# Patient Record
Sex: Female | Born: 1950 | Race: White | Hispanic: No | Marital: Married | Smoking: Never smoker
Health system: Southern US, Community
[De-identification: ages and names within clinical notes are randomized; demographics above are authoritative.]

## PROBLEM LIST (undated history)

## (undated) DIAGNOSIS — M1990 Unspecified osteoarthritis, unspecified site: Secondary | ICD-10-CM

## (undated) DIAGNOSIS — Z8719 Personal history of other diseases of the digestive system: Secondary | ICD-10-CM

## (undated) DIAGNOSIS — Z7901 Long term (current) use of anticoagulants: Secondary | ICD-10-CM

## (undated) DIAGNOSIS — Z9289 Personal history of other medical treatment: Secondary | ICD-10-CM

## (undated) DIAGNOSIS — R42 Dizziness and giddiness: Secondary | ICD-10-CM

## (undated) DIAGNOSIS — M51369 Other intervertebral disc degeneration, lumbar region without mention of lumbar back pain or lower extremity pain: Secondary | ICD-10-CM

## (undated) DIAGNOSIS — M810 Age-related osteoporosis without current pathological fracture: Secondary | ICD-10-CM

## (undated) DIAGNOSIS — I10 Essential (primary) hypertension: Secondary | ICD-10-CM

## (undated) DIAGNOSIS — J189 Pneumonia, unspecified organism: Secondary | ICD-10-CM

## (undated) DIAGNOSIS — Z933 Colostomy status: Secondary | ICD-10-CM

## (undated) DIAGNOSIS — I2699 Other pulmonary embolism without acute cor pulmonale: Secondary | ICD-10-CM

## (undated) DIAGNOSIS — I209 Angina pectoris, unspecified: Secondary | ICD-10-CM

## (undated) DIAGNOSIS — Z22322 Carrier or suspected carrier of Methicillin resistant Staphylococcus aureus: Secondary | ICD-10-CM

## (undated) DIAGNOSIS — K5790 Diverticulosis of intestine, part unspecified, without perforation or abscess without bleeding: Secondary | ICD-10-CM

## (undated) DIAGNOSIS — E119 Type 2 diabetes mellitus without complications: Secondary | ICD-10-CM

## (undated) DIAGNOSIS — E1169 Type 2 diabetes mellitus with other specified complication: Secondary | ICD-10-CM

## (undated) DIAGNOSIS — E669 Obesity, unspecified: Secondary | ICD-10-CM

## (undated) DIAGNOSIS — K5792 Diverticulitis of intestine, part unspecified, without perforation or abscess without bleeding: Secondary | ICD-10-CM

## (undated) DIAGNOSIS — I509 Heart failure, unspecified: Secondary | ICD-10-CM

## (undated) HISTORY — DX: Essential (primary) hypertension: I10

## (undated) HISTORY — PX: CARPAL TUNNEL RELEASE: SHX101

## (undated) HISTORY — PX: KNEE SURGERY: SHX244

---

## 2007-06-15 ENCOUNTER — Emergency Department: Payer: Self-pay | Admitting: Emergency Medicine

## 2007-11-14 ENCOUNTER — Emergency Department: Payer: Self-pay | Admitting: Internal Medicine

## 2008-06-11 ENCOUNTER — Emergency Department: Payer: Self-pay | Admitting: Emergency Medicine

## 2008-06-28 ENCOUNTER — Emergency Department: Payer: Self-pay | Admitting: Emergency Medicine

## 2009-12-29 ENCOUNTER — Ambulatory Visit: Payer: Self-pay | Admitting: Family Medicine

## 2010-04-09 ENCOUNTER — Emergency Department: Payer: Self-pay | Admitting: Emergency Medicine

## 2011-04-19 ENCOUNTER — Emergency Department: Payer: Self-pay | Admitting: *Deleted

## 2011-10-02 ENCOUNTER — Emergency Department: Payer: Self-pay | Admitting: *Deleted

## 2011-10-02 LAB — COMPREHENSIVE METABOLIC PANEL
BUN: 11 mg/dL (ref 7–18)
Chloride: 107 mmol/L (ref 98–107)
Co2: 27 mmol/L (ref 21–32)
Creatinine: 0.92 mg/dL (ref 0.60–1.30)
EGFR (African American): >60
Potassium: 3.7 mmol/L (ref 3.5–5.1)
SGPT (ALT): 88 U/L — ABNORMAL HIGH
Sodium: 142 mmol/L (ref 136–145)
Total Protein: 6.8 g/dL (ref 6.4–8.2)

## 2011-10-02 LAB — URINALYSIS, COMPLETE
Bilirubin,UR: NEGATIVE
Blood: NEGATIVE
Glucose,UR: 50 mg/dL (ref 0–75)
Ketone: NEGATIVE
Nitrite: POSITIVE
Ph: 5 (ref 4.5–8.0)
RBC,UR: 1 /HPF (ref 0–5)
Specific Gravity: 1.014 (ref 1.003–1.030)
Squamous Epithelial: 2

## 2011-10-02 LAB — CBC
HCT: 45 % (ref 35.0–47.0)
MCH: 31.3 pg (ref 26.0–34.0)
Platelet: 138 10*3/uL — ABNORMAL LOW (ref 150–440)
RBC: 4.88 10*6/uL (ref 3.80–5.20)

## 2011-10-02 LAB — TROPONIN I: Troponin-I: <0.02 ng/mL

## 2011-10-02 LAB — CK TOTAL AND CKMB (NOT AT ARMC): CK-MB: <0.5 ng/mL — ABNORMAL LOW (ref 0.5–3.6)

## 2012-01-04 ENCOUNTER — Emergency Department: Payer: Self-pay | Admitting: Emergency Medicine

## 2012-08-19 ENCOUNTER — Emergency Department: Payer: Self-pay | Admitting: Emergency Medicine

## 2012-12-09 ENCOUNTER — Other Ambulatory Visit: Payer: Self-pay | Admitting: Emergency Medicine

## 2012-12-09 LAB — LIPID PANEL
Cholesterol: 136 mg/dL (ref 0–200)
HDL Cholesterol: 35 mg/dL — ABNORMAL LOW (ref 40–60)
Ldl Cholesterol, Calc: 91 mg/dL (ref 0–100)

## 2012-12-09 LAB — COMPREHENSIVE METABOLIC PANEL
BUN: 15 mg/dL (ref 7–18)
Chloride: 109 mmol/L — ABNORMAL HIGH (ref 98–107)
Creatinine: 0.83 mg/dL (ref 0.60–1.30)
EGFR (African American): >60
EGFR (Non-African Amer.): >60
Glucose: 159 mg/dL — ABNORMAL HIGH (ref 65–99)
Osmolality: 284 (ref 275–301)
SGPT (ALT): 79 U/L — ABNORMAL HIGH (ref 12–78)
Sodium: 140 mmol/L (ref 136–145)

## 2012-12-09 LAB — HEMOGLOBIN A1C: Hemoglobin A1C: 7.5 % — ABNORMAL HIGH (ref 4.2–6.3)

## 2012-12-29 ENCOUNTER — Ambulatory Visit: Payer: Self-pay | Admitting: Family Medicine

## 2013-01-21 ENCOUNTER — Emergency Department: Payer: Self-pay | Admitting: Emergency Medicine

## 2013-04-28 ENCOUNTER — Emergency Department: Payer: Self-pay | Admitting: Emergency Medicine

## 2013-04-28 LAB — CBC
HCT: 45.9 % (ref 35.0–47.0)
HGB: 15.2 g/dL (ref 12.0–16.0)
MCH: 30.4 pg (ref 26.0–34.0)
MCV: 92 fL (ref 80–100)
Platelet: 211 10*3/uL (ref 150–440)
RDW: 12.8 % (ref 11.5–14.5)
WBC: 8 10*3/uL (ref 3.6–11.0)

## 2013-04-28 LAB — URINALYSIS, COMPLETE
Bilirubin,UR: NEGATIVE
Blood: NEGATIVE
Glucose,UR: NEGATIVE mg/dL (ref 0–75)
Ph: 7 (ref 4.5–8.0)
Protein: NEGATIVE

## 2013-04-28 LAB — COMPREHENSIVE METABOLIC PANEL
BUN: 16 mg/dL (ref 7–18)
Bilirubin,Total: 0.5 mg/dL (ref 0.2–1.0)
Calcium, Total: 9.2 mg/dL (ref 8.5–10.1)
Chloride: 106 mmol/L (ref 98–107)
Co2: 28 mmol/L (ref 21–32)
Creatinine: 0.86 mg/dL (ref 0.60–1.30)
SGOT(AST): 37 U/L (ref 15–37)
SGPT (ALT): 57 U/L (ref 12–78)
Sodium: 144 mmol/L (ref 136–145)

## 2013-04-28 LAB — TROPONIN I: Troponin-I: <0.02 ng/mL

## 2013-05-29 ENCOUNTER — Observation Stay: Payer: Self-pay | Admitting: Family Medicine

## 2013-05-29 LAB — COMPREHENSIVE METABOLIC PANEL
ANION GAP: 6 — AB (ref 7–16)
Albumin: 3.8 g/dL (ref 3.4–5.0)
Alkaline Phosphatase: 64 U/L
BUN: 17 mg/dL (ref 7–18)
Bilirubin,Total: 0.5 mg/dL (ref 0.2–1.0)
Calcium, Total: 9.4 mg/dL (ref 8.5–10.1)
Chloride: 106 mmol/L (ref 98–107)
Co2: 25 mmol/L (ref 21–32)
Creatinine: 0.96 mg/dL (ref 0.60–1.30)
EGFR (African American): >60
Glucose: 194 mg/dL — ABNORMAL HIGH (ref 65–99)
OSMOLALITY: 281 (ref 275–301)
Potassium: 4.2 mmol/L (ref 3.5–5.1)
SGOT(AST): 33 U/L (ref 15–37)
SGPT (ALT): 54 U/L (ref 12–78)
Sodium: 137 mmol/L (ref 136–145)
Total Protein: 8.2 g/dL (ref 6.4–8.2)

## 2013-05-29 LAB — URINALYSIS, COMPLETE
BLOOD: NEGATIVE
Bilirubin,UR: NEGATIVE
Glucose,UR: NEGATIVE mg/dL (ref 0–75)
KETONE: NEGATIVE
LEUKOCYTE ESTERASE: NEGATIVE
Nitrite: POSITIVE
PH: 6 (ref 4.5–8.0)
Protein: 25
Specific Gravity: 1.015 (ref 1.003–1.030)
Squamous Epithelial: 1
WBC UR: 8 /HPF (ref 0–5)

## 2013-05-29 LAB — CBC WITH DIFFERENTIAL/PLATELET
BASOS ABS: 0 10*3/uL (ref 0.0–0.1)
BASOS PCT: 0.1 %
Eosinophil #: 0.1 10*3/uL (ref 0.0–0.7)
Eosinophil %: 0.8 %
HCT: 49 % — ABNORMAL HIGH (ref 35.0–47.0)
HGB: 16.5 g/dL — ABNORMAL HIGH (ref 12.0–16.0)
Lymphocyte #: 0.4 10*3/uL — ABNORMAL LOW (ref 1.0–3.6)
Lymphocyte %: 3.1 %
MCH: 30.6 pg (ref 26.0–34.0)
MCHC: 33.6 g/dL (ref 32.0–36.0)
MCV: 91 fL (ref 80–100)
MONO ABS: 0.7 x10 3/mm (ref 0.2–0.9)
MONOS PCT: 5.2 %
NEUTROS PCT: 90.8 %
Neutrophil #: 11.7 10*3/uL — ABNORMAL HIGH (ref 1.4–6.5)
PLATELETS: 252 10*3/uL (ref 150–440)
RBC: 5.38 10*6/uL — ABNORMAL HIGH (ref 3.80–5.20)
RDW: 12.7 % (ref 11.5–14.5)
WBC: 12.9 10*3/uL — AB (ref 3.6–11.0)

## 2013-05-29 LAB — LIPASE, BLOOD: Lipase: 152 U/L (ref 73–393)

## 2013-05-30 LAB — CBC WITH DIFFERENTIAL/PLATELET
Basophil #: 0 10*3/uL (ref 0.0–0.1)
Basophil %: 0.2 %
EOS ABS: 0 10*3/uL (ref 0.0–0.7)
EOS PCT: 0.7 %
HCT: 40.8 % (ref 35.0–47.0)
HGB: 13.9 g/dL (ref 12.0–16.0)
LYMPHS PCT: 17.1 %
Lymphocyte #: 0.9 10*3/uL — ABNORMAL LOW (ref 1.0–3.6)
MCH: 31.5 pg (ref 26.0–34.0)
MCHC: 34 g/dL (ref 32.0–36.0)
MCV: 92 fL (ref 80–100)
MONOS PCT: 7.9 %
Monocyte #: 0.4 x10 3/mm (ref 0.2–0.9)
NEUTROS PCT: 74.1 %
Neutrophil #: 4.1 10*3/uL (ref 1.4–6.5)
Platelet: 181 10*3/uL (ref 150–440)
RBC: 4.42 10*6/uL (ref 3.80–5.20)
RDW: 12.7 % (ref 11.5–14.5)
WBC: 5.5 10*3/uL (ref 3.6–11.0)

## 2013-05-30 LAB — BASIC METABOLIC PANEL
ANION GAP: 4 — AB (ref 7–16)
BUN: 11 mg/dL (ref 7–18)
CALCIUM: 7.9 mg/dL — AB (ref 8.5–10.1)
CO2: 27 mmol/L (ref 21–32)
Chloride: 108 mmol/L — ABNORMAL HIGH (ref 98–107)
Creatinine: 0.9 mg/dL (ref 0.60–1.30)
EGFR (African American): >60
Glucose: 98 mg/dL (ref 65–99)
Osmolality: 277 (ref 275–301)
POTASSIUM: 3.6 mmol/L (ref 3.5–5.1)
SODIUM: 139 mmol/L (ref 136–145)

## 2013-05-31 LAB — URINE CULTURE

## 2014-02-14 ENCOUNTER — Ambulatory Visit: Payer: Self-pay | Admitting: Family Medicine

## 2014-02-16 ENCOUNTER — Other Ambulatory Visit: Payer: Self-pay | Admitting: Emergency Medicine

## 2014-02-16 LAB — LIPID PANEL
Cholesterol: 140 mg/dL (ref 0–200)
HDL: 38 mg/dL — AB (ref 40–60)
LDL CHOLESTEROL, CALC: 92 mg/dL (ref 0–100)
Triglycerides: 52 mg/dL (ref 0–200)
VLDL CHOLESTEROL, CALC: 10 mg/dL (ref 5–40)

## 2014-02-16 LAB — HEMOGLOBIN A1C: HEMOGLOBIN A1C: 6.4 % — AB (ref 4.2–6.3)

## 2014-04-17 ENCOUNTER — Ambulatory Visit: Payer: Self-pay | Admitting: Neurosurgery

## 2014-04-20 ENCOUNTER — Ambulatory Visit: Payer: Self-pay | Admitting: Neurosurgery

## 2014-09-14 NOTE — H&P (Signed)
PATIENT NAME:  Kara [name], Kara [name] MR#:  [id number] DATE OF BIRTH:  09-04-1950  DATE OF ADMISSION:  05/29/2013  PRIMARY CARE PHYSICIAN: Dr. Wynetta Emery  REFERRING ER PHYSICIAN: Harvest Dark, MD  CHIEF COMPLAINT: Weakness, diarrhea and vomiting.   HISTORY OF PRESENT ILLNESS: The patient is a 64 year old female with past medical history of diabetes and hypertension who was feeling totally fine until yesterday and today morning around 3:00 in the morning she woke up with severe vomiting and diarrhea. She denies any fever or chills, denies any blood in stool or vomit. She has had a total of 6 to 7 episodes so far and so came to the Emergency Room and found having some UTI also and so came to the Emergency Room. In the ER, she was given symptomatic treatment for diarrhea and vomiting, but did not feel better, not able to tolerate any oral fluid and so hospitalist service contacted. On further questioning, she denies any sick contact, any recent antibiotics or any fever. Says that she is feeling generalized weak now because of repeated diarrhea and vomiting, not able to eat anything since morning.   REVIEW OF SYSTEMS: CONSTITUTIONAL: Negative for fever. Positive for fever and generalized weakness.  EYES: No blurring, double vision, discharge or redness.  EARS, NOSE, THROAT: No tinnitus, ear pain or hearing loss.  RESPIRATORY: No cough, wheezing, hemoptysis or shortness of breath.  CARDIOVASCULAR: No chest pain, orthopnea, edema or arrhythmia.  GASTROINTESTINAL: The patient has nausea, vomiting and diarrhea, but no abdominal pain or blood in the vomit or stool.  GENITOURINARY: No dysuria. No increased frequency of the urine.  ENDOCRINE: No increased sweating. No heat or cold intolerance.  SKIN: No acne, rashes or lesions.  MUSCULOSKELETAL: No pain or swelling in the joints.  NEUROLOGICAL: No numbness, weakness, tremor or vertigo but has generalized weakness.  PSYCHIATRIC: No anxiety, insomnia or bipolar  disorder.   PAST MEDICAL HISTORY: Hypertension, diabetes.   PAST SURGICAL HISTORY: Right knee surgery because of torn ligament 12 years ago.   SOCIAL HISTORY: She is not a smoker, no alcohol drinking and not doing any illegal drug use. Working as a Clinical research associate care person.   FAMILY HISTORY: Mother has diabetes and coronary artery disease.   HOME MEDICATIONS: 1.  Lisinopril 20 mg once a day.  2.  Glipizide 5 mg take 1/2 tablet once a day.  3. Aspirin enteric-coated 81 mg once a day.  PHYSICAL EXAMINATION: VITAL SIGNS: In the ER, temperature 98.6, pulse 102 and went up to 112, respirations 18, blood pressure 149/62 and pulse ox 92 to 93 on room air.  GENERAL: The patient is obese, fully alert and oriented to time, place, and person. Appears slightly sleepy, but opens eyes and talked very nicely. Cooperative with history taking and physical examination.  HEENT: Head and neck atraumatic. Conjunctiva pink. Oral mucosa moist.  NECK: Supple. No JVD.  RESPIRATORY: Bilateral clear and equal air entry.  CARDIOVASCULAR: S1 and S2 present, regular. No murmur.  ABDOMEN: Soft and nontender. Bowel sounds present. No organomegaly.  SKIN: No rashes.  LEGS: No edema.  NEUROLOGIC: Power 5/5. Follows commands. Moves all 4 limbs. No gross abnormality. No costovertebral angle tenderness on the back.  PSYCHIATRIC: Does not appear in any acute psychiatric illness at this time.   IMPORTANT LABORATORY AND DIAGNOSTIC RESULTS: Glucose 194, BUN 17, creatinine 0.96, sodium 137, potassium 4.2, chloride 106, CO2 25. Lipase 152. Calcium 9.4. Total protein 8.2, albumin 3.8, bilirubin 0.5, alkaline phosphate 64, SGOT 33, SGPT  54. WBC 12.9, hemoglobin 16.5, platelet count 252 and MCV 91. Urinalysis is positive with 8 WBC and positive nitrite in the study.   Ultrasound of abdomen is done which showed increased echogenicity consistent with fatty infiltrate in the liver, cholelithiasis without complication factors.    ASSESSMENT AND PLAN: A 64 year old female with past medical history of hypertension and diabetes started having nausea and vomiting and diarrhea since this morning. No abdominal pain or fever. Found having urinary tract infection.  1.  For urinary tract infection, we will give IV Rocephin and will get urine culture.  2.  Gastroenteritis. We will give IV fluids. We will give Zofran and symptomatic support.  3.  Hypertension. We will continue lisinopril as she is taking.  4.  Diabetes. We will continue glipizide and insulin sliding scale coverage.   CODE STATUS: FULL.   TOTAL TIME SPENT ON THIS ADMISSION: 50 minutes.  ___________________________ Ceasar Lund Anselm Jungling, MD vgv:sb D: 05/29/2013 14:54:29 ET T: 05/29/2013 15:07:29 ET JOB#: [id number]  cc: Ceasar Lund. Anselm Jungling, MD, <Dictator> Vaughan Basta MD ELECTRONICALLY SIGNED 05/30/2013 22:22

## 2014-09-14 NOTE — Discharge Summary (Signed)
PATIENT NAME:  Kara [name], Kara [name] MR#:  [id number] DATE OF BIRTH:  [birth year]  DATE OF ADMISSION:  05/29/2013 DATE OF DISCHARGE:  05/30/2013  REASON FOR ADMISSION:  Unable to hold down food or drink secondary to weakness, diarrhea and vomiting.   OTHER MEDICAL PROBLEMS: Include:  1.  Hypertension.  2.  Diabetes.  3.  Obesity.  4.  Chronic cough.   DISPOSITION: Home. Follow up with primary care physician, Dr. Rockne Coons in the next 1 to 2 weeks just as a general followup, or p.r.n. if diarrhea persists.   MEDICATIONS AT DISCHARGE: Aspirin 81 mg daily, glipizide 2.5 mg daily, lisinopril 20 mg daily, promethazine 25 mg every 6 hours as needed for nausea and vomiting, p.r.n. over-the-counter antidiarrheal if needed.   HOSPITAL COURSE: This is a very nice 65 year old female with history of diabetes, hypertension, obesity, who comes after having severe nausea, vomiting and diarrhea starting at 3:00 a.m. the day of admission.   The patient denied any fever or chills, blood in the stool or in the vomit.   The patient had 6 to 7 episodes of vomiting and diarrhea. The diarrhea was just liquid and loose.    The patient was too weak to move around, felt very debilitated, felt very dehydrated, for which the patient was admitted. On admission, it was noticed that she had slight elevation of white blood cells in urine, for which she was started on Rocephin. Urine culture is actually negative, for which she ruled out urinary tract infection; likely this was just contamination from her constant diarrhea.   As far as her gastroenteritis, the patient received IV fluids, Zofran, Phenergan, and now she is better. She had dinner last night, breakfast and lunch today without any further episodes of vomiting. She only had a small episode of loose stool whenever she coughed, but it was not a big bowel movement; it was just small and she feels better right now. The patient has been ambulated without any problem. No  dizziness or lightheadedness. She is ready to be discharged. Her diabetes has been controlled, and again, she is eating without problem.   PHYSICAL EXAMINATION: GENERAL: The patient is and oriented x 3, in no acute distress. No respiratory distress.  VITAL SIGNS:  Blood pressure 108/67, pulse 82, respirations 18, temperature 97.7.  CARDIOVASCULAR: Regular rate and rhythm. No murmurs, rubs or gallops.  LUNGS: Clear without any wheezing or crepitus.  ABDOMEN: Soft, nontender, distended. No hepatosplenomegaly.  EXTREMITIES: No edema, cyanosis or clubbing.   The patient complains of chronic cough that has been going on for several years. She asked for cough syrup.   I recommended to follow up with Dr. Manuella Ghazi, as the patient had been diagnosed with bronchitis a couple of weeks ago. She received treatment with azithromycin, and it seems like the problem persists. Her husband is a heavy smoker, and she seems to be irritated every time that he smokes and she gets more shortness of breath and cough. She could be having COPD due to secondary smoking, or this could be the result of the use of lisinopril as a side effect of cough.   Please follow up with your primary care physician and see if he can change your medication.   I spent about 35 minutes with this discharge.    ____________________________ Angie Sink, MD rsg:dmm D: 05/30/2013 14:31:20 ET T: 05/30/2013 19:24:08 ET JOB#: [id number]  cc: Deloit Sink, MD, <Dictator> [place] MD ELECTRONICALLY SIGNED 06/17/2013 8:08

## 2014-11-06 ENCOUNTER — Ambulatory Visit: Payer: Self-pay | Admitting: Family Medicine

## 2014-11-07 ENCOUNTER — Ambulatory Visit: Payer: Self-pay | Admitting: Family Medicine

## 2014-11-12 ENCOUNTER — Ambulatory Visit (INDEPENDENT_AMBULATORY_CARE_PROVIDER_SITE_OTHER): Payer: 59 | Admitting: Family Medicine

## 2014-11-12 ENCOUNTER — Encounter: Payer: Self-pay | Admitting: Family Medicine

## 2014-11-12 VITALS — BP 130/78 | HR 102 | Temp 98.0°F | Ht 63.0 in | Wt 256.6 lb

## 2014-11-12 DIAGNOSIS — E119 Type 2 diabetes mellitus without complications: Secondary | ICD-10-CM

## 2014-11-12 DIAGNOSIS — E1165 Type 2 diabetes mellitus with hyperglycemia: Secondary | ICD-10-CM | POA: Insufficient documentation

## 2014-11-12 DIAGNOSIS — I10 Essential (primary) hypertension: Secondary | ICD-10-CM | POA: Diagnosis not present

## 2014-11-12 DIAGNOSIS — E669 Obesity, unspecified: Secondary | ICD-10-CM

## 2014-11-12 DIAGNOSIS — E1169 Type 2 diabetes mellitus with other specified complication: Secondary | ICD-10-CM | POA: Insufficient documentation

## 2014-11-12 MED ORDER — LISINOPRIL 20 MG PO TABS: 20.0000 mg | ORAL_TABLET | Freq: Every day | ORAL | Status: DC

## 2014-11-12 MED ORDER — GLIPIZIDE 5 MG PO TABS: 2.5000 mg | ORAL_TABLET | Freq: Every day | ORAL | Status: DC

## 2014-11-12 NOTE — Progress Notes (Signed)
Name: Kara [name]   MRN: [id number]    DOB: 1950-07-17   Date:11/12/2014       Progress Note  Subjective  Chief Complaint  Chief Complaint  Patient presents with  . Follow-up    medication refills    Diabetes She presents for her follow-up diabetic visit. She has type 2 diabetes mellitus. Her disease course has been stable. Associated symptoms include polydipsia and polyuria. Pertinent negatives for diabetes include no chest pain, no fatigue and no foot paresthesias. Symptoms are stable. Pertinent negatives for diabetic complications include no autonomic neuropathy. Risk factors for coronary artery disease include diabetes mellitus and obesity. Current diabetic treatment includes oral agent (monotherapy). She is following a diabetic and generally healthy diet. She rarely participates in exercise. Her breakfast blood glucose is taken between 6-7 am. Her breakfast blood glucose range is generally 110-130 mg/dl. An ACE inhibitor/angiotensin II receptor blocker is being taken.  Hypertension This is a chronic problem. The problem is controlled. Pertinent negatives include no chest pain, palpitations or shortness of breath. Past treatments include ACE inhibitors. The current treatment provides significant improvement. Compliance problems include diet.  There is no history of angina or kidney disease.      Past Medical History  Diagnosis Date  . Diabetes mellitus without complication   . Hypertension     History reviewed. No pertinent past surgical history.  Family History  Problem Relation Age of Onset  . Diabetes Mother   . Heart disease Mother   . Cancer Mother   . Heart disease Father   . Diabetes Sister   . Heart disease Sister     History   Social History  . Marital Status: Married    Spouse Name: N/A  . Number of Children: N/A  . Years of Education: N/A   Occupational History  . Not on file.   Social History Main Topics  . Smoking status: Never Smoker   .  Smokeless tobacco: Not on file  . Alcohol Use: No  . Drug Use: No  . Sexual Activity: Not on file   Other Topics Concern  . Not on file   Social History Narrative  . No narrative on file     Current outpatient prescriptions:  .  aspirin EC 81 MG tablet, Take 81 mg by mouth daily., Disp: , Rfl:  .  Cinnamon 500 MG capsule, Take 500 mg by mouth daily., Disp: , Rfl:  .  glipiZIDE (GLUCOTROL) 5 MG tablet, Take 0.5 tablets (2.5 mg total) by mouth daily before breakfast., Disp: 90 tablet, Rfl: 0 .  lisinopril (PRINIVIL,ZESTRIL) 20 MG tablet, Take 1 tablet (20 mg total) by mouth daily., Disp: 90 tablet, Rfl: 0 .  MULTIPLE VITAMIN IV, Take by mouth., Disp: , Rfl:   Not on File   Review of Systems  Constitutional: Negative for fatigue.  Respiratory: Negative for shortness of breath.   Cardiovascular: Negative for chest pain and palpitations.  Endo/Heme/Allergies: Positive for polydipsia.      Objective  Filed Vitals:   11/12/14 1613  BP: 130/78  Pulse: 102  Temp: 98 F (36.7 C)  TempSrc: Oral  Height: '5\' 3"'$  (1.6 m)  Weight: 256 lb 9.6 oz (116.393 kg)  SpO2: 97%    Physical Exam  Constitutional: She is oriented to person, place, and time and well-developed, well-nourished, and in no distress.  HENT:  Head: Normocephalic and atraumatic.  Cardiovascular: Normal rate and regular rhythm.   Pulmonary/Chest: Effort normal and breath sounds  normal.  Musculoskeletal:       Right ankle: She exhibits swelling and ecchymosis.       Left ankle: She exhibits no swelling.  Neurological: She is alert and oriented to person, place, and time.  Psychiatric: Affect normal.  Nursing note and vitals reviewed.      No results found for this or any previous visit (from the past 2160 hour(s)).   Assessment & Plan 1. Diabetes mellitus type 2 in obese  - HgB A1c - Lipid panel - Comprehensive metabolic panel - glipiZIDE (GLUCOTROL) 5 MG tablet; Take 0.5 tablets (2.5 mg total) by  mouth daily before breakfast.  Dispense: 90 tablet; Refill: 0  2. Essential hypertension  - lisinopril (PRINIVIL,ZESTRIL) 20 MG tablet; Take 1 tablet (20 mg total) by mouth daily.  Dispense: 90 tablet; Refill: 0   Amareon Phung [facility] 11/12/2014 4:42 PM

## 2014-11-18 ENCOUNTER — Other Ambulatory Visit
Admission: RE | Admit: 2014-11-18 | Discharge: 2014-11-18 | Disposition: A | Discharge status: Home / Self Care | Payer: 59 | Source: Ambulatory Visit | Attending: Family Medicine | Admitting: Family Medicine

## 2014-11-18 DIAGNOSIS — E119 Type 2 diabetes mellitus without complications: Secondary | ICD-10-CM | POA: Insufficient documentation

## 2014-11-18 LAB — COMPREHENSIVE METABOLIC PANEL
ALK PHOS: 51 U/L (ref 38–126)
ALT: 28 U/L (ref 14–54)
AST: 21 U/L (ref 15–41)
Albumin: 3.7 g/dL (ref 3.5–5.0)
Anion gap: 7 (ref 5–15)
BUN: 17 mg/dL (ref 6–20)
CO2: 26 mmol/L (ref 22–32)
CREATININE: 0.65 mg/dL (ref 0.44–1.00)
Calcium: 8.8 mg/dL — ABNORMAL LOW (ref 8.9–10.3)
Chloride: 107 mmol/L (ref 101–111)
GFR calc non Af Amer: >60 mL/min (ref >60)
GLUCOSE: 152 mg/dL — AB (ref 65–99)
POTASSIUM: 4.1 mmol/L (ref 3.5–5.1)
Sodium: 140 mmol/L (ref 135–145)
Total Bilirubin: 0.7 mg/dL (ref 0.3–1.2)
Total Protein: 7 g/dL (ref 6.5–8.1)

## 2014-11-18 LAB — LIPID PANEL
CHOLESTEROL: 132 mg/dL (ref 0–200)
HDL: 44 mg/dL (ref >40)
LDL CALC: 82 mg/dL (ref 0–99)
Total CHOL/HDL Ratio: 3 RATIO
Triglycerides: 29 mg/dL (ref <150)
VLDL: 6 mg/dL (ref 0–40)

## 2014-11-19 ENCOUNTER — Telehealth: Payer: Self-pay | Admitting: Family Medicine

## 2014-11-19 LAB — HEMOGLOBIN A1C: Hgb A1c MFr Bld: 6.6 % — ABNORMAL HIGH (ref 4.0–6.0)

## 2014-11-19 NOTE — Telephone Encounter (Signed)
PT IS ASKING FOR HER LAB RESULTS SHE HAD DONE A DAY OR 2 AGO. PLEASE CALL.

## 2014-11-19 NOTE — Progress Notes (Signed)
Called Pt, LM of lab results.

## 2014-11-22 NOTE — Telephone Encounter (Signed)
Called and LM with the normal labs for the second time.

## 2014-12-03 ENCOUNTER — Ambulatory Visit (INDEPENDENT_AMBULATORY_CARE_PROVIDER_SITE_OTHER): Payer: 59 | Admitting: Family Medicine

## 2014-12-03 ENCOUNTER — Encounter: Payer: Self-pay | Admitting: Family Medicine

## 2014-12-03 VITALS — BP 126/84 | HR 110 | Temp 99.0°F | Resp 18 | Ht 63.0 in | Wt 253.0 lb

## 2014-12-03 DIAGNOSIS — J209 Acute bronchitis, unspecified: Secondary | ICD-10-CM | POA: Insufficient documentation

## 2014-12-03 MED ORDER — GUAIFENESIN-CODEINE 100-10 MG/5ML PO SYRP: 10.0000 mL | ORAL_SOLUTION | Freq: Four times a day (QID) | ORAL | Status: DC | PRN

## 2014-12-03 MED ORDER — AZITHROMYCIN 250 MG PO TABS: ORAL_TABLET | ORAL | Status: DC

## 2014-12-03 NOTE — Progress Notes (Signed)
Name: Kara [name]   MRN: [id number]    DOB: [birth year]   Date:12/03/2014       Progress Note  Subjective  Chief Complaint  Chief Complaint  Patient presents with  . Cough    intense cough since yesterday, low grade fever,headache, and SOB    Cough This is a new problem. The current episode started yesterday. The problem has been gradually worsening. The cough is non-productive. Associated symptoms include chills, headaches and wheezing. Pertinent negatives include no chest pain, ear pain, fever, hemoptysis, nasal congestion, sore throat or shortness of breath. She has tried nothing for the symptoms. There is no history of COPD, emphysema, environmental allergies or pneumonia.      Past Medical History  Diagnosis Date  . Diabetes mellitus without complication   . Hypertension     Past Surgical History  Procedure Laterality Date  . Knee surgery      torn menicus  . Carpal tunnel release Bilateral     Family History  Problem Relation Age of Onset  . Diabetes Mother   . Heart disease Mother   . Cancer Mother   . Heart disease Father   . Diabetes Sister   . Heart disease Sister     History   Social History  . Marital Status: Married    Spouse Name: N/A  . Number of Children: N/A  . Years of Education: N/A   Occupational History  . Not on file.   Social History Main Topics  . Smoking status: Never Smoker   . Smokeless tobacco: Never Used  . Alcohol Use: No  . Drug Use: No  . Sexual Activity: No   Other Topics Concern  . Not on file   Social History Narrative     Current outpatient prescriptions:  .  aspirin EC 81 MG tablet, Take 81 mg by mouth daily., Disp: , Rfl:  .  Cinnamon 500 MG capsule, Take 500 mg by mouth daily., Disp: , Rfl:  .  glipiZIDE (GLUCOTROL) 5 MG tablet, Take 0.5 tablets (2.5 mg total) by mouth daily before breakfast., Disp: 90 tablet, Rfl: 0 .  lisinopril (PRINIVIL,ZESTRIL) 20 MG tablet, Take 1 tablet (20 mg total) by mouth  daily., Disp: 90 tablet, Rfl: 0 .  MULTIPLE VITAMIN IV, Take by mouth., Disp: , Rfl:   No Known Allergies   Review of Systems  Constitutional: Positive for chills. Negative for fever.  HENT: Negative for congestion, ear pain and sore throat.   Respiratory: Positive for cough and wheezing. Negative for hemoptysis and shortness of breath.   Cardiovascular: Negative for chest pain.  Neurological: Positive for headaches.  Endo/Heme/Allergies: Negative for environmental allergies.      Objective  Filed Vitals:   12/03/14 1023  BP: 126/84  Pulse: 110  Temp: 99 F (37.2 C)  TempSrc: Oral  Resp: 18  Height: '5\' 3"'$  (1.6 m)  Weight: 253 lb (114.76 kg)  SpO2: 96%    Physical Exam  Constitutional: She is well-developed, well-nourished, and in no distress.  HENT:  Head: Normocephalic and atraumatic.  Right Ear: External ear and ear canal normal.  Left Ear: Tympanic membrane, external ear and ear canal normal.  Nose: Mucosal edema and rhinorrhea present.  Mouth/Throat: Posterior oropharyngeal erythema present. No oropharyngeal exudate or posterior oropharyngeal edema.  Cardiovascular: Normal rate and regular rhythm.   Pulmonary/Chest: Effort normal and breath sounds normal.  Nursing note and vitals reviewed.   Assessment & Plan 1. Acute bronchitis, unspecified organism  Daily bronchitis, early onset. Recommended conservative treatment but provided a prescription for Z-Pak if her symptoms are not improving within 2-3 days, she is to start on the antibiotics. Patient verbalized understanding. - guaiFENesin-codeine (ROBITUSSIN AC) 100-10 MG/5ML syrup; Take 10 mLs by mouth 4 (four) times daily as needed for cough.  Dispense: 200 mL; Refill: 0 - azithromycin (ZITHROMAX Z-PAK) 250 MG tablet; 2 tabs po x day 1, then 1 tab po qday x 4 days.  Dispense: 6 each; Refill: 0   Kaelen Brennan [facility] 12/03/2014 10:53 AM

## 2014-12-09 ENCOUNTER — Telehealth: Payer: Self-pay | Admitting: Family Medicine

## 2014-12-09 DIAGNOSIS — J209 Acute bronchitis, unspecified: Secondary | ICD-10-CM

## 2014-12-09 MED ORDER — HYDROCOD POLST-CPM POLST ER 10-8 MG/5ML PO SUER: 5.0000 mL | Freq: Every evening | ORAL | Status: DC | PRN

## 2014-12-09 MED ORDER — PREDNISONE 10 MG PO TABS: 10.0000 mg | ORAL_TABLET | Freq: Every day | ORAL | Status: DC

## 2014-12-09 NOTE — Telephone Encounter (Signed)
Patient was seen on 12/03/2014 she was prescribed a Zpack patient states it gave her diarrhea so she stopped taking it and is now requesting Tussinex and Predinsone be sent to [facility]. Routed to Dr. Manuella Ghazi for review and approval.

## 2014-12-09 NOTE — Telephone Encounter (Signed)
Patient started having diarrhea after 2 days of Z-Pak so she discontinued taking it. She still has persistent coughing despite taking Tylenol with codeine. We will start patient on Tussionex and prednisone 50 mg tapering dose over 5 days.

## 2014-12-09 NOTE — Telephone Encounter (Signed)
Was seen on last Tuesday but the zpack gave diarrhea so she stopped taking it. Now patient is requesting Tussinex and predinsone. Patient is still coughing at night. Please send to [facility]. Patient requesting that you call her once this is completed.

## 2014-12-10 ENCOUNTER — Telehealth: Payer: Self-pay | Admitting: Family Medicine

## 2014-12-10 NOTE — Telephone Encounter (Signed)
Spoke with patient to let her know prescription is here and ready for pickup, patient stated that her husband has an appointment today and he will pick up for her, his name is Juan Tavis.

## 2014-12-29 ENCOUNTER — Emergency Department
Admission: EM | Admit: 2014-12-29 | Discharge: 2014-12-29 | Disposition: A | Discharge status: Home / Self Care | Payer: 59 | Attending: Emergency Medicine | Admitting: Emergency Medicine

## 2014-12-29 ENCOUNTER — Encounter: Payer: Self-pay | Admitting: Emergency Medicine

## 2014-12-29 DIAGNOSIS — Y9389 Activity, other specified: Secondary | ICD-10-CM | POA: Insufficient documentation

## 2014-12-29 DIAGNOSIS — Y9289 Other specified places as the place of occurrence of the external cause: Secondary | ICD-10-CM | POA: Insufficient documentation

## 2014-12-29 DIAGNOSIS — Y998 Other external cause status: Secondary | ICD-10-CM | POA: Insufficient documentation

## 2014-12-29 DIAGNOSIS — Z791 Long term (current) use of non-steroidal anti-inflammatories (NSAID): Secondary | ICD-10-CM | POA: Diagnosis not present

## 2014-12-29 DIAGNOSIS — E119 Type 2 diabetes mellitus without complications: Secondary | ICD-10-CM | POA: Diagnosis not present

## 2014-12-29 DIAGNOSIS — Z7982 Long term (current) use of aspirin: Secondary | ICD-10-CM | POA: Diagnosis not present

## 2014-12-29 DIAGNOSIS — I10 Essential (primary) hypertension: Secondary | ICD-10-CM | POA: Insufficient documentation

## 2014-12-29 DIAGNOSIS — Z79899 Other long term (current) drug therapy: Secondary | ICD-10-CM | POA: Diagnosis not present

## 2014-12-29 DIAGNOSIS — S80921A Unspecified superficial injury of right lower leg, initial encounter: Secondary | ICD-10-CM | POA: Diagnosis present

## 2014-12-29 DIAGNOSIS — T148XXA Other injury of unspecified body region, initial encounter: Secondary | ICD-10-CM

## 2014-12-29 DIAGNOSIS — S8491XA Injury of unspecified nerve at lower leg level, right leg, initial encounter: Secondary | ICD-10-CM | POA: Diagnosis not present
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
## 2014-12-29 NOTE — Discharge Instructions (Signed)
Contusion °A contusion is a deep bruise. Contusions are the result of an injury that caused bleeding under the skin. The contusion may turn blue, purple, or yellow. Minor injuries will give you a painless contusion, but more severe contusions may stay painful and swollen for a few weeks.  °CAUSES  °A contusion is usually caused by a blow, trauma, or direct force to an area of the body. °SYMPTOMS  °· Swelling and redness of the injured area. °· Bruising of the injured area. °· Tenderness and soreness of the injured area. °· Pain. °DIAGNOSIS  °The diagnosis can be made by taking a history and physical exam. An X-ray, CT scan, or MRI may be needed to determine if there were any associated injuries, such as fractures. °TREATMENT  °Specific treatment will depend on what area of the body was injured. In general, the best treatment for a contusion is resting, icing, elevating, and applying cold compresses to the injured area. Over-the-counter medicines may also be recommended for pain control. Ask your caregiver what the best treatment is for your contusion. °HOME CARE INSTRUCTIONS  °· Put ice on the injured area. °¨ Put ice in a plastic bag. °¨ Place a towel between your skin and the bag. °¨ Leave the ice on for 15-20 minutes, 3-4 times a day, or as directed by your health care provider. °· Only take over-the-counter or prescription medicines for pain, discomfort, or fever as directed by your caregiver. Your caregiver may recommend avoiding anti-inflammatory medicines (aspirin, ibuprofen, and naproxen) for 48 hours because these medicines may increase bruising. °· Rest the injured area. °· If possible, elevate the injured area to reduce swelling. °SEEK IMMEDIATE MEDICAL CARE IF:  °· You have increased bruising or swelling. °· You have pain that is getting worse. °· Your swelling or pain is not relieved with medicines. °MAKE SURE YOU:  °· Understand these instructions. °· Will watch your condition. °· Will get help right  away if you are not doing well or get worse. °Document Released: 02/17/2005 Document Revised: 05/15/2013 Document Reviewed: 03/15/2011 °[employer]® Patient Information ©2015 [employer]. This information is not intended to replace advice given to you by your health care provider. Make sure you discuss any questions you have with your health care provider. ° °
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
## 2014-12-29 NOTE — ED Notes (Signed)
MD at bedside with RN.

## 2015-02-04 ENCOUNTER — Telehealth: Payer: Self-pay | Admitting: Family Medicine

## 2015-02-04 DIAGNOSIS — I10 Essential (primary) hypertension: Secondary | ICD-10-CM

## 2015-02-04 MED ORDER — LISINOPRIL 20 MG PO TABS: 20.0000 mg | ORAL_TABLET | Freq: Every day | ORAL | Status: DC

## 2015-02-04 NOTE — Telephone Encounter (Signed)
Medication has been refilled and sent to [place]

## 2015-02-12 ENCOUNTER — Ambulatory Visit: Payer: 59 | Admitting: Family Medicine

## 2015-02-20 ENCOUNTER — Ambulatory Visit (INDEPENDENT_AMBULATORY_CARE_PROVIDER_SITE_OTHER): Payer: 59 | Admitting: Family Medicine

## 2015-02-20 ENCOUNTER — Encounter: Payer: Self-pay | Admitting: Family Medicine

## 2015-02-20 VITALS — BP 140/82 | HR 120 | Temp 97.7°F | Resp 20 | Ht 63.0 in | Wt 258.4 lb

## 2015-02-20 DIAGNOSIS — G4762 Sleep related leg cramps: Secondary | ICD-10-CM

## 2015-02-20 DIAGNOSIS — E119 Type 2 diabetes mellitus without complications: Secondary | ICD-10-CM

## 2015-02-20 DIAGNOSIS — I10 Essential (primary) hypertension: Secondary | ICD-10-CM

## 2015-02-20 LAB — POCT GLYCOSYLATED HEMOGLOBIN (HGB A1C): Hemoglobin A1C: 6.6

## 2015-02-20 MED ORDER — LISINOPRIL 20 MG PO TABS: 20.0000 mg | ORAL_TABLET | Freq: Every day | ORAL | Status: DC

## 2015-02-20 NOTE — Progress Notes (Signed)
Name: Kara [name]   MRN: [id number]    DOB: 1951/02/28   Date:02/20/2015       Progress Note  Subjective  Chief Complaint  Chief Complaint  Patient presents with  . Follow-up    3 mo  . Diabetes    Diabetes She presents for her follow-up diabetic visit. She has type 2 diabetes mellitus. Her disease course has been stable. Hypoglycemia symptoms include dizziness, nervousness/anxiousness and sweats. Pertinent negatives for hypoglycemia include no headaches. Pertinent negatives for diabetes include no blurred vision, no fatigue, no foot paresthesias, no polydipsia and no polyuria. There are no hypoglycemic complications. Diabetic complications include autonomic neuropathy. Pertinent negatives for diabetic complications include no CVA. Her weight is stable. Her breakfast blood glucose range is generally 110-130 mg/dl.  Hypertension This is a chronic problem. The problem is unchanged. The problem is controlled. Associated symptoms include sweats. Pertinent negatives include no blurred vision, headaches or palpitations. Past treatments include ACE inhibitors. There is no history of angina, CAD/MI or CVA.    Past Medical History  Diagnosis Date  . Diabetes mellitus without complication   . Hypertension     Past Surgical History  Procedure Laterality Date  . Knee surgery      torn menicus  . Carpal tunnel release Bilateral     Family History  Problem Relation Age of Onset  . Diabetes Mother   . Heart disease Mother   . Cancer Mother   . Heart disease Father   . Diabetes Sister   . Heart disease Sister     Social History   Social History  . Marital Status: Married    Spouse Name: N/A  . Number of Children: N/A  . Years of Education: N/A   Occupational History  . Not on file.   Social History Main Topics  . Smoking status: Never Smoker   . Smokeless tobacco: Never Used  . Alcohol Use: No  . Drug Use: No  . Sexual Activity: No   Other Topics Concern  . Not  on file   Social History Narrative    Current outpatient prescriptions:  .  aspirin EC 81 MG tablet, Take 81 mg by mouth daily., Disp: , Rfl:  .  azithromycin (ZITHROMAX Z-PAK) 250 MG tablet, 2 tabs po x day 1, then 1 tab po qday x 4 days., Disp: 6 each, Rfl: 0 .  chlorpheniramine-HYDROcodone (TUSSIONEX PENNKINETIC ER) 10-8 MG/5ML SUER, Take 5 mLs by mouth at bedtime as needed for cough., Disp: 25 mL, Rfl: 0 .  Cinnamon 500 MG capsule, Take 500 mg by mouth daily., Disp: , Rfl:  .  glipiZIDE (GLUCOTROL) 5 MG tablet, Take 0.5 tablets (2.5 mg total) by mouth daily before breakfast., Disp: 90 tablet, Rfl: 0 .  guaiFENesin-codeine (ROBITUSSIN AC) 100-10 MG/5ML syrup, Take 10 mLs by mouth 4 (four) times daily as needed for cough., Disp: 200 mL, Rfl: 0 .  lisinopril (PRINIVIL,ZESTRIL) 20 MG tablet, Take 1 tablet (20 mg total) by mouth daily., Disp: 30 tablet, Rfl: 0 .  MULTIPLE VITAMIN IV, Take by mouth., Disp: , Rfl:  .  predniSONE (DELTASONE) 10 MG tablet, Take 1 tablet (10 mg total) by mouth daily with breakfast. 50 40 '30 20 10 '$ then STOP, Disp: 15 tablet, Rfl: 0  No Known Allergies   Review of Systems  Constitutional: Negative for fatigue.  Eyes: Negative for blurred vision.  Cardiovascular: Negative for palpitations.  Musculoskeletal: Positive for myalgias.  Neurological: Positive for dizziness. Negative for  headaches.  Endo/Heme/Allergies: Negative for polydipsia.  Psychiatric/Behavioral: The patient is nervous/anxious.     Objective  Filed Vitals:   02/20/15 1622  BP: 140/82  Pulse: 120  Temp: 97.7 F (36.5 C)  TempSrc: Oral  Resp: 20  Height: '5\' 3"'$  (1.6 m)  Weight: 258 lb 6.4 oz (117.209 kg)  SpO2: 96%    Physical Exam  Constitutional: She is oriented to person, place, and time and well-developed, well-nourished, and in no distress.  Cardiovascular: Normal rate and regular rhythm.   Pulmonary/Chest: Effort normal and breath sounds normal.  Musculoskeletal:       Right  lower leg: She exhibits tenderness. She exhibits no swelling and no edema.  Neurological: She is alert and oriented to person, place, and time.  Nursing note and vitals reviewed.   Recent Results (from the past 2160 hour(s))  POCT HgB A1C     Status: Normal   Collection Time: 02/20/15  4:29 PM  Result Value Ref Range   Hemoglobin A1C 6.6    Assessment & Plan  1. Type 2 diabetes mellitus without complication  - POCT HgB A1C  2. Essential hypertension  - lisinopril (PRINIVIL,ZESTRIL) 20 MG tablet; Take 1 tablet (20 mg total) by mouth daily.  Dispense: 90 tablet; Refill: 0  3. Nocturnal leg cramps  - Comprehensive Metabolic Panel (CMET) - Magnesium    Syed Asad A[facility] 02/20/2015 5:25 PM

## 2015-02-25 ENCOUNTER — Telehealth: Payer: Self-pay | Admitting: Family Medicine

## 2015-02-25 NOTE — Telephone Encounter (Signed)
Returned patient's call and results have been reported

## 2015-02-25 NOTE — Telephone Encounter (Signed)
Patient received call yesterday and missed the call. Not sure what the call was about

## 2015-03-17 ENCOUNTER — Other Ambulatory Visit
Admission: RE | Admit: 2015-03-17 | Discharge: 2015-03-17 | Disposition: A | Discharge status: Home / Self Care | Payer: 59 | Source: Ambulatory Visit | Attending: Family Medicine | Admitting: Family Medicine

## 2015-03-17 ENCOUNTER — Telehealth: Payer: Self-pay | Admitting: Family Medicine

## 2015-03-17 DIAGNOSIS — M5416 Radiculopathy, lumbar region: Secondary | ICD-10-CM

## 2015-03-17 DIAGNOSIS — G4762 Sleep related leg cramps: Secondary | ICD-10-CM | POA: Insufficient documentation

## 2015-03-17 LAB — COMPREHENSIVE METABOLIC PANEL
ALT: 37 U/L (ref 14–54)
ANION GAP: 7 (ref 5–15)
AST: 24 U/L (ref 15–41)
Albumin: 4.1 g/dL (ref 3.5–5.0)
Alkaline Phosphatase: 47 U/L (ref 38–126)
BILIRUBIN TOTAL: 0.8 mg/dL (ref 0.3–1.2)
BUN: 17 mg/dL (ref 6–20)
CALCIUM: 8.9 mg/dL (ref 8.9–10.3)
CO2: 24 mmol/L (ref 22–32)
CREATININE: 0.76 mg/dL (ref 0.44–1.00)
Chloride: 111 mmol/L (ref 101–111)
GFR calc non Af Amer: >60 mL/min (ref >60)
Glucose, Bld: 123 mg/dL — ABNORMAL HIGH (ref 65–99)
Potassium: 3.8 mmol/L (ref 3.5–5.1)
SODIUM: 142 mmol/L (ref 135–145)
TOTAL PROTEIN: 7 g/dL (ref 6.5–8.1)

## 2015-03-17 LAB — MAGNESIUM: MAGNESIUM: 2.1 mg/dL (ref 1.7–2.4)

## 2015-03-17 NOTE — Telephone Encounter (Signed)
Pt would like a call back please.

## 2015-03-18 MED ORDER — PREDNISONE 10 MG PO TABS: 10.0000 mg | ORAL_TABLET | Freq: Every day | ORAL | Status: DC

## 2015-03-18 NOTE — Telephone Encounter (Signed)
Patient has pain in her back that is going down her leg. Would like something to help relieve the pain that is going down her leg because Dr Jerene Bears is out of town and will not be back until the 5th of November. She is just needing a little help.

## 2015-03-18 NOTE — Telephone Encounter (Signed)
Patient has back pain radiating down to her right leg. In the past she has received cortisone injections which have helped significantly. Her orthopedist is currently out of town. She is requesting something to relieve the pain until she is able to see her orthopedist. We will prescribe prednisone 50 mg and patient will taper over 5 days. Advised to check her sugars while on prednisone.

## 2015-03-20 NOTE — Telephone Encounter (Signed)
Medication has been sent to pharmacy on 03/18/2015 per Dr. Manuella Ghazi

## 2015-03-26 ENCOUNTER — Other Ambulatory Visit: Payer: Self-pay | Admitting: Family Medicine

## 2015-03-26 DIAGNOSIS — M5416 Radiculopathy, lumbar region: Secondary | ICD-10-CM

## 2015-03-26 MED ORDER — PREDNISONE 10 MG PO TABS: 10.0000 mg | ORAL_TABLET | Freq: Every day | ORAL | Status: DC

## 2015-03-26 NOTE — Telephone Encounter (Signed)
Patient reports her injection is not until November 17 and hip has started bothering her again. She is requesting prednisone until her appointment for injection. We will approve one more round of tapering dose of prednisone.

## 2015-03-26 NOTE — Telephone Encounter (Signed)
Routed to Dr. Shah for approval 

## 2015-03-26 NOTE — Telephone Encounter (Signed)
Pt states she would like a refill on her prednisone. She is scheduled to see Dr Sherwood Gambler on 04/10/15 for her injection. [place]

## 2015-05-26 ENCOUNTER — Encounter: Payer: Self-pay | Admitting: Emergency Medicine

## 2015-05-26 ENCOUNTER — Emergency Department
Admission: EM | Admit: 2015-05-26 | Discharge: 2015-05-26 | Disposition: A | Discharge status: Home / Self Care | Payer: 59 | Attending: Emergency Medicine | Admitting: Emergency Medicine

## 2015-05-26 DIAGNOSIS — I10 Essential (primary) hypertension: Secondary | ICD-10-CM | POA: Insufficient documentation

## 2015-05-26 DIAGNOSIS — E119 Type 2 diabetes mellitus without complications: Secondary | ICD-10-CM | POA: Diagnosis not present

## 2015-05-26 DIAGNOSIS — J209 Acute bronchitis, unspecified: Secondary | ICD-10-CM

## 2015-05-26 DIAGNOSIS — Z79899 Other long term (current) drug therapy: Secondary | ICD-10-CM | POA: Diagnosis not present

## 2015-05-26 DIAGNOSIS — Z7982 Long term (current) use of aspirin: Secondary | ICD-10-CM | POA: Diagnosis not present

## 2015-05-26 DIAGNOSIS — R05 Cough: Secondary | ICD-10-CM | POA: Diagnosis present

## 2015-05-26 MED ORDER — FLUTICASONE PROPIONATE 50 MCG/ACT NA SUSP: 1.0000 | Freq: Two times a day (BID) | NASAL | Status: DC

## 2015-05-26 MED ORDER — PREDNISONE 10 MG PO TABS: 10.0000 mg | ORAL_TABLET | ORAL | Status: DC

## 2015-05-26 MED ORDER — HYDROCOD POLST-CPM POLST ER 10-8 MG/5ML PO SUER: 5.0000 mL | Freq: Two times a day (BID) | ORAL | Status: DC

## 2015-05-26 MED ORDER — ALBUTEROL SULFATE HFA 108 (90 BASE) MCG/ACT IN AERS: 2.0000 | INHALATION_SPRAY | RESPIRATORY_TRACT | Status: DC | PRN

## 2015-05-26 NOTE — ED Notes (Signed)
Pt presents to ED with cough, congestion that started yesterday. Pt states she has some chills but has been taking tylenol off and on. Occasionally has body aches , denies n/v. Chest hurts due to coughing so much.

## 2015-05-26 NOTE — ED Provider Notes (Signed)
[facility] Emergency Department Provider Note ?  ? ____________________________________________ ? Time seen: 4:14 PM ? I have reviewed the triage vital signs and the nursing notes.  ________ HISTORY ? Chief Complaint Cough and Nasal Congestion     HPI  Kara [name] is a 65 y.o. female   who presents emergency department complaining of nasal congestion and cough that began yesterday. Patient states that she has a dry cough that is "really bothering me." Patient states that she is call somewhat she has some rib pain. But she denies any chest pain. She does endorse some mild shortness of breath after coughing spells but generally didn't climb to any shortness of breath. She denies any fevers or chills. She denies any headache, neck pain, nausea or vomiting. ? ? ? Past Medical History  Diagnosis Date  . Diabetes mellitus without complication ([place])   . Hypertension     Patient Active Problem List   Diagnosis Date Noted  . Nocturnal leg cramps 02/20/2015  . Diabetes mellitus type 2 in obese ([place]) 11/12/2014  . BP (high blood pressure) 11/12/2014   ? Past Surgical History  Procedure Laterality Date  . Knee surgery      torn menicus  . Carpal tunnel release Bilateral    ? Current Outpatient Rx  Name  Route  Sig  Dispense  Refill  . albuterol (PROVENTIL HFA;VENTOLIN HFA) 108 (90 Base) MCG/ACT inhaler   Inhalation   Inhale 2 puffs into the lungs every 4 (four) hours as needed for wheezing or shortness of breath.   1 Inhaler   0   . aspirin EC 81 MG tablet   Oral   Take 81 mg by mouth daily.         . chlorpheniramine-HYDROcodone (TUSSIONEX PENNKINETIC ER) 10-8 MG/5ML SUER   Oral   Take 5 mLs by mouth 2 (two) times daily.   140 mL   0   . Cinnamon 500 MG capsule   Oral   Take 500 mg by mouth daily.         . fluticasone (FLONASE) 50 MCG/ACT nasal spray   Each Nare   Place 1 spray into both nostrils 2 (two) times daily.    16 g   0   . glipiZIDE (GLUCOTROL) 5 MG tablet   Oral   Take 0.5 tablets (2.5 mg total) by mouth daily before breakfast.   90 tablet   0   . lisinopril (PRINIVIL,ZESTRIL) 20 MG tablet   Oral   Take 1 tablet (20 mg total) by mouth daily.   90 tablet   0   . MULTIPLE VITAMIN IV   Oral   Take by mouth.         . predniSONE (DELTASONE) 10 MG tablet   Oral   Take 1 tablet (10 mg total) by mouth as directed.   21 tablet   0     Take on a daily basis of 6, 5, 4, 3, 2, 1    ? Allergies Review of patient's allergies indicates no known allergies. ? Family History  Problem Relation Age of Onset  . Diabetes Mother   . Heart disease Mother   . Cancer Mother   . Heart disease Father   . Diabetes Sister   . Heart disease Sister    ? Social History Social History  Substance Use Topics  . Smoking status: Never Smoker   . Smokeless tobacco: Never Used  . Alcohol Use: No   ?  Review of Systems Constitutional: no fever. Eyes: no discharge ENT: no sore throat. Has  nasal congestion Cardiovascular: no chest pain. Respiratory: Positive cough. No sob Gastrointestinal: denies abdominal pain, vomiting, diarrhea, and constipation Genitourinary: no dysuria. Negative for hematuria Musculoskeletal: Negative for back pain. Skin: Negative for rash. Neurological: Negative for headaches  10-point ROS otherwise negative.  _______________ PHYSICAL EXAM: ? VITAL SIGNS:   ED Triage Vitals  Enc Vitals Group     BP 05/26/15 1529 145/63 mmHg     Pulse Rate 05/26/15 1529 86     Resp 05/26/15 1529 18     Temp 05/26/15 1529 98.1 F (36.7 C)     Temp Source 05/26/15 1529 Oral     SpO2 05/26/15 1529 95 %     Weight 05/26/15 1529 253 lb (114.76 kg)     Height 05/26/15 1529 '5\' 3"'$  (1.6 m)     Head Cir --      Peak Flow --      Pain Score --      Pain Loc --      Pain Edu? --      Excl. in [place]? --    ?  Constitutional: Alert and oriented. Well appearing and in no  distress. Eyes: Conjunctivae are normal.  ENT      Head: Normocephalic and atraumatic.      Ears: EACs and TMs are unremarkable bilaterally.      Nose: Moderate clear congestion/rhinnorhea. No tenderness to percussion over sinuses.      Mouth/Throat: Mucous membranes are moist.   Hematological/Lymphatic/Immunilogical: No cervical lymphadenopathy. Cardiovascular: Normal rate, regular rhythm. Normal S1 and S2. Respiratory: Normal respiratory effort without tachypnea nor retractions. Lungs with scattered expiratory wheezing. No rales or rhonchi. No absent or decreased breath sounds. Gastrointestinal: Soft and nontender. No distention. There is no CVA tenderness. Genitourinary:  Musculoskeletal: Nontender with normal range of motion in all extremities.  Neurologic:  Normal speech and language. No gross focal neurologic deficits are appreciated. Skin:  Skin is warm, dry and intact. No rash noted. Psychiatric: Mood and affect are normal. Speech and behavior are normal. Patient exhibits appropriate insight and judgment.    LABS (all labs ordered are listed, but only abnormal results are displayed)  Labs Reviewed - No data to display  ___________ RADIOLOGY    _____________ PROCEDURES ? Procedure(s) performed:    Medications - No data to display  ______________________________________________________ INITIAL IMPRESSION / ASSESSMENT AND PLAN / ED COURSE ? Pertinent labs & imaging results that were available during my care of the patient were reviewed by me and considered in my medical decision making (see chart for details).   Diagnosis diagnosis is consistent with bronchitis. Patient will be placed on albuterol, steroids, cough medication, and Flonase. Patient understands diagnosis and verbalizes compliance with treatment plan. She will follow up with primary care for symptoms persisting past this treatment course.     New Prescriptions   ALBUTEROL (PROVENTIL HFA;VENTOLIN HFA)  108 (90 BASE) MCG/ACT INHALER    Inhale 2 puffs into the lungs every 4 (four) hours as needed for wheezing or shortness of breath.   CHLORPHENIRAMINE-HYDROCODONE (TUSSIONEX PENNKINETIC ER) 10-8 MG/5ML SUER    Take 5 mLs by mouth 2 (two) times daily.   FLUTICASONE (FLONASE) 50 MCG/ACT NASAL SPRAY    Place 1 spray into both nostrils 2 (two) times daily.   PREDNISONE (DELTASONE) 10 MG TABLET    Take 1 tablet (10 mg total) by mouth as directed.   ____________________________________________  FINAL CLINICAL IMPRESSION(S) / ED DIAGNOSES?  Final diagnoses:  Acute bronchitis, unspecified organism            Darletta Moll, PA-C 05/26/15 1615  Daymon Larsen, MD 05/26/15 [phone]

## 2015-05-26 NOTE — Discharge Instructions (Signed)

## 2015-06-10 ENCOUNTER — Telehealth: Payer: Self-pay | Admitting: Family Medicine

## 2015-06-10 ENCOUNTER — Other Ambulatory Visit: Payer: Self-pay

## 2015-06-10 DIAGNOSIS — I10 Essential (primary) hypertension: Secondary | ICD-10-CM

## 2015-06-10 DIAGNOSIS — E1169 Type 2 diabetes mellitus with other specified complication: Secondary | ICD-10-CM

## 2015-06-10 DIAGNOSIS — E669 Obesity, unspecified: Principal | ICD-10-CM

## 2015-06-10 NOTE — Telephone Encounter (Signed)
Requesting refill on Glipizide and Lisinopril. Please send to [facility]

## 2015-06-10 NOTE — Telephone Encounter (Signed)
I am forwarding this encounter to the designated PCP and/or their nursing staff for further management of the tasks requested. Thank you.  

## 2015-06-11 MED ORDER — GLIPIZIDE 5 MG PO TABS: 2.5000 mg | ORAL_TABLET | Freq: Every day | ORAL | Status: DC

## 2015-06-11 MED ORDER — LISINOPRIL 20 MG PO TABS: 20.0000 mg | ORAL_TABLET | Freq: Every day | ORAL | Status: DC

## 2015-06-11 NOTE — Telephone Encounter (Signed)
Notified patient that she needs a medication refill appointment and she has a scheduled appointment for 06/23/2015

## 2015-06-23 ENCOUNTER — Ambulatory Visit (INDEPENDENT_AMBULATORY_CARE_PROVIDER_SITE_OTHER): Payer: 59 | Admitting: Family Medicine

## 2015-06-23 ENCOUNTER — Encounter: Payer: Self-pay | Admitting: Family Medicine

## 2015-06-23 VITALS — BP 143/68 | HR 103 | Temp 98.0°F | Resp 20 | Ht 63.0 in | Wt 257.2 lb

## 2015-06-23 DIAGNOSIS — E1169 Type 2 diabetes mellitus with other specified complication: Secondary | ICD-10-CM

## 2015-06-23 DIAGNOSIS — E669 Obesity, unspecified: Secondary | ICD-10-CM

## 2015-06-23 DIAGNOSIS — I10 Essential (primary) hypertension: Secondary | ICD-10-CM | POA: Diagnosis not present

## 2015-06-23 DIAGNOSIS — E119 Type 2 diabetes mellitus without complications: Secondary | ICD-10-CM

## 2015-06-23 LAB — POCT GLYCOSYLATED HEMOGLOBIN (HGB A1C): HEMOGLOBIN A1C: 6.8

## 2015-06-23 MED ORDER — GLIPIZIDE 5 MG PO TABS: 2.5000 mg | ORAL_TABLET | Freq: Every day | ORAL | Status: DC

## 2015-06-23 MED ORDER — LISINOPRIL 20 MG PO TABS: 20.0000 mg | ORAL_TABLET | Freq: Every day | ORAL | Status: DC

## 2015-06-23 NOTE — Progress Notes (Signed)
Name: Kara [name]   MRN: [id number]    DOB: 1950-11-30   Date:06/23/2015       Progress Note  Subjective  Chief Complaint  Chief Complaint  Patient presents with  . Medication Refill    lisinopril 20 mg / glipizide 5 mg    Hypertension This is a chronic problem. The problem is controlled. Pertinent negatives include no blurred vision, chest pain, headaches, palpitations or shortness of breath. Risk factors for coronary artery disease include dyslipidemia and obesity. Past treatments include ACE inhibitors. There is no history of kidney disease, CAD/MI or CVA.  Hyperlipidemia This is a chronic problem. The problem is controlled. Recent lipid tests were reviewed and are normal. Exacerbating diseases include diabetes and obesity. Pertinent negatives include no chest pain or shortness of breath. She is currently on no antihyperlipidemic treatment.  Diabetes She presents for her follow-up diabetic visit. Her disease course has been stable. Pertinent negatives for hypoglycemia include no headaches. (Shakes, sweats) Pertinent negatives for diabetes include no blurred vision and no chest pain. There are no hypoglycemic complications. Symptoms are stable. Pertinent negatives for diabetic complications include no CVA. Current diabetic treatment includes oral agent (monotherapy). Her weight is stable. She is following a diabetic diet. She monitors blood glucose at home 1-2 x per day. Her breakfast blood glucose range is generally 110-130 mg/dl. An ACE inhibitor/angiotensin II receptor blocker is being taken.     Past Medical History  Diagnosis Date  . Diabetes mellitus without complication ([place])   . Hypertension     Past Surgical History  Procedure Laterality Date  . Knee surgery      torn menicus  . Carpal tunnel release Bilateral     Family History  Problem Relation Age of Onset  . Diabetes Mother   . Heart disease Mother   . Cancer Mother   . Heart disease Father   .  Diabetes Sister   . Heart disease Sister     Social History   Social History  . Marital Status: Married    Spouse Name: N/A  . Number of Children: N/A  . Years of Education: N/A   Occupational History  . Not on file.   Social History Main Topics  . Smoking status: Never Smoker   . Smokeless tobacco: Never Used  . Alcohol Use: No  . Drug Use: No  . Sexual Activity: No   Other Topics Concern  . Not on file   Social History Narrative     Current outpatient prescriptions:  .  aspirin EC 81 MG tablet, Take 81 mg by mouth daily., Disp: , Rfl:  .  Cinnamon 500 MG capsule, Take 500 mg by mouth daily., Disp: , Rfl:  .  glipiZIDE (GLUCOTROL) 5 MG tablet, Take 0.5 tablets (2.5 mg total) by mouth daily before breakfast., Disp: 30 tablet, Rfl: 0 .  lisinopril (PRINIVIL,ZESTRIL) 20 MG tablet, Take 1 tablet (20 mg total) by mouth daily., Disp: 30 tablet, Rfl: 0 .  MULTIPLE VITAMIN IV, Take by mouth., Disp: , Rfl:   No Known Allergies   Review of Systems  Eyes: Negative for blurred vision.  Respiratory: Negative for shortness of breath.   Cardiovascular: Negative for chest pain and palpitations.  Neurological: Negative for headaches.    Objective  Filed Vitals:   06/23/15 1625  BP: 143/68  Pulse: 103  Temp: 98 F (36.7 C)  TempSrc: Oral  Resp: 20  Height: '5\' 3"'$  (1.6 m)  Weight: 257 lb 3.2  oz (116.665 kg)  SpO2: 93%    Physical Exam  Constitutional: She is oriented to person, place, and time and well-developed, well-nourished, and in no distress.  HENT:  Head: Normocephalic and atraumatic.  Cardiovascular: Normal rate, regular rhythm and normal heart sounds.   Pulmonary/Chest: Effort normal and breath sounds normal. She has no wheezes.  Abdominal: Soft. Bowel sounds are normal.  Musculoskeletal: She exhibits edema.       Right ankle: She exhibits swelling.       Left ankle: She exhibits swelling.  2+ pitting edema  Neurological: She is alert and oriented to  person, place, and time.  Psychiatric: Mood, memory, affect and judgment normal.  Nursing note and vitals reviewed.    Assessment & Plan  1. Essential hypertension Blood pressure elevated, likely exacerbated from fatigue and stress. No change in therapy at this time. Reassess in 3 months. - lisinopril (PRINIVIL,ZESTRIL) 20 MG tablet; Take 1 tablet (20 mg total) by mouth daily.  Dispense: 90 tablet; Refill: 0 - Comprehensive Metabolic Panel (CMET)  2. Diabetes mellitus type 2 in obese ([facility])  - glipiZIDE (GLUCOTROL) 5 MG tablet; Take 0.5 tablets (2.5 mg total) by mouth daily before breakfast.  Dispense: 90 tablet; Refill: 0 - POCT HgB A1C - Lipid Profile - Comprehensive Metabolic Panel (CMET) - POCT Glucose (CBG)   Zale Marcotte Asad A. [facility] 06/23/2015 4:52 PM

## 2015-06-28 ENCOUNTER — Other Ambulatory Visit
Admission: RE | Admit: 2015-06-28 | Discharge: 2015-06-28 | Disposition: A | Discharge status: Home / Self Care | Payer: 59 | Source: Ambulatory Visit | Attending: Family Medicine | Admitting: Family Medicine

## 2015-06-28 DIAGNOSIS — E119 Type 2 diabetes mellitus without complications: Secondary | ICD-10-CM | POA: Diagnosis not present

## 2015-06-28 DIAGNOSIS — E669 Obesity, unspecified: Secondary | ICD-10-CM | POA: Insufficient documentation

## 2015-06-28 DIAGNOSIS — I10 Essential (primary) hypertension: Secondary | ICD-10-CM | POA: Diagnosis present

## 2015-06-28 LAB — LIPID PANEL
CHOL/HDL RATIO: 4 ratio
CHOLESTEROL: 145 mg/dL (ref 0–200)
HDL: 36 mg/dL — AB (ref >40)
LDL Cholesterol: 98 mg/dL (ref 0–99)
Triglycerides: 55 mg/dL (ref <150)
VLDL: 11 mg/dL (ref 0–40)

## 2015-06-28 LAB — COMPREHENSIVE METABOLIC PANEL
ALBUMIN: 3.9 g/dL (ref 3.5–5.0)
ALT: 81 U/L — ABNORMAL HIGH (ref 14–54)
ANION GAP: 7 (ref 5–15)
AST: 45 U/L — ABNORMAL HIGH (ref 15–41)
Alkaline Phosphatase: 58 U/L (ref 38–126)
BILIRUBIN TOTAL: 0.6 mg/dL (ref 0.3–1.2)
BUN: 11 mg/dL (ref 6–20)
CHLORIDE: 111 mmol/L (ref 101–111)
CO2: 26 mmol/L (ref 22–32)
CREATININE: 0.73 mg/dL (ref 0.44–1.00)
Calcium: 8.8 mg/dL — ABNORMAL LOW (ref 8.9–10.3)
GFR calc non Af Amer: >60 mL/min (ref >60)
GLUCOSE: 161 mg/dL — AB (ref 65–99)
Potassium: 4.1 mmol/L (ref 3.5–5.1)
SODIUM: 144 mmol/L (ref 135–145)
Total Protein: 6.7 g/dL (ref 6.5–8.1)

## 2015-07-03 ENCOUNTER — Emergency Department
Admission: EM | Admit: 2015-07-03 | Discharge: 2015-07-04 | Disposition: A | Discharge status: Home / Self Care | Payer: 59 | Attending: Emergency Medicine | Admitting: Emergency Medicine

## 2015-07-03 DIAGNOSIS — R197 Diarrhea, unspecified: Secondary | ICD-10-CM | POA: Diagnosis present

## 2015-07-03 DIAGNOSIS — Z79899 Other long term (current) drug therapy: Secondary | ICD-10-CM | POA: Diagnosis not present

## 2015-07-03 DIAGNOSIS — Z7984 Long term (current) use of oral hypoglycemic drugs: Secondary | ICD-10-CM | POA: Insufficient documentation

## 2015-07-03 DIAGNOSIS — F419 Anxiety disorder, unspecified: Secondary | ICD-10-CM | POA: Insufficient documentation

## 2015-07-03 DIAGNOSIS — E119 Type 2 diabetes mellitus without complications: Secondary | ICD-10-CM | POA: Diagnosis not present

## 2015-07-03 DIAGNOSIS — I10 Essential (primary) hypertension: Secondary | ICD-10-CM | POA: Insufficient documentation

## 2015-07-03 DIAGNOSIS — Z7982 Long term (current) use of aspirin: Secondary | ICD-10-CM | POA: Diagnosis not present

## 2015-07-03 DIAGNOSIS — R109 Unspecified abdominal pain: Secondary | ICD-10-CM | POA: Diagnosis not present

## 2015-07-03 LAB — CBC
HCT: 45 % (ref 35.0–47.0)
HEMOGLOBIN: 15.2 g/dL (ref 12.0–16.0)
MCH: 31.4 pg (ref 26.0–34.0)
MCHC: 33.7 g/dL (ref 32.0–36.0)
MCV: 93.2 fL (ref 80.0–100.0)
Platelets: 189 10*3/uL (ref 150–440)
RBC: 4.83 MIL/uL (ref 3.80–5.20)
RDW: 13 % (ref 11.5–14.5)
WBC: 8.7 10*3/uL (ref 3.6–11.0)

## 2015-07-03 LAB — COMPREHENSIVE METABOLIC PANEL
ALT: 94 U/L — ABNORMAL HIGH (ref 14–54)
ANION GAP: 5 (ref 5–15)
AST: 57 U/L — ABNORMAL HIGH (ref 15–41)
Albumin: 4 g/dL (ref 3.5–5.0)
Alkaline Phosphatase: 62 U/L (ref 38–126)
BUN: 13 mg/dL (ref 6–20)
CHLORIDE: 110 mmol/L (ref 101–111)
CO2: 26 mmol/L (ref 22–32)
Calcium: 8.5 mg/dL — ABNORMAL LOW (ref 8.9–10.3)
Creatinine, Ser: 0.63 mg/dL (ref 0.44–1.00)
GFR calc non Af Amer: >60 mL/min (ref >60)
Glucose, Bld: 167 mg/dL — ABNORMAL HIGH (ref 65–99)
Potassium: 3.5 mmol/L (ref 3.5–5.1)
SODIUM: 141 mmol/L (ref 135–145)
Total Bilirubin: 0.5 mg/dL (ref 0.3–1.2)
Total Protein: 6.9 g/dL (ref 6.5–8.1)

## 2015-07-03 LAB — LIPASE, BLOOD: LIPASE: 23 U/L (ref 11–51)

## 2015-07-03 MED ORDER — ONDANSETRON HCL 4 MG/2ML IJ SOLN
4.0000 mg | Freq: Once | INTRAMUSCULAR | Status: AC
  Administered 2015-07-03: 4 mg via INTRAVENOUS
  Filled 2015-07-03: qty 2

## 2015-07-03 MED ORDER — MORPHINE SULFATE (PF) 4 MG/ML IV SOLN
4.0000 mg | Freq: Once | INTRAVENOUS | Status: AC
  Administered 2015-07-03: 4 mg via INTRAVENOUS
  Filled 2015-07-03: qty 1

## 2015-07-03 MED ORDER — SODIUM CHLORIDE 0.9 % IV BOLUS (SEPSIS)
1000.0000 mL | Freq: Once | INTRAVENOUS | Status: AC
  Administered 2015-07-03: 1000 mL via INTRAVENOUS

## 2015-07-03 NOTE — ED Notes (Signed)
Pt laying on side when BP taken.  Pt educated that BP lower when on side.  Pt refusing to turn onto back at this time.

## 2015-07-03 NOTE — ED Notes (Signed)
Pt states that she has had diarrhea with some bright red blood since 1000 today.  Pt states her stomach is cramping.  Denies nausea/vomiting at this time.

## 2015-07-03 NOTE — ED Notes (Signed)
Pt put on enteric precautions. Unable to use stool sample, pt educated to call when she has urge to use bathroom again.  Voiced understanding.

## 2015-07-03 NOTE — ED Provider Notes (Addendum)
[facility] Emergency Department Provider Note  ____________________________________________   I have reviewed the triage vital signs and the nursing notes.   HISTORY  Chief Complaint Diarrhea    HPI Kara [name] is a 65 y.o. female presents today with diarrhea. She's had had diarrhea since this morning. She states it was read once but since that time as been brown. She has had no fever. She has abdominal cramping. She states her husband was in the hospital for the exact same complaint a few days ago. She has had no recent antibiotics she states. She's never had C. difficile but her husband has she is very anxious and upset that perhaps she will develop C. difficile. She has had she states diarrhea every 30 minutes all day long. There is a very large community burden of GI gastroenteritis with a viral nature which is actually filled the department for the last 2 weeks. Patient states that she has no focal double pain just "cramping". She is very anxious and upset  Past Medical History  Diagnosis Date  . Diabetes mellitus without complication ([place])   . Hypertension     Patient Active Problem List   Diagnosis Date Noted  . Nocturnal leg cramps 02/20/2015  . Diabetes mellitus type 2 in obese ([place]) 11/12/2014  . BP (high blood pressure) 11/12/2014    Past Surgical History  Procedure Laterality Date  . Knee surgery      torn menicus  . Carpal tunnel release Bilateral     Current Outpatient Rx  Name  Route  Sig  Dispense  Refill  . aspirin EC 81 MG tablet   Oral   Take 81 mg by mouth daily.         . Cinnamon 500 MG capsule   Oral   Take 500 mg by mouth daily.         Marland Kitchen glipiZIDE (GLUCOTROL) 5 MG tablet   Oral   Take 0.5 tablets (2.5 mg total) by mouth daily before breakfast.   90 tablet   0   . lisinopril (PRINIVIL,ZESTRIL) 20 MG tablet   Oral   Take 1 tablet (20 mg total) by mouth daily.   90 tablet   0   . MULTIPLE VITAMIN  IV   Oral   Take by mouth.           Allergies Review of patient's allergies indicates no known allergies.  Family History  Problem Relation Age of Onset  . Diabetes Mother   . Heart disease Mother   . Cancer Mother   . Heart disease Father   . Diabetes Sister   . Heart disease Sister     Social History Social History  Substance Use Topics  . Smoking status: Never Smoker   . Smokeless tobacco: Never Used  . Alcohol Use: No    Review of Systems Constitutional: No fever, felt chills at home she states Eyes: No visual changes. ENT: No sore throat. No stiff neck no neck pain Cardiovascular: Denies chest pain. Respiratory: Denies shortness of breath. Gastrointestinal:   no vomiting.  Positive diarrhea.  No constipation. Genitourinary: Negative for dysuria. Musculoskeletal: Negative lower extremity swelling Skin: Negative for rash. Neurological: Negative for headaches, focal weakness or numbness. 10-point ROS otherwise negative.  ____________________________________________   PHYSICAL EXAM:  VITAL SIGNS: ED Triage Vitals  Enc Vitals Group     BP 07/03/15 2130 144/61 mmHg     Pulse Rate 07/03/15 2130 93  Resp 07/03/15 2130 27     Temp --      Temp src --      SpO2 07/03/15 2130 96 %     Weight --      Height --      Head Cir --      Peak Flow --      Pain Score 07/03/15 2106 5     Pain Loc --      Pain Edu? --      Excl. in [place]? --     Constitutional: Alert and oriented. Well appearing and in no acute distress however she is anxious and upset Eyes: Conjunctivae are normal. PERRL. EOMI. Head: Atraumatic. Nose: No congestion/rhinnorhea. Mouth/Throat: Mucous membranes are moist.  Oropharynx non-erythematous. Neck: No stridor.   Nontender with no meningismus Cardiovascular: Normal rate, regular rhythm. Grossly normal heart sounds.  Good peripheral circulation. Respiratory: Normal respiratory effort.  No retractions. Lungs CTAB. Abdominal: Soft and  minimal diffuse tenderness. No distention. No guarding no rebound Back:  There is no focal tenderness or step off there is no midline tenderness there are no lesions noted. there is no CVA tenderness Musculoskeletal: No lower extremity tenderness. No joint effusions, no DVT signs strong distal pulses no edema Neurologic:  Normal speech and language. No gross focal neurologic deficits are appreciated.  Skin:  Skin is warm, dry and intact. No rash noted. Psychiatric: Mood and affect are anxious upset. Speech and behavior are normal.  ____________________________________________   LABS (all labs ordered are listed, but only abnormal results are displayed)  Labs Reviewed  COMPREHENSIVE METABOLIC PANEL - Abnormal; Notable for the following:    Glucose, Bld 167 (*)    Calcium 8.5 (*)    AST 57 (*)    ALT 94 (*)    All other components within normal limits  GASTROINTESTINAL PANEL BY PCR, STOOL (REPLACES STOOL CULTURE)  C DIFFICILE QUICK SCREEN W PCR REFLEX  LIPASE, BLOOD  CBC  URINALYSIS COMPLETEWITH MICROSCOPIC ([name] ONLY)   ____________________________________________  EKG  I personally interpreted any EKGs ordered by me or triage Normal sinus rhythm rate 96 beats per an acute ST elevation or acute ST depression normal axis, possible old anterior infarct ____________________________________________  RADIOLOGY  I reviewed any imaging ordered by me or triage that were performed during my shift ____________________________________________   PROCEDURES  Procedure(s) performed: None  Critical Care performed: None  ____________________________________________   INITIAL IMPRESSION / ASSESSMENT AND PLAN / ED COURSE  Pertinent labs & imaging results that were available during my care of the patient were reviewed by me and considered in my medical decision making (see chart for details).  Patient with diarrhea, no recent antibiotics, she states, only risk factors for C.  difficile is her husband added years ago. She states there was perhaps a small amount of blood but she is very vague about this. We will check a guaiac test to see if she has a tear of invasive diarrhea. No recent travel. She is not indicated for antibiotics at this time. Blood work is reassuring. Abdominal exam is reassuring. Patient's blood pressure was reading low but she was lying on the cuff and declined to roll back over. We will recheck that. We're giving her IV fluid. I will do a rectal exam to make sure that there is no evidence of significant GI bleed. We'll recheck her abdominal exam did show that there is no evidence of a colitis requiring CT scan although at this time is  no evidence of that.  ----------------------------------------- 11:39 PM on 07/03/2015 ----------------------------------------- Patient feels much better no evidence of bleeding here. She is eager to go home. She states her diarrhea has really slowed down. We will ensure she take by mouth.  With a female nurse chaperone present, she is guaiac negative rectally. The pressure maintaining stable after IV fluids. Patient is requesting discharge at this time. She was not able to give [place] a stool sample for C. difficile but again low suspicion. Extensive return precautions and follow-up of the given for bleeding, increased pain, or fever or worsening diarrhea. Patient will follow-up with her doctor tomorrow. Serial abdominal exams which are no evidence of discomfort at this time and she is awake and alert and eager to leave a she able to ambulate with no lightheadedness or unsteady gait ____________________________________________   FINAL CLINICAL IMPRESSION(S) / ED DIAGNOSES  Final diagnoses:  None      This chart was dictated using voice recognition software.  Despite best efforts to proofread,  errors can occur which can change meaning.     Schuyler Amor, MD 07/03/15 [place], MD 07/03/15  [id number]  Schuyler Amor, MD 07/04/15 [id number]  Schuyler Amor, MD 07/04/15 0277  Schuyler Amor, MD 07/04/15 [id number]

## 2015-07-04 MED ORDER — SODIUM CHLORIDE 0.9 % IV BOLUS (SEPSIS)
500.0000 mL | Freq: Once | INTRAVENOUS | Status: AC
  Administered 2015-07-04: 500 mL via INTRAVENOUS

## 2015-07-04 NOTE — Discharge Instructions (Signed)
Drink plenty of fluid. If you have any bleeding from your bottom, feel lightheaded, any fever, chills, or you feel worse in any way return to the emergency department. You would prefer not to be admitted to the hospital which is certainly your choice but it does mean that we ask you to be exceptionally vigilant about your health and if you feel worse in any way please return. Do not fail to follow-up with your doctor tomorrow.

## 2015-07-04 NOTE — ED Notes (Signed)
Pt discharged to home.  Pt leaving by taxi cab.   Discharge instructions reviewed.  Verbalized understanding.  No questions or concerns at this time.  Teach back verified.  Pt in NAD.  No items left in ED.

## 2015-07-04 NOTE — ED Notes (Signed)
Pt up to restroom, no dizziness/lightheadedness.  Pt steady on feet.  Pt states that she feels better.

## 2015-07-04 NOTE — ED Notes (Signed)
Pt gets up to bathroom and back.  No dizziness/lightheadedness.  Gait steady.

## 2015-07-30 ENCOUNTER — Telehealth: Payer: Self-pay | Admitting: Family Medicine

## 2015-07-30 DIAGNOSIS — R059 Cough, unspecified: Secondary | ICD-10-CM | POA: Insufficient documentation

## 2015-07-30 DIAGNOSIS — R05 Cough: Secondary | ICD-10-CM | POA: Insufficient documentation

## 2015-07-30 MED ORDER — HYDROCOD POLST-CPM POLST ER 10-8 MG/5ML PO SUER: 5.0000 mL | Freq: Two times a day (BID) | ORAL | Status: DC | PRN

## 2015-07-30 NOTE — Telephone Encounter (Signed)
Patient has 2-3 day history of body aches, low-grade fever (Temp 99.5F), coughing, andchest pain with coughing. Requesting Z-Pak and Tussionex for relief. I will prescribe Tussionex for relief of cough and congestion but recommended conservative treatment as this appears to be a viral infection. If she is not better within the next 48 hours, she is to call for an antibiotic. Patient verbalized agreement.

## 2015-07-30 NOTE — Telephone Encounter (Signed)
Pt called stating she has a deep cough and would like to know if a Z-Pac and Tussinex can be called into [facility].

## 2015-07-30 NOTE — Telephone Encounter (Signed)
Routed to Dr. Manuella Ghazi for new prescription

## 2015-08-01 ENCOUNTER — Other Ambulatory Visit: Payer: Self-pay

## 2015-08-01 MED ORDER — AZITHROMYCIN 250 MG PO TABS: ORAL_TABLET | ORAL | Status: DC

## 2015-10-14 ENCOUNTER — Other Ambulatory Visit: Payer: Self-pay | Admitting: Family Medicine

## 2015-10-14 ENCOUNTER — Telehealth: Payer: Self-pay | Admitting: Family Medicine

## 2015-10-14 DIAGNOSIS — I10 Essential (primary) hypertension: Secondary | ICD-10-CM

## 2015-10-14 MED ORDER — LISINOPRIL 20 MG PO TABS: 20.0000 mg | ORAL_TABLET | Freq: Every day | ORAL | Status: DC

## 2015-10-14 NOTE — Telephone Encounter (Signed)
Patient only have one pill left of lisinopril. Is it possible to send a refill to [facility]. If patient needs to be seen will you send at least a 30day supply and she will make the appointment.

## 2015-10-14 NOTE — Telephone Encounter (Signed)
Please call pharmacy. For what ever reason the prescription is getting rejected.

## 2015-11-03 ENCOUNTER — Telehealth: Payer: Self-pay | Admitting: Family Medicine

## 2015-11-03 NOTE — Telephone Encounter (Signed)
Patient has been requesting to have prednisone called in to the [facility] on [address]. Due to back pain.  Please let patient if this can be done.  She called last week but has not had a response as of yet.

## 2015-11-03 NOTE — Telephone Encounter (Signed)
Patient should schedule an appointment to evaluate her symptoms of back pain and prescribed appropriate therapy.

## 2015-11-14 ENCOUNTER — Telehealth: Payer: Self-pay | Admitting: Family Medicine

## 2015-11-14 ENCOUNTER — Ambulatory Visit (INDEPENDENT_AMBULATORY_CARE_PROVIDER_SITE_OTHER): Payer: 59 | Admitting: Family Medicine

## 2015-11-14 ENCOUNTER — Encounter: Payer: Self-pay | Admitting: Family Medicine

## 2015-11-14 VITALS — BP 126/82 | HR 95 | Temp 98.3°F | Resp 18 | Ht 63.0 in | Wt 249.2 lb

## 2015-11-14 DIAGNOSIS — G8929 Other chronic pain: Secondary | ICD-10-CM

## 2015-11-14 DIAGNOSIS — I10 Essential (primary) hypertension: Secondary | ICD-10-CM

## 2015-11-14 DIAGNOSIS — M545 Low back pain: Secondary | ICD-10-CM | POA: Diagnosis not present

## 2015-11-14 MED ORDER — LISINOPRIL 20 MG PO TABS: 20.0000 mg | ORAL_TABLET | Freq: Every day | ORAL | Status: DC

## 2015-11-14 MED ORDER — PREDNISONE 10 MG (21) PO TBPK: 10.0000 mg | ORAL_TABLET | Freq: Every day | ORAL | Status: DC

## 2015-11-14 NOTE — Progress Notes (Signed)
Name: Kara [name]   MRN: [id number]    DOB: July 18, 1950   Date:11/14/2015       Progress Note  Subjective  Chief Complaint  Chief Complaint  Patient presents with  . Back Pain  . Hip Pain    right hip    Back Pain This is a recurrent problem. The current episode started more than 1 year ago. The problem has been gradually worsening since onset. The pain is present in the lumbar spine. The pain radiates to the right thigh. The pain is at a severity of 8/10. The pain is severe. Exacerbated by: Walking, bending. Pertinent negatives include no bladder incontinence, bowel incontinence, chest pain, headaches, leg pain or numbness. Treatments tried: Receives Steroid injctions in her back, which helps relieve her pain for 2 months on average.  Hypertension This is a chronic problem. The problem is unchanged. The problem is controlled. Pertinent negatives include no blurred vision, chest pain, headaches or palpitations. Past treatments include ACE inhibitors. There is no history of kidney disease, CAD/MI or CVA.    Past Medical History  Diagnosis Date  . Diabetes mellitus without complication ([place])   . Hypertension     Past Surgical History  Procedure Laterality Date  . Knee surgery      torn menicus  . Carpal tunnel release Bilateral     Family History  Problem Relation Age of Onset  . Diabetes Mother   . Heart disease Mother   . Cancer Mother   . Heart disease Father   . Diabetes Sister   . Heart disease Sister     Social History   Social History  . Marital Status: Married    Spouse Name: N/A  . Number of Children: N/A  . Years of Education: N/A   Occupational History  . Not on file.   Social History Main Topics  . Smoking status: Never Smoker   . Smokeless tobacco: Never Used  . Alcohol Use: No  . Drug Use: No  . Sexual Activity: No   Other Topics Concern  . Not on file   Social History Narrative     Current outpatient prescriptions:  .  aspirin  EC 81 MG tablet, Take 81 mg by mouth daily., Disp: , Rfl:  .  glipiZIDE (GLUCOTROL) 5 MG tablet, Take 0.5 tablets (2.5 mg total) by mouth daily before breakfast., Disp: 90 tablet, Rfl: 0 .  lisinopril (PRINIVIL,ZESTRIL) 20 MG tablet, Take 1 tablet (20 mg total) by mouth daily., Disp: 90 tablet, Rfl: 0 .  MULTIPLE VITAMIN IV, Take by mouth., Disp: , Rfl:   No Known Allergies   Review of Systems  Eyes: Negative for blurred vision.  Cardiovascular: Negative for chest pain and palpitations.  Gastrointestinal: Negative for bowel incontinence.  Genitourinary: Negative for bladder incontinence.  Musculoskeletal: Positive for back pain.  Neurological: Negative for numbness and headaches.    Objective  Filed Vitals:   11/14/15 1105  BP: 126/82  Pulse: 95  Temp: 98.3 F (36.8 C)  TempSrc: Oral  Resp: 18  Height: '5\' 3"'$  (1.6 m)  Weight: 249 lb 3.2 oz (113.036 kg)  SpO2: 96%    Physical Exam  Constitutional: She is well-developed, well-nourished, and in no distress.  Cardiovascular: Normal rate, regular rhythm and normal heart sounds.   Pulmonary/Chest: Effort normal and breath sounds normal. She has no wheezes. She has no rales.  Musculoskeletal:       Right ankle: She exhibits swelling.  Left ankle: She exhibits swelling (2+ pitting edema bilaterally.).       Lumbar back: She exhibits tenderness, pain and spasm.       Back:       Legs: SLR test positive on the right side.  Nursing note and vitals reviewed.      Assessment & Plan  1. Chronic right-sided low back pain without sciatica She'll receives injections in her lower back but her spine specialist is not in office. She has had good response with prednisone in the past. We'll start on 6 day tapering course of prednisone, patient is aware that prednisone may elevate her blood glucose. Follow-up with neurosurgery - predniSONE (STERAPRED UNI-PAK 21 TAB) 10 MG (21) TBPK tablet; Take 1 tablet (10 mg total) by mouth  daily. 60 50 40 '30 20 10 '$ then STOP  Dispense: 21 tablet; Refill: 0  2. Essential hypertension BP stable and controlled on present therapy - lisinopril (PRINIVIL,ZESTRIL) 20 MG tablet; Take 1 tablet (20 mg total) by mouth daily.  Dispense: 90 tablet; Refill: 0   Rakin Lemelle [facility] 11/14/2015 11:17 AM

## 2015-11-14 NOTE — Telephone Encounter (Signed)
She needs an office visit appointment in 2 months

## 2015-11-14 NOTE — Telephone Encounter (Signed)
Needing clarification: do you want her to schedule appointment in 2 months to see you or does she need to come in 2 months just do to labs?

## 2015-11-17 NOTE — Telephone Encounter (Signed)
lvm on cell informing patient that she need to schedule appointment to see him in 2 months.

## 2015-11-19 ENCOUNTER — Ambulatory Visit: Payer: 59 | Admitting: Family Medicine

## 2015-11-28 ENCOUNTER — Telehealth: Payer: Self-pay | Admitting: Family Medicine

## 2015-11-28 DIAGNOSIS — G8929 Other chronic pain: Secondary | ICD-10-CM

## 2015-11-28 DIAGNOSIS — M545 Low back pain: Principal | ICD-10-CM

## 2015-11-28 NOTE — Telephone Encounter (Signed)
Patient has diabetes and prednisone therapy can cause severe hyperglycemia. Please schedule for an appointment to obtain A1c and glucose levels

## 2015-11-28 NOTE — Telephone Encounter (Signed)
Pt needs a refill of prednisone for her pain until she goes to see Dr Sherwood Gambler at neurosurgeon  Office to be sent to [address]

## 2015-12-01 NOTE — Telephone Encounter (Signed)
Pt informed and scheduled a A1 C for 01/19/2016

## 2016-01-19 ENCOUNTER — Ambulatory Visit (INDEPENDENT_AMBULATORY_CARE_PROVIDER_SITE_OTHER): Payer: 59

## 2016-01-19 VITALS — BP 118/78 | HR 68 | Temp 98.6°F | Resp 16

## 2016-01-19 DIAGNOSIS — E119 Type 2 diabetes mellitus without complications: Secondary | ICD-10-CM | POA: Diagnosis not present

## 2016-01-19 LAB — POCT GLYCOSYLATED HEMOGLOBIN (HGB A1C): Hemoglobin A1C: 7

## 2016-02-04 ENCOUNTER — Ambulatory Visit (INDEPENDENT_AMBULATORY_CARE_PROVIDER_SITE_OTHER): Payer: 59 | Admitting: Family Medicine

## 2016-02-04 ENCOUNTER — Encounter: Payer: Self-pay | Admitting: Family Medicine

## 2016-02-04 VITALS — BP 121/72 | HR 82 | Temp 97.8°F | Resp 16 | Ht 63.0 in | Wt 242.8 lb

## 2016-02-04 DIAGNOSIS — E1169 Type 2 diabetes mellitus with other specified complication: Secondary | ICD-10-CM

## 2016-02-04 DIAGNOSIS — I10 Essential (primary) hypertension: Secondary | ICD-10-CM | POA: Diagnosis not present

## 2016-02-04 DIAGNOSIS — E119 Type 2 diabetes mellitus without complications: Secondary | ICD-10-CM | POA: Diagnosis not present

## 2016-02-04 DIAGNOSIS — E669 Obesity, unspecified: Secondary | ICD-10-CM

## 2016-02-04 LAB — LIPID PANEL
CHOL/HDL RATIO: 3.1 ratio (ref <=5.0)
CHOLESTEROL: 147 mg/dL (ref 125–200)
HDL: 47 mg/dL (ref >=46)
LDL Cholesterol: 90 mg/dL (ref <130)
Triglycerides: 51 mg/dL (ref <150)
VLDL: 10 mg/dL (ref <30)

## 2016-02-04 MED ORDER — GLIPIZIDE 5 MG PO TABS: 2.5000 mg | ORAL_TABLET | Freq: Every day | ORAL | 0 refills | Status: DC

## 2016-02-04 MED ORDER — LISINOPRIL 20 MG PO TABS: 20.0000 mg | ORAL_TABLET | Freq: Every day | ORAL | 0 refills | Status: DC

## 2016-02-04 NOTE — Progress Notes (Signed)
foll

## 2016-02-04 NOTE — Progress Notes (Signed)
Name: Kara [name]   MRN: [id number]    DOB: March 10, 1951   Date:02/04/2016       Progress Note  Subjective  Chief Complaint  Chief Complaint  Patient presents with  . Follow-up    2 wk A1C    Diabetes  She presents for her follow-up diabetic visit. She has type 2 diabetes mellitus. Her disease course has been worsening. Associated symptoms include polydipsia and polyuria. Pertinent negatives for diabetes include no fatigue. Pertinent negatives for diabetic complications include no CVA or heart disease. Current diabetic treatment includes oral agent (monotherapy). Her breakfast blood glucose range is generally 130-140 mg/dl. An ACE inhibitor/angiotensin II receptor blocker is being taken.     Past Medical History:  Diagnosis Date  . Diabetes mellitus without complication ([place])   . Hypertension     Past Surgical History:  Procedure Laterality Date  . CARPAL TUNNEL RELEASE Bilateral   . KNEE SURGERY     torn menicus    Family History  Problem Relation Age of Onset  . Diabetes Mother   . Heart disease Mother   . Cancer Mother   . Heart disease Father   . Diabetes Sister   . Heart disease Sister     Social History   Social History  . Marital status: Married    Spouse name: N/A  . Number of children: N/A  . Years of education: N/A   Occupational History  . Not on file.   Social History Main Topics  . Smoking status: Never Smoker  . Smokeless tobacco: Never Used  . Alcohol use No  . Drug use: No  . Sexual activity: No   Other Topics Concern  . Not on file   Social History Narrative  . No narrative on file     Current Outpatient Prescriptions:  .  aspirin EC 81 MG tablet, Take 81 mg by mouth daily., Disp: , Rfl:  .  glipiZIDE (GLUCOTROL) 5 MG tablet, Take 0.5 tablets (2.5 mg total) by mouth daily before breakfast., Disp: 90 tablet, Rfl: 0 .  lisinopril (PRINIVIL,ZESTRIL) 20 MG tablet, Take 1 tablet (20 mg total) by mouth daily., Disp: 90 tablet,  Rfl: 0 .  MULTIPLE VITAMIN IV, Take by mouth., Disp: , Rfl:  .  predniSONE (STERAPRED UNI-PAK 21 TAB) 10 MG (21) TBPK tablet, Take 1 tablet (10 mg total) by mouth daily. 60 50 40 '30 20 10 '$ then STOP (Patient not taking: Reported on 02/04/2016), Disp: 21 tablet, Rfl: 0  No Known Allergies   Review of Systems  Constitutional: Negative for fatigue.  Endo/Heme/Allergies: Positive for polydipsia.    Objective  Vitals:   02/04/16 0756  BP: 121/72  Pulse: 82  Resp: 16  Temp: 97.8 F (36.6 C)  TempSrc: Oral  SpO2: 96%  Weight: 242 lb 12.8 oz (110.1 kg)  Height: '5\' 3"'$  (1.6 m)    Physical Exam  Constitutional: She is oriented to person, place, and time and well-developed, well-nourished, and in no distress.  HENT:  Head: Normocephalic and atraumatic.  Cardiovascular: Normal rate, regular rhythm, S1 normal, S2 normal and normal heart sounds.   No murmur heard. Pulmonary/Chest: Breath sounds normal. She has no wheezes.  Abdominal: Soft. Bowel sounds are normal. There is no tenderness.  Musculoskeletal:       Right ankle: She exhibits no swelling.       Left ankle: She exhibits no swelling.  Neurological: She is alert and oriented to person, place, and time.  Psychiatric:  Mood, memory, affect and judgment normal.  Nursing note and vitals reviewed.     Recent Results (from the past 2160 hour(s))  POCT HgB A1C     Status: Abnormal   Collection Time: 01/19/16  8:33 AM  Result Value Ref Range   Hemoglobin A1C 7.0      Assessment & Plan  1. Diabetes mellitus type 2 in obese ([facility]) A1c at 7.1%, considered appropriate and at goal. Continue on glipizide, encouraged dietary compliance. - Lipid Profile - POCT UA - Microalbumin - glipiZIDE (GLUCOTROL) 5 MG tablet; Take 0.5 tablets (2.5 mg total) by mouth daily before breakfast.  Dispense: 90 tablet; Refill: 0  2. Essential hypertension  - lisinopril (PRINIVIL,ZESTRIL) 20 MG tablet; Take 1 tablet (20 mg total) by mouth daily.   Dispense: 90 tablet; Refill: 0   Ragnar Waas [facility] 02/04/2016 8:13 AM

## 2016-02-09 ENCOUNTER — Telehealth: Payer: Self-pay | Admitting: Family Medicine

## 2016-02-11 NOTE — Telephone Encounter (Signed)
Spoke to patient and she has been notified of lab results and she states she does not want to be on any statins due to cost of medication she said she can control it.

## 2016-02-23 ENCOUNTER — Telehealth: Payer: Self-pay | Admitting: Family Medicine

## 2016-02-23 DIAGNOSIS — M545 Low back pain, unspecified: Secondary | ICD-10-CM

## 2016-02-23 DIAGNOSIS — G8929 Other chronic pain: Secondary | ICD-10-CM | POA: Insufficient documentation

## 2016-02-23 DIAGNOSIS — M25552 Pain in left hip: Secondary | ICD-10-CM

## 2016-02-23 NOTE — Telephone Encounter (Signed)
Pt would like a referral to Dr Andree Elk at the pain clinic. [facility]. Please advise.

## 2016-02-23 NOTE — Telephone Encounter (Signed)
Acute left hip pain started after she received an injection in her lower back by the pain clinic. Going on for 3 weeks now and she is contacting Dr. Andree Elk who will see her if she has a referral from her PCP. Referral to pain clinic is authorized to be processed immediately.

## 2016-03-15 ENCOUNTER — Other Ambulatory Visit (HOSPITAL_COMMUNITY): Payer: Self-pay | Admitting: Neurosurgery

## 2016-03-15 DIAGNOSIS — M48062 Spinal stenosis, lumbar region with neurogenic claudication: Secondary | ICD-10-CM

## 2016-03-27 ENCOUNTER — Ambulatory Visit
Admission: RE | Admit: 2016-03-27 | Discharge: 2016-03-27 | Disposition: A | Discharge status: Home / Self Care | Payer: 59 | Source: Ambulatory Visit | Attending: Neurosurgery | Admitting: Neurosurgery

## 2016-03-27 DIAGNOSIS — M5126 Other intervertebral disc displacement, lumbar region: Secondary | ICD-10-CM | POA: Diagnosis not present

## 2016-03-27 DIAGNOSIS — M48062 Spinal stenosis, lumbar region with neurogenic claudication: Secondary | ICD-10-CM | POA: Diagnosis present

## 2016-05-05 ENCOUNTER — Ambulatory Visit: Payer: 59 | Admitting: Family Medicine

## 2016-05-21 ENCOUNTER — Telehealth: Payer: Self-pay | Admitting: Emergency Medicine

## 2016-05-21 DIAGNOSIS — J0100 Acute maxillary sinusitis, unspecified: Secondary | ICD-10-CM | POA: Insufficient documentation

## 2016-05-21 MED ORDER — AZITHROMYCIN 250 MG PO TABS: ORAL_TABLET | ORAL | 0 refills | Status: DC

## 2016-05-21 NOTE — Telephone Encounter (Signed)
Patient called in regards to her medication being sent to the pharmacy.  Patient stated that her azithromycin hasn't been called into the pharmacy and she was over there waiting.  Please call patient and advise.

## 2016-05-21 NOTE — Telephone Encounter (Signed)
Patient called and stated she has cough, congestion and facial pressure for 2 days. Would like something called in to [facility].

## 2016-05-21 NOTE — Telephone Encounter (Signed)
Prescription for azithromycin has been sent to patient's pharmacy

## 2016-05-24 DIAGNOSIS — I82403 Acute embolism and thrombosis of unspecified deep veins of lower extremity, bilateral: Secondary | ICD-10-CM

## 2016-05-24 DIAGNOSIS — I2699 Other pulmonary embolism without acute cor pulmonale: Secondary | ICD-10-CM

## 2016-05-24 DIAGNOSIS — K5792 Diverticulitis of intestine, part unspecified, without perforation or abscess without bleeding: Secondary | ICD-10-CM

## 2016-05-24 HISTORY — DX: Other pulmonary embolism without acute cor pulmonale: I26.99

## 2016-05-24 HISTORY — DX: Acute embolism and thrombosis of unspecified deep veins of lower extremity, bilateral: I82.403

## 2016-05-24 HISTORY — DX: Diverticulitis of intestine, part unspecified, without perforation or abscess without bleeding: K57.92

## 2016-05-25 ENCOUNTER — Telehealth: Payer: Self-pay | Admitting: Family Medicine

## 2016-05-25 DIAGNOSIS — J0100 Acute maxillary sinusitis, unspecified: Secondary | ICD-10-CM

## 2016-05-25 NOTE — Telephone Encounter (Signed)
Pt would like to know if some Tussinex can be called into [facility]

## 2016-05-28 MED ORDER — HYDROCOD POLST-CPM POLST ER 10-8 MG/5ML PO SUER: 5.0000 mL | Freq: Two times a day (BID) | ORAL | 0 refills | Status: DC | PRN

## 2016-05-28 NOTE — Telephone Encounter (Signed)
Prescription for Tussionex is printed and ready for pickup

## 2016-05-28 NOTE — Telephone Encounter (Signed)
Tussionex can not be called in. Please print

## 2016-05-31 NOTE — Telephone Encounter (Signed)
LMOM to inform pt °

## 2016-06-01 NOTE — Telephone Encounter (Signed)
Patient picked up script

## 2016-06-10 ENCOUNTER — Emergency Department
Admission: EM | Admit: 2016-06-10 | Discharge: 2016-06-10 | Disposition: A | Discharge status: Home / Self Care | Payer: Managed Care, Other (non HMO) | Attending: Emergency Medicine | Admitting: Emergency Medicine

## 2016-06-10 ENCOUNTER — Emergency Department: Payer: Managed Care, Other (non HMO)

## 2016-06-10 ENCOUNTER — Encounter: Payer: Self-pay | Admitting: Emergency Medicine

## 2016-06-10 DIAGNOSIS — E119 Type 2 diabetes mellitus without complications: Secondary | ICD-10-CM | POA: Diagnosis not present

## 2016-06-10 DIAGNOSIS — I10 Essential (primary) hypertension: Secondary | ICD-10-CM | POA: Insufficient documentation

## 2016-06-10 DIAGNOSIS — Z7984 Long term (current) use of oral hypoglycemic drugs: Secondary | ICD-10-CM | POA: Diagnosis not present

## 2016-06-10 DIAGNOSIS — Z7982 Long term (current) use of aspirin: Secondary | ICD-10-CM | POA: Insufficient documentation

## 2016-06-10 DIAGNOSIS — Z79899 Other long term (current) drug therapy: Secondary | ICD-10-CM | POA: Diagnosis not present

## 2016-06-10 DIAGNOSIS — J40 Bronchitis, not specified as acute or chronic: Secondary | ICD-10-CM

## 2016-06-10 DIAGNOSIS — R05 Cough: Secondary | ICD-10-CM | POA: Diagnosis present

## 2016-06-10 LAB — INFLUENZA PANEL BY PCR (TYPE A & B)
INFLAPCR: NEGATIVE
INFLBPCR: NEGATIVE

## 2016-06-10 MED ORDER — PREDNISONE 10 MG PO TABS: 40.0000 mg | ORAL_TABLET | Freq: Every day | ORAL | 0 refills | Status: AC

## 2016-06-10 MED ORDER — IPRATROPIUM-ALBUTEROL 0.5-2.5 (3) MG/3ML IN SOLN
3.0000 mL | Freq: Once | RESPIRATORY_TRACT | Status: AC
Start: 2016-06-10 — End: 2016-06-10
  Administered 2016-06-10: 3 mL via RESPIRATORY_TRACT
  Filled 2016-06-10: qty 3

## 2016-06-10 MED ORDER — ALBUTEROL SULFATE HFA 108 (90 BASE) MCG/ACT IN AERS: 2.0000 | INHALATION_SPRAY | Freq: Four times a day (QID) | RESPIRATORY_TRACT | 0 refills | Status: DC | PRN

## 2016-06-10 NOTE — ED Notes (Signed)
Pt states dry hacking cough for several days, denies any productive sputum, awake and alert in no acute distress

## 2016-06-10 NOTE — ED Provider Notes (Signed)
[facility] Emergency Department Provider Note  ____________________________________________  Time seen: Approximately 3:34 PM  I have reviewed the triage vital signs and the nursing notes.   HISTORY  Chief Complaint No chief complaint on file.    HPI Kara [name] is a 66 y.o. female that presents to the emergency department with a non productive cough for 3 days.  Patient was sick a couple weeks ago with sinus infection and cough and was given Z-Pak and Tussinex from doctor. Sinus symptoms improved. Patient has been taking left over Tussinex for symptoms, which is helping a little bit. Patient denies fever, headache, sinus congestion, ear pain, SOB, nausea, vomiting, abdominal pain. No sick contacts.Patient is diabetic.  Past Medical History:  Diagnosis Date  . Diabetes mellitus without complication ([place])   . Hypertension     Patient Active Problem List   Diagnosis Date Noted  . Acute non-recurrent maxillary sinusitis 05/21/2016  . Acute hip pain, left 02/23/2016  . Chronic low back pain without sciatica 11/14/2015  . Cough 07/30/2015  . Nocturnal leg cramps 02/20/2015  . Diabetes mellitus type 2 in obese ([place]) 11/12/2014  . BP (high blood pressure) 11/12/2014    Past Surgical History:  Procedure Laterality Date  . CARPAL TUNNEL RELEASE Bilateral   . KNEE SURGERY     torn menicus    Prior to Admission medications   Medication Sig Start Date End Date Taking? Authorizing Provider  albuterol (PROVENTIL HFA;VENTOLIN HFA) 108 (90 Base) MCG/ACT inhaler Inhale 2 puffs into the lungs every 6 (six) hours as needed for wheezing or shortness of breath. 06/10/16   Laban Emperor, PA-C  aspirin EC 81 MG tablet Take 81 mg by mouth daily.    Historical Provider, MD  azithromycin (ZITHROMAX) 250 MG tablet 2 tabs po day 1 then 1 tab po q day x 4 days 05/21/16   Roselee Nova, MD  chlorpheniramine-HYDROcodone [facility] PENNKINETIC ER) 10-8 MG/5ML  SUER Take 5 mLs by mouth every 12 (twelve) hours as needed for cough. 05/28/16   Roselee Nova, MD  glipiZIDE (GLUCOTROL) 5 MG tablet Take 0.5 tablets (2.5 mg total) by mouth daily before breakfast. 02/04/16   Roselee Nova, MD  lisinopril (PRINIVIL,ZESTRIL) 20 MG tablet Take 1 tablet (20 mg total) by mouth daily. 02/04/16   Roselee Nova, MD  MULTIPLE VITAMIN IV Take by mouth.    Historical Provider, MD  predniSONE (DELTASONE) 10 MG tablet Take 4 tablets (40 mg total) by mouth daily. 06/10/16 06/15/16  Laban Emperor, PA-C    Allergies Patient has no known allergies.  Family History  Problem Relation Age of Onset  . Diabetes Mother   . Heart disease Mother   . Cancer Mother   . Heart disease Father   . Diabetes Sister   . Heart disease Sister     Social History Social History  Substance Use Topics  . Smoking status: Never Smoker  . Smokeless tobacco: Never Used  . Alcohol use No     Review of Systems  Constitutional: No fever/chills Eyes: No visual changes. No discharge. ENT: Negative for congestion and rhinorrhea. Cardiovascular: No chest pain. Respiratory: Positive for cough. No SOB. Gastrointestinal: No abdominal pain.  No nausea, no vomiting.  No diarrhea.  No constipation. Musculoskeletal: Negative for musculoskeletal pain. Skin: Negative for rash, abrasions, lacerations, ecchymosis. Neurological: Negative for headaches.   ____________________________________________   PHYSICAL EXAM:  VITAL SIGNS: ED Triage Vitals  Enc Vitals  Group     BP 06/10/16 1446 (!) 143/50     Pulse Rate 06/10/16 1446 97     Resp 06/10/16 1446 20     Temp 06/10/16 1446 98.1 F (36.7 C)     Temp Source 06/10/16 1446 Oral     SpO2 06/10/16 1446 95 %     Weight 06/10/16 1447 232 lb (105.2 kg)     Height 06/10/16 1447 '5\' 3"'$  (1.6 m)     Head Circumference --      Peak Flow --      Pain Score 06/10/16 1447 8     Pain Loc --      Pain Edu? --      Excl. in [place]? --       Constitutional: Alert and oriented. Well appearing and in no acute distress. Eyes: Conjunctivae are normal. PERRL. EOMI. No discharge. Head: Atraumatic. ENT: No frontal and maxillary sinus tenderness.      Ears: Tympanic membranes pearly gray with good landmarks. No discharge.      Nose: No congestion/rhinnorhea.      Mouth/Throat: Mucous membranes are moist. Oropharynx non-erythematous. Tonsils not enlarged. No exudates. Uvula midline. Neck: No stridor.   Hematological/Lymphatic/Immunilogical: No cervical lymphadenopathy. Cardiovascular: Normal rate, regular rhythm. Normal S1 and S2.  Good peripheral circulation. Respiratory: Normal respiratory effort without tachypnea or retractions. Lungs CTAB. Good air entry to the bases with no decreased or absent breath sounds. Gastrointestinal: Bowel sounds 4 quadrants. Soft and nontender to palpation. No guarding or rigidity. No palpable masses. No distention. Musculoskeletal: Full range of motion to all extremities. No gross deformities appreciated. Neurologic:  Normal speech and language. No gross focal neurologic deficits are appreciated.  Skin:  Skin is warm, dry and intact. No rash noted. Psychiatric: Mood and affect are normal. Speech and behavior are normal. Patient exhibits appropriate insight and judgement.   ____________________________________________   LABS (all labs ordered are listed, but only abnormal results are displayed)  Labs Reviewed  INFLUENZA PANEL BY PCR (TYPE A & B)   ____________________________________________  EKG   ____________________________________________  RADIOLOGY Robinette Haines, personally viewed and evaluated these images (plain radiographs) as part of my medical decision making, as well as reviewing the written report by the radiologist.  Dg Chest 2 View  Result Date: 06/10/2016 CLINICAL DATA:  Dry hacking nonproductive cough for 3 days EXAM: CHEST  2 VIEW COMPARISON:  01/21/2013 FINDINGS:  Upper normal heart size. Mediastinal contours and pulmonary vascularity normal. Mild chronic peribronchial thickening. Lungs clear. No pleural effusion or pneumothorax. Osseous structures unremarkable. IMPRESSION: Mild chronic bronchitic changes without infiltrate. Electronically Signed   By: Lavonia Dana M.D.   On: 06/10/2016 16:15    ____________________________________________    PROCEDURES  Procedure(s) performed:    Procedures    Medications  ipratropium-albuterol (DUONEB) 0.5-2.5 (3) MG/3ML nebulizer solution 3 mL (3 mLs Nebulization Given 06/10/16 1539)     ____________________________________________   INITIAL IMPRESSION / ASSESSMENT AND PLAN / ED COURSE  Pertinent labs & imaging results that were available during my care of the patient were reviewed by me and considered in my medical decision making (see chart for details).  Review of the [place] CSRS was performed in accordance of the [place] prior to dispensing any controlled drugs.     Patient's diagnosis is consistent with Bronchitis. Exam and vital signs are reassuring. Chest x-ray negative for any acute cardiopulmonary processes. Influenza negative. Patient will be discharged home with prescriptions for prednisone and albuterol inhaler.  Patient is diabetic but states that she has taken steroids before and watch her sugars closely.   She will continue taking Tussionex. Patient is to follow up with PCP as needed or otherwise directed. Patient is given ED precautions to return to the ED for any worsening or new symptoms.     ____________________________________________  FINAL CLINICAL IMPRESSION(S) / ED DIAGNOSES  Final diagnoses:  Bronchitis      NEW MEDICATIONS STARTED DURING THIS VISIT:  Discharge Medication List as of 06/10/2016  5:59 PM    START taking these medications   Details  albuterol (PROVENTIL HFA;VENTOLIN HFA) 108 (90 Base) MCG/ACT inhaler Inhale 2 puffs into the lungs every 6 (six) hours as needed  for wheezing or shortness of breath., Starting Thu 06/10/2016, Print    predniSONE (DELTASONE) 10 MG tablet Take 4 tablets (40 mg total) by mouth daily., Starting Thu 06/10/2016, Until Tue 06/15/2016, Print            This chart was dictated using voice recognition software/Dragon. Despite best efforts to proofread, errors can occur which can change the meaning. Any change was purely unintentional.    Laban Emperor, PA-C 06/10/16 [place], MD 06/10/16 [phone]

## 2016-06-10 NOTE — ED Triage Notes (Signed)
C/o cough X 3 days. Denies sputum. Denies fevers. Doctor gave tussinex but still has cough per pt. Thinks may have bronchitis and would like to get checked.  No respiratory distress noted. Ambulated to triage.

## 2016-07-14 DIAGNOSIS — M5136 Other intervertebral disc degeneration, lumbar region: Secondary | ICD-10-CM | POA: Insufficient documentation

## 2016-08-24 ENCOUNTER — Emergency Department: Payer: PPO

## 2016-08-24 ENCOUNTER — Emergency Department
Admission: EM | Admit: 2016-08-24 | Discharge: 2016-08-24 | Disposition: A | Discharge status: Home / Self Care | Payer: PPO | Attending: Student in an Organized Health Care Education/Training Program | Admitting: Student in an Organized Health Care Education/Training Program

## 2016-08-24 DIAGNOSIS — E119 Type 2 diabetes mellitus without complications: Secondary | ICD-10-CM | POA: Diagnosis not present

## 2016-08-24 DIAGNOSIS — Z79899 Other long term (current) drug therapy: Secondary | ICD-10-CM | POA: Diagnosis not present

## 2016-08-24 DIAGNOSIS — R072 Precordial pain: Secondary | ICD-10-CM | POA: Diagnosis not present

## 2016-08-24 DIAGNOSIS — Z7982 Long term (current) use of aspirin: Secondary | ICD-10-CM | POA: Diagnosis not present

## 2016-08-24 DIAGNOSIS — R0602 Shortness of breath: Secondary | ICD-10-CM | POA: Insufficient documentation

## 2016-08-24 DIAGNOSIS — R0600 Dyspnea, unspecified: Secondary | ICD-10-CM | POA: Diagnosis not present

## 2016-08-24 DIAGNOSIS — Z7984 Long term (current) use of oral hypoglycemic drugs: Secondary | ICD-10-CM | POA: Diagnosis not present

## 2016-08-24 DIAGNOSIS — I10 Essential (primary) hypertension: Secondary | ICD-10-CM | POA: Diagnosis not present

## 2016-08-24 DIAGNOSIS — R079 Chest pain, unspecified: Secondary | ICD-10-CM | POA: Diagnosis not present

## 2016-08-24 LAB — BASIC METABOLIC PANEL
Anion gap: 5 (ref 5–15)
BUN: 14 mg/dL (ref 6–20)
CALCIUM: 8.8 mg/dL — AB (ref 8.9–10.3)
CO2: 28 mmol/L (ref 22–32)
CREATININE: 0.86 mg/dL (ref 0.44–1.00)
Chloride: 107 mmol/L (ref 101–111)
GFR calc Af Amer: >60 mL/min (ref >60)
GFR calc non Af Amer: >60 mL/min (ref >60)
Glucose, Bld: 129 mg/dL — ABNORMAL HIGH (ref 65–99)
Potassium: 3.7 mmol/L (ref 3.5–5.1)
Sodium: 140 mmol/L (ref 135–145)

## 2016-08-24 LAB — BRAIN NATRIURETIC PEPTIDE: B Natriuretic Peptide: 47 pg/mL (ref 0.0–100.0)

## 2016-08-24 LAB — CBC
HCT: 42.9 % (ref 35.0–47.0)
HEMOGLOBIN: 14.6 g/dL (ref 12.0–16.0)
MCH: 31.1 pg (ref 26.0–34.0)
MCHC: 34 g/dL (ref 32.0–36.0)
MCV: 91.6 fL (ref 80.0–100.0)
Platelets: 227 10*3/uL (ref 150–440)
RBC: 4.69 MIL/uL (ref 3.80–5.20)
RDW: 13.5 % (ref 11.5–14.5)
WBC: 5.7 10*3/uL (ref 3.6–11.0)

## 2016-08-24 LAB — TROPONIN I: Troponin I: <0.03 ng/mL (ref <0.03)

## 2016-08-24 LAB — FIBRIN DERIVATIVES D-DIMER (ARMC ONLY): Fibrin derivatives D-dimer (ARMC): 663.65 — ABNORMAL HIGH (ref 0.00–499.00)

## 2016-08-24 MED ORDER — SODIUM CHLORIDE 0.9 % IV BOLUS (SEPSIS)
1000.0000 mL | Freq: Once | INTRAVENOUS | Status: AC
  Administered 2016-08-24: 1000 mL via INTRAVENOUS

## 2016-08-24 MED ORDER — IOPAMIDOL (ISOVUE-370) INJECTION 76%
75.0000 mL | Freq: Once | INTRAVENOUS | Status: AC | PRN
  Administered 2016-08-24: 75 mL via INTRAVENOUS

## 2016-08-24 NOTE — ED Provider Notes (Signed)
[facility] Emergency Department Provider Note    First MD Initiated Contact with Patient 08/24/16 1126     (approximate)  I have reviewed the triage vital signs and the nursing notes.   HISTORY  Chief Complaint Chest Pain    HPI Kara [name] is a 66 y.o. female history diabetes and hypertension presents with midsternal chest pain that started around 8:00 this morning. Patient works as a care provider for an elderly gentleman. States that she felt like she was having indigestion. States the pain lasted roughly 30-45 minutes. There is no change in the symptoms with exertion. There is no diaphoresis. No nausea or vomiting. Did have pain when taking a deep breath. States that she's never had this kind of pain before. No history of heart disease. States her diabetes is well-controlled. Has never seen a cardiologist. Denies any pain radiating to her jaw. No epigastric discomfort. She is currently chest pain-free with exception of palpation of the mid chest wall does reproduce the pain.   Past Medical History:  Diagnosis Date  . Diabetes mellitus without complication ([place])   . Hypertension    Family History  Problem Relation Age of Onset  . Diabetes Mother   . Heart disease Mother   . Cancer Mother   . Heart disease Father   . Diabetes Sister   . Heart disease Sister    Past Surgical History:  Procedure Laterality Date  . CARPAL TUNNEL RELEASE Bilateral   . KNEE SURGERY     torn menicus   Patient Active Problem List   Diagnosis Date Noted  . Acute non-recurrent maxillary sinusitis 05/21/2016  . Acute hip pain, left 02/23/2016  . Chronic low back pain without sciatica 11/14/2015  . Cough 07/30/2015  . Nocturnal leg cramps 02/20/2015  . Diabetes mellitus type 2 in obese ([place]) 11/12/2014  . BP (high blood pressure) 11/12/2014      Prior to Admission medications   Medication Sig Start Date End Date Taking? Authorizing Provider    aspirin EC 81 MG tablet Take 81 mg by mouth daily.   Yes Historical Provider, MD  cyclobenzaprine (FLEXERIL) 10 MG tablet Take 10 mg by mouth daily as needed. 07/05/16  Yes Historical Provider, MD  diclofenac (VOLTAREN) 75 MG EC tablet Take 75 mg by mouth daily. 07/05/16  Yes Historical Provider, MD  glipiZIDE (GLUCOTROL) 5 MG tablet Take 0.5 tablets (2.5 mg total) by mouth daily before breakfast. 02/04/16  Yes Roselee Nova, MD  losartan (COZAAR) 50 MG tablet Take 50 mg by mouth daily. 07/21/16  Yes Historical Provider, MD  Multiple Vitamin (MULTI-VITAMINS) TABS Take 1 tablet by mouth daily.   Yes Historical Provider, MD  albuterol (PROVENTIL HFA;VENTOLIN HFA) 108 (90 Base) MCG/ACT inhaler Inhale 2 puffs into the lungs every 6 (six) hours as needed for wheezing or shortness of breath. Patient not taking: Reported on 08/24/2016 06/10/16   Laban Emperor, PA-C  azithromycin (ZITHROMAX) 250 MG tablet 2 tabs po day 1 then 1 tab po q day x 4 days Patient not taking: Reported on 08/24/2016 05/21/16   Roselee Nova, MD  chlorpheniramine-HYDROcodone [facility] PENNKINETIC ER) 10-8 MG/5ML SUER Take 5 mLs by mouth every 12 (twelve) hours as needed for cough. Patient not taking: Reported on 08/24/2016 05/28/16   Roselee Nova, MD  lisinopril (PRINIVIL,ZESTRIL) 20 MG tablet Take 1 tablet (20 mg total) by mouth daily. Patient not taking: Reported on 08/24/2016 02/04/16   Dossie Der  [place] Campbell, MD    Allergies Patient has no known allergies.    Social History Social History  Substance Use Topics  . Smoking status: Never Smoker  . Smokeless tobacco: Never Used  . Alcohol use No    Review of Systems Patient denies headaches, rhinorrhea, blurry vision, numbness, shortness of breath, chest pain, edema, cough, abdominal pain, nausea, vomiting, diarrhea, dysuria, fevers, rashes or hallucinations unless otherwise stated above in HPI. ____________________________________________   PHYSICAL EXAM:  VITAL  SIGNS: Vitals:   08/24/16 1300 08/24/16 1447  BP: (!) 152/68 (!) 151/88  Pulse: 69 83  Resp: (!) 24 17  Temp:      Constitutional: Alert and oriented. Well appearing and in no acute distress. Eyes: Conjunctivae are normal. PERRL. EOMI. Head: Atraumatic. Nose: No congestion/rhinnorhea. Mouth/Throat: Mucous membranes are moist.  Oropharynx non-erythematous. Neck: No stridor. Painless ROM. No cervical spine tenderness to palpation Hematological/Lymphatic/Immunilogical: No cervical lymphadenopathy. Cardiovascular: Normal rate, regular rhythm. Grossly normal heart sounds.  Good peripheral circulation. Respiratory: Normal respiratory effort.  No retractions. Lungs CTAB. Gastrointestinal: Soft and nontender. No distention. No abdominal bruits. No CVA tenderness. Genitourinary:  Musculoskeletal: pain reproducible with palpation of sternum, no lypmhnodes, erythema or skin changesNo lower extremity tenderness nor edema.  No joint effusions. Neurologic:  Normal speech and language. No gross focal neurologic deficits are appreciated. No gait instability. Skin:  Skin is warm, dry and intact. No rash noted. Psychiatric: Mood and affect are normal. Speech and behavior are normal.  ____________________________________________   LABS (all labs ordered are listed, but only abnormal results are displayed)  Results for orders placed or performed during the hospital encounter of 08/24/16 (from the past 24 hour(s))  Basic metabolic panel     Status: Abnormal   Collection Time: 08/24/16  9:57 AM  Result Value Ref Range   Sodium 140 135 - 145 mmol/L   Potassium 3.7 3.5 - 5.1 mmol/L   Chloride 107 101 - 111 mmol/L   CO2 28 22 - 32 mmol/L   Glucose, Bld 129 (H) 65 - 99 mg/dL   BUN 14 6 - 20 mg/dL   Creatinine, Ser 0.86 0.44 - 1.00 mg/dL   Calcium 8.8 (L) 8.9 - 10.3 mg/dL   GFR calc non Af Amer >60 >60 mL/min   GFR calc Af Amer >60 >60 mL/min   Anion gap 5 5 - 15  CBC     Status: None   Collection  Time: 08/24/16  9:57 AM  Result Value Ref Range   WBC 5.7 3.6 - 11.0 K/uL   RBC 4.69 3.80 - 5.20 MIL/uL   Hemoglobin 14.6 12.0 - 16.0 g/dL   HCT 42.9 35.0 - 47.0 %   MCV 91.6 80.0 - 100.0 fL   MCH 31.1 26.0 - 34.0 pg   MCHC 34.0 32.0 - 36.0 g/dL   RDW 13.5 11.5 - 14.5 %   Platelets 227 150 - 440 K/uL  Troponin I     Status: None   Collection Time: 08/24/16  9:57 AM  Result Value Ref Range   Troponin I <0.03 <0.03 ng/mL  Brain natriuretic peptide     Status: None   Collection Time: 08/24/16  9:57 AM  Result Value Ref Range   B Natriuretic Peptide 47.0 0.0 - 100.0 pg/mL  Troponin I     Status: None   Collection Time: 08/24/16  1:22 PM  Result Value Ref Range   Troponin I <0.03 <0.03 ng/mL   ____________________________________________  EKG My review  and personal interpretation at Time: 9:55   Indication: sob  Rate: 75  Rhythm: sinus Axis: normal Other: no STEMI or st depressions, normal intervals ____________________________________________  RADIOLOGY  I personally reviewed all radiographic images ordered to evaluate for the above acute complaints and reviewed radiology reports and findings.  These findings were personally discussed with the patient.  Please see medical record for radiology report.  ____________________________________________   PROCEDURES  Procedure(s) performed:  Procedures    Critical Care performed: no ____________________________________________   INITIAL IMPRESSION / ASSESSMENT AND PLAN / ED COURSE  Pertinent labs & imaging results that were available during my care of the patient were reviewed by me and considered in my medical decision making (see chart for details).  DDX: ACS, pericarditis, esophagitis, boerhaaves, pe, dissection, pna, bronchitis, costochondritis   Taralee Marcus Yessenia Maillet is a 66 y.o. who presents to the ED with Chest pain as described above. Arrives in no acute distress. Initial EKG shows no evidence of acute ischemia  and initial troponin is negative. Patient has a heart score of 3 based on her age and risk factors. Patient is low risk by well's criteria. We'll send d-dimer to further risk stratify for pulmonary embolus, she does describe a brief episode of pleuritic chest pain. Chest x-ray otherwise shows no evidence of pneumonia. Pain is reproducible with palpation on the anterior chest wall. No evidence of shingles or cellulitic changes.  The patient will be placed on continuous pulse oximetry and telemetry for monitoring.  Laboratory evaluation will be sent to evaluate for the above complaints.     Clinical Course as of Aug 24 1545  Tue Aug 24, 2016  1443 D-dimer elevated.  Will order CTA chest to evaluate for PE.  Repeat trop negative.  No chest pain since being in ED.    [PR]  4854 CT without evidence of PE.  No LE edema to suggest evidence of DVT.  Based on presentation, do not feel patient is demonstrating any evidence of pneumonitis or infection.  Patietn stable for follow up with PCP and cardiology.  Patient remains stable and in NAD.  She rates that she's not had any chest pain since being here. At this point do not feel the patient requires admission for emergent testing at this point. I do feel that she is stable for follow-up as described above.  Patient was able to tolerate PO and was able to ambulate with a steady gait.  Have discussed with the patient and available family all diagnostics and treatments performed thus far and all questions were answered to the best of my ability. The patient demonstrates understanding and agreement with plan.   [PR]    Clinical Course User Index [PR] Merlyn Lot, MD     ____________________________________________   FINAL CLINICAL IMPRESSION(S) / ED DIAGNOSES  Final diagnoses:  Chest pain, unspecified type      NEW MEDICATIONS STARTED DURING THIS VISIT:  Current Discharge Medication List       Note:  This document was prepared using Dragon  voice recognition software and may include unintentional dictation errors.    Merlyn Lot, MD 08/24/16 [phone]

## 2016-08-24 NOTE — Discharge Instructions (Signed)
Return to ER for any increase in pain, if the pain changes or becomes worse with physical activity, you have shortness of breath, nausea or vomiting associated with the chest pain. ° °

## 2016-08-24 NOTE — ED Triage Notes (Signed)
Pt c/o chest pain with SOB for the past 1.5hrs PTA.. Pt skin is warm and dry, respirations WNL.Marland Kitchen Denies N/V.Marland Kitchen

## 2016-08-24 NOTE — ED Notes (Signed)
Electronic signature pad not working at this time. Patient signed paper copy. Placed in chart.

## 2016-08-24 NOTE — ED Notes (Signed)
Patient transported to CT 

## 2016-08-26 DIAGNOSIS — I10 Essential (primary) hypertension: Secondary | ICD-10-CM | POA: Insufficient documentation

## 2016-08-26 DIAGNOSIS — E119 Type 2 diabetes mellitus without complications: Secondary | ICD-10-CM | POA: Diagnosis not present

## 2016-08-26 DIAGNOSIS — M5136 Other intervertebral disc degeneration, lumbar region: Secondary | ICD-10-CM | POA: Diagnosis not present

## 2016-08-26 DIAGNOSIS — R0789 Other chest pain: Secondary | ICD-10-CM | POA: Diagnosis not present

## 2016-09-07 DIAGNOSIS — M5126 Other intervertebral disc displacement, lumbar region: Secondary | ICD-10-CM | POA: Diagnosis not present

## 2016-09-07 DIAGNOSIS — M5416 Radiculopathy, lumbar region: Secondary | ICD-10-CM | POA: Diagnosis not present

## 2016-09-07 DIAGNOSIS — M48062 Spinal stenosis, lumbar region with neurogenic claudication: Secondary | ICD-10-CM | POA: Diagnosis not present

## 2016-09-07 DIAGNOSIS — M1611 Unilateral primary osteoarthritis, right hip: Secondary | ICD-10-CM | POA: Diagnosis not present

## 2016-09-10 DIAGNOSIS — M48062 Spinal stenosis, lumbar region with neurogenic claudication: Secondary | ICD-10-CM | POA: Diagnosis not present

## 2016-09-10 DIAGNOSIS — M4726 Other spondylosis with radiculopathy, lumbar region: Secondary | ICD-10-CM | POA: Diagnosis not present

## 2016-09-10 DIAGNOSIS — M4316 Spondylolisthesis, lumbar region: Secondary | ICD-10-CM | POA: Diagnosis not present

## 2016-09-10 DIAGNOSIS — M4306 Spondylolysis, lumbar region: Secondary | ICD-10-CM | POA: Diagnosis not present

## 2016-09-17 DIAGNOSIS — M1611 Unilateral primary osteoarthritis, right hip: Secondary | ICD-10-CM | POA: Diagnosis not present

## 2016-09-24 DIAGNOSIS — E119 Type 2 diabetes mellitus without complications: Secondary | ICD-10-CM | POA: Diagnosis not present

## 2016-09-24 DIAGNOSIS — M5136 Other intervertebral disc degeneration, lumbar region: Secondary | ICD-10-CM | POA: Diagnosis not present

## 2016-09-24 DIAGNOSIS — G8929 Other chronic pain: Secondary | ICD-10-CM | POA: Diagnosis not present

## 2016-09-24 DIAGNOSIS — M25551 Pain in right hip: Secondary | ICD-10-CM | POA: Diagnosis not present

## 2016-09-24 DIAGNOSIS — I10 Essential (primary) hypertension: Secondary | ICD-10-CM | POA: Diagnosis not present

## 2016-09-29 ENCOUNTER — Emergency Department: Payer: Managed Care, Other (non HMO)

## 2016-09-29 ENCOUNTER — Encounter: Payer: Self-pay | Admitting: *Deleted

## 2016-09-29 ENCOUNTER — Inpatient Hospital Stay
Admission: EM | Admit: 2016-09-29 | Discharge: 2016-10-01 | DRG: 392 | Disposition: A | Discharge status: Home / Self Care | Payer: Managed Care, Other (non HMO) | Attending: Surgery | Admitting: Surgery

## 2016-09-29 DIAGNOSIS — I10 Essential (primary) hypertension: Secondary | ICD-10-CM | POA: Diagnosis present

## 2016-09-29 DIAGNOSIS — M5136 Other intervertebral disc degeneration, lumbar region: Secondary | ICD-10-CM | POA: Diagnosis not present

## 2016-09-29 DIAGNOSIS — Z7982 Long term (current) use of aspirin: Secondary | ICD-10-CM

## 2016-09-29 DIAGNOSIS — Z6841 Body Mass Index (BMI) 40.0 and over, adult: Secondary | ICD-10-CM | POA: Diagnosis not present

## 2016-09-29 DIAGNOSIS — Z7984 Long term (current) use of oral hypoglycemic drugs: Secondary | ICD-10-CM | POA: Diagnosis not present

## 2016-09-29 DIAGNOSIS — K5720 Diverticulitis of large intestine with perforation and abscess without bleeding: Principal | ICD-10-CM | POA: Diagnosis present

## 2016-09-29 DIAGNOSIS — K5732 Diverticulitis of large intestine without perforation or abscess without bleeding: Secondary | ICD-10-CM | POA: Diagnosis not present

## 2016-09-29 DIAGNOSIS — M48061 Spinal stenosis, lumbar region without neurogenic claudication: Secondary | ICD-10-CM | POA: Diagnosis not present

## 2016-09-29 DIAGNOSIS — E669 Obesity, unspecified: Secondary | ICD-10-CM | POA: Diagnosis not present

## 2016-09-29 DIAGNOSIS — E119 Type 2 diabetes mellitus without complications: Secondary | ICD-10-CM | POA: Diagnosis not present

## 2016-09-29 DIAGNOSIS — Z79899 Other long term (current) drug therapy: Secondary | ICD-10-CM | POA: Diagnosis not present

## 2016-09-29 DIAGNOSIS — K760 Fatty (change of) liver, not elsewhere classified: Secondary | ICD-10-CM | POA: Diagnosis not present

## 2016-09-29 DIAGNOSIS — M4726 Other spondylosis with radiculopathy, lumbar region: Secondary | ICD-10-CM | POA: Diagnosis not present

## 2016-09-29 LAB — CBC
HCT: 44.4 % (ref 35.0–47.0)
HEMOGLOBIN: 15.3 g/dL (ref 12.0–16.0)
MCH: 31.5 pg (ref 26.0–34.0)
MCHC: 34.4 g/dL (ref 32.0–36.0)
MCV: 91.5 fL (ref 80.0–100.0)
PLATELETS: 265 10*3/uL (ref 150–440)
RBC: 4.85 MIL/uL (ref 3.80–5.20)
RDW: 13 % (ref 11.5–14.5)
WBC: 10.4 10*3/uL (ref 3.6–11.0)

## 2016-09-29 LAB — COMPREHENSIVE METABOLIC PANEL
ALK PHOS: 79 U/L (ref 38–126)
ALT: 41 U/L (ref 14–54)
AST: 29 U/L (ref 15–41)
Albumin: 3.9 g/dL (ref 3.5–5.0)
Anion gap: 10 (ref 5–15)
BILIRUBIN TOTAL: 0.9 mg/dL (ref 0.3–1.2)
BUN: 14 mg/dL (ref 6–20)
CALCIUM: 9.1 mg/dL (ref 8.9–10.3)
CO2: 26 mmol/L (ref 22–32)
CREATININE: 0.73 mg/dL (ref 0.44–1.00)
Chloride: 104 mmol/L (ref 101–111)
Glucose, Bld: 182 mg/dL — ABNORMAL HIGH (ref 65–99)
Potassium: 4 mmol/L (ref 3.5–5.1)
SODIUM: 140 mmol/L (ref 135–145)
TOTAL PROTEIN: 7.6 g/dL (ref 6.5–8.1)

## 2016-09-29 LAB — URINALYSIS, COMPLETE (UACMP) WITH MICROSCOPIC
BILIRUBIN URINE: NEGATIVE
Bacteria, UA: NONE SEEN
GLUCOSE, UA: 50 mg/dL — AB
Hgb urine dipstick: NEGATIVE
Ketones, ur: 20 mg/dL — AB
NITRITE: NEGATIVE
PH: 5 (ref 5.0–8.0)
Protein, ur: NEGATIVE mg/dL
SPECIFIC GRAVITY, URINE: 1.031 — AB (ref 1.005–1.030)

## 2016-09-29 LAB — GLUCOSE, CAPILLARY: GLUCOSE-CAPILLARY: 271 mg/dL — AB (ref 65–99)

## 2016-09-29 LAB — LIPASE, BLOOD: Lipase: 18 U/L (ref 11–51)

## 2016-09-29 MED ORDER — MORPHINE SULFATE (PF) 2 MG/ML IV SOLN: 2.0000 mg | INTRAVENOUS | Status: DC | PRN

## 2016-09-29 MED ORDER — KETOROLAC TROMETHAMINE 30 MG/ML IJ SOLN
30.0000 mg | Freq: Four times a day (QID) | INTRAMUSCULAR | Status: DC | PRN
  Administered 2016-09-30: 30 mg via INTRAVENOUS
  Filled 2016-09-29: qty 1

## 2016-09-29 MED ORDER — SODIUM CHLORIDE 0.9 % IV BOLUS (SEPSIS)
1000.0000 mL | Freq: Once | INTRAVENOUS | Status: AC
  Administered 2016-09-29: 1000 mL via INTRAVENOUS

## 2016-09-29 MED ORDER — IOPAMIDOL (ISOVUE-300) INJECTION 61%
30.0000 mL | Freq: Once | INTRAVENOUS | Status: AC | PRN
  Administered 2016-09-29: 30 mL via ORAL

## 2016-09-29 MED ORDER — LACTATED RINGERS IV SOLN
INTRAVENOUS | Status: DC
  Administered 2016-09-29 – 2016-10-01 (×6): via INTRAVENOUS

## 2016-09-29 MED ORDER — PANTOPRAZOLE SODIUM 40 MG IV SOLR
40.0000 mg | Freq: Every day | INTRAVENOUS | Status: DC
  Administered 2016-09-29 – 2016-09-30 (×2): 40 mg via INTRAVENOUS
  Filled 2016-09-29 (×2): qty 40

## 2016-09-29 MED ORDER — PNEUMOCOCCAL VAC POLYVALENT 25 MCG/0.5ML IJ INJ
0.5000 mL | INJECTION | INTRAMUSCULAR | Status: AC
  Administered 2016-09-30: 0.5 mL via INTRAMUSCULAR
  Filled 2016-09-29: qty 0.5

## 2016-09-29 MED ORDER — IOPAMIDOL (ISOVUE-300) INJECTION 61%
100.0000 mL | Freq: Once | INTRAVENOUS | Status: AC | PRN
  Administered 2016-09-29: 100 mL via INTRAVENOUS

## 2016-09-29 MED ORDER — HYDRALAZINE HCL 20 MG/ML IJ SOLN: 10.0000 mg | INTRAMUSCULAR | Status: DC | PRN

## 2016-09-29 MED ORDER — CIPROFLOXACIN IN D5W 400 MG/200ML IV SOLN
400.0000 mg | Freq: Once | INTRAVENOUS | Status: AC
  Administered 2016-09-29: 400 mg via INTRAVENOUS
  Filled 2016-09-29 (×2): qty 200

## 2016-09-29 MED ORDER — INSULIN ASPART 100 UNIT/ML ~~LOC~~ SOLN
3.0000 [IU] | Freq: Three times a day (TID) | SUBCUTANEOUS | Status: DC
  Administered 2016-09-30 – 2016-10-01 (×5): 3 [IU] via SUBCUTANEOUS
  Filled 2016-09-29 (×5): qty 3

## 2016-09-29 MED ORDER — INSULIN ASPART 100 UNIT/ML ~~LOC~~ SOLN
0.0000 [IU] | Freq: Three times a day (TID) | SUBCUTANEOUS | Status: DC
  Administered 2016-09-29: 11 [IU] via SUBCUTANEOUS
  Administered 2016-09-30: 4 [IU] via SUBCUTANEOUS
  Administered 2016-09-30: 7 [IU] via SUBCUTANEOUS
  Administered 2016-09-30 – 2016-10-01 (×3): 4 [IU] via SUBCUTANEOUS
  Administered 2016-10-01: 7 [IU] via SUBCUTANEOUS
  Filled 2016-09-29 (×2): qty 4
  Filled 2016-09-29: qty 11
  Filled 2016-09-29: qty 7
  Filled 2016-09-29: qty 4
  Filled 2016-09-29: qty 7
  Filled 2016-09-29: qty 4

## 2016-09-29 MED ORDER — KETOROLAC TROMETHAMINE 30 MG/ML IJ SOLN
15.0000 mg | Freq: Once | INTRAMUSCULAR | Status: AC
  Administered 2016-09-29: 15 mg via INTRAVENOUS
  Filled 2016-09-29: qty 1

## 2016-09-29 MED ORDER — ONDANSETRON 4 MG PO TBDP: 4.0000 mg | ORAL_TABLET | Freq: Four times a day (QID) | ORAL | Status: DC | PRN

## 2016-09-29 MED ORDER — ONDANSETRON HCL 4 MG/2ML IJ SOLN
4.0000 mg | Freq: Four times a day (QID) | INTRAMUSCULAR | Status: DC | PRN
Start: 2016-09-29 — End: 2016-10-01

## 2016-09-29 MED ORDER — INSULIN ASPART 100 UNIT/ML ~~LOC~~ SOLN: 0.0000 [IU] | Freq: Three times a day (TID) | SUBCUTANEOUS | Status: DC

## 2016-09-29 MED ORDER — METRONIDAZOLE IN NACL 5-0.79 MG/ML-% IV SOLN
500.0000 mg | Freq: Once | INTRAVENOUS | Status: AC
  Administered 2016-09-29: 500 mg via INTRAVENOUS
  Filled 2016-09-29: qty 100

## 2016-09-29 MED ORDER — PIPERACILLIN-TAZOBACTAM 3.375 G IVPB
3.3750 g | Freq: Three times a day (TID) | INTRAVENOUS | Status: DC
  Administered 2016-09-29 – 2016-10-01 (×5): 3.375 g via INTRAVENOUS
  Filled 2016-09-29 (×7): qty 50

## 2016-09-29 MED ORDER — ENOXAPARIN SODIUM 40 MG/0.4ML ~~LOC~~ SOLN
40.0000 mg | Freq: Two times a day (BID) | SUBCUTANEOUS | Status: DC
  Administered 2016-09-29 – 2016-10-01 (×4): 40 mg via SUBCUTANEOUS
  Filled 2016-09-29 (×4): qty 0.4

## 2016-09-29 NOTE — Progress Notes (Signed)
Anticoagulation monitoring(Lovenox):  66 yo female ordered Lovenox 40 mg Q24h  Filed Weights   09/29/16 1611  Weight: 238 lb (108 kg)   BMI 42.2    Lab Results  Component Value Date   CREATININE 0.73 09/29/2016   CREATININE 0.86 08/24/2016   CREATININE 0.63 07/03/2015   Estimated Creatinine Clearance: 82.6 mL/min (by C-G formula based on SCr of 0.73 mg/dL). Hemoglobin & Hematocrit     Component Value Date/Time   HGB 15.3 09/29/2016 1610   HGB 13.9 05/30/2013 0520   HCT 44.4 09/29/2016 1610   HCT 40.8 05/30/2013 0520     Per Protocol for Patient with estCrcl > 30 ml/min and BMI > 40, will transition to Lovenox 40 mg Q12h.

## 2016-09-29 NOTE — ED Notes (Signed)
Pt unable to void at this time. 

## 2016-09-29 NOTE — ED Notes (Signed)
ED Provider at bedside. 

## 2016-09-29 NOTE — ED Triage Notes (Signed)
Pt has left lower abd pain.  Pt states it hurts worse after eating.  No n/v/d.  Pt alert.   Speech clear.

## 2016-09-29 NOTE — ED Provider Notes (Signed)
[facility] Emergency Department Provider Note  Time seen: 4:56 PM  I have reviewed the triage vital signs and the nursing notes.   HISTORY  Chief Complaint Abdominal Pain    HPI Kara [name] is a 66 y.o. female With a past medical history of diabetes, hypertension, presents to the emergency department for 4 days of left lower quadrant abdominal pain. According to the patient for the past 4 days she's had intermittent left lower quadrant abdominal pain, somewhat worse after eating. States she has been somewhat constipated over the past few days as well but is having small daily bowel movements. Denies any nausea or vomiting. Denies any fever, dysuria.largely negative review of systems.  Past Medical History:  Diagnosis Date  . Diabetes mellitus without complication ([place])   . Hypertension     Patient Active Problem List   Diagnosis Date Noted  . Acute non-recurrent maxillary sinusitis 05/21/2016  . Acute hip pain, left 02/23/2016  . Chronic low back pain without sciatica 11/14/2015  . Cough 07/30/2015  . Nocturnal leg cramps 02/20/2015  . Diabetes mellitus type 2 in obese ([place]) 11/12/2014  . BP (high blood pressure) 11/12/2014    Past Surgical History:  Procedure Laterality Date  . CARPAL TUNNEL RELEASE Bilateral   . KNEE SURGERY     torn menicus    Prior to Admission medications   Medication Sig Start Date End Date Taking? Authorizing Provider  albuterol (PROVENTIL HFA;VENTOLIN HFA) 108 (90 Base) MCG/ACT inhaler Inhale 2 puffs into the lungs every 6 (six) hours as needed for wheezing or shortness of breath. Patient not taking: Reported on 08/24/2016 06/10/16   Laban Emperor, PA-C  aspirin EC 81 MG tablet Take 81 mg by mouth daily.    [provider]  azithromycin (ZITHROMAX) 250 MG tablet 2 tabs po day 1 then 1 tab po q day x 4 days Patient not taking: Reported on 08/24/2016 05/21/16   Roselee Nova, MD   chlorpheniramine-HYDROcodone [facility] PENNKINETIC ER) 10-8 MG/5ML SUER Take 5 mLs by mouth every 12 (twelve) hours as needed for cough. Patient not taking: Reported on 08/24/2016 05/28/16   Roselee Nova, MD  cyclobenzaprine (FLEXERIL) 10 MG tablet Take 10 mg by mouth daily as needed. 07/05/16   [provider]  diclofenac (VOLTAREN) 75 MG EC tablet Take 75 mg by mouth daily. 07/05/16   [provider]  glipiZIDE (GLUCOTROL) 5 MG tablet Take 0.5 tablets (2.5 mg total) by mouth daily before breakfast. 02/04/16   Roselee Nova, MD  lisinopril (PRINIVIL,ZESTRIL) 20 MG tablet Take 1 tablet (20 mg total) by mouth daily. Patient not taking: Reported on 08/24/2016 02/04/16   Roselee Nova, MD  losartan (COZAAR) 50 MG tablet Take 50 mg by mouth daily. 07/21/16   [provider]  Multiple Vitamin (MULTI-VITAMINS) TABS Take 1 tablet by mouth daily.    [provider]    No Known Allergies  Family History  Problem Relation Age of Onset  . Diabetes Mother   . Heart disease Mother   . Cancer Mother   . Heart disease Father   . Diabetes Sister   . Heart disease Sister     Social History Social History  Substance Use Topics  . Smoking status: Never Smoker  . Smokeless tobacco: Never Used  . Alcohol use No    Review of Systems Constitutional: Negative for fever. Eyes: Negative for visual changes. ENT: Negative for congestion Cardiovascular:  Negative for chest pain. Respiratory: Negative for shortness of breath. Gastrointestinal: Moderate left lower quadrant pain. Negative for N/V/D Genitourinary: Negative for dysuria. Musculoskeletal: Negative for back pain. Skin: Negative for rash. Neurological: Negative for headache All other ROS negative  ____________________________________________   PHYSICAL EXAM:  VITAL SIGNS: ED Triage Vitals  Enc Vitals Group     BP 09/29/16 1611 (!) 150/73     Pulse Rate 09/29/16 1611 96     Resp 09/29/16 1611 20      Temp 09/29/16 1611 98 F (36.7 C)     Temp Source 09/29/16 1611 Oral     SpO2 09/29/16 1611 99 %     Weight 09/29/16 1611 238 lb (108 kg)     Height 09/29/16 1611 '5\' 3"'$  (1.6 m)     Head Circumference --      Peak Flow --      Pain Score 09/29/16 1610 8     Pain Loc --      Pain Edu? --      Excl. in [place]? --     Constitutional: Alert and oriented. Well appearing and in no distress. Eyes: Normal exam ENT   Head: Normocephalic and atraumatic.   Mouth/Throat: Mucous membranes are moist. Cardiovascular: Normal rate, regular rhythm. No murmur Respiratory: Normal respiratory effort without tachypnea nor retractions. Breath sounds are clear Gastrointestinal: soft, moderate left lower quadrant abdominal tenderness palpation without rebound or guarding. No distention Musculoskeletal: Nontender with normal range of motion in all extremities.  Neurologic:  Normal speech and language. No gross focal neurologic deficits are appreciated. Skin:  Skin is warm, dry and intact.  Psychiatric: Mood and affect are normal.   ____________________________________________     RADIOLOGY  IMPRESSION: Mild sigmoid diverticulitis with microperforation. No evidence of abscess.  Noncalcified gallstones versus gallbladder neoplasm. Recommend right upper quadrant abdomen ultrasound for further evaluation.  Mild to moderate hepatic steatosis.  ____________________________________________   INITIAL IMPRESSION / ASSESSMENT AND PLAN / ED COURSE  Pertinent labs & imaging results that were available during my care of the patient were reviewed by me and considered in my medical decision making (see chart for details).  patient presents to the emergency department for left lower quadrant tenderness over the past 4 days. Moderate tenderness on exam. Denies any nausea vomiting or diarrhea.  labs are largely within normal limits, urinalysis pending. We'll obtain a CT scan of the abdomen/pelvis to  further evaluate.  CT consistent with diverticulitis with microperforation. I discussed with general surgery who will be down to see the patient. We will start IV Cipro and Flagyl.  ____________________________________________   FINAL CLINICAL IMPRESSION(S) / ED DIAGNOSES  left lower quadrant abdominal pain Diverticulitis with microperforation   Harvest Dark, MD 09/29/16 2032

## 2016-09-29 NOTE — H&P (Addendum)
Patient ID: Kara [name], female   DOB: Apr 14, 1951, 66 y.o.   MRN: [id number]  HPI Kara [name] is a 66 y.o. female presented to the emergency room with 4 days of abdominal pain located in the left lower quadrant. She reports that the pain radiated to the suprapubic area, is intermittent and moderate in intensity. Pain is aggravated by meals and with certain movements. Pain is alleviated by applying a heating pad. No fevers no chills. She actually had something to eat from a dose this afternoon after having an epidural steroid injection for some chronic pain secondary to back issues. CT scan I have personally reviewed there is evidence of diverticulitis with a few bubbles of extraluminal air. There is no frank free air or abscess. CBC is 10, creatinine is normal and blood sugar is in the 180s. She has never had any episodes like this before  HPI  Past Medical History:  Diagnosis Date  . Diabetes mellitus without complication ([place])   . Hypertension     Past Surgical History:  Procedure Laterality Date  . CARPAL TUNNEL RELEASE Bilateral   . KNEE SURGERY     torn menicus    Family History  Problem Relation Age of Onset  . Diabetes Mother   . Heart disease Mother   . Cancer Mother   . Heart disease Father   . Diabetes Sister   . Heart disease Sister     Social History Social History  Substance Use Topics  . Smoking status: Never Smoker  . Smokeless tobacco: Never Used  . Alcohol use No    No Known Allergies  Current Facility-Administered Medications  Medication Dose Route Frequency Provider Last Rate Last Dose  . ciprofloxacin (CIPRO) IVPB 400 mg  400 mg Intravenous Once Harvest Dark, MD      . enoxaparin (LOVENOX) injection 40 mg  40 mg Subcutaneous Q24H Kyleena Scheirer F, MD      . hydrALAZINE (APRESOLINE) injection 10 mg  10 mg Intravenous Q2H PRN Lorilee Cafarella F, MD      . Derrill Memo ON 09/30/2016] insulin aspart (novoLOG) injection 0-20 Units  0-20  Units Subcutaneous TID WC Bibi Economos F, MD      . Derrill Memo ON 09/30/2016] insulin aspart (novoLOG) injection 3 Units  3 Units Subcutaneous TID WC Marytza Grandpre F, MD      . ketorolac (TORADOL) 30 MG/ML injection 30 mg  30 mg Intravenous Q6H PRN Josee Speece F, MD      . lactated ringers infusion   Intravenous Continuous Amisadai Woodford F, MD      . metroNIDAZOLE (FLAGYL) IVPB 500 mg  500 mg Intravenous Once Harvest Dark, MD 100 mL/hr at 09/29/16 1942 500 mg at 09/29/16 1942  . morphine 2 MG/ML injection 2 mg  2 mg Intravenous Q2H PRN Mekiah Wahler F, MD      . ondansetron (ZOFRAN-ODT) disintegrating tablet 4 mg  4 mg Oral Q6H PRN Harriett Azar F, MD       Or  . ondansetron (ZOFRAN) injection 4 mg  4 mg Intravenous Q6H PRN Claborn Janusz F, MD      . pantoprazole (PROTONIX) injection 40 mg  40 mg Intravenous QHS Kanitra Purifoy F, MD      . piperacillin-tazobactam (ZOSYN) IVPB 3.375 g  3.375 g Intravenous Q8H Remi Lopata, Marjory Lies, MD       Current Outpatient Prescriptions  Medication Sig Dispense Refill  . aspirin EC 81 MG tablet Take 81 mg by  mouth daily.    . cyclobenzaprine (FLEXERIL) 10 MG tablet Take 10 mg by mouth daily as needed.    . diclofenac (VOLTAREN) 75 MG EC tablet Take 75 mg by mouth daily.    Marland Kitchen glipiZIDE (GLUCOTROL) 5 MG tablet Take 0.5 tablets (2.5 mg total) by mouth daily before breakfast. 90 tablet 0  . losartan (COZAAR) 50 MG tablet Take 50 mg by mouth daily.  0  . Multiple Vitamin (MULTI-VITAMINS) TABS Take 1 tablet by mouth daily.    Marland Kitchen albuterol (PROVENTIL HFA;VENTOLIN HFA) 108 (90 Base) MCG/ACT inhaler Inhale 2 puffs into the lungs every 6 (six) hours as needed for wheezing or shortness of breath. (Patient not taking: Reported on 08/24/2016) 1 Inhaler 0  . azithromycin (ZITHROMAX) 250 MG tablet 2 tabs po day 1 then 1 tab po q day x 4 days (Patient not taking: Reported on 08/24/2016) 6 tablet 0  . chlorpheniramine-HYDROcodone (TUSSIONEX PENNKINETIC ER) 10-8 MG/5ML SUER Take 5 mLs by mouth  every 12 (twelve) hours as needed for cough. (Patient not taking: Reported on 08/24/2016) 140 mL 0  . lisinopril (PRINIVIL,ZESTRIL) 20 MG tablet Take 1 tablet (20 mg total) by mouth daily. (Patient not taking: Reported on 08/24/2016) 90 tablet 0     Review of Systems Full ROS  was asked and was negative except for the information on the HPI  Physical Exam Blood pressure (!) 152/79, pulse 94, temperature 98 F (36.7 C), temperature source Oral, resp. rate 18, height '5\' 3"'$  (1.6 m), weight 108 kg (238 lb), SpO2 94 %. CONSTITUTIONAL: Obese female in NAD, non toxic. EYES: Pupils are equal, round, and reactive to light, Sclera are non-icteric. EARS, NOSE, MOUTH AND THROAT: The oropharynx is clear. The oral mucosa is pink and moist. Hearing is intact to voice. LYMPH NODES:  Lymph nodes in the neck are normal. RESPIRATORY:  Lungs are clear. There is normal respiratory effort, with equal breath sounds bilaterally, and without pathologic use of accessory muscles. CARDIOVASCULAR: Heart is regular without murmurs, gallops, or rubs. GI: The abdomen is  soft, mild to moderate tenderness to palpation LLQ, no peritonitis or rebound. GU: Rectal deferred.   MUSCULOSKELETAL: Normal muscle strength and tone. No cyanosis or edema.   SKIN: Turgor is good and there are no pathologic skin lesions or ulcers. NEUROLOGIC: Motor and sensation is grossly normal. Cranial nerves are grossly intact. PSYCH:  Oriented to person, place and time. Affect is normal.  Data Reviewed  I have personally reviewed the patient's imaging, laboratory findings and medical records.    Assessment/Plan 66 yo obese and diabetic female Diverticulitis with microperforation. On exam there is no evidence of peritonitis and she is not toxic or septic. My plan is to admit her to the hospital for IV hydration, broad-spectrum antibiotics IV and we will keep her nothing by mouth for tonight. We'll perform serial abdominal exams and I will order a CBC  for tomorrow. We'll place her on insulin given the fact that she is nothing by mouth. No need for emergent surgical revision at this time. After lengthy discussion with the patient about her disease process and about possibilities of requiring an emergent operation is her condition does not improve. I did reemphasize that this was unlikely but was have to see how she does clinically on a daily basis. Also discussed with her about the possibility of elective colectomy and colonoscopy in the outpatient setting after this episode resolves. D/w DR. Paduchowski.  Caroleen Hamman, MD FACS General Surgeon 09/29/2016, 7:49  PM   

## 2016-09-30 LAB — BASIC METABOLIC PANEL
Anion gap: 6 (ref 5–15)
BUN: 11 mg/dL (ref 6–20)
CALCIUM: 8.7 mg/dL — AB (ref 8.9–10.3)
CO2: 23 mmol/L (ref 22–32)
CREATININE: 0.68 mg/dL (ref 0.44–1.00)
Chloride: 111 mmol/L (ref 101–111)
GFR calc non Af Amer: >60 mL/min (ref >60)
Glucose, Bld: 234 mg/dL — ABNORMAL HIGH (ref 65–99)
Potassium: 3.7 mmol/L (ref 3.5–5.1)
SODIUM: 140 mmol/L (ref 135–145)

## 2016-09-30 LAB — GLUCOSE, CAPILLARY
GLUCOSE-CAPILLARY: 201 mg/dL — AB (ref 65–99)
Glucose-Capillary: 185 mg/dL — ABNORMAL HIGH (ref 65–99)
Glucose-Capillary: 166 mg/dL — ABNORMAL HIGH (ref 65–99)
Glucose-Capillary: 163 mg/dL — ABNORMAL HIGH (ref 65–99)

## 2016-09-30 LAB — CBC
HCT: 41.9 % (ref 35.0–47.0)
Hemoglobin: 14.4 g/dL (ref 12.0–16.0)
MCH: 31.4 pg (ref 26.0–34.0)
MCHC: 34.3 g/dL (ref 32.0–36.0)
MCV: 91.4 fL (ref 80.0–100.0)
PLATELETS: 246 10*3/uL (ref 150–440)
RBC: 4.58 MIL/uL (ref 3.80–5.20)
RDW: 12.6 % (ref 11.5–14.5)
WBC: 8.4 10*3/uL (ref 3.6–11.0)

## 2016-09-30 NOTE — Plan of Care (Signed)
Problem: Fluid Volume: Goal: Ability to maintain a balanced intake and output will improve Outcome: Progressing Patient will like to eat, reinforced teaching on the need for her to be NPO at this time, she indicated understanding

## 2016-09-30 NOTE — Progress Notes (Signed)
Patient revisited this afternoon. She had an uneventful day. No abdominal pain. States she is hungry.  Plan to start her on clear liquids, advance to regular diet in the morning if tolerated. Plan to start oral antibiotics tomorrow once tolerating a diet.  [name], MD [facility]  Day [facility] [phone] Night [facility] [phone]

## 2016-09-30 NOTE — Progress Notes (Signed)
Inpatient Diabetes Program Recommendations  AACE/ADA: New Consensus Statement on Inpatient Glycemic Control (2015)  Target Ranges:  Prepandial:   less than 140 mg/dL      Peak postprandial:   less than 180 mg/dL (1-2 hours)      Critically ill patients:  140 - 180 mg/dL   Lab Results  Component Value Date   GLUCAP 201 (H) 09/30/2016   HGBA1C 7.0 01/19/2016    Review of Glycemic Control   Results for Kara, [name] (MRN [id number]) as of 09/30/2016 11:38  Ref. Range 09/29/2016 21:03 09/30/2016 07:27  Glucose-Capillary Latest Ref Range: 65 - 99 mg/dL 271 (H) 201 (H)   Diabetes history: Type 2, A1C pending  Outpatient Diabetes medications: Glipizide 2.'5mg'$  qam  Current orders for Inpatient glycemic control: Novolog 3 units tid with meals, Novolog 0-20 units tid/hs  Inpatient Diabetes Program Recommendations:  Consider adding low dose basal insulin (fasting blood sugar '201mg'$ /dl) , consider Lantus 20 units qhs (0.18units/kg)  [name], RN, BA, [place], CDE Diabetes Coordinator Inpatient Diabetes Program  [phone] (Team Pager) [phone] ([place]) 09/30/2016 11:47 AM

## 2016-09-30 NOTE — Progress Notes (Signed)
CC: Diverticulitis Subjective: Patient admitted overnight for diverticulitis with microperforation. She reports that her pain has completely resolved this morning. She is feeling much better. She is hungry and has been passing flatus with normal BMs.  Objective: Vital signs in last 24 hours: Temp:  [97.8 F (36.6 C)-98 F (36.7 C)] 98 F (36.7 C) (05/10 0603) Pulse Rate:  [80-96] 80 (05/10 0603) Resp:  [18-20] 20 (05/10 0603) BP: (137-158)/(67-87) 137/74 (05/10 0603) SpO2:  [94 %-99 %] 95 % (05/10 0603) Weight:  [108 kg (238 lb)-110.5 kg (243 lb 11.2 oz)] 110.5 kg (243 lb 11.2 oz) (05/09 2103) Last BM Date: 09/30/16  Intake/Output from previous day: 05/09 0701 - 05/10 0700 In: 1158.1 [I.V.:1122.4; IV Piggyback:35.8] Out: -  Intake/Output this shift: Total I/O In: 776 [I.V.:726; IV Piggyback:50] Out: 400 [Urine:400]  Physical exam:  Gen.: No acute distress Chest: Clear to auscultation Heart: Regular rhythm Abdomen: Soft, nontender, nondistended.  Lab Results: CBC   Recent Labs  09/29/16 1610 09/30/16 0507  WBC 10.4 8.4  HGB 15.3 14.4  HCT 44.4 41.9  PLT 265 246   BMET  Recent Labs  09/29/16 1610 09/30/16 0507  NA 140 140  K 4.0 3.7  CL 104 111  CO2 26 23  GLUCOSE 182* 234*  BUN 14 11  CREATININE 0.73 0.68  CALCIUM 9.1 8.7*   PT/INR No results for input(s): LABPROT, INR in the last 72 hours. ABG No results for input(s): PHART, HCO3 in the last 72 hours.  Invalid input(s): PCO2, PO2  Studies/Results: Ct Abdomen Pelvis W Contrast  Result Date: 09/29/2016 CLINICAL DATA:  Left lower quadrant pain for 4 days. EXAM: CT ABDOMEN AND PELVIS WITH CONTRAST TECHNIQUE: Multidetector CT imaging of the abdomen and pelvis was performed using the standard protocol following bolus administration of intravenous contrast. CONTRAST:  111m ISOVUE-300 IOPAMIDOL (ISOVUE-300) INJECTION 61% COMPARISON:  None. FINDINGS: Lower Chest: No acute findings. Hepatobiliary: No masses  identified. Mild to moderate hepatic steatosis noted. Rounded density is seen within the gallbladder neck measuring approximately 2.4 cm, which could represent noncalcified gallstones or gallbladder mass. No evidence of acute cholecystitis or biliary dilatation. Pancreas:  No mass or inflammatory changes. Spleen: Within normal limits in size and appearance. Adrenals/Urinary Tract: No masses identified. No evidence of hydronephrosis. Stomach/Bowel: Mild diverticulitis is seen involving the sigmoid colon, with several small extra colonic gas bubbles, consistent with micro perforation. No drainable fluid collections identified. No evidence of bowel obstruction. Vascular/Lymphatic: No pathologically enlarged lymph nodes. No abdominal aortic aneurysm. Reproductive:  No mass or other significant abnormality. Other:  None. Musculoskeletal: No suspicious bone lesions identified. Severe lower lumbar spine degenerative changes are seen with anterolisthesis at L4-5 measuring 8 mm. IMPRESSION: Mild sigmoid diverticulitis with microperforation. No evidence of abscess. Noncalcified gallstones versus gallbladder neoplasm. Recommend right upper quadrant abdomen ultrasound for further evaluation. Mild to moderate hepatic steatosis. Electronically Signed   By: JEarle GellM.D.   On: 09/29/2016 18:29    Anti-infectives: Anti-infectives    Start     Dose/Rate Route Frequency Ordered Stop   09/29/16 2200  piperacillin-tazobactam (ZOSYN) IVPB 3.375 g     3.375 g 12.5 mL/hr over 240 Minutes Intravenous Every 8 hours 09/29/16 1947     09/29/16 1930  ciprofloxacin (CIPRO) IVPB 400 mg     400 mg 200 mL/hr over 60 Minutes Intravenous  Once 09/29/16 1918 09/29/16 2331   09/29/16 1930  metroNIDAZOLE (FLAGYL) IVPB 500 mg     500 mg 100 mL/hr over  60 Minutes Intravenous  Once 09/29/16 1918 09/29/16 2042      Assessment/Plan:  66 year old female admitted with microperforated sigmoid diverticulitis. Has rapidly improved on  antibiotics. Discussed should she remain pain-free throughout the day that we would start a clear liquid diet. If tolerates a diet tonight would transition to oral Avelox tomorrow and advance her diet. Encourage ambulation.  Lisandra Mathisen T. Adonis Huguenin, MD, [facility] General Surgeon [facility]  Day [facility] [phone] Night [facility] [phone] 09/30/2016

## 2016-10-01 LAB — CBC
HEMATOCRIT: 38.2 % (ref 35.0–47.0)
HEMOGLOBIN: 13.3 g/dL (ref 12.0–16.0)
MCH: 31.6 pg (ref 26.0–34.0)
MCHC: 34.8 g/dL (ref 32.0–36.0)
MCV: 90.8 fL (ref 80.0–100.0)
Platelets: 245 10*3/uL (ref 150–440)
RBC: 4.21 MIL/uL (ref 3.80–5.20)
RDW: 12.4 % (ref 11.5–14.5)
WBC: 9.2 10*3/uL (ref 3.6–11.0)

## 2016-10-01 LAB — GLUCOSE, CAPILLARY
GLUCOSE-CAPILLARY: 186 mg/dL — AB (ref 65–99)
Glucose-Capillary: 190 mg/dL — ABNORMAL HIGH (ref 65–99)
Glucose-Capillary: 219 mg/dL — ABNORMAL HIGH (ref 65–99)

## 2016-10-01 LAB — BASIC METABOLIC PANEL
Anion gap: 6 (ref 5–15)
BUN: 14 mg/dL (ref 6–20)
CALCIUM: 8.6 mg/dL — AB (ref 8.9–10.3)
CO2: 23 mmol/L (ref 22–32)
CREATININE: 0.72 mg/dL (ref 0.44–1.00)
Chloride: 112 mmol/L — ABNORMAL HIGH (ref 101–111)
GFR calc Af Amer: >60 mL/min (ref >60)
GFR calc non Af Amer: >60 mL/min (ref >60)
GLUCOSE: 194 mg/dL — AB (ref 65–99)
Potassium: 3.8 mmol/L (ref 3.5–5.1)
Sodium: 141 mmol/L (ref 135–145)

## 2016-10-01 LAB — HEMOGLOBIN A1C
Hgb A1c MFr Bld: 7.2 % — ABNORMAL HIGH (ref 4.8–5.6)
Mean Plasma Glucose: 160 mg/dL

## 2016-10-01 MED ORDER — AMOXICILLIN-POT CLAVULANATE 875-125 MG PO TABS
1.0000 | ORAL_TABLET | Freq: Two times a day (BID) | ORAL | Status: DC
  Administered 2016-10-01: 1 via ORAL
  Filled 2016-10-01: qty 1

## 2016-10-01 MED ORDER — AMOXICILLIN-POT CLAVULANATE 875-125 MG PO TABS: 1.0000 | ORAL_TABLET | Freq: Two times a day (BID) | ORAL | 0 refills | Status: DC

## 2016-10-01 NOTE — Discharge Summary (Signed)
Patient ID: Kara [name] MRN: [id number] DOB/AGE: [birth year] 65 y.o.  Admit date: 09/29/2016 Discharge date: 10/01/2016  Discharge Diagnoses:  Diverticulitis  Procedures Performed: None  Discharged Condition: good  Hospital Course: Admitted with diverticulitis. Symptoms resolved with IV antibiotics. On day of discharge her abdomen was benign and she was tolerating her diet and her antibiotics.  Discharge Orders: Discharge Instructions    Call MD for:  persistant nausea and vomiting    Complete by:  As directed    Call MD for:  severe uncontrolled pain    Complete by:  As directed    Call MD for:  temperature >100.4    Complete by:  As directed    Diet - low sodium heart healthy    Complete by:  As directed    Increase activity slowly    Complete by:  As directed       Disposition: 01-Home or Self Care  Discharge Medications: Allergies as of 10/01/2016   No Known Allergies     Medication List    STOP taking these medications   albuterol 108 (90 Base) MCG/ACT inhaler Commonly known as:  PROVENTIL HFA;VENTOLIN HFA   azithromycin 250 MG tablet Commonly known as:  ZITHROMAX   chlorpheniramine-HYDROcodone 10-8 MG/5ML Suer Commonly known as:  TUSSIONEX PENNKINETIC ER   lisinopril 20 MG tablet Commonly known as:  PRINIVIL,ZESTRIL     TAKE these medications   amoxicillin-clavulanate 875-125 MG tablet Commonly known as:  AUGMENTIN Take 1 tablet by mouth every 12 (twelve) hours.   aspirin EC 81 MG tablet Take 81 mg by mouth daily.   cyclobenzaprine 10 MG tablet Commonly known as:  FLEXERIL Take 10 mg by mouth daily as needed.   diclofenac 75 MG EC tablet Commonly known as:  VOLTAREN Take 75 mg by mouth daily.   glipiZIDE 5 MG tablet Commonly known as:  GLUCOTROL Take 0.5 tablets (2.5 mg total) by mouth daily before breakfast.   losartan 50 MG tablet Commonly known as:  COZAAR Take 50 mg by mouth daily.   MULTI-VITAMINS Tabs Take 1 tablet by  mouth daily.        Follwup: Follow-up [place]. Schedule an appointment as soon as possible for a visit in 2 week(s).   Specialty:  General Surgery Why:  hospital follow up, diverticulitis Contact information: [address] [place] [phone]          Signed: [name] 10/01/2016, 5:45 PM

## 2016-10-01 NOTE — Progress Notes (Signed)
Discharge instructions reviewed with the patient.  Patient sent out via wheelchair to her waiting car 

## 2016-10-01 NOTE — Progress Notes (Signed)
Inpatient Diabetes Program Recommendations  AACE/ADA: New Consensus Statement on Inpatient Glycemic Control (2015)  Target Ranges:  Prepandial:   less than 140 mg/dL      Peak postprandial:   less than 180 mg/dL (1-2 hours)      Critically ill patients:  140 - 180 mg/dL   Lab Results  Component Value Date   GLUCAP 190 (H) 10/01/2016   HGBA1C 7.2 (H) 09/30/2016    Review of Glycemic Control Results for Kara [name], Kara [name] (MRN [id number]) as of 10/01/2016 09:37  Ref. Range 09/30/2016 07:27 09/30/2016 11:38 09/30/2016 16:57 09/30/2016 21:14 10/01/2016 07:36  Glucose-Capillary Latest Ref Range: 65 - 99 mg/dL 201 (H) 185 (H) 166 (H) 163 (H) 190 (H)    Diabetes history: Type 2, A1C 7.2%  Outpatient Diabetes medications: Glipizide 2.'5mg'$  qam  Current orders for Inpatient glycemic control: Novolog 3 units tid with meals, Novolog 0-20 units tid/hs  Inpatient Diabetes Program Recommendations:  Consider adding low dose basal insulin (fasting blood sugar '190mg'$ /dl) , consider Lantus 11 units qday (0.10units/kg)  [name], RN, BA, MHA, CDE Diabetes Coordinator Inpatient Diabetes Program  [phone] (Team Pager) [phone] ([place]) 10/01/2016 9:39 AM

## 2016-10-01 NOTE — Discharge Instructions (Signed)
Diverticulitis °Diverticulitis is inflammation or infection of small pouches in your colon that form when you have a condition called diverticulosis. The pouches in your colon are called diverticula. Your colon, or large intestine, is where water is absorbed and stool is formed. °Complications of diverticulitis can include: °· Bleeding. °· Severe infection. °· Severe pain. °· Perforation of your colon. °· Obstruction of your colon. ° °What are the causes? °Diverticulitis is caused by bacteria. °Diverticulitis happens when stool becomes trapped in diverticula. This allows bacteria to grow in the diverticula, which can lead to inflammation and infection. °What increases the risk? °People with diverticulosis are at risk for diverticulitis. Eating a diet that does not include enough fiber from fruits and vegetables may make diverticulitis more likely to develop. °What are the signs or symptoms? °Symptoms of diverticulitis may include: °· Abdominal pain and tenderness. The pain is normally located on the left side of the abdomen, but may occur in other areas. °· Fever and chills. °· Bloating. °· Cramping. °· Nausea. °· Vomiting. °· Constipation. °· Diarrhea. °· Blood in your stool. ° °How is this diagnosed? °Your health care provider will ask you about your medical history and do a physical exam. You may need to have tests done because many medical conditions can cause the same symptoms as diverticulitis. Tests may include: °· Blood tests. °· Urine tests. °· Imaging tests of the abdomen, including X-rays and CT scans. ° °When your condition is under control, your health care provider may recommend that you have a colonoscopy. A colonoscopy can show how severe your diverticula are and whether something else is causing your symptoms. °How is this treated? °Most cases of diverticulitis are mild and can be treated at home. Treatment may include: °· Taking over-the-counter pain medicines. °· Following a clear liquid  diet. °· Taking antibiotic medicines by mouth for 7-10 days. ° °More severe cases may be treated at a hospital. Treatment may include: °· Not eating or drinking. °· Taking prescription pain medicine. °· Receiving antibiotic medicines through an IV tube. °· Receiving fluids and nutrition through an IV tube. °· Surgery. ° °Follow these instructions at home: °· Follow your health care provider’s instructions carefully. °· Follow a full liquid diet or other diet as directed by your health care provider. After your symptoms improve, your health care provider may tell you to change your diet. He or she may recommend you eat a high-fiber diet. Fruits and vegetables are good sources of fiber. Fiber makes it easier to pass stool. °· Take fiber supplements or probiotics as directed by your health care provider. °· Only take medicines as directed by your health care provider. °· Keep all your follow-up appointments. °Contact a health care provider if: °· Your pain does not improve. °· You have a hard time eating food. °· Your bowel movements do not return to normal. °Get help right away if: °· Your pain becomes worse. °· Your symptoms do not get better. °· Your symptoms suddenly get worse. °· You have a fever. °· You have repeated vomiting. °· You have bloody or black, tarry stools. °This information is not intended to replace advice given to you by your health care provider. Make sure you discuss any questions you have with your health care provider. °Document Released: 02/17/2005 Document Revised: 10/16/2015 Document Reviewed: 04/04/2013 °Elsevier Interactive Patient Education © 2017 [employer] ° °

## 2016-10-01 NOTE — Progress Notes (Signed)
CC: Diverticulitis Subjective: Patient tolerated her clear liquid diet last night without any return of her abdominal pain. Desires to eat something more solid. No active complaints.  Objective: Vital signs in last 24 hours: Temp:  [98.1 F (36.7 C)-98.2 F (36.8 C)] 98.1 F (36.7 C) (05/11 0524) Pulse Rate:  [78-87] 87 (05/11 0524) Resp:  [18-20] 18 (05/11 0524) BP: (142-157)/(65-70) 142/65 (05/11 0524) SpO2:  [94 %-98 %] 94 % (05/11 0524) Last BM Date: 10/01/16  Intake/Output from previous day: 05/10 0701 - 05/11 0700 In: 4166.7 [P.O.:540; I.V.:3476.2; IV Piggyback:150.5] Out: 2425 [Urine:2425] Intake/Output this shift: Total I/O In: -  Out: 200 [Urine:200]  Physical exam:  Gen.: No acute distress Chest: Clear to auscultation Heart: Regular rhythm Abdomen: Soft, nontender, nondistended.  Lab Results: CBC   Recent Labs  09/30/16 0507 10/01/16 0433  WBC 8.4 9.2  HGB 14.4 13.3  HCT 41.9 38.2  PLT 246 245   BMET  Recent Labs  09/30/16 0507 10/01/16 0433  NA 140 141  K 3.7 3.8  CL 111 112*  CO2 23 23  GLUCOSE 234* 194*  BUN 11 14  CREATININE 0.68 0.72  CALCIUM 8.7* 8.6*   PT/INR No results for input(s): LABPROT, INR in the last 72 hours. ABG No results for input(s): PHART, HCO3 in the last 72 hours.  Invalid input(s): PCO2, PO2  Studies/Results: Ct Abdomen Pelvis W Contrast  Result Date: 09/29/2016 CLINICAL DATA:  Left lower quadrant pain for 4 days. EXAM: CT ABDOMEN AND PELVIS WITH CONTRAST TECHNIQUE: Multidetector CT imaging of the abdomen and pelvis was performed using the standard protocol following bolus administration of intravenous contrast. CONTRAST:  152m ISOVUE-300 IOPAMIDOL (ISOVUE-300) INJECTION 61% COMPARISON:  None. FINDINGS: Lower Chest: No acute findings. Hepatobiliary: No masses identified. Mild to moderate hepatic steatosis noted. Rounded density is seen within the gallbladder neck measuring approximately 2.4 cm, which could  represent noncalcified gallstones or gallbladder mass. No evidence of acute cholecystitis or biliary dilatation. Pancreas:  No mass or inflammatory changes. Spleen: Within normal limits in size and appearance. Adrenals/Urinary Tract: No masses identified. No evidence of hydronephrosis. Stomach/Bowel: Mild diverticulitis is seen involving the sigmoid colon, with several small extra colonic gas bubbles, consistent with micro perforation. No drainable fluid collections identified. No evidence of bowel obstruction. Vascular/Lymphatic: No pathologically enlarged lymph nodes. No abdominal aortic aneurysm. Reproductive:  No mass or other significant abnormality. Other:  None. Musculoskeletal: No suspicious bone lesions identified. Severe lower lumbar spine degenerative changes are seen with anterolisthesis at L4-5 measuring 8 mm. IMPRESSION: Mild sigmoid diverticulitis with microperforation. No evidence of abscess. Noncalcified gallstones versus gallbladder neoplasm. Recommend right upper quadrant abdomen ultrasound for further evaluation. Mild to moderate hepatic steatosis. Electronically Signed   By: JEarle GellM.D.   On: 09/29/2016 18:29    Anti-infectives: Anti-infectives    Start     Dose/Rate Route Frequency Ordered Stop   10/01/16 1030  amoxicillin-clavulanate (AUGMENTIN) 875-125 MG per tablet 1 tablet     1 tablet Oral Every 12 hours 10/01/16 1020     09/29/16 2200  piperacillin-tazobactam (ZOSYN) IVPB 3.375 g  Status:  Discontinued     3.375 g 12.5 mL/hr over 240 Minutes Intravenous Every 8 hours 09/29/16 1947 10/01/16 1020   09/29/16 1930  ciprofloxacin (CIPRO) IVPB 400 mg     400 mg 200 mL/hr over 60 Minutes Intravenous  Once 09/29/16 1918 09/29/16 2331   09/29/16 1930  metroNIDAZOLE (FLAGYL) IVPB 500 mg     500 mg 100  mL/hr over 60 Minutes Intravenous  Once 09/29/16 1918 09/29/16 2042      Assessment/Plan:  66 year old female admitted for diverticulitis with microperforation. Has  clinically completely resolved on antibiotics. Plan to advance diet today. Transition to oral antibiotics. If tolerates oral antibiotic will be safe for discharge later today. Encourage ambulation and incentive spirometer usage.  Koya Hunger T. Adonis Huguenin, MD, [facility] General Surgeon [facility]  Day [facility] [phone] Night [facility] [phone] 10/01/2016

## 2016-10-03 ENCOUNTER — Inpatient Hospital Stay
Admission: EM | Admit: 2016-10-03 | Discharge: 2016-10-06 | DRG: 392 | Disposition: A | Discharge status: Home / Self Care | Payer: Managed Care, Other (non HMO) | Attending: General Surgery | Admitting: General Surgery

## 2016-10-03 ENCOUNTER — Emergency Department: Payer: Managed Care, Other (non HMO)

## 2016-10-03 DIAGNOSIS — E119 Type 2 diabetes mellitus without complications: Secondary | ICD-10-CM | POA: Diagnosis not present

## 2016-10-03 DIAGNOSIS — K5792 Diverticulitis of intestine, part unspecified, without perforation or abscess without bleeding: Secondary | ICD-10-CM | POA: Diagnosis not present

## 2016-10-03 DIAGNOSIS — Z7984 Long term (current) use of oral hypoglycemic drugs: Secondary | ICD-10-CM | POA: Diagnosis not present

## 2016-10-03 DIAGNOSIS — K5720 Diverticulitis of large intestine with perforation and abscess without bleeding: Secondary | ICD-10-CM | POA: Diagnosis not present

## 2016-10-03 DIAGNOSIS — Z833 Family history of diabetes mellitus: Secondary | ICD-10-CM

## 2016-10-03 DIAGNOSIS — K5732 Diverticulitis of large intestine without perforation or abscess without bleeding: Principal | ICD-10-CM | POA: Diagnosis present

## 2016-10-03 DIAGNOSIS — Z7982 Long term (current) use of aspirin: Secondary | ICD-10-CM

## 2016-10-03 DIAGNOSIS — Z8249 Family history of ischemic heart disease and other diseases of the circulatory system: Secondary | ICD-10-CM

## 2016-10-03 DIAGNOSIS — Z6841 Body Mass Index (BMI) 40.0 and over, adult: Secondary | ICD-10-CM | POA: Diagnosis not present

## 2016-10-03 DIAGNOSIS — I10 Essential (primary) hypertension: Secondary | ICD-10-CM | POA: Diagnosis not present

## 2016-10-03 DIAGNOSIS — R1032 Left lower quadrant pain: Secondary | ICD-10-CM | POA: Diagnosis not present

## 2016-10-03 LAB — COMPREHENSIVE METABOLIC PANEL
ALK PHOS: 68 U/L (ref 38–126)
ALT: 51 U/L (ref 14–54)
ANION GAP: 8 (ref 5–15)
AST: 29 U/L (ref 15–41)
Albumin: 3.5 g/dL (ref 3.5–5.0)
BUN: 16 mg/dL (ref 6–20)
CALCIUM: 8.6 mg/dL — AB (ref 8.9–10.3)
CO2: 22 mmol/L (ref 22–32)
Chloride: 109 mmol/L (ref 101–111)
Creatinine, Ser: 0.7 mg/dL (ref 0.44–1.00)
GFR calc Af Amer: >60 mL/min (ref >60)
GFR calc non Af Amer: >60 mL/min (ref >60)
GLUCOSE: 223 mg/dL — AB (ref 65–99)
Potassium: 3.3 mmol/L — ABNORMAL LOW (ref 3.5–5.1)
SODIUM: 139 mmol/L (ref 135–145)
Total Bilirubin: 0.8 mg/dL (ref 0.3–1.2)
Total Protein: 6.9 g/dL (ref 6.5–8.1)

## 2016-10-03 LAB — CBC WITH DIFFERENTIAL/PLATELET
BASOS PCT: 0 %
Basophils Absolute: 0.1 10*3/uL (ref 0–0.1)
EOS ABS: 0 10*3/uL (ref 0–0.7)
EOS PCT: 0 %
HCT: 41.5 % (ref 35.0–47.0)
Hemoglobin: 14.4 g/dL (ref 12.0–16.0)
LYMPHS ABS: 0.5 10*3/uL — AB (ref 1.0–3.6)
Lymphocytes Relative: 4 %
MCH: 31 pg (ref 26.0–34.0)
MCHC: 34.7 g/dL (ref 32.0–36.0)
MCV: 89.5 fL (ref 80.0–100.0)
MONOS PCT: 6 %
Monocytes Absolute: 0.8 10*3/uL (ref 0.2–0.9)
Neutro Abs: 11.7 10*3/uL — ABNORMAL HIGH (ref 1.4–6.5)
Neutrophils Relative %: 90 %
PLATELETS: 244 10*3/uL (ref 150–440)
RBC: 4.64 MIL/uL (ref 3.80–5.20)
RDW: 12.7 % (ref 11.5–14.5)
WBC: 13 10*3/uL — ABNORMAL HIGH (ref 3.6–11.0)

## 2016-10-03 LAB — URINALYSIS, COMPLETE (UACMP) WITH MICROSCOPIC
Bilirubin Urine: NEGATIVE
Glucose, UA: 50 mg/dL — AB
Hgb urine dipstick: NEGATIVE
Ketones, ur: NEGATIVE mg/dL
Leukocytes, UA: NEGATIVE
Nitrite: NEGATIVE
Protein, ur: NEGATIVE mg/dL
SPECIFIC GRAVITY, URINE: 1.014 (ref 1.005–1.030)
pH: 7 (ref 5.0–8.0)

## 2016-10-03 LAB — GLUCOSE, CAPILLARY: Glucose-Capillary: 192 mg/dL — ABNORMAL HIGH (ref 65–99)

## 2016-10-03 MED ORDER — SODIUM CHLORIDE 0.9 % IV BOLUS (SEPSIS)
1000.0000 mL | Freq: Once | INTRAVENOUS | Status: AC
  Administered 2016-10-03: 1000 mL via INTRAVENOUS

## 2016-10-03 MED ORDER — ONDANSETRON HCL 4 MG/2ML IJ SOLN: 4.0000 mg | Freq: Four times a day (QID) | INTRAMUSCULAR | Status: DC | PRN

## 2016-10-03 MED ORDER — HYDRALAZINE HCL 20 MG/ML IJ SOLN: 10.0000 mg | INTRAMUSCULAR | Status: DC | PRN

## 2016-10-03 MED ORDER — ONDANSETRON 4 MG PO TBDP: 4.0000 mg | ORAL_TABLET | Freq: Four times a day (QID) | ORAL | Status: DC | PRN

## 2016-10-03 MED ORDER — PIPERACILLIN-TAZOBACTAM 3.375 G IVPB
3.3750 g | Freq: Three times a day (TID) | INTRAVENOUS | Status: DC
  Administered 2016-10-03 – 2016-10-06 (×8): 3.375 g via INTRAVENOUS
  Filled 2016-10-03 (×14): qty 50

## 2016-10-03 MED ORDER — DIPHENHYDRAMINE HCL 50 MG/ML IJ SOLN: 12.5000 mg | Freq: Four times a day (QID) | INTRAMUSCULAR | Status: DC | PRN

## 2016-10-03 MED ORDER — INSULIN ASPART 100 UNIT/ML ~~LOC~~ SOLN
0.0000 [IU] | Freq: Three times a day (TID) | SUBCUTANEOUS | Status: DC
  Administered 2016-10-04: 4 [IU] via SUBCUTANEOUS
  Administered 2016-10-04: 3 [IU] via SUBCUTANEOUS
  Administered 2016-10-04: 4 [IU] via SUBCUTANEOUS
  Administered 2016-10-05: 3 [IU] via SUBCUTANEOUS
  Administered 2016-10-05: 4 [IU] via SUBCUTANEOUS
  Administered 2016-10-06: 3 [IU] via SUBCUTANEOUS
  Filled 2016-10-03: qty 4
  Filled 2016-10-03: qty 3
  Filled 2016-10-03: qty 4
  Filled 2016-10-03: qty 3
  Filled 2016-10-03: qty 4
  Filled 2016-10-03: qty 3

## 2016-10-03 MED ORDER — ENOXAPARIN SODIUM 40 MG/0.4ML ~~LOC~~ SOLN
40.0000 mg | SUBCUTANEOUS | Status: DC
  Administered 2016-10-03: 40 mg via SUBCUTANEOUS
  Filled 2016-10-03: qty 0.4

## 2016-10-03 MED ORDER — MORPHINE SULFATE (PF) 4 MG/ML IV SOLN
4.0000 mg | Freq: Once | INTRAVENOUS | Status: AC
  Administered 2016-10-03: 4 mg via INTRAVENOUS
  Filled 2016-10-03: qty 1

## 2016-10-03 MED ORDER — MORPHINE SULFATE (PF) 4 MG/ML IV SOLN
4.0000 mg | INTRAVENOUS | Status: DC | PRN
  Administered 2016-10-04: 4 mg via INTRAVENOUS
  Filled 2016-10-03: qty 1

## 2016-10-03 MED ORDER — MORPHINE SULFATE (PF) 4 MG/ML IV SOLN
4.0000 mg | Freq: Once | INTRAVENOUS | Status: AC
Start: 2016-10-03 — End: 2016-10-03
  Administered 2016-10-03: 4 mg via INTRAVENOUS
  Filled 2016-10-03: qty 1

## 2016-10-03 MED ORDER — DIPHENHYDRAMINE HCL 12.5 MG/5ML PO ELIX: 12.5000 mg | ORAL_SOLUTION | Freq: Four times a day (QID) | ORAL | Status: DC | PRN

## 2016-10-03 MED ORDER — IOPAMIDOL (ISOVUE-300) INJECTION 61%
30.0000 mL | Freq: Once | INTRAVENOUS | Status: AC | PRN
  Administered 2016-10-03: 30 mL via ORAL

## 2016-10-03 MED ORDER — IOPAMIDOL (ISOVUE-300) INJECTION 61%
100.0000 mL | Freq: Once | INTRAVENOUS | Status: AC | PRN
  Administered 2016-10-03: 100 mL via INTRAVENOUS

## 2016-10-03 MED ORDER — LOSARTAN POTASSIUM 50 MG PO TABS
50.0000 mg | ORAL_TABLET | Freq: Every day | ORAL | Status: DC
  Administered 2016-10-04 – 2016-10-06 (×3): 50 mg via ORAL
  Filled 2016-10-03 (×4): qty 1

## 2016-10-03 MED ORDER — ENOXAPARIN SODIUM 40 MG/0.4ML ~~LOC~~ SOLN
40.0000 mg | Freq: Two times a day (BID) | SUBCUTANEOUS | Status: DC
  Administered 2016-10-04 – 2016-10-06 (×4): 40 mg via SUBCUTANEOUS
  Filled 2016-10-03 (×4): qty 0.4

## 2016-10-03 MED ORDER — ONDANSETRON HCL 4 MG/2ML IJ SOLN
4.0000 mg | Freq: Once | INTRAMUSCULAR | Status: AC
  Administered 2016-10-03: 4 mg via INTRAVENOUS
  Filled 2016-10-03: qty 2

## 2016-10-03 MED ORDER — PANTOPRAZOLE SODIUM 40 MG IV SOLR
40.0000 mg | Freq: Every day | INTRAVENOUS | Status: DC
  Administered 2016-10-03 – 2016-10-06 (×3): 40 mg via INTRAVENOUS
  Filled 2016-10-03 (×3): qty 40

## 2016-10-03 MED ORDER — DEXTROSE IN LACTATED RINGERS 5 % IV SOLN
INTRAVENOUS | Status: DC
  Administered 2016-10-03 (×2): via INTRAVENOUS

## 2016-10-03 NOTE — ED Notes (Signed)
Helped pt to bathroom to void.

## 2016-10-03 NOTE — ED Notes (Signed)
Report given to [facility] on [facility]

## 2016-10-03 NOTE — H&P (Signed)
Patient ID: Kara [name], female   DOB: 08/25/1950, 66 y.o.   MRN: [id number]  CC: Abdominal pain  HPI Kara [name] is a 66 y.o. female presents emergency department with a one-day history of abdominal pain. She was discharged home from the surgery services 2 days ago on oral antibiotics for diverticulitis. She reports the pain was completely gone prior to being discharged but returned last night. The pain is identical. Patient reports that he tried to put out less likely, but as the pain progressed she decided to come to the ER today. She denies any fevers, chills, nausea, vomiting, chest pain, shortness of breath, diarrhea, constipation. She was taken antibodies as prescribed. The pain is always in the left lower quadrant and does not radiate.  HPI  Past Medical History:  Diagnosis Date  . Diabetes mellitus without complication ([place])   . Hypertension     Past Surgical History:  Procedure Laterality Date  . CARPAL TUNNEL RELEASE Bilateral   . KNEE SURGERY     torn menicus    Family History  Problem Relation Age of Onset  . Diabetes Mother   . Heart disease Mother   . Cancer Mother   . Heart disease Father   . Diabetes Sister   . Heart disease Sister     Social History Social History  Substance Use Topics  . Smoking status: Never Smoker  . Smokeless tobacco: Never Used  . Alcohol use No    No Known Allergies  No current facility-administered medications for this encounter.    Current Outpatient Prescriptions  Medication Sig Dispense Refill  . amoxicillin-clavulanate (AUGMENTIN) 875-125 MG tablet Take 1 tablet by mouth every 12 (twelve) hours. 20 tablet 0  . cyclobenzaprine (FLEXERIL) 10 MG tablet Take 10 mg by mouth daily as needed.    . diclofenac (VOLTAREN) 75 MG EC tablet Take 75 mg by mouth daily.    Marland Kitchen glipiZIDE (GLUCOTROL) 5 MG tablet Take 0.5 tablets (2.5 mg total) by mouth daily before breakfast. 90 tablet 0  . losartan (COZAAR) 50 MG  tablet Take 50 mg by mouth daily.  0  . Multiple Vitamin (MULTI-VITAMINS) TABS Take 1 tablet by mouth daily.    Marland Kitchen aspirin EC 81 MG tablet Take 81 mg by mouth daily.       Review of Systems A Multi-point review of systems was asked and was negative except for the findings documented in the history of present illness  Physical Exam Blood pressure (!) 171/90, pulse 85, temperature 97.5 F (36.4 C), temperature source Oral, resp. rate (!) 26, height '5\' 3"'$  (1.6 m), weight 110.2 kg (243 lb), SpO2 97 %. CONSTITUTIONAL: No acute distress. EYES: Pupils are equal, round, and reactive to light, Sclera are non-icteric. EARS, NOSE, MOUTH AND THROAT: The oropharynx is clear. The oral mucosa is pink and moist. Hearing is intact to voice. LYMPH NODES:  Lymph nodes in the neck are normal. RESPIRATORY:  Lungs are clear. There is normal respiratory effort, with equal breath sounds bilaterally, and without pathologic use of accessory muscles. CARDIOVASCULAR: Heart is regular without murmurs, gallops, or rubs. GI: The abdomen is large, soft, tender to palpation in the left lower quadrant, and nondistended. There are no palpable masses. There is no hepatosplenomegaly. There are normal bowel sounds in all quadrants. GU: Rectal deferred.   MUSCULOSKELETAL: Normal muscle strength and tone. No cyanosis or edema.   SKIN: Turgor is good and there are no pathologic skin lesions or ulcers. NEUROLOGIC:  Motor and sensation is grossly normal. Cranial nerves are grossly intact. PSYCH:  Oriented to person, place and time. Affect is normal.  Data Reviewed Images and labs reviewed. Labs show a leukocytosis of 13, mild hyperkalemia 3.3. CT scan of the abdomen shows sigmoid diverticulitis with phlegmon but no abscess, free air, free fluid. I have personally reviewed the patient's imaging, laboratory findings and medical records.    Assessment    Sigmoid diverticulitis    Plan    66 year old female with sigmoid  diverticulitis that has failed oral antibiotic therapy at home. Discussed with the patient the indications for admission, IV antibiotics, bowel rest. Discussed again the likelihood of improvement with antibiotics however should it fail to improve that she may require urgent surgical intervention. She voices understanding and continues to state she would like to avoid surgery if possible. Plan for admission, bowel rest, IV antibiotics and fluids.     Time spent with the patient was 50 minutes, with more than 50% of the time spent in face-to-face education, counseling and care coordination.     [name], MD FACS General Surgeon 10/03/2016, 2:40 PM

## 2016-10-03 NOTE — ED Provider Notes (Signed)
[facility] Emergency Department Provider Note ____________________________________________   I have reviewed the triage vital signs and the triage nursing note.  HISTORY  Chief Complaint Abdominal Pain   Historian Patient  HPI Kara [name] is a 66 y.o. female recently admitted to the hospital on 5/11 for diverticulitis with microperforation, on IV antibiotics and discharged yesterday with pain control. She was sent home on Augmentin and did not need pain medications at that point in time. Overnight especially this morning she had significant worse and return of pain that was similar to what brought her in for diagnosed in the first place. No fevers. Pain is moderate to severe at present. Located in the left lower quadrant the same site that it was earlier. Mild nausea without vomiting. No changes in her stools.  Movement and pressing on the area makes it feel worse.    Past Medical History:  Diagnosis Date  . Diabetes mellitus without complication ([place])   . Hypertension     Patient Active Problem List   Diagnosis Date Noted  . Diverticulitis large intestine 09/29/2016  . Acute non-recurrent maxillary sinusitis 05/21/2016  . Acute hip pain, left 02/23/2016  . Chronic low back pain without sciatica 11/14/2015  . Cough 07/30/2015  . Nocturnal leg cramps 02/20/2015  . Diabetes mellitus type 2 in obese ([place]) 11/12/2014  . BP (high blood pressure) 11/12/2014    Past Surgical History:  Procedure Laterality Date  . CARPAL TUNNEL RELEASE Bilateral   . KNEE SURGERY     torn menicus    Prior to Admission medications   Medication Sig Start Date End Date Taking? Authorizing Provider  amoxicillin-clavulanate (AUGMENTIN) 875-125 MG tablet Take 1 tablet by mouth every 12 (twelve) hours. 10/01/16   [name], MD  aspirin EC 81 MG tablet Take 81 mg by mouth daily.    [provider]  cyclobenzaprine (FLEXERIL) 10 MG tablet Take 10 mg by  mouth daily as needed. 07/05/16   [provider]  diclofenac (VOLTAREN) 75 MG EC tablet Take 75 mg by mouth daily. 07/05/16   [provider]  glipiZIDE (GLUCOTROL) 5 MG tablet Take 0.5 tablets (2.5 mg total) by mouth daily before breakfast. 02/04/16   Roselee Nova, MD  losartan (COZAAR) 50 MG tablet Take 50 mg by mouth daily. 07/21/16   [provider]  Multiple Vitamin (MULTI-VITAMINS) TABS Take 1 tablet by mouth daily.    [provider]    No Known Allergies  Family History  Problem Relation Age of Onset  . Diabetes Mother   . Heart disease Mother   . Cancer Mother   . Heart disease Father   . Diabetes Sister   . Heart disease Sister     Social History Social History  Substance Use Topics  . Smoking status: Never Smoker  . Smokeless tobacco: Never Used  . Alcohol use No    Review of Systems  Constitutional: Negative for fever. Eyes: Negative for visual changes. ENT: Negative for sore throat. Cardiovascular: Negative for chest pain. Respiratory: Negative for shortness of breath. Gastrointestinal: Positive for abdominal pain as per history of present illness.. Genitourinary: Negative for dysuria. Musculoskeletal: Negative for back pain. Skin: Negative for rash. Neurological: Negative for headache. 10 point Review of Systems otherwise negative ____________________________________________   PHYSICAL EXAM:  VITAL SIGNS: ED Triage Vitals [10/03/16 0924]  Enc Vitals Group     BP (!) 163/101     Pulse Rate 99  Resp 16     Temp 98.3 F (36.8 C)     Temp Source Oral     SpO2 95 %     Weight 243 lb (110.2 kg)     Height '5\' 3"'$  (1.6 m)     Head Circumference      Peak Flow      Pain Score      Pain Loc      Pain Edu?      Excl. in [place]?      Constitutional: Alert and oriented. Holding her abdomen and clearly in pain. HEENT   Head: Normocephalic and atraumatic.      Eyes: Conjunctivae are normal. PERRL. Normal  extraocular movements.      Ears:         Nose: No congestion/rhinnorhea.   Mouth/Throat: Mucous membranes are moist.   Neck: No stridor. Cardiovascular/Chest: Normal rate, regular rhythm.  No murmurs, rubs, or gallops. Respiratory: Normal respiratory effort without tachypnea nor retractions. Breath sounds are clear and equal bilaterally. No wheezes/rales/rhonchi. Gastrointestinal: Soft. No distention, no guarding, no rebound. Obese. Moderate tenderness in the suprapubic and left lower quadrant.  Genitourinary/rectal:Deferred Musculoskeletal: Nontender with normal range of motion in all extremities. No joint effusions.  No lower extremity tenderness.  No edema. Neurologic:  Normal speech and language. No gross or focal neurologic deficits are appreciated. Skin:  Skin is warm, dry and intact. No rash noted. Psychiatric: Mood and affect are normal. Speech and behavior are normal. Patient exhibits appropriate insight and judgment.   ____________________________________________  LABS (pertinent positives/negatives)  Labs Reviewed  URINALYSIS, COMPLETE (UACMP) WITH MICROSCOPIC - Abnormal; Notable for the following:       Result Value   Color, Urine YELLOW (*)    APPearance CLEAR (*)    Glucose, UA 50 (*)    Bacteria, UA RARE (*)    Squamous Epithelial / LPF 0-5 (*)    All other components within normal limits  COMPREHENSIVE METABOLIC PANEL - Abnormal; Notable for the following:    Potassium 3.3 (*)    Glucose, Bld 223 (*)    Calcium 8.6 (*)    All other components within normal limits  CBC WITH DIFFERENTIAL/PLATELET - Abnormal; Notable for the following:    WBC 13.0 (*)    Neutro Abs 11.7 (*)    Lymphs Abs 0.5 (*)    All other components within normal limits    ____________________________________________    EKG I, Lisa Roca, MD, the attending physician have personally viewed and interpreted all  ECGs.  None ____________________________________________  RADIOLOGY All Xrays were viewed by me. Imaging interpreted by Radiologist.  CT abd pel:  IMPRESSION: Sigmoid diverticulitis with adjacent fluid/gas along the left pelvic sidewall, reflecting localized perforation.  This has mildly progressed from the recent prior but does not reflect a drainable abscess at the current time.  No ascites or free air.  Additional ancillary findings as above. __________________________________________  PROCEDURES  Procedure(s) performed: None  Critical Care performed: None  ____________________________________________   ED COURSE / ASSESSMENT AND PLAN  Pertinent labs & imaging results that were available during my care of the patient were reviewed by me and considered in my medical decision making (see chart for details).    Mrs. Glennon Mac is here with worsening pain despite recent discharge home on Augmentin for sigmoid diverticulitis.  Elevated white blood cell count today and worsening pain, repeated dose of narcotic medicine, I discussed with her obtaining CT for reevaluation.  CT shows no  new abscess, but there is progression of the diverticulitis.  I spoke with Dr. Adonis Huguenin who comes to the patient for admission.    CONSULTATIONS:  Dr. Adonis Huguenin, gen surgery for admission.   Patient / Family / Caregiver informed of clinical course, medical decision-making process, and agree with plan.   ___________________________________________   FINAL CLINICAL IMPRESSION(S) / ED DIAGNOSES   Final diagnoses:  Diverticulitis of large intestine with perforation without abscess, unspecified bleeding status              Note: This dictation was prepared with Dragon dictation. Any transcriptional errors that result from this process are unintentional    Lisa Roca, MD 10/03/16 1339

## 2016-10-03 NOTE — ED Notes (Signed)
Pt assisted to toilet; urine sample not obtained. Hat placed for next urine episode

## 2016-10-03 NOTE — ED Notes (Addendum)
Assisted pt to toilet 

## 2016-10-03 NOTE — Progress Notes (Signed)
Anticoagulation monitoring(Lovenox):  66yo  F ordered Lovenox 40 mg Q24h  Filed Weights   10/03/16 0924 10/03/16 1453  Weight: 243 lb (110.2 kg) 237 lb 14.4 oz (107.9 kg)   BMI 42.2   Lab Results  Component Value Date   CREATININE 0.70 10/03/2016   CREATININE 0.72 10/01/2016   CREATININE 0.68 09/30/2016   Estimated Creatinine Clearance: 82.6 mL/min (by C-G formula based on SCr of 0.7 mg/dL). Hemoglobin & Hematocrit     Component Value Date/Time   HGB 14.4 10/03/2016 0928   HGB 13.9 05/30/2013 0520   HCT 41.5 10/03/2016 0928   HCT 40.8 05/30/2013 0520     Per Protocol for Patient with estCrcl > 30 ml/min and BMI > 40, will transition to Lovenox 40 mg Q12h.     Chinita Greenland PharmD Clinical Pharmacist 10/03/2016

## 2016-10-03 NOTE — ED Triage Notes (Signed)
Pt came to ED via EMS from home. Pt d/c Friday from hospital, admitted for diverticulitis. Reports felt fine when discharged but reports last night started again with left lower abdominal pain, several episodes of diarrhea.

## 2016-10-04 DIAGNOSIS — K5792 Diverticulitis of intestine, part unspecified, without perforation or abscess without bleeding: Secondary | ICD-10-CM

## 2016-10-04 LAB — BASIC METABOLIC PANEL
Anion gap: 7 (ref 5–15)
BUN: 7 mg/dL (ref 6–20)
CALCIUM: 8.1 mg/dL — AB (ref 8.9–10.3)
CO2: 26 mmol/L (ref 22–32)
CREATININE: 0.5 mg/dL (ref 0.44–1.00)
Chloride: 106 mmol/L (ref 101–111)
GFR calc Af Amer: >60 mL/min (ref >60)
GFR calc non Af Amer: >60 mL/min (ref >60)
GLUCOSE: 209 mg/dL — AB (ref 65–99)
Potassium: 3.5 mmol/L (ref 3.5–5.1)
Sodium: 139 mmol/L (ref 135–145)

## 2016-10-04 LAB — GLUCOSE, CAPILLARY
Glucose-Capillary: 191 mg/dL — ABNORMAL HIGH (ref 65–99)
Glucose-Capillary: 185 mg/dL — ABNORMAL HIGH (ref 65–99)
Glucose-Capillary: 123 mg/dL — ABNORMAL HIGH (ref 65–99)
Glucose-Capillary: 140 mg/dL — ABNORMAL HIGH (ref 65–99)

## 2016-10-04 LAB — CBC
HEMATOCRIT: 38.1 % (ref 35.0–47.0)
Hemoglobin: 13.3 g/dL (ref 12.0–16.0)
MCH: 31.6 pg (ref 26.0–34.0)
MCHC: 34.9 g/dL (ref 32.0–36.0)
MCV: 90.6 fL (ref 80.0–100.0)
Platelets: 203 10*3/uL (ref 150–440)
RBC: 4.21 MIL/uL (ref 3.80–5.20)
RDW: 12.6 % (ref 11.5–14.5)
WBC: 8.5 10*3/uL (ref 3.6–11.0)

## 2016-10-04 MED ORDER — POTASSIUM CHLORIDE IN NACL 20-0.9 MEQ/L-% IV SOLN
INTRAVENOUS | Status: DC
  Administered 2016-10-04 – 2016-10-06 (×6): via INTRAVENOUS
  Filled 2016-10-04 (×11): qty 1000

## 2016-10-04 MED ORDER — KCL IN DEXTROSE-NACL 20-5-0.45 MEQ/L-%-% IV SOLN: INTRAVENOUS | Status: DC

## 2016-10-04 NOTE — Progress Notes (Signed)
Inpatient Diabetes Program Recommendations  AACE/ADA: New Consensus Statement on Inpatient Glycemic Control (2015)  Target Ranges:  Prepandial:   less than 140 mg/dL      Peak postprandial:   less than 180 mg/dL (1-2 hours)      Critically ill patients:  140 - 180 mg/dL   Lab Results  Component Value Date   GLUCAP 185 (H) 10/04/2016   HGBA1C 7.2 (H) 09/30/2016    Review of Glycemic Control:  Results for NOLIE, BIGNELL (MRN [id number]) as of 10/04/2016 12:27  Ref. Range 10/01/2016 11:34 10/01/2016 16:35 10/03/2016 21:14 10/04/2016 07:39 10/04/2016 11:24  Glucose-Capillary Latest Ref Range: 65 - 99 mg/dL 219 (H) 186 (H) 192 (H) 191 (H) 185 (H)   Diabetes history: Type 2 diabetes Outpatient Diabetes medications: Glucotrol 2.5 mg daily with breakfast Current orders for Inpatient glycemic control:  Novolog resistant tid with meals  Inpatient Diabetes Program Recommendations:    Note blood sugars slightly greater than goal.  If CBG's remain greater than 180 mg/dL, consider adding Lantus 15 units daily.   Thanks, Adah Perl, RN, BC-ADM Inpatient Diabetes Coordinator Pager [phone] (8a-5p)

## 2016-10-04 NOTE — Progress Notes (Signed)
CC: Lower abdominal pain Subjective: This patient readmitted with diverticulitis of an acute nature. She's had no sign of perforation. She feels slightly better today but still with lower abdominal pain no nausea vomiting no fevers or chills wants to eat a heart healthy diet.  Objective: Vital signs in last 24 hours: Temp:  [97.5 F (36.4 C)-98.4 F (36.9 C)] 98.4 F (36.9 C) (05/14 0432) Pulse Rate:  [75-99] 78 (05/14 0432) Resp:  [16-30] 20 (05/14 0432) BP: (143-180)/(60-112) 156/67 (05/14 0432) SpO2:  [94 %-98 %] 94 % (05/14 0432) Weight:  [237 lb 14.4 oz (107.9 kg)-243 lb (110.2 kg)] 237 lb 14.4 oz (107.9 kg) (05/13 1453)    Intake/Output from previous day: 05/13 0701 - 05/14 0700 In: 2737 [P.O.:237; I.V.:1500; IV Piggyback:1000] Out: 2700 [Urine:2700] Intake/Output this shift: Total I/O In: -  Out: 900 [Urine:900]  Physical exam:  Afebrile no acute distress morbidly obese Soft abdomen tenderness in both lower quadrants with minimal guarding no rebound or percussion tenderness Calves are nontender No icterus no jaundice  Lab Results: CBC   Recent Labs  10/03/16 0928 10/04/16 0331  WBC 13.0* 8.5  HGB 14.4 13.3  HCT 41.5 38.1  PLT 244 203   BMET  Recent Labs  10/03/16 0928 10/04/16 0331  NA 139 139  K 3.3* 3.5  CL 109 106  CO2 22 26  GLUCOSE 223* 209*  BUN 16 7  CREATININE 0.70 0.50  CALCIUM 8.6* 8.1*   PT/INR No results for input(s): LABPROT, INR in the last 72 hours. ABG No results for input(s): PHART, HCO3 in the last 72 hours.  Invalid input(s): PCO2, PO2  Studies/Results: Ct Abdomen Pelvis W Contrast  Result Date: 10/03/2016 CLINICAL DATA:  Left lower quadrant pain, diarrhea, recently diagnosed with diverticulitis EXAM: CT ABDOMEN AND PELVIS WITH CONTRAST TECHNIQUE: Multidetector CT imaging of the abdomen and pelvis was performed using the standard protocol following bolus administration of intravenous contrast. CONTRAST:  162m ISOVUE-300  IOPAMIDOL (ISOVUE-300) INJECTION 61% COMPARISON:  CT abdomen pelvis dated 09/29/2016 FINDINGS: Lower chest: Lung bases are clear. Hepatobiliary: Liver is within normal limits. Layering gallstones (series 2/ image 26), without associated inflammatory changes. Pancreas: Within normal limits. Spleen: Within normal limits. Adrenals/Urinary Tract: Adrenal glands within normal limits. Kidneys are within normal limits.  No hydronephrosis. Bladder is within normal limits. Stomach/Bowel: Stomach is within normal limits. No evidence of bowel obstruction. Normal appendix (series 2/ image 60). Sigmoid diverticulitis. Adjacent fluid/gas collection measuring 5.3 x 2.7 cm along the left pelvic side wall adjacent to the left ovary (series 2/ image 64), previously approximately 2.8 x 1.4 cm. This is compatible with localized perforation. The collection is predominantly gas and does not reflect a drainable abscess at the current time. Vascular/Lymphatic: No evidence of abdominal aortic aneurysm. Atherosclerotic calcifications of the abdominal aorta and branch vessels. No suspicious abdominopelvic lymphadenopathy. Reproductive: Uterus is unremarkable. Right ovary is unremarkable. Left ovary is adjacent to the fluid/gas collection. Other: No abdominopelvic ascites. No free air. Musculoskeletal: Degenerative changes of the visualized thoracolumbar spine. Grade 1 spondylolisthesis at L4-5. IMPRESSION: Sigmoid diverticulitis with adjacent fluid/gas along the left pelvic sidewall, reflecting localized perforation. This has mildly progressed from the recent prior but does not reflect a drainable abscess at the current time. No ascites or free air. Additional ancillary findings as above. Electronically Signed   By: SJulian HyM.D.   On: 10/03/2016 13:24    Anti-infectives: Anti-infectives    Start     Dose/Rate Route Frequency Ordered Stop  10/03/16 1500  piperacillin-tazobactam (ZOSYN) IVPB 3.375 g     3.375 g 12.5 mL/hr  over 240 Minutes Intravenous Every 8 hours 10/03/16 1452        Assessment/Plan:  White blood cell count has normalized. Continue IV antibiotics as this patient has rebounded to the emergency room once after being sent home on oral antibiotics. We'll continue IVs at this point and will continue a low residue diet and not advance her to the requested "heart healthy diet".  Florene Glen, MD, FACS  10/04/2016

## 2016-10-05 LAB — GLUCOSE, CAPILLARY
GLUCOSE-CAPILLARY: 150 mg/dL — AB (ref 65–99)
GLUCOSE-CAPILLARY: 109 mg/dL — AB (ref 65–99)
GLUCOSE-CAPILLARY: 143 mg/dL — AB (ref 65–99)
Glucose-Capillary: 154 mg/dL — ABNORMAL HIGH (ref 65–99)
Glucose-Capillary: 34 mg/dL — CL (ref 65–99)

## 2016-10-05 NOTE — Plan of Care (Signed)
Problem: Nutrition: Goal: Adequate nutrition will be maintained Outcome: Progressing Patient's diet changed from clear liquid to full liquid and difference explained to patient

## 2016-10-05 NOTE — Progress Notes (Signed)
CC: Acute diverticulitis Subjective: Patient feels much better today and is tolerating clear liquid diet. She feels as if she is going to have a bowel movement no nausea vomiting no fevers or chills  Objective: Vital signs in last 24 hours: Temp:  [97.7 F (36.5 C)-98.2 F (36.8 C)] 97.7 F (36.5 C) (05/15 0527) Pulse Rate:  [73-79] 75 (05/15 0527) Resp:  [16-20] 16 (05/15 0527) BP: (150-185)/(59-79) 150/59 (05/15 0527) SpO2:  [94 %-99 %] 94 % (05/15 0527) Last BM Date: 10/02/16  Intake/Output from previous day: 05/14 0701 - 05/15 0700 In: 4729.6 [P.O.:1990; I.V.:2639.6; IV Piggyback:100] Out: 3500 [Urine:3500] Intake/Output this shift: No intake/output data recorded.  Physical exam:  Vital signs reviewed and stable afebrile abdomen is soft and much less tender nondistended morbidly obese nontender calves no icterus no jaundice  Lab Results: CBC   Recent Labs  10/03/16 0928 10/04/16 0331  WBC 13.0* 8.5  HGB 14.4 13.3  HCT 41.5 38.1  PLT 244 203   BMET  Recent Labs  10/03/16 0928 10/04/16 0331  NA 139 139  K 3.3* 3.5  CL 109 106  CO2 22 26  GLUCOSE 223* 209*  BUN 16 7  CREATININE 0.70 0.50  CALCIUM 8.6* 8.1*   PT/INR No results for input(s): LABPROT, INR in the last 72 hours. ABG No results for input(s): PHART, HCO3 in the last 72 hours.  Invalid input(s): PCO2, PO2  Studies/Results: Ct Abdomen Pelvis W Contrast  Result Date: 10/03/2016 CLINICAL DATA:  Left lower quadrant pain, diarrhea, recently diagnosed with diverticulitis EXAM: CT ABDOMEN AND PELVIS WITH CONTRAST TECHNIQUE: Multidetector CT imaging of the abdomen and pelvis was performed using the standard protocol following bolus administration of intravenous contrast. CONTRAST:  148m ISOVUE-300 IOPAMIDOL (ISOVUE-300) INJECTION 61% COMPARISON:  CT abdomen pelvis dated 09/29/2016 FINDINGS: Lower chest: Lung bases are clear. Hepatobiliary: Liver is within normal limits. Layering gallstones (series 2/  image 26), without associated inflammatory changes. Pancreas: Within normal limits. Spleen: Within normal limits. Adrenals/Urinary Tract: Adrenal glands within normal limits. Kidneys are within normal limits.  No hydronephrosis. Bladder is within normal limits. Stomach/Bowel: Stomach is within normal limits. No evidence of bowel obstruction. Normal appendix (series 2/ image 60). Sigmoid diverticulitis. Adjacent fluid/gas collection measuring 5.3 x 2.7 cm along the left pelvic side wall adjacent to the left ovary (series 2/ image 64), previously approximately 2.8 x 1.4 cm. This is compatible with localized perforation. The collection is predominantly gas and does not reflect a drainable abscess at the current time. Vascular/Lymphatic: No evidence of abdominal aortic aneurysm. Atherosclerotic calcifications of the abdominal aorta and branch vessels. No suspicious abdominopelvic lymphadenopathy. Reproductive: Uterus is unremarkable. Right ovary is unremarkable. Left ovary is adjacent to the fluid/gas collection. Other: No abdominopelvic ascites. No free air. Musculoskeletal: Degenerative changes of the visualized thoracolumbar spine. Grade 1 spondylolisthesis at L4-5. IMPRESSION: Sigmoid diverticulitis with adjacent fluid/gas along the left pelvic sidewall, reflecting localized perforation. This has mildly progressed from the recent prior but does not reflect a drainable abscess at the current time. No ascites or free air. Additional ancillary findings as above. Electronically Signed   By: SJulian HyM.D.   On: 10/03/2016 13:24    Anti-infectives: Anti-infectives    Start     Dose/Rate Route Frequency Ordered Stop   10/03/16 1500  piperacillin-tazobactam (ZOSYN) IVPB 3.375 g     3.375 g 12.5 mL/hr over 240 Minutes Intravenous Every 8 hours 10/03/16 1452        Assessment/Plan:  Patient is  doing much better and improving overall Will recheck white blood cell count tomorrow and likely discharge  tomorrow on oral antibiotics but continue IVs for now as she has improved but because she bounced back to the emergency room we need to be more conservative with her discharge plans.  Florene Glen, MD, FACS  10/05/2016

## 2016-10-06 LAB — CBC WITH DIFFERENTIAL/PLATELET
BASOS ABS: 0 10*3/uL (ref 0–0.1)
BASOS PCT: 0 %
Eosinophils Absolute: 0.1 10*3/uL (ref 0–0.7)
Eosinophils Relative: 1 %
HCT: 39.8 % (ref 35.0–47.0)
HEMOGLOBIN: 13.9 g/dL (ref 12.0–16.0)
Lymphocytes Relative: 14 %
Lymphs Abs: 1 10*3/uL (ref 1.0–3.6)
MCH: 31.4 pg (ref 26.0–34.0)
MCHC: 35 g/dL (ref 32.0–36.0)
MCV: 89.6 fL (ref 80.0–100.0)
Monocytes Absolute: 0.8 10*3/uL (ref 0.2–0.9)
Monocytes Relative: 12 %
NEUTROS ABS: 5 10*3/uL (ref 1.4–6.5)
NEUTROS PCT: 73 %
Platelets: 263 10*3/uL (ref 150–440)
RBC: 4.44 MIL/uL (ref 3.80–5.20)
RDW: 12.6 % (ref 11.5–14.5)
WBC: 6.8 10*3/uL (ref 3.6–11.0)

## 2016-10-06 LAB — GLUCOSE, CAPILLARY: GLUCOSE-CAPILLARY: 139 mg/dL — AB (ref 65–99)

## 2016-10-06 MED ORDER — HYDROCODONE-ACETAMINOPHEN 5-300 MG PO TABS: 1.0000 | ORAL_TABLET | ORAL | 0 refills | Status: DC | PRN

## 2016-10-06 MED ORDER — AMOXICILLIN-POT CLAVULANATE 875-125 MG PO TABS: 1.0000 | ORAL_TABLET | Freq: Two times a day (BID) | ORAL | 1 refills | Status: DC

## 2016-10-06 NOTE — Progress Notes (Signed)
CC: *Acute diverticulitis Subjective: *This a patient with acute diverticulitis his pain is completely resolved at this point she is tolerating a full liquid diet and is ready for discharge she has no nausea vomiting fevers or chills. Objective: Vital signs in last 24 hours: Temp:  [97.3 F (36.3 C)-98 F (36.7 C)] 98 F (36.7 C) (05/16 0514) Pulse Rate:  [69-87] 69 (05/16 0514) Resp:  [18] 18 (05/16 0514) BP: (147-154)/(69-74) 154/74 (05/16 0514) SpO2:  [95 %-100 %] 95 % (05/16 0514) Last BM Date: 10/05/16  Intake/Output from previous day: 05/15 0701 - 05/16 0700 In: 3769 [P.O.:1960; I.V.:1709; IV Piggyback:100] Out: -  Intake/Output this shift: No intake/output data recorded.  Physical exam:  Morbidly obese female patient in no acute distress vital signs are stable afebrile abdomen is soft nontender today no peritoneal signs nontender calves O icterus no jaundice  Lab Results: CBC   Recent Labs  10/04/16 0331 10/06/16 0528  WBC 8.5 6.8  HGB 13.3 13.9  HCT 38.1 39.8  PLT 203 263   BMET  Recent Labs  10/03/16 0928 10/04/16 0331  NA 139 139  K 3.3* 3.5  CL 109 106  CO2 22 26  GLUCOSE 223* 209*  BUN 16 7  CREATININE 0.70 0.50  CALCIUM 8.6* 8.1*   PT/INR No results for input(s): LABPROT, INR in the last 72 hours. ABG No results for input(s): PHART, HCO3 in the last 72 hours.  Invalid input(s): PCO2, PO2  Studies/Results: No results found.  Anti-infectives: Anti-infectives    Start     Dose/Rate Route Frequency Ordered Stop   10/06/16 0000  amoxicillin-clavulanate (AUGMENTIN) 875-125 MG tablet     1 tablet Oral 2 times daily 10/06/16 0800     10/03/16 1500  piperacillin-tazobactam (ZOSYN) IVPB 3.375 g     3.375 g 12.5 mL/hr over 240 Minutes Intravenous Every 8 hours 10/03/16 1452        Assessment/Plan:  This a patient with acute diverticulitis which is resolving she is much improved over admission. The plan is for discharge today on a soft diet  with oral antibiotics and oral analgesics as needed she has not had any pain recently. She will follow-up in our office in 10 days. She does mention that she is traveling to [place] and may need to delay that until afterwards I gave her refills on her Augmentin and asked her to stay on until seen in the office.  Florene Glen, MD, FACS  10/06/2016

## 2016-10-06 NOTE — Discharge Summary (Signed)
Physician Discharge Summary  Patient ID: Netty Sullivant MRN: [id number] DOB/AGE: 1951/02/16 66 y.o.  Admit date: 10/03/2016 Discharge date: 10/06/2016   Discharge Diagnoses:  Active Problems:   Diverticulitis   Procedures:none  Hospital Course: This a patient with acute diverticulitis who was readmitted to the hospital after failing outpatient management. Today her pain is completely gone and she shows no sign of sepsis at this point. She is discharged to restart her Augmentin at home and stay on that Augmentin until being seen in the office. I asked her to come back to the office in 10 days but she states she is going on a trip to [place] and can be seen after that I reminded her to stay on the Augmentin and gave her refills of that. She understands return to our emergency room or any emergency room should she worsen at any time.  Consults: none  Disposition: 01-Home or Self Care   Allergies as of 10/06/2016      Reactions   Latex       Medication List    TAKE these medications   amoxicillin-clavulanate 875-125 MG tablet Commonly known as:  AUGMENTIN Take 1 tablet by mouth every 12 (twelve) hours. What changed:  Another medication with the same name was added. Make sure you understand how and when to take each.   amoxicillin-clavulanate 875-125 MG tablet Commonly known as:  AUGMENTIN Take 1 tablet by mouth 2 (two) times daily. What changed:  You were already taking a medication with the same name, and this prescription was added. Make sure you understand how and when to take each.   aspirin EC 81 MG tablet Take 81 mg by mouth daily.   cyclobenzaprine 10 MG tablet Commonly known as:  FLEXERIL Take 10 mg by mouth daily as needed.   diclofenac 75 MG EC tablet Commonly known as:  VOLTAREN Take 75 mg by mouth daily.   glipiZIDE 5 MG tablet Commonly known as:  GLUCOTROL Take 0.5 tablets (2.5 mg total) by mouth daily before breakfast.    Hydrocodone-Acetaminophen 5-300 MG Tabs Commonly known as:  VICODIN Take 1 tablet by mouth every 4 (four) hours as needed.   losartan 50 MG tablet Commonly known as:  COZAAR Take 50 mg by mouth daily.   MULTI-VITAMINS Tabs Take 1 tablet by mouth daily.        Florene Glen, MD, FACS

## 2016-10-06 NOTE — Progress Notes (Signed)
IV was removed. Discharge instructions, follow-up appointments, and prescriptions were provided to the pt. The pt was taken downstairs via wheelchair by volunteer services.

## 2016-10-06 NOTE — Discharge Instructions (Signed)
Maintain a soft diet Resume all home medications Continue on Augmentin until until being seen in the office. Follow-up with [facility] in 10 days or when convenient surrounding trip to [place].   Diverticulitis Diverticulitis is when small pockets in your large intestine (colon) get infected or swollen. This causes stomach pain and watery poop (diarrhea). These pouches are called diverticula. They form in people who have a condition called diverticulosis. Follow these instructions at home: Medicines   Take over-the-counter and prescription medicines only as told by your doctor. These include:  Antibiotics.  Pain medicines.  Fiber pills.  Probiotics.  Stool softeners.  Do not drive or use heavy machinery while taking prescription pain medicine.  If you were prescribed an antibiotic, take it as told. Do not stop taking it even if you feel better. General instructions   Follow a diet as told by your doctor.  When you feel better, your doctor may tell you to change your diet. You may need to eat a lot of fiber. Fiber makes it easier to poop (have bowel movements). Healthy foods with fiber include:  Berries.  Beans.  Lentils.  Green vegetables.  Exercise 3 or more times a week. Aim for 30 minutes each time. Exercise enough to sweat and make your heart beat faster.  Keep all follow-up visits as told. This is important. You may need to have an exam of the large intestine. This is called a colonoscopy. Contact a doctor if:  Your pain does not get better.  You have a hard time eating or drinking.  You are not pooping like normal. Get help right away if:  Your pain gets worse.  Your problems do not get better.  Your problems get worse very fast.  You have a fever.  You throw up (vomit) more than one time.  You have poop that is:  Bloody.  Black.  Tarry. Summary  Diverticulitis is when small pockets in your large intestine (colon) get  infected or swollen.  Take medicines only as told by your doctor.  Follow a diet as told by your doctor. This information is not intended to replace advice given to you by your health care provider. Make sure you discuss any questions you have with your health care provider. Document Released: 10/27/2007 Document Revised: 05/27/2016 Document Reviewed: 05/27/2016 Elsevier Interactive Patient Education  2017 [employer].

## 2016-10-13 ENCOUNTER — Ambulatory Visit: Payer: 59 | Admitting: Surgery

## 2016-10-14 DIAGNOSIS — K5792 Diverticulitis of intestine, part unspecified, without perforation or abscess without bleeding: Secondary | ICD-10-CM | POA: Diagnosis not present

## 2016-10-14 DIAGNOSIS — M5136 Other intervertebral disc degeneration, lumbar region: Secondary | ICD-10-CM | POA: Diagnosis not present

## 2016-10-14 DIAGNOSIS — I10 Essential (primary) hypertension: Secondary | ICD-10-CM | POA: Diagnosis not present

## 2016-10-14 DIAGNOSIS — M79661 Pain in right lower leg: Secondary | ICD-10-CM | POA: Diagnosis not present

## 2016-10-14 DIAGNOSIS — E119 Type 2 diabetes mellitus without complications: Secondary | ICD-10-CM | POA: Diagnosis not present

## 2016-10-22 HISTORY — PX: IVC FILTER INSERTION: CATH118245

## 2016-10-26 ENCOUNTER — Other Ambulatory Visit: Payer: Self-pay

## 2016-10-27 ENCOUNTER — Encounter: Payer: Self-pay | Admitting: Surgery

## 2016-10-27 ENCOUNTER — Ambulatory Visit (INDEPENDENT_AMBULATORY_CARE_PROVIDER_SITE_OTHER): Payer: Managed Care, Other (non HMO) | Admitting: Surgery

## 2016-10-27 VITALS — BP 132/78 | HR 80 | Temp 97.8°F | Ht 63.0 in | Wt 236.0 lb

## 2016-10-27 DIAGNOSIS — K5792 Diverticulitis of intestine, part unspecified, without perforation or abscess without bleeding: Secondary | ICD-10-CM

## 2016-10-27 NOTE — Progress Notes (Signed)
Outpatient Surgical Follow Up  10/27/2016  Kara [name] is an 66 y.o. female.   CC: Acute diverticulitis  HPI: This patient recently hospitalized with acute diverticulitis. She was followed by Dr. Adonis Huguenin and by Dr. Dahlia Byes.  She has no abdominal pain no nausea vomiting fevers or chills no diarrhea. She has finished her antibiotics. She states that her symptoms are completely resolved. She has never had a colonoscopy. Past Medical History:  Diagnosis Date  . Diabetes mellitus without complication ([place])   . Hypertension     Past Surgical History:  Procedure Laterality Date  . CARPAL TUNNEL RELEASE Bilateral   . KNEE SURGERY     torn menicus    Family History  Problem Relation Age of Onset  . Diabetes Mother   . Heart disease Mother   . Cancer Mother   . Heart disease Father   . Diabetes Sister   . Heart disease Sister     Social History:  reports that she has never smoked. She has never used smokeless tobacco. She reports that she does not drink alcohol or use drugs.  Allergies:  Allergies  Allergen Reactions  . Latex     Medications reviewed.   Review of Systems:   Review of Systems  Constitutional: Negative.   HENT: Negative.   Eyes: Negative.   Respiratory: Negative.   Cardiovascular: Negative.   Gastrointestinal: Negative.   Genitourinary: Negative.   Musculoskeletal: Negative.   Skin: Negative.   Neurological: Negative.   Endo/Heme/Allergies: Negative.   Psychiatric/Behavioral: Negative.      Physical Exam:  There were no vitals taken for this visit.  Physical Exam  Constitutional: She is oriented to person, place, and time. No distress.  Morbidly obese in no acute distress  HENT:  Head: Normocephalic and atraumatic.  Eyes: Pupils are equal, round, and reactive to light. Right eye exhibits no discharge. Left eye exhibits no discharge. No scleral icterus.  Pulmonary/Chest: Effort normal. No respiratory distress.  Abdominal: Soft. She  exhibits no distension. There is no tenderness. There is no rebound and no guarding.  Musculoskeletal: She exhibits no edema or tenderness.  Neurological: She is alert and oriented to person, place, and time.  Skin: Skin is warm and dry. No rash noted. She is not diaphoretic. No erythema.  Vitals reviewed.     No results found for this or any previous visit (from the past 48 hour(s)). No results found.  Assessment/Plan:  Acute diverticulitis which symptoms of completely resolved. She has been off her antibiotics for several days. She needs to have a colonoscopy at some point. I will schedule an outpatient consultation with GI. I will see her back in 2-3 weeks. I discussed with her the need to go back on oral antibiotics and she has a prescription for that should she need it and that she should call us if she needs to go back on the antibiotics at any point.  Florene Glen, MD, FACS

## 2016-10-27 NOTE — Patient Instructions (Addendum)
We will send a referral to [facility] so they could schedule a Colonoscopy for you in about 6 weeks.  We will see you back in 2 weeks to make sure that you are doing well.

## 2016-10-31 DIAGNOSIS — Z91040 Latex allergy status: Secondary | ICD-10-CM

## 2016-10-31 DIAGNOSIS — I10 Essential (primary) hypertension: Secondary | ICD-10-CM | POA: Diagnosis present

## 2016-10-31 DIAGNOSIS — Z7982 Long term (current) use of aspirin: Secondary | ICD-10-CM

## 2016-10-31 DIAGNOSIS — G8929 Other chronic pain: Secondary | ICD-10-CM | POA: Diagnosis not present

## 2016-10-31 DIAGNOSIS — E119 Type 2 diabetes mellitus without complications: Secondary | ICD-10-CM | POA: Diagnosis not present

## 2016-10-31 DIAGNOSIS — Z79899 Other long term (current) drug therapy: Secondary | ICD-10-CM | POA: Diagnosis not present

## 2016-10-31 DIAGNOSIS — Z6841 Body Mass Index (BMI) 40.0 and over, adult: Secondary | ICD-10-CM | POA: Diagnosis not present

## 2016-10-31 DIAGNOSIS — K5720 Diverticulitis of large intestine with perforation and abscess without bleeding: Secondary | ICD-10-CM | POA: Diagnosis not present

## 2016-10-31 DIAGNOSIS — R1032 Left lower quadrant pain: Secondary | ICD-10-CM | POA: Diagnosis not present

## 2016-10-31 DIAGNOSIS — M549 Dorsalgia, unspecified: Secondary | ICD-10-CM | POA: Diagnosis present

## 2016-10-31 DIAGNOSIS — K567 Ileus, unspecified: Secondary | ICD-10-CM | POA: Diagnosis not present

## 2016-10-31 DIAGNOSIS — N390 Urinary tract infection, site not specified: Secondary | ICD-10-CM | POA: Diagnosis not present

## 2016-10-31 DIAGNOSIS — M1990 Unspecified osteoarthritis, unspecified site: Secondary | ICD-10-CM | POA: Diagnosis present

## 2016-10-31 DIAGNOSIS — M5136 Other intervertebral disc degeneration, lumbar region: Secondary | ICD-10-CM | POA: Diagnosis not present

## 2016-10-31 DIAGNOSIS — Z7984 Long term (current) use of oral hypoglycemic drugs: Secondary | ICD-10-CM

## 2016-10-31 LAB — URINALYSIS, COMPLETE (UACMP) WITH MICROSCOPIC
BACTERIA UA: NONE SEEN
BILIRUBIN URINE: NEGATIVE
Glucose, UA: NEGATIVE mg/dL
Hgb urine dipstick: NEGATIVE
KETONES UR: NEGATIVE mg/dL
Nitrite: POSITIVE — AB
PH: 5 (ref 5.0–8.0)
Protein, ur: NEGATIVE mg/dL
Specific Gravity, Urine: 1.02 (ref 1.005–1.030)

## 2016-10-31 LAB — COMPREHENSIVE METABOLIC PANEL
ALT: 45 U/L (ref 14–54)
AST: 31 U/L (ref 15–41)
Albumin: 3.7 g/dL (ref 3.5–5.0)
Alkaline Phosphatase: 57 U/L (ref 38–126)
Anion gap: 7 (ref 5–15)
BUN: 12 mg/dL (ref 6–20)
CHLORIDE: 107 mmol/L (ref 101–111)
CO2: 27 mmol/L (ref 22–32)
CREATININE: 0.7 mg/dL (ref 0.44–1.00)
Calcium: 9.2 mg/dL (ref 8.9–10.3)
GFR calc Af Amer: >60 mL/min (ref >60)
GFR calc non Af Amer: >60 mL/min (ref >60)
Glucose, Bld: 144 mg/dL — ABNORMAL HIGH (ref 65–99)
POTASSIUM: 3.7 mmol/L (ref 3.5–5.1)
SODIUM: 141 mmol/L (ref 135–145)
Total Bilirubin: 0.5 mg/dL (ref 0.3–1.2)
Total Protein: 7.3 g/dL (ref 6.5–8.1)

## 2016-10-31 LAB — CBC
HEMATOCRIT: 43.2 % (ref 35.0–47.0)
Hemoglobin: 14.9 g/dL (ref 12.0–16.0)
MCH: 31.4 pg (ref 26.0–34.0)
MCHC: 34.5 g/dL (ref 32.0–36.0)
MCV: 91.1 fL (ref 80.0–100.0)
PLATELETS: 290 10*3/uL (ref 150–440)
RBC: 4.74 MIL/uL (ref 3.80–5.20)
RDW: 13.2 % (ref 11.5–14.5)
WBC: 9.4 10*3/uL (ref 3.6–11.0)

## 2016-10-31 LAB — LIPASE, BLOOD: LIPASE: 22 U/L (ref 11–51)

## 2016-10-31 MED ORDER — FENTANYL CITRATE (PF) 100 MCG/2ML IJ SOLN
INTRAMUSCULAR | Status: AC
  Filled 2016-10-31: qty 2

## 2016-10-31 MED ORDER — FENTANYL CITRATE (PF) 100 MCG/2ML IJ SOLN
50.0000 ug | INTRAMUSCULAR | Status: DC | PRN
Start: 2016-10-31 — End: 2016-11-01
  Administered 2016-10-31: 50 ug via INTRAVENOUS

## 2016-10-31 NOTE — ED Notes (Signed)
Spoke with Dr Archie Balboa and no further orders received.

## 2016-10-31 NOTE — ED Notes (Signed)
IV placed by EMS removed due to patient having to wait in lobby for treatment room.

## 2016-10-31 NOTE — ED Triage Notes (Signed)
Patient reports having mid abdominal pain - across navel. Reports history of diverticulitis.

## 2016-11-01 ENCOUNTER — Inpatient Hospital Stay
Admission: EM | Admit: 2016-11-01 | Discharge: 2016-11-14 | DRG: 330 | Disposition: A | Discharge status: Home / Self Care | Payer: Managed Care, Other (non HMO) | Attending: Surgery | Admitting: Surgery

## 2016-11-01 ENCOUNTER — Emergency Department: Payer: Managed Care, Other (non HMO)

## 2016-11-01 DIAGNOSIS — Z7984 Long term (current) use of oral hypoglycemic drugs: Secondary | ICD-10-CM | POA: Diagnosis not present

## 2016-11-01 DIAGNOSIS — Z6841 Body Mass Index (BMI) 40.0 and over, adult: Secondary | ICD-10-CM | POA: Diagnosis not present

## 2016-11-01 DIAGNOSIS — M5136 Other intervertebral disc degeneration, lumbar region: Secondary | ICD-10-CM | POA: Diagnosis present

## 2016-11-01 DIAGNOSIS — K5792 Diverticulitis of intestine, part unspecified, without perforation or abscess without bleeding: Secondary | ICD-10-CM | POA: Diagnosis present

## 2016-11-01 DIAGNOSIS — N390 Urinary tract infection, site not specified: Secondary | ICD-10-CM | POA: Diagnosis present

## 2016-11-01 DIAGNOSIS — G8929 Other chronic pain: Secondary | ICD-10-CM | POA: Diagnosis present

## 2016-11-01 DIAGNOSIS — M549 Dorsalgia, unspecified: Secondary | ICD-10-CM | POA: Diagnosis present

## 2016-11-01 DIAGNOSIS — K5732 Diverticulitis of large intestine without perforation or abscess without bleeding: Secondary | ICD-10-CM | POA: Diagnosis not present

## 2016-11-01 DIAGNOSIS — E669 Obesity, unspecified: Secondary | ICD-10-CM

## 2016-11-01 DIAGNOSIS — M1990 Unspecified osteoarthritis, unspecified site: Secondary | ICD-10-CM | POA: Diagnosis present

## 2016-11-01 DIAGNOSIS — K5720 Diverticulitis of large intestine with perforation and abscess without bleeding: Secondary | ICD-10-CM | POA: Diagnosis present

## 2016-11-01 DIAGNOSIS — K567 Ileus, unspecified: Secondary | ICD-10-CM | POA: Diagnosis not present

## 2016-11-01 DIAGNOSIS — R6889 Other general symptoms and signs: Secondary | ICD-10-CM | POA: Diagnosis not present

## 2016-11-01 DIAGNOSIS — E119 Type 2 diabetes mellitus without complications: Secondary | ICD-10-CM | POA: Diagnosis not present

## 2016-11-01 DIAGNOSIS — E1169 Type 2 diabetes mellitus with other specified complication: Secondary | ICD-10-CM

## 2016-11-01 DIAGNOSIS — Z91040 Latex allergy status: Secondary | ICD-10-CM | POA: Diagnosis not present

## 2016-11-01 DIAGNOSIS — Z7982 Long term (current) use of aspirin: Secondary | ICD-10-CM | POA: Diagnosis not present

## 2016-11-01 DIAGNOSIS — K9409 Other complications of colostomy: Secondary | ICD-10-CM | POA: Diagnosis not present

## 2016-11-01 DIAGNOSIS — Z7401 Bed confinement status: Secondary | ICD-10-CM | POA: Diagnosis not present

## 2016-11-01 DIAGNOSIS — Z79899 Other long term (current) drug therapy: Secondary | ICD-10-CM | POA: Diagnosis not present

## 2016-11-01 DIAGNOSIS — I10 Essential (primary) hypertension: Secondary | ICD-10-CM | POA: Diagnosis not present

## 2016-11-01 DIAGNOSIS — Z4659 Encounter for fitting and adjustment of other gastrointestinal appliance and device: Secondary | ICD-10-CM

## 2016-11-01 LAB — COMPREHENSIVE METABOLIC PANEL
ALBUMIN: 3.2 g/dL — AB (ref 3.5–5.0)
ALT: 38 U/L (ref 14–54)
AST: 32 U/L (ref 15–41)
Alkaline Phosphatase: 47 U/L (ref 38–126)
Anion gap: 5 (ref 5–15)
BUN: 10 mg/dL (ref 6–20)
CHLORIDE: 106 mmol/L (ref 101–111)
CO2: 25 mmol/L (ref 22–32)
CREATININE: 0.77 mg/dL (ref 0.44–1.00)
Calcium: 8 mg/dL — ABNORMAL LOW (ref 8.9–10.3)
GFR calc Af Amer: >60 mL/min (ref >60)
GFR calc non Af Amer: >60 mL/min (ref >60)
GLUCOSE: 216 mg/dL — AB (ref 65–99)
Potassium: 3.8 mmol/L (ref 3.5–5.1)
SODIUM: 136 mmol/L (ref 135–145)
Total Bilirubin: 0.7 mg/dL (ref 0.3–1.2)
Total Protein: 6.5 g/dL (ref 6.5–8.1)

## 2016-11-01 LAB — GLUCOSE, CAPILLARY
GLUCOSE-CAPILLARY: 136 mg/dL — AB (ref 65–99)
GLUCOSE-CAPILLARY: 92 mg/dL (ref 65–99)
Glucose-Capillary: 151 mg/dL — ABNORMAL HIGH (ref 65–99)
Glucose-Capillary: 121 mg/dL — ABNORMAL HIGH (ref 65–99)

## 2016-11-01 LAB — CBC
HCT: 38.5 % (ref 35.0–47.0)
Hemoglobin: 13.2 g/dL (ref 12.0–16.0)
MCH: 31.3 pg (ref 26.0–34.0)
MCHC: 34.4 g/dL (ref 32.0–36.0)
MCV: 91 fL (ref 80.0–100.0)
PLATELETS: 227 10*3/uL (ref 150–440)
RBC: 4.23 MIL/uL (ref 3.80–5.20)
RDW: 13.1 % (ref 11.5–14.5)
WBC: 11.3 10*3/uL — ABNORMAL HIGH (ref 3.6–11.0)

## 2016-11-01 MED ORDER — CIPROFLOXACIN IN D5W 400 MG/200ML IV SOLN
400.0000 mg | Freq: Once | INTRAVENOUS | Status: AC
  Administered 2016-11-01: 400 mg via INTRAVENOUS
  Filled 2016-11-01: qty 200

## 2016-11-01 MED ORDER — MORPHINE SULFATE (PF) 4 MG/ML IV SOLN
4.0000 mg | Freq: Once | INTRAVENOUS | Status: AC
  Administered 2016-11-01: 4 mg via INTRAVENOUS
  Filled 2016-11-01: qty 1

## 2016-11-01 MED ORDER — ONDANSETRON HCL 4 MG/2ML IJ SOLN
4.0000 mg | Freq: Once | INTRAMUSCULAR | Status: AC
  Administered 2016-11-01: 4 mg via INTRAVENOUS
  Filled 2016-11-01: qty 2

## 2016-11-01 MED ORDER — ASPIRIN EC 81 MG PO TBEC
81.0000 mg | DELAYED_RELEASE_TABLET | Freq: Every day | ORAL | Status: DC
  Administered 2016-11-01 – 2016-11-14 (×10): 81 mg via ORAL
  Filled 2016-11-01 (×12): qty 1

## 2016-11-01 MED ORDER — IOPAMIDOL (ISOVUE-300) INJECTION 61%
30.0000 mL | Freq: Once | INTRAVENOUS | Status: AC | PRN
  Administered 2016-11-01: 30 mL via ORAL

## 2016-11-01 MED ORDER — ONDANSETRON HCL 4 MG/2ML IJ SOLN
4.0000 mg | Freq: Four times a day (QID) | INTRAMUSCULAR | Status: DC | PRN
  Administered 2016-11-02 – 2016-11-13 (×15): 4 mg via INTRAVENOUS
  Filled 2016-11-01 (×15): qty 2

## 2016-11-01 MED ORDER — SODIUM CHLORIDE 0.9 % IV BOLUS (SEPSIS)
1000.0000 mL | Freq: Once | INTRAVENOUS | Status: AC
  Administered 2016-11-01: 1000 mL via INTRAVENOUS

## 2016-11-01 MED ORDER — MORPHINE SULFATE (PF) 2 MG/ML IV SOLN
2.0000 mg | INTRAVENOUS | Status: DC | PRN
  Administered 2016-11-01 – 2016-11-09 (×20): 2 mg via INTRAVENOUS
  Filled 2016-11-01 (×20): qty 1

## 2016-11-01 MED ORDER — LACTATED RINGERS IV SOLN
INTRAVENOUS | Status: DC
  Administered 2016-11-01 – 2016-11-02 (×4): via INTRAVENOUS
  Administered 2016-11-02: 1000 mL via INTRAVENOUS
  Administered 2016-11-02 – 2016-11-05 (×9): via INTRAVENOUS
  Administered 2016-11-06: 1000 mL via INTRAVENOUS
  Administered 2016-11-06 – 2016-11-08 (×8): via INTRAVENOUS
  Administered 2016-11-09 (×2): 1000 mL via INTRAVENOUS
  Administered 2016-11-10 – 2016-11-13 (×8): via INTRAVENOUS

## 2016-11-01 MED ORDER — INSULIN ASPART 100 UNIT/ML ~~LOC~~ SOLN
0.0000 [IU] | Freq: Three times a day (TID) | SUBCUTANEOUS | Status: DC
  Administered 2016-11-01 (×2): 1 [IU] via SUBCUTANEOUS
  Administered 2016-11-02: 2 [IU] via SUBCUTANEOUS
  Administered 2016-11-02: 1 [IU] via SUBCUTANEOUS
  Administered 2016-11-03 (×2): 2 [IU] via SUBCUTANEOUS
  Administered 2016-11-03: 3 [IU] via SUBCUTANEOUS
  Administered 2016-11-04 – 2016-11-05 (×4): 2 [IU] via SUBCUTANEOUS
  Administered 2016-11-05 – 2016-11-07 (×7): 1 [IU] via SUBCUTANEOUS
  Administered 2016-11-09: 2 [IU] via SUBCUTANEOUS
  Administered 2016-11-09 – 2016-11-12 (×8): 1 [IU] via SUBCUTANEOUS
  Administered 2016-11-12: 2 [IU] via SUBCUTANEOUS
  Administered 2016-11-12 – 2016-11-13 (×4): 1 [IU] via SUBCUTANEOUS
  Administered 2016-11-14 (×2): 2 [IU] via SUBCUTANEOUS
  Filled 2016-11-01 (×4): qty 1
  Filled 2016-11-01 (×2): qty 2
  Filled 2016-11-01 (×14): qty 1
  Filled 2016-11-01: qty 2
  Filled 2016-11-01 (×12): qty 1

## 2016-11-01 MED ORDER — ONDANSETRON HCL 4 MG PO TABS
4.0000 mg | ORAL_TABLET | Freq: Four times a day (QID) | ORAL | Status: DC | PRN
Start: 2016-11-01 — End: 2016-11-14
  Administered 2016-11-09: 4 mg via ORAL
  Filled 2016-11-01: qty 1

## 2016-11-01 MED ORDER — METRONIDAZOLE IN NACL 5-0.79 MG/ML-% IV SOLN: 500.0000 mg | Freq: Once | INTRAVENOUS | Status: DC

## 2016-11-01 MED ORDER — HYDROCODONE-ACETAMINOPHEN 5-325 MG PO TABS
1.0000 | ORAL_TABLET | ORAL | Status: DC | PRN
  Administered 2016-11-01 – 2016-11-13 (×9): 1 via ORAL
  Filled 2016-11-01 (×9): qty 1

## 2016-11-01 MED ORDER — HEPARIN SODIUM (PORCINE) 5000 UNIT/ML IJ SOLN
5000.0000 [IU] | Freq: Three times a day (TID) | INTRAMUSCULAR | Status: DC
  Administered 2016-11-01 – 2016-11-14 (×37): 5000 [IU] via SUBCUTANEOUS
  Filled 2016-11-01 (×36): qty 1

## 2016-11-01 MED ORDER — IOPAMIDOL (ISOVUE-300) INJECTION 61%
100.0000 mL | Freq: Once | INTRAVENOUS | Status: AC | PRN
  Administered 2016-11-01: 100 mL via INTRAVENOUS

## 2016-11-01 MED ORDER — LOSARTAN POTASSIUM 50 MG PO TABS
50.0000 mg | ORAL_TABLET | Freq: Every day | ORAL | Status: DC
  Administered 2016-11-01 – 2016-11-14 (×10): 50 mg via ORAL
  Filled 2016-11-01 (×12): qty 1

## 2016-11-01 MED ORDER — BISACODYL 10 MG RE SUPP
10.0000 mg | Freq: Once | RECTAL | Status: AC
  Administered 2016-11-01: 10 mg via RECTAL
  Filled 2016-11-01: qty 1

## 2016-11-01 MED ORDER — METRONIDAZOLE IN NACL 5-0.79 MG/ML-% IV SOLN
500.0000 mg | Freq: Three times a day (TID) | INTRAVENOUS | Status: DC
  Administered 2016-11-01 – 2016-11-12 (×32): 500 mg via INTRAVENOUS
  Filled 2016-11-01 (×35): qty 100

## 2016-11-01 MED ORDER — CIPROFLOXACIN IN D5W 400 MG/200ML IV SOLN
400.0000 mg | Freq: Two times a day (BID) | INTRAVENOUS | Status: DC
  Administered 2016-11-01 – 2016-11-12 (×21): 400 mg via INTRAVENOUS
  Filled 2016-11-01 (×24): qty 200

## 2016-11-01 MED ORDER — GLIPIZIDE 2.5 MG HALF TABLET
2.5000 mg | ORAL_TABLET | Freq: Every day | ORAL | Status: DC
  Administered 2016-11-01: 2.5 mg via ORAL
  Filled 2016-11-01: qty 1

## 2016-11-01 NOTE — H&P (Signed)
Kara [name] is an 66 y.o. female.    Chief Complaint: Lower abdominal pain  HPI: This patient with a history of acute diverticulitis with microperforation who was doing well until yesterday morning. She developed lower abdominal pain which has gradually worsened she's had no fevers or chills but has vomited one time. This is very similar in character to her prior diverticulitis. She is required to prior admissions for IV antibiotics recently. I had seen the patient in the office last week at which time her symptoms were completely resolved. This represents a new finding.  Past Medical History:  Diagnosis Date  . Diabetes mellitus without complication ([place])   . Hypertension     Past Surgical History:  Procedure Laterality Date  . CARPAL TUNNEL RELEASE Bilateral   . KNEE SURGERY     torn menicus    Family History  Problem Relation Age of Onset  . Diabetes Mother   . Heart disease Mother   . Cancer Mother   . Heart disease Father   . Diabetes Sister   . Heart disease Sister    Social History:  reports that she has never smoked. She has never used smokeless tobacco. She reports that she does not drink alcohol or use drugs.  Allergies:  Allergies  Allergen Reactions  . Latex      (Not in a hospital admission)   Review of Systems  Constitutional: Negative for chills and fever.  HENT: Negative.   Eyes: Negative.   Respiratory: Negative.   Cardiovascular: Negative.   Gastrointestinal: Positive for abdominal pain, diarrhea, nausea and vomiting. Negative for blood in stool, constipation and heartburn.  Genitourinary: Negative.   Musculoskeletal: Negative.   Skin: Negative.   Neurological: Negative.   Endo/Heme/Allergies: Negative.   Psychiatric/Behavioral: Negative.      Physical Exam:  BP 139/85 (BP Location: Left Arm)   Pulse (!) 113   Temp 98.5 F (36.9 C) (Oral)   Resp (!) 22   Wt 233 lb (105.7 kg)   SpO2 94%   BMI 41.27 kg/m   Physical Exam   Constitutional: She is oriented to person, place, and time. No distress.  Fairly comfortable appearing obese female patient laying on her left side.  HENT:  Head: Normocephalic and atraumatic.  Eyes: Pupils are equal, round, and reactive to light. Right eye exhibits no discharge. Left eye exhibits no discharge. No scleral icterus.  Neck: Normal range of motion.  Cardiovascular: Normal rate, regular rhythm and normal heart sounds.   Pulmonary/Chest: Effort normal and breath sounds normal. No respiratory distress. She has no wheezes. She has no rales.  Abdominal: Soft. She exhibits no distension. There is tenderness. There is guarding. There is no rebound.  Tenderness in the suprapubic and left lower quadrant area with minimal guarding no rebound or percussion tenderness  Musculoskeletal: Normal range of motion. She exhibits no edema or tenderness.  Lymphadenopathy:    She has no cervical adenopathy.  Neurological: She is alert and oriented to person, place, and time.  Skin: Skin is warm and dry. No rash noted. She is not diaphoretic. No erythema.  Psychiatric: Mood and affect normal.  Vitals reviewed.       Results for orders placed or performed during the hospital encounter of 11/01/16 (from the past 48 hour(s))  Lipase, blood     Status: None   Collection Time: 10/31/16  9:25 PM  Result Value Ref Range   Lipase 22 11 - 51 U/L  Comprehensive metabolic panel  Status: Abnormal   Collection Time: 10/31/16  9:25 PM  Result Value Ref Range   Sodium 141 135 - 145 mmol/L   Potassium 3.7 3.5 - 5.1 mmol/L   Chloride 107 101 - 111 mmol/L   CO2 27 22 - 32 mmol/L   Glucose, Bld 144 (H) 65 - 99 mg/dL   BUN 12 6 - 20 mg/dL   Creatinine, Ser 0.70 0.44 - 1.00 mg/dL   Calcium 9.2 8.9 - 10.3 mg/dL   Total Protein 7.3 6.5 - 8.1 g/dL   Albumin 3.7 3.5 - 5.0 g/dL   AST 31 15 - 41 U/L   ALT 45 14 - 54 U/L   Alkaline Phosphatase 57 38 - 126 U/L   Total Bilirubin 0.5 0.3 - 1.2 mg/dL   GFR  calc non Af Amer >60 >60 mL/min   GFR calc Af Amer >60 >60 mL/min    Comment: (NOTE) The eGFR has been calculated using the CKD EPI equation. This calculation has not been validated in all clinical situations. eGFR's persistently <60 mL/min signify possible Chronic Kidney Disease.    Anion gap 7 5 - 15  CBC     Status: None   Collection Time: 10/31/16  9:25 PM  Result Value Ref Range   WBC 9.4 3.6 - 11.0 K/uL   RBC 4.74 3.80 - 5.20 MIL/uL   Hemoglobin 14.9 12.0 - 16.0 g/dL   HCT 43.2 35.0 - 47.0 %   MCV 91.1 80.0 - 100.0 fL   MCH 31.4 26.0 - 34.0 pg   MCHC 34.5 32.0 - 36.0 g/dL   RDW 13.2 11.5 - 14.5 %   Platelets 290 150 - 440 K/uL  Urinalysis, Complete w Microscopic     Status: Abnormal   Collection Time: 10/31/16  9:25 PM  Result Value Ref Range   Color, Urine YELLOW (A) YELLOW   APPearance CLEAR (A) CLEAR   Specific Gravity, Urine 1.020 1.005 - 1.030   pH 5.0 5.0 - 8.0   Glucose, UA NEGATIVE NEGATIVE mg/dL   Hgb urine dipstick NEGATIVE NEGATIVE   Bilirubin Urine NEGATIVE NEGATIVE   Ketones, ur NEGATIVE NEGATIVE mg/dL   Protein, ur NEGATIVE NEGATIVE mg/dL   Nitrite POSITIVE (A) NEGATIVE   Leukocytes, UA SMALL (A) NEGATIVE   RBC / HPF 0-5 0 - 5 RBC/hpf   WBC, UA 6-30 0 - 5 WBC/hpf   Bacteria, UA NONE SEEN NONE SEEN   Squamous Epithelial / LPF 0-5 (A) NONE SEEN   Mucous PRESENT    Ct Abdomen Pelvis W Contrast  Result Date: 11/01/2016 CLINICAL DATA:  Mid abdominal pain across the navel EXAM: CT ABDOMEN AND PELVIS WITH CONTRAST TECHNIQUE: Multidetector CT imaging of the abdomen and pelvis was performed using the standard protocol following bolus administration of intravenous contrast. CONTRAST:  149m ISOVUE-300 IOPAMIDOL (ISOVUE-300) INJECTION 61% COMPARISON:  10/03/2016 FINDINGS: Lower chest: Lung bases demonstrate no acute consolidation or pleural effusion. The heart is nonenlarged. Hepatobiliary: Hepatic steatosis. Small stones in the gallbladder. No biliary dilatation  Pancreas: Unremarkable. No pancreatic ductal dilatation or surrounding inflammatory changes. Spleen: Normal in size without focal abnormality. Adrenals/Urinary Tract: Adrenal glands are unremarkable. Kidneys are normal, without renal calculi, focal lesion, or hydronephrosis. Bladder is unremarkable. Stomach/Bowel: The stomach is nonenlarged. There is no evidence for bowel obstruction. Extensive sigmoid colon diverticular disease. Hazy fat stranding around the sigmoid colon with fluid and gas collections adjacent to the sigmoid colon, measuring 4.1 x 2.4 cm. Vascular/Lymphatic: Aortic atherosclerosis. Small lymph node anterior  to the IVC measuring 6 mm. No significantly enlarged pelvic lymph nodes Reproductive: Uterus unremarkable. Left adnexal thickening adjacent to the sigmoid colon inflammatory changes. No adnexal mass Other: No free air. Musculoskeletal: Grade 1 anterolisthesis of L4 on L5. Degenerative changes at multiple levels. IMPRESSION: 1. Sigmoid colon diverticular disease with moderate inflammation around the sigmoid colon consistent with diverticulitis. Fluid and gas collection adjacent to the sigmoid colon consistent with localized perforation, uncertain if this represents residual of the previously noted perforation on the CT 1 month ago or if this represents repeat contained perforation. No drainable abscess visualized at this time. 2. Gallstones 3. Hepatic steatosis Electronically Signed   By: Donavan Foil M.D.   On: 11/01/2016 02:40     Assessment/Plan  Labs and CT scan are personally reviewed showing signs of fluid collection around a very inflamed area of the sigmoid colon with acute diverticulitis. This is a patient well known to the surgical service with 2 recent admissions now with a new onset of abdominal pain after her symptoms of completely resolved earlier this week. She requires admission and again to the hospital with IV antibiotics. I discussed with her the fact that this is the  third episode in several weeks and that this may warrant a colostomy as there is nothing to drain at this point that would be amenable to CT-guided drainage. She understood but prefers to attempt IV antibiotics first.  Florene Glen, MD, FACS

## 2016-11-01 NOTE — Care Management (Signed)
Third admission for similar sx in past six months.  There are no issues with patient's management of her treatment regimen that are contributing to readmission.

## 2016-11-01 NOTE — ED Provider Notes (Signed)
[facility] Emergency Department Provider Note  Time seen: 12:59 AM  I have reviewed the triage vital signs and the nursing notes.   HISTORY  Chief Complaint Abdominal Pain    HPI Kara [name] is a 66 y.o. female with a past medical history of hypertension, diabetes, recently diagnosed with diverticulitis who presents the emergency department with significant left-sided abdominal pain. According to the patient approximately 1 month ago she was diagnosed with diverticulitis, requiring hospital admission. She states her initial antibiotics fail to resolve the diverticulitis but her antibiotics were changed and she had been feeling better. States the pain had completely resolved until this morning when she once again developed diarrhea nausea vomiting and severe left-sided abdominal pain. Denies fever. Denies black or bloody stool. Denies dysuria or hematuria. Describes her pain as moderate to severe aching pain in the left abdomen.  Past Medical History:  Diagnosis Date  . Diabetes mellitus without complication ([place])   . Hypertension     Patient Active Problem List   Diagnosis Date Noted  . Diverticulitis 10/03/2016  . Diverticulitis large intestine 09/29/2016  . Benign essential hypertension 08/26/2016  . DDD (degenerative disc disease), lumbar 07/14/2016  . Acute non-recurrent maxillary sinusitis 05/21/2016  . Acute hip pain, left 02/23/2016  . Chronic low back pain without sciatica 11/14/2015  . Cough 07/30/2015  . Nocturnal leg cramps 02/20/2015  . Diabetes mellitus type 2 in obese ([place]) 11/12/2014  . BP (high blood pressure) 11/12/2014    Past Surgical History:  Procedure Laterality Date  . CARPAL TUNNEL RELEASE Bilateral   . KNEE SURGERY     torn menicus    Prior to Admission medications   Medication Sig Start Date End Date Taking? Authorizing Provider  aspirin EC 81 MG tablet Take 81 mg by mouth daily.    [provider]   cyclobenzaprine (FLEXERIL) 10 MG tablet Take 10 mg by mouth daily as needed. 07/05/16   [provider]  diclofenac (VOLTAREN) 75 MG EC tablet Take 75 mg by mouth daily. 07/05/16   [provider]  glipiZIDE (GLUCOTROL) 5 MG tablet Take 0.5 tablets (2.5 mg total) by mouth daily before breakfast. 02/04/16   Roselee Nova, MD  Hydrocodone-Acetaminophen (VICODIN) 5-300 MG TABS Take 1 tablet by mouth every 4 (four) hours as needed. 10/06/16   Florene Glen, MD  losartan (COZAAR) 50 MG tablet Take 50 mg by mouth daily. 07/21/16   [provider]  Multiple Vitamin (MULTI-VITAMINS) TABS Take 1 tablet by mouth daily.    [provider]    Allergies  Allergen Reactions  . Latex     Family History  Problem Relation Age of Onset  . Diabetes Mother   . Heart disease Mother   . Cancer Mother   . Heart disease Father   . Diabetes Sister   . Heart disease Sister     Social History Social History  Substance Use Topics  . Smoking status: Never Smoker  . Smokeless tobacco: Never Used  . Alcohol use No    Review of Systems Constitutional: Negative for fever. Cardiovascular: Negative for chest pain. Respiratory: Negative for shortness of breath. Gastrointestinal: Left-sided abdominal pain. Positive nausea vomiting and diarrhea Genitourinary: Negative for dysuria. Musculoskeletal: Negative for back pain. Skin: Negative for rash. Neurological: Negative for headache All other ROS negative  ____________________________________________   PHYSICAL EXAM:  VITAL SIGNS: ED Triage Vitals  Enc Vitals Group     BP 10/31/16 2114 Marland Kitchen)  153/78     Pulse Rate 10/31/16 2114 (!) 105     Resp 10/31/16 2114 (!) 22     Temp 10/31/16 2114 98.5 F (36.9 C)     Temp Source 10/31/16 2114 Oral     SpO2 10/31/16 2114 98 %     Weight 10/31/16 2120 233 lb (105.7 kg)     Height --      Head Circumference --      Peak Flow --      Pain Score 10/31/16 2121 8     Pain  Loc --      Pain Edu? --      Excl. in [place]? --     Constitutional: Alert and oriented. Well appearing and in no distress. Eyes: Normal exam ENT   Head: Normocephalic and atraumatic   Mouth/Throat: Mucous membranes are moist. Cardiovascular: Normal rate, regular rhythm. No murmur Respiratory: Normal respiratory effort without tachypnea nor retractions. Breath sounds are clear  Gastrointestinal: Soft, moderate left sided abdominal tenderness palpation. Slight guarding of the left side. No rebound tenderness. No distention. Musculoskeletal: Nontender with normal range of motion in all extremities.  Neurologic:  Normal speech and language. No gross focal neurologic deficits Skin:  Skin is warm, dry and intact.  Psychiatric: Mood and affect are normal.   ____________________________________________    EKG  EKG reviewed and interpreted by myself shows normal sinus rhythm at 100 bpm, narrow QRS, left axis deviation, normal intervals, no concerning ST changes.  ____________________________________________    RADIOLOGY  IMPRESSION: 1. Sigmoid colon diverticular disease with moderate inflammation around the sigmoid colon consistent with diverticulitis. Fluid and gas collection adjacent to the sigmoid colon consistent with localized perforation, uncertain if this represents residual of the previously noted perforation on the CT 1 month ago or if this represents repeat contained perforation. No drainable abscess visualized at this time. 2. Gallstones 3. Hepatic steatosis  ____________________________________________   INITIAL IMPRESSION / ASSESSMENT AND PLAN / ED COURSE  Pertinent labs & imaging results that were available during my care of the patient were reviewed by me and considered in my medical decision making (see chart for details).  Patient presents the emergency department with left-sided abdominal pain along with nausea vomiting and diarrhea. Patient recently  diagnosed with diverticulitis 1 month ago, states the pain had completely resolved until this morning. We will check labs and obtain a CT scan abdomen/pelvis to further evaluate.  Labs are largely within normal limits we'll obtain a CT scan given the patient's moderate tenderness to palpation. We'll treat pain and nausea, IV hydrate while awaiting results the patient agreeable to plan.  Patient's urinalysis is nitrite positive however given the patient's moderate tenderness in the left side and left lower quadrant I doubt urinary pathology alone.  CT consistent with diverticulitis with localized perforation is unclear if this perforation is new or old. Given the patient's complete resolution of discomfort now with acute pain at this patient to general surgery. He'll be admitting to the hospital for further treatment. We will dose IV ciprofloxacin and Flagyl.  ____________________________________________   FINAL CLINICAL IMPRESSION(S) / ED DIAGNOSES  Urinary tract infection Left-sided abdominal pain Diverticulitis with perforation   Harvest Dark, MD 11/01/16 [phone]

## 2016-11-01 NOTE — Progress Notes (Signed)
CC: Diverticulitis Subjective: Patient reports she might be mildly improved from admission but continues to have abdominal pain. She's had a pressure sensation being a bowel movement and was provided a suppository earlier today with some success. She denies any nausea or vomiting. Otherwise unchanged since admission.  Objective: Vital signs in last 24 hours: Temp:  [98.5 F (36.9 C)-99.3 F (37.4 C)] 99.2 F (37.3 C) (06/11 1234) Pulse Rate:  [93-113] 93 (06/11 1234) Resp:  [18-22] 18 (06/11 1234) BP: (109-153)/(49-85) 109/49 (06/11 1234) SpO2:  [86 %-98 %] 94 % (06/11 1234) Weight:  [105.7 kg (233 lb)] 105.7 kg (233 lb) (06/10 2120) Last BM Date: 10/31/16  Intake/Output from previous day: 06/10 0701 - 06/11 0700 In: 300 [P.O.:100; IV Piggyback:200] Out: -  Intake/Output this shift: Total I/O In: -  Out: 800 [Urine:800]  Physical exam:  Gen.: No acute distress  chest: Clear to auscultation Heart: Regular rhythm Abdomen: Soft, tender to palpation left lower quadrant, nondistended  Lab Results: CBC   Recent Labs  10/31/16 2125 11/01/16 0501  WBC 9.4 11.3*  HGB 14.9 13.2  HCT 43.2 38.5  PLT 290 227   BMET  Recent Labs  10/31/16 2125 11/01/16 0501  NA 141 136  K 3.7 3.8  CL 107 106  CO2 27 25  GLUCOSE 144* 216*  BUN 12 10  CREATININE 0.70 0.77  CALCIUM 9.2 8.0*   PT/INR No results for input(s): LABPROT, INR in the last 72 hours. ABG No results for input(s): PHART, HCO3 in the last 72 hours.  Invalid input(s): PCO2, PO2  Studies/Results: Ct Abdomen Pelvis W Contrast  Result Date: 11/01/2016 CLINICAL DATA:  Mid abdominal pain across the navel EXAM: CT ABDOMEN AND PELVIS WITH CONTRAST TECHNIQUE: Multidetector CT imaging of the abdomen and pelvis was performed using the standard protocol following bolus administration of intravenous contrast. CONTRAST:  142mL ISOVUE-300 IOPAMIDOL (ISOVUE-300) INJECTION 61% COMPARISON:  10/03/2016 FINDINGS: Lower chest:  Lung bases demonstrate no acute consolidation or pleural effusion. The heart is nonenlarged. Hepatobiliary: Hepatic steatosis. Small stones in the gallbladder. No biliary dilatation Pancreas: Unremarkable. No pancreatic ductal dilatation or surrounding inflammatory changes. Spleen: Normal in size without focal abnormality. Adrenals/Urinary Tract: Adrenal glands are unremarkable. Kidneys are normal, without renal calculi, focal lesion, or hydronephrosis. Bladder is unremarkable. Stomach/Bowel: The stomach is nonenlarged. There is no evidence for bowel obstruction. Extensive sigmoid colon diverticular disease. Hazy fat stranding around the sigmoid colon with fluid and gas collections adjacent to the sigmoid colon, measuring 4.1 x 2.4 cm. Vascular/Lymphatic: Aortic atherosclerosis. Small lymph node anterior to the IVC measuring 6 mm. No significantly enlarged pelvic lymph nodes Reproductive: Uterus unremarkable. Left adnexal thickening adjacent to the sigmoid colon inflammatory changes. No adnexal mass Other: No free air. Musculoskeletal: Grade 1 anterolisthesis of L4 on L5. Degenerative changes at multiple levels. IMPRESSION: 1. Sigmoid colon diverticular disease with moderate inflammation around the sigmoid colon consistent with diverticulitis. Fluid and gas collection adjacent to the sigmoid colon consistent with localized perforation, uncertain if this represents residual of the previously noted perforation on the CT 1 month ago or if this represents repeat contained perforation. No drainable abscess visualized at this time. 2. Gallstones 3. Hepatic steatosis Electronically Signed   By: Donavan Foil M.D.   On: 11/01/2016 02:40    Anti-infectives: Anti-infectives    Start     Dose/Rate Route Frequency Ordered Stop   11/01/16 1600  ciprofloxacin (CIPRO) IVPB 400 mg     400 mg 200 mL/hr over 60  Minutes Intravenous Every 12 hours 11/01/16 1208     11/01/16 1300  metroNIDAZOLE (FLAGYL) IVPB 500 mg     500  mg 100 mL/hr over 60 Minutes Intravenous Every 8 hours 11/01/16 1208     11/01/16 0315  ciprofloxacin (CIPRO) IVPB 400 mg     400 mg 200 mL/hr over 60 Minutes Intravenous  Once 11/01/16 0311 11/01/16 0450   11/01/16 0315  metroNIDAZOLE (FLAGYL) IVPB 500 mg  Status:  Discontinued     500 mg 100 mL/hr over 60 Minutes Intravenous  Once 11/01/16 9509 11/01/16 1208      Assessment/Plan:  66 year old female admitted with recurrent sigmoid diverticulitis. This is her third admission for diverticulitis within the last several months. Had a long conversation with the patient about whether not she would require an urgent surgical intervention. Discussed we will give her an additional 24 hours of IV antibiotic therapy for improvement. Should she continue to fail to improve, we will plan for an operative intervention as early as tomorrow. Patient voiced understanding. Plan for nothing by mouth after midnight. Reevaluation in the morning for possible surgical intervention.  Alanah Sakuma T. Adonis Huguenin, MD, [facility] General Surgeon [facility]  Day [facility] [phone] Night [facility] [phone] 11/01/2016

## 2016-11-01 NOTE — Progress Notes (Signed)
Patient requesting medication to help he have BM. States she has not had BM in several days. MD notified. Dulcolax suppository ordered pe MD orders.

## 2016-11-02 ENCOUNTER — Encounter: Payer: Self-pay | Admitting: Anesthesiology

## 2016-11-02 ENCOUNTER — Encounter
Admission: EM | Disposition: A | Discharge status: Home / Self Care | Payer: Self-pay | Source: Home / Self Care | Attending: Surgery

## 2016-11-02 ENCOUNTER — Inpatient Hospital Stay: Payer: Managed Care, Other (non HMO) | Admitting: Anesthesiology

## 2016-11-02 DIAGNOSIS — K5720 Diverticulitis of large intestine with perforation and abscess without bleeding: Principal | ICD-10-CM

## 2016-11-02 HISTORY — PX: COLON SURGERY: SHX602

## 2016-11-02 HISTORY — PX: COLOSTOMY: SHX63

## 2016-11-02 HISTORY — PX: COLON RESECTION SIGMOID: SHX6737

## 2016-11-02 LAB — BASIC METABOLIC PANEL
Anion gap: 5 (ref 5–15)
BUN: 6 mg/dL (ref 6–20)
CALCIUM: 8.3 mg/dL — AB (ref 8.9–10.3)
CO2: 28 mmol/L (ref 22–32)
CREATININE: 0.49 mg/dL (ref 0.44–1.00)
Chloride: 104 mmol/L (ref 101–111)
Glucose, Bld: 190 mg/dL — ABNORMAL HIGH (ref 65–99)
Potassium: 3.8 mmol/L (ref 3.5–5.1)
SODIUM: 137 mmol/L (ref 135–145)

## 2016-11-02 LAB — CBC
HCT: 38.4 % (ref 35.0–47.0)
Hemoglobin: 13.3 g/dL (ref 12.0–16.0)
MCH: 31.7 pg (ref 26.0–34.0)
MCHC: 34.6 g/dL (ref 32.0–36.0)
MCV: 91.7 fL (ref 80.0–100.0)
PLATELETS: 233 10*3/uL (ref 150–440)
RBC: 4.19 MIL/uL (ref 3.80–5.20)
RDW: 13 % (ref 11.5–14.5)
WBC: 7.2 10*3/uL (ref 3.6–11.0)

## 2016-11-02 LAB — GLUCOSE, CAPILLARY
GLUCOSE-CAPILLARY: 148 mg/dL — AB (ref 65–99)
GLUCOSE-CAPILLARY: 113 mg/dL — AB (ref 65–99)
GLUCOSE-CAPILLARY: 147 mg/dL — AB (ref 65–99)
GLUCOSE-CAPILLARY: 181 mg/dL — AB (ref 65–99)
Glucose-Capillary: 158 mg/dL — ABNORMAL HIGH (ref 65–99)

## 2016-11-02 LAB — SURGICAL PCR SCREEN
MRSA, PCR: POSITIVE — AB
Staphylococcus aureus: POSITIVE — AB

## 2016-11-02 SURGERY — COLECTOMY, SIGMOID, OPEN
Anesthesia: General | Wound class: Contaminated

## 2016-11-02 MED ORDER — MORPHINE SULFATE 2 MG/ML IV SOLN
INTRAVENOUS | Status: DC
  Administered 2016-11-02: 17:00:00 via INTRAVENOUS
  Administered 2016-11-02: 12 mg via INTRAVENOUS
  Administered 2016-11-02: 6 mg via INTRAVENOUS
  Administered 2016-11-03: 16.5 mg via INTRAVENOUS
  Administered 2016-11-03: 7.5 mg via INTRAVENOUS
  Administered 2016-11-03: 11:00:00 via INTRAVENOUS
  Administered 2016-11-03: 4.5 mg via INTRAVENOUS
  Administered 2016-11-03 (×2): 9 mg via INTRAVENOUS
  Administered 2016-11-04: 1.5 mg via INTRAVENOUS
  Administered 2016-11-04: 9 mg via INTRAVENOUS
  Administered 2016-11-04: 7.5 mg via INTRAVENOUS
  Administered 2016-11-04: 3 mg via INTRAVENOUS
  Administered 2016-11-04: 0 mg via INTRAVENOUS
  Administered 2016-11-04: 4.5 mg via INTRAVENOUS
  Administered 2016-11-04: 01:00:00 via INTRAVENOUS
  Administered 2016-11-05 (×2): 4.5 mg via INTRAVENOUS
  Administered 2016-11-05: 6 mg via INTRAVENOUS
  Administered 2016-11-05: 4.5 mg via INTRAVENOUS
  Administered 2016-11-05: 0 mg via INTRAVENOUS
  Administered 2016-11-06: 3 mg via INTRAVENOUS
  Administered 2016-11-06: 9.5 mg via INTRAVENOUS
  Administered 2016-11-06: 14.32 mg via INTRAVENOUS
  Administered 2016-11-06 (×2): 4.5 mg via INTRAVENOUS
  Administered 2016-11-06: 3 mg via INTRAVENOUS
  Administered 2016-11-06: 9 mg via INTRAVENOUS
  Administered 2016-11-07: 0 mg via INTRAVENOUS
  Administered 2016-11-07: 4.5 mg via INTRAVENOUS
  Filled 2016-11-02 (×5): qty 25

## 2016-11-02 MED ORDER — HYDROMORPHONE HCL 1 MG/ML IJ SOLN
0.5000 mg | INTRAMUSCULAR | Status: DC | PRN
  Administered 2016-11-02 (×2): 0.5 mg via INTRAVENOUS

## 2016-11-02 MED ORDER — ONDANSETRON HCL 4 MG/2ML IJ SOLN
INTRAMUSCULAR | Status: AC
  Filled 2016-11-02: qty 2

## 2016-11-02 MED ORDER — ONDANSETRON HCL 4 MG/2ML IJ SOLN: 4.0000 mg | Freq: Once | INTRAMUSCULAR | Status: DC | PRN

## 2016-11-02 MED ORDER — FENTANYL CITRATE (PF) 100 MCG/2ML IJ SOLN
INTRAMUSCULAR | Status: AC
  Administered 2016-11-02: 25 ug via INTRAVENOUS
  Filled 2016-11-02: qty 2

## 2016-11-02 MED ORDER — PHENYLEPHRINE HCL 10 MG/ML IJ SOLN
INTRAMUSCULAR | Status: DC | PRN
Start: 2016-11-02 — End: 2016-11-02
  Administered 2016-11-02: 100 ug via INTRAVENOUS

## 2016-11-02 MED ORDER — ONDANSETRON HCL 4 MG/2ML IJ SOLN
INTRAMUSCULAR | Status: DC | PRN
  Administered 2016-11-02: 4 mg via INTRAVENOUS

## 2016-11-02 MED ORDER — SEVOFLURANE IN SOLN
RESPIRATORY_TRACT | Status: AC
  Filled 2016-11-02: qty 250

## 2016-11-02 MED ORDER — FENTANYL CITRATE (PF) 100 MCG/2ML IJ SOLN
INTRAMUSCULAR | Status: AC
  Filled 2016-11-02: qty 2

## 2016-11-02 MED ORDER — BUPIVACAINE HCL (PF) 0.5 % IJ SOLN
INTRAMUSCULAR | Status: AC
  Filled 2016-11-02: qty 30

## 2016-11-02 MED ORDER — SUGAMMADEX SODIUM 200 MG/2ML IV SOLN
INTRAVENOUS | Status: DC | PRN
  Administered 2016-11-02: 200 mg via INTRAVENOUS

## 2016-11-02 MED ORDER — KETOROLAC TROMETHAMINE 30 MG/ML IJ SOLN
INTRAMUSCULAR | Status: DC | PRN
  Administered 2016-11-02: 15 mg via INTRAVENOUS

## 2016-11-02 MED ORDER — LIDOCAINE HCL (PF) 1 % IJ SOLN
INTRAMUSCULAR | Status: AC
  Filled 2016-11-02: qty 30

## 2016-11-02 MED ORDER — ONDANSETRON HCL 4 MG/2ML IJ SOLN: 4.0000 mg | Freq: Four times a day (QID) | INTRAMUSCULAR | Status: DC | PRN

## 2016-11-02 MED ORDER — CHLORHEXIDINE GLUCONATE CLOTH 2 % EX PADS: 6.0000 | MEDICATED_PAD | Freq: Once | CUTANEOUS | Status: DC

## 2016-11-02 MED ORDER — ROCURONIUM BROMIDE 50 MG/5ML IV SOLN
INTRAVENOUS | Status: AC
  Filled 2016-11-02: qty 1

## 2016-11-02 MED ORDER — SODIUM CHLORIDE 0.9% FLUSH: 9.0000 mL | INTRAVENOUS | Status: DC | PRN

## 2016-11-02 MED ORDER — FENTANYL CITRATE (PF) 100 MCG/2ML IJ SOLN
INTRAMUSCULAR | Status: DC | PRN
  Administered 2016-11-02 (×3): 50 ug via INTRAVENOUS
  Administered 2016-11-02: 100 ug via INTRAVENOUS
  Administered 2016-11-02 (×4): 50 ug via INTRAVENOUS

## 2016-11-02 MED ORDER — PROPOFOL 10 MG/ML IV BOLUS
INTRAVENOUS | Status: AC
Start: 2016-11-02 — End: 2016-11-02
  Filled 2016-11-02: qty 20

## 2016-11-02 MED ORDER — PANTOPRAZOLE SODIUM 40 MG IV SOLR
40.0000 mg | Freq: Two times a day (BID) | INTRAVENOUS | Status: DC
  Administered 2016-11-02 – 2016-11-14 (×25): 40 mg via INTRAVENOUS
  Filled 2016-11-02 (×26): qty 40

## 2016-11-02 MED ORDER — SUCCINYLCHOLINE CHLORIDE 20 MG/ML IJ SOLN
INTRAMUSCULAR | Status: DC | PRN
  Administered 2016-11-02: 120 mg via INTRAVENOUS

## 2016-11-02 MED ORDER — ROCURONIUM BROMIDE 100 MG/10ML IV SOLN
INTRAVENOUS | Status: DC | PRN
  Administered 2016-11-02: 45 mg via INTRAVENOUS
  Administered 2016-11-02: 10 mg via INTRAVENOUS
  Administered 2016-11-02: 20 mg via INTRAVENOUS
  Administered 2016-11-02: 5 mg via INTRAVENOUS

## 2016-11-02 MED ORDER — FENTANYL CITRATE (PF) 100 MCG/2ML IJ SOLN
25.0000 ug | INTRAMUSCULAR | Status: DC | PRN
  Administered 2016-11-02 (×4): 25 ug via INTRAVENOUS

## 2016-11-02 MED ORDER — FENTANYL CITRATE (PF) 250 MCG/5ML IJ SOLN
INTRAMUSCULAR | Status: AC
  Filled 2016-11-02: qty 5

## 2016-11-02 MED ORDER — LIDOCAINE HCL (PF) 2 % IJ SOLN
INTRAMUSCULAR | Status: AC
  Filled 2016-11-02: qty 2

## 2016-11-02 MED ORDER — DIPHENHYDRAMINE HCL 12.5 MG/5ML PO ELIX: 12.5000 mg | ORAL_SOLUTION | Freq: Four times a day (QID) | ORAL | Status: DC | PRN

## 2016-11-02 MED ORDER — MORPHINE SULFATE 2 MG/ML IV SOLN: INTRAVENOUS | Status: DC

## 2016-11-02 MED ORDER — ACETAMINOPHEN 10 MG/ML IV SOLN
INTRAVENOUS | Status: AC
  Filled 2016-11-02: qty 100

## 2016-11-02 MED ORDER — NALOXONE HCL 0.4 MG/ML IJ SOLN: 0.4000 mg | INTRAMUSCULAR | Status: DC | PRN

## 2016-11-02 MED ORDER — HYDROMORPHONE HCL 1 MG/ML IJ SOLN
INTRAMUSCULAR | Status: AC
  Administered 2016-11-02: 0.5 mg via INTRAVENOUS
  Filled 2016-11-02: qty 1

## 2016-11-02 MED ORDER — ACETAMINOPHEN 10 MG/ML IV SOLN
INTRAVENOUS | Status: DC | PRN
  Administered 2016-11-02: 1000 mg via INTRAVENOUS

## 2016-11-02 MED ORDER — SUCCINYLCHOLINE CHLORIDE 20 MG/ML IJ SOLN
INTRAMUSCULAR | Status: AC
  Filled 2016-11-02: qty 1

## 2016-11-02 MED ORDER — PROPOFOL 10 MG/ML IV BOLUS
INTRAVENOUS | Status: DC | PRN
  Administered 2016-11-02: 130 mg via INTRAVENOUS

## 2016-11-02 MED ORDER — DIPHENHYDRAMINE HCL 50 MG/ML IJ SOLN
12.5000 mg | Freq: Four times a day (QID) | INTRAMUSCULAR | Status: DC | PRN
  Administered 2016-11-06 – 2016-11-07 (×2): 12.5 mg via INTRAVENOUS
  Filled 2016-11-02 (×2): qty 1

## 2016-11-02 MED ORDER — LIDOCAINE HCL (CARDIAC) 20 MG/ML IV SOLN
INTRAVENOUS | Status: DC | PRN
  Administered 2016-11-02: 100 mg via INTRAVENOUS

## 2016-11-02 SURGICAL SUPPLY — 51 items
CANISTER SUCT 1200ML W/VALVE (MISCELLANEOUS) ×3 IMPLANT
CATH TRAY 16F METER LATEX (MISCELLANEOUS) ×3 IMPLANT
CELL SAVER LIPIGURD (MISCELLANEOUS) IMPLANT
CHLORAPREP W/TINT 26ML (MISCELLANEOUS) ×3 IMPLANT
CLIP TI LARGE 6 (CLIP) IMPLANT
CLIP TI MEDIUM 6 (CLIP) IMPLANT
DRAIN CHANNEL JP 19F (MISCELLANEOUS) ×3 IMPLANT
DRAPE LEGGINS SURG 28X43 STRL (DRAPES) ×3 IMPLANT
DRAPE UNDER BUTTOCK W/FLU (DRAPES) ×3 IMPLANT
ELECT BLADE 6.5 EXT (BLADE) ×3 IMPLANT
ELECT REM PT RETURN 9FT ADLT (ELECTROSURGICAL) ×3
ELECTRODE REM PT RTRN 9FT ADLT (ELECTROSURGICAL) ×1 IMPLANT
EXTRT SYSTEM ALEXIS 14CM (MISCELLANEOUS)
EXTRT SYSTEM ALEXIS 17CM (MISCELLANEOUS)
GAUZE SPONGE 4X4 12PLY STRL (GAUZE/BANDAGES/DRESSINGS) ×3 IMPLANT
GLOVE BIO SURGEON STRL SZ7.5 (GLOVE) ×15 IMPLANT
GLOVE INDICATOR 8.0 STRL GRN (GLOVE) ×15 IMPLANT
GOWN STRL REUS W/ TWL LRG LVL3 (GOWN DISPOSABLE) ×4 IMPLANT
GOWN STRL REUS W/TWL LRG LVL3 (GOWN DISPOSABLE) ×8
KIT OSTOMY DRAINABLE 2.75 STR (WOUND CARE) ×3 IMPLANT
KIT RM TURNOVER STRD PROC AR (KITS) ×3 IMPLANT
LABEL OR SOLS (LABEL) ×3 IMPLANT
LIGASURE IMPACT 36 18CM CVD LR (INSTRUMENTS) ×3 IMPLANT
NEEDLE HYPO 25X1 1.5 SAFETY (NEEDLE) ×3 IMPLANT
NS IRRIG 1000ML POUR BTL (IV SOLUTION) ×3 IMPLANT
PACK BASIN MAJOR ARMC (MISCELLANEOUS) ×3 IMPLANT
PACK COLON CLEAN CLOSURE (MISCELLANEOUS) IMPLANT
RELOAD LINEAR CUT PROX 55 BLUE (ENDOMECHANICALS) IMPLANT
RELOAD PROXIMATE 75MM BLUE (ENDOMECHANICALS) IMPLANT
SEPRAFILM MEMBRANE 5X6 (MISCELLANEOUS) ×3 IMPLANT
SOL PREP PVP 2OZ (MISCELLANEOUS) ×3
SOLUTION PREP PVP 2OZ (MISCELLANEOUS) ×1 IMPLANT
STAPLER CIRCULAR 29MM (STAPLE) IMPLANT
STAPLER CUT CVD 40MM BLUE (STAPLE) ×3 IMPLANT
STAPLER ENDO ILS CVD 18 33 (STAPLE) IMPLANT
STAPLER PROX 25M (MISCELLANEOUS) IMPLANT
STAPLER PROXIMATE 55 BLUE (STAPLE) ×3 IMPLANT
STAPLER PROXIMATE 75MM BLUE (STAPLE) IMPLANT
STAPLER SKIN PROX 35W (STAPLE) ×3 IMPLANT
SUCT RESERVOIR 100CC (MISCELLANEOUS) ×3 IMPLANT
SURGILUBE 2OZ TUBE FLIPTOP (MISCELLANEOUS) ×3 IMPLANT
SUT ETHILON 3-0 (SUTURE) ×3 IMPLANT
SUT PDS AB 1 TP1 96 (SUTURE) ×6 IMPLANT
SUT PROLENE 2 0 SH DA (SUTURE) ×3 IMPLANT
SUT SILK 2 0SH CR/8 30 (SUTURE) ×3 IMPLANT
SUT SILK 3-0 (SUTURE) ×2
SUT SILK 3-0 SH-1 18XCR BRD (SUTURE) ×1
SUT VIC AB 2-0 CT1 27 (SUTURE) ×4
SUT VIC AB 2-0 CT1 TAPERPNT 27 (SUTURE) ×2 IMPLANT
SUTURE SILK 3-0 SH-1 18XCR BRD (SUTURE) ×1 IMPLANT
SYSTEM CONTND EXTRCTN KII BLLN (MISCELLANEOUS) IMPLANT

## 2016-11-02 NOTE — Brief Op Note (Signed)
11/01/2016 - 11/02/2016  2:21 PM  PATIENT:  Kara [name]  66 y.o. female  PRE-OPERATIVE DIAGNOSIS:  Recurrent diverticulitis  POST-OPERATIVE DIAGNOSIS:  Recurrent diverticulitis with abscess  PROCEDURE:  Procedure(s): COLON RESECTION SIGMOID (N/A) COLOSTOMY (N/A)  SURGEON:  Surgeon(s) and Role:    * [name], MD - Primary    * Nestor Lewandowsky, MD - Assisting  PHYSICIAN ASSISTANT:   ASSISTANTS: PA student   ANESTHESIA:   general  EBL:  Total I/O In: 600 [I.V.:600] Out: 400 [Urine:200; Blood:200]  BLOOD ADMINISTERED:none  DRAINS: (19 [place]) Blake drain(s) in the Pelvis   LOCAL MEDICATIONS USED:  NONE  SPECIMEN:  Source of Specimen:  Sigmoid colon  DISPOSITION OF SPECIMEN:  PATHOLOGY  COUNTS:  YES  TOURNIQUET:  * No tourniquets in log *  DICTATION: .Dragon Dictation  PLAN OF CARE: Returned inpatient  PATIENT DISPOSITION:  PACU - hemodynamically stable.   Delay start of Pharmacological VTE agent (>24hrs) due to surgical blood loss or risk of bleeding: no

## 2016-11-02 NOTE — Transfer of Care (Signed)
Immediate Anesthesia Transfer of Care Note  Patient: Kara [name]  Procedure(s) Performed: Procedure(s): COLON RESECTION SIGMOID (N/A) COLOSTOMY (N/A)  Patient Location: PACU  Anesthesia Type:General  Level of Consciousness: sedated and responds to stimulation  Airway & Oxygen Therapy: Patient Spontanous Breathing and Patient connected to face mask oxygen  Post-op Assessment: Report given to RN and Post -op Vital signs reviewed and stable  Post vital signs: Reviewed and stable  Last Vitals:  Vitals:   11/02/16 1435 11/02/16 1436  BP:  (!) 114/52  Pulse:  91  Resp:  (!) 33  Temp: (P) 36.8 C     Last Pain:  Vitals:   11/02/16 1025  TempSrc: Tympanic  PainSc: 8       Patients Stated Pain Goal: 2 (18/29/93 7169)  Complications: No apparent anesthesia complications

## 2016-11-02 NOTE — Progress Notes (Signed)
Patient IV leaking upon transfer and had to be discontinued Patient a poor IV stick and IV team requested to place another IV. Patient has a colostomy, JP drain. Patient still sleeping PCA to be started once IV is started.

## 2016-11-02 NOTE — Anesthesia Procedure Notes (Signed)
Procedure Name: Intubation Performed by: Lance Muss Pre-anesthesia Checklist: Patient identified, Patient being monitored, Timeout performed, Emergency Drugs available and Suction available Patient Re-evaluated:Patient Re-evaluated prior to inductionOxygen Delivery Method: Circle system utilized Preoxygenation: Pre-oxygenation with 100% oxygen Intubation Type: IV induction Ventilation: Mask ventilation without difficulty Laryngoscope Size: Mac and 3 Grade View: Grade I Tube type: Oral Tube size: 7.0 mm Number of attempts: 1 Airway Equipment and Method: Stylet Placement Confirmation: ETT inserted through vocal cords under direct vision,  positive ETCO2 and breath sounds checked- equal and bilateral Secured at: 20 cm Tube secured with: Tape Dental Injury: Teeth and Oropharynx as per pre-operative assessment

## 2016-11-02 NOTE — Anesthesia Post-op Follow-up Note (Cosign Needed)
Anesthesia QCDR form completed.        

## 2016-11-02 NOTE — Op Note (Signed)
Pre-operative Diagnosis: Recurrent sigmoid diverticulitis  Post-operative Diagnosis: Same  Procedure performed: Sigmoid colectomy with Hartman's end colostomy, mobilization of splenic flexure  Surgeon: [name]   Assistants: Dr. Graciela Husbands  Anesthesia: General endotracheal anesthesia  ASA Class: 3  Surgeon: [name], MD FACS  Anesthesia: Gen. with endotracheal tube  Assistant: PA student  Procedure Details  The patient was seen again in the Holding Room. The benefits, complications, treatment options, and expected outcomes were discussed with the patient. The risks of bleeding, infection, recurrence of symptoms, failure to resolve symptoms,  bowel injury, any of which could require further surgery were reviewed with the patient.   The patient was taken to Operating Room, identified as Kara [name] and the procedure verified.  A Time Out was held and the above information confirmed.  Prior to the induction of general anesthesia, antibiotic prophylaxis was administered. VTE prophylaxis was in place. General endotracheal anesthesia was then administered and tolerated well. After the induction, the abdomen was prepped with Chloraprep and the perineum was prepped with Betadine and draped in the sterile fashion. The patient was positioned in the low lithotomy position.  The procedure began with a midline incision made with a 10 blade scalpel. Using accommodation above elect cautery and blunt dissection is taken down to the level of fascia. The fascia was sharply entered into and then using a cautery opened along its entire length. This allowed visualization of the entire abdominal contents. The small bowel and omentum was noted only normal in caliber and appearance. The colon was normal until the sigmoid colon. The sigmoid colon so it had multiple diverticulum and a hard inflammatory mass going to the lateral sidewall. It was attached with dense inflammatory  attachments.  The patient was placed head down and the abdominal contents were draped away from the area of concern. A Balfour retractor was placed which allowed for appropriate visualization. Using accommodation of left cautery and blunt dissection the inflammatory attachments were taken away from the lateral sidewall. A thick rind around and abscess cavity was encountered. The abscess was entered into and drained out. The mesentery was then opened with electrocautery and using LigaSure device was taken down its length freeing up the sigmoid colon. There was an area of bleeding from a large vessel that was controlled with suture ligation on the mid aspect of the sigmoid colon. Once the entirety of the colon was mobile it was excised proximally with a 55 mm blue load GIA stapler. It was excised distally with a blue load contour stapler. The sigmoid colon was then removed and passed off the field as a specimen.  Given the contaminated state of the pelvis the decision was made to perform an end colostomy with a Hartman's pouch. The rectal stump was then tagged with 2 loose 2-0 Prolene sutures. The pelvis was then copiously irrigated with warm normal saline. A 19 [place] Blake drain was placed into the pelvis entering the abdomen through the right lower quadrant.  The remaining colon required mobilization up to and including the splenic flexure to allow for adequate length of the colostomy to reach the abdominal wall. This mobilization was performed with accommodation of electrocautery and blunt dissection. The mesentery also had to be released. An area of bleeding was identified which required 2 figure-of-eight 2-0 silk sutures for hemostasis. An area for the ostomy was chosen in the right left upper quadrant. An elliptical incision was made with cautery. Using cautery was taken to the level of  fascia and the perineal cavity was entered into. The draining colon was able to be easily delivered through the skin  opening after it was mobilized.  At this point the decision was made to close the fascia. The fascia was closed with 2 running loop #1 PDS sutures. The skin of the midline was then closed with surgical staplers after closely irrigating the superficial tissues. We then turned our attention to the ostomy. The ostomy was secured in 4 quadrants to the deeper fascia with interrupted 2-0 silk suture. The staple line was then sharply excised off. Hemostasis was obtained with electrocautery. The ostomy was developed in a Brook fashion with interrupted 2-0 silk sutures. The ostomy was noted to be viable with some evidence of bleeding to the mucosal edges. Hard stool was delivered from within the colon. An ostomy appliance was then brought up to the field and cut to fit the ostomy opening and secured to the abdominal wall. Entire abdomen was cleaned with a wet to dry lap sponge. A sterile dressing of a honeycomb dressing was placed over the midline. Ostomy bag was placed to the ostomy appliance. A drain sponge was placed around the right lower quadrant pain.  The patient tolerated the procedure well. She was awoken from general endotracheal anesthesia and transferred to the PACU in good condition.  Findings: Recurrent sigmoid diverticulitis with pelvic abscess.   Estimated Blood Loss: 200 mL         Drains: 19 French round Blake drain into the pelvis         Specimens: Sigmoid colon          Complications: None                  Condition: Good   [name], MD, FACS

## 2016-11-02 NOTE — OR Nursing (Signed)
Dr Billie Lade notified that patient has been continuing to take asprin and Heparin preop with at does of both this am.  No new orders will proceed with surgery.

## 2016-11-02 NOTE — Anesthesia Postprocedure Evaluation (Signed)
Anesthesia Post Note  Patient: Kara [name]  Procedure(s) Performed: Procedure(s) (LRB): COLON RESECTION SIGMOID (N/A) COLOSTOMY (N/A)  Patient location during evaluation: PACU Anesthesia Type: General Level of consciousness: awake and alert Pain management: pain level controlled Vital Signs Assessment: post-procedure vital signs reviewed and stable Respiratory status: spontaneous breathing, nonlabored ventilation, respiratory function stable and patient connected to nasal cannula oxygen Cardiovascular status: blood pressure returned to baseline and stable Postop Assessment: no signs of nausea or vomiting Anesthetic complications: no     Last Vitals:  Vitals:   11/02/16 1520 11/02/16 1535  BP: (!) 119/53 (!) 118/53  Pulse: 93 91  Resp: (!) 27 (!) 27  Temp:  36.3 C    Last Pain:  Vitals:   11/02/16 1535  TempSrc:   PainSc: 9                  Martha Clan

## 2016-11-02 NOTE — Progress Notes (Signed)
CC: Diverticulitis  Subjective: Patient reports continued and mildly worsening pain overnight.   Objective: Vital signs in last 24 hours: Temp:  [98 F (36.7 C)-99.2 F (37.3 C)] 98.6 F (37 C) (06/12 0452) Pulse Rate:  [84-96] 96 (06/12 0452) Resp:  [18-20] 20 (06/12 0452) BP: (109-123)/(49-59) 123/59 (06/12 0452) SpO2:  [93 %-97 %] 93 % (06/12 0452) Last BM Date: 10/31/16  Intake/Output from previous day: 06/11 0701 - 06/12 0700 In: 3358.4 [I.V.:3158.4; IV Piggyback:200] Out: 1600 [Urine:1600] Intake/Output this shift: No intake/output data recorded.  Physical exam:  Gen: NAD Resp: CTA CV: RRR ABD: Soft, TTP in LLQ, mildly distended  Lab Results: CBC   Recent Labs  11/01/16 0501 11/02/16 0449  WBC 11.3* 7.2  HGB 13.2 13.3  HCT 38.5 38.4  PLT 227 233   BMET  Recent Labs  11/01/16 0501 11/02/16 0449  NA 136 137  K 3.8 3.8  CL 106 104  CO2 25 28  GLUCOSE 216* 190*  BUN 10 6  CREATININE 0.77 0.49  CALCIUM 8.0* 8.3*   PT/INR No results for input(s): LABPROT, INR in the last 72 hours. ABG No results for input(s): PHART, HCO3 in the last 72 hours.  Invalid input(s): PCO2, PO2  Studies/Results: Ct Abdomen Pelvis W Contrast  Result Date: 11/01/2016 CLINICAL DATA:  Mid abdominal pain across the navel EXAM: CT ABDOMEN AND PELVIS WITH CONTRAST TECHNIQUE: Multidetector CT imaging of the abdomen and pelvis was performed using the standard protocol following bolus administration of intravenous contrast. CONTRAST:  169mL ISOVUE-300 IOPAMIDOL (ISOVUE-300) INJECTION 61% COMPARISON:  10/03/2016 FINDINGS: Lower chest: Lung bases demonstrate no acute consolidation or pleural effusion. The heart is nonenlarged. Hepatobiliary: Hepatic steatosis. Small stones in the gallbladder. No biliary dilatation Pancreas: Unremarkable. No pancreatic ductal dilatation or surrounding inflammatory changes. Spleen: Normal in size without focal abnormality. Adrenals/Urinary Tract: Adrenal  glands are unremarkable. Kidneys are normal, without renal calculi, focal lesion, or hydronephrosis. Bladder is unremarkable. Stomach/Bowel: The stomach is nonenlarged. There is no evidence for bowel obstruction. Extensive sigmoid colon diverticular disease. Hazy fat stranding around the sigmoid colon with fluid and gas collections adjacent to the sigmoid colon, measuring 4.1 x 2.4 cm. Vascular/Lymphatic: Aortic atherosclerosis. Small lymph node anterior to the IVC measuring 6 mm. No significantly enlarged pelvic lymph nodes Reproductive: Uterus unremarkable. Left adnexal thickening adjacent to the sigmoid colon inflammatory changes. No adnexal mass Other: No free air. Musculoskeletal: Grade 1 anterolisthesis of L4 on L5. Degenerative changes at multiple levels. IMPRESSION: 1. Sigmoid colon diverticular disease with moderate inflammation around the sigmoid colon consistent with diverticulitis. Fluid and gas collection adjacent to the sigmoid colon consistent with localized perforation, uncertain if this represents residual of the previously noted perforation on the CT 1 month ago or if this represents repeat contained perforation. No drainable abscess visualized at this time. 2. Gallstones 3. Hepatic steatosis Electronically Signed   By: Donavan Foil M.D.   On: 11/01/2016 02:40    Anti-infectives: Anti-infectives    Start     Dose/Rate Route Frequency Ordered Stop   11/01/16 1600  ciprofloxacin (CIPRO) IVPB 400 mg     400 mg 200 mL/hr over 60 Minutes Intravenous Every 12 hours 11/01/16 1208     11/01/16 1300  metroNIDAZOLE (FLAGYL) IVPB 500 mg     500 mg 100 mL/hr over 60 Minutes Intravenous Every 8 hours 11/01/16 1208     11/01/16 0315  ciprofloxacin (CIPRO) IVPB 400 mg     400 mg 200 mL/hr over 60  Minutes Intravenous  Once 11/01/16 0311 11/01/16 0450   11/01/16 0315  metroNIDAZOLE (FLAGYL) IVPB 500 mg  Status:  Discontinued     500 mg 100 mL/hr over 60 Minutes Intravenous  Once 11/01/16 9629  11/01/16 1208      Assessment/Plan:  66 year old female with recurrent diverticulitis. Is failing antibiotic therapy. Discussed surgery again and the patient desires to proceed. The procedure of a sigmoid colectomy with possible ostomy was discussed in detail. The patient voiced understanding and desires to proceed. Will go to OR later this morning.  Anedra Penafiel T. Adonis Huguenin, MD, [facility] General Surgeon [facility]  Day [facility] [phone] Night [facility] [phone] 11/02/2016

## 2016-11-02 NOTE — Anesthesia Preprocedure Evaluation (Addendum)
Anesthesia Evaluation  Patient identified by MRN, date of birth, ID band Patient awake    Reviewed: Allergy & Precautions, NPO status , Patient's Chart, lab work & pertinent test results  Airway Mallampati: II  TM Distance: <3 FB     Dental  (+) Chipped   Pulmonary neg pulmonary ROS,    Pulmonary exam normal        Cardiovascular hypertension, Pt. on medications Normal cardiovascular exam     Neuro/Psych negative neurological ROS  negative psych ROS   GI/Hepatic Neg liver ROS, diverticulitis   Endo/Other  diabetes, Well Controlled, Type 2, Oral Hypoglycemic Agents  Renal/GU negative Renal ROS  negative genitourinary   Musculoskeletal  (+) Arthritis , Osteoarthritis,    Abdominal (+) + obese,   Peds negative pediatric ROS (+)  Hematology   Anesthesia Other Findings Past Medical History: No date: Diabetes mellitus without complication (HCC) No date: Hypertension  Small mouth  Reproductive/Obstetrics                            Anesthesia Physical Anesthesia Plan  ASA: III  Anesthesia Plan: General   Post-op Pain Management:    Induction: Intravenous, Rapid sequence and Cricoid pressure planned  PONV Risk Score and Plan: 4 or greater and Ondansetron, Dexamethasone, Propofol, Midazolam, Scopolamine patch - Pre-op and Treatment may vary due to age or medical condition  Airway Management Planned: Oral ETT  Additional Equipment:   Intra-op Plan:   Post-operative Plan: Extubation in OR  Informed Consent:   Plan Discussed with: CRNA and Surgeon  Anesthesia Plan Comments:        Anesthesia Quick Evaluation

## 2016-11-03 ENCOUNTER — Encounter: Payer: Self-pay | Admitting: General Surgery

## 2016-11-03 LAB — CBC
HEMATOCRIT: 38.9 % (ref 35.0–47.0)
Hemoglobin: 13.3 g/dL (ref 12.0–16.0)
MCH: 31.2 pg (ref 26.0–34.0)
MCHC: 34 g/dL (ref 32.0–36.0)
MCV: 91.6 fL (ref 80.0–100.0)
PLATELETS: 256 10*3/uL (ref 150–440)
RBC: 4.25 MIL/uL (ref 3.80–5.20)
RDW: 13.1 % (ref 11.5–14.5)
WBC: 10.9 10*3/uL (ref 3.6–11.0)

## 2016-11-03 LAB — BASIC METABOLIC PANEL
ANION GAP: 6 (ref 5–15)
BUN: 8 mg/dL (ref 6–20)
CALCIUM: 8.1 mg/dL — AB (ref 8.9–10.3)
CO2: 27 mmol/L (ref 22–32)
Chloride: 106 mmol/L (ref 101–111)
Creatinine, Ser: 0.58 mg/dL (ref 0.44–1.00)
Glucose, Bld: 214 mg/dL — ABNORMAL HIGH (ref 65–99)
POTASSIUM: 3.7 mmol/L (ref 3.5–5.1)
Sodium: 139 mmol/L (ref 135–145)

## 2016-11-03 LAB — GLUCOSE, CAPILLARY
GLUCOSE-CAPILLARY: 175 mg/dL — AB (ref 65–99)
Glucose-Capillary: 192 mg/dL — ABNORMAL HIGH (ref 65–99)
Glucose-Capillary: 219 mg/dL — ABNORMAL HIGH (ref 65–99)
Glucose-Capillary: 158 mg/dL — ABNORMAL HIGH (ref 65–99)

## 2016-11-03 MED ORDER — PROMETHAZINE HCL 25 MG/ML IJ SOLN
12.5000 mg | Freq: Four times a day (QID) | INTRAMUSCULAR | Status: DC | PRN
  Administered 2016-11-03 – 2016-11-12 (×12): 12.5 mg via INTRAVENOUS
  Filled 2016-11-03 (×13): qty 1

## 2016-11-03 MED ORDER — ALUM & MAG HYDROXIDE-SIMETH 200-200-20 MG/5ML PO SUSP
15.0000 mL | Freq: Four times a day (QID) | ORAL | Status: DC | PRN
  Administered 2016-11-03 – 2016-11-12 (×2): 15 mL via ORAL
  Filled 2016-11-03 (×2): qty 30

## 2016-11-03 MED ORDER — KETOROLAC TROMETHAMINE 15 MG/ML IJ SOLN
15.0000 mg | Freq: Four times a day (QID) | INTRAMUSCULAR | Status: AC
  Administered 2016-11-03 – 2016-11-08 (×20): 15 mg via INTRAVENOUS
  Filled 2016-11-03 (×20): qty 1

## 2016-11-03 MED ORDER — CYCLOBENZAPRINE HCL 10 MG PO TABS
5.0000 mg | ORAL_TABLET | Freq: Three times a day (TID) | ORAL | Status: DC | PRN
  Administered 2016-11-03: 5 mg via ORAL
  Filled 2016-11-03 (×2): qty 1

## 2016-11-03 NOTE — Progress Notes (Signed)
1 Day Post-Op   Subjective:  A now the first postoperative day from an open sigmoid colectomy. Reports she has significant pain overnight. She gets some relief from her PCA but continues to have breakthrough pain. Denies any nausea or vomiting.  Vital signs in last 24 hours: Temp:  [97.4 F (36.3 C)-98.7 F (37.1 C)] 98.1 F (36.7 C) (06/13 0821) Pulse Rate:  [88-108] 103 (06/13 0821) Resp:  [18-34] 28 (06/13 0821) BP: (100-138)/(49-70) 117/50 (06/13 0821) SpO2:  [91 %-97 %] 92 % (06/13 0821) Last BM Date: 11/01/16  Intake/Output from previous day: 06/12 0701 - 06/13 0700 In: 2651 [I.V.:2251; IV Piggyback:400] Out: 1930 [Urine:1600; Drains:100; Stool:30; Blood:200]  GI: Abdomen soft, nondistended, appropriately tender to palpation midline. Midline staples in place without any significant drainage. Honeycomb dressing still in place. JP drain in the right lower quadrant with serous and was fluid. Colostomy present in the left upper quadrant that is productive of fluid with some stool output reported from yesterday. Very dark and dusky tissue present at the skin level.  Lab Results:  CBC  Recent Labs  11/02/16 0449 11/03/16 0736  WBC 7.2 10.9  HGB 13.3 13.3  HCT 38.4 38.9  PLT 233 256   CMP     Component Value Date/Time   NA 139 11/03/2016 0736   NA 139 05/30/2013 0520   K 3.7 11/03/2016 0736   K 3.6 05/30/2013 0520   CL 106 11/03/2016 0736   CL 108 (H) 05/30/2013 0520   CO2 27 11/03/2016 0736   CO2 27 05/30/2013 0520   GLUCOSE 214 (H) 11/03/2016 0736   GLUCOSE 98 05/30/2013 0520   BUN 8 11/03/2016 0736   BUN 11 05/30/2013 0520   CREATININE 0.58 11/03/2016 0736   CREATININE 0.90 05/30/2013 0520   CALCIUM 8.1 (L) 11/03/2016 0736   CALCIUM 7.9 (L) 05/30/2013 0520   PROT 6.5 11/01/2016 0501   PROT 8.2 05/29/2013 0631   ALBUMIN 3.2 (L) 11/01/2016 0501   ALBUMIN 3.8 05/29/2013 0631   AST 32 11/01/2016 0501   AST 33 05/29/2013 0631   ALT 38 11/01/2016 0501   ALT 54  05/29/2013 0631   ALKPHOS 47 11/01/2016 0501   ALKPHOS 64 05/29/2013 0631   BILITOT 0.7 11/01/2016 0501   BILITOT 0.5 05/29/2013 0631   GFRNONAA >60 11/03/2016 0736   GFRNONAA >60 05/30/2013 0520   GFRAA >60 11/03/2016 0736   GFRAA >60 05/30/2013 0520   PT/INR No results for input(s): LABPROT, INR in the last 72 hours.  Studies/Results: No results found.  Assessment/Plan: 66 year old female status post sigmoid colectomy. Continues to have pain issues. Plan to start scheduled Toradol. Colostomy is very dark and dusky. Although it appears be functioning I will watch her very closely. Encourage ambulation, incentive spirometer usage. Keep Foley catheter today until patient is able to move better to allow for voiding.   [name], MD [facility]  Day [facility] [phone] Night [facility] [phone]  11/03/2016

## 2016-11-04 LAB — CBC
HCT: 37.1 % (ref 35.0–47.0)
Hemoglobin: 12.5 g/dL (ref 12.0–16.0)
MCH: 30.6 pg (ref 26.0–34.0)
MCHC: 33.6 g/dL (ref 32.0–36.0)
MCV: 90.9 fL (ref 80.0–100.0)
PLATELETS: 299 10*3/uL (ref 150–440)
RBC: 4.08 MIL/uL (ref 3.80–5.20)
RDW: 13.1 % (ref 11.5–14.5)
WBC: 8.5 10*3/uL (ref 3.6–11.0)

## 2016-11-04 LAB — PHOSPHORUS: PHOSPHORUS: 3.5 mg/dL (ref 2.5–4.6)

## 2016-11-04 LAB — GLUCOSE, CAPILLARY
GLUCOSE-CAPILLARY: 162 mg/dL — AB (ref 65–99)
Glucose-Capillary: 172 mg/dL — ABNORMAL HIGH (ref 65–99)
Glucose-Capillary: 176 mg/dL — ABNORMAL HIGH (ref 65–99)
Glucose-Capillary: 123 mg/dL — ABNORMAL HIGH (ref 65–99)

## 2016-11-04 LAB — BASIC METABOLIC PANEL
Anion gap: 5 (ref 5–15)
BUN: 14 mg/dL (ref 6–20)
CO2: 28 mmol/L (ref 22–32)
CREATININE: 0.71 mg/dL (ref 0.44–1.00)
Calcium: 8 mg/dL — ABNORMAL LOW (ref 8.9–10.3)
Chloride: 102 mmol/L (ref 101–111)
Glucose, Bld: 193 mg/dL — ABNORMAL HIGH (ref 65–99)
POTASSIUM: 3.7 mmol/L (ref 3.5–5.1)
SODIUM: 135 mmol/L (ref 135–145)

## 2016-11-04 LAB — MAGNESIUM: MAGNESIUM: 1.9 mg/dL (ref 1.7–2.4)

## 2016-11-04 LAB — SURGICAL PATHOLOGY

## 2016-11-04 MED ORDER — MUPIROCIN 2 % EX OINT
1.0000 "application " | TOPICAL_OINTMENT | Freq: Two times a day (BID) | CUTANEOUS | Status: AC
  Administered 2016-11-04 – 2016-11-08 (×10): 1 via NASAL
  Filled 2016-11-04: qty 22

## 2016-11-04 MED ORDER — CHLORHEXIDINE GLUCONATE CLOTH 2 % EX PADS
6.0000 | MEDICATED_PAD | Freq: Every day | CUTANEOUS | Status: DC
Start: 2016-11-04 — End: 2016-11-07
  Administered 2016-11-04 – 2016-11-07 (×4): 6 via TOPICAL

## 2016-11-04 NOTE — Care Management (Addendum)
Post op day 2- Met with patient at bedside.  Briefly discussed CM role regarding discharge disposition and ostomy management education. Current oxygen requirement is acute.   Made NPO this afternoon due to nausea and vomiting.   She says "right now I am too sick to even think about all this."  Acknowledges she has family support in the home

## 2016-11-04 NOTE — Progress Notes (Signed)
2 Days Post-Op   Subjective:  Patient reports doing somewhat better this morning. Having some nausea that is relieved with when necessary medications. Pain much better controlled. She reports she is still using her PCA. She is tolerating her liquids but does not want to advance yet. She is still having difficulty transferring out of bed but she has been time in the chair.  Vital signs in last 24 hours: Temp:  [98 F (36.7 C)-98.9 F (37.2 C)] 98.4 F (36.9 C) (06/14 0544) Pulse Rate:  [93-108] 99 (06/14 0544) Resp:  [19-32] 22 (06/14 0800) BP: (114-136)/(51-79) 126/51 (06/14 0544) SpO2:  [90 %-94 %] 93 % (06/14 0800) Last BM Date: 11/03/16  Intake/Output from previous day: 06/13 0701 - 06/14 0700 In: 3107.4 [I.V.:2907.4; IV Piggyback:200] Out: 2951 [Urine:1475; Drains:80]  GI: Abdomen is soft, appropriately tender to palpation at her surgical sites, nondistended. Right lower quadrant JP drain with serosanguineous output. Left upper quadrant colostomy in place that is dusky and dark at the level but has visible pink mucosa upon digital exam. Productive of liquid at this point.  Lab Results:  CBC  Recent Labs  11/03/16 0736 11/04/16 0524  WBC 10.9 8.5  HGB 13.3 12.5  HCT 38.9 37.1  PLT 256 299   CMP     Component Value Date/Time   NA 135 11/04/2016 0524   NA 139 05/30/2013 0520   K 3.7 11/04/2016 0524   K 3.6 05/30/2013 0520   CL 102 11/04/2016 0524   CL 108 (H) 05/30/2013 0520   CO2 28 11/04/2016 0524   CO2 27 05/30/2013 0520   GLUCOSE 193 (H) 11/04/2016 0524   GLUCOSE 98 05/30/2013 0520   BUN 14 11/04/2016 0524   BUN 11 05/30/2013 0520   CREATININE 0.71 11/04/2016 0524   CREATININE 0.90 05/30/2013 0520   CALCIUM 8.0 (L) 11/04/2016 0524   CALCIUM 7.9 (L) 05/30/2013 0520   PROT 6.5 11/01/2016 0501   PROT 8.2 05/29/2013 0631   ALBUMIN 3.2 (L) 11/01/2016 0501   ALBUMIN 3.8 05/29/2013 0631   AST 32 11/01/2016 0501   AST 33 05/29/2013 0631   ALT 38 11/01/2016  0501   ALT 54 05/29/2013 0631   ALKPHOS 47 11/01/2016 0501   ALKPHOS 64 05/29/2013 0631   BILITOT 0.7 11/01/2016 0501   BILITOT 0.5 05/29/2013 0631   GFRNONAA >60 11/04/2016 0524   GFRNONAA >60 05/30/2013 0520   GFRAA >60 11/04/2016 0524   GFRAA >60 05/30/2013 0520   PT/INR No results for input(s): LABPROT, INR in the last 72 hours.  Studies/Results: No results found.  Assessment/Plan: 66 year old female postop day #2 from a sigmoid colectomy with end colostomy. Awaiting return of bowel function. Encouraging ambulation and incentive spirometer usage. Discussed with the patient the importance of removing the Foley catheter and after long discussion we agreed for it. Removed tomorrow morning. We will continue her PCA until tomorrow as well. The colostomy site is looking much better today than yesterday although is still dusky at the skin level. We will follow this closely. Continue antibiotics for a total of a 7 day course. She is on day 4 of 7 today.   [name], MD [facility]  Day [facility] [phone] Night [facility] [phone]  11/04/2016

## 2016-11-04 NOTE — Progress Notes (Signed)
Okay per Dr. Adonis Huguenin to go ahead and give zofran early as pt is still nauseous.

## 2016-11-04 NOTE — Progress Notes (Signed)
Notified Dr. Adonis Huguenin that pt had emesis of 481ml and has been nauseous all day. Per MD make pt NPO except ice chips and pt may need NG tube if vomits again.

## 2016-11-05 ENCOUNTER — Inpatient Hospital Stay: Payer: Managed Care, Other (non HMO)

## 2016-11-05 LAB — BASIC METABOLIC PANEL
ANION GAP: 7 (ref 5–15)
BUN: 20 mg/dL (ref 6–20)
CO2: 27 mmol/L (ref 22–32)
Calcium: 8.1 mg/dL — ABNORMAL LOW (ref 8.9–10.3)
Chloride: 103 mmol/L (ref 101–111)
Creatinine, Ser: 0.73 mg/dL (ref 0.44–1.00)
GLUCOSE: 183 mg/dL — AB (ref 65–99)
POTASSIUM: 3.7 mmol/L (ref 3.5–5.1)
Sodium: 137 mmol/L (ref 135–145)

## 2016-11-05 LAB — MAGNESIUM: MAGNESIUM: 2.1 mg/dL (ref 1.7–2.4)

## 2016-11-05 LAB — CBC
HCT: 35.6 % (ref 35.0–47.0)
Hemoglobin: 12.4 g/dL (ref 12.0–16.0)
MCH: 31.4 pg (ref 26.0–34.0)
MCHC: 34.9 g/dL (ref 32.0–36.0)
MCV: 90.2 fL (ref 80.0–100.0)
PLATELETS: 297 10*3/uL (ref 150–440)
RBC: 3.95 MIL/uL (ref 3.80–5.20)
RDW: 13 % (ref 11.5–14.5)
WBC: 5 10*3/uL (ref 3.6–11.0)

## 2016-11-05 LAB — GLUCOSE, CAPILLARY
GLUCOSE-CAPILLARY: 175 mg/dL — AB (ref 65–99)
GLUCOSE-CAPILLARY: 132 mg/dL — AB (ref 65–99)
Glucose-Capillary: 136 mg/dL — ABNORMAL HIGH (ref 65–99)

## 2016-11-05 LAB — PHOSPHORUS: PHOSPHORUS: 3.3 mg/dL (ref 2.5–4.6)

## 2016-11-05 NOTE — Care Management (Signed)
Barriers: post op ileus, NG, PCA

## 2016-11-05 NOTE — Progress Notes (Signed)
3 Days Post-Op   Subjective:  Patient has had continued nausea since yesterday afternoon. Through one time yesterday. Otherwise states that her pain is doing okay. Continues on PCA.  Vital signs in last 24 hours: Temp:  [97.9 F (36.6 C)-98.6 F (37 C)] 98.6 F (37 C) (06/15 0428) Pulse Rate:  [89-100] 100 (06/15 0428) Resp:  [18-32] 20 (06/15 0800) BP: (123-127)/(53-65) 123/62 (06/15 0428) SpO2:  [91 %-94 %] 92 % (06/15 0800) Last BM Date: 11/03/16  Intake/Output from previous day: 06/14 0701 - 06/15 0700 In: 2801 [I.V.:2601; IV Piggyback:200] Out: 1027 [Urine:1325; Emesis/NG output:400; Drains:120]  GI: Abdomen is large, tender to palpation in the midline especially the upper midline, mildly distended in the proximal upper abdomen. Ostomy present left upper quadrant is dark but intact. Having some liquid output.  Lab Results:  CBC  Recent Labs  11/04/16 0524 11/05/16 0808  WBC 8.5 5.0  HGB 12.5 12.4  HCT 37.1 35.6  PLT 299 297   CMP     Component Value Date/Time   NA 137 11/05/2016 0808   NA 139 05/30/2013 0520   K 3.7 11/05/2016 0808   K 3.6 05/30/2013 0520   CL 103 11/05/2016 0808   CL 108 (H) 05/30/2013 0520   CO2 27 11/05/2016 0808   CO2 27 05/30/2013 0520   GLUCOSE 183 (H) 11/05/2016 0808   GLUCOSE 98 05/30/2013 0520   BUN 20 11/05/2016 0808   BUN 11 05/30/2013 0520   CREATININE 0.73 11/05/2016 0808   CREATININE 0.90 05/30/2013 0520   CALCIUM 8.1 (L) 11/05/2016 0808   CALCIUM 7.9 (L) 05/30/2013 0520   PROT 6.5 11/01/2016 0501   PROT 8.2 05/29/2013 0631   ALBUMIN 3.2 (L) 11/01/2016 0501   ALBUMIN 3.8 05/29/2013 0631   AST 32 11/01/2016 0501   AST 33 05/29/2013 0631   ALT 38 11/01/2016 0501   ALT 54 05/29/2013 0631   ALKPHOS 47 11/01/2016 0501   ALKPHOS 64 05/29/2013 0631   BILITOT 0.7 11/01/2016 0501   BILITOT 0.5 05/29/2013 0631   GFRNONAA >60 11/05/2016 0808   GFRNONAA >60 05/30/2013 0520   GFRAA >60 11/05/2016 0808   GFRAA >60 05/30/2013  0520   PT/INR No results for input(s): LABPROT, INR in the last 72 hours.  Studies/Results: Dg Abd 1 View  Result Date: 11/05/2016 CLINICAL DATA:  Nasogastric tube placement. EXAM: ABDOMEN - 1 VIEW COMPARISON:  11/01/2016 CT FINDINGS: Nasogastric tube is in place, tip overlying the level of the distal stomach. Surgical clips overlie the lower central abdomen. A tube is coiled over the lower pelvis, incompletely evaluated. Visualized bowel gas pattern is nonobstructive. No evidence for free intraperitoneal air. IMPRESSION: Nasogastric tube tip overlying the level the distal stomach. Electronically Signed   By: Nolon Nations M.D.   On: 11/05/2016 10:35    Assessment/Plan: 66 year old female status post sigmoid colectomy and drainage of pelvic abscess for diverticulitis. Appears to be developing a postoperative ileus. NG tube to be placed today for symptomatic relief and to keep her from having emesis. Continue to encourage patient to get out of bed. Continue Foley catheter for 1 additional day due to the development of an ileus.   [name], MD [facility]  Day [facility] [phone] Night [facility] [phone]  11/05/2016

## 2016-11-05 NOTE — Progress Notes (Signed)
Per Dr. Adonis Huguenin okay to place order for NG tube. Also okay to place order for verification of NG tube with abdominal x-ray. Per MD hold morning meds.

## 2016-11-05 NOTE — Plan of Care (Signed)
Problem: Activity: Goal: Ability to tolerate increased activity will improve Outcome: Not Progressing Pt refuses OOB for this shift.

## 2016-11-06 LAB — GLUCOSE, CAPILLARY
GLUCOSE-CAPILLARY: 142 mg/dL — AB (ref 65–99)
GLUCOSE-CAPILLARY: 136 mg/dL — AB (ref 65–99)
Glucose-Capillary: 129 mg/dL — ABNORMAL HIGH (ref 65–99)
Glucose-Capillary: 124 mg/dL — ABNORMAL HIGH (ref 65–99)

## 2016-11-06 MED ORDER — DIPHENHYDRAMINE HCL 50 MG/ML IJ SOLN: 12.5000 mg | Freq: Four times a day (QID) | INTRAMUSCULAR | Status: DC | PRN

## 2016-11-06 NOTE — Progress Notes (Signed)
NGT clamp output 400cc's of dark greenish secretions, No c'o of nausea or vomiting at this time.

## 2016-11-06 NOTE — Progress Notes (Signed)
4 Days Post-Op   Subjective:  66 year old female now 4 days status post sigmoid colectomy for ruptured diverticulitis. States she is feeling better today. Pain is currently well controlled. She denies any nausea. States she wants to get up and move more. Her primary complaint is of the NG tube.  Vital signs in last 24 hours: Temp:  [97.8 F (36.6 C)-98.9 F (37.2 C)] 97.8 F (36.6 C) (06/16 0441) Pulse Rate:  [93-96] 96 (06/16 0441) Resp:  [17-31] 18 (06/16 0853) BP: (118-124)/(55-63) 124/55 (06/16 0441) SpO2:  [91 %-97 %] 95 % (06/16 0853) Last BM Date: 11/03/16  Intake/Output from previous day: 06/15 0701 - 06/16 0700 In: 2479 [I.V.:2119; NG/GT:100] Out: 1960 [Urine:850; Emesis/NG output:950; Drains:110; Stool:50]  GI: Abdomen is very large, soft, nontender, nondistended. Midline staples are well approximated without evidence of erythema or drainage. JP drain in the right lower quadrant with serosanguineous output. Colostomy in left upper quadrant with a black, necrotic-appearing superficial layer. It is digitally examined and the deeper tissues palpated and appeared to be intact. It is productive of liquid currently.  Lab Results:  CBC  Recent Labs  11/04/16 0524 11/05/16 0808  WBC 8.5 5.0  HGB 12.5 12.4  HCT 37.1 35.6  PLT 299 297   CMP     Component Value Date/Time   NA 137 11/05/2016 0808   NA 139 05/30/2013 0520   K 3.7 11/05/2016 0808   K 3.6 05/30/2013 0520   CL 103 11/05/2016 0808   CL 108 (H) 05/30/2013 0520   CO2 27 11/05/2016 0808   CO2 27 05/30/2013 0520   GLUCOSE 183 (H) 11/05/2016 0808   GLUCOSE 98 05/30/2013 0520   BUN 20 11/05/2016 0808   BUN 11 05/30/2013 0520   CREATININE 0.73 11/05/2016 0808   CREATININE 0.90 05/30/2013 0520   CALCIUM 8.1 (L) 11/05/2016 0808   CALCIUM 7.9 (L) 05/30/2013 0520   PROT 6.5 11/01/2016 0501   PROT 8.2 05/29/2013 0631   ALBUMIN 3.2 (L) 11/01/2016 0501   ALBUMIN 3.8 05/29/2013 0631   AST 32 11/01/2016 0501   AST  33 05/29/2013 0631   ALT 38 11/01/2016 0501   ALT 54 05/29/2013 0631   ALKPHOS 47 11/01/2016 0501   ALKPHOS 64 05/29/2013 0631   BILITOT 0.7 11/01/2016 0501   BILITOT 0.5 05/29/2013 0631   GFRNONAA >60 11/05/2016 0808   GFRNONAA >60 05/30/2013 0520   GFRAA >60 11/05/2016 0808   GFRAA >60 05/30/2013 0520   PT/INR No results for input(s): LABPROT, INR in the last 72 hours.  Studies/Results: Dg Abd 1 View  Result Date: 11/05/2016 CLINICAL DATA:  Nasogastric tube placement. EXAM: ABDOMEN - 1 VIEW COMPARISON:  11/01/2016 CT FINDINGS: Nasogastric tube is in place, tip overlying the level of the distal stomach. Surgical clips overlie the lower central abdomen. A tube is coiled over the lower pelvis, incompletely evaluated. Visualized bowel gas pattern is nonobstructive. No evidence for free intraperitoneal air. IMPRESSION: Nasogastric tube tip overlying the level the distal stomach. Electronically Signed   By: Nolon Nations M.D.   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 Clinically much better today. Plan to start an NG tube clamp trial. If she passes this trial the NG tube can be removed. Discussed that should she be able to get out of bed and move better the Foley catheter would be removed today. Midline wound appears to be healing well. JP drain with continued  output that is serosanguineous. In colostomy with unfortunate necrosis of the superficial aspect of the ostomy that appears to be intact deeper. Awaiting solid stool output. Encourage ambulation, incentive spirometer usage. If tolerates removal of the NG tube later today would restart clear liquid diet tomorrow.   [name], MD [facility]  Day [facility] [phone] Night [facility] [phone]  11/06/2016

## 2016-11-07 LAB — BASIC METABOLIC PANEL
Anion gap: 6 (ref 5–15)
BUN: 14 mg/dL (ref 6–20)
CO2: 28 mmol/L (ref 22–32)
Calcium: 7.5 mg/dL — ABNORMAL LOW (ref 8.9–10.3)
Chloride: 101 mmol/L (ref 101–111)
Creatinine, Ser: 0.7 mg/dL (ref 0.44–1.00)
GFR calc Af Amer: >60 mL/min (ref >60)
GLUCOSE: 148 mg/dL — AB (ref 65–99)
POTASSIUM: 3.5 mmol/L (ref 3.5–5.1)
Sodium: 135 mmol/L (ref 135–145)

## 2016-11-07 LAB — GLUCOSE, CAPILLARY
GLUCOSE-CAPILLARY: 139 mg/dL — AB (ref 65–99)
GLUCOSE-CAPILLARY: 114 mg/dL — AB (ref 65–99)
GLUCOSE-CAPILLARY: 106 mg/dL — AB (ref 65–99)
Glucose-Capillary: 132 mg/dL — ABNORMAL HIGH (ref 65–99)

## 2016-11-07 LAB — CBC
HEMATOCRIT: 34.7 % — AB (ref 35.0–47.0)
Hemoglobin: 12 g/dL (ref 12.0–16.0)
MCH: 31.2 pg (ref 26.0–34.0)
MCHC: 34.7 g/dL (ref 32.0–36.0)
MCV: 90 fL (ref 80.0–100.0)
Platelets: 236 10*3/uL (ref 150–440)
RBC: 3.86 MIL/uL (ref 3.80–5.20)
RDW: 12.8 % (ref 11.5–14.5)
WBC: 6.2 10*3/uL (ref 3.6–11.0)

## 2016-11-07 LAB — MAGNESIUM: Magnesium: 1.8 mg/dL (ref 1.7–2.4)

## 2016-11-07 LAB — PHOSPHORUS: Phosphorus: 3.3 mg/dL (ref 2.5–4.6)

## 2016-11-07 MED ORDER — HYDROXYZINE HCL 10 MG/5ML PO SYRP
25.0000 mg | ORAL_SOLUTION | Freq: Three times a day (TID) | ORAL | Status: DC | PRN
  Administered 2016-11-07: 25 mg via ORAL
  Filled 2016-11-07 (×2): qty 12.5

## 2016-11-07 NOTE — Progress Notes (Signed)
NGT, Foley, and PCA discontinued per MD order.

## 2016-11-07 NOTE — Progress Notes (Signed)
Pt developed wheals and itching after CHG bath given. Skin rinsed, benedryl given, pt reports relief.

## 2016-11-07 NOTE — Progress Notes (Signed)
5 Days Post-Op   Subjective:  Patient reports she continues to feel better and better. For some reason her NG tube was taken off of suction overnight however patient has remained without symptoms. Minimal output the NG was returned to suction. She thinks she is passing more flatus into her bag. She is no longer requiring much use of her PCA. States she started to get hungry.  Vital signs in last 24 hours: Temp:  [97.7 F (36.5 C)-99.3 F (37.4 C)] 98.6 F (37 C) (06/17 0844) Pulse Rate:  [92-100] 92 (06/17 0844) Resp:  [18-30] 25 (06/17 0800) BP: (117-137)/(46-70) 117/54 (06/17 0844) SpO2:  [92 %-98 %] 95 % (06/17 0844) Last BM Date: 11/03/16  Intake/Output from previous day: 06/16 0701 - 06/17 0700 In: 3370.8 [I.V.:2770.8; IV Piggyback:600] Out: 1400 [Urine:600; Emesis/NG output:650; Drains:75; Stool:75]  GI: Abdomen soft, appropriately tender to palpation midline, nondistended. Midline staples in place with out any evidence of erythema or purulence. Minimal serous drainage from the most dependent aspect. JP drain in the right lower quadrant with serous and was output. Colostomy in the left upper quadrant with evidence of superficial necrosis. On digital exam of the deeper colostomy there is palpable stool, gas, normal-appearing mucosa. The normal mucosa is approximate 1 cm below the skin level.  Lab Results:  CBC  Recent Labs  11/05/16 0808 11/07/16 0440  WBC 5.0 6.2  HGB 12.4 12.0  HCT 35.6 34.7*  PLT 297 236   CMP     Component Value Date/Time   NA 135 11/07/2016 0440   NA 139 05/30/2013 0520   K 3.5 11/07/2016 0440   K 3.6 05/30/2013 0520   CL 101 11/07/2016 0440   CL 108 (H) 05/30/2013 0520   CO2 28 11/07/2016 0440   CO2 27 05/30/2013 0520   GLUCOSE 148 (H) 11/07/2016 0440   GLUCOSE 98 05/30/2013 0520   BUN 14 11/07/2016 0440   BUN 11 05/30/2013 0520   CREATININE 0.70 11/07/2016 0440   CREATININE 0.90 05/30/2013 0520   CALCIUM 7.5 (L) 11/07/2016 0440   CALCIUM 7.9 (L) 05/30/2013 0520   PROT 6.5 11/01/2016 0501   PROT 8.2 05/29/2013 0631   ALBUMIN 3.2 (L) 11/01/2016 0501   ALBUMIN 3.8 05/29/2013 0631   AST 32 11/01/2016 0501   AST 33 05/29/2013 0631   ALT 38 11/01/2016 0501   ALT 54 05/29/2013 0631   ALKPHOS 47 11/01/2016 0501   ALKPHOS 64 05/29/2013 0631   BILITOT 0.7 11/01/2016 0501   BILITOT 0.5 05/29/2013 0631   GFRNONAA >60 11/07/2016 0440   GFRNONAA >60 05/30/2013 0520   GFRAA >60 11/07/2016 0440   GFRAA >60 05/30/2013 0520   PT/INR No results for input(s): LABPROT, INR in the last 72 hours.  Studies/Results: Dg Abd 1 View  Result Date: 11/05/2016 CLINICAL DATA:  Nasogastric tube placement. EXAM: ABDOMEN - 1 VIEW COMPARISON:  11/01/2016 CT FINDINGS: Nasogastric tube is in place, tip overlying the level of the distal stomach. Surgical clips overlie the lower central abdomen. A tube is coiled over the lower pelvis, incompletely evaluated. Visualized bowel gas pattern is nonobstructive. No evidence for free intraperitoneal air. IMPRESSION: Nasogastric tube tip overlying the level the distal stomach. Electronically Signed   By: Nolon Nations M.D.   On: 11/05/2016 10:35    Assessment/Plan: 66 year old female status post sigmoid colectomy for perforated diverticulitis. Postoperative ileus appears to have resolved to be resolving. Starting to show evidence of bowel function but no solid stool out of the ostomy  yet. It is palpable on exam. Superficial ostomy showing evidence of necrosis secondary to vascular compromise due to the patient's abdominal girth. Deeper tissue appeared normal. Plan to remove the NG tube today. She has been out of bed numerous times in last 24 hours of her Foley catheter was removed today as well. Wean her off the PCA to as needed morphine pushes. Likely keep her nothing by mouth except for ice chips today with reinstitution of clear liquid diet tomorrow should she do well without her NG tube. Encourage  ambulation and incentive spirometer usage.   [name], MD [facility]  Day [facility] [phone] Night [facility] [phone]  11/07/2016

## 2016-11-08 LAB — GLUCOSE, CAPILLARY
GLUCOSE-CAPILLARY: 108 mg/dL — AB (ref 65–99)
Glucose-Capillary: 129 mg/dL — ABNORMAL HIGH (ref 65–99)
Glucose-Capillary: 115 mg/dL — ABNORMAL HIGH (ref 65–99)
Glucose-Capillary: 114 mg/dL — ABNORMAL HIGH (ref 65–99)
Glucose-Capillary: 113 mg/dL — ABNORMAL HIGH (ref 65–99)

## 2016-11-08 NOTE — Consult Note (Signed)
[place] Nurse ostomy consult note Stoma type/location: LLQ Colostomy, 5 days post op.   Stomal assessment/size: 1 3/4" edematous, necrosis present to stoma superficially.  Digital exam reveals pink stomal tissue.  Stoma is producing dark green stool.  Peristomal assessment: intact. Stoma is only slightly budded. Large round abdomen makes stomal viewing difficult for patient.  Treatment options for stomal/peristomal skin: Barrier ring for added convexity.  Output dark effluent Ostomy pouching: 2pc.  2 3/4" pouch with barrier ring Education provided: Patient's siter in law had stoma so she is somewhat familiar with the purpose and function of stoma.  POuch change performed today.  Demonstrated measuring and cutting pouch. Understands to continue measuring for about 6 weeks as stoma size and appearance may change.  Explained that dark stomal tissue is present and that we will be observing to ensure that this sloughs away and that her stoma is functioning properly.  Explained rationale for barrier ring.  Ostomy care will be difficult for patient due to size and abdominal girth.  Bath services could assist with ongoing teaching in the home after discharge.  Enrolled patient in [place] program: No WOC team will follow.  Domenic Moras RN BSN [place] Pager [phone]

## 2016-11-08 NOTE — Progress Notes (Signed)
Initial Nutrition Assessment  DOCUMENTATION CODES:   Morbid obesity  INTERVENTION:  Once patient advanced past clear liquids and able to tolerate diet, recommend Ensure Enlive po TID, each supplement provides 350 kcal and 20 grams of protein. Patient reports she would prefer the strawberry flavor.   NUTRITION DIAGNOSIS:   Inadequate oral intake related to inability to eat, poor appetite, nausea as evidenced by per patient/family report, other (see comment) (NPO/CLD since admission).  GOAL:   Patient will meet greater than or equal to 90% of their needs  MONITOR:   Diet advancement, Labs, Weight trends, I & O's  REASON FOR ASSESSMENT:   NPO/Clear Liquid Diet    ASSESSMENT:   66 year old female with PMHx of DM, HTN, acute diverticulitis with microperforation presented with lower abdominal pain admitted with recurrent sigmoid diverticulitis. 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   -Diet was advanced to CLD on 6/13, but patient made NPO again on 6/14. -Patient had NGT to low intermittent suction placed 6/15. Was removed on 6/17.  -Diet now advanced to CLD again today.  Spoke with patient at bedside. She reports that so far today she has finished her serving of gelatin and has been sipping on Ginger-ale. She reports she wants to take is slow because she is having nausea and does not want to throw up. Reports her nausea is why her diet could not be advanced a few days ago and why she had to get an NGT, which irritated her throat. Patient reports that PTA she was eating very well. She reports eating 3 good meals per day, but reports she did watch her carbohydrate intake due to her hx of diabetes.   Patient reports her UBW is 275 lbs. Patient reports that over the past year she has lost approximately 40 lbs (14.5% body weight) intentionally to help manage her diabetes. This is intentional weight loss and is not significant for time frame. Per weights  in chart weight loss has actually likely occurred over longer than a year as patient's weight was only as high as 255-260 lbs in 2016.  Medications reviewed and include: Novolog 0-9 units TID with meals, pantoprazole, Cipro, Flagyl, LR @ 125 ml/hr, Zofran PRN, Phenergan PRN.  Labs reviewed: CBG 106-132 past 24 hrs.   Nutrition-Focused physical exam completed. Findings are no fat depletion, no muscle depletion, and mild edema.   Patient does not meet criteria for malnutrition at this time. She is at risk for acute malnutrition in setting of meeting </=50% estimated energy needs for >/=5 days and would benefit from an oral nutrition supplement with diet advancement/diet tolerance to prevent development of acute malnutrition. Recommended Premier Protein, but patient reports she wants to drink a strawberry flavor.   Discussed with RN.  Diet Order:  Diet clear liquid Room service appropriate? Yes; Fluid consistency: Thin  Skin:  Wound (see comment) (closed surgical incision abdomen)  Last BM:  11/06/2016 - 75 ml output in colostomy per chart  Height:   Ht Readings from Last 1 Encounters:  10/31/16 5\' 3"  (1.6 m)    Weight:   Wt Readings from Last 1 Encounters:  10/31/16 233 lb (105.7 kg)    Ideal Body Weight:  52.3 kg  BMI:  Body mass index is 41.27 kg/m.  Estimated Nutritional Needs:   Kcal:  1900-2200 (MSJ x 1.2-1.4)  Protein:  105-130 grams (1-1.2 grams/kg)  Fluid:  1.9-2.2 L/day (1 ml/kcal)  EDUCATION NEEDS:   No education needs  identified at this time  Willey Blade, MS, RD, LDN Pager: [phone] After Hours Pager: [phone]

## 2016-11-08 NOTE — Progress Notes (Signed)
Physical Therapy Evaluation Patient Details Name: Kara [name] MRN: [id number] DOB: 10/02/1950 Today's Date: 11/08/2016   History of Present Illness  This patient with a history of acute diverticulitis with microperforation who was doing well until yesterday morning. She developed lower abdominal pain which has gradually worsened she's had no fevers or chills but has vomited one time. This is very similar in character to her prior diverticulitis. She is required to prior admissions for IV antibiotics recently. I had seen the patient in the office last week at which time her symptoms were completely resolved. This represents a new finding.  Clinical Impression  Patient needs mod assist with supine to sit bed mobility and sit to supine bed mobility. She has guarded and slow mobility due to pain 3/10 to abdomen. She transfers sit to stand with RW and can stand for 1 minute before needing a rest period. She is not able to take any steps due to feeling that her legs will give away.Patient has BLE strength 3/5 limited by abdominal pain. She is able to stand 2 times at the bedside for 60 seconds and is able to shift her weight left and right but not able to take a step.     Follow Up Recommendations SNF    Equipment Recommendations  Rolling walker with 5" wheels    Recommendations for Other Services       Precautions / Restrictions Precautions Precautions: None Restrictions Weight Bearing Restrictions: No      Mobility  Bed Mobility Overal bed mobility: Needs Assistance Bed Mobility: Supine to Sit     Supine to sit: Mod assist     General bed mobility comments: Patient needs mod asssit and also uses the hoppital bed for asssitance  Transfers Overall transfer level: Needs assistance Equipment used: Rolling walker (2 wheeled)             General transfer comment: Patient needs mn assist for sit to stand transfer  Ambulation/Gait                Stairs             Wheelchair Mobility    Modified Rankin (Stroke Patients Only)       Balance Overall balance assessment: Needs assistance Sitting-balance support: Single extremity supported;Feet supported       Standing balance support: Bilateral upper extremity supported                                 Pertinent Vitals/Pain Pain Assessment: 0-10 Pain Score: 3  Pain Location: abdomen Pain Descriptors / Indicators: Aching Pain Intervention(s): Limited activity within patient's tolerance    Home Living Family/patient expects to be discharged to:: Private residence Living Arrangements: Spouse/significant other;Children   Type of Home: House         Home Equipment: None      Prior Function                 Hand Dominance        Extremity/Trunk Assessment   Upper Extremity Assessment Upper Extremity Assessment: Overall WFL for tasks assessed    Lower Extremity Assessment Lower Extremity Assessment: Generalized weakness    Cervical / Trunk Assessment Cervical / Trunk Assessment: Normal  Communication   Communication: No difficulties  Cognition Arousal/Alertness: Awake/alert Behavior During Therapy: WFL for tasks assessed/performed Overall Cognitive Status: Within Functional Limits for tasks assessed  General Comments      Exercises General Exercises - Lower Extremity Ankle Circles/Pumps: Strengthening;Right;Left;10 reps;Supine Quad Sets: Both;10 reps Gluteal Sets: Both;10 reps   Assessment/Plan    PT Assessment Patient needs continued PT services  PT Problem List Decreased strength;Decreased activity tolerance;Decreased balance;Decreased mobility;Pain       PT Treatment Interventions Gait training;Functional mobility training;Therapeutic exercise;Balance training;Therapeutic activities    PT Goals (Current goals can be found in the Care Plan section)  Acute Rehab PT  Goals Patient Stated Goal: to be able to walk PT Goal Formulation: With patient Time For Goal Achievement: 11/26/16    Frequency Min 2X/week   Barriers to discharge        Co-evaluation               AM-PAC PT "6 Clicks" Daily Activity  Outcome Measure Difficulty turning over in bed (including adjusting bedclothes, sheets and blankets)?: A Lot Difficulty moving from lying on back to sitting on the side of the bed? : A Lot Difficulty sitting down on and standing up from a chair with arms (e.g., wheelchair, bedside commode, etc,.)?: A Lot Help needed moving to and from a bed to chair (including a wheelchair)?: A Lot Help needed walking in hospital room?: Total Help needed climbing 3-5 steps with a railing? : Total 6 Click Score: 10    End of Session   Activity Tolerance: Patient limited by fatigue;Patient limited by pain Patient left: in bed;with bed alarm set Nurse Communication: Mobility status PT Visit Diagnosis: Pain;Muscle weakness (generalized) (M62.81);Unsteadiness on feet (R26.81)    Time: 4818-5631 PT Time Calculation (min) (ACUTE ONLY): 45 min   Charges:   PT Evaluation $PT Eval Low Complexity: 1 Procedure PT Treatments $Therapeutic Activity: 8-22 mins   PT G Codes:   PT G-Codes **NOT FOR INPATIENT CLASS** Functional Assessment Tool Used: AM-PAC 6 Clicks Basic Mobility Functional Limitation: Mobility: Walking and moving around Mobility: Walking and Moving Around Current Status (S9702): At least 80 percent but less than 100 percent impaired, limited or restricted Mobility: Walking and Moving Around Goal Status [phone]): At least 80 percent but less than 100 percent impaired, limited or restricted   Alanson Puls, PT, DPT  Arelia Sneddon S 11/08/2016, 5:12 PM

## 2016-11-08 NOTE — Progress Notes (Signed)
6 Days Post-Op  Subjective: Patient status post Hartman's procedure. She feels well wants to drink fluids today. She has no nausea or vomiting. Her ostomy bag is dry today but she states it was just changed and had material within it. Adonis Huguenin has communicated to me that there is concerns about colostomy ischemia which is superficial.  Objective: Vital signs in last 24 hours: Temp:  [98.3 F (36.8 C)-99 F (37.2 C)] 98.3 F (36.8 C) (06/18 0507) Pulse Rate:  [92-96] 92 (06/18 0507) Resp:  [18-24] 18 (06/18 0507) BP: (131-144)/(54-62) 131/62 (06/18 0507) SpO2:  [90 %-93 %] 90 % (06/18 0507) Last BM Date: 11/03/16  Intake/Output from previous day: 06/17 0701 - 06/18 0700 In: 3436.9 [I.V.:2936.9; IV Piggyback:500] Out: 1540 [Urine:500; Emesis/NG output:600; Drains:95] Intake/Output this shift: No intake/output data recorded.  Physical exam:  No acute distress vital signs are reviewed obese patient Abdomen is soft nondistended nontympanitic and nontender serous drain fluid in the right lower quadrant drain. Wound is clean without erythema or drainage. Superficial ostomy necrosis but visual inspection with palpation demonstrates pinker tissue deep to the superficial area. No ostomy output and bag this morning the patient reports that it was putting out material earlier today. Calves are nontender  Lab Results: CBC   Recent Labs  11/07/16 0440  WBC 6.2  HGB 12.0  HCT 34.7*  PLT 236   BMET  Recent Labs  11/07/16 0440  NA 135  K 3.5  CL 101  CO2 28  GLUCOSE 148*  BUN 14  CREATININE 0.70  CALCIUM 7.5*   PT/INR No results for input(s): LABPROT, INR in the last 72 hours. ABG No results for input(s): PHART, HCO3 in the last 72 hours.  Invalid input(s): PCO2, PO2  Studies/Results: No results found.  Anti-infectives: Anti-infectives    Start     Dose/Rate Route Frequency Ordered Stop   11/01/16 1600  ciprofloxacin (CIPRO) IVPB 400 mg     400 mg 200 mL/hr over 60  Minutes Intravenous Every 12 hours 11/01/16 1208     11/01/16 1300  metroNIDAZOLE (FLAGYL) IVPB 500 mg     500 mg 100 mL/hr over 60 Minutes Intravenous Every 8 hours 11/01/16 1208     11/01/16 0315  ciprofloxacin (CIPRO) IVPB 400 mg     400 mg 200 mL/hr over 60 Minutes Intravenous  Once 11/01/16 0311 11/01/16 0450   11/01/16 0315  metroNIDAZOLE (FLAGYL) IVPB 500 mg  Status:  Discontinued     500 mg 100 mL/hr over 60 Minutes Intravenous  Once 11/01/16 0311 11/01/16 1208      Assessment/Plan: s/p Procedure(s): COLON RESECTION SIGMOID COLOSTOMY   White blood cell count is normal and no acidosis Colostomy site ischemia which appears superficial. Continue to observe at this point. We will advance to clear liquid diet and reexamine.  Florene Glen, MD, FACS  11/08/2016

## 2016-11-08 NOTE — Progress Notes (Signed)
Patient states that she was told that she will be seen by PT and refuses to walk until seen by PT. PT consult placed due to patient C/O being to weak to walk.

## 2016-11-09 LAB — CBC WITH DIFFERENTIAL/PLATELET
BASOS PCT: 0 %
Basophils Absolute: 0 10*3/uL (ref 0–0.1)
EOS ABS: 0.1 10*3/uL (ref 0–0.7)
Eosinophils Relative: 1 %
HEMATOCRIT: 37.2 % (ref 35.0–47.0)
HEMOGLOBIN: 12.5 g/dL (ref 12.0–16.0)
Lymphocytes Relative: 12 %
Lymphs Abs: 0.7 10*3/uL — ABNORMAL LOW (ref 1.0–3.6)
MCH: 30.5 pg (ref 26.0–34.0)
MCHC: 33.7 g/dL (ref 32.0–36.0)
MCV: 90.3 fL (ref 80.0–100.0)
Monocytes Absolute: 0.7 10*3/uL (ref 0.2–0.9)
Monocytes Relative: 12 %
NEUTROS ABS: 4.5 10*3/uL (ref 1.4–6.5)
NEUTROS PCT: 75 %
Platelets: 297 10*3/uL (ref 150–440)
RBC: 4.12 MIL/uL (ref 3.80–5.20)
RDW: 13.4 % (ref 11.5–14.5)
WBC: 6.1 10*3/uL (ref 3.6–11.0)

## 2016-11-09 LAB — GLUCOSE, CAPILLARY
GLUCOSE-CAPILLARY: 122 mg/dL — AB (ref 65–99)
GLUCOSE-CAPILLARY: 130 mg/dL — AB (ref 65–99)
GLUCOSE-CAPILLARY: 103 mg/dL — AB (ref 65–99)
Glucose-Capillary: 152 mg/dL — ABNORMAL HIGH (ref 65–99)

## 2016-11-09 LAB — BASIC METABOLIC PANEL
ANION GAP: 9 (ref 5–15)
BUN: 8 mg/dL (ref 6–20)
CHLORIDE: 103 mmol/L (ref 101–111)
CO2: 25 mmol/L (ref 22–32)
Calcium: 7.6 mg/dL — ABNORMAL LOW (ref 8.9–10.3)
Creatinine, Ser: 0.57 mg/dL (ref 0.44–1.00)
GFR calc non Af Amer: >60 mL/min (ref >60)
Glucose, Bld: 134 mg/dL — ABNORMAL HIGH (ref 65–99)
POTASSIUM: 3.3 mmol/L — AB (ref 3.5–5.1)
Sodium: 137 mmol/L (ref 135–145)

## 2016-11-09 MED ORDER — HYDROMORPHONE HCL 1 MG/ML IJ SOLN
1.0000 mg | INTRAMUSCULAR | Status: DC | PRN
  Administered 2016-11-09 – 2016-11-14 (×14): 1 mg via INTRAVENOUS
  Filled 2016-11-09 (×14): qty 1

## 2016-11-09 NOTE — Progress Notes (Signed)
7 Days Post-Op  Subjective: Patient is having nausea and she thinks that it is in relation temporarily to IV morphine. She wishes to change her pain medications to something different. She's had minimal output of her ostomy bag.  Objective: Vital signs in last 24 hours: Temp:  [98 F (36.7 C)-98.6 F (37 C)] 98 F (36.7 C) (06/19 1211) Pulse Rate:  [92-97] 92 (06/19 1211) Resp:  [20-22] 22 (06/19 1211) BP: (135-146)/(62-70) 143/70 (06/19 1211) SpO2:  [91 %-96 %] 94 % (06/19 1211) Last BM Date: 11/07/16  Intake/Output from previous day: 06/18 0701 - 06/19 0700 In: 3253.2 [I.V.:2853.2; IV Piggyback:400] Out: 245 [Urine:200; Drains:45] Intake/Output this shift: No intake/output data recorded.  Physical exam:  Morbidly obese female patient ostomy is dark and no stool in the bag.  Glass 2 endoscopy of the ostomy was performed and while it is quite dark on the surface beneath the skin it becomes a lighter color but not completely pink. Calves are nontender Wound is clean without erythe  Lab Results: CBC   Recent Labs  11/07/16 0440 11/09/16 0610  WBC 6.2 6.1  HGB 12.0 12.5  HCT 34.7* 37.2  PLT 236 297   BMET  Recent Labs  11/07/16 0440 11/09/16 0610  NA 135 137  K 3.5 3.3*  CL 101 103  CO2 28 25  GLUCOSE 148* 134*  BUN 14 8  CREATININE 0.70 0.57  CALCIUM 7.5* 7.6*   PT/INR No results for input(s): LABPROT, INR in the last 72 hours. ABG No results for input(s): PHART, HCO3 in the last 72 hours.  Invalid input(s): PCO2, PO2  Studies/Results: No results found.  Anti-infectives: Anti-infectives    Start     Dose/Rate Route Frequency Ordered Stop   11/01/16 1600  ciprofloxacin (CIPRO) IVPB 400 mg     400 mg 200 mL/hr over 60 Minutes Intravenous Every 12 hours 11/01/16 1208     11/01/16 1300  metroNIDAZOLE (FLAGYL) IVPB 500 mg     500 mg 100 mL/hr over 60 Minutes Intravenous Every 8 hours 11/01/16 1208     11/01/16 0315  ciprofloxacin (CIPRO) IVPB 400  mg     400 mg 200 mL/hr over 60 Minutes Intravenous  Once 11/01/16 0311 11/01/16 0450   11/01/16 0315  metroNIDAZOLE (FLAGYL) IVPB 500 mg  Status:  Discontinued     500 mg 100 mL/hr over 60 Minutes Intravenous  Once 11/01/16 0311 11/01/16 1208      Assessment/Plan: s/p Procedure(s): COLON RESECTION SIGMOID COLOSTOMY   Dusky ostomy following a Hartman's procedure. We'll continue to observe at this point she may require an operation should this progress and I am not terribly optimistic that this ostomy will remain functional. We will change the IV morphine to Dilaudid and see if that helps with her nausea.  Kara Glen, MD, FACS  11/09/2016

## 2016-11-10 LAB — BASIC METABOLIC PANEL
ANION GAP: 7 (ref 5–15)
BUN: 6 mg/dL (ref 6–20)
CHLORIDE: 104 mmol/L (ref 101–111)
CO2: 28 mmol/L (ref 22–32)
Calcium: 7.6 mg/dL — ABNORMAL LOW (ref 8.9–10.3)
Creatinine, Ser: 0.55 mg/dL (ref 0.44–1.00)
GFR calc non Af Amer: >60 mL/min (ref >60)
GLUCOSE: 112 mg/dL — AB (ref 65–99)
Potassium: 3 mmol/L — ABNORMAL LOW (ref 3.5–5.1)
Sodium: 139 mmol/L (ref 135–145)

## 2016-11-10 LAB — GLUCOSE, CAPILLARY
GLUCOSE-CAPILLARY: 125 mg/dL — AB (ref 65–99)
Glucose-Capillary: 122 mg/dL — ABNORMAL HIGH (ref 65–99)
Glucose-Capillary: 145 mg/dL — ABNORMAL HIGH (ref 65–99)
Glucose-Capillary: 103 mg/dL — ABNORMAL HIGH (ref 65–99)

## 2016-11-10 LAB — CBC WITH DIFFERENTIAL/PLATELET
BASOS ABS: 0 10*3/uL (ref 0–0.1)
BASOS PCT: 0 %
Eosinophils Absolute: 0.1 10*3/uL (ref 0–0.7)
Eosinophils Relative: 1 %
HEMATOCRIT: 36 % (ref 35.0–47.0)
HEMOGLOBIN: 12.5 g/dL (ref 12.0–16.0)
LYMPHS PCT: 14 %
Lymphs Abs: 0.9 10*3/uL — ABNORMAL LOW (ref 1.0–3.6)
MCH: 31.5 pg (ref 26.0–34.0)
MCHC: 34.7 g/dL (ref 32.0–36.0)
MCV: 90.7 fL (ref 80.0–100.0)
MONOS PCT: 12 %
Monocytes Absolute: 0.8 10*3/uL (ref 0.2–0.9)
NEUTROS PCT: 73 %
Neutro Abs: 5 10*3/uL (ref 1.4–6.5)
Platelets: 297 10*3/uL (ref 150–440)
RBC: 3.97 MIL/uL (ref 3.80–5.20)
RDW: 13.3 % (ref 11.5–14.5)
WBC: 6.8 10*3/uL (ref 3.6–11.0)

## 2016-11-10 NOTE — Progress Notes (Signed)
Physical Therapy Treatment Patient Details Name: Dylan Ruotolo MRN: [id number] DOB: 1951/03/15 Today's Date: 11/10/2016    History of Present Illness This patient with a history of acute diverticulitis with microperforation who was doing well until yesterday morning. She developed lower abdominal pain which has gradually worsened she's had no fevers or chills but has vomited one time. This is very similar in character to her prior diverticulitis. She is required to prior admissions for IV antibiotics recently. I had seen the patient in the office last week at which time her symptoms were completely resolved. This represents a new finding.   PT Comments    Prior to arrival to room, pt had just transfer from recliner to bed with RN. Pt demonstrated increased strength this session, shown by increased there-ex exercises and repetitions. Pt attempted to move supine to EOB with min assist (pt tended to thrust herself up to this position), but she became very nauseous once EOB and had to lay back down quickly, which required mod assist. Pt then able to adjust herself toward [facility] with supervision. Plan to progress.   Follow Up Recommendations  SNF     Equipment Recommendations  Rolling walker with 5" wheels    Recommendations for Other Services       Precautions / Restrictions Precautions Precautions: Fall Restrictions Weight Bearing Restrictions: No    Mobility  Bed Mobility Overal bed mobility: Needs Assistance Bed Mobility: Supine to Sit;Sit to Supine     Supine to sit: Min assist Sit to supine: Mod assist   General bed mobility comments: Pt moved very quickly and impulsively from rolled position to EOB, which caused an intense episode of nausea. Pt immediately requested to lay back down. Once supine, her nausea decreased within 45min. Pt able to reposition herself at [facility] with supervision.  Transfers                 General transfer comment: Not attempted due to  pt's nausea and fatigue from transferring from recliner to bed just prior to this session with nurses.  Ambulation/Gait             General Gait Details: Not attempted due to pt's nausea and fatigue from transferring from recliner to bed just prior to this session with nurses.   Stairs            Wheelchair Mobility    Modified Rankin (Stroke Patients Only)       Balance Overall balance assessment: Needs assistance Sitting-balance support: Bilateral upper extremity supported;Feet supported   Sitting balance - Comments: Pt sat EOB for 10s prior to requesting to lay back down due to nausea.                                     Cognition Arousal/Alertness: Awake/alert Behavior During Therapy: WFL for tasks assessed/performed Overall Cognitive Status: Within Functional Limits for tasks assessed                                        Exercises Other Exercises Other Exercises: Supine ther-ex x12 B included: ankle pumps, SLR's, quad set, hip abd, and heel slides. Performed with supervision and verbal cueing for sequencing and breathing techniques. Pt SOB and required 3 rest breaks during ther-ex due to fatigue.    General Comments  Pertinent Vitals/Pain Pain Assessment: 0-10 Pain Score: 3  Pain Location: abdomen Pain Descriptors / Indicators: Operative site guarding;Aching;Grimacing Pain Intervention(s): Limited activity within patient's tolerance;Monitored during session;Repositioned    Home Living                      Prior Function            PT Goals (current goals can now be found in the care plan section) Acute Rehab PT Goals Patient Stated Goal: to be able to walk PT Goal Formulation: With patient Time For Goal Achievement: 11/26/16 Progress towards PT goals: Progressing toward goals    Frequency    Min 2X/week      PT Plan Current plan remains appropriate    Co-evaluation               AM-PAC PT "6 Clicks" Daily Activity  Outcome Measure  Difficulty turning over in bed (including adjusting bedclothes, sheets and blankets)?: A Lot Difficulty moving from lying on back to sitting on the side of the bed? : Total Difficulty sitting down on and standing up from a chair with arms (e.g., wheelchair, bedside commode, etc,.)?: Total Help needed moving to and from a bed to chair (including a wheelchair)?: A Lot Help needed walking in hospital room?: Total Help needed climbing 3-5 steps with a railing? : Total 6 Click Score: 8    End of Session   Activity Tolerance: Patient limited by fatigue;Other (comment) (Limited by nausea.) Patient left: in bed;with call bell/phone within reach;with bed alarm set;with nursing/sitter in room Nurse Communication: Mobility status PT Visit Diagnosis: Pain;Muscle weakness (generalized) (M62.81);Unsteadiness on feet (R26.81) Pain - part of body:  (abdomen)     Time: 9735-3299 PT Time Calculation (min) (ACUTE ONLY): 15 min  Charges:                       G Codes:       Donaciano Eva, PT, SPT  Marni Griffon 11/10/2016, 5:15 PM

## 2016-11-10 NOTE — Progress Notes (Signed)
8 Days Post-Op  Subjective: Patient states she feels better today no nausea she is tolerating clear liquids. She still has no ostomy output however no fevers or chills  Objective: Vital signs in last 24 hours: Temp:  [97.7 F (36.5 C)-98.4 F (36.9 C)] 98.4 F (36.9 C) (06/20 0448) Pulse Rate:  [92-98] 93 (06/20 0931) Resp:  [18-22] 18 (06/20 0448) BP: (130-143)/(64-70) 130/70 (06/20 0931) SpO2:  [92 %-94 %] 94 % (06/20 0448) Last BM Date: 11/07/16  Intake/Output from previous day: 06/19 0701 - 06/20 0700 In: 0  Out: 50 [Drains:50] Intake/Output this shift: Total I/O In: 120 [P.O.:120] Out: -   Physical exam:  Vital signs are stable and afebrile Obese but comfortable patient Abdominal wound is clean ostomy remains dark and with minimal if any output into the bag. Calves are nontender  Lab Results: CBC   Recent Labs  11/09/16 0610 11/10/16 0422  WBC 6.1 6.8  HGB 12.5 12.5  HCT 37.2 36.0  PLT 297 297   BMET  Recent Labs  11/09/16 0610 11/10/16 0422  NA 137 139  K 3.3* 3.0*  CL 103 104  CO2 25 28  GLUCOSE 134* 112*  BUN 8 6  CREATININE 0.57 0.55  CALCIUM 7.6* 7.6*   PT/INR No results for input(s): LABPROT, INR in the last 72 hours. ABG No results for input(s): PHART, HCO3 in the last 72 hours.  Invalid input(s): PCO2, PO2  Studies/Results: No results found.  Anti-infectives: Anti-infectives    Start     Dose/Rate Route Frequency Ordered Stop   11/01/16 1600  ciprofloxacin (CIPRO) IVPB 400 mg     400 mg 200 mL/hr over 60 Minutes Intravenous Every 12 hours 11/01/16 1208     11/01/16 1300  metroNIDAZOLE (FLAGYL) IVPB 500 mg     500 mg 100 mL/hr over 60 Minutes Intravenous Every 8 hours 11/01/16 1208     11/01/16 0315  ciprofloxacin (CIPRO) IVPB 400 mg     400 mg 200 mL/hr over 60 Minutes Intravenous  Once 11/01/16 0311 11/01/16 0450   11/01/16 0315  metroNIDAZOLE (FLAGYL) IVPB 500 mg  Status:  Discontinued     500 mg 100 mL/hr over 60  Minutes Intravenous  Once 11/01/16 0311 11/01/16 1208      Assessment/Plan: s/p Procedure(s): COLON RESECTION SIGMOID COLOSTOMY   Status post Hartman's procedure for perforated diverticulitis. Ostomy remains dusky and not yet functional. Would recommend observation at this time but may require reexploration and revision of the colostomy should this not improve soon.  Florene Glen, MD, FACS  11/10/2016

## 2016-11-11 LAB — GLUCOSE, CAPILLARY
GLUCOSE-CAPILLARY: 135 mg/dL — AB (ref 65–99)
GLUCOSE-CAPILLARY: 133 mg/dL — AB (ref 65–99)
Glucose-Capillary: 132 mg/dL — ABNORMAL HIGH (ref 65–99)
Glucose-Capillary: 147 mg/dL — ABNORMAL HIGH (ref 65–99)

## 2016-11-11 MED ORDER — DIPHENHYDRAMINE HCL 25 MG PO CAPS
25.0000 mg | ORAL_CAPSULE | Freq: Four times a day (QID) | ORAL | Status: DC | PRN
  Administered 2016-11-11: 25 mg via ORAL
  Filled 2016-11-11: qty 1

## 2016-11-11 MED ORDER — ENSURE ENLIVE PO LIQD
237.0000 mL | Freq: Three times a day (TID) | ORAL | Status: DC
  Administered 2016-11-11 – 2016-11-14 (×6): 237 mL via ORAL

## 2016-11-11 NOTE — Progress Notes (Signed)
Nutrition Follow-up  DOCUMENTATION CODES:   Morbid obesity  INTERVENTION:  Provide Ensure Enlive po TID, each supplement provides 350 kcal and 20 grams of protein. Patient prefers strawberry.  Provided "Colostomy Nutrition Therapy" handout from the Academy of [employer] Dietetics. Encouraged patient to keep fiber intake below 8 grams during transition from liquids to solids, and below 13 grams per day as symptoms subside. Encouraged patient to consume refined and white grain foods with less than 2g of fiber per serving as well as soft, well-cooked proteins, and well cooked vegetables without seeds or skin. Discussed why it is important to adhere to list of recommended foods, and foods to avoid, to maintain ostomy integrity and decrease risk of gas, diarrhea, and blockage related complications. Encouraged patient to consume 8 to 10 cups of liquid per day. After 6 weeks, as patient's diet returns to normal, encouraged adding 1 new food in per day, for a few days to monitor tolerance.  NUTRITION DIAGNOSIS:   Inadequate oral intake related to inability to eat, poor appetite, nausea as evidenced by per patient/family report, other (see comment) (NPO/CLD since admission).  Ongoing inadequate intake, even following diet progression. Addressing with education and oral nutrition supplement.  GOAL:   Patient will meet greater than or equal to 90% of their needs  Not met.   MONITOR:   Diet advancement, Labs, Weight trends, I & O's  REASON FOR ASSESSMENT:   NPO/Clear Liquid Diet    ASSESSMENT:   66 year old female with PMHx of DM, HTN, acute diverticulitis with microperforation presented with lower abdominal pain admitted with recurrent sigmoid diverticulitis. 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  -Per Surgery note patient may require revision of colostomy unless ostomy output progresses. Will continue to monitor.  Spoke with patient at bedside.  She reports her appetite is slowly returning. Her nausea is much better now that her pain medications have been changed. She just wants to take her diet progression slow so she does not develop any problems. Patient reports she is having some small output in her ostomy.   Meal Completion: 100% of dinner last night and 75% of breakfast this morning (pt cannot remember how much of her lunch she had yesterday and it is not recorded in chart) In the past 24 hours patient has had approximately 945 kcal (50% minimum estimated kcal needs) and 35 grams of protein (33% minimum estimated protein needs).   Medications reviewed and include: Novolog 0-9 units TID, pantoprazole, Cipro, Flagyl, LR @ 125 ml/hr.  Labs reviewed: CBG 103-147 past 24 hrs. On 6/20 Potassium 3.   No subsequent weights from admission to trend.   I/O: 15 ml output from colostomy past 24 hrs, 40 ml output from JP drain, 4 occurrences unmeasured UOP  Diet Order:  DIET SOFT Room service appropriate? Yes; Fluid consistency: Thin  Skin:  Wound (see comment) (closed surgical incision abdomen)  Last BM:  11/10/2016 - 15 ml output in colostomy  Height:   Ht Readings from Last 1 Encounters:  10/31/16 '5\' 3"'$  (1.6 m)    Weight:   Wt Readings from Last 1 Encounters:  10/31/16 233 lb (105.7 kg)    Ideal Body Weight:  52.3 kg  BMI:  Body mass index is 41.27 kg/m.  Estimated Nutritional Needs:   Kcal:  1900-2200 (MSJ x 1.2-1.4)  Protein:  105-130 grams (1-1.2 grams/kg)  Fluid:  1.9-2.2 L/day (1 ml/kcal)  EDUCATION NEEDS:   Education needs addressed  Kara [name]  Minette Brine, MS, RD, LDN Pager: [phone] After Hours Pager: [phone]

## 2016-11-11 NOTE — Progress Notes (Addendum)
Pt with c/o itching after her bath. Suspected reaction to soap as pt noted she is very sensitive to our soaps and cleansers . Multiple, red, elevated spots noted on chest, abdomen and back. Per MD order will order benadryl and monitor situation closely.

## 2016-11-11 NOTE — Consult Note (Signed)
[place] Nurse ostomy follow up  Stoma type/location: LLQ Colostomy, 8 days post op.   Stomal assessment/size: 1 1/2" less edeamtous today, necrosis present to stoma superficially.  Digital exam reveals pink stomal tissue.  Stoma is producing brown stool noted in pouch.  Stomal separation noted from 6 to 12 o'clock.  This area is protected with a pectin barrier ring.  Peristomal assessment: intact. Stoma is only slightly budded. Large round abdomen makes stomal viewing difficult for patient.  Treatment options for stomal/peristomal skin: Barrier ring for added convexity.  Output dark brown effluent Ostomy pouching: 2pc.  2 1/2" pouch with barrier ring.  Pouch size decreased today.  Education provided: Patient's siter in law had stoma so she is somewhat familiar with the purpose and function of stoma.  POuch change performed today.  Demonstrated measuring and cutting pouch. Understands to continue measuring for about 6 weeks as stoma size and appearance may change.  Explained that dark stomal tissue is present and that we will be observing to ensure that this sloughs away and that her stoma is functioning properly.  Explained rationale for barrier ring, including improved seal and wear tim eand the stomal separation.   Ostomy care will be difficult for patient due to size and abdominal girth.  [place] services could assist with ongoing teaching in the home after discharge.  Enrolled patient in [place] program: No WOC team will follow.  Domenic Moras RN BSN [place] Pager [phone]

## 2016-11-11 NOTE — Progress Notes (Signed)
9 Days Post-Op  Subjective: Status post Hartman's procedure for perforated diverticulitis. Patient feels better again today no nausea no vomiting her nausea is been completely resolved with the change in pain medication and she is requiring minimal pain medication. She now feels as if she is having ostomy output  Objective: Vital signs in last 24 hours: Temp:  [98 F (36.7 C)-98.4 F (36.9 C)] 98 F (36.7 C) (06/21 0700) Pulse Rate:  [91-94] 94 (06/21 0700) Resp:  [20-22] 20 (06/21 0700) BP: (114-139)/(64-70) 114/64 (06/21 0700) SpO2:  [89 %-94 %] 89 % (06/21 0700) Last BM Date: 11/07/16  Intake/Output from previous day: 06/20 0701 - 06/21 0700 In: 6995.4 [P.O.:360; I.V.:6235.4; IV Piggyback:400] Out: 55 [Drains:40; Stool:15] Intake/Output this shift: No intake/output data recorded.  Physical exam:  Vital signs are stable she is afebrile abdomen is soft nondistended nontympanitic but obese nontender wound is clean no erythema serous fluid in drain ostomy remains dark but now has stool output.  Lab Results: CBC   Recent Labs  11/09/16 0610 11/10/16 0422  WBC 6.1 6.8  HGB 12.5 12.5  HCT 37.2 36.0  PLT 297 297   BMET  Recent Labs  11/09/16 0610 11/10/16 0422  NA 137 139  K 3.3* 3.0*  CL 103 104  CO2 25 28  GLUCOSE 134* 112*  BUN 8 6  CREATININE 0.57 0.55  CALCIUM 7.6* 7.6*   PT/INR No results for input(s): LABPROT, INR in the last 72 hours. ABG No results for input(s): PHART, HCO3 in the last 72 hours.  Invalid input(s): PCO2, PO2  Studies/Results: No results found.  Anti-infectives: Anti-infectives    Start     Dose/Rate Route Frequency Ordered Stop   11/01/16 1600  ciprofloxacin (CIPRO) IVPB 400 mg     400 mg 200 mL/hr over 60 Minutes Intravenous Every 12 hours 11/01/16 1208     11/01/16 1300  metroNIDAZOLE (FLAGYL) IVPB 500 mg     500 mg 100 mL/hr over 60 Minutes Intravenous Every 8 hours 11/01/16 1208     11/01/16 0315  ciprofloxacin (CIPRO)  IVPB 400 mg     400 mg 200 mL/hr over 60 Minutes Intravenous  Once 11/01/16 0311 11/01/16 0450   11/01/16 0315  metroNIDAZOLE (FLAGYL) IVPB 500 mg  Status:  Discontinued     500 mg 100 mL/hr over 60 Minutes Intravenous  Once 11/01/16 0311 11/01/16 1208      Assessment/Plan: s/p Procedure(s): [place] remained normal and ostomy is now putting out stool. I discussed the patient's care with Dr. Adonis Huguenin yesterday and we decided that continued observation was warranted especially if stool output occurs. Patient still may require revision and less good ostomy output progresses and we'll continue to to observe at this time.  Florene Glen, MD, FACS  11/11/2016

## 2016-11-11 NOTE — NC FL2 (Signed)
[place] LEVEL OF CARE SCREENING TOOL     IDENTIFICATION  Patient Name: Kara [name] Birthdate: 1951-04-26 Sex: female Admission Date (Current Location): 11/01/2016  [facility] [address] and [place] Number:  Engineering geologist and Address:  [facility], [address] [place], [place] [address]      Provider Number: [id number]  Attending Physician Name and Address:  Florene Glen, MD  Relative Name and Phone Number:       Current Level of Care: Hospital Recommended Level of Care: [place] Prior Approval Number:    Date Approved/Denied:   PASRR Number:    Discharge Plan: SNF    Current Diagnoses: Patient Active Problem List   Diagnosis Date Noted  . Diverticulitis 10/03/2016  . Diverticulitis large intestine 09/29/2016  . Benign essential hypertension 08/26/2016  . DDD (degenerative disc disease), lumbar 07/14/2016  . Acute non-recurrent maxillary sinusitis 05/21/2016  . Acute hip pain, left 02/23/2016  . Chronic low back pain without sciatica 11/14/2015  . Cough 07/30/2015  . Nocturnal leg cramps 02/20/2015  . Diabetes mellitus type 2 in obese ([place]) 11/12/2014  . BP (high blood pressure) 11/12/2014    Orientation RESPIRATION BLADDER Height & Weight     Self, Time, Situation, Place  Normal Continent Weight: 233 lb (105.7 kg) Height:  5\' 3"  (160 cm)  BEHAVIORAL SYMPTOMS/MOOD NEUROLOGICAL BOWEL NUTRITION STATUS   (none)  (none) Colostomy Diet (soft)  AMBULATORY STATUS COMMUNICATION OF NEEDS Skin   Extensive Assist Verbally Normal                       Personal Care Assistance Level of Assistance  Bathing, Dressing Bathing Assistance: Limited assistance   Dressing Assistance: Limited assistance     Functional Limitations Info   (no issues)          SPECIAL CARE FACTORS FREQUENCY  PT (By licensed PT)   Isolation: MRSA                    Contractures Contractures Info: Not  present    Additional Factors Info  Code Status Code Status Info: full             Current Medications (11/11/2016):  This is the current hospital active medication list Current Facility-Administered Medications  Medication Dose Route Frequency Provider Last Rate Last Dose  . alum & mag hydroxide-simeth (MAALOX/MYLANTA) 200-200-20 MG/5ML suspension 15 mL  15 mL Oral Q6H PRN [name], MD   15 mL at 11/03/16 1451  . aspirin EC tablet 81 mg  81 mg Oral Daily Florene Glen, MD   81 mg at 11/11/16 0086  . ciprofloxacin (CIPRO) IVPB 400 mg  400 mg Intravenous Q12H [name], MD 200 mL/hr at 11/11/16 0412 400 mg at 11/11/16 0412  . cyclobenzaprine (FLEXERIL) tablet 5 mg  5 mg Oral Q8H PRN Caroleen Hamman F, MD   5 mg at 11/03/16 2255  . diphenhydrAMINE (BENADRYL) capsule 25 mg  25 mg Oral Q6H PRN Florene Glen, MD   25 mg at 11/11/16 1012  . heparin injection 5,000 Units  5,000 Units Subcutaneous Q8H Florene Glen, MD   5,000 Units at 11/11/16 0549  . HYDROcodone-acetaminophen (NORCO/VICODIN) 5-325 MG per tablet 1 tablet  1 tablet Oral Q4H PRN Florene Glen, MD   1 tablet at 11/09/16 0809  . HYDROmorphone (DILAUDID) injection 1 mg  1 mg Intravenous Q2H PRN Florene Glen,  MD   1 mg at 11/11/16 0442  . hydrOXYzine (ATARAX) 10 MG/5ML syrup 25 mg  25 mg Oral TID PRN [name], MD   25 mg at 11/07/16 1424  . insulin aspart (novoLOG) injection 0-9 Units  0-9 Units Subcutaneous TID WC [name], MD   1 Units at 11/11/16 [phone]  . lactated ringers infusion   Intravenous Continuous Florene Glen, MD 125 mL/hr at 11/11/16 0209    . losartan (COZAAR) tablet 50 mg  50 mg Oral Daily Florene Glen, MD   50 mg at 11/11/16 0827  . metroNIDAZOLE (FLAGYL) IVPB 500 mg  500 mg Intravenous Ferne Reus, MD 100 mL/hr at 11/11/16 0549 500 mg at 11/11/16 0549  . ondansetron (ZOFRAN) tablet 4 mg  4 mg Oral Q6H PRN Florene Glen, MD   4 mg at 11/09/16 0810    Or  . ondansetron (ZOFRAN) injection 4 mg  4 mg Intravenous Q6H PRN Florene Glen, MD   4 mg at 11/08/16 2348  . pantoprazole (PROTONIX) injection 40 mg  40 mg Intravenous Q12H Pabon, Bea Graff F, MD   40 mg at 11/11/16 0826  . promethazine (PHENERGAN) injection 12.5 mg  12.5 mg Intravenous Q6H PRN [name], MD   12.5 mg at 11/10/16 0423     Discharge Medications: Please see discharge summary for a list of discharge medications.  Relevant Imaging Results:  Relevant Lab Results:   Additional Information ss: [id number]  Shela Leff, LCSW

## 2016-11-11 NOTE — Clinical Social Work Note (Signed)
Clinical Social Work Assessment  Patient Details  Name: Kara [name] MRN: [id number] Date of Birth: 21-Feb-1951  Date of referral:  11/11/16               Reason for consult:  Facility Placement                Permission sought to share information with:  Chartered certified accountant granted to share information::  Yes, Verbal Permission Granted  Name::        Agency::     Relationship::     Contact Information:     Housing/Transportation Living arrangements for the past 2 months:  Single Family Home Source of Information:  Patient Patient Interpreter Needed:  None Criminal Activity/Legal Involvement Pertinent to Current Situation/Hospitalization:  No - Comment as needed Significant Relationships:  Spouse Lives with:  Spouse Do you feel safe going back to the place where you live?  Yes Need for family participation in patient care:  Yes (Comment)  Care giving concerns:  Patient resides with her spouse.   Social Worker assessment / plan:  PT has assessed patient and are recommending STR. Patient also has a new colostomy. Patient is requesting STR as well. CSW met with patient and explained role and purpose of visit. Patient stated she has never been to rehab but is wanting to go this time. CSW explained to her the bed search process and how her insurance will play a role and will have to approve. Patient asked appropriate questions.   Employment status:  Retired Forensic scientist:  Managed Care PT Recommendations:  [place] / Referral to community resources:     Patient/Family's Response to care:  Patient expressed appreciation for CSW assistance.  Patient/Family's Understanding of and Emotional Response to Diagnosis, Current Treatment, and Prognosis:  Patient is aware of her limitations and believes that she will benefit from rehab.  Emotional Assessment Appearance:  Appears stated age Attitude/Demeanor/Rapport:    (pleasant and cooperative) Affect (typically observed):  Accepting, Adaptable, Calm, Appropriate Orientation:  Oriented to Self, Oriented to Place, Oriented to  Time, Oriented to Situation Alcohol / Substance use:  Not Applicable Psych involvement (Current and /or in the community):  No (Comment)  Discharge Needs  Concerns to be addressed:  Care Coordination Readmission within the last 30 days:  No Current discharge risk:  None Barriers to Discharge:  No Barriers Identified   Shela Leff, LCSW 11/11/2016, 10:39 AM

## 2016-11-12 LAB — GLUCOSE, CAPILLARY
GLUCOSE-CAPILLARY: 148 mg/dL — AB (ref 65–99)
GLUCOSE-CAPILLARY: 153 mg/dL — AB (ref 65–99)
GLUCOSE-CAPILLARY: 135 mg/dL — AB (ref 65–99)
Glucose-Capillary: 136 mg/dL — ABNORMAL HIGH (ref 65–99)

## 2016-11-12 MED ORDER — METRONIDAZOLE 500 MG PO TABS
500.0000 mg | ORAL_TABLET | Freq: Three times a day (TID) | ORAL | Status: DC
  Administered 2016-11-12 – 2016-11-14 (×6): 500 mg via ORAL
  Filled 2016-11-12 (×8): qty 1

## 2016-11-12 MED ORDER — CIPROFLOXACIN HCL 500 MG PO TABS
500.0000 mg | ORAL_TABLET | Freq: Two times a day (BID) | ORAL | Status: DC
  Administered 2016-11-12 – 2016-11-14 (×5): 500 mg via ORAL
  Filled 2016-11-12 (×5): qty 1

## 2016-11-12 NOTE — Clinical Social Work Note (Addendum)
Patient's husband arrived to hospital to visit his wife and began cursing and yelling when his wife told him that she had chosen [employer] [facility]. CSW met with patient and her husband this afternoon and patient's husband immediately began yelling at [place] and stating "who the hell chose [facility], I'm not sending her there." Patient's husband continued to speak with a raised voice and stated that [employer]uermann was not primary and that [employer] was primary. Patient's husband had a letter that he pulled out and stated "here is a letter showing [employer] is primary." CSW reviewed the letter and it actually stated that [employer] advantage was not primary and stated that patient had another plan that would use it's benefits first before [employer]. CSW read it out loud to patient's husband. Patient's husband stated, "well then I am cancelling [employer] on Monday." CSW explained that patient had two bed offers and informed him of which two. Patient's husband chose [facility]. CSW has notified [place] to cancel their prior auth and CSW has notified [facility] to begin prior [place] with [employer]. CSW has notified the RN Case Manager at [facility] [facility]: Christia Reading: [phone] ext: [phone] and Butch Penny stated she had just given prior auth to [facility] so she will cancel that request and begin working on auth for [facility]. CSW has arranged for patient to discharge on Saturday with a 5 day letter of guarantee.  Shela Leff MSW,LCSW [phone]

## 2016-11-12 NOTE — Consult Note (Signed)
[place] Nurse ostomy follow up Patient to be discharged to SNF.  3 pouches and barrier rings left with patient to take with her tomorrow.   Patient states she has no further questions regarding ostomy care at this time.  Brown stool noted in pouch today.  [place] team will follow through discharge.  Domenic Moras RN BSN [place] Pager [phone]

## 2016-11-12 NOTE — Clinical Social Work Note (Signed)
Bed offers have been extended to patient and she has chosen [facility]. [facility] is aware and is beginning [place] with [place].  Shela Leff MSW,LCSW [phone]

## 2016-11-12 NOTE — Progress Notes (Signed)
Physical Therapy Treatment Patient Details Name: Kara [name] MRN: [id number] DOB: 1951/02/25 Today's Date: 11/12/2016    History of Present Illness This patient with a history of acute diverticulitis with microperforation who was doing well until yesterday morning. She developed lower abdominal pain which has gradually worsened she's had no fevers or chills but has vomited one time. This is very similar in character to her prior diverticulitis. She is required to prior admissions for IV antibiotics recently. I had seen the patient in the office last week at which time her symptoms were completely resolved. This represents a new finding.    PT Comments    Pt progressing slowly toward her goals. Upon arrival to room, pt complained of nausea and stated she had just moved back into the bed from the chair with nursing staff after receiving nausea medication. Pt required several rest breaks during supine there-ex due to fatigue and SOB. Pt educated on pursed lip breathing throughout today's session. Supine to/from sit required min assist, and pt able to tolerate 20min at EOB before requesting to lay back down due to abdominal pain. Pt refused to attempt transfer to chair this PM. Pt educated on importance of getting out of bed to prevent further progression of weakness and decreased tolerance of upright position. Will continue to progress.    Follow Up Recommendations  SNF     Equipment Recommendations  Rolling walker with 5" wheels    Recommendations for Other Services       Precautions / Restrictions Precautions Precautions: Fall Restrictions Weight Bearing Restrictions: No    Mobility  Bed Mobility Overal bed mobility: Needs Assistance Bed Mobility: Supine to Sit;Sit to Supine     Supine to sit: Min assist Sit to supine: Min assist   General bed mobility comments: Pt demonstrated improved technique and no impulsiveness transferring from supine to/from sit this session.  She required min assist for both transfers. Pt able to scoot to EOB to allow her feet to touch the floor with verbal cueing only. Pt continued to be nauseous once EOB.   Transfers                 General transfer comment: Not attempted due to pt's nausea and abdomen pain.  Ambulation/Gait             General Gait Details: Not attempted due to pt's nausea and abdomen pain.   Stairs            Wheelchair Mobility    Modified Rankin (Stroke Patients Only)       Balance Overall balance assessment: Needs assistance Sitting-balance support: Single extremity supported;Feet supported (Pt leaned to R side due to L abdomen pain.)   Sitting balance - Comments: Pt sat EOB for 76min with feet supported and R UE support prior to requesting to lay down due to L abdomen pain. Pt's nausea persisted through today's session and intensified once EOB.                                    Cognition Arousal/Alertness: Awake/alert Behavior During Therapy: WFL for tasks assessed/performed Overall Cognitive Status: Within Functional Limits for tasks assessed                                        Exercises Other Exercises  Other Exercises: Supine ther-ex x10 B included: ankle pumps (x15), SLR's, hip abd, heel slides, and resisted elbow flex/ext. SLR's required mod assist due to fatigue, all other ther-ex performed with supervision and verbal cueing for sequencing and breathing techniques. Pt SOB and required several rest breaks during ther-ex due to fatigue.    General Comments        Pertinent Vitals/Pain Pain Assessment: Faces Faces Pain Scale: Hurts little more Pain Location: abdomen Pain Descriptors / Indicators: Operative site guarding;Aching;Grimacing Pain Intervention(s): Limited activity within patient's tolerance;Monitored during session;Repositioned    Home Living                      Prior Function            PT Goals  (current goals can now be found in the care plan section) Acute Rehab PT Goals Patient Stated Goal: to be able to walk PT Goal Formulation: With patient Time For Goal Achievement: 11/26/16 Progress towards PT goals: Progressing toward goals    Frequency    Min 2X/week      PT Plan Current plan remains appropriate    Co-evaluation              AM-PAC PT "6 Clicks" Daily Activity  Outcome Measure  Difficulty turning over in bed (including adjusting bedclothes, sheets and blankets)?: A Little Difficulty moving from lying on back to sitting on the side of the bed? : Total Difficulty sitting down on and standing up from a chair with arms (e.g., wheelchair, bedside commode, etc,.)?: Total Help needed moving to and from a bed to chair (including a wheelchair)?: A Lot Help needed walking in hospital room?: Total Help needed climbing 3-5 steps with a railing? : Total 6 Click Score: 9    End of Session   Activity Tolerance: Patient limited by fatigue;Patient limited by pain Patient left: in bed;with bed alarm set;with call bell/phone within reach Nurse Communication: Mobility status PT Visit Diagnosis: Pain;Muscle weakness (generalized) (M62.81);Unsteadiness on feet (R26.81) Pain - Right/Left: Left Pain - part of body:  (abdomen)     Time: 4536-4680 PT Time Calculation (min) (ACUTE ONLY): 18 min  Charges:                       G Codes:       Donaciano Eva, PT, SPT  [place] 11/12/2016, 2:02 PM

## 2016-11-12 NOTE — Progress Notes (Signed)
10 Days Post-Op  Subjective: Patient feels well today she is status post Hartman's procedure for perforated diverticulitis. Her colostomy is functioning well and putting out a lot of stool now. She has no abdominal pain no nausea vomiting no fevers or chills and is tolerating a regular diet  Objective: Vital signs in last 24 hours: Temp:  [98 F (36.7 C)-98.5 F (36.9 C)] 98.1 F (36.7 C) (06/22 0642) Pulse Rate:  [93-102] 101 (06/22 0642) Resp:  [17-19] 17 (06/21 2116) BP: (120-128)/(60-64) 128/60 (06/22 0642) SpO2:  [88 %-94 %] 94 % (06/22 0642) Last BM Date: 11/07/16  Intake/Output from previous day: 06/21 0701 - 06/22 0700 In: 3134.3 [P.O.:200; I.V.:2734.3; IV Piggyback:200] Out: 260 [Drains:85; Stool:175] Intake/Output this shift: No intake/output data recorded.  Physical exam:  Slight tachycardia but afebrile awake alert and oriented and comfortable.  Abdomen is obese soft nontender wound is clean no erythema no drainage serous drainage only in JP. That drain is removed. Ostomy is putting out large amounts of brown stool. Calves are nontender  Lab Results: CBC   Recent Labs  11/10/16 0422  WBC 6.8  HGB 12.5  HCT 36.0  PLT 297   BMET  Recent Labs  11/10/16 0422  NA 139  K 3.0*  CL 104  CO2 28  GLUCOSE 112*  BUN 6  CREATININE 0.55  CALCIUM 7.6*   PT/INR No results for input(s): LABPROT, INR in the last 72 hours. ABG No results for input(s): PHART, HCO3 in the last 72 hours.  Invalid input(s): PCO2, PO2  Studies/Results: No results found.  Anti-infectives: Anti-infectives    Start     Dose/Rate Route Frequency Ordered Stop   11/01/16 1600  ciprofloxacin (CIPRO) IVPB 400 mg     400 mg 200 mL/hr over 60 Minutes Intravenous Every 12 hours 11/01/16 1208     11/01/16 1300  metroNIDAZOLE (FLAGYL) IVPB 500 mg     500 mg 100 mL/hr over 60 Minutes Intravenous Every 8 hours 11/01/16 1208     11/01/16 0315  ciprofloxacin (CIPRO) IVPB 400 mg     400  mg 200 mL/hr over 60 Minutes Intravenous  Once 11/01/16 0311 11/01/16 0450   11/01/16 0315  metroNIDAZOLE (FLAGYL) IVPB 500 mg  Status:  Discontinued     500 mg 100 mL/hr over 60 Minutes Intravenous  Once 11/01/16 0311 11/01/16 1208      Assessment/Plan: s/p Procedure(s): COLON RESECTION SIGMOID COLOSTOMY   Patient is doing quite well at this point I suspect she will be able to go to a skilled nursing facility tomorrow. We will transition her to oral antibiotics today as well.  Florene Glen, MD, FACS  11/12/2016

## 2016-11-13 ENCOUNTER — Encounter
Admission: EM | Disposition: A | Discharge status: Home / Self Care | Payer: Self-pay | Source: Home / Self Care | Attending: Surgery

## 2016-11-13 ENCOUNTER — Inpatient Hospital Stay: Payer: Managed Care, Other (non HMO) | Admitting: Anesthesiology

## 2016-11-13 ENCOUNTER — Encounter: Payer: Self-pay | Admitting: Anesthesiology

## 2016-11-13 HISTORY — PX: SIGMOIDOSCOPY: SHX6686

## 2016-11-13 LAB — BASIC METABOLIC PANEL
ANION GAP: 5 (ref 5–15)
BUN: 5 mg/dL — ABNORMAL LOW (ref 6–20)
CALCIUM: 7.6 mg/dL — AB (ref 8.9–10.3)
CO2: 31 mmol/L (ref 22–32)
Chloride: 102 mmol/L (ref 101–111)
Creatinine, Ser: 0.62 mg/dL (ref 0.44–1.00)
GLUCOSE: 148 mg/dL — AB (ref 65–99)
Potassium: 2.9 mmol/L — ABNORMAL LOW (ref 3.5–5.1)
SODIUM: 138 mmol/L (ref 135–145)

## 2016-11-13 LAB — CBC WITH DIFFERENTIAL/PLATELET
BASOS PCT: 0 %
Basophils Absolute: 0 10*3/uL (ref 0–0.1)
EOS ABS: 0.1 10*3/uL (ref 0–0.7)
EOS PCT: 1 %
HCT: 35.7 % (ref 35.0–47.0)
Hemoglobin: 12.2 g/dL (ref 12.0–16.0)
Lymphocytes Relative: 13 %
Lymphs Abs: 1.3 10*3/uL (ref 1.0–3.6)
MCH: 30.8 pg (ref 26.0–34.0)
MCHC: 34.1 g/dL (ref 32.0–36.0)
MCV: 90.2 fL (ref 80.0–100.0)
MONO ABS: 1.1 10*3/uL — AB (ref 0.2–0.9)
Monocytes Relative: 11 %
Neutro Abs: 7.5 10*3/uL — ABNORMAL HIGH (ref 1.4–6.5)
Neutrophils Relative %: 75 %
PLATELETS: 314 10*3/uL (ref 150–440)
RBC: 3.95 MIL/uL (ref 3.80–5.20)
RDW: 13.9 % (ref 11.5–14.5)
WBC: 10 10*3/uL (ref 3.6–11.0)

## 2016-11-13 LAB — GLUCOSE, CAPILLARY
GLUCOSE-CAPILLARY: 124 mg/dL — AB (ref 65–99)
GLUCOSE-CAPILLARY: 130 mg/dL — AB (ref 65–99)
GLUCOSE-CAPILLARY: 147 mg/dL — AB (ref 65–99)
Glucose-Capillary: 167 mg/dL — ABNORMAL HIGH (ref 65–99)

## 2016-11-13 SURGERY — SIGMOIDOSCOPY
Anesthesia: Monitor Anesthesia Care | Wound class: Contaminated

## 2016-11-13 MED ORDER — PROPOFOL 10 MG/ML IV BOLUS
INTRAVENOUS | Status: DC | PRN
  Administered 2016-11-13 (×3): 50 mg via INTRAVENOUS

## 2016-11-13 MED ORDER — POTASSIUM CHLORIDE CRYS ER 20 MEQ PO TBCR
20.0000 meq | EXTENDED_RELEASE_TABLET | Freq: Two times a day (BID) | ORAL | Status: DC
  Administered 2016-11-13 – 2016-11-14 (×3): 20 meq via ORAL
  Filled 2016-11-13 (×3): qty 1

## 2016-11-13 MED ORDER — GLYCOPYRROLATE 0.2 MG/ML IV SOSY
PREFILLED_SYRINGE | INTRAVENOUS | Status: DC | PRN
  Administered 2016-11-13: .2 mg via INTRAVENOUS

## 2016-11-13 MED ORDER — POTASSIUM CHLORIDE 10 MEQ/100ML IV SOLN
10.0000 meq | Freq: Once | INTRAVENOUS | Status: AC
  Administered 2016-11-13: 10 meq via INTRAVENOUS
  Filled 2016-11-13: qty 100

## 2016-11-13 SURGICAL SUPPLY — 28 items
BLADE SURG 11 STRL SS SAFETY (MISCELLANEOUS) IMPLANT
BLADE SURG 15 STRL SS SAFETY (BLADE) IMPLANT
BRIEF STRETCH MATERNITY 2XLG (MISCELLANEOUS) IMPLANT
CANISTER SUCT 1200ML W/VALVE (MISCELLANEOUS) IMPLANT
DRAPE LAPAROTOMY 77X122 PED (DRAPES) IMPLANT
DRAPE LEGGINS SURG 28X43 STRL (DRAPES) IMPLANT
DRAPE UNDER BUTTOCK W/FLU (DRAPES) IMPLANT
ELECT REM PT RETURN 9FT ADLT (ELECTROSURGICAL)
ELECTRODE REM PT RTRN 9FT ADLT (ELECTROSURGICAL) IMPLANT
GLOVE BIO SURGEON STRL SZ 6.5 (GLOVE) ×4 IMPLANT
GLOVE BIO SURGEON STRL SZ7 (GLOVE) IMPLANT
GLOVE BIO SURGEONS STRL SZ 6.5 (GLOVE) ×2
GLOVE SKINSENSE N 8.5 STRL (GLOVE) ×6 IMPLANT
GOWN STRL REUS W/ TWL LRG LVL3 (GOWN DISPOSABLE) ×2 IMPLANT
GOWN STRL REUS W/TWL LRG LVL3 (GOWN DISPOSABLE) ×4
KIT RM TURNOVER CYSTO AR (KITS) ×3 IMPLANT
LABEL OR SOLS (LABEL) IMPLANT
NEEDLE HYPO 25X1 1.5 SAFETY (NEEDLE) IMPLANT
PACK BASIN MINOR ARMC (MISCELLANEOUS) ×3 IMPLANT
PAD OB MATERNITY 4.3X12.25 (PERSONAL CARE ITEMS) IMPLANT
PAD PREP 24X41 OB/GYN DISP (PERSONAL CARE ITEMS) IMPLANT
POUCH DRAIN  2 1/4 MED RED 181 (OSTOMY) ×3 IMPLANT
SOL PREP PVP 2OZ (MISCELLANEOUS)
SOLUTION PREP PVP 2OZ (MISCELLANEOUS) IMPLANT
SURGILUBE 2OZ TUBE FLIPTOP (MISCELLANEOUS) ×3 IMPLANT
SUT VIC AB 3-0 SH 27 (SUTURE)
SUT VIC AB 3-0 SH 27X BRD (SUTURE) IMPLANT
SYR CONTROL 10ML (SYRINGE) IMPLANT

## 2016-11-13 NOTE — Anesthesia Post-op Follow-up Note (Cosign Needed)
Anesthesia QCDR form completed.        

## 2016-11-13 NOTE — Anesthesia Preprocedure Evaluation (Signed)
Anesthesia Evaluation  Patient identified by MRN, date of birth, ID band Patient awake    Reviewed: Allergy & Precautions, H&P , NPO status , Patient's Chart, lab work & pertinent test results  History of Anesthesia Complications Negative for: history of anesthetic complications  Airway Mallampati: III  TM Distance: <3 FB Neck ROM: limited    Dental  (+) Poor Dentition, Chipped, Missing   Pulmonary neg pulmonary ROS, neg shortness of breath,    Pulmonary exam normal breath sounds clear to auscultation       Cardiovascular Exercise Tolerance: Good hypertension, (-) angina(-) Past MI and (-) DOE Normal cardiovascular exam Rhythm:regular Rate:Normal     Neuro/Psych negative neurological ROS  negative psych ROS   GI/Hepatic negative GI ROS, Neg liver ROS, neg GERD  ,  Endo/Other  diabetes, Type 2  Renal/GU negative Renal ROS  negative genitourinary   Musculoskeletal  (+) Arthritis ,   Abdominal   Peds  Hematology negative hematology ROS (+)   Anesthesia Other Findings Signs and symptoms suggestive of sleep apnea   Past Medical History: No date: Diabetes mellitus without complication (HCC) No date: Hypertension  Past Surgical History: No date: CARPAL TUNNEL RELEASE Bilateral 11/02/2016: COLON RESECTION SIGMOID N/A     Comment: Procedure: COLON RESECTION SIGMOID;  Surgeon:               [name], MD;  Location: [name] ORS;                Service: General;  Laterality: N/A; 11/02/2016: COLOSTOMY N/A     Comment: Procedure: COLOSTOMY;  Surgeon: [name], MD;  Location: [name] ORS;  Service:               General;  Laterality: N/A; No date: KNEE SURGERY     Comment: torn menicus  BMI    Body Mass Index:  41.27 kg/m      Reproductive/Obstetrics negative OB ROS                             Anesthesia Physical Anesthesia Plan  ASA: III  Anesthesia Plan:  General   Post-op Pain Management:    Induction: Intravenous  PONV Risk Score and Plan: 2 and Ondansetron and Dexamethasone  Airway Management Planned: Natural Airway and Nasal Cannula  Additional Equipment:   Intra-op Plan:   Post-operative Plan:   Informed Consent: I have reviewed the patients History and Physical, chart, labs and discussed the procedure including the risks, benefits and alternatives for the proposed anesthesia with the patient or authorized representative who has indicated his/her understanding and acceptance.   Dental Advisory Given  Plan Discussed with: Anesthesiologist, CRNA and Surgeon  Anesthesia Plan Comments: (Patient consented for risks of anesthesia including but not limited to:  - adverse reactions to medications - damage to teeth, lips or other oral mucosa - sore throat or hoarseness - Damage to heart, brain, lungs or loss of life  Patient voiced understanding.)        Anesthesia Quick Evaluation

## 2016-11-13 NOTE — Op Note (Signed)
11/01/2016 - 11/13/2016  1:41 PM  PATIENT:  Kara [name]  66 y.o. female  PRE-OPERATIVE DIAGNOSIS: Ischemic colostomy  POST-OPERATIVE DIAGNOSIS:  Same  PROCEDURE: Flexible endoscopy of ischemic colostomy  SURGEON:  Florene Glen MD, FACS   ANESTHESIA:   Monitored anesthesia care   Details of Procedure: This patient status post Hartman's procedure for perforated diverticulitis. She has experienced an ischemic colostomy that is functional. Concern for depth of ischemia needs to be addressed. We discussed the rationale for offering endoscopy of the colostomy to assess this. She understood and agreed to proceed  Findings: Ischemic and necrotic ostomy at the skin level but pinked approximately 2 cm from the skin edge. Definitely viable above the fascia.  Description of procedure patient was induced to monitored anesthesia care and a surgical timeout was held. A digital exam of the colostomy was performed to determine direction to the fascia. And then the flexible sigmoidoscope was advanced through the necrotic opening of the ostomy to the fascia. There was a demarcation at approximately 2 cm from the skin edge well above the fascia which was at approximately 8-10 cm. There was no sign of full-thickness necrosis below that 1-2 cm mark.  Patient tolerated the procedure well and was taken to the recovery room in stable condition to be admitted for continued care.   Florene Glen, MD FACS

## 2016-11-13 NOTE — Anesthesia Postprocedure Evaluation (Signed)
Anesthesia Post Note  Patient: Kara [name]  Procedure(s) Performed: Procedure(s) (LRB): endoscopic  flexible SIGMOIDOSCOPY (N/A)  Patient location during evaluation: PACU Anesthesia Type: MAC Level of consciousness: awake and alert Pain management: pain level controlled Vital Signs Assessment: post-procedure vital signs reviewed and stable Respiratory status: spontaneous breathing, nonlabored ventilation, respiratory function stable and patient connected to nasal cannula oxygen Cardiovascular status: blood pressure returned to baseline and stable Postop Assessment: no signs of nausea or vomiting Anesthetic complications: no     Last Vitals:  Vitals:   11/13/16 1404 11/13/16 1422  BP: (!) 141/81 125/66  Pulse: 91 94  Resp: (!) 23 14  Temp: 36.8 C 36.8 C    Last Pain:  Vitals:   11/13/16 1422  TempSrc: Oral  PainSc:                  Precious Haws Reshanda Lewey

## 2016-11-13 NOTE — Transfer of Care (Signed)
Immediate Anesthesia Transfer of Care Note  Patient: Kara [name]  Procedure(s) Performed: Procedure(s): endoscopic  flexible SIGMOIDOSCOPY (N/A)  Patient Location: PACU  Anesthesia Type:MAC  Level of Consciousness: awake and alert   Airway & Oxygen Therapy: Patient Spontanous Breathing and Patient connected to face mask oxygen  Post-op Assessment: Report given to RN  Post vital signs: Reviewed and stable  Last Vitals:  Vitals:   11/12/16 2059 11/13/16 0530  BP: 125/66 122/61  Pulse: 95 90  Resp: 18 16  Temp: 36.8 C 37.1 C    Last Pain:  Vitals:   11/13/16 0842  TempSrc:   PainSc: 0-No pain      Patients Stated Pain Goal: 2 (33/29/51 8841)  Complications: No apparent anesthesia complications

## 2016-11-13 NOTE — Progress Notes (Signed)
11 Days Post-Op  Subjective: Patient feels well has no nausea vomiting no abdominal pain and has good ostomy output  Objective: Vital signs in last 24 hours: Temp:  [98.2 F (36.8 C)-98.8 F (37.1 C)] 98.8 F (37.1 C) (06/23 0530) Pulse Rate:  [90-95] 90 (06/23 0530) Resp:  [16-18] 16 (06/23 0530) BP: (122-125)/(61-66) 122/61 (06/23 0530) SpO2:  [92 %-94 %] 92 % (06/23 0530) Last BM Date: 11/12/16  Intake/Output from previous day: 06/22 0701 - 06/23 0700 In: 2634 [P.O.:120; I.V.:2514] Out: 1200 [Stool:1200] Intake/Output this shift: No intake/output data recorded.  Physical exam:  Awake alert and oriented vital signs are stable and afebrile abdomen is soft nondistended nontympanitic and nontender wound is clean no erythema no drainage ostomy is dusky and frankly necrotic but there is good ostomy output. Calves are nontender.  Lab Results: CBC   Recent Labs  11/13/16 0306  WBC 10.0  HGB 12.2  HCT 35.7  PLT 314   BMET  Recent Labs  11/13/16 0306  NA 138  K 2.9*  CL 102  CO2 31  GLUCOSE 148*  BUN 5*  CREATININE 0.62  CALCIUM 7.6*   PT/INR No results for input(s): LABPROT, INR in the last 72 hours. ABG No results for input(s): PHART, HCO3 in the last 72 hours.  Invalid input(s): PCO2, PO2  Studies/Results: No results found.  Anti-infectives: Anti-infectives    Start     Dose/Rate Route Frequency Ordered Stop   11/12/16 1400  metroNIDAZOLE (FLAGYL) tablet 500 mg     500 mg Oral Every 8 hours 11/12/16 0751     11/12/16 1200  ciprofloxacin (CIPRO) tablet 500 mg     500 mg Oral 2 times daily 11/12/16 0751     11/01/16 1600  ciprofloxacin (CIPRO) IVPB 400 mg  Status:  Discontinued     400 mg 200 mL/hr over 60 Minutes Intravenous Every 12 hours 11/01/16 1208 11/12/16 0751   11/01/16 1300  metroNIDAZOLE (FLAGYL) IVPB 500 mg  Status:  Discontinued     500 mg 100 mL/hr over 60 Minutes Intravenous Every 8 hours 11/01/16 1208 11/12/16 0751   11/01/16 0315   ciprofloxacin (CIPRO) IVPB 400 mg     400 mg 200 mL/hr over 60 Minutes Intravenous  Once 11/01/16 0311 11/01/16 0450   11/01/16 0315  metroNIDAZOLE (FLAGYL) IVPB 500 mg  Status:  Discontinued     500 mg 100 mL/hr over 60 Minutes Intravenous  Once 11/01/16 0311 11/01/16 1208      Assessment/Plan: s/p Procedure(s): [place] are reviewed normal with the exception of the potassium which is being replaced. My biggest concern in this patient is that of the necrotic colostomy. I have performed a limited endoscopy with a glass tube and determined that the ischemia earlier in the week was definitely in the subcutaneous tissue. I could not assess the area of the fascia. With that in mind my concern is that this could be subfascial although she is not ill at this time transferred to a skilled nursing facility without this being answered could be catastrophic. I discussed with the patient my suggestion of performing an examination under anesthesia with a more formal endoscopy of the colostomy and the subcutaneous tissue to the level of the fascia. I suggested that this could be done under IV sedation only without a full general anesthetic. Depending on the findings of this she may require revision of the colostomy but transfer of the patient to a skilled nerve  pacing facility at this time without daily evaluation of his ostomy would be very dangerous. I discussed with the patient the need for ostomy evaluation under IV sedation with a formal rigid ostomy scope and she was understanding of this plan.  Florene Glen, MD, FACS  11/13/2016

## 2016-11-14 ENCOUNTER — Encounter: Payer: Self-pay | Admitting: Surgery

## 2016-11-14 LAB — BASIC METABOLIC PANEL
Anion gap: 6 (ref 5–15)
CALCIUM: 7.5 mg/dL — AB (ref 8.9–10.3)
CO2: 30 mmol/L (ref 22–32)
Chloride: 104 mmol/L (ref 101–111)
Creatinine, Ser: 0.64 mg/dL (ref 0.44–1.00)
GFR calc Af Amer: >60 mL/min (ref >60)
GLUCOSE: 187 mg/dL — AB (ref 65–99)
Potassium: 3.1 mmol/L — ABNORMAL LOW (ref 3.5–5.1)
Sodium: 140 mmol/L (ref 135–145)

## 2016-11-14 LAB — CBC WITH DIFFERENTIAL/PLATELET
BASOS ABS: 0 10*3/uL (ref 0–0.1)
Basophils Relative: 0 %
EOS ABS: 0.1 10*3/uL (ref 0–0.7)
Eosinophils Relative: 1 %
HCT: 35.3 % (ref 35.0–47.0)
Hemoglobin: 12 g/dL (ref 12.0–16.0)
LYMPHS ABS: 1 10*3/uL (ref 1.0–3.6)
LYMPHS PCT: 9 %
MCH: 30.6 pg (ref 26.0–34.0)
MCHC: 33.8 g/dL (ref 32.0–36.0)
MCV: 90.5 fL (ref 80.0–100.0)
MONO ABS: 1.1 10*3/uL — AB (ref 0.2–0.9)
Monocytes Relative: 11 %
Neutro Abs: 8.2 10*3/uL — ABNORMAL HIGH (ref 1.4–6.5)
Neutrophils Relative %: 79 %
Platelets: 367 10*3/uL (ref 150–440)
RBC: 3.9 MIL/uL (ref 3.80–5.20)
RDW: 14 % (ref 11.5–14.5)
WBC: 10.4 10*3/uL (ref 3.6–11.0)

## 2016-11-14 LAB — GLUCOSE, CAPILLARY
Glucose-Capillary: 153 mg/dL — ABNORMAL HIGH (ref 65–99)
Glucose-Capillary: 179 mg/dL — ABNORMAL HIGH (ref 65–99)

## 2016-11-14 MED ORDER — ENSURE ENLIVE PO LIQD: 237.0000 mL | Freq: Three times a day (TID) | ORAL | 12 refills | Status: DC

## 2016-11-14 MED ORDER — HYDROCODONE-ACETAMINOPHEN 5-300 MG PO TABS: 1.0000 | ORAL_TABLET | ORAL | 0 refills | Status: DC | PRN

## 2016-11-14 MED ORDER — CIPROFLOXACIN HCL 500 MG PO TABS: 500.0000 mg | ORAL_TABLET | Freq: Two times a day (BID) | ORAL | 1 refills | Status: DC

## 2016-11-14 MED ORDER — METRONIDAZOLE 500 MG PO TABS: 500.0000 mg | ORAL_TABLET | Freq: Three times a day (TID) | ORAL | 1 refills | Status: DC

## 2016-11-14 NOTE — Clinical Social Work Note (Signed)
Patient will discharge today to [facility] via non-emergent EMS. The patient and the facility are aware and in agreement. The documentation has been sent to the facility, and the packet has been delivered to the chart. The attending RN is aware. CSW will continue to follow pending additional discharge needs.  Santiago Bumpers, MSW, Latanya Presser [phone]

## 2016-11-14 NOTE — Progress Notes (Signed)
1 Day Post-Op  Subjective: Patient experienced 9 out of 10 pain last night which was unusual as she had been pain-free for several days. This following a endoscopy under monitored anesthesia care in the operating room yesterday that ostomy endoscopy failed to identify deep ischemia of the colostomy.  Today she states that her pain is completely gone she is back to his 0 out of 10 compared to the 9 out of 10 last night. She attributes this to cramping pain last night only. She's had no nausea vomiting. In fact as I entered the room she was on the telephone with dietary ordering a full regular diet and states that she is hungry.  Objective: Vital signs in last 24 hours: Temp:  [97.5 F (36.4 C)-98.5 F (36.9 C)] 97.9 F (36.6 C) (06/24 0604) Pulse Rate:  [89-101] 101 (06/24 0604) Resp:  [14-31] 18 (06/24 0604) BP: (116-141)/(47-81) 116/47 (06/24 0604) SpO2:  [90 %-96 %] 90 % (06/24 0604) Last BM Date: 11/12/16  Intake/Output from previous day: 06/23 0701 - 06/24 0700 In: 978 [P.O.:180; I.V.:798] Out: -  Intake/Output this shift: No intake/output data recorded.  Physical exam:  Patient is afebrile and appears quite comfortable but her most recent heart rate was 101. Blood pressure is normal. Abdomen is soft nondistended nontympanitic and nontender there is ostomy output. No erythema no drainage from wound  Lab Results: CBC   Recent Labs  11/13/16 0306 11/14/16 0346  WBC 10.0 10.4  HGB 12.2 12.0  HCT 35.7 35.3  PLT 314 367   BMET  Recent Labs  11/13/16 0306 11/14/16 0346  NA 138 140  K 2.9* 3.1*  CL 102 104  CO2 31 30  GLUCOSE 148* 187*  BUN 5* <5*  CREATININE 0.62 0.64  CALCIUM 7.6* 7.5*   PT/INR No results for input(s): LABPROT, INR in the last 72 hours. ABG No results for input(s): PHART, HCO3 in the last 72 hours.  Invalid input(s): PCO2, PO2  Studies/Results: No results found.  Anti-infectives: Anti-infectives    Start     Dose/Rate Route  Frequency Ordered Stop   11/14/16 0000  ciprofloxacin (CIPRO) 500 MG tablet     500 mg Oral 2 times daily 11/14/16 0839     11/14/16 0000  metroNIDAZOLE (FLAGYL) 500 MG tablet     500 mg Oral Every 8 hours 11/14/16 0839     11/12/16 1400  metroNIDAZOLE (FLAGYL) tablet 500 mg     500 mg Oral Every 8 hours 11/12/16 0751     11/12/16 1200  ciprofloxacin (CIPRO) tablet 500 mg     500 mg Oral 2 times daily 11/12/16 0751     11/01/16 1600  ciprofloxacin (CIPRO) IVPB 400 mg  Status:  Discontinued     400 mg 200 mL/hr over 60 Minutes Intravenous Every 12 hours 11/01/16 1208 11/12/16 0751   11/01/16 1300  metroNIDAZOLE (FLAGYL) IVPB 500 mg  Status:  Discontinued     500 mg 100 mL/hr over 60 Minutes Intravenous Every 8 hours 11/01/16 1208 11/12/16 0751   11/01/16 0315  ciprofloxacin (CIPRO) IVPB 400 mg     400 mg 200 mL/hr over 60 Minutes Intravenous  Once 11/01/16 0311 11/01/16 0450   11/01/16 0315  metroNIDAZOLE (FLAGYL) IVPB 500 mg  Status:  Discontinued     500 mg 100 mL/hr over 60 Minutes Intravenous  Once 11/01/16 0311 11/01/16 1208      Assessment/Plan: s/p Procedure(s): endoscopic  flexible SIGMOIDOSCOPY   This a patient who experienced  cramping abdominal pain last night which was new for her as she had been pain-free for several days. That pain is completely gone at this point and she is hungry and is tolerating a regular diet. She wants to go to a care center today. I would like to see her next set of vital signs to ensure that she is not remaining tachycardic now that her pain is completely gone.  Endoscopy of her ostomy yesterday showed that there was no deep ischemia and that there was viable tissue to the fascia. I believe the patient could go to the care Center provided she tolerates her breakfast has no return of that pain and her heart rate comes back to normal. Transfer to a skilled nursing facility either today or tomorrow based on her performance this morning. This was  discussed with nursing and with the patient.  Florene Glen, MD, FACS  11/14/2016

## 2016-11-14 NOTE — Progress Notes (Signed)
Report called to RN at [facility]. Questions were encouraged. RN inquired about stop date for antibiotics. Dr. Burt Knack will decide on stop date once patient is seen in the office for follow up. [facility] is present to transport patient to [facility] at this time.

## 2016-11-14 NOTE — Discharge Summary (Signed)
Physician Discharge Summary  Patient ID: Kara [name] MRN: [id number] DOB/AGE: [birth year] 65 y.o.  Admit date: 11/01/2016 Discharge date: 11/14/2016   Discharge Diagnoses:  Active Problems:   Diverticulitis   Procedures:Hartman's procedure with drainage of abscess, endoscopy of colostomy  Hospital Course: This a patient with known diverticular disease who had been in the hospital multiple times with diverticulitis. She presented back to the hospital with worsening pain and fevers and a CT scan suggested worsening perforation. She was taken the operating room where an abscess in the pelvis was drained and Hartman's procedure was performed. The end colostomy was matured.  Postoperatively he was noted that her colostomy was ischemic and ultimately necrotic. Class tube endoscopy was performed multiple times showing what appeared to be ischemic necrosis that was very superficial but deep to the skin and the colostomy was viable. The colostomy became functional with good stool output. Prior to transfer to the skilled nursing facility on Saturday, June 23 she was taken to the operating room where flexible endoscopy was performed to get a better view of the mucosa of the and colostomy. It was noted at this time that the colostomy was frankly necrotic at the skin level but beneath the skin proximally 2 cm down a became viable and was certainly viable above the fascia. Good stool output remained.  The patient is tolerating a regular diet. Her ostomy is functional and she will be transferred today to a skilled nursing facility. Her JP drain has been removed. She will undergo ongoing rehabilitation at the skilled nursing facility. Half of her staples have been removed. She will follow-up with me midweek or towards the end of the week to remove the remainder of her staples. Should she experience any sort of fevers or abdominal pain that is persistent she would need to come back to the emergency room  for reevaluation of her stoma.  Patient may shower and undergo routine colostomy care.  Consults: Social services  Disposition: 01-Home or Self Care   Allergies as of 11/14/2016      Reactions   Latex       Medication List    TAKE these medications   aspirin EC 81 MG tablet Take 81 mg by mouth daily.   ciprofloxacin 500 MG tablet Commonly known as:  CIPRO Take 1 tablet (500 mg total) by mouth 2 (two) times daily.   cyclobenzaprine 10 MG tablet Commonly known as:  FLEXERIL Take 10 mg by mouth daily as needed.   diclofenac 75 MG EC tablet Commonly known as:  VOLTAREN Take 75 mg by mouth daily.   feeding supplement (ENSURE ENLIVE) Liqd Take 237 mLs by mouth 3 (three) times daily between meals.   glipiZIDE 5 MG tablet Commonly known as:  GLUCOTROL Take 0.5 tablets (2.5 mg total) by mouth daily before breakfast.   Hydrocodone-Acetaminophen 5-300 MG Tabs Commonly known as:  VICODIN Take 1 tablet by mouth every 4 (four) hours as needed.   losartan 50 MG tablet Commonly known as:  COZAAR Take 50 mg by mouth daily.   metroNIDAZOLE 500 MG tablet Commonly known as:  FLAGYL Take 1 tablet (500 mg total) by mouth every 8 (eight) hours.   MULTI-VITAMINS Tabs Take 1 tablet by mouth daily.       Contact information for follow-up providers    Florene Glen, MD Follow up in 5 day(s).   Specialty:  Surgery Contact information: [address] [place] [address] [phone]  Contact information for after-discharge care    [facility] Follow up.   Specialty:  [place] information: [place]                  Florene Glen, MD, FACS

## 2016-11-14 NOTE — Discharge Instructions (Signed)
May shower Routine ostomy care Follow-up Dr. Burt Knack in 5 days Staples superior removed in 5 days Resume home medications Take oral antibiotics and analgesics as prescribed

## 2016-11-14 NOTE — Progress Notes (Signed)
Pt noted that she had no pain this AM. Gas can be heard coming from the colostomy. Pt noted she was feeling much better than last PM.

## 2016-11-14 NOTE — Progress Notes (Addendum)
Py complained of  new onset of severe, sharp, stabbing pain in the lower abd this shift. Pt's pain had been controlled by Norco 5-325 on previous shift. Pt noted that pain seem to start since her procedure on previous shift, (Flexible endoscopy of ischemic colostomy). Dilaudid 1 mg admin x 3 on this shift. MD notified of change in condition, advised to continue to monitor. No output from colostomy this shift.

## 2016-11-15 DIAGNOSIS — R600 Localized edema: Secondary | ICD-10-CM | POA: Insufficient documentation

## 2016-11-15 DIAGNOSIS — K5792 Diverticulitis of intestine, part unspecified, without perforation or abscess without bleeding: Secondary | ICD-10-CM | POA: Diagnosis not present

## 2016-11-15 DIAGNOSIS — Z933 Colostomy status: Secondary | ICD-10-CM | POA: Diagnosis not present

## 2016-11-15 DIAGNOSIS — E119 Type 2 diabetes mellitus without complications: Secondary | ICD-10-CM | POA: Diagnosis not present

## 2016-11-16 ENCOUNTER — Other Ambulatory Visit: Payer: Self-pay

## 2016-11-17 ENCOUNTER — Encounter: Payer: Self-pay | Admitting: *Deleted

## 2016-11-17 ENCOUNTER — Ambulatory Visit: Payer: Self-pay | Admitting: Surgery

## 2016-11-17 ENCOUNTER — Emergency Department: Payer: Managed Care, Other (non HMO)

## 2016-11-17 ENCOUNTER — Inpatient Hospital Stay
Admission: EM | Admit: 2016-11-17 | Discharge: 2016-11-22 | DRG: 163 | Disposition: A | Discharge status: Home / Self Care | Payer: Managed Care, Other (non HMO) | Attending: Specialist | Admitting: Specialist

## 2016-11-17 DIAGNOSIS — I82403 Acute embolism and thrombosis of unspecified deep veins of lower extremity, bilateral: Secondary | ICD-10-CM | POA: Diagnosis present

## 2016-11-17 DIAGNOSIS — I110 Hypertensive heart disease with heart failure: Secondary | ICD-10-CM | POA: Diagnosis present

## 2016-11-17 DIAGNOSIS — Z9981 Dependence on supplemental oxygen: Secondary | ICD-10-CM

## 2016-11-17 DIAGNOSIS — N390 Urinary tract infection, site not specified: Secondary | ICD-10-CM | POA: Diagnosis present

## 2016-11-17 DIAGNOSIS — R0602 Shortness of breath: Secondary | ICD-10-CM | POA: Diagnosis not present

## 2016-11-17 DIAGNOSIS — R609 Edema, unspecified: Secondary | ICD-10-CM

## 2016-11-17 DIAGNOSIS — Z933 Colostomy status: Secondary | ICD-10-CM

## 2016-11-17 DIAGNOSIS — Z7982 Long term (current) use of aspirin: Secondary | ICD-10-CM

## 2016-11-17 DIAGNOSIS — J9621 Acute and chronic respiratory failure with hypoxia: Secondary | ICD-10-CM

## 2016-11-17 DIAGNOSIS — D649 Anemia, unspecified: Secondary | ICD-10-CM | POA: Diagnosis present

## 2016-11-17 DIAGNOSIS — Z91040 Latex allergy status: Secondary | ICD-10-CM

## 2016-11-17 DIAGNOSIS — Z79899 Other long term (current) drug therapy: Secondary | ICD-10-CM

## 2016-11-17 DIAGNOSIS — J810 Acute pulmonary edema: Secondary | ICD-10-CM

## 2016-11-17 DIAGNOSIS — J189 Pneumonia, unspecified organism: Secondary | ICD-10-CM | POA: Diagnosis present

## 2016-11-17 DIAGNOSIS — I2699 Other pulmonary embolism without acute cor pulmonale: Secondary | ICD-10-CM | POA: Diagnosis not present

## 2016-11-17 DIAGNOSIS — I5023 Acute on chronic systolic (congestive) heart failure: Secondary | ICD-10-CM | POA: Diagnosis present

## 2016-11-17 DIAGNOSIS — Z4802 Encounter for removal of sutures: Secondary | ICD-10-CM

## 2016-11-17 DIAGNOSIS — E119 Type 2 diabetes mellitus without complications: Secondary | ICD-10-CM | POA: Diagnosis present

## 2016-11-17 DIAGNOSIS — R0600 Dyspnea, unspecified: Secondary | ICD-10-CM | POA: Diagnosis not present

## 2016-11-17 DIAGNOSIS — K5792 Diverticulitis of intestine, part unspecified, without perforation or abscess without bleeding: Secondary | ICD-10-CM | POA: Diagnosis present

## 2016-11-17 DIAGNOSIS — E876 Hypokalemia: Secondary | ICD-10-CM | POA: Diagnosis present

## 2016-11-17 DIAGNOSIS — J9601 Acute respiratory failure with hypoxia: Secondary | ICD-10-CM | POA: Diagnosis present

## 2016-11-17 HISTORY — DX: Type 2 diabetes mellitus without complications: E11.9

## 2016-11-17 HISTORY — DX: Diverticulitis of intestine, part unspecified, without perforation or abscess without bleeding: K57.92

## 2016-11-17 HISTORY — DX: Colostomy status: Z93.3

## 2016-11-17 LAB — BASIC METABOLIC PANEL
ANION GAP: 6 (ref 5–15)
BUN: 5 mg/dL — ABNORMAL LOW (ref 6–20)
CHLORIDE: 107 mmol/L (ref 101–111)
CO2: 30 mmol/L (ref 22–32)
Calcium: 8.2 mg/dL — ABNORMAL LOW (ref 8.9–10.3)
Creatinine, Ser: 0.66 mg/dL (ref 0.44–1.00)
GFR calc non Af Amer: >60 mL/min (ref >60)
Glucose, Bld: 131 mg/dL — ABNORMAL HIGH (ref 65–99)
Potassium: 2.9 mmol/L — ABNORMAL LOW (ref 3.5–5.1)
Sodium: 143 mmol/L (ref 135–145)

## 2016-11-17 LAB — CBC
HCT: 36.3 % (ref 35.0–47.0)
HEMOGLOBIN: 12.4 g/dL (ref 12.0–16.0)
MCH: 30.8 pg (ref 26.0–34.0)
MCHC: 34.1 g/dL (ref 32.0–36.0)
MCV: 90.4 fL (ref 80.0–100.0)
Platelets: 405 10*3/uL (ref 150–440)
RBC: 4.01 MIL/uL (ref 3.80–5.20)
RDW: 14.1 % (ref 11.5–14.5)
WBC: 6.9 10*3/uL (ref 3.6–11.0)

## 2016-11-17 LAB — TROPONIN I: Troponin I: <0.03 ng/mL (ref <0.03)

## 2016-11-17 LAB — BRAIN NATRIURETIC PEPTIDE: B Natriuretic Peptide: 60 pg/mL (ref 0.0–100.0)

## 2016-11-17 MED ORDER — POTASSIUM CHLORIDE CRYS ER 20 MEQ PO TBCR
40.0000 meq | EXTENDED_RELEASE_TABLET | Freq: Once | ORAL | Status: AC
  Administered 2016-11-18: 40 meq via ORAL
  Filled 2016-11-17: qty 2

## 2016-11-17 MED ORDER — FUROSEMIDE 10 MG/ML IJ SOLN
20.0000 mg | Freq: Once | INTRAMUSCULAR | Status: AC
  Administered 2016-11-18: 20 mg via INTRAVENOUS
  Filled 2016-11-17: qty 4

## 2016-11-17 NOTE — ED Triage Notes (Signed)
Pt discharged on Sunday from having a colectomy, pt here via EMS from [facility] with shortness of breath, pt reports symptoms started today with fluid retention, pt was started on Lasix Monday, pt alert and oriented talking in full sentences

## 2016-11-17 NOTE — ED Provider Notes (Signed)
[facility] Emergency Department Provider Note   ____________________________________________   First MD Initiated Contact with Patient 11/17/16 2333     (approximate)  I have reviewed the triage vital signs and the nursing notes.   HISTORY  Chief Complaint Shortness of Breath    HPI Kara [name] is a 66 y.o. female brought to the ED from [facility] via EMS with a chief complaint of shortness of breath. Patient was discharged recently status post colectomy for diverticulitis. Developed shortness of breath and coughing starting 6/25 along with bilateral lower leg edema; started on 20 mg oral Lasix. Does not usually wear oxygen and had to be placed on 4 L nasal cannula auction today by nursing staff. Complains of shortness of breath especially on exertion and bilateral lower leg swelling. Denies associated fever, chills, chest pain, abdominal pain, nausea, vomiting. Denies recent travel or trauma.   Past Medical History:  Diagnosis Date  . Diabetes mellitus without complication ([place])   . Hypertension     Patient Active Problem List   Diagnosis Date Noted  . Acute on chronic systolic (congestive) heart failure ([place]) 11/18/2016  . Colostomy in place [facility]) 11/15/2016  . Edema of both legs 11/15/2016  . Morbidly obese ([place]) 11/15/2016  . Diverticulitis 10/03/2016  . Diverticulitis large intestine 09/29/2016  . Benign essential hypertension 08/26/2016  . DDD (degenerative disc disease), lumbar 07/14/2016  . Acute non-recurrent maxillary sinusitis 05/21/2016  . Acute hip pain, left 02/23/2016  . Chronic low back pain without sciatica 11/14/2015  . Cough 07/30/2015  . Nocturnal leg cramps 02/20/2015  . Diabetes mellitus type 2 in obese ([place]) 11/12/2014  . Type 2 diabetes mellitus without complication, without long-term current use of insulin ([place]) 11/12/2014  . BP (high blood pressure) 11/12/2014    Past Surgical History:  Procedure  Laterality Date  . CARPAL TUNNEL RELEASE Bilateral   . COLON RESECTION SIGMOID N/A 11/02/2016   Procedure: COLON RESECTION SIGMOID;  Surgeon: [name], MD;  Location: [name] ORS;  Service: General;  Laterality: N/A;  . COLOSTOMY N/A 11/02/2016   Procedure: COLOSTOMY;  Surgeon: [name], MD;  Location: [name] ORS;  Service: General;  Laterality: N/A;  . KNEE SURGERY     torn menicus  . SIGMOIDOSCOPY N/A 11/13/2016   Procedure: endoscopic  flexible SIGMOIDOSCOPY;  Surgeon: Florene Glen, MD;  Location: [name] ORS;  Service: General;  Laterality: N/A;    Prior to Admission medications   Medication Sig Start Date End Date Taking? Authorizing Provider  aspirin EC 81 MG tablet Take 81 mg by mouth daily.   Yes [provider]  ciprofloxacin (CIPRO) 500 MG tablet Take 1 tablet (500 mg total) by mouth 2 (two) times daily. 11/14/16  Yes Florene Glen, MD  cyclobenzaprine (FLEXERIL) 10 MG tablet Take 10 mg by mouth daily as needed. 07/05/16  Yes [provider]  diclofenac (VOLTAREN) 75 MG EC tablet Take 75 mg by mouth daily. 07/05/16  Yes [provider]  feeding supplement, ENSURE ENLIVE, (ENSURE ENLIVE) LIQD Take 237 mLs by mouth 3 (three) times daily between meals. 11/14/16  Yes Florene Glen, MD  glipiZIDE (GLUCOTROL) 5 MG tablet Take 0.5 tablets (2.5 mg total) by mouth daily before breakfast. 02/04/16  Yes Roselee Nova, MD  Hydrocodone-Acetaminophen (VICODIN) 5-300 MG TABS Take 1 tablet by mouth every 4 (four) hours as needed. 11/14/16  Yes Florene Glen, MD  losartan (COZAAR) 50 MG tablet Take 1 tablet  by mouth daily. 11/02/16 11/02/17 Yes [provider]  metroNIDAZOLE (FLAGYL) 500 MG tablet Take 1 tablet (500 mg total) by mouth every 8 (eight) hours. 11/14/16  Yes Florene Glen, MD  Multiple Vitamin (MULTI-VITAMINS) TABS Take 1 tablet by mouth daily.   Yes [provider]    Allergies Latex  Family History  Problem  Relation Age of Onset  . Diabetes Mother   . Heart disease Mother   . Cancer Mother   . Heart disease Father   . Diabetes Sister   . Heart disease Sister     Social History Social History  Substance Use Topics  . Smoking status: Never Smoker  . Smokeless tobacco: Never Used  . Alcohol use No    Review of Systems  Constitutional: No fever/chills. Eyes: No visual changes. ENT: No sore throat. Cardiovascular: Denies chest pain. Respiratory: Positive for shortness of breath. Gastrointestinal: No abdominal pain.  No nausea, no vomiting.  No diarrhea.  No constipation. Genitourinary: Negative for dysuria. Musculoskeletal: Positive for BLE swelling. Negative for back pain. Skin: Negative for rash. Neurological: Negative for headaches, focal weakness or numbness.   ____________________________________________   PHYSICAL EXAM:  VITAL SIGNS: ED Triage Vitals  Enc Vitals Group     BP 11/17/16 2310 (!) 141/76     Pulse Rate 11/17/16 2310 91     Resp 11/17/16 2310 20     Temp 11/17/16 2310 98.2 F (36.8 C)     Temp Source 11/17/16 2310 Oral     SpO2 11/17/16 2310 96 %     Weight 11/17/16 2317 233 lb (105.7 kg)     Height 11/17/16 2317 5\' 3"  (1.6 m)     Head Circumference --      Peak Flow --      Pain Score --      Pain Loc --      Pain Edu? --      Excl. in [place]? --     Constitutional: Alert and oriented. Well appearing and in mild acute distress. Eyes: Conjunctivae are normal. PERRL. EOMI. Head: Atraumatic. Nose: No congestion/rhinnorhea. Mouth/Throat: Mucous membranes are moist.  Oropharynx non-erythematous. Neck: No stridor.   Cardiovascular: Normal rate, regular rhythm. Grossly normal heart sounds.  Good peripheral circulation. Respiratory: Normal respiratory effort.  No retractions. Lungs with bilateral rales. Gastrointestinal: Obese. Soft and nontender. Colostomy with firm stool. Incision site C/D/I. No distention. No abdominal bruits. No CVA  tenderness. Musculoskeletal: No lower extremity tenderness. 1+ pitting BLE edema.  No joint effusions. Neurologic:  Normal speech and language. No gross focal neurologic deficits are appreciated.  Skin:  Skin is warm, dry and intact. No rash noted. Psychiatric: Mood and affect are normal. Speech and behavior are normal.  ____________________________________________   LABS (all labs ordered are listed, but only abnormal results are displayed)  Labs Reviewed  BASIC METABOLIC PANEL - Abnormal; Notable for the following:       Result Value   Potassium 2.9 (*)    Glucose, Bld 131 (*)    BUN 5 (*)    Calcium 8.2 (*)    All other components within normal limits  CULTURE, BLOOD (ROUTINE X 2)  CULTURE, BLOOD (ROUTINE X 2)  CBC  TROPONIN I  BRAIN NATRIURETIC PEPTIDE   ____________________________________________  EKG  ED ECG REPORT I, Ivah Girardot J, the attending physician, personally viewed and interpreted this ECG.   Date: 11/18/2016  EKG Time: 2315  Rate: 89  Rhythm: normal EKG, normal sinus rhythm  Axis: Normal  Intervals:none  ST&T Change: Nonspecific  ____________________________________________  RADIOLOGY  Dg Chest 2 View  Result Date: 11/17/2016 CLINICAL DATA:  Shortness of breath EXAM: CHEST  2 VIEW COMPARISON:  08/24/2016 FINDINGS: Low lung volumes with slightly elevated right diaphragm. Probable small pleural effusions on the lateral view. Atelectasis or infiltrate at the left lung base. Stable cardiomediastinal silhouette. No pneumothorax. IMPRESSION: Low lung volumes. Mild focal opacity at the left lung base may reflect atelectasis or a pneumonia. Probable small pleural effusions. Electronically Signed   By: Donavan Foil M.D.   On: 11/17/2016 23:59    ____________________________________________   PROCEDURES  Procedure(s) performed: None  Procedures  Critical Care performed: No  ____________________________________________   INITIAL IMPRESSION /  ASSESSMENT AND PLAN / ED COURSE  Pertinent labs & imaging results that were available during my care of the patient were reviewed by me and considered in my medical decision making (see chart for details).  66 year old female recently status post colectomy with cough, shortness of breath, now requiring oxygen. Rales heard on exam. Chest x-ray demonstrates pleural effusions and likely pneumonia. Will obtain blood cultures, start IV antibiotics for HCAP, diuresis. Will discuss with hospitalist to evaluate patient in the emergency department for admission.      ____________________________________________   FINAL CLINICAL IMPRESSION(S) / ED DIAGNOSES  Final diagnoses:  SOB (shortness of breath)  Shortness of breath  Acute pulmonary edema (HCC)  Hypokalemia  HCAP (healthcare-associated pneumonia)      NEW MEDICATIONS STARTED DURING THIS VISIT:  New Prescriptions   No medications on file     Note:  This document was prepared using Dragon voice recognition software and may include unintentional dictation errors.    Paulette Blanch, MD 11/18/16 [phone]

## 2016-11-18 ENCOUNTER — Inpatient Hospital Stay
Admit: 2016-11-18 | Discharge: 2016-11-18 | Disposition: A | Discharge status: Home / Self Care | Payer: Managed Care, Other (non HMO) | Attending: Internal Medicine | Admitting: Internal Medicine

## 2016-11-18 ENCOUNTER — Inpatient Hospital Stay: Payer: Managed Care, Other (non HMO)

## 2016-11-18 ENCOUNTER — Ambulatory Visit: Payer: Self-pay | Admitting: Surgery

## 2016-11-18 DIAGNOSIS — M62838 Other muscle spasm: Secondary | ICD-10-CM | POA: Diagnosis not present

## 2016-11-18 DIAGNOSIS — Z433 Encounter for attention to colostomy: Secondary | ICD-10-CM | POA: Diagnosis not present

## 2016-11-18 DIAGNOSIS — I2699 Other pulmonary embolism without acute cor pulmonale: Secondary | ICD-10-CM | POA: Diagnosis not present

## 2016-11-18 DIAGNOSIS — Z7401 Bed confinement status: Secondary | ICD-10-CM | POA: Diagnosis not present

## 2016-11-18 DIAGNOSIS — R0602 Shortness of breath: Secondary | ICD-10-CM | POA: Diagnosis not present

## 2016-11-18 DIAGNOSIS — I5031 Acute diastolic (congestive) heart failure: Secondary | ICD-10-CM | POA: Diagnosis not present

## 2016-11-18 DIAGNOSIS — K5790 Diverticulosis of intestine, part unspecified, without perforation or abscess without bleeding: Secondary | ICD-10-CM | POA: Diagnosis not present

## 2016-11-18 DIAGNOSIS — Z9981 Dependence on supplemental oxygen: Secondary | ICD-10-CM | POA: Diagnosis not present

## 2016-11-18 DIAGNOSIS — J9601 Acute respiratory failure with hypoxia: Secondary | ICD-10-CM | POA: Diagnosis present

## 2016-11-18 DIAGNOSIS — R6889 Other general symptoms and signs: Secondary | ICD-10-CM | POA: Diagnosis not present

## 2016-11-18 DIAGNOSIS — I10 Essential (primary) hypertension: Secondary | ICD-10-CM | POA: Diagnosis not present

## 2016-11-18 DIAGNOSIS — K5792 Diverticulitis of intestine, part unspecified, without perforation or abscess without bleeding: Secondary | ICD-10-CM | POA: Diagnosis present

## 2016-11-18 DIAGNOSIS — I5023 Acute on chronic systolic (congestive) heart failure: Secondary | ICD-10-CM | POA: Diagnosis present

## 2016-11-18 DIAGNOSIS — J189 Pneumonia, unspecified organism: Secondary | ICD-10-CM | POA: Diagnosis present

## 2016-11-18 DIAGNOSIS — Z933 Colostomy status: Secondary | ICD-10-CM | POA: Diagnosis not present

## 2016-11-18 DIAGNOSIS — I82409 Acute embolism and thrombosis of unspecified deep veins of unspecified lower extremity: Secondary | ICD-10-CM | POA: Diagnosis not present

## 2016-11-18 DIAGNOSIS — I5032 Chronic diastolic (congestive) heart failure: Secondary | ICD-10-CM | POA: Diagnosis not present

## 2016-11-18 DIAGNOSIS — Z7984 Long term (current) use of oral hypoglycemic drugs: Secondary | ICD-10-CM | POA: Diagnosis not present

## 2016-11-18 DIAGNOSIS — E876 Hypokalemia: Secondary | ICD-10-CM | POA: Diagnosis present

## 2016-11-18 DIAGNOSIS — Z7901 Long term (current) use of anticoagulants: Secondary | ICD-10-CM | POA: Diagnosis not present

## 2016-11-18 DIAGNOSIS — Z4802 Encounter for removal of sutures: Secondary | ICD-10-CM

## 2016-11-18 DIAGNOSIS — Z7982 Long term (current) use of aspirin: Secondary | ICD-10-CM | POA: Diagnosis not present

## 2016-11-18 DIAGNOSIS — Z48815 Encounter for surgical aftercare following surgery on the digestive system: Secondary | ICD-10-CM | POA: Diagnosis not present

## 2016-11-18 DIAGNOSIS — I82403 Acute embolism and thrombosis of unspecified deep veins of lower extremity, bilateral: Secondary | ICD-10-CM | POA: Diagnosis present

## 2016-11-18 DIAGNOSIS — E119 Type 2 diabetes mellitus without complications: Secondary | ICD-10-CM | POA: Diagnosis not present

## 2016-11-18 DIAGNOSIS — I110 Hypertensive heart disease with heart failure: Secondary | ICD-10-CM | POA: Diagnosis not present

## 2016-11-18 DIAGNOSIS — Z79899 Other long term (current) drug therapy: Secondary | ICD-10-CM | POA: Diagnosis not present

## 2016-11-18 DIAGNOSIS — N390 Urinary tract infection, site not specified: Secondary | ICD-10-CM | POA: Diagnosis present

## 2016-11-18 DIAGNOSIS — Z91040 Latex allergy status: Secondary | ICD-10-CM | POA: Diagnosis not present

## 2016-11-18 DIAGNOSIS — D649 Anemia, unspecified: Secondary | ICD-10-CM | POA: Diagnosis present

## 2016-11-18 LAB — TSH: TSH: 1.212 u[IU]/mL (ref 0.350–4.500)

## 2016-11-18 LAB — MAGNESIUM: Magnesium: 2 mg/dL (ref 1.7–2.4)

## 2016-11-18 LAB — POTASSIUM: POTASSIUM: 3.8 mmol/L (ref 3.5–5.1)

## 2016-11-18 MED ORDER — ASPIRIN EC 81 MG PO TBEC
81.0000 mg | DELAYED_RELEASE_TABLET | Freq: Every day | ORAL | Status: DC
  Administered 2016-11-18 – 2016-11-22 (×5): 81 mg via ORAL
  Filled 2016-11-18 (×5): qty 1

## 2016-11-18 MED ORDER — ONDANSETRON HCL 4 MG PO TABS: 4.0000 mg | ORAL_TABLET | Freq: Four times a day (QID) | ORAL | Status: DC | PRN

## 2016-11-18 MED ORDER — ACETAMINOPHEN 650 MG RE SUPP: 650.0000 mg | Freq: Four times a day (QID) | RECTAL | Status: DC | PRN

## 2016-11-18 MED ORDER — VANCOMYCIN HCL IN DEXTROSE 1-5 GM/200ML-% IV SOLN
1000.0000 mg | Freq: Two times a day (BID) | INTRAVENOUS | Status: DC
  Administered 2016-11-18: 1000 mg via INTRAVENOUS
  Filled 2016-11-18 (×3): qty 200

## 2016-11-18 MED ORDER — ONDANSETRON HCL 4 MG/2ML IJ SOLN
4.0000 mg | Freq: Four times a day (QID) | INTRAMUSCULAR | Status: DC | PRN
  Administered 2016-11-22: 4 mg via INTRAVENOUS
  Filled 2016-11-18: qty 2

## 2016-11-18 MED ORDER — VANCOMYCIN HCL IN DEXTROSE 1-5 GM/200ML-% IV SOLN
1000.0000 mg | Freq: Once | INTRAVENOUS | Status: AC
  Administered 2016-11-18: 1000 mg via INTRAVENOUS
  Filled 2016-11-18: qty 200

## 2016-11-18 MED ORDER — HYDROCODONE-ACETAMINOPHEN 5-325 MG PO TABS
1.0000 | ORAL_TABLET | ORAL | Status: DC | PRN
  Administered 2016-11-20 – 2016-11-22 (×9): 1 via ORAL
  Filled 2016-11-18 (×9): qty 1

## 2016-11-18 MED ORDER — DEXTROSE 5 % IV SOLN
1.0000 g | Freq: Three times a day (TID) | INTRAVENOUS | Status: DC
  Administered 2016-11-18: 1 g via INTRAVENOUS
  Filled 2016-11-18 (×4): qty 1

## 2016-11-18 MED ORDER — LOSARTAN POTASSIUM 50 MG PO TABS
50.0000 mg | ORAL_TABLET | Freq: Every day | ORAL | Status: DC
  Administered 2016-11-18 – 2016-11-22 (×5): 50 mg via ORAL
  Filled 2016-11-18 (×5): qty 1

## 2016-11-18 MED ORDER — ENOXAPARIN SODIUM 40 MG/0.4ML ~~LOC~~ SOLN
40.0000 mg | Freq: Two times a day (BID) | SUBCUTANEOUS | Status: DC
  Administered 2016-11-18: 40 mg via SUBCUTANEOUS
  Filled 2016-11-18: qty 0.4

## 2016-11-18 MED ORDER — ADULT MULTIVITAMIN W/MINERALS CH
1.0000 | ORAL_TABLET | Freq: Every day | ORAL | Status: DC
  Administered 2016-11-18 – 2016-11-22 (×5): 1 via ORAL
  Filled 2016-11-18 (×5): qty 1

## 2016-11-18 MED ORDER — PIPERACILLIN-TAZOBACTAM 3.375 G IVPB 30 MIN
3.3750 g | Freq: Once | INTRAVENOUS | Status: AC
  Administered 2016-11-18: 3.375 g via INTRAVENOUS
  Filled 2016-11-18: qty 50

## 2016-11-18 MED ORDER — METRONIDAZOLE 500 MG PO TABS
500.0000 mg | ORAL_TABLET | Freq: Three times a day (TID) | ORAL | Status: DC
  Administered 2016-11-18: 500 mg via ORAL
  Filled 2016-11-18 (×3): qty 1

## 2016-11-18 MED ORDER — POTASSIUM CHLORIDE CRYS ER 20 MEQ PO TBCR
40.0000 meq | EXTENDED_RELEASE_TABLET | ORAL | Status: DC
Start: 2016-11-18 — End: 2016-11-18
  Administered 2016-11-18 (×2): 40 meq via ORAL
  Filled 2016-11-18 (×2): qty 2

## 2016-11-18 MED ORDER — POTASSIUM CHLORIDE CRYS ER 20 MEQ PO TBCR
20.0000 meq | EXTENDED_RELEASE_TABLET | Freq: Two times a day (BID) | ORAL | Status: DC
  Administered 2016-11-18: 20 meq via ORAL
  Filled 2016-11-18: qty 1

## 2016-11-18 MED ORDER — ENOXAPARIN SODIUM 60 MG/0.6ML ~~LOC~~ SOLN
60.0000 mg | Freq: Once | SUBCUTANEOUS | Status: AC
  Administered 2016-11-18: 60 mg via SUBCUTANEOUS
  Filled 2016-11-18: qty 0.6

## 2016-11-18 MED ORDER — FUROSEMIDE 10 MG/ML IJ SOLN
20.0000 mg | Freq: Four times a day (QID) | INTRAMUSCULAR | Status: DC
  Administered 2016-11-18: 20 mg via INTRAVENOUS
  Filled 2016-11-18: qty 4

## 2016-11-18 MED ORDER — IOPAMIDOL (ISOVUE-370) INJECTION 76%
75.0000 mL | Freq: Once | INTRAVENOUS | Status: AC | PRN
Start: 2016-11-18 — End: 2016-11-18
  Administered 2016-11-18: 75 mL via INTRAVENOUS

## 2016-11-18 MED ORDER — ENSURE ENLIVE PO LIQD
237.0000 mL | Freq: Three times a day (TID) | ORAL | Status: DC
  Administered 2016-11-18 – 2016-11-22 (×4): 237 mL via ORAL

## 2016-11-18 MED ORDER — ENOXAPARIN SODIUM 120 MG/0.8ML ~~LOC~~ SOLN
1.0000 mg/kg | Freq: Two times a day (BID) | SUBCUTANEOUS | Status: DC
  Administered 2016-11-18 – 2016-11-21 (×6): 105 mg via SUBCUTANEOUS
  Filled 2016-11-18 (×7): qty 0.8

## 2016-11-18 MED ORDER — DOCUSATE SODIUM 100 MG PO CAPS
100.0000 mg | ORAL_CAPSULE | Freq: Two times a day (BID) | ORAL | Status: DC
  Administered 2016-11-18 – 2016-11-22 (×8): 100 mg via ORAL
  Filled 2016-11-18 (×9): qty 1

## 2016-11-18 MED ORDER — CYCLOBENZAPRINE HCL 10 MG PO TABS
10.0000 mg | ORAL_TABLET | Freq: Every day | ORAL | Status: DC | PRN
  Administered 2016-11-20 – 2016-11-22 (×3): 10 mg via ORAL
  Filled 2016-11-18 (×6): qty 1

## 2016-11-18 MED ORDER — FUROSEMIDE 10 MG/ML IJ SOLN: 20.0000 mg | Freq: Two times a day (BID) | INTRAMUSCULAR | Status: DC

## 2016-11-18 MED ORDER — ACETAMINOPHEN 325 MG PO TABS: 650.0000 mg | ORAL_TABLET | Freq: Four times a day (QID) | ORAL | Status: DC | PRN

## 2016-11-18 NOTE — Progress Notes (Signed)
Initial Nutrition Assessment  DOCUMENTATION CODES:   Morbid obesity  INTERVENTION:   Ensure Enlive po BID, each supplement provides 350 kcal and 20 grams of protein  MVI  NUTRITION DIAGNOSIS:   Increased nutrient needs related to acute illness (PNA, recenet colstomy r/t diverticulitis with perf) as evidenced by increased estimated needs from protein.  GOAL:   Patient will meet greater than or equal to 90% of their needs  MONITOR:   PO intake, Supplement acceptance, Labs, Weight trends  REASON FOR ASSESSMENT:   Malnutrition Screening Tool    ASSESSMENT:   66 year old female with PMHx of DM, HTN, acute diverticulitis with microperforation now s/p sigmoid colectomy with Hartman's end colostomy on 11/02/2016. Admitted today with PNA and UTI   Met with pt in room today. Pt reports good appetite since previous discharge. Pt currently eating 100% of meals and drinking Ensure. Per chart, pt has lost 22lbs(9%) in 2.5 weeks; suspect this is related to fluid changes. Pt reports good ostomy output from bag today with minimal gas. Pt had education during last admit regarding ostomy nutrition. Pt with hypokalemia; monitor and supplement as needed per MD discretion.   Medications reviewed and include: aspirin, colace, lovenox, metronidazole, MVI, KCl, cefepime, vancomycin  Labs reviewed: K 2.9(L), BUN 5(L), Ca 8.2(L), Mg 2.0 wnl  Nutrition-Focused physical exam completed. Findings are no fat depletion, no muscle depletion, and moderate edema.   Diet Order:  Diet heart healthy/carb modified Room service appropriate? Yes; Fluid consistency: Thin; Fluid restriction: 1500 mL Fluid  Skin:  Wound (see comment) (incision abdomen )  Last BM:  colostomy   Height:   Ht Readings from Last 1 Encounters:  11/17/16 _0  (1.6 m)    Weight:   Wt Readings from Last 1 Encounters:  11/17/16 233 lb (105.7 kg)    Ideal Body Weight:  52.3 kg  BMI:  Body mass index is 41.27 kg/m.  Estimated  Nutritional Needs:   Kcal:  1700-2000kcal/day   Protein:  106-127g/day   Fluid:  >1.7L/day   EDUCATION NEEDS:   Education needs addressed  Koleen Distance MS, RD, LDN Pager #[phone] After Hours Pager: [phone]

## 2016-11-18 NOTE — NC FL2 (Signed)
[place] LEVEL OF CARE SCREENING TOOL     IDENTIFICATION  Patient Name: Kara [name] Birthdate: [birth year] Sex: female Admission Date (Current Location): 11/17/2016  [place] and [place] Number:  Engineering geologist and Address:  [facility], [address] [place], [place] [address]      Provider Number: [id number]  Attending Physician Name and Address:  Theodoro Grist, MD  Relative Name and Phone Number:       Current Level of Care: Hospital Recommended Level of Care: [place] Prior Approval Number:    Date Approved/Denied:   PASRR Number:    Discharge Plan: SNF    Current Diagnoses: Patient Active Problem List   Diagnosis Date Noted  . Acute on chronic systolic (congestive) heart failure ([place]) 11/18/2016  . HCAP (healthcare-associated pneumonia) 11/18/2016  . Encounter for removal of staples   . Colostomy in place [facility]) 11/15/2016  . Edema of both legs 11/15/2016  . Morbidly obese ([place]) 11/15/2016  . Diverticulitis 10/03/2016  . Diverticulitis large intestine 09/29/2016  . Benign essential hypertension 08/26/2016  . DDD (degenerative disc disease), lumbar 07/14/2016  . Acute non-recurrent maxillary sinusitis 05/21/2016  . Acute hip pain, left 02/23/2016  . Chronic low back pain without sciatica 11/14/2015  . Cough 07/30/2015  . Nocturnal leg cramps 02/20/2015  . Diabetes mellitus type 2 in obese ([place]) 11/12/2014  . Type 2 diabetes mellitus without complication, without long-term current use of insulin ([place]) 11/12/2014  . BP (high blood pressure) 11/12/2014    Orientation RESPIRATION BLADDER Height & Weight     Self, Time, Situation, Place  Normal, O2 (4 liters O2) Continent Weight: 211 lb 6.4 oz (95.9 kg) (bed weight) Height:  5\' 3"  (160 cm)  BEHAVIORAL SYMPTOMS/MOOD NEUROLOGICAL BOWEL NUTRITION STATUS   (none)  (none) Colostomy Diet (heart healthy carb modified)  AMBULATORY STATUS  COMMUNICATION OF NEEDS Skin   Limited Assist Verbally Normal                       Personal Care Assistance Level of Assistance  Bathing, Dressing Bathing Assistance: Limited assistance   Dressing Assistance: Limited assistance     Functional Limitations Info             SPECIAL CARE FACTORS FREQUENCY  PT (By licensed PT)                    Contractures Contractures Info: Not present    Additional Factors Info  Code Status, Allergies Code Status Info: full Allergies Info: latex           Current Medications (11/18/2016):  This is the current hospital active medication list Current Facility-Administered Medications  Medication Dose Route Frequency Provider Last Rate Last Dose  . acetaminophen (TYLENOL) tablet 650 mg  650 mg Oral Q6H PRN Harrie Foreman, MD       Or  . acetaminophen (TYLENOL) suppository 650 mg  650 mg Rectal Q6H PRN Harrie Foreman, MD      . aspirin EC tablet 81 mg  81 mg Oral Daily Harrie Foreman, MD   81 mg at 11/18/16 1039  . cyclobenzaprine (FLEXERIL) tablet 10 mg  10 mg Oral Daily PRN Harrie Foreman, MD      . docusate sodium (COLACE) capsule 100 mg  100 mg Oral BID Harrie Foreman, MD   100 mg at 11/18/16 1039  . enoxaparin (LOVENOX) injection 105 mg  1 mg/kg Subcutaneous Q12H Jani Ploeger, MD      . enoxaparin (LOVENOX) injection 60 mg  60 mg Subcutaneous Once Theodoro Grist, MD      . feeding supplement (ENSURE ENLIVE) (ENSURE ENLIVE) liquid 237 mL  237 mL Oral TID BM Harrie Foreman, MD   237 mL at 11/18/16 1509  . furosemide (LASIX) injection 20 mg  20 mg Intravenous BID Theodoro Grist, MD      . HYDROcodone-acetaminophen (NORCO/VICODIN) 5-325 MG per tablet 1 tablet  1 tablet Oral Q4H PRN Harrie Foreman, MD      . losartan (COZAAR) tablet 50 mg  50 mg Oral Daily Harrie Foreman, MD   50 mg at 11/18/16 1039  . multivitamin with minerals tablet 1 tablet  1 tablet Oral Daily Harrie Foreman, MD   1  tablet at 11/18/16 1038  . ondansetron (ZOFRAN) tablet 4 mg  4 mg Oral Q6H PRN Harrie Foreman, MD       Or  . ondansetron [facility]) injection 4 mg  4 mg Intravenous Q6H PRN Harrie Foreman, MD      . potassium chloride SA (K-DUR,KLOR-CON) CR tablet 20 mEq  20 mEq Oral BID Theodoro Grist, MD      . potassium chloride SA (K-DUR,KLOR-CON) CR tablet 40 mEq  40 mEq Oral Q4H Theodoro Grist, MD   40 mEq at 11/18/16 1155     Discharge Medications: Please see discharge summary for a list of discharge medications.  Relevant Imaging Results:  Relevant Lab Results:   Additional Information    Shela Leff, LCSW

## 2016-11-18 NOTE — H&P (Addendum)
Kara [name] is an 66 y.o. female.   Chief Complaint: Shortness of breath HPI: The patient with past medical history of hypertension, diabetes and recent diverticulitis with perforation status post partial colectomy presents to the emergency department with shortness of breath. The patient states that her dyspnea has worsened over the last 2 days. She began having swelling in her lower extremities 3 days ago. She was started on oral Lasix but this is not helped. Emergency department oxygen saturations were less than 88% on room air which required 4 L of oxygen via nasal cannula to maintain oxygen saturations greater than 94%. She denies chest pain, nausea or vomiting. The patient received Lasix 20 mg IV in the emergency department prior to the hospitalist service been called for admission.  Past Medical History:  Diagnosis Date  . Diabetes mellitus without complication ([place])   . Hypertension     Past Surgical History:  Procedure Laterality Date  . CARPAL TUNNEL RELEASE Bilateral   . COLON RESECTION SIGMOID N/A 11/02/2016   Procedure: COLON RESECTION SIGMOID;  Surgeon: [name], MD;  Location: [name] ORS;  Service: General;  Laterality: N/A;  . COLOSTOMY N/A 11/02/2016   Procedure: COLOSTOMY;  Surgeon: [name], MD;  Location: [name] ORS;  Service: General;  Laterality: N/A;  . KNEE SURGERY     torn menicus  . SIGMOIDOSCOPY N/A 11/13/2016   Procedure: endoscopic  flexible SIGMOIDOSCOPY;  Surgeon: Florene Glen, MD;  Location: [name] ORS;  Service: General;  Laterality: N/A;    Family History  Problem Relation Age of Onset  . Diabetes Mother   . Heart disease Mother   . Cancer Mother   . Heart disease Father   . Diabetes Sister   . Heart disease Sister    Social History:  reports that she has never smoked. She has never used smokeless tobacco. She reports that she does not drink alcohol or use drugs.  Allergies:  Allergies  Allergen Reactions  . Latex      Medications Prior to Admission  Medication Sig Dispense Refill  . aspirin EC 81 MG tablet Take 81 mg by mouth daily.    . ciprofloxacin (CIPRO) 500 MG tablet Take 1 tablet (500 mg total) by mouth 2 (two) times daily. 20 tablet 1  . cyclobenzaprine (FLEXERIL) 10 MG tablet Take 10 mg by mouth daily as needed.    . diclofenac (VOLTAREN) 75 MG EC tablet Take 75 mg by mouth daily.    . feeding supplement, ENSURE ENLIVE, (ENSURE ENLIVE) LIQD Take 237 mLs by mouth 3 (three) times daily between meals. 237 mL 12  . glipiZIDE (GLUCOTROL) 5 MG tablet Take 0.5 tablets (2.5 mg total) by mouth daily before breakfast. 90 tablet 0  . Hydrocodone-Acetaminophen (VICODIN) 5-300 MG TABS Take 1 tablet by mouth every 4 (four) hours as needed. 30 each 0  . losartan (COZAAR) 50 MG tablet Take 1 tablet by mouth daily.    . metroNIDAZOLE (FLAGYL) 500 MG tablet Take 1 tablet (500 mg total) by mouth every 8 (eight) hours. 30 tablet 1  . Multiple Vitamin (MULTI-VITAMINS) TABS Take 1 tablet by mouth daily.      Results for orders placed or performed during the hospital encounter of 11/17/16 (from the past 48 hour(s))  Basic metabolic panel     Status: Abnormal   Collection Time: 11/17/16 11:17 PM  Result Value Ref Range   Sodium 143 135 - 145 mmol/L   Potassium 2.9 (L) 3.5 - 5.1 mmol/L  Chloride 107 101 - 111 mmol/L   CO2 30 22 - 32 mmol/L   Glucose, Bld 131 (H) 65 - 99 mg/dL   BUN 5 (L) 6 - 20 mg/dL   Creatinine, Ser 0.66 0.44 - 1.00 mg/dL   Calcium 8.2 (L) 8.9 - 10.3 mg/dL   GFR calc non Af Amer >60 >60 mL/min   GFR calc Af Amer >60 >60 mL/min    Comment: (NOTE) The eGFR has been calculated using the CKD EPI equation. This calculation has not been validated in all clinical situations. eGFR's persistently <60 mL/min signify possible Chronic Kidney Disease.    Anion gap 6 5 - 15  CBC     Status: None   Collection Time: 11/17/16 11:17 PM  Result Value Ref Range   WBC 6.9 3.6 - 11.0 K/uL   RBC 4.01  3.80 - 5.20 MIL/uL   Hemoglobin 12.4 12.0 - 16.0 g/dL   HCT 36.3 35.0 - 47.0 %   MCV 90.4 80.0 - 100.0 fL   MCH 30.8 26.0 - 34.0 pg   MCHC 34.1 32.0 - 36.0 g/dL   RDW 14.1 11.5 - 14.5 %   Platelets 405 150 - 440 K/uL  Troponin I     Status: None   Collection Time: 11/17/16 11:17 PM  Result Value Ref Range   Troponin I <0.03 <0.03 ng/mL  Brain natriuretic peptide     Status: None   Collection Time: 11/17/16 11:17 PM  Result Value Ref Range   B Natriuretic Peptide 60.0 0.0 - 100.0 pg/mL  TSH     Status: None   Collection Time: 11/17/16 11:17 PM  Result Value Ref Range   TSH 1.212 0.350 - 4.500 uIU/mL    Comment: Performed by a 3rd Generation assay with a functional sensitivity of <=0.01 uIU/mL.  Culture, blood (routine x 2)     Status: None (Preliminary result)   Collection Time: 11/18/16 12:18 AM  Result Value Ref Range   Specimen Description BLOOD R AC    Special Requests      BOTTLES DRAWN AEROBIC AND ANAEROBIC Blood Culture adequate volume   Culture NO GROWTH < 12 HOURS    Report Status PENDING   Culture, blood (routine x 2)     Status: None (Preliminary result)   Collection Time: 11/18/16 12:18 AM  Result Value Ref Range   Specimen Description BLOOD LT HAND    Special Requests      BOTTLES DRAWN AEROBIC AND ANAEROBIC Blood Culture adequate volume   Culture NO GROWTH < 12 HOURS    Report Status PENDING    Dg Chest 2 View  Result Date: 11/17/2016 CLINICAL DATA:  Shortness of breath EXAM: CHEST  2 VIEW COMPARISON:  08/24/2016 FINDINGS: Low lung volumes with slightly elevated right diaphragm. Probable small pleural effusions on the lateral view. Atelectasis or infiltrate at the left lung base. Stable cardiomediastinal silhouette. No pneumothorax. IMPRESSION: Low lung volumes. Mild focal opacity at the left lung base may reflect atelectasis or a pneumonia. Probable small pleural effusions. Electronically Signed   By: Donavan Foil M.D.   On: 11/17/2016 23:59    Review of  Systems  Constitutional: Negative for chills and fever.  HENT: Negative for sore throat and tinnitus.   Eyes: Negative for blurred vision and redness.  Respiratory: Positive for shortness of breath. Negative for cough.   Cardiovascular: Negative for chest pain, palpitations, orthopnea and PND.  Gastrointestinal: Negative for abdominal pain, diarrhea, nausea and vomiting.  Genitourinary: Negative  for dysuria, frequency and urgency.  Musculoskeletal: Negative for joint pain and myalgias.  Skin: Negative for rash.       No lesions  Neurological: Negative for speech change, focal weakness and weakness.  Endo/Heme/Allergies: Does not bruise/bleed easily.       No temperature intolerance  Psychiatric/Behavioral: Negative for depression and suicidal ideas.    Blood pressure (!) 129/58, pulse 87, temperature 97.9 F (36.6 C), temperature source Oral, resp. rate 20, height '5\' 3"'  (1.6 m), weight 105.7 kg (233 lb), SpO2 98 %. Physical Exam  Vitals reviewed. Constitutional: She is oriented to person, place, and time. She appears well-developed and well-nourished. No distress.  HENT:  Head: Normocephalic and atraumatic.  Mouth/Throat: Oropharynx is clear and moist.  Eyes: Conjunctivae and EOM are normal. Pupils are equal, round, and reactive to light. No scleral icterus.  Neck: Normal range of motion. Neck supple. No JVD present. No tracheal deviation present. No thyromegaly present.  Cardiovascular: Normal rate, regular rhythm and normal heart sounds.  Exam reveals no gallop and no friction rub.   No murmur heard. Respiratory: Effort normal and breath sounds normal.  GI: Soft. Bowel sounds are normal. She exhibits no distension. There is no tenderness.  Colostomy in place  Genitourinary:  Genitourinary Comments: Deferred  Musculoskeletal: Normal range of motion. She exhibits edema.  Lymphadenopathy:    She has no cervical adenopathy.  Neurological: She is alert and oriented to person, place,  and time. No cranial nerve deficit. She exhibits normal muscle tone.  Skin: Skin is warm and dry. No rash noted. No erythema.  Psychiatric: She has a normal mood and affect. Her behavior is normal. Judgment and thought content normal.     Assessment/Plan This is a 66 year old female admitted for pneumonia. 1. Pneumonia: Healthcare associated; chest x-ray shows some consolidation in the left lower lobe. I have started the patient on cefepime and vancomycin. She does not meet criteria for sepsis. 2. UTI: Present on admission. Coverage for antibiotics as above 3. Diabetes mellitus type 2: Hold oral hypoglycemic agents. Sliding scale insulin while hospitalized. 4. Hypertension: Acceptable for age; continue losartan. 5. Diverticulitis: Status post colectomy. Continue Flagyl 6. DVT prophylaxis: Lovenox 7. GI prophylaxis: None The patient is a full code. Time spent on admission orders and patient care approximately 45 minutes  Harrie Foreman, MD 11/18/2016, 8:24 AM

## 2016-11-18 NOTE — ED Notes (Signed)
Pt sleeping, 500cc of urine in canister from external female catheter

## 2016-11-18 NOTE — Progress Notes (Signed)
*  PRELIMINARY RESULTS* Echocardiogram 2D Echocardiogram has been performed.  Kara [name] 11/18/2016, 4:16 PM

## 2016-11-18 NOTE — Care Management (Signed)
RNCM consult placed for patient from SN[facility].  Patient from [facility].  CSW notified.  Please re-consult RNCM if indicated.

## 2016-11-18 NOTE — Progress Notes (Signed)
[name]    MR#:  [id number]  DATE OF BIRTH:  [birth year]  SUBJECTIVE:  CHIEF COMPLAINT:   Chief Complaint  Patient presents with  . Shortness of Breath  The patient is a 66 year old Caucasian female with a history significant for history of essential hypertension, diabetes, recent diverticulitis with perforation, status post partial colectomy, who presented to the hospital with complaints of shortness of breath, worsened over the past 2 days, lower extremity swelling, which happened 3 days ago. Apparently patient was started on Lasix, which did not help. On arrival to emergency room, patient's O2 sats were 88% on room air, she required 4 L of oxygen to maintain O2 sats more than 94%. She had no pain. She was admitted to the hospital for further evaluation and treatment. Initial chest x-ray was suggestive of left lung opacity, questionable pneumonia. CT angiogram of the chest revealed pulmonary embolism bilaterally at lower lungs. No obvious pneumonia noted. Doppler ultrasound of lower extremities revealed DVT. Patient is initiated on Lovenox.  Review of Systems  Constitutional: Negative for chills, fever and weight loss.  HENT: Negative for congestion.   Eyes: Negative for blurred vision and double vision.  Respiratory: Positive for shortness of breath. Negative for cough, sputum production and wheezing.   Cardiovascular: Positive for leg swelling. Negative for chest pain, palpitations, orthopnea and PND.  Gastrointestinal: Negative for abdominal pain, blood in stool, constipation, diarrhea, nausea and vomiting.  Genitourinary: Negative for dysuria, frequency, hematuria and urgency.  Musculoskeletal: Negative for falls.  Neurological: Negative for dizziness, tremors, focal weakness and headaches.  Endo/Heme/Allergies: Does not bruise/bleed easily.  Psychiatric/Behavioral: Negative for depression.  The patient does not have insomnia.     VITAL SIGNS: Blood pressure 139/67, pulse 90, temperature 98.2 F (36.8 C), temperature source Oral, resp. rate 18, height 5\' 3"  (1.6 m), weight 95.9 kg (211 lb 6.4 oz), SpO2 97 %.  PHYSICAL EXAMINATION:   GENERAL:  66 y.o.-year-old patient lying in the bed with no acute distress.  EYES: Pupils equal, round, reactive to light and accommodation. No scleral icterus. Extraocular muscles intact.  HEENT: Head atraumatic, normocephalic. Oropharynx and nasopharynx clear.  NECK:  Supple, no jugular venous distention. No thyroid enlargement, no tenderness.  LUNGS: Normal breath sounds bilaterally, no wheezing, rales,rhonchi or crepitation. No use of accessory muscles of respiration.  CARDIOVASCULAR: S1, S2 normal. No murmurs, rubs, or gallops.  ABDOMEN: Soft, nontender, nondistended. Bowel sounds present. No organomegaly or mass.  EXTREMITIES: 2+ lower extremity and pedal edema, no cyanosis, or clubbing. Bilateral calf tenderness on palpation NEUROLOGIC: Cranial nerves II through XII are intact. Muscle strength 5/5 in all extremities. Sensation intact. Gait not checked.  PSYCHIATRIC: The patient is alert and oriented x 3.  SKIN: No obvious rash, lesion, or ulcer.   ORDERS/RESULTS REVIEWED:   CBC  Recent Labs Lab 11/13/16 0306 11/14/16 0346 11/17/16 2317  WBC 10.0 10.4 6.9  HGB 12.2 12.0 12.4  HCT 35.7 35.3 36.3  PLT 314 367 405  MCV 90.2 90.5 90.4  MCH 30.8 30.6 30.8  MCHC 34.1 33.8 34.1  RDW 13.9 14.0 14.1  LYMPHSABS 1.3 1.0  --   MONOABS 1.1* 1.1*  --   EOSABS 0.1 0.1  --   BASOSABS 0.0 0.0  --    ------------------------------------------------------------------------------------------------------------------  Chemistries   Recent Labs Lab 11/13/16 0306 11/14/16 0346 11/17/16 2317 11/18/16 0810 11/18/16 1343  NA 138 140 143  --   --  K 2.9* 3.1* 2.9*  --  3.8  CL 102 104 107  --   --   CO2 31 30 30   --   --   GLUCOSE 148*  187* 131*  --   --   BUN 5* <5* 5*  --   --   CREATININE 0.62 0.64 0.66  --   --   CALCIUM 7.6* 7.5* 8.2*  --   --   MG  --   --   --  2.0  --    ------------------------------------------------------------------------------------------------------------------ estimated creatinine clearance is 77.3 mL/min (by C-G formula based on SCr of 0.66 mg/dL). ------------------------------------------------------------------------------------------------------------------  Recent Labs  11/17/16 2317  TSH 1.212    Cardiac Enzymes  Recent Labs Lab 11/17/16 2317  TROPONINI <0.03   ------------------------------------------------------------------------------------------------------------------ Invalid input(s): POCBNP ---------------------------------------------------------------------------------------------------------------  RADIOLOGY: Dg Chest 2 View  Result Date: 11/17/2016 CLINICAL DATA:  Shortness of breath EXAM: CHEST  2 VIEW COMPARISON:  08/24/2016 FINDINGS: Low lung volumes with slightly elevated right diaphragm. Probable small pleural effusions on the lateral view. Atelectasis or infiltrate at the left lung base. Stable cardiomediastinal silhouette. No pneumothorax. IMPRESSION: Low lung volumes. Mild focal opacity at the left lung base may reflect atelectasis or a pneumonia. Probable small pleural effusions. Electronically Signed   By: Donavan Foil M.D.   On: 11/17/2016 23:59   Ct Angio Chest Pe W Or Wo Contrast  Result Date: 11/18/2016 CLINICAL DATA:  Shortness of breath and leg swelling EXAM: CT ANGIOGRAPHY CHEST WITH CONTRAST TECHNIQUE: Multidetector CT imaging of the chest was performed using the standard protocol during bolus administration of intravenous contrast. Multiplanar CT image reconstructions and MIPs were obtained to evaluate the vascular anatomy. CONTRAST:  75 mL Isovue 370. COMPARISON:  None. FINDINGS: Cardiovascular: Atherosclerotic calcifications of the thoracic  aorta are noted. No aneurysmal dilatation or dissection is seen. The pulmonary artery is well visualized and demonstrates evidence of a large pulmonary embolus involving the left lower lobe pulmonary arterial branches as well as similar findings in the right lower lobe. No upper lobe emboli are seen. No right heart strain is noted. Mediastinum/Nodes: No significant hilar or mediastinal adenopathy is seen. The thoracic inlet is within normal limits. The esophagus is within normal limits. Lungs/Pleura: Patchy infiltrates are noted within the lower lobes bilaterally slightly worse on the left than the right. Small left pleural effusion is seen. Upper Abdomen: The upper abdomen demonstrates no acute abnormality. Some mild changes of anasarca are noted in the lateral abdominal wall bilaterally. Musculoskeletal: Degenerative changes of the thoracic spine are noted. Review of the MIP images confirms the above findings. IMPRESSION: 1. Bilateral lower lobe pulmonary emboli slightly greater on the left than the right. Associated atelectatic changes and small left pleural effusion are noted. No right heart strain is noted Aortic Atherosclerosis (ICD10-I70.0). Critical Value/emergent results were called by telephone at the time of interpretation on 11/18/2016 at 3:31 pm to Dr. Theodoro Grist , who verbally acknowledged these results. Electronically Signed   By: Inez Catalina M.D.   On: 11/18/2016 15:31   US Venous Img Lower Bilateral  Result Date: 11/18/2016 CLINICAL DATA:  Bilateral lower extremity swelling. EXAM: BILATERAL LOWER EXTREMITY VENOUS DOPPLER ULTRASOUND TECHNIQUE: Gray-scale sonography with graded compression, as well as color Doppler and duplex ultrasound were performed to evaluate the lower extremity deep venous systems from the level of the common femoral vein and including the common femoral, femoral, profunda femoral, popliteal and calf veins including the posterior tibial, peroneal and gastrocnemius veins  when visible. The superficial great saphenous vein was also interrogated. Spectral Doppler was utilized to evaluate flow at rest and with distal augmentation maneuvers in the common femoral, femoral and popliteal veins. COMPARISON:  No recent prior. FINDINGS: RIGHT LOWER EXTREMITY Common Femoral Vein: No evidence of thrombus. Normal compressibility, respiratory phasicity and response to augmentation. Saphenofemoral Junction: No evidence of thrombus. Normal compressibility and flow on color Doppler imaging. Profunda Femoral Vein: No evidence of thrombus. Normal compressibility and flow on color Doppler imaging. Femoral Vein: No evidence of thrombus. Normal compressibility, respiratory phasicity and response to augmentation. Popliteal Vein: No evidence of thrombus. Normal compressibility, respiratory phasicity and response to augmentation. Calf Veins: Thrombus noted within the right posterior tibial peroneal veins. Superficial Great Saphenous Vein: No evidence of thrombus. Normal compressibility and flow on color Doppler imaging. Other Findings:  None. LEFT LOWER EXTREMITY Common Femoral Vein: No evidence of thrombus. Normal compressibility, respiratory phasicity and response to augmentation. Saphenofemoral Junction: No evidence of thrombus. Normal compressibility and flow on color Doppler imaging. Profunda Femoral Vein: No evidence of thrombus. Normal compressibility and flow on color Doppler imaging. Femoral Vein: No evidence of thrombus. Normal compressibility, respiratory phasicity and response to augmentation. Popliteal Vein: Thrombus noted in the popliteal vein . Calf Veins: Thrombus noted in the left posterior tibial and peroneal veins. Superficial Great Saphenous Vein: No evidence of thrombus. Normal compressibility and flow on color Doppler imaging. Other Findings:  None. IMPRESSION: Positive study for bilateral lower extremity DVT. Thrombus in the right posterior tibial and peroneal veins. Thrombus noted in  the left popliteal and left posterior tibial and peroneal veins. Electronically Signed   By: Marcello Moores  Register   On: 11/18/2016 13:32    EKG:  Orders placed or performed during the hospital encounter of 11/17/16  . ED EKG  . ED EKG    ASSESSMENT AND PLAN:  Active Problems:   Acute on chronic systolic (congestive) heart failure (HCC)   HCAP (healthcare-associated pneumonia)   Encounter for removal of staples  #1. Acute respiratory failure with hypoxia due to acute pulmonary embolism, initiate patient on Lovenox subcutaneously, continue oxygen therapy, if vascular surgery consultation #2. Acute pulmonary embolism, initiate patient on Lovenox, change to  Eliquis upon discharge #3 Bilateral lower extremity DVT, acute, continue Lovenox, if vascular in both recommendations, questionable IVC filter placement #4. Hypokalemia, resolved with supplementation  Management plans discussed with the patient, family and they are in agreement.   DRUG ALLERGIES:  Allergies  Allergen Reactions  . Latex     CODE STATUS:     Code Status Orders        Start     Ordered   11/18/16 0456  Full code  Continuous     11/18/16 0456    Code Status History    Date Active Date Inactive Code Status Order ID Comments User Context   11/01/2016  3:22 AM 11/14/2016  8:06 PM Full Code [id number]  Florene Glen, MD ED   10/03/2016  2:52 PM 10/06/2016  6:45 PM Full Code [id number]  [name], MD Inpatient   09/29/2016  7:47 PM 10/01/2016  9:11 PM Full Code [id number]  Jules Husbands, MD ED    Advance Directive Documentation     Most Recent Value  Type of Advance Directive  Healthcare Power of Attorney, Living will  Pre-existing out of facility DNR order (yellow form or pink MOST form)  -  "MOST" Form in Place?  -      TOTAL TIME  TAKING CARE OF THIS PATIENT: 40 minutes.    Theodoro Grist M.D on 11/18/2016 at 4:16 PM  Between 7am to 6pm - Pager - [phone]  After 6pm go to [email or web address] -  password [email or web address] [place] [facility]  Office  [phone]  CC: Primary care physician; Tracie Harrier, MD

## 2016-11-18 NOTE — Consult Note (Addendum)
Pharmacy Antibiotic Note  Kara [name] is a 66 y.o. female admitted on 11/17/2016 with pneumonia.  Pharmacy has been consulted for vancomycin dosing.  Plan: Pt received 1g of vancomycin in the ED. Will give next dose in 6 hours for stacked dosing Vancomycin 1000mg   IV every 12 hours.  Goal trough 15-20 mcg/mL. Trough prior to the 5th total dose  Height: 5\' 3"  (160 cm) Weight: 233 lb (105.7 kg) IBW/kg (Calculated) : 52.4  Temp (24hrs), Avg:98.1 F (36.7 C), Min:97.9 F (36.6 C), Max:98.2 F (36.8 C)   Recent Labs Lab 11/13/16 0306 11/14/16 0346 11/17/16 2317  WBC 10.0 10.4 6.9  CREATININE 0.62 0.64 0.66    Estimated Creatinine Clearance: 81.6 mL/min (by C-G formula based on SCr of 0.66 mg/dL).    Allergies  Allergen Reactions  . Latex     Antimicrobials this admission: cefepime 6/28 >>  vancomycin 6/28 >>  Zosyn one dose Metronidazole OP med  Dose adjustments this admission:   Microbiology results: 6/27 BCx:   6/12 MRSA PCR: positive  Thank you for allowing pharmacy to be a part of this patient's care.  Ramond Dial, Pharm.D, BCPS Clinical Pharmacist  11/18/2016 9:20 AM

## 2016-11-18 NOTE — Progress Notes (Signed)
11/18/2016  Subjective: Patient admitted early this morning with worsening shortness of breath.  She underwent a Hartmann's procedure for recurrent diverticulitis on 6/12 with Dr. Adonis Huguenin.  Was discharged on 6/24.  Now has pleural effusion with left lower lobe pneumonia.    The patient had an appointment with Dr. Burt Knack today in office for staple removal from her midline incision.  She asked her RN to contact surgical team for staple removal while she was in hospital.  She denies any issues with the wound.  She has not had any drainage or worsening pain.  Denies any erythema or induration.  Her ostomy is working well.  Vital signs: Temp:  [97.9 F (36.6 C)-98.2 F (36.8 C)] 97.9 F (36.6 C) (06/28 0451) Pulse Rate:  [87-91] 87 (06/28 0451) Resp:  [20] 20 (06/28 0451) BP: (129-164)/(58-88) 129/58 (06/28 0451) SpO2:  [95 %-98 %] 98 % (06/28 0451) Weight:  [105.7 kg (233 lb)] 105.7 kg (233 lb) (06/27 2317)   Intake/Output: No intake/output data recorded.    Physical Exam: Constitutional: No acute distress Abdomen:  Soft, nondistended, non-tender to palpation.  Incision is clean, dry, intact, with staples in place.  No evidence of infection.  Her ostomy has stool in bag, with still residual superficial necrosis as it had been prior to discharge.  Labs:   Recent Labs  11/17/16 2317  WBC 6.9  HGB 12.4  HCT 36.3  PLT 405    Recent Labs  11/17/16 2317  NA 143  K 2.9*  CL 107  CO2 30  GLUCOSE 131*  BUN 5*  CREATININE 0.66  CALCIUM 8.2*   No results for input(s): LABPROT, INR in the last 72 hours.  Imaging: Dg Chest 2 View  Result Date: 11/17/2016 CLINICAL DATA:  Shortness of breath EXAM: CHEST  2 VIEW COMPARISON:  08/24/2016 FINDINGS: Low lung volumes with slightly elevated right diaphragm. Probable small pleural effusions on the lateral view. Atelectasis or infiltrate at the left lung base. Stable cardiomediastinal silhouette. No pneumothorax. IMPRESSION: Low lung  volumes. Mild focal opacity at the left lung base may reflect atelectasis or a pneumonia. Probable small pleural effusions. Electronically Signed   By: Donavan Foil M.D.   On: 11/17/2016 23:59    Assessment/Plan: 66 yo female s/p Hartmann's for diverticulitis, now with pleural effusion and pneumonia.  --Staples removed at bedside and changed to Steri Strips without complications. --May continue Cardiac/Diabetic diet as currently doing and treat her effusion and pneumonia.   --Patient may follow up with Dr. Adonis Huguenin after discharge.  While in hospital, surgical team will be available if any issues or concerns.  Please feel free to call.   Melvyn Neth, [place] 7a-7p: [place] 7p-7a: [phone]

## 2016-11-18 NOTE — Progress Notes (Signed)
Lovenox changed to 40 mg BID for CrCl >30 and BMI >40. 

## 2016-11-19 ENCOUNTER — Encounter
Admission: EM | Disposition: A | Discharge status: Home / Self Care | Payer: Self-pay | Source: Home / Self Care | Attending: Specialist

## 2016-11-19 DIAGNOSIS — R0602 Shortness of breath: Secondary | ICD-10-CM

## 2016-11-19 DIAGNOSIS — E119 Type 2 diabetes mellitus without complications: Secondary | ICD-10-CM

## 2016-11-19 DIAGNOSIS — I82409 Acute embolism and thrombosis of unspecified deep veins of unspecified lower extremity: Secondary | ICD-10-CM

## 2016-11-19 DIAGNOSIS — I10 Essential (primary) hypertension: Secondary | ICD-10-CM

## 2016-11-19 DIAGNOSIS — I2699 Other pulmonary embolism without acute cor pulmonale: Secondary | ICD-10-CM

## 2016-11-19 HISTORY — PX: PULMONARY VENOGRAPHY: CATH118294

## 2016-11-19 LAB — CBC
HCT: 34.4 % — ABNORMAL LOW (ref 35.0–47.0)
HEMOGLOBIN: 11.8 g/dL — AB (ref 12.0–16.0)
MCH: 31.5 pg (ref 26.0–34.0)
MCHC: 34.2 g/dL (ref 32.0–36.0)
MCV: 92 fL (ref 80.0–100.0)
Platelets: 330 10*3/uL (ref 150–440)
RBC: 3.74 MIL/uL — AB (ref 3.80–5.20)
RDW: 14.4 % (ref 11.5–14.5)
WBC: 6.9 10*3/uL (ref 3.6–11.0)

## 2016-11-19 LAB — ECHOCARDIOGRAM COMPLETE
Height: 63 in
WEIGHTICAEL: 3382.4 [oz_av]

## 2016-11-19 LAB — HIV ANTIBODY (ROUTINE TESTING W REFLEX): HIV Screen 4th Generation wRfx: NONREACTIVE

## 2016-11-19 LAB — GLUCOSE, CAPILLARY
Glucose-Capillary: 125 mg/dL — ABNORMAL HIGH (ref 65–99)
Glucose-Capillary: 108 mg/dL — ABNORMAL HIGH (ref 65–99)

## 2016-11-19 SURGERY — PULMONARY VENOGRAPHY
Anesthesia: Moderate Sedation

## 2016-11-19 MED ORDER — ACETAMINOPHEN 500 MG PO TABS
ORAL_TABLET | ORAL | Status: AC
  Administered 2016-11-19: 18:00:00
  Filled 2016-11-19: qty 2

## 2016-11-19 MED ORDER — IOPAMIDOL (ISOVUE-300) INJECTION 61%
INTRAVENOUS | Status: DC | PRN
  Administered 2016-11-19: 35 mL via INTRAVENOUS

## 2016-11-19 MED ORDER — HEPARIN SODIUM (PORCINE) 1000 UNIT/ML IJ SOLN
INTRAMUSCULAR | Status: DC | PRN
  Administered 2016-11-19: 4000 [IU] via INTRAVENOUS

## 2016-11-19 MED ORDER — LIDOCAINE HCL (PF) 1 % IJ SOLN
INTRAMUSCULAR | Status: AC
  Filled 2016-11-19: qty 30

## 2016-11-19 MED ORDER — HEPARIN (PORCINE) IN NACL 2-0.9 UNIT/ML-% IJ SOLN
INTRAMUSCULAR | Status: AC
  Filled 2016-11-19: qty 1000

## 2016-11-19 MED ORDER — FENTANYL CITRATE (PF) 100 MCG/2ML IJ SOLN
INTRAMUSCULAR | Status: DC | PRN
  Administered 2016-11-19: 50 ug via INTRAVENOUS
  Administered 2016-11-19: 25 ug via INTRAVENOUS
  Administered 2016-11-19: 50 ug via INTRAVENOUS

## 2016-11-19 MED ORDER — MIDAZOLAM HCL 5 MG/5ML IJ SOLN
INTRAMUSCULAR | Status: AC
  Filled 2016-11-19: qty 5

## 2016-11-19 MED ORDER — ACETAMINOPHEN 500 MG PO TABS
1000.0000 mg | ORAL_TABLET | Freq: Once | ORAL | Status: AC
  Administered 2016-11-19: 1000 mg via ORAL

## 2016-11-19 MED ORDER — MIDAZOLAM HCL 2 MG/2ML IJ SOLN
INTRAMUSCULAR | Status: DC | PRN
  Administered 2016-11-19: 0.5 mg via INTRAVENOUS
  Administered 2016-11-19 (×2): 1 mg via INTRAVENOUS

## 2016-11-19 MED ORDER — FENTANYL CITRATE (PF) 100 MCG/2ML IJ SOLN
INTRAMUSCULAR | Status: AC
  Filled 2016-11-19: qty 2

## 2016-11-19 MED ORDER — HEPARIN SODIUM (PORCINE) 1000 UNIT/ML IJ SOLN
INTRAMUSCULAR | Status: AC
  Filled 2016-11-19: qty 1

## 2016-11-19 SURGICAL SUPPLY — 13 items
CANISTER PENUMBRA MAX (MISCELLANEOUS) ×3 IMPLANT
CATH INDIGO 8 TORQ TIP 85CM (CATHETERS) ×3 IMPLANT
CATH PIG 70CM (CATHETERS) ×3 IMPLANT
GLIDEWIRE ANGLED SS 035X260CM (WIRE) ×3 IMPLANT
KIT FEMORAL DEL DENALI (Miscellaneous) ×3 IMPLANT
NEEDLE ENTRY 21GA 7CM ECHOTIP (NEEDLE) ×3 IMPLANT
PACK ANGIOGRAPHY (CUSTOM PROCEDURE TRAY) ×3 IMPLANT
SET INTRO CAPELLA COAXIAL (SET/KITS/TRAYS/PACK) ×3 IMPLANT
SHEATH BRITE TIP 8FRX11 (SHEATH) ×6 IMPLANT
SYR MEDRAD MARK V 150ML (SYRINGE) ×3 IMPLANT
TUBING ASPIRATION INDIGO (MISCELLANEOUS) ×3 IMPLANT
TUBING CONTRAST HIGH PRESS 72 (TUBING) ×3 IMPLANT
WIRE J 3MM .035X145CM (WIRE) ×3 IMPLANT

## 2016-11-19 NOTE — Consult Note (Signed)
[facility] Vascular Consult Note  MRN : [id number]  Kara [name] is a 66 y.o. (Mar 01, 1951) female who presents with chief complaint of  Chief Complaint  Patient presents with  . Shortness of Breath  .  History of Present Illness: I am asked to see the patient by Dr. Ether Griffins for evaluation of her DVT and bilateral pulmonary embolism.  The patient is a 66 year old woman who underwent a sigmoid colon resection approximately 2 weeks ago secondary to perforation and acute diverticulitis. She notes she was in the hospital for approximate 15 days and quite ill. Yesterday she presented to [facility] with increasing shortness of breath. In the emergency room her O2 saturations were 88% on room air. After administration of 4 L of oxygen she was still only maintaining saturations of 94%. She also relates that for several days her lower extremities have been swelling significantly but she denies calf or leg pain.  Evaluation has included a CT angiogram which demonstrates large pulmonary emboli obstructing blood flow to the right middle and lower lobe as well as the left lower lobe and lingula. Duplex ultrasound lower extremity demonstrates bilateral DVT.  On meeting the patient today she remains in bed on 4 L of O2 with saturations in the mid 90s. On room air her saturations dropped to 91%.  She denies cough but she denies shortness of breath with exertion but she has not been up ambulating at this point. She denies pleuritic chest pain.  Current Facility-Administered Medications  Medication Dose Route Frequency Provider Last Rate Last Dose  . [MAR Hold] acetaminophen (TYLENOL) tablet 650 mg  650 mg Oral Q6H PRN Harrie Foreman, MD       Or  . Doug Sou Hold] acetaminophen (TYLENOL) suppository 650 mg  650 mg Rectal Q6H PRN Harrie Foreman, MD      . Doug Sou Hold] aspirin EC tablet 81 mg  81 mg Oral Daily Harrie Foreman, MD   81 mg at 11/19/16  1002  . [MAR Hold] cyclobenzaprine (FLEXERIL) tablet 10 mg  10 mg Oral Daily PRN Harrie Foreman, MD      . Doug Sou Hold] docusate sodium (COLACE) capsule 100 mg  100 mg Oral BID Harrie Foreman, MD   100 mg at 11/18/16 2101  . [MAR Hold] enoxaparin (LOVENOX) injection 105 mg  1 mg/kg Subcutaneous Q12H Theodoro Grist, MD   105 mg at 11/19/16 1004  . [MAR Hold] feeding supplement (ENSURE ENLIVE) (ENSURE ENLIVE) liquid 237 mL  237 mL Oral TID BM Harrie Foreman, MD   237 mL at 11/18/16 1509  . fentaNYL (SUBLIMAZE) injection    PRN Delana Meyer Dolores Lory, MD   50 mcg at 11/19/16 1522  . [MAR Hold] HYDROcodone-acetaminophen (NORCO/VICODIN) 5-325 MG per tablet 1 tablet  1 tablet Oral Q4H PRN Harrie Foreman, MD      . Doug Sou Hold] losartan (COZAAR) tablet 50 mg  50 mg Oral Daily Harrie Foreman, MD   50 mg at 11/19/16 1003  . midazolam (VERSED) injection    PRN Schnier, Dolores Lory, MD   1 mg at 11/19/16 1523  . [MAR Hold] multivitamin with minerals tablet 1 tablet  1 tablet Oral Daily Harrie Foreman, MD   1 tablet at 11/19/16 1002  . [MAR Hold] ondansetron (ZOFRAN) tablet 4 mg  4 mg Oral Q6H PRN Harrie Foreman, MD       Or  . Doug Sou Hold] ondansetron [facility])  injection 4 mg  4 mg Intravenous Q6H PRN Harrie Foreman, MD        Past Medical History:  Diagnosis Date  . Diabetes mellitus without complication ([place])   . Hypertension     Past Surgical History:  Procedure Laterality Date  . CARPAL TUNNEL RELEASE Bilateral   . COLON RESECTION SIGMOID N/A 11/02/2016   Procedure: COLON RESECTION SIGMOID;  Surgeon: [name], MD;  Location: [name] ORS;  Service: General;  Laterality: N/A;  . COLOSTOMY N/A 11/02/2016   Procedure: COLOSTOMY;  Surgeon: [name], MD;  Location: [name] ORS;  Service: General;  Laterality: N/A;  . KNEE SURGERY     torn menicus  . SIGMOIDOSCOPY N/A 11/13/2016   Procedure: endoscopic  flexible SIGMOIDOSCOPY;  Surgeon: Florene Glen, MD;  Location:  [name] ORS;  Service: General;  Laterality: N/A;    Social History Social History  Substance Use Topics  . Smoking status: Never Smoker  . Smokeless tobacco: Never Used  . Alcohol use No    Family History Family History  Problem Relation Age of Onset  . Diabetes Mother   . Heart disease Mother   . Cancer Mother   . Heart disease Father   . Diabetes Sister   . Heart disease Sister   No family history of bleeding/clotting disorders, porphyria or autoimmune disease   Allergies  Allergen Reactions  . Latex      REVIEW OF SYSTEMS (Negative unless checked)  Constitutional: [] Weight loss  [] Fever  [] Chills Cardiac: [] Chest pain   [] Chest pressure   [] Palpitations   [] Shortness of breath when laying flat   [x] Shortness of breath at rest   [] Shortness of breath with exertion. Vascular:  [] Pain in legs with walking   [] Pain in legs at rest   [] Pain in legs when laying flat   [] Claudication   [] Pain in feet when walking  [] Pain in feet at rest  [] Pain in feet when laying flat   [x] History of DVT   [] Phlebitis   [x] Swelling in legs   [] Varicose veins   [] Non-healing ulcers Pulmonary:   [] Uses home oxygen   [] Productive cough   [] Hemoptysis   [] Wheeze  [] COPD   [] Asthma Neurologic:  [] Dizziness  [] Blackouts   [] Seizures   [] History of stroke   [] History of TIA  [] Aphasia   [] Temporary blindness   [] Dysphagia   [] Weakness or numbness in arms   [] Weakness or numbness in legs Musculoskeletal:  [] Arthritis   [] Joint swelling   [] Joint pain   [] Low back pain Hematologic:  [] Easy bruising  [] Easy bleeding   [] Hypercoagulable state   [] Anemic  [] Hepatitis Gastrointestinal:  [] Blood in stool   [] Vomiting blood  [] Gastroesophageal reflux/heartburn   [] Difficulty swallowing. Genitourinary:  [] Chronic kidney disease   [] Difficult urination  [] Frequent urination  [] Burning with urination   [] Blood in urine Skin:  [] Rashes   [] Ulcers   [] Wounds Psychological:  [] History of anxiety   []  History of major  depression.  Physical Examination  Vitals:   11/19/16 0938 11/19/16 1001 11/19/16 1224 11/19/16 1404  BP:   134/61 (!) 152/65  Pulse: 91  94 92  Resp:    20  Temp:   98.1 F (36.7 C) 98.3 F (36.8 C)  TempSrc:   Oral   SpO2: 91% 92% 91% 91%  Weight:      Height:       Body mass index is 38.53 kg/m. Gen:  WD/WN, NAD Head: Felt/AT, No temporalis wasting. Prominent temp pulse  not noted. Ear/Nose/Throat: Hearing grossly intact, nares w/o erythema or drainage, oropharynx w/o Erythema/Exudate Eyes: Sclera non-icteric, conjunctiva clear Neck: Trachea midline.  No JVD.  Pulmonary:  Good air movement, respirations not labored, equal bilaterally.  Cardiac: RRR, normal S1, S2. Vascular: 2-3+ edema bilateral lower extremities soft and pitting no venous changes noted Vessel Right Left  Radial Palpable Palpable  Brachial Palpable Palpable  Gastrointestinal: soft, non-tender/non-distended. No guarding/reflex. Colostomy left lower quadrant appears viable Musculoskeletal: M/S 5/5 throughout.  Extremities without ischemic changes.  No deformity or atrophy. No edema. Neurologic: Sensation grossly intact in extremities.  Symmetrical.  Speech is fluent. Motor exam as listed above. Psychiatric: Judgment intact, Mood & affect appropriate for pt's clinical situation. Dermatologic: No rashes or ulcers noted.  No cellulitis or open wounds. Lymph : No Cervical, Axillary, or Inguinal lymphadenopathy.      CBC Lab Results  Component Value Date   WBC 6.9 11/19/2016   HGB 11.8 (L) 11/19/2016   HCT 34.4 (L) 11/19/2016   MCV 92.0 11/19/2016   PLT 330 11/19/2016    BMET    Component Value Date/Time   NA 143 11/17/2016 2317   NA 139 05/30/2013 0520   K 3.8 11/18/2016 1343   K 3.6 05/30/2013 0520   CL 107 11/17/2016 2317   CL 108 (H) 05/30/2013 0520   CO2 30 11/17/2016 2317   CO2 27 05/30/2013 0520   GLUCOSE 131 (H) 11/17/2016 2317   GLUCOSE 98 05/30/2013 0520   BUN 5 (L) 11/17/2016 2317    BUN 11 05/30/2013 0520   CREATININE 0.66 11/17/2016 2317   CREATININE 0.90 05/30/2013 0520   CALCIUM 8.2 (L) 11/17/2016 2317   CALCIUM 7.9 (L) 05/30/2013 0520   GFRNONAA >60 11/17/2016 2317   GFRNONAA >60 05/30/2013 0520   GFRAA >60 11/17/2016 2317   GFRAA >60 05/30/2013 0520   Estimated Creatinine Clearance: 78.5 mL/min (by C-G formula based on SCr of 0.66 mg/dL).  COAG No results found for: INR, PROTIME  Radiology Dg Chest 2 View  Result Date: 11/17/2016 CLINICAL DATA:  Shortness of breath EXAM: CHEST  2 VIEW COMPARISON:  08/24/2016 FINDINGS: Low lung volumes with slightly elevated right diaphragm. Probable small pleural effusions on the lateral view. Atelectasis or infiltrate at the left lung base. Stable cardiomediastinal silhouette. No pneumothorax. IMPRESSION: Low lung volumes. Mild focal opacity at the left lung base may reflect atelectasis or a pneumonia. Probable small pleural effusions. Electronically Signed   By: Donavan Foil M.D.   On: 11/17/2016 23:59   Dg Abd 1 View  Result Date: 11/05/2016 CLINICAL DATA:  Nasogastric tube placement. EXAM: ABDOMEN - 1 VIEW COMPARISON:  11/01/2016 CT FINDINGS: Nasogastric tube is in place, tip overlying the level of the distal stomach. Surgical clips overlie the lower central abdomen. A tube is coiled over the lower pelvis, incompletely evaluated. Visualized bowel gas pattern is nonobstructive. No evidence for free intraperitoneal air. IMPRESSION: Nasogastric tube tip overlying the level the distal stomach. Electronically Signed   By: Nolon Nations M.D.   On: 11/05/2016 10:35   Ct Angio Chest Pe W Or Wo Contrast  Result Date: 11/18/2016 CLINICAL DATA:  Shortness of breath and leg swelling EXAM: CT ANGIOGRAPHY CHEST WITH CONTRAST TECHNIQUE: Multidetector CT imaging of the chest was performed using the standard protocol during bolus administration of intravenous contrast. Multiplanar CT image reconstructions and MIPs were obtained to evaluate  the vascular anatomy. CONTRAST:  75 mL Isovue 370. COMPARISON:  None. FINDINGS: Cardiovascular: Atherosclerotic calcifications of the  thoracic aorta are noted. No aneurysmal dilatation or dissection is seen. The pulmonary artery is well visualized and demonstrates evidence of a large pulmonary embolus involving the left lower lobe pulmonary arterial branches as well as similar findings in the right lower lobe. No upper lobe emboli are seen. No right heart strain is noted. Mediastinum/Nodes: No significant hilar or mediastinal adenopathy is seen. The thoracic inlet is within normal limits. The esophagus is within normal limits. Lungs/Pleura: Patchy infiltrates are noted within the lower lobes bilaterally slightly worse on the left than the right. Small left pleural effusion is seen. Upper Abdomen: The upper abdomen demonstrates no acute abnormality. Some mild changes of anasarca are noted in the lateral abdominal wall bilaterally. Musculoskeletal: Degenerative changes of the thoracic spine are noted. Review of the MIP images confirms the above findings. IMPRESSION: 1. Bilateral lower lobe pulmonary emboli slightly greater on the left than the right. Associated atelectatic changes and small left pleural effusion are noted. No right heart strain is noted Aortic Atherosclerosis (ICD10-I70.0). Critical Value/emergent results were called by telephone at the time of interpretation on 11/18/2016 at 3:31 pm to Dr. Theodoro Grist , who verbally acknowledged these results. Electronically Signed   By: Inez Catalina M.D.   On: 11/18/2016 15:31   Ct Abdomen Pelvis W Contrast  Result Date: 11/01/2016 CLINICAL DATA:  Mid abdominal pain across the navel EXAM: CT ABDOMEN AND PELVIS WITH CONTRAST TECHNIQUE: Multidetector CT imaging of the abdomen and pelvis was performed using the standard protocol following bolus administration of intravenous contrast. CONTRAST:  158mL ISOVUE-300 IOPAMIDOL (ISOVUE-300) INJECTION 61% COMPARISON:   10/03/2016 FINDINGS: Lower chest: Lung bases demonstrate no acute consolidation or pleural effusion. The heart is nonenlarged. Hepatobiliary: Hepatic steatosis. Small stones in the gallbladder. No biliary dilatation Pancreas: Unremarkable. No pancreatic ductal dilatation or surrounding inflammatory changes. Spleen: Normal in size without focal abnormality. Adrenals/Urinary Tract: Adrenal glands are unremarkable. Kidneys are normal, without renal calculi, focal lesion, or hydronephrosis. Bladder is unremarkable. Stomach/Bowel: The stomach is nonenlarged. There is no evidence for bowel obstruction. Extensive sigmoid colon diverticular disease. Hazy fat stranding around the sigmoid colon with fluid and gas collections adjacent to the sigmoid colon, measuring 4.1 x 2.4 cm. Vascular/Lymphatic: Aortic atherosclerosis. Small lymph node anterior to the IVC measuring 6 mm. No significantly enlarged pelvic lymph nodes Reproductive: Uterus unremarkable. Left adnexal thickening adjacent to the sigmoid colon inflammatory changes. No adnexal mass Other: No free air. Musculoskeletal: Grade 1 anterolisthesis of L4 on L5. Degenerative changes at multiple levels. IMPRESSION: 1. Sigmoid colon diverticular disease with moderate inflammation around the sigmoid colon consistent with diverticulitis. Fluid and gas collection adjacent to the sigmoid colon consistent with localized perforation, uncertain if this represents residual of the previously noted perforation on the CT 1 month ago or if this represents repeat contained perforation. No drainable abscess visualized at this time. 2. Gallstones 3. Hepatic steatosis Electronically Signed   By: Donavan Foil M.D.   On: 11/01/2016 02:40   US Venous Img Lower Bilateral  Result Date: 11/18/2016 CLINICAL DATA:  Bilateral lower extremity swelling. EXAM: BILATERAL LOWER EXTREMITY VENOUS DOPPLER ULTRASOUND TECHNIQUE: Gray-scale sonography with graded compression, as well as color Doppler and  duplex ultrasound were performed to evaluate the lower extremity deep venous systems from the level of the common femoral vein and including the common femoral, femoral, profunda femoral, popliteal and calf veins including the posterior tibial, peroneal and gastrocnemius veins when visible. The superficial great saphenous vein was also interrogated. Spectral Doppler was utilized  to evaluate flow at rest and with distal augmentation maneuvers in the common femoral, femoral and popliteal veins. COMPARISON:  No recent prior. FINDINGS: RIGHT LOWER EXTREMITY Common Femoral Vein: No evidence of thrombus. Normal compressibility, respiratory phasicity and response to augmentation. Saphenofemoral Junction: No evidence of thrombus. Normal compressibility and flow on color Doppler imaging. Profunda Femoral Vein: No evidence of thrombus. Normal compressibility and flow on color Doppler imaging. Femoral Vein: No evidence of thrombus. Normal compressibility, respiratory phasicity and response to augmentation. Popliteal Vein: No evidence of thrombus. Normal compressibility, respiratory phasicity and response to augmentation. Calf Veins: Thrombus noted within the right posterior tibial peroneal veins. Superficial Great Saphenous Vein: No evidence of thrombus. Normal compressibility and flow on color Doppler imaging. Other Findings:  None. LEFT LOWER EXTREMITY Common Femoral Vein: No evidence of thrombus. Normal compressibility, respiratory phasicity and response to augmentation. Saphenofemoral Junction: No evidence of thrombus. Normal compressibility and flow on color Doppler imaging. Profunda Femoral Vein: No evidence of thrombus. Normal compressibility and flow on color Doppler imaging. Femoral Vein: No evidence of thrombus. Normal compressibility, respiratory phasicity and response to augmentation. Popliteal Vein: Thrombus noted in the popliteal vein . Calf Veins: Thrombus noted in the left posterior tibial and peroneal veins.  Superficial Great Saphenous Vein: No evidence of thrombus. Normal compressibility and flow on color Doppler imaging. Other Findings:  None. IMPRESSION: Positive study for bilateral lower extremity DVT. Thrombus in the right posterior tibial and peroneal veins. Thrombus noted in the left popliteal and left posterior tibial and peroneal veins. Electronically Signed   By: Marcello Moores  Register   On: 11/18/2016 13:32      Assessment/Plan 1. DVT associated with bilateral PEs and hypoxia:  I would agree given the patient's profound pulmonary compromise that IVC filter insertion is appropriate under these circumstances. Further insult to the pulmonary system would be catastrophic. Given the magnitude of the thrombus identified on the CT angiogram I would also encourage the patient to undergo pulmonary thrombectomy using the number device. Given her recent surgery TPA will not be utilized.  The patient has expressed a desire to do anything possible within reason to ensure that she does not require home oxygen therapy.  The risks and benefits for filter placement as well as pulmonary thrombectomy were reviewed all questions were answered patient agrees to proceed.  2. Type 2 diabetes:  Continue hypoglycemic medications as already ordered, these medications have been reviewed and there are no changes at this time.  Hgb A1C to be monitored as already arranged by primary service  3. Hypertension:  Continue antihypertensive medications as already ordered, these medications have been reviewed and there are no changes at this time.    Hortencia Pilar, MD  11/19/2016 3:24 PM    This note was created with Dragon medical transcription system.  Any error is purely unintentional

## 2016-11-19 NOTE — Progress Notes (Signed)
[facility] NAME: Alfred Eckley    MR#:  [id number]  DATE OF BIRTH:  December 31, 1950  SUBJECTIVE:  CHIEF COMPLAINT:   Chief Complaint  Patient presents with  . Shortness of Breath  The patient is a 66 year old Caucasian female with a history significant for history of essential hypertension, diabetes, recent diverticulitis with perforation, status post partial colectomy, who presented to the hospital with complaints of shortness of breath, worsened over the past 2 days, lower extremity swelling, which happened 3 days ago. Apparently patient was started on Lasix, which did not help. On arrival to emergency room, patient's O2 sats were 88% on room air, she required 4 L of oxygen to maintain O2 sats more than 94%. She had no pain. She was admitted to the hospital for further evaluation and treatment. Initial chest x-ray was suggestive of left lung opacity, questionable pneumonia. CT angiogram of the chest revealed pulmonary embolism bilaterally at lower lungs. No obvious pneumonia noted. Doppler ultrasound of lower extremities revealed DVT. Patient is initiated on Lovenox. The patient feels good today, she is weaned off oxygen completely. She was seen by vascular surgeon as recommended. IVC filter placement and thrombectomy. Review of Systems  Constitutional: Negative for chills, fever and weight loss.  HENT: Negative for congestion.   Eyes: Negative for blurred vision and double vision.  Respiratory: Positive for shortness of breath. Negative for cough, sputum production and wheezing.   Cardiovascular: Positive for leg swelling. Negative for chest pain, palpitations, orthopnea and PND.  Gastrointestinal: Negative for abdominal pain, blood in stool, constipation, diarrhea, nausea and vomiting.  Genitourinary: Negative for dysuria, frequency, hematuria and urgency.  Musculoskeletal: Negative for falls.  Neurological: Negative for  dizziness, tremors, focal weakness and headaches.  Endo/Heme/Allergies: Does not bruise/bleed easily.  Psychiatric/Behavioral: Negative for depression. The patient does not have insomnia.     VITAL SIGNS: Blood pressure (!) 152/65, pulse 92, temperature 98.3 F (36.8 C), resp. rate 20, height 5\' 3"  (1.6 m), weight 98.7 kg (217 lb 8 oz), SpO2 91 %.  PHYSICAL EXAMINATION:   GENERAL:  66 y.o.-year-old patient lying in the bed with no acute distress.  EYES: Pupils equal, round, reactive to light and accommodation. No scleral icterus. Extraocular muscles intact.  HEENT: Head atraumatic, normocephalic. Oropharynx and nasopharynx clear.  NECK:  Supple, no jugular venous distention. No thyroid enlargement, no tenderness.  LUNGS: Normal breath sounds bilaterally, no wheezing, rales,rhonchi or crepitation. No use of accessory muscles of respiration.  CARDIOVASCULAR: S1, S2 normal. No murmurs, rubs, or gallops.  ABDOMEN: Soft, nontender, nondistended. Bowel sounds present. No organomegaly or mass.  EXTREMITIES: 2+ lower extremity and pedal edema, no cyanosis, or clubbing. Bilateral calf tenderness on palpation NEUROLOGIC: Cranial nerves II through XII are intact. Muscle strength 5/5 in all extremities. Sensation intact. Gait not checked.  PSYCHIATRIC: The patient is alert and oriented x 3.  SKIN: No obvious rash, lesion, or ulcer.   ORDERS/RESULTS REVIEWED:   CBC  Recent Labs Lab 11/13/16 0306 11/14/16 0346 11/17/16 2317 11/19/16 0422  WBC 10.0 10.4 6.9 6.9  HGB 12.2 12.0 12.4 11.8*  HCT 35.7 35.3 36.3 34.4*  PLT 314 367 405 330  MCV 90.2 90.5 90.4 92.0  MCH 30.8 30.6 30.8 31.5  MCHC 34.1 33.8 34.1 34.2  RDW 13.9 14.0 14.1 14.4  LYMPHSABS 1.3 1.0  --   --   MONOABS 1.1* 1.1*  --   --   EOSABS 0.1 0.1  --   --  BASOSABS 0.0 0.0  --   --    ------------------------------------------------------------------------------------------------------------------  Chemistries   Recent  Labs Lab 11/13/16 0306 11/14/16 0346 11/17/16 2317 11/18/16 0810 11/18/16 1343  NA 138 140 143  --   --   K 2.9* 3.1* 2.9*  --  3.8  CL 102 104 107  --   --   CO2 31 30 30   --   --   GLUCOSE 148* 187* 131*  --   --   BUN 5* <5* 5*  --   --   CREATININE 0.62 0.64 0.66  --   --   CALCIUM 7.6* 7.5* 8.2*  --   --   MG  --   --   --  2.0  --    ------------------------------------------------------------------------------------------------------------------ estimated creatinine clearance is 78.5 mL/min (by C-G formula based on SCr of 0.66 mg/dL). ------------------------------------------------------------------------------------------------------------------  Recent Labs  11/17/16 2317  TSH 1.212    Cardiac Enzymes  Recent Labs Lab 11/17/16 2317  TROPONINI <0.03   ------------------------------------------------------------------------------------------------------------------ Invalid input(s): POCBNP ---------------------------------------------------------------------------------------------------------------  RADIOLOGY: Dg Chest 2 View  Result Date: 11/17/2016 CLINICAL DATA:  Shortness of breath EXAM: CHEST  2 VIEW COMPARISON:  08/24/2016 FINDINGS: Low lung volumes with slightly elevated right diaphragm. Probable small pleural effusions on the lateral view. Atelectasis or infiltrate at the left lung base. Stable cardiomediastinal silhouette. No pneumothorax. IMPRESSION: Low lung volumes. Mild focal opacity at the left lung base may reflect atelectasis or a pneumonia. Probable small pleural effusions. Electronically Signed   By: Donavan Foil M.D.   On: 11/17/2016 23:59   Ct Angio Chest Pe W Or Wo Contrast  Result Date: 11/18/2016 CLINICAL DATA:  Shortness of breath and leg swelling EXAM: CT ANGIOGRAPHY CHEST WITH CONTRAST TECHNIQUE: Multidetector CT imaging of the chest was performed using the standard protocol during bolus administration of intravenous contrast.  Multiplanar CT image reconstructions and MIPs were obtained to evaluate the vascular anatomy. CONTRAST:  75 mL Isovue 370. COMPARISON:  None. FINDINGS: Cardiovascular: Atherosclerotic calcifications of the thoracic aorta are noted. No aneurysmal dilatation or dissection is seen. The pulmonary artery is well visualized and demonstrates evidence of a large pulmonary embolus involving the left lower lobe pulmonary arterial branches as well as similar findings in the right lower lobe. No upper lobe emboli are seen. No right heart strain is noted. Mediastinum/Nodes: No significant hilar or mediastinal adenopathy is seen. The thoracic inlet is within normal limits. The esophagus is within normal limits. Lungs/Pleura: Patchy infiltrates are noted within the lower lobes bilaterally slightly worse on the left than the right. Small left pleural effusion is seen. Upper Abdomen: The upper abdomen demonstrates no acute abnormality. Some mild changes of anasarca are noted in the lateral abdominal wall bilaterally. Musculoskeletal: Degenerative changes of the thoracic spine are noted. Review of the MIP images confirms the above findings. IMPRESSION: 1. Bilateral lower lobe pulmonary emboli slightly greater on the left than the right. Associated atelectatic changes and small left pleural effusion are noted. No right heart strain is noted Aortic Atherosclerosis (ICD10-I70.0). Critical Value/emergent results were called by telephone at the time of interpretation on 11/18/2016 at 3:31 pm to Dr. Theodoro Grist , who verbally acknowledged these results. Electronically Signed   By: Inez Catalina M.D.   On: 11/18/2016 15:31   US Venous Img Lower Bilateral  Result Date: 11/18/2016 CLINICAL DATA:  Bilateral lower extremity swelling. EXAM: BILATERAL LOWER EXTREMITY VENOUS DOPPLER ULTRASOUND TECHNIQUE: Gray-scale sonography with graded compression, as well as color Doppler and  duplex ultrasound were performed to evaluate the lower extremity  deep venous systems from the level of the common femoral vein and including the common femoral, femoral, profunda femoral, popliteal and calf veins including the posterior tibial, peroneal and gastrocnemius veins when visible. The superficial great saphenous vein was also interrogated. Spectral Doppler was utilized to evaluate flow at rest and with distal augmentation maneuvers in the common femoral, femoral and popliteal veins. COMPARISON:  No recent prior. FINDINGS: RIGHT LOWER EXTREMITY Common Femoral Vein: No evidence of thrombus. Normal compressibility, respiratory phasicity and response to augmentation. Saphenofemoral Junction: No evidence of thrombus. Normal compressibility and flow on color Doppler imaging. Profunda Femoral Vein: No evidence of thrombus. Normal compressibility and flow on color Doppler imaging. Femoral Vein: No evidence of thrombus. Normal compressibility, respiratory phasicity and response to augmentation. Popliteal Vein: No evidence of thrombus. Normal compressibility, respiratory phasicity and response to augmentation. Calf Veins: Thrombus noted within the right posterior tibial peroneal veins. Superficial Great Saphenous Vein: No evidence of thrombus. Normal compressibility and flow on color Doppler imaging. Other Findings:  None. LEFT LOWER EXTREMITY Common Femoral Vein: No evidence of thrombus. Normal compressibility, respiratory phasicity and response to augmentation. Saphenofemoral Junction: No evidence of thrombus. Normal compressibility and flow on color Doppler imaging. Profunda Femoral Vein: No evidence of thrombus. Normal compressibility and flow on color Doppler imaging. Femoral Vein: No evidence of thrombus. Normal compressibility, respiratory phasicity and response to augmentation. Popliteal Vein: Thrombus noted in the popliteal vein . Calf Veins: Thrombus noted in the left posterior tibial and peroneal veins. Superficial Great Saphenous Vein: No evidence of thrombus. Normal  compressibility and flow on color Doppler imaging. Other Findings:  None. IMPRESSION: Positive study for bilateral lower extremity DVT. Thrombus in the right posterior tibial and peroneal veins. Thrombus noted in the left popliteal and left posterior tibial and peroneal veins. Electronically Signed   By: Marcello Moores  Register   On: 11/18/2016 13:32    EKG:  Orders placed or performed during the hospital encounter of 11/17/16  . ED EKG  . ED EKG    ASSESSMENT AND PLAN:  Active Problems:   Acute on chronic systolic (congestive) heart failure (HCC)   HCAP (healthcare-associated pneumonia)   Encounter for removal of staples  #1. Acute respiratory failure with hypoxia due to acute pulmonary embolism, continue Lovenox subcutaneously, now off oxygen therapy, vascular surgery is planning thrombectomy and IVC filter placement today, changed to Eliquis upon discharge to skilled nursing facility tomorrow.  #2. Acute pulmonary embolism, continue Lovenox, change to  Eliquis upon discharge #3 Bilateral lower extremity DVT, acute, continue Lovenox, patient was seen by vascular surgeon as recommended thrombectomy and IVC filter placement #4. Hypokalemia, resolved with supplementation #5. Anemia, follow with therapy, get Hemoccult  Management plans discussed with the patient, family and they are in agreement.   DRUG ALLERGIES:  Allergies  Allergen Reactions  . Latex     CODE STATUS:     Code Status Orders        Start     Ordered   11/18/16 0456  Full code  Continuous     11/18/16 0456    Code Status History    Date Active Date Inactive Code Status Order ID Comments User Context   11/01/2016  3:22 AM 11/14/2016  8:06 PM Full Code [id number]  Florene Glen, MD ED   10/03/2016  2:52 PM 10/06/2016  6:45 PM Full Code [id number]  [name], MD Inpatient   09/29/2016  7:47 PM 10/01/2016  9:11 PM Full Code [id number]  Jules Husbands, MD ED    Advance Directive Documentation     Most Recent Value   Type of Advance Directive  Healthcare Power of Attorney, Living will  Pre-existing out of facility DNR order (yellow form or pink MOST form)  -  "MOST" Form in Place?  -      TOTAL TIME TAKING CARE OF THIS PATIENT: 35 minutes.   Discussed this patient's husband, all questions were answered Susi Goslin M.D on 11/19/2016 at 4:38 PM  Between 7am to 6pm - Pager - [phone]  After 6pm go to [email or web address] - password [email or web address] [facility]  Office  [phone]  CC: Primary care physician; Tracie Harrier, MD

## 2016-11-19 NOTE — Progress Notes (Addendum)
PT Hold Note  Patient Details Name: Kara [name] MRN: [id number] DOB: 1951-03-26   Hold Treatment:    Reason Eval/Treat Not Completed: Patient not medically ready. Chart reviewed. Pt with DVT and bilateral PE. On therapeutic dose of Lovenox since 11/18/16 at 2101. Normal policy for PT evaluation is 48 hours after therapeutic anticoagulation initiated however pt is now out of room for IVC filter placement. Per protocol pt is appropriate for PT evaluation after physician ordered bed rest time following IVC filter placement. PT evaluation will be performed on 11/20/16 assuming IVC filter placement is successful.   Lyndel Safe Huprich PT, DPT   Huprich,Jason 11/19/2016, 3:25 PM

## 2016-11-19 NOTE — Op Note (Signed)
[facility]  Percutaneous Study/Intervention Procedural Note   Date of Surgery: 11/19/2016,4:38 PM  Surgeon:Tashari Schoenfelder, Dolores Lory   Pre-operative Diagnosis: Symptomatic bilateral pulmonary emboli associated with bilateral lower extremity DVT  Post-operative diagnosis:  Same  Procedure(s) Performed:  1.  Contrast injection right heart and pulmonary arteries  2.  Selective catheter placement right pulmonary artery third order catheter placement  3.  Selective catheter placement left pulmonary artery second order catheter placement  4.  Mechanical thrombectomy right middle and lower lobe pulmonary arteries  5.  Placement of an inferior vena caval filter, [place] type      Anesthesia: Conscious sedation was administered under my direct supervision by the interventional radiology RN. IV Versed plus fentanyl were utilized. Continuous ECG, pulse oximetry and blood pressure was monitored throughout the entire procedure.  Versed and fentanyl were administered intravenously.  Conscious sedation was administered for a total of 43 minutes.  Sheath: 8 French sheath right common femoral vein  Contrast: 35 cc   Fluoroscopy Time: 5.7 minutes  Indications:  Patient presents with pulmonary emboli. The patient is symptomatic with hypoxemia and dyspnea on exertion.  There is evidence of right heart strain on the CT angiogram and the patient is hypoxic on room air. The patient is otherwise a good candidate for intervention and even the long-term benefits pulmonary angiography with thrombolysis is offered. The risks and benefits are reviewed long-term benefits are discussed. All questions are answered patient agrees to proceed.  Procedure:  Kara [name] a 66 y.o. female who was identified and appropriate procedural time out was performed.  The patient was then placed supine on the table and prepped and draped in the usual sterile fashion.  Ultrasound was used to evaluate the  right common femoral vein.  It was patent, as it was echolucent and compressible.  A digital ultrasound image was acquired for the permanent record.  A micropuncture needle was used to access the right common femoral vein under direct ultrasound guidance.  A microwire was then advanced under fluoroscopic guidance followed by micro-sheath.  A 0.035 J wire was advanced without resistance and a 5Fr sheath was placed and then upsized to an 8 [place] sheath.    The wire and pigtail catheter were then negotiated into the right atrium and bolus injection of contrast was utilized to demonstrate the right ventricle and the pulmonary artery outflow. The wire and catheter were then negotiated into the main pulmonary artery where hand injection of contrast was utilized to demonstrate the pulmonary arteries and confirm the locations of the pulmonary emboli.  4000 Units of heparin was then given and allowed to circulate.  The Penumbra Cat 8 catheter was then advanced up into the pulmonary vasculature. The right lung was addressed first. Catheter was negotiated into the right middle lobe pulmonary artery and right lower lobe pulmonary artery and mechanical thrombectomy was performed. Follow-up imaging demonstrated a good result and therefore the catheter was renegotiated into the left pulmonary artery and images were obtained with the catheter in the left lower lobe pulmonary artery. No significant thrombus burden was seen on this side.  Follow-up imaging was then obtained demonstrating marked improvement with essentially no visible thrombus remaining in the right lung.  The wire sheath were then pulled back into the inferior vena cava. The 8 French sheath was exchanged for the delivery sheath for the [facility] filter. With the sheath and the markers just at the iliac confluence Allis injection contrast was used to demonstrate  the inferior vena cava. The renal blushes were noted at the L1 level. The vena cava was measured  and noted to be 19 mm in diameter. The wire was reintroduced the sheath repositioned to the L2 level and the [facility] filter deployed in excellent orientation without difficulty.  Sheath were then pulled and pressure was held for hemostasis. There were no immediate complications.   Findings:   Right heart imaging:  Right atrium and right ventricle and the pulmonary outflow tract appears normal  Right lung:  There is thrombus present within the ulnar artery to the middle lobe as well as the pulmonary artery to the lower lobe. Following multiple passes with the penumbra catheter there is now essentially total resolution of this thrombus with filling of the entire lung.  Left lung:  Imaging with the catheter tip positioned in the mid left lower lobe pulmonary artery demonstrated patency of the pulmonary vasculature without evidence of large pulmonary emboli.             Inferior vena cava: Inferior vena cava is opacified with a bolus of contrast. There are no filling defects noted there is no evidence of thrombus formation. The cava was measured and noted to be 19 mm in diameter. The [place] filter is deployed at the L2 level in excellent orientation.   Disosition: Patient was taken to the recovery room in stable condition having tolerated the procedure well.  [name] 11/19/2016,4:38 PM

## 2016-11-20 LAB — CBC
HCT: 34.7 % — ABNORMAL LOW (ref 35.0–47.0)
Hemoglobin: 11.8 g/dL — ABNORMAL LOW (ref 12.0–16.0)
MCH: 30.8 pg (ref 26.0–34.0)
MCHC: 34 g/dL (ref 32.0–36.0)
MCV: 90.6 fL (ref 80.0–100.0)
PLATELETS: 323 10*3/uL (ref 150–440)
RBC: 3.83 MIL/uL (ref 3.80–5.20)
RDW: 14.6 % — AB (ref 11.5–14.5)
WBC: 6.5 10*3/uL (ref 3.6–11.0)

## 2016-11-20 LAB — CREATININE, SERUM: CREATININE: 0.66 mg/dL (ref 0.44–1.00)

## 2016-11-20 NOTE — Clinical Social Work Note (Signed)
Clinical Social Work Assessment  Patient Details  Name: Kara [name] MRN: [id number] Date of Birth: 1950-08-04  Date of referral:  11/20/16               Reason for consult:  Facility Placement                Permission sought to share information with:  Facility Art therapist granted to share information::     Name::        Agency::     Relationship::     Contact Information:     Housing/Transportation Living arrangements for the past 2 months:  [place], [place] of Information:    Patient Interpreter Needed:    Criminal Activity/Legal Involvement Pertinent to Current Situation/Hospitalization:    Significant Relationships:  Spouse Lives with:  Spouse, Facility Resident Do you feel safe going back to the place where you live?  Yes Need for family participation in patient care:  No (Coment)  Care giving concerns:  Patient recently placed at [facility] last weekend.   Social Worker assessment / plan:  Patient was readmitted with an unrelated diagnosis than last time. Patient is known to this CSW from placing her last admission to [facility]. Patient to return to [facility] at discharge once [place] is obtained.   Employment status:    Insurance information:    PT Recommendations:    Information / Referral to community resources:     Patient/Family's Response to care:  Patient is in agreement to return to [facility].  Patient/Family's Understanding of and Emotional Response to Diagnosis, Current Treatment, and Prognosis:  Patient is coping well with her readmission and is improving medically.  Emotional Assessment Appearance:  Appears stated age Attitude/Demeanor/Rapport:    Affect (typically observed):    Orientation:  Oriented to Self, Oriented to Place, Oriented to  Time, Oriented to Situation Alcohol / Substance use:  Not Applicable Psych involvement (Current and /or in the  community):  No (Comment)  Discharge Needs  Concerns to be addressed:  Care Coordination Readmission within the last 30 days:  No Current discharge risk:  None Barriers to Discharge:  No Barriers Identified   Shela Leff, LCSW 11/20/2016, 5:45 AM

## 2016-11-20 NOTE — Evaluation (Signed)
Physical Therapy Evaluation Patient Details Name: Kara [name] MRN: [id number] DOB: Oct 28, 1950 Today's Date: 11/20/2016   History of Present Illness  Patient is a 66 y/o female that presents with LE swelling and shortness of breath, found to have bilateral PEs and DVTs. She had filters placed in bilateral pulmonary arteries and inferior vena cava on 6/29 was on 4 hours of bedrest and permitted to mobilize. She was recently hospitalized for infection in her colon and has a colostomy bag, was ambulating short distances in a SNF prior to this admission.    Clinical Impression  Patient has had a rash of hospitalizations in the past month, up to 1 month ago she was independently ambulating in the community and singing karaoke on [address] in [place]. Over that time she has had a colostomy bag after infected colon and now bilateral PEs and DVTs. She is able to stand with very minimal assistance and RW, however she fatigues quickly with ambulation, though O2 sats remained >95% on 2L of O2 with no buckling of her LEs. She was previously just beginning to negotiate stairs at [facility] prior to this admission. She would benefit from additional time spent at short term rehabilitation to improve her ability to ambulate household distances safely.     Follow Up Recommendations SNF    Equipment Recommendations       Recommendations for Other Services       Precautions / Restrictions Precautions Precautions: Fall Restrictions Weight Bearing Restrictions: No      Mobility  Bed Mobility               General bed mobility comments: Patient received in recliner.   Transfers Overall transfer level: Needs assistance Equipment used: Rolling walker (2 wheeled) Transfers: Sit to/from Stand Sit to Stand: Min assist;Min guard         General transfer comment: Patient requires prolonged time to complete, but is able to perform transfer with very minimal assistance.    Ambulation/Gait Ambulation/Gait assistance: Min guard Ambulation Distance (Feet): 25 Feet Assistive device: Rolling walker (2 wheeled) Gait Pattern/deviations: WFL(Within Functional Limits)   Gait velocity interpretation: <1.8 ft/sec, indicative of risk for recurrent falls General Gait Details: Patient is able to ambulate slowly with RW and no buckling, her O2 sats increased from 94 up to 99% on 2L of O2, HR was elevated between 112-125 bpm though no symptoms noted by patient.   Stairs            Wheelchair Mobility    Modified Rankin (Stroke Patients Only)       Balance Overall balance assessment: Needs assistance Sitting-balance support: No upper extremity supported Sitting balance-Leahy Scale: Good     Standing balance support: Bilateral upper extremity supported Standing balance-Leahy Scale: Fair                               Pertinent Vitals/Pain Pain Assessment: Faces Faces Pain Scale: Hurts little more Pain Location: abdomen Pain Descriptors / Indicators: Operative site guarding;Aching;Grimacing Pain Intervention(s): Limited activity within patient's tolerance;Monitored during session    Home Living Family/patient expects to be discharged to:: Skilled nursing facility                      Prior Function Level of Independence: Needs assistance         Comments: As of a few weeks ago patient was singing [employer] [place] in [place]. She has  since been hospitalized several times and was just able to walk short distances in rehab with a walker.      Hand Dominance        Extremity/Trunk Assessment   Upper Extremity Assessment Upper Extremity Assessment: Generalized weakness    Lower Extremity Assessment Lower Extremity Assessment: Generalized weakness       Communication   Communication: No difficulties  Cognition Arousal/Alertness: Awake/alert Behavior During Therapy: WFL for tasks assessed/performed Overall Cognitive  Status: Within Functional Limits for tasks assessed                                        General Comments      Exercises     Assessment/Plan    PT Assessment Patient needs continued PT services  PT Problem List Decreased strength;Decreased activity tolerance;Decreased balance;Decreased mobility;Cardiopulmonary status limiting activity       PT Treatment Interventions Gait training;Functional mobility training;Therapeutic exercise;Balance training;Therapeutic activities    PT Goals (Current goals can be found in the Care Plan section)  Acute Rehab PT Goals Patient Stated Goal: To get stronger so she can return to singing  PT Goal Formulation: With patient Time For Goal Achievement: 12/04/16 Potential to Achieve Goals: Good    Frequency Min 2X/week   Barriers to discharge        Co-evaluation               AM-PAC PT "6 Clicks" Daily Activity  Outcome Measure Difficulty turning over in bed (including adjusting bedclothes, sheets and blankets)?: A Little Difficulty moving from lying on back to sitting on the side of the bed? : A Lot Difficulty sitting down on and standing up from a chair with arms (e.g., wheelchair, bedside commode, etc,.)?: A Little Help needed moving to and from a bed to chair (including a wheelchair)?: A Little Help needed walking in hospital room?: A Little Help needed climbing 3-5 steps with a railing? : A Lot 6 Click Score: 16    End of Session Equipment Utilized During Treatment: Gait belt;Oxygen Activity Tolerance: Patient limited by fatigue Patient left: in chair;with call bell/phone within reach (No chair alarm on when PT entered the room) Nurse Communication: Mobility status PT Visit Diagnosis: Muscle weakness (generalized) (M62.81);Difficulty in walking, not elsewhere classified (R26.2)    Time: 1050-1105 PT Time Calculation (min) (ACUTE ONLY): 15 min   Charges:   PT Evaluation $PT Eval High Complexity: 1  Procedure     PT G Codes:   PT G-Codes **NOT FOR INPATIENT CLASS** Functional Assessment Tool Used: AM-PAC 6 Clicks Basic Mobility Functional Limitation: Mobility: Walking and moving around Mobility: Walking and Moving Around Current Status (U1103): At least 60 percent but less than 80 percent impaired, limited or restricted Mobility: Walking and Moving Around Goal Status [phone]): At least 40 percent but less than 60 percent impaired, limited or restricted    Royce Macadamia PT, DPT, CSCS    11/20/2016, 1:54 PM

## 2016-11-20 NOTE — Progress Notes (Signed)
External female catheter changed as per policy states , pt tol well.

## 2016-11-21 LAB — GLUCOSE, CAPILLARY
Glucose-Capillary: 156 mg/dL — ABNORMAL HIGH (ref 65–99)
Glucose-Capillary: 147 mg/dL — ABNORMAL HIGH (ref 65–99)
Glucose-Capillary: 156 mg/dL — ABNORMAL HIGH (ref 65–99)

## 2016-11-21 MED ORDER — INSULIN ASPART 100 UNIT/ML ~~LOC~~ SOLN: 0.0000 [IU] | Freq: Every day | SUBCUTANEOUS | Status: DC

## 2016-11-21 MED ORDER — INSULIN ASPART 100 UNIT/ML ~~LOC~~ SOLN
0.0000 [IU] | Freq: Three times a day (TID) | SUBCUTANEOUS | Status: DC
  Administered 2016-11-21: 2 [IU] via SUBCUTANEOUS
  Administered 2016-11-21 – 2016-11-22 (×2): 1 [IU] via SUBCUTANEOUS
  Administered 2016-11-22: 2 [IU] via SUBCUTANEOUS
  Filled 2016-11-21 (×4): qty 1

## 2016-11-21 MED ORDER — APIXABAN 5 MG PO TABS: 5.0000 mg | ORAL_TABLET | Freq: Two times a day (BID) | ORAL | Status: DC

## 2016-11-21 MED ORDER — APIXABAN 5 MG PO TABS
10.0000 mg | ORAL_TABLET | Freq: Two times a day (BID) | ORAL | Status: DC
  Administered 2016-11-21 – 2016-11-22 (×2): 10 mg via ORAL
  Filled 2016-11-21 (×2): qty 2

## 2016-11-21 NOTE — Progress Notes (Signed)
[facility] at [place] NAME: Kara [name]    MR#:  [id number]  DATE OF BIRTH:  06/14/1950  SUBJECTIVE:   Patient here due to shortness of breath and noted to have DVT and pulmonary embolism. Status post IVC filter placement and thrombectomy POD # 2.  Shortness of breath improved.  No acute events overnight.   REVIEW OF SYSTEMS:    Review of Systems  Constitutional: Negative for chills and fever.  HENT: Negative for congestion and tinnitus.   Eyes: Negative for blurred vision and double vision.  Respiratory: Negative for cough, shortness of breath and wheezing.   Cardiovascular: Negative for chest pain, orthopnea and PND.  Gastrointestinal: Negative for abdominal pain, diarrhea, nausea and vomiting.  Genitourinary: Negative for dysuria and hematuria.  Neurological: Negative for dizziness, sensory change and focal weakness.  All other systems reviewed and are negative.   Nutrition: Carb Control Tolerating Diet: yes Tolerating PT: Eval noted   DRUG ALLERGIES:   Allergies  Allergen Reactions  . Latex     VITALS:  Blood pressure 129/69, pulse 88, temperature 98 F (36.7 C), temperature source Oral, resp. rate 20, height 5\' 3"  (1.6 m), weight 100.5 kg (221 lb 8 oz), SpO2 94 %.  PHYSICAL EXAMINATION:   Physical Exam  GENERAL:  66 y.o.-year-old obese patient lying in bed in no acute distress.  EYES: Pupils equal, round, reactive to light and accommodation. No scleral icterus. Extraocular muscles intact.  HEENT: Head atraumatic, normocephalic. Oropharynx and nasopharynx clear.  NECK:  Supple, no jugular venous distention. No thyroid enlargement, no tenderness.  LUNGS: Normal breath sounds bilaterally, no wheezing, rales, rhonchi. No use of accessory muscles of respiration.  CARDIOVASCULAR: S1, S2 normal. No murmurs, rubs, or gallops.  ABDOMEN: Soft, nontender, nondistended. Bowel sounds present. No organomegaly or mass. +  mid-line dressing from recent surgery.  + colostomy with stool in it.  EXTREMITIES: No cyanosis, clubbing or edema b/l.    NEUROLOGIC: Cranial nerves II through XII are intact. No focal Motor or sensory deficits b/l.  Globally weak.  PSYCHIATRIC: The patient is alert and oriented x 3.  SKIN: No obvious rash, lesion, or ulcer.    LABORATORY PANEL:   CBC  Recent Labs Lab 11/20/16 0355  WBC 6.5  HGB 11.8*  HCT 34.7*  PLT 323   ------------------------------------------------------------------------------------------------------------------  Chemistries   Recent Labs Lab 11/17/16 2317 11/18/16 0810 11/18/16 1343 11/20/16 0355  NA 143  --   --   --   K 2.9*  --  3.8  --   CL 107  --   --   --   CO2 30  --   --   --   GLUCOSE 131*  --   --   --   BUN 5*  --   --   --   CREATININE 0.66  --   --  0.66  CALCIUM 8.2*  --   --   --   MG  --  2.0  --   --    ------------------------------------------------------------------------------------------------------------------  Cardiac Enzymes  Recent Labs Lab 11/17/16 2317  TROPONINI <0.03   ------------------------------------------------------------------------------------------------------------------  RADIOLOGY:  No results found.   ASSESSMENT AND PLAN:   66 year old female with past medical history of diabetes, hypertension, recent admission for diverticulitis status post exploratory laparotomy with colon resection and colostomy who presented to the hospital due to shortness of breath.  1. Acute respiratory failure with hypoxia-secondary to pulmonary embolism. -Continue O2  supplementation. - cont. Eliquis and wean O2 as tolerated.   2. Pulmonary Embolus/DVT - cause of pt's shortness of breath/hypoxia.  - Seen by vascular surgery and due to her clot burden patient is status post IVC filter placement, thrombectomy POD # 2. - cont. Eliquis  3.  Essential hypertension-continue losartan.  4. Recent history of  diverticulosis/diverticulitis with abscess status post Hartman's procedure and colostomy placement. -Tolerating by mouth well, no nausea vomiting abdominal pain. Continue follow-up with surgery as outpatient.  5. DM - hold Glipizide.  - cont. SSI.    Seen by PT and they recommend SNF and awaiting reauthorization from insurance.   All the records are reviewed and case discussed with Care Management/Social Worker. Management plans discussed with the patient, family and they are in agreement.  CODE STATUS: Full code  DVT Prophylaxis: Eliquis  TOTAL TIME TAKING CARE OF THIS PATIENT: 25 minutes.   POSSIBLE D/C IN 1-2 DAYS, DEPENDING ON CLINICAL CONDITION.   Henreitta Leber M.D on 11/21/2016 at 11:26 AM  Between 7am to 6pm - Pager - [phone]  After 6pm go to [email or web address] - Proofreader  [facility]  Office  [phone]  CC: Primary care physician; Tracie Harrier, MD

## 2016-11-21 NOTE — Progress Notes (Signed)
[facility] at [place] NAME: Kara [name]    MR#:  [id number]  DATE OF BIRTH:  04-Jul-1950  SUBJECTIVE:   Patient here due to shortness of breath and noted to have DVT and pulmonary embolism. Status post IVC filter placement and thrombectomy yesterday.  Shortness of breath improved.  No Hemoptysis or any other events overnight.   REVIEW OF SYSTEMS:    Review of Systems  Constitutional: Negative for chills and fever.  HENT: Negative for congestion and tinnitus.   Eyes: Negative for blurred vision and double vision.  Respiratory: Negative for cough, shortness of breath and wheezing.   Cardiovascular: Negative for chest pain, orthopnea and PND.  Gastrointestinal: Negative for abdominal pain, diarrhea, nausea and vomiting.  Genitourinary: Negative for dysuria and hematuria.  Neurological: Negative for dizziness, sensory change and focal weakness.  All other systems reviewed and are negative.   Nutrition: Carb Control Tolerating Diet: yes Tolerating PT: Eval noted   DRUG ALLERGIES:   Allergies  Allergen Reactions  . Latex     VITALS:  Blood pressure 129/69, pulse 88, temperature 98 F (36.7 C), temperature source Oral, resp. rate 20, height 5\' 3"  (1.6 m), weight 100.5 kg (221 lb 8 oz), SpO2 94 %.  PHYSICAL EXAMINATION:   Physical Exam  GENERAL:  66 y.o.-year-old obese patient lying in bed in no acute distress.  EYES: Pupils equal, round, reactive to light and accommodation. No scleral icterus. Extraocular muscles intact.  HEENT: Head atraumatic, normocephalic. Oropharynx and nasopharynx clear.  NECK:  Supple, no jugular venous distention. No thyroid enlargement, no tenderness.  LUNGS: Normal breath sounds bilaterally, no wheezing, rales, rhonchi. No use of accessory muscles of respiration.  CARDIOVASCULAR: S1, S2 normal. No murmurs, rubs, or gallops.  ABDOMEN: Soft, nontender, nondistended. Bowel sounds present. No  organomegaly or mass. + mid-line dressing from recent surgery.  + colostomy with stool in it.  EXTREMITIES: No cyanosis, clubbing or edema b/l.    NEUROLOGIC: Cranial nerves II through XII are intact. No focal Motor or sensory deficits b/l.  Globally weak.  PSYCHIATRIC: The patient is alert and oriented x 3.  SKIN: No obvious rash, lesion, or ulcer.    LABORATORY PANEL:   CBC  Recent Labs Lab 11/20/16 0355  WBC 6.5  HGB 11.8*  HCT 34.7*  PLT 323   ------------------------------------------------------------------------------------------------------------------  Chemistries   Recent Labs Lab 11/17/16 2317 11/18/16 0810 11/18/16 1343 11/20/16 0355  NA 143  --   --   --   K 2.9*  --  3.8  --   CL 107  --   --   --   CO2 30  --   --   --   GLUCOSE 131*  --   --   --   BUN 5*  --   --   --   CREATININE 0.66  --   --  0.66  CALCIUM 8.2*  --   --   --   MG  --  2.0  --   --    ------------------------------------------------------------------------------------------------------------------  Cardiac Enzymes  Recent Labs Lab 11/17/16 2317  TROPONINI <0.03   ------------------------------------------------------------------------------------------------------------------  RADIOLOGY:  No results found.   ASSESSMENT AND PLAN:   66 year old female with past medical history of diabetes, hypertension, recent admission for diverticulitis status post exploratory laparotomy with colon resection and colostomy who presented to the hospital due to shortness of breath.  1. Acute respiratory failure with hypoxia-secondary to pulmonary embolism. -Continue  O2 supplementation. - on Lovenox and will switch to Eliquis.   2. Pulmonary Embolus/DVT - cause of pt's shortness of breath/hypoxia.  - Seen by vascular surgery and due to her clot burden patient is status post IVC filter placement, thrombectomy. -Currently on Lovenox and will switch to Eliquis  3.  Essential  hypertension-continue losartan.  4. Recent history of diverticulosis/diverticulitis with abscess status post Hartman's procedure and colostomy placement. -Tolerating by mouth well, no nausea vomiting abdominal pain. Continue follow-up with surgery as outpatient.  5. DM - hold Glipizide.  - cont. SSI.    Seen by PT and they recommend SNF and waiting reauthorization from insurance.   All the records are reviewed and case discussed with Care Management/Social Worker. Management plans discussed with the patient, family and they are in agreement.  CODE STATUS: Full code  DVT Prophylaxis: Eliquis  TOTAL TIME TAKING CARE OF THIS PATIENT: 30 minutes.   POSSIBLE D/C IN 2-3 DAYS, DEPENDING ON CLINICAL CONDITION.   Henreitta Leber M.D on 11/21/2016 at 11:11 AM  Between 7am to 6pm - Pager - [phone]  After 6pm go to [email or web address] - Proofreader  [facility]  Office  [phone]  CC: Primary care physician; Tracie Harrier, MD

## 2016-11-21 NOTE — Progress Notes (Signed)
ANTICOAGULATION CONSULT NOTE - Initial Consult  Pharmacy Consult for apixaban Indication: DVT and bilateral PE  Allergies  Allergen Reactions  . Latex    Patient Measurements: Height: 5\' 3"  (160 cm) Weight: 221 lb 8 oz (100.5 kg) IBW/kg (Calculated) : 52.4  Vital Signs: Temp: 98 F (36.7 C) (07/01 0428) Temp Source: Oral (07/01 0428) BP: 129/69 (07/01 0428) Pulse Rate: 88 (07/01 0428)  Labs:  Recent Labs  11/19/16 0422 11/20/16 0355  HGB 11.8* 11.8*  HCT 34.4* 34.7*  PLT 330 323  CREATININE  --  0.66    Estimated Creatinine Clearance: 79.2 mL/min (by C-G formula based on SCr of 0.66 mg/dL).   Medical History: Past Medical History:  Diagnosis Date  . Diabetes mellitus without complication ([place])   . Hypertension     Assessment: Pharmacy consulted to dose and monitor apixaban for this 66 year old female diagnosed with a DVT and bilateral PE. Patient is being converted from therapeutic enoxaparin to apixaban.  Last dose of enoxaparin was ~0900 this morning.   Plan:  Apixaban 10 mg PO BID x 14 doses (7 days) followed by apixaban 5 mg PO BID. CBC and SCr should be checked at least every 72 hours per protocol.  Lenis Noon, PharmD Clinical Pharmacist 11/21/2016,12:02 PM

## 2016-11-22 ENCOUNTER — Encounter: Payer: Self-pay | Admitting: Vascular Surgery

## 2016-11-22 DIAGNOSIS — I5032 Chronic diastolic (congestive) heart failure: Secondary | ICD-10-CM | POA: Diagnosis not present

## 2016-11-22 DIAGNOSIS — Z48815 Encounter for surgical aftercare following surgery on the digestive system: Secondary | ICD-10-CM | POA: Diagnosis not present

## 2016-11-22 DIAGNOSIS — E119 Type 2 diabetes mellitus without complications: Secondary | ICD-10-CM | POA: Diagnosis not present

## 2016-11-22 DIAGNOSIS — Z9981 Dependence on supplemental oxygen: Secondary | ICD-10-CM | POA: Insufficient documentation

## 2016-11-22 DIAGNOSIS — I110 Hypertensive heart disease with heart failure: Secondary | ICD-10-CM | POA: Diagnosis not present

## 2016-11-22 DIAGNOSIS — Z7901 Long term (current) use of anticoagulants: Secondary | ICD-10-CM | POA: Diagnosis not present

## 2016-11-22 DIAGNOSIS — R6889 Other general symptoms and signs: Secondary | ICD-10-CM | POA: Diagnosis not present

## 2016-11-22 DIAGNOSIS — Z433 Encounter for attention to colostomy: Secondary | ICD-10-CM | POA: Diagnosis not present

## 2016-11-22 DIAGNOSIS — I2699 Other pulmonary embolism without acute cor pulmonale: Secondary | ICD-10-CM | POA: Diagnosis not present

## 2016-11-22 DIAGNOSIS — M62838 Other muscle spasm: Secondary | ICD-10-CM | POA: Diagnosis not present

## 2016-11-22 DIAGNOSIS — Z7984 Long term (current) use of oral hypoglycemic drugs: Secondary | ICD-10-CM | POA: Diagnosis not present

## 2016-11-22 DIAGNOSIS — K5732 Diverticulitis of large intestine without perforation or abscess without bleeding: Secondary | ICD-10-CM | POA: Diagnosis not present

## 2016-11-22 DIAGNOSIS — Z7982 Long term (current) use of aspirin: Secondary | ICD-10-CM | POA: Diagnosis not present

## 2016-11-22 DIAGNOSIS — K5792 Diverticulitis of intestine, part unspecified, without perforation or abscess without bleeding: Secondary | ICD-10-CM | POA: Diagnosis not present

## 2016-11-22 DIAGNOSIS — K5790 Diverticulosis of intestine, part unspecified, without perforation or abscess without bleeding: Secondary | ICD-10-CM | POA: Diagnosis not present

## 2016-11-22 DIAGNOSIS — Z7401 Bed confinement status: Secondary | ICD-10-CM | POA: Diagnosis not present

## 2016-11-22 DIAGNOSIS — R600 Localized edema: Secondary | ICD-10-CM | POA: Diagnosis not present

## 2016-11-22 LAB — GLUCOSE, CAPILLARY
Glucose-Capillary: 145 mg/dL — ABNORMAL HIGH (ref 65–99)
Glucose-Capillary: 171 mg/dL — ABNORMAL HIGH (ref 65–99)

## 2016-11-22 MED ORDER — APIXABAN 5 MG PO TABS: ORAL_TABLET | ORAL | 1 refills | Status: DC

## 2016-11-22 MED ORDER — HYDROCODONE-ACETAMINOPHEN 5-300 MG PO TABS: 1.0000 | ORAL_TABLET | ORAL | 0 refills | Status: DC | PRN

## 2016-11-22 NOTE — Progress Notes (Signed)
Physical Therapy Treatment Patient Details Name: Kara [name] MRN: [id number] DOB: 26-Nov-1950 Today's Date: 11/22/2016    History of Present Illness Patient is a 66 y/o female that presents with LE swelling and shortness of breath, found to have bilateral PEs and DVTs. She had filters placed in bilateral pulmonary arteries and inferior vena cava on 6/29 was on 4 hours of bedrest and permitted to mobilize. She was recently hospitalized for infection in her colon and has a colostomy bag, was ambulating short distances in a SNF prior to this admission.      PT Comments    Pt is a pleasant 66 y.o. F, admitted to acute care for with LE swelling and SOB; found to have B PE's and DVT's. Pt performs bed mobility with minA, due to impaired strength. Tranfers and ambulation performed with min guard, secondary to impaired strength and intermittent dizziness. Pt amb total of 5 ft. Pt on room air throughout duration of treatment; O2 dropped to 87% upon exertion, but pt recovered quickly to >90% with education on pursed lipped breathing. Pt continues to present with the following deficits: strength and endurance. Pt is primarily limited by fatigue and impaired CV endurance. Overall, pt responded well to today's treatment with no adverse affects; pt is progressing well towards functional goals. Pt would benefit from continued skilled PT to address the previously mentioned impairments and promote return to PLOF. Currently recommending SNF, pending d/c    Follow Up Recommendations  SNF     Equipment Recommendations  Rolling walker with 5" wheels    Recommendations for Other Services       Precautions / Restrictions Precautions Precautions: Fall Restrictions Weight Bearing Restrictions: No    Mobility  Bed Mobility Overal bed mobility: Needs Assistance Bed Mobility: Supine to Sit;Sit to Supine     Supine to sit: Min assist     General bed mobility comments: MinA with supine to sit,  requiring elevated HOB and bed rail; physical assist at shoulders to acheive an erect position in bed.   Transfers Overall transfer level: Needs assistance Equipment used: Rolling walker (2 wheeled) Transfers: Sit to/from Stand Sit to Stand: Min guard         General transfer comment: Min guard STS; required increased time to acheive task. Did well with recalling correct safety awaeness/body mechanics with STS transfers. Required min cues to reach for chair rails when descending from stand to sit.   Ambulation/Gait Ambulation/Gait assistance: Min guard Ambulation Distance (Feet): 5 Feet Assistive device: Rolling walker (2 wheeled) Gait Pattern/deviations: Step-through pattern     General Gait Details: Pt amb 5 ft to with CGA, due to intermittent reports of dizziness with erect posture; pt demo good reciprical gait pattern. Pt O2 saturation dropped to 87% on RA with amb, but pt recoverd quickly to >90% with sitting and education on pursed lipped breathing.   Stairs            Wheelchair Mobility    Modified Rankin (Stroke Patients Only)       Balance                                            Cognition Arousal/Alertness: Awake/alert Behavior During Therapy: WFL for tasks assessed/performed Overall Cognitive Status: Within Functional Limits for tasks assessed  Exercises Other Exercises Other Exercises: Seated therex to B LE's with supervision x 10 reps: ankle pumps, quad sets, glute sets, heel slides (unable to perform without slight hip ER on L LE, due to c/o pain at surgical site). Pt on RA throughout duration of therex, and required verbal cue to use pursed lipped breathing.  Other Exercises: Seated therex perofrmed with supervision to B LE's x10 reps: LAQ's and marches. On RA, and required min verbal cues to practice pursed lipped breathing for maintainng O2 saturation >90%.  Other Exercises:  Toileting: pt incontinent and required bed level changing of adult diaper. CNA present; pt able to perform bed rollign to R and L with ModI, using bed railing to assist. Required increased time to perform rolling, due to impaired generealized strength. Min cues provided on correct body mechanics.     General Comments        Pertinent Vitals/Pain Pain Assessment: No/denies pain    Home Living                      Prior Function            PT Goals (current goals can now be found in the care plan section) Acute Rehab PT Goals Patient Stated Goal: To get stronger so she can return to singing  PT Goal Formulation: With patient Time For Goal Achievement: 12/04/16 Potential to Achieve Goals: Good Progress towards PT goals: Progressing toward goals    Frequency    Min 2X/week      PT Plan Current plan remains appropriate    Co-evaluation              AM-PAC PT "6 Clicks" Daily Activity  Outcome Measure  Difficulty turning over in bed (including adjusting bedclothes, sheets and blankets)?: A Little Difficulty moving from lying on back to sitting on the side of the bed? : Total Difficulty sitting down on and standing up from a chair with arms (e.g., wheelchair, bedside commode, etc,.)?: Total Help needed moving to and from a bed to chair (including a wheelchair)?: A Little Help needed walking in hospital room?: A Little Help needed climbing 3-5 steps with a railing? : A Lot 6 Click Score: 13    End of Session Equipment Utilized During Treatment: Gait belt Activity Tolerance: Patient limited by fatigue Patient left: in chair;with call bell/phone within reach;with chair alarm set Nurse Communication: Mobility status PT Visit Diagnosis: Muscle weakness (generalized) (M62.81);Difficulty in walking, not elsewhere classified (R26.2)     Time: 2595-6387 PT Time Calculation (min) (ACUTE ONLY): 23 min  Charges:                       G Codes:       Oran Rein PT, SPT   Bevelyn Ngo 11/22/2016, 12:47 PM

## 2016-11-22 NOTE — Discharge Summary (Signed)
[place] [facility] [place] NAME: Kara [name]    MR#:  [id number]  DATE OF BIRTH:  [birth year]  DATE OF ADMISSION:  11/17/2016 ADMITTING PHYSICIAN: Harrie Foreman, MD  DATE OF DISCHARGE: 11/22/2016  PRIMARY CARE PHYSICIAN: Tracie Harrier, MD    ADMISSION DIAGNOSIS:  Shortness of breath [R06.02] Hypokalemia [E87.6] Acute pulmonary edema (HCC) [J81.0] SOB (shortness of breath) [R06.02] HCAP (healthcare-associated pneumonia) [J18.9]  DISCHARGE DIAGNOSIS:  Active Problems:   Acute on chronic systolic (congestive) heart failure (HCC)   HCAP (healthcare-associated pneumonia)   Encounter for removal of staples   SECONDARY DIAGNOSIS:   Past Medical History:  Diagnosis Date  . Diabetes ([place])   . Diverticulitis   . Hypertension   . 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[facility])     HOSPITAL COURSE:   66 year old female with past medical history of diabetes, hypertension, recent admission for diverticulitis status post exploratory laparotomy with colon resection and colostomy who presented to the hospital due to shortness of breath.  1. Acute respiratory failure with hypoxia- this was secondary to pulmonary embolism. - pt. Will cont. O2 supplementation.  Currently on Oral Eliquis for PE and will cont. That.   2. Pulmonary Embolus/DVT - this was the cause of pt's shortness of breath/hypoxia.  - Seen by vascular surgery and due to her clot burden patient is status post IVC filter placement, thrombectomy POD # 3. - initially was on Lovenox but now being discharged on Eliquis.   3.  Essential hypertension- she will continue losartan.  4. Recent history of diverticulosis/diverticulitis with abscess status post Hartman's procedure and colostomy placement. -Tolerating by mouth well, no nausea vomiting abdominal pain. Continue follow-up with surgery as outpatient.  5. DM - while in the hospital pt. Was on SSI but will resume her Glipizide  upon discharge.   6. Hypokalemia - resolved with supplementation.   DISCHARGE CONDITIONS:   Stable.   CONSULTS OBTAINED:  Treatment Team:  Yolonda Kida, MD Schnier, Dolores Lory, MD  DRUG ALLERGIES:   Allergies  Allergen Reactions  . Latex     DISCHARGE MEDICATIONS:   Allergies as of 11/22/2016      Reactions   Latex       Medication List    STOP taking these medications   ciprofloxacin 500 MG tablet Commonly known as:  CIPRO   metroNIDAZOLE 500 MG tablet Commonly known as:  FLAGYL     TAKE these medications   apixaban 5 MG Tabs tablet Commonly known as:  ELIQUIS 10 mg PO BID X 13 days then start 5 mg PO BID.   aspirin EC 81 MG tablet Take 81 mg by mouth daily.   cyclobenzaprine 10 MG tablet Commonly known as:  FLEXERIL Take 10 mg by mouth daily as needed.   diclofenac 75 MG EC tablet Commonly known as:  VOLTAREN Take 75 mg by mouth daily.   feeding supplement (ENSURE ENLIVE) Liqd Take 237 mLs by mouth 3 (three) times daily between meals.   glipiZIDE 5 MG tablet Commonly known as:  GLUCOTROL Take 0.5 tablets (2.5 mg total) by mouth daily before breakfast.   Hydrocodone-Acetaminophen 5-300 MG Tabs Commonly known as:  VICODIN Take 1 tablet by mouth every 4 (four) hours as needed.   losartan 50 MG tablet Commonly known as:  COZAAR Take 1 tablet by mouth daily.   MULTI-VITAMINS Tabs Take 1 tablet by mouth daily.         DISCHARGE INSTRUCTIONS:  DIET:  Cardiac diet and Diabetic diet  DISCHARGE CONDITION:  Stable  ACTIVITY:  Activity as tolerated  OXYGEN:  Home Oxygen: Yes.     Oxygen Delivery: 2 liters/min via Patient connected to nasal cannula oxygen  DISCHARGE LOCATION:  nursing home   If you experience worsening of your admission symptoms, develop shortness of breath, life threatening emergency, suicidal or homicidal thoughts you must seek medical attention immediately by calling 911 or calling your MD immediately  if  symptoms less severe.  You Must read complete instructions/literature along with all the possible adverse reactions/side effects for all the Medicines you take and that have been prescribed to you. Take any new Medicines after you have completely understood and accpet all the possible adverse reactions/side effects.   Please note  You were cared for by a hospitalist during your hospital stay. If you have any questions about your discharge medications or the care you received while you were in the hospital after you are discharged, you can call the unit and asked to speak with the hospitalist on call if the hospitalist that took care of you is not available. Once you are discharged, your primary care physician will handle any further medical issues. Please note that NO REFILLS for any discharge medications will be authorized once you are discharged, as it is imperative that you return to your primary care physician (or establish a relationship with a primary care physician if you do not have one) for your aftercare needs so that they can reassess your need for medications and monitor your lab values.     Today   Still having some shortness of breath but improved.  No Hemoptysis. No other acute events overnight.   VITAL SIGNS:  Blood pressure (!) 112/48, pulse 93, temperature 98.2 F (36.8 C), temperature source Oral, resp. rate 18, height 5\' 3"  (1.6 m), weight 100.5 kg (221 lb 8 oz), SpO2 93 %.  I/O:    Intake/Output Summary (Last 24 hours) at 11/22/16 1211 Last data filed at 11/22/16 0505  Gross per 24 hour  Intake                0 ml  Output             2300 ml  Net            -2300 ml    PHYSICAL EXAMINATION:   GENERAL:  66 y.o.-year-old obese patient lying in bed in no acute distress.  EYES: Pupils equal, round, reactive to light and accommodation. No scleral icterus. Extraocular muscles intact.  HEENT: Head atraumatic, normocephalic. Oropharynx and nasopharynx clear.  NECK:   Supple, no jugular venous distention. No thyroid enlargement, no tenderness.  LUNGS: Normal breath sounds bilaterally, no wheezing, rales, rhonchi. No use of accessory muscles of respiration.  CARDIOVASCULAR: S1, S2 normal. No murmurs, rubs, or gallops.  ABDOMEN: Soft, nontender, nondistended. Bowel sounds present. No organomegaly or mass. + mid-line dressing from recent surgery.  + colostomy with stool in it.  EXTREMITIES: No cyanosis, clubbing or edema b/l.    NEUROLOGIC: Cranial nerves II through XII are intact. No focal Motor or sensory deficits b/l.  Globally weak.  PSYCHIATRIC: The patient is alert and oriented x 3.  SKIN: No obvious rash, lesion, or ulcer.   DATA REVIEW:   CBC  Recent Labs Lab 11/20/16 0355  WBC 6.5  HGB 11.8*  HCT 34.7*  PLT 323    Chemistries   Recent Labs Lab 11/17/16 2317 11/18/16 0810  11/18/16 1343 11/20/16 0355  NA 143  --   --   --   K 2.9*  --  3.8  --   CL 107  --   --   --   CO2 30  --   --   --   GLUCOSE 131*  --   --   --   BUN 5*  --   --   --   CREATININE 0.66  --   --  0.66  CALCIUM 8.2*  --   --   --   MG  --  2.0  --   --     Cardiac Enzymes  Recent Labs Lab 11/17/16 2317  TROPONINI <0.03    Microbiology Results  Results for orders placed or performed during the hospital encounter of 11/17/16  Culture, blood (routine x 2)     Status: None (Preliminary result)   Collection Time: 11/18/16 12:18 AM  Result Value Ref Range Status   Specimen Description BLOOD R AC  Final   Special Requests   Final    BOTTLES DRAWN AEROBIC AND ANAEROBIC Blood Culture adequate volume   Culture NO GROWTH 4 DAYS  Final   Report Status PENDING  Incomplete  Culture, blood (routine x 2)     Status: None (Preliminary result)   Collection Time: 11/18/16 12:18 AM  Result Value Ref Range Status   Specimen Description BLOOD LT HAND  Final   Special Requests   Final    BOTTLES DRAWN AEROBIC AND ANAEROBIC Blood Culture adequate volume   Culture NO  GROWTH 4 DAYS  Final   Report Status PENDING  Incomplete    RADIOLOGY:  No results found.    Management plans discussed with the patient, family and they are in agreement.  CODE STATUS:     Code Status Orders        Start     Ordered   11/18/16 0456  Full code  Continuous     11/18/16 0456     TOTAL TIME TAKING CARE OF THIS PATIENT: 40 minutes.    Henreitta Leber M.D on 11/22/2016 at 12:11 PM  Between 7am to 6pm - Pager - [phone]  After 6pm go to [email or web address] - Proofreader  [facility]  Office  [phone]  CC: Primary care physician; Tracie Harrier, MD

## 2016-11-22 NOTE — Care Management Important Message (Signed)
Important Message  Patient Details  Name: Kara [name] MRN: [id number] Date of Birth: 05/03/1951   Medicare Important Message Given:  Yes    Beverly Sessions, RN 11/22/2016, 1:49 PM

## 2016-11-22 NOTE — Clinical Social Work Note (Signed)
Patient discharging to return to [facility]. Kara [name] at [facility] had information to begin reauth since early this morning. Christella Scheuermann has not yet provided them with re-auth. CSW gained permission from Surveyor, quantity to do a 5 day Letter of Guarantee while waiting on re-auth. Discharge information sent and Kara [name] at [facility] is aware. Shela Leff MSW,LCSW [phone]

## 2016-11-22 NOTE — Progress Notes (Signed)
Pt prepared for d/c to SNF. IV d/c'd. Skin intact except as charted in most recent assessments. Vitals are stable. Report called to receiving facility, IV removed, external catheter removed. Pt to be transported by ambulance service.  Katherine Syme [employer]

## 2016-11-22 NOTE — Care Management (Addendum)
Patient to discharge today back to [facility].  CSW facilitating.  30 day trail Eliquis coupon provided.

## 2016-11-23 ENCOUNTER — Encounter: Payer: Self-pay | Admitting: Vascular Surgery

## 2016-11-23 DIAGNOSIS — I2699 Other pulmonary embolism without acute cor pulmonale: Secondary | ICD-10-CM | POA: Diagnosis not present

## 2016-11-23 DIAGNOSIS — R600 Localized edema: Secondary | ICD-10-CM | POA: Diagnosis not present

## 2016-11-23 DIAGNOSIS — K5792 Diverticulitis of intestine, part unspecified, without perforation or abscess without bleeding: Secondary | ICD-10-CM | POA: Diagnosis not present

## 2016-11-23 LAB — CULTURE, BLOOD (ROUTINE X 2)
Culture: NO GROWTH
Culture: NO GROWTH
SPECIAL REQUESTS: ADEQUATE
SPECIAL REQUESTS: ADEQUATE

## 2016-12-02 ENCOUNTER — Encounter: Payer: Self-pay | Admitting: Gastroenterology

## 2016-12-02 ENCOUNTER — Telehealth: Payer: Self-pay | Admitting: General Practice

## 2016-12-02 ENCOUNTER — Ambulatory Visit (INDEPENDENT_AMBULATORY_CARE_PROVIDER_SITE_OTHER): Payer: PPO | Admitting: Gastroenterology

## 2016-12-02 VITALS — BP 112/65 | HR 95 | Temp 97.9°F | Ht 63.0 in | Wt 221.8 lb

## 2016-12-02 DIAGNOSIS — K5732 Diverticulitis of large intestine without perforation or abscess without bleeding: Secondary | ICD-10-CM

## 2016-12-02 NOTE — Telephone Encounter (Signed)
Patient is calling, she is asking where does she need to pick up bags for her stomach and she is being released to come home tomorrow with home health coming to her home. Please call patient and advice.

## 2016-12-02 NOTE — Progress Notes (Signed)
Jonathon Bellows MD, MRCP(U.K) [address]  [place]  [place], [place] [address]  Main: [phone]  Fax: [phone]   Gastroenterology Consultation  Referring Provider:     Florene Glen, MD Primary Care Physician:  Tracie Harrier, MD Primary Gastroenterologist:  Dr. Jonathon Bellows  Reason for Consultation:     Diverticulitis        HPI:   Kara [name] is a 66 y.o. y/o female referred for consultation & management  by Dr. Tracie Harrier, MD.    She has been referred for diverticulitis.   Summary of history :  She presented to the ER on 11/18/16 with shortness of breath , she has had multiple episodes of diverticulitis in the past . She presented to the hospital on 11/12/16 with abdominal pain , fever ,CT abdomen suggested a perforation and she was taken to the OR and underwent a hartmans procedure and abscess in pelvis was drained on 11/02/16  . Post operatively it was noted her colostomy was ischemic , necrotic which appeared to have resolved On 11/18/16 was noted to have a pleural effusion,pneumonia , found to have a DVT,pulmonary embolism , underwent thrombectomy of the pulmonary arteries , IV filter placed.    Interval history   6//2018-  12/02/2016  She is presently undergoing physical therapy- today she walked 200 feet. She does get short of breath when she walks. Never had a colonoscopy . She is on eloquis for her DVT/PE. No issues with her colostomy - passing brown stool.     Past Medical History:  Diagnosis Date  . Diabetes ([place])   . Diverticulitis   . Hypertension   . 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[facility])     Past Surgical History:  Procedure Laterality Date  . CARPAL TUNNEL RELEASE Bilateral   . COLON RESECTION SIGMOID N/A 11/02/2016   Procedure: COLON RESECTION SIGMOID;  Surgeon: [name], MD;  Location: [name] ORS;  Service: General;  Laterality: N/A;  . COLOSTOMY N/A 11/02/2016   Procedure: COLOSTOMY;  Surgeon: [name], MD;   Location: [name] ORS;  Service: General;  Laterality: N/A;  . KNEE SURGERY     torn menicus  . PULMONARY VENOGRAPHY N/A 11/19/2016   Procedure: Pulmonary Venography; IVC filter placement; possible pulmonary thrombectomy;  Surgeon: Katha Cabal, MD;  Location: [facility];  Service: Cardiovascular;  Laterality: N/A;  . SIGMOIDOSCOPY N/A 11/13/2016   Procedure: endoscopic  flexible SIGMOIDOSCOPY;  Surgeon: Florene Glen, MD;  Location: [name] ORS;  Service: General;  Laterality: N/A;    Prior to Admission medications   Medication Sig Start Date End Date Taking? Authorizing Provider  apixaban (ELIQUIS) 5 MG TABS tablet 10 mg PO BID X 13 days then start 5 mg PO BID. 11/22/16  Yes Sainani, Belia Heman, MD  aspirin EC 81 MG tablet Take 81 mg by mouth daily.   Yes [provider]  cyclobenzaprine (FLEXERIL) 10 MG tablet Take 10 mg by mouth daily as needed. 07/05/16  Yes [provider]  diclofenac (VOLTAREN) 75 MG EC tablet Take 75 mg by mouth daily. 07/05/16  Yes [provider]  feeding supplement, ENSURE ENLIVE, (ENSURE ENLIVE) LIQD Take 237 mLs by mouth 3 (three) times daily between meals. 11/14/16  Yes Florene Glen, MD  glipiZIDE (GLUCOTROL XL) 2.5 MG 24 hr tablet  10/27/16  Yes [provider]  Hydrocodone-Acetaminophen (VICODIN) 5-300 MG TABS Take 1 tablet by mouth every 4 (four) hours as needed. 11/22/16  Yes Henreitta Leber, MD  losartan (COZAAR) 50 MG tablet Take 1 tablet by mouth daily. 11/02/16 11/02/17 Yes [provider]  Multiple Vitamin (MULTI-VITAMINS) TABS Take 1 tablet by mouth daily.   Yes [provider]    Family History  Problem Relation Age of Onset  . Diabetes Mother   . Heart disease Mother   . Cancer Mother   . Heart disease Father   . Diabetes Sister   . Heart disease Sister      Social History  Substance Use Topics  . Smoking status: Never Smoker  . Smokeless tobacco: Never Used  . Alcohol use No     Allergies as of 12/02/2016 - Review Complete 12/02/2016  Allergen Reaction Noted  . Latex  10/03/2016    Review of Systems:    All systems reviewed and negative except where noted in HPI.   Physical Exam:  BP 112/65 (Cuff Size: Normal)   Pulse 95   Temp 97.9 F (36.6 C) (Oral)   Ht 5\' 3"  (1.6 m)   Wt 221 lb 12.8 oz (100.6 kg)   BMI 39.29 kg/m  No LMP recorded. Patient is postmenopausal. Psych:  Alert and cooperative. Normal mood and affect.in a wheel chair  General:   Alert,  Well-developed, well-nourished, pleasant and cooperative in NAD Head:  Normocephalic and atraumatic. Eyes:  Sclera clear, no icterus.   Conjunctiva pink. Ears:  Normal auditory acuity. Nose:  No deformity, discharge, or lesions. Mouth:  No deformity or lesions,oropharynx pink & moist. Neck:  Supple; no masses or thyromegaly. Lungs:  Respirations even and unlabored.  Clear throughout to auscultation.   No wheezes, crackles, or rhonchi. No acute distress. Heart:  Regular rate and rhythm; no murmurs, clicks, rubs, or gallops. Abdomen:  Large central abdominal scar, LLQ colostomy bag - appears healthy, no abdominal tenderness , BS+ Msk:  Symmetrical without gross deformities. Good, equal movement & strength bilaterally. Pulses:  Normal pulses noted. Extremities: 1+b/l pedal edema . Neurologic:  Alert and oriented x3;  grossly normal neurologically. Psych:  Alert and cooperative. Normal mood and affect.  Imaging Studies: Dg Chest 2 View  Result Date: 11/17/2016 CLINICAL DATA:  Shortness of breath EXAM: CHEST  2 VIEW COMPARISON:  08/24/2016 FINDINGS: Low lung volumes with slightly elevated right diaphragm. Probable small pleural effusions on the lateral view. Atelectasis or infiltrate at the left lung base. Stable cardiomediastinal silhouette. No pneumothorax. IMPRESSION: Low lung volumes. Mild focal opacity at the left lung base may reflect atelectasis or a pneumonia. Probable small pleural effusions.  Electronically Signed   By: Donavan Foil M.D.   On: 11/17/2016 23:59   Dg Abd 1 View  Result Date: 11/05/2016 CLINICAL DATA:  Nasogastric tube placement. EXAM: ABDOMEN - 1 VIEW COMPARISON:  11/01/2016 CT FINDINGS: Nasogastric tube is in place, tip overlying the level of the distal stomach. Surgical clips overlie the lower central abdomen. A tube is coiled over the lower pelvis, incompletely evaluated. Visualized bowel gas pattern is nonobstructive. No evidence for free intraperitoneal air. IMPRESSION: Nasogastric tube tip overlying the level the distal stomach. Electronically Signed   By: Nolon Nations M.D.   On: 11/05/2016 10:35   Ct Angio Chest Pe W Or Wo Contrast  Result Date: 11/18/2016 CLINICAL DATA:  Shortness of breath and leg swelling EXAM: CT ANGIOGRAPHY CHEST WITH CONTRAST TECHNIQUE: Multidetector CT imaging of the chest was performed using the standard protocol during bolus administration of intravenous contrast. Multiplanar CT image reconstructions and MIPs were obtained  to evaluate the vascular anatomy. CONTRAST:  75 mL Isovue 370. COMPARISON:  None. FINDINGS: Cardiovascular: Atherosclerotic calcifications of the thoracic aorta are noted. No aneurysmal dilatation or dissection is seen. The pulmonary artery is well visualized and demonstrates evidence of a large pulmonary embolus involving the left lower lobe pulmonary arterial branches as well as similar findings in the right lower lobe. No upper lobe emboli are seen. No right heart strain is noted. Mediastinum/Nodes: No significant hilar or mediastinal adenopathy is seen. The thoracic inlet is within normal limits. The esophagus is within normal limits. Lungs/Pleura: Patchy infiltrates are noted within the lower lobes bilaterally slightly worse on the left than the right. Small left pleural effusion is seen. Upper Abdomen: The upper abdomen demonstrates no acute abnormality. Some mild changes of anasarca are noted in the lateral abdominal  wall bilaterally. Musculoskeletal: Degenerative changes of the thoracic spine are noted. Review of the MIP images confirms the above findings. IMPRESSION: 1. Bilateral lower lobe pulmonary emboli slightly greater on the left than the right. Associated atelectatic changes and small left pleural effusion are noted. No right heart strain is noted Aortic Atherosclerosis (ICD10-I70.0). Critical Value/emergent results were called by telephone at the time of interpretation on 11/18/2016 at 3:31 pm to Dr. Theodoro Grist , who verbally acknowledged these results. Electronically Signed   By: Inez Catalina M.D.   On: 11/18/2016 15:31   US Venous Img Lower Bilateral  Result Date: 11/18/2016 CLINICAL DATA:  Bilateral lower extremity swelling. EXAM: BILATERAL LOWER EXTREMITY VENOUS DOPPLER ULTRASOUND TECHNIQUE: Gray-scale sonography with graded compression, as well as color Doppler and duplex ultrasound were performed to evaluate the lower extremity deep venous systems from the level of the common femoral vein and including the common femoral, femoral, profunda femoral, popliteal and calf veins including the posterior tibial, peroneal and gastrocnemius veins when visible. The superficial great saphenous vein was also interrogated. Spectral Doppler was utilized to evaluate flow at rest and with distal augmentation maneuvers in the common femoral, femoral and popliteal veins. COMPARISON:  No recent prior. FINDINGS: RIGHT LOWER EXTREMITY Common Femoral Vein: No evidence of thrombus. Normal compressibility, respiratory phasicity and response to augmentation. Saphenofemoral Junction: No evidence of thrombus. Normal compressibility and flow on color Doppler imaging. Profunda Femoral Vein: No evidence of thrombus. Normal compressibility and flow on color Doppler imaging. Femoral Vein: No evidence of thrombus. Normal compressibility, respiratory phasicity and response to augmentation. Popliteal Vein: No evidence of thrombus. Normal  compressibility, respiratory phasicity and response to augmentation. Calf Veins: Thrombus noted within the right posterior tibial peroneal veins. Superficial Great Saphenous Vein: No evidence of thrombus. Normal compressibility and flow on color Doppler imaging. Other Findings:  None. LEFT LOWER EXTREMITY Common Femoral Vein: No evidence of thrombus. Normal compressibility, respiratory phasicity and response to augmentation. Saphenofemoral Junction: No evidence of thrombus. Normal compressibility and flow on color Doppler imaging. Profunda Femoral Vein: No evidence of thrombus. Normal compressibility and flow on color Doppler imaging. Femoral Vein: No evidence of thrombus. Normal compressibility, respiratory phasicity and response to augmentation. Popliteal Vein: Thrombus noted in the popliteal vein . Calf Veins: Thrombus noted in the left posterior tibial and peroneal veins. Superficial Great Saphenous Vein: No evidence of thrombus. Normal compressibility and flow on color Doppler imaging. Other Findings:  None. IMPRESSION: Positive study for bilateral lower extremity DVT. Thrombus in the right posterior tibial and peroneal veins. Thrombus noted in the left popliteal and left posterior tibial and peroneal veins. Electronically Signed   By: Marcello Moores  Register  On: 11/18/2016 13:32    Assessment and Plan:   Abia Monaco is a 66 y.o. y/o female has been referred for a colonoscopy after an episode of perforated diverticulitis, S/p Hartmans procedure , complicated with a DVT,PE requiring thrombectomy .   She has not followed with Dr Adonis Huguenin or Dr Ginette Pitman  Since discharge. On Eloquis for anticoagulation .  At this time I do not think we can proceed with her colonoscopy due to the acute pulmonary embolism. I will have my office get in touch with Dr Kerby Moors office to let us know when she can come off the blood thinners for a few days for the colonoscopy, presently still short of breath on exertion , may  need an echo to r/o heart strain  , In addition she will need Anesthesia pre op clearance as she would be high risk for the procedure. I also suggest she follows up with Dr Raiford Noble office as she had questions regarding colostomy supplies.    I have discussed alternative options, risks & benefits,  which include, but are not limited to, bleeding, infection, perforation,respiratory complication & drug reaction.  The patient agrees with this plan & written consent will be obtained.      Follow up in 3 months   Dr Jonathon Bellows MD,MRCP(U.K)

## 2016-12-02 NOTE — Telephone Encounter (Signed)
Returned phone call to patient at this time. Patient states that she will have [facility] as her provider at home. I will touch base with their office tomorrow and ask for them to assist patient in supplies.  She also would like to know if there is a disc that goes inside of the colostomy bag to cut down on the smell as she is losing her appetite while eating due to the smell. I have emailed Domenic Moras, our ostomy nurse at this time and will get back to the patient as soon as I receive and answer from both resources.

## 2016-12-03 ENCOUNTER — Other Ambulatory Visit: Payer: Self-pay | Admitting: *Deleted

## 2016-12-03 DIAGNOSIS — I5031 Acute diastolic (congestive) heart failure: Secondary | ICD-10-CM

## 2016-12-03 NOTE — Telephone Encounter (Signed)
Spoke with Summer from [employer]. She has touched base with patient's [facility] nurse at [facility] and they will ensure that patient has everything that she needs prior to discharge from facility.  Received notification from [name] in regards to the item used to reduce smell of Colostomy. She states that there are Colostomy bags made with Charcoal Filters that will not allow smell to escape unless there is a leak present. Also there are also liquid deodorant that is made specifically for this purpose. However, in her experience the charcoal filtered bags are much more effective at reducing smell. I have asked her about how to obtain these products and I am awaiting response currently.

## 2016-12-03 NOTE — Patient Outreach (Signed)
[facility]) Care Management  12/03/2016  Kara [name] 1950/08/29 [id number]   Call from Drue Novel, SW at facility. She states this patient is discharging from facility today. Patient insurance was listed as Christella Scheuermann as primary but she has [employer] as primary.   She reports patient is discharging home today. She will go home with spouse and an adult son.  She states that patient has new temporary colostomy. She was given education at facility around ostomy care.   She would like to refer patient to [facility] care management services to follow up around home care and ostomy supply coverage.  [facility].   Plan to refer to [facility] short term care management for transition of care.  Royetta Crochet. Laymond Purser, RN, BSN, [place] [phone]) Business Cell  [phone]) Toll Free Office

## 2016-12-04 DIAGNOSIS — Z7984 Long term (current) use of oral hypoglycemic drugs: Secondary | ICD-10-CM | POA: Diagnosis not present

## 2016-12-04 DIAGNOSIS — E119 Type 2 diabetes mellitus without complications: Secondary | ICD-10-CM | POA: Diagnosis not present

## 2016-12-04 DIAGNOSIS — R2689 Other abnormalities of gait and mobility: Secondary | ICD-10-CM | POA: Diagnosis not present

## 2016-12-04 DIAGNOSIS — I5032 Chronic diastolic (congestive) heart failure: Secondary | ICD-10-CM | POA: Diagnosis not present

## 2016-12-04 DIAGNOSIS — Z7901 Long term (current) use of anticoagulants: Secondary | ICD-10-CM | POA: Diagnosis not present

## 2016-12-04 DIAGNOSIS — Z433 Encounter for attention to colostomy: Secondary | ICD-10-CM | POA: Diagnosis not present

## 2016-12-04 DIAGNOSIS — I110 Hypertensive heart disease with heart failure: Secondary | ICD-10-CM | POA: Diagnosis not present

## 2016-12-04 DIAGNOSIS — I2699 Other pulmonary embolism without acute cor pulmonale: Secondary | ICD-10-CM | POA: Diagnosis not present

## 2016-12-04 DIAGNOSIS — M6281 Muscle weakness (generalized): Secondary | ICD-10-CM | POA: Diagnosis not present

## 2016-12-06 ENCOUNTER — Other Ambulatory Visit: Payer: Self-pay

## 2016-12-06 NOTE — Patient Outreach (Signed)
[facility]) Care Management  12/06/2016  Kara [name] Kara [name] 07/26/1950 [id number]  Short term Transition of care  Week # 1 Referral date: 12/03/16 Referral source: Status post discharge from [facility] rehab center. Program:  Transition of care Insurance: Health team advantage Providers: Dr. Roetta Sessions,   Dr. Joan Flores Social support: Patient lives with spouse and son  SUBJECTIVE: Telephone call to patient regarding transition of care follow up. HIPAA verified with patient. Discussed transition of care follow up program. Patient verbally agreed.   Patient states within the last month she went in to the hospital due to problems with her intestines. Patient states she had a temporary colostomy place and was transferred to [facility] center for rehab services. Patient states while at the rehab facility she developed shortness of breath symptoms and was taken to the hospital. Patient states she was found to have pulmonary embolism, heart failure, and pneumonia.  Patient states she had a filter placed due to the blood clots.  Patient states she was transferred back to [facility] so that she could complete her therapy. Patient states she was discharged from [facility] on 12/07/16.   Patient states she has home health services with [facility]. Patient states she has ordered supplies through her health team advantage insurance and was able to have someone pick up additional colostomy supplies today.  Patient states she has all of her medications and is taking her medications as prescribed.  Patient reports having a follow up appointment scheduled with the surgeon. Patient states she does not have a follow up with her primary MD at this time but will call and set up.  Patient states she has a strong support system with her husband and son. States they provide her transportation to her appointments.  RNCM discussed signs/ symptoms of infection with   patient. Advised to notify doctor of symptoms. RNCM advised patient to take all medications as prescribed.  Patient states she has new diagnosis of congestive heart failure.  RNCM discussed heart failure symptoms and heart failure action plan.  Advised to call doctor for heart failure symptoms and call 911 for severe symptoms.   RNCM advised patient to notify MD of any changes in condition prior to scheduled appointment. RNCM provided contact name and number: [phone] or main office number [phone] and 24 hour nurse advise line [phone].  RNCM verified patient aware of 911 services for urgent/ emergent needs.   PLAN:  RNCM will follow up with patient within 1 week. RNCM will send patient [place] management welcome packet, consent  RNCM will send patients primary MD involvement letter.   [name] RN,BSN,CCM [facility] Telephonic  [phone]

## 2016-12-07 ENCOUNTER — Other Ambulatory Visit: Payer: Self-pay | Admitting: *Deleted

## 2016-12-07 NOTE — Patient Outreach (Signed)
[facility]) Care Management  12/07/2016  Kara [name] 12/08/1950 [id number]   Patient referred to this social worker to assess patient's support in the home following her discharge from [facility]. This Education officer, museum spoke to telephonic nurse who was able to confirm with  patient a very strong support system with her husband and son who provide the transportation to all of her follow up medical appointments.     Plan: Strong support system described, no further social work intervention needed.   Sheralyn Boatman [facility] Care Management [phone]

## 2016-12-08 DIAGNOSIS — Z433 Encounter for attention to colostomy: Secondary | ICD-10-CM | POA: Diagnosis not present

## 2016-12-08 NOTE — Pre-Procedure Instructions (Signed)
DR A KARENZ WENT OVER HISTORY. STATES PATIENT NEEDS MEDICAL CLEARANCE TO PROCEED WITH COLONOSCOPY.FAXED NOTE TO DR ANNA.

## 2016-12-14 ENCOUNTER — Other Ambulatory Visit: Payer: Self-pay

## 2016-12-14 NOTE — Patient Outreach (Signed)
[place] [facility]) Care Management  12/14/2016  Kara [name] 10-22-1950 [id number]  Short term Transition of care  Week # 2 Referral date: 12/03/16 Referral source: Status post discharge from [facility] rehab center. Program:  Transition of care Insurance: Health team advantage Providers: Dr. Roetta Sessions,   Dr. Joan Flores Social support: Patient lives with spouse and son Attempt #1  SUBJECTIVE; Telephone call to patient regarding transition of care follow up.  Patient reports she is not feeling good this morning. Stated she woke up this morning feeling nauseated and has just returned to bed.  Patient states she is not sure why she feels this way.  Patient request call back at another time.  RNCM advised patient to eat cracker and/or ginger ale. Patient states she has 7 up she will try.   PLAN:  RNCM will attempt 2nd telephone outreach to patient within 1 week.   [name] RN,BSN,CCM [facility] Telephonic  [phone]

## 2016-12-15 ENCOUNTER — Other Ambulatory Visit: Payer: Self-pay

## 2016-12-15 ENCOUNTER — Telehealth: Payer: Self-pay

## 2016-12-15 NOTE — Telephone Encounter (Signed)
Advised patient that clearance was received from Dr. Ginette Pitman. Xarelto stop 5 days prior start 1 day after.  Patient concerned that HGB may be low and will request that [facility] nurse take labs.  Patient also going to speak to Dr. Adonis Huguenin about dizziness felt upon standing.  Patient to callback and schedule procedure after treating dizziness.

## 2016-12-15 NOTE — Patient Outreach (Signed)
[facility]) Care Management  12/15/2016  Kara [name] November 22, 1950 [id number]  Short term Transition of care  Week # 2 Referral date: 12/03/16 Referral source: Status post discharge from [facility] rehab center. Program: Transition of care Insurance: Health team advantage Providers: Dr. Roetta Sessions, Dr. Joan Flores Social support: Patient lives with spouse and son Attempt #2  Second telephone call to patient for transition of care follow up. Patient states she is unable to talk right now due to home physical therapist being there.    PLAN:  RNCM will attempt 3rd  telephone call to patient within 1 week.    [name] RN,BSN,CCM [facility] Telephonic  [phone]

## 2016-12-16 DIAGNOSIS — R8290 Unspecified abnormal findings in urine: Secondary | ICD-10-CM | POA: Diagnosis not present

## 2016-12-16 DIAGNOSIS — I2699 Other pulmonary embolism without acute cor pulmonale: Secondary | ICD-10-CM | POA: Diagnosis not present

## 2016-12-16 DIAGNOSIS — E119 Type 2 diabetes mellitus without complications: Secondary | ICD-10-CM | POA: Diagnosis not present

## 2016-12-16 DIAGNOSIS — R531 Weakness: Secondary | ICD-10-CM | POA: Diagnosis not present

## 2016-12-16 DIAGNOSIS — I10 Essential (primary) hypertension: Secondary | ICD-10-CM | POA: Diagnosis not present

## 2016-12-16 DIAGNOSIS — K5792 Diverticulitis of intestine, part unspecified, without perforation or abscess without bleeding: Secondary | ICD-10-CM | POA: Diagnosis not present

## 2016-12-16 DIAGNOSIS — Z933 Colostomy status: Secondary | ICD-10-CM | POA: Diagnosis not present

## 2016-12-20 ENCOUNTER — Telehealth: Payer: Self-pay

## 2016-12-20 NOTE — Telephone Encounter (Signed)
LVM for patient callback to schedule colonoscopy (since medical clearances have been obtained)  Patient has receive procedure clearance from Dr. Ginette Pitman.  Medical clearance for Eliquis received. Stop 2 days prior and restart 1 day following.

## 2016-12-23 DIAGNOSIS — R2689 Other abnormalities of gait and mobility: Secondary | ICD-10-CM | POA: Diagnosis not present

## 2016-12-23 DIAGNOSIS — Z7901 Long term (current) use of anticoagulants: Secondary | ICD-10-CM | POA: Diagnosis not present

## 2016-12-23 DIAGNOSIS — Z7984 Long term (current) use of oral hypoglycemic drugs: Secondary | ICD-10-CM | POA: Diagnosis not present

## 2016-12-23 DIAGNOSIS — Z433 Encounter for attention to colostomy: Secondary | ICD-10-CM | POA: Diagnosis not present

## 2016-12-23 DIAGNOSIS — M6281 Muscle weakness (generalized): Secondary | ICD-10-CM | POA: Diagnosis not present

## 2016-12-23 DIAGNOSIS — E119 Type 2 diabetes mellitus without complications: Secondary | ICD-10-CM | POA: Diagnosis not present

## 2016-12-23 DIAGNOSIS — I2699 Other pulmonary embolism without acute cor pulmonale: Secondary | ICD-10-CM | POA: Diagnosis not present

## 2016-12-23 DIAGNOSIS — I110 Hypertensive heart disease with heart failure: Secondary | ICD-10-CM | POA: Diagnosis not present

## 2016-12-23 DIAGNOSIS — I5032 Chronic diastolic (congestive) heart failure: Secondary | ICD-10-CM | POA: Diagnosis not present

## 2016-12-24 ENCOUNTER — Ambulatory Visit: Payer: Self-pay

## 2016-12-27 ENCOUNTER — Ambulatory Visit (INDEPENDENT_AMBULATORY_CARE_PROVIDER_SITE_OTHER): Payer: PPO | Admitting: General Surgery

## 2016-12-27 VITALS — BP 138/81 | HR 101 | Temp 98.3°F | Wt 220.0 lb

## 2016-12-27 DIAGNOSIS — Z4889 Encounter for other specified surgical aftercare: Secondary | ICD-10-CM

## 2016-12-27 NOTE — Patient Instructions (Addendum)
We will contact Dr. Georgeann Oppenheim office so they could call you to schedule your Colonoscopy.. After this is done, you will need to come back to see Dr. Adonis Huguenin in order to discuss a possible colostomy reversal.

## 2016-12-27 NOTE — Progress Notes (Signed)
Outpatient Surgical Follow Up  12/27/2016  Kara [name] is an 66 y.o. female.   Chief Complaint  Patient presents with  . 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 She reports that she continues to improve. Since her last visit she had a readdmission secondary to DVT and PE in which she received an IVC filter. She reports that her oral intake has been steadily improving and her ostomy has been functioning well. She denies any current fevers, chills, nausea, vomiting, chest pain or shortness of breath. She continues to work with PT to regain her strength though. She questions today about whether or not she needs a follow up colonoscopy.  Past Medical History:  Diagnosis Date  . Diabetes ([place])   . Diverticulitis   . Hypertension   . 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[facility])     Past Surgical History:  Procedure Laterality Date  . CARPAL TUNNEL RELEASE Bilateral   . COLON RESECTION SIGMOID N/A 11/02/2016   Procedure: COLON RESECTION SIGMOID;  Surgeon: [name], MD;  Location: [name] ORS;  Service: General;  Laterality: N/A;  . COLOSTOMY N/A 11/02/2016   Procedure: COLOSTOMY;  Surgeon: [name], MD;  Location: [name] ORS;  Service: General;  Laterality: N/A;  . KNEE SURGERY     torn menicus  . PULMONARY VENOGRAPHY N/A 11/19/2016   Procedure: Pulmonary Venography; IVC filter placement; possible pulmonary thrombectomy;  Surgeon: Katha Cabal, MD;  Location: [place] CV LAB;  Service: Cardiovascular;  Laterality: N/A;  . SIGMOIDOSCOPY N/A 11/13/2016   Procedure: endoscopic  flexible SIGMOIDOSCOPY;  Surgeon: Florene Glen, MD;  Location: [name] ORS;  Service: General;  Laterality: N/A;    Family History  Problem Relation Age of Onset  . Diabetes Mother   . Heart disease Mother   . Cancer Mother   . Heart disease Father   . Diabetes  Sister   . Heart disease Sister     Social History:  reports that she has never smoked. She has never used smokeless tobacco. She reports that she does not drink alcohol or use drugs.  Allergies:  Allergies  Allergen Reactions  . Latex   . Morphine And Related Nausea And Vomiting    Medications reviewed.    ROS Mutlipoint ROS was completed, all pertinent positives and negatives are in the HPI, the remainder are negative.   BP 138/81   Pulse (!) 101   Temp 98.3 F (36.8 C) (Oral)   Wt 99.8 kg (220 lb)   BMI 38.97 kg/m   Physical Exam Gen NAD Resp: CTA CV: RRR Abd: Soft, NT, ND. Well healed midline incision and healthy functioning left upper quadrant colostomy.    No results found for this or any previous visit (from the past 48 hour(s)). No results found.  Assessment/Plan:  1. Aftercare following surgery 66 year old female with end colostomy whose post operative course has been complicated by DVT, PE and need for physical therapy. She is gradually improving. Discussed importance of colonoscopy and that 3 months post op is the optimum timing for her case. Afterwhich we will discuss with her the timing of her colostomy reversal. Patient voiced understanding and will return to clinic after her colonoscopy to discuss her ostomy takedown assuming continued improvement in function.     [name], MD FACS General Surgeon  12/27/2016,7:46 PM

## 2016-12-31 ENCOUNTER — Other Ambulatory Visit: Payer: Self-pay

## 2016-12-31 NOTE — Patient Outreach (Signed)
[facility]) Care Management  12/31/2016  Kara [name] 05-20-1951 [id number]  Short term Transition of care  Week # 2 Referral date: 12/03/16 Referral source: Status post discharge from [facility] rehab center. Program: Transition of care Insurance: Health team advantage Providers: Dr. Roetta Sessions, Dr. Joan Flores Social support: Patient lives with spouse and son Attempt #3  Telephone call to patient regarding transition of care follow up.  Unable to reach patient. HIPAA compliant voice message left with call back phone number.   PLAN; RNCM will send patient outreach letter to attempt contact.   [name] RN,BSN,CCM [facility] Telephonic  [phone]

## 2017-01-04 ENCOUNTER — Telehealth: Payer: Self-pay

## 2017-01-04 NOTE — Telephone Encounter (Signed)
Patient called stating that today was her home health nurse last day. However her home health nurse said to her that her "hole is getting smaller". Patient is concerned to where her bowel will go if it does close. I offered the patient an appointment to be seen by Dr. Burt Knack this week. Patient declined appointment at this time said that she will "keep a watch on it" due to waiting to be scheduled for a colonoscopy by the end of this month.Patient verbalized understanding at this time and told me that if she felt the need to make an appointment to be seen that she will give our office a call.

## 2017-01-09 NOTE — Telephone Encounter (Signed)
This encounter was created in error - please disregard.

## 2017-01-11 ENCOUNTER — Telehealth: Payer: Self-pay | Admitting: Gastroenterology

## 2017-01-11 NOTE — Telephone Encounter (Signed)
Patient called and is ready to schedule her colonoscopy. 

## 2017-01-12 ENCOUNTER — Other Ambulatory Visit: Payer: Self-pay

## 2017-01-12 DIAGNOSIS — Z1211 Encounter for screening for malignant neoplasm of colon: Secondary | ICD-10-CM

## 2017-01-12 NOTE — Progress Notes (Signed)
Gastroenterology Pre-Procedure Review  Request Date: 02/03/17 Requesting Physician: Dr. Marius Ditch  PATIENT REVIEW QUESTIONS: The patient responded to the following health history questions as indicated:    1. Are you having any GI issues? no 2. Do you have a personal history of Polyps? no 3. Do you have a family history of Colon Cancer or Polyps? no 4. Diabetes Mellitus? yes (Type 2) 5. Joint replacements in the past 12 months?no 6. Major health problems in the past 3 months?yes (Colon Resection 11/02/16) 7. Any artificial heart valves, MVP, or defibrillator?no    MEDICATIONS & ALLERGIES:    Patient reports the following regarding taking any anticoagulation/antiplatelet therapy:   Plavix, Coumadin, Eliquis, Xarelto, Lovenox, Pradaxa, Brilinta, or Effient? yes (Xarelto Prescribed by Dr. Ginette Pitman Blood thinner request sent to [facility]) Aspirin? yes  Patient confirms/reports the following medications:  Current Outpatient Prescriptions  Medication Sig Dispense Refill  . aspirin EC 81 MG tablet Take 81 mg by mouth daily.    . cyclobenzaprine (FLEXERIL) 10 MG tablet Take 10 mg by mouth daily as needed.    . diclofenac (VOLTAREN) 75 MG EC tablet Take 75 mg by mouth daily.    Marland Kitchen glipiZIDE (GLUCOTROL XL) 2.5 MG 24 hr tablet     . Hydrocodone-Acetaminophen (VICODIN) 5-300 MG TABS Take 1 tablet by mouth every 4 (four) hours as needed. 15 each 0  . losartan (COZAAR) 50 MG tablet Take 1 tablet by mouth daily.    . Multiple Vitamin (MULTI-VITAMINS) TABS Take 1 tablet by mouth daily.    . rivaroxaban (XARELTO) 20 MG TABS tablet Take by mouth.     No current facility-administered medications for this visit.     Patient confirms/reports the following allergies:  Allergies  Allergen Reactions  . Latex   . Morphine And Related Nausea And Vomiting    No orders of the defined types were placed in this encounter.   AUTHORIZATION INFORMATION Primary Insurance: 1D#: Group #:  Secondary  Insurance: 1D#: Group #:  SCHEDULE INFORMATION: Date: 02/03/17 Time: Location:[name]

## 2017-01-13 DIAGNOSIS — M4726 Other spondylosis with radiculopathy, lumbar region: Secondary | ICD-10-CM | POA: Diagnosis not present

## 2017-01-13 DIAGNOSIS — M5136 Other intervertebral disc degeneration, lumbar region: Secondary | ICD-10-CM | POA: Diagnosis not present

## 2017-01-13 DIAGNOSIS — M48061 Spinal stenosis, lumbar region without neurogenic claudication: Secondary | ICD-10-CM | POA: Diagnosis not present

## 2017-01-17 DIAGNOSIS — I110 Hypertensive heart disease with heart failure: Secondary | ICD-10-CM | POA: Diagnosis not present

## 2017-01-17 DIAGNOSIS — E119 Type 2 diabetes mellitus without complications: Secondary | ICD-10-CM | POA: Diagnosis not present

## 2017-01-17 DIAGNOSIS — I5032 Chronic diastolic (congestive) heart failure: Secondary | ICD-10-CM | POA: Diagnosis not present

## 2017-01-17 DIAGNOSIS — Z433 Encounter for attention to colostomy: Secondary | ICD-10-CM | POA: Diagnosis not present

## 2017-01-17 DIAGNOSIS — M6281 Muscle weakness (generalized): Secondary | ICD-10-CM | POA: Diagnosis not present

## 2017-01-17 DIAGNOSIS — I2699 Other pulmonary embolism without acute cor pulmonale: Secondary | ICD-10-CM | POA: Diagnosis not present

## 2017-01-27 ENCOUNTER — Other Ambulatory Visit: Payer: Self-pay

## 2017-01-27 NOTE — Patient Outreach (Addendum)
[place] [facility]) Care Management  01/27/2017  Kara [name] Dax 07-05-1950 [id number]  Case closure Referral date: 12/03/16 Referral source: Status post discharge from [facility] rehab center. Program: Transition of care Insurance: Health team advantage Providers: Dr. Roetta Sessions, Dr. Joan Flores Social support: Patient lives with spouse and son  No response from patient after 3 telephone calls and letter outreach.  PLAN; RNCM will refer patient to care management assistant to close due to being unable to contact. RNCM will notify patients primary MD of closure. RNCM will send case closure letter to patient  [name] RN,BSN,CCM [facility] Telephonic  [phone]

## 2017-01-31 ENCOUNTER — Telehealth: Payer: Self-pay

## 2017-01-31 DIAGNOSIS — Z433 Encounter for attention to colostomy: Secondary | ICD-10-CM | POA: Diagnosis not present

## 2017-01-31 NOTE — Telephone Encounter (Signed)
Patient received clearance for colonoscopy. She was advised on Friday to stop Xarelto 5 days before colonoscopy and to resume 1 ay after. Thanks [employer]

## 2017-02-03 ENCOUNTER — Encounter
Admission: RE | Disposition: A | Discharge status: Home / Self Care | Payer: Self-pay | Source: Ambulatory Visit | Attending: Gastroenterology

## 2017-02-03 ENCOUNTER — Ambulatory Visit
Admission: RE | Admit: 2017-02-03 | Discharge: 2017-02-03 | Disposition: A | Discharge status: Home / Self Care | Payer: PPO | Source: Ambulatory Visit | Attending: Gastroenterology | Admitting: Gastroenterology

## 2017-02-03 ENCOUNTER — Ambulatory Visit: Admit: 2017-02-03 | Payer: Managed Care, Other (non HMO)

## 2017-02-03 ENCOUNTER — Ambulatory Visit: Payer: PPO | Admitting: Anesthesiology

## 2017-02-03 ENCOUNTER — Telehealth: Payer: Self-pay

## 2017-02-03 DIAGNOSIS — Z7984 Long term (current) use of oral hypoglycemic drugs: Secondary | ICD-10-CM | POA: Diagnosis not present

## 2017-02-03 DIAGNOSIS — I110 Hypertensive heart disease with heart failure: Secondary | ICD-10-CM | POA: Diagnosis not present

## 2017-02-03 DIAGNOSIS — Z9049 Acquired absence of other specified parts of digestive tract: Secondary | ICD-10-CM | POA: Diagnosis not present

## 2017-02-03 DIAGNOSIS — Z7901 Long term (current) use of anticoagulants: Secondary | ICD-10-CM | POA: Diagnosis not present

## 2017-02-03 DIAGNOSIS — Z791 Long term (current) use of non-steroidal anti-inflammatories (NSAID): Secondary | ICD-10-CM | POA: Insufficient documentation

## 2017-02-03 DIAGNOSIS — I509 Heart failure, unspecified: Secondary | ICD-10-CM | POA: Diagnosis not present

## 2017-02-03 DIAGNOSIS — E119 Type 2 diabetes mellitus without complications: Secondary | ICD-10-CM | POA: Insufficient documentation

## 2017-02-03 DIAGNOSIS — K9403 Colostomy malfunction: Secondary | ICD-10-CM | POA: Insufficient documentation

## 2017-02-03 DIAGNOSIS — Y9289 Other specified places as the place of occurrence of the external cause: Secondary | ICD-10-CM | POA: Diagnosis not present

## 2017-02-03 DIAGNOSIS — Z1211 Encounter for screening for malignant neoplasm of colon: Secondary | ICD-10-CM | POA: Insufficient documentation

## 2017-02-03 DIAGNOSIS — Z7982 Long term (current) use of aspirin: Secondary | ICD-10-CM | POA: Insufficient documentation

## 2017-02-03 DIAGNOSIS — Z79899 Other long term (current) drug therapy: Secondary | ICD-10-CM | POA: Insufficient documentation

## 2017-02-03 DIAGNOSIS — Z91040 Latex allergy status: Secondary | ICD-10-CM | POA: Diagnosis not present

## 2017-02-03 DIAGNOSIS — Z888 Allergy status to other drugs, medicaments and biological substances status: Secondary | ICD-10-CM | POA: Diagnosis not present

## 2017-02-03 DIAGNOSIS — Y838 Other surgical procedures as the cause of abnormal reaction of the patient, or of later complication, without mention of misadventure at the time of the procedure: Secondary | ICD-10-CM | POA: Diagnosis not present

## 2017-02-03 DIAGNOSIS — Z5309 Procedure and treatment not carried out because of other contraindication: Secondary | ICD-10-CM | POA: Insufficient documentation

## 2017-02-03 DIAGNOSIS — M1990 Unspecified osteoarthritis, unspecified site: Secondary | ICD-10-CM | POA: Diagnosis not present

## 2017-02-03 HISTORY — PX: COLONOSCOPY WITH PROPOFOL: SHX5780

## 2017-02-03 LAB — GLUCOSE, CAPILLARY: GLUCOSE-CAPILLARY: 125 mg/dL — AB (ref 65–99)

## 2017-02-03 SURGERY — COLONOSCOPY WITH PROPOFOL
Anesthesia: General

## 2017-02-03 MED ORDER — SODIUM CHLORIDE 0.9 % IV SOLN
INTRAVENOUS | Status: DC
  Administered 2017-02-03: 1000 mL via INTRAVENOUS

## 2017-02-03 MED ORDER — FENTANYL CITRATE (PF) 100 MCG/2ML IJ SOLN
INTRAMUSCULAR | Status: DC | PRN
  Administered 2017-02-03: 50 ug via INTRAVENOUS

## 2017-02-03 MED ORDER — FENTANYL CITRATE (PF) 100 MCG/2ML IJ SOLN
INTRAMUSCULAR | Status: AC
  Filled 2017-02-03: qty 2

## 2017-02-03 MED ORDER — PROPOFOL 500 MG/50ML IV EMUL
INTRAVENOUS | Status: AC
  Filled 2017-02-03: qty 50

## 2017-02-03 MED ORDER — MIDAZOLAM HCL 2 MG/2ML IJ SOLN
INTRAMUSCULAR | Status: AC
  Filled 2017-02-03: qty 2

## 2017-02-03 MED ORDER — MIDAZOLAM HCL 2 MG/2ML IJ SOLN
INTRAMUSCULAR | Status: DC | PRN
  Administered 2017-02-03: 2 mg via INTRAVENOUS

## 2017-02-03 MED ORDER — PROPOFOL 500 MG/50ML IV EMUL
INTRAVENOUS | Status: DC | PRN
  Administered 2017-02-03: 120 ug/kg/min via INTRAVENOUS

## 2017-02-03 NOTE — Anesthesia Procedure Notes (Signed)
Performed by: Vaughan Sine Pre-anesthesia Checklist: Patient identified, Emergency Drugs available, Patient being monitored, Suction available and Timeout performed Patient Re-evaluated:Patient Re-evaluated prior to induction Oxygen Delivery Method: Simple face mask Preoxygenation: Pre-oxygenation with 100% oxygen Ventilation: Oral airway inserted - appropriate to patient size Placement Confirmation: positive ETCO2 and CO2 detector

## 2017-02-03 NOTE — Anesthesia Preprocedure Evaluation (Signed)
Anesthesia Evaluation  Patient identified by MRN, date of birth, ID band Patient awake    Reviewed: Allergy & Precautions, NPO status , Patient's Chart, lab work & pertinent test results  Airway Mallampati: II  TM Distance: <3 FB     Dental  (+) Chipped   Pulmonary neg pulmonary ROS, pneumonia, resolved,    Pulmonary exam normal        Cardiovascular hypertension, Pt. on medications +CHF  Normal cardiovascular exam     Neuro/Psych negative neurological ROS  negative psych ROS   GI/Hepatic Neg liver ROS, diverticulitis   Endo/Other  diabetes, Well Controlled, Type 2, Oral Hypoglycemic Agents  Renal/GU negative Renal ROS  negative genitourinary   Musculoskeletal  (+) Arthritis , Osteoarthritis,    Abdominal (+) + obese,   Peds negative pediatric ROS (+)  Hematology   Anesthesia Other Findings Past Medical History: No date: Diabetes mellitus without complication (HCC) No date: Hypertension  Small mouth  Reproductive/Obstetrics                             Anesthesia Physical  Anesthesia Plan  ASA: III  Anesthesia Plan: General   Post-op Pain Management:    Induction: Intravenous  PONV Risk Score and Plan: 4 or greater and Ondansetron, Dexamethasone, Propofol, Midazolam, Scopolamine patch - Pre-op and Treatment may vary due to age or medical condition  Airway Management Planned: Nasal Cannula  Additional Equipment:   Intra-op Plan:   Post-operative Plan:   Informed Consent: I have reviewed the patients History and Physical, chart, labs and discussed the procedure including the risks, benefits and alternatives for the proposed anesthesia with the patient or authorized representative who has indicated his/her understanding and acceptance.   Dental advisory given  Plan Discussed with: CRNA and Surgeon  Anesthesia Plan Comments:         Anesthesia Quick Evaluation

## 2017-02-03 NOTE — Anesthesia Post-op Follow-up Note (Signed)
Anesthesia QCDR form completed.        

## 2017-02-03 NOTE — Progress Notes (Signed)
Colonoscopy is cancelled and pt has severe stomal stenosis of the colostomy. Unable to introduce even my pinky finger. Cannot traverse the upper endoscope to perform stomal dilation either. Recommend stomal revision and refer back to GI for colonoscopy prior to takedown.   [name], MD [address]  [place]  [place], [place] [address]  Main: [phone]  Fax: [phone] Pager: [phone]

## 2017-02-03 NOTE — Anesthesia Postprocedure Evaluation (Signed)
Anesthesia Post Note  Patient: Kara [name]  Procedure(s) Performed: Procedure(s) (LRB): COLONOSCOPY WITH PROPOFOL (N/A)  Patient location during evaluation: PACU Anesthesia Type: General Level of consciousness: awake and alert and oriented Pain management: pain level controlled Vital Signs Assessment: post-procedure vital signs reviewed and stable Respiratory status: spontaneous breathing Cardiovascular status: blood pressure returned to baseline Anesthetic complications: no     Last Vitals:  Vitals:   02/03/17 1100 02/03/17 1110  BP: 128/74 (!) 143/71  Pulse: 81 84  Resp: 18 (!) 21  Temp:    SpO2: 100% 98%    Last Pain:  Vitals:   02/03/17 1050  TempSrc: Tympanic                 Maryann Mccall

## 2017-02-03 NOTE — H&P (Signed)
Kara Darby, MD [address]  [place]  [place], [place] [address]  Main: [phone]  Fax: [phone] Pager: [phone]  Primary Care Physician:  Tracie Harrier, MD Primary Gastroenterologist:  Dr. Jonathon Bellows  Pre-Procedure History & Physical: HPI:  Kara [name] is a 66 y.o. female is here for an colonoscopy.   Past Medical History:  Diagnosis Date  . Diabetes ([place])   . Diverticulitis   . Hypertension   . 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[facility])     Past Surgical History:  Procedure Laterality Date  . CARPAL TUNNEL RELEASE Bilateral   . COLON RESECTION SIGMOID N/A 11/02/2016   Procedure: COLON RESECTION SIGMOID;  Surgeon: [name], MD;  Location: [name] ORS;  Service: General;  Laterality: N/A;  . COLOSTOMY N/A 11/02/2016   Procedure: COLOSTOMY;  Surgeon: [name], MD;  Location: [name] ORS;  Service: General;  Laterality: N/A;  . KNEE SURGERY     torn menicus  . PULMONARY VENOGRAPHY N/A 11/19/2016   Procedure: Pulmonary Venography; IVC filter placement; possible pulmonary thrombectomy;  Surgeon: Katha Cabal, MD;  Location: [place] CV LAB;  Service: Cardiovascular;  Laterality: N/A;  . SIGMOIDOSCOPY N/A 11/13/2016   Procedure: endoscopic  flexible SIGMOIDOSCOPY;  Surgeon: Florene Glen, MD;  Location: [name] ORS;  Service: General;  Laterality: N/A;    Prior to Admission medications   Medication Sig Start Date End Date Taking? Authorizing Provider  apixaban (ELIQUIS) 5 MG TABS tablet Take 5 mg by mouth once.   Yes [provider]  glipiZIDE (GLUCOTROL XL) 2.5 MG 24 hr tablet 5 mg.  10/27/16  Yes [provider]  losartan (COZAAR) 50 MG tablet Take 1 tablet by mouth daily. 11/02/16 11/02/17 Yes [provider]  aspirin EC 81 MG tablet Take 81 mg by mouth daily.    [provider]  cyclobenzaprine (FLEXERIL) 10 MG tablet Take 10 mg by mouth daily as needed. 07/05/16   [provider]    diclofenac (VOLTAREN) 75 MG EC tablet Take 75 mg by mouth daily. 07/05/16   [provider]  Hydrocodone-Acetaminophen (VICODIN) 5-300 MG TABS Take 1 tablet by mouth every 4 (four) hours as needed. Patient not taking: Reported on 02/03/2017 11/22/16   Henreitta Leber, MD  Multiple Vitamin (MULTI-VITAMINS) TABS Take 1 tablet by mouth daily.    [provider]  rivaroxaban (XARELTO) 20 MG TABS tablet Take by mouth. 12/09/16   [provider]    Allergies as of 01/12/2017 - Review Complete 12/27/2016  Allergen Reaction Noted  . Latex  10/03/2016  . Morphine and related Nausea And Vomiting 12/27/2016    Family History  Problem Relation Age of Onset  . Diabetes Mother   . Heart disease Mother   . Cancer Mother   . Heart disease Father   . Diabetes Sister   . Heart disease Sister     Social History   Social History  . Marital status: Married    Spouse name: N/A  . Number of children: N/A  . Years of education: N/A   Occupational History  . Not on file.   Social History Main Topics  . Smoking status: Never Smoker  . Smokeless tobacco: Never Used  . Alcohol use No  . Drug use: No  . Sexual activity: No   Other Topics Concern  . Not on file   Social History Narrative  . No narrative on file    Review of Systems: See HPI,  otherwise negative ROS  Physical Exam: BP 96/70   Pulse 80   Temp (!) 96.4 F (35.8 C) (Tympanic)   Resp 18   Ht 5\' 3"  (1.6 m)   Wt 220 lb (99.8 kg)   SpO2 99%   BMI 38.97 kg/m  General:   Alert,  pleasant and cooperative in NAD Head:  Normocephalic and atraumatic. Neck:  Supple; no masses or thyromegaly. Lungs:  Clear throughout to auscultation.    Heart:  Regular rate and rhythm. Abdomen:  Soft, nontender and nondistended. Normal bowel sounds, without guarding, and without rebound.   Neurologic:  Alert and  oriented x4;  grossly normal neurologically.  Impression/Plan: Kara [name] is here for an  colonoscopy to be performed for colon cancer screening  Risks, benefits, limitations, and alternatives regarding  colonoscopy have been reviewed with the patient.  Questions have been answered.  All parties agreeable.   Sherri Sear, MD  02/03/2017, 10:30 AM

## 2017-02-03 NOTE — Transfer of Care (Signed)
Immediate Anesthesia Transfer of Care Note  Patient: Kara [name]  Procedure(s) Performed: Procedure(s): COLONOSCOPY WITH PROPOFOL (N/A)  Patient Location: PACU  Anesthesia Type:General  Level of Consciousness: awake and sedated  Airway & Oxygen Therapy: Patient Spontanous Breathing and Patient connected to nasal cannula oxygen  Post-op Assessment: Report given to RN and Post -op Vital signs reviewed and stable  Post vital signs: Reviewed and stable  Last Vitals:  Vitals:   02/03/17 0855  BP: 96/70  Pulse: 80  Resp: 18  Temp: (!) 35.8 C  SpO2: 99%    Last Pain:  Vitals:   02/03/17 0855  TempSrc: Tympanic         Complications: No apparent anesthesia complications

## 2017-02-04 ENCOUNTER — Encounter: Payer: Self-pay | Admitting: Gastroenterology

## 2017-02-04 NOTE — Telephone Encounter (Signed)
Error

## 2017-02-04 NOTE — Telephone Encounter (Signed)
Kara [name].Patient called stating that she was unable to have her colonoscopy done today due to Dr. Marius Ditch not being able to inserts the scope. She wanted to know when she would be able to see Dr. Adonis Huguenin to get this resolved. I told her that I would speak with him and find out how he wanted processed. Patient verbalized understanding.    Call made to patient at this time. I told her that Dr. Adonis Huguenin would like for her to come in on 9/19 to be seen at [facility]. I asked if she would be able to come in at that time. Patient verbalized understanding and the appointment was scheduled.

## 2017-02-09 ENCOUNTER — Encounter: Payer: Self-pay | Admitting: General Surgery

## 2017-02-09 ENCOUNTER — Telehealth: Payer: Self-pay

## 2017-02-09 ENCOUNTER — Ambulatory Visit (INDEPENDENT_AMBULATORY_CARE_PROVIDER_SITE_OTHER): Payer: PPO | Admitting: General Surgery

## 2017-02-09 VITALS — BP 139/82 | HR 80 | Temp 97.8°F | Ht 63.0 in | Wt 216.2 lb

## 2017-02-09 DIAGNOSIS — K5732 Diverticulitis of large intestine without perforation or abscess without bleeding: Secondary | ICD-10-CM

## 2017-02-09 NOTE — Progress Notes (Signed)
Outpatient Surgical Follow Up  02/09/2017  Kara [name] is an 66 y.o. female.   Chief Complaint  Patient presents with  . Follow-up    Discuss surgery    HPI: 66 year old female returns to clinic for follow-up for her end colostomy. She recently went for a colonoscopy and they were unable to get the scope past the ostomy site due to stenosis. Patient reports that she is otherwise doing very well. She is eating well and having normal ostomy output. She denies any fevers, chills, nausea, vomiting, chest pain, shortness of breath. She strongly desires to have her ostomy reversed as soon as possible.  Past Medical History:  Diagnosis Date  . Diabetes ([place])   . Diverticulitis   . Hypertension   . 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 CARPAL TUNNEL RELEASE Bilateral   . COLON RESECTION SIGMOID N/A 11/02/2016   Procedure: COLON RESECTION SIGMOID;  Surgeon: [name], MD;  Location: [name] ORS;  Service: General;  Laterality: N/A;  . COLONOSCOPY WITH PROPOFOL N/A 02/03/2017   Procedure: COLONOSCOPY WITH PROPOFOL;  Surgeon: Lin Landsman, MD;  Location: [name] ENDOSCOPY;  Service: Gastroenterology;  Laterality: N/A;  . COLOSTOMY N/A 11/02/2016   Procedure: COLOSTOMY;  Surgeon: [name], MD;  Location: [name] ORS;  Service: General;  Laterality: N/A;  . KNEE SURGERY     torn menicus  . PULMONARY VENOGRAPHY N/A 11/19/2016   Procedure: Pulmonary Venography; IVC filter placement; possible pulmonary thrombectomy;  Surgeon: Katha Cabal, MD;  Location: [place] CV LAB;  Service: Cardiovascular;  Laterality: N/A;  . SIGMOIDOSCOPY N/A 11/13/2016   Procedure: endoscopic  flexible SIGMOIDOSCOPY;  Surgeon: Florene Glen, MD;  Location: [name] ORS;  Service: General;  Laterality: N/A;    Family History  Problem Relation Age of Onset  . Diabetes Mother   . Heart disease Mother   . Cancer Mother   . Heart  disease Father   . Diabetes Sister   . Heart disease Sister     Social History:  reports that she has never smoked. She has never used smokeless tobacco. She reports that she does not drink alcohol or use drugs.  Allergies:  Allergies  Allergen Reactions  . Latex   . Morphine And Related Nausea And Vomiting    Medications reviewed.    ROS A multipoint review of systems was completed, all pertinent positives and negatives are documented in the history of present illness and remainder are negative   BP 139/82   Pulse 80   Temp 97.8 F (36.6 C) (Oral)   Ht 5\' 3"  (1.6 m)   Wt 98.1 kg (216 lb 3.2 oz)   BMI 38.30 kg/m   Physical Exam Gen: NAD Resp: CTA CV: RRR ABD: Soft, NT, ND, End colostomy that is functional    No results found for this or any previous visit (from the past 48 hour(s)). No results found.  Assessment/Plan:  1. Diverticulitis of colon 66 year old female with end colostomy. Unable to obtain colonoscopy. Discussed that we will now change to barium enema to evaluate the remainder of her colon. She will return after images to discuss colostomy reversal. - DG Colon W/Cm - Wo/W Kub; Future     [name], MD [facility] General Surgeon  02/09/2017,9:13 AM

## 2017-02-09 NOTE — Telephone Encounter (Signed)
Medical Clearance faxed to Dr.Hande at this time.

## 2017-02-09 NOTE — Patient Instructions (Signed)
We have scheduled you for a Barium Enema today. Your appointment has been scheduled for 02/11/17 at 10:15 am. You will need to arrive at 10:15 am  to register for this test.  Bring a list of medications with you to your appointment.  You will need to be on clear liquid diet for 24 hours prior to your appointment and may not have anything to eat or drink after midnight the day of your testing.  If you need to reschedule your Scan, you may do so by calling [phone].  We will call you with the results and let you know when we will add you to the surgery schedule.

## 2017-02-11 ENCOUNTER — Telehealth: Payer: Self-pay | Admitting: General Practice

## 2017-02-11 ENCOUNTER — Telehealth: Payer: Self-pay

## 2017-02-11 ENCOUNTER — Ambulatory Visit
Admission: RE | Admit: 2017-02-11 | Discharge: 2017-02-11 | Disposition: A | Discharge status: Home / Self Care | Payer: PPO | Source: Ambulatory Visit | Attending: General Surgery | Admitting: General Surgery

## 2017-02-11 DIAGNOSIS — K5730 Diverticulosis of large intestine without perforation or abscess without bleeding: Secondary | ICD-10-CM | POA: Diagnosis not present

## 2017-02-11 DIAGNOSIS — K5732 Diverticulitis of large intestine without perforation or abscess without bleeding: Secondary | ICD-10-CM | POA: Insufficient documentation

## 2017-02-11 NOTE — Telephone Encounter (Signed)
Medical Clearance obtain and approved. Copy can be found in patient's chart under Medica Tab.

## 2017-02-11 NOTE — Telephone Encounter (Signed)
Patient called and left a message @ 2:01 pm calling for her results on barium enema. Please call the patient with these results.

## 2017-02-14 ENCOUNTER — Telehealth: Payer: Self-pay

## 2017-02-14 ENCOUNTER — Ambulatory Visit (INDEPENDENT_AMBULATORY_CARE_PROVIDER_SITE_OTHER): Payer: PPO | Admitting: General Surgery

## 2017-02-14 ENCOUNTER — Telehealth: Payer: Self-pay | Admitting: General Practice

## 2017-02-14 ENCOUNTER — Encounter: Payer: Self-pay | Admitting: General Surgery

## 2017-02-14 VITALS — BP 132/75 | HR 94 | Temp 97.7°F | Ht 63.0 in | Wt 216.0 lb

## 2017-02-14 DIAGNOSIS — M5136 Other intervertebral disc degeneration, lumbar region: Secondary | ICD-10-CM | POA: Diagnosis not present

## 2017-02-14 DIAGNOSIS — Z933 Colostomy status: Secondary | ICD-10-CM

## 2017-02-14 DIAGNOSIS — Z86711 Personal history of pulmonary embolism: Secondary | ICD-10-CM | POA: Diagnosis not present

## 2017-02-14 DIAGNOSIS — E119 Type 2 diabetes mellitus without complications: Secondary | ICD-10-CM | POA: Diagnosis not present

## 2017-02-14 DIAGNOSIS — I10 Essential (primary) hypertension: Secondary | ICD-10-CM | POA: Diagnosis not present

## 2017-02-14 DIAGNOSIS — Z0000 Encounter for general adult medical examination without abnormal findings: Secondary | ICD-10-CM | POA: Diagnosis not present

## 2017-02-14 MED ORDER — ERYTHROMYCIN BASE 500 MG PO TABS: 1000.0000 mg | ORAL_TABLET | Freq: Three times a day (TID) | ORAL | 0 refills | Status: DC

## 2017-02-14 MED ORDER — BISACODYL 5 MG PO TBEC: 5.0000 mg | DELAYED_RELEASE_TABLET | Freq: Once | ORAL | 0 refills | Status: AC

## 2017-02-14 MED ORDER — POLYETHYLENE GLYCOL 3350 17 GM/SCOOP PO POWD: 1.0000 | Freq: Once | ORAL | 0 refills | Status: AC

## 2017-02-14 MED ORDER — NEOMYCIN SULFATE 500 MG PO TABS: 1000.0000 mg | ORAL_TABLET | Freq: Three times a day (TID) | ORAL | 0 refills | Status: DC

## 2017-02-14 NOTE — Telephone Encounter (Signed)
Patient called said she is taken zarelto instead of the eliquis. Please call patient and advice if you have any questions.

## 2017-02-14 NOTE — Telephone Encounter (Signed)
Patient notified of appointment with Dr.Schnier 02/21/17 @ 8:30 am.   Vascular Clearance faxed  To Dr.Schnier at this time.  Cardiology Clearance faxed to Dr.Fath at this time.    Medications for bowel prep sent to pharmacy at this time.

## 2017-02-14 NOTE — Patient Instructions (Signed)
We have spoken today about reversing your Ostomy. You are requesting to have this done.  We will arrange this to be done on 01/18/17 by Dr. [name] at [facility].   Plan on being in the hospital between 5-7 days after surgery. You will be started on a liquid diet and then advanced as tolerated prior to going home.  If you have any disability or FMLA paperwork that needs to be filled out for your employer, please bring this in prior to surgery and it will be filled out upon your discharge from the hospital. We can give you a note anticipating your surgery date. If your employer is in need of this, please let us know.  To prep for your surgery, You will need to complete a bowel prep, 2 antibiotics, and a fleets enema prior to surgery. Your antibiotics are Neomycin and Erythromycin and these will be taken at 8am, 2pm, 8pm on the day of your prep. (Please see the Bowel sheet provided)  Please see your [facility]) Pre-Care Sheet for more information. If you have any questions, please call our office and ask for a nurse.  End Colostomy Reversal An end colostomy reversal is surgery that reverses an end colostomy. The large intestine is disconnected from the opening in the abdomen (stoma). Then it is reconnected to the large intestine inside the body. A stoma and pouch are no longer needed. Bowel movements can resume through the rectum. LET [facility] CARE PROVIDER KNOW ABOUT:  Allergies to food or medicine.  Medicines taken, including vitamins, health supplements, herbs, eye drops, over-the-counter medicines, and creams.  Use of steroids (by mouth or creams).  Previous problems with anesthetics or numbing medicines.  History of bleeding problems or blood clots.  Previous surgery.  Other health problems, including diabetes and kidney problems.  Possibility of pregnancy, if this applies. RISKS AND COMPLICATIONS General surgical complications may include the following:  Reaction to  anesthetics.  Damage to surrounding nerves, tissues, or structures.  Blood clot.  Bleeding.  Scarring. Specific risks for colostomy reversal, while rare, may include:  Intestinal paralysis (ileus). This is a normal part of recovery. It usually goes away in 3-7 days. However, it can last longer in some people.  Leaking at the joined part of the intestine (anastomotic leak).  Infection of the surgical cut (incision) or the place where the stoma was located.  A collection of pus (abscess) in the abdomen or pelvis.  Intestinal blockage.  Narrowing at the joined part of the intestine (stricture).  Urinary and sexual dysfunction. BEFORE THE PROCEDURE It is important to follow your health care provider's instructions prior to your procedure. This will help you to avoid complications. Steps before your procedure may include:  A physical exam, rectal exam, X-rays, colonoscopy, and other procedures.  Chemotherapy or radiation therapy, if the stoma was created due to cancer.  A review of the procedure, the anesthetic being used, and what to expect after the procedure. You may be asked to:  Stop taking certain medicines for several days prior to your procedure. These may include blood thinners (such as aspirin).  Take certain medicines, such as antibiotics or stool softeners.  Avoid eating and drinking after midnight the night before the procedure. This will help you to avoid complications from the anesthetic.  Quit smoking. Smoking increases the chances of a healing problem after your procedure. PROCEDURE You will be given medicine that makes you sleep (general anesthetic). The procedure may be done as open surgery, with  a large incision. It may also be done as laparoscopic surgery, with several smaller incisions. The surgeon will stitch or staple the intestine ends back together. This surgery takes several hours. AFTER THE PROCEDURE  You will be given pain medicine.  Slowly  increase your diet and movement as directed by your health care provider.  You should arrange for someone to help you with activities at home while you recover.   This information is not intended to replace advice given to you by your health care provider. Make sure you discuss any questions you have with your health care provider.   Document Released: 08/02/2011 Document Revised: 09/24/2014 Document Reviewed: 08/02/2011 Elsevier Interactive Patient Education [employer].

## 2017-02-14 NOTE — Progress Notes (Signed)
Outpatient Surgical Follow Up  02/14/2017  Acquanetta Belling Doaa Kendzierski is an 66 y.o. female.   Chief Complaint  Patient presents with  . Follow-up    Discuss Colostomy reversal-    HPI: 66 year old female returns to clinic for follow-up from recent barium study in workup for reversal of her colostomy. Patient reports she tolerated the procedure well. She continues to have a good appetite and appropriate ostomy output. She denies any fevers, chills, nausea, vomiting, chest pain, shortness of breath. She states that she self discontinued her aspirin and her anticoagulant in anticipation of her ostomy being reversed. She has medical clearance for no anticoagulation clearance currently.  Past Medical History:  Diagnosis Date  . Diabetes ([place])   . Diverticulitis   . Hypertension   . 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[facility])     Past Surgical History:  Procedure Laterality Date  . CARPAL TUNNEL RELEASE Bilateral   . COLON RESECTION SIGMOID N/A 11/02/2016   Procedure: COLON RESECTION SIGMOID;  Surgeon: [name], MD;  Location: [name] ORS;  Service: General;  Laterality: N/A;  . COLONOSCOPY WITH PROPOFOL N/A 02/03/2017   Procedure: COLONOSCOPY WITH PROPOFOL;  Surgeon: Lin Landsman, MD;  Location: [name] ENDOSCOPY;  Service: Gastroenterology;  Laterality: N/A;  . COLOSTOMY N/A 11/02/2016   Procedure: COLOSTOMY;  Surgeon: [name], MD;  Location: [name] ORS;  Service: General;  Laterality: N/A;  . KNEE SURGERY     torn menicus  . PULMONARY VENOGRAPHY N/A 11/19/2016   Procedure: Pulmonary Venography; IVC filter placement; possible pulmonary thrombectomy;  Surgeon: Katha Cabal, MD;  Location: [place] CV LAB;  Service: Cardiovascular;  Laterality: N/A;  . SIGMOIDOSCOPY N/A 11/13/2016   Procedure: endoscopic  flexible SIGMOIDOSCOPY;  Surgeon: Florene Glen, MD;  Location: [name] ORS;  Service: General;  Laterality: N/A;    Family History  Problem Relation Age of Onset  .  Diabetes Mother   . Heart disease Mother   . Cancer Mother   . Heart disease Father   . Diabetes Sister   . Heart disease Sister     Social History:  reports that she has never smoked. She has never used smokeless tobacco. She reports that she does not drink alcohol or use drugs.  Allergies:  Allergies  Allergen Reactions  . Latex   . Morphine And Related Nausea And Vomiting    Medications reviewed.    ROS A multipoint review of systems was completed, all pertinent positives and negatives are documented within the history of present illness and the remainder are negative   BP 132/75   Pulse 94   Temp 97.7 F (36.5 C) (Oral)   Ht 5\' 3"  (1.6 m)   Wt 98 kg (216 lb)   BMI 38.26 kg/m   Physical Exam Gen.: No acute distress  neck: Supple and nontender Chest: Clear to auscultation  heart: Regular rate and rhythm Abdomen: Large, soft, nontender. End colostomy in the left lower quadrant is patent and productive of gas and stool. Extremities: No evidence of edema, moves all cavities well.   No results found for this or any previous visit (from the past 48 hour(s)). No results found.  Assessment/Plan:  1. Colostomy in place [facility]) 66 year old female with an end colostomy secondary to sigmoid diverticulitis. Discussed the process of the colostomy reversal in detail. Described the procedure for laparoscopic versus open colostomy takedown and reversal. This included the risks, benefits, alternatives. The primary risks being pain, bleeding, infection, need to convert to an open  procedure, anastomotic leak, need for ileostomy. Primary benefit being removal of ostomy. Patient voiced understanding and desires to proceed. We will seek clearance for anticoagulation and from vascular surgery due to her IVC filter secondary to pulmonary embolisms over the summer. Tentatively plan for surgery on Thursday, October 4 to be assisted by my partner Dr. Burt Knack.  A total of 25 minutes was used on  this encounter with greater than 50% of the time used for counseling and coordination of care   [name], MD [facility] General Surgeon  02/14/2017,3:02 PM

## 2017-02-14 NOTE — Telephone Encounter (Signed)
Called patient she's coming in at 45

## 2017-02-14 NOTE — Telephone Encounter (Signed)
Left message asking patient to return call to office.  Per Dr.Woodham - add patient to schedule today to discuss surgery. We have results of the Barium enema.

## 2017-02-14 NOTE — Consult Note (Signed)
Reason for Consult:shortness of breath congestive heart failure Referring Physician: Dr. Arbutus Leas Kara [name] is an 66 y.o. female.  HPI: Patient's a 66 year old female apparently underwent colectomy recently presents with dyspnea shortness of breath post procedure. Patient also was found to have diverticulitis and perforation was the reason for colectomy. Patient complains of peripheral leg swelling over the past few days was dyspneic came to emergency room found to have hypoxemia treated with IV LASIX WHICH SEEMED TO IMPROVE HIS SYMPTOMS> CARDIOLOGY CONSULTATION WAS THEN RECOMMENDED FOR FURTHER ASSESSMENT.  Past Medical History:  Diagnosis Date  . Diabetes ([place])   . Diverticulitis   . Hypertension   . 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 CARPAL TUNNEL RELEASE Bilateral   . COLON RESECTION SIGMOID N/A 11/02/2016   Procedure: COLON RESECTION SIGMOID;  Surgeon: [name], MD;  Location: [name] ORS;  Service: General;  Laterality: N/A;  . COLONOSCOPY WITH PROPOFOL N/A 02/03/2017   Procedure: COLONOSCOPY WITH PROPOFOL;  Surgeon: Lin Landsman, MD;  Location: [name] ENDOSCOPY;  Service: Gastroenterology;  Laterality: N/A;  . COLOSTOMY N/A 11/02/2016   Procedure: COLOSTOMY;  Surgeon: [name], MD;  Location: [name] ORS;  Service: General;  Laterality: N/A;  . KNEE SURGERY     torn menicus  . PULMONARY VENOGRAPHY N/A 11/19/2016   Procedure: Pulmonary Venography; IVC filter placement; possible pulmonary thrombectomy;  Surgeon: Katha Cabal, MD;  Location: [place] CV LAB;  Service: Cardiovascular;  Laterality: N/A;  . SIGMOIDOSCOPY N/A 11/13/2016   Procedure: endoscopic  flexible SIGMOIDOSCOPY;  Surgeon: Florene Glen, MD;  Location: [name] ORS;  Service: General;  Laterality: N/A;    Family History  Problem Relation Age of Onset  . Diabetes Mother   . Heart disease Mother   . Cancer Mother    . Heart disease Father   . Diabetes Sister   . Heart disease Sister     Social History:  reports that she has never smoked. She has never used smokeless tobacco. She reports that she does not drink alcohol or use drugs.  Allergies:  Allergies  Allergen Reactions  . Latex   . Morphine And Related Nausea And Vomiting    Medications: I have reviewed the patient's current medications.  No results found for this or any previous visit (from the past 48 hour(s)).  No results found.  Review of Systems  Constitutional: Positive for malaise/fatigue.  HENT: Positive for sinus pain.   Eyes: Negative.   Respiratory: Positive for shortness of breath.   Cardiovascular: Positive for chest pain, orthopnea and leg swelling.  Gastrointestinal: Negative.   Genitourinary: Negative.   Musculoskeletal: Negative.   Skin: Negative.   Neurological: Positive for weakness.  Endo/Heme/Allergies: Negative.   Psychiatric/Behavioral: Negative.    Blood pressure (!) 120/54, pulse 98, temperature 98.2 F (36.8 C), temperature source Oral, resp. rate 16, height 5\' 3"  (1.6 m), weight 221 lb 8 oz (100.5 kg), SpO2 91 %. Physical Exam  Nursing note and vitals reviewed. Constitutional: She is oriented to person, place, and time. She appears well-developed and well-nourished.  HENT:  Head: Normocephalic and atraumatic.  Eyes: Pupils are equal, round, and reactive to light. Conjunctivae and EOM are normal.  Neck: Normal range of motion. Neck supple.  Cardiovascular: Normal rate, regular rhythm and normal heart sounds.   Respiratory: Effort normal and breath sounds normal.  GI: Soft. Bowel sounds are normal.  Musculoskeletal: Normal range of  motion.  Neurological: She is alert and oriented to person, place, and time. She has normal reflexes.  Skin: Skin is warm and dry.  Psychiatric: She has a normal mood and affect.    Assessment/Plan: Shortness of breath Congestive heart  failure Edema Pneumonia UTI Diabetes Hypertension Diverticulitis Postop colectomy . Plan Agree with treatment post colectomy Congestive heart failure shortness of breath also related to pneumonia continue diuretic therapy for mild heart failure Supplemental oxygen as necessary Agree with rule out for myocardial infarction with troponins Have patient follow-up with cardiology  Margi Edmundson D Kolbie Lepkowski 02/14/2017, 1:21 PM

## 2017-02-15 ENCOUNTER — Telehealth: Payer: Self-pay

## 2017-02-15 ENCOUNTER — Telehealth: Payer: Self-pay | Admitting: General Surgery

## 2017-02-15 NOTE — Telephone Encounter (Signed)
Pt advised of pre op date/time and sx date. Sx: 02/24/17 with Dr Adonis Huguenin, Dr Burt Knack assisting--Colostomy reversal.  Pre op: 02/17/17 @ 10:45am--Office.   Patient made aware to call [phone], between 1-3:00pm the day before surgery, to find out what time to arrive.

## 2017-02-15 NOTE — Telephone Encounter (Signed)
Vascular surgery obtained to proceed with surgery at this time and will be scanned under media.

## 2017-02-16 ENCOUNTER — Telehealth: Payer: Self-pay

## 2017-02-16 NOTE — Telephone Encounter (Signed)
Cardiac Clearance obtained from Dr.Fath at this time and scanned under Media.

## 2017-02-17 ENCOUNTER — Telehealth: Payer: Self-pay

## 2017-02-17 ENCOUNTER — Encounter
Admission: RE | Admit: 2017-02-17 | Discharge: 2017-02-17 | Disposition: A | Discharge status: Home / Self Care | Payer: PPO | Source: Ambulatory Visit | Attending: General Surgery | Admitting: General Surgery

## 2017-02-17 DIAGNOSIS — Z01812 Encounter for preprocedural laboratory examination: Secondary | ICD-10-CM | POA: Insufficient documentation

## 2017-02-17 DIAGNOSIS — R9431 Abnormal electrocardiogram [ECG] [EKG]: Secondary | ICD-10-CM | POA: Insufficient documentation

## 2017-02-17 DIAGNOSIS — Z01810 Encounter for preprocedural cardiovascular examination: Secondary | ICD-10-CM | POA: Diagnosis not present

## 2017-02-17 DIAGNOSIS — E119 Type 2 diabetes mellitus without complications: Secondary | ICD-10-CM | POA: Diagnosis not present

## 2017-02-17 DIAGNOSIS — I10 Essential (primary) hypertension: Secondary | ICD-10-CM | POA: Insufficient documentation

## 2017-02-17 HISTORY — DX: Unspecified osteoarthritis, unspecified site: M19.90

## 2017-02-17 LAB — SURGICAL PCR SCREEN
MRSA, PCR: POSITIVE — AB
Staphylococcus aureus: POSITIVE — AB

## 2017-02-17 LAB — BASIC METABOLIC PANEL
Anion gap: 7 (ref 5–15)
BUN: 18 mg/dL (ref 6–20)
CHLORIDE: 107 mmol/L (ref 101–111)
CO2: 27 mmol/L (ref 22–32)
Calcium: 9.2 mg/dL (ref 8.9–10.3)
Creatinine, Ser: 0.7 mg/dL (ref 0.44–1.00)
GFR calc Af Amer: >60 mL/min (ref >60)
GFR calc non Af Amer: >60 mL/min (ref >60)
Glucose, Bld: 163 mg/dL — ABNORMAL HIGH (ref 65–99)
POTASSIUM: 3.7 mmol/L (ref 3.5–5.1)
SODIUM: 141 mmol/L (ref 135–145)

## 2017-02-17 LAB — HEMOGLOBIN A1C
Hgb A1c MFr Bld: 7.2 % — ABNORMAL HIGH (ref 4.8–5.6)
Mean Plasma Glucose: 159.94 mg/dL

## 2017-02-17 NOTE — Telephone Encounter (Signed)
Vascular clearance obtain and approved. Clearance scanned into patients chart under Media Tab.

## 2017-02-17 NOTE — Pre-Procedure Instructions (Signed)
Vascular and cardiac clearances are in the front of pt chart.

## 2017-02-17 NOTE — Patient Instructions (Signed)
  Your procedure is scheduled FB:[id number] Oct. 4 , 2018. Report to Same Day Surgery. To find out your arrival time please call [phone] between 1PM - 3PM on Wed. Oct. 3, 2018.  Remember: Instructions that are not followed completely may result in serious medical risk, up to and including death, or upon the discretion of your surgeon and anesthesiologist your surgery may need to be rescheduled.    _x___ 1. Do not eat food after midnight night prior to surgery. No gum chewing or hard candies.    May drink the following: water up until 2 hours prior to arrival time.     ____ 2. No Alcohol for 24 hours before or after surgery.   ____ 3. Bring all medications with you on the day of surgery if instructed.    __x__ 4. Notify your doctor if there is any change in your medical condition     (cold, fever, infections).    _____ 5. No smoking 24 hours prior to surgery.     Do not wear jewelry, make-up, hairpins, clips or nail polish.  Do not wear lotions, powders, or perfumes.   Do not shave 48 hours prior to surgery. Men may shave face and neck.  Do not bring valuables to the hospital.    [facility] is not responsible for any belongings or valuables.               Contacts, dentures or bridgework may not be worn into surgery.  Leave your suitcase in the car. After surgery it may be brought to your room.  For patients admitted to the hospital, discharge time is determined by your treatment team.   Patients discharged the day of surgery will not be allowed to drive home.    Please read over the following fact sheets that you were given:   [facility] Preparing for Surgery  ____ Take these medicines the morning of surgery with A SIP OF WATER: None     ____ Fleet Enema (as directed)   __x__ Use CHG Soap as directed on instruction sheet  ____ Use inhalers on the day of surgery and bring to hospital day of surgery  ____ Stop metformin 2 days prior to surgery    ____ Take 1/2 of  usual insulin dose the night before surgery and none on the morning of surgery.   _x_ Stop Xarelto and aspirin today per surgeon's instructions  __x__ Stop Anti-inflammatories such as Advil, Aleve,diclofenac (VOLTAREN)  Ibuprofen, Motrin, Naproxen, Naprosyn,  Goodies powders or aspirin products. OK to take Tylenol.   ____ Stop supplements until after surgery.    ____ Bring C-Pap to the hospital.

## 2017-02-17 NOTE — Telephone Encounter (Signed)
Patient called wanting to know how and when she should start taking her antibiotics. I explained it to the patient and she understood and had no further questions.

## 2017-02-17 NOTE — Telephone Encounter (Signed)
Cardiac clearance obtain and approved. Clearance scanned into patients chart under Med[facility] Tab.

## 2017-02-18 ENCOUNTER — Telehealth: Payer: Self-pay

## 2017-02-18 ENCOUNTER — Telehealth (INDEPENDENT_AMBULATORY_CARE_PROVIDER_SITE_OTHER): Payer: Self-pay

## 2017-02-18 MED ORDER — MUPIROCIN CALCIUM 2 % EX CREA: TOPICAL_CREAM | CUTANEOUS | 0 refills | Status: DC

## 2017-02-18 NOTE — Telephone Encounter (Signed)
Patient called wanting to know how she was going to be seen for vascular clearance the same day of her surgery. I told the patient that we have already received vascular clearance from Dr. Delana Meyer. I told her that I would speak with Freda Munro our nurse here who sent over the clearance and find out if she needs to go reschedule the appointment for sooner, not attend the appointment due to already having vascular clearance. I advised patient that I will find out what she needed to do and give her a call back.   Call made to Dr. Nino Parsley office to find out if the vascular clearance that we received was accurate. I spoke with April a nurse at his office. I told her the situation of receiving the clearance although the patient has not been seen yet in their office. She stated that she put the appointment in as stat since the patient is a new patient to their office. I was placed on hold for her to find out if the clearance was accurate or if the patient needed to come in. April stated that she would have to speak with Dr. Delana Meyer and that he will not be back in until Monday. I told her that I will advise the patient of the phone call I'm awaiting. I also told April that she can speak with any nurse here in the office once she gets a final answer. She verbalized understanding.   Call made to patient at this time and I informed her that I will be awaiting a phone call from Dr. Loistine Chance office on Monday seeing that is when he will be back in. I told her that I would try to see if I could get her an earlier appointment just in case she does however need to be seen. She verbalized understanding and stated that she would be awaiting my call.   Call made to Dr. Nino Parsley office and I spoke with front desk scheduler and she informed me that the appointment that Mrs. Jenesa Foresta currently has is the earliest that she would be able to be seen due to her being a new patient and Dr. Delana Meyer already being booked on  Monday. She did offer to put patient on a cancellation list and I told her that I would wait until I heard back from April, Dr. Delana Meyer nurse.  Call made to patient at this time and informed her that her appointment is scheduled for 10/4 at 8:30 and that I could not get her in any sooner due to Dr. Delana Meyer not being available. I told her to wait for a call from me on Monday to ensure that the vascular clearance that was sent was accurate. Patient verbalized understanding.

## 2017-02-18 NOTE — Telephone Encounter (Signed)
Patient notified of positive MRSA results and instructed to pick up Bactroban at Pharmacy and begin using 02/20/17. Patient verbalized understanding.

## 2017-02-18 NOTE — Telephone Encounter (Signed)
I spoke with one of the nurses from [facility] and they ask if the patient should keep her appointment on 02/24/17 with our office but the patient also have an procedure schedule the same day with there office.The nurse inform they already got the clearance from our office for her to have the procedure.I inform her that I will call on Monday and speak to GS to see if the pt should keep the appt for [facility]

## 2017-02-21 ENCOUNTER — Telehealth: Payer: Self-pay

## 2017-02-21 ENCOUNTER — Telehealth: Payer: Self-pay | Admitting: General Practice

## 2017-02-21 MED ORDER — MUPIROCIN CALCIUM 2 % NA OINT: 1.0000 "application " | TOPICAL_OINTMENT | Freq: Two times a day (BID) | NASAL | 0 refills | Status: DC

## 2017-02-21 NOTE — Telephone Encounter (Signed)
Call made to [facility] at this time. I asked to get patients Bactroban cream changed to Bactroban ointment. Pharmacist advised and was able to make that change for me.  Call made to patient at this time to let her know that the new medication has been called into the [facility] on [address]. Patient verbalized understanding at this time and advised that she would pick it up today.

## 2017-02-21 NOTE — Telephone Encounter (Signed)
Spoke with [name] at this time and informed her of not having to go to the appointment with Dr. Delana Meyer. She verbalized understanding and wanted to know about an ointment that she was told about from a nurse at the hospital. I told her that we will find out what the ointment is and have it called in for her and give her a call when it is ready.

## 2017-02-21 NOTE — Telephone Encounter (Signed)
Called patient but had to leave her a voicemail to let her know that her prescription (ointment) was sent to her pharmacy.

## 2017-02-21 NOTE — Telephone Encounter (Signed)
Patient is calling we sent in an rx for cream for her nose, patient said it was $85.00 dollars and she said the pharmacy told the patient to ask for the ointment. Please call patient and advice.

## 2017-02-23 ENCOUNTER — Ambulatory Visit: Payer: PPO | Admitting: General Surgery

## 2017-02-23 MED ORDER — CEFOTETAN DISODIUM 2 G IJ SOLR
2.0000 g | INTRAMUSCULAR | Status: AC
  Administered 2017-02-24: 2 g via INTRAVENOUS
  Filled 2017-02-23 (×2): qty 2

## 2017-02-24 ENCOUNTER — Inpatient Hospital Stay: Payer: PPO | Admitting: Anesthesiology

## 2017-02-24 ENCOUNTER — Encounter
Admission: RE | Disposition: A | Discharge status: Home / Self Care | Payer: Self-pay | Source: Ambulatory Visit | Attending: General Surgery

## 2017-02-24 ENCOUNTER — Encounter (INDEPENDENT_AMBULATORY_CARE_PROVIDER_SITE_OTHER): Payer: Self-pay | Admitting: Vascular Surgery

## 2017-02-24 ENCOUNTER — Inpatient Hospital Stay
Admission: RE | Admit: 2017-02-24 | Discharge: 2017-03-02 | DRG: 331 | Disposition: A | Discharge status: Home / Self Care | Payer: PPO | Source: Ambulatory Visit | Attending: General Surgery | Admitting: General Surgery

## 2017-02-24 DIAGNOSIS — I10 Essential (primary) hypertension: Secondary | ICD-10-CM | POA: Diagnosis not present

## 2017-02-24 DIAGNOSIS — Z9889 Other specified postprocedural states: Secondary | ICD-10-CM | POA: Diagnosis present

## 2017-02-24 DIAGNOSIS — R079 Chest pain, unspecified: Secondary | ICD-10-CM | POA: Diagnosis not present

## 2017-02-24 DIAGNOSIS — K5792 Diverticulitis of intestine, part unspecified, without perforation or abscess without bleeding: Secondary | ICD-10-CM | POA: Diagnosis not present

## 2017-02-24 DIAGNOSIS — K5732 Diverticulitis of large intestine without perforation or abscess without bleeding: Secondary | ICD-10-CM | POA: Diagnosis not present

## 2017-02-24 DIAGNOSIS — Z433 Encounter for attention to colostomy: Secondary | ICD-10-CM | POA: Diagnosis not present

## 2017-02-24 DIAGNOSIS — Z8249 Family history of ischemic heart disease and other diseases of the circulatory system: Secondary | ICD-10-CM

## 2017-02-24 DIAGNOSIS — I509 Heart failure, unspecified: Secondary | ICD-10-CM | POA: Diagnosis not present

## 2017-02-24 DIAGNOSIS — K660 Peritoneal adhesions (postprocedural) (postinfection): Secondary | ICD-10-CM | POA: Diagnosis not present

## 2017-02-24 DIAGNOSIS — J90 Pleural effusion, not elsewhere classified: Secondary | ICD-10-CM | POA: Diagnosis not present

## 2017-02-24 DIAGNOSIS — Z91040 Latex allergy status: Secondary | ICD-10-CM | POA: Diagnosis not present

## 2017-02-24 DIAGNOSIS — Z809 Family history of malignant neoplasm, unspecified: Secondary | ICD-10-CM

## 2017-02-24 DIAGNOSIS — Z86718 Personal history of other venous thrombosis and embolism: Secondary | ICD-10-CM

## 2017-02-24 DIAGNOSIS — E119 Type 2 diabetes mellitus without complications: Secondary | ICD-10-CM | POA: Diagnosis present

## 2017-02-24 DIAGNOSIS — Z833 Family history of diabetes mellitus: Secondary | ICD-10-CM

## 2017-02-24 DIAGNOSIS — Z885 Allergy status to narcotic agent status: Secondary | ICD-10-CM | POA: Diagnosis not present

## 2017-02-24 DIAGNOSIS — I110 Hypertensive heart disease with heart failure: Secondary | ICD-10-CM | POA: Diagnosis not present

## 2017-02-24 DIAGNOSIS — Z23 Encounter for immunization: Secondary | ICD-10-CM

## 2017-02-24 DIAGNOSIS — R0789 Other chest pain: Secondary | ICD-10-CM

## 2017-02-24 HISTORY — PX: ILEO LOOP DIVERSION: SHX1780

## 2017-02-24 HISTORY — PX: APPENDECTOMY: SHX54

## 2017-02-24 HISTORY — PX: COLOSTOMY REVERSAL: SHX5782

## 2017-02-24 HISTORY — PX: LYSIS OF ADHESION: SHX5961

## 2017-02-24 HISTORY — PX: LAPAROSCOPY: SHX197

## 2017-02-24 LAB — CBC
HEMATOCRIT: 42.7 % (ref 35.0–47.0)
HEMOGLOBIN: 14.2 g/dL (ref 12.0–16.0)
MCH: 29.6 pg (ref 26.0–34.0)
MCHC: 33.3 g/dL (ref 32.0–36.0)
MCV: 89 fL (ref 80.0–100.0)
Platelets: 210 10*3/uL (ref 150–440)
RBC: 4.8 MIL/uL (ref 3.80–5.20)
RDW: 13.3 % (ref 11.5–14.5)
WBC: 15.6 10*3/uL — ABNORMAL HIGH (ref 3.6–11.0)

## 2017-02-24 LAB — BASIC METABOLIC PANEL
Anion gap: 10 (ref 5–15)
BUN: 15 mg/dL (ref 6–20)
CHLORIDE: 110 mmol/L (ref 101–111)
CO2: 21 mmol/L — AB (ref 22–32)
Calcium: 8.2 mg/dL — ABNORMAL LOW (ref 8.9–10.3)
Creatinine, Ser: 1.17 mg/dL — ABNORMAL HIGH (ref 0.44–1.00)
GFR calc Af Amer: 55 mL/min — ABNORMAL LOW (ref >60)
GFR, EST NON AFRICAN AMERICAN: 48 mL/min — AB (ref >60)
GLUCOSE: 286 mg/dL — AB (ref 65–99)
POTASSIUM: 4.1 mmol/L (ref 3.5–5.1)
Sodium: 141 mmol/L (ref 135–145)

## 2017-02-24 LAB — GLUCOSE, CAPILLARY
GLUCOSE-CAPILLARY: 150 mg/dL — AB (ref 65–99)
GLUCOSE-CAPILLARY: 247 mg/dL — AB (ref 65–99)
Glucose-Capillary: 256 mg/dL — ABNORMAL HIGH (ref 65–99)

## 2017-02-24 SURGERY — COLOSTOMY REVERSAL
Anesthesia: General | Site: Abdomen | Wound class: Clean Contaminated

## 2017-02-24 MED ORDER — DIPHENHYDRAMINE HCL 50 MG/ML IJ SOLN
12.5000 mg | Freq: Four times a day (QID) | INTRAMUSCULAR | Status: DC | PRN
  Administered 2017-02-26: 12.5 mg via INTRAVENOUS
  Filled 2017-02-24: qty 1

## 2017-02-24 MED ORDER — DIPHENHYDRAMINE HCL 12.5 MG/5ML PO ELIX: 12.5000 mg | ORAL_SOLUTION | Freq: Four times a day (QID) | ORAL | Status: DC | PRN

## 2017-02-24 MED ORDER — LIDOCAINE HCL (PF) 1 % IJ SOLN
INTRAMUSCULAR | Status: DC | PRN
  Administered 2017-02-24: 8 mL via SUBCUTANEOUS

## 2017-02-24 MED ORDER — DIPHENHYDRAMINE HCL 50 MG/ML IJ SOLN: 12.5000 mg | Freq: Four times a day (QID) | INTRAMUSCULAR | Status: DC | PRN

## 2017-02-24 MED ORDER — SUCCINYLCHOLINE CHLORIDE 20 MG/ML IJ SOLN
INTRAMUSCULAR | Status: DC | PRN
  Administered 2017-02-24: 100 mg via INTRAVENOUS

## 2017-02-24 MED ORDER — ONDANSETRON HCL 4 MG/2ML IJ SOLN: 4.0000 mg | Freq: Once | INTRAMUSCULAR | Status: DC | PRN

## 2017-02-24 MED ORDER — ROCURONIUM BROMIDE 100 MG/10ML IV SOLN
INTRAVENOUS | Status: DC | PRN
  Administered 2017-02-24: 40 mg via INTRAVENOUS
  Administered 2017-02-24: 10 mg via INTRAVENOUS
  Administered 2017-02-24 (×3): 20 mg via INTRAVENOUS
  Administered 2017-02-24: 10 mg via INTRAVENOUS

## 2017-02-24 MED ORDER — INSULIN ASPART 100 UNIT/ML ~~LOC~~ SOLN
SUBCUTANEOUS | Status: AC
Start: 2017-02-24 — End: 2017-02-25
  Filled 2017-02-24: qty 1

## 2017-02-24 MED ORDER — FAMOTIDINE 20 MG PO TABS
20.0000 mg | ORAL_TABLET | Freq: Once | ORAL | Status: AC
  Administered 2017-02-24: 20 mg via ORAL

## 2017-02-24 MED ORDER — ACETAMINOPHEN 10 MG/ML IV SOLN
INTRAVENOUS | Status: AC
  Filled 2017-02-24: qty 100

## 2017-02-24 MED ORDER — FENTANYL CITRATE (PF) 100 MCG/2ML IJ SOLN
INTRAMUSCULAR | Status: DC | PRN
  Administered 2017-02-24 (×4): 50 ug via INTRAVENOUS
  Administered 2017-02-24: 100 ug via INTRAVENOUS

## 2017-02-24 MED ORDER — ACETAMINOPHEN 10 MG/ML IV SOLN
INTRAVENOUS | Status: DC | PRN
  Administered 2017-02-24: 1000 mg via INTRAVENOUS

## 2017-02-24 MED ORDER — KETOROLAC TROMETHAMINE 30 MG/ML IJ SOLN
15.0000 mg | Freq: Four times a day (QID) | INTRAMUSCULAR | Status: AC
  Administered 2017-02-24: 15 mg via INTRAVENOUS
  Filled 2017-02-24: qty 1

## 2017-02-24 MED ORDER — DEXAMETHASONE SODIUM PHOSPHATE 10 MG/ML IJ SOLN
INTRAMUSCULAR | Status: AC
Start: 2017-02-24 — End: 2017-02-24
  Filled 2017-02-24: qty 1

## 2017-02-24 MED ORDER — KETOROLAC TROMETHAMINE 30 MG/ML IJ SOLN
INTRAMUSCULAR | Status: AC
  Filled 2017-02-24: qty 1

## 2017-02-24 MED ORDER — DEXAMETHASONE SODIUM PHOSPHATE 10 MG/ML IJ SOLN
INTRAMUSCULAR | Status: DC | PRN
  Administered 2017-02-24: 10 mg via INTRAVENOUS

## 2017-02-24 MED ORDER — CHLORHEXIDINE GLUCONATE CLOTH 2 % EX PADS: 6.0000 | MEDICATED_PAD | Freq: Once | CUTANEOUS | Status: DC

## 2017-02-24 MED ORDER — INSULIN ASPART 100 UNIT/ML ~~LOC~~ SOLN
5.0000 [IU] | Freq: Once | SUBCUTANEOUS | Status: AC
  Administered 2017-02-24: 5 [IU] via SUBCUTANEOUS

## 2017-02-24 MED ORDER — DIPHENHYDRAMINE HCL 12.5 MG/5ML PO ELIX
12.5000 mg | ORAL_SOLUTION | Freq: Four times a day (QID) | ORAL | Status: DC | PRN
  Filled 2017-02-24: qty 5

## 2017-02-24 MED ORDER — LIDOCAINE HCL (PF) 1 % IJ SOLN
INTRAMUSCULAR | Status: AC
  Filled 2017-02-24: qty 30

## 2017-02-24 MED ORDER — FENTANYL CITRATE (PF) 100 MCG/2ML IJ SOLN
INTRAMUSCULAR | Status: AC
  Filled 2017-02-24: qty 2

## 2017-02-24 MED ORDER — FENTANYL CITRATE (PF) 100 MCG/2ML IJ SOLN
INTRAMUSCULAR | Status: AC
  Administered 2017-02-24: 25 ug via INTRAVENOUS
  Filled 2017-02-24: qty 2

## 2017-02-24 MED ORDER — FENTANYL 40 MCG/ML IV SOLN
INTRAVENOUS | Status: DC
  Administered 2017-02-24: 21:00:00 via INTRAVENOUS
  Administered 2017-02-25: 180 ug via INTRAVENOUS
  Administered 2017-02-25: 90 ug via INTRAVENOUS
  Administered 2017-02-25: 111 ug via INTRAVENOUS
  Administered 2017-02-25: 50 ug via INTRAVENOUS
  Administered 2017-02-26: 10 ug via INTRAVENOUS
  Administered 2017-02-26: 0 ug via INTRAVENOUS
  Administered 2017-02-26: 10 ug via INTRAVENOUS
  Administered 2017-02-26: 40 ug via INTRAVENOUS
  Administered 2017-02-26: 60 ug via INTRAVENOUS
  Administered 2017-02-26: 0 ug via INTRAVENOUS
  Administered 2017-02-27: 70 ug via INTRAVENOUS
  Administered 2017-02-27: 60 ug via INTRAVENOUS
  Administered 2017-02-27: 50 ug via INTRAVENOUS
  Administered 2017-02-28 (×2): 20 ug via INTRAVENOUS
  Filled 2017-02-24 (×2): qty 25

## 2017-02-24 MED ORDER — SODIUM CHLORIDE 0.9 % IV SOLN
INTRAVENOUS | Status: DC
  Administered 2017-02-24 (×2): via INTRAVENOUS

## 2017-02-24 MED ORDER — ESMOLOL HCL 100 MG/10ML IV SOLN
INTRAVENOUS | Status: DC | PRN
  Administered 2017-02-24: 20 mg via INTRAVENOUS
  Administered 2017-02-24: 30 mg via INTRAVENOUS

## 2017-02-24 MED ORDER — INSULIN ASPART 100 UNIT/ML ~~LOC~~ SOLN
0.0000 [IU] | SUBCUTANEOUS | Status: DC
  Administered 2017-02-24: 7 [IU] via SUBCUTANEOUS
  Administered 2017-02-25: 4 [IU] via SUBCUTANEOUS
  Administered 2017-02-25 (×2): 7 [IU] via SUBCUTANEOUS
  Filled 2017-02-24 (×3): qty 1

## 2017-02-24 MED ORDER — ESMOLOL HCL 100 MG/10ML IV SOLN
INTRAVENOUS | Status: AC
  Filled 2017-02-24: qty 20

## 2017-02-24 MED ORDER — PIPERACILLIN-TAZOBACTAM 3.375 G IVPB
3.3750 g | Freq: Three times a day (TID) | INTRAVENOUS | Status: DC
  Administered 2017-02-24 – 2017-03-01 (×14): 3.375 g via INTRAVENOUS
  Filled 2017-02-24 (×14): qty 50

## 2017-02-24 MED ORDER — ONDANSETRON HCL 4 MG/2ML IJ SOLN
INTRAMUSCULAR | Status: AC
Start: 2017-02-24 — End: 2017-02-24
  Filled 2017-02-24: qty 2

## 2017-02-24 MED ORDER — LACTATED RINGERS IV SOLN
INTRAVENOUS | Status: DC
  Administered 2017-02-24 – 2017-02-28 (×9): via INTRAVENOUS

## 2017-02-24 MED ORDER — LACTATED RINGERS IV SOLN
INTRAVENOUS | Status: DC | PRN
  Administered 2017-02-24: 17:00:00 via INTRAVENOUS

## 2017-02-24 MED ORDER — NALOXONE HCL 0.4 MG/ML IJ SOLN: 0.4000 mg | INTRAMUSCULAR | Status: DC | PRN

## 2017-02-24 MED ORDER — FENTANYL CITRATE (PF) 100 MCG/2ML IJ SOLN: 25.0000 ug | INTRAMUSCULAR | Status: DC | PRN

## 2017-02-24 MED ORDER — FAMOTIDINE 20 MG PO TABS
ORAL_TABLET | ORAL | Status: AC
  Filled 2017-02-24: qty 1

## 2017-02-24 MED ORDER — PROPOFOL 10 MG/ML IV BOLUS
INTRAVENOUS | Status: DC | PRN
  Administered 2017-02-24: 150 mg via INTRAVENOUS

## 2017-02-24 MED ORDER — 0.9 % SODIUM CHLORIDE (POUR BTL) OPTIME
TOPICAL | Status: DC | PRN
  Administered 2017-02-24: 8000 mL

## 2017-02-24 MED ORDER — ONDANSETRON HCL 4 MG PO TABS: 4.0000 mg | ORAL_TABLET | Freq: Four times a day (QID) | ORAL | Status: DC | PRN

## 2017-02-24 MED ORDER — MIDAZOLAM HCL 2 MG/2ML IJ SOLN
INTRAMUSCULAR | Status: AC
  Filled 2017-02-24: qty 2

## 2017-02-24 MED ORDER — LIDOCAINE HCL (CARDIAC) 20 MG/ML IV SOLN
INTRAVENOUS | Status: DC | PRN
  Administered 2017-02-24: 100 mg via INTRAVENOUS

## 2017-02-24 MED ORDER — FENTANYL CITRATE (PF) 100 MCG/2ML IJ SOLN
25.0000 ug | INTRAMUSCULAR | Status: DC | PRN
  Administered 2017-02-24 (×4): 25 ug via INTRAVENOUS

## 2017-02-24 MED ORDER — SUGAMMADEX SODIUM 200 MG/2ML IV SOLN
INTRAVENOUS | Status: DC | PRN
  Administered 2017-02-24: 196 mg via INTRAVENOUS

## 2017-02-24 MED ORDER — ONDANSETRON HCL 4 MG/2ML IJ SOLN
INTRAMUSCULAR | Status: DC | PRN
  Administered 2017-02-24: 4 mg via INTRAVENOUS

## 2017-02-24 MED ORDER — ONDANSETRON HCL 4 MG/2ML IJ SOLN
4.0000 mg | Freq: Four times a day (QID) | INTRAMUSCULAR | Status: DC | PRN
  Administered 2017-02-25 – 2017-02-27 (×5): 4 mg via INTRAVENOUS
  Filled 2017-02-24 (×5): qty 2

## 2017-02-24 MED ORDER — LABETALOL HCL 5 MG/ML IV SOLN
INTRAVENOUS | Status: DC | PRN
  Administered 2017-02-24: 5 mg via INTRAVENOUS
  Administered 2017-02-24: 10 mg via INTRAVENOUS

## 2017-02-24 MED ORDER — SODIUM CHLORIDE 0.9% FLUSH: 9.0000 mL | INTRAVENOUS | Status: DC | PRN

## 2017-02-24 MED ORDER — MIDAZOLAM HCL 2 MG/2ML IJ SOLN
INTRAMUSCULAR | Status: DC | PRN
  Administered 2017-02-24: 2 mg via INTRAVENOUS

## 2017-02-24 MED ORDER — HYDRALAZINE HCL 20 MG/ML IJ SOLN: 10.0000 mg | INTRAMUSCULAR | Status: DC | PRN

## 2017-02-24 MED ORDER — ROCURONIUM BROMIDE 50 MG/5ML IV SOLN
INTRAVENOUS | Status: AC
  Filled 2017-02-24: qty 1

## 2017-02-24 MED ORDER — ONDANSETRON HCL 4 MG/2ML IJ SOLN: 4.0000 mg | Freq: Four times a day (QID) | INTRAMUSCULAR | Status: DC | PRN

## 2017-02-24 MED ORDER — ROCURONIUM BROMIDE 50 MG/5ML IV SOLN
INTRAVENOUS | Status: AC
  Filled 2017-02-24: qty 4

## 2017-02-24 MED ORDER — KETOROLAC TROMETHAMINE 30 MG/ML IJ SOLN
15.0000 mg | Freq: Four times a day (QID) | INTRAMUSCULAR | Status: DC | PRN
  Administered 2017-02-27 – 2017-02-28 (×5): 15 mg via INTRAVENOUS
  Filled 2017-02-24 (×6): qty 1

## 2017-02-24 MED ORDER — LACTATED RINGERS IV SOLN: INTRAVENOUS | Status: DC | PRN

## 2017-02-24 SURGICAL SUPPLY — 86 items
CANISTER SUCT 1200ML W/VALVE (MISCELLANEOUS) ×6 IMPLANT
CANISTER SUCT 3000ML PPV (MISCELLANEOUS) ×6 IMPLANT
CATH FOL LEG HOLDER (MISCELLANEOUS) ×3 IMPLANT
CATH PEDI SILICON 3C 10FR (CATHETERS) ×3 IMPLANT
CATH PEDI SILICON 3CC 8FR (CATHETERS) ×3 IMPLANT
DRAPE LAPAROTOMY 100X77 ABD (DRAPES) IMPLANT
DRAPE LEGGINS SURG 28X43 STRL (DRAPES) ×3 IMPLANT
DRAPE TABLE BACK 80X90 (DRAPES) ×3 IMPLANT
DRAPE UNDER BUTTOCK W/FLU (DRAPES) ×3 IMPLANT
DRSG OPSITE POSTOP 4X14 (GAUZE/BANDAGES/DRESSINGS) IMPLANT
DRSG OPSITE POSTOP 4X6 (GAUZE/BANDAGES/DRESSINGS) IMPLANT
ELECT BLADE 6.5 EXT (BLADE) ×3 IMPLANT
ELECT CAUTERY BLADE 6.4 (BLADE) ×3 IMPLANT
ELECT REM PT RETURN 9FT ADLT (ELECTROSURGICAL) ×3
ELECTRODE REM PT RTRN 9FT ADLT (ELECTROSURGICAL) ×1 IMPLANT
GAUZE SPONGE 4X4 12PLY STRL (GAUZE/BANDAGES/DRESSINGS) ×6 IMPLANT
GLOVE BIO SURGEON STRL SZ7.5 (GLOVE) IMPLANT
GLOVE BIOGEL PI IND STRL 7.0 (GLOVE) ×1 IMPLANT
GLOVE BIOGEL PI IND STRL 8 (GLOVE) ×7 IMPLANT
GLOVE BIOGEL PI INDICATOR 7.0 (GLOVE) ×2
GLOVE BIOGEL PI INDICATOR 8 (GLOVE) ×14
GLOVE INDICATOR 8.0 STRL GRN (GLOVE) ×6 IMPLANT
GLOVE PROTEXIS LATEX SZ 7.5 (GLOVE) ×3 IMPLANT
GLOVE SKINSENSE NS SZ7.5 (GLOVE) ×2
GLOVE SKINSENSE NS SZ8.0 LF (GLOVE) ×8
GLOVE SKINSENSE STRL SZ7.5 (GLOVE) ×1 IMPLANT
GLOVE SKINSENSE STRL SZ8.0 LF (GLOVE) ×4 IMPLANT
GLOVE SURG SYN 7.0 (GLOVE) ×9 IMPLANT
GOWN SRG LRG LVL 4 IMPRV REINF (GOWNS) ×3 IMPLANT
GOWN STRL REIN LRG LVL4 (GOWNS) ×6
GOWN STRL REUS W/ TWL LRG LVL3 (GOWN DISPOSABLE) ×8 IMPLANT
GOWN STRL REUS W/ TWL XL LVL3 (GOWN DISPOSABLE) IMPLANT
GOWN STRL REUS W/TWL LRG LVL3 (GOWN DISPOSABLE) ×16
GOWN STRL REUS W/TWL XL LVL3 (GOWN DISPOSABLE)
HEMOSTAT SURGICEL 2X3 (HEMOSTASIS) ×3 IMPLANT
IRRIGATION STRYKERFLOW (MISCELLANEOUS) ×1 IMPLANT
IRRIGATOR STRYKERFLOW (MISCELLANEOUS) ×3
KIT OSTOMY 2 PC DRNBL 2.25 STR (WOUND CARE) ×1 IMPLANT
KIT OSTOMY DRAINABLE 2.25 STR (WOUND CARE) ×2
KIT OSTOMY DRAINABLE 3.25 STR (WOUND CARE) ×3 IMPLANT
LABEL OR SOLS (LABEL) ×3 IMPLANT
LIGASURE LAP MARYLAND 5MM 37CM (ELECTROSURGICAL) IMPLANT
LIGASURE VESSEL 5MM BLUNT TIP (ELECTROSURGICAL) ×3 IMPLANT
LOOP OSTOMY BRIDGE (OSTOMY) IMPLANT
NEEDLE HYPO 22GX1.5 SAFETY (NEEDLE) ×3 IMPLANT
NEEDLE VERESS 14GA 120MM (NEEDLE) ×3 IMPLANT
NS IRRIG 1000ML POUR BTL (IV SOLUTION) ×21 IMPLANT
PACK BASIN MAJOR ARMC (MISCELLANEOUS) IMPLANT
PACK COLON CLEAN CLOSURE (MISCELLANEOUS) ×3 IMPLANT
PACK LAP CHOLECYSTECTOMY (MISCELLANEOUS) ×3 IMPLANT
PENCIL ELECTRO HAND CTR (MISCELLANEOUS) ×3 IMPLANT
RETAINER VISCERA MED (MISCELLANEOUS) ×3 IMPLANT
SLEEVE ENDOPATH XCEL 5M (ENDOMECHANICALS) ×6 IMPLANT
SOL PREP PVP 2OZ (MISCELLANEOUS) ×3
SOLUTION PREP PVP 2OZ (MISCELLANEOUS) ×1 IMPLANT
SPONGE LAP 18X18 5 PK (GAUZE/BANDAGES/DRESSINGS) ×15 IMPLANT
STAPLER CUT CVD 40MM BLUE (STAPLE) ×3 IMPLANT
STAPLER CUT RELOAD BLUE (STAPLE) ×3 IMPLANT
STAPLER PROX 25M (MISCELLANEOUS) ×3 IMPLANT
STAPLER SKIN PROX 35W (STAPLE) ×3 IMPLANT
SUT ETHILON 3-0 KS 30 BLK (SUTURE) IMPLANT
SUT MNCRL 4-0 (SUTURE) ×6
SUT MNCRL 4-0 27XMFL (SUTURE) ×3
SUT NYLON 2-0 (SUTURE) IMPLANT
SUT PDS AB 1 TP1 54 (SUTURE) ×3 IMPLANT
SUT PDS AB 1 TP1 96 (SUTURE) ×6 IMPLANT
SUT PROLENE 2 0 SH DA (SUTURE) ×3 IMPLANT
SUT SILK 2 0 SH (SUTURE) IMPLANT
SUT SILK 2 0SH CR/8 30 (SUTURE) ×3 IMPLANT
SUT SILK 3 0 SH 30 (SUTURE) ×15 IMPLANT
SUT SILK 3-0 (SUTURE) ×2 IMPLANT
SUT SILK 3-0 SH-1 18XCR BRD (SUTURE) ×1
SUT VIC AB 1 CTX 27 (SUTURE) IMPLANT
SUT VIC AB 2-0 CT1 27 (SUTURE) ×4
SUT VIC AB 2-0 CT1 TAPERPNT 27 (SUTURE) ×2 IMPLANT
SUT VIC AB 3-0 SH 27 (SUTURE)
SUT VIC AB 3-0 SH 27X BRD (SUTURE) IMPLANT
SUT VICRYL PLUS ABS 0 54 (SUTURE) IMPLANT
SUTURE MNCRL 4-0 27XMF (SUTURE) ×3 IMPLANT
SUTURE SILK 3-0 SH-1 18XCR BRD (SUTURE) ×1 IMPLANT
SYR BULB IRRIG 60ML STRL (SYRINGE) ×3 IMPLANT
TOWEL OR 17X26 4PK STRL BLUE (TOWEL DISPOSABLE) ×3 IMPLANT
TRAY FOLEY W/METER SILVER 16FR (SET/KITS/TRAYS/PACK) ×3 IMPLANT
TROCAR XCEL NON-BLD 11X100MML (ENDOMECHANICALS) ×3 IMPLANT
TROCAR XCEL NON-BLD 5MMX100MML (ENDOMECHANICALS) ×3 IMPLANT
TUBING INSUFFLATOR HEATED (MISCELLANEOUS) ×3 IMPLANT

## 2017-02-24 NOTE — Op Note (Signed)
Pre-operative Diagnosis: History of colostomy  Post-operative Diagnosis: Same  Procedure performed: Diagnostic laparoscopy, lysis of adhesions, appendectomy, mobilization of splenic flexure, resection of distal sigmoid/rectal stump, stapled anastomosis followed by suture repair of colorectal anastomosis, loop ileostomy creation  Surgeon: [name]   Assistants: Dr. Phoebe Perch  Anesthesia: General endotracheal anesthesia  ASA Class: 2  Surgeon: [name], MD FACS  Anesthesia: Gen. with endotracheal tube  Assistant: As above  Procedure Details  The patient was seen again in the Holding Room. The benefits, complications, treatment options, and expected outcomes were discussed with the patient. The risks of bleeding, infection, recurrence of symptoms, failure to resolve symptoms,  bowel injury, any of which could require further surgery were reviewed with the patient.   The patient was taken to Operating Room, identified as [place] and the procedure verified.  A Time Out was held and the above information confirmed.  Prior to the induction of general anesthesia, antibiotic prophylaxis was administered. VTE prophylaxis was in place. General endotracheal anesthesia was then administered and tolerated well. After the induction, the abdomen was prepped with Chloraprep and the perineum was prepped with Betadine and draped in the sterile fashion. The patient was positioned in the low lithotomy position.  The procedure began with laparoscopy with right upper quadrant Veress needle approach. This was performed 2 finger breath below the costal margin in the midclavicular line. After pneumoperitoneum of 15 mmHg was obtained a 5 mm Optiview trocar was placed in the abdomen. Extensive adhesions to the midline abdomen were identified and an additional right sided 5 mm trocar was able to placed under direct visualization. All the laparoscopic sites were localized with a  50-50 mixture of 1% lidocaine 0.5% Marcaine plain. From this the anterior abdominal wall adhesions were taken down using accommodation of LigaSure device and blunt dissection. Once an adequate dissection was made distally and additional right lower quadrant trocar was placed. The entire the anterior abdominal wall was able to be freed from adhesions. However, dense adhesions were noted throughout the abdomen and obtaining visualization of the pelvis was noted to be unable to be obtained safely. After 30 minutes of lysis of adhesions laparoscopically the decision was made to convert to an open procedure.  The previous midline incision was opened with a 10 blade scalpel and using both a cautery was taken to the level of peritoneum. The peritoneum was entered and with electrocautery as it was protected from the previously created pneumoperitoneum. The previously placed laparoscopic trochars were then removed. Dense adhesions were again encountered and for approximately 3 hours of lysis of adhesions was undertaken using accommodation of sharp dissection, blunt dissection, electrocautery. We were finally able to identify the rectal stump which was noted to be adhered to the cecum and terminal ileum. This was sharply dissected out. Due to the intimate relation of the appendix and incidental appendectomy was performed using a LigaSure device and a Z-plasty of the appendiceal stump with a 2-0 silk suture.  At this point with the rectal stump freed up we turned our attention to the colostomy. The colostomy site was sharply excised with a 10 blade scalpel and using both a cautery was taken to the level of fascia where was able be delivered into the abdomen. It was then freed from the remainder of the attachments with electrocautery. Again the dense adhesions required sharp dissection. The splenic flexure had to be taken down in order for adequate length to reach the pelvis. This was performed  using accommodation of  electrocautery and LigaSure device. Once the splenic flexure was freed which took tedious dissection, we had adequate length. The strictured colostomy site was sharply excised with Metzenbaum scissors. The first excision revealed no viable mucosa and additional excision approximately 5 cm from the skin site was created where healthy mucosa was identified. The remaining colon was narrow and would barely accept a 25 [place] EEA sizer.  A 25 French EEA stapler was opened and the anvil was placed into the colon and secured with a 2-0 Prolene pursestring suture. We then turned our attention to the rectum and using a sizer it was noted that the patient had an excessive amount of remaining distal sigmoid and it was unsafe to lace the stapler through the remaining rectal area. We then returned our attention to the sigmoid stump which was dissected out using LigaSure device and an additional 10 cm of distal sigmoid was excised using a contour stapler. We then again returned to the rectum where the EEA stapler was able to easily passed up to our newly created staple line and the spike was extended and married with the anvil with ease. This was then closed under direct physical sedation ensuring that no additional tissues were in the anastomosis and fired. The first of the 2 doughnuts was noted to be friable and came apart as it was removed from the stapler. A leak test performed under saline showed an obvious leaks to our anastomosis.  We then fully investigated our anastomosis and found visible mucosa on the proximal end of the anastomosis. This was repaired with interrupted figure-of-eight 3-0 silk suture. After numerous silk sutures were placed the visible mucosa was covered and additional leak test was performed which failed to show any leak of air under the saline. Given the length of time required to mobilize the bowel and the obvious leak the decision was then made to create a loop ileostomy. Starting at the  ileocecal valve and the distal ileum was sharply freed from any adhesions until there was adequate length to create our ileostomy. A site in the right lower abdomen was chosen and an elliptical incision was made with electrocautery. He was taken to the level of the fascia under direct visualization without any difficulty. It was manually widened and the loop of ileum was able to be easily delivered to the skin level.  We then returned our attention to the remainder of the abdomen which was copiously irrigated in all quadrants. The left upper quadrant near the splenic flexure was again visualized and noted to continue to bleed. Surgicel was placed over the area of bleeding and packed with a lap sponge. The remainder of the bowel was run and there is no evidence of enterotomy or damage any other parts of the bowel from the operation. At this point the lap pad and Surgicel were removed and there is no active bleeding requiring further intervention. The pelvis was copiously irrigated again.  At this point the previous colostomy site was closed internally with a running #1 PDS suture. The midline fascia was then closed with 2 looped #1 PDS sutures in a running fashion protecting the deeper bowel with a fish. The fish was removed prior to completing the closure. When the sutures were tied the midline incision was irrigated. There were evidence of suture abscesses during the closure with the PDS suture. Due to the patient's latex allergies nonlatex Foley catheters were used as bridges underneath our staple closure of both the ostomy site  and our midline incision which closed easily with staples. The previous laparoscopic incisions were also closed with staples. The loop of small bowel was then directed with the Hollister bridge. The bowel was opened with electrocautery and the loop ileostomy was matured with interrupted 2-0 silk sutures. An ostomy appliance was placed over this and secured. Entire abdomen was cleaned  and dried with a wet and dry lap sponge and sterile dressings of plain gauze and tape were placed over all of the incision sites.  The patient tolerated the procedure well. She was awoken from general endotracheal anesthesia and transferred to the PACU in good condition. All counts were correct at the end of the procedure there were no immediate, locations.  Findings: Previous colostomy   Estimated Blood Loss: 400 mL         Drains: None         Specimens: Residual colon          Complications: Anastomotic leak requiring repair                  Condition: Good   [name], MD, FACS

## 2017-02-24 NOTE — Brief Op Note (Signed)
02/24/2017  6:11 PM  PATIENT:  Kara [name]  66 y.o. female  PRE-OPERATIVE DIAGNOSIS:  DIVERTICULITIS OF COLON  POST-OPERATIVE DIAGNOSIS:  DIVERTICULITIS OF COLON  PROCEDURE:  Procedure(s): COLOSTOMY REVERSAL, ostomy takedown, spleenic flexure mobilization, excision rectal stump/distal sigmoid, anastomosis with suture reinforcement (N/A) Incidental  APPENDECTOMY (N/A) LYSIS OF ADHESION (N/A) LAPAROSCOPY DIAGNOSTIC (N/A) ILEO LOOP COLOSTOMY (N/A)  SURGEON:  Surgeon(s) and Role:    * [name], MD - Primary    * Florene Glen, MD - Assisting  PHYSICIAN ASSISTANT:   ASSISTANTS: none   ANESTHESIA:   general  EBL:  Total I/O In: 2600 [I.V.:2600] Out: 550 [Urine:150; Blood:400]  BLOOD ADMINISTERED:none  DRAINS: none   LOCAL MEDICATIONS USED:  MARCAINE    and XYLOCAINE   SPECIMEN:  Source of Specimen:  colon  DISPOSITION OF SPECIMEN:  PATHOLOGY  COUNTS:  YES  TOURNIQUET:  * No tourniquets in log *  DICTATION: .Dragon Dictation  PLAN OF CARE: Admit to inpatient   PATIENT DISPOSITION:  PACU - hemodynamically stable.   Delay start of Pharmacological VTE agent (>24hrs) due to surgical blood loss or risk of bleeding: yes

## 2017-02-24 NOTE — H&P (View-Only) (Signed)
Outpatient Surgical Follow Up  02/14/2017  Kara [name] Kara [name] is an 66 y.o. female.   Chief Complaint  Patient presents with  . Follow-up    Discuss Colostomy reversal-    HPI: 66 year old female returns to clinic for follow-up from recent barium study in workup for reversal of her colostomy. Patient reports she tolerated the procedure well. She continues to have a good appetite and appropriate ostomy output. She denies any fevers, chills, nausea, vomiting, chest pain, shortness of breath. She states that she self discontinued her aspirin and her anticoagulant in anticipation of her ostomy being reversed. She has medical clearance for no anticoagulation clearance currently.  Past Medical History:  Diagnosis Date  . Diabetes ([place])   . Diverticulitis   . Hypertension   . 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 CARPAL TUNNEL RELEASE Bilateral   . COLON RESECTION SIGMOID N/A 11/02/2016   Procedure: COLON RESECTION SIGMOID;  Surgeon: [name], MD;  Location: [name] ORS;  Service: General;  Laterality: N/A;  . COLONOSCOPY WITH PROPOFOL N/A 02/03/2017   Procedure: COLONOSCOPY WITH PROPOFOL;  Surgeon: Lin Landsman, MD;  Location: [name] ENDOSCOPY;  Service: Gastroenterology;  Laterality: N/A;  . COLOSTOMY N/A 11/02/2016   Procedure: COLOSTOMY;  Surgeon: [name], MD;  Location: [name] ORS;  Service: General;  Laterality: N/A;  . KNEE SURGERY     torn menicus  . PULMONARY VENOGRAPHY N/A 11/19/2016   Procedure: Pulmonary Venography; IVC filter placement; possible pulmonary thrombectomy;  Surgeon: Katha Cabal, MD;  Location: [place] CV LAB;  Service: Cardiovascular;  Laterality: N/A;  . SIGMOIDOSCOPY N/A 11/13/2016   Procedure: endoscopic  flexible SIGMOIDOSCOPY;  Surgeon: Florene Glen, MD;  Location: [name] ORS;  Service: General;  Laterality: N/A;    Family History  Problem Relation Age of Onset  .  Diabetes Mother   . Heart disease Mother   . Cancer Mother   . Heart disease Father   . Diabetes Sister   . Heart disease Sister     Social History:  reports that she has never smoked. She has never used smokeless tobacco. She reports that she does not drink alcohol or use drugs.  Allergies:  Allergies  Allergen Reactions  . Latex   . Morphine And Related Nausea And Vomiting    Medications reviewed.    ROS A multipoint review of systems was completed, all pertinent positives and negatives are documented within the history of present illness and the remainder are negative   BP 132/75   Pulse 94   Temp 97.7 F (36.5 C) (Oral)   Ht 5\' 3"  (1.6 m)   Wt 98 kg (216 lb)   BMI 38.26 kg/m   Physical Exam Gen.: No acute distress  neck: Supple and nontender Chest: Clear to auscultation  heart: Regular rate and rhythm Abdomen: Large, soft, nontender. End colostomy in the left lower quadrant is patent and productive of gas and stool. Extremities: No evidence of edema, moves all cavities well.   No results found for this or any previous visit (from the past 48 hour(s)). No results found.  Assessment/Plan:  1. Colostomy in place [facility]) 66 year old female with an end colostomy secondary to sigmoid diverticulitis. Discussed the process of the colostomy reversal in detail. Described the procedure for laparoscopic versus open colostomy takedown and reversal. This included the risks, benefits, alternatives. The primary risks being pain, bleeding, infection, need to convert to an open  procedure, anastomotic leak, need for ileostomy. Primary benefit being removal of ostomy. Patient voiced understanding and desires to proceed. We will seek clearance for anticoagulation and from vascular surgery due to her IVC filter secondary to pulmonary embolisms over the summer. Tentatively plan for surgery on Thursday, October 4 to be assisted by my partner Dr. Burt Knack.  A total of 25 minutes was used on  this encounter with greater than 50% of the time used for counseling and coordination of care   [name], MD [facility] General Surgeon  02/14/2017,3:02 PM

## 2017-02-24 NOTE — Anesthesia Preprocedure Evaluation (Signed)
Anesthesia Evaluation  Patient identified by MRN, date of birth, ID band Patient awake    Reviewed: Allergy & Precautions, NPO status , Patient's Chart, lab work & pertinent test results  History of Anesthesia Complications Negative for: history of anesthetic complications  Airway Mallampati: II       Dental   Pulmonary neg sleep apnea, neg COPD,           Cardiovascular hypertension, Pt. on medications +CHF (one time s/p colon resection)       Neuro/Psych neg Seizures    GI/Hepatic Neg liver ROS, neg GERD  ,  Endo/Other  diabetes, Type 2, Oral Hypoglycemic Agents  Renal/GU negative Renal ROS     Musculoskeletal   Abdominal   Peds  Hematology   Anesthesia Other Findings   Reproductive/Obstetrics                            Anesthesia Physical Anesthesia Plan  ASA: III  Anesthesia Plan: General   Post-op Pain Management:    Induction:   PONV Risk Score and Plan: 3 and Ondansetron, Dexamethasone, Midazolam and Treatment may vary due to age or medical condition  Airway Management Planned: Oral ETT  Additional Equipment:   Intra-op Plan:   Post-operative Plan:   Informed Consent: I have reviewed the patients History and Physical, chart, labs and discussed the procedure including the risks, benefits and alternatives for the proposed anesthesia with the patient or authorized representative who has indicated his/her understanding and acceptance.     Plan Discussed with:   Anesthesia Plan Comments:         Anesthesia Quick Evaluation

## 2017-02-24 NOTE — Progress Notes (Signed)
Pharmacy Antibiotic Note  Kara [name] is a 66 y.o. female admitted on 02/24/2017 with Intra-Abdominal infection.  Pharmacy has been consulted for Zosyn dosing.  S/p Colostomy reversal, appendectomy, etc  Plan: Zosyn 3.375g IV q8h (4 hour infusion).     Temp (24hrs), Avg:98 F (36.7 C), Min:97.1 F (36.2 C), Max:98.7 F (37.1 C)  No results for input(s): WBC, CREATININE, LATICACIDVEN, VANCOTROUGH, VANCOPEAK, VANCORANDOM, GENTTROUGH, GENTPEAK, GENTRANDOM, TOBRATROUGH, TOBRAPEAK, TOBRARND, AMIKACINPEAK, AMIKACINTROU, AMIKACIN in the last 168 hours.  Estimated Creatinine Clearance: 78.1 mL/min (by C-G formula based on SCr of 0.7 mg/dL).    Allergies  Allergen Reactions  . Adhesive [Tape] Other (See Comments)    Pt reports, when removing tape from skin most tapes pull her skin off. Ok to use paper tape  . Latex   . Morphine And Related Nausea And Vomiting    Antimicrobials this admission: Zosyn 10/4 >>       >>    Dose adjustments this admission:    Microbiology results:   BCx:     UCx:      Sputum:      MRSA PCR:    Thank you for allowing pharmacy to be a part of this patient's care.  Ricquel Foulk A 02/24/2017 8:51 PM

## 2017-02-24 NOTE — Anesthesia Postprocedure Evaluation (Signed)
Anesthesia Post Note  Patient: Kara [name]  Procedure(s) Performed: COLOSTOMY REVERSAL, ostomy takedown, spleenic flexure mobilization, excision rectal stump/distal sigmoid, anastomosis with suture reinforcement (N/A Abdomen) Incidental  APPENDECTOMY (N/A Abdomen) LYSIS OF ADHESION (N/A Abdomen) LAPAROSCOPY DIAGNOSTIC (N/A Abdomen) ILEO LOOP COLOSTOMY (N/A Abdomen)  Patient location during evaluation: PACU Anesthesia Type: General Level of consciousness: awake and alert Pain management: pain level controlled Vital Signs Assessment: post-procedure vital signs reviewed and stable Respiratory status: spontaneous breathing, nonlabored ventilation, respiratory function stable and patient connected to nasal cannula oxygen Cardiovascular status: blood pressure returned to baseline and stable Postop Assessment: no apparent nausea or vomiting Anesthetic complications: no     Last Vitals:  Vitals:   02/24/17 1905 02/24/17 1910  BP:  135/71  Pulse: 85 86  Resp: (!) 24 (!) 31  Temp:    SpO2: 96% 95%    Last Pain:  Vitals:   02/24/17 1910  TempSrc:   PainSc: 6                  Precious Haws [employer]

## 2017-02-24 NOTE — Anesthesia Procedure Notes (Signed)
Procedure Name: Intubation Date/Time: 02/24/2017 1:15 PM Performed by: Timoteo Expose Pre-anesthesia Checklist: Patient identified, Emergency Drugs available, Suction available, Patient being monitored and Timeout performed Patient Re-evaluated:Patient Re-evaluated prior to induction Oxygen Delivery Method: Circle system utilized Preoxygenation: Pre-oxygenation with 100% oxygen Induction Type: IV induction Ventilation: Mask ventilation without difficulty Laryngoscope Size: Mac and 3 Grade View: Grade I Tube type: Oral Tube size: 7.0 mm Number of attempts: 1 Airway Equipment and Method: Stylet Placement Confirmation: ETT inserted through vocal cords under direct vision,  positive ETCO2,  CO2 detector and breath sounds checked- equal and bilateral Secured at: 22 cm Tube secured with: Tape Dental Injury: Teeth and Oropharynx as per pre-operative assessment

## 2017-02-24 NOTE — Transfer of Care (Signed)
Immediate Anesthesia Transfer of Care Note  Patient: Kara [name]  Procedure(s) Performed: COLOSTOMY REVERSAL, ostomy takedown, spleenic flexure mobilization, excision rectal stump/distal sigmoid, anastomosis with suture reinforcement (N/A Abdomen) Incidental  APPENDECTOMY (N/A Abdomen) LYSIS OF ADHESION (N/A Abdomen) LAPAROSCOPY DIAGNOSTIC (N/A Abdomen) ILEO LOOP COLOSTOMY (N/A Abdomen)  Patient Location: PACU  Anesthesia Type:General  Level of Consciousness: awake, alert  and oriented  Airway & Oxygen Therapy: Patient Spontanous Breathing and Patient connected to face mask oxygen  Post-op Assessment: Report given to RN and Post -op Vital signs reviewed and stable  Post vital signs: Reviewed and stable  Last Vitals:  Vitals:   02/24/17 1104  BP: 130/77  Pulse: 88  Resp: 18  Temp: 36.5 C  SpO2: 98%    Last Pain:  Vitals:   02/24/17 1104  TempSrc: Tympanic         Complications: No apparent anesthesia complications

## 2017-02-24 NOTE — Anesthesia Post-op Follow-up Note (Signed)
Anesthesia QCDR form completed.        

## 2017-02-24 NOTE — Interval H&P Note (Signed)
History and Physical Interval Note:  02/24/2017 12:27 PM  Kara [name]  has presented today for surgery, with the diagnosis of DIVERTICULITIS OF COLON  The various methods of treatment have been discussed with the patient and family. After consideration of risks, benefits and other options for treatment, the patient has consented to  Procedure(s): COLOSTOMY REVERSAL (N/A) as a surgical intervention .  The patient's history has been reviewed, patient examined, no change in status, stable for surgery.  I have reviewed the patient's chart and labs.  Questions were answered to the patient's satisfaction.     [name]

## 2017-02-25 ENCOUNTER — Encounter: Payer: Self-pay | Admitting: General Surgery

## 2017-02-25 LAB — CBC
HEMATOCRIT: 39.2 % (ref 35.0–47.0)
HEMOGLOBIN: 13.6 g/dL (ref 12.0–16.0)
MCH: 30.7 pg (ref 26.0–34.0)
MCHC: 34.8 g/dL (ref 32.0–36.0)
MCV: 88.3 fL (ref 80.0–100.0)
Platelets: 211 10*3/uL (ref 150–440)
RBC: 4.44 MIL/uL (ref 3.80–5.20)
RDW: 13.6 % (ref 11.5–14.5)
WBC: 14.4 10*3/uL — ABNORMAL HIGH (ref 3.6–11.0)

## 2017-02-25 LAB — GLUCOSE, CAPILLARY
GLUCOSE-CAPILLARY: 127 mg/dL — AB (ref 65–99)
GLUCOSE-CAPILLARY: 169 mg/dL — AB (ref 65–99)
GLUCOSE-CAPILLARY: 199 mg/dL — AB (ref 65–99)
GLUCOSE-CAPILLARY: 249 mg/dL — AB (ref 65–99)
Glucose-Capillary: 212 mg/dL — ABNORMAL HIGH (ref 65–99)
Glucose-Capillary: 180 mg/dL — ABNORMAL HIGH (ref 65–99)
Glucose-Capillary: 170 mg/dL — ABNORMAL HIGH (ref 65–99)
Glucose-Capillary: 116 mg/dL — ABNORMAL HIGH (ref 65–99)

## 2017-02-25 LAB — BASIC METABOLIC PANEL
ANION GAP: 7 (ref 5–15)
BUN: 16 mg/dL (ref 6–20)
CALCIUM: 8.6 mg/dL — AB (ref 8.9–10.3)
CHLORIDE: 111 mmol/L (ref 101–111)
CO2: 22 mmol/L (ref 22–32)
CREATININE: 0.84 mg/dL (ref 0.44–1.00)
GFR calc non Af Amer: >60 mL/min (ref >60)
GLUCOSE: 209 mg/dL — AB (ref 65–99)
Potassium: 4 mmol/L (ref 3.5–5.1)
Sodium: 140 mmol/L (ref 135–145)

## 2017-02-25 MED ORDER — MUPIROCIN 2 % EX OINT
1.0000 "application " | TOPICAL_OINTMENT | Freq: Two times a day (BID) | CUTANEOUS | Status: AC
  Administered 2017-02-25 – 2017-03-01 (×9): 1 via NASAL
  Filled 2017-02-25: qty 22

## 2017-02-25 MED ORDER — INSULIN ASPART 100 UNIT/ML ~~LOC~~ SOLN
0.0000 [IU] | Freq: Three times a day (TID) | SUBCUTANEOUS | Status: DC
  Administered 2017-02-25: 4 [IU] via SUBCUTANEOUS
  Administered 2017-02-25: 3 [IU] via SUBCUTANEOUS
  Administered 2017-02-26 (×3): 4 [IU] via SUBCUTANEOUS
  Administered 2017-02-27 – 2017-03-02 (×3): 3 [IU] via SUBCUTANEOUS
  Filled 2017-02-25 (×8): qty 1

## 2017-02-25 MED ORDER — INFLUENZA VAC SPLIT HIGH-DOSE 0.5 ML IM SUSY
0.5000 mL | PREFILLED_SYRINGE | INTRAMUSCULAR | Status: AC
  Administered 2017-03-01: 0.5 mL via INTRAMUSCULAR
  Filled 2017-02-25: qty 0.5

## 2017-02-25 MED ORDER — PROCHLORPERAZINE EDISYLATE 5 MG/ML IJ SOLN
10.0000 mg | Freq: Four times a day (QID) | INTRAMUSCULAR | Status: DC | PRN
  Administered 2017-02-25 – 2017-02-26 (×4): 10 mg via INTRAVENOUS
  Filled 2017-02-25 (×6): qty 2

## 2017-02-25 MED ORDER — CHLORHEXIDINE GLUCONATE CLOTH 2 % EX PADS: 6.0000 | MEDICATED_PAD | Freq: Every day | CUTANEOUS | Status: DC

## 2017-02-25 NOTE — Progress Notes (Signed)
Spoke with Dr. Dahlia Byes about additional nausea medication per patient request.  Doctor placed appropriate orders.  Christene Slates  02/25/2017 11:36 PM

## 2017-02-25 NOTE — Progress Notes (Signed)
Pharmacy Antibiotic Note  Kara [name] is a 66 y.o. female admitted on 02/24/2017 with Intra-Abdominal infection.  Pharmacy has been consulted for piperacillin/tazobactam dosing.  S/p Colostomy reversal, appendectomy, etc  Plan: Continue piperacillin/tazobactam 3.375 g IV q8h EI  Height: 5\' 3"  (160 cm) Weight: 216 lb (98 kg) IBW/kg (Calculated) : 52.4  Temp (24hrs), Avg:98.2 F (36.8 C), Min:97.1 F (36.2 C), Max:98.7 F (37.1 C)   Recent Labs Lab 02/24/17 2034 02/25/17 0517  WBC 15.6* 14.4*  CREATININE 1.17* 0.84    Estimated Creatinine Clearance: 74.4 mL/min (by C-G formula based on SCr of 0.84 mg/dL).    Allergies  Allergen Reactions  . Adhesive [Tape] Other (See Comments)    Pt reports, when removing tape from skin most tapes pull her skin off. Ok to use paper tape  . Latex   . Other Hives    Chlorhexadine wipes/CHG wipes  . Morphine And Related Nausea And Vomiting    Antimicrobials this admission: Zosyn 10/4 >>      Microbiology results: None sent  Thank you for allowing pharmacy to be a part of this patient's care.  Lenis Noon, PharmD, BCPS Clinical Pharmacist 02/25/2017 1:57 PM

## 2017-02-25 NOTE — Progress Notes (Signed)
Patient seen and examined. See Dr. Antionette Char note for daily progress. Discussed the operative findings and how her procedure went in detail. Agree with Dr. Burt Knack, continue clear liquids today. Encourage out of bed to chair. Encourage incentive spirometer usage. Plan to reevaluate later today whether not she continues need her PCA.  [name], MD [facility]  Day [facility] [phone] Night [facility] [phone]

## 2017-02-25 NOTE — Progress Notes (Signed)
Initial Nutrition Assessment  DOCUMENTATION CODES:   Obesity unspecified  INTERVENTION:   Pt declines all nutrition interventions   NUTRITION DIAGNOSIS:   Unintentional weight loss related to acute illness (Diverticulitis with perforation ) as evidenced by 8.5 percent weight loss in 4 months.  GOAL:   Patient will meet greater than or equal to 90% of their needs  MONITOR:   PO intake, Labs, Weight trends  REASON FOR ASSESSMENT:   Malnutrition Screening Tool    ASSESSMENT:   66 y/o female with h/o diverticulitis with perforation in June 2018; pt underwent Hartman's procedure at that time. Pt's admit complicated with a DVT, PE requiring thrombectomy. 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 Pt s/p Hartman's procedure in June of this year secondary to diverticulitis with perforation. Pt reports that her appetite was poor at this time and that she wasn't able to eat much and started to loose weight. Pt reports that her appetite did increase after discharge but has never really returned to normal. Pt reports that she last ate a good meal on Monday night, but then Tuesday she began colonoscopy prep and wasn't allowed any food. Pt is currently on a clear liquid diet; pt did eat some broth today and a little jello. Per chart, pt has lost 20lbs(8%) in 4 months; this is significant given history. No BM yet; pt reports that she was producing normal stools in her ostomy bag prior to colon prep. Pt does not like supplements and declines any nutrition interventions at this time.   Medications reviewed and include: fentanyl, insulin, zosyn, LRS '@125ml' /hr  Labs reviewed: Ca 8.6(L) WBC-  14.4(H) cbgs- 286, 209 x 24 hrs AIC 7.2(H)- 9/27  Nutrition-Focused physical exam completed. Findings are no fat depletion, no muscle depletion, and no edema.   Diet Order:  Diet clear liquid Room service appropriate? Yes; Fluid consistency: Thin  Skin:  Wound (see comment) (incision abdomen and perineum )  Last BM:  PTA  Height:   Ht Readings from Last 1 Encounters:  02/17/17 '5\' 3"'  (1.6 m)    Weight:   Wt Readings from Last 1 Encounters:  02/17/17 216 lb (98 kg)    Ideal Body Weight:  52.3 kg  BMI:  There is no height or weight on file to calculate BMI.  Estimated Nutritional Needs:   Kcal:  1650-1950kcal/day   Protein:  98-108g/day   Fluid:  >1.6L/day   EDUCATION NEEDS:   Education needs addressed  Koleen Distance MS, RD, LDN Pager #[phone] After Hours Pager: [phone]

## 2017-02-25 NOTE — Care Management (Signed)
Patient from home.  PCP [facility].  Pharmacy [place]  Postop day 1 from colostomy closure with diverting ileostomy.  Patient was previously discharged to [facility] when she had the creation of the initial ostomy.  WOC from nurse.  Patient pay benefit home health nurse at discharge for continued support of new ostomy. RNCM following

## 2017-02-25 NOTE — Progress Notes (Signed)
1 Day Post-Op  Subjective: Postop day 1 from colostomy closure with diverting ileostomy. Patient has considerable pain and questions concerning the operation and the ileostomy.  Objective: Vital signs in last 24 hours: Temp:  [97.1 F (36.2 C)-98.7 F (37.1 C)] 98.4 F (36.9 C) (10/05 0534) Pulse Rate:  [84-102] 102 (10/05 0534) Resp:  [18-36] 24 (10/05 0534) BP: (113-155)/(56-86) 155/86 (10/05 0534) SpO2:  [92 %-99 %] 93 % (10/05 0534)    Intake/Output from previous day: 10/04 0701 - 10/05 0700 In: 3876 [P.O.:1; I.V.:3835; IV Piggyback:40] Out: 1000 [Urine:600; Blood:400] Intake/Output this shift: No intake/output data recorded.  Physical exam:  Vital signs reviewed. Abdomen is obese soft viable ileostomy minimally tender wounds are clean. Calves are nontender.  Lab Results: CBC   Recent Labs  02/24/17 2034 02/25/17 0517  WBC 15.6* 14.4*  HGB 14.2 13.6  HCT 42.7 39.2  PLT 210 211   BMET  Recent Labs  02/24/17 2034 02/25/17 0517  NA 141 140  K 4.1 4.0  CL 110 111  CO2 21* 22  GLUCOSE 286* 209*  BUN 15 16  CREATININE 1.17* 0.84  CALCIUM 8.2* 8.6*   PT/INR No results for input(s): LABPROT, INR in the last 72 hours. ABG No results for input(s): PHART, HCO3 in the last 72 hours.  Invalid input(s): PCO2, PO2  Studies/Results: No results found.  Anti-infectives: Anti-infectives    Start     Dose/Rate Route Frequency Ordered Stop   02/24/17 2200  piperacillin-tazobactam (ZOSYN) IVPB 3.375 g     3.375 g 12.5 mL/hr over 240 Minutes Intravenous Every 8 hours 02/24/17 2050     02/24/17 0600  cefoTEtan (CEFOTAN) 2 g in dextrose 5 % 50 mL IVPB     2 g 100 mL/hr over 30 Minutes Intravenous On call to O.R. 02/23/17 2242 02/24/17 1700      Assessment/Plan: s/p Procedure(s): COLOSTOMY REVERSAL, ostomy takedown, spleenic flexure mobilization, excision rectal stump/distal sigmoid, anastomosis with suture reinforcement Incidental  APPENDECTOMY LYSIS OF  ADHESION LAPAROSCOPY DIAGNOSTIC ILEO LOOP COLOSTOMY   Patient is doing overall quite well at this point. I discussed with her the findings at operation and the need for the ileostomy and reinforced for her that her colostomy is closed and her colon is continuous at this point. Reviewed for her ileostomy closure at a later date. She is on clear liquids at this point we will continue current care.  Florene Glen, MD, FACS  02/25/2017

## 2017-02-25 NOTE — Progress Notes (Signed)
I asked Dr Burt Knack if I could get an order for oral pain med.  He did not want to order it at this time. (too soon from surgery)

## 2017-02-25 NOTE — Consult Note (Signed)
[place] Nurse ostomy consult note Stoma type/location: RLQ Ileostomy after colostomy reversal.  Stomal assessment/size: 1 1/4", slightly oval and edemaotus. Plastic retention rod in place.  Peristomal assessment: not assessed.  Patient does not want pouch change.  It is intact and she would like to wait.  She has just gone back to bed and is uncomfortable.  Treatment options for stomal/peristomal skin: Will add barrier ring and convex pouch system Output Blood tinged liquid Ostomy pouching: 2pc. 2 1/4" POuch will be implemented at next pouch change.   Education provided: Discussed differences between colostomy and ileostomy.  Written materials provided.  Enrolled patient in [place] program: No Will not follow at this time.  Please re-consult if needed.  Domenic Moras RN BSN [place] Pager [phone]

## 2017-02-25 NOTE — Progress Notes (Signed)
Patient placed on isolation due to positive PCR.  Dr Adonis Huguenin gave orders for clear liquid diet.  CBG changed to ac and hs

## 2017-02-26 LAB — BASIC METABOLIC PANEL
Anion gap: 9 (ref 5–15)
BUN: 12 mg/dL (ref 6–20)
CHLORIDE: 105 mmol/L (ref 101–111)
CO2: 25 mmol/L (ref 22–32)
CREATININE: 0.66 mg/dL (ref 0.44–1.00)
Calcium: 8.4 mg/dL — ABNORMAL LOW (ref 8.9–10.3)
GFR calc non Af Amer: >60 mL/min (ref >60)
Glucose, Bld: 188 mg/dL — ABNORMAL HIGH (ref 65–99)
POTASSIUM: 4.2 mmol/L (ref 3.5–5.1)
Sodium: 139 mmol/L (ref 135–145)

## 2017-02-26 LAB — GLUCOSE, CAPILLARY
GLUCOSE-CAPILLARY: 166 mg/dL — AB (ref 65–99)
GLUCOSE-CAPILLARY: 160 mg/dL — AB (ref 65–99)
GLUCOSE-CAPILLARY: 120 mg/dL — AB (ref 65–99)
Glucose-Capillary: 179 mg/dL — ABNORMAL HIGH (ref 65–99)
Glucose-Capillary: 156 mg/dL — ABNORMAL HIGH (ref 65–99)

## 2017-02-26 LAB — CBC
HEMATOCRIT: 41.7 % (ref 35.0–47.0)
HEMOGLOBIN: 14.3 g/dL (ref 12.0–16.0)
MCH: 30.6 pg (ref 26.0–34.0)
MCHC: 34.2 g/dL (ref 32.0–36.0)
MCV: 89.4 fL (ref 80.0–100.0)
Platelets: 234 10*3/uL (ref 150–440)
RBC: 4.66 MIL/uL (ref 3.80–5.20)
RDW: 13.7 % (ref 11.5–14.5)
WBC: 16.6 10*3/uL — AB (ref 3.6–11.0)

## 2017-02-26 MED ORDER — ENOXAPARIN SODIUM 40 MG/0.4ML ~~LOC~~ SOLN
40.0000 mg | SUBCUTANEOUS | Status: DC
  Administered 2017-02-26 – 2017-02-27 (×2): 40 mg via SUBCUTANEOUS
  Filled 2017-02-26 (×2): qty 0.4

## 2017-02-26 MED ORDER — PANTOPRAZOLE SODIUM 40 MG IV SOLR
40.0000 mg | Freq: Two times a day (BID) | INTRAVENOUS | Status: DC
  Administered 2017-02-26 – 2017-02-28 (×7): 40 mg via INTRAVENOUS
  Filled 2017-02-26 (×7): qty 40

## 2017-02-26 NOTE — Progress Notes (Signed)
2 Days Post-Op  Subjective: Patient complains of nausea. Minimal pain. No ostomy output yet  Objective: Vital signs in last 24 hours: Temp:  [97.8 F (36.6 C)-98.5 F (36.9 C)] 97.8 F (36.6 C) (10/06 0412) Pulse Rate:  [95-118] 118 (10/06 0412) Resp:  [16-47] 36 (10/06 0646) BP: (145-156)/(74-92) 156/92 (10/06 0412) SpO2:  [93 %-97 %] 96 % (10/06 0901) Weight:  [216 lb (98 kg)] 216 lb (98 kg) (10/05 1225)    Intake/Output from previous day: 10/05 0701 - 10/06 0700 In: 1677.1 [I.V.:1577.1; IV Piggyback:100] Out: 1700 [Urine:1700] Intake/Output this shift: No intake/output data recorded.  Physical exam:  Wounds are clean no erythema no drainage soft nontender abdomen except around the incision. After nontender  Lab Results: CBC   Recent Labs  02/25/17 0517 02/26/17 0409  WBC 14.4* 16.6*  HGB 13.6 14.3  HCT 39.2 41.7  PLT 211 234   BMET  Recent Labs  02/25/17 0517 02/26/17 0409  NA 140 139  K 4.0 4.2  CL 111 105  CO2 22 25  GLUCOSE 209* 188*  BUN 16 12  CREATININE 0.84 0.66  CALCIUM 8.6* 8.4*   PT/INR No results for input(s): LABPROT, INR in the last 72 hours. ABG No results for input(s): PHART, HCO3 in the last 72 hours.  Invalid input(s): PCO2, PO2  Studies/Results: No results found.  Anti-infectives: Anti-infectives    Start     Dose/Rate Route Frequency Ordered Stop   02/24/17 2200  piperacillin-tazobactam (ZOSYN) IVPB 3.375 g     3.375 g 12.5 mL/hr over 240 Minutes Intravenous Every 8 hours 02/24/17 2050     02/24/17 0600  cefoTEtan (CEFOTAN) 2 g in dextrose 5 % 50 mL IVPB     2 g 100 mL/hr over 30 Minutes Intravenous On call to O.R. 02/23/17 2242 02/24/17 1700      Assessment/Plan: s/p Procedure(s): COLOSTOMY REVERSAL, ostomy takedown, spleenic flexure mobilization, excision rectal stump/distal sigmoid, anastomosis with suture reinforcement Incidental  APPENDECTOMY LYSIS OF ADHESION LAPAROSCOPY DIAGNOSTIC ILEO LOOP  COLOSTOMY   White blood cell count of 16,000 Nausea is being controlled with medications. Currently awaiting bowel function. We'll get her out of bed to a chair today.  Florene Glen, MD, FACS  02/26/2017

## 2017-02-26 NOTE — Progress Notes (Signed)
Called Dr. Dahlia Byes regarding medication for heartburn per patient request.  Appropriate orders were placed.  Phoebe Sharps N  02/26/2017 1:14 AM

## 2017-02-26 NOTE — Consult Note (Signed)
[place] Nurse ostomy follow up Stoma type/location: RLQ ileostomy Stomal assessment/size: oval shaped, 3 cm wide and 1 cm length, pink and moist.  Plastic retention rod in place. Easily manipulated for pouching.   Blood tinged liquid only in pouch.  Patient is nauseous, flushed and "not feeling well".  WBC count elevated today, post op Day 2. Discussed with bedside RN.      Peristomal assessment: Intact.  Midline incision is closed with staples and has drain at proximal and distal end.  Treatment options for stomal/peristomal skin: Barrier ring and 2 piece system.  Output 100 cc blood tinged liquid Ostomy pouching: 2pc. Flat with barrier ring  Education provided: Discussed emptying when 1/3 full, differences between colostomy and ileostomy.  Changing pouch two to three times weekly.   Enrolled patient in [place] Start Discharge program: No WOC team will follow and remain available to patient, medical and nursing teams.   Domenic Moras RN BSN [facility] Pager [phone]

## 2017-02-27 ENCOUNTER — Inpatient Hospital Stay: Payer: PPO

## 2017-02-27 LAB — BASIC METABOLIC PANEL
Anion gap: 7 (ref 5–15)
BUN: 10 mg/dL (ref 6–20)
CHLORIDE: 104 mmol/L (ref 101–111)
CO2: 27 mmol/L (ref 22–32)
CREATININE: 0.75 mg/dL (ref 0.44–1.00)
Calcium: 8.1 mg/dL — ABNORMAL LOW (ref 8.9–10.3)
GFR calc Af Amer: >60 mL/min (ref >60)
GFR calc non Af Amer: >60 mL/min (ref >60)
GLUCOSE: 144 mg/dL — AB (ref 65–99)
POTASSIUM: 3.8 mmol/L (ref 3.5–5.1)
Sodium: 138 mmol/L (ref 135–145)

## 2017-02-27 LAB — GLUCOSE, CAPILLARY
GLUCOSE-CAPILLARY: 135 mg/dL — AB (ref 65–99)
Glucose-Capillary: 127 mg/dL — ABNORMAL HIGH (ref 65–99)
Glucose-Capillary: 129 mg/dL — ABNORMAL HIGH (ref 65–99)
Glucose-Capillary: 101 mg/dL — ABNORMAL HIGH (ref 65–99)
Glucose-Capillary: 121 mg/dL — ABNORMAL HIGH (ref 65–99)
Glucose-Capillary: 92 mg/dL (ref 65–99)

## 2017-02-27 LAB — CBC
HCT: 36.2 % (ref 35.0–47.0)
HEMOGLOBIN: 12.6 g/dL (ref 12.0–16.0)
MCH: 30.8 pg (ref 26.0–34.0)
MCHC: 34.8 g/dL (ref 32.0–36.0)
MCV: 88.4 fL (ref 80.0–100.0)
Platelets: 220 10*3/uL (ref 150–440)
RBC: 4.09 MIL/uL (ref 3.80–5.20)
RDW: 13.6 % (ref 11.5–14.5)
WBC: 11.7 10*3/uL — ABNORMAL HIGH (ref 3.6–11.0)

## 2017-02-27 NOTE — Progress Notes (Signed)
3 Days Post-Op  Subjective: Status post colostomy closure and diverting ileostomy. Patient feels well today and wants to advance her diet. No fevers or chills.  Objective: Vital signs in last 24 hours: Temp:  [97.5 F (36.4 C)-98.6 F (37 C)] 97.5 F (36.4 C) (10/07 0419) Pulse Rate:  [100-113] 100 (10/07 0419) Resp:  [18-30] 27 (10/07 0800) BP: (143-160)/(74-83) 143/74 (10/07 0419) SpO2:  [89 %-96 %] 96 % (10/07 0800)    Intake/Output from previous day: 10/06 0701 - 10/07 0700 In: -  Out: 1450 [Urine:1450] Intake/Output this shift: No intake/output data recorded.  Physical exam:  Wounds are clean no erythema ostomy output is starting. Abdomen is otherwise soft and nontender. Nontender calves  Lab Results: CBC   Recent Labs  02/26/17 0409 02/27/17 0341  WBC 16.6* 11.7*  HGB 14.3 12.6  HCT 41.7 36.2  PLT 234 220   BMET  Recent Labs  02/26/17 0409 02/27/17 0341  NA 139 138  K 4.2 3.8  CL 105 104  CO2 25 27  GLUCOSE 188* 144*  BUN 12 10  CREATININE 0.66 0.75  CALCIUM 8.4* 8.1*   PT/INR No results for input(s): LABPROT, INR in the last 72 hours. ABG No results for input(s): PHART, HCO3 in the last 72 hours.  Invalid input(s): PCO2, PO2  Studies/Results: No results found.  Anti-infectives: Anti-infectives    Start     Dose/Rate Route Frequency Ordered Stop   02/24/17 2200  piperacillin-tazobactam (ZOSYN) IVPB 3.375 g     3.375 g 12.5 mL/hr over 240 Minutes Intravenous Every 8 hours 02/24/17 2050     02/24/17 0600  cefoTEtan (CEFOTAN) 2 g in dextrose 5 % 50 mL IVPB     2 g 100 mL/hr over 30 Minutes Intravenous On call to O.R. 02/23/17 2242 02/24/17 1700      Assessment/Plan: s/p Procedure(s): COLOSTOMY REVERSAL, ostomy takedown, spleenic flexure mobilization, excision rectal stump/distal sigmoid, anastomosis with suture reinforcement Incidental  APPENDECTOMY LYSIS OF ADHESION LAPAROSCOPY DIAGNOSTIC ILEO LOOP COLOSTOMY   White blood cell  count remains slightly elevated. We will advance diet to full liquids for right now. Patient is doing quite well DC Foley catheter.  Florene Glen, MD, FACS  02/27/2017

## 2017-02-28 LAB — GLUCOSE, CAPILLARY
GLUCOSE-CAPILLARY: 99 mg/dL (ref 65–99)
GLUCOSE-CAPILLARY: 106 mg/dL — AB (ref 65–99)
GLUCOSE-CAPILLARY: 88 mg/dL (ref 65–99)
Glucose-Capillary: 107 mg/dL — ABNORMAL HIGH (ref 65–99)
Glucose-Capillary: 115 mg/dL — ABNORMAL HIGH (ref 65–99)
Glucose-Capillary: 95 mg/dL (ref 65–99)

## 2017-02-28 LAB — SURGICAL PATHOLOGY

## 2017-02-28 MED ORDER — CYCLOBENZAPRINE HCL 10 MG PO TABS: 10.0000 mg | ORAL_TABLET | Freq: Every day | ORAL | Status: DC | PRN

## 2017-02-28 MED ORDER — DICLOFENAC SODIUM 75 MG PO TBEC
75.0000 mg | DELAYED_RELEASE_TABLET | Freq: Every day | ORAL | Status: DC
  Administered 2017-02-28 – 2017-03-01 (×2): 75 mg via ORAL
  Filled 2017-02-28 (×3): qty 1

## 2017-02-28 MED ORDER — RIVAROXABAN 20 MG PO TABS
20.0000 mg | ORAL_TABLET | Freq: Every day | ORAL | Status: DC
  Administered 2017-02-28 – 2017-03-02 (×3): 20 mg via ORAL
  Filled 2017-02-28 (×3): qty 1

## 2017-02-28 MED ORDER — LOSARTAN POTASSIUM 50 MG PO TABS
50.0000 mg | ORAL_TABLET | Freq: Every day | ORAL | Status: DC
  Administered 2017-02-28 – 2017-03-02 (×3): 50 mg via ORAL
  Filled 2017-02-28 (×3): qty 1

## 2017-02-28 MED ORDER — HYDROCODONE-ACETAMINOPHEN 5-325 MG PO TABS
1.0000 | ORAL_TABLET | Freq: Four times a day (QID) | ORAL | Status: DC | PRN
  Administered 2017-02-28: 2 via ORAL
  Administered 2017-02-28: 1 via ORAL
  Filled 2017-02-28 (×2): qty 1
  Filled 2017-02-28: qty 2

## 2017-02-28 MED ORDER — OXYCODONE-ACETAMINOPHEN 7.5-325 MG PO TABS
2.0000 | ORAL_TABLET | Freq: Four times a day (QID) | ORAL | Status: DC | PRN
  Administered 2017-02-28 – 2017-03-02 (×4): 2 via ORAL
  Filled 2017-02-28 (×4): qty 2

## 2017-02-28 MED ORDER — GLIPIZIDE ER 5 MG PO TB24
5.0000 mg | ORAL_TABLET | Freq: Every day | ORAL | Status: DC
  Administered 2017-02-28 – 2017-03-02 (×2): 5 mg via ORAL
  Filled 2017-02-28 (×3): qty 1

## 2017-02-28 NOTE — Care Management (Signed)
[facility] Lemmings can accept referral for home health nurse and possibly physical therapy.  Patient will need to discharge with at least one week's worth of supplies.  Have paged karen regarding enrollment in the [facility] discharge program.

## 2017-02-28 NOTE — Care Management (Signed)
Spoke with Kara [name] with wound care regarding discharge plans. She is advocating for home health at discharge.  Patient has performed self ostomy care in the past but this ostomy is more complex with a retention rod. Patient is agreeable and agency preference is [facility]. has used this agency in the past and had current insurance- [employer] ([employer]).  Spoke with [facility] and informed has served this patient in the past but agency no longer accepts the insurance. Patient is aware of copay.  Patient has had a recent skilled nursing stay.  She is on contact precautions due to positive PCR so is not able to ambulate outside of her hospital room.  Spoke with primary nurse and discussed if patient was having any difficulty with ambulation, would need physical therapy consult.

## 2017-02-28 NOTE — Progress Notes (Signed)
Pharmacy Antibiotic Note  Kara [name] is a 66 y.o. female admitted on 02/24/2017 with Intra-Abdominal infection.  Pharmacy has been consulted for piperacillin/tazobactam dosing.  S/p Colostomy reversal, appendectomy, etc  Plan: Continue piperacillin/tazobactam 3.375 g IV q8h EI  Height: 5\' 3"  (160 cm) Weight: 216 lb (98 kg) IBW/kg (Calculated) : 52.4  Temp (24hrs), Avg:98 F (36.7 C), Min:97.6 F (36.4 C), Max:98.4 F (36.9 C)   Recent Labs Lab 02/24/17 2034 02/25/17 0517 02/26/17 0409 02/27/17 0341  WBC 15.6* 14.4* 16.6* 11.7*  CREATININE 1.17* 0.84 0.66 0.75    Estimated Creatinine Clearance: 78.1 mL/min (by C-G formula based on SCr of 0.75 mg/dL).    Allergies  Allergen Reactions  . Adhesive [Tape] Other (See Comments)    Pt reports, when removing tape from skin most tapes pull her skin off. Ok to use paper tape  . Latex   . Other Hives    Chlorhexadine wipes/CHG wipes  . Morphine And Related Nausea And Vomiting    Antimicrobials this admission: Zosyn 10/4 >>      Microbiology results: None sent  Thank you for allowing pharmacy to be a part of this patient's care.  Larene Beach, PharmD, BCPS Clinical Pharmacist 02/28/2017 9:43 AM

## 2017-02-28 NOTE — Progress Notes (Signed)
4 Days Post-Op   Subjective:  66 year old female who is 4 days postop from an extensive laparotomy with colostomy reversal loop ileostomy creation. She has been a bleeding states her pain is well-controlled. She desires to [place] for PCA because BP has been keeping her up at night. States her but is uncomfortable and prefers to be upright. Has been tolerating some of her diet but states that some of the broth have made her nauseous. She desires something more substantial. Ostomy output has become a bilious fluid.  Vital signs in last 24 hours: Temp:  [97.6 F (36.4 C)-98.4 F (36.9 C)] 97.6 F (36.4 C) (10/08 0434) Pulse Rate:  [97-102] 100 (10/08 0434) Resp:  [19-41] 29 (10/08 0454) BP: (148-166)/(76-121) 153/90 (10/08 0434) SpO2:  [94 %-98 %] 94 % (10/08 0454)    Intake/Output from previous day: 10/07 0701 - 10/08 0700 In: -  Out: 525 [Urine:400; Stool:125]  GI: Abdomen is large, soft, appropriately tender to palpation at the incision sites. Midline incision well approximated over a nonlatex catheter. No evidence of spreading erythema or purulence. Left upper quadrant prior ostomy site also closed with staples over a nonlatex catheter with appropriate drainage but no evidence of spreading erythema. Loop ileostomy present in the right lower quadrant that is edematous but pink and patent with a bilious fluid in the bag. Bilious fluid in the bag  Lab Results:  CBC  Recent Labs  02/26/17 0409 02/27/17 0341  WBC 16.6* 11.7*  HGB 14.3 12.6  HCT 41.7 36.2  PLT 234 220   CMP     Component Value Date/Time   NA 138 02/27/2017 0341   NA 139 05/30/2013 0520   K 3.8 02/27/2017 0341   K 3.6 05/30/2013 0520   CL 104 02/27/2017 0341   CL 108 (H) 05/30/2013 0520   CO2 27 02/27/2017 0341   CO2 27 05/30/2013 0520   GLUCOSE 144 (H) 02/27/2017 0341   GLUCOSE 98 05/30/2013 0520   BUN 10 02/27/2017 0341   BUN 11 05/30/2013 0520   CREATININE 0.75 02/27/2017 0341   CREATININE 0.90  05/30/2013 0520   CALCIUM 8.1 (L) 02/27/2017 0341   CALCIUM 7.9 (L) 05/30/2013 0520   PROT 6.5 11/01/2016 0501   PROT 8.2 05/29/2013 0631   ALBUMIN 3.2 (L) 11/01/2016 0501   ALBUMIN 3.8 05/29/2013 0631   AST 32 11/01/2016 0501   AST 33 05/29/2013 0631   ALT 38 11/01/2016 0501   ALT 54 05/29/2013 0631   ALKPHOS 47 11/01/2016 0501   ALKPHOS 64 05/29/2013 0631   BILITOT 0.7 11/01/2016 0501   BILITOT 0.5 05/29/2013 0631   GFRNONAA >60 02/27/2017 0341   GFRNONAA >60 05/30/2013 0520   GFRAA >60 02/27/2017 0341   GFRAA >60 05/30/2013 0520   PT/INR No results for input(s): LABPROT, INR in the last 72 hours.  Studies/Results: Dg Chest Port 1 View  Result Date: 02/27/2017 CLINICAL DATA:  66 year old female with history of chest tightness and breathing difficulty. EXAM: PORTABLE CHEST 1 VIEW COMPARISON:  Chest for 11/17/2013. FINDINGS: Lung volumes are low. Patchy multifocal irregular opacities in lung bases bilaterally (left) which may reflect areas of atelectasis and/or airspace consolidation. Small left pleural effusion. No right pleural effusion. No evidence of pulmonary edema heart size is normal. The patient is rotated to the left on today's exam, resulting in distortion of the mediastinal contours and reduced diagnostic sensitivity and specificity for mediastinal pathology. Aortic atherosclerosis. IMPRESSION: 1. Volumes with bibasilar areas of atelectasis and/or airspace consolidation (  left greater than right). 2. Small left pleural effusion. 3. Aortic atherosclerosis. Electronically Signed   By: Vinnie Langton M.D.   On: 02/27/2017 10:44    Assessment/Plan: 66 year old female status post extensive laparotomy with colostomy reversal of loop ileostomy creation. Appears to be doing much better today. Encourage ambulation and incentive spirometer use. Advanced diet to soft diet today. Plan to discontinue her PCA and transitioned to oral medications. Also plan to restart her oral medications  today. If she continues to improve with current rate anticipate discharge in the next 48 hours.   [name], MD [facility]  Day [facility] [phone] Night [facility] [phone]  02/28/2017

## 2017-02-28 NOTE — Consult Note (Signed)
[place] Nurse ostomy follow up Stoma type/location: RLQ ileostomy Stomal assessment/size: oval shaped 3 cm wide and 1 cm in length, pink and moist.  Liquid green effluent in pouch.   Did not want pouch changed today.  She had a colostomy before and would like to see if the seal will hold another day to determine wear time.  Is comfortable with emptying.  Needs reinforcement on cutting oval stoma.  Uses large barrier rings at home and would like to continue those.  Supplies left in room for discharge.  Re-order numbers written in educational booklet (Back cover).  She will discharge home soon.   Domenic Moras RN BSN [place] Pager [phone]

## 2017-03-01 LAB — GLUCOSE, CAPILLARY
GLUCOSE-CAPILLARY: 108 mg/dL — AB (ref 65–99)
Glucose-Capillary: 71 mg/dL (ref 65–99)
Glucose-Capillary: 74 mg/dL (ref 65–99)
Glucose-Capillary: 105 mg/dL — ABNORMAL HIGH (ref 65–99)
Glucose-Capillary: 99 mg/dL (ref 65–99)

## 2017-03-01 LAB — CBC
HCT: 33.7 % — ABNORMAL LOW (ref 35.0–47.0)
Hemoglobin: 11.8 g/dL — ABNORMAL LOW (ref 12.0–16.0)
MCH: 30.7 pg (ref 26.0–34.0)
MCHC: 35 g/dL (ref 32.0–36.0)
MCV: 87.8 fL (ref 80.0–100.0)
PLATELETS: 288 10*3/uL (ref 150–440)
RBC: 3.84 MIL/uL (ref 3.80–5.20)
RDW: 13.6 % (ref 11.5–14.5)
WBC: 7.3 10*3/uL (ref 3.6–11.0)

## 2017-03-01 LAB — BASIC METABOLIC PANEL
Anion gap: 8 (ref 5–15)
BUN: 11 mg/dL (ref 6–20)
CO2: 28 mmol/L (ref 22–32)
CREATININE: 0.59 mg/dL (ref 0.44–1.00)
Calcium: 8.1 mg/dL — ABNORMAL LOW (ref 8.9–10.3)
Chloride: 104 mmol/L (ref 101–111)
GFR calc Af Amer: >60 mL/min (ref >60)
GLUCOSE: 79 mg/dL (ref 65–99)
POTASSIUM: 3.2 mmol/L — AB (ref 3.5–5.1)
Sodium: 140 mmol/L (ref 135–145)

## 2017-03-01 MED ORDER — AMOXICILLIN-POT CLAVULANATE 875-125 MG PO TABS
1.0000 | ORAL_TABLET | Freq: Two times a day (BID) | ORAL | Status: DC
  Administered 2017-03-01 – 2017-03-02 (×3): 1 via ORAL
  Filled 2017-03-01 (×3): qty 1

## 2017-03-01 MED ORDER — KETOROLAC TROMETHAMINE 30 MG/ML IJ SOLN: 15.0000 mg | Freq: Four times a day (QID) | INTRAMUSCULAR | Status: DC | PRN

## 2017-03-01 NOTE — Care Management Important Message (Signed)
Important Message  Patient Details  Name: Kara [name] MRN: [id number] Date of Birth: 10-Mar-1951   Medicare Important Message Given:  Yes    Beverly Sessions, RN 03/01/2017, 2:56 PM

## 2017-03-01 NOTE — Evaluation (Signed)
Physical Therapy Evaluation Patient Details Name: Kara [name] MRN: [id number] DOB: 04-18-1951 Today's Date: 03/01/2017   History of Present Illness  Pt is a 66 y.o. female s/p colostomy reversal, appendectomy, lysis adhesion, diagnostic laparoscopy and ILEO loop colostomy secondary to intra-abdominal infection on 02/24/17. Pt has a PHM of diabetes, diverticulitis, HTN; surgical history includes sigmoid colostomy in June 2018.  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 Pt noted desire to return to work when she is able. Pt lives at home with her husband, who works during the day, but she is able to call friends/family nearby for help as needed.  Currently pt has generalized weakness; min assist with rolling, mod I with supine to sit bed mobility, min guard for transfers and walking. Pt able to ambulate 49ft with RW and min guard.  Pt would benefit from skilled PT to address noted impairments and functional limitations (see below for any additional details).  Upon hospital discharge, recommend pt discharge to home health PT.     Follow Up Recommendations Home health PT    Equipment Recommendations  Rolling walker with 5" wheels (Pt has PT recommended DME)    Recommendations for Other Services       Precautions / Restrictions Precautions Precautions: Fall Restrictions Weight Bearing Restrictions: No      Mobility  Bed Mobility Overal bed mobility: Needs Assistance Bed Mobility: Supine to Sit;Rolling Rolling: Min assist   Supine to sit: Modified independent (Device/Increase time);HOB elevated     General bed mobility comments: Increased time and effort to perform both rolling and supine to sit. Required bed rail and min assist for rolling to each side R>L for trunk rotation. Mod  I supine to sit with HOB elevated; pt noted dizziness upon sitting on EOB, resolved within 1 minute  Transfers Overall transfer level: Needs assistance Equipment used: Rolling walker (2 wheeled) Transfers: Sit to/from Stand Sit to Stand: Min guard         General transfer comment: Min guard for safety; no c/o dizziness upon standing  Ambulation/Gait Ambulation/Gait assistance: Min guard Ambulation Distance (Feet): 60 Feet Assistive device: Rolling walker (2 wheeled) Gait Pattern/deviations: Step-through pattern;Decreased stride length     General Gait Details: decreased gait speed; min guard for safety  Stairs            Wheelchair Mobility    Modified Rankin (Stroke Patients Only)       Balance Overall balance assessment: Modified Independent Sitting-balance support: Feet supported Sitting balance-Leahy Scale: Fair Sitting balance - Comments: Slight dizziness upon moving from supine to sitting on EOB; able to sit on EOB with BUE support, no feet support during LE MMT/ROM testing without LOB; able to reach outside of BOS without LOB on R; unable to reach outside of BOS on L secondary to abdominal soreness   Standing balance support: Bilateral upper extremity supported Standing balance-Leahy Scale: Fair Standing balance comment: BUE support on RW during standing                             Pertinent Vitals/Pain Pain Assessment: Faces Faces Pain Scale: Hurts a little bit Pain Location: Abdomen Pain Descriptors / Indicators: Discomfort Pain Intervention(s): Limited activity within patient's tolerance;Monitored during session  Home Living Family/patient expects to be discharged to:: Private residence Living Arrangements: Spouse/significant other Available Help at Discharge: Family;Friend(s) (alone while husband works, friends available by phone prn) Type of Home: House Home Access: Stairs to enter Entrance Stairs-Rails: Can reach both Entrance  Stairs-Number of Steps: [place]: One [place]: [place] - 2 wheels;Walker - 4 wheels;Cane - single point;Shower seat;Toilet riser;Grab bars - toilet      Prior Function Level of Independence: Independent         Comments: Prior to surgery in June, 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 Prior to admission, pt has been independent with ADLs, driving and walking without an AD.     Hand Dominance        Extremity/Trunk Assessment   Upper Extremity Assessment Upper Extremity Assessment: Overall WFL for tasks assessed    Lower Extremity Assessment Lower Extremity Assessment: Generalized weakness (hip flexion R 4/5, L 4-/5; B knee flexion, extension 4/5; B DF, PF 5/5)    Cervical / Trunk Assessment Cervical / Trunk Assessment: Normal  Communication   Communication: No difficulties  Cognition Arousal/Alertness: Awake/alert Behavior During Therapy: WFL for tasks assessed/performed Overall Cognitive Status: Within Functional Limits for tasks assessed                                        General Comments General comments (skin integrity, edema, etc.): Colostomy bag intact throughout session without complication; end of session, bag appeared bloated with gas in it    Exercises     Assessment/Plan    PT Assessment Patient needs continued PT services  PT Problem List Decreased strength;Decreased activity tolerance;Decreased balance;Decreased mobility;Pain       PT Treatment Interventions DME instruction;Stair training;Functional mobility training;Therapeutic activities;Therapeutic exercise;Gait training;Balance training;Patient/family education    PT Goals (Current goals can be found in the Care Plan section)  Acute Rehab PT Goals Patient Stated Goal: to be able to go home PT Goal Formulation: With patient Time For Goal Achievement: 03/15/17 Potential to  Achieve Goals: Good    Frequency Min 2X/week   Barriers to discharge        Co-evaluation               AM-PAC PT "6 Clicks" Daily Activity  Outcome Measure Difficulty turning over in bed (including adjusting bedclothes, sheets and blankets)?: None Difficulty moving from lying on back to sitting on the side of the bed? : A Little Difficulty sitting down on and standing up from a chair with arms (e.g., wheelchair, bedside commode, etc,.)?: A Little Help needed moving to and from a bed to chair (including a wheelchair)?: A Little Help needed walking in hospital room?: A Little Help needed climbing 3-5 steps with a railing? : A Lot 6 Click Score: 18    End of Session Equipment Utilized During Treatment: Gait belt Activity Tolerance: Patient tolerated treatment well Patient left: in chair;with call bell/phone within reach;with chair alarm set;with SCD's reapplied (heels elevated) Nurse Communication: Mobility status PT Visit Diagnosis: Unsteadiness on feet (R26.81);Muscle weakness (generalized) (M62.81)    Time: 3976-7341 PT Time Calculation (min) (ACUTE ONLY): 33 min   Charges:         PT G CodesWetzel Bjornstad, SPT 03/01/2017, 10:52 AM

## 2017-03-01 NOTE — Progress Notes (Signed)
Pt BG was 71 this morning. Spoke to Dr. Rosana Hoes and he suggest to recheck blood sugar again after the pt eating breakfast and if the BG was still low to hold Glipizide. BG was 74 after breakfast. Hold Glipizide Per Dr. Rosana Hoes order.

## 2017-03-01 NOTE — Progress Notes (Signed)
5 Days Post-Op   Subjective:  Patient reports having a much better night last night with the addition of oxycodone. She slept the majority of the night. Tolerating a diet and having improved ostomy output. His only been out of bed to the chair.  Vital signs in last 24 hours: Temp:  [97.8 F (36.6 C)-98.7 F (37.1 C)] 98.1 F (36.7 C) (10/09 0510) Pulse Rate:  [94-99] 95 (10/09 0510) Resp:  [19-30] 22 (10/09 0510) BP: (140-160)/(60-93) 140/60 (10/09 0510) SpO2:  [88 %-96 %] 88 % (10/09 0510) Last BM Date: 02/28/17  Intake/Output from previous day: 10/08 0701 - 10/09 0700 In: 835 [I.V.:806; IV Piggyback:29] Out: 200 [Urine:200]  GI: Abdomen is soft, appropriately tender along her incision sites, nondistended. Midline staple line and left upper quadrant staple line intact without any evidence of erythema or drainage. Loop ileostomy in the right lower quadrant that is pink, patent, productive of bilious fluid.  Lab Results:  CBC  Recent Labs  02/27/17 0341 03/01/17 0508  WBC 11.7* 7.3  HGB 12.6 11.8*  HCT 36.2 33.7*  PLT 220 288   CMP     Component Value Date/Time   NA 140 03/01/2017 0508   NA 139 05/30/2013 0520   K 3.2 (L) 03/01/2017 0508   K 3.6 05/30/2013 0520   CL 104 03/01/2017 0508   CL 108 (H) 05/30/2013 0520   CO2 28 03/01/2017 0508   CO2 27 05/30/2013 0520   GLUCOSE 79 03/01/2017 0508   GLUCOSE 98 05/30/2013 0520   BUN 11 03/01/2017 0508   BUN 11 05/30/2013 0520   CREATININE 0.59 03/01/2017 0508   CREATININE 0.90 05/30/2013 0520   CALCIUM 8.1 (L) 03/01/2017 0508   CALCIUM 7.9 (L) 05/30/2013 0520   PROT 6.5 11/01/2016 0501   PROT 8.2 05/29/2013 0631   ALBUMIN 3.2 (L) 11/01/2016 0501   ALBUMIN 3.8 05/29/2013 0631   AST 32 11/01/2016 0501   AST 33 05/29/2013 0631   ALT 38 11/01/2016 0501   ALT 54 05/29/2013 0631   ALKPHOS 47 11/01/2016 0501   ALKPHOS 64 05/29/2013 0631   BILITOT 0.7 11/01/2016 0501   BILITOT 0.5 05/29/2013 0631   GFRNONAA >60  03/01/2017 0508   GFRNONAA >60 05/30/2013 0520   GFRAA >60 03/01/2017 0508   GFRAA >60 05/30/2013 0520   PT/INR No results for input(s): LABPROT, INR in the last 72 hours.  Studies/Results: Dg Chest Port 1 View  Result Date: 02/27/2017 CLINICAL DATA:  66 year old female with history of chest tightness and breathing difficulty. EXAM: PORTABLE CHEST 1 VIEW COMPARISON:  Chest for 11/17/2013. FINDINGS: Lung volumes are low. Patchy multifocal irregular opacities in lung bases bilaterally (left) which may reflect areas of atelectasis and/or airspace consolidation. Small left pleural effusion. No right pleural effusion. No evidence of pulmonary edema heart size is normal. The patient is rotated to the left on today's exam, resulting in distortion of the mediastinal contours and reduced diagnostic sensitivity and specificity for mediastinal pathology. Aortic atherosclerosis. IMPRESSION: 1. Volumes with bibasilar areas of atelectasis and/or airspace consolidation (left greater than right). 2. Small left pleural effusion. 3. Aortic atherosclerosis. Electronically Signed   By: Vinnie Langton M.D.   On: 02/27/2017 10:44    Assessment/Plan: 66 year old female status post colostomy reversal that was protected by a loop ileostomy. Much better pain control with by mouth oxycodone. Discussed the plan for today of saline locking her IV transitioning her to all oral medications. Physical therapy consult pending for later this morning. Discussed  with the patient that today would be a trial run for discharge home when stable morning. We will endeavor to have appropriate home health and/or physical therapy arranged the time of discharge on Wednesday.   [name], MD [facility]  [facility] [phone] Night [facility] [phone]  03/01/2017

## 2017-03-01 NOTE — Progress Notes (Signed)
Pt has Flu vaccine schedule and she stated she is allergic to latex. Pt has no previous history of vaccine reactions. Spoke to pharmacist and they confirm the flu vaccine is latex free. Also verify with Dr. Rosana Hoes and he said it was okay to give.

## 2017-03-02 LAB — GLUCOSE, CAPILLARY
Glucose-Capillary: 117 mg/dL — ABNORMAL HIGH (ref 65–99)
Glucose-Capillary: 139 mg/dL — ABNORMAL HIGH (ref 65–99)

## 2017-03-02 MED ORDER — OXYCODONE-ACETAMINOPHEN 7.5-325 MG PO TABS: 2.0000 | ORAL_TABLET | Freq: Four times a day (QID) | ORAL | 0 refills | Status: DC | PRN

## 2017-03-02 MED ORDER — AMOXICILLIN-POT CLAVULANATE 875-125 MG PO TABS: 1.0000 | ORAL_TABLET | Freq: Two times a day (BID) | ORAL | 0 refills | Status: DC

## 2017-03-02 NOTE — Progress Notes (Signed)
Kara [name]  A and O x 4. VSS. Pt tolerating diet well. Minimal complaints of pain with meds given to control. No complaints of nausea. IV removed intact, prescriptions given. Pt voiced understanding of discharge instructions with no further questions. Pt given instructions in regard to ileostomy and wound care. Pt discharged via wheelchair with nurse.  Lynann Bologna MSN, RN-BC  Allergies as of 03/02/2017      Reactions   Adhesive [tape] Other (See Comments)   Pt reports, when removing tape from skin most tapes pull her skin off. Ok to use paper tape   Latex    Other Hives   Chlorhexadine wipes/CHG wipes   Morphine And Related Nausea And Vomiting      Medication List    TAKE these medications   amoxicillin-clavulanate 875-125 MG tablet Commonly known as:  AUGMENTIN Take 1 tablet by mouth every 12 (twelve) hours.   aspirin EC 81 MG tablet Take 81 mg by mouth daily.   cyclobenzaprine 10 MG tablet Commonly known as:  FLEXERIL Take 10 mg by mouth daily as needed.   diclofenac 75 MG EC tablet Commonly known as:  VOLTAREN Take 75 mg by mouth daily.   glipiZIDE 5 MG 24 hr tablet Commonly known as:  GLUCOTROL XL 5 mg.   losartan 50 MG tablet Commonly known as:  COZAAR Take 1 tablet by mouth daily.   MULTI-VITAMINS Tabs Take 1 tablet by mouth daily.   mupirocin cream 2 % Commonly known as:  BACTROBAN Apply to nostrils twice daily for five days.   mupirocin nasal ointment 2 % Commonly known as:  BACTROBAN NASAL Place 1 application into the nose 2 (two) times daily. Use one-half of tube in each nostril twice daily for five (5) days. After application, press sides of nose together and gently massage.   oxyCODONE-acetaminophen 7.5-325 MG tablet Commonly known as:  PERCOCET Take 2 tablets by mouth every 6 (six) hours as needed for moderate pain or severe pain.   rivaroxaban 20 MG Tabs tablet Commonly known as:  XARELTO Take by mouth.       Vitals:   03/02/17 1051 03/02/17 1313  BP: 139/71 (!) 148/69  Pulse: 98 (!) 101  Resp: 18   Temp: 98.6 F (37 C) 98.7 F (37.1 C)  SpO2: 92% 92%

## 2017-03-02 NOTE — Discharge Summary (Signed)
Patient ID: Kara [name] MRN: [id number] DOB/AGE: [birth year] 65 y.o.  Admit date: 02/24/2017 Discharge date: 03/02/2017  Discharge Diagnoses:  Ostomy reversal with creation of loop ileostomy  Procedures Performed: Exploratory laparotomy with extensive lysis of adhesions, colostomy reversal with creation of protecting loop ileostomy  Discharged Condition: good  Hospital Course: Patient brought to the hospital for a planned ostomy reversal. Had a prolonged complex surgery. Recovered from well. On the day of discharge her pain was controlled with oral medications and she was tolerating a regular diet with well-controlled abdominal pain.  Discharge Orders:  discharge home.  Disposition: 01-Home or Self Care  Discharge Medications: Allergies as of 03/02/2017      Reactions   Adhesive [tape] Other (See Comments)   Pt reports, when removing tape from skin most tapes pull her skin off. Ok to use paper tape   Latex    Other Hives   Chlorhexadine wipes/CHG wipes   Morphine And Related Nausea And Vomiting      Medication List    TAKE these medications   amoxicillin-clavulanate 875-125 MG tablet Commonly known as:  AUGMENTIN Take 1 tablet by mouth every 12 (twelve) hours.   aspirin EC 81 MG tablet Take 81 mg by mouth daily.   cyclobenzaprine 10 MG tablet Commonly known as:  FLEXERIL Take 10 mg by mouth daily as needed.   diclofenac 75 MG EC tablet Commonly known as:  VOLTAREN Take 75 mg by mouth daily.   glipiZIDE 5 MG 24 hr tablet Commonly known as:  GLUCOTROL XL 5 mg.   losartan 50 MG tablet Commonly known as:  COZAAR Take 1 tablet by mouth daily.   MULTI-VITAMINS Tabs Take 1 tablet by mouth daily.   mupirocin cream 2 % Commonly known as:  BACTROBAN Apply to nostrils twice daily for five days.   mupirocin nasal ointment 2 % Commonly known as:  BACTROBAN NASAL Place 1 application into the nose 2 (two) times daily. Use one-half of tube in each  nostril twice daily for five (5) days. After application, press sides of nose together and gently massage.   oxyCODONE-acetaminophen 7.5-325 MG tablet Commonly known as:  PERCOCET Take 2 tablets by mouth every 6 (six) hours as needed for moderate pain or severe pain.   rivaroxaban 20 MG Tabs tablet Commonly known as:  XARELTO Take by mouth.        Follwup: Follow-up Information    [name], MD. Go in 1 week(s).   Specialty:  General Surgery Why:  Report clinic at10:45 for an 11:00am appointment 10/17 Contact information: [place] [address] [phone]           Signed: [name] 03/02/2017, 6:59 AM

## 2017-03-02 NOTE — Care Management (Signed)
Patient to discharge home with home health services RN and PT.  Notified Christie with [facility] of discharge.  Confrimed with [facility] RN that patient does not need to be enrolled in the [employer] program in the past, and was provided with the number to continue placing her orders.  Patient was provided with Supplies, wafers, pouches, and barrier rings, 5 of each item in bag with measuring device and template for home use by the [facility] RN. RNCM signing off.

## 2017-03-02 NOTE — Consult Note (Signed)
[facility] Nurse ostomy follow up Stoma type/location: RLQ ileostomy Stomal assessment/size: oval shaped, see Mondays measurements, pink and moist. Plastic retention rod in place.   Peristomal assessment: Intact.   Treatment options for stomal/peristomal skin: Barrier ring and 2 piece system.  Output 250cc dark green liquid  Ostomy pouching: 2 piece, flat with barrier ring  Education provided:Patient states she has had an ostomy before and feel comfortable working with this one. She was able to verbalize how to work around the stabilizer rods.  Enrolled patient in [facility] Discharge program: No Supplies, wafers, pouches, and barrier rings, 5 of each item in bag with measuring device and template for home use. Pt is to be discharged today after CM gets [place] arranged. Will check later in week and make sure pt was indeed discharged.  Fara Olden, RN-C, WTA-C, [facility]

## 2017-03-04 ENCOUNTER — Other Ambulatory Visit: Payer: Self-pay

## 2017-03-04 DIAGNOSIS — I10 Essential (primary) hypertension: Secondary | ICD-10-CM | POA: Diagnosis not present

## 2017-03-04 DIAGNOSIS — Z933 Colostomy status: Secondary | ICD-10-CM | POA: Diagnosis not present

## 2017-03-04 DIAGNOSIS — R262 Difficulty in walking, not elsewhere classified: Secondary | ICD-10-CM | POA: Diagnosis not present

## 2017-03-04 DIAGNOSIS — T82539A Leakage of unspecified cardiac and vascular devices and implants, initial encounter: Secondary | ICD-10-CM | POA: Diagnosis not present

## 2017-03-05 ENCOUNTER — Emergency Department: Payer: PPO

## 2017-03-05 ENCOUNTER — Emergency Department
Admission: EM | Admit: 2017-03-05 | Discharge: 2017-03-05 | Disposition: A | Discharge status: Home / Self Care | Payer: PPO | Attending: Emergency Medicine | Admitting: Emergency Medicine

## 2017-03-05 DIAGNOSIS — I10 Essential (primary) hypertension: Secondary | ICD-10-CM | POA: Insufficient documentation

## 2017-03-05 DIAGNOSIS — E119 Type 2 diabetes mellitus without complications: Secondary | ICD-10-CM | POA: Insufficient documentation

## 2017-03-05 DIAGNOSIS — Z91040 Latex allergy status: Secondary | ICD-10-CM | POA: Insufficient documentation

## 2017-03-05 DIAGNOSIS — R109 Unspecified abdominal pain: Secondary | ICD-10-CM | POA: Diagnosis not present

## 2017-03-05 DIAGNOSIS — Z79899 Other long term (current) drug therapy: Secondary | ICD-10-CM | POA: Diagnosis not present

## 2017-03-05 DIAGNOSIS — Z794 Long term (current) use of insulin: Secondary | ICD-10-CM | POA: Insufficient documentation

## 2017-03-05 DIAGNOSIS — K9413 Enterostomy malfunction: Secondary | ICD-10-CM | POA: Diagnosis not present

## 2017-03-05 DIAGNOSIS — Z7189 Other specified counseling: Secondary | ICD-10-CM

## 2017-03-05 DIAGNOSIS — R1011 Right upper quadrant pain: Secondary | ICD-10-CM | POA: Diagnosis not present

## 2017-03-05 DIAGNOSIS — K9400 Colostomy complication, unspecified: Secondary | ICD-10-CM | POA: Diagnosis not present

## 2017-03-05 LAB — COMPREHENSIVE METABOLIC PANEL
ALT: 34 U/L (ref 14–54)
ANION GAP: 11 (ref 5–15)
AST: 28 U/L (ref 15–41)
Albumin: 3.2 g/dL — ABNORMAL LOW (ref 3.5–5.0)
Alkaline Phosphatase: 53 U/L (ref 38–126)
BILIRUBIN TOTAL: 0.5 mg/dL (ref 0.3–1.2)
BUN: 7 mg/dL (ref 6–20)
CHLORIDE: 102 mmol/L (ref 101–111)
CO2: 28 mmol/L (ref 22–32)
Calcium: 9 mg/dL (ref 8.9–10.3)
Creatinine, Ser: 0.62 mg/dL (ref 0.44–1.00)
GFR calc Af Amer: >60 mL/min (ref >60)
Glucose, Bld: 157 mg/dL — ABNORMAL HIGH (ref 65–99)
POTASSIUM: 3.1 mmol/L — AB (ref 3.5–5.1)
Sodium: 141 mmol/L (ref 135–145)
TOTAL PROTEIN: 7 g/dL (ref 6.5–8.1)

## 2017-03-05 LAB — URINALYSIS, COMPLETE (UACMP) WITH MICROSCOPIC
Bilirubin Urine: NEGATIVE
Glucose, UA: NEGATIVE mg/dL
Hgb urine dipstick: NEGATIVE
Ketones, ur: 20 mg/dL — AB
Leukocytes, UA: NEGATIVE
Nitrite: NEGATIVE
Protein, ur: NEGATIVE mg/dL
SPECIFIC GRAVITY, URINE: 1.017 (ref 1.005–1.030)
pH: 7 (ref 5.0–8.0)

## 2017-03-05 LAB — CBC WITH DIFFERENTIAL/PLATELET
Basophils Absolute: 0.1 10*3/uL (ref 0–0.1)
Basophils Relative: 1 %
EOS ABS: 0.2 10*3/uL (ref 0–0.7)
Eosinophils Relative: 2 %
HCT: 36.8 % (ref 35.0–47.0)
HEMOGLOBIN: 12.7 g/dL (ref 12.0–16.0)
LYMPHS ABS: 1.2 10*3/uL (ref 1.0–3.6)
Lymphocytes Relative: 14 %
MCH: 30.5 pg (ref 26.0–34.0)
MCHC: 34.5 g/dL (ref 32.0–36.0)
MCV: 88.3 fL (ref 80.0–100.0)
Monocytes Absolute: 0.7 10*3/uL (ref 0.2–0.9)
Monocytes Relative: 9 %
NEUTROS ABS: 6 10*3/uL (ref 1.4–6.5)
NEUTROS PCT: 74 %
PLATELETS: 359 10*3/uL (ref 150–440)
RBC: 4.16 MIL/uL (ref 3.80–5.20)
RDW: 14 % (ref 11.5–14.5)
WBC: 8.1 10*3/uL (ref 3.6–11.0)

## 2017-03-05 MED ORDER — IOPAMIDOL (ISOVUE-300) INJECTION 61%
30.0000 mL | Freq: Once | INTRAVENOUS | Status: AC | PRN
  Administered 2017-03-05: 30 mL via ORAL

## 2017-03-05 MED ORDER — IOPAMIDOL (ISOVUE-300) INJECTION 61%
100.0000 mL | Freq: Once | INTRAVENOUS | Status: AC | PRN
  Administered 2017-03-05: 100 mL via INTRAVENOUS

## 2017-03-05 NOTE — Progress Notes (Signed)
She came to the emergency room by ambulance because her ileostomy wafer was leaking. She had recent laparotomy with bowel resection and closure of colostomy. She also had a diverting loop ileostomy on October 4. She reports she has been eating satisfactorily and voiding satisfactorily and having minimal abdominal discomfort.  She initially was evaluated by the emergency room staff and did have a CT scan of the abdomen and pelvis. This did demonstrate 5.7 cm collection of fluid within the pelvis. Cholelithiasis was noted.  Blood pressure 142/63, pulse rate 100, respiratory rate 20, oxygen saturation 95% on room air  On examination she was seen on the emergency room stretcher and was in some distress due to bile running out from under the wafer and down onto the sheets of the bed. The midline abdominal incision containing clips and a drain at the lower end appears to be healing satisfactorily. The colostomy closure site with clips and drain appears to be healing satisfactorily. The abdomen is  with morbid obesity and is soft with no significant tenderness.  The bag was removed from the ileostomy. There was some moderate degree of dermatitis at the site. The loop ileostomy was examined and appeared to have a healthy mucosa. Bile came out several times during the course of the examination.  Impression: dermatitis surrounding the loop ileostomy with failure of the wafer to stick adequately.  I applied tincture of benzoin to the skin to help make it sticky and also applied a new wafer and a new bag. At present this appears to be sticking satisfactorily.  I gave her instructions to empty the bag frequently and also appliy tincture of benzoin to the skin each time the wafer is changed. She does have home health care nursing. She does have an appointment with Dr. Adonis Huguenin for next week. This was discussed with Dr.Schaevitz the emergency room physician.

## 2017-03-05 NOTE — ED Provider Notes (Signed)
[facility] Emergency Department Provider Note  ____________________________________________   First MD Initiated Contact with Patient 03/05/17 [phone]     (approximate)  I have reviewed the triage vital signs and the nursing notes.   HISTORY  Chief Complaint Abdominal Pain   HPI Tran Randle is a 66 y.o. female who is status postcolostomy reversal on October 4with subsequent protecting loop ileostomy who is presenting to the emergency department today with leakage at the ileostomy site. She says that she has had decreasing pain since the surgery. Denies fever, nausea or vomiting. However, she does report that she is unable to fix her ostomy bag in therefore there has been leakage of stool on her abdomen. She denies any pain at the incision sites including left upper quadrant as well as the midline incision. She says that she also had a "bridge" is expelled from her ostomy that is well. No blood in ostomy bag. Brown, liquid-like stool expelled.   Past Medical History:  Diagnosis Date  . 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 Diabetes ([place])   . Diverticulitis   . Hypertension   . 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 History of colostomy reversal 02/24/2017  . Other pulmonary embolism without acute cor pulmonale ([place]) 11/23/2016  . Acute on chronic systolic (congestive) heart failure ([place]) 11/18/2016  . HCAP (healthcare-associated pneumonia) 11/18/2016  . Encounter for removal of staples   . Colostomy in place [facility]) 11/15/2016  . Edema of both legs 11/15/2016  . Morbidly obese ([place]) 11/15/2016  . Diverticulitis 10/03/2016  . Diverticulitis large intestine 09/29/2016  . Benign essential hypertension 08/26/2016  . DDD (degenerative disc disease), lumbar 07/14/2016  . Acute non-recurrent maxillary sinusitis 05/21/2016  . Acute hip pain, left  02/23/2016  . Chronic low back pain without sciatica 11/14/2015  . Cough 07/30/2015  . Nocturnal leg cramps 02/20/2015  . Diabetes mellitus type 2 in obese ([place]) 11/12/2014  . Type 2 diabetes mellitus without complication, without long-term current use of insulin ([place]) 11/12/2014  . BP (high blood pressure) 11/12/2014    Past Surgical History:  Procedure Laterality Date  . APPENDECTOMY N/A 02/24/2017   Procedure: Incidental  APPENDECTOMY;  Surgeon: [name], MD;  Location: [name] ORS;  Service: General;  Laterality: N/A;  . CARPAL TUNNEL RELEASE Bilateral   . COLON RESECTION SIGMOID N/A 11/02/2016   Procedure: COLON RESECTION SIGMOID;  Surgeon: [name], MD;  Location: [name] ORS;  Service: General;  Laterality: N/A;  . COLON SURGERY  11/02/2016  . COLONOSCOPY WITH PROPOFOL N/A 02/03/2017   Procedure: COLONOSCOPY WITH PROPOFOL;  Surgeon: Lin Landsman, MD;  Location: [facility] ENDOSCOPY;  Service: Gastroenterology;  Laterality: N/A;  . COLOSTOMY N/A 11/02/2016   Procedure: COLOSTOMY;  Surgeon: [name], MD;  Location: [name] ORS;  Service: General;  Laterality: N/A;  . COLOSTOMY REVERSAL N/A 02/24/2017   Procedure: COLOSTOMY REVERSAL, ostomy takedown, spleenic flexure mobilization, excision rectal stump/distal sigmoid, anastomosis with suture reinforcement;  Surgeon: [name], MD;  Location: [name] ORS;  Service: General;  Laterality: N/A;  . ILEO LOOP DIVERSION N/A 02/24/2017   Procedure: ILEO LOOP COLOSTOMY;  Surgeon: [name], MD;  Location: [name] ORS;  Service: General;  Laterality: N/A;  . IVC FILTER INSERTION Right 10/2016  . KNEE SURGERY     torn menicus  . LAPAROSCOPY N/A 02/24/2017   Procedure:  LAPAROSCOPY DIAGNOSTIC;  Surgeon: [name], MD;  Location: [name] ORS;  Service: General;  Laterality: N/A;  . LYSIS OF ADHESION N/A 02/24/2017   Procedure: LYSIS OF ADHESION;  Surgeon: [name], MD;  Location: [name] ORS;  Service: General;   Laterality: N/A;  . PULMONARY VENOGRAPHY N/A 11/19/2016   Procedure: Pulmonary Venography; IVC filter placement; possible pulmonary thrombectomy;  Surgeon: Katha Cabal, MD;  Location: [facility] CV LAB;  Service: Cardiovascular;  Laterality: N/A;  . SIGMOIDOSCOPY N/A 11/13/2016   Procedure: endoscopic  flexible SIGMOIDOSCOPY;  Surgeon: Florene Glen, MD;  Location: [name] ORS;  Service: General;  Laterality: N/A;    Prior to Admission medications   Medication Sig Start Date End Date Taking? Authorizing Provider  amoxicillin-clavulanate (AUGMENTIN) 875-125 MG tablet Take 1 tablet by mouth every 12 (twelve) hours. 03/02/17   [name], MD  aspirin EC 81 MG tablet Take 81 mg by mouth daily.    [provider]  cyclobenzaprine (FLEXERIL) 10 MG tablet Take 10 mg by mouth daily as needed. 07/05/16   [provider]  diclofenac (VOLTAREN) 75 MG EC tablet Take 75 mg by mouth daily. 07/05/16   [provider]  glipiZIDE (GLUCOTROL XL) 2.5 MG 24 hr tablet  12/25/16   [provider]  glipiZIDE (GLUCOTROL XL) 5 MG 24 hr tablet 5 mg.  10/27/16   [provider]  losartan (COZAAR) 50 MG tablet Take 1 tablet by mouth daily. 11/02/16 11/02/17  [provider]  Multiple Vitamin (MULTI-VITAMINS) TABS Take 1 tablet by mouth daily.    [provider]  mupirocin cream (BACTROBAN) 2 % Apply to nostrils twice daily for five days. Patient not taking: Reported on 02/25/2017 02/18/17 02/18/18  [name], MD  mupirocin nasal ointment (BACTROBAN NASAL) 2 % Place 1 application into the nose 2 (two) times daily. Use one-half of tube in each nostril twice daily for five (5) days. After application, press sides of nose together and gently massage. Patient not taking: Reported on 02/25/2017 02/21/17 02/26/17  [name], MD  oxyCODONE-acetaminophen (PERCOCET) 7.5-325 MG tablet Take 2 tablets by mouth every 6 (six) hours as needed for moderate pain  or severe pain. 03/02/17   [name], MD  rivaroxaban Alveda Reasons) 20 MG TABS tablet Take by mouth. 12/09/16   [provider]    Allergies Adhesive [tape]; Latex; Other; and Morphine and related  Family History  Problem Relation Age of Onset  . Diabetes Mother   . Heart disease Mother   . Cancer Mother   . Heart disease Father   . Diabetes Sister   . Heart disease Sister     Social History Social History  Substance Use Topics  . Smoking status: Never Smoker  . Smokeless tobacco: Never Used  . Alcohol use No    Review of Systems  Constitutional: No fever/chills Eyes: No visual changes. ENT: No sore throat. Cardiovascular: Denies chest pain. Respiratory: Denies shortness of breath. Gastrointestinal:  No nausea, no vomiting.  No diarrhea.  No constipation. Genitourinary: Negative for dysuria. Musculoskeletal: Negative for back pain. Skin: Negative for rash. Neurological: Negative for headaches, focal weakness or numbness.   ____________________________________________   PHYSICAL EXAM:  VITAL SIGNS: ED Triage Vitals [03/05/17 0117]  Enc Vitals Group     BP 131/77     Pulse Rate 100     Resp 20     Temp 97.9 F (36.6 C)     Temp src      SpO2  95 %     Weight 215 lb (97.5 kg)     Height 5\' 3"  (1.6 m)     Head Circumference      Peak Flow      Pain Score 10     Pain Loc      Pain Edu?      Excl. in [place]?     Constitutional: Alert and oriented. Well appearing and in no acute distress. Eyes: Conjunctivae are normal.  Head: Atraumatic. Nose: No congestion/rhinnorhea. Mouth/Throat: Mucous membranes are moist.  Neck: No stridor.   Cardiovascular: Normal rate, regular rhythm. Grossly normal heart sounds.  Good peripheral circulation. Respiratory: Normal respiratory effort.  No retractions. Lungs CTAB. Gastrointestinal: Soft with minimal retinal quadrant tenderness palpation without rebound or guarding. Patient says that she has had this pain with  palpation since having her surgery and has not increased. No distention.  right lower quadrant ostomy site with poor seal of the ostomy bag with leakage of ostomy contents. No surrounding erythema or induration. There is no tenderness palpation over the site.  Musculoskeletal: No lower extremity tenderness nor edema.  No joint effusions. Neurologic:  Normal speech and language. No gross focal neurologic deficits are appreciated. Skin:  Skin is warm, dry and intact. No rash noted. Psychiatric: Mood and affect are normal. Speech and behavior are normal.  ____________________________________________   LABS (all labs ordered are listed, but only abnormal results are displayed)  Labs Reviewed  COMPREHENSIVE METABOLIC PANEL - Abnormal; Notable for the following:       Result Value   Potassium 3.1 (*)    Glucose, Bld 157 (*)    Albumin 3.2 (*)    All other components within normal limits  URINALYSIS, COMPLETE (UACMP) WITH MICROSCOPIC - Abnormal; Notable for the following:    Color, Urine YELLOW (*)    APPearance HAZY (*)    Ketones, ur 20 (*)    Bacteria, UA RARE (*)    Squamous Epithelial / LPF 0-5 (*)    All other components within normal limits  CBC WITH DIFFERENTIAL/PLATELET   ____________________________________________  EKG   ____________________________________________  RADIOLOGY   IMPRESSION: 1. Mild soft tissue inflammation about the ileum raises concern for ileitis, at the left mid abdomen. 2. Mild mesenteric inflammation at the patient's ileostomy; no evidence of obstruction. Mild omental inflammation noted. This may reflect underlying infection. 3. Mild peripheral enhancement at the 5.7 cm collection of fluid within the pelvis, raising question for infection. No well defined abscess is yet seen. 4. Trace fluid noted underlying the surgical sites at the midline and left mid abdomen, with associated soft tissue inflammation. These likely reflect trace  postoperative seromas. 5. Trace left-sided pleural fluid. Mild left basilar airspace opacity may reflect atelectasis or possibly mild pneumonia. 6. Cholelithiasis. Gallbladder otherwise unremarkable. 7. Trace ascites noted tracking about the liver. 8. 6 mm nonspecific hypodensity at the right hepatic lobe. 9. Scattered aortic atherosclerosis. 10. 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 Grade 1 anterolisthesis of L4 on L5.   Electronically Signed By: Garald Balding M.D. On: 03/05/2017 06:00            ____________________________________________   PROCEDURES  Procedure(s) performed:   Procedures  Critical Care performed:   ____________________________________________   INITIAL IMPRESSION / ASSESSMENT AND PLAN / ED COURSE  Pertinent labs & imaging results that were available during my care of the patient were reviewed by me and considered in my medical decision making (see chart for details).  Differential diagnosis includes, but is not limited to, ovarian cyst, ovarian torsion, acute appendicitis, diverticulitis, urinary tract infection/pyelonephritis, endometriosis, bowel obstruction, colitis, renal colic, gastroenteritis, hernia, pregnancy related pain including ectopic pregnancy, ostomy bag malfunction  As part of my medical decision making, I reviewed the following data within the [place] chart reviewed  Patient with possible equipment malfunction of her ostomy bag. However, the CAT scan is concerning for inflammatory/infection. However, the patient's exam is very benign and she is normally white blood cell count. Denies fever and does not have fever here in the emergency department. I discussed case with Dr. Tamala Julian of surgery who will be evaluating the patient.  ----------------------------------------- 10:25 AM on 03/05/2017 -----------------------------------------  Patient resting comfortable at this time. No colitis of pain.  The ostomy bag was secured with benzoin by Dr. Tamala Julian who approves the patient for discharge to home. there is no leakage at this time.The patient has appointment with her surgeon this coming Wednesday. She'll be discharged with benzoin, if available. However, Dr. Tamala Julian says that the ostomy should hold now for several days. The patient has home health that is coming Tuesday to help her complete ostomy care. She is understanding the plan for discharge and wanted to comply.        ____________________________________________   FINAL CLINICAL IMPRESSION(S) / ED DIAGNOSES  ostomy malfunction.    NEW MEDICATIONS STARTED DURING THIS VISIT:  New Prescriptions   No medications on file     Note:  This document was prepared using Dragon voice recognition software and may include unintentional dictation errors.     Orbie Pyo, MD 03/05/17 1025

## 2017-03-05 NOTE — ED Notes (Signed)
CT tech notified that patient has finished contrast.

## 2017-03-05 NOTE — Discharge Instructions (Addendum)
Empty the ileostomy back frequently.  Each time the wafer is changed apply tincture of benzoin to the skin and allow to dry to help the wafer stick.  Tincture of benzoin can be purchased over-the-counter at a pharmacy.

## 2017-03-05 NOTE — ED Triage Notes (Signed)
Pt states surgical wounds to abd are leaking. Pt has brown drainage noted to dressings. Pt states has generlazed abd pain, no fever, no vomiting. Noted wound to left lower quadrant, right lower quadrant with drainage.

## 2017-03-07 ENCOUNTER — Encounter: Payer: Self-pay | Admitting: General Surgery

## 2017-03-07 ENCOUNTER — Ambulatory Visit (INDEPENDENT_AMBULATORY_CARE_PROVIDER_SITE_OTHER): Payer: PPO | Admitting: General Surgery

## 2017-03-07 VITALS — BP 119/83 | HR 108 | Temp 97.8°F | Ht 63.0 in | Wt 216.0 lb

## 2017-03-07 DIAGNOSIS — Z4889 Encounter for other specified surgical aftercare: Secondary | ICD-10-CM

## 2017-03-07 NOTE — Patient Instructions (Signed)
Kara [name] has sent an email to Kara [name] the Ostomy nurse. Once we hear from [name] we will schedule an appointment for you to see her regarding the issues with your bag.  Please call our office with any questions or concerns.

## 2017-03-07 NOTE — Progress Notes (Signed)
Outpatient Surgical Follow Up  03/07/2017  Kara [name] is an 66 y.o. female.   Chief Complaint  Patient presents with  . Routine Post Op    Diagnostic laparoscopy, lysis of adhesions, appendectomy, mobilization of splenic flexure, resection of distal sigmoid/rectal stump, stapled anastomosis followed by suture repair of colorectal anastomosis, loop ileostomy creation 02/24/17 Dr. Adonis Huguenin    HPI: 66 year old female returns to clinic for follow up 11 days s/p colostomy reversal with loop ileostomy creation. Her surgery was complicated by an anastomotic leak which prompted the loop ileostomy. Patient was seen in the ED over the weekend secondary to her pouch leaking and her not being able to obtain an adequate seal of her pouch. She reports that the bag placed in the ED kept a seal for about 10 minutes. She otherwise states she is eating well and that her pain is minimal and well controlled. She denies any fevers, chills, nausea, vomiting but is very frustrated with her ostomy appliances. The bridge that was under her loop came out over the weekend and she found it in her bag. She reports irritation from the bile and benzoin that was used in the ED.  Past Medical History:  Diagnosis Date  . 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 Diabetes ([place])   . Diverticulitis   . Hypertension   . 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 APPENDECTOMY N/A 02/24/2017   Procedure: Incidental  APPENDECTOMY;  Surgeon: [name], MD;  Location: [name] ORS;  Service: General;  Laterality: N/A;  . CARPAL TUNNEL RELEASE Bilateral   . COLON RESECTION SIGMOID N/A 11/02/2016   Procedure: COLON RESECTION SIGMOID;  Surgeon: [name], MD;  Location: [name] ORS;  Service: General;  Laterality: N/A;  . COLON SURGERY  11/02/2016  . COLONOSCOPY WITH PROPOFOL N/A 02/03/2017   Procedure: COLONOSCOPY WITH PROPOFOL;   Surgeon: Lin Landsman, MD;  Location: [facility] ENDOSCOPY;  Service: Gastroenterology;  Laterality: N/A;  . COLOSTOMY N/A 11/02/2016   Procedure: COLOSTOMY;  Surgeon: [name], MD;  Location: [name] ORS;  Service: General;  Laterality: N/A;  . COLOSTOMY REVERSAL N/A 02/24/2017   Procedure: COLOSTOMY REVERSAL, ostomy takedown, spleenic flexure mobilization, excision rectal stump/distal sigmoid, anastomosis with suture reinforcement;  Surgeon: [name], MD;  Location: [name] ORS;  Service: General;  Laterality: N/A;  . ILEO LOOP DIVERSION N/A 02/24/2017   Procedure: ILEO LOOP COLOSTOMY;  Surgeon: [name], MD;  Location: [name] ORS;  Service: General;  Laterality: N/A;  . IVC FILTER INSERTION Right 10/2016  . KNEE SURGERY     torn menicus  . LAPAROSCOPY N/A 02/24/2017   Procedure: LAPAROSCOPY DIAGNOSTIC;  Surgeon: [name], MD;  Location: [name] ORS;  Service: General;  Laterality: N/A;  . LYSIS OF ADHESION N/A 02/24/2017   Procedure: LYSIS OF ADHESION;  Surgeon: [name], MD;  Location: [name] ORS;  Service: General;  Laterality: N/A;  . PULMONARY VENOGRAPHY N/A 11/19/2016   Procedure: Pulmonary Venography; IVC filter placement; possible pulmonary thrombectomy;  Surgeon: Katha Cabal, MD;  Location: [place] CV LAB;  Service: Cardiovascular;  Laterality: N/A;  . SIGMOIDOSCOPY N/A 11/13/2016   Procedure: endoscopic  flexible SIGMOIDOSCOPY;  Surgeon: Florene Glen, MD;  Location: [name] ORS;  Service: General;  Laterality: N/A;    Family History  Problem Relation Age of Onset  . Diabetes Mother   . Heart disease  Mother   . Cancer Mother   . Heart disease Father   . Diabetes Sister   . Heart disease Sister     Social History:  reports that she has never smoked. She has never used smokeless tobacco. She reports that she does not drink alcohol or use drugs.  Allergies:  Allergies  Allergen Reactions  . Adhesive [Tape] Other (See Comments)    Pt  reports, when removing tape from skin most tapes pull her skin off. Ok to use paper tape  . Morphine Nausea Only  . Other Hives    Chlorhexadine wipes/CHG wipes  . Latex Rash  . Morphine And Related Nausea And Vomiting    Medications reviewed.    ROS A multipoint ROS was completed and all pertinent positives and negatives are documented in the HPI and the remainder are negative   BP 119/83   Pulse (!) 108   Temp 97.8 F (36.6 C) (Oral)   Ht 5\' 3"  (1.6 m)   Wt 98 kg (216 lb)   BMI 38.26 kg/m   Physical Exam Gen: NAD Resp: CTA CV: Tachycardic Abd: Soft, appropriately ttp at her incision sites and nondistended. Midline and left upper quadrant staple lines are intact without evidence of erythema. Drains remain in place under the staple lines. Loop ileostomy is in the right lower quadrant and appears healthy with bilious output. Currently without obvious drainage around appliance.     No results found for this or any previous visit (from the past 48 hour(s)). No results found.  Assessment/Plan:  1. Aftercare following surgery 66 year old female s/p ostomy reversal with loop ileostomy creation. Incisional drains removed from under both staple lines today. Discussed techniques for keeping a good seal and discussed the importance of maintaining hydration. Patient will follow up in 2 days for an additional wound check now that the drains have been removed.     [name], MD FACS General Surgeon  03/07/2017,7:18 PM

## 2017-03-07 NOTE — Consult Note (Signed)
[place] Nurse ostomy follow up Patient has been discharged with RLQ Ileostomy and having pouching issues/leakage. She does have home health services.   I have called and left a message on the home phone.  I am available to see her Friday WOC team continues to follow as needed.  Domenic Moras RN BSN [place] Pager [phone]

## 2017-03-08 ENCOUNTER — Telehealth: Payer: Self-pay | Admitting: General Surgery

## 2017-03-08 NOTE — Telephone Encounter (Signed)
Contacted [place] via e-mail. Awaiting for her response so I could call the patient with answers.

## 2017-03-08 NOTE — Telephone Encounter (Signed)
Patient states that Herb Grays has called and advised her of her appointment with Dr Adonis Huguenin tomorrow (03/09/17). Patient did receive the message. She still has questions about ostomy supplies and would like to know if Domenic Moras has returned the message that was sent by email.

## 2017-03-09 ENCOUNTER — Telehealth: Payer: Self-pay

## 2017-03-09 ENCOUNTER — Encounter: Payer: PPO | Admitting: General Surgery

## 2017-03-09 ENCOUNTER — Ambulatory Visit (INDEPENDENT_AMBULATORY_CARE_PROVIDER_SITE_OTHER): Payer: PPO | Admitting: General Surgery

## 2017-03-09 ENCOUNTER — Encounter: Payer: Self-pay | Admitting: General Surgery

## 2017-03-09 ENCOUNTER — Telehealth: Payer: Self-pay | Admitting: General Surgery

## 2017-03-09 VITALS — BP 139/65 | HR 105 | Temp 97.7°F | Ht 63.0 in | Wt 216.0 lb

## 2017-03-09 DIAGNOSIS — Z4889 Encounter for other specified surgical aftercare: Secondary | ICD-10-CM

## 2017-03-09 MED ORDER — OXYCODONE-ACETAMINOPHEN 7.5-325 MG PO TABS: 2.0000 | ORAL_TABLET | Freq: Four times a day (QID) | ORAL | 0 refills | Status: DC | PRN

## 2017-03-09 NOTE — Telephone Encounter (Signed)
Patient called at this time. She just wanted to ler Dr. Adonis Huguenin know that her Colostomy bag is still leaking.  We discussed [name] coming on Friday and that hopefully she will have a solution to the leaking issues.

## 2017-03-09 NOTE — Patient Instructions (Signed)
We have changed your ostomy bag today. However if you have any leaks or drainage from this please give our office a call. Today we are also sending you home with a new prescription and advised you to finish your antibiotics.  We have scheduled you a nurse visit appointment on Friday 10/19 with Domenic Moras. Once we hear from her we will let you know the exact time.   We also have scheduled your follow up appointments as well. If these need to be changed give our office a call.

## 2017-03-09 NOTE — Telephone Encounter (Signed)
Patient called and left a message at 12:15pm, said she will be meeting Kara [name] here in our office at 11:00am on Friday to pick up her supplies.

## 2017-03-09 NOTE — Progress Notes (Signed)
Outpatient Surgical Follow Up  03/09/2017  Kara [name] is an 66 y.o. female.   Chief Complaint  Patient presents with  . Routine Post Op    Diagnostic laparoscopy, lysis of adhesions, appendectomy, mobilization of splenic flexure, resection of distal sigmoid/rectal stump, stapled anastomosis followed by suture repair of colorectal anastomosis, loop ileostomy creation 02/24/17 Dr. Adonis Huguenin    HPI: 66 year old female returns to clinic for follow-up status post ostomy reversal with creation of loop ileostomy. Continues to have areas of leaking from around the ostomy appliance. Also with significant soreness and pain to the ostomy area. She states she is tolerating a diet and denies any fevers, chills, nausea or vomiting, chest pain, shortness of breath. Her only complaint is of the leaking from the ostomy appliance.  Past Medical History:  Diagnosis Date  . 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 Diabetes ([place])   . Diverticulitis   . Hypertension   . 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 APPENDECTOMY N/A 02/24/2017   Procedure: Incidental  APPENDECTOMY;  Surgeon: [name], MD;  Location: [name] ORS;  Service: General;  Laterality: N/A;  . CARPAL TUNNEL RELEASE Bilateral   . COLON RESECTION SIGMOID N/A 11/02/2016   Procedure: COLON RESECTION SIGMOID;  Surgeon: [name], MD;  Location: [name] ORS;  Service: General;  Laterality: N/A;  . COLON SURGERY  11/02/2016  . COLONOSCOPY WITH PROPOFOL N/A 02/03/2017   Procedure: COLONOSCOPY WITH PROPOFOL;  Surgeon: Lin Landsman, MD;  Location: [facility] ENDOSCOPY;  Service: Gastroenterology;  Laterality: N/A;  . COLOSTOMY N/A 11/02/2016   Procedure: COLOSTOMY;  Surgeon: [name], MD;  Location: [name] ORS;  Service: General;  Laterality: N/A;  . COLOSTOMY REVERSAL N/A 02/24/2017   Procedure: COLOSTOMY REVERSAL, ostomy takedown,  spleenic flexure mobilization, excision rectal stump/distal sigmoid, anastomosis with suture reinforcement;  Surgeon: [name], MD;  Location: [name] ORS;  Service: General;  Laterality: N/A;  . ILEO LOOP DIVERSION N/A 02/24/2017   Procedure: ILEO LOOP COLOSTOMY;  Surgeon: [name], MD;  Location: [name] ORS;  Service: General;  Laterality: N/A;  . IVC FILTER INSERTION Right 10/2016  . KNEE SURGERY     torn menicus  . LAPAROSCOPY N/A 02/24/2017   Procedure: LAPAROSCOPY DIAGNOSTIC;  Surgeon: [name], MD;  Location: [name] ORS;  Service: General;  Laterality: N/A;  . LYSIS OF ADHESION N/A 02/24/2017   Procedure: LYSIS OF ADHESION;  Surgeon: [name], MD;  Location: [name] ORS;  Service: General;  Laterality: N/A;  . PULMONARY VENOGRAPHY N/A 11/19/2016   Procedure: Pulmonary Venography; IVC filter placement; possible pulmonary thrombectomy;  Surgeon: Katha Cabal, MD;  Location: [place] CV LAB;  Service: Cardiovascular;  Laterality: N/A;  . SIGMOIDOSCOPY N/A 11/13/2016   Procedure: endoscopic  flexible SIGMOIDOSCOPY;  Surgeon: Florene Glen, MD;  Location: [name] ORS;  Service: General;  Laterality: N/A;    Family History  Problem Relation Age of Onset  . Diabetes Mother   . Heart disease Mother   . Cancer Mother   . Heart disease Father   . Diabetes Sister   . Heart disease Sister     Social History:  reports that she has never smoked. She has never used smokeless tobacco. She reports that she does not drink alcohol or use drugs.  Allergies:  Allergies  Allergen Reactions  . Adhesive [Tape] Other (See Comments)    Pt  reports, when removing tape from skin most tapes pull her skin off. Ok to use paper tape  . Morphine Nausea Only  . Other Hives    Chlorhexadine wipes/CHG wipes  . Latex Rash  . Morphine And Related Nausea And Vomiting    Medications reviewed.    ROS  multipoint review of systems was completed, all pertinent positives and  negatives are documented within the history of present illness and remainder are negative.   BP 139/65   Pulse (!) 105   Temp 97.7 F (36.5 C) (Oral)   Ht 5\' 3"  (1.6 m)   Wt 98 kg (216 lb)   BMI 38.26 kg/m   Physical Exam Gen.: No acute distress  chest: Clear to auscultation  Heart:tachycardic  abdomen: Soft, nondistended, appropriately tender to palpation at her incision sites. Staples in place to her midline, multiple laparoscopic incisions along the right abdomen, prior ostomy site to the left upper quadrant. Loop ileostomy present in the right lower quadrant with significant irritation to the skin around the entirety of the ostomy. The ostomy is pink, patent, productive of gas and stool.    No results found for this or any previous visit (from the past 48 hour(s)). No results found.  Assessment/Plan:  1. Aftercare following surgery 66 year old female with loop ileostomy after take down of end colostomy. Significant skin irritation from the area around her loop ileostomy. Patient believes it is related to the benzoin used by Dr. Tamala Julian in the ER over the weekend. The staples from the laparoscopic sites and colostomy sites were removed today without any difficulty. Half of the midline staples were removed and replaced with Steri-Strips. A new ostomy appliance was cut to the appropriate size and placed on the abdomen in clinic today. She was observed in the clinic for approximately 30 minutes after the appliance was changed to ensure it didn't leak and there was no evidence of leaking at the time of discharge from clinic. Patient is going to follow up on Friday with a nurse visit with the ostomy nurse and next week with Dr. Burt Knack for a wound check and hopefully to remove the remainder of her staples were midline. She'll follow up with me the week after she sees Dr. Burt Knack.     [name], MD FACS General Surgeon  03/09/2017,12:16 PM

## 2017-03-09 NOTE — Telephone Encounter (Signed)
Patient came in to the office today and had her questions answered by Dr. Adonis Huguenin.

## 2017-03-11 ENCOUNTER — Ambulatory Visit: Payer: PPO

## 2017-03-15 DIAGNOSIS — E119 Type 2 diabetes mellitus without complications: Secondary | ICD-10-CM | POA: Diagnosis not present

## 2017-03-15 DIAGNOSIS — Z48815 Encounter for surgical aftercare following surgery on the digestive system: Secondary | ICD-10-CM | POA: Diagnosis not present

## 2017-03-15 DIAGNOSIS — Z7982 Long term (current) use of aspirin: Secondary | ICD-10-CM | POA: Diagnosis not present

## 2017-03-15 DIAGNOSIS — K5730 Diverticulosis of large intestine without perforation or abscess without bleeding: Secondary | ICD-10-CM | POA: Diagnosis not present

## 2017-03-15 DIAGNOSIS — I10 Essential (primary) hypertension: Secondary | ICD-10-CM | POA: Diagnosis not present

## 2017-03-17 ENCOUNTER — Ambulatory Visit (INDEPENDENT_AMBULATORY_CARE_PROVIDER_SITE_OTHER): Payer: PPO | Admitting: Surgery

## 2017-03-17 ENCOUNTER — Encounter: Payer: Self-pay | Admitting: Surgery

## 2017-03-17 VITALS — BP 127/83 | HR 108 | Temp 97.8°F | Ht 63.0 in

## 2017-03-17 DIAGNOSIS — K5732 Diverticulitis of large intestine without perforation or abscess without bleeding: Secondary | ICD-10-CM

## 2017-03-17 DIAGNOSIS — Z933 Colostomy status: Secondary | ICD-10-CM

## 2017-03-17 NOTE — Progress Notes (Signed)
Outpatient postop visit  03/17/2017  Kara [name] is an 66 y.o. female.    Procedure: Colostomy closure with protecting ileostomy  CC: Leakage from ileostomy bag  HPI: Patient status post colostomy closure with protecting ileostomy by Dr. Rob Bunting.  Patient is seen multiple ostomy nurses to attempt obtaining good seal of the bag.  She is morbidly obese and the top of the bag is in a crease.  The ileostomy has retracted significantly due to her obesity.  She is very frustrated and wishes to have this closed as soon as possible. Medications reviewed.    Physical Exam:  There were no vitals taken for this visit.    PE: Morbidly obese female patient in no acute distress.  Ileostomy has retracted but is viable and functional.  There is a large amount of liquid stool in the bag.  No active leakage at this time.  The top of the bag cephalad is within a crease in her morbidly obese abdomen.  The midline wound is healing well no erythema staples are removed.  Nontender calves.    Assessment/Plan:  Status post colostomy closure with protecting ileostomy in place.  Patient experiencing considerable leakage from her ileostomy bag.  She has seen ostomy nurses and multiple attempts at changing ostomy appliances has failed to result in anything longer than 2 days of ileostomy function without leakage.  He is very frustrated and wants to have this repaired as soon as possible.  She is about 3 weeks out at this point.  She did have a bowel movement and has had no pelvic or rectal pain.  I will discussed with Dr. Adine Madura who is out of town at this time about timing of closure.  I know he wanted to study her pelvic anastomosis prior to closure but I believe at the 3-week timeframe that would not be safe.  We will discuss options and proceed next week when he returns.  Florene Glen, MD, FACS

## 2017-03-17 NOTE — Patient Instructions (Signed)
We will have Dr.Woodham call you the first of the week. Please keep your appointment on 03/30/17.

## 2017-03-18 DIAGNOSIS — Z433 Encounter for attention to colostomy: Secondary | ICD-10-CM | POA: Diagnosis not present

## 2017-03-21 ENCOUNTER — Telehealth: Payer: Self-pay | Admitting: Surgery

## 2017-03-21 NOTE — Telephone Encounter (Signed)
Patient has called and would like to know the status of

## 2017-03-21 NOTE — Telephone Encounter (Signed)
I will need to talk to Dr. Adonis Huguenin before I could call the patient back.

## 2017-03-21 NOTE — Telephone Encounter (Signed)
Called patient to let her know that Dr. Adonis Huguenin would see her tomorrow at 2:45 PM. Patient agreed.

## 2017-03-21 NOTE — Telephone Encounter (Signed)
Patient has called and would like to follow up on the status of conversation between Dr Burt Knack and Dr Adonis Huguenin about the timing for closure. Patient status post colostomy closure with protecting ileostomy. Patient is still experiencing considerable leakage from her ileostomy bag all through the weekend. Please advise patient.

## 2017-03-22 ENCOUNTER — Ambulatory Visit (INDEPENDENT_AMBULATORY_CARE_PROVIDER_SITE_OTHER): Payer: PPO | Admitting: General Surgery

## 2017-03-22 ENCOUNTER — Encounter: Payer: Self-pay | Admitting: General Surgery

## 2017-03-22 VITALS — BP 107/72 | HR 90 | Temp 97.7°F | Ht 63.0 in

## 2017-03-22 DIAGNOSIS — Z9889 Other specified postprocedural states: Secondary | ICD-10-CM

## 2017-03-22 NOTE — Progress Notes (Signed)
Outpatient Surgical Follow Up  03/22/2017  Kara [name] is an 66 y.o. female.   Chief Complaint  Patient presents with  . Routine Post Op    Discuss surgery date    HPI: 66 year old female returns to clinic for follow-up now almost 4 weeks status post colostomy takedown with loop ileostomy creation secondary to anastomotic leak.  Patient reports he continues to have issues with Seals to her ostomy appliance secondary to her abdominal wall folds.  She has been disinclined to perform physical activity secondary to the persistent and recurrent leaks.  She denies any fevers, chills, nausea vomiting, chest pain, shortness of breath.  She has been maintaining hydration.  She is mostly frustrated with the leakage and skin irritation secondary to bilious effluent.  Past Medical History:  Diagnosis Date  . 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 Diabetes ([place])   . Diverticulitis   . Hypertension   . 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 APPENDECTOMY N/A 02/24/2017   Procedure: Incidental  APPENDECTOMY;  Surgeon: [name], MD;  Location: [name] ORS;  Service: General;  Laterality: N/A;  . CARPAL TUNNEL RELEASE Bilateral   . COLON RESECTION SIGMOID N/A 11/02/2016   Procedure: COLON RESECTION SIGMOID;  Surgeon: [name], MD;  Location: [name] ORS;  Service: General;  Laterality: N/A;  . COLON SURGERY  11/02/2016  . COLONOSCOPY WITH PROPOFOL N/A 02/03/2017   Procedure: COLONOSCOPY WITH PROPOFOL;  Surgeon: Lin Landsman, MD;  Location: [facility] ENDOSCOPY;  Service: Gastroenterology;  Laterality: N/A;  . COLOSTOMY N/A 11/02/2016   Procedure: COLOSTOMY;  Surgeon: [name], MD;  Location: [name] ORS;  Service: General;  Laterality: N/A;  . COLOSTOMY REVERSAL N/A 02/24/2017   Procedure: COLOSTOMY REVERSAL, ostomy takedown, spleenic flexure mobilization, excision rectal stump/distal  sigmoid, anastomosis with suture reinforcement;  Surgeon: [name], MD;  Location: [name] ORS;  Service: General;  Laterality: N/A;  . ILEO LOOP DIVERSION N/A 02/24/2017   Procedure: ILEO LOOP COLOSTOMY;  Surgeon: [name], MD;  Location: [name] ORS;  Service: General;  Laterality: N/A;  . IVC FILTER INSERTION Right 10/2016  . KNEE SURGERY     torn menicus  . LAPAROSCOPY N/A 02/24/2017   Procedure: LAPAROSCOPY DIAGNOSTIC;  Surgeon: [name], MD;  Location: [name] ORS;  Service: General;  Laterality: N/A;  . LYSIS OF ADHESION N/A 02/24/2017   Procedure: LYSIS OF ADHESION;  Surgeon: [name], MD;  Location: [name] ORS;  Service: General;  Laterality: N/A;  . PULMONARY VENOGRAPHY N/A 11/19/2016   Procedure: Pulmonary Venography; IVC filter placement; possible pulmonary thrombectomy;  Surgeon: Katha Cabal, MD;  Location: [place] CV LAB;  Service: Cardiovascular;  Laterality: N/A;  . SIGMOIDOSCOPY N/A 11/13/2016   Procedure: endoscopic  flexible SIGMOIDOSCOPY;  Surgeon: Florene Glen, MD;  Location: [name] ORS;  Service: General;  Laterality: N/A;    Family History  Problem Relation Age of Onset  . Diabetes Mother   . Heart disease Mother   . Cancer Mother   . Heart disease Father   . Diabetes Sister   . Heart disease Sister     Social History:  reports that she has never smoked. She has never used smokeless tobacco. She reports that she does not drink alcohol or use drugs.  Allergies:  Allergies  Allergen Reactions  . Adhesive [Tape] Other (See Comments)    Pt reports, when  removing tape from skin most tapes pull her skin off. Ok to use paper tape  . Morphine Nausea Only  . Other Hives    Chlorhexadine wipes/CHG wipes  . Latex Rash  . Morphine And Related Nausea And Vomiting    Medications reviewed.    ROS A multipoint review of systems was completed, all pertinent positives and negatives are documented within the HPI and the remainder are  negative   BP 107/72   Pulse 90   Temp 97.7 F (36.5 C) (Oral)   Ht 5\' 3"  (1.6 m)   Physical Exam General: No acute distress Chest: Clear to auscultation Heart: Regular rhythm Abdomen: Soft, nontender, nondistended.  Well-healed midline and prior left upper quadrant colostomy site.  A loop ileostomy present in the right lower quadrant is pink, patent and productive of gas and stool.  Current ostomy appliance with a good seal but evidence of skin irritation around the ostomy appliance.    No results found for this or any previous visit (from the past 48 hour(s)). No results found.  Assessment/Plan:  1. S/P colostomy takedown 66 year old female with a loop ileostomy in the right lower quadrant with difficult appliance seals due to her body habitus.  Discussed the need to wait a minimum of 6 weeks before starting her ostomy reversal.  Given the continued difficulty with her feels we will order her barium enema for exactly 6 weeks postop.  She will follow-up with me the following morning to discuss the results and whether or not she is ready to be scheduled for surgery. - DG Colon W/Cm - Wo/W Kub; Future     [name], MD [facility] General Surgeon  03/22/2017,4:48 PM

## 2017-03-22 NOTE — Patient Instructions (Addendum)
The Barium Enema is scheduled for 04/07/17 at 8:45 am.   Begin a clear liquid diet the day before the procedure. Nothing to eat/drink after midnight the day before your procedure.   Please see your follow up appointment listed below to discuss results with Dr.Woodham. 04/08/17 @ 7:45 am.

## 2017-03-30 ENCOUNTER — Encounter: Payer: Self-pay | Admitting: General Surgery

## 2017-03-30 DIAGNOSIS — K5730 Diverticulosis of large intestine without perforation or abscess without bleeding: Secondary | ICD-10-CM | POA: Diagnosis not present

## 2017-03-30 DIAGNOSIS — Z7982 Long term (current) use of aspirin: Secondary | ICD-10-CM | POA: Diagnosis not present

## 2017-03-30 DIAGNOSIS — E119 Type 2 diabetes mellitus without complications: Secondary | ICD-10-CM | POA: Diagnosis not present

## 2017-03-30 DIAGNOSIS — Z48815 Encounter for surgical aftercare following surgery on the digestive system: Secondary | ICD-10-CM | POA: Diagnosis not present

## 2017-03-30 DIAGNOSIS — I10 Essential (primary) hypertension: Secondary | ICD-10-CM | POA: Diagnosis not present

## 2017-03-31 DIAGNOSIS — E119 Type 2 diabetes mellitus without complications: Secondary | ICD-10-CM | POA: Diagnosis not present

## 2017-03-31 DIAGNOSIS — I10 Essential (primary) hypertension: Secondary | ICD-10-CM | POA: Diagnosis not present

## 2017-03-31 DIAGNOSIS — Z48815 Encounter for surgical aftercare following surgery on the digestive system: Secondary | ICD-10-CM | POA: Diagnosis not present

## 2017-03-31 DIAGNOSIS — K5730 Diverticulosis of large intestine without perforation or abscess without bleeding: Secondary | ICD-10-CM | POA: Diagnosis not present

## 2017-04-01 DIAGNOSIS — Z433 Encounter for attention to colostomy: Secondary | ICD-10-CM | POA: Diagnosis not present

## 2017-04-07 ENCOUNTER — Ambulatory Visit
Admission: RE | Admit: 2017-04-07 | Discharge: 2017-04-07 | Disposition: A | Discharge status: Home / Self Care | Payer: PPO | Source: Ambulatory Visit | Attending: General Surgery | Admitting: General Surgery

## 2017-04-07 DIAGNOSIS — R933 Abnormal findings on diagnostic imaging of other parts of digestive tract: Secondary | ICD-10-CM | POA: Insufficient documentation

## 2017-04-07 DIAGNOSIS — Z48815 Encounter for surgical aftercare following surgery on the digestive system: Secondary | ICD-10-CM | POA: Diagnosis not present

## 2017-04-07 DIAGNOSIS — Z933 Colostomy status: Secondary | ICD-10-CM | POA: Diagnosis not present

## 2017-04-07 DIAGNOSIS — Z9889 Other specified postprocedural states: Secondary | ICD-10-CM

## 2017-04-08 ENCOUNTER — Encounter: Payer: Self-pay | Admitting: General Surgery

## 2017-04-08 ENCOUNTER — Ambulatory Visit (INDEPENDENT_AMBULATORY_CARE_PROVIDER_SITE_OTHER): Payer: PPO | Admitting: General Surgery

## 2017-04-08 VITALS — BP 121/80 | HR 103 | Temp 98.0°F | Ht 63.0 in | Wt 209.0 lb

## 2017-04-08 DIAGNOSIS — Z932 Ileostomy status: Secondary | ICD-10-CM | POA: Insufficient documentation

## 2017-04-08 MED ORDER — OXYCODONE-ACETAMINOPHEN 7.5-325 MG PO TABS: 1.0000 | ORAL_TABLET | Freq: Four times a day (QID) | ORAL | 0 refills | Status: DC | PRN

## 2017-04-08 NOTE — Patient Instructions (Addendum)
We have scheduled you for a CT Scan of your Abdomen and Pelvis. This has been scheduled at [place] at our [facility] location. Please Check-in at 7:45AM 15 minutes prior to your scheduled appointment. If you need to reschedule your Scan, you may do so by calling [phone]. Starting at [facility]  Thursday morning on water.  You will need to pick up a prep kit at least 24 hours in advance of your Scan: You may pick this up at the [place] department at [place] Location, or [employer].  Bring a list of medications with you to your appointment and you may have nothing to eat or drink 4 hours prior to your CT Scan.     We have also sent you home with a new refill of pain medication.  We will see you back in office to review these results as listed below:

## 2017-04-08 NOTE — Progress Notes (Signed)
Outpatient Surgical Follow Up  04/08/2017  Kara [name] Kara [name] is an 66 y.o. female.   No chief complaint on file.   HPI: 65-year-old female returns to clinic for follow-up now approximately 6 weeks status post colostomy reversal with loop ileostomy creation.  She had a barium enema yesterday.  Patient and radiologist reports that she could not tolerate the full pressure from the barium enema secondary to pain.  Patient states she continues to have intermittent lower abdominal pains, especially with activity.  Otherwise she states she is eating well denies any fevers, chills, nausea, vomiting, chest pain, shortness of breath.  She does report that on occasion she notices pill casings coming out of her ostomy.  She continues to have recurrent leaking from her ostomy appliance secondary to its location and her body habitus.  Past Medical History:  Diagnosis Date  . 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 Diabetes ([place])   . Diverticulitis   . Hypertension   . 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)     Past Surgical History:  Procedure Laterality Date  . APPENDECTOMY N/A 02/24/2017   Procedure: Incidental  APPENDECTOMY;  Surgeon: [name], MD;  Location: [name] ORS;  Service: General;  Laterality: N/A;  . CARPAL TUNNEL RELEASE Bilateral   . COLON RESECTION SIGMOID N/A 11/02/2016   Procedure: COLON RESECTION SIGMOID;  Surgeon: [name], MD;  Location: [name] ORS;  Service: General;  Laterality: N/A;  . COLON SURGERY  11/02/2016  . COLONOSCOPY WITH PROPOFOL N/A 02/03/2017   Procedure: COLONOSCOPY WITH PROPOFOL;  Surgeon: Lin Landsman, MD;  Location: [facility] ENDOSCOPY;  Service: Gastroenterology;  Laterality: N/A;  . COLOSTOMY N/A 11/02/2016   Procedure: COLOSTOMY;  Surgeon: [name], MD;  Location: [name] ORS;  Service: General;  Laterality: N/A;  . COLOSTOMY REVERSAL N/A 02/24/2017   Procedure: COLOSTOMY REVERSAL, ostomy takedown,  spleenic flexure mobilization, excision rectal stump/distal sigmoid, anastomosis with suture reinforcement;  Surgeon: [name], MD;  Location: [name] ORS;  Service: General;  Laterality: N/A;  . ILEO LOOP DIVERSION N/A 02/24/2017   Procedure: ILEO LOOP COLOSTOMY;  Surgeon: [name], MD;  Location: [name] ORS;  Service: General;  Laterality: N/A;  . IVC FILTER INSERTION Right 10/2016  . KNEE SURGERY     torn menicus  . LAPAROSCOPY N/A 02/24/2017   Procedure: LAPAROSCOPY DIAGNOSTIC;  Surgeon: [name], MD;  Location: [name] ORS;  Service: General;  Laterality: N/A;  . LYSIS OF ADHESION N/A 02/24/2017   Procedure: LYSIS OF ADHESION;  Surgeon: [name], MD;  Location: [name] ORS;  Service: General;  Laterality: N/A;  . PULMONARY VENOGRAPHY N/A 11/19/2016   Procedure: Pulmonary Venography; IVC filter placement; possible pulmonary thrombectomy;  Surgeon: Katha Cabal, MD;  Location: [place] CV LAB;  Service: Cardiovascular;  Laterality: N/A;  . SIGMOIDOSCOPY N/A 11/13/2016   Procedure: endoscopic  flexible SIGMOIDOSCOPY;  Surgeon: Florene Glen, MD;  Location: [name] ORS;  Service: General;  Laterality: N/A;    Family History  Problem Relation Age of Onset  . Diabetes Mother   . Heart disease Mother   . Cancer Mother   . Heart disease Father   . Diabetes Sister   . Heart disease Sister     Social History:  reports that  has never smoked. she has never used smokeless tobacco. She reports that she does not drink alcohol or use drugs.  Allergies:  Allergies  Allergen Reactions  . Adhesive [Tape] Other (  See Comments)    Pt reports, when removing tape from skin most tapes pull her skin off. Ok to use paper tape  . Morphine Nausea Only  . Other Hives    Chlorhexadine wipes/CHG wipes  . Latex Rash  . Morphine And Related Nausea And Vomiting    Medications reviewed.    ROS A multipoint review of systems was completed, all pertinent positives and  negatives are documented within the HPI   There were no vitals taken for this visit.  Physical Exam General: No acute distress Chest: Clear to auscultation Heart: Rate and rhythm Abdomen: Soft, nondistended, minimally tender in the low midline in the right lower quadrant around her ostomy.  Loop ileostomy present in the right lower quadrant that is pink, patent, productive of stool.    No results found for this or any previous visit (from the past 48 hour(s)). Dg Colon W/cm - Wo/w Kub  Result Date: 04/07/2017 CLINICAL DATA:  History of diverticulitis with recent partial colectomy and colostomy creation. EXAM: BE WITH CONTRAST -WITH KUB CONTRAST:  Thin barium FLUOROSCOPY TIME:  Fluoroscopy Time:  1 minutes, 12 seconds Radiation Exposure Index (if provided by the fluoroscopic device): 2491 micro Gy per meters square Number of Acquired Spot Images: 12 COMPARISON:  Abdominal and pelvic CT scan of March 05, 2017 FINDINGS: Numerous radiodensities within the true bony pelvis that may reflect residual barium. There is an inferior vena caval filter present. The barium catheter was placed with some difficulty due to patient tenderness. The patient would tolerate no inflation of the balloon. Very slow instillation of barium was performed to avoid over distension. In the left lateral decubitus position barium was seen to enter the rectal vault and then traverse an area of irregular proximal rectum-distal sigmoid and then moves retrograde into more normal appearing sigmoid and then into the descending colon. However, barium appear to fill a blind loop of sigmoid or contained perforation as well. This collection of barium did not appear to move more proximally. IMPRESSION: Limited study due to the patient's inability to tolerate more than minimal distention. Probable contained perforation versus postsurgical blind loop of sigmoid to the right of the more normal appearing distal sigmoid. No free spillage of  contrast. Reportedly the patient has had remote history of Hartmann's pouch creation. This may be complicating the appearance of the rectosigmoid. Electronically Signed   By: David  Martinique M.D.   On: 04/07/2017 10:47    Assessment/Plan:  1. Ileostomy in place [facility]) 66 year old female now 6 weeks status post loop ileostomy creation secondary to a failed leak test from her colostomy reversal.  Barium enema appears to show a contained perforation.  Discussed with the patient that based on these results she is not ready to be scheduled for her loop ileostomy takedown.  We will obtain cross-sectional images in about 2 weeks to further evaluate the described contained perforation versus blind loop.  Although patient was frustrated by this news she voiced understanding.  She will follow-up in clinic Monday after her CT scan for [place] to discuss the results and the next steps.  Should the CT scan provide a positive result she would be scheduled for her loop ileostomy takedown shortly thereafter.     [name], MD FACS General Surgeon  04/08/2017,8:11 AM

## 2017-04-20 ENCOUNTER — Telehealth: Payer: Self-pay

## 2017-04-20 NOTE — Telephone Encounter (Signed)
Call patient at this time to remind her to bring her ostomy supplies to her CT Scan appointment tomorrow. She verbalized understanding at this time and advised that she would prefer the lady that's bringing her to the appointment tomorrow to apply the new ostomy bag. I verbalized

## 2017-04-21 ENCOUNTER — Ambulatory Visit
Admission: RE | Admit: 2017-04-21 | Discharge: 2017-04-21 | Disposition: A | Discharge status: Home / Self Care | Payer: PPO | Source: Ambulatory Visit | Attending: General Surgery | Admitting: General Surgery

## 2017-04-21 ENCOUNTER — Other Ambulatory Visit
Admission: RE | Admit: 2017-04-21 | Discharge: 2017-04-21 | Disposition: A | Discharge status: Home / Self Care | Payer: PPO | Source: Ambulatory Visit | Attending: General Surgery | Admitting: General Surgery

## 2017-04-21 DIAGNOSIS — M48061 Spinal stenosis, lumbar region without neurogenic claudication: Secondary | ICD-10-CM | POA: Diagnosis not present

## 2017-04-21 DIAGNOSIS — Z932 Ileostomy status: Secondary | ICD-10-CM

## 2017-04-21 DIAGNOSIS — M4726 Other spondylosis with radiculopathy, lumbar region: Secondary | ICD-10-CM | POA: Diagnosis not present

## 2017-04-21 DIAGNOSIS — R188 Other ascites: Secondary | ICD-10-CM | POA: Insufficient documentation

## 2017-04-21 DIAGNOSIS — M5136 Other intervertebral disc degeneration, lumbar region: Secondary | ICD-10-CM | POA: Diagnosis not present

## 2017-04-21 DIAGNOSIS — K8020 Calculus of gallbladder without cholecystitis without obstruction: Secondary | ICD-10-CM | POA: Diagnosis not present

## 2017-04-21 LAB — COMPREHENSIVE METABOLIC PANEL
ALT: 53 U/L (ref 14–54)
AST: 40 U/L (ref 15–41)
Albumin: 3.8 g/dL (ref 3.5–5.0)
Alkaline Phosphatase: 88 U/L (ref 38–126)
Anion gap: 10 (ref 5–15)
BUN: 11 mg/dL (ref 6–20)
CHLORIDE: 104 mmol/L (ref 101–111)
CO2: 24 mmol/L (ref 22–32)
Calcium: 9.4 mg/dL (ref 8.9–10.3)
Creatinine, Ser: 0.71 mg/dL (ref 0.44–1.00)
Glucose, Bld: 149 mg/dL — ABNORMAL HIGH (ref 65–99)
POTASSIUM: 4 mmol/L (ref 3.5–5.1)
SODIUM: 138 mmol/L (ref 135–145)
Total Bilirubin: 0.8 mg/dL (ref 0.3–1.2)
Total Protein: 7.4 g/dL (ref 6.5–8.1)

## 2017-04-21 LAB — POCT I-STAT CREATININE: CREATININE: 0.7 mg/dL (ref 0.44–1.00)

## 2017-04-21 MED ORDER — IOPAMIDOL (ISOVUE-300) INJECTION 61%
100.0000 mL | Freq: Once | INTRAVENOUS | Status: AC | PRN
  Administered 2017-04-21: 100 mL via INTRAVENOUS

## 2017-04-25 ENCOUNTER — Encounter: Payer: Self-pay | Admitting: General Surgery

## 2017-04-25 ENCOUNTER — Ambulatory Visit (INDEPENDENT_AMBULATORY_CARE_PROVIDER_SITE_OTHER): Payer: PPO | Admitting: General Surgery

## 2017-04-25 VITALS — BP 142/92 | HR 81 | Temp 97.8°F | Ht 63.0 in | Wt 207.4 lb

## 2017-04-25 DIAGNOSIS — Z932 Ileostomy status: Secondary | ICD-10-CM

## 2017-04-25 NOTE — Patient Instructions (Signed)
We have scheduled for you to have the listed procedure below on 12/27 with Dr. Adonis Huguenin. Please see your blue sheet for further instructions. If you have any questions or concerns please give our office a call.  Flexible Sigmoidoscopy Flexible sigmoidoscopy is a procedure to check the lower colon (sigmoid colon). The procedure is done using a short, flexible tube that has a small camera attached (sigmoidoscope). The sigmoidoscope is inserted into the anus and passed through the rectum into the sigmoid colon. The camera on the scope sends images to a TV monitor in the exam room. You may need this exam if you have had changes in your bowel habits, bleeding from your rectum, or pain in your abdomen. You may also need this test if your health care provider wants to check for abnormal growths in your rectum or lower colon. Tell a health care provider about:  Any allergies you have.  All medicines you are taking, including vitamins, herbs, eye drops, creams, and over-the-counter medicines.  Any problems you or family members have had with anesthetic medicines.  Any blood disorders you have.  Any surgeries you have had.  Any medical conditions you have.  Whether you are pregnant or may be pregnant. What are the risks? Generally, this is a safe procedure. However, problems may occur, including:  Abdominal pain.  Bleeding.  Infection or inflammation in your colon.  A tear through your rectum or colon (perforation).  Allergic reactions to medicines.  Damage to other structures or organs.  What happens before the procedure?  Follow instructions from your health care provider about eating and drinking restrictions.  Ask your health care provider about: ? Changing or stopping your regular medicines. This is especially important if you are taking diabetes medicines or blood thinners. ? Taking medicines such as aspirin and ibuprofen. These medicines can thin your blood. Do not take these  medicines before your procedure if your health care provider instructs you not to.  Plan to have someone take you home from the hospital or clinic.  If you will be going home right after the procedure, plan to have someone with you for 24 hours.  You will need to follow a specific diet and use a laxative or an enema before the procedure (bowel prep). This is to clean out your colon. The bowel prep is often done the night before the procedure or the morning of the procedure. Follow instructions exactly as given by your health care provider.  You may need to have a rectal suppository or enema in the morning before your procedure. What happens during the procedure?  An IV tube may be inserted into one of your veins.  You may be given a medicine to help you relax (sedative).  You will lie on your side on the exam table. You may be asked to change positions during the procedure.  The sigmoidoscope will be lubricated and gently inserted into your anus.  Air will be injected into your colon, and the sigmoidoscope will be moved into your sigmoid colon. You may feel some pressure or cramping.  Your health care provider will check the monitor for any abnormal findings.  Your health care provider may take a small piece of tissue to check under a microscope (biopsy).  The scope will be slowly removed. The procedure may vary among health care providers and hospitals. What happens after the procedure?  Your blood pressure, heart rate, breathing rate, and blood oxygen level will be monitored until the medicines you  were given have worn off.  Do not drive for 24 hours if you received a sedative. This information is not intended to replace advice given to you by your health care provider. Make sure you discuss any questions you have with your health care provider. Document Released: 05/07/2000 Document Revised: 11/28/2015 Document Reviewed: 08/09/2015 Elsevier Interactive Patient Education  [employer].

## 2017-04-25 NOTE — H&P (View-Only) (Signed)
Outpatient Surgical Follow Up  04/25/2017  Kara [name] Kara [name] is an 66 y.o. female.   Chief Complaint  Patient presents with  . Other    Review CT Scan of Illestomy.    HPI: 66 year old female returns to clinic for follow-up secondary to recent CT scan last week.  She is now 2 months status post colostomy takedown of loop ileostomy creation.  She reports she continues to have some leakage of her ostomy appliance but it is much improved.  She is otherwise eating well and has no other complaints.  She reports she stopped her Xarelto herself due to its cost.  She currently denies any fevers, chills, nausea, vomiting, chest pain, shortness of breath.  She is moving around much better and having return of normal energy.  Past Medical History:  Diagnosis Date  . 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 Diabetes ([place])   . Diverticulitis   . Hypertension   . 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 APPENDECTOMY N/A 02/24/2017   Procedure: Incidental  APPENDECTOMY;  Surgeon: [name], MD;  Location: [name] ORS;  Service: General;  Laterality: N/A;  . CARPAL TUNNEL RELEASE Bilateral   . COLON RESECTION SIGMOID N/A 11/02/2016   Procedure: COLON RESECTION SIGMOID;  Surgeon: [name], MD;  Location: [name] ORS;  Service: General;  Laterality: N/A;  . COLON SURGERY  11/02/2016  . COLONOSCOPY WITH PROPOFOL N/A 02/03/2017   Procedure: COLONOSCOPY WITH PROPOFOL;  Surgeon: Lin Landsman, MD;  Location: [facility] ENDOSCOPY;  Service: Gastroenterology;  Laterality: N/A;  . COLOSTOMY N/A 11/02/2016   Procedure: COLOSTOMY;  Surgeon: [name], MD;  Location: [name] ORS;  Service: General;  Laterality: N/A;  . COLOSTOMY REVERSAL N/A 02/24/2017   Procedure: COLOSTOMY REVERSAL, ostomy takedown, spleenic flexure mobilization, excision rectal stump/distal sigmoid, anastomosis with suture reinforcement;   Surgeon: [name], MD;  Location: [name] ORS;  Service: General;  Laterality: N/A;  . ILEO LOOP DIVERSION N/A 02/24/2017   Procedure: ILEO LOOP COLOSTOMY;  Surgeon: [name], MD;  Location: [name] ORS;  Service: General;  Laterality: N/A;  . IVC FILTER INSERTION Right 10/2016  . KNEE SURGERY     torn menicus  . LAPAROSCOPY N/A 02/24/2017   Procedure: LAPAROSCOPY DIAGNOSTIC;  Surgeon: [name], MD;  Location: [name] ORS;  Service: General;  Laterality: N/A;  . LYSIS OF ADHESION N/A 02/24/2017   Procedure: LYSIS OF ADHESION;  Surgeon: [name], MD;  Location: [name] ORS;  Service: General;  Laterality: N/A;  . PULMONARY VENOGRAPHY N/A 11/19/2016   Procedure: Pulmonary Venography; IVC filter placement; possible pulmonary thrombectomy;  Surgeon: Katha Cabal, MD;  Location: [facility];  Service: Cardiovascular;  Laterality: N/A;  . SIGMOIDOSCOPY N/A 11/13/2016   Procedure: endoscopic  flexible SIGMOIDOSCOPY;  Surgeon: Florene Glen, MD;  Location: [name] ORS;  Service: General;  Laterality: N/A;    Family History  Problem Relation Age of Onset  . Diabetes Mother   . Heart disease Mother   . Cancer Mother   . Heart disease Father   . Diabetes Sister   . Heart disease Sister     Social History:  reports that  has never smoked. she has never used smokeless tobacco. She reports that she does not drink alcohol or use drugs.  Allergies:  Allergies  Allergen Reactions  . Adhesive [Tape] Other (See Comments)  Pt reports, when removing tape from skin most tapes pull her skin off. Ok to use paper tape  . Morphine Nausea Only  . Other Hives    Chlorhexadine wipes/CHG wipes  . Latex Rash  . Morphine And Related Nausea And Vomiting    Medications reviewed.    ROS A multipoint review of systems was completed, all pertinent positives and negatives are documented within the HPI and the remainder   BP (!) 142/92   Pulse 81   Temp 97.8 F (36.6 C)  (Oral)   Ht 5\' 3"  (1.6 m)   Wt 94.1 kg (207 lb 6.4 oz)   BMI 36.74 kg/m   Physical Exam General: No acute distress Chest: Clear to auscultation  heart: Rate and rhythm Abdomen: Soft, nontender, nondistended.  Loop ileostomy present in the right lower quadrant that is pink, patent, productive of stool.    No results found for this or any previous visit (from the past 48 hour(s)). No results found.  Assessment/Plan:  1. Ileostomy in place [facility]) 66 year old female with a loop ileostomy in the right lower quadrant.  Recent barium enema and then CT scan both show what appears to be a contained perforation.  Discussed at length with the patient that we need to wait at another few weeks before we attempt to visualize the area again.  Discussed the options of repeat barium enema versus performing a flexible sigmoidoscopy.  Both of them were again described in detail and patient states she would prefer to have a sigmoidoscopy over the barium enema due to her inability to tolerate the pain from the enema catheter.  We will tentatively plan to do this in the operating room on December 27.  Will use the operating room due to the potential risk of perforation and need for urgent surgical intervention.  All questions answered the patient's satisfaction.     [name], MD FACS General Surgeon  04/25/2017,10:25 AM

## 2017-04-25 NOTE — Progress Notes (Signed)
Outpatient Surgical Follow Up  04/25/2017  Acquanetta Belling Mercedez Boule is an 66 y.o. female.   Chief Complaint  Patient presents with  . Other    Review CT Scan of Illestomy.    HPI: 66 year old female returns to clinic for follow-up secondary to recent CT scan last week.  She is now 2 months status post colostomy takedown of loop ileostomy creation.  She reports she continues to have some leakage of her ostomy appliance but it is much improved.  She is otherwise eating well and has no other complaints.  She reports she stopped her Xarelto herself due to its cost.  She currently denies any fevers, chills, nausea, vomiting, chest pain, shortness of breath.  She is moving around much better and having return of normal energy.  Past Medical History:  Diagnosis Date  . 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 Diabetes ([place])   . Diverticulitis   . Hypertension   . 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 APPENDECTOMY N/A 02/24/2017   Procedure: Incidental  APPENDECTOMY;  Surgeon: [name], MD;  Location: [name] ORS;  Service: General;  Laterality: N/A;  . CARPAL TUNNEL RELEASE Bilateral   . COLON RESECTION SIGMOID N/A 11/02/2016   Procedure: COLON RESECTION SIGMOID;  Surgeon: [name], MD;  Location: [name] OR[facility];  Service: General;  Laterality: N/A;  . COLON SURGERY  11/02/2016  . COLONOSCOPY WITH PROPOFOL N/A 02/03/2017   Procedure: COLONOSCOPY WITH PROPOFOL;  Surgeon: Lin Landsman, MD;  Location: [facility] ENDOSCOPY;  Service: Gastroenterology;  Laterality: N/A;  . COLOSTOMY N/A 11/02/2016   Procedure: COLOSTOMY;  Surgeon: [name], MD;  Location: [name] ORS;  Service: General;  Laterality: N/A;  . COLOSTOMY REVERSAL N/A 02/24/2017   Procedure: COLOSTOMY REVERSAL, ostomy takedown, spleenic flexure mobilization, excision rectal stump/distal sigmoid, anastomosis with suture reinforcement;   Surgeon: [name], MD;  Location: [name] ORS;  Service: General;  Laterality: N/A;  . ILEO LOOP DIVERSION N/A 02/24/2017   Procedure: ILEO LOOP COLOSTOMY;  Surgeon: [name], MD;  Location: [name] ORS;  Service: General;  Laterality: N/A;  . IVC FILTER INSERTION Right 10/2016  . KNEE SURGERY     torn menicus  . LAPAROSCOPY N/A 02/24/2017   Procedure: LAPAROSCOPY DIAGNOSTIC;  Surgeon: [name], MD;  Location: [name];  Service: General;  Laterality: N/A;  . LYSIS OF ADHESION N/A 02/24/2017   Procedure: LYSIS OF ADHESION;  Surgeon: [name], MD;  Location: [name] OR[facility];  Service: General;  Laterality: N/A;  . PULMONARY VENOGRAPHY N/A 11/19/2016   Procedure: Pulmonary Venography; IVC filter placement; possible pulmonary thrombectomy;  Surgeon: Katha Cabal, MD;  Location: [facility];  Service: Cardiovascular;  Laterality: N/A;  . SIGMOIDOSCOPY N/A 11/13/2016   Procedure: endoscopic  flexible SIGMOIDOSCOPY;  Surgeon: Florene Glen, MD;  Location: [name] ORS;  Service: General;  Laterality: N/A;    Family History  Problem Relation Age of Onset  . Diabetes Mother   . Heart disease Mother   . Cancer Mother   . Heart disease Father   . Diabetes Sister   . Heart disease Sister     Social History:  reports that  has never smoked. she has never used smokeless tobacco. She reports that she does not drink alcohol or use drugs.  Allergies:  Allergies  Allergen Reactions  . Adhesive [Tape] Other (See Comments)  Pt reports, when removing tape from skin most tapes pull her skin off. Ok to use paper tape  . Morphine Nausea Only  . Other Hives    Chlorhexadine wipes/CHG wipes  . Latex Rash  . Morphine And Related Nausea And Vomiting    Medications reviewed.    ROS A multipoint review of systems was completed, all pertinent positives and negatives are documented within the HPI and the remainder   BP (!) 142/92   Pulse 81   Temp 97.8 F (36.6 C)  (Oral)   Ht 5\' 3"  (1.6 m)   Wt 94.1 kg (207 lb 6.4 oz)   BMI 36.74 kg/m   Physical Exam General: No acute distress Chest: Clear to auscultation  heart: Rate and rhythm Abdomen: Soft, nontender, nondistended.  Loop ileostomy present in the right lower quadrant that is pink, patent, productive of stool.    No results found for this or any previous visit (from the past 48 hour(s)). No results found.  Assessment/Plan:  1. Ileostomy in place [facility]) 66 year old female with a loop ileostomy in the right lower quadrant.  Recent barium enema and then CT scan both show what appears to be a contained perforation.  Discussed at length with the patient that we need to wait at another few weeks before we attempt to visualize the area again.  Discussed the options of repeat barium enema versus performing a flexible sigmoidoscopy.  Both of them were again described in detail and patient states she would prefer to have a sigmoidoscopy over the barium enema due to her inability to tolerate the pain from the enema catheter.  We will tentatively plan to do this in the operating room on December 27.  Will use the operating room due to the potential risk of perforation and need for urgent surgical intervention.  All questions answered the patient's satisfaction.     [name], MD FACS General Surgeon  04/25/2017,10:25 AM

## 2017-04-26 ENCOUNTER — Other Ambulatory Visit: Payer: Self-pay

## 2017-04-26 ENCOUNTER — Telehealth: Payer: Self-pay

## 2017-04-26 DIAGNOSIS — L600 Ingrowing nail: Secondary | ICD-10-CM | POA: Diagnosis not present

## 2017-04-26 DIAGNOSIS — L03032 Cellulitis of left toe: Secondary | ICD-10-CM | POA: Diagnosis not present

## 2017-04-26 DIAGNOSIS — M79675 Pain in left toe(s): Secondary | ICD-10-CM | POA: Diagnosis not present

## 2017-04-26 MED ORDER — OXYCODONE-ACETAMINOPHEN 7.5-325 MG PO TABS: 1.0000 | ORAL_TABLET | Freq: Four times a day (QID) | ORAL | 0 refills | Status: DC | PRN

## 2017-04-26 NOTE — Telephone Encounter (Signed)
Patient called wanting a refill on pain medication. I verbalized to her that I can ask Dr. Adonis Huguenin while I have him here in office and she stated okay. I placed patient on hold and asked Dr. Adonis Huguenin on behalf on the patient. He verbalized to refill the medication for 20 tablets. I did what he advised and verbalized to that patient that I would have it ready for her to pick up today. She verbalized that she will be here to pick it up on Thursday since she would be on this side of town. I verbalized understanding

## 2017-04-27 ENCOUNTER — Telehealth: Payer: Self-pay | Admitting: General Surgery

## 2017-04-27 NOTE — Telephone Encounter (Signed)
Pt advised of pre op date/time and sx date. Sx: 05/19/17 with Dr Brigid Re sigmoidoscopy.  Pre op: 05/11/17 @ 10:15am--office pre op.   Patient made aware to call [phone], between 1-3:00pm the day before surgery, to find out what time to arrive.

## 2017-04-27 NOTE — Telephone Encounter (Signed)
Refill of Medication printed per.Dr.Woodham and Chanel.

## 2017-05-11 ENCOUNTER — Encounter
Admission: RE | Admit: 2017-05-11 | Discharge: 2017-05-11 | Disposition: A | Discharge status: Home / Self Care | Payer: PPO | Source: Ambulatory Visit | Attending: General Surgery | Admitting: General Surgery

## 2017-05-11 ENCOUNTER — Other Ambulatory Visit: Payer: Self-pay

## 2017-05-11 ENCOUNTER — Telehealth: Payer: Self-pay

## 2017-05-11 DIAGNOSIS — Z01812 Encounter for preprocedural laboratory examination: Secondary | ICD-10-CM | POA: Insufficient documentation

## 2017-05-11 DIAGNOSIS — Z22322 Carrier or suspected carrier of Methicillin resistant Staphylococcus aureus: Secondary | ICD-10-CM | POA: Insufficient documentation

## 2017-05-11 HISTORY — DX: Other pulmonary embolism without acute cor pulmonale: I26.99

## 2017-05-11 LAB — SURGICAL PCR SCREEN
MRSA, PCR: POSITIVE — AB
STAPHYLOCOCCUS AUREUS: POSITIVE — AB

## 2017-05-11 LAB — BASIC METABOLIC PANEL
ANION GAP: 6 (ref 5–15)
BUN: 15 mg/dL (ref 6–20)
CALCIUM: 9.3 mg/dL (ref 8.9–10.3)
CHLORIDE: 109 mmol/L (ref 101–111)
CO2: 25 mmol/L (ref 22–32)
CREATININE: 0.67 mg/dL (ref 0.44–1.00)
GFR calc non Af Amer: >60 mL/min (ref >60)
GLUCOSE: 109 mg/dL — AB (ref 65–99)
Potassium: 3.7 mmol/L (ref 3.5–5.1)
Sodium: 140 mmol/L (ref 135–145)

## 2017-05-11 MED ORDER — MUPIROCIN 2 % EX OINT
1.0000 | TOPICAL_OINTMENT | Freq: Two times a day (BID) | CUTANEOUS | 0 refills | Status: DC
Start: 2017-05-11 — End: 2017-10-18

## 2017-05-11 NOTE — Telephone Encounter (Signed)
Called patient at this time and advised her that she was MRSA positive and that I sent in the ointment for her to her pharmacy. She verbalized that she has some that she can use. I told her to begin use of the ointment today up until the of surgery. She verbalized understanding.

## 2017-05-11 NOTE — Patient Instructions (Signed)
Your procedure is scheduled [id number] 05/19/17 Report to [place]. 2ND FLOOR MEDICAL MALL ENTRANCE. To find out your arrival time please call [phone] between 1PM - 3PM on Wednesday 05/18/17.  Remember: Instructions that are not followed completely may result in serious medical risk, up to and including death, or upon the discretion of your surgeon and anesthesiologist your surgery may need to be rescheduled.    __X__ 1. Do not eat anything after midnight the night before your    procedure.  No gum chewing or hard candies.  You may drink clear   liquids up to 2 hours before you are scheduled to arrive at the   hospital for your procedure. Do not drink clear liquids within 2   hours of scheduled arrival to the hospital as this may lead to your   procedure being delayed or rescheduled.       Clear liquids include:   Water or Apple juice without pulp   Clear carbohydrate beverage such as Clearfast or Gatorade   Black coffee or Clear Tea (no milk, no creamer, do not add anything   to the coffee or tea)    Diabetics should only drink water   __X__ 2. No Alcohol for 24 hours before or after surgery.   ____ 3. Bring all medications with you on the day of surgery if instructed.    __X__ 4. Notify your doctor if there is any change in your medical condition     (cold, fever, infections).             __X___5. No smoking within 24 hours of your surgery.     Do not wear jewelry, make-up, hairpins, clips or nail polish.  Do not wear lotions, powders, or perfumes.   Do not shave 48 hours prior to surgery. Men may shave face and neck.  Do not bring valuables to the hospital.    [facility] is not responsible for any belongings or valuables.               Contacts, dentures or bridgework may not be worn into surgery.  Leave your suitcase in the car. After surgery it may be brought to your room.  For patients admitted to the hospital, discharge time is determined by your                 treatment team.   Patients discharged the day of surgery will not be allowed to drive home.   Please read over the following fact sheets that you were given:   MRSA Information   __X__ Take these medicines the morning of surgery with A SIP OF WATER:    1. MAY TAKE OXYCODONE IF NEEDED  2.   3.   4.  5.  6.  ____ Fleet Enema (as directed)   ____ Use CHG Soap/SAGE wipes as directed  ____ Use inhalers on the day of surgery  ____ Stop metformin 2 days prior to surgery    ____ Take 1/2 of usual insulin dose the night before surgery and none on the morning of surgery.   __X__ Stop Coumadin/Plavix/aspirin on tODAY  __X__ Stop Anti-inflammatories such as Advil, Aleve, Ibuprofen, Motrin, Naproxen, Naprosyn, Goodies,powder, or aspirin products.  OK to take Tylenol.   __X__ Stop supplements, Vitamin E, Fish Oil until after surgery.    ____ Bring C-Pap to the hospital.

## 2017-05-19 ENCOUNTER — Other Ambulatory Visit: Payer: Self-pay

## 2017-05-19 ENCOUNTER — Ambulatory Visit: Payer: PPO | Admitting: Certified Registered"

## 2017-05-19 ENCOUNTER — Encounter: Payer: Self-pay | Admitting: *Deleted

## 2017-05-19 ENCOUNTER — Ambulatory Visit
Admission: RE | Admit: 2017-05-19 | Discharge: 2017-05-19 | Disposition: A | Discharge status: Home / Self Care | Payer: PPO | Source: Ambulatory Visit | Attending: General Surgery | Admitting: General Surgery

## 2017-05-19 ENCOUNTER — Encounter
Admission: RE | Disposition: A | Discharge status: Home / Self Care | Payer: Self-pay | Source: Ambulatory Visit | Attending: General Surgery

## 2017-05-19 ENCOUNTER — Other Ambulatory Visit: Payer: Self-pay | Admitting: General Practice

## 2017-05-19 DIAGNOSIS — Z7984 Long term (current) use of oral hypoglycemic drugs: Secondary | ICD-10-CM | POA: Diagnosis not present

## 2017-05-19 DIAGNOSIS — I110 Hypertensive heart disease with heart failure: Secondary | ICD-10-CM | POA: Insufficient documentation

## 2017-05-19 DIAGNOSIS — Z86711 Personal history of pulmonary embolism: Secondary | ICD-10-CM | POA: Diagnosis not present

## 2017-05-19 DIAGNOSIS — I509 Heart failure, unspecified: Secondary | ICD-10-CM | POA: Diagnosis not present

## 2017-05-19 DIAGNOSIS — Z980 Intestinal bypass and anastomosis status: Secondary | ICD-10-CM | POA: Insufficient documentation

## 2017-05-19 DIAGNOSIS — Z8249 Family history of ischemic heart disease and other diseases of the circulatory system: Secondary | ICD-10-CM | POA: Insufficient documentation

## 2017-05-19 DIAGNOSIS — Z885 Allergy status to narcotic agent status: Secondary | ICD-10-CM | POA: Diagnosis not present

## 2017-05-19 DIAGNOSIS — E119 Type 2 diabetes mellitus without complications: Secondary | ICD-10-CM | POA: Insufficient documentation

## 2017-05-19 DIAGNOSIS — M479 Spondylosis, unspecified: Secondary | ICD-10-CM | POA: Insufficient documentation

## 2017-05-19 DIAGNOSIS — I5023 Acute on chronic systolic (congestive) heart failure: Secondary | ICD-10-CM | POA: Diagnosis not present

## 2017-05-19 DIAGNOSIS — Z432 Encounter for attention to ileostomy: Secondary | ICD-10-CM | POA: Diagnosis not present

## 2017-05-19 DIAGNOSIS — Z8719 Personal history of other diseases of the digestive system: Secondary | ICD-10-CM | POA: Insufficient documentation

## 2017-05-19 DIAGNOSIS — Z91040 Latex allergy status: Secondary | ICD-10-CM | POA: Insufficient documentation

## 2017-05-19 DIAGNOSIS — Z932 Ileostomy status: Secondary | ICD-10-CM

## 2017-05-19 DIAGNOSIS — Z888 Allergy status to other drugs, medicaments and biological substances status: Secondary | ICD-10-CM | POA: Insufficient documentation

## 2017-05-19 DIAGNOSIS — K5732 Diverticulitis of large intestine without perforation or abscess without bleeding: Secondary | ICD-10-CM | POA: Diagnosis not present

## 2017-05-19 HISTORY — PX: SIGMOIDOSCOPY: SHX6686

## 2017-05-19 LAB — GLUCOSE, CAPILLARY
Glucose-Capillary: 108 mg/dL — ABNORMAL HIGH (ref 65–99)
Glucose-Capillary: 111 mg/dL — ABNORMAL HIGH (ref 65–99)

## 2017-05-19 SURGERY — SIGMOIDOSCOPY
Anesthesia: General | Wound class: Clean Contaminated

## 2017-05-19 MED ORDER — LIDOCAINE HCL (CARDIAC) 20 MG/ML IV SOLN
INTRAVENOUS | Status: DC | PRN
  Administered 2017-05-19: 50 mg via INTRATRACHEAL

## 2017-05-19 MED ORDER — FAMOTIDINE 20 MG PO TABS
ORAL_TABLET | ORAL | Status: AC
  Administered 2017-05-19: 20 mg via ORAL
  Filled 2017-05-19: qty 1

## 2017-05-19 MED ORDER — SODIUM CHLORIDE 0.9 % IV SOLN
INTRAVENOUS | Status: DC
  Administered 2017-05-19: 09:00:00 via INTRAVENOUS

## 2017-05-19 MED ORDER — FAMOTIDINE 20 MG PO TABS
20.0000 mg | ORAL_TABLET | Freq: Once | ORAL | Status: AC
  Administered 2017-05-19: 20 mg via ORAL

## 2017-05-19 MED ORDER — PROPOFOL 10 MG/ML IV BOLUS
INTRAVENOUS | Status: AC
  Filled 2017-05-19: qty 20

## 2017-05-19 MED ORDER — MIDAZOLAM HCL 2 MG/2ML IJ SOLN
INTRAMUSCULAR | Status: AC
  Filled 2017-05-19: qty 2

## 2017-05-19 MED ORDER — CHLORHEXIDINE GLUCONATE CLOTH 2 % EX PADS: 6.0000 | MEDICATED_PAD | Freq: Once | CUTANEOUS | Status: DC

## 2017-05-19 MED ORDER — PROPOFOL 500 MG/50ML IV EMUL
INTRAVENOUS | Status: DC | PRN
  Administered 2017-05-19: 150 ug/kg/min via INTRAVENOUS

## 2017-05-19 MED ORDER — MIDAZOLAM HCL 2 MG/2ML IJ SOLN
INTRAMUSCULAR | Status: DC | PRN
  Administered 2017-05-19: 2 mg via INTRAVENOUS

## 2017-05-19 MED ORDER — FLEET ENEMA 7-19 GM/118ML RE ENEM
1.0000 | ENEMA | Freq: Once | RECTAL | Status: AC
  Administered 2017-05-19: 1 via RECTAL

## 2017-05-19 MED ORDER — FLEET ENEMA 7-19 GM/118ML RE ENEM: 1.0000 | ENEMA | Freq: Once | RECTAL | Status: DC

## 2017-05-19 MED ORDER — PROPOFOL 10 MG/ML IV BOLUS
INTRAVENOUS | Status: DC | PRN
  Administered 2017-05-19: 50 mg via INTRAVENOUS

## 2017-05-19 MED ORDER — PHENYLEPHRINE HCL 10 MG/ML IJ SOLN
INTRAMUSCULAR | Status: AC
  Filled 2017-05-19: qty 1

## 2017-05-19 MED ORDER — LIDOCAINE HCL (PF) 2 % IJ SOLN
INTRAMUSCULAR | Status: AC
  Filled 2017-05-19: qty 10

## 2017-05-19 SURGICAL SUPPLY — 19 items
DRAPE LAPAROTOMY 100X77 ABD (DRAPES) ×3 IMPLANT
DRAPE LEGGINS SURG 28X43 STRL (DRAPES) ×3 IMPLANT
DRAPE UNDER BUTTOCK W/FLU (DRAPES) ×3 IMPLANT
ELECT CAUTERY BLADE 6.4 (BLADE) ×3 IMPLANT
ELECT REM PT RETURN 9FT ADLT (ELECTROSURGICAL) ×3
ELECTRODE REM PT RTRN 9FT ADLT (ELECTROSURGICAL) ×1 IMPLANT
GLOVE BIO SURGEON STRL SZ7.5 (GLOVE) ×3 IMPLANT
GLOVE INDICATOR 8.0 STRL GRN (GLOVE) ×3 IMPLANT
GOWN STRL REUS W/ TWL LRG LVL3 (GOWN DISPOSABLE) ×2 IMPLANT
GOWN STRL REUS W/TWL LRG LVL3 (GOWN DISPOSABLE) ×4
KIT RM TURNOVER CYSTO AR (KITS) ×3 IMPLANT
LABEL OR SOLS (LABEL) ×3 IMPLANT
PACK BASIN MINOR ARMC (MISCELLANEOUS) ×3 IMPLANT
PAD OB MATERNITY 4.3X12.25 (PERSONAL CARE ITEMS) ×3 IMPLANT
PAD PREP 24X41 OB/GYN DISP (PERSONAL CARE ITEMS) ×3 IMPLANT
SOL PREP PVP 2OZ (MISCELLANEOUS) ×3
SOLUTION PREP PVP 2OZ (MISCELLANEOUS) ×1 IMPLANT
SPONGE XRAY 4X4 16PLY STRL (MISCELLANEOUS) ×3 IMPLANT
SURGILUBE 2OZ TUBE FLIPTOP (MISCELLANEOUS) ×3 IMPLANT

## 2017-05-19 NOTE — Brief Op Note (Signed)
05/19/2017  10:45 AM  PATIENT:  Kara [name]  66 y.o. female  PRE-OPERATIVE DIAGNOSIS:  ILEOSTOMY IN PLACE  POST-OPERATIVE DIAGNOSIS:  ILEOSTOMY IN PLACE  PROCEDURE:  Procedure(s): FLEXIBLE SIGMOIDOSCOPY (N/A)  SURGEON:  Surgeon(s) and Role:    * [name], MD - Primary  PHYSICIAN ASSISTANT:   ASSISTANTS: none   ANESTHESIA:   IV sedation  EBL:  None    BLOOD ADMINISTERED:none  DRAINS: none   LOCAL MEDICATIONS USED:  NONE  SPECIMEN:  No Specimen  DISPOSITION OF SPECIMEN:  N/A  COUNTS:  YES  TOURNIQUET:  * No tourniquets in log *  DICTATION: .Dragon Dictation  PLAN OF CARE: Discharge to home after PACU  PATIENT DISPOSITION:  PACU - hemodynamically stable.   Delay start of Pharmacological VTE agent (>24hrs) due to surgical blood loss or risk of bleeding: no

## 2017-05-19 NOTE — Anesthesia Preprocedure Evaluation (Signed)
Anesthesia Evaluation  Patient identified by MRN, date of birth, ID band Patient awake    Reviewed: Allergy & Precautions, NPO status , Patient's Chart, lab work & pertinent test results  History of Anesthesia Complications Negative for: history of anesthetic complications  Airway Mallampati: I       Dental   Pulmonary neg sleep apnea, neg COPD,           Cardiovascular hypertension, Pt. 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 No date: Diabetes ([place]) No date: Diverticulitis No date: Hypertension No date: PE (pulmonary thromboembolism) ([place]) No date: 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[place])   Reproductive/Obstetrics                             Anesthesia Physical  Anesthesia Plan  ASA: III  Anesthesia Plan: General   Post-op Pain Management:    Induction: Intravenous  PONV Risk Score and Plan: 3 and Treatment may vary due to age or medical condition and Propofol infusion  Airway Management Planned: Simple Face Mask  Additional Equipment:   Intra-op Plan:   Post-operative Plan:   Informed Consent: I have reviewed the patients History and Physical, chart, labs and discussed the procedure including the risks, benefits and alternatives for the proposed anesthesia with the patient or authorized representative who has indicated his/her understanding and acceptance.     Plan Discussed with:   Anesthesia Plan Comments:         Anesthesia Quick Evaluation

## 2017-05-19 NOTE — Op Note (Signed)
   Pre-operative Diagnosis: Ileostomy in place  Post-operative Diagnosis: Pelvic anastomosis  Procedure performed: Flexible sigmoidoscopy  Surgeon: [name]   Assistants: None  Anesthesia: IV sedation  ASA Class: 2  Surgeon: [name], MD FACS  Anesthesia: Gen. with endotracheal tube  Assistant:none   Procedure Details  The patient was seen again in the Holding Room. The benefits, complications, treatment options, and expected outcomes were discussed with the patient. The risks of bleeding, infection, recurrence of symptoms, failure to resolve symptoms,  bowel injury, any of which could require further surgery were reviewed with the patient.   The patient was taken to Operating Room, identified as Kara [name] and the procedure verified.  A Time Out was held and the above information confirmed.  Prior to the induction of sedation, a digital rectal exam was performed which showed no perianal disease.  A flexible sigmoidoscope was able to then be inserted into the rectum which was able to easily visualize the previously placed staples and sutures from the patient's anastomosis.  A blind pouch was seen going beyond it with a staple line that was intact.  Numerous attempts were made to proceed beyond the anastomosis without success however the entirety of the colon that was proximal to the anastomosis was able to be visualized.  The sigmoidoscope was then retracted from the patient's rectum without any difficulty or obvious complication.  There were no specimens or pictures taken.  Findings: Pelvic anastomosis that is intact without any evidence of perforation   Estimated Blood Loss: None         Drains: None         Specimens: None          Complications: None                  Condition: Good   [name], MD, FACS

## 2017-05-19 NOTE — Anesthesia Post-op Follow-up Note (Signed)
Anesthesia QCDR form completed.        

## 2017-05-19 NOTE — Telephone Encounter (Signed)
Patients calling asking for a refill on her pain medication she said she needed the 20 pill one. Please call patient and advise.

## 2017-05-19 NOTE — Transfer of Care (Signed)
Immediate Anesthesia Transfer of Care Note  Patient: WM FRUCHTER  Procedure(s) Performed: FLEXIBLE SIGMOIDOSCOPY (N/A )  Patient Location: PACU  Anesthesia Type:General  Level of Consciousness: sedated  Airway & Oxygen Therapy: Patient Spontanous Breathing and Patient connected to face mask oxygen  Post-op Assessment: Report given to RN and Post -op Vital signs reviewed and stable  Post vital signs: Reviewed and stable  Last Vitals:  Vitals:   05/19/17 0759 05/19/17 1047  BP: (!) 125/59 111/60  Pulse: 80 84  Resp: 18 20  Temp: 36.6 C   SpO2: 98% 99%    Last Pain:  Vitals:   05/19/17 0759  TempSrc: Oral  PainSc: 8          Complications: No apparent anesthesia complications

## 2017-05-19 NOTE — Interval H&P Note (Signed)
History and Physical Interval Note:  05/19/2017 8:53 AM  Kara [name]  has presented today for surgery, with the diagnosis of ILEOSTOMY IN PLACE  The various methods of treatment have been discussed with the patient and family. After consideration of risks, benefits and other options for treatment, the patient has consented to  Procedure(s): FLEXIBLE SIGMOIDOSCOPY (N/A) as a surgical intervention .  The patient's history has been reviewed, patient examined, no change in status, stable for surgery.  I have reviewed the patient's chart and labs.  Questions were answered to the patient's satisfaction.     [name]

## 2017-05-19 NOTE — Anesthesia Postprocedure Evaluation (Signed)
Anesthesia Post Note  Patient: Kara [name]  Procedure(s) Performed: FLEXIBLE SIGMOIDOSCOPY (N/A )  Patient location during evaluation: PACU Anesthesia Type: General Level of consciousness: awake and alert Pain management: pain level controlled Vital Signs Assessment: post-procedure vital signs reviewed and stable Respiratory status: spontaneous breathing, nonlabored ventilation, respiratory function stable and patient connected to nasal cannula oxygen Cardiovascular status: blood pressure returned to baseline and stable Postop Assessment: no apparent nausea or vomiting Anesthetic complications: no     Last Vitals:  Vitals:   05/19/17 1130 05/19/17 1145  BP: 138/65 132/65  Pulse: 72 69  Resp:    Temp:    SpO2: 100% 100%    Last Pain:  Vitals:   05/19/17 1047  TempSrc:   PainSc: Asleep                 Martha Clan

## 2017-05-19 NOTE — Discharge Instructions (Signed)
Flexible Sigmoidoscopy, Care After  This sheet gives you information about how to care for yourself after your procedure. Your health care provider may also give you more specific instructions. If you have problems or questions, contact your health care provider.  What can I expect after the procedure?  After the procedure, it is common to have:  · Abdominal cramping or pain.  · Bloating.  · A small amount of rectal bleeding if you had a biopsy.    Follow these instructions at home:  · Take over-the-counter and prescription medicines only as told by your health care provider.  · Do not drive for 24 hours if you received a medicine to help you relax (sedative).  · Keep all follow-up visits as told by your health care provider. This is important.  Contact a health care provider if:  · You have abdominal pain or cramping that gets worse or is not helped with medicine.  · You continue to have small amounts of rectal bleeding after 24 hours.  · You have nausea or vomiting.  · You feel weak or dizzy.  · You have a fever.  Get help right away if:  · You pass large blood clots or see a large amount of blood in the toilet after having a bowel movement.  · You have nausea or vomiting for more than 24 hours after the procedure.  This information is not intended to replace advice given to you by your health care provider. Make sure you discuss any questions you have with your health care provider.  Document Released: 05/15/2013 Document Revised: 11/28/2015 Document Reviewed: 08/09/2015  Elsevier Interactive Patient Education © 2018 Elsevier Inc.

## 2017-05-19 NOTE — Anesthesia Procedure Notes (Signed)
Performed by: Lance Muss, CRNA Pre-anesthesia Checklist: Patient identified, Emergency Drugs available, Suction available, Patient being monitored and Timeout performed Patient Re-evaluated:Patient Re-evaluated prior to induction Oxygen Delivery Method: Simple face mask Induction Type: IV induction

## 2017-05-20 ENCOUNTER — Encounter: Payer: Self-pay | Admitting: General Surgery

## 2017-05-20 NOTE — Telephone Encounter (Signed)
Patient arrived at office around 11:45am. I advised her that I spoke with the in clinic provider, Dr. Dahlia Byes, at this time and he suggested that the patient try over the counter tylenol and ibuprofen until seen by Dr. Adonis Huguenin on 05/25/17. Patient verbalized understanding.

## 2017-05-20 NOTE — Telephone Encounter (Signed)
Patient's is calling again asking about her prescription. please call patient and advise.

## 2017-05-25 ENCOUNTER — Ambulatory Visit (INDEPENDENT_AMBULATORY_CARE_PROVIDER_SITE_OTHER): Payer: PPO | Admitting: General Surgery

## 2017-05-25 ENCOUNTER — Encounter: Payer: Self-pay | Admitting: General Surgery

## 2017-05-25 VITALS — BP 137/83 | HR 80 | Temp 97.6°F | Ht 63.0 in | Wt 213.8 lb

## 2017-05-25 DIAGNOSIS — Z932 Ileostomy status: Secondary | ICD-10-CM

## 2017-05-25 MED ORDER — ERYTHROMYCIN BASE 500 MG PO TABS: 1000.0000 mg | ORAL_TABLET | Freq: Three times a day (TID) | ORAL | 0 refills | Status: DC

## 2017-05-25 MED ORDER — OXYCODONE-ACETAMINOPHEN 7.5-325 MG PO TABS: 1.0000 | ORAL_TABLET | Freq: Four times a day (QID) | ORAL | 0 refills | Status: DC | PRN

## 2017-05-25 MED ORDER — BISACODYL 5 MG PO TBEC: 5.0000 mg | DELAYED_RELEASE_TABLET | Freq: Once | ORAL | 0 refills | Status: AC

## 2017-05-25 MED ORDER — POLYETHYLENE GLYCOL 3350 17 GM/SCOOP PO POWD: 1.0000 | Freq: Once | ORAL | 0 refills | Status: AC

## 2017-05-25 NOTE — Patient Instructions (Addendum)
We have your surgery scheduled for 06/01/17 at [facility] with Dr.Woodham.  Please see your blue pre-care sheet for surgery information.

## 2017-05-25 NOTE — Progress Notes (Signed)
Outpatient Surgical Follow Up  05/25/2017  Kara [name] is an 67 y.o. female.   Chief Complaint  Patient presents with  . Routine Post Op    Flexible Sigmoidoscopy Dr.Sherle Mello 05/19/17    HPI: 67 year old female returns to clinic for follow-up from recent sigmoidoscopy that was performed to visualize her anastomosis.  Patient tolerated the procedure well.  Continues have occasional twinges of pain around her loop ileostomy mostly from skin irritation secondary to occasional leaking.  She otherwise states she has had no changes in her health.  She is eating well and having good ostomy output.  She denies any fevers, chills, nausea, vomiting, chest pain, shortness of breath, diarrhea, constipation.  She is here today to discuss getting her loop ileostomy taken down.  Past Medical History:  Diagnosis Date  . 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 Diabetes ([place])   . Diverticulitis   . Hypertension   . PE (pulmonary thromboembolism) ([place])   . 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[facility])     Past Surgical History:  Procedure Laterality Date  . APPENDECTOMY N/A 02/24/2017   Procedure: Incidental  APPENDECTOMY;  Surgeon: [name], MD;  Location: [name] ORS;  Service: General;  Laterality: N/A;  . CARPAL TUNNEL RELEASE Bilateral   . COLON RESECTION SIGMOID N/A 11/02/2016   Procedure: COLON RESECTION SIGMOID;  Surgeon: [name], MD;  Location: [name] ORS;  Service: General;  Laterality: N/A;  . COLON SURGERY  11/02/2016  . COLONOSCOPY WITH PROPOFOL N/A 02/03/2017   Procedure: COLONOSCOPY WITH PROPOFOL;  Surgeon: Lin Landsman, MD;  Location: [facility] ENDOSCOPY;  Service: Gastroenterology;  Laterality: N/A;  . COLOSTOMY N/A 11/02/2016   Procedure: COLOSTOMY;  Surgeon: [name], MD;  Location: [name] ORS;  Service: General;  Laterality: N/A;  . COLOSTOMY REVERSAL N/A 02/24/2017   Procedure: COLOSTOMY REVERSAL, ostomy takedown, spleenic  flexure mobilization, excision rectal stump/distal sigmoid, anastomosis with suture reinforcement;  Surgeon: [name], MD;  Location: [name] ORS;  Service: General;  Laterality: N/A;  . ILEO LOOP DIVERSION N/A 02/24/2017   Procedure: ILEO LOOP COLOSTOMY;  Surgeon: [name], MD;  Location: [name] ORS;  Service: General;  Laterality: N/A;  . IVC FILTER INSERTION Right 10/2016  . KNEE SURGERY     torn menicus  . LAPAROSCOPY N/A 02/24/2017   Procedure: LAPAROSCOPY DIAGNOSTIC;  Surgeon: [name], MD;  Location: [name] ORS;  Service: General;  Laterality: N/A;  . LYSIS OF ADHESION N/A 02/24/2017   Procedure: LYSIS OF ADHESION;  Surgeon: [name], MD;  Location: [name] ORS;  Service: General;  Laterality: N/A;  . PULMONARY VENOGRAPHY N/A 11/19/2016   Procedure: Pulmonary Venography; IVC filter placement; possible pulmonary thrombectomy;  Surgeon: Katha Cabal, MD;  Location: [place] CV LAB;  Service: Cardiovascular;  Laterality: N/A;  . SIGMOIDOSCOPY N/A 11/13/2016   Procedure: endoscopic  flexible SIGMOIDOSCOPY;  Surgeon: Florene Glen, MD;  Location: [name] ORS;  Service: General;  Laterality: N/A;  . SIGMOIDOSCOPY N/A 05/19/2017   Procedure: FLEXIBLE SIGMOIDOSCOPY;  Surgeon: [name], MD;  Location: [name] ORS;  Service: General;  Laterality: N/A;    Family History  Problem Relation Age of Onset  . Diabetes Mother   . Heart disease Mother   . Cancer Mother   . Heart disease Father   . Diabetes Sister   . Heart disease Sister     Social History:  reports that  has never smoked. she has never used smokeless tobacco.  She reports that she does not drink alcohol or use drugs.  Allergies:  Allergies  Allergen Reactions  . Adhesive [Tape] Other (See Comments)    Pt reports, when removing tape from skin most tapes pull her skin off. Ok to use paper tape  . Morphine Nausea Only  . Other Hives and Other (See Comments)    Chlorhexadine wipes/CHG wipes Pt  reports, when removing tape from skin most tapes pull her skin off. Ok to use paper tape  . Latex Rash  . Morphine And Related Nausea And Vomiting    Medications reviewed.    ROS A multipoint review of systems was completed, all pertinent positives and negatives are documented within the HPI and the remainder are negative.   BP 137/83   Pulse 80   Temp 97.6 F (36.4 C) (Oral)   Ht 5\' 3"  (1.6 m)   Wt 97 kg (213 lb 12.8 oz)   BMI 37.87 kg/m   Physical Exam General: No acute distress Neck: Supple nontender Chest: Clear to auscultation Heart: Regular rate and rhythm Abdomen: Large, soft, nontender, nondistended.  Loop ileostomy in the right lower quadrant that is pink, patent, productive of liquid stool.  There is some skin irritation around her ostomy appliance. Extremities: Moves all extremities well.    No results found for this or any previous visit (from the past 48 hour(s)). No results found.  Assessment/Plan:  1. Ileostomy in place [facility]) 67 year old female with a loop ileostomy in place.  Discussed the operative findings from a flexible sigmoidoscopy in detail.  Specifically discussed that I was unable to traverse the anastomosis but I was able to visualize the entirety of the descending colon.  Discussed that this increases the risk of potential blockage at the site of the anastomosis and increases the risk for needing further operative intervention to prevent a obstruction.  Discussed the approximate odds of it happening or not happening.  Provided her with the option of a loop ileostomy takedown excepting the potential risk of symptom medic stricture versus attempted endoscopic dilatation prior to taking it down.  After this discussion patient states she desires to have the ileostomy taken down and that she will accept the increased risk.  The procedure of the loop ileostomy takedown was described in detail to include the risk, benefits, alternatives.  Plan for operative  intervention next Wednesday, January 9.  A total of 25 minutes was used on this encounter with greater than 50% of it used for counseling or coordination of care.   [name], MD FACS General Surgeon  05/25/2017,9:09 AM

## 2017-05-25 NOTE — H&P (View-Only) (Signed)
Outpatient Surgical Follow Up  05/25/2017  Kara [name] is an 67 y.o. female.   Chief Complaint  Patient presents with  . Routine Post Op    Flexible Sigmoidoscopy Dr.Wael Maestas 05/19/17    HPI: 67 year old female returns to clinic for follow-up from recent sigmoidoscopy that was performed to visualize her anastomosis.  Patient tolerated the procedure well.  Continues have occasional twinges of pain around her loop ileostomy mostly from skin irritation secondary to occasional leaking.  She otherwise states she has had no changes in her health.  She is eating well and having good ostomy output.  She denies any fevers, chills, nausea, vomiting, chest pain, shortness of breath, diarrhea, constipation.  She is here today to discuss getting her loop ileostomy taken down.  Past Medical History:  Diagnosis Date  . 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 Diabetes ([place])   . Diverticulitis   . Hypertension   . PE (pulmonary thromboembolism) ([place])   . 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[name])     Past Surgical History:  Procedure Laterality Date  . APPENDECTOMY N/A 02/24/2017   Procedure: Incidental  APPENDECTOMY;  Surgeon: [name], MD;  Location: [name] ORS;  Service: General;  Laterality: N/A;  . CARPAL TUNNEL RELEASE Bilateral   . COLON RESECTION SIGMOID N/A 11/02/2016   Procedure: COLON RESECTION SIGMOID;  Surgeon: [name], MD;  Location: [name] ORS;  Service: General;  Laterality: N/A;  . COLON SURGERY  11/02/2016  . COLONOSCOPY WITH PROPOFOL N/A 02/03/2017   Procedure: COLONOSCOPY WITH PROPOFOL;  Surgeon: Lin Landsman, MD;  Location: [name] END[facility]COPY;  Service: Gastroenterology;  Laterality: N/A;  . COLOSTOMY N/A 11/02/2016   Procedure: COLOSTOMY;  Surgeon: [name], MD;  Location: [name] ORS;  Service: General;  Laterality: N/A;  . COLOSTOMY REVERSAL N/A 02/24/2017   Procedure: COLOSTOMY REVERSAL, ostomy takedown, spleenic  flexure mobilization, excision rectal stump/distal sigmoid, anastomosis with suture reinforcement;  Surgeon: [name], MD;  Location: [name] ORS;  Service: General;  Laterality: N/A;  . ILEO LOOP DIVERSION N/A 02/24/2017   Procedure: ILEO LOOP COLOSTOMY;  Surgeon: [name], MD;  Location: [name] ORS;  Service: General;  Laterality: N/A;  . IVC FILTER INSERTION Right 10/2016  . KNEE SURGERY     torn menicus  . LAPAROSCOPY N/A 02/24/2017   Procedure: LAPAROSCOPY DIAGNOSTIC;  Surgeon: [name], MD;  Location: [name] ORS;  Service: General;  Laterality: N/A;  . LYSIS OF ADHESION N/A 02/24/2017   Procedure: LYSIS OF ADHESION;  Surgeon: [name], MD;  Location: [name] ORS;  Service: General;  Laterality: N/A;  . PULMONARY VENOGRAPHY N/A 11/19/2016   Procedure: Pulmonary Venography; IVC filter placement; possible pulmonary thrombectomy;  Surgeon: Katha Cabal, MD;  Location: [place] CV LAB;  Service: Cardiovascular;  Laterality: N/A;  . SIGMOIDOSCOPY N/A 11/13/2016   Procedure: endoscopic  flexible SIGMOIDOSCOPY;  Surgeon: Florene Glen, MD;  Location: [name] ORS;  Service: General;  Laterality: N/A;  . SIGMOIDOSCOPY N/A 05/19/2017   Procedure: FLEXIBLE SIGMOIDOSCOPY;  Surgeon: [name], MD;  Location: [name] ORS;  Service: General;  Laterality: N/A;    Family History  Problem Relation Age of Onset  . Diabetes Mother   . Heart disease Mother   . Cancer Mother   . Heart disease Father   . Diabetes Sister   . Heart disease Sister     Social History:  reports that  has never smoked. she has never used smokeless tobacco.  She reports that she does not drink alcohol or use drugs.  Allergies:  Allergies  Allergen Reactions  . Adhesive [Tape] Other (See Comments)    Pt reports, when removing tape from skin most tapes pull her skin off. Ok to use paper tape  . Morphine Nausea Only  . Other Hives and Other (See Comments)    Chlorhexadine wipes/CHG wipes Pt  reports, when removing tape from skin most tapes pull her skin off. Ok to use paper tape  . Latex Rash  . Morphine And Related Nausea And Vomiting    Medications reviewed.    ROS A multipoint review of systems was completed, all pertinent positives and negatives are documented within the HPI and the remainder are negative.   BP 137/83   Pulse 80   Temp 97.6 F (36.4 C) (Oral)   Ht 5\' 3"  (1.6 m)   Wt 97 kg (213 lb 12.8 oz)   BMI 37.87 kg/m   Physical Exam General: No acute distress Neck: Supple nontender Chest: Clear to auscultation Heart: Regular rate and rhythm Abdomen: Large, soft, nontender, nondistended.  Loop ileostomy in the right lower quadrant that is pink, patent, productive of liquid stool.  There is some skin irritation around her ostomy appliance. Extremities: Moves all extremities well.    No results found for this or any previous visit (from the past 48 hour(s)). No results found.  Assessment/Plan:  1. Ileostomy in place [name]) 67 year old female with a loop ileostomy in place.  Discussed the operative findings from a flexible sigmoidoscopy in detail.  Specifically discussed that I was unable to traverse the anastomosis but I was able to visualize the entirety of the descending colon.  Discussed that this increases the risk of potential blockage at the site of the anastomosis and increases the risk for needing further operative intervention to prevent a obstruction.  Discussed the approximate odds of it happening or not happening.  Provided her with the option of a loop ileostomy takedown excepting the potential risk of symptom medic stricture versus attempted endoscopic dilatation prior to taking it down.  After this discussion patient states she desires to have the ileostomy taken down and that she will accept the increased risk.  The procedure of the loop ileostomy takedown was described in detail to include the risk, benefits, alternatives.  Plan for operative  intervention next Wednesday, January 9.  A total of 25 minutes was used on this encounter with greater than 50% of it used for counseling or coordination of care.   [name], MD FACS General Surgeon  05/25/2017,9:09 AM

## 2017-05-26 ENCOUNTER — Other Ambulatory Visit: Payer: Self-pay

## 2017-05-26 ENCOUNTER — Telehealth: Payer: Self-pay | Admitting: General Surgery

## 2017-05-26 ENCOUNTER — Encounter
Admission: RE | Admit: 2017-05-26 | Discharge: 2017-05-26 | Disposition: A | Discharge status: Home / Self Care | Payer: PPO | Source: Ambulatory Visit | Attending: General Surgery | Admitting: General Surgery

## 2017-05-26 ENCOUNTER — Encounter: Payer: PPO | Admitting: Surgery

## 2017-05-26 DIAGNOSIS — Z932 Ileostomy status: Secondary | ICD-10-CM | POA: Diagnosis not present

## 2017-05-26 DIAGNOSIS — Z01818 Encounter for other preprocedural examination: Secondary | ICD-10-CM | POA: Diagnosis not present

## 2017-05-26 HISTORY — DX: Angina pectoris, unspecified: I20.9

## 2017-05-26 HISTORY — DX: Heart failure, unspecified: I50.9

## 2017-05-26 LAB — CBC
HEMATOCRIT: 42.3 % (ref 35.0–47.0)
HEMOGLOBIN: 14.1 g/dL (ref 12.0–16.0)
MCH: 30.1 pg (ref 26.0–34.0)
MCHC: 33.3 g/dL (ref 32.0–36.0)
MCV: 90.5 fL (ref 80.0–100.0)
Platelets: 225 10*3/uL (ref 150–440)
RBC: 4.67 MIL/uL (ref 3.80–5.20)
RDW: 14.2 % (ref 11.5–14.5)
WBC: 6.4 10*3/uL (ref 3.6–11.0)

## 2017-05-26 LAB — SURGICAL PCR SCREEN
MRSA, PCR: POSITIVE — AB
STAPHYLOCOCCUS AUREUS: POSITIVE — AB

## 2017-05-26 MED ORDER — CHLORHEXIDINE GLUCONATE CLOTH 2 % EX PADS
6.0000 | MEDICATED_PAD | Freq: Once | CUTANEOUS | Status: DC
  Filled 2017-05-26: qty 6

## 2017-05-26 NOTE — Telephone Encounter (Signed)
Pt advised of pre op date/time and sx date. Sx: 06/01/17 with Dr Jolene Provost ileostomy takedown.  Pre op: 05/26/17 @ 1:00pm--office interview.   Patient made aware to call [phone], between 1-3:00pm the day before surgery, to find out what time to arrive.

## 2017-05-26 NOTE — Pre-Procedure Instructions (Signed)
Called Dr Ronelle Nigh regarding request for anesthesia consult.  "With patient having an IVC filter and being cleared by Dr Ubaldo Glassing tomorrow. "Everything should be OK."

## 2017-05-26 NOTE — Patient Outreach (Signed)
[facility]) Care Management  05/26/2017  YUKA LALLIER 1951/05/15 [id number]  TELEPHONE SCREENING Referral date: 05/19/17 Referral source: Health team advantage referral Referral reason: Difficulty obtaining ostomy supplies Insurance: Health team advantage Attempt #1  Telephone call to patient regarding health team advantage referral. Unable to reach patient. HIPAA compliant voice message left with call back phone number.   PLAN:  RNCM will attempt 2nd telephone call to patient within 3 business days.   [name] RN,BSN,CCM [facility] Telephonic  [phone]

## 2017-05-26 NOTE — Patient Instructions (Signed)
Your procedure is scheduled on: 06/01/17 Wed Report to Same Day Surgery 2nd floor medical mall [facility] Entrance-take elevator on left to 2nd floor.  Check in with surgery information desk.) To find out your arrival time please call [phone] between 1PM - 3PM on 05/31/17 Tues  Remember: Instructions that are not followed completely may result in serious medical risk, up to and including death, or upon the discretion of your surgeon and anesthesiologist your surgery may need to be rescheduled.    _x___ 1. Do not eat food after midnight the night before your procedure. You may drink clear liquids up to 2 hours before you are scheduled to arrive at the hospital for your procedure.  Do not drink clear liquids within 2 hours of your scheduled arrival to the hospital.  Clear liquids include  --Water or Apple juice without pulp  --Clear carbohydrate beverage such as ClearFast or Gatorade  --Black Coffee or Clear Tea (No milk, no creamers, do not add anything to                  the coffee or Tea Type 1 and type 2 diabetics should only drink water.  No gum chewing or hard candies.     __x__ 2. No Alcohol for 24 hours before or after surgery.   __x__3. No Smoking for 24 prior to surgery.   ____  4. Bring all medications with you on the day of surgery if instructed.    __x__ 5. Notify your doctor if there is any change in your medical condition     (cold, fever, infections).     Do not wear jewelry, make-up, hairpins, clips or nail polish.  Do not wear lotions, powders, or perfumes. You may wear deodorant.  Do not shave 48 hours prior to surgery. Men may shave face and neck.  Do not bring valuables to the hospital.    [facility] is not responsible for any belongings or valuables.               Contacts, dentures or bridgework may not be worn into surgery.  Leave your suitcase in the car. After surgery it may be brought to your room.  For patients admitted to the hospital, discharge  time is determined by your                       treatment team.   Patients discharged the day of surgery will not be allowed to drive home.  You will need someone to drive you home and stay with you the night of your procedure.    Please read over the following fact sheets that you were given:   [facility] Preparing for Surgery and or MRSA Information   _x___ Take anti-hypertensive listed below, cardiac, seizure, asthma,     anti-reflux and psychiatric medicines. These include:  1. oxyCODONE-acetaminophen (PERCOCET) 7.5-325 MG tablet if needed  2.  3.  4.  5.  6.  ____Fleets enema or Magnesium Citrate as directed.   _x___ Use CHG Soap or sage wipes as directed on instruction sheet   ____ Use inhalers on the day of surgery and bring to hospital day of surgery  ____ Stop Metformin and Janumet 2 days prior to surgery.    ____ Take 1/2 of usual insulin dose the night before surgery and none on the morning     surgery.   _x___ Follow recommendations from Cardiologist, Pulmonologist or PCP regarding  stopping Aspirin, Coumadin, Plavix ,Eliquis, Effient, or Pradaxa, and Pletal.  Stopped Aspirin on 05/25/17 Wed  X____Stop Anti-inflammatories such as Advil, Aleve, Ibuprofen, Motrin, Naproxen, Naprosyn, Goodies powders or aspirin products. OK to take Tylenol and                          Celebrex.   _x___ Stop supplements until after surgery.  But may continue Vitamin D, Vitamin B,       and multivitamin.   ____ Bring C-Pap to the hospital.

## 2017-05-26 NOTE — Pre-Procedure Instructions (Signed)
Questioned the office regarding need for bowel prep.  "No bowel prep needed." per Dr Reginal Lutes office.

## 2017-05-27 DIAGNOSIS — I10 Essential (primary) hypertension: Secondary | ICD-10-CM | POA: Diagnosis not present

## 2017-05-27 DIAGNOSIS — I2699 Other pulmonary embolism without acute cor pulmonale: Secondary | ICD-10-CM | POA: Diagnosis not present

## 2017-05-27 DIAGNOSIS — E119 Type 2 diabetes mellitus without complications: Secondary | ICD-10-CM | POA: Diagnosis not present

## 2017-05-30 ENCOUNTER — Other Ambulatory Visit: Payer: Self-pay

## 2017-05-30 NOTE — Patient Outreach (Signed)
[facility]) Care Management  05/30/2017  KYARRA VANCAMP [birth year] [id number]   TELEPHONE SCREENING Referral date: 05/19/17 Referral source: Health team advantage referral Referral reason: needs assistance with ostomy supplies Insurance: Health team advantage.   Telephone call to patient regarding health team advantage referral. HIPAA verified with patient. Discussed and offered [facility] care management services to patient. Patient refused services. Patient states she will be able to get new supplies on 06/02/17.  Patient states she does not need help with this at this time.   RNCM offered [facility] care management case management services to patient. Patient refused. Patient verbally agreed to receive [facility] care management brochure for future reference.   PLAN: RNCM will refer patient to care management assistant to close due to patient refusing services.  RNCM will notify patients primary MD of closure.  RNCM will send patient [facility] care management brochure.  [name] RN,BSN,CCM [facility] Telephonic  [phone]

## 2017-05-31 MED ORDER — CLINDAMYCIN PHOSPHATE 900 MG/50ML IV SOLN
900.0000 mg | INTRAVENOUS | Status: AC
  Administered 2017-06-01: 900 mg via INTRAVENOUS

## 2017-06-01 ENCOUNTER — Inpatient Hospital Stay
Admission: RE | Admit: 2017-06-01 | Discharge: 2017-06-03 | DRG: 331 | Disposition: A | Discharge status: Home / Self Care | Payer: PPO | Attending: General Surgery | Admitting: General Surgery

## 2017-06-01 ENCOUNTER — Encounter
Admission: RE | Disposition: A | Discharge status: Home / Self Care | Payer: Self-pay | Source: Home / Self Care | Attending: General Surgery

## 2017-06-01 ENCOUNTER — Inpatient Hospital Stay: Payer: PPO | Admitting: Registered Nurse

## 2017-06-01 ENCOUNTER — Telehealth: Payer: Self-pay

## 2017-06-01 DIAGNOSIS — Z91040 Latex allergy status: Secondary | ICD-10-CM

## 2017-06-01 DIAGNOSIS — Z980 Intestinal bypass and anastomosis status: Secondary | ICD-10-CM

## 2017-06-01 DIAGNOSIS — Z9109 Other allergy status, other than to drugs and biological substances: Secondary | ICD-10-CM

## 2017-06-01 DIAGNOSIS — E119 Type 2 diabetes mellitus without complications: Secondary | ICD-10-CM | POA: Diagnosis not present

## 2017-06-01 DIAGNOSIS — Z9889 Other specified postprocedural states: Secondary | ICD-10-CM

## 2017-06-01 DIAGNOSIS — Z432 Encounter for attention to ileostomy: Principal | ICD-10-CM

## 2017-06-01 DIAGNOSIS — Z86711 Personal history of pulmonary embolism: Secondary | ICD-10-CM | POA: Diagnosis not present

## 2017-06-01 DIAGNOSIS — I5023 Acute on chronic systolic (congestive) heart failure: Secondary | ICD-10-CM | POA: Diagnosis not present

## 2017-06-01 DIAGNOSIS — I10 Essential (primary) hypertension: Secondary | ICD-10-CM | POA: Diagnosis present

## 2017-06-01 DIAGNOSIS — M4690 Unspecified inflammatory spondylopathy, site unspecified: Secondary | ICD-10-CM | POA: Diagnosis present

## 2017-06-01 DIAGNOSIS — Z8249 Family history of ischemic heart disease and other diseases of the circulatory system: Secondary | ICD-10-CM | POA: Diagnosis not present

## 2017-06-01 DIAGNOSIS — I110 Hypertensive heart disease with heart failure: Secondary | ICD-10-CM | POA: Diagnosis not present

## 2017-06-01 DIAGNOSIS — Z833 Family history of diabetes mellitus: Secondary | ICD-10-CM

## 2017-06-01 DIAGNOSIS — Z885 Allergy status to narcotic agent status: Secondary | ICD-10-CM | POA: Diagnosis not present

## 2017-06-01 HISTORY — PX: ILEOSTOMY CLOSURE: SHX1784

## 2017-06-01 LAB — GLUCOSE, CAPILLARY
GLUCOSE-CAPILLARY: 157 mg/dL — AB (ref 65–99)
GLUCOSE-CAPILLARY: 160 mg/dL — AB (ref 65–99)

## 2017-06-01 LAB — CREATININE, SERUM
Creatinine, Ser: 0.89 mg/dL (ref 0.44–1.00)
GFR calc Af Amer: >60 mL/min (ref >60)
GFR calc non Af Amer: >60 mL/min (ref >60)

## 2017-06-01 LAB — CBC
HCT: 41 % (ref 35.0–47.0)
Hemoglobin: 13.6 g/dL (ref 12.0–16.0)
MCH: 30.1 pg (ref 26.0–34.0)
MCHC: 33.1 g/dL (ref 32.0–36.0)
MCV: 90.9 fL (ref 80.0–100.0)
PLATELETS: 191 10*3/uL (ref 150–440)
RBC: 4.51 MIL/uL (ref 3.80–5.20)
RDW: 14 % (ref 11.5–14.5)
WBC: 12.6 10*3/uL — ABNORMAL HIGH (ref 3.6–11.0)

## 2017-06-01 SURGERY — CLOSURE, ILEOSTOMY
Anesthesia: General | Wound class: Clean Contaminated

## 2017-06-01 MED ORDER — HYDROMORPHONE HCL 1 MG/ML IJ SOLN
0.5000 mg | INTRAMUSCULAR | Status: DC | PRN
  Administered 2017-06-02: 0.5 mg via INTRAVENOUS
  Filled 2017-06-01: qty 0.5

## 2017-06-01 MED ORDER — FENTANYL CITRATE (PF) 100 MCG/2ML IJ SOLN: 25.0000 ug | INTRAMUSCULAR | Status: DC | PRN

## 2017-06-01 MED ORDER — MIDAZOLAM HCL 2 MG/2ML IJ SOLN
INTRAMUSCULAR | Status: AC
  Filled 2017-06-01: qty 2

## 2017-06-01 MED ORDER — ADULT MULTIVITAMIN W/MINERALS CH
1.0000 | ORAL_TABLET | Freq: Every day | ORAL | Status: DC
  Administered 2017-06-02 – 2017-06-03 (×2): 1 via ORAL
  Filled 2017-06-01 (×6): qty 1

## 2017-06-01 MED ORDER — SODIUM CHLORIDE 0.9 % IV SOLN
INTRAVENOUS | Status: DC
  Administered 2017-06-01: 09:00:00 via INTRAVENOUS

## 2017-06-01 MED ORDER — HYDRALAZINE HCL 20 MG/ML IJ SOLN: 10.0000 mg | INTRAMUSCULAR | Status: DC | PRN

## 2017-06-01 MED ORDER — LACTATED RINGERS IV SOLN
INTRAVENOUS | Status: AC
  Administered 2017-06-01: 13:00:00 via INTRAVENOUS

## 2017-06-01 MED ORDER — ONDANSETRON HCL 4 MG/2ML IJ SOLN: 4.0000 mg | Freq: Once | INTRAMUSCULAR | Status: DC | PRN

## 2017-06-01 MED ORDER — FENTANYL CITRATE (PF) 100 MCG/2ML IJ SOLN
25.0000 ug | INTRAMUSCULAR | Status: DC | PRN
  Administered 2017-06-01 (×4): 25 ug via INTRAVENOUS

## 2017-06-01 MED ORDER — LOSARTAN POTASSIUM 50 MG PO TABS
50.0000 mg | ORAL_TABLET | Freq: Every day | ORAL | Status: DC
  Administered 2017-06-02 – 2017-06-03 (×2): 50 mg via ORAL
  Filled 2017-06-01 (×2): qty 1

## 2017-06-01 MED ORDER — CLINDAMYCIN PHOSPHATE 900 MG/50ML IV SOLN
INTRAVENOUS | Status: AC
  Filled 2017-06-01: qty 50

## 2017-06-01 MED ORDER — KETOROLAC TROMETHAMINE 15 MG/ML IJ SOLN
15.0000 mg | Freq: Four times a day (QID) | INTRAMUSCULAR | Status: DC | PRN
  Administered 2017-06-02 (×3): 15 mg via INTRAVENOUS
  Filled 2017-06-01 (×4): qty 1

## 2017-06-01 MED ORDER — FENTANYL CITRATE (PF) 100 MCG/2ML IJ SOLN
INTRAMUSCULAR | Status: DC | PRN
  Administered 2017-06-01 (×4): 50 ug via INTRAVENOUS
  Administered 2017-06-01: 25 ug via INTRAVENOUS

## 2017-06-01 MED ORDER — ACETAMINOPHEN 10 MG/ML IV SOLN
INTRAVENOUS | Status: DC | PRN
  Administered 2017-06-01: 1000 mg via INTRAVENOUS

## 2017-06-01 MED ORDER — ACETAMINOPHEN 10 MG/ML IV SOLN
INTRAVENOUS | Status: AC
  Filled 2017-06-01: qty 100

## 2017-06-01 MED ORDER — PHENYLEPHRINE HCL 10 MG/ML IJ SOLN
INTRAMUSCULAR | Status: DC | PRN
  Administered 2017-06-01 (×3): 100 ug via INTRAVENOUS

## 2017-06-01 MED ORDER — ROCURONIUM BROMIDE 100 MG/10ML IV SOLN
INTRAVENOUS | Status: DC | PRN
  Administered 2017-06-01: 10 mg via INTRAVENOUS
  Administered 2017-06-01: 40 mg via INTRAVENOUS
  Administered 2017-06-01 (×2): 10 mg via INTRAVENOUS

## 2017-06-01 MED ORDER — KETOROLAC TROMETHAMINE 30 MG/ML IJ SOLN
INTRAMUSCULAR | Status: DC | PRN
  Administered 2017-06-01: 30 mg via INTRAVENOUS

## 2017-06-01 MED ORDER — ENOXAPARIN SODIUM 40 MG/0.4ML ~~LOC~~ SOLN
40.0000 mg | SUBCUTANEOUS | Status: DC
  Administered 2017-06-02 – 2017-06-03 (×2): 40 mg via SUBCUTANEOUS
  Filled 2017-06-01 (×2): qty 0.4

## 2017-06-01 MED ORDER — SUGAMMADEX SODIUM 200 MG/2ML IV SOLN
INTRAVENOUS | Status: AC
  Filled 2017-06-01: qty 2

## 2017-06-01 MED ORDER — ONDANSETRON HCL 4 MG/2ML IJ SOLN
INTRAMUSCULAR | Status: AC
  Filled 2017-06-01: qty 2

## 2017-06-01 MED ORDER — ONDANSETRON HCL 4 MG/2ML IJ SOLN: 4.0000 mg | Freq: Four times a day (QID) | INTRAMUSCULAR | Status: DC | PRN

## 2017-06-01 MED ORDER — FAMOTIDINE 20 MG PO TABS
ORAL_TABLET | ORAL | Status: AC
  Filled 2017-06-01: qty 1

## 2017-06-01 MED ORDER — ONDANSETRON HCL 4 MG/2ML IJ SOLN
INTRAMUSCULAR | Status: DC | PRN
  Administered 2017-06-01 (×2): 4 mg via INTRAVENOUS

## 2017-06-01 MED ORDER — FENTANYL CITRATE (PF) 100 MCG/2ML IJ SOLN
INTRAMUSCULAR | Status: AC
  Administered 2017-06-01: 25 ug via INTRAVENOUS
  Filled 2017-06-01: qty 2

## 2017-06-01 MED ORDER — FAMOTIDINE 20 MG PO TABS
20.0000 mg | ORAL_TABLET | Freq: Once | ORAL | Status: AC
  Administered 2017-06-01: 20 mg via ORAL

## 2017-06-01 MED ORDER — LIDOCAINE HCL (CARDIAC) 20 MG/ML IV SOLN
INTRAVENOUS | Status: DC | PRN
  Administered 2017-06-01: 60 mg via INTRAVENOUS

## 2017-06-01 MED ORDER — PROPOFOL 10 MG/ML IV BOLUS
INTRAVENOUS | Status: AC
  Filled 2017-06-01: qty 20

## 2017-06-01 MED ORDER — DIPHENHYDRAMINE HCL 12.5 MG/5ML PO ELIX
12.5000 mg | ORAL_SOLUTION | Freq: Four times a day (QID) | ORAL | Status: DC | PRN
  Filled 2017-06-01: qty 5

## 2017-06-01 MED ORDER — MUPIROCIN 2 % EX OINT
1.0000 "application " | TOPICAL_OINTMENT | Freq: Two times a day (BID) | CUTANEOUS | Status: DC
  Administered 2017-06-01 – 2017-06-03 (×5): 1 via NASAL
  Filled 2017-06-01: qty 22

## 2017-06-01 MED ORDER — PANTOPRAZOLE SODIUM 40 MG IV SOLR
40.0000 mg | Freq: Every day | INTRAVENOUS | Status: DC
  Administered 2017-06-01 – 2017-06-02 (×2): 40 mg via INTRAVENOUS
  Filled 2017-06-01 (×2): qty 40

## 2017-06-01 MED ORDER — CYCLOBENZAPRINE HCL 10 MG PO TABS: 10.0000 mg | ORAL_TABLET | Freq: Every evening | ORAL | Status: DC | PRN

## 2017-06-01 MED ORDER — MIDAZOLAM HCL 2 MG/2ML IJ SOLN
INTRAMUSCULAR | Status: DC | PRN
  Administered 2017-06-01: 2 mg via INTRAVENOUS

## 2017-06-01 MED ORDER — PROPOFOL 10 MG/ML IV BOLUS
INTRAVENOUS | Status: DC | PRN
  Administered 2017-06-01: 150 mg via INTRAVENOUS

## 2017-06-01 MED ORDER — SUGAMMADEX SODIUM 200 MG/2ML IV SOLN
INTRAVENOUS | Status: DC | PRN
  Administered 2017-06-01: 200 mg via INTRAVENOUS

## 2017-06-01 MED ORDER — GLIPIZIDE ER 5 MG PO TB24
5.0000 mg | ORAL_TABLET | Freq: Every day | ORAL | Status: DC
  Administered 2017-06-02 – 2017-06-03 (×2): 5 mg via ORAL
  Filled 2017-06-01 (×2): qty 1

## 2017-06-01 MED ORDER — ONDANSETRON 4 MG PO TBDP: 4.0000 mg | ORAL_TABLET | Freq: Four times a day (QID) | ORAL | Status: DC | PRN

## 2017-06-01 MED ORDER — DIPHENHYDRAMINE HCL 50 MG/ML IJ SOLN: 12.5000 mg | Freq: Four times a day (QID) | INTRAMUSCULAR | Status: DC | PRN

## 2017-06-01 MED ORDER — FENTANYL CITRATE (PF) 250 MCG/5ML IJ SOLN
INTRAMUSCULAR | Status: AC
  Filled 2017-06-01: qty 5

## 2017-06-01 MED ORDER — KETOROLAC TROMETHAMINE 30 MG/ML IJ SOLN
INTRAMUSCULAR | Status: AC
  Filled 2017-06-01: qty 1

## 2017-06-01 MED ORDER — OXYCODONE-ACETAMINOPHEN 7.5-325 MG PO TABS
1.0000 | ORAL_TABLET | Freq: Four times a day (QID) | ORAL | Status: DC | PRN
  Administered 2017-06-01 – 2017-06-03 (×6): 1 via ORAL
  Filled 2017-06-01 (×7): qty 1

## 2017-06-01 MED ORDER — DEXAMETHASONE SODIUM PHOSPHATE 10 MG/ML IJ SOLN
INTRAMUSCULAR | Status: DC | PRN
  Administered 2017-06-01: 8 mg via INTRAVENOUS

## 2017-06-01 SURGICAL SUPPLY — 28 items
CANISTER SUCT 1200ML W/VALVE (MISCELLANEOUS) ×3 IMPLANT
CHLORAPREP W/TINT 26ML (MISCELLANEOUS) ×3 IMPLANT
DRAIN PENROSE 1/4X12 LTX (DRAIN) IMPLANT
DRAPE LAPAROTOMY 100X77 ABD (DRAPES) ×3 IMPLANT
ELECT CAUTERY BLADE 6.4 (BLADE) ×3 IMPLANT
ELECT REM PT RETURN 9FT ADLT (ELECTROSURGICAL) ×3
ELECTRODE REM PT RTRN 9FT ADLT (ELECTROSURGICAL) ×1 IMPLANT
GAUZE SPONGE 4X4 12PLY STRL (GAUZE/BANDAGES/DRESSINGS) ×3 IMPLANT
GLOVE BIO SURGEON STRL SZ7.5 (GLOVE) ×6 IMPLANT
GLOVE INDICATOR 8.0 STRL GRN (GLOVE) ×3 IMPLANT
GOWN STRL REUS W/ TWL LRG LVL3 (GOWN DISPOSABLE) ×1 IMPLANT
GOWN STRL REUS W/ TWL XL LVL3 (GOWN DISPOSABLE) ×1 IMPLANT
GOWN STRL REUS W/TWL LRG LVL3 (GOWN DISPOSABLE) ×2
GOWN STRL REUS W/TWL XL LVL3 (GOWN DISPOSABLE) ×2
HOLDER FOLEY CATH W/STRAP (MISCELLANEOUS) IMPLANT
LABEL OR SOLS (LABEL) ×3 IMPLANT
NEEDLE HYPO 22GX1.5 SAFETY (NEEDLE) ×3 IMPLANT
NS IRRIG 1000ML POUR BTL (IV SOLUTION) ×3 IMPLANT
PACK BASIN MAJOR ARMC (MISCELLANEOUS) ×3 IMPLANT
PAD ABD DERMACEA PRESS 5X9 (GAUZE/BANDAGES/DRESSINGS) ×6 IMPLANT
RELOAD PROXIMATE 75MM BLUE (ENDOMECHANICALS) ×3 IMPLANT
STAPLER PROXIMATE 75MM BLUE (STAPLE) ×3 IMPLANT
STAPLER SKIN PROX 35W (STAPLE) ×3 IMPLANT
SUT PDS #1 CTX NDL (SUTURE) ×6 IMPLANT
SUT SILK 2 0 SH (SUTURE) ×3 IMPLANT
SUT SILK 3-0 (SUTURE) ×3 IMPLANT
SYR 10ML LL (SYRINGE) ×3 IMPLANT
TRAY FOLEY W/METER SILVER 16FR (SET/KITS/TRAYS/PACK) IMPLANT

## 2017-06-01 NOTE — Anesthesia Post-op Follow-up Note (Signed)
Anesthesia QCDR form completed.        

## 2017-06-01 NOTE — Anesthesia Procedure Notes (Signed)
Procedure Name: Intubation Date/Time: 06/01/2017 9:15 AM Performed by: Hedda Slade, CRNA Pre-anesthesia Checklist: Patient identified, Patient being monitored, Timeout performed, Emergency Drugs available and Suction available Patient Re-evaluated:Patient Re-evaluated prior to induction Oxygen Delivery Method: Circle system utilized Preoxygenation: Pre-oxygenation with 100% oxygen Induction Type: IV induction Ventilation: Mask ventilation without difficulty Laryngoscope Size: Mac and 3 Grade View: Grade I Tube type: Oral Tube size: 7.0 mm Number of attempts: 1 Airway Equipment and Method: Stylet Placement Confirmation: ETT inserted through vocal cords under direct vision,  positive ETCO2 and breath sounds checked- equal and bilateral Secured at: 21 cm Tube secured with: Tape Dental Injury: Teeth and Oropharynx as per pre-operative assessment

## 2017-06-01 NOTE — Anesthesia Postprocedure Evaluation (Signed)
Anesthesia Post Note  Patient: Kara [name]  Procedure(s) Performed: LOOP ILEOSTOMY TAKEDOWN (N/A )  Patient location during evaluation: PACU Anesthesia Type: General Level of consciousness: awake and alert Pain management: pain level controlled Vital Signs Assessment: post-procedure vital signs reviewed and stable Respiratory status: spontaneous breathing, nonlabored ventilation, respiratory function stable and patient connected to nasal cannula oxygen Cardiovascular status: blood pressure returned to baseline and stable Postop Assessment: no apparent nausea or vomiting Anesthetic complications: no     Last Vitals:  Vitals:   06/01/17 1210 06/01/17 1233  BP: 125/64 (!) 126/52  Pulse: 86 83  Resp: 19 16  Temp:  36.4 C  SpO2: 97% 96%    Last Pain:  Vitals:   06/01/17 1208  TempSrc:   PainSc: 3                  Molli Barrows

## 2017-06-01 NOTE — Transfer of Care (Signed)
Immediate Anesthesia Transfer of Care Note  Patient: Kara [name]  Procedure(s) Performed: LOOP ILEOSTOMY TAKEDOWN (N/A )  Patient Location: PACU  Anesthesia Type:General  Level of Consciousness: awake, alert  and oriented  Airway & Oxygen Therapy: Patient Spontanous Breathing and Patient connected to face mask oxygen  Post-op Assessment: Report given to RN and Post -op Vital signs reviewed and stable  Post vital signs: Reviewed and stable  Last Vitals:  Vitals:   06/01/17 0811 06/01/17 1125  BP: 116/63 134/68  Pulse: 82 96  Resp: 17 (!) 41  Temp: (!) 36 C 37.7 C  SpO2: 96% 95%    Last Pain:  Vitals:   06/01/17 1125  TempSrc:   PainSc: Asleep         Complications: No apparent anesthesia complications

## 2017-06-01 NOTE — Interval H&P Note (Signed)
History and Physical Interval Note:  06/01/2017 8:39 AM  Kara [name]  has presented today for surgery, with the diagnosis of ILEOSTOMY IN PLACE  The various methods of treatment have been discussed with the patient and family. After consideration of risks, benefits and other options for treatment, the patient has consented to  Procedure(s): LOOP ILEOSTOMY TAKEDOWN (N/A) as a surgical intervention .  The patient's history has been reviewed, patient examined, no change in status, stable for surgery.  I have reviewed the patient's chart and labs.  Questions were answered to the patient's satisfaction.     [name]

## 2017-06-01 NOTE — Brief Op Note (Signed)
06/01/2017  11:14 AM  PATIENT:  Kara [name]  67 y.o. female  PRE-OPERATIVE DIAGNOSIS:  ILEOSTOMY IN PLACE  POST-OPERATIVE DIAGNOSIS:  ILEOSTOMY IN PLACE  PROCEDURE:  Procedure(s): LOOP ILEOSTOMY TAKEDOWN (N/A)  SURGEON:  Surgeon(s) and Role:    * [name], MD - Primary  PHYSICIAN ASSISTANT:   ASSISTANTS: PA student   ANESTHESIA:   general  EBL:  40 mL   BLOOD ADMINISTERED:none  DRAINS: Penrose drain in the prior ostomy site   LOCAL MEDICATIONS USED:  NONE  SPECIMEN:  No Specimen  DISPOSITION OF SPECIMEN:  N/A  COUNTS:  YES  TOURNIQUET:  * No tourniquets in log *  DICTATION: .Dragon Dictation  PLAN OF CARE: Admit to inpatient   PATIENT DISPOSITION:  PACU - hemodynamically stable.   Delay start of Pharmacological VTE agent (>24hrs) due to surgical blood loss or risk of bleeding: no

## 2017-06-01 NOTE — Anesthesia Preprocedure Evaluation (Signed)
Anesthesia Evaluation  Patient identified by MRN, date of birth, ID band Patient awake    Reviewed: Allergy & Precautions, H&P , NPO status , Patient's Chart, lab work & pertinent test results, reviewed documented beta blocker date and time   Airway Mallampati: II  TM Distance: >3 FB Neck ROM: full    Dental  (+) Poor Dentition, Missing   Pulmonary neg pulmonary ROS, pneumonia, resolved,    Pulmonary exam normal        Cardiovascular hypertension, + angina with exertion +CHF  (-) Orthopnea and (-) PND negative cardio ROS Normal cardiovascular exam Rhythm:regular Rate:Normal     Neuro/Psych negative neurological ROS  negative psych ROS   GI/Hepatic negative GI ROS, Neg liver ROS,   Endo/Other  negative endocrine ROSdiabetes, Poorly Controlled, Type 2  Renal/GU negative Renal ROS  negative genitourinary   Musculoskeletal   Abdominal   Peds  Hematology negative hematology ROS (+)   Anesthesia Other Findings Past Medical History: No date: Anginal pain (HCC) No date: Arthritis     Comment:  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, 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 No date: CHF (congestive heart failure) (HCC) No date: Diabetes (HCC) No date: Diverticulitis No date: Hypertension No date: PE (pulmonary thromboembolism) ([place]) No date: 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[facility]) Past Surgical History: 02/24/2017: APPENDECTOMY; N/A     Comment:  Procedure: Incidental  APPENDECTOMY;  Surgeon: [name], MD;  Location: [name]S;  Service: General;                Laterality: N/A; No date: CARPAL TUNNEL RELEASE; Bilateral 11/02/2016: COLON RESECTION SIGMOID; N/A     Comment:  Procedure: COLON RESECTION SIGMOID;  Surgeon: [name], MD;  Location: [name];  Service: General;                Laterality: N/A; 11/02/2016: COLON SURGERY 02/03/2017: COLONOSCOPY WITH PROPOFOL; N/A     Comment:  Procedure:  COLONOSCOPY WITH PROPOFOL;  Surgeon: Lin Landsman, MD;  Location: [name];  Service:               Gastroenterology;  Laterality: N/A; 11/02/2016: COLOSTOMY; N/A     Comment:  Procedure: COLOSTOMY;  Surgeon: [name], MD;                Location: [name];  Service: General;  Laterality: N/A; 02/24/2017: COLOSTOMY REVERSAL; N/A     Comment:  Procedure: COLOSTOMY REVERSAL, ostomy takedown, spleenic              flexure mobilization, excision rectal stump/distal               sigmoid, anastomosis with suture reinforcement;  Surgeon:              [name], MD;  Location: [name];  Service:               General;  Laterality: N/A; 02/24/2017: ILEO LOOP DIVERSION; N/A     Comment:  Procedure: ILEO LOOP COLOSTOMY;  Surgeon: [name], MD;  Location: [name];  Service: General;                Laterality:  N/A; 10/2016: IVC FILTER INSERTION; Right No date: KNEE SURGERY     Comment:  torn menicus 02/24/2017: LAPAROSCOPY; N/A     Comment:  Procedure: LAPAROSCOPY DIAGNOSTIC;  Surgeon: [name], MD;  Location: [name];  Service: General;                Laterality: N/A; 02/24/2017: LYSIS OF ADHESION; N/A     Comment:  Procedure: LYSIS OF ADHESION;  Surgeon: [name], MD;  Location: [name];  Service: General;                Laterality: N/A; 11/19/2016: PULMONARY VENOGRAPHY; N/A     Comment:  Procedure: Pulmonary Venography; IVC filter placement;               possible pulmonary thrombectomy;  Surgeon: Katha Cabal, MD;  Location: [facility];  Service:              Cardiovascular;  Laterality: N/A; 11/13/2016: SIGMOIDOSCOPY; N/A     Comment:  Procedure: endoscopic  flexible SIGMOIDOSCOPY;  Surgeon:              Florene Glen, MD;  Location: [name];  Service:               General;  Laterality: N/A; 05/19/2017: Lonell Face; N/A     Comment:  Procedure:  FLEXIBLE SIGMOIDOSCOPY;  Surgeon: [name], MD;  Location: [name];  Service: General;                Laterality: N/A; BMI    Body Mass Index:  36.67 kg/m     Reproductive/Obstetrics negative OB ROS                             Anesthesia Physical Anesthesia Plan  ASA: III  Anesthesia Plan: General ETT   Post-op Pain Management:    Induction:   PONV Risk Score and Plan:   Airway Management Planned:   Additional Equipment:   Intra-op Plan:   Post-operative Plan:   Informed Consent: I have reviewed the patients History and Physical, chart, labs and discussed the procedure including the risks, benefits and alternatives for the proposed anesthesia with the patient or authorized representative who has indicated his/her understanding and acceptance.   Dental Advisory Given  Plan Discussed with: CRNA  Anesthesia Plan Comments:         Anesthesia Quick Evaluation

## 2017-06-01 NOTE — Telephone Encounter (Signed)
Cardiac Clearance obtained and approved at this time. Cardiac clearance can be found under Media tab in patients chart.

## 2017-06-01 NOTE — Op Note (Signed)
   Pre-operative Diagnosis: Loop ileostomy in place  Post-operative Diagnosis: Same  Procedure performed: Loop ileostomy takedown and closure  Surgeon: [name]   Assistants: PA student  Anesthesia: General endotracheal anesthesia  ASA Class: 2  Surgeon: [name], MD FACS  Anesthesia: Gen. with endotracheal tube  Assistant: As above  Procedure Details  The patient was seen again in the Holding Room. The benefits, complications, treatment options, and expected outcomes were discussed with the patient. The risks of bleeding, infection, recurrence of symptoms, failure to resolve symptoms,  bowel injury, any of which could require further surgery were reviewed with the patient.   The patient was taken to Operating Room, identified as Kara [name] and the procedure verified.  A Time Out was held and the above information confirmed.  Prior to the induction of general anesthesia, antibiotic prophylaxis was administered. VTE prophylaxis was in place. General endotracheal anesthesia was then administered and tolerated well. After the induction, the ileostomy was sewn shut with a running 2-0 silk suture, the abdomen was prepped with Chloraprep and ileostomy site was prepped with Betadine and draped in the sterile fashion. The patient was positioned in the supine position.  The ileostomy site was excised with an elliptical incision with a 10 blade scalpel.  Using Bovie electrocautery and blunt dissection was taken down to the level of the fascia.  The fascia was circumferentially freed up with electrocautery allowing the ileostomy to be freely pulled out of the abdomen.  The bowel that was closest to the skin was sharply excised.  There was brisk bleeding from the mesentery that was sharply cut that was controlled with a suture ligature.  Using a 75 mm blue load stapler the 2 ends were able to be stapled back together.  The staple line was visualized after it was placed and  noted to be hemostatic.  An additional fire the staple line closed the enterotomy.  The staple line was then reinforced with a section of mesenteric fat that was sutured across the staple line.  The loop of ileum was then able to be returned into the abdomen with ease.  The fascia was closed with an interrupted figure-of-eight #1 PDS.  The entire area was then copiously irrigated.  A Penrose drain was placed into the wound and secured with surgical staples.  The abdomen was cleaned with wet and dry lap sponge and a dressing of a honeycomb dressing was placed over the ileostomy site.  The patient tolerated the procedure well.  She was awoken from general endotracheal anesthesia and transferred to the PACU in good condition.  All counts were correct at the end of the procedure and there were no immediate complications.  Findings: Loop ileostomy   Estimated Blood Loss: 40 mL         Drains: Penrose drain in the prior ostomy site         Specimens: None          Complications: None                  Condition: Good   [name], MD, FACS

## 2017-06-02 ENCOUNTER — Encounter: Payer: Self-pay | Admitting: General Surgery

## 2017-06-02 ENCOUNTER — Other Ambulatory Visit: Payer: Self-pay

## 2017-06-02 LAB — BASIC METABOLIC PANEL
Anion gap: 8 (ref 5–15)
BUN: 11 mg/dL (ref 6–20)
CALCIUM: 8.5 mg/dL — AB (ref 8.9–10.3)
CO2: 22 mmol/L (ref 22–32)
Chloride: 109 mmol/L (ref 101–111)
Creatinine, Ser: 0.61 mg/dL (ref 0.44–1.00)
GFR calc Af Amer: >60 mL/min (ref >60)
GLUCOSE: 133 mg/dL — AB (ref 65–99)
Potassium: 3.8 mmol/L (ref 3.5–5.1)
SODIUM: 139 mmol/L (ref 135–145)

## 2017-06-02 LAB — CBC
HCT: 38.2 % (ref 35.0–47.0)
Hemoglobin: 12.9 g/dL (ref 12.0–16.0)
MCH: 30 pg (ref 26.0–34.0)
MCHC: 33.8 g/dL (ref 32.0–36.0)
MCV: 88.9 fL (ref 80.0–100.0)
PLATELETS: 199 10*3/uL (ref 150–440)
RBC: 4.3 MIL/uL (ref 3.80–5.20)
RDW: 13.8 % (ref 11.5–14.5)
WBC: 11.9 10*3/uL — ABNORMAL HIGH (ref 3.6–11.0)

## 2017-06-02 MED ORDER — ACETAMINOPHEN 325 MG PO TABS
650.0000 mg | ORAL_TABLET | Freq: Four times a day (QID) | ORAL | Status: DC | PRN
  Administered 2017-06-02: 650 mg via ORAL
  Filled 2017-06-02: qty 2

## 2017-06-02 NOTE — Plan of Care (Signed)
Pt is progressing. Pain has been manageable. Pt ambulating to restroom. Pt had BM today. Diet progressed to regular for dinner

## 2017-06-02 NOTE — Progress Notes (Signed)
1 Day Post-Op   Subjective: Patient reports that she had a good night.  She continues to have discomfort at her surgical site only.  Tolerating a clear liquid diet without nausea or vomiting.  She has been passing flatus.  Vital signs in last 24 hours: Temp:  [97.6 F (36.4 C)-99.8 F (37.7 C)] 98 F (36.7 C) (01/10 0509) Pulse Rate:  [83-96] 93 (01/10 0509) Resp:  [16-41] 20 (01/10 0509) BP: (104-134)/(52-68) 104/56 (01/10 0509) SpO2:  [95 %-98 %] 95 % (01/10 0509)    Intake/Output from previous day: 01/09 0701 - 01/10 0700 In: 1648 [I.V.:1648] Out: 40 [Blood:40]  GI: Abdomen is soft, appropriately tender to palpation at the incision sites, nondistended.  Staple line is in place with Penrose drain.  No evidence of erythema or purulent drainage currently.  Lab Results:  CBC Recent Labs    06/01/17 1525 06/02/17 0439  WBC 12.6* 11.9*  HGB 13.6 12.9  HCT 41.0 38.2  PLT 191 199   CMP     Component Value Date/Time   NA 139 06/02/2017 0439   NA 139 05/30/2013 0520   K 3.8 06/02/2017 0439   K 3.6 05/30/2013 0520   CL 109 06/02/2017 0439   CL 108 (H) 05/30/2013 0520   CO2 22 06/02/2017 0439   CO2 27 05/30/2013 0520   GLUCOSE 133 (H) 06/02/2017 0439   GLUCOSE 98 05/30/2013 0520   BUN 11 06/02/2017 0439   BUN 11 05/30/2013 0520   CREATININE 0.61 06/02/2017 0439   CREATININE 0.90 05/30/2013 0520   CALCIUM 8.5 (L) 06/02/2017 0439   CALCIUM 7.9 (L) 05/30/2013 0520   PROT 7.4 04/21/2017 0908   PROT 8.2 05/29/2013 0631   ALBUMIN 3.8 04/21/2017 0908   ALBUMIN 3.8 05/29/2013 0631   AST 40 04/21/2017 0908   AST 33 05/29/2013 0631   ALT 53 04/21/2017 0908   ALT 54 05/29/2013 0631   ALKPHOS 88 04/21/2017 0908   ALKPHOS 64 05/29/2013 0631   BILITOT 0.8 04/21/2017 0908   BILITOT 0.5 05/29/2013 0631   GFRNONAA >60 06/02/2017 0439   GFRNONAA >60 05/30/2013 0520   GFRAA >60 06/02/2017 0439   GFRAA >60 05/30/2013 0520   PT/INR No results for input(s): LABPROT, INR in the  last 72 hours.  Studies/Results: No results found.  Assessment/Plan: 67 year old female status post loop ileostomy takedown.  Doing very well.  Plan to advance diet as morning.  Encourage ambulation and obtain physical therapy consult to assist with her mobility.  Encourage incentive spirometer usage.  Awaiting bowel movement prior to being ready to discharge.   [name], MD [facility]  Day [facility] [phone] Night [facility] [phone]  06/02/2017

## 2017-06-02 NOTE — Evaluation (Signed)
Physical Therapy Evaluation Patient Details Name: Kara [name] MRN: [id number] DOB: 1951-05-15 Today's Date: 06/02/2017   History of Present Illness  Pt is a 67 year old female status post loop ileostomy takedown.  PMH includes: HTN, angina with exertion, CHF, DM II, PE, and Hartmann's procedure.    Clinical Impression  Pt performed very well during PT evaluation including Ind with bed mobility and transfers.  Pt able to amb 200' with Mod Ind with use of RW with very good stability and no adverse symptoms.  Pt steady up and down 6 steps with B rails.  Pt education provided for log roll technique during sup to/from sit for decreased abdominal strain with pt able to perform with only minimally increased time and effort and without c/o pain.  Will complete PT orders at this time but will reassess pt pending a decline in status upon receipt of new PT orders.     Follow Up Recommendations No PT follow up    Equipment Recommendations  None recommended by PT    Recommendations for Other Services       Precautions / Restrictions Precautions Precautions: Other (comment) Precaution Comments: abdominal incision and penrose drain to lower abdominal wall Restrictions Weight Bearing Restrictions: No      Mobility  Bed Mobility Overal bed mobility: Modified Independent             General bed mobility comments: Pt education and practice provided for log roll technique during sup to/from sit for decreased abdominal strain  Transfers Overall transfer level: Independent Equipment used: Rolling walker (2 wheeled)                Ambulation/Gait Ambulation/Gait assistance: Independent Ambulation Distance (Feet): 200 Feet Assistive device: Rolling walker (2 wheeled) Gait Pattern/deviations: WFL(Within Functional Limits)   Gait velocity interpretation: Below normal speed for age/gender General Gait Details: Pt steady during gait including sharp turns in tight  spaces  Stairs Stairs: Yes Stairs assistance: Independent Stair Management: Two rails Number of Stairs: 6 General stair comments: Good eccentric and concentric control during stair training  Wheelchair Mobility    Modified Rankin (Stroke Patients Only)       Balance Overall balance assessment: Independent                                           Pertinent Vitals/Pain Pain Assessment: No/denies pain    Home Living Family/patient expects to be discharged to:: Private residence Living Arrangements: Spouse/significant other Available Help at Discharge: Family;Friend(s);Available 24 hours/day Type of Home: House Home Access: Stairs to enter Entrance Stairs-Rails: Right;Left;Can reach both Entrance Stairs-Number of Steps: 4 Home Layout: One level Home Equipment: Walker - 2 wheels;Walker - 4 wheels;Cane - single point;Shower seat;Toilet riser;Grab bars - toilet      Prior Function Level of Independence: Independent;Independent with assistive device(s)         Comments: Pt Ind with Amb without AD with occasional use of SPC, Ind with ADLs, no fall history, 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     Journalist, newspaper        Extremity/Trunk Assessment   Upper Extremity Assessment Upper Extremity Assessment: Overall WFL for tasks assessed    Lower Extremity Assessment Lower Extremity Assessment: Overall WFL for tasks assessed       Communication   Communication: No difficulties  Cognition Arousal/Alertness: Awake/alert Behavior During Therapy: WFL for  tasks assessed/performed Overall Cognitive Status: Within Functional Limits for tasks assessed                                        General Comments      Exercises Other Exercises Other Exercises: Pt education provided for log roll technique during sup to/from sit for decreased abdominal strain Other Exercises: Pt education provided regarding principles of activity progression  and physiological benefits of activity    Assessment/Plan    PT Assessment Patent does not need any further PT services  PT Problem List         PT Treatment Interventions      PT Goals (Current goals can be found in the Care Plan section)  Acute Rehab PT Goals PT Goal Formulation: All assessment and education complete, DC therapy    Frequency     Barriers to discharge        Co-evaluation               AM-PAC PT "6 Clicks" Daily Activity  Outcome Measure Difficulty turning over in bed (including adjusting bedclothes, sheets and blankets)?: None Difficulty moving from lying on back to sitting on the side of the bed? : None Difficulty sitting down on and standing up from a chair with arms (e.g., wheelchair, bedside commode, etc,.)?: None Help needed moving to and from a bed to chair (including a wheelchair)?: None Help needed walking in hospital room?: None Help needed climbing 3-5 steps with a railing? : None 6 Click Score: 24    End of Session Equipment Utilized During Treatment: Gait belt;Other (comment)(Gait belt under arms to avoid surgical area) Activity Tolerance: Patient tolerated treatment well Patient left: in bed;with family/visitor present;with call bell/phone within reach Nurse Communication: Mobility status PT Visit Diagnosis: Difficulty in walking, not elsewhere classified (R26.2);Muscle weakness (generalized) (M62.81)    Time: 8887-5797 PT Time Calculation (min) (ACUTE ONLY): 30 min   Charges:   PT Evaluation $PT Eval Low Complexity: 1 Low PT Treatments $Therapeutic Activity: 8-22 mins   PT G Codes:        DRoyetta Asal PT, DPT 06/02/17, 6:03 PM

## 2017-06-03 LAB — BASIC METABOLIC PANEL
ANION GAP: 10 (ref 5–15)
BUN: 11 mg/dL (ref 6–20)
CHLORIDE: 108 mmol/L (ref 101–111)
CO2: 22 mmol/L (ref 22–32)
Calcium: 8.1 mg/dL — ABNORMAL LOW (ref 8.9–10.3)
Creatinine, Ser: 0.62 mg/dL (ref 0.44–1.00)
Glucose, Bld: 141 mg/dL — ABNORMAL HIGH (ref 65–99)
POTASSIUM: 3.7 mmol/L (ref 3.5–5.1)
SODIUM: 140 mmol/L (ref 135–145)

## 2017-06-03 LAB — CBC
HCT: 37.8 % (ref 35.0–47.0)
HEMOGLOBIN: 12.7 g/dL (ref 12.0–16.0)
MCH: 30.5 pg (ref 26.0–34.0)
MCHC: 33.7 g/dL (ref 32.0–36.0)
MCV: 90.4 fL (ref 80.0–100.0)
PLATELETS: 186 10*3/uL (ref 150–440)
RBC: 4.18 MIL/uL (ref 3.80–5.20)
RDW: 14 % (ref 11.5–14.5)
WBC: 9.3 10*3/uL (ref 3.6–11.0)

## 2017-06-03 MED ORDER — OXYCODONE-ACETAMINOPHEN 7.5-325 MG PO TABS: 1.0000 | ORAL_TABLET | Freq: Four times a day (QID) | ORAL | 0 refills | Status: DC | PRN

## 2017-06-03 NOTE — Discharge Summary (Signed)
Patient ID: Kara [name] MRN: [id number] DOB/AGE: 66/12/1950 66 y.o.  Admit date: 06/01/2017 Discharge date: 06/03/2017  Discharge Diagnoses:  Ileostomy takedown   Procedures Performed: Loop ileostomy takedown  Discharged Condition: good  Hospital Course: Patient taken to operating room for a scheduled loop ileostomy takedown. She tolerated the procedure well. On the day of discharge she was tolerating a regular diet, her pain was controlled with oral medications and her abdomen was benign.  Discharge Orders: Discharge Instructions    Call MD for:  difficulty breathing, headache or visual disturbances   Complete by:  As directed    Call MD for:  persistant nausea and vomiting   Complete by:  As directed    Call MD for:  redness, tenderness, or signs of infection (pain, swelling, redness, odor or green/yellow discharge around incision site)   Complete by:  As directed    Call MD for:  severe uncontrolled pain   Complete by:  As directed    Call MD for:  temperature >100.4   Complete by:  As directed    Diet - low sodium heart healthy   Complete by:  As directed    Increase activity slowly   Complete by:  As directed       Disposition: 01-Home or Self Care  Discharge Medications: Allergies as of 06/03/2017      Reactions   Adhesive [tape] Other (See Comments)   Pt reports, when removing tape from skin most tapes pull her skin off. Ok to use paper tape   Morphine Nausea Only   Other Hives, Other (See Comments)   Chlorhexadine wipes/CHG wipes Pt reports, when removing tape from skin most tapes pull her skin off. Ok to use paper tape   Latex Rash   Morphine And Related Nausea And Vomiting      Medication List    TAKE these medications   aspirin EC 81 MG tablet Take 81 mg by mouth daily.   cyclobenzaprine 10 MG tablet Commonly known as:  FLEXERIL Take 10 mg by mouth at bedtime as needed for muscle spasms.   erythromycin base 500 MG tablet Commonly  known as:  E-MYCIN Take 2 tablets (1,000 mg total) by mouth 3 (three) times daily.   glipiZIDE 5 MG 24 hr tablet Commonly known as:  GLUCOTROL XL Take 5 mg by mouth daily with breakfast.   losartan 50 MG tablet Commonly known as:  COZAAR Take 1 tablet by mouth daily.   MULTI-VITAMINS Tabs Take 1 tablet by mouth daily.   mupirocin ointment 2 % Commonly known as:  BACTROBAN Place 1 application into the nose 2 (two) times daily.   oxyCODONE-acetaminophen 7.5-325 MG tablet Commonly known as:  PERCOCET Take 1 tablet by mouth every 6 (six) hours as needed for severe pain. What changed:  Another medication with the same name was added. Make sure you understand how and when to take each.   oxyCODONE-acetaminophen 7.5-325 MG tablet Commonly known as:  PERCOCET Take 1 tablet by mouth every 6 (six) hours as needed for severe pain. What changed:  You were already taking a medication with the same name, and this prescription was added. Make sure you understand how and when to take each.        Follwup: Follow-up Information    [name], MD. Go in 1 week(s).   Specialty:  General Surgery Why:  postop Contact information: [address] [place] [address] [phone]  SignedClayburn Pert 06/03/2017, 12:01 PM

## 2017-06-03 NOTE — Progress Notes (Signed)
Pt discharged per MD order. IVs removed. Dressing change instructions reviewed with pt. Pt given supplies for dressing change. Prescription given to pt. Discharge instructions reviewed with pt. Pt verbalized understanding and all questions answered to pt satisfaction. Pt taken to car in wheelchair by volunteer.

## 2017-06-03 NOTE — Care Management Important Message (Signed)
Important Message  Patient Details  Name: Kara [name] MRN: [id number] Date of Birth: 02-25-1951   Medicare Important Message Given:  N/A - LOS <3 / Initial given by admissions    Beverly Sessions, RN 06/03/2017, 1:53 PM

## 2017-06-09 ENCOUNTER — Telehealth: Payer: Self-pay

## 2017-06-09 NOTE — Telephone Encounter (Addendum)
Patient is calling stating that when she took a shower today, her aid removed her bandaging and when she pulled it out, she also pulled most of the penrose drain. Patient wanted to know what she should do. I told her that I would speak with the physician and then I would call her back.  I then spoke with Dr. Adonis Huguenin and told him what the patient had stated. He told me to tell the patient that she was able to pull the rest of the drain and to apply a dry gauze and to change it as often as needed to.   I called patient back and told her what to do and she stated that she would do so. I reminded the patient of her appointment with Dr. Adonis Huguenin tomorrow and she stated that she had remembered. Patient had no further questions.

## 2017-06-10 ENCOUNTER — Ambulatory Visit (INDEPENDENT_AMBULATORY_CARE_PROVIDER_SITE_OTHER): Payer: PPO | Admitting: General Surgery

## 2017-06-10 ENCOUNTER — Encounter: Payer: Self-pay | Admitting: General Surgery

## 2017-06-10 VITALS — BP 132/84 | HR 89 | Temp 97.8°F | Ht 63.0 in | Wt 215.6 lb

## 2017-06-10 DIAGNOSIS — Z4889 Encounter for other specified surgical aftercare: Secondary | ICD-10-CM

## 2017-06-10 NOTE — Progress Notes (Signed)
Outpatient Surgical Follow Up  06/10/2017  Kara [name] is an 67 y.o. female.   Chief Complaint  Patient presents with  . Routine Post Op    post op--loop ileostomy takedown Kara [name] [birth year]    HPI: 67 year old female returns to clinic now 9 days status post loop ileostomy takedown.  Doing very well.  States her pain is well controlled.  She is tolerating regular diet and having bowel function.  She states her Penrose drain inadvertently came out during a dressing change yesterday.  She denies any fevers, chills, nausea, vomiting, chest pain, shortness of breath, diarrhea, constipation.  Past Medical History:  Diagnosis Date  . Anginal pain ([place])   . 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 CHF (congestive heart failure) ([place])   . Diabetes ([place])   . Diverticulitis   . Hypertension   . PE (pulmonary thromboembolism) ([place])   . 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[facility])     Past Surgical History:  Procedure Laterality Date  . APPENDECTOMY N/A 02/24/2017   Procedure: Incidental  APPENDECTOMY;  Surgeon: [name], MD;  Location: [name] ORS;  Service: General;  Laterality: N/A;  . CARPAL TUNNEL RELEASE Bilateral   . COLON RESECTION SIGMOID N/A 11/02/2016   Procedure: COLON RESECTION SIGMOID;  Surgeon: [name], MD;  Location: [name];  Service: General;  Laterality: N/A;  . COLON SURGERY  11/02/2016  . COLONOSCOPY WITH PROPOFOL N/A 02/03/2017   Procedure: COLONOSCOPY WITH PROPOFOL;  Surgeon: Lin Landsman, MD;  [facility] ENDOSCOPY;  Service: Gastroenterology;  Laterality: N/A;  . COLOSTOMY N/A 11/02/2016   Procedure: COLOSTOMY;  Surgeon: [name], MD;  Location: [name] ORS;  Service: General;  Laterality: N/A;  . COLOSTOMY REVERSAL N/A 02/24/2017   Procedure: COLOSTOMY REVERSAL, ostomy takedown, spleenic flexure mobilization, excision rectal stump/distal sigmoid, anastomosis with suture reinforcement;  Surgeon: [name], MD;  Location: [name] ORS;  Service: General;  Laterality: N/A;  . ILEO LOOP DIVERSION N/A 02/24/2017   Procedure: ILEO LOOP COLOSTOMY;  Surgeon: [name], MD;  Location: [name];  Service: General;  Laterality: N/A;  . ILEOSTOMY CLOSURE N/A 06/01/2017   Procedure: LOOP ILEOSTOMY TAKEDOWN;  Surgeon: [name], MD;  Location: [name]S;  Service: General;  Laterality: N/A;  . IVC FILTER INSERTION Right 10/2016  . KNEE SURGERY     torn menicus  . LAPAROSCOPY N/A 02/24/2017   Procedure: LAPAROSCOPY DIAGNOSTIC;  Surgeon: [name], MD;  Location: [name]S;  Service: General;  Laterality: N/A;  . LYSIS OF ADHESION N/A 02/24/2017   Procedure: LYSIS OF ADHESION;  Surgeon: [name], MD;  Location: [name] ORS;  Service: General;  Laterality: N/A;  . PULMONARY VENOGRAPHY N/A 11/19/2016   Procedure: Pulmonary Venography; IVC filter placement; possible pulmonary thrombectomy;  Surgeon: Katha Cabal, MD;  Location: [place] [facility];  Service: Cardiovascular;  Laterality: N/A;  . SIGMOIDOSCOPY N/A 11/13/2016   Procedure: endoscopic  flexible SIGMOIDOSCOPY;  Surgeon: Florene Glen, MD;  Location: [name] ORS;  Service: General;  Laterality: N/A;  . SIGMOIDOSCOPY N/A 05/19/2017   Procedure: FLEXIBLE SIGMOIDOSCOPY;  Surgeon: [name], MD;  Location: [name] ORS;  Service: General;  Laterality: N/A;    Family History  Problem Relation Age of Onset  . Diabetes Mother   . Heart disease Mother   . Cancer Mother   . Heart disease Father   . Diabetes Sister   . Heart disease Sister     Social  History:  reports that  has never smoked. she has never used smokeless tobacco. She reports that she does not drink alcohol or use drugs.  Allergies:  Allergies  Allergen Reactions  . Adhesive [Tape] Other (See Comments)    Pt reports, when removing tape from skin most tapes pull her skin off. Ok to use paper tape  . Morphine Nausea Only  . Other Hives and Other (See  Comments)    Chlorhexadine wipes/CHG wipes Pt reports, when removing tape from skin most tapes pull her skin off. Ok to use paper tape  . Latex Rash  . Morphine And Related Nausea And Vomiting    Medications reviewed.    ROS A multipoint review of systems was completed, all pertinent positives and negatives are documented within the HPI and the remainder are negative   BP 132/84   Pulse 89   Temp 97.8 F (36.6 C) (Oral)   Ht 5\' 3"  (1.6 m)   Wt 97.8 kg (215 lb 9.6 oz)   BMI 38.19 kg/m   Physical Exam  General: No acute distress Chest: Clear to auscultation Heart: Regular rate and rhythm Abdomen: Soft, nondistended, appropriately tender to palpation at her incision site in the right lower quadrant.  Staples are in place with some hyperemia around it.  The skin easily separates with gentle palpation.   No results found for this or any previous visit (from the past 48 hour(s)). No results found.  Assessment/Plan:  1. Aftercare following surgery 67 year old female status post loop ileostomy takedown.  Doing well.  Staples are not quite ready to come out yet.  Discussed that we would see her back next week for wound check by my partner Dr. Burt Knack.  Instructed her to start using an antibiotic ointment due to the hyperemia around the staples.  I will see her back in clinic the following week for an additional wound check.     [name], MD FACS General Surgeon  06/10/2017,9:24 AM

## 2017-06-10 NOTE — Patient Instructions (Signed)
Please apply an antibiotic ointment lightly over the area and replace a new dressing over it.  If you have any questions or concern please give our office a call.  We will see you back as listed below:

## 2017-06-15 ENCOUNTER — Encounter: Payer: PPO | Admitting: Surgery

## 2017-06-15 DIAGNOSIS — E119 Type 2 diabetes mellitus without complications: Secondary | ICD-10-CM | POA: Diagnosis not present

## 2017-06-15 DIAGNOSIS — R8290 Unspecified abnormal findings in urine: Secondary | ICD-10-CM | POA: Diagnosis not present

## 2017-06-15 DIAGNOSIS — Z933 Colostomy status: Secondary | ICD-10-CM | POA: Diagnosis not present

## 2017-06-15 DIAGNOSIS — I10 Essential (primary) hypertension: Secondary | ICD-10-CM | POA: Diagnosis not present

## 2017-06-15 DIAGNOSIS — M5136 Other intervertebral disc degeneration, lumbar region: Secondary | ICD-10-CM | POA: Diagnosis not present

## 2017-06-15 DIAGNOSIS — Z86711 Personal history of pulmonary embolism: Secondary | ICD-10-CM | POA: Diagnosis not present

## 2017-06-16 ENCOUNTER — Encounter: Payer: Self-pay | Admitting: Surgery

## 2017-06-16 ENCOUNTER — Ambulatory Visit (INDEPENDENT_AMBULATORY_CARE_PROVIDER_SITE_OTHER): Payer: PPO | Admitting: Surgery

## 2017-06-16 VITALS — BP 136/83 | HR 83 | Temp 97.8°F | Ht 63.0 in | Wt 215.0 lb

## 2017-06-16 DIAGNOSIS — Z4889 Encounter for other specified surgical aftercare: Secondary | ICD-10-CM

## 2017-06-16 NOTE — Progress Notes (Signed)
Outpatient postop visit  06/16/2017  Kara [name] is an 67 y.o. female.    Procedure: Ileostomy closure  [id number] drainage  HPI: Per Dr. Reginal Lutes note, this patient is here for staple removal and wound check.  Patient has minimal drainage of a slightly seropurulent nature.  There is no erythema around the wound.  Staples are in place.  To the staples on the lateral side where the patient is concerned are disrupted.  Medications reviewed.    Physical Exam:  Ht 5\' 3"  (1.6 m)   BMI 38.19 kg/m     PE: No erythema minimal seropurulent drainage laterally.  All staples are removed and Steri-Strips are placed (when placing benzoin the patient became very agitated screaming that she is allergic to "glue".  She did not know what type of glue.  She showed me a picture on the phone of the ileostomy with what looks like a maculopapular rash or an irritation from succus that was taken when she came to the emergency room one time.  Reviewing her allergy notes there are no notations of being allergic to glue except adhesive tape.  She had paper tape in place without problems.  This benzoin was immediately within 15 seconds removed with alcohol swabs.  There was no residual findings of benzoin in place.)  The patient remained quite agitated while I removed for the remainder of the staples.  The wound was intact.  Steri-Strips were placed without any additional adhesive.  I question the patient and her significant other the type of "glue" that she was allergic to but she did not know.  I cannot find a note suggesting an allergy to any sort of "glue".  Patient is instructed to return next week to see Dr. Adonis Huguenin she has an appointment with him.  Also instructed her to return to the office or call should she have or experience any problems with the wound in the meantime.    Assessment/Plan:  As above follow-up next week  Florene Glen, MD, FACS

## 2017-06-17 ENCOUNTER — Telehealth: Payer: Self-pay | Admitting: General Surgery

## 2017-06-17 ENCOUNTER — Other Ambulatory Visit: Payer: Self-pay

## 2017-06-17 DIAGNOSIS — Z4889 Encounter for other specified surgical aftercare: Secondary | ICD-10-CM

## 2017-06-17 MED ORDER — AMOXICILLIN-POT CLAVULANATE 875-125 MG PO TABS: 1.0000 | ORAL_TABLET | Freq: Two times a day (BID) | ORAL | 0 refills | Status: DC

## 2017-06-17 NOTE — Telephone Encounter (Signed)
After speaking with Dr. Hampton Abbot he advised that the patient should start on Augmentin. I verbalized understanding and paced the order to be sent to [facility] per patient request.  Call made to patient at this time I verbalized to patient that I sent in Augmentin for her to start and that we will keep her scheduled appointment. However if her incision becomes worse with more drainage, warm to touch, becomes bright red to give our office ac all to be seen. She verbalized understanding.

## 2017-06-17 NOTE — Telephone Encounter (Signed)
Patients calling said her infection has a milky look to it, said it looks infected please call and advice.

## 2017-06-20 DIAGNOSIS — Z0000 Encounter for general adult medical examination without abnormal findings: Secondary | ICD-10-CM | POA: Diagnosis not present

## 2017-06-20 DIAGNOSIS — S31109A Unspecified open wound of abdominal wall, unspecified quadrant without penetration into peritoneal cavity, initial encounter: Secondary | ICD-10-CM | POA: Diagnosis not present

## 2017-06-20 DIAGNOSIS — Z86711 Personal history of pulmonary embolism: Secondary | ICD-10-CM | POA: Insufficient documentation

## 2017-06-20 DIAGNOSIS — E119 Type 2 diabetes mellitus without complications: Secondary | ICD-10-CM | POA: Diagnosis not present

## 2017-06-20 DIAGNOSIS — M5136 Other intervertebral disc degeneration, lumbar region: Secondary | ICD-10-CM | POA: Diagnosis not present

## 2017-06-20 DIAGNOSIS — Z9889 Other specified postprocedural states: Secondary | ICD-10-CM | POA: Diagnosis not present

## 2017-06-20 DIAGNOSIS — I10 Essential (primary) hypertension: Secondary | ICD-10-CM | POA: Diagnosis not present

## 2017-06-20 DIAGNOSIS — K5730 Diverticulosis of large intestine without perforation or abscess without bleeding: Secondary | ICD-10-CM | POA: Diagnosis not present

## 2017-06-20 DIAGNOSIS — Z1231 Encounter for screening mammogram for malignant neoplasm of breast: Secondary | ICD-10-CM | POA: Diagnosis not present

## 2017-06-20 DIAGNOSIS — Z95828 Presence of other vascular implants and grafts: Secondary | ICD-10-CM | POA: Diagnosis not present

## 2017-06-23 ENCOUNTER — Ambulatory Visit (INDEPENDENT_AMBULATORY_CARE_PROVIDER_SITE_OTHER): Payer: PPO | Admitting: General Surgery

## 2017-06-23 ENCOUNTER — Encounter: Payer: Self-pay | Admitting: General Surgery

## 2017-06-23 VITALS — BP 159/90 | HR 84 | Temp 94.6°F | Ht 63.0 in | Wt 217.0 lb

## 2017-06-23 DIAGNOSIS — Z4889 Encounter for other specified surgical aftercare: Secondary | ICD-10-CM

## 2017-06-23 NOTE — Progress Notes (Signed)
Outpatient Surgical Follow Up  06/23/2017  NIVEA WOJDYLA is an 67 y.o. female.   Chief Complaint  Patient presents with  . Routine Post Op    Loop Ileostomy Takedown Dr.Granvil Djordjevic 06/01/17    HPI: 67 year old female returns to clinic now 3 weeks status post loop ileostomy takedown.  Patient reports doing very well.  She had some purulent appearing drainage from her wound after her last visit was started on oral antibiotics.  It has completely resolved.  She was also then diagnosed with a urinary tract infection by her primary care doctor and started on a secondary antibiotic.  She states that she has the itch occasional twinge of pain but not enough to require any pain medications.  She is eating well and having bowel function.  She denies any fevers, chills, nausea, vomiting, chest pain, shortness of breath, diarrhea, constipation.  Past Medical History:  Diagnosis Date  . Anginal pain ([place])   . 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 CHF (congestive heart failure) ([place])   . Diabetes ([place])   . Diverticulitis   . Hypertension   . PE (pulmonary thromboembolism) ([place])   . 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[facility])     Past Surgical History:  Procedure Laterality Date  . APPENDECTOMY N/A 02/24/2017   Procedure: Incidental  APPENDECTOMY;  Surgeon: [name], MD;  Location: [name] ORS;  Service: General;  Laterality: N/A;  . CARPAL TUNNEL RELEASE Bilateral   . COLON RESECTION SIGMOID N/A 11/02/2016   Procedure: COLON RESECTION SIGMOID;  Surgeon: [name], MD;  Location: [name];  Service: General;  Laterality: N/A;  . COLON SURGERY  11/02/2016  . COLONOSCOPY WITH PROPOFOL N/A 02/03/2017   Procedure: COLONOSCOPY WITH PROPOFOL;  Surgeon: Lin Landsman, MD;  Location: [facility] ENDOSCOPY;  Service: Gastroenterology;  Laterality: N/A;  . COLOSTOMY N/A 11/02/2016   Procedure: COLOSTOMY;  Surgeon: [name], MD;  Location: [name]S;   Service: General;  Laterality: N/A;  . COLOSTOMY REVERSAL N/A 02/24/2017   Procedure: COLOSTOMY REVERSAL, ostomy takedown, spleenic flexure mobilization, excision rectal stump/distal sigmoid, anastomosis with suture reinforcement;  Surgeon: [name], MD;  Location: [name];  Service: General;  Laterality: N/A;  . ILEO LOOP DIVERSION N/A 02/24/2017   Procedure: ILEO LOOP COLOSTOMY;  Surgeon: [name], MD;  Location: [name];  Service: General;  Laterality: N/A;  . ILEOSTOMY CLOSURE N/A 06/01/2017   Procedure: LOOP ILEOSTOMY TAKEDOWN;  Surgeon: [name], MD;  Location: [name];  Service: General;  Laterality: N/A;  . IVC FILTER INSERTION Right 10/2016  . KNEE SURGERY     torn menicus  . LAPAROSCOPY N/A 02/24/2017   Procedure: LAPAROSCOPY DIAGNOSTIC;  Surgeon: [name], MD;  Location: [name];  Service: General;  Laterality: N/A;  . LYSIS OF ADHESION N/A 02/24/2017   Procedure: LYSIS OF ADHESION;  Surgeon: [name], MD;  Location: [name];  Service: General;  Laterality: N/A;  . PULMONARY VENOGRAPHY N/A 11/19/2016   Procedure: Pulmonary Venography; IVC filter placement; possible pulmonary thrombectomy;  Surgeon: Katha Cabal, MD;  Location: [place] [facility];  Service: Cardiovascular;  Laterality: N/A;  . SIGMOIDOSCOPY N/A 11/13/2016   Procedure: endoscopic  flexible SIGMOIDOSCOPY;  Surgeon: Florene Glen, MD;  Location: [name];  Service: General;  Laterality: N/A;  . SIGMOIDOSCOPY N/A 05/19/2017   Procedure: Beryle Quant;  Surgeon: [name], MD;  Location: [name];  Service: General;  Laterality: N/A;  Family History  Problem Relation Age of Onset  . Diabetes Mother   . Heart disease Mother   . Cancer Mother   . Heart disease Father   . Diabetes Sister   . Heart disease Sister     Social History:  reports that  has never smoked. she has never used smokeless tobacco. She reports that she does not drink alcohol or  use drugs.  Allergies:  Allergies  Allergen Reactions  . Adhesive [Tape] Other (See Comments)    Pt reports, when removing tape from skin most tapes pull her skin off. Ok to use paper tape  . Morphine Nausea Only  . Other Hives and Other (See Comments)    Chlorhexadine wipes/CHG wipes Pt reports, when removing tape from skin most tapes pull her skin off. Ok to use paper tape  . Latex Rash  . Morphine And Related Nausea And Vomiting    Medications reviewed.    ROS A multipoint review of systems was completed, all pertinent positives and negatives are documented within the HPI and the remainder are negative   BP (!) 159/90   Pulse 84   Temp (!) 94.6 F (34.8 C) (Oral)   Ht 5\' 3"  (1.6 m)   Wt 98.4 kg (217 lb)   BMI 38.44 kg/m   Physical Exam  General: No acute distress Chest: Clear to auscultation Heart: Regular rhythm Abdomen: Soft, nontender, nondistended.  Right lower quadrant prior ileostomy site with healthy-appearing granulation tissue along the length of the wound.  No evidence of purulence or spreading erythema.  Approximately 3 mm in greatest diameter of opening along the wound.   No results found for this or any previous visit (from the past 48 hour(s)). No results found.  Assessment/Plan:  1. Aftercare following surgery 67 year old female status post loop ileostomy takedown.  Doing very well.  Discussed continuing local wound care until the skin fully heals.  Discussed that should take another 1-2 weeks.  Provided with standard postoperative precautions.  She will follow-up in clinic in 2 weeks for an additional wound check.     [name], MD FACS General Surgeon  06/23/2017,10:07 AM

## 2017-06-23 NOTE — Patient Instructions (Signed)
Please keep a dressing over the wound.  Please continue the Antibiotic.  Please see your follow up appointment listed below.

## 2017-07-01 DIAGNOSIS — M1611 Unilateral primary osteoarthritis, right hip: Secondary | ICD-10-CM | POA: Diagnosis not present

## 2017-07-01 DIAGNOSIS — M5416 Radiculopathy, lumbar region: Secondary | ICD-10-CM | POA: Diagnosis not present

## 2017-07-01 DIAGNOSIS — M5136 Other intervertebral disc degeneration, lumbar region: Secondary | ICD-10-CM | POA: Diagnosis not present

## 2017-07-05 ENCOUNTER — Other Ambulatory Visit: Payer: Self-pay | Admitting: Internal Medicine

## 2017-07-05 DIAGNOSIS — M5416 Radiculopathy, lumbar region: Secondary | ICD-10-CM | POA: Diagnosis not present

## 2017-07-05 DIAGNOSIS — M5126 Other intervertebral disc displacement, lumbar region: Secondary | ICD-10-CM | POA: Diagnosis not present

## 2017-07-07 ENCOUNTER — Ambulatory Visit (INDEPENDENT_AMBULATORY_CARE_PROVIDER_SITE_OTHER): Payer: PPO | Admitting: General Surgery

## 2017-07-07 ENCOUNTER — Encounter: Payer: Self-pay | Admitting: General Surgery

## 2017-07-07 DIAGNOSIS — Z4889 Encounter for other specified surgical aftercare: Secondary | ICD-10-CM

## 2017-07-07 NOTE — Progress Notes (Signed)
Outpatient Surgical Follow Up  07/07/2017  Kara [name] is an 67 y.o. female.   No chief complaint on file.   HPI: 67 year old female returns to clinic for follow-up now 5 weeks status post loop ileostomy takedown.  Patient reports she is having no pain.  She is eating well and having normal bowel function.  She has one [employer] area on the lateral aspect of the ileostomy site that continues to have a little bit of drainage and a scab.  She denies any fevers, chills, nausea, vomiting, chest pain, shortness of breath, diarrhea, constipation.  She has already returned to work.  Past Medical History:  Diagnosis Date  . Anginal pain ([place])   . 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 CHF (congestive heart failure) ([place])   . Diabetes ([place])   . Diverticulitis   . Hypertension   . PE (pulmonary thromboembolism) ([place])   . 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[facility])     Past Surgical History:  Procedure Laterality Date  . APPENDECTOMY N/A 02/24/2017   Procedure: Incidental  APPENDECTOMY;  Surgeon: [name], MD;  Location: [name];  Service: General;  Laterality: N/A;  . CARPAL TUNNEL RELEASE Bilateral   . COLON RESECTION SIGMOID N/A 11/02/2016   Procedure: COLON RESECTION SIGMOID;  Surgeon: [name], MD;  Location: [name];  Service: General;  Laterality: N/A;  . COLON SURGERY  11/02/2016  . COLONOSCOPY WITH PROPOFOL N/A 02/03/2017   Procedure: COLONOSCOPY WITH PROPOFOL;  Surgeon: Lin Landsman, MD;  Location: [facility] ENDOSCOPY;  Service: Gastroenterology;  Laterality: N/A;  . COLOSTOMY N/A 11/02/2016   Procedure: COLOSTOMY;  Surgeon: [name], MD;  Location: [name]S;  Service: General;  Laterality: N/A;  . COLOSTOMY REVERSAL N/A 02/24/2017   Procedure: COLOSTOMY REVERSAL, ostomy takedown, spleenic flexure mobilization, excision rectal stump/distal sigmoid, anastomosis with suture reinforcement;  Surgeon: [name], MD;   Location: [name];  Service: General;  Laterality: N/A;  . ILEO LOOP DIVERSION N/A 02/24/2017   Procedure: ILEO LOOP COLOSTOMY;  Surgeon: [name], MD;  Location: [name];  Service: General;  Laterality: N/A;  . ILEOSTOMY CLOSURE N/A 06/01/2017   Procedure: LOOP ILEOSTOMY TAKEDOWN;  Surgeon: [name], MD;  Location: [name];  Service: General;  Laterality: N/A;  . IVC FILTER INSERTION Right 10/2016  . KNEE SURGERY     torn menicus  . LAPAROSCOPY N/A 02/24/2017   Procedure: LAPAROSCOPY DIAGNOSTIC;  Surgeon: [name], MD;  Location: [name];  Service: General;  Laterality: N/A;  . LYSIS OF ADHESION N/A 02/24/2017   Procedure: LYSIS OF ADHESION;  Surgeon: [name], MD;  Location: [name];  Service: General;  Laterality: N/A;  . PULMONARY VENOGRAPHY N/A 11/19/2016   Procedure: Pulmonary Venography; IVC filter placement; possible pulmonary thrombectomy;  Surgeon: Katha Cabal, MD;  Location: [place] [facility];  Service: Cardiovascular;  Laterality: N/A;  . SIGMOIDOSCOPY N/A 11/13/2016   Procedure: endoscopic  flexible SIGMOIDOSCOPY;  Surgeon: Florene Glen, MD;  Location: [name];  Service: General;  Laterality: N/A;  . SIGMOIDOSCOPY N/A 05/19/2017   Procedure: FLEXIBLE SIGMOIDOSCOPY;  Surgeon: [name], MD;  Location: [name];  Service: General;  Laterality: N/A;    Family History  Problem Relation Age of Onset  . Diabetes Mother   . Heart disease Mother   . Cancer Mother   . Heart disease Father   . Diabetes Sister   . Heart disease Sister  Social History:  reports that  has never smoked. she has never used smokeless tobacco. She reports that she does not drink alcohol or use drugs.  Allergies:  Allergies  Allergen Reactions  . Adhesive [Tape] Other (See Comments)    Pt reports, when removing tape from skin most tapes pull her skin off. Ok to use paper tape  . Morphine Nausea Only  . Other Hives and Other (See Comments)     Chlorhexadine wipes/CHG wipes Pt reports, when removing tape from skin most tapes pull her skin off. Ok to use paper tape  . Latex Rash  . Morphine And Related Nausea And Vomiting    Medications reviewed.    ROS A multipoint review of systems was completed.  All pertinent positives and negatives are documented within the HPI and the remainder are negative.   There were no vitals taken for this visit.  Physical Exam General: No acute distress Chest: Clear to auscultation Heart: Regular rate and rhythm Abdomen: Soft, nontender, nondistended.  1 mm scab present on the lateral aspect of her ileostomy site.  No evidence of spreading erythema, expressible purulence, or wound complication.    No results found for this or any previous visit (from the past 48 hour(s)). No results found.  Assessment/Plan:  1. Aftercare following surgery 67 year old female status post loop ileostomy takedown.  Doing very well.  Provided with standard postoperative precautions.  She voiced understanding and will follow-up as needed basis.     [name], MD FACS General Surgeon  07/07/2017,4:43 PM

## 2017-07-08 ENCOUNTER — Encounter: Payer: PPO | Admitting: Surgery

## 2017-07-14 DIAGNOSIS — M4726 Other spondylosis with radiculopathy, lumbar region: Secondary | ICD-10-CM | POA: Diagnosis not present

## 2017-07-14 DIAGNOSIS — M5136 Other intervertebral disc degeneration, lumbar region: Secondary | ICD-10-CM | POA: Diagnosis not present

## 2017-07-14 DIAGNOSIS — M48061 Spinal stenosis, lumbar region without neurogenic claudication: Secondary | ICD-10-CM | POA: Diagnosis not present

## 2017-07-15 ENCOUNTER — Other Ambulatory Visit: Payer: Self-pay | Admitting: Internal Medicine

## 2017-07-15 DIAGNOSIS — Z1231 Encounter for screening mammogram for malignant neoplasm of breast: Secondary | ICD-10-CM

## 2017-08-04 ENCOUNTER — Ambulatory Visit (INDEPENDENT_AMBULATORY_CARE_PROVIDER_SITE_OTHER): Payer: PPO | Admitting: Vascular Surgery

## 2017-08-04 ENCOUNTER — Encounter (INDEPENDENT_AMBULATORY_CARE_PROVIDER_SITE_OTHER): Payer: Self-pay | Admitting: Vascular Surgery

## 2017-08-04 VITALS — BP 142/75 | HR 80 | Resp 18 | Wt 220.2 lb

## 2017-08-04 DIAGNOSIS — G4762 Sleep related leg cramps: Secondary | ICD-10-CM

## 2017-08-04 DIAGNOSIS — Z95828 Presence of other vascular implants and grafts: Secondary | ICD-10-CM

## 2017-08-04 DIAGNOSIS — E119 Type 2 diabetes mellitus without complications: Secondary | ICD-10-CM

## 2017-08-04 DIAGNOSIS — I10 Essential (primary) hypertension: Secondary | ICD-10-CM | POA: Diagnosis not present

## 2017-08-04 DIAGNOSIS — I2782 Chronic pulmonary embolism: Secondary | ICD-10-CM | POA: Diagnosis not present

## 2017-08-05 ENCOUNTER — Encounter (INDEPENDENT_AMBULATORY_CARE_PROVIDER_SITE_OTHER): Payer: Self-pay | Admitting: Vascular Surgery

## 2017-08-05 NOTE — Progress Notes (Signed)
MRN : [id number]  Kara [name] is a 67 y.o. (10-28-1950) female who presents with chief complaint of  Chief Complaint  Patient presents with  . Follow-up    ivc filter removal  .  History of Present Illness:  The patient returns the office for follow-up evaluation of her DVT and bilateral pulmonary embolism.  The patient is a 67 year old woman who underwent a sigmoid colon resection  several months ago ago secondary to perforation and acute diverticulitis.  Not too long after her bowel resection she presented to [facility] with increasing shortness of breath. In the emergency room her O2 saturations were 88% on room air. After administration of 4 L of oxygen she was still only maintaining saturations of 94%. She also related that for several days her lower extremities have been swelling significantly but she denies calf or leg pain.  Subsequently she underwent filter placement.  She is done well from this.  The majority of her lower extremity symptoms of pain as well as swelling have been eliminated.  She returns to the office requesting filter removal.    Current Meds  Medication Sig  . aspirin EC 81 MG tablet Take 81 mg by mouth daily.  . candesartan (ATACAND) 16 MG tablet Take by mouth.  . cyclobenzaprine (FLEXERIL) 10 MG tablet Take 10 mg by mouth at bedtime as needed for muscle spasms.   Marland Kitchen glipiZIDE (GLUCOTROL XL) 5 MG 24 hr tablet Take 5 mg by mouth daily with breakfast.   . Multiple Vitamin (MULTI-VITAMINS) TABS Take 1 tablet by mouth daily.    Past Medical History:  Diagnosis Date  . Anginal pain ([place])   . 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 CHF (congestive heart failure) ([place])   . Diabetes ([place])   . Diverticulitis   . Hypertension   . PE (pulmonary thromboembolism) ([place])   . 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[facility])     Past Surgical History:  Procedure Laterality Date  . APPENDECTOMY N/A 02/24/2017     Procedure: Incidental  APPENDECTOMY;  Surgeon: [name], MD;  Location: [name] ORS;  Service: General;  Laterality: N/A;  . CARPAL TUNNEL RELEASE Bilateral   . COLON RESECTION SIGMOID N/A 11/02/2016   Procedure: COLON RESECTION SIGMOID;  Surgeon: [name], MD;  Location: [name] ORS;  Service: General;  Laterality: N/A;  . COLON SURGERY  11/02/2016  . COLONOSCOPY WITH PROPOFOL N/A 02/03/2017   Procedure: COLONOSCOPY WITH PROPOFOL;  Surgeon: Lin Landsman, MD;  Location: [facility] ENDOSCOPY;  Service: Gastroenterology;  Laterality: N/A;  . COLOSTOMY N/A 11/02/2016   Procedure: COLOSTOMY;  Surgeon: [name], MD;  Location: [name] ORS;  Service: General;  Laterality: N/A;  . COLOSTOMY REVERSAL N/A 02/24/2017   Procedure: COLOSTOMY REVERSAL, ostomy takedown, spleenic flexure mobilization, excision rectal stump/distal sigmoid, anastomosis with suture reinforcement;  Surgeon: [name], MD;  Location: [name] ORS;  Service: General;  Laterality: N/A;  . ILEO LOOP DIVERSION N/A 02/24/2017   Procedure: ILEO LOOP COLOSTOMY;  Surgeon: [name], MD;  Location: [name] ORS;  Service: General;  Laterality: N/A;  . ILEOSTOMY CLOSURE N/A 06/01/2017   Procedure: LOOP ILEOSTOMY TAKEDOWN;  Surgeon: [name], MD;  Location: [name] ORS;  Service: General;  Laterality: N/A;  . IVC FILTER INSERTION Right 10/2016  . KNEE SURGERY     torn menicus  . LAPAROSCOPY N/A 02/24/2017   Procedure: LAPAROSCOPY DIAGNOSTIC;  Surgeon: [name], MD;  Location:  [name] ORS;  Service: General;  Laterality: N/A;  . LYSIS OF ADHESION N/A 02/24/2017   Procedure: LYSIS OF ADHESION;  Surgeon: [name], MD;  Location: [name] ORS;  Service: General;  Laterality: N/A;  . PULMONARY VENOGRAPHY N/A 11/19/2016   Procedure: Pulmonary Venography; IVC filter placement; possible pulmonary thrombectomy;  Surgeon: Katha Cabal, MD;  Location: [facility];  Service: Cardiovascular;  Laterality: N/A;   . SIGMOIDOSCOPY N/A 11/13/2016   Procedure: endoscopic  flexible SIGMOIDOSCOPY;  Surgeon: Florene Glen, MD;  Location: [name] ORS;  Service: General;  Laterality: N/A;  . SIGMOIDOSCOPY N/A 05/19/2017   Procedure: Beryle Quant;  Surgeon: [name], MD;  Location: [name] ORS;  Service: General;  Laterality: N/A;    Social History Social History   Tobacco Use  . Smoking status: Never Smoker  . Smokeless tobacco: Never Used  Substance Use Topics  . Alcohol use: No    Alcohol/week: 0.0 oz  . Drug use: No    Family History Family History  Problem Relation Age of Onset  . Diabetes Mother   . Heart disease Mother   . Cancer Mother   . Heart disease Father   . Diabetes Sister   . Heart disease Sister     Allergies  Allergen Reactions  . Adhesive [Tape] Other (See Comments)    Pt reports, when removing tape from skin most tapes pull her skin off. Ok to use paper tape  . Morphine Nausea Only  . Other Hives and Other (See Comments)    Chlorhexadine wipes/CHG wipes, Pt reports, when removing tape from skin most tapes pull her skin off. Ok to use paper tape  . Latex Rash  . Morphine And Related Nausea And Vomiting     REVIEW OF SYSTEMS (Negative unless checked)  Constitutional: [] Weight loss  [] Fever  [] Chills Cardiac: [] Chest pain   [] Chest pressure   [] Palpitations   [] Shortness of breath when laying flat   [] Shortness of breath with exertion. Vascular:  [] Pain in legs with walking   [] Pain in legs at rest  [x] History of DVT   [] Phlebitis   [x] Swelling in legs   [] Varicose veins   [] Non-healing ulcers Pulmonary:   [] Uses home oxygen   [] Productive cough   [] Hemoptysis   [] Wheeze  [] COPD   [] Asthma Neurologic:  [] Dizziness   [] Seizures   [] History of stroke   [] History of TIA  [] Aphasia   [] Vissual changes   [] Weakness or numbness in arm   [] Weakness or numbness in leg Musculoskeletal:   [] Joint swelling   [] Joint pain   [] Low back pain Hematologic:  [] Easy  bruising  [] Easy bleeding   [] Hypercoagulable state   [] Anemic Gastrointestinal:  [] Diarrhea   [] Vomiting  [] Gastroesophageal reflux/heartburn   [] Difficulty swallowing. Genitourinary:  [] Chronic kidney disease   [] Difficult urination  [] Frequent urination   [] Blood in urine Skin:  [] Rashes   [] Ulcers  Psychological:  [] History of anxiety   []  History of major depression.  Physical Examination  Vitals:   08/04/17 1532  BP: (!) 142/75  Pulse: 80  Resp: 18  Weight: 220 lb 3.2 oz (99.9 kg)   Body mass index is 39.01 kg/m. Gen: WD/WN, NAD Head: [place]/AT, No temporalis wasting.  Ear/Nose/Throat: Hearing grossly intact, nares w/o erythema or drainage Eyes: PER, EOMI, sclera nonicteric.  Neck: Supple, no large masses.   Pulmonary:  Good air movement, no audible wheezing bilaterally, no use of accessory muscles.  Cardiac: RRR, no JVD Vascular:  Vessel Right Left  Radial Palpable Palpable  PT Palpable Palpable  DP Palpable Palpable  Gastrointestinal: Non-distended. No guarding/no peritoneal signs.  Musculoskeletal: M/S 5/5 throughout.  Degenerative changes to multiple joints.  Neurologic: CN 2-12 intact. Symmetrical.  Speech is fluent. Motor exam as listed above. Psychiatric: Judgment intact, Mood & affect appropriate for pt's clinical situation. Dermatologic: No rashes or ulcers noted.  No changes consistent with cellulitis. Lymph : No lichenification or skin changes of chronic lymphedema.  CBC Lab Results  Component Value Date   WBC 9.3 06/03/2017   HGB 12.7 06/03/2017   HCT 37.8 06/03/2017   MCV 90.4 06/03/2017   PLT 186 06/03/2017    BMET    Component Value Date/Time   NA 140 06/03/2017 0415   NA 139 05/30/2013 0520   K 3.7 06/03/2017 0415   K 3.6 05/30/2013 0520   CL 108 06/03/2017 0415   CL 108 (H) 05/30/2013 0520   CO2 22 06/03/2017 0415   CO2 27 05/30/2013 0520   GLUCOSE 141 (H) 06/03/2017 0415   GLUCOSE 98 05/30/2013 0520   BUN 11 06/03/2017 0415   BUN 11  05/30/2013 0520   CREATININE 0.62 06/03/2017 0415   CREATININE 0.90 05/30/2013 0520   CALCIUM 8.1 (L) 06/03/2017 0415   CALCIUM 7.9 (L) 05/30/2013 0520   GFRNONAA >60 06/03/2017 0415   GFRNONAA >60 05/30/2013 0520   GFRAA >60 06/03/2017 0415   GFRAA >60 05/30/2013 0520   CrCl cannot be calculated (Patient's most recent lab result is older than the maximum 21 days allowed.).  COAG No results found for: INR, PROTIME  Radiology No results found.   Assessment/Plan 1. Other chronic pulmonary embolism without acute cor pulmonale (HCC)  IVC filter will be removed as an outpatient. Risk and benefits were reviewed the patient.  Indications for the procedure were reviewed.  All questions were answered, the patient agrees to proceed.   I have had a long discussion with the patient regarding DVT and post phlebitic changes such as swelling and why it  causes symptoms such as pain.  The patient will wear graduated compression stockings class 1 (20-30 mmHg) on a daily basis a prescription was given. The patient will  beginning wearing the stockings first thing in the morning and removing them in the evening. The patient is instructed specifically not to sleep in the stockings.  In addition, behavioral modification including elevation during the day and avoidance of prolonged dependency will be initiated.    The patient will follow-up with me post filter retrieval  2. S/P IVC filter See #1  3. Essential hypertension Continue antihypertensive medications as already ordered, these medications have been reviewed and there are no changes at this time.   4. Type 2 diabetes mellitus without complication, without long-term current use of insulin (HCC) Continue hypoglycemic medications as already ordered, these medications have been reviewed and there are no changes at this time.  Hgb A1C to be monitored as already arranged by primary service   5. Nocturnal leg cramps Recommend:  The patient is  describing Charley horse type leg cramps. No invasive studies, angiography or surgery at this time.    I have reviewed homeopathic remedies such as Cider vinegar or mustard; placing a bar of soap at the bottom of the bed. Quinine is also an option Magnesium supplementation at bedtime was also reviewed.  The patient should continue walking and begin a more formal exercise program.  The patient should continue antiplatelet therapy and aggressive treatment of the lipid abnormalities  The patient should continue wearing graduated compression socks 10-15 mmHg strength to control any mild edema.  The patient will follow up with me on a PRN basis.     Hortencia Pilar, MD  08/05/2017 12:56 PM

## 2017-08-08 ENCOUNTER — Other Ambulatory Visit (INDEPENDENT_AMBULATORY_CARE_PROVIDER_SITE_OTHER): Payer: Self-pay | Admitting: Vascular Surgery

## 2017-08-08 MED ORDER — CEFAZOLIN SODIUM-DEXTROSE 2-4 GM/100ML-% IV SOLN
2.0000 g | Freq: Once | INTRAVENOUS | Status: AC
  Administered 2017-08-09: 2 g via INTRAVENOUS

## 2017-08-09 ENCOUNTER — Ambulatory Visit
Admission: RE | Admit: 2017-08-09 | Discharge: 2017-08-09 | Disposition: A | Discharge status: Home / Self Care | Payer: PPO | Source: Ambulatory Visit | Attending: Vascular Surgery | Admitting: Vascular Surgery

## 2017-08-09 ENCOUNTER — Encounter
Admission: RE | Disposition: A | Discharge status: Home / Self Care | Payer: Self-pay | Source: Ambulatory Visit | Attending: Vascular Surgery

## 2017-08-09 ENCOUNTER — Encounter: Payer: Self-pay | Admitting: Vascular Surgery

## 2017-08-09 DIAGNOSIS — Z86711 Personal history of pulmonary embolism: Secondary | ICD-10-CM | POA: Insufficient documentation

## 2017-08-09 DIAGNOSIS — Z4689 Encounter for fitting and adjustment of other specified devices: Secondary | ICD-10-CM | POA: Insufficient documentation

## 2017-08-09 DIAGNOSIS — Z86718 Personal history of other venous thrombosis and embolism: Secondary | ICD-10-CM | POA: Diagnosis not present

## 2017-08-09 DIAGNOSIS — Z452 Encounter for adjustment and management of vascular access device: Secondary | ICD-10-CM | POA: Diagnosis not present

## 2017-08-09 DIAGNOSIS — J810 Acute pulmonary edema: Secondary | ICD-10-CM

## 2017-08-09 HISTORY — PX: IVC FILTER REMOVAL: CATH118246

## 2017-08-09 LAB — GLUCOSE, CAPILLARY
GLUCOSE-CAPILLARY: 75 mg/dL (ref 65–99)
Glucose-Capillary: 112 mg/dL — ABNORMAL HIGH (ref 65–99)
Glucose-Capillary: 104 mg/dL — ABNORMAL HIGH (ref 65–99)

## 2017-08-09 SURGERY — IVC FILTER REMOVAL
Anesthesia: Moderate Sedation

## 2017-08-09 MED ORDER — DIPHENHYDRAMINE HCL 50 MG/ML IJ SOLN
INTRAMUSCULAR | Status: AC
  Filled 2017-08-09: qty 1

## 2017-08-09 MED ORDER — FENTANYL CITRATE (PF) 100 MCG/2ML IJ SOLN
INTRAMUSCULAR | Status: DC | PRN
  Administered 2017-08-09 (×3): 50 ug via INTRAVENOUS

## 2017-08-09 MED ORDER — FENTANYL CITRATE (PF) 100 MCG/2ML IJ SOLN
INTRAMUSCULAR | Status: AC
  Filled 2017-08-09: qty 2

## 2017-08-09 MED ORDER — ONDANSETRON HCL 4 MG/2ML IJ SOLN: 4.0000 mg | Freq: Four times a day (QID) | INTRAMUSCULAR | Status: DC | PRN

## 2017-08-09 MED ORDER — DEXTROSE 50 % IV SOLN
INTRAVENOUS | Status: AC
  Administered 2017-08-09: 25 mL via INTRAVENOUS
  Filled 2017-08-09: qty 50

## 2017-08-09 MED ORDER — HYDROMORPHONE HCL 1 MG/ML IJ SOLN: 1.0000 mg | Freq: Once | INTRAMUSCULAR | Status: DC | PRN

## 2017-08-09 MED ORDER — DEXTROSE 50 % IV SOLN
25.0000 mL | Freq: Once | INTRAVENOUS | Status: AC
  Administered 2017-08-09: 25 mL via INTRAVENOUS

## 2017-08-09 MED ORDER — MIDAZOLAM HCL 5 MG/5ML IJ SOLN
INTRAMUSCULAR | Status: AC
  Filled 2017-08-09: qty 5

## 2017-08-09 MED ORDER — HEPARIN (PORCINE) IN NACL 2-0.9 UNIT/ML-% IJ SOLN
INTRAMUSCULAR | Status: AC
  Filled 2017-08-09: qty 500

## 2017-08-09 MED ORDER — LIDOCAINE-EPINEPHRINE (PF) 1 %-1:200000 IJ SOLN
INTRAMUSCULAR | Status: AC
  Filled 2017-08-09: qty 30

## 2017-08-09 MED ORDER — SODIUM CHLORIDE 0.9 % IV SOLN
INTRAVENOUS | Status: DC
  Administered 2017-08-09: 11:00:00 via INTRAVENOUS

## 2017-08-09 MED ORDER — MIDAZOLAM HCL 2 MG/2ML IJ SOLN
INTRAMUSCULAR | Status: DC | PRN
  Administered 2017-08-09: 2 mg via INTRAVENOUS
  Administered 2017-08-09 (×2): 1 mg via INTRAVENOUS

## 2017-08-09 MED ORDER — IOPAMIDOL (ISOVUE-300) INJECTION 61%
INTRAVENOUS | Status: DC | PRN
  Administered 2017-08-09: 15 mL via INTRAVENOUS

## 2017-08-09 SURGICAL SUPPLY — 7 items
CATH BEACON 5 .035 65 KMP TIP (CATHETERS) ×3 IMPLANT
GUIDEWIRE SUPER STIFF .035X180 (WIRE) ×3 IMPLANT
NEEDLE ENTRY 21GA 7CM ECHOTIP (NEEDLE) ×3 IMPLANT
PACK ANGIOGRAPHY (CUSTOM PROCEDURE TRAY) ×3 IMPLANT
SET INTRO CAPELLA COAXIAL (SET/KITS/TRAYS/PACK) ×3 IMPLANT
SET VENACAVA FILTER RETRIEVAL (MISCELLANEOUS) ×3 IMPLANT
WIRE J 3MM .035X145CM (WIRE) ×3 IMPLANT

## 2017-08-09 NOTE — Op Note (Signed)
  OPERATIVE NOTE   PRE-OPERATIVE DIAGNOSIS: DVT with PE  POST-OPERATIVE DIAGNOSIS: Same  PROCEDURE: 1. Retrieval of IVC Filter 2. Inferior Vena Cavagram  SURGEON: Katha Cabal, M.D.  ANESTHESIA:  Conscious sedation was administered under my direct supervision by the interventional radiology RN. IV Versed plus fentanyl were utilized. Continuous ECG, pulse oximetry and blood pressure was monitored throughout the entire procedure. Conscious sedation was for a total of 25 minutes.  ESTIMATED BLOOD LOSS: Minimal cc  FINDING(S):inferior vena cava is widely patent filter is in place in good position. Filter is removed without incident  SPECIMEN(S):  IVC filter intact  INDICATIONS:   Kara [name] is a 67 y.o. female who presents with DVT and PE. The patient has now tolerated anticoagulation for several months. Therefore, the IVC filter is recommended to be removed. The risks and benefits were reviewed with the patient all questions were answered and they agreed to proceed with IVC filter retrieval. Oral anticoagulation will be continued.  DESCRIPTION: After obtaining full informed written consent, the patient was brought back to the Special Procedure Suite and placed in the supine position.  The patient received IV antibiotics prior to induction.  After obtaining adequate sedation, the patient was prepped and draped in the standard fashion and appropriate time out is called.     Ultrasound was placed in a sterile sleeve.The right neck was then imaged with ultrasound.   Jugular vein was identified it is echolucent and homogeneous indicating patency. 1% lidocaine is then infiltrated under ultrasound visualization and subsequently a Seldinger needle is inserted under real-time ultrasound guidance.  J-wire is then advanced into the inferior vena cava under fluoroscopic guidance.  Kumpe catheter was used to negotiate the J-wire into the IVC.  Sheath would not track over the floppy  J-wire and therefore the Kumpe was reintroduced and an Amplatz Super Stiff was advanced and positioned with its tip at the confluence of the iliac veins.  The sheath then advanced over the Amplatz without any difficulty whatsoever.  With the tip of the sheath at the confluence of the iliac veins inferior vena caval imaging is performed.  After review of the image the sheath is repositioned to above the filter and the snares introduced. Snares opened and the hook is secured without difficulty. The filter is then collapsed within the sheath and removed without difficulty.  Sheath is removed by pressures held the patient tolerated the procedure well and there were no immediate complications.  Interpretation: inferior vena cava is widely patent filter is in place in good position. Filter is removed without incident.     COMPLICATIONS: None  CONDITION: [name], M.D. [facility] Office: [phone]   08/09/2017, 1:14 PM

## 2017-08-09 NOTE — Discharge Instructions (Signed)
Inferior Vena Cava Filter Removal, Care After This sheet gives you information about how to care for yourself after your procedure. Your health care provider may also give you more specific instructions. If you have problems or questions, contact your health care provider. What can I expect after the procedure? After your procedure, it is common to have:  Mild pain in the area where the filter was removed.  Mild bruising in the area where the filter was removed.  Follow these instructions at home: Removal  site care  Follow instructions from your health care provider about how to take care of the site where a catheter was removed at your neck.. Make sure you: ? Wash your hands with soap and water before you change your bandage (dressing). If soap and water are not available, use hand sanitizer. ? Change your dressing as told by your health care provider.  Check your removal site every day for signs of infection. Check for: ? More redness, swelling, or pain. ? More fluid or blood. ? Warmth. ? Pus or a bad smell.  Keep the removal site clean and dry.  Do not shower, bathe, use a hot tub, or let the dressing get wet until your health care provider approves. General instructions  Take over-the-counter and prescription medicines only as told by your health care provider.  Avoid heavy lifting or hard activities for 48 hours after the procedure or as told by your health care provider.  Do not drive for 24 hours if you were given a a medicine to help you relax (sedative).  Do not drive or use heavy machinery while taking prescription pain medicine.  Do not go back to school or work until your health care provider approves.  Keep all follow-up visits as told by your health care provider. This is important. Contact a health care provider if:  You have more redness, swelling, or pain around your removal site.  You have more fluid or blood coming from you removalr site.  Your removal  site feels warm to the touch.  You have pus or a bad smell coming from your  removal site.  You have a fever.  You are dizzy.  You have nausea and vomiting.  You develop a rash. Get help right away if:  You develop chest pain, a cough, or difficulty breathing.  You develop shortness of breath, feel faint, or pass out.  You cough up blood.  You have severe pain in your abdomen.  You develop swelling and discoloration or pain in your legs.  Your legs become pale and cold or blue.  You develop weakness, difficulty moving your arms or legs, or balance problems.  You develop problems with speech or vision. These symptoms may represent a serious problem that is an emergency. Do not wait to see if the symptoms will go away. Get medical help right away. Call your local emergency services (911 in the [place] Do not drive yourself to the hospital. Summary  After your removal procedure, it is common to have mild pain and bruising.  Do not shower, bathe, use a hot tub, or let the dressing get wet until your health care provider approves. Every day, check for signs of infection where a catheter was removalGroin Insertion Instructions-If you lose feeling or develop tingling or pain in your leg or foot after the procedure, please walk around first.  If the discomfort does not improve , contact your physician and proceed to the nearest emergency room.  Loss of  feeling in your leg might mean that a blockage has formed in the artery and this can be appropriately treated.  Limit your activity for the next two days after your procedure.  Avoid stooping, bending, heavy lifting or exertion as this may put pressure on the insertion site.  Resume normal activities in 48 hours.  You may shower after 24 hours but avoid excessive warm water and do not scrub the site.  Remove clear dressing in 48 hours.  If you have had a closure device inserted, do not soak in a tub bath or a hot tub for at least one  week.  No driving for 48 hours after discharge.  After the procedure, check the insertion site occasionally.  If any oozing occurs or there is apparent swelling, firm pressure over the site will prevent a bruise from forming.  You can not hurt anything by pressing directly on the site.  The pressure stops the bleeding by allowing a small clot to form.  If the bleeding continues after the pressure has been applied for more than 15 minutes, call 911 or go to the nearest emergency room.    The x-ray dye causes you to pass a considerate amount of urine.  For this reason, you will be asked to drink plenty of liquids after the procedure to prevent dehydration.  You may resume you regular diet.  Avoid caffeine products.    For pain at the site of your procedure, take non-aspirin medicines such as Tylenol.   Medications: A. Hold Metformin for 48 hours if applicable.  B. Continue taking all your present medications at home unless your doctor prescribes any changes. at your neck or groin (insertion site). This information is not intended to replace advice given to you by your health care provider. Make sure you discuss any questions you have with your health care provider. Document Released: 02/28/2013 Document Revised: 03/31/2016 Document Reviewed: 03/31/2016 Elsevier Interactive Patient Education  2017 [employer].

## 2017-08-09 NOTE — H&P (Signed)
[facility] History & Physical Update  The patient was interviewed and re-examined.  The patient's previous History and Physical has been reviewed and is unchanged.  There is no change in the plan of care. We plan to proceed with the scheduled procedure.  Hortencia Pilar, MD  08/09/2017, 12:20 PM

## 2017-08-10 ENCOUNTER — Ambulatory Visit
Admission: RE | Admit: 2017-08-10 | Discharge: 2017-08-10 | Disposition: A | Discharge status: Home / Self Care | Payer: PPO | Source: Ambulatory Visit | Attending: Internal Medicine | Admitting: Internal Medicine

## 2017-08-10 DIAGNOSIS — Z1231 Encounter for screening mammogram for malignant neoplasm of breast: Secondary | ICD-10-CM | POA: Diagnosis not present

## 2017-08-22 ENCOUNTER — Encounter (INDEPENDENT_AMBULATORY_CARE_PROVIDER_SITE_OTHER): Payer: Self-pay | Admitting: Vascular Surgery

## 2017-08-22 ENCOUNTER — Ambulatory Visit (INDEPENDENT_AMBULATORY_CARE_PROVIDER_SITE_OTHER): Payer: PPO | Admitting: Vascular Surgery

## 2017-08-22 VITALS — BP 160/80 | HR 91 | Resp 17 | Ht 61.0 in | Wt 228.0 lb

## 2017-08-22 DIAGNOSIS — Z95828 Presence of other vascular implants and grafts: Secondary | ICD-10-CM | POA: Diagnosis not present

## 2017-08-22 DIAGNOSIS — I10 Essential (primary) hypertension: Secondary | ICD-10-CM

## 2017-08-22 DIAGNOSIS — E119 Type 2 diabetes mellitus without complications: Secondary | ICD-10-CM | POA: Diagnosis not present

## 2017-08-22 DIAGNOSIS — M5136 Other intervertebral disc degeneration, lumbar region: Secondary | ICD-10-CM | POA: Diagnosis not present

## 2017-08-25 ENCOUNTER — Encounter (INDEPENDENT_AMBULATORY_CARE_PROVIDER_SITE_OTHER): Payer: Self-pay | Admitting: Vascular Surgery

## 2017-08-25 NOTE — Progress Notes (Signed)
MRN : [id number]  ISYSS ESPINAL is a 67 y.o. (June 13, 1950) female who presents with chief complaint of  Chief Complaint  Patient presents with  . Follow-up    2 week [name], IVC placement  .  History of Present Illness:    The patient presents to the office for evaluation of DVT.  DVT was identified remotely at [facility] by Duplex ultrasound.  The initial symptoms were pain and swelling in the lower extremity.  She is now s/p successful joint surgery and the filter has been removed:  PROCEDURE 08/09/2017: 1. Retrieval of IVC Filter 2. Inferior Vena Cavagram  The patient notes the leg continues to be very painful with dependency and swells quite a bite.  Symptoms are much better with elevation.  The patient notes minimal edema in the morning which steadily worsens throughout the day.    The patient has not been using compression therapy at this point.  No SOB or pleuritic chest pains.  No cough or hemoptysis.  No blood per rectum or blood in any sputum.  No excessive bruising per the patient.     No outpatient medications have been marked as taking for the 08/22/17 encounter (Office Visit) with Delana Meyer, Dolores Lory, MD.    Past Medical History:  Diagnosis Date  . Anginal pain ([place])   . 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 CHF (congestive heart failure) ([place])   . Diabetes ([place])   . Diverticulitis   . Hypertension   . PE (pulmonary thromboembolism) ([place])   . 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[facility])     Past Surgical History:  Procedure Laterality Date  . APPENDECTOMY N/A 02/24/2017   Procedure: Incidental  APPENDECTOMY;  Surgeon: [name], MD;  Location: [name] ORS;  Service: General;  Laterality: N/A;  . CARPAL TUNNEL RELEASE Bilateral   . COLON RESECTION SIGMOID N/A 11/02/2016   Procedure: COLON RESECTION SIGMOID;  Surgeon: [name], MD;  Location: [name] ORS;  Service: General;  Laterality: N/A;  . COLON SURGERY   11/02/2016  . COLONOSCOPY WITH PROPOFOL N/A 02/03/2017   Procedure: COLONOSCOPY WITH PROPOFOL;  Surgeon: Lin Landsman, MD;  Location: [facility] ENDOSCOPY;  Service: Gastroenterology;  Laterality: N/A;  . COLOSTOMY N/A 11/02/2016   Procedure: COLOSTOMY;  Surgeon: [name], MD;  Location: [name] ORS;  Service: General;  Laterality: N/A;  . COLOSTOMY REVERSAL N/A 02/24/2017   Procedure: COLOSTOMY REVERSAL, ostomy takedown, spleenic flexure mobilization, excision rectal stump/distal sigmoid, anastomosis with suture reinforcement;  Surgeon: [name], MD;  Location: [name] ORS;  Service: General;  Laterality: N/A;  . ILEO LOOP DIVERSION N/A 02/24/2017   Procedure: ILEO LOOP COLOSTOMY;  Surgeon: [name], MD;  Location: [name] ORS;  Service: General;  Laterality: N/A;  . ILEOSTOMY CLOSURE N/A 06/01/2017   Procedure: LOOP ILEOSTOMY TAKEDOWN;  Surgeon: [name], MD;  Location: [name] ORS;  Service: General;  Laterality: N/A;  . IVC FILTER INSERTION Right 10/2016  . IVC FILTER REMOVAL N/A 08/09/2017   Procedure: IVC FILTER REMOVAL;  Surgeon: Katha Cabal, MD;  Location: [place] CV LAB;  Service: Cardiovascular;  Laterality: N/A;  . KNEE SURGERY     torn menicus  . LAPAROSCOPY N/A 02/24/2017   Procedure: LAPAROSCOPY DIAGNOSTIC;  Surgeon: [name], MD;  Location: [name] ORS;  Service: General;  Laterality: N/A;  . LYSIS OF ADHESION N/A 02/24/2017   Procedure: LYSIS OF ADHESION;  Surgeon: [name], MD;  Location: [name] ORS;  Service: General;  Laterality: N/A;  . PULMONARY VENOGRAPHY N/A 11/19/2016   Procedure: Pulmonary Venography; IVC filter placement; possible pulmonary thrombectomy;  Surgeon: Katha Cabal, MD;  Location: [place] CV LAB;  Service: Cardiovascular;  Laterality: N/A;  . SIGMOIDOSCOPY N/A 11/13/2016   Procedure: endoscopic  flexible SIGMOIDOSCOPY;  Surgeon: Florene Glen, MD;  Location: [name] ORS;  Service: General;  Laterality: N/A;    . SIGMOIDOSCOPY N/A 05/19/2017   Procedure: Beryle Quant;  Surgeon: [name], MD;  Location: [name] ORS;  Service: General;  Laterality: N/A;    Social History Social History   Tobacco Use  . Smoking status: Never Smoker  . Smokeless tobacco: Never Used  Substance Use Topics  . Alcohol use: No    Alcohol/week: 0.0 oz  . Drug use: No    Family History Family History  Problem Relation Age of Onset  . Diabetes Mother   . Heart disease Mother   . Cancer Mother   . Breast cancer Mother   . Heart disease Father   . Diabetes Sister   . Heart disease Sister   . Breast cancer Cousin     Allergies  Allergen Reactions  . Adhesive [Tape] Other (See Comments)    Pt reports, when removing tape from skin most tapes pull her skin off. Ok to use paper tape  . Morphine Nausea Only  . Other Hives and Other (See Comments)    Chlorhexadine wipes/CHG wipes, Pt reports, when removing tape from skin most tapes pull her skin off. Ok to use paper tape  . Latex Rash  . Morphine And Related Nausea And Vomiting     REVIEW OF SYSTEMS (Negative unless checked)  Constitutional: [] Weight loss  [] Fever  [] Chills Cardiac: [] Chest pain   [] Chest pressure   [] Palpitations   [] Shortness of breath when laying flat   [] Shortness of breath with exertion. Vascular:  [] Pain in legs with walking   [] Pain in legs at rest  [] History of DVT   [] Phlebitis   [] Swelling in legs   [] Varicose veins   [] Non-healing ulcers Pulmonary:   [] Uses home oxygen   [] Productive cough   [] Hemoptysis   [] Wheeze  [] COPD   [] Asthma Neurologic:  [] Dizziness   [] Seizures   [] History of stroke   [] History of TIA  [] Aphasia   [] Vissual changes   [] Weakness or numbness in arm   [] Weakness or numbness in leg Musculoskeletal:   [] Joint swelling   [] Joint pain   [] Low back pain Hematologic:  [] Easy bruising  [] Easy bleeding   [] Hypercoagulable state   [] Anemic Gastrointestinal:  [] Diarrhea   [] Vomiting  [] Gastroesophageal  reflux/heartburn   [] Difficulty swallowing. Genitourinary:  [] Chronic kidney disease   [] Difficult urination  [] Frequent urination   [] Blood in urine Skin:  [] Rashes   [] Ulcers  Psychological:  [] History of anxiety   []  History of major depression.  Physical Examination  Vitals:   08/22/17 1631  BP: (!) 160/80  Pulse: 91  Resp: 17  Weight: 228 lb (103.4 kg)  Height: 5\' 1"  (1.549 m)   Body mass index is 43.08 kg/m. Gen: WD/WN, NAD Head: [place]/AT, No temporalis wasting.  Ear/Nose/Throat: Hearing grossly intact, nares w/o erythema or drainage Eyes: PER, EOMI, sclera nonicteric.  Neck: Supple, no large masses.   Pulmonary:  Good air movement, no audible wheezing bilaterally, no use of accessory muscles.  Cardiac: RRR, no JVD Vascular:  Vessel Right Left  Radial Palpable Palpable  Gastrointestinal: Non-distended. No guarding/no peritoneal signs.  Musculoskeletal: M/S  5/5 throughout.  Multiple arthritic deformity no atrophy.  Neurologic: CN 2-12 intact. Symmetrical.  Speech is fluent. Motor exam as listed above. Psychiatric: Judgment intact, Mood & affect appropriate for pt's clinical situation. Dermatologic: No rashes or ulcers noted.  No changes consistent with cellulitis. Lymph : No lichenification or skin changes of chronic lymphedema.  CBC Lab Results  Component Value Date   WBC 9.3 06/03/2017   HGB 12.7 06/03/2017   HCT 37.8 06/03/2017   MCV 90.4 06/03/2017   PLT 186 06/03/2017    BMET    Component Value Date/Time   NA 140 06/03/2017 0415   NA 139 05/30/2013 0520   K 3.7 06/03/2017 0415   K 3.6 05/30/2013 0520   CL 108 06/03/2017 0415   CL 108 (H) 05/30/2013 0520   CO2 22 06/03/2017 0415   CO2 27 05/30/2013 0520   GLUCOSE 141 (H) 06/03/2017 0415   GLUCOSE 98 05/30/2013 0520   BUN 11 06/03/2017 0415   BUN 11 05/30/2013 0520   CREATININE 0.62 06/03/2017 0415   CREATININE 0.90 05/30/2013 0520   CALCIUM 8.1 (L) 06/03/2017 0415   CALCIUM 7.9 (L) 05/30/2013 0520     GFRNONAA >60 06/03/2017 0415   GFRNONAA >60 05/30/2013 0520   GFRAA >60 06/03/2017 0415   GFRAA >60 05/30/2013 0520   CrCl cannot be calculated (Patient's most recent lab result is older than the maximum 21 days allowed.).  COAG No results found for: INR, PROTIME  Radiology No results found.   Assessment/Plan 1. S/P IVC filter Recommend:  The patient is s/p successful removal of her IVC filter.    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  Patient will begin wearing graduated compression stockings on a daily basis, beginning first thing in the morning and removing them in the evening. The patient is instructed specifically not to sleep in the stockings.    The patient  will also begin using over-the-counter analgesics such as Motrin 600 mg po TID to help control the symptoms as needed.    In addition, behavioral modification including elevation during the day will be initiated, utilizing a recliner was recommended.  The patient is also instructed to continue exercising such as walking 4-5 times per week.  The Patient will follow up PRN if the symptoms worsen.  2. Type 2 diabetes mellitus without complication, without long-term current use of insulin (HCC) Continue hypoglycemic medications as already ordered, these medications have been reviewed and there are no changes at this time.  Hgb A1C to be monitored as already arranged by primary service   3. Essential hypertension Continue antihypertensive medications as already ordered, these medications have been reviewed and there are no changes at this time.   4. DDD (degenerative disc disease), lumbar Continue NSAID medications as already ordered, these medications have been reviewed and there are no changes at this time.  Continued activity and therapy was stressed.     Hortencia Pilar, MD  08/25/2017 7:12 AM

## 2017-08-30 DIAGNOSIS — R42 Dizziness and giddiness: Secondary | ICD-10-CM | POA: Diagnosis not present

## 2017-08-30 DIAGNOSIS — I10 Essential (primary) hypertension: Secondary | ICD-10-CM | POA: Diagnosis not present

## 2017-08-30 DIAGNOSIS — E119 Type 2 diabetes mellitus without complications: Secondary | ICD-10-CM | POA: Diagnosis not present

## 2017-08-30 DIAGNOSIS — M5136 Other intervertebral disc degeneration, lumbar region: Secondary | ICD-10-CM | POA: Diagnosis not present

## 2017-08-30 DIAGNOSIS — K5730 Diverticulosis of large intestine without perforation or abscess without bleeding: Secondary | ICD-10-CM | POA: Diagnosis not present

## 2017-09-02 ENCOUNTER — Other Ambulatory Visit: Payer: Self-pay | Admitting: *Deleted

## 2017-09-02 ENCOUNTER — Inpatient Hospital Stay
Admission: RE | Admit: 2017-09-02 | Discharge: 2017-09-02 | Disposition: A | Discharge status: Home / Self Care | Payer: Self-pay | Source: Ambulatory Visit | Attending: *Deleted | Admitting: *Deleted

## 2017-09-02 DIAGNOSIS — Z9289 Personal history of other medical treatment: Secondary | ICD-10-CM

## 2017-09-05 DIAGNOSIS — H8110 Benign paroxysmal vertigo, unspecified ear: Secondary | ICD-10-CM | POA: Diagnosis not present

## 2017-09-05 DIAGNOSIS — I10 Essential (primary) hypertension: Secondary | ICD-10-CM | POA: Diagnosis not present

## 2017-09-22 DIAGNOSIS — M48061 Spinal stenosis, lumbar region without neurogenic claudication: Secondary | ICD-10-CM | POA: Diagnosis not present

## 2017-09-22 DIAGNOSIS — M4726 Other spondylosis with radiculopathy, lumbar region: Secondary | ICD-10-CM | POA: Diagnosis not present

## 2017-09-22 DIAGNOSIS — M5136 Other intervertebral disc degeneration, lumbar region: Secondary | ICD-10-CM | POA: Diagnosis not present

## 2017-09-26 ENCOUNTER — Other Ambulatory Visit: Payer: Self-pay

## 2017-09-26 ENCOUNTER — Emergency Department: Payer: PPO

## 2017-09-26 ENCOUNTER — Emergency Department
Admission: EM | Admit: 2017-09-26 | Discharge: 2017-09-26 | Disposition: A | Discharge status: Home / Self Care | Payer: PPO | Attending: Emergency Medicine | Admitting: Emergency Medicine

## 2017-09-26 ENCOUNTER — Encounter: Payer: Self-pay | Admitting: Emergency Medicine

## 2017-09-26 DIAGNOSIS — Z91040 Latex allergy status: Secondary | ICD-10-CM | POA: Insufficient documentation

## 2017-09-26 DIAGNOSIS — X500XXA Overexertion from strenuous movement or load, initial encounter: Secondary | ICD-10-CM | POA: Insufficient documentation

## 2017-09-26 DIAGNOSIS — Z932 Ileostomy status: Secondary | ICD-10-CM | POA: Insufficient documentation

## 2017-09-26 DIAGNOSIS — S79911A Unspecified injury of right hip, initial encounter: Secondary | ICD-10-CM | POA: Diagnosis not present

## 2017-09-26 DIAGNOSIS — Y93E9 Activity, other interior property and clothing maintenance: Secondary | ICD-10-CM | POA: Diagnosis not present

## 2017-09-26 DIAGNOSIS — S3992XA Unspecified injury of lower back, initial encounter: Secondary | ICD-10-CM | POA: Diagnosis present

## 2017-09-26 DIAGNOSIS — Z7982 Long term (current) use of aspirin: Secondary | ICD-10-CM | POA: Insufficient documentation

## 2017-09-26 DIAGNOSIS — Z7984 Long term (current) use of oral hypoglycemic drugs: Secondary | ICD-10-CM | POA: Diagnosis not present

## 2017-09-26 DIAGNOSIS — I509 Heart failure, unspecified: Secondary | ICD-10-CM | POA: Diagnosis not present

## 2017-09-26 DIAGNOSIS — S39012A Strain of muscle, fascia and tendon of lower back, initial encounter: Secondary | ICD-10-CM | POA: Diagnosis not present

## 2017-09-26 DIAGNOSIS — E119 Type 2 diabetes mellitus without complications: Secondary | ICD-10-CM | POA: Diagnosis not present

## 2017-09-26 DIAGNOSIS — Y998 Other external cause status: Secondary | ICD-10-CM | POA: Diagnosis not present

## 2017-09-26 DIAGNOSIS — Y929 Unspecified place or not applicable: Secondary | ICD-10-CM | POA: Diagnosis not present

## 2017-09-26 DIAGNOSIS — Z79899 Other long term (current) drug therapy: Secondary | ICD-10-CM | POA: Insufficient documentation

## 2017-09-26 DIAGNOSIS — I110 Hypertensive heart disease with heart failure: Secondary | ICD-10-CM | POA: Diagnosis not present

## 2017-09-26 MED ORDER — METHYLPREDNISOLONE SODIUM SUCC 125 MG IJ SOLR
125.0000 mg | Freq: Once | INTRAMUSCULAR | Status: AC
  Administered 2017-09-26: 125 mg via INTRAMUSCULAR
  Filled 2017-09-26: qty 2

## 2017-09-26 MED ORDER — PREDNISONE 50 MG PO TABS: ORAL_TABLET | ORAL | 0 refills | Status: DC

## 2017-09-26 MED ORDER — TRAMADOL HCL 50 MG PO TABS: 50.0000 mg | ORAL_TABLET | Freq: Four times a day (QID) | ORAL | 0 refills | Status: DC | PRN

## 2017-09-26 NOTE — ED Provider Notes (Signed)
[facility] Emergency Department Provider Note  ____________________________________________  Time seen: Approximately 6:46 PM  I have reviewed the triage vital signs and the nursing notes.   HISTORY  Chief Complaint Hip Pain    HPI Kara [name] is a 67 y.o. female presents to the emergency department with right-sided low back pain that radiates somewhat into the right gluteal region.  Patient reports that she was pushing a freezer when she felt a pop.  Patient reports that she has experienced 10 out of 10 pain since.  She denies weakness or bowel or bladder incontinence.  No pain in the groin.  Patient denies any numbness or tingling in the lower extremities.  Patient reports that pain feels like a "dull ache".  She has been taking ibuprofen, which has not relieved her symptoms.   Past Medical History:  Diagnosis Date  . Anginal pain ([place])   . 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 CHF (congestive heart failure) ([place])   . Diabetes ([place])   . Diverticulitis   . Hypertension   . PE (pulmonary thromboembolism) ([place])   . 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[facility])     Patient Active Problem List   Diagnosis Date Noted  . Diverticulosis of colon 06/20/2017  . History of pulmonary embolism 06/20/2017  . S/P IVC filter 06/20/2017  . H/O ileostomy 06/01/2017  . Positive result for methicillin resistant Staphylococcus aureus (MRSA) screening 05/11/2017  . Ileostomy in place [facility]) 04/08/2017  . History of colostomy reversal 02/24/2017  . Other pulmonary embolism without acute cor pulmonale ([place]) 11/23/2016  . Acute on chronic systolic (congestive) heart failure ([place]) 11/18/2016  . HCAP (healthcare-associated pneumonia) 11/18/2016  . Encounter for removal of staples   . Edema of both legs 11/15/2016  . Morbidly obese ([place]) 11/15/2016  . Diverticulitis 10/03/2016  . Diverticulitis large intestine 09/29/2016  . Benign  essential hypertension 08/26/2016  . DDD (degenerative disc disease), lumbar 07/14/2016  . Acute non-recurrent maxillary sinusitis 05/21/2016  . Acute hip pain, left 02/23/2016  . Chronic low back pain without sciatica 11/14/2015  . Cough 07/30/2015  . Nocturnal leg cramps 02/20/2015  . Diabetes mellitus type 2 in obese ([place]) 11/12/2014  . Type 2 diabetes mellitus without complication, without long-term current use of insulin ([place]) 11/12/2014  . BP (high blood pressure) 11/12/2014    Past Surgical History:  Procedure Laterality Date  . APPENDECTOMY N/A 02/24/2017   Procedure: Incidental  APPENDECTOMY;  Surgeon: [name], MD;  Location: [name] ORS;  Service: General;  Laterality: N/A;  . CARPAL TUNNEL RELEASE Bilateral   . COLON RESECTION SIGMOID N/A 11/02/2016   Procedure: COLON RESECTION SIGMOID;  Surgeon: [name], MD;  Location: [name] ORS;  Service: General;  Laterality: N/A;  . COLON SURGERY  11/02/2016  . COLONOSCOPY WITH PROPOFOL N/A 02/03/2017   Procedure: COLONOSCOPY WITH PROPOFOL;  Surgeon: Lin Landsman, MD;  Location: [facility] ENDOSCOPY;  Service: Gastroenterology;  Laterality: N/A;  . COLOSTOMY N/A 11/02/2016   Procedure: COLOSTOMY;  Surgeon: [name], MD;  Location: [name] ORS;  Service: General;  Laterality: N/A;  . COLOSTOMY REVERSAL N/A 02/24/2017   Procedure: COLOSTOMY REVERSAL, ostomy takedown, spleenic flexure mobilization, excision rectal stump/distal sigmoid, anastomosis with suture reinforcement;  Surgeon: [name], MD;  Location: [name] ORS;  Service: General;  Laterality: N/A;  . ILEO LOOP DIVERSION N/A 02/24/2017   Procedure: ILEO LOOP COLOSTOMY;  Surgeon: [name], MD;  Location: [name] ORS;  Service: General;  Laterality: N/A;  . ILEOSTOMY CLOSURE N/A 06/01/2017   Procedure: LOOP ILEOSTOMY TAKEDOWN;  Surgeon: [name], MD;  Location: [name] ORS;  Service: General;  Laterality: N/A;  . IVC FILTER INSERTION Right 10/2016  . IVC  FILTER REMOVAL N/A 08/09/2017   Procedure: IVC FILTER REMOVAL;  Surgeon: Katha Cabal, MD;  Location: [place] CV LAB;  Service: Cardiovascular;  Laterality: N/A;  . KNEE SURGERY     torn menicus  . LAPAROSCOPY N/A 02/24/2017   Procedure: LAPAROSCOPY DIAGNOSTIC;  Surgeon: [name], MD;  Location: [name] ORS;  Service: General;  Laterality: N/A;  . LYSIS OF ADHESION N/A 02/24/2017   Procedure: LYSIS OF ADHESION;  Surgeon: [name], MD;  Location: [name] ORS;  Service: General;  Laterality: N/A;  . PULMONARY VENOGRAPHY N/A 11/19/2016   Procedure: Pulmonary Venography; IVC filter placement; possible pulmonary thrombectomy;  Surgeon: Katha Cabal, MD;  Location: [place] CV LAB;  Service: Cardiovascular;  Laterality: N/A;  . SIGMOIDOSCOPY N/A 11/13/2016   Procedure: endoscopic  flexible SIGMOIDOSCOPY;  Surgeon: Florene Glen, MD;  Location: [name] ORS;  Service: General;  Laterality: N/A;  . SIGMOIDOSCOPY N/A 05/19/2017   Procedure: FLEXIBLE SIGMOIDOSCOPY;  Surgeon: [name], MD;  Location: [name] ORS;  Service: General;  Laterality: N/A;    Prior to Admission medications   Medication Sig Start Date End Date Taking? Authorizing Provider  amoxicillin-clavulanate (AUGMENTIN) 875-125 MG tablet Take 1 tablet by mouth 2 (two) times daily. Patient not taking: Reported on 08/04/2017 06/17/17   Olean Ree, MD  aspirin EC 81 MG tablet Take 81 mg by mouth daily.    [provider]  candesartan (ATACAND) 16 MG tablet Take by mouth. 08/03/17 08/03/18  [provider]  cyclobenzaprine (FLEXERIL) 10 MG tablet Take 10 mg by mouth at bedtime as needed for muscle spasms.  07/05/16   [provider]  erythromycin base (E-MYCIN) 500 MG tablet Take 2 tablets (1,000 mg total) by mouth 3 (three) times daily. Patient not taking: Reported on 08/04/2017 05/25/17   [name], MD  glipiZIDE (GLUCOTROL XL) 5 MG 24 hr tablet Take 5 mg by mouth daily with  breakfast.  10/27/16   [provider]  losartan (COZAAR) 50 MG tablet Take 1 tablet by mouth daily. 11/02/16 11/02/17  [provider]  Multiple Vitamin (MULTI-VITAMINS) TABS Take 1 tablet by mouth daily.    [provider]  mupirocin ointment (BACTROBAN) 2 % Place 1 application into the nose 2 (two) times daily. Patient not taking: Reported on 08/04/2017 05/11/17   [name], MD  oxyCODONE-acetaminophen (PERCOCET) 7.5-325 MG tablet Take 1 tablet by mouth every 6 (six) hours as needed for severe pain. Patient not taking: Reported on 08/04/2017 05/25/17   [name], MD  predniSONE (DELTASONE) 50 MG tablet Take one 50 mg tablet once daily for the next 5 days. 09/26/17   Lannie Fields, PA-C  traMADol (ULTRAM) 50 MG tablet Take 1 tablet (50 mg total) by mouth every 6 (six) hours as needed for up to 3 days. 09/26/17 09/29/17  Lannie Fields, PA-C    Allergies Adhesive [tape]; Morphine; Other; Latex; and Morphine and related  Family History  Problem Relation Age of Onset  . Diabetes Mother   . Heart disease Mother   . Cancer Mother   . Breast cancer Mother   . Heart disease Father   . Diabetes Sister   . Heart disease Sister   . Breast cancer Cousin  Social History Social History   Tobacco Use  . Smoking status: Never Smoker  . Smokeless tobacco: Never Used  Substance Use Topics  . Alcohol use: No    Alcohol/week: 0.0 oz  . Drug use: No     Review of Systems  Constitutional: No fever/chills Eyes: No visual changes. No discharge ENT: No upper respiratory complaints. Cardiovascular: no chest pain. Respiratory: no cough. No SOB. Gastrointestinal: No abdominal pain.  No nausea, no vomiting.  No diarrhea.  No constipation. Musculoskeletal: Patient has low back pain. Skin: Negative for rash, abrasions, lacerations, ecchymosis. Neurological: Negative for headaches, focal weakness or  numbness.   ____________________________________________   PHYSICAL EXAM:  VITAL SIGNS: ED Triage Vitals [09/26/17 1700]  Enc Vitals Group     BP 130/78     Pulse Rate 86     Resp 18     Temp 98.1 F (36.7 C)     Temp Source Oral     SpO2 97 %     Weight      Height      Head Circumference      Peak Flow      Pain Score      Pain Loc      Pain Edu?      Excl. in [place]?      Constitutional: Alert and oriented. Well appearing and in no acute distress. Eyes: Conjunctivae are normal. PERRL. EOMI. Head: Atraumatic. Cardiovascular: Normal rate, regular rhythm. Normal S1 and S2.  Good peripheral circulation. Respiratory: Normal respiratory effort without tachypnea or retractions. Lungs CTAB. Good air entry to the bases with no decreased or absent breath sounds. Musculoskeletal: Full range of motion to all extremities. No gross deformities appreciated.  No paraspinal muscle tenderness along the lumbar spine.  Negative straight leg raise bilaterally. Neurologic:  Normal speech and language. No gross focal neurologic deficits are appreciated.  Skin:  Skin is warm, dry and intact. No rash noted.  ____________________________________________   LABS (all labs ordered are listed, but only abnormal results are displayed)  Labs Reviewed - No data to display ____________________________________________  EKG   ____________________________________________  RADIOLOGY Unk Pinto, personally viewed and evaluated these images (plain radiographs) as part of my medical decision making, as well as reviewing the written report by the radiologist.  Dg Hip Unilat W Or Wo Pelvis 2-3 Views Right  Result Date: 09/26/2017 CLINICAL DATA:  Injury EXAM: DG HIP (WITH OR WITHOUT PELVIS) 2-3V RIGHT COMPARISON:  None. FINDINGS: No acute fracture. No dislocation. Osteopenia. Mild narrowing of the right hip joint space which juxta-articular sclerosis. IMPRESSION: No acute bony pathology.  Electronically Signed   By: Marybelle Killings M.D.   On: 09/26/2017 17:48    ____________________________________________    PROCEDURES  Procedure(s) performed:    Procedures    Medications  methylPREDNISolone sodium succinate (SOLU-MEDROL) 125 mg/2 mL injection 125 mg (125 mg Intramuscular Given 09/26/17 1841)     ____________________________________________   INITIAL IMPRESSION / ASSESSMENT AND PLAN / ED COURSE  Pertinent labs & imaging results that were available during my care of the patient were reviewed by me and considered in my medical decision making (see chart for details).  Review of the [place] CSRS was performed in accordance of the [place] prior to dispensing any controlled drugs.     Assessment and plan Lumbar strain Differential diagnosis originally included right hip osteoarthritis, lumbar strain and muscle spasm.  History and physical exam findings are consistent with lumbar strain at this  time.  Patient was given an injection of Solu-Medrol in the emergency department.  She was discharged with prednisone and a short course of tramadol.  She was advised to follow-up with primary care as needed.  Vital signs are reassuring prior to discharge.   ____________________________________________  FINAL CLINICAL IMPRESSION(S) / ED DIAGNOSES  Final diagnoses:  Strain of lumbar region, initial encounter      NEW MEDICATIONS STARTED DURING THIS VISIT:  ED Discharge Orders        Ordered    predniSONE (DELTASONE) 50 MG tablet     09/26/17 1831    traMADol (ULTRAM) 50 MG tablet  Every 6 hours PRN     09/26/17 1831          This chart was dictated using voice recognition software/Dragon. Despite best efforts to proofread, errors can occur which can change the meaning. Any change was purely unintentional.    Lannie Fields, PA-C 09/26/17 1850    Nance Pear, MD 09/26/17 1946

## 2017-09-26 NOTE — ED Triage Notes (Addendum)
States she was pushing against her heavy freezer and felt pop to hip  Denies any fall

## 2017-09-29 ENCOUNTER — Other Ambulatory Visit: Payer: Self-pay

## 2017-09-29 NOTE — Patient Outreach (Addendum)
[facility]) Care Management  09/29/2017  Kara [name] Sep 09, 1950 [id number]   Telephone Screen  Referral Date: 09/28/17 Referral Source:[facility] Dept-Urgent Referral Reason: " CM referral for [facility] aide, member has lumbar strain to hip, states she is having difficulty getting around, member is using a walker from previous injury, husband has to buy some briefs because she has been unable to make it to the bathroom in time" Insurance: [employer]   Outreach attempt # 1 to patient. Spoke with patient and discussed referral. Patient states that she has already contacted her PCP to advise them that she needs personal care assistance in the home temporaily. She states that her spouse is available to assist her at times but still works.Patient is adamant that she does not and will not pay out of pocket for these services. She only wants services that her insurance will pay. Spoke with patient regarding [facility] services. [facility] does not provide hands on personal care to patients. Patient voices that she does not feel like she needs services that [facility] does provide right now. She reports that she is using a heating pad and taking pain meds prn to help manage strain pain. She is aware to follow up with MD for any worsening and/or unresolved issues. She voices that she is managing her meds and her conditions and does not need assistance with those areas. She is hopeful that MD office will call her back soon to discuss [facility] services. She denies any further needs or concerns at this time.       Plan: RN CM will close case at this time.    Enzo Montgomery, RN,BSN,CCM [place] [facility] Telephonic Care Management Coordinator Direct Phone: [phone] Toll Free: [phone] Fax: [phone]

## 2017-10-02 ENCOUNTER — Other Ambulatory Visit: Payer: Self-pay

## 2017-10-02 ENCOUNTER — Emergency Department
Admission: EM | Admit: 2017-10-02 | Discharge: 2017-10-02 | Disposition: A | Discharge status: Home / Self Care | Payer: PPO | Attending: Student in an Organized Health Care Education/Training Program | Admitting: Student in an Organized Health Care Education/Training Program

## 2017-10-02 ENCOUNTER — Emergency Department: Payer: PPO

## 2017-10-02 ENCOUNTER — Encounter: Payer: Self-pay | Admitting: Emergency Medicine

## 2017-10-02 DIAGNOSIS — I5023 Acute on chronic systolic (congestive) heart failure: Secondary | ICD-10-CM | POA: Diagnosis not present

## 2017-10-02 DIAGNOSIS — M5441 Lumbago with sciatica, right side: Secondary | ICD-10-CM | POA: Diagnosis not present

## 2017-10-02 DIAGNOSIS — Z79899 Other long term (current) drug therapy: Secondary | ICD-10-CM | POA: Insufficient documentation

## 2017-10-02 DIAGNOSIS — E119 Type 2 diabetes mellitus without complications: Secondary | ICD-10-CM | POA: Insufficient documentation

## 2017-10-02 DIAGNOSIS — Z7982 Long term (current) use of aspirin: Secondary | ICD-10-CM | POA: Insufficient documentation

## 2017-10-02 DIAGNOSIS — M549 Dorsalgia, unspecified: Secondary | ICD-10-CM | POA: Diagnosis not present

## 2017-10-02 DIAGNOSIS — I110 Hypertensive heart disease with heart failure: Secondary | ICD-10-CM | POA: Insufficient documentation

## 2017-10-02 DIAGNOSIS — Z91040 Latex allergy status: Secondary | ICD-10-CM | POA: Insufficient documentation

## 2017-10-02 DIAGNOSIS — M545 Low back pain: Secondary | ICD-10-CM | POA: Diagnosis not present

## 2017-10-02 MED ORDER — NAPROXEN 500 MG PO TABS: 500.0000 mg | ORAL_TABLET | Freq: Two times a day (BID) | ORAL | 0 refills | Status: DC

## 2017-10-02 MED ORDER — LIDOCAINE 5 % EX PTCH
1.0000 | MEDICATED_PATCH | CUTANEOUS | Status: DC
  Administered 2017-10-02: 1 via TRANSDERMAL
  Filled 2017-10-02: qty 1

## 2017-10-02 MED ORDER — LIDOCAINE 5 % EX PTCH
MEDICATED_PATCH | CUTANEOUS | Status: AC
  Administered 2017-10-02: 1 via TRANSDERMAL
  Filled 2017-10-02: qty 1

## 2017-10-02 MED ORDER — BUPIVACAINE HCL (PF) 0.5 % IJ SOLN
INTRAMUSCULAR | Status: AC
  Administered 2017-10-02: 30 mL
  Filled 2017-10-02: qty 30

## 2017-10-02 MED ORDER — HYDROCODONE-ACETAMINOPHEN 5-325 MG PO TABS: 1.0000 | ORAL_TABLET | ORAL | 0 refills | Status: DC | PRN

## 2017-10-02 MED ORDER — BUPIVACAINE HCL (PF) 0.5 % IJ SOLN
30.0000 mL | Freq: Once | INTRAMUSCULAR | Status: AC
  Administered 2017-10-02: 30 mL

## 2017-10-02 MED ORDER — HYDROCODONE-ACETAMINOPHEN 5-325 MG PO TABS
1.0000 | ORAL_TABLET | Freq: Once | ORAL | Status: AC
  Administered 2017-10-02: 1 via ORAL
  Filled 2017-10-02: qty 1

## 2017-10-02 NOTE — Discharge Instructions (Addendum)
You have been seen and diagnosed with back pain today in the Emergency Department. ° °Medicines: Please take the medicine as prescribed. Call your primary healthcare provider if you think your medicine is not working as expected or if you feel you need more. I do not generally provide refills on pain medications in the ED because I don’t monitor you on a long-term basis. Your primary care doctor does. Do not drive or operate heavy machinery while taking narcotic pain medications. ° °Self-care: °Exercise: Gentle exercise may help decrease your pain. Start with some light exercises such as walking, biking, or swimming during the first 2 weeks. Ask for more information about the activities or exercises that are right for you. °Maintain a healthy weight. ° °Ice: Ice helps decrease swelling, pain, and muscle spasm. Put crushed ice in a plastic bag. Cover it with a towel. Place it on your lower back for 20 to 30 minutes every 2 hours until improvement. ° °Heat: Heat helps decrease pain and muscle spasms. Use a small towel dampened with warm water or a heat pad, or sit in a warm bath. Apply heat on the area for 20 to 30 minutes every 2 hours until improvement. Alternate heat and ice. ° °Physical therapy: You may need to see a physical therapist to teach you special exercises. These exercises help improve movement and decrease pain. Physical therapy can also help improve strength and decrease your risk for loss of function. Please follow-up with your PCP about physical therapy. ° °Contact your primary healthcare provider or orthopedist if: ° You have a fever. ° You have pain at night or when you rest. ° Your pain does not get better with treatment. ° You have pain that worsens when you cough, sneeze, or strain your back. ° You suddenly feel something pop or snap in your back. ° You have questions or concerns about your condition or care. ° °Please return to the emergency department if: ° You have severe pain. ° You have  sudden stiffness or heaviness to buttocks down to both legs. ° You have numbness or weakness in one leg, or pain in both legs. ° You have numbness in your genital area or across your lower back. ° You cannot control your urine or bowel movements. ° °

## 2017-10-02 NOTE — ED Notes (Signed)
Patient ambulatory with walker to restroom with steady gait.  Patient states has "rollator" (walker with seat) at home available.

## 2017-10-02 NOTE — ED Triage Notes (Signed)
Pt in via EMS with c/o back pain. EMS reports per pt she was moving a refrigerator on May 6th and hurt her back. Pt was seen and dx'd with a lumbar strain. EMS reports per pt the pain is not getting better. She was rx'd tramadol and prednisone. She completed her prednisone yesterday. EMS reports VS WNL.

## 2017-10-02 NOTE — ED Notes (Signed)
Patient transported to X-ray 

## 2017-10-02 NOTE — ED Provider Notes (Signed)
[facility] Emergency Department Provider Note    First MD Initiated Contact with Patient 10/02/17 1433     (approximate)  I have reviewed the triage vital signs and the nursing notes.   HISTORY  Chief Complaint Back Pain    HPI Kara [name] is a 67 y.o. female with a history of arthritis presents to the ER roughly 1 week status post straining her back and being seen in the ER for similar symptoms.  Finished a prednisone pack yesterday and has had persistent pain.  Denies any numbness or tingling but does feel the pain shooting down the back of her right leg.  She is able to ambulate but it is limited due to pain.  No fevers.  Denies any loss of bladder or bowel control.  No saddle anesthesia.  No interval trauma.  States the pain does feel more proximal than her hip.  Past Medical History:  Diagnosis Date  . Anginal pain ([place])   . 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 CHF (congestive heart failure) ([place])   . Diabetes ([place])   . Diverticulitis   . Hypertension   . PE (pulmonary thromboembolism) ([place])   . 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[facility])    Family History  Problem Relation Age of Onset  . Diabetes Mother   . Heart disease Mother   . Cancer Mother   . Breast cancer Mother   . Heart disease Father   . Diabetes Sister   . Heart disease Sister   . Breast cancer Cousin    Past Surgical History:  Procedure Laterality Date  . APPENDECTOMY N/A 02/24/2017   Procedure: Incidental  APPENDECTOMY;  Surgeon: [name], MD;  Location: [name] ORS;  Service: General;  Laterality: N/A;  . CARPAL TUNNEL RELEASE Bilateral   . COLON RESECTION SIGMOID N/A 11/02/2016   Procedure: COLON RESECTION SIGMOID;  Surgeon: [name], MD;  Location: [name] ORS;  Service: General;  Laterality: N/A;  . COLON SURGERY  11/02/2016  . COLONOSCOPY WITH PROPOFOL N/A 02/03/2017   Procedure: COLONOSCOPY WITH PROPOFOL;   Surgeon: Lin Landsman, MD;  Location: [facility] ENDOSCOPY;  Service: Gastroenterology;  Laterality: N/A;  . COLOSTOMY N/A 11/02/2016   Procedure: COLOSTOMY;  Surgeon: [name], MD;  Location: [name] ORS;  Service: General;  Laterality: N/A;  . COLOSTOMY REVERSAL N/A 02/24/2017   Procedure: COLOSTOMY REVERSAL, ostomy takedown, spleenic flexure mobilization, excision rectal stump/distal sigmoid, anastomosis with suture reinforcement;  Surgeon: [name], MD;  Location: [name] ORS;  Service: General;  Laterality: N/A;  . ILEO LOOP DIVERSION N/A 02/24/2017   Procedure: ILEO LOOP COLOSTOMY;  Surgeon: [name], MD;  Location: [name] ORS;  Service: General;  Laterality: N/A;  . ILEOSTOMY CLOSURE N/A 06/01/2017   Procedure: LOOP ILEOSTOMY TAKEDOWN;  Surgeon: [name], MD;  Location: [name] ORS;  Service: General;  Laterality: N/A;  . IVC FILTER INSERTION Right 10/2016  . IVC FILTER REMOVAL N/A 08/09/2017   Procedure: IVC FILTER REMOVAL;  Surgeon: Katha Cabal, MD;  Location: [place] CV LAB;  Service: Cardiovascular;  Laterality: N/A;  . KNEE SURGERY     torn menicus  . LAPAROSCOPY N/A 02/24/2017   Procedure: LAPAROSCOPY DIAGNOSTIC;  Surgeon: [name], MD;  Location: [name] ORS;  Service: General;  Laterality: N/A;  . LYSIS OF ADHESION N/A 02/24/2017   Procedure: LYSIS OF ADHESION;  Surgeon: [name], MD;  Location: [name] ORS;  Service: General;  Laterality: N/A;  . PULMONARY VENOGRAPHY N/A 11/19/2016   Procedure: Pulmonary Venography; IVC filter placement; possible pulmonary thrombectomy;  Surgeon: Katha Cabal, MD;  Location: [place] CV LAB;  Service: Cardiovascular;  Laterality: N/A;  . SIGMOIDOSCOPY N/A 11/13/2016   Procedure: endoscopic  flexible SIGMOIDOSCOPY;  Surgeon: Florene Glen, MD;  Location: [name] ORS;  Service: General;  Laterality: N/A;  . SIGMOIDOSCOPY N/A 05/19/2017   Procedure: Beryle Quant;  Surgeon: [name],  MD;  Location: [name] ORS;  Service: General;  Laterality: N/A;   Patient Active Problem List   Diagnosis Date Noted  . Diverticulosis of colon 06/20/2017  . History of pulmonary embolism 06/20/2017  . S/P IVC filter 06/20/2017  . H/O ileostomy 06/01/2017  . Positive result for methicillin resistant Staphylococcus aureus (MRSA) screening 05/11/2017  . Ileostomy in place [facility]) 04/08/2017  . History of colostomy reversal 02/24/2017  . Other pulmonary embolism without acute cor pulmonale ([place]) 11/23/2016  . Acute on chronic systolic (congestive) heart failure ([place]) 11/18/2016  . HCAP (healthcare-associated pneumonia) 11/18/2016  . Encounter for removal of staples   . Edema of both legs 11/15/2016  . Morbidly obese ([place]) 11/15/2016  . Diverticulitis 10/03/2016  . Diverticulitis large intestine 09/29/2016  . Benign essential hypertension 08/26/2016  . DDD (degenerative disc disease), lumbar 07/14/2016  . Acute non-recurrent maxillary sinusitis 05/21/2016  . Acute hip pain, left 02/23/2016  . Chronic low back pain without sciatica 11/14/2015  . Cough 07/30/2015  . Nocturnal leg cramps 02/20/2015  . Diabetes mellitus type 2 in obese ([place]) 11/12/2014  . Type 2 diabetes mellitus without complication, without long-term current use of insulin ([place]) 11/12/2014  . BP (high blood pressure) 11/12/2014      Prior to Admission medications   Medication Sig Start Date End Date Taking? Authorizing Provider  amoxicillin-clavulanate (AUGMENTIN) 875-125 MG tablet Take 1 tablet by mouth 2 (two) times daily. Patient not taking: Reported on 08/04/2017 06/17/17   Olean Ree, MD  aspirin EC 81 MG tablet Take 81 mg by mouth daily.    [provider]  candesartan (ATACAND) 16 MG tablet Take by mouth. 08/03/17 08/03/18  [provider]  cyclobenzaprine (FLEXERIL) 10 MG tablet Take 10 mg by mouth at bedtime as needed for muscle spasms.  07/05/16   [provider]  erythromycin base  (E-MYCIN) 500 MG tablet Take 2 tablets (1,000 mg total) by mouth 3 (three) times daily. Patient not taking: Reported on 08/04/2017 05/25/17   [name], MD  glipiZIDE (GLUCOTROL XL) 5 MG 24 hr tablet Take 5 mg by mouth daily with breakfast.  10/27/16   [provider]  losartan (COZAAR) 50 MG tablet Take 1 tablet by mouth daily. 11/02/16 11/02/17  [provider]  Multiple Vitamin (MULTI-VITAMINS) TABS Take 1 tablet by mouth daily.    [provider]  mupirocin ointment (BACTROBAN) 2 % Place 1 application into the nose 2 (two) times daily. Patient not taking: Reported on 08/04/2017 05/11/17   [name], MD  oxyCODONE-acetaminophen (PERCOCET) 7.5-325 MG tablet Take 1 tablet by mouth every 6 (six) hours as needed for severe pain. Patient not taking: Reported on 08/04/2017 05/25/17   [name], MD  predniSONE (DELTASONE) 50 MG tablet Take one 50 mg tablet once daily for the next 5 days. 09/26/17   Lannie Fields, PA-C    Allergies Adhesive [tape]; Morphine; Other; Latex; and Morphine and related    Social History Social History   Tobacco Use  . Smoking status:  Never Smoker  . Smokeless tobacco: Never Used  Substance Use Topics  . Alcohol use: No    Alcohol/week: 0.0 oz  . Drug use: No    Review of Systems Patient denies headaches, rhinorrhea, blurry vision, numbness, shortness of breath, chest pain, edema, cough, abdominal pain, nausea, vomiting, diarrhea, dysuria, fevers, rashes or hallucinations unless otherwise stated above in HPI. ____________________________________________   PHYSICAL EXAM:  VITAL SIGNS: Vitals:   10/02/17 1419 10/02/17 1420  BP:  107/66  Pulse:  94  Resp: 18 18  Temp: 98.2 F (36.8 C)   SpO2:  98%    Constitutional: Alert and oriented. Well appearing and in no acute distress. Eyes: Conjunctivae are normal.  Head: Atraumatic. Nose: No congestion/rhinnorhea. Mouth/Throat: Mucous membranes are moist.   Neck:  Painless ROM.  Cardiovascular:   Good peripheral circulation. Respiratory: Normal respiratory effort.  No retractions.  Gastrointestinal: Soft and nontender.  Musculoskeletal: pain with right straight leg raise,  DTRs equal and present BLE,  Down going babinski's bilaterally,  No lower extremity tenderness .  No joint effusions. Neurologic:  Normal speech and language. No gross focal neurologic deficits are appreciated.  Skin:  Skin is warm, dry and intact. No rash noted. Psychiatric: Mood and affect are normal. Speech and behavior are normal.  ____________________________________________   LABS (all labs ordered are listed, but only abnormal results are displayed)  No results found for this or any previous visit (from the past 24 hour(s)). ____________________________________________  EKG____________________________________________  RADIOLOGY  I personally reviewed all radiographic images ordered to evaluate for the above acute complaints and reviewed radiology reports and findings.  These findings were personally discussed with the patient.  Please see medical record for radiology report.  ____________________________________________   PROCEDURES  Procedure(s) performed:  Procedures Procedure: Trigger point injection. The patient agreed to the procedure without reservation, and acknowledging the risks of infection, nerve damage, damage to internal organs, lack of efficacy, bleeding. The right low back was prepped with chloraprep and allowed to dry. A 10 cc syringe was loaded with 0.5% Bupivacaine and a 27 ga needle advanced toward the areas of discomfort The plunger was withdrawn before injection. The patient tolerated the procedure well.    Critical Care performed: no ____________________________________________   INITIAL IMPRESSION / ASSESSMENT AND PLAN / ED COURSE  Pertinent labs & imaging results that were available during my care of the patient were reviewed by me and  considered in my medical decision making (see chart for details).  DDX: lumbago, sciatica, radiculopathy, fracture, contusion  Kara [name] is a 67 y.o. who presents to the ED with No history of injury or trauma. No recent back instrumentation/procedures. No fevers. Denies cord compression symptoms. No bowel/bladder incontinence or retention, no LE weakness. VSS in ED. Exam with no LE weakness bilat., no sensory deficits, normal DTRs, no clonus, no saddle anesthesia. Pain with palpation of back and with trunk movement. Likely MSK related pain, probable lumbar strain. History and physical exam less consistent with kidney stone or pyelonephritis. XR without evidence of interval compression fracture or worsening spondylisthesis.  Treatments will include observation, analgesia, and arrange appropriate follow up for recheck.  Clinical picture is not consistent with epidural abscess , fracture, or cauda equina syndrome. Plan supportive care, follow up for recheck       ____________________________________________   FINAL CLINICAL IMPRESSION(S) / ED DIAGNOSES  Final diagnoses:  Acute right-sided low back pain with right-sided sciatica      NEW MEDICATIONS STARTED DURING THIS  VISIT:  New Prescriptions   No medications on file     Note:  This document was prepared using Dragon voice recognition software and may include unintentional dictation errors.     Merlyn Lot, MD 10/02/17 [phone]

## 2017-10-03 ENCOUNTER — Emergency Department: Payer: PPO

## 2017-10-03 ENCOUNTER — Encounter: Payer: Self-pay | Admitting: Radiology

## 2017-10-03 ENCOUNTER — Emergency Department
Admission: EM | Admit: 2017-10-03 | Discharge: 2017-10-04 | Disposition: A | Discharge status: Home / Self Care | Payer: PPO | Attending: Emergency Medicine | Admitting: Emergency Medicine

## 2017-10-03 DIAGNOSIS — I509 Heart failure, unspecified: Secondary | ICD-10-CM | POA: Diagnosis not present

## 2017-10-03 DIAGNOSIS — R58 Hemorrhage, not elsewhere classified: Secondary | ICD-10-CM | POA: Diagnosis not present

## 2017-10-03 DIAGNOSIS — L7622 Postprocedural hemorrhage and hematoma of skin and subcutaneous tissue following other procedure: Secondary | ICD-10-CM | POA: Diagnosis present

## 2017-10-03 DIAGNOSIS — Z7984 Long term (current) use of oral hypoglycemic drugs: Secondary | ICD-10-CM | POA: Insufficient documentation

## 2017-10-03 DIAGNOSIS — Z7982 Long term (current) use of aspirin: Secondary | ICD-10-CM | POA: Diagnosis not present

## 2017-10-03 DIAGNOSIS — I110 Hypertensive heart disease with heart failure: Secondary | ICD-10-CM | POA: Diagnosis not present

## 2017-10-03 DIAGNOSIS — Y829 Unspecified medical devices associated with adverse incidents: Secondary | ICD-10-CM | POA: Diagnosis not present

## 2017-10-03 DIAGNOSIS — Z79899 Other long term (current) drug therapy: Secondary | ICD-10-CM | POA: Diagnosis not present

## 2017-10-03 DIAGNOSIS — Z91040 Latex allergy status: Secondary | ICD-10-CM | POA: Diagnosis not present

## 2017-10-03 DIAGNOSIS — E119 Type 2 diabetes mellitus without complications: Secondary | ICD-10-CM | POA: Insufficient documentation

## 2017-10-03 DIAGNOSIS — T8131XA Disruption of external operation (surgical) wound, not elsewhere classified, initial encounter: Secondary | ICD-10-CM | POA: Diagnosis not present

## 2017-10-03 DIAGNOSIS — T8130XA Disruption of wound, unspecified, initial encounter: Secondary | ICD-10-CM

## 2017-10-03 DIAGNOSIS — K439 Ventral hernia without obstruction or gangrene: Secondary | ICD-10-CM | POA: Diagnosis not present

## 2017-10-03 DIAGNOSIS — S31109A Unspecified open wound of abdominal wall, unspecified quadrant without penetration into peritoneal cavity, initial encounter: Secondary | ICD-10-CM | POA: Diagnosis not present

## 2017-10-03 DIAGNOSIS — M545 Low back pain: Secondary | ICD-10-CM | POA: Diagnosis not present

## 2017-10-03 DIAGNOSIS — M25551 Pain in right hip: Secondary | ICD-10-CM | POA: Diagnosis not present

## 2017-10-03 LAB — BASIC METABOLIC PANEL
ANION GAP: 8 (ref 5–15)
BUN: 19 mg/dL (ref 6–20)
CALCIUM: 8.3 mg/dL — AB (ref 8.9–10.3)
CO2: 22 mmol/L (ref 22–32)
CREATININE: 0.63 mg/dL (ref 0.44–1.00)
Chloride: 104 mmol/L (ref 101–111)
Glucose, Bld: 218 mg/dL — ABNORMAL HIGH (ref 65–99)
Potassium: 4 mmol/L (ref 3.5–5.1)
Sodium: 134 mmol/L — ABNORMAL LOW (ref 135–145)

## 2017-10-03 LAB — CBC
HEMATOCRIT: 49.7 % — AB (ref 35.0–47.0)
HEMOGLOBIN: 16.5 g/dL — AB (ref 12.0–16.0)
MCH: 30.1 pg (ref 26.0–34.0)
MCHC: 33.2 g/dL (ref 32.0–36.0)
MCV: 90.7 fL (ref 80.0–100.0)
PLATELETS: 200 10*3/uL (ref 150–440)
RBC: 5.48 MIL/uL — AB (ref 3.80–5.20)
RDW: 14.2 % (ref 11.5–14.5)
WBC: 9.9 10*3/uL (ref 3.6–11.0)

## 2017-10-03 MED ORDER — SODIUM CHLORIDE 0.9 % IV SOLN
2000.0000 mg | Freq: Once | INTRAVENOUS | Status: AC
  Administered 2017-10-03: 2000 mg via INTRAVENOUS
  Filled 2017-10-03: qty 2000

## 2017-10-03 MED ORDER — CIPROFLOXACIN HCL 500 MG PO TABS: 500.0000 mg | ORAL_TABLET | Freq: Two times a day (BID) | ORAL | 0 refills | Status: AC

## 2017-10-03 MED ORDER — METRONIDAZOLE 500 MG PO TABS: 500.0000 mg | ORAL_TABLET | Freq: Three times a day (TID) | ORAL | 0 refills | Status: AC

## 2017-10-03 MED ORDER — IOPAMIDOL (ISOVUE-300) INJECTION 61%
30.0000 mL | Freq: Once | INTRAVENOUS | Status: AC
  Administered 2017-10-03: 30 mL via ORAL

## 2017-10-03 MED ORDER — SODIUM CHLORIDE 0.9 % IV BOLUS
1000.0000 mL | Freq: Once | INTRAVENOUS | Status: AC
  Administered 2017-10-03: 1000 mL via INTRAVENOUS

## 2017-10-03 MED ORDER — PIPERACILLIN-TAZOBACTAM 3.375 G IVPB 30 MIN
3.3750 g | Freq: Once | INTRAVENOUS | Status: AC
  Administered 2017-10-03: 3.375 g via INTRAVENOUS
  Filled 2017-10-03: qty 50

## 2017-10-03 MED ORDER — CLINDAMYCIN HCL 300 MG PO CAPS: 300.0000 mg | ORAL_CAPSULE | Freq: Four times a day (QID) | ORAL | 0 refills | Status: DC

## 2017-10-03 MED ORDER — IOPAMIDOL (ISOVUE-370) INJECTION 76%
75.0000 mL | Freq: Once | INTRAVENOUS | Status: AC | PRN
  Administered 2017-10-03: 75 mL via INTRAVENOUS

## 2017-10-03 NOTE — Discharge Instructions (Addendum)
Please take the entire course of antibiotics, even if you are feeling better.  Return to the emergency department if you develop severe pain, fever, bleeding, vomiting, or any other symptoms concerning to you.

## 2017-10-03 NOTE — ED Triage Notes (Signed)
Pt to ED reporting her abd wound has reopened after a colostomy repair 4 months ago. Pt reports the wound has been taking a long time to heal and today while she was in the shower the wound opened and drained a clear cream like fluid before starting to bleed. Wound continues to bleed at this time. No fevers and no abd pain reported.

## 2017-10-03 NOTE — ED Notes (Signed)
Patient transported to CT 

## 2017-10-03 NOTE — ED Provider Notes (Signed)
[facility] Emergency Department Provider Note  ____________________________________________  Time seen: Approximately 7:36 PM  I have reviewed the triage vital signs and the nursing notes.   HISTORY  Chief Complaint Wound Dehiscence    HPI Kara [name] is a 67 y.o. female status post sigmoid colon resection with colostomy 6/18, loop ileostomy takedown 06/01/2017, presenting with wound dehiscence, bleeding and purulent discharge.  The patient reports that she noted a "bumpy" area in the lower part of her pannus over her surgical incision site.  Her husband ran his fingers over it and it "popped open" resulting in bleeding and purulent discharge.  The patient denies any recent illness including fever or chills, nausea or vomiting.  He was evaluated here yesterday for back and hip pain, which has improved with symptomatic treatment.  Past Medical History:  Diagnosis Date  . Anginal pain ([place])   . 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 CHF (congestive heart failure) ([place])   . Diabetes ([place])   . Diverticulitis   . Hypertension   . PE (pulmonary thromboembolism) ([place])   . 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[facility])     Patient Active Problem List   Diagnosis Date Noted  . Diverticulosis of colon 06/20/2017  . History of pulmonary embolism 06/20/2017  . S/P IVC filter 06/20/2017  . H/O ileostomy 06/01/2017  . Positive result for methicillin resistant Staphylococcus aureus (MRSA) screening 05/11/2017  . Ileostomy in place [facility]) 04/08/2017  . History of colostomy reversal 02/24/2017  . Other pulmonary embolism without acute cor pulmonale ([place]) 11/23/2016  . Acute on chronic systolic (congestive) heart failure ([place]) 11/18/2016  . HCAP (healthcare-associated pneumonia) 11/18/2016  . Encounter for removal of staples   . Edema of both legs 11/15/2016  . Morbidly obese ([place]) 11/15/2016  . Diverticulitis 10/03/2016  .  Diverticulitis large intestine 09/29/2016  . Benign essential hypertension 08/26/2016  . DDD (degenerative disc disease), lumbar 07/14/2016  . Acute non-recurrent maxillary sinusitis 05/21/2016  . Acute hip pain, left 02/23/2016  . Chronic low back pain without sciatica 11/14/2015  . Cough 07/30/2015  . Nocturnal leg cramps 02/20/2015  . Diabetes mellitus type 2 in obese ([place]) 11/12/2014  . Type 2 diabetes mellitus without complication, without long-term current use of insulin ([place]) 11/12/2014  . BP (high blood pressure) 11/12/2014    Past Surgical History:  Procedure Laterality Date  . APPENDECTOMY N/A 02/24/2017   Procedure: Incidental  APPENDECTOMY;  Surgeon: [name], MD;  Location: [name] ORS;  Service: General;  Laterality: N/A;  . CARPAL TUNNEL RELEASE Bilateral   . COLON RESECTION SIGMOID N/A 11/02/2016   Procedure: COLON RESECTION SIGMOID;  Surgeon: [name], MD;  Location: [name] ORS;  Service: General;  Laterality: N/A;  . COLON SURGERY  11/02/2016  . COLONOSCOPY WITH PROPOFOL N/A 02/03/2017   Procedure: COLONOSCOPY WITH PROPOFOL;  Surgeon: Lin Landsman, MD;  Location: [facility] ENDOSCOPY;  Service: Gastroenterology;  Laterality: N/A;  . COLOSTOMY N/A 11/02/2016   Procedure: COLOSTOMY;  Surgeon: [name], MD;  Location: [name] ORS;  Service: General;  Laterality: N/A;  . COLOSTOMY REVERSAL N/A 02/24/2017   Procedure: COLOSTOMY REVERSAL, ostomy takedown, spleenic flexure mobilization, excision rectal stump/distal sigmoid, anastomosis with suture reinforcement;  Surgeon: [name], MD;  Location: [name] ORS;  Service: General;  Laterality: N/A;  . ILEO LOOP DIVERSION N/A 02/24/2017   Procedure: ILEO LOOP COLOSTOMY;  Surgeon: [name], MD;  Location: [name] ORS;  Service: General;  Laterality: N/A;  . ILEOSTOMY CLOSURE N/A 06/01/2017   Procedure: LOOP ILEOSTOMY TAKEDOWN;  Surgeon: [name], MD;  Location: [name] ORS;  Service: General;  Laterality:  N/A;  . IVC FILTER INSERTION Right 10/2016  . IVC FILTER REMOVAL N/A 08/09/2017   Procedure: IVC FILTER REMOVAL;  Surgeon: Katha Cabal, MD;  Location: [place] CV LAB;  Service: Cardiovascular;  Laterality: N/A;  . KNEE SURGERY     torn menicus  . LAPAROSCOPY N/A 02/24/2017   Procedure: LAPAROSCOPY DIAGNOSTIC;  Surgeon: [name], MD;  Location: [name] ORS;  Service: General;  Laterality: N/A;  . LYSIS OF ADHESION N/A 02/24/2017   Procedure: LYSIS OF ADHESION;  Surgeon: [name], MD;  Location: [name] ORS;  Service: General;  Laterality: N/A;  . PULMONARY VENOGRAPHY N/A 11/19/2016   Procedure: Pulmonary Venography; IVC filter placement; possible pulmonary thrombectomy;  Surgeon: Katha Cabal, MD;  Location: [place] CV LAB;  Service: Cardiovascular;  Laterality: N/A;  . SIGMOIDOSCOPY N/A 11/13/2016   Procedure: endoscopic  flexible SIGMOIDOSCOPY;  Surgeon: Florene Glen, MD;  Location: [name] ORS;  Service: General;  Laterality: N/A;  . SIGMOIDOSCOPY N/A 05/19/2017   Procedure: Beryle Quant;  Surgeon: [name], MD;  Location: [name] ORS;  Service: General;  Laterality: N/A;    Current Outpatient Rx  . Order #: [id number] Class: Normal  . Order #: [id number] Class: Historical Med  . Order #: [id number] Class: Historical Med  . Order #: [id number] Class: Historical Med  . Order #: [id number] Class: Normal  . Order #: [id number] Class: Historical Med  . Order #: [id number] Class: Print  . Order #: [id number] Class: Historical Med  . Order #: [id number] Class: Historical Med  . Order #: [id number] Class: Normal  . Order #: [id number] Class: Print  . Order #: [id number] Class: Print  . Order #: [id number] Class: Print    Allergies Adhesive [tape]; Morphine; Other; Latex; and Morphine and related  Family History  Problem Relation Age of Onset  . Diabetes Mother   . Heart disease Mother   . Cancer Mother   . Breast cancer Mother   . Heart disease Father   .  Diabetes Sister   . Heart disease Sister   . Breast cancer Cousin     Social History Social History   Tobacco Use  . Smoking status: Never Smoker  . Smokeless tobacco: Never Used  Substance Use Topics  . Alcohol use: No    Alcohol/week: 0.0 oz  . Drug use: No    Review of Systems Constitutional: No fever/chills.  No lightheadedness or syncope. Eyes: No visual changes.  No eye discharge. ENT: No sore throat. No congestion or rhinorrhea. Cardiovascular: Denies chest pain. Denies palpitations. Respiratory: Denies shortness of breath.  No cough. Gastrointestinal: No abdominal pain.  No nausea, no vomiting.  No diarrhea.  No constipation.  Positive wound dehiscence in the lower abdomen.  The Genitourinary: Negative for dysuria. Musculoskeletal: Positive improving low back and right hip pain. Skin: Negative for rash. Neurological: Negative for headaches. No focal numbness, tingling or weakness.     ____________________________________________   PHYSICAL EXAM:  VITAL SIGNS: ED Triage Vitals [10/03/17 1934]  Enc Vitals Group     BP      Pulse      Resp      Temp      Temp src      SpO2      Weight      Height      Head Circumference      Peak Flow  Pain Score 0     Pain Loc      Pain Edu?      Excl. in [place]?     Constitutional: Alert and oriented. Well appearing and in no acute distress. Answers questions appropriately. Eyes: Conjunctivae are normal.  EOMI. No scleral icterus. Head: Atraumatic. Nose: No congestion/rhinnorhea. Mouth/Throat: Mucous membranes are moist.  Neck: No stridor.  Supple.  No meningismus. Cardiovascular: Normal rate, regular rhythm. No murmurs, rubs or gallops.  Respiratory: Normal respiratory effort.  No accessory muscle use or retractions. Lungs CTAB.  No wheezes, rales or ronchi. Gastrointestinal: Morbidly obese.  Soft, nontender and nondistended.  No guarding or rebound.  No peritoneal signs.  The patient has a 0.5 cm area of wound  dehiscence over her horizontal surgical scar with blood and pus discharge when pushed upon.  There is no surrounding erythema or swelling; no tenderness to palpation. Musculoskeletal: No LE edema.  Neurologic:  A&Ox3.  Speech is clear.  Face and smile are symmetric.  EOMI.  Moves all extremities well. Skin:  Skin is warm, dry and intact. No rash noted. Psychiatric: Mood and affect are normal. Speech and behavior are normal.  Normal judgement.  ____________________________________________   LABS (all labs ordered are listed, but only abnormal results are displayed)  Labs Reviewed  CULTURE, BLOOD (ROUTINE X 2)  CULTURE, BLOOD (ROUTINE X 2)  CBC  BASIC METABOLIC PANEL  URINALYSIS, COMPLETE (UACMP) WITH MICROSCOPIC   ____________________________________________  EKG  Not indicated ____________________________________________  RADIOLOGY  No results found.  ____________________________________________   PROCEDURES  Procedure(s) performed: None  Procedures  Critical Care performed: No ____________________________________________   INITIAL IMPRESSION / ASSESSMENT AND PLAN / ED COURSE  Pertinent labs & imaging results that were available during my care of the patient were reviewed by me and considered in my medical decision making (see chart for details).  67 y.o. email status post ostomy takedown 1/19, presenting with wound dehiscence resulting in blood and purulent discharge.  Overall, the patient is hemodynamically stable and has not been having any systemic illness.  She may have a localized area of infection, versus a more significant soft tissue or even intra-abdominal infection.  We will get a CT for further evaluation.  Antibiotics will be administered at this time.  ----------------------------------------- 7:57 PM on 10/03/2017 -----------------------------------------  I have spoken to Dr. Burt Knack, who is aware that the patient has come to the emergency  department for wound dehiscence.  The patient will be signed out to Dr. Archie Balboa, who will follow up the results of her CT scan, and discuss a final plan with Dr. Burt Knack.  If the CT scan does not show any deep infection, it is likely that she will be discharged home with oral antibiotics and outpatient clinic follow-up.  ____________________________________________  FINAL CLINICAL IMPRESSION(S) / ED DIAGNOSES  Final diagnoses:  Wound dehiscence         NEW MEDICATIONS STARTED DURING THIS VISIT:  New Prescriptions   No medications on file      Eula Listen, MD 10/03/17 2010

## 2017-10-05 ENCOUNTER — Ambulatory Visit (INDEPENDENT_AMBULATORY_CARE_PROVIDER_SITE_OTHER): Payer: PPO | Admitting: Surgery

## 2017-10-05 ENCOUNTER — Encounter: Payer: Self-pay | Admitting: Surgery

## 2017-10-05 VITALS — BP 142/91 | HR 85 | Temp 97.7°F | Ht 63.0 in

## 2017-10-05 DIAGNOSIS — S301XXA Contusion of abdominal wall, initial encounter: Secondary | ICD-10-CM | POA: Diagnosis not present

## 2017-10-05 NOTE — Patient Instructions (Signed)
Please remove old packing from wound and replace with new packing. Please place a dressing over the area.   You may shower as usual. Please see your follow up appointment listed below.

## 2017-10-05 NOTE — Progress Notes (Signed)
Outpatient Surgical Follow Up  10/05/2017  Kara [name] is an 67 y.o. female.   Chief Complaint  Patient presents with  . Follow-up    Wound dishiscence-Seen ED 10/03/17    HPI: 67 year old well-known to our practice with a prior history of Hartman's procedure followed by sigmoid colectomy and loop ileostomy.  Her last surgery was a loop ileostomy takedown in January of this year Dr. Adonis Huguenin. Recently presented to the emergency room for drainage of the abdominal wall with abdominal pain.  She reports the pain was mild and there was some serosanguineous fluid coming out of 1 of the openings to the abdominal wall.  No fevers or chills.  She is taking p.o. and having bowel movement.  CT scan personal review showing evidence of fluid collection consistent with a seroma.  No evidence of any intra-abdominal pathology and widely patent anastomosis without leak. Large ventral hernia.  Past Medical History:  Diagnosis Date  . Anginal pain ([place])   . 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 CHF (congestive heart failure) ([place])   . Diabetes ([place])   . Diverticulitis   . Hypertension   . PE (pulmonary thromboembolism) ([place])   . 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[facility])     Past Surgical History:  Procedure Laterality Date  . APPENDECTOMY N/A 02/24/2017   Procedure: Incidental  APPENDECTOMY;  Surgeon: [name], MD;  Location: [name] ORS;  Service: General;  Laterality: N/A;  . CARPAL TUNNEL RELEASE Bilateral   . COLON RESECTION SIGMOID N/A 11/02/2016   Procedure: COLON RESECTION SIGMOID;  Surgeon: [name], MD;  Location: [name] ORS;  Service: General;  Laterality: N/A;  . COLON SURGERY  11/02/2016  . COLONOSCOPY WITH PROPOFOL N/A 02/03/2017   Procedure: COLONOSCOPY WITH PROPOFOL;  Surgeon: Lin Landsman, MD;  Location[facility] ENDOSCOPY;  Service: Gastroenterology;  Laterality: N/A;  . COLOSTOMY N/A 11/02/2016   Procedure: COLOSTOMY;  Surgeon:  [name], MD;  Location: [name] ORS;  Service: General;  Laterality: N/A;  . COLOSTOMY REVERSAL N/A 02/24/2017   Procedure: COLOSTOMY REVERSAL, ostomy takedown, spleenic flexure mobilization, excision rectal stump/distal sigmoid, anastomosis with suture reinforcement;  Surgeon: [name], MD;  Location: [name] ORS;  Service: General;  Laterality: N/A;  . ILEO LOOP DIVERSION N/A 02/24/2017   Procedure: ILEO LOOP COLOSTOMY;  Surgeon: [name], MD;  Location: [name] ORS;  Service: General;  Laterality: N/A;  . ILEOSTOMY CLOSURE N/A 06/01/2017   Procedure: LOOP ILEOSTOMY TAKEDOWN;  Surgeon: [name], MD;  Location: [name] ORS;  Service: General;  Laterality: N/A;  . IVC FILTER INSERTION Right 10/2016  . IVC FILTER REMOVAL N/A 08/09/2017   Procedure: IVC FILTER REMOVAL;  Surgeon: Katha Cabal, MD;  Location: [place] CV LAB;  Service: Cardiovascular;  Laterality: N/A;  . KNEE SURGERY     torn menicus  . LAPAROSCOPY N/A 02/24/2017   Procedure: LAPAROSCOPY DIAGNOSTIC;  Surgeon: [name], MD;  Location: [name] ORS;  Service: General;  Laterality: N/A;  . LYSIS OF ADHESION N/A 02/24/2017   Procedure: LYSIS OF ADHESION;  Surgeon: [name], MD;  Location: [name] ORS;  Service: General;  Laterality: N/A;  . PULMONARY VENOGRAPHY N/A 11/19/2016   Procedure: Pulmonary Venography; IVC filter placement; possible pulmonary thrombectomy;  Surgeon: Katha Cabal, MD;  Location: [place] CV LAB;  Service: Cardiovascular;  Laterality: N/A;  . SIGMOIDOSCOPY N/A 11/13/2016   Procedure: endoscopic  flexible SIGMOIDOSCOPY;  Surgeon: Florene Glen, MD;  Location: [name] ORS;  Service: General;  Laterality: N/A;  . SIGMOIDOSCOPY N/A 05/19/2017   Procedure: FLEXIBLE SIGMOIDOSCOPY;  Surgeon: [name], MD;  Location: [name] ORS;  Service: General;  Laterality: N/A;    Family History  Problem Relation Age of Onset  . Diabetes Mother   . Heart disease Mother   .  Cancer Mother   . Breast cancer Mother   . Heart disease Father   . Diabetes Sister   . Heart disease Sister   . Breast cancer Cousin     Social History:  reports that she has never smoked. She has never used smokeless tobacco. She reports that she does not drink alcohol or use drugs.  Allergies:  Allergies  Allergen Reactions  . Adhesive [Tape] Other (See Comments)    Pt reports, when removing tape from skin most tapes pull her skin off. Ok to use paper tape  . Morphine Nausea Only  . Other Hives and Other (See Comments)    Chlorhexadine wipes/CHG wipes, Pt reports, when removing tape from skin most tapes pull her skin off. Ok to use paper tape  . Latex Rash  . Morphine And Related Nausea And Vomiting    Medications reviewed.    ROS Full ROS performed and is otherwise negative other than what is stated in HPI   BP (!) 142/91   Pulse 85   Temp 97.7 F (36.5 C) (Oral)   Ht '5\' 3"'  (1.6 m)   BMI 38.62 kg/m   Physical Exam  Constitutional: She is oriented to person, place, and time. She appears well-developed and well-nourished. No distress.  Pulmonary/Chest: Effort normal. No respiratory distress.  Abdominal: Soft. She exhibits no distension and no mass. There is no guarding.  Right 1 cm wound w serous drainage, no infection. 1/2 inch packing placed.  Neurological: She is alert and oriented to person, place, and time.  Skin: Capillary refill takes less than 2 seconds.  Psychiatric: She has a normal mood and affect. Her behavior is normal. Judgment and thought content normal.  Nursing note and vitals reviewed.      Results for orders placed or performed during the hospital encounter of 10/03/17 (from the past 48 hour(s))  CBC     Status: Abnormal   Collection Time: 10/03/17  8:51 PM  Result Value Ref Range   WBC 9.9 3.6 - 11.0 K/uL   RBC 5.48 (H) 3.80 - 5.20 MIL/uL   Hemoglobin 16.5 (H) 12.0 - 16.0 g/dL   HCT 49.7 (H) 35.0 - 47.0 %   MCV 90.7 80.0 - 100.0 fL    MCH 30.1 26.0 - 34.0 pg   MCHC 33.2 32.0 - 36.0 g/dL   RDW 14.2 11.5 - 14.5 %   Platelets 200 150 - 440 K/uL    Comment: Performed at [facility], [address] [place],  [address]  Basic metabolic panel     Status: Abnormal   Collection Time: 10/03/17  8:51 PM  Result Value Ref Range   Sodium 134 (L) 135 - 145 mmol/L   Potassium 4.0 3.5 - 5.1 mmol/L   Chloride 104 101 - 111 mmol/L   CO2 22 22 - 32 mmol/L   Glucose, Bld 218 (H) 65 - 99 mg/dL   BUN 19 6 - 20 mg/dL   Creatinine, Ser 0.63 0.44 - 1.00 mg/dL   Calcium 8.3 (L) 8.9 - 10.3 mg/dL   GFR calc non Af Amer >60 >60 mL/min   GFR calc Af Amer >  60 >60 mL/min    Comment: (NOTE) The eGFR has been calculated using the CKD EPI equation. This calculation has not been validated in all clinical situations. eGFR's persistently <60 mL/min signify possible Chronic Kidney Disease.    Anion gap 8 5 - 15    Comment: Performed at [facility], [address] [place], [place] [address]  Blood culture (routine x 2)     Status: None (Preliminary result)   Collection Time: 10/03/17  8:51 PM  Result Value Ref Range   Specimen Description BLOOD BLOOD RIGHT FOREARM    Special Requests      BOTTLES DRAWN AEROBIC AND ANAEROBIC Blood Culture adequate volume   Culture      NO GROWTH 2 DAYS Performed at [facility], [address], [place], [place] [address]    Report Status PENDING   Blood culture (routine x 2)     Status: None (Preliminary result)   Collection Time: 10/03/17  8:51 PM  Result Value Ref Range   Specimen Description BLOOD RIGHT ANTECUBITAL    Special Requests      BOTTLES DRAWN AEROBIC AND ANAEROBIC Blood Culture adequate volume   Culture      NO GROWTH 2 DAYS Performed at [facility], [address], [place], [place] [address]    Report Status PENDING    Ct Abdomen Pelvis W Contrast  Result Date: 10/03/2017 CLINICAL DATA:  Reopened abdominal wound after colostomy repair  EXAM: CT ABDOMEN AND PELVIS WITH CONTRAST TECHNIQUE: Multidetector CT imaging of the abdomen and pelvis was performed using the standard protocol following bolus administration of intravenous contrast. CONTRAST:  39m ISOVUE-370 IOPAMIDOL (ISOVUE-370) INJECTION 76% COMPARISON:  CT 04/21/2017, 03/05/2017 FINDINGS: Lower chest: Lung bases demonstrate small foci of left posterior pleural thickening. No acute consolidation or pleural effusion. Normal heart size. Hepatobiliary: Calcified stones in the gallbladder. Gallbladder slightly enlarged at 4 cm. No surrounding edema. No focal hepatic abnormality or biliary enlargement. Pancreas: Unremarkable. No pancreatic ductal dilatation or surrounding inflammatory changes. Spleen: Normal in size without focal abnormality. Adrenals/Urinary Tract: Subcentimeter hypodensity mid left kidney too small to further characterize. No hydronephrosis. Air within the bladder. Stomach/Bowel: The stomach is nonenlarged. No dilated small bowel. Interval takedown of previously noted right abdominal ileostomy. Sagittal views demonstrate a linear density extending toward the skin surface at the site of the prior ileostomy, likely reflecting a sinus tract. Within the deep subcutaneous soft tissues, superficial to the abdominal wall is and irregular soft tissue and fluid density measuring 2.4 cm AP by 4.3 cm transverse. No colon wall thickening. Vascular/Lymphatic: Mild aortic atherosclerosis. No aneurysmal dilatation. Removal of IVC filter. No increasing adenopathy Reproductive: Uterus and bilateral adnexa are unremarkable. Other: Negative for free air or free fluid. Supraumbilical ventral hernia containing a portion of transverse colon in addition to mesenteric fat. Musculoskeletal: Probable transitional anatomy of the lumbar spine. Grade 1 anterolisthesis of L4 on L5. Multiple level degenerative changes. IMPRESSION: 1. Interval takedown of the right abdominal ileostomy. Residual soft tissue and  fluid density measuring 4.3 x 2.4 cm within the deep subcutaneous soft tissues which may reflect slowly resolving operative fluid collection, with or without the presence of infection. This appears contiguous with linear density coursing toward the skin surface suggesting a sinus tract. 2. Multiple gallstones 3. Ventral hernia containing mesenteric fat and portion of transverse colon 4. Air within the urinary bladder, likely from recent instrumentation Electronically Signed   By: KDonavan FoilM.D.   On: 10/03/2017 23:32  Assessment/Plan: 61-year-old female with now draining seroma.  No evidence of anastomotic leak or fistula.  We will do daily packing of this wound and let it heal by secondary intention.  She has already been prescribed antibiotics so we will let her continue and finish those.  No need for surgical intervention.  As far as her ventral hernia she is not interested in any surgical intervention at this time.  We will follow in a couple weeks. Greater than 50% of the 25 minutes  visit was spent in counseling/coordination of care   Caroleen Hamman, MD [place] Surgeon

## 2017-10-08 LAB — CULTURE, BLOOD (ROUTINE X 2)
CULTURE: NO GROWTH
CULTURE: NO GROWTH
Special Requests: ADEQUATE
Special Requests: ADEQUATE

## 2017-10-10 DIAGNOSIS — M5416 Radiculopathy, lumbar region: Secondary | ICD-10-CM | POA: Diagnosis not present

## 2017-10-10 DIAGNOSIS — M47816 Spondylosis without myelopathy or radiculopathy, lumbar region: Secondary | ICD-10-CM | POA: Diagnosis not present

## 2017-10-10 DIAGNOSIS — M545 Low back pain: Secondary | ICD-10-CM | POA: Diagnosis not present

## 2017-10-11 ENCOUNTER — Other Ambulatory Visit: Payer: Self-pay | Admitting: Student

## 2017-10-11 DIAGNOSIS — M5416 Radiculopathy, lumbar region: Secondary | ICD-10-CM

## 2017-10-12 DIAGNOSIS — Z1231 Encounter for screening mammogram for malignant neoplasm of breast: Secondary | ICD-10-CM | POA: Diagnosis not present

## 2017-10-12 DIAGNOSIS — I10 Essential (primary) hypertension: Secondary | ICD-10-CM | POA: Diagnosis not present

## 2017-10-12 DIAGNOSIS — Z86711 Personal history of pulmonary embolism: Secondary | ICD-10-CM | POA: Diagnosis not present

## 2017-10-12 DIAGNOSIS — E119 Type 2 diabetes mellitus without complications: Secondary | ICD-10-CM | POA: Diagnosis not present

## 2017-10-12 DIAGNOSIS — Z95828 Presence of other vascular implants and grafts: Secondary | ICD-10-CM | POA: Diagnosis not present

## 2017-10-12 DIAGNOSIS — M5136 Other intervertebral disc degeneration, lumbar region: Secondary | ICD-10-CM | POA: Diagnosis not present

## 2017-10-12 DIAGNOSIS — S31109A Unspecified open wound of abdominal wall, unspecified quadrant without penetration into peritoneal cavity, initial encounter: Secondary | ICD-10-CM | POA: Diagnosis not present

## 2017-10-12 DIAGNOSIS — K5730 Diverticulosis of large intestine without perforation or abscess without bleeding: Secondary | ICD-10-CM | POA: Diagnosis not present

## 2017-10-12 DIAGNOSIS — Z9889 Other specified postprocedural states: Secondary | ICD-10-CM | POA: Diagnosis not present

## 2017-10-12 DIAGNOSIS — Z0000 Encounter for general adult medical examination without abnormal findings: Secondary | ICD-10-CM | POA: Diagnosis not present

## 2017-10-13 ENCOUNTER — Encounter: Payer: Self-pay | Admitting: Nurse Practitioner

## 2017-10-13 ENCOUNTER — Other Ambulatory Visit: Payer: Self-pay

## 2017-10-13 ENCOUNTER — Ambulatory Visit: Payer: PPO | Attending: Nurse Practitioner | Admitting: Nurse Practitioner

## 2017-10-13 VITALS — BP 143/66 | HR 85 | Temp 97.7°F | Resp 16 | Ht 63.0 in | Wt 218.0 lb

## 2017-10-13 DIAGNOSIS — M5442 Lumbago with sciatica, left side: Secondary | ICD-10-CM

## 2017-10-13 DIAGNOSIS — Z7984 Long term (current) use of oral hypoglycemic drugs: Secondary | ICD-10-CM | POA: Diagnosis not present

## 2017-10-13 DIAGNOSIS — M533 Sacrococcygeal disorders, not elsewhere classified: Secondary | ICD-10-CM | POA: Diagnosis not present

## 2017-10-13 DIAGNOSIS — I110 Hypertensive heart disease with heart failure: Secondary | ICD-10-CM | POA: Diagnosis not present

## 2017-10-13 DIAGNOSIS — M25552 Pain in left hip: Secondary | ICD-10-CM | POA: Diagnosis not present

## 2017-10-13 DIAGNOSIS — Z789 Other specified health status: Secondary | ICD-10-CM

## 2017-10-13 DIAGNOSIS — G8929 Other chronic pain: Secondary | ICD-10-CM | POA: Insufficient documentation

## 2017-10-13 DIAGNOSIS — I5022 Chronic systolic (congestive) heart failure: Secondary | ICD-10-CM | POA: Insufficient documentation

## 2017-10-13 DIAGNOSIS — M899 Disorder of bone, unspecified: Secondary | ICD-10-CM | POA: Diagnosis not present

## 2017-10-13 DIAGNOSIS — Z933 Colostomy status: Secondary | ICD-10-CM | POA: Insufficient documentation

## 2017-10-13 DIAGNOSIS — Z79899 Other long term (current) drug therapy: Secondary | ICD-10-CM | POA: Insufficient documentation

## 2017-10-13 DIAGNOSIS — M5441 Lumbago with sciatica, right side: Secondary | ICD-10-CM | POA: Diagnosis not present

## 2017-10-13 DIAGNOSIS — M5116 Intervertebral disc disorders with radiculopathy, lumbar region: Secondary | ICD-10-CM | POA: Insufficient documentation

## 2017-10-13 DIAGNOSIS — Z86711 Personal history of pulmonary embolism: Secondary | ICD-10-CM | POA: Diagnosis not present

## 2017-10-13 DIAGNOSIS — Z5181 Encounter for therapeutic drug level monitoring: Secondary | ICD-10-CM | POA: Diagnosis not present

## 2017-10-13 DIAGNOSIS — G894 Chronic pain syndrome: Secondary | ICD-10-CM | POA: Diagnosis not present

## 2017-10-13 DIAGNOSIS — E1142 Type 2 diabetes mellitus with diabetic polyneuropathy: Secondary | ICD-10-CM | POA: Diagnosis not present

## 2017-10-13 DIAGNOSIS — K5730 Diverticulosis of large intestine without perforation or abscess without bleeding: Secondary | ICD-10-CM | POA: Insufficient documentation

## 2017-10-13 DIAGNOSIS — Z7982 Long term (current) use of aspirin: Secondary | ICD-10-CM | POA: Diagnosis not present

## 2017-10-13 DIAGNOSIS — Z79891 Long term (current) use of opiate analgesic: Secondary | ICD-10-CM | POA: Insufficient documentation

## 2017-10-13 NOTE — Patient Instructions (Addendum)
____________________________________________________________________________________________  Appointment Policy Summary  It is our goal and responsibility to provide the medical community with assistance in the evaluation and management of patients with chronic pain. Unfortunately our resources are limited. Because we do not have an unlimited amount of time, or available appointments, we are required to closely monitor and manage their use. The following rules exist to maximize their use:  Patient's responsibilities: 1. Punctuality:  At what time should I arrive? You should be physically present in our office 30 minutes before your scheduled appointment. Your scheduled appointment is with your assigned healthcare provider. However, it takes 5-10 minutes to be "checked-in", and another 15 minutes for the nurses to do the admission. If you arrive to our office at the time you were given for your appointment, you will end up being at least 20-25 minutes late to your appointment with the provider. 2. Tardiness:  What happens if I arrive only a few minutes after my scheduled appointment time? You will need to reschedule your appointment. The cutoff is your appointment time. This is why it is so important that you arrive at least 30 minutes before that appointment. If you have an appointment scheduled for 10:00 AM and you arrive at 10:01, you will be required to reschedule your appointment.  3. Plan ahead:  Always assume that you will encounter traffic on your way in. Plan for it. If you are dependent on a driver, make sure they understand these rules and the need to arrive early. 4. Other appointments and responsibilities:  Avoid scheduling any other appointments before or after your pain clinic appointments.  5. Be prepared:  Write down everything that you need to discuss with your healthcare provider and give this information to the admitting nurse. Write down the medications that you will need  refilled. Bring your pills and bottles (even the empty ones), to all of your appointments, except for those where a procedure is scheduled. 6. No children or pets:  Find someone to take care of them. It is not appropriate to bring them in. 7. Scheduling changes:  We request "advanced notification" of any changes or cancellations. 8. Advanced notification:  Defined as a time period of more than 24 hours prior to the originally scheduled appointment. This allows for the appointment to be offered to other patients. 9. Rescheduling:  When a visit is rescheduled, it will require the cancellation of the original appointment. For this reason they both fall within the category of "Cancellations".  10. Cancellations:  They require advanced notification. Any cancellation less than 24 hours before the  appointment will be recorded as a "No Show". 11. No Show:  Defined as an unkept appointment where the patient failed to notify or declare to the practice their intention or inability to keep the appointment.  Corrective process for repeat offenders:  1. Tardiness: Three (3) episodes of rescheduling due to late arrivals will be recorded as one (1) "No Show". 2. Cancellation or reschedule: Three (3) cancellations or rescheduling will be recorded as one (1) "No Show". 3. "No Shows": Three (3) "No Shows" within a 12 month period will result in discharge from the practice. ____________________________________________________________________________________________  ____________________________________________________________________________________________  Pain Scale  Introduction: The pain score used by this practice is the Verbal Numerical Rating Scale (VNRS-11). This is an 11-point scale. It is for adults and children 10 years or older. There are significant differences in how the pain score is reported, used, and applied. Forget everything you learned in the past and learn  this scoring system.  General  Information: The scale should reflect your current level of pain. Unless you are specifically asked for the level of your worst pain, or your average pain. If you are asked for one of these two, then it should be understood that it is over the past 24 hours.  Basic Activities of Daily Living (ADL): Personal hygiene, dressing, eating, transferring, and using restroom.  Instructions: Most patients tend to report their level of pain as a combination of two factors, their physical pain and their psychosocial pain. This last one is also known as "suffering" and it is reflection of how physical pain affects you socially and psychologically. From now on, report them separately. From this point on, when asked to report your pain level, report only your physical pain. Use the following table for reference.  Pain Clinic Pain Levels (0-5/10)  Pain Level Score  Description  No Pain 0   Mild pain 1 Nagging, annoying, but does not interfere with basic activities of daily living (ADL). Patients are able to eat, bathe, get dressed, toileting (being able to get on and off the toilet and perform personal hygiene functions), transfer (move in and out of bed or a chair without assistance), and maintain continence (able to control bladder and bowel functions). Blood pressure and heart rate are unaffected. A normal heart rate for a healthy adult ranges from 60 to 100 bpm (beats per minute).   Mild to moderate pain 2 Noticeable and distracting. Impossible to hide from other people. More frequent flare-ups. Still possible to adapt and function close to normal. It can be very annoying and may have occasional stronger flare-ups. With discipline, patients may get used to it and adapt.   Moderate pain 3 Interferes significantly with activities of daily living (ADL). It becomes difficult to feed, bathe, get dressed, get on and off the toilet or to perform personal hygiene functions. Difficult to get in and out of bed or a chair  without assistance. Very distracting. With effort, it can be ignored when deeply involved in activities.   Moderately severe pain 4 Impossible to ignore for more than a few minutes. With effort, patients may still be able to manage work or participate in some social activities. Very difficult to concentrate. Signs of autonomic nervous system discharge are evident: dilated pupils (mydriasis); mild sweating (diaphoresis); sleep interference. Heart rate becomes elevated (>115 bpm). Diastolic blood pressure (lower number) rises above 100 mmHg. Patients find relief in laying down and not moving.   Severe pain 5 Intense and extremely unpleasant. Associated with frowning face and frequent crying. Pain overwhelms the senses.  Ability to do any activity or maintain social relationships becomes significantly limited. Conversation becomes difficult. Pacing back and forth is common, as getting into a comfortable position is nearly impossible. Pain wakes you up from deep sleep. Physical signs will be obvious: pupillary dilation; increased sweating; goosebumps; brisk reflexes; cold, clammy hands and feet; nausea, vomiting or dry heaves; loss of appetite; significant sleep disturbance with inability to fall asleep or to remain asleep. When persistent, significant weight loss is observed due to the complete loss of appetite and sleep deprivation.  Blood pressure and heart rate becomes significantly elevated. Caution: If elevated blood pressure triggers a pounding headache associated with blurred vision, then the patient should immediately seek attention at an urgent or emergency care unit, as these may be signs of an impending stroke.    Emergency Department Pain Levels (6-10/10)  Emergency Room Pain 6 Severely   limiting. Requires emergency care and should not be seen or managed at an outpatient pain management facility. Communication becomes difficult and requires great effort. Assistance to reach the emergency department  may be required. Facial flushing and profuse sweating along with potentially dangerous increases in heart rate and blood pressure will be evident.   Distressing pain 7 Self-care is very difficult. Assistance is required to transport, or use restroom. Assistance to reach the emergency department will be required. Tasks requiring coordination, such as bathing and getting dressed become very difficult.   Disabling pain 8 Self-care is no longer possible. At this level, pain is disabling. The individual is unable to do even the most "basic" activities such as walking, eating, bathing, dressing, transferring to a bed, or toileting. Fine motor skills are lost. It is difficult to think clearly.   Incapacitating pain 9 Pain becomes incapacitating. Thought processing is no longer possible. Difficult to remember your own name. Control of movement and coordination are lost.   The worst pain imaginable 10 At this level, most patients pass out from pain. When this level is reached, collapse of the autonomic nervous system occurs, leading to a sudden drop in blood pressure and heart rate. This in turn results in a temporary and dramatic drop in blood flow to the brain, leading to a loss of consciousness. Fainting is one of the body's self defense mechanisms. Passing out puts the brain in a calmed state and causes it to shut down for a while, in order to begin the healing process.    Summary: 1. Refer to this scale when providing [place] with your pain level. 2. Be accurate and careful when reporting your pain level. This will help with your care. 3. Over-reporting your pain level will lead to loss of credibility. 4. Even a level of 1/10 means that there is pain and will be treated at our facility. 5. High, inaccurate reporting will be documented as "Symptom Exaggeration", leading to loss of credibility and suspicions of possible secondary gains such as obtaining more narcotics, or wanting to appear disabled, for  fraudulent reasons. 6. Only pain levels of 5 or below will be seen at our facility. 7. Pain levels of 6 and above will be sent to the Emergency Department and the appointment cancelled. ____________________________________________________________________________________________   BMI Assessment: Estimated body mass index is 38.62 kg/m as calculated from the following:   Height as of this encounter: 5\' 3"  (1.6 m).   Weight as of this encounter: 218 lb (98.9 kg).  BMI interpretation table: BMI level Category Range association with higher incidence of chronic pain  <18 kg/m2 Underweight   18.5-24.9 kg/m2 Ideal body weight   25-29.9 kg/m2 Overweight Increased incidence by 20%  30-34.9 kg/m2 Obese (Class I) Increased incidence by 68%  35-39.9 kg/m2 Severe obesity (Class II) Increased incidence by 136%  >40 kg/m2 Extreme obesity (Class III) Increased incidence by 254%   BMI Readings from Last 4 Encounters:  10/13/17 38.62 kg/m  10/05/17 38.62 kg/m  10/02/17 38.62 kg/m  08/22/17 43.08 kg/m   Wt Readings from Last 4 Encounters:  10/13/17 218 lb (98.9 kg)  10/02/17 218 lb (98.9 kg)  08/22/17 228 lb (103.4 kg)  08/09/17 220 lb (99.8 kg)

## 2017-10-13 NOTE — Progress Notes (Signed)
Safety precautions to be maintained throughout the outpatient stay will include: orient to surroundings, keep bed in low position, maintain call bell within reach at all times, provide assistance with transfer out of bed and ambulation.  

## 2017-10-13 NOTE — Progress Notes (Signed)
Patient states she will return on 10/11/2017 to get labs drawn and for urine sample. This was okay with Corky Downs, MP.

## 2017-10-13 NOTE — Progress Notes (Signed)
Patient's Name: Kara [name]  MRN: [id number]  Referring Provider: Tracie Harrier, MD  DOB: Nov 20, 1950  PCP: Tracie Harrier, MD  DOS: 10/13/2017  Note by: Dionisio David NP  Service setting: Ambulatory outpatient  Specialty: Interventional Pain Management  Location: [name] (AMB) Pain Management Facility    Patient type: New Patient    Primary Reason(s) for Visit: Initial Patient Evaluation CC: No chief complaint on file.  HPI  Ms. Renville is a 67 y.o. year old, female patient, who comes today for an initial evaluation. She has Diabetes mellitus type 2 in obese ([place]); Type 2 diabetes mellitus without complication, without long-term current use of insulin ([place]); BP (high blood pressure); Nocturnal leg cramps; Cough; Chronic low back pain without sciatica; Acute hip pain, left; Acute non-recurrent maxillary sinusitis; Diverticulitis large intestine; Diverticulitis; DDD (degenerative disc disease), lumbar; Benign essential hypertension; Edema of both legs; Morbidly obese ([place]); Acute on chronic systolic (congestive) heart failure ([place]); HCAP (healthcare-associated pneumonia); Encounter for removal of staples; Other pulmonary embolism without acute cor pulmonale ([place]); History of colostomy reversal; Ileostomy in place [facility]); Positive result for methicillin resistant Staphylococcus aureus (MRSA) screening; H/O ileostomy; Diverticulosis of colon; History of pulmonary embolism; S/P IVC filter; Chronic bilateral low back pain with bilateral sciatica (Primary Area of Pain) (L>R); Chronic sacroiliac joint pain (Secondary Area of Pain) (R); Chronic pain syndrome; Long term current use of opiate analgesic; Pharmacologic therapy; Disorder of skeletal system; and Problems influencing health status on their problem list.. Her primarily concern today is the No chief complaint on file.  Pain Assessment: Location: Lower, Mid Back Radiating: Denies  Onset: 1 to 4 weeks ago Duration: Acute  pain Quality: Aching, Constant, Nagging Severity: 9 /10 (subjective, self-reported pain score)  Note: Reported level is compatible with observation. Clinically the patient looks like a 3/10 A 3/10 is viewed as "Moderate" and described as significantly interfering with activities of daily living (ADL). It becomes difficult to feed, bathe, get dressed, get on and off the toilet or to perform personal hygiene functions. Difficult to get in and out of bed or a chair without assistance. Very distracting. With effort, it can be ignored when deeply involved in activities. Information on the proper use of the pain scale provided to the patient today. When using our objective Pain Scale, levels between 6 and 10/10 are said to belong in an emergency room, as it progressively worsens from a 6/10, described as severely limiting, requiring emergency care not usually available at an outpatient pain management facility. At a 6/10 level, communication becomes difficult and requires great effort. Assistance to reach the emergency department may be required. Facial flushing and profuse sweating along with potentially dangerous increases in heart rate and blood pressure will be evident. Effect on ADL: Bending, kneeling, lifting, motion, standing, squatting, stooping, and walking Timing: Constant Modifying factors: Lying down, hot packs, cold packs, sleeping, and resting.  BP: (!) 143/66  HR: 85  Onset and Duration: Present less than 3 months Cause of pain: not noted Severity: No change since onset and NAS-11 on the average: 10/10 Timing: During activity or exercise Aggravating Factors: Bending, Kneeling, Lifiting, Motion, Prolonged standing, Squatting, Stooping  and Walking Alleviating Factors: Cold packs, Hot packs, Lying down, Resting and Sleeping Associated Problems: Numbness and Tingling Quality of Pain: Aching, Constant, Nagging and Stabbing Previous Examinations or Tests: CT scan Previous Treatments: Epidural  steroid injections, Steroid treatments by mouth and TENS  The patient comes into the clinics today for the first time for a  chronic pain management evaluation. According to the patient her primary area of pain is in her right buttock area. She admits that this is an acute pain for 3 weeks after pushing a refrigerator and hearing a pop. She admits that she has a history of low back pain and is followed by neurosurgeon in [place]. She admits that she does get epidural steroid injections. She admits that she got an injection since her injury however it was not effective. She admits that the injections in the past were effective for her chronic back pain. She admits that she has had recent images.  Her second area of pain is in her legs. He admits that she has numbness and tingling but does have peripheral neuropathy from diabetes.  Today I took the time to provide the patient with information regarding this pain practice. The patient was informed that the practice is divided into two sections: an interventional pain management section, as well as a completely separate and distinct medication management section. I explained that there are procedure days for interventional therapies, and evaluation days for follow-ups and medication management. Because of the amount of documentation required during both, they are kept separated. This means that there is the possibility that she may be scheduled for a procedure on one day, and medication management the next. I have also informed her that because of staffing and facility limitations, this practice will no longer take patients for medication management only. To illustrate the reasons for this, I gave the patient the example of surgeons, and how inappropriate it would be to refer a patient to his/her care, just to write for the post-surgical antibiotics on a surgery done by a different surgeon.   Because interventional pain management is part of the  board-certified specialty for the doctors, the patient was informed that joining this practice means that they are open to any and all interventional therapies. I made it clear that this does not mean that they will be forced to have any procedures done. What this means is that I believe interventional therapies to be essential part of the diagnosis and proper management of chronic pain conditions. Therefore, patients not interested in these interventional alternatives will be better served under the care of a different practitioner.  The patient was also made aware of my Comprehensive Pain Management Safety Guidelines where by joining this practice, they limit all of their nerve blocks and joint injections to those done by our practice, for as long as we are retained to manage their care. Historic Controlled Substance Pharmacotherapy Review  PMP and historical list of controlled substances: hydrocodone/acetaminophen 5/325, tramadol 50 mg, oxycodone/acetaminophen 5/325 mg, oxycodone/acetaminophen 7.5/325 mg, hydrocodone/acetaminophen 5/300 mg, Highest opioid analgesic regimen found:hydrocodone/acetaminophen 7.5/325 one tablet 6 times daily( fill date 03/09/2017)hydrocodone 45 milligrams per day Most recent opioid analgesic: hydrocodone/acetaminophen 5/325 one tablet 4 hours (fill date 10/02/2017) 30 mg per day Current opioid analgesics: None Highest recorded MME/day: 5 mg/day MME/day: 0 mg/day Medications: The patient did not bring the medication(s) to the appointment, as requested in our "New Patient Package" Pharmacodynamics: Desired effects: Analgesia: The patient reports >50% benefit. Reported improvement in function: The patient reports medication allows her to accomplish basic ADLs. Clinically meaningful improvement in function (CMIF): Sustained CMIF goals met Perceived effectiveness: Described as relatively effective, allowing for increase in activities of daily living (ADL) Undesirable  effects: Side-effects or Adverse reactions: None reported Historical Monitoring: The patient  reports that she does not use drugs. List of all UDS Test(s): No  results found for: MDMA, COCAINSCRNUR, PCPSCRNUR, PCPQUANT, CANNABQUANT, THCU, [place] List of all Serum Drug Screening Test(s):  No results found for: AMPHSCRSER, BARBSCRSER, BENZOSCRSER, COCAINSCRSER, PCPSCRSER, PCPQUANT, THCSCRSER, CANNABQUANT, OPIATESCRSER, OXYSCRSER, PROPOXSCRSER Historical Background Evaluation: [place] PDMP: Six (6) year initial data search conducted.             [place] Department of public safety, offender search: Editor, commissioning Information) Non-contributory Risk Assessment Profile: Aberrant behavior: None observed or detected today Risk factors for fatal opioid overdose: None identified today Fatal overdose hazard ratio (HR): Calculation deferred Non-fatal overdose hazard ratio (HR): Calculation deferred Risk of opioid abuse or dependence: 0.7-3.0% with doses ? 36 MME/day and 6.1-26% with doses ? 120 MME/day. Substance use disorder (SUD) risk level: Pending results of Medical Psychology Evaluation for SUD Opioid risk tool (ORT) (Total Score): 0  ORT Scoring interpretation table:  Score <3 = Low Risk for SUD  Score between 4-7 = Moderate Risk for SUD  Score >8 = High Risk for Opioid Abuse   PHQ-2 Depression Scale:  Total score: 0  PHQ-2 Scoring interpretation table: (Score and probability of major depressive disorder)  Score 0 = No depression  Score 1 = 15.4% Probability  Score 2 = 21.1% Probability  Score 3 = 38.4% Probability  Score 4 = 45.5% Probability  Score 5 = 56.4% Probability  Score 6 = 78.6% Probability   PHQ-9 Depression Scale:  Total score: 0  PHQ-9 Scoring interpretation table:  Score 0-4 = No depression  Score 5-9 = Mild depression  Score 10-14 = Moderate depression  Score 15-19 = Moderately severe depression  Score 20-27 = Severe depression (2.4 times higher risk of SUD and 2.89 times higher risk  of overuse)   Pharmacologic Plan: Pending ordered tests and/or consults  Meds  The patient has a current medication list which includes the following prescription(s): aspirin ec, candesartan, celecoxib, cyclobenzaprine, gabapentin, glipizide, multi-vitamins, and naproxen.  Current Outpatient Medications on File Prior to Visit  Medication Sig  . aspirin EC 81 MG tablet Take 81 mg by mouth daily.  . candesartan (ATACAND) 16 MG tablet Take by mouth.  . celecoxib (CELEBREX) 100 MG capsule   . cyclobenzaprine (FLEXERIL) 10 MG tablet Take 10 mg by mouth at bedtime as needed for muscle spasms.   Marland Kitchen gabapentin (NEURONTIN) 100 MG capsule Take 100 mg by mouth 3 (three) times daily.  Marland Kitchen glipiZIDE (GLUCOTROL XL) 5 MG 24 hr tablet Take 5 mg by mouth daily with breakfast.   . Multiple Vitamin (MULTI-VITAMINS) TABS Take 1 tablet by mouth daily.  . naproxen (NAPROSYN) 500 MG tablet Take 1 tablet (500 mg total) by mouth 2 (two) times daily with a meal.   No current facility-administered medications on file prior to visit.    Imaging Review  Lumbosacral Imaging: Lumbar MR wo contrast:  Results for orders placed during the hospital encounter of 03/27/16  MR LUMBAR SPINE WO CONTRAST   Narrative CLINICAL DATA:  Low back pain for 6 years. Pain sometimes radiates to right hip.  EXAM: MRI LUMBAR SPINE WITHOUT CONTRAST  TECHNIQUE: Multiplanar, multisequence MR imaging of the lumbar spine was performed. No intravenous contrast was administered.  COMPARISON:  Lumbar spine MRI 04/20/2014  FINDINGS: Segmentation: Normal. The lowest disc space is considered to be L5-S1.  Alignment:  Unchanged grade 1 L4-L5 anterolisthesis.  Vertebrae: Schmorl's nodes at T11-T12. Unchanged L1 hemangioma. No acute compression fracture or evidence of discitis osteomyelitis. 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  Conus medullaris: Extends to  the L2 level and appears normal.  Paraspinal and other soft  tissues: The visualized aorta, IVC and iliac vessels are normal. The visualized retroperitoneal organs and paraspinal soft tissues are normal.  Disc levels:  T12-L1: No significant disc herniation, spinal canal stenosis or neuroforaminal narrowing.  L1-L2: Normal disc space and facet joints. No spinal canal stenosis. No neuroforaminal stenosis.  L2-L3: Normal disc space and facet joints. No spinal canal stenosis. No neuroforaminal stenosis.  L3-L4: Severe bilateral facet hypertrophy. Small central extrusion with mild superior migration, unchanged. No spinal canal stenosis. Unchanged mild bilateral neuroforaminal stenosis.  L4-L5: Grade 1 anterolisthesis with pseudo disc bulge and moderate bilateral facet hypertrophy. No spinal canal stenosis. Unchanged moderate bilateral neuroforaminal stenosis.  L5-S1: Severe bilateral facet hypertrophy. Left subarticular disc extrusion with cranial migration approximately 75% height of the L5 vertebral body. This narrows the left lateral recess and displaces the descending left S1 nerve root. No central spinal canal stenosis. No neuroforaminal stenosis.  IMPRESSION: 1. New left subarticular disc extrusion at L5-S1 with cranial migration approximately 75% height of the L5 vertebral body. This causes stenosis of the left lateral recess. Correlate for symptoms of left S1 radiculopathy. 2. Unchanged moderate bilateral L4-L5 neural foraminal stenosis due to combination of disc bulge and facet arthrosis. 3. Unchanged L3-L4 central disc extrusion with mild superior migration and severe facet arthrosis with resultant mild bilateral foraminal narrowing.   Electronically Signed   By: Ulyses Jarred M.D.   On: 03/28/2016 00:54    Lumbar DG (Complete) 4+V:  Results for orders placed during the hospital encounter of 10/02/17  DG Lumbar Spine Complete   Narrative CLINICAL DATA:  Acute low back pain following injury 1 month ago. Initial  encounter.  EXAM: LUMBAR SPINE - COMPLETE 4+ VIEW  COMPARISON:  04/21/2017 abdominal/pelvic CT and prior studies  FINDINGS: Grade 1 anterolisthesis of L4 on L5 is unchanged from 2017.  No acute fracture or subluxation identified.  Mild to moderate multilevel degenerative disc disease/spondylosis and moderate multilevel facet arthropathy noted.  No focal bony lesions or spondylolysis identified.  IMPRESSION: 1. No evidence of acute abnormality 2. Multilevel degenerative changes and grade 1 spondylolisthesis at L4-5 again noted.   Electronically Signed   By: Margarette Canada M.D.   On: 10/02/2017 15:22   Hip Imaging:  Hip-R DG 2-3 views:  Results for orders placed during the hospital encounter of 09/26/17  DG Hip Unilat W or Wo Pelvis 2-3 Views Right   Narrative CLINICAL DATA:  Injury  EXAM: DG HIP (WITH OR WITHOUT PELVIS) 2-3V RIGHT  COMPARISON:  None.  FINDINGS: No acute fracture. No dislocation. Osteopenia. Mild narrowing of the right hip joint space which juxta-articular sclerosis.  IMPRESSION: No acute bony pathology.   Electronically Signed   By: Marybelle Killings M.D.   On: 09/26/2017 17:48   Note: Available results from prior imaging studies were reviewed.        ROS  Cardiovascular History: Daily Aspirin intake and High blood pressure Pulmonary or Respiratory History: No reported pulmonary signs or symptoms such as wheezing and difficulty taking a deep full breath (Asthma), difficulty blowing air out (Emphysema), coughing up mucus (Bronchitis), persistent dry cough, or temporary stoppage of breathing during sleep Neurological History: No reported neurological signs or symptoms such as seizures, abnormal skin sensations, urinary and/or fecal incontinence, being born with an abnormal open spine and/or a tethered spinal cord Review of Past Neurological Studies: No results found for this or any previous visit. Psychological-Psychiatric History: No  reported  psychological or psychiatric signs or symptoms such as difficulty sleeping, anxiety, depression, delusions or hallucinations (schizophrenial), mood swings (bipolar disorders) or suicidal ideations or attempts Gastrointestinal History: No reported gastrointestinal signs or symptoms such as vomiting or evacuating blood, reflux, heartburn, alternating episodes of diarrhea and constipation, inflamed or scarred liver, or pancreas or irrregular and/or infrequent bowel movements Genitourinary History: No reported renal or genitourinary signs or symptoms such as difficulty voiding or producing urine, peeing blood, non-functioning kidney, kidney stones, difficulty emptying the bladder, difficulty controlling the flow of urine, or chronic kidney disease Hematological History: Brusing easily Endocrine History: diabetes Rheumatologic History: Joint aches and or swelling due to excess weight (Osteoarthritis) Musculoskeletal History: Negative for myasthenia gravis, muscular dystrophy, multiple sclerosis or malignant hyperthermia Work History: Retired  Allergies  Ms. Baucum is allergic to adhesive [tape]; morphine; other; latex; and morphine and related.  Laboratory Chemistry  Inflammation Markers No results found for: CRP, ESRSEDRATE (CRP: Acute Phase) (ESR: Chronic Phase) Renal Function Markers Lab Results  Component Value Date   BUN 19 10/03/2017   CREATININE 0.63 10/03/2017   GFRAA >60 10/03/2017   GFRNONAA >60 10/03/2017   Hepatic Function Markers Lab Results  Component Value Date   AST 40 04/21/2017   ALT 53 04/21/2017   ALBUMIN 3.8 04/21/2017   ALKPHOS 88 04/21/2017   Electrolytes Lab Results  Component Value Date   NA 134 (L) 10/03/2017   K 4.0 10/03/2017   CL 104 10/03/2017   CALCIUM 8.3 (L) 10/03/2017   MG 2.0 11/18/2016   Neuropathy Markers No results found for: [id number] Bone Pathology Markers Lab Results  Component Value Date   ALKPHOS 88 04/21/2017   CALCIUM  8.3 (L) 10/03/2017   Coagulation Parameters Lab Results  Component Value Date   PLT 200 10/03/2017   Cardiovascular Markers Lab Results  Component Value Date   BNP 60.0 11/17/2016   HGB 16.5 (H) 10/03/2017   HCT 49.7 (H) 10/03/2017   Note: Lab results reviewed.  PFSH  Drug: Ms. Borrelli  reports that she does not use drugs. Alcohol:  reports that she does not drink alcohol. Tobacco:  reports that she has never smoked. She has never used smokeless tobacco. 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 Family: family history includes Breast cancer in her cousin and mother; Cancer in her mother; Diabetes in her mother and sister; Heart disease in her father, mother, and sister.  Past Surgical History:  Procedure Laterality Date  . APPENDECTOMY N/A 02/24/2017   Procedure: Incidental  APPENDECTOMY;  Surgeon: [name], MD;  Location: [name] ORS;  Service: General;  Laterality: N/A;  . CARPAL TUNNEL RELEASE Bilateral   . COLON RESECTION SIGMOID N/A 11/02/2016   Procedure: COLON RESECTION SIGMOID;  Surgeon: [name], MD;  Location: [name] ORS;  Service: General;  Laterality: N/A;  . COLON SURGERY  11/02/2016  . COLONOSCOPY WITH PROPOFOL N/A 02/03/2017   Procedure: COLONOSCOPY WITH PROPOFOL;  Surgeon: Lin Landsman, MD;  Location: [facility] ENDOSCOPY;  Service: Gastroenterology;  Laterality: N/A;  . COLOSTOMY N/A 11/02/2016   Procedure: COLOSTOMY;  Surgeon: [name], MD;  Location: [name] ORS;  Service: General;  Laterality: N/A;  . COLOSTOMY REVERSAL N/A 02/24/2017   Procedure: COLOSTOMY REVERSAL, ostomy takedown, spleenic flexure mobilization, excision rectal stump/distal sigmoid, anastomosis with suture reinforcement;  Surgeon: [name], MD;  Location: [name] ORS;  Service: General;  Laterality: N/A;  . ILEO LOOP DIVERSION N/A 02/24/2017   Procedure: ILEO LOOP COLOSTOMY;  Surgeon: [name], MD;  Location: [name] ORS;  Service: General;  Laterality: N/A;  . ILEOSTOMY CLOSURE N/A 06/01/2017   Procedure: LOOP ILEOSTOMY TAKEDOWN;  Surgeon: [name], MD;  Location: [name] ORS;  Service: General;  Laterality: N/A;  . IVC FILTER INSERTION Right 10/2016  . IVC FILTER REMOVAL N/A 08/09/2017   Procedure: IVC FILTER REMOVAL;  Surgeon: Katha Cabal, MD;  Location: [place] LAB;  Service: Cardiovascular;  Laterality: N/A;  . KNEE SURGERY     torn menicus  . LAPAROSCOPY N/A 02/24/2017   Procedure: LAPAROSCOPY DIAGNOSTIC;  Surgeon: [name], MD;  Location: [name] ORS;  Service: General;  Laterality: N/A;  . LYSIS OF ADHESION N/A 02/24/2017   Procedure: LYSIS OF ADHESION;  Surgeon: [name], MD;  Location: [name] ORS;  Service: General;  Laterality: N/A;  . PULMONARY VENOGRAPHY N/A 11/19/2016   Procedure: Pulmonary Venography; IVC filter placement; possible pulmonary thrombectomy;  Surgeon: Katha Cabal, MD;  Location: [facility] LAB;  Service: Cardiovascular;  Laterality: N/A;  . SIGMOIDOSCOPY N/A 11/13/2016   Procedure: endoscopic  flexible SIGMOIDOSCOPY;  Surgeon: Florene Glen, MD;  Location: [name] ORS;  Service: General;  Laterality: N/A;  . SIGMOIDOSCOPY N/A 05/19/2017   Procedure: Beryle Quant;  Surgeon: [name], MD;  Location: [name] ORS;  Service: General;  Laterality: N/A;   Active Ambulatory Problems    Diagnosis Date Noted  . Diabetes mellitus type 2 in obese ([place]) 11/12/2014  . Type 2 diabetes mellitus without complication, without long-term current use of insulin ([place]) 11/12/2014  . BP (high blood pressure) 11/12/2014  . Nocturnal leg cramps 02/20/2015  . Cough 07/30/2015  . Chronic low back pain without sciatica 11/14/2015  . Acute hip pain, left 02/23/2016  . Acute non-recurrent  maxillary sinusitis 05/21/2016  . Diverticulitis large intestine 09/29/2016  . Diverticulitis 10/03/2016  . DDD (degenerative disc disease), lumbar 07/14/2016  . Benign essential hypertension 08/26/2016  . Edema of both legs 11/15/2016  . Morbidly obese ([place]) 11/15/2016  . Acute on chronic systolic (congestive) heart failure ([place]) 11/18/2016  . HCAP (healthcare-associated pneumonia) 11/18/2016  . Encounter for removal of staples   . Other pulmonary embolism without acute cor pulmonale ([place]) 11/23/2016  . History of colostomy reversal 02/24/2017  . Ileostomy in place [facility]) 04/08/2017  . Positive result for methicillin resistant Staphylococcus aureus (MRSA) screening 05/11/2017  . H/O ileostomy 06/01/2017  . Diverticulosis of colon 06/20/2017  . History of pulmonary embolism 06/20/2017  . S/P IVC filter 06/20/2017  . Chronic bilateral low back pain with bilateral sciatica (Primary Area of Pain) (L>R) 10/13/2017  . 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 Chronic pain syndrome 10/13/2017  . Long term current use of opiate analgesic 10/13/2017  . Pharmacologic therapy 10/13/2017  . Disorder of skeletal system 10/13/2017  . Problems influencing health status 10/13/2017   Resolved Ambulatory Problems    Diagnosis Date Noted  . Acute bronchitis 12/03/2014  . Colostomy in place [facility]) 11/15/2016   Past Medical History:  Diagnosis Date  . Anginal pain ([place])   . Arthritis   . CHF (congestive heart failure) ([place])   . Diabetes ([place])   . Diverticulitis   . Hypertension   . PE (pulmonary thromboembolism) ([place])   . 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 In no apparent acute distress Vitals:   10/13/17 1327  BP: (!) 143/66  Pulse: 85  Resp: 16  Temp: 97.7 F (36.5 C)  TempSrc: Oral  SpO2: 99%  Weight: 218 lb (98.9 kg)  Height: '5\' 3"'  (1.6 m)  Psych/Mental  status: Alert, oriented x 3 (person, place, & time)       Eyes: PERLA Respiratory: No evidence of acute respiratory distress   Lumbar Spine Exam  Inspection: No masses, redness, or swelling Alignment: Symmetrical Functional ROM: Unrestricted ROM      Stability: No instability detected Muscle strength & Tone: Functionally intact Sensory: Unimpaired Palpation: Complains of area being tender to palpation       Provocative Tests: Lumbar Hyperextension and rotation test: Positive bilaterally for facet joint pain. Patrick's Maneuver: Positive for right-sided S-I arthralgia              Gait & Posture Assessment  Ambulation: Unassisted Gait: Relatively normal for age and body habitus Posture: WNL   Lower Extremity Exam    Side: Right lower extremity  Side: Left lower extremity  Inspection: No masses, redness, swelling, or asymmetry. No contractures  Inspection: No masses, redness, swelling, or asymmetry. No contractures  Functional ROM: Unrestricted ROM          Functional ROM: Unrestricted ROM          Muscle strength & Tone: Functionally intact  Muscle strength & Tone: Functionally intact  Sensory: Unimpaired  Sensory: Unimpaired  Palpation: No palpable anomalies  Palpation: No palpable anomalies   Assessment  Primary Diagnosis & Pertinent Problem List: The primary encounter diagnosis was Chronic bilateral low back pain with bilateral sciatica (Primary Area of Pain) (L>R). 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  Visit Diagnosis: 1. Chronic bilateral low back pain with bilateral sciatica (Primary Area of Pain) (L>R)   2. 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 Chronic pain syndrome   4. Long term current use of opiate analgesic   5.  Pharmacologic therapy   6. Disorder of skeletal system   7. Problems influencing health status    Plan of Care  Initial treatment plan:  Please be advised that as per protocol, today's visit has been an evaluation only. We have not taken over the patient's controlled substance management.  Problem-specific plan: No problem-specific Assessment & Plan notes found for this encounter.  Ordered Lab-work, Procedure(s), Referral(s), & Consult(s): Orders Placed This Encounter  Procedures  . DG Si Joints  . Compliance Drug Analysis, Ur  . Magnesium  . Vitamin B12  . Sedimentation rate  . 25-Hydroxyvitamin D Lcms D2+D3  . C-reactive protein   Pharmacotherapy: Medications ordered:  No orders of the defined types were placed in this encounter.  Medications administered during this visit: [name] had no medications administered during this visit.   Pharmacotherapy under consideration:  Opioid Analgesics: The patient was informed that there is no guarantee that she would be a candidate for opioid analgesics. The decision will be made following CDC guidelines. This decision will be based on the results of diagnostic studies, as well as Ms. Hinderliter risk profile.  Membrane stabilizer: To be determined at a later time Muscle relaxant: To be determined at a later time NSAID: To be determined at  a later time Other analgesic(s): To be determined at a later time   Interventional therapies under consideration: Ms. Armon was informed that there is no guarantee that she would be a candidate for interventional therapies. The decision will be based on the results of diagnostic studies, as well as Ms. Littleton risk profile.  Possible procedure(s): Diagnostic right-sided LESI Diagnostic bilateral lumbar facet nerve block Possible Bilateral lumbar facet RFA Diagnostic right-sided sacroiliac joint injection Possible right-sided sacroiliac joint RFA    Provider-requested follow-up: Return for 2nd Visit, w/ Dr. Dossie Arbour.  Future Appointments  Date Time Provider [place]  10/27/2017  1:00 PM [name]  11/02/2017  9:30 AM Milinda Pointer, MD [name] None  11/18/2017  9:30 AM Pabon, Marjory Lies, MD [facility] None    Primary Care Physician: Tracie Harrier, MD Location: [facility] Note by:  Date: 10/13/2017; Time: 3:02 PM  Pain Score Disclaimer: We use the NRS-11 scale. This is a self-reported, subjective measurement of pain severity with only modest accuracy. It is used primarily to identify changes within a particular patient. It must be understood that outpatient pain scales are significantly less accurate that those used for research, where they can be applied under ideal controlled circumstances with minimal exposure to variables. In reality, the score is likely to be a combination of pain intensity and pain affect, where pain affect describes the degree of emotional arousal or changes in action readiness caused by the sensory experience of pain. Factors such as social and work situation, setting, emotional state, anxiety levels, expectation, and prior pain experience may influence pain perception and show large inter-individual differences that may also be affected by time variables.  Patient instructions provided during this appointment: Patient Instructions   ____________________________________________________________________________________________  Appointment Policy Summary  It is our goal and responsibility to provide the medical community with assistance in the evaluation and management of patients with chronic pain. Unfortunately our resources are limited. Because we do not have an unlimited amount of time, or available appointments, we are required to closely monitor and manage their use. The following rules exist to maximize their use:  Patient's  responsibilities: 1. Punctuality:  At what time should I arrive? You should be physically present in our office 30 minutes before your scheduled appointment. Your scheduled appointment is with your assigned healthcare provider. However, it takes 5-10 minutes to be "checked-in", and another 15 minutes for the nurses to do the admission. If you arrive to our office at the time you were given for your appointment, you will end up being at least 20-25 minutes late to your appointment with the provider. 2. Tardiness:  What happens if I arrive only a few minutes after my scheduled appointment time? You will need to reschedule your appointment. The cutoff is your appointment time. This is why it is so important that you arrive at least 30 minutes before that appointment. If you have an appointment scheduled for 10:00 AM and you arrive at 10:01, you will be required to reschedule your appointment.  3. Plan ahead:  Always assume that you will encounter traffic on your way in. Plan for it. If you are dependent on a driver, make sure they understand these rules and the need to arrive early. 4. Other appointments and responsibilities:  Avoid scheduling any other appointments before or after your pain clinic appointments.  5. Be prepared:  Write down everything that you need to discuss with your healthcare provider and give this information to the  admitting nurse. Write down the medications that you will need refilled. Bring your pills and bottles (even the empty ones), to all of your appointments, except for those where a procedure is scheduled. 6. No children or pets:  Find someone to take care of them. It is not appropriate to bring them in. 7. Scheduling changes:  We request "advanced notification" of any changes or cancellations. 8. Advanced notification:  Defined as a time period of more than 24 hours prior to the originally scheduled appointment. This allows for the appointment to be offered to other  patients. 9. Rescheduling:  When a visit is rescheduled, it will require the cancellation of the original appointment. For this reason they both fall within the category of "Cancellations".  10. Cancellations:  They require advanced notification. Any cancellation less than 24 hours before the  appointment will be recorded as a "No Show". 11. No Show:  Defined as an unkept appointment where the patient failed to notify or declare to the practice their intention or inability to keep the appointment.  Corrective process for repeat offenders:  1. Tardiness: Three (3) episodes of rescheduling due to late arrivals will be recorded as one (1) "No Show". 2. Cancellation or reschedule: Three (3) cancellations or rescheduling will be recorded as one (1) "No Show". 3. "No Shows": Three (3) "No Shows" within a 12 month period will result in discharge from the practice. ____________________________________________________________________________________________  ____________________________________________________________________________________________  Pain Scale  Introduction: The pain score used by this practice is the Verbal Numerical Rating Scale (VNRS-11). This is an 11-point scale. It is for adults and children 10 years or older. There are significant differences in how the pain score is reported, used, and applied. Forget everything you learned in the past and learn this scoring system.  General Information: The scale should reflect your current level of pain. Unless you are specifically asked for the level of your worst pain, or your average pain. If you are asked for one of these two, then it should be understood that it is over the past 24 hours.  Basic Activities of Daily Living (ADL): Personal hygiene, dressing, eating, transferring, and using restroom.  Instructions: Most patients tend to report their level of pain as a combination of two factors, their physical pain and their psychosocial  pain. This last one is also known as "suffering" and it is reflection of how physical pain affects you socially and psychologically. From now on, report them separately. From this point on, when asked to report your pain level, report only your physical pain. Use the following table for reference.  Pain Clinic Pain Levels (0-5/10)  Pain Level Score  Description  No Pain 0   Mild pain 1 Nagging, annoying, but does not interfere with basic activities of daily living (ADL). Patients are able to eat, bathe, get dressed, toileting (being able to get on and off the toilet and perform personal hygiene functions), transfer (move in and out of bed or a chair without assistance), and maintain continence (able to control bladder and bowel functions). Blood pressure and heart rate are unaffected. A normal heart rate for a healthy adult ranges from 60 to 100 bpm (beats per minute).   Mild to moderate pain 2 Noticeable and distracting. Impossible to hide from other people. More frequent flare-ups. Still possible to adapt and function close to normal. It can be very annoying and may have occasional stronger flare-ups. With discipline, patients may get used to it and adapt.   Moderate pain  3 Interferes significantly with activities of daily living (ADL). It becomes difficult to feed, bathe, get dressed, get on and off the toilet or to perform personal hygiene functions. Difficult to get in and out of bed or a chair without assistance. Very distracting. With effort, it can be ignored when deeply involved in activities.   Moderately severe pain 4 Impossible to ignore for more than a few minutes. With effort, patients may still be able to manage work or participate in some social activities. Very difficult to concentrate. Signs of autonomic nervous system discharge are evident: dilated pupils (mydriasis); mild sweating (diaphoresis); sleep interference. Heart rate becomes elevated (>115 bpm). Diastolic blood pressure  (lower number) rises above 100 mmHg. Patients find relief in laying down and not moving.   Severe pain 5 Intense and extremely unpleasant. Associated with frowning face and frequent crying. Pain overwhelms the senses.  Ability to do any activity or maintain social relationships becomes significantly limited. Conversation becomes difficult. Pacing back and forth is common, as getting into a comfortable position is nearly impossible. Pain wakes you up from deep sleep. Physical signs will be obvious: pupillary dilation; increased sweating; goosebumps; brisk reflexes; cold, clammy hands and feet; nausea, vomiting or dry heaves; loss of appetite; significant sleep disturbance with inability to fall asleep or to remain asleep. When persistent, significant weight loss is observed due to the complete loss of appetite and sleep deprivation.  Blood pressure and heart rate becomes significantly elevated. Caution: If elevated blood pressure triggers a pounding headache associated with blurred vision, then the patient should immediately seek attention at an urgent or emergency care unit, as these may be signs of an impending stroke.    Emergency Department Pain Levels (6-10/10)  Emergency Room Pain 6 Severely limiting. Requires emergency care and should not be seen or managed at an outpatient pain management facility. Communication becomes difficult and requires great effort. Assistance to reach the emergency department may be required. Facial flushing and profuse sweating along with potentially dangerous increases in heart rate and blood pressure will be evident.   Distressing pain 7 Self-care is very difficult. Assistance is required to transport, or use restroom. Assistance to reach the emergency department will be required. Tasks requiring coordination, such as bathing and getting dressed become very difficult.   Disabling pain 8 Self-care is no longer possible. At this level, pain is disabling. The individual is  unable to do even the most "basic" activities such as walking, eating, bathing, dressing, transferring to a bed, or toileting. Fine motor skills are lost. It is difficult to think clearly.   Incapacitating pain 9 Pain becomes incapacitating. Thought processing is no longer possible. Difficult to remember your own name. Control of movement and coordination are lost.   The worst pain imaginable 10 At this level, most patients pass out from pain. When this level is reached, collapse of the autonomic nervous system occurs, leading to a sudden drop in blood pressure and heart rate. This in turn results in a temporary and dramatic drop in blood flow to the brain, leading to a loss of consciousness. Fainting is one of the body's self defense mechanisms. Passing out puts the brain in a calmed state and causes it to shut down for a while, in order to begin the healing process.    Summary: 1. Refer to this scale when providing [place] with your pain level. 2. Be accurate and careful when reporting your pain level. This will help with your care. 3. Over-reporting your  pain level will lead to loss of credibility. 4. Even a level of 1/10 means that there is pain and will be treated at our facility. 5. High, inaccurate reporting will be documented as "Symptom Exaggeration", leading to loss of credibility and suspicions of possible secondary gains such as obtaining more narcotics, or wanting to appear disabled, for fraudulent reasons. 6. Only pain levels of 5 or below will be seen at our facility. 7. Pain levels of 6 and above will be sent to the Emergency Department and the appointment cancelled. ____________________________________________________________________________________________   BMI Assessment: Estimated body mass index is 38.62 kg/m as calculated from the following:   Height as of this encounter: '5\' 3"'  (1.6 m).   Weight as of this encounter: 218 lb (98.9 kg).  BMI interpretation table: BMI level  Category Range association with higher incidence of chronic pain  <18 kg/m2 Underweight   18.5-24.9 kg/m2 Ideal body weight   25-29.9 kg/m2 Overweight Increased incidence by 20%  30-34.9 kg/m2 Obese (Class I) Increased incidence by 68%  35-39.9 kg/m2 Severe obesity (Class II) Increased incidence by 136%  >40 kg/m2 Extreme obesity (Class III) Increased incidence by 254%   BMI Readings from Last 4 Encounters:  10/13/17 38.62 kg/m  10/05/17 38.62 kg/m  10/02/17 38.62 kg/m  08/22/17 43.08 kg/m   Wt Readings from Last 4 Encounters:  10/13/17 218 lb (98.9 kg)  10/02/17 218 lb (98.9 kg)  08/22/17 228 lb (103.4 kg)  08/09/17 220 lb (99.8 kg)

## 2017-10-18 ENCOUNTER — Ambulatory Visit (INDEPENDENT_AMBULATORY_CARE_PROVIDER_SITE_OTHER): Payer: PPO | Admitting: Surgery

## 2017-10-18 ENCOUNTER — Encounter: Payer: Self-pay | Admitting: Surgery

## 2017-10-18 VITALS — BP 104/61 | HR 78 | Temp 98.7°F | Wt 218.0 lb

## 2017-10-18 DIAGNOSIS — S301XXD Contusion of abdominal wall, subsequent encounter: Secondary | ICD-10-CM

## 2017-10-18 DIAGNOSIS — Z4889 Encounter for other specified surgical aftercare: Secondary | ICD-10-CM

## 2017-10-18 NOTE — Progress Notes (Signed)
Surgical Clinic Progress/Follow-up Note   HPI:  67 y.o. Female presents to clinic for follow-up evaluation of abdominal wall seroma s/p spontaneous drainage 4 months s/p most recent abdominal surgery, closure of diverting loop ileostomy Adonis Huguenin, 1/9). Patient reports the amount of gauze ribbon she has been able to pack into the wound has noticeably decreased. She also says she has been taking her antibiotics as prescribed with only a few more days left of antibiotics (specifically, she says she finished her Cipro and still has a few days left of Flagyl). She otherwise describes only mild pain with packing, denies N/V, fever/chills, CP, or SOB.  Review of Systems:  Constitutional: denies any other weight loss, fever, chills, or sweats  Eyes: denies any other vision changes, history of eye injury  ENT: denies sore throat, hearing problems  Respiratory: denies shortness of breath, wheezing  Cardiovascular: denies chest pain, palpitations  Gastrointestinal: abdominal pain, N/V, and bowel function as per HPI Musculoskeletal: denies any other joint pains or cramps  Skin: Denies any other rashes or skin discolorations  Neurological: denies any other headache, dizziness, weakness  Psychiatric: denies any other depression, anxiety  All other review of systems: otherwise negative   Vital Signs:  BP 104/61   Pulse 78   Temp 98.7 F (37.1 C) (Oral)   Wt 218 lb (98.9 kg)   BMI 38.62 kg/m    Physical Exam:  Constitutional:  -- Obese body habitus  -- Awake, alert, and oriented x3  Eyes:  -- Pupils equally round and reactive to light  -- No scleral icterus  Ear, nose, throat:  -- No jugular venous distension  -- No nasal drainage, bleeding Pulmonary:  -- No crackles -- Equal breath sounds bilaterally -- Breathing non-labored at rest Cardiovascular:  -- S1, S2 present  -- No pericardial rubs  Gastrointestinal:  -- Soft and obese, but non-distended, no guarding/rebound -- Right  abdominal 1 cm transverse linear wound, minimally tender except during gauze packing, no surrounding erythema and no drainage, former post-surgical wounds well-approximated without erythema -- No abdominal masses appreciated, pulsatile or otherwise  Musculoskeletal / Integumentary:  -- Wounds or skin discoloration: None appreciated except as described above (GI)  -- Extremities: B/L UE and LE FROM, hands and feet warm, no edema  Neurologic:  -- Motor function: intact and symmetric  -- Sensation: intact and symmetric   Laboratory studies:  CBC Latest Ref Rng & Units 10/03/2017 06/03/2017 06/02/2017  WBC 3.6 - 11.0 K/uL 9.9 9.3 11.9(H)  Hemoglobin 12.0 - 16.0 g/dL 16.5(H) 12.7 12.9  Hematocrit 35.0 - 47.0 % 49.7(H) 37.8 38.2  Platelets 150 - 440 K/uL 200 186 199   CMP Latest Ref Rng & Units 10/03/2017 06/03/2017 06/02/2017  Glucose 65 - 99 mg/dL 218(H) 141(H) 133(H)  BUN 6 - 20 mg/dL 19 11 11   Creatinine 0.44 - 1.00 mg/dL 0.63 0.62 0.61  Sodium 135 - 145 mmol/L 134(L) 140 139  Potassium 3.5 - 5.1 mmol/L 4.0 3.7 3.8  Chloride 101 - 111 mmol/L 104 108 109  CO2 22 - 32 mmol/L 22 22 22   Calcium 8.9 - 10.3 mg/dL 8.3(L) 8.1(L) 8.5(L)  Total Protein 6.5 - 8.1 g/dL - - -  Total Bilirubin 0.3 - 1.2 mg/dL - - -  Alkaline Phos 38 - 126 U/L - - -  AST 15 - 41 U/L - - -  ALT 14 - 54 U/L - - -    Imaging: No new pertinent imaging studies available for review since CT Abdomen  and Pelvis 5/13, prior to last follow-up appointment with Dr. Dahlia Byes, though personally reviewed  Assessment:  67 y.o. yo Female with a problem list including...  Patient Active Problem List   Diagnosis Date Noted  . Chronic bilateral low back pain with bilateral sciatica (Primary Area of Pain) (L>R) 10/13/2017  . 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 Chronic pain syndrome 10/13/2017  . Long term current use of opiate analgesic 10/13/2017  . Pharmacologic therapy 10/13/2017  . Disorder of  skeletal system 10/13/2017  . Problems influencing health status 10/13/2017  . Diverticulosis of colon 06/20/2017  . History of pulmonary embolism 06/20/2017  . S/P IVC filter 06/20/2017  . H/O ileostomy 06/01/2017  . Positive result for methicillin resistant Staphylococcus aureus (MRSA) screening 05/11/2017  . Ileostomy in place [facility]) 04/08/2017  . History of colostomy reversal 02/24/2017  . Other pulmonary embolism without acute cor pulmonale ([place]) 11/23/2016  . Acute on chronic systolic (congestive) heart failure ([place]) 11/18/2016  . HCAP (healthcare-associated pneumonia) 11/18/2016  . Encounter for removal of staples   . Edema of both legs 11/15/2016  . Morbidly obese ([place]) 11/15/2016  . Diverticulitis 10/03/2016  . Diverticulitis large intestine 09/29/2016  . Benign essential hypertension 08/26/2016  . DDD (degenerative disc disease), lumbar 07/14/2016  . Acute non-recurrent maxillary sinusitis 05/21/2016  . Acute hip pain, left 02/23/2016  . Chronic low back pain without sciatica 11/14/2015  . Cough 07/30/2015  . Nocturnal leg cramps 02/20/2015  . Diabetes mellitus type 2 in obese ([place]) 11/12/2014  . Type 2 diabetes mellitus without complication, without long-term current use of insulin ([place]) 11/12/2014  . BP (high blood pressure) 11/12/2014    presents to clinic for follow-up evaluation of abdominal wall seroma s/p spontaneous drainage, healing well 4 months s/p most recent abdominal surgery, closure of diverting loop ileostomy Adonis Huguenin, 1/9).  Plan:   - continue current wound care with daily packing  - complete prescribed course of antibiotics even if feeling better/well  - wound repacked with corner of dry sterile gauze, forceps, and cotton swab  - return to clinic in 3 - 4 weeks, instructed to call office if questions or concerns  All of the above recommendations were discussed with the patient and patient's family/friend, and all of patient's and family's/friend's  questions were answered to their expressed satisfaction.  -- Marilynne Drivers Rosana Hoes, MD, [place] for exceptional care. Office: [phone]

## 2017-10-18 NOTE — Patient Instructions (Addendum)
Please continue to pack the incision site at least once a day or as needed.  Please give [place] a call in case you have any questions or concerns.

## 2017-10-21 DIAGNOSIS — E119 Type 2 diabetes mellitus without complications: Secondary | ICD-10-CM | POA: Diagnosis not present

## 2017-10-21 DIAGNOSIS — M5416 Radiculopathy, lumbar region: Secondary | ICD-10-CM | POA: Diagnosis not present

## 2017-10-21 DIAGNOSIS — M5136 Other intervertebral disc degeneration, lumbar region: Secondary | ICD-10-CM | POA: Diagnosis not present

## 2017-10-21 DIAGNOSIS — I10 Essential (primary) hypertension: Secondary | ICD-10-CM | POA: Diagnosis not present

## 2017-10-27 ENCOUNTER — Ambulatory Visit
Admission: RE | Admit: 2017-10-27 | Discharge: 2017-10-27 | Disposition: A | Discharge status: Home / Self Care | Payer: PPO | Source: Ambulatory Visit | Attending: Student | Admitting: Student

## 2017-10-27 DIAGNOSIS — M48061 Spinal stenosis, lumbar region without neurogenic claudication: Secondary | ICD-10-CM | POA: Insufficient documentation

## 2017-10-27 DIAGNOSIS — M5116 Intervertebral disc disorders with radiculopathy, lumbar region: Secondary | ICD-10-CM | POA: Insufficient documentation

## 2017-10-27 DIAGNOSIS — M5416 Radiculopathy, lumbar region: Secondary | ICD-10-CM

## 2017-11-01 NOTE — Progress Notes (Signed)
Patient's Name: DEETRA BOOTON  MRN: [id number]  Referring Provider: Tracie Harrier, MD  DOB: 1950-08-17  PCP: Tracie Harrier, MD  DOS: 11/02/2017  Note by: Gaspar Cola, MD  Service setting: Ambulatory outpatient  Specialty: Interventional Pain Management  Location: [name] (AMB) Pain Management Facility    Patient type: Established   Primary Reason(s) for Visit: Encounter for evaluation before starting new chronic pain management plan of care (Level of risk: moderate) CC: Back Pain (lower)  HPI  Ms. Laprade is a 67 y.o. year old, female patient, who comes today for a follow-up evaluation to review the test results and decide on a treatment plan. She has Diabetes mellitus type 2 in obese ([place]); Type 2 diabetes mellitus without complication, without long-term current use of insulin ([place]); BP (high blood pressure); Nocturnal leg cramps; Cough; Chronic low back pain without sciatica; Chronic hip pain (Left); Acute non-recurrent maxillary sinusitis; Diverticulitis large intestine; Diverticulitis; DDD (degenerative disc disease), lumbar; Benign essential hypertension; Edema of both legs; Morbidly obese ([place]); Acute on chronic systolic (congestive) heart failure ([place]); HCAP (healthcare-associated pneumonia); Encounter for removal of staples; Other pulmonary embolism without acute cor pulmonale ([place]); History of colostomy reversal; Ileostomy in place [facility]); Positive result for methicillin resistant Staphylococcus aureus (MRSA) screening; H/O ileostomy; Diverticulosis of colon; History of pulmonary embolism; S/P IVC filter; Chronic low back pain (Primary Area of Pain) (Bilateral) w/ sciatica (Bilateral); Chronic sacroiliac joint pain (Secondary Area of Pain) (Right); Chronic pain syndrome; Long term current use of opiate analgesic; Pharmacologic therapy; Disorder of skeletal system; Problems influencing health status; Hyponatremia; Hypocalcemia; Sacral insufficiency fracture; Chronic  grade 1 L4-5 and L5-S1 anterolisthesis.; Lumbar foraminal stenosis (L3-4 and L4-5) (Bilateral); Lumbosacral L5-S1 subarticular lateral recess stenosis (Bilateral); Chronic Lumbar facet arthropathy (L3-4, L4-5, and L5-S1) (Bilateral); Lumbar facet syndrome; 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 Her primarily concern today is the Back Pain (lower)  Pain Assessment: Location: Lower, Mid Back Radiating: Pain radiates down both leg to toes, toes get numb at times Duration: Chronic pain Quality: Sharp, Shooting, Constant Severity: 9 /10 (subjective, self-reported pain score)  Note: Reported level is inconsistent with clinical observations. Clinically the patient looks like a 3/10 A 3/10 is viewed as "Moderate" and described as significantly interfering with activities of daily living (ADL). It becomes difficult to feed, bathe, get dressed, get on and off the toilet or to perform personal hygiene functions. Difficult to get in and out of bed or a chair without assistance. Very distracting. With effort, it can be ignored when deeply involved in activities. Information on the proper use of the pain scale provided to the patient today. When using our objective Pain Scale, levels between 6 and 10/10 are said to belong in an emergency room, as it progressively worsens from a 6/10, described as severely limiting, requiring emergency care not usually available at an outpatient pain management facility. At a 6/10 level, communication becomes difficult and requires great effort. Assistance to reach the emergency department may be required. Facial flushing and profuse sweating along with potentially dangerous increases in heart rate and blood pressure will be  evident. Effect on ADL: unable to bend or lift, kneeling, squatting, no prolong walking or standing Modifying factors: hot packs, cold packs, unable to sleeping and rest well BP: 126/71  HR:  22  Ms. Atkin comes in today for a follow-up visit after her initial evaluation on 10/13/2017. Today we went over the results of her tests. These were explained in "Layman's terms". During today's appointment we went over my diagnostic impression, as well as the proposed treatment plan.  According to the patient her primary area of pain is in her right buttock area. She admits that this is an acute pain for 3 weeks after pushing a refrigerator and hearing a pop. She admits that she has a history of low back pain and is followed by neurosurgeon in [place]. She admits that she does get epidural steroid injections. She admits that she got an injection since her injury however it was not effective. She admits that the injections in the past were effective for her chronic back pain. She admits that she has had recent images.  Her second area of pain is in her legs. He admits that she has numbness and tingling but does have peripheral neuropathy from diabetes.  In considering the treatment plan options, Ms. Buffalo was reminded that I no longer take patients for medication management only. I asked her to let me know if she had no intention of taking advantage of the interventional therapies, so that we could make arrangements to provide this space to someone interested. I also made it clear that undergoing interventional therapies for the purpose of getting pain medications is very inappropriate on the part of a patient, and it will not be tolerated in this practice. This type of behavior would suggest true addiction and therefore it requires referral to an addiction specialist.   Further details on both, my assessment(s), as well as the proposed treatment plan, please see below.  Controlled  Substance Pharmacotherapy Assessment REMS (Risk Evaluation and Mitigation Strategy)  Analgesic: None Highest recorded MME/day: 5 mg/day MME/day: 0 mg/day Pill Count: None expected due to no prior prescriptions written by our practice. No notes on file Pharmacokinetics: Liberation and absorption (onset of action): WNL Distribution (time to peak effect): WNL Metabolism and excretion (duration of action): WNL         Pharmacodynamics: Desired effects: Analgesia: Ms. Graciano reports >50% benefit. Functional ability: Patient reports that medication allows her to accomplish basic ADLs Clinically meaningful improvement in function (CMIF): Sustained CMIF goals met Perceived effectiveness: Described as relatively effective, allowing for increase in activities of daily living (ADL) Undesirable effects: Side-effects or Adverse reactions: None reported Monitoring: [place] PMP: Online review of the past 1-monthperiod previously conducted. Not applicable at this point since we have not taken over the patient's medication management yet. List of other Serum/Urine Drug Screening Test(s):  No results found. List of all UDS test(s) done:  No results found. Last UDS on record: No results found. UDS interpretation: No unexpected findings.          Medication Assessment Form: Patient introduced to form today Treatment compliance: Treatment may start today if patient agrees with proposed plan. Evaluation of compliance is not applicable at this point Risk Assessment Profile: Aberrant behavior: See initial evaluations. None observed or detected today Comorbid factors increasing risk of overdose: See initial evaluation. No additional risks detected today Medical Psychology Evaluation: Please see scanned results in medical record. Opioid Risk Tool - 10/13/17 1345      Family History of Substance Abuse   Alcohol  Negative    Illegal Drugs  Negative    Rx Drugs  Negative      Personal History of  Substance Abuse  Alcohol  Negative    Illegal Drugs  Negative    Rx Drugs  Negative      Age   Age between 83-45 years   No      History of Preadolescent Sexual Abuse   History of Preadolescent Sexual Abuse  Negative or Female      Psychological Disease   Psychological Disease  Negative    Depression  Negative      Total Score   Opioid Risk Tool Scoring  0    Opioid Risk Interpretation  Low Risk      ORT Scoring interpretation table:  Score <3 = Low Risk for SUD  Score between 4-7 = Moderate Risk for SUD  Score >8 = High Risk for Opioid Abuse   Risk Mitigation Strategies:  Patient opioid safety counseling: Completed today. Counseling provided to patient as per "Patient Counseling Document". Document signed by patient, attesting to counseling and understanding Patient-Prescriber Agreement (PPA): Obtained today.  Controlled substance notification to other providers: Written and sent today.  Pharmacologic Plan: Today we may be taking over the patient's pharmacological regimen. See below.             Laboratory Chemistry  Inflammation Markers (CRP: Acute Phase) (ESR: Chronic Phase) No results found.  Rheumatology Markers No results found.  Renal Function Markers Lab Results  Component Value Date   BUN 19 10/03/2017   CREATININE 0.63 10/03/2017   GFRAA >60 10/03/2017   GFRNONAA >60 10/03/2017                             Hepatic Function Markers Lab Results  Component Value Date   AST 40 04/21/2017   ALT 53 04/21/2017   ALBUMIN 3.8 04/21/2017   ALKPHOS 88 04/21/2017   LIPASE 22 10/31/2016                        Electrolytes Lab Results  Component Value Date   NA 134 (L) 10/03/2017   K 4.0 10/03/2017   CL 104 10/03/2017   CALCIUM 8.3 (L) 10/03/2017   MG 2.0 11/18/2016   PHOS 3.3 11/07/2016                        Neuropathy Markers Lab Results  Component Value Date   HGBA1C 7.2 (H) 02/17/2017   HIV Non Reactive 11/18/2016                        Bone  Pathology Markers No results found.  Coagulation Parameters Lab Results  Component Value Date   PLT 200 10/03/2017                        Cardiovascular Markers Lab Results  Component Value Date   BNP 60.0 11/17/2016   CKTOTAL 71 10/02/2011   CKMB < 0.5 (L) 10/02/2011   TROPONINI <0.03 11/17/2016   HGB 16.5 (H) 10/03/2017   HCT 49.7 (H) 10/03/2017                         CA Markers No results found.  Note: Lab results reviewed.  Recent Diagnostic Imaging Review  Lumbosacral Imaging: Lumbar MR wo contrast:  Results for orders placed during the hospital encounter of 10/27/17  MR LUMBAR SPINE WO CONTRAST   Narrative CLINICAL DATA:  Lumbar  radiculopathy  EXAM: MRI LUMBAR SPINE WITHOUT CONTRAST  TECHNIQUE: Multiplanar, multisequence MR imaging of the lumbar spine was performed. No intravenous contrast was administered.  COMPARISON:  Abdominal CT 10/03/2017. 03/27/2016 MRI.  FINDINGS: Segmentation: Open S1-2 interspace when counting from the lowest ribs.  Alignment:  Grade 1 anterolisthesis at L4-5 and L5-S1.  Vertebrae:  Chronic bilateral pars defects at L3 and L4.  Horizontal fracture across the S2 body extending into the bilateral sacral ala. There is mild presacral soft tissue edema. No narrowing seen along the sacral canal and visible foramina.  Conus medullaris and cauda equina: Conus extends to the L1 level. Conus and cauda equina appear normal.  Paraspinal and other soft tissues: Presacral edema as noted above.  Disc levels:  T12- L1: Unremarkable.  L1-L2: Tiny pro3trusion suggested on sagittal slices.  No impingement  L2-L3: Posterior element hypertrophy. Negative disc space. No compressive stenosis.  L3-L4: Chronic bilateral L3 pars defects. The disc is narrowed and bulging with a small superiorly directed protrusion. Moderate bilateral foraminal narrowing. Patent spinal canal  L4-L5: Chronic bilateral L4 pars defects with anterolisthesis.  The disc is narrowed and bulging. Bilateral foraminal stenosis with impingement worse on the left where there is L4 flattening on sagittal images. Mild spinal canal narrowing.  L5-S1:Facet arthropathy with spurring and anterolisthesis. The disc is narrowed and bulging with a central upward pointing herniation that has regressed from prior. Moderate bilateral foraminal narrowing. Bilateral S1 impingement in the subarticular recesses.  IMPRESSION: 1. Acute to subacute sacral insufficiency fracture with transverse and horizontal components. 2. Disc and facet degeneration with chronic bilateral L3 and L4 pars defects. Chronic L4-5 and L5-S1 anterolisthesis. 3. Bilateral foraminal narrowing at L3-4 and below with impingement greatest on the left at L4-5. 4. L5-S1 bilateral subarticular recess narrowing that could affect either S1 nerve root.   Electronically Signed   By: Monte Fantasia M.D.   On: 10/27/2017 15:32    Lumbar DG (Complete) 4+V:  Results for orders placed during the hospital encounter of 10/02/17  DG Lumbar Spine Complete   Narrative CLINICAL DATA:  Acute low back pain following injury 1 month ago. Initial encounter.  EXAM: LUMBAR SPINE - COMPLETE 4+ VIEW  COMPARISON:  04/21/2017 abdominal/pelvic CT and prior studies  FINDINGS: Grade 1 anterolisthesis of L4 on L5 is unchanged from 2017.  No acute fracture or subluxation identified.  Mild to moderate multilevel degenerative disc disease/spondylosis and moderate multilevel facet arthropathy noted.  No focal bony lesions or spondylolysis identified.  IMPRESSION: 1. No evidence of acute abnormality 2. Multilevel degenerative changes and grade 1 spondylolisthesis at L4-5 again noted.   Electronically Signed   By: Margarette Canada M.D.   On: 10/02/2017 15:22    Hip Imaging: Hip-R DG 2-3 views:  Results for orders placed during the hospital encounter of 09/26/17  DG Hip Unilat W or Wo Pelvis 2-3 Views Right    Narrative CLINICAL DATA:  Injury  EXAM: DG HIP (WITH OR WITHOUT PELVIS) 2-3V RIGHT  COMPARISON:  None.  FINDINGS: No acute fracture. No dislocation. Osteopenia. Mild narrowing of the right hip joint space which juxta-articular sclerosis.  IMPRESSION: No acute bony pathology.   Electronically Signed   By: Marybelle Killings M.D.   On: 09/26/2017 17:48    Complexity Note: Imaging results reviewed. Results shared with Ms. Mcpeters, using Layman's terms.                         Meds  Current Outpatient Medications:  .  aspirin EC 81 MG tablet, Take 81 mg by mouth daily., Disp: , Rfl:  .  candesartan (ATACAND) 16 MG tablet, Take 8 mg by mouth daily. , Disp: , Rfl:  .  celecoxib (CELEBREX) 100 MG capsule, Take 100 mg by mouth 2 (two) times daily. , Disp: , Rfl:  .  cyclobenzaprine (FLEXERIL) 10 MG tablet, Take 10 mg by mouth at bedtime. , Disp: , Rfl:  .  gabapentin (NEURONTIN) 100 MG capsule, Take 200 mg by mouth 3 (three) times daily. , Disp: , Rfl:  .  glipiZIDE (GLUCOTROL XL) 5 MG 24 hr tablet, Take 5 mg by mouth daily with breakfast. , Disp: , Rfl:  .  Multiple Vitamin (MULTI-VITAMINS) TABS, Take 1 tablet by mouth daily., Disp: , Rfl:   ROS  Constitutional: Denies any fever or chills Gastrointestinal: No reported hemesis, hematochezia, vomiting, or acute GI distress Musculoskeletal: Denies any acute onset joint swelling, redness, loss of ROM, or weakness Neurological: No reported episodes of acute onset apraxia, aphasia, dysarthria, agnosia, amnesia, paralysis, loss of coordination, or loss of consciousness  Allergies  Ms. Primm is allergic to adhesive [tape]; morphine; other; latex; and morphine and related.  PFSH  Drug: Ms. Buresh  reports that she does not use drugs. Alcohol:  reports that she does not drink alcohol. Tobacco:  reports that she has never smoked. She has never used smokeless tobacco. 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 Surgical: Ms. Skluzacek  has a past surgical history that includes Knee surgery; Carpal tunnel release (Bilateral); Colon resection sigmoid (N/A, 11/02/2016); Colostomy (N/A, 11/02/2016); Sigmoidoscopy (N/A, 11/13/2016); PULMONARY VENOGRAPHY (N/A, 11/19/2016); Colonoscopy with propofol (N/A, 02/03/2017); Colon surgery (11/02/2016); IVC FILTER INSERTION (Right, 10/2016); Colostomy reversal (N/A, 02/24/2017); Appendectomy (N/A, 02/24/2017); Lysis of adhesion (N/A, 02/24/2017); laparoscopy (N/A, 02/24/2017); Ileo loop diversion (N/A, 02/24/2017); Sigmoidoscopy (N/A, 05/19/2017); Ileostomy closure (N/A, 06/01/2017); and IVC FILTER REMOVAL (N/A, 08/09/2017). Family: family history includes Breast cancer in her cousin and mother; Cancer in her mother; Diabetes in her mother and sister; Heart disease in her father, mother, and sister.  Constitutional Exam  General appearance: Well nourished, well developed, and well hydrated. In no apparent acute distress Vitals:   11/02/17 0931  BP: 126/71  Pulse: 84  Temp: 97.8 F (36.6 C)  SpO2: 95%  Weight: 228 lb (103.4 kg)  Height: '5\' 3"'  (1.6 m)   BMI Assessment: Estimated body mass index is 40.39 kg/m as calculated from the following:   Height as of this encounter: '5\' 3"'  (1.6 m).   Weight as of this encounter: 228 lb (103.4 kg).  BMI interpretation table: BMI level Category Range association with higher incidence of chronic pain  <18 kg/m2 Underweight   18.5-24.9 kg/m2 Ideal body weight   25-29.9 kg/m2 Overweight Increased incidence by 20%  30-34.9 kg/m2 Obese (Class I) Increased incidence by 68%  35-39.9 kg/m2 Severe obesity (Class II) Increased incidence by 136%  >40 kg/m2 Extreme obesity (Class III) Increased incidence by 254%   Patient's current BMI Ideal Body  weight  Body mass index is 40.39 kg/m. Ideal body weight: 52.4 kg (115 lb 8.3 oz) Adjusted ideal body weight: 72.8 kg (160 lb 8.2 oz)   BMI Readings from Last 4 Encounters:  11/02/17 40.39 kg/m  10/18/17 38.62 kg/m  10/13/17 38.62 kg/m  10/05/17 38.62 kg/m  Wt Readings from Last 4 Encounters:  11/02/17 228 lb (103.4 kg)  10/18/17 218 lb (98.9 kg)  10/13/17 218 lb (98.9 kg)  10/02/17 218 lb (98.9 kg)  Psych/Mental status: Alert, oriented x 3 (person, place, & time)       Eyes: PERLA Respiratory: No evidence of acute respiratory distress  Cervical Spine Area Exam  Skin & Axial Inspection: No masses, redness, edema, swelling, or associated skin lesions Alignment: Symmetrical Functional ROM: Unrestricted ROM      Stability: No instability detected Muscle Tone/Strength: Functionally intact. No obvious neuro-muscular anomalies detected. Sensory (Neurological): Unimpaired Palpation: No palpable anomalies              Upper Extremity (UE) Exam    Side: Right upper extremity  Side: Left upper extremity  Skin & Extremity Inspection: Skin color, temperature, and hair growth are WNL. No peripheral edema or cyanosis. No masses, redness, swelling, asymmetry, or associated skin lesions. No contractures.  Skin & Extremity Inspection: Skin color, temperature, and hair growth are WNL. No peripheral edema or cyanosis. No masses, redness, swelling, asymmetry, or associated skin lesions. No contractures.  Functional ROM: Unrestricted ROM          Functional ROM: Unrestricted ROM          Muscle Tone/Strength: Functionally intact. No obvious neuro-muscular anomalies detected.  Muscle Tone/Strength: Functionally intact. No obvious neuro-muscular anomalies detected.  Sensory (Neurological): Unimpaired          Sensory (Neurological): Unimpaired          Palpation: No palpable anomalies              Palpation: No palpable anomalies              Provocative Test(s):  Phalen's test: deferred Tinel's  test: deferred Apley's scratch test (touch opposite shoulder):  Action 1 (Across chest): deferred Action 2 (Overhead): deferred Action 3 (LB reach): deferred   Provocative Test(s):  Phalen's test: deferred Tinel's test: deferred Apley's scratch test (touch opposite shoulder):  Action 1 (Across chest): deferred Action 2 (Overhead): deferred Action 3 (LB reach): deferred    Thoracic Spine Area Exam  Skin & Axial Inspection: No masses, redness, or swelling Alignment: Symmetrical Functional ROM: Unrestricted ROM Stability: No instability detected Muscle Tone/Strength: Functionally intact. No obvious neuro-muscular anomalies detected. Sensory (Neurological): Unimpaired Muscle strength & Tone: No palpable anomalies  Lumbar Spine Area Exam  Skin & Axial Inspection: No masses, redness, or swelling Alignment: Symmetrical Functional ROM: Unrestricted ROM       Stability: No instability detected Muscle Tone/Strength: Functionally intact. No obvious neuro-muscular anomalies detected. Sensory (Neurological): Unimpaired Palpation: No palpable anomalies       Provocative Tests: Lumbar Hyperextension/rotation test: deferred today       Lumbar quadrant test (Kemp's test): deferred today       Lumbar Lateral bending test: deferred today       Patrick's Maneuver: deferred today                   FABER test: deferred today       Thigh-thrust test: deferred today       S-I compression test: deferred today       S-I distraction test: deferred today        Gait & Posture Assessment  Ambulation: Patient came in today in a wheel chair Gait: Relatively normal for age and body habitus Posture: WNL   Lower Extremity Exam    Side: Right lower extremity  Side: Left lower extremity  Stability: No instability observed          Stability: No instability observed          Skin & Extremity Inspection: Skin color, temperature, and hair growth are WNL. No peripheral edema or cyanosis. No masses, redness,  swelling, asymmetry, or associated skin lesions. No contractures.  Skin & Extremity Inspection: Skin color, temperature, and hair growth are WNL. No peripheral edema or cyanosis. No masses, redness, swelling, asymmetry, or associated skin lesions. No contractures.  Functional ROM: Unrestricted ROM                  Functional ROM: Unrestricted ROM                  Muscle Tone/Strength: Functionally intact. No obvious neuro-muscular anomalies detected.  Muscle Tone/Strength: Functionally intact. No obvious neuro-muscular anomalies detected.  Sensory (Neurological): Unimpaired  Sensory (Neurological): Unimpaired  Palpation: No palpable anomalies  Palpation: No palpable anomalies   Assessment & Plan  Primary Diagnosis & Pertinent Problem List: The primary encounter diagnosis was Chronic pain syndrome. 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  Visit Diagnosis: 1. Chronic pain syndrome   2. 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 Chronic sacroiliac joint pain  (Secondary Area of Pain) (Right)   4. Sacral insufficiency fracture, sequela   5. DDD (degenerative disc disease), lumbar   6. Chronic L4-5 and L5-S1 anterolisthesis.   7. Lumbar foraminal stenosis (L3-4 and L4-5) (Bilateral)   8. Lumbosacral L5-S1 subarticular lateral recess stenosis (Bilateral)   9. Chronic Lumbar facet arthropathy (L3-4, L4-5, and L5-S1) (Bilateral)   10. Lumbar facet syndrome   11. 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 Lumbar pars defect (L3 and L4) (Bilateral)   13. Chronic lower extremity pain (Secondary Area of Pain) (Bilateral)   14. Chronic radicular pain of lower extremity   15. Chronic hip pain (Left)   16. Osteoarthritis of hip (Right)   17. Diabetic peripheral neuropathy ([place])   18. Pharmacologic therapy   19. Disorder of skeletal system   20. Problems influencing health status   21. Hyponatremia   22. Hypocalcemia    Problems updated and reviewed during this visit: No problems updated.  Plan of Care  Pharmacotherapy (Medications Ordered): No orders of the defined types were placed in this encounter.  Procedure Orders     Caudal Epidural Injection  Lab Orders     Magnesium     Vitamin B12     Sedimentation rate     25-Hydroxyvitamin D Lcms D2+D3     C-reactive protein  Imaging Orders     DG Lumbar Spine Complete W/Bend Referral Orders  No referral(s) requested today   Pharmacological management options:  Opioid Analgesics: We'll take over management today. See above orders Membrane stabilizer: We have discussed the possibility of optimizing this mode of therapy, if tolerated Muscle relaxant: We have discussed the possibility of a trial NSAID: We have discussed the possibility of a trial Other analgesic(s): To be determined at a later time   Interventional management options: Planned, scheduled, and/or pending:    Diagnostic caudal ESI #1 under fluoro and IV sedation   Considering:   Diagnostic bilateral  lumbar facet block  Possible bilateral lumbar facet RFA  Diagnostic L5-S1 LESI  Diagnostic bilateral L3 transforaminal ESI  Diagnostic bilateral L4 transforaminal ESI  Diagnostic bilateral L5 transforaminal ESI  Diagnostic right-sided sacroiliac joint block  Possible right-sided sacroiliac joint RFA  Diagnostic bilateral lumbar facet block  Possible bilateral lumbar facet RFA  Diagnostic right-sided intra-articular hip joint injection  Diagnostic right-sided femoral nerve + Obturator nerve block  Possible right-sided femoral nerve + obturator RFA    PRN Procedures:   None at this time   Provider-requested follow-up: Return for Procedure (w/ sedation): (ML) Caudal ESI #1.  Future Appointments  Date Time Provider [place]  11/10/2017  8:15 AM Milinda Pointer, MD [name] None  11/17/2017  2:30 PM Pabon, Marjory Lies, MD [facility] None    Primary Care Physician: Tracie Harrier, MD Location: [facility] Note by: Gaspar Cola, MD Date: 11/02/2017; Time: 10:58 AM

## 2017-11-02 ENCOUNTER — Other Ambulatory Visit: Payer: Self-pay

## 2017-11-02 ENCOUNTER — Ambulatory Visit: Payer: PPO | Attending: Pain Medicine | Admitting: Pain Medicine

## 2017-11-02 ENCOUNTER — Encounter: Payer: Self-pay | Admitting: Pain Medicine

## 2017-11-02 VITALS — BP 126/71 | HR 84 | Temp 97.8°F | Ht 63.0 in | Wt 228.0 lb

## 2017-11-02 DIAGNOSIS — M4300 Spondylolysis, site unspecified: Secondary | ICD-10-CM

## 2017-11-02 DIAGNOSIS — Z6841 Body Mass Index (BMI) 40.0 and over, adult: Secondary | ICD-10-CM | POA: Insufficient documentation

## 2017-11-02 DIAGNOSIS — M79605 Pain in left leg: Secondary | ICD-10-CM

## 2017-11-02 DIAGNOSIS — M4310 Spondylolisthesis, site unspecified: Secondary | ICD-10-CM

## 2017-11-02 DIAGNOSIS — M79604 Pain in right leg: Secondary | ICD-10-CM | POA: Diagnosis not present

## 2017-11-02 DIAGNOSIS — M4306 Spondylolysis, lumbar region: Secondary | ICD-10-CM

## 2017-11-02 DIAGNOSIS — M25552 Pain in left hip: Secondary | ICD-10-CM | POA: Diagnosis not present

## 2017-11-02 DIAGNOSIS — M1711 Unilateral primary osteoarthritis, right knee: Secondary | ICD-10-CM | POA: Diagnosis not present

## 2017-11-02 DIAGNOSIS — M533 Sacrococcygeal disorders, not elsewhere classified: Secondary | ICD-10-CM

## 2017-11-02 DIAGNOSIS — M4807 Spinal stenosis, lumbosacral region: Secondary | ICD-10-CM | POA: Diagnosis not present

## 2017-11-02 DIAGNOSIS — Z79891 Long term (current) use of opiate analgesic: Secondary | ICD-10-CM | POA: Diagnosis not present

## 2017-11-02 DIAGNOSIS — E1142 Type 2 diabetes mellitus with diabetic polyneuropathy: Secondary | ICD-10-CM

## 2017-11-02 DIAGNOSIS — M5136 Other intervertebral disc degeneration, lumbar region: Secondary | ICD-10-CM

## 2017-11-02 DIAGNOSIS — M47816 Spondylosis without myelopathy or radiculopathy, lumbar region: Secondary | ICD-10-CM | POA: Insufficient documentation

## 2017-11-02 DIAGNOSIS — M5116 Intervertebral disc disorders with radiculopathy, lumbar region: Secondary | ICD-10-CM | POA: Diagnosis not present

## 2017-11-02 DIAGNOSIS — Z79899 Other long term (current) drug therapy: Secondary | ICD-10-CM | POA: Diagnosis not present

## 2017-11-02 DIAGNOSIS — G894 Chronic pain syndrome: Secondary | ICD-10-CM | POA: Diagnosis not present

## 2017-11-02 DIAGNOSIS — M4316 Spondylolisthesis, lumbar region: Secondary | ICD-10-CM | POA: Insufficient documentation

## 2017-11-02 DIAGNOSIS — Z7982 Long term (current) use of aspirin: Secondary | ICD-10-CM | POA: Diagnosis not present

## 2017-11-02 DIAGNOSIS — Z7984 Long term (current) use of oral hypoglycemic drugs: Secondary | ICD-10-CM | POA: Diagnosis not present

## 2017-11-02 DIAGNOSIS — E871 Hypo-osmolality and hyponatremia: Secondary | ICD-10-CM | POA: Diagnosis not present

## 2017-11-02 DIAGNOSIS — M9983 Other biomechanical lesions of lumbar region: Secondary | ICD-10-CM | POA: Diagnosis not present

## 2017-11-02 DIAGNOSIS — M5441 Lumbago with sciatica, right side: Secondary | ICD-10-CM

## 2017-11-02 DIAGNOSIS — E1140 Type 2 diabetes mellitus with diabetic neuropathy, unspecified: Secondary | ICD-10-CM | POA: Insufficient documentation

## 2017-11-02 DIAGNOSIS — Z789 Other specified health status: Secondary | ICD-10-CM

## 2017-11-02 DIAGNOSIS — M5442 Lumbago with sciatica, left side: Secondary | ICD-10-CM

## 2017-11-02 DIAGNOSIS — M1611 Unilateral primary osteoarthritis, right hip: Secondary | ICD-10-CM | POA: Insufficient documentation

## 2017-11-02 DIAGNOSIS — M8448XS Pathological fracture, other site, sequela: Secondary | ICD-10-CM | POA: Diagnosis not present

## 2017-11-02 DIAGNOSIS — M899 Disorder of bone, unspecified: Secondary | ICD-10-CM | POA: Diagnosis not present

## 2017-11-02 DIAGNOSIS — M5410 Radiculopathy, site unspecified: Secondary | ICD-10-CM

## 2017-11-02 DIAGNOSIS — G8929 Other chronic pain: Secondary | ICD-10-CM

## 2017-11-02 DIAGNOSIS — M51369 Other intervertebral disc degeneration, lumbar region without mention of lumbar back pain or lower extremity pain: Secondary | ICD-10-CM

## 2017-11-02 DIAGNOSIS — M48061 Spinal stenosis, lumbar region without neurogenic claudication: Secondary | ICD-10-CM | POA: Diagnosis not present

## 2017-11-02 NOTE — Patient Instructions (Signed)
____________________________________________________________________________________________  Pain Scale  Introduction: The pain score used by this practice is the Verbal Numerical Rating Scale (VNRS-11). This is an 11-point scale. It is for adults and children 10 years or older. There are significant differences in how the pain score is reported, used, and applied. Forget everything you learned in the past and learn this scoring system.  General Information: The scale should reflect your current level of pain. Unless you are specifically asked for the level of your worst pain, or your average pain. If you are asked for one of these two, then it should be understood that it is over the past 24 hours.  Basic Activities of Daily Living (ADL): Personal hygiene, dressing, eating, transferring, and using restroom.  Instructions: Most patients tend to report their level of pain as a combination of two factors, their physical pain and their psychosocial pain. This last one is also known as "suffering" and it is reflection of how physical pain affects you socially and psychologically. From now on, report them separately. From this point on, when asked to report your pain level, report only your physical pain. Use the following table for reference.  Pain Clinic Pain Levels (0-5/10)  Pain Level Score  Description  No Pain 0   Mild pain 1 Nagging, annoying, but does not interfere with basic activities of daily living (ADL). Patients are able to eat, bathe, get dressed, toileting (being able to get on and off the toilet and perform personal hygiene functions), transfer (move in and out of bed or a chair without assistance), and maintain continence (able to control bladder and bowel functions). Blood pressure and heart rate are unaffected. A normal heart rate for a healthy adult ranges from 60 to 100 bpm (beats per minute).   Mild to moderate pain 2 Noticeable and distracting. Impossible to hide from other  people. More frequent flare-ups. Still possible to adapt and function close to normal. It can be very annoying and may have occasional stronger flare-ups. With discipline, patients may get used to it and adapt.   Moderate pain 3 Interferes significantly with activities of daily living (ADL). It becomes difficult to feed, bathe, get dressed, get on and off the toilet or to perform personal hygiene functions. Difficult to get in and out of bed or a chair without assistance. Very distracting. With effort, it can be ignored when deeply involved in activities.   Moderately severe pain 4 Impossible to ignore for more than a few minutes. With effort, patients may still be able to manage work or participate in some social activities. Very difficult to concentrate. Signs of autonomic nervous system discharge are evident: dilated pupils (mydriasis); mild sweating (diaphoresis); sleep interference. Heart rate becomes elevated (>115 bpm). Diastolic blood pressure (lower number) rises above 100 mmHg. Patients find relief in laying down and not moving.   Severe pain 5 Intense and extremely unpleasant. Associated with frowning face and frequent crying. Pain overwhelms the senses.  Ability to do any activity or maintain social relationships becomes significantly limited. Conversation becomes difficult. Pacing back and forth is common, as getting into a comfortable position is nearly impossible. Pain wakes you up from deep sleep. Physical signs will be obvious: pupillary dilation; increased sweating; goosebumps; brisk reflexes; cold, clammy hands and feet; nausea, vomiting or dry heaves; loss of appetite; significant sleep disturbance with inability to fall asleep or to remain asleep. When persistent, significant weight loss is observed due to the complete loss of appetite and sleep deprivation.  Blood   pressure and heart rate becomes significantly elevated. Caution: If elevated blood pressure triggers a pounding headache  associated with blurred vision, then the patient should immediately seek attention at an urgent or emergency care unit, as these may be signs of an impending stroke.    Emergency Department Pain Levels (6-10/10)  Emergency Room Pain 6 Severely limiting. Requires emergency care and should not be seen or managed at an outpatient pain management facility. Communication becomes difficult and requires great effort. Assistance to reach the emergency department may be required. Facial flushing and profuse sweating along with potentially dangerous increases in heart rate and blood pressure will be evident.   Distressing pain 7 Self-care is very difficult. Assistance is required to transport, or use restroom. Assistance to reach the emergency department will be required. Tasks requiring coordination, such as bathing and getting dressed become very difficult.   Disabling pain 8 Self-care is no longer possible. At this level, pain is disabling. The individual is unable to do even the most "basic" activities such as walking, eating, bathing, dressing, transferring to a bed, or toileting. Fine motor skills are lost. It is difficult to think clearly.   Incapacitating pain 9 Pain becomes incapacitating. Thought processing is no longer possible. Difficult to remember your own name. Control of movement and coordination are lost.   The worst pain imaginable 10 At this level, most patients pass out from pain. When this level is reached, collapse of the autonomic nervous system occurs, leading to a sudden drop in blood pressure and heart rate. This in turn results in a temporary and dramatic drop in blood flow to the brain, leading to a loss of consciousness. Fainting is one of the body's self defense mechanisms. Passing out puts the brain in a calmed state and causes it to shut down for a while, in order to begin the healing process.    Summary: 1. Refer to this scale when providing [place] with your pain level. 2. Be  accurate and careful when reporting your pain level. This will help with your care. 3. Over-reporting your pain level will lead to loss of credibility. 4. Even a level of 1/10 means that there is pain and will be treated at our facility. 5. High, inaccurate reporting will be documented as "Symptom Exaggeration", leading to loss of credibility and suspicions of possible secondary gains such as obtaining more narcotics, or wanting to appear disabled, for fraudulent reasons. 6. Only pain levels of 5 or below will be seen at our facility. 7. Pain levels of 6 and above will be sent to the Emergency Department and the appointment cancelled. ____________________________________________________________________________________________   ____________________________________________________________________________________________  Preparing for Procedure with Sedation  Instructions: . Oral Intake: Do not eat or drink anything for at least 8 hours prior to your procedure. . Transportation: Public transportation is not allowed. Bring an adult driver. The driver must be physically present in our waiting room before any procedure can be started. Marland Kitchen Physical Assistance: Bring an adult physically capable of assisting you, in the event you need help. This adult should keep you company at home for at least 6 hours after the procedure. . Blood Pressure Medicine: Take your blood pressure medicine with a sip of water the morning of the procedure. . Blood thinners:  . Diabetics on insulin: Notify the staff so that you can be scheduled 1st case in the morning. If your diabetes requires high dose insulin, take only  of your normal insulin dose the morning of the procedure and  notify the staff that you have done so. . Preventing infections: Shower with an antibacterial soap the morning of your procedure. . Build-up your immune system: Take 1000 mg of Vitamin C with every meal (3 times a day) the day prior to your  procedure. Marland Kitchen Antibiotics: Inform the staff if you have a condition or reason that requires you to take antibiotics before dental procedures. . Pregnancy: If you are pregnant, call and cancel the procedure. . Sickness: If you have a cold, fever, or any active infections, call and cancel the procedure. . Arrival: You must be in the facility at least 30 minutes prior to your scheduled procedure. . Children: Do not bring children with you. . Dress appropriately: Bring dark clothing that you would not mind if they get stained. . Valuables: Do not bring any jewelry or valuables.  Procedure appointments are reserved for interventional treatments only. Marland Kitchen No Prescription Refills. . No medication changes will be discussed during procedure appointments. . No disability issues will be discussed.  Remember:  Regular Business hours are:  Monday to Thursday 8:00 AM to 4:00 PM  Provider's Schedule: Milinda Pointer, MD:  Procedure days: Tuesday and Thursday 7:30 AM to 4:00 PM  Gillis Santa, MD:  Procedure days: Monday and Wednesday 7:30 AM to 4:00 PM ____________________________________________________________________________________________

## 2017-11-03 ENCOUNTER — Ambulatory Visit: Payer: Self-pay | Admitting: Surgery

## 2017-11-04 DIAGNOSIS — S32121A Minimally displaced Zone II fracture of sacrum, initial encounter for closed fracture: Secondary | ICD-10-CM | POA: Diagnosis not present

## 2017-11-06 LAB — 25-HYDROXYVITAMIN D LCMS D2+D3: 25-HYDROXY, VITAMIN D-3: 25 ng/mL

## 2017-11-06 LAB — C-REACTIVE PROTEIN: CRP: 5.3 mg/L — AB (ref 0.0–4.9)

## 2017-11-06 LAB — SEDIMENTATION RATE: SED RATE: 12 mm/h (ref 0–40)

## 2017-11-06 LAB — 25-HYDROXY VITAMIN D LCMS D2+D3
25-Hydroxy, Vitamin D-2: <1 ng/mL
25-Hydroxy, Vitamin D: 25 ng/mL — ABNORMAL LOW

## 2017-11-06 LAB — MAGNESIUM: MAGNESIUM: 2 mg/dL (ref 1.6–2.3)

## 2017-11-06 LAB — VITAMIN B12: Vitamin B-12: 367 pg/mL (ref 232–1245)

## 2017-11-08 ENCOUNTER — Ambulatory Visit: Payer: PPO | Admitting: Anesthesiology

## 2017-11-08 ENCOUNTER — Encounter
Admission: RE | Disposition: A | Discharge status: Home / Self Care | Payer: Self-pay | Source: Ambulatory Visit | Attending: Orthopedic Surgery

## 2017-11-08 ENCOUNTER — Ambulatory Visit
Admission: RE | Admit: 2017-11-08 | Discharge: 2017-11-08 | Disposition: A | Discharge status: Home / Self Care | Payer: PPO | Source: Ambulatory Visit | Attending: Orthopedic Surgery | Admitting: Orthopedic Surgery

## 2017-11-08 ENCOUNTER — Ambulatory Visit: Payer: PPO

## 2017-11-08 DIAGNOSIS — Y990 Civilian activity done for income or pay: Secondary | ICD-10-CM | POA: Insufficient documentation

## 2017-11-08 DIAGNOSIS — I110 Hypertensive heart disease with heart failure: Secondary | ICD-10-CM | POA: Diagnosis not present

## 2017-11-08 DIAGNOSIS — Z885 Allergy status to narcotic agent status: Secondary | ICD-10-CM | POA: Insufficient documentation

## 2017-11-08 DIAGNOSIS — Z86711 Personal history of pulmonary embolism: Secondary | ICD-10-CM | POA: Insufficient documentation

## 2017-11-08 DIAGNOSIS — Z91040 Latex allergy status: Secondary | ICD-10-CM | POA: Diagnosis not present

## 2017-11-08 DIAGNOSIS — Z7982 Long term (current) use of aspirin: Secondary | ICD-10-CM | POA: Insufficient documentation

## 2017-11-08 DIAGNOSIS — Z419 Encounter for procedure for purposes other than remedying health state, unspecified: Secondary | ICD-10-CM

## 2017-11-08 DIAGNOSIS — E1140 Type 2 diabetes mellitus with diabetic neuropathy, unspecified: Secondary | ICD-10-CM | POA: Diagnosis not present

## 2017-11-08 DIAGNOSIS — S32121A Minimally displaced Zone II fracture of sacrum, initial encounter for closed fracture: Secondary | ICD-10-CM | POA: Diagnosis not present

## 2017-11-08 DIAGNOSIS — Z79899 Other long term (current) drug therapy: Secondary | ICD-10-CM | POA: Diagnosis not present

## 2017-11-08 DIAGNOSIS — Z7984 Long term (current) use of oral hypoglycemic drugs: Secondary | ICD-10-CM | POA: Diagnosis not present

## 2017-11-08 DIAGNOSIS — E119 Type 2 diabetes mellitus without complications: Secondary | ICD-10-CM | POA: Insufficient documentation

## 2017-11-08 DIAGNOSIS — I509 Heart failure, unspecified: Secondary | ICD-10-CM | POA: Insufficient documentation

## 2017-11-08 DIAGNOSIS — Y92009 Unspecified place in unspecified non-institutional (private) residence as the place of occurrence of the external cause: Secondary | ICD-10-CM | POA: Diagnosis not present

## 2017-11-08 DIAGNOSIS — Z888 Allergy status to other drugs, medicaments and biological substances status: Secondary | ICD-10-CM | POA: Diagnosis not present

## 2017-11-08 DIAGNOSIS — X500XXA Overexertion from strenuous movement or load, initial encounter: Secondary | ICD-10-CM | POA: Diagnosis not present

## 2017-11-08 DIAGNOSIS — Z6841 Body Mass Index (BMI) 40.0 and over, adult: Secondary | ICD-10-CM | POA: Diagnosis not present

## 2017-11-08 DIAGNOSIS — M1990 Unspecified osteoarthritis, unspecified site: Secondary | ICD-10-CM | POA: Insufficient documentation

## 2017-11-08 DIAGNOSIS — Y93F9 Activity, other caregiving: Secondary | ICD-10-CM | POA: Insufficient documentation

## 2017-11-08 DIAGNOSIS — I5023 Acute on chronic systolic (congestive) heart failure: Secondary | ICD-10-CM | POA: Diagnosis not present

## 2017-11-08 DIAGNOSIS — Z981 Arthrodesis status: Secondary | ICD-10-CM | POA: Diagnosis not present

## 2017-11-08 HISTORY — PX: SACROPLASTY: SHX6797

## 2017-11-08 LAB — GLUCOSE, CAPILLARY
GLUCOSE-CAPILLARY: 162 mg/dL — AB (ref 65–99)
Glucose-Capillary: 137 mg/dL — ABNORMAL HIGH (ref 65–99)

## 2017-11-08 SURGERY — SACROPLASTY
Anesthesia: General | Site: Back | Wound class: "Clean "

## 2017-11-08 MED ORDER — ONDANSETRON HCL 4 MG/2ML IJ SOLN: 4.0000 mg | Freq: Once | INTRAMUSCULAR | Status: DC | PRN

## 2017-11-08 MED ORDER — PROPOFOL 10 MG/ML IV BOLUS
INTRAVENOUS | Status: AC
  Filled 2017-11-08: qty 20

## 2017-11-08 MED ORDER — MIDAZOLAM HCL 2 MG/2ML IJ SOLN
INTRAMUSCULAR | Status: AC
  Filled 2017-11-08: qty 2

## 2017-11-08 MED ORDER — FENTANYL CITRATE (PF) 100 MCG/2ML IJ SOLN: 25.0000 ug | INTRAMUSCULAR | Status: DC | PRN

## 2017-11-08 MED ORDER — BUPIVACAINE-EPINEPHRINE (PF) 0.5% -1:200000 IJ SOLN
INTRAMUSCULAR | Status: DC | PRN
  Administered 2017-11-08: 20 mL

## 2017-11-08 MED ORDER — HYDROCODONE-ACETAMINOPHEN 5-325 MG PO TABS: 1.0000 | ORAL_TABLET | Freq: Four times a day (QID) | ORAL | 0 refills | Status: DC | PRN

## 2017-11-08 MED ORDER — LIDOCAINE HCL (PF) 2 % IJ SOLN
INTRAMUSCULAR | Status: AC
  Filled 2017-11-08: qty 10

## 2017-11-08 MED ORDER — BUPIVACAINE-EPINEPHRINE (PF) 0.5% -1:200000 IJ SOLN
INTRAMUSCULAR | Status: AC
  Filled 2017-11-08: qty 30

## 2017-11-08 MED ORDER — CEFAZOLIN SODIUM-DEXTROSE 2-4 GM/100ML-% IV SOLN
2.0000 g | Freq: Once | INTRAVENOUS | Status: AC
  Administered 2017-11-08: 2 g via INTRAVENOUS

## 2017-11-08 MED ORDER — CEFAZOLIN SODIUM-DEXTROSE 2-4 GM/100ML-% IV SOLN
INTRAVENOUS | Status: AC
  Filled 2017-11-08: qty 100

## 2017-11-08 MED ORDER — LIDOCAINE HCL (PF) 1 % IJ SOLN
INTRAMUSCULAR | Status: DC | PRN
  Administered 2017-11-08: 5 mL

## 2017-11-08 MED ORDER — PROPOFOL 10 MG/ML IV BOLUS
INTRAVENOUS | Status: AC
  Filled 2017-11-08: qty 40

## 2017-11-08 MED ORDER — ONDANSETRON HCL 4 MG/2ML IJ SOLN
INTRAMUSCULAR | Status: DC | PRN
  Administered 2017-11-08: 4 mg via INTRAVENOUS

## 2017-11-08 MED ORDER — MIDAZOLAM HCL 2 MG/2ML IJ SOLN
INTRAMUSCULAR | Status: DC | PRN
  Administered 2017-11-08: 2 mg via INTRAVENOUS

## 2017-11-08 MED ORDER — KETAMINE HCL 50 MG/ML IJ SOLN
INTRAMUSCULAR | Status: DC | PRN
  Administered 2017-11-08 (×3): 50 mg via INTRAVENOUS

## 2017-11-08 MED ORDER — PROPOFOL 500 MG/50ML IV EMUL
INTRAVENOUS | Status: DC | PRN
  Administered 2017-11-08: 25 ug/kg/min via INTRAVENOUS

## 2017-11-08 MED ORDER — SODIUM CHLORIDE 0.9 % IV SOLN
INTRAVENOUS | Status: DC
  Administered 2017-11-08: 14:00:00 via INTRAVENOUS

## 2017-11-08 MED ORDER — LIDOCAINE HCL (PF) 1 % IJ SOLN
INTRAMUSCULAR | Status: AC
  Filled 2017-11-08: qty 30

## 2017-11-08 MED ORDER — FENTANYL CITRATE (PF) 100 MCG/2ML IJ SOLN
INTRAMUSCULAR | Status: AC
  Filled 2017-11-08: qty 2

## 2017-11-08 MED ORDER — KETAMINE HCL 50 MG/ML IJ SOLN
INTRAMUSCULAR | Status: AC
  Filled 2017-11-08: qty 10

## 2017-11-08 SURGICAL SUPPLY — 19 items
BIT DRILL KYPHON EXPRESS (MISCELLANEOUS) ×2 IMPLANT
CEMENT KYPHON CX01A KIT/MIXER (Cement) ×3 IMPLANT
DERMABOND ADVANCED (GAUZE/BANDAGES/DRESSINGS) ×2
DERMABOND ADVANCED .7 DNX12 (GAUZE/BANDAGES/DRESSINGS) ×1 IMPLANT
DEVICE BIOPSY BONE KYPH (INSTRUMENTS) ×6 IMPLANT
DRAPE C-ARM XRAY 36X54 (DRAPES) ×3 IMPLANT
DRILL KYPHON EXPRESS (MISCELLANEOUS) ×6
DURAPREP 26ML APPLICATOR (WOUND CARE) ×3 IMPLANT
GLOVE SURG SYN 9.0  PF PI (GLOVE) ×2
GLOVE SURG SYN 9.0 PF PI (GLOVE) ×1 IMPLANT
GOWN SRG 2XL LVL 4 RGLN SLV (GOWNS) ×1 IMPLANT
GOWN STRL NON-REIN 2XL LVL4 (GOWNS) ×2
GOWN STRL REUS W/ TWL LRG LVL3 (GOWN DISPOSABLE) ×1 IMPLANT
GOWN STRL REUS W/TWL LRG LVL3 (GOWN DISPOSABLE) ×2
KIT TURNOVER KIT A (KITS) ×3 IMPLANT
PACK KYPHOPLASTY (MISCELLANEOUS) ×3 IMPLANT
SYSTEM GUN BONE FILLER SZ2 (Cement) ×2 IMPLANT
cement cartridges ×1 IMPLANT
cement delivery gun and bone filler device ×2 IMPLANT

## 2017-11-08 NOTE — Transfer of Care (Signed)
Immediate Anesthesia Transfer of Care Note  Patient: Kara [name]  Procedure(s) Performed: SACROPLASTY S2 (N/A Back)  Patient Location: PACU  Anesthesia Type:MAC  Level of Consciousness: awake, alert  and oriented  Airway & Oxygen Therapy: Patient connected to nasal cannula oxygen  Post-op Assessment: Post -op Vital signs reviewed and stable  Post vital signs: stable  Last Vitals:  Vitals Value Taken Time  BP 132/76 11/08/2017  4:16 PM  Temp    Pulse 94 11/08/2017  4:16 PM  Resp 14 11/08/2017  4:16 PM  SpO2 96 % 11/08/2017  4:16 PM    Last Pain:  Vitals:   11/08/17 1402  TempSrc: Tympanic  PainSc: 10-Worst pain ever         Complications: No apparent anesthesia complications

## 2017-11-08 NOTE — Anesthesia Post-op Follow-up Note (Signed)
Anesthesia QCDR form completed.        

## 2017-11-08 NOTE — Op Note (Signed)
11/08/2017  4:21 PM  PATIENT:  Kara [name]  67 y.o. female  PRE-OPERATIVE DIAGNOSIS:  CLOSED MINIMALLY DISPLACED ZONE 2 FX OF SACRUM  POST-OPERATIVE DIAGNOSIS:  CLOSED MINIMALLY DISPLACED ZONE 2 FX OF SACRUM  PROCEDURE:  Procedure(s): SACROPLASTY S2 (N/A)  SURGEON: Laurene Footman, MD  ASSISTANTS: None  ANESTHESIA:   local and MAC  EBL:  No intake/output data recorded.  BLOOD ADMINISTERED:none  DRAINS: none   LOCAL MEDICATIONS USED:  MARCAINE    and LIDOCAINE   SPECIMEN:  No Specimen  DISPOSITION OF SPECIMEN:  N/A  COUNTS:  YES  TOURNIQUET:  * No tourniquets in log *  IMPLANTS: Bone cement  DICTATION: .Dragon Dictation patient was brought to the operating room and after adequate anesthesia was obtained the patient was placed prone.  C arm was brought in in good visualization of the sacrum was obtained.  After patient identification and timeout procedures were completed local anesthetic was infiltrated the subcutaneous tissue near the planned incisions 5 cc on each side with 1% Xylocaine.  The back was then prepped and draped in usual sterile fashion and repeat timeout procedure carried out.  A spinal needle was then used to get local anesthetic down to the sacrum on both sides with a 50-50 mix of half percent Sensorcaine with epinephrine and 1% Xylocaine 20 cc on each side.  After allowing this to set a small incision was made and trocar advanced between the neuroforamen and the SI joint to the level of S2 which could be well visualized.  This is done on both sides and when both trochars were set cement was mixed and then injected in with the delivery system getting good fill and S2 with interdigitation at the level of the fracture on both sides without extravasation into the SI joint or neuroforamen.  After adequate cement Hayden Pedro had been obtained trochars were backed off to the cement was set and then they removed with no extravasation through the entry hole either.   The permanent views were then obtained in both AP and lateral projections and the wound was closed with Dermabond followed by Band-Aid  PLAN OF CARE: Discharge to home after PACU  PATIENT DISPOSITION:  PACU - hemodynamically stable.

## 2017-11-08 NOTE — Anesthesia Postprocedure Evaluation (Signed)
Anesthesia Post Note  Patient: Kara [name]  Procedure(s) Performed: SACROPLASTY S2 (N/A Back)  Patient location during evaluation: PACU Anesthesia Type: General Level of consciousness: awake and alert Pain management: pain level controlled Vital Signs Assessment: post-procedure vital signs reviewed and stable Respiratory status: spontaneous breathing, nonlabored ventilation, respiratory function stable and patient connected to nasal cannula oxygen Cardiovascular status: blood pressure returned to baseline and stable Postop Assessment: no apparent nausea or vomiting Anesthetic complications: no     Last Vitals:  Vitals:   11/08/17 1701 11/08/17 1713  BP: (!) 153/92 (!) 151/80  Pulse: 86 86  Resp: 20 18  Temp: (!) 36.3 C   SpO2: 97% 95%    Last Pain:  Vitals:   11/08/17 1713  TempSrc:   PainSc: 7                  [name] S

## 2017-11-08 NOTE — Discharge Instructions (Addendum)
Take it easy today and tomorrow.  Remove Band-Aids Thursday okay to shower then.  Resume more normal activities on Thursday.  Pain medicine as directed  AMBULATORY SURGERY  DISCHARGE INSTRUCTIONS   1) The drugs that you were given will stay in your system until tomorrow so for the next 24 hours you should not:  A) Drive an automobile B) Make any legal decisions C) Drink any alcoholic beverage   2) You may resume regular meals tomorrow.  Today it is better to start with liquids and gradually work up to solid foods.  You may eat anything you prefer, but it is better to start with liquids, then soup and crackers, and gradually work up to solid foods.   3) Please notify your doctor immediately if you have any unusual bleeding, trouble breathing, redness and pain at the surgery site, drainage, fever, or pain not relieved by medication.    4) Additional Instructions: TAKE A STOOL SOFTENER TWICE A DAY WHILE TAKING NARCOTIC PAIN MEDICINE TO PREVENT CONSTIPATION   Please contact your physician with any problems or Same Day Surgery at [phone], Monday through Friday 6 am to 4 pm, or [place] at [facility] number at [phone].

## 2017-11-08 NOTE — Anesthesia Preprocedure Evaluation (Addendum)
Anesthesia Evaluation  Patient identified by MRN, date of birth, ID band Patient awake    Reviewed: Allergy & Precautions, NPO status , Patient's Chart, lab work & pertinent test results, reviewed documented beta blocker date and time   Airway Mallampati: III  TM Distance: >3 FB     Dental  (+) Chipped, Partial Upper   Pulmonary pneumonia, resolved,           Cardiovascular hypertension, Pt. on medications + angina +CHF       Neuro/Psych  Neuromuscular disease    GI/Hepatic   Endo/Other  diabetes, Type 2Morbid obesity  Renal/GU      Musculoskeletal  (+) Arthritis ,   Abdominal   Peds  Hematology   Anesthesia Other Findings Hx of PE. IVC removed.  Reproductive/Obstetrics                            Anesthesia Physical Anesthesia Plan  ASA: III  Anesthesia Plan: MAC   Post-op Pain Management:    Induction:   PONV Risk Score and Plan:   Airway Management Planned:   Additional Equipment:   Intra-op Plan:   Post-operative Plan:   Informed Consent: I have reviewed the patients History and Physical, chart, labs and discussed the procedure including the risks, benefits and alternatives for the proposed anesthesia with the patient or authorized representative who has indicated his/her understanding and acceptance.     Plan Discussed with: CRNA  Anesthesia Plan Comments:        Anesthesia Quick Evaluation

## 2017-11-08 NOTE — H&P (Signed)
Reviewed paper H+P, will be scanned into chart. No changes noted.  

## 2017-11-09 ENCOUNTER — Encounter: Payer: Self-pay | Admitting: Orthopedic Surgery

## 2017-11-09 DIAGNOSIS — Z91040 Latex allergy status: Secondary | ICD-10-CM | POA: Insufficient documentation

## 2017-11-09 NOTE — Progress Notes (Signed)
Patient's Name: Kara [name]  MRN: [id number]  Referring Provider: Milinda Pointer, MD  DOB: 1950-07-05  PCP: Tracie Harrier, MD  DOS: 11/10/2017  Note by: Gaspar Cola, MD  Service setting: Ambulatory outpatient  Specialty: Interventional Pain Management  Patient type: Established  Location: [name] (AMB) Pain Management Facility  Visit type: Interventional Procedure   Primary Reason for Visit: Interventional Pain Management Treatment. CC: Back Pain (lower lumbar)  Procedure:          Anesthesia, Analgesia, Anxiolysis:  Type: Therapeutic Inter-Laminar Epidural Steroid Injection #1  Region: Lumbar Level: L4-5 Level. Laterality: Left-Sided Paramedial  Type: Moderate (Conscious) Sedation combined with Local Anesthesia Indication(s): Analgesia and Anxiety Route: Intravenous (IV) IV Access: Secured Sedation: Meaningful verbal contact was maintained at all times during the procedure  Local Anesthetic: Lidocaine 1-2%   Indications: 1. DDD (degenerative disc disease), lumbar   2. Chronic radicular pain of lower extremity   3. Chronic lower extremity pain (Secondary Area of Pain) (Bilateral)   4. 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 History of allergy to latex    Pain Score: Pre-procedure: 8 /10 Post-procedure: 0-No pain/10  Pre-op Assessment:  Kara [name] is a 67 y.o. (year old), female patient, seen today for interventional treatment. She  has a past surgical history that includes Knee surgery; Carpal tunnel release (Bilateral); Colon resection sigmoid (N/A, 11/02/2016); Colostomy (N/A, 11/02/2016); Sigmoidoscopy (N/A, 11/13/2016); PULMONARY VENOGRAPHY (N/A, 11/19/2016); Colonoscopy with propofol (N/A, 02/03/2017); Colon surgery (11/02/2016); IVC FILTER INSERTION (Right, 10/2016); Colostomy reversal (N/A, 02/24/2017); Appendectomy (N/A, 02/24/2017); Lysis of adhesion (N/A, 02/24/2017); laparoscopy (N/A, 02/24/2017); Ileo  loop diversion (N/A, 02/24/2017); Sigmoidoscopy (N/A, 05/19/2017); Ileostomy closure (N/A, 06/01/2017); IVC FILTER REMOVAL (N/A, 08/09/2017); and Sacroplasty (N/A, 11/08/2017). Kara [name] has a current medication list which includes the following prescription(s): aspirin ec, candesartan, celecoxib, cyclobenzaprine, gabapentin, glipizide, hydrocodone-acetaminophen, and multi-vitamins, and the following Facility-Administered Medications: midazolam. Her primarily concern today is the Back Pain (lower lumbar)  Initial Vital Signs:  Pulse/HCG Rate: 88ECG Heart Rate: 90 Temp: 97.8 F (36.6 C) Resp: 16 BP: 139/72 SpO2: 94 %  BMI: Estimated body mass index is 38.62 kg/m as calculated from the following:   Height as of this encounter: 5\' 3"  (1.6 m).   Weight as of this encounter: 218 lb (98.9 kg).  Risk Assessment: Allergies: Reviewed. She is allergic to adhesive [tape]; morphine; other; latex; and morphine and related.  Allergy Precautions: Latex-free protocol activated. Fentanyl avoided. Coagulopathies: Reviewed. None identified.  Blood-thinner therapy: None at this time Active Infection(s): Reviewed. None identified. Kara [name] is afebrile  Site Confirmation: Kara [name] was asked to confirm the procedure and laterality before marking the site Procedure checklist: Completed Consent: Before the procedure and under the influence of no sedative(s), amnesic(s), or anxiolytics, the patient was informed of the treatment options, risks and possible complications. To fulfill our ethical and legal obligations, as recommended by the American Medical Association's Code of Ethics, I have informed the patient of my clinical impression; the nature and purpose of the treatment or procedure; the risks, benefits, and possible complications of the intervention; the alternatives, including doing nothing; the risk(s) and benefit(s) of the alternative treatment(s) or procedure(s); and the  risk(s) and benefit(s) of doing nothing. The patient was provided information about the general risks and possible complications associated with the procedure. These may include, but are not limited to: failure to achieve desired goals, infection, bleeding, organ or nerve damage, allergic reactions, paralysis, and death.  In addition, the patient was informed of those risks and complications associated to Spine-related procedures, such as failure to decrease pain; infection (i.e.: Meningitis, epidural or intraspinal abscess); bleeding (i.e.: epidural hematoma, subarachnoid hemorrhage, or any other type of intraspinal or peri-dural bleeding); organ or nerve damage (i.e.: Any type of peripheral nerve, nerve root, or spinal cord injury) with subsequent damage to sensory, motor, and/or autonomic systems, resulting in permanent pain, numbness, and/or weakness of one or several areas of the body; allergic reactions; (i.e.: anaphylactic reaction); and/or death. Furthermore, the patient was informed of those risks and complications associated with the medications. These include, but are not limited to: allergic reactions (i.e.: anaphylactic or anaphylactoid reaction(s)); adrenal axis suppression; blood sugar elevation that in diabetics may result in ketoacidosis or comma; water retention that in patients with history of congestive heart failure may result in shortness of breath, pulmonary edema, and decompensation with resultant heart failure; weight gain; swelling or edema; medication-induced neural toxicity; particulate matter embolism and blood vessel occlusion with resultant organ, and/or nervous system infarction; and/or aseptic necrosis of one or more joints. Finally, the patient was informed that Medicine is not an exact science; therefore, there is also the possibility of unforeseen or unpredictable risks and/or possible complications that may result in a catastrophic outcome. The patient indicated having  understood very clearly. We have given the patient no guarantees and we have made no promises. Enough time was given to the patient to ask questions, all of which were answered to the patient's satisfaction. Kara [name] has indicated that she wanted to continue with the procedure. Attestation: I, the ordering provider, attest that I have discussed with the patient the benefits, risks, side-effects, alternatives, likelihood of achieving goals, and potential problems during recovery for the procedure that I have provided informed consent. Date  Time: 11/10/2017  8:24 AM  Pre-Procedure Preparation:  Monitoring: As per clinic protocol. Respiration, ETCO2, SpO2, BP, heart rate and rhythm monitor placed and checked for adequate function Safety Precautions: Patient was assessed for positional comfort and pressure points before starting the procedure. Time-out: I initiated and conducted the "Time-out" before starting the procedure, as per protocol. The patient was asked to participate by confirming the accuracy of the "Time Out" information. Verification of the correct person, site, and procedure were performed and confirmed by me, the nursing staff, and the patient. "Time-out" conducted as per Joint Commission's Universal Protocol (UP.01.01.01). Time: 0940  Description of Procedure:          Position: Prone with head of the table was raised to facilitate breathing. Target Area: The interlaminar space, initially targeting the lower laminar border of the superior vertebral body. Approach: Paramedial approach. Area Prepped: Entire Posterior Lumbar Region Prepping solution: ChloraPrep (2% chlorhexidine gluconate and 70% isopropyl alcohol) Safety Precautions: Aspiration looking for blood return was conducted prior to all injections. At no point did we inject any substances, as a needle was being advanced. No attempts were made at seeking any paresthesias. Safe injection practices and needle disposal  techniques used. Medications properly checked for expiration dates. SDV (single dose vial) medications used. Description of the Procedure: Protocol guidelines were followed. The procedure needle was introduced through the skin, ipsilateral to the reported pain, and advanced to the target area. Bone was contacted and the needle walked caudad, until the lamina was cleared. The epidural space was identified using "loss-of-resistance technique" with 2-3 ml of PF-NaCl (0.9% NSS), in a 5cc LOR glass syringe. Vitals:   11/10/17 0949 11/10/17 0959 11/10/17  1009 11/10/17 1019  BP: 139/80 (!) 153/66 (!) 159/73 (!) 158/65  Pulse:  82 80 87  Resp: 20 (!) 23 (!) 26 (!) 27  Temp:  98.1 F (36.7 C)  97.6 F (36.4 C)  TempSrc:  Temporal  Temporal  SpO2: 98% 94% 94% 95%  Weight:      Height:        Start Time: 0940 hrs. End Time: 0948 hrs. Materials:  Needle(s) Type: Epidural needle Gauge: 17G Length: 3.5-in Medication(s): Please see orders for medications and dosing details.  Imaging Guidance (Spinal):          Type of Imaging Technique: Fluoroscopy Guidance (Spinal) Indication(s): Assistance in needle guidance and placement for procedures requiring needle placement in or near specific anatomical locations not easily accessible without such assistance. Exposure Time: Please see nurses notes. Contrast: Before injecting any contrast, we confirmed that the patient did not have an allergy to iodine, shellfish, or radiological contrast. Once satisfactory needle placement was completed at the desired level, radiological contrast was injected. Contrast injected under live fluoroscopy. No contrast complications. See chart for type and volume of contrast used. Fluoroscopic Guidance: I was personally present during the use of fluoroscopy. "Tunnel Vision Technique" used to obtain the best possible view of the target area. Parallax error corrected before commencing the procedure. "Direction-depth-direction"  technique used to introduce the needle under continuous pulsed fluoroscopy. Once target was reached, antero-posterior, oblique, and lateral fluoroscopic projection used confirm needle placement in all planes. Images permanently stored in EMR. Interpretation: I personally interpreted the imaging intraoperatively. Adequate needle placement confirmed in multiple planes. Appropriate spread of contrast into desired area was observed. No evidence of afferent or efferent intravascular uptake. No intrathecal or subarachnoid spread observed. Permanent images saved into the patient's record.  Antibiotic Prophylaxis:   Anti-infectives (From admission, onward)   Start     Dose/Rate Route Frequency Ordered Stop   11/10/17 0915  ceFAZolin (ANCEF) IVPB 1 g/50 mL premix     1 g 100 mL/hr over 30 Minutes Intravenous  Once 11/10/17 0902 11/10/17 0940     Indication(s): None identified  Post-operative Assessment:  Post-procedure Vital Signs:  Pulse/HCG Rate: 8784 Temp: 97.6 F (36.4 C) Resp: (!) 27 BP: (!) 158/65 SpO2: 95 %  EBL: None  Complications: No immediate post-treatment complications observed by team, or reported by patient.  Note: The patient tolerated the entire procedure well. A repeat set of vitals were taken after the procedure and the patient was kept under observation following institutional policy, for this type of procedure. Post-procedural neurological assessment was performed, showing return to baseline, prior to discharge. The patient was provided with post-procedure discharge instructions, including a section on how to identify potential problems. Should any problems arise concerning this procedure, the patient was given instructions to immediately contact us, at any time, without hesitation. In any case, we plan to contact the patient by telephone for a follow-up status report regarding this interventional procedure.  Comments:  No additional relevant information.  Plan of Care    Imaging Orders     DG C-Arm 1-60 Min-No Report  Procedure Orders     Lumbar Epidural Injection  Medications ordered for procedure: Meds ordered this encounter  Medications  . iopamidol (ISOVUE-M) 41 % intrathecal injection 10 mL    Must be Myelogram-compatible. If not available, you may substitute with a water-soluble, non-ionic, hypoallergenic, myelogram-compatible radiological contrast medium.  Marland Kitchen lidocaine (XYLOCAINE) 2 % (with pres) injection 400 mg  . midazolam (VERSED)  5 MG/5ML injection 1-2 mg    Make sure Flumazenil is available in the pyxis when using this medication. If oversedation occurs, administer 0.2 mg IV over 15 sec. If after 45 sec no response, administer 0.2 mg again over 1 min; may repeat at 1 min intervals; not to exceed 4 doses (1 mg)  . DISCONTD: fentaNYL (SUBLIMAZE) injection 25-50 mcg    Make sure Narcan is available in the pyxis when using this medication. In the event of respiratory depression (RR< 8/min): Titrate NARCAN (naloxone) in increments of 0.1 to 0.2 mg IV at 2-3 minute intervals, until desired degree of reversal.  . lactated ringers infusion 1,000 mL  . sodium chloride flush (NS) 0.9 % injection 2 mL  . ropivacaine (PF) 2 mg/mL (0.2%) (NAROPIN) injection 2 mL  . triamcinolone acetonide (KENALOG-40) injection 40 mg  . ceFAZolin (ANCEF) IVPB 1 g/50 mL premix    Order Specific Question:   Antibiotic Indication:    Answer:   Surgical Prophylaxis    Order Specific Question:   Other Indication:    Answer:   Surgical Prophylaxis   Medications administered: We administered iopamidol, lidocaine, midazolam, lactated ringers, sodium chloride flush, ropivacaine (PF) 2 mg/mL (0.2%), triamcinolone acetonide, and ceFAZolin.  See the medical record for exact dosing, route, and time of administration.  New Prescriptions   No medications on file   Disposition: Discharge home  Discharge Date & Time: 11/10/2017; 1025 hrs.   Physician-requested Follow-up: Return  for post-procedure eval (2 wks), w/ Dr. Dossie Arbour.  Future Appointments  Date Time Provider [place]  11/17/2017  2:30 PM [place], [place], MD [facility] None  12/05/2017  1:15 PM Milinda Pointer, MD [facility] None   Primary Care Physician: Tracie Harrier, MD Location: [facility] Outpatient Pain Management Facility Note by: Gaspar Cola, MD Date: 11/10/2017; Time: 12:17 PM  Disclaimer:  Medicine is not an exact science. The only guarantee in medicine is that nothing is guaranteed. It is important to note that the decision to proceed with this intervention was based on the information collected from the patient. The Data and conclusions were drawn from the patient's questionnaire, the interview, and the physical examination. Because the information was provided in large part by the patient, it cannot be guaranteed that it has not been purposely or unconsciously manipulated. Every effort has been made to obtain as much relevant data as possible for this evaluation. It is important to note that the conclusions that lead to this procedure are derived in large part from the available data. Always take into account that the treatment will also be dependent on availability of resources and existing treatment guidelines, considered by other Pain Management Practitioners as being common knowledge and practice, at the time of the intervention. For Medico-Legal purposes, it is also important to point out that variation in procedural techniques and pharmacological choices are the acceptable norm. The indications, contraindications, technique, and results of the above procedure should only be interpreted and judged by a Board-Certified Interventional Pain Specialist with extensive familiarity and expertise in the same exact procedure and technique.

## 2017-11-10 ENCOUNTER — Encounter: Payer: Self-pay | Admitting: Pain Medicine

## 2017-11-10 ENCOUNTER — Ambulatory Visit
Admission: RE | Admit: 2017-11-10 | Discharge: 2017-11-10 | Disposition: A | Discharge status: Home / Self Care | Payer: PPO | Source: Ambulatory Visit | Attending: Pain Medicine | Admitting: Pain Medicine

## 2017-11-10 ENCOUNTER — Ambulatory Visit (HOSPITAL_BASED_OUTPATIENT_CLINIC_OR_DEPARTMENT_OTHER): Payer: PPO | Admitting: Pain Medicine

## 2017-11-10 VITALS — BP 158/65 | HR 87 | Temp 97.6°F | Resp 27 | Ht 63.0 in | Wt 218.0 lb

## 2017-11-10 DIAGNOSIS — M5136 Other intervertebral disc degeneration, lumbar region: Secondary | ICD-10-CM | POA: Diagnosis not present

## 2017-11-10 DIAGNOSIS — M5410 Radiculopathy, site unspecified: Secondary | ICD-10-CM | POA: Diagnosis present

## 2017-11-10 DIAGNOSIS — M79604 Pain in right leg: Secondary | ICD-10-CM | POA: Diagnosis not present

## 2017-11-10 DIAGNOSIS — Z7982 Long term (current) use of aspirin: Secondary | ICD-10-CM | POA: Diagnosis not present

## 2017-11-10 DIAGNOSIS — M5442 Lumbago with sciatica, left side: Secondary | ICD-10-CM | POA: Insufficient documentation

## 2017-11-10 DIAGNOSIS — Z79899 Other long term (current) drug therapy: Secondary | ICD-10-CM | POA: Diagnosis not present

## 2017-11-10 DIAGNOSIS — G8929 Other chronic pain: Secondary | ICD-10-CM | POA: Diagnosis not present

## 2017-11-10 DIAGNOSIS — M5441 Lumbago with sciatica, right side: Secondary | ICD-10-CM

## 2017-11-10 DIAGNOSIS — M5416 Radiculopathy, lumbar region: Secondary | ICD-10-CM | POA: Diagnosis not present

## 2017-11-10 DIAGNOSIS — Z91040 Latex allergy status: Secondary | ICD-10-CM

## 2017-11-10 DIAGNOSIS — M79605 Pain in left leg: Secondary | ICD-10-CM | POA: Insufficient documentation

## 2017-11-10 DIAGNOSIS — Z888 Allergy status to other drugs, medicaments and biological substances status: Secondary | ICD-10-CM | POA: Diagnosis not present

## 2017-11-10 MED ORDER — ROPIVACAINE HCL 2 MG/ML IJ SOLN
2.0000 mL | Freq: Once | INTRAMUSCULAR | Status: AC
  Administered 2017-11-10: 2 mL via EPIDURAL
  Filled 2017-11-10: qty 10

## 2017-11-10 MED ORDER — LACTATED RINGERS IV SOLN
1000.0000 mL | Freq: Once | INTRAVENOUS | Status: AC
  Administered 2017-11-10: 1000 mL via INTRAVENOUS

## 2017-11-10 MED ORDER — CEFAZOLIN SODIUM 1 G IJ SOLR
INTRAMUSCULAR | Status: AC
  Filled 2017-11-10: qty 10

## 2017-11-10 MED ORDER — SODIUM CHLORIDE 0.9% FLUSH
2.0000 mL | Freq: Once | INTRAVENOUS | Status: AC
  Administered 2017-11-10: 10 mL

## 2017-11-10 MED ORDER — TRIAMCINOLONE ACETONIDE 40 MG/ML IJ SUSP
40.0000 mg | Freq: Once | INTRAMUSCULAR | Status: AC
  Administered 2017-11-10: 40 mg
  Filled 2017-11-10: qty 1

## 2017-11-10 MED ORDER — LIDOCAINE HCL 2 % IJ SOLN
20.0000 mL | Freq: Once | INTRAMUSCULAR | Status: AC
  Administered 2017-11-10: 400 mg
  Filled 2017-11-10: qty 40

## 2017-11-10 MED ORDER — MIDAZOLAM HCL 5 MG/5ML IJ SOLN
1.0000 mg | INTRAMUSCULAR | Status: DC | PRN
  Administered 2017-11-10: 2 mg via INTRAVENOUS
  Filled 2017-11-10: qty 5

## 2017-11-10 MED ORDER — FENTANYL CITRATE (PF) 100 MCG/2ML IJ SOLN: 25.0000 ug | INTRAMUSCULAR | Status: DC | PRN

## 2017-11-10 MED ORDER — IOPAMIDOL (ISOVUE-M 200) INJECTION 41%
10.0000 mL | Freq: Once | INTRAMUSCULAR | Status: AC
  Administered 2017-11-10: 10 mL via EPIDURAL
  Filled 2017-11-10: qty 10

## 2017-11-10 MED ORDER — CEFAZOLIN SODIUM-DEXTROSE 1-4 GM/50ML-% IV SOLN
1.0000 g | Freq: Once | INTRAVENOUS | Status: AC
  Administered 2017-11-10: 1 g via INTRAVENOUS

## 2017-11-10 NOTE — Patient Instructions (Signed)

## 2017-11-11 ENCOUNTER — Telehealth: Payer: Self-pay | Admitting: Pain Medicine

## 2017-11-11 ENCOUNTER — Telehealth: Payer: Self-pay

## 2017-11-11 NOTE — Telephone Encounter (Signed)
Patient wants to discuss problem she is having since procedure

## 2017-11-11 NOTE — Telephone Encounter (Signed)
Left message on AM instructed to call if needed

## 2017-11-11 NOTE — Telephone Encounter (Signed)
Attempted to call, message left.

## 2017-11-11 NOTE — Telephone Encounter (Signed)
Attempted to return call, message left.

## 2017-11-14 NOTE — Telephone Encounter (Signed)
Reports having pain, also numbness in toes since procedure on 11-10-17, Symptoms have improved since the initial phone call. Advised to wait 7-10 days to achieve any relief from procedure.

## 2017-11-17 ENCOUNTER — Ambulatory Visit (INDEPENDENT_AMBULATORY_CARE_PROVIDER_SITE_OTHER): Payer: PPO | Admitting: Surgery

## 2017-11-17 ENCOUNTER — Encounter: Payer: Self-pay | Admitting: Surgery

## 2017-11-17 VITALS — BP 131/82 | HR 103 | Temp 97.6°F | Ht 63.0 in | Wt 226.8 lb

## 2017-11-17 DIAGNOSIS — T8130XA Disruption of wound, unspecified, initial encounter: Secondary | ICD-10-CM | POA: Diagnosis not present

## 2017-11-17 NOTE — Patient Instructions (Signed)
Please discontinue the packing and place a dry dressing over the area.   Please call our office if you have questions or concerns.

## 2017-11-17 NOTE — Progress Notes (Signed)
CC: wound check Subjective:  S/P loop ileostomy takedown in January of this year Dr. Adonis Huguenin, developed an incisional hernia and small wound dehiscence. No fevers, no abd pain. Main issues is back pain ( unrelated to abd problems)   Objective: Vital signs in last 24 hours:    Physical exam: NAD, obese female Abd: soft, nt , large ventral hernia. Small open wound measures 5 mm diameter. Good granulation. No infection EXt: warm and well perfused, she does have soft tissue contusion right leg after a can fell into her leg, no abscess or infection   ROSH full ROS performed was asked and is otherwise negative other that what is stated on HPI  Lab Results: CBC  No results for input(s): WBC, HGB, HCT, PLT in the last 72 hours. BMET No results for input(s): NA, K, CL, CO2, GLUCOSE, BUN, CREATININE, CALCIUM in the last 72 hours. PT/INR No results for input(s): LABPROT, INR in the last 72 hours. ABG No results for input(s): PHART, HCO3 in the last 72 hours.  Invalid input(s): PCO2, PO2  Studies/Results:   Anti-infectives: Anti-infectives (From admission, onward)   None      Assessment/Plan: Open wound healing well, anticipate closure w/o packin. RTC prn D/W her about incisional hernia, She is not interested in surgical intervention or Risk factor modification at this time ( weight loss) F/U prn    Caroleen Hamman, MD, [facility]  11/17/2017

## 2017-11-18 ENCOUNTER — Ambulatory Visit: Payer: Self-pay | Admitting: Surgery

## 2017-11-23 DIAGNOSIS — S32121A Minimally displaced Zone II fracture of sacrum, initial encounter for closed fracture: Secondary | ICD-10-CM | POA: Diagnosis not present

## 2017-12-05 ENCOUNTER — Ambulatory Visit: Payer: PPO | Admitting: Pain Medicine

## 2017-12-13 NOTE — Progress Notes (Signed)
Patient's Name: Kara [name]  MRN: [id number]  Referring Provider: Tracie Harrier, MD  DOB: 06-27-1950  PCP: Tracie Harrier, MD  DOS: 12/14/2017  Note by: Gaspar Cola, MD  Service setting: Ambulatory outpatient  Specialty: Interventional Pain Management  Location: [name] (AMB) Pain Management Facility    Patient type: Established   Primary Reason(s) for Visit: Encounter for post-procedure evaluation of chronic illness with mild to moderate exacerbation CC: Back Pain ( toes on left foot are still numb)  HPI  Kara [name] is a 67 y.o. year old, female patient, who comes today for a post-procedure evaluation. She has Diabetes mellitus type 2 in obese ([place]); Type 2 diabetes mellitus without complication, without long-term current use of insulin ([place]); BP (high blood pressure); Nocturnal leg cramps; Cough; Chronic hip pain (Left); Acute non-recurrent maxillary sinusitis; Diverticulitis large intestine; Diverticulitis; DDD (degenerative disc disease), lumbar; Benign essential hypertension; Edema of both legs; Morbidly obese ([place]); Acute on chronic systolic (congestive) heart failure ([place]); HCAP (healthcare-associated pneumonia); Encounter for removal of staples; Other pulmonary embolism without acute cor pulmonale ([place]); History of colostomy reversal; Ileostomy in place [facility]); Positive result for methicillin resistant Staphylococcus aureus (MRSA) screening; H/O ileostomy; Diverticulosis of colon; History of pulmonary embolism; S/P IVC filter; Chronic low back pain (Primary Area of Pain) (Bilateral) w/ sciatica (Bilateral); Chronic sacroiliac joint pain (Secondary Area of Pain) (Right); Chronic pain syndrome; Long term current use of opiate analgesic; Pharmacologic therapy; Disorder of skeletal system; Problems influencing health status; Hyponatremia; Hypocalcemia; Sacral insufficiency fracture; 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-S1 anterolisthesis.; Lumbar foraminal stenosis (L3-4 and L4-5)  (Bilateral); Lumbosacral L5-S1 subarticular lateral recess stenosis (Bilateral); Chronic Lumbar facet arthropathy (L3-4, L4-5, and L5-S1) (Bilateral); Lumbar facet syndrome; 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 Her primarily concern today is the Back Pain ( toes on left foot are still numb)  Pain Assessment: Location: Lower, Right, Left Back Radiating: Pain radiates down both leg, left leg is worse causing numbness in three toes Onset: More than a month ago Duration: Chronic pain Quality: Throbbing, Constant, Discomfort Severity: 8 /10 (subjective, self-reported pain score)  Note: Reported level is inconsistent with clinical observations. Clinically the patient looks like a 3/10 A 3/10 is viewed as "Moderate" and described as significantly interfering with activities of daily living (ADL). It becomes difficult to feed, bathe, get dressed, get on and off the toilet or to perform personal hygiene functions. Difficult to get in and out of bed or a chair without assistance. Very distracting. With effort, it can be ignored when deeply involved in activities. Information on the proper use of the pain scale provided to the patient today. When using our objective Pain Scale, levels between 6 and 10/10 are said to belong in an emergency room, as it progressively worsens from a 6/10, described as severely limiting, requiring emergency care not usually available at an outpatient pain management facility. At a 6/10 level, communication  becomes difficult and requires great effort. Assistance to reach the emergency department may be required. Facial flushing and profuse sweating along with potentially dangerous increases in heart rate and blood pressure will be evident. Effect  on ADL: unable to bend or lift things, no prolonged walking or standing Timing: Constant BP: 123/72  HR: 84  Kara [name] comes in today for post-procedure evaluation after the treatment done on 11/11/2017.  Further details on both, my assessment(s), as well as the proposed treatment plan, please see below.  Post-Procedure Assessment  11/11/2017 Procedure: Diagnostic left sided L4-5 interlaminar lumbar epidural steroid injection #1 under fluoroscopic guidance and IV sedation Pre-procedure pain score:  8/10 Post-procedure pain score: 0/10 (100% relief) Influential Factors: BMI: 40.03 kg/m Intra-procedural challenges: None observed.         Assessment challenges: None detected.              Reported side-effects: None.        Post-procedural adverse reactions or complications: None reported         Sedation: Sedation provided. When no sedatives are used, the analgesic levels obtained are directly associated to the effectiveness of the local anesthetics. However, when sedation is provided, the level of analgesia obtained during the initial 1 hour following the intervention, is believed to be the result of a combination of factors. These factors may include, but are not limited to: 1. The effectiveness of the local anesthetics used. 2. The effects of the analgesic(s) and/or anxiolytic(s) used. 3. The degree of discomfort experienced by the patient at the time of the procedure. 4. The patients ability and reliability in recalling and recording the events. 5. The presence and influence of possible secondary gains and/or psychosocial factors. Reported result: Relief experienced during the 1st hour after the procedure: 100 % (Ultra-Short Term Relief)  Kara [name] has indicated area to have been numb during this time. Interpretative annotation: Clinically appropriate result. Analgesia during this period is likely to be Local Anesthetic and/or IV Sedative (Analgesic/Anxiolytic) related.          Effects of local anesthetic: The analgesic effects attained during this period are directly associated to the localized infiltration of local anesthetics and therefore cary significant diagnostic value as to the etiological location, or anatomical origin, of the pain. Expected duration of relief is directly dependent on the pharmacodynamics of the local anesthetic used. Long-acting (4-6 hours) anesthetics used.  Reported result: Relief during the next 4 to 6 hour after the procedure: 80 %(pain was better for about 8 days) (Short-Term Relief)            Interpretative annotation: Clinically appropriate result. Analgesia during this period is likely to be Local Anesthetic-related.          Long-term benefit: Defined as the period of time past the expected duration of local anesthetics (1 hour for short-acting and 4-6 hours for long-acting). With the possible exception of prolonged sympathetic blockade from the local anesthetics, benefits during this period are typically attributed to, or associated with, other factors such as analgesic sensory neuropraxia, antiinflammatory effects, or beneficial biochemical changes provided by agents other than the local anesthetics.  Reported result: Extended relief following procedure: 0 % (Long-Term Relief)            Interpretative annotation: Clinically possible results. No benefit. Therapeutic failure. No significant inflammatory component detected. Etiology is likely mechanical rather than inflammatory.  Current benefits: Defined as reported results that persistent at this point in time.   Analgesia: 0-25 % Ms. Yow reports improvement of extremity symptoms. Function: Back to baseline ROM: Back to  baseline Interpretative annotation: Recurrence of symptoms. Therapeutic failure. Results would argue against repeating therapy.  Interpretation: Results would suggest a successful diagnostic intervention. Results would suggest reassessment to be warranted.          Plan:  Re-assessment of algesic etiology.                Laboratory Chemistry  Inflammation Markers (CRP: Acute Phase) (ESR: Chronic Phase) Lab Results  Component Value Date   CRP 5.3 (H) 11/02/2017   ESRSEDRATE 12 11/02/2017                         Renal Markers Lab Results  Component Value Date   BUN 19 10/03/2017   CREATININE 0.63 10/03/2017   GFRAA >60 10/03/2017   GFRNONAA >60 10/03/2017                             Hepatic Markers Lab Results  Component Value Date   AST 40 04/21/2017   ALT 53 04/21/2017   ALBUMIN 3.8 04/21/2017                        Neuropathy Markers Lab Results  Component Value Date   HGBA1C 7.2 (H) 02/17/2017   HIV Non Reactive 11/18/2016                        Hematology Parameters Lab Results  Component Value Date   PLT 200 10/03/2017   HGB 16.5 (H) 10/03/2017   HCT 49.7 (H) 10/03/2017                        CV Markers Lab Results  Component Value Date   BNP 60.0 11/17/2016   CKTOTAL 71 10/02/2011   CKMB < 0.5 (L) 10/02/2011   TROPONINI <0.03 11/17/2016                         Note: Lab results reviewed.  Recent Diagnostic Imaging Results  DG C-Arm 1-60 Min-No Report Fluoroscopy was utilized by the requesting physician.  No radiographic  interpretation.   Complexity Note: I personally reviewed the fluoroscopic imaging of the procedure.                        Meds   Current Outpatient Medications:  .  aspirin EC 81 MG tablet, Take 81 mg by mouth daily., Disp: , Rfl:  .  candesartan (ATACAND) 16 MG tablet, Take 8 mg by mouth daily. , Disp: , Rfl:  .  cyclobenzaprine (FLEXERIL) 10 MG tablet, Take 10 mg by mouth at bedtime. , Disp: , Rfl:  .  gabapentin  (NEURONTIN) 100 MG capsule, Take 200 mg by mouth 3 (three) times daily. , Disp: , Rfl:  .  glipiZIDE (GLUCOTROL XL) 5 MG 24 hr tablet, Take 5 mg by mouth daily with breakfast. , Disp: , Rfl:  .  Multiple Vitamin (MULTI-VITAMINS) TABS, Take 1 tablet by mouth daily., Disp: , Rfl:   ROS  Constitutional: Denies any fever or chills Gastrointestinal: No reported hemesis, hematochezia, vomiting, or acute GI distress Musculoskeletal: Denies any acute onset joint swelling, redness, loss of ROM, or weakness Neurological: No reported episodes of acute onset apraxia, aphasia, dysarthria, agnosia, amnesia, paralysis, loss of coordination, or loss of consciousness  Allergies  Ms. Joswick is allergic to adhesive [tape]; morphine; other; latex; and morphine and related.  [place]  Drug: Ms. Quant  reports that she does not use drugs. Alcohol:  reports that she does not drink alcohol. Tobacco:  reports that she has never smoked. She has never used smokeless tobacco. 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 Surgical: Ms. Mcnear  has a past surgical history that includes Knee surgery; Carpal tunnel release (Bilateral); Colon resection sigmoid (N/A, 11/02/2016); Colostomy (N/A, 11/02/2016); Sigmoidoscopy (N/A, 11/13/2016); PULMONARY VENOGRAPHY (N/A, 11/19/2016); Colonoscopy with propofol (N/A, 02/03/2017); Colon surgery (11/02/2016); IVC FILTER INSERTION (Right, 10/2016); Colostomy reversal (N/A, 02/24/2017); Appendectomy (N/A, 02/24/2017); Lysis of adhesion (N/A, 02/24/2017); laparoscopy (N/A, 02/24/2017); Ileo loop diversion (N/A, 02/24/2017); Sigmoidoscopy (N/A, 05/19/2017); Ileostomy closure (N/A, 06/01/2017); IVC FILTER REMOVAL (N/A, 08/09/2017); and Sacroplasty (N/A, 11/08/2017). Family: family history  includes Breast cancer in her cousin and mother; Cancer in her mother; Diabetes in her mother and sister; Heart disease in her father, mother, and sister.  Constitutional Exam  General appearance: Well nourished, well developed, and well hydrated. In no apparent acute distress Vitals:   12/14/17 0954  BP: 123/72  Pulse: 84  Temp: 98 F (36.7 C)  SpO2: 98%  Weight: 226 lb (102.5 kg)  Height: _0  (1.6 m)   BMI Assessment: Estimated body mass index is 40.03 kg/m as calculated from the following:   Height as of this encounter: _1  (1.6 m).   Weight as of this encounter: 226 lb (102.5 kg).  BMI interpretation table: BMI level Category Range association with higher incidence of chronic pain  <18 kg/m2 Underweight   18.5-24.9 kg/m2 Ideal body weight   25-29.9 kg/m2 Overweight Increased incidence by 20%  30-34.9 kg/m2 Obese (Class I) Increased incidence by 68%  35-39.9 kg/m2 Severe obesity (Class II) Increased incidence by 136%  >40 kg/m2 Extreme obesity (Class III) Increased incidence by 254%   Patient's current BMI Ideal Body weight  Body mass index is 40.03 kg/m. Ideal body weight: 52.4 kg (115 lb 8.3 oz) Adjusted ideal body weight: 72.4 kg (159 lb 11.4 oz)   BMI Readings from Last 4 Encounters:  12/14/17 40.03 kg/m  11/17/17 40.18 kg/m  11/10/17 38.62 kg/m  11/08/17 40.39 kg/m   Wt Readings from Last 4 Encounters:  12/14/17 226 lb (102.5 kg)  11/17/17 226 lb 12.8 oz (102.9 kg)  11/10/17 218 lb (98.9 kg)  11/08/17 228 lb (103.4 kg)  Psych/Mental status: Alert, oriented x 3 (person, place, & time)       Eyes: PERLA Respiratory: No evidence of acute respiratory distress  Cervical Spine Area Exam  Skin & Axial Inspection: No masses, redness, edema, swelling, or associated skin lesions Alignment: Symmetrical Functional ROM: Unrestricted ROM      Stability: No instability detected Muscle Tone/Strength: Functionally intact. No obvious neuro-muscular anomalies  detected. Sensory (Neurological): Unimpaired Palpation: No palpable anomalies              Upper Extremity (UE) Exam    Side: Right upper extremity  Side: Left upper extremity  Skin & Extremity Inspection: Skin color, temperature, and hair growth are WNL. No peripheral edema or cyanosis. No masses, redness, swelling, asymmetry, or associated skin lesions. No contractures.  Skin & Extremity Inspection: Skin color, temperature, and hair growth are WNL. No peripheral edema or cyanosis. No masses, redness, swelling, asymmetry, or associated skin lesions. No contractures.  Functional ROM: Unrestricted ROM  Functional ROM: Unrestricted ROM          Muscle Tone/Strength: Functionally intact. No obvious neuro-muscular anomalies detected.  Muscle Tone/Strength: Functionally intact. No obvious neuro-muscular anomalies detected.  Sensory (Neurological): Unimpaired          Sensory (Neurological): Unimpaired          Palpation: No palpable anomalies              Palpation: No palpable anomalies              Provocative Test(s):  Phalen's test: deferred Tinel's test: deferred Apley's scratch test (touch opposite shoulder):  Action 1 (Across chest): deferred Action 2 (Overhead): deferred Action 3 (LB reach): deferred   Provocative Test(s):  Phalen's test: deferred Tinel's test: deferred Apley's scratch test (touch opposite shoulder):  Action 1 (Across chest): deferred Action 2 (Overhead): deferred Action 3 (LB reach): deferred    Thoracic Spine Area Exam  Skin & Axial Inspection: No masses, redness, or swelling Alignment: Symmetrical Functional ROM: Unrestricted ROM Stability: No instability detected Muscle Tone/Strength: Functionally intact. No obvious neuro-muscular anomalies detected. Sensory (Neurological): Unimpaired Muscle strength & Tone: No palpable anomalies  Lumbar Spine Area Exam  Skin & Axial Inspection: No masses, redness, or swelling Alignment: Symmetrical Functional  ROM: Decreased ROM affecting both sides Stability: No instability detected Muscle Tone/Strength: Functionally intact. No obvious neuro-muscular anomalies detected. Sensory (Neurological): Movement-associated pain Palpation: Complains of area being tender to palpation       Provocative Tests: Lumbar Hyperextension/rotation test: (+) bilaterally for facet joint pain. Lumbar quadrant test (Kemp's test): (+) bilaterally for facet joint pain. Lumbar Lateral bending test: deferred today       Patrick's Maneuver: deferred today                   FABER test: deferred today                   Thigh-thrust test: deferred today       S-I compression test: deferred today       S-I distraction test: deferred today        Gait & Posture Assessment  Ambulation: Patient ambulates using a cane Gait: Limited. Using assistive device to ambulate Posture: Difficulty standing up straight, due to pain   Lower Extremity Exam    Side: Right lower extremity  Side: Left lower extremity  Stability: No instability observed          Stability: No instability observed          Skin & Extremity Inspection: Skin color, temperature, and hair growth are WNL. No peripheral edema or cyanosis. No masses, redness, swelling, asymmetry, or associated skin lesions. No contractures.  Skin & Extremity Inspection: Skin color, temperature, and hair growth are WNL. No peripheral edema or cyanosis. No masses, redness, swelling, asymmetry, or associated skin lesions. No contractures.  Functional ROM: Unrestricted ROM                  Functional ROM: Unrestricted ROM                  Muscle Tone/Strength: Functionally intact. No obvious neuro-muscular anomalies detected.  Muscle Tone/Strength: Functionally intact. No obvious neuro-muscular anomalies detected.  Sensory (Neurological): Unimpaired  Sensory (Neurological): Unimpaired  Palpation: No palpable anomalies  Palpation: No palpable anomalies   Assessment  Primary Diagnosis &  Pertinent Problem List: The primary encounter diagnosis was Chronic low back pain (Primary Area of Pain) (Bilateral) w/ sciatica (  Bilateral). Diagnoses of Chronic lower extremity pain (Secondary Area of Pain) (Bilateral), Chronic sacroiliac joint pain (Secondary Area of Pain) (Right), Chronic low back pain (Primary Area of Pain) (Bilateral), Lumbar facet syndrome, Spondylosis without myelopathy or radiculopathy, lumbosacral region, and Other specified dorsopathies, sacral and sacrococcygeal region were also pertinent to this visit.  Status Diagnosis  Persistent Persistent Unimproved 1. Chronic low back pain (Primary Area of Pain) (Bilateral) w/ sciatica (Bilateral)   2. Chronic lower extremity pain (Secondary Area of Pain) (Bilateral)   3. Chronic sacroiliac joint pain (Secondary Area of Pain) (Right)   4. Chronic low back pain (Primary Area of Pain) (Bilateral)   5. Lumbar facet syndrome   6. Spondylosis without myelopathy or radiculopathy, lumbosacral region   7. Other specified dorsopathies, sacral and sacrococcygeal region     Problems updated and reviewed during this visit: No problems updated. Plan of Care  Pharmacotherapy (Medications Ordered): No orders of the defined types were placed in this encounter.  Medications administered today: [name] had no medications administered during this visit.   Procedure Orders     LUMBAR FACET(MEDIAL BRANCH NERVE BLOCK) [facility] Lab Orders  No laboratory test(s) ordered today   Imaging Orders  No imaging studies ordered today   Referral Orders  No referral(s) requested today    Interventional management options: Planned, scheduled, and/or pending:   Diagnostic bilateral lumbar facet block #1  Diagnostic caudal ESI #1  vs  left sided L4-5 interlaminar lumbar epidural steroid injection #2    Considering:   Diagnostic bilateral lumbar facet block  Possible bilateral lumbar facet RFA  Diagnostic L5-S1 LESI  Diagnostic  bilateral L3 transforaminal ESI  Diagnostic bilateral L4 transforaminal ESI  Diagnostic bilateral L5 transforaminal ESI  Diagnostic right-sided sacroiliac joint block  Possible right-sided sacroiliac joint RFA  Diagnostic bilateral lumbar facet block  Possible bilateral lumbar facet RFA  Diagnostic right-sided intra-articular hip joint injection  Diagnostic right-sided femoral nerve + Obturator nerve block  Possible right-sided femoral nerve + obturator RFA    Palliative PRN treatment(s):   None at this time   Provider-requested follow-up: Return for Procedure (w/ sedation): (B) L-FCT BLK #1.  Future Appointments  Date Time Provider [place]  12/27/2017  9:15 AM Milinda Pointer, MD [facility] None   Primary Care Physician: Tracie Harrier, MD Location: [facility] Facility Note by: Gaspar Cola, MD Date: 12/14/2017; Time: 11:09 AM

## 2017-12-14 ENCOUNTER — Other Ambulatory Visit: Payer: Self-pay

## 2017-12-14 ENCOUNTER — Encounter: Payer: Self-pay | Admitting: Pain Medicine

## 2017-12-14 ENCOUNTER — Ambulatory Visit: Payer: PPO | Attending: Pain Medicine | Admitting: Pain Medicine

## 2017-12-14 VITALS — BP 123/72 | HR 84 | Temp 98.0°F | Ht 63.0 in | Wt 226.0 lb

## 2017-12-14 DIAGNOSIS — Z86711 Personal history of pulmonary embolism: Secondary | ICD-10-CM | POA: Diagnosis not present

## 2017-12-14 DIAGNOSIS — M4316 Spondylolisthesis, lumbar region: Secondary | ICD-10-CM | POA: Diagnosis not present

## 2017-12-14 DIAGNOSIS — M79605 Pain in left leg: Secondary | ICD-10-CM | POA: Diagnosis not present

## 2017-12-14 DIAGNOSIS — M533 Sacrococcygeal disorders, not elsewhere classified: Secondary | ICD-10-CM | POA: Diagnosis not present

## 2017-12-14 DIAGNOSIS — M5136 Other intervertebral disc degeneration, lumbar region: Secondary | ICD-10-CM | POA: Diagnosis not present

## 2017-12-14 DIAGNOSIS — Z888 Allergy status to other drugs, medicaments and biological substances status: Secondary | ICD-10-CM | POA: Insufficient documentation

## 2017-12-14 DIAGNOSIS — M47816 Spondylosis without myelopathy or radiculopathy, lumbar region: Secondary | ICD-10-CM

## 2017-12-14 DIAGNOSIS — Z79891 Long term (current) use of opiate analgesic: Secondary | ICD-10-CM | POA: Diagnosis not present

## 2017-12-14 DIAGNOSIS — M25552 Pain in left hip: Secondary | ICD-10-CM | POA: Diagnosis not present

## 2017-12-14 DIAGNOSIS — M47817 Spondylosis without myelopathy or radiculopathy, lumbosacral region: Secondary | ICD-10-CM | POA: Diagnosis not present

## 2017-12-14 DIAGNOSIS — M1611 Unilateral primary osteoarthritis, right hip: Secondary | ICD-10-CM | POA: Diagnosis not present

## 2017-12-14 DIAGNOSIS — M5442 Lumbago with sciatica, left side: Secondary | ICD-10-CM | POA: Diagnosis not present

## 2017-12-14 DIAGNOSIS — I5023 Acute on chronic systolic (congestive) heart failure: Secondary | ICD-10-CM | POA: Diagnosis not present

## 2017-12-14 DIAGNOSIS — M5388 Other specified dorsopathies, sacral and sacrococcygeal region: Secondary | ICD-10-CM

## 2017-12-14 DIAGNOSIS — I110 Hypertensive heart disease with heart failure: Secondary | ICD-10-CM | POA: Insufficient documentation

## 2017-12-14 DIAGNOSIS — M4807 Spinal stenosis, lumbosacral region: Secondary | ICD-10-CM | POA: Diagnosis not present

## 2017-12-14 DIAGNOSIS — Z5181 Encounter for therapeutic drug level monitoring: Secondary | ICD-10-CM | POA: Insufficient documentation

## 2017-12-14 DIAGNOSIS — G894 Chronic pain syndrome: Secondary | ICD-10-CM | POA: Insufficient documentation

## 2017-12-14 DIAGNOSIS — Z79899 Other long term (current) drug therapy: Secondary | ICD-10-CM | POA: Insufficient documentation

## 2017-12-14 DIAGNOSIS — Z885 Allergy status to narcotic agent status: Secondary | ICD-10-CM | POA: Insufficient documentation

## 2017-12-14 DIAGNOSIS — Z6841 Body Mass Index (BMI) 40.0 and over, adult: Secondary | ICD-10-CM | POA: Insufficient documentation

## 2017-12-14 DIAGNOSIS — M545 Low back pain, unspecified: Secondary | ICD-10-CM | POA: Insufficient documentation

## 2017-12-14 DIAGNOSIS — M79604 Pain in right leg: Secondary | ICD-10-CM

## 2017-12-14 DIAGNOSIS — Z95828 Presence of other vascular implants and grafts: Secondary | ICD-10-CM | POA: Diagnosis not present

## 2017-12-14 DIAGNOSIS — R200 Anesthesia of skin: Secondary | ICD-10-CM | POA: Diagnosis not present

## 2017-12-14 DIAGNOSIS — G8929 Other chronic pain: Secondary | ICD-10-CM

## 2017-12-14 DIAGNOSIS — M48061 Spinal stenosis, lumbar region without neurogenic claudication: Secondary | ICD-10-CM | POA: Diagnosis not present

## 2017-12-14 DIAGNOSIS — Z932 Ileostomy status: Secondary | ICD-10-CM | POA: Diagnosis not present

## 2017-12-14 DIAGNOSIS — Z7982 Long term (current) use of aspirin: Secondary | ICD-10-CM | POA: Insufficient documentation

## 2017-12-14 DIAGNOSIS — M5441 Lumbago with sciatica, right side: Secondary | ICD-10-CM | POA: Diagnosis not present

## 2017-12-14 DIAGNOSIS — Z7984 Long term (current) use of oral hypoglycemic drugs: Secondary | ICD-10-CM | POA: Insufficient documentation

## 2017-12-14 DIAGNOSIS — E1142 Type 2 diabetes mellitus with diabetic polyneuropathy: Secondary | ICD-10-CM | POA: Insufficient documentation

## 2017-12-14 NOTE — Patient Instructions (Addendum)
____________________________________________________________________________________________  Preparing for Procedure with Sedation  Instructions: . Oral Intake: Do not eat or drink anything for at least 8 hours prior to your procedure. . Transportation: Public transportation is not allowed. Bring an adult driver. The driver must be physically present in our waiting room before any procedure can be started. Marland Kitchen Physical Assistance: Bring an adult physically capable of assisting you, in the event you need help. This adult should keep you company at home for at least 6 hours after the procedure. . Blood Pressure Medicine: Take your blood pressure medicine with a sip of water the morning of the procedure. . Blood thinners: Notify our staff if you are taking any blood thinners. Depending on which one you take, there will be specific instructions on how and when to stop it. . Diabetics on insulin: Notify the staff so that you can be scheduled 1st case in the morning. If your diabetes requires high dose insulin, take only  of your normal insulin dose the morning of the procedure and notify the staff that you have done so. . Preventing infections: Shower with an antibacterial soap the morning of your procedure. . Build-up your immune system: Take 1000 mg of Vitamin C with every meal (3 times a day) the day prior to your procedure. Marland Kitchen Antibiotics: Inform the staff if you have a condition or reason that requires you to take antibiotics before dental procedures. . Pregnancy: If you are pregnant, call and cancel the procedure. . Sickness: If you have a cold, fever, or any active infections, call and cancel the procedure. . Arrival: You must be in the facility at least 30 minutes prior to your scheduled procedure. . Children: Do not bring children with you. . Dress appropriately: Bring dark clothing that you would not mind if they get stained. . Valuables: Do not bring any jewelry or valuables.  Procedure  appointments are reserved for interventional treatments only. Marland Kitchen No Prescription Refills. . No medication changes will be discussed during procedure appointments. . No disability issues will be discussed.  Reasons to call and reschedule or cancel your procedure: (Following these recommendations will minimize the risk of a serious complication.) . Surgeries: Avoid having procedures within 2 weeks of any surgery. (Avoid for 2 weeks before or after any surgery). . Flu Shots: Avoid having procedures within 2 weeks of a flu shots or . (Avoid for 2 weeks before or after immunizations). . Barium: Avoid having a procedure within 7-10 days after having had a radiological study involving the use of radiological contrast. (Myelograms, Barium swallow or enema study). . Heart attacks: Avoid any elective procedures or surgeries for the initial 6 months after a "Myocardial Infarction" (Heart Attack). . Blood thinners: It is imperative that you stop these medications before procedures. Let us know if you if you take any blood thinner.  . Infection: Avoid procedures during or within two weeks of an infection (including chest colds or gastrointestinal problems). Symptoms associated with infections include: Localized redness, fever, chills, night sweats or profuse sweating, burning sensation when voiding, cough, congestion, stuffiness, runny nose, sore throat, diarrhea, nausea, vomiting, cold or Flu symptoms, recent or current infections. It is specially important if the infection is over the area that we intend to treat. Marland Kitchen Heart and lung problems: Symptoms that may suggest an active cardiopulmonary problem include: cough, chest pain, breathing difficulties or shortness of breath, dizziness, ankle swelling, uncontrolled high or unusually low blood pressure, and/or palpitations. If you are experiencing any of these symptoms, cancel  your procedure and contact your primary care physician for an evaluation.  Remember:   Regular Business hours are:  Monday to Thursday 8:00 AM to 4:00 PM  Provider's Schedule: Milinda Pointer, MD:  Procedure days: Tuesday and Thursday 7:30 AM to 4:00 PM  Gillis Santa, MD:  Procedure days: Monday and Wednesday 7:30 AM to 4:00 PM ____________________________________________________________________________________________   ____________________________________________________________________________________________  Pain Scale  Introduction: The pain score used by this practice is the Verbal Numerical Rating Scale (VNRS-11). This is an 11-point scale. It is for adults and children 10 years or older. There are significant differences in how the pain score is reported, used, and applied. Forget everything you learned in the past and learn this scoring system.  General Information: The scale should reflect your current level of pain. Unless you are specifically asked for the level of your worst pain, or your average pain. If you are asked for one of these two, then it should be understood that it is over the past 24 hours.  Basic Activities of Daily Living (ADL): Personal hygiene, dressing, eating, transferring, and using restroom.  Instructions: Most patients tend to report their level of pain as a combination of two factors, their physical pain and their psychosocial pain. This last one is also known as "suffering" and it is reflection of how physical pain affects you socially and psychologically. From now on, report them separately. From this point on, when asked to report your pain level, report only your physical pain. Use the following table for reference.  Pain Clinic Pain Levels (0-5/10)  Pain Level Score  Description  No Pain 0   Mild pain 1 Nagging, annoying, but does not interfere with basic activities of daily living (ADL). Patients are able to eat, bathe, get dressed, toileting (being able to get on and off the toilet and perform personal hygiene functions),  transfer (move in and out of bed or a chair without assistance), and maintain continence (able to control bladder and bowel functions). Blood pressure and heart rate are unaffected. A normal heart rate for a healthy adult ranges from 60 to 100 bpm (beats per minute).   Mild to moderate pain 2 Noticeable and distracting. Impossible to hide from other people. More frequent flare-ups. Still possible to adapt and function close to normal. It can be very annoying and may have occasional stronger flare-ups. With discipline, patients may get used to it and adapt.   Moderate pain 3 Interferes significantly with activities of daily living (ADL). It becomes difficult to feed, bathe, get dressed, get on and off the toilet or to perform personal hygiene functions. Difficult to get in and out of bed or a chair without assistance. Very distracting. With effort, it can be ignored when deeply involved in activities.   Moderately severe pain 4 Impossible to ignore for more than a few minutes. With effort, patients may still be able to manage work or participate in some social activities. Very difficult to concentrate. Signs of autonomic nervous system discharge are evident: dilated pupils (mydriasis); mild sweating (diaphoresis); sleep interference. Heart rate becomes elevated (>115 bpm). Diastolic blood pressure (lower number) rises above 100 mmHg. Patients find relief in laying down and not moving.   Severe pain 5 Intense and extremely unpleasant. Associated with frowning face and frequent crying. Pain overwhelms the senses.  Ability to do any activity or maintain social relationships becomes significantly limited. Conversation becomes difficult. Pacing back and forth is common, as getting into a comfortable position is nearly impossible.  Pain wakes you up from deep sleep. Physical signs will be obvious: pupillary dilation; increased sweating; goosebumps; brisk reflexes; cold, clammy hands and feet; nausea, vomiting or  dry heaves; loss of appetite; significant sleep disturbance with inability to fall asleep or to remain asleep. When persistent, significant weight loss is observed due to the complete loss of appetite and sleep deprivation.  Blood pressure and heart rate becomes significantly elevated. Caution: If elevated blood pressure triggers a pounding headache associated with blurred vision, then the patient should immediately seek attention at an urgent or emergency care unit, as these may be signs of an impending stroke.    Emergency Department Pain Levels (6-10/10)  Emergency Room Pain 6 Severely limiting. Requires emergency care and should not be seen or managed at an outpatient pain management facility. Communication becomes difficult and requires great effort. Assistance to reach the emergency department may be required. Facial flushing and profuse sweating along with potentially dangerous increases in heart rate and blood pressure will be evident.   Distressing pain 7 Self-care is very difficult. Assistance is required to transport, or use restroom. Assistance to reach the emergency department will be required. Tasks requiring coordination, such as bathing and getting dressed become very difficult.   Disabling pain 8 Self-care is no longer possible. At this level, pain is disabling. The individual is unable to do even the most "basic" activities such as walking, eating, bathing, dressing, transferring to a bed, or toileting. Fine motor skills are lost. It is difficult to think clearly.   Incapacitating pain 9 Pain becomes incapacitating. Thought processing is no longer possible. Difficult to remember your own name. Control of movement and coordination are lost.   The worst pain imaginable 10 At this level, most patients pass out from pain. When this level is reached, collapse of the autonomic nervous system occurs, leading to a sudden drop in blood pressure and heart rate. This in turn results in a  temporary and dramatic drop in blood flow to the brain, leading to a loss of consciousness. Fainting is one of the body's self defense mechanisms. Passing out puts the brain in a calmed state and causes it to shut down for a while, in order to begin the healing process.    Summary: 1. Refer to this scale when providing [place] with your pain level. 2. Be accurate and careful when reporting your pain level. This will help with your care. 3. Over-reporting your pain level will lead to loss of credibility. 4. Even a level of 1/10 means that there is pain and will be treated at our facility. 5. High, inaccurate reporting will be documented as "Symptom Exaggeration", leading to loss of credibility and suspicions of possible secondary gains such as obtaining more narcotics, or wanting to appear disabled, for fraudulent reasons. 6. Only pain levels of 5 or below will be seen at our facility. 7. Pain levels of 6 and above will be sent to the Emergency Department and the appointment cancelled. ____________________________________________________________________________________________   Facet Blocks Patient Information  Description: The facets are joints in the spine between the vertebrae.  Like any joints in the body, facets can become irritated and painful.  Arthritis can also effect the facets.  By injecting steroids and local anesthetic in and around these joints, we can temporarily block the nerve supply to them.  Steroids act directly on irritated nerves and tissues to reduce selling and inflammation which often leads to decreased pain.  Facet blocks may be done anywhere along the  spine from the neck to the low back depending upon the location of your pain.   After numbing the skin with local anesthetic (like Novocaine), a small needle is passed onto the facet joints under x-ray guidance.  You may experience a sensation of pressure while this is being done.  The entire block usually lasts about 15-25  minutes.   Conditions which may be treated by facet blocks:   Low back/buttock pain  Neck/shoulder pain  Certain types of headaches  Preparation for the injection:  1. Do not eat any solid food or dairy products within 8 hours of your appointment. 2. You may drink clear liquid up to 3 hours before appointment.  Clear liquids include water, black coffee, juice or soda.  No milk or cream please. 3. You may take your regular medication, including pain medications, with a sip of water before your appointment.  Diabetics should hold regular insulin (if taken separately) and take 1/2 normal NPH dose the morning of the procedure.  Carry some sugar containing items with you to your appointment. 4. A driver must accompany you and be prepared to drive you home after your procedure. 5. Bring all your current medications with you. 6. An IV may be inserted and sedation may be given at the discretion of the physician. 7. A blood pressure cuff, EKG and other monitors will often be applied during the procedure.  Some patients may need to have extra oxygen administered for a short period. 8. You will be asked to provide medical information, including your allergies and medications, prior to the procedure.  We must know immediately if you are taking blood thinners (like Coumadin/Warfarin) or if you are allergic to IV iodine contrast (dye).  We must know if you could possible be pregnant.  Possible side-effects:   Bleeding from needle site  Infection (rare, may require surgery)  Nerve injury (rare)  Numbness & tingling (temporary)  Difficulty urinating (rare, temporary)  Spinal headache (a headache worse with upright posture)  Light-headedness (temporary)  Pain at injection site (serveral days)  Decreased blood pressure (rare, temporary)  Weakness in arm/leg (temporary)  Pressure sensation in back/neck (temporary)   Call if you experience:   Fever/chills associated with headache or  increased back/neck pain  Headache worsened by an upright position  New onset, weakness or numbness of an extremity below the injection site  Hives or difficulty breathing (go to the emergency room)  Inflammation or drainage at the injection site(s)  Severe back/neck pain greater than usual  New symptoms which are concerning to you  Please note:  Although the local anesthetic injected can often make your back or neck feel good for several hours after the injection, the pain will likely return. It takes 3-7 days for steroids to work.  You may not notice any pain relief for at least one week.  If effective, we will often do a series of 2-3 injections spaced 3-6 weeks apart to maximally decrease your pain.  After the initial series, you may be a candidate for a more permanent nerve block of the facets.  If you have any questions, please call #[phone]) [facility]

## 2017-12-27 ENCOUNTER — Ambulatory Visit
Admission: RE | Admit: 2017-12-27 | Discharge: 2017-12-27 | Disposition: A | Discharge status: Home / Self Care | Payer: PPO | Source: Ambulatory Visit | Attending: Pain Medicine | Admitting: Pain Medicine

## 2017-12-27 ENCOUNTER — Encounter: Payer: Self-pay | Admitting: Pain Medicine

## 2017-12-27 ENCOUNTER — Ambulatory Visit (HOSPITAL_BASED_OUTPATIENT_CLINIC_OR_DEPARTMENT_OTHER): Payer: PPO | Admitting: Pain Medicine

## 2017-12-27 ENCOUNTER — Other Ambulatory Visit: Payer: Self-pay

## 2017-12-27 VITALS — BP 115/69 | HR 73 | Temp 97.6°F | Resp 24 | Ht 63.0 in | Wt 217.0 lb

## 2017-12-27 DIAGNOSIS — Z888 Allergy status to other drugs, medicaments and biological substances status: Secondary | ICD-10-CM | POA: Insufficient documentation

## 2017-12-27 DIAGNOSIS — Z79899 Other long term (current) drug therapy: Secondary | ICD-10-CM | POA: Diagnosis not present

## 2017-12-27 DIAGNOSIS — Z885 Allergy status to narcotic agent status: Secondary | ICD-10-CM | POA: Insufficient documentation

## 2017-12-27 DIAGNOSIS — Z7982 Long term (current) use of aspirin: Secondary | ICD-10-CM | POA: Diagnosis not present

## 2017-12-27 DIAGNOSIS — M47816 Spondylosis without myelopathy or radiculopathy, lumbar region: Secondary | ICD-10-CM

## 2017-12-27 DIAGNOSIS — F419 Anxiety disorder, unspecified: Secondary | ICD-10-CM | POA: Diagnosis not present

## 2017-12-27 DIAGNOSIS — M545 Low back pain, unspecified: Secondary | ICD-10-CM

## 2017-12-27 DIAGNOSIS — M1288 Other specific arthropathies, not elsewhere classified, other specified site: Secondary | ICD-10-CM | POA: Insufficient documentation

## 2017-12-27 DIAGNOSIS — G8929 Other chronic pain: Secondary | ICD-10-CM

## 2017-12-27 DIAGNOSIS — M47817 Spondylosis without myelopathy or radiculopathy, lumbosacral region: Secondary | ICD-10-CM | POA: Diagnosis not present

## 2017-12-27 DIAGNOSIS — Z91040 Latex allergy status: Secondary | ICD-10-CM

## 2017-12-27 DIAGNOSIS — Z7951 Long term (current) use of inhaled steroids: Secondary | ICD-10-CM | POA: Insufficient documentation

## 2017-12-27 DIAGNOSIS — Z7984 Long term (current) use of oral hypoglycemic drugs: Secondary | ICD-10-CM | POA: Diagnosis not present

## 2017-12-27 MED ORDER — LACTATED RINGERS IV SOLN
1000.0000 mL | Freq: Once | INTRAVENOUS | Status: AC
  Administered 2017-12-27: 1000 mL via INTRAVENOUS

## 2017-12-27 MED ORDER — MIDAZOLAM HCL 5 MG/5ML IJ SOLN
1.0000 mg | INTRAMUSCULAR | Status: DC | PRN
  Administered 2017-12-27: 2 mg via INTRAVENOUS
  Filled 2017-12-27: qty 5

## 2017-12-27 MED ORDER — FENTANYL CITRATE (PF) 100 MCG/2ML IJ SOLN: 25.0000 ug | INTRAMUSCULAR | Status: DC | PRN

## 2017-12-27 MED ORDER — FENTANYL CITRATE (PF) 100 MCG/2ML IJ SOLN
INTRAMUSCULAR | Status: AC
  Filled 2017-12-27: qty 2

## 2017-12-27 MED ORDER — TRIAMCINOLONE ACETONIDE 40 MG/ML IJ SUSP
80.0000 mg | Freq: Once | INTRAMUSCULAR | Status: AC
  Administered 2017-12-27: 40 mg
  Filled 2017-12-27: qty 2

## 2017-12-27 MED ORDER — ROPIVACAINE HCL 2 MG/ML IJ SOLN
18.0000 mL | Freq: Once | INTRAMUSCULAR | Status: AC
  Administered 2017-12-27: 10 mL via PERINEURAL
  Filled 2017-12-27: qty 20

## 2017-12-27 MED ORDER — LIDOCAINE HCL 2 % IJ SOLN
20.0000 mL | Freq: Once | INTRAMUSCULAR | Status: AC
  Administered 2017-12-27: 400 mg
  Filled 2017-12-27: qty 20

## 2017-12-27 NOTE — Progress Notes (Signed)
Safety precautions to be maintained throughout the outpatient stay will include: orient to surroundings, keep bed in low position, maintain call bell within reach at all times, provide assistance with transfer out of bed and ambulation.  

## 2017-12-27 NOTE — Progress Notes (Signed)
Patient's Name: Kara [name]  MRN: [id number]  Referring Provider: Tracie Harrier, MD  DOB: 27-Jan-1951  PCP: Tracie Harrier, MD  DOS: 12/27/2017  Note by: Gaspar Cola, MD  Service setting: Ambulatory outpatient  Specialty: Interventional Pain Management  Patient type: Established  Location: [name] (AMB) Pain Management Facility  Visit type: Interventional Procedure   Primary Reason for Visit: Interventional Pain Management Treatment. CC: Back Pain (lower, both sides, worse on left side)  Procedure:          Anesthesia, Analgesia, Anxiolysis:  Type: Lumbar Facet, Medial Branch Block(s) #1  Primary Purpose: Diagnostic Region: Posterolateral Lumbosacral Spine Level: L2, L3, L4, L5, & S1 Medial Branch Level(s). Injecting these levels blocks the L3-4, L4-5, and L5-S1 lumbar facet joints. Laterality: Bilateral  Type: Moderate (Conscious) Sedation combined with Local Anesthesia Indication(s): Analgesia and Anxiety Route: Intravenous (IV) IV Access: Secured Sedation: Meaningful verbal contact was maintained at all times during the procedure  Local Anesthetic: Lidocaine 1-2%   Indications: 1. Spondylosis without myelopathy or radiculopathy, lumbosacral region   2. Lumbar facet syndrome   3. Chronic Lumbar facet arthropathy (L3-4, L4-5, and L5-S1) (Bilateral)   4. Chronic low back pain (Primary Area of Pain) (Bilateral)    Pain Score: Pre-procedure: 9 /10 Post-procedure: 1 /10  Pre-op Assessment:  Kara [name] is a 67 y.o. (year old), female patient, seen today for interventional treatment. She  has a past surgical history that includes Knee surgery; Carpal tunnel release (Bilateral); Colon resection sigmoid (N/A, 11/02/2016); Colostomy (N/A, 11/02/2016); Sigmoidoscopy (N/A, 11/13/2016); PULMONARY VENOGRAPHY (N/A, 11/19/2016); Colonoscopy with propofol (N/A, 02/03/2017); Colon surgery (11/02/2016); IVC FILTER INSERTION (Right, 10/2016); Colostomy reversal (N/A,  02/24/2017); Appendectomy (N/A, 02/24/2017); Lysis of adhesion (N/A, 02/24/2017); laparoscopy (N/A, 02/24/2017); Ileo loop diversion (N/A, 02/24/2017); Sigmoidoscopy (N/A, 05/19/2017); Ileostomy closure (N/A, 06/01/2017); IVC FILTER REMOVAL (N/A, 08/09/2017); and Sacroplasty (N/A, 11/08/2017). Kara [name] has a current medication list which includes the following prescription(s): aspirin ec, candesartan, cyclobenzaprine, gabapentin, glipizide, multi-vitamins, and oxycodone-acetaminophen, and the following Facility-Administered Medications: midazolam. Her primarily concern today is the Back Pain (lower, both sides, worse on left side)  Initial Vital Signs:  Pulse/HCG Rate: 73ECG Heart Rate: 98 Temp: (!) 97.5 F (36.4 C) Resp: 18 BP: (!) 157/76 SpO2: 96 %  BMI: Estimated body mass index is 38.44 kg/m as calculated from the following:   Height as of this encounter: 5\' 3"  (1.6 m).   Weight as of this encounter: 217 lb (98.4 kg).  Risk Assessment: Allergies: Reviewed. She is allergic to adhesive [tape]; morphine; other; latex; and morphine and related.  Allergy Precautions: Latex-free protocol activated Coagulopathies: Reviewed. None identified.  Blood-thinner therapy: None at this time Active Infection(s): Reviewed. None identified. Kara [name] is afebrile  Site Confirmation: Kara [name] was asked to confirm the procedure and laterality before marking the site Procedure checklist: Completed Consent: Before the procedure and under the influence of no sedative(s), amnesic(s), or anxiolytics, the patient was informed of the treatment options, risks and possible complications. To fulfill our ethical and legal obligations, as recommended by the American Medical Association's Code of Ethics, I have informed the patient of my clinical impression; the nature and purpose of the treatment or procedure; the risks, benefits, and possible complications of the intervention; the alternatives,  including doing nothing; the risk(s) and benefit(s) of the alternative treatment(s) or procedure(s); and the risk(s) and benefit(s) of doing nothing. The patient was provided information about the general risks and possible complications associated with the procedure. These may include, but  are not limited to: failure to achieve desired goals, infection, bleeding, organ or nerve damage, allergic reactions, paralysis, and death. In addition, the patient was informed of those risks and complications associated to Spine-related procedures, such as failure to decrease pain; infection (i.e.: Meningitis, epidural or intraspinal abscess); bleeding (i.e.: epidural hematoma, subarachnoid hemorrhage, or any other type of intraspinal or peri-dural bleeding); organ or nerve damage (i.e.: Any type of peripheral nerve, nerve root, or spinal cord injury) with subsequent damage to sensory, motor, and/or autonomic systems, resulting in permanent pain, numbness, and/or weakness of one or several areas of the body; allergic reactions; (i.e.: anaphylactic reaction); and/or death. Furthermore, the patient was informed of those risks and complications associated with the medications. These include, but are not limited to: allergic reactions (i.e.: anaphylactic or anaphylactoid reaction(s)); adrenal axis suppression; blood sugar elevation that in diabetics may result in ketoacidosis or comma; water retention that in patients with history of congestive heart failure may result in shortness of breath, pulmonary edema, and decompensation with resultant heart failure; weight gain; swelling or edema; medication-induced neural toxicity; particulate matter embolism and blood vessel occlusion with resultant organ, and/or nervous system infarction; and/or aseptic necrosis of one or more joints. Finally, the patient was informed that Medicine is not an exact science; therefore, there is also the possibility of unforeseen or unpredictable risks  and/or possible complications that may result in a catastrophic outcome. The patient indicated having understood very clearly. We have given the patient no guarantees and we have made no promises. Enough time was given to the patient to ask questions, all of which were answered to the patient's satisfaction. Kara [name] has indicated that she wanted to continue with the procedure. Attestation: I, the ordering provider, attest that I have discussed with the patient the benefits, risks, side-effects, alternatives, likelihood of achieving goals, and potential problems during recovery for the procedure that I have provided informed consent. Date  Time: 12/27/2017  9:14 AM  Pre-Procedure Preparation:  Monitoring: As per clinic protocol. Respiration, ETCO2, SpO2, BP, heart rate and rhythm monitor placed and checked for adequate function Safety Precautions: Patient was assessed for positional comfort and pressure points before starting the procedure. Time-out: I initiated and conducted the "Time-out" before starting the procedure, as per protocol. The patient was asked to participate by confirming the accuracy of the "Time Out" information. Verification of the correct person, site, and procedure were performed and confirmed by me, the nursing staff, and the patient. "Time-out" conducted as per Joint Commission's Universal Protocol (UP.01.01.01). Time: 0947  Description of Procedure:          Position: Prone Laterality: Bilateral. The procedure was performed in identical fashion on both sides. Levels:  L2, L3, L4, L5, & S1 Medial Branch Level(s) Area Prepped: Posterior Lumbosacral Region Prepping solution: ChloraPrep (2% chlorhexidine gluconate and 70% isopropyl alcohol) Safety Precautions: Aspiration looking for blood return was conducted prior to all injections. At no point did we inject any substances, as a needle was being advanced. Before injecting, the patient was told to immediately notify me  if she was experiencing any new onset of "ringing in the ears, or metallic taste in the mouth". No attempts were made at seeking any paresthesias. Safe injection practices and needle disposal techniques used. Medications properly checked for expiration dates. SDV (single dose vial) medications used. After the completion of the procedure, all disposable equipment used was discarded in the proper designated medical waste containers. Local Anesthesia: Protocol guidelines were followed. The patient was  positioned over the fluoroscopy table. The area was prepped in the usual manner. The time-out was completed. The target area was identified using fluoroscopy. A 12-in long, straight, sterile hemostat was used with fluoroscopic guidance to locate the targets for each level blocked. Once located, the skin was marked with an approved surgical skin marker. Once all sites were marked, the skin (epidermis, dermis, and hypodermis), as well as deeper tissues (fat, connective tissue and muscle) were infiltrated with a small amount of a short-acting local anesthetic, loaded on a 10cc syringe with a 25G, 1.5-in  Needle. An appropriate amount of time was allowed for local anesthetics to take effect before proceeding to the next step. Local Anesthetic: Lidocaine 2.0% The unused portion of the local anesthetic was discarded in the proper designated containers. Technical explanation of process:  L2 Medial Branch Nerve Block (MBB): The target area for the L2 medial branch is at the junction of the postero-lateral aspect of the superior articular process and the superior, posterior, and medial edge of the transverse process of L3. Under fluoroscopic guidance, a Quincke needle was inserted until contact was made with os over the superior postero-lateral aspect of the pedicular shadow (target area). After negative aspiration for blood, 0.5 mL of the nerve block solution was injected without difficulty or complication. The needle was  removed intact. L3 Medial Branch Nerve Block (MBB): The target area for the L3 medial branch is at the junction of the postero-lateral aspect of the superior articular process and the superior, posterior, and medial edge of the transverse process of L4. Under fluoroscopic guidance, a Quincke needle was inserted until contact was made with os over the superior postero-lateral aspect of the pedicular shadow (target area). After negative aspiration for blood, 0.5 mL of the nerve block solution was injected without difficulty or complication. The needle was removed intact. L4 Medial Branch Nerve Block (MBB): The target area for the L4 medial branch is at the junction of the postero-lateral aspect of the superior articular process and the superior, posterior, and medial edge of the transverse process of L5. Under fluoroscopic guidance, a Quincke needle was inserted until contact was made with os over the superior postero-lateral aspect of the pedicular shadow (target area). After negative aspiration for blood, 0.5 mL of the nerve block solution was injected without difficulty or complication. The needle was removed intact. L5 Medial Branch Nerve Block (MBB): The target area for the L5 medial branch is at the junction of the postero-lateral aspect of the superior articular process and the superior, posterior, and medial edge of the sacral ala. Under fluoroscopic guidance, a Quincke needle was inserted until contact was made with os over the superior postero-lateral aspect of the pedicular shadow (target area). After negative aspiration for blood, 0.5 mL of the nerve block solution was injected without difficulty or complication. The needle was removed intact. S1 Medial Branch Nerve Block (MBB): The target area for the S1 medial branch is at the posterior and inferior 6 o'clock position of the L5-S1 facet joint. Under fluoroscopic guidance, the Quincke needle inserted for the L5 MBB was redirected until contact was  made with os over the inferior and postero aspect of the sacrum, at the 6 o' clock position under the L5-S1 facet joint (Target area). After negative aspiration for blood, 0.5 mL of the nerve block solution was injected without difficulty or complication. The needle was removed intact. Procedural Needles: 22-gauge, 7-inch, Quincke needles used for all levels. Nerve block solution: 0.2% PF-Ropivacaine +  Triamcinolone (40 mg/mL) diluted to a final concentration of 4 mg of Triamcinolone/mL of Ropivacaine The unused portion of the solution was discarded in the proper designated containers.  Once the entire procedure was completed, the treated area was cleaned, making sure to leave some of the prepping solution back to take advantage of its long term bactericidal properties.   Illustration of the posterior view of the lumbar spine and the posterior neural structures. Laminae of L2 through S1 are labeled. DPRL5, dorsal primary ramus of L5; DPRS1, dorsal primary ramus of S1; DPR3, dorsal primary ramus of L3; FJ, facet (zygapophyseal) joint L3-L4; I, inferior articular process of L4; LB1, lateral branch of dorsal primary ramus of L1; IAB, inferior articular branches from L3 medial branch (supplies L4-L5 facet joint); IBP, intermediate branch plexus; MB3, medial branch of dorsal primary ramus of L3; NR3, third lumbar nerve root; S, superior articular process of L5; SAB, superior articular branches from L4 (supplies L4-5 facet joint also); TP3, transverse process of L3.  Vitals:   12/27/17 1014 12/27/17 1019 12/27/17 1029 12/27/17 1039  BP: 131/68 133/73 136/72 115/69  Pulse:      Resp: (!) 8 20 11  (!) 24  Temp: 97.6 F (36.4 C)   97.6 F (36.4 C)  TempSrc: Temporal     SpO2: 100% 95% 99% 100%  Weight:      Height:        Start Time: 0947 hrs. End Time: 1001 hrs.  Imaging Guidance (Spinal):          Type of Imaging Technique: Fluoroscopy Guidance (Spinal) Indication(s): Assistance in needle guidance  and placement for procedures requiring needle placement in or near specific anatomical locations not easily accessible without such assistance. Exposure Time: Please see nurses notes. Contrast: None used. Fluoroscopic Guidance: I was personally present during the use of fluoroscopy. "Tunnel Vision Technique" used to obtain the best possible view of the target area. Parallax error corrected before commencing the procedure. "Direction-depth-direction" technique used to introduce the needle under continuous pulsed fluoroscopy. Once target was reached, antero-posterior, oblique, and lateral fluoroscopic projection used confirm needle placement in all planes. Images permanently stored in EMR. Interpretation: No contrast injected. I personally interpreted the imaging intraoperatively. Adequate needle placement confirmed in multiple planes. Permanent images saved into the patient's record.  Antibiotic Prophylaxis:   Anti-infectives (From admission, onward)   None     Indication(s): None identified  Post-operative Assessment:  Post-procedure Vital Signs:  Pulse/HCG Rate: 7376 Temp: 97.6 F (36.4 C) Resp: (!) 24 BP: 115/69 SpO2: 100 %  EBL: None  Complications: No immediate post-treatment complications observed by team, or reported by patient.  Note: The patient tolerated the entire procedure well. A repeat set of vitals were taken after the procedure and the patient was kept under observation following institutional policy, for this type of procedure. Post-procedural neurological assessment was performed, showing return to baseline, prior to discharge. The patient was provided with post-procedure discharge instructions, including a section on how to identify potential problems. Should any problems arise concerning this procedure, the patient was given instructions to immediately contact us, at any time, without hesitation. In any case, we plan to contact the patient by telephone for a follow-up  status report regarding this interventional procedure.  Comments:  No additional relevant information.  Plan of Care    Imaging Orders     DG C-Arm 1-60 Min-No Report  Procedure Orders     LUMBAR FACET(MEDIAL BRANCH NERVE BLOCK) MBNB  Medications ordered for  procedure: Meds ordered this encounter  Medications  . lidocaine (XYLOCAINE) 2 % (with pres) injection 400 mg  . midazolam (VERSED) 5 MG/5ML injection 1-2 mg    Make sure Flumazenil is available in the pyxis when using this medication. If oversedation occurs, administer 0.2 mg IV over 15 sec. If after 45 sec no response, administer 0.2 mg again over 1 min; may repeat at 1 min intervals; not to exceed 4 doses (1 mg)  . DISCONTD: fentaNYL (SUBLIMAZE) injection 25-50 mcg    Make sure Narcan is available in the pyxis when using this medication. In the event of respiratory depression (RR< 8/min): Titrate NARCAN (naloxone) in increments of 0.1 to 0.2 mg IV at 2-3 minute intervals, until desired degree of reversal.  . lactated ringers infusion 1,000 mL  . ropivacaine (PF) 2 mg/mL (0.2%) (NAROPIN) injection 18 mL  . triamcinolone acetonide (KENALOG-40) injection 80 mg   Medications administered: We administered lidocaine, midazolam, lactated ringers, ropivacaine (PF) 2 mg/mL (0.2%), and triamcinolone acetonide.  See the medical record for exact dosing, route, and time of administration.  New Prescriptions   No medications on file   Disposition: Discharge home  Discharge Date & Time: 12/27/2017; 1042 hrs.   Physician-requested Follow-up: Return for post-procedure eval (2 wks), w/ Dr. Dossie Arbour.  Future Appointments  Date Time Provider [place]  01/18/2018  1:30 PM Milinda Pointer, MD [facility] None   Primary Care Physician: Tracie Harrier, MD Location: [facility] Note by: Gaspar Cola, MD Date: 12/27/2017; Time: 12:26 PM  Disclaimer:  Medicine is not an exact science. The only  guarantee in medicine is that nothing is guaranteed. It is important to note that the decision to proceed with this intervention was based on the information collected from the patient. The Data and conclusions were drawn from the patient's questionnaire, the interview, and the physical examination. Because the information was provided in large part by the patient, it cannot be guaranteed that it has not been purposely or unconsciously manipulated. Every effort has been made to obtain as much relevant data as possible for this evaluation. It is important to note that the conclusions that lead to this procedure are derived in large part from the available data. Always take into account that the treatment will also be dependent on availability of resources and existing treatment guidelines, considered by other Pain Management Practitioners as being common knowledge and practice, at the time of the intervention. For Medico-Legal purposes, it is also important to point out that variation in procedural techniques and pharmacological choices are the acceptable norm. The indications, contraindications, technique, and results of the above procedure should only be interpreted and judged by a Board-Certified Interventional Pain Specialist with extensive familiarity and expertise in the same exact procedure and technique.

## 2017-12-27 NOTE — Patient Instructions (Signed)

## 2017-12-28 ENCOUNTER — Telehealth: Payer: Self-pay

## 2017-12-28 NOTE — Telephone Encounter (Signed)
Post procedure phone call.  Patient states she is doing OK

## 2018-01-17 NOTE — Progress Notes (Signed)
Patient's Name: Kara [name]  MRN: [id number]  Referring Provider: Tracie Harrier, MD  DOB: Feb 28, 1951  PCP: Tracie Harrier, MD  DOS: 01/18/2018  Note by: Gaspar Cola, MD  Service setting: Ambulatory outpatient  Specialty: Interventional Pain Management  Location: [name] (AMB) Pain Management Facility    Patient type: Established   Primary Reason(s) for Visit: Encounter for post-procedure evaluation of chronic illness with mild to moderate exacerbation CC: Back Pain (low)  HPI  Kara [name] is a 67 y.o. year old, female patient, who comes today for a post-procedure evaluation. She has Diabetes mellitus type 2 in obese ([place]); Type 2 diabetes mellitus without complication, without long-term current use of insulin ([place]); BP (high blood pressure); Nocturnal leg cramps; Cough; Chronic hip pain (Left); Acute non-recurrent maxillary sinusitis; Diverticulitis large intestine; Diverticulitis; DDD (degenerative disc disease), lumbar; Benign essential hypertension; Edema of both legs; Morbidly obese ([place]); Acute on chronic systolic (congestive) heart failure ([place]); HCAP (healthcare-associated pneumonia); Encounter for removal of staples; Other pulmonary embolism without acute cor pulmonale ([place]); History of colostomy reversal; Ileostomy in place [facility]); Positive result for methicillin resistant Staphylococcus aureus (MRSA) screening; H/O ileostomy; Diverticulosis of colon; History of pulmonary embolism; S/P IVC filter; Chronic low back pain (Primary Area of Pain) (Bilateral) w/ sciatica (Bilateral); Chronic sacroiliac joint pain (Secondary Area of Pain) (Right); Chronic pain syndrome; Long term current use of opiate analgesic; Pharmacologic therapy; Disorder of skeletal system; Problems influencing health status; Hyponatremia; Hypocalcemia; Sacral insufficiency fracture; 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-S1 anterolisthesis.; Lumbar foraminal stenosis (L3-4 and L4-5) (Bilateral); Lumbosacral  L5-S1 subarticular lateral recess stenosis (Bilateral); Lumbar facet arthropathy (L3-4, L4-5, and L5-S1) (Bilateral); Lumbar facet syndrome (Bilateral); 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 Her primarily concern today is the Back Pain (low)  Pain Assessment: Location: Lower Back Radiating: denies Onset: More than a month ago Duration: Chronic pain Quality: Dull, Constant Severity: 9 /10 (subjective, self-reported pain score)  Note: Reported level is inconsistent with clinical observations. Clinically the patient looks like a 3/10 A 3/10 is viewed as "Moderate" and described as significantly interfering with activities of daily living (ADL). It becomes difficult to feed, bathe, get dressed, get on and off the toilet or to perform personal hygiene functions. Difficult to get in and out of bed or a chair without assistance. Very distracting. With effort, it can be ignored when deeply involved in activities. Information on the proper use of the pain scale provided to the patient today. When using our objective Pain Scale, levels between 6 and 10/10 are said to belong in an emergency room, as it progressively worsens from a 6/10, described as severely limiting, requiring emergency care not usually available at an outpatient pain management facility. At a 6/10 level, communication becomes difficult and requires great effort. Assistance to reach the emergency department may be required. Facial flushing and profuse sweating along  with potentially dangerous increases in heart rate and blood pressure will be evident. Effect on ADL: "I cant stand very long"  Timing: Constant Modifying factors: lying down, sitting in recliner with ice on it BP: 118/69  HR: 85  Kara [name] comes  in today for post-procedure evaluation after the treatment done on 12/28/2017.  Further details on both, my assessment(s), as well as the proposed treatment plan, please see below.  Post-Procedure Assessment  12/27/2017 Procedure: Diagnostic bilateral lumbar facet block #1 under fluoroscopic guidance and IV sedation Pre-procedure pain score:        /10 Post-procedure pain score: 0/10         Influential Factors: BMI: 38.62 kg/m Intra-procedural challenges: None observed.         Assessment challenges: None detected.              Reported side-effects: None.        Post-procedural adverse reactions or complications: None reported         Sedation: Please see nurses note. When no sedatives are used, the analgesic levels obtained are directly associated to the effectiveness of the local anesthetics. However, when sedation is provided, the level of analgesia obtained during the initial 1 hour following the intervention, is believed to be the result of a combination of factors. These factors may include, but are not limited to: 1. The effectiveness of the local anesthetics used. 2. The effects of the analgesic(s) and/or anxiolytic(s) used. 3. The degree of discomfort experienced by the patient at the time of the procedure. 4. The patients ability and reliability in recalling and recording the events. 5. The presence and influence of possible secondary gains and/or psychosocial factors. Reported result: Relief experienced during the 1st hour after the procedure: 100 % (Ultra-Short Term Relief) Kara [name] has indicated area to have been numb during this time. Interpretative annotation: Clinically appropriate result. Analgesia during  this period is likely to be Local Anesthetic and/or IV Sedative (Analgesic/Anxiolytic) related.          Effects of local anesthetic: The analgesic effects attained during this period are directly associated to the localized infiltration of local anesthetics and therefore cary significant diagnostic value as to the etiological location, or anatomical origin, of the pain. Expected duration of relief is directly dependent on the pharmacodynamics of the local anesthetic used. Long-acting (4-6 hours) anesthetics used.  Reported result: Relief during the next 4 to 6 hour after the procedure: 100 % (Short-Term Relief) Ms. Bayles has indicated area to have been numb during this time. Interpretative annotation: Clinically appropriate result. Analgesia during this period is likely to be Local Anesthetic-related.          Long-term benefit: Defined as the period of time past the expected duration of local anesthetics (1 hour for short-acting and 4-6 hours for long-acting). With the possible exception of prolonged sympathetic blockade from the local anesthetics, benefits during this period are typically attributed to, or associated with, other factors such as analgesic sensory neuropraxia, antiinflammatory effects, or beneficial biochemical changes provided by agents other than the local anesthetics.  Reported result: Extended relief following procedure: 85 %(lasting 20 days until started moving and bending over. ) (Long-Term Relief)            Interpretative annotation: Clinically possible results. Good relief. No permanent benefit expected. Inflammation plays a part in the etiology to the pain.          Current benefits: Defined as reported results that persistent at this point in time.   Analgesia: <50 %            Function: Somewhat improved ROM: Somewhat improved Interpretative annotation: Recurrence of symptoms. Therapeutic benefit observed. Effective therapeutic approach.  Interpretation: Results would suggest a successful diagnostic intervention.                  Plan:  Proceed with treatment No.: 2          Laboratory Chemistry  Inflammation Markers (CRP: Acute Phase) (ESR: Chronic Phase) Lab Results  Component Value Date   CRP 5.3 (H) 11/02/2017   ESRSEDRATE 12 11/02/2017                         Renal Markers Lab Results  Component Value Date   BUN 19 10/03/2017   CREATININE 0.63 10/03/2017   GFRAA >60 10/03/2017   GFRNONAA >60 10/03/2017                             Hepatic Markers Lab Results  Component Value Date   AST 40 04/21/2017   ALT 53 04/21/2017   ALBUMIN 3.8 04/21/2017                        Neuropathy Markers Lab Results  Component Value Date   VITAMINB12 367 11/02/2017   HGBA1C 7.2 (H) 02/17/2017   HIV Non Reactive 11/18/2016                        Hematology Parameters Lab Results  Component Value Date   PLT 200 10/03/2017   HGB 16.5 (H) 10/03/2017   HCT 49.7 (H) 10/03/2017                        CV Markers Lab Results  Component Value Date   BNP 60.0 11/17/2016   CKTOTAL 71 10/02/2011   CKMB < 0.5 (L) 10/02/2011   TROPONINI <0.03 11/17/2016                         Note: Lab results reviewed.  Recent Imaging Results   Results for orders placed in visit on 12/27/17  DG C-Arm 1-60 Min-No Report   Narrative Fluoroscopy was utilized by the requesting physician.  No radiographic  interpretation.    Interpretation Report: Fluoroscopy was used during the procedure to assist with needle guidance. The images were interpreted intraoperatively by the requesting physician.  Meds   Current Outpatient Medications:  .  aspirin EC 81 MG tablet, Take 81 mg by mouth daily., Disp: , Rfl:  .  candesartan (ATACAND) 16 MG tablet, Take 8 mg by mouth daily. , Disp: , Rfl:  .  cyclobenzaprine (FLEXERIL) 10 MG tablet, Take 10 mg by mouth at bedtime. , Disp: , Rfl:  .  gabapentin (NEURONTIN) 100 MG capsule, Take 200 mg  by mouth 3 (three) times daily. , Disp: , Rfl:  .  glipiZIDE (GLUCOTROL XL) 5 MG 24 hr tablet, Take 5 mg by mouth daily with breakfast. , Disp: , Rfl:  .  Multiple Vitamin (MULTI-VITAMINS) TABS, Take 1 tablet by mouth daily., Disp: , Rfl:  .  meclizine (ANTIVERT) 25 MG tablet, , Disp: , Rfl:   ROS  Constitutional: Denies any fever or chills Gastrointestinal: No reported hemesis, hematochezia, vomiting, or acute GI distress Musculoskeletal: Denies any acute onset joint swelling, redness, loss of ROM, or weakness Neurological: No reported episodes of acute onset apraxia, aphasia, dysarthria, agnosia, amnesia, paralysis, loss of coordination, or loss of consciousness  Allergies  Ms.  Jackson-Markie is allergic to adhesive [tape]; morphine; other; latex; and morphine and related.  PFSH  Drug: Ms. Woolridge  reports that she does not use drugs. Alcohol:  reports that she does not drink alcohol. Tobacco:  reports that she has never smoked. She has never used smokeless tobacco. 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 Surgical: Ms. Loss  has a past surgical history that includes Knee surgery; Carpal tunnel release (Bilateral); Colon resection sigmoid (N/A, 11/02/2016); Colostomy (N/A, 11/02/2016); Sigmoidoscopy (N/A, 11/13/2016); PULMONARY VENOGRAPHY (N/A, 11/19/2016); Colonoscopy with propofol (N/A, 02/03/2017); Colon surgery (11/02/2016); IVC FILTER INSERTION (Right, 10/2016); Colostomy reversal (N/A, 02/24/2017); Appendectomy (N/A, 02/24/2017); Lysis of adhesion (N/A, 02/24/2017); laparoscopy (N/A, 02/24/2017); Ileo loop diversion (N/A, 02/24/2017); Sigmoidoscopy (N/A, 05/19/2017); Ileostomy closure (N/A, 06/01/2017); IVC FILTER REMOVAL (N/A, 08/09/2017); and Sacroplasty (N/A, 11/08/2017). Family:  family history includes Breast cancer in her cousin and mother; Cancer in her mother; Diabetes in her mother and sister; Heart disease in her father, mother, and sister.  Constitutional Exam  General appearance: Well nourished, well developed, and well hydrated. In no apparent acute distress Vitals:   01/18/18 1257  BP: 118/69  Pulse: 85  Resp: 18  Temp: 98.7 F (37.1 C)  SpO2: 96%  Weight: 218 lb (98.9 kg)  Height: '5\' 3"'  (1.6 m)   BMI Assessment: Estimated body mass index is 38.62 kg/m as calculated from the following:   Height as of this encounter: '5\' 3"'  (1.6 m).   Weight as of this encounter: 218 lb (98.9 kg).  BMI interpretation table: BMI level Category Range association with higher incidence of chronic pain  <18 kg/m2 Underweight   18.5-24.9 kg/m2 Ideal body weight   25-29.9 kg/m2 Overweight Increased incidence by 20%  30-34.9 kg/m2 Obese (Class I) Increased incidence by 68%  35-39.9 kg/m2 Severe obesity (Class II) Increased incidence by 136%  >40 kg/m2 Extreme obesity (Class III) Increased incidence by 254%   Patient's current BMI Ideal Body weight  Body mass index is 38.62 kg/m. Ideal body weight: 52.4 kg (115 lb 8.3 oz) Adjusted ideal body weight: 71 kg (156 lb 8.2 oz)   BMI Readings from Last 4 Encounters:  01/18/18 38.62 kg/m  12/27/17 38.44 kg/m  12/14/17 40.03 kg/m  11/17/17 40.18 kg/m   Wt Readings from Last 4 Encounters:  01/18/18 218 lb (98.9 kg)  12/27/17 217 lb (98.4 kg)  12/14/17 226 lb (102.5 kg)  11/17/17 226 lb 12.8 oz (102.9 kg)  Psych/Mental status: Alert, oriented x 3 (person, place, & time)       Eyes: PERLA Respiratory: No evidence of acute respiratory distress  Cervical Spine Area Exam  Skin & Axial Inspection: No masses, redness, edema, swelling, or associated skin lesions Alignment: Symmetrical Functional ROM: Unrestricted ROM      Stability: No instability detected Muscle Tone/Strength: Functionally intact. No obvious  neuro-muscular anomalies detected. Sensory (Neurological): Unimpaired Palpation: No palpable anomalies              Upper Extremity (UE) Exam    Side: Right upper extremity  Side: Left upper extremity  Skin & Extremity Inspection: Skin color, temperature, and hair growth are WNL. No peripheral edema or cyanosis. No masses, redness, swelling, asymmetry, or associated skin lesions. No contractures.  Skin & Extremity Inspection: Skin color, temperature, and hair growth are WNL. No peripheral edema or cyanosis. No masses, redness, swelling, asymmetry,  or associated skin lesions. No contractures.  Functional ROM: Unrestricted ROM          Functional ROM: Unrestricted ROM          Muscle Tone/Strength: Functionally intact. No obvious neuro-muscular anomalies detected.  Muscle Tone/Strength: Functionally intact. No obvious neuro-muscular anomalies detected.  Sensory (Neurological): Unimpaired          Sensory (Neurological): Unimpaired          Palpation: No palpable anomalies              Palpation: No palpable anomalies              Provocative Test(s):  Phalen's test: deferred Tinel's test: deferred Apley's scratch test (touch opposite shoulder):  Action 1 (Across chest): deferred Action 2 (Overhead): deferred Action 3 (LB reach): deferred   Provocative Test(s):  Phalen's test: deferred Tinel's test: deferred Apley's scratch test (touch opposite shoulder):  Action 1 (Across chest): deferred Action 2 (Overhead): deferred Action 3 (LB reach): deferred    Thoracic Spine Area Exam  Skin & Axial Inspection: No masses, redness, or swelling Alignment: Symmetrical Functional ROM: Unrestricted ROM Stability: No instability detected Muscle Tone/Strength: Functionally intact. No obvious neuro-muscular anomalies detected. Sensory (Neurological): Unimpaired Muscle strength & Tone: No palpable anomalies  Lumbar Spine Area Exam  Skin & Axial Inspection: No masses, redness, or swelling Alignment:  Symmetrical Functional ROM: Decreased ROM       Stability: No instability detected Muscle Tone/Strength: Functionally intact. No obvious neuro-muscular anomalies detected. Sensory (Neurological): Unimpaired Palpation: No palpable anomalies       Provocative Tests: Hyperextension/rotation test: deferred today       Lumbar quadrant test (Kemp's test): deferred today       Lateral bending test: deferred today       Patrick's Maneuver: deferred today                   FABER test: deferred today                   S-I anterior distraction/compression test: deferred today         S-I lateral compression test: deferred today         S-I Thigh-thrust test: deferred today         S-I Gaenslen's test: deferred today          Gait & Posture Assessment  Ambulation: Patient ambulates using a wheel chair Gait: Limited. Using assistive device to ambulate Posture: Antalgic   Lower Extremity Exam    Side: Right lower extremity  Side: Left lower extremity  Stability: No instability observed          Stability: No instability observed          Skin & Extremity Inspection: Skin color, temperature, and hair growth are WNL. No peripheral edema or cyanosis. No masses, redness, swelling, asymmetry, or associated skin lesions. No contractures.  Skin & Extremity Inspection: Skin color, temperature, and hair growth are WNL. No peripheral edema or cyanosis. No masses, redness, swelling, asymmetry, or associated skin lesions. No contractures.  Functional ROM: Unrestricted ROM                  Functional ROM: Unrestricted ROM                  Muscle Tone/Strength: Functionally intact. No obvious neuro-muscular anomalies detected.  Muscle Tone/Strength: Functionally intact. No obvious neuro-muscular anomalies detected.  Sensory (Neurological): Unimpaired  Sensory (Neurological): Unimpaired  Palpation: No palpable anomalies  Palpation: No palpable anomalies   Assessment  Primary Diagnosis & Pertinent Problem  List: The primary encounter diagnosis was Spondylosis without myelopathy or radiculopathy, lumbosacral region. Diagnoses of Lumbar facet arthropathy (L3-4, L4-5, and L5-S1) (Bilateral), Lumbar facet syndrome (Bilateral), and Chronic low back pain (Primary Area of Pain) (Bilateral) were also pertinent to this visit.  Status Diagnosis  Stable Stable Improving 1. Spondylosis without myelopathy or radiculopathy, lumbosacral region   2. Lumbar facet arthropathy (L3-4, L4-5, and L5-S1) (Bilateral)   3. Lumbar facet syndrome (Bilateral)   4. Chronic low back pain (Primary Area of Pain) (Bilateral)     Problems updated and reviewed during this visit: No problems updated. Plan of Care  Pharmacotherapy (Medications Ordered): No orders of the defined types were placed in this encounter.  Medications administered today: [name] had no medications administered during this visit.   Procedure Orders     LUMBAR FACET(MEDIAL BRANCH NERVE BLOCK) [facility] Lab Orders  No laboratory test(s) ordered today   Imaging Orders  No imaging studies ordered today   Referral Orders  No referral(s) requested today    Interventional management options: Planned, scheduled, and/or pending:   Diagnostic bilateral lumbar facet block #2 under fluoroscopic guidance and IV sedation   Considering:   Diagnostic bilateral lumbar facet block Possible bilateral lumbar facet RFA Diagnostic L5-S1 LESI Diagnostic bilateral L3transforaminal ESI Diagnostic bilateral L4transforaminal ESI Diagnostic bilateral L5 transforaminal ESI Diagnostic right-sided sacroiliac joint block Possible right-sided sacroiliac joint RFA Diagnostic bilateral lumbar facet block Possible bilateral lumbar facet RFA Diagnostic right-sided intra-articular hip joint injection Diagnostic right-sided femoral nerve +Obturatornerve block Possible right-sided femoral nerve + obturator RFA   Palliative PRN  treatment(s):   None at this time   Provider-requested follow-up: Return for Procedure (w/ sedation): (B) L-FCT BLK #2.  Future Appointments  Date Time Provider [place]  01/26/2018  8:30 AM Milinda Pointer, MD [facility] None   Primary Care Physician: Tracie Harrier, MD Location: [facility] Outpatient Pain Management Facility Note by: Gaspar Cola, MD Date: 01/18/2018; Time: 7:28 PM

## 2018-01-18 ENCOUNTER — Ambulatory Visit: Payer: PPO | Attending: Pain Medicine | Admitting: Pain Medicine

## 2018-01-18 ENCOUNTER — Encounter: Payer: Self-pay | Admitting: Pain Medicine

## 2018-01-18 ENCOUNTER — Other Ambulatory Visit: Payer: Self-pay

## 2018-01-18 VITALS — BP 118/69 | HR 85 | Temp 98.7°F | Resp 18 | Ht 63.0 in | Wt 218.0 lb

## 2018-01-18 DIAGNOSIS — Z79891 Long term (current) use of opiate analgesic: Secondary | ICD-10-CM | POA: Diagnosis not present

## 2018-01-18 DIAGNOSIS — G8929 Other chronic pain: Secondary | ICD-10-CM | POA: Diagnosis not present

## 2018-01-18 DIAGNOSIS — M5136 Other intervertebral disc degeneration, lumbar region: Secondary | ICD-10-CM | POA: Insufficient documentation

## 2018-01-18 DIAGNOSIS — M48061 Spinal stenosis, lumbar region without neurogenic claudication: Secondary | ICD-10-CM | POA: Diagnosis not present

## 2018-01-18 DIAGNOSIS — M545 Low back pain: Secondary | ICD-10-CM

## 2018-01-18 DIAGNOSIS — M1611 Unilateral primary osteoarthritis, right hip: Secondary | ICD-10-CM | POA: Diagnosis not present

## 2018-01-18 DIAGNOSIS — I5023 Acute on chronic systolic (congestive) heart failure: Secondary | ICD-10-CM | POA: Insufficient documentation

## 2018-01-18 DIAGNOSIS — Z79899 Other long term (current) drug therapy: Secondary | ICD-10-CM | POA: Diagnosis not present

## 2018-01-18 DIAGNOSIS — Z885 Allergy status to narcotic agent status: Secondary | ICD-10-CM | POA: Diagnosis not present

## 2018-01-18 DIAGNOSIS — M47817 Spondylosis without myelopathy or radiculopathy, lumbosacral region: Secondary | ICD-10-CM | POA: Diagnosis not present

## 2018-01-18 DIAGNOSIS — G894 Chronic pain syndrome: Secondary | ICD-10-CM | POA: Diagnosis not present

## 2018-01-18 DIAGNOSIS — E871 Hypo-osmolality and hyponatremia: Secondary | ICD-10-CM | POA: Insufficient documentation

## 2018-01-18 DIAGNOSIS — M533 Sacrococcygeal disorders, not elsewhere classified: Secondary | ICD-10-CM | POA: Insufficient documentation

## 2018-01-18 DIAGNOSIS — E119 Type 2 diabetes mellitus without complications: Secondary | ICD-10-CM | POA: Insufficient documentation

## 2018-01-18 DIAGNOSIS — K5730 Diverticulosis of large intestine without perforation or abscess without bleeding: Secondary | ICD-10-CM | POA: Insufficient documentation

## 2018-01-18 DIAGNOSIS — Z9889 Other specified postprocedural states: Secondary | ICD-10-CM | POA: Diagnosis not present

## 2018-01-18 DIAGNOSIS — M47816 Spondylosis without myelopathy or radiculopathy, lumbar region: Secondary | ICD-10-CM | POA: Diagnosis not present

## 2018-01-18 DIAGNOSIS — E1142 Type 2 diabetes mellitus with diabetic polyneuropathy: Secondary | ICD-10-CM | POA: Insufficient documentation

## 2018-01-18 DIAGNOSIS — Z7982 Long term (current) use of aspirin: Secondary | ICD-10-CM | POA: Diagnosis not present

## 2018-01-18 DIAGNOSIS — I110 Hypertensive heart disease with heart failure: Secondary | ICD-10-CM | POA: Diagnosis not present

## 2018-01-18 DIAGNOSIS — Z888 Allergy status to other drugs, medicaments and biological substances status: Secondary | ICD-10-CM | POA: Diagnosis not present

## 2018-01-18 DIAGNOSIS — Z86711 Personal history of pulmonary embolism: Secondary | ICD-10-CM | POA: Insufficient documentation

## 2018-01-18 NOTE — Progress Notes (Signed)
Safety precautions to be maintained throughout the outpatient stay will include: orient to surroundings, keep bed in low position, maintain call bell within reach at all times, provide assistance with transfer out of bed and ambulation.  

## 2018-01-18 NOTE — Patient Instructions (Addendum)
____________________________________________________________________________________________  Pain Prevention Technique  Definition:   A technique used to minimize the effects of an activity known to cause inflammation or swelling, which in turn leads to an increase in pain.  Purpose: To prevent swelling from occurring. It is based on the fact that it is easier to prevent swelling from happening than it is to get rid of it, once it occurs.  Contraindications: 1. Anyone with allergy or hypersensitivity to the recommended medications. 2. Anyone taking anticoagulants (Blood Thinners) (e.g., Coumadin, Warfarin, Plavix, etc.). 3. Patients in Renal Failure.  Technique: Before you undertake an activity known to cause pain, or a flare-up of your chronic pain, and before you experience any pain, do the following:  1. On a full stomach, take 4 (four) over the counter Ibuprofens 200mg  tablets (Motrin), for a total of 800 mg. 2. In addition, take over the counter Magnesium 400 to 500 mg, before doing the activity.  3. Six (6) hours later, again on a full stomach, repeat the Ibuprofen. 4. That night, take a warm shower and stretch under the running warm water.  This technique may be sufficient to abort the pain and discomfort before it happens. Keep in mind that it takes a lot less medication to prevent swelling than it takes to eliminate it once it occurs.  ____________________________________________________________________________________________  ____________________________________________________________________________________________  Preparing for Procedure with Sedation  Instructions: . Oral Intake: Do not eat or drink anything for at least 8 hours prior to your procedure. . Transportation: Public transportation is not allowed. Bring an adult driver. The driver must be physically present in our waiting room before any procedure can be started. Marland Kitchen Physical Assistance: Bring an adult  physically capable of assisting you, in the event you need help. This adult should keep you company at home for at least 6 hours after the procedure. . Blood Pressure Medicine: Take your blood pressure medicine with a sip of water the morning of the procedure. . Blood thinners: Notify our staff if you are taking any blood thinners. Depending on which one you take, there will be specific instructions on how and when to stop it. . Diabetics on insulin: Notify the staff so that you can be scheduled 1st case in the morning. If your diabetes requires high dose insulin, take only  of your normal insulin dose the morning of the procedure and notify the staff that you have done so. . Preventing infections: Shower with an antibacterial soap the morning of your procedure. . Build-up your immune system: Take 1000 mg of Vitamin C with every meal (3 times a day) the day prior to your procedure. Marland Kitchen Antibiotics: Inform the staff if you have a condition or reason that requires you to take antibiotics before dental procedures. . Pregnancy: If you are pregnant, call and cancel the procedure. . Sickness: If you have a cold, fever, or any active infections, call and cancel the procedure. . Arrival: You must be in the facility at least 30 minutes prior to your scheduled procedure. . Children: Do not bring children with you. . Dress appropriately: Bring dark clothing that you would not mind if they get stained. . Valuables: Do not bring any jewelry or valuables.  Procedure appointments are reserved for interventional treatments only. Marland Kitchen No Prescription Refills. . No medication changes will be discussed during procedure appointments. . No disability issues will be discussed.  Reasons to call and reschedule or cancel your procedure: (Following these recommendations will minimize the risk of a serious complication.) .  Surgeries: Avoid having procedures within 2 weeks of any surgery. (Avoid for 2 weeks before or after any  surgery). . Flu Shots: Avoid having procedures within 2 weeks of a flu shots or . (Avoid for 2 weeks before or after immunizations). . Barium: Avoid having a procedure within 7-10 days after having had a radiological study involving the use of radiological contrast. (Myelograms, Barium swallow or enema study). . Heart attacks: Avoid any elective procedures or surgeries for the initial 6 months after a "Myocardial Infarction" (Heart Attack). . Blood thinners: It is imperative that you stop these medications before procedures. Let us know if you if you take any blood thinner.  . Infection: Avoid procedures during or within two weeks of an infection (including chest colds or gastrointestinal problems). Symptoms associated with infections include: Localized redness, fever, chills, night sweats or profuse sweating, burning sensation when voiding, cough, congestion, stuffiness, runny nose, sore throat, diarrhea, nausea, vomiting, cold or Flu symptoms, recent or current infections. It is specially important if the infection is over the area that we intend to treat. Marland Kitchen Heart and lung problems: Symptoms that may suggest an active cardiopulmonary problem include: cough, chest pain, breathing difficulties or shortness of breath, dizziness, ankle swelling, uncontrolled high or unusually low blood pressure, and/or palpitations. If you are experiencing any of these symptoms, cancel your procedure and contact your primary care physician for an evaluation.  Remember:  Regular Business hours are:  Monday to Thursday 8:00 AM to 4:00 PM  Provider's Schedule: Milinda Pointer, MD:  Procedure days: Tuesday and Thursday 7:30 AM to 4:00 PM  Gillis Santa, MD:  Procedure days: Monday and Wednesday 7:30 AM to 4:00 PM ____________________________________________________________________________________________   ____________________________________________________________________________________________  Pain  Scale  Introduction: The pain score used by this practice is the Verbal Numerical Rating Scale (VNRS-11). This is an 11-point scale. It is for adults and children 10 years or older. There are significant differences in how the pain score is reported, used, and applied. Forget everything you learned in the past and learn this scoring system.  General Information: The scale should reflect your current level of pain. Unless you are specifically asked for the level of your worst pain, or your average pain. If you are asked for one of these two, then it should be understood that it is over the past 24 hours.  Basic Activities of Daily Living (ADL): Personal hygiene, dressing, eating, transferring, and using restroom.  Instructions: Most patients tend to report their level of pain as a combination of two factors, their physical pain and their psychosocial pain. This last one is also known as "suffering" and it is reflection of how physical pain affects you socially and psychologically. From now on, report them separately. From this point on, when asked to report your pain level, report only your physical pain. Use the following table for reference.  Pain Clinic Pain Levels (0-5/10)  Pain Level Score  Description  No Pain 0   Mild pain 1 Nagging, annoying, but does not interfere with basic activities of daily living (ADL). Patients are able to eat, bathe, get dressed, toileting (being able to get on and off the toilet and perform personal hygiene functions), transfer (move in and out of bed or a chair without assistance), and maintain continence (able to control bladder and bowel functions). Blood pressure and heart rate are unaffected. A normal heart rate for a healthy adult ranges from 60 to 100 bpm (beats per minute).   Mild to moderate pain  2 Noticeable and distracting. Impossible to hide from other people. More frequent flare-ups. Still possible to adapt and function close to normal. It can be very  annoying and may have occasional stronger flare-ups. With discipline, patients may get used to it and adapt.   Moderate pain 3 Interferes significantly with activities of daily living (ADL). It becomes difficult to feed, bathe, get dressed, get on and off the toilet or to perform personal hygiene functions. Difficult to get in and out of bed or a chair without assistance. Very distracting. With effort, it can be ignored when deeply involved in activities.   Moderately severe pain 4 Impossible to ignore for more than a few minutes. With effort, patients may still be able to manage work or participate in some social activities. Very difficult to concentrate. Signs of autonomic nervous system discharge are evident: dilated pupils (mydriasis); mild sweating (diaphoresis); sleep interference. Heart rate becomes elevated (>115 bpm). Diastolic blood pressure (lower number) rises above 100 mmHg. Patients find relief in laying down and not moving.   Severe pain 5 Intense and extremely unpleasant. Associated with frowning face and frequent crying. Pain overwhelms the senses.  Ability to do any activity or maintain social relationships becomes significantly limited. Conversation becomes difficult. Pacing back and forth is common, as getting into a comfortable position is nearly impossible. Pain wakes you up from deep sleep. Physical signs will be obvious: pupillary dilation; increased sweating; goosebumps; brisk reflexes; cold, clammy hands and feet; nausea, vomiting or dry heaves; loss of appetite; significant sleep disturbance with inability to fall asleep or to remain asleep. When persistent, significant weight loss is observed due to the complete loss of appetite and sleep deprivation.  Blood pressure and heart rate becomes significantly elevated. Caution: If elevated blood pressure triggers a pounding headache associated with blurred vision, then the patient should immediately seek attention at an urgent or  emergency care unit, as these may be signs of an impending stroke.    Emergency Department Pain Levels (6-10/10)  Emergency Room Pain 6 Severely limiting. Requires emergency care and should not be seen or managed at an outpatient pain management facility. Communication becomes difficult and requires great effort. Assistance to reach the emergency department may be required. Facial flushing and profuse sweating along with potentially dangerous increases in heart rate and blood pressure will be evident.   Distressing pain 7 Self-care is very difficult. Assistance is required to transport, or use restroom. Assistance to reach the emergency department will be required. Tasks requiring coordination, such as bathing and getting dressed become very difficult.   Disabling pain 8 Self-care is no longer possible. At this level, pain is disabling. The individual is unable to do even the most "basic" activities such as walking, eating, bathing, dressing, transferring to a bed, or toileting. Fine motor skills are lost. It is difficult to think clearly.   Incapacitating pain 9 Pain becomes incapacitating. Thought processing is no longer possible. Difficult to remember your own name. Control of movement and coordination are lost.   The worst pain imaginable 10 At this level, most patients pass out from pain. When this level is reached, collapse of the autonomic nervous system occurs, leading to a sudden drop in blood pressure and heart rate. This in turn results in a temporary and dramatic drop in blood flow to the brain, leading to a loss of consciousness. Fainting is one of the body's self defense mechanisms. Passing out puts the brain in a calmed state and causes it to  shut down for a while, in order to begin the healing process.    Summary: 1. Refer to this scale when providing [place] with your pain level. 2. Be accurate and careful when reporting your pain level. This will help with your care. 3. Over-reporting  your pain level will lead to loss of credibility. 4. Even a level of 1/10 means that there is pain and will be treated at our facility. 5. High, inaccurate reporting will be documented as "Symptom Exaggeration", leading to loss of credibility and suspicions of possible secondary gains such as obtaining more narcotics, or wanting to appear disabled, for fraudulent reasons. 6. Only pain levels of 5 or below will be seen at our facility. 7. Pain levels of 6 and above will be sent to the Emergency Department and the appointment cancelled. ____________________________________________________________________________________________

## 2018-01-26 ENCOUNTER — Other Ambulatory Visit: Payer: Self-pay

## 2018-01-26 ENCOUNTER — Ambulatory Visit
Admission: RE | Admit: 2018-01-26 | Discharge: 2018-01-26 | Disposition: A | Discharge status: Home / Self Care | Payer: PPO | Source: Ambulatory Visit | Attending: Pain Medicine | Admitting: Pain Medicine

## 2018-01-26 ENCOUNTER — Encounter: Payer: Self-pay | Admitting: Pain Medicine

## 2018-01-26 ENCOUNTER — Ambulatory Visit (HOSPITAL_BASED_OUTPATIENT_CLINIC_OR_DEPARTMENT_OTHER): Payer: PPO | Admitting: Pain Medicine

## 2018-01-26 VITALS — BP 130/70 | HR 78 | Temp 97.3°F | Resp 18 | Ht 63.0 in | Wt 218.0 lb

## 2018-01-26 DIAGNOSIS — Z888 Allergy status to other drugs, medicaments and biological substances status: Secondary | ICD-10-CM | POA: Diagnosis not present

## 2018-01-26 DIAGNOSIS — M1288 Other specific arthropathies, not elsewhere classified, other specified site: Secondary | ICD-10-CM | POA: Diagnosis not present

## 2018-01-26 DIAGNOSIS — M4316 Spondylolisthesis, lumbar region: Secondary | ICD-10-CM | POA: Diagnosis not present

## 2018-01-26 DIAGNOSIS — Z91040 Latex allergy status: Secondary | ICD-10-CM

## 2018-01-26 DIAGNOSIS — M4300 Spondylolysis, site unspecified: Secondary | ICD-10-CM

## 2018-01-26 DIAGNOSIS — M4310 Spondylolisthesis, site unspecified: Secondary | ICD-10-CM

## 2018-01-26 DIAGNOSIS — M545 Low back pain: Secondary | ICD-10-CM | POA: Diagnosis not present

## 2018-01-26 DIAGNOSIS — Z9889 Other specified postprocedural states: Secondary | ICD-10-CM | POA: Insufficient documentation

## 2018-01-26 DIAGNOSIS — Z885 Allergy status to narcotic agent status: Secondary | ICD-10-CM | POA: Diagnosis not present

## 2018-01-26 DIAGNOSIS — M47816 Spondylosis without myelopathy or radiculopathy, lumbar region: Secondary | ICD-10-CM

## 2018-01-26 DIAGNOSIS — Z79899 Other long term (current) drug therapy: Secondary | ICD-10-CM | POA: Diagnosis not present

## 2018-01-26 DIAGNOSIS — M4306 Spondylolysis, lumbar region: Secondary | ICD-10-CM

## 2018-01-26 DIAGNOSIS — M47817 Spondylosis without myelopathy or radiculopathy, lumbosacral region: Secondary | ICD-10-CM

## 2018-01-26 DIAGNOSIS — G8929 Other chronic pain: Secondary | ICD-10-CM

## 2018-01-26 MED ORDER — LACTATED RINGERS IV SOLN
1000.0000 mL | Freq: Once | INTRAVENOUS | Status: AC
  Administered 2018-01-26: 1000 mL via INTRAVENOUS

## 2018-01-26 MED ORDER — ROPIVACAINE HCL 2 MG/ML IJ SOLN
18.0000 mL | Freq: Once | INTRAMUSCULAR | Status: AC
  Administered 2018-01-26: 18 mL via PERINEURAL
  Filled 2018-01-26: qty 20

## 2018-01-26 MED ORDER — LIDOCAINE HCL 2 % IJ SOLN
20.0000 mL | Freq: Once | INTRAMUSCULAR | Status: AC
  Administered 2018-01-26: 400 mg
  Filled 2018-01-26: qty 40

## 2018-01-26 MED ORDER — MIDAZOLAM HCL 5 MG/5ML IJ SOLN
1.0000 mg | INTRAMUSCULAR | Status: DC | PRN
  Administered 2018-01-26: 1 mg via INTRAVENOUS
  Filled 2018-01-26: qty 5

## 2018-01-26 MED ORDER — TRIAMCINOLONE ACETONIDE 40 MG/ML IJ SUSP
80.0000 mg | Freq: Once | INTRAMUSCULAR | Status: AC
  Administered 2018-01-26: 40 mg
  Filled 2018-01-26: qty 2

## 2018-01-26 MED ORDER — FENTANYL CITRATE (PF) 100 MCG/2ML IJ SOLN
25.0000 ug | INTRAMUSCULAR | Status: DC | PRN
  Administered 2018-01-26: 50 ug via INTRAVENOUS
  Filled 2018-01-26: qty 2

## 2018-01-26 NOTE — Progress Notes (Signed)
Safety precautions to be maintained throughout the outpatient stay will include: orient to surroundings, keep bed in low position, maintain call bell within reach at all times, provide assistance with transfer out of bed and ambulation.  

## 2018-01-26 NOTE — Patient Instructions (Addendum)
____________________________________________________________________________________________  Post-Procedure Discharge Instructions  Instructions:  Apply ice: Fill a plastic sandwich bag with crushed ice. Cover it with a small towel and apply to injection site. Apply for 15 minutes then remove x 15 minutes. Repeat sequence on day of procedure, until you go to bed. The purpose is to minimize swelling and discomfort after procedure.  Apply heat: Apply heat to procedure site starting the day following the procedure. The purpose is to treat any soreness and discomfort from the procedure.  Food intake: Start with clear liquids (like water) and advance to regular food, as tolerated.   Physical activities: Keep activities to a minimum for the first 8 hours after the procedure.   Driving: If you have received any sedation, you are not allowed to drive for 24 hours after your procedure.  Blood thinner: Restart your blood thinner 6 hours after your procedure. (Only for those taking blood thinners)  Insulin: As soon as you can eat, you may resume your normal dosing schedule. (Only for those taking insulin)  Infection prevention: Keep procedure site clean and dry.  Post-procedure Pain Diary: Extremely important that this be done correctly and accurately. Recorded information will be used to determine the next step in treatment.  Pain evaluated is that of treated area only. Do not include pain from an untreated area.  Complete every hour, on the hour, for the initial 8 hours. Set an alarm to help you do this part accurately.  Do not go to sleep and have it completed later. It will not be accurate.  Follow-up appointment: Keep your follow-up appointment after the procedure. Usually 2 weeks for most procedures. (6 weeks in the case of radiofrequency.) Bring you pain diary.   Expect:  From numbing medicine (AKA: Local Anesthetics): Numbness or decrease in pain.  Onset: Full effect within 15  minutes of injected.  Duration: It will depend on the type of local anesthetic used. On the average, 1 to 8 hours.   From steroids: Decrease in swelling or inflammation. Once inflammation is improved, relief of the pain will follow.  Onset of benefits: Depends on the amount of swelling present. The more swelling, the longer it will take for the benefits to be seen. In some cases, up to 10 days.  Duration: Steroids will stay in the system x 2 weeks. Duration of benefits will depend on multiple posibilities including persistent irritating factors.  Occasional side-effects: Facial flushing, cramps (if present, drink Gatorade and take over-the-counter Magnesium 450-500 mg once to twice a day).  From procedure: Some discomfort is to be expected once the numbing medicine wears off. This should be minimal if ice and heat are applied as instructed.  Call if:  You experience numbness and weakness that gets worse with time, as opposed to wearing off.  New onset bowel or bladder incontinence. (This applies to Spinal procedures only)  Emergency Numbers:  Durning business hours (Monday - Thursday, 8:00 AM - 4:00 PM) (Friday, 9:00 AM - 12:00 Noon): [phone]  After hours: [phone] ____________________________________________________________________________________________   ____________________________________________________________________________________________  Pain Scale  Introduction: The pain score used by this practice is the Verbal Numerical Rating Scale (VNRS-11). This is an 11-point scale. It is for adults and children 10 years or older. There are significant differences in how the pain score is reported, used, and applied. Forget everything you learned in the past and learn this scoring system.  General Information: The scale should reflect your current level of pain. Unless you are specifically asked  for the level of your worst pain, or your average pain. If you are asked  for one of these two, then it should be understood that it is over the past 24 hours.  Basic Activities of Daily Living (ADL): Personal hygiene, dressing, eating, transferring, and using restroom.  Instructions: Most patients tend to report their level of pain as a combination of two factors, their physical pain and their psychosocial pain. This last one is also known as "suffering" and it is reflection of how physical pain affects you socially and psychologically. From now on, report them separately. From this point on, when asked to report your pain level, report only your physical pain. Use the following table for reference.  Pain Clinic Pain Levels (0-5/10)  Pain Level Score  Description  No Pain 0   Mild pain 1 Nagging, annoying, but does not interfere with basic activities of daily living (ADL). Patients are able to eat, bathe, get dressed, toileting (being able to get on and off the toilet and perform personal hygiene functions), transfer (move in and out of bed or a chair without assistance), and maintain continence (able to control bladder and bowel functions). Blood pressure and heart rate are unaffected. A normal heart rate for a healthy adult ranges from 60 to 100 bpm (beats per minute).   Mild to moderate pain 2 Noticeable and distracting. Impossible to hide from other people. More frequent flare-ups. Still possible to adapt and function close to normal. It can be very annoying and may have occasional stronger flare-ups. With discipline, patients may get used to it and adapt.   Moderate pain 3 Interferes significantly with activities of daily living (ADL). It becomes difficult to feed, bathe, get dressed, get on and off the toilet or to perform personal hygiene functions. Difficult to get in and out of bed or a chair without assistance. Very distracting. With effort, it can be ignored when deeply involved in activities.   Moderately severe pain 4 Impossible to ignore for more than a few  minutes. With effort, patients may still be able to manage work or participate in some social activities. Very difficult to concentrate. Signs of autonomic nervous system discharge are evident: dilated pupils (mydriasis); mild sweating (diaphoresis); sleep interference. Heart rate becomes elevated (>115 bpm). Diastolic blood pressure (lower number) rises above 100 mmHg. Patients find relief in laying down and not moving.   Severe pain 5 Intense and extremely unpleasant. Associated with frowning face and frequent crying. Pain overwhelms the senses.  Ability to do any activity or maintain social relationships becomes significantly limited. Conversation becomes difficult. Pacing back and forth is common, as getting into a comfortable position is nearly impossible. Pain wakes you up from deep sleep. Physical signs will be obvious: pupillary dilation; increased sweating; goosebumps; brisk reflexes; cold, clammy hands and feet; nausea, vomiting or dry heaves; loss of appetite; significant sleep disturbance with inability to fall asleep or to remain asleep. When persistent, significant weight loss is observed due to the complete loss of appetite and sleep deprivation.  Blood pressure and heart rate becomes significantly elevated. Caution: If elevated blood pressure triggers a pounding headache associated with blurred vision, then the patient should immediately seek attention at an urgent or emergency care unit, as these may be signs of an impending stroke.    Emergency Department Pain Levels (6-10/10)  Emergency Room Pain 6 Severely limiting. Requires emergency care and should not be seen or managed at an outpatient pain management facility. Communication becomes difficult  and requires great effort. Assistance to reach the emergency department may be required. Facial flushing and profuse sweating along with potentially dangerous increases in heart rate and blood pressure will be evident.   Distressing pain 7  Self-care is very difficult. Assistance is required to transport, or use restroom. Assistance to reach the emergency department will be required. Tasks requiring coordination, such as bathing and getting dressed become very difficult.   Disabling pain 8 Self-care is no longer possible. At this level, pain is disabling. The individual is unable to do even the most "basic" activities such as walking, eating, bathing, dressing, transferring to a bed, or toileting. Fine motor skills are lost. It is difficult to think clearly.   Incapacitating pain 9 Pain becomes incapacitating. Thought processing is no longer possible. Difficult to remember your own name. Control of movement and coordination are lost.   The worst pain imaginable 10 At this level, most patients pass out from pain. When this level is reached, collapse of the autonomic nervous system occurs, leading to a sudden drop in blood pressure and heart rate. This in turn results in a temporary and dramatic drop in blood flow to the brain, leading to a loss of consciousness. Fainting is one of the body's self defense mechanisms. Passing out puts the brain in a calmed state and causes it to shut down for a while, in order to begin the healing process.    Summary: 1. Refer to this scale when providing [place] with your pain level. 2. Be accurate and careful when reporting your pain level. This will help with your care. 3. Over-reporting your pain level will lead to loss of credibility. 4. Even a level of 1/10 means that there is pain and will be treated at our facility. 5. High, inaccurate reporting will be documented as "Symptom Exaggeration", leading to loss of credibility and suspicions of possible secondary gains such as obtaining more narcotics, or wanting to appear disabled, for fraudulent reasons. 6. Only pain levels of 5 or below will be seen at our facility. 7. Pain levels of 6 and above will be sent to the Emergency Department and the appointment  cancelled. ____________________________________________________________________________________________

## 2018-01-26 NOTE — Progress Notes (Signed)
Patient's Name: Kara [name]  MRN: [id number]  Referring Provider: Milinda Pointer, MD  DOB: 1950/12/30  PCP: Tracie Harrier, MD  DOS: 01/26/2018  Note by: Gaspar Cola, MD  Service setting: Ambulatory outpatient  Specialty: Interventional Pain Management  Patient type: Established  Location: [name] (AMB) Pain Management Facility  Visit type: Interventional Procedure   Primary Reason for Visit: Interventional Pain Management Treatment. CC: Back Pain (lower)  Procedure:          Anesthesia, Analgesia, Anxiolysis:  Type: Lumbar Facet, Medial Branch Block(s) #2  Primary Purpose: Diagnostic Region: Posterolateral Lumbosacral Spine Level: L2, L3, L4, L5, & S1 Medial Branch Level(s). Injecting these levels blocks the L3-4, L4-5, and L5-S1 lumbar facet joints. Laterality: Bilateral  Type: Moderate (Conscious) Sedation combined with Local Anesthesia Indication(s): Analgesia and Anxiety Route: Intravenous (IV) IV Access: Secured Sedation: Meaningful verbal contact was maintained at all times during the procedure  Local Anesthetic: Lidocaine 1-2%   Indications: 1. Spondylosis without myelopathy or radiculopathy, lumbosacral region   2. Lumbar facet syndrome (Bilateral)   3. Lumbar facet arthropathy (L3-4, L4-5, and L5-S1) (Bilateral)   4. 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    Pain Score: Pre-procedure: 8 /10 Post-procedure: 0-No pain/10  Pre-op Assessment:  Kara [name] is a 67 y.o. (year old), female patient, seen today for interventional treatment. She  has a past surgical history that includes Knee surgery; Carpal tunnel release (Bilateral); Colon resection sigmoid (N/A, 11/02/2016); Colostomy (N/A, 11/02/2016); Sigmoidoscopy (N/A, 11/13/2016); PULMONARY VENOGRAPHY (N/A, 11/19/2016); Colonoscopy with propofol (N/A, 02/03/2017); Colon surgery  (11/02/2016); IVC FILTER INSERTION (Right, 10/2016); Colostomy reversal (N/A, 02/24/2017); Appendectomy (N/A, 02/24/2017); Lysis of adhesion (N/A, 02/24/2017); laparoscopy (N/A, 02/24/2017); Ileo loop diversion (N/A, 02/24/2017); Sigmoidoscopy (N/A, 05/19/2017); Ileostomy closure (N/A, 06/01/2017); IVC FILTER REMOVAL (N/A, 08/09/2017); and Sacroplasty (N/A, 11/08/2017). Kara [name] has a current medication list which includes the following prescription(s): aspirin ec, candesartan, cyclobenzaprine, gabapentin, glipizide, meclizine, and multi-vitamins, and the following Facility-Administered Medications: fentanyl and midazolam. Her primarily concern today is the Back Pain (lower)  Initial Vital Signs:  Pulse/HCG Rate: 76ECG Heart Rate: 94 Temp: 97.8 F (36.6 C) Resp: 18 BP: (!) 144/80 SpO2: 97 %  BMI: Estimated body mass index is 38.62 kg/m as calculated from the following:   Height as of this encounter: 5\' 3"  (1.6 m).   Weight as of this encounter: 218 lb (98.9 kg).  Risk Assessment: Allergies: Reviewed. She is allergic to adhesive [tape]; morphine; other; latex; and morphine and related.  Allergy Precautions: None required Coagulopathies: Reviewed. None identified.  Blood-thinner therapy: None at this time Active Infection(s): Reviewed. None identified. Kara [name] is afebrile  Site Confirmation: Kara [name] was asked to confirm the procedure and laterality before marking the site Procedure checklist: Completed Consent: Before the procedure and under the influence of no sedative(s), amnesic(s), or anxiolytics, the patient was informed of the treatment options, risks and possible complications. To fulfill our ethical and legal obligations, as recommended by the American Medical Association's Code of Ethics, I have informed the patient of my clinical impression; the nature and purpose of the treatment or procedure; the risks, benefits, and possible complications of the  intervention; the alternatives, including doing nothing; the risk(s) and benefit(s) of the alternative treatment(s) or procedure(s); and the risk(s) and benefit(s) of doing nothing. The patient was provided information about the general risks and possible complications associated  with the procedure. These may include, but are not limited to: failure to achieve desired goals, infection, bleeding, organ or nerve damage, allergic reactions, paralysis, and death. In addition, the patient was informed of those risks and complications associated to Spine-related procedures, such as failure to decrease pain; infection (i.e.: Meningitis, epidural or intraspinal abscess); bleeding (i.e.: epidural hematoma, subarachnoid hemorrhage, or any other type of intraspinal or peri-dural bleeding); organ or nerve damage (i.e.: Any type of peripheral nerve, nerve root, or spinal cord injury) with subsequent damage to sensory, motor, and/or autonomic systems, resulting in permanent pain, numbness, and/or weakness of one or several areas of the body; allergic reactions; (i.e.: anaphylactic reaction); and/or death. Furthermore, the patient was informed of those risks and complications associated with the medications. These include, but are not limited to: allergic reactions (i.e.: anaphylactic or anaphylactoid reaction(s)); adrenal axis suppression; blood sugar elevation that in diabetics may result in ketoacidosis or comma; water retention that in patients with history of congestive heart failure may result in shortness of breath, pulmonary edema, and decompensation with resultant heart failure; weight gain; swelling or edema; medication-induced neural toxicity; particulate matter embolism and blood vessel occlusion with resultant organ, and/or nervous system infarction; and/or aseptic necrosis of one or more joints. Finally, the patient was informed that Medicine is not an exact science; therefore, there is also the possibility of  unforeseen or unpredictable risks and/or possible complications that may result in a catastrophic outcome. The patient indicated having understood very clearly. We have given the patient no guarantees and we have made no promises. Enough time was given to the patient to ask questions, all of which were answered to the patient's satisfaction. Ms. Muchmore has indicated that she wanted to continue with the procedure. Attestation: I, the ordering provider, attest that I have discussed with the patient the benefits, risks, side-effects, alternatives, likelihood of achieving goals, and potential problems during recovery for the procedure that I have provided informed consent. Date  Time: 01/26/2018  8:31 AM  Pre-Procedure Preparation:  Monitoring: As per clinic protocol. Respiration, ETCO2, SpO2, BP, heart rate and rhythm monitor placed and checked for adequate function Safety Precautions: Patient was assessed for positional comfort and pressure points before starting the procedure. Time-out: I initiated and conducted the "Time-out" before starting the procedure, as per protocol. The patient was asked to participate by confirming the accuracy of the "Time Out" information. Verification of the correct person, site, and procedure were performed and confirmed by me, the nursing staff, and the patient. "Time-out" conducted as per Joint Commission's Universal Protocol (UP.01.01.01). Time: 0900  Description of Procedure:          Position: Prone Laterality: Bilateral. The procedure was performed in identical fashion on both sides. Levels:  L2, L3, L4, L5, & S1 Medial Branch Level(s) Area Prepped: Posterior Lumbosacral Region Prepping solution: ChloraPrep (2% chlorhexidine gluconate and 70% isopropyl alcohol) Safety Precautions: Aspiration looking for blood return was conducted prior to all injections. At no point did we inject any substances, as a needle was being advanced. Before injecting, the patient  was told to immediately notify me if she was experiencing any new onset of "ringing in the ears, or metallic taste in the mouth". No attempts were made at seeking any paresthesias. Safe injection practices and needle disposal techniques used. Medications properly checked for expiration dates. SDV (single dose vial) medications used. After the completion of the procedure, all disposable equipment used was discarded in the proper designated medical waste containers. Local Anesthesia:  Protocol guidelines were followed. The patient was positioned over the fluoroscopy table. The area was prepped in the usual manner. The time-out was completed. The target area was identified using fluoroscopy. A 12-in long, straight, sterile hemostat was used with fluoroscopic guidance to locate the targets for each level blocked. Once located, the skin was marked with an approved surgical skin marker. Once all sites were marked, the skin (epidermis, dermis, and hypodermis), as well as deeper tissues (fat, connective tissue and muscle) were infiltrated with a small amount of a short-acting local anesthetic, loaded on a 10cc syringe with a 25G, 1.5-in  Needle. An appropriate amount of time was allowed for local anesthetics to take effect before proceeding to the next step. Local Anesthetic: Lidocaine 2.0% The unused portion of the local anesthetic was discarded in the proper designated containers. Technical explanation of process:  L2 Medial Branch Nerve Block (MBB): The target area for the L2 medial branch is at the junction of the postero-lateral aspect of the superior articular process and the superior, posterior, and medial edge of the transverse process of L3. Under fluoroscopic guidance, a Quincke needle was inserted until contact was made with os over the superior postero-lateral aspect of the pedicular shadow (target area). After negative aspiration for blood, 0.5 mL of the nerve block solution was injected without difficulty  or complication. The needle was removed intact. L3 Medial Branch Nerve Block (MBB): The target area for the L3 medial branch is at the junction of the postero-lateral aspect of the superior articular process and the superior, posterior, and medial edge of the transverse process of L4. Under fluoroscopic guidance, a Quincke needle was inserted until contact was made with os over the superior postero-lateral aspect of the pedicular shadow (target area). After negative aspiration for blood, 0.5 mL of the nerve block solution was injected without difficulty or complication. The needle was removed intact. L4 Medial Branch Nerve Block (MBB): The target area for the L4 medial branch is at the junction of the postero-lateral aspect of the superior articular process and the superior, posterior, and medial edge of the transverse process of L5. Under fluoroscopic guidance, a Quincke needle was inserted until contact was made with os over the superior postero-lateral aspect of the pedicular shadow (target area). After negative aspiration for blood, 0.5 mL of the nerve block solution was injected without difficulty or complication. The needle was removed intact. L5 Medial Branch Nerve Block (MBB): The target area for the L5 medial branch is at the junction of the postero-lateral aspect of the superior articular process and the superior, posterior, and medial edge of the sacral ala. Under fluoroscopic guidance, a Quincke needle was inserted until contact was made with os over the superior postero-lateral aspect of the pedicular shadow (target area). After negative aspiration for blood, 0.5 mL of the nerve block solution was injected without difficulty or complication. The needle was removed intact. S1 Medial Branch Nerve Block (MBB): The target area for the S1 medial branch is at the posterior and inferior 6 o'clock position of the L5-S1 facet joint. Under fluoroscopic guidance, the Quincke needle inserted for the L5 MBB was  redirected until contact was made with os over the inferior and postero aspect of the sacrum, at the 6 o' clock position under the L5-S1 facet joint (Target area). After negative aspiration for blood, 0.5 mL of the nerve block solution was injected without difficulty or complication. The needle was removed intact. Procedural Needles: 22-gauge, 3.5-inch, Quincke needles used for all  levels. Nerve block solution: 0.2% PF-Ropivacaine + Triamcinolone (40 mg/mL) diluted to a final concentration of 4 mg of Triamcinolone/mL of Ropivacaine The unused portion of the solution was discarded in the proper designated containers.  Once the entire procedure was completed, the treated area was cleaned, making sure to leave some of the prepping solution back to take advantage of its long term bactericidal properties.   Illustration of the posterior view of the lumbar spine and the posterior neural structures. Laminae of L2 through S1 are labeled. DPRL5, dorsal primary ramus of L5; DPRS1, dorsal primary ramus of S1; DPR3, dorsal primary ramus of L3; FJ, facet (zygapophyseal) joint L3-L4; I, inferior articular process of L4; LB1, lateral branch of dorsal primary ramus of L1; IAB, inferior articular branches from L3 medial branch (supplies L4-L5 facet joint); IBP, intermediate branch plexus; MB3, medial branch of dorsal primary ramus of L3; NR3, third lumbar nerve root; S, superior articular process of L5; SAB, superior articular branches from L4 (supplies L4-5 facet joint also); TP3, transverse process of L3.  Vitals:   01/26/18 0915 01/26/18 0925 01/26/18 0935 01/26/18 0946  BP: 128/80 (!) 167/73 (!) 142/68 130/70  Pulse: 78     Resp: 19 18 17 18   Temp:  (!) 96.9 F (36.1 C)  (!) 97.3 F (36.3 C)  TempSrc:      SpO2: 98% 98% 98% 98%  Weight:      Height:        Start Time: 0900 hrs. End Time: 0912 hrs.  Imaging Guidance (Spinal):          Type of Imaging Technique: Fluoroscopy Guidance  (Spinal) Indication(s): Assistance in needle guidance and placement for procedures requiring needle placement in or near specific anatomical locations not easily accessible without such assistance. Exposure Time: Please see nurses notes. Contrast: None used. Fluoroscopic Guidance: I was personally present during the use of fluoroscopy. "Tunnel Vision Technique" used to obtain the best possible view of the target area. Parallax error corrected before commencing the procedure. "Direction-depth-direction" technique used to introduce the needle under continuous pulsed fluoroscopy. Once target was reached, antero-posterior, oblique, and lateral fluoroscopic projection used confirm needle placement in all planes. Images permanently stored in EMR. Interpretation: No contrast injected. I personally interpreted the imaging intraoperatively. Adequate needle placement confirmed in multiple planes. Permanent images saved into the patient's record.  Antibiotic Prophylaxis:   Anti-infectives (From admission, onward)   None     Indication(s): None identified  Post-operative Assessment:  Post-procedure Vital Signs:  Pulse/HCG Rate: 7887 Temp: (!) 97.3 F (36.3 C) Resp: 18 BP: 130/70 SpO2: 98 %  EBL: None  Complications: No immediate post-treatment complications observed by team, or reported by patient.  Note: The patient tolerated the entire procedure well. A repeat set of vitals were taken after the procedure and the patient was kept under observation following institutional policy, for this type of procedure. Post-procedural neurological assessment was performed, showing return to baseline, prior to discharge. The patient was provided with post-procedure discharge instructions, including a section on how to identify potential problems. Should any problems arise concerning this procedure, the patient was given instructions to immediately contact us, at any time, without hesitation. In any case, we plan  to contact the patient by telephone for a follow-up status report regarding this interventional procedure.  Comments:  No additional relevant information.  Plan of Care    Imaging Orders     DG C-Arm 1-60 Min-No Report  Procedure Orders     LUMBAR  FACET(MEDIAL BRANCH NERVE BLOCK) MBNB  Medications ordered for procedure: Meds ordered this encounter  Medications  . lidocaine (XYLOCAINE) 2 % (with pres) injection 400 mg  . midazolam (VERSED) 5 MG/5ML injection 1-2 mg    Make sure Flumazenil is available in the pyxis when using this medication. If oversedation occurs, administer 0.2 mg IV over 15 sec. If after 45 sec no response, administer 0.2 mg again over 1 min; may repeat at 1 min intervals; not to exceed 4 doses (1 mg)  . fentaNYL (SUBLIMAZE) injection 25-50 mcg    Make sure Narcan is available in the pyxis when using this medication. In the event of respiratory depression (RR< 8/min): Titrate NARCAN (naloxone) in increments of 0.1 to 0.2 mg IV at 2-3 minute intervals, until desired degree of reversal.  . lactated ringers infusion 1,000 mL  . ropivacaine (PF) 2 mg/mL (0.2%) (NAROPIN) injection 18 mL  . triamcinolone acetonide (KENALOG-40) injection 80 mg   Medications administered: We administered lidocaine, midazolam, fentaNYL, lactated ringers, ropivacaine (PF) 2 mg/mL (0.2%), and triamcinolone acetonide.  See the medical record for exact dosing, route, and time of administration.  New Prescriptions   No medications on file   Disposition: Discharge home  Discharge Date & Time: 01/26/2018; 0947 hrs.   Physician-requested Follow-up: Return for post-procedure eval (2 wks), w/ Dr. Dossie Arbour.  Future Appointments  Date Time Provider [place]  02/13/2018  9:15 AM Milinda Pointer, MD [facility] None   Primary Care Physician: Tracie Harrier, MD Location: [facility] Outpatient Pain Management Facility Note by: Gaspar Cola, MD Date: 01/26/2018; Time: 10:07  AM  Disclaimer:  Medicine is not an exact science. The only guarantee in medicine is that nothing is guaranteed. It is important to note that the decision to proceed with this intervention was based on the information collected from the patient. The Data and conclusions were drawn from the patient's questionnaire, the interview, and the physical examination. Because the information was provided in large part by the patient, it cannot be guaranteed that it has not been purposely or unconsciously manipulated. Every effort has been made to obtain as much relevant data as possible for this evaluation. It is important to note that the conclusions that lead to this procedure are derived in large part from the available data. Always take into account that the treatment will also be dependent on availability of resources and existing treatment guidelines, considered by other Pain Management Practitioners as being common knowledge and practice, at the time of the intervention. For Medico-Legal purposes, it is also important to point out that variation in procedural techniques and pharmacological choices are the acceptable norm. The indications, contraindications, technique, and results of the above procedure should only be interpreted and judged by a Board-Certified Interventional Pain Specialist with extensive familiarity and expertise in the same exact procedure and technique.

## 2018-01-27 ENCOUNTER — Telehealth: Payer: Self-pay | Admitting: *Deleted

## 2018-01-27 NOTE — Telephone Encounter (Signed)
Attempted to call for post procedure follow-up. No answer, unable to leave a message.

## 2018-02-12 NOTE — Progress Notes (Signed)
Patient's Name: Kara [name]  MRN: [id number]  Referring Provider: Tracie Harrier, MD  DOB: Oct 12, 1950  PCP: Tracie Harrier, MD  DOS: 02/13/2018  Note by: Gaspar Cola, MD  Service setting: Ambulatory outpatient  Specialty: Interventional Pain Management  Location: [name] (AMB) Pain Management Facility    Patient type: Established   Primary Reason(s) for Visit: Encounter for post-procedure evaluation of chronic illness with mild to moderate exacerbation CC: Back Pain (low)  HPI  Kara [name] is a 67 y.o. year old, female patient, who comes today for a post-procedure evaluation. She has Diabetes mellitus type 2 in obese ([place]); Type 2 diabetes mellitus without complication, without long-term current use of insulin ([place]); BP (high blood pressure); Nocturnal leg cramps; Cough; Chronic hip pain (Left); Acute non-recurrent maxillary sinusitis; Diverticulitis large intestine; Diverticulitis; DDD (degenerative disc disease), lumbar; Benign essential hypertension; Edema of both legs; Morbidly obese ([place]); Acute on chronic systolic (congestive) heart failure ([place]); HCAP (healthcare-associated pneumonia); Encounter for removal of staples; Other pulmonary embolism without acute cor pulmonale ([place]); History of colostomy reversal; Ileostomy in place [facility]); Positive result for methicillin resistant Staphylococcus aureus (MRSA) screening; H/O ileostomy; Diverticulosis of colon; History of pulmonary embolism; S/P IVC filter; Chronic low back pain (Primary Area of Pain) (Bilateral) w/ sciatica (Bilateral); Chronic sacroiliac joint pain (Secondary Area of Pain) (Right); Chronic pain syndrome; Long term current use of opiate analgesic; Pharmacologic therapy; Disorder of skeletal system; Problems influencing health status; Hyponatremia; Hypocalcemia; Sacral insufficiency fracture; 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-S1 anterolisthesis.; Lumbar foraminal stenosis (L3-4 and L4-5) (Bilateral); Lumbosacral  L5-S1 subarticular lateral recess stenosis (Bilateral); Lumbar facet arthropathy (L3-4, L4-5, and L5-S1) (Bilateral); Lumbar facet syndrome (Bilateral); 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 Her primarily concern today is the Back Pain (low)  Pain Assessment: Location: Lower Back Radiating: denies Onset: More than a month ago Duration: Chronic pain Quality: Dull, Aching Severity: 3 /10 (subjective, self-reported pain score)  Note: Reported level is compatible with observation.                         When using our objective Pain Scale, levels between 6 and 10/10 are said to belong in an emergency room, as it progressively worsens from a 6/10, described as severely limiting, requiring emergency care not usually available at an outpatient pain management facility. At a 6/10 level, communication becomes difficult and requires great effort. Assistance to reach the emergency department may be required. Facial flushing and profuse sweating along with potentially dangerous increases in heart rate and blood pressure will be evident. Effect on ADL:   Timing: Intermittent Modifying factors:   BP: 131/80  HR: 83  Kara [name] comes in today for post-procedure evaluation after the treatment done on 01/27/2018.  Further details on both, my assessment(s), as well as the proposed treatment plan, please see below.  Post-Procedure Assessment  01/26/2018 Procedure: Diagnostic bilateral lumbar facet block #2 under fluoroscopic  guidance and IV sedation Pre-procedure pain score:  8/10 Post-procedure pain score: 0/10 (100% relief) Influential Factors: BMI: 38.62 kg/m Intra-procedural challenges: None observed.  Assessment challenges: None detected.              Reported side-effects: None.        Post-procedural adverse reactions or complications: None reported         Sedation: Sedation provided. When no sedatives are used, the analgesic levels obtained are directly associated to the effectiveness of the local anesthetics. However, when sedation is provided, the level of analgesia obtained during the initial 1 hour following the intervention, is believed to be the result of a combination of factors. These factors may include, but are not limited to: 1. The effectiveness of the local anesthetics used. 2. The effects of the analgesic(s) and/or anxiolytic(s) used. 3. The degree of discomfort experienced by the patient at the time of the procedure. 4. The patients ability and reliability in recalling and recording the events. 5. The presence and influence of possible secondary gains and/or psychosocial factors. Reported result: Relief experienced during the 1st hour after the procedure: 90 % (Ultra-Short Term Relief)            Interpretative annotation: Clinically appropriate result. Analgesia during this period is likely to be Local Anesthetic and/or IV Sedative (Analgesic/Anxiolytic) related.          Effects of local anesthetic: The analgesic effects attained during this period are directly associated to the localized infiltration of local anesthetics and therefore cary significant diagnostic value as to the etiological location, or anatomical origin, of the pain. Expected duration of relief is directly dependent on the pharmacodynamics of the local anesthetic used. Long-acting (4-6 hours) anesthetics used.  Reported result: Relief during the next 4 to 6 hour after the procedure: 90 % (Short-Term Relief)             Interpretative annotation: Clinically appropriate result. Analgesia during this period is likely to be Local Anesthetic-related.          Long-term benefit: Defined as the period of time past the expected duration of local anesthetics (1 hour for short-acting and 4-6 hours for long-acting). With the possible exception of prolonged sympathetic blockade from the local anesthetics, benefits during this period are typically attributed to, or associated with, other factors such as analgesic sensory neuropraxia, antiinflammatory effects, or beneficial biochemical changes provided by agents other than the local anesthetics.  Reported result: Extended relief following procedure: 90 % (Long-Term Relief)            Interpretative annotation: Clinically possible results. Good relief. No permanent benefit expected. Inflammation plays a part in the etiology to the pain.          Current benefits: Defined as reported results that persistent at this point in time.   Analgesia: 75-100 %            Function: Somewhat improved ROM: Somewhat improved Interpretative annotation: Recurrence of symptoms. No permanent benefit expected. Effective diagnostic intervention.          Interpretation: Results would suggest a successful diagnostic intervention.                  Plan:  Set up procedure as a PRN palliative treatment option for this patient.                Laboratory Chemistry  Inflammation Markers (CRP: Acute Phase) (ESR: Chronic Phase) Lab Results  Component Value Date   CRP 5.3 (H) 11/02/2017   ESRSEDRATE 12 11/02/2017  Renal Markers Lab Results  Component Value Date   BUN 19 10/03/2017   CREATININE 0.63 10/03/2017   GFRAA >60 10/03/2017   GFRNONAA >60 10/03/2017                             Hepatic Markers Lab Results  Component Value Date   AST 40 04/21/2017   ALT 53 04/21/2017   ALBUMIN 3.8 04/21/2017                        Neuropathy Markers Lab Results   Component Value Date   VITAMINB12 367 11/02/2017   HGBA1C 7.2 (H) 02/17/2017   HIV Non Reactive 11/18/2016                        Hematology Parameters Lab Results  Component Value Date   PLT 200 10/03/2017   HGB 16.5 (H) 10/03/2017   HCT 49.7 (H) 10/03/2017                        CV Markers Lab Results  Component Value Date   BNP 60.0 11/17/2016   CKTOTAL 71 10/02/2011   CKMB < 0.5 (L) 10/02/2011   TROPONINI <0.03 11/17/2016                         Note: Lab results reviewed.  Recent Imaging Results   Results for orders placed in visit on 01/26/18  DG C-Arm 1-60 Min-No Report   Narrative Fluoroscopy was utilized by the requesting physician.  No radiographic  interpretation.    Interpretation Report: Fluoroscopy was used during the procedure to assist with needle guidance. The images were interpreted intraoperatively by the requesting physician.  Meds   Current Outpatient Medications:  .  aspirin EC 81 MG tablet, Take 81 mg by mouth daily., Disp: , Rfl:  .  candesartan (ATACAND) 16 MG tablet, Take 8 mg by mouth daily. , Disp: , Rfl:  .  cyclobenzaprine (FLEXERIL) 10 MG tablet, Take 10 mg by mouth at bedtime. , Disp: , Rfl:  .  gabapentin (NEURONTIN) 100 MG capsule, Take 200 mg by mouth 3 (three) times daily. , Disp: , Rfl:  .  glipiZIDE (GLUCOTROL XL) 5 MG 24 hr tablet, Take 5 mg by mouth daily with breakfast. , Disp: , Rfl:  .  meclizine (ANTIVERT) 25 MG tablet, , Disp: , Rfl:  .  Multiple Vitamin (MULTI-VITAMINS) TABS, Take 1 tablet by mouth daily., Disp: , Rfl:   ROS  Constitutional: Denies any fever or chills Gastrointestinal: No reported hemesis, hematochezia, vomiting, or acute GI distress Musculoskeletal: Denies any acute onset joint swelling, redness, loss of ROM, or weakness Neurological: No reported episodes of acute onset apraxia, aphasia, dysarthria, agnosia, amnesia, paralysis, loss of coordination, or loss of consciousness  Allergies  Ms.  Yzaguirre is allergic to adhesive [tape]; morphine; other; latex; and morphine and related.  PFSH  Drug: Ms. Friesen  reports that she does not use drugs. Alcohol:  reports that she does not drink alcohol. Tobacco:  reports that she has never smoked. She has never used smokeless tobacco. Medical:  has a past medical history of Anginal pain ([place]), Arthritis, CHF (congestive heart failure) ([place]), Diabetes ([place]), Diverticulitis, Hypertension, PE (pulmonary thromboembolism) (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 Surgical: Ms. Perdew  has a past surgical history that includes  Knee surgery; Carpal tunnel release (Bilateral); Colon resection sigmoid (N/A, 11/02/2016); Colostomy (N/A, 11/02/2016); Sigmoidoscopy (N/A, 11/13/2016); PULMONARY VENOGRAPHY (N/A, 11/19/2016); Colonoscopy with propofol (N/A, 02/03/2017); Colon surgery (11/02/2016); IVC FILTER INSERTION (Right, 10/2016); Colostomy reversal (N/A, 02/24/2017); Appendectomy (N/A, 02/24/2017); Lysis of adhesion (N/A, 02/24/2017); laparoscopy (N/A, 02/24/2017); Ileo loop diversion (N/A, 02/24/2017); Sigmoidoscopy (N/A, 05/19/2017); Ileostomy closure (N/A, 06/01/2017); IVC FILTER REMOVAL (N/A, 08/09/2017); and Sacroplasty (N/A, 11/08/2017). Family: family history includes Breast cancer in her cousin and mother; Cancer in her mother; Diabetes in her mother and sister; Heart disease in her father, mother, and sister.  Constitutional Exam  General appearance: Well nourished, well developed, and well hydrated. In no apparent acute distress Vitals:   02/13/18 0911  BP: 131/80  Pulse: 83  Resp: 18  Temp: 97.8 F (36.6 C)  TempSrc: Oral  SpO2: 97%  Weight: 218 lb (98.9 kg)  Height: '5\' 3"'  (1.6 m)   BMI Assessment: Estimated body mass index is 38.62 kg/m as calculated from the following:   Height as of this encounter: '5\' 3"'  (1.6 m).   Weight as of this encounter: 218 lb (98.9 kg).  BMI interpretation table: BMI level Category  Range association with higher incidence of chronic pain  <18 kg/m2 Underweight   18.5-24.9 kg/m2 Ideal body weight   25-29.9 kg/m2 Overweight Increased incidence by 20%  30-34.9 kg/m2 Obese (Class I) Increased incidence by 68%  35-39.9 kg/m2 Severe obesity (Class II) Increased incidence by 136%  >40 kg/m2 Extreme obesity (Class III) Increased incidence by 254%   Patient's current BMI Ideal Body weight  Body mass index is 38.62 kg/m. Ideal body weight: 52.4 kg (115 lb 8.3 oz) Adjusted ideal body weight: 71 kg (156 lb 8.2 oz)   BMI Readings from Last 4 Encounters:  02/13/18 38.62 kg/m  01/26/18 38.62 kg/m  01/18/18 38.62 kg/m  12/27/17 38.44 kg/m   Wt Readings from Last 4 Encounters:  02/13/18 218 lb (98.9 kg)  01/26/18 218 lb (98.9 kg)  01/18/18 218 lb (98.9 kg)  12/27/17 217 lb (98.4 kg)  Psych/Mental status: Alert, oriented x 3 (person, place, & time)       Eyes: PERLA Respiratory: No evidence of acute respiratory distress  Cervical Spine Area Exam  Skin & Axial Inspection: No masses, redness, edema, swelling, or associated skin lesions Alignment: Symmetrical Functional ROM: Unrestricted ROM      Stability: No instability detected Muscle Tone/Strength: Functionally intact. No obvious neuro-muscular anomalies detected. Sensory (Neurological): Unimpaired Palpation: No palpable anomalies              Upper Extremity (UE) Exam    Side: Right upper extremity  Side: Left upper extremity  Skin & Extremity Inspection: Skin color, temperature, and hair growth are WNL. No peripheral edema or cyanosis. No masses, redness, swelling, asymmetry, or associated skin lesions. No contractures.  Skin & Extremity Inspection: Skin color, temperature, and hair growth are WNL. No peripheral edema or cyanosis. No masses, redness, swelling, asymmetry, or associated skin lesions. No contractures.  Functional ROM: Unrestricted ROM          Functional ROM: Unrestricted ROM          Muscle  Tone/Strength: Functionally intact. No obvious neuro-muscular anomalies detected.  Muscle Tone/Strength: Functionally intact. No obvious neuro-muscular anomalies detected.  Sensory (Neurological): Unimpaired          Sensory (Neurological): Unimpaired          Palpation: No palpable anomalies  Palpation: No palpable anomalies              Provocative Test(s):  Phalen's test: deferred Tinel's test: deferred Apley's scratch test (touch opposite shoulder):  Action 1 (Across chest): deferred Action 2 (Overhead): deferred Action 3 (LB reach): deferred   Provocative Test(s):  Phalen's test: deferred Tinel's test: deferred Apley's scratch test (touch opposite shoulder):  Action 1 (Across chest): deferred Action 2 (Overhead): deferred Action 3 (LB reach): deferred    Thoracic Spine Area Exam  Skin & Axial Inspection: No masses, redness, or swelling Alignment: Symmetrical Functional ROM: Unrestricted ROM Stability: No instability detected Muscle Tone/Strength: Functionally intact. No obvious neuro-muscular anomalies detected. Sensory (Neurological): Unimpaired Muscle strength & Tone: No palpable anomalies  Lumbar Spine Area Exam  Skin & Axial Inspection: No masses, redness, or swelling Alignment: Symmetrical Functional ROM: Unrestricted ROM       Stability: No instability detected Muscle Tone/Strength: Functionally intact. No obvious neuro-muscular anomalies detected. Sensory (Neurological): Unimpaired Palpation: No palpable anomalies       Provocative Tests: Hyperextension/rotation test: deferred today       Lumbar quadrant test (Kemp's test): deferred today       Lateral bending test: deferred today       Patrick's Maneuver: deferred today                   FABER test: deferred today                   S-I anterior distraction/compression test: deferred today         S-I lateral compression test: deferred today         S-I Thigh-thrust test: deferred today         S-I  Gaenslen's test: deferred today          Gait & Posture Assessment  Ambulation: Patient ambulates using a cane Gait: Limited. Using assistive device to ambulate Posture: WNL   Lower Extremity Exam    Side: Right lower extremity  Side: Left lower extremity  Stability: No instability observed          Stability: No instability observed          Skin & Extremity Inspection: Skin color, temperature, and hair growth are WNL. No peripheral edema or cyanosis. No masses, redness, swelling, asymmetry, or associated skin lesions. No contractures.  Skin & Extremity Inspection: Skin color, temperature, and hair growth are WNL. No peripheral edema or cyanosis. No masses, redness, swelling, asymmetry, or associated skin lesions. No contractures.  Functional ROM: Unrestricted ROM                  Functional ROM: Unrestricted ROM                  Muscle Tone/Strength: Functionally intact. No obvious neuro-muscular anomalies detected.  Muscle Tone/Strength: Functionally intact. No obvious neuro-muscular anomalies detected.  Sensory (Neurological): Unimpaired  Sensory (Neurological): Unimpaired  Palpation: No palpable anomalies  Palpation: No palpable anomalies   Assessment  Primary Diagnosis & Pertinent Problem List: The primary encounter diagnosis was Chronic low back pain (Primary Area of Pain) (Bilateral). Diagnoses of Chronic sacroiliac joint pain (Secondary Area of Pain) (Right) and Lumbar facet syndrome (Bilateral) were also pertinent to this visit.  Status Diagnosis  Controlled Controlled Controlled 1. Chronic low back pain (Primary Area of Pain) (Bilateral)   2. Chronic sacroiliac joint pain (Secondary Area of Pain) (Right)   3. Lumbar facet syndrome (Bilateral)  Problems updated and reviewed during this visit: No problems updated. Plan of Care  Pharmacotherapy (Medications Ordered): No orders of the defined types were placed in this encounter.  Medications administered today: [name] had no medications administered during this visit.   Procedure Orders     LUMBAR FACET(MEDIAL BRANCH NERVE BLOCK) [facility] Lab Orders  No laboratory test(s) ordered today   Imaging Orders  No imaging studies ordered today   Referral Orders  No referral(s) requested today   Interventional management options: Planned, scheduled, and/or pending:   Diagnostic bilateral lumbar facet block #3 under fluoroscopic guidance and IV sedation (PRN)   Considering:   Diagnostic bilateral lumbar facet block Possible bilateral lumbar facet RFA Diagnostic L5-S1 LESI Diagnostic bilateral L3transforaminal ESI Diagnostic bilateral L4transforaminal ESI Diagnostic bilateral L5 transforaminal ESI Diagnostic right-sided sacroiliac joint block Possible right-sided sacroiliac joint RFA Diagnostic bilateral lumbar facet block Possible bilateral lumbar facet RFA Diagnostic right-sided intra-articular hip joint injection Diagnostic right-sided femoral nerve +Obturatornerve block Possible right-sided femoral nerve + obturator RFA   Palliative PRN treatment(s):   Diagnostic bilateral lumbar facet block #3 under fluoroscopic guidance and IV sedation    Provider-requested follow-up: Return for PRN Procedure: (B) L-FCT BLK #3.  No future appointments. Primary Care Physician: Tracie Harrier, MD Location: [facility] Outpatient Pain Management Facility Note by: Gaspar Cola, MD Date: 02/13/2018; Time: 10:36 AM

## 2018-02-13 ENCOUNTER — Encounter: Payer: Self-pay | Admitting: Pain Medicine

## 2018-02-13 ENCOUNTER — Ambulatory Visit: Payer: PPO | Attending: Pain Medicine | Admitting: Pain Medicine

## 2018-02-13 ENCOUNTER — Other Ambulatory Visit: Payer: Self-pay

## 2018-02-13 VITALS — BP 131/80 | HR 83 | Temp 97.8°F | Resp 18 | Ht 63.0 in | Wt 218.0 lb

## 2018-02-13 DIAGNOSIS — M5136 Other intervertebral disc degeneration, lumbar region: Secondary | ICD-10-CM | POA: Insufficient documentation

## 2018-02-13 DIAGNOSIS — K5730 Diverticulosis of large intestine without perforation or abscess without bleeding: Secondary | ICD-10-CM | POA: Insufficient documentation

## 2018-02-13 DIAGNOSIS — Z91040 Latex allergy status: Secondary | ICD-10-CM | POA: Insufficient documentation

## 2018-02-13 DIAGNOSIS — Z6838 Body mass index (BMI) 38.0-38.9, adult: Secondary | ICD-10-CM | POA: Diagnosis not present

## 2018-02-13 DIAGNOSIS — M533 Sacrococcygeal disorders, not elsewhere classified: Secondary | ICD-10-CM | POA: Insufficient documentation

## 2018-02-13 DIAGNOSIS — Z7984 Long term (current) use of oral hypoglycemic drugs: Secondary | ICD-10-CM | POA: Insufficient documentation

## 2018-02-13 DIAGNOSIS — Z7982 Long term (current) use of aspirin: Secondary | ICD-10-CM | POA: Insufficient documentation

## 2018-02-13 DIAGNOSIS — M47816 Spondylosis without myelopathy or radiculopathy, lumbar region: Secondary | ICD-10-CM | POA: Diagnosis not present

## 2018-02-13 DIAGNOSIS — Z79891 Long term (current) use of opiate analgesic: Secondary | ICD-10-CM | POA: Diagnosis not present

## 2018-02-13 DIAGNOSIS — M48061 Spinal stenosis, lumbar region without neurogenic claudication: Secondary | ICD-10-CM | POA: Insufficient documentation

## 2018-02-13 DIAGNOSIS — I110 Hypertensive heart disease with heart failure: Secondary | ICD-10-CM | POA: Diagnosis not present

## 2018-02-13 DIAGNOSIS — Z888 Allergy status to other drugs, medicaments and biological substances status: Secondary | ICD-10-CM | POA: Insufficient documentation

## 2018-02-13 DIAGNOSIS — M545 Low back pain: Secondary | ICD-10-CM

## 2018-02-13 DIAGNOSIS — Z833 Family history of diabetes mellitus: Secondary | ICD-10-CM | POA: Insufficient documentation

## 2018-02-13 DIAGNOSIS — Z9889 Other specified postprocedural states: Secondary | ICD-10-CM | POA: Diagnosis not present

## 2018-02-13 DIAGNOSIS — Z885 Allergy status to narcotic agent status: Secondary | ICD-10-CM | POA: Diagnosis not present

## 2018-02-13 DIAGNOSIS — G894 Chronic pain syndrome: Secondary | ICD-10-CM | POA: Insufficient documentation

## 2018-02-13 DIAGNOSIS — R8290 Unspecified abnormal findings in urine: Secondary | ICD-10-CM | POA: Diagnosis not present

## 2018-02-13 DIAGNOSIS — Z79899 Other long term (current) drug therapy: Secondary | ICD-10-CM | POA: Diagnosis not present

## 2018-02-13 DIAGNOSIS — E1142 Type 2 diabetes mellitus with diabetic polyneuropathy: Secondary | ICD-10-CM | POA: Diagnosis not present

## 2018-02-13 DIAGNOSIS — I10 Essential (primary) hypertension: Secondary | ICD-10-CM | POA: Diagnosis not present

## 2018-02-13 DIAGNOSIS — Z86711 Personal history of pulmonary embolism: Secondary | ICD-10-CM | POA: Insufficient documentation

## 2018-02-13 DIAGNOSIS — Z8249 Family history of ischemic heart disease and other diseases of the circulatory system: Secondary | ICD-10-CM | POA: Diagnosis not present

## 2018-02-13 DIAGNOSIS — M1611 Unilateral primary osteoarthritis, right hip: Secondary | ICD-10-CM | POA: Diagnosis not present

## 2018-02-13 DIAGNOSIS — Z803 Family history of malignant neoplasm of breast: Secondary | ICD-10-CM | POA: Diagnosis not present

## 2018-02-13 DIAGNOSIS — I5023 Acute on chronic systolic (congestive) heart failure: Secondary | ICD-10-CM | POA: Diagnosis not present

## 2018-02-13 DIAGNOSIS — G8929 Other chronic pain: Secondary | ICD-10-CM

## 2018-02-13 DIAGNOSIS — M5416 Radiculopathy, lumbar region: Secondary | ICD-10-CM | POA: Diagnosis not present

## 2018-02-13 DIAGNOSIS — E871 Hypo-osmolality and hyponatremia: Secondary | ICD-10-CM | POA: Insufficient documentation

## 2018-02-13 DIAGNOSIS — E119 Type 2 diabetes mellitus without complications: Secondary | ICD-10-CM | POA: Diagnosis not present

## 2018-02-13 NOTE — Patient Instructions (Signed)
____________________________________________________________________________________________  Preparing for Procedure with Sedation  Instructions: . Oral Intake: Do not eat or drink anything for at least 8 hours prior to your procedure. . Transportation: Public transportation is not allowed. Bring an adult driver. The driver must be physically present in our waiting room before any procedure can be started. Marland Kitchen Physical Assistance: Bring an adult physically capable of assisting you, in the event you need help. This adult should keep you company at home for at least 6 hours after the procedure. . Blood Pressure Medicine: Take your blood pressure medicine with a sip of water the morning of the procedure. . Blood thinners: Notify our staff if you are taking any blood thinners. Depending on which one you take, there will be specific instructions on how and when to stop it. . Diabetics on insulin: Notify the staff so that you can be scheduled 1st case in the morning. If your diabetes requires high dose insulin, take only  of your normal insulin dose the morning of the procedure and notify the staff that you have done so. . Preventing infections: Shower with an antibacterial soap the morning of your procedure. . Build-up your immune system: Take 1000 mg of Vitamin C with every meal (3 times a day) the day prior to your procedure. Marland Kitchen Antibiotics: Inform the staff if you have a condition or reason that requires you to take antibiotics before dental procedures. . Pregnancy: If you are pregnant, call and cancel the procedure. . Sickness: If you have a cold, fever, or any active infections, call and cancel the procedure. . Arrival: You must be in the facility at least 30 minutes prior to your scheduled procedure. . Children: Do not bring children with you. . Dress appropriately: Bring dark clothing that you would not mind if they get stained. . Valuables: Do not bring any jewelry or valuables.  Procedure  appointments are reserved for interventional treatments only. Marland Kitchen No Prescription Refills. . No medication changes will be discussed during procedure appointments. . No disability issues will be discussed.  Reasons to call and reschedule or cancel your procedure: (Following these recommendations will minimize the risk of a serious complication.) . Surgeries: Avoid having procedures within 2 weeks of any surgery. (Avoid for 2 weeks before or after any surgery). . Flu Shots: Avoid having procedures within 2 weeks of a flu shots or . (Avoid for 2 weeks before or after immunizations). . Barium: Avoid having a procedure within 7-10 days after having had a radiological study involving the use of radiological contrast. (Myelograms, Barium swallow or enema study). . Heart attacks: Avoid any elective procedures or surgeries for the initial 6 months after a "Myocardial Infarction" (Heart Attack). . Blood thinners: It is imperative that you stop these medications before procedures. Let us know if you if you take any blood thinner.  . Infection: Avoid procedures during or within two weeks of an infection (including chest colds or gastrointestinal problems). Symptoms associated with infections include: Localized redness, fever, chills, night sweats or profuse sweating, burning sensation when voiding, cough, congestion, stuffiness, runny nose, sore throat, diarrhea, nausea, vomiting, cold or Flu symptoms, recent or current infections. It is specially important if the infection is over the area that we intend to treat. Marland Kitchen Heart and lung problems: Symptoms that may suggest an active cardiopulmonary problem include: cough, chest pain, breathing difficulties or shortness of breath, dizziness, ankle swelling, uncontrolled high or unusually low blood pressure, and/or palpitations. If you are experiencing any of these symptoms, cancel  your procedure and contact your primary care physician for an evaluation.  Remember:   Regular Business hours are:  Monday to Thursday 8:00 AM to 4:00 PM  Provider's Schedule: Milinda Pointer, MD:  Procedure days: Tuesday and Thursday 7:30 AM to 4:00 PM  Gillis Santa, MD:  Procedure days: Monday and Wednesday 7:30 AM to 4:00 PM ____________________________________________________________________________________________

## 2018-02-13 NOTE — Progress Notes (Signed)
Safety precautions to be maintained throughout the outpatient stay will include: orient to surroundings, keep bed in low position, maintain call bell within reach at all times, provide assistance with transfer out of bed and ambulation.  

## 2018-02-22 DIAGNOSIS — I10 Essential (primary) hypertension: Secondary | ICD-10-CM | POA: Diagnosis not present

## 2018-02-22 DIAGNOSIS — N390 Urinary tract infection, site not specified: Secondary | ICD-10-CM | POA: Diagnosis not present

## 2018-02-22 DIAGNOSIS — Z6841 Body Mass Index (BMI) 40.0 and over, adult: Secondary | ICD-10-CM | POA: Diagnosis not present

## 2018-02-22 DIAGNOSIS — E1165 Type 2 diabetes mellitus with hyperglycemia: Secondary | ICD-10-CM | POA: Diagnosis not present

## 2018-02-22 DIAGNOSIS — Z86711 Personal history of pulmonary embolism: Secondary | ICD-10-CM | POA: Diagnosis not present

## 2018-02-22 DIAGNOSIS — M5416 Radiculopathy, lumbar region: Secondary | ICD-10-CM | POA: Diagnosis not present

## 2018-02-28 ENCOUNTER — Encounter: Payer: Self-pay | Admitting: Pain Medicine

## 2018-02-28 ENCOUNTER — Ambulatory Visit
Admission: RE | Admit: 2018-02-28 | Discharge: 2018-02-28 | Disposition: A | Discharge status: Home / Self Care | Payer: PPO | Source: Ambulatory Visit | Attending: Pain Medicine | Admitting: Pain Medicine

## 2018-02-28 ENCOUNTER — Ambulatory Visit (HOSPITAL_BASED_OUTPATIENT_CLINIC_OR_DEPARTMENT_OTHER): Payer: PPO | Admitting: Pain Medicine

## 2018-02-28 ENCOUNTER — Other Ambulatory Visit: Payer: Self-pay

## 2018-02-28 VITALS — BP 113/67 | HR 82 | Temp 97.9°F | Resp 16 | Ht 63.0 in | Wt 220.0 lb

## 2018-02-28 DIAGNOSIS — M47816 Spondylosis without myelopathy or radiculopathy, lumbar region: Secondary | ICD-10-CM

## 2018-02-28 DIAGNOSIS — M4316 Spondylolisthesis, lumbar region: Secondary | ICD-10-CM | POA: Diagnosis not present

## 2018-02-28 DIAGNOSIS — G8929 Other chronic pain: Secondary | ICD-10-CM

## 2018-02-28 DIAGNOSIS — Z888 Allergy status to other drugs, medicaments and biological substances status: Secondary | ICD-10-CM | POA: Diagnosis not present

## 2018-02-28 DIAGNOSIS — Z885 Allergy status to narcotic agent status: Secondary | ICD-10-CM | POA: Diagnosis not present

## 2018-02-28 DIAGNOSIS — M533 Sacrococcygeal disorders, not elsewhere classified: Secondary | ICD-10-CM

## 2018-02-28 DIAGNOSIS — Z9889 Other specified postprocedural states: Secondary | ICD-10-CM | POA: Diagnosis not present

## 2018-02-28 DIAGNOSIS — M545 Low back pain: Secondary | ICD-10-CM | POA: Insufficient documentation

## 2018-02-28 DIAGNOSIS — M47817 Spondylosis without myelopathy or radiculopathy, lumbosacral region: Secondary | ICD-10-CM | POA: Insufficient documentation

## 2018-02-28 DIAGNOSIS — M4310 Spondylolisthesis, site unspecified: Secondary | ICD-10-CM

## 2018-02-28 DIAGNOSIS — M4300 Spondylolysis, site unspecified: Secondary | ICD-10-CM

## 2018-02-28 DIAGNOSIS — Z91040 Latex allergy status: Secondary | ICD-10-CM

## 2018-02-28 MED ORDER — ROPIVACAINE HCL 2 MG/ML IJ SOLN
18.0000 mL | Freq: Once | INTRAMUSCULAR | Status: AC
  Administered 2018-02-28: 10 mL via PERINEURAL
  Filled 2018-02-28: qty 20

## 2018-02-28 MED ORDER — FENTANYL CITRATE (PF) 100 MCG/2ML IJ SOLN
25.0000 ug | INTRAMUSCULAR | Status: AC | PRN
  Administered 2018-02-28 (×2): 100 ug via INTRAVENOUS
  Filled 2018-02-28: qty 2

## 2018-02-28 MED ORDER — LACTATED RINGERS IV SOLN
1000.0000 mL | Freq: Once | INTRAVENOUS | Status: AC
  Administered 2018-02-28: 1000 mL via INTRAVENOUS

## 2018-02-28 MED ORDER — LIDOCAINE HCL 2 % IJ SOLN
20.0000 mL | Freq: Once | INTRAMUSCULAR | Status: AC
  Administered 2018-02-28: 400 mg
  Filled 2018-02-28: qty 20

## 2018-02-28 MED ORDER — TRIAMCINOLONE ACETONIDE 40 MG/ML IJ SUSP
80.0000 mg | Freq: Once | INTRAMUSCULAR | Status: DC
  Filled 2018-02-28: qty 2

## 2018-02-28 MED ORDER — MIDAZOLAM HCL 5 MG/5ML IJ SOLN
1.0000 mg | INTRAMUSCULAR | Status: DC | PRN
  Administered 2018-02-28: 3 mg via INTRAVENOUS
  Filled 2018-02-28: qty 5

## 2018-02-28 NOTE — Progress Notes (Signed)
Patient's Name: Kara [name]  MRN: [id number]  Referring Provider: Milinda Pointer, MD  DOB: 11-19-1950  PCP: Tracie Harrier, MD  DOS: 02/28/2018  Note by: Gaspar Cola, MD  Service setting: Ambulatory outpatient  Specialty: Interventional Pain Management  Patient type: Established  Location: [name] (AMB) Pain Management Facility  Visit type: Interventional Procedure   Primary Reason for Visit: Interventional Pain Management Treatment. CC: Back Pain (lower)  Procedure:          Anesthesia, Analgesia, Anxiolysis:  Type: Lumbar Facet, Medial Branch Block(s) #3  Primary Purpose: Diagnostic Region: Posterolateral Lumbosacral Spine Level: L2, L3, L4, L5, & S1 Medial Branch Level(s). Injecting these levels blocks the L3-4, L4-5, and L5-S1 lumbar facet joints. Laterality: Bilateral  Type: Moderate (Conscious) Sedation combined with Local Anesthesia Indication(s): Analgesia and Anxiety Route: Intravenous (IV) IV Access: Secured Sedation: Meaningful verbal contact was maintained at all times during the procedure  Local Anesthetic: Lidocaine 1-2%  Position: Prone   Indications: 1. Spondylosis without myelopathy or radiculopathy, lumbosacral region   2. Lumbar facet syndrome (Bilateral)   3. Lumbar facet arthropathy (L3-4, L4-5, and L5-S1) (Bilateral)   4. 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 Chronic low back pain (Primary Area of Pain) (Bilateral)    Pain Score: Pre-procedure: 9 /10 Post-procedure: 0-No pain/10  Pre-op Assessment:  Kara [name] is a 67 y.o. (year old), female patient, seen today for interventional treatment. She  has a past surgical history that includes Knee surgery; Carpal tunnel release (Bilateral); Colon resection sigmoid (N/A, 11/02/2016); Colostomy (N/A, 11/02/2016); Sigmoidoscopy (N/A, 11/13/2016); PULMONARY VENOGRAPHY (N/A, 11/19/2016); Colonoscopy with propofol (N/A, 02/03/2017); Colon surgery (11/02/2016); IVC  FILTER INSERTION (Right, 10/2016); Colostomy reversal (N/A, 02/24/2017); Appendectomy (N/A, 02/24/2017); Lysis of adhesion (N/A, 02/24/2017); laparoscopy (N/A, 02/24/2017); Ileo loop diversion (N/A, 02/24/2017); Sigmoidoscopy (N/A, 05/19/2017); Ileostomy closure (N/A, 06/01/2017); IVC FILTER REMOVAL (N/A, 08/09/2017); and Sacroplasty (N/A, 11/08/2017). Kara [name] has a current medication list which includes the following prescription(s): aspirin ec, candesartan, cyclobenzaprine, gabapentin, glipizide, meclizine, and multi-vitamins, and the following Facility-Administered Medications: midazolam and triamcinolone acetonide. Her primarily concern today is the Back Pain (lower)  Initial Vital Signs:  Pulse/HCG Rate: 82ECG Heart Rate: 83 Temp: 98 F (36.7 C) Resp: 18 BP: 122/82 SpO2: 96 %  BMI: Estimated body mass index is 38.97 kg/m as calculated from the following:   Height as of this encounter: 5\' 3"  (1.6 m).   Weight as of this encounter: 220 lb (99.8 kg).  Risk Assessment: Allergies: Reviewed. She is allergic to adhesive [tape]; morphine; other; latex; and morphine and related.  Allergy Precautions: Latex-free protocol activated Coagulopathies: Reviewed. None identified.  Blood-thinner therapy: None at this time Active Infection(s): Reviewed. None identified. Kara [name] is afebrile  Site Confirmation: Kara [name] was asked to confirm the procedure and laterality before marking the site Procedure checklist: Completed Consent: Before the procedure and under the influence of no sedative(s), amnesic(s), or anxiolytics, the patient was informed of the treatment options, risks and possible complications. To fulfill our ethical and legal obligations, as recommended by the American Medical Association's Code of Ethics, I have informed the patient of my clinical impression; the nature and purpose of the treatment or procedure; the risks, benefits, and possible complications of the  intervention; the alternatives, including doing nothing; the risk(s) and benefit(s) of the alternative treatment(s) or procedure(s); and the risk(s) and benefit(s) of doing nothing. The patient was provided information about the general risks and possible complications associated with the procedure. These  may include, but are not limited to: failure to achieve desired goals, infection, bleeding, organ or nerve damage, allergic reactions, paralysis, and death. In addition, the patient was informed of those risks and complications associated to Spine-related procedures, such as failure to decrease pain; infection (i.e.: Meningitis, epidural or intraspinal abscess); bleeding (i.e.: epidural hematoma, subarachnoid hemorrhage, or any other type of intraspinal or peri-dural bleeding); organ or nerve damage (i.e.: Any type of peripheral nerve, nerve root, or spinal cord injury) with subsequent damage to sensory, motor, and/or autonomic systems, resulting in permanent pain, numbness, and/or weakness of one or several areas of the body; allergic reactions; (i.e.: anaphylactic reaction); and/or death. Furthermore, the patient was informed of those risks and complications associated with the medications. These include, but are not limited to: allergic reactions (i.e.: anaphylactic or anaphylactoid reaction(s)); adrenal axis suppression; blood sugar elevation that in diabetics may result in ketoacidosis or comma; water retention that in patients with history of congestive heart failure may result in shortness of breath, pulmonary edema, and decompensation with resultant heart failure; weight gain; swelling or edema; medication-induced neural toxicity; particulate matter embolism and blood vessel occlusion with resultant organ, and/or nervous system infarction; and/or aseptic necrosis of one or more joints. Finally, the patient was informed that Medicine is not an exact science; therefore, there is also the possibility of  unforeseen or unpredictable risks and/or possible complications that may result in a catastrophic outcome. The patient indicated having understood very clearly. We have given the patient no guarantees and we have made no promises. Enough time was given to the patient to ask questions, all of which were answered to the patient's satisfaction. Ms. Spellman has indicated that she wanted to continue with the procedure. Attestation: I, the ordering provider, attest that I have discussed with the patient the benefits, risks, side-effects, alternatives, likelihood of achieving goals, and potential problems during recovery for the procedure that I have provided informed consent. Date  Time: 02/28/2018  8:41 AM  Pre-Procedure Preparation:  Monitoring: As per clinic protocol. Respiration, ETCO2, SpO2, BP, heart rate and rhythm monitor placed and checked for adequate function Safety Precautions: Patient was assessed for positional comfort and pressure points before starting the procedure. Time-out: I initiated and conducted the "Time-out" before starting the procedure, as per protocol. The patient was asked to participate by confirming the accuracy of the "Time Out" information. Verification of the correct person, site, and procedure were performed and confirmed by me, the nursing staff, and the patient. "Time-out" conducted as per Joint Commission's Universal Protocol (UP.01.01.01). Time: 0957  Description of Procedure:          Laterality: Bilateral. The procedure was performed in identical fashion on both sides. Levels:  L2, L3, L4, L5, & S1 Medial Branch Level(s) Area Prepped: Posterior Lumbosacral Region Prepping solution: ChloraPrep (2% chlorhexidine gluconate and 70% isopropyl alcohol) Safety Precautions: Aspiration looking for blood return was conducted prior to all injections. At no point did we inject any substances, as a needle was being advanced. Before injecting, the patient was told to  immediately notify me if she was experiencing any new onset of "ringing in the ears, or metallic taste in the mouth". No attempts were made at seeking any paresthesias. Safe injection practices and needle disposal techniques used. Medications properly checked for expiration dates. SDV (single dose vial) medications used. After the completion of the procedure, all disposable equipment used was discarded in the proper designated medical waste containers. Local Anesthesia: Protocol guidelines were followed. The patient  was positioned over the fluoroscopy table. The area was prepped in the usual manner. The time-out was completed. The target area was identified using fluoroscopy. A 12-in long, straight, sterile hemostat was used with fluoroscopic guidance to locate the targets for each level blocked. Once located, the skin was marked with an approved surgical skin marker. Once all sites were marked, the skin (epidermis, dermis, and hypodermis), as well as deeper tissues (fat, connective tissue and muscle) were infiltrated with a small amount of a short-acting local anesthetic, loaded on a 10cc syringe with a 25G, 1.5-in  Needle. An appropriate amount of time was allowed for local anesthetics to take effect before proceeding to the next step. Local Anesthetic: Lidocaine 2.0% The unused portion of the local anesthetic was discarded in the proper designated containers. Technical explanation of process:  L2 Medial Branch Nerve Block (MBB): The target area for the L2 medial branch is at the junction of the postero-lateral aspect of the superior articular process and the superior, posterior, and medial edge of the transverse process of L3. Under fluoroscopic guidance, a Quincke needle was inserted until contact was made with os over the superior postero-lateral aspect of the pedicular shadow (target area). After negative aspiration for blood, 0.5 mL of the nerve block solution was injected without difficulty or  complication. The needle was removed intact. L3 Medial Branch Nerve Block (MBB): The target area for the L3 medial branch is at the junction of the postero-lateral aspect of the superior articular process and the superior, posterior, and medial edge of the transverse process of L4. Under fluoroscopic guidance, a Quincke needle was inserted until contact was made with os over the superior postero-lateral aspect of the pedicular shadow (target area). After negative aspiration for blood, 0.5 mL of the nerve block solution was injected without difficulty or complication. The needle was removed intact. L4 Medial Branch Nerve Block (MBB): The target area for the L4 medial branch is at the junction of the postero-lateral aspect of the superior articular process and the superior, posterior, and medial edge of the transverse process of L5. Under fluoroscopic guidance, a Quincke needle was inserted until contact was made with os over the superior postero-lateral aspect of the pedicular shadow (target area). After negative aspiration for blood, 0.5 mL of the nerve block solution was injected without difficulty or complication. The needle was removed intact. L5 Medial Branch Nerve Block (MBB): The target area for the L5 medial branch is at the junction of the postero-lateral aspect of the superior articular process and the superior, posterior, and medial edge of the sacral ala. Under fluoroscopic guidance, a Quincke needle was inserted until contact was made with os over the superior postero-lateral aspect of the pedicular shadow (target area). After negative aspiration for blood, 0.5 mL of the nerve block solution was injected without difficulty or complication. The needle was removed intact. S1 Medial Branch Nerve Block (MBB): The target area for the S1 medial branch is at the posterior and inferior 6 o'clock position of the L5-S1 facet joint. Under fluoroscopic guidance, the Quincke needle inserted for the L5 MBB was  redirected until contact was made with os over the inferior and postero aspect of the sacrum, at the 6 o' clock position under the L5-S1 facet joint (Target area). After negative aspiration for blood, 0.5 mL of the nerve block solution was injected without difficulty or complication. The needle was removed intact. Procedural Needles: 22-gauge, 3.5-inch, Quincke needles used for all levels. Nerve block solution: 0.2% PF-Ropivacaine +  Triamcinolone (40 mg/mL) diluted to a final concentration of 4 mg of Triamcinolone/mL of Ropivacaine The unused portion of the solution was discarded in the proper designated containers.  Once the entire procedure was completed, the treated area was cleaned, making sure to leave some of the prepping solution back to take advantage of its long term bactericidal properties.   Illustration of the posterior view of the lumbar spine and the posterior neural structures. Laminae of L2 through S1 are labeled. DPRL5, dorsal primary ramus of L5; DPRS1, dorsal primary ramus of S1; DPR3, dorsal primary ramus of L3; FJ, facet (zygapophyseal) joint L3-L4; I, inferior articular process of L4; LB1, lateral branch of dorsal primary ramus of L1; IAB, inferior articular branches from L3 medial branch (supplies L4-L5 facet joint); IBP, intermediate branch plexus; MB3, medial branch of dorsal primary ramus of L3; NR3, third lumbar nerve root; S, superior articular process of L5; SAB, superior articular branches from L4 (supplies L4-5 facet joint also); TP3, transverse process of L3.  Vitals:   02/28/18 1011 02/28/18 1020 02/28/18 1031 02/28/18 1040  BP: 126/85 (!) 143/73 135/68 113/67  Pulse:      Resp: (!) 21 17 16 16   Temp:  98 F (36.7 C)  97.9 F (36.6 C)  TempSrc:      SpO2: 100% 98% 97% 96%  Weight:      Height:         Start Time: 0957 hrs. End Time: 1009 hrs.  Imaging Guidance (Spinal):          Type of Imaging Technique: Fluoroscopy Guidance (Spinal) Indication(s):  Assistance in needle guidance and placement for procedures requiring needle placement in or near specific anatomical locations not easily accessible without such assistance. Exposure Time: Please see nurses notes. Contrast: None used. Fluoroscopic Guidance: I was personally present during the use of fluoroscopy. "Tunnel Vision Technique" used to obtain the best possible view of the target area. Parallax error corrected before commencing the procedure. "Direction-depth-direction" technique used to introduce the needle under continuous pulsed fluoroscopy. Once target was reached, antero-posterior, oblique, and lateral fluoroscopic projection used confirm needle placement in all planes. Images permanently stored in EMR. Interpretation: No contrast injected. I personally interpreted the imaging intraoperatively. Adequate needle placement confirmed in multiple planes. Permanent images saved into the patient's record.  Antibiotic Prophylaxis:   Anti-infectives (From admission, onward)   None     Indication(s): None identified  Post-operative Assessment:  Post-procedure Vital Signs:  Pulse/HCG Rate: 8278 Temp: 97.9 F (36.6 C) Resp: 16 BP: 113/67 SpO2: 96 %  EBL: None  Complications: No immediate post-treatment complications observed by team, or reported by patient.  Note: The patient tolerated the entire procedure well. A repeat set of vitals were taken after the procedure and the patient was kept under observation following institutional policy, for this type of procedure. Post-procedural neurological assessment was performed, showing return to baseline, prior to discharge. The patient was provided with post-procedure discharge instructions, including a section on how to identify potential problems. Should any problems arise concerning this procedure, the patient was given instructions to immediately contact us, at any time, without hesitation. In any case, we plan to contact the patient by  telephone for a follow-up status report regarding this interventional procedure.  Comments:  No additional relevant information.  Plan of Care    Imaging Orders     DG C-Arm 1-60 Min-No Report  Procedure Orders     LUMBAR FACET(MEDIAL BRANCH NERVE BLOCK) MBNB  Medications ordered for  procedure: Meds ordered this encounter  Medications  . lidocaine (XYLOCAINE) 2 % (with pres) injection 400 mg  . midazolam (VERSED) 5 MG/5ML injection 1-2 mg    Make sure Flumazenil is available in the pyxis when using this medication. If oversedation occurs, administer 0.2 mg IV over 15 sec. If after 45 sec no response, administer 0.2 mg again over 1 min; may repeat at 1 min intervals; not to exceed 4 doses (1 mg)  . fentaNYL (SUBLIMAZE) injection 25-50 mcg    Make sure Narcan is available in the pyxis when using this medication. In the event of respiratory depression (RR< 8/min): Titrate NARCAN (naloxone) in increments of 0.1 to 0.2 mg IV at 2-3 minute intervals, until desired degree of reversal.  . lactated ringers infusion 1,000 mL  . ropivacaine (PF) 2 mg/mL (0.2%) (NAROPIN) injection 18 mL  . triamcinolone acetonide (KENALOG-40) injection 80 mg   Medications administered: We administered lidocaine, midazolam, fentaNYL, lactated ringers, and ropivacaine (PF) 2 mg/mL (0.2%).  See the medical record for exact dosing, route, and time of administration.  Disposition: Discharge home  Discharge Date & Time: 02/28/2018; 1040 hrs.   Physician-requested Follow-up: Return for post-procedure eval (2 wks), w/ Dr. Dossie Arbour.  Future Appointments  Date Time Provider [place]  03/15/2018  9:15 AM Milinda Pointer, MD [facility] None   Primary Care Physician: Tracie Harrier, MD Location: [facility] Note by: Gaspar Cola, MD Date: 02/28/2018; Time: 1:02 PM  Disclaimer:  Medicine is not an Chief Strategy Officer. The only guarantee in medicine is that nothing is  guaranteed. It is important to note that the decision to proceed with this intervention was based on the information collected from the patient. The Data and conclusions were drawn from the patient's questionnaire, the interview, and the physical examination. Because the information was provided in large part by the patient, it cannot be guaranteed that it has not been purposely or unconsciously manipulated. Every effort has been made to obtain as much relevant data as possible for this evaluation. It is important to note that the conclusions that lead to this procedure are derived in large part from the available data. Always take into account that the treatment will also be dependent on availability of resources and existing treatment guidelines, considered by other Pain Management Practitioners as being common knowledge and practice, at the time of the intervention. For Medico-Legal purposes, it is also important to point out that variation in procedural techniques and pharmacological choices are the acceptable norm. The indications, contraindications, technique, and results of the above procedure should only be interpreted and judged by a Board-Certified Interventional Pain Specialist with extensive familiarity and expertise in the same exact procedure and technique.

## 2018-02-28 NOTE — Patient Instructions (Addendum)
___Instructed not to drive, drink alcohol or make any legal decisions today.  ________________________________________________________________________________________  Post-Procedure Discharge Instructions  Instructions:  Apply ice: Fill a plastic sandwich bag with crushed ice. Cover it with a small towel and apply to injection site. Apply for 15 minutes then remove x 15 minutes. Repeat sequence on day of procedure, until you go to bed. The purpose is to minimize swelling and discomfort after procedure.  Apply heat: Apply heat to procedure site starting the day following the procedure. The purpose is to treat any soreness and discomfort from the procedure.  Food intake: Start with clear liquids (like water) and advance to regular food, as tolerated.   Physical activities: Keep activities to a minimum for the first 8 hours after the procedure.   Driving: If you have received any sedation, you are not allowed to drive for 24 hours after your procedure.  Blood thinner: Restart your blood thinner 6 hours after your procedure. (Only for those taking blood thinners)  Insulin: As soon as you can eat, you may resume your normal dosing schedule. (Only for those taking insulin)  Infection prevention: Keep procedure site clean and dry.  Post-procedure Pain Diary: Extremely important that this be done correctly and accurately. Recorded information will be used to determine the next step in treatment.  Pain evaluated is that of treated area only. Do not include pain from an untreated area.  Complete every hour, on the hour, for the initial 8 hours. Set an alarm to help you do this part accurately.  Do not go to sleep and have it completed later. It will not be accurate.  Follow-up appointment: Keep your follow-up appointment after the procedure. Usually 2 weeks for most procedures. (6 weeks in the case of radiofrequency.) Bring you pain diary.   Expect:  From numbing medicine (AKA: Local  Anesthetics): Numbness or decrease in pain.  Onset: Full effect within 15 minutes of injected.  Duration: It will depend on the type of local anesthetic used. On the average, 1 to 8 hours.   From steroids: Decrease in swelling or inflammation. Once inflammation is improved, relief of the pain will follow.  Onset of benefits: Depends on the amount of swelling present. The more swelling, the longer it will take for the benefits to be seen. In some cases, up to 10 days.  Duration: Steroids will stay in the system x 2 weeks. Duration of benefits will depend on multiple posibilities including persistent irritating factors.  Occasional side-effects: Facial flushing, cramps (if present, drink Gatorade and take over-the-counter Magnesium 450-500 mg once to twice a day).  From procedure: Some discomfort is to be expected once the numbing medicine wears off. This should be minimal if ice and heat are applied as instructed.  Call if:  You experience numbness and weakness that gets worse with time, as opposed to wearing off.  New onset bowel or bladder incontinence. (This applies to Spinal procedures only)  Emergency Numbers:  Durning business hours (Monday - Thursday, 8:00 AM - 4:00 PM) (Friday, 9:00 AM - 12:00 Noon): [phone]  After hours: [phone] ____________________________________________________________________________________________  Post-procedure Information What to expect: Most procedures involve the use of a local anesthetic (numbing medicine), and a steroid (anti-inflammatory medicine).  The local anesthetics may cause temporary numbness and weakness of the legs or arms, depending on the location of the block. This numbness/weakness may last 4-6 hours, depending on the local anesthetic used. In rare instances, it can last up to 24 hours. While  numb, you must be very careful not to injure the extremity.  After any procedure, you could expect the pain to get better  within 15-20 minutes. This relief is temporary and may last 4-6 hours. Once the local anesthetics wears off, you could experience discomfort, possibly more than usual, for up to 10 (ten) days. In the case of radiofrequencies, it may last up to 6 weeks. Surgeries may take up to 8 weeks for the healing process. The discomfort is due to the irritation caused by needles going through skin and muscle. To minimize the discomfort, we recommend using ice the first day, and heat from then on. The ice should be applied for 15 minutes on, and 15 minutes off. Keep repeating this cycle until bedtime. Avoid applying the ice directly to the skin, to prevent frostbite. Heat should be used daily, until the pain improves (4-10 days). Be careful not to burn yourself.  Occasionally you may experience muscle spasms or cramps. These occur as a consequence of the irritation caused by the needle sticks to the muscle and the blood that will inevitably be lost into the surrounding muscle tissue. Blood tends to be very irritating to tissues, which tend to react by going into spasm. These spasms may start the same day of your procedure, but they may also take days to develop. This late onset type of spasm or cramp is usually caused by electrolyte imbalances triggered by the steroids, at the level of the kidney. Cramps and spasms tend to respond well to muscle relaxants, multivitamins (some are triggered by the procedure, but may have their origins in vitamin deficiencies), and "Gatorade", or any sports drinks that can replenish any electrolyte imbalances. (If you are a diabetic, ask your pharmacist to get you a sugar-free brand.) Warm showers or baths may also be helpful. Stretching exercises are highly recommended. General Instructions:  Be alert for signs of possible infection: redness, swelling, heat, red streaks, elevated temperature, and/or fever. These typically appear 4 to 6 days after the procedure. Immediately notify your doctor  if you experience unusual bleeding, difficulty breathing, or loss of bowel or bladder control. If you experience increased pain, do not increase your pain medicine intake, unless instructed by your pain physician. Post-Procedure Care:  Be careful in moving about. Muscle spasms in the area of the injection may occur. Applying ice or heat to the area is often helpful. The incidence of spinal headaches after epidural injections ranges between 1.4% and 6%. If you develop a headache that does not seem to respond to conservative therapy, please let your physician know. This can be treated with an epidural blood patch.   Post-procedure numbness or redness is to be expected, however it should average 4 to 6 hours. If numbness and weakness of your extremities begins to develop 4 to 6 hours after your procedure, and is felt to be progressing and worsening, immediately contact your physician.   Diet:  If you experience nausea, do not eat until this sensation goes away. If you had a "Stellate Ganglion Block" for upper extremity "Reflex Sympathetic Dystrophy", do not eat or drink until your hoarseness goes away. In any case, always start with liquids first and if you tolerate them well, then slowly progress to more solid foods. Activity:  For the first 4 to 6 hours after the procedure, use caution in moving about as you may experience numbness and/or weakness. Use caution in cooking, using household electrical appliances, and climbing steps. If you need to reach your  Doctor call our office: [phone] Monday-Thursday 8:00 am - 4:00 PM    Fridays: Closed     In case of an emergency: In case of emergency, call 911 or go to the nearest emergency room and have the physician there call us.  Interpretation of Procedure Every nerve block has two components: a diagnostic component, and a treatment component. Unrealistic expectations are the most common causes of "perceived failure".  In a perfect world, a single  nerve block should be able to completely and permanently eliminate the pain. Sadly, the world is not perfect.  Most pain management nerve blocks are performed using local anesthetics and steroids. Steroids are responsible for any long-term benefit that you may experience. Their purpose is to decrease any chronic swelling that may exist in the area. Steroids begin to work immediately after being injected. However, most patients will not experience any benefits until 5 to 10 days after the injection, when the swelling has come down to the point where they can tell a difference. Steroids will only help if there is swelling to be treated. As such, they can assist with the diagnosis. If effective, they suggest an inflammatory component to the pain, and if ineffective, they rule out inflammation as the main cause or component of the problem. If the problem is one of mechanical compression, you will get no benefit from those steroids.   In the case of local anesthetics, they have a crucial role in the diagnosis of your condition. Most will begin to work within15 to 20 minutes after injection. The duration will depend on the type used (short- vs. Long-acting). It is of outmost importance that patients keep tract of their pain, after the procedure. To assist with this matter, a "Post-procedure Pain Diary" is provided. Make sure to complete it and to bring it back to your follow-up appointment.  As long as the patient keeps accurate, detailed records of their symptoms after every procedure, and returns to have those interpreted, every procedure will provide [place] with invaluable information. Even a block that does not provide the patient with any relief, will always provide [place] with information about the mechanism and the origin of the pain. The only time a nerve block can be considered a waste of time is when patients do not keep track of the results, or do not keep their post-procedure appointment.  Reporting the  results back to your physician The Pain Score  Pain is a subjective complaint. It cannot be seen, touched, or measured. We depend entirely on the patient's report of the pain in order to assess your condition and treatment. To evaluate the pain, we use a pain scale, where "0" means "No Pain", and a "10" is "the worst possible pain that you can even imagine" (i.e. something like been eaten alive by a shark or being torn apart by a lion).   You will frequently be asked to rate your pain. Please be as accurate, remember that medical decisions will be based on your responses. Please do not rate your pain above a 10. Doing so is actually interpreted as "symptom magnification" (exaggeration), as well as lack of understanding with regards to the scale. To put this into perspective, when you tell us that your pain is at a 10 (ten), what you are saying is that there is nothing we can do to make this pain any worse. (Carefully think about that.)

## 2018-02-28 NOTE — Progress Notes (Signed)
Safety precautions to be maintained throughout the outpatient stay will include: orient to surroundings, keep bed in low position, maintain call bell within reach at all times, provide assistance with transfer out of bed and ambulation.  

## 2018-03-01 ENCOUNTER — Telehealth: Payer: Self-pay | Admitting: *Deleted

## 2018-03-01 NOTE — Telephone Encounter (Signed)
No problems post procedure. 

## 2018-03-14 NOTE — Progress Notes (Signed)
Patient's Name: Kara [name]  MRN: [id number]  Referring Provider: Tracie Harrier, MD  DOB: 1950/08/08  PCP: Tracie Harrier, MD  DOS: 03/15/2018  Note by: Gaspar Cola, MD  Service setting: Ambulatory outpatient  Specialty: Interventional Pain Management  Location: [name] (AMB) Pain Management Facility    Patient type: Established   Primary Reason(s) for Visit: Encounter for post-procedure evaluation of chronic illness with mild to moderate exacerbation CC: Other (buttock)  HPI  Kara [name] is a 67 y.o. year old, female patient, who comes today for a post-procedure evaluation. She has Diabetes mellitus type 2 in obese ([place]); Type 2 diabetes mellitus without complication, without long-term current use of insulin ([place]); BP (high blood pressure); Nocturnal leg cramps; Cough; Chronic hip pain (Left); Acute non-recurrent maxillary sinusitis; Diverticulitis large intestine; Diverticulitis; DDD (degenerative disc disease), lumbar; Benign essential hypertension; Edema of both legs; Morbidly obese ([place]); Acute on chronic systolic (congestive) heart failure ([place]); HCAP (healthcare-associated pneumonia); Encounter for removal of staples; Other pulmonary embolism without acute cor pulmonale ([place]); History of colostomy reversal; Ileostomy in place [facility]); Positive result for methicillin resistant Staphylococcus aureus (MRSA) screening; H/O ileostomy; Diverticulosis of colon; History of pulmonary embolism; S/P IVC filter; Chronic low back pain (Primary Area of Pain) (Bilateral) w/ sciatica (Bilateral); Chronic sacroiliac joint pain (Secondary Area of Pain) (Right); Chronic pain syndrome; Long term current use of opiate analgesic; Pharmacologic therapy; Disorder of skeletal system; Problems influencing health status; Hyponatremia; Hypocalcemia; 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-S1 anterolisthesis.; Lumbar foraminal stenosis (L3-4 and L4-5) (Bilateral); Lumbosacral L5-S1 subarticular lateral recess  stenosis (Bilateral); Lumbar facet arthropathy (L3-4, L4-5, and L5-S1) (Bilateral); Lumbar facet syndrome (Bilateral); 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); Lumbar pars defect (L3 and L4) (Bilateral); Diabetic peripheral neuropathy ([place]); Chronic lower extremity pain (Secondary Area of Pain) (Bilateral); Chronic radicular pain of lower extremity; Osteoarthritis of hip (Right); History of allergy to latex; Chronic low back pain (Primary Area of Pain) (Bilateral); Spondylosis without myelopathy or radiculopathy, lumbosacral region; Other specified dorsopathies, sacral and sacrococcygeal region; Morbid obesity with BMI of 45.0-49.9, adult ([place]); Secondary osteoarthritis of multiple sites; and Sacral insufficiency fracture on their problem list. Her primarily concern today is the Other (buttock)  Pain Assessment: Location: Right Buttocks(right) Radiating: Denies Onset: More than a month ago Duration: Chronic pain Quality: Dull Severity: 8 /10 (subjective, self-reported pain score)  Note: Reported level is inconsistent with clinical observations. Clinically the patient looks like a 2/10 A 2/10 is viewed as "Mild to Moderate" and described as noticeable and distracting. Impossible to hide from other people. More frequent flare-ups. Still possible to adapt and function close to normal. It can be very annoying and may have occasional stronger flare-ups. With discipline, patients may get used to it and adapt. Information on the proper use of the pain scale provided to the patient today. When using our objective Pain Scale, levels between 6 and 10/10 are said to belong in an emergency room, as it progressively worsens from a 6/10, described as severely limiting, requiring emergency care not usually available at an outpatient pain management facility. At a 6/10 level, communication becomes difficult and requires great effort. Assistance to reach the emergency department may be required. Facial  flushing and profuse sweating along with potentially dangerous increases in heart rate and blood pressure will be evident. Effect on ADL: prolonged standing Timing: Constant Modifying factors: rest and sit down BP: 113/68  HR: 87  Kara [name] comes in today for post-procedure evaluation.  The patient comes  into the clinic today indicating excellent relief of the low back pain from that bilateral lumbar facet block.  This again confirms that what we are dealing with is an osteoarthritis of the lumbar facets.  I have passed this information on to the patient.  I have also informed the patient that this is associated with the patient's weight.  Today we spent a great deal of time talking about her BMI and what she needs to do to bring it down.  I have arranged for the patient to get a referral to a nutritionist for a medically supervised weight regimen.  In addition, I have requested a referral for bariatric surgery so that she can explore this option as well.  At this point, the patient is not a good candidate for radiofrequency ablation secondary to her weight.  Once she brings her BMI below 35, then she may be a good candidate.  She has indicated that she is working on her weight and she has lost 2 pounds.  Unfortunately, at this point, this is not enough that she needs to work on this more aggressively.  They lumbar facet block completely eliminated the pain in her lower back, however she still having pain in the tailbone area secondary to her fracture.  I will bring her back for a diagnostic caudal epidural steroid injection to see if this can provide her with some relief of the pain.  Further details on both, my assessment(s), as well as the proposed treatment plan, please see below.  Post-Procedure Assessment  02/28/2018 Procedure: Diagnostic bilateral lumbar facet block #3under fluoroscopic guidance and IV sedation Pre-procedure pain score:  9/10 Post-procedure pain score: 0/10 (100%  relief) Influential Factors: BMI: 38.62 kg/m Intra-procedural challenges: None observed.         Assessment challenges: None detected.              Reported side-effects: None.        Post-procedural adverse reactions or complications: None reported         Sedation: Sedation provided. When no sedatives are used, the analgesic levels obtained are directly associated to the effectiveness of the local anesthetics. However, when sedation is provided, the level of analgesia obtained during the initial 1 hour following the intervention, is believed to be the result of a combination of factors. These factors may include, but are not limited to: 1. The effectiveness of the local anesthetics used. 2. The effects of the analgesic(s) and/or anxiolytic(s) used. 3. The degree of discomfort experienced by the patient at the time of the procedure. 4. The patients ability and reliability in recalling and recording the events. 5. The presence and influence of possible secondary gains and/or psychosocial factors. Reported result: Relief experienced during the 1st hour after the procedure: 100 % (Ultra-Short Term Relief)            Interpretative annotation: Clinically appropriate result. Analgesia during this period is likely to be Local Anesthetic and/or IV Sedative (Analgesic/Anxiolytic) related.          Effects of local anesthetic: The analgesic effects attained during this period are directly associated to the localized infiltration of local anesthetics and therefore cary significant diagnostic value as to the etiological location, or anatomical origin, of the pain. Expected duration of relief is directly dependent on the pharmacodynamics of the local anesthetic used. Long-acting (4-6 hours) anesthetics used.  Reported result: Relief during the next 4 to 6 hour after the procedure: 100 % (Short-Term Relief)  Interpretative annotation: Clinically appropriate result. Analgesia during this period is  likely to be Local Anesthetic-related.          Long-term benefit: Defined as the period of time past the expected duration of local anesthetics (1 hour for short-acting and 4-6 hours for long-acting). With the possible exception of prolonged sympathetic blockade from the local anesthetics, benefits during this period are typically attributed to, or associated with, other factors such as analgesic sensory neuropraxia, antiinflammatory effects, or beneficial biochemical changes provided by agents other than the local anesthetics.  Reported result: Extended relief following procedure: 100 % (Long-Term Relief)            Interpretative annotation: Clinically possible results. Good relief. No permanent benefit expected. Inflammation plays a part in the etiology to the pain. Etiology is likely inflammatory and mechanical.  Current benefits: Defined as reported results that persistent at this point in time.   Analgesia: 90 % Kara [name] reports improvement of axial symptoms. Function: Somewhat improved ROM: Somewhat improved Interpretative annotation: Ongoing benefit. Therapeutic benefit observed. Effective therapeutic approach.          Interpretation: Results would suggest a successful diagnostic intervention.                  Plan:  Although the patient's lower back is doing much better, she still having pain in the tailbone secondary to her sacral fracture.  We will go ahead and plan on bringing her in for a caudal epidural steroid injection for the treatment of that area.  Today we took the time to explain to the patient about the plan, including the risk and possible complications of the procedure.  The patient understood and accepted.                Laboratory Chemistry  Inflammation Markers (CRP: Acute Phase) (ESR: Chronic Phase) Lab Results  Component Value Date   CRP 5.3 (H) 11/02/2017   ESRSEDRATE 12 11/02/2017                         Rheumatology Markers No results  found.  Renal Markers Lab Results  Component Value Date   BUN 19 10/03/2017   CREATININE 0.63 10/03/2017   GFRAA >60 10/03/2017   GFRNONAA >60 10/03/2017                             Hepatic Markers Lab Results  Component Value Date   AST 40 04/21/2017   ALT 53 04/21/2017   ALBUMIN 3.8 04/21/2017                        Neuropathy Markers Lab Results  Component Value Date   VITAMINB12 367 11/02/2017   HGBA1C 7.2 (H) 02/17/2017   HIV Non Reactive 11/18/2016                        Hematology Parameters Lab Results  Component Value Date   PLT 200 10/03/2017   HGB 16.5 (H) 10/03/2017   HCT 49.7 (H) 10/03/2017                        CV Markers Lab Results  Component Value Date   BNP 60.0 11/17/2016   CKTOTAL 71 10/02/2011   CKMB < 0.5 (L) 10/02/2011   TROPONINI <0.03 11/17/2016  Note: Lab results reviewed.  Recent Imaging Results   Results for orders placed in visit on 02/28/18  DG C-Arm 1-60 Min-No Report   Narrative Fluoroscopy was utilized by the requesting physician.  No radiographic  interpretation.    Interpretation Report: Fluoroscopy was used during the procedure to assist with needle guidance. The images were interpreted intraoperatively by the requesting physician.  Meds   Current Outpatient Medications:  .  aspirin EC 81 MG tablet, Take 81 mg by mouth daily., Disp: , Rfl:  .  candesartan (ATACAND) 16 MG tablet, Take 8 mg by mouth daily. , Disp: , Rfl:  .  cyclobenzaprine (FLEXERIL) 10 MG tablet, Take 10 mg by mouth at bedtime. , Disp: , Rfl:  .  gabapentin (NEURONTIN) 100 MG capsule, Take 200 mg by mouth 3 (three) times daily. , Disp: , Rfl:  .  glipiZIDE (GLUCOTROL XL) 5 MG 24 hr tablet, Take 5 mg by mouth daily with breakfast. , Disp: , Rfl:  .  meclizine (ANTIVERT) 25 MG tablet, , Disp: , Rfl:  .  Multiple Vitamin (MULTI-VITAMINS) TABS, Take 1 tablet by mouth daily., Disp: , Rfl:  .  rOPINIRole (REQUIP) 0.5 MG tablet, Take  0.5 mg by mouth at bedtime., Disp: , Rfl:   ROS  Constitutional: Denies any fever or chills Gastrointestinal: No reported hemesis, hematochezia, vomiting, or acute GI distress Musculoskeletal: Denies any acute onset joint swelling, redness, loss of ROM, or weakness Neurological: No reported episodes of acute onset apraxia, aphasia, dysarthria, agnosia, amnesia, paralysis, loss of coordination, or loss of consciousness  Allergies  Kara [name] is allergic to adhesive [tape]; morphine; other; latex; and morphine and related.  PFSH  Drug: Kara [name]  reports that she does not use drugs. Alcohol:  reports that she does not drink alcohol. Tobacco:  reports that she has never smoked. She has never used smokeless tobacco. 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 Surgical: Kara [name]  has a past surgical history that includes Knee surgery; Carpal tunnel release (Bilateral); Colon resection sigmoid (N/A, 11/02/2016); Colostomy (N/A, 11/02/2016); Sigmoidoscopy (N/A, 11/13/2016); PULMONARY VENOGRAPHY (N/A, 11/19/2016); Colonoscopy with propofol (N/A, 02/03/2017); Colon surgery (11/02/2016); IVC FILTER INSERTION (Right, 10/2016); Colostomy reversal (N/A, 02/24/2017); Appendectomy (N/A, 02/24/2017); Lysis of adhesion (N/A, 02/24/2017); laparoscopy (N/A, 02/24/2017); Ileo loop diversion (N/A, 02/24/2017); Sigmoidoscopy (N/A, 05/19/2017); Ileostomy closure (N/A, 06/01/2017); IVC FILTER REMOVAL (N/A, 08/09/2017); and Sacroplasty (N/A, 11/08/2017). Family: family history includes Breast cancer in her cousin and mother; Cancer in her mother; Diabetes in her mother and sister; Heart disease in her father, mother, and sister.  Constitutional Exam  General appearance: Well nourished, well developed, and well  hydrated. In no apparent acute distress Vitals:   03/15/18 0922  BP: 113/68  Pulse: 87  Temp: 97.7 F (36.5 C)  SpO2: 98%  Weight: 218 lb (98.9 kg)  Height: _0  (1.6 m)   BMI Assessment: Estimated body mass index is 38.62 kg/m as calculated from the following:   Height as of this encounter: _1  (1.6 m).   Weight as of this encounter: 218 lb (98.9 kg).  BMI interpretation table: BMI level Category Range association with higher incidence of chronic pain  <18 kg/m2 Underweight   18.5-24.9 kg/m2 Ideal body weight   25-29.9 kg/m2 Overweight Increased incidence by 20%  30-34.9 kg/m2 Obese (Class I) Increased incidence by 68%  35-39.9  kg/m2 Severe obesity (Class II) Increased incidence by 136%  >40 kg/m2 Extreme obesity (Class III) Increased incidence by 254%   Patient's current BMI Ideal Body weight  Body mass index is 38.62 kg/m. Ideal body weight: 52.4 kg (115 lb 8.3 oz) Adjusted ideal body weight: 71 kg (156 lb 8.2 oz)   BMI Readings from Last 4 Encounters:  03/15/18 38.62 kg/m  02/28/18 38.97 kg/m  02/13/18 38.62 kg/m  01/26/18 38.62 kg/m   Wt Readings from Last 4 Encounters:  03/15/18 218 lb (98.9 kg)  02/28/18 220 lb (99.8 kg)  02/13/18 218 lb (98.9 kg)  01/26/18 218 lb (98.9 kg)  Psych/Mental status: Alert, oriented x 3 (person, place, & time)       Eyes: PERLA Respiratory: No evidence of acute respiratory distress  Cervical Spine Area Exam  Skin & Axial Inspection: No masses, redness, edema, swelling, or associated skin lesions Alignment: Symmetrical Functional ROM: Unrestricted ROM      Stability: No instability detected Muscle Tone/Strength: Functionally intact. No obvious neuro-muscular anomalies detected. Sensory (Neurological): Unimpaired Palpation: No palpable anomalies              Upper Extremity (UE) Exam    Side: Right upper extremity  Side: Left upper extremity  Skin & Extremity Inspection: Skin color, temperature, and hair growth are WNL.  No peripheral edema or cyanosis. No masses, redness, swelling, asymmetry, or associated skin lesions. No contractures.  Skin & Extremity Inspection: Skin color, temperature, and hair growth are WNL. No peripheral edema or cyanosis. No masses, redness, swelling, asymmetry, or associated skin lesions. No contractures.  Functional ROM: Unrestricted ROM          Functional ROM: Unrestricted ROM          Muscle Tone/Strength: Functionally intact. No obvious neuro-muscular anomalies detected.  Muscle Tone/Strength: Functionally intact. No obvious neuro-muscular anomalies detected.  Sensory (Neurological): Unimpaired          Sensory (Neurological): Unimpaired          Palpation: No palpable anomalies              Palpation: No palpable anomalies              Provocative Test(s):  Phalen's test: deferred Tinel's test: deferred Apley's scratch test (touch opposite shoulder):  Action 1 (Across chest): deferred Action 2 (Overhead): deferred Action 3 (LB reach): deferred   Provocative Test(s):  Phalen's test: deferred Tinel's test: deferred Apley's scratch test (touch opposite shoulder):  Action 1 (Across chest): deferred Action 2 (Overhead): deferred Action 3 (LB reach): deferred    Thoracic Spine Area Exam  Skin & Axial Inspection: No masses, redness, or swelling Alignment: Symmetrical Functional ROM: Unrestricted ROM Stability: No instability detected Muscle Tone/Strength: Functionally intact. No obvious neuro-muscular anomalies detected. Sensory (Neurological): Unimpaired Muscle strength & Tone: No palpable anomalies  Lumbar Spine Area Exam  Skin & Axial Inspection: No masses, redness, or swelling Alignment: Symmetrical Functional ROM: Diminished ROM       Stability: No instability detected Muscle Tone/Strength: Functionally intact. No obvious neuro-muscular anomalies detected. Sensory (Neurological): Movement-associated discomfort Palpation: Complains of area being tender to palpation        Provocative Tests: Hyperextension/rotation test: deferred today       Lumbar quadrant test (Kemp's test): deferred today       Lateral bending test: deferred today       Patrick's Maneuver: deferred today  FABER test: deferred today                   S-I anterior distraction/compression test: deferred today         S-I lateral compression test: deferred today         S-I Thigh-thrust test: deferred today         S-I Gaenslen's test: deferred today          Gait & Posture Assessment  Ambulation: Patient ambulates using a cane Gait: Modified gait pattern (slower gait speed, wider stride width, and longer stance duration) associated with morbid obesity Posture: Antalgic   Lower Extremity Exam    Side: Right lower extremity  Side: Left lower extremity  Stability: No instability observed          Stability: No instability observed          Skin & Extremity Inspection: Skin color, temperature, and hair growth are WNL. No peripheral edema or cyanosis. No masses, redness, swelling, asymmetry, or associated skin lesions. No contractures.  Skin & Extremity Inspection: Skin color, temperature, and hair growth are WNL. No peripheral edema or cyanosis. No masses, redness, swelling, asymmetry, or associated skin lesions. No contractures.  Functional ROM: Unrestricted ROM                  Functional ROM: Unrestricted ROM                  Muscle Tone/Strength: Functionally intact. No obvious neuro-muscular anomalies detected.  Muscle Tone/Strength: Functionally intact. No obvious neuro-muscular anomalies detected.  Sensory (Neurological): Unimpaired  Sensory (Neurological): Unimpaired  Palpation: No palpable anomalies  Palpation: No palpable anomalies   Assessment  Primary Diagnosis & Pertinent Problem List: The primary encounter diagnosis was Chronic low back pain (Primary Area of Pain) (Bilateral). Diagnoses of Chronic lower extremity pain (Secondary Area of Pain) (Bilateral),  Lumbar facet syndrome (Bilateral), Morbid obesity with BMI of 45.0-49.9, adult ([place]), Secondary osteoarthritis of multiple sites, and Sacral insufficiency fracture were also pertinent to this visit.  Status Diagnosis  Controlled Controlled Controlled 1. Chronic low back pain (Primary Area of Pain) (Bilateral)   2. Chronic lower extremity pain (Secondary Area of Pain) (Bilateral)   3. Lumbar facet syndrome (Bilateral)   4. Morbid obesity with BMI of 45.0-49.9, adult ([place])   5. Secondary osteoarthritis of multiple sites   6. Sacral insufficiency fracture     Problems updated and reviewed during this visit: Problem  Secondary Osteoarthritis of Multiple Sites  Sacral insufficiency fracture  Morbid Obesity With Bmi of 45.0-49.9, Adult ([facility])   Plan of Care  Pharmacotherapy (Medications Ordered): No orders of the defined types were placed in this encounter.  Medications administered today: [name] had no medications administered during this visit.   Procedure Orders     Caudal Epidural Injection Lab Orders  No laboratory test(s) ordered today   Imaging Orders  No imaging studies ordered today    Referral Orders     Amb Ref to Medical Weight Management     Amb Referral to Bariatric Surgery   Interventional management options: Planned, scheduled, and/or pending:   Diagnostic Caudal ESI #1 under fluoroscopy with sedation   Considering:   Diagnostic bilateral lumbar facet block Possible bilateral lumbar facet RFA Diagnostic L5-S1 LESI Diagnostic bilateral L3transforaminal ESI Diagnostic bilateral L4transforaminal ESI Diagnostic bilateral L5 transforaminal ESI Diagnostic right-sided sacroiliac joint block Possible right-sided sacroiliac joint RFA Diagnostic bilateral lumbar facet block Possible bilateral lumbar facet  RFA Diagnostic right-sided intra-articular hip joint injection Diagnostic right-sided femoral nerve +Obturatornerve  block Possible right-sided femoral nerve + obturator RFA   Palliative PRN treatment(s):   Diagnostic bilateral lumbar facet blockunder fluoroscopic guidance and IV sedation    Provider-requested follow-up: Return for Procedure (w/ sedation): (ML) Caudal EIS #1.  Future Appointments  Date Time Provider [place]  03/16/2018  9:45 AM Milinda Pointer, MD [facility] None   Primary Care Physician: Tracie Harrier, MD Location: [facility] Outpatient Pain Management Facility Note by: Gaspar Cola, MD Date: 03/15/2018; Time: 12:09 PM

## 2018-03-15 ENCOUNTER — Ambulatory Visit: Payer: PPO | Attending: Pain Medicine | Admitting: Pain Medicine

## 2018-03-15 ENCOUNTER — Encounter: Payer: Self-pay | Admitting: Pain Medicine

## 2018-03-15 ENCOUNTER — Other Ambulatory Visit: Payer: Self-pay

## 2018-03-15 VITALS — BP 113/68 | HR 87 | Temp 97.7°F | Ht 63.0 in | Wt 218.0 lb

## 2018-03-15 DIAGNOSIS — Z9889 Other specified postprocedural states: Secondary | ICD-10-CM | POA: Insufficient documentation

## 2018-03-15 DIAGNOSIS — M79604 Pain in right leg: Secondary | ICD-10-CM | POA: Insufficient documentation

## 2018-03-15 DIAGNOSIS — Z6841 Body Mass Index (BMI) 40.0 and over, adult: Secondary | ICD-10-CM | POA: Diagnosis not present

## 2018-03-15 DIAGNOSIS — M8448XS Pathological fracture, other site, sequela: Secondary | ICD-10-CM

## 2018-03-15 DIAGNOSIS — G8929 Other chronic pain: Secondary | ICD-10-CM | POA: Diagnosis not present

## 2018-03-15 DIAGNOSIS — K5730 Diverticulosis of large intestine without perforation or abscess without bleeding: Secondary | ICD-10-CM | POA: Insufficient documentation

## 2018-03-15 DIAGNOSIS — M1611 Unilateral primary osteoarthritis, right hip: Secondary | ICD-10-CM | POA: Diagnosis not present

## 2018-03-15 DIAGNOSIS — Z79891 Long term (current) use of opiate analgesic: Secondary | ICD-10-CM | POA: Diagnosis not present

## 2018-03-15 DIAGNOSIS — M48061 Spinal stenosis, lumbar region without neurogenic claudication: Secondary | ICD-10-CM | POA: Insufficient documentation

## 2018-03-15 DIAGNOSIS — M79605 Pain in left leg: Secondary | ICD-10-CM | POA: Insufficient documentation

## 2018-03-15 DIAGNOSIS — I110 Hypertensive heart disease with heart failure: Secondary | ICD-10-CM | POA: Insufficient documentation

## 2018-03-15 DIAGNOSIS — I5023 Acute on chronic systolic (congestive) heart failure: Secondary | ICD-10-CM | POA: Insufficient documentation

## 2018-03-15 DIAGNOSIS — Z86711 Personal history of pulmonary embolism: Secondary | ICD-10-CM | POA: Diagnosis not present

## 2018-03-15 DIAGNOSIS — Z8249 Family history of ischemic heart disease and other diseases of the circulatory system: Secondary | ICD-10-CM | POA: Diagnosis not present

## 2018-03-15 DIAGNOSIS — E1142 Type 2 diabetes mellitus with diabetic polyneuropathy: Secondary | ICD-10-CM | POA: Insufficient documentation

## 2018-03-15 DIAGNOSIS — M545 Low back pain: Secondary | ICD-10-CM

## 2018-03-15 DIAGNOSIS — M47816 Spondylosis without myelopathy or radiculopathy, lumbar region: Secondary | ICD-10-CM

## 2018-03-15 DIAGNOSIS — M153 Secondary multiple arthritis: Secondary | ICD-10-CM

## 2018-03-15 DIAGNOSIS — M8448XA Pathological fracture, other site, initial encounter for fracture: Secondary | ICD-10-CM | POA: Insufficient documentation

## 2018-03-15 DIAGNOSIS — M5136 Other intervertebral disc degeneration, lumbar region: Secondary | ICD-10-CM | POA: Insufficient documentation

## 2018-03-15 DIAGNOSIS — M533 Sacrococcygeal disorders, not elsewhere classified: Secondary | ICD-10-CM | POA: Diagnosis not present

## 2018-03-15 DIAGNOSIS — E871 Hypo-osmolality and hyponatremia: Secondary | ICD-10-CM | POA: Diagnosis not present

## 2018-03-15 DIAGNOSIS — Z7982 Long term (current) use of aspirin: Secondary | ICD-10-CM | POA: Diagnosis not present

## 2018-03-15 DIAGNOSIS — Z79899 Other long term (current) drug therapy: Secondary | ICD-10-CM | POA: Insufficient documentation

## 2018-03-15 DIAGNOSIS — Z794 Long term (current) use of insulin: Secondary | ICD-10-CM | POA: Diagnosis not present

## 2018-03-15 DIAGNOSIS — G894 Chronic pain syndrome: Secondary | ICD-10-CM | POA: Insufficient documentation

## 2018-03-15 NOTE — Patient Instructions (Addendum)
____________________________________________________________________________________________  Pain Scale  Introduction: The pain score used by this practice is the Verbal Numerical Rating Scale (VNRS-11). This is an 11-point scale. It is for adults and children 10 years or older. There are significant differences in how the pain score is reported, used, and applied. Forget everything you learned in the past and learn this scoring system.  General Information: The scale should reflect your current level of pain. Unless you are specifically asked for the level of your worst pain, or your average pain. If you are asked for one of these two, then it should be understood that it is over the past 24 hours.  Basic Activities of Daily Living (ADL): Personal hygiene, dressing, eating, transferring, and using restroom.  Instructions: Most patients tend to report their level of pain as a combination of two factors, their physical pain and their psychosocial pain. This last one is also known as "suffering" and it is reflection of how physical pain affects you socially and psychologically. From now on, report them separately. From this point on, when asked to report your pain level, report only your physical pain. Use the following table for reference.  Pain Clinic Pain Levels (0-5/10)  Pain Level Score  Description  No Pain 0   Mild pain 1 Nagging, annoying, but does not interfere with basic activities of daily living (ADL). Patients are able to eat, bathe, get dressed, toileting (being able to get on and off the toilet and perform personal hygiene functions), transfer (move in and out of bed or a chair without assistance), and maintain continence (able to control bladder and bowel functions). Blood pressure and heart rate are unaffected. A normal heart rate for a healthy adult ranges from 60 to 100 bpm (beats per minute).   Mild to moderate pain 2 Noticeable and distracting. Impossible to hide from other  people. More frequent flare-ups. Still possible to adapt and function close to normal. It can be very annoying and may have occasional stronger flare-ups. With discipline, patients may get used to it and adapt.   Moderate pain 3 Interferes significantly with activities of daily living (ADL). It becomes difficult to feed, bathe, get dressed, get on and off the toilet or to perform personal hygiene functions. Difficult to get in and out of bed or a chair without assistance. Very distracting. With effort, it can be ignored when deeply involved in activities.   Moderately severe pain 4 Impossible to ignore for more than a few minutes. With effort, patients may still be able to manage work or participate in some social activities. Very difficult to concentrate. Signs of autonomic nervous system discharge are evident: dilated pupils (mydriasis); mild sweating (diaphoresis); sleep interference. Heart rate becomes elevated (>115 bpm). Diastolic blood pressure (lower number) rises above 100 mmHg. Patients find relief in laying down and not moving.   Severe pain 5 Intense and extremely unpleasant. Associated with frowning face and frequent crying. Pain overwhelms the senses.  Ability to do any activity or maintain social relationships becomes significantly limited. Conversation becomes difficult. Pacing back and forth is common, as getting into a comfortable position is nearly impossible. Pain wakes you up from deep sleep. Physical signs will be obvious: pupillary dilation; increased sweating; goosebumps; brisk reflexes; cold, clammy hands and feet; nausea, vomiting or dry heaves; loss of appetite; significant sleep disturbance with inability to fall asleep or to remain asleep. When persistent, significant weight loss is observed due to the complete loss of appetite and sleep deprivation.  Blood   pressure and heart rate becomes significantly elevated. Caution: If elevated blood pressure triggers a pounding headache  associated with blurred vision, then the patient should immediately seek attention at an urgent or emergency care unit, as these may be signs of an impending stroke.    Emergency Department Pain Levels (6-10/10)  Emergency Room Pain 6 Severely limiting. Requires emergency care and should not be seen or managed at an outpatient pain management facility. Communication becomes difficult and requires great effort. Assistance to reach the emergency department may be required. Facial flushing and profuse sweating along with potentially dangerous increases in heart rate and blood pressure will be evident.   Distressing pain 7 Self-care is very difficult. Assistance is required to transport, or use restroom. Assistance to reach the emergency department will be required. Tasks requiring coordination, such as bathing and getting dressed become very difficult.   Disabling pain 8 Self-care is no longer possible. At this level, pain is disabling. The individual is unable to do even the most "basic" activities such as walking, eating, bathing, dressing, transferring to a bed, or toileting. Fine motor skills are lost. It is difficult to think clearly.   Incapacitating pain 9 Pain becomes incapacitating. Thought processing is no longer possible. Difficult to remember your own name. Control of movement and coordination are lost.   The worst pain imaginable 10 At this level, most patients pass out from pain. When this level is reached, collapse of the autonomic nervous system occurs, leading to a sudden drop in blood pressure and heart rate. This in turn results in a temporary and dramatic drop in blood flow to the brain, leading to a loss of consciousness. Fainting is one of the body's self defense mechanisms. Passing out puts the brain in a calmed state and causes it to shut down for a while, in order to begin the healing process.    Summary: 1. Refer to this scale when providing [place] with your pain level. 2. Be  accurate and careful when reporting your pain level. This will help with your care. 3. Over-reporting your pain level will lead to loss of credibility. 4. Even a level of 1/10 means that there is pain and will be treated at our facility. 5. High, inaccurate reporting will be documented as "Symptom Exaggeration", leading to loss of credibility and suspicions of possible secondary gains such as obtaining more narcotics, or wanting to appear disabled, for fraudulent reasons. 6. Only pain levels of 5 or below will be seen at our facility. 7. Pain levels of 6 and above will be sent to the Emergency Department and the appointment cancelled. ____________________________________________________________________________________________   ____________________________________________________________________________________________  Weight Management Required  URGENT: Your weight has been found to be adversely affecting your health.  Dear [name]:  Your current There is no height or weight on file to calculate BMI.. Estimated body mass index is 38.97 kg/m as calculated from the following:   Height as of 02/28/18: 5\' 3"  (1.6 m).   Weight as of 02/28/18: 220 lb (99.8 kg).  Your last four (4) weight and BMI calculations are as follows: Wt Readings from Last 4 Encounters:  02/28/18 220 lb (99.8 kg)  02/13/18 218 lb (98.9 kg)  01/26/18 218 lb (98.9 kg)  01/18/18 218 lb (98.9 kg)   BMI Readings from Last 4 Encounters:  02/28/18 38.97 kg/m  02/13/18 38.62 kg/m  01/26/18 38.62 kg/m  01/18/18 38.62 kg/m    Calculations estimate your ideal body weight to be: Ideal body weight: 52.4  kg (115 lb 8.3 oz) Adjusted ideal body weight: 71 kg (156 lb 8.2 oz)  Please use the table below to identify your weight category and associated incidence of chronic pain, secondary to your weight.  BMI interpretation table: BMI level Category Associated incidence of chronic pain  <18 kg/m2 Underweight    18.5-24.9 kg/m2 Ideal body weight   25-29.9 kg/m2 Overweight  20%  30-34.9 kg/m2 Obese (Class I)  68%  35-39.9 kg/m2 Severe obesity (Class II)  136%  >40 kg/m2 Extreme obesity (Class III)  254%   In addition: You will be considered "Morbidly Obese", if your BMI is above 30 and you have one or more of the following conditions that are directly associated with obesity: 1. Diabetes 2. Asthma 3. High blood pressure 4. Acid reflux  5. Sleep apnea 6. Low back pain (Lumbar Facet Syndrome) 7. Hip pain (Osteoarthritis of hip) 8. Knee pain (Osteoarthritis of knee) 9. Certain types of cancer  Recommendation: At this point it is urgent that you take a step back and concentrate in loosing weight. Because most chronic pain patients do have difficulty exercising secondary to their pain, you must rely on proper nutrition and dieting in order to lose the weight. If your BMI is above 40, you should seriously consider bariatric surgery. A realistic goal is to lose 10% of your body weight over a period of 12 months.  If over time you have unsuccessfully try to lose weight, then it is time for you to seek professional help and to enter a medically supervised weight management program.  Pain management considerations:  1. Pharmacological Problems: Be advised that the use of opioid analgesics has been associated with decreased metabolism and weight gain.  For this reason, should we see that you are unable to lose weight while taking these medications, it may become necessary for [place] to taper down and indefinitely discontinue these medicines.  2. Technical Problems: The incidence of successful interventional therapies decreases as the patient's BMI increases. It is much more difficult to accomplish a safe and effective interventional therapy on a patient with a BMI above 35. Yours is Body mass index is 38.62 kg/m.Marland Kitchen 3. Radiation Exposure Problems: The x-rays machine, used to accomplish injection therapies, will  automatically increase their x-ray output in order to capture an appropriate bone image. This means that radiation exposure increases exponentially with the patient's BMI. (The higher the BMI, the higher the radiation exposure.) Although the level of radiation used at a given time is still safe to the patient, it is not for the physician and/or assisting staff. Unfortunately, radiation exposure is accumulative. Because physicians and the staff have to do procedures and be exposed on a daily basis, this can result in health problems such as cancer and radiation burns. Radiation exposure to the staff is monitored by the radiation batches that they wear. The exposure levels are reported back to the staff on a quarterly basis. Depending on levels of exposure, physicians and staff may be obligated by law to decrease this exposure. This means that they have the right and obligation to refuse providing therapies where they may be overexposed to radiation. For this reason, physicians may decline to offer therapies such as radiofrequency ablation or implants to patients with a BMI above 40. ____________________________________________________________________________________________

## 2018-03-16 ENCOUNTER — Ambulatory Visit
Admission: RE | Admit: 2018-03-16 | Discharge: 2018-03-16 | Disposition: A | Discharge status: Home / Self Care | Payer: PPO | Source: Ambulatory Visit | Attending: Pain Medicine | Admitting: Pain Medicine

## 2018-03-16 ENCOUNTER — Encounter: Payer: Self-pay | Admitting: Pain Medicine

## 2018-03-16 ENCOUNTER — Ambulatory Visit (HOSPITAL_BASED_OUTPATIENT_CLINIC_OR_DEPARTMENT_OTHER): Payer: PPO | Admitting: Pain Medicine

## 2018-03-16 ENCOUNTER — Other Ambulatory Visit: Payer: Self-pay

## 2018-03-16 VITALS — BP 132/70 | HR 76 | Temp 96.8°F | Resp 21 | Ht 63.0 in | Wt 218.0 lb

## 2018-03-16 DIAGNOSIS — M5136 Other intervertebral disc degeneration, lumbar region: Secondary | ICD-10-CM

## 2018-03-16 DIAGNOSIS — M79605 Pain in left leg: Secondary | ICD-10-CM

## 2018-03-16 DIAGNOSIS — M5441 Lumbago with sciatica, right side: Secondary | ICD-10-CM

## 2018-03-16 DIAGNOSIS — G8929 Other chronic pain: Secondary | ICD-10-CM

## 2018-03-16 DIAGNOSIS — Z885 Allergy status to narcotic agent status: Secondary | ICD-10-CM | POA: Diagnosis not present

## 2018-03-16 DIAGNOSIS — M5410 Radiculopathy, site unspecified: Secondary | ICD-10-CM | POA: Diagnosis not present

## 2018-03-16 DIAGNOSIS — M79604 Pain in right leg: Secondary | ICD-10-CM | POA: Diagnosis not present

## 2018-03-16 DIAGNOSIS — Z7982 Long term (current) use of aspirin: Secondary | ICD-10-CM | POA: Insufficient documentation

## 2018-03-16 DIAGNOSIS — Z888 Allergy status to other drugs, medicaments and biological substances status: Secondary | ICD-10-CM | POA: Diagnosis not present

## 2018-03-16 DIAGNOSIS — M5442 Lumbago with sciatica, left side: Secondary | ICD-10-CM | POA: Insufficient documentation

## 2018-03-16 DIAGNOSIS — Z79899 Other long term (current) drug therapy: Secondary | ICD-10-CM | POA: Diagnosis not present

## 2018-03-16 DIAGNOSIS — Z91040 Latex allergy status: Secondary | ICD-10-CM

## 2018-03-16 MED ORDER — FENTANYL CITRATE (PF) 100 MCG/2ML IJ SOLN
25.0000 ug | INTRAMUSCULAR | Status: DC | PRN
  Administered 2018-03-16: 50 ug via INTRAVENOUS
  Filled 2018-03-16: qty 2

## 2018-03-16 MED ORDER — SODIUM CHLORIDE 0.9% FLUSH
2.0000 mL | Freq: Once | INTRAVENOUS | Status: AC
  Administered 2018-03-16: 2 mL

## 2018-03-16 MED ORDER — ROPIVACAINE HCL 2 MG/ML IJ SOLN
2.0000 mL | Freq: Once | INTRAMUSCULAR | Status: AC
  Administered 2018-03-16: 2 mL via EPIDURAL
  Filled 2018-03-16: qty 10

## 2018-03-16 MED ORDER — MIDAZOLAM HCL 5 MG/5ML IJ SOLN
1.0000 mg | INTRAMUSCULAR | Status: DC | PRN
  Administered 2018-03-16: 2 mg via INTRAVENOUS
  Filled 2018-03-16: qty 5

## 2018-03-16 MED ORDER — TRIAMCINOLONE ACETONIDE 40 MG/ML IJ SUSP
40.0000 mg | Freq: Once | INTRAMUSCULAR | Status: AC
  Administered 2018-03-16: 40 mg
  Filled 2018-03-16: qty 1

## 2018-03-16 MED ORDER — LACTATED RINGERS IV SOLN
1000.0000 mL | Freq: Once | INTRAVENOUS | Status: AC
  Administered 2018-03-16: 1000 mL via INTRAVENOUS

## 2018-03-16 MED ORDER — IOPAMIDOL (ISOVUE-M 200) INJECTION 41%
10.0000 mL | Freq: Once | INTRAMUSCULAR | Status: AC
  Administered 2018-03-16: 10 mL via EPIDURAL
  Filled 2018-03-16: qty 10

## 2018-03-16 MED ORDER — LIDOCAINE HCL 2 % IJ SOLN
20.0000 mL | Freq: Once | INTRAMUSCULAR | Status: AC
  Administered 2018-03-16: 400 mg
  Filled 2018-03-16: qty 40

## 2018-03-16 NOTE — Patient Instructions (Signed)

## 2018-03-16 NOTE — Progress Notes (Signed)
Patient's Name: Kara [name]  MRN: [id number]  Referring Provider: Tracie Harrier, MD  DOB: 06/20/1950  PCP: Tracie Harrier, MD  DOS: 03/16/2018  Note by: Gaspar Cola, MD  Service setting: Ambulatory outpatient  Specialty: Interventional Pain Management  Patient type: Established  Location: [name] (AMB) Pain Management Facility  Visit type: Interventional Procedure   Primary Reason for Visit: Interventional Pain Management Treatment. CC: Tailbone Pain (right)  Procedure:          Anesthesia, Analgesia, Anxiolysis:  Type: Diagnostic caudal epidural Steroid Injection #1 Region: Caudal Level: Sacrococcygeal   Laterality: Midline       Type: Moderate (Conscious) Sedation combined with Local Anesthesia Indication(s): Analgesia and Anxiety Route: Intravenous (IV) IV Access: Secured Sedation: Meaningful verbal contact was maintained at all times during the procedure  Local Anesthetic: Lidocaine 1-2%  Position: Prone   Indications: 1. DDD (degenerative disc disease), lumbar   2. 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 Chronic lower extremity pain (Secondary Area of Pain) (Bilateral)   4. Chronic radicular pain of lower extremity   5. History of allergy to latex    Pain Score: Pre-procedure: 3 /10 Post-procedure: 2 /10  Pre-op Assessment:  Kara [name] is a 67 y.o. (year old), female patient, seen today for interventional treatment. She  has a past surgical history that includes Knee surgery; Carpal tunnel release (Bilateral); Colon resection sigmoid (N/A, 11/02/2016); Colostomy (N/A, 11/02/2016); Sigmoidoscopy (N/A, 11/13/2016); PULMONARY VENOGRAPHY (N/A, 11/19/2016); Colonoscopy with propofol (N/A, 02/03/2017); Colon surgery (11/02/2016); IVC FILTER INSERTION (Right, 10/2016); Colostomy reversal (N/A, 02/24/2017); Appendectomy (N/A, 02/24/2017); Lysis of adhesion (N/A, 02/24/2017); laparoscopy (N/A, 02/24/2017); Ileo loop  diversion (N/A, 02/24/2017); Sigmoidoscopy (N/A, 05/19/2017); Ileostomy closure (N/A, 06/01/2017); IVC FILTER REMOVAL (N/A, 08/09/2017); and Sacroplasty (N/A, 11/08/2017). Kara [name] has a current medication list which includes the following prescription(s): aspirin ec, candesartan, cyclobenzaprine, gabapentin, glipizide, meclizine, multi-vitamins, and ropinirole, and the following Facility-Administered Medications: fentanyl and midazolam. Her primarily concern today is the Tailbone Pain (right)  Initial Vital Signs:  Pulse/HCG Rate: 76ECG Heart Rate: 83 Temp: 97.7 F (36.5 C) Resp: 18 BP: 116/72 SpO2: 98 %  BMI: Estimated body mass index is 38.62 kg/m as calculated from the following:   Height as of this encounter: 5\' 3"  (1.6 m).   Weight as of this encounter: 218 lb (98.9 kg).  Risk Assessment: Allergies: Reviewed. She is allergic to adhesive [tape]; morphine; other; latex; and morphine and related.  Allergy Precautions: None required Coagulopathies: Reviewed. None identified.  Blood-thinner therapy: None at this time Active Infection(s): Reviewed. None identified. Kara [name] is afebrile  Site Confirmation: Kara [name] was asked to confirm the procedure and laterality before marking the site Procedure checklist: Completed Consent: Before the procedure and under the influence of no sedative(s), amnesic(s), or anxiolytics, the patient was informed of the treatment options, risks and possible complications. To fulfill our ethical and legal obligations, as recommended by the American Medical Association's Code of Ethics, I have informed the patient of my clinical impression; the nature and purpose of the treatment or procedure; the risks, benefits, and possible complications of the intervention; the alternatives, including doing nothing; the risk(s) and benefit(s) of the alternative treatment(s) or procedure(s); and the risk(s) and benefit(s) of doing nothing. The  patient was provided information about the general risks and possible complications associated with the procedure. These may include, but are not limited to: failure to achieve desired goals, infection, bleeding, organ or nerve damage, allergic  reactions, paralysis, and death. In addition, the patient was informed of those risks and complications associated to Spine-related procedures, such as failure to decrease pain; infection (i.e.: Meningitis, epidural or intraspinal abscess); bleeding (i.e.: epidural hematoma, subarachnoid hemorrhage, or any other type of intraspinal or peri-dural bleeding); organ or nerve damage (i.e.: Any type of peripheral nerve, nerve root, or spinal cord injury) with subsequent damage to sensory, motor, and/or autonomic systems, resulting in permanent pain, numbness, and/or weakness of one or several areas of the body; allergic reactions; (i.e.: anaphylactic reaction); and/or death. Furthermore, the patient was informed of those risks and complications associated with the medications. These include, but are not limited to: allergic reactions (i.e.: anaphylactic or anaphylactoid reaction(s)); adrenal axis suppression; blood sugar elevation that in diabetics may result in ketoacidosis or comma; water retention that in patients with history of congestive heart failure may result in shortness of breath, pulmonary edema, and decompensation with resultant heart failure; weight gain; swelling or edema; medication-induced neural toxicity; particulate matter embolism and blood vessel occlusion with resultant organ, and/or nervous system infarction; and/or aseptic necrosis of one or more joints. Finally, the patient was informed that Medicine is not an exact science; therefore, there is also the possibility of unforeseen or unpredictable risks and/or possible complications that may result in a catastrophic outcome. The patient indicated having understood very clearly. We have given the patient no  guarantees and we have made no promises. Enough time was given to the patient to ask questions, all of which were answered to the patient's satisfaction. Kara [name] has indicated that she wanted to continue with the procedure. Attestation: I, the ordering provider, attest that I have discussed with the patient the benefits, risks, side-effects, alternatives, likelihood of achieving goals, and potential problems during recovery for the procedure that I have provided informed consent. Date  Time: 03/16/2018 10:09 AM  Pre-Procedure Preparation:  Monitoring: As per clinic protocol. Respiration, ETCO2, SpO2, BP, heart rate and rhythm monitor placed and checked for adequate function Safety Precautions: Patient was assessed for positional comfort and pressure points before starting the procedure. Time-out: I initiated and conducted the "Time-out" before starting the procedure, as per protocol. The patient was asked to participate by confirming the accuracy of the "Time Out" information. Verification of the correct person, site, and procedure were performed and confirmed by me, the nursing staff, and the patient. "Time-out" conducted as per Joint Commission's Universal Protocol (UP.01.01.01). Time: 1139  Description of Procedure:          Target Area: Caudal Epidural Canal. Approach: Midline approach. Area Prepped: Entire Posterior Sacrococcygeal Region Prepping solution: ChloraPrep (2% chlorhexidine gluconate and 70% isopropyl alcohol) Safety Precautions: Aspiration looking for blood return was conducted prior to all injections. At no point did we inject any substances, as a needle was being advanced. No attempts were made at seeking any paresthesias. Safe injection practices and needle disposal techniques used. Medications properly checked for expiration dates. SDV (single dose vial) medications used. Description of the Procedure: Protocol guidelines were followed. The patient was placed in  position over the fluoroscopy table. The target area was identified and the area prepped in the usual manner. Skin & deeper tissues infiltrated with local anesthetic. Appropriate amount of time allowed to pass for local anesthetics to take effect. The procedure needles were then advanced to the target area. Proper needle placement secured. Negative aspiration confirmed. Solution injected in intermittent fashion, asking for systemic symptoms every 0.5cc of injectate. The needles were then removed and the  area cleansed, making sure to leave some of the prepping solution back to take advantage of its long term bactericidal properties. Vitals:   03/16/18 1145 03/16/18 1155 03/16/18 1205 03/16/18 1219  BP: 123/69 (!) 136/55 118/70 132/70  Pulse:      Resp: 19 (!) 21 (!) 27 (!) 21  Temp:  (!) 97.5 F (36.4 C)  (!) 96.8 F (36 C)  TempSrc:  Temporal  Temporal  SpO2: 95% 99% 93% 97%  Weight:      Height:        Start Time: 1139 hrs. End Time: 1145 hrs. Materials:  Needle(s) Type: Epidural needle Gauge: 17G Length: 3.5-in Medication(s): Please see orders for medications and dosing details.  Imaging Guidance (Spinal):          Type of Imaging Technique: Fluoroscopy Guidance (Spinal) Indication(s): Assistance in needle guidance and placement for procedures requiring needle placement in or near specific anatomical locations not easily accessible without such assistance. Exposure Time: Please see nurses notes. Contrast: Before injecting any contrast, we confirmed that the patient did not have an allergy to iodine, shellfish, or radiological contrast. Once satisfactory needle placement was completed at the desired level, radiological contrast was injected. Contrast injected under live fluoroscopy. No contrast complications. See chart for type and volume of contrast used. Fluoroscopic Guidance: I was personally present during the use of fluoroscopy. "Tunnel Vision Technique" used to obtain the best  possible view of the target area. Parallax error corrected before commencing the procedure. "Direction-depth-direction" technique used to introduce the needle under continuous pulsed fluoroscopy. Once target was reached, antero-posterior, oblique, and lateral fluoroscopic projection used confirm needle placement in all planes. Images permanently stored in EMR. Interpretation: I personally interpreted the imaging intraoperatively. Adequate needle placement confirmed in multiple planes. Appropriate spread of contrast into desired area was observed. No evidence of afferent or efferent intravascular uptake. No intrathecal or subarachnoid spread observed. Permanent images saved into the patient's record.  Antibiotic Prophylaxis:   Anti-infectives (From admission, onward)   None     Indication(s): None identified  Post-operative Assessment:  Post-procedure Vital Signs:  Pulse/HCG Rate: 7680 Temp: (!) 96.8 F (36 C) Resp: (!) 21 BP: 132/70 SpO2: 97 %  EBL: None  Complications: No immediate post-treatment complications observed by team, or reported by patient.  Note: The patient tolerated the entire procedure well. A repeat set of vitals were taken after the procedure and the patient was kept under observation following institutional policy, for this type of procedure. Post-procedural neurological assessment was performed, showing return to baseline, prior to discharge. The patient was provided with post-procedure discharge instructions, including a section on how to identify potential problems. Should any problems arise concerning this procedure, the patient was given instructions to immediately contact us, at any time, without hesitation. In any case, we plan to contact the patient by telephone for a follow-up status report regarding this interventional procedure.  Comments:  No additional relevant information.  Plan of Care    Imaging Orders     DG C-Arm 1-60 Min-No Report  Procedure  Orders     Caudal Epidural Injection  Medications ordered for procedure: Meds ordered this encounter  Medications  . iopamidol (ISOVUE-M) 41 % intrathecal injection 10 mL    Must be Myelogram-compatible. If not available, you may substitute with a water-soluble, non-ionic, hypoallergenic, myelogram-compatible radiological contrast medium.  Marland Kitchen lidocaine (XYLOCAINE) 2 % (with pres) injection 400 mg  . midazolam (VERSED) 5 MG/5ML injection 1-2 mg    Make  sure Flumazenil is available in the pyxis when using this medication. If oversedation occurs, administer 0.2 mg IV over 15 sec. If after 45 sec no response, administer 0.2 mg again over 1 min; may repeat at 1 min intervals; not to exceed 4 doses (1 mg)  . fentaNYL (SUBLIMAZE) injection 25-50 mcg    Make sure Narcan is available in the pyxis when using this medication. In the event of respiratory depression (RR< 8/min): Titrate NARCAN (naloxone) in increments of 0.1 to 0.2 mg IV at 2-3 minute intervals, until desired degree of reversal.  . lactated ringers infusion 1,000 mL  . sodium chloride flush (NS) 0.9 % injection 2 mL  . ropivacaine (PF) 2 mg/mL (0.2%) (NAROPIN) injection 2 mL  . triamcinolone acetonide (KENALOG-40) injection 40 mg   Medications administered: We administered iopamidol, lidocaine, midazolam, fentaNYL, lactated ringers, sodium chloride flush, ropivacaine (PF) 2 mg/mL (0.2%), and triamcinolone acetonide.  See the medical record for exact dosing, route, and time of administration.  Disposition: Discharge home  Discharge Date & Time: 03/16/2018; 1230 hrs.   Physician-requested Follow-up: Return for post-procedure eval (2 wks), w/ Dr. Dossie Arbour.  Future Appointments  Date Time Provider [place]  03/29/2018  2:00 PM Milinda Pointer, MD [facility] None   Primary Care Physician: Tracie Harrier, MD Location: [facility] Note by: Gaspar Cola, MD Date: 03/16/2018; Time: 1:16  PM  Disclaimer:  Medicine is not an exact science. The only guarantee in medicine is that nothing is guaranteed. It is important to note that the decision to proceed with this intervention was based on the information collected from the patient. The Data and conclusions were drawn from the patient's questionnaire, the interview, and the physical examination. Because the information was provided in large part by the patient, it cannot be guaranteed that it has not been purposely or unconsciously manipulated. Every effort has been made to obtain as much relevant data as possible for this evaluation. It is important to note that the conclusions that lead to this procedure are derived in large part from the available data. Always take into account that the treatment will also be dependent on availability of resources and existing treatment guidelines, considered by other Pain Management Practitioners as being common knowledge and practice, at the time of the intervention. For Medico-Legal purposes, it is also important to point out that variation in procedural techniques and pharmacological choices are the acceptable norm. The indications, contraindications, technique, and results of the above procedure should only be interpreted and judged by a Board-Certified Interventional Pain Specialist with extensive familiarity and expertise in the same exact procedure and technique.

## 2018-03-17 ENCOUNTER — Telehealth: Payer: Self-pay

## 2018-03-17 NOTE — Telephone Encounter (Signed)
Post procedure phone call.  Patient states she is doing well.  

## 2018-03-28 NOTE — Progress Notes (Signed)
Patient's Name: Kara [name]  MRN: [id number]  Referring Provider: Tracie Harrier, MD  DOB: [birth year]  PCP: Tracie Harrier, MD  DOS: 03/29/2018  Note by: Gaspar Cola, MD  Service setting: Ambulatory outpatient  Specialty: Interventional Pain Management  Location: [name] (AMB) Pain Management Facility    Patient type: Established   Primary Reason(s) for Visit: Encounter for post-procedure evaluation of chronic illness with mild to moderate exacerbation CC: Back Pain  HPI  Kara [name] is a 67 y.o. year old, female patient, who comes today for a post-procedure evaluation. She has Diabetes mellitus type 2 in obese ([place]); Type 2 diabetes mellitus without complication, without long-term current use of insulin ([place]); BP (high blood pressure); Nocturnal leg cramps; Cough; Chronic hip pain (Left); Acute non-recurrent maxillary sinusitis; Diverticulitis large intestine; Diverticulitis; DDD (degenerative disc disease), lumbar; Benign essential hypertension; Edema of both legs; Morbidly obese ([place]); Acute on chronic systolic (congestive) heart failure ([place]); HCAP (healthcare-associated pneumonia); Encounter for removal of staples; Other pulmonary embolism without acute cor pulmonale ([place]); History of colostomy reversal; Ileostomy in place [facility]); Positive result for methicillin resistant Staphylococcus aureus (MRSA) screening; H/O ileostomy; Diverticulosis of colon; History of pulmonary embolism; S/P IVC filter; Chronic low back pain (Primary Area of Pain) (Bilateral) w/ sciatica (Bilateral); Chronic sacroiliac joint pain (Right); Chronic pain syndrome; Long term current use of opiate analgesic; Pharmacologic therapy; Disorder of skeletal system; Problems influencing health status; Hyponatremia; Hypocalcemia; 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-S1 anterolisthesis.; Lumbar foraminal stenosis (L3-4 and L4-5) (Bilateral); Lumbosacral L5-S1 subarticular lateral recess stenosis (Bilateral); Lumbar  facet arthropathy (L3-4, L4-5, and L5-S1) (Bilateral); Lumbar facet syndrome (Bilateral); 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); Lumbar pars defect (L3 and L4) (Bilateral); Diabetic peripheral neuropathy ([place]); Chronic lower extremity pain (Secondary Area of Pain) (Bilateral); Chronic radicular pain of lower extremity; Osteoarthritis of hip (Right); History of allergy to latex; Chronic low back pain (Primary Area of Pain) (Bilateral); Spondylosis without myelopathy or radiculopathy, lumbosacral region; Other specified dorsopathies, sacral and sacrococcygeal region; Morbid obesity with BMI of 45.0-49.9, adult ([place]); Secondary osteoarthritis of multiple sites; and Sacral insufficiency fracture on their problem list. Her primarily concern today is the Back Pain  Pain Assessment: Location: Right, Lower Buttocks Radiating: pain radiaties on the right side Onset: More than a month ago Duration: Chronic pain Quality: Burning, Constant Severity: 3 /10 (subjective, self-reported pain score)  Note: Reported level is compatible with observation.                         When using our objective Pain Scale, levels between 6 and 10/10 are said to belong in an emergency room, as it progressively worsens from a 6/10, described as severely limiting, requiring emergency care not usually available at an outpatient pain management facility. At a 6/10 level, communication becomes difficult and requires great effort. Assistance to reach the emergency department may be required. Facial flushing and profuse sweating along with potentially dangerous increases in heart rate and blood pressure will be evident. Effect on ADL: limits my daily activities Timing: Constant Modifying factors: lay down or sit down BP: 105/67  HR: 89  Kara [name] comes in today for post-procedure evaluation.  The patient has obtained significant benefit from the caudal epidural steroid injection where she indicates that  her pain has gone down in the region approximately 80%.  She indicates that this point, she only has pain when she first wakes up in the morning, but after a little  while,it will go away.  Further details on both, my assessment(s), as well as the proposed treatment plan, please see below.  Post-Procedure Assessment  03/16/2018 Procedure: Diagnostic (Midline) caudal epidural steroid injection #1 under fluoroscopic guidance and IV sedation Pre-procedure pain score:  3/10 Post-procedure pain score: 2/10 (< 50% relief) Influential Factors: BMI: 39.33 kg/m Intra-procedural challenges: None observed.         Assessment challenges: None detected.              Reported side-effects: None.        Post-procedural adverse reactions or complications: None reported         Sedation: Sedation provided. When no sedatives are used, the analgesic levels obtained are directly associated to the effectiveness of the local anesthetics. However, when sedation is provided, the level of analgesia obtained during the initial 1 hour following the intervention, is believed to be the result of a combination of factors. These factors may include, but are not limited to: 1. The effectiveness of the local anesthetics used. 2. The effects of the analgesic(s) and/or anxiolytic(s) used. 3. The degree of discomfort experienced by the patient at the time of the procedure. 4. The patients ability and reliability in recalling and recording the events. 5. The presence and influence of possible secondary gains and/or psychosocial factors. Reported result: Relief experienced during the 1st hour after the procedure: 100 % (Ultra-Short Term Relief) [name] has indicated area to have been numb during this time. Interpretative annotation: Clinically appropriate result. Analgesia during this period is likely to be Local Anesthetic and/or IV Sedative (Analgesic/Anxiolytic) related.          Effects of local anesthetic: The  analgesic effects attained during this period are directly associated to the localized infiltration of local anesthetics and therefore cary significant diagnostic value as to the etiological location, or anatomical origin, of the pain. Expected duration of relief is directly dependent on the pharmacodynamics of the local anesthetic used. Long-acting (4-6 hours) anesthetics used.  Reported result: Relief during the next 4 to 6 hour after the procedure: 100 %(for 3 days) (Short-Term Relief)            Interpretative annotation: Clinically appropriate result. Analgesia during this period is likely to be Local Anesthetic-related.          Long-term benefit: Defined as the period of time past the expected duration of local anesthetics (1 hour for short-acting and 4-6 hours for long-acting). With the possible exception of prolonged sympathetic blockade from the local anesthetics, benefits during this period are typically attributed to, or associated with, other factors such as analgesic sensory neuropraxia, antiinflammatory effects, or beneficial biochemical changes provided by agents other than the local anesthetics.  Reported result: Extended relief following procedure: 80 % (Long-Term Relief)            Interpretative annotation: Clinically possible results. Good relief. No permanent benefit expected. Inflammation plays a part in the etiology to the pain.          Current benefits: Defined as reported results that persistent at this point in time.   Analgesia: 80 % Ms. Watling reports improvement of axial symptoms.  The patient indicates that the caudal epidural steroid injection provided her with significant benefit in the tailbone region where she was having back pain. Function: Ms. Norgard reports improvement in function ROM: Ms. Dolores reports improvement in ROM Interpretative annotation: Ongoing benefit. Therapeutic success. Effective therapeutic approach. Benefit could be  steroid-related.  Interpretation: Results would  suggest a successful diagnostic intervention.                  Plan:  Set up procedure as a PRN palliative treatment option for this patient.                Laboratory Chemistry  Inflammation Markers (CRP: Acute Phase) (ESR: Chronic Phase) Lab Results  Component Value Date   CRP 5.3 (H) 11/02/2017   ESRSEDRATE 12 11/02/2017                         Rheumatology Markers No results found.  Renal Markers Lab Results  Component Value Date   BUN 19 10/03/2017   CREATININE 0.63 10/03/2017   GFRAA >60 10/03/2017   GFRNONAA >60 10/03/2017                             Hepatic Markers Lab Results  Component Value Date   AST 40 04/21/2017   ALT 53 04/21/2017   ALBUMIN 3.8 04/21/2017                        Neuropathy Markers Lab Results  Component Value Date   VITAMINB12 367 11/02/2017   HGBA1C 7.2 (H) 02/17/2017   HIV Non Reactive 11/18/2016                        Hematology Parameters Lab Results  Component Value Date   PLT 200 10/03/2017   HGB 16.5 (H) 10/03/2017   HCT 49.7 (H) 10/03/2017                        CV Markers Lab Results  Component Value Date   BNP 60.0 11/17/2016   CKTOTAL 71 10/02/2011   CKMB < 0.5 (L) 10/02/2011   TROPONINI <0.03 11/17/2016                         Note: Lab results reviewed.  Recent Imaging Results   Results for orders placed in visit on 03/16/18  DG C-Arm 1-60 Min-No Report   Narrative Fluoroscopy was utilized by the requesting physician.  No radiographic  interpretation.    Interpretation Report: Fluoroscopy was used during the procedure to assist with needle guidance. The images were interpreted intraoperatively by the requesting physician.  Meds   Current Outpatient Medications:  .  aspirin EC 81 MG tablet, Take 81 mg by mouth daily., Disp: , Rfl:  .  candesartan (ATACAND) 16 MG tablet, Take 8 mg by mouth daily. , Disp: , Rfl:  .  cyclobenzaprine (FLEXERIL) 10 MG  tablet, Take 10 mg by mouth at bedtime. , Disp: , Rfl:  .  gabapentin (NEURONTIN) 100 MG capsule, Take 200 mg by mouth 3 (three) times daily. , Disp: , Rfl:  .  glipiZIDE (GLUCOTROL XL) 5 MG 24 hr tablet, Take 5 mg by mouth daily with breakfast. , Disp: , Rfl:  .  meclizine (ANTIVERT) 25 MG tablet, , Disp: , Rfl:  .  Multiple Vitamin (MULTI-VITAMINS) TABS, Take 1 tablet by mouth daily., Disp: , Rfl:  .  rOPINIRole (REQUIP) 0.5 MG tablet, Take 0.5 mg by mouth at bedtime., Disp: , Rfl:   ROS  Constitutional: Denies any fever or chills Gastrointestinal: No reported hemesis, hematochezia, vomiting, or acute GI distress Musculoskeletal: Denies any  acute onset joint swelling, redness, loss of ROM, or weakness Neurological: No reported episodes of acute onset apraxia, aphasia, dysarthria, agnosia, amnesia, paralysis, loss of coordination, or loss of consciousness  Allergies  Ms. Strohmeyer is allergic to adhesive [tape]; morphine; other; latex; and morphine and related.  PFSH  Drug: Ms. Mckamey  reports that she does not use drugs. Alcohol:  reports that she does not drink alcohol. Tobacco:  reports that she has never smoked. She has never used smokeless tobacco. 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 Surgical: Ms. Laguna  has a past surgical history that includes Knee surgery; Carpal tunnel release (Bilateral); Colon resection sigmoid (N/A, 11/02/2016); Colostomy (N/A, 11/02/2016); Sigmoidoscopy (N/A, 11/13/2016); PULMONARY VENOGRAPHY (N/A, 11/19/2016); Colonoscopy with propofol (N/A, 02/03/2017); Colon surgery (11/02/2016); IVC FILTER INSERTION (Right, 10/2016); Colostomy reversal (N/A, 02/24/2017); Appendectomy (N/A, 02/24/2017); Lysis of adhesion (N/A, 02/24/2017); laparoscopy (N/A,  02/24/2017); Ileo loop diversion (N/A, 02/24/2017); Sigmoidoscopy (N/A, 05/19/2017); Ileostomy closure (N/A, 06/01/2017); IVC FILTER REMOVAL (N/A, 08/09/2017); and Sacroplasty (N/A, 11/08/2017). Family: family history includes Breast cancer in her cousin and mother; Cancer in her mother; Diabetes in her mother and sister; Heart disease in her father, mother, and sister.  Constitutional Exam  General appearance: Well nourished, well developed, and well hydrated. In no apparent acute distress Vitals:   03/29/18 0900  BP: 105/67  Pulse: 89  Temp: 97.6 F (36.4 C)  SpO2: 97%  Weight: 222 lb (100.7 kg)  Height: '5\' 3"'  (1.6 m)   BMI Assessment: Estimated body mass index is 39.33 kg/m as calculated from the following:   Height as of this encounter: '5\' 3"'  (1.6 m).   Weight as of this encounter: 222 lb (100.7 kg).  BMI interpretation table: BMI level Category Range association with higher incidence of chronic pain  <18 kg/m2 Underweight   18.5-24.9 kg/m2 Ideal body weight   25-29.9 kg/m2 Overweight Increased incidence by 20%  30-34.9 kg/m2 Obese (Class I) Increased incidence by 68%  35-39.9 kg/m2 Severe obesity (Class II) Increased incidence by 136%  >40 kg/m2 Extreme obesity (Class III) Increased incidence by 254%   Patient's current BMI Ideal Body weight  Body mass index is 39.33 kg/m. Ideal body weight: 52.4 kg (115 lb 8.3 oz) Adjusted ideal body weight: 71.7 kg (158 lb 1.8 oz)   BMI Readings from Last 4 Encounters:  03/29/18 39.33 kg/m  03/16/18 38.62 kg/m  03/15/18 38.62 kg/m  02/28/18 38.97 kg/m   Wt Readings from Last 4 Encounters:  03/29/18 222 lb (100.7 kg)  03/16/18 218 lb (98.9 kg)  03/15/18 218 lb (98.9 kg)  02/28/18 220 lb (99.8 kg)  Psych/Mental status: Alert, oriented x 3 (person, place, & time)       Eyes: PERLA Respiratory: No evidence of acute respiratory distress  Cervical Spine Area Exam  Skin & Axial Inspection: No masses, redness, edema, swelling, or  associated skin lesions Alignment: Symmetrical Functional ROM: Unrestricted ROM      Stability: No instability detected Muscle Tone/Strength: Functionally intact. No obvious neuro-muscular anomalies detected. Sensory (Neurological): Unimpaired Palpation: No palpable anomalies              Upper Extremity (UE) Exam    Side: Right upper extremity  Side: Left upper extremity  Skin & Extremity Inspection: Skin color, temperature, and hair growth are WNL. No peripheral edema or cyanosis. No masses, redness, swelling, asymmetry,  or associated skin lesions. No contractures.  Skin & Extremity Inspection: Skin color, temperature, and hair growth are WNL. No peripheral edema or cyanosis. No masses, redness, swelling, asymmetry, or associated skin lesions. No contractures.  Functional ROM: Unrestricted ROM          Functional ROM: Unrestricted ROM          Muscle Tone/Strength: Functionally intact. No obvious neuro-muscular anomalies detected.  Muscle Tone/Strength: Functionally intact. No obvious neuro-muscular anomalies detected.  Sensory (Neurological): Unimpaired          Sensory (Neurological): Unimpaired          Palpation: No palpable anomalies              Palpation: No palpable anomalies              Provocative Test(s):  Phalen's test: deferred Tinel's test: deferred Apley's scratch test (touch opposite shoulder):  Action 1 (Across chest): deferred Action 2 (Overhead): deferred Action 3 (LB reach): deferred   Provocative Test(s):  Phalen's test: deferred Tinel's test: deferred Apley's scratch test (touch opposite shoulder):  Action 1 (Across chest): deferred Action 2 (Overhead): deferred Action 3 (LB reach): deferred    Thoracic Spine Area Exam  Skin & Axial Inspection: No masses, redness, or swelling Alignment: Symmetrical Functional ROM: Unrestricted ROM Stability: No instability detected Muscle Tone/Strength: Functionally intact. No obvious neuro-muscular anomalies  detected. Sensory (Neurological): Unimpaired Muscle strength & Tone: No palpable anomalies  Lumbar Spine Area Exam  Skin & Axial Inspection: No masses, redness, or swelling Alignment: Symmetrical Functional ROM: Unrestricted ROM       Stability: No instability detected Muscle Tone/Strength: Functionally intact. No obvious neuro-muscular anomalies detected. Sensory (Neurological): Unimpaired Palpation: No palpable anomalies       Provocative Tests: Hyperextension/rotation test: deferred today       Lumbar quadrant test (Kemp's test): deferred today       Lateral bending test: deferred today       Patrick's Maneuver: deferred today                   FABER test: deferred today                   S-I anterior distraction/compression test: deferred today         S-I lateral compression test: deferred today         S-I Thigh-thrust test: deferred today         S-I Gaenslen's test: deferred today          Gait & Posture Assessment  Ambulation: Unassisted Gait: Relatively normal for age and body habitus Posture: WNL   Lower Extremity Exam    Side: Right lower extremity  Side: Left lower extremity  Stability: No instability observed          Stability: No instability observed          Skin & Extremity Inspection: Skin color, temperature, and hair growth are WNL. No peripheral edema or cyanosis. No masses, redness, swelling, asymmetry, or associated skin lesions. No contractures.  Skin & Extremity Inspection: Skin color, temperature, and hair growth are WNL. No peripheral edema or cyanosis. No masses, redness, swelling, asymmetry, or associated skin lesions. No contractures.  Functional ROM: Unrestricted ROM                  Functional ROM: Unrestricted ROM                  Muscle Tone/Strength:  Functionally intact. No obvious neuro-muscular anomalies detected.  Muscle Tone/Strength: Functionally intact. No obvious neuro-muscular anomalies detected.  Sensory (Neurological): Unimpaired   Sensory (Neurological): Unimpaired  Palpation: No palpable anomalies  Palpation: No palpable anomalies   Assessment  Primary Diagnosis & Pertinent Problem List: The primary encounter diagnosis was Chronic low back pain (Primary Area of Pain) (Bilateral). Diagnoses of Chronic lower extremity pain (Secondary Area of Pain) (Bilateral), Sacral insufficiency fracture, and Other specified dorsopathies, sacral and sacrococcygeal region were also pertinent to this visit.  Status Diagnosis  Controlled Controlled Controlled 1. Chronic low back pain (Primary Area of Pain) (Bilateral)   2. Chronic lower extremity pain (Secondary Area of Pain) (Bilateral)   3. Sacral insufficiency fracture   4. Other specified dorsopathies, sacral and sacrococcygeal region     Problems updated and reviewed during this visit: Problem  Chronic sacroiliac joint pain (Right)   Plan of Care  Pharmacotherapy (Medications Ordered): No orders of the defined types were placed in this encounter.  Medications administered today: [name] had no medications administered during this visit.   Procedure Orders     Caudal Epidural Injection Lab Orders  No laboratory test(s) ordered today   Imaging Orders  No imaging studies ordered today   Referral Orders  No referral(s) requested today   Interventional management options: Planned, scheduled, and/or pending:   NOTE: NO RFA until BMI is <35. PRN: Therapeutic (Midline) caudal epidural steroid injection #2 under fluoroscopic guidance and IV sedation, for the sacral and tailbone pain.   Considering:   Diagnostic bilateral lumbar facet block #4 Possible bilateral lumbar facet RFA Therapeutic left-sided L4-5 interlaminar LESI #2   Diagnostic L5-S1 LESI Diagnostic bilateral L3transforaminal ESI Diagnostic bilateral L4transforaminal ESI Diagnostic bilateral L5 transforaminal ESI Diagnostic right-sided sacroiliac joint block Possible  right-sided sacroiliac joint RFA Diagnostic bilateral lumbar facet block Possible bilateral lumbar facet RFA Diagnostic right-sided intra-articular hip joint injection Diagnostic right-sided femoral nerve +Obturatornerve block Possible right-sided femoral nerve + obturator RFA Diagnostic (Midline) caudal ESI #2    Palliative PRN treatment(s):   Palliative bilateral lumbar facet blocks Palliative/Therapeutic (Midline) caudal epidural steroid injection #2    Provider-requested follow-up: Return for PRN Procedure: (ML) Caudal ESI #2 for tailbone and sacral pain..  No future appointments. Primary Care Physician: Tracie Harrier, MD Location: [facility] Outpatient [facility] Management Facility Note by: Gaspar Cola, MD Date: 03/29/2018; Time: 9:37 AM

## 2018-03-29 ENCOUNTER — Ambulatory Visit: Payer: PPO | Attending: Pain Medicine | Admitting: Pain Medicine

## 2018-03-29 ENCOUNTER — Other Ambulatory Visit: Payer: Self-pay

## 2018-03-29 ENCOUNTER — Encounter: Payer: Self-pay | Admitting: Pain Medicine

## 2018-03-29 VITALS — BP 105/67 | HR 89 | Temp 97.6°F | Ht 63.0 in | Wt 222.0 lb

## 2018-03-29 DIAGNOSIS — M4316 Spondylolisthesis, lumbar region: Secondary | ICD-10-CM | POA: Insufficient documentation

## 2018-03-29 DIAGNOSIS — M8448XA Pathological fracture, other site, initial encounter for fracture: Secondary | ICD-10-CM | POA: Insufficient documentation

## 2018-03-29 DIAGNOSIS — Z8249 Family history of ischemic heart disease and other diseases of the circulatory system: Secondary | ICD-10-CM | POA: Insufficient documentation

## 2018-03-29 DIAGNOSIS — M5136 Other intervertebral disc degeneration, lumbar region: Secondary | ICD-10-CM | POA: Diagnosis not present

## 2018-03-29 DIAGNOSIS — Z5181 Encounter for therapeutic drug level monitoring: Secondary | ICD-10-CM | POA: Insufficient documentation

## 2018-03-29 DIAGNOSIS — M533 Sacrococcygeal disorders, not elsewhere classified: Secondary | ICD-10-CM | POA: Diagnosis not present

## 2018-03-29 DIAGNOSIS — G894 Chronic pain syndrome: Secondary | ICD-10-CM | POA: Insufficient documentation

## 2018-03-29 DIAGNOSIS — Z79891 Long term (current) use of opiate analgesic: Secondary | ICD-10-CM | POA: Insufficient documentation

## 2018-03-29 DIAGNOSIS — E1142 Type 2 diabetes mellitus with diabetic polyneuropathy: Secondary | ICD-10-CM | POA: Insufficient documentation

## 2018-03-29 DIAGNOSIS — G8929 Other chronic pain: Secondary | ICD-10-CM

## 2018-03-29 DIAGNOSIS — M8448XS Pathological fracture, other site, sequela: Secondary | ICD-10-CM

## 2018-03-29 DIAGNOSIS — M47897 Other spondylosis, lumbosacral region: Secondary | ICD-10-CM | POA: Insufficient documentation

## 2018-03-29 DIAGNOSIS — M5388 Other specified dorsopathies, sacral and sacrococcygeal region: Secondary | ICD-10-CM

## 2018-03-29 DIAGNOSIS — M79605 Pain in left leg: Secondary | ICD-10-CM | POA: Diagnosis not present

## 2018-03-29 DIAGNOSIS — M545 Low back pain: Secondary | ICD-10-CM

## 2018-03-29 DIAGNOSIS — Z86711 Personal history of pulmonary embolism: Secondary | ICD-10-CM | POA: Insufficient documentation

## 2018-03-29 DIAGNOSIS — Z888 Allergy status to other drugs, medicaments and biological substances status: Secondary | ICD-10-CM | POA: Insufficient documentation

## 2018-03-29 DIAGNOSIS — M4807 Spinal stenosis, lumbosacral region: Secondary | ICD-10-CM | POA: Diagnosis not present

## 2018-03-29 DIAGNOSIS — M5442 Lumbago with sciatica, left side: Secondary | ICD-10-CM | POA: Diagnosis not present

## 2018-03-29 DIAGNOSIS — M48061 Spinal stenosis, lumbar region without neurogenic claudication: Secondary | ICD-10-CM | POA: Diagnosis not present

## 2018-03-29 DIAGNOSIS — Z7984 Long term (current) use of oral hypoglycemic drugs: Secondary | ICD-10-CM | POA: Insufficient documentation

## 2018-03-29 DIAGNOSIS — M79604 Pain in right leg: Secondary | ICD-10-CM | POA: Diagnosis not present

## 2018-03-29 DIAGNOSIS — Z79899 Other long term (current) drug therapy: Secondary | ICD-10-CM | POA: Insufficient documentation

## 2018-03-29 DIAGNOSIS — I110 Hypertensive heart disease with heart failure: Secondary | ICD-10-CM | POA: Diagnosis not present

## 2018-03-29 DIAGNOSIS — M1288 Other specific arthropathies, not elsewhere classified, other specified site: Secondary | ICD-10-CM | POA: Insufficient documentation

## 2018-03-29 DIAGNOSIS — M1611 Unilateral primary osteoarthritis, right hip: Secondary | ICD-10-CM | POA: Insufficient documentation

## 2018-03-29 DIAGNOSIS — Z885 Allergy status to narcotic agent status: Secondary | ICD-10-CM | POA: Insufficient documentation

## 2018-03-29 DIAGNOSIS — Z6839 Body mass index (BMI) 39.0-39.9, adult: Secondary | ICD-10-CM | POA: Diagnosis not present

## 2018-03-29 DIAGNOSIS — M5441 Lumbago with sciatica, right side: Secondary | ICD-10-CM | POA: Insufficient documentation

## 2018-03-29 DIAGNOSIS — K5730 Diverticulosis of large intestine without perforation or abscess without bleeding: Secondary | ICD-10-CM | POA: Insufficient documentation

## 2018-03-29 DIAGNOSIS — I5022 Chronic systolic (congestive) heart failure: Secondary | ICD-10-CM | POA: Diagnosis not present

## 2018-03-29 DIAGNOSIS — Z7982 Long term (current) use of aspirin: Secondary | ICD-10-CM | POA: Insufficient documentation

## 2018-03-29 DIAGNOSIS — Z833 Family history of diabetes mellitus: Secondary | ICD-10-CM | POA: Insufficient documentation

## 2018-03-29 DIAGNOSIS — M25552 Pain in left hip: Secondary | ICD-10-CM | POA: Diagnosis not present

## 2018-03-29 NOTE — Patient Instructions (Signed)
____________________________________________________________________________________________  Preparing for Procedure with Sedation  Instructions: . Oral Intake: Do not eat or drink anything for at least 8 hours prior to your procedure. . Transportation: Public transportation is not allowed. Bring an adult driver. The driver must be physically present in our waiting room before any procedure can be started. Marland Kitchen Physical Assistance: Bring an adult physically capable of assisting you, in the event you need help. This adult should keep you company at home for at least 6 hours after the procedure. . Blood Pressure Medicine: Take your blood pressure medicine with a sip of water the morning of the procedure. . Blood thinners: Notify our staff if you are taking any blood thinners. Depending on which one you take, there will be specific instructions on how and when to stop it. . Diabetics on insulin: Notify the staff so that you can be scheduled 1st case in the morning. If your diabetes requires high dose insulin, take only  of your normal insulin dose the morning of the procedure and notify the staff that you have done so. . Preventing infections: Shower with an antibacterial soap the morning of your procedure. . Build-up your immune system: Take 1000 mg of Vitamin C with every meal (3 times a day) the day prior to your procedure. Marland Kitchen Antibiotics: Inform the staff if you have a condition or reason that requires you to take antibiotics before dental procedures. . Pregnancy: If you are pregnant, call and cancel the procedure. . Sickness: If you have a cold, fever, or any active infections, call and cancel the procedure. . Arrival: You must be in the facility at least 30 minutes prior to your scheduled procedure. . Children: Do not bring children with you. . Dress appropriately: Bring dark clothing that you would not mind if they get stained. . Valuables: Do not bring any jewelry or valuables.  Procedure  appointments are reserved for interventional treatments only. Marland Kitchen No Prescription Refills. . No medication changes will be discussed during procedure appointments. . No disability issues will be discussed.  Reasons to call and reschedule or cancel your procedure: (Following these recommendations will minimize the risk of a serious complication.) . Surgeries: Avoid having procedures within 2 weeks of any surgery. (Avoid for 2 weeks before or after any surgery). . Flu Shots: Avoid having procedures within 2 weeks of a flu shots or . (Avoid for 2 weeks before or after immunizations). . Barium: Avoid having a procedure within 7-10 days after having had a radiological study involving the use of radiological contrast. (Myelograms, Barium swallow or enema study). . Heart attacks: Avoid any elective procedures or surgeries for the initial 6 months after a "Myocardial Infarction" (Heart Attack). . Blood thinners: It is imperative that you stop these medications before procedures. Let us know if you if you take any blood thinner.  . Infection: Avoid procedures during or within two weeks of an infection (including chest colds or gastrointestinal problems). Symptoms associated with infections include: Localized redness, fever, chills, night sweats or profuse sweating, burning sensation when voiding, cough, congestion, stuffiness, runny nose, sore throat, diarrhea, nausea, vomiting, cold or Flu symptoms, recent or current infections. It is specially important if the infection is over the area that we intend to treat. Marland Kitchen Heart and lung problems: Symptoms that may suggest an active cardiopulmonary problem include: cough, chest pain, breathing difficulties or shortness of breath, dizziness, ankle swelling, uncontrolled high or unusually low blood pressure, and/or palpitations. If you are experiencing any of these symptoms, cancel  your procedure and contact your primary care physician for an evaluation.  Remember:   Regular Business hours are:  Monday to Thursday 8:00 AM to 4:00 PM  Provider's Schedule: Milinda Pointer, MD:  Procedure days: Tuesday and Thursday 7:30 AM to 4:00 PM  Gillis Santa, MD:  Procedure days: Monday and Wednesday 7:30 AM to 4:00 PM ____________________________________________________________________________________________

## 2018-04-10 ENCOUNTER — Encounter: Payer: Self-pay | Admitting: Emergency Medicine

## 2018-04-10 ENCOUNTER — Emergency Department
Admission: EM | Admit: 2018-04-10 | Discharge: 2018-04-10 | Disposition: A | Discharge status: Home / Self Care | Payer: PPO | Attending: Emergency Medicine | Admitting: Emergency Medicine

## 2018-04-10 ENCOUNTER — Emergency Department: Payer: PPO

## 2018-04-10 DIAGNOSIS — Z7901 Long term (current) use of anticoagulants: Secondary | ICD-10-CM | POA: Insufficient documentation

## 2018-04-10 DIAGNOSIS — I82411 Acute embolism and thrombosis of right femoral vein: Secondary | ICD-10-CM | POA: Diagnosis not present

## 2018-04-10 DIAGNOSIS — I509 Heart failure, unspecified: Secondary | ICD-10-CM | POA: Insufficient documentation

## 2018-04-10 DIAGNOSIS — I82431 Acute embolism and thrombosis of right popliteal vein: Secondary | ICD-10-CM | POA: Insufficient documentation

## 2018-04-10 DIAGNOSIS — E1140 Type 2 diabetes mellitus with diabetic neuropathy, unspecified: Secondary | ICD-10-CM | POA: Diagnosis not present

## 2018-04-10 DIAGNOSIS — M79604 Pain in right leg: Secondary | ICD-10-CM | POA: Diagnosis present

## 2018-04-10 DIAGNOSIS — I110 Hypertensive heart disease with heart failure: Secondary | ICD-10-CM | POA: Diagnosis not present

## 2018-04-10 DIAGNOSIS — Z91040 Latex allergy status: Secondary | ICD-10-CM | POA: Diagnosis not present

## 2018-04-10 DIAGNOSIS — Z79899 Other long term (current) drug therapy: Secondary | ICD-10-CM | POA: Diagnosis not present

## 2018-04-10 DIAGNOSIS — Z7984 Long term (current) use of oral hypoglycemic drugs: Secondary | ICD-10-CM | POA: Insufficient documentation

## 2018-04-10 DIAGNOSIS — Z7982 Long term (current) use of aspirin: Secondary | ICD-10-CM | POA: Diagnosis not present

## 2018-04-10 MED ORDER — RIVAROXABAN 15 MG PO TABS
15.0000 mg | ORAL_TABLET | ORAL | Status: AC
  Administered 2018-04-10: 15 mg via ORAL
  Filled 2018-04-10: qty 1

## 2018-04-10 MED ORDER — RIVAROXABAN (XARELTO) VTE STARTER PACK (15 & 20 MG): ORAL_TABLET | ORAL | 0 refills | Status: DC

## 2018-04-10 MED ORDER — RIVAROXABAN 15 MG PO TABS: 15.0000 mg | ORAL_TABLET | Freq: Once | ORAL | Status: DC

## 2018-04-10 NOTE — ED Triage Notes (Addendum)
Patient presents to the ED with right leg pain since Friday and noticeable swelling today.  Patient reports history of blood clots.(PE x2)  Patient is in no obvious distress at this time.

## 2018-04-10 NOTE — Discharge Instructions (Signed)
Your ultrasound of the right leg shows a blood clot in the vein behind your knee.  Start the [employer] [facility] following the instructions on the package which should indicate a starting dosage of 15 mg tablet 2 times a day for 3 weeks followed by 120 mg tablet once a day afterward.  Follow-up with your doctor for continued monitoring of your symptoms.  Return to the ER immediately if you have sudden onset of chest pain or shortness of breath.

## 2018-04-10 NOTE — ED Provider Notes (Signed)
[facility] Emergency Department Provider Note  ____________________________________________  Time seen: Approximately 5:40 PM  I have reviewed the triage vital signs and the nursing notes.   HISTORY  Chief Complaint Leg Pain and Leg Swelling    HPI Kara [name] is a 67 y.o. female with a history of CHF diabetes and pulmonary embolism who reports that she has had right lower leg pain and swelling for the past 2 days.  No recent travel trauma hospitalization or surgery.  She is not currently on any anticoagulants except for 81 mg aspirin.  She previously had a IVC filter which was removed in March of this year.  She denies any chest pain shortness of breath fevers or chills currently.  Symptoms are constant, nonradiating, no aggravating or alleviating factors.      Past Medical History:  Diagnosis Date  . Anginal pain ([place])   . 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 CHF (congestive heart failure) ([place])   . Diabetes ([place])   . Diverticulitis   . Hypertension   . PE (pulmonary thromboembolism) ([place])   . 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[facility])      Patient Active Problem List   Diagnosis Date Noted  . Morbid obesity with BMI of 45.0-49.9, adult ([place]) 03/15/2018  . Secondary osteoarthritis of multiple sites 03/15/2018  . Sacral insufficiency fracture 03/15/2018  . Chronic low back pain (Primary Area of Pain) (Bilateral) 12/14/2017  . Spondylosis without myelopathy or radiculopathy, lumbosacral region 12/14/2017  . Other specified dorsopathies, sacral and sacrococcygeal region 12/14/2017  . History of allergy to latex 11/09/2017  . Hyponatremia 11/02/2017  . Hypocalcemia 11/02/2017  . 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 11/02/2017  . Lumbar foraminal stenosis (L3-4 and L4-5) (Bilateral) 11/02/2017  . Lumbosacral L5-S1 subarticular lateral recess stenosis (Bilateral) 11/02/2017  . Lumbar facet  arthropathy (L3-4, L4-5, and L5-S1) (Bilateral) 11/02/2017  . Lumbar facet syndrome (Bilateral) 11/02/2017  . 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 Lumbar pars defect (L3 and L4) (Bilateral) 11/02/2017  . Diabetic peripheral neuropathy ([place]) 11/02/2017  . Chronic lower extremity pain (Secondary Area of Pain) (Bilateral) 11/02/2017  . Chronic radicular pain of lower extremity 11/02/2017  . Osteoarthritis of hip (Right) 11/02/2017  . Chronic low back pain (Primary Area of Pain) (Bilateral) w/ sciatica (Bilateral) 10/13/2017  . Chronic sacroiliac joint pain (Right) 10/13/2017  . Chronic pain syndrome 10/13/2017  . Long term current use of opiate analgesic 10/13/2017  . Pharmacologic therapy 10/13/2017  . Disorder of skeletal system 10/13/2017  . Problems influencing health status 10/13/2017  . Diverticulosis of colon 06/20/2017  . History of pulmonary embolism 06/20/2017  . S/P IVC filter 06/20/2017  . H/O ileostomy 06/01/2017  . Positive result for methicillin resistant Staphylococcus aureus (MRSA) screening 05/11/2017  . Ileostomy in place [facility]) 04/08/2017  . History of colostomy reversal 02/24/2017  . Other pulmonary embolism without acute cor pulmonale ([place]) 11/23/2016  . Acute on chronic systolic (congestive) heart failure ([place]) 11/18/2016  . HCAP (healthcare-associated pneumonia) 11/18/2016  . Encounter for removal of staples   . Edema of both legs 11/15/2016  . Morbidly obese ([place]) 11/15/2016  . Diverticulitis 10/03/2016  . Diverticulitis large intestine 09/29/2016  . Benign essential hypertension 08/26/2016  . DDD (degenerative disc disease), lumbar 07/14/2016  . Acute non-recurrent maxillary sinusitis 05/21/2016  . Chronic hip pain (Left) 02/23/2016  . Cough 07/30/2015  . Nocturnal leg cramps  02/20/2015  . Diabetes mellitus type 2 in obese ([place]) 11/12/2014  . Type 2 diabetes mellitus without complication, without long-term current use of  insulin ([place]) 11/12/2014  . BP (high blood pressure) 11/12/2014     Past Surgical History:  Procedure Laterality Date  . APPENDECTOMY N/A 02/24/2017   Procedure: Incidental  APPENDECTOMY;  Surgeon: [name], MD;  Location: [name] ORS;  Service: General;  Laterality: N/A;  . CARPAL TUNNEL RELEASE Bilateral   . COLON RESECTION SIGMOID N/A 11/02/2016   Procedure: COLON RESECTION SIGMOID;  Surgeon: [name], MD;  Location: [name] ORS;  Service: General;  Laterality: N/A;  . COLON SURGERY  11/02/2016  . COLONOSCOPY WITH PROPOFOL N/A 02/03/2017   Procedure: COLONOSCOPY WITH PROPOFOL;  Surgeon: Lin Landsman, MD;  Location: [facility] ENDOSCOPY;  Service: Gastroenterology;  Laterality: N/A;  . COLOSTOMY N/A 11/02/2016   Procedure: COLOSTOMY;  Surgeon: [name], MD;  Location: [name] ORS;  Service: General;  Laterality: N/A;  . COLOSTOMY REVERSAL N/A 02/24/2017   Procedure: COLOSTOMY REVERSAL, ostomy takedown, spleenic flexure mobilization, excision rectal stump/distal sigmoid, anastomosis with suture reinforcement;  Surgeon: [name], MD;  Location: [name] ORS;  Service: General;  Laterality: N/A;  . ILEO LOOP DIVERSION N/A 02/24/2017   Procedure: ILEO LOOP COLOSTOMY;  Surgeon: [name], MD;  Location: [name] ORS;  Service: General;  Laterality: N/A;  . ILEOSTOMY CLOSURE N/A 06/01/2017   Procedure: LOOP ILEOSTOMY TAKEDOWN;  Surgeon: [name], MD;  Location: [name] ORS;  Service: General;  Laterality: N/A;  . IVC FILTER INSERTION Right 10/2016  . IVC FILTER REMOVAL N/A 08/09/2017   Procedure: IVC FILTER REMOVAL;  Surgeon: Katha Cabal, MD;  Location: [place] CV LAB;  Service: Cardiovascular;  Laterality: N/A;  . KNEE SURGERY     torn menicus  . LAPAROSCOPY N/A 02/24/2017   Procedure: LAPAROSCOPY DIAGNOSTIC;  Surgeon: [name], MD;  Location: [name] ORS;  Service: General;  Laterality: N/A;  . LYSIS OF ADHESION N/A 02/24/2017   Procedure: LYSIS OF  ADHESION;  Surgeon: [name], MD;  Location: [name] ORS;  Service: General;  Laterality: N/A;  . PULMONARY VENOGRAPHY N/A 11/19/2016   Procedure: Pulmonary Venography; IVC filter placement; possible pulmonary thrombectomy;  Surgeon: Katha Cabal, MD;  Location: [place] CV LAB;  Service: Cardiovascular;  Laterality: N/A;  . SACROPLASTY N/A 11/08/2017   Procedure: SACROPLASTY S2;  Surgeon: Hessie Knows, MD;  Location: [name] ORS;  Service: Orthopedics;  Laterality: N/A;  . SIGMOIDOSCOPY N/A 11/13/2016   Procedure: endoscopic  flexible SIGMOIDOSCOPY;  Surgeon: Florene Glen, MD;  Location: [name] ORS;  Service: General;  Laterality: N/A;  . SIGMOIDOSCOPY N/A 05/19/2017   Procedure: Beryle Quant;  Surgeon: [name], MD;  Location: [name] ORS;  Service: General;  Laterality: N/A;     Prior to Admission medications   Medication Sig Start Date End Date Taking? Authorizing Provider  aspirin EC 81 MG tablet Take 81 mg by mouth daily.    [provider]  candesartan (ATACAND) 16 MG tablet Take 8 mg by mouth daily.  08/03/17   [provider]  cyclobenzaprine (FLEXERIL) 10 MG tablet Take 10 mg by mouth at bedtime.  07/05/16   [provider]  gabapentin (NEURONTIN) 100 MG capsule Take 200 mg by mouth 3 (three) times daily.     [provider]  glipiZIDE (GLUCOTROL XL) 5 MG 24 hr tablet Take 5 mg by mouth daily with breakfast.  10/27/16   [provider]  meclizine (ANTIVERT)  25 MG tablet  12/29/17   [provider]  Multiple Vitamin (MULTI-VITAMINS) TABS Take 1 tablet by mouth daily.    [provider]  Rivaroxaban 15 & 20 MG TBPK Take as directed: Start with one 15mg  tablet by mouth twice a day with food. On Day 22, switch to one 20mg  tablet once a day with food. 04/10/18   Carrie Mew, MD  rOPINIRole (REQUIP) 0.5 MG tablet Take 0.5 mg by mouth at bedtime.    [provider]     Allergies Adhesive  [tape]; Morphine; Other; Latex; and Morphine and related   Family History  Problem Relation Age of Onset  . Diabetes Mother   . Heart disease Mother   . Cancer Mother   . Breast cancer Mother   . Heart disease Father   . Diabetes Sister   . Heart disease Sister   . Breast cancer Cousin     Social History Social History   Tobacco Use  . Smoking status: Never Smoker  . Smokeless tobacco: Never Used  Substance Use Topics  . Alcohol use: No    Alcohol/week: 0.0 standard drinks  . Drug use: No    Review of Systems  Constitutional:   No fever or chills.  ENT:   No sore throat. No rhinorrhea. Cardiovascular:   No chest pain or syncope. Respiratory:   No dyspnea or cough. Gastrointestinal:   Negative for abdominal pain, vomiting and diarrhea.  Musculoskeletal: Right leg pain and swelling as above. All other systems reviewed and are negative except as documented above in ROS and HPI.  ____________________________________________   PHYSICAL EXAM:  VITAL SIGNS: ED Triage Vitals  Enc Vitals Group     BP 04/10/18 1424 126/64     Pulse Rate 04/10/18 1424 (!) 103     Resp 04/10/18 1424 (!) 22     Temp 04/10/18 1424 97.9 F (36.6 C)     Temp Source 04/10/18 1424 Oral     SpO2 04/10/18 1424 94 %     Weight 04/10/18 1426 221 lb (100.2 kg)     Height 04/10/18 1426 5\' 3"  (1.6 m)     Head Circumference --      Peak Flow --      Pain Score 04/10/18 1429 0     Pain Loc --      Pain Edu? --      Excl. in [place]? --     Vital signs reviewed, nursing assessments reviewed.   Constitutional:   Alert and oriented. Non-toxic appearance. Eyes:   Conjunctivae are normal. EOMI. PERRL. ENT      Head:   Normocephalic and atraumatic.      Nose:   No congestion/rhinnorhea.       Mouth/Throat:   MMM, no pharyngeal erythema. No peritonsillar mass.       Neck:   No meningismus. Full ROM. Hematological/Lymphatic/Immunilogical:   No cervical lymphadenopathy. Cardiovascular:   RRR.  Symmetric bilateral radial and DP pulses.  No murmurs. Cap refill less than 2 seconds. Respiratory:   Normal respiratory effort without tachypnea/retractions. Breath sounds are clear and equal bilaterally. No wheezes/rales/rhonchi. Gastrointestinal:   Soft and nontender. Non distended. There is no CVA tenderness.  No rebound, rigidity, or guarding. Musculoskeletal:   Normal range of motion in all extremities. No joint effusions.  Swelling of right leg with enlarged calf circumference.  Tenderness in the popliteal fossa.  No inflammatory changes. Neurologic:   Normal speech and language.  Motor grossly intact.  No acute focal neurologic deficits are appreciated.  Skin:    Skin is warm, dry and intact. No rash noted.  No petechiae, purpura, or bullae.  ____________________________________________    LABS (pertinent positives/negatives) (all labs ordered are listed, but only abnormal results are displayed) Labs Reviewed - No data to display ____________________________________________   EKG    ____________________________________________    RADIOLOGY  US Venous Img Lower Unilateral Right  Result Date: 04/10/2018 CLINICAL DATA:  Swelling and pain x4 days EXAM: RIGHT LOWER EXTREMITY VENOUS DOPPLER ULTRASOUND TECHNIQUE: Gray-scale sonography with compression, as well as color and duplex ultrasound, were performed to evaluate the deep venous system from the level of the common femoral vein through the popliteal and proximal calf veins. COMPARISON:  None FINDINGS: Normal compressibility of the common femoral vein, saphenofemoral junction, deep femoral vein, and proximal and mid femoral vein. There is incomplete compressibility of the distal femoral vein and proximal popliteal vein, with continued antegrade flow noted on color Doppler. There is occlusive thrombus in the distal popliteal vein and posterior tibial vein. Limited evaluation of the peroneal vein. Survey views of the contralateral  common femoral vein are unremarkable. IMPRESSION: 1. POSITIVE for calf and femoropopliteal acute DVT. Electronically Signed   By: Lucrezia Europe M.D.   On: 04/10/2018 16:06    ____________________________________________   PROCEDURES Procedures  ____________________________________________    CLINICAL IMPRESSION / ASSESSMENT AND PLAN / ED COURSE  Pertinent labs & imaging results that were available during my care of the patient were reviewed by me and considered in my medical decision making (see chart for details).    Patient presents with right leg pain and swelling.  Ultrasound is positive for an acute occlusive DVT in the popliteal vein.  No proximal DVT.  No symptoms to suggest pulmonary embolism.  She tolerated Xarelto well in the past.  Plan to restart Xarelto and advise close follow-up with primary care.  Given first dose of Xarelto here in the ED prior to discharge.      ____________________________________________   FINAL CLINICAL IMPRESSION(S) / ED DIAGNOSES    Final diagnoses:  Acute deep vein thrombosis (DVT) of popliteal vein of right lower extremity [facility])     ED Discharge Orders         Ordered    Rivaroxaban 15 & 20 MG TBPK     04/10/18 1704          Portions of this note were generated with dragon dictation software. Dictation errors may occur despite best attempts at proofreading.    Carrie Mew, MD 04/10/18 [id number]

## 2018-04-11 ENCOUNTER — Encounter: Payer: Self-pay | Admitting: Pain Medicine

## 2018-04-11 ENCOUNTER — Other Ambulatory Visit: Payer: Self-pay

## 2018-04-11 ENCOUNTER — Ambulatory Visit: Payer: PPO | Attending: Pain Medicine | Admitting: Pain Medicine

## 2018-04-11 VITALS — BP 144/84 | HR 106 | Temp 97.7°F | Resp 18 | Ht 63.0 in | Wt 221.0 lb

## 2018-04-11 DIAGNOSIS — G894 Chronic pain syndrome: Secondary | ICD-10-CM

## 2018-04-11 DIAGNOSIS — M5137 Other intervertebral disc degeneration, lumbosacral region: Secondary | ICD-10-CM | POA: Insufficient documentation

## 2018-04-11 DIAGNOSIS — M5410 Radiculopathy, site unspecified: Secondary | ICD-10-CM | POA: Diagnosis not present

## 2018-04-11 DIAGNOSIS — M549 Dorsalgia, unspecified: Secondary | ICD-10-CM | POA: Diagnosis not present

## 2018-04-11 DIAGNOSIS — M8448XS Pathological fracture, other site, sequela: Secondary | ICD-10-CM | POA: Diagnosis not present

## 2018-04-11 DIAGNOSIS — M5388 Other specified dorsopathies, sacral and sacrococcygeal region: Secondary | ICD-10-CM | POA: Insufficient documentation

## 2018-04-11 DIAGNOSIS — Z91040 Latex allergy status: Secondary | ICD-10-CM | POA: Diagnosis not present

## 2018-04-11 DIAGNOSIS — M4316 Spondylolisthesis, lumbar region: Secondary | ICD-10-CM | POA: Insufficient documentation

## 2018-04-11 DIAGNOSIS — Z79891 Long term (current) use of opiate analgesic: Secondary | ICD-10-CM | POA: Insufficient documentation

## 2018-04-11 DIAGNOSIS — I110 Hypertensive heart disease with heart failure: Secondary | ICD-10-CM | POA: Diagnosis not present

## 2018-04-11 DIAGNOSIS — M533 Sacrococcygeal disorders, not elsewhere classified: Secondary | ICD-10-CM | POA: Diagnosis not present

## 2018-04-11 DIAGNOSIS — I5023 Acute on chronic systolic (congestive) heart failure: Secondary | ICD-10-CM | POA: Diagnosis not present

## 2018-04-11 DIAGNOSIS — M48061 Spinal stenosis, lumbar region without neurogenic claudication: Secondary | ICD-10-CM | POA: Diagnosis not present

## 2018-04-11 DIAGNOSIS — M4310 Spondylolisthesis, site unspecified: Secondary | ICD-10-CM

## 2018-04-11 DIAGNOSIS — Z7982 Long term (current) use of aspirin: Secondary | ICD-10-CM | POA: Insufficient documentation

## 2018-04-11 DIAGNOSIS — E871 Hypo-osmolality and hyponatremia: Secondary | ICD-10-CM | POA: Insufficient documentation

## 2018-04-11 DIAGNOSIS — Z86711 Personal history of pulmonary embolism: Secondary | ICD-10-CM | POA: Diagnosis not present

## 2018-04-11 DIAGNOSIS — M8448XA Pathological fracture, other site, initial encounter for fracture: Secondary | ICD-10-CM | POA: Diagnosis not present

## 2018-04-11 DIAGNOSIS — Z8249 Family history of ischemic heart disease and other diseases of the circulatory system: Secondary | ICD-10-CM | POA: Insufficient documentation

## 2018-04-11 DIAGNOSIS — Z888 Allergy status to other drugs, medicaments and biological substances status: Secondary | ICD-10-CM | POA: Insufficient documentation

## 2018-04-11 DIAGNOSIS — M5116 Intervertebral disc disorders with radiculopathy, lumbar region: Secondary | ICD-10-CM | POA: Insufficient documentation

## 2018-04-11 DIAGNOSIS — Z5181 Encounter for therapeutic drug level monitoring: Secondary | ICD-10-CM | POA: Diagnosis not present

## 2018-04-11 DIAGNOSIS — Z885 Allergy status to narcotic agent status: Secondary | ICD-10-CM | POA: Insufficient documentation

## 2018-04-11 DIAGNOSIS — I824Z9 Acute embolism and thrombosis of unspecified deep veins of unspecified distal lower extremity: Secondary | ICD-10-CM

## 2018-04-11 DIAGNOSIS — E119 Type 2 diabetes mellitus without complications: Secondary | ICD-10-CM | POA: Insufficient documentation

## 2018-04-11 DIAGNOSIS — M4807 Spinal stenosis, lumbosacral region: Secondary | ICD-10-CM | POA: Diagnosis not present

## 2018-04-11 DIAGNOSIS — M1611 Unilateral primary osteoarthritis, right hip: Secondary | ICD-10-CM | POA: Insufficient documentation

## 2018-04-11 DIAGNOSIS — Z86718 Personal history of other venous thrombosis and embolism: Secondary | ICD-10-CM | POA: Insufficient documentation

## 2018-04-11 DIAGNOSIS — Z9889 Other specified postprocedural states: Secondary | ICD-10-CM | POA: Insufficient documentation

## 2018-04-11 DIAGNOSIS — G8929 Other chronic pain: Secondary | ICD-10-CM

## 2018-04-11 DIAGNOSIS — M4300 Spondylolysis, site unspecified: Secondary | ICD-10-CM

## 2018-04-11 DIAGNOSIS — K5730 Diverticulosis of large intestine without perforation or abscess without bleeding: Secondary | ICD-10-CM | POA: Diagnosis not present

## 2018-04-11 DIAGNOSIS — Z79899 Other long term (current) drug therapy: Secondary | ICD-10-CM | POA: Insufficient documentation

## 2018-04-11 DIAGNOSIS — Z7901 Long term (current) use of anticoagulants: Secondary | ICD-10-CM | POA: Insufficient documentation

## 2018-04-11 DIAGNOSIS — Z6839 Body mass index (BMI) 39.0-39.9, adult: Secondary | ICD-10-CM | POA: Diagnosis not present

## 2018-04-11 DIAGNOSIS — M51379 Other intervertebral disc degeneration, lumbosacral region without mention of lumbar back pain or lower extremity pain: Secondary | ICD-10-CM | POA: Insufficient documentation

## 2018-04-11 MED ORDER — HYDROCODONE-ACETAMINOPHEN 5-325 MG PO TABS: 1.0000 | ORAL_TABLET | Freq: Three times a day (TID) | ORAL | 0 refills | Status: DC | PRN

## 2018-04-11 NOTE — Progress Notes (Signed)
Patient's Name: Kara [name]  MRN: [id number]  Referring Provider: Tracie Harrier, MD  DOB: [birth year]  PCP: Tracie Harrier, MD  DOS: 04/11/2018  Note by: Gaspar Cola, MD  Service setting: Ambulatory outpatient  Specialty: Interventional Pain Management  Location: [name] (AMB) Pain Management Facility    Patient type: Established   Primary Reason(s) for Visit: Encounter for prescription drug management. (Level of risk: moderate)  CC: Back Pain  HPI  Ms. Strohm is a 67 y.o. year old, female patient, who comes today for a medication management evaluation. She has Diabetes mellitus type 2 in obese ([place]); Type 2 diabetes mellitus without complication, without long-term current use of insulin ([place]); BP (high blood pressure); Nocturnal leg cramps; Cough; Chronic hip pain (Left); Acute non-recurrent maxillary sinusitis; Diverticulitis large intestine; Diverticulitis; DDD (degenerative disc disease), lumbar; Benign essential hypertension; Edema of both legs; Morbidly obese ([place]); Acute on chronic systolic (congestive) heart failure ([place]); HCAP (healthcare-associated pneumonia); Encounter for removal of staples; Other pulmonary embolism without acute cor pulmonale ([place]); History of colostomy reversal; Ileostomy in place [facility]); Positive result for methicillin resistant Staphylococcus aureus (MRSA) screening; H/O ileostomy; Diverticulosis of colon; History of pulmonary embolism; S/P IVC filter; Chronic low back pain (Primary Area of Pain) (Bilateral) w/ sciatica (Bilateral); Chronic sacroiliac joint pain (Right); Chronic pain syndrome; Long term current use of opiate analgesic; Pharmacologic therapy; Disorder of skeletal system; Problems influencing health status; Hyponatremia; Hypocalcemia; 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-S1 anterolisthesis.; Lumbar foraminal stenosis (L3-4 and L4-5) (Bilateral); Lumbosacral L5-S1 subarticular lateral recess stenosis (Bilateral); Lumbar facet arthropathy  (L3-4, L4-5, and L5-S1) (Bilateral); Lumbar facet syndrome (Bilateral); 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); Lumbar pars defect (L3 and L4) (Bilateral); Diabetic peripheral neuropathy ([place]); Chronic lower extremity pain (Secondary Area of Pain) (Bilateral); Chronic radicular pain of lower extremity; Osteoarthritis of hip (Right); History of allergy to latex; Chronic low back pain (Primary Area of Pain) (Bilateral); Spondylosis without myelopathy or radiculopathy, lumbosacral region; Other specified dorsopathies, sacral and sacrococcygeal region; Morbid obesity with BMI of 45.0-49.9, adult ([place]); Secondary osteoarthritis of multiple sites; Sacral insufficiency fracture; DDD (degenerative disc disease), lumbosacral; DVT, lower extremity, distal, acute ([place]); and Chronic anticoagulation (XARELTO) on their problem list. Her primarily concern today is the Back Pain  Pain Assessment: Location: Lower, Right Back Radiating: radiates into back of leg to the calf Onset: More than a month ago Duration: Chronic pain Quality: Burning, Constant Severity: 9 /10 (subjective, self-reported pain score)  Note: Reported level is inconsistent with clinical observations. Clinically the patient looks like a 3/10 A 3/10 is viewed as "Moderate" and described as significantly interfering with activities of daily living (ADL). It becomes difficult to feed, bathe, get dressed, get on and off the toilet or to perform personal hygiene functions. Difficult to get in and out of bed or a chair without assistance. Very distracting. With effort, it can be ignored when deeply involved in activities. Ms. Neer does not seem to understand the use of our objective pain scale When using our objective Pain Scale, levels between 6 and 10/10 are said to belong in an emergency room, as it progressively worsens from a 6/10, described as severely limiting, requiring emergency care not usually available at an outpatient  pain management facility. At a 6/10 level, communication becomes difficult and requires great effort. Assistance to reach the emergency department may be required. Facial flushing and profuse sweating along with potentially dangerous increases in heart rate and blood pressure will be evident. Effect on ADL:  Limits activities Timing: Constant Modifying factors: rest BP: (!) 144/84  HR: (!) 106  Ms. Levinson was last scheduled for an appointment on 03/29/2018 for medication management. During today's appointment we reviewed Ms. Bissette chronic pain status, as well as her outpatient medication regimen.  The patient came into the clinic today for a caudal epidural steroid injection.  Unfortunately, she was recently diagnosed with a DVT and has been started on a blood thinner.  The blood thinner was given to her yesterday and she has not started taking the medication, but I am concerned that by doing an epidural steroid injection, the patient will experience a sympathectomy, which is common, and term this may open up blood vessels and allow for the clot to migrate to a dangerous location.  Because of this, today I have postponed the procedure and we will wait at least 30 days before we reassess whether or not we will do another intervention.  In addition, we will need to get clearance to stop the blood thinner before doing anything else.  Today, to assist the patient with the pain, I will be starting her on some hydrocodone which she has indicated that she has taken in the past without any problems.  She does have a history of nausea and vomiting with morphine but she indicates having taking hydrocodone and Dilaudid in the past without any problems.  We will have the patient sign a medication agreement and today we will also provide the patient with written information about our medication policy.  The patient  reports that she does not use drugs. Her body mass index is 39.15 kg/m.  Further  details on both, my assessment(s), as well as the proposed treatment plan, please see below.  Controlled Substance Pharmacotherapy Assessment REMS (Risk Evaluation and Mitigation Strategy)  Analgesic: Hydrocodone/APAP 5/325 1 tablet p.o. every 8 hours (15 mg/day of hydrocodone) MME/day: 15 mg/day.  No notes on file Pharmacokinetics: Liberation and absorption (onset of action): WNL Distribution (time to peak effect): WNL Metabolism and excretion (duration of action): WNL         Pharmacodynamics: Desired effects: Analgesia: Ms. Kocurek reports >50% benefit. Functional ability: Patient reports that medication allows her to accomplish basic ADLs Clinically meaningful improvement in function (CMIF): Sustained CMIF goals met Perceived effectiveness: Described as relatively effective, allowing for increase in activities of daily living (ADL) Undesirable effects: Side-effects or Adverse reactions: None reported Monitoring: [place] PMP: Online review of the past 54-monthperiod conducted. Compliant with practice rules and regulations Last UDS on record: No results found for: SUMMARY UDS interpretation: Compliant          Medication Assessment Form: Reviewed. Patient indicates being compliant with therapy Treatment compliance: Compliant Risk Assessment Profile: Aberrant behavior: See prior evaluations. None observed or detected today Comorbid factors increasing risk of overdose: See prior notes. No additional risks detected today Risk of substance use disorder (SUD): Low  ORT Scoring interpretation table:  Score <3 = Low Risk for SUD  Score between 4-7 = Moderate Risk for SUD  Score >8 = High Risk for Opioid Abuse   Risk Mitigation Strategies:  Patient Counseling: Covered Patient-Prescriber Agreement (PPA): Present and active  Notification to other healthcare providers: Done  Pharmacologic Plan: Today we will take over the chronic pain medication management and from this point on  our medication agreement with this patient is active.             Laboratory Chemistry  Inflammation Markers (CRP: Acute Phase) (ESR: Chronic  Phase) Lab Results  Component Value Date   CRP 5.3 (H) 11/02/2017   ESRSEDRATE 12 11/02/2017                         Rheumatology Markers No results found.  Renal Function Markers Lab Results  Component Value Date   BUN 19 10/03/2017   CREATININE 0.63 10/03/2017   GFRAA >60 10/03/2017   GFRNONAA >60 10/03/2017                             Hepatic Function Markers Lab Results  Component Value Date   AST 40 04/21/2017   ALT 53 04/21/2017   ALBUMIN 3.8 04/21/2017   ALKPHOS 88 04/21/2017   LIPASE 22 10/31/2016                        Electrolytes Lab Results  Component Value Date   NA 134 (L) 10/03/2017   K 4.0 10/03/2017   CL 104 10/03/2017   CALCIUM 8.3 (L) 10/03/2017   MG 2.0 11/02/2017   PHOS 3.3 11/07/2016                        Neuropathy Markers Lab Results  Component Value Date   VITAMINB12 367 11/02/2017   HGBA1C 7.2 (H) 02/17/2017   HIV Non Reactive 11/18/2016                        CNS Tests No results found.  Bone Pathology Markers Lab Results  Component Value Date   25OHVITD1 25 (L) 11/02/2017   25OHVITD2 <1.0 11/02/2017   25OHVITD3 25 11/02/2017                         Coagulation Parameters Lab Results  Component Value Date   PLT 200 10/03/2017                        Cardiovascular Markers Lab Results  Component Value Date   BNP 60.0 11/17/2016   CKTOTAL 71 10/02/2011   CKMB < 0.5 (L) 10/02/2011   TROPONINI <0.03 11/17/2016   HGB 16.5 (H) 10/03/2017   HCT 49.7 (H) 10/03/2017                         CA Markers No results found.  Note: Lab results reviewed.  Recent Diagnostic Imaging Review  Lumbosacral Imaging: Lumbar MR wo contrast:  Results for orders placed during the hospital encounter of 10/27/17  MR LUMBAR SPINE WO CONTRAST   Narrative CLINICAL DATA:  Lumbar  radiculopathy  EXAM: MRI LUMBAR SPINE WITHOUT CONTRAST  TECHNIQUE: Multiplanar, multisequence MR imaging of the lumbar spine was performed. No intravenous contrast was administered.  COMPARISON:  Abdominal CT 10/03/2017. 03/27/2016 MRI.  FINDINGS: Segmentation: Open S1-2 interspace when counting from the lowest ribs.  Alignment:  Grade 1 anterolisthesis at L4-5 and L5-S1.  Vertebrae:  Chronic bilateral pars defects at L3 and L4.  Horizontal fracture across the S2 body extending into the bilateral sacral ala. There is mild presacral soft tissue edema. No narrowing seen along the sacral canal and visible foramina.  Conus medullaris and cauda equina: Conus extends to the L1 level. Conus and cauda equina appear normal.  Paraspinal and other soft tissues: Presacral edema as noted  above.  Disc levels:  T12- L1: Unremarkable.  L1-L2: Tiny protrusion suggested on sagittal slices.  No impingement  L2-L3: Posterior element hypertrophy. Negative disc space. No compressive stenosis.  L3-L4: Chronic bilateral L3 pars defects. The disc is narrowed and bulging with a small superiorly directed protrusion. Moderate bilateral foraminal narrowing. Patent spinal canal  L4-L5: Chronic bilateral L4 pars defects with anterolisthesis. The disc is narrowed and bulging. Bilateral foraminal stenosis with impingement worse on the left where there is L4 flattening on sagittal images. Mild spinal canal narrowing.  L5-S1:Facet arthropathy with spurring and anterolisthesis. The disc is narrowed and bulging with a central upward pointing herniation that has regressed from prior. Moderate bilateral foraminal narrowing. Bilateral S1 impingement in the subarticular recesses.  IMPRESSION: 1. Acute to subacute sacral insufficiency fracture with transverse and horizontal components. 2. Disc and facet degeneration with chronic bilateral L3 and L4 pars defects. Chronic L4-5 and L5-S1  anterolisthesis. 3. Bilateral foraminal narrowing at L3-4 and below with impingement greatest on the left at L4-5. 4. L5-S1 bilateral subarticular recess narrowing that could affect either S1 nerve root.   Electronically Signed   By: Monte Fantasia M.D.   On: 10/27/2017 15:32    Lumbar DG 2-3 views:  Results for orders placed during the hospital encounter of 11/08/17  DG Lumbar Spine 2-3 Views   Narrative CLINICAL DATA:  Sacroplasty.  EXAM: LUMBAR SPINE - 2-3 VIEW; DG C-ARM 61-120 MIN  COMPARISON:  09/22/2017.  FINDINGS: Prior bilateral sacroplasty.  No focal abnormality otherwise noted.  IMPRESSION: Prior bilateral sacroplasty.   Electronically Signed   By: Marcello Moores  Register   On: 11/08/2017 16:20    Lumbar DG (Complete) 4+V:  Results for orders placed during the hospital encounter of 10/02/17  DG Lumbar Spine Complete   Narrative CLINICAL DATA:  Acute low back pain following injury 1 month ago. Initial encounter.  EXAM: LUMBAR SPINE - COMPLETE 4+ VIEW  COMPARISON:  04/21/2017 abdominal/pelvic CT and prior studies  FINDINGS: Grade 1 anterolisthesis of L4 on L5 is unchanged from 2017.  No acute fracture or subluxation identified.  Mild to moderate multilevel degenerative disc disease/spondylosis and moderate multilevel facet arthropathy noted.  No focal bony lesions or spondylolysis identified.  IMPRESSION: 1. No evidence of acute abnormality 2. Multilevel degenerative changes and grade 1 spondylolisthesis at L4-5 again noted.   Electronically Signed   By: Margarette Canada M.D.   On: 10/02/2017 15:22    Hip Imaging: Hip-R DG 2-3 views:  Results for orders placed during the hospital encounter of 09/26/17  DG Hip Unilat W or Wo Pelvis 2-3 Views Right   Narrative CLINICAL DATA:  Injury  EXAM: DG HIP (WITH OR WITHOUT PELVIS) 2-3V RIGHT  COMPARISON:  None.  FINDINGS: No acute fracture. No dislocation. Osteopenia. Mild narrowing of the right hip  joint space which juxta-articular sclerosis.  IMPRESSION: No acute bony pathology.   Electronically Signed   By: Marybelle Killings M.D.   On: 09/26/2017 17:48    Complexity Note: Imaging results reviewed. Results shared with Ms. Kilmer, using Layman's terms.                         Meds   Current Outpatient Medications:  .  aspirin EC 81 MG tablet, Take 81 mg by mouth daily., Disp: , Rfl:  .  candesartan (ATACAND) 16 MG tablet, Take 8 mg by mouth daily. , Disp: , Rfl:  .  cyclobenzaprine (FLEXERIL) 10 MG tablet,  Take 10 mg by mouth at bedtime. , Disp: , Rfl:  .  gabapentin (NEURONTIN) 100 MG capsule, Take 200 mg by mouth 3 (three) times daily. , Disp: , Rfl:  .  glipiZIDE (GLUCOTROL XL) 5 MG 24 hr tablet, Take 5 mg by mouth daily with breakfast. , Disp: , Rfl:  .  HYDROcodone-acetaminophen (NORCO/VICODIN) 5-325 MG tablet, Take 1 tablet by mouth every 8 (eight) hours as needed for up to 7 days for severe pain. Must last 30 days., Disp: 21 tablet, Rfl: 0 .  [START ON 04/18/2018] HYDROcodone-acetaminophen (NORCO/VICODIN) 5-325 MG tablet, Take 1 tablet by mouth every 8 (eight) hours as needed for severe pain. Must last 30 days., Disp: 90 tablet, Rfl: 0 .  meclizine (ANTIVERT) 25 MG tablet, , Disp: , Rfl:  .  Multiple Vitamin (MULTI-VITAMINS) TABS, Take 1 tablet by mouth daily., Disp: , Rfl:  .  Rivaroxaban 15 & 20 MG TBPK, Take as directed: Start with one 47m tablet by mouth twice a day with food. On Day 22, switch to one 253mtablet once a day with food., Disp: 51 each, Rfl: 0 .  rOPINIRole (REQUIP) 0.5 MG tablet, Take 0.5 mg by mouth at bedtime., Disp: , Rfl:   ROS  Constitutional: Denies any fever or chills Gastrointestinal: No reported hemesis, hematochezia, vomiting, or acute GI distress Musculoskeletal: Denies any acute onset joint swelling, redness, loss of ROM, or weakness Neurological: No reported episodes of acute onset apraxia, aphasia, dysarthria, agnosia, amnesia,  paralysis, loss of coordination, or loss of consciousness  Allergies  Ms. JaTennants allergic to adhesive [tape]; morphine; other; latex; and morphine and related.  PFSH  Drug: Ms. JaCaddenreports that she does not use drugs. Alcohol:  reports that she does not drink alcohol. Tobacco:  reports that she has never smoked. She has never used smokeless tobacco. Medical:  has a past medical history of Anginal pain ([place]CI[place] Arthritis, CHF (congestive heart failure) ([place] Diabetes (H[place] Diverticulitis, Hypertension, PE (pulmonary thromboembolism) (HCCrete and 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HCFoster Surgical: Ms. JaHardenhas a past surgical history that includes Knee surgery; Carpal tunnel release (Bilateral); Colon resection sigmoid (N/A, 11/02/2016); Colostomy (N/A, 11/02/2016); Sigmoidoscopy (N/A, 11/13/2016); PULMONARY VENOGRAPHY (N/A, 11/19/2016); Colonoscopy with propofol (N/A, 02/03/2017); Colon surgery (11/02/2016); IVC FILTER INSERTION (Right, 10/2016); Colostomy reversal (N/A, 02/24/2017); Appendectomy (N/A, 02/24/2017); Lysis of adhesion (N/A, 02/24/2017); laparoscopy (N/A, 02/24/2017); Ileo loop diversion (N/A, 02/24/2017); Sigmoidoscopy (N/A, 05/19/2017); Ileostomy closure (N/A, 06/01/2017); IVC FILTER REMOVAL (N/A, 08/09/2017); and Sacroplasty (N/A, 11/08/2017). Family: family history includes Breast cancer in her cousin and mother; Cancer in her mother; Diabetes in her mother and sister; Heart disease in her father, mother, and sister.  Constitutional Exam  General appearance: Well nourished, well developed, and well hydrated. In no apparent acute distress Vitals:   04/11/18 1224 04/11/18 1227  BP:  (!) 144/84  Pulse: (!) 106   Resp: 18   Temp: 97.7 F (36.5 C)   SpO2: 96%   Weight: 221 lb (100.2 kg)   Height: _0  (1.6 m)    BMI Assessment: Estimated body mass index is 39.15 kg/m as calculated from the following:   Height as of this encounter: _1  (1.6 m).    Weight as of this encounter: 221 lb (100.2 kg).  BMI interpretation table: BMI level Category Range association with higher incidence of chronic pain  <18 kg/m2 Underweight   18.5-24.9 kg/m2 Ideal body weight   25-29.9 kg/m2 Overweight Increased incidence by 20%  30-34.9 kg/m2  Obese (Class I) Increased incidence by 68%  35-39.9 kg/m2 Severe obesity (Class II) Increased incidence by 136%  >40 kg/m2 Extreme obesity (Class III) Increased incidence by 254%   Patient's current BMI Ideal Body weight  Body mass index is 39.15 kg/m. Ideal body weight: 52.4 kg (115 lb 8.3 oz) Adjusted ideal body weight: 71.5 kg (157 lb 11.4 oz)   BMI Readings from Last 4 Encounters:  04/11/18 39.15 kg/m  04/10/18 39.15 kg/m  03/29/18 39.33 kg/m  03/16/18 38.62 kg/m   Wt Readings from Last 4 Encounters:  04/11/18 221 lb (100.2 kg)  04/10/18 221 lb (100.2 kg)  03/29/18 222 lb (100.7 kg)  03/16/18 218 lb (98.9 kg)  Psych/Mental status: Alert, oriented x 3 (person, place, & time)       Eyes: PERLA Respiratory: No evidence of acute respiratory distress  Cervical Spine Area Exam  Skin & Axial Inspection: No masses, redness, edema, swelling, or associated skin lesions Alignment: Symmetrical Functional ROM: Unrestricted ROM      Stability: No instability detected Muscle Tone/Strength: Functionally intact. No obvious neuro-muscular anomalies detected. Sensory (Neurological): Unimpaired Palpation: No palpable anomalies              Upper Extremity (UE) Exam    Side: Right upper extremity  Side: Left upper extremity  Skin & Extremity Inspection: Skin color, temperature, and hair growth are WNL. No peripheral edema or cyanosis. No masses, redness, swelling, asymmetry, or associated skin lesions. No contractures.  Skin & Extremity Inspection: Skin color, temperature, and hair growth are WNL. No peripheral edema or cyanosis. No masses, redness, swelling, asymmetry, or associated skin lesions. No contractures.   Functional ROM: Unrestricted ROM          Functional ROM: Unrestricted ROM          Muscle Tone/Strength: Functionally intact. No obvious neuro-muscular anomalies detected.  Muscle Tone/Strength: Functionally intact. No obvious neuro-muscular anomalies detected.  Sensory (Neurological): Unimpaired          Sensory (Neurological): Unimpaired          Palpation: No palpable anomalies              Palpation: No palpable anomalies              Provocative Test(s):  Phalen's test: deferred Tinel's test: deferred Apley's scratch test (touch opposite shoulder):  Action 1 (Across chest): deferred Action 2 (Overhead): deferred Action 3 (LB reach): deferred   Provocative Test(s):  Phalen's test: deferred Tinel's test: deferred Apley's scratch test (touch opposite shoulder):  Action 1 (Across chest): deferred Action 2 (Overhead): deferred Action 3 (LB reach): deferred    Thoracic Spine Area Exam  Skin & Axial Inspection: No masses, redness, or swelling Alignment: Symmetrical Functional ROM: Unrestricted ROM Stability: No instability detected Muscle Tone/Strength: Functionally intact. No obvious neuro-muscular anomalies detected. Sensory (Neurological): Unimpaired Muscle strength & Tone: No palpable anomalies  Lumbar Spine Area Exam  Skin & Axial Inspection: No masses, redness, or swelling Alignment: Symmetrical Functional ROM: Unrestricted ROM       Stability: No instability detected Muscle Tone/Strength: Functionally intact. No obvious neuro-muscular anomalies detected. Sensory (Neurological): Unimpaired Palpation: No palpable anomalies       Provocative Tests: Hyperextension/rotation test: deferred today       Lumbar quadrant test (Kemp's test): deferred today       Lateral bending test: deferred today       Patrick's Maneuver: deferred today  FABER test: deferred today                   S-I anterior distraction/compression test: deferred today         S-I  lateral compression test: deferred today         S-I Thigh-thrust test: deferred today         S-I Gaenslen's test: deferred today          Gait & Posture Assessment  Ambulation: Patient ambulates using a cane Gait: Significantly limited. Dependent on assistive device to ambulate Posture: Antalgic   Lower Extremity Exam    Side: Right lower extremity  Side: Left lower extremity  Stability: No instability observed          Stability: No instability observed          Skin & Extremity Inspection: Skin color, temperature, and hair growth are WNL. No peripheral edema or cyanosis. No masses, redness, swelling, asymmetry, or associated skin lesions. No contractures.  Skin & Extremity Inspection: Skin color, temperature, and hair growth are WNL. No peripheral edema or cyanosis. No masses, redness, swelling, asymmetry, or associated skin lesions. No contractures.  Functional ROM: Unrestricted ROM                  Functional ROM: Unrestricted ROM                  Muscle Tone/Strength: Functionally intact. No obvious neuro-muscular anomalies detected.  Muscle Tone/Strength: Functionally intact. No obvious neuro-muscular anomalies detected.  Sensory (Neurological): Unimpaired medial portion of foot (L4)  Sensory (Neurological): Unimpaired medial portion of foot (L4)  DTR: Patellar: deferred today Achilles: deferred today Plantar: deferred today  DTR: Patellar: deferred today Achilles: deferred today Plantar: deferred today  Palpation: No palpable anomalies  Palpation: No palpable anomalies   Assessment  Primary Diagnosis & Pertinent Problem List: The primary encounter diagnosis was DDD (degenerative disc disease), lumbosacral. 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  Status Diagnosis  Controlled Controlled Controlled 1. DDD (degenerative disc disease), lumbosacral   2. Sacral insufficiency fracture   3. 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 Other specified dorsopathies, sacral and sacrococcygeal region   5. Lumbosacral L5-S1 subarticular lateral recess stenosis (Bilateral)   6. Chronic radicular pain of lower extremity   7. 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   8. History of allergy to latex   9. Chronic pain syndrome   10. Acute deep vein thrombosis (DVT) of distal end of lower extremity, unspecified laterality ([place])   11. Chronic anticoagulation (XARELTO)     Problems updated and reviewed during this visit: Problem  Ddd (Degenerative Disc Disease), Lumbosacral  Chronic anticoagulation (XARELTO)  Dvt, Lower Extremity, Distal, Acute ([facility])   Plan of Care  Pharmacotherapy (Medications Ordered): Meds ordered this encounter  Medications  . HYDROcodone-acetaminophen (NORCO/VICODIN) 5-325 MG tablet    Sig: Take 1 tablet by mouth every 8 (eight) hours as needed for up to 7 days for severe pain. Must last 30 days.    Dispense:  21 tablet  Refill:  0    [place] STOP ACT - Not applicable. Fill one day early if pharmacy is closed on scheduled refill date. Do not fill until: 04/11/18 . Must last 30 days. To last until: 04/18/18.  Marland Kitchen HYDROcodone-acetaminophen (NORCO/VICODIN) 5-325 MG tablet    Sig: Take 1 tablet by mouth every 8 (eight) hours as needed for severe pain. Must last 30 days.    Dispense:  90 tablet    Refill:  0    [place] STOP ACT - Not applicable. Fill one day early if pharmacy is closed on scheduled refill date. Do not fill until:  04/18/18. Must last 30 days. To last until: 05/18/18.   Medications administered today: [name] had no medications administered during this visit.  Procedure Orders    No procedure(s) ordered today   Lab Orders  No laboratory test(s) ordered today   Imaging Orders  No imaging studies ordered today   Referral Orders  No referral(s) requested today   Interventional management options: Planned, scheduled, and/or pending:   NOTE: NO RFA until BMI is <35. 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 Patient started on XARELTO. PRN: Therapeutic (Midline) caudal epidural steroid injection #2 under fluoroscopic guidance and IV sedation, for the sacral and tailbone pain.   Considering:   Diagnostic bilateral lumbar facet block #4 Possible bilateral lumbar facet RFA Therapeutic left-sided L4-5 interlaminar LESI #2   Diagnostic L5-S1 LESI Diagnostic bilateral L3transforaminal ESI Diagnostic bilateral L4transforaminal ESI Diagnostic bilateral L5 transforaminal ESI Diagnostic right-sided sacroiliac joint block Possible right-sided sacroiliac joint RFA Diagnostic bilateral lumbar facet block Possible bilateral lumbar facet RFA Diagnostic right-sided intra-articular hip joint injection Diagnostic right-sided femoral nerve +Obturatornerve block Possible right-sided femoral nerve + obturator RFA Diagnostic (Midline) caudal ESI #2    Palliative PRN treatment(s):   Palliative bilateral lumbar facet blocks Palliative/Therapeutic (Midline) caudal epidural steroid injection #2    Provider-requested follow-up: Return in about 1 month (around 05/11/2018) for Med-Mgmt, w/ Dr. Dossie Arbour.  No future appointments. Primary Care Physician: Tracie Harrier, MD Location: [facility] Note by: Gaspar Cola, MD Date: 04/11/2018; Time: 1:04 PM

## 2018-04-11 NOTE — Patient Instructions (Addendum)
____________________________________________________________________________________________  Medication Rules  Applies to: All patients receiving prescriptions (written or electronic).  Pharmacy of record: Pharmacy where electronic prescriptions will be sent. If written prescriptions are taken to a different pharmacy, please inform the nursing staff. The pharmacy listed in the electronic medical record should be the one where you would like electronic prescriptions to be sent.  Prescription refills: Only during scheduled appointments. Applies to both, written and electronic prescriptions.  NOTE: The following applies primarily to controlled substances (Opioid* Pain Medications).   Patient's responsibilities: 1. Pain Pills: Bring all pain pills to every appointment (except for procedure appointments). 2. Pill Bottles: Bring pills in original pharmacy bottle. Always bring newest bottle. Bring bottle, even if empty. 3. Medication refills: You are responsible for knowing and keeping track of what medications you need refilled. The day before your appointment, write a list of all prescriptions that need to be refilled. Bring that list to your appointment and give it to the admitting nurse. Prescriptions will be written only during appointments. If you forget a medication, it will not be "Called in", "Faxed", or "electronically sent". You will need to get another appointment to get these prescribed. 4. Prescription Accuracy: You are responsible for carefully inspecting your prescriptions before leaving our office. Have the discharge nurse carefully go over each prescription with you, before taking them home. Make sure that your name is accurately spelled, that your address is correct. Check the name and dose of your medication to make sure it is accurate. Check the number of pills, and the written instructions to make sure they are clear and accurate. Make sure that you are given enough medication to last  until your next medication refill appointment. 5. Taking Medication: Take medication as prescribed. Never take more pills than instructed. Never take medication more frequently than prescribed. Taking less pills or less frequently is permitted and encouraged, when it comes to controlled substances (written prescriptions).  6. Inform other Doctors: Always inform, all of your healthcare providers, of all the medications you take. 7. Pain Medication from other Providers: You are not allowed to accept any additional pain medication from any other Doctor or Healthcare provider. There are two exceptions to this rule. (see below) In the event that you require additional pain medication, you are responsible for notifying us, as stated below. 8. Medication Agreement: You are responsible for carefully reading and following our Medication Agreement. This must be signed before receiving any prescriptions from our practice. Safely store a copy of your signed Agreement. Violations to the Agreement will result in no further prescriptions. (Additional copies of our Medication Agreement are available upon request.) 9. Laws, Rules, & Regulations: All patients are expected to follow all Federal and State Laws, Statutes, Rules, & Regulations. Ignorance of the Laws does not constitute a valid excuse. The use of any illegal substances is prohibited. 10. Adopted CDC guidelines & recommendations: Target dosing levels will be at or below 60 MME/day. Use of benzodiazepines** is not recommended.  Exceptions: There are only two exceptions to the rule of not receiving pain medications from other Healthcare Providers. 1. Exception #1 (Emergencies): In the event of an emergency (i.e.: accident requiring emergency care), you are allowed to receive additional pain medication. However, you are responsible for: As soon as you are able, call our office [phone], at any time of the day or night, and leave a message stating your name, the  date and nature of the emergency, and the name and dose of the medication   prescribed. In the event that your call is answered by a member of our staff, make sure to document and save the date, time, and the name of the person that took your information.  2. Exception #2 (Planned Surgery): In the event that you are scheduled by another doctor or dentist to have any type of surgery or procedure, you are allowed (for a period no longer than 30 days), to receive additional pain medication, for the acute post-op pain. However, in this case, you are responsible for picking up a copy of our "Post-op Pain Management for Surgeons" handout, and giving it to your surgeon or dentist. This document is available at our office, and does not require an appointment to obtain it. Simply go to our office during business hours (Monday-Thursday from 8:00 AM to 4:00 PM) (Friday 8:00 AM to 12:00 Noon) or if you have a scheduled appointment with us, prior to your surgery, and ask for it by name. In addition, you will need to provide us with your name, name of your surgeon, type of surgery, and date of procedure or surgery.  *Opioid medications include: morphine, codeine, oxycodone, oxymorphone, hydrocodone, hydromorphone, meperidine, tramadol, tapentadol, buprenorphine, fentanyl, methadone. **Benzodiazepine medications include: diazepam (Valium), alprazolam (Xanax), clonazepam (Klonopine), lorazepam (Ativan), clorazepate (Tranxene), chlordiazepoxide (Librium), estazolam (Prosom), oxazepam (Serax), temazepam (Restoril), triazolam (Halcion) (Last updated: 07/21/2017) ____________________________________________________________________________________________   ____________________________________________________________________________________________  Medication Recommendations and Reminders  Applies to: All patients receiving prescriptions (written and/or electronic).  Medication Rules & Regulations: These rules and  regulations exist for your safety and that of others. They are not flexible and neither are we. Dismissing or ignoring them will be considered "non-compliance" with medication therapy, resulting in complete and irreversible termination of such therapy. (See document titled "Medication Rules" for more details.) In all conscience, because of safety reasons, we cannot continue providing a therapy where the patient does not follow instructions.  Pharmacy of record:   Definition: This is the pharmacy where your electronic prescriptions will be sent.   We do not endorse any particular pharmacy.  You are not restricted in your choice of pharmacy.  The pharmacy listed in the electronic medical record should be the one where you want electronic prescriptions to be sent.  If you choose to change pharmacy, simply notify our nursing staff of your choice of new pharmacy.  Recommendations:  Keep all of your pain medications in a safe place, under lock and key, even if you live alone.   After you fill your prescription, take 1 week's worth of pills and put them away in a safe place. You should keep a separate, properly labeled bottle for this purpose. The remainder should be kept in the original bottle. Use this as your primary supply, until it runs out. Once it's gone, then you know that you have 1 week's worth of medicine, and it is time to come in for a prescription refill. If you do this correctly, it is unlikely that you will ever run out of medicine.  To make sure that the above recommendation works, it is very important that you make sure your medication refill appointments are scheduled at least 1 week before you run out of medicine. To do this in an effective manner, make sure that you do not leave the office without scheduling your next medication management appointment. Always ask the nursing staff to show you in your prescription , when your medication will be running out. Then arrange for the  receptionist to get you a return   appointment, at least 7 days before you run out of medicine. Do not wait until you have 1 or 2 pills left, to come in. This is very poor planning and does not take into consideration that we may need to cancel appointments due to bad weather, sickness, or emergencies affecting our staff.  "Partial Fill": If for any reason your pharmacy does not have enough pills/tablets to completely fill or refill your prescription, do not allow for a "partial fill". You will need a separate prescription to fill the remaining amount, which we will not provide. If the reason for the partial fill is your insurance, you will need to talk to the pharmacist about payment alternatives for the remaining tablets, but again, do not accept a partial fill.  Prescription refills and/or changes in medication(s):   Prescription refills, and/or changes in dose or medication, will be conducted only during scheduled medication management appointments. (Applies to both, written and electronic prescriptions.)  No refills on procedure days. No medication will be changed or started on procedure days. No changes, adjustments, and/or refills will be conducted on a procedure day. Doing so will interfere with the diagnostic portion of the procedure.  No phone refills. No medications will be "called into the pharmacy".  No Fax refills.  No weekend refills.  No Holliday refills.  No after hours refills.  Remember:  Business hours are:  Monday to Thursday 8:00 AM to 4:00 PM Provider's Schedule: Crystal King, NP - Appointments are:  Medication management: Monday to Thursday 8:00 AM to 4:00 PM Rexine Gowens, MD - Appointments are:  Medication management: Monday and Wednesday 8:00 AM to 4:00 PM Procedure day: Tuesday and Thursday 7:30 AM to 4:00 PM Bilal Lateef, MD - Appointments are:  Medication management: Tuesday and Thursday 8:00 AM to 4:00 PM Procedure day: Monday and Wednesday 7:30 AM to  4:00 PM (Last update: 07/21/2017) ____________________________________________________________________________________________   ____________________________________________________________________________________________  CANNABIDIOL (AKA: CBD Oil or Pills)  Applies to: All patients receiving prescriptions of controlled substances (written and/or electronic).  General Information: Cannabidiol (CBD) was discovered in 1940. It is one of some 113 identified cannabinoids in cannabis (Marijuana) plants, accounting for up to 40% of the plant's extract. As of 2018, preliminary clinical research on cannabidiol included studies of anxiety, cognition, movement disorders, and pain.  Cannabidiol is consummed in multiple ways, including inhalation of cannabis smoke or vapor, as an aerosol spray into the cheek, and by mouth. It may be supplied as CBD oil containing CBD as the active ingredient (no added tetrahydrocannabinol (THC) or terpenes), a full-plant CBD-dominant hemp extract oil, capsules, dried cannabis, or as a liquid solution. CBD is thought not have the same psychoactivity as THC, and may affect the actions of THC. Studies suggest that CBD may interact with different biological targets, including cannabinoid receptors and other neurotransmitter receptors. As of 2018 the mechanism of action for its biological effects has not been determined.  In the [place], cannabidiol has a limited approval by the Food and Drug Administration (FDA) for treatment of only two types of epilepsy disorders. The side effects of long-term use of the drug include somnolence, decreased appetite, diarrhea, fatigue, malaise, weakness, sleeping problems, and others.  CBD remains a Schedule I drug prohibited for any use.  Legality: Some manufacturers ship CBD products nationally, an illegal action which the FDA has not enforced in 2018, with CBD remaining the subject of an FDA investigational new drug evaluation, and is  not considered legal as a dietary supplement or   food ingredient as of December 2018. Federal illegality has made it difficult historically to conduct research on CBD. CBD is openly sold in head shops and health food stores in some states where such sales have not been explicitly legalized.  Warning: Because it is not FDA approved for general use or treatment of pain, it is not required to undergo the same manufacturing controls as prescription drugs.  This means that the available cannabidiol (CBD) may be contaminated with THC.  If this is the case, it will trigger a positive urine drug screen (UDS) test for cannabinoids (Marijuana).  Because a positive UDS for illicit substances is a violation of our medication agreement, your opioid analgesics (pain medicine) may be permanently discontinued. (Last update: 08/11/2017) ____________________________________________________________________________________________

## 2018-04-12 ENCOUNTER — Telehealth: Payer: Self-pay

## 2018-04-12 NOTE — Telephone Encounter (Signed)
Patient states she is doing well. Did not do the procedure related to blood clot. Patient states she will call back to reschd.

## 2018-04-24 DIAGNOSIS — S00431A Contusion of right ear, initial encounter: Secondary | ICD-10-CM | POA: Diagnosis not present

## 2018-04-25 ENCOUNTER — Telehealth: Payer: Self-pay | Admitting: Pain Medicine

## 2018-04-25 NOTE — Telephone Encounter (Signed)
Recently diagnosed with blood clot in leg, started taking Xarelto. Has not yet followed up after initial diagnosis and beginning Xarelto. Patient instructed to follow-up, speak with treating physician about stopping Xarelto and getting a procedure, and call us after that appointment.

## 2018-04-25 NOTE — Telephone Encounter (Signed)
Can someone ask Dr Lowella Dandy about this patient, I am not with him today. Thanks!!

## 2018-04-25 NOTE — Telephone Encounter (Signed)
Patient would like to have a procedure. Her leg is not swollen now. She is taking xarelto, has been on 15 days. She is in quite a bit of pain, meds do not seem to be keeping in control.  Please call patient and let her know what Dr. Dossie Arbour recommends.

## 2018-05-02 ENCOUNTER — Telehealth: Payer: Self-pay | Admitting: Pain Medicine

## 2018-05-02 DIAGNOSIS — I824Y1 Acute embolism and thrombosis of unspecified deep veins of right proximal lower extremity: Secondary | ICD-10-CM | POA: Diagnosis not present

## 2018-05-02 DIAGNOSIS — I10 Essential (primary) hypertension: Secondary | ICD-10-CM | POA: Diagnosis not present

## 2018-05-02 DIAGNOSIS — E1165 Type 2 diabetes mellitus with hyperglycemia: Secondary | ICD-10-CM | POA: Diagnosis not present

## 2018-05-02 DIAGNOSIS — Z9889 Other specified postprocedural states: Secondary | ICD-10-CM | POA: Diagnosis not present

## 2018-05-02 DIAGNOSIS — G8929 Other chronic pain: Secondary | ICD-10-CM | POA: Diagnosis not present

## 2018-05-02 DIAGNOSIS — M5136 Other intervertebral disc degeneration, lumbar region: Secondary | ICD-10-CM | POA: Diagnosis not present

## 2018-05-02 DIAGNOSIS — M5440 Lumbago with sciatica, unspecified side: Secondary | ICD-10-CM | POA: Diagnosis not present

## 2018-05-02 NOTE — Telephone Encounter (Signed)
Patient went to see Dr. Ginette Pitman, it is ok for her to stop blood thinner today, she can have procedure after the 17th. Patient will call Dr. Ginette Pitman to send medical clearance for her to stop med and have procedure. I scheduled patient for Jan 7 at 8am, she states she cannot wait that long. There are no available slots for procedure on 12-17 or 12-19. She says her back is in knots and she needs procedure asap. Please ask Dr. Dossie Arbour if she can be added to 12-19 first thing that morning.

## 2018-05-02 NOTE — Telephone Encounter (Signed)
Spoke with Jenny Reichmann, Dr Linton Ham office with patient history of DVT and desire to have procedure for back pain.  Patient was told that she would need to have clearance from Dr Ginette Pitman to stop blood thinner prior to having procedure.  Patient states that Dr Ginette Pitman has said she could stop the blood thinner but we do not have any written documentation to confirm this.  Jenny Reichmann will put this message back to Dr Linton Ham nurse to see if he will give clearance to stop blood thinner.

## 2018-05-04 ENCOUNTER — Telehealth: Payer: Self-pay | Admitting: *Deleted

## 2018-05-04 ENCOUNTER — Telehealth: Payer: Self-pay | Admitting: Pain Medicine

## 2018-05-04 DIAGNOSIS — H6001 Abscess of right external ear: Secondary | ICD-10-CM | POA: Diagnosis not present

## 2018-05-04 DIAGNOSIS — H9211 Otorrhea, right ear: Secondary | ICD-10-CM | POA: Diagnosis not present

## 2018-05-04 NOTE — Telephone Encounter (Signed)
We have not yet received permission from Dr. Ginette Pitman to stop blood thinner. She should keep MR appointment, as we are not sure if the procedure will happen on 05-09-18.

## 2018-05-04 NOTE — Telephone Encounter (Signed)
Procedure scheduled 05/09/18,  Medical clearance received from DR Hande's office.

## 2018-05-04 NOTE — Telephone Encounter (Signed)
Patient called asking if she can get her procedure,  she is scheduled 05-30-18 for procedure with Dr. Dossie Arbour. Patient states she cannot wait that long. Dr. Dossie Arbour has opening on Tues 05-09-18 but she also has med refill appt on 16th. We have been told not to schedule procedure and med refill that close. Please ask Dr. Dossie Arbour if patient can have meds on Monday and procedure on Tuesday

## 2018-05-08 ENCOUNTER — Encounter: Payer: PPO | Admitting: Pain Medicine

## 2018-05-08 DIAGNOSIS — I824Z1 Acute embolism and thrombosis of unspecified deep veins of right distal lower extremity: Secondary | ICD-10-CM | POA: Insufficient documentation

## 2018-05-08 NOTE — Progress Notes (Deleted)
Patient's Name: Kara [name]  MRN: [id number]  Referring Provider: Tracie Harrier, MD  DOB: 1950/12/20  PCP: Tracie Harrier, MD  DOS: 05/08/2018  Note by: Gaspar Cola, MD  Service setting: Ambulatory outpatient  Specialty: Interventional Pain Management  Location: [name] (AMB) Pain Management Facility    Patient type: Established   Primary Reason(s) for Visit: Encounter for prescription drug management. (Level of risk: moderate)  CC: No chief complaint on file.  HPI  Kara [name] is a 67 y.o. year old, female patient, who comes today for a medication management evaluation. She has Diabetes mellitus type 2 in obese ([place]); Type 2 diabetes mellitus without complication, without long-term current use of insulin ([place]); BP (high blood pressure); Nocturnal leg cramps; Cough; Chronic hip pain (Left); Acute non-recurrent maxillary sinusitis; Diverticulitis large intestine; Diverticulitis; DDD (degenerative disc disease), lumbar; Benign essential hypertension; Edema of both legs; Acute on chronic systolic (congestive) heart failure ([place]); HCAP (healthcare-associated pneumonia); Encounter for removal of staples; Other pulmonary embolism without acute cor pulmonale ([place]); History of colostomy reversal; Ileostomy in place [facility]); Positive result for methicillin resistant Staphylococcus aureus (MRSA) screening; H/O ileostomy; Diverticulosis of colon; History of pulmonary embolism; S/P IVC filter; Chronic low back pain (Primary Area of Pain) (Bilateral) w/ sciatica (Bilateral); Chronic sacroiliac joint pain (Right); Chronic pain syndrome; Long term current use of opiate analgesic; Pharmacologic therapy; Disorder of skeletal system; Problems influencing health status; Hyponatremia; Hypocalcemia; 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-S1 anterolisthesis.; Lumbar foraminal stenosis (L3-4 and L4-5) (Bilateral); Lumbosacral L5-S1 subarticular lateral recess stenosis (Bilateral); Lumbar facet arthropathy  (L3-4, L4-5, and L5-S1) (Bilateral); Lumbar facet syndrome (Bilateral); 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); Lumbar pars defect (L3 and L4) (Bilateral); Diabetic peripheral neuropathy ([place]); Chronic lower extremity pain (Secondary Area of Pain) (Bilateral); Chronic radicular pain of lower extremity; Osteoarthritis of hip (Right); History of allergy to latex; Chronic low back pain (Primary Area of Pain) (Bilateral); Spondylosis without myelopathy or radiculopathy, lumbosacral region; Other specified dorsopathies, sacral and sacrococcygeal region; Morbid obesity with BMI of 45.0-49.9, adult ([place]); Secondary osteoarthritis of multiple sites; Sacral insufficiency fracture; DDD (degenerative disc disease), lumbosacral; Chronic anticoagulation (XARELTO); and Acute deep vein thrombosis (DVT) of distal end of right lower extremity ([place]) on their problem list. Her primarily concern today is the No chief complaint on file.  Pain Assessment: Location:     Radiating:   Onset:   Duration:   Quality:   Severity:  /10 (subjective, self-reported pain score)  Note: Reported level is compatible with observation.                         When using our objective Pain Scale, levels between 6 and 10/10 are said to belong in an emergency room, as it progressively worsens from a 6/10, described as severely limiting, requiring emergency care not usually available at an outpatient pain management facility. At a 6/10 level, communication becomes difficult and requires great effort. Assistance to reach the emergency department may be required. Facial flushing and profuse sweating along with potentially dangerous increases in heart rate and blood pressure will be evident. Effect on ADL:   Timing:   Modifying factors:   BP:    HR:    Kara [name] was last scheduled for an appointment on 66/04/2018 for medication management. During today's appointment we reviewed Kara [name] chronic pain  status, as well as her outpatient medication regimen.   Previously, the patient had come in for a caudal epidural  steroid injection, but just the day before she had been diagnosed with a DVT and was started on anticoagulation.  Because of this, we decided to cancel the procedure until this acute thrombotic situation could get treated and under control.  To assist with the patient's pain I started her on hydrocodone, which she indicated that had worked for her in the past without giving her any side effects or problems.  Today she comes in for her first refill on this medication.  The current plan is to avoid any type of interventional procedures that would cause vasodilation of the lower extremities allowing for the blood clot to be released.  We will wait until we get clearance from her PCP to restart therapy.  The patient  reports no history of drug use. Her body mass index is unknown because there is no height or weight on file.  Further details on both, my assessment(s), as well as the proposed treatment plan, please see below.  Controlled Substance Pharmacotherapy Assessment REMS (Risk Evaluation and Mitigation Strategy)  Analgesic: Hydrocodone/APAP 5/325 1 tablet every 8 hours (15 mg/day of hydrocodone) MME/day: 15 mg/day.  No notes on file Pharmacokinetics: Liberation and absorption (onset of action): WNL Distribution (time to peak effect): WNL Metabolism and excretion (duration of action): WNL         Pharmacodynamics: Desired effects: Analgesia: Kara [name] reports >50% benefit. Functional ability: Patient reports that medication allows her to accomplish basic ADLs Clinically meaningful improvement in function (CMIF): Sustained CMIF goals met Perceived effectiveness: Described as relatively effective, allowing for increase in activities of daily living (ADL) Undesirable effects: Side-effects or Adverse reactions: None reported Monitoring: [place] PMP: Online review of the past  9-monthperiod conducted. Compliant with practice rules and regulations Last UDS on record: No results found for: SUMMARY UDS interpretation: Compliant          Medication Assessment Form: Reviewed. Patient indicates being compliant with therapy Treatment compliance: Compliant Risk Assessment Profile: Aberrant behavior: See prior evaluations. None observed or detected today Comorbid factors increasing risk of overdose: See prior notes. No additional risks detected today Opioid risk tool (ORT) (Total Score):   Personal History of Substance Abuse (SUD-Substance use disorder):  Alcohol:    Illegal Drugs:    Rx Drugs:    ORT Risk Level calculation:   Risk of substance use disorder (SUD): Low  ORT Scoring interpretation table:  Score <3 = Low Risk for SUD  Score between 4-7 = Moderate Risk for SUD  Score >8 = High Risk for Opioid Abuse   Risk Mitigation Strategies:  Patient Counseling: Covered Patient-Prescriber Agreement (PPA): Present and active  Notification to other healthcare providers: Done  Pharmacologic Plan: No change in therapy, at this time.             Laboratory Chemistry  Inflammation Markers (CRP: Acute Phase) (ESR: Chronic Phase) Lab Results  Component Value Date   CRP 5.3 (H) 11/02/2017   ESRSEDRATE 12 11/02/2017                         Rheumatology Markers No results found.  Renal Function Markers Lab Results  Component Value Date   BUN 19 10/03/2017   CREATININE 0.63 10/03/2017   GFRAA >60 10/03/2017   GFRNONAA >60 10/03/2017                             Hepatic Function Markers Lab Results  Component Value Date   AST 40 04/21/2017   ALT 53 04/21/2017   ALBUMIN 3.8 04/21/2017   ALKPHOS 88 04/21/2017   LIPASE 22 10/31/2016                        Electrolytes Lab Results  Component Value Date   NA 134 (L) 10/03/2017   K 4.0 10/03/2017   CL 104 10/03/2017   CALCIUM 8.3 (L) 10/03/2017   MG 2.0 11/02/2017   PHOS 3.3 11/07/2016                         Neuropathy Markers Lab Results  Component Value Date   XBDZHGDJ24 268 11/02/2017   HGBA1C 7.2 (H) 02/17/2017   HIV Non Reactive 11/18/2016                        CNS Tests No results found.  Bone Pathology Markers Lab Results  Component Value Date   25OHVITD1 25 (L) 11/02/2017   25OHVITD2 <1.0 11/02/2017   25OHVITD3 25 11/02/2017                         Coagulation Parameters Lab Results  Component Value Date   PLT 200 10/03/2017                        Cardiovascular Markers Lab Results  Component Value Date   BNP 60.0 11/17/2016   CKTOTAL 71 10/02/2011   CKMB < 0.5 (L) 10/02/2011   TROPONINI <0.03 11/17/2016   HGB 16.5 (H) 10/03/2017   HCT 49.7 (H) 10/03/2017                         CA Markers No results found.  Note: Lab results reviewed.  Recent Diagnostic Imaging Results  US Venous Img Lower Unilateral Right CLINICAL DATA:  Swelling and pain x4 days  EXAM: RIGHT LOWER EXTREMITY VENOUS DOPPLER ULTRASOUND  TECHNIQUE: Gray-scale sonography with compression, as well as color and duplex ultrasound, were performed to evaluate the deep venous system from the level of the common femoral vein through the popliteal and proximal calf veins.  COMPARISON:  None  FINDINGS: Normal compressibility of the common femoral vein, saphenofemoral junction, deep femoral vein, and proximal and mid femoral vein.  There is incomplete compressibility of the distal femoral vein and proximal popliteal vein, with continued antegrade flow noted on color Doppler. There is occlusive thrombus in the distal popliteal vein and posterior tibial vein. Limited evaluation of the peroneal vein.  Survey views of the contralateral common femoral vein are unremarkable.  IMPRESSION: 1. POSITIVE for calf and femoropopliteal acute DVT.  Electronically Signed   By: Lucrezia Europe M.D.   On: 04/10/2018 16:06  Complexity Note: Imaging results reviewed. Results shared with Ms.  [name], using Layman's terms.                         Meds   Current Outpatient Medications:  .  aspirin EC 81 MG tablet, Take 81 mg by mouth daily., Disp: , Rfl:  .  candesartan (ATACAND) 16 MG tablet, Take 8 mg by mouth daily. , Disp: , Rfl:  .  cyclobenzaprine (FLEXERIL) 10 MG tablet, Take 10 mg by mouth at bedtime. , Disp: , Rfl:  .  gabapentin (NEURONTIN) 100 MG  capsule, Take 200 mg by mouth 3 (three) times daily. , Disp: , Rfl:  .  glipiZIDE (GLUCOTROL XL) 5 MG 24 hr tablet, Take 5 mg by mouth daily with breakfast. , Disp: , Rfl:  .  HYDROcodone-acetaminophen (NORCO/VICODIN) 5-325 MG tablet, Take 1 tablet by mouth every 8 (eight) hours as needed for up to 7 days for severe pain. Must last 30 days., Disp: 21 tablet, Rfl: 0 .  HYDROcodone-acetaminophen (NORCO/VICODIN) 5-325 MG tablet, Take 1 tablet by mouth every 8 (eight) hours as needed for severe pain. Must last 30 days., Disp: 90 tablet, Rfl: 0 .  meclizine (ANTIVERT) 25 MG tablet, , Disp: , Rfl:  .  Multiple Vitamin (MULTI-VITAMINS) TABS, Take 1 tablet by mouth daily., Disp: , Rfl:  .  Rivaroxaban 15 & 20 MG TBPK, Take as directed: Start with one 40m tablet by mouth twice a day with food. On Day 22, switch to one 271mtablet once a day with food., Disp: 51 each, Rfl: 0 .  rOPINIRole (REQUIP) 0.5 MG tablet, Take 0.5 mg by mouth at bedtime., Disp: , Rfl:   ROS  Constitutional: Denies any fever or chills Gastrointestinal: No reported hemesis, hematochezia, vomiting, or acute GI distress Musculoskeletal: Denies any acute onset joint swelling, redness, loss of ROM, or weakness Neurological: No reported episodes of acute onset apraxia, aphasia, dysarthria, agnosia, amnesia, paralysis, loss of coordination, or loss of consciousness  Allergies  Ms. JaCibrians allergic to adhesive [tape]; morphine; other; latex; and morphine and related.  PFSH  Drug: Ms. JaLabreckreports no history of drug use. Alcohol:   reports no history of alcohol use. Tobacco:  reports that she has never smoked. She has never used smokeless tobacco. Medical:  has a past medical history of Anginal pain ([place] Arthritis, CHF (congestive heart failure) ([place] Diabetes (HCPatmos Diverticulitis, Hypertension, PE (pulmonary thromboembolism) (HCW[place] and 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HCHockess[place] Surgical: Ms. JaOuderkirkhas a past surgical history that includes Knee surgery; Carpal tunnel release (Bilateral); Colon resection sigmoid (N/A, 11/02/2016); Colostomy (N/A, 11/02/2016); Sigmoidoscopy (N/A, 11/13/2016); PULMONARY VENOGRAPHY (N/A, 11/19/2016); Colonoscopy with propofol (N/A, 02/03/2017); Colon surgery (11/02/2016); IVC FILTER INSERTION (Right, 10/2016); Colostomy reversal (N/A, 02/24/2017); Appendectomy (N/A, 02/24/2017); Lysis of adhesion (N/A, 02/24/2017); laparoscopy (N/A, 02/24/2017); Ileo loop diversion (N/A, 02/24/2017); Sigmoidoscopy (N/A, 05/19/2017); Ileostomy closure (N/A, 06/01/2017); IVC FILTER REMOVAL (N/A, 08/09/2017); and Sacroplasty (N/A, 11/08/2017). Family: family history includes Breast cancer in her cousin and mother; Cancer in her mother; Diabetes in her mother and sister; Heart disease in her father, mother, and sister.  Constitutional Exam  General appearance: Well nourished, well developed, and well hydrated. In no apparent acute distress There were no vitals filed for this visit. BMI Assessment: Estimated body mass index is 39.15 kg/m as calculated from the following:   Height as of 04/11/18: '5\' 3"'  (1.6 m).   Weight as of 04/11/18: 221 lb (100.2 kg).  BMI interpretation table: BMI level Category Range association with higher incidence of chronic pain  <18 kg/m2 Underweight   18.5-24.9 kg/m2 Ideal body weight   25-29.9 kg/m2 Overweight Increased incidence by 20%  30-34.9 kg/m2 Obese (Class I) Increased incidence by 68%  35-39.9 kg/m2 Severe obesity (Class II) Increased incidence by 136%  >40 kg/m2 Extreme obesity  (Class III) Increased incidence by 254%   Patient's current BMI Ideal Body weight  There is no height or weight on file to calculate BMI. Patient weight not recorded   BMI Readings from Last 4 Encounters:  04/11/18 39.15 kg/m  04/10/18 39.15 kg/m  03/29/18 39.33 kg/m  03/16/18 38.62 kg/m   Wt Readings from Last 4 Encounters:  04/11/18 221 lb (100.2 kg)  04/10/18 221 lb (100.2 kg)  03/29/18 222 lb (100.7 kg)  03/16/18 218 lb (98.9 kg)  Psych/Mental status: Alert, oriented x 3 (person, place, & time)       Eyes: PERLA Respiratory: No evidence of acute respiratory distress  Cervical Spine Area Exam  Skin & Axial Inspection: No masses, redness, edema, swelling, or associated skin lesions Alignment: Symmetrical Functional ROM: Unrestricted ROM      Stability: No instability detected Muscle Tone/Strength: Functionally intact. No obvious neuro-muscular anomalies detected. Sensory (Neurological): Unimpaired Palpation: No palpable anomalies              Upper Extremity (UE) Exam    Side: Right upper extremity  Side: Left upper extremity  Skin & Extremity Inspection: Skin color, temperature, and hair growth are WNL. No peripheral edema or cyanosis. No masses, redness, swelling, asymmetry, or associated skin lesions. No contractures.  Skin & Extremity Inspection: Skin color, temperature, and hair growth are WNL. No peripheral edema or cyanosis. No masses, redness, swelling, asymmetry, or associated skin lesions. No contractures.  Functional ROM: Unrestricted ROM          Functional ROM: Unrestricted ROM          Muscle Tone/Strength: Functionally intact. No obvious neuro-muscular anomalies detected.  Muscle Tone/Strength: Functionally intact. No obvious neuro-muscular anomalies detected.  Sensory (Neurological): Unimpaired          Sensory (Neurological): Unimpaired          Palpation: No palpable anomalies              Palpation: No palpable anomalies              Provocative Test(s):   Phalen's test: deferred Tinel's test: deferred Apley's scratch test (touch opposite shoulder):  Action 1 (Across chest): deferred Action 2 (Overhead): deferred Action 3 (LB reach): deferred   Provocative Test(s):  Phalen's test: deferred Tinel's test: deferred Apley's scratch test (touch opposite shoulder):  Action 1 (Across chest): deferred Action 2 (Overhead): deferred Action 3 (LB reach): deferred    Thoracic Spine Area Exam  Skin & Axial Inspection: No masses, redness, or swelling Alignment: Symmetrical Functional ROM: Unrestricted ROM Stability: No instability detected Muscle Tone/Strength: Functionally intact. No obvious neuro-muscular anomalies detected. Sensory (Neurological): Unimpaired Muscle strength & Tone: No palpable anomalies  Lumbar Spine Area Exam  Skin & Axial Inspection: No masses, redness, or swelling Alignment: Symmetrical Functional ROM: Unrestricted ROM       Stability: No instability detected Muscle Tone/Strength: Functionally intact. No obvious neuro-muscular anomalies detected. Sensory (Neurological): Unimpaired Palpation: No palpable anomalies       Provocative Tests: Hyperextension/rotation test: deferred today       Lumbar quadrant test (Kemp's test): deferred today       Lateral bending test: deferred today       Patrick's Maneuver: deferred today                   FABER* test: deferred today                   S-I anterior distraction/compression test: deferred today         S-I lateral compression test: deferred today         S-I Thigh-thrust test: deferred today         S-I Gaenslen's test: deferred today         *(  Flexion, ABduction and External Rotation)  Gait & Posture Assessment  Ambulation: Unassisted Gait: Relatively normal for age and body habitus Posture: WNL   Lower Extremity Exam    Side: Right lower extremity  Side: Left lower extremity  Stability: No instability observed          Stability: No instability observed           Skin & Extremity Inspection: Skin color, temperature, and hair growth are WNL. No peripheral edema or cyanosis. No masses, redness, swelling, asymmetry, or associated skin lesions. No contractures.  Skin & Extremity Inspection: Skin color, temperature, and hair growth are WNL. No peripheral edema or cyanosis. No masses, redness, swelling, asymmetry, or associated skin lesions. No contractures.  Functional ROM: Unrestricted ROM                  Functional ROM: Unrestricted ROM                  Muscle Tone/Strength: Functionally intact. No obvious neuro-muscular anomalies detected.  Muscle Tone/Strength: Functionally intact. No obvious neuro-muscular anomalies detected.  Sensory (Neurological): Unimpaired        Sensory (Neurological): Unimpaired        DTR: Patellar: deferred today Achilles: deferred today Plantar: deferred today  DTR: Patellar: deferred today Achilles: deferred today Plantar: deferred today  Palpation: No palpable anomalies  Palpation: No palpable anomalies   Assessment  Primary Diagnosis & Pertinent Problem List: The primary encounter diagnosis was Chronic low back pain (Primary Area of Pain) (Bilateral) w/ sciatica (Bilateral). 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  Status Diagnosis   Controlled Controlled Controlled 1. Chronic low back pain (Primary Area of Pain) (Bilateral) w/ sciatica (Bilateral)   2. Chronic lower extremity pain (Secondary Area of Pain) (Bilateral)   3. Chronic radicular pain of lower extremity   4. Diabetic peripheral neuropathy ([place])   5. Lumbar facet syndrome (Bilateral)   6. Lumbar facet arthropathy (L3-4, L4-5, and L5-S1) (Bilateral)   7. Lumbar foraminal stenosis (L3-4 and L4-5) (Bilateral)   8. 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 Lumbosacral L5-S1 subarticular lateral recess stenosis (Bilateral)   10. 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 Sacral insufficiency fracture   12. Morbid obesity with BMI of 45.0-49.9, adult ([place])   13. Chronic anticoagulation (XARELTO)   14. Chronic pain syndrome   15. Acute deep vein thrombosis (DVT) of distal end of right lower extremity ([place])     Problems updated and reviewed during this visit: Problem  Acute Deep Vein Thrombosis (Dvt) of Distal End of Right Lower Extremity (Hcc)   Plan of Care  Pharmacotherapy (Medications Ordered): No orders of the defined types were placed in this encounter.  Medications administered today: Kara [name] had no medications administered during this visit.  Procedure Orders    No procedure(s) ordered today   Lab Orders  No laboratory test(s) ordered today  Imaging Orders  No imaging studies ordered today   Referral Orders  No referral(s) requested today   Interventional management options: Planned, scheduled, and/or pending:   NOTE:NO RFAuntil BMI is <35. 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 Patient started on XARELTO. We'll order a lower extremity US around February, 2020 to check if DVT has cleared. Once cleared, we will resume interventional therapy.  YQM:VHQIONGEXBM (Midline) caudal epidural steroid injection #2under fluoroscopic guidance and IV sedation, for the sacral and  tailbone pain.   Considering:   Diagnostic bilateral lumbar facet block#4 Possible bilateral lumbar facet RFA Therapeutic left-sided L4-5 interlaminar LESI #2 Diagnostic L5-S1 LESI Diagnostic bilateral L3transforaminal ESI Diagnostic bilateral L4transforaminal ESI Diagnostic bilateral L5 transforaminal ESI Diagnostic right-sided sacroiliac joint block Possible right-sided sacroiliac joint RFA Diagnostic bilateral lumbar facet block Possible bilateral lumbar facet RFA Diagnostic right-sided intra-articular hip joint injection Diagnostic right-sided femoral nerve +Obturatornerve block Possible right-sided femoral nerve + obturator RFA Diagnostic (Midline) caudal ESI #2   Palliative PRN treatment(s):   Palliativebilateral lumbar facet blocks Palliative/Therapeutic (Midline) caudal epidural steroid injection #2   Provider-requested follow-up: No follow-ups on file.  Future Appointments  Date Time Provider [place]  05/08/2018 11:45 AM Milinda Pointer, MD [name] None  05/09/2018 11:15 AM Milinda Pointer, MD [facility] None   Primary Care Physician: Tracie Harrier, MD Location: [facility] Outpatient Pain Management Facility Note by: Gaspar Cola, MD Date: 05/08/2018; Time: 6:07 AM

## 2018-05-09 ENCOUNTER — Encounter: Payer: Self-pay | Admitting: Pain Medicine

## 2018-05-09 ENCOUNTER — Ambulatory Visit
Admission: RE | Admit: 2018-05-09 | Discharge: 2018-05-09 | Disposition: A | Discharge status: Home / Self Care | Payer: PPO | Source: Ambulatory Visit | Attending: Pain Medicine | Admitting: Pain Medicine

## 2018-05-09 ENCOUNTER — Ambulatory Visit (HOSPITAL_BASED_OUTPATIENT_CLINIC_OR_DEPARTMENT_OTHER): Payer: PPO | Admitting: Pain Medicine

## 2018-05-09 ENCOUNTER — Other Ambulatory Visit: Payer: Self-pay

## 2018-05-09 VITALS — BP 102/68 | HR 92 | Temp 97.6°F | Resp 21 | Ht 63.0 in | Wt 218.0 lb

## 2018-05-09 DIAGNOSIS — M5441 Lumbago with sciatica, right side: Secondary | ICD-10-CM | POA: Diagnosis not present

## 2018-05-09 DIAGNOSIS — M79604 Pain in right leg: Secondary | ICD-10-CM | POA: Insufficient documentation

## 2018-05-09 DIAGNOSIS — M5137 Other intervertebral disc degeneration, lumbosacral region: Secondary | ICD-10-CM | POA: Diagnosis not present

## 2018-05-09 DIAGNOSIS — M8448XS Pathological fracture, other site, sequela: Secondary | ICD-10-CM | POA: Diagnosis not present

## 2018-05-09 DIAGNOSIS — M79605 Pain in left leg: Secondary | ICD-10-CM | POA: Diagnosis not present

## 2018-05-09 DIAGNOSIS — M533 Sacrococcygeal disorders, not elsewhere classified: Secondary | ICD-10-CM | POA: Insufficient documentation

## 2018-05-09 DIAGNOSIS — Z91040 Latex allergy status: Secondary | ICD-10-CM | POA: Insufficient documentation

## 2018-05-09 DIAGNOSIS — M5442 Lumbago with sciatica, left side: Secondary | ICD-10-CM | POA: Insufficient documentation

## 2018-05-09 DIAGNOSIS — G8929 Other chronic pain: Secondary | ICD-10-CM

## 2018-05-09 DIAGNOSIS — M4807 Spinal stenosis, lumbosacral region: Secondary | ICD-10-CM | POA: Insufficient documentation

## 2018-05-09 DIAGNOSIS — M5410 Radiculopathy, site unspecified: Secondary | ICD-10-CM | POA: Diagnosis not present

## 2018-05-09 DIAGNOSIS — Z7901 Long term (current) use of anticoagulants: Secondary | ICD-10-CM | POA: Diagnosis not present

## 2018-05-09 DIAGNOSIS — M51379 Other intervertebral disc degeneration, lumbosacral region without mention of lumbar back pain or lower extremity pain: Secondary | ICD-10-CM

## 2018-05-09 MED ORDER — SODIUM CHLORIDE 0.9% FLUSH
2.0000 mL | Freq: Once | INTRAVENOUS | Status: AC
  Administered 2018-05-09: 2 mL

## 2018-05-09 MED ORDER — TRIAMCINOLONE ACETONIDE 40 MG/ML IJ SUSP
40.0000 mg | Freq: Once | INTRAMUSCULAR | Status: AC
  Administered 2018-05-09: 40 mg
  Filled 2018-05-09: qty 1

## 2018-05-09 MED ORDER — IOPAMIDOL (ISOVUE-M 200) INJECTION 41%
10.0000 mL | Freq: Once | INTRAMUSCULAR | Status: AC
  Administered 2018-05-09: 10 mL via EPIDURAL
  Filled 2018-05-09: qty 10

## 2018-05-09 MED ORDER — LIDOCAINE HCL 2 % IJ SOLN
INTRAMUSCULAR | Status: AC
  Filled 2018-05-09: qty 20

## 2018-05-09 MED ORDER — ROPIVACAINE HCL 2 MG/ML IJ SOLN
2.0000 mL | Freq: Once | INTRAMUSCULAR | Status: AC
  Administered 2018-05-09: 2 mL via EPIDURAL
  Filled 2018-05-09: qty 10

## 2018-05-09 NOTE — Patient Instructions (Addendum)
____________________________________________________________________________________________  Post-Procedure Discharge Instructions  Instructions:  Apply ice: Fill a plastic sandwich bag with crushed ice. Cover it with a small towel and apply to injection site. Apply for 15 minutes then remove x 15 minutes. Repeat sequence on day of procedure, until you go to bed. The purpose is to minimize swelling and discomfort after procedure.  Apply heat: Apply heat to procedure site starting the day following the procedure. The purpose is to treat any soreness and discomfort from the procedure.  Food intake: Start with clear liquids (like water) and advance to regular food, as tolerated.   Physical activities: Keep activities to a minimum for the first 8 hours after the procedure.   Driving: If you have received any sedation, you are not allowed to drive for 24 hours after your procedure.  Blood thinner: Restart your blood thinner 6 hours after your procedure. (Only for those taking blood thinners)  Insulin: As soon as you can eat, you may resume your normal dosing schedule. (Only for those taking insulin)  Infection prevention: Keep procedure site clean and dry.  Post-procedure Pain Diary: Extremely important that this be done correctly and accurately. Recorded information will be used to determine the next step in treatment.  Pain evaluated is that of treated area only. Do not include pain from an untreated area.  Complete every hour, on the hour, for the initial 8 hours. Set an alarm to help you do this part accurately.  Do not go to sleep and have it completed later. It will not be accurate.  Follow-up appointment: Keep your follow-up appointment after the procedure. Usually 2 weeks for most procedures. (6 weeks in the case of radiofrequency.) Bring you pain diary.   Expect:  From numbing medicine (AKA: Local Anesthetics): Numbness or decrease in pain.  Onset: Full effect within 15  minutes of injected.  Duration: It will depend on the type of local anesthetic used. On the average, 1 to 8 hours.   From steroids: Decrease in swelling or inflammation. Once inflammation is improved, relief of the pain will follow.  Onset of benefits: Depends on the amount of swelling present. The more swelling, the longer it will take for the benefits to be seen. In some cases, up to 10 days.  Duration: Steroids will stay in the system x 2 weeks. Duration of benefits will depend on multiple posibilities including persistent irritating factors.  Occasional side-effects: Facial flushing (red, warm cheeks) , cramps (if present, drink Gatorade and take over-the-counter Magnesium 450-500 mg once to twice a day).  From procedure: Some discomfort is to be expected once the numbing medicine wears off. This should be minimal if ice and heat are applied as instructed.  Call if:  You experience numbness and weakness that gets worse with time, as opposed to wearing off.  New onset bowel or bladder incontinence. (This applies to Spinal procedures only)  Emergency Numbers:  Durning business hours (Monday - Thursday, 8:00 AM - 4:00 PM) (Friday, 9:00 AM - 12:00 Noon): [phone]  After hours: [phone] ____________________________________________________________________________________________   Pain Management Discharge Instructions  General Discharge Instructions :  If you need to reach your doctor call: Monday-Friday 8:00 am - 4:00 pm at [phone] or toll free [phone].  After clinic hours [phone] to have operator reach doctor.  Bring all of your medication bottles to all your appointments in the pain clinic.  To cancel or reschedule your appointment with Pain Management please remember to call 24 hours in advance to  avoid a fee.  Refer to the educational materials which you have been given on: General Risks, I had my Procedure. Discharge Instructions, Post  Sedation.  Post Procedure Instructions:  The drugs you were given will stay in your system until tomorrow, so for the next 24 hours you should not drive, make any legal decisions or drink any alcoholic beverages.  You may eat anything you prefer, but it is better to start with liquids then soups and crackers, and gradually work up to solid foods.  Please notify your doctor immediately if you have any unusual bleeding, trouble breathing or pain that is not related to your normal pain.  Depending on the type of procedure that was done, some parts of your body may feel week and/or numb.  This usually clears up by tonight or the next day.  Walk with the use of an assistive device or accompanied by an adult for the 24 hours.  You may use ice on the affected area for the first 24 hours.  Put ice in a Ziploc bag and cover with a towel and place against area 15 minutes on 15 minutes off.  You may switch to heat after 24 hours.Epidural Steroid Injection Patient Information  Description: The epidural space surrounds the nerves as they exit the spinal cord.  In some patients, the nerves can be compressed and inflamed by a bulging disc or a tight spinal canal (spinal stenosis).  By injecting steroids into the epidural space, we can bring irritated nerves into direct contact with a potentially helpful medication.  These steroids act directly on the irritated nerves and can reduce swelling and inflammation which often leads to decreased pain.  Epidural steroids may be injected anywhere along the spine and from the neck to the low back depending upon the location of your pain.   After numbing the skin with local anesthetic (like Novocaine), a small needle is passed into the epidural space slowly.  You may experience a sensation of pressure while this is being done.  The entire block usually last less than 10 minutes.  Conditions which may be treated by epidural steroids:   Low back and leg pain  Neck and  arm pain  Spinal stenosis  Post-laminectomy syndrome  Herpes zoster (shingles) pain  Pain from compression fractures  Preparation for the injection:  1. Do not eat any solid food or dairy products within 8 hours of your appointment.  2. You may drink clear liquids up to 3 hours before appointment.  Clear liquids include water, black coffee, juice or soda.  No milk or cream please. 3. You may take your regular medication, including pain medications, with a sip of water before your appointment  Diabetics should hold regular insulin (if taken separately) and take 1/2 normal NPH dos the morning of the procedure.  Carry some sugar containing items with you to your appointment. 4. A driver must accompany you and be prepared to drive you home after your procedure.  5. Bring all your current medications with your. 6. An IV may be inserted and sedation may be given at the discretion of the physician.   7. A blood pressure cuff, EKG and other monitors will often be applied during the procedure.  Some patients may need to have extra oxygen administered for a short period. 8. You will be asked to provide medical information, including your allergies, prior to the procedure.  We must know immediately if you are taking blood thinners (like Coumadin/Warfarin)  Or  if you are allergic to IV iodine contrast (dye). We must know if you could possible be pregnant.  Possible side-effects:  Bleeding from needle site  Infection (rare, may require surgery)  Nerve injury (rare)  Numbness & tingling (temporary)  Difficulty urinating (rare, temporary)  Spinal headache ( a headache worse with upright posture)  Light -headedness (temporary)  Pain at injection site (several days)  Decreased blood pressure (temporary)  Weakness in arm/leg (temporary)  Pressure sensation in back/neck (temporary)  Call if you experience:  Fever/chills associated with headache or increased back/neck pain.  Headache  worsened by an upright position.  New onset weakness or numbness of an extremity below the injection site  Hives or difficulty breathing (go to the emergency room)  Inflammation or drainage at the infection site  Severe back/neck pain  Any new symptoms which are concerning to you  Please note:  Although the local anesthetic injected can often make your back or neck feel good for several hours after the injection, the pain will likely return.  It takes 3-7 days for steroids to work in the epidural space.  You may not notice any pain relief for at least that one week.  If effective, we will often do a series of three injections spaced 3-6 weeks apart to maximally decrease your pain.  After the initial series, we generally will wait several months before considering a repeat injection of the same type.  If you have any questions, please call [phone] [place]

## 2018-05-09 NOTE — Progress Notes (Signed)
Patient's Name: Kara [name]  MRN: [id number]  Referring Provider: Tracie Harrier, MD  DOB: [birth year]  PCP: Tracie Harrier, MD  DOS: 05/09/2018  Note by: Gaspar Cola, MD  Service setting: Ambulatory outpatient  Specialty: Interventional Pain Management  Patient type: Established  Location: [name] (AMB) Pain Management Facility  Visit type: Interventional Procedure   Primary Reason for Visit: Interventional Pain Management Treatment. CC: Back Pain (lower)  Procedure:          Anesthesia, Analgesia, Anxiolysis:  Type: Diagnostic Epidural Steroid Injection #2  Region: Caudal Level: Sacrococcygeal   Laterality: Midline       Type: Local Anesthesia Indication(s): Analgesia         Route: Infiltration (Laird/IM) IV Access: Declined Sedation: Declined  Local Anesthetic: Lidocaine 1-2%  Position: Prone   Indications: 1. DDD (degenerative disc disease), lumbosacral   2. 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 Chronic radicular pain of lower extremity   4. Chronic lower extremity pain (Secondary Area of Pain) (Bilateral)   5. 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 Sacral insufficiency fracture   7. Coccygodynia   8. Chronic anticoagulation (XARELTO)   9. History of allergy to latex    Pain Score: Pre-procedure: 9 /10 Post-procedure: 2 /10  Pre-op Assessment:  Kara [name] is a 67 y.o. (year old), female patient, seen today for interventional treatment. She  has a past surgical history that includes Knee surgery; Carpal tunnel release (Bilateral); Colon resection sigmoid (N/A, 11/02/2016); Colostomy (N/A, 11/02/2016); Sigmoidoscopy (N/A, 11/13/2016); PULMONARY VENOGRAPHY (N/A, 11/19/2016); Colonoscopy with propofol (N/A, 02/03/2017); Colon surgery (11/02/2016); IVC FILTER INSERTION (Right, 10/2016); Colostomy reversal (N/A, 02/24/2017); Appendectomy (N/A, 02/24/2017); Lysis of adhesion (N/A,  02/24/2017); laparoscopy (N/A, 02/24/2017); Ileo loop diversion (N/A, 02/24/2017); Sigmoidoscopy (N/A, 05/19/2017); Ileostomy closure (N/A, 06/01/2017); IVC FILTER REMOVAL (N/A, 08/09/2017); and Sacroplasty (N/A, 11/08/2017). Kara [name] has a current medication list which includes the following prescription(s): aspirin ec, candesartan, cyclobenzaprine, gabapentin, glipizide, hydrocodone-acetaminophen, meclizine, multi-vitamins, rivaroxaban, ropinirole, and hydrocodone-acetaminophen. Her primarily concern today is the Back Pain (lower)  Initial Vital Signs:  Pulse/HCG Rate: 92ECG Heart Rate: 91 Temp: 97.6 F (36.4 C) Resp: 18 BP: (!) 99/54 SpO2: 95 %  BMI: Estimated body mass index is 38.62 kg/m as calculated from the following:   Height as of this encounter: 5\' 3"  (1.6 m).   Weight as of this encounter: 218 lb (98.9 kg).  Risk Assessment: Allergies: Reviewed. She is allergic to adhesive [tape]; morphine; other; latex; and morphine and related.  Allergy Precautions: Latex-free protocol activated Coagulopathies: Reviewed. None identified.  Blood-thinner therapy: None at this time Active Infection(s): Reviewed. None identified. Kara [name] is afebrile  Site Confirmation: Kara [name] was asked to confirm the procedure and laterality before marking the site Procedure checklist: Completed Consent: Before the procedure and under the influence of no sedative(s), amnesic(s), or anxiolytics, the patient was informed of the treatment options, risks and possible complications. To fulfill our ethical and legal obligations, as recommended by the American Medical Association's Code of Ethics, I have informed the patient of my clinical impression; the nature and purpose of the treatment or procedure; the risks, benefits, and possible complications of the intervention; the alternatives, including doing nothing; the risk(s) and benefit(s) of the alternative treatment(s) or procedure(s);  and the risk(s) and benefit(s) of doing nothing. The patient was provided information about the general risks and possible complications associated with the procedure. These may include, but are not  limited to: failure to achieve desired goals, infection, bleeding, organ or nerve damage, allergic reactions, paralysis, and death. In addition, the patient was informed of those risks and complications associated to Spine-related procedures, such as failure to decrease pain; infection (i.e.: Meningitis, epidural or intraspinal abscess); bleeding (i.e.: epidural hematoma, subarachnoid hemorrhage, or any other type of intraspinal or peri-dural bleeding); organ or nerve damage (i.e.: Any type of peripheral nerve, nerve root, or spinal cord injury) with subsequent damage to sensory, motor, and/or autonomic systems, resulting in permanent pain, numbness, and/or weakness of one or several areas of the body; allergic reactions; (i.e.: anaphylactic reaction); and/or death. Furthermore, the patient was informed of those risks and complications associated with the medications. These include, but are not limited to: allergic reactions (i.e.: anaphylactic or anaphylactoid reaction(s)); adrenal axis suppression; blood sugar elevation that in diabetics may result in ketoacidosis or comma; water retention that in patients with history of congestive heart failure may result in shortness of breath, pulmonary edema, and decompensation with resultant heart failure; weight gain; swelling or edema; medication-induced neural toxicity; particulate matter embolism and blood vessel occlusion with resultant organ, and/or nervous system infarction; and/or aseptic necrosis of one or more joints. Finally, the patient was informed that Medicine is not an exact science; therefore, there is also the possibility of unforeseen or unpredictable risks and/or possible complications that may result in a catastrophic outcome. The patient indicated having  understood very clearly. We have given the patient no guarantees and we have made no promises. Enough time was given to the patient to ask questions, all of which were answered to the patient's satisfaction. Kara [name] has indicated that she wanted to continue with the procedure. Attestation: I, the ordering provider, attest that I have discussed with the patient the benefits, risks, side-effects, alternatives, likelihood of achieving goals, and potential problems during recovery for the procedure that I have provided informed consent. Date  Time: 05/09/2018 11:04 AM  Pre-Procedure Preparation:  Monitoring: As per clinic protocol. Respiration, ETCO2, SpO2, BP, heart rate and rhythm monitor placed and checked for adequate function Safety Precautions: Patient was assessed for positional comfort and pressure points before starting the procedure. Time-out: I initiated and conducted the "Time-out" before starting the procedure, as per protocol. The patient was asked to participate by confirming the accuracy of the "Time Out" information. Verification of the correct person, site, and procedure were performed and confirmed by me, the nursing staff, and the patient. "Time-out" conducted as per Joint Commission's Universal Protocol (UP.01.01.01). Time: 1147  Description of Procedure:          Target Area: Caudal Epidural Canal. Approach: Midline approach. Area Prepped: Entire Posterior Sacrococcygeal Region Prepping solution: ChloraPrep (2% chlorhexidine gluconate and 70% isopropyl alcohol) Safety Precautions: Aspiration looking for blood return was conducted prior to all injections. At no point did we inject any substances, as a needle was being advanced. No attempts were made at seeking any paresthesias. Safe injection practices and needle disposal techniques used. Medications properly checked for expiration dates. SDV (single dose vial) medications used. Description of the Procedure: Protocol  guidelines were followed. The patient was placed in position over the fluoroscopy table. The target area was identified and the area prepped in the usual manner. Skin & deeper tissues infiltrated with local anesthetic. Appropriate amount of time allowed to pass for local anesthetics to take effect. The procedure needles were then advanced to the target area. Proper needle placement secured. Negative aspiration confirmed. Solution injected in intermittent fashion, asking  for systemic symptoms every 0.5cc of injectate. The needles were then removed and the area cleansed, making sure to leave some of the prepping solution back to take advantage of its long term bactericidal properties. Vitals:   05/09/18 1112 05/09/18 1147 05/09/18 1152 05/09/18 1155  BP:  (!) 99/54 106/63 102/68  Pulse: 92     Resp: 18 20 (!) 21   Temp: 97.6 F (36.4 C)     TempSrc: Oral     SpO2: 95% 95% 95% 96%  Weight: 218 lb (98.9 kg)     Height: 5\' 3"  (1.6 m)       Start Time: 1147 hrs. End Time: 1155 hrs. Materials:  Needle(s) Type: Epidural needle Gauge: 17G Length: 3.5-in Medication(s): Please see orders for medications and dosing details.  Imaging Guidance (Spinal):          Type of Imaging Technique: Fluoroscopy Guidance (Spinal) Indication(s): Assistance in needle guidance and placement for procedures requiring needle placement in or near specific anatomical locations not easily accessible without such assistance. Exposure Time: Please see nurses notes. Contrast: Before injecting any contrast, we confirmed that the patient did not have an allergy to iodine, shellfish, or radiological contrast. Once satisfactory needle placement was completed at the desired level, radiological contrast was injected. Contrast injected under live fluoroscopy. No contrast complications. See chart for type and volume of contrast used. Fluoroscopic Guidance: I was personally present during the use of fluoroscopy. "Tunnel Vision  Technique" used to obtain the best possible view of the target area. Parallax error corrected before commencing the procedure. "Direction-depth-direction" technique used to introduce the needle under continuous pulsed fluoroscopy. Once target was reached, antero-posterior, oblique, and lateral fluoroscopic projection used confirm needle placement in all planes. Images permanently stored in EMR. Interpretation: I personally interpreted the imaging intraoperatively. Adequate needle placement confirmed in multiple planes. Appropriate spread of contrast into desired area was observed. No evidence of afferent or efferent intravascular uptake. No intrathecal or subarachnoid spread observed. Permanent images saved into the patient's record.  Antibiotic Prophylaxis:   Anti-infectives (From admission, onward)   None     Indication(s): None identified  Post-operative Assessment:  Post-procedure Vital Signs:  Pulse/HCG Rate: 9291 Temp: 97.6 F (36.4 C) Resp: (!) 21 BP: 102/68 SpO2: 96 %  EBL: None  Complications: No immediate post-treatment complications observed by team, or reported by patient.  Note: The patient tolerated the entire procedure well. A repeat set of vitals were taken after the procedure and the patient was kept under observation following institutional policy, for this type of procedure. Post-procedural neurological assessment was performed, showing return to baseline, prior to discharge. The patient was provided with post-procedure discharge instructions, including a section on how to identify potential problems. Should any problems arise concerning this procedure, the patient was given instructions to immediately contact us, at any time, without hesitation. In any case, we plan to contact the patient by telephone for a follow-up status report regarding this interventional procedure.  Comments:  No additional relevant information.  Plan of Care    Imaging Orders     DG C-Arm  1-60 Min-No Report  Procedure Orders     Caudal Epidural Injection  Medications ordered for procedure: Meds ordered this encounter  Medications  . iopamidol (ISOVUE-M) 41 % intrathecal injection 10 mL    Must be Myelogram-compatible. If not available, you may substitute with a water-soluble, non-ionic, hypoallergenic, myelogram-compatible radiological contrast medium.  . sodium chloride flush (NS) 0.9 % injection 2 mL  .  ropivacaine (PF) 2 mg/mL (0.2%) (NAROPIN) injection 2 mL  . triamcinolone acetonide (KENALOG-40) injection 40 mg   Medications administered: We administered iopamidol, sodium chloride flush, ropivacaine (PF) 2 mg/mL (0.2%), and triamcinolone acetonide.  See the medical record for exact dosing, route, and time of administration.  Disposition: Discharge home  Discharge Date & Time: 05/09/2018; 1205 hrs.   Physician-requested Follow-up: Return for post-procedure eval (2 wks), w/ Dionisio David, NP.  Future Appointments  Date Time Provider [place]  06/01/2018  9:15 AM Vevelyn Francois, NP [facility] None   Primary Care Physician: Tracie Harrier, MD Location: [facility] Note by: Gaspar Cola, MD Date: 05/09/2018; Time: 12:11 PM  Disclaimer:  Medicine is not an Chief Strategy Officer. The only guarantee in medicine is that nothing is guaranteed. It is important to note that the decision to proceed with this intervention was based on the information collected from the patient. The Data and conclusions were drawn from the patient's questionnaire, the interview, and the physical examination. Because the information was provided in large part by the patient, it cannot be guaranteed that it has not been purposely or unconsciously manipulated. Every effort has been made to obtain as much relevant data as possible for this evaluation. It is important to note that the conclusions that lead to this procedure are derived in large part from the  available data. Always take into account that the treatment will also be dependent on availability of resources and existing treatment guidelines, considered by other Pain Management Practitioners as being common knowledge and practice, at the time of the intervention. For Medico-Legal purposes, it is also important to point out that variation in procedural techniques and pharmacological choices are the acceptable norm. The indications, contraindications, technique, and results of the above procedure should only be interpreted and judged by a Board-Certified Interventional Pain Specialist with extensive familiarity and expertise in the same exact procedure and technique.

## 2018-05-10 ENCOUNTER — Telehealth: Payer: Self-pay

## 2018-05-10 NOTE — Telephone Encounter (Signed)
Post procedure phone call.  LM 

## 2018-05-16 DIAGNOSIS — H9210 Otorrhea, unspecified ear: Secondary | ICD-10-CM | POA: Diagnosis not present

## 2018-05-16 DIAGNOSIS — H6000 Abscess of external ear, unspecified ear: Secondary | ICD-10-CM | POA: Diagnosis not present

## 2018-05-29 ENCOUNTER — Telehealth: Payer: Self-pay | Admitting: Pain Medicine

## 2018-05-29 NOTE — Telephone Encounter (Signed)
Patient lvmail stating she does not want to see Crystal. Only Dr. Dossie Arbour. She is scheduled w/Crystal on Thursday. I canceled and gave her an appt to speak with Dr. Dossie Arbour 06-07-18 at 8:00

## 2018-05-30 ENCOUNTER — Ambulatory Visit: Payer: PPO | Admitting: Pain Medicine

## 2018-06-01 ENCOUNTER — Ambulatory Visit: Payer: PPO | Admitting: Nurse Practitioner

## 2018-06-05 DIAGNOSIS — R6889 Other general symptoms and signs: Secondary | ICD-10-CM | POA: Diagnosis not present

## 2018-06-07 ENCOUNTER — Ambulatory Visit: Payer: PPO | Admitting: Pain Medicine

## 2018-06-08 ENCOUNTER — Ambulatory Visit
Admission: RE | Admit: 2018-06-08 | Discharge: 2018-06-08 | Disposition: A | Discharge status: Home / Self Care | Payer: PPO | Source: Ambulatory Visit | Attending: Pain Medicine | Admitting: Pain Medicine

## 2018-06-08 ENCOUNTER — Other Ambulatory Visit: Payer: Self-pay

## 2018-06-08 ENCOUNTER — Ambulatory Visit (HOSPITAL_BASED_OUTPATIENT_CLINIC_OR_DEPARTMENT_OTHER): Payer: PPO | Admitting: Pain Medicine

## 2018-06-08 ENCOUNTER — Encounter: Payer: Self-pay | Admitting: Pain Medicine

## 2018-06-08 VITALS — BP 111/72 | HR 88 | Temp 98.0°F | Resp 18 | Ht 63.0 in | Wt 211.0 lb

## 2018-06-08 DIAGNOSIS — M79605 Pain in left leg: Secondary | ICD-10-CM | POA: Insufficient documentation

## 2018-06-08 DIAGNOSIS — M4807 Spinal stenosis, lumbosacral region: Secondary | ICD-10-CM

## 2018-06-08 DIAGNOSIS — M5442 Lumbago with sciatica, left side: Secondary | ICD-10-CM | POA: Insufficient documentation

## 2018-06-08 DIAGNOSIS — G894 Chronic pain syndrome: Secondary | ICD-10-CM | POA: Insufficient documentation

## 2018-06-08 DIAGNOSIS — M5441 Lumbago with sciatica, right side: Secondary | ICD-10-CM | POA: Diagnosis not present

## 2018-06-08 DIAGNOSIS — M5137 Other intervertebral disc degeneration, lumbosacral region: Secondary | ICD-10-CM | POA: Diagnosis not present

## 2018-06-08 DIAGNOSIS — M533 Sacrococcygeal disorders, not elsewhere classified: Secondary | ICD-10-CM

## 2018-06-08 DIAGNOSIS — M8448XS Pathological fracture, other site, sequela: Secondary | ICD-10-CM | POA: Diagnosis not present

## 2018-06-08 DIAGNOSIS — Z91040 Latex allergy status: Secondary | ICD-10-CM

## 2018-06-08 DIAGNOSIS — Z7901 Long term (current) use of anticoagulants: Secondary | ICD-10-CM

## 2018-06-08 DIAGNOSIS — M5410 Radiculopathy, site unspecified: Secondary | ICD-10-CM | POA: Insufficient documentation

## 2018-06-08 DIAGNOSIS — M79604 Pain in right leg: Secondary | ICD-10-CM | POA: Insufficient documentation

## 2018-06-08 DIAGNOSIS — G8929 Other chronic pain: Secondary | ICD-10-CM | POA: Diagnosis not present

## 2018-06-08 MED ORDER — ROPIVACAINE HCL 2 MG/ML IJ SOLN
2.0000 mL | Freq: Once | INTRAMUSCULAR | Status: AC
  Administered 2018-06-08: 2 mL via EPIDURAL
  Filled 2018-06-08: qty 10

## 2018-06-08 MED ORDER — LIDOCAINE HCL 2 % IJ SOLN
20.0000 mL | Freq: Once | INTRAMUSCULAR | Status: AC
  Administered 2018-06-08: 400 mg
  Filled 2018-06-08: qty 20

## 2018-06-08 MED ORDER — SODIUM CHLORIDE 0.9% FLUSH
2.0000 mL | Freq: Once | INTRAVENOUS | Status: AC
  Administered 2018-06-08: 2 mL

## 2018-06-08 MED ORDER — DEXAMETHASONE SODIUM PHOSPHATE 10 MG/ML IJ SOLN
10.0000 mg | Freq: Once | INTRAMUSCULAR | Status: AC
  Administered 2018-06-08: 10 mg
  Filled 2018-06-08: qty 1

## 2018-06-08 MED ORDER — TRIAMCINOLONE ACETONIDE 40 MG/ML IJ SUSP
40.0000 mg | Freq: Once | INTRAMUSCULAR | Status: DC
  Filled 2018-06-08: qty 1

## 2018-06-08 MED ORDER — IOPAMIDOL (ISOVUE-M 200) INJECTION 41%
10.0000 mL | Freq: Once | INTRAMUSCULAR | Status: AC
  Administered 2018-06-08: 10 mL via EPIDURAL
  Filled 2018-06-08: qty 10

## 2018-06-08 MED ORDER — HYDROCODONE-ACETAMINOPHEN 5-325 MG PO TABS: 1.0000 | ORAL_TABLET | Freq: Two times a day (BID) | ORAL | 0 refills | Status: DC | PRN

## 2018-06-08 NOTE — Patient Instructions (Signed)

## 2018-06-08 NOTE — Progress Notes (Signed)
Safety precautions to be maintained throughout the outpatient stay will include: orient to surroundings, keep bed in low position, maintain call bell within reach at all times, provide assistance with transfer out of bed and ambulation.  

## 2018-06-08 NOTE — Progress Notes (Signed)
Patient's Name: Kara [name]  MRN: [id number]  Referring Provider: Milinda Pointer, MD  DOB: 19-Nov-1950  PCP: Tracie Harrier, MD  DOS: 06/08/2018  Note by: Gaspar Cola, MD  Service setting: Ambulatory outpatient  Specialty: Interventional Pain Management  Patient type: Established  Location: [name] (AMB) Pain Management Facility  Visit type: Interventional Procedure   Primary Reason for Visit: Interventional Pain Management Treatment. CC: Back Pain (right, lower)  Procedure:          Anesthesia, Analgesia, Anxiolysis:  Type: Diagnostic Epidural Steroid Injection #3  Region: Caudal Level: Sacrococcygeal   Laterality: Midline       Type: Local Anesthesia Indication(s): Analgesia         Route: Infiltration (/IM) IV Access: Declined Sedation: Declined  Local Anesthetic: Lidocaine 1-2%  Position: Prone   Indications: 1. DDD (degenerative disc disease), lumbosacral   2. Coccygodynia   3. 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 Sacral insufficiency fracture   5. Chronic anticoagulation (XARELTO)   6. History of allergy to latex    Pain Score: Pre-procedure: 8 /10 Post-procedure: 1 /10  Pre-op Assessment:  Kara [name] is a 68 y.o. (year old), female patient, seen today for interventional treatment. She  has a past surgical history that includes Knee surgery; Carpal tunnel release (Bilateral); Colon resection sigmoid (N/A, 11/02/2016); Colostomy (N/A, 11/02/2016); Sigmoidoscopy (N/A, 11/13/2016); PULMONARY VENOGRAPHY (N/A, 11/19/2016); Colonoscopy with propofol (N/A, 02/03/2017); Colon surgery (11/02/2016); IVC FILTER INSERTION (Right, 10/2016); Colostomy reversal (N/A, 02/24/2017); Appendectomy (N/A, 02/24/2017); Lysis of adhesion (N/A, 02/24/2017); laparoscopy (N/A, 02/24/2017); Ileo loop diversion (N/A, 02/24/2017); Sigmoidoscopy (N/A, 05/19/2017); Ileostomy closure (N/A, 06/01/2017); IVC FILTER REMOVAL (N/A, 08/09/2017); and  Sacroplasty (N/A, 11/08/2017). Kara [name] has a current medication list which includes the following prescription(s): aspirin ec, candesartan, cyclobenzaprine, gabapentin, glipizide, meclizine, multi-vitamins, rivaroxaban, ropinirole, and hydrocodone-acetaminophen. Her primarily concern today is the Back Pain (right, lower)  Initial Vital Signs:  Pulse/HCG Rate: 94  Temp: 98 F (36.7 C) Resp: 16 BP: 126/75 SpO2: 96 %  BMI: Estimated body mass index is 37.38 kg/m as calculated from the following:   Height as of this encounter: 5\' 3"  (1.6 m).   Weight as of this encounter: 211 lb (95.7 kg).  Risk Assessment: Allergies: Reviewed. She is allergic to adhesive [tape]; other; latex; and morphine and related.  Allergy Precautions: Latex-free protocol activated Coagulopathies: Reviewed. None identified.  Blood-thinner therapy: None at this time Active Infection(s): Reviewed. None identified. Kara [name] is afebrile  Site Confirmation: Kara [name] was asked to confirm the procedure and laterality before marking the site Procedure checklist: Completed Consent: Before the procedure and under the influence of no sedative(s), amnesic(s), or anxiolytics, the patient was informed of the treatment options, risks and possible complications. To fulfill our ethical and legal obligations, as recommended by the American Medical Association's Code of Ethics, I have informed the patient of my clinical impression; the nature and purpose of the treatment or procedure; the risks, benefits, and possible complications of the intervention; the alternatives, including doing nothing; the risk(s) and benefit(s) of the alternative treatment(s) or procedure(s); and the risk(s) and benefit(s) of doing nothing. The patient was provided information about the general risks and possible complications associated with the procedure. These may include, but are not limited to: failure to achieve desired  goals, infection, bleeding, organ or nerve damage, allergic reactions, paralysis, and death. In addition, the patient was informed of those risks and complications associated to Spine-related procedures, such as failure to decrease pain;  infection (i.e.: Meningitis, epidural or intraspinal abscess); bleeding (i.e.: epidural hematoma, subarachnoid hemorrhage, or any other type of intraspinal or peri-dural bleeding); organ or nerve damage (i.e.: Any type of peripheral nerve, nerve root, or spinal cord injury) with subsequent damage to sensory, motor, and/or autonomic systems, resulting in permanent pain, numbness, and/or weakness of one or several areas of the body; allergic reactions; (i.e.: anaphylactic reaction); and/or death. Furthermore, the patient was informed of those risks and complications associated with the medications. These include, but are not limited to: allergic reactions (i.e.: anaphylactic or anaphylactoid reaction(s)); adrenal axis suppression; blood sugar elevation that in diabetics may result in ketoacidosis or comma; water retention that in patients with history of congestive heart failure may result in shortness of breath, pulmonary edema, and decompensation with resultant heart failure; weight gain; swelling or edema; medication-induced neural toxicity; particulate matter embolism and blood vessel occlusion with resultant organ, and/or nervous system infarction; and/or aseptic necrosis of one or more joints. Finally, the patient was informed that Medicine is not an exact science; therefore, there is also the possibility of unforeseen or unpredictable risks and/or possible complications that may result in a catastrophic outcome. The patient indicated having understood very clearly. We have given the patient no guarantees and we have made no promises. Enough time was given to the patient to ask questions, all of which were answered to the patient's satisfaction. Kara [name] has  indicated that she wanted to continue with the procedure. Attestation: I, the ordering provider, attest that I have discussed with the patient the benefits, risks, side-effects, alternatives, likelihood of achieving goals, and potential problems during recovery for the procedure that I have provided informed consent. Date  Time: 06/08/2018  1:38 PM  Pre-Procedure Preparation:  Monitoring: As per clinic protocol. Respiration, ETCO2, SpO2, BP, heart rate and rhythm monitor placed and checked for adequate function Safety Precautions: Patient was assessed for positional comfort and pressure points before starting the procedure. Time-out: I initiated and conducted the "Time-out" before starting the procedure, as per protocol. The patient was asked to participate by confirming the accuracy of the "Time Out" information. Verification of the correct person, site, and procedure were performed and confirmed by me, the nursing staff, and the patient. "Time-out" conducted as per Joint Commission's Universal Protocol (UP.01.01.01). Time: 1435  Description of Procedure:          Target Area: Caudal Epidural Canal. Approach: Midline approach. Area Prepped: Entire Posterior Sacrococcygeal Region Prepping solution: ChloraPrep (2% chlorhexidine gluconate and 70% isopropyl alcohol) Safety Precautions: Aspiration looking for blood return was conducted prior to all injections. At no point did we inject any substances, as a needle was being advanced. No attempts were made at seeking any paresthesias. Safe injection practices and needle disposal techniques used. Medications properly checked for expiration dates. SDV (single dose vial) medications used. Description of the Procedure: Protocol guidelines were followed. The patient was placed in position over the fluoroscopy table. The target area was identified and the area prepped in the usual manner. Skin & deeper tissues infiltrated with local anesthetic. Appropriate  amount of time allowed to pass for local anesthetics to take effect. The procedure needles were then advanced to the target area. Proper needle placement secured. Negative aspiration confirmed. Solution injected in intermittent fashion, asking for systemic symptoms every 0.5cc of injectate. The needles were then removed and the area cleansed, making sure to leave some of the prepping solution back to take advantage of its long term bactericidal properties. Vitals:  06/08/18 1337 06/08/18 1430 06/08/18 1435 06/08/18 1445  BP: 126/75 131/72 105/73 111/72  Pulse: 94 91 89 88  Resp: 16 20 19 18   Temp: 98 F (36.7 C)     TempSrc: Oral     SpO2: 96% 96% 95% 96%  Weight: 211 lb (95.7 kg)     Height: 5\' 3"  (1.6 m)       Start Time: 1435 hrs. End Time: 1441 hrs. Materials:  Needle(s) Type: Epidural needle Gauge: 17G Length: 3.5-in Medication(s): Please see orders for medications and dosing details.  Imaging Guidance (Spinal):          Type of Imaging Technique: Fluoroscopy Guidance (Spinal) Indication(s): Assistance in needle guidance and placement for procedures requiring needle placement in or near specific anatomical locations not easily accessible without such assistance. Exposure Time: Please see nurses notes. Contrast: Before injecting any contrast, we confirmed that the patient did not have an allergy to iodine, shellfish, or radiological contrast. Once satisfactory needle placement was completed at the desired level, radiological contrast was injected. Contrast injected under live fluoroscopy. No contrast complications. See chart for type and volume of contrast used. Fluoroscopic Guidance: I was personally present during the use of fluoroscopy. "Tunnel Vision Technique" used to obtain the best possible view of the target area. Parallax error corrected before commencing the procedure. "Direction-depth-direction" technique used to introduce the needle under continuous pulsed fluoroscopy. Once  target was reached, antero-posterior, oblique, and lateral fluoroscopic projection used confirm needle placement in all planes. Images permanently stored in EMR. Interpretation: I personally interpreted the imaging intraoperatively. Adequate needle placement confirmed in multiple planes. Appropriate spread of contrast into desired area was observed. No evidence of afferent or efferent intravascular uptake. No intrathecal or subarachnoid spread observed. Permanent images saved into the patient's record.  Antibiotic Prophylaxis:   Anti-infectives (From admission, onward)   None     Indication(s): None identified  Post-operative Assessment:  Post-procedure Vital Signs:  Pulse/HCG Rate: 88  Temp: 98 F (36.7 C) Resp: 18 BP: 111/72 SpO2: 96 %  EBL: None  Complications: No immediate post-treatment complications observed by team, or reported by patient.  Note: The patient tolerated the entire procedure well. A repeat set of vitals were taken after the procedure and the patient was kept under observation following institutional policy, for this type of procedure. Post-procedural neurological assessment was performed, showing return to baseline, prior to discharge. The patient was provided with post-procedure discharge instructions, including a section on how to identify potential problems. Should any problems arise concerning this procedure, the patient was given instructions to immediately contact us, at any time, without hesitation. In any case, we plan to contact the patient by telephone for a follow-up status report regarding this interventional procedure.  Comments:  No additional relevant information.  Plan of Care    Imaging Orders     DG C-Arm 1-60 Min-No Report  Procedure Orders     Caudal Epidural Injection  Medications ordered for procedure: Meds ordered this encounter  Medications  . iopamidol (ISOVUE-M) 41 % intrathecal injection 10 mL    Must be Myelogram-compatible. If  not available, you may substitute with a water-soluble, non-ionic, hypoallergenic, myelogram-compatible radiological contrast medium.  Marland Kitchen lidocaine (XYLOCAINE) 2 % (with pres) injection 400 mg  . sodium chloride flush (NS) 0.9 % injection 2 mL  . ropivacaine (PF) 2 mg/mL (0.2%) (NAROPIN) injection 2 mL  . DISCONTD: triamcinolone acetonide (KENALOG-40) injection 40 mg  . dexamethasone (DECADRON) injection 10 mg  . HYDROcodone-acetaminophen (  NORCO/VICODIN) 5-325 MG tablet    Sig: Take 1 tablet by mouth 2 (two) times daily as needed for up to 30 days for severe pain. Must last 30 days.    Dispense:  60 tablet    Refill:  0    [place] STOP ACT - Not applicable. Fill one day early if pharmacy is closed on scheduled refill date. Do not fill until: 06/08/18. Must last 30 days. To last until: 07/08/18.   Medications administered: We administered iopamidol, lidocaine, sodium chloride flush, ropivacaine (PF) 2 mg/mL (0.2%), and dexamethasone.  See the medical record for exact dosing, route, and time of administration.  Disposition: Discharge home  Discharge Date & Time: 06/08/2018; 1448 hrs.   Physician-requested Follow-up: Return for post-procedure eval (2 wks), w/ Dr. Dossie Arbour.  Future Appointments  Date Time Provider [place]  06/28/2018  9:15 AM Milinda Pointer, MD [facility] None   Primary Care Physician: Tracie Harrier, MD Location: [facility] Note by: Gaspar Cola, MD Date: 06/08/2018; Time: 2:56 PM  Disclaimer:  Medicine is not an Chief Strategy Officer. The only guarantee in medicine is that nothing is guaranteed. It is important to note that the decision to proceed with this intervention was based on the information collected from the patient. The Data and conclusions were drawn from the patient's questionnaire, the interview, and the physical examination. Because the information was provided in large part by the patient, it cannot be guaranteed that it  has not been purposely or unconsciously manipulated. Every effort has been made to obtain as much relevant data as possible for this evaluation. It is important to note that the conclusions that lead to this procedure are derived in large part from the available data. Always take into account that the treatment will also be dependent on availability of resources and existing treatment guidelines, considered by other Pain Management Practitioners as being common knowledge and practice, at the time of the intervention. For Medico-Legal purposes, it is also important to point out that variation in procedural techniques and pharmacological choices are the acceptable norm. The indications, contraindications, technique, and results of the above procedure should only be interpreted and judged by a Board-Certified Interventional Pain Specialist with extensive familiarity and expertise in the same exact procedure and technique.

## 2018-06-09 ENCOUNTER — Telehealth: Payer: Self-pay

## 2018-06-11 IMAGING — CR DG LUMBAR SPINE COMPLETE 4+V
1 series · 5 of 5 positions shown · non-contrast
Comparison: 04/21/2017 abdominal/pelvic CT and prior studies

CLINICAL DATA: Acute low back pain following injury 1 month ago.
Initial encounter.

EXAM:
LUMBAR SPINE - COMPLETE 4+ VIEW

[Series 1: dg lumbar spine complete 4 +v · 0.14mm/px · 5 of 5 slices shown]
[im 1/5]
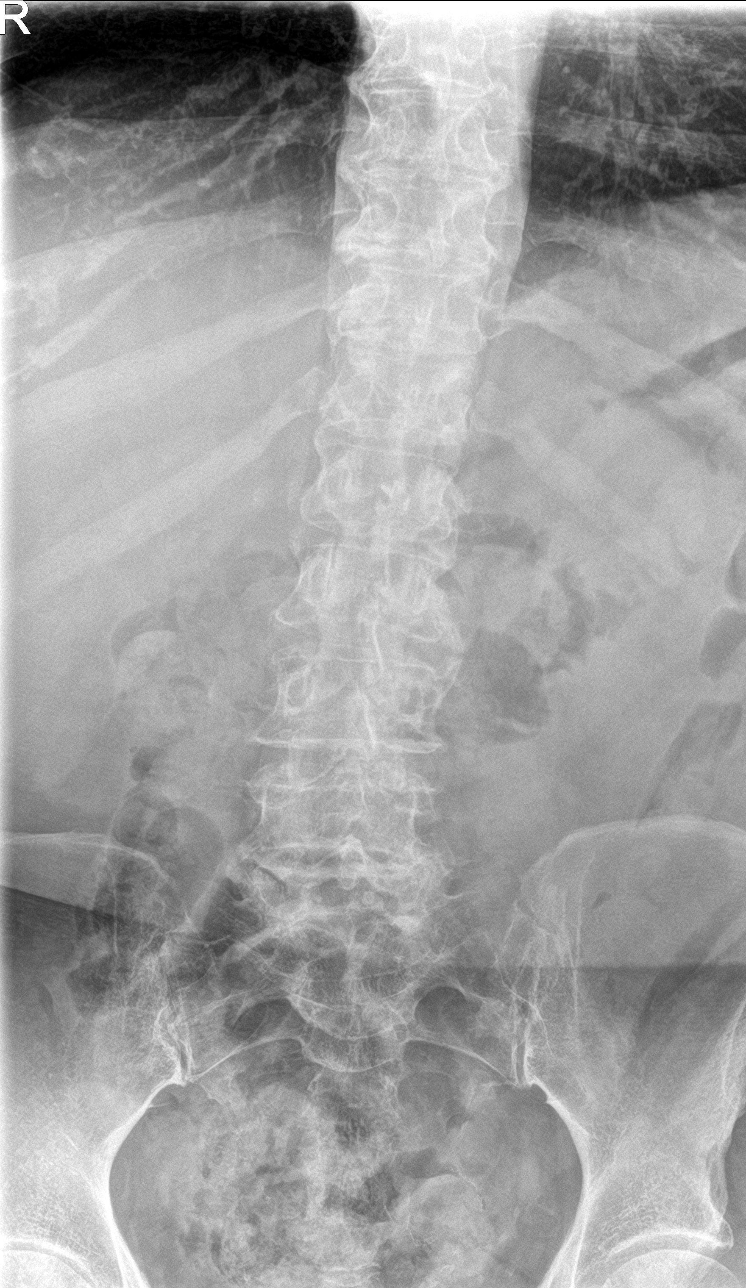
[im 2/5]
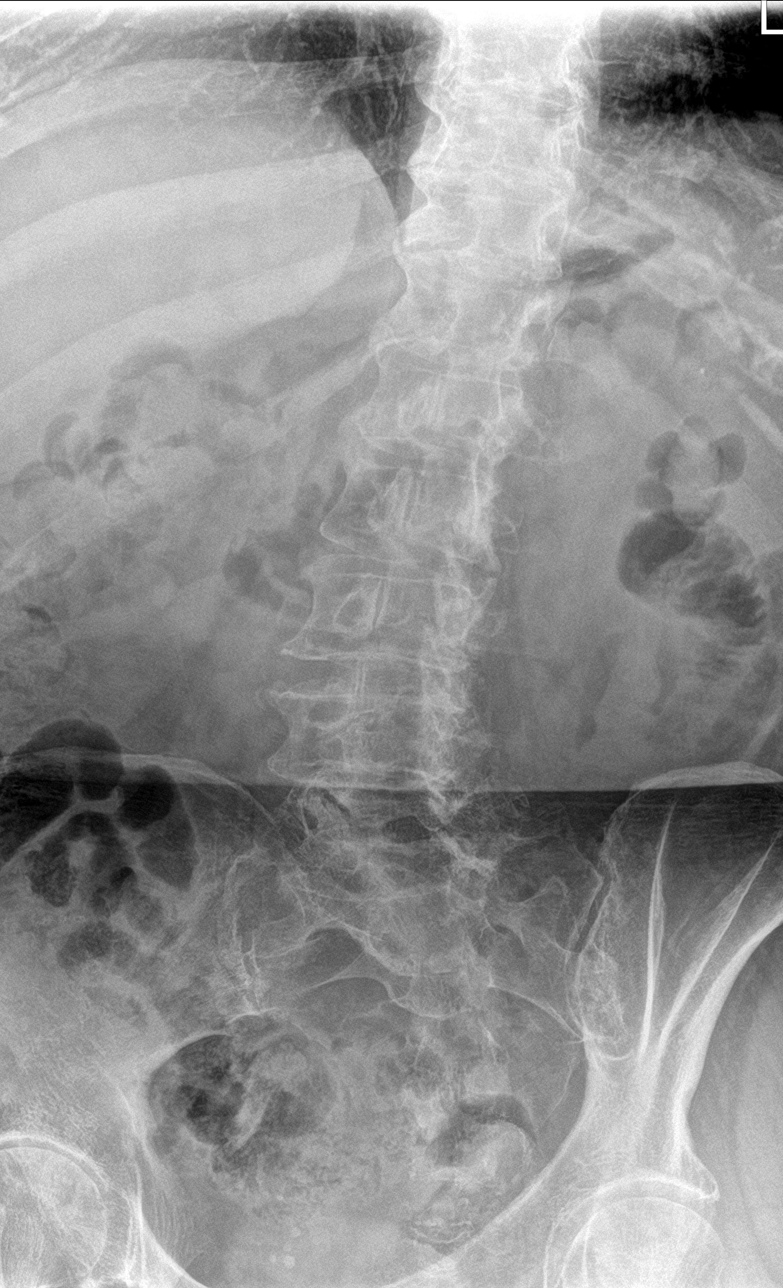
[im 3/5]
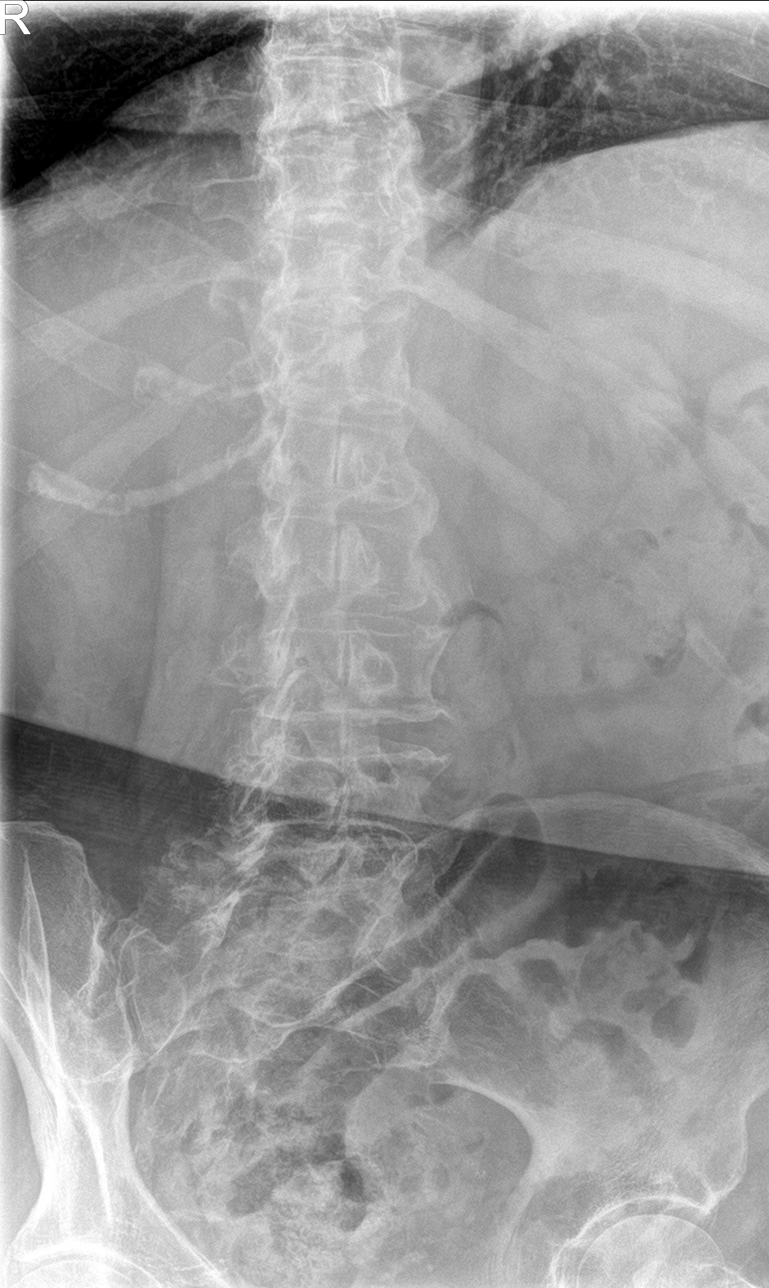
[im 4/5]
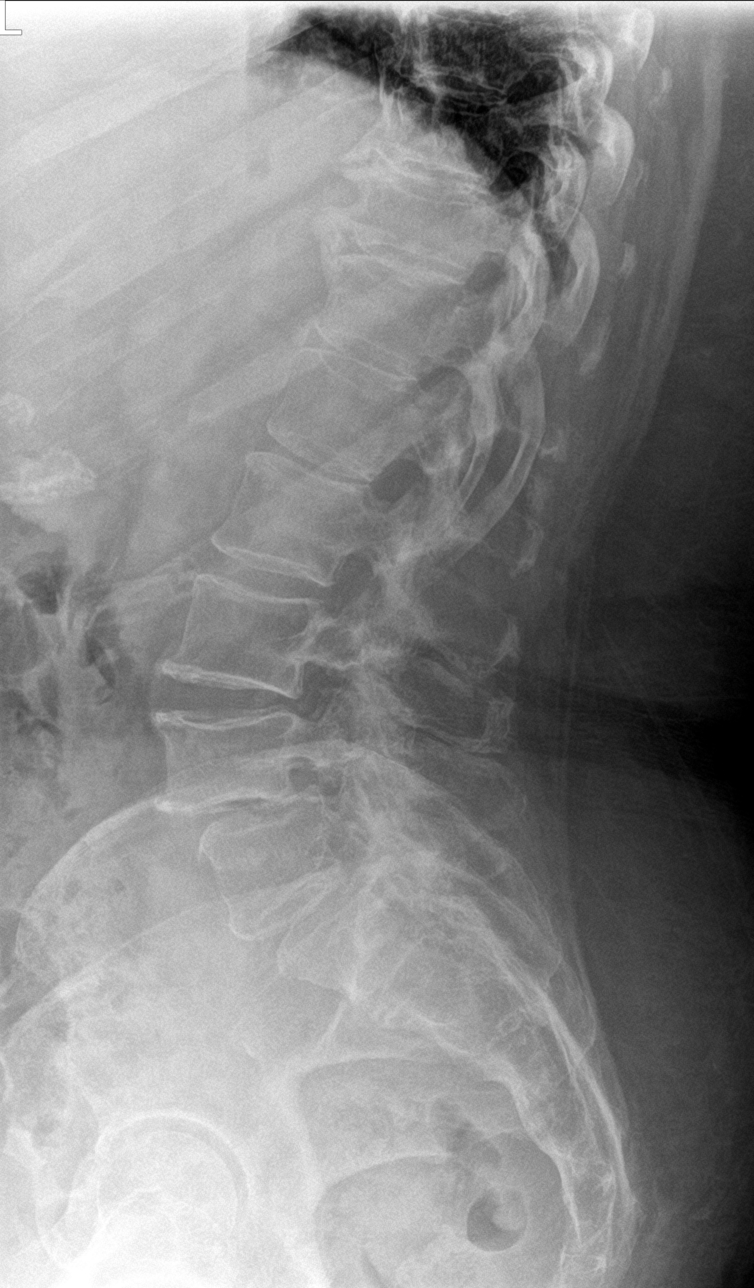
[im 5/5]
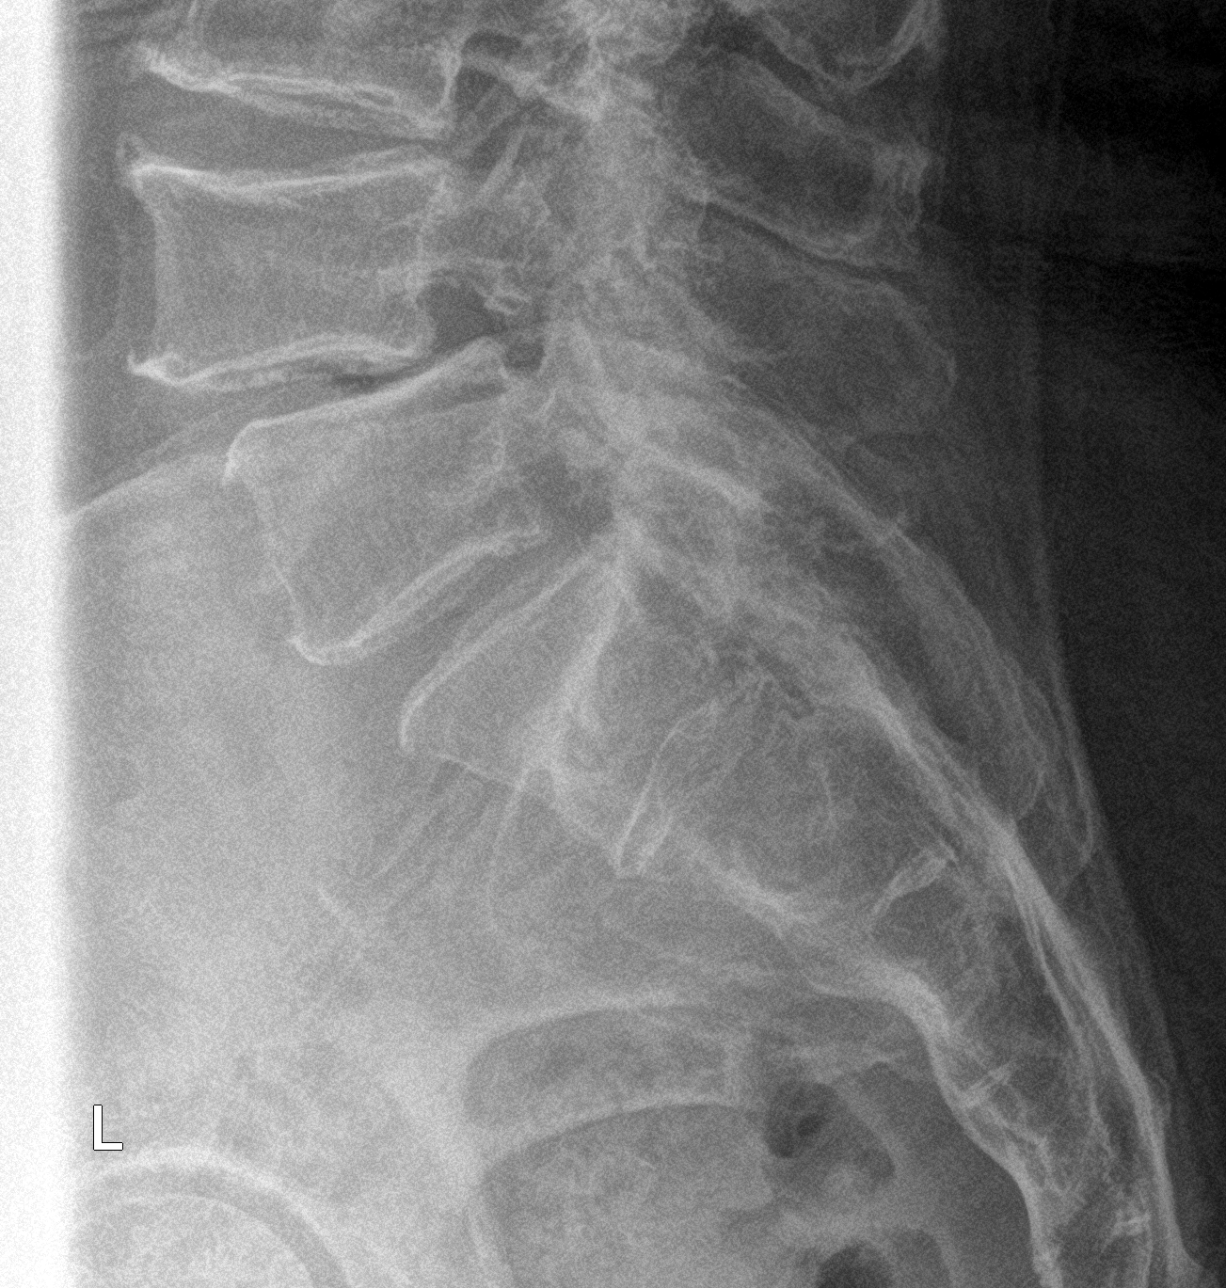

[5 of 5 positions shown; findings below may reference images not displayed]

FINDINGS: Grade 1 anterolisthesis of L4 on L5 is unchanged from 4124.

No acute fracture or subluxation identified.

Mild to moderate multilevel degenerative disc disease/spondylosis
and moderate multilevel facet arthropathy noted.

No focal bony lesions or spondylolysis identified.
IMPRESSION: 1. No evidence of acute abnormality
2. Multilevel degenerative changes and grade 1 spondylolisthesis at
L4-5 again noted.

## 2018-06-12 ENCOUNTER — Ambulatory Visit: Payer: PPO | Admitting: Pain Medicine

## 2018-06-14 DIAGNOSIS — H25043 Posterior subcapsular polar age-related cataract, bilateral: Secondary | ICD-10-CM | POA: Diagnosis not present

## 2018-06-14 DIAGNOSIS — E119 Type 2 diabetes mellitus without complications: Secondary | ICD-10-CM | POA: Diagnosis not present

## 2018-06-14 DIAGNOSIS — H35013 Changes in retinal vascular appearance, bilateral: Secondary | ICD-10-CM | POA: Diagnosis not present

## 2018-06-14 DIAGNOSIS — H43813 Vitreous degeneration, bilateral: Secondary | ICD-10-CM | POA: Diagnosis not present

## 2018-06-19 ENCOUNTER — Telehealth: Payer: Self-pay | Admitting: Pain Medicine

## 2018-06-19 NOTE — Telephone Encounter (Signed)
Patient lvmail at midnight stating she lifted something the other day and felt something pull in her back. Now she is in a lot of pain. Is it too soon to have a procedure.  Please call patient and let her know what to do

## 2018-06-19 NOTE — Telephone Encounter (Signed)
This does sound like appropriate advice

## 2018-06-19 NOTE — Telephone Encounter (Signed)
Returned patient call, she was lifting some oranges out of the back of her care this past Friday and felt something pop, or pull.  This was after having procedure on 06/08/18.  I told patient initially that since this is a new injury that she may need to call her ortho or her primary to see if they want to do xrays since she feels it maybe around the area where the cement is.  I asked her what she has been doing for it and she states heat and pain medication. I told her for a fresh injury that ice may be better off and on.  I told her that typically we like  To have at least 2 weeks between procedures. Patient verbalizes u/o this. She has f/up appt on 06/28/18 with Dr Dossie Arbour and will keep that.  Instructed that if things don't improve she should call us or her primary back.

## 2018-06-20 DIAGNOSIS — I10 Essential (primary) hypertension: Secondary | ICD-10-CM | POA: Diagnosis not present

## 2018-06-20 DIAGNOSIS — Z86711 Personal history of pulmonary embolism: Secondary | ICD-10-CM | POA: Diagnosis not present

## 2018-06-20 DIAGNOSIS — R8290 Unspecified abnormal findings in urine: Secondary | ICD-10-CM | POA: Diagnosis not present

## 2018-06-20 DIAGNOSIS — Z6841 Body Mass Index (BMI) 40.0 and over, adult: Secondary | ICD-10-CM | POA: Diagnosis not present

## 2018-06-20 DIAGNOSIS — E1165 Type 2 diabetes mellitus with hyperglycemia: Secondary | ICD-10-CM | POA: Diagnosis not present

## 2018-06-20 DIAGNOSIS — M5416 Radiculopathy, lumbar region: Secondary | ICD-10-CM | POA: Diagnosis not present

## 2018-06-20 DIAGNOSIS — N390 Urinary tract infection, site not specified: Secondary | ICD-10-CM | POA: Diagnosis not present

## 2018-06-20 DIAGNOSIS — Z7901 Long term (current) use of anticoagulants: Secondary | ICD-10-CM | POA: Diagnosis not present

## 2018-06-26 DIAGNOSIS — E1165 Type 2 diabetes mellitus with hyperglycemia: Secondary | ICD-10-CM | POA: Diagnosis not present

## 2018-06-26 DIAGNOSIS — M5136 Other intervertebral disc degeneration, lumbar region: Secondary | ICD-10-CM | POA: Diagnosis not present

## 2018-06-26 DIAGNOSIS — Z6841 Body Mass Index (BMI) 40.0 and over, adult: Secondary | ICD-10-CM | POA: Diagnosis not present

## 2018-06-26 DIAGNOSIS — Z7901 Long term (current) use of anticoagulants: Secondary | ICD-10-CM | POA: Diagnosis not present

## 2018-06-26 DIAGNOSIS — I10 Essential (primary) hypertension: Secondary | ICD-10-CM | POA: Diagnosis not present

## 2018-06-26 DIAGNOSIS — Z0000 Encounter for general adult medical examination without abnormal findings: Secondary | ICD-10-CM | POA: Diagnosis not present

## 2018-06-28 ENCOUNTER — Other Ambulatory Visit: Payer: Self-pay

## 2018-06-28 ENCOUNTER — Ambulatory Visit: Payer: PPO | Attending: Pain Medicine | Admitting: Pain Medicine

## 2018-06-28 ENCOUNTER — Encounter: Payer: Self-pay | Admitting: Pain Medicine

## 2018-06-28 VITALS — BP 138/68 | HR 89 | Temp 97.4°F | Ht 63.0 in | Wt 213.0 lb

## 2018-06-28 DIAGNOSIS — M533 Sacrococcygeal disorders, not elsewhere classified: Secondary | ICD-10-CM | POA: Insufficient documentation

## 2018-06-28 DIAGNOSIS — Z6841 Body Mass Index (BMI) 40.0 and over, adult: Secondary | ICD-10-CM | POA: Insufficient documentation

## 2018-06-28 DIAGNOSIS — M79604 Pain in right leg: Secondary | ICD-10-CM | POA: Diagnosis not present

## 2018-06-28 DIAGNOSIS — E1169 Type 2 diabetes mellitus with other specified complication: Secondary | ICD-10-CM | POA: Insufficient documentation

## 2018-06-28 DIAGNOSIS — G8929 Other chronic pain: Secondary | ICD-10-CM | POA: Insufficient documentation

## 2018-06-28 DIAGNOSIS — E669 Obesity, unspecified: Secondary | ICD-10-CM | POA: Insufficient documentation

## 2018-06-28 DIAGNOSIS — M47816 Spondylosis without myelopathy or radiculopathy, lumbar region: Secondary | ICD-10-CM | POA: Insufficient documentation

## 2018-06-28 DIAGNOSIS — M79605 Pain in left leg: Secondary | ICD-10-CM | POA: Insufficient documentation

## 2018-06-28 DIAGNOSIS — M545 Low back pain: Secondary | ICD-10-CM

## 2018-06-28 DIAGNOSIS — Z7901 Long term (current) use of anticoagulants: Secondary | ICD-10-CM | POA: Diagnosis not present

## 2018-06-28 DIAGNOSIS — M4310 Spondylolisthesis, site unspecified: Secondary | ICD-10-CM | POA: Insufficient documentation

## 2018-06-28 DIAGNOSIS — M4306 Spondylolysis, lumbar region: Secondary | ICD-10-CM | POA: Diagnosis not present

## 2018-06-28 DIAGNOSIS — E1142 Type 2 diabetes mellitus with diabetic polyneuropathy: Secondary | ICD-10-CM | POA: Diagnosis not present

## 2018-06-28 NOTE — Progress Notes (Signed)
Nursing Pain Medication Assessment:  Safety precautions to be maintained throughout the outpatient stay will include: orient to surroundings, keep bed in low position, maintain call bell within reach at all times, provide assistance with transfer out of bed and ambulation.  Medication Inspection Compliance: Ms. Sozio did not comply with our request to bring her pills to be counted. She was reminded that bringing the medication bottles, even when empty, is a requirement.  Medication: None brought in. Pill/Patch Count: None available to be counted. Bottle Appearance: No container available. Did not bring bottle(s) to appointment. Filled Date: N/A Last Medication intake:  Ran out of medicine more than 48 hours ago

## 2018-06-28 NOTE — Patient Instructions (Addendum)
______________________________________________________________________________________________  Specialty Pain Scale  Introduction:  There are significant differences in how pain is reported. The word pain usually refers to physical pain, but it is also a common synonym of suffering. The medical community uses a scale from 0 (zero) to 10 (ten) to report pain level. Zero (0) is described as "no pain", while ten (10) is described as "the worse pain you can imagine". The problem with this scale is that physical pain is reported along with suffering. Suffering refers to mental pain, or more often yet it refers to any unpleasant feeling, emotion or aversion associated with the perception of harm or threat of harm. It is the psychological component of pain.  Pain Specialists prefer to separate the two components. The pain scale used by this practice is the Verbal Numerical Rating Scale (VNRS-11). This scale is for the physical pain only. DO NOT INCLUDE how your pain psychologically affects you. This scale is for adults 21 years of age and older. It has 11 (eleven) levels. The 1st level is 0/10. This means: "right now, I have no pain". In the context of pain management, it also means: "right now, my physical pain is under control with the current therapy".  General Information:  The scale should reflect your current level of pain. Unless you are specifically asked for the level of your worst pain, or your average pain. If you are asked for one of these two, then it should be understood that it is over the past 24 hours.  Levels 1 (one) through 5 (five) are described below, and can be treated as an outpatient. Ambulatory pain management facilities such as ours are more than adequate to treat these levels. Levels 6 (six) through 10 (ten) are also described below, however, these must be treated as a hospitalized patient. While levels 6 (six) and 7 (seven) may be evaluated at an urgent care facility, levels 8  (eight) through 10 (ten) constitute medical emergencies and as such, they belong in a hospital's emergency department. When having these levels (as described below), do not come to our office. Our facility is not equipped to manage these levels. Go directly to an urgent care facility or an emergency department to be evaluated.  Definitions:  Activities of Daily Living (ADL): Activities of daily living (ADL or ADLs) is a term used in healthcare to refer to people's daily self-care activities. Health professionals often use a person's ability or inability to perform ADLs as a measurement of their functional status, particularly in regard to people post injury, with disabilities and the elderly. There are two ADL levels: Basic and Instrumental. Basic Activities of Daily Living (BADL  or BADLs) consist of self-care tasks that include: Bathing and showering; personal hygiene and grooming (including brushing/combing/styling hair); dressing; Toilet hygiene (getting to the toilet, cleaning oneself, and getting back up); eating and self-feeding (not including cooking or chewing and swallowing); functional mobility, often referred to as "transferring", as measured by the ability to walk, get in and out of bed, and get into and out of a chair; the broader definition (moving from one place to another while performing activities) is useful for people with different physical abilities who are still able to get around independently. Basic ADLs include the things many people do when they get up in the morning and get ready to go out of the house: get out of bed, go to the toilet, bathe, dress, groom, and eat. On the average, loss of function typically follows a particular order.   Hygiene is the first to go, followed by loss of toilet use and locomotion. The last to go is the ability to eat. When there is only one remaining area in which the person is independent, there is a 62.9% chance that it is eating and only a 3.5% chance  that it is hygiene. Instrumental Activities of Daily Living (IADL or IADLs) are not necessary for fundamental functioning, but they let an individual live independently in a community. IADL consist of tasks that include: cleaning and maintaining the house; home establishment and maintenance; care of others (including selecting and supervising caregivers); care of pets; child rearing; managing money; managing financials (investments, etc.); meal preparation and cleanup; shopping for groceries and necessities; moving within the community; safety procedures and emergency responses; health management and maintenance (taking prescribed medications); and using the telephone or other form of communication.  Instructions:  Most patients tend to report their pain as a combination of two factors, their physical pain and their psychosocial pain. This last one is also known as "suffering" and it is reflection of how physical pain affects you socially and psychologically. From now on, report them separately.  From this point on, when asked to report your pain level, report only your physical pain. Use the following table for reference.  Pain Clinic Pain Levels (0-5/10)  Pain Level Score  Description  No Pain 0   Mild pain 1 Nagging, annoying, but does not interfere with basic activities of daily living (ADL). Patients are able to eat, bathe, get dressed, toileting (being able to get on and off the toilet and perform personal hygiene functions), transfer (move in and out of bed or a chair without assistance), and maintain continence (able to control bladder and bowel functions). Blood pressure and heart rate are unaffected. A normal heart rate for a healthy adult ranges from 60 to 100 bpm (beats per minute).   Mild to moderate pain 2 Noticeable and distracting. Impossible to hide from other people. More frequent flare-ups. Still possible to adapt and function close to normal. It can be very annoying and may have  occasional stronger flare-ups. With discipline, patients may get used to it and adapt.   Moderate pain 3 Interferes significantly with activities of daily living (ADL). It becomes difficult to feed, bathe, get dressed, get on and off the toilet or to perform personal hygiene functions. Difficult to get in and out of bed or a chair without assistance. Very distracting. With effort, it can be ignored when deeply involved in activities.   Moderately severe pain 4 Impossible to ignore for more than a few minutes. With effort, patients may still be able to manage work or participate in some social activities. Very difficult to concentrate. Signs of autonomic nervous system discharge are evident: dilated pupils (mydriasis); mild sweating (diaphoresis); sleep interference. Heart rate becomes elevated (>115 bpm). Diastolic blood pressure (lower number) rises above 100 mmHg. Patients find relief in laying down and not moving.   Severe pain 5 Intense and extremely unpleasant. Associated with frowning face and frequent crying. Pain overwhelms the senses.  Ability to do any activity or maintain social relationships becomes significantly limited. Conversation becomes difficult. Pacing back and forth is common, as getting into a comfortable position is nearly impossible. Pain wakes you up from deep sleep. Physical signs will be obvious: pupillary dilation; increased sweating; goosebumps; brisk reflexes; cold, clammy hands and feet; nausea, vomiting or dry heaves; loss of appetite; significant sleep disturbance with inability to fall asleep or to   remain asleep. When persistent, significant weight loss is observed due to the complete loss of appetite and sleep deprivation.  Blood pressure and heart rate becomes significantly elevated. Caution: If elevated blood pressure triggers a pounding headache associated with blurred vision, then the patient should immediately seek attention at an urgent or emergency care unit, as  these may be signs of an impending stroke.    Emergency Department Pain Levels (6-10/10)  Emergency Room Pain 6 Severely limiting. Requires emergency care and should not be seen or managed at an outpatient pain management facility. Communication becomes difficult and requires great effort. Assistance to reach the emergency department may be required. Facial flushing and profuse sweating along with potentially dangerous increases in heart rate and blood pressure will be evident.   Distressing pain 7 Self-care is very difficult. Assistance is required to transport, or use restroom. Assistance to reach the emergency department will be required. Tasks requiring coordination, such as bathing and getting dressed become very difficult.   Disabling pain 8 Self-care is no longer possible. At this level, pain is disabling. The individual is unable to do even the most "basic" activities such as walking, eating, bathing, dressing, transferring to a bed, or toileting. Fine motor skills are lost. It is difficult to think clearly.   Incapacitating pain 9 Pain becomes incapacitating. Thought processing is no longer possible. Difficult to remember your own name. Control of movement and coordination are lost.   The worst pain imaginable 10 At this level, most patients pass out from pain. When this level is reached, collapse of the autonomic nervous system occurs, leading to a sudden drop in blood pressure and heart rate. This in turn results in a temporary and dramatic drop in blood flow to the brain, leading to a loss of consciousness. Fainting is one of the body's self defense mechanisms. Passing out puts the brain in a calmed state and causes it to shut down for a while, in order to begin the healing process.    Summary: 1. Refer to this scale when providing [place] with your pain level. 2. Be accurate and careful when reporting your pain level. This will help with your care. 3. Over-reporting your pain level will  lead to loss of credibility. 4. Even a level of 1/10 means that there is pain and will be treated at our facility. 5. High, inaccurate reporting will be documented as "Symptom Exaggeration", leading to loss of credibility and suspicions of possible secondary gains such as obtaining more narcotics, or wanting to appear disabled, for fraudulent reasons. 6. Only pain levels of 5 or below will be seen at our facility. 7. Pain levels of 6 and above will be sent to the Emergency Department and the appointment cancelled. ______________________________________________________________________________________________   ____________________________________________________________________________________________  Preparing for your procedure (without sedation)  Instructions: . Oral Intake: Do not eat or drink anything for at least 3 hours prior to your procedure. . Transportation: Unless otherwise stated by your physician, you may drive yourself after the procedure. . Blood Pressure Medicine: Take your blood pressure medicine with a sip of water the morning of the procedure. . Blood thinners: Notify our staff if you are taking any blood thinners. Depending on which one you take, there will be specific instructions on how and when to stop it. . Diabetics on insulin: Notify the staff so that you can be scheduled 1st case in the morning. If your diabetes requires high dose insulin, take only  of your normal insulin dose the morning  of the procedure and notify the staff that you have done so. . Preventing infections: Shower with an antibacterial soap the morning of your procedure.  . Build-up your immune system: Take 1000 mg of Vitamin C with every meal (3 times a day) the day prior to your procedure. Marland Kitchen Antibiotics: Inform the staff if you have a condition or reason that requires you to take antibiotics before dental procedures. . Pregnancy: If you are pregnant, call and cancel the procedure. . Sickness: If  you have a cold, fever, or any active infections, call and cancel the procedure. . Arrival: You must be in the facility at least 30 minutes prior to your scheduled procedure. . Children: Do not bring any children with you. . Dress appropriately: Bring dark clothing that you would not mind if they get stained. . Valuables: Do not bring any jewelry or valuables.  Procedure appointments are reserved for interventional treatments only. Marland Kitchen No Prescription Refills. . No medication changes will be discussed during procedure appointments. . No disability issues will be discussed.  Reasons to call and reschedule or cancel your procedure: (Following these recommendations will minimize the risk of a serious complication.) . Surgeries: Avoid having procedures within 2 weeks of any surgery. (Avoid for 2 weeks before or after any surgery). . Flu Shots: Avoid having procedures within 2 weeks of a flu shots or . (Avoid for 2 weeks before or after immunizations). . Barium: Avoid having a procedure within 7-10 days after having had a radiological study involving the use of radiological contrast. (Myelograms, Barium swallow or enema study). . Heart attacks: Avoid any elective procedures or surgeries for the initial 6 months after a "Myocardial Infarction" (Heart Attack). . Blood thinners: It is imperative that you stop these medications before procedures. Let us know if you if you take any blood thinner.  . Infection: Avoid procedures during or within two weeks of an infection (including chest colds or gastrointestinal problems). Symptoms associated with infections include: Localized redness, fever, chills, night sweats or profuse sweating, burning sensation when voiding, cough, congestion, stuffiness, runny nose, sore throat, diarrhea, nausea, vomiting, cold or Flu symptoms, recent or current infections. It is specially important if the infection is over the area that we intend to treat. Marland Kitchen Heart and lung problems:  Symptoms that may suggest an active cardiopulmonary problem include: cough, chest pain, breathing difficulties or shortness of breath, dizziness, ankle swelling, uncontrolled high or unusually low blood pressure, and/or palpitations. If you are experiencing any of these symptoms, cancel your procedure and contact your primary care physician for an evaluation.  Remember:  Regular Business hours are:  Monday to Thursday 8:00 AM to 4:00 PM  Provider's Schedule: Milinda Pointer, MD:  Procedure days: Tuesday and Thursday 7:30 AM to 4:00 PM  Gillis Santa, MD:  Procedure days: Monday and Wednesday 7:30 AM to 4:00 PM ____________________________________________________________________________________________   GENERAL RISKS AND COMPLICATIONS  What are the risk, side effects and possible complications? Generally speaking, most procedures are safe.  However, with any procedure there are risks, side effects, and the possibility of complications.  The risks and complications are dependent upon the sites that are lesioned, or the type of nerve block to be performed.  The closer the procedure is to the spine, the more serious the risks are.  Great care is taken when placing the radio frequency needles, block needles or lesioning probes, but sometimes complications can occur. 1. Infection: Any time there is an injection through the skin, there is a  risk of infection.  This is why sterile conditions are used for these blocks.  There are four possible types of infection. 1. Localized skin infection. 2. Central Nervous System Infection-This can be in the form of Meningitis, which can be deadly. 3. Epidural Infections-This can be in the form of an epidural abscess, which can cause pressure inside of the spine, causing compression of the spinal cord with subsequent paralysis. This would require an emergency surgery to decompress, and there are no guarantees that the patient would recover from the  paralysis. 4. Discitis-This is an infection of the intervertebral discs.  It occurs in about 1% of discography procedures.  It is difficult to treat and it may lead to surgery.        2. Pain: the needles have to go through skin and soft tissues, will cause soreness.       3. Damage to internal structures:  The nerves to be lesioned may be near blood vessels or    other nerves which can be potentially damaged.       4. Bleeding: Bleeding is more common if the patient is taking blood thinners such as  aspirin, Coumadin, Ticiid, Plavix, etc., or if he/she have some genetic predisposition  such as hemophilia. Bleeding into the spinal canal can cause compression of the spinal  cord with subsequent paralysis.  This would require an emergency surgery to  decompress and there are no guarantees that the patient would recover from the  paralysis.       5. Pneumothorax:  Puncturing of a lung is a possibility, every time a needle is introduced in  the area of the chest or upper back.  Pneumothorax refers to free air around the  collapsed lung(s), inside of the thoracic cavity (chest cavity).  Another two possible  complications related to a similar event would include: Hemothorax and Chylothorax.   These are variations of the Pneumothorax, where instead of air around the collapsed  lung(s), you may have blood or chyle, respectively.       6. Spinal headaches: They may occur with any procedures in the area of the spine.       7. Persistent CSF (Cerebro-Spinal Fluid) leakage: This is a rare problem, but may occur  with prolonged intrathecal or epidural catheters either due to the formation of a fistulous  track or a dural tear.       8. Nerve damage: By working so close to the spinal cord, there is always a possibility of  nerve damage, which could be as serious as a permanent spinal cord injury with  paralysis.       9. Death:  Although rare, severe deadly allergic reactions known as "Anaphylactic  reaction" can  occur to any of the medications used.      10. Worsening of the symptoms:  We can always make thing worse.  What are the chances of something like this happening? Chances of any of this occuring are extremely low.  By statistics, you have more of a chance of getting killed in a motor vehicle accident: while driving to the hospital than any of the above occurring .  Nevertheless, you should be aware that they are possibilities.  In general, it is similar to taking a shower.  Everybody knows that you can slip, hit your head and get killed.  Does that mean that you should not shower again?  Nevertheless always keep in mind that statistics do not mean anything if you happen  to be on the wrong side of them.  Even if a procedure has a 1 (one) in a 1,000,000 (million) chance of going wrong, it you happen to be that one..Also, keep in mind that by statistics, you have more of a chance of having something go wrong when taking medications.  Who should not have this procedure? If you are on a blood thinning medication (e.g. Coumadin, Plavix, see list of "Blood Thinners"), or if you have an active infection going on, you should not have the procedure.  If you are taking any blood thinners, please inform your physician.  How should I prepare for this procedure?  Do not eat or drink anything at least six hours prior to the procedure.  Bring a driver with you .  It cannot be a taxi.  Come accompanied by an adult that can drive you back, and that is strong enough to help you if your legs get weak or numb from the local anesthetic.  Take all of your medicines the morning of the procedure with just enough water to swallow them.  If you have diabetes, make sure that you are scheduled to have your procedure done first thing in the morning, whenever possible.  If you have diabetes, take only half of your insulin dose and notify our nurse that you have done so as soon as you arrive at the clinic.  If you are  diabetic, but only take blood sugar pills (oral hypoglycemic), then do not take them on the morning of your procedure.  You may take them after you have had the procedure.  Do not take aspirin or any aspirin-containing medications, at least eleven (11) days prior to the procedure.  They may prolong bleeding.  Wear loose fitting clothing that may be easy to take off and that you would not mind if it got stained with Betadine or blood.  Do not wear any jewelry or perfume  Remove any nail coloring.  It will interfere with some of our monitoring equipment.  NOTE: Remember that this is not meant to be interpreted as a complete list of all possible complications.  Unforeseen problems may occur.  BLOOD THINNERS The following drugs contain aspirin or other products, which can cause increased bleeding during surgery and should not be taken for 2 weeks prior to and 1 week after surgery.  If you should need take something for relief of minor pain, you may take acetaminophen which is found in Tylenol,m Datril, Anacin-3 and Panadol. It is not blood thinner. The products listed below are.  Do not take any of the products listed below in addition to any listed on your instruction sheet.  A.P.C or A.P.C with Codeine Codeine Phosphate Capsules #3 Ibuprofen Ridaura  ABC compound Congesprin Imuran rimadil  Advil Cope Indocin Robaxisal  Alka-Seltzer Effervescent Pain Reliever and Antacid Coricidin or Coricidin-D  Indomethacin Rufen  Alka-Seltzer plus Cold Medicine Cosprin Ketoprofen S-A-C Tablets  Anacin Analgesic Tablets or Capsules Coumadin Korlgesic Salflex  Anacin Extra Strength Analgesic tablets or capsules CP-2 Tablets Lanoril Salicylate  Anaprox Cuprimine Capsules Levenox Salocol  Anexsia-D Dalteparin Magan Salsalate  Anodynos Darvon compound Magnesium Salicylate Sine-off  Ansaid Dasin Capsules Magsal Sodium Salicylate  Anturane Depen Capsules Marnal Soma  APF Arthritis pain formula Dewitt's Pills  Measurin Stanback  Argesic Dia-Gesic Meclofenamic Sulfinpyrazone  Arthritis Bayer Timed Release Aspirin Diclofenac Meclomen Sulindac  Arthritis pain formula Anacin Dicumarol Medipren Supac  Analgesic (Safety coated) Arthralgen Diffunasal Mefanamic Suprofen  Arthritis Strength Bufferin Dihydrocodeine Mepro  Compound Suprol  Arthropan liquid Dopirydamole Methcarbomol with Aspirin Synalgos  ASA tablets/Enseals Disalcid Micrainin Tagament  Ascriptin Doan's Midol Talwin  Ascriptin A/D Dolene Mobidin Tanderil  Ascriptin Extra Strength Dolobid Moblgesic Ticlid  Ascriptin with Codeine Doloprin or Doloprin with Codeine Momentum Tolectin  Asperbuf Duoprin Mono-gesic Trendar  Aspergum Duradyne Motrin or Motrin IB Triminicin  Aspirin plain, buffered or enteric coated Durasal Myochrisine Trigesic  Aspirin Suppositories Easprin Nalfon Trillsate  Aspirin with Codeine Ecotrin Regular or Extra Strength Naprosyn Uracel  Atromid-S Efficin Naproxen Ursinus  Auranofin Capsules Elmiron Neocylate Vanquish  Axotal Emagrin Norgesic Verin  Azathioprine Empirin or Empirin with Codeine Normiflo Vitamin E  Azolid Emprazil Nuprin Voltaren  Bayer Aspirin plain, buffered or children's or timed BC Tablets or powders Encaprin Orgaran Warfarin Sodium  Buff-a-Comp Enoxaparin Orudis Zorpin  Buff-a-Comp with Codeine Equegesic Os-Cal-Gesic   Buffaprin Excedrin plain, buffered or Extra Strength Oxalid   Bufferin Arthritis Strength Feldene Oxphenbutazone   Bufferin plain or Extra Strength Feldene Capsules Oxycodone with Aspirin   Bufferin with Codeine Fenoprofen Fenoprofen Pabalate or Pabalate-SF   Buffets II Flogesic Panagesic   Buffinol plain or Extra Strength Florinal or Florinal with Codeine Panwarfarin   Buf-Tabs Flurbiprofen Penicillamine   Butalbital Compound Four-way cold tablets Penicillin   Butazolidin Fragmin Pepto-Bismol   Carbenicillin Geminisyn Percodan   Carna Arthritis Reliever Geopen Persantine    Carprofen Gold's salt Persistin   Chloramphenicol Goody's Phenylbutazone   Chloromycetin Haltrain Piroxlcam   Clmetidine heparin Plaquenil   Cllnoril Hyco-pap Ponstel   Clofibrate Hydroxy chloroquine Propoxyphen         Before stopping any of these medications, be sure to consult the physician who ordered them.  Some, such as Coumadin (Warfarin) are ordered to prevent or treat serious conditions such as "deep thrombosis", "pumonary embolisms", and other heart problems.  The amount of time that you may need off of the medication may also vary with the medication and the reason for which you were taking it.  If you are taking any of these medications, please make sure you notify your pain physician before you undergo any procedures.   Sacroiliac (SI) Joint Injection Patient Information  Description: The sacroiliac joint connects the scrum (very low back and tailbone) to the ilium (a pelvic bone which also forms half of the hip joint).  Normally this joint experiences very little motion.  When this joint becomes inflamed or unstable low back and or hip and pelvis pain may result.  Injection of this joint with local anesthetics (numbing medicines) and steroids can provide diagnostic information and reduce pain.  This injection is performed with the aid of x-ray guidance into the tailbone area while you are lying on your stomach.   You may experience an electrical sensation down the leg while this is being done.  You may also experience numbness.  We also may ask if we are reproducing your normal pain during the injection.  Conditions which may be treated SI injection:   Low back, buttock, hip or leg pain  Preparation for the Injection:  1. Do not eat any solid food or dairy products within 8 hours of your appointment.  2. You may drink clear liquids up to 3 hours before appointment.  Clear liquids include water, black coffee, juice or soda.  No milk or cream please. 3. You may take your  regular medications, including pain medications with a sip of water before your appointment.  Diabetics should hold regular insulin (if take separately) and take 1/2  normal NPH dose the morning of the procedure.  Carry some sugar containing items with you to your appointment. 4. A driver must accompany you and be prepared to drive you home after your procedure. 5. Bring all of your current medications with you. 6. An IV may be inserted and sedation may be given at the discretion of the physician. 7. A blood pressure cuff, EKG and other monitors will often be applied during the procedure.  Some patients may need to have extra oxygen administered for a short period.  8. You will be asked to provide medical information, including your allergies, prior to the procedure.  We must know immediately if you are taking blood thinners (like Coumadin/Warfarin) or if you are allergic to IV iodine contrast (dye).  We must know if you could possible be pregnant.  Possible side effects:   Bleeding from needle site  Infection (rare, may require surgery)  Nerve injury (rare)  Numbness & tingling (temporary)  A brief convulsion or seizure  Light-headedness (temporary)  Pain at injection site (several days)  Decreased blood pressure (temporary)  Weakness in the leg (temporary)   Call if you experience:   New onset weakness or numbness of an extremity below the injection site that last more than 8 hours.  Hives or difficulty breathing ( go to the emergency room)  Inflammation or drainage at the injection site  Any new symptoms which are concerning to you  Please note:  Although the local anesthetic injected can often make your back/ hip/ buttock/ leg feel good for several hours after the injections, the pain will likely return.  It takes 3-7 days for steroids to work in the sacroiliac area.  You may not notice any pain relief for at least that one week.  If effective, we will often do a series  of three injections spaced 3-6 weeks apart to maximally decrease your pain.  After the initial series, we generally will wait some months before a repeat injection of the same type.  If you have any questions, please call [phone] [facility]  Facet Blocks Patient Information  Description: The facets are joints in the spine between the vertebrae.  Like any joints in the body, facets can become irritated and painful.  Arthritis can also effect the facets.  By injecting steroids and local anesthetic in and around these joints, we can temporarily block the nerve supply to them.  Steroids act directly on irritated nerves and tissues to reduce selling and inflammation which often leads to decreased pain.  Facet blocks may be done anywhere along the spine from the neck to the low back depending upon the location of your pain.   After numbing the skin with local anesthetic (like Novocaine), a small needle is passed onto the facet joints under x-ray guidance.  You may experience a sensation of pressure while this is being done.  The entire block usually lasts about 15-25 minutes.   Conditions which may be treated by facet blocks:   Low back/buttock pain  Neck/shoulder pain  Certain types of headaches  Preparation for the injection:  10. Do not eat any solid food or dairy products within 8 hours of your appointment. 11. You may drink clear liquid up to 3 hours before appointment.  Clear liquids include water, black coffee, juice or soda.  No milk or cream please. 12. You may take your regular medication, including pain medications, with a sip of water before your appointment.  Diabetics should hold regular insulin (if taken separately) and take 1/2 normal NPH dose the morning of the procedure.  Carry some sugar containing items with you to your appointment. 13. A driver must accompany you and be prepared to drive you home after your procedure. 22. Bring all  your current medications with you. 15. An IV may be inserted and sedation may be given at the discretion of the physician. 16. A blood pressure cuff, EKG and other monitors will often be applied during the procedure.  Some patients may need to have extra oxygen administered for a short period. 98. You will be asked to provide medical information, including your allergies and medications, prior to the procedure.  We must know immediately if you are taking blood thinners (like Coumadin/Warfarin) or if you are allergic to IV iodine contrast (dye).  We must know if you could possible be pregnant.  Possible side-effects:   Bleeding from needle site  Infection (rare, may require surgery)  Nerve injury (rare)  Numbness & tingling (temporary)  Difficulty urinating (rare, temporary)  Spinal headache (a headache worse with upright posture)  Light-headedness (temporary)  Pain at injection site (serveral days)  Decreased blood pressure (rare, temporary)  Weakness in arm/leg (temporary)  Pressure sensation in back/neck (temporary)   Call if you experience:   Fever/chills associated with headache or increased back/neck pain  Headache worsened by an upright position  New onset, weakness or numbness of an extremity below the injection site  Hives or difficulty breathing (go to the emergency room)  Inflammation or drainage at the injection site(s)  Severe back/neck pain greater than usual  New symptoms which are concerning to you  Please note:  Although the local anesthetic injected can often make your back or neck feel good for several hours after the injection, the pain will likely return. It takes 3-7 days for steroids to work.  You may not notice any pain relief for at least one week.  If effective, we will often do a series of 2-3 injections spaced 3-6 weeks apart to maximally decrease your pain.  After the initial series, you may be a candidate for a more permanent nerve  block of the facets.  If you have any questions, please call #[phone]) [facility]

## 2018-06-28 NOTE — Progress Notes (Signed)
Patient's Name: Kara [name]  MRN: [id number]  Referring Provider: Tracie Harrier, MD  DOB: Jun 23, 1950  PCP: Tracie Harrier, MD  DOS: 06/28/2018  Note by: Gaspar Cola, MD  Service setting: Ambulatory outpatient  Specialty: Interventional Pain Management  Location: [name] (AMB) Pain Management Facility    Patient type: Established   Primary Reason(s) for Visit: Encounter for post-procedure evaluation of chronic illness with mild to moderate exacerbation CC: Back Pain  HPI  Kara [name] is a 68 y.o. year old, female patient, who comes today for a post-procedure evaluation. She has Diabetes mellitus type 2 in obese ([place]); Type 2 diabetes mellitus without complication, without long-term current use of insulin ([place]); BP (high blood pressure); Nocturnal leg cramps; Cough; Chronic hip pain (Left); Acute non-recurrent maxillary sinusitis; Diverticulitis large intestine; Diverticulitis; DDD (degenerative disc disease), lumbar; Benign essential hypertension; Edema of both legs; Acute on chronic systolic (congestive) heart failure ([place]); HCAP (healthcare-associated pneumonia); Encounter for removal of staples; Other pulmonary embolism without acute cor pulmonale ([place]); History of colostomy reversal; Ileostomy in place [facility]); Positive result for methicillin resistant Staphylococcus aureus (MRSA) screening; H/O ileostomy; Diverticulosis of colon; History of pulmonary embolism; S/P IVC filter; Chronic low back pain (Primary Area of Pain) (Bilateral) w/ sciatica (Bilateral); Chronic sacroiliac joint pain (Right); Chronic pain syndrome; Long term current use of opiate analgesic; Pharmacologic therapy; Disorder of skeletal system; Problems influencing health status; Hyponatremia; Hypocalcemia; 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-S1 anterolisthesis.; Lumbar foraminal stenosis (L3-4 and L4-5) (Bilateral); Lumbosacral L5-S1 subarticular lateral recess stenosis (Bilateral); Lumbar facet arthropathy (L3-4,  L4-5, and L5-S1) (Bilateral); Lumbar facet syndrome (Bilateral); 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); Lumbar pars defect (L3 and L4) (Bilateral); Diabetic peripheral neuropathy ([place]); Chronic lower extremity pain (Secondary Area of Pain) (Bilateral); Chronic radicular pain of lower extremity; Osteoarthritis of hip (Right); History of allergy to latex; Chronic low back pain (Primary Area of Pain) (Bilateral); Spondylosis without myelopathy or radiculopathy, lumbosacral region; Other specified dorsopathies, sacral and sacrococcygeal region; Morbid obesity with BMI of 45.0-49.9, adult ([place]); Secondary osteoarthritis of multiple sites; Sacral insufficiency fracture; DDD (degenerative disc disease), lumbosacral; Chronic anticoagulation ([place]); Acute deep vein thrombosis (DVT) of distal end of right lower extremity ([place]); and Coccygodynia on their problem list. Her primarily concern today is the Back Pain  Pain Assessment: Location: Lower Back Radiating: Denies Onset: More than a month ago Duration: Chronic pain Quality: Dull, Sharp Severity: 10-Worst pain ever/10 (subjective, self-reported pain score)  Note: Reported level is inconsistent with clinical observations. Clinically the patient looks like a 3/10 A 3/10 is viewed as "Moderate" and described as significantly interfering with activities of daily living (ADL). It becomes difficult to feed, bathe, get dressed, get on and off the toilet or to perform personal hygiene functions. Difficult to get in and out of bed or a chair without assistance. Very distracting. With effort, it can be ignored when deeply involved in activities. Kara [name] does not seem to understand the use of our objective pain scale When using our objective Pain Scale, levels between 6 and 10/10 are said to belong in an emergency room, as it progressively worsens from a 6/10, described as severely limiting, requiring emergency care not usually available  at an outpatient pain management facility. At a 6/10 level, communication becomes difficult and requires great effort. Assistance to reach the emergency department may be required. Facial flushing and profuse sweating along with potentially dangerous increases in heart rate and blood pressure will be evident. Effect on ADL: unable  to do anything Timing: Constant Modifying factors: laying on right side, medications BP: 138/68  HR: 89  [name] comes in today for post-procedure evaluation.  Further details on both, my assessment(s), as well as the proposed treatment plan, please see below.  Post-Procedure Assessment  06/19/2018 Procedure: Diagnostic/therapeutic caudal ESI #3 under fluoroscopic guidance, no sedation Pre-procedure pain score:  8/10 Post-procedure pain score: 1/10         Influential Factors: BMI: 37.73 kg/m Intra-procedural challenges: None observed.         Assessment challenges: None detected.              Reported side-effects: None.        Post-procedural adverse reactions or complications: None reported         Sedation: No sedation used. When no sedatives are used, the analgesic levels obtained are directly associated to the effectiveness of the local anesthetics. However, when sedation is provided, the level of analgesia obtained during the initial 1 hour following the intervention, is believed to be the result of a combination of factors. These factors may include, but are not limited to: 1. The effectiveness of the local anesthetics used. 2. The effects of the analgesic(s) and/or anxiolytic(s) used. 3. The degree of discomfort experienced by the patient at the time of the procedure. 4. The patients ability and reliability in recalling and recording the events. 5. The presence and influence of possible secondary gains and/or psychosocial factors. Reported result: Relief experienced during the 1st hour after the procedure: 100 % (Ultra-Short Term Relief)             Interpretative annotation: Clinically appropriate result. No IV Analgesic or Anxiolytic given, therefore benefits are completely due to Local Anesthetic effects.          Effects of local anesthetic: The analgesic effects attained during this period are directly associated to the localized infiltration of local anesthetics and therefore cary significant diagnostic value as to the etiological location, or anatomical origin, of the pain. Expected duration of relief is directly dependent on the pharmacodynamics of the local anesthetic used. Long-acting (4-6 hours) anesthetics used.  Reported result: Relief during the next 4 to 6 hour after the procedure: 100 % (Short-Term Relief)            Interpretative annotation: Clinically appropriate result. Analgesia during this period is likely to be Local Anesthetic-related.          Long-term benefit: Defined as the period of time past the expected duration of local anesthetics (1 hour for short-acting and 4-6 hours for long-acting). With the possible exception of prolonged sympathetic blockade from the local anesthetics, benefits during this period are typically attributed to, or associated with, other factors such as analgesic sensory neuropraxia, antiinflammatory effects, or beneficial biochemical changes provided by agents other than the local anesthetics.  Reported result: Extended relief following procedure: 100 %(2 to 3 days) (Long-Term Relief)            Interpretative annotation: Clinically possible results. Good relief. No permanent benefit expected. Inflammation plays a part in the etiology to the pain.          Current benefits: Defined as reported results that persistent at this point in time.   Analgesia: 0 %  Function: Back to baseline ROM: Back to baseline Interpretative annotation: Recurrence of symptoms. Limited therapeutic benefit. Results would suggest persistent aggravating factors.          Interpretation: Results would suggest a  successful diagnostic  intervention.                  Plan:  Please see "Plan of Care" for details.                Laboratory Chemistry  Inflammation Markers (CRP: Acute Phase) (ESR: Chronic Phase) Lab Results  Component Value Date   CRP 5.3 (H) 11/02/2017   ESRSEDRATE 12 11/02/2017                         Rheumatology Markers No results found.  Renal Markers Lab Results  Component Value Date   BUN 19 10/03/2017   CREATININE 0.63 10/03/2017   GFRAA >60 10/03/2017   GFRNONAA >60 10/03/2017                             Hepatic Markers Lab Results  Component Value Date   AST 40 04/21/2017   ALT 53 04/21/2017   ALBUMIN 3.8 04/21/2017                        Neuropathy Markers Lab Results  Component Value Date   VITAMINB12 367 11/02/2017   HGBA1C 7.2 (H) 02/17/2017   HIV Non Reactive 11/18/2016                        Hematology Parameters Lab Results  Component Value Date   PLT 200 10/03/2017   HGB 16.5 (H) 10/03/2017   HCT 49.7 (H) 10/03/2017                        CV Markers Lab Results  Component Value Date   BNP 60.0 11/17/2016   CKTOTAL 71 10/02/2011   CKMB < 0.5 (L) 10/02/2011   TROPONINI <0.03 11/17/2016                         Note: Lab results reviewed.  Recent Imaging Results   Results for orders placed in visit on 06/08/18  DG C-Arm 1-60 Min-No Report   Narrative Fluoroscopy was utilized by the requesting physician.  No radiographic  interpretation.    Interpretation Report: Fluoroscopy was used during the procedure to assist with needle guidance. The images were interpreted intraoperatively by the requesting physician.  Meds   Current Outpatient Medications:  .  aspirin EC 81 MG tablet, Take 81 mg by mouth daily., Disp: , Rfl:  .  candesartan (ATACAND) 16 MG tablet, Take 8 mg by mouth daily. , Disp: , Rfl:  .  cyclobenzaprine (FLEXERIL) 10 MG tablet, Take 10 mg by mouth at bedtime. , Disp: , Rfl:  .  gabapentin (NEURONTIN) 100 MG capsule,  Take 200 mg by mouth 3 (three) times daily. , Disp: , Rfl:  .  glipiZIDE (GLUCOTROL XL) 5 MG 24 hr tablet, Take 5 mg by mouth daily with breakfast. , Disp: , Rfl:  .  HYDROcodone-acetaminophen (NORCO/VICODIN) 5-325 MG tablet, Take 1 tablet by mouth 2 (two) times daily as needed for up to 30 days for severe pain. Must last 30 days., Disp: 60 tablet, Rfl: 0 .  meclizine (ANTIVERT) 25 MG tablet, , Disp: , Rfl:  .  Multiple Vitamin (MULTI-VITAMINS) TABS, Take 1 tablet by mouth daily., Disp: , Rfl:  .  rOPINIRole (REQUIP) 0.5 MG tablet, Take 0.5 mg by mouth at bedtime.,  Disp: , Rfl:  .  warfarin (COUMADIN) 6 MG tablet, Take 6 mg by mouth daily., Disp: , Rfl:   ROS  Constitutional: Denies any fever or chills Gastrointestinal: No reported hemesis, hematochezia, vomiting, or acute GI distress Musculoskeletal: Denies any acute onset joint swelling, redness, loss of ROM, or weakness Neurological: No reported episodes of acute onset apraxia, aphasia, dysarthria, agnosia, amnesia, paralysis, loss of coordination, or loss of consciousness  Allergies  Ms. Oliveria is allergic to adhesive [tape]; other; latex; and morphine and related.  PFSH  Drug: Ms. Schoonmaker  reports no history of drug use. Alcohol:  reports no history of alcohol use. Tobacco:  reports that she has never smoked. She has never used smokeless tobacco. 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 Surgical: Ms. Lye  has a past surgical history that includes Knee surgery; Carpal tunnel release (Bilateral); Colon resection sigmoid (N/A, 11/02/2016); Colostomy (N/A, 11/02/2016); Sigmoidoscopy (N/A, 11/13/2016); PULMONARY VENOGRAPHY (N/A, 11/19/2016); Colonoscopy with propofol (N/A, 02/03/2017); Colon surgery (11/02/2016); IVC FILTER  INSERTION (Right, 10/2016); Colostomy reversal (N/A, 02/24/2017); Appendectomy (N/A, 02/24/2017); Lysis of adhesion (N/A, 02/24/2017); laparoscopy (N/A, 02/24/2017); Ileo loop diversion (N/A, 02/24/2017); Sigmoidoscopy (N/A, 05/19/2017); Ileostomy closure (N/A, 06/01/2017); IVC FILTER REMOVAL (N/A, 08/09/2017); and Sacroplasty (N/A, 11/08/2017). Family: family history includes Breast cancer in her cousin and mother; Cancer in her mother; Diabetes in her mother and sister; Heart disease in her father, mother, and sister.  Constitutional Exam  General appearance: Well nourished, well developed, and well hydrated. In no apparent acute distress Vitals:   06/28/18 0905  BP: 138/68  Pulse: 89  Temp: (!) 97.4 F (36.3 C)  SpO2: 95%  Weight: 213 lb (96.6 kg)  Height: '5\' 3"'  (1.6 m)   BMI Assessment: Estimated body mass index is 37.73 kg/m as calculated from the following:   Height as of this encounter: '5\' 3"'  (1.6 m).   Weight as of this encounter: 213 lb (96.6 kg).  BMI interpretation table: BMI level Category Range association with higher incidence of chronic pain  <18 kg/m2 Underweight   18.5-24.9 kg/m2 Ideal body weight   25-29.9 kg/m2 Overweight Increased incidence by 20%  30-34.9 kg/m2 Obese (Class I) Increased incidence by 68%  35-39.9 kg/m2 Severe obesity (Class II) Increased incidence by 136%  >40 kg/m2 Extreme obesity (Class III) Increased incidence by 254%   Patient's current BMI Ideal Body weight  Body mass index is 37.73 kg/m. Ideal body weight: 52.4 kg (115 lb 8.3 oz) Adjusted ideal body weight: 70.1 kg (154 lb 8.2 oz)   BMI Readings from Last 4 Encounters:  06/28/18 37.73 kg/m  06/08/18 37.38 kg/m  05/09/18 38.62 kg/m  04/11/18 39.15 kg/m   Wt Readings from Last 4 Encounters:  06/28/18 213 lb (96.6 kg)  06/08/18 211 lb (95.7 kg)  05/09/18 218 lb (98.9 kg)  04/11/18 221 lb (100.2 kg)  Psych/Mental status: Alert, oriented x 3 (person, place, & time)       Eyes:  PERLA Respiratory: No evidence of acute respiratory distress  Cervical Spine Area Exam  Skin & Axial Inspection: No masses, redness, edema, swelling, or associated skin lesions Alignment: Symmetrical Functional ROM: Unrestricted ROM      Stability: No instability detected Muscle Tone/Strength: Functionally intact. No obvious neuro-muscular anomalies detected. Sensory (Neurological): Unimpaired Palpation: No palpable anomalies  Upper Extremity (UE) Exam    Side: Right upper extremity  Side: Left upper extremity  Skin & Extremity Inspection: Skin color, temperature, and hair growth are WNL. No peripheral edema or cyanosis. No masses, redness, swelling, asymmetry, or associated skin lesions. No contractures.  Skin & Extremity Inspection: Skin color, temperature, and hair growth are WNL. No peripheral edema or cyanosis. No masses, redness, swelling, asymmetry, or associated skin lesions. No contractures.  Functional ROM: Unrestricted ROM          Functional ROM: Unrestricted ROM          Muscle Tone/Strength: Functionally intact. No obvious neuro-muscular anomalies detected.  Muscle Tone/Strength: Functionally intact. No obvious neuro-muscular anomalies detected.  Sensory (Neurological): Unimpaired          Sensory (Neurological): Unimpaired          Palpation: No palpable anomalies              Palpation: No palpable anomalies              Provocative Test(s):  Phalen's test: deferred Tinel's test: deferred Apley's scratch test (touch opposite shoulder):  Action 1 (Across chest): deferred Action 2 (Overhead): deferred Action 3 (LB reach): deferred   Provocative Test(s):  Phalen's test: deferred Tinel's test: deferred Apley's scratch test (touch opposite shoulder):  Action 1 (Across chest): deferred Action 2 (Overhead): deferred Action 3 (LB reach): deferred    Thoracic Spine Area Exam  Skin & Axial Inspection: No masses, redness, or swelling Alignment:  Symmetrical Functional ROM: Unrestricted ROM Stability: No instability detected Muscle Tone/Strength: Functionally intact. No obvious neuro-muscular anomalies detected. Sensory (Neurological): Unimpaired Muscle strength & Tone: No palpable anomalies  Lumbar Spine Area Exam  Skin & Axial Inspection: No masses, redness, or swelling Alignment: Symmetrical Functional ROM: Decreased ROM affecting primarily the right Stability: No instability detected Muscle Tone/Strength: Increased muscle tone over affected area Sensory (Neurological): Movement-associated pain Palpation: Complains of area being tender to palpation       Provocative Tests: Hyperextension/rotation test: (+) on the right for facet joint pain. Lumbar quadrant test (Kemp's test): (+) on the right for facet joint pain. Lateral bending test: deferred today       Patrick's Maneuver: (+) for right-sided S-I arthralgia             FABER* test: (+) for left-sided S-I arthralgia             S-I anterior distraction/compression test: deferred today         S-I lateral compression test: deferred today         S-I Thigh-thrust test: deferred today         S-I Gaenslen's test: deferred today         *(Flexion, ABduction and External Rotation)  Gait & Posture Assessment  Ambulation: Patient ambulates using a cane Gait: Significantly limited. Dependent on assistive device to ambulate Posture: Difficulty standing up straight, due to pain   Lower Extremity Exam    Side: Right lower extremity  Side: Left lower extremity  Stability: No instability observed          Stability: No instability observed          Skin & Extremity Inspection: Skin color, temperature, and hair growth are WNL. No peripheral edema or cyanosis. No masses, redness, swelling, asymmetry, or associated skin lesions. No contractures.  Skin & Extremity Inspection: Skin color, temperature, and hair growth are WNL. No peripheral edema or cyanosis. No masses, redness,  swelling,  asymmetry, or associated skin lesions. No contractures.  Functional ROM: Unrestricted ROM                  Functional ROM: Unrestricted ROM                  Muscle Tone/Strength: Functionally intact. No obvious neuro-muscular anomalies detected.  Muscle Tone/Strength: Functionally intact. No obvious neuro-muscular anomalies detected.  Sensory (Neurological): Unimpaired        Sensory (Neurological): Unimpaired        DTR: Patellar: deferred today Achilles: deferred today Plantar: deferred today  DTR: Patellar: deferred today Achilles: deferred today Plantar: deferred today  Palpation: No palpable anomalies  Palpation: No palpable anomalies   Assessment  Primary Diagnosis & Pertinent Problem List: The primary encounter diagnosis was Coccygodynia. 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  Status Diagnosis  Controlled Worsening Controlled 1. Coccygodynia   2. Chronic low back pain (Primary Area of Pain) (Bilateral)   3. Chronic lower extremity pain (Secondary Area of Pain) (Bilateral)   4. 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   5. 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 Diabetes mellitus type 2 in obese ([facility])   7. Lumbar facet syndrome (Bilateral)   8. Chronic sacroiliac joint pain (Right)   9. Diabetic peripheral neuropathy ([place])   10. Morbid obesity with BMI of 45.0-49.9, adult ([place])   11. Chronic anticoagulation ([place])     Problems updated and reviewed during this visit: No problems  updated. Plan of Care  Pharmacotherapy (Medications Ordered): No orders of the defined types were placed in this encounter.  Medications administered today: [name] had no medications administered during this visit.   Procedure Orders     LUMBAR FACET(MEDIAL BRANCH NERVE BLOCK) MBNB     SACROILIAC JOINT INJECTION Lab Orders  No laboratory test(s) ordered today    Imaging Orders     DG Lumbar Spine Complete W/Bend  Referral Orders     Amb Referral to Bariatric Surgery     Amb Ref to Medical Weight Management Interventional management options: Planned, scheduled, and/or pending:   NOTE:NO RFAuntil BMI is <35. [facility] ANTICOAGULATION (Stop: 5 days before proc.; Re-start: 2hrs after proc.) Diagnostic right lumbar facet block#4+ Diagnostic right-sided sacroiliac joint block under fluoro, no sedation.   Considering:   Diagnostic bilateral lumbar facet block#4 Possible bilateral lumbar facet RFA Therapeutic left-sided L4-5 interlaminar LESI #2 Diagnostic L5-S1 LESI Diagnostic bilateral L3transforaminal ESI Diagnostic bilateral L4transforaminal ESI Diagnostic bilateral L5 transforaminal ESI Diagnostic right-sided sacroiliac joint block Possible right-sided sacroiliac joint RFA Diagnostic bilateral lumbar facet block Possible bilateral lumbar facet RFA Diagnostic right-sided intra-articular hip joint injection Diagnostic right-sided femoral nerve +Obturatornerve block Possible right-sided femoral nerve + obturator RFA Diagnostic (Midline) caudal ESI #2   Palliative PRN treatment(s):   Palliativebilateral lumbar facet block(s) Palliative/Therapeutic (Midline) caudal ESI #4   Provider-requested follow-up: Return for Procedure (no sedation): (R) L-FCT + (R) SI BLK #1, (Blood-thinner Protocol).  Future Appointments  Date Time Provider [place]  07/04/2018 10:15 AM Milinda Pointer, MD [facility] None   Primary Care  Physician: Tracie Harrier, MD Location: [facility] Note by: Gaspar Cola, MD Date: 06/28/2018; Time: 10:15 AM

## 2018-07-04 ENCOUNTER — Ambulatory Visit
Admission: RE | Admit: 2018-07-04 | Discharge: 2018-07-04 | Disposition: A | Discharge status: Home / Self Care | Payer: PPO | Source: Ambulatory Visit | Attending: Pain Medicine | Admitting: Pain Medicine

## 2018-07-04 ENCOUNTER — Other Ambulatory Visit: Payer: Self-pay

## 2018-07-04 ENCOUNTER — Encounter: Payer: Self-pay | Admitting: Pain Medicine

## 2018-07-04 ENCOUNTER — Ambulatory Visit (HOSPITAL_BASED_OUTPATIENT_CLINIC_OR_DEPARTMENT_OTHER): Payer: PPO | Admitting: Pain Medicine

## 2018-07-04 VITALS — BP 100/58 | HR 86 | Temp 97.5°F | Resp 16 | Ht 63.0 in | Wt 213.0 lb

## 2018-07-04 DIAGNOSIS — G8929 Other chronic pain: Secondary | ICD-10-CM | POA: Diagnosis not present

## 2018-07-04 DIAGNOSIS — M4306 Spondylolysis, lumbar region: Secondary | ICD-10-CM

## 2018-07-04 DIAGNOSIS — M4310 Spondylolisthesis, site unspecified: Secondary | ICD-10-CM | POA: Diagnosis not present

## 2018-07-04 DIAGNOSIS — M47816 Spondylosis without myelopathy or radiculopathy, lumbar region: Secondary | ICD-10-CM

## 2018-07-04 DIAGNOSIS — M47817 Spondylosis without myelopathy or radiculopathy, lumbosacral region: Secondary | ICD-10-CM | POA: Diagnosis not present

## 2018-07-04 DIAGNOSIS — M5388 Other specified dorsopathies, sacral and sacrococcygeal region: Secondary | ICD-10-CM | POA: Insufficient documentation

## 2018-07-04 DIAGNOSIS — Z7901 Long term (current) use of anticoagulants: Secondary | ICD-10-CM | POA: Insufficient documentation

## 2018-07-04 DIAGNOSIS — M533 Sacrococcygeal disorders, not elsewhere classified: Secondary | ICD-10-CM

## 2018-07-04 DIAGNOSIS — Z6841 Body Mass Index (BMI) 40.0 and over, adult: Secondary | ICD-10-CM | POA: Diagnosis not present

## 2018-07-04 DIAGNOSIS — M4300 Spondylolysis, site unspecified: Secondary | ICD-10-CM | POA: Diagnosis not present

## 2018-07-04 MED ORDER — ROPIVACAINE HCL 2 MG/ML IJ SOLN
9.0000 mL | Freq: Once | INTRAMUSCULAR | Status: AC
  Administered 2018-07-04: 9 mL via PERINEURAL
  Filled 2018-07-04: qty 10

## 2018-07-04 MED ORDER — LIDOCAINE HCL 2 % IJ SOLN
20.0000 mL | Freq: Once | INTRAMUSCULAR | Status: AC
  Administered 2018-07-04: 400 mg
  Filled 2018-07-04: qty 40

## 2018-07-04 MED ORDER — TRIAMCINOLONE ACETONIDE 40 MG/ML IJ SUSP
40.0000 mg | Freq: Once | INTRAMUSCULAR | Status: AC
  Administered 2018-07-04: 40 mg
  Filled 2018-07-04: qty 1

## 2018-07-04 MED ORDER — METHYLPREDNISOLONE ACETATE 80 MG/ML IJ SUSP
80.0000 mg | Freq: Once | INTRAMUSCULAR | Status: AC
  Administered 2018-07-04: 80 mg via INTRA_ARTICULAR
  Filled 2018-07-04: qty 1

## 2018-07-04 MED ORDER — ROPIVACAINE HCL 2 MG/ML IJ SOLN
4.0000 mL | Freq: Once | INTRAMUSCULAR | Status: AC
  Administered 2018-07-04: 4 mL via INTRA_ARTICULAR
  Filled 2018-07-04: qty 10

## 2018-07-04 NOTE — Patient Instructions (Addendum)
____________________________________________________________________________________________  Post-Procedure Discharge Instructions  Instructions:  Apply ice: Fill a plastic sandwich bag with crushed ice. Cover it with a small towel and apply to injection site. Apply for 15 minutes then remove x 15 minutes. Repeat sequence on day of procedure, until you go to bed. The purpose is to minimize swelling and discomfort after procedure.  Apply heat: Apply heat to procedure site starting the day following the procedure. The purpose is to treat any soreness and discomfort from the procedure.  Food intake: Start with clear liquids (like water) and advance to regular food, as tolerated.   Physical activities: Keep activities to a minimum for the first 8 hours after the procedure.   Driving: If you have received any sedation, you are not allowed to drive for 24 hours after your procedure.  Blood thinner: Restart your blood thinner 6 hours after your procedure. (Only for those taking blood thinners)  Insulin: As soon as you can eat, you may resume your normal dosing schedule. (Only for those taking insulin)  Infection prevention: Keep procedure site clean and dry.  Post-procedure Pain Diary: Extremely important that this be done correctly and accurately. Recorded information will be used to determine the next step in treatment.  Pain evaluated is that of treated area only. Do not include pain from an untreated area.  Complete every hour, on the hour, for the initial 8 hours. Set an alarm to help you do this part accurately.  Do not go to sleep and have it completed later. It will not be accurate.  Follow-up appointment: Keep your follow-up appointment after the procedure. Usually 2 weeks for most procedures. (6 weeks in the case of radiofrequency.) Bring you pain diary.   Expect:  From numbing medicine (AKA: Local Anesthetics): Numbness or decrease in pain.  Onset: Full effect within 15  minutes of injected.  Duration: It will depend on the type of local anesthetic used. On the average, 1 to 8 hours.   From steroids: Decrease in swelling or inflammation. Once inflammation is improved, relief of the pain will follow.  Onset of benefits: Depends on the amount of swelling present. The more swelling, the longer it will take for the benefits to be seen. In some cases, up to 10 days.  Duration: Steroids will stay in the system x 2 weeks. Duration of benefits will depend on multiple posibilities including persistent irritating factors.  Occasional side-effects: Facial flushing (red, warm cheeks) , cramps (if present, drink Gatorade and take over-the-counter Magnesium 450-500 mg once to twice a day).  From procedure: Some discomfort is to be expected once the numbing medicine wears off. This should be minimal if ice and heat are applied as instructed.  Call if:  You experience numbness and weakness that gets worse with time, as opposed to wearing off.  New onset bowel or bladder incontinence. (This applies to Spinal procedures only)  Emergency Numbers:  Durning business hours (Monday - Thursday, 8:00 AM - 4:00 PM) (Friday, 9:00 AM - 12:00 Noon): [phone]  After hours: [phone] ____________________________________________________________________________________________   Pain Management Discharge Instructions  General Discharge Instructions :  If you need to reach your doctor call: Monday-Friday 8:00 am - 4:00 pm at [phone] or toll free [phone].  After clinic hours [phone] to have operator reach doctor.  Bring all of your medication bottles to all your appointments in the pain clinic.  To cancel or reschedule your appointment with Pain Management please remember to call 24 hours in advance to  avoid a fee.  Refer to the educational materials which you have been given on: General Risks, I had my Procedure. Discharge Instructions, Post  Sedation.  Post Procedure Instructions:  The drugs you were given will stay in your system until tomorrow, so for the next 24 hours you should not drive, make any legal decisions or drink any alcoholic beverages.  You may eat anything you prefer, but it is better to start with liquids then soups and crackers, and gradually work up to solid foods.  Please notify your doctor immediately if you have any unusual bleeding, trouble breathing or pain that is not related to your normal pain.  Depending on the type of procedure that was done, some parts of your body may feel week and/or numb.  This usually clears up by tonight or the next day.  Walk with the use of an assistive device or accompanied by an adult for the 24 hours.  You may use ice on the affected area for the first 24 hours.  Put ice in a Ziploc bag and cover with a towel and place against area 15 minutes on 15 minutes off.  You may switch to heat after 24 hours.

## 2018-07-04 NOTE — Progress Notes (Signed)
Safety precautions to be maintained throughout the outpatient stay will include: orient to surroundings, keep bed in low position, maintain call bell within reach at all times, provide assistance with transfer out of bed and ambulation.  

## 2018-07-04 NOTE — Progress Notes (Addendum)
Patient's Name: Kara [name]  MRN: [id number]  Referring Provider: Tracie Harrier, MD  DOB: [birth year]  PCP: Tracie Harrier, MD  DOS: 07/04/2018  Note by: Gaspar Cola, MD  Service setting: Ambulatory outpatient  Specialty: Interventional Pain Management  Patient type: Established  Location: [name] (AMB) Pain Management Facility  Visit type: Interventional Procedure   Primary Reason for Visit: Interventional Pain Management Treatment. CC: Back Pain (low right)  Procedure:          Anesthesia, Analgesia, Anxiolysis:  Procedure #1: Type: Medial Branch Facet Block #4 Primary Purpose: Diagnostic Region: Lumbar Level: L2, L3, L4, L5, & S1 Medial Branch Level(s) Target Area: For Lumbar Facet blocks, the target is the groove formed by the junction of the transverse process and superior articular process. For the L5 dorsal ramus, the target is the notch between superior articular process and sacral ala. For the S1 dorsal ramus, the target is the superior and lateral edge of the posterior S1 Sacral foramen. Approach: Posterior, paramedial, percutaneous approach. Laterality: Right  Procedure #2: Type: Sacroiliac Joint Block  #1  Primary Purpose: Diagnostic Region: Posterior Lumbosacral Level: PSIS (Posterior Superior Iliac Spine) Sacroiliac Joint Target Area: For upper sacroiliac joint block(s), the target is the superior and posterior margin of the sacroiliac joint. Approach: Ipsilateral approach. Laterality: Right  Type: Local Anesthesia Indication(s): Analgesia         Route: Infiltration ([place]/IM) IV Access: Declined Sedation: Declined  Local Anesthetic: Lidocaine 1-2%  Position: Prone   Indications: 1. Spondylosis without myelopathy or radiculopathy, lumbosacral region   2. Lumbar facet syndrome (Bilateral)   3. Lumbar facet arthropathy (L3-4, L4-5, and L5-S1) (Bilateral)   4. 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 Other specified dorsopathies, sacral and sacrococcygeal region   7. Chronic sacroiliac joint pain (Right)   8. Chronic anticoagulation (COUMADIN)   9. Morbid obesity with BMI of 45.0-49.9, adult (HCC)    Pain Score: Pre-procedure: 9 /10 Post-procedure: 0-No pain/10  Pre-op Assessment:  Kara [name] is a 68 y.o. (year old), female patient, seen today for interventional treatment. She  has a past surgical history that includes Knee surgery; Carpal tunnel release (Bilateral); Colon resection sigmoid (N/A, 11/02/2016); Colostomy (N/A, 11/02/2016); Sigmoidoscopy (N/A, 11/13/2016); PULMONARY VENOGRAPHY (N/A, 11/19/2016); Colonoscopy with propofol (N/A, 02/03/2017); Colon surgery (11/02/2016); IVC FILTER INSERTION (Right, 10/2016); Colostomy reversal (N/A, 02/24/2017); Appendectomy (N/A, 02/24/2017); Lysis of adhesion (N/A, 02/24/2017); laparoscopy (N/A, 02/24/2017); [place] loop diversion (N/A, 02/24/2017); Sigmoidoscopy (N/A, 05/19/2017); Ileostomy closure (N/A, 06/01/2017); IVC FILTER REMOVAL (N/A, 08/09/2017); and Sacroplasty (N/A, 11/08/2017). Kara [name] has a current medication list which includes the following prescription(s): aspirin ec, candesartan, cyclobenzaprine, gabapentin, glipizide, hydrocodone-acetaminophen, meclizine, multi-vitamins, ropinirole, and warfarin. Her primarily concern today is the Back Pain (low right)  Initial Vital Signs:  Pulse/HCG Rate: 86ECG Heart Rate: 84 Temp: (!) 97.5 F (36.4 C) Resp: 18 BP: 131/68 SpO2: 97 %  BMI: Estimated body mass index is 37.73 kg/m as calculated from the following:   Height as of this encounter: 5\' 3"  (1.6 m).   Weight as of this encounter: 213 lb (96.6 kg).  Risk Assessment: Allergies: Reviewed. She is allergic to adhesive [tape]; other; latex; and morphine and related.  Allergy Precautions: None required Coagulopathies: Reviewed. None identified.  Blood-thinner therapy: None at this time Active  Infection(s): Reviewed. None identified. Kara [name] is afebrile  Site Confirmation: Kara [name] was asked to confirm the procedure and laterality  before marking the site Procedure checklist: Completed Consent: Before the procedure and under the influence of no sedative(s), amnesic(s), or anxiolytics, the patient was informed of the treatment options, risks and possible complications. To fulfill our ethical and legal obligations, as recommended by the American Medical Association's Code of Ethics, I have informed the patient of my clinical impression; the nature and purpose of the treatment or procedure; the risks, benefits, and possible complications of the intervention; the alternatives, including doing nothing; the risk(s) and benefit(s) of the alternative treatment(s) or procedure(s); and the risk(s) and benefit(s) of doing nothing. The patient was provided information about the general risks and possible complications associated with the procedure. These may include, but are not limited to: failure to achieve desired goals, infection, bleeding, organ or nerve damage, allergic reactions, paralysis, and death. In addition, the patient was informed of those risks and complications associated to Spine-related procedures, such as failure to decrease pain; infection (i.e.: Meningitis, epidural or intraspinal abscess); bleeding (i.e.: epidural hematoma, subarachnoid hemorrhage, or any other type of intraspinal or peri-dural bleeding); organ or nerve damage (i.e.: Any type of peripheral nerve, nerve root, or spinal cord injury) with subsequent damage to sensory, motor, and/or autonomic systems, resulting in permanent pain, numbness, and/or weakness of one or several areas of the body; allergic reactions; (i.e.: anaphylactic reaction); and/or death. Furthermore, the patient was informed of those risks and complications associated with the medications. These include, but are not limited to:  allergic reactions (i.e.: anaphylactic or anaphylactoid reaction(s)); adrenal axis suppression; blood sugar elevation that in diabetics may result in ketoacidosis or comma; water retention that in patients with history of congestive heart failure may result in shortness of breath, pulmonary edema, and decompensation with resultant heart failure; weight gain; swelling or edema; medication-induced neural toxicity; particulate matter embolism and blood vessel occlusion with resultant organ, and/or nervous system infarction; and/or aseptic necrosis of one or more joints. Finally, the patient was informed that Medicine is not an exact science; therefore, there is also the possibility of unforeseen or unpredictable risks and/or possible complications that may result in a catastrophic outcome. The patient indicated having understood very clearly. We have given the patient no guarantees and we have made no promises. Enough time was given to the patient to ask questions, all of which were answered to the patient's satisfaction. Ms. Franchini has indicated that she wanted to continue with the procedure. Attestation: I, the ordering provider, attest that I have discussed with the patient the benefits, risks, side-effects, alternatives, likelihood of achieving goals, and potential problems during recovery for the procedure that I have provided informed consent. Date  Time: 07/04/2018  9:58 AM  Pre-Procedure Preparation:  Monitoring: As per clinic protocol. Respiration, ETCO2, SpO2, BP, heart rate and rhythm monitor placed and checked for adequate function Safety Precautions: Patient was assessed for positional comfort and pressure points before starting the procedure. Time-out: I initiated and conducted the "Time-out" before starting the procedure, as per protocol. The patient was asked to participate by confirming the accuracy of the "Time Out" information. Verification of the correct person, site, and procedure  were performed and confirmed by me, the nursing staff, and the patient. "Time-out" conducted as per Joint Commission's Universal Protocol (UP.01.01.01). Time: 1049  Description of Procedure #1:   Time-out: "Time-out" completed before starting procedure, as per protocol. Area Prepped: Entire Posterior Lumbosacral Region Prepping solution: ChloraPrep (2% chlorhexidine gluconate and 70% isopropyl alcohol) Safety Precautions: Aspiration looking for blood return was conducted prior to all  injections. At no point did we inject any substances, as a needle was being advanced. No attempts were made at seeking any paresthesias. Safe injection practices and needle disposal techniques used. Medications properly checked for expiration dates. SDV (single dose vial) medications used.  Description of the Procedure: Protocol guidelines were followed. The patient was placed in position over the fluoroscopy table. The target area was identified and the area prepped in the usual manner. Skin & deeper tissues infiltrated with local anesthetic. Appropriate amount of time allowed to pass for local anesthetics to take effect. The procedure needle was introduced through the skin, ipsilateral to the reported pain, and advanced to the target area. Employing the "Medial Branch Technique", the needles were advanced to the angle made by the superior and medial portion of the transverse process, and the lateral and inferior portion of the superior articulating process of the targeted vertebral bodies. This area is known as "Burton's Eye" or the "Eye of the [place] Dog". A procedure needle was introduced through the skin, and this time advanced to the angle made by the superior and medial border of the sacral ala, and the lateral border of the S1 vertebral body. This last needle was later repositioned at the superior and lateral border of the posterior S1 foramen. Negative aspiration confirmed. Solution injected in intermittent fashion,  asking for systemic symptoms every 0.5cc of injectate. The needles were then removed and the area cleansed, making sure to leave some of the prepping solution back to take advantage of its long term bactericidal properties. Start Time: 1049 hrs. Materials:  Needle(s) Type: Spinal Needle Gauge: 22G Length: 7-in Medication(s): Please see orders for medications and dosing details.  Description of Procedure # 2:   Area Prepped: Entire Posterior Lumbosacral Region Prepping solution: ChloraPrep (2% chlorhexidine gluconate and 70% isopropyl alcohol) Safety Precautions: Aspiration looking for blood return was conducted prior to all injections. At no point did we inject any substances, as a needle was being advanced. No attempts were made at seeking any paresthesias. Safe injection practices and needle disposal techniques used. Medications properly checked for expiration dates. SDV (single dose vial) medications used. Description of the Procedure: Protocol guidelines were followed. The patient was placed in position over the fluoroscopy table. The target area was identified and the area prepped in the usual manner. Skin & deeper tissues infiltrated with local anesthetic. Appropriate amount of time allowed to pass for local anesthetics to take effect. The procedure needle was advanced under fluoroscopic guidance into the sacroiliac joint until a firm endpoint was obtained. Proper needle placement secured. Negative aspiration confirmed. Solution injected in intermittent fashion, asking for systemic symptoms every 0.5cc of injectate. The needles were then removed and the area cleansed, making sure to leave some of the prepping solution back to take advantage of its long term bactericidal properties. Vitals:   07/04/18 1046 07/04/18 1052 07/04/18 1057 07/04/18 1102  BP: 103/78 (!) 115/55 (!) 108/59 (!) 100/58  Pulse:      Resp: 20 15 16 16   Temp:      SpO2: 95% 94% 95% 95%  Weight:      Height:        End  Time: 1101 hrs. Materials:  Needle(s) Type: Spinal Needle Gauge: 22G Length: 7-in Medication(s): Please see orders for medications and dosing details.  Imaging Guidance (Spinal):          Type of Imaging Technique: Fluoroscopy Guidance (Spinal) Indication(s): Assistance in needle guidance and placement for procedures requiring needle placement  in or near specific anatomical locations not easily accessible without such assistance. Exposure Time: Please see nurses notes. Contrast: None used. Fluoroscopic Guidance: I was personally present during the use of fluoroscopy. "Tunnel Vision Technique" used to obtain the best possible view of the target area. Parallax error corrected before commencing the procedure. "Direction-depth-direction" technique used to introduce the needle under continuous pulsed fluoroscopy. Once target was reached, antero-posterior, oblique, and lateral fluoroscopic projection used confirm needle placement in all planes. Images permanently stored in EMR. Interpretation: No contrast injected. I personally interpreted the imaging intraoperatively. Adequate needle placement confirmed in multiple planes. Permanent images saved into the patient's record.  Antibiotic Prophylaxis:   Anti-infectives (From admission, onward)   None     Indication(s): None identified  Post-operative Assessment:  Post-procedure Vital Signs:  Pulse/HCG Rate: 8683 Temp: (!) 97.5 F (36.4 C) Resp: 16 BP: (!) 100/58 SpO2: 95 %  EBL: None  Complications: No immediate post-treatment complications observed by team, or reported by patient.  Note: The patient tolerated the entire procedure well. A repeat set of vitals were taken after the procedure and the patient was kept under observation following institutional policy, for this type of procedure. Post-procedural neurological assessment was performed, showing return to baseline, prior to discharge. The patient was provided with post-procedure  discharge instructions, including a section on how to identify potential problems. Should any problems arise concerning this procedure, the patient was given instructions to immediately contact us, at any time, without hesitation. In any case, we plan to contact the patient by telephone for a follow-up status report regarding this interventional procedure.  Comments:  No additional relevant information.  Plan of Care    Imaging Orders     DG C-Arm 1-60 Min-No Report  Procedure Orders     LUMBAR FACET(MEDIAL BRANCH NERVE BLOCK) MBNB     SACROILIAC JOINT INJECTION  Medications ordered for procedure: Meds ordered this encounter  Medications  . lidocaine (XYLOCAINE) 2 % (with pres) injection 400 mg  . ropivacaine (PF) 2 mg/mL (0.2%) (NAROPIN) injection 9 mL  . triamcinolone acetonide (KENALOG-40) injection 40 mg  . ropivacaine (PF) 2 mg/mL (0.2%) (NAROPIN) injection 4 mL  . methylPREDNISolone acetate (DEPO-MEDROL) injection 80 mg   Medications administered: We administered lidocaine, ropivacaine (PF) 2 mg/mL (0.2%), triamcinolone acetonide, ropivacaine (PF) 2 mg/mL (0.2%), and methylPREDNISolone acetate.  See the medical record for exact dosing, route, and time of administration.  Disposition: Discharge home  Discharge Date & Time: 07/04/2018; 1115 hrs.   Physician-requested Follow-up: Return for post-procedure eval (2 wks), w/ Dr. Dossie Arbour.  Future Appointments  Date Time Provider [place]  07/20/2018  9:00 AM Gloriajean Dell, RD [name] None  07/24/2018  8:15 AM Milinda Pointer, MD [facility] None   Primary Care Physician: Tracie Harrier, MD Location: [facility] Management Facility Note by: Gaspar Cola, MD Date: 07/04/2018; Time: 12:35 PM  Disclaimer:  Medicine is not an exact science. The only guarantee in medicine is that nothing is guaranteed. It is important to note that the decision to proceed with this intervention was based on the  information collected from the patient. The Data and conclusions were drawn from the patient's questionnaire, the interview, and the physical examination. Because the information was provided in large part by the patient, it cannot be guaranteed that it has not been purposely or unconsciously manipulated. Every effort has been made to obtain as much relevant data as possible for this evaluation. It is important to note that the  conclusions that lead to this procedure are derived in large part from the available data. Always take into account that the treatment will also be dependent on availability of resources and existing treatment guidelines, considered by other Pain Management Practitioners as being common knowledge and practice, at the time of the intervention. For Medico-Legal purposes, it is also important to point out that variation in procedural techniques and pharmacological choices are the acceptable norm. The indications, contraindications, technique, and results of the above procedure should only be interpreted and judged by a Board-Certified Interventional Pain Specialist with extensive familiarity and expertise in the same exact procedure and technique.

## 2018-07-05 ENCOUNTER — Telehealth: Payer: Self-pay | Admitting: *Deleted

## 2018-07-05 NOTE — Telephone Encounter (Signed)
Attempted to call for post procedure follow-up. Message left. 

## 2018-07-10 DIAGNOSIS — Z7901 Long term (current) use of anticoagulants: Secondary | ICD-10-CM | POA: Diagnosis not present

## 2018-07-18 IMAGING — XA DG C-ARM 61-120 MIN
1 series · 1 of 1 positions shown · non-contrast
Comparison: 09/22/2017.

CLINICAL DATA: Sacroplasty.

EXAM:
LUMBAR SPINE - 2-3 VIEW; DG C-ARM 61-120 MIN

[Series 1: cont. · 1 of 1 slices shown]
[im 1/1]
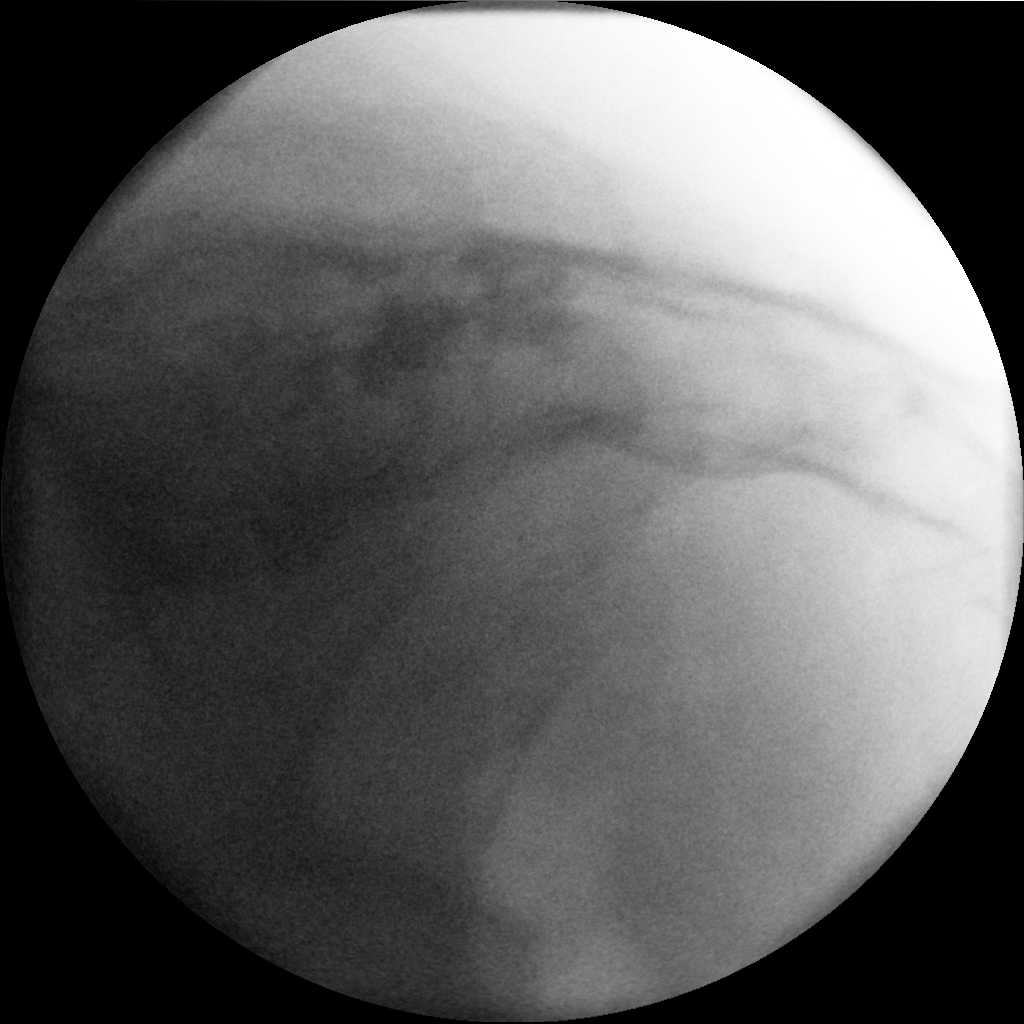

[1 of 1 positions shown; findings below may reference images not displayed]

FINDINGS: Prior bilateral sacroplasty.  No focal abnormality otherwise noted.
IMPRESSION: Prior bilateral sacroplasty.

## 2018-07-20 ENCOUNTER — Encounter: Payer: Self-pay | Admitting: Dietician

## 2018-07-20 ENCOUNTER — Encounter: Payer: PPO | Attending: Pain Medicine | Admitting: Dietician

## 2018-07-20 VITALS — Ht 63.0 in | Wt 227.0 lb

## 2018-07-20 DIAGNOSIS — Z6841 Body Mass Index (BMI) 40.0 and over, adult: Secondary | ICD-10-CM | POA: Diagnosis not present

## 2018-07-20 DIAGNOSIS — E119 Type 2 diabetes mellitus without complications: Secondary | ICD-10-CM | POA: Insufficient documentation

## 2018-07-20 DIAGNOSIS — Z713 Dietary counseling and surveillance: Secondary | ICD-10-CM | POA: Insufficient documentation

## 2018-07-20 NOTE — Patient Instructions (Signed)
Balance meals with 1-3 oz protein (6 oz daily), 1-2 starch servings and non-starchy vegetables. Can add a small serving of fruit to any meal. Refer to food guide plate to remind of balance needed and refer to "Planning a Balanced Meal" for portions. Add a protein food to carbohydrate for breakfast. Example: peanut butter added to 3 graham crackers. Increase intake of non-starchy vegetables. Try some of the armchair exercises (Refer to handout) Include 1 serving of carbohydrate and 1 oz protein for evening snack.

## 2018-07-20 NOTE — Progress Notes (Signed)
Medical Nutrition Therapy:  Visit start time: 9:00am   end time:10:00am  Assessment:   Diagnosis: Type 2 Diabetes Morbid obesity  Past medical history: hypertension, degenerative disc disease, history of diverticulitis/colon resection surgery  Psychosocial issues/ stress concerns:  Patient describes her stress as moderate and indicates "ok" as to how well she is dealing with her stress.  Preferred learning method:  . Hands-on  Current weight: 227 lbs      Height: 63 in Medications, supplements: see list  Progress and evaluation:  Patient in for initial medical nutrition therapy appointment. She reports that she lost 40 lbs in 2018 after colon surgery/complications due to diverticulitis. She then regained part 2/3's of weight back. She gives a goal weight of 170 lbs. In the past month she has made some positive changes in her diet, limiting bread, potatoes and rice. Her weight today is 6 lbs less than her documented weight of 233 lbs 3 weeks ago. Due to back pain she has limited physical activity. 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 She checks her glucose daily and reports FBG's have improved after increase in glipizide medication and most readings are less than 120. She takes her diabetes medication after waking up and drinks coffee and eats a cookie or 2 muffin "bites". She reports she sometimes has low blood sugar before her next meal at 11:00-12:00. Her present diet is possibly low in protein and is low in fruits/vegetables and fiber.  Patient is aware of food/coumadin interaction and foods to limit.  Dietary Intake:  Usual eating pattern includes 2 meals and 3 snacks per day. Dining out frequency: 4-6 meals per week.  Breakfast: 8:30am- Drinks 1 cup coffee and a cookie or 2 muffin "bites" Lunch: 11-12:00- scrambled egg, 1 slice toast, 1 slice bacon or shredded wheat cereal, 2% milk and 1/2 bagel Snack:  orange Supper: 5:30pm- salad with grilled chicken, crackers, water Snack: Sugar free jello Beverages: 6-7 bottles daily, coffee,  Nutrition Care Education:  Weight controlDiabetes:  Instructed patient on a meal plan based on 1400-1500 calories including identification of carbohydrate foods, protein foods and non-starchy vegetables as well as portions and how to better balance carbohydrate, protein and non-starchy vegetables. Gave and reviewed meal examples showing how to adjust her present food choices and amounts. Use food models and "Planning a Balanced Meal" as visuals. Also, gave an armchair exercise handout and reviewed exercise options to do while sitting that would not put pressure on her back. Discussed benefit of exercise for weight control and glucose control. Reviewed prevention and treatment of low blood sugar.  Nutritional Diagnosis:  NB-2.1 Physical inactivity As related to back pain and job that involves sitting 5 hours daily and soon to increase to 9 hours daily..  As evidenced by physical activity history..  Intervention:  Balance meals with 1-3 oz protein (6 oz daily), 1-2 starch servings and non-starchy vegetables. Can add a small serving of fruit to any meal. Refer to food guide plate to remind of balance needed and refer to "Planning a Balanced Meal" for portions. Add a protein food to carbohydrate for breakfast. Example: peanut butter added to 3 graham crackers. Increase intake of non-starchy vegetables. Try some of the armchair exercises (Refer to handout) Include 1 serving of carbohydrate and 1 oz protein for evening snack.  Education Materials given:  . Plate Planner . Food lists/ Planning A Balanced Meal . Sample meal pattern/ menus .  Snacking handout . Arm Chair exercises . Goals/ instructions  Learner/ who was taught:  . Patient   Level of understanding: . Partial understanding; needs review/ practice  Demonstrated degree of understanding via:   Teach  back Learning barriers: . None  Willingness to learn/ readiness for change: . Change in progress  Monitoring and Evaluation:   Patient did not schedule a follow-up appointment at this time. Gave my phone number and encouraged her to call if desires further help with her diet/nutrition.

## 2018-07-23 NOTE — Progress Notes (Signed)
Patient's Name: Kara [name]  MRN: [id number]  Referring Provider: Tracie Harrier, MD  DOB: [birth year]  PCP: Tracie Harrier, MD  DOS: 07/24/2018  Note by: Gaspar Cola, MD  Service setting: Ambulatory outpatient  Specialty: Interventional Pain Management  Location: [name] (AMB) Pain Management Facility    Patient type: Established   Primary Reason(s) for Visit: Encounter for post-procedure evaluation of chronic illness with mild to moderate exacerbation CC: Back Pain (low)  HPI  Kara [name] is a 68 y.o. year old, female patient, who comes today for a post-procedure evaluation. She has Diabetes mellitus type 2 in obese ([place]); Type 2 diabetes mellitus without complication, without long-term current use of insulin ([place]); BP (high blood pressure); Nocturnal leg cramps; Cough; Chronic hip pain (Left); Acute non-recurrent maxillary sinusitis; Diverticulitis large intestine; Diverticulitis; DDD (degenerative disc disease), lumbar; Benign essential hypertension; Edema of both legs; Acute on chronic systolic (congestive) heart failure ([place]); HCAP (healthcare-associated pneumonia); Encounter for removal of staples; Other pulmonary embolism without acute cor pulmonale ([place]); History of colostomy reversal; Ileostomy in place [facility]); Positive result for methicillin resistant Staphylococcus aureus (MRSA) screening; H/O ileostomy; Diverticulosis of colon; History of pulmonary embolism; S/P IVC filter; Chronic low back pain (Primary Area of Pain) (Bilateral) w/ sciatica (Bilateral); Chronic sacroiliac joint pain (Right); Chronic pain syndrome; Long term current use of opiate analgesic; Pharmacologic therapy; Disorder of skeletal system; Problems influencing health status; Hyponatremia; Hypocalcemia; 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-S1 anterolisthesis.; Lumbar foraminal stenosis (L3-4 and L4-5) (Bilateral); Lumbosacral L5-S1 subarticular lateral recess stenosis (Bilateral); Lumbar facet arthropathy  (L3-4, L4-5, and L5-S1) (Bilateral); Lumbar facet syndrome (Bilateral); 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); Lumbar pars defect (L3 and L4) (Bilateral); Diabetic peripheral neuropathy ([place]); Chronic lower extremity pain (Secondary Area of Pain) (Bilateral); Chronic radicular pain of lower extremity; Osteoarthritis of hip (Right); History of allergy to latex; Chronic low back pain (Primary Area of Pain) (Bilateral); Spondylosis without myelopathy or radiculopathy, lumbosacral region; Other specified dorsopathies, sacral and sacrococcygeal region; Morbid obesity with BMI of 45.0-49.9, adult ([place]); Secondary osteoarthritis of multiple sites; Sacral insufficiency fracture; DDD (degenerative disc disease), lumbosacral; Chronic anticoagulation ([place]); Acute deep vein thrombosis (DVT) of distal end of right lower extremity ([place]); and Coccygodynia on their problem list. Her primarily concern today is the Back Pain (low)  Pain Assessment: Location: Lower Back Radiating: denies Onset: More than a month ago Duration: Chronic pain Quality: Dull Severity: 2 /10 (subjective, self-reported pain score)  Note: Reported level is compatible with observation.                         When using our objective Pain Scale, levels between 6 and 10/10 are said to belong in an emergency room, as it progressively worsens from a 6/10, described as severely limiting, requiring emergency care not usually available at an outpatient pain management facility. At a 6/10 level, communication becomes difficult and requires great effort. Assistance to reach the emergency department may be required. Facial flushing and profuse sweating along with potentially dangerous increases in heart rate and blood pressure will be evident. Effect on ADL: limits activities Timing: Constant Modifying factors: procedures BP: (!) 142/73  HR: 83  Kara [name] comes in today for post-procedure evaluation.  Further  details on both, my assessment(s), as well as the proposed treatment plan, please see below.  Post-Procedure Assessment  07/04/2018 Procedure: Diagnostic right lumbar facet block#4+ Diagnostic right-sided sacroiliac joint block #1 under fluoro, no sedation. Pre-procedure pain  score:  9/10 Post-procedure pain score: 0/10 (100% relief) Influential Factors: BMI: 40.21 kg/m Intra-procedural challenges: None observed.         Assessment challenges: None detected.              Reported side-effects: None.        Post-procedural adverse reactions or complications: None reported         Sedation: Sedation provided. When no sedatives are used, the analgesic levels obtained are directly associated to the effectiveness of the local anesthetics. However, when sedation is provided, the level of analgesia obtained during the initial 1 hour following the intervention, is believed to be the result of a combination of factors. These factors may include, but are not limited to: 1. The effectiveness of the local anesthetics used. 2. The effects of the analgesic(s) and/or anxiolytic(s) used. 3. The degree of discomfort experienced by the patient at the time of the procedure. 4. The patients ability and reliability in recalling and recording the events. 5. The presence and influence of possible secondary gains and/or psychosocial factors. Reported result: Relief experienced during the 1st hour after the procedure: 100 % (Ultra-Short Term Relief)            Interpretative annotation: Clinically appropriate result. Analgesia during this period is likely to be Local Anesthetic and/or IV Sedative (Analgesic/Anxiolytic) related.          Effects of local anesthetic: The analgesic effects attained during this period are directly associated to the localized infiltration of local anesthetics and therefore cary significant diagnostic value as to the etiological location, or anatomical origin, of the pain. Expected  duration of relief is directly dependent on the pharmacodynamics of the local anesthetic used. Long-acting (4-6 hours) anesthetics used.  Reported result: Relief during the next 4 to 6 hour after the procedure: 100 % (Short-Term Relief)            Interpretative annotation: Clinically appropriate result. Analgesia during this period is likely to be Local Anesthetic-related.          Long-term benefit: Defined as the period of time past the expected duration of local anesthetics (1 hour for short-acting and 4-6 hours for long-acting). With the possible exception of prolonged sympathetic blockade from the local anesthetics, benefits during this period are typically attributed to, or associated with, other factors such as analgesic sensory neuropraxia, antiinflammatory effects, or beneficial biochemical changes provided by agents other than the local anesthetics.  Reported result: Extended relief following procedure: 90 % (Long-Term Relief)            Interpretative annotation: Clinically possible results. Good relief. No permanent benefit expected. Inflammation plays a part in the etiology to the pain. Etiology is likely inflammatory and mechanical.  Current benefits: Defined as reported results that persistent at this point in time.   Analgesia: 90-100 % Ms. Rottmann reports improvement of axial and extremity symptoms. Function: Ms. Heaton reports improvement in function ROM: Ms. Pe reports improvement in ROM Interpretative annotation: Ongoing benefit. Therapeutic success. Effective palliative intervention.          Interpretation: Results would suggest a successful diagnostic intervention.                  Plan:  Set up procedure as a PRN palliative treatment option for this patient.                Laboratory Chemistry  Inflammation Markers (CRP: Acute Phase) (ESR: Chronic Phase) Lab Results  Component Value Date  CRP 5.3 (H) 11/02/2017   ESRSEDRATE 12  11/02/2017                         Renal Markers Lab Results  Component Value Date   BUN 19 10/03/2017   CREATININE 0.63 10/03/2017   GFRAA >60 10/03/2017   GFRNONAA >60 10/03/2017                             Hepatic Markers Lab Results  Component Value Date   AST 40 04/21/2017   ALT 53 04/21/2017   ALBUMIN 3.8 04/21/2017                        Note: Lab results reviewed.  Recent Imaging Results   Results for orders placed in visit on 07/04/18  DG C-Arm 1-60 Min-No Report   Narrative Fluoroscopy was utilized by the requesting physician.  No radiographic  interpretation.    Interpretation Report: Fluoroscopy was used during the procedure to assist with needle guidance. The images were interpreted intraoperatively by the requesting physician.  Meds   Current Outpatient Medications:  .  aspirin EC 81 MG tablet, Take 81 mg by mouth daily., Disp: , Rfl:  .  candesartan (ATACAND) 16 MG tablet, Take 8 mg by mouth daily. , Disp: , Rfl:  .  cyclobenzaprine (FLEXERIL) 10 MG tablet, Take 10 mg by mouth at bedtime. , Disp: , Rfl:  .  gabapentin (NEURONTIN) 100 MG capsule, Take 200 mg by mouth 3 (three) times daily. , Disp: , Rfl:  .  glipiZIDE (GLUCOTROL XL) 5 MG 24 hr tablet, Take 10 mg by mouth daily with breakfast. , Disp: , Rfl:  .  meclizine (ANTIVERT) 25 MG tablet, , Disp: , Rfl:  .  Multiple Vitamin (MULTI-VITAMINS) TABS, Take 1 tablet by mouth daily., Disp: , Rfl:  .  rOPINIRole (REQUIP) 0.5 MG tablet, Take 0.5 mg by mouth at bedtime., Disp: , Rfl:  .  warfarin (COUMADIN) 6 MG tablet, Take 6 mg by mouth daily. During the week and 7 mg on weekends, Disp: , Rfl:  .  HYDROcodone-acetaminophen (NORCO/VICODIN) 5-325 MG tablet, Take 1 tablet by mouth 2 (two) times daily as needed for up to 30 days for severe pain. Must last 30 days. (Patient not taking: Reported on 07/24/2018), Disp: 60 tablet, Rfl: 0  ROS  Constitutional: Denies any fever or chills Gastrointestinal: No reported  hemesis, hematochezia, vomiting, or acute GI distress Musculoskeletal: Denies any acute onset joint swelling, redness, loss of ROM, or weakness Neurological: No reported episodes of acute onset apraxia, aphasia, dysarthria, agnosia, amnesia, paralysis, loss of coordination, or loss of consciousness  Allergies  Ms. Mcreynolds is allergic to adhesive [tape]; other; latex; and morphine and related.  PFSH  Drug: Ms. Warmuth  reports no history of drug use. Alcohol:  reports no history of alcohol use. Tobacco:  reports that she has never smoked. She has never used smokeless tobacco. 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 Surgical: Ms. Smiddy  has a past surgical history that includes Knee surgery; Carpal tunnel release (Bilateral); Colon resection sigmoid (N/A, 11/02/2016); Colostomy (N/A, 11/02/2016); Sigmoidoscopy (N/A, 11/13/2016); PULMONARY VENOGRAPHY (N/A, 11/19/2016); Colonoscopy with propofol (N/A, 02/03/2017); Colon surgery (11/02/2016); IVC FILTER INSERTION (Right, 10/2016); Colostomy reversal (  N/A, 02/24/2017); Appendectomy (N/A, 02/24/2017); Lysis of adhesion (N/A, 02/24/2017); laparoscopy (N/A, 02/24/2017); Ileo loop diversion (N/A, 02/24/2017); Sigmoidoscopy (N/A, 05/19/2017); Ileostomy closure (N/A, 06/01/2017); IVC FILTER REMOVAL (N/A, 08/09/2017); and Sacroplasty (N/A, 11/08/2017). Family: family history includes Breast cancer in her cousin and mother; Cancer in her mother; Diabetes in her mother and sister; Heart disease in her father, mother, and sister.  Constitutional Exam  General appearance: Well nourished, well developed, and well hydrated. In no apparent acute distress Vitals:   07/24/18 0816  BP: (!) 142/73  Pulse: 83  Resp: 16  Temp: 97.8 F (36.6 C)  SpO2: 98%   Weight: 227 lb (103 kg)  Height: _0  (1.6 m)   BMI Assessment: Estimated body mass index is 40.21 kg/m as calculated from the following:   Height as of this encounter: _1  (1.6 m).   Weight as of this encounter: 227 lb (103 kg).  BMI interpretation table: BMI level Category Range association with higher incidence of chronic pain  <18 kg/m2 Underweight   18.5-24.9 kg/m2 Ideal body weight   25-29.9 kg/m2 Overweight Increased incidence by 20%  30-34.9 kg/m2 Obese (Class I) Increased incidence by 68%  35-39.9 kg/m2 Severe obesity (Class II) Increased incidence by 136%  >40 kg/m2 Extreme obesity (Class III) Increased incidence by 254%   Patient's current BMI Ideal Body weight  Body mass index is 40.21 kg/m. Ideal body weight: 52.4 kg (115 lb 8.3 oz) Adjusted ideal body weight: 72.6 kg (160 lb 1.8 oz)   BMI Readings from Last 4 Encounters:  07/24/18 40.21 kg/m  07/20/18 40.21 kg/m  07/04/18 37.73 kg/m  06/28/18 37.73 kg/m   Wt Readings from Last 4 Encounters:  07/24/18 227 lb (103 kg)  07/20/18 227 lb (103 kg)  07/04/18 213 lb (96.6 kg)  06/28/18 213 lb (96.6 kg)  Psych/Mental status: Alert, oriented x 3 (person, place, & time)       Eyes: PERLA Respiratory: No evidence of acute respiratory distress  Cervical Spine Area Exam  Skin & Axial Inspection: No masses, redness, edema, swelling, or associated skin lesions Alignment: Symmetrical Functional ROM: Unrestricted ROM      Stability: No instability detected Muscle Tone/Strength: Functionally intact. No obvious neuro-muscular anomalies detected. Sensory (Neurological): Unimpaired Palpation: No palpable anomalies              Upper Extremity (UE) Exam    Side: Right upper extremity  Side: Left upper extremity  Skin & Extremity Inspection: Skin color, temperature, and hair growth are WNL. No peripheral edema or cyanosis. No masses, redness, swelling, asymmetry, or associated skin lesions. No contractures.  Skin &  Extremity Inspection: Skin color, temperature, and hair growth are WNL. No peripheral edema or cyanosis. No masses, redness, swelling, asymmetry, or associated skin lesions. No contractures.  Functional ROM: Unrestricted ROM          Functional ROM: Unrestricted ROM          Muscle Tone/Strength: Functionally intact. No obvious neuro-muscular anomalies detected.  Muscle Tone/Strength: Functionally intact. No obvious neuro-muscular anomalies detected.  Sensory (Neurological): Unimpaired          Sensory (Neurological): Unimpaired          Palpation: No palpable anomalies              Palpation: No palpable anomalies              Provocative Test(s):  Phalen's test: deferred Tinel's test: deferred Apley's scratch test (touch opposite shoulder):  Action 1 (  Across chest): deferred Action 2 (Overhead): deferred Action 3 (LB reach): deferred   Provocative Test(s):  Phalen's test: deferred Tinel's test: deferred Apley's scratch test (touch opposite shoulder):  Action 1 (Across chest): deferred Action 2 (Overhead): deferred Action 3 (LB reach): deferred    Thoracic Spine Area Exam  Skin & Axial Inspection: No masses, redness, or swelling Alignment: Symmetrical Functional ROM: Unrestricted ROM Stability: No instability detected Muscle Tone/Strength: Functionally intact. No obvious neuro-muscular anomalies detected. Sensory (Neurological): Unimpaired Muscle strength & Tone: No palpable anomalies  Lumbar Spine Area Exam  Skin & Axial Inspection: No masses, redness, or swelling Alignment: Symmetrical Functional ROM: Unrestricted ROM       Stability: No instability detected Muscle Tone/Strength: Functionally intact. No obvious neuro-muscular anomalies detected. Sensory (Neurological): Unimpaired Palpation: No palpable anomalies       Provocative Tests: Hyperextension/rotation test: deferred today       Lumbar quadrant test (Kemp's test): deferred today       Lateral bending test: deferred  today       Patrick's Maneuver: deferred today                   FABER* test: deferred today                   S-I anterior distraction/compression test: deferred today         S-I lateral compression test: deferred today         S-I Thigh-thrust test: deferred today         S-I Gaenslen's test: deferred today         *(Flexion, ABduction and External Rotation)  Gait & Posture Assessment  Ambulation: Unassisted Gait: Relatively normal for age and body habitus Posture: WNL   Lower Extremity Exam    Side: Right lower extremity  Side: Left lower extremity  Stability: No instability observed          Stability: No instability observed          Skin & Extremity Inspection: Skin color, temperature, and hair growth are WNL. No peripheral edema or cyanosis. No masses, redness, swelling, asymmetry, or associated skin lesions. No contractures.  Skin & Extremity Inspection: Skin color, temperature, and hair growth are WNL. No peripheral edema or cyanosis. No masses, redness, swelling, asymmetry, or associated skin lesions. No contractures.  Functional ROM: Unrestricted ROM                  Functional ROM: Unrestricted ROM                  Muscle Tone/Strength: Functionally intact. No obvious neuro-muscular anomalies detected.  Muscle Tone/Strength: Functionally intact. No obvious neuro-muscular anomalies detected.  Sensory (Neurological): Unimpaired        Sensory (Neurological): Unimpaired        DTR: Patellar: deferred today Achilles: deferred today Plantar: deferred today  DTR: Patellar: deferred today Achilles: deferred today Plantar: deferred today  Palpation: No palpable anomalies  Palpation: No palpable anomalies   Assessment   Status Diagnosis  Controlled Controlled Controlled 1. Lumbar facet syndrome (Bilateral)   2. Chronic sacroiliac joint pain (Right)   3. Chronic low back pain (Primary Area of Pain) (Bilateral)   4. Chronic lower extremity pain (Secondary Area of Pain)  (Bilateral)   5. Chronic pain syndrome      Updated Problems: No problems updated.  Plan of Care  Pharmacotherapy (Medications Ordered): No orders of the defined types were placed in this encounter.  Medications administered today: [name] had no medications administered during this visit.  Orders:  Orders Placed This Encounter  Procedures  . LUMBAR FACET(MEDIAL BRANCH NERVE BLOCK) MBNB    Standing Status:   Standing    Number of Occurrences:   5    Standing Expiration Date:   07/24/2019    Scheduling Instructions:     Purpose: Palliative     Indication: Axial low back pain. Lumbosacral Spondylosis (M47.897).      Side: Right-sided     Level: L3-4, L4-5, & L5-S1 Facets (L2, L3, L4, L5, & S1 Medial Branch Nerves)     Sedation: With Sedation.     TIMEFRAME: PRN procedure. (Ms. Marcelli will call when needed.)    Order Specific Question:   Where will this procedure be performed?    Answer:   [name]  . SACROILIAC JOINT INJECTION    For low back pain and groin pain.    Standing Status:   Standing    Number of Occurrences:   1    Standing Expiration Date:   07/24/2019    Scheduling Instructions:     Side: Right-sided     Sedation: With Sedation.     TIMEFRAME: PRN procedure. (Ms. Dutkiewicz will call when needed.)    Order Specific Question:   Where will this procedure be performed?    Answer:   [name]   Lab Orders  No laboratory test(s) ordered today   Imaging Orders  No imaging studies ordered today   Referral Orders  No referral(s) requested today   Interventional management options: Planned follow-up:   NOTE:NO RFAuntil BMI is <35. COUMADIN ANTICOAGULATION (Stop: 5 days before proc.; Re-start: 2hrs after proc.) None at this time. Doing well. Plan: Return for PRN Procedure: (R) L-FCT #5 + (R) SI BLK #2.   Considering:   Diagnostic bilateral lumbar facet block#4 Possible bilateral lumbar facet  RFA Therapeutic left-sided L4-5 interlaminar LESI #2 Diagnostic L5-S1 LESI Diagnostic bilateral L3transforaminal ESI Diagnostic bilateral L4transforaminal ESI Diagnostic bilateral L5 transforaminal ESI Diagnostic right-sided sacroiliac joint block Possible right-sided sacroiliac joint RFA Diagnostic bilateral lumbar facet block Possible bilateral lumbar facet RFA Diagnostic right-sided intra-articular hip joint injection Diagnostic right-sided femoral nerve +Obturatornerve block Possible right-sided femoral nerve + obturator RFA Diagnostic (Midline) caudal ESI #2   Palliative PRN treatment(s):   Palliativebilateral lumbar facet block(s) Palliative/Therapeutic (Midline) caudal ESI #4   No future appointments. Primary Care Physician: Tracie Harrier, MD Location: [facility] Note by: Gaspar Cola, MD Date: 07/24/2018; Time: 9:31 AM

## 2018-07-24 ENCOUNTER — Other Ambulatory Visit: Payer: Self-pay

## 2018-07-24 ENCOUNTER — Ambulatory Visit: Payer: PPO | Attending: Pain Medicine | Admitting: Pain Medicine

## 2018-07-24 ENCOUNTER — Encounter: Payer: Self-pay | Admitting: Pain Medicine

## 2018-07-24 VITALS — BP 142/73 | HR 83 | Temp 97.8°F | Resp 16 | Ht 63.0 in | Wt 227.0 lb

## 2018-07-24 DIAGNOSIS — M47816 Spondylosis without myelopathy or radiculopathy, lumbar region: Secondary | ICD-10-CM | POA: Diagnosis not present

## 2018-07-24 DIAGNOSIS — M79605 Pain in left leg: Secondary | ICD-10-CM | POA: Insufficient documentation

## 2018-07-24 DIAGNOSIS — R791 Abnormal coagulation profile: Secondary | ICD-10-CM | POA: Diagnosis not present

## 2018-07-24 DIAGNOSIS — M79604 Pain in right leg: Secondary | ICD-10-CM | POA: Diagnosis not present

## 2018-07-24 DIAGNOSIS — M533 Sacrococcygeal disorders, not elsewhere classified: Secondary | ICD-10-CM | POA: Diagnosis not present

## 2018-07-24 DIAGNOSIS — G8929 Other chronic pain: Secondary | ICD-10-CM | POA: Diagnosis not present

## 2018-07-24 DIAGNOSIS — G894 Chronic pain syndrome: Secondary | ICD-10-CM | POA: Insufficient documentation

## 2018-07-24 DIAGNOSIS — M545 Low back pain: Secondary | ICD-10-CM

## 2018-07-24 NOTE — Patient Instructions (Addendum)
______________________________________________________________________________________________  Specialty Pain Scale  Introduction:  There are significant differences in how pain is reported. The word pain usually refers to physical pain, but it is also a common synonym of suffering. The medical community uses a scale from 0 (zero) to 10 (ten) to report pain level. Zero (0) is described as "no pain", while ten (10) is described as "the worse pain you can imagine". The problem with this scale is that physical pain is reported along with suffering. Suffering refers to mental pain, or more often yet it refers to any unpleasant feeling, emotion or aversion associated with the perception of harm or threat of harm. It is the psychological component of pain.  Pain Specialists prefer to separate the two components. The pain scale used by this practice is the Verbal Numerical Rating Scale (VNRS-11). This scale is for the physical pain only. DO NOT INCLUDE how your pain psychologically affects you. This scale is for adults 21 years of age and older. It has 11 (eleven) levels. The 1st level is 0/10. This means: "right now, I have no pain". In the context of pain management, it also means: "right now, my physical pain is under control with the current therapy".  General Information:  The scale should reflect your current level of pain. Unless you are specifically asked for the level of your worst pain, or your average pain. If you are asked for one of these two, then it should be understood that it is over the past 24 hours.  Levels 1 (one) through 5 (five) are described below, and can be treated as an outpatient. Ambulatory pain management facilities such as ours are more than adequate to treat these levels. Levels 6 (six) through 10 (ten) are also described below, however, these must be treated as a hospitalized patient. While levels 6 (six) and 7 (seven) may be evaluated at an urgent care facility, levels 8  (eight) through 10 (ten) constitute medical emergencies and as such, they belong in a hospital's emergency department. When having these levels (as described below), do not come to our office. Our facility is not equipped to manage these levels. Go directly to an urgent care facility or an emergency department to be evaluated.  Definitions:  Activities of Daily Living (ADL): Activities of daily living (ADL or ADLs) is a term used in healthcare to refer to people's daily self-care activities. Health professionals often use a person's ability or inability to perform ADLs as a measurement of their functional status, particularly in regard to people post injury, with disabilities and the elderly. There are two ADL levels: Basic and Instrumental. Basic Activities of Daily Living (BADL  or BADLs) consist of self-care tasks that include: Bathing and showering; personal hygiene and grooming (including brushing/combing/styling hair); dressing; Toilet hygiene (getting to the toilet, cleaning oneself, and getting back up); eating and self-feeding (not including cooking or chewing and swallowing); functional mobility, often referred to as "transferring", as measured by the ability to walk, get in and out of bed, and get into and out of a chair; the broader definition (moving from one place to another while performing activities) is useful for people with different physical abilities who are still able to get around independently. Basic ADLs include the things many people do when they get up in the morning and get ready to go out of the house: get out of bed, go to the toilet, bathe, dress, groom, and eat. On the average, loss of function typically follows a particular order.   Hygiene is the first to go, followed by loss of toilet use and locomotion. The last to go is the ability to eat. When there is only one remaining area in which the person is independent, there is a 62.9% chance that it is eating and only a 3.5% chance  that it is hygiene. Instrumental Activities of Daily Living (IADL or IADLs) are not necessary for fundamental functioning, but they let an individual live independently in a community. IADL consist of tasks that include: cleaning and maintaining the house; home establishment and maintenance; care of others (including selecting and supervising caregivers); care of pets; child rearing; managing money; managing financials (investments, etc.); meal preparation and cleanup; shopping for groceries and necessities; moving within the community; safety procedures and emergency responses; health management and maintenance (taking prescribed medications); and using the telephone or other form of communication.  Instructions:  Most patients tend to report their pain as a combination of two factors, their physical pain and their psychosocial pain. This last one is also known as "suffering" and it is reflection of how physical pain affects you socially and psychologically. From now on, report them separately.  From this point on, when asked to report your pain level, report only your physical pain. Use the following table for reference.  Pain Clinic Pain Levels (0-5/10)  Pain Level Score  Description  No Pain 0   Mild pain 1 Nagging, annoying, but does not interfere with basic activities of daily living (ADL). Patients are able to eat, bathe, get dressed, toileting (being able to get on and off the toilet and perform personal hygiene functions), transfer (move in and out of bed or a chair without assistance), and maintain continence (able to control bladder and bowel functions). Blood pressure and heart rate are unaffected. A normal heart rate for a healthy adult ranges from 60 to 100 bpm (beats per minute).   Mild to moderate pain 2 Noticeable and distracting. Impossible to hide from other people. More frequent flare-ups. Still possible to adapt and function close to normal. It can be very annoying and may have  occasional stronger flare-ups. With discipline, patients may get used to it and adapt.   Moderate pain 3 Interferes significantly with activities of daily living (ADL). It becomes difficult to feed, bathe, get dressed, get on and off the toilet or to perform personal hygiene functions. Difficult to get in and out of bed or a chair without assistance. Very distracting. With effort, it can be ignored when deeply involved in activities.   Moderately severe pain 4 Impossible to ignore for more than a few minutes. With effort, patients may still be able to manage work or participate in some social activities. Very difficult to concentrate. Signs of autonomic nervous system discharge are evident: dilated pupils (mydriasis); mild sweating (diaphoresis); sleep interference. Heart rate becomes elevated (>115 bpm). Diastolic blood pressure (lower number) rises above 100 mmHg. Patients find relief in laying down and not moving.   Severe pain 5 Intense and extremely unpleasant. Associated with frowning face and frequent crying. Pain overwhelms the senses.  Ability to do any activity or maintain social relationships becomes significantly limited. Conversation becomes difficult. Pacing back and forth is common, as getting into a comfortable position is nearly impossible. Pain wakes you up from deep sleep. Physical signs will be obvious: pupillary dilation; increased sweating; goosebumps; brisk reflexes; cold, clammy hands and feet; nausea, vomiting or dry heaves; loss of appetite; significant sleep disturbance with inability to fall asleep or to   remain asleep. When persistent, significant weight loss is observed due to the complete loss of appetite and sleep deprivation.  Blood pressure and heart rate becomes significantly elevated. Caution: If elevated blood pressure triggers a pounding headache associated with blurred vision, then the patient should immediately seek attention at an urgent or emergency care unit, as  these may be signs of an impending stroke.    Emergency Department Pain Levels (6-10/10)  Emergency Room Pain 6 Severely limiting. Requires emergency care and should not be seen or managed at an outpatient pain management facility. Communication becomes difficult and requires great effort. Assistance to reach the emergency department may be required. Facial flushing and profuse sweating along with potentially dangerous increases in heart rate and blood pressure will be evident.   Distressing pain 7 Self-care is very difficult. Assistance is required to transport, or use restroom. Assistance to reach the emergency department will be required. Tasks requiring coordination, such as bathing and getting dressed become very difficult.   Disabling pain 8 Self-care is no longer possible. At this level, pain is disabling. The individual is unable to do even the most "basic" activities such as walking, eating, bathing, dressing, transferring to a bed, or toileting. Fine motor skills are lost. It is difficult to think clearly.   Incapacitating pain 9 Pain becomes incapacitating. Thought processing is no longer possible. Difficult to remember your own name. Control of movement and coordination are lost.   The worst pain imaginable 10 At this level, most patients pass out from pain. When this level is reached, collapse of the autonomic nervous system occurs, leading to a sudden drop in blood pressure and heart rate. This in turn results in a temporary and dramatic drop in blood flow to the brain, leading to a loss of consciousness. Fainting is one of the body's self defense mechanisms. Passing out puts the brain in a calmed state and causes it to shut down for a while, in order to begin the healing process.    Summary: 1. Refer to this scale when providing [place] with your pain level. 2. Be accurate and careful when reporting your pain level. This will help with your care. 3. Over-reporting your pain level will  lead to loss of credibility. 4. Even a level of 1/10 means that there is pain and will be treated at our facility. 5. High, inaccurate reporting will be documented as "Symptom Exaggeration", leading to loss of credibility and suspicions of possible secondary gains such as obtaining more narcotics, or wanting to appear disabled, for fraudulent reasons. 6. Only pain levels of 5 or below will be seen at our facility. 7. Pain levels of 6 and above will be sent to the Emergency Department and the appointment cancelled. ______________________________________________________________________________________________   ____________________________________________________________________________________________  Blood Thinners  Recommended Time Interval Before and After Neuraxial Block or Catheter Removal  Drug (Generic) Brand Name Time Before Time After Comments  Abciximab Reopro 15 days 2 hours   Alteplase Activase 10 days 10 days   Apixaban Eliquis 3 days 6 hours   Aspirin > 325 mg Goody Powders/Excedrin 11 days  (Usually not stopped)  Aspirin ? 81 mg  7 days  (Usually not stopped)  Cholesterol Medication Lipitor 4 days    Cilostazol Pletal 3 days 5 hours   Clopidogrel Plavix 7-10 days 2 hours   Dabigatran Pradaxa 5 days 6 hours   Delteparin Fragmin 24 hours 4 hours   Dipyridamole + ASA Aggrenox 11days 2 hours   Enoxaparin  Lovenox  24 hours 4 hours   Eptifibatide Integrillin 8 hours 2 hours   Fish oil  4 days    Fondaparinux  Arixtra 72 hours 12 hours   Garlic supplements  7 days    Ginkgo biloba  36 hours    Ginseng  24 hours    Heparin (IV)  4 hours 2 hours   Heparin ([place])  12 hours 2 hours   Hydroxychloroquine Plaquenil 11 days    LMW Heparin  24 hours    LMWH  24 hours    NSAIDs  3 days  (Usually not stopped)  Prasugrel Effient 7-10 days 6 hours   Reteplase Retavase 10 days 10 days   Rivaroxaban Xarelto 3 days 6 hours   Streptokinase Streptase 10 days 10 days   Tenecteplase  TNKase 10 days 10 days   Thrombolytics  10 days  10 days Avoid x 10 days after inj.  Ticagrelor Brilinta 5-7 days 6 hours   Ticlodipine Ticlid 10-14 days 2 hours   Tinzaparin Innohep 24 hours 4 hours   Tirofiban Aggrastat 8 hours 2 hours   Vitamin E  4 days    Warfarin Coumadin 5 days 2 hours   ____________________________________________________________________________________________   ____________________________________________________________________________________________  Preparing for Procedure with Sedation  Instructions: . Oral Intake: Do not eat or drink anything for at least 8 hours prior to your procedure. . Transportation: Public transportation is not allowed. Bring an adult driver. The driver must be physically present in our waiting room before any procedure can be started. Marland Kitchen Physical Assistance: Bring an adult physically capable of assisting you, in the event you need help. This adult should keep you company at home for at least 6 hours after the procedure. . Blood Pressure Medicine: Take your blood pressure medicine with a sip of water the morning of the procedure. . Blood thinners: Notify our staff if you are taking any blood thinners. Depending on which one you take, there will be specific instructions on how and when to stop it. . Diabetics on insulin: Notify the staff so that you can be scheduled 1st case in the morning. If your diabetes requires high dose insulin, take only  of your normal insulin dose the morning of the procedure and notify the staff that you have done so. . Preventing infections: Shower with an antibacterial soap the morning of your procedure. . Build-up your immune system: Take 1000 mg of Vitamin C with every meal (3 times a day) the day prior to your procedure. Marland Kitchen Antibiotics: Inform the staff if you have a condition or reason that requires you to take antibiotics before dental procedures. . Pregnancy: If you are pregnant, call and cancel the  procedure. . Sickness: If you have a cold, fever, or any active infections, call and cancel the procedure. . Arrival: You must be in the facility at least 30 minutes prior to your scheduled procedure. . Children: Do not bring children with you. . Dress appropriately: Bring dark clothing that you would not mind if they get stained. . Valuables: Do not bring any jewelry or valuables.  Procedure appointments are reserved for interventional treatments only. Marland Kitchen No Prescription Refills. . No medication changes will be discussed during procedure appointments. . No disability issues will be discussed.  Reasons to call and reschedule or cancel your procedure: (Following these recommendations will minimize the risk of a serious complication.) . Surgeries: Avoid having procedures within 2 weeks of any surgery. (Avoid for 2 weeks before or after any surgery). Marland Kitchen  Flu Shots: Avoid having procedures within 2 weeks of a flu shots or . (Avoid for 2 weeks before or after immunizations). . Barium: Avoid having a procedure within 7-10 days after having had a radiological study involving the use of radiological contrast. (Myelograms, Barium swallow or enema study). . Heart attacks: Avoid any elective procedures or surgeries for the initial 6 months after a "Myocardial Infarction" (Heart Attack). . Blood thinners: It is imperative that you stop these medications before procedures. Let us know if you if you take any blood thinner.  . Infection: Avoid procedures during or within two weeks of an infection (including chest colds or gastrointestinal problems). Symptoms associated with infections include: Localized redness, fever, chills, night sweats or profuse sweating, burning sensation when voiding, cough, congestion, stuffiness, runny nose, sore throat, diarrhea, nausea, vomiting, cold or Flu symptoms, recent or current infections. It is specially important if the infection is over the area that we intend to  treat. Marland Kitchen Heart and lung problems: Symptoms that may suggest an active cardiopulmonary problem include: cough, chest pain, breathing difficulties or shortness of breath, dizziness, ankle swelling, uncontrolled high or unusually low blood pressure, and/or palpitations. If you are experiencing any of these symptoms, cancel your procedure and contact your primary care physician for an evaluation.  Remember:  Regular Business hours are:  Monday to Thursday 8:00 AM to 4:00 PM  Provider's Schedule: Milinda Pointer, MD:  Procedure days: Tuesday and Thursday 7:30 AM to 4:00 PM  Gillis Santa, MD:  Procedure days: Monday and Wednesday 7:30 AM to 4:00 PM ____________________________________________________________________________________________   ____________________________________________________________________________________________  Weight Management Required  URGENT: Your weight has been found to be adversely affecting your health.  Dear [name]:  Your current Body mass index is 40.21 kg/m.Marland Kitchen Estimated body mass index is 40.21 kg/m as calculated from the following:   Height as of this encounter: 5\' 3"  (1.6 m).   Weight as of this encounter: 227 lb (103 kg).  Your last four (4) weight and BMI calculations are as follows: Wt Readings from Last 4 Encounters:  07/24/18 227 lb (103 kg)  07/20/18 227 lb (103 kg)  07/04/18 213 lb (96.6 kg)  06/28/18 213 lb (96.6 kg)   BMI Readings from Last 4 Encounters:  07/24/18 40.21 kg/m  07/20/18 40.21 kg/m  07/04/18 37.73 kg/m  06/28/18 37.73 kg/m    Calculations estimate your ideal body weight to be: Ideal body weight: 52.4 kg (115 lb 8.3 oz) Adjusted ideal body weight: 72.6 kg (160 lb 1.8 oz)  Please use the table below to identify your weight category and associated incidence of chronic pain, secondary to your weight.  BMI interpretation table: BMI level Category Associated incidence of chronic pain  <18 kg/m2  Underweight   18.5-24.9 kg/m2 Ideal body weight   25-29.9 kg/m2 Overweight  20%  30-34.9 kg/m2 Obese (Class I)  68%  35-39.9 kg/m2 Severe obesity (Class II)  136%  >40 kg/m2 Extreme obesity (Class III)  254%   In addition: You will be considered "Morbidly Obese", if your BMI is above 30 and you have one or more of the following conditions which are known to be directly associated with obesity: 8. Type 2 Diabetes (Which in turn can lead to cardiovascular diseases (CVD), stroke, peripheral vascular diseases (PVD), retinopathy, nephropathy, and neuropathy) 9. Cardiovascular Disease (High Blood Pressure; Congestive Heart Failure; High Cholesterol; Coronary Artery Disease; Angina; or History of Heart Attacks) 10. Breathing problems (Asthma; obesity-hypoventilation syndrome; obstructive sleep apnea; chronic inflammatory airway disease;  reactive airway disease; or shortness of breath) 11. Chronic kidney disease 12. Liver disease (nonalcoholic fatty liver disease) 13. High blood pressure 14. Acid reflux (gastroesophageal reflux disease; heartburn) 15. Osteoarthritis (OA) (with any of the following: hip pain; knee pain; and/or low back pain) 16. Low back pain (Lumbar Facet Syndrome; and/or Degenerative Disc Disease) 17. Hip pain (Osteoarthritis of hip) (For every 1 lbs of added body weight, there is a 2 lbs increase in pressure inside of each hip articulation. 1:2 mechanical relationship) 18. Knee pain (Osteoarthritis of knee) (For every 1 lbs of added body weight, there is a 4 lbs increase in pressure inside of each knee articulation. 1:4 mechanical relationship) (patients with a BMI>30 kg/m2 were 6.8 times more likely to develop knee OA than normal-weight individuals) 19. Certain types of cancer. (Epidemiological studies have shown that obesity is a risk factor for: post-menopausal breast cancer; cancers of the endometrium, colon and kidney cancer; malignant adenomas of the oesophagus. Obese subjects  have an approximately 1.5-3.5-fold increased risk of developing these cancers compared with normal-weight subjects, and it has been estimated that between 15 and 45% of these cancers can be attributed to overweight. More recent studies suggest that obesity may also increase the risk of other types of cancer, including pancreatic, hepatic and gallbladder cancer. Ref: Obesity and cancer. Pischon T, Nthlings U, [employer] H. Proc Nutr Soc. 2008 May;67(2):128-45. doi: 27.2536/U4403474259563875.)  Recommendation: At this point it is urgent that you take a step back and concentrate in loosing weight. Dedicate 100% of your efforts on this task. Nothing else will improve your health more than bringing down your BMI to less than 30. Because most chronic pain patients do have difficulty exercising secondary to their pain, you must rely on proper nutrition and dieting in order to lose the weight. If your BMI is above 40, you should seriously consider bariatric surgery. A realistic goal is to lose 10% of your body weight over a period of 12 months.  If over time you have unsuccessfully try to lose weight, then it is time for you to seek professional help and to enter a medically supervised weight management program.  Pain management considerations:  1. Pharmacological Problems: Be advised that the use of opioid analgesics (oxycodone; hydrocodone; morphine; methadone; codeine; and all of their derivatives) have been associated with decreased metabolism and weight gain.  For this reason, should we see that you are unable to lose weight while taking these medications, it may become necessary for [place] to taper down and indefinitely discontinue them.  2. Technical Problems: The incidence of successful interventional therapies decreases as the patient's BMI increases. It is much more difficult to accomplish a safe and effective interventional therapy on a patient with a BMI above 35. Yours is Body mass index is 40.21 kg/m.Marland Kitchen   3. Radiation Exposure Problems: The x-rays machine, used to accomplish injection therapies, will automatically increase their x-ray output in order to capture an appropriate bone image. This means that radiation exposure increases exponentially with the patient's BMI. (The higher the BMI, the higher the radiation exposure.) Although the level of radiation used at a given time is still safe to the patient, it is not for the physician and/or assisting staff. Unfortunately, radiation exposure is accumulative. Because physicians and the staff have to do procedures and be exposed on a daily basis, this can result in health problems such as cancer and radiation burns. Radiation exposure to the staff is monitored by the radiation batches that they wear. The  exposure levels are reported back to the staff on a quarterly basis. Depending on levels of exposure, physicians and staff may be obligated by law to decrease this exposure. This means that they have the right and obligation to refuse providing therapies where they may be overexposed to radiation. For this reason, physicians may decline to offer therapies such as radiofrequency ablation or implants to patients with a BMI above 40. 4. Current Trends: Be advised that the current trend is to no longer offer certain therapies to patients with a BMI equal to, or above 35, due to increase perioperative risks, increased technical procedural difficulties, and excessive radiation exposure to healthcare personnel. ____________________________________________________________________________________________  Preparing for Procedure with Sedation Instructions: . Oral Intake: Do not eat or drink anything for at least 8 hours prior to your procedure. . Transportation: Public transportation is not allowed. Bring an adult driver. The driver must be physically present in our waiting room before any procedure can be started. Marland Kitchen Physical Assistance: Bring an adult capable of  physically assisting you, in the event you need help. . Blood Pressure Medicine: Take your blood pressure medicine with a sip of water the morning of the procedure. . Insulin: Take only  of your normal insulin dose. . Preventing infections: Shower with an antibacterial soap the morning of your procedure. . Build-up your immune system: Take 1000 mg of Vitamin C with every meal (3 times a day) the day prior to your procedure. . Pregnancy: If you are pregnant, call and cancel the procedure. . Sickness: If you have a cold, fever, or any active infections, call and cancel the procedure. . Arrival: You must be in the facility at least 30 minutes prior to your scheduled procedure. . Children: Do not bring children with you. . Dress appropriately: Bring dark clothing that you would not mind if they get stained. . Valuables: Do not bring any jewelry or valuables. Procedure appointments are reserved for interventional treatments only. Marland Kitchen No Prescription Refills. . No medication changes will be discussed during procedure appointments. . No disability issues will be discussed.

## 2018-07-24 NOTE — Progress Notes (Signed)
Nursing Pain Medication Assessment:  Safety precautions to be maintained throughout the outpatient stay will include: orient to surroundings, keep bed in low position, maintain call bell within reach at all times, provide assistance with transfer out of bed and ambulation.  Medication Inspection Compliance: Ms. Bier did not comply with our request to bring her pills to be counted. She was reminded that bringing the medication bottles, even when empty, is a requirement.  Medication: None brought in. Pill/Patch Count: None available to be counted. Bottle Appearance: No container available. Did not bring bottle(s) to appointment. Filled Date: N/A Last Medication intake:  has not taken in over 2 weeks

## 2018-08-10 ENCOUNTER — Telehealth: Payer: Self-pay | Admitting: Pain Medicine

## 2018-08-10 NOTE — Telephone Encounter (Signed)
Patient is having facet blocks on 08-22-18 and needs to know when to stop Warfarin. Please call and give instructions. Thank you

## 2018-08-10 NOTE — Telephone Encounter (Signed)
Patient instructed per voicemail to stop Coumadin 5 days before procedure.

## 2018-08-22 ENCOUNTER — Other Ambulatory Visit: Payer: Self-pay

## 2018-08-22 ENCOUNTER — Ambulatory Visit
Admission: RE | Admit: 2018-08-22 | Discharge: 2018-08-22 | Disposition: A | Discharge status: Home / Self Care | Payer: PPO | Source: Ambulatory Visit | Attending: Pain Medicine | Admitting: Pain Medicine

## 2018-08-22 ENCOUNTER — Ambulatory Visit (HOSPITAL_BASED_OUTPATIENT_CLINIC_OR_DEPARTMENT_OTHER): Payer: PPO | Admitting: Pain Medicine

## 2018-08-22 VITALS — BP 119/69 | HR 93 | Temp 97.9°F | Resp 19 | Ht 63.0 in | Wt 225.0 lb

## 2018-08-22 DIAGNOSIS — I110 Hypertensive heart disease with heart failure: Secondary | ICD-10-CM | POA: Diagnosis present

## 2018-08-22 DIAGNOSIS — R110 Nausea: Secondary | ICD-10-CM | POA: Diagnosis not present

## 2018-08-22 DIAGNOSIS — K5651 Intestinal adhesions [bands], with partial obstruction: Secondary | ICD-10-CM | POA: Diagnosis present

## 2018-08-22 DIAGNOSIS — R0902 Hypoxemia: Secondary | ICD-10-CM | POA: Diagnosis not present

## 2018-08-22 DIAGNOSIS — M5137 Other intervertebral disc degeneration, lumbosacral region: Secondary | ICD-10-CM | POA: Diagnosis present

## 2018-08-22 DIAGNOSIS — Z91048 Other nonmedicinal substance allergy status: Secondary | ICD-10-CM

## 2018-08-22 DIAGNOSIS — Z7901 Long term (current) use of anticoagulants: Secondary | ICD-10-CM | POA: Insufficient documentation

## 2018-08-22 DIAGNOSIS — E1165 Type 2 diabetes mellitus with hyperglycemia: Secondary | ICD-10-CM | POA: Diagnosis present

## 2018-08-22 DIAGNOSIS — G8929 Other chronic pain: Secondary | ICD-10-CM | POA: Insufficient documentation

## 2018-08-22 DIAGNOSIS — M545 Low back pain: Secondary | ICD-10-CM

## 2018-08-22 DIAGNOSIS — G894 Chronic pain syndrome: Secondary | ICD-10-CM | POA: Diagnosis present

## 2018-08-22 DIAGNOSIS — R1111 Vomiting without nausea: Secondary | ICD-10-CM | POA: Diagnosis not present

## 2018-08-22 DIAGNOSIS — M47816 Spondylosis without myelopathy or radiculopathy, lumbar region: Secondary | ICD-10-CM | POA: Diagnosis not present

## 2018-08-22 DIAGNOSIS — Z0189 Encounter for other specified special examinations: Secondary | ICD-10-CM

## 2018-08-22 DIAGNOSIS — M4807 Spinal stenosis, lumbosacral region: Secondary | ICD-10-CM | POA: Diagnosis present

## 2018-08-22 DIAGNOSIS — M1611 Unilateral primary osteoarthritis, right hip: Secondary | ICD-10-CM | POA: Diagnosis present

## 2018-08-22 DIAGNOSIS — Z86718 Personal history of other venous thrombosis and embolism: Secondary | ICD-10-CM

## 2018-08-22 DIAGNOSIS — M533 Sacrococcygeal disorders, not elsewhere classified: Secondary | ICD-10-CM

## 2018-08-22 DIAGNOSIS — Z7984 Long term (current) use of oral hypoglycemic drugs: Secondary | ICD-10-CM | POA: Diagnosis not present

## 2018-08-22 DIAGNOSIS — K760 Fatty (change of) liver, not elsewhere classified: Secondary | ICD-10-CM | POA: Diagnosis present

## 2018-08-22 DIAGNOSIS — K565 Intestinal adhesions [bands], unspecified as to partial versus complete obstruction: Secondary | ICD-10-CM | POA: Diagnosis not present

## 2018-08-22 DIAGNOSIS — G4762 Sleep related leg cramps: Secondary | ICD-10-CM | POA: Diagnosis present

## 2018-08-22 DIAGNOSIS — M47817 Spondylosis without myelopathy or radiculopathy, lumbosacral region: Secondary | ICD-10-CM | POA: Diagnosis present

## 2018-08-22 DIAGNOSIS — Z86711 Personal history of pulmonary embolism: Secondary | ICD-10-CM

## 2018-08-22 DIAGNOSIS — Z9049 Acquired absence of other specified parts of digestive tract: Secondary | ICD-10-CM | POA: Diagnosis not present

## 2018-08-22 DIAGNOSIS — M5388 Other specified dorsopathies, sacral and sacrococcygeal region: Secondary | ICD-10-CM | POA: Insufficient documentation

## 2018-08-22 DIAGNOSIS — E1142 Type 2 diabetes mellitus with diabetic polyneuropathy: Secondary | ICD-10-CM | POA: Diagnosis present

## 2018-08-22 DIAGNOSIS — M4316 Spondylolisthesis, lumbar region: Secondary | ICD-10-CM | POA: Diagnosis present

## 2018-08-22 DIAGNOSIS — Z91040 Latex allergy status: Secondary | ICD-10-CM | POA: Insufficient documentation

## 2018-08-22 DIAGNOSIS — Z7982 Long term (current) use of aspirin: Secondary | ICD-10-CM | POA: Diagnosis not present

## 2018-08-22 DIAGNOSIS — K56609 Unspecified intestinal obstruction, unspecified as to partial versus complete obstruction: Secondary | ICD-10-CM | POA: Diagnosis not present

## 2018-08-22 DIAGNOSIS — Z79899 Other long term (current) drug therapy: Secondary | ICD-10-CM

## 2018-08-22 DIAGNOSIS — Z8614 Personal history of Methicillin resistant Staphylococcus aureus infection: Secondary | ICD-10-CM | POA: Diagnosis not present

## 2018-08-22 DIAGNOSIS — R112 Nausea with vomiting, unspecified: Secondary | ICD-10-CM | POA: Diagnosis present

## 2018-08-22 DIAGNOSIS — I5022 Chronic systolic (congestive) heart failure: Secondary | ICD-10-CM | POA: Diagnosis present

## 2018-08-22 DIAGNOSIS — Z885 Allergy status to narcotic agent status: Secondary | ICD-10-CM

## 2018-08-22 DIAGNOSIS — K8020 Calculus of gallbladder without cholecystitis without obstruction: Secondary | ICD-10-CM | POA: Diagnosis present

## 2018-08-22 DIAGNOSIS — K439 Ventral hernia without obstruction or gangrene: Secondary | ICD-10-CM | POA: Diagnosis present

## 2018-08-22 DIAGNOSIS — Z6839 Body mass index (BMI) 39.0-39.9, adult: Secondary | ICD-10-CM

## 2018-08-22 DIAGNOSIS — Z931 Gastrostomy status: Secondary | ICD-10-CM | POA: Diagnosis not present

## 2018-08-22 MED ORDER — ROPIVACAINE HCL 2 MG/ML IJ SOLN
4.0000 mL | Freq: Once | INTRAMUSCULAR | Status: AC
  Administered 2018-08-22: 10 mL via INTRA_ARTICULAR
  Filled 2018-08-22: qty 10

## 2018-08-22 MED ORDER — TRIAMCINOLONE ACETONIDE 40 MG/ML IJ SUSP
40.0000 mg | Freq: Once | INTRAMUSCULAR | Status: AC
  Administered 2018-08-22: 40 mg
  Filled 2018-08-22: qty 1

## 2018-08-22 MED ORDER — METHYLPREDNISOLONE ACETATE 80 MG/ML IJ SUSP
80.0000 mg | Freq: Once | INTRAMUSCULAR | Status: AC
  Administered 2018-08-22: 80 mg via INTRA_ARTICULAR
  Filled 2018-08-22: qty 1

## 2018-08-22 MED ORDER — LIDOCAINE HCL 2 % IJ SOLN
INTRAMUSCULAR | Status: AC
  Filled 2018-08-22: qty 20

## 2018-08-22 MED ORDER — ROPIVACAINE HCL 2 MG/ML IJ SOLN
9.0000 mL | Freq: Once | INTRAMUSCULAR | Status: AC
  Administered 2018-08-22: 10 mL via PERINEURAL
  Filled 2018-08-22: qty 10

## 2018-08-22 NOTE — Patient Instructions (Addendum)
____________________________________________________________________________________________  Post-Procedure Discharge Instructions  Instructions:  Apply ice:   Purpose: This will minimize any swelling and discomfort after procedure.   When: Day of procedure, as soon as you get home.  How: Fill a plastic sandwich bag with crushed ice. Cover it with a small towel and apply to injection site.  How long: (15 min on, 15 min off) Apply for 15 minutes then remove x 15 minutes.  Repeat sequence on day of procedure, until you go to bed.  Apply heat:   Purpose: To treat any soreness and discomfort from the procedure.  When: Starting the next day after the procedure.  How: Apply heat to procedure site starting the day following the procedure.  How long: May continue to repeat daily, until discomfort goes away.  Food intake: Start with clear liquids (like water) and advance to regular food, as tolerated.   Physical activities: Keep activities to a minimum for the first 8 hours after the procedure. After that, then as tolerated.  Driving: If you have received any sedation, be responsible and do not drive. You are not allowed to drive for 24 hours after having sedation.  Blood thinner: (Applies only to those taking blood thinners) You may restart your blood thinner 6 hours after your procedure.  Insulin: (Applies only to Diabetic patients taking insulin) As soon as you can eat, you may resume your normal dosing schedule.  Infection prevention: Keep procedure site clean and dry. Shower daily and clean area with soap and water.  Post-procedure Pain Diary: Extremely important that this be done correctly and accurately. Recorded information will be used to determine the next step in treatment. For the purpose of accuracy, follow these rules:  Evaluate only the area treated. Do not report or include pain from an untreated area. For the purpose of this evaluation, ignore all other areas of pain,  except for the treated area.  After your procedure, avoid taking a long nap and attempting to complete the pain diary after you wake up. Instead, set your alarm clock to go off every hour, on the hour, for the initial 8 hours after the procedure. Document the duration of the numbing medicine, and the relief you are getting from it.  Do not go to sleep and attempt to complete it later. It will not be accurate. If you received sedation, it is likely that you were given a medication that may cause amnesia. Because of this, completing the diary at a later time may cause the information to be inaccurate. This information is needed to plan your care.  Follow-up appointment: Keep your post-procedure follow-up evaluation appointment after the procedure (usually 2 weeks for most procedures, 6 weeks for radiofrequencies). DO NOT FORGET to bring you pain diary with you.   Expect: (What should I expect to see with my procedure?)  From numbing medicine (AKA: Local Anesthetics): Numbness or decrease in pain. You may also experience some weakness, which if present, could last for the duration of the local anesthetic.  Onset: Full effect within 15 minutes of injected.  Duration: It will depend on the type of local anesthetic used. On the average, 1 to 8 hours.   From steroids (Applies only if steroids were used): Decrease in swelling or inflammation. Once inflammation is improved, relief of the pain will follow.  Onset of benefits: Depends on the amount of swelling present. The more swelling, the longer it will take for the benefits to be seen. In some cases, up to 10 days.    Duration: Steroids will stay in the system x 2 weeks. Duration of benefits will depend on multiple posibilities including persistent irritating factors.  Side-effects: If present, they may typically last 2 weeks (the duration of the steroids).  Frequent: Cramps (if they occur, drink Gatorade and take over-the-counter Magnesium 450-500 mg  once to twice a day); water retention with temporary weight gain; increases in blood sugar; decreased immune system response; increased appetite.  Occasional: Facial flushing (red, warm cheeks); mood swings; menstrual changes.  Uncommon: Long-term decrease or suppression of natural hormones; bone thinning. (These are more common with higher doses or more frequent use. This is why we prefer that our patients avoid having any injection therapies in other practices.)   Very Rare: Severe mood changes; psychosis; aseptic necrosis.  From procedure: Some discomfort is to be expected once the numbing medicine wears off. This should be minimal if ice and heat are applied as instructed.  Call if: (When should I call?)  You experience numbness and weakness that gets worse with time, as opposed to wearing off.  New onset bowel or bladder incontinence. (Applies only to procedures done in the spine)  Emergency Numbers:  Durning business hours (Monday - Thursday, 8:00 AM - 4:00 PM) (Friday, 9:00 AM - 12:00 Noon): [phone]  After hours: [phone]  NOTE: If you are having a problem and are unable connect with, or to talk to a provider, then go to your nearest urgent care or emergency department. If the problem is serious and urgent, please call 911. ____________________________________________________________________________________________    ______________________________________________________________________________________________  Specialty Pain Scale  Introduction:  There are significant differences in how pain is reported. The word pain usually refers to physical pain, but it is also a common synonym of suffering. The medical community uses a scale from 0 (zero) to 10 (ten) to report pain level. Zero (0) is described as "no pain", while ten (10) is described as "the worse pain you can imagine". The problem with this scale is that physical pain is reported along with suffering.  Suffering refers to mental pain, or more often yet it refers to any unpleasant feeling, emotion or aversion associated with the perception of harm or threat of harm. It is the psychological component of pain.  Pain Specialists prefer to separate the two components. The pain scale used by this practice is the Verbal Numerical Rating Scale (VNRS-11). This scale is for the physical pain only. DO NOT INCLUDE how your pain psychologically affects you. This scale is for adults 21 years of age and older. It has 11 (eleven) levels. The 1st level is 0/10. This means: "right now, I have no pain". In the context of pain management, it also means: "right now, my physical pain is under control with the current therapy".  General Information:  The scale should reflect your current level of pain. Unless you are specifically asked for the level of your worst pain, or your average pain. If you are asked for one of these two, then it should be understood that it is over the past 24 hours.  Levels 1 (one) through 5 (five) are described below, and can be treated as an outpatient. Ambulatory pain management facilities such as ours are more than adequate to treat these levels. Levels 6 (six) through 10 (ten) are also described below, however, these must be treated as a hospitalized patient. While levels 6 (six) and 7 (seven) may be evaluated at an urgent care facility, levels 8 (eight) through 10 (ten)   constitute medical emergencies and as such, they belong in a hospital's emergency department. When having these levels (as described below), do not come to our office. Our facility is not equipped to manage these levels. Go directly to an urgent care facility or an emergency department to be evaluated.  Definitions:  Activities of Daily Living (ADL): Activities of daily living (ADL or ADLs) is a term used in healthcare to refer to people's daily self-care activities. Health professionals often use a person's ability or inability  to perform ADLs as a measurement of their functional status, particularly in regard to people post injury, with disabilities and the elderly. There are two ADL levels: Basic and Instrumental. Basic Activities of Daily Living (BADL  or BADLs) consist of self-care tasks that include: Bathing and showering; personal hygiene and grooming (including brushing/combing/styling hair); dressing; Toilet hygiene (getting to the toilet, cleaning oneself, and getting back up); eating and self-feeding (not including cooking or chewing and swallowing); functional mobility, often referred to as "transferring", as measured by the ability to walk, get in and out of bed, and get into and out of a chair; the broader definition (moving from one place to another while performing activities) is useful for people with different physical abilities who are still able to get around independently. Basic ADLs include the things many people do when they get up in the morning and get ready to go out of the house: get out of bed, go to the toilet, bathe, dress, groom, and eat. On the average, loss of function typically follows a particular order. Hygiene is the first to go, followed by loss of toilet use and locomotion. The last to go is the ability to eat. When there is only one remaining area in which the person is independent, there is a 62.9% chance that it is eating and only a 3.5% chance that it is hygiene. Instrumental Activities of Daily Living (IADL or IADLs) are not necessary for fundamental functioning, but they let an individual live independently in a community. IADL consist of tasks that include: cleaning and maintaining the house; home establishment and maintenance; care of others (including selecting and supervising caregivers); care of pets; child rearing; managing money; managing financials (investments, etc.); meal preparation and cleanup; shopping for groceries and necessities; moving within the community; safety procedures  and emergency responses; health management and maintenance (taking prescribed medications); and using the telephone or other form of communication.  Instructions:  Most patients tend to report their pain as a combination of two factors, their physical pain and their psychosocial pain. This last one is also known as "suffering" and it is reflection of how physical pain affects you socially and psychologically. From now on, report them separately.  From this point on, when asked to report your pain level, report only your physical pain. Use the following table for reference.  Pain Clinic Pain Levels (0-5/10)  Pain Level Score  Description  No Pain 0   Mild pain 1 Nagging, annoying, but does not interfere with basic activities of daily living (ADL). Patients are able to eat, bathe, get dressed, toileting (being able to get on and off the toilet and perform personal hygiene functions), transfer (move in and out of bed or a chair without assistance), and maintain continence (able to control bladder and bowel functions). Blood pressure and heart rate are unaffected. A normal heart rate for a healthy adult ranges from 60 to 100 bpm (beats per minute).   Mild to moderate pain 2 Noticeable   and distracting. Impossible to hide from other people. More frequent flare-ups. Still possible to adapt and function close to normal. It can be very annoying and may have occasional stronger flare-ups. With discipline, patients may get used to it and adapt.   Moderate pain 3 Interferes significantly with activities of daily living (ADL). It becomes difficult to feed, bathe, get dressed, get on and off the toilet or to perform personal hygiene functions. Difficult to get in and out of bed or a chair without assistance. Very distracting. With effort, it can be ignored when deeply involved in activities.   Moderately severe pain 4 Impossible to ignore for more than a few minutes. With effort, patients may still be able to  manage work or participate in some social activities. Very difficult to concentrate. Signs of autonomic nervous system discharge are evident: dilated pupils (mydriasis); mild sweating (diaphoresis); sleep interference. Heart rate becomes elevated (>115 bpm). Diastolic blood pressure (lower number) rises above 100 mmHg. Patients find relief in laying down and not moving.   Severe pain 5 Intense and extremely unpleasant. Associated with frowning face and frequent crying. Pain overwhelms the senses.  Ability to do any activity or maintain social relationships becomes significantly limited. Conversation becomes difficult. Pacing back and forth is common, as getting into a comfortable position is nearly impossible. Pain wakes you up from deep sleep. Physical signs will be obvious: pupillary dilation; increased sweating; goosebumps; brisk reflexes; cold, clammy hands and feet; nausea, vomiting or dry heaves; loss of appetite; significant sleep disturbance with inability to fall asleep or to remain asleep. When persistent, significant weight loss is observed due to the complete loss of appetite and sleep deprivation.  Blood pressure and heart rate becomes significantly elevated. Caution: If elevated blood pressure triggers a pounding headache associated with blurred vision, then the patient should immediately seek attention at an urgent or emergency care unit, as these may be signs of an impending stroke.    Emergency Department Pain Levels (6-10/10)  Emergency Room Pain 6 Severely limiting. Requires emergency care and should not be seen or managed at an outpatient pain management facility. Communication becomes difficult and requires great effort. Assistance to reach the emergency department may be required. Facial flushing and profuse sweating along with potentially dangerous increases in heart rate and blood pressure will be evident.   Distressing pain 7 Self-care is very difficult. Assistance is required to  transport, or use restroom. Assistance to reach the emergency department will be required. Tasks requiring coordination, such as bathing and getting dressed become very difficult.   Disabling pain 8 Self-care is no longer possible. At this level, pain is disabling. The individual is unable to do even the most "basic" activities such as walking, eating, bathing, dressing, transferring to a bed, or toileting. Fine motor skills are lost. It is difficult to think clearly.   Incapacitating pain 9 Pain becomes incapacitating. Thought processing is no longer possible. Difficult to remember your own name. Control of movement and coordination are lost.   The worst pain imaginable 10 At this level, most patients pass out from pain. When this level is reached, collapse of the autonomic nervous system occurs, leading to a sudden drop in blood pressure and heart rate. This in turn results in a temporary and dramatic drop in blood flow to the brain, leading to a loss of consciousness. Fainting is one of the body's self defense mechanisms. Passing out puts the brain in a calmed state and causes it to shut down   for a while, in order to begin the healing process.    Summary: 1.   Refer to this scale when providing [place] with your pain level. 2.   Be accurate and careful when reporting your pain level. This will help with your care. 3.   Over-reporting your pain level will lead to loss of credibility. 4.   Even a level of 1/10 means that there is pain and will be treated at our facility. 5.   High, inaccurate reporting will be documented as "Symptom Exaggeration", leading to loss of credibility and suspicions of possible secondary gains such as obtaining more narcotics, or wanting to appear disabled, for fraudulent reasons. 6.   Only pain levels of 5 or below will be seen at our facility. 7.   Pain levels of 6 and above will be sent to the Emergency Department and the appointment  cancelled.  ______________________________________________________________________________________________   ____________________________________________________________________________________________  Post-procedure Information What to expect: Most procedures involve the use of a local anesthetic (numbing medicine), and a steroid (anti-inflammatory medicine).  The local anesthetics may cause temporary numbness and weakness of the legs or arms, depending on the location of the block. This numbness/weakness may last 4-6 hours, depending on the local anesthetic used. In rare instances, it can last up to 24 hours. While numb, you must be very careful not to injure the extremity.  After any procedure, you could expect the pain to get better within 15-20 minutes. This relief is temporary and may last 4-6 hours. Once the local anesthetics wears off, you could experience discomfort, possibly more than usual, for up to 10 (ten) days. In the case of radiofrequencies, it may last up to 6 weeks. Surgeries may take up to 8 weeks for the healing process. The discomfort is due to the irritation caused by needles going through skin and muscle. To minimize the discomfort, we recommend using ice the first day, and heat from then on. The ice should be applied for 15 minutes on, and 15 minutes off. Keep repeating this cycle until bedtime. Avoid applying the ice directly to the skin, to prevent frostbite. Heat should be used daily, until the pain improves (4-10 days). Be careful not to burn yourself.  Occasionally you may experience muscle spasms or cramps. These occur as a consequence of the irritation caused by the needle sticks to the muscle and the blood that will inevitably be lost into the surrounding muscle tissue. Blood tends to be very irritating to tissues, which tend to react by going into spasm. These spasms may start the same day of your procedure, but they may also take days to develop. This late onset  type of spasm or cramp is usually caused by electrolyte imbalances triggered by the steroids, at the level of the kidney. Cramps and spasms tend to respond well to muscle relaxants, multivitamins (some are triggered by the procedure, but may have their origins in vitamin deficiencies), and "Gatorade", or any sports drinks that can replenish any electrolyte imbalances. (If you are a diabetic, ask your pharmacist to get you a sugar-free brand.) Warm showers or baths may also be helpful. Stretching exercises are highly recommended.  General Instructions:  Be alert for signs of possible infection: redness, swelling, heat, red streaks, elevated temperature, and/or fever. These typically appear 4 to 6 days after the procedure. Immediately notify your doctor if you experience unusual bleeding, difficulty breathing, or loss of bowel or bladder control. If you experience increased pain, do not increase your pain medicine  intake, unless instructed by your pain physician.  Post-Procedure Care:  Be careful in moving about. Muscle spasms in the area of the injection may occur. Applying ice or heat to the area is often helpful. The incidence of spinal headaches after epidural injections ranges between 1.4% and 6%. If you develop a headache that does not seem to respond to conservative therapy, please let your physician know. This can be treated with an epidural blood patch.   Post-procedure numbness or redness is to be expected, however it should average 4 to 6 hours. If numbness and weakness of your extremities begins to develop 4 to 6 hours after your procedure, and is felt to be progressing and worsening, immediately contact your physician.  Diet:  If you experience nausea, do not eat until this sensation goes away. If you had a "Stellate Ganglion Block" for upper extremity "Reflex Sympathetic Dystrophy", do not eat or drink until your hoarseness goes away. In any case, always start with liquids first and if you  tolerate them well, then slowly progress to more solid foods.  Activity:  For the first 4 to 6 hours after the procedure, use caution in moving about as you may experience numbness and/or weakness. Use caution in cooking, using household electrical appliances, and climbing steps. If you need to reach your Doctor call our office: [phone] (During business hours) or [phone] (After business hours).  Business Hours: Monday-Thursday 8:00 am - 4:00 PM    Fridays: Closed     In case of an emergency: In case of emergency, call 911 or go to the nearest emergency room and have the physician there call us.  Interpretation of Procedure Every nerve block has two components: a diagnostic component, and a treatment component. Unrealistic expectations are the most common causes of "perceived failure".  In a perfect world, a single nerve block should be able to completely and permanently eliminate the pain. Sadly, the world is not perfect.  Most pain management nerve blocks are performed using local anesthetics and steroids. Steroids are responsible for any long-term benefit that you may experience. Their purpose is to decrease any chronic swelling that may exist in the area. Steroids begin to work immediately after being injected. However, most patients will not experience any benefits until 5 to 10 days after the injection, when the swelling has come down to the point where they can tell a difference. Steroids will only help if there is swelling to be treated. As such, they can assist with the diagnosis. If effective, they suggest an inflammatory component to the pain, and if ineffective, they rule out inflammation as the main cause or component of the problem. If the problem is one of mechanical compression, you will get no benefit from those steroids.   In the case of local anesthetics, they have a crucial role in the diagnosis of your condition. Most will begin to work within15 to 20 minutes after  injection. The duration will depend on the type used (short- vs. Long-acting). It is of outmost importance that patients keep tract of their pain, after the procedure. To assist with this matter, a "Post-procedure Pain Diary" is provided. Make sure to complete it and to bring it back to your follow-up appointment.  As long as the patient keeps accurate, detailed records of their symptoms after every procedure, and returns to have those interpreted, every procedure will provide [place] with invaluable information. Even a block that does not provide the patient with any relief, will always  provide [place] with information about the mechanism and the origin of the pain. The only time a nerve block can be considered a waste of time is when patients do not keep track of the results, or do not keep their post-procedure appointment.  Reporting the results back to your physician The Pain Score  Pain is a subjective complaint. It cannot be seen, touched, or measured. We depend entirely on the patient's report of the pain in order to assess your condition and treatment. To evaluate the pain, we use a pain scale, where "0" means "No Pain", and a "10" is "the worst possible pain that you can even imagine" (i.e. something like been eaten alive by a shark or being torn apart by a lion).   Use the Pain Scale provided. You will frequently be asked to rate your pain. Please be accurate, remember that medical decisions will be based on your responses. Please do not rate your pain above a 10. Doing so is actually interpreted as "symptom magnification" (exaggeration). To put this into perspective, when you tell us that your pain is at a 10 (ten), what you are saying is that there is nothing we can do to make this pain any worse. (Carefully think about that.) ____________________________________________________________________________________________

## 2018-08-22 NOTE — Progress Notes (Signed)
Safety precautions to be maintained throughout the outpatient stay will include: orient to surroundings, keep bed in low position, maintain call bell within reach at all times, provide assistance with transfer out of bed and ambulation.  

## 2018-08-22 NOTE — Progress Notes (Signed)
Patient's Name: Kara [name]  MRN: [id number]  Referring Provider: Tracie Harrier, MD  DOB: 1951/04/23  PCP: Tracie Harrier, MD  DOS: 08/22/2018  Note by: Gaspar Cola, MD  Service setting: Ambulatory outpatient  Specialty: Interventional Pain Management  Patient type: Established  Location: [name] (AMB) Pain Management Facility  Visit type: Interventional Procedure   Primary Reason for Visit: Interventional Pain Management Treatment. CC: Back Pain (right, lower)  Procedure:          Anesthesia, Analgesia, Anxiolysis:  Procedure #1: Type: Medial Branch Facet Block #5 Primary Purpose: Palliative Region: Lumbar Level: L2, L3, L4, L5, & S1 Medial Branch Level(s) Target Area: For Lumbar Facet blocks, the target is the groove formed by the junction of the transverse process and superior articular process. For the L5 dorsal ramus, the target is the notch between superior articular process and sacral ala. For the S1 dorsal ramus, the target is the superior and lateral edge of the posterior S1 Sacral foramen. Approach: Posterior, paramedial, percutaneous approach. Laterality: Right  Procedure #2: Type: Sacroiliac Joint Block  #5  Primary Purpose: Palliative Region: Posterior Lumbosacral Level: PSIS (Posterior Superior Iliac Spine) Sacroiliac Joint Target Area: For upper sacroiliac joint block(s), the target is the superior and posterior margin of the sacroiliac joint. Approach: Ipsilateral approach. Laterality: Right  Type: Moderate (Conscious) Sedation combined with Local Anesthesia Indication(s): Analgesia and Anxiety Route: Intravenous (IV) IV Access: Secured Sedation: Meaningful verbal contact was maintained at all times during the procedure  Local Anesthetic: Lidocaine 1-2%  Position: Prone   Indications: 1. Lumbar facet syndrome (Bilateral)   2. Lumbar facet arthropathy (L3-4, L4-5, and L5-S1) (Bilateral)   3. Spondylosis without myelopathy or radiculopathy,  lumbosacral region   4. Chronic sacroiliac joint pain (Right)   5. Other specified dorsopathies, sacral and sacrococcygeal region   6. DDD (degenerative disc disease), lumbosacral   7. Chronic low back pain (Primary Area of Pain) (Bilateral)    Pain Score: Pre-procedure: 10-Worst pain ever/10 Post-procedure: 2 /10  Pre-op Assessment:  Kara [name] is a 68 y.o. (year old), female patient, seen today for interventional treatment. She  has a past surgical history that includes Knee surgery; Carpal tunnel release (Bilateral); Colon resection sigmoid (N/A, 11/02/2016); Colostomy (N/A, 11/02/2016); Sigmoidoscopy (N/A, 11/13/2016); PULMONARY VENOGRAPHY (N/A, 11/19/2016); Colonoscopy with propofol (N/A, 02/03/2017); Colon surgery (11/02/2016); IVC FILTER INSERTION (Right, 10/2016); Colostomy reversal (N/A, 02/24/2017); Appendectomy (N/A, 02/24/2017); Lysis of adhesion (N/A, 02/24/2017); laparoscopy (N/A, 02/24/2017); Ileo loop diversion (N/A, 02/24/2017); Sigmoidoscopy (N/A, 05/19/2017); Ileostomy closure (N/A, 06/01/2017); IVC FILTER REMOVAL (N/A, 08/09/2017); and Sacroplasty (N/A, 11/08/2017). Kara [name] has a current medication list which includes the following prescription(s): aspirin ec, candesartan, cyclobenzaprine, gabapentin, glipizide, meclizine, multi-vitamins, ropinirole, warfarin, and hydrocodone-acetaminophen. Her primarily concern today is the Back Pain (right, lower)  Initial Vital Signs:  Pulse/HCG Rate: 93ECG Heart Rate: 91 Temp: 97.9 F (36.6 C) Resp: 20 BP: 135/76 SpO2: 98 %  BMI: Estimated body mass index is 39.86 kg/m as calculated from the following:   Height as of this encounter: 5\' 3"  (1.6 m).   Weight as of this encounter: 225 lb (102.1 kg).  Risk Assessment: Allergies: Reviewed. She is allergic to adhesive [tape]; other; latex; and morphine and related.  Allergy Precautions: None required Coagulopathies: Reviewed. None identified.  Blood-thinner therapy: None at  this time Active Infection(s): Reviewed. None identified. Kara [name] is afebrile  Site Confirmation: Kara [name] was asked to confirm the procedure and laterality before marking the site Procedure checklist: Completed Consent: Before  the procedure and under the influence of no sedative(s), amnesic(s), or anxiolytics, the patient was informed of the treatment options, risks and possible complications. To fulfill our ethical and legal obligations, as recommended by the American Medical Association's Code of Ethics, I have informed the patient of my clinical impression; the nature and purpose of the treatment or procedure; the risks, benefits, and possible complications of the intervention; the alternatives, including doing nothing; the risk(s) and benefit(s) of the alternative treatment(s) or procedure(s); and the risk(s) and benefit(s) of doing nothing. The patient was provided information about the general risks and possible complications associated with the procedure. These may include, but are not limited to: failure to achieve desired goals, infection, bleeding, organ or nerve damage, allergic reactions, paralysis, and death. In addition, the patient was informed of those risks and complications associated to Spine-related procedures, such as failure to decrease pain; infection (i.e.: Meningitis, epidural or intraspinal abscess); bleeding (i.e.: epidural hematoma, subarachnoid hemorrhage, or any other type of intraspinal or peri-dural bleeding); organ or nerve damage (i.e.: Any type of peripheral nerve, nerve root, or spinal cord injury) with subsequent damage to sensory, motor, and/or autonomic systems, resulting in permanent pain, numbness, and/or weakness of one or several areas of the body; allergic reactions; (i.e.: anaphylactic reaction); and/or death. Furthermore, the patient was informed of those risks and complications associated with the medications. These include, but are not  limited to: allergic reactions (i.e.: anaphylactic or anaphylactoid reaction(s)); adrenal axis suppression; blood sugar elevation that in diabetics may result in ketoacidosis or comma; water retention that in patients with history of congestive heart failure may result in shortness of breath, pulmonary edema, and decompensation with resultant heart failure; weight gain; swelling or edema; medication-induced neural toxicity; particulate matter embolism and blood vessel occlusion with resultant organ, and/or nervous system infarction; and/or aseptic necrosis of one or more joints. Finally, the patient was informed that Medicine is not an exact science; therefore, there is also the possibility of unforeseen or unpredictable risks and/or possible complications that may result in a catastrophic outcome. The patient indicated having understood very clearly. We have given the patient no guarantees and we have made no promises. Enough time was given to the patient to ask questions, all of which were answered to the patient's satisfaction. Ms. Guggisberg has indicated that she wanted to continue with the procedure. Attestation: I, the ordering provider, attest that I have discussed with the patient the benefits, risks, side-effects, alternatives, likelihood of achieving goals, and potential problems during recovery for the procedure that I have provided informed consent. Date  Time: 08/22/2018  8:30 AM  Pre-Procedure Preparation:  Monitoring: As per clinic protocol. Respiration, ETCO2, SpO2, BP, heart rate and rhythm monitor placed and checked for adequate function Safety Precautions: Patient was assessed for positional comfort and pressure points before starting the procedure. Time-out: I initiated and conducted the "Time-out" before starting the procedure, as per protocol. The patient was asked to participate by confirming the accuracy of the "Time Out" information. Verification of the correct person, site,  and procedure were performed and confirmed by me, the nursing staff, and the patient. "Time-out" conducted as per Joint Commission's Universal Protocol (UP.01.01.01). Time: 0903  Description of Procedure #1:   Time-out: "Time-out" completed before starting procedure, as per protocol. Area Prepped: Entire Posterior Lumbosacral Region Prepping solution: ChloraPrep (2% chlorhexidine gluconate and 70% isopropyl alcohol) Safety Precautions: Aspiration looking for blood return was conducted prior to all injections. At no point did we inject any substances,  as a needle was being advanced. No attempts were made at seeking any paresthesias. Safe injection practices and needle disposal techniques used. Medications properly checked for expiration dates. SDV (single dose vial) medications used.  Description of the Procedure: Protocol guidelines were followed. The patient was placed in position over the fluoroscopy table. The target area was identified and the area prepped in the usual manner. Skin & deeper tissues infiltrated with local anesthetic. Appropriate amount of time allowed to pass for local anesthetics to take effect. The procedure needle was introduced through the skin, ipsilateral to the reported pain, and advanced to the target area. Employing the "Medial Branch Technique", the needles were advanced to the angle made by the superior and medial portion of the transverse process, and the lateral and inferior portion of the superior articulating process of the targeted vertebral bodies. This area is known as "Burton's Eye" or the "Eye of the [place] Dog". A procedure needle was introduced through the skin, and this time advanced to the angle made by the superior and medial border of the sacral ala, and the lateral border of the S1 vertebral body. This last needle was later repositioned at the superior and lateral border of the posterior S1 foramen. Negative aspiration confirmed. Solution injected in  intermittent fashion, asking for systemic symptoms every 0.5cc of injectate. The needles were then removed and the area cleansed, making sure to leave some of the prepping solution back to take advantage of its long term bactericidal properties. Start Time: 0904 hrs. Materials:  Needle(s) Type: Spinal Needle Gauge: 22G Length: 7-in Medication(s): Please see orders for medications and dosing details.  Description of Procedure # 2:   Area Prepped: Entire Posterior Lumbosacral Region Prepping solution: ChloraPrep (2% chlorhexidine gluconate and 70% isopropyl alcohol) Safety Precautions: Aspiration looking for blood return was conducted prior to all injections. At no point did we inject any substances, as a needle was being advanced. No attempts were made at seeking any paresthesias. Safe injection practices and needle disposal techniques used. Medications properly checked for expiration dates. SDV (single dose vial) medications used. Description of the Procedure: Protocol guidelines were followed. The patient was placed in position over the fluoroscopy table. The target area was identified and the area prepped in the usual manner. Skin & deeper tissues infiltrated with local anesthetic. Appropriate amount of time allowed to pass for local anesthetics to take effect. The procedure needle was advanced under fluoroscopic guidance into the sacroiliac joint until a firm endpoint was obtained. Proper needle placement secured. Negative aspiration confirmed. Solution injected in intermittent fashion, asking for systemic symptoms every 0.5cc of injectate. The needles were then removed and the area cleansed, making sure to leave some of the prepping solution back to take advantage of its long term bactericidal properties. Vitals:   08/22/18 0826 08/22/18 0905 08/22/18 0910 08/22/18 0914  BP: 135/76 131/73 127/75 119/69  Pulse: 93     Resp:  20 14 19   Temp: 97.9 F (36.6 C)     SpO2: 98% 96% 96% 95%  Weight:  225 lb (102.1 kg)     Height: 5\' 3"  (1.6 m)       End Time: 0913 hrs. Materials:  Needle(s) Type: Spinal Needle Gauge: 22G Length: 7-in Medication(s): Please see orders for medications and dosing details.  Imaging Guidance (Spinal):          Type of Imaging Technique: Fluoroscopy Guidance (Spinal) Indication(s): Assistance in needle guidance and placement for procedures requiring needle placement in or near  specific anatomical locations not easily accessible without such assistance. Exposure Time: Please see nurses notes. Contrast: None used. Fluoroscopic Guidance: I was personally present during the use of fluoroscopy. "Tunnel Vision Technique" used to obtain the best possible view of the target area. Parallax error corrected before commencing the procedure. "Direction-depth-direction" technique used to introduce the needle under continuous pulsed fluoroscopy. Once target was reached, antero-posterior, oblique, and lateral fluoroscopic projection used confirm needle placement in all planes. Images permanently stored in EMR. Interpretation: No contrast injected. I personally interpreted the imaging intraoperatively. Adequate needle placement confirmed in multiple planes. Permanent images saved into the patient's record.  Antibiotic Prophylaxis:   Anti-infectives (From admission, onward)   None     Indication(s): None identified  Post-operative Assessment:  Post-procedure Vital Signs:  Pulse/HCG Rate: 9388 Temp: 97.9 F (36.6 C) Resp: 19 BP: 119/69 SpO2: 95 %  EBL: None  Complications: No immediate post-treatment complications observed by team, or reported by patient.  Note: The patient tolerated the entire procedure well. A repeat set of vitals were taken after the procedure and the patient was kept under observation following institutional policy, for this type of procedure. Post-procedural neurological assessment was performed, showing return to baseline, prior to discharge.  The patient was provided with post-procedure discharge instructions, including a section on how to identify potential problems. Should any problems arise concerning this procedure, the patient was given instructions to immediately contact us, at any time, without hesitation. In any case, we plan to contact the patient by telephone for a follow-up status report regarding this interventional procedure.  Comments:  No additional relevant information.  Plan of Care  Orders:  Orders Placed This Encounter  Procedures  . LUMBAR FACET(MEDIAL BRANCH NERVE BLOCK) MBNB    Scheduling Instructions:     Side: Right-sided     Level: L3-4, L4-5, & L5-S1 Facets (L2, L3, L4, L5, & S1 Medial Branch Nerves)     Sedation: No Sedation.     Timeframe: Today    Order Specific Question:   Where will this procedure be performed?    Answer:   [name]  . SACROILIAC JOINT INJECTION    Scheduling Instructions:     Side: Right-sided     Sedation: No Sedation.     Timeframe: Today    Order Specific Question:   Where will this procedure be performed?    Answer:   [name]  . DG C-Arm 1-60 Min-No Report    Intraoperative interpretation by procedural physician at [place].    Standing Status:   Standing    Number of Occurrences:   1    Order Specific Question:   Reason for exam:    Answer:   Assistance in needle guidance and placement for procedures requiring needle placement in or near specific anatomical locations not easily accessible without such assistance.  . Provider attestation of informed consent for procedure/surgical case    I, the ordering provider, attest that I have discussed with the patient the benefits, risks, side effects, alternatives, likelihood of achieving goals and potential problems during recovery for the procedure that I have provided informed consent.    Standing Status:   Standing    Number of Occurrences:   1  . Informed Consent Details: Transcribe to  consent form and obtain patient signature    Standing Status:   Standing    Number of Occurrences:   1    Order Specific Question:   Procedure  Answer:   Lumbar facet block (medial branch block) under fluoroscopic guidance. (See notes for levels and laterality.)    Order Specific Question:   Surgeon    Answer:   Donnis Phaneuf A. Dossie Arbour, MD    Order Specific Question:   Indication/Reason    Answer:   Low back pain with or without lower extremity pain.  . Informed Consent Details: Transcribe to consent form and obtain patient signature    Consent Attestation: I, the ordering provider, attest that I have discussed with the patient the benefits, risks, side-effects, alternatives, likelihood of achieving goals, and potential problems during recovery for the procedure that I have provided informed consent.    Standing Status:   Standing    Number of Occurrences:   1    Order Specific Question:   Procedure    Answer:   Diagnostic, right-sided, Sacroiliac joint injection under fluoroscopic guidance    Order Specific Question:   Surgeon    Answer:   Tiberius Loftus A. Dossie Arbour, MD    Order Specific Question:   Indication/Reason    Answer:   Low back pain secondary sleep to sacroiliac joint dysfunction/arthralgia  . Care order/instruction: Please confirm that the patient has stopped the Coumadin (Warfarin) X 5 days prior to procedure or surgery.    Please confirm that the patient has stopped the Coumadin (Warfarin) X 5 days prior to procedure or surgery.    Standing Status:   Standing    Number of Occurrences:   1  . Latex precautions    Activate Latex-Free Protocol.    Standing Status:   Standing    Number of Occurrences:   1  . Bleeding precautions    Standing Status:   Standing    Number of Occurrences:   1   Medications ordered for procedure: Meds ordered this encounter  Medications  . ropivacaine (PF) 2 mg/mL (0.2%) (NAROPIN) injection 9 mL  . triamcinolone acetonide (KENALOG-40) injection 40  mg  . ropivacaine (PF) 2 mg/mL (0.2%) (NAROPIN) injection 4 mL  . methylPREDNISolone acetate (DEPO-MEDROL) injection 80 mg   Medications administered: We administered ropivacaine (PF) 2 mg/mL (0.2%), triamcinolone acetonide, ropivacaine (PF) 2 mg/mL (0.2%), and methylPREDNISolone acetate.  See the medical record for exact dosing, route, and time of administration.  Disposition: Discharge home  Discharge Date & Time: 08/22/2018; 0930 hrs.   Follow-up plan:   Return for EPP (2 wks) w/ NP (Virtual).     No future appointments. Primary Care Physician: Tracie Harrier, MD Location: [facility] Note by: Gaspar Cola, MD Date: 08/22/2018; Time: 9:23 AM  Disclaimer:  Medicine is not an Chief Strategy Officer. The only guarantee in medicine is that nothing is guaranteed. It is important to note that the decision to proceed with this intervention was based on the information collected from the patient. The Data and conclusions were drawn from the patient's questionnaire, the interview, and the physical examination. Because the information was provided in large part by the patient, it cannot be guaranteed that it has not been purposely or unconsciously manipulated. Every effort has been made to obtain as much relevant data as possible for this evaluation. It is important to note that the conclusions that lead to this procedure are derived in large part from the available data. Always take into account that the treatment will also be dependent on availability of resources and existing treatment guidelines, considered by other Pain Management Practitioners as being common knowledge and practice, at the time of  the intervention. For Medico-Legal purposes, it is also important to point out that variation in procedural techniques and pharmacological choices are the acceptable norm. The indications, contraindications, technique, and results of the above procedure should only be  interpreted and judged by a Board-Certified Interventional Pain Specialist with extensive familiarity and expertise in the same exact procedure and technique.

## 2018-08-24 ENCOUNTER — Emergency Department: Payer: PPO

## 2018-08-24 ENCOUNTER — Inpatient Hospital Stay
Admission: EM | Admit: 2018-08-24 | Discharge: 2018-08-26 | DRG: 389 | Disposition: A | Discharge status: Home / Self Care | Payer: PPO | Attending: Surgery | Admitting: Surgery

## 2018-08-24 ENCOUNTER — Inpatient Hospital Stay: Payer: PPO

## 2018-08-24 ENCOUNTER — Other Ambulatory Visit: Payer: Self-pay

## 2018-08-24 DIAGNOSIS — K565 Intestinal adhesions [bands], unspecified as to partial versus complete obstruction: Secondary | ICD-10-CM | POA: Diagnosis not present

## 2018-08-24 DIAGNOSIS — R112 Nausea with vomiting, unspecified: Secondary | ICD-10-CM

## 2018-08-24 DIAGNOSIS — M4316 Spondylolisthesis, lumbar region: Secondary | ICD-10-CM | POA: Diagnosis present

## 2018-08-24 DIAGNOSIS — Z6839 Body mass index (BMI) 39.0-39.9, adult: Secondary | ICD-10-CM | POA: Diagnosis not present

## 2018-08-24 DIAGNOSIS — Z0189 Encounter for other specified special examinations: Secondary | ICD-10-CM | POA: Diagnosis not present

## 2018-08-24 DIAGNOSIS — Z86711 Personal history of pulmonary embolism: Secondary | ICD-10-CM | POA: Diagnosis not present

## 2018-08-24 DIAGNOSIS — Z8614 Personal history of Methicillin resistant Staphylococcus aureus infection: Secondary | ICD-10-CM | POA: Diagnosis not present

## 2018-08-24 DIAGNOSIS — Z4659 Encounter for fitting and adjustment of other gastrointestinal appliance and device: Secondary | ICD-10-CM

## 2018-08-24 DIAGNOSIS — E1165 Type 2 diabetes mellitus with hyperglycemia: Secondary | ICD-10-CM | POA: Diagnosis present

## 2018-08-24 DIAGNOSIS — K439 Ventral hernia without obstruction or gangrene: Secondary | ICD-10-CM | POA: Diagnosis present

## 2018-08-24 DIAGNOSIS — Z86718 Personal history of other venous thrombosis and embolism: Secondary | ICD-10-CM | POA: Diagnosis not present

## 2018-08-24 DIAGNOSIS — Z9049 Acquired absence of other specified parts of digestive tract: Secondary | ICD-10-CM | POA: Diagnosis not present

## 2018-08-24 DIAGNOSIS — M47817 Spondylosis without myelopathy or radiculopathy, lumbosacral region: Secondary | ICD-10-CM | POA: Diagnosis present

## 2018-08-24 DIAGNOSIS — K760 Fatty (change of) liver, not elsewhere classified: Secondary | ICD-10-CM | POA: Diagnosis present

## 2018-08-24 DIAGNOSIS — M1611 Unilateral primary osteoarthritis, right hip: Secondary | ICD-10-CM | POA: Diagnosis present

## 2018-08-24 DIAGNOSIS — I5022 Chronic systolic (congestive) heart failure: Secondary | ICD-10-CM | POA: Diagnosis present

## 2018-08-24 DIAGNOSIS — K56609 Unspecified intestinal obstruction, unspecified as to partial versus complete obstruction: Secondary | ICD-10-CM

## 2018-08-24 DIAGNOSIS — Z7982 Long term (current) use of aspirin: Secondary | ICD-10-CM | POA: Diagnosis not present

## 2018-08-24 DIAGNOSIS — M5137 Other intervertebral disc degeneration, lumbosacral region: Secondary | ICD-10-CM | POA: Diagnosis present

## 2018-08-24 DIAGNOSIS — G4762 Sleep related leg cramps: Secondary | ICD-10-CM | POA: Diagnosis present

## 2018-08-24 DIAGNOSIS — K5651 Intestinal adhesions [bands], with partial obstruction: Secondary | ICD-10-CM | POA: Diagnosis present

## 2018-08-24 DIAGNOSIS — I110 Hypertensive heart disease with heart failure: Secondary | ICD-10-CM | POA: Diagnosis present

## 2018-08-24 DIAGNOSIS — E1142 Type 2 diabetes mellitus with diabetic polyneuropathy: Secondary | ICD-10-CM | POA: Diagnosis present

## 2018-08-24 DIAGNOSIS — Z7984 Long term (current) use of oral hypoglycemic drugs: Secondary | ICD-10-CM | POA: Diagnosis not present

## 2018-08-24 DIAGNOSIS — K8020 Calculus of gallbladder without cholecystitis without obstruction: Secondary | ICD-10-CM | POA: Diagnosis present

## 2018-08-24 DIAGNOSIS — G894 Chronic pain syndrome: Secondary | ICD-10-CM | POA: Diagnosis present

## 2018-08-24 DIAGNOSIS — M4807 Spinal stenosis, lumbosacral region: Secondary | ICD-10-CM | POA: Diagnosis present

## 2018-08-24 LAB — COMPREHENSIVE METABOLIC PANEL
ALT: 53 U/L — ABNORMAL HIGH (ref 0–44)
AST: 44 U/L — ABNORMAL HIGH (ref 15–41)
Albumin: 3.9 g/dL (ref 3.5–5.0)
Alkaline Phosphatase: 60 U/L (ref 38–126)
Anion gap: 10 (ref 5–15)
BUN: 16 mg/dL (ref 8–23)
CO2: 26 mmol/L (ref 22–32)
Calcium: 9.3 mg/dL (ref 8.9–10.3)
Chloride: 103 mmol/L (ref 98–111)
Creatinine, Ser: 0.71 mg/dL (ref 0.44–1.00)
GFR calc Af Amer: >60 mL/min (ref >60)
GFR calc non Af Amer: >60 mL/min (ref >60)
Glucose, Bld: 295 mg/dL — ABNORMAL HIGH (ref 70–99)
Potassium: 3.9 mmol/L (ref 3.5–5.1)
Sodium: 139 mmol/L (ref 135–145)
Total Bilirubin: 0.9 mg/dL (ref 0.3–1.2)
Total Protein: 7.7 g/dL (ref 6.5–8.1)

## 2018-08-24 LAB — CBC WITH DIFFERENTIAL/PLATELET
Abs Immature Granulocytes: 0.04 10*3/uL (ref 0.00–0.07)
Basophils Absolute: 0 10*3/uL (ref 0.0–0.1)
Basophils Relative: 0 %
Eosinophils Absolute: 0 10*3/uL (ref 0.0–0.5)
Eosinophils Relative: 0 %
HCT: 41.9 % (ref 36.0–46.0)
Hemoglobin: 13.5 g/dL (ref 12.0–15.0)
Immature Granulocytes: 0 %
Lymphocytes Relative: 14 %
Lymphs Abs: 1.4 10*3/uL (ref 0.7–4.0)
MCH: 27.6 pg (ref 26.0–34.0)
MCHC: 32.2 g/dL (ref 30.0–36.0)
MCV: 85.5 fL (ref 80.0–100.0)
Monocytes Absolute: 0.6 10*3/uL (ref 0.1–1.0)
Monocytes Relative: 7 %
Neutro Abs: 7.4 10*3/uL (ref 1.7–7.7)
Neutrophils Relative %: 79 %
Platelets: 269 10*3/uL (ref 150–400)
RBC: 4.9 MIL/uL (ref 3.87–5.11)
RDW: 15.5 % (ref 11.5–15.5)
WBC: 9.5 10*3/uL (ref 4.0–10.5)
nRBC: 0 % (ref 0.0–0.2)

## 2018-08-24 LAB — URINALYSIS, COMPLETE (UACMP) WITH MICROSCOPIC
Bilirubin Urine: NEGATIVE
Glucose, UA: >=500 mg/dL — AB
Ketones, ur: 5 mg/dL — AB
Nitrite: NEGATIVE
Protein, ur: NEGATIVE mg/dL
Specific Gravity, Urine: 1.021 (ref 1.005–1.030)
pH: 6 (ref 5.0–8.0)

## 2018-08-24 LAB — PROTIME-INR
INR: 1 (ref 0.8–1.2)
Prothrombin Time: 13.5 seconds (ref 11.4–15.2)

## 2018-08-24 LAB — GLUCOSE, CAPILLARY
Glucose-Capillary: 238 mg/dL — ABNORMAL HIGH (ref 70–99)
Glucose-Capillary: 209 mg/dL — ABNORMAL HIGH (ref 70–99)
Glucose-Capillary: 185 mg/dL — ABNORMAL HIGH (ref 70–99)
Glucose-Capillary: 153 mg/dL — ABNORMAL HIGH (ref 70–99)

## 2018-08-24 LAB — MRSA PCR SCREENING: MRSA by PCR: POSITIVE — AB

## 2018-08-24 LAB — TROPONIN I: Troponin I: <0.03 ng/mL

## 2018-08-24 LAB — LIPASE, BLOOD: Lipase: 22 U/L (ref 11–51)

## 2018-08-24 MED ORDER — FAMOTIDINE IN NACL 20-0.9 MG/50ML-% IV SOLN
20.0000 mg | Freq: Once | INTRAVENOUS | Status: AC
  Administered 2018-08-24: 08:00:00 20 mg via INTRAVENOUS
  Filled 2018-08-24: qty 50

## 2018-08-24 MED ORDER — SODIUM CHLORIDE 0.9 % IV BOLUS
1000.0000 mL | Freq: Once | INTRAVENOUS | Status: AC
  Administered 2018-08-24: 08:00:00 1000 mL via INTRAVENOUS

## 2018-08-24 MED ORDER — KETOROLAC TROMETHAMINE 15 MG/ML IJ SOLN
15.0000 mg | Freq: Once | INTRAMUSCULAR | Status: AC
  Administered 2018-08-24: 21:00:00 15 mg via INTRAVENOUS
  Filled 2018-08-24: qty 1

## 2018-08-24 MED ORDER — IOHEXOL 300 MG/ML  SOLN
100.0000 mL | Freq: Once | INTRAMUSCULAR | Status: AC | PRN
  Administered 2018-08-24: 100 mL via INTRAVENOUS

## 2018-08-24 MED ORDER — LACTATED RINGERS IV SOLN
INTRAVENOUS | Status: DC
  Administered 2018-08-24 – 2018-08-26 (×5): via INTRAVENOUS

## 2018-08-24 MED ORDER — METOCLOPRAMIDE HCL 5 MG/ML IJ SOLN
10.0000 mg | Freq: Once | INTRAMUSCULAR | Status: AC
  Administered 2018-08-24: 09:00:00 10 mg via INTRAVENOUS
  Filled 2018-08-24: qty 2

## 2018-08-24 MED ORDER — ONDANSETRON HCL 4 MG/2ML IJ SOLN
4.0000 mg | Freq: Once | INTRAMUSCULAR | Status: AC
  Administered 2018-08-24: 08:00:00 4 mg via INTRAVENOUS
  Filled 2018-08-24: qty 2

## 2018-08-24 MED ORDER — CHLORHEXIDINE GLUCONATE CLOTH 2 % EX PADS: 6.0000 | MEDICATED_PAD | Freq: Every day | CUTANEOUS | Status: DC

## 2018-08-24 MED ORDER — LIDOCAINE HCL URETHRAL/MUCOSAL 2 % EX GEL
1.0000 "application " | Freq: Once | CUTANEOUS | Status: AC
  Administered 2018-08-24: 1 via INTRATRACHEAL
  Filled 2018-08-24: qty 5

## 2018-08-24 MED ORDER — PHENOL 1.4 % MT LIQD
1.0000 | OROMUCOSAL | Status: DC | PRN
  Filled 2018-08-24: qty 177

## 2018-08-24 MED ORDER — LIDOCAINE VISCOUS HCL 2 % MT SOLN
15.0000 mL | Freq: Once | OROMUCOSAL | Status: AC
  Administered 2018-08-24: 14:00:00 15 mL via OROMUCOSAL
  Filled 2018-08-24 (×2): qty 15

## 2018-08-24 MED ORDER — INSULIN ASPART 100 UNIT/ML ~~LOC~~ SOLN
0.0000 [IU] | SUBCUTANEOUS | Status: DC
  Administered 2018-08-24: 13:00:00 5 [IU] via SUBCUTANEOUS
  Administered 2018-08-24: 20:00:00 3 [IU] via SUBCUTANEOUS
  Administered 2018-08-24: 16:00:00 5 [IU] via SUBCUTANEOUS
  Administered 2018-08-25: 3 [IU] via SUBCUTANEOUS
  Administered 2018-08-25 (×4): 2 [IU] via SUBCUTANEOUS
  Filled 2018-08-24 (×7): qty 1

## 2018-08-24 MED ORDER — HYDRALAZINE HCL 20 MG/ML IJ SOLN: 10.0000 mg | Freq: Four times a day (QID) | INTRAMUSCULAR | Status: DC | PRN

## 2018-08-24 MED ORDER — MUPIROCIN 2 % EX OINT
1.0000 "application " | TOPICAL_OINTMENT | Freq: Two times a day (BID) | CUTANEOUS | Status: DC
  Administered 2018-08-24 – 2018-08-26 (×4): 1 via NASAL
  Filled 2018-08-24: qty 22

## 2018-08-24 MED ORDER — ENOXAPARIN SODIUM 40 MG/0.4ML ~~LOC~~ SOLN
40.0000 mg | SUBCUTANEOUS | Status: DC
  Administered 2018-08-24 – 2018-08-25 (×2): 40 mg via SUBCUTANEOUS
  Filled 2018-08-24 (×2): qty 0.4

## 2018-08-24 NOTE — Progress Notes (Addendum)
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 Nasal ointment ordered. CHG wipes D/C as the patient is allergic to them. Ng tube was inserted.

## 2018-08-24 NOTE — Progress Notes (Signed)
Notified: PA about the patient refusing and NG tube per ED nurse. Patient indicates she had a large bowel movement before arriving to the unit. Nurse spoke with the patient about the possibility of ordering lidocaine cream/spray to help numb site before the insertion of the NG tube. The patient indicated that she would try it if ordered and still needed to have the NG tube.

## 2018-08-24 NOTE — Progress Notes (Signed)
Patient discussed with Dr. Alfred Levins, CT and labs reviewed, chart briefly reviewed. 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 Will admit to surgical service with full H&P to follow.  -- Marilynne Drivers Rosana Hoes, MD, [place]: [facility] - Partnering for exceptional care. Office: [phone]

## 2018-08-24 NOTE — ED Notes (Signed)
Voided on bedpan

## 2018-08-24 NOTE — ED Notes (Signed)
Patient had voided in attends.  Changed to diaper.

## 2018-08-24 NOTE — ED Notes (Signed)
Patient up to toilet to pass large amount soft and liquid stool and gas.  She got back to bed, but had to go back and have more stool.

## 2018-08-24 NOTE — ED Notes (Signed)
Patient says she continues to be nauseated.

## 2018-08-24 NOTE — ED Notes (Signed)
Attempted to put ng in, but patient said she would refuse.  Says they will have to put her out to do that.  I only got into nare when she refused.  Took to floor via wheelchair.

## 2018-08-24 NOTE — ED Notes (Signed)
ED TO INPATIENT HANDOFF REPORT  ED Nurse Name and Phone #: Kris Mouton [phone]  S Name/Age/Gender Kara [name] 68 y.o. female Room/Bed: ED15A/ED15A  Code Status   Code Status: Full Code  Home/SNF/Other Home Patient oriented to: place and situation Is this baseline? Yes   Triage Complete: Triage complete  Chief Complaint emesis  Triage Note Pt arrived via [facility] from home with c/o N/V since 2 am this morning. EMS states that pt woke up to go to the bathroom and then started vomiting. EMS states pt vomited x6-7 times. EMS gave 4 mg zofran en route.    Allergies Allergies  Allergen Reactions  . Adhesive [Tape] Other (See Comments)    Pt reports, when removing tape from skin most tapes pull her skin off. Ok to use paper tape  . Other Hives and Other (See Comments)    Chlorhexadine wipes/CHG wipes, Pt reports, when removing tape from skin most tapes pull her skin off. Ok to use paper tape  . Latex Rash  . Morphine And Related Nausea And Vomiting    Level of Care/Admitting Diagnosis ED Disposition    ED Disposition Condition [facility] Area: [place] [[id number]]  Level of Care: Med-Surg [16]  Diagnosis: Small bowel obstruction due to adhesions [facility]) [[id number]]  Admitting Physician: Vickie Epley [[id number]]  Attending Physician: Vickie Epley [[id number]]  Estimated length of stay: 3 - 4 days  Certification:: I certify this patient will need inpatient services for at least 2 midnights  PT Class (Do Not Modify): Inpatient [101]  PT Acc Code (Do Not Modify): Private [1]       B Medical/Surgery History Past Medical History:  Diagnosis Date  . Anginal pain ([place])   . 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 CHF (congestive heart failure) ([place])   . Diabetes ([place])   . Diverticulitis   . Hypertension   . PE (pulmonary thromboembolism) ([place])   . 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[facility])     Past Surgical History:  Procedure Laterality Date  . APPENDECTOMY N/A 02/24/2017   Procedure: Incidental  APPENDECTOMY;  Surgeon: [name], MD;  Location: [name] ORS;  Service: General;  Laterality: N/A;  . CARPAL TUNNEL RELEASE Bilateral   . COLON RESECTION SIGMOID N/A 11/02/2016   Procedure: COLON RESECTION SIGMOID;  Surgeon: [name], MD;  Location: [name] ORS;  Service: General;  Laterality: N/A;  . COLON SURGERY  11/02/2016  . COLONOSCOPY WITH PROPOFOL N/A 02/03/2017   Procedure: COLONOSCOPY WITH PROPOFOL;  Surgeon: Lin Landsman, MD;  Location[facility] END[facility]COPY;  Service: Gastroenterology;  Laterality: N/A;  . COLOSTOMY N/A 11/02/2016   Procedure: COLOSTOMY;  Surgeon: [name], MD;  Location: [name] ORS;  Service: General;  Laterality: N/A;  . COLOSTOMY REVERSAL N/A 02/24/2017   Procedure: COLOSTOMY REVERSAL, ostomy takedown, spleenic flexure mobilization, excision rectal stump/distal sigmoid, anastomosis with suture reinforcement;  Surgeon: [name], MD;  Location: [name] ORS;  Service: General;  Laterality: N/A;  . ILEO LOOP DIVERSION N/A 02/24/2017   Procedure: ILEO LOOP COLOSTOMY;  Surgeon: [name], MD;  Location: [name] ORS;  Service: General;  Laterality: N/A;  . ILEOSTOMY CLOSURE N/A 06/01/2017   Procedure: LOOP ILEOSTOMY TAKEDOWN;  Surgeon: [name], MD;  Location: [name] ORS;  Service: General;  Laterality: N/A;  . IVC FILTER INSERTION Right 10/2016  . IVC FILTER REMOVAL N/A 08/09/2017   Procedure: IVC FILTER REMOVAL;  Surgeon:  Schnier, Dolores Lory, MD;  Location: [place] CV LAB;  Service: Cardiovascular;  Laterality: N/A;  . KNEE SURGERY     torn menicus  . LAPAROSCOPY N/A 02/24/2017   Procedure: LAPAROSCOPY DIAGNOSTIC;  Surgeon: [name], MD;  Location: [name] ORS;  Service: General;  Laterality: N/A;  . LYSIS OF ADHESION N/A 02/24/2017   Procedure: LYSIS OF ADHESION;  Surgeon: [name], MD;  Location: [name] ORS;  Service:  General;  Laterality: N/A;  . PULMONARY VENOGRAPHY N/A 11/19/2016   Procedure: Pulmonary Venography; IVC filter placement; possible pulmonary thrombectomy;  Surgeon: Katha Cabal, MD;  Location: [place] [facility] LAB;  Service: Cardiovascular;  Laterality: N/A;  . SACROPLASTY N/A 11/08/2017   Procedure: SACROPLASTY S2;  Surgeon: Hessie Knows, MD;  Location: [name] ORS;  Service: Orthopedics;  Laterality: N/A;  . SIGMOIDOSCOPY N/A 11/13/2016   Procedure: endoscopic  flexible SIGMOIDOSCOPY;  Surgeon: Florene Glen, MD;  Location: [name] ORS;  Service: General;  Laterality: N/A;  . SIGMOIDOSCOPY N/A 05/19/2017   Procedure: FLEXIBLE SIGMOIDOSCOPY;  Surgeon: [name], MD;  Location: [name] ORS;  Service: General;  Laterality: N/A;     A IV Location/Drains/Wounds Patient Lines/Drains/Airways Status   Active Line/Drains/Airways    Name:   Placement date:   Placement time:   Site:   Days:   Peripheral IV 10/03/17 Right Forearm   10/03/17    2213    Forearm   325   Peripheral IV 11/10/17 Right Antecubital   11/10/17    0851    Antecubital   287   Peripheral IV 08/24/18 Right Antecubital   08/24/18    -    Antecubital   less than 1   Epidural Catheter 03/16/18   03/16/18    1130     161   Epidural Catheter 05/09/18   05/09/18    1145     107   Epidural Catheter 06/08/18   06/08/18    1439     77   Open Drain 1 Right Abdomen   06/01/17    1107    Abdomen   449   Airway   -    -        Incision (Closed) 06/01/17 Abdomen Other (Comment)   06/01/17    1107     449   Incision (Closed) 11/08/17 Back   11/08/17    1536     289          Intake/Output Last 24 hours  Intake/Output Summary (Last 24 hours) at 08/24/2018 1045 Last data filed at 08/24/2018 1037 Gross per 24 hour  Intake 1000 ml  Output -  Net 1000 ml    Labs/Imaging Results for orders placed or performed during the hospital encounter of 08/24/18 (from the past 48 hour(s))  CBC with Differential/Platelet     Status: None    Collection Time: 08/24/18  7:04 AM  Result Value Ref Range   WBC 9.5 4.0 - 10.5 K/uL   RBC 4.90 3.87 - 5.11 MIL/uL   Hemoglobin 13.5 12.0 - 15.0 g/dL   HCT 41.9 36.0 - 46.0 %   MCV 85.5 80.0 - 100.0 fL   MCH 27.6 26.0 - 34.0 pg   MCHC 32.2 30.0 - 36.0 g/dL   RDW 15.5 11.5 - 15.5 %   Platelets 269 150 - 400 K/uL   nRBC 0.0 0.0 - 0.2 %   Neutrophils Relative % 79 %   Neutro Abs 7.4 1.7 - 7.7 K/uL  Lymphocytes Relative 14 %   Lymphs Abs 1.4 0.7 - 4.0 K/uL   Monocytes Relative 7 %   Monocytes Absolute 0.6 0.1 - 1.0 K/uL   Eosinophils Relative 0 %   Eosinophils Absolute 0.0 0.0 - 0.5 K/uL   Basophils Relative 0 %   Basophils Absolute 0.0 0.0 - 0.1 K/uL   Immature Granulocytes 0 %   Abs Immature Granulocytes 0.04 0.00 - 0.07 K/uL    Comment: Performed at [facility], [address] [place], [place] [address]  Comprehensive metabolic panel     Status: Abnormal   Collection Time: 08/24/18  7:04 AM  Result Value Ref Range   Sodium 139 135 - 145 mmol/L   Potassium 3.9 3.5 - 5.1 mmol/L   Chloride 103 98 - 111 mmol/L   CO2 26 22 - 32 mmol/L   Glucose, Bld 295 (H) 70 - 99 mg/dL   BUN 16 8 - 23 mg/dL   Creatinine, Ser 0.71 0.44 - 1.00 mg/dL   Calcium 9.3 8.9 - 10.3 mg/dL   Total Protein 7.7 6.5 - 8.1 g/dL   Albumin 3.9 3.5 - 5.0 g/dL   AST 44 (H) 15 - 41 U/L   ALT 53 (H) 0 - 44 U/L   Alkaline Phosphatase 60 38 - 126 U/L   Total Bilirubin 0.9 0.3 - 1.2 mg/dL   GFR calc non Af Amer >60 >60 mL/min   GFR calc Af Amer >60 >60 mL/min   Anion gap 10 5 - 15    Comment: Performed at [facility], [address] [place], [place] [address]  Lipase, blood     Status: None   Collection Time: 08/24/18  7:04 AM  Result Value Ref Range   Lipase 22 11 - 51 U/L    Comment: Performed at [facility], [address] [place], [place] [address]  Troponin I - ONCE - STAT     Status: None   Collection Time: 08/24/18  7:04 AM  Result Value Ref Range   Troponin I  <0.03 <0.03 ng/mL    Comment: Performed at [facility], [address] [place], [place] [address]  Protime-INR     Status: None   Collection Time: 08/24/18  7:04 AM  Result Value Ref Range   Prothrombin Time 13.5 11.4 - 15.2 seconds   INR 1.0 0.8 - 1.2    Comment: (NOTE) INR goal varies based on device and disease states. Performed at [facility], [address] [place],  [address]   Urinalysis, Complete w Microscopic     Status: Abnormal   Collection Time: 08/24/18  9:32 AM  Result Value Ref Range   Color, Urine YELLOW (A) YELLOW   APPearance CLOUDY (A) CLEAR   Specific Gravity, Urine 1.021 1.005 - 1.030   pH 6.0 5.0 - 8.0   Glucose, UA >=500 (A) NEGATIVE mg/dL   Hgb urine dipstick SMALL (A) NEGATIVE   Bilirubin Urine NEGATIVE NEGATIVE   Ketones, ur 5 (A) NEGATIVE mg/dL   Protein, ur NEGATIVE NEGATIVE mg/dL   Nitrite NEGATIVE NEGATIVE   Leukocytes,Ua LARGE (A) NEGATIVE   RBC / HPF 0-5 0 - 5 RBC/hpf   WBC, UA 21-50 0 - 5 WBC/hpf   Bacteria, UA RARE (A) NONE SEEN   Squamous Epithelial / LPF 0-5 0 - 5   Mucus PRESENT    Non Squamous Epithelial PRESENT (A) NONE SEEN    Comment: Performed at [facility], [address] [place], [place] [address]   Ct Abdomen Pelvis W Contrast  Result Date: 08/24/2018 CLINICAL DATA:  Pt arrived via [facility] from home with c/o N/V since 2 am this morning. EMS states that pt woke up to go to the bathroom and then started vomiting. EMS states pt vomited x6-7 times. Hx of colon resection surgery for diverticulitis.*comment was truncated*^113mL OMNIPAQUE IOHEXOL 300 MG/ML SOLNAbd pain, unspecified EXAM: CT ABDOMEN AND PELVIS WITH CONTRAST TECHNIQUE: Multidetector CT imaging of the abdomen and pelvis was performed using the standard protocol following bolus administration of intravenous contrast. CONTRAST:  1103mL OMNIPAQUE IOHEXOL 300 MG/ML  SOLN COMPARISON:  Abdominal radiograph 08/24/2018 FINDINGS: Lower chest: Lung  bases are clear. Hepatobiliary: No focal hepatic lesion. Several small gallstones in the gallbladder. No gallbladder distension. Pancreas: Pancreas is normal. No ductal dilatation. No pancreatic inflammation. Spleen: Normal spleen Adrenals/urinary tract: Adrenal glands and kidneys are normal. The ureters and bladder normal. Moderate bladder cystocele noted on the sagittal projections (image 62/6). Stomach/Bowel: Stomach and duodenum are normal. Proximal small bowel is mildly dilated 3.8 cm (image 40/5). The this quickly changes to normal caliber bowel. The most distal small bowel is decompressed. There is no focal transition point. Enteric enteric anastomosis in the RIGHT lower quadrant without obstruction. The ascending colon and transverse colon contains stool. Descending colon is collapsed. Rectosigmoid colon normal. There is a anastomosis in the sigmoid colon without complication. The appendix is not identified.  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 There is no retroperitoneal or periportal lymphadenopathy. No pelvic lymphadenopathy. Reproductive: Uterus and ovaries normal for age. Other: No free fluid. Musculoskeletal: No aggressive osseous lesion. IMPRESSION: 1. Mild dilatation of proximal small bowel without obstructing lesion identified. Findings could represent focal ileus versus mild partial obstruction. 2. distal small bowel is relatively collapsed however there is no transition point identified. 3. Enteric enteric anastomosis in RIGHT lower quadrant without obstruction. 4. Appendix not identified.  No secondary signs appendicitis. 5. Cholelithiasis without evidence of cholecystitis. 6. 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 Electronically Signed   By: Suzy Bouchard M.D.   On: 08/24/2018  10:16   Dg Abd 2 Views  Result Date: 08/24/2018 CLINICAL DATA:  Nausea and vomiting. EXAM: ABDOMEN - 2 VIEW COMPARISON:  CT of the abdomen and pelvis on 10/03/2017 FINDINGS: No evidence bowel obstruction or significant bowel dilatation. There are a few gas-filled loops of small bowel in the left lower abdomen that are not dilated but may imply some component of regional ileus or enteritis. No free intraperitoneal air, abnormal calcifications or soft tissue abnormalities. Evidence of prior bilateral sacroplasty. IMPRESSION: No evidence of bowel obstruction or perforation. Few gastric loops of small bowel in the left lower abdomen that are not dilated but may imply component of regional ileus or enteritis. Electronically Signed   By: Aletta Edouard M.D.   On: 08/24/2018 08:35   US Abdomen Limited Ruq  Result Date: 08/24/2018 CLINICAL DATA:  Upper abdominal pain and vomiting. History of cholelithiasis. EXAM: ULTRASOUND ABDOMEN LIMITED RIGHT UPPER QUADRANT COMPARISON:  CT of the abdomen on 10/03/2017. FINDINGS: Gallbladder: Several shadowing gallstones in the gallbladder lumen which appear mole bile. No evidence gallbladder wall thickening, gallbladder dilatation or pericholecystic fluid. The patient was subjectively tender over the gallbladder during the exam. Common bile duct: Diameter: 4 mm Liver: The liver demonstrates coarse echotexture and increased  echogenicity, likely reflecting diffuse steatosis. No overt cirrhotic contour abnormalities or focal lesions are identified. There is no evidence of intrahepatic biliary ductal dilatation. Portal vein is patent on color Doppler imaging with normal direction of blood flow towards the liver. IMPRESSION: 1. Cholelithiasis. Although the patient was subjectively tender over the gallbladder during the examination, there are no other signs by ultrasound of acute cholecystitis. No biliary dilatation identified. 2. Diffuse hepatic steatosis. Electronically Signed   By:  Aletta Edouard M.D.   On: 08/24/2018 08:13    Pending Labs Unresulted Labs (From admission, onward)    Start     Ordered   08/25/18 0500  CBC  Tomorrow morning,   STAT     08/24/18 1038   08/25/18 1941  Basic metabolic panel  Daily,   STAT     08/24/18 1038   08/24/18 1034  HIV antibody (Routine Testing)  Once,   STAT     08/24/18 1038          Vitals/Pain Today's Vitals   08/24/18 0705 08/24/18 0706 08/24/18 1000 08/24/18 1030  BP: (!) 141/62  (!) 166/73 (!) 167/77  Pulse: 91   99  Resp: 20     Temp: 98.2 F (36.8 C)     TempSrc: Oral     SpO2: 94%   96%  Weight:  102.1 kg    Height:  5\' 3"  (1.6 m)    PainSc: 5        Isolation Precautions No active isolations  Medications Medications  enoxaparin (LOVENOX) injection 40 mg (has no administration in time range)  lactated ringers infusion (has no administration in time range)  insulin aspart (novoLOG) injection 0-15 Units (has no administration in time range)  hydrALAZINE (APRESOLINE) injection 10 mg (has no administration in time range)  sodium chloride 0.9 % bolus 1,000 mL (0 mLs Intravenous Stopped 08/24/18 1037)  ondansetron (ZOFRAN) injection 4 mg (4 mg Intravenous Given 08/24/18 0735)  famotidine (PEPCID) IVPB 20 mg premix (0 mg Intravenous Stopped 08/24/18 0903)  metoCLOPramide (REGLAN) injection 10 mg (10 mg Intravenous Given 08/24/18 0925)  iohexol (OMNIPAQUE) 300 MG/ML solution 100 mL (100 mLs Intravenous Contrast Given 08/24/18 7408)    Mobility walks Low fall risk   Focused Assessments gastr   R Recommendations: See Admitting Provider Note  Report given to:   Additional Notes: 2 doses zofran and then pepsid with no releif of nausea.  reglan and she feel some better.  She wears an attends, but is able to urinate on bedpan.  I will put the ng tube in.

## 2018-08-24 NOTE — H&P (Signed)
[name] SURGICAL HISTORY & PHYSICAL (cpt [phone])  HISTORY OF PRESENT ILLNESS (HPI):  68 y.o. female presented to [facility] ED today for nausea/emesis. Patient reports that she woke up this morning around 2AM with multiple episodes of nausea/emesis. This was non-bloody/non-bilious. She did endorse generalized abdominal pain, but she felt this was related to straining with throwing up. No reports of fever, chills, cough, congestion, CP, SOB, bowel changes, or bladder changes. She denied any history of similar in the past. No reports of flatus and her last BM was yesterday morning. She does have a significant history of sigmoid colectomy with colostomy, subsequent colostomy takedown and diverting ileostomy, and ileostomy takedown with Dr Adonis Huguenin between 2018-2019. Work up in the ED was concerning for small bowel obstruction. Additionally, she was complaining of increased thirst and her BS are near 300. She reports receiving a steroid injection in her back a few days ago and her sugars have been expectedly high since then.   General surgery is consulted by emergency medicine physician Dr Rudene Re, MD for evaluation and management of small bowel obstruction.     PAST MEDICAL HISTORY (PMH):  Past Medical History:  Diagnosis Date  . Anginal pain ([place])   . 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 CHF (congestive heart failure) ([place])   . Diabetes ([place])   . Diverticulitis   . Hypertension   . PE (pulmonary thromboembolism) ([place])   . 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 Otherwise negative.   PAST SURGICAL HISTORY ([place]):  Past Surgical History:  Procedure Laterality Date  . APPENDECTOMY N/A 02/24/2017   Procedure: Incidental  APPENDECTOMY;  Surgeon: [name], MD;  Location: [name] ORS;  Service: General;  Laterality: N/A;  . CARPAL TUNNEL RELEASE Bilateral   . COLON RESECTION SIGMOID N/A 11/02/2016   Procedure: COLON  RESECTION SIGMOID;  Surgeon: [name], MD;  Location: [name] ORS;  Service: General;  Laterality: N/A;  . COLON SURGERY  11/02/2016  . COLONOSCOPY WITH PROPOFOL N/A 02/03/2017   Procedure: COLONOSCOPY WITH PROPOFOL;  Surgeon: Lin Landsman, MD;  Location: [facility] ENDOSCOPY;  Service: Gastroenterology;  Laterality: N/A;  . COLOSTOMY N/A 11/02/2016   Procedure: COLOSTOMY;  Surgeon: [name], MD;  Location: [name] ORS;  Service: General;  Laterality: N/A;  . COLOSTOMY REVERSAL N/A 02/24/2017   Procedure: COLOSTOMY REVERSAL, ostomy takedown, spleenic flexure mobilization, excision rectal stump/distal sigmoid, anastomosis with suture reinforcement;  Surgeon: [name], MD;  Location: [name] ORS;  Service: General;  Laterality: N/A;  . ILEO LOOP DIVERSION N/A 02/24/2017   Procedure: ILEO LOOP COLOSTOMY;  Surgeon: [name], MD;  Location: [name] ORS;  Service: General;  Laterality: N/A;  . ILEOSTOMY CLOSURE N/A 06/01/2017   Procedure: LOOP ILEOSTOMY TAKEDOWN;  Surgeon: [name], MD;  Location: [name] ORS;  Service: General;  Laterality: N/A;  . IVC FILTER INSERTION Right 10/2016  . IVC FILTER REMOVAL N/A 08/09/2017   Procedure: IVC FILTER REMOVAL;  Surgeon: Katha Cabal, MD;  Location: [place] CV LAB;  Service: Cardiovascular;  Laterality: N/A;  . KNEE SURGERY     torn menicus  . LAPAROSCOPY N/A 02/24/2017   Procedure: LAPAROSCOPY DIAGNOSTIC;  Surgeon: [name], MD;  Location: [name] ORS;  Service: General;  Laterality: N/A;  . LYSIS OF ADHESION N/A 02/24/2017   Procedure: LYSIS OF ADHESION;  Surgeon: [name], MD;  Location: [name] ORS;  Service: General;  Laterality: N/A;  . PULMONARY  VENOGRAPHY N/A 11/19/2016   Procedure: Pulmonary Venography; IVC filter placement; possible pulmonary thrombectomy;  Surgeon: Katha Cabal, MD;  Location: [place] CV LAB;  Service: Cardiovascular;  Laterality: N/A;  . SACROPLASTY N/A 11/08/2017   Procedure:  SACROPLASTY S2;  Surgeon: Hessie Knows, MD;  Location: [name] ORS;  Service: Orthopedics;  Laterality: N/A;  . SIGMOIDOSCOPY N/A 11/13/2016   Procedure: endoscopic  flexible SIGMOIDOSCOPY;  Surgeon: Florene Glen, MD;  Location: [name] ORS;  Service: General;  Laterality: N/A;  . SIGMOIDOSCOPY N/A 05/19/2017   Procedure: Beryle Quant;  Surgeon: [name], MD;  Location: [name] ORS;  Service: General;  Laterality: N/A;    Reviewed. Otherwise negative.   MEDICATIONS:  Prior to Admission medications   Medication Sig Start Date End Date Taking? Authorizing Provider  aspirin EC 81 MG tablet Take 81 mg by mouth daily.    [provider]  candesartan (ATACAND) 16 MG tablet Take 8 mg by mouth daily.  08/03/17   [provider]  cyclobenzaprine (FLEXERIL) 10 MG tablet Take 10 mg by mouth at bedtime.  07/05/16   [provider]  gabapentin (NEURONTIN) 100 MG capsule Take 200 mg by mouth 3 (three) times daily.     [provider]  glipiZIDE (GLUCOTROL XL) 5 MG 24 hr tablet Take 10 mg by mouth daily with breakfast.  10/27/16   [provider]  HYDROcodone-acetaminophen (NORCO/VICODIN) 5-325 MG tablet Take 1 tablet by mouth 2 (two) times daily as needed for up to 30 days for severe pain. Must last 30 days. Patient not taking: Reported on 07/24/2018 06/08/18 07/08/18  Milinda Pointer, MD  meclizine (ANTIVERT) 25 MG tablet  12/29/17   [provider]  Multiple Vitamin (MULTI-VITAMINS) TABS Take 1 tablet by mouth daily.    [provider]  rOPINIRole (REQUIP) 0.5 MG tablet Take 0.5 mg by mouth at bedtime.    [provider]  warfarin (COUMADIN) 6 MG tablet Take 6 mg by mouth daily. During the week and 7 mg on weekends    [provider]     ALLERGIES:  Allergies  Allergen Reactions  . Adhesive [Tape] Other (See Comments)    Pt reports, when removing tape from skin most tapes pull her skin off. Ok to use paper tape   . Other Hives and Other (See Comments)    Chlorhexadine wipes/CHG wipes, Pt reports, when removing tape from skin most tapes pull her skin off. Ok to use paper tape  . Latex Rash  . Morphine And Related Nausea And Vomiting     SOCIAL HISTORY:  Social History   Socioeconomic History  . Marital status: Married    Spouse name: Not on file  . Number of children: Not on file  . Years of education: Not on file  . Highest education level: Not on file  Occupational History  . Not on file  Social Needs  . Financial resource strain: Not on file  . Food insecurity:    Worry: Not on file    Inability: Not on file  . Transportation needs:    Medical: Not on file    Non-medical: Not on file  Tobacco Use  . Smoking status: Never Smoker  . Smokeless tobacco: Never Used  Substance and Sexual Activity  . Alcohol use: No    Alcohol/week: 0.0 standard drinks  . Drug use: No  . Sexual activity: Never  Lifestyle  . Physical activity:    Days per week: Not on file  Minutes per session: Not on file  . Stress: Not on file  Relationships  . Social connections:    Talks on phone: Not on file    Gets together: Not on file    Attends religious service: Not on file    Active member of club or organization: Not on file    Attends meetings of clubs or organizations: Not on file    Relationship status: Not on file  . Intimate partner violence:    Fear of current or ex partner: Not on file    Emotionally abused: Not on file    Physically abused: Not on file    Forced sexual activity: Not on file  Other Topics Concern  . Not on file  Social History Narrative  . Not on file     FAMILY HISTORY:  Family History  Problem Relation Age of Onset  . Diabetes Mother   . Heart disease Mother   . Cancer Mother   . Breast cancer Mother   . Heart disease Father   . Diabetes Sister   . Heart disease Sister   . Breast cancer Cousin     Otherwise negative.   REVIEW OF SYSTEMS:  Review of  Systems  Constitutional: Negative for chills and fever.  HENT: Negative for congestion and sinus pain.   Respiratory: Negative for cough and shortness of breath.   Cardiovascular: Negative for chest pain and palpitations.  Gastrointestinal: Positive for abdominal pain (Generalized), nausea and vomiting. Negative for constipation and diarrhea.  Genitourinary: Negative for dysuria and urgency.  Musculoskeletal: Negative for myalgias.  Neurological: Negative for dizziness and headaches.  All other systems reviewed and are negative.   VITAL SIGNS:  Temp:  [98.2 F (36.8 C)] 98.2 F (36.8 C) (04/02 0705) Pulse Rate:  [91-99] 93 (04/02 1114) Resp:  [16-20] 16 (04/02 1114) BP: (141-167)/(62-94) 160/94 (04/02 1114) SpO2:  [94 %-97 %] 97 % (04/02 1114) Weight:  [102.1 kg] 102.1 kg (04/02 0706)     Height: 5\' 3"  (160 cm) Weight: 102.1 kg BMI (Calculated): 39.87   PHYSICAL EXAM:  Physical Exam Vitals signs and nursing note reviewed.  Constitutional:      General: She is not in acute distress.    Appearance: She is well-developed. She is obese. She is not ill-appearing.  HENT:     Head: Normocephalic and atraumatic.  Eyes:     General: No scleral icterus.    Extraocular Movements: Extraocular movements intact.  Cardiovascular:     Rate and Rhythm: Normal rate and regular rhythm.     Heart sounds: Normal heart sounds. No murmur.  Pulmonary:     Effort: Pulmonary effort is normal. No respiratory distress.     Breath sounds: Normal breath sounds. No wheezing or rhonchi.  Abdominal:     General: A surgical scar is present.     Palpations: Abdomen is soft.     Tenderness: There is no abdominal tenderness. There is no guarding or rebound.     Hernia: A hernia is present. Hernia is present in the ventral area (Difficult to appreciate given morbid obesity).     Comments: Previous midline laparotomy, colostomy, and diverting ileostomy scars. These are all well healed  Genitourinary:     Comments: Deferred Skin:    General: Skin is warm and dry.     Coloration: Skin is not jaundiced or pale.  Neurological:     General: No focal deficit present.     Mental Status: She is alert  and oriented to person, place, and time.  Psychiatric:        Mood and Affect: Mood normal.        Behavior: Behavior normal.     INTAKE/OUTPUT:  This shift: Total I/O In: 1000 [IV Piggyback:1000] Out: -   Last 2 shifts: @IOLAST2SHIFTS @  Labs:  CBC Latest Ref Rng & Units 08/24/2018 10/03/2017 06/03/2017  WBC 4.0 - 10.5 K/uL 9.5 9.9 9.3  Hemoglobin 12.0 - 15.0 g/dL 13.5 16.5(H) 12.7  Hematocrit 36.0 - 46.0 % 41.9 49.7(H) 37.8  Platelets 150 - 400 K/uL 269 200 186   CMP Latest Ref Rng & Units 08/24/2018 10/03/2017 06/03/2017  Glucose 70 - 99 mg/dL 295(H) 218(H) 141(H)  BUN 8 - 23 mg/dL 16 19 11   Creatinine 0.44 - 1.00 mg/dL 0.71 0.63 0.62  Sodium 135 - 145 mmol/L 139 134(L) 140  Potassium 3.5 - 5.1 mmol/L 3.9 4.0 3.7  Chloride 98 - 111 mmol/L 103 104 108  CO2 22 - 32 mmol/L 26 22 22   Calcium 8.9 - 10.3 mg/dL 9.3 8.3(L) 8.1(L)  Total Protein 6.5 - 8.1 g/dL 7.7 - -  Total Bilirubin 0.3 - 1.2 mg/dL 0.9 - -  Alkaline Phos 38 - 126 U/L 60 - -  AST 15 - 41 U/L 44(H) - -  ALT 0 - 44 U/L 53(H) - -     Imaging studies:   [place] RUQ (08/24/2018) personally reviewed and radiologist report reviewed:  IMPRESSION: 1. Cholelithiasis. Although the patient was subjectively tender over the gallbladder during the examination, there are no other signs by ultrasound of acute cholecystitis. No biliary dilatation identified. 2. Diffuse hepatic steatosis.  KUB (08/24/2018) personally reviewed and radiologist report reviewed:  IMPRESSION: 1) No evidence of bowel obstruction or perforation. Few gastric loops of small bowel in the left lower abdomen that are not dilated but may imply component of regional ileus or enteritis.  CT Abdomen/Pelvis (08/24/2018) personally reviewed and radiologist report reviewed:   IMPRESSION: 1. Mild dilatation of proximal small bowel without obstructing lesion identified. Findings could represent focal ileus versus mild partial obstruction. 2. distal small bowel is relatively collapsed however there is no transition point identified. 3. Enteric enteric anastomosis in RIGHT lower quadrant without obstruction. 4. Appendix not identified.  No secondary signs appendicitis. 5. Cholelithiasis without evidence of cholecystitis. 6. 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   Assessment/Plan: (ICD-10's: K83.51) 68 y.o. 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  - NPO for now, IV fluids             - insert NG tube for nasogastric decompression             - monitor ongoing bowel function and abdominal exam              - anticipate symptomatic relief within 24 - 48 hours following NGT insertion, followed by "rumbling" the following day and flatus either the same day or the day following the "rumbling" with anticipated length of stay ~3 - 5 days with successful non-operative management for 8 of 10 patients with small bowel obstruction attributed to post-surgical adhesions  - surgical intervention if doesn't  improve was also discussed             - medical management comorbidities; monitor blood sugar             - ambulation encouraged              - DVT prophylaxis   All of the above findings and recommendations were discussed with the patient, and all of her questions were answered to her expressed  satisfaction.  -- Edison Simon, PA-C [facility] 08/24/2018, 11:23 AM [phone] M-F: 7am - 4pm

## 2018-08-24 NOTE — ED Triage Notes (Signed)
Pt arrived via [facility] from home with c/o N/V since 2 am this morning. EMS states that pt woke up to go to the bathroom and then started vomiting. EMS states pt vomited x6-7 times. EMS gave 4 mg zofran en route.

## 2018-08-24 NOTE — ED Provider Notes (Signed)
[facility] Emergency Department Provider Note  ____________________________________________  Time seen: Approximately 7:30 AM  I have reviewed the triage vital signs and the nursing notes.   HISTORY  Chief Complaint Emesis; Nausea; and Abdominal Pain   HPI Kara [name] is a 68 y.o. female with a history of partial colectomy, appendectomy, cholelithiasis, DVT/PE on Coumadin, chronic pain, diabetes, hypertension who presents for evaluation of nausea and vomiting.  Patient reports that she woke up at 2 AM this morning with nausea and vomiting.  Has had several episodes of nonbloody nonbilious emesis.  No diarrhea, no constipation, no abdominal distention.  She is passing flatus.  She is complaining of pressure in the left upper quadrant and epigastric region.  She denies dysuria or hematuria, cough or shortness of breath, chest pain or fever.  Past Medical History:  Diagnosis Date   Anginal pain ([place])    Arthritis    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, 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   CHF (congestive heart failure) ([place])    Diabetes ([place])    Diverticulitis    Hypertension    PE (pulmonary thromboembolism) ([place])    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[facility])     Patient Active Problem List   Diagnosis Date Noted   Small bowel obstruction due to adhesions ([place]) 08/24/2018   Coccygodynia 05/09/2018   Acute deep vein thrombosis (DVT) of distal end of right lower extremity ([place]) 05/08/2018   DDD (degenerative disc disease), lumbosacral 04/11/2018   Chronic anticoagulation (COUMADIN) 04/11/2018   Morbid obesity with BMI of 45.0-49.9, adult ([place]) 03/15/2018   Secondary osteoarthritis of multiple sites 03/15/2018   Sacral insufficiency fracture 03/15/2018   Chronic low back pain (Primary Area of Pain) (Bilateral) 12/14/2017   Spondylosis without myelopathy or radiculopathy, lumbosacral region 12/14/2017   Other specified dorsopathies, sacral  and sacrococcygeal region 12/14/2017   History of allergy to latex 11/09/2017   Hyponatremia 11/02/2017   Hypocalcemia 11/02/2017   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-S1 anterolisthesis. 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) 11/02/2017   Lumbar pars defect (L3 and L4) (Bilateral) 11/02/2017   Diabetic peripheral neuropathy ([place]) 11/02/2017   Chronic lower extremity pain (Secondary Area of Pain) (Bilateral) 11/02/2017   Chronic radicular pain of lower extremity 11/02/2017   Osteoarthritis of hip (Right) 11/02/2017   Chronic low back pain (Primary Area of Pain) (Bilateral) w/ sciatica (Bilateral) 10/13/2017   Chronic sacroiliac joint pain (Right) 10/13/2017   Chronic pain syndrome 10/13/2017   Long term current use of opiate analgesic 10/13/2017   Pharmacologic therapy 10/13/2017   Disorder of skeletal system 10/13/2017   Problems influencing health status 10/13/2017   Diverticulosis of colon 06/20/2017   History of pulmonary embolism 06/20/2017   S/P IVC filter 06/20/2017   H/O ileostomy 06/01/2017   Positive result for methicillin resistant Staphylococcus aureus (MRSA) screening 05/11/2017   Ileostomy in place [facility]) 04/08/2017   History of colostomy reversal 02/24/2017   Other pulmonary embolism without acute cor pulmonale ([place]) 11/23/2016   Acute on chronic systolic (congestive) heart failure ([place]) 11/18/2016   HCAP (healthcare-associated pneumonia) 11/18/2016   Encounter for removal of staples    Edema of both legs 11/15/2016   Diverticulitis 10/03/2016   Diverticulitis large intestine  09/29/2016   Benign essential hypertension 08/26/2016   DDD  (degenerative disc disease), lumbar 07/14/2016   Acute non-recurrent maxillary sinusitis 05/21/2016   Chronic hip pain (Left) 02/23/2016   Cough 07/30/2015   Nocturnal leg cramps 02/20/2015   Diabetes mellitus type 2 in obese ([place]) 11/12/2014   Type 2 diabetes mellitus without complication, without long-term current use of insulin ([place]) 11/12/2014   BP (high blood pressure) 11/12/2014    Past Surgical History:  Procedure Laterality Date   APPENDECTOMY N/A 02/24/2017   Procedure: Incidental  APPENDECTOMY;  Surgeon: [name], MD;  Location: [name] ORS;  Service: General;  Laterality: N/A;   CARPAL TUNNEL RELEASE Bilateral    COLON RESECTION SIGMOID N/A 11/02/2016   Procedure: COLON RESECTION SIGMOID;  Surgeon: [name], MD;  Location: [name] ORS;  Service: General;  Laterality: N/A;   COLON SURGERY  11/02/2016   COLONOSCOPY WITH PROPOFOL N/A 02/03/2017   Procedure: COLONOSCOPY WITH PROPOFOL;  Surgeon: Lin Landsman, MD;  Location: [name] ENDOSCOPY;  Service: Gastroenterology;  Laterality: N/A;   COLOSTOMY N/A 11/02/2016   Procedure: COLOSTOMY;  Surgeon: [name], MD;  Location: [name] ORS;  Service: General;  Laterality: N/A;   COLOSTOMY REVERSAL N/A 02/24/2017   Procedure: COLOSTOMY REVERSAL, ostomy takedown, spleenic flexure mobilization, excision rectal stump/distal sigmoid, anastomosis with suture reinforcement;  Surgeon: [name], MD;  Location: [name] ORS;  Service: General;  Laterality: N/A;   ILEO LOOP DIVERSION N/A 02/24/2017   Procedure: ILEO LOOP COLOSTOMY;  Surgeon: [name], MD;  Location: [name] ORS;  Service: General;  Laterality: N/A;   ILEOSTOMY CLOSURE N/A 06/01/2017   Procedure: LOOP ILEOSTOMY TAKEDOWN;  Surgeon: [name], MD;  Location: [name] ORS;  Service: General;  Laterality: N/A;   IVC FILTER INSERTION Right 10/2016   IVC FILTER REMOVAL N/A 08/09/2017   Procedure: IVC FILTER REMOVAL;  Surgeon: Katha Cabal,  MD;  Location: [place] [facility] LAB;  Service: Cardiovascular;  Laterality: N/A;   KNEE SURGERY     torn menicus   LAPAROSCOPY N/A 02/24/2017   Procedure: LAPAROSCOPY DIAGNOSTIC;  Surgeon: [name], MD;  Location: [name] OR[facility];  Service: General;  Laterality: N/A;   LYSIS OF ADHESION N/A 02/24/2017   Procedure: LYSIS OF ADHESION;  Surgeon: [name], MD;  Location: [name] ORS;  Service: General;  Laterality: N/A;   PULMONARY VENOGRAPHY N/A 11/19/2016   Procedure: Pulmonary Venography; IVC filter placement; possible pulmonary thrombectomy;  Surgeon: Katha Cabal, MD;  Location: [place] CV LAB;  Service: Cardiovascular;  Laterality: N/A;   SACROPLASTY N/A 11/08/2017   Procedure: SACROPLASTY S2;  Surgeon: Hessie Knows, MD;  Location: [name] OR[facility];  Service: Orthopedics;  Laterality: N/A;   SIGMOIDOSCOPY N/A 11/13/2016   Procedure: endoscopic  flexible SIGMOIDOSCOPY;  Surgeon: Florene Glen, MD;  Location: [name] OR[facility];  Service: General;  Laterality: N/A;   SIGMOIDOSCOPY N/A 05/19/2017   Procedure: Beryle Quant;  Surgeon: [name], MD;  Location: [name] ORS;  Service: General;  Laterality: N/A;    Prior to Admission medications   Medication Sig Start Date End Date Taking? Authorizing Provider  aspirin EC 81 MG tablet Take 81 mg by mouth daily.    [provider]  candesartan (ATACAND) 16 MG tablet Take 8 mg by mouth daily.  08/03/17   [provider]  cyclobenzaprine (FLEXERIL) 10 MG tablet Take 10 mg by mouth at bedtime.  07/05/16   [provider]  gabapentin (NEURONTIN) 100 MG capsule Take 200 mg by mouth 3 (three)  times daily.     [provider]  glipiZIDE (GLUCOTROL XL) 5 MG 24 hr tablet Take 10 mg by mouth daily with breakfast.  10/27/16   [provider]  HYDROcodone-acetaminophen (NORCO/VICODIN) 5-325 MG tablet Take 1 tablet by mouth 2 (two) times daily as needed for up to 30 days for severe pain. Must last 30  days. Patient not taking: Reported on 07/24/2018 06/08/18 07/08/18  Milinda Pointer, MD  meclizine (ANTIVERT) 25 MG tablet  12/29/17   [provider]  Multiple Vitamin (MULTI-VITAMINS) TABS Take 1 tablet by mouth daily.    [provider]  rOPINIRole (REQUIP) 0.5 MG tablet Take 0.5 mg by mouth at bedtime.    [provider]  warfarin (COUMADIN) 6 MG tablet Take 6 mg by mouth daily. During the week and 7 mg on weekends    [provider]    Allergies Adhesive [tape]; Other; Latex; and Morphine and related  Family History  Problem Relation Age of Onset   Diabetes Mother    Heart disease Mother    Cancer Mother    Breast cancer Mother    Heart disease Father    Diabetes Sister    Heart disease Sister    Breast cancer Cousin     Social History Social History   Tobacco Use   Smoking status: Never Smoker   Smokeless tobacco: Never Used  Substance Use Topics   Alcohol use: No    Alcohol/week: 0.0 standard drinks   Drug use: No    Review of Systems  Constitutional: Negative for fever. Eyes: Negative for visual changes. ENT: Negative for sore throat. Neck: No neck pain  Cardiovascular: Negative for chest pain. Respiratory: Negative for shortness of breath. Gastrointestinal: + upper abdominal pain, nausea, and vomiting. No diarrhea. Genitourinary: Negative for dysuria. Musculoskeletal: Negative for back pain. Skin: Negative for rash. Neurological: Negative for headaches, weakness or numbness. Psych: No SI or HI  ____________________________________________   PHYSICAL EXAM:  VITAL SIGNS: ED Triage Vitals  Enc Vitals Group     BP 08/24/18 0705 (!) 141/62     Pulse Rate 08/24/18 0705 91     Resp 08/24/18 0705 20     Temp 08/24/18 0705 98.2 F (36.8 C)     Temp Source 08/24/18 0705 Oral     SpO2 08/24/18 0705 94 %     Weight 08/24/18 0706 225 lb (102.1 kg)     Height 08/24/18 0706 5\' 3"  (1.6 m)     Head Circumference  --      Peak Flow --      Pain Score 08/24/18 0705 5     Pain Loc --      Pain Edu? --      Excl. in [place]? --     Constitutional: Alert and oriented. Well appearing and in no apparent distress. HEENT:      Head: Normocephalic and atraumatic.         Eyes: Conjunctivae are normal. Sclera is non-icteric.       Mouth/Throat: Mucous membranes are moist.       Neck: Supple with no signs of meningismus. Cardiovascular: Regular rate and rhythm. No murmurs, gallops, or rubs. 2+ symmetrical distal pulses are present in all extremities. No JVD. Respiratory: Normal respiratory effort. Lungs are clear to auscultation bilaterally. No wheezes, crackles, or rhonchi.  Gastrointestinal: Obese, mild LUQ tenderness to palpation, and non distended with positive bowel sounds. No rebound or guarding. Genitourinary: No CVA tenderness. Musculoskeletal: Nontender  with normal range of motion in all extremities. No edema, cyanosis, or erythema of extremities. Neurologic: Normal speech and language. Face is symmetric. Moving all extremities. No gross focal neurologic deficits are appreciated. Skin: Skin is warm, dry and intact. No rash noted. Psychiatric: Mood and affect are normal. Speech and behavior are normal.  ____________________________________________   LABS (all labs ordered are listed, but only abnormal results are displayed)  Labs Reviewed  COMPREHENSIVE METABOLIC PANEL - Abnormal; Notable for the following components:      Result Value   Glucose, Bld 295 (*)    AST 44 (*)    ALT 53 (*)    All other components within normal limits  URINALYSIS, COMPLETE (UACMP) WITH MICROSCOPIC - Abnormal; Notable for the following components:   Color, Urine YELLOW (*)    APPearance CLOUDY (*)    Glucose, UA >=500 (*)    Hgb urine dipstick SMALL (*)    Ketones, ur 5 (*)    Leukocytes,Ua LARGE (*)    Bacteria, UA RARE (*)    Non Squamous Epithelial PRESENT (*)    All other components within normal limits  CBC  WITH DIFFERENTIAL/PLATELET  LIPASE, BLOOD  TROPONIN I  PROTIME-INR  HIV ANTIBODY (ROUTINE TESTING W REFLEX)   ____________________________________________  EKG  ED ECG REPORT I, Rudene Re, the attending physician, personally viewed and interpreted this ECG.  Normal sinus rhythm, rate of 93, normal intervals, normal axis, no ST elevations or depressions. Normal EKG ____________________________________________  RADIOLOGY  I have personally reviewed the images performed during this visit and I agree with the Radiologist's read.   Interpretation by Radiologist:  Ct Abdomen Pelvis W Contrast  Result Date: 08/24/2018 CLINICAL DATA:  Pt arrived via [facility] from home with c/o N/V since 2 am this morning. EMS states that pt woke up to go to the bathroom and then started vomiting. EMS states pt vomited x6-7 times. Hx of colon resection surgery for diverticulitis.*comment was truncated*^114mL OMNIPAQUE IOHEXOL 300 MG/ML SOLNAbd pain, unspecified EXAM: CT ABDOMEN AND PELVIS WITH CONTRAST TECHNIQUE: Multidetector CT imaging of the abdomen and pelvis was performed using the standard protocol following bolus administration of intravenous contrast. CONTRAST:  132mL OMNIPAQUE IOHEXOL 300 MG/ML  SOLN COMPARISON:  Abdominal radiograph 08/24/2018 FINDINGS: Lower chest: Lung bases are clear. Hepatobiliary: No focal hepatic lesion. Several small gallstones in the gallbladder. No gallbladder distension. Pancreas: Pancreas is normal. No ductal dilatation. No pancreatic inflammation. Spleen: Normal spleen Adrenals/urinary tract: Adrenal glands and kidneys are normal. The ureters and bladder normal. Moderate bladder cystocele noted on the sagittal projections (image 62/6). Stomach/Bowel: Stomach and duodenum are normal. Proximal small bowel is mildly dilated 3.8 cm (image 40/5). The this quickly changes to normal caliber bowel. The most distal small bowel is decompressed. There is no focal transition point.  Enteric enteric anastomosis in the RIGHT lower quadrant without obstruction. The ascending colon and transverse colon contains stool. Descending colon is collapsed. Rectosigmoid colon normal. There is a anastomosis in the sigmoid colon without complication. The appendix is not identified.  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 There is no retroperitoneal or periportal lymphadenopathy. No pelvic lymphadenopathy. Reproductive: Uterus and ovaries normal for age. Other: No free fluid. Musculoskeletal: No aggressive osseous lesion. IMPRESSION: 1. Mild dilatation of proximal small bowel without obstructing lesion identified. Findings could represent focal ileus versus mild partial obstruction. 2. distal small bowel is  relatively collapsed however there is no transition point identified. 3. Enteric enteric anastomosis in RIGHT lower quadrant without obstruction. 4. Appendix not identified.  No secondary signs appendicitis. 5. Cholelithiasis without evidence of cholecystitis. 6. 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 Electronically Signed   By: Suzy Bouchard M.D.   On: 08/24/2018 10:16   Dg Abd 2 Views  Result Date: 08/24/2018 CLINICAL DATA:  Nausea and vomiting. EXAM: ABDOMEN - 2 VIEW COMPARISON:  CT of the abdomen and pelvis on 10/03/2017 FINDINGS: No evidence bowel obstruction or significant bowel dilatation. There are a few gas-filled loops of small bowel in the left lower abdomen that are not dilated but may imply some component of regional ileus or enteritis. No free intraperitoneal air, abnormal calcifications or soft tissue abnormalities. Evidence of prior bilateral sacroplasty. IMPRESSION: No evidence of bowel obstruction or perforation. Few gastric loops of small bowel in the left lower abdomen  that are not dilated but may imply component of regional ileus or enteritis. Electronically Signed   By: Aletta Edouard M.D.   On: 08/24/2018 08:35   US Abdomen Limited Ruq  Result Date: 08/24/2018 CLINICAL DATA:  Upper abdominal pain and vomiting. History of cholelithiasis. EXAM: ULTRASOUND ABDOMEN LIMITED RIGHT UPPER QUADRANT COMPARISON:  CT of the abdomen on 10/03/2017. FINDINGS: Gallbladder: Several shadowing gallstones in the gallbladder lumen which appear mole bile. No evidence gallbladder wall thickening, gallbladder dilatation or pericholecystic fluid. The patient was subjectively tender over the gallbladder during the exam. Common bile duct: Diameter: 4 mm Liver: The liver demonstrates coarse echotexture and increased echogenicity, likely reflecting diffuse steatosis. No overt cirrhotic contour abnormalities or focal lesions are identified. There is no evidence of intrahepatic biliary ductal dilatation. Portal vein is patent on color Doppler imaging with normal direction of blood flow towards the liver. IMPRESSION: 1. Cholelithiasis. Although the patient was subjectively tender over the gallbladder during the examination, there are no other signs by ultrasound of acute cholecystitis. No biliary dilatation identified. 2. Diffuse hepatic steatosis. Electronically Signed   By: Aletta Edouard M.D.   On: 08/24/2018 08:13      ____________________________________________   PROCEDURES  Procedure(s) performed: None Procedures Critical Care performed:  None ____________________________________________   INITIAL IMPRESSION / ASSESSMENT AND PLAN / ED COURSE  68 y.o. female with a history of partial colectomy, appendectomy, cholelithiasis, DVT/PE on Coumadin, chronic pain, diabetes, hypertension who presents for evaluation of nausea and vomiting since 2 AM.  Patient is well-appearing and in no distress, normal vital signs, abdomen is obese, soft to with mild left upper quadrant tenderness.   Differential diagnoses including gastritis versus peptic ulcer disease versus pancreatitis versus gastroenteritis versus SBO versus gallbladder disease.  Will give Zofran and fluids.  Will check labs including CBC, CMP, lipase, urinalysis, INR.  We will send patient for right upper quadrant ultrasound and KUB.  EKG with no ischemic changes.  Troponin is pending.  Clinical Course as of Aug 23 1037  Thu Aug 24, 2018  1038 CT concerning for small bowel obstruction.  Discussed with Dr. Rosana Hoes from surgery who recommended NG tube placement admission for monitoring.  Patient is in agreement with the plan.   [CV]    Clinical Course User Index [CV] Alfred Levins [place], MD     As part of my medical decision making, I reviewed the following data within the [place] notes reviewed and incorporated, Labs reviewed , EKG interpreted , Old EKG reviewed, Old chart reviewed,  Radiograph reviewed , A consult was requested and obtained from this/these consultant(s) Surgery, Notes from prior ED visits and [place] Controlled Substance Database    Pertinent labs & imaging results that were available during my care of the patient were reviewed by me and considered in my medical decision making (see chart for details).    ____________________________________________   FINAL CLINICAL IMPRESSION(S) / ED DIAGNOSES  Final diagnoses:  Nausea and vomiting  SBO (small bowel obstruction) ([place])      NEW MEDICATIONS STARTED DURING THIS VISIT:  ED Discharge Orders    None       Note:  This document was prepared using Dragon voice recognition software and may include unintentional dictation errors.    Alfred Levins, [place], MD 08/24/18 [phone]

## 2018-08-25 DIAGNOSIS — K565 Intestinal adhesions [bands], unspecified as to partial versus complete obstruction: Secondary | ICD-10-CM

## 2018-08-25 LAB — GLUCOSE, CAPILLARY
Glucose-Capillary: 126 mg/dL — ABNORMAL HIGH (ref 70–99)
Glucose-Capillary: 144 mg/dL — ABNORMAL HIGH (ref 70–99)
Glucose-Capillary: 150 mg/dL — ABNORMAL HIGH (ref 70–99)
Glucose-Capillary: 151 mg/dL — ABNORMAL HIGH (ref 70–99)
Glucose-Capillary: 198 mg/dL — ABNORMAL HIGH (ref 70–99)
Glucose-Capillary: 210 mg/dL — ABNORMAL HIGH (ref 70–99)

## 2018-08-25 LAB — BASIC METABOLIC PANEL
Anion gap: 8 (ref 5–15)
BUN: 16 mg/dL (ref 8–23)
CO2: 24 mmol/L (ref 22–32)
Calcium: 8.5 mg/dL — ABNORMAL LOW (ref 8.9–10.3)
Chloride: 108 mmol/L (ref 98–111)
Creatinine, Ser: 0.71 mg/dL (ref 0.44–1.00)
GFR calc Af Amer: >60 mL/min (ref >60)
GFR calc non Af Amer: >60 mL/min (ref >60)
Glucose, Bld: 148 mg/dL — ABNORMAL HIGH (ref 70–99)
Potassium: 3.9 mmol/L (ref 3.5–5.1)
Sodium: 140 mmol/L (ref 135–145)

## 2018-08-25 LAB — CBC
HCT: 39 % (ref 36.0–46.0)
Hemoglobin: 12.3 g/dL (ref 12.0–15.0)
MCH: 26.9 pg (ref 26.0–34.0)
MCHC: 31.5 g/dL (ref 30.0–36.0)
MCV: 85.2 fL (ref 80.0–100.0)
Platelets: 250 10*3/uL (ref 150–400)
RBC: 4.58 MIL/uL (ref 3.87–5.11)
RDW: 15.7 % — ABNORMAL HIGH (ref 11.5–15.5)
WBC: 7.6 10*3/uL (ref 4.0–10.5)
nRBC: 0 % (ref 0.0–0.2)

## 2018-08-25 MED ORDER — BUTALBITAL-APAP-CAFFEINE 50-325-40 MG PO TABS
2.0000 | ORAL_TABLET | Freq: Once | ORAL | Status: AC
  Administered 2018-08-25: 11:00:00 2 via ORAL
  Filled 2018-08-25: qty 2

## 2018-08-25 MED ORDER — ACETAMINOPHEN 10 MG/ML IV SOLN
1000.0000 mg | Freq: Once | INTRAVENOUS | Status: AC
  Administered 2018-08-25: 02:00:00 1000 mg via INTRAVENOUS
  Filled 2018-08-25: qty 100

## 2018-08-25 MED ORDER — INSULIN ASPART 100 UNIT/ML ~~LOC~~ SOLN
0.0000 [IU] | Freq: Three times a day (TID) | SUBCUTANEOUS | Status: DC
  Administered 2018-08-25: 22:00:00 5 [IU] via SUBCUTANEOUS
  Administered 2018-08-26: 3 [IU] via SUBCUTANEOUS
  Filled 2018-08-25 (×2): qty 1

## 2018-08-25 NOTE — Progress Notes (Signed)
Tolerating full liquid diet. NG tube was removed today.

## 2018-08-25 NOTE — Progress Notes (Signed)
[facility] SURGICAL PROGRESS NOTE (cpt [phone])  Hospital Day(s): 1.   Post op day(s):  Marland Kitchen   Interval History: Patient seen and examined, no acute events or new complaints overnight. Patient reports that she is feeling much better this morning. She denied any abdominal pain, nausea, or emesis. No reports of fever, chills, cough, congestion, CP, or SOB. She does endorse passing flatus last night and continues to do so this morning. NGT with about 200 ccs out since placement yesterday  Review of Systems:  Constitutional: denies fever, chills  HEENT: denies cough or congestion  Respiratory: denies any shortness of breath  Cardiovascular: denies chest pain or palpitations  Gastrointestinal: denies abdominal pain, N/V, or diarrhea/and bowel function as per interval history Genitourinary: denies burning with urination or urinary frequency   Vital signs in last 24 hours: [min-max] current  Temp:  [97.6 F (36.4 C)-98.7 F (37.1 C)] 98.1 F (36.7 C) (04/03 0820) Pulse Rate:  [74-99] 82 (04/03 0820) Resp:  [16-20] 18 (04/03 0820) BP: (123-167)/(65-94) 151/83 (04/03 0820) SpO2:  [92 %-98 %] 92 % (04/03 0820)     Height: 5\' 3"  (160 cm) Weight: 102.1 kg BMI (Calculated): 39.87   Intake/Output this shift:  No intake/output data recorded.   Intake/Output last 2 shifts:  @IOLAST2SHIFTS @   Physical Exam:  Constitutional: alert, cooperative and no distress  HENT: normocephalic without obvious abnormality, NGT in place  Eyes: PERRL, EOM's grossly intact and symmetric  Respiratory: breathing non-labored at rest  Cardiovascular: regular rate and sinus rhythm  Gastrointestinal: soft, non-tender, and non-distended Musculoskeletal: no edema or wounds, motor and sensation grossly intact, NT    Labs:  CBC Latest Ref Rng & Units 08/25/2018 08/24/2018 10/03/2017  WBC 4.0 - 10.5 K/uL 7.6 9.5 9.9  Hemoglobin 12.0 - 15.0 g/dL 12.3 13.5 16.5(H)  Hematocrit 36.0 - 46.0 % 39.0 41.9 49.7(H)   Platelets 150 - 400 K/uL 250 269 200   CMP Latest Ref Rng & Units 08/25/2018 08/24/2018 10/03/2017  Glucose 70 - 99 mg/dL 148(H) 295(H) 218(H)  BUN 8 - 23 mg/dL 16 16 19   Creatinine 0.44 - 1.00 mg/dL 0.71 0.71 0.63  Sodium 135 - 145 mmol/L 140 139 134(L)  Potassium 3.5 - 5.1 mmol/L 3.9 3.9 4.0  Chloride 98 - 111 mmol/L 108 103 104  CO2 22 - 32 mmol/L 24 26 22   Calcium 8.9 - 10.3 mg/dL 8.5(L) 9.3 8.3(L)  Total Protein 6.5 - 8.1 g/dL - 7.7 -  Total Bilirubin 0.3 - 1.2 mg/dL - 0.9 -  Alkaline Phos 38 - 126 U/L - 60 -  AST 15 - 41 U/L - 44(H) -  ALT 0 - 44 U/L - 53(H) -     Imaging studies: No new pertinent imaging studies   Assessment/Plan: (ICD-10's: K83.51) 68 y.o. female with improved abdominal pain and signs of bowel function return following the placement of NGT for small bowel obstruction likely attributable to post-surgical adhesions following Hartman's for diverticulitis (10/2016 - [place]), colostomy takedown and diverting ileostomy (02/2017) and ileostomy takedown (31/5400), complicated by pertinent comorbidities including CHF, DM, HTN, history of DVT/PE on coumadin with INR of 1.0, and morbid obesity.   - Will clamp NGT, if residuals <150ccs at 1300 will remove NGT and start clear liquids   - pain control prn; antiemetics prn  - Monitor abdominal examination; on-going bowel function   - medical management comorbidities; monitor blood sugar - ambulation encouraged - DVT prophylaxis     - Discharge planning: if NGT  out today, [facility] as tolerates over the weekend and hopefully home in next 24-48 hours   All of the above findings and recommendations were discussed with the patient, and the medical team, and all of patient's questions were answered to her expressed satisfaction.   -- Edison Simon, PA-C [facility] 08/25/2018, 9:48 AM [phone] M-F: 7am - 4pm

## 2018-08-25 NOTE — Progress Notes (Signed)
MD notified: the patient wanted me no ask you if her diet could be advanced. She has tolerated the clear liquid diet well.

## 2018-08-26 LAB — BASIC METABOLIC PANEL
Anion gap: 6 (ref 5–15)
BUN: 11 mg/dL (ref 8–23)
CO2: 24 mmol/L (ref 22–32)
Calcium: 8.6 mg/dL — ABNORMAL LOW (ref 8.9–10.3)
Chloride: 109 mmol/L (ref 98–111)
Creatinine, Ser: 0.73 mg/dL (ref 0.44–1.00)
GFR calc Af Amer: >60 mL/min (ref >60)
GFR calc non Af Amer: >60 mL/min (ref >60)
Glucose, Bld: 186 mg/dL — ABNORMAL HIGH (ref 70–99)
Potassium: 3.8 mmol/L (ref 3.5–5.1)
Sodium: 139 mmol/L (ref 135–145)

## 2018-08-26 LAB — GLUCOSE, CAPILLARY: Glucose-Capillary: 183 mg/dL — ABNORMAL HIGH (ref 70–99)

## 2018-08-26 LAB — HIV ANTIBODY (ROUTINE TESTING W REFLEX): HIV Screen 4th Generation wRfx: NONREACTIVE

## 2018-08-26 NOTE — Discharge Instructions (Signed)
°  Diet: Resume home heart healthy regular diet.   Activity: Increase activity as tolerated. Light activity and walking are encouraged. Do not drive or drink alcohol if taking narcotic pain medications.  Medications: Resume all home medications. For mild to moderate pain: acetaminophen (Tylenol) or ibuprofen (if no kidney disease). Combining Tylenol with alcohol can substantially increase your risk of causing liver disease.

## 2018-08-26 NOTE — Progress Notes (Signed)
Discharge instructions reviewed with the patient. Patient sent out via wheelchair to her waiting ride

## 2018-08-26 NOTE — Discharge Summary (Signed)
  Patient ID: ADELI FROST MRN: [id number] DOB/AGE: 10/22/1950 68 y.o.  Admit date: 08/24/2018 Discharge date: 08/26/2018   Discharge Diagnoses:  Active Problems:   Small bowel obstruction due to adhesions [facility])   Procedures: None  Hospital Course: Patient with previous history of abdominal surgery that came with nausea and vomiting.  CT scan show findings concerning of small bowel obstruction.  Patient was treated with conservative management with nasogastric tube, IV fluids and bowel rest.  On the same day she had a bowel movement and started passing gas.  Next day the NGT was removed and patient was tolerating clear liquid diet.  The diet was advanced to full liquid and eventually to soft diet.  Patient today tolerated eggs for breakfast.  Denies any abdominal pain, nausea or vomiting.  Patient is ambulating and voiding spontaneously.  Physical Exam  Constitutional: She is well-developed, well-nourished, and in no distress.  HENT:  Head: Normocephalic.  Neck: Normal range of motion.  Cardiovascular: Normal rate and regular rhythm.  Pulmonary/Chest: Effort normal and breath sounds normal.  Abdominal: Soft.    Consults: None  Disposition: Discharge disposition: 01-Home or Self Care       Discharge Instructions    Diet - low sodium heart healthy   Complete by:  As directed    Increase activity slowly   Complete by:  As directed      Allergies as of 08/26/2018      Reactions   Adhesive [tape] Other (See Comments)   Pt reports, when removing tape from skin most tapes pull her skin off. Ok to use paper tape   Other Hives, Other (See Comments)   Chlorhexadine wipes/CHG wipes, Pt reports, when removing tape from skin most tapes pull her skin off. Ok to use paper tape   Latex Rash   Morphine And Related Nausea And Vomiting      Medication List    TAKE these medications   aspirin EC 81 MG tablet Take 81 mg by mouth daily.   candesartan 16 MG tablet Commonly  known as:  ATACAND Take 16 mg by mouth daily.   cyclobenzaprine 10 MG tablet Commonly known as:  FLEXERIL Take 10 mg by mouth at bedtime.   gabapentin 100 MG capsule Commonly known as:  NEURONTIN Take 200 mg by mouth 3 (three) times daily.   glipiZIDE 5 MG 24 hr tablet Commonly known as:  GLUCOTROL XL Take 10 mg by mouth daily with breakfast.   HYDROcodone-acetaminophen 5-325 MG tablet Commonly known as:  NORCO/VICODIN Take 1 tablet by mouth 2 (two) times daily as needed for up to 30 days for severe pain. Must last 30 days.   meclizine 25 MG tablet Commonly known as:  ANTIVERT Take 25 mg by mouth daily.   Multi-Vitamins Tabs Take 1 tablet by mouth daily.   rOPINIRole 0.5 MG tablet Commonly known as:  REQUIP Take 0.5 mg by mouth at bedtime.   warfarin 6 MG tablet Commonly known as:  COUMADIN Take 6 mg by mouth daily. During the week and 7 mg on weekends      Follow-up Information    Vickie Epley, MD Follow up in 2 week(s).   Specialty:  General Surgery Contact information: [address] [place] [address] [phone]          This discharge encounter was more than 30 minutes, more than 50% of the time counseling patient and coordinating plan of care.

## 2018-08-31 DIAGNOSIS — Z7901 Long term (current) use of anticoagulants: Secondary | ICD-10-CM | POA: Diagnosis not present

## 2018-09-05 ENCOUNTER — Telehealth: Payer: PPO | Admitting: Nurse Practitioner

## 2018-09-14 ENCOUNTER — Other Ambulatory Visit: Payer: Self-pay

## 2018-09-14 ENCOUNTER — Telehealth (INDEPENDENT_AMBULATORY_CARE_PROVIDER_SITE_OTHER): Payer: PPO | Admitting: Surgery

## 2018-09-14 DIAGNOSIS — K56609 Unspecified intestinal obstruction, unspecified as to partial versus complete obstruction: Secondary | ICD-10-CM | POA: Diagnosis not present

## 2018-09-14 DIAGNOSIS — K439 Ventral hernia without obstruction or gangrene: Secondary | ICD-10-CM

## 2018-09-14 NOTE — Progress Notes (Signed)
Referring provider:  Tracie Harrier, MD [address] [facility] [place], [place] [address]  Virtual Visit via Telemedicine Note  I connected with Kara [name] by telephone at her home on 09/14/18 at 10:00 AM EDT and verified that I was speaking with the correct person using their name and date of birth.   I discussed the limitations, risks, security and privacy concerns of performing an evaluation and management service by telephonic telemedicine and the availability of in person appointments. I also discussed with the patient that there may be a patient responsible charge related to this service. The patient expressed understanding and agreed to proceed.  History of Present Illness: 69 y.o. Female is being evaluated for follow-up after having been recently discharged from [facility] for SBO attributed to post-surgical adhesions. Patient reports complete resolution of abdominal pain, N/V, and abdominal distention and has been tolerating regular diet with +flatus and normal BM's, denies fever/chills, CP, or SOB. Patient further describes her small ventral hernia remains reducible and asymptomatic at this time.  Review of Systems:  Constitutional: denies fever/chills  Respiratory: denies shortness of breath, wheezing  Cardiovascular: denies chest pain, palpitations  Gastrointestinal: abdominal pain, N/V, and bowel function as per interval history Skin: Denies any other rashes or skin discolorations except post-surgical wounds  Imaging: No new pertinent imaging available for review   Assessment:  68 y.o. yo Female with a problem list including...  Patient Active Problem List   Diagnosis Date Noted  . Small bowel obstruction due to adhesions ([place]) 08/24/2018  . Coccygodynia 05/09/2018  . Acute deep vein thrombosis (DVT) of distal end of right lower extremity ([place]) 05/08/2018  . DDD (degenerative disc disease), lumbosacral 04/11/2018  . Chronic anticoagulation  ([place]) 04/11/2018  . Morbid obesity with BMI of 45.0-49.9, adult ([place]) 03/15/2018  . Secondary osteoarthritis of multiple sites 03/15/2018  . Sacral insufficiency fracture 03/15/2018  . Chronic low back pain (Primary Area of Pain) (Bilateral) 12/14/2017  . Spondylosis without myelopathy or radiculopathy, lumbosacral region 12/14/2017  . Other specified dorsopathies, sacral and sacrococcygeal region 12/14/2017  . History of allergy to latex 11/09/2017  . Hyponatremia 11/02/2017  . Hypocalcemia 11/02/2017  . 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 11/02/2017  . Lumbar foraminal stenosis (L3-4 and L4-5) (Bilateral) 11/02/2017  . Lumbosacral L5-S1 subarticular lateral recess stenosis (Bilateral) 11/02/2017  . Lumbar facet arthropathy (L3-4, L4-5, and L5-S1) (Bilateral) 11/02/2017  . Lumbar facet syndrome (Bilateral) 11/02/2017  . 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 Lumbar pars defect (L3 and L4) (Bilateral) 11/02/2017  . Diabetic peripheral neuropathy ([place]) 11/02/2017  . Chronic lower extremity pain (Secondary Area of Pain) (Bilateral) 11/02/2017  . Chronic radicular pain of lower extremity 11/02/2017  . Osteoarthritis of hip (Right) 11/02/2017  . Chronic low back pain (Primary Area of Pain) (Bilateral) w/ sciatica (Bilateral) 10/13/2017  . Chronic sacroiliac joint pain (Right) 10/13/2017  . Chronic pain syndrome 10/13/2017  . Long term current use of opiate analgesic 10/13/2017  . Pharmacologic therapy 10/13/2017  . Disorder of skeletal system 10/13/2017  . Problems influencing health status 10/13/2017  . Diverticulosis of colon 06/20/2017  . History of pulmonary embolism 06/20/2017  . S/P IVC filter 06/20/2017  . H/O ileostomy 06/01/2017  . Positive result for methicillin resistant Staphylococcus aureus (MRSA) screening 05/11/2017  . Ileostomy in place [facility]) 04/08/2017  . History of colostomy reversal 02/24/2017  . Other pulmonary  embolism without acute cor pulmonale ([place]) 11/23/2016  . Acute on  chronic systolic (congestive) heart failure ([place]) 11/18/2016  . HCAP (healthcare-associated pneumonia) 11/18/2016  . Encounter for removal of staples   . Edema of both legs 11/15/2016  . Diverticulitis 10/03/2016  . Diverticulitis large intestine 09/29/2016  . Benign essential hypertension 08/26/2016  . DDD (degenerative disc disease), lumbar 07/14/2016  . Acute non-recurrent maxillary sinusitis 05/21/2016  . Chronic hip pain (Left) 02/23/2016  . Cough 07/30/2015  . Nocturnal leg cramps 02/20/2015  . Diabetes mellitus type 2 in obese ([place]) 11/12/2014  . Type 2 diabetes mellitus without complication, without long-term current use of insulin ([place]) 11/12/2014  . BP (high blood pressure) 11/12/2014    doing well at time of current encounter/evaluation following recent admission to and discharge from [facility] for SBO attributed to post-surgical adhesions, also with a small currently asymptomatic reducible ventral (likely incisional) hernia.  Follow-up Instructions / Plan:               - advance diet as tolerated             - patient advised she may be contacted to schedule a subsequent appointment/exam when such routine follow-up appointments may safely be performed, though patient may certainly decline a subsequent appointment if wishes to do so             - otherwise, return to clinic as needed, instructed to call office if any questions or concerns  All of the above recommendations were discussed with the patient, and all of patient's questions were answered to her expressed satisfaction.  The patient was advised to call back or seek an in-person evaluation if the symptoms worsen or if the condition fails to improve as anticipated.  From [facility] outpatient surgery office, I provided 15 minutes of non-face-to-face time during this encounter.  -- Marilynne Drivers Rosana Hoes, MD, [place]: [facility] - Partnering for exceptional care. Office: [phone]

## 2018-09-20 DIAGNOSIS — Z7901 Long term (current) use of anticoagulants: Secondary | ICD-10-CM | POA: Diagnosis not present

## 2018-10-04 ENCOUNTER — Other Ambulatory Visit: Payer: Self-pay | Admitting: Internal Medicine

## 2018-10-04 DIAGNOSIS — Z1231 Encounter for screening mammogram for malignant neoplasm of breast: Secondary | ICD-10-CM

## 2018-10-13 DIAGNOSIS — Z0000 Encounter for general adult medical examination without abnormal findings: Secondary | ICD-10-CM | POA: Diagnosis not present

## 2018-10-13 DIAGNOSIS — Z7901 Long term (current) use of anticoagulants: Secondary | ICD-10-CM | POA: Diagnosis not present

## 2018-10-13 DIAGNOSIS — R8290 Unspecified abnormal findings in urine: Secondary | ICD-10-CM | POA: Diagnosis not present

## 2018-10-13 DIAGNOSIS — I10 Essential (primary) hypertension: Secondary | ICD-10-CM | POA: Diagnosis not present

## 2018-10-13 DIAGNOSIS — M5136 Other intervertebral disc degeneration, lumbar region: Secondary | ICD-10-CM | POA: Diagnosis not present

## 2018-10-13 DIAGNOSIS — Z6841 Body Mass Index (BMI) 40.0 and over, adult: Secondary | ICD-10-CM | POA: Diagnosis not present

## 2018-10-13 DIAGNOSIS — E1165 Type 2 diabetes mellitus with hyperglycemia: Secondary | ICD-10-CM | POA: Diagnosis not present

## 2018-10-20 DIAGNOSIS — H2513 Age-related nuclear cataract, bilateral: Secondary | ICD-10-CM | POA: Diagnosis not present

## 2018-10-23 ENCOUNTER — Other Ambulatory Visit: Payer: Self-pay

## 2018-10-23 DIAGNOSIS — Z01810 Encounter for preprocedural cardiovascular examination: Secondary | ICD-10-CM

## 2018-10-23 DIAGNOSIS — Z01818 Encounter for other preprocedural examination: Secondary | ICD-10-CM

## 2018-10-24 ENCOUNTER — Ambulatory Visit
Admission: RE | Admit: 2018-10-24 | Discharge: 2018-10-24 | Disposition: A | Discharge status: Home / Self Care | Payer: PPO | Source: Ambulatory Visit | Attending: Internal Medicine | Admitting: Internal Medicine

## 2018-10-24 ENCOUNTER — Other Ambulatory Visit: Payer: Self-pay

## 2018-10-24 DIAGNOSIS — Z1231 Encounter for screening mammogram for malignant neoplasm of breast: Secondary | ICD-10-CM | POA: Diagnosis not present

## 2018-10-25 DIAGNOSIS — Z6841 Body Mass Index (BMI) 40.0 and over, adult: Secondary | ICD-10-CM | POA: Diagnosis not present

## 2018-10-25 DIAGNOSIS — M25521 Pain in right elbow: Secondary | ICD-10-CM | POA: Diagnosis not present

## 2018-10-25 DIAGNOSIS — S59901A Unspecified injury of right elbow, initial encounter: Secondary | ICD-10-CM | POA: Diagnosis not present

## 2018-10-25 DIAGNOSIS — Z86718 Personal history of other venous thrombosis and embolism: Secondary | ICD-10-CM | POA: Diagnosis not present

## 2018-10-25 DIAGNOSIS — K5730 Diverticulosis of large intestine without perforation or abscess without bleeding: Secondary | ICD-10-CM | POA: Diagnosis not present

## 2018-10-25 DIAGNOSIS — I10 Essential (primary) hypertension: Secondary | ICD-10-CM | POA: Diagnosis not present

## 2018-10-25 DIAGNOSIS — M5136 Other intervertebral disc degeneration, lumbar region: Secondary | ICD-10-CM | POA: Diagnosis not present

## 2018-10-25 DIAGNOSIS — E1165 Type 2 diabetes mellitus with hyperglycemia: Secondary | ICD-10-CM | POA: Diagnosis not present

## 2018-10-26 ENCOUNTER — Other Ambulatory Visit
Admission: RE | Admit: 2018-10-26 | Discharge: 2018-10-26 | Disposition: A | Discharge status: Home / Self Care | Payer: PPO | Source: Ambulatory Visit | Attending: Pain Medicine | Admitting: Pain Medicine

## 2018-10-26 ENCOUNTER — Other Ambulatory Visit: Payer: Self-pay

## 2018-10-26 DIAGNOSIS — Z1159 Encounter for screening for other viral diseases: Secondary | ICD-10-CM | POA: Diagnosis not present

## 2018-10-27 LAB — NOVEL CORONAVIRUS, NAA (HOSP ORDER, SEND-OUT TO REF LAB; TAT 18-24 HRS): SARS-CoV-2, NAA: NOT DETECTED

## 2018-10-31 ENCOUNTER — Encounter: Payer: Self-pay | Admitting: Pain Medicine

## 2018-10-31 ENCOUNTER — Other Ambulatory Visit: Payer: Self-pay

## 2018-10-31 ENCOUNTER — Ambulatory Visit (HOSPITAL_BASED_OUTPATIENT_CLINIC_OR_DEPARTMENT_OTHER): Payer: PPO | Admitting: Pain Medicine

## 2018-10-31 ENCOUNTER — Ambulatory Visit
Admission: RE | Admit: 2018-10-31 | Discharge: 2018-10-31 | Disposition: A | Discharge status: Home / Self Care | Payer: PPO | Source: Ambulatory Visit | Attending: Pain Medicine | Admitting: Pain Medicine

## 2018-10-31 VITALS — BP 138/87 | HR 90 | Resp 24 | Ht 63.0 in | Wt 228.0 lb

## 2018-10-31 DIAGNOSIS — M545 Low back pain: Secondary | ICD-10-CM | POA: Diagnosis not present

## 2018-10-31 DIAGNOSIS — G8929 Other chronic pain: Secondary | ICD-10-CM | POA: Diagnosis not present

## 2018-10-31 DIAGNOSIS — M5137 Other intervertebral disc degeneration, lumbosacral region: Secondary | ICD-10-CM | POA: Insufficient documentation

## 2018-10-31 DIAGNOSIS — M5388 Other specified dorsopathies, sacral and sacrococcygeal region: Secondary | ICD-10-CM

## 2018-10-31 DIAGNOSIS — M47817 Spondylosis without myelopathy or radiculopathy, lumbosacral region: Secondary | ICD-10-CM | POA: Insufficient documentation

## 2018-10-31 DIAGNOSIS — M533 Sacrococcygeal disorders, not elsewhere classified: Secondary | ICD-10-CM | POA: Diagnosis not present

## 2018-10-31 DIAGNOSIS — M47816 Spondylosis without myelopathy or radiculopathy, lumbar region: Secondary | ICD-10-CM

## 2018-10-31 DIAGNOSIS — G894 Chronic pain syndrome: Secondary | ICD-10-CM | POA: Diagnosis not present

## 2018-10-31 MED ORDER — LIDOCAINE HCL 2 % IJ SOLN
20.0000 mL | Freq: Once | INTRAMUSCULAR | Status: AC
  Administered 2018-10-31: 400 mg
  Filled 2018-10-31: qty 200

## 2018-10-31 MED ORDER — HYDROCODONE-ACETAMINOPHEN 5-325 MG PO TABS: 1.0000 | ORAL_TABLET | Freq: Two times a day (BID) | ORAL | 0 refills | Status: DC | PRN

## 2018-10-31 MED ORDER — FENTANYL CITRATE (PF) 100 MCG/2ML IJ SOLN: 25.0000 ug | INTRAMUSCULAR | Status: DC | PRN

## 2018-10-31 MED ORDER — MIDAZOLAM HCL 5 MG/5ML IJ SOLN: 1.0000 mg | INTRAMUSCULAR | Status: DC | PRN

## 2018-10-31 MED ORDER — METHYLPREDNISOLONE ACETATE 80 MG/ML IJ SUSP
80.0000 mg | Freq: Once | INTRAMUSCULAR | Status: AC
  Administered 2018-10-31: 80 mg via INTRA_ARTICULAR
  Filled 2018-10-31: qty 1

## 2018-10-31 MED ORDER — TRIAMCINOLONE ACETONIDE 40 MG/ML IJ SUSP
40.0000 mg | Freq: Once | INTRAMUSCULAR | Status: AC
  Administered 2018-10-31: 40 mg
  Filled 2018-10-31: qty 1

## 2018-10-31 MED ORDER — ROPIVACAINE HCL 2 MG/ML IJ SOLN
9.0000 mL | Freq: Once | INTRAMUSCULAR | Status: AC
  Administered 2018-10-31: 9 mL via PERINEURAL
  Filled 2018-10-31: qty 10

## 2018-10-31 MED ORDER — LACTATED RINGERS IV SOLN: 1000.0000 mL | Freq: Once | INTRAVENOUS | Status: DC

## 2018-10-31 MED ORDER — ROPIVACAINE HCL 2 MG/ML IJ SOLN
4.0000 mL | Freq: Once | INTRAMUSCULAR | Status: AC
  Administered 2018-10-31: 4 mL via INTRA_ARTICULAR
  Filled 2018-10-31: qty 10

## 2018-10-31 NOTE — Progress Notes (Signed)
Patient's Name: Kara [name]  MRN: [id number]  Referring Provider: Milinda Pointer, MD  DOB: 12/11/1950  PCP: Tracie Harrier, MD  DOS: 10/31/2018  Note by: Gaspar Cola, MD  Service setting: Ambulatory outpatient  Specialty: Interventional Pain Management  Patient type: Established  Location: [name] (AMB) Pain Management Facility  Visit type: Interventional Procedure   Primary Reason for Visit: Interventional Pain Management Treatment. CC: Back Pain (low)  Procedure:          Anesthesia, Analgesia, Anxiolysis:  Procedure #1: Type: Medial Branch Facet Block         Primary Purpose: Palliative Region: Lumbar Level: L2, L3, L4, L5, & S1 Medial Branch Level(s) Target Area: For Lumbar Facet blocks, the target is the groove formed by the junction of the transverse process and superior articular process. For the L5 dorsal ramus, the target is the notch between superior articular process and sacral ala. For the S1 dorsal ramus, the target is the superior and lateral edge of the posterior S1 Sacral foramen. Approach: Posterior, paramedial, percutaneous approach. Laterality: Right  Procedure #2: Type: Sacroiliac Joint Block           Primary Purpose: Palliative Region: Posterior Lumbosacral Level: PSIS (Posterior Superior Iliac Spine) Sacroiliac Joint Target Area: For upper sacroiliac joint block(s), the target is the superior and posterior margin of the sacroiliac joint. Approach: Ipsilateral approach. Laterality: Right  Type: Moderate (Conscious) Sedation combined with Local Anesthesia Indication(s): Analgesia and Anxiety Route: Intravenous (IV) IV Access: Secured Sedation: Meaningful verbal contact was maintained at all times during the procedure  Local Anesthetic: Lidocaine 1-2%  Position: Prone   Indications: 1. Spondylosis without myelopathy or radiculopathy, lumbosacral region   2. Lumbar facet syndrome (Bilateral)   3. Lumbar facet arthropathy (L3-4, L4-5, and  L5-S1) (Bilateral)   4. Other specified dorsopathies, sacral and sacrococcygeal region   5. Chronic sacroiliac joint pain (Right)   6. Chronic low back pain (Primary Area of Pain) (Bilateral)    Pain Score: Pre-procedure: 10-Worst pain ever/10 Post-procedure: 0-No pain/10  Pre-op Assessment:  Kara [name] is a 68 y.o. (year old), female patient, seen today for interventional treatment. She  has a past surgical history that includes Knee surgery; Carpal tunnel release (Bilateral); Colon resection sigmoid (N/A, 11/02/2016); Colostomy (N/A, 11/02/2016); Sigmoidoscopy (N/A, 11/13/2016); PULMONARY VENOGRAPHY (N/A, 11/19/2016); Colonoscopy with propofol (N/A, 02/03/2017); Colon surgery (11/02/2016); IVC FILTER INSERTION (Right, 10/2016); Colostomy reversal (N/A, 02/24/2017); Appendectomy (N/A, 02/24/2017); Lysis of adhesion (N/A, 02/24/2017); laparoscopy (N/A, 02/24/2017); Ileo loop diversion (N/A, 02/24/2017); Sigmoidoscopy (N/A, 05/19/2017); Ileostomy closure (N/A, 06/01/2017); IVC FILTER REMOVAL (N/A, 08/09/2017); and Sacroplasty (N/A, 11/08/2017). Kara [name] has a current medication list which includes the following prescription(s): aspirin ec, candesartan, cyclobenzaprine, gabapentin, glipizide, hydrocodone-acetaminophen, meclizine, multi-vitamins, ropinirole, warfarin, and warfarin, and the following Facility-Administered Medications: fentanyl, lactated ringers, and midazolam. Her primarily concern today is the Back Pain (low)  Initial Vital Signs:  Pulse/HCG Rate: 90ECG Heart Rate: 84 Temp:   Resp: 18 BP: 139/83 SpO2: 97 %  BMI: Estimated body mass index is 40.39 kg/m as calculated from the following:   Height as of this encounter: 5\' 3"  (1.6 m).   Weight as of this encounter: 228 lb (103.4 kg).  Risk Assessment: Allergies: Reviewed. She is allergic to adhesive [tape]; other; latex; and morphine and related.  Allergy Precautions: None required Coagulopathies: Reviewed. None  identified.  Blood-thinner therapy: None at this time Active Infection(s): Reviewed. None identified. Kara [name] is afebrile  Site Confirmation: Kara [name] was asked to confirm  the procedure and laterality before marking the site Procedure checklist: Completed Consent: Before the procedure and under the influence of no sedative(s), amnesic(s), or anxiolytics, the patient was informed of the treatment options, risks and possible complications. To fulfill our ethical and legal obligations, as recommended by the American Medical Association's Code of Ethics, I have informed the patient of my clinical impression; the nature and purpose of the treatment or procedure; the risks, benefits, and possible complications of the intervention; the alternatives, including doing nothing; the risk(s) and benefit(s) of the alternative treatment(s) or procedure(s); and the risk(s) and benefit(s) of doing nothing. The patient was provided information about the general risks and possible complications associated with the procedure. These may include, but are not limited to: failure to achieve desired goals, infection, bleeding, organ or nerve damage, allergic reactions, paralysis, and death. In addition, the patient was informed of those risks and complications associated to Spine-related procedures, such as failure to decrease pain; infection (i.e.: Meningitis, epidural or intraspinal abscess); bleeding (i.e.: epidural hematoma, subarachnoid hemorrhage, or any other type of intraspinal or peri-dural bleeding); organ or nerve damage (i.e.: Any type of peripheral nerve, nerve root, or spinal cord injury) with subsequent damage to sensory, motor, and/or autonomic systems, resulting in permanent pain, numbness, and/or weakness of one or several areas of the body; allergic reactions; (i.e.: anaphylactic reaction); and/or death. Furthermore, the patient was informed of those risks and complications associated  with the medications. These include, but are not limited to: allergic reactions (i.e.: anaphylactic or anaphylactoid reaction(s)); adrenal axis suppression; blood sugar elevation that in diabetics may result in ketoacidosis or comma; water retention that in patients with history of congestive heart failure may result in shortness of breath, pulmonary edema, and decompensation with resultant heart failure; weight gain; swelling or edema; medication-induced neural toxicity; particulate matter embolism and blood vessel occlusion with resultant organ, and/or nervous system infarction; and/or aseptic necrosis of one or more joints. Finally, the patient was informed that Medicine is not an exact science; therefore, there is also the possibility of unforeseen or unpredictable risks and/or possible complications that may result in a catastrophic outcome. The patient indicated having understood very clearly. We have given the patient no guarantees and we have made no promises. Enough time was given to the patient to ask questions, all of which were answered to the patient's satisfaction. Kara [name] has indicated that she wanted to continue with the procedure. Attestation: I, the ordering provider, attest that I have discussed with the patient the benefits, risks, side-effects, alternatives, likelihood of achieving goals, and potential problems during recovery for the procedure that I have provided informed consent. Date  Time: 10/31/2018 10:03 AM  Pre-Procedure Preparation:  Monitoring: As per clinic protocol. Respiration, ETCO2, SpO2, BP, heart rate and rhythm monitor placed and checked for adequate function Safety Precautions: Patient was assessed for positional comfort and pressure points before starting the procedure. Time-out: I initiated and conducted the "Time-out" before starting the procedure, as per protocol. The patient was asked to participate by confirming the accuracy of the "Time Out"  information. Verification of the correct person, site, and procedure were performed and confirmed by me, the nursing staff, and the patient. "Time-out" conducted as per Joint Commission's Universal Protocol (UP.01.01.01). Time: 1125  Description of Procedure #1:   Time-out: "Time-out" completed before starting procedure, as per protocol. Area Prepped: Entire Posterior Lumbosacral Region Prepping solution: 72M DuraPrep (Iodine Povacrylex [0.7% available iodine] and Isopropyl Alcohol, 74% w/w) Safety Precautions: Aspiration looking for  blood return was conducted prior to all injections. At no point did we inject any substances, as a needle was being advanced. No attempts were made at seeking any paresthesias. Safe injection practices and needle disposal techniques used. Medications properly checked for expiration dates. SDV (single dose vial) medications used.  Description of the Procedure: Protocol guidelines were followed. The patient was placed in position over the fluoroscopy table. The target area was identified and the area prepped in the usual manner. Skin & deeper tissues infiltrated with local anesthetic. Appropriate amount of time allowed to pass for local anesthetics to take effect. The procedure needle was introduced through the skin, ipsilateral to the reported pain, and advanced to the target area. Employing the "Medial Branch Technique", the needles were advanced to the angle made by the superior and medial portion of the transverse process, and the lateral and inferior portion of the superior articulating process of the targeted vertebral bodies. This area is known as "Burton's Eye" or the "Eye of the [place] Dog". A procedure needle was introduced through the skin, and this time advanced to the angle made by the superior and medial border of the sacral ala, and the lateral border of the S1 vertebral body. This last needle was later repositioned at the superior and lateral border of the  posterior S1 foramen. Negative aspiration confirmed. Solution injected in intermittent fashion, asking for systemic symptoms every 0.5cc of injectate. The needles were then removed and the area cleansed, making sure to leave some of the prepping solution back to take advantage of its long term bactericidal properties. Start Time: 1125 hrs. Materials:  Needle(s) Type: Spinal Needle Gauge: 22G Length: 5-in Medication(s): Please see orders for medications and dosing details.  Description of Procedure # 2:   Area Prepped: Entire Posterior Lumbosacral Region Prepping solution: 58M DuraPrep (Iodine Povacrylex [0.7% available iodine] and Isopropyl Alcohol, 74% w/w) Safety Precautions: Aspiration looking for blood return was conducted prior to all injections. At no point did we inject any substances, as a needle was being advanced. No attempts were made at seeking any paresthesias. Safe injection practices and needle disposal techniques used. Medications properly checked for expiration dates. SDV (single dose vial) medications used. Description of the Procedure: Protocol guidelines were followed. The patient was placed in position over the fluoroscopy table. The target area was identified and the area prepped in the usual manner. Skin & deeper tissues infiltrated with local anesthetic. Appropriate amount of time allowed to pass for local anesthetics to take effect. The procedure needle was advanced under fluoroscopic guidance into the sacroiliac joint until a firm endpoint was obtained. Proper needle placement secured. Negative aspiration confirmed. Solution injected in intermittent fashion, asking for systemic symptoms every 0.5cc of injectate. The needles were then removed and the area cleansed, making sure to leave some of the prepping solution back to take advantage of its long term bactericidal properties. Vitals:   10/31/18 1127 10/31/18 1129 10/31/18 1131 10/31/18 1136  BP: 110/70 (!) 62/32 (!) 68/43  138/87  Pulse:      Resp: (!) 22 17 18  (!) 24  SpO2: 94% 92% 94% 95%  Weight:      Height:        End Time: 1133 hrs. Materials:  Needle(s) Type: Spinal Needle Gauge: 22G Length: 5-in Medication(s): Please see orders for medications and dosing details.  Imaging Guidance (Spinal):          Type of Imaging Technique: Fluoroscopy Guidance (Spinal) Indication(s): Assistance in needle guidance and  placement for procedures requiring needle placement in or near specific anatomical locations not easily accessible without such assistance. Exposure Time: Please see nurses notes. Contrast: None used. Fluoroscopic Guidance: I was personally present during the use of fluoroscopy. "Tunnel Vision Technique" used to obtain the best possible view of the target area. Parallax error corrected before commencing the procedure. "Direction-depth-direction" technique used to introduce the needle under continuous pulsed fluoroscopy. Once target was reached, antero-posterior, oblique, and lateral fluoroscopic projection used confirm needle placement in all planes. Images permanently stored in EMR. Interpretation: No contrast injected. I personally interpreted the imaging intraoperatively. Adequate needle placement confirmed in multiple planes. Permanent images saved into the patient's record.  Antibiotic Prophylaxis:   Anti-infectives (From admission, onward)   None     Indication(s): None identified  Post-operative Assessment:  Post-procedure Vital Signs:  Pulse/HCG Rate: 9085 Temp:   Resp: (!) 24 BP: 138/87 SpO2: 95 %  EBL: None  Complications: No immediate post-treatment complications observed by team, or reported by patient.  Note: The patient tolerated the entire procedure well. A repeat set of vitals were taken after the procedure and the patient was kept under observation following institutional policy, for this type of procedure. Post-procedural neurological assessment was performed, showing  return to baseline, prior to discharge. The patient was provided with post-procedure discharge instructions, including a section on how to identify potential problems. Should any problems arise concerning this procedure, the patient was given instructions to immediately contact us, at any time, without hesitation. In any case, we plan to contact the patient by telephone for a follow-up status report regarding this interventional procedure.  Comments:  No additional relevant information.  Plan of Care  Orders:  Orders Placed This Encounter  Procedures  . LUMBAR FACET(MEDIAL BRANCH NERVE BLOCK) MBNB    Scheduling Instructions:     Side: Right-sided     Level: L3-4, L4-5, & L5-S1 Facets (L2, L3, L4, L5, & S1 Medial Branch Nerves)     Sedation: With Sedation.     Timeframe: Today    Order Specific Question:   Where will this procedure be performed?    Answer:   [name]  . SACROILIAC JOINT INJECTION    Scheduling Instructions:     Side: Right-sided     Sedation: With Sedation.     Timeframe: Today    Order Specific Question:   Where will this procedure be performed?    Answer:   [name]  . DG PAIN CLINIC C-ARM 1-60 MIN NO REPORT    Intraoperative interpretation by procedural physician at [place].    Standing Status:   Standing    Number of Occurrences:   1    Order Specific Question:   Reason for exam:    Answer:   Assistance in needle guidance and placement for procedures requiring needle placement in or near specific anatomical locations not easily accessible without such assistance.  . Provider attestation of informed consent for procedure/surgical case    I, the ordering provider, attest that I have discussed with the patient the benefits, risks, side effects, alternatives, likelihood of achieving goals and potential problems during recovery for the procedure that I have provided informed consent.    Standing Status:   Standing    Number of  Occurrences:   1  . Informed Consent Details: Transcribe to consent form and obtain patient signature    Standing Status:   Standing    Number of Occurrences:   1  Order Specific Question:   Procedure    Answer:   Lumbar facet block (medial branch block) under fluoroscopic guidance. (See notes for levels and laterality.)    Order Specific Question:   Surgeon    Answer:   Willys Salvino A. Dossie Arbour, MD    Order Specific Question:   Indication/Reason    Answer:   Low back pain with or without lower extremity pain.  . Informed Consent Details: Transcribe to consent form and obtain patient signature    Consent Attestation: I, the ordering provider, attest that I have discussed with the patient the benefits, risks, side-effects, alternatives, likelihood of achieving goals, and potential problems during recovery for the procedure that I have provided informed consent.    Standing Status:   Standing    Number of Occurrences:   1    Order Specific Question:   Procedure    Answer:   Diagnostic, right-sided, Sacroiliac joint injection under fluoroscopic guidance    Order Specific Question:   Surgeon    Answer:   Mannie Wineland A. Dossie Arbour, MD    Order Specific Question:   Indication/Reason    Answer:   Low back pain secondary sleep to sacroiliac joint dysfunction/arthralgia   Medications ordered for procedure: Meds ordered this encounter  Medications  . lidocaine (XYLOCAINE) 2 % (with pres) injection 400 mg  . lactated ringers infusion 1,000 mL  . midazolam (VERSED) 5 MG/5ML injection 1-2 mg    Make sure Flumazenil is available in the pyxis when using this medication. If oversedation occurs, administer 0.2 mg IV over 15 sec. If after 45 sec no response, administer 0.2 mg again over 1 min; may repeat at 1 min intervals; not to exceed 4 doses (1 mg)  . fentaNYL (SUBLIMAZE) injection 25-50 mcg    Make sure Narcan is available in the pyxis when using this medication. In the event of respiratory depression  (RR< 8/min): Titrate NARCAN (naloxone) in increments of 0.1 to 0.2 mg IV at 2-3 minute intervals, until desired degree of reversal.  . ropivacaine (PF) 2 mg/mL (0.2%) (NAROPIN) injection 9 mL  . triamcinolone acetonide (KENALOG-40) injection 40 mg  . ropivacaine (PF) 2 mg/mL (0.2%) (NAROPIN) injection 4 mL  . methylPREDNISolone acetate (DEPO-MEDROL) injection 80 mg  . HYDROcodone-acetaminophen (NORCO/VICODIN) 5-325 MG tablet    Sig: Take 1 tablet by mouth 2 (two) times daily as needed for up to 30 days for severe pain. Must last 30 days.    Dispense:  60 tablet    Refill:  0    Chronic Pain: STOP Act - Not applicable. Fill 1 day early if closed on scheduled refill date. Do not fill until: 10/31/2018. To last until: 04/29/2019. Instruct to avoid benzodiazepines within 8 hours of opioid.   Medications administered: We administered lidocaine, ropivacaine (PF) 2 mg/mL (0.2%), triamcinolone acetonide, ropivacaine (PF) 2 mg/mL (0.2%), and methylPREDNISolone acetate.  See the medical record for exact dosing, route, and time of administration.  Disposition: Discharge home  Discharge Date & Time: 10/31/2018; 1139 hrs.   Follow-up plan:   Return in about 2 weeks (around 11/14/2018) for (Virtual), E/M, (Post-procedrure).     Future Appointments  Date Time Provider [place]  11/13/2018  1:30 PM Milinda Pointer, MD [facility] None   Primary Care Physician: Tracie Harrier, MD Location: [facility] Note by: Gaspar Cola, MD Date: 10/31/2018; Time: 1:14 PM  Disclaimer:  Medicine is not an Chief Strategy Officer. The only guarantee in medicine is that nothing is guaranteed.  It is important to note that the decision to proceed with this intervention was based on the information collected from the patient. The Data and conclusions were drawn from the patient's questionnaire, the interview, and the physical examination. Because the information was provided in large part by  the patient, it cannot be guaranteed that it has not been purposely or unconsciously manipulated. Every effort has been made to obtain as much relevant data as possible for this evaluation. It is important to note that the conclusions that lead to this procedure are derived in large part from the available data. Always take into account that the treatment will also be dependent on availability of resources and existing treatment guidelines, considered by other Pain Management Practitioners as being common knowledge and practice, at the time of the intervention. For Medico-Legal purposes, it is also important to point out that variation in procedural techniques and pharmacological choices are the acceptable norm. The indications, contraindications, technique, and results of the above procedure should only be interpreted and judged by a Board-Certified Interventional Pain Specialist with extensive familiarity and expertise in the same exact procedure and technique.

## 2018-10-31 NOTE — Progress Notes (Signed)
Safety precautions to be maintained throughout the outpatient stay will include: orient to surroundings, keep bed in low position, maintain call bell within reach at all times, provide assistance with transfer out of bed and ambulation.  

## 2018-10-31 NOTE — Patient Instructions (Signed)

## 2018-11-01 ENCOUNTER — Telehealth: Payer: Self-pay

## 2018-11-01 NOTE — Telephone Encounter (Signed)
Post procedure phone call.  LM 

## 2018-11-09 ENCOUNTER — Encounter: Payer: Self-pay | Admitting: Pain Medicine

## 2018-11-09 DIAGNOSIS — Z7901 Long term (current) use of anticoagulants: Secondary | ICD-10-CM | POA: Diagnosis not present

## 2018-11-13 ENCOUNTER — Other Ambulatory Visit: Payer: Self-pay

## 2018-11-13 ENCOUNTER — Ambulatory Visit: Payer: PPO | Attending: Pain Medicine | Admitting: Pain Medicine

## 2018-11-13 DIAGNOSIS — M533 Sacrococcygeal disorders, not elsewhere classified: Secondary | ICD-10-CM

## 2018-11-13 DIAGNOSIS — M5441 Lumbago with sciatica, right side: Secondary | ICD-10-CM

## 2018-11-13 DIAGNOSIS — M79604 Pain in right leg: Secondary | ICD-10-CM

## 2018-11-13 DIAGNOSIS — G8929 Other chronic pain: Secondary | ICD-10-CM

## 2018-11-13 DIAGNOSIS — M47816 Spondylosis without myelopathy or radiculopathy, lumbar region: Secondary | ICD-10-CM

## 2018-11-13 DIAGNOSIS — M5442 Lumbago with sciatica, left side: Secondary | ICD-10-CM | POA: Diagnosis not present

## 2018-11-13 DIAGNOSIS — Z7901 Long term (current) use of anticoagulants: Secondary | ICD-10-CM

## 2018-11-13 DIAGNOSIS — M79605 Pain in left leg: Secondary | ICD-10-CM

## 2018-11-13 DIAGNOSIS — H2512 Age-related nuclear cataract, left eye: Secondary | ICD-10-CM | POA: Diagnosis not present

## 2018-11-13 DIAGNOSIS — G894 Chronic pain syndrome: Secondary | ICD-10-CM | POA: Diagnosis not present

## 2018-11-13 DIAGNOSIS — I509 Heart failure, unspecified: Secondary | ICD-10-CM | POA: Diagnosis not present

## 2018-11-13 NOTE — Patient Instructions (Signed)

## 2018-11-13 NOTE — Progress Notes (Signed)
Pain Management Virtual Encounter Note - Virtual Visit via Telephone Telehealth (real-time audio visits between healthcare provider and patient).   Patient's Phone No. & Preferred Pharmacy:  [phone] (home); [phone] (mobile); (Preferred) [phone] [email or web address]  [place], [place] [address] Phone: [phone] Fax: [phone]    Pre-screening note:  Our staff contacted [name] and offered her an "in person", "face-to-face" appointment versus a telephone encounter. She indicated preferring the telephone encounter, at this time.   Reason for Virtual Visit: COVID-19*  Social distancing based on CDC and AMA recommendations.   I contacted [name] on 11/13/2018 via telephone.      I clearly identified myself as Gaspar Cola, MD. I verified that I was speaking with the correct person using two identifiers (Name: Kara [name], and date of birth: Nov 11, 1950).  Advanced Informed Consent I sought verbal advanced consent from [name] for virtual visit interactions. I informed [name] of possible security and privacy concerns, risks, and limitations associated with providing "not-in-person" medical evaluation and management services. I also informed [name] of the availability of "in-person" appointments. Finally, I informed her that there would be a charge for the virtual visit and that she could be  personally, fully or partially, financially responsible for it. [name] expressed understanding and agreed to proceed.   Historic Elements   Kara [name] is a 68 y.o. year old, female patient evaluated today after her last encounter by our practice on 11/01/2018. Ms. 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 She also  has a past surgical history that includes Knee surgery; Carpal tunnel release (Bilateral); Colon resection sigmoid (N/A, 11/02/2016); Colostomy (N/A, 11/02/2016); Sigmoidoscopy (N/A, 11/13/2016); PULMONARY VENOGRAPHY (N/A, 11/19/2016); Colonoscopy with propofol (N/A, 02/03/2017); Colon surgery (11/02/2016); IVC FILTER INSERTION (Right, 10/2016); Colostomy reversal (N/A, 02/24/2017); Appendectomy (N/A, 02/24/2017); Lysis of adhesion (N/A, 02/24/2017); laparoscopy (N/A, 02/24/2017); Ileo loop diversion (N/A, 02/24/2017); Sigmoidoscopy (N/A, 05/19/2017); Ileostomy closure (N/A, 06/01/2017); IVC FILTER REMOVAL (N/A, 08/09/2017); and Sacroplasty (N/A, 11/08/2017). [name] has a current medication list which includes the following prescription(s): aspirin ec, candesartan, cyclobenzaprine, gabapentin, glipizide, hydrocodone-acetaminophen, meclizine, multi-vitamins, ropinirole, warfarin, and warfarin. She  reports that she has never smoked. She has never used smokeless tobacco. She reports that she does not drink alcohol or use drugs. [name] is allergic to adhesive [tape]; other; latex; and morphine and related.   HPI  Today, she is being contacted for both, medication management and a post-procedure assessment.  The patient is extremely happy with the results of her recent procedure and would like to have it as a PRN procedure.   Pharmacotherapy Assessment  Analgesic: Hydrocodone/APAP 5/325 1 tablet p.o. twice daily #60 (she takes this medication PRN, which explains why and will last several months) (10 mg/day of hydrocodone) (last prescribed and filled on 10/31/2018) MME/day: 0-10 mg/day.   Monitoring: Pharmacotherapy: No side-effects or adverse reactions reported. [place] PMP: PDMP reviewed during this encounter.       Compliance: No problems identified. Effectiveness: Clinically acceptable. Plan:  Refer to "POC".  Post-Procedure Evaluation  Procedure: Diagnostic right-sided sacroiliac joint block + lumbar facet block under fluoroscopy guidance and IV sedation Pre-procedure pain level:  10/10 Post-procedure: 0/10 (100% relief)  Sedation: Please see nurses note.  Effectiveness during initial hour after procedure(Ultra-Short  Term Relief): 90 %   Local anesthetic used: Long-acting (4-6 hours) Effectiveness: Defined as any analgesic benefit obtained secondary to the administration of local anesthetics. This carries significant diagnostic value as to the etiological location, or anatomical origin, of the pain. Duration of benefit is expected to coincide with the duration of the local anesthetic used.  Effectiveness during initial 4-6 hours after procedure(Short-Term Relief): 90 %   Long-term benefit: Defined as any relief past the pharmacologic duration of the local anesthetics.  Effectiveness past the initial 6 hours after procedure(Long-Term Relief): 90 %   Current benefits: Defined as benefit that persist at this time.   Analgesia:  >50% relief Function: [name] reports improvement in function ROM: [name] reports improvement in ROM  Pertinent Labs   SAFETY SCREENING Profile Lab Results  Component Value Date   SARSCOV2NAA NOT DETECTED 10/26/2018   COVIDSOURCE NASOPHARYNGEAL 10/26/2018   STAPHAUREUS POSITIVE (A) 05/26/2017   MRSAPCR POSITIVE (A) 08/24/2018   HIV Non Reactive 08/25/2018   Renal Function Lab Results  Component Value Date   BUN 11 08/26/2018   CREATININE 0.73 08/26/2018   GFRAA >60 08/26/2018   GFRNONAA >60 08/26/2018   Hepatic Function Lab Results  Component Value Date   AST 44 (H) 08/24/2018   ALT 53 (H) 08/24/2018   ALBUMIN 3.9 08/24/2018   UDS No results found for: SUMMARY Note: Above Lab results reviewed.  Recent imaging  MM 3D SCREEN BREAST BILATERAL CLINICAL DATA:  Screening.  EXAM: DIGITAL SCREENING BILATERAL  MAMMOGRAM WITH TOMO AND CAD  COMPARISON:  Previous exam(s).  ACR Breast Density Category a: The breast tissue is almost entirely fatty.  FINDINGS: There are no findings suspicious for malignancy. Images were processed with CAD.  IMPRESSION: No mammographic evidence of malignancy. A result letter of this screening mammogram will be mailed directly to the patient.  RECOMMENDATION: Screening mammogram in one year. (Code:[id number])  BI-RADS CATEGORY  1: Negative.  Electronically Signed   By: Kristopher Oppenheim M.D.   On: 10/24/2018 10:38  Assessment  The primary encounter diagnosis was Chronic pain syndrome. 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  Plan of Care  I am having [name] maintain her aspirin EC, cyclobenzaprine, Multi-Vitamins, glipiZIDE, candesartan, gabapentin, meclizine, rOPINIRole, warfarin, warfarin, and HYDROcodone-acetaminophen.  Pharmacotherapy (Medications Ordered): No orders of the defined types were placed in this encounter.  Orders:  Orders Placed This Encounter  Procedures  . LUMBAR FACET(MEDIAL BRANCH NERVE BLOCK) MBNB    Standing Status:   Standing    Number of Occurrences:   5    Standing Expiration Date:   11/13/2019    Scheduling Instructions:     Purpose: Palliative     Indication: Axial low back pain. Lumbosacral Spondylosis (M47.897).      Side: Right-sided     Level: L3-4, L4-5, & L5-S1 Facets (L2, L3, L4, L5, & S1 Medial Branch Nerves)     Sedation: With Sedation.     TIMEFRAME: PRN procedure. (Ms. Susman will call when needed.)    Order Specific Question:   Where will this procedure be performed?    Answer:   [name]  . SACROILIAC JOINT  INJECTION    For low back pain and groin pain.    Standing Status:   Standing  Number of Occurrences:   1    Standing Expiration Date:   11/13/2019    Scheduling Instructions:     Side: Right-sided     Sedation: Patient's choice.     TIMEFRAME: PRN procedure. (Ms. Gombos will call when needed.)    Order Specific Question:   Where will this procedure be performed?    Answer:   [name]  . Blood Thinner Instructions to Nursing    If the patient requires a Lovenox-bridge therapy, make sure arrangements are made to institute it with the assistance of the PCP.    Standing Status:   Standing    Number of Occurrences:   36    Standing Expiration Date:   05/14/2020    Scheduling Instructions:     Always stop the Coumadin (Warfarin) X 5 days prior to procedure or surgery.   Follow-up plan:   Return for PRN Procedure (w/ sedation): (R) S-I BLK + (R) L-FCT Blk.    Recent Visits Date Type Provider Dept  10/31/18 Procedure visit Milinda Pointer, MD [name]  08/22/18 Procedure visit Milinda Pointer, MD [name]  Showing recent visits within past 90 days and meeting all other requirements   Today's Visits Date Type Provider Dept  11/13/18 Office Visit Milinda Pointer, MD [name]  Showing today's visits and meeting all other requirements   Future Appointments No visits were found meeting these conditions.  Showing future appointments within next 90 days and meeting all other requirements   I discussed the assessment and treatment plan with the patient. The patient was provided an opportunity to ask questions and all were answered. The patient agreed with the plan and demonstrated an understanding of the instructions.  Patient advised to call back or seek an in-person evaluation if the symptoms or condition worsens.  Total duration of non-face-to-face encounter: 9 minutes.  Note by: Gaspar Cola, MD Date:  11/13/2018; Time: 2:17 PM  Note: This dictation was prepared with Dragon dictation. Any transcriptional errors that may result from this process are unintentional.  Disclaimer:  * Given the special circumstances of the COVID-19 pandemic, the federal government has announced that the Office for Civil Rights (OCR) will exercise its enforcement discretion and will not impose penalties on physicians using telehealth in the event of noncompliance with regulatory requirements under the [place] (HIPAA) in connection with the good faith provision of telehealth during the JOACZ-66 national public health emergency. ([place])

## 2018-11-14 ENCOUNTER — Encounter: Payer: Self-pay | Admitting: *Deleted

## 2018-11-14 ENCOUNTER — Other Ambulatory Visit: Payer: Self-pay

## 2018-11-17 ENCOUNTER — Other Ambulatory Visit: Payer: Self-pay

## 2018-11-17 ENCOUNTER — Other Ambulatory Visit
Admission: RE | Admit: 2018-11-17 | Discharge: 2018-11-17 | Disposition: A | Discharge status: Home / Self Care | Payer: PPO | Source: Ambulatory Visit | Attending: Ophthalmology | Admitting: Ophthalmology

## 2018-11-17 DIAGNOSIS — Z1159 Encounter for screening for other viral diseases: Secondary | ICD-10-CM | POA: Diagnosis not present

## 2018-11-18 LAB — NOVEL CORONAVIRUS, NAA (HOSP ORDER, SEND-OUT TO REF LAB; TAT 18-24 HRS): SARS-CoV-2, NAA: NOT DETECTED

## 2018-11-21 ENCOUNTER — Ambulatory Visit
Admission: RE | Admit: 2018-11-21 | Discharge: 2018-11-21 | Disposition: A | Discharge status: Home / Self Care | Payer: PPO | Source: Ambulatory Visit | Attending: Ophthalmology | Admitting: Ophthalmology

## 2018-11-21 ENCOUNTER — Ambulatory Visit: Payer: PPO | Admitting: Anesthesiology

## 2018-11-21 ENCOUNTER — Encounter
Admission: RE | Disposition: A | Discharge status: Home / Self Care | Payer: Self-pay | Source: Ambulatory Visit | Attending: Ophthalmology

## 2018-11-21 DIAGNOSIS — Z885 Allergy status to narcotic agent status: Secondary | ICD-10-CM | POA: Insufficient documentation

## 2018-11-21 DIAGNOSIS — Z7982 Long term (current) use of aspirin: Secondary | ICD-10-CM | POA: Insufficient documentation

## 2018-11-21 DIAGNOSIS — Z7984 Long term (current) use of oral hypoglycemic drugs: Secondary | ICD-10-CM | POA: Diagnosis not present

## 2018-11-21 DIAGNOSIS — E1136 Type 2 diabetes mellitus with diabetic cataract: Secondary | ICD-10-CM | POA: Insufficient documentation

## 2018-11-21 DIAGNOSIS — H2512 Age-related nuclear cataract, left eye: Secondary | ICD-10-CM | POA: Diagnosis not present

## 2018-11-21 DIAGNOSIS — M1990 Unspecified osteoarthritis, unspecified site: Secondary | ICD-10-CM | POA: Diagnosis not present

## 2018-11-21 DIAGNOSIS — Z7901 Long term (current) use of anticoagulants: Secondary | ICD-10-CM | POA: Insufficient documentation

## 2018-11-21 DIAGNOSIS — H25812 Combined forms of age-related cataract, left eye: Secondary | ICD-10-CM | POA: Diagnosis not present

## 2018-11-21 DIAGNOSIS — Z79899 Other long term (current) drug therapy: Secondary | ICD-10-CM | POA: Insufficient documentation

## 2018-11-21 HISTORY — DX: Dizziness and giddiness: R42

## 2018-11-21 HISTORY — PX: CATARACT EXTRACTION W/PHACO: SHX586

## 2018-11-21 HISTORY — DX: Age-related osteoporosis without current pathological fracture: M81.0

## 2018-11-21 LAB — GLUCOSE, CAPILLARY
Glucose-Capillary: 143 mg/dL — ABNORMAL HIGH (ref 70–99)
Glucose-Capillary: 135 mg/dL — ABNORMAL HIGH (ref 70–99)

## 2018-11-21 SURGERY — PHACOEMULSIFICATION, CATARACT, WITH IOL INSERTION
Anesthesia: Monitor Anesthesia Care | Site: Eye | Laterality: Left

## 2018-11-21 MED ORDER — TETRACAINE HCL 0.5 % OP SOLN
OPHTHALMIC | Status: DC | PRN
  Administered 2018-11-21: 1 [drp] via OPHTHALMIC

## 2018-11-21 MED ORDER — MIDAZOLAM HCL 2 MG/2ML IJ SOLN
INTRAMUSCULAR | Status: DC | PRN
  Administered 2018-11-21 (×2): 1 mg via INTRAVENOUS

## 2018-11-21 MED ORDER — BRIMONIDINE TARTRATE-TIMOLOL 0.2-0.5 % OP SOLN
OPHTHALMIC | Status: DC | PRN
  Administered 2018-11-21: 1 [drp] via OPHTHALMIC

## 2018-11-21 MED ORDER — NA CHONDROIT SULF-NA HYALURON 40-17 MG/ML IO SOLN
INTRAOCULAR | Status: DC | PRN
  Administered 2018-11-21: 1 mL via INTRAOCULAR

## 2018-11-21 MED ORDER — MOXIFLOXACIN HCL 0.5 % OP SOLN
OPHTHALMIC | Status: DC | PRN
  Administered 2018-11-21: 0.2 mL via OPHTHALMIC

## 2018-11-21 MED ORDER — LACTATED RINGERS IV SOLN: INTRAVENOUS | Status: DC

## 2018-11-21 MED ORDER — ARMC OPHTHALMIC DILATING DROPS
1.0000 "application " | OPHTHALMIC | Status: DC | PRN
  Administered 2018-11-21 (×3): 1 via OPHTHALMIC

## 2018-11-21 MED ORDER — TETRACAINE HCL 0.5 % OP SOLN
1.0000 [drp] | OPHTHALMIC | Status: DC | PRN
  Administered 2018-11-21 (×3): 1 [drp] via OPHTHALMIC

## 2018-11-21 MED ORDER — ONDANSETRON HCL 4 MG/2ML IJ SOLN: 4.0000 mg | Freq: Once | INTRAMUSCULAR | Status: DC | PRN

## 2018-11-21 MED ORDER — FENTANYL CITRATE (PF) 100 MCG/2ML IJ SOLN
INTRAMUSCULAR | Status: DC | PRN
  Administered 2018-11-21: 50 ug via INTRAVENOUS

## 2018-11-21 MED ORDER — EPINEPHRINE PF 1 MG/ML IJ SOLN
INTRAOCULAR | Status: DC | PRN
  Administered 2018-11-21: 60 mL via OPHTHALMIC

## 2018-11-21 MED ORDER — LIDOCAINE HCL (PF) 2 % IJ SOLN
INTRAOCULAR | Status: DC | PRN
  Administered 2018-11-21: 1 mL

## 2018-11-21 SURGICAL SUPPLY — 22 items
CANNULA ANT/CHMB 27G (MISCELLANEOUS) ×1 IMPLANT
CANNULA ANT/CHMB 27GA (MISCELLANEOUS) ×3 IMPLANT
GLOVE BIOGEL PI IND STRL 7.0 (GLOVE) IMPLANT
GLOVE BIOGEL PI IND STRL 8 (GLOVE) IMPLANT
GLOVE BIOGEL PI INDICATOR 7.0 (GLOVE) ×2
GLOVE BIOGEL PI INDICATOR 8 (GLOVE) ×4
GOWN STRL REUS W/ TWL LRG LVL3 (GOWN DISPOSABLE) ×2 IMPLANT
GOWN STRL REUS W/TWL LRG LVL3 (GOWN DISPOSABLE) ×4
LENS IOL TECNIS ITEC 19.0 (Intraocular Lens) ×2 IMPLANT
MARKER SKIN DUAL TIP RULER LAB (MISCELLANEOUS) ×3 IMPLANT
NDL FILTER BLUNT 18X1 1/2 (NEEDLE) ×1 IMPLANT
NDL RETROBULBAR .5 NSTRL (NEEDLE) ×3 IMPLANT
NEEDLE FILTER BLUNT 18X 1/2SAF (NEEDLE) ×2
NEEDLE FILTER BLUNT 18X1 1/2 (NEEDLE) ×1 IMPLANT
PACK EYE AFTER SURG (MISCELLANEOUS) ×3 IMPLANT
PACK OPTHALMIC (MISCELLANEOUS) ×3 IMPLANT
PACK PORFILIO (MISCELLANEOUS) ×3 IMPLANT
SUT ETHILON 10-0 CS-B-6CS-B-6 (SUTURE)
SUTURE EHLN 10-0 CS-B-6CS-B-6 (SUTURE) IMPLANT
SYR 3ML LL SCALE MARK (SYRINGE) ×3 IMPLANT
SYR TB 1ML LUER SLIP (SYRINGE) ×3 IMPLANT
WIPE NON LINTING 3.25X3.25 (MISCELLANEOUS) ×3 IMPLANT

## 2018-11-21 NOTE — Anesthesia Postprocedure Evaluation (Signed)
Anesthesia Post Note  Patient: Kara [name]  Procedure(s) Performed: CATARACT EXTRACTION PHACO AND INTRAOCULAR LENS PLACEMENT (IOC) LEFT DIABETES (Left Eye)  Patient location during evaluation: PACU Anesthesia Type: MAC Level of consciousness: awake and alert Pain management: pain level controlled Vital Signs Assessment: post-procedure vital signs reviewed and stable Respiratory status: spontaneous breathing, nonlabored ventilation, respiratory function stable and patient connected to nasal cannula oxygen Cardiovascular status: stable and blood pressure returned to baseline Postop Assessment: no apparent nausea or vomiting Anesthetic complications: no    Veda Canning

## 2018-11-21 NOTE — Transfer of Care (Signed)
Immediate Anesthesia Transfer of Care Note  Patient: Kara [name]  Procedure(s) Performed: CATARACT EXTRACTION PHACO AND INTRAOCULAR LENS PLACEMENT (I[facility]) LEFT DIABETES (Left Eye)  Patient Location: PACU  Anesthesia Type: MAC  Level of Consciousness: awake, alert  and patient cooperative  Airway and Oxygen Therapy: Patient Spontanous Breathing and Patient connected to supplemental oxygen  Post-op Assessment: Post-op Vital signs reviewed, Patient's Cardiovascular Status Stable, Respiratory Function Stable, Patent Airway and No signs of Nausea or vomiting  Post-op Vital Signs: Reviewed and stable  Complications: No apparent anesthesia complications

## 2018-11-21 NOTE — Op Note (Signed)
PREOPERATIVE DIAGNOSIS:  Nuclear sclerotic cataract of the left eye.   POSTOPERATIVE DIAGNOSIS:  Nuclear sclerotic cataract of the left eye.   OPERATIVE PROCEDURE: Procedure(s): CATARACT EXTRACTION PHACO AND INTRAOCULAR LENS PLACEMENT ([place]) LEFT DIABETES   SURGEON:  Birder Robson, MD.   ANESTHESIA:  Anesthesiologist: Veda Canning, MD CRNA: Cameron Ali, CRNA  1.      Managed anesthesia care. 2.     0.79ml of Shugarcaine was instilled following the paracentesis   COMPLICATIONS:  None.   TECHNIQUE:   Stop and chop   DESCRIPTION OF PROCEDURE:  The patient was examined and consented in the preoperative holding area where the aforementioned topical anesthesia was applied to the left eye and then brought back to the Operating Room where the left eye was prepped and draped in the usual sterile ophthalmic fashion and a lid speculum was placed. A paracentesis was created with the side port blade and the anterior chamber was filled with viscoelastic. A near clear corneal incision was performed with the steel keratome. A continuous curvilinear capsulorrhexis was performed with a cystotome followed by the capsulorrhexis forceps. Hydrodissection and hydrodelineation were carried out with BSS on a blunt cannula. The lens was removed in a stop and chop  technique and the remaining cortical material was removed with the irrigation-aspiration handpiece. The capsular bag was inflated with viscoelastic and the Technis ZCB00 lens was placed in the capsular bag without complication. The remaining viscoelastic was removed from the eye with the irrigation-aspiration handpiece. The wounds were hydrated. The anterior chamber was flushed withBSS and the eye was inflated to physiologic pressure. 0.18ml Vigamox was placed in the anterior chamber. The wounds were found to be water tight. The eye was dressed with Combigan. The patient was given protective glasses to wear throughout the day and a shield with which to sleep  tonight. The patient was also given drops with which to begin a drop regimen today and will follow-up with me in one day. Implant Name Type Inv. Item Serial No. Manufacturer Lot No. LRB No. Used Action  LENS IOL DIOP 19.0 - [id number] Intraocular Lens LENS IOL DIOP 19.0 [id number] AMO  Left 1 Implanted    Procedure(s) with comments: CATARACT EXTRACTION PHACO AND INTRAOCULAR LENS PLACEMENT (IOC) LEFT DIABETES (Left) - latex sensitivity Diabetic - oral meds  Electronically signed: Birder Robson 11/21/2018 10:28 AM

## 2018-11-21 NOTE — H&P (Signed)
All labs reviewed. Abnormal studies sent to patients PCP when indicated.  Previous H&P reviewed, patient examined, there are NO CHANGES.  Kara [name] Porfilio6/30/20209:58 AM

## 2018-11-21 NOTE — Anesthesia Procedure Notes (Signed)
Procedure Name: MAC Date/Time: 11/21/2018 10:07 AM Performed by: Cameron Ali, CRNA Pre-anesthesia Checklist: Patient identified, Emergency Drugs available, Suction available, Timeout performed and Patient being monitored Patient Re-evaluated:Patient Re-evaluated prior to induction Oxygen Delivery Method: Nasal cannula Placement Confirmation: positive ETCO2

## 2018-11-21 NOTE — Anesthesia Preprocedure Evaluation (Signed)
Anesthesia Evaluation  Patient identified by MRN, date of birth, ID band Patient awake    Reviewed: Allergy & Precautions, NPO status , Patient's Chart, lab work & pertinent test results  Airway Mallampati: III  TM Distance: >3 FB     Dental  (+) Chipped, Partial Upper   Pulmonary           Cardiovascular hypertension, + angina +CHF       Neuro/Psych Vertigo  Neuromuscular disease (diabetic neuropathy)    GI/Hepatic   Endo/Other  diabetes, Type 2Morbid obesity  Renal/GU      Musculoskeletal  (+) Arthritis ,   Abdominal   Peds  Hematology   Anesthesia Other Findings Hx of PE. IVC removed.  Reproductive/Obstetrics                             Anesthesia Physical  Anesthesia Plan  ASA: III  Anesthesia Plan: MAC   Post-op Pain Management:    Induction:   PONV Risk Score and Plan:   Airway Management Planned:   Additional Equipment:   Intra-op Plan:   Post-operative Plan:   Informed Consent: I have reviewed the patients History and Physical, chart, labs and discussed the procedure including the risks, benefits and alternatives for the proposed anesthesia with the patient or authorized representative who has indicated his/her understanding and acceptance.       Plan Discussed with: CRNA  Anesthesia Plan Comments:         Anesthesia Quick Evaluation

## 2018-12-04 ENCOUNTER — Other Ambulatory Visit: Payer: Self-pay

## 2018-12-04 ENCOUNTER — Encounter: Payer: Self-pay | Admitting: *Deleted

## 2018-12-07 DIAGNOSIS — Z7901 Long term (current) use of anticoagulants: Secondary | ICD-10-CM | POA: Diagnosis not present

## 2018-12-08 ENCOUNTER — Other Ambulatory Visit
Admission: RE | Admit: 2018-12-08 | Discharge: 2018-12-08 | Disposition: A | Discharge status: Home / Self Care | Payer: PPO | Source: Ambulatory Visit | Attending: Ophthalmology | Admitting: Ophthalmology

## 2018-12-08 ENCOUNTER — Other Ambulatory Visit: Payer: Self-pay

## 2018-12-08 DIAGNOSIS — Z1159 Encounter for screening for other viral diseases: Secondary | ICD-10-CM | POA: Insufficient documentation

## 2018-12-08 DIAGNOSIS — E119 Type 2 diabetes mellitus without complications: Secondary | ICD-10-CM | POA: Diagnosis not present

## 2018-12-08 DIAGNOSIS — H2511 Age-related nuclear cataract, right eye: Secondary | ICD-10-CM | POA: Diagnosis not present

## 2018-12-08 LAB — SARS CORONAVIRUS 2 (TAT 6-24 HRS): SARS Coronavirus 2: NEGATIVE

## 2018-12-08 NOTE — Discharge Instructions (Signed)

## 2018-12-12 ENCOUNTER — Encounter
Admission: RE | Disposition: A | Discharge status: Home / Self Care | Payer: Self-pay | Source: Ambulatory Visit | Attending: Ophthalmology

## 2018-12-12 ENCOUNTER — Ambulatory Visit
Admission: RE | Admit: 2018-12-12 | Discharge: 2018-12-12 | Disposition: A | Discharge status: Home / Self Care | Payer: PPO | Source: Ambulatory Visit | Attending: Ophthalmology | Admitting: Ophthalmology

## 2018-12-12 ENCOUNTER — Ambulatory Visit: Payer: PPO | Admitting: Anesthesiology

## 2018-12-12 DIAGNOSIS — Z9049 Acquired absence of other specified parts of digestive tract: Secondary | ICD-10-CM | POA: Insufficient documentation

## 2018-12-12 DIAGNOSIS — Z7984 Long term (current) use of oral hypoglycemic drugs: Secondary | ICD-10-CM | POA: Diagnosis not present

## 2018-12-12 DIAGNOSIS — M1990 Unspecified osteoarthritis, unspecified site: Secondary | ICD-10-CM | POA: Insufficient documentation

## 2018-12-12 DIAGNOSIS — Z79899 Other long term (current) drug therapy: Secondary | ICD-10-CM | POA: Diagnosis not present

## 2018-12-12 DIAGNOSIS — Z6841 Body Mass Index (BMI) 40.0 and over, adult: Secondary | ICD-10-CM | POA: Insufficient documentation

## 2018-12-12 DIAGNOSIS — I509 Heart failure, unspecified: Secondary | ICD-10-CM | POA: Insufficient documentation

## 2018-12-12 DIAGNOSIS — H25811 Combined forms of age-related cataract, right eye: Secondary | ICD-10-CM | POA: Diagnosis not present

## 2018-12-12 DIAGNOSIS — M810 Age-related osteoporosis without current pathological fracture: Secondary | ICD-10-CM | POA: Diagnosis not present

## 2018-12-12 DIAGNOSIS — Z9849 Cataract extraction status, unspecified eye: Secondary | ICD-10-CM | POA: Diagnosis not present

## 2018-12-12 DIAGNOSIS — E1140 Type 2 diabetes mellitus with diabetic neuropathy, unspecified: Secondary | ICD-10-CM | POA: Insufficient documentation

## 2018-12-12 DIAGNOSIS — Z7901 Long term (current) use of anticoagulants: Secondary | ICD-10-CM | POA: Insufficient documentation

## 2018-12-12 DIAGNOSIS — H2511 Age-related nuclear cataract, right eye: Secondary | ICD-10-CM | POA: Diagnosis not present

## 2018-12-12 DIAGNOSIS — Z86711 Personal history of pulmonary embolism: Secondary | ICD-10-CM | POA: Insufficient documentation

## 2018-12-12 DIAGNOSIS — Z7982 Long term (current) use of aspirin: Secondary | ICD-10-CM | POA: Insufficient documentation

## 2018-12-12 DIAGNOSIS — Z91040 Latex allergy status: Secondary | ICD-10-CM | POA: Diagnosis not present

## 2018-12-12 DIAGNOSIS — I110 Hypertensive heart disease with heart failure: Secondary | ICD-10-CM | POA: Insufficient documentation

## 2018-12-12 DIAGNOSIS — E1136 Type 2 diabetes mellitus with diabetic cataract: Secondary | ICD-10-CM | POA: Diagnosis not present

## 2018-12-12 HISTORY — DX: Personal history of other diseases of the digestive system: Z87.19

## 2018-12-12 HISTORY — PX: CATARACT EXTRACTION W/PHACO: SHX586

## 2018-12-12 HISTORY — DX: Personal history of other medical treatment: Z92.89

## 2018-12-12 LAB — GLUCOSE, CAPILLARY
Glucose-Capillary: 174 mg/dL — ABNORMAL HIGH (ref 70–99)
Glucose-Capillary: 180 mg/dL — ABNORMAL HIGH (ref 70–99)

## 2018-12-12 SURGERY — PHACOEMULSIFICATION, CATARACT, WITH IOL INSERTION
Anesthesia: Monitor Anesthesia Care | Site: Eye | Laterality: Right

## 2018-12-12 MED ORDER — ACETAMINOPHEN 325 MG PO TABS: 325.0000 mg | ORAL_TABLET | ORAL | Status: DC | PRN

## 2018-12-12 MED ORDER — NA CHONDROIT SULF-NA HYALURON 40-17 MG/ML IO SOLN
INTRAOCULAR | Status: DC | PRN
  Administered 2018-12-12: 1 mL via INTRAOCULAR

## 2018-12-12 MED ORDER — LIDOCAINE HCL (PF) 2 % IJ SOLN
INTRAOCULAR | Status: DC | PRN
  Administered 2018-12-12: 2 mL

## 2018-12-12 MED ORDER — ACETAMINOPHEN 160 MG/5ML PO SOLN: 325.0000 mg | ORAL | Status: DC | PRN

## 2018-12-12 MED ORDER — BRIMONIDINE TARTRATE-TIMOLOL 0.2-0.5 % OP SOLN
OPHTHALMIC | Status: DC | PRN
  Administered 2018-12-12: 1 [drp] via OPHTHALMIC

## 2018-12-12 MED ORDER — TETRACAINE HCL 0.5 % OP SOLN
1.0000 [drp] | OPHTHALMIC | Status: DC | PRN
  Administered 2018-12-12 (×3): 1 [drp] via OPHTHALMIC

## 2018-12-12 MED ORDER — MIDAZOLAM HCL 2 MG/2ML IJ SOLN
INTRAMUSCULAR | Status: DC | PRN
  Administered 2018-12-12: 2 mg via INTRAVENOUS

## 2018-12-12 MED ORDER — EPINEPHRINE PF 1 MG/ML IJ SOLN
INTRAOCULAR | Status: DC | PRN
  Administered 2018-12-12: 57 mL via OPHTHALMIC

## 2018-12-12 MED ORDER — FENTANYL CITRATE (PF) 100 MCG/2ML IJ SOLN
INTRAMUSCULAR | Status: DC | PRN
  Administered 2018-12-12 (×2): 50 ug via INTRAVENOUS

## 2018-12-12 MED ORDER — ARMC OPHTHALMIC DILATING DROPS
1.0000 "application " | OPHTHALMIC | Status: DC | PRN
  Administered 2018-12-12 (×3): 1 via OPHTHALMIC

## 2018-12-12 MED ORDER — MOXIFLOXACIN HCL 0.5 % OP SOLN
OPHTHALMIC | Status: DC | PRN
  Administered 2018-12-12: 0.2 mL via OPHTHALMIC

## 2018-12-12 SURGICAL SUPPLY — 19 items

## 2018-12-12 NOTE — Op Note (Signed)
PREOPERATIVE DIAGNOSIS:  Nuclear sclerotic cataract of the right eye.   POSTOPERATIVE DIAGNOSIS:  H25.11 CATARACT   OPERATIVE PROCEDURE: Procedure(s): CATARACT EXTRACTION PHACO AND INTRAOCULAR LENS PLACEMENT (I[facility]) RIGHT DIABETES   SURGEON:  Birder Robson, MD.   ANESTHESIA:  Anesthesiologist: Alisa Graff, MD CRNA: Jeannene Patella, CRNA  1.      Managed anesthesia care. 2.      0.80ml of Shugarcaine was instilled in the eye following the paracentesis.   COMPLICATIONS:  None.   TECHNIQUE:   Stop and chop   DESCRIPTION OF PROCEDURE:  The patient was examined and consented in the preoperative holding area where the aforementioned topical anesthesia was applied to the right eye and then brought back to the Operating Room where the right eye was prepped and draped in the usual sterile ophthalmic fashion and a lid speculum was placed. A paracentesis was created with the side port blade and the anterior chamber was filled with viscoelastic. A near clear corneal incision was performed with the steel keratome. A continuous curvilinear capsulorrhexis was performed with a cystotome followed by the capsulorrhexis forceps. Hydrodissection and hydrodelineation were carried out with BSS on a blunt cannula. The lens was removed in a stop and chop  technique and the remaining cortical material was removed with the irrigation-aspiration handpiece. The capsular bag was inflated with viscoelastic and the Technis ZCB00  lens was placed in the capsular bag without complication. The remaining viscoelastic was removed from the eye with the irrigation-aspiration handpiece. The wounds were hydrated. The anterior chamber was flushed with BSS and the eye was inflated to physiologic pressure. 0.7ml of Vigamox was placed in the anterior chamber. The wounds were found to be water tight. The eye was dressed with Combigan. The patient was given protective glasses to wear throughout the day and a shield with which to  sleep tonight. The patient was also given drops with which to begin a drop regimen today and will follow-up with me in one day. Implant Name Type Inv. Item Serial No. Manufacturer Lot No. LRB No. Used Action  LENS IOL DIOP 20.0 - [id number] Intraocular Lens LENS IOL DIOP 20.0 [id number] AMO  Right 1 Implanted   Procedure(s) with comments: CATARACT EXTRACTION PHACO AND INTRAOCULAR LENS PLACEMENT ([facility]) RIGHT DIABETES (Right) - Diabetes-oral med Latex sensitiviy  Electronically signed: Birder Robson 12/12/2018 7:56 AM

## 2018-12-12 NOTE — Anesthesia Postprocedure Evaluation (Signed)
Anesthesia Post Note  Patient: Kara [name]  Procedure(s) Performed: CATARACT EXTRACTION PHACO AND INTRAOCULAR LENS PLACEMENT (I[facility]) RIGHT DIABETES (Right Eye)  Patient location during evaluation: PACU Anesthesia Type: MAC Level of consciousness: awake and alert Pain management: pain level controlled Vital Signs Assessment: post-procedure vital signs reviewed and stable Respiratory status: spontaneous breathing, nonlabored ventilation, respiratory function stable and patient connected to nasal cannula oxygen Cardiovascular status: stable and blood pressure returned to baseline Postop Assessment: no apparent nausea or vomiting Anesthetic complications: no    Alisa Graff

## 2018-12-12 NOTE — Anesthesia Preprocedure Evaluation (Addendum)
Anesthesia Evaluation  Patient identified by MRN, date of birth, ID band Patient awake    Reviewed: Allergy & Precautions, H&P , NPO status , Patient's Chart, lab work & pertinent test results, reviewed documented beta blocker date and time   Airway Mallampati: III  TM Distance: >3 FB Neck ROM: full    Dental no notable dental hx. (+) Chipped, Partial Upper   Pulmonary neg pulmonary ROS,    Pulmonary exam normal breath sounds clear to auscultation       Cardiovascular Exercise Tolerance: Good hypertension, + angina +CHF   Rhythm:regular Rate:Normal     Neuro/Psych Vertigo  Neuromuscular disease (diabetic neuropathy) negative psych ROS   GI/Hepatic negative GI ROS, Neg liver ROS,   Endo/Other  diabetes, Type 2Morbid obesity  Renal/GU negative Renal ROS  negative genitourinary   Musculoskeletal  (+) Arthritis ,   Abdominal   Peds  Hematology negative hematology ROS (+)   Anesthesia Other Findings Hx of PE. IVC removed.  Reproductive/Obstetrics negative OB ROS                            Anesthesia Physical  Anesthesia Plan  ASA: III  Anesthesia Plan: MAC   Post-op Pain Management:    Induction:   PONV Risk Score and Plan:   Airway Management Planned:   Additional Equipment:   Intra-op Plan:   Post-operative Plan:   Informed Consent: I have reviewed the patients History and Physical, chart, labs and discussed the procedure including the risks, benefits and alternatives for the proposed anesthesia with the patient or authorized representative who has indicated his/her understanding and acceptance.     Dental Advisory Given  Plan Discussed with: CRNA  Anesthesia Plan Comments:        Anesthesia Quick Evaluation

## 2018-12-12 NOTE — Transfer of Care (Signed)
Immediate Anesthesia Transfer of Care Note  Patient: Kara [name]  Procedure(s) Performed: CATARACT EXTRACTION PHACO AND INTRAOCULAR LENS PLACEMENT ([facility]) RIGHT DIABETES (Right Eye)  Patient Location: PACU  Anesthesia Type: MAC  Level of Consciousness: awake, alert  and patient cooperative  Airway and Oxygen Therapy: Patient Spontanous Breathing and Patient connected to supplemental oxygen  Post-op Assessment: Post-op Vital signs reviewed, Patient's Cardiovascular Status Stable, Respiratory Function Stable, Patent Airway and No signs of Nausea or vomiting  Post-op Vital Signs: Reviewed and stable  Complications: No apparent anesthesia complications

## 2018-12-12 NOTE — H&P (Signed)
All labs reviewed. Abnormal studies sent to patients PCP when indicated.  Previous H&P reviewed, patient examined, there are NO CHANGES.  Madelaine Whipple Porfilio7/21/20207:26 AM

## 2018-12-13 ENCOUNTER — Encounter: Payer: Self-pay | Admitting: Ophthalmology

## 2018-12-23 ENCOUNTER — Other Ambulatory Visit: Payer: Self-pay

## 2018-12-23 ENCOUNTER — Emergency Department
Admission: EM | Admit: 2018-12-23 | Discharge: 2018-12-23 | Disposition: A | Discharge status: Home / Self Care | Payer: PPO | Attending: Emergency Medicine | Admitting: Emergency Medicine

## 2018-12-23 ENCOUNTER — Emergency Department: Payer: PPO

## 2018-12-23 DIAGNOSIS — Y939 Activity, unspecified: Secondary | ICD-10-CM | POA: Diagnosis not present

## 2018-12-23 DIAGNOSIS — Z7984 Long term (current) use of oral hypoglycemic drugs: Secondary | ICD-10-CM | POA: Diagnosis not present

## 2018-12-23 DIAGNOSIS — Z79899 Other long term (current) drug therapy: Secondary | ICD-10-CM | POA: Diagnosis not present

## 2018-12-23 DIAGNOSIS — Z7901 Long term (current) use of anticoagulants: Secondary | ICD-10-CM | POA: Diagnosis not present

## 2018-12-23 DIAGNOSIS — Z7982 Long term (current) use of aspirin: Secondary | ICD-10-CM | POA: Diagnosis not present

## 2018-12-23 DIAGNOSIS — E119 Type 2 diabetes mellitus without complications: Secondary | ICD-10-CM | POA: Insufficient documentation

## 2018-12-23 DIAGNOSIS — I5022 Chronic systolic (congestive) heart failure: Secondary | ICD-10-CM | POA: Diagnosis not present

## 2018-12-23 DIAGNOSIS — W109XXA Fall (on) (from) unspecified stairs and steps, initial encounter: Secondary | ICD-10-CM | POA: Diagnosis not present

## 2018-12-23 DIAGNOSIS — Z91040 Latex allergy status: Secondary | ICD-10-CM | POA: Diagnosis not present

## 2018-12-23 DIAGNOSIS — S8012XA Contusion of left lower leg, initial encounter: Secondary | ICD-10-CM | POA: Diagnosis not present

## 2018-12-23 DIAGNOSIS — Y999 Unspecified external cause status: Secondary | ICD-10-CM | POA: Diagnosis not present

## 2018-12-23 DIAGNOSIS — M79605 Pain in left leg: Secondary | ICD-10-CM

## 2018-12-23 DIAGNOSIS — L02415 Cutaneous abscess of right lower limb: Secondary | ICD-10-CM | POA: Insufficient documentation

## 2018-12-23 DIAGNOSIS — S8990XA Unspecified injury of unspecified lower leg, initial encounter: Secondary | ICD-10-CM | POA: Diagnosis present

## 2018-12-23 DIAGNOSIS — Y929 Unspecified place or not applicable: Secondary | ICD-10-CM | POA: Insufficient documentation

## 2018-12-23 DIAGNOSIS — S8011XA Contusion of right lower leg, initial encounter: Secondary | ICD-10-CM | POA: Insufficient documentation

## 2018-12-23 DIAGNOSIS — I110 Hypertensive heart disease with heart failure: Secondary | ICD-10-CM | POA: Insufficient documentation

## 2018-12-23 DIAGNOSIS — M79604 Pain in right leg: Secondary | ICD-10-CM

## 2018-12-23 LAB — PROTIME-INR
INR: 3.4 — ABNORMAL HIGH (ref 0.8–1.2)
Prothrombin Time: 33.5 seconds — ABNORMAL HIGH (ref 11.4–15.2)

## 2018-12-23 LAB — CBC WITH DIFFERENTIAL/PLATELET
Abs Immature Granulocytes: 0.02 10*3/uL (ref 0.00–0.07)
Basophils Absolute: 0 10*3/uL (ref 0.0–0.1)
Basophils Relative: 0 %
Eosinophils Absolute: 0.1 10*3/uL (ref 0.0–0.5)
Eosinophils Relative: 1 %
HCT: 36.4 % (ref 36.0–46.0)
Hemoglobin: 11.6 g/dL — ABNORMAL LOW (ref 12.0–15.0)
Immature Granulocytes: 0 %
Lymphocytes Relative: 23 %
Lymphs Abs: 1.2 10*3/uL (ref 0.7–4.0)
MCH: 27.9 pg (ref 26.0–34.0)
MCHC: 31.9 g/dL (ref 30.0–36.0)
MCV: 87.5 fL (ref 80.0–100.0)
Monocytes Absolute: 0.5 10*3/uL (ref 0.1–1.0)
Monocytes Relative: 10 %
Neutro Abs: 3.4 10*3/uL (ref 1.7–7.7)
Neutrophils Relative %: 66 %
Platelets: 212 10*3/uL (ref 150–400)
RBC: 4.16 MIL/uL (ref 3.87–5.11)
RDW: 15 % (ref 11.5–15.5)
WBC: 5.3 10*3/uL (ref 4.0–10.5)
nRBC: 0 % (ref 0.0–0.2)

## 2018-12-23 LAB — COMPREHENSIVE METABOLIC PANEL
ALT: 36 U/L (ref 0–44)
AST: 30 U/L (ref 15–41)
Albumin: 3.5 g/dL (ref 3.5–5.0)
Alkaline Phosphatase: 67 U/L (ref 38–126)
Anion gap: 8 (ref 5–15)
BUN: 12 mg/dL (ref 8–23)
CO2: 23 mmol/L (ref 22–32)
Calcium: 8.5 mg/dL — ABNORMAL LOW (ref 8.9–10.3)
Chloride: 107 mmol/L (ref 98–111)
Creatinine, Ser: 0.81 mg/dL (ref 0.44–1.00)
GFR calc Af Amer: >60 mL/min (ref >60)
GFR calc non Af Amer: >60 mL/min (ref >60)
Glucose, Bld: 301 mg/dL — ABNORMAL HIGH (ref 70–99)
Potassium: 3.8 mmol/L (ref 3.5–5.1)
Sodium: 138 mmol/L (ref 135–145)
Total Bilirubin: 0.7 mg/dL (ref 0.3–1.2)
Total Protein: 6.5 g/dL (ref 6.5–8.1)

## 2018-12-23 MED ORDER — LIDOCAINE-EPINEPHRINE (PF) 2 %-1:200000 IJ SOLN
10.0000 mL | Freq: Once | INTRAMUSCULAR | Status: AC
  Administered 2018-12-23: 10 mL via INTRADERMAL
  Filled 2018-12-23: qty 10

## 2018-12-23 NOTE — ED Notes (Signed)
See triage note  States she fell from her porch several days ago  And then states she was helping a client get into the tub  She slipped and scrapped her left lower leg  Developed a "knot" to both lower legs  Bruising noted to both legs with swelling

## 2018-12-23 NOTE — ED Triage Notes (Addendum)
Pt arrived via POV with reports of fall last Saturday, pt states a few days ago she was helping an elderly patient and scraped the front of her shin on the left side.  Pt also has what she describes as a large "knot" on the right lower leg.  Bruising noted to lower extremities.  Pt is on warfarin. Pt states she lost her balance while doing housework last week.

## 2018-12-23 NOTE — ED Provider Notes (Signed)
[facility] Emergency Department Provider Note ____________________________________________   First MD Initiated Contact with Patient 12/23/18 1239     (approximate)  I have reviewed the triage vital signs and the nursing notes.   HISTORY  Chief Complaint Fall and Leg Pain  HPI Kara [name] is a 68 y.o. female who presents to the emergency department for treatment and evaluation of bilateral lower extremity pain and swelling.  Patient states that about 5 days ago she fell down 3 porch steps while trying to carry some jars to her shed.  She denies striking her head or experiencing any loss of consciousness.  She states that since the fall, she has had bruising and swelling to both lower extremities that has been gradually increasing.  She then states that a couple of days ago she was helping an elderly person out of a bathtub and that person accidentally kicked her left shin.  She states that this caused more bruising and pain.  She states that an area over the left shin "drained blood" yesterday.  She is on 7 mg of Coumadin per day.  Her next INR check is scheduled for sometime this coming week.         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   Hypertension    Osteoporosis    PE (pulmonary thromboembolism) ([place])    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[facility])    Vertigo     Patient Active Problem List   Diagnosis Date Noted   Preoperative testing 10/23/2018   Small bowel obstruction due to adhesions ([place]) 08/24/2018   Coccygodynia 05/09/2018   Acute deep vein thrombosis  (DVT) of distal end of right lower extremity ([place]) 05/08/2018   DDD (degenerative disc disease), lumbosacral 04/11/2018   Chronic anticoagulation (COUMADIN) 04/11/2018   Morbid obesity with BMI of 40.0-44.9, adult ([place]) 03/15/2018   Secondary osteoarthritis of multiple sites 03/15/2018   Sacral insufficiency fracture 03/15/2018   Chronic low back pain (Primary Area of Pain) (Bilateral) 12/14/2017   Spondylosis without myelopathy or radiculopathy, lumbosacral region 12/14/2017   Other specified dorsopathies, sacral and sacrococcygeal region 12/14/2017   History of allergy to latex 11/09/2017   Hyponatremia 11/02/2017   Hypocalcemia 11/02/2017   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-S1 anterolisthesis. 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) 11/02/2017   Chronic lower extremity pain (Secondary Area of Pain) (Bilateral) 11/02/2017   Chronic radicular pain of lower extremity 11/02/2017   Osteoarthritis of hip (Right) 11/02/2017   Chronic low back pain (Primary Area of Pain) (Bilateral) w/ sciatica (Bilateral) 10/13/2017   Chronic sacroiliac joint pain (Right) 10/13/2017   Chronic pain syndrome 10/13/2017   Long  term current use of opiate analgesic 10/13/2017   Pharmacologic therapy 10/13/2017   Disorder of skeletal system 10/13/2017   Problems influencing health status 10/13/2017   Diverticulosis of colon 06/20/2017   History of pulmonary embolism 06/20/2017   S/P IVC filter 06/20/2017   H/O ileostomy 06/01/2017   Positive result for methicillin resistant  Staphylococcus aureus (MRSA) screening 05/11/2017   Ileostomy in place [facility]) 04/08/2017   History of colostomy reversal 02/24/2017   Other pulmonary embolism without acute cor pulmonale ([place]) 11/23/2016   Acute on chronic systolic (congestive) heart failure ([place]) 11/18/2016   HCAP (healthcare-associated pneumonia) 11/18/2016   Encounter for removal of staples    Edema of both legs 11/15/2016   Diverticulitis 10/03/2016   Diverticulitis large intestine 09/29/2016   Benign essential hypertension 08/26/2016   DDD (degenerative disc disease), lumbar 07/14/2016   Acute non-recurrent maxillary sinusitis 05/21/2016   Chronic hip pain (Left) 02/23/2016   Cough 07/30/2015   Nocturnal leg cramps 02/20/2015   Diabetes mellitus type 2 in obese ([place]) 11/12/2014   Type 2 diabetes mellitus without complication, without long-term current use of insulin ([place]) 11/12/2014   BP (high blood pressure) 11/12/2014    Past Surgical History:  Procedure Laterality Date   APPENDECTOMY N/A 02/24/2017   Procedure: Incidental  APPENDECTOMY;  Surgeon: [name], MD;  Location: [name] ORS;  Service: General;  Laterality: N/A;   CARPAL TUNNEL RELEASE Bilateral    CATARACT EXTRACTION W/PHACO Left 11/21/2018   Procedure: CATARACT EXTRACTION PHACO AND INTRAOCULAR LENS PLACEMENT ([place]) LEFT DIABETES;  Surgeon: Birder Robson, MD;  Location: [place];  Service: Ophthalmology;  Laterality: Left;  latex sensitivity Diabetic - oral meds   CATARACT EXTRACTION W/PHACO Right 12/12/2018   Procedure: CATARACT EXTRACTION PHACO AND INTRAOCULAR LENS PLACEMENT ([place]) RIGHT DIABETES;  Surgeon: Birder Robson, MD;  Location: [place];  Service: Ophthalmology;  Laterality: Right;  Diabetes-oral med Latex sensitiviy   COLON RESECTION SIGMOID N/A 11/02/2016   Procedure: COLON RESECTION SIGMOID;  Surgeon: [name], MD;  Location: [name];  Service: General;  Laterality: N/A;    COLON SURGERY  11/02/2016   COLONOSCOPY WITH PROPOFOL N/A 02/03/2017   Procedure: COLONOSCOPY WITH PROPOFOL;  Surgeon: Lin Landsman, MD;  Location: [facility] ENDOSCOPY;  Service: Gastroenterology;  Laterality: N/A;   COLOSTOMY N/A 11/02/2016   Procedure: COLOSTOMY;  Surgeon: [name], MD;  Location: [name];  Service: General;  Laterality: N/A;   COLOSTOMY REVERSAL N/A 02/24/2017   Procedure: COLOSTOMY REVERSAL, ostomy takedown, spleenic flexure mobilization, excision rectal stump/distal sigmoid, anastomosis with suture reinforcement;  Surgeon: [name], MD;  Location: [name];  Service: General;  Laterality: N/A;   ILEO LOOP DIVERSION N/A 02/24/2017   Procedure: ILEO LOOP COLOSTOMY;  Surgeon: [name], MD;  Location: [name];  Service: General;  Laterality: N/A;   ILEOSTOMY CLOSURE N/A 06/01/2017   Procedure: LOOP ILEOSTOMY TAKEDOWN;  Surgeon: [name], MD;  Location: [name];  Service: General;  Laterality: N/A;   IVC FILTER INSERTION Right 10/2016   IVC FILTER REMOVAL N/A 08/09/2017   Procedure: IVC FILTER REMOVAL;  Surgeon: Katha Cabal, MD;  Location: [place] [facility];  Service: Cardiovascular;  Laterality: N/A;   KNEE SURGERY     torn menicus   LAPAROSCOPY N/A 02/24/2017   Procedure: LAPAROSCOPY DIAGNOSTIC;  Surgeon: [name], MD;  Location: [name];  Service: General;  Laterality: N/A;   LYSIS OF ADHESION N/A 02/24/2017   Procedure: LYSIS OF ADHESION;  Surgeon: [name],  MD;  Location: [name];  Service: General;  Laterality: N/A;   PULMONARY VENOGRAPHY N/A 11/19/2016   Procedure: Pulmonary Venography; IVC filter placement; possible pulmonary thrombectomy;  Surgeon: Katha Cabal, MD;  Location: [place] [facility];  Service: Cardiovascular;  Laterality: N/A;   SACROPLASTY N/A 11/08/2017   Procedure: SACROPLASTY S2;  Surgeon: Hessie Knows, MD;  Location: [name];  Service: Orthopedics;  Laterality: N/A;    SIGMOIDOSCOPY N/A 11/13/2016   Procedure: endoscopic  flexible SIGMOIDOSCOPY;  Surgeon: Florene Glen, MD;  Location: [name];  Service: General;  Laterality: N/A;   SIGMOIDOSCOPY N/A 05/19/2017   Procedure: Beryle Quant;  Surgeon: [name], MD;  Location: [name];  Service: General;  Laterality: N/A;    Prior to Admission medications   Medication Sig Start Date End Date Taking? Authorizing Provider  aspirin EC 81 MG tablet Take 81 mg by mouth daily. am    [provider]  Calcium Polycarbophil (FIBER-CAPS PO) Take by mouth. am    [provider]  candesartan (ATACAND) 16 MG tablet Take 16 mg by mouth daily. am 08/03/17   [provider]  cyclobenzaprine (FLEXERIL) 10 MG tablet Take 10 mg by mouth at bedtime.  07/05/16   [provider]  gabapentin (NEURONTIN) 100 MG capsule Take 300 mg by mouth 3 (three) times daily. Am,dinner,bedtime    [provider]  glipiZIDE (GLUCOTROL XL) 5 MG 24 hr tablet Take 10 mg by mouth daily with breakfast.  10/27/16   [provider]  meclizine (ANTIVERT) 25 MG tablet Take 25 mg by mouth daily. am 12/29/17   [provider]  Multiple Vitamin (MULTI-VITAMINS) TABS Take 1 tablet by mouth daily.    [provider]  rOPINIRole (REQUIP) 0.5 MG tablet Take 0.5 mg by mouth at bedtime.    [provider]  warfarin (COUMADIN) 1 MG tablet Take 1 mg by mouth. am 10/17/18   [provider]  warfarin (COUMADIN) 6 MG tablet Take 6 mg by mouth daily. During the week and 7 mg on weekends    [provider]    Allergies Adhesive [tape], Other, Latex, and Morphine and related  Family History  Problem Relation Age of Onset   Diabetes Mother    Heart disease Mother    Cancer Mother    Breast cancer Mother    Heart disease Father    Diabetes Sister    Heart disease Sister    Breast cancer Cousin     Social History Social History   Tobacco Use     Smoking status: Never Smoker   Smokeless tobacco: Never Used  Substance Use Topics   Alcohol use: No    Alcohol/week: 0.0 standard drinks   Drug use: No    Review of Systems  Constitutional: No fever/chills Eyes: No visual changes. ENT: No sore throat. Cardiovascular: Denies chest pain. Respiratory: Denies shortness of breath. Gastrointestinal: No abdominal pain.  No nausea, no vomiting.  No diarrhea.  No constipation. Genitourinary: Negative for dysuria. Musculoskeletal: Positive for bilateral lower extremity pain.. Skin: Negative for rash. Neurological: Negative for headaches, focal weakness or numbness. ____________________________________________   PHYSICAL EXAM:  VITAL SIGNS: ED Triage Vitals  Enc Vitals Group     BP 12/23/18 1231 (!) 146/69     Pulse Rate 12/23/18 1231 98     Resp 12/23/18 1231 18     Temp 12/23/18 1231 98.5 F (36.9 C)     Temp Source 12/23/18 1231 Oral  SpO2 12/23/18 1231 94 %     Weight --      Height --      Head Circumference --      Peak Flow --      Pain Score 12/23/18 1232 10     Pain Loc --      Pain Edu? --      Excl. in [place]? --     Constitutional: Alert and oriented. Well appearing and in no acute distress. Eyes: Conjunctivae are normal. PERRL. EOMI. Head: Atraumatic. Nose: No congestion/rhinnorhea. Mouth/Throat: Mucous membranes are moist.  Oropharynx non-erythematous. Neck: No stridor.   Hematological/Lymphatic/Immunilogical: No cervical lymphadenopathy. Cardiovascular: Normal rate, regular rhythm. Grossly normal heart sounds.  Good peripheral circulation. Respiratory: Normal respiratory effort.  No retractions. Lungs CTAB. Gastrointestinal: Soft and nontender. No distention. No abdominal bruits. No CVA tenderness. Genitourinary:  Musculoskeletal: No lower extremity tenderness nor edema.  No joint effusions. Neurologic:  Normal speech and language. No gross focal neurologic deficits are appreciated. No gait  instability. Skin: Diffuse ecchymosis to bilateral lower extremities below the knees.  Left pretibial area has a skin tear type wound that measures approximately 3 and half to 4 cm.  There is no active bleeding.  Right lateral mid calf has a tender, raised, fluctuant area.   Psychiatric: Mood and affect are normal. Speech and behavior are normal.  ____________________________________________   LABS (all labs ordered are listed, but only abnormal results are displayed)  Labs Reviewed  CBC WITH DIFFERENTIAL/PLATELET - Abnormal; Notable for the following components:      Result Value   Hemoglobin 11.6 (*)    All other components within normal limits  COMPREHENSIVE METABOLIC PANEL - Abnormal; Notable for the following components:   Glucose, Bld 301 (*)    Calcium 8.5 (*)    All other components within normal limits  PROTIME-INR - Abnormal; Notable for the following components:   Prothrombin Time 33.5 (*)    INR 3.4 (*)    All other components within normal limits   ____________________________________________  EKG  Not indicated ____________________________________________  RADIOLOGY  ED MD interpretation:    Bilateral tib-fib images are negative for acute bony abnormality. Official radiology report(s): Dg Tibia/fibula Left  Result Date: 12/23/2018 CLINICAL DATA:  Bruises bilateral lower legs post fall. EXAM: LEFT TIBIA AND FIBULA - 2 VIEW COMPARISON:  None. FINDINGS: There is no evidence of fracture or dislocation. Soft tissues are unremarkable. IMPRESSION: No acute fracture or dislocation. Electronically Signed   By: Abelardo Diesel M.D.   On: 12/23/2018 13:51   Dg Tibia/fibula Right  Result Date: 12/23/2018 CLINICAL DATA:  Status post fall with bruises in the lower legs EXAM: RIGHT TIBIA AND FIBULA - 2 VIEW COMPARISON:  None. FINDINGS: There is no evidence of fracture or dislocation. Tibial spine osteophytosis is noted. Soft tissues are unremarkable. IMPRESSION: No acute fracture  or dislocation. Electronically Signed   By: Abelardo Diesel M.D.   On: 12/23/2018 13:50    ____________________________________________   PROCEDURES  Procedure(s) performed (including Critical Care):  Marland KitchenMarland KitchenIncision and Drainage  Date/Time: 12/23/2018 6:25 PM Performed by: Victorino Dike, FNP Authorized by: Victorino Dike, FNP   Consent:    Consent obtained:  Verbal   Consent given by:  Patient   Risks discussed:  Bleeding, infection, incomplete drainage and pain   Alternatives discussed:  Alternative treatment, delayed treatment and observation Location:    Type:  Seroma   Location:  Lower extremity   Lower extremity location:  Leg   Leg location:  R lower leg Pre-procedure details:    Skin preparation:  Betadine Anesthesia (see MAR for exact dosages):    Anesthesia method:  Local infiltration   Local anesthetic:  Lidocaine 2% WITH epi Procedure type:    Complexity:  Simple Procedure details:    Needle aspiration: yes     Needle size:  18 G   Incision types:  Stab incision   Incision depth:  Dermal   Scalpel blade:  11   Drainage:  Bloody and serosanguinous   Drainage amount:  Moderate   Wound treatment:  Wound left open   Packing materials:  None Post-procedure details:    Patient tolerance of procedure:  Tolerated well, no immediate complications    ____________________________________________   INITIAL IMPRESSION / ASSESSMENT AND PLAN     68 year old female presenting to the emergency department after sustaining a mechanical, non-syncopal fall 1 week ago then sustaining a separate injury to the left lower extremity a couple of days ago.  See HPI for further details.  Initial plan will be to get x-rays of the lower extremities and draw some labs that will include PT/INR.  ED COURSE  No fracture identified on either lower extremity.  Extremities are ecchymotic but do not appear to have any component of infection.  I doubt cellulitis is currently present.  The  right lateral calf does have an area of fluctuance.  Patient requested that the area be drained.  We did discuss risk factors as she is diabetic and she is on blood thinner with a slightly elevated INR.  Despite the risks, she requested that the area be opened.  Very small stab incision was made in the area of fluctuance and allowed to drain.  There is no de-loculation or probing.  No iodoform gauze was inserted.  I did get moderate amount of serosanguineous/bloody fluid which did relieve quite a bit of the swelling in that area.  Patient will decrease her Coumadin to 5 mg/day for the next 2 days.  She does not remember the date of her next INR check but thinks it is this coming week.  She was encouraged to call to clarify the appointment and if it is not this week, she needs to request to have it checked.  She was also encouraged to follow-up with her primary care provider for reevaluation of the ecchymosis to the lower extremities.  If she is unable to schedule appointment, she was advised that she should return to the emergency department. ____________________________________________   FINAL CLINICAL IMPRESSION(S) / ED DIAGNOSES  Final diagnoses:  Lower extremity pain, diffuse, left  Lower extremity pain, diffuse, right  Hematoma of right lower extremity, initial encounter  Hematoma of left lower extremity, initial encounter     ED Discharge Orders    None       Note:  This document was prepared using Dragon voice recognition software and may include unintentional dictation errors.   Victorino Dike, FNP 12/23/18 1836    Nena Polio, MD 12/23/18 2142

## 2018-12-23 NOTE — Discharge Instructions (Signed)
Decrease your Warfarin to 5mg  for the next 2 days.  See primary care for your INR check this week.  Return to the ER for symptoms that change or worsen or for new concerns if unable to schedule an appointment.

## 2018-12-23 NOTE — ED Notes (Signed)
First Nurse Note: Pt to ED via POV, pt states that she fell 1 week ago and has bruising on both legs. Pt states that she is on coumadin. Pt denies hitting her head.

## 2018-12-25 NOTE — Patient Instructions (Addendum)
____________________________________________________________________________________________  Post-Procedure Discharge Instructions  Instructions:  Apply ice:   Purpose: This will minimize any swelling and discomfort after procedure.   When: Day of procedure, as soon as you get home.  How: Fill a plastic sandwich bag with crushed ice. Cover it with a small towel and apply to injection site.  How long: (15 min on, 15 min off) Apply for 15 minutes then remove x 15 minutes.  Repeat sequence on day of procedure, until you go to bed.  Apply heat:   Purpose: To treat any soreness and discomfort from the procedure.  When: Starting the next day after the procedure.  How: Apply heat to procedure site starting the day following the procedure.  How long: May continue to repeat daily, until discomfort goes away.  Food intake: Start with clear liquids (like water) and advance to regular food, as tolerated.   Physical activities: Keep activities to a minimum for the first 8 hours after the procedure. After that, then as tolerated.  Driving: If you have received any sedation, be responsible and do not drive. You are not allowed to drive for 24 hours after having sedation.  Blood thinner: (Applies only to those taking blood thinners) You may restart your blood thinner 6 hours after your procedure.  Insulin: (Applies only to Diabetic patients taking insulin) As soon as you can eat, you may resume your normal dosing schedule.  Infection prevention: Keep procedure site clean and dry. Shower daily and clean area with soap and water.  Post-procedure Pain Diary: Extremely important that this be done correctly and accurately. Recorded information will be used to determine the next step in treatment. For the purpose of accuracy, follow these rules:  Evaluate only the area treated. Do not report or include pain from an untreated area. For the purpose of this evaluation, ignore all other areas of pain,  except for the treated area.  After your procedure, avoid taking a long nap and attempting to complete the pain diary after you wake up. Instead, set your alarm clock to go off every hour, on the hour, for the initial 8 hours after the procedure. Document the duration of the numbing medicine, and the relief you are getting from it.  Do not go to sleep and attempt to complete it later. It will not be accurate. If you received sedation, it is likely that you were given a medication that may cause amnesia. Because of this, completing the diary at a later time may cause the information to be inaccurate. This information is needed to plan your care.  Follow-up appointment: Keep your post-procedure follow-up evaluation appointment after the procedure (usually 2 weeks for most procedures, 6 weeks for radiofrequencies). DO NOT FORGET to bring you pain diary with you.   Expect: (What should I expect to see with my procedure?)  From numbing medicine (AKA: Local Anesthetics): Numbness or decrease in pain. You may also experience some weakness, which if present, could last for the duration of the local anesthetic.  Onset: Full effect within 15 minutes of injected.  Duration: It will depend on the type of local anesthetic used. On the average, 1 to 8 hours.   From steroids (Applies only if steroids were used): Decrease in swelling or inflammation. Once inflammation is improved, relief of the pain will follow.  Onset of benefits: Depends on the amount of swelling present. The more swelling, the longer it will take for the benefits to be seen. In some cases, up to 10 days.  Duration: Steroids will stay in the system x 2 weeks. Duration of benefits will depend on multiple posibilities including persistent irritating factors.  Side-effects: If present, they may typically last 2 weeks (the duration of the steroids).  Frequent: Cramps (if they occur, drink Gatorade and take over-the-counter Magnesium 450-500 mg  once to twice a day); water retention with temporary weight gain; increases in blood sugar; decreased immune system response; increased appetite.  Occasional: Facial flushing (red, warm cheeks); mood swings; menstrual changes.  Uncommon: Long-term decrease or suppression of natural hormones; bone thinning. (These are more common with higher doses or more frequent use. This is why we prefer that our patients avoid having any injection therapies in other practices.)   Very Rare: Severe mood changes; psychosis; aseptic necrosis.  From procedure: Some discomfort is to be expected once the numbing medicine wears off. This should be minimal if ice and heat are applied as instructed.  Call if: (When should I call?)  You experience numbness and weakness that gets worse with time, as opposed to wearing off.  New onset bowel or bladder incontinence. (Applies only to procedures done in the spine)  Emergency Numbers:  Durning business hours (Monday - Thursday, 8:00 AM - 4:00 PM) (Friday, 9:00 AM - 12:00 Noon): [phone]  After hours: [phone]  NOTE: If you are having a problem and are unable connect with, or to talk to a provider, then go to your nearest urgent care or emergency department. If the problem is serious and urgent, please call 911. ____________________________________________________________________________________________   Pain Management Discharge Instructions  General Discharge Instructions :  If you need to reach your doctor call: Monday-Friday 8:00 am - 4:00 pm at [phone] or toll free [phone].  After clinic hours [phone] to have operator reach doctor.  Bring all of your medication bottles to all your appointments in the pain clinic.  To cancel or reschedule your appointment with Pain Management please remember to call 24 hours in advance to avoid a fee.  Refer to the educational materials which you have been given on: General Risks, I had my  Procedure. Discharge Instructions, Post Sedation.  Post Procedure Instructions:  The drugs you were given will stay in your system until tomorrow, so for the next 24 hours you should not drive, make any legal decisions or drink any alcoholic beverages.  You may eat anything you prefer, but it is better to start with liquids then soups and crackers, and gradually work up to solid foods.  Please notify your doctor immediately if you have any unusual bleeding, trouble breathing or pain that is not related to your normal pain.  Depending on the type of procedure that was done, some parts of your body may feel week and/or numb.  This usually clears up by tonight or the next day.  Walk with the use of an assistive device or accompanied by an adult for the 24 hours.  You may use ice on the affected area for the first 24 hours.  Put ice in a Ziploc bag and cover with a towel and place against area 15 minutes on 15 minutes off.  You may switch to heat after 24 hours.

## 2018-12-25 NOTE — Progress Notes (Signed)
Patient's Name: Kara [name]  MRN: [id number]  Referring Provider: Tracie Harrier, MD  DOB: March 15, 1951  PCP: Tracie Harrier, MD  DOS: 12/26/2018  Note by: Gaspar Cola, MD  Service setting: Ambulatory outpatient  Specialty: Interventional Pain Management  Patient type: Established  Location: [name] (AMB) Pain Management Facility  Visit type: Interventional Procedure   Primary Reason for Visit: Interventional Pain Management Treatment. CC: Back Pain (bilateral )  Procedure:          Anesthesia, Analgesia, Anxiolysis:  Procedure #1: Type: Medial Branch Facet Block #7 Primary Purpose: Diagnostic Region: Lumbar Level: L2, L3, L4, L5, & S1 Medial Branch Level(s) Target Area: For Lumbar Facet blocks, the target is the groove formed by the junction of the transverse process and superior articular process. For the L5 dorsal ramus, the target is the notch between superior articular process and sacral ala. For the S1 dorsal ramus, the target is the superior and lateral edge of the posterior S1 Sacral foramen. Approach: Posterior, paramedial, percutaneous approach. Laterality: Right  Procedure #2: Type: Sacroiliac Joint Block  #7  Primary Purpose: Diagnostic Region: Posterior Lumbosacral Level: PSIS (Posterior Superior Iliac Spine) Sacroiliac Joint Target Area: For upper sacroiliac joint block(s), the target is the superior and posterior margin of the sacroiliac joint. Approach: Ipsilateral approach. Laterality: Right  Type: Local Anesthesia Indication(s): Analgesia         Route: Infiltration ([place]/IM) IV Access: Declined Sedation: Declined  Local Anesthetic: Lidocaine 1-2%  Position: Prone   Indications: 1. Lumbar facet syndrome (Bilateral)   2. Spondylosis without myelopathy or radiculopathy, lumbosacral region   3. Chronic sacroiliac joint pain (Right)   4. Other specified dorsopathies, sacral and sacrococcygeal region   5. 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-S1  anterolisthesis.   6. DDD (degenerative disc disease), lumbosacral   7. Chronic low back pain (Primary Area of Pain) (Bilateral)    Pain Score: Pre-procedure: 9 /10 Post-procedure: 5 /10   Pre-op Assessment:  Kara [name] is a 68 y.o. (year old), female patient, seen today for interventional treatment. She  has a past surgical history that includes Knee surgery; Carpal tunnel release (Bilateral); Colon resection sigmoid (N/A, 11/02/2016); Colostomy (N/A, 11/02/2016); Sigmoidoscopy (N/A, 11/13/2016); PULMONARY VENOGRAPHY (N/A, 11/19/2016); Colonoscopy with propofol (N/A, 02/03/2017); Colon surgery (11/02/2016); IVC FILTER INSERTION (Right, 10/2016); Colostomy reversal (N/A, 02/24/2017); Appendectomy (N/A, 02/24/2017); Lysis of adhesion (N/A, 02/24/2017); laparoscopy (N/A, 02/24/2017); [place] loop diversion (N/A, 02/24/2017); Sigmoidoscopy (N/A, 05/19/2017); Ileostomy closure (N/A, 06/01/2017); IVC FILTER REMOVAL (N/A, 08/09/2017); Sacroplasty (N/A, 11/08/2017); Cataract extraction w/PHACO (Left, 11/21/2018); and Cataract extraction w/PHACO (Right, 12/12/2018). Kara [name] has a current medication list which includes the following prescription(s): aspirin ec, calcium polycarbophil, candesartan, cyclobenzaprine, gabapentin, glipizide, meclizine, multi-vitamins, ropinirole, warfarin, and warfarin. Her primarily concern today is the Back Pain (bilateral )  Initial Vital Signs:  Pulse/HCG Rate: 95ECG Heart Rate: 90 Temp: 97.9 F (36.6 C) Resp: 16 BP: (!) 131/57 SpO2: 97 %  BMI: Estimated body mass index is 41.63 kg/m as calculated from the following:   Height as of this encounter: 5\' 3"  (1.6 m).   Weight as of this encounter: 235 lb (106.6 kg).  Risk Assessment: Allergies: Reviewed. She is allergic to adhesive [tape]; other; latex; and morphine and related.  Allergy Precautions: None required Coagulopathies: Reviewed. None identified.  Blood-thinner therapy: None at this time Active  Infection(s): Reviewed. None identified. Kara [name] is afebrile  Site Confirmation: Kara [name] was asked to confirm the procedure and laterality before marking the site Procedure checklist:  Completed Consent: Before the procedure and under the influence of no sedative(s), amnesic(s), or anxiolytics, the patient was informed of the treatment options, risks and possible complications. To fulfill our ethical and legal obligations, as recommended by the American Medical Association's Code of Ethics, I have informed the patient of my clinical impression; the nature and purpose of the treatment or procedure; the risks, benefits, and possible complications of the intervention; the alternatives, including doing nothing; the risk(s) and benefit(s) of the alternative treatment(s) or procedure(s); and the risk(s) and benefit(s) of doing nothing. The patient was provided information about the general risks and possible complications associated with the procedure. These may include, but are not limited to: failure to achieve desired goals, infection, bleeding, organ or nerve damage, allergic reactions, paralysis, and death. In addition, the patient was informed of those risks and complications associated to Spine-related procedures, such as failure to decrease pain; infection (i.e.: Meningitis, epidural or intraspinal abscess); bleeding (i.e.: epidural hematoma, subarachnoid hemorrhage, or any other type of intraspinal or peri-dural bleeding); organ or nerve damage (i.e.: Any type of peripheral nerve, nerve root, or spinal cord injury) with subsequent damage to sensory, motor, and/or autonomic systems, resulting in permanent pain, numbness, and/or weakness of one or several areas of the body; allergic reactions; (i.e.: anaphylactic reaction); and/or death. Furthermore, the patient was informed of those risks and complications associated with the medications. These include, but are not limited to:  allergic reactions (i.e.: anaphylactic or anaphylactoid reaction(s)); adrenal axis suppression; blood sugar elevation that in diabetics may result in ketoacidosis or comma; water retention that in patients with history of congestive heart failure may result in shortness of breath, pulmonary edema, and decompensation with resultant heart failure; weight gain; swelling or edema; medication-induced neural toxicity; particulate matter embolism and blood vessel occlusion with resultant organ, and/or nervous system infarction; and/or aseptic necrosis of one or more joints. Finally, the patient was informed that Medicine is not an exact science; therefore, there is also the possibility of unforeseen or unpredictable risks and/or possible complications that may result in a catastrophic outcome. The patient indicated having understood very clearly. We have given the patient no guarantees and we have made no promises. Enough time was given to the patient to ask questions, all of which were answered to the patient's satisfaction. Kara [name] has indicated that she wanted to continue with the procedure. Attestation: I, the ordering provider, attest that I have discussed with the patient the benefits, risks, side-effects, alternatives, likelihood of achieving goals, and potential problems during recovery for the procedure that I have provided informed consent. Date  Time: 12/26/2018  9:14 AM  Pre-Procedure Preparation:  Monitoring: As per clinic protocol. Respiration, ETCO2, SpO2, BP, heart rate and rhythm monitor placed and checked for adequate function Safety Precautions: Patient was assessed for positional comfort and pressure points before starting the procedure. Time-out: I initiated and conducted the "Time-out" before starting the procedure, as per protocol. The patient was asked to participate by confirming the accuracy of the "Time Out" information. Verification of the correct person, site, and procedure  were performed and confirmed by me, the nursing staff, and the patient. "Time-out" conducted as per Joint Commission's Universal Protocol (UP.01.01.01). Time: 0939  Description of Procedure #1:   Time-out: "Time-out" completed before starting procedure, as per protocol. Area Prepped: Entire Posterior Lumbosacral Region Prepping solution: DuraPrep (Iodine Povacrylex [0.7% available iodine] and Isopropyl Alcohol, 74% w/w) Safety Precautions: Aspiration looking for blood return was conducted prior to all injections. At no  point did we inject any substances, as a needle was being advanced. No attempts were made at seeking any paresthesias. Safe injection practices and needle disposal techniques used. Medications properly checked for expiration dates. SDV (single dose vial) medications used.  Description of the Procedure: Protocol guidelines were followed. The patient was placed in position over the fluoroscopy table. The target area was identified and the area prepped in the usual manner. Skin & deeper tissues infiltrated with local anesthetic. Appropriate amount of time allowed to pass for local anesthetics to take effect. The procedure needle was introduced through the skin, ipsilateral to the reported pain, and advanced to the target area. Employing the "Medial Branch Technique", the needles were advanced to the angle made by the superior and medial portion of the transverse process, and the lateral and inferior portion of the superior articulating process of the targeted vertebral bodies. This area is known as "Burton's Eye" or the "Eye of the [place] Dog". A procedure needle was introduced through the skin, and this time advanced to the angle made by the superior and medial border of the sacral ala, and the lateral border of the S1 vertebral body. This last needle was later repositioned at the superior and lateral border of the posterior S1 foramen. Negative aspiration confirmed. Solution injected in  intermittent fashion, asking for systemic symptoms every 0.5cc of injectate. The needles were then removed and the area cleansed, making sure to leave some of the prepping solution back to take advantage of its long term bactericidal properties. Start Time: 0939 hrs. Materials:  Needle(s) Type: Spinal Needle Gauge: 22G Length: 7-in Medication(s): Please see orders for medications and dosing details.  Description of Procedure # 2:   Area Prepped: Entire Posterior Lumbosacral Region Prepping solution: DuraPrep (Iodine Povacrylex [0.7% available iodine] and Isopropyl Alcohol, 74% w/w) Safety Precautions: Aspiration looking for blood return was conducted prior to all injections. At no point did we inject any substances, as a needle was being advanced. No attempts were made at seeking any paresthesias. Safe injection practices and needle disposal techniques used. Medications properly checked for expiration dates. SDV (single dose vial) medications used. Description of the Procedure: Protocol guidelines were followed. The patient was placed in position over the fluoroscopy table. The target area was identified and the area prepped in the usual manner. Skin & deeper tissues infiltrated with local anesthetic. Appropriate amount of time allowed to pass for local anesthetics to take effect. The procedure needle was advanced under fluoroscopic guidance into the sacroiliac joint until a firm endpoint was obtained. Proper needle placement secured. Negative aspiration confirmed. Solution injected in intermittent fashion, asking for systemic symptoms every 0.5cc of injectate. The needles were then removed and the area cleansed, making sure to leave some of the prepping solution back to take advantage of its long term bactericidal properties. Vitals:   12/26/18 0913 12/26/18 0936 12/26/18 0941 12/26/18 0946  BP: (!) 131/57 135/77 140/72 124/69  Pulse: 95     Resp: 16 20 20 19   Temp: 97.9 F (36.6 C)     TempSrc:  Oral     SpO2: 97% 97% 97% 98%  Weight: 235 lb (106.6 kg)     Height: 5\' 3"  (1.6 m)       End Time: 0946 hrs. Materials:  Needle(s) Type: Spinal Needle Gauge: 22G Length: 7-in Medication(s): Please see orders for medications and dosing details.  Imaging Guidance (Spinal):          Type of Imaging Technique: Fluoroscopy Guidance (  Spinal) Indication(s): Assistance in needle guidance and placement for procedures requiring needle placement in or near specific anatomical locations not easily accessible without such assistance. Exposure Time: Please see nurses notes. Contrast: None used. Fluoroscopic Guidance: I was personally present during the use of fluoroscopy. "Tunnel Vision Technique" used to obtain the best possible view of the target area. Parallax error corrected before commencing the procedure. "Direction-depth-direction" technique used to introduce the needle under continuous pulsed fluoroscopy. Once target was reached, antero-posterior, oblique, and lateral fluoroscopic projection used confirm needle placement in all planes. Images permanently stored in EMR. Interpretation: No contrast injected. I personally interpreted the imaging intraoperatively. Adequate needle placement confirmed in multiple planes. Permanent images saved into the patient's record.  Antibiotic Prophylaxis:   Anti-infectives (From admission, onward)   None     Indication(s): None identified  Post-operative Assessment:  Post-procedure Vital Signs:  Pulse/HCG Rate: 9588 Temp: 97.9 F (36.6 C) Resp: 19 BP: 124/69 SpO2: 98 %  EBL: None  Complications: No immediate post-treatment complications observed by team, or reported by patient.  Note: The patient tolerated the entire procedure well. A repeat set of vitals were taken after the procedure and the patient was kept under observation following institutional policy, for this type of procedure. Post-procedural neurological assessment was performed,  showing return to baseline, prior to discharge. The patient was provided with post-procedure discharge instructions, including a section on how to identify potential problems. Should any problems arise concerning this procedure, the patient was given instructions to immediately contact us, at any time, without hesitation. In any case, we plan to contact the patient by telephone for a follow-up status report regarding this interventional procedure.  Comments:  No additional relevant information.  Plan of Care  Orders:  Orders Placed This Encounter  Procedures  . LUMBAR FACET(MEDIAL BRANCH NERVE BLOCK) MBNB    Scheduling Instructions:     Side: Right-sided     Level: L3-4, L4-5, & L5-S1 Facets (L2, L3, L4, L5, & S1 Medial Branch Nerves)     Sedation: With Sedation.     Timeframe: Today    Order Specific Question:   Where will this procedure be performed?    Answer:   [name]  . SACROILIAC JOINT INJECTION    Scheduling Instructions:     Side: Right-sided     Sedation: With Sedation.     Timeframe: Today    Order Specific Question:   Where will this procedure be performed?    Answer:   [name]  . DG PAIN [facility]IN[facility] C-ARM 1-60 MIN NO REPORT    Intraoperative interpretation by procedural physician at [place].    Standing Status:   Standing    Number of Occurrences:   1    Order Specific Question:   Reason for exam:    Answer:   Assistance in needle guidance and placement for procedures requiring needle placement in or near specific anatomical locations not easily accessible without such assistance.  . Care order/instruction: Please confirm that the patient has stopped the Coumadin (Warfarin) X 5 days prior to procedure or surgery.    Please confirm that the patient has stopped the Coumadin (Warfarin) X 5 days prior to procedure or surgery.    Standing Status:   Standing    Number of Occurrences:   1  . Provider attestation of informed consent for  procedure/surgical case    I, the ordering provider, attest that I have discussed with the patient the benefits, risks,  side effects, alternatives, likelihood of achieving goals and potential problems during recovery for the procedure that I have provided informed consent.    Standing Status:   Standing    Number of Occurrences:   1  . Informed Consent Details: Transcribe to consent form and obtain patient signature    Standing Status:   Standing    Number of Occurrences:   1    Order Specific Question:   Procedure    Answer:   Lumbar facet block (medial branch block) under fluoroscopic guidance. (See notes for levels and laterality.)    Order Specific Question:   Surgeon    Answer:   Zailah Zagami A. Dossie Arbour, MD    Order Specific Question:   Indication/Reason    Answer:   Low back pain with or without lower extremity pain.  . Informed Consent Details: Transcribe to consent form and obtain patient signature    Consent Attestation: I, the ordering provider, attest that I have discussed with the patient the benefits, risks, side-effects, alternatives, likelihood of achieving goals, and potential problems during recovery for the procedure that I have provided informed consent.    Standing Status:   Standing    Number of Occurrences:   1    Order Specific Question:   Procedure    Answer:   Diagnostic, right-sided, Sacroiliac joint injection under fluoroscopic guidance    Order Specific Question:   Surgeon    Answer:   Darlisa Spruiell A. Dossie Arbour, MD    Order Specific Question:   Indication/Reason    Answer:   Low back pain secondary sleep to sacroiliac joint dysfunction/arthralgia  . Bleeding precautions    Standing Status:   Standing    Number of Occurrences:   1  . Latex precautions    Activate Latex-Free Protocol.    Standing Status:   Standing    Number of Occurrences:   1   Chronic Opioid Analgesic:  Hydrocodone/APAP 5/325, 1 tab PO QD PRN (5 mg/day of hydrocodone) (#60 was lasting her 4-5  mo) MME: 5 mg/day.   Medications ordered for procedure: Meds ordered this encounter  Medications  . lidocaine (XYLOCAINE) 2 % (with pres) injection 400 mg  . DISCONTD: lactated ringers infusion 1,000 mL  . DISCONTD: midazolam (VERSED) 5 MG/5ML injection 1-2 mg    Make sure Flumazenil is available in the pyxis when using this medication. If oversedation occurs, administer 0.2 mg IV over 15 sec. If after 45 sec no response, administer 0.2 mg again over 1 min; may repeat at 1 min intervals; not to exceed 4 doses (1 mg)  . DISCONTD: fentaNYL (SUBLIMAZE) injection 25-50 mcg    Make sure Narcan is available in the pyxis when using this medication. In the event of respiratory depression (RR< 8/min): Titrate NARCAN (naloxone) in increments of 0.1 to 0.2 mg IV at 2-3 minute intervals, until desired degree of reversal.  . ropivacaine (PF) 2 mg/mL (0.2%) (NAROPIN) injection 9 mL  . triamcinolone acetonide (KENALOG-40) injection 40 mg  . ropivacaine (PF) 2 mg/mL (0.2%) (NAROPIN) injection 4 mL  . methylPREDNISolone acetate (DEPO-MEDROL) injection 80 mg   Medications administered: We administered lidocaine, ropivacaine (PF) 2 mg/mL (0.2%), triamcinolone acetonide, ropivacaine (PF) 2 mg/mL (0.2%), and methylPREDNISolone acetate.  See the medical record for exact dosing, route, and time of administration.  Follow-up plan:   Return for (VV), 2 wk PP-F/U Eval.       Interventional management options:  Considering:   NOTE:COUMADINANTICOAGULATION(Stop:5days  Re-start: 2hrs) NO RFAuntil BMIis <  35. Diagnostic L5-S1 LESI Diagnostic bilateral L3TFESI Diagnostic bilateral L4TFESI Diagnostic bilateral L5 TFESI Possible right-sided sacroiliac joint RFA Diagnostic bilateral lumbar facet block Possible bilateral lumbar facet RFA Diagnostic right-sided IA hip joint injection Diagnostic right-sided femoral nerve +Obturatornerve block Possible right-sided femoral nerve + obturator RFA    Palliative PRN treatment(s):   Palliative/Therapeutic left-sided L4-5 interlaminar LESI #2 Diagnostic left lumbar facet block#4 Palliativeright lumbar facet block(s) #8 Palliative right-sided sacroiliac joint block #4 Palliative/Therapeutic (Midline) caudalESI#4      Recent Visits Date Type Provider Dept  11/13/18 Office Visit Milinda Pointer, MD [name]  10/31/18 Procedure visit Milinda Pointer, MD [name]  Showing recent visits within past 90 days and meeting all other requirements   Today's Visits Date Type Provider Dept  12/26/18 Procedure visit Milinda Pointer, MD [name]  Showing today's visits and meeting all other requirements   Future Appointments Date Type Provider Dept  02/05/19 Appointment Milinda Pointer, MD [name]  Showing future appointments within next 90 days and meeting all other requirements   Disposition: Discharge home  Discharge Date & Time: 12/26/2018; 1000 hrs.   Primary Care Physician: Tracie Harrier, MD Location: [facility] Note by: Gaspar Cola, MD Date: 12/26/2018; Time: 10:26 AM  Disclaimer:  Medicine is not an Chief Strategy Officer. The only guarantee in medicine is that nothing is guaranteed. It is important to note that the decision to proceed with this intervention was based on the information collected from the patient. The Data and conclusions were drawn from the patient's questionnaire, the interview, and the physical examination. Because the information was provided in large part by the patient, it cannot be guaranteed that it has not been purposely or unconsciously manipulated. Every effort has been made to obtain as much relevant data as possible for this evaluation. It is important to note that the conclusions that lead to this procedure are derived in large part from the available data. Always take into account that the treatment  will also be dependent on availability of resources and existing treatment guidelines, considered by other Pain Management Practitioners as being common knowledge and practice, at the time of the intervention. For Medico-Legal purposes, it is also important to point out that variation in procedural techniques and pharmacological choices are the acceptable norm. The indications, contraindications, technique, and results of the above procedure should only be interpreted and judged by a Board-Certified Interventional Pain Specialist with extensive familiarity and expertise in the same exact procedure and technique.

## 2018-12-26 ENCOUNTER — Ambulatory Visit (HOSPITAL_BASED_OUTPATIENT_CLINIC_OR_DEPARTMENT_OTHER): Payer: PPO | Admitting: Pain Medicine

## 2018-12-26 ENCOUNTER — Encounter: Payer: Self-pay | Admitting: Pain Medicine

## 2018-12-26 ENCOUNTER — Ambulatory Visit
Admission: RE | Admit: 2018-12-26 | Discharge: 2018-12-26 | Disposition: A | Discharge status: Home / Self Care | Payer: PPO | Source: Ambulatory Visit | Attending: Pain Medicine | Admitting: Pain Medicine

## 2018-12-26 ENCOUNTER — Other Ambulatory Visit: Payer: Self-pay

## 2018-12-26 VITALS — BP 124/69 | HR 95 | Temp 97.9°F | Resp 19 | Ht 63.0 in | Wt 235.0 lb

## 2018-12-26 DIAGNOSIS — M4310 Spondylolisthesis, site unspecified: Secondary | ICD-10-CM

## 2018-12-26 DIAGNOSIS — M5388 Other specified dorsopathies, sacral and sacrococcygeal region: Secondary | ICD-10-CM

## 2018-12-26 DIAGNOSIS — M5137 Other intervertebral disc degeneration, lumbosacral region: Secondary | ICD-10-CM

## 2018-12-26 DIAGNOSIS — M47816 Spondylosis without myelopathy or radiculopathy, lumbar region: Secondary | ICD-10-CM | POA: Diagnosis not present

## 2018-12-26 DIAGNOSIS — M47817 Spondylosis without myelopathy or radiculopathy, lumbosacral region: Secondary | ICD-10-CM

## 2018-12-26 DIAGNOSIS — G8929 Other chronic pain: Secondary | ICD-10-CM

## 2018-12-26 DIAGNOSIS — M533 Sacrococcygeal disorders, not elsewhere classified: Secondary | ICD-10-CM

## 2018-12-26 DIAGNOSIS — M545 Low back pain: Secondary | ICD-10-CM | POA: Insufficient documentation

## 2018-12-26 MED ORDER — FENTANYL CITRATE (PF) 100 MCG/2ML IJ SOLN: 25.0000 ug | INTRAMUSCULAR | Status: DC | PRN

## 2018-12-26 MED ORDER — MIDAZOLAM HCL 5 MG/5ML IJ SOLN: 1.0000 mg | INTRAMUSCULAR | Status: DC | PRN

## 2018-12-26 MED ORDER — ROPIVACAINE HCL 2 MG/ML IJ SOLN
4.0000 mL | Freq: Once | INTRAMUSCULAR | Status: AC
  Administered 2018-12-26: 4 mL via INTRA_ARTICULAR
  Filled 2018-12-26: qty 10

## 2018-12-26 MED ORDER — LIDOCAINE HCL 2 % IJ SOLN
20.0000 mL | Freq: Once | INTRAMUSCULAR | Status: AC
  Administered 2018-12-26: 10:00:00 400 mg
  Filled 2018-12-26: qty 40

## 2018-12-26 MED ORDER — ROPIVACAINE HCL 2 MG/ML IJ SOLN
9.0000 mL | Freq: Once | INTRAMUSCULAR | Status: AC
  Administered 2018-12-26: 9 mL via PERINEURAL
  Filled 2018-12-26: qty 10

## 2018-12-26 MED ORDER — TRIAMCINOLONE ACETONIDE 40 MG/ML IJ SUSP
40.0000 mg | Freq: Once | INTRAMUSCULAR | Status: AC
  Administered 2018-12-26: 40 mg
  Filled 2018-12-26: qty 1

## 2018-12-26 MED ORDER — LACTATED RINGERS IV SOLN: 1000.0000 mL | Freq: Once | INTRAVENOUS | Status: DC

## 2018-12-26 MED ORDER — METHYLPREDNISOLONE ACETATE 80 MG/ML IJ SUSP
80.0000 mg | Freq: Once | INTRAMUSCULAR | Status: AC
  Administered 2018-12-26: 80 mg via INTRA_ARTICULAR
  Filled 2018-12-26: qty 1

## 2018-12-27 ENCOUNTER — Telehealth: Payer: Self-pay | Admitting: *Deleted

## 2018-12-27 NOTE — Telephone Encounter (Signed)
Denies any issues post procedure.

## 2018-12-29 DIAGNOSIS — E119 Type 2 diabetes mellitus without complications: Secondary | ICD-10-CM | POA: Diagnosis not present

## 2018-12-29 DIAGNOSIS — I10 Essential (primary) hypertension: Secondary | ICD-10-CM | POA: Diagnosis not present

## 2018-12-29 DIAGNOSIS — W010XXA Fall on same level from slipping, tripping and stumbling without subsequent striking against object, initial encounter: Secondary | ICD-10-CM | POA: Diagnosis not present

## 2018-12-29 DIAGNOSIS — Z86711 Personal history of pulmonary embolism: Secondary | ICD-10-CM | POA: Diagnosis not present

## 2018-12-29 DIAGNOSIS — S8012XA Contusion of left lower leg, initial encounter: Secondary | ICD-10-CM | POA: Diagnosis not present

## 2019-01-03 ENCOUNTER — Other Ambulatory Visit: Payer: Self-pay

## 2019-01-03 ENCOUNTER — Encounter: Payer: PPO | Attending: Internal Medicine | Admitting: Internal Medicine

## 2019-01-03 DIAGNOSIS — L97822 Non-pressure chronic ulcer of other part of left lower leg with fat layer exposed: Secondary | ICD-10-CM | POA: Diagnosis not present

## 2019-01-03 DIAGNOSIS — Z85038 Personal history of other malignant neoplasm of large intestine: Secondary | ICD-10-CM | POA: Insufficient documentation

## 2019-01-03 DIAGNOSIS — Z7901 Long term (current) use of anticoagulants: Secondary | ICD-10-CM | POA: Diagnosis not present

## 2019-01-03 DIAGNOSIS — L97222 Non-pressure chronic ulcer of left calf with fat layer exposed: Secondary | ICD-10-CM | POA: Diagnosis not present

## 2019-01-03 DIAGNOSIS — Z86718 Personal history of other venous thrombosis and embolism: Secondary | ICD-10-CM | POA: Diagnosis not present

## 2019-01-03 DIAGNOSIS — I110 Hypertensive heart disease with heart failure: Secondary | ICD-10-CM | POA: Diagnosis not present

## 2019-01-03 DIAGNOSIS — E11622 Type 2 diabetes mellitus with other skin ulcer: Secondary | ICD-10-CM | POA: Diagnosis not present

## 2019-01-03 DIAGNOSIS — I509 Heart failure, unspecified: Secondary | ICD-10-CM | POA: Insufficient documentation

## 2019-01-03 DIAGNOSIS — M1990 Unspecified osteoarthritis, unspecified site: Secondary | ICD-10-CM | POA: Insufficient documentation

## 2019-01-04 NOTE — Progress Notes (Signed)
MARYJO, RAGON ([id number]) Visit Report for 01/03/2019 Chief Complaint Document Details Patient Name: JACKSON-Gagliano, Koreena K. Date of Service: 01/03/2019 9:45 AM Medical Record Number: [id number] Patient Account Number: [id number] Date of Birth/Sex: [birth year] (68 y.o. F) Treating RN: Cornell Barman Primary Care Provider: Tracie Harrier Other Clinician: Referring Provider: Mortimer Fries Treating Provider/Extender: Beverly Gust in Treatment: 0 Information Obtained from: Patient Chief Complaint Left Leg ulcer x 2 weeks Electronic Signature(s) Signed: 01/03/2019 10:21:42 AM By: Tobi Bastos Entered By: Tobi Bastos on 01/03/2019 10:21:41 JACKSON-Yasuda, Latifa K. ([id number]) -------------------------------------------------------------------------------- Debridement Details Patient Name: JACKSON-Cham, Astou K. Date of Service: 01/03/2019 9:45 AM Medical Record Number: [id number] Patient Account Number: [id number] Date of Birth/Sex: 1950/10/18 (68 y.o. F) Treating RN: Cornell Barman Primary Care Provider: Tracie Harrier Other Clinician: Referring Provider: Mortimer Fries Treating Provider/Extender: Beverly Gust in Treatment: 0 Debridement Performed for Wound #1 Left,Anterior Lower Leg Assessment: Performed By: Physician Tobi Bastos, MD Debridement Type: Debridement Severity of Tissue Pre Fat layer exposed Debridement: Level of Consciousness (Pre- Awake and Alert procedure): Pre-procedure Verification/Time Yes - 10:11 Out Taken: Start Time: 10:12 Pain Control: Lidocaine Total Area Debrided (L x W): 3.8 (cm) x 1.8 (cm) = 6.84 (cm) Tissue and other material Non-Viable, Blood Clots, Fat, Subcutaneous debrided: Level: Skin/Subcutaneous Tissue Debridement Description: Excisional Instrument: Blade, Curette, Forceps Bleeding: Moderate Hemostasis Achieved: Pressure End Time: 10:16 Response to Treatment: Procedure was tolerated well Level  of Consciousness Awake and Alert (Post-procedure): Post Debridement Measurements of Total Wound Length: (cm) 3.8 Width: (cm) 1.8 Depth: (cm) 1.5 Volume: (cm) 8.058 Character of Wound/Ulcer Post Debridement: Requires Further Debridement Severity of Tissue Post Debridement: Fat layer exposed Post Procedure Diagnosis Same as Pre-procedure Electronic Signature(s) Signed: 01/03/2019 4:28:38 PM By: Tobi Bastos Signed: 01/03/2019 5:00:13 PM By: Gretta Cool, BSN, RN, CWS, Kim RN, BSN Entered By: Gretta Cool, BSN, RN, CWS, Kim on 01/03/2019 10:18:11 JACKSON-Dority, Xiao K. ([id number]) -------------------------------------------------------------------------------- HPI Details Patient Name: JACKSON-Baranski, Lucilla K. Date of Service: 01/03/2019 9:45 AM Medical Record Number: [id number] Patient Account Number: [id number] Date of Birth/Sex: Jan 20, 1951 (68 y.o. F) Treating RN: Cornell Barman Primary Care Provider: Tracie Harrier Other Clinician: Referring Provider: Mortimer Fries Treating Provider/Extender: Beverly Gust in Treatment: 0 History of Present Illness HPI Description: Pleasant 68 year old Caucasian female who fell down a few steps in her home 2 weeks ago and developed bruises on both legs on the shins, on the left shin area she did have an open area that scabbed, patient did ED on 8/1 at which point she had dressing applied to the left shin wound, she had a right shin hematoma that was lanced with oozing of blood after. Since that time patient has noticed that the left calf leg wound developed a black covering on top and had started to hurt, she has been dressing this daily with an antibiotic ointment and gauze wrap. 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 Since a week now she has been on Keflex 500 twice daily prescribed by her PCP Patient's history significant for type 2 diabetes, remote history of DVT for which  she is on Coumadin, Also remote history of PE for which she has been on Coumadin indefinitely, essential hypertension, history of colon cancer, history of CHF that is compensated, history of back pain ABIs today in the clinic 1.04 on the right 1.07 on the left Electronic Signature(s) Signed: 01/03/2019 10:26:05 AM By: Tobi Bastos Entered By: Tobi Bastos on  01/03/2019 10:26:05 JACKSON-Gorder, Aleayah K. ([id number]) -------------------------------------------------------------------------------- Physical Exam Details Patient Name: JACKSON-Farrey, Teighlor K. Date of Service: 01/03/2019 9:45 AM Medical Record Number: [id number] Patient Account Number: [id number] Date of Birth/Sex: [birth year] (68 y.o. F) Treating RN: Cornell Barman Primary Care Provider: Tracie Harrier Other Clinician: Referring Provider: Mortimer Fries Treating Provider/Extender: Beverly Gust in Treatment: 0 Constitutional alert and oriented x 3. sitting or standing blood pressure is within target range for patient.. supine blood pressure is within target range for patient.. pulse regular and within target range for patient.Marland Kitchen respirations regular, non-labored and within target range for patient.Marland Kitchen temperature within target range for patient.. . . Well-nourished and well-hydrated in no acute distress. Eyes conjunctiva clear no eyelid edema noted. pupils equal round and reactive to light and accommodation. Ears, Nose, Mouth, and Throat no gross abnormality of ear auricles or external auditory canals. normal hearing noted during conversation. mucus membranes moist. Neck supple with no LAD noted in anterior or posterior cervical chain. not enlarged. Respiratory normal breathing without difficulty. clear to auscultation bilaterally. Cardiovascular regular rate and rhythm with normal S1, S2. no bruits with no significant JVD. 2+ femoral pulses. 2+ dorsalis pedis/posterior tibialis pulses. no clubbing, cyanosis,  significant edema, <3 sec cap refill. Gastrointestinal (GI) soft, non-tender, non-distended, +BS. no hepatosplenomegaly. no ventral hernia noted. Neurological cranial nerves 2-12 intact. Patient has normal sensation in the feet bilaterally to light touch. Psychiatric this patient is able to make decisions and demonstrates good insight into disease process. Alert and Oriented x 3. pleasant and cooperative. Notes Left anterior leg wound with dense covering of organizing hematoma especially in the superior aspect about halfway down the wound, this was removed with curette and then pickup with forceps, there was not any bleeding from this area, the top part of the wound goes down to subcutaneous fat, surrounding skin appears intact with no inflammation The right leg hematoma is small and non-tense, non-tender Electronic Signature(s) Signed: 01/03/2019 10:27:54 AM By: Tobi Bastos Entered By: Tobi Bastos on 01/03/2019 10:27:54 JACKSON-Haji, Amando K. ([id number]) -------------------------------------------------------------------------------- Physician Orders Details Patient Name: JACKSON-Bedoya, Chinara K. Date of Service: 01/03/2019 9:45 AM Medical Record Number: [id number] Patient Account Number: [id number] Date of Birth/Sex: 1950-09-04 (68 y.o. F) Treating RN: Cornell Barman Primary Care Provider: Tracie Harrier Other Clinician: Referring Provider: Mortimer Fries Treating Provider/Extender: Beverly Gust in Treatment: 0 Verbal / Phone Orders: No Diagnosis Coding Wound Cleansing Wound #1 Left,Anterior Lower Leg o May shower with protection. Anesthetic (add to Medication List) Wound #1 Left,Anterior Lower Leg o Topical Lidocaine 4% cream applied to wound bed prior to debridement (In Clinic Only). o Benzocaine Topical Anesthetic Spray applied to wound bed prior to debridement (In Clinic Only). Primary Wound Dressing Wound #1 Left,Anterior Lower Leg o Silver  Alginate Secondary Dressing Wound #1 Left,Anterior Lower Leg o ABD pad Dressing Change Frequency Wound #1 Left,Anterior Lower Leg o Change dressing every week Follow-up Appointments Wound #1 Left,Anterior Lower Leg o Return Appointment in 1 week. o Nurse Visit as needed Edema Control Wound #1 Left,Anterior Lower Leg o 3 Layer Compression System - Left Lower Extremity o Elevate legs to the level of the heart and pump ankles as often as possible o Other: - Ace Wrap right Medications-please add to medication list. Wound #1 Left,Anterior Lower Leg o P.O. Antibiotics - Complete antibiotics Electronic Signature(s) Signed: 01/03/2019 4:28:38 PM By: Tobi Bastos Signed: 01/03/2019 5:00:13 PM By: Gretta Cool, BSN, RN, CWS, Kim RN, BSN JACKSON-Grow, Jaleya K. ([id number]) Entered By: Gretta Cool, BSN, RN, CWS, Kim  on 01/03/2019 10:22:27 JACKSON-Runnels, Punam K. ([id number]) -------------------------------------------------------------------------------- Problem List Details Patient Name: JACKSON-Brackeen, Aretha K. Date of Service: 01/03/2019 9:45 AM Medical Record Number: [id number] Patient Account Number: [id number] Date of Birth/Sex: June 03, 1950 (68 y.o. F) Treating RN: Cornell Barman Primary Care Provider: Tracie Harrier Other Clinician: Referring Provider: Mortimer Fries Treating Provider/Extender: Beverly Gust in Treatment: 0 Active Problems ICD-10 Evaluated Encounter Code Description Active Date Today Diagnosis L97.222 Non-pressure chronic ulcer of left calf with fat layer exposed 01/03/2019 No Yes E11.9 Type 2 diabetes mellitus without complications 7/32/2025 No Yes Inactive Problems Resolved Problems Electronic Signature(s) Signed: 01/03/2019 10:21:21 AM By: Tobi Bastos Entered By: Tobi Bastos on 01/03/2019 10:21:21 JACKSON-Turck, Caled[place] K. ([id number]) -------------------------------------------------------------------------------- Progress Note  Details Patient Name: JACKSON-Sperry, Marra K. Date of Service: 01/03/2019 9:45 AM Medical Record Number: [id number] Patient Account Number: [id number] Date of Birth/Sex: 1950-11-28 (68 y.o. F) Treating RN: Cornell Barman Primary Care Provider: Tracie Harrier Other Clinician: Referring Provider: Mortimer Fries Treating Provider/Extender: Beverly Gust in Treatment: 0 Subjective Chief Complaint Information obtained from Patient Left Leg ulcer x 2 weeks History of Present Illness (HPI) Pleasant 68 year old Caucasian female who fell down a few steps in her home 2 weeks ago and developed bruises on both legs on the shins, on the left shin area she did have an open area that scabbed, patient did ED on 8/1 at which point she had dressing applied to the left shin wound, she had a right shin hematoma that was lanced with oozing of blood after. Since that time patient has noticed that the left calf leg wound developed a black covering on top and had started to hurt, she has been dressing this daily with an antibiotic ointment and gauze wrap. 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 Since a week now she has been on Keflex 500 twice daily prescribed by her PCP Patient's history significant for type 2 diabetes, remote history of DVT for which she is on Coumadin, Also remote history of PE for which she has been on Coumadin indefinitely, essential hypertension, history of colon cancer, history of CHF that is compensated, history of back pain ABIs today in the clinic 1.04 on the right 1.07 on the left Patient History Information obtained from Patient. Allergies morphine (Reaction: nausea) Family History Diabetes - Siblings, No family history of Cancer, Heart Disease, Hereditary Spherocytosis, Hypertension, Kidney Disease, Lung Disease, Seizures, Stroke, Thyroid Problems, Tuberculosis. Social History Never smoker, Marital  Status - Married, Alcohol Use - Never, Drug Use - No History, Caffeine Use - Daily. Medical History Respiratory Denies history of Aspiration, Asthma, Chronic Obstructive Pulmonary Disease (COPD), Pneumothorax, Sleep Apnea, Tuberculosis Cardiovascular Patient has history of Congestive Heart Failure, Deep Vein Thrombosis, Hypertension Denies history of Angina, Arrhythmia, Coronary Artery Disease, Hypotension, Myocardial Infarction, Peripheral Arterial Disease, Peripheral Venous Disease, Phlebitis, Vasculitis Endocrine Patient has history of Type II Diabetes Denies history of Type I Diabetes Integumentary (Skin) Denies history of History of Burn, History of pressure wounds JACKSON-Valley, Zabella K. ([id number]) Musculoskeletal Patient has history of Osteoarthritis Denies history of Gout, Rheumatoid Arthritis, Osteomyelitis Neurologic Patient has history of Neuropathy Denies history of Dementia, Quadriplegia, Paraplegia, Seizure Disorder Patient is treated with Oral Agents. Blood sugar is tested. Medical And Surgical History Notes Respiratory hx pulmonary embolism Musculoskeletal OP Review of Systems (ROS) Constitutional Symptoms (General Health) Denies complaints or symptoms of Fatigue, Fever, Chills, Marked Weight Change. Eyes Denies complaints or symptoms  of Dry Eyes, Vision Changes, Glasses / Contacts. Ear/Nose/Mouth/Throat Denies complaints or symptoms of Difficult clearing ears, Sinusitis. Hematologic/Lymphatic Denies complaints or symptoms of Bleeding / Clotting Disorders, Human Immunodeficiency Virus. Respiratory Denies complaints or symptoms of Chronic or frequent coughs, Shortness of Breath. Cardiovascular Denies complaints or symptoms of Chest pain, LE edema. Gastrointestinal Denies complaints or symptoms of Frequent diarrhea, Nausea, Vomiting. Endocrine Denies complaints or symptoms of Hepatitis, Thyroid disease, Polydypsia (Excessive Thirst). Genitourinary Denies  complaints or symptoms of Kidney failure/ Dialysis, Incontinence/dribbling. Immunological Denies complaints or symptoms of Hives, Itching. Integumentary (Skin) Complains or has symptoms of Wounds, Bleeding or bruising tendency. Denies complaints or symptoms of Breakdown, Swelling. Musculoskeletal Denies complaints or symptoms of Muscle Pain, Muscle Weakness. Neurologic Denies complaints or symptoms of Numbness/parasthesias, Focal/Weakness. Psychiatric Denies complaints or symptoms of Anxiety, Claustrophobia. Objective Constitutional alert and oriented x 3. sitting or standing blood pressure is within target range for patient.. supine blood pressure is within target range for patient.. pulse regular and within target range for patient.Marland Kitchen respirations regular, non-labored and within target JACKSON-Baham, Debbi K. ([id number]) range for patient.Marland Kitchen temperature within target range for patient.. Well-nourished and well-hydrated in no acute distress. Vitals Time Taken: 9:35 AM, Height: 63 in, Source: Stated, Weight: 227 lbs, Source: Measured, BMI: 40.2, Temperature: 98.2 F, Pulse: 84 bpm, Respiratory Rate: 16 breaths/min, Blood Pressure: 154/60 mmHg. Eyes conjunctiva clear no eyelid edema noted. pupils equal round and reactive to light and accommodation. Ears, Nose, Mouth, and Throat no gross abnormality of ear auricles or external auditory canals. normal hearing noted during conversation. mucus membranes moist. Neck supple with no LAD noted in anterior or posterior cervical chain. not enlarged. Respiratory normal breathing without difficulty. clear to auscultation bilaterally. Cardiovascular regular rate and rhythm with normal S1, S2. no bruits with no significant JVD. 2+ femoral pulses. 2+ dorsalis pedis/posterior tibialis pulses. no clubbing, cyanosis, significant edema, Gastrointestinal (GI) soft, non-tender, non-distended, +BS. no hepatosplenomegaly. no ventral hernia  noted. Neurological cranial nerves 2-12 intact. Patient has normal sensation in the feet bilaterally to light touch. Psychiatric this patient is able to make decisions and demonstrates good insight into disease process. Alert and Oriented x 3. pleasant and cooperative. General Notes: Left anterior leg wound with dense covering of organizing hematoma especially in the superior aspect about halfway down the wound, this was removed with curette and then pickup with forceps, there was not any bleeding from this area, the top part of the wound goes down to subcutaneous fat, surrounding skin appears intact with no inflammation The right leg hematoma is small and non-tense, non-tender Integumentary (Hair, Skin) Wound #1 status is Open. Original cause of wound was Trauma. The wound is located on the Left,Anterior Lower Leg. The wound measures 3.8cm length x 1.8cm width x 0.7cm depth; 5.372cm^2 area and 3.76cm^3 volume. There is Fat Layer (Subcutaneous Tissue) Exposed exposed. There is no tunneling or undermining noted. There is a medium amount of serous drainage noted. The wound margin is flat and intact. There is small (1-33%) pink granulation within the wound bed. There is a large (67-100%) amount of necrotic tissue within the wound bed including Eschar and Adherent Slough. Assessment Active Problems ICD-10 Non-pressure chronic ulcer of left calf with fat layer exposed Type 2 diabetes mellitus without complications JACKSON-Pogorzelski, Earlisha K. ([id number]) Procedures Wound #1 Pre-procedure diagnosis of Wound #1 is a Diabetic Wound/Ulcer of the Lower Extremity located on the Left,Anterior Lower Leg .Severity of Tissue Pre Debridement is: Fat layer exposed. There was a Excisional Skin/Subcutaneous Tissue  Debridement with a total area of 6.84 sq cm performed by Tobi Bastos, MD. With the following instrument(s): Blade, Curette, and Forceps to remove Non-Viable tissue/material. Material removed  includes Blood Clots, Fat, and Subcutaneous Tissue after achieving pain control using Lidocaine. No specimens were taken. A time out was conducted at 10:11, prior to the start of the procedure. A Moderate amount of bleeding was controlled with Pressure. The procedure was tolerated well. Post Debridement Measurements: 3.8cm length x 1.8cm width x 1.5cm depth; 8.058cm^3 volume. Character of Wound/Ulcer Post Debridement requires further debridement. Severity of Tissue Post Debridement is: Fat layer exposed. Post procedure Diagnosis Wound #1: Same as Pre-Procedure Plan Wound Cleansing: Wound #1 Left,Anterior Lower Leg: May shower with protection. Anesthetic (add to Medication List): Wound #1 Left,Anterior Lower Leg: Topical Lidocaine 4% cream applied to wound bed prior to debridement (In Clinic Only). Benzocaine Topical Anesthetic Spray applied to wound bed prior to debridement (In Clinic Only). Primary Wound Dressing: Wound #1 Left,Anterior Lower Leg: Silver Alginate Secondary Dressing: Wound #1 Left,Anterior Lower Leg: ABD pad Dressing Change Frequency: Wound #1 Left,Anterior Lower Leg: Change dressing every week Follow-up Appointments: Wound #1 Left,Anterior Lower Leg: Return Appointment in 1 week. Nurse Visit as needed Edema Control: Wound #1 Left,Anterior Lower Leg: 3 Layer Compression System - Left Lower Extremity Elevate legs to the level of the heart and pump ankles as often as possible Other: - Ace Wrap right Medications-please add to medication list.: Wound #1 Left,Anterior Lower Leg: P.O. Antibiotics - Complete antibiotics 1. Patient will be started on silver alginate with 3 layer compression wrap to the left leg, This is a wound with organizing hematoma, with very little left behind that can be removed at further debridements, overall this wound should heal up nicely JACKSON-Tolan, Emonnie K. ([id number]) over the ensuing few weeks. 2. Patient was completing Keflex  which she will be done within 2 days 3. Patient has been advised to keep leg elevated as much as possible 4. Return to clinic next week Electronic Signature(s) Signed: 01/03/2019 10:29:38 AM By: Tobi Bastos Entered By: Tobi Bastos on 01/03/2019 10:29:38 JACKSON-Buonocore, Kyrstin K. ([id number]) -------------------------------------------------------------------------------- ROS/PFSH Details Patient Name: JACKSON-Buckalew, Narmeen K. Date of Service: 01/03/2019 9:45 AM Medical Record Number: [id number] Patient Account Number: [id number] Date of Birth/Sex: 12-03-1950 (68 y.o. F) Treating RN: Montey Hora Primary Care Provider: Tracie Harrier Other Clinician: Referring Provider: Mortimer Fries Treating Provider/Extender: Beverly Gust in Treatment: 0 Information Obtained From Patient Constitutional Symptoms (General Health) Complaints and Symptoms: Negative for: Fatigue; Fever; Chills; Marked Weight Change Eyes Complaints and Symptoms: Negative for: Dry Eyes; Vision Changes; Glasses / Contacts Ear/Nose/Mouth/Throat Complaints and Symptoms: Negative for: Difficult clearing ears; Sinusitis Hematologic/Lymphatic Complaints and Symptoms: Negative for: Bleeding / Clotting Disorders; Human Immunodeficiency Virus Respiratory Complaints and Symptoms: Negative for: Chronic or frequent coughs; Shortness of Breath Medical History: Negative for: Aspiration; Asthma; Chronic Obstructive Pulmonary Disease (COPD); Pneumothorax; Sleep Apnea; Tuberculosis Past Medical History Notes: hx pulmonary embolism Cardiovascular Complaints and Symptoms: Negative for: Chest pain; LE edema Medical History: Positive for: Congestive Heart Failure; Deep Vein Thrombosis; Hypertension Negative for: Angina; Arrhythmia; Coronary Artery Disease; Hypotension; Myocardial Infarction; Peripheral Arterial Disease; Peripheral Venous Disease; Phlebitis; Vasculitis Gastrointestinal Complaints and  Symptoms: Negative for: Frequent diarrhea; Nausea; Vomiting Endocrine JACKSON-Knisely, Abbegail K. ([id number]) Complaints and Symptoms: Negative for: Hepatitis; Thyroid disease; Polydypsia (Excessive Thirst) Medical History: Positive for: Type II Diabetes Negative for: Type I Diabetes Time with diabetes: 5 years Treated with: Oral agents Blood sugar tested every day: Yes Tested :  QD Genitourinary Complaints and Symptoms: Negative for: Kidney failure/ Dialysis; Incontinence/dribbling Immunological Complaints and Symptoms: Negative for: Hives; Itching Integumentary (Skin) Complaints and Symptoms: Positive for: Wounds; Bleeding or bruising tendency Negative for: Breakdown; Swelling Medical History: Negative for: History of Burn; History of pressure wounds Musculoskeletal Complaints and Symptoms: Negative for: Muscle Pain; Muscle Weakness Medical History: Positive for: Osteoarthritis Negative for: Gout; Rheumatoid Arthritis; Osteomyelitis Past Medical History Notes: OP Neurologic Complaints and Symptoms: Negative for: Numbness/parasthesias; Focal/Weakness Medical History: Positive for: Neuropathy Negative for: Dementia; Quadriplegia; Paraplegia; Seizure Disorder Psychiatric Complaints and Symptoms: Negative for: Anxiety; Claustrophobia Immunizations Pneumococcal Vaccine: Received Pneumococcal Vaccination: Yes JACKSON-Poffenberger, Chondra K. ([id number]) Implantable Devices None Family and Social History Cancer: No; Diabetes: Yes - Siblings; Heart Disease: No; Hereditary Spherocytosis: No; Hypertension: No; Kidney Disease: No; Lung Disease: No; Seizures: No; Stroke: No; Thyroid Problems: No; Tuberculosis: No; Never smoker; Marital Status - Married; Alcohol Use: Never; Drug Use: No History; Caffeine Use: Daily; Financial Concerns: No; Food, Clothing or Shelter Needs: No; Support System Lacking: No; Transportation Concerns: No Electronic Signature(s) Signed: 01/03/2019 3:05:31 PM  By: Montey Hora Signed: 01/03/2019 4:28:38 PM By: Tobi Bastos Entered By: Montey Hora on 01/03/2019 09:47:00 [place], [place]. ([id number]) -------------------------------------------------------------------------------- SuperBill Details Patient Name: JACKSON-Vannostrand, Narcisa K. Date of Service: 01/03/2019 Medical Record Number: [id number] Patient Account Number: [id number] Date of Birth/Sex: 1950-09-16 (68 y.o. F) Treating RN: Cornell Barman Primary Care Provider: Tracie Harrier Other Clinician: Referring Provider: Mortimer Fries Treating Provider/Extender: Beverly Gust in Treatment: 0 Diagnosis Coding ICD-10 Codes Code Description [phone] Non-pressure chronic ulcer of left calf with fat layer exposed E11.9 Type 2 diabetes mellitus without complications Facility Procedures CPT4 Code: [id number] Description: [place] VISIT-LEV 3 EST PT Modifier: Quantity: 1 CPT4 Code: [id number] Description: 56389 - DEB SUBQ TISSUE 20 SQ CM/< ICD-10 Diagnosis Description L97.222 Non-pressure chronic ulcer of left calf with fat layer expos Modifier: ed Quantity: 1 Physician Procedures CPT4 Code: 3734287 Description: 68115 - WC PHYS LEVEL 4 - NEW PT ICD-10 Diagnosis Description 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

## 2019-01-04 NOTE — Progress Notes (Signed)
NITASHA, JEWEL ([id number]) Visit Report for 01/03/2019 Allergy List Details Patient Name: [name], [name] Date of Service: 01/03/2019 9:45 AM Medical Record Number: [id number] Patient Account Number: [id number] Date of Birth/Sex: [birth year] (68 y.o. F) Treating RN: Kara [name] Primary Care Kara [name]: Kara [name] Other Clinician: Referring Kara [name]: Kara [name] Treating Kara [name]/Extender: Kara [name] in Treatment: 0 Allergies Active Allergies morphine Reaction: nausea Allergy Notes Electronic Signature(s) Signed: 01/03/2019 3:05:31 PM By: Kara [name] Entered By: Kara [name] on 01/03/2019 09:41:36 [name], [name] ([id number]) -------------------------------------------------------------------------------- Arrival Information Details Patient Name: [name], [name] Date of Service: 01/03/2019 9:45 AM Medical Record Number: [id number] Patient Account Number: [id number] Date of Birth/Sex: 1950-06-06 (68 y.o. F) Treating RN: Kara [name] Primary Care Kara [name]: Kara [name] Other Clinician: Referring Kara [name]: Kara [name] Treating Kara [name]/Extender: Kara [name] in Treatment: 0 Visit Information Patient Arrived: Ambulatory Arrival Time: 09:37 Accompanied By: self Transfer Assistance: None Patient Identification Verified: Yes Secondary Verification Process Yes Completed: Patient Has Alerts: Yes Patient Alerts: Patient on Blood Thinner Warfarin DMII Electronic Signature(s) Signed: 01/03/2019 3:05:31 PM By: Kara [name] Entered By: Kara [name] on 01/03/2019 09:40:54 [name], [name] ([id number]) -------------------------------------------------------------------------------- Clinic Level of Care Assessment Details Patient Name: [name], [name] Date of Service: 01/03/2019 9:45 AM Medical Record Number: [id number] Patient Account Number: [id number] Date of Birth/Sex: May 20, 1951 (68 y.o.  F) Treating RN: Kara [name] Primary Care Cola Gane: Kara [name] Other Clinician: Referring Kara [name]: Kara [name] Treating Kara [name]/Extender: Kara [name] in Treatment: 0 Clinic Level of Care Assessment Items TOOL 1 Quantity Score []  - Use when EandM and Procedure is performed on INITIAL visit 0 ASSESSMENTS - Nursing Assessment / Reassessment X - General Physical Exam (combine w/ comprehensive assessment (listed just below) when 1 20 performed on new pt. evals) X- 1 25 Comprehensive Assessment (HX, ROS, Risk Assessments, Wounds Hx, etc.) ASSESSMENTS - Wound and Skin Assessment / Reassessment []  - Dermatologic / Skin Assessment (not related to wound area) 0 ASSESSMENTS - Ostomy and/or Continence Assessment and Care []  - Incontinence Assessment and Management 0 []  - 0 Ostomy Care Assessment and Management (repouching, etc.) PROCESS - Coordination of Care X - Simple Patient / Family Education for ongoing care 1 15 []  - 0 Complex (extensive) Patient / Family Education for ongoing care X- 1 10 Staff obtains Programmer, systems, Records, Test Results / Process Orders []  - 0 Staff telephones HHA, Nursing Homes / Clarify orders / etc []  - 0 Routine Transfer to another Facility (non-emergent condition) []  - 0 Routine Hospital Admission (non-emergent condition) X- 1 15 New Admissions / Biomedical engineer / Ordering NPWT, Apligraf, etc. []  - 0 Emergency Hospital Admission (emergent condition) PROCESS - Special Needs []  - Pediatric / Minor Patient Management 0 []  - 0 Isolation Patient Management []  - 0 Hearing / Language / Visual special needs []  - 0 Assessment of Community assistance (transportation, D/C planning, etc.) []  - 0 Additional assistance / Altered mentation []  - 0 Support Surface(s) Assessment (bed, cushion, seat, etc.) [name], [name] ([id number]) INTERVENTIONS - Miscellaneous []  - External ear exam 0 []  - 0 Patient Transfer (multiple staff /  Civil Service fast streamer / Similar devices) []  - 0 Simple Staple / Suture removal (25 or less) []  - 0 Complex Staple / Suture removal (26 or more) []  - 0 Hypo/Hyperglycemic Management (do not check if billed separately) X- 1 15 Ankle / Brachial Index (ABI) - do not check if billed separately Has the patient been seen at the hospital within the last three years: Yes Total Score:  100 Level Of Care: New/Established - Level 3 Electronic Signature(s) Signed: 01/03/2019 5:00:13 PM By: Kara [name], BSN, RN, CWS, Kim RN, BSN Entered By: Kara [name], BSN, RN, CWS, Kim on 01/03/2019 10:22:48 [name], [name] ([id number]) -------------------------------------------------------------------------------- Encounter Discharge Information Details Patient Name: [name], [name] Date of Service: 01/03/2019 9:45 AM Medical Record Number: [id number] Patient Account Number: [id number] Date of Birth/Sex: May 14, 1951 (68 y.o. F) Treating RN: Kara [name] Primary Care Kara [name]: Kara [name] Other Clinician: Referring Kara [name]: Kara [name] Treating Kara [name]/Extender: Kara [name] in Treatment: 0 Encounter Discharge Information Items Post Procedure Vitals Discharge Condition: Stable Temperature (F): 98.2 Ambulatory Status: Ambulatory Pulse (bpm): 84 Discharge Destination: Home Respiratory Rate (breaths/min): 16 Transportation: Private Auto Blood Pressure (mmHg): 154/60 Accompanied By: self Schedule Follow-up Appointment: Yes Clinical Summary of Care: Electronic Signature(s) Signed: 01/03/2019 5:00:13 PM By: Kara [name], BSN, RN, CWS, Kim RN, BSN Entered By: Kara [name], BSN, RN, CWS, Kim on 01/03/2019 10:25:36 [place], Kara [name]. ([id number]) -------------------------------------------------------------------------------- Lower Extremity Assessment Details Patient Name: [name], [name] Date of Service: 01/03/2019 9:45 AM Medical Record Number: [id number] Patient Account Number: [id number] Date  of Birth/Sex: 10-Dec-1950 (68 y.o. F) Treating RN: Kara [name] Primary Care Jetaime Pinnix: Kara [name] Other Clinician: Referring Rieley Khalsa: Kara [name] Treating Anselm Aumiller/Extender: Kara [name] in Treatment: 0 Edema Assessment Assessed: Shirlyn Goltz: No] [Right: No] Edema: [Left: No] [Right: No] Calf Left: Right: Point of Measurement: 32 cm From Medial Instep 39 cm 39.9 cm Ankle Left: Right: Point of Measurement: 9 cm From Medial Instep 19.7 cm 18.7 cm Vascular Assessment Pulses: Dorsalis Pedis Palpable: [Left:Yes] [Right:Yes] Doppler Audible: [Left:Yes] [Right:Yes] Posterior Tibial Palpable: [Left:Yes] [Right:Yes] Doppler Audible: [Left:Yes] [Right:Yes] Blood Pressure: Brachial: [Left:154] Dorsalis Pedis: 165 [Left:Dorsalis Pedis: 150] Ankle: Posterior Tibial: 150 [Left:Posterior Tibial: 160 1.07] [Right:1.04] Electronic Signature(s) Signed: 01/03/2019 3:05:31 PM By: Kara [name] Entered By: Kara [name] on 01/03/2019 09:52:21 JACKSON-Vitanza, Valerie K. ([id number]) -------------------------------------------------------------------------------- Multi Wound Chart Details Patient Name: [name], [name] Date of Service: 01/03/2019 9:45 AM Medical Record Number: [id number] Patient Account Number: [id number] Date of Birth/Sex: 07-02-1950 (68 y.o. F) Treating RN: Kara [name] Primary Care Kenyon Eichelberger: Kara [name] Other Clinician: Referring Jagger Beahm: Kara [name] Treating Demira Gwynne/Extender: Kara [name] in Treatment: 0 Vital Signs Height(in): 63 Pulse(bpm): 20 Weight(lbs): 227 Blood Pressure(mmHg): 154/60 Body Mass Index(BMI): 40 Temperature(F): 98.2 Respiratory Rate 16 (breaths/min): Photos: [N/A:N/A] Wound Location: Left Lower Leg - Anterior N/A N/A Wounding Event: Trauma N/A N/A Primary Etiology: Diabetic Wound/Ulcer of the N/A N/A Lower Extremity Secondary Etiology: Trauma, Other N/A N/A Comorbid History: Congestive Heart Failure,  N/A N/A Deep Vein Thrombosis, Hypertension, Type II Diabetes, Osteoarthritis, Neuropathy Date Acquired: 12/21/2018 N/A N/A Weeks of Treatment: 0 N/A N/A Wound Status: Open N/A N/A Measurements L x W x D 3.8x1.8x0.7 N/A N/A (cm) Area (cm) : 5.372 N/A N/A Volume (cm) : 3.76 N/A N/A Classification: Grade 1 N/A N/A Exudate Amount: Medium N/A N/A Exudate Type: Serous N/A N/A Exudate Color: amber N/A N/A Wound Margin: Flat and Intact N/A N/A Granulation Amount: Small (1-33%) N/A N/A Granulation Quality: Pink N/A N/A Necrotic Amount: Large (67-100%) N/A N/A Necrotic Tissue: Eschar, Adherent Slough N/A N/A Exposed Structures: Fat Layer (Subcutaneous N/A N/A Tissue) Exposed: Yes Fascia: No JACKSON-Liew, Kara [name] K. ([id number]) Tendon: No Muscle: No Joint: No Bone: No Epithelialization: None N/A N/A Treatment Notes Electronic Signature(s) Signed: 01/03/2019 5:00:13 PM By: Kara [name], BSN, RN, CWS, Kim RN, BSN Entered By: Kara [name], BSN, RN, CWS, Kim on 01/03/2019 10:13:07 JACKSON-Vandermeulen, Dollene K. ([id number]) -------------------------------------------------------------------------------- Multi-Disciplinary Care Plan Details Patient Name: [name], [name]  Date of Service: 01/03/2019 9:45 AM Medical Record Number: [id number] Patient Account Number: [id number] Date of Birth/Sex: Sep 10, 1950 (68 y.o. F) Treating RN: Kara [name] Primary Care Nasteho Glantz: Kara [name] Other Clinician: Referring Ciaira Natividad: Kara [name] Treating Wilhelm Ganaway/Extender: Kara [name] in Treatment: 0 Active Inactive Abuse / Safety / Falls / Self Care Management Nursing Diagnoses: History of Falls Goals: Patient will not experience any injury related to falls Date Initiated: 01/03/2019 Target Resolution Date: 01/17/2019 Goal Status: Active Interventions: Assess fall risk on admission and as needed Notes: Medication Nursing Diagnoses: Knowledge deficit related to medication safety: actual or  potential Goals: Patient/caregiver will demonstrate understanding of all current medications Date Initiated: 01/03/2019 Target Resolution Date: 01/03/2019 Goal Status: Active Interventions: Patient/Caregiver given reconciled medication list upon admission, changes in medications and discharge from the [place] Notes: Necrotic Tissue Nursing Diagnoses: Impaired tissue integrity related to necrotic/devitalized tissue Goals: Necrotic/devitalized tissue will be minimized in the wound bed Date Initiated: 01/03/2019 Target Resolution Date: 01/10/2019 Goal Status: Active Interventions: JACKSON-Tallon, Larissa K. ([id number]) Assess patient pain level pre-, during and post procedure and prior to discharge Treatment Activities: Apply topical anesthetic as ordered : 01/03/2019 Excisional debridement : 01/03/2019 Notes: Wound/Skin Impairment Nursing Diagnoses: Impaired tissue integrity Goals: Ulcer/skin breakdown will have a volume reduction of 30% by week 4 Date Initiated: 01/03/2019 Target Resolution Date: 02/03/2019 Goal Status: Active Interventions: Assess patient/caregiver ability to obtain necessary supplies Assess ulceration(s) every visit Treatment Activities: Skin care regimen initiated : 01/03/2019 Topical wound management initiated : 01/03/2019 Notes: Electronic Signature(s) Signed: 01/03/2019 5:00:13 PM By: Kara [name], BSN, RN, CWS, Kim RN, BSN Entered By: Kara [name], BSN, RN, CWS, Kim on 01/03/2019 10:12:32 JACKSON-Seitzinger, Falan K. ([id number]) -------------------------------------------------------------------------------- Pain Assessment Details Patient Name: JACKSON-Puchalski, Kristeena K. Date of Service: 01/03/2019 9:45 AM Medical Record Number: [id number] Patient Account Number: [id number] Date of Birth/Sex: [birth year] (68 y.o. F) Treating RN: Kara [name] Primary Care Arthur Aydelotte: Kara [name] Other Clinician: Referring Nawaal Alling: Kara [name] Treating Jc Veron/Extender: Kara [name] in Treatment: 0 Active Problems Location of Pain Severity and Description of Pain Patient Has Paino No Site Locations Pain Management and Medication Current Pain Management: Electronic Signature(s) Signed: 01/03/2019 3:56:16 PM By: Paulla Fore, RRT, CHT Signed: 01/03/2019 5:00:13 PM By: Kara [name], BSN, RN, CWS, Kim RN, BSN Entered By: Lorine Bears on 01/03/2019 [place], Harlow Raliegh Ip ([id number]) -------------------------------------------------------------------------------- Patient/Caregiver Education Details Patient Name: JACKSON-[place]aco, Shaquanta K. Date of Service: 01/03/2019 9:45 AM Medical Record Number: [id number] Patient Account Number: [id number] Date of Birth/Gender: Apr 05, 1951 (68 y.o. F) Treating RN: Kara [name] Primary Care Physician: Kara [name] Other Clinician: Referring Physician: Mortimer [name] Treating Physician/Extender: Kara [name] in Treatment: 0 Education Assessment Education Provided To: Patient Education Topics Provided Venous: Handouts: Controlling Swelling with Multilayered Compression Wraps Methods: Demonstration, Explain/Verbal Responses: State content correctly Wound/Skin Impairment: Handouts: Caring for Your Ulcer Methods: Demonstration, Explain/Verbal Responses: State content correctly Electronic Signature(s) Signed: 01/03/2019 5:00:13 PM By: Kara [name], BSN, RN, CWS, Kim RN, BSN Entered By: Kara [name], BSN, RN, CWS, Kim on 01/03/2019 10:23:15 JACKSON-Nair, Rozlynn K. ([id number]) -------------------------------------------------------------------------------- Wound Assessment Details Patient Name: JACKSON-Boehlke, Loredana K. Date of Service: 01/03/2019 9:45 AM Medical Record Number: [id number] Patient Account Number: [id number] Date of Birth/Sex: May 20, 1951 (68 y.o. F) Treating RN: Kara [name] Primary Care Belen Pesch: Kara [name] Other Clinician: Referring Timmi Devora: Kara [name] Treating Bostyn Bogie/Extender: Kara [name] in Treatment: 0 Wound Status Wound Number: 1 Primary Diabetic Wound/Ulcer of the Lower Extremity Etiology: Wound Location: Left Lower Leg - Anterior Secondary Trauma, Other Wounding Event:  Trauma Etiology: Date Acquired: 12/21/2018 Wound Open Weeks Of Treatment: 0 Status: Clustered Wound: No Comorbid Congestive Heart Failure, Deep Vein History: Thrombosis, Hypertension, Type II Diabetes, Osteoarthritis, Neuropathy Photos Wound Measurements Length: (cm) 3.8 % Reduction Width: (cm) 1.8 % Reduction Depth: (cm) 0.7 Epitheliali Area: (cm) 5.372 Tunneling: Volume: (cm) 3.76 Underminin in Area: in Volume: zation: None No g: No Wound Description Classification: Grade 1 Foul Odor A Wound Margin: Flat and Intact Slough/Fibr Exudate Amount: Medium Exudate Type: Serous Exudate Color: amber fter Cleansing: No ino Yes Wound Bed Granulation Amount: Small (1-33%) Exposed Structure Granulation Quality: Pink Fascia Exposed: No Necrotic Amount: Large (67-100%) Fat Layer (Subcutaneous Tissue) Exposed: Yes Necrotic Quality: Eschar, Adherent Slough Tendon Exposed: No Muscle Exposed: No Joint Exposed: No Bone Exposed: No JACKSON-Foxworthy, Akiera K. ([id number]) Treatment Notes Wound #1 (Left, Anterior Lower Leg) Notes Left: Sliver Alginate, abd, 3layer unna to anchor; Right-ace wrap. Electronic Signature(s) Signed: 01/03/2019 3:05:31 PM By: Kara [name] Entered By: Kara [name] on 01/03/2019 09:50:42 JACKSON-Wyffels, Charrie K. ([id number]) -------------------------------------------------------------------------------- Vitals Details Patient Name: JACKSON-Knape, Natividad K. Date of Service: 01/03/2019 9:45 AM Medical Record Number: [id number] Patient Account Number: [id number] Date of Birth/Sex: 1951-01-14 (68 y.o. F) Treating RN: Kara [name] Primary Care Natilee Gauer: Kara [name] Other Clinician: Referring Dareion Kneece:  Kara [name] Treating Sherrelle Prochazka/Extender: Kara [name] in Treatment: 0 Vital Signs Time Taken: 09:35 Temperature (F): 98.2 Height (in): 63 Pulse (bpm): 84 Source: Stated Respiratory Rate (breaths/min): 16 Weight (lbs): 227 Blood Pressure (mmHg): 154/60 Source: Measured Reference Range: 80 - 120 mg / dl Body Mass Index (BMI): 40.2 Electronic Signature(s) Signed: 01/03/2019 3:56:16 PM By: Lorine Bears RCP, RRT, CHT Entered By: Lorine Bears on 01/03/2019 09:39:23

## 2019-01-08 DIAGNOSIS — Z7901 Long term (current) use of anticoagulants: Secondary | ICD-10-CM | POA: Diagnosis not present

## 2019-01-10 ENCOUNTER — Encounter: Payer: PPO | Admitting: Internal Medicine

## 2019-01-10 ENCOUNTER — Other Ambulatory Visit: Payer: Self-pay

## 2019-01-10 DIAGNOSIS — E11622 Type 2 diabetes mellitus with other skin ulcer: Secondary | ICD-10-CM | POA: Diagnosis not present

## 2019-01-10 DIAGNOSIS — L97822 Non-pressure chronic ulcer of other part of left lower leg with fat layer exposed: Secondary | ICD-10-CM | POA: Diagnosis not present

## 2019-01-11 NOTE — Progress Notes (Signed)
KIMMI, ACOCELLA ([id number]) Visit Report for 01/10/2019 Arrival Information Details Patient Name: [name], [name] Date of Service: 01/10/2019 9:30 AM Medical Record Number: [id number] Patient Account Number: [id number] Date of Birth/Sex: May 31, 1950 (68 y.o. F) Treating RN: Cornell Barman Primary Care Coreon Simkins: Tracie Harrier Other Clinician: Referring Ozetta Flatley: Tracie Harrier Treating Malikye Reppond/Extender: Tito Dine in Treatment: 1 Visit Information History Since Last Visit Added or deleted any medications: No Patient Arrived: Cane Any new allergies or adverse reactions: No Arrival Time: 09:26 Had a fall or experienced change in No Accompanied By: self activities of daily living that may affect Transfer Assistance: None risk of falls: Patient Identification Verified: Yes Signs or symptoms of abuse/neglect since last visito No Secondary Verification Process Yes Hospitalized since last visit: No Completed: Implantable device outside of the clinic excluding No Patient Has Alerts: Yes cellular tissue based products placed in the center Patient Alerts: Patient on Blood since last visit: Thinner Has Dressing in Place as Prescribed: Yes Warfarin Pain Present Now: No DMII Electronic Signature(s) Signed: 01/10/2019 4:09:28 PM By: Lorine Bears RCP, RRT, CHT Entered By: Becky Sax, Amado Nash on 01/10/2019 09:28:01 JACKSON-Dery, Waneta K. ([id number]) -------------------------------------------------------------------------------- Encounter Discharge Information Details Patient Name: JACKSON-Arons, Aneita K. Date of Service: 01/10/2019 9:30 AM Medical Record Number: [id number] Patient Account Number: [id number] Date of Birth/Sex: [birth year] (68 y.o. F) Treating RN: Cornell Barman Primary Care Ayven Pheasant: Tracie Harrier Other Clinician: Referring Madgie Dhaliwal: Tracie Harrier Treating Jaquann Guarisco/Extender: Tito Dine in  Treatment: 1 Encounter Discharge Information Items Post Procedure Vitals Discharge Condition: Stable Temperature (F): 97.7 Ambulatory Status: Ambulatory Pulse (bpm): 85 Discharge Destination: Home Respiratory Rate (breaths/min): 18 Transportation: Private Auto Blood Pressure (mmHg): 123/62 Accompanied By: self Schedule Follow-up Appointment: Yes Clinical Summary of Care: Electronic Signature(s) Signed: 01/10/2019 11:59:43 AM By: Gretta Cool, BSN, RN, CWS, Kim RN, BSN Entered By: Gretta Cool, BSN, RN, CWS, Kim on 01/10/2019 09:57:55 JACKSON-Tiley, Elis K. ([id number]) -------------------------------------------------------------------------------- Lower Extremity Assessment Details Patient Name: JACKSON-Kadar, Cintia K. Date of Service: 01/10/2019 9:30 AM Medical Record Number: [id number] Patient Account Number: [id number] Date of Birth/Sex: 1951/04/30 (68 y.o. F) Treating RN: Montey Hora Primary Care Almon Whitford: Tracie Harrier Other Clinician: Referring Naseem Adler: Tracie Harrier Treating Amariona Rathje/Extender: Ricard Dillon Weeks in Treatment: 1 Edema Assessment Assessed: [Left: No] [Right: No] Edema: [Left: N] [Right: o] Calf Left: Right: Point of Measurement: 32 cm From Medial Instep 38 cm cm Ankle Left: Right: Point of Measurement: 9 cm From Medial Instep 19.5 cm cm Vascular Assessment Pulses: Dorsalis Pedis Palpable: [Left:Yes] Electronic Signature(s) Signed: 01/10/2019 4:23:45 PM By: Montey Hora Entered By: Montey Hora on 01/10/2019 09:35:00 JACKSON-Hehir, Jazel K. ([id number]) -------------------------------------------------------------------------------- Multi Wound Chart Details Patient Name: JACKSON-Mitton, Titilayo K. Date of Service: 01/10/2019 9:30 AM Medical Record Number: [id number] Patient Account Number: [id number] Date of Birth/Sex: 11/16/1950 (68 y.o. F) Treating RN: Cornell Barman Primary Care Lacorey Brusca: Tracie Harrier Other Clinician: Referring  Tatijana Bierly: Tracie Harrier Treating Lavon Horn/Extender: Tito Dine in Treatment: 1 Vital Signs Height(in): 63 Pulse(bpm): 85 Weight(lbs): 227 Blood Pressure(mmHg): 123/62 Body Mass Index(BMI): 40 Temperature(F): 97.7 Respiratory Rate 16 (breaths/min): Photos: [N/A:N/A] Wound Location: Left Lower Leg - Anterior N/A N/A Wounding Event: Trauma N/A N/A Primary Etiology: Diabetic Wound/Ulcer of the N/A N/A Lower Extremity Secondary Etiology: Trauma, Other N/A N/A Comorbid History: Congestive Heart Failure, N/A N/A Deep Vein Thrombosis, Hypertension, Type II Diabetes, Osteoarthritis, Neuropathy Date Acquired: 12/21/2018 N/A N/A Weeks of Treatment: 1 N/A N/A Wound Status: Open N/A N/A Measurements L x W x D 3.7x1.5x0.7 N/A N/A (  cm) Area (cm) : 4.359 N/A N/A Volume (cm) : 3.051 N/A N/A % Reduction in Area: 18.90% N/A N/A % Reduction in Volume: 18.90% N/A N/A Classification: Grade 1 N/A N/A Exudate Amount: Large N/A N/A Exudate Type: Serosanguineous N/A N/A Exudate Color: red, brown N/A N/A Wound Margin: Flat and Intact N/A N/A Granulation Amount: Medium (34-66%) N/A N/A Granulation Quality: Pink N/A N/A Necrotic Amount: Medium (34-66%) N/A N/A Exposed Structures: Fat Layer (Subcutaneous N/A N/A Tissue) Exposed: Yes JACKSON-Hartin, Catherene K. ([id number]) Fascia: No Tendon: No Muscle: No Joint: No Bone: No Epithelialization: None N/A N/A Debridement: Debridement - Excisional N/A N/A Pre-procedure 09:50 N/A N/A Verification/Time Out Taken: Pain Control: Lidocaine N/A N/A Tissue Debrided: Fat, Subcutaneous, Slough N/A N/A Level: Skin/Subcutaneous Tissue N/A N/A Debridement Area (sq cm): 5.55 N/A N/A Instrument: Curette N/A N/A Bleeding: Minimum N/A N/A Hemostasis Achieved: Pressure N/A N/A Debridement Treatment Procedure was tolerated well N/A N/A Response: Post Debridement 3.7x1.7x0.7 N/A N/A Measurements L x W x D (cm) Post Debridement Volume:  3.458 N/A N/A (cm) Procedures Performed: Debridement N/A N/A Treatment Notes Wound #1 (Left, Anterior Lower Leg) Notes Left: Sliver Alginate, x-sorb, 3layer unna to anchor; Right-ace wrap. Electronic Signature(s) Signed: 01/10/2019 5:33:48 PM By: Linton Ham MD Entered By: Linton Ham on 01/10/2019 10:21:35 JACKSON-Warne, Fawnda Raliegh Ip ([id number]) -------------------------------------------------------------------------------- [place] Details Patient Name: JACKSON-Keitt, Tariah K. Date of Service: 01/10/2019 9:30 AM Medical Record Number: [id number] Patient Account Number: [id number] Date of Birth/Sex: 02-Aug-1950 (68 y.o. F) Treating RN: Cornell Barman Primary Care Jackolyn Geron: Tracie Harrier Other Clinician: Referring Dajae Kizer: Tracie Harrier Treating Cali Cuartas/Extender: Tito Dine in Treatment: 1 Active Inactive Abuse / Safety / Falls / Self Care Management Nursing Diagnoses: History of Falls Goals: Patient will not experience any injury related to falls Date Initiated: 01/03/2019 Target Resolution Date: 01/17/2019 Goal Status: Active Interventions: Assess fall risk on admission and as needed Notes: Medication Nursing Diagnoses: Knowledge deficit related to medication safety: actual or potential Goals: Patient/caregiver will demonstrate understanding of all current medications Date Initiated: 01/03/2019 Target Resolution Date: 01/03/2019 Goal Status: Active Interventions: Patient/Caregiver given reconciled medication list upon admission, changes in medications and discharge from the [place] Notes: Necrotic Tissue Nursing Diagnoses: Impaired tissue integrity related to necrotic/devitalized tissue Goals: Necrotic/devitalized tissue will be minimized in the wound bed Date Initiated: 01/03/2019 Target Resolution Date: 01/10/2019 Goal Status: Active Interventions: JACKSON-Highland, Jamae K. ([id number]) Assess patient pain level  pre-, during and post procedure and prior to discharge Treatment Activities: Apply topical anesthetic as ordered : 01/03/2019 Excisional debridement : 01/03/2019 Notes: Wound/Skin Impairment Nursing Diagnoses: Impaired tissue integrity Goals: Ulcer/skin breakdown will have a volume reduction of 30% by week 4 Date Initiated: 01/03/2019 Target Resolution Date: 02/03/2019 Goal Status: Active Interventions: Assess patient/caregiver ability to obtain necessary supplies Assess ulceration(s) every visit Treatment Activities: Skin care regimen initiated : 01/03/2019 Topical wound management initiated : 01/03/2019 Notes: Electronic Signature(s) Signed: 01/10/2019 11:59:43 AM By: Gretta Cool, BSN, RN, CWS, Kim RN, BSN Entered By: Gretta Cool, BSN, RN, CWS, Kim on 01/10/2019 09:52:02 JACKSON-Gondek, Ulani K. ([id number]) -------------------------------------------------------------------------------- Pain Assessment Details Patient Name: JACKSON-Gosa, Janki K. Date of Service: 01/10/2019 9:30 AM Medical Record Number: [id number] Patient Account Number: [id number] Date of Birth/Sex: [birth year] (68 y.o. F) Treating RN: Cornell Barman Primary Care Fredick Schlosser: Tracie Harrier Other Clinician: Referring Jamichael Knotts: Tracie Harrier Treating Mirna Sutcliffe/Extender: Tito Dine in Treatment: 1 Active Problems Location of Pain Severity and Description of Pain Patient Has Paino No Site Locations Pain Management and Medication Current Pain Management:  Electronic Signature(s) Signed: 01/10/2019 11:59:43 AM By: Gretta Cool, BSN, RN, CWS, Kim RN, BSN Signed: 01/10/2019 4:09:28 PM By: Lorine Bears RCP, RRT, CHT Entered By: Lorine Bears on 01/10/2019 09:28:11 JACKSON-Ewton, Sible KMarland Kitchen ([id number]) -------------------------------------------------------------------------------- Patient/Caregiver Education Details Patient Name: JACKSON-Padin, Issis K. Date of Service: 01/10/2019 9:30  AM Medical Record Number: [id number] Patient Account Number: [id number] Date of Birth/Gender: 09-22-1950 (68 y.o. F) Treating RN: Cornell Barman Primary Care Physician: Tracie Harrier Other Clinician: Referring Physician: Tracie Harrier Treating Physician/Extender: Tito Dine in Treatment: 1 Education Assessment Education Provided To: Patient Education Topics Provided Venous: Handouts: Controlling Swelling with Multilayered Compression Wraps Methods: Demonstration, Explain/Verbal Responses: State content correctly Wound/Skin Impairment: Handouts: Caring for Your Ulcer Methods: Demonstration, Explain/Verbal Responses: State content correctly Electronic Signature(s) Signed: 01/10/2019 11:59:43 AM By: Gretta Cool, BSN, RN, CWS, Kim RN, BSN Entered By: Gretta Cool, BSN, RN, CWS, Kim on 01/10/2019 09:56:25 JACKSON-Finamore, Takiesha K. ([id number]) -------------------------------------------------------------------------------- Wound Assessment Details Patient Name: JACKSON-Arp, Sherena K. Date of Service: 01/10/2019 9:30 AM Medical Record Number: [id number] Patient Account Number: [id number] Date of Birth/Sex: September 16, 1950 (68 y.o. F) Treating RN: Montey Hora Primary Care Antonisha Waskey: Tracie Harrier Other Clinician: Referring Makinzee Durley: Tracie Harrier Treating Chau Sawin/Extender: Ricard Dillon Weeks in Treatment: 1 Wound Status Wound Number: 1 Primary Diabetic Wound/Ulcer of the Lower Extremity Etiology: Wound Location: Left Lower Leg - Anterior Secondary Trauma, Other Wounding Event: Trauma Etiology: Date Acquired: 12/21/2018 Wound Open Weeks Of Treatment: 1 Status: Clustered Wound: No Comorbid Congestive Heart Failure, Deep Vein History: Thrombosis, Hypertension, Type II Diabetes, Osteoarthritis, Neuropathy Photos Wound Measurements Length: (cm) 3.7 % Reduction i Width: (cm) 1.5 % Reduction i Depth: (cm) 0.7 Epithelializa Area: (cm) 4.359 Tunneling: Volume:  (cm) 3.051 Undermining: n Area: 18.9% n Volume: 18.9% tion: None No No Wound Description Classification: Grade 1 Foul Odor Aft Wound Margin: Flat and Intact Slough/Fibrin Exudate Amount: Large Exudate Type: Serosanguineous Exudate Color: red, brown er Cleansing: No o Yes Wound Bed Granulation Amount: Medium (34-66%) Exposed Structure Granulation Quality: Pink Fascia Exposed: No Necrotic Amount: Medium (34-66%) Fat Layer (Subcutaneous Tissue) Exposed: Yes Necrotic Quality: Adherent Slough Tendon Exposed: No Muscle Exposed: No Joint Exposed: No Bone Exposed: No JACKSON-Schlarb, Danise K. ([id number]) Treatment Notes Wound #1 (Left, Anterior Lower Leg) Notes Left: Sliver Alginate, x-sorb, 3layer unna to anchor; Right-ace wrap. Electronic Signature(s) Signed: 01/10/2019 4:23:45 PM By: Montey Hora Entered By: Montey Hora on 01/10/2019 09:39:36 JACKSON-Sako, Tilla K. ([id number]) -------------------------------------------------------------------------------- Vitals Details Patient Name: JACKSON-Estill, Fallon K. Date of Service: 01/10/2019 9:30 AM Medical Record Number: [id number] Patient Account Number: [id number] Date of Birth/Sex: 1950-06-18 (68 y.o. F) Treating RN: Cornell Barman Primary Care Sonal Dorwart: Tracie Harrier Other Clinician: Referring Murad Staples: Tracie Harrier Treating Tyriq Moragne/Extender: Tito Dine in Treatment: 1 Vital Signs Time Taken: 09:25 Temperature (F): 97.7 Height (in): 63 Pulse (bpm): 85 Weight (lbs): 227 Respiratory Rate (breaths/min): 16 Body Mass Index (BMI): 40.2 Blood Pressure (mmHg): 123/62 Reference Range: 80 - 120 mg / dl Electronic Signature(s) Signed: 01/10/2019 4:09:28 PM By: Lorine Bears RCP, RRT, CHT Entered By: Becky Sax, Amado Nash on 01/10/2019 09:28:40

## 2019-01-11 NOTE — Progress Notes (Signed)
KATORI, WIRSING ([id number]) Visit Report for 01/10/2019 Debridement Details Patient Name: JACKSON-Supple, Laityn K. Date of Service: 01/10/2019 9:30 AM Medical Record Number: [id number] Patient Account Number: [id number] Date of Birth/Sex: September 06, 1950 (68 y.o. F) Treating RN: Cornell Barman Primary Care Provider: Tracie Harrier Other Clinician: Referring Provider: Tracie Harrier Treating Provider/Extender: Tito Dine in Treatment: 1 Debridement Performed for Wound #1 Left,Anterior Lower Leg Assessment: Performed By: Physician Ricard Dillon, MD Debridement Type: Debridement Severity of Tissue Pre Fat layer exposed Debridement: Level of Consciousness (Pre- Awake and Alert procedure): Pre-procedure Verification/Time Yes - 09:50 Out Taken: Start Time: 09:50 Pain Control: Lidocaine Total Area Debrided (L x W): 3.7 (cm) x 1.5 (cm) = 5.55 (cm) Tissue and other material Viable, Non-Viable, Fat, Slough, Subcutaneous, Slough debrided: Level: Skin/Subcutaneous Tissue Debridement Description: Excisional Instrument: Curette Bleeding: Minimum Hemostasis Achieved: Pressure End Time: 09:54 Response to Treatment: Procedure was tolerated well Level of Consciousness Awake and Alert (Post-procedure): Post Debridement Measurements of Total Wound Length: (cm) 3.7 Width: (cm) 1.7 Depth: (cm) 0.7 Volume: (cm) 3.458 Character of Wound/Ulcer Post Debridement: Stable Severity of Tissue Post Debridement: Fat layer exposed Post Procedure Diagnosis Same as Pre-procedure Electronic Signature(s) Signed: 01/10/2019 11:59:43 AM By: Gretta Cool, BSN, RN, CWS, Kim RN, BSN Signed: 01/10/2019 5:33:48 PM By: Linton Ham MD Entered By: Linton Ham on 01/10/2019 10:21:51 JACKSON-Dejonge, Prisilla K. ([id number]) -------------------------------------------------------------------------------- HPI Details Patient Name: JACKSON-Heier, Janasha K. Date of Service: 01/10/2019 9:30  AM Medical Record Number: [id number] Patient Account Number: [id number] Date of Birth/Sex: 1950/06/15 (68 y.o. F) Treating RN: Cornell Barman Primary Care Provider: Tracie Harrier Other Clinician: Referring Provider: Tracie Harrier Treating Provider/Extender: Tito Dine in Treatment: 1 History of Present Illness HPI Description: Pleasant 69 year old Caucasian female who fell down a few steps in her home 2 weeks ago and developed bruises on both legs on the shins, on the left shin area she did have an open area that scabbed, patient did ED on 8/1 at which point she had dressing applied to the left shin wound, she had a right shin hematoma that was lanced with oozing of blood after. Since that time patient has noticed that the left calf leg wound developed a black covering on top and had started to hurt, she has been dressing this daily with an antibiotic ointment and gauze wrap. 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 She had a traumatic wound on the left lower extremity. We have been using silver alginate because of drainage. She has been in compression Electronic Signature(s) Signed: 01/10/2019 5:33:48 PM By: Linton Ham MD Entered By: Linton Ham on 01/10/2019 10:22:57 JACKSON-Markuson, Peta K. ([id number]) -------------------------------------------------------------------------------- Physical Exam Details Patient Name:  JACKSON-Culotta, Tobin K. Date of Service: 01/10/2019 9:30 AM Medical Record Number: [id number] Patient Account Number: [id number] Date of Birth/Sex: 09-03-1950 (68 y.o. F) Treating RN: Cornell Barman Primary Care Provider: Tracie Harrier Other Clinician: Referring Provider: Tracie Harrier Treating Provider/Extender: Linton Ham  G Weeks in Treatment: 1 Constitutional Sitting or standing Blood Pressure is within target range for patient.. Pulse regular and within target range for patient.Marland Kitchen Respirations regular, non-labored and within target range.. Temperature is normal and within the target range for the patient.Marland Kitchen appears in no distress. Cardiovascular Pedal pulses palpable on the left. Notes Wound exam; left anterior leg wound. The inferior half of this wound is largely fully granulated where is the top calf has more depth. It is top after it still requires debridement for remaining necrotic material. I used a #5 curette this cleans up quite nicely. Hemostasis with direct pressure. There is no evidence of infection here. Electronic Signature(s) Signed: 01/10/2019 5:33:48 PM By: Linton Ham MD Entered By: Linton Ham on 01/10/2019 10:25:17 JACKSON-Schendel, Arionne K. ([id number]) -------------------------------------------------------------------------------- Physician Orders Details Patient Name: JACKSON-Harwick, Mazey K. Date of Service: 01/10/2019 9:30 AM Medical Record Number: [id number] Patient Account Number: [id number] Date of Birth/Sex: 10/05/1950 (68 y.o. F) Treating RN: Cornell Barman Primary Care Provider: Tracie Harrier Other Clinician: Referring Provider: Tracie Harrier Treating Provider/Extender: Tito Dine in Treatment: 1 Verbal / Phone Orders: No Diagnosis Coding Wound Cleansing Wound #1 Left,Anterior Lower Leg o May shower with protection. Anesthetic (add to Medication List) Wound #1 Left,Anterior Lower Leg o Topical Lidocaine 4% cream  applied to wound bed prior to debridement (In Clinic Only). o Benzocaine Topical Anesthetic Spray applied to wound bed prior to debridement (In Clinic Only). Primary Wound Dressing Wound #1 Left,Anterior Lower Leg o Silver Alginate Secondary Dressing Wound #1 Left,Anterior Lower Leg o Other - Xtrasorb Dressing Change Frequency Wound #1 Left,Anterior Lower Leg o Change dressing every week Follow-up Appointments Wound #1 Left,Anterior Lower Leg o Return Appointment in 1 week. o Nurse Visit as needed Edema Control Wound #1 Left,Anterior Lower Leg o 3 Layer Compression System - Left Lower Extremity o Elevate legs to the level of the heart and pump ankles as often as possible o Other: - Ace Wrap right Electronic Signature(s) Signed: 01/10/2019 11:59:43 AM By: Gretta Cool, BSN, RN, CWS, Kim RN, BSN Signed: 01/10/2019 5:33:48 PM By: Linton Ham MD Entered By: Gretta Cool, BSN, RN, CWS, Kim on 01/10/2019 09:55:44 Kreamer, Starlena K. ([id number]) -------------------------------------------------------------------------------- Problem List Details Patient Name: JACKSON-Whetsel, Lorinda K. Date of Service: 01/10/2019 9:30 AM Medical Record Number: [id number] Patient Account Number: [id number] Date of Birth/Sex: 04-May-1951 (68 y.o. F) Treating RN: Cornell Barman Primary Care Provider: Tracie Harrier Other Clinician: Referring Provider: Tracie Harrier Treating Provider/Extender: Tito Dine in Treatment: 1 Active Problems ICD-10 Evaluated Encounter Code Description Active Date Today Diagnosis L97.222 Non-pressure chronic ulcer of left calf with fat layer exposed 01/03/2019 No Yes E11.9 Type 2 diabetes mellitus without complications 8/36/6294 No Yes Inactive Problems Resolved Problems Electronic Signature(s) Signed: 01/10/2019 5:33:48 PM By: Linton Ham MD Entered By: Linton Ham on 01/10/2019 10:21:21 JACKSON-How, Jaylen K.  ([id number]) -------------------------------------------------------------------------------- Progress Note Details Patient Name: JACKSON-Shull, Syleena K. Date of Service: 01/10/2019 9:30 AM Medical Record Number: [id number] Patient Account Number: [id number] Date of Birth/Sex: 1951/05/11 (68 y.o. F) Treating RN: Cornell Barman Primary Care Provider: Tracie Harrier Other Clinician: Referring Provider: Tracie Harrier Treating Provider/Extender: Tito Dine in Treatment: 1 Subjective History of Present Illness (HPI) Pleasant 68 year old Caucasian female who fell down a few steps in her home 2 weeks ago and developed bruises on both legs on the shins, on the left shin area she did have an open area that scabbed, patient did ED on 8/1 at which point she had dressing applied to  the left shin wound, she had a right shin hematoma that was lanced with oozing of blood after. Since that time patient has noticed that the left calf leg wound developed a black covering on top and had started to hurt, she has been dressing this daily with an antibiotic ointment and gauze wrap. 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 She had a traumatic wound on the left lower extremity. We have been using silver alginate because of drainage. She has been in compression Objective Constitutional Sitting or standing Blood Pressure is within target range for patient.. Pulse  regular and within target range for patient.Marland Kitchen Respirations regular, non-labored and within target range.. Temperature is normal and within the target range for the patient.Marland Kitchen appears in no distress. Vitals Time Taken: 9:25 AM, Height: 63 in, Weight: 227 lbs, BMI: 40.2, Temperature: 97.7 F, Pulse: 85 bpm, Respiratory Rate: 16 breaths/min, Blood Pressure: 123/62 mmHg. Cardiovascular Pedal pulses palpable on the left. General Notes: Wound exam; left anterior leg wound. The inferior half of this wound is largely fully granulated where is the top calf has more depth. It is top after it still requires debridement for remaining necrotic material. I used a #5 curette this cleans up quite nicely. Hemostasis with direct pressure. There is no evidence of infection here. Integumentary (Hair, Skin) Wound #1 status is Open. Original cause of wound was Trauma. The wound is located on the Left,Anterior Lower Leg. The wound measures 3.7cm length x 1.5cm width x 0.7cm depth; 4.359cm^2 area and 3.051cm^3 volume. There is Fat Layer (Subcutaneous Tissue) Exposed exposed. There is no tunneling or undermining noted. There is a large amount of JACKSON-Kass, Maddyson K. ([id number]) serosanguineous drainage noted. The wound margin is flat and intact. There is medium (34-66%) pink granulation within the wound bed. There is a medium (34-66%) amount of necrotic tissue within the wound bed including Adherent Slough. Assessment Active Problems ICD-10 Non-pressure chronic ulcer of left calf with fat layer exposed Type 2 diabetes mellitus without complications Procedures Wound #1 Pre-procedure diagnosis of Wound #1 is a Diabetic Wound/Ulcer of the Lower Extremity located on the Left,Anterior Lower Leg .Severity of Tissue Pre Debridement is: Fat layer exposed. There was a Excisional Skin/Subcutaneous Tissue Debridement with a total area of 5.55 sq cm performed by Ricard Dillon, MD. With the following  instrument(s): Curette to remove Viable and Non-Viable tissue/material. Material removed includes Fat, Subcutaneous Tissue, and Slough after achieving pain control using Lidocaine. No specimens were taken. A time out was conducted at 09:50, prior to the start of the procedure. A Minimum amount of bleeding was controlled with Pressure. The procedure was tolerated well. Post Debridement Measurements: 3.7cm length x 1.7cm width x 0.7cm depth; 3.458cm^3 volume. Character of Wound/Ulcer Post Debridement is stable. Severity of Tissue Post Debridement is: Fat layer exposed. Post procedure Diagnosis Wound #1: Same as Pre-Procedure Plan Wound Cleansing: Wound #1 Left,Anterior Lower Leg: May shower with protection.  Anesthetic (add to Medication List): Wound #1 Left,Anterior Lower Leg: Topical Lidocaine 4% cream applied to wound bed prior to debridement (In Clinic Only). Benzocaine Topical Anesthetic Spray applied to wound bed prior to debridement (In Clinic Only). Primary Wound Dressing: Wound #1 Left,Anterior Lower Leg: Silver Alginate Secondary Dressing: Wound #1 Left,Anterior Lower Leg: Other - Xtrasorb Dressing Change Frequency: Wound #1 Left,Anterior Lower Leg: Change dressing every week Follow-up Appointments: Wound #1 Left,Anterior Lower Leg: Return Appointment in 1 week. JACKSON-Hnat, Mauriah K. ([id number]) Nurse Visit as needed Edema Control: Wound #1 Left,Anterior Lower Leg: 3 Layer Compression System - Left Lower Extremity Elevate legs to the level of the heart and pump ankles as often as possible Other: - Ace Wrap right 1. I continue with silver alginate because of the drainage 2. I think this patient has lymphedema with dermatosclerosis in the distal one third of her calf. This may have something to do with the drainage. I had some thoughts about increasing her compression although she complained about it enough that I left it in 3 layer for now. She is not eligible for home  health 3. The superior part of this wound has some depth. This is not fill-in she might be a candidate for an advanced treatment product Electronic Signature(s) Signed: 01/10/2019 5:33:48 PM By: Linton Ham MD Entered By: Linton Ham on 01/10/2019 10:27:19 JACKSON-Salsberry, Alaycia K. ([id number]) -------------------------------------------------------------------------------- SuperBill Details Patient Name: JACKSON-Tomaszewski, Oleda K. Date of Service: 01/10/2019 Medical Record Number: [id number] Patient Account Number: [id number] Date of Birth/Sex: [birth year] (68 y.o. F) Treating RN: Cornell Barman Primary Care Provider: Tracie Harrier Other Clinician: Referring Provider: Tracie Harrier Treating Provider/Extender: Tito Dine in Treatment: 1 Diagnosis Coding ICD-10 Codes Code Description [phone] Non-pressure chronic ulcer of left calf with fat layer exposed E11.9 Type 2 diabetes mellitus without complications Facility Procedures CPT4 Code: [id number] Description: 50037 - DEB SUBQ TISSUE 20 SQ CM/< ICD-10 Diagnosis Description L97.222 Non-pressure chronic ulcer of left calf with fat layer expo E11.9 Type 2 diabetes mellitus without complications Modifier: sed Quantity: 1 Physician Procedures CPT4 Code: [id number] Description: 69450 - WC PHYS SUBQ TISS 20 SQ CM ICD-10 Diagnosis Description L97.222 Non-pressure chronic ulcer of left calf with fat layer expo E11.9 Type 2 diabetes mellitus without complications Modifier: sed Quantity: 1 Electronic Signature(s) Signed: 01/10/2019 5:33:48 PM By: Linton Ham MD Entered By: Linton Ham on 01/10/2019 10:27:37

## 2019-01-12 DIAGNOSIS — Z111 Encounter for screening for respiratory tuberculosis: Secondary | ICD-10-CM | POA: Diagnosis not present

## 2019-01-17 ENCOUNTER — Other Ambulatory Visit: Payer: Self-pay

## 2019-01-17 ENCOUNTER — Encounter: Payer: PPO | Admitting: Internal Medicine

## 2019-01-17 DIAGNOSIS — L97822 Non-pressure chronic ulcer of other part of left lower leg with fat layer exposed: Secondary | ICD-10-CM | POA: Diagnosis not present

## 2019-01-17 DIAGNOSIS — E11622 Type 2 diabetes mellitus with other skin ulcer: Secondary | ICD-10-CM | POA: Diagnosis not present

## 2019-01-17 NOTE — Progress Notes (Signed)
Kara [name] ([id number]) Visit Report for 01/17/2019 Arrival Information Details Patient Name: Kara [name], [name] Date of Service: 01/17/2019 9:30 AM Medical Record Number: [id number] Patient Account Number: [id number] Date of Birth/Sex: [birth year] (68 y.o. F) Treating RN: Army Melia Primary Care Garlin Batdorf: Tracie Harrier Other Clinician: Referring Willson Lipa: Tracie Harrier Treating Patterson Hollenbaugh/Extender: Tito Dine in Treatment: 2 Visit Information History Since Last Visit Added or deleted any medications: No Patient Arrived: Cane Any new allergies or adverse reactions: No Arrival Time: 09:28 Had a fall or experienced change in No Accompanied By: self activities of daily living that may affect Transfer Assistance: None risk of falls: Patient Has Alerts: Yes Signs or symptoms of abuse/neglect since last visito No Patient Alerts: Patient on Blood Thinner Hospitalized since last visit: No Warfarin Has Dressing in Place as Prescribed: Yes DMII Pain Present Now: No Electronic Signature(s) Signed: 01/17/2019 11:16:42 AM By: Army Melia Entered By: Army Melia on 01/17/2019 09:28:44 JACKSON-Skora, Kimbely K. ([id number]) -------------------------------------------------------------------------------- Encounter Discharge Information Details Patient Name: Kara [name], Rolando K. Date of Service: 01/17/2019 9:30 AM Medical Record Number: [id number] Patient Account Number: [id number] Date of Birth/Sex: 01/20/1951 (68 y.o. F) Treating RN: Harold Barban Primary Care Sarya Linenberger: Tracie Harrier Other Clinician: Referring Ram Haugan: Tracie Harrier Treating Jentry Warnell/Extender: Tito Dine in Treatment: 2 Encounter Discharge Information Items Post Procedure Vitals Discharge Condition: Stable Temperature (F): 97.6 Ambulatory Status: Cane Pulse (bpm): 85 Discharge Destination: Home Respiratory Rate (breaths/min): 18 Transportation:  Private Auto Blood Pressure (mmHg): 132/68 Accompanied By: self Schedule Follow-up Appointment: Yes Clinical Summary of Care: Electronic Signature(s) Signed: 01/17/2019 4:23:12 PM By: Harold Barban Entered By: Harold Barban on 01/17/2019 09:59:13 JACKSON-Wilms, Takila K. ([id number]) -------------------------------------------------------------------------------- Lower Extremity Assessment Details Patient Name: Kara [name], Vergene K. Date of Service: 01/17/2019 9:30 AM Medical Record Number: [id number] Patient Account Number: [id number] Date of Birth/Sex: [birth year] (68 y.o. F) Treating RN: Army Melia Primary Care Angele Wiemann: Tracie Harrier Other Clinician: Referring Fantasy Donald: Tracie Harrier Treating Joby Hershkowitz/Extender: Ricard Dillon Weeks in Treatment: 2 Edema Assessment Assessed: [Left: No] [Right: No] Edema: [Left: N] [Right: o] Calf Left: Right: Point of Measurement: 32 cm From Medial Instep 37 cm cm Ankle Left: Right: Point of Measurement: 9 cm From Medial Instep 18 cm cm Vascular Assessment Pulses: Dorsalis Pedis Palpable: [Left:Yes] Electronic Signature(s) Signed: 01/17/2019 11:16:42 AM By: Army Melia Entered By: Army Melia on 01/17/2019 09:37:28 Kara [name], Detta K. ([id number]) -------------------------------------------------------------------------------- Multi Wound Chart Details Patient Name: Kara [name], Marilee K. Date of Service: 01/17/2019 9:30 AM Medical Record Number: [id number] Patient Account Number: [id number] Date of Birth/Sex: 1951/02/10 (68 y.o. F) Treating RN: Harold Barban Primary Care Arnett Duddy: Tracie Harrier Other Clinician: Referring Klein Willcox: Tracie Harrier Treating Maxwel Meadowcroft/Extender: Tito Dine in Treatment: 2 Vital Signs Height(in): 63 Pulse(bpm): 85 Weight(lbs): 227 Blood Pressure(mmHg): 132/68 Body Mass Index(BMI): 40 Temperature(F): 97.6 Respiratory Rate 16 (breaths/min): Photos:  [N/A:N/A] Wound Location: Left Lower Leg - Anterior N/A N/A Wounding Event: Trauma N/A N/A Primary Etiology: Diabetic Wound/Ulcer of the N/A N/A Lower Extremity Secondary Etiology: Trauma, Other N/A N/A Comorbid History: Congestive Heart Failure, N/A N/A Deep Vein Thrombosis, Hypertension, Type II Diabetes, Osteoarthritis, Neuropathy Date Acquired: 12/21/2018 N/A N/A Weeks of Treatment: 2 N/A N/A Wound Status: Open N/A N/A Measurements L x W x D 3.6x1.5x0.5 N/A N/A (cm) Area (cm) : 4.241 N/A N/A Volume (cm) : 2.121 N/A N/A % Reduction in Area: 21.10% N/A N/A % Reduction in Volume: 43.60% N/A N/A Classification: Grade 1 N/A N/A Exudate Amount: Large N/A N/A Exudate Type: Serosanguineous N/A  N/A Exudate Color: red, brown N/A N/A Wound Margin: Flat and Intact N/A N/A Granulation Amount: Medium (34-66%) N/A N/A Granulation Quality: Pink N/A N/A Necrotic Amount: Medium (34-66%) N/A N/A Exposed Structures: Fat Layer (Subcutaneous N/A N/A Tissue) Exposed: Yes Kara [name], Monet K. ([id number]) Fascia: No Tendon: No Muscle: No Joint: No Bone: No Epithelialization: None N/A N/A Debridement: Debridement - Excisional N/A N/A Pre-procedure 09:46 N/A N/A Verification/Time Out Taken: Pain Control: Lidocaine N/A N/A Tissue Debrided: Subcutaneous N/A N/A Level: Skin/Subcutaneous Tissue N/A N/A Debridement Area (sq cm): 5.4 N/A N/A Instrument: Curette N/A N/A Bleeding: Minimum N/A N/A Hemostasis Achieved: Pressure N/A N/A Procedural Pain: 0 N/A N/A Post Procedural Pain: 0 N/A N/A Debridement Treatment Procedure was tolerated well N/A N/A Response: Post Debridement 3.6x1.5x0.9 N/A N/A Measurements L x W x D (cm) Post Debridement Volume: 3.817 N/A N/A (cm) Procedures Performed: Debridement N/A N/A Treatment Notes Electronic Signature(s) Signed: 01/17/2019 4:56:01 PM By: Linton Ham MD Previous Signature: 01/17/2019 9:40:50 AM Version By: Harold Barban Entered By:  Linton Ham on 01/17/2019 09:50:27 JACKSON-Sytsma, Briele K. ([id number]) -------------------------------------------------------------------------------- Multi-Disciplinary Care Plan Details Patient Name: Kara [name], Takira K. Date of Service: 01/17/2019 9:30 AM Medical Record Number: [id number] Patient Account Number: [id number] Date of Birth/Sex: 12/23/1950 (68 y.o. F) Treating RN: Harold Barban Primary Care Oona Trammel: Tracie Harrier Other Clinician: Referring Reve Crocket: Tracie Harrier Treating Sanaiya Welliver/Extender: Tito Dine in Treatment: 2 Active Inactive Abuse / Safety / Falls / Self Care Management Nursing Diagnoses: History of Falls Goals: Patient will not experience any injury related to falls Date Initiated: 01/03/2019 Target Resolution Date: 01/17/2019 Goal Status: Active Interventions: Assess fall risk on admission and as needed Notes: Medication Nursing Diagnoses: Knowledge deficit related to medication safety: actual or potential Goals: Patient/caregiver will demonstrate understanding of all current medications Date Initiated: 01/03/2019 Target Resolution Date: 01/03/2019 Goal Status: Active Interventions: Patient/Caregiver given reconciled medication list upon admission, changes in medications and discharge from the [place] Notes: Necrotic Tissue Nursing Diagnoses: Impaired tissue integrity related to necrotic/devitalized tissue Goals: Necrotic/devitalized tissue will be minimized in the wound bed Date Initiated: 01/03/2019 Target Resolution Date: 01/10/2019 Goal Status: Active Interventions: JACKSON-Ammar, Amandamarie K. ([id number]) Assess patient pain level pre-, during and post procedure and prior to discharge Treatment Activities: Apply topical anesthetic as ordered : 01/03/2019 Excisional debridement : 01/03/2019 Notes: Wound/Skin Impairment Nursing Diagnoses: Impaired tissue integrity Goals: Ulcer/skin breakdown will have a  volume reduction of 30% by week 4 Date Initiated: 01/03/2019 Target Resolution Date: 02/03/2019 Goal Status: Active Interventions: Assess patient/caregiver ability to obtain necessary supplies Assess ulceration(s) every visit Treatment Activities: Skin care regimen initiated : 01/03/2019 Topical wound management initiated : 01/03/2019 Notes: Electronic Signature(s) Signed: 01/17/2019 9:40:40 AM By: Harold Barban Entered By: Harold Barban on 01/17/2019 09:40:40 JACKSON-Blea, Carleah K. ([id number]) -------------------------------------------------------------------------------- Pain Assessment Details Patient Name: JACKSON-Hermans, Chrishauna K. Date of Service: 01/17/2019 9:30 AM Medical Record Number: [id number] Patient Account Number: [id number] Date of Birth/Sex: May 19, 1951 (68 y.o. F) Treating RN: Army Melia Primary Care Sandeep Radell: Tracie Harrier Other Clinician: Referring Jia Dottavio: Tracie Harrier Treating Pennye Beeghly/Extender: Ricard Dillon Weeks in Treatment: 2 Active Problems Location of Pain Severity and Description of Pain Patient Has Paino No Site Locations Pain Management and Medication Current Pain Management: Electronic Signature(s) Signed: 01/17/2019 11:16:42 AM By: Army Melia Entered By: Army Melia on 01/17/2019 [place], Zahli K. ([id number]) -------------------------------------------------------------------------------- Patient/Caregiver Education Details Patient Name: JACKSON-Kristiansen, Miracle K. Date of Service: 01/17/2019 9:30 AM Medical Record Number: [id number] Patient Account Number: [id number] Date of Birth/Gender: 07-Apr-1951 (68  y.o. F) Treating RN: Harold Barban Primary Care Physician: Tracie Harrier Other Clinician: Referring Physician: Tracie Harrier Treating Physician/Extender: Tito Dine in Treatment: 2 Education Assessment Education Provided To: Patient Education Topics Provided Wound/Skin  Impairment: Handouts: Caring for Your Ulcer Methods: Demonstration, Explain/Verbal Responses: State content correctly Electronic Signature(s) Signed: 01/17/2019 4:23:12 PM By: Harold Barban Entered By: Harold Barban on 01/17/2019 [place], Findley K. ([id number]) -------------------------------------------------------------------------------- Wound Assessment Details Patient Name: JACKSON-Macwilliams, Lundynn K. Date of Service: 01/17/2019 9:30 AM Medical Record Number: [id number] Patient Account Number: [id number] Date of Birth/Sex: 14-Jul-1950 (68 y.o. F) Treating RN: Army Melia Primary Care Fionnuala Hemmerich: Tracie Harrier Other Clinician: Referring Jannetta Massey: Tracie Harrier Treating Anson Peddie/Extender: Ricard Dillon Weeks in Treatment: 2 Wound Status Wound Number: 1 Primary Diabetic Wound/Ulcer of the Lower Extremity Etiology: Wound Location: Left Lower Leg - Anterior Secondary Trauma, Other Wounding Event: Trauma Etiology: Date Acquired: 12/21/2018 Wound Open Weeks Of Treatment: 2 Status: Clustered Wound: No Comorbid Congestive Heart Failure, Deep Vein History: Thrombosis, Hypertension, Type II Diabetes, Osteoarthritis, Neuropathy Photos Wound Measurements Length: (cm) 3.6 % Reduction i Width: (cm) 1.5 % Reduction i Depth: (cm) 0.5 Epithelializa Area: (cm) 4.241 Tunneling: Volume: (cm) 2.121 Undermining: n Area: 21.1% n Volume: 43.6% tion: None No No Wound Description Classification: Grade 1 Foul Odor Aft Wound Margin: Flat and Intact Slough/Fibrin Exudate Amount: Large Exudate Type: Serosanguineous Exudate Color: red, brown er Cleansing: No o Yes Wound Bed Granulation Amount: Medium (34-66%) Exposed Structure Granulation Quality: Pink Fascia Exposed: No Necrotic Amount: Medium (34-66%) Fat Layer (Subcutaneous Tissue) Exposed: Yes Necrotic Quality: Adherent Slough Tendon Exposed: No Muscle Exposed: No Joint Exposed: No Bone Exposed:  No JACKSON-Borba, Lakendria K. ([id number]) Treatment Notes Wound #1 (Left, Anterior Lower Leg) Notes Left: Sliver Alginate, x-sorb, 3layer unna to anchor; Right-ace wrap. Electronic Signature(s) Signed: 01/17/2019 11:16:42 AM By: Army Melia Entered By: Army Melia on 01/17/2019 09:37:02 JACKSON-Mullarkey, Liliani K. ([id number]) -------------------------------------------------------------------------------- Vitals Details Patient Name: JACKSON-Veron, Briell K. Date of Service: 01/17/2019 9:30 AM Medical Record Number: [id number] Patient Account Number: [id number] Date of Birth/Sex: Sep 01, 1950 (68 y.o. F) Treating RN: Army Melia Primary Care Tanairi Cypert: Tracie Harrier Other Clinician: Referring Kili Gracy: Tracie Harrier Treating Ayansh Feutz/Extender: Tito Dine in Treatment: 2 Vital Signs Time Taken: 09:29 Temperature (F): 97.6 Height (in): 63 Pulse (bpm): 85 Weight (lbs): 227 Respiratory Rate (breaths/min): 16 Body Mass Index (BMI): 40.2 Blood Pressure (mmHg): 132/68 Reference Range: 80 - 120 mg / dl Electronic Signature(s) Signed: 01/17/2019 11:16:42 AM By: Army Melia Entered By: Army Melia on 01/17/2019 09:29:32

## 2019-01-17 NOTE — Progress Notes (Signed)
Kara [name], Kara [name] ([id number]) Visit Report for 01/17/2019 Debridement Details Patient Name: Kara [name], [name] Date of Service: 01/17/2019 9:30 AM Medical Record Number: [id number] Patient Account Number: [id number] Date of Birth/Sex: 1950/07/08 (68 y.o. F) Treating RN: Harold Barban Primary Care Provider: Tracie Harrier Other Clinician: Referring Provider: Tracie Harrier Treating Provider/Extender: Tito Dine in Treatment: 2 Debridement Performed for Wound #1 Left,Anterior Lower Leg Assessment: Performed By: Physician Ricard Dillon, MD Debridement Type: Debridement Severity of Tissue Pre Fat layer exposed Debridement: Level of Consciousness (Pre- Awake and Alert procedure): Pre-procedure Verification/Time Yes - 09:46 Out Taken: Start Time: 09:46 Pain Control: Lidocaine Total Area Debrided (L x W): 3.6 (cm) x 1.5 (cm) = 5.4 (cm) Tissue and other material Viable, Subcutaneous debrided: Level: Skin/Subcutaneous Tissue Debridement Description: Excisional Instrument: Curette Bleeding: Minimum Hemostasis Achieved: Pressure End Time: 09:47 Procedural Pain: 0 Post Procedural Pain: 0 Response to Treatment: Procedure was tolerated well Level of Consciousness Awake and Alert (Post-procedure): Post Debridement Measurements of Total Wound Length: (cm) 3.6 Width: (cm) 1.5 Depth: (cm) 0.9 Volume: (cm) 3.817 Character of Wound/Ulcer Post Debridement: Improved Severity of Tissue Post Debridement: Fat layer exposed Post Procedure Diagnosis Same as Pre-procedure Electronic Signature(s) Signed: 01/17/2019 4:23:12 PM By: Harold Barban Signed: 01/17/2019 4:56:01 PM By: Linton Ham MD Entered By: Linton Ham on 01/17/2019 09:50:41 Kara [name], [name] ([id number]) Kara [name], [name] ([id number]) -------------------------------------------------------------------------------- HPI Details Patient Name: Kara [name], Kara  K. Date of Service: 01/17/2019 9:30 AM Medical Record Number: [id number] Patient Account Number: [id number] Date of Birth/Sex: 01-07-1951 (68 y.o. F) Treating RN: Harold Barban Primary Care Provider: Tracie Harrier Other Clinician: Referring Provider: Tracie Harrier Treating Provider/Extender: Tito Dine in Treatment: 2 History of Present Illness HPI Description: Pleasant 68 year old Caucasian female who fell down a few steps in her home 2 weeks ago and developed bruises on both legs on the shins, on the left shin area she did have an open area that scabbed, patient did ED on 8/1 at which point she had dressing applied to the left shin wound, she had a right shin hematoma that was lanced with oozing of blood after. Since that time patient has noticed that the left calf leg wound developed a black covering on top and had started to hurt, she has been dressing this daily with an antibiotic ointment and gauze wrap. 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 She had a traumatic wound on the left lower extremity. We have been using silver alginate because of drainage. She has been in compression 8/26; this was initially a traumatic wound on the left lower extremity. It has some depth superiorly appears to have come in this week. We have been using silver alginate because of drainage Electronic Signature(s) Signed: 01/17/2019 4:56:01 PM By:  Linton Ham MD Entered By: Linton Ham on 01/17/2019 09:53:24 JACKSON-Kho, Levaeh K. ([id number]) -------------------------------------------------------------------------------- Physical Exam Details Patient Name: Kara [name], [name] Date of Service: 01/17/2019 9:30  AM Medical Record Number: [id number] Patient Account Number: [id number] Date of Birth/Sex: 10-04-1950 (68 y.o. F) Treating RN: Harold Barban Primary Care Provider: Tracie Harrier Other Clinician: Referring Provider: Tracie Harrier Treating Provider/Extender: Ricard Dillon Weeks in Treatment: 2 Constitutional Sitting or standing Blood Pressure is within target range for patient.. Pulse regular and within target range for patient.Marland Kitchen Respirations regular, non-labored and within target range.. Temperature is normal and within the target range for the patient.Marland Kitchen appears in no distress. Respiratory . Cardiovascular Pedal pulses are palpable on the left. Notes Wound exam; left anterior lower leg. Once again requires debridement with a #5 curette to remove necrotic material. This is mostly in the superior part of wound that has the depth. Post debridement the wound bed looks quite stable. Hemostasis with direct pressure. Her edema is well controlled there is no evidence of surrounding infection. Electronic Signature(s) Signed: 01/17/2019 4:56:01 PM By: Linton Ham MD Entered By: Linton Ham on 01/17/2019 09:55:12 Kara [name], Kara [place] K. ([id number]) -------------------------------------------------------------------------------- Physician Orders Details Patient Name: Kara [name], [name] Date of Service: 01/17/2019 9:30 AM Medical Record Number: [id number] Patient Account Number: [id number] Date of Birth/Sex: [birth year] (68 y.o. F) Treating RN: Harold Barban Primary Care Provider: Tracie Harrier Other Clinician: Referring Provider: Tracie Harrier Treating Provider/Extender: Tito Dine in Treatment: 2 Verbal / Phone Orders: No Diagnosis Coding Wound Cleansing Wound #1 Left,Anterior Lower Leg o May shower with protection. Anesthetic (add to Medication List) Wound #1 Left,Anterior Lower Leg o Topical Lidocaine 4% cream applied to wound bed prior to debridement (In Clinic Only). o Benzocaine Topical Anesthetic Spray applied to wound bed prior to debridement (In Clinic Only). Primary Wound Dressing Wound #1 Left,Anterior Lower Leg o Silver Alginate Secondary Dressing Wound #1 Left,Anterior Lower Leg o Other - Xtrasorb Dressing Change Frequency Wound #1 Left,Anterior Lower Leg o Change dressing every week Follow-up Appointments Wound #1 Left,Anterior Lower Leg o Return Appointment in 1 week. o Nurse Visit as needed Edema Control Wound #1 Left,Anterior Lower Leg o 3 Layer Compression System - Left Lower Extremity o Elevate legs to the level of the heart and pump ankles as often as possible o Other: - Ace Wrap right Electronic Signature(s) Signed: 01/17/2019 4:23:12 PM By: Harold Barban Signed: 01/17/2019 4:56:01 PM By: Linton Ham MD Entered By: Harold Barban on 01/17/2019 09:49:41 JACKSON-Switala, Milissa K. ([id number]) -------------------------------------------------------------------------------- Problem List Details Patient Name: Kara [name], [name] Date of Service: 01/17/2019 9:30 AM Medical Record Number: [id number] Patient Account Number: [id number] Date of Birth/Sex: Dec 27, 1950 (68 y.o. F) Treating RN: Harold Barban Primary Care Provider: Tracie Harrier Other Clinician: Referring Provider: Tracie Harrier Treating Provider/Extender: Tito Dine in Treatment: 2 Active Problems ICD-10 Evaluated Encounter Code Description Active Date Today Diagnosis L97.222 Non-pressure chronic ulcer of left calf with fat layer exposed 01/03/2019 No Yes E11.9 Type 2 diabetes mellitus without  complications 0/24/0973 No Yes Inactive Problems Resolved Problems Electronic Signature(s) Signed: 01/17/2019 4:56:01 PM By: Linton Ham MD Entered By: Linton Ham on 01/17/2019 09:50:09 Kara [name], [name] ([id number]) -------------------------------------------------------------------------------- Progress Note Details Patient Name: JACKSON-Sturdevant, Chanele K. Date of Service: 01/17/2019 9:30 AM Medical Record Number: [id number] Patient Account Number: [id number] Date of Birth/Sex: [birth year] (68 y.o. F) Treating RN: Harold Barban Primary Care Provider: Tracie Harrier Other Clinician: Referring Provider: Tracie Harrier Treating Provider/Extender: Tito Dine in Treatment: 2 Subjective History of Present Illness (HPI) Pleasant 68 year old Caucasian female who fell down a few steps in her home 2 weeks ago and developed bruises on both legs on  the shins, on the left shin area she did have an open area that scabbed, patient did ED on 8/1 at which point she had dressing applied to the left shin wound, she had a right shin hematoma that was lanced with oozing of blood after. Since that time patient has noticed that the left calf leg wound developed a black covering on top and had started to hurt, she has been dressing this daily with an antibiotic ointment and gauze wrap. 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 She had a  traumatic wound on the left lower extremity. We have been using silver alginate because of drainage. She has been in compression 8/26; this was initially a traumatic wound on the left lower extremity. It has some depth superiorly appears to have come in this week. We have been using silver alginate because of drainage Objective Constitutional Sitting or standing Blood Pressure is within target range for patient.. Pulse regular and within target range for patient.Marland Kitchen Respirations regular, non-labored and within target range.. Temperature is normal and within the target range for the patient.Marland Kitchen appears in no distress. Vitals Time Taken: 9:29 AM, Height: 63 in, Weight: 227 lbs, BMI: 40.2, Temperature: 97.6 F, Pulse: 85 bpm, Respiratory Rate: 16 breaths/min, Blood Pressure: 132/68 mmHg. Cardiovascular Pedal pulses are palpable on the left. General Notes: Wound exam; left anterior lower leg. Once again requires debridement with a #5 curette to remove necrotic material. This is mostly in the superior part of wound that has the depth. Post debridement the wound bed looks quite stable. Hemostasis with direct pressure. Her edema is well controlled there is no evidence of surrounding infection. Integumentary (Hair, Skin) Wound #1 status is Open. Original cause of wound was Trauma. The wound is located on the Left,Anterior Lower Leg. The JACKSON-Roddey, Lydie K. ([id number]) wound measures 3.6cm length x 1.5cm width x 0.5cm depth; 4.241cm^2 area and 2.121cm^3 volume. There is Fat Layer (Subcutaneous Tissue) Exposed exposed. There is no tunneling or undermining noted. There is a large amount of serosanguineous drainage noted. The wound margin is flat and intact. There is medium (34-66%) pink granulation within the wound bed. There is a medium (34-66%) amount of necrotic tissue within the wound bed including Adherent Slough. Assessment Active Problems ICD-10 Non-pressure chronic ulcer of left calf with  fat layer exposed Type 2 diabetes mellitus without complications Procedures Wound #1 Pre-procedure diagnosis of Wound #1 is a Diabetic Wound/Ulcer of the Lower Extremity located on the Left,Anterior Lower Leg .Severity of Tissue Pre Debridement is: Fat layer exposed. There was a Excisional Skin/Subcutaneous Tissue Debridement with a total area of 5.4 sq cm performed by Ricard Dillon, MD. With the following instrument(s): Curette to remove Viable tissue/material. Material removed includes Subcutaneous Tissue after achieving pain control using Lidocaine. No specimens were taken. A time out was conducted at 09:46, prior to the start of the procedure. A Minimum amount of bleeding was controlled with  Pressure. The procedure was tolerated well with a pain level of 0 throughout and a pain level of 0 following the procedure. Post Debridement Measurements: 3.6cm length x 1.5cm width x 0.9cm depth; 3.817cm^3 volume. Character of Wound/Ulcer Post Debridement is improved. Severity of Tissue Post Debridement is: Fat layer exposed. Post procedure Diagnosis Wound #1: Same as Pre-Procedure Plan Wound Cleansing: Wound #1 Left,Anterior Lower Leg: May shower with protection. Anesthetic (add to Medication List): Wound #1 Left,Anterior Lower Leg: Topical Lidocaine 4% cream applied to wound bed prior to debridement (In Clinic Only). Benzocaine Topical Anesthetic Spray applied to wound bed prior to debridement (In Clinic Only). Primary Wound Dressing: Wound #1 Left,Anterior Lower Leg: Silver Alginate Secondary Dressing: Wound #1 Left,Anterior Lower Leg: Other - Xtrasorb Dressing Change Frequency: Wound #1 Left,Anterior Lower Leg: Change dressing every week JACKSON-Shakespeare, Simmie K. ([id number]) Follow-up Appointments: Wound #1 Left,Anterior Lower Leg: Return Appointment in 1 week. Nurse Visit as needed Edema Control: Wound #1 Left,Anterior Lower Leg: 3 Layer Compression System - Left Lower  Extremity Elevate legs to the level of the heart and pump ankles as often as possible Other: - Ace Wrap right 1. I am continuing with silver alginate with extra sorb 2. It appears like the wound is contracting and filling in therefore I did not feel pressured to change the dressing today. 3. Still need to be vigilant about the amount of necrotic material on the wound surface and the drainage Electronic Signature(s) Signed: 01/17/2019 4:56:01 PM By: Linton Ham MD Entered By: Linton Ham on 01/17/2019 09:56:09 JACKSON-Helf, Valeda K. ([id number]) -------------------------------------------------------------------------------- SuperBill Details Patient Name: JACKSON-Queener, Aleida K. Date of Service: 01/17/2019 Medical Record Number: [id number] Patient Account Number: [id number] Date of Birth/Sex: 09/26/1950 (68 y.o. F) Treating RN: Harold Barban Primary Care Provider: Tracie Harrier Other Clinician: Referring Provider: Tracie Harrier Treating Provider/Extender: Tito Dine in Treatment: 2 Diagnosis Coding ICD-10 Codes Code Description [phone] Non-pressure chronic ulcer of left calf with fat layer exposed E11.9 Type 2 diabetes mellitus without complications Facility Procedures CPT4 Code: [id number] Description: 97673 - DEB SUBQ TISSUE 20 SQ CM/< ICD-10 Diagnosis Description L97.222 Non-pressure chronic ulcer of left calf with fat layer expo Modifier: sed Quantity: 1 Physician Procedures CPT4 Code: [id number] Description: 24097 - WC PHYS SUBQ TISS 20 SQ CM ICD-10 Diagnosis Description L97.222 Non-pressure chronic ulcer of left calf with fat layer expo Modifier: sed Quantity: 1 Electronic Signature(s) Signed: 01/17/2019 4:56:01 PM By: Linton Ham MD Entered By: Linton Ham on 01/17/2019 09:56:29

## 2019-01-19 ENCOUNTER — Other Ambulatory Visit: Payer: PPO

## 2019-01-24 ENCOUNTER — Encounter: Payer: PPO | Attending: Internal Medicine | Admitting: Internal Medicine

## 2019-01-24 ENCOUNTER — Other Ambulatory Visit: Payer: Self-pay

## 2019-01-24 DIAGNOSIS — Z885 Allergy status to narcotic agent status: Secondary | ICD-10-CM | POA: Insufficient documentation

## 2019-01-24 DIAGNOSIS — Z881 Allergy status to other antibiotic agents status: Secondary | ICD-10-CM | POA: Diagnosis not present

## 2019-01-24 DIAGNOSIS — S8011XA Contusion of right lower leg, initial encounter: Secondary | ICD-10-CM | POA: Diagnosis not present

## 2019-01-24 DIAGNOSIS — L97812 Non-pressure chronic ulcer of other part of right lower leg with fat layer exposed: Secondary | ICD-10-CM | POA: Insufficient documentation

## 2019-01-24 DIAGNOSIS — L97822 Non-pressure chronic ulcer of other part of left lower leg with fat layer exposed: Secondary | ICD-10-CM | POA: Diagnosis not present

## 2019-01-24 DIAGNOSIS — M1990 Unspecified osteoarthritis, unspecified site: Secondary | ICD-10-CM | POA: Insufficient documentation

## 2019-01-24 DIAGNOSIS — E1142 Type 2 diabetes mellitus with diabetic polyneuropathy: Secondary | ICD-10-CM | POA: Diagnosis not present

## 2019-01-24 DIAGNOSIS — I509 Heart failure, unspecified: Secondary | ICD-10-CM | POA: Insufficient documentation

## 2019-01-24 DIAGNOSIS — I110 Hypertensive heart disease with heart failure: Secondary | ICD-10-CM | POA: Insufficient documentation

## 2019-01-24 DIAGNOSIS — W109XXA Fall (on) (from) unspecified stairs and steps, initial encounter: Secondary | ICD-10-CM | POA: Diagnosis not present

## 2019-01-24 DIAGNOSIS — Z85038 Personal history of other malignant neoplasm of large intestine: Secondary | ICD-10-CM | POA: Diagnosis not present

## 2019-01-24 DIAGNOSIS — L97222 Non-pressure chronic ulcer of left calf with fat layer exposed: Secondary | ICD-10-CM | POA: Diagnosis not present

## 2019-01-24 DIAGNOSIS — Z86718 Personal history of other venous thrombosis and embolism: Secondary | ICD-10-CM | POA: Diagnosis not present

## 2019-01-24 DIAGNOSIS — Z7901 Long term (current) use of anticoagulants: Secondary | ICD-10-CM | POA: Diagnosis not present

## 2019-01-24 DIAGNOSIS — E11622 Type 2 diabetes mellitus with other skin ulcer: Secondary | ICD-10-CM | POA: Insufficient documentation

## 2019-01-24 DIAGNOSIS — Z86711 Personal history of pulmonary embolism: Secondary | ICD-10-CM | POA: Insufficient documentation

## 2019-01-26 NOTE — Progress Notes (Signed)
Kara [name], Kara [name] ([id number]) Visit Report for 01/24/2019 Debridement Details Patient Name: [name], [name] Date of Service: 01/24/2019 8:15 AM Medical Record Number: [id number] Patient Account Number: [id number] Date of Birth/Sex: 1951-03-21 (68 y.o. F) Treating RN: Kara [name] Primary Care Provider: Tracie [name] Other Clinician: Referring Provider: Tracie [name] Treating Provider/Extender: Kara [name] in Treatment: 3 Debridement Performed for Wound #1 Left,Anterior Lower Leg Assessment: Performed By: Physician Kara Dillon, MD Debridement Type: Debridement Severity of Tissue Pre Fat layer exposed Debridement: Level of Consciousness (Pre- Awake and Alert procedure): Pre-procedure Verification/Time Yes - 08:56 Out Taken: Start Time: 08:56 Pain Control: Lidocaine Total Area Debrided (L x W): 3.4 (cm) x 1.3 (cm) = 4.42 (cm) Tissue and other material Viable, Non-Viable, Slough, Subcutaneous, Slough debrided: Level: Skin/Subcutaneous Tissue Debridement Description: Excisional Instrument: Curette Bleeding: Moderate Hemostasis Achieved: Pressure End Time: 09:00 Response to Treatment: Procedure was tolerated well Level of Consciousness Awake and Alert (Post-procedure): Post Debridement Measurements of Total Wound Length: (cm) 3.4 Width: (cm) 1.3 Depth: (cm) 0.3 Volume: (cm) 1.041 Character of Wound/Ulcer Post Debridement: Stable Severity of Tissue Post Debridement: Fat layer exposed Post Procedure Diagnosis Same as Pre-procedure Electronic Signature(s) Signed: 01/24/2019 5:28:46 PM By: Linton Ham MD Signed: 01/26/2019 10:04:45 AM By: Gretta Cool, BSN, RN, CWS, Kim RN, BSN Entered By: Linton Ham on 01/24/2019 09:10:05 [name], [name] ([id number]) -------------------------------------------------------------------------------- HPI Details Patient Name: [name], [name] Date of Service: 01/24/2019 8:15 AM Medical  Record Number: [id number] Patient Account Number: [id number] Date of Birth/Sex: Nov 09, 1950 (68 y.o. F) Treating RN: Kara [name] Primary Care Provider: Tracie [name] Other Clinician: Referring Provider: Tracie [name] Treating Provider/Extender: Kara [name] in Treatment: 3 History of Present Illness HPI Description: Pleasant 68 year old Caucasian female who fell down a few steps in her home 2 weeks ago and developed bruises on both legs on the shins, on the left shin area she did have an open area that scabbed, patient did ED on 8/1 at which point she had dressing applied to the left shin wound, she had a right shin hematoma that was lanced with oozing of blood after. Since that time patient has noticed that the left calf leg wound developed a black covering on top and had started to hurt, she has been dressing this daily with an antibiotic ointment and gauze wrap. 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 She had a traumatic wound on the left lower extremity. We have been using silver alginate because of drainage. She has been in compression 8/26; this was initially a traumatic wound on the left lower extremity. It has some depth superiorly appears to have come in this week. We have been using silver alginate because of drainage 01/24/19 once again the wrap seems to have come down and she took it off and put Vaseline on the wound on Sunday. She  did not call the clinic. About 75% of this is fully granulated 25% is  not and that is superiorly. We have been using silver alginate but all changed to silver collagen today. Electronic Signature(s) Signed: 01/26/2019 9:59:18 AM By: Gretta Cool, BSN, RN, CWS, Kim RN, BSN Signed: 01/26/2019 4:25:44 PM By: Linton Ham MD Previous Signature: 01/24/2019 5:28:46 PM Version By: Linton Ham MD Entered By: Gretta Cool, BSN, RN, CWS, Kim on 01/26/2019 09:59:18 [name], [name] ([id number]) -------------------------------------------------------------------------------- Physical Exam Details Patient Name: [name], [name] Date of Service: 01/24/2019 8:15 AM Medical Record Number: [id number] Patient Account Number: [id number] Date of Birth/Sex: 04/29/1951 (68 y.o. F) Treating RN: Kara [name] Primary Care Provider: Tracie [name] Other Clinician: Referring Provider: Tracie [name] Treating Provider/Extender: Kara [name] in Treatment: 3 Constitutional Patient is hypertensive.. Pulse regular and within target range for patient.Marland Kitchen Respirations regular, non-labored and within target range.. Temperature is normal and within the target range for the patient.Marland Kitchen appears in no distress. Notes Wound exam; left anterior lower leg. Debridement of the top 25% of the wound to remove surface slough from the deeper area. Hemostasis with direct pressure. There is no evidence of infection. Her edema control looks quite good. Electronic Signature(s) Signed: 01/24/2019 5:28:46 PM By: Linton Ham MD Entered By: Linton Ham on 01/24/2019 09:11:45 [place], [place] K. ([id number]) -------------------------------------------------------------------------------- Physician Orders Details Patient Name: [name], [name] Date of Service: 01/24/2019 8:15 AM Medical Record Number: [id number] Patient Account Number: [id number] Date of Birth/Sex: 07/20/1950 (68 y.o. F) Treating RN: Kara [name] Primary Care Provider: Tracie [name] Other Clinician: Referring Provider: Tracie [name] Treating Provider/Extender: Kara [name] in Treatment: 3 Verbal / Phone Orders: No Diagnosis Coding Wound Cleansing Wound #1 Left,Anterior Lower Leg o May shower with protection. Anesthetic (add to Medication List) Wound #1 Left,Anterior Lower Leg o Topical Lidocaine 4% cream applied to wound bed prior to debridement (In Clinic Only). Primary Wound Dressing Wound #1 Left,Anterior Lower Leg o Silver Collagen Secondary Dressing Wound #1 Left,Anterior Lower Leg o Other - Xtrasorb Dressing Change Frequency Wound #1 Left,Anterior Lower Leg o Change dressing every week Follow-up Appointments Wound #1 Left,Anterior Lower Leg o Return Appointment in 1 week. o Nurse Visit as needed Edema Control Wound #1 Left,Anterior Lower Leg o 3 Layer Compression System - Left Lower Extremity - unna to anchor o Elevate legs to the level of the heart and pump ankles as often as possible Electronic Signature(s) Signed: 01/24/2019 5:28:46 PM By: Linton Ham MD Signed: 01/26/2019 10:04:45 AM By: Gretta Cool, BSN, RN, CWS, Kim RN, BSN Entered By: Gretta Cool, BSN, RN, CWS, Kim on 01/24/2019 08:57:51 [place], Akiyah K. ([id number]) -------------------------------------------------------------------------------- Problem List Details Patient Name: [name], [name] Date of Service: 01/24/2019 8:15 AM Medical Record Number: [id number] Patient Account Number: [id number] Date of Birth/Sex: [birth year] (68 y.o. F) Treating RN: Kara [name] Primary Care Provider: Tracie [name] Other Clinician: Referring Provider: Tracie [name] Treating Provider/Extender: Kara [name] in Treatment: 3 Active Problems ICD-10 Evaluated Encounter Code Description Active Date Today Diagnosis L97.222 Non-pressure chronic ulcer of left calf with fat layer exposed 01/03/2019 No  Yes E11.9 Type 2 diabetes mellitus without complications 9/93/7169 No Yes Inactive Problems Resolved Problems Electronic Signature(s) Signed: 01/24/2019 5:28:46 PM By: Linton Ham MD Entered By: Linton Ham on 01/24/2019 09:09:46 JACKSON-Baar, Haily K. ([id number]) -------------------------------------------------------------------------------- Progress Note Details Patient Name: [name], [name] Date of Service: 01/24/2019 8:15 AM Medical Record Number: [id number] Patient Account Number: [id number] Date of Birth/Sex: 03-27-1951 (68 y.o. F) Treating RN: Kara [name] Primary Care Provider: Tracie [name] Other Clinician: Referring Provider: Tracie [name] Treating Provider/Extender: Linton Ham  G Weeks in Treatment: 3 Subjective History of Present Illness (HPI) Pleasant 68 year old Caucasian female who fell down a few steps in her home 2 weeks ago and developed bruises on both legs on the shins, on the left shin area she did have an open area that scabbed, patient did ED on 8/1 at which point she had dressing applied to the left shin wound, she had a right shin hematoma that was lanced with oozing of blood after. Since that time patient has noticed that the left calf leg wound developed a black covering on top and had started to hurt, she has been dressing this daily with an antibiotic ointment and gauze wrap. 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 She had a traumatic wound on the left lower extremity. We have been using silver alginate because of drainage. She has been in compression 8/26; this was initially a traumatic wound on the left lower extremity. It has some depth superiorly appears to have come in this week. We have been using silver alginate because of drainage 01/24/19 once again the wrap seems to have come down and she took it off and put Vaseline on the wound on Sunday. She did not call the clinic. About 75% of this is fully granulated 25% is not and that is superiorly. We have been using silver alginate but all changed to silver collagen today. Objective Constitutional Patient is hypertensive.. Pulse regular and within target range for patient.Marland Kitchen Respirations regular, non-labored and within target range.. Temperature is normal and within the target range for the patient.Marland Kitchen appears in no distress. Vitals Time Taken: 8:20 AM, Height: 63 in, Weight: 227 lbs, BMI: 40.2, Temperature: 97.9 F, Pulse: 79 bpm, Respiratory Rate: 16 breaths/min, Blood Pressure: 146/67 mmHg. General Notes: Wound exam; left anterior lower leg. Debridement of the top 25% of the wound to remove surface slough from the deeper area. Hemostasis with direct pressure. There is no evidence of infection. Her edema control looks quite good. Integumentary (Hair, Skin) Wound #1 status is Open. Original cause of wound was Trauma. The wound is located on the Left,Anterior Lower Leg. The wound measures 3.4cm length x 1.3cm width x 0.3cm depth; 3.471cm^2 area and 1.041cm^3 volume. There is Fat Layer (Subcutaneous Tissue) Exposed exposed. There is no tunneling or undermining noted. There is a large amount of [name], [name] ([id number]) serosanguineous drainage noted. The wound margin is flat and intact. There is large (67-100%) pink granulation within the wound bed. There is a small (1-33%) amount of necrotic tissue within the wound bed  including Adherent Slough. Assessment Active Problems ICD-10 Non-pressure chronic ulcer of left calf with fat layer exposed Type 2 diabetes mellitus without complications Procedures Wound #1 Pre-procedure diagnosis of Wound #1 is a Diabetic Wound/Ulcer of the Lower Extremity located on the Left,Anterior Lower Leg .Severity of Tissue Pre Debridement is: Fat layer exposed. There was a Excisional Skin/Subcutaneous Tissue Debridement with a total  area of 4.42 sq cm performed by Kara Dillon, MD. With the following instrument(s): Curette to remove Viable and Non-Viable tissue/material. Material removed includes Subcutaneous Tissue and Slough and after achieving pain control using Lidocaine. No specimens were taken. A time out was conducted at 08:56, prior to the start of the procedure. A Moderate amount of bleeding was controlled with Pressure. The procedure was tolerated well. Post Debridement Measurements: 3.4cm length x 1.3cm width x 0.3cm depth; 1.041cm^3 volume. Character of Wound/Ulcer Post Debridement is stable. Severity of Tissue Post Debridement is: Fat layer exposed. Post procedure Diagnosis Wound #1: Same as Pre-Procedure Plan Wound Cleansing: Wound #1 Left,Anterior Lower Leg: May shower with protection. Anesthetic (add to Medication List): Wound #1 Left,Anterior Lower Leg: Topical Lidocaine 4% cream applied to wound bed prior to debridement (In Clinic Only). Primary Wound Dressing: Wound #1 Left,Anterior Lower Leg: Silver Collagen Secondary Dressing: Wound #1 Left,Anterior Lower Leg: Other - Xtrasorb Dressing Change Frequency: Wound #1 Left,Anterior Lower Leg: Change dressing every week Follow-up Appointments: Wound #1 Left,Anterior Lower Leg: Return Appointment in 1 week. Nurse Visit as needed [name], Kara Darrien K. ([id number]) Edema Control: Wound #1 Left,Anterior Lower Leg: 3 Layer Compression System - Left Lower Extremity - unna to anchor Elevate legs to  the level of the heart and pump ankles as often as possible 1. Silver collagen 2. Extra sorb 3. 3 layer compression with wound at the top to anchor the compression 4. If this comes off again we have asked her to call us and we will do a nurse visit to replace it Electronic Signature(s) Signed: 01/26/2019 9:59:33 AM By: Gretta Cool, BSN, RN, CWS, Kim RN, BSN Signed: 01/26/2019 4:25:44 PM By: Linton Ham MD Previous Signature: 01/24/2019 5:28:46 PM Version By: Linton Ham MD Entered By: Gretta Cool, BSN, RN, CWS, Kim on 01/26/2019 09:59:33 JACKSON-Proano, Zoila K. ([id number]) -------------------------------------------------------------------------------- SuperBill Details Patient Name: JACKSON-Brathwaite, Janit K. Date of Service: 01/24/2019 Medical Record Number: [id number] Patient Account Number: [id number] Date of Birth/Sex: Apr 19, 1951 (68 y.o. F) Treating RN: Kara [name] Primary Care Provider: Tracie [name] Other Clinician: Referring Provider: Tracie [name] Treating Provider/Extender: Kara [name] in Treatment: 3 Diagnosis Coding ICD-10 Codes Code Description [id number] Non-pressure chronic ulcer of left calf with fat layer exposed E11.9 Type 2 diabetes mellitus without complications Facility Procedures CPT4 Code: [id number] Description: 94709 - DEB SUBQ TISSUE 20 SQ CM/< ICD-10 Diagnosis Description L97.222 Non-pressure chronic ulcer of left calf with fat layer expo Modifier: sed Quantity: 1 Physician Procedures CPT4 Code: [id number] Description: 94765 - WC PHYS SUBQ TISS 20 SQ CM ICD-10 Diagnosis Description L97.222 Non-pressure chronic ulcer of left calf with fat layer expo Modifier: sed Quantity: 1 Electronic Signature(s) Signed: 01/24/2019 5:28:46 PM By: Linton Ham MD Entered By: Linton Ham on 01/24/2019 09:12:59

## 2019-01-26 NOTE — Progress Notes (Signed)
SHARAH, FINNELL ([id number]) Visit Report for 01/24/2019 Arrival Information Details Patient Name: Kara [name], [name] Date of Service: 01/24/2019 8:15 AM Medical Record Number: [id number] Patient Account Number: [id number] Date of Birth/Sex: 22-May-1951 (68 y.o. F) Treating RN: Montey Hora Primary Care Constantin Hillery: Tracie Harrier Other Clinician: Referring Akiah Bauch: Tracie Harrier Treating Novelle Addair/Extender: Tito Dine in Treatment: 3 Visit Information History Since Last Visit Added or deleted any medications: No Patient Arrived: Cane Any new allergies or adverse reactions: No Arrival Time: 08:19 Had a fall or experienced change in No Accompanied By: self activities of daily living that may affect Transfer Assistance: None risk of falls: Patient Identification Verified: Yes Signs or symptoms of abuse/neglect since last visito No Secondary Verification Process Yes Hospitalized since last visit: No Completed: Implantable device outside of the clinic excluding No Patient Has Alerts: Yes cellular tissue based products placed in the center Patient Alerts: Patient on Blood since last visit: Thinner Has Dressing in Place as Prescribed: Yes Warfarin Has Compression in Place as Prescribed: Yes DMII Pain Present Now: No Electronic Signature(s) Signed: 01/24/2019 4:15:15 PM By: Montey Hora Entered By: Montey Hora on 01/24/2019 08:19:59 [place], [name] ([id number]) -------------------------------------------------------------------------------- Encounter Discharge Information Details Patient Name: Kara [name], [name] Date of Service: 01/24/2019 8:15 AM Medical Record Number: [id number] Patient Account Number: [id number] Date of Birth/Sex: [birth year] (68 y.o. F) Treating RN: Cornell Barman Primary Care Luberta Grabinski: Tracie Harrier Other Clinician: Referring Caylin Nass: Tracie Harrier Treating Kara [name]/Extender: Tito Dine in  Treatment: 3 Encounter Discharge Information Items Post Procedure Vitals Discharge Condition: Stable Temperature (F): 97.9 Ambulatory Status: Ambulatory Pulse (bpm): 79 Discharge Destination: Home Respiratory Rate (breaths/min): 16 Transportation: Private Auto Blood Pressure (mmHg): 146/67 Accompanied By: self Schedule Follow-up Appointment: Yes Clinical Summary of Care: Electronic Signature(s) Signed: 01/26/2019 10:04:45 AM By: Gretta Cool, BSN, RN, CWS, Kim RN, BSN Entered By: Gretta Cool, BSN, RN, CWS, Kim on 01/24/2019 08:59:34 [place], [name] ([id number]) -------------------------------------------------------------------------------- Lower Extremity Assessment Details Patient Name: Kara [name], [name] Date of Service: 01/24/2019 8:15 AM Medical Record Number: [id number] Patient Account Number: [id number] Date of Birth/Sex: [birth year] (68 y.o. F) Treating RN: Montey Hora Primary Care Katrena Stehlin: Tracie Harrier Other Clinician: Referring Britiney Blahnik: Tracie Harrier Treating Esli Clements/Extender: Tito Dine in Treatment: 3 Edema Assessment Assessed: [Left: No] [Right: No] Edema: [Left: N] [Right: o] Calf Left: Right: Point of Measurement: 32 cm From Medial Instep 39.5 cm cm Ankle Left: Right: Point of Measurement: 9 cm From Medial Instep 19 cm cm Vascular Assessment Pulses: Dorsalis Pedis Palpable: [Left:Yes] Electronic Signature(s) Signed: 01/24/2019 4:15:15 PM By: Montey Hora Entered By: Montey Hora on 01/24/2019 08:26:03 JACKSON-[place], [name] ([id number]) -------------------------------------------------------------------------------- Multi Wound Chart Details Patient Name: Kara [name], [name] Date of Service: 01/24/2019 8:15 AM Medical Record Number: [id number] Patient Account Number: [id number] Date of Birth/Sex: 11/30/1950 (68 y.o. F) Treating RN: Cornell Barman Primary Care Mlissa Tamayo: Tracie Harrier Other Clinician: Referring Chynah Orihuela:  Tracie Harrier Treating Sierrah Luevano/Extender: Tito Dine in Treatment: 3 Vital Signs Height(in): 63 Pulse(bpm): 20 Weight(lbs): 227 Blood Pressure(mmHg): 146/67 Body Mass Index(BMI): 40 Temperature(F): 97.9 Respiratory Rate 16 (breaths/min): Photos: [N/A:N/A] Wound Location: Left Lower Leg - Anterior N/A N/A Wounding Event: Trauma N/A N/A Primary Etiology: Diabetic Wound/Ulcer of the N/A N/A Lower Extremity Secondary Etiology: Trauma, Other N/A N/A Comorbid History: Congestive Heart Failure, N/A N/A Deep Vein Thrombosis, Hypertension, Type II Diabetes, Osteoarthritis, Neuropathy Date Acquired: 12/21/2018 N/A N/A Weeks of Treatment: 3 N/A N/A Wound Status: Open N/A N/A Measurements L x W x D 3.4x1.3x0.3  N/A N/A (cm) Area (cm) : 3.471 N/A N/A Volume (cm) : 1.041 N/A N/A % Reduction in Area: 35.40% N/A N/A % Reduction in Volume: 72.30% N/A N/A Classification: Grade 1 N/A N/A Exudate Amount: Large N/A N/A Exudate Type: Serosanguineous N/A N/A Exudate Color: red, brown N/A N/A Wound Margin: Flat and Intact N/A N/A Granulation Amount: Large (67-100%) N/A N/A Granulation Quality: Pink N/A N/A Necrotic Amount: Small (1-33%) N/A N/A Exposed Structures: Fat Layer (Subcutaneous N/A N/A Tissue) Exposed: Yes JACKSON-[place], [name] ([id number]) Fascia: No Tendon: No Muscle: No Joint: No Bone: No Epithelialization: None N/A N/A Debridement: Debridement - Excisional N/A N/A Pre-procedure 08:56 N/A N/A Verification/Time Out Taken: Pain Control: Lidocaine N/A N/A Tissue Debrided: Subcutaneous, Slough N/A N/A Level: Skin/Subcutaneous Tissue N/A N/A Debridement Area (sq cm): 4.42 N/A N/A Instrument: Curette N/A N/A Bleeding: Moderate N/A N/A Hemostasis Achieved: Pressure N/A N/A Debridement Treatment Procedure was tolerated well N/A N/A Response: Post Debridement 3.4x1.3x0.3 N/A N/A Measurements L x W x D (cm) Post Debridement Volume: 1.041 N/A  N/A (cm) Procedures Performed: Debridement N/A N/A Treatment Notes Wound #1 (Left, Anterior Lower Leg) Notes Left: Sliver [place], x-sorb, 3layer unna to anchor Electronic Signature(s) Signed: 01/24/2019 5:28:46 PM By: Linton Ham MD Entered By: Linton Ham on 01/24/2019 09:09:55 JACKSON-[place], [name] ([id number]) -------------------------------------------------------------------------------- [place] Details Patient Name: Kara [name], [name] Date of Service: 01/24/2019 8:15 AM Medical Record Number: [id number] Patient Account Number: [id number] Date of Birth/Sex: [birth year] (68 y.o. F) Treating RN: Cornell Barman Primary Care Ayven Glasco: Tracie Harrier Other Clinician: Referring Estiben Mizuno: Tracie Harrier Treating Deborrah Mabin/Extender: Tito Dine in Treatment: 3 Active Inactive Abuse / Safety / Falls / Self Care Management Nursing Diagnoses: History of Falls Goals: Patient will not experience any injury related to falls Date Initiated: 01/03/2019 Target Resolution Date: 01/17/2019 Goal Status: Active Interventions: Assess fall risk on admission and as needed Notes: Medication Nursing Diagnoses: Knowledge deficit related to medication safety: actual or potential Goals: Patient/caregiver will demonstrate understanding of all current medications Date Initiated: 01/03/2019 Target Resolution Date: 01/03/2019 Goal Status: Active Interventions: Patient/Caregiver given reconciled medication list upon admission, changes in medications and discharge from the [facility] Notes: Necrotic Tissue Nursing Diagnoses: Impaired tissue integrity related to necrotic/devitalized tissue Goals: Necrotic/devitalized tissue will be minimized in the wound bed Date Initiated: 01/03/2019 Target Resolution Date: 01/10/2019 Goal Status: Active Interventions: JACKSON-Seelye, Devon K. ([id number]) Assess patient pain level pre-, during and post procedure  and prior to discharge Treatment Activities: Apply topical anesthetic as ordered : 01/03/2019 Excisional debridement : 01/03/2019 Notes: Wound/Skin Impairment Nursing Diagnoses: Impaired tissue integrity Goals: Ulcer/skin breakdown will have a volume reduction of 30% by week 4 Date Initiated: 01/03/2019 Target Resolution Date: 02/03/2019 Goal Status: Active Interventions: Assess patient/caregiver ability to obtain necessary supplies Assess ulceration(s) every visit Treatment Activities: Skin care regimen initiated : 01/03/2019 Topical wound management initiated : 01/03/2019 Notes: Electronic Signature(s) Signed: 01/26/2019 10:04:45 AM By: Gretta Cool, BSN, RN, CWS, Kim RN, BSN Entered By: Gretta Cool, BSN, RN, CWS, Kim on 01/24/2019 08:55:21 Kara [name], [name] ([id number]) -------------------------------------------------------------------------------- Pain Assessment Details Patient Name: Kara [name], [name] Date of Service: 01/24/2019 8:15 AM Medical Record Number: [id number] Patient Account Number: [id number] Date of Birth/Sex: 08-16-1950 (68 y.o. F) Treating RN: Montey Hora Primary Care Akili Cuda: Tracie Harrier Other Clinician: Referring Eivin Mascio: Tracie Harrier Treating Kiyomi Pallo/Extender: Tito Dine in Treatment: 3 Active Problems Location of Pain Severity and Description of Pain Patient Has Paino No Site Locations Pain Management and Medication Current Pain Management: Electronic  Signature(s) Signed: 01/24/2019 4:15:15 PM By: Montey Hora Entered By: Montey Hora on 01/24/2019 08:20:06 Kara [name], Kara [name] ([id number]) -------------------------------------------------------------------------------- Patient/Caregiver Education Details Patient Name: Kara [name], [name] Date of Service: 01/24/2019 8:15 AM Medical Record Number: [id number] Patient Account Number: [id number] Date of Birth/Gender: 1951-05-19 (68 y.o. F) Treating RN: Cornell Barman Primary Care Physician: Tracie Harrier Other Clinician: Referring Physician: Tracie Harrier Treating Physician/Extender: Tito Dine in Treatment: 3 Education Assessment Education Provided To: Patient Education Topics Provided Wound Debridement: Handouts: Wound Debridement Methods: Demonstration, Explain/Verbal Responses: State content correctly Wound/Skin Impairment: Handouts: Caring for Your Ulcer Methods: Demonstration, Explain/Verbal Responses: State content correctly Electronic Signature(s) Signed: 01/26/2019 10:04:45 AM By: Gretta Cool, BSN, RN, CWS, Kim RN, BSN Entered By: Gretta Cool, BSN, RN, CWS, Kim on 01/24/2019 08:58:30 Kara [name], [name] ([id number]) -------------------------------------------------------------------------------- Wound Assessment Details Patient Name: JACKSON-Rosemond, Assia K. Date of Service: 01/24/2019 8:15 AM Medical Record Number: [id number] Patient Account Number: [id number] Date of Birth/Sex: 1950-10-26 (68 y.o. F) Treating RN: Montey Hora Primary Care Ajwa Kimberley: Tracie Harrier Other Clinician: Referring Yuriel Lopezmartinez: Tracie Harrier Treating Cheyan Frees/Extender: Tito Dine in Treatment: 3 Wound Status Wound Number: 1 Primary Diabetic Wound/Ulcer of the Lower Extremity Etiology: Wound Location: Left Lower Leg - Anterior Secondary Trauma, Other Wounding Event: Trauma Etiology: Date Acquired: 12/21/2018 Wound Open Weeks Of Treatment: 3 Status: Clustered Wound: No Comorbid Congestive Heart Failure, Deep Vein History: Thrombosis, Hypertension, Type II Diabetes, Osteoarthritis, Neuropathy Photos Wound Measurements Length: (cm) 3.4 % Reduction i Width: (cm) 1.3 % Reduction i Depth: (cm) 0.3 Epithelializa Area: (cm) 3.471 Tunneling: Volume: (cm) 1.041 Undermining: n Area: 35.4% n Volume: 72.3% tion: None No No Wound Description Classification: Grade 1 Foul Odor Aft Wound Margin: Flat and Intact  Slough/Fibrin Exudate Amount: Large Exudate Type: Serosanguineous Exudate Color: red, brown er Cleansing: No o Yes Wound Bed Granulation Amount: Large (67-100%) Exposed Structure Granulation Quality: Pink Fascia Exposed: No Necrotic Amount: Small (1-33%) Fat Layer (Subcutaneous Tissue) Exposed: Yes Necrotic Quality: Adherent Slough Tendon Exposed: No Muscle Exposed: No Joint Exposed: No Bone Exposed: No JACKSON-River[place], Paulyne K. ([id number]) Treatment Notes Wound #1 (Left, Anterior Lower Leg) Notes Left: Sliver [place], x-sorb, 3layer unna to anchor Electronic Signature(s) Signed: 01/24/2019 4:15:15 PM By: Montey Hora Entered By: Montey Hora on 01/24/2019 08:32:39 JACKSON-[place], Laiza K. ([id number]) -------------------------------------------------------------------------------- Vitals Details Patient Name: JACKSON-Bur, Camrynn K. Date of Service: 01/24/2019 8:15 AM Medical Record Number: [id number] Patient Account Number: [id number] Date of Birth/Sex: 1951-04-23 (68 y.o. F) Treating RN: Montey Hora Primary Care Jaedan Huttner: Tracie Harrier Other Clinician: Referring Trellis Guirguis: Tracie Harrier Treating Marsa Matteo/Extender: Tito Dine in Treatment: 3 Vital Signs Time Taken: 08:20 Temperature (F): 97.9 Height (in): 63 Pulse (bpm): 79 Weight (lbs): 227 Respiratory Rate (breaths/min): 16 Body Mass Index (BMI): 40.2 Blood Pressure (mmHg): 146/67 Reference Range: 80 - 120 mg / dl Electronic Signature(s) Signed: 01/24/2019 4:15:15 PM By: Montey Hora Entered By: Montey Hora on 01/24/2019 08:29:54

## 2019-01-31 ENCOUNTER — Ambulatory Visit: Payer: PPO | Admitting: Internal Medicine

## 2019-02-01 ENCOUNTER — Telehealth: Payer: Self-pay | Admitting: *Deleted

## 2019-02-01 ENCOUNTER — Encounter: Payer: PPO | Admitting: Physician Assistant

## 2019-02-01 ENCOUNTER — Encounter: Payer: Self-pay | Admitting: Pain Medicine

## 2019-02-01 ENCOUNTER — Other Ambulatory Visit: Payer: Self-pay

## 2019-02-01 DIAGNOSIS — L97812 Non-pressure chronic ulcer of other part of right lower leg with fat layer exposed: Secondary | ICD-10-CM | POA: Diagnosis not present

## 2019-02-01 DIAGNOSIS — R600 Localized edema: Secondary | ICD-10-CM | POA: Diagnosis not present

## 2019-02-01 DIAGNOSIS — L97822 Non-pressure chronic ulcer of other part of left lower leg with fat layer exposed: Secondary | ICD-10-CM | POA: Diagnosis not present

## 2019-02-01 DIAGNOSIS — E11622 Type 2 diabetes mellitus with other skin ulcer: Secondary | ICD-10-CM | POA: Diagnosis not present

## 2019-02-01 NOTE — Telephone Encounter (Signed)
Completed.

## 2019-02-01 NOTE — Telephone Encounter (Signed)
Attempted to call for pre appointment assessment. Message left.

## 2019-02-03 NOTE — Progress Notes (Signed)
STEPHENIA, VOGAN ([id number]) Visit Report for 02/01/2019 Chief Complaint Document Details Patient Name: Kara [name], [name] Date of Service: 02/01/2019 8:00 AM Medical Record Number: [id number] Patient Account Number: [id number] Date of Birth/Sex: [birth year] (68 y.o. F) Treating RN: Army Melia Primary Care Provider: Tracie Harrier Other Clinician: Referring Provider: Tracie Harrier Treating Provider/Extender: Melburn Hake, Aldona Bryner Weeks in Treatment: 4 Information Obtained from: Patient Chief Complaint Left Leg ulcer x 2 weeks Electronic Signature(s) Signed: 02/02/2019 6:01:04 PM By: Worthy Keeler PA-C Entered By: Worthy Keeler on 02/01/2019 08:28:53 JACKSON-Munyan, Daneshia K. ([id number]) -------------------------------------------------------------------------------- Debridement Details Patient Name: JACKSON-Traber, Mykiah K. Date of Service: 02/01/2019 8:00 AM Medical Record Number: [id number] Patient Account Number: [id number] Date of Birth/Sex: 25-Dec-1950 (68 y.o. F) Treating RN: Army Melia Primary Care Provider: Tracie Harrier Other Clinician: Referring Provider: Tracie Harrier Treating Provider/Extender: Melburn Hake, Monta Maiorana Weeks in Treatment: 4 Debridement Performed for Wound #2 Right,Lateral Lower Leg Assessment: Performed By: Physician STONE III, Cincere Zorn E., PA-C Debridement Type: Debridement Severity of Tissue Pre Fat layer exposed Debridement: Level of Consciousness (Pre- Awake and Alert procedure): Pre-procedure Verification/Time Yes - 08:37 Out Taken: Start Time: 08:38 Pain Control: Lidocaine Total Area Debrided (L x W): 0.5 (cm) x 0.5 (cm) = 0.25 (cm) Tissue and other material Viable, Non-Viable, Slough, Subcutaneous, Slough debrided: Level: Skin/Subcutaneous Tissue Debridement Description: Excisional Instrument: Curette Bleeding: Minimum Hemostasis Achieved: Pressure End Time: 08:40 Response to Treatment: Procedure was tolerated  well Level of Consciousness Awake and Alert (Post-procedure): Post Debridement Measurements of Total Wound Length: (cm) 0.5 Width: (cm) 0.5 Depth: (cm) 0.3 Volume: (cm) 0.059 Character of Wound/Ulcer Post Debridement: Stable Severity of Tissue Post Debridement: Fat layer exposed Post Procedure Diagnosis Same as Pre-procedure Electronic Signature(s) Signed: 02/01/2019 1:49:14 PM By: Army Melia Signed: 02/02/2019 6:01:04 PM By: Worthy Keeler PA-C Entered By: Army Melia on 02/01/2019 08:40:55 JACKSON-Stelzner, Teressa K. ([id number]) -------------------------------------------------------------------------------- HPI Details Patient Name: JACKSON-Mccaffrey, Daysie K. Date of Service: 02/01/2019 8:00 AM Medical Record Number: [id number] Patient Account Number: [id number] Date of Birth/Sex: 04/08/1951 (68 y.o. F) Treating RN: Army Melia Primary Care Provider: Tracie Harrier Other Clinician: Referring Provider: Tracie Harrier Treating Provider/Extender: Melburn Hake, Ruben Pyka Weeks in Treatment: 4 History of Present Illness HPI Description: Pleasant 68 year old Caucasian female who fell down a few steps in her home 2 weeks ago and developed bruises on both legs on the shins, on the left shin area she did have an open area that scabbed, patient did ED on 8/1 at which point she had dressing applied to the left shin wound, she had a right shin hematoma that was lanced with oozing of blood after. Since that time patient has noticed that the left calf leg wound developed a black covering on top and had started to hurt, she has been dressing this daily with an antibiotic ointment and gauze wrap. 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 Since a week now she has been on Keflex 500 twice daily prescribed by her PCP Patient's history significant for type 2 diabetes, remote history of DVT for which she is on Coumadin, Also remote  history of PE for which she has been on Coumadin indefinitely, essential hypertension, history of colon cancer, history of CHF that is compensated, history of back pain ABIs today in the clinic 1.04 on the right 1.07 on the left 01/10/2019; patient admitted to the clinic last week. She had a traumatic wound on the left  lower extremity. We have been using silver alginate because of drainage. She has been in compression 8/26; this was initially a traumatic wound on the left lower extremity. It has some depth superiorly appears to have come in this week. We have been using silver alginate because of drainage 01/24/19 once again the wrap seems to have come down and she took it off and put Vaseline on the wound on Sunday. She did not call the clinic. About 75% of this is fully granulated 25% is not and that is superiorly. We have been using silver alginate but all changed to silver collagen today. 02/01/19 on evaluation today patient appears to be doing okay in regard to her left anterior lower extremity ulcer. She also has a right lower leg ulcer which really has not been monitored as much at this point due to the fact that it appeared to be doing quite well according to what that [facility] felt. Nonetheless there appears to be some fluid that is leaking from the right side as well upon evaluation today. I think this may need to be addressed at this point unfortunately. I don't think it's just gonna heal on its own at least not very effectively. No fevers, chills, nausea, or vomiting noted at this time. Electronic Signature(s) Signed: 02/02/2019 6:01:04 PM By: Worthy Keeler PA-C Entered By: Worthy Keeler on 02/02/2019 00:54:32 JACKSON-Maclellan, Brayley K. ([id number]) -------------------------------------------------------------------------------- Physical Exam Details Patient Name: JACKSON-Chasteen, Deretha K. Date of Service: 02/01/2019 8:00 AM Medical Record Number: [id number] Patient Account Number:  [id number] Date of Birth/Sex: 1950-07-23 (68 y.o. F) Treating RN: Army Melia Primary Care Provider: Tracie Harrier Other Clinician: Referring Provider: Tracie Harrier Treating Provider/Extender: STONE III, Elison Worrel Weeks in Treatment: 4 Constitutional Well-nourished and well-hydrated in no acute distress. Respiratory normal breathing without difficulty. Psychiatric this patient is able to make decisions and demonstrates good insight into disease process. Alert and Oriented x 3. pleasant and cooperative. Notes Upon inspection patient's wound on the left lower extremity dishes signs of improvement which is good news. With that being said the wound on the right lower extremity actually have some necrotic tissue over the surface of the wound which then subsequently wants debrided revealed an area of undermining with fluid collection. Fortunately there's no signs of active infection at this time which is good news. Debridement was undertaken with regard to the right lower extremity she tolerated this without complication. Patient does have bilateral lower extremity edema. Electronic Signature(s) Signed: 02/02/2019 6:01:04 PM By: Worthy Keeler PA-C Entered By: Worthy Keeler on 02/02/2019 00:55:12 JACKSON-Toft, Joneisha K. ([id number]) -------------------------------------------------------------------------------- Physician Orders Details Patient Name: JACKSON-Parsell, Bryanna K. Date of Service: 02/01/2019 8:00 AM Medical Record Number: [id number] Patient Account Number: [id number] Date of Birth/Sex: [birth year] (68 y.o. F) Treating RN: Army Melia Primary Care Provider: Tracie Harrier Other Clinician: Referring Provider: Tracie Harrier Treating Provider/Extender: Melburn Hake, Tandy Grawe Weeks in Treatment: 4 Verbal / Phone Orders: No Diagnosis Coding ICD-10 Coding Code Description [phone] Non-pressure chronic ulcer of left calf with fat layer exposed E11.9 Type 2 diabetes mellitus  without complications Wound Cleansing Wound #1 Left,Anterior Lower Leg o May shower with protection. Wound #2 Right,Lateral Lower Leg o May shower with protection. Anesthetic (add to Medication List) Wound #1 Left,Anterior Lower Leg o Topical Lidocaine 4% cream applied to wound bed prior to debridement (In Clinic Only). Wound #2 Right,Lateral Lower Leg o Topical Lidocaine 4% cream applied to wound bed prior to debridement (In Clinic Only). Skin Barriers/Peri-Wound Care Wound #1 Left,Anterior  Lower Leg o Barrier cream Primary Wound Dressing Wound #1 Left,Anterior Lower Leg o Silver Collagen Wound #2 Right,Lateral Lower Leg o Silver Alginate Secondary Dressing Wound #1 Left,Anterior Lower Leg o Other - Xtrasorb Wound #2 Right,Lateral Lower Leg o ABD pad o ABD and Kerlix/Conform Dressing Change Frequency Wound #1 Left,Anterior Lower Leg o Change dressing every week JACKSON-Diedrich, Bitania K. ([id number]) Wound #2 Right,Lateral Lower Leg o Change dressing every day. Follow-up Appointments Wound #1 Left,Anterior Lower Leg o Return Appointment in 1 week. o Nurse Visit as needed Wound #2 Right,Lateral Lower Leg o Return Appointment in 1 week. o Nurse Visit as needed Edema Control Wound #1 Left,Anterior Lower Leg o 3 Layer Compression System - Left Lower Extremity - unna to anchor o Elevate legs to the level of the heart and pump ankles as often as possible Wound #2 Right,Lateral Lower Leg o Other: - double layer tubi grip F Electronic Signature(s) Signed: 02/01/2019 1:49:14 PM By: Army Melia Signed: 02/02/2019 6:01:04 PM By: Worthy Keeler PA-C Entered By: Army Melia on 02/01/2019 08:45:19 JACKSON-Delisa, Maloni K. ([id number]) -------------------------------------------------------------------------------- Problem List Details Patient Name: JACKSON-Bolla, Princess K. Date of Service: 02/01/2019 8:00 AM Medical Record Number:  [id number] Patient Account Number: [id number] Date of Birth/Sex: [birth year] (68 y.o. F) Treating RN: Army Melia Primary Care Provider: Tracie Harrier Other Clinician: Referring Provider: Tracie Harrier Treating Provider/Extender: Melburn Hake, Ninoshka Wainwright Weeks in Treatment: 4 Active Problems ICD-10 Evaluated Encounter Code Description Active Date Today Diagnosis E11.9 Type 2 diabetes mellitus without complications 8/54/6270 No Yes L97.222 Non-pressure chronic ulcer of left calf with fat layer exposed 01/03/2019 No Yes L97.812 Non-pressure chronic ulcer of other part of right lower leg 02/02/2019 No Yes with fat layer exposed Inactive Problems Resolved Problems Electronic Signature(s) Signed: 02/02/2019 2:34:54 PM By: Worthy Keeler PA-C Entered By: Worthy Keeler on 02/02/2019 14:34:54 JACKSON-Foister, Blandina K. ([id number]) -------------------------------------------------------------------------------- Progress Note Details Patient Name: JACKSON-Robles, Ailen K. Date of Service: 02/01/2019 8:00 AM Medical Record Number: [id number] Patient Account Number: [id number] Date of Birth/Sex: 01-20-1951 (68 y.o. F) Treating RN: Army Melia Primary Care Provider: Tracie Harrier Other Clinician: Referring Provider: Tracie Harrier Treating Provider/Extender: Melburn Hake, Anjuli Gemmill Weeks in Treatment: 4 Subjective Chief Complaint Information obtained from Patient Left Leg ulcer x 2 weeks History of Present Illness (HPI) Pleasant 68 year old Caucasian female who fell down a few steps in her home 2 weeks ago and developed bruises on both legs on the shins, on the left shin area she did have an open area that scabbed, patient did ED on 8/1 at which point she had dressing applied to the left shin wound, she had a right shin hematoma that was lanced with oozing of blood after. Since that time patient has noticed that the left calf leg wound developed a black covering on top and had started to hurt,  she has been dressing this daily with an antibiotic ointment and gauze wrap. 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 Since a week now she has been on Keflex 500 twice daily prescribed by her PCP Patient's history significant for type 2 diabetes, remote history of DVT for which she is on Coumadin, Also remote history of PE for which she has been on Coumadin indefinitely, essential hypertension, history of colon cancer, history of CHF that is compensated, history of back pain ABIs today in the clinic 1.04 on the right 1.07 on the left 01/10/2019; patient admitted  to the clinic last week. She had a traumatic wound on the left lower extremity. We have been using silver alginate because of drainage. She has been in compression 8/26; this was initially a traumatic wound on the left lower extremity. It has some depth superiorly appears to have come in this week. We have been using silver alginate because of drainage 01/24/19 once again the wrap seems to have come down and she took it off and put Vaseline on the wound on Sunday. She did not call the clinic. About 75% of this is fully granulated 25% is not and that is superiorly. We have been using silver alginate but all changed to silver collagen today. 02/01/19 on evaluation today patient appears to be doing okay in regard to her left anterior lower extremity ulcer. She also has a right lower leg ulcer which really has not been monitored as much at this point due to the fact that it appeared to be doing quite well according to what that [facility] felt. Nonetheless there appears to be some fluid that is leaking from the right side as well upon evaluation today. I think this may need to be addressed at this point unfortunately. I don't think it's just gonna heal on its own at least not very effectively. No fevers, chills, nausea, or vomiting noted at this time. Patient History Information  obtained from Patient. Family History Diabetes - Siblings, No family history of Cancer, Heart Disease, Hereditary Spherocytosis, Hypertension, Kidney Disease, Lung Disease, Seizures, Stroke, Thyroid Problems, Tuberculosis. Social History Never smoker, Marital Status - Married, Alcohol Use - Never, Drug Use - No History, Caffeine Use - Daily. Medical History Respiratory Denies history of Aspiration, Asthma, Chronic Obstructive Pulmonary Disease (COPD), Pneumothorax, Sleep Apnea, JACKSON-Taketa, Grete K. ([id number]) Tuberculosis Cardiovascular Patient has history of Congestive Heart Failure, Deep Vein Thrombosis, Hypertension Denies history of Angina, Arrhythmia, Coronary Artery Disease, Hypotension, Myocardial Infarction, Peripheral Arterial Disease, Peripheral Venous Disease, Phlebitis, Vasculitis Endocrine Patient has history of Type II Diabetes Denies history of Type I Diabetes Integumentary (Skin) Denies history of History of Burn, History of pressure wounds Musculoskeletal Patient has history of Osteoarthritis Denies history of Gout, Rheumatoid Arthritis, Osteomyelitis Neurologic Patient has history of Neuropathy Denies history of Dementia, Quadriplegia, Paraplegia, Seizure Disorder Medical And Surgical History Notes Respiratory hx pulmonary embolism Musculoskeletal OP Review of Systems (ROS) Constitutional Symptoms (General Health) Denies complaints or symptoms of Fatigue, Fever, Chills, Marked Weight Change. Respiratory Denies complaints or symptoms of Chronic or frequent coughs, Shortness of Breath. Cardiovascular Complains or has symptoms of LE edema. Denies complaints or symptoms of Chest pain. Psychiatric Denies complaints or symptoms of Anxiety, Claustrophobia. Objective Constitutional Well-nourished and well-hydrated in no acute distress. Vitals Time Taken: 8:05 AM, Height: 63 in, Weight: 227 lbs, BMI: 40.2, Temperature: 98.2 F, Pulse: 85 bpm,  Respiratory Rate: 16 breaths/min, Blood Pressure: 142/61 mmHg. Respiratory normal breathing without difficulty. Psychiatric this patient is able to make decisions and demonstrates good insight into disease process. Alert and Oriented x 3. pleasant and cooperative. General Notes: Upon inspection patient's wound on the left lower extremity dishes signs of improvement which is good news. JACKSON-Yeoman, Channelle K. ([id number]) With that being said the wound on the right lower extremity actually have some necrotic tissue over the surface of the wound which then subsequently wants debrided revealed an area of undermining with fluid collection. Fortunately there's no signs of active infection at this time which is good news. Debridement was undertaken with regard to the right lower extremity  she tolerated this without complication. Patient does have bilateral lower extremity edema. Integumentary (Hair, Skin) Wound #1 status is Open. Original cause of wound was Trauma. The wound is located on the Left,Anterior Lower Leg. The wound measures 2.8cm length x 0.9cm width x 0.3cm depth; 1.979cm^2 area and 0.594cm^3 volume. There is Fat Layer (Subcutaneous Tissue) Exposed exposed. There is no tunneling or undermining noted. There is a large amount of serosanguineous drainage noted. The wound margin is flat and intact. There is large (67-100%) pink granulation within the wound bed. There is a small (1-33%) amount of necrotic tissue within the wound bed including Adherent Slough. General Notes: periwound is very excoriated and irritated appearing Wound #2 status is Open. Original cause of wound was Gradually Appeared. The wound is located on the Right,Lateral Lower Leg. The wound measures 0.5cm length x 0.5cm width x 0.1cm depth; 0.196cm^2 area and 0.02cm^3 volume. There is Fat Layer (Subcutaneous Tissue) Exposed exposed. There is no tunneling or undermining noted. There is a large amount of serous drainage  noted. The wound margin is flat and intact. There is small (1-33%) pink granulation within the wound bed. There is a large (67-100%) amount of necrotic tissue within the wound bed including Adherent Slough. 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 There was a Excisional Skin/Subcutaneous Tissue Debridement with a total area of 0.25 sq cm performed by STONE III, Kashia Brossard E., PA-C. With the following instrument(s): Curette to remove Viable and Non-Viable tissue/material. Material removed includes Subcutaneous Tissue and Slough and after achieving pain control using Lidocaine. A time out was conducted at 08:37, prior to the start of the procedure. A Minimum amount of bleeding was controlled with Pressure. The procedure was tolerated well. Post Debridement Measurements: 0.5cm length x 0.5cm width x 0.3cm depth; 0.059cm^3 volume. Character of Wound/Ulcer Post Debridement is stable. Severity of Tissue Post Debridement is: Fat layer exposed. Post procedure Diagnosis Wound #2: Same as Pre-Procedure Wound #1 Pre-procedure diagnosis of Wound #1 is a Diabetic Wound/Ulcer of the Lower Extremity located on the Left,Anterior Lower Leg . There was a Three Layer Compression Therapy Procedure with a pre-treatment ABI of 1.1 by Army Melia, RN. Post procedure Diagnosis Wound #1: Same as Pre-Procedure JACKSON-Fross, Barrett K. ([id number]) Plan Wound Cleansing: Wound #1 Left,Anterior Lower Leg: May shower with protection. Wound #2 Right,Lateral Lower Leg: May shower with protection. Anesthetic (add to Medication List): Wound  #1 Left,Anterior Lower Leg: Topical Lidocaine 4% cream applied to wound bed prior to debridement (In Clinic Only). Wound #2 Right,Lateral Lower Leg: Topical Lidocaine 4% cream applied to wound bed prior to debridement (In Clinic Only). Skin Barriers/Peri-Wound Care: Wound #1 Left,Anterior Lower Leg: Barrier cream Primary Wound Dressing: Wound #1 Left,Anterior Lower Leg: Silver Collagen Wound #2 Right,Lateral Lower Leg: Silver Alginate Secondary Dressing: Wound #1 Left,Anterior Lower Leg: Other - Xtrasorb Wound #2 Right,Lateral Lower Leg: ABD pad ABD and Kerlix/Conform Dressing Change Frequency: Wound #1 Left,Anterior Lower Leg: Change dressing every week Wound #2 Right,Lateral Lower Leg: Change dressing every day. Follow-up Appointments: Wound #1 Left,Anterior Lower Leg: Return Appointment in 1 week. Nurse Visit as needed Wound #2 Right,Lateral Lower Leg: Return Appointment in 1 week.  Nurse Visit as needed Edema Control: Wound #1 Left,Anterior Lower Leg: 3 Layer Compression System - Left Lower Extremity - unna to anchor Elevate legs to the level of the heart and pump ankles as often as possible Wound #2 Right,Lateral Lower Leg: Other: - double layer tubi grip F 1. My recommendation currently is that we go ahead and initiate a silver collagen dressing for the left anterior lower leg. I think this is absolutely appropriate hopefully should help in this regard. In regard to the right lateral lower leg we will utilize silver alginate currently. 2. I am going to suggest as well that we continue with a 3 layer compression wrap for the left lower extremity although the patient really does not want to initiate a 3 layer compression wrap for the right at this time. For that reason I am going to suggest that we actually go ahead and utilize Tubigrip for the right side that way she at least get some compression to help this area to heal more appropriately. 3. I do recommend the  patient continue to elevate her legs as much as possible. JACKSON-Granillo, Afsheen K. ([id number]) We will see patient back for reevaluation in 1 week here in the clinic. If anything worsens or changes patient will contact our office for additional recommendations. Electronic Signature(s) Signed: 02/02/2019 2:35:13 PM By: Worthy Keeler PA-C Previous Signature: 02/02/2019 2:33:55 PM Version By: Worthy Keeler PA-C Entered By: Worthy Keeler on 02/02/2019 14:35:13 JACKSON-Huckeby, Morghan K. ([id number]) -------------------------------------------------------------------------------- ROS/PFSH Details Patient Name: JACKSON-Kenner, Suzzette K. Date of Service: 02/01/2019 8:00 AM Medical Record Number: [id number] Patient Account Number: [id number] Date of Birth/Sex: August 21, 1950 (68 y.o. F) Treating RN: Army Melia Primary Care Provider: Tracie Harrier Other Clinician: Referring Provider: Tracie Harrier Treating Provider/Extender: Melburn Hake, Jazzmin Newbold Weeks in Treatment: 4 Information Obtained From Patient Constitutional Symptoms (General Health) Complaints and Symptoms: Negative for: Fatigue; Fever; Chills; Marked Weight Change Respiratory Complaints and Symptoms: Negative for: Chronic or frequent coughs; Shortness of Breath Medical History: Negative for: Aspiration; Asthma; Chronic Obstructive Pulmonary Disease (COPD); Pneumothorax; Sleep Apnea; Tuberculosis Past Medical History Notes: hx pulmonary embolism Cardiovascular Complaints and Symptoms: Positive for: LE edema Negative for: Chest pain Medical History: Positive for: Congestive Heart Failure; Deep Vein Thrombosis; Hypertension Negative for: Angina; Arrhythmia; Coronary Artery Disease; Hypotension; Myocardial Infarction; Peripheral Arterial Disease; Peripheral Venous Disease; Phlebitis; Vasculitis Psychiatric Complaints and Symptoms: Negative for: Anxiety; Claustrophobia Endocrine Medical History: Positive for: Type II  Diabetes Negative for: Type I Diabetes Time with diabetes: 5 years Treated with: Oral agents Blood sugar tested every day: Yes Tested : QD Integumentary (Skin) Medical History: Negative for: History of Burn; History of pressure wounds JACKSON-Ramires, Idella K. ([id number]) Musculoskeletal Medical History: Positive for: Osteoarthritis Negative for: Gout; Rheumatoid Arthritis; Osteomyelitis Past Medical History Notes: OP Neurologic Medical History: Positive for: Neuropathy Negative for: Dementia; Quadriplegia; Paraplegia; Seizure Disorder Immunizations Pneumococcal Vaccine: Received Pneumococcal Vaccination: Yes Implantable Devices None Family and Social History Cancer: No; Diabetes: Yes - Siblings; Heart Disease: No; Hereditary Spherocytosis: No; Hypertension: No; Kidney Disease: No; Lung Disease: No; Seizures: No; Stroke: No; Thyroid Problems: No; Tuberculosis: No; Never smoker; Marital Status - Married; Alcohol Use: Never; Drug Use: No History; Caffeine Use: Daily; Financial Concerns: No; Food, Clothing or Shelter Needs: No; Support System Lacking: No; Transportation Concerns: No Physician Affirmation I have reviewed and agree with the above information. Electronic Signature(s) Signed: 02/02/2019 11:53:25 AM By: Army Melia Signed: 02/02/2019 6:01:04 PM By: Worthy Keeler PA-C Entered By: Worthy Keeler  on 02/02/2019 00:54:54 JACKSON-Karp, Stepahnie K. ([id number]) -------------------------------------------------------------------------------- SuperBill Details Patient Name: JACKSON-Fagin, Latonia K. Date of Service: 02/01/2019 Medical Record Number: [id number] Patient Account Number: [id number] Date of Birth/Sex: 1950/09/19 (68 y.o. 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

## 2019-02-03 NOTE — Progress Notes (Signed)
KASHAY, CAVENAUGH ([id number]) Visit Report for 02/01/2019 Arrival Information Details Patient Name: MAUCERI, Felise K. Date of Service: 02/01/2019 8:00 AM Medical Record Number: [id number] Patient Account Number: [id number] Date of Birth/Sex: 10-Jun-1950 (68 y.o. F) Treating RN: Army Melia Primary Care Kaylia Winborne: Tracie Harrier Other Clinician: Referring Cheyann Blecha: Tracie Harrier Treating Keileigh Vahey/Extender: Melburn Hake, HOYT Weeks in Treatment: 4 Visit Information History Since Last Visit Added or deleted any medications: No Patient Arrived: Cane Any new allergies or adverse reactions: No Arrival Time: 08:04 Had a fall or experienced change in No Accompanied By: self activities of daily living that may affect Transfer Assistance: None risk of falls: Patient Identification Verified: Yes Signs or symptoms of abuse/neglect since last visito No Secondary Verification Process Yes Hospitalized since last visit: No Completed: Implantable device outside of the clinic excluding No Patient Has Alerts: Yes cellular tissue based products placed in the center Patient Alerts: Patient on Blood since last visit: Thinner Has Dressing in Place as Prescribed: Yes Warfarin Has Compression in Place as Prescribed: Yes DMII Pain Present Now: No Electronic Signature(s) Signed: 02/01/2019 10:39:52 AM By: Lorine Bears RCP, RRT, CHT Entered By: Lorine Bears on 02/01/2019 08:06:28 JACKSON-Michaelsen, Rena K. ([id number]) -------------------------------------------------------------------------------- Compression Therapy Details Patient Name: JACKSON-Neubecker, Sugey K. Date of Service: 02/01/2019 8:00 AM Medical Record Number: [id number] Patient Account Number: [id number] Date of Birth/Sex: 1950/05/26 (68 y.o. F) Treating RN: Army Melia Primary Care Celie Desrochers: Tracie Harrier Other Clinician: Referring Mannat Benedetti: Tracie Harrier Treating  Taren Dymek/Extender: Melburn Hake, HOYT Weeks in Treatment: 4 Compression Therapy Performed for Wound Assessment: Wound #1 Left,Anterior Lower Leg Performed By: Clinician Army Melia, RN Compression Type: Three Layer Pre Treatment ABI: 1.1 Post Procedure Diagnosis Same as Pre-procedure Electronic Signature(s) Signed: 02/01/2019 1:49:14 PM By: Army Melia Entered By: Army Melia on 02/01/2019 08:41:15 JACKSON-Tendler, Beverly K. ([id number]) -------------------------------------------------------------------------------- Lower Extremity Assessment Details Patient Name: JACKSON-Borras, Ledia K. Date of Service: 02/01/2019 8:00 AM Medical Record Number: [id number] Patient Account Number: [id number] Date of Birth/Sex: 08/24/1950 (68 y.o. F) Treating RN: Montey Hora Primary Care Nimesh Riolo: Tracie Harrier Other Clinician: Referring Jarell Mcewen: Tracie Harrier Treating Rydell Wiegel/Extender: Melburn Hake, HOYT Weeks in Treatment: 4 Edema Assessment Assessed: [Left: No] [Right: No] Edema: [Left: No] [Right: No] Calf Left: Right: Point of Measurement: 32 cm From Medial Instep 37.2 cm 41 cm Ankle Left: Right: Point of Measurement: 9 cm From Medial Instep 19.3 cm 20.5 cm Vascular Assessment Pulses: Dorsalis Pedis Palpable: [Left:Yes] [Right:Yes] Electronic Signature(s) Signed: 02/01/2019 4:59:38 PM By: Montey Hora Entered By: Montey Hora on 02/01/2019 08:20:13 JACKSON-Hinchman, Keileigh K. ([id number]) -------------------------------------------------------------------------------- Multi Wound Chart Details Patient Name: JACKSON-Sawka, Laurinda K. Date of Service: 02/01/2019 8:00 AM Medical Record Number: [id number] Patient Account Number: [id number] Date of Birth/Sex: [birth year] (68 y.o. F) Treating RN: Army Melia Primary Care Donte Lenzo: Tracie Harrier Other Clinician: Referring Sweet Jarvis: Tracie Harrier Treating Keeven Matty/Extender: Melburn Hake, HOYT Weeks in Treatment: 4 Vital  Signs Height(in): 63 Pulse(bpm): 85 Weight(lbs): 227 Blood Pressure(mmHg): 142/61 Body Mass Index(BMI): 40 Temperature(F): 98.2 Respiratory Rate 16 (breaths/min): Photos: [N/A:N/A] Wound Location: Left Lower Leg - Anterior Right Lower Leg - Lateral N/A Wounding Event: Trauma Gradually Appeared N/A Primary Etiology: Diabetic Wound/Ulcer of the Diabetic Wound/Ulcer of the N/A Lower Extremity Lower Extremity Secondary Etiology: Trauma, Other N/A N/A Comorbid History: Congestive Heart Failure, Congestive Heart Failure, N/A Deep Vein Thrombosis, Deep Vein Thrombosis, Hypertension, Type II Hypertension, Type II Diabetes, Osteoarthritis, Diabetes, Osteoarthritis, Neuropathy Neuropathy Date Acquired: 12/21/2018 01/30/2019 N/A Weeks of Treatment: 4 0 N/A Wound Status: Open Open N/A  Measurements L x W x D 2.8x0.9x0.3 0.5x0.5x0.1 N/A (cm) Area (cm) : 1.979 0.196 N/A Volume (cm) : 0.594 0.02 N/A % Reduction in Area: 63.20% N/A N/A % Reduction in Volume: 84.20% N/A N/A Classification: Grade 1 Grade 1 N/A Exudate Amount: Large Large N/A Exudate Type: Serosanguineous Serous N/A Exudate Color: red, brown amber N/A Wound Margin: Flat and Intact Flat and Intact N/A Granulation Amount: Large (67-100%) Small (1-33%) N/A Granulation Quality: Pink Pink N/A Necrotic Amount: Small (1-33%) Large (67-100%) N/A Exposed Structures: Fat Layer (Subcutaneous Fat Layer (Subcutaneous N/A Tissue) Exposed: Yes Tissue) Exposed: Yes JACKSON-Assefa, Aubryn K. ([id number]) Fascia: No Fascia: No Tendon: No Tendon: No Muscle: No Muscle: No Joint: No Joint: No Bone: No Bone: No Epithelialization: None None N/A Assessment Notes: periwound is very excoriated N/A N/A and irritated appearing Treatment Notes Electronic Signature(s) Signed: 02/01/2019 1:49:14 PM By: Army Melia Entered By: Army Melia on 02/01/2019 08:31:13 JACKSON-Cofer, Rondalyn K.  ([id number]) -------------------------------------------------------------------------------- [place] Details Patient Name: JACKSON-Khader, Dallie K. Date of Service: 02/01/2019 8:00 AM Medical Record Number: [id number] Patient Account Number: [id number] Date of Birth/Sex: 02-24-1951 (68 y.o. F) Treating RN: Army Melia Primary Care Neelah Mannings: Tracie Harrier Other Clinician: Referring Jimmi Sidener: Tracie Harrier Treating Cailee Blanke/Extender: Melburn Hake, HOYT Weeks in Treatment: 4 Active Inactive Abuse / Safety / Falls / Self Care Management Nursing Diagnoses: History of Falls Goals: Patient will not experience any injury related to falls Date Initiated: 01/03/2019 Target Resolution Date: 01/17/2019 Goal Status: Active Interventions: Assess fall risk on admission and as needed Notes: Medication Nursing Diagnoses: Knowledge deficit related to medication safety: actual or potential Goals: Patient/caregiver will demonstrate understanding of all current medications Date Initiated: 01/03/2019 Target Resolution Date: 01/03/2019 Goal Status: Active Interventions: Patient/Caregiver given reconciled medication list upon admission, changes in medications and discharge from the [facility] Notes: Necrotic Tissue Nursing Diagnoses: Impaired tissue integrity related to necrotic/devitalized tissue Goals: Necrotic/devitalized tissue will be minimized in the wound bed Date Initiated: 01/03/2019 Target Resolution Date: 01/10/2019 Goal Status: Active Interventions: JACKSON-Vonruden, Quinnie K. ([id number]) Assess patient pain level pre-, during and post procedure and prior to discharge Treatment Activities: Apply topical anesthetic as ordered : 01/03/2019 Excisional debridement : 01/03/2019 Notes: Wound/Skin Impairment Nursing Diagnoses: Impaired tissue integrity Goals: Ulcer/skin breakdown will have a volume reduction of 30% by week 4 Date Initiated: 01/03/2019 Target  Resolution Date: 02/03/2019 Goal Status: Active Interventions: Assess patient/caregiver ability to obtain necessary supplies Assess ulceration(s) every visit Treatment Activities: Skin care regimen initiated : 01/03/2019 Topical wound management initiated : 01/03/2019 Notes: Electronic Signature(s) Signed: 02/01/2019 1:49:14 PM By: Army Melia Entered By: Army Melia on 02/01/2019 08:31:01 JACKSON-Maricle, Bridgid K. ([id number]) -------------------------------------------------------------------------------- Pain Assessment Details Patient Name: JACKSON-Bojarski, Dasani K. Date of Service: 02/01/2019 8:00 AM Medical Record Number: [id number] Patient Account Number: [id number] Date of Birth/Sex: 1951/01/25 (68 y.o. F) Treating RN: Army Melia Primary Care Fae Blossom: Tracie Harrier Other Clinician: Referring Airiel Oblinger: Tracie Harrier Treating Aliesha Dolata/Extender: Melburn Hake, HOYT Weeks in Treatment: 4 Active Problems Location of Pain Severity and Description of Pain Patient Has Paino No Site Locations Pain Management and Medication Current Pain Management: Electronic Signature(s) Signed: 02/01/2019 10:39:52 AM By: Lorine Bears RCP, RRT, CHT Signed: 02/01/2019 1:49:14 PM By: Army Melia Entered By: Lorine Bears on 02/01/2019 08:06:36 JACKSON-Botto, Anelly K. ([id number]) -------------------------------------------------------------------------------- Patient/Caregiver Education Details Patient Name: JACKSON-Difiore, Anel K. Date of Service: 02/01/2019 8:00 AM Medical Record Number: [id number] Patient Account Number: [id number] Date of Birth/Gender: 1951-03-17 (68 y.o. F) Treating RN: Army Melia Primary Care Physician:  Hande, Vishwanath Other Clinician: Referring Physician: Tracie Harrier Treating Physician/Extender: Melburn Hake, HOYT Weeks in Treatment: 4 Education Assessment Education Provided To: Patient Education Topics Provided Wound/Skin  Impairment: Handouts: Caring for Your Ulcer Methods: Demonstration, Explain/Verbal Responses: State content correctly Electronic Signature(s) Signed: 02/01/2019 1:49:14 PM By: Army Melia Entered By: Army Melia on 02/01/2019 08:45:39 [place], Melanny K. ([id number]) -------------------------------------------------------------------------------- Wound Assessment Details Patient Name: JACKSON-Wanek, Dayona K. Date of Service: 02/01/2019 8:00 AM Medical Record Number: [id number] Patient Account Number: [id number] Date of Birth/Sex: 01-26-1951 (68 y.o. F) Treating RN: Montey Hora Primary Care Don Tiu: Tracie Harrier Other Clinician: Referring Jhaniya Briski: Tracie Harrier Treating Javonte Elenes/Extender: STONE III, HOYT Weeks in Treatment: 4 Wound Status Wound Number: 1 Primary Diabetic Wound/Ulcer of the Lower Extremity Etiology: Wound Location: Left Lower Leg - Anterior Secondary Trauma, Other Wounding Event: Trauma Etiology: Date Acquired: 12/21/2018 Wound Open Weeks Of Treatment: 4 Status: Clustered Wound: No Comorbid Congestive Heart Failure, Deep Vein History: Thrombosis, Hypertension, Type II Diabetes, Osteoarthritis, Neuropathy Photos Wound Measurements Length: (cm) 2.8 % Reduction i Width: (cm) 0.9 % Reduction i Depth: (cm) 0.3 Epithelializa Area: (cm) 1.979 Tunneling: Volume: (cm) 0.594 Undermining: n Area: 63.2% n Volume: 84.2% tion: None No No Wound Description Classification: Grade 1 Foul Odor Aft Wound Margin: Flat and Intact Slough/Fibrin Exudate Amount: Large Exudate Type: Serosanguineous Exudate Color: red, brown er Cleansing: No o Yes Wound Bed Granulation Amount: Large (67-100%) Exposed Structure Granulation Quality: Pink Fascia Exposed: No Necrotic Amount: Small (1-33%) Fat Layer (Subcutaneous Tissue) Exposed: Yes Necrotic Quality: Adherent Slough Tendon Exposed: No Muscle Exposed: No Joint Exposed: No Bone Exposed:  No JACKSON-Jaquay, Marlena K. ([id number]) Assessment Notes periwound is very excoriated and irritated appearing Electronic Signature(s) Signed: 02/01/2019 4:59:38 PM By: Montey Hora Entered By: Montey Hora on 02/01/2019 08:18:38 JACKSON-Aulds, Kyannah K. ([id number]) -------------------------------------------------------------------------------- Wound Assessment Details Patient Name: JACKSON-Ozburn, Lillyanne K. Date of Service: 02/01/2019 8:00 AM Medical Record Number: [id number] Patient Account Number: [id number] Date of Birth/Sex: 10-08-1950 (68 y.o. F) Treating RN: Montey Hora Primary Care Vista Sawatzky: Tracie Harrier Other Clinician: Referring Thekla Colborn: Tracie Harrier Treating Laetitia Schnepf/Extender: STONE III, HOYT Weeks in Treatment: 4 Wound Status Wound Number: 2 Primary Diabetic Wound/Ulcer of the Lower Extremity Etiology: Wound Location: Right Lower Leg - Lateral Wound Open Wounding Event: Gradually Appeared Status: Date Acquired: 01/30/2019 Comorbid Congestive Heart Failure, Deep Vein Weeks Of Treatment: 0 History: Thrombosis, Hypertension, Type II Diabetes, Clustered Wound: No Osteoarthritis, Neuropathy Photos Wound Measurements Length: (cm) 0.5 % Reduction i Width: (cm) 0.5 % Reduction i Depth: (cm) 0.1 Epithelializa Area: (cm) 0.196 Tunneling: Volume: (cm) 0.02 Undermining: n Area: n Volume: tion: None No No Wound Description Classification: Grade 1 Foul Odor Aft Wound Margin: Flat and Intact Slough/Fibrin Exudate Amount: Large Exudate Type: Serous Exudate Color: amber er Cleansing: No o Yes Wound Bed Granulation Amount: Small (1-33%) Exposed Structure Granulation Quality: Pink Fascia Exposed: No Necrotic Amount: Large (67-100%) Fat Layer (Subcutaneous Tissue) Exposed: Yes Necrotic Quality: Adherent Slough Tendon Exposed: No Muscle Exposed: No Joint Exposed: No Bone Exposed: No Electronic Signature(s) JACKSON-Occhipinti, Presley K.  ([id number]) Signed: 02/01/2019 4:59:38 PM By: Montey Hora Entered By: Montey Hora on 02/01/2019 08:14:08 JACKSON-Anthis, Lauren K. ([id number]) -------------------------------------------------------------------------------- Vitals Details Patient Name: JACKSON-Rockhill, Yuritzi K. Date of Service: 02/01/2019 8:00 AM Medical Record Number: [id number] Patient Account Number: [id number] Date of Birth/Sex: 1950/11/13 (68 y.o. F) Treating RN: Army Melia Primary Care Maddock Finigan: Tracie Harrier Other Clinician: Referring Tamantha Saline: Tracie Harrier Treating Heyli Min/Extender: STONE III, HOYT Weeks in Treatment: 4 Vital Signs Time Taken: 08:05  Temperature (F): 98.2 Height (in): 63 Pulse (bpm): 85 Weight (lbs): 227 Respiratory Rate (breaths/min): 16 Body Mass Index (BMI): 40.2 Blood Pressure (mmHg): 142/61 Reference Range: 80 - 120 mg / dl Electronic Signature(s) Signed: 02/01/2019 10:39:52 AM By: Lorine Bears RCP, RRT, CHT Entered By: Becky Sax, Amado Nash on 02/01/2019 17:53:01

## 2019-02-04 NOTE — Progress Notes (Signed)
Pain Management Virtual Encounter Note - Virtual Visit via Telephone Telehealth (real-time audio visits between healthcare provider and patient).   Patient's Phone No. & Preferred Pharmacy:  [phone] (home); [phone] (mobile); (Preferred) [phone] [email or web address]  Kara [name], [place] [address] Phone: [phone] Fax: [phone]    Pre-screening note:  Our staff contacted Kara [name] and offered her an "in person", "face-to-face" appointment versus a telephone encounter. She indicated preferring the telephone encounter, at this time.   Reason for Virtual Visit: COVID-19*  Social distancing based on CDC and AMA recommendations.   I contacted Kara [name] on 02/05/2019 via telephone.      I clearly identified myself as Gaspar Cola, MD. I verified that I was speaking with the correct person using two identifiers (Name: Kara [name], and date of birth: Oct 20, 1950).  Advanced Informed Consent I sought verbal advanced consent from Kara [name] for virtual visit interactions. I informed [name] of possible security and privacy concerns, risks, and limitations associated with providing "not-in-person" medical evaluation and management services. I also informed Kara [name] of the availability of "in-person" appointments. Finally, I informed her that there would be a charge for the virtual visit and that she could be  personally, fully or partially, financially responsible for it. Kara [name] expressed understanding and agreed to proceed.   Historic Elements   Kara [name] is a 68 y.o. year old, female patient evaluated today after her last encounter by our practice on 02/01/2019. 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 She also  has a past surgical history that includes Knee surgery; Carpal tunnel release (Bilateral); Colon resection sigmoid (N/A, 11/02/2016); Colostomy (N/A, 11/02/2016); Sigmoidoscopy (N/A, 11/13/2016); PULMONARY VENOGRAPHY (N/A, 11/19/2016); Colonoscopy with propofol (N/A, 02/03/2017); Colon surgery (11/02/2016); IVC FILTER INSERTION (Right, 10/2016); Colostomy reversal (N/A, 02/24/2017); Appendectomy (N/A, 02/24/2017); Lysis of adhesion (N/A, 02/24/2017); laparoscopy (N/A, 02/24/2017); Ileo loop diversion (N/A, 02/24/2017); Sigmoidoscopy (N/A, 05/19/2017); Ileostomy closure (N/A, 06/01/2017); IVC FILTER REMOVAL (N/A, 08/09/2017); Sacroplasty (N/A, 11/08/2017); Cataract extraction w/PHACO (Left, 11/21/2018); and Cataract extraction w/PHACO (Right, 12/12/2018). Kara [name] has a current medication list which includes the following prescription(s): aspirin ec, calcium polycarbophil, candesartan, cyclobenzaprine, gabapentin, glipizide, meclizine, multi-vitamins, ropinirole, warfarin, and warfarin. She  reports that she has never smoked. She has never used smokeless tobacco. She reports that she does not drink alcohol or use drugs. Kara [name] is allergic to adhesive [tape]; other; latex; and morphine and related.   HPI  Today, she is being contacted for a post-procedure assessment.  Post-Procedure Evaluation  Procedure: Palliative right lumbar facet & right SI block #7 under fluoroscopic guidance, no sedation Pre-procedure pain level:  9/10 Post-procedure: 5/10 Limited initial benefit, possibly due to rapid discharge after no sedation procedure, without enough time to allow full onset of block.  Sedation: None.  Effectiveness during initial hour after procedure(Ultra-Short Term Relief): 90 %   Local anesthetic used: Long-acting (4-6  hours) Effectiveness: Defined as any analgesic benefit obtained secondary to the administration of local anesthetics. This carries significant diagnostic value as to the etiological location, or anatomical origin, of the pain. Duration of benefit is expected to coincide with the duration of the local anesthetic used.  Effectiveness during initial 4-6 hours after procedure(Short-Term  Relief): 90 %   Long-term benefit: Defined as any relief past the pharmacologic duration of the local anesthetics.  Effectiveness past the initial 6 hours after procedure(Long-Term Relief): 80 %(for 2 weeks)   Current benefits: Defined as benefit that persist at this time.   Analgesia:  Back to baseline Function: Somewhat improved ROM: Somewhat improved  Pharmacotherapy Assessment  Analgesic: Hydrocodone/APAP 5/325, 1 tab PO QD PRN (5 mg/day of hydrocodone) (#60 was lasting her 4-5 mo. Last filled on 10/31/2018) MME: 5 mg/day.   Monitoring: Pharmacotherapy: No side-effects or adverse reactions reported. [place] PMP: PDMP reviewed during this encounter.       Compliance: No problems identified. Effectiveness: Clinically acceptable. Plan: Refer to "POC".  UDS: No results found for: SUMMARY Laboratory Chemistry Profile (12 mo)  Renal: 12/23/2018: BUN 12; Creatinine, Ser 0.81  Lab Results  Component Value Date   GFRAA >60 12/23/2018   GFRNONAA >60 12/23/2018   Hepatic: 12/23/2018: Albumin 3.5 Lab Results  Component Value Date   AST 30 12/23/2018   ALT 36 12/23/2018   Other: No results found for requested labs within last 8760 hours. Note: Above Lab results reviewed.  Imaging  Last 90 days:  Dg Tibia/fibula Left  Result Date: 12/23/2018 CLINICAL DATA:  Bruises bilateral lower legs post fall. EXAM: LEFT TIBIA AND FIBULA - 2 VIEW COMPARISON:  None. FINDINGS: There is no evidence of fracture or dislocation. Soft tissues are unremarkable. IMPRESSION: No acute fracture or dislocation. Electronically Signed   By:  Abelardo Diesel M.D.   On: 12/23/2018 13:51   Dg Tibia/fibula Right  Result Date: 12/23/2018 CLINICAL DATA:  Status post fall with bruises in the lower legs EXAM: RIGHT TIBIA AND FIBULA - 2 VIEW COMPARISON:  None. FINDINGS: There is no evidence of fracture or dislocation. Tibial spine osteophytosis is noted. Soft tissues are unremarkable. IMPRESSION: No acute fracture or dislocation. Electronically Signed   By: Abelardo Diesel M.D.   On: 12/23/2018 13:50   Dg [facility] C-arm 1-60 Min No Report  Result Date: 12/26/2018 Fluoro was used, but no Radiologist interpretation will be provided. Please refer to "NOTES" tab for provider progress note.   Assessment  The primary encounter diagnosis was Chronic pain syndrome. 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 were also pertinent to this visit.  Plan of Care  I am having Kara [name] maintain her aspirin EC, cyclobenzaprine, Multi-Vitamins, glipiZIDE, candesartan, gabapentin, meclizine, rOPINIRole, warfarin, warfarin, and Calcium Polycarbophil (FIBER-CAPS PO).  Pharmacotherapy (Medications Ordered): No orders of the defined types were placed in this encounter.  Orders:  No orders of the defined types were placed in this encounter.  Follow-up plan:   Return for scheduled appointment for repeat lumbar facet block.      Interventional management options:  Considering:   NOTE:COUMADINANTICOAGULATION(Stop:5days  Re-start: 2hrs) NO RFAuntil BMIis <35. Diagnostic L5-S1 LESI Diagnostic bilateral L3TFESI Diagnostic bilateral  L4TFESI Diagnostic bilateral L5 TFESI Possible righ SI joint RFA Possible bilateral lumbar facet RFA(NO RFAuntil BMIis <35.) Diagnostic right IA hip joint injection Diagnostic right femoral +ObturatorNB Possible right femoral + obturator nerve RFA   Palliative PRN treatment(s):   Palliative/Therapeutic left L4-5 LESI #2 Diagnostic left lumbar facet block#4 Palliativeright  lumbar facet block(s) #8 Palliative right SI joint block #4 Palliative/Therapeutic (Midline) caudalESI#4    Recent Visits Date Type Provider Dept  12/26/18 Procedure visit Milinda Pointer, MD [name]  11/13/18 Office Visit Milinda Pointer, MD [name]  Showing recent visits within past 90 days and meeting all other requirements   Today's Visits Date Type Provider Dept  02/05/19 Office Visit Milinda Pointer, MD [name]  Showing today's visits and meeting all other requirements   Future Appointments Date Type Provider Dept  02/08/19 Appointment Milinda Pointer, MD [name]  Showing future appointments within next 90 days and meeting all other requirements   I discussed the assessment and treatment plan with the patient. The patient was provided an opportunity to ask questions and all were answered. The patient agreed with the plan and demonstrated an understanding of the instructions.  Patient advised to call back or seek an in-person evaluation if the symptoms or condition worsens.  Total duration of non-face-to-face encounter: 12 minutes.  Note by: Gaspar Cola, MD Date: 02/05/2019; Time: 3:49 PM  Note: This dictation was prepared with Dragon dictation. Any transcriptional errors that may result from this process are unintentional.  Disclaimer:  * Given the special circumstances of the COVID-19 pandemic, the federal government has announced that the Office for Civil Rights (OCR) will exercise its enforcement  discretion and will not impose penalties on physicians using telehealth in the event of noncompliance with regulatory requirements under the [place] (HIPAA) in connection with the good faith provision of telehealth during the VZDGL-87 national public health emergency. ([place])

## 2019-02-05 ENCOUNTER — Ambulatory Visit: Payer: PPO | Attending: Pain Medicine | Admitting: Pain Medicine

## 2019-02-05 ENCOUNTER — Other Ambulatory Visit: Payer: Self-pay

## 2019-02-05 DIAGNOSIS — M4310 Spondylolisthesis, site unspecified: Secondary | ICD-10-CM

## 2019-02-05 DIAGNOSIS — M48061 Spinal stenosis, lumbar region without neurogenic claudication: Secondary | ICD-10-CM

## 2019-02-05 DIAGNOSIS — M545 Low back pain, unspecified: Secondary | ICD-10-CM

## 2019-02-05 DIAGNOSIS — M533 Sacrococcygeal disorders, not elsewhere classified: Secondary | ICD-10-CM

## 2019-02-05 DIAGNOSIS — M79605 Pain in left leg: Secondary | ICD-10-CM

## 2019-02-05 DIAGNOSIS — M4807 Spinal stenosis, lumbosacral region: Secondary | ICD-10-CM

## 2019-02-05 DIAGNOSIS — M79604 Pain in right leg: Secondary | ICD-10-CM | POA: Diagnosis not present

## 2019-02-05 DIAGNOSIS — G894 Chronic pain syndrome: Secondary | ICD-10-CM

## 2019-02-05 DIAGNOSIS — M47816 Spondylosis without myelopathy or radiculopathy, lumbar region: Secondary | ICD-10-CM

## 2019-02-05 DIAGNOSIS — M4300 Spondylolysis, site unspecified: Secondary | ICD-10-CM | POA: Diagnosis not present

## 2019-02-05 DIAGNOSIS — G8929 Other chronic pain: Secondary | ICD-10-CM

## 2019-02-05 NOTE — Patient Instructions (Signed)
____________________________________________________________________________________________  Preparing for your procedure (without sedation)  Procedure appointments are limited to planned procedures: . No Prescription Refills. . No disability issues will be discussed. . No medication changes will be discussed.  Instructions: . Oral Intake: Do not eat or drink anything for at least 3 hours prior to your procedure. . Transportation: Unless otherwise stated by your physician, you may drive yourself after the procedure. . Blood Pressure Medicine: Take your blood pressure medicine with a sip of water the morning of the procedure. . Blood thinners: Notify our staff if you are taking any blood thinners. Depending on which one you take, there will be specific instructions on how and when to stop it. . Diabetics on insulin: Notify the staff so that you can be scheduled 1st case in the morning. If your diabetes requires high dose insulin, take only  of your normal insulin dose the morning of the procedure and notify the staff that you have done so. . Preventing infections: Shower with an antibacterial soap the morning of your procedure.  . Build-up your immune system: Take 1000 mg of Vitamin C with every meal (3 times a day) the day prior to your procedure. . Antibiotics: Inform the staff if you have a condition or reason that requires you to take antibiotics before dental procedures. . Pregnancy: If you are pregnant, call and cancel the procedure. . Sickness: If you have a cold, fever, or any active infections, call and cancel the procedure. . Arrival: You must be in the facility at least 30 minutes prior to your scheduled procedure. . Children: Do not bring any children with you. . Dress appropriately: Bring dark clothing that you would not mind if they get stained. . Valuables: Do not bring any jewelry or valuables.  Reasons to call and reschedule or cancel your procedure: (Following these  recommendations will minimize the risk of a serious complication.) . Surgeries: Avoid having procedures within 2 weeks of any surgery. (Avoid for 2 weeks before or after any surgery). . Flu Shots: Avoid having procedures within 2 weeks of a flu shots or . (Avoid for 2 weeks before or after immunizations). . Barium: Avoid having a procedure within 7-10 days after having had a radiological study involving the use of radiological contrast. (Myelograms, Barium swallow or enema study). . Heart attacks: Avoid any elective procedures or surgeries for the initial 6 months after a "Myocardial Infarction" (Heart Attack). . Blood thinners: It is imperative that you stop these medications before procedures. Let us know if you if you take any blood thinner.  . Infection: Avoid procedures during or within two weeks of an infection (including chest colds or gastrointestinal problems). Symptoms associated with infections include: Localized redness, fever, chills, night sweats or profuse sweating, burning sensation when voiding, cough, congestion, stuffiness, runny nose, sore throat, diarrhea, nausea, vomiting, cold or Flu symptoms, recent or current infections. It is specially important if the infection is over the area that we intend to treat. . Heart and lung problems: Symptoms that may suggest an active cardiopulmonary problem include: cough, chest pain, breathing difficulties or shortness of breath, dizziness, ankle swelling, uncontrolled high or unusually low blood pressure, and/or palpitations. If you are experiencing any of these symptoms, cancel your procedure and contact your primary care physician for an evaluation.  Remember:  Regular Business hours are:  Monday to Thursday 8:00 AM to 4:00 PM  Provider's Schedule: Breken Nazari, MD:  Procedure days: Tuesday and Thursday 7:30 AM to 4:00 PM  Bilal   Lateef, MD:  Procedure days: Monday and Wednesday 7:30 AM to 4:00  PM ____________________________________________________________________________________________    

## 2019-02-08 ENCOUNTER — Ambulatory Visit (HOSPITAL_BASED_OUTPATIENT_CLINIC_OR_DEPARTMENT_OTHER): Payer: PPO | Admitting: Pain Medicine

## 2019-02-08 ENCOUNTER — Ambulatory Visit: Payer: PPO | Admitting: Physician Assistant

## 2019-02-08 ENCOUNTER — Other Ambulatory Visit: Payer: Self-pay

## 2019-02-08 ENCOUNTER — Encounter: Payer: Self-pay | Admitting: Pain Medicine

## 2019-02-08 ENCOUNTER — Ambulatory Visit
Admission: RE | Admit: 2019-02-08 | Discharge: 2019-02-08 | Disposition: A | Discharge status: Home / Self Care | Payer: PPO | Source: Ambulatory Visit | Attending: Pain Medicine | Admitting: Pain Medicine

## 2019-02-08 VITALS — BP 131/71 | HR 80 | Temp 97.8°F | Resp 15 | Ht 63.0 in | Wt 235.0 lb

## 2019-02-08 DIAGNOSIS — M4306 Spondylolysis, lumbar region: Secondary | ICD-10-CM | POA: Insufficient documentation

## 2019-02-08 DIAGNOSIS — M47817 Spondylosis without myelopathy or radiculopathy, lumbosacral region: Secondary | ICD-10-CM | POA: Insufficient documentation

## 2019-02-08 DIAGNOSIS — M545 Low back pain: Secondary | ICD-10-CM | POA: Diagnosis not present

## 2019-02-08 DIAGNOSIS — G8929 Other chronic pain: Secondary | ICD-10-CM | POA: Insufficient documentation

## 2019-02-08 DIAGNOSIS — M4310 Spondylolisthesis, site unspecified: Secondary | ICD-10-CM

## 2019-02-08 DIAGNOSIS — M4300 Spondylolysis, site unspecified: Secondary | ICD-10-CM | POA: Diagnosis not present

## 2019-02-08 DIAGNOSIS — M47816 Spondylosis without myelopathy or radiculopathy, lumbar region: Secondary | ICD-10-CM

## 2019-02-08 DIAGNOSIS — M5137 Other intervertebral disc degeneration, lumbosacral region: Secondary | ICD-10-CM

## 2019-02-08 DIAGNOSIS — G894 Chronic pain syndrome: Secondary | ICD-10-CM

## 2019-02-08 MED ORDER — TRIAMCINOLONE ACETONIDE 40 MG/ML IJ SUSP
INTRAMUSCULAR | Status: AC
  Filled 2019-02-08: qty 2

## 2019-02-08 MED ORDER — TRIAMCINOLONE ACETONIDE 40 MG/ML IJ SUSP
80.0000 mg | Freq: Once | INTRAMUSCULAR | Status: AC
  Administered 2019-02-08: 80 mg

## 2019-02-08 MED ORDER — ROPIVACAINE HCL 2 MG/ML IJ SOLN
INTRAMUSCULAR | Status: AC
  Filled 2019-02-08: qty 20

## 2019-02-08 MED ORDER — HYDROCODONE-ACETAMINOPHEN 5-325 MG PO TABS: 1.0000 | ORAL_TABLET | Freq: Three times a day (TID) | ORAL | 0 refills | Status: DC | PRN

## 2019-02-08 MED ORDER — LIDOCAINE HCL 2 % IJ SOLN
20.0000 mL | Freq: Once | INTRAMUSCULAR | Status: AC
  Administered 2019-02-08: 400 mg

## 2019-02-08 MED ORDER — ROPIVACAINE HCL 2 MG/ML IJ SOLN
18.0000 mL | Freq: Once | INTRAMUSCULAR | Status: AC
  Administered 2019-02-08: 08:00:00 18 mL via PERINEURAL

## 2019-02-08 MED ORDER — LIDOCAINE HCL 2 % IJ SOLN
INTRAMUSCULAR | Status: AC
  Filled 2019-02-08: qty 20

## 2019-02-08 NOTE — Patient Instructions (Signed)

## 2019-02-08 NOTE — Progress Notes (Signed)
Patient's Name: Kara [name]  MRN: [id number]  Referring Provider: Tracie Harrier, MD  DOB: 1950/06/08  PCP: Tracie Harrier, MD  DOS: 02/08/2019  Note by: Gaspar Cola, MD  Service setting: Ambulatory outpatient  Specialty: Interventional Pain Management  Patient type: Established  Location: [name] (AMB) Pain Management Facility  Visit type: Interventional Procedure   Primary Reason for Visit: Interventional Pain Management Treatment. CC: Back Pain (lower, bilateral)  Procedure:          Anesthesia, Analgesia, Anxiolysis:  Type: Lumbar Facet, Medial Branch Block(s)          Primary Purpose: Diagnostic Region: Posterolateral Lumbosacral Spine Level: L2, L3, L4, L5, & S1 Medial Branch Level(s). Injecting these levels blocks the L3-4, L4-5, and L5-S1 lumbar facet joints. Laterality: Bilateral  Type: Local Anesthesia Indication(s): Analgesia         Route: Infiltration ([place]/IM) IV Access: Declined Sedation: Declined  Local Anesthetic: Lidocaine 1-2%  Position: Prone   Indications: 1. Lumbar facet syndrome (Bilateral)   2. Spondylosis without myelopathy or radiculopathy, lumbosacral region   3. DDD (degenerative disc disease), lumbosacral   4. Chronic low back pain (Primary Area of Pain) (Bilateral)   5. 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 (year old), female patient, seen today for interventional treatment. She  has a past surgical history that includes Knee surgery; Carpal tunnel release (Bilateral); Colon resection sigmoid (N/A, 11/02/2016); Colostomy (N/A, 11/02/2016); Sigmoidoscopy (N/A, 11/13/2016); PULMONARY VENOGRAPHY (N/A, 11/19/2016); Colonoscopy with propofol (N/A, 02/03/2017); Colon surgery (11/02/2016); IVC FILTER INSERTION (Right, 10/2016); Colostomy reversal (N/A,  02/24/2017); Appendectomy (N/A, 02/24/2017); Lysis of adhesion (N/A, 02/24/2017); laparoscopy (N/A, 02/24/2017); [place] loop diversion (N/A, 02/24/2017); Sigmoidoscopy (N/A, 05/19/2017); Ileostomy closure (N/A, 06/01/2017); IVC FILTER REMOVAL (N/A, 08/09/2017); Sacroplasty (N/A, 11/08/2017); Cataract extraction w/PHACO (Left, 11/21/2018); and Cataract extraction w/PHACO (Right, 12/12/2018). Kara [name] has a current medication list which includes the following prescription(s): aspirin ec, calcium polycarbophil, candesartan, cyclobenzaprine, gabapentin, glipizide, meclizine, multi-vitamins, ropinirole, warfarin, warfarin, and hydrocodone-acetaminophen. Her primarily concern today is the Back Pain (lower, bilateral)  Initial Vital Signs:  Pulse/HCG Rate: 81  Temp: 97.8 F (36.6 C) Resp: 18 BP: 138/60 SpO2: 96 %  BMI: Estimated body mass index is 41.63 kg/m as calculated from the following:   Height as of this encounter: 5\' 3"  (1.6 m).   Weight as of this encounter: 235 lb (106.6 kg).  Risk Assessment: Allergies: Reviewed. She is allergic to adhesive [tape]; other; latex; and morphine and related.  Allergy Precautions: None required Coagulopathies: Reviewed. None identified.  Blood-thinner therapy: None at this time Active Infection(s): Reviewed. None identified. Kara [name] is afebrile  Site Confirmation: Kara [name] was asked to confirm the procedure and laterality before marking the site Procedure checklist: Completed Consent: Before the procedure and under the influence of no sedative(s), amnesic(s), or anxiolytics, the patient was informed of the treatment options, risks and possible complications. To fulfill our ethical and legal obligations, as recommended by the American Medical Association's Code of Ethics, I have informed the patient of my clinical impression; the nature and purpose of the treatment or procedure; the risks, benefits, and possible complications of the  intervention; the alternatives, including doing nothing; the risk(s) and benefit(s) of the alternative treatment(s) or procedure(s); and the risk(s) and benefit(s) of doing nothing. The patient was provided information about  the general risks and possible complications associated with the procedure. These may include, but are not limited to: failure to achieve desired goals, infection, bleeding, organ or nerve damage, allergic reactions, paralysis, and death. In addition, the patient was informed of those risks and complications associated to Spine-related procedures, such as failure to decrease pain; infection (i.e.: Meningitis, epidural or intraspinal abscess); bleeding (i.e.: epidural hematoma, subarachnoid hemorrhage, or any other type of intraspinal or peri-dural bleeding); organ or nerve damage (i.e.: Any type of peripheral nerve, nerve root, or spinal cord injury) with subsequent damage to sensory, motor, and/or autonomic systems, resulting in permanent pain, numbness, and/or weakness of one or several areas of the body; allergic reactions; (i.e.: anaphylactic reaction); and/or death. Furthermore, the patient was informed of those risks and complications associated with the medications. These include, but are not limited to: allergic reactions (i.e.: anaphylactic or anaphylactoid reaction(s)); adrenal axis suppression; blood sugar elevation that in diabetics may result in ketoacidosis or comma; water retention that in patients with history of congestive heart failure may result in shortness of breath, pulmonary edema, and decompensation with resultant heart failure; weight gain; swelling or edema; medication-induced neural toxicity; particulate matter embolism and blood vessel occlusion with resultant organ, and/or nervous system infarction; and/or aseptic necrosis of one or more joints. Finally, the patient was informed that Medicine is not an exact science; therefore, there is also the possibility of  unforeseen or unpredictable risks and/or possible complications that may result in a catastrophic outcome. The patient indicated having understood very clearly. We have given the patient no guarantees and we have made no promises. Enough time was given to the patient to ask questions, all of which were answered to the patient's satisfaction. Kara [name] has indicated that she wanted to continue with the procedure. Attestation: I, the ordering provider, attest that I have discussed with the patient the benefits, risks, side-effects, alternatives, likelihood of achieving goals, and potential problems during recovery for the procedure that I have provided informed consent. Date   Time:   Pre-Procedure Preparation:  Monitoring: As per clinic protocol. Respiration, ETCO2, SpO2, BP, heart rate and rhythm monitor placed and checked for adequate function Safety Precautions: Patient was assessed for positional comfort and pressure points before starting the procedure. Time-out: I initiated and conducted the "Time-out" before starting the procedure, as per protocol. The patient was asked to participate by confirming the accuracy of the "Time Out" information. Verification of the correct person, site, and procedure were performed and confirmed by me, the nursing staff, and the patient. "Time-out" conducted as per Joint Commission's Universal Protocol (UP.01.01.01). Time: 3810  Description of Procedure:          Laterality: Bilateral. The procedure was performed in identical fashion on both sides. Levels:  L2, L3, L4, L5, & S1 Medial Branch Level(s) Area Prepped: Posterior Lumbosacral Region Prepping solution: DuraPrep (Iodine Povacrylex [0.7% available iodine] and Isopropyl Alcohol, 74% w/w) Safety Precautions: Aspiration looking for blood return was conducted prior to all injections. At no point did we inject any substances, as a needle was being advanced. Before injecting, the patient was told to  immediately notify me if she was experiencing any new onset of "ringing in the ears, or metallic taste in the mouth". No attempts were made at seeking any paresthesias. Safe injection practices and needle disposal techniques used. Medications properly checked for expiration dates. SDV (single dose vial) medications used. After the completion of the procedure, all disposable equipment used was discarded in the proper  designated Insurance risk surveyor. Local Anesthesia: Protocol guidelines were followed. The patient was positioned over the fluoroscopy table. The area was prepped in the usual manner. The time-out was completed. The target area was identified using fluoroscopy. A 12-in long, straight, sterile hemostat was used with fluoroscopic guidance to locate the targets for each level blocked. Once located, the skin was marked with an approved surgical skin marker. Once all sites were marked, the skin (epidermis, dermis, and hypodermis), as well as deeper tissues (fat, connective tissue and muscle) were infiltrated with a small amount of a short-acting local anesthetic, loaded on a 10cc syringe with a 25G, 1.5-in  Needle. An appropriate amount of time was allowed for local anesthetics to take effect before proceeding to the next step. Local Anesthetic: Lidocaine 2.0% The unused portion of the local anesthetic was discarded in the proper designated containers. Technical explanation of process:  L2 Medial Branch Nerve Block (MBB): The target area for the L2 medial branch is at the junction of the postero-lateral aspect of the superior articular process and the superior, posterior, and medial edge of the transverse process of L3. Under fluoroscopic guidance, a Quincke needle was inserted until contact was made with os over the superior postero-lateral aspect of the pedicular shadow (target area). After negative aspiration for blood, 0.5 mL of the nerve block solution was injected without difficulty or  complication. The needle was removed intact. L3 Medial Branch Nerve Block (MBB): The target area for the L3 medial branch is at the junction of the postero-lateral aspect of the superior articular process and the superior, posterior, and medial edge of the transverse process of L4. Under fluoroscopic guidance, a Quincke needle was inserted until contact was made with os over the superior postero-lateral aspect of the pedicular shadow (target area). After negative aspiration for blood, 0.5 mL of the nerve block solution was injected without difficulty or complication. The needle was removed intact. L4 Medial Branch Nerve Block (MBB): The target area for the L4 medial branch is at the junction of the postero-lateral aspect of the superior articular process and the superior, posterior, and medial edge of the transverse process of L5. Under fluoroscopic guidance, a Quincke needle was inserted until contact was made with os over the superior postero-lateral aspect of the pedicular shadow (target area). After negative aspiration for blood, 0.5 mL of the nerve block solution was injected without difficulty or complication. The needle was removed intact. L5 Medial Branch Nerve Block (MBB): The target area for the L5 medial branch is at the junction of the postero-lateral aspect of the superior articular process and the superior, posterior, and medial edge of the sacral ala. Under fluoroscopic guidance, a Quincke needle was inserted until contact was made with os over the superior postero-lateral aspect of the pedicular shadow (target area). After negative aspiration for blood, 0.5 mL of the nerve block solution was injected without difficulty or complication. The needle was removed intact. S1 Medial Branch Nerve Block (MBB): The target area for the S1 medial branch is at the posterior and inferior 6 o'clock position of the L5-S1 facet joint. Under fluoroscopic guidance, the Quincke needle inserted for the L5 MBB was  redirected until contact was made with os over the inferior and postero aspect of the sacrum, at the 6 o' clock position under the L5-S1 facet joint (Target area). After negative aspiration for blood, 0.5 mL of the nerve block solution was injected without difficulty or complication. The needle was removed intact.  Nerve block  solution: 0.2% PF-Ropivacaine + Triamcinolone (40 mg/mL) diluted to a final concentration of 4 mg of Triamcinolone/mL of Ropivacaine The unused portion of the solution was discarded in the proper designated containers. Procedural Needles: 22-gauge, 3.5-inch, Quincke needles used for all levels.  Once the entire procedure was completed, the treated area was cleaned, making sure to leave some of the prepping solution back to take advantage of its long term bactericidal properties.   Illustration of the posterior view of the lumbar spine and the posterior neural structures. Laminae of L2 through S1 are labeled. DPRL5, dorsal primary ramus of L5; DPRS1, dorsal primary ramus of S1; DPR3, dorsal primary ramus of L3; FJ, facet (zygapophyseal) joint L3-L4; I, inferior articular process of L4; LB1, lateral branch of dorsal primary ramus of L1; IAB, inferior articular branches from L3 medial branch (supplies L4-L5 facet joint); IBP, intermediate branch plexus; MB3, medial branch of dorsal primary ramus of L3; NR3, third lumbar nerve root; S, superior articular process of L5; SAB, superior articular branches from L4 (supplies L4-5 facet joint also); TP3, transverse process of L3.  Vitals:   02/08/19 0840 02/08/19 0850 02/08/19 0900 02/08/19 0907  BP: 123/86 134/74 125/68 131/71  Pulse: 81 83 81 80  Resp: 19 12 16 15   Temp:      TempSrc:      SpO2: 94% 95% 96% 97%  Weight:      Height:         Start Time: 0853 hrs. End Time: 0905 hrs.  Imaging Guidance (Spinal):          Type of Imaging Technique: Fluoroscopy Guidance (Spinal) Indication(s): Assistance in needle guidance and  placement for procedures requiring needle placement in or near specific anatomical locations not easily accessible without such assistance. Exposure Time: Please see nurses notes. Contrast: None used. Fluoroscopic Guidance: I was personally present during the use of fluoroscopy. "Tunnel Vision Technique" used to obtain the best possible view of the target area. Parallax error corrected before commencing the procedure. "Direction-depth-direction" technique used to introduce the needle under continuous pulsed fluoroscopy. Once target was reached, antero-posterior, oblique, and lateral fluoroscopic projection used confirm needle placement in all planes. Images permanently stored in EMR. Interpretation: No contrast injected. I personally interpreted the imaging intraoperatively. Adequate needle placement confirmed in multiple planes. Permanent images saved into the patient's record.  Antibiotic Prophylaxis:   Anti-infectives (From admission, onward)   None     Indication(s): None identified  Post-operative Assessment:  Post-procedure Vital Signs:  Pulse/HCG Rate: 80  Temp: 97.8 F (36.6 C) Resp: 15 BP: 131/71 SpO2: 97 %  EBL: None  Complications: No immediate post-treatment complications observed by team, or reported by patient.  Note: The patient tolerated the entire procedure well. A repeat set of vitals were taken after the procedure and the patient was kept under observation following institutional policy, for this type of procedure. Post-procedural neurological assessment was performed, showing return to baseline, prior to discharge. The patient was provided with post-procedure discharge instructions, including a section on how to identify potential problems. Should any problems arise concerning this procedure, the patient was given instructions to immediately contact us, at any time, without hesitation. In any case, we plan to contact the patient by telephone for a follow-up status  report regarding this interventional procedure.  Comments:  No additional relevant information.  Plan of Care  Orders:  Orders Placed This Encounter  Procedures   LUMBAR FACET(MEDIAL BRANCH NERVE BLOCK) MBNB    Scheduling Instructions:  Side: Bilateral     Level: L3-4, L4-5, & L5-S1 Facets (L2, L3, L4, L5, & S1 Medial Branch Nerves)     Sedation: No Sedation.     Timeframe: Today    Order Specific Question:   Where will this procedure be performed?    Answer:   [name]   DG PAIN [facility] C-ARM 1-60 MIN NO REPORT    Intraoperative interpretation by procedural physician at [place].    Standing Status:   Standing    Number of Occurrences:   1    Order Specific Question:   Reason for exam:    Answer:   Assistance in needle guidance and placement for procedures requiring needle placement in or near specific anatomical locations not easily accessible without such assistance.   Informed Consent Details: Physician/Practitioner Attestation; Transcribe to consent form and obtain patient signature    Surgeon: Adison Jerger A. Dossie Arbour, MD    Scheduling Instructions:     Procedure: Diagnostic, Bilateral, Lumbar facet block under fluoroscopic guidance. (See notes for level(s).)     Indications: Chronic low back pain secondary to lumbar facet syndrome and primary osteoarthritis of the lumbar spine   Chronic Opioid Analgesic:  Hydrocodone/APAP 5/325, 1 tab PO QD PRN (5 mg/day of hydrocodone) (#60 was lasting her 4-5 mo. Last filled on 10/31/2018) MME: 5 mg/day.   Medications ordered for procedure: Meds ordered this encounter  Medications   lidocaine (XYLOCAINE) 2 % (with pres) injection 400 mg   ropivacaine (PF) 2 mg/mL (0.2%) (NAROPIN) injection 18 mL   triamcinolone acetonide (KENALOG-40) injection 80 mg   HYDROcodone-acetaminophen (NORCO/VICODIN) 5-325 MG tablet    Sig: Take 1 tablet by mouth every 8 (eight) hours as needed for severe pain. Must last 30 days.      Dispense:  30 tablet    Refill:  0    Chronic Pain: STOP Act (Not applicable) Fill 1 day early if closed on refill date. Do not fill until: 02/08/2019. To last until: 03/10/2019. Avoid benzodiazepines within 8 hours of opioids   Medications administered: We administered lidocaine, ropivacaine (PF) 2 mg/mL (0.2%), and triamcinolone acetonide.  See the medical record for exact dosing, route, and time of administration.  Follow-up plan:   Return in about 2 weeks (around 02/22/2019) for (VV), (PP).       Interventional management options:  Considering:   NOTE:COUMADINANTICOAGULATION(Stop:5days   Re-start: 2hrs) NO RFAuntil BMIis <35. Diagnostic L5-S1 LESI Diagnostic bilateral L3TFESI Diagnostic bilateral L4TFESI Diagnostic bilateral L5 TFESI Possible righ SI joint RFA Possible bilateral lumbar facet RFA(NO RFAuntil BMIis <35.) Diagnostic right IA hip joint injection Diagnostic right femoral +ObturatorNB Possible right femoral + obturator nerve RFA   Palliative PRN treatment(s):   Palliative/Therapeutic left L4-5 LESI #2 Diagnostic left lumbar facet block#4 Palliativeright lumbar facet block(s) #8 Palliative right SI joint block #4 Palliative/Therapeutic (Midline) caudalESI#4     Recent Visits Date Type Provider Dept  02/05/19 Office Visit Milinda Pointer, MD [name]  12/26/18 Procedure visit Milinda Pointer, MD [name]  11/13/18 Office Visit Milinda Pointer, MD [name]  Showing recent visits within past 90 days and meeting all other requirements   Today's Visits Date Type Provider Dept  02/08/19 Procedure visit Milinda Pointer, MD [name]  Showing today's visits and meeting all other requirements   Future Appointments Date Type Provider Dept  02/28/19 Appointment Milinda Pointer, MD [name]  Showing future appointments within next 90 days and meeting all  other requirements   Disposition: Discharge home  Discharge Date & Time: 02/08/2019; 0909 hrs.   Primary Care Physician: Tracie Harrier, MD Location: [facility] Note by: Gaspar Cola, MD Date: 02/08/2019; Time: 9:20 AM  Disclaimer:  Medicine is not an Chief Strategy Officer. The only guarantee in medicine is that nothing is guaranteed. It is important to note that the decision to proceed with this intervention was based on the information collected from the patient. The Data and conclusions were drawn from the patient's questionnaire, the interview, and the physical examination. Because the information was provided in large part by the patient, it cannot be guaranteed that it has not been purposely or unconsciously manipulated. Every effort has been made to obtain as much relevant data as possible for this evaluation. It is important to note that the conclusions that lead to this procedure are derived in large part from the available data. Always take into account that the treatment will also be dependent on availability of resources and existing treatment guidelines, considered by other Pain Management Practitioners as being common knowledge and practice, at the time of the intervention. For Medico-Legal purposes, it is also important to point out that variation in procedural techniques and pharmacological choices are the acceptable norm. The indications, contraindications, technique, and results of the above procedure should only be interpreted and judged by a Board-Certified Interventional Pain Specialist with extensive familiarity and expertise in the same exact procedure and technique.

## 2019-02-09 ENCOUNTER — Encounter: Payer: PPO | Admitting: Physician Assistant

## 2019-02-09 ENCOUNTER — Telehealth: Payer: Self-pay

## 2019-02-09 ENCOUNTER — Other Ambulatory Visit: Payer: Self-pay

## 2019-02-09 DIAGNOSIS — E11622 Type 2 diabetes mellitus with other skin ulcer: Secondary | ICD-10-CM | POA: Diagnosis not present

## 2019-02-09 DIAGNOSIS — L97812 Non-pressure chronic ulcer of other part of right lower leg with fat layer exposed: Secondary | ICD-10-CM | POA: Diagnosis not present

## 2019-02-09 NOTE — Progress Notes (Addendum)
Kara [name] ([id number]) Visit Report for 02/09/2019 Chief Complaint Document Details Patient Name: Kara [name], [name] Date of Service: 02/09/2019 8:00 AM Medical Record Number: [id number] Patient Account Number: [id number] Date of Birth/Sex: 04/06/1951 (68 y.o. F) Treating RN: Montey Hora Primary Care Provider: Tracie Harrier Other Clinician: Referring Provider: Tracie Harrier Treating Provider/Extender: Melburn Hake, HOYT Weeks in Treatment: 5 Information Obtained from: Patient Chief Complaint Left Leg ulcer x 2 weeks Electronic Signature(s) Signed: 02/09/2019 9:11:27 AM By: Worthy Keeler PA-C Entered By: Worthy Keeler on 02/09/2019 08:19:57 JACKSON-Boxx, Aashka K. ([id number]) -------------------------------------------------------------------------------- Debridement Details Patient Name: [name], [name] Date of Service: 02/09/2019 8:00 AM Medical Record Number: [id number] Patient Account Number: [id number] Date of Birth/Sex: 04-13-1951 (68 y.o. F) Treating RN: Montey Hora Primary Care Provider: Tracie Harrier Other Clinician: Referring Provider: Tracie Harrier Treating Provider/Extender: Melburn Hake, HOYT Weeks in Treatment: 5 Debridement Performed for Wound #2 Right,Lateral Lower Leg Assessment: Performed By: Physician STONE III, HOYT E., PA-C Debridement Type: Debridement Severity of Tissue Pre Fat layer exposed Debridement: Level of Consciousness (Pre- Awake and Alert procedure): Pre-procedure Verification/Time Yes - 08:32 Out Taken: Start Time: 08:32 Pain Control: Lidocaine 4% Topical Solution Total Area Debrided (L x W): 0.9 (cm) x 1.5 (cm) = 1.35 (cm) Tissue and other material Viable, Non-Viable, Slough, Subcutaneous, Slough debrided: Level: Skin/Subcutaneous Tissue Debridement Description: Excisional Instrument: Curette Bleeding: Minimum Hemostasis Achieved: Pressure End Time: 08:35 Procedural Pain: 0 Post  Procedural Pain: 0 Response to Treatment: Procedure was tolerated well Level of Consciousness Awake and Alert (Post-procedure): Post Debridement Measurements of Total Wound Length: (cm) 0.9 Width: (cm) 1.5 Depth: (cm) 0.2 Volume: (cm) 0.212 Character of Wound/Ulcer Post Debridement: Improved Severity of Tissue Post Debridement: Fat layer exposed Post Procedure Diagnosis Same as Pre-procedure Electronic Signature(s) Signed: 02/09/2019 9:11:27 AM By: Worthy Keeler PA-C Signed: 02/09/2019 4:27:31 PM By: Montey Hora Entered By: Montey Hora on 02/09/2019 08:35:33 JACKSON-Hogue, Donita K. ([id number]) -------------------------------------------------------------------------------- HPI Details Patient Name: [name], [name] Date of Service: 02/09/2019 8:00 AM Medical Record Number: [id number] Patient Account Number: [id number] Date of Birth/Sex: 11-27-1950 (68 y.o. F) Treating RN: Montey Hora Primary Care Provider: Tracie Harrier Other Clinician: Referring Provider: Tracie Harrier Treating Provider/Extender: Melburn Hake, HOYT Weeks in Treatment: 5 History of Present Illness HPI Description: Pleasant 68 year old Caucasian female who fell down a few steps in her home 2 weeks ago and developed bruises on both legs on the shins, on the left shin area she did have an open area that scabbed, patient did ED on 8/1 at which point she had dressing applied to the left shin wound, she had a right shin hematoma that was lanced with oozing of blood after. Since that time patient has noticed that the left calf leg wound developed a black covering on top and had started to hurt, she has been dressing this daily with an antibiotic ointment and gauze wrap. 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 Since a week now she has been on Keflex 500 twice daily prescribed by her PCP Patient's history significant for type 2  diabetes, remote history of DVT for which she is on Coumadin, Also remote history of PE for which she has been on Coumadin indefinitely, essential hypertension, history of colon cancer, history of CHF that is compensated, history of back pain ABIs today in the clinic 1.04 on the right 1.07 on the left 01/10/2019; patient admitted to the clinic  last week. She had a traumatic wound on the left lower extremity. We have been using silver alginate because of drainage. She has been in compression 8/26; this was initially a traumatic wound on the left lower extremity. It has some depth superiorly appears to have come in this week. We have been using silver alginate because of drainage 01/24/19 once again the wrap seems to have come down and she took it off and put Vaseline on the wound on Sunday. She did not call the clinic. About 75% of this is fully granulated 25% is not and that is superiorly. We have been using silver alginate but all changed to silver collagen today. 02/01/19 on evaluation today patient appears to be doing okay in regard to her left anterior lower extremity ulcer. She also has a right lower leg ulcer which really has not been monitored as much at this point due to the fact that it appeared to be doing quite well according to what that [facility] felt. Nonetheless there appears to be some fluid that is leaking from the right side as well upon evaluation today. I think this may need to be addressed at this point unfortunately. I don't think it's just gonna heal on its own at least not very effectively. No fevers, chills, nausea, or vomiting noted at this time. 02/09/2019 today patient appears to be doing a little better in regard to her left lower extremity. On the right lower extremity she actually does have some necrotic tissue noted at this point. Both areas do seem to be draining quite a bit at this time. There is no sign of active infection at this point. Electronic Signature(s) Signed:  02/09/2019 9:09:01 AM By: Worthy Keeler PA-C Entered By: Worthy Keeler on 02/09/2019 09:09:00 JACKSON-Merriott, Meryem K. ([id number]) -------------------------------------------------------------------------------- Physical Exam Details Patient Name: JACKSON-Dysart, Joshalyn K. Date of Service: 02/09/2019 8:00 AM Medical Record Number: [id number] Patient Account Number: [id number] Date of Birth/Sex: [birth year] (68 y.o. F) Treating RN: Montey Hora Primary Care Provider: Tracie Harrier Other Clinician: Referring Provider: Tracie Harrier Treating Provider/Extender: STONE III, HOYT Weeks in Treatment: 5 Constitutional Well-nourished and well-hydrated in no acute distress. Respiratory normal breathing without difficulty. Psychiatric this patient is able to make decisions and demonstrates good insight into disease process. Alert and Oriented x 3. pleasant and cooperative. Notes Patient's wound bed currently on the left did not require any sharp debridement and overall seems to be doing well is really more the periwound that I think we need to address some of the issues with currently. I believe a mixture of possibly triamcinolone, zinc, and nystatin may be a good idea she is to try to help the skin to improve somewhat in this region. In regard to the right lower extremity I think at this time the biggest issue I see is that we are going to need to debride away some of the necrotic tissue from the surface of the wound. Once I did perform this debridement today she actually seem to be doing a little bit better the surface of the wound is still not excellent here but again that may take some time. I do believe collagen will be appropriate however. Electronic Signature(s) Signed: 02/09/2019 9:09:54 AM By: Worthy Keeler PA-C Entered By: Worthy Keeler on 02/09/2019 09:09:53 JACKSON-Hawbaker, Vylet K.  ([id number]) -------------------------------------------------------------------------------- Physician Orders Details Patient Name: JACKSON-Wamboldt, Abigal K. Date of Service: 02/09/2019 8:00 AM Medical Record Number: [id number] Patient Account Number: [id number] Date of Birth/Sex: 18-Nov-1950 (68 y.o. 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 Wound #2 Right,Lateral Lower Leg o May shower with protection. Anesthetic (add to Medication List) Wound #1 Left,Anterior Lower Leg o Topical Lidocaine 4% cream applied to wound bed prior to debridement (In Clinic Only). Wound #2 Right,Lateral Lower Leg o Topical Lidocaine 4% cream applied to wound bed prior to debridement (In Clinic Only). Skin Barriers/Peri-Wound Care Wound #1 Left,Anterior Lower Leg o Antifungal cream o Barrier cream o Triamcinolone Acetonide Ointment (TCA) Primary Wound Dressing Wound #1 Left,Anterior Lower Leg o Silver Collagen o Drawtex - over collagen Wound #2 Right,Lateral Lower Leg o Silver Collagen Secondary Dressing Wound #1 Left,Anterior Lower Leg o XtraSorb Wound #2 Right,Lateral Lower Leg o Boardered Foam Dressing Dressing Change Frequency JACKSON-Sylvain, Kwanza K. ([id number]) Wound #1 Left,Anterior Lower Leg o Change dressing every week Wound #2 Right,Lateral Lower Leg o Change dressing every other day. Follow-up Appointments o Return Appointment in 1  week. o Nurse Visit as needed Edema Control Wound #1 Left,Anterior Lower Leg o 3 Layer Compression System - Left Lower Extremity - unna to anchor o Elevate legs to the level of the heart and pump ankles as often as possible Wound #2 Right,Lateral Lower Leg o Other: - double layer tubi grip F Patient Medications Allergies: morphine Notifications Medication Indication Start End Keflex 02/13/2019 DOSE 1 - oral 500 mg capsule - 1 capsule oral taken 3 times a day for 10 days Electronic Signature(s) Signed: 02/13/2019 4:35:51 PM By: Worthy Keeler PA-C Previous Signature: 02/09/2019 9:11:27 AM Version By: Worthy Keeler PA-C Previous Signature: 02/09/2019 4:27:31 PM Version By: Montey Hora Entered By: Worthy Keeler on 02/13/2019 16:35:51 JACKSON-Dexheimer, Chanae K. ([id number]) -------------------------------------------------------------------------------- Problem List Details Patient Name: JACKSON-Reichenberger, Sadeen K. Date of Service: 02/09/2019 8:00 AM Medical Record Number: [id number] Patient Account Number: [id number] Date of Birth/Sex: Mar 27, 1951 (68 y.o. F) Treating RN: Montey Hora Primary Care Provider: Tracie Harrier Other Clinician: Referring Provider: Tracie Harrier Treating Provider/Extender: Melburn Hake, HOYT Weeks in Treatment: 5 Active Problems ICD-10 Evaluated Encounter Code Description Active Date Today Diagnosis E11.9 Type 2 diabetes mellitus without complications 2/50/5397 No Yes L97.222 Non-pressure chronic ulcer of left calf with fat layer exposed 01/03/2019 No Yes L97.812 Non-pressure chronic ulcer of other part of right lower leg 02/02/2019 No Yes with fat layer exposed Inactive Problems Resolved Problems Electronic Signature(s) Signed: 02/09/2019 9:11:27 AM By: Worthy Keeler PA-C Entered By: Worthy Keeler on 02/09/2019 08:19:34 JACKSON-Chevere, Yvett K.  ([id number]) -------------------------------------------------------------------------------- Progress Note Details Patient Name: JACKSON-Shugars, Lindie K. Date of Service: 02/09/2019 8:00 AM Medical Record Number: [id number] Patient Account Number: [id number] Date of Birth/Sex: Nov 22, 1950 (68 y.o. F) Treating RN: Montey Hora Primary Care Provider: Tracie Harrier Other Clinician: Referring Provider: Tracie Harrier Treating Provider/Extender: Melburn Hake, HOYT Weeks in Treatment: 5 Subjective Chief Complaint Information obtained from Patient Left Leg ulcer x 2 weeks History of Present Illness (HPI) Pleasant 68 year old Caucasian female who fell down a few steps in her home 2 weeks ago and developed bruises on both legs on the shins, on the left shin area she did have an  open area that scabbed, patient did ED on 8/1 at which point she had dressing applied to the left shin wound, she had a right shin hematoma that was lanced with oozing of blood after. Since that time patient has noticed that the left calf leg wound developed a black covering on top and had started to hurt, she has been dressing this daily with an antibiotic ointment and gauze wrap. 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 She had a traumatic wound on the left lower extremity. We have been using silver alginate because of drainage. She has been in compression 8/26; this was initially a  traumatic wound on the left lower extremity. It has some depth superiorly appears to have come in this week. We have been using silver alginate because of drainage 01/24/19 once again the wrap seems to have come down and she took it off and put Vaseline on the wound on Sunday. She did not call the clinic. About 75% of this is fully granulated 25% is not and that is superiorly. We have been using silver alginate but all changed to silver collagen today. 02/01/19 on evaluation today patient appears to be doing okay in regard to her left anterior lower extremity ulcer. She also has a right lower leg ulcer which really has not been monitored as much at this point due to the fact that it appeared to be doing quite well according to what that [facility] felt. Nonetheless there appears to be some fluid that is leaking from the right side as well upon evaluation today. I think this may need to be addressed at this point unfortunately. I don't think it's just gonna heal on its own at least not very effectively. No fevers, chills, nausea, or vomiting noted at this time. 02/09/2019 today patient appears to be doing a little better in regard to her left lower extremity. On the right lower extremity she actually does have some necrotic tissue noted at this point. Both areas do seem to be draining quite a bit at this time. There is no sign of active infection at this point. Patient History Information obtained from Patient. Family History Diabetes - Siblings, No family history of Cancer, Heart Disease, Hereditary Spherocytosis, Hypertension, Kidney Disease, Lung Disease, Seizures, Stroke, Thyroid Problems, Tuberculosis. Social History Never smoker, Marital Status - Married, Alcohol Use - Never, Drug Use - No History, Caffeine Use - Daily. JACKSON-Habel, Ceara K. ([id number]) Medical History Respiratory Denies history of Aspiration, Asthma, Chronic Obstructive Pulmonary Disease (COPD), Pneumothorax, Sleep  Apnea, Tuberculosis Cardiovascular Patient has history of Congestive Heart Failure, Deep Vein Thrombosis, Hypertension Denies history of Angina, Arrhythmia, Coronary Artery Disease, Hypotension, Myocardial Infarction, Peripheral Arterial Disease, Peripheral Venous Disease, Phlebitis, Vasculitis Endocrine Patient has history of Type II Diabetes Denies history of Type I Diabetes Integumentary (Skin) Denies history of History of Burn, History of pressure wounds Musculoskeletal Patient has history of Osteoarthritis Denies history of Gout, Rheumatoid  Arthritis, Osteomyelitis Neurologic Patient has history of Neuropathy Denies history of Dementia, Quadriplegia, Paraplegia, Seizure Disorder Medical And Surgical History Notes Respiratory hx pulmonary embolism Musculoskeletal OP Review of Systems (ROS) Constitutional Symptoms (General Health) Denies complaints or symptoms of Fatigue, Fever, Chills, Marked Weight Change. Respiratory Denies complaints or symptoms of Chronic or frequent coughs, Shortness of Breath. Cardiovascular Complains or has symptoms of LE edema. Denies complaints or symptoms of Chest pain. Psychiatric Denies complaints or symptoms of Anxiety, Claustrophobia. Objective Constitutional Well-nourished and well-hydrated in no acute distress. Vitals Time Taken: 8:10 AM, Height: 63 in, Weight: 227 lbs, BMI: 40.2, Temperature: 98.2 F, Pulse: 92 bpm, Respiratory Rate: 16 breaths/min, Blood Pressure: 138/67 mmHg. Respiratory normal breathing without difficulty. Psychiatric this patient is able to make decisions and demonstrates good insight into disease process. Alert and Oriented x 3. pleasant JACKSON-Flis, Azariya K. ([id number]) and cooperative. General Notes: Patient's wound bed currently on the left did not require any sharp debridement and overall seems to be doing well is really more the periwound that I think we need to address some of the issues with currently.  I believe a mixture of possibly triamcinolone, zinc, and nystatin may be a good idea she is to try to help the skin to improve somewhat in this region. In regard to the right lower extremity I think at this time the biggest issue I see is that we are going to need to debride away some of the necrotic tissue from the surface of the wound. Once I did perform this debridement today she actually seem to be doing a little bit better the surface of the wound is still not excellent here but again that may take some time. I do believe collagen will be appropriate however. Integumentary (Hair, Skin) Wound #1 status is Open. Original cause of wound was Trauma. The wound is located on the Left,Anterior Lower Leg. The wound measures 2.4cm length x 0.8cm width x 0.3cm depth; 1.508cm^2 area and 0.452cm^3 volume. There is Fat Layer (Subcutaneous Tissue) Exposed exposed. There is no tunneling or undermining noted. There is a large amount of serosanguineous drainage noted. The wound margin is flat and intact. There is large (67-100%) pink, hyper - granulation within the wound bed. There is a small (1-33%) amount of necrotic tissue within the wound bed including Adherent Slough. Wound #2 status is Open. Original cause of wound was Gradually Appeared. The wound is located on the Right,Lateral Lower Leg. The wound measures 0.9cm length x 1.5cm width x 0.1cm depth; 1.06cm^2 area and 0.106cm^3 volume. 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 There was a Excisional Skin/Subcutaneous Tissue Debridement with a total area of 1.35 sq cm  performed by STONE III, HOYT E., PA-C. With the following instrument(s): Curette to remove Viable and Non-Viable tissue/material. Material removed includes Subcutaneous Tissue and Slough and after achieving pain control using Lidocaine 4% Topical Solution. No specimens were taken. A time out was conducted at 08:32, prior to the start of the procedure. A Minimum amount of bleeding was controlled with Pressure. The procedure was tolerated well with a pain level of 0 throughout and a pain level of 0 following the procedure. Post  Debridement Measurements: 0.9cm length x 1.5cm width x 0.2cm depth; 0.212cm^3 volume. Character of Wound/Ulcer Post Debridement is improved. Severity of Tissue Post Debridement is: Fat layer exposed. Post procedure Diagnosis Wound #2: Same as Pre-Procedure Wound #1 Pre-procedure diagnosis of Wound #1 is a Diabetic Wound/Ulcer of the Lower Extremity located on the Left,Anterior Lower Leg . There was a Three Layer Compression Therapy Procedure with a pre-treatment ABI of 1.1 by Montey Hora, RN. Post procedure Diagnosis Wound #1: Same as Pre-Procedure JACKSON-Harju, Mescal K. ([id number]) Plan Wound Cleansing: Wound #1 Left,Anterior Lower Leg: May shower with protection. Wound #2 Right,Lateral Lower Leg: May shower with protection. Anesthetic (add to Medication List): Wound #1 Left,Anterior Lower Leg: Topical Lidocaine 4% cream applied to wound bed prior to debridement (In Clinic Only). Wound #2 Right,Lateral Lower Leg: Topical Lidocaine 4% cream applied to wound bed prior to debridement (In Clinic Only). Skin Barriers/Peri-Wound Care: Wound #1 Left,Anterior Lower Leg: Antifungal cream Barrier cream Triamcinolone Acetonide Ointment (TCA) Primary Wound Dressing: Wound #1 Left,Anterior Lower Leg: Silver Collagen Drawtex - over collagen Wound #2 Right,Lateral Lower Leg: Silver Collagen Secondary Dressing: Wound #1 Left,Anterior Lower Leg: XtraSorb Wound #2  Right,Lateral Lower Leg: Boardered Foam Dressing Dressing Change Frequency: Wound #1 Left,Anterior Lower Leg: Change dressing every week Wound #2 Right,Lateral Lower Leg: Change dressing every other day. Follow-up Appointments: Return Appointment in 1 week. Nurse Visit as needed Edema Control: Wound #1 Left,Anterior Lower Leg: 3 Layer Compression System - Left Lower Extremity - unna to anchor Elevate legs to the level of the heart and pump ankles as often as possible Wound #2 Right,Lateral Lower Leg: Other: - double layer tubi grip F The following medication(s) was prescribed: Keflex oral 500 mg capsule 1 1 capsule oral taken 3 times a day for 10 days starting 02/13/2019 1. I would recommend that we go ahead and continue with the current treatment for the left lower extremity including the collagen to the wound bed we will however add drawtex over top of this in order to hopefully siphon the fluid into the XtraSorb so that it will not run down her leg. 2. I would recommend for the right lower extremity that we use collagen to the base of the wound and we can utilize drawtex JACKSON-Dilday, Dalisha K. ([id number]) over top of this just to help with moisture and drainage control. 3. I recommend the patient elevate her legs as much as possible we are wrapping the left lower extremity with a 3 layer compression wrap on the right lower extremity she is just using double layer Tubigrip for the time being. We will see patient back for reevaluation in 1 week here in the clinic. If anything worsens or changes patient will contact our office for additional recommendations. Electronic Signature(s) Signed: 02/13/2019 4:36:39 PM By: Worthy Keeler PA-C Previous Signature: 02/09/2019 9:10:58 AM Version By: Worthy Keeler PA-C Entered By: Worthy Keeler on 02/13/2019 16:36:39 JACKSON-Kwiatek, Smita K.  ([id number]) -------------------------------------------------------------------------------- ROS/PFSH Details Patient Name: JACKSON-Banfill, Angelyse K. Date of Service: 02/09/2019 8:00 AM Medical Record Number: [id number] Patient Account Number: [id number] Date of Birth/Sex: 21-Oct-1950 (68 y.o. F) Treating RN: Montey Hora Primary Care Provider: Tracie Harrier Other Clinician: Referring Provider: Tracie Harrier Treating Provider/Extender: Melburn Hake, HOYT Weeks in Treatment: 5 Information Obtained From Patient Constitutional Symptoms (General Health) Complaints and Symptoms: Negative for: Fatigue; Fever; Chills; Marked Weight Change Respiratory Complaints and Symptoms: Negative for: Chronic or frequent coughs; Shortness of Breath Medical History: Negative for: Aspiration; Asthma; Chronic Obstructive Pulmonary  Disease (COPD); Pneumothorax; Sleep Apnea; Tuberculosis Past Medical History Notes: hx pulmonary embolism Cardiovascular Complaints and Symptoms: Positive for: LE edema Negative for: Chest pain Medical History: Positive for: Congestive Heart Failure; Deep Vein Thrombosis; Hypertension Negative for: Angina; Arrhythmia; Coronary Artery Disease; Hypotension; Myocardial Infarction; Peripheral Arterial Disease; Peripheral Venous Disease; Phlebitis; Vasculitis Psychiatric Complaints and Symptoms: Negative for: Anxiety; Claustrophobia Endocrine Medical History: Positive for: Type II Diabetes Negative for: Type I Diabetes Time with diabetes: 5 years Treated with: Oral agents Blood sugar tested every day: Yes Tested : QD Integumentary (Skin) Medical History: Negative for: History of Burn; History of pressure wounds JACKSON-Santellan, Lauryn K. ([id number]) Musculoskeletal Medical History: Positive for: Osteoarthritis Negative for: Gout; Rheumatoid Arthritis; Osteomyelitis Past Medical History Notes: OP Neurologic Medical History: Positive for: Neuropathy Negative  for: Dementia; Quadriplegia; Paraplegia; Seizure Disorder Immunizations Pneumococcal Vaccine: Received Pneumococcal Vaccination: Yes Implantable Devices None Family and Social History Cancer: No; Diabetes: Yes - Siblings; Heart Disease: No; Hereditary Spherocytosis: No; Hypertension: No; Kidney Disease: No; Lung Disease: No; Seizures: No; Stroke: No; Thyroid Problems: No; Tuberculosis: No; Never smoker; Marital Status - Married; Alcohol Use: Never; Drug Use: No History; Caffeine Use: Daily; Financial Concerns: No; Food, Clothing or Shelter Needs: No; Support System Lacking: No; Transportation Concerns: No Physician Affirmation I have reviewed and agree with the above information. Electronic Signature(s) Signed: 02/09/2019 9:11:27 AM By: Worthy Keeler PA-C Signed: 02/09/2019 4:27:31 PM By: Montey Hora Entered By: Worthy Keeler on 02/09/2019 09:09:25 JACKSON-Ozer, Dari K. ([id number]) -------------------------------------------------------------------------------- SuperBill Details Patient Name: JACKSON-Scarpulla, Shakeisha K. Date of Service: 02/09/2019 Medical Record Number: [id number] Patient Account Number: [id number] Date of Birth/Sex: September 06, 1950 (68 y.o. 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

## 2019-02-09 NOTE — Telephone Encounter (Signed)
Patient not at home. Talked with husband and he states that he thinks she is doing ok. Instructed for her to call if needed.

## 2019-02-13 NOTE — Progress Notes (Signed)
Kara [name] ([id number]) Visit Report for 02/09/2019 Arrival Information Details Patient Name: Kara [name], [name] Date of Service: 02/09/2019 8:00 AM Medical Record Number: [id number] Patient Account Number: [id number] Date of Birth/Sex: [birth year] (68 y.o. F) Treating RN: Montey Hora Primary Care Cicely Ortner: Tracie Harrier Other Clinician: Referring Bing Duffey: Tracie Harrier Treating Treshawn Allen/Extender: Melburn Hake, HOYT Weeks in Treatment: 5 Visit Information History Since Last Visit Added or deleted any medications: No Patient Arrived: Cane Any new allergies or adverse reactions: No Arrival Time: 08:08 Had a fall or experienced change in No Accompanied By: self activities of daily living that may affect Transfer Assistance: None risk of falls: Patient Identification Verified: Yes Signs or symptoms of abuse/neglect since last visito No Secondary Verification Process Yes Hospitalized since last visit: No Completed: Implantable device outside of the clinic excluding No Patient Has Alerts: Yes cellular tissue based products placed in the center Patient Alerts: Patient on Blood since last visit: Thinner Has Dressing in Place as Prescribed: Yes Warfarin Has Compression in Place as Prescribed: Yes DMII Pain Present Now: No Electronic Signature(s) Signed: 02/13/2019 1:07:49 PM By: Lorine Bears RCP, RRT, CHT Entered By: Lorine Bears on 02/09/2019 08:10:40 JACKSON-Pereda, Rhyann K. ([id number]) -------------------------------------------------------------------------------- Compression Therapy Details Patient Name: [name], [name] Date of Service: 02/09/2019 8:00 AM Medical Record Number: [id number] Patient Account Number: [id number] Date of Birth/Sex: [birth year] (68 y.o. F) Treating RN: Montey Hora Primary Care Bettie Swavely: Tracie Harrier Other Clinician: Referring Wright Gravely: Tracie Harrier Treating  Bruna Dills/Extender: Melburn Hake, HOYT Weeks in Treatment: 5 Compression Therapy Performed for Wound Assessment: Wound #1 Left,Anterior Lower Leg Performed By: Clinician Montey Hora, RN Compression Type: Three Layer Pre Treatment ABI: 1.1 Post Procedure Diagnosis Same as Pre-procedure Electronic Signature(s) Signed: 02/09/2019 4:27:31 PM By: Montey Hora Entered By: Montey Hora on 02/09/2019 08:39:46 JACKSON-Steinruck, Jaline K. ([id number]) -------------------------------------------------------------------------------- Encounter Discharge Information Details Patient Name: [name], [name] Date of Service: 02/09/2019 8:00 AM Medical Record Number: [id number] Patient Account Number: [id number] Date of Birth/Sex: 07-20-1950 (68 y.o. F) Treating RN: Montey Hora Primary Care Bethanny Toelle: Tracie Harrier Other Clinician: Referring Indiana Gamero: Tracie Harrier Treating Triston Lisanti/Extender: Melburn Hake, HOYT Weeks in Treatment: 5 Encounter Discharge Information Items Post Procedure Vitals Discharge Condition: Stable Temperature (F): 98.0 Ambulatory Status: Ambulatory Pulse (bpm): 92 Discharge Destination: Home Respiratory Rate (breaths/min): 16 Transportation: Private Auto Blood Pressure (mmHg): 138/67 Accompanied By: self Schedule Follow-up Appointment: Yes Clinical Summary of Care: Electronic Signature(s) Signed: 02/09/2019 4:27:31 PM By: Montey Hora Entered By: Montey Hora on 02/09/2019 08:44:12 JACKSON-Glatt, Narmeen K. ([id number]) -------------------------------------------------------------------------------- Lower Extremity Assessment Details Patient Name: [name], [name] Date of Service: 02/09/2019 8:00 AM Medical Record Number: [id number] Patient Account Number: [id number] Date of Birth/Sex: [birth year] (68 y.o. F) Treating RN: Cornell Barman Primary Care Cala Kruckenberg: Tracie Harrier Other Clinician: Referring Yisroel Mullendore: Tracie Harrier Treating  Dexter Sauser/Extender: Melburn Hake, HOYT Weeks in Treatment: 5 Edema Assessment Assessed: [Left: No] [Right: No] [Left: Edema] [Right: :] Calf Left: Right: Point of Measurement: 32 cm From Medial Instep 37.8 cm 42 cm Ankle Left: Right: Point of Measurement: 9 cm From Medial Instep 19.5 cm 20 cm Vascular Assessment Pulses: Dorsalis Pedis Palpable: [Left:Yes] [Right:Yes] Electronic Signature(s) Signed: 02/09/2019 3:01:02 PM By: Gretta Cool, BSN, RN, CWS, Kim RN, BSN Entered By: Gretta Cool, BSN, RN, CWS, Kim on 02/09/2019 08:22:13 JACKSON-Lacorte, Madisson K. ([id number]) -------------------------------------------------------------------------------- Multi Wound Chart Details Patient Name: [name], [name] Date of Service: 02/09/2019 8:00 AM Medical Record Number: [id number] Patient Account Number: [id number] Date of Birth/Sex: 05-Jun-1950 (68 y.o. F) Treating RN: Montey Hora  Primary Care Tayon Parekh: Tracie Harrier Other Clinician: Referring Marlyss Cissell: Tracie Harrier Treating Demiyah Fischbach/Extender: Melburn Hake, HOYT Weeks in Treatment: 5 Vital Signs Height(in): 63 Pulse(bpm): 92 Weight(lbs): 227 Blood Pressure(mmHg): 138/67 Body Mass Index(BMI): 40 Temperature(F): 98.2 Respiratory Rate 16 (breaths/min): Photos: [2:No Photos] [N/A:N/A] Wound Location: Left Lower Leg - Anterior Right, Lateral Lower Leg N/A Wounding Event: Trauma Gradually Appeared N/A Primary Etiology: Diabetic Wound/Ulcer of the Diabetic Wound/Ulcer of the N/A Lower Extremity Lower Extremity Secondary Etiology: Trauma, Other N/A N/A Comorbid History: Congestive Heart Failure, N/A N/A Deep Vein Thrombosis, Hypertension, Type II Diabetes, Osteoarthritis, Neuropathy Date Acquired: 12/21/2018 01/30/2019 N/A Weeks of Treatment: 5 1 N/A Wound Status: Open Open N/A Measurements L x W x D 2.4x0.8x0.3 0.9x1.5x0.1 N/A (cm) Area (cm) : 1.508 1.06 N/A Volume (cm) : 0.452 0.106 N/A % Reduction in Area: 71.90% -440.80%  N/A % Reduction in Volume: 88.00% -430.00% N/A Classification: Grade 1 Grade 1 N/A Exudate Amount: Large N/A N/A Exudate Type: Serosanguineous N/A N/A Exudate Color: red, brown N/A N/A Wound Margin: Flat and Intact N/A N/A Granulation Amount: Large (67-100%) N/A N/A Granulation Quality: Pink, Hyper-granulation N/A N/A Necrotic Amount: Small (1-33%) N/A N/A Exposed Structures: Fat Layer (Subcutaneous N/A N/A Tissue) Exposed: Yes JACKSON-Lor, Jennetta K. ([id number]) Fascia: No Tendon: No Muscle: No Joint: No Bone: No Epithelialization: None N/A N/A Treatment Notes Electronic Signature(s) Signed: 02/09/2019 4:27:31 PM By: Montey Hora Entered By: Montey Hora on 02/09/2019 08:29:36 JACKSON-Jentz, Bellah K. ([id number]) -------------------------------------------------------------------------------- Multi-Disciplinary Care Plan Details Patient Name: JACKSON-Ceballos, Myranda K. Date of Service: 02/09/2019 8:00 AM Medical Record Number: [id number] Patient Account Number: [id number] Date of Birth/Sex: 1950-08-12 (68 y.o. F) Treating RN: Montey Hora Primary Care Cebastian Neis: Tracie Harrier Other Clinician: Referring Carmon Brigandi: Tracie Harrier Treating Wess Baney/Extender: Melburn Hake, HOYT Weeks in Treatment: 5 Active Inactive Abuse / Safety / Falls / Self Care Management Nursing Diagnoses: History of Falls Goals: Patient will not experience any injury related to falls Date Initiated: 01/03/2019 Target Resolution Date: 01/17/2019 Goal Status: Active Interventions: Assess fall risk on admission and as needed Notes: Medication Nursing Diagnoses: Knowledge deficit related to medication safety: actual or potential Goals: Patient/caregiver will demonstrate understanding of all current medications Date Initiated: 01/03/2019 Target Resolution Date: 01/03/2019 Goal Status: Active Interventions: Patient/Caregiver given reconciled medication list upon admission, changes in  medications and discharge from the [place] Notes: Necrotic Tissue Nursing Diagnoses: Impaired tissue integrity related to necrotic/devitalized tissue Goals: Necrotic/devitalized tissue will be minimized in the wound bed Date Initiated: 01/03/2019 Target Resolution Date: 01/10/2019 Goal Status: Active Interventions: JACKSON-Seib, Miaya K. ([id number]) Assess patient pain level pre-, during and post procedure and prior to discharge Treatment Activities: Apply topical anesthetic as ordered : 01/03/2019 Excisional debridement : 01/03/2019 Notes: Wound/Skin Impairment Nursing Diagnoses: Impaired tissue integrity Goals: Ulcer/skin breakdown will have a volume reduction of 30% by week 4 Date Initiated: 01/03/2019 Target Resolution Date: 02/03/2019 Goal Status: Active Interventions: Assess patient/caregiver ability to obtain necessary supplies Assess ulceration(s) every visit Treatment Activities: Skin care regimen initiated : 01/03/2019 Topical wound management initiated : 01/03/2019 Notes: Electronic Signature(s) Signed: 02/09/2019 4:27:31 PM By: Montey Hora Entered By: Montey Hora on 02/09/2019 08:29:27 JACKSON-Sabedra, Keosha K. ([id number]) -------------------------------------------------------------------------------- Pain Assessment Details Patient Name: JACKSON-Kibble, Zala K. Date of Service: 02/09/2019 8:00 AM Medical Record Number: [id number] Patient Account Number: [id number] Date of Birth/Sex: 1950/12/24 (68 y.o. F) Treating RN: Montey Hora Primary Care Miachel Nardelli: Tracie Harrier Other Clinician: Referring Fausto Sampedro: Tracie Harrier Treating Audreyanna Butkiewicz/Extender: Melburn Hake, HOYT Weeks in Treatment: 5 Active Problems Location of Pain  Severity and Description of Pain Patient Has Paino No Site Locations Pain Management and Medication Current Pain Management: Electronic Signature(s) Signed: 02/09/2019 4:27:31 PM By: Montey Hora Signed: 02/13/2019 1:07:49  PM By: Becky Sax, Sallie RCP, RRT, CHT Entered By: Lorine Bears on 02/09/2019 08:10:48 JACKSON-Langenfeld, Kadejah K. ([id number]) -------------------------------------------------------------------------------- Patient/Caregiver Education Details Patient Name: JACKSON-Ransome, Mariaguadalupe K. Date of Service: 02/09/2019 8:00 AM Medical Record Number: [id number] Patient Account Number: [id number] Date of Birth/Gender: 04/04/1951 (68 y.o. F) Treating RN: Montey Hora Primary Care Physician: Tracie Harrier Other Clinician: Referring Physician: Tracie Harrier Treating Physician/Extender: Sharalyn Ink in Treatment: 5 Education Assessment Education Provided To: Patient Education Topics Provided Wound/Skin Impairment: Handouts: Other: wound care as ordered Methods: Demonstration, Explain/Verbal Responses: State content correctly Electronic Signature(s) Signed: 02/09/2019 4:27:31 PM By: Montey Hora Entered By: Montey Hora on 02/09/2019 08:40:11 JACKSON-Ijames, Brynlyn K. ([id number]) -------------------------------------------------------------------------------- Wound Assessment Details Patient Name: JACKSON-Moren, Shaleka K. Date of Service: 02/09/2019 8:00 AM Medical Record Number: [id number] Patient Account Number: [id number] Date of Birth/Sex: 04/09/1951 (68 y.o. F) Treating RN: Cornell Barman Primary Care Hermine Feria: Tracie Harrier Other Clinician: Referring Rosilyn Coachman: Tracie Harrier Treating Larinda Herter/Extender: Melburn Hake, HOYT Weeks in Treatment: 5 Wound Status Wound Number: 1 Primary Diabetic Wound/Ulcer of the Lower Extremity Etiology: Wound Location: Left Lower Leg - Anterior Secondary Trauma, Other Wounding Event: Trauma Etiology: Date Acquired: 12/21/2018 Wound Open Weeks Of Treatment: 5 Status: Clustered Wound: No Comorbid Congestive Heart Failure, Deep Vein History: Thrombosis, Hypertension, Type II Diabetes, Osteoarthritis,  Neuropathy Photos Wound Measurements Length: (cm) 2.4 Width: (cm) 0.8 Depth: (cm) 0.3 Area: (cm) 1.508 Volume: (cm) 0.452 % Reduction in Area: 71.9% % Reduction in Volume: 88% Epithelialization: None Tunneling: No Undermining: No Wound Description Classification: Grade 1 Foul Odor Wound Margin: Flat and Intact Slough/Fib Exudate Amount: Large Exudate Type: Serosanguineous Exudate Color: red, brown After Cleansing: No rino Yes Wound Bed Granulation Amount: Large (67-100%) Exposed Structure Granulation Quality: Pink, Hyper-granulation Fascia Exposed: No Necrotic Amount: Small (1-33%) Fat Layer (Subcutaneous Tissue) Exposed: Yes Necrotic Quality: Adherent Slough Tendon Exposed: No Muscle Exposed: No Joint Exposed: No Bone Exposed: No JACKSON-Samara, Luvina K. ([id number]) Treatment Notes Wound #1 (Left, Anterior Lower Leg) Notes TCA/Zinc/nystatin to periwound, Sliver [place], drawtex, x-sorb, 3layer unna to Engineer, production) Signed: 02/09/2019 3:01:02 PM By: Gretta Cool, BSN, RN, CWS, Kim RN, BSN Entered By: Gretta Cool, BSN, RN, CWS, Kim on 02/09/2019 08:19:04 JACKSON-Lonsway, Laquana K. ([id number]) -------------------------------------------------------------------------------- Wound Assessment Details Patient Name: JACKSON-Broaddus, Asiyah K. Date of Service: 02/09/2019 8:00 AM Medical Record Number: [id number] Patient Account Number: [id number] Date of Birth/Sex: 06/11/1950 (68 y.o. F) Treating RN: Cornell Barman Primary Care Verdine Grenfell: Tracie Harrier Other Clinician: Referring Brighton Pilley: Tracie Harrier Treating Shanetra Blumenstock/Extender: Melburn Hake, HOYT Weeks in Treatment: 5 Wound Status Wound Number: 2 Primary Diabetic Wound/Ulcer of the Lower Etiology: Extremity Wound Location: Right, Lateral Lower Leg Wound Status: Open Wounding Event: Gradually Appeared Date Acquired: 01/30/2019 Weeks Of Treatment: 1 Clustered Wound: No Photos Photo Uploaded By: Gretta Cool, BSN, RN,  CWS, Kim on 02/12/2019 09:11:29 Wound Measurements Length: (cm) 0.9 Width: (cm) 1.5 Depth: (cm) 0.1 Area: (cm) 1.06 Volume: (cm) 0.106 % Reduction in Area: -440.8% % Reduction in Volume: -430% Wound Description Classification: Grade 1 Treatment Notes Wound #2 (Right, Lateral Lower Leg) Notes silver collagen, drawtex and BFD Electronic Signature(s) Signed: 02/09/2019 3:01:02 PM By: Gretta Cool, BSN, RN, CWS, Kim RN, BSN Entered By: Gretta Cool, BSN, RN, CWS, Kim on 02/09/2019 08:19:31 JACKSON-Forgue, Nasra K. ([id number]) -------------------------------------------------------------------------------- Vitals Details Patient Name: JACKSON-Benoist, Reka K. Date of  Service: 02/09/2019 8:00 AM Medical Record Number: [id number] Patient Account Number: [id number] Date of Birth/Sex: [birth year] (68 y.o. F) Treating RN: Montey Hora Primary Care Anjelika Ausburn: Tracie Harrier Other Clinician: Referring Grenda Lora: Tracie Harrier Treating Burnette Sautter/Extender: Melburn Hake, HOYT Weeks in Treatment: 5 Vital Signs Time Taken: 08:10 Temperature (F): 98.2 Height (in): 63 Pulse (bpm): 92 Weight (lbs): 227 Respiratory Rate (breaths/min): 16 Body Mass Index (BMI): 40.2 Blood Pressure (mmHg): 138/67 Reference Range: 80 - 120 mg / dl Electronic Signature(s) Signed: 02/13/2019 1:07:49 PM By: Lorine Bears RCP, RRT, CHT Entered By: Lorine Bears on 02/09/2019 08:11:43

## 2019-02-15 ENCOUNTER — Encounter: Payer: PPO | Admitting: Physician Assistant

## 2019-02-15 ENCOUNTER — Other Ambulatory Visit: Payer: Self-pay

## 2019-02-15 DIAGNOSIS — E11622 Type 2 diabetes mellitus with other skin ulcer: Secondary | ICD-10-CM | POA: Diagnosis not present

## 2019-02-15 DIAGNOSIS — S81801A Unspecified open wound, right lower leg, initial encounter: Secondary | ICD-10-CM | POA: Diagnosis not present

## 2019-02-15 DIAGNOSIS — L97822 Non-pressure chronic ulcer of other part of left lower leg with fat layer exposed: Secondary | ICD-10-CM | POA: Diagnosis not present

## 2019-02-15 DIAGNOSIS — L97222 Non-pressure chronic ulcer of left calf with fat layer exposed: Secondary | ICD-10-CM | POA: Diagnosis not present

## 2019-02-15 NOTE — Progress Notes (Signed)
RECHEL, DELOSREYES ([id number]) Visit Report for 02/15/2019 Arrival Information Details Patient Name: SCHRINER, Jianni K. Date of Service: 02/15/2019 8:45 AM Medical Record Number: [id number] Patient Account Number: [id number] Date of Birth/Sex: [birth year] (68 y.o. F) Treating RN: Harold Barban Primary Care Mikelle Myrick: Tracie Harrier Other Clinician: Referring Sumayyah Custodio: Tracie Harrier Treating Oriel Rumbold/Extender: Melburn Hake, HOYT Weeks in Treatment: 6 Visit Information History Since Last Visit Added or deleted any medications: No Patient Arrived: Cane Any new allergies or adverse reactions: No Arrival Time: 08:48 Had a fall or experienced change in No Accompanied By: self activities of daily living that may affect Transfer Assistance: None risk of falls: Patient Identification Verified: Yes Signs or symptoms of abuse/neglect since last visito No Secondary Verification Process Yes Hospitalized since last visit: No Completed: Has Dressing in Place as Prescribed: Yes Patient Has Alerts: Yes Has Compression in Place as Prescribed: No Patient Alerts: Patient on Blood Pain Present Now: Yes Thinner Warfarin DMII Electronic Signature(s) Signed: 02/15/2019 4:36:59 PM By: Harold Barban Entered By: Harold Barban on 02/15/2019 08:48:39 Pike, Sulamita K. ([id number]) -------------------------------------------------------------------------------- Clinic Level of Care Assessment Details Patient Name: JACKSON-Hitson, Shatarra K. Date of Service: 02/15/2019 8:45 AM Medical Record Number: [id number] Patient Account Number: [id number] Date of Birth/Sex: 09-08-1950 (68 y.o. F) Treating RN: Army Melia Primary Care Shahid Flori: Tracie Harrier Other Clinician: Referring Adel Neyer: Tracie Harrier Treating Anakin Varkey/Extender: Melburn Hake, HOYT Weeks in Treatment: 6 Clinic Level of Care Assessment Items TOOL 4 Quantity Score []  - Use when only an EandM is performed on  FOLLOW-UP visit 0 ASSESSMENTS - Nursing Assessment / Reassessment X - Reassessment of Co-morbidities (includes updates in patient status) 1 10 X- 1 5 Reassessment of Adherence to Treatment Plan ASSESSMENTS - Wound and Skin Assessment / Reassessment []  - Simple Wound Assessment / Reassessment - one wound 0 X- 3 5 Complex Wound Assessment / Reassessment - multiple wounds []  - 0 Dermatologic / Skin Assessment (not related to wound area) ASSESSMENTS - Focused Assessment []  - Circumferential Edema Measurements - multi extremities 0 []  - 0 Nutritional Assessment / Counseling / Intervention []  - 0 Lower Extremity Assessment (monofilament, tuning fork, pulses) []  - 0 Peripheral Arterial Disease Assessment (using hand held doppler) ASSESSMENTS - Ostomy and/or Continence Assessment and Care []  - Incontinence Assessment and Management 0 []  - 0 Ostomy Care Assessment and Management (repouching, etc.) PROCESS - Coordination of Care X - Simple Patient / Family Education for ongoing care 1 15 []  - 0 Complex (extensive) Patient / Family Education for ongoing care []  - 0 Staff obtains Programmer, systems, Records, Test Results / Process Orders []  - 0 Staff telephones HHA, Nursing Homes / Clarify orders / etc []  - 0 Routine Transfer to another Facility (non-emergent condition) []  - 0 Routine Hospital Admission (non-emergent condition) []  - 0 New Admissions / Biomedical engineer / Ordering NPWT, Apligraf, etc. []  - 0 Emergency Hospital Admission (emergent condition) X- 1 10 Simple Discharge Coordination JACKSON-Ehmann, Nioka K. ([id number]) []  - 0 Complex (extensive) Discharge Coordination PROCESS - Special Needs []  - Pediatric / Minor Patient Management 0 []  - 0 Isolation Patient Management []  - 0 Hearing / Language / Visual special needs []  - 0 Assessment of Community assistance (transportation, D/C planning, etc.) []  - 0 Additional assistance / Altered mentation []  - 0 Support  Surface(s) Assessment (bed, cushion, seat, etc.) INTERVENTIONS - Wound Cleansing / Measurement []  - Simple Wound Cleansing - one wound 0 X- 3 5 Complex Wound Cleansing - multiple wounds []  - 0 Wound Imaging (photographs -  any number of wounds) []  - 0 Wound Tracing (instead of photographs) []  - 0 Simple Wound Measurement - one wound X- 3 5 Complex Wound Measurement - multiple wounds INTERVENTIONS - Wound Dressings []  - Small Wound Dressing one or multiple wounds 0 X- 3 15 Medium Wound Dressing one or multiple wounds []  - 0 Large Wound Dressing one or multiple wounds []  - 0 Application of Medications - topical []  - 0 Application of Medications - injection INTERVENTIONS - Miscellaneous []  - External ear exam 0 []  - 0 Specimen Collection (cultures, biopsies, blood, body fluids, etc.) []  - 0 Specimen(s) / Culture(s) sent or taken to Lab for analysis []  - 0 Patient Transfer (multiple staff / Civil Service fast streamer / Similar devices) []  - 0 Simple Staple / Suture removal (25 or less) []  - 0 Complex Staple / Suture removal (26 or more) []  - 0 Hypo / Hyperglycemic Management (close monitor of Blood Glucose) []  - 0 Ankle / Brachial Index (ABI) - do not check if billed separately X- 1 5 Vital Signs JACKSON-Bechtol, Cela K. ([id number]) Has the patient been seen at the hospital within the last three years: Yes Total Score: 135 Level Of Care: New/Established - Level 4 Electronic Signature(s) Signed: 02/15/2019 9:37:53 AM By: Army Melia Entered By: Army Melia on 02/15/2019 09:25:27 JACKSON-Lingafelter, Verdell K. ([id number]) -------------------------------------------------------------------------------- Encounter Discharge Information Details Patient Name: JACKSON-Enke, Mallori K. Date of Service: 02/15/2019 8:45 AM Medical Record Number: [id number] Patient Account Number: [id number] Date of Birth/Sex: 26-Jan-1951 (68 y.o. F) Treating RN: Army Melia Primary Care Lataysha Vohra: Tracie Harrier  Other Clinician: Referring Meshell Abdulaziz: Tracie Harrier Treating Creasie Lacosse/Extender: Melburn Hake, HOYT Weeks in Treatment: 6 Encounter Discharge Information Items Discharge Condition: Stable Ambulatory Status: Ambulatory Discharge Destination: Home Transportation: Other Accompanied By: self Schedule Follow-up Appointment: Yes Clinical Summary of Care: Electronic Signature(s) Signed: 02/15/2019 9:37:53 AM By: Army Melia Entered By: Army Melia on 02/15/2019 09:27:38 JACKSON-Eisenberg, Maecie K. ([id number]) -------------------------------------------------------------------------------- Lower Extremity Assessment Details Patient Name: JACKSON-Carachure, Lama K. Date of Service: 02/15/2019 8:45 AM Medical Record Number: [id number] Patient Account Number: [id number] Date of Birth/Sex: 10/31/1950 (68 y.o. F) Treating RN: Harold Barban Primary Care Robertson Colclough: Tracie Harrier Other Clinician: Referring Briauna Gilmartin: Tracie Harrier Treating Benjamin Merrihew/Extender: Melburn Hake, HOYT Weeks in Treatment: 6 Edema Assessment Assessed: [Left: No] [Right: No] Edema: [Left: Yes] [Right: Yes] Calf Left: Right: Point of Measurement: 32 cm From Medial Instep 37.3 cm 40.5 cm Ankle Left: Right: Point of Measurement: 9 cm From Medial Instep 21.5 cm 19.5 cm Vascular Assessment Pulses: Dorsalis Pedis Palpable: [Left:Yes] [Right:Yes] Electronic Signature(s) Signed: 02/15/2019 4:36:59 PM By: Harold Barban Entered By: Harold Barban on 02/15/2019 09:00:19 JACKSON-Drzewiecki, Aliayah K. ([id number]) -------------------------------------------------------------------------------- Multi Wound Chart Details Patient Name: JACKSON-Weigand, Ether K. Date of Service: 02/15/2019 8:45 AM Medical Record Number: [id number] Patient Account Number: [id number] Date of Birth/Sex: 12-24-1950 (68 y.o. F) Treating RN: Army Melia Primary Care Shan Padgett: Tracie Harrier Other Clinician: Referring Braxtyn Bojarski: Tracie Harrier Treating Leavy Heatherly/Extender: Melburn Hake, HOYT Weeks in Treatment: 6 Vital Signs Height(in): 63 Pulse(bpm): 83 Weight(lbs): 227 Blood Pressure(mmHg): 145/69 Body Mass Index(BMI): 40 Temperature(F): 97.6 Respiratory Rate 18 (breaths/min): Photos: [3:No Photos] Wound Location: Left Lower Leg - Anterior Right Lower Leg - Lateral Left Lower Leg - Medial, Posterior Wounding Event: Trauma Gradually Appeared Gradually Appeared Primary Etiology: Diabetic Wound/Ulcer of the Diabetic Wound/Ulcer of the Diabetic Wound/Ulcer of the Lower Extremity Lower Extremity Lower Extremity Secondary Etiology: Trauma, Other N/A N/A Comorbid History: Congestive Heart Failure, Congestive Heart Failure, Congestive Heart Failure, Deep Vein Thrombosis,  Deep Vein Thrombosis, Deep Vein Thrombosis, Hypertension, Type II Hypertension, Type II Hypertension, Type II Diabetes, Osteoarthritis, Diabetes, Osteoarthritis, Diabetes, Osteoarthritis, Neuropathy Neuropathy Neuropathy Date Acquired: 12/21/2018 01/30/2019 02/09/2019 Weeks of Treatment: 6 2 0 Wound Status: Open Open Open Measurements L x W x D 1.7x0.5x0.3 0.9x1.2x0.1 1.4x1.5x0.1 (cm) Area (cm) : 0.668 0.848 1.649 Volume (cm) : 0.2 0.085 0.165 % Reduction in Area: 87.60% -332.70% N/A % Reduction in Volume: 94.70% -325.00% N/A Classification: Grade 1 Grade 1 Grade 2 Exudate Amount: Medium Medium Medium Exudate Type: Serosanguineous Serous Serosanguineous Exudate Color: red, brown amber red, brown Wound Margin: Flat and Intact Distinct, outline attached Distinct, outline attached Granulation Amount: Large (67-100%) Small (1-33%) Large (67-100%) Granulation Quality: Pink, Hyper-granulation Pink Hyper-granulation Necrotic Amount: Small (1-33%) Large (67-100%) Small (1-33%) Exposed Structures: JACKSON-Edwards, Keisy K. ([id number]) Fat Layer (Subcutaneous Fat Layer (Subcutaneous Fat Layer (Subcutaneous Tissue) Exposed: Yes Tissue) Exposed:  Yes Tissue) Exposed: Yes Fascia: No Fascia: No Fascia: No Tendon: No Tendon: No Tendon: No Muscle: No Muscle: No Muscle: No Joint: No Joint: No Joint: No Bone: No Bone: No Bone: No Epithelialization: None None None Treatment Notes Electronic Signature(s) Signed: 02/15/2019 9:37:53 AM By: Army Melia Entered By: Army Melia on 02/15/2019 09:06:46 JACKSON-Siedschlag, Makhayla K. ([id number]) -------------------------------------------------------------------------------- Mack Details Patient Name: JACKSON-Haag, Sunya K. Date of Service: 02/15/2019 8:45 AM Medical Record Number: [id number] Patient Account Number: [id number] Date of Birth/Sex: 1950/09/10 (68 y.o. F) Treating RN: Army Melia Primary Care Chyler Creely: Tracie Harrier Other Clinician: Referring Voncille Simm: Tracie Harrier Treating Amritha Yorke/Extender: Melburn Hake, HOYT Weeks in Treatment: 6 Active Inactive Abuse / Safety / Falls / Self Care Management Nursing Diagnoses: History of Falls Goals: Patient will not experience any injury related to falls Date Initiated: 01/03/2019 Target Resolution Date: 01/17/2019 Goal Status: Active Interventions: Assess fall risk on admission and as needed Notes: Medication Nursing Diagnoses: Knowledge deficit related to medication safety: actual or potential Goals: Patient/caregiver will demonstrate understanding of all current medications Date Initiated: 01/03/2019 Target Resolution Date: 01/03/2019 Goal Status: Active Interventions: Patient/Caregiver given reconciled medication list upon admission, changes in medications and discharge from the [facility] Notes: Necrotic Tissue Nursing Diagnoses: Impaired tissue integrity related to necrotic/devitalized tissue Goals: Necrotic/devitalized tissue will be minimized in the wound bed Date Initiated: 01/03/2019 Target Resolution Date: 01/10/2019 Goal Status: Active Interventions: JACKSON-Silverman, Darrian  K. ([id number]) Assess patient pain level pre-, during and post procedure and prior to discharge Treatment Activities: Apply topical anesthetic as ordered : 01/03/2019 Excisional debridement : 01/03/2019 Notes: Wound/Skin Impairment Nursing Diagnoses: Impaired tissue integrity Goals: Ulcer/skin breakdown will have a volume reduction of 30% by week 4 Date Initiated: 01/03/2019 Target Resolution Date: 02/03/2019 Goal Status: Active Interventions: Assess patient/caregiver ability to obtain necessary supplies Assess ulceration(s) every visit Treatment Activities: Skin care regimen initiated : 01/03/2019 Topical wound management initiated : 01/03/2019 Notes: Electronic Signature(s) Signed: 02/15/2019 9:37:53 AM By: Army Melia Entered By: Army Melia on 02/15/2019 09:06:31 JACKSON-Axelrod, Kennisha K. ([id number]) -------------------------------------------------------------------------------- Pain Assessment Details Patient Name: JACKSON-Antonetti, Laiklyn K. Date of Service: 02/15/2019 8:45 AM Medical Record Number: [id number] Patient Account Number: [id number] Date of Birth/Sex: 1951/03/12 (68 y.o. F) Treating RN: Harold Barban Primary Care Lisa Blakeman: Tracie Harrier Other Clinician: Referring Chandlar Guice: Tracie Harrier Treating Tzivia Oneil/Extender: Melburn Hake, HOYT Weeks in Treatment: 6 Active Problems Location of Pain Severity and Description of Pain Patient Has Paino Yes Site Locations Rate the pain. Current Pain Level: 10 Pain Management and Medication Current Pain Management: Notes Pain below the wound Electronic Signature(s) Signed: 02/15/2019 4:36:59 PM  By: Harold Barban Entered By: Harold Barban on 02/15/2019 08:49:05 JACKSON-Falck, Laquida Raliegh Ip ([id number]) -------------------------------------------------------------------------------- Patient/Caregiver Education Details Patient Name: JACKSON-Tesoro, Samariya K. Date of Service: 02/15/2019 8:45 AM Medical Record Number:  [id number] Patient Account Number: [id number] Date of Birth/Gender: [birth year] (68 y.o. F) Treating RN: Army Melia Primary Care Physician: Tracie Harrier Other Clinician: Referring Physician: Tracie Harrier Treating Physician/Extender: Sharalyn Ink in Treatment: 6 Education Assessment Education Provided To: Patient Education Topics Provided Wound/Skin Impairment: Handouts: Caring for Your Ulcer Methods: Demonstration, Explain/Verbal Responses: State content correctly Electronic Signature(s) Signed: 02/15/2019 9:37:53 AM By: Army Melia Entered By: Army Melia on 02/15/2019 09:25:44 JACKSON-Beagle, Zyrah K. ([id number]) -------------------------------------------------------------------------------- Wound Assessment Details Patient Name: JACKSON-Essex, Caeli K. Date of Service: 02/15/2019 8:45 AM Medical Record Number: [id number] Patient Account Number: [id number] Date of Birth/Sex: [birth year] (68 y.o. F) Treating RN: Harold Barban Primary Care Maghen [facility]: Tracie Harrier Other Clinician: Referring Nikolas Casher: Tracie Harrier Treating Roe Koffman/Extender: STONE III, HOYT Weeks in Treatment: 6 Wound Status Wound Number: 1 Primary Diabetic Wound/Ulcer of the Lower Extremity Etiology: Wound Location: Left Lower Leg - Anterior Secondary Trauma, Other Wounding Event: Trauma Etiology: Date Acquired: 12/21/2018 Wound Open Weeks Of Treatment: 6 Status: Clustered Wound: No Comorbid Congestive Heart Failure, Deep Vein History: Thrombosis, Hypertension, Type II Diabetes, Osteoarthritis, Neuropathy Photos Wound Measurements Length: (cm) 1.7 Width: (cm) 0.5 Depth: (cm) 0.3 Area: (cm) 0.668 Volume: (cm) 0.2 % Reduction in Area: 87.6% % Reduction in Volume: 94.7% Epithelialization: None Tunneling: No Undermining: No Wound Description Classification: Grade 1 Foul Odor Wound Margin: Flat and Intact Slough/Fib Exudate Amount: Medium Exudate Type:  Serosanguineous Exudate Color: red, brown After Cleansing: No rino Yes Wound Bed Granulation Amount: Large (67-100%) Exposed Structure Granulation Quality: Pink, Hyper-granulation Fascia Exposed: No Necrotic Amount: Small (1-33%) Fat Layer (Subcutaneous Tissue) Exposed: Yes Necrotic Quality: Adherent Slough Tendon Exposed: No Muscle Exposed: No Joint Exposed: No Bone Exposed: No JACKSON-Lun, Wynema K. ([id number]) Treatment Notes Wound #1 (Left, Anterior Lower Leg) Notes prisma BFD to the right, ABD and conform to the left, tubigrip F bilateral Electronic Signature(s) Signed: 02/15/2019 4:36:59 PM By: Harold Barban Entered By: Harold Barban on 02/15/2019 08:57:51 JACKSON-Wadleigh, Harla K. ([id number]) -------------------------------------------------------------------------------- Wound Assessment Details Patient Name: JACKSON-Stcyr, Tura K. Date of Service: 02/15/2019 8:45 AM Medical Record Number: [id number] Patient Account Number: [id number] Date of Birth/Sex: 05-10-1951 (68 y.o. F) Treating RN: Harold Barban Primary Care Nohlan Burdin: Tracie Harrier Other Clinician: Referring Stepahnie Campo: Tracie Harrier Treating Nichol Ator/Extender: STONE III, HOYT Weeks in Treatment: 6 Wound Status Wound Number: 2 Primary Diabetic Wound/Ulcer of the Lower Extremity Etiology: Wound Location: Right Lower Leg - Lateral Wound Open Wounding Event: Gradually Appeared Status: Date Acquired: 01/30/2019 Comorbid Congestive Heart Failure, Deep Vein Weeks Of Treatment: 2 History: Thrombosis, Hypertension, Type II Diabetes, Clustered Wound: No Osteoarthritis, Neuropathy Photos Wound Measurements Length: (cm) 0.9 Width: (cm) 1.2 Depth: (cm) 0.1 Area: (cm) 0.848 Volume: (cm) 0.085 % Reduction in Area: -332.7% % Reduction in Volume: -325% Epithelialization: None Tunneling: No Undermining: No Wound Description Classification: Grade 1 Foul Odor A Wound Margin: Distinct, outline  attached Slough/Fibr Exudate Amount: Medium Exudate Type: Serous Exudate Color: amber fter Cleansing: No ino Yes Wound Bed Granulation Amount: Small (1-33%) Exposed Structure Granulation Quality: Pink Fascia Exposed: No Necrotic Amount: Large (67-100%) Fat Layer (Subcutaneous Tissue) Exposed: Yes Necrotic Quality: Adherent Slough Tendon Exposed: No Muscle Exposed: No Joint Exposed: No Bone Exposed: No Treatment Notes JACKSON-Ledyard, Tyffani K. ([id number]) Wound #2 (Right, Lateral Lower Leg) Notes prisma BFD to the right, ABD and conform  to the left, tubigrip F bilateral Electronic Signature(s) Signed: 02/15/2019 4:36:59 PM By: Harold Barban Entered By: Harold Barban on 02/15/2019 08:58:23 JACKSON-Bellville, Monseratt K. ([id number]) -------------------------------------------------------------------------------- Wound Assessment Details Patient Name: JACKSON-Biber, Sanda K. Date of Service: 02/15/2019 8:45 AM Medical Record Number: [id number] Patient Account Number: [id number] Date of Birth/Sex: 08/23/1950 (68 y.o. F) Treating RN: Harold Barban Primary Care Yeilin Zweber: Tracie Harrier Other Clinician: Referring Stefan Markarian: Tracie Harrier Treating Gerson Fauth/Extender: STONE III, HOYT Weeks in Treatment: 6 Wound Status Wound Number: 3 Primary Diabetic Wound/Ulcer of the Lower Extremity Etiology: Wound Location: Left Lower Leg - Medial, Posterior Wound Open Wounding Event: Gradually Appeared Status: Date Acquired: 02/09/2019 Comorbid Congestive Heart Failure, Deep Vein Weeks Of Treatment: 0 History: Thrombosis, Hypertension, Type II Diabetes, Clustered Wound: No Osteoarthritis, Neuropathy Wound Measurements Length: (cm) 1.4 Width: (cm) 1.5 Depth: (cm) 0.1 Area: (cm) 1.649 Volume: (cm) 0.165 % Reduction in Area: % Reduction in Volume: Epithelialization: None Tunneling: No Undermining: No Wound Description Classification: Grade 2 Wound Margin: Distinct, outline  attached Exudate Amount: Medium Exudate Type: Serosanguineous Exudate Color: red, brown Foul Odor After Cleansing: No Slough/Fibrino Yes Wound Bed Granulation Amount: Large (67-100%) Exposed Structure Granulation Quality: Hyper-granulation Fascia Exposed: No Necrotic Amount: Small (1-33%) Fat Layer (Subcutaneous Tissue) Exposed: Yes Necrotic Quality: Adherent Slough Tendon Exposed: No Muscle Exposed: No Joint Exposed: No Bone Exposed: No Treatment Notes Wound #3 (Left, Medial, Posterior Lower Leg) Notes silver cell, ABD, conform, tubi grip F Electronic Signature(s) Signed: 02/15/2019 4:36:59 PM By: Harold Barban Entered By: Harold Barban on 02/15/2019 08:55:09 JACKSON-Feldkamp, Ariyah K. ([id number]) -------------------------------------------------------------------------------- Vitals Details Patient Name: JACKSON-Gorka, Moorea K. Date of Service: 02/15/2019 8:45 AM Medical Record Number: [id number] Patient Account Number: [id number] Date of Birth/Sex: 1950-10-27 (68 y.o. F) Treating RN: Harold Barban Primary Care Farrin Shadle: Tracie Harrier Other Clinician: Referring Amberly Livas: Tracie Harrier Treating Moses Odoherty/Extender: Melburn Hake, HOYT Weeks in Treatment: 6 Vital Signs Time Taken: 08:45 Temperature (F): 97.6 Height (in): 63 Pulse (bpm): 83 Weight (lbs): 227 Respiratory Rate (breaths/min): 18 Body Mass Index (BMI): 40.2 Blood Pressure (mmHg): 145/69 Reference Range: 80 - 120 mg / dl Electronic Signature(s) Signed: 02/15/2019 4:36:59 PM By: Harold Barban Entered By: Harold Barban on 02/15/2019 08:50:07

## 2019-02-15 NOTE — Progress Notes (Addendum)
QUETZALI, HEINLE ([id number]) Visit Report for 02/15/2019 Chief Complaint Document Details Patient Name: Kara [name], [name] Date of Service: 02/15/2019 8:45 AM Medical Record Number: [id number] Patient Account Number: [id number] Date of Birth/Sex: Jul 07, 1950 (68 y.o. F) Treating RN: Army Melia Primary Care Provider: Tracie Harrier Other Clinician: Referring Provider: Tracie Harrier Treating Provider/Extender: Melburn Hake, Quiera Diffee Weeks in Treatment: 6 Information Obtained from: Patient Chief Complaint Left Leg ulcer x 2 weeks Electronic Signature(s) Signed: 02/15/2019 9:04:51 AM By: Worthy Keeler PA-C Entered By: Worthy Keeler on 02/15/2019 09:04:51 Kara [name], [name] ([id number]) -------------------------------------------------------------------------------- HPI Details Patient Name: Kara [name], [name] Date of Service: 02/15/2019 8:45 AM Medical Record Number: [id number] Patient Account Number: [id number] Date of Birth/Sex: [birth year] (68 y.o. F) Treating RN: Army Melia Primary Care Provider: Tracie Harrier Other Clinician: Referring Provider: Tracie Harrier Treating Provider/Extender: Melburn Hake, Jakaleb Payer Weeks in Treatment: 6 History of Present Illness HPI Description: Pleasant 68 year old Caucasian female who fell down a few steps in her home 2 weeks ago and developed bruises on both legs on the shins, on the left shin area she did have an open area that scabbed, patient did ED on 8/1 at which point she had dressing applied to the left shin wound, she had a right shin hematoma that was lanced with oozing of blood after. Since that time patient has noticed that the left calf leg wound developed a black covering on top and had started to hurt, she has been dressing this daily with an antibiotic ointment and gauze wrap. 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 She had a traumatic wound on the left lower extremity. We have been using silver alginate because of drainage. She has been in compression 8/26; this was initially a traumatic wound on the left lower extremity. It has some depth superiorly appears to have come in this week. We have been using silver alginate because of drainage 01/24/19 once again the wrap seems to have come down and she took it off and put Vaseline on the wound on Sunday. She did not call the clinic. About 75% of this is fully granulated 25% is not and that is superiorly. We have been using silver alginate but all changed to silver collagen today. 02/01/19 on evaluation today patient appears to be doing okay in regard to her left anterior lower extremity ulcer. She also has a right lower leg ulcer which really has not been monitored as much at this point due to the fact that it appeared to be doing quite well according to what that [facility] felt. Nonetheless there appears to be some fluid that is leaking from the right side as well upon evaluation today. I think this may need to be addressed at this point unfortunately. I don't think it's just gonna heal on its own at least not very effectively. No fevers, chills, nausea, or vomiting noted at this time. 02/09/2019 today patient appears to be doing  a little better in regard to her left lower extremity. On the right lower extremity she actually does have some necrotic tissue noted at this point. Both areas do seem to be draining quite a bit  at this time. There is no sign of active infection at this point. 02/15/2019 on evaluation today patient unfortunately is having some issues today with a abscess on the medial aspect of her left lower extremity which is new since last week. She states that this actually occurred on Friday she had a blister initially that she took her wrap off and tried to pop. Subsequently since that time it is gotten worse become more painful and appears to be much more infected. There does not appear to be any signs of active systemic infection which is good news. No fever chills noted. She actually called into the office on Tuesday at which point I sent in a prescription for Keflex until she came in today for evaluation. Apparently she called yesterday as well asking what she can do about the pain and was told to take Tylenol as well. Currently she states that this unfortunately is exquisitely tender even to light touch. Unfortunately I do believe this is a much deeper abscess that is even apparent she has a lot of necrotic/hyper granular tissue noted on the surface of the wound. Electronic Signature(s) Signed: 02/15/2019 9:34:32 AM By: Worthy Keeler PA-C Entered By: Worthy Keeler on 02/15/2019 09:34:32 Kara [name], [name] ([id number]) -------------------------------------------------------------------------------- Physical Exam Details Patient Name: Kara [name], [name] Date of Service: 02/15/2019 8:45 AM Medical Record Number: [id number] Patient Account Number: [id number] Date of Birth/Sex: [birth year] (68 y.o. F) Treating RN: Army Melia Primary Care Provider: Tracie Harrier Other Clinician: Referring Provider: Tracie Harrier Treating Provider/Extender: STONE III, Bram Hottel Weeks in Treatment: 6 Constitutional Well-nourished and well-hydrated in no acute distress. Respiratory normal breathing without difficulty. clear to auscultation bilaterally. Cardiovascular regular rate and rhythm  with normal S1, S2. Psychiatric this patient is able to make decisions and demonstrates good insight into disease process. Alert and Oriented x 3. pleasant and cooperative. Notes Patient's wound bed currently again at the new location on the left medial lower extremity showed signs of hyper granular/necrotic tissue which is also exhibiting signs of infection at this time with somewhat purulent drainage noted. Fortunately there is no signs of systemic infection. Really she does need debridement but she was unable to tolerate debridement at all today due to the severity of the pain. She does tell me this seems to be getting a little bit better. However it still is tremendous compared to her baseline. The other 2 wounds seem to be doing well with no procedures needed at the sites. Electronic Signature(s) Signed: 02/15/2019 9:35:21 AM By: Worthy Keeler PA-C Entered By: Worthy Keeler on 02/15/2019 09:35:20 Kara [name], [name] ([id number]) -------------------------------------------------------------------------------- Physician Orders Details Patient Name: Kara [name], [name] Date of Service: 02/15/2019 8:45 AM Medical Record Number: [id number] Patient Account Number: [id number] Date of Birth/Sex: 06/30/1950 (68 y.o. 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 Wound #2 Right,Lateral Lower Leg o  May shower with protection. Wound #3 Left,Medial,Posterior Lower  Leg o May shower with protection. Anesthetic (add to Medication List) Wound #1 Left,Anterior Lower Leg o Topical Lidocaine 4% cream applied to wound bed prior to debridement (In Clinic Only). Wound #2 Right,Lateral Lower Leg o Topical Lidocaine 4% cream applied to wound bed prior to debridement (In Clinic Only). Wound #3 Left,Medial,Posterior Lower Leg o Topical Lidocaine 4% cream applied to wound bed prior to debridement (In Clinic Only). Skin Barriers/Peri-Wound Care Wound #1 Left,Anterior Lower Leg o Antifungal cream o Barrier cream o Triamcinolone Acetonide Ointment (TCA) Wound #2 Right,Lateral Lower Leg o Antifungal cream o Barrier cream o Triamcinolone Acetonide Ointment (TCA) Primary Wound Dressing Wound #1 Left,Anterior Lower Leg o Silver Collagen Wound #2 Right,Lateral Lower Leg o Silver Collagen Kara [name], [name] ([id number]) Wound #3 Left,Medial,Posterior Lower Leg o Silver Alginate Secondary Dressing Wound #1 Left,Anterior Lower Leg o ABD pad Wound #2 Right,Lateral Lower Leg o Boardered Foam Dressing Wound #3 Left,Medial,Posterior Lower Leg o ABD pad Dressing Change Frequency Wound #1 Left,Anterior Lower Leg o Change dressing every other day. Wound #2 Right,Lateral Lower Leg o Change dressing every other day. Wound #3 Left,Medial,Posterior Lower Leg o Change dressing every other day. Follow-up Appointments Wound #1 Left,Anterior Lower Leg o Return Appointment in 1 week. o Nurse Visit as needed Wound #2 Right,Lateral Lower Leg o Return Appointment in 1 week. o Nurse Visit as needed Wound #3 Left,Medial,Posterior Lower Leg o Return Appointment in 1 week. o Nurse Visit as needed Edema Control Wound #1 Left,Anterior Lower Leg o Elevate legs to the level of the heart and pump ankles as often as possible o Other: - bilatera tubi grip F Wound #2 Right,Lateral Lower Leg o Elevate legs to the  level of the heart and pump ankles as often as possible o Other: - bilatera tubi grip F Wound #3 Left,Medial,Posterior Lower Leg o Elevate legs to the level of the heart and pump ankles as often as possible o Other: - bilatera tubi grip F Laboratory o Bacteria identified in Wound by Culture (MICRO) - culture oooo LOINC Code: 5277-8 Kara [name], [name] ([id number]) oooo Convenience Name: Wound culture routine Electronic Signature(s) Signed: 02/15/2019 2:41:20 PM By: Army Melia Signed: 02/15/2019 4:47:50 PM By: Worthy Keeler PA-C Previous Signature: 02/15/2019 9:37:53 AM Version By: Army Melia Entered By: Army Melia on 02/15/2019 14:06:20 JACKSON-Ozburn, Consuella K. ([id number]) -------------------------------------------------------------------------------- Problem List Details Patient Name: JACKSON-San, Cassondra K. Date of Service: 02/15/2019 8:45 AM Medical Record Number: [id number] Patient Account Number: [id number] Date of Birth/Sex: 1950/07/26 (68 y.o. F) Treating RN: Army Melia Primary Care Provider: Tracie Harrier Other Clinician: Referring Provider: Tracie Harrier Treating Provider/Extender: Melburn Hake, Millard Bautch Weeks in Treatment: 6 Active Problems ICD-10 Evaluated Encounter Code Description Active Date Today Diagnosis E11.9 Type 2 diabetes mellitus without complications 6/71/2458 No Yes L97.222 Non-pressure chronic ulcer of left calf with fat layer exposed 01/03/2019 No Yes L97.812 Non-pressure chronic ulcer of other part of right lower leg 02/02/2019 No Yes with fat layer exposed L02.416 Cutaneous abscess of left lower limb 02/15/2019 No Yes Inactive Problems Resolved Problems Electronic Signature(s) Signed: 02/15/2019 9:33:22 AM By: Worthy Keeler PA-C Previous Signature: 02/15/2019 9:04:39 AM Version By: Worthy Keeler PA-C Entered By: Worthy Keeler on 02/15/2019 09:33:22 JACKSON-Marro, Reisa K.  ([id number]) -------------------------------------------------------------------------------- Progress Note Details Patient Name: JACKSON-Tates, Cola K. Date of Service: 02/15/2019 8:45 AM Medical Record Number: [id number] Patient Account Number: [id number] Date of Birth/Sex: 11-May-1951 (68 y.o. F) Treating RN: Army Melia Primary  Care Provider: Tracie Harrier Other Clinician: Referring Provider: Tracie Harrier Treating Provider/Extender: Melburn Hake, Rhyatt Muska Weeks in Treatment: 6 Subjective Chief Complaint Information obtained from Patient Left Leg ulcer x 2 weeks History of Present Illness (HPI) Pleasant 68 year old Caucasian female who fell down a few steps in her home 2 weeks ago and developed bruises on both legs on the shins, on the left shin area she did have an open area that scabbed, patient did ED on 8/1 at which point she had dressing applied to the left shin wound, she had a right shin hematoma that was lanced with oozing of blood after. Since that time patient has noticed that the left calf leg wound developed a black covering on top and had started to hurt, she has been dressing this daily with an antibiotic ointment and gauze wrap. 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 She had a traumatic wound on the left lower extremity. We have been using silver alginate because of drainage. She has been in compression 8/26; this was initially a  traumatic wound on the left lower extremity. It has some depth superiorly appears to have come in this week. We have been using silver alginate because of drainage 01/24/19 once again the wrap seems to have come down and she took it off and put Vaseline on the wound on Sunday. She did not call the clinic. About 75% of this is fully granulated 25% is not and that is superiorly. We have been using silver alginate but all changed to silver collagen today. 02/01/19 on evaluation today patient appears to be doing okay in regard to her left anterior lower extremity ulcer. She also has a right lower leg ulcer which really has not been monitored as much at this point due to the fact that it appeared to be doing quite well according to what that [facility] felt. Nonetheless there appears to be some fluid that is leaking from the right side as well upon evaluation today. I think this may need to be addressed at this point unfortunately. I don't think it's just gonna heal on its own at least not very effectively. No fevers, chills, nausea, or vomiting noted at this time. 02/09/2019 today patient appears to be doing a little better in regard to her left lower extremity. On the right lower extremity she actually does have some necrotic tissue noted at this point. Both areas do seem to be draining quite a bit at this time. There is no sign of active infection at this point. 02/15/2019 on evaluation today patient unfortunately is having some issues today with a abscess on the medial aspect of her left lower extremity which is new since last week. She states that this actually occurred on Friday she had a blister initially that she took her wrap off and tried to pop. Subsequently since that time it is gotten worse become more painful and appears to be much more infected.  There does not appear to be any signs of active systemic infection which is good news. No fever chills noted. She actually called into the office on  Tuesday at which point I sent in a prescription for Keflex until she came in today for evaluation. Apparently she called yesterday as well asking what she can do about the pain and was told to take Tylenol as well. Currently she states that this unfortunately is exquisitely tender even to light touch. Unfortunately I do believe this is a much deeper abscess that is even apparent she has a lot of necrotic/hyper granular tissue noted on the surface of the wound. Patient History JACKSON-Metoyer, Dezerae K. ([id number]) Information obtained from Patient. Family History Diabetes - Siblings, No family history of Cancer, Heart Disease, Hereditary Spherocytosis, Hypertension, Kidney Disease, Lung Disease, Seizures, Stroke, Thyroid Problems, Tuberculosis. Social History Never smoker, Marital Status - Married, Alcohol Use - Never, Drug Use - No History, Caffeine Use - Daily. Medical History Respiratory Denies history of Aspiration, Asthma, Chronic Obstructive Pulmonary Disease (COPD), Pneumothorax, Sleep Apnea, Tuberculosis Cardiovascular Patient has history of Congestive Heart Failure, Deep Vein Thrombosis, Hypertension Denies history of Angina, Arrhythmia, Coronary Artery Disease, Hypotension, Myocardial Infarction, Peripheral Arterial Disease, Peripheral Venous Disease, Phlebitis, Vasculitis Endocrine Patient has history of Type II Diabetes Denies history of Type I Diabetes Integumentary (Skin) Denies history of History of Burn, History of pressure wounds Musculoskeletal Patient has history of Osteoarthritis Denies history of Gout, Rheumatoid Arthritis, Osteomyelitis Neurologic Patient has history of Neuropathy Denies history of Dementia, Quadriplegia, Paraplegia, Seizure Disorder Medical And Surgical History Notes Respiratory hx pulmonary embolism Musculoskeletal OP Review of Systems (ROS) Constitutional Symptoms (General Health) Denies complaints or symptoms of Fatigue, Fever, Chills,  Marked Weight Change. Respiratory Denies complaints or symptoms of Chronic or frequent coughs, Shortness of Breath. Cardiovascular Denies complaints or symptoms of Chest pain, LE edema. Psychiatric Denies complaints or symptoms of Anxiety, Claustrophobia. Objective Constitutional Well-nourished and well-hydrated in no acute distress. JACKSON-Hou, Mary-Ann K. ([id number]) Vitals Time Taken: 8:45 AM, Height: 63 in, Weight: 227 lbs, BMI: 40.2, Temperature: 97.6 F, Pulse: 83 bpm, Respiratory Rate: 18 breaths/min, Blood Pressure: 145/69 mmHg. Respiratory normal breathing without difficulty. clear to auscultation bilaterally. Cardiovascular regular rate and rhythm with normal S1, S2. Psychiatric this patient is able to make decisions and demonstrates good insight into disease process. Alert and Oriented x 3. pleasant and cooperative. General Notes: Patient's wound bed currently again at the new location on the left medial lower extremity showed signs of hyper granular/necrotic tissue which is also exhibiting signs of infection at this time with somewhat purulent drainage noted. Fortunately there is no signs of systemic infection. Really she does need debridement but she was unable to tolerate debridement at all today due to the severity of the pain. She does tell me this seems to be getting a little bit better. However it still is tremendous compared to her baseline. The other 2 wounds seem to be doing well with no procedures needed at the sites. Integumentary (Hair, Skin) Wound #1 status is Open. Original cause of wound was Trauma. The wound is located on the Left,Anterior Lower Leg. The wound measures 1.7cm length x 0.5cm width x 0.3cm depth; 0.668cm^2 area and 0.2cm^3 volume. There is Fat Layer (Subcutaneous Tissue) Exposed exposed. There is no tunneling or undermining noted. There is a medium amount of serosanguineous drainage noted. The wound margin is flat and intact. There is large  (67-100%) pink, hyper - granulation within the wound bed. There is  a small (1-33%) amount of necrotic tissue within the wound bed including Adherent Slough. Wound #2 status is Open. Original cause of wound was Gradually Appeared. The wound is located on the Right,Lateral Lower Leg. The wound measures 0.9cm length x 1.2cm width x 0.1cm depth; 0.848cm^2 area and 0.085cm^3 volume. There is Fat Layer (Subcutaneous Tissue) Exposed exposed. There is no tunneling or undermining noted. There is a medium amount of serous drainage noted. The wound margin is distinct with the outline attached to the wound base. There is small (1-33%) pink granulation within the wound bed. There is a large (67-100%) amount of necrotic tissue within the wound bed including Adherent Slough. Wound #3 status is Open. Original cause of wound was Gradually Appeared. The wound is located on the Left,Medial,Posterior Lower Leg. The wound measures 1.4cm length x 1.5cm width x 0.1cm depth; 1.649cm^2 area and 0.165cm^3 volume. There is Fat Layer (Subcutaneous Tissue) Exposed exposed. There is no tunneling or undermining noted. There is a medium amount of serosanguineous drainage noted. The wound margin is distinct with the outline attached to the wound base. There is large (67-100%) hyper - granulation within the wound bed. There is a small (1-33%) amount of necrotic tissue within the wound bed including Adherent Slough. 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 ([id number]) Plan Wound Cleansing: Wound #1 Left,Anterior Lower Leg: May shower with protection. Wound #2 Right,Lateral Lower Leg: May shower with protection. Wound #3 Left,Medial,Posterior Lower Leg: May shower with  protection. Anesthetic (add to Medication List): Wound #1 Left,Anterior Lower Leg: Topical Lidocaine 4% cream applied to wound bed prior to debridement (In Clinic Only). Wound #2 Right,Lateral Lower Leg: Topical Lidocaine 4% cream applied to wound bed prior to debridement (In Clinic Only). Wound #3 Left,Medial,Posterior Lower Leg: Topical Lidocaine 4% cream applied to wound bed prior to debridement (In Clinic Only). Skin Barriers/Peri-Wound Care: Wound #1 Left,Anterior Lower Leg: Antifungal cream Barrier cream Triamcinolone Acetonide Ointment (TCA) Wound #2 Right,Lateral Lower Leg: Antifungal cream Barrier cream Triamcinolone Acetonide Ointment (TCA) Primary Wound Dressing: Wound #1 Left,Anterior Lower Leg: Silver Collagen Wound #2 Right,Lateral Lower Leg: Silver Collagen Wound #3 Left,Medial,Posterior Lower Leg: Silver Alginate Secondary Dressing: Wound #1 Left,Anterior Lower Leg: ABD pad Wound #3 Left,Medial,Posterior Lower Leg: ABD pad Wound #2 Right,Lateral Lower Leg: Boardered Foam Dressing Dressing Change Frequency: Wound #1 Left,Anterior Lower Leg: Change dressing every other day. Wound #2 Right,Lateral Lower Leg: Change dressing every other day. Wound #3 Left,Medial,Posterior Lower Leg: Change dressing every other day. Follow-up Appointments: Wound #1 Left,Anterior Lower Leg: Return Appointment in 1 week. Nurse Visit as needed Wound #2 Right,Lateral Lower Leg: Return Appointment in 1 week. Nurse Visit as needed Wound #3 Left,Medial,Posterior Lower Leg: Return Appointment in 1 week. Nurse Visit as needed Gazella, Anglin Gregoria K. ([id number]) Edema Control: Wound #1 Left,Anterior Lower Leg: Elevate legs to the level of the heart and pump ankles as often as possible Other: - bilatera tubi grip F Wound #2 Right,Lateral Lower Leg: Elevate legs to the level of the heart and pump ankles as often as possible Other: - bilatera tubi grip F Wound #3  Left,Medial,Posterior Lower Leg: Elevate legs to the level of the heart and pump ankles as often as possible Other: - bilatera tubi grip F 1. I would recommend currently that we continue with  the collagen dressing for the 2 original wounds one on the left lower extremity and one on the right lower extremity. #2 with regard to the new abscess on the left medial lower extremity I already have the patient on Keflex after she called in the other day. I am going to go ahead and recommend that we have her use topical silver alginate to the area followed by an ABD pad and secured with Kerlix and Tubigrip we will not wrap her at this point as I think that is a bigger risk. 3. I do think she needs to continue to elevate her legs as much as possible. We will see patient back for reevaluation in 1 week here in the clinic. If anything worsens or changes patient will contact our office for additional recommendations. Electronic Signature(s) Signed: 02/15/2019 9:36:20 AM By: Worthy Keeler PA-C Entered By: Worthy Keeler on 02/15/2019 09:36:20 JACKSON-Oviedo, Aija K. ([id number]) -------------------------------------------------------------------------------- ROS/PFSH Details Patient Name: JACKSON-Geisen, Willona K. Date of Service: 02/15/2019 8:45 AM Medical Record Number: [id number] Patient Account Number: [id number] Date of Birth/Sex: [birth year] (68 y.o. F) Treating RN: Army Melia Primary Care Provider: Tracie Harrier Other Clinician: Referring Provider: Tracie Harrier Treating Provider/Extender: Melburn Hake, Myrtle Barnhard Weeks in Treatment: 6 Information Obtained From Patient Constitutional Symptoms (General Health) Complaints and Symptoms: Negative for: Fatigue; Fever; Chills; Marked Weight Change Respiratory Complaints and Symptoms: Negative for: Chronic or frequent coughs; Shortness of Breath Medical History: Negative for: Aspiration; Asthma; Chronic Obstructive Pulmonary Disease (COPD);  Pneumothorax; Sleep Apnea; Tuberculosis Past Medical History Notes: hx pulmonary embolism Cardiovascular Complaints and Symptoms: Negative for: Chest pain; LE edema Medical History: Positive for: Congestive Heart Failure; Deep Vein Thrombosis; Hypertension Negative for: Angina; Arrhythmia; Coronary Artery Disease; Hypotension; Myocardial Infarction; Peripheral Arterial Disease; Peripheral Venous Disease; Phlebitis; Vasculitis Psychiatric Complaints and Symptoms: Negative for: Anxiety; Claustrophobia Endocrine Medical History: Positive for: Type II Diabetes Negative for: Type I Diabetes Time with diabetes: 5 years Treated with: Oral agents Blood sugar tested every day: Yes Tested : QD Integumentary (Skin) Medical History: Negative for: History of Burn; History of pressure wounds Musculoskeletal JACKSON-Soza, [place] K. ([id number]) Medical History: Positive for: Osteoarthritis Negative for: Gout; Rheumatoid Arthritis; Osteomyelitis Past Medical History Notes: OP Neurologic Medical History: Positive for: Neuropathy Negative for: Dementia; Quadriplegia; Paraplegia; Seizure Disorder Immunizations Pneumococcal Vaccine: Received Pneumococcal Vaccination: Yes Implantable Devices None Family and Social History Cancer: No; Diabetes: Yes - Siblings; Heart Disease: No; Hereditary Spherocytosis: No; Hypertension: No; Kidney Disease: No; Lung Disease: No; Seizures: No; Stroke: No; Thyroid Problems: No; Tuberculosis: No; Never smoker; Marital Status - Married; Alcohol Use: Never; Drug Use: No History; Caffeine Use: Daily; Financial Concerns: No; Food, Clothing or Shelter Needs: No; Support System Lacking: No; Transportation Concerns: No Physician Affirmation I have reviewed and agree with the above information. Electronic Signature(s) Signed: 02/15/2019 9:37:53 AM By: Army Melia Signed: 02/15/2019 4:47:50 PM By: Worthy Keeler PA-C Entered By: Worthy Keeler on 02/15/2019  09:34:47 JACKSON-Royals, Cecily K. ([id number]) -------------------------------------------------------------------------------- SuperBill Details Patient Name: JACKSON-Russon, Marylin K. Date of Service: 02/15/2019 Medical Record Number: [id number] Patient Account Number: [id number] Date of Birth/Sex: Apr 07, 1951 (68 y.o. 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  L02.416 Cutaneous abscess of left lower limb Facility Procedures CPT4 Code: 45848350 Description: 75732 - WOUND CARE VISIT-LEV 4 EST PT Modifier: Quantity: 1 Physician Procedures CPT4 Code Description: [id number] - WC PHYS LEVEL 4 - EST PT ICD-10 Diagnosis Description E11.9 Type 2 diabetes mellitus without complications Z98.022 Non-pressure chronic ulcer of left calf with fat layer exposed L97.812 Non-pressure chronic ulcer of  other part of right lower leg wi L02.416 Cutaneous abscess of left lower limb Modifier: th fat layer expo Quantity: 1 sed Electronic Signature(s) Signed: 02/15/2019 9:36:34 AM By: Worthy Keeler PA-C Entered By: Worthy Keeler on 02/15/2019 09:36:33

## 2019-02-16 ENCOUNTER — Other Ambulatory Visit
Admission: RE | Admit: 2019-02-16 | Discharge: 2019-02-16 | Disposition: A | Discharge status: Home / Self Care | Payer: PPO | Source: Ambulatory Visit | Attending: Physician Assistant | Admitting: Physician Assistant

## 2019-02-16 DIAGNOSIS — B999 Unspecified infectious disease: Secondary | ICD-10-CM | POA: Insufficient documentation

## 2019-02-18 LAB — AEROBIC CULTURE W GRAM STAIN (SUPERFICIAL SPECIMEN)

## 2019-02-20 DIAGNOSIS — Z6841 Body Mass Index (BMI) 40.0 and over, adult: Secondary | ICD-10-CM | POA: Diagnosis not present

## 2019-02-20 DIAGNOSIS — Z86718 Personal history of other venous thrombosis and embolism: Secondary | ICD-10-CM | POA: Diagnosis not present

## 2019-02-20 DIAGNOSIS — Z7901 Long term (current) use of anticoagulants: Secondary | ICD-10-CM | POA: Diagnosis not present

## 2019-02-20 DIAGNOSIS — I10 Essential (primary) hypertension: Secondary | ICD-10-CM | POA: Diagnosis not present

## 2019-02-20 DIAGNOSIS — K5730 Diverticulosis of large intestine without perforation or abscess without bleeding: Secondary | ICD-10-CM | POA: Diagnosis not present

## 2019-02-20 DIAGNOSIS — E1165 Type 2 diabetes mellitus with hyperglycemia: Secondary | ICD-10-CM | POA: Diagnosis not present

## 2019-02-20 DIAGNOSIS — M5136 Other intervertebral disc degeneration, lumbar region: Secondary | ICD-10-CM | POA: Diagnosis not present

## 2019-02-20 DIAGNOSIS — R8290 Unspecified abnormal findings in urine: Secondary | ICD-10-CM | POA: Diagnosis not present

## 2019-02-23 ENCOUNTER — Other Ambulatory Visit: Payer: Self-pay

## 2019-02-23 ENCOUNTER — Encounter: Payer: PPO | Attending: Physician Assistant | Admitting: Physician Assistant

## 2019-02-23 DIAGNOSIS — Z86711 Personal history of pulmonary embolism: Secondary | ICD-10-CM | POA: Diagnosis not present

## 2019-02-23 DIAGNOSIS — L97222 Non-pressure chronic ulcer of left calf with fat layer exposed: Secondary | ICD-10-CM | POA: Diagnosis not present

## 2019-02-23 DIAGNOSIS — S8011XA Contusion of right lower leg, initial encounter: Secondary | ICD-10-CM | POA: Insufficient documentation

## 2019-02-23 DIAGNOSIS — W109XXA Fall (on) (from) unspecified stairs and steps, initial encounter: Secondary | ICD-10-CM | POA: Insufficient documentation

## 2019-02-23 DIAGNOSIS — I509 Heart failure, unspecified: Secondary | ICD-10-CM | POA: Diagnosis not present

## 2019-02-23 DIAGNOSIS — Z86718 Personal history of other venous thrombosis and embolism: Secondary | ICD-10-CM | POA: Insufficient documentation

## 2019-02-23 DIAGNOSIS — Z85038 Personal history of other malignant neoplasm of large intestine: Secondary | ICD-10-CM | POA: Diagnosis not present

## 2019-02-23 DIAGNOSIS — Z885 Allergy status to narcotic agent status: Secondary | ICD-10-CM | POA: Diagnosis not present

## 2019-02-23 DIAGNOSIS — L97821 Non-pressure chronic ulcer of other part of left lower leg limited to breakdown of skin: Secondary | ICD-10-CM | POA: Diagnosis not present

## 2019-02-23 DIAGNOSIS — E1140 Type 2 diabetes mellitus with diabetic neuropathy, unspecified: Secondary | ICD-10-CM | POA: Insufficient documentation

## 2019-02-23 DIAGNOSIS — B9562 Methicillin resistant Staphylococcus aureus infection as the cause of diseases classified elsewhere: Secondary | ICD-10-CM | POA: Diagnosis not present

## 2019-02-23 DIAGNOSIS — I110 Hypertensive heart disease with heart failure: Secondary | ICD-10-CM | POA: Insufficient documentation

## 2019-02-23 DIAGNOSIS — E11622 Type 2 diabetes mellitus with other skin ulcer: Secondary | ICD-10-CM | POA: Diagnosis not present

## 2019-02-23 DIAGNOSIS — S81802A Unspecified open wound, left lower leg, initial encounter: Secondary | ICD-10-CM | POA: Insufficient documentation

## 2019-02-23 DIAGNOSIS — L97811 Non-pressure chronic ulcer of other part of right lower leg limited to breakdown of skin: Secondary | ICD-10-CM | POA: Insufficient documentation

## 2019-02-23 DIAGNOSIS — L97812 Non-pressure chronic ulcer of other part of right lower leg with fat layer exposed: Secondary | ICD-10-CM | POA: Diagnosis not present

## 2019-02-23 DIAGNOSIS — Z7901 Long term (current) use of anticoagulants: Secondary | ICD-10-CM | POA: Insufficient documentation

## 2019-02-23 DIAGNOSIS — L97822 Non-pressure chronic ulcer of other part of left lower leg with fat layer exposed: Secondary | ICD-10-CM | POA: Diagnosis not present

## 2019-02-23 DIAGNOSIS — Z8249 Family history of ischemic heart disease and other diseases of the circulatory system: Secondary | ICD-10-CM | POA: Diagnosis not present

## 2019-02-23 NOTE — Progress Notes (Signed)
KACEE, Kara [name] ([id number]) Visit Report for 02/23/2019 Chief Complaint Document Details Patient Name: Kara [name], Martrice K. Date of Service: 02/23/2019 8:00 AM Medical Record Number: [id number] Patient Account Number: [id number] Date of Birth/Sex: [birth year] (68 y.o. F) Treating RN: Montey Hora Primary Care Provider: Tracie Harrier Other Clinician: Referring Provider: Tracie Harrier Treating Provider/Extender: Melburn Hake, HOYT Weeks in Treatment: 7 Information Obtained from: Patient Chief Complaint Left Leg ulcer x 2 weeks Electronic Signature(s) Signed: 02/23/2019 4:32:36 PM By: Worthy Keeler PA-C Entered By: Worthy Keeler on 02/23/2019 08:19:32 Lowrys, Willona K. ([id number]) -------------------------------------------------------------------------------- HPI Details Patient Name: Kara [name], [name] Date of Service: 02/23/2019 8:00 AM Medical Record Number: [id number] Patient Account Number: [id number] Date of Birth/Sex: 1950-06-30 (68 y.o. F) Treating RN: Montey Hora Primary Care Provider: Tracie Harrier Other Clinician: Referring Provider: Tracie Harrier Treating Provider/Extender: Melburn Hake, HOYT Weeks in Treatment: 7 History of Present Illness HPI Description: Pleasant 68 year old Caucasian female who fell down a few steps in her home 2 weeks ago and developed bruises on both legs on the shins, on the left shin area she did have an open area that scabbed, patient did ED on 8/1 at which point she had dressing applied to the left shin wound, she had a right shin hematoma that was lanced with oozing of blood after. Since that time patient has noticed that the left calf leg wound developed a black covering on top and had started to hurt, she has been dressing this daily with an antibiotic ointment and gauze wrap. 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 She had a traumatic wound on the left lower extremity. We have been using silver alginate because of drainage. She has been in compression 8/26; this was initially a traumatic wound on the left lower extremity. It has some depth superiorly appears to have come in this week. We have been using silver alginate because of drainage 01/24/19 once again the wrap seems to have come down and she took it off and put Vaseline on the wound on Sunday. She did not call the clinic. About 75% of this is fully granulated 25% is not and that is superiorly. We have been using silver alginate but all changed to silver collagen today. 02/01/19 on evaluation today patient appears to be doing okay in regard to her left anterior lower extremity ulcer. She also has a right lower leg ulcer which really has not been monitored as much at this point due to the fact that it appeared to be doing quite well according to what that [facility] felt. Nonetheless there appears to be some fluid that is leaking from the right side as well upon evaluation today. I think this may need to be addressed at this point unfortunately. I don't think it's just gonna heal on its own at least not very effectively. No fevers, chills, nausea, or vomiting noted at this time. 02/09/2019 today patient appears to be doing  a little better in regard to her left lower extremity. On the right lower extremity she actually does have some necrotic tissue noted at this point. Both areas do seem to be draining quite a  bit at this time. There is no sign of active infection at this point. 02/15/2019 on evaluation today patient unfortunately is having some issues today with a abscess on the medial aspect of her left lower extremity which is new since last week. She states that this actually occurred on Friday she had a blister initially that she took her wrap off and tried to pop. Subsequently since that time it is gotten worse become more painful and appears to be much more infected. There does not appear to be any signs of active systemic infection which is good news. No fever chills noted. She actually called into the office on Tuesday at which point I sent in a prescription for Keflex until she came in today for evaluation. Apparently she called yesterday as well asking what she can do about the pain and was told to take Tylenol as well. Currently she states that this unfortunately is exquisitely tender even to light touch. Unfortunately I do believe this is a much deeper abscess that is even apparent she has a lot of necrotic/hyper granular tissue noted on the surface of the wound. 02/23/2019 upon evaluation today patient actually appears to be doing slightly better compared to last evaluation. I did review her wound culture today which showed that she was positive for MRSA. This is sensitive to clindamycin and tetracycline orally only. Fortunately there is no signs of active infection at this time systemically which is good news. No fevers, chills, nausea, vomiting, or diarrhea. Electronic Signature(s) LEVEDA, KENDRIX ([id number]) Signed: 02/23/2019 8:42:57 AM By: Worthy Keeler PA-C Entered By: Worthy Keeler on 02/23/2019 08:42:57 JACKSON-Stanis, Jaylani K. ([id number]) -------------------------------------------------------------------------------- Physical Exam Details Patient Name: Kara [name], [name] Date of Service: 02/23/2019 8:00 AM Medical Record Number: [id number] Patient Account  Number: [id number] Date of Birth/Sex: 28-Nov-1950 (68 y.o. F) Treating RN: Montey Hora Primary Care Provider: Tracie Harrier Other Clinician: Referring Provider: Tracie Harrier Treating Provider/Extender: STONE III, HOYT Weeks in Treatment: 7 Constitutional Well-nourished and well-hydrated in no acute distress. Respiratory normal breathing without difficulty. clear to auscultation bilaterally. Cardiovascular regular rate and rhythm with normal S1, S2. Psychiatric this patient is able to make decisions and demonstrates good insight into disease process. Alert and Oriented x 3. pleasant and cooperative. Notes Patient's wound bed currently at all locations seems to be doing a little bit better but nonetheless still she is having some pain especially on the left medial lower extremity. She does have some slough noted but I did not perform any sharp debridement today as she still has an active infection I do not want to risk spreading anything and causing this to become worse. Electronic Signature(s) Signed: 02/23/2019 8:43:34 AM By: Worthy Keeler PA-C Entered By: Worthy Keeler on 02/23/2019 08:43:33 JACKSON-Oaxaca, Anarely K. ([id number]) -------------------------------------------------------------------------------- Physician Orders Details Patient Name: JACKSON-Tomasini, Destine K. Date of Service: 02/23/2019 8:00 AM Medical Record Number: [id number] Patient Account Number: [id number] Date of Birth/Sex: 1950/12/14 (68 y.o. 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 o Other: - wash head to toe with Hibiclense once daily for 1 week Wound #2 Right,Lateral Lower Leg o May shower with protection. o Other: - wash head to toe with Hibiclense once daily for 1 week Wound #3 Left,Medial,Posterior Lower Leg o May shower with protection. o Other: - wash head to toe with Hibiclense once daily for 1 week Anesthetic (add to Medication List) Wound #1 Left,Anterior Lower Leg o Topical Lidocaine 4% cream applied to wound bed prior to debridement (In Clinic Only). Wound #2 Right,Lateral Lower Leg o Topical Lidocaine 4% cream applied to wound bed prior to debridement (In Clinic Only). Wound #3 Left,Medial,Posterior Lower Leg o Topical Lidocaine 4% cream applied to wound bed prior to debridement (In Clinic Only). Primary Wound Dressing Wound #1 Left,Anterior Lower Leg o Silver Collagen Wound #2 Right,Lateral Lower Leg o Silver Collagen Wound #3 Left,Medial,Posterior Lower Leg o Silver Alginate Secondary Dressing Wound #1 Left,Anterior Lower Leg o ABD pad JACKSON-Grays, Maysel K. ([id number]) Wound #2 Right,Lateral Lower Leg o Boardered Foam Dressing Wound #3 Left,Medial,Posterior Lower Leg o ABD pad Dressing Change Frequency Wound #1 Left,Anterior Lower Leg o Change dressing every other day. Wound #2 Right,Lateral Lower Leg o Change dressing every other day. Wound #3 Left,Medial,Posterior Lower Leg o Change dressing every other day. Follow-up Appointments Wound #1 Left,Anterior Lower Leg o Return Appointment in 1 week. o Nurse Visit as needed Wound #2 Right,Lateral Lower Leg o Return Appointment in 1 week. o Nurse Visit as needed Wound #3 Left,Medial,Posterior Lower Leg o Return Appointment in 1 week. o Nurse Visit as needed Edema Control Wound #1  Left,Anterior Lower Leg o Elevate legs to the level of the heart and pump ankles as often as possible o Other: - TubiGrip F bilateral Wound #2 Right,Lateral Lower Leg o Elevate legs to the level of the heart and pump ankles as often as possible o Other: - TubiGrip F bilateral Wound #3 Left,Medial,Posterior Lower Leg o Elevate legs to the level of the heart and pump ankles as often as possible o Other: - TubiGrip F bilateral Medications-please add to medication list. o Topical Antibiotic - mupirocin - use every night, put a small amount on the tips of your fingers and in each nostril Patient Medications Allergies: morphine Notifications Medication Indication Start End clindamycin HCl 02/23/2019 DOSE 1 - oral 150 mg capsule - 1 capsule oral taken 3 times a day for 14 days JACKSON-Boesel, Sangita K. ([id number]) Electronic Signature(s) Signed: 02/23/2019 8:45:55 AM By: Worthy Keeler PA-C Entered By: Worthy Keeler on 02/23/2019 08:45:55 JACKSON-Sprenkle, Salma K. ([id number]) -------------------------------------------------------------------------------- Problem List Details Patient Name: JACKSON-Stotler, Meredeth K. Date of Service: 02/23/2019 8:00 AM Medical Record Number: [id number] Patient Account Number: [id number] Date of Birth/Sex: 30-Apr-1951 (68 y.o. F) Treating RN: Montey Hora Primary Care Provider: Tracie Harrier Other Clinician: Referring Provider: Tracie Harrier Treating Provider/Extender: Melburn Hake, HOYT Weeks in Treatment: 7 Active Problems ICD-10 Evaluated Encounter Code Description Active Date Today Diagnosis E11.9 Type 2 diabetes mellitus without complications 0/02/2724 No Yes L97.222 Non-pressure chronic ulcer of left calf with fat layer exposed 01/03/2019 No Yes L97.812 Non-pressure chronic ulcer of other part of right lower leg 02/02/2019 No Yes with fat layer exposed L02.416 Cutaneous abscess of left lower limb 02/15/2019 No Yes Inactive  Problems Resolved Problems Electronic Signature(s) Signed: 02/23/2019 4:32:36 PM  By: Worthy Keeler PA-C Entered By: Worthy Keeler on 02/23/2019 08:19:02 JACKSON-Wenzl, Sundy K. ([id number]) -------------------------------------------------------------------------------- Progress Note Details Patient Name: JACKSON-Pellerito, Derek K. Date of Service: 02/23/2019 8:00 AM Medical Record Number: [id number] Patient Account Number: [id number] Date of Birth/Sex: 1950-10-23 (68 y.o. F) Treating RN: Montey Hora Primary Care Provider: Tracie Harrier Other Clinician: Referring Provider: Tracie Harrier Treating Provider/Extender: Melburn Hake, HOYT Weeks in Treatment: 7 Subjective Chief Complaint Information obtained from Patient Left Leg ulcer x 2 weeks History of Present Illness (HPI) Pleasant 68 year old Caucasian female who fell down a few steps in her home 2 weeks ago and developed bruises on both legs on the shins, on the left shin area she did have an open area that scabbed, patient did ED on 8/1 at which point she had dressing applied to the left shin wound, she had a right shin hematoma that was lanced with oozing of blood after. Since that time patient has noticed that the left calf leg wound developed a black covering on top and had started to hurt, she has been dressing this daily with an antibiotic ointment and gauze wrap. 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 Since a week now she has been on Keflex 500 twice daily prescribed by her PCP Patient's history significant for type 2 diabetes, remote history of DVT for which she is on Coumadin, Also remote history of PE for which she has been on Coumadin indefinitely, essential hypertension, history of colon cancer, history of CHF that is compensated, history of back pain ABIs today in the clinic 1.04 on the right 1.07 on the left 01/10/2019; patient admitted to the  clinic last week. She had a traumatic wound on the left lower extremity. We have been using silver alginate because of drainage. She has been in compression 8/26; this was initially a traumatic wound on the left lower extremity. It has some depth superiorly appears to have come in this week. We have been using silver alginate because of drainage 01/24/19 once again the wrap seems to have come down and she took it off and put Vaseline on the wound on Sunday. She did not call the clinic. About 75% of this is fully granulated 25% is not and that is superiorly. We have been using silver alginate but all changed to silver collagen today. 02/01/19 on evaluation today patient appears to be doing okay in regard to her left anterior lower extremity ulcer. She also has a right lower leg ulcer which really has not been monitored as much at this point due to the fact that it appeared to be doing quite well according to what that [facility] felt. Nonetheless there appears to be some fluid that is leaking from the right side as well upon evaluation today. I think this may need to be addressed at this point unfortunately. I don't think it's just gonna heal on its own at least not very effectively. No fevers, chills, nausea, or vomiting noted at this time. 02/09/2019 today patient appears to be doing a little better in regard to her left lower extremity. On the right lower extremity she actually does have some necrotic tissue noted at this point. Both areas do seem to be draining quite a bit at this time. There is no sign of active infection at this point. 02/15/2019 on evaluation today patient unfortunately is having some issues today with a abscess on the medial aspect  of her left lower extremity which is new since last week. She states that this actually occurred on Friday she had a blister initially that she took her wrap off and tried to pop. Subsequently since that time it is gotten worse become more painful and  appears to be much more infected. There does not appear to be any signs of active systemic infection which is good news. No fever chills noted. She actually called into the office on Tuesday at which point I sent in a prescription for Keflex until she came in today for evaluation. Apparently she called yesterday as well asking what she can do about the pain and was told to take Tylenol as well. Currently she states that this unfortunately is exquisitely tender even to light touch. Unfortunately I do believe this is a much deeper abscess that is even apparent she has a lot of necrotic/hyper granular tissue noted on the surface of the wound. 02/23/2019 upon evaluation today patient actually appears to be doing slightly better compared to last evaluation. I did review her wound culture today which showed that she was positive for MRSA. This is sensitive to clindamycin and tetracycline orally JACKSON-Shawler, Trinita K. ([id number]) only. Fortunately there is no signs of active infection at this time systemically which is good news. No fevers, chills, nausea, vomiting, or diarrhea. Patient History Information obtained from Patient. Family History Diabetes - Siblings, No family history of Cancer, Heart Disease, Hereditary Spherocytosis, Hypertension, Kidney Disease, Lung Disease, Seizures, Stroke, Thyroid Problems, Tuberculosis. Social History Never smoker, Marital Status - Married, Alcohol Use - Never, Drug Use - No History, Caffeine Use - Daily. Medical History Respiratory Denies history of Aspiration, Asthma, Chronic Obstructive Pulmonary Disease (COPD), Pneumothorax, Sleep Apnea, Tuberculosis Cardiovascular Patient has history of Congestive Heart Failure, Deep Vein Thrombosis, Hypertension Denies history of Angina, Arrhythmia, Coronary Artery Disease, Hypotension, Myocardial Infarction, Peripheral Arterial Disease, Peripheral Venous Disease, Phlebitis, Vasculitis Endocrine Patient has history  of Type II Diabetes Denies history of Type I Diabetes Integumentary (Skin) Denies history of History of Burn, History of pressure wounds Musculoskeletal Patient has history of Osteoarthritis Denies history of Gout, Rheumatoid Arthritis, Osteomyelitis Neurologic Patient has history of Neuropathy Denies history of Dementia, Quadriplegia, Paraplegia, Seizure Disorder Medical And Surgical History Notes Respiratory hx pulmonary embolism Musculoskeletal OP Review of Systems (ROS) Constitutional Symptoms (General Health) Denies complaints or symptoms of Fatigue, Fever, Chills, Marked Weight Change. Respiratory Denies complaints or symptoms of Chronic or frequent coughs, Shortness of Breath. Cardiovascular Denies complaints or symptoms of Chest pain, LE edema. Neurologic Denies complaints or symptoms of Numbness/parasthesias, Focal/Weakness. JACKSON-Vernier, Latayvia K. ([id number]) Objective Constitutional Well-nourished and well-hydrated in no acute distress. Vitals Time Taken: 8:10 AM, Height: 63 in, Weight: 227 lbs, BMI: 40.2, Temperature: 98.0 F, Pulse: 70 bpm, Respiratory Rate: 18 breaths/min, Blood Pressure: 144/59 mmHg. Respiratory normal breathing without difficulty. clear to auscultation bilaterally. Cardiovascular regular rate and rhythm with normal S1, S2. Psychiatric this patient is able to make decisions and demonstrates good insight into disease process. Alert and Oriented x 3. pleasant and cooperative. General Notes: Patient's wound bed currently at all locations seems to be doing a little bit better but nonetheless still she is having some pain especially on the left medial lower extremity. She does have some slough noted but I did not perform any sharp debridement today as she still has an active infection I do not want to risk spreading anything and causing this to become worse. Integumentary (Hair, Skin) Wound #1 status is Open.  Original cause of wound was Trauma.  The wound is located on the Left,Anterior Lower Leg. The wound measures 1.7cm length x 0.6cm width x 0.6cm depth; 0.801cm^2 area and 0.481cm^3 volume. There is Fat Layer (Subcutaneous Tissue) Exposed exposed. There is no tunneling or undermining noted. There is a large amount of serosanguineous drainage noted. The wound margin is flat and intact. There is large (67-100%) pink, hyper - granulation within the wound bed. There is a small (1-33%) amount of necrotic tissue within the wound bed including Adherent Slough. Wound #2 status is Open. Original cause of wound was Gradually Appeared. The wound is located on the Right,Lateral Lower Leg. The wound measures 0.9cm length x 1.2cm width x 0.1cm depth; 0.848cm^2 area and 0.085cm^3 volume. There is Fat Layer (Subcutaneous Tissue) Exposed exposed. There is no tunneling or undermining noted. There is a large amount of serous drainage noted. The wound margin is distinct with the outline attached to the wound base. There is small (1-33%) pink granulation within the wound bed. There is a large (67-100%) amount of necrotic tissue within the wound bed including Adherent Slough. Wound #3 status is Open. Original cause of wound was Gradually Appeared. The wound is located on the Left,Medial,Posterior Lower Leg. The wound measures 1.5cm length x 1cm width x 0.1cm depth; 1.178cm^2 area and 0.118cm^3 volume. There is Fat Layer (Subcutaneous Tissue) Exposed exposed. There is no tunneling or undermining noted. There is a large amount of serosanguineous drainage noted. The wound margin is distinct with the outline attached to the wound base. There is large (67-100%) hyper - granulation within the wound bed. There is a small (1-33%) amount of necrotic tissue within the wound bed including Adherent Slough. Assessment Active Problems ICD-10 Type 2 diabetes mellitus without complications Non-pressure chronic ulcer of left calf with fat layer exposed Non-pressure  chronic ulcer of other part of right lower leg with fat layer exposed JACKSON-Wittmann, [name] ([id number]) Cutaneous abscess of left lower limb Plan Wound Cleansing: Wound #1 Left,Anterior Lower Leg: May shower with protection. Other: - wash head to toe with Hibiclense once daily for 1 week Wound #2 Right,Lateral Lower Leg: May shower with protection. Other: - wash head to toe with Hibiclense once daily for 1 week Wound #3 Left,Medial,Posterior Lower Leg: May shower with protection. Other: - wash head to toe with Hibiclense once daily for 1 week Anesthetic (add to Medication List): Wound #1 Left,Anterior Lower Leg: Topical Lidocaine 4% cream applied to wound bed prior to debridement (In Clinic Only). Wound #2 Right,Lateral Lower Leg: Topical Lidocaine 4% cream applied to wound bed prior to debridement (In Clinic Only). Wound #3 Left,Medial,Posterior Lower Leg: Topical Lidocaine 4% cream applied to wound bed prior to debridement (In Clinic Only). Primary Wound Dressing: Wound #1 Left,Anterior Lower Leg: Silver Collagen Wound #2 Right,Lateral Lower Leg: Silver Collagen Wound #3 Left,Medial,Posterior Lower Leg: Silver Alginate Secondary Dressing: Wound #1 Left,Anterior Lower Leg: ABD pad Wound #2 Right,Lateral Lower Leg: Boardered Foam Dressing Wound #3 Left,Medial,Posterior Lower Leg: ABD pad Dressing Change Frequency: Wound #1 Left,Anterior Lower Leg: Change dressing every other day. Wound #2 Right,Lateral Lower Leg: Change dressing every other day. Wound #3 Left,Medial,Posterior Lower Leg: Change dressing every other day. Follow-up Appointments: Wound #1 Left,Anterior Lower Leg: Return Appointment in 1 week. Nurse Visit as needed Wound #2 Right,Lateral Lower Leg: Return Appointment in 1 week. Nurse Visit as needed Wound #3 Left,Medial,Posterior Lower Leg: Return Appointment in 1 week. Nurse Visit as needed Edema Control: Wound #1 Left,Anterior Lower  Leg: JACKSON-Borthwick, Dajanique K. ([id number]) Elevate legs to the level of the heart and pump ankles as often as possible Other: - TubiGrip F bilateral Wound #2 Right,Lateral Lower Leg: Elevate legs to the level of the heart and pump ankles as often as possible Other: - TubiGrip F bilateral Wound #3 Left,Medial,Posterior Lower Leg: Elevate legs to the level of the heart and pump ankles as often as possible Other: - TubiGrip F bilateral Medications-please add to medication list.: Topical Antibiotic - mupirocin - use every night, put a small amount on the tips of your fingers and in each nostril The following medication(s) was prescribed: clindamycin HCl oral 150 mg capsule 1 1 capsule oral taken 3 times a day for 14 days starting 02/23/2019 1. I would recommend currently that we go ahead and place the patient on continued regimen of collagen to the 2 original wounds and alginate to the new wound which seems to have done well for her. 2. We will continue to treat her with antibiotics although I am going to go ahead and place her on clindamycin as this does not interact with her Coumadin and subsequently should help with treating the infection based on the culture profile. The doxycycline which could have been used interact with her Coumadin. 3. I am going to suggest that we continue to cover the areas with a border foam dressing at this point. She will also continue to use the Tubigrip bilaterally. 4. 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 This will be done 1 time a day for each in the first week and then subsequently she will do this 1 time a week following. We will see patient back for reevaluation in 1 week here in the clinic. If anything worsens or changes patient will contact our office for additional  recommendations. Electronic Signature(s) Signed: 02/23/2019 8:47:04 AM By: Worthy Keeler PA-C Entered By: Worthy Keeler on 02/23/2019 08:47:04 JACKSON-Allington, Alyssa K. ([id number]) -------------------------------------------------------------------------------- ROS/PFSH Details Patient Name: JACKSON-Drost, Brinnley K. Date of Service: 02/23/2019 8:00 AM Medical Record Number: [id number] Patient Account Number: [id number] Date of Birth/Sex: 08-17-1950 (68 y.o. F) Treating RN: Montey Hora Primary Care Provider: Tracie Harrier Other Clinician: Referring Provider: Tracie Harrier Treating Provider/Extender: Melburn Hake, HOYT Weeks in Treatment: 7 Information Obtained From Patient Constitutional Symptoms (General Health) Complaints and Symptoms: Negative for: Fatigue; Fever; Chills; Marked Weight Change Respiratory Complaints and Symptoms: Negative for: Chronic or frequent coughs; Shortness of Breath Medical History: Negative for: Aspiration; Asthma; Chronic Obstructive Pulmonary Disease (COPD); Pneumothorax; Sleep Apnea; Tuberculosis Past Medical History Notes: hx pulmonary embolism Cardiovascular Complaints and Symptoms: Negative for: Chest pain; LE edema Medical History: Positive for: Congestive Heart Failure; Deep Vein Thrombosis; Hypertension Negative for: Angina; Arrhythmia; Coronary Artery Disease; Hypotension; Myocardial Infarction; Peripheral Arterial Disease; Peripheral Venous Disease; Phlebitis; Vasculitis Neurologic Complaints and Symptoms: Negative for: Numbness/parasthesias; Focal/Weakness Medical History: Positive for: Neuropathy Negative for: Dementia; Quadriplegia; Paraplegia; Seizure Disorder Endocrine Medical History: Positive for: Type II Diabetes Negative for: Type I Diabetes Time with diabetes: 5 years Treated with: Oral agents Blood sugar tested every day: Yes Tested : QD Integumentary (Skin) JACKSON-Waltner, [name] ([id number]) Medical  History: Negative for: History of Burn; History of pressure wounds Musculoskeletal Medical History: Positive for: Osteoarthritis Negative for: Gout; Rheumatoid Arthritis; Osteomyelitis Past Medical History Notes: OP Immunizations Pneumococcal Vaccine: Received  Pneumococcal Vaccination: Yes Implantable Devices None Family and Social History Cancer: No; Diabetes: Yes - Siblings; Heart Disease: No; Hereditary Spherocytosis: No; Hypertension: No; Kidney Disease: No; Lung Disease: No; Seizures: No; Stroke: No; Thyroid Problems: No; Tuberculosis: No; Never smoker; Marital Status - Married; Alcohol Use: Never; Drug Use: No History; Caffeine Use: Daily; Financial Concerns: No; Food, Clothing or Shelter Needs: No; Support System Lacking: No; Transportation Concerns: No Physician Affirmation I have reviewed and agree with the above information. Electronic Signature(s) Signed: 02/23/2019 4:32:36 PM By: Worthy Keeler PA-C Signed: 02/23/2019 4:39:44 PM By: Montey Hora Entered By: Worthy Keeler on 02/23/2019 08:43:21 JACKSON-Rostro, Sindi K. ([id number]) -------------------------------------------------------------------------------- SuperBill Details Patient Name: JACKSON-Skluzacek, [name] Date of Service: 02/23/2019 Medical Record Number: [id number] Patient Account Number: [id number] Date of Birth/Sex: 12/01/1950 (68 y.o. 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 L02.416 Cutaneous abscess of left lower limb Facility Procedures CPT4 Code: [id number] Description: 99214 - WOUND  CARE VISIT-LEV 4 EST PT Modifier: Quantity: 1 Physician Procedures CPT4 Code Description: [id number] 99214 - WC PHYS LEVEL 4 - EST PT ICD-10 Diagnosis Description E11.9 Type 2 diabetes mellitus without complications 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

## 2019-02-23 NOTE — Progress Notes (Signed)
ABRIANNA, SIDMAN ([id number]) Visit Report for 02/23/2019 Arrival Information Details Patient Name: TITTERINGTON, Pallas K. Date of Service: 02/23/2019 8:00 AM Medical Record Number: [id number] Patient Account Number: [id number] Date of Birth/Sex: 08-06-1950 (68 y.o. F) Treating RN: Harold Barban Primary Care Chrisie Jankovich: Tracie Harrier Other Clinician: Referring Bryanna Yim: Tracie Harrier Treating Sharetha Newson/Extender: Melburn Hake, HOYT Weeks in Treatment: 7 Visit Information History Since Last Visit Added or deleted any medications: No Patient Arrived: Ambulatory Any new allergies or adverse reactions: No Arrival Time: 08:11 Had a fall or experienced change in No Accompanied By: self activities of daily living that may affect Transfer Assistance: None risk of falls: Patient Identification Verified: Yes Signs or symptoms of abuse/neglect since last visito No Secondary Verification Process Yes Has Dressing in Place as Prescribed: Yes Completed: Pain Present Now: No Patient Has Alerts: Yes Patient Alerts: Patient on Blood Thinner Warfarin DMII Electronic Signature(s) Signed: 02/23/2019 4:43:08 PM By: Harold Barban Entered By: Harold Barban on 02/23/2019 08:13:00 [place], Amna K. ([id number]) -------------------------------------------------------------------------------- Clinic Level of Care Assessment Details Patient Name: JACKSON-Bolda, Torryn K. Date of Service: 02/23/2019 8:00 AM Medical Record Number: [id number] Patient Account Number: [id number] Date of Birth/Sex: 08/08/1950 (68 y.o. F) Treating RN: Montey Hora Primary Care Luceil Herrin: Tracie Harrier Other Clinician: Referring Grae Leathers: Tracie Harrier Treating Yarden Manuelito/Extender: Melburn Hake, HOYT Weeks in Treatment: 7 Clinic Level of Care Assessment Items TOOL 4 Quantity Score []  - Use when only an EandM is performed on FOLLOW-UP visit 0 ASSESSMENTS - Nursing Assessment / Reassessment X -  Reassessment of Co-morbidities (includes updates in patient status) 1 10 X- 1 5 Reassessment of Adherence to Treatment Plan ASSESSMENTS - Wound and Skin Assessment / Reassessment []  - Simple Wound Assessment / Reassessment - one wound 0 X- 3 5 Complex Wound Assessment / Reassessment - multiple wounds []  - 0 Dermatologic / Skin Assessment (not related to wound area) ASSESSMENTS - Focused Assessment []  - Circumferential Edema Measurements - multi extremities 0 []  - 0 Nutritional Assessment / Counseling / Intervention X- 1 5 Lower Extremity Assessment (monofilament, tuning fork, pulses) []  - 0 Peripheral Arterial Disease Assessment (using hand held doppler) ASSESSMENTS - Ostomy and/or Continence Assessment and Care []  - Incontinence Assessment and Management 0 []  - 0 Ostomy Care Assessment and Management (repouching, etc.) PROCESS - Coordination of Care X - Simple Patient / Family Education for ongoing care 1 15 []  - 0 Complex (extensive) Patient / Family Education for ongoing care X- 1 10 Staff obtains Programmer, systems, Records, Test Results / Process Orders []  - 0 Staff telephones HHA, Nursing Homes / Clarify orders / etc []  - 0 Routine Transfer to another Facility (non-emergent condition) []  - 0 Routine Hospital Admission (non-emergent condition) []  - 0 New Admissions / Biomedical engineer / Ordering NPWT, Apligraf, etc. []  - 0 Emergency Hospital Admission (emergent condition) X- 1 10 Simple Discharge Coordination JACKSON-Turvey, Hartleigh K. ([id number]) []  - 0 Complex (extensive) Discharge Coordination PROCESS - Special Needs []  - Pediatric / Minor Patient Management 0 []  - 0 Isolation Patient Management []  - 0 Hearing / Language / Visual special needs []  - 0 Assessment of Community assistance (transportation, D/C planning, etc.) []  - 0 Additional assistance / Altered mentation []  - 0 Support Surface(s) Assessment (bed, cushion, seat, etc.) INTERVENTIONS - Wound  Cleansing / Measurement []  - Simple Wound Cleansing - one wound 0 X- 3 5 Complex Wound Cleansing - multiple wounds X- 1 5 Wound Imaging (photographs - any number of wounds) []  - 0 Wound Tracing (instead of photographs) []  -  0 Simple Wound Measurement - one wound X- 3 5 Complex Wound Measurement - multiple wounds INTERVENTIONS - Wound Dressings X - Small Wound Dressing one or multiple wounds 3 10 []  - 0 Medium Wound Dressing one or multiple wounds []  - 0 Large Wound Dressing one or multiple wounds []  - 0 Application of Medications - topical []  - 0 Application of Medications - injection INTERVENTIONS - Miscellaneous []  - External ear exam 0 []  - 0 Specimen Collection (cultures, biopsies, blood, body fluids, etc.) []  - 0 Specimen(s) / Culture(s) sent or taken to Lab for analysis []  - 0 Patient Transfer (multiple staff / Civil Service fast streamer / Similar devices) []  - 0 Simple Staple / Suture removal (25 or less) []  - 0 Complex Staple / Suture removal (26 or more) []  - 0 Hypo / Hyperglycemic Management (close monitor of Blood Glucose) []  - 0 Ankle / Brachial Index (ABI) - do not check if billed separately X- 1 5 Vital Signs JACKSON-Lafave, Luv K. ([id number]) Has the patient been seen at the hospital within the last three years: Yes Total Score: 140 Level Of Care: New/Established - Level 4 Electronic Signature(s) Signed: 02/23/2019 4:39:44 PM By: Montey Hora Entered By: Montey Hora on 02/23/2019 08:42:57 [place], Stasha K. ([id number]) -------------------------------------------------------------------------------- Encounter Discharge Information Details Patient Name: JACKSON-Stober, Briana K. Date of Service: 02/23/2019 8:00 AM Medical Record Number: [id number] Patient Account Number: [id number] Date of Birth/Sex: [birth year] (68 y.o. F) Treating RN: Montey Hora Primary Care Storie Heffern: Tracie Harrier Other Clinician: Referring Cheril Slattery: Tracie Harrier Treating Korina Tretter/Extender: Melburn Hake, HOYT Weeks in Treatment: 7 Encounter Discharge Information Items Discharge Condition: Stable Ambulatory Status: Ambulatory Discharge Destination: Home Transportation: Private Auto Accompanied By: self Schedule Follow-up Appointment: Yes Clinical Summary of Care: Electronic Signature(s) Signed: 02/23/2019 4:39:44 PM By: Montey Hora Entered By: Montey Hora on 02/23/2019 08:44:23 JACKSON-Grimmett, Janace K. ([id number]) -------------------------------------------------------------------------------- Lower Extremity Assessment Details Patient Name: JACKSON-Fregia, Audyn K. Date of Service: 02/23/2019 8:00 AM Medical Record Number: [id number] Patient Account Number: [id number] Date of Birth/Sex: 03-Oct-1950 (68 y.o. F) Treating RN: Harold Barban Primary Care Chyrl Elwell: Tracie Harrier Other Clinician: Referring Chenika Nevils: Tracie Harrier Treating Shandon Matson/Extender: Melburn Hake, HOYT Weeks in Treatment: 7 Vascular Assessment Pulses: Dorsalis Pedis Palpable: [Left:Yes] [Right:Yes] Posterior Tibial Palpable: [Left:Yes] [Right:Yes] Electronic Signature(s) Signed: 02/23/2019 4:43:08 PM By: Harold Barban Entered By: Harold Barban on 02/23/2019 08:23:41 JACKSON-Lynde, Marisella K. ([id number]) -------------------------------------------------------------------------------- Multi Wound Chart Details Patient Name: JACKSON-Larzelere, Saamiya K. Date of Service: 02/23/2019 8:00 AM Medical Record Number: [id number] Patient Account Number: [id number] Date of Birth/Sex: 1950/06/07 (68 y.o. F) Treating RN: Montey Hora Primary Care Florestine Carmical: Tracie Harrier Other Clinician: Referring Zaeda Mcferran: Tracie Harrier Treating Declan Mier/Extender: Melburn Hake, HOYT Weeks in Treatment: 7 Vital Signs Height(in): 63 Pulse(bpm): 70 Weight(lbs): 227 Blood Pressure(mmHg): 144/59 Body Mass Index(BMI): 40 Temperature(F): 98.0 Respiratory  Rate 18 (breaths/min): Photos: Wound Location: Left Lower Leg - Anterior Right Lower Leg - Lateral Left Lower Leg - Medial, Posterior Wounding Event: Trauma Gradually Appeared Gradually Appeared Primary Etiology: Diabetic Wound/Ulcer of the Diabetic Wound/Ulcer of the Diabetic Wound/Ulcer of the Lower Extremity Lower Extremity Lower Extremity Secondary Etiology: Trauma, Other N/A N/A Comorbid History: Congestive Heart Failure, Congestive Heart Failure, Congestive Heart Failure, Deep Vein Thrombosis, Deep Vein Thrombosis, Deep Vein Thrombosis, Hypertension, Type II Hypertension, Type II Hypertension, Type II Diabetes, Osteoarthritis, Diabetes, Osteoarthritis, Diabetes, Osteoarthritis, Neuropathy Neuropathy Neuropathy Date Acquired: 12/21/2018 01/30/2019 02/09/2019 Weeks of Treatment: 7 3 1  Wound Status: Open Open Open Measurements L x W x D 1.7x0.6x0.6 0.9x1.2x0.1 1.5x1x0.1 (cm) Area (  cm) : 0.801 0.848 1.178 Volume (cm) : 0.481 0.085 0.118 % Reduction in Area: 85.10% -332.70% 28.60% % Reduction in Volume: 87.20% -325.00% 28.50% Classification: Grade 1 Grade 1 Grade 2 Exudate Amount: Large Large Large Exudate Type: Serosanguineous Serous Serosanguineous Exudate Color: red, brown amber red, brown Wound Margin: Flat and Intact Distinct, outline attached Distinct, outline attached Granulation Amount: Large (67-100%) Small (1-33%) Large (67-100%) Granulation Quality: Pink, Hyper-granulation Pink Hyper-granulation Necrotic Amount: Small (1-33%) Large (67-100%) Small (1-33%) Exposed Structures: JACKSON-Escue, Safira K. ([id number]) Fat Layer (Subcutaneous Fat Layer (Subcutaneous Fat Layer (Subcutaneous Tissue) Exposed: Yes Tissue) Exposed: Yes Tissue) Exposed: Yes Fascia: No Fascia: No Fascia: No Tendon: No Tendon: No Tendon: No Muscle: No Muscle: No Muscle: No Joint: No Joint: No Joint: No Bone: No Bone: No Bone: No Epithelialization: None None None Treatment  Notes Electronic Signature(s) Signed: 02/23/2019 8:31:49 AM By: Montey Hora Entered By: Montey Hora on 02/23/2019 08:31:48 JACKSON-Broxson, Retina K. ([id number]) -------------------------------------------------------------------------------- Multi-Disciplinary Care Plan Details Patient Name: JACKSON-Jasmer, Rosy K. Date of Service: 02/23/2019 8:00 AM Medical Record Number: [id number] Patient Account Number: [id number] Date of Birth/Sex: 08-21-1950 (68 y.o. F) Treating RN: Montey Hora Primary Care Daymon Hora: Tracie Harrier Other Clinician: Referring Anthonee Gelin: Tracie Harrier Treating Keenen Roessner/Extender: Melburn Hake, HOYT Weeks in Treatment: 7 Active Inactive Abuse / Safety / Falls / Self Care Management Nursing Diagnoses: History of Falls Goals: Patient will not experience any injury related to falls Date Initiated: 01/03/2019 Target Resolution Date: 01/17/2019 Goal Status: Active Interventions: Assess fall risk on admission and as needed Notes: Medication Nursing Diagnoses: Knowledge deficit related to medication safety: actual or potential Goals: Patient/caregiver will demonstrate understanding of all current medications Date Initiated: 01/03/2019 Target Resolution Date: 01/03/2019 Goal Status: Active Interventions: Patient/Caregiver given reconciled medication list upon admission, changes in medications and discharge from the [facility] Notes: Necrotic Tissue Nursing Diagnoses: Impaired tissue integrity related to necrotic/devitalized tissue Goals: Necrotic/devitalized tissue will be minimized in the wound bed Date Initiated: 01/03/2019 Target Resolution Date: 01/10/2019 Goal Status: Active Interventions: JACKSON-Scarola, Jamecia K. ([id number]) Assess patient pain level pre-, during and post procedure and prior to discharge Treatment Activities: Apply topical anesthetic as ordered : 01/03/2019 Excisional debridement : 01/03/2019 Notes: Wound/Skin  Impairment Nursing Diagnoses: Impaired tissue integrity Goals: Ulcer/skin breakdown will have a volume reduction of 30% by week 4 Date Initiated: 01/03/2019 Target Resolution Date: 02/03/2019 Goal Status: Active Interventions: Assess patient/caregiver ability to obtain necessary supplies Assess ulceration(s) every visit Treatment Activities: Skin care regimen initiated : 01/03/2019 Topical wound management initiated : 01/03/2019 Notes: Electronic Signature(s) Signed: 02/23/2019 8:31:39 AM By: Montey Hora Entered By: Montey Hora on 02/23/2019 08:31:39 JACKSON-Dommer, Danaya K. ([id number]) -------------------------------------------------------------------------------- Pain Assessment Details Patient Name: JACKSON-Totzke, Zineb K. Date of Service: 02/23/2019 8:00 AM Medical Record Number: [id number] Patient Account Number: [id number] Date of Birth/Sex: Jun 14, 1950 (68 y.o. F) Treating RN: Harold Barban Primary Care Tynisha Ogan: Tracie Harrier Other Clinician: Referring Alireza Pollack: Tracie Harrier Treating Tali Coster/Extender: Melburn Hake, HOYT Weeks in Treatment: 7 Active Problems Location of Pain Severity and Description of Pain Patient Has Paino No Site Locations Pain Management and Medication Current Pain Management: Electronic Signature(s) Signed: 02/23/2019 4:43:08 PM By: Harold Barban Entered By: Harold Barban on 02/23/2019 08:13:07 JACKSON-Krogh, Timia K. ([id number]) -------------------------------------------------------------------------------- Patient/Caregiver Education Details Patient Name: JACKSON-Gemmill, Brittne K. Date of Service: 02/23/2019 8:00 AM Medical Record Number: [id number] Patient Account Number: [id number] Date of Birth/Gender: 1951-02-26 (68 y.o. F) Treating RN: Montey Hora Primary Care Physician: Tracie Harrier Other Clinician: Referring Physician: Tracie Harrier Treating Physician/Extender: Melburn Hake,  HOYT Weeks in Treatment:  7 Education Assessment Education Provided To: Patient Education Topics Provided Wound/Skin Impairment: Handouts: Other: wound care as ordered Methods: Demonstration, Explain/Verbal Responses: State content correctly Electronic Signature(s) Signed: 02/23/2019 4:39:44 PM By: Montey Hora Entered By: Montey Hora on 02/23/2019 08:43:18 JACKSON-Lorenzi, Dezire K. ([id number]) -------------------------------------------------------------------------------- Wound Assessment Details Patient Name: JACKSON-Banfill, Janaye K. Date of Service: 02/23/2019 8:00 AM Medical Record Number: [id number] Patient Account Number: [id number] Date of Birth/Sex: Apr 22, 1951 (68 y.o. F) Treating RN: Harold Barban Primary Care Chester Sibert: Tracie Harrier Other Clinician: Referring Alece Koppel: Tracie Harrier Treating Lindwood Mogel/Extender: STONE III, HOYT Weeks in Treatment: 7 Wound Status Wound Number: 1 Primary Diabetic Wound/Ulcer of the Lower Extremity Etiology: Wound Location: Left Lower Leg - Anterior Secondary Trauma, Other Wounding Event: Trauma Etiology: Date Acquired: 12/21/2018 Wound Open Weeks Of Treatment: 7 Status: Clustered Wound: No Comorbid Congestive Heart Failure, Deep Vein History: Thrombosis, Hypertension, Type II Diabetes, Osteoarthritis, Neuropathy Photos Wound Measurements Length: (cm) 1.7 Width: (cm) 0.6 Depth: (cm) 0.6 Area: (cm) 0.801 Volume: (cm) 0.481 % Reduction in Area: 85.1% % Reduction in Volume: 87.2% Epithelialization: None Tunneling: No Undermining: No Wound Description Classification: Grade 1 Foul Odor Wound Margin: Flat and Intact Slough/Fib Exudate Amount: Large Exudate Type: Serosanguineous Exudate Color: red, brown After Cleansing: No rino Yes Wound Bed Granulation Amount: Large (67-100%) Exposed Structure Granulation Quality: Pink, Hyper-granulation Fascia Exposed: No Necrotic Amount: Small (1-33%) Fat Layer (Subcutaneous Tissue) Exposed:  Yes Necrotic Quality: Adherent Slough Tendon Exposed: No Muscle Exposed: No Joint Exposed: No Bone Exposed: No JACKSON-Sannes, Rosene K. ([id number]) Treatment Notes Wound #1 (Left, Anterior Lower Leg) Notes prisma BFD to the right, ABD and conform to the left, tubigrip F bilateral - #1 and 2 prisma, #3 silvercel Electronic Signature(s) Signed: 02/23/2019 4:43:08 PM By: Harold Barban Entered By: Harold Barban on 02/23/2019 08:22:44 JACKSON-Frater, Kitara K. ([id number]) -------------------------------------------------------------------------------- Wound Assessment Details Patient Name: JACKSON-Romain, Arlyss K. Date of Service: 02/23/2019 8:00 AM Medical Record Number: [id number] Patient Account Number: [id number] Date of Birth/Sex: 1950/09/19 (68 y.o. F) Treating RN: Harold Barban Primary Care Zedekiah Hinderman: Tracie Harrier Other Clinician: Referring Casen Pryor: Tracie Harrier Treating Markia Kyer/Extender: STONE III, HOYT Weeks in Treatment: 7 Wound Status Wound Number: 2 Primary Diabetic Wound/Ulcer of the Lower Extremity Etiology: Wound Location: Right Lower Leg - Lateral Wound Open Wounding Event: Gradually Appeared Status: Date Acquired: 01/30/2019 Comorbid Congestive Heart Failure, Deep Vein Weeks Of Treatment: 3 History: Thrombosis, Hypertension, Type II Diabetes, Clustered Wound: No Osteoarthritis, Neuropathy Photos Wound Measurements Length: (cm) 0.9 Width: (cm) 1.2 Depth: (cm) 0.1 Area: (cm) 0.848 Volume: (cm) 0.085 % Reduction in Area: -332.7% % Reduction in Volume: -325% Epithelialization: None Tunneling: No Undermining: No Wound Description Classification: Grade 1 Foul Odor A Wound Margin: Distinct, outline attached Slough/Fibr Exudate Amount: Large Exudate Type: Serous Exudate Color: amber fter Cleansing: No ino Yes Wound Bed Granulation Amount: Small (1-33%) Exposed Structure Granulation Quality: Pink Fascia Exposed: No Necrotic Amount:  Large (67-100%) Fat Layer (Subcutaneous Tissue) Exposed: Yes Necrotic Quality: Adherent Slough Tendon Exposed: No Muscle Exposed: No Joint Exposed: No Bone Exposed: No Treatment Notes JACKSON-Antonelli, Elanna K. ([id number]) Wound #2 (Right, Lateral Lower Leg) Notes prisma BFD to the right, ABD and conform to the left, tubigrip F bilateral - #1 and 2 prisma, #3 silvercel Electronic Signature(s) Signed: 02/23/2019 4:43:08 PM By: Harold Barban Entered By: Harold Barban on 02/23/2019 08:21:30 JACKSON-Yeagle, Rotunda K. ([id number]) -------------------------------------------------------------------------------- Wound Assessment Details Patient Name: JACKSON-Baray, Jhane K. Date of Service: 02/23/2019 8:00 AM Medical Record Number: [id number] Patient Account Number:  [id number] Date of Birth/Sex: 25-Feb-1951 (68 y.o. F) Treating RN: Harold Barban Primary Care Inaara Tye: Tracie Harrier Other Clinician: Referring Annalyn Blecher: Tracie Harrier Treating Dedrick Heffner/Extender: STONE III, HOYT Weeks in Treatment: 7 Wound Status Wound Number: 3 Primary Diabetic Wound/Ulcer of the Lower Extremity Etiology: Wound Location: Left Lower Leg - Medial, Posterior Wound Open Wounding Event: Gradually Appeared Status: Date Acquired: 02/09/2019 Comorbid Congestive Heart Failure, Deep Vein Weeks Of Treatment: 1 History: Thrombosis, Hypertension, Type II Diabetes, Clustered Wound: No Osteoarthritis, Neuropathy Photos Wound Measurements Length: (cm) 1.5 Width: (cm) 1 Depth: (cm) 0.1 Area: (cm) 1.178 Volume: (cm) 0.118 % Reduction in Area: 28.6% % Reduction in Volume: 28.5% Epithelialization: None Tunneling: No Undermining: No Wound Description Classification: Grade 2 Wound Margin: Distinct, outline attached Exudate Amount: Large Exudate Type: Serosanguineous Exudate Color: red, brown Foul Odor After Cleansing: No Slough/Fibrino Yes Wound Bed Granulation Amount: Large (67-100%) Exposed  Structure Granulation Quality: Hyper-granulation Fascia Exposed: No Necrotic Amount: Small (1-33%) Fat Layer (Subcutaneous Tissue) Exposed: Yes Necrotic Quality: Adherent Slough Tendon Exposed: No Muscle Exposed: No Joint Exposed: No Bone Exposed: No Treatment Notes JACKSON-Probasco, Briella K. ([id number]) Wound #3 (Left, Medial, Posterior Lower Leg) Notes prisma BFD to the right, ABD and conform to the left, tubigrip F bilateral - #1 and 2 prisma, #3 silvercel Electronic Signature(s) Signed: 02/23/2019 4:43:08 PM By: Harold Barban Entered By: Harold Barban on 02/23/2019 08:22:07 JACKSON-Bin, Shawna K. ([id number]) -------------------------------------------------------------------------------- Vitals Details Patient Name: JACKSON-Mix, Hermina K. Date of Service: 02/23/2019 8:00 AM Medical Record Number: [id number] Patient Account Number: [id number] Date of Birth/Sex: September 11, 1950 (68 y.o. F) Treating RN: Harold Barban Primary Care Malachi Kinzler: Tracie Harrier Other Clinician: Referring Emilia Kayes: Tracie Harrier Treating Anishka Bushard/Extender: Melburn Hake, HOYT Weeks in Treatment: 7 Vital Signs Time Taken: 08:10 Temperature (F): 98.0 Height (in): 63 Pulse (bpm): 70 Weight (lbs): 227 Respiratory Rate (breaths/min): 18 Body Mass Index (BMI): 40.2 Blood Pressure (mmHg): 144/59 Reference Range: 80 - 120 mg / dl Electronic Signature(s) Signed: 02/23/2019 4:43:08 PM By: Harold Barban Entered By: Harold Barban on 02/23/2019 08:14:21

## 2019-02-26 DIAGNOSIS — M5136 Other intervertebral disc degeneration, lumbar region: Secondary | ICD-10-CM | POA: Diagnosis not present

## 2019-02-26 DIAGNOSIS — Z6841 Body Mass Index (BMI) 40.0 and over, adult: Secondary | ICD-10-CM | POA: Diagnosis not present

## 2019-02-26 DIAGNOSIS — Z86711 Personal history of pulmonary embolism: Secondary | ICD-10-CM | POA: Diagnosis not present

## 2019-02-26 DIAGNOSIS — G2581 Restless legs syndrome: Secondary | ICD-10-CM | POA: Diagnosis not present

## 2019-02-26 DIAGNOSIS — I10 Essential (primary) hypertension: Secondary | ICD-10-CM | POA: Diagnosis not present

## 2019-02-26 DIAGNOSIS — E1165 Type 2 diabetes mellitus with hyperglycemia: Secondary | ICD-10-CM | POA: Diagnosis not present

## 2019-02-26 DIAGNOSIS — Z23 Encounter for immunization: Secondary | ICD-10-CM | POA: Diagnosis not present

## 2019-02-27 ENCOUNTER — Encounter: Payer: Self-pay | Admitting: Pain Medicine

## 2019-02-28 ENCOUNTER — Other Ambulatory Visit: Payer: Self-pay

## 2019-02-28 ENCOUNTER — Ambulatory Visit: Payer: PPO | Attending: Pain Medicine | Admitting: Pain Medicine

## 2019-02-28 DIAGNOSIS — M545 Low back pain, unspecified: Secondary | ICD-10-CM

## 2019-02-28 DIAGNOSIS — G8929 Other chronic pain: Secondary | ICD-10-CM | POA: Diagnosis not present

## 2019-02-28 DIAGNOSIS — Z7901 Long term (current) use of anticoagulants: Secondary | ICD-10-CM

## 2019-02-28 DIAGNOSIS — M25552 Pain in left hip: Secondary | ICD-10-CM

## 2019-02-28 DIAGNOSIS — M533 Sacrococcygeal disorders, not elsewhere classified: Secondary | ICD-10-CM | POA: Diagnosis not present

## 2019-02-28 DIAGNOSIS — M8448XS Pathological fracture, other site, sequela: Secondary | ICD-10-CM

## 2019-02-28 NOTE — Progress Notes (Signed)
Pain Management Virtual Encounter Note - Virtual Visit via Telephone Telehealth (real-time audio visits between healthcare provider and patient).   Patient's Phone No. & Preferred Pharmacy:  [phone] (home); [phone] (mobile); (Preferred) [phone] [email or web address]  [place], [place] [address] Phone: [phone] Fax: [phone]    Pre-screening note:  Our staff contacted Kara [name] and offered her an "in person", "face-to-face" appointment versus a telephone encounter. She indicated preferring the telephone encounter, at this time.   Reason for Virtual Visit: COVID-19*  Social distancing based on CDC and AMA recommendations.   I contacted Kara [name] on 02/28/2019 via telephone.      I clearly identified myself as Gaspar Cola, MD. I verified that I was speaking with the correct person using two identifiers (Name: Kara [name], and date of birth: 28-Apr-1951).  Advanced Informed Consent I sought verbal advanced consent from Kara [name] for virtual visit interactions. I informed Kara [name] of possible security and privacy concerns, risks, and limitations associated with providing "not-in-person" medical evaluation and management services. I also informed Kara [name] of the availability of "in-person" appointments. Finally, I informed her that there would be a charge for the virtual visit and that she could be  personally, fully or partially, financially responsible for it. Kara [name] expressed understanding and agreed to proceed.   Historic Elements   Kara [name] is a 68 y.o. year old, female patient evaluated today after her last encounter by our practice on 02/09/2019. 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 She also  has a past surgical history that includes Knee surgery; Carpal tunnel release (Bilateral); Colon resection sigmoid (N/A, 11/02/2016); Colostomy (N/A, 11/02/2016); Sigmoidoscopy (N/A, 11/13/2016); PULMONARY VENOGRAPHY (N/A, 11/19/2016); Colonoscopy with propofol (N/A, 02/03/2017); Colon surgery (11/02/2016); IVC FILTER INSERTION (Right, 10/2016); Colostomy reversal (N/A, 02/24/2017); Appendectomy (N/A, 02/24/2017); Lysis of adhesion (N/A, 02/24/2017); laparoscopy (N/A, 02/24/2017); Ileo loop diversion (N/A, 02/24/2017); Sigmoidoscopy (N/A, 05/19/2017); Ileostomy closure (N/A, 06/01/2017); IVC FILTER REMOVAL (N/A, 08/09/2017); Sacroplasty (N/A, 11/08/2017); Cataract extraction w/PHACO (Left, 11/21/2018); and Cataract extraction w/PHACO (Right, 12/12/2018). Kara [name] has a current medication list which includes the following prescription(s): aspirin ec, calcium polycarbophil, candesartan, cyclobenzaprine, gabapentin, glipizide, hydrocodone-acetaminophen, meclizine, ropinirole, warfarin, warfarin, and multi-vitamins. She  reports that she has never smoked. She has never used smokeless tobacco. She reports that she does not drink alcohol or use drugs. Kara [name] is allergic to adhesive [tape]; other; latex; and morphine and related.   HPI  Today, she is being contacted for a post-procedure assessment.  Post-Procedure Evaluation  Procedure: Diagnostic bilateral lumbar facet block #R8/L3 under fluoroscopic guidance, no sedation Pre-procedure pain level:  9/10 Post-procedure: 0/10 (100% relief)  Sedation: Please see nurses note.  Effectiveness during initial hour after procedure(Ultra-Short Term Relief): 100 %   Local anesthetic used: Long-acting (4-6 hours) Effectiveness: Defined as any analgesic benefit obtained secondary to the  administration of local anesthetics. This carries significant diagnostic value as to the etiological location, or anatomical origin, of the pain. Duration of benefit is expected to coincide with the duration of the local anesthetic used.  Effectiveness during initial 4-6 hours after procedure(Short-Term Relief): 100 %   Long-term benefit: Defined as any relief past the pharmacologic duration of the local  anesthetics.  Effectiveness past the initial 6 hours after procedure(Long-Term Relief): 50 %   Current benefits: Defined as benefit that persist at this time.   Analgesia:  Back to baseline Function: Back to baseline ROM: Back to baseline  Pharmacotherapy Assessment  Analgesic: Hydrocodone/APAP 5/325, 1 tab PO QD PRN (5 mg/day of hydrocodone) (#60 was lasting her 4-5 mo. Last filled on 02/08/2019) as of today 02/28/2019, he still has 25 of the 30 pills left.  She indicates not needing a refill at this time. MME: 5 mg/day.   Monitoring: Pharmacotherapy: No side-effects or adverse reactions reported. [place] PMP: PDMP reviewed during this encounter.       Compliance: No problems identified. Effectiveness: Clinically acceptable. Plan: Refer to "POC".  UDS: No results found for: SUMMARY Laboratory Chemistry Profile (12 mo)  Renal: 12/23/2018: BUN 12; Creatinine, Ser 0.81  Lab Results  Component Value Date   GFRAA >60 12/23/2018   GFRNONAA >60 12/23/2018   Hepatic: 12/23/2018: Albumin 3.5 Lab Results  Component Value Date   AST 30 12/23/2018   ALT 36 12/23/2018   Other: No results found for requested labs within last 8760 hours. Note: Above Lab results reviewed.  Imaging  Last 90 days:  Dg Tibia/fibula Left  Result Date: 12/23/2018 CLINICAL DATA:  Bruises bilateral lower legs post fall. EXAM: LEFT TIBIA AND FIBULA - 2 VIEW COMPARISON:  None. FINDINGS: There is no evidence of fracture or dislocation. Soft tissues are unremarkable. IMPRESSION: No acute fracture or dislocation. Electronically  Signed   By: Abelardo Diesel M.D.   On: 12/23/2018 13:51   Dg Tibia/fibula Right  Result Date: 12/23/2018 CLINICAL DATA:  Status post fall with bruises in the lower legs EXAM: RIGHT TIBIA AND FIBULA - 2 VIEW COMPARISON:  None. FINDINGS: There is no evidence of fracture or dislocation. Tibial spine osteophytosis is noted. Soft tissues are unremarkable. IMPRESSION: No acute fracture or dislocation. Electronically Signed   By: Abelardo Diesel M.D.   On: 12/23/2018 13:50   Dg Pain Clinic C-arm 1-60 Min No Report  Result Date: 02/08/2019 Fluoro was used, but no Radiologist interpretation will be provided. Please refer to "NOTES" tab for provider progress note.  Dg Pain Clinic C-arm 1-60 Min No Report  Result Date: 12/26/2018 Fluoro was used, but no Radiologist interpretation will be provided. Please refer to "NOTES" tab for provider progress note.   Assessment  The primary encounter diagnosis was Chronic hip pain (Left). Diagnoses of Chronic sacroiliac joint pain (Left), Chronic low back pain (Primary Area of Pain) (Bilateral), Sacral insufficiency fracture, and Chronic anticoagulation (COUMADIN) were also pertinent to this visit.  Plan of Care  I am having Kara [name] maintain her aspirin EC, cyclobenzaprine, Multi-Vitamins, glipiZIDE, candesartan, gabapentin, meclizine, rOPINIRole, warfarin, warfarin, Calcium Polycarbophil (FIBER-CAPS PO), and HYDROcodone-acetaminophen.  Pharmacotherapy (Medications Ordered): No orders of the defined types were placed in this encounter.  Orders:  Orders Placed This Encounter  Procedures  . SACROILIAC JOINT INJECTION    Standing Status:   Future    Standing Expiration Date:   03/31/2019    Scheduling Instructions:     Side: Left-sided     Sedation: No Sedation.     Timeframe: ASAA    Order Specific Question:   Where will this procedure be performed?    Answer:   [name]   Follow-up plan:   Return for Procedure (no sedation): (L)  SI BLK #1, (Blood-thinner Protocol).      Interventional management options:  Considering:  NOTE:COUMADINANTICOAGULATION(Stop:5days  Re-start: 2hrs) NO RFAuntil BMIis <35. Diagnostic L5-S1 LESI Diagnostic bilateral L3TFESI Diagnostic bilateral L4TFESI Diagnostic bilateral L5 TFESI Diagnostic left SI joint block #1  Possible righ SI joint RFA Possible bilateral lumbar facet RFA(NO RFAuntil BMIis <35.) Diagnostic right IA hip joint injection Diagnostic right femoral +ObturatorNB Possible right femoral + obturator nerve RFA   Palliative PRN treatment(s):   Palliative/Therapeutic left L4-5 LESI #2 Diagnostic left lumbar facet block#4 Palliativeright lumbar facet block(s) #8 Palliative right SI joint block #4 Palliative/Therapeutic (Midline) caudalESI#4    Recent Visits Date Type Provider Dept  02/08/19 Procedure visit Milinda Pointer, MD [name]  02/05/19 Office Visit Milinda Pointer, MD [name]  12/26/18 Procedure visit Milinda Pointer, MD [name]  Showing recent visits within past 90 days and meeting all other requirements   Today's Visits Date Type Provider Dept  02/28/19 Office Visit Milinda Pointer, MD [name]  Showing today's visits and meeting all other requirements   Future Appointments No visits were found meeting these conditions.  Showing future appointments within next 90 days and meeting all other requirements   I discussed the assessment and treatment plan with the patient. The patient was provided an opportunity to ask questions and all were answered. The patient agreed with the plan and demonstrated an understanding of the instructions.  Patient advised to call back or seek an in-person evaluation if the symptoms or condition worsens.  Total duration of non-face-to-face encounter: 15 minutes.  Note by: Gaspar Cola, MD Date: 02/28/2019; Time: 2:49  PM  Note: This dictation was prepared with Dragon dictation. Any transcriptional errors that may result from this process are unintentional.  Disclaimer:  * Given the special circumstances of the COVID-19 pandemic, the federal government has announced that the Office for Civil Rights (OCR) will exercise its enforcement discretion and will not impose penalties on physicians using telehealth in the event of noncompliance with regulatory requirements under the [place] (HIPAA) in connection with the good faith provision of telehealth during the XQJJH-41 national public health emergency. ([place])

## 2019-02-28 NOTE — Patient Instructions (Signed)
____________________________________________________________________________________________  Preparing for your procedure (without sedation)  Procedure appointments are limited to planned procedures: . No Prescription Refills. . No disability issues will be discussed. . No medication changes will be discussed.  Instructions: . Oral Intake: Do not eat or drink anything for at least 3 hours prior to your procedure. . Transportation: Unless otherwise stated by your physician, you may drive yourself after the procedure. . Blood Pressure Medicine: Take your blood pressure medicine with a sip of water the morning of the procedure. . Blood thinners: Notify our staff if you are taking any blood thinners. Depending on which one you take, there will be specific instructions on how and when to stop it. . Diabetics on insulin: Notify the staff so that you can be scheduled 1st case in the morning. If your diabetes requires high dose insulin, take only  of your normal insulin dose the morning of the procedure and notify the staff that you have done so. . Preventing infections: Shower with an antibacterial soap the morning of your procedure.  . Build-up your immune system: Take 1000 mg of Vitamin C with every meal (3 times a day) the day prior to your procedure. . Antibiotics: Inform the staff if you have a condition or reason that requires you to take antibiotics before dental procedures. . Pregnancy: If you are pregnant, call and cancel the procedure. . Sickness: If you have a cold, fever, or any active infections, call and cancel the procedure. . Arrival: You must be in the facility at least 30 minutes prior to your scheduled procedure. . Children: Do not bring any children with you. . Dress appropriately: Bring dark clothing that you would not mind if they get stained. . Valuables: Do not bring any jewelry or valuables.  Reasons to call and reschedule or cancel your procedure: (Following these  recommendations will minimize the risk of a serious complication.) . Surgeries: Avoid having procedures within 2 weeks of any surgery. (Avoid for 2 weeks before or after any surgery). . Flu Shots: Avoid having procedures within 2 weeks of a flu shots or . (Avoid for 2 weeks before or after immunizations). . Barium: Avoid having a procedure within 7-10 days after having had a radiological study involving the use of radiological contrast. (Myelograms, Barium swallow or enema study). . Heart attacks: Avoid any elective procedures or surgeries for the initial 6 months after a "Myocardial Infarction" (Heart Attack). . Blood thinners: It is imperative that you stop these medications before procedures. Let us know if you if you take any blood thinner.  . Infection: Avoid procedures during or within two weeks of an infection (including chest colds or gastrointestinal problems). Symptoms associated with infections include: Localized redness, fever, chills, night sweats or profuse sweating, burning sensation when voiding, cough, congestion, stuffiness, runny nose, sore throat, diarrhea, nausea, vomiting, cold or Flu symptoms, recent or current infections. It is specially important if the infection is over the area that we intend to treat. . Heart and lung problems: Symptoms that may suggest an active cardiopulmonary problem include: cough, chest pain, breathing difficulties or shortness of breath, dizziness, ankle swelling, uncontrolled high or unusually low blood pressure, and/or palpitations. If you are experiencing any of these symptoms, cancel your procedure and contact your primary care physician for an evaluation.  Remember:  Regular Business hours are:  Monday to Thursday 8:00 AM to 4:00 PM  Provider's Schedule: Kinsler Soeder, MD:  Procedure days: Tuesday and Thursday 7:30 AM to 4:00 PM  Bilal   Lateef, MD:  Procedure days: Monday and Wednesday 7:30 AM to 4:00  PM ____________________________________________________________________________________________    

## 2019-03-01 ENCOUNTER — Other Ambulatory Visit: Payer: Self-pay

## 2019-03-01 ENCOUNTER — Encounter: Payer: PPO | Admitting: Physician Assistant

## 2019-03-01 DIAGNOSIS — L97812 Non-pressure chronic ulcer of other part of right lower leg with fat layer exposed: Secondary | ICD-10-CM | POA: Diagnosis not present

## 2019-03-01 DIAGNOSIS — S81802A Unspecified open wound, left lower leg, initial encounter: Secondary | ICD-10-CM | POA: Diagnosis not present

## 2019-03-01 DIAGNOSIS — L97822 Non-pressure chronic ulcer of other part of left lower leg with fat layer exposed: Secondary | ICD-10-CM | POA: Diagnosis not present

## 2019-03-01 DIAGNOSIS — L97222 Non-pressure chronic ulcer of left calf with fat layer exposed: Secondary | ICD-10-CM | POA: Diagnosis not present

## 2019-03-01 NOTE — Progress Notes (Addendum)
Kara [name] ([id number]) Visit Report for 03/01/2019 Chief Complaint Document Details Patient Name: [name], [name] Date of Service: 03/01/2019 9:00 AM Medical Record Number: [id number] Patient Account Number: [id number] Date of Birth/Sex: 03/03/1951 (68 y.o. F) Treating RN: Army Melia Primary Care Provider: Tracie Harrier Other Clinician: Referring Provider: Tracie Harrier Treating Provider/Extender: Melburn Hake, Elois Averitt Weeks in Treatment: 8 Information Obtained from: Patient Chief Complaint Left Leg ulcer x 2 weeks Electronic Signature(s) Signed: 03/01/2019 8:56:50 AM By: Worthy Keeler PA-C Entered By: Worthy Keeler on 03/01/2019 08:56:50 [name], [name] ([id number]) -------------------------------------------------------------------------------- HPI Details Patient Name: [name], [name] Date of Service: 03/01/2019 9:00 AM Medical Record Number: [id number] Patient Account Number: [id number] Date of Birth/Sex: [birth year] (68 y.o. F) Treating RN: Army Melia Primary Care Provider: Tracie Harrier Other Clinician: Referring Provider: Tracie Harrier Treating Provider/Extender: Melburn Hake, Jakera Beaupre Weeks in Treatment: 8 History of Present Illness HPI Description: Pleasant 68 year old Caucasian female who fell down a few steps in her home 2 weeks ago and developed bruises on both legs on the shins, on the left shin area she did have an open area that scabbed, patient did ED on 8/1 at which point she had dressing applied to the left shin wound, she had a right shin hematoma that was lanced with oozing of blood after. Since that time patient has noticed that the left calf leg wound developed a black covering on top and had started to hurt, she has been dressing this daily with an antibiotic ointment and gauze wrap. 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 She had a traumatic wound on the left lower extremity. We have been using silver alginate because of drainage. She has been in compression 8/26; this was initially a traumatic wound on the left lower extremity. It has some depth superiorly appears to have come in this week. We have been using silver alginate because of drainage 01/24/19 once again the wrap seems to have come down and she took it off and put Vaseline on the wound on Sunday. She did not call the clinic. About 75% of this is fully granulated 25% is not and that is superiorly. We have been using silver alginate but all changed to silver collagen today. 02/01/19 on evaluation today patient appears to be doing okay in regard to her left anterior lower extremity ulcer. She also has a right lower leg ulcer which really has not been monitored as much at this point due to the fact that it appeared to be doing quite well according to what that [facility] felt. Nonetheless there appears to be some fluid that is leaking from the right side as well upon evaluation today. I think this may need to be addressed at this point unfortunately. I don't think it's just gonna heal on its own at least not very effectively. No fevers, chills, nausea, or vomiting noted at this time. 02/09/2019 today patient appears to be doing  a little better in regard to her left lower extremity. On the right lower extremity she actually does have some necrotic tissue noted at this point. Both areas do seem to be draining quite a bit  at this time. There is no sign of active infection at this point. 02/15/2019 on evaluation today patient unfortunately is having some issues today with a abscess on the medial aspect of her left lower extremity which is new since last week. She states that this actually occurred on Friday she had a blister initially that she took her wrap off and tried to pop. Subsequently since that time it is gotten worse become more painful and appears to be much more infected. There does not appear to be any signs of active systemic infection which is good news. No fever chills noted. She actually called into the office on Tuesday at which point I sent in a prescription for Keflex until she came in today for evaluation. Apparently she called yesterday as well asking what she can do about the pain and was told to take Tylenol as well. Currently she states that this unfortunately is exquisitely tender even to light touch. Unfortunately I do believe this is a much deeper abscess that is even apparent she has a lot of necrotic/hyper granular tissue noted on the surface of the wound. 02/23/2019 upon evaluation today patient actually appears to be doing slightly better compared to last evaluation. I did review her wound culture today which showed that she was positive for MRSA. This is sensitive to clindamycin and tetracycline orally only. Fortunately there is no signs of active infection at this time systemically which is good news. No fevers, chills, nausea, vomiting, or diarrhea. 03/01/2019 on evaluation today patient appears to be doing well with regard to her lower extremity wounds. Her infection seems to be under much better control than previously noted which is excellent news. With that being said I still feel like that [place], [place]. ([id number]) though she is doing a lot better she still has a little better time to go as far as getting these wounds to completely close. Fortunately there is no signs  of active infection at this time. No fever chills noted. Electronic Signature(s) Signed: 03/01/2019 11:48:26 AM By: Worthy Keeler PA-C Entered By: Worthy Keeler on 03/01/2019 11:48:26 [name], [name] ([id number]) -------------------------------------------------------------------------------- Physical Exam Details Patient Name: JACKSON-Rigdon, Kara [name] K. Date of Service: 03/01/2019 9:00 AM Medical Record Number: [id number] Patient Account Number: [id number] Date of Birth/Sex: [birth year] (68 y.o. F) Treating RN: Army Melia Primary Care Provider: Tracie Harrier Other Clinician: Referring Provider: Tracie Harrier Treating Provider/Extender: STONE III, Andrienne Havener Weeks in Treatment: 8 Constitutional Well-nourished and well-hydrated in no acute distress. Respiratory normal breathing without difficulty. clear to auscultation bilaterally. Cardiovascular regular rate and rhythm with normal S1, S2. Psychiatric this patient is able to make decisions and demonstrates good insight into disease process. Alert and Oriented x 3. pleasant and cooperative. Notes Upon inspection patient's wound bed showed signs of good granulation at this time. Fortunately there does not appear to be any evidence of infection in any slough that was noted on the surface of the wounds I was able to gently debride away with saline and gauze she tolerated that with some discomfort but nothing significant. Overall I feel like we are probably at the point to switch to a collagen dressing at all locations. Electronic Signature(s) Signed: 03/01/2019 11:48:57 AM By: Worthy Keeler PA-C Entered By: Worthy Keeler on 03/01/2019 11:48:57  JACKSON-Fregia, Konica K. ([id number]) -------------------------------------------------------------------------------- Physician Orders Details Patient Name: JACKSON-Haydu, Lamae K. Date of Service: 03/01/2019 9:00 AM Medical Record Number: [id number] Patient Account Number:  [id number] Date of Birth/Sex: Jan 07, 1951 (68 y.o. 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 o Other: - wash head to toe with Hibiclense once daily for 1 week Wound #2 Right,Lateral Lower Leg o May shower with protection. o Other: - wash head to toe with Hibiclense once daily for 1 week Wound #3 Left,Medial,Posterior Lower Leg o May shower with protection. o Other: - wash head to toe with Hibiclense once daily for 1 week Anesthetic (add to Medication List) Wound #1 Left,Anterior Lower Leg o Topical Lidocaine 4% cream applied to wound bed prior to debridement (In Clinic Only). Wound #2 Right,Lateral Lower Leg o Topical Lidocaine 4% cream applied to wound bed prior to debridement (In Clinic Only). Wound #3 Left,Medial,Posterior Lower Leg o Topical Lidocaine 4% cream applied to wound bed prior to debridement (In Clinic Only). Primary Wound Dressing Wound #1 Left,Anterior Lower Leg o Silver Collagen Wound #2 Right,Lateral Lower Leg o Silver Collagen Wound #3 Left,Medial,Posterior Lower Leg o Silver Collagen Secondary Dressing Wound #1 Left,Anterior Lower Leg o ABD and Kerlix/Conform [name], [name] ([id number]) Wound #3 Left,Medial,Posterior Lower Leg o ABD and Kerlix/Conform Wound #2 Right,Lateral Lower Leg o Boardered Foam Dressing Dressing Change Frequency Wound #1 Left,Anterior  Lower Leg o Change dressing every other day. Wound #2 Right,Lateral Lower Leg o Change dressing every other day. Wound #3 Left,Medial,Posterior Lower Leg o Change dressing every other day. Follow-up Appointments Wound #1 Left,Anterior Lower Leg o Return Appointment in 1 week. o Nurse Visit as needed Wound #2 Right,Lateral Lower Leg o Return Appointment in 1 week. o Nurse Visit as needed Wound #3 Left,Medial,Posterior Lower Leg o Return Appointment in 1 week. o Nurse Visit as needed Edema Control Wound #1 Left,Anterior Lower Leg o Elevate legs to the level of the heart and pump ankles as often as possible o Other: - TubiGrip F bilateral Wound #2 Right,Lateral Lower Leg o Elevate legs to the level of the heart and pump ankles as often as possible o Other: - TubiGrip F bilateral Wound #3 Left,Medial,Posterior Lower Leg o Elevate legs to the level of the heart and pump ankles as often as possible o Other: - TubiGrip F bilateral Medications-please add to medication list. o Topical Antibiotic - mupirocin - use every night, put a small amount on the tips of your fingers and in each nostril Electronic Signature(s) Signed: 03/01/2019 5:11:53 PM By: Worthy Keeler PA-C Signed: 03/12/2019 8:11:06 AM By: Army Melia Entered By: Army Melia on 03/01/2019 09:40:15 JACKSON-Yonke, Genevie K. ([id number]) -------------------------------------------------------------------------------- Problem List Details Patient Name: [name], [name] Date of Service: 03/01/2019 9:00 AM Medical Record Number: [id number] Patient Account Number: [id number] Date of Birth/Sex: 05-29-1950 (68 y.o. F) Treating RN: Army Melia Primary Care Provider: Tracie Harrier Other Clinician: Referring Provider: Tracie Harrier Treating Provider/Extender: Melburn Hake, Miliyah Luper Weeks in Treatment: 8 Active Problems ICD-10 Evaluated Encounter Code Description Active Date Today  Diagnosis E11.9 Type 2 diabetes mellitus without complications 07/13/2540 No Yes L97.222 Non-pressure chronic ulcer of left calf with fat layer exposed 01/03/2019 No Yes L97.812 Non-pressure chronic ulcer of other part of right lower leg 02/02/2019 No Yes with fat layer exposed L02.416 Cutaneous abscess of left lower limb 02/15/2019 No Yes Inactive Problems Resolved Problems Electronic Signature(s) Signed: 03/01/2019 8:56:41 AM By: Worthy Keeler PA-C Entered By: Worthy Keeler on 03/01/2019 08:56:41 JACKSON-Aiken, Chava K. ([id number]) -------------------------------------------------------------------------------- Progress Note Details Patient Name: [name], [name] Date of Service: 03/01/2019 9:00 AM Medical Record Number: [id number] Patient Account Number: [id number] Date of Birth/Sex: May 05, 1951 (68 y.o. F) Treating RN: Army Melia Primary Care Provider: Tracie Harrier Other Clinician: Referring Provider: Tracie Harrier Treating Provider/Extender: Melburn Hake, Ginnette Gates Weeks in Treatment: 8 Subjective Chief Complaint Information obtained from Patient Left Leg ulcer x 2 weeks History of Present Illness (HPI) Pleasant 68 year old Caucasian female who fell down a few steps in her home 2 weeks ago and developed bruises on both legs on the shins, on the left shin area she did have an open area that scabbed, patient did ED on 8/1 at which point she had dressing applied to the left shin wound, she had a right shin hematoma that was lanced with oozing of blood after. Since that time patient has noticed that the left calf leg wound developed a black covering on top and had started to hurt, she has been dressing this daily with an antibiotic ointment and gauze wrap. 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 She had a traumatic wound on the left lower extremity. We have been using silver alginate because of drainage. She has been in compression 8/26; this was initially a traumatic wound on the left lower extremity. It has some depth superiorly appears to have come in this week. We have been using silver alginate because of drainage 01/24/19 once again the wrap seems to have come down and she took it off and put Vaseline on the wound on Sunday. She did not call the clinic. About 75% of this is fully granulated 25% is not and that is superiorly. We have been using silver alginate but all changed to silver collagen today. 02/01/19 on evaluation today patient appears to be doing okay in regard to her left anterior lower extremity ulcer. She also has a right lower leg ulcer which really has not been monitored as much at this point due to the fact that it appeared to be doing quite well according to what that [facility] felt. Nonetheless there appears to be some fluid that is leaking from the right side as well upon evaluation today. I think this may need to be addressed at this point unfortunately. I don't think it's just gonna heal on its own at least not very effectively. No fevers, chills, nausea, or vomiting noted at this time. 02/09/2019 today patient appears to be doing a little better in  regard to her left lower extremity. On the right lower extremity she actually does have some necrotic tissue noted at this point. Both areas do seem to be draining quite a bit at this time. There is no sign of active infection at this point. 02/15/2019 on  evaluation today patient unfortunately is having some issues today with a abscess on the medial aspect of her left lower extremity which is new since last week. She states that this actually occurred on Friday she had a blister initially that she took her wrap off and tried to pop. Subsequently since that time it is gotten worse become more painful and appears to be much more infected. There does not appear to be any signs of active systemic infection which is good news. No fever chills noted. She actually called into the office on Tuesday at which point I sent in a prescription for Keflex until she came in today for evaluation. Apparently she called yesterday as well asking what she can do about the pain and was told to take Tylenol as well. Currently she states that this unfortunately is exquisitely tender even to light touch. Unfortunately I do believe this is a much deeper abscess that is even apparent she has a lot of necrotic/hyper granular tissue noted on the surface of the wound. 02/23/2019 upon evaluation today patient actually appears to be doing slightly better compared to last evaluation. I did review her wound culture today which showed that she was positive for MRSA. This is sensitive to clindamycin and tetracycline orally [name], [name] ([id number]) only. Fortunately there is no signs of active infection at this time systemically which is good news. No fevers, chills, nausea, vomiting, or diarrhea. 03/01/2019 on evaluation today patient appears to be doing well with regard to her lower extremity wounds. Her infection seems to be under much better control than previously noted which is excellent news. With that being said I still feel like that though she is doing a lot better she still has a little better time to go as far as getting these wounds to completely close. Fortunately there is no signs of active infection at this time. No fever chills noted. Patient  History Information obtained from Patient. Family History Diabetes - Siblings, No family history of Cancer, Heart Disease, Hereditary Spherocytosis, Hypertension, Kidney Disease, Lung Disease, Seizures, Stroke, Thyroid Problems, Tuberculosis. Social History Never smoker, Marital Status - Married, Alcohol Use - Never, Drug Use - No History, Caffeine Use - Daily. Medical History Respiratory Denies history of Aspiration, Asthma, Chronic Obstructive Pulmonary Disease (COPD), Pneumothorax, Sleep Apnea, Tuberculosis Cardiovascular Patient has history of Congestive Heart Failure, Deep Vein Thrombosis, Hypertension Denies history of Angina, Arrhythmia, Coronary Artery Disease, Hypotension, Myocardial Infarction, Peripheral Arterial Disease, Peripheral Venous Disease, Phlebitis, Vasculitis Endocrine Patient has history of Type II Diabetes Denies history of Type I Diabetes Integumentary (Skin) Denies history of History of Burn, History of pressure wounds Musculoskeletal Patient has history of Osteoarthritis Denies history of Gout, Rheumatoid Arthritis, Osteomyelitis Neurologic Patient has history of Neuropathy Denies history of Dementia, Quadriplegia, Paraplegia, Seizure Disorder Medical And Surgical History Notes Respiratory hx pulmonary embolism Musculoskeletal OP Review of Systems (ROS) Constitutional Symptoms (General Health) Denies complaints or symptoms of Fatigue, Fever, Chills, Marked Weight Change. Respiratory Denies complaints or symptoms of Chronic or frequent coughs, Shortness of Breath. Cardiovascular Complains or has symptoms of LE edema. Denies complaints or symptoms of Chest pain. Psychiatric Denies complaints or symptoms of Anxiety, Claustrophobia. [name], [name] ([id number]) Objective Constitutional Well-nourished  and well-hydrated in no acute distress. Vitals Time Taken: 9:20 AM, Height: 63 in, Weight: 227 lbs, BMI: 40.2, Temperature: 97.6 F, Pulse:  78 bpm, Respiratory Rate: 16 breaths/min, Blood Pressure: 144/62 mmHg. Respiratory normal breathing without difficulty. clear to auscultation bilaterally. Cardiovascular regular rate and rhythm with normal S1, S2. Psychiatric this patient is able to make decisions and demonstrates good insight into disease process. Alert and Oriented x 3. pleasant and cooperative. General Notes: Upon inspection patient's wound bed showed signs of good granulation at this time. Fortunately there does not appear to be any evidence of infection in any slough that was noted on the surface of the wounds I was able to gently debride away with saline and gauze she tolerated that with some discomfort but nothing significant. Overall I feel like we are probably at the point to switch to a collagen dressing at all locations. Integumentary (Hair, Skin) Wound #1 status is Open. Original cause of wound was Trauma. The wound is located on the Left,Anterior Lower Leg. The wound measures 1.2cm length x 0.3cm width x 0.3cm depth; 0.283cm^2 area and 0.085cm^3 volume. There is Fat Layer (Subcutaneous Tissue) Exposed exposed. There is no tunneling or undermining noted. There is a large amount of serosanguineous drainage noted. The wound margin is flat and intact. There is large (67-100%) pink, hyper - granulation within the wound bed. There is a small (1-33%) amount of necrotic tissue within the wound bed including Adherent Slough. Wound #2 status is Open. Original cause of wound was Gradually Appeared. The wound is located on the Right,Lateral Lower Leg. The wound measures 0.5cm length x 0.9cm width x 0.1cm depth; 0.353cm^2 area and 0.035cm^3 volume. There is Fat Layer (Subcutaneous Tissue) Exposed exposed. There is no tunneling or undermining noted. There is a large amount of serous drainage noted. The wound margin is distinct with the outline attached to the wound base. There is small (1-33%) pink granulation within the wound  bed. There is a large (67-100%) amount of necrotic tissue within the wound bed including Adherent Slough. Wound #3 status is Open. Original cause of wound was Gradually Appeared. The wound is located on the Left,Medial,Posterior Lower Leg. The wound measures 2cm length x 1cm width x 0.1cm depth; 1.571cm^2 area and 0.157cm^3 volume. There is Fat Layer (Subcutaneous Tissue) Exposed exposed. There is no tunneling or undermining noted. There is a large amount of serosanguineous drainage noted. The wound margin is distinct with the outline attached to the wound base. There is large (67-100%) hyper - granulation within the wound bed. There is a small (1-33%) amount of necrotic tissue within the wound bed including Adherent Slough. Assessment JACKSON-Mccarrell, Alexiya K. 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 Other: - wash head to toe with Hibiclense once daily for 1 week Wound #2 Right,Lateral Lower Leg: May shower with protection. Other: - wash head to toe with Hibiclense once daily for 1 week Wound #3 Left,Medial,Posterior Lower Leg: May shower with protection. Other: - wash head to toe with Hibiclense once daily for 1 week Anesthetic (add to Medication List): Wound #1 Left,Anterior Lower Leg: Topical Lidocaine 4% cream applied to wound bed prior to debridement (In Clinic Only). Wound #2 Right,Lateral Lower Leg: Topical Lidocaine 4% cream applied to  wound bed prior to debridement (In Clinic Only). Wound #3 Left,Medial,Posterior Lower Leg: Topical Lidocaine 4% cream applied to wound bed prior to debridement (In Clinic Only). Primary Wound Dressing: Wound #1 Left,Anterior Lower  Leg: Silver Collagen Wound #2 Right,Lateral Lower Leg: Silver Collagen Wound #3 Left,Medial,Posterior Lower Leg: Silver Collagen Secondary Dressing: Wound #1 Left,Anterior Lower Leg: ABD and Kerlix/Conform Wound #3 Left,Medial,Posterior Lower Leg: ABD and Kerlix/Conform Wound #2 Right,Lateral Lower Leg: Boardered Foam Dressing Dressing Change Frequency: Wound #1 Left,Anterior Lower Leg: Change dressing every other day. Wound #2 Right,Lateral Lower Leg: Change dressing every other day. Wound #3 Left,Medial,Posterior Lower Leg: Change dressing every other day. Follow-up Appointments: Wound #1 Left,Anterior Lower Leg: Return Appointment in 1 week. Nurse Visit as needed Wound #2 Right,Lateral Lower Leg: Return Appointment in 1 week. JACKSON-Hamil, Aleeyah KMarland Kitchen ([id number]) Nurse Visit as needed Wound #3 Left,Medial,Posterior Lower Leg: Return Appointment in 1 week. Nurse Visit as needed Edema Control: Wound #1 Left,Anterior Lower Leg: Elevate legs to the level of the heart and pump ankles as often as possible Other: - TubiGrip F bilateral Wound #2 Right,Lateral Lower Leg: Elevate legs to the level of the heart and pump ankles as often as possible Other: - TubiGrip F bilateral Wound #3 Left,Medial,Posterior Lower Leg: Elevate legs to the level of the heart and pump ankles as often as possible Other: - TubiGrip F bilateral Medications-please add to medication list.: Topical Antibiotic - mupirocin - use every night, put a small amount on the tips of your fingers and in each nostril 1. I am going to recommend that we go ahead and switch to a silver collagen dressing to all wound locations. The patient is in agreement that plan. 2. I am also going to suggest that we continue with the bordered foam dressings which do seem to be doing well for her as well. 3. I do recommend that the patient continue with the Tubigrip which seems to be the most comfortable thing for her and  then subsequently this also allows her to be able to bathe I do want her to wash her legs including the wounds with the Hibiclens on a regular basis to prevent infection. She is also can continue with the mupirocin in her nose and under her fingernails as directed. We will see patient back for reevaluation in 1 week here in the clinic. If anything worsens or changes patient will contact our office for additional recommendations. Electronic Signature(s) Signed: 03/01/2019 11:49:45 AM By: Worthy Keeler PA-C Entered By: Worthy Keeler on 03/01/2019 11:49:45 JACKSON-Uplinger, Cresencia K. ([id number]) -------------------------------------------------------------------------------- ROS/PFSH Details Patient Name: JACKSON-Friel, Camil K. Date of Service: 03/01/2019 9:00 AM Medical Record Number: [id number] Patient Account Number: [id number] Date of Birth/Sex: [birth year] (68 y.o. F) Treating RN: Army Melia Primary Care Provider: Tracie Harrier Other Clinician: Referring Provider: Tracie Harrier Treating Provider/Extender: Melburn Hake, Waverley Krempasky Weeks in Treatment: 8 Information Obtained From Patient Constitutional Symptoms (General Health) Complaints and Symptoms: Negative for: Fatigue; Fever; Chills; Marked Weight Change Respiratory Complaints and Symptoms: Negative for: Chronic or frequent coughs; Shortness of Breath Medical History: Negative for: Aspiration; Asthma; Chronic Obstructive Pulmonary Disease (COPD); Pneumothorax; Sleep Apnea; Tuberculosis Past Medical History Notes: hx pulmonary embolism Cardiovascular Complaints and Symptoms: Positive for: LE edema Negative for: Chest pain Medical History: Positive for: Congestive Heart Failure; Deep Vein Thrombosis; Hypertension Negative for: Angina; Arrhythmia; Coronary Artery Disease; Hypotension; Myocardial Infarction; Peripheral Arterial Disease; Peripheral Venous Disease; Phlebitis; Vasculitis Psychiatric Complaints and  Symptoms: Negative for: Anxiety; Claustrophobia Endocrine Medical History: Positive  for: Type II Diabetes Negative for: Type I Diabetes Time with diabetes: 5 years Treated with: Oral agents Blood sugar tested every day: Yes Tested : QD Integumentary (Skin) Medical History: Negative for: History of Burn; History of pressure wounds JACKSON-Brigante, Quida K. ([id number]) Musculoskeletal Medical History: Positive for: Osteoarthritis Negative for: Gout; Rheumatoid Arthritis; Osteomyelitis Past Medical History Notes: OP Neurologic Medical History: Positive for: Neuropathy Negative for: Dementia; Quadriplegia; Paraplegia; Seizure Disorder Immunizations Pneumococcal Vaccine: Received Pneumococcal Vaccination: Yes Implantable Devices None Family and Social History Cancer: No; Diabetes: Yes - Siblings; Heart Disease: No; Hereditary Spherocytosis: No; Hypertension: No; Kidney Disease: No; Lung Disease: No; Seizures: No; Stroke: No; Thyroid Problems: No; Tuberculosis: No; Never smoker; Marital Status - Married; Alcohol Use: Never; Drug Use: No History; Caffeine Use: Daily; Financial Concerns: No; Food, Clothing or Shelter Needs: No; Support System Lacking: No; Transportation Concerns: No Physician Affirmation I have reviewed and agree with the above information. Electronic Signature(s) Signed: 03/01/2019 5:11:53 PM By: Worthy Keeler PA-C Signed: 03/12/2019 8:11:06 AM By: Army Melia Entered By: Worthy Keeler on 03/01/2019 11:48:44 JACKSON-Haik, Eshika K. ([id number]) -------------------------------------------------------------------------------- SuperBill Details Patient Name: JACKSON-Abernethy, Jayelyn K. Date of Service: 03/01/2019 Medical Record Number: [id number] Patient Account Number: [id number] Date of Birth/Sex: February 20, 1951 (68 y.o. 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 L02.416 Cutaneous abscess of left lower limb Facility Procedures CPT4 Code: [id number] Description: 99214 - WOUND CARE VISIT-LEV 4 EST PT Modifier: Quantity: 1 Physician Procedures CPT4 Code Description: [id number] 99214 - WC PHYS LEVEL 4 - EST PT ICD-10 Diagnosis Description E11.9 Type 2 diabetes mellitus without complications V78.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

## 2019-03-01 NOTE — Progress Notes (Addendum)
MADYSON, LUKACH ([id number]) Visit Report for 03/01/2019 Arrival Information Details Patient Name: Kara [name], [name] Date of Service: 03/01/2019 9:00 AM Medical Record Number: [id number] Patient Account Number: [id number] Date of Birth/Sex: [birth year] (68 y.o. F) Treating RN: Cornell Barman Primary Care Luva Metzger: Tracie Harrier Other Clinician: Referring Letti Towell: Tracie Harrier Treating John Vasconcelos/Extender: Melburn Hake, HOYT Weeks in Treatment: 8 Visit Information History Since Last Visit Added or deleted any medications: No Patient Arrived: Cane Any new allergies or adverse reactions: No Arrival Time: 09:18 Had a fall or experienced change in No Accompanied By: self activities of daily living that may affect Transfer Assistance: None risk of falls: Patient Identification Verified: Yes Signs or symptoms of abuse/neglect since last visito No Secondary Verification Process Yes Hospitalized since last visit: No Completed: Implantable device outside of the clinic excluding No Patient Has Alerts: Yes cellular tissue based products placed in the center Patient Alerts: Patient on Blood since last visit: Thinner Pain Present Now: No Warfarin DMII Electronic Signature(s) Signed: 03/01/2019 4:55:32 PM By: Gretta Cool, BSN, RN, CWS, Kim RN, BSN Entered By: Gretta Cool, BSN, RN, CWS, Kim on 03/01/2019 09:19:16 Fritch, Derby. ([id number]) -------------------------------------------------------------------------------- Clinic Level of Care Assessment Details Patient Name: Kara [name], [name] Date of Service: 03/01/2019 9:00 AM Medical Record Number: [id number] Patient Account Number: [id number] Date of Birth/Sex: 1950/10/26 (68 y.o. F) Treating RN: Army Melia Primary Care Jameelah Watts: Tracie Harrier Other Clinician: Referring Nithya Meriweather: Tracie Harrier Treating Jamey Demchak/Extender: Melburn Hake, HOYT Weeks in Treatment: 8 Clinic Level of Care Assessment Items TOOL 4  Quantity Score []  - Use when only an EandM is performed on FOLLOW-UP visit 0 ASSESSMENTS - Nursing Assessment / Reassessment X - Reassessment of Co-morbidities (includes updates in patient status) 1 10 X- 1 5 Reassessment of Adherence to Treatment Plan ASSESSMENTS - Wound and Skin Assessment / Reassessment []  - Simple Wound Assessment / Reassessment - one wound 0 X- 3 5 Complex Wound Assessment / Reassessment - multiple wounds []  - 0 Dermatologic / Skin Assessment (not related to wound area) ASSESSMENTS - Focused Assessment []  - Circumferential Edema Measurements - multi extremities 0 []  - 0 Nutritional Assessment / Counseling / Intervention []  - 0 Lower Extremity Assessment (monofilament, tuning fork, pulses) []  - 0 Peripheral Arterial Disease Assessment (using hand held doppler) ASSESSMENTS - Ostomy and/or Continence Assessment and Care []  - Incontinence Assessment and Management 0 []  - 0 Ostomy Care Assessment and Management (repouching, etc.) PROCESS - Coordination of Care X - Simple Patient / Family Education for ongoing care 1 15 []  - 0 Complex (extensive) Patient / Family Education for ongoing care []  - 0 Staff obtains Programmer, systems, Records, Test Results / Process Orders []  - 0 Staff telephones HHA, Nursing Homes / Clarify orders / etc []  - 0 Routine Transfer to another Facility (non-emergent condition) []  - 0 Routine Hospital Admission (non-emergent condition) []  - 0 New Admissions / Biomedical engineer / Ordering NPWT, Apligraf, etc. []  - 0 Emergency Hospital Admission (emergent condition) X- 1 10 Simple Discharge Coordination Kara [name], [name] ([id number]) []  - 0 Complex (extensive) Discharge Coordination PROCESS - Special Needs []  - Pediatric / Minor Patient Management 0 []  - 0 Isolation Patient Management []  - 0 Hearing / Language / Visual special needs []  - 0 Assessment of Community assistance (transportation, D/C planning, etc.) []  -  0 Additional assistance / Altered mentation []  - 0 Support Surface(s) Assessment (bed, cushion, seat, etc.) INTERVENTIONS - Wound Cleansing / Measurement []  - Simple Wound Cleansing - one wound 0 X- 3 5  Complex Wound Cleansing - multiple wounds X- 1 5 Wound Imaging (photographs - any number of wounds) []  - 0 Wound Tracing (instead of photographs) []  - 0 Simple Wound Measurement - one wound X- 3 5 Complex Wound Measurement - multiple wounds INTERVENTIONS - Wound Dressings []  - Small Wound Dressing one or multiple wounds 0 X- 3 15 Medium Wound Dressing one or multiple wounds []  - 0 Large Wound Dressing one or multiple wounds []  - 0 Application of Medications - topical []  - 0 Application of Medications - injection INTERVENTIONS - Miscellaneous []  - External ear exam 0 []  - 0 Specimen Collection (cultures, biopsies, blood, body fluids, etc.) []  - 0 Specimen(s) / Culture(s) sent or taken to Lab for analysis []  - 0 Patient Transfer (multiple staff / Civil Service fast streamer / Similar devices) []  - 0 Simple Staple / Suture removal (25 or less) []  - 0 Complex Staple / Suture removal (26 or more) []  - 0 Hypo / Hyperglycemic Management (close monitor of Blood Glucose) []  - 0 Ankle / Brachial Index (ABI) - do not check if billed separately X- 1 5 Vital Signs Kara [name], [name] ([id number]) Has the patient been seen at the hospital within the last three years: Yes Total Score: 140 Level Of Care: New/Established - Level 4 Electronic Signature(s) Signed: 03/12/2019 8:11:06 AM By: Army Melia Entered By: Army Melia on 03/01/2019 09:40:49 Kara [name], [name] ([id number]) -------------------------------------------------------------------------------- Encounter Discharge Information Details Patient Name: Kara [name], [name] Date of Service: 03/01/2019 9:00 AM Medical Record Number: [id number] Patient Account Number: [id number] Date of Birth/Sex: [birth year] (68 y.o.  F) Treating RN: Army Melia Primary Care Adith Tejada: Tracie Harrier Other Clinician: Referring Roshawn Ayala: Tracie Harrier Treating Aarthi Uyeno/Extender: Melburn Hake, HOYT Weeks in Treatment: 8 Encounter Discharge Information Items Discharge Condition: Stable Ambulatory Status: Cane Discharge Destination: Home Transportation: Private Auto Accompanied By: self Schedule Follow-up Appointment: Yes Clinical Summary of Care: Electronic Signature(s) Signed: 03/12/2019 8:11:06 AM By: Army Melia Entered By: Army Melia on 03/01/2019 09:41:42 JACKSON-Macfadden, Gionni K. ([id number]) -------------------------------------------------------------------------------- Lower Extremity Assessment Details Patient Name: Kara [name], [name] Date of Service: 03/01/2019 9:00 AM Medical Record Number: [id number] Patient Account Number: [id number] Date of Birth/Sex: 11-Mar-1951 (68 y.o. F) Treating RN: Cornell Barman Primary Care Maytal Mijangos: Tracie Harrier Other Clinician: Referring Kei Mcelhiney: Tracie Harrier Treating Xachary Hambly/Extender: Melburn Hake, HOYT Weeks in Treatment: 8 Edema Assessment Assessed: [Left: No] [Right: No] Edema: [Left: No] [Right: No] Vascular Assessment Pulses: Dorsalis Pedis Palpable: [Left:Yes] [Right:Yes] Electronic Signature(s) Signed: 03/01/2019 4:55:32 PM By: Gretta Cool, BSN, RN, CWS, Kim RN, BSN Entered By: Gretta Cool, BSN, RN, CWS, Kim on 03/01/2019 09:28:07 JACKSON-Cambria, Mirren Raliegh Ip ([id number]) -------------------------------------------------------------------------------- Multi Wound Chart Details Patient Name: Kara [name], [name] Date of Service: 03/01/2019 9:00 AM Medical Record Number: [id number] Patient Account Number: [id number] Date of Birth/Sex: June 11, 1950 (68 y.o. F) Treating RN: Army Melia Primary Care Kassity Woodson: Tracie Harrier Other Clinician: Referring Kasim Mccorkle: Tracie Harrier Treating Jeanpierre Thebeau/Extender: Melburn Hake, HOYT Weeks in Treatment: 8 Vital  Signs Height(in): 63 Pulse(bpm): 48 Weight(lbs): 227 Blood Pressure(mmHg): 144/62 Body Mass Index(BMI): 40 Temperature(F): 97.6 Respiratory Rate 16 (breaths/min): Photos: Wound Location: Left Lower Leg - Anterior Right Lower Leg - Lateral Left Lower Leg - Medial, Posterior Wounding Event: Trauma Gradually Appeared Gradually Appeared Primary Etiology: Diabetic Wound/Ulcer of the Diabetic Wound/Ulcer of the Diabetic Wound/Ulcer of the Lower Extremity Lower Extremity Lower Extremity Secondary Etiology: Trauma, Other N/A N/A Comorbid History: Congestive Heart Failure, Congestive Heart Failure, Congestive Heart Failure, Deep Vein Thrombosis, Deep Vein Thrombosis, Deep Vein Thrombosis, Hypertension, Type II Hypertension,  Type II Hypertension, Type II Diabetes, Osteoarthritis, Diabetes, Osteoarthritis, Diabetes, Osteoarthritis, Neuropathy Neuropathy Neuropathy Date Acquired: 12/21/2018 01/30/2019 02/09/2019 Weeks of Treatment: 8 4 2  Wound Status: Open Open Open Clustered Wound: No No Yes Clustered Quantity: N/A N/A 2 Measurements L x W x D 1.2x0.3x0.3 0.5x0.9x0.1 2x1x0.1 (cm) Area (cm) : 0.283 0.353 1.571 Volume (cm) : 0.085 0.035 0.157 % Reduction in Area: 94.70% -80.10% 4.70% % Reduction in Volume: 97.70% -75.00% 4.80% Classification: Grade 1 Grade 1 Grade 2 Exudate Amount: Large Large Large Exudate Type: Serosanguineous Serous Serosanguineous Exudate Color: red, brown amber red, brown Wound Margin: Flat and Intact Distinct, outline attached Distinct, outline attached Granulation Amount: Large (67-100%) Small (1-33%) Large (67-100%) Granulation Quality: Pink, Hyper-granulation Pink Hyper-granulation Kara [name], [name] ([id number]) Necrotic Amount: Small (1-33%) Large (67-100%) Small (1-33%) Exposed Structures: Fat Layer (Subcutaneous Fat Layer (Subcutaneous Fat Layer (Subcutaneous Tissue) Exposed: Yes Tissue) Exposed: Yes Tissue) Exposed: Yes Fascia: No Fascia:  No Fascia: No Tendon: No Tendon: No Tendon: No Muscle: No Muscle: No Muscle: No Joint: No Joint: No Joint: No Bone: No Bone: No Bone: No Epithelialization: None None None Treatment Notes Electronic Signature(s) Signed: 03/12/2019 8:11:06 AM By: Army Melia Entered By: Army Melia on 03/01/2019 09:35:54 JACKSON-Bomberger, Whisper K. ([id number]) -------------------------------------------------------------------------------- [place] Details Patient Name: Kara [name], Kara [name] K. Date of Service: 03/01/2019 9:00 AM Medical Record Number: [id number] Patient Account Number: [id number] Date of Birth/Sex: 12-Mar-1951 (68 y.o. F) Treating RN: Army Melia Primary Care Joniece Smotherman: Tracie Harrier Other Clinician: Referring Lurine Imel: Tracie Harrier Treating Filippo Puls/Extender: Melburn Hake, HOYT Weeks in Treatment: 8 Active Inactive Abuse / Safety / Falls / Self Care Management Nursing Diagnoses: History of Falls Goals: Patient will not experience any injury related to falls Date Initiated: 01/03/2019 Target Resolution Date: 01/17/2019 Goal Status: Active Interventions: Assess fall risk on admission and as needed Notes: Medication Nursing Diagnoses: Knowledge deficit related to medication safety: actual or potential Goals: Patient/caregiver will demonstrate understanding of all current medications Date Initiated: 01/03/2019 Target Resolution Date: 01/03/2019 Goal Status: Active Interventions: Patient/Caregiver given reconciled medication list upon admission, changes in medications and discharge from the [facility] Notes: Necrotic Tissue Nursing Diagnoses: Impaired tissue integrity related to necrotic/devitalized tissue Goals: Necrotic/devitalized tissue will be minimized in the wound bed Date Initiated: 01/03/2019 Target Resolution Date: 01/10/2019 Goal Status: Active Interventions: JACKSON-Al, Raphaela K. ([id number]) Assess patient pain level pre-,  during and post procedure and prior to discharge Treatment Activities: Apply topical anesthetic as ordered : 01/03/2019 Excisional debridement : 01/03/2019 Notes: Wound/Skin Impairment Nursing Diagnoses: Impaired tissue integrity Goals: Ulcer/skin breakdown will have a volume reduction of 30% by week 4 Date Initiated: 01/03/2019 Target Resolution Date: 02/03/2019 Goal Status: Active Interventions: Assess patient/caregiver ability to obtain necessary supplies Assess ulceration(s) every visit Treatment Activities: Skin care regimen initiated : 01/03/2019 Topical wound management initiated : 01/03/2019 Notes: Electronic Signature(s) Signed: 03/12/2019 8:11:06 AM By: Army Melia Entered By: Army Melia on 03/01/2019 09:35:41 JACKSON-Mangiaracina, Shaela K. ([id number]) -------------------------------------------------------------------------------- Pain Assessment Details Patient Name: JACKSON-Zajicek, Oaklee K. Date of Service: 03/01/2019 9:00 AM Medical Record Number: [id number] Patient Account Number: [id number] Date of Birth/Sex: July 16, 1950 (68 y.o. F) Treating RN: Cornell Barman Primary Care Quiara Killian: Tracie Harrier Other Clinician: Referring Laneka Mcgrory: Tracie Harrier Treating Daielle Melcher/Extender: Melburn Hake, HOYT Weeks in Treatment: 8 Active Problems Location of Pain Severity and Description of Pain Patient Has Paino No Site Locations Pain Management and Medication Current Pain Management: Goals for Pain Management Patient denies pain at this time. Electronic Signature(s) Signed: 03/01/2019 4:55:32 PM By: Gretta Cool,  BSN, RN, CWS, Kim RN, BSN Entered By: Gretta Cool, BSN, RN, CWS, Kim on 03/01/2019 09:19:36 [place], Acquanetta Belling ([id number]) -------------------------------------------------------------------------------- Patient/Caregiver Education Details Patient Name: JACKSON-Swint, Jadyn K. Date of Service: 03/01/2019 9:00 AM Medical Record Number: [id number] Patient Account Number:  [id number] Date of Birth/Gender: 24-Feb-1951 (68 y.o. F) Treating RN: Army Melia Primary Care Physician: Tracie Harrier Other Clinician: Referring Physician: Tracie Harrier Treating Physician/Extender: Sharalyn Ink in Treatment: 8 Education Assessment Education Provided To: Patient Education Topics Provided Wound/Skin Impairment: Handouts: Caring for Your Ulcer Methods: Demonstration, Explain/Verbal Responses: State content correctly Electronic Signature(s) Signed: 03/12/2019 8:11:06 AM By: Army Melia Entered By: Army Melia on 03/01/2019 09:41:01 [place], Edith Endave K. ([id number]) -------------------------------------------------------------------------------- Wound Assessment Details Patient Name: JACKSON-Ciccarelli, Andriea K. Date of Service: 03/01/2019 9:00 AM Medical Record Number: [id number] Patient Account Number: [id number] Date of Birth/Sex: June 05, 1950 (68 y.o. F) Treating RN: Cornell Barman Primary Care Prabhav Faulkenberry: Tracie Harrier Other Clinician: Referring Gabreal Worton: Tracie Harrier Treating Zarif Rathje/Extender: Melburn Hake, HOYT Weeks in Treatment: 8 Wound Status Wound Number: 1 Primary Diabetic Wound/Ulcer of the Lower Extremity Etiology: Wound Location: Left Lower Leg - Anterior Secondary Trauma, Other Wounding Event: Trauma Etiology: Date Acquired: 12/21/2018 Wound Open Weeks Of Treatment: 8 Status: Clustered Wound: No Comorbid Congestive Heart Failure, Deep Vein History: Thrombosis, Hypertension, Type II Diabetes, Osteoarthritis, Neuropathy Photos Wound Measurements Length: (cm) 1.2 Width: (cm) 0.3 Depth: (cm) 0.3 Area: (cm) 0.283 Volume: (cm) 0.085 % Reduction in Area: 94.7% % Reduction in Volume: 97.7% Epithelialization: None Tunneling: No Undermining: No Wound Description Classification: Grade 1 Foul Odor Wound Margin: Flat and Intact Slough/Fib Exudate Amount: Large Exudate Type: Serosanguineous Exudate Color: red, brown After  Cleansing: No rino Yes Wound Bed Granulation Amount: Large (67-100%) Exposed Structure Granulation Quality: Pink, Hyper-granulation Fascia Exposed: No Necrotic Amount: Small (1-33%) Fat Layer (Subcutaneous Tissue) Exposed: Yes Necrotic Quality: Adherent Slough Tendon Exposed: No Muscle Exposed: No Joint Exposed: No Bone Exposed: No JACKSON-Bostwick, Mackinzie K. ([id number]) Electronic Signature(s) Signed: 03/01/2019 4:55:32 PM By: Gretta Cool, BSN, RN, CWS, Kim RN, BSN Entered By: Gretta Cool, BSN, RN, CWS, Kim on 03/01/2019 09:26:48 JACKSON-Slane, Allissa K. ([id number]) -------------------------------------------------------------------------------- Wound Assessment Details Patient Name: JACKSON-Recht, Sapir K. Date of Service: 03/01/2019 9:00 AM Medical Record Number: [id number] Patient Account Number: [id number] Date of Birth/Sex: 1951-04-05 (68 y.o. F) Treating RN: Cornell Barman Primary Care Quinn Bartling: Tracie Harrier Other Clinician: Referring Shakeya Kerkman: Tracie Harrier Treating Kabrina Christiano/Extender: Melburn Hake, HOYT Weeks in Treatment: 8 Wound Status Wound Number: 2 Primary Diabetic Wound/Ulcer of the Lower Extremity Etiology: Wound Location: Right Lower Leg - Lateral Wound Open Wounding Event: Gradually Appeared Status: Date Acquired: 01/30/2019 Comorbid Congestive Heart Failure, Deep Vein Weeks Of Treatment: 4 History: Thrombosis, Hypertension, Type II Diabetes, Clustered Wound: No Osteoarthritis, Neuropathy Photos Wound Measurements Length: (cm) 0.5 Width: (cm) 0.9 Depth: (cm) 0.1 Area: (cm) 0.353 Volume: (cm) 0.035 % Reduction in Area: -80.1% % Reduction in Volume: -75% Epithelialization: None Tunneling: No Undermining: No Wound Description Classification: Grade 1 Foul Odor A Wound Margin: Distinct, outline attached Slough/Fibr Exudate Amount: Large Exudate Type: Serous Exudate Color: amber fter Cleansing: No ino Yes Wound Bed Granulation Amount: Small (1-33%) Exposed  Structure Granulation Quality: Pink Fascia Exposed: No Necrotic Amount: Large (67-100%) Fat Layer (Subcutaneous Tissue) Exposed: Yes Necrotic Quality: Adherent Slough Tendon Exposed: No Muscle Exposed: No Joint Exposed: No Bone Exposed: No Electronic Signature(s) SHEKINAH, PITONES ([id number]) Signed: 03/01/2019 4:55:32 PM By: Gretta Cool, BSN, RN, CWS, Kim RN, BSN Entered By: Gretta Cool, BSN, RN, CWS, Kim on 03/01/2019 09:26:20 JACKSON-Felker, Annaliesa  K. ([id number]) -------------------------------------------------------------------------------- Wound Assessment Details Patient Name: JACKSON-Decicco, Beretta K. Date of Service: 03/01/2019 9:00 AM Medical Record Number: [id number] Patient Account Number: [id number] Date of Birth/Sex: 1950-11-11 (68 y.o. F) Treating RN: Cornell Barman Primary Care Chrisopher Pustejovsky: Tracie Harrier Other Clinician: Referring Ajiah Mcglinn: Tracie Harrier Treating Vivienne Sangiovanni/Extender: Melburn Hake, HOYT Weeks in Treatment: 8 Wound Status Wound Number: 3 Primary Diabetic Wound/Ulcer of the Lower Extremity Etiology: Wound Location: Left Lower Leg - Medial, Posterior Wound Open Wounding Event: Gradually Appeared Status: Date Acquired: 02/09/2019 Comorbid Congestive Heart Failure, Deep Vein Weeks Of Treatment: 2 History: Thrombosis, Hypertension, Type II Diabetes, Clustered Wound: Yes Osteoarthritis, Neuropathy Photos Wound Measurements Length: (cm) 2 % Reduc Width: (cm) 1 % Reduc Depth: (cm) 0.1 Epithel Clustered Quantity: 2 Tunneli Area: (cm) 1.571 Underm Volume: (cm) 0.157 tion in Area: 4.7% tion in Volume: 4.8% ialization: None ng: No ining: No Wound Description Classification: Grade 2 Wound Margin: Distinct, outline attached Exudate Amount: Large Exudate Type: Serosanguineous Exudate Color: red, brown Foul Odor After Cleansing: No Slough/Fibrino Yes Wound Bed Granulation Amount: Large (67-100%) Exposed Structure Granulation Quality:  Hyper-granulation Fascia Exposed: No Necrotic Amount: Small (1-33%) Fat Layer (Subcutaneous Tissue) Exposed: Yes Necrotic Quality: Adherent Slough Tendon Exposed: No Muscle Exposed: No Joint Exposed: No Bone Exposed: No JACKSON-Rodenberg, Makensie K. ([id number]) Electronic Signature(s) Signed: 03/01/2019 4:55:32 PM By: Gretta Cool, BSN, RN, CWS, Kim RN, BSN Entered By: Gretta Cool, BSN, RN, CWS, Kim on 03/01/2019 09:25:49 JACKSON-Dacquisto, Nehemie K. ([id number]) -------------------------------------------------------------------------------- Vitals Details Patient Name: JACKSON-Tomaselli, Hannie K. Date of Service: 03/01/2019 9:00 AM Medical Record Number: [id number] Patient Account Number: [id number] Date of Birth/Sex: 01/18/1951 (68 y.o. F) Treating RN: Cornell Barman Primary Care Keelon Zurn: Tracie Harrier Other Clinician: Referring Shakerra Red: Tracie Harrier Treating Rand Etchison/Extender: Melburn Hake, HOYT Weeks in Treatment: 8 Vital Signs Time Taken: 09:20 Temperature (F): 97.6 Height (in): 63 Pulse (bpm): 78 Weight (lbs): 227 Respiratory Rate (breaths/min): 16 Body Mass Index (BMI): 40.2 Blood Pressure (mmHg): 144/62 Reference Range: 80 - 120 mg / dl Electronic Signature(s) Signed: 03/01/2019 4:55:32 PM By: Gretta Cool, BSN, RN, CWS, Kim RN, BSN Entered By: Gretta Cool, BSN, RN, CWS, Kim on 03/01/2019 [phone]

## 2019-03-07 NOTE — Patient Instructions (Signed)

## 2019-03-07 NOTE — Progress Notes (Signed)
Patient's Name: Kara [name]  MRN: [id number]  Referring Provider: Tracie Harrier, MD  DOB: 1950-12-07  PCP: Tracie Harrier, MD  DOS: 03/08/2019  Note by: Gaspar Cola, MD  Service setting: Ambulatory outpatient  Specialty: Interventional Pain Management  Patient type: Established  Location: [name] (AMB) Pain Management Facility  Visit type: Interventional Procedure   Primary Reason for Visit: Interventional Pain Management Treatment. CC: Back Pain (lower  left )  Procedure:          Anesthesia, Analgesia, Anxiolysis:  Type: Diagnostic Sacroiliac Joint Steroid Injection #1  Region: Superior Lumbosacral Region Level: PSIS (Posterior Superior Iliac Spine) Laterality: Bilateral  Type: Local Anesthesia Indication(s): Analgesia         Route: Infiltration ([place]/IM) IV Access: Declined Sedation: Declined  Local Anesthetic: Lidocaine 1-2%  Position: Prone           Indications: 1. Chronic sacroiliac joint pain (Left)   2. Other specified dorsopathies, sacral and sacrococcygeal region   3. Chronic low back pain (Primary Area of Pain) (Bilateral)    Pain Score: Pre-procedure: 8 /10 Post-procedure: 0-No pain/10   Pre-op Assessment:  Kara [name] is a 68 y.o. (year old), female patient, seen today for interventional treatment. She  has a past surgical history that includes Knee surgery; Carpal tunnel release (Bilateral); Colon resection sigmoid (N/A, 11/02/2016); Colostomy (N/A, 11/02/2016); Sigmoidoscopy (N/A, 11/13/2016); PULMONARY VENOGRAPHY (N/A, 11/19/2016); Colonoscopy with propofol (N/A, 02/03/2017); Colon surgery (11/02/2016); IVC FILTER INSERTION (Right, 10/2016); Colostomy reversal (N/A, 02/24/2017); Appendectomy (N/A, 02/24/2017); Lysis of adhesion (N/A, 02/24/2017); laparoscopy (N/A, 02/24/2017); [place] loop diversion (N/A, 02/24/2017); Sigmoidoscopy (N/A, 05/19/2017); Ileostomy closure (N/A, 06/01/2017); IVC FILTER REMOVAL (N/A, 08/09/2017); Sacroplasty (N/A, 11/08/2017);  Cataract extraction w/PHACO (Left, 11/21/2018); and Cataract extraction w/PHACO (Right, 12/12/2018). Kara [name] has a current medication list which includes the following prescription(s): aspirin ec, calcium polycarbophil, candesartan, clindamycin, cyclobenzaprine, dulaglutide, gabapentin, glipizide, hydrocodone-acetaminophen, meclizine, multi-vitamins, ropinirole, warfarin, and warfarin. Her primarily concern today is the Back Pain (lower  left )  Initial Vital Signs:  Pulse/HCG Rate: 77ECG Heart Rate: 87 Temp: 98.2 F (36.8 C) Resp: 18 BP: 137/85 SpO2: 97 %  BMI: Estimated body mass index is 39.86 kg/m as calculated from the following:   Height as of this encounter: 5\' 3"  (1.6 m).   Weight as of this encounter: 225 lb (102.1 kg).  Risk Assessment: Allergies: Reviewed. She is allergic to adhesive [tape]; other; latex; and morphine and related.  Allergy Precautions: None required Coagulopathies: Reviewed. None identified.  Blood-thinner therapy: None at this time Active Infection(s): Reviewed. None identified. Kara [name] is afebrile  Site Confirmation: Kara [name] was asked to confirm the procedure and laterality before marking the site Procedure checklist: Completed Consent: Before the procedure and under the influence of no sedative(s), amnesic(s), or anxiolytics, the patient was informed of the treatment options, risks and possible complications. To fulfill our ethical and legal obligations, as recommended by the American Medical Association's Code of Ethics, I have informed the patient of my clinical impression; the nature and purpose of the treatment or procedure; the risks, benefits, and possible complications of the intervention; the alternatives, including doing nothing; the risk(s) and benefit(s) of the alternative treatment(s) or procedure(s); and the risk(s) and benefit(s) of doing nothing. The patient was provided information about the general risks and  possible complications associated with the procedure. These may include, but are not limited to: failure to achieve desired goals, infection, bleeding, organ or nerve damage, allergic reactions, paralysis, and death. In addition, the patient  was informed of those risks and complications associated to the procedure, such as failure to decrease pain; infection; bleeding; organ or nerve damage with subsequent damage to sensory, motor, and/or autonomic systems, resulting in permanent pain, numbness, and/or weakness of one or several areas of the body; allergic reactions; (i.e.: anaphylactic reaction); and/or death. Furthermore, the patient was informed of those risks and complications associated with the medications. These include, but are not limited to: allergic reactions (i.e.: anaphylactic or anaphylactoid reaction(s)); adrenal axis suppression; blood sugar elevation that in diabetics may result in ketoacidosis or comma; water retention that in patients with history of congestive heart failure may result in shortness of breath, pulmonary edema, and decompensation with resultant heart failure; weight gain; swelling or edema; medication-induced neural toxicity; particulate matter embolism and blood vessel occlusion with resultant organ, and/or nervous system infarction; and/or aseptic necrosis of one or more joints. Finally, the patient was informed that Medicine is not an exact science; therefore, there is also the possibility of unforeseen or unpredictable risks and/or possible complications that may result in a catastrophic outcome. The patient indicated having understood very clearly. We have given the patient no guarantees and we have made no promises. Enough time was given to the patient to ask questions, all of which were answered to the patient's satisfaction. Kara [name] has indicated that she wanted to continue with the procedure. Attestation: I, the ordering provider, attest that I have  discussed with the patient the benefits, risks, side-effects, alternatives, likelihood of achieving goals, and potential problems during recovery for the procedure that I have provided informed consent. Date  Time: 03/08/2019 10:56 AM  Pre-Procedure Preparation:  Monitoring: As per clinic protocol. Respiration, ETCO2, SpO2, BP, heart rate and rhythm monitor placed and checked for adequate function Safety Precautions: Patient was assessed for positional comfort and pressure points before starting the procedure. Time-out: I initiated and conducted the "Time-out" before starting the procedure, as per protocol. The patient was asked to participate by confirming the accuracy of the "Time Out" information. Verification of the correct person, site, and procedure were performed and confirmed by me, the nursing staff, and the patient. "Time-out" conducted as per Joint Commission's Universal Protocol (UP.01.01.01). Time: 1212  Description of Procedure:          Target Area: Superior, posterior, aspect of the sacroiliac fissure Approach: Posterior, paraspinal, ipsilateral approach. Area Prepped: Entire Lower Lumbosacral Region Prepping solution: DuraPrep (Iodine Povacrylex [0.7% available iodine] and Isopropyl Alcohol, 74% w/w) Safety Precautions: Aspiration looking for blood return was conducted prior to all injections. At no point did we inject any substances, as a needle was being advanced. No attempts were made at seeking any paresthesias. Safe injection practices and needle disposal techniques used. Medications properly checked for expiration dates. SDV (single dose vial) medications used. Description of the Procedure: Protocol guidelines were followed. The patient was placed in position over the procedure table. The target area was identified and the area prepped in the usual manner. Skin & deeper tissues infiltrated with local anesthetic. Appropriate amount of time allowed to pass for local anesthetics  to take effect. The procedure needle was advanced under fluoroscopic guidance into the sacroiliac joint until a firm endpoint was obtained. Proper needle placement secured. Negative aspiration confirmed. Solution injected in intermittent fashion, asking for systemic symptoms every 0.5cc of injectate. The needles were then removed and the area cleansed, making sure to leave some of the prepping solution back to take advantage of its long term bactericidal properties. Vitals:  03/08/19 1053 03/08/19 1211 03/08/19 1214 03/08/19 1218  BP: 137/85 (!) 145/89 (!) 141/76 (!) 151/86  Pulse: 77     Resp: 18 (!) 21 19 (!) 21  Temp: 98.2 F (36.8 C)     TempSrc: Oral     SpO2: 97% 98% 97% 99%  Weight: 225 lb (102.1 kg)     Height: 5\' 3"  (1.6 m)       Start Time: 1212 hrs. End Time: 1215 hrs. Materials:  Needle(s) Type: Spinal Needle Gauge: 22G Length: 7-in Medication(s): Please see orders for medications and dosing details.  Imaging Guidance (Non-Spinal):          Type of Imaging Technique: Fluoroscopy Guidance (Non-Spinal) Indication(s): Assistance in needle guidance and placement for procedures requiring needle placement in or near specific anatomical locations not easily accessible without such assistance. Exposure Time: Please see nurses notes. Contrast: Before injecting any contrast, we confirmed that the patient did not have an allergy to iodine, shellfish, or radiological contrast. Once satisfactory needle placement was completed at the desired level, radiological contrast was injected. Contrast injected under live fluoroscopy. No contrast complications. See chart for type and volume of contrast used. Fluoroscopic Guidance: I was personally present during the use of fluoroscopy. "Tunnel Vision Technique" used to obtain the best possible view of the target area. Parallax error corrected before commencing the procedure. "Direction-depth-direction" technique used to introduce the needle under  continuous pulsed fluoroscopy. Once target was reached, antero-posterior, oblique, and lateral fluoroscopic projection used confirm needle placement in all planes. Images permanently stored in EMR. Interpretation: I personally interpreted the imaging intraoperatively. Adequate needle placement confirmed in multiple planes. Appropriate spread of contrast into desired area was observed. No evidence of afferent or efferent intravascular uptake. Permanent images saved into the patient's record.  Antibiotic Prophylaxis:   Anti-infectives (From admission, onward)   None     Indication(s): None identified  Post-operative Assessment:  Post-procedure Vital Signs:  Pulse/HCG Rate: 7781 Temp: 98.2 F (36.8 C) Resp: (!) 21 BP: (!) 151/86 SpO2: 99 %  EBL: None  Complications: No immediate post-treatment complications observed by team, or reported by patient.  Note: The patient tolerated the entire procedure well. A repeat set of vitals were taken after the procedure and the patient was kept under observation following institutional policy, for this type of procedure. Post-procedural neurological assessment was performed, showing return to baseline, prior to discharge. The patient was provided with post-procedure discharge instructions, including a section on how to identify potential problems. Should any problems arise concerning this procedure, the patient was given instructions to immediately contact us, at any time, without hesitation. In any case, we plan to contact the patient by telephone for a follow-up status report regarding this interventional procedure.  Comments:  No additional relevant information.  Plan of Care  Orders:  Orders Placed This Encounter  Procedures  . SACROILIAC JOINT INJECTION    Scheduling Instructions:     Side: Bilateral     Sedation: No Sedation.     Timeframe: Today    Order Specific Question:   Where will this procedure be performed?    Answer:   [name]  . [facility] C-ARM 1-60 MIN NO REPORT    Intraoperative interpretation by procedural physician at [place].    Standing Status:   Standing    Number of Occurrences:   1    Order Specific Question:   Reason for exam:    Answer:   Assistance  in needle guidance and placement for procedures requiring needle placement in or near specific anatomical locations not easily accessible without such assistance.  . Informed Consent Details: Physician/Practitioner Attestation; Transcribe to consent form and obtain patient signature    Provider Attestation: I, [place] Dossie Arbour, MD, (Pain Management Specialist), the physician/practitioner, attest that I have discussed with the patient the benefits, risks, side effects, alternatives, likelihood of achieving goals and potential problems during recovery for the procedure that I have provided informed consent.    Scheduling Instructions:     Procedure: Sacroiliac Joint Block     Indication/Reason: Chronic Low Back and Hip Pain secondary to Sacroiliac Joint Pain (Arthralgia/Arthropathy)     Nursing Order: Transcribe to consent form and obtain patient signature.     Note: Always confirm laterality of pain with Kara [name], before procedure.  . Provide equipment / supplies at bedside    Equipment required: Single use, disposable, "Block Tray"    Standing Status:   Standing    Number of Occurrences:   1    Order Specific Question:   Specify    Answer:   Block Tray   Chronic Opioid Analgesic:  Hydrocodone/APAP 5/325, 1 tab PO QD PRN (5 mg/day of hydrocodone) (#60 was lasting her 4-5 mo. Last filled on 02/08/2019) as of today 02/28/2019, he still has 25 of the 30 pills left.  She indicates not needing a refill at this time. MME: 5 mg/day.   Medications ordered for procedure: Meds ordered this encounter  Medications  . lidocaine (XYLOCAINE) 2 % (with pres) injection 400 mg  . ropivacaine (PF) 2 mg/mL (0.2%) (NAROPIN)  injection 9 mL  . methylPREDNISolone acetate (DEPO-MEDROL) injection 80 mg   Medications administered: We administered lidocaine, ropivacaine (PF) 2 mg/mL (0.2%), and methylPREDNISolone acetate.  See the medical record for exact dosing, route, and time of administration.  Follow-up plan:   Return in about 2 weeks (around 03/22/2019) for (VV), (PP).       Interventional management options:  Considering:   NOTE:COUMADINANTICOAGULATION(Stop:5days  Re-start: 2hrs) NO RFAuntil BMIis <35. Diagnostic L5-S1 LESI Diagnostic bilateral L3TFESI Diagnostic bilateral L4TFESI Diagnostic bilateral L5 TFESI Diagnostic left SI joint block #1  Possible righ SI joint RFA Possible bilateral lumbar facet RFA(NO RFAuntil BMIis <35.) Diagnostic right IA hip joint injection Diagnostic right femoral +ObturatorNB Possible right femoral + obturator nerve RFA   Palliative PRN treatment(s):   Palliative/Therapeutic left L4-5 LESI #2 Diagnostic left lumbar facet block#4 Palliativeright lumbar facet block(s) #8 Palliative right SI joint block #4 Palliative/Therapeutic (Midline) caudalESI#4     Recent Visits Date Type Provider Dept  02/28/19 Office Visit Milinda Pointer, MD [name]  02/08/19 Procedure visit Milinda Pointer, MD [name]  02/05/19 Office Visit Milinda Pointer, MD [name]  12/26/18 Procedure visit Milinda Pointer, MD [name]  Showing recent visits within past 90 days and meeting all other requirements   Today's Visits Date Type Provider Dept  03/08/19 Procedure visit Milinda Pointer, MD [name]  Showing today's visits and meeting all other requirements   Future Appointments Date Type Provider Dept  04/02/19 Appointment Milinda Pointer, MD [name]  Showing future appointments within next 90 days and meeting all other requirements   Disposition: Discharge  home  Discharge Date & Time: 03/08/2019; 1221 hrs.   Primary Care Physician: Tracie Harrier, MD Location: [facility] Note by: Gaspar Cola, MD Date: 03/08/2019; Time: 12:43 PM  Disclaimer:  Medicine is  not an Chief Strategy Officer. The only guarantee in medicine is that nothing is guaranteed. It is important to note that the decision to proceed with this intervention was based on the information collected from the patient. The Data and conclusions were drawn from the patient's questionnaire, the interview, and the physical examination. Because the information was provided in large part by the patient, it cannot be guaranteed that it has not been purposely or unconsciously manipulated. Every effort has been made to obtain as much relevant data as possible for this evaluation. It is important to note that the conclusions that lead to this procedure are derived in large part from the available data. Always take into account that the treatment will also be dependent on availability of resources and existing treatment guidelines, considered by other Pain Management Practitioners as being common knowledge and practice, at the time of the intervention. For Medico-Legal purposes, it is also important to point out that variation in procedural techniques and pharmacological choices are the acceptable norm. The indications, contraindications, technique, and results of the above procedure should only be interpreted and judged by a Board-Certified Interventional Pain Specialist with extensive familiarity and expertise in the same exact procedure and technique.

## 2019-03-08 ENCOUNTER — Other Ambulatory Visit: Payer: Self-pay

## 2019-03-08 ENCOUNTER — Ambulatory Visit
Admission: RE | Admit: 2019-03-08 | Discharge: 2019-03-08 | Disposition: A | Discharge status: Home / Self Care | Payer: PPO | Source: Ambulatory Visit | Attending: Pain Medicine | Admitting: Pain Medicine

## 2019-03-08 ENCOUNTER — Encounter: Payer: Self-pay | Admitting: Pain Medicine

## 2019-03-08 ENCOUNTER — Ambulatory Visit (HOSPITAL_BASED_OUTPATIENT_CLINIC_OR_DEPARTMENT_OTHER): Payer: PPO | Admitting: Pain Medicine

## 2019-03-08 ENCOUNTER — Encounter: Payer: PPO | Admitting: Physician Assistant

## 2019-03-08 VITALS — BP 151/86 | HR 77 | Temp 98.2°F | Resp 21 | Ht 63.0 in | Wt 225.0 lb

## 2019-03-08 DIAGNOSIS — M5388 Other specified dorsopathies, sacral and sacrococcygeal region: Secondary | ICD-10-CM | POA: Insufficient documentation

## 2019-03-08 DIAGNOSIS — L97222 Non-pressure chronic ulcer of left calf with fat layer exposed: Secondary | ICD-10-CM | POA: Diagnosis not present

## 2019-03-08 DIAGNOSIS — Z7982 Long term (current) use of aspirin: Secondary | ICD-10-CM | POA: Diagnosis not present

## 2019-03-08 DIAGNOSIS — L97812 Non-pressure chronic ulcer of other part of right lower leg with fat layer exposed: Secondary | ICD-10-CM | POA: Diagnosis not present

## 2019-03-08 DIAGNOSIS — Z79899 Other long term (current) drug therapy: Secondary | ICD-10-CM | POA: Diagnosis not present

## 2019-03-08 DIAGNOSIS — Z885 Allergy status to narcotic agent status: Secondary | ICD-10-CM | POA: Diagnosis not present

## 2019-03-08 DIAGNOSIS — M533 Sacrococcygeal disorders, not elsewhere classified: Secondary | ICD-10-CM | POA: Insufficient documentation

## 2019-03-08 DIAGNOSIS — G8929 Other chronic pain: Secondary | ICD-10-CM | POA: Insufficient documentation

## 2019-03-08 DIAGNOSIS — Z7901 Long term (current) use of anticoagulants: Secondary | ICD-10-CM | POA: Insufficient documentation

## 2019-03-08 DIAGNOSIS — Z91040 Latex allergy status: Secondary | ICD-10-CM | POA: Diagnosis not present

## 2019-03-08 DIAGNOSIS — Z7984 Long term (current) use of oral hypoglycemic drugs: Secondary | ICD-10-CM | POA: Diagnosis not present

## 2019-03-08 DIAGNOSIS — S81802A Unspecified open wound, left lower leg, initial encounter: Secondary | ICD-10-CM | POA: Diagnosis not present

## 2019-03-08 DIAGNOSIS — L97822 Non-pressure chronic ulcer of other part of left lower leg with fat layer exposed: Secondary | ICD-10-CM | POA: Diagnosis not present

## 2019-03-08 DIAGNOSIS — M545 Low back pain: Secondary | ICD-10-CM | POA: Diagnosis not present

## 2019-03-08 MED ORDER — ROPIVACAINE HCL 2 MG/ML IJ SOLN
9.0000 mL | Freq: Once | INTRAMUSCULAR | Status: AC
  Administered 2019-03-08: 12:00:00 9 mL via INTRA_ARTICULAR
  Filled 2019-03-08: qty 10

## 2019-03-08 MED ORDER — METHYLPREDNISOLONE ACETATE 80 MG/ML IJ SUSP
80.0000 mg | Freq: Once | INTRAMUSCULAR | Status: AC
  Administered 2019-03-08: 80 mg via INTRA_ARTICULAR
  Filled 2019-03-08: qty 1

## 2019-03-08 MED ORDER — LIDOCAINE HCL 2 % IJ SOLN
20.0000 mL | Freq: Once | INTRAMUSCULAR | Status: AC
  Administered 2019-03-08: 400 mg
  Filled 2019-03-08: qty 40

## 2019-03-08 NOTE — Progress Notes (Addendum)
Kara [name] ([id number]) Visit Report for 03/08/2019 Chief Complaint Document Details Patient Name: [name], [name] Date of Service: 03/08/2019 9:00 AM Medical Record Number: [id number] Patient Account Number: [id number] Date of Birth/Sex: 01-23-1951 (68 y.o. F) Treating RN: Cornell Barman Primary Care Provider: Tracie Harrier Other Clinician: Referring Provider: Tracie Harrier Treating Provider/Extender: Melburn Hake, Shyan Scalisi Weeks in Treatment: 9 Information Obtained from: Patient Chief Complaint Left Leg ulcer x 2 weeks Electronic Signature(s) Signed: 03/08/2019 9:13:34 AM By: Worthy Keeler PA-C Entered By: Worthy Keeler on 03/08/2019 09:13:33 [name], [name] ([id number]) -------------------------------------------------------------------------------- HPI Details Patient Name: [name], [name] Date of Service: 03/08/2019 9:00 AM Medical Record Number: [id number] Patient Account Number: [id number] Date of Birth/Sex: 1950/06/11 (68 y.o. F) Treating RN: Cornell Barman Primary Care Provider: Tracie Harrier Other Clinician: Referring Provider: Tracie Harrier Treating Provider/Extender: Melburn Hake, Danta Baumgardner Weeks in Treatment: 9 History of Present Illness HPI Description: Pleasant 68 year old Caucasian female who fell down a few steps in her home 2 weeks ago and developed bruises on both legs on the shins, on the left shin area she did have an open area that scabbed, patient did ED on 8/1 at which point she had dressing applied to the left shin wound, she had a right shin hematoma that was lanced with oozing of blood after. Since that time patient has noticed that the left calf leg wound developed a black covering on top and had started to hurt, she has been dressing this daily with an antibiotic ointment and gauze wrap. 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 She had a traumatic wound on the left lower extremity. We have been using silver alginate because of drainage. She has been in compression 8/26; this was initially a traumatic wound on the left lower extremity. It has some depth superiorly appears to have come in this week. We have been using silver alginate because of drainage 01/24/19 once again the wrap seems to have come down and she took it off and put Vaseline on the wound on Sunday. She did not call the clinic. About 75% of this is fully granulated 25% is not and that is superiorly. We have been using silver alginate but all changed to silver collagen today. 02/01/19 on evaluation today patient appears to be doing okay in regard to her left anterior lower extremity ulcer. She also has a right lower leg ulcer which really has not been monitored as much at this point due to the fact that it appeared to be doing quite well according to what that [facility] felt. Nonetheless there appears to be some fluid that is leaking from the right side as well upon evaluation today. I think this may need to be addressed at this point unfortunately. I don't think it's just gonna heal on its own at least not very effectively. No fevers, chills, nausea, or vomiting noted at this time. 02/09/2019 today patient appears to be doing  a little better in regard to her left lower extremity. On the right lower extremity she actually does have some necrotic tissue noted at this point. Both areas do seem to be draining quite a bit  at this time. There is no sign of active infection at this point. 02/15/2019 on evaluation today patient unfortunately is having some issues today with a abscess on the medial aspect of her left lower extremity which is new since last week. She states that this actually occurred on Friday she had a blister initially that she took her wrap off and tried to pop. Subsequently since that time it is gotten worse become more painful and appears to be much more infected. There does not appear to be any signs of active systemic infection which is good news. No fever chills noted. She actually called into the office on Tuesday at which point I sent in a prescription for Keflex until she came in today for evaluation. Apparently she called yesterday as well asking what she can do about the pain and was told to take Tylenol as well. Currently she states that this unfortunately is exquisitely tender even to light touch. Unfortunately I do believe this is a much deeper abscess that is even apparent she has a lot of necrotic/hyper granular tissue noted on the surface of the wound. 02/23/2019 upon evaluation today patient actually appears to be doing slightly better compared to last evaluation. I did review her wound culture today which showed that she was positive for MRSA. This is sensitive to clindamycin and tetracycline orally only. Fortunately there is no signs of active infection at this time systemically which is good news. No fevers, chills, nausea, vomiting, or diarrhea. 03/01/2019 on evaluation today patient appears to be doing well with regard to her lower extremity wounds. Her infection seems to be under much better control than previously noted which is excellent news. With that being said I still feel like that [place]. ([id number]) though she is doing a lot better she still has a little better time to go as far as getting these wounds to completely close. Fortunately there is no signs  of active infection at this time. No fever chills noted. 03/08/2019 on evaluation today patient appears to be doing excellent with regard to her wounds. All are measuring significantly smaller which is great news and I do believe she is doing quite well. Fortunately there is no evidence of active infection at this time. No fevers, chills, nausea, vomiting, or diarrhea. Electronic Signature(s) Signed: 03/08/2019 9:37:04 AM By: Worthy Keeler PA-C Entered By: Worthy Keeler on 03/08/2019 09:37:04 [name], [name] ([id number]) -------------------------------------------------------------------------------- Physical Exam Details Patient Name: [name], [name] Date of Service: 03/08/2019 9:00 AM Medical Record Number: [id number] Patient Account Number: [id number] Date of Birth/Sex: 04-06-1951 (68 y.o. F) Treating RN: Cornell Barman Primary Care Provider: Tracie Harrier Other Clinician: Referring Provider: Tracie Harrier Treating Provider/Extender: Melburn Hake, Eliga Arvie Weeks in Treatment: 9 Constitutional Well-nourished and well-hydrated in no acute distress. Respiratory normal breathing without difficulty. clear to auscultation bilaterally. Cardiovascular regular rate and rhythm with normal S1, S2. Psychiatric this patient is able to make decisions and demonstrates good insight into disease process. Alert and Oriented x 3. pleasant and cooperative. Notes Upon inspection today patient's wound bed showed signs again of good granulation at all wound sites there was no significant slough buildup the can be mechanically wiped away which was good news. Once all this was cleaned away the wounds all appear to  be doing excellent. I am very pleased. Electronic Signature(s) Signed: 03/08/2019 9:37:31 AM By: Worthy Keeler PA-C Entered By: Worthy Keeler on 03/08/2019 09:37:31 JACKSON-Dunsmore, Shatira K.  ([id number]) -------------------------------------------------------------------------------- Physician Orders Details Patient Name: [name], [name] Date of Service: 03/08/2019 9:00 AM Medical Record Number: [id number] Patient Account Number: [id number] Date of Birth/Sex: 12/21/1950 (68 y.o. 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 o Other: - wash head to toe with Hibiclense once daily for 1 week Wound #2 Right,Lateral Lower Leg o May shower with protection. o Other: - wash head to toe with Hibiclense once daily for 1 week Wound #3 Left,Medial,Posterior Lower Leg o May shower with protection. o Other: - wash head to toe with Hibiclense once daily for 1 week Anesthetic (add to Medication List) Wound #1 Left,Anterior Lower Leg o Topical Lidocaine 4% cream applied to wound bed prior to debridement (In Clinic Only). Wound #2 Right,Lateral Lower Leg o Topical Lidocaine 4% cream applied to wound bed prior to debridement (In Clinic Only). Wound #3 Left,Medial,Posterior Lower Leg o Topical Lidocaine 4% cream applied to wound bed prior to debridement (In Clinic Only). Primary Wound Dressing Wound #1 Left,Anterior Lower Leg o Silver Collagen Wound #2 Right,Lateral Lower Leg o Silver Collagen Wound #3 Left,Medial,Posterior Lower Leg o Silver Collagen Secondary Dressing Wound #1 Left,Anterior Lower  Leg o ABD and Kerlix/Conform [name], [name] ([id number]) Wound #2 Right,Lateral Lower Leg o ABD and Kerlix/Conform Wound #3 Left,Medial,Posterior Lower Leg o ABD and Kerlix/Conform Dressing Change Frequency Wound #1 Left,Anterior Lower Leg o Change dressing every other day. Wound #2 Right,Lateral Lower Leg o Change dressing every other day. Wound #3 Left,Medial,Posterior Lower Leg o Change dressing every other day. Follow-up Appointments Wound #1 Left,Anterior Lower Leg o Return Appointment in 1 week. o Nurse Visit as needed Wound #2 Right,Lateral Lower Leg o Return Appointment in 1 week. o Nurse Visit as needed Wound #3 Left,Medial,Posterior Lower Leg o Return Appointment in 1 week. o Nurse Visit as needed Edema Control Wound #1 Left,Anterior Lower Leg o Elevate legs to the level of the heart and pump ankles as often as possible o Other: - TubiGrip F bilateral Wound #2 Right,Lateral Lower Leg o Elevate legs to the level of the heart and pump ankles as often as possible o Other: - TubiGrip F bilateral Wound #3 Left,Medial,Posterior Lower Leg o Elevate legs to the level of the heart and pump ankles as often as possible o Other: - TubiGrip F bilateral Medications-please add to medication list. o Topical Antibiotic - mupirocin - once weekly, put a small amount on the tips of your fingers and in each nostril Electronic Signature(s) Signed: 03/08/2019 5:55:45 PM By: Gretta Cool, BSN, RN, CWS, Kim RN, BSN Signed: 03/08/2019 6:39:08 PM By: Worthy Keeler PA-C Entered By: Gretta Cool, BSN, RN, CWS, Kim on 03/08/2019 09:35:57 [name], [name] ([id number]) -------------------------------------------------------------------------------- Problem List Details Patient Name: JACKSON-Woodrick, Belky K. Date of Service: 03/08/2019 9:00 AM Medical Record Number: [id number] Patient Account Number: [id number] Date of Birth/Sex: 09-May-1951 (68 y.o.  F)  Treating RN: Cornell Barman Primary Care Provider: Tracie Harrier Other Clinician: Referring Provider: Tracie Harrier Treating Provider/Extender: Melburn Hake, Devarious Pavek Weeks in Treatment: 9 Active Problems ICD-10 Evaluated Encounter Code Description Active Date Today Diagnosis E11.9 Type 2 diabetes mellitus without complications 4/33/2951 No Yes L97.222 Non-pressure chronic ulcer of left calf with fat layer exposed 01/03/2019 No Yes L97.812 Non-pressure chronic ulcer of other part of right lower leg 02/02/2019 No Yes with fat layer exposed L02.416 Cutaneous abscess of left lower limb 02/15/2019 No Yes Inactive Problems Resolved Problems Electronic Signature(s) Signed: 03/08/2019 9:13:23 AM By: Worthy Keeler PA-C Entered By: Worthy Keeler on 03/08/2019 09:13:22 [name], [name] ([id number]) -------------------------------------------------------------------------------- Progress Note Details Patient Name: JACKSON-Strozier, Marticia K. Date of Service: 03/08/2019 9:00 AM Medical Record Number: [id number] Patient Account Number: [id number] Date of Birth/Sex: [birth year] (68 y.o. F) Treating RN: Cornell Barman Primary Care Provider: Tracie Harrier Other Clinician: Referring Provider: Tracie Harrier Treating Provider/Extender: Melburn Hake, Mathayus Stanbery Weeks in Treatment: 9 Subjective Chief Complaint Information obtained from Patient Left Leg ulcer x 2 weeks History of Present Illness (HPI) Pleasant 68 year old Caucasian female who fell down a few steps in her home 2 weeks ago and developed bruises on both legs on the shins, on the left shin area she did have an open area that scabbed, patient did ED on 8/1 at which point she had dressing applied to the left shin wound, she had a right shin hematoma that was lanced with oozing of blood after. Since that time patient has noticed that the left calf leg wound developed a black covering on top and had started to hurt, she has been dressing  this daily with an antibiotic ointment and gauze wrap. 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 She had a traumatic wound on the left lower extremity. We have been using silver alginate because of drainage. She has been in compression 8/26; this was initially a traumatic wound on the left lower extremity. It has some depth superiorly appears to have come in this week. We have been using silver alginate because of drainage 01/24/19 once again the wrap seems to have come down and she took it off and put Vaseline on the wound on Sunday. She did not call the clinic. About 75% of this is fully granulated 25% is not and that is superiorly. We have been using silver alginate but all changed to silver collagen today. 02/01/19 on evaluation today patient appears to be doing okay in regard to her left anterior lower extremity ulcer. She also has a right lower leg ulcer which really has not been monitored as much at this point due to the fact that it appeared to be doing quite well according to what that [facility] felt. Nonetheless there appears to be some fluid that is leaking from the right side as well upon evaluation today. I think this may need to be addressed at this point unfortunately. I don't  think it's just gonna heal on its own at least not very effectively. No fevers, chills, nausea, or vomiting noted at this time. 02/09/2019 today patient appears to be doing a little  better in regard to her left lower extremity. On the right lower extremity she actually does have some necrotic tissue noted at this point. Both areas do seem to be draining quite a bit at this time. There is no sign of active infection at this point. 02/15/2019 on evaluation today patient unfortunately is having some issues today with a abscess on the medial aspect of her left lower extremity which is new since last week. She states that this actually occurred on Friday she had a blister initially that she took her wrap off and tried to pop. Subsequently since that time it is gotten worse become more painful and appears to be much more infected. There does not appear to be any signs of active systemic infection which is good news. No fever chills noted. She actually called into the office on Tuesday at which point I sent in a prescription for Keflex until she came in today for evaluation. Apparently she called yesterday as well asking what she can do about the pain and was told to take Tylenol as well. Currently she states that this unfortunately is exquisitely tender even to light touch. Unfortunately I do believe this is a much deeper abscess that is even apparent she has a lot of necrotic/hyper granular tissue noted on the surface of the wound. 02/23/2019 upon evaluation today patient actually appears to be doing slightly better compared to last evaluation. I did review her wound culture today which showed that she was positive for MRSA. This is sensitive to clindamycin and tetracycline orally JACKSON-Ruggerio, Marlette K. ([id number]) only. Fortunately there is no signs of active infection at this time systemically which is good news. No fevers, chills, nausea, vomiting, or diarrhea. 03/01/2019 on evaluation today patient appears to be doing well with regard to her lower extremity wounds. Her infection seems to be under much better control than previously noted which is excellent news. With that being  said I still feel like that though she is doing a lot better she still has a little better time to go as far as getting these wounds to completely close. Fortunately there is no signs of active infection at this time. No fever chills noted. 03/08/2019 on evaluation today patient appears to be doing excellent with regard to her wounds. All are measuring significantly smaller which is great news and I do believe she is doing quite well. Fortunately there is no evidence of active infection at this time. No fevers, chills, nausea, vomiting, or diarrhea. Patient History Information obtained from Patient. Family History Diabetes - Siblings, No family history of Cancer, Heart Disease, Hereditary Spherocytosis, Hypertension, Kidney Disease, Lung Disease, Seizures, Stroke, Thyroid Problems, Tuberculosis. Social History Never smoker, Marital Status - Married, Alcohol Use - Never, Drug Use - No History, Caffeine Use - Daily. Medical History Respiratory Denies history of Aspiration, Asthma, Chronic Obstructive Pulmonary Disease (COPD), Pneumothorax, Sleep Apnea, Tuberculosis Cardiovascular Patient has history of Congestive Heart Failure, Deep Vein Thrombosis, Hypertension Denies history of Angina, Arrhythmia, Coronary Artery Disease, Hypotension, Myocardial Infarction, Peripheral Arterial Disease, Peripheral Venous Disease, Phlebitis, Vasculitis Endocrine Patient has history of Type II Diabetes Denies history of Type I Diabetes Integumentary (Skin) Denies history of History of Burn, History of pressure wounds Musculoskeletal Patient has history of Osteoarthritis Denies history of Gout, Rheumatoid Arthritis, Osteomyelitis Neurologic Patient  has history of Neuropathy Denies history of Dementia, Quadriplegia, Paraplegia, Seizure Disorder Medical And Surgical History Notes Respiratory hx pulmonary embolism Musculoskeletal OP Review of Systems (ROS) Constitutional Symptoms (General  Health) Denies complaints or symptoms of Fatigue, Fever, Chills, Marked Weight Change. Respiratory Denies complaints or symptoms of Chronic or frequent coughs, Shortness of Breath. Cardiovascular Denies complaints or symptoms of Chest pain, LE edema. Psychiatric Denies complaints or symptoms of Anxiety, Claustrophobia. JACKSON-Banes, Camdyn K. ([id number]) Objective Constitutional Well-nourished and well-hydrated in no acute distress. Vitals Time Taken: 9:14 AM, Height: 63 in, Weight: 227 lbs, BMI: 40.2, Temperature: 98.3 F, Pulse: 75 bpm, Respiratory Rate: 18 breaths/min, Blood Pressure: 131/62 mmHg. Respiratory normal breathing without difficulty. clear to auscultation bilaterally. Cardiovascular regular rate and rhythm with normal S1, S2. Psychiatric this patient is able to make decisions and demonstrates good insight into disease process. Alert and Oriented x 3. pleasant and cooperative. General Notes: Upon inspection today patient's wound bed showed signs again of good granulation at all wound sites there was no significant slough buildup the can be mechanically wiped away which was good news. Once all this was cleaned away the wounds all appear to be doing excellent. I am very pleased. Integumentary (Hair, Skin) Wound #1 status is Open. Original cause of wound was Trauma. The wound is located on the Left,Anterior Lower Leg. The wound measures 0.7cm length x 0.2cm width x 0.1cm depth; 0.11cm^2 area and 0.011cm^3 volume. There is Fat Layer (Subcutaneous Tissue) Exposed exposed. There is no tunneling or undermining noted. There is a large amount of serosanguineous drainage noted. The wound margin is flat and intact. There is large (67-100%) pink, hyper - granulation within the wound bed. There is a small (1-33%) amount of necrotic tissue within the wound bed including Adherent Slough. Wound #2 status is Open. Original cause of wound was Gradually Appeared. The wound is located on  the Right,Lateral Lower Leg. The wound measures 0.3cm length x 0.3cm width x 0.1cm depth; 0.071cm^2 area and 0.007cm^3 volume. There is Fat Layer (Subcutaneous Tissue) Exposed exposed. There is no tunneling or undermining noted. There is a large amount of serous drainage noted. The wound margin is distinct with the outline attached to the wound base. There is large (67-100%) pink granulation within the wound bed. There is no necrotic tissue within the wound bed. Wound #3 status is Open. Original cause of wound was Gradually Appeared. The wound is located on the Left,Medial,Posterior Lower Leg. The wound measures 0.9cm length x 0.7cm width x 0.1cm depth; 0.495cm^2 area and 0.049cm^3 volume. There is Fat Layer (Subcutaneous Tissue) Exposed exposed. There is no tunneling or undermining noted. There is a large amount of serosanguineous drainage noted. The wound margin is distinct with the outline attached to the wound base. There is large (67-100%) hyper - granulation within the wound bed. There is a small (1-33%) amount of necrotic tissue within the wound bed including Adherent Slough. Assessment JACKSON-Melena, Abrar K. 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 Other: - wash head to toe with Hibiclense once daily for 1 week Wound #2 Right,Lateral Lower Leg: May shower with protection. Other: - wash head to toe with Hibiclense once daily for 1 week Wound #3 Left,Medial,Posterior Lower Leg: May shower with protection.  Other: - wash head to toe with Hibiclense once daily for 1 week Anesthetic (add to Medication List): Wound #1 Left,Anterior Lower Leg: Topical Lidocaine 4% cream applied  to wound bed prior to debridement (In Clinic Only). Wound #2 Right,Lateral Lower Leg: Topical Lidocaine 4% cream applied to wound bed prior to debridement (In Clinic Only). Wound #3 Left,Medial,Posterior Lower Leg: Topical Lidocaine 4% cream applied to wound bed prior to debridement (In Clinic Only). Primary Wound Dressing: Wound #1 Left,Anterior Lower Leg: Silver Collagen Wound #2 Right,Lateral Lower Leg: Silver Collagen Wound #3 Left,Medial,Posterior Lower Leg: Silver Collagen Secondary Dressing: Wound #1 Left,Anterior Lower Leg: ABD and Kerlix/Conform Wound #2 Right,Lateral Lower Leg: ABD and Kerlix/Conform Wound #3 Left,Medial,Posterior Lower Leg: ABD and Kerlix/Conform Dressing Change Frequency: Wound #1 Left,Anterior Lower Leg: Change dressing every other day. Wound #2 Right,Lateral Lower Leg: Change dressing every other day. Wound #3 Left,Medial,Posterior Lower Leg: Change dressing every other day. Follow-up Appointments: Wound #1 Left,Anterior Lower Leg: Return Appointment in 1 week. Nurse Visit as needed Wound #2 Right,Lateral Lower Leg: Return Appointment in 1 week. JACKSON-Tibbett, Aleyssa KMarland Kitchen ([id number]) Nurse Visit as needed Wound #3 Left,Medial,Posterior Lower Leg: Return Appointment in 1 week. Nurse Visit as needed Edema Control: Wound #1 Left,Anterior Lower Leg: Elevate legs to the level of the heart and pump ankles as often as possible Other: - TubiGrip F bilateral Wound #2 Right,Lateral Lower Leg: Elevate legs to the level of the heart and pump ankles as often as possible Other: - TubiGrip F bilateral Wound #3 Left,Medial,Posterior Lower Leg: Elevate legs to the level of the heart and pump ankles as often as possible Other: - TubiGrip F bilateral Medications-please add to medication list.: Topical Antibiotic - mupirocin - once weekly, put a small amount on the tips of your fingers and in each nostril 1. My suggestion currently is good to be that we  continue with the collagen at this time and the patient is in agreement with the plan. 2. I am in a suggest as well that we continue to secure everything with Kerlix at this point as that will avoid any bandaging and again I am concerned about the bandaging causing skin irritation as were seeing today. 3. We will use Tubigrip over everything to hold this in place. 4. I do recommend the patient continue to elevate her legs as much as possible and continue with the mupirocin as well as watching with Hibiclens once a week as we discussed. This is to help prevent recurrent MRSA infection. We will see patient back for reevaluation in 1 week here in the clinic. If anything worsens or changes patient will contact our office for additional recommendations. Electronic Signature(s) Signed: 03/08/2019 9:38:22 AM By: Worthy Keeler PA-C Entered By: Worthy Keeler on 03/08/2019 09:38:22 JACKSON-Carrico, Kaitlyn K. ([id number]) -------------------------------------------------------------------------------- ROS/PFSH Details Patient Name: JACKSON-Olexa, Orella K. Date of Service: 03/08/2019 9:00 AM Medical Record Number: [id number] Patient Account Number: [id number] Date of Birth/Sex: 1950/12/07 (68 y.o. F) Treating RN: Cornell Barman Primary Care Provider: Tracie Harrier Other Clinician: Referring Provider: Tracie Harrier Treating Provider/Extender: Melburn Hake, Deandre Brannan Weeks in Treatment: 9 Information Obtained From Patient Constitutional Symptoms (General Health) Complaints and Symptoms: Negative for: Fatigue; Fever; Chills; Marked Weight Change Respiratory Complaints and Symptoms: Negative for: Chronic or frequent coughs; Shortness of Breath Medical History: Negative for: Aspiration; Asthma; Chronic Obstructive Pulmonary Disease (COPD); Pneumothorax; Sleep Apnea; Tuberculosis Past Medical History Notes: hx pulmonary embolism Cardiovascular Complaints and Symptoms: Negative for: Chest pain; LE  edema Medical History: Positive  for: Congestive Heart Failure; Deep Vein Thrombosis; Hypertension Negative for: Angina; Arrhythmia; Coronary Artery Disease; Hypotension; Myocardial Infarction; Peripheral Arterial Disease; Peripheral Venous Disease; Phlebitis; Vasculitis Psychiatric Complaints and Symptoms: Negative for: Anxiety; Claustrophobia Endocrine Medical History: Positive for: Type II Diabetes Negative for: Type I Diabetes Time with diabetes: 5 years Treated with: Oral agents Blood sugar tested every day: Yes Tested : QD Integumentary (Skin) Medical History: Negative for: History of Burn; History of pressure wounds Musculoskeletal JACKSON-Been, Ladonne K. ([id number]) Medical History: Positive for: Osteoarthritis Negative for: Gout; Rheumatoid Arthritis; Osteomyelitis Past Medical History Notes: OP Neurologic Medical History: Positive for: Neuropathy Negative for: Dementia; Quadriplegia; Paraplegia; Seizure Disorder Immunizations Pneumococcal Vaccine: Received Pneumococcal Vaccination: Yes Implantable Devices None Family and Social History Cancer: No; Diabetes: Yes - Siblings; Heart Disease: No; Hereditary Spherocytosis: No; Hypertension: No; Kidney Disease: No; Lung Disease: No; Seizures: No; Stroke: No; Thyroid Problems: No; Tuberculosis: No; Never smoker; Marital Status - Married; Alcohol Use: Never; Drug Use: No History; Caffeine Use: Daily; Financial Concerns: No; Food, Clothing or Shelter Needs: No; Support System Lacking: No; Transportation Concerns: No Physician Affirmation I have reviewed and agree with the above information. Electronic Signature(s) Signed: 03/08/2019 5:55:45 PM By: Gretta Cool, BSN, RN, CWS, Kim RN, BSN Signed: 03/08/2019 6:39:08 PM By: Worthy Keeler PA-C Entered By: Worthy Keeler on 03/08/2019 09:37:19 JACKSON-Grotz, Ariyah K. ([id number]) -------------------------------------------------------------------------------- SuperBill  Details Patient Name: JACKSON-Myung, Sindia K. Date of Service: 03/08/2019 Medical Record Number: [id number] Patient Account Number: [id number] Date of Birth/Sex: Dec 16, 1950 (68 y.o. 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 L02.416 Cutaneous abscess of left lower limb Facility Procedures CPT4 Code: [id number] Description: 99213 - WOUND CARE VISIT-LEV 3 EST PT Modifier: Quantity: 1 Physician Procedures CPT4 Code Description: [id number] 99214 - WC PHYS LEVEL 4 - EST PT ICD-10 Diagnosis Description E11.9 Type 2 diabetes mellitus without complications I68.032 Non-pressure chronic ulcer of left calf with fat layer exposed L97.812 Non-pressure chronic ulcer of  other part of right lower leg wi L02.416 Cutaneous abscess of left lower limb Modifier: th fat layer expo Quantity: 1 sed Electronic Signature(s) Signed: 03/08/2019 9:38:44 AM By: Worthy Keeler PA-C Entered By: Worthy Keeler on 03/08/2019 09:38:44

## 2019-03-08 NOTE — Progress Notes (Signed)
Safety precautions to be maintained throughout the outpatient stay will include: orient to surroundings, keep bed in low position, maintain call bell within reach at all times, provide assistance with transfer out of bed and ambulation.  

## 2019-03-09 ENCOUNTER — Telehealth: Payer: Self-pay | Admitting: *Deleted

## 2019-03-09 NOTE — Telephone Encounter (Signed)
Spoke with patient re; procedure on yesterday no questions or concerns.

## 2019-03-09 NOTE — Progress Notes (Signed)
Kara [name], Kara [name] ([id number]) Visit Report for 03/08/2019 Arrival Information Details Patient Name: Kara [name], [name] Date of Service: 03/08/2019 9:00 AM Medical Record Number: [id number] Patient Account Number: [id number] Date of Birth/Sex: Oct 11, 1950 (68 y.o. F) Treating RN: Montey Hora Primary Care Anatole Apollo: Tracie Harrier Other Clinician: Referring Viveka Wilmeth: Tracie Harrier Treating Carmita Boom/Extender: Melburn Hake, HOYT Weeks in Treatment: 9 Visit Information History Since Last Visit Added or deleted any medications: Yes Patient Arrived: Cane Any new allergies or adverse reactions: No Arrival Time: 09:02 Had a fall or experienced change in No Accompanied By: self activities of daily living that may affect Transfer Assistance: None risk of falls: Patient Identification Verified: Yes Signs or symptoms of abuse/neglect since last visito No Secondary Verification Process Yes Hospitalized since last visit: No Completed: Implantable device outside of the clinic excluding No Patient Has Alerts: Yes cellular tissue based products placed in the center Patient Alerts: Patient on Blood since last visit: Thinner Has Dressing in Place as Prescribed: Yes Warfarin Has Compression in Place as Prescribed: Yes DMII Pain Present Now: No Electronic Signature(s) Signed: 03/08/2019 5:17:59 PM By: Montey Hora Entered By: Montey Hora on 03/08/2019 09:04:07 Kara [name], [name] ([id number]) -------------------------------------------------------------------------------- Clinic Level of Care Assessment Details Patient Name: Kara [name], [name] Date of Service: 03/08/2019 9:00 AM Medical Record Number: [id number] Patient Account Number: [id number] Date of Birth/Sex: 1950-06-17 (68 y.o. F) Treating RN: Cornell Barman Primary Care Kawon Willcutt: Tracie Harrier Other Clinician: Referring Onetha Gaffey: Tracie Harrier Treating Danialle Dement/Extender: Melburn Hake, HOYT Weeks  in Treatment: 9 Clinic Level of Care Assessment Items TOOL 4 Quantity Score []  - Use when only an EandM is performed on FOLLOW-UP visit 0 ASSESSMENTS - Nursing Assessment / Reassessment X - Reassessment of Co-morbidities (includes updates in patient status) 1 10 X- 1 5 Reassessment of Adherence to Treatment Plan ASSESSMENTS - Wound and Skin Assessment / Reassessment X - Simple Wound Assessment / Reassessment - one wound 1 5 []  - 0 Complex Wound Assessment / Reassessment - multiple wounds []  - 0 Dermatologic / Skin Assessment (not related to wound area) ASSESSMENTS - Focused Assessment []  - Circumferential Edema Measurements - multi extremities 0 []  - 0 Nutritional Assessment / Counseling / Intervention []  - 0 Lower Extremity Assessment (monofilament, tuning fork, pulses) []  - 0 Peripheral Arterial Disease Assessment (using hand held doppler) ASSESSMENTS - Ostomy and/or Continence Assessment and Care []  - Incontinence Assessment and Management 0 []  - 0 Ostomy Care Assessment and Management (repouching, etc.) PROCESS - Coordination of Care X - Simple Patient / Family Education for ongoing care 1 15 []  - 0 Complex (extensive) Patient / Family Education for ongoing care []  - 0 Staff obtains Programmer, systems, Records, Test Results / Process Orders []  - 0 Staff telephones HHA, Nursing Homes / Clarify orders / etc []  - 0 Routine Transfer to another Facility (non-emergent condition) []  - 0 Routine Hospital Admission (non-emergent condition) []  - 0 New Admissions / Biomedical engineer / Ordering NPWT, Apligraf, etc. []  - 0 Emergency Hospital Admission (emergent condition) X- 1 10 Simple Discharge Coordination JACKSON-Mccard, Amariyana K. ([id number]) []  - 0 Complex (extensive) Discharge Coordination PROCESS - Special Needs []  - Pediatric / Minor Patient Management 0 []  - 0 Isolation Patient Management []  - 0 Hearing / Language / Visual special needs []  - 0 Assessment of  Community assistance (transportation, D/C planning, etc.) []  - 0 Additional assistance / Altered mentation []  - 0 Support Surface(s) Assessment (bed, cushion, seat, etc.) INTERVENTIONS - Wound Cleansing / Measurement X - Simple Wound Cleansing -  one wound 1 5 []  - 0 Complex Wound Cleansing - multiple wounds X- 1 5 Wound Imaging (photographs - any number of wounds) []  - 0 Wound Tracing (instead of photographs) X- 1 5 Simple Wound Measurement - one wound []  - 0 Complex Wound Measurement - multiple wounds INTERVENTIONS - Wound Dressings []  - Small Wound Dressing one or multiple wounds 0 X- 1 15 Medium Wound Dressing one or multiple wounds []  - 0 Large Wound Dressing one or multiple wounds []  - 0 Application of Medications - topical []  - 0 Application of Medications - injection INTERVENTIONS - Miscellaneous []  - External ear exam 0 []  - 0 Specimen Collection (cultures, biopsies, blood, body fluids, etc.) []  - 0 Specimen(s) / Culture(s) sent or taken to Lab for analysis []  - 0 Patient Transfer (multiple staff / Civil Service fast streamer / Similar devices) []  - 0 Simple Staple / Suture removal (25 or less) []  - 0 Complex Staple / Suture removal (26 or more) []  - 0 Hypo / Hyperglycemic Management (close monitor of Blood Glucose) []  - 0 Ankle / Brachial Index (ABI) - do not check if billed separately X- 1 5 Vital Signs JACKSON-Zalenski, Manjit K. ([id number]) Has the patient been seen at the hospital within the last three years: Yes Total Score: 80 Level Of Care: New/Established - Level 3 Electronic Signature(s) Signed: 03/08/2019 5:55:45 PM By: Gretta Cool, BSN, RN, CWS, Kim RN, BSN Entered By: Gretta Cool, BSN, RN, CWS, Kim on 03/08/2019 09:36:33 Kara [name], [name] ([id number]) -------------------------------------------------------------------------------- Encounter Discharge Information Details Patient Name: Kara [name], [name] Date of Service: 03/08/2019 9:00 AM Medical Record  Number: [id number] Patient Account Number: [id number] Date of Birth/Sex: 29-Sep-1950 (68 y.o. F) Treating RN: Cornell Barman Primary Care Lockie Bothun: Tracie Harrier Other Clinician: Referring Ajiah Mcglinn: Tracie Harrier Treating Alonia Dibuono/Extender: Melburn Hake, HOYT Weeks in Treatment: 9 Encounter Discharge Information Items Discharge Condition: Stable Ambulatory Status: Ambulatory Discharge Destination: Home Transportation: Private Auto Accompanied By: self Schedule Follow-up Appointment: Yes Clinical Summary of Care: Electronic Signature(s) Signed: 03/08/2019 5:55:45 PM By: Gretta Cool, BSN, RN, CWS, Kim RN, BSN Entered By: Gretta Cool, BSN, RN, CWS, Kim on 03/08/2019 09:37:27 JACKSON-Holleman, Brieanne K. ([id number]) -------------------------------------------------------------------------------- Lower Extremity Assessment Details Patient Name: JACKSON-Reeves, Kaylina K. Date of Service: 03/08/2019 9:00 AM Medical Record Number: [id number] Patient Account Number: [id number] Date of Birth/Sex: 09/13/1950 (68 y.o. F) Treating RN: Montey Hora Primary Care Kourtney Montesinos: Tracie Harrier Other Clinician: Referring Parys Elenbaas: Tracie Harrier Treating Nikolaos Maddocks/Extender: STONE III, HOYT Weeks in Treatment: 9 Edema Assessment Assessed: [Left: No] [Right: No] Edema: [Left: Yes] [Right: Yes] Vascular Assessment Pulses: Dorsalis Pedis Palpable: [Left:Yes] [Right:Yes] Electronic Signature(s) Signed: 03/08/2019 5:17:59 PM By: Montey Hora Entered By: Montey Hora on 03/08/2019 09:14:37 JACKSON-Vandermeer, Akiah K. ([id number]) -------------------------------------------------------------------------------- Multi Wound Chart Details Patient Name: JACKSON-Tetrick, Azzie K. Date of Service: 03/08/2019 9:00 AM Medical Record Number: [id number] Patient Account Number: [id number] Date of Birth/Sex: 27-Oct-1950 (68 y.o. F) Treating RN: Cornell Barman Primary Care Rion Schnitzer: Tracie Harrier Other Clinician: Referring  Zelpha Messing: Tracie Harrier Treating Auden Wettstein/Extender: Melburn Hake, HOYT Weeks in Treatment: 9 Vital Signs Height(in): 63 Pulse(bpm): 75 Weight(lbs): 227 Blood Pressure(mmHg): 131/62 Body Mass Index(BMI): 40 Temperature(F): 98.3 Respiratory Rate 18 (breaths/min): Photos: Wound Location: Left Lower Leg - Anterior Right Lower Leg - Lateral Left Lower Leg - Medial, Posterior Wounding Event: Trauma Gradually Appeared Gradually Appeared Primary Etiology: Diabetic Wound/Ulcer of the Diabetic Wound/Ulcer of the Diabetic Wound/Ulcer of the Lower Extremity Lower Extremity Lower Extremity Secondary Etiology: Trauma, Other N/A N/A Comorbid History: Congestive Heart Failure, Congestive Heart Failure, Congestive  Heart Failure, Deep Vein Thrombosis, Deep Vein Thrombosis, Deep Vein Thrombosis, Hypertension, Type II Hypertension, Type II Hypertension, Type II Diabetes, Osteoarthritis, Diabetes, Osteoarthritis, Diabetes, Osteoarthritis, Neuropathy Neuropathy Neuropathy Date Acquired: 12/21/2018 01/30/2019 02/09/2019 Weeks of Treatment: 9 5 3  Wound Status: Open Open Open Clustered Wound: No No Yes Clustered Quantity: N/A N/A 2 Measurements L x W x D 0.7x0.2x0.1 0.3x0.3x0.1 0.9x0.7x0.1 (cm) Area (cm) : 0.11 0.071 0.495 Volume (cm) : 0.011 0.007 0.049 % Reduction in Area: 98.00% 63.80% 70.00% % Reduction in Volume: 99.70% 65.00% 70.30% Classification: Grade 1 Grade 1 Grade 1 Exudate Amount: Large Large Large Exudate Type: Serosanguineous Serous Serosanguineous Exudate Color: red, brown amber red, brown Wound Margin: Flat and Intact Distinct, outline attached Distinct, outline attached Granulation Amount: Large (67-100%) Large (67-100%) Large (67-100%) Granulation Quality: Pink, Hyper-granulation Pink Hyper-granulation JACKSON-Crear, Catheline K. ([id number]) Necrotic Amount: Small (1-33%) None Present (0%) Small (1-33%) Exposed Structures: Fat Layer (Subcutaneous Fat Layer (Subcutaneous Fat  Layer (Subcutaneous Tissue) Exposed: Yes Tissue) Exposed: Yes Tissue) Exposed: Yes Fascia: No Fascia: No Fascia: No Tendon: No Tendon: No Tendon: No Muscle: No Muscle: No Muscle: No Joint: No Joint: No Joint: No Bone: No Bone: No Bone: No Epithelialization: Small (1-33%) None None Treatment Notes Electronic Signature(s) Signed: 03/08/2019 5:55:45 PM By: Gretta Cool, BSN, RN, CWS, Kim RN, BSN Entered By: Gretta Cool, BSN, RN, CWS, Kim on 03/08/2019 09:33:54 Kara [name], [name] ([id number]) -------------------------------------------------------------------------------- Multi-Disciplinary Care Plan Details Patient Name: Kara [name], [name] Date of Service: 03/08/2019 9:00 AM Medical Record Number: [id number] Patient Account Number: [id number] Date of Birth/Sex: February 02, 1951 (68 y.o. F) Treating RN: Cornell Barman Primary Care Kamie Korber: Tracie Harrier Other Clinician: Referring Neila Teem: Tracie Harrier Treating Daylen Hack/Extender: Melburn Hake, HOYT Weeks in Treatment: 9 Active Inactive Abuse / Safety / Falls / Self Care Management Nursing Diagnoses: History of Falls Goals: Patient will not experience any injury related to falls Date Initiated: 01/03/2019 Target Resolution Date: 01/17/2019 Goal Status: Active Interventions: Assess fall risk on admission and as needed Notes: Medication Nursing Diagnoses: Knowledge deficit related to medication safety: actual or potential Goals: Patient/caregiver will demonstrate understanding of all current medications Date Initiated: 01/03/2019 Target Resolution Date: 01/03/2019 Goal Status: Active Interventions: Patient/Caregiver given reconciled medication list upon admission, changes in medications and discharge from the [facility] Notes: Necrotic Tissue Nursing Diagnoses: Impaired tissue integrity related to necrotic/devitalized tissue Goals: Necrotic/devitalized tissue will be minimized in the wound bed Date Initiated:  01/03/2019 Target Resolution Date: 01/10/2019 Goal Status: Active Interventions: JACKSON-Lavell, Hanh K. ([id number]) Assess patient pain level pre-, during and post procedure and prior to discharge Treatment Activities: Apply topical anesthetic as ordered : 01/03/2019 Excisional debridement : 01/03/2019 Notes: Wound/Skin Impairment Nursing Diagnoses: Impaired tissue integrity Goals: Ulcer/skin breakdown will have a volume reduction of 30% by week 4 Date Initiated: 01/03/2019 Target Resolution Date: 02/03/2019 Goal Status: Active Interventions: Assess patient/caregiver ability to obtain necessary supplies Assess ulceration(s) every visit Treatment Activities: Skin care regimen initiated : 01/03/2019 Topical wound management initiated : 01/03/2019 Notes: Electronic Signature(s) Signed: 03/08/2019 5:55:45 PM By: Gretta Cool, BSN, RN, CWS, Kim RN, BSN Entered By: Gretta Cool, BSN, RN, CWS, Kim on 03/08/2019 09:33:30 Kara [name], [name] ([id number]) -------------------------------------------------------------------------------- Pain Assessment Details Patient Name: Kara [name], [name] Date of Service: 03/08/2019 9:00 AM Medical Record Number: [id number] Patient Account Number: [id number] Date of Birth/Sex: [birth year] (68 y.o. F) Treating RN: Montey Hora Primary Care Mega Kinkade: Tracie Harrier Other Clinician: Referring Shepard Keltz: Tracie Harrier Treating Holden Draughon/Extender: Melburn Hake, HOYT Weeks in Treatment: 9 Active Problems Location of Pain Severity and Description  of Pain Patient Has Paino No Site Locations Pain Management and Medication Current Pain Management: Electronic Signature(s) Signed: 03/08/2019 5:17:59 PM By: Montey Hora Entered By: Montey Hora on 03/08/2019 09:03:27 JACKSON-Hooser, Nataki K. ([id number]) -------------------------------------------------------------------------------- Patient/Caregiver Education Details Patient Name: JACKSON-Samonte,  Shanell K. Date of Service: 03/08/2019 9:00 AM Medical Record Number: [id number] Patient Account Number: [id number] Date of Birth/Gender: [birth year] (68 y.o. F) Treating RN: Cornell Barman Primary Care Physician: Tracie Harrier Other Clinician: Referring Physician: Tracie Harrier Treating Physician/Extender: Sharalyn Ink in Treatment: 9 Education Assessment Education Provided To: Patient Education Topics Provided Wound/Skin Impairment: Handouts: Caring for Your Ulcer Methods: Demonstration, Explain/Verbal Responses: State content correctly Electronic Signature(s) Signed: 03/08/2019 5:55:45 PM By: Gretta Cool, BSN, RN, CWS, Kim RN, BSN Entered By: Gretta Cool, BSN, RN, CWS, Kim on 03/08/2019 09:36:51 Ocilla, Kathryn K. ([id number]) -------------------------------------------------------------------------------- Wound Assessment Details Patient Name: JACKSON-Janowski, Alexine K. Date of Service: 03/08/2019 9:00 AM Medical Record Number: [id number] Patient Account Number: [id number] Date of Birth/Sex: 02-06-1951 (68 y.o. F) Treating RN: Montey Hora Primary Care Rodnisha Blomgren: Tracie Harrier Other Clinician: Referring Devon Kingdon: Tracie Harrier Treating Cassi Jenne/Extender: STONE III, HOYT Weeks in Treatment: 9 Wound Status Wound Number: 1 Primary Diabetic Wound/Ulcer of the Lower Extremity Etiology: Wound Location: Left Lower Leg - Anterior Secondary Trauma, Other Wounding Event: Trauma Etiology: Date Acquired: 12/21/2018 Wound Open Weeks Of Treatment: 9 Status: Clustered Wound: No Comorbid Congestive Heart Failure, Deep Vein History: Thrombosis, Hypertension, Type II Diabetes, Osteoarthritis, Neuropathy Photos Wound Measurements Length: (cm) 0.7 Width: (cm) 0.2 Depth: (cm) 0.1 Area: (cm) 0.11 Volume: (cm) 0.011 % Reduction in Area: 98% % Reduction in Volume: 99.7% Epithelialization: Small (1-33%) Tunneling: No Undermining: No Wound Description Classification:  Grade 1 Foul Odor Wound Margin: Flat and Intact Slough/Fib Exudate Amount: Large Exudate Type: Serosanguineous Exudate Color: red, brown After Cleansing: No rino Yes Wound Bed Granulation Amount: Large (67-100%) Exposed Structure Granulation Quality: Pink, Hyper-granulation Fascia Exposed: No Necrotic Amount: Small (1-33%) Fat Layer (Subcutaneous Tissue) Exposed: Yes Necrotic Quality: Adherent Slough Tendon Exposed: No Muscle Exposed: No Joint Exposed: No Bone Exposed: No JACKSON-Kochel, Armanda K. ([id number]) Treatment Notes Wound #1 (Left, Anterior Lower Leg) Notes prisma, ABD conform BFD to right leg, tubi grip F bilateral Electronic Signature(s) Signed: 03/08/2019 5:17:59 PM By: Montey Hora Entered By: Montey Hora on 03/08/2019 09:12:19 JACKSON-Heine, Blonnie K. ([id number]) -------------------------------------------------------------------------------- Wound Assessment Details Patient Name: JACKSON-Lwin, Kaleeya K. Date of Service: 03/08/2019 9:00 AM Medical Record Number: [id number] Patient Account Number: [id number] Date of Birth/Sex: 07-25-1950 (68 y.o. F) Treating RN: Montey Hora Primary Care Caius Silbernagel: Tracie Harrier Other Clinician: Referring Viktor Philipp: Tracie Harrier Treating Bethanne Mule/Extender: STONE III, HOYT Weeks in Treatment: 9 Wound Status Wound Number: 2 Primary Diabetic Wound/Ulcer of the Lower Extremity Etiology: Wound Location: Right Lower Leg - Lateral Wound Open Wounding Event: Gradually Appeared Status: Date Acquired: 01/30/2019 Comorbid Congestive Heart Failure, Deep Vein Weeks Of Treatment: 5 History: Thrombosis, Hypertension, Type II Diabetes, Clustered Wound: No Osteoarthritis, Neuropathy Photos Wound Measurements Length: (cm) 0.3 Width: (cm) 0.3 Depth: (cm) 0.1 Area: (cm) 0.071 Volume: (cm) 0.007 % Reduction in Area: 63.8% % Reduction in Volume: 65% Epithelialization: None Tunneling: No Undermining: No Wound  Description Classification: Grade 1 Foul Odor A Wound Margin: Distinct, outline attached Slough/Fibr Exudate Amount: Large Exudate Type: Serous Exudate Color: amber fter Cleansing: No ino No Wound Bed Granulation Amount: Large (67-100%) Exposed Structure Granulation Quality: Pink Fascia Exposed: No Necrotic Amount: None Present (0%) Fat Layer (Subcutaneous Tissue) Exposed: Yes Tendon Exposed: No Muscle Exposed: No Joint Exposed: No  Bone Exposed: No Treatment Notes JACKSON-Irving, Shakeema K. ([id number]) Wound #2 (Right, Lateral Lower Leg) Notes prisma, ABD conform BFD to right leg, tubi grip F bilateral Electronic Signature(s) Signed: 03/08/2019 5:17:59 PM By: Montey Hora Entered By: Montey Hora on 03/08/2019 09:13:31 JACKSON-Esqueda, Pascuala K. ([id number]) -------------------------------------------------------------------------------- Wound Assessment Details Patient Name: JACKSON-Mehlhoff, Shatasia K. Date of Service: 03/08/2019 9:00 AM Medical Record Number: [id number] Patient Account Number: [id number] Date of Birth/Sex: [birth year] (68 y.o. F) Treating RN: Montey Hora Primary Care Zakirah Weingart: Tracie Harrier Other Clinician: Referring Julita Ozbun: Tracie Harrier Treating Paolina Karwowski/Extender: STONE III, HOYT Weeks in Treatment: 9 Wound Status Wound Number: 3 Primary Diabetic Wound/Ulcer of the Lower Extremity Etiology: Wound Location: Left Lower Leg - Medial, Posterior Wound Open Wounding Event: Gradually Appeared Status: Date Acquired: 02/09/2019 Comorbid Congestive Heart Failure, Deep Vein Weeks Of Treatment: 3 History: Thrombosis, Hypertension, Type II Diabetes, Clustered Wound: Yes Osteoarthritis, Neuropathy Photos Wound Measurements Length: (cm) 0.9 % Reduc Width: (cm) 0.7 % Reduc Depth: (cm) 0.1 Epithel Clustered Quantity: 2 Tunneli Area: (cm) 0.495 Underm Volume: (cm) 0.049 tion in Area: 70% tion in Volume: 70.3% ialization: None ng: No ining:  No Wound Description Classification: Grade 1 Wound Margin: Distinct, outline attached Exudate Amount: Large Exudate Type: Serosanguineous Exudate Color: red, brown Foul Odor After Cleansing: No Slough/Fibrino Yes Wound Bed Granulation Amount: Large (67-100%) Exposed Structure Granulation Quality: Hyper-granulation Fascia Exposed: No Necrotic Amount: Small (1-33%) Fat Layer (Subcutaneous Tissue) Exposed: Yes Necrotic Quality: Adherent Slough Tendon Exposed: No Muscle Exposed: No Joint Exposed: No Bone Exposed: No JACKSON-Piccirilli, Kambrie K. ([id number]) Treatment Notes Wound #3 (Left, Medial, Posterior Lower Leg) Notes prisma, ABD conform BFD to right leg, tubi grip F bilateral Electronic Signature(s) Signed: 03/08/2019 5:17:59 PM By: Montey Hora Entered By: Montey Hora on 03/08/2019 09:14:20 JACKSON-Amory, Camielle K. ([id number]) -------------------------------------------------------------------------------- Vitals Details Patient Name: [name], Zorianna K. Date of Service: 03/08/2019 9:00 AM Medical Record Number: [id number] Patient Account Number: [id number] Date of Birth/Sex: [birth year] (68 y.o. F) Treating RN: Montey Hora Primary Care Muskaan Smet: Tracie Harrier Other Clinician: Referring Dusty Wagoner: Tracie Harrier Treating Telford Archambeau/Extender: Melburn Hake, HOYT Weeks in Treatment: 9 Vital Signs Time Taken: 09:14 Temperature (F): 98.3 Height (in): 63 Pulse (bpm): 75 Weight (lbs): 227 Respiratory Rate (breaths/min): 18 Body Mass Index (BMI): 40.2 Blood Pressure (mmHg): 131/62 Reference Range: 80 - 120 mg / dl Electronic Signature(s) Signed: 03/08/2019 5:17:59 PM By: Montey Hora Entered By: Montey Hora on 03/08/2019 09:15:00

## 2019-03-13 ENCOUNTER — Ambulatory Visit: Payer: PPO | Admitting: Pain Medicine

## 2019-03-15 ENCOUNTER — Other Ambulatory Visit: Payer: Self-pay

## 2019-03-15 ENCOUNTER — Encounter: Payer: PPO | Admitting: Physician Assistant

## 2019-03-15 DIAGNOSIS — L97821 Non-pressure chronic ulcer of other part of left lower leg limited to breakdown of skin: Secondary | ICD-10-CM | POA: Diagnosis not present

## 2019-03-15 DIAGNOSIS — L97811 Non-pressure chronic ulcer of other part of right lower leg limited to breakdown of skin: Secondary | ICD-10-CM | POA: Diagnosis not present

## 2019-03-15 DIAGNOSIS — L97222 Non-pressure chronic ulcer of left calf with fat layer exposed: Secondary | ICD-10-CM | POA: Diagnosis not present

## 2019-03-15 DIAGNOSIS — S81802A Unspecified open wound, left lower leg, initial encounter: Secondary | ICD-10-CM | POA: Diagnosis not present

## 2019-03-16 NOTE — Progress Notes (Signed)
BERKLEY, WRIGHTSMAN ([id number]) Visit Report for 03/15/2019 Chief Complaint Document Details Patient Name: [name], [name] Date of Service: 03/15/2019 9:15 AM Medical Record Number: [id number] Patient Account Number: [id number] Date of Birth/Sex: [birth year] (68 y.o. F) Treating RN: Army Melia Primary Care Provider: Tracie Harrier Other Clinician: Referring Provider: Tracie Harrier Treating Provider/Extender: Melburn Hake, Kycen Spalla Weeks in Treatment: 10 Information Obtained from: Patient Chief Complaint Left Leg ulcer x 2 weeks Electronic Signature(s) Signed: 03/15/2019 9:31:29 AM By: Worthy Keeler PA-C Entered By: Worthy Keeler on 03/15/2019 09:31:28 [name], [name] ([id number]) -------------------------------------------------------------------------------- HPI Details Patient Name: JACKSON-Kroft, Mekia K. Date of Service: 03/15/2019 9:15 AM Medical Record Number: [id number] Patient Account Number: [id number] Date of Birth/Sex: [birth year] (68 y.o. F) Treating RN: Army Melia Primary Care Provider: Tracie Harrier Other Clinician: Referring Provider: Tracie Harrier Treating Provider/Extender: Melburn Hake, Ross Hefferan Weeks in Treatment: 10 History of Present Illness HPI Description: Pleasant 68 year old Caucasian female who fell down a few steps in her home 2 weeks ago and developed bruises on both legs on the shins, on the left shin area she did have an open area that scabbed, patient did ED on 8/1 at which point she had dressing applied to the left shin wound, she had a right shin hematoma that was lanced with oozing of blood after. Since that time patient has noticed that the left calf leg wound developed a black covering on top and had started to hurt, she has been dressing this daily with an antibiotic ointment and gauze wrap. 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 She had a traumatic wound on the left lower extremity. We have been using silver alginate because of drainage. She has been in compression 8/26; this was initially a traumatic wound on the left lower extremity. It has some depth superiorly appears to have come in this week. We have been using silver alginate because of drainage 01/24/19 once again the wrap seems to have come down and she took it off and put Vaseline on the wound on Sunday. She did not call the clinic. About 75% of this is fully granulated 25% is not and that is superiorly. We have been using silver alginate but all changed to silver collagen today. 02/01/19 on evaluation today patient appears to be doing okay in regard to her left anterior lower extremity ulcer. She also has a right lower leg ulcer which really has not been monitored as much at this point due to the fact that it appeared to be doing quite well according to what that [facility] felt. Nonetheless there appears to be some fluid that is leaking from the right side as well upon evaluation today. I think this may need to be addressed at this point unfortunately. I don't think it's just gonna heal on its own at least not very effectively. No fevers, chills, nausea, or vomiting noted at this time. 02/09/2019 today patient appears to be doing  a little better in regard to her left lower extremity. On the right lower extremity she actually does have some necrotic tissue noted at this point. Both areas do seem to be draining quite  a bit at this time. There is no sign of active infection at this point. 02/15/2019 on evaluation today patient unfortunately is having some issues today with a abscess on the medial aspect of her left lower extremity which is new since last week. She states that this actually occurred on Friday she had a blister initially that she took her wrap off and tried to pop. Subsequently since that time it is gotten worse become more painful and appears to be much more infected. There does not appear to be any signs of active systemic infection which is good news. No fever chills noted. She actually called into the office on Tuesday at which point I sent in a prescription for Keflex until she came in today for evaluation. Apparently she called yesterday as well asking what she can do about the pain and was told to take Tylenol as well. Currently she states that this unfortunately is exquisitely tender even to light touch. Unfortunately I do believe this is a much deeper abscess that is even apparent she has a lot of necrotic/hyper granular tissue noted on the surface of the wound. 02/23/2019 upon evaluation today patient actually appears to be doing slightly better compared to last evaluation. I did review her wound culture today which showed that she was positive for MRSA. This is sensitive to clindamycin and tetracycline orally only. Fortunately there is no signs of active infection at this time systemically which is good news. No fevers, chills, nausea, vomiting, or diarrhea. 03/01/2019 on evaluation today patient appears to be doing well with regard to her lower extremity wounds. Her infection seems to be under much better control than previously noted which is excellent news. With that being said I still feel like that [place], Condon. ([id number]) though she is doing a lot better she still has a little better time to go as far as getting these wounds to completely close. Fortunately there is no  signs of active infection at this time. No fever chills noted. 03/08/2019 on evaluation today patient appears to be doing excellent with regard to her wounds. All are measuring significantly smaller which is great news and I do believe she is doing quite well. Fortunately there is no evidence of active infection at this time. No fevers, chills, nausea, vomiting, or diarrhea. 03/15/2019 on evaluation today patient actually appears to be doing very well with regard to her wounds. She has been tolerating the dressing changes without complication. Fortunately her wounds are showing signs of healing in fact to the areas have completely healed the other is doing significantly better. Overall I am extremely pleased with the progress she is made. Electronic Signature(s) Signed: 03/15/2019 6:27:16 PM By: Worthy Keeler PA-C Entered By: Worthy Keeler on 03/15/2019 18:27:16 [name], [name] ([id number]) -------------------------------------------------------------------------------- Physical Exam Details Patient Name: JACKSON-Milam, Else K. Date of Service: 03/15/2019 9:15 AM Medical Record Number: [id number] Patient Account Number: [id number] Date of Birth/Sex: 02-04-1951 (68 y.o. F) Treating RN: Army Melia Primary Care Provider: Tracie Harrier Other Clinician: Referring Provider: Tracie Harrier Treating Provider/Extender: STONE III, Jameson Tormey Weeks in Treatment: 10 Constitutional Well-nourished and well-hydrated in no acute distress. Respiratory normal breathing without difficulty. clear to auscultation bilaterally. Cardiovascular regular rate and rhythm with normal S1, S2. Psychiatric this patient is able to make decisions  and demonstrates good insight into disease process. Alert and Oriented x 3. pleasant and cooperative. Notes Patient's wounds currently again did not require any sharp debridement which is excellent news overall I am happy with the progress she has made. No  fevers, chills, nausea, vomiting, or diarrhea. There was no sharp debridement performed today. Electronic Signature(s) Signed: 03/15/2019 6:27:47 PM By: Worthy Keeler PA-C Entered By: Worthy Keeler on 03/15/2019 18:27:47 [name], [name] ([id number]) -------------------------------------------------------------------------------- Physician Orders Details Patient Name: JACKSON-Stracke, Lawanna K. Date of Service: 03/15/2019 9:15 AM Medical Record Number: [id number] Patient Account Number: [id number] Date of Birth/Sex: [birth year] (68 y.o. F) Treating RN: Army Melia Primary Care Provider: Tracie Harrier Other Clinician: Referring Provider: Tracie Harrier Treating Provider/Extender: Melburn Hake, Markail Diekman Weeks in Treatment: 10 Verbal / Phone Orders: No Diagnosis Coding Wound Cleansing Wound #3 Left,Medial,Posterior Lower Leg o May shower with protection. o Other: - wash head to toe with Hibiclense once daily for 1 week Anesthetic (add to Medication List) Wound #3 Left,Medial,Posterior Lower Leg o Topical Lidocaine 4% cream applied to wound bed prior to debridement (In Clinic Only). Primary Wound Dressing Wound #3 Left,Medial,Posterior Lower Leg o Silver Collagen Secondary Dressing Wound #3 Left,Medial,Posterior Lower Leg o Dry Gauze - secure with paper tape Dressing Change Frequency Wound #3 Left,Medial,Posterior Lower Leg o Change dressing every other day. Follow-up Appointments Wound #3 Left,Medial,Posterior Lower Leg o Return Appointment in 2 weeks. o Nurse Visit as needed Edema Control Wound #3 Left,Medial,Posterior Lower Leg o Elevate legs to the level of the heart and pump ankles as often as possible Medications-please add to medication list. o Topical Antibiotic - mupirocin - once weekly, put a small amount on the tips of your fingers and in each nostril Electronic Signature(s) Signed: 03/15/2019 6:36:47 PM By: Worthy Keeler  PA-C Previous Signature: 03/15/2019 1:28:54 PM Version By: Army Melia Entered By: Worthy Keeler on 03/15/2019 18:28:10 [name], [name] ([id number]) -------------------------------------------------------------------------------- Problem List Details Patient Name: JACKSON-Zhen, Banesa K. Date of Service: 03/15/2019 9:15 AM Medical Record Number: [id number] Patient Account Number: [id number] Date of Birth/Sex: 1950-10-08 (68 y.o. F) Treating RN: Army Melia Primary Care Provider: Tracie Harrier Other Clinician: Referring Provider: Tracie Harrier Treating Provider/Extender: Melburn Hake, Azalie Harbeck Weeks in Treatment: 10 Active Problems ICD-10 Evaluated Encounter Code Description Active Date Today Diagnosis E11.9 Type 2 diabetes mellitus without complications 09/17/621 No Yes L97.222 Non-pressure chronic ulcer of left calf with fat layer exposed 01/03/2019 No Yes L97.812 Non-pressure chronic ulcer of other part of right lower leg 02/02/2019 No Yes with fat layer exposed L02.416 Cutaneous abscess of left lower limb 02/15/2019 No Yes Inactive Problems Resolved Problems Electronic Signature(s) Signed: 03/15/2019 9:31:22 AM By: Worthy Keeler PA-C Entered By: Worthy Keeler on 03/15/2019 09:31:21 JACKSON-Gaumond, Shanice K. ([id number]) -------------------------------------------------------------------------------- Progress Note Details Patient Name: JACKSON-Kleckner, Chrystle K. Date of Service: 03/15/2019 9:15 AM Medical Record Number: [id number] Patient Account Number: [id number] Date of Birth/Sex: 06-06-1950 (68 y.o. F) Treating RN: Army Melia Primary Care Provider: Tracie Harrier Other Clinician: Referring Provider: Tracie Harrier Treating Provider/Extender: Melburn Hake, Vianny Schraeder Weeks in Treatment: 10 Subjective Chief Complaint Information obtained from Patient Left Leg ulcer x 2 weeks History of Present Illness (HPI) Pleasant 68 year old Caucasian female who fell  down a few steps in her home 2 weeks ago and developed bruises on both legs on the shins, on the left shin area she did have an open area that scabbed, patient did ED on 8/1 at which point she had dressing applied to the  left shin wound, she had a right shin hematoma that was lanced with oozing of blood after. Since that time patient has noticed that the left calf leg wound developed a black covering on top and had started to hurt, she has been dressing this daily with an antibiotic ointment and gauze wrap. 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 She had a traumatic wound on the left lower extremity. We have been using silver alginate because of drainage. She has been in compression 8/26; this was initially a traumatic wound on the left lower extremity. It has some depth superiorly appears to have come in this week. We have been using silver alginate because of drainage 01/24/19 once again the wrap seems to have come down and she took it off and put Vaseline on the wound on Sunday. She did not call the clinic. About 75% of this is fully granulated 25% is not and that is superiorly. We have been using silver alginate but all changed to silver collagen today. 02/01/19 on evaluation today patient appears to be doing okay in regard to her left anterior lower extremity ulcer. She also has a right lower leg ulcer which really has not been monitored as much at this point  due to the fact that it appeared to be doing quite well according to what that [facility] felt. Nonetheless there appears to be some fluid that is leaking from the right side as well upon evaluation today. I think this may need to be addressed at this point unfortunately. I don't think it's just gonna heal on its own at least not very effectively. No fevers, chills, nausea, or vomiting noted at this time. 02/09/2019 today patient appears to be doing a little better in regard to her left lower extremity. On the right lower extremity she actually does have some necrotic tissue noted at this point. Both areas do seem to be draining quite a bit at this time. There is no sign of active infection at this point. 02/15/2019 on evaluation today patient unfortunately is having some issues today with a abscess on the medial aspect of her left lower extremity which is new since last week. She states that this actually occurred on Friday she had a blister initially that she took her wrap off and tried to pop. Subsequently since that time it is gotten worse become more painful and appears to be much more infected. There does not appear to be any signs of active systemic infection which is good news. No fever chills noted. She actually called into the office on Tuesday at which point I sent in a prescription for Keflex until she came in today for evaluation. Apparently she called yesterday as well asking what she can do about the pain and was told to take Tylenol as well. Currently she states that this unfortunately is exquisitely tender even to light touch. Unfortunately I do believe this is a much deeper  abscess that is even apparent she has a lot of necrotic/hyper granular tissue noted on the surface of the wound. 02/23/2019 upon evaluation today patient actually appears to be doing slightly better compared to last evaluation. I did review her wound culture today which showed that she was positive for MRSA. This is  sensitive to clindamycin and tetracycline orally [name], [name] ([id number]) only. Fortunately there is no signs of active infection at this time systemically which is good news. No fevers, chills, nausea, vomiting, or diarrhea. 03/01/2019 on evaluation today patient appears to be doing well with regard to her lower extremity wounds. Her infection seems to be under much better control than previously noted which is excellent news. With that being said I still feel like that though she is doing a lot better she still has a little better time to go as far as getting these wounds to completely close. Fortunately there is no signs of active infection at this time. No fever chills noted. 03/08/2019 on evaluation today patient appears to be doing excellent with regard to her wounds. All are measuring significantly smaller which is great news and I do believe she is doing quite well. Fortunately there is no evidence of active infection at this time. No fevers, chills, nausea, vomiting, or diarrhea. 03/15/2019 on evaluation today patient actually appears to be doing very well with regard to her wounds. She has been tolerating the dressing changes without complication. Fortunately her wounds are showing signs of healing in fact to the areas have completely healed the other is doing significantly better. Overall I am extremely pleased with the progress she is made. Patient History Information obtained from Patient. Family History Diabetes - Siblings, No family history of Cancer, Heart Disease, Hereditary Spherocytosis, Hypertension, Kidney Disease, Lung Disease, Seizures, Stroke, Thyroid Problems, Tuberculosis. Social History Never smoker, Marital Status - Married, Alcohol Use - Never, Drug Use - No History, Caffeine Use - Daily. Medical History Respiratory Denies history of Aspiration, Asthma, Chronic Obstructive Pulmonary Disease (COPD), Pneumothorax, Sleep  Apnea, Tuberculosis Cardiovascular Patient has history of Congestive Heart Failure, Deep Vein Thrombosis, Hypertension Denies history of Angina, Arrhythmia, Coronary Artery Disease, Hypotension, Myocardial Infarction, Peripheral Arterial Disease, Peripheral Venous Disease, Phlebitis, Vasculitis Endocrine Patient has history of Type II Diabetes Denies history of Type I Diabetes Integumentary (Skin) Denies history of History of Burn, History of pressure wounds Musculoskeletal Patient has history of Osteoarthritis Denies history of Gout, Rheumatoid Arthritis, Osteomyelitis Neurologic Patient has history of Neuropathy Denies history of Dementia, Quadriplegia, Paraplegia, Seizure Disorder Medical And Surgical History Notes Respiratory hx pulmonary embolism Musculoskeletal OP Review of Systems (ROS) Constitutional Symptoms (General Health) Denies complaints or symptoms of Fatigue, Fever, Chills, Marked Weight Change. Respiratory Denies complaints or symptoms of Chronic or frequent coughs, Shortness of Breath. Cardiovascular [name], [name] ([id number]) Denies complaints or symptoms of LE edema. Psychiatric Denies complaints or symptoms of Anxiety, Claustrophobia. Objective Constitutional Well-nourished and well-hydrated in no acute distress. Vitals Time Taken: 9:11 AM, Height: 63 in, Weight: 227 lbs, BMI: 40.2, Temperature: 97.6 F, Pulse: 75 bpm, Respiratory Rate: 16 breaths/min, Blood Pressure: 122/57 mmHg. Respiratory normal breathing without difficulty. clear to auscultation bilaterally. Cardiovascular regular rate and rhythm with normal S1, S2. Psychiatric this patient is able to make decisions and demonstrates good insight into disease process. Alert and Oriented x 3. pleasant and cooperative. General Notes: Patient's wounds currently again did not require any sharp debridement which is excellent news overall I am happy with the progress she has made. No  fevers,  chills, nausea, vomiting, or diarrhea. There was no sharp debridement performed today. Integumentary (Hair, Skin) Wound #1 status is Healed - Epithelialized. Original cause of wound was Trauma. The wound is located on the Left,Anterior Lower Leg. The wound measures 0cm length x 0cm width x 0cm depth; 0cm^2 area and 0cm^3 volume. The wound is limited to skin breakdown. There is no tunneling or undermining noted. There is a large amount of serosanguineous drainage noted. The wound margin is flat and intact. There is no granulation within the wound bed. There is a small (1-33%) amount of necrotic tissue within the wound bed including Eschar. Wound #2 status is Healed - Epithelialized. Original cause of wound was Gradually Appeared. The wound is located on the Right,Lateral Lower Leg. The wound measures 0cm length x 0cm width x 0cm depth; 0cm^2 area and 0cm^3 volume. The wound is limited to skin breakdown. There is no tunneling or undermining noted. There is a none present amount of drainage noted. The wound margin is distinct with the outline attached to the wound base. There is no granulation within the wound bed. There is a large (67-100%) amount of necrotic tissue within the wound bed including Eschar. Wound #3 status is Open. Original cause of wound was Gradually Appeared. The wound is located on the Left,Medial,Posterior Lower Leg. The wound measures 0.7cm length x 0.5cm width x 0.1cm depth; 0.275cm^2 area and 0.027cm^3 volume. There is Fat Layer (Subcutaneous Tissue) Exposed exposed. There is no tunneling or undermining noted. There is a medium amount of serosanguineous drainage noted. The wound margin is distinct with the outline attached to the wound base. There is medium (34-66%) hyper - granulation within the wound bed. There is a medium (34-66%) amount of necrotic tissue within the wound bed including Adherent Slough. [name], [name] 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 Other: - wash head to toe with Hibiclense once daily for 1 week Anesthetic (add to Medication List): Wound #3 Left,Medial,Posterior Lower Leg: Topical Lidocaine 4% cream applied to wound bed prior to debridement (In Clinic Only). Primary Wound Dressing: Wound #3 Left,Medial,Posterior Lower Leg: Silver Collagen Secondary Dressing: Wound #3 Left,Medial,Posterior Lower Leg: Dry Gauze - secure with paper tape Dressing Change Frequency: Wound #3 Left,Medial,Posterior Lower Leg: Change dressing every other day. Follow-up Appointments: Wound #3 Left,Medial,Posterior Lower Leg: Return Appointment in 2 weeks. Nurse Visit as needed Edema Control: Wound #3 Left,Medial,Posterior Lower Leg: Elevate legs to the level of the heart and pump ankles as often as possible Medications-please add to medication list.: Topical Antibiotic - mupirocin - once weekly, put a small amount on the tips of your fingers and in each nostril 1. Upon inspection today patient's wound bed showed signs of again good granulation at this time I am very pleased in this regard. Fortunately there is no evidence of active infection and I am happy about that as well. I am get a recommend at this point that we continue with the collagen dressing as she seems to be doing well and she is very pleased with that. 2. We will also continue just to secure this with dry gauze secured with paper tape which is doing and this seems to cause no irritation for her unfortunately she  does obtain irritation whenever we are using any type of adhesive bandages. 3. I recommend she continue to elevate her legs as  much as possible in order to keep the edema under control. 4. I also recommend she use the Tubigrip which I think is helpful for her still at this point. We will see patient back for reevaluation in 1 week here in the clinic. If anything worsens or changes patient will contact our office for additional recommendations. RASHEL, OKEEFE ([id number]) Electronic Signature(s) Signed: 03/15/2019 6:29:08 PM By: Worthy Keeler PA-C Entered By: Worthy Keeler on 03/15/2019 18:29:08 [name], [name] ([id number]) -------------------------------------------------------------------------------- ROS/PFSH Details Patient Name: JACKSON-Franken, [name] Date of Service: 03/15/2019 9:15 AM Medical Record Number: [id number] Patient Account Number: [id number] Date of Birth/Sex: [birth year] (68 y.o. F) Treating RN: Army Melia Primary Care Provider: Tracie Harrier Other Clinician: Referring Provider: Tracie Harrier Treating Provider/Extender: Melburn Hake, Jaymarie Yeakel Weeks in Treatment: 10 Information Obtained From Patient Constitutional Symptoms (General Health) Complaints and Symptoms: Negative for: Fatigue; Fever; Chills; Marked Weight Change Respiratory Complaints and Symptoms: Negative for: Chronic or frequent coughs; Shortness of Breath Medical History: Negative for: Aspiration; Asthma; Chronic Obstructive Pulmonary Disease (COPD); Pneumothorax; Sleep Apnea; Tuberculosis Past Medical History Notes: hx pulmonary embolism Cardiovascular Complaints and Symptoms: Negative for: LE edema Medical History: Positive for: Congestive Heart Failure; Deep Vein Thrombosis; Hypertension Negative for: Angina; Arrhythmia; Coronary Artery Disease; Hypotension; Myocardial Infarction; Peripheral Arterial Disease; Peripheral Venous Disease; Phlebitis; Vasculitis Psychiatric Complaints and Symptoms: Negative for: Anxiety; Claustrophobia Endocrine Medical History: Positive for: Type II  Diabetes Negative for: Type I Diabetes Time with diabetes: 5 years Treated with: Oral agents Blood sugar tested every day: Yes Tested : QD Integumentary (Skin) Medical History: Negative for: History of Burn; History of pressure wounds Musculoskeletal JACKSON-Sanjuan, Adonis K. ([id number]) Medical History: Positive for: Osteoarthritis Negative for: Gout; Rheumatoid Arthritis; Osteomyelitis Past Medical History Notes: OP Neurologic Medical History: Positive for: Neuropathy Negative for: Dementia; Quadriplegia; Paraplegia; Seizure Disorder Immunizations Pneumococcal Vaccine: Received Pneumococcal Vaccination: Yes Implantable Devices None Family and Social History Cancer: No; Diabetes: Yes - Siblings; Heart Disease: No; Hereditary Spherocytosis: No; Hypertension: No; Kidney Disease: No; Lung Disease: No; Seizures: No; Stroke: No; Thyroid Problems: No; Tuberculosis: No; Never smoker; Marital Status - Married; Alcohol Use: Never; Drug Use: No History; Caffeine Use: Daily; Financial Concerns: No; Food, Clothing or Shelter Needs: No; Support System Lacking: No; Transportation Concerns: No Physician Affirmation I have reviewed and agree with the above information. Electronic Signature(s) Signed: 03/15/2019 6:36:47 PM By: Worthy Keeler PA-C Signed: 03/16/2019 10:17:05 AM By: Army Melia Entered By: Worthy Keeler on 03/15/2019 18:27:33 JACKSON-Konecny, Sema K. ([id number]) -------------------------------------------------------------------------------- SuperBill Details Patient Name: JACKSON-Paddock, Lissie K. Date of Service: 03/15/2019 Medical Record Number: [id number] Patient Account Number: [id number] Date of Birth/Sex: 12-06-1950 (68 y.o. 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 L02.416 Cutaneous abscess of left lower limb Facility Procedures CPT4 Code: [id number] Description: 99213 - WOUND CARE VISIT-LEV 3 EST PT Modifier: Quantity: 1 Physician Procedures CPT4 Code Description: [id number] 99214 - WC PHYS LEVEL 4 - EST PT ICD-10 Diagnosis Description E11.9 Type 2 diabetes mellitus without complications Y48.250 Non-pressure chronic ulcer of left  calf with fat layer exposed L97.812 Non-pressure chronic ulcer of  other part of right lower leg wi L02.416 Cutaneous abscess of left lower limb Modifier: th fat layer expo Quantity: 1 sed Electronic Signature(s) Signed: 03/15/2019 6:29:37 PM By: Worthy Keeler PA-C Entered By: Worthy Keeler on 03/15/2019 18:29:37

## 2019-03-17 NOTE — Progress Notes (Signed)
MAVIS, GRAVELLE ([id number]) Visit Report for 03/15/2019 Arrival Information Details Patient Name: BELLVILLE, Cianni K. Date of Service: 03/15/2019 9:15 AM Medical Record Number: [id number] Patient Account Number: [id number] Date of Birth/Sex: 26-Jul-1950 (68 y.o. F) Treating RN: Cornell Barman Primary Care Ranae Casebier: Tracie Harrier Other Clinician: Referring Dominiq Fontaine: Tracie Harrier Treating Meryn Sarracino/Extender: Melburn Hake, HOYT Weeks in Treatment: 10 Visit Information History Since Last Visit Added or deleted any medications: No Patient Arrived: Cane Any new allergies or adverse reactions: No Arrival Time: 09:11 Had a fall or experienced change in No Accompanied By: self activities of daily living that may affect Transfer Assistance: None risk of falls: Patient Has Alerts: Yes Signs or symptoms of abuse/neglect since last visito No Patient Alerts: Patient on Blood Thinner Hospitalized since last visit: No Warfarin Implantable device outside of the clinic excluding No DMII cellular tissue based products placed in the center since last visit: Pain Present Now: No Electronic Signature(s) Signed: 03/16/2019 5:23:28 PM By: Gretta Cool, BSN, RN, CWS, Kim RN, BSN Entered By: Gretta Cool, BSN, RN, CWS, Kim on 03/15/2019 09:11:38 [place], [place]. ([id number]) -------------------------------------------------------------------------------- Clinic Level of Care Assessment Details Patient Name: JACKSON-Veloso, Giulia K. Date of Service: 03/15/2019 9:15 AM Medical Record Number: [id number] Patient Account Number: [id number] Date of Birth/Sex: 06-06-1950 (68 y.o. F) Treating RN: Army Melia Primary Care Gardner Servantes: Tracie Harrier Other Clinician: Referring Lorna Strother: Tracie Harrier Treating Normagene Harvie/Extender: Melburn Hake, HOYT Weeks in Treatment: 10 Clinic Level of Care Assessment Items TOOL 4 Quantity Score []  - Use when only an EandM is performed on FOLLOW-UP visit  0 ASSESSMENTS - Nursing Assessment / Reassessment X - Reassessment of Co-morbidities (includes updates in patient status) 1 10 X- 1 5 Reassessment of Adherence to Treatment Plan ASSESSMENTS - Wound and Skin Assessment / Reassessment []  - Simple Wound Assessment / Reassessment - one wound 0 X- 3 5 Complex Wound Assessment / Reassessment - multiple wounds []  - 0 Dermatologic / Skin Assessment (not related to wound area) ASSESSMENTS - Focused Assessment []  - Circumferential Edema Measurements - multi extremities 0 []  - 0 Nutritional Assessment / Counseling / Intervention []  - 0 Lower Extremity Assessment (monofilament, tuning fork, pulses) []  - 0 Peripheral Arterial Disease Assessment (using hand held doppler) ASSESSMENTS - Ostomy and/or Continence Assessment and Care []  - Incontinence Assessment and Management 0 []  - 0 Ostomy Care Assessment and Management (repouching, etc.) PROCESS - Coordination of Care X - Simple Patient / Family Education for ongoing care 1 15 []  - 0 Complex (extensive) Patient / Family Education for ongoing care []  - 0 Staff obtains Programmer, systems, Records, Test Results / Process Orders []  - 0 Staff telephones HHA, Nursing Homes / Clarify orders / etc []  - 0 Routine Transfer to another Facility (non-emergent condition) []  - 0 Routine Hospital Admission (non-emergent condition) []  - 0 New Admissions / Biomedical engineer / Ordering NPWT, Apligraf, etc. []  - 0 Emergency Hospital Admission (emergent condition) X- 1 10 Simple Discharge Coordination JACKSON-Tapscott, Yanin K. ([id number]) []  - 0 Complex (extensive) Discharge Coordination PROCESS - Special Needs []  - Pediatric / Minor Patient Management 0 []  - 0 Isolation Patient Management []  - 0 Hearing / Language / Visual special needs []  - 0 Assessment of Community assistance (transportation, D/C planning, etc.) []  - 0 Additional assistance / Altered mentation []  - 0 Support Surface(s) Assessment  (bed, cushion, seat, etc.) INTERVENTIONS - Wound Cleansing / Measurement []  - Simple Wound Cleansing - one wound 0 X- 3 5 Complex Wound Cleansing - multiple wounds X- 1 5  Wound Imaging (photographs - any number of wounds) []  - 0 Wound Tracing (instead of photographs) []  - 0 Simple Wound Measurement - one wound X- 3 5 Complex Wound Measurement - multiple wounds INTERVENTIONS - Wound Dressings []  - Small Wound Dressing one or multiple wounds 0 X- 1 15 Medium Wound Dressing one or multiple wounds []  - 0 Large Wound Dressing one or multiple wounds []  - 0 Application of Medications - topical []  - 0 Application of Medications - injection INTERVENTIONS - Miscellaneous []  - External ear exam 0 []  - 0 Specimen Collection (cultures, biopsies, blood, body fluids, etc.) []  - 0 Specimen(s) / Culture(s) sent or taken to Lab for analysis []  - 0 Patient Transfer (multiple staff / Civil Service fast streamer / Similar devices) []  - 0 Simple Staple / Suture removal (25 or less) []  - 0 Complex Staple / Suture removal (26 or more) []  - 0 Hypo / Hyperglycemic Management (close monitor of Blood Glucose) []  - 0 Ankle / Brachial Index (ABI) - do not check if billed separately X- 1 5 Vital Signs JACKSON-Gallaga, Addylynn K. ([id number]) Has the patient been seen at the hospital within the last three years: Yes Total Score: 110 Level Of Care: New/Established - Level 3 Electronic Signature(s) Signed: 03/15/2019 1:28:54 PM By: Army Melia Entered By: Army Melia on 03/15/2019 09:28:24 JACKSON-Righetti, Rosena K. ([id number]) -------------------------------------------------------------------------------- Encounter Discharge Information Details Patient Name: JACKSON-Mahurin, Karee K. Date of Service: 03/15/2019 9:15 AM Medical Record Number: [id number] Patient Account Number: [id number] Date of Birth/Sex: 15-Dec-1950 (68 y.o. F) Treating RN: Army Melia Primary Care Calleigh Lafontant: Tracie Harrier Other  Clinician: Referring Maricela Schreur: Tracie Harrier Treating Chong January/Extender: Melburn Hake, HOYT Weeks in Treatment: 10 Encounter Discharge Information Items Discharge Condition: Stable Ambulatory Status: Ambulatory Discharge Destination: Home Transportation: Private Auto Accompanied By: self Schedule Follow-up Appointment: Yes Clinical Summary of Care: Electronic Signature(s) Signed: 03/15/2019 1:28:54 PM By: Army Melia Entered By: Army Melia on 03/15/2019 09:29:24 JACKSON-Labrecque, Karely K. ([id number]) -------------------------------------------------------------------------------- Lower Extremity Assessment Details Patient Name: JACKSON-Dinan, Christiana K. Date of Service: 03/15/2019 9:15 AM Medical Record Number: [id number] Patient Account Number: [id number] Date of Birth/Sex: [birth year] (68 y.o. F) Treating RN: Cornell Barman Primary Care Chinenye Katzenberger: Tracie Harrier Other Clinician: Referring Tyronn Golda: Tracie Harrier Treating Yadiel Aubry/Extender: Melburn Hake, HOYT Weeks in Treatment: 10 Edema Assessment Assessed: [Left: No] [Right: No] Edema: [Left: No] [Right: No] Vascular Assessment Pulses: Dorsalis Pedis Palpable: [Left:Yes] [Right:Yes] Electronic Signature(s) Signed: 03/16/2019 5:23:28 PM By: Gretta Cool, BSN, RN, CWS, Kim RN, BSN Entered By: Gretta Cool, BSN, RN, CWS, Kim on 03/15/2019 09:19:56 JACKSON-Kattner, Shan K. ([id number]) -------------------------------------------------------------------------------- Multi Wound Chart Details Patient Name: JACKSON-Elko, Arcadia K. Date of Service: 03/15/2019 9:15 AM Medical Record Number: [id number] Patient Account Number: [id number] Date of Birth/Sex: 1951-02-25 (68 y.o. F) Treating RN: Army Melia Primary Care Graeme Menees: Tracie Harrier Other Clinician: Referring Myliah Medel: Tracie Harrier Treating Luticia Tadros/Extender: Melburn Hake, HOYT Weeks in Treatment: 10 Vital Signs Height(in): 63 Pulse(bpm): 75 Weight(lbs): 227 Blood  Pressure(mmHg): 122/57 Body Mass Index(BMI): 40 Temperature(F): 97.6 Respiratory Rate 16 (breaths/min): Photos: Wound Location: Left, Anterior Lower Leg Right Lower Leg - Lateral Left Lower Leg - Medial, Posterior Wounding Event: Trauma Gradually Appeared Gradually Appeared Primary Etiology: Diabetic Wound/Ulcer of the Diabetic Wound/Ulcer of the Diabetic Wound/Ulcer of the Lower Extremity Lower Extremity Lower Extremity Secondary Etiology: Trauma, Other N/A N/A Comorbid History: Congestive Heart Failure, Congestive Heart Failure, Congestive Heart Failure, Deep Vein Thrombosis, Deep Vein Thrombosis, Deep Vein Thrombosis, Hypertension, Type II Hypertension, Type II Hypertension, Type II Diabetes, Osteoarthritis, Diabetes, Osteoarthritis, Diabetes,  Osteoarthritis, Neuropathy Neuropathy Neuropathy Date Acquired: 12/21/2018 01/30/2019 02/09/2019 Weeks of Treatment: 10 6 4  Wound Status: Healed - Epithelialized Open Open Clustered Wound: No No Yes Clustered Quantity: N/A N/A 2 Measurements L x W x D 0x0x0 0.2x0.2x0.1 0.7x0.5x0.1 (cm) Area (cm) : 0 0.031 0.275 Volume (cm) : 0 0.003 0.027 % Reduction in Area: 100.00% 84.20% 83.30% % Reduction in Volume: 100.00% 85.00% 83.60% Classification: Grade 1 Grade 1 Grade 1 Exudate Amount: Large None Present Medium Exudate Type: Serosanguineous N/A Serosanguineous Exudate Color: red, brown N/A red, brown Wound Margin: Flat and Intact Distinct, outline attached Distinct, outline attached Granulation Amount: None Present (0%) None Present (0%) Medium (34-66%) Necrotic Amount: Small (1-33%) Large (67-100%) Medium (34-66%) JACKSON-Pellum, Dioselina K. ([id number]) Necrotic Tissue: [place] Exposed Structures: Fascia: No Fascia: No Fat Layer (Subcutaneous Fat Layer (Subcutaneous Fat Layer (Subcutaneous Tissue) Exposed: Yes Tissue) Exposed: No Tissue) Exposed: No Fascia: No Tendon: No Tendon: No Tendon: No Muscle: No Muscle:  No Muscle: No Joint: No Joint: No Joint: No Bone: No Bone: No Bone: No Limited to Skin Breakdown Limited to Skin Breakdown Epithelialization: Large (67-100%) None Small (1-33%) Treatment Notes Electronic Signature(s) Signed: 03/15/2019 1:28:54 PM By: Army Melia Entered By: Army Melia on 03/15/2019 09:24:19 JACKSON-Harston, Galina K. ([id number]) -------------------------------------------------------------------------------- Multi-Disciplinary Care Plan Details Patient Name: JACKSON-Fiveash, Zalika K. Date of Service: 03/15/2019 9:15 AM Medical Record Number: [id number] Patient Account Number: [id number] Date of Birth/Sex: 1951/04/08 (68 y.o. F) Treating RN: Army Melia Primary Care Loreto Loescher: Tracie Harrier Other Clinician: Referring Sybilla Malhotra: Tracie Harrier Treating Lorielle Boehning/Extender: Melburn Hake, HOYT Weeks in Treatment: 10 Active Inactive Abuse / Safety / Falls / Self Care Management Nursing Diagnoses: History of Falls Goals: Patient will not experience any injury related to falls Date Initiated: 01/03/2019 Target Resolution Date: 01/17/2019 Goal Status: Active Interventions: Assess fall risk on admission and as needed Notes: Medication Nursing Diagnoses: Knowledge deficit related to medication safety: actual or potential Goals: Patient/caregiver will demonstrate understanding of all current medications Date Initiated: 01/03/2019 Target Resolution Date: 01/03/2019 Goal Status: Active Interventions: Patient/Caregiver given reconciled medication list upon admission, changes in medications and discharge from the [facility] Notes: Necrotic Tissue Nursing Diagnoses: Impaired tissue integrity related to necrotic/devitalized tissue Goals: Necrotic/devitalized tissue will be minimized in the wound bed Date Initiated: 01/03/2019 Target Resolution Date: 01/10/2019 Goal Status: Active Interventions: JACKSON-Tumminello, Ellora K. ([id number]) Assess patient pain level  pre-, during and post procedure and prior to discharge Treatment Activities: Apply topical anesthetic as ordered : 01/03/2019 Excisional debridement : 01/03/2019 Notes: Wound/Skin Impairment Nursing Diagnoses: Impaired tissue integrity Goals: Ulcer/skin breakdown will have a volume reduction of 30% by week 4 Date Initiated: 01/03/2019 Target Resolution Date: 02/03/2019 Goal Status: Active Interventions: Assess patient/caregiver ability to obtain necessary supplies Assess ulceration(s) every visit Treatment Activities: Skin care regimen initiated : 01/03/2019 Topical wound management initiated : 01/03/2019 Notes: Electronic Signature(s) Signed: 03/15/2019 1:28:54 PM By: Army Melia Entered By: Army Melia on 03/15/2019 09:24:09 JACKSON-Kusek, Cora K. ([id number]) -------------------------------------------------------------------------------- Pain Assessment Details Patient Name: JACKSON-Danielsen, Mckenley K. Date of Service: 03/15/2019 9:15 AM Medical Record Number: [id number] Patient Account Number: [id number] Date of Birth/Sex: 31-Aug-1950 (68 y.o. F) Treating RN: Cornell Barman Primary Care Sargent Mankey: Tracie Harrier Other Clinician: Referring Aleynah Rocchio: Tracie Harrier Treating Emmah Bratcher/Extender: Melburn Hake, HOYT Weeks in Treatment: 10 Active Problems Location of Pain Severity and Description of Pain Patient Has Paino No Site Locations Pain Management and Medication Current Pain Management: Goals for Pain Management Patient denies pain at this time. Electronic Signature(s) Signed:  03/16/2019 5:23:28 PM By: Gretta Cool, BSN, RN, CWS, Kim RN, BSN Entered By: Gretta Cool, BSN, RN, CWS, Kim on 03/15/2019 09:11:52 [place], Acquanetta Belling ([id number]) -------------------------------------------------------------------------------- Patient/Caregiver Education Details Patient Name: JACKSON-Glascoe, Felicidad K. Date of Service: 03/15/2019 9:15 AM Medical Record Number: [id number] Patient Account  Number: [id number] Date of Birth/Gender: 05/22/1951 (68 y.o. F) Treating RN: Army Melia Primary Care Physician: Tracie Harrier Other Clinician: Referring Physician: Tracie Harrier Treating Physician/Extender: Sharalyn Ink in Treatment: 10 Education Assessment Education Provided To: Patient Education Topics Provided Wound/Skin Impairment: Handouts: Caring for Your Ulcer Methods: Demonstration, Explain/Verbal Responses: State content correctly Electronic Signature(s) Signed: 03/15/2019 1:28:54 PM By: Army Melia Entered By: Army Melia on 03/15/2019 09:28:36 [place], Danamarie K. ([id number]) -------------------------------------------------------------------------------- Wound Assessment Details Patient Name: JACKSON-Peschke, Josslin K. Date of Service: 03/15/2019 9:15 AM Medical Record Number: [id number] Patient Account Number: [id number] Date of Birth/Sex: 1950/12/23 (68 y.o. F) Treating RN: Army Melia Primary Care Charliene Inoue: Tracie Harrier Other Clinician: Referring Seba Madole: Tracie Harrier Treating Gavin Telford/Extender: STONE III, HOYT Weeks in Treatment: 10 Wound Status Wound Number: 1 Primary Diabetic Wound/Ulcer of the Lower Extremity Etiology: Wound Location: Left, Anterior Lower Leg Secondary Trauma, Other Wounding Event: Trauma Etiology: Date Acquired: 12/21/2018 Wound Healed - Epithelialized Weeks Of Treatment: 10 Status: Clustered Wound: No Comorbid Congestive Heart Failure, Deep Vein History: Thrombosis, Hypertension, Type II Diabetes, Osteoarthritis, Neuropathy Photos Wound Measurements Length: (cm) 0 % Reductio Width: (cm) 0 % Reductio Depth: (cm) 0 Epithelial Area: (cm) 0 Tunneling Volume: (cm) 0 Undermini n in Area: 100% n in Volume: 100% ization: Large (67-100%) : No ng: No Wound Description Classification: Grade 1 Foul Odor Wound Margin: Flat and Intact Slough/Fib Exudate Amount: Large Exudate Type:  Serosanguineous Exudate Color: red, brown After Cleansing: No rino Yes Wound Bed Granulation Amount: None Present (0%) Exposed Structure Necrotic Amount: Small (1-33%) Fascia Exposed: No Necrotic Quality: Eschar Fat Layer (Subcutaneous Tissue) Exposed: No Tendon Exposed: No Muscle Exposed: No Joint Exposed: No Bone Exposed: No Limited to Skin Breakdown JACKSON-Roskos, Christalyn K. ([id number]) Electronic Signature(s) Signed: 03/15/2019 1:28:54 PM By: Army Melia Entered By: Army Melia on 03/15/2019 09:24:01 JACKSON-Westerfeld, Abbygail K. ([id number]) -------------------------------------------------------------------------------- Wound Assessment Details Patient Name: JACKSON-Rosas, Lacheryl K. Date of Service: 03/15/2019 9:15 AM Medical Record Number: [id number] Patient Account Number: [id number] Date of Birth/Sex: [birth year] (68 y.o. F) Treating RN: Army Melia Primary Care Brenten Janney: Tracie Harrier Other Clinician: Referring Kamee Bobst: Tracie Harrier Treating Matricia Begnaud/Extender: STONE III, HOYT Weeks in Treatment: 10 Wound Status Wound Number: 2 Primary Diabetic Wound/Ulcer of the Lower Extremity Etiology: Wound Location: Right, Lateral Lower Leg Wound Healed - Epithelialized Wounding Event: Gradually Appeared Status: Date Acquired: 01/30/2019 Comorbid Congestive Heart Failure, Deep Vein Weeks Of Treatment: 6 History: Thrombosis, Hypertension, Type II Diabetes, Clustered Wound: No Osteoarthritis, Neuropathy Photos Wound Measurements Length: (cm) 0 % Reduc Width: (cm) 0 % Reduc Depth: (cm) 0 Epithel Area: (cm) 0 Tunnel Volume: (cm) 0 Underm tion in Area: 100% tion in Volume: 100% ialization: None ing: No ining: No Wound Description Classification: Grade 1 Foul Od Wound Margin: Distinct, outline attached Slough/ Exudate Amount: None Present or After Cleansing: No Fibrino No Wound Bed Granulation Amount: None Present (0%) Exposed Structure Necrotic Amount:  Large (67-100%) Fascia Exposed: No Necrotic Quality: Eschar Fat Layer (Subcutaneous Tissue) Exposed: No Tendon Exposed: No Muscle Exposed: No Joint Exposed: No Bone Exposed: No Limited to Skin Breakdown Electronic Signature(s) Signed: 03/15/2019 1:28:54 PM By: Corky Sox, Kylar K. ([id number]) Entered By: Army Melia on 03/15/2019 09:24:54 JACKSON-Poblano, Teddi K. ([id number]) --------------------------------------------------------------------------------  Wound Assessment Details Patient Name: JACKSON-Sepulveda, Brittani K. Date of Service: 03/15/2019 9:15 AM Medical Record Number: [id number] Patient Account Number: [id number] Date of Birth/Sex: 05-22-1951 (68 y.o. F) Treating RN: Cornell Barman Primary Care Vernestine Brodhead: Tracie Harrier Other Clinician: Referring Aydrien Froman: Tracie Harrier Treating Xitlalli Newhard/Extender: Melburn Hake, HOYT Weeks in Treatment: 10 Wound Status Wound Number: 3 Primary Diabetic Wound/Ulcer of the Lower Extremity Etiology: Wound Location: Left Lower Leg - Medial, Posterior Wound Open Wounding Event: Gradually Appeared Status: Date Acquired: 02/09/2019 Comorbid Congestive Heart Failure, Deep Vein Weeks Of Treatment: 4 History: Thrombosis, Hypertension, Type II Diabetes, Clustered Wound: Yes Osteoarthritis, Neuropathy Photos Wound Measurements Length: (cm) 0.7 % Reduc Width: (cm) 0.5 % Reduc Depth: (cm) 0.1 Epithel Clustered Quantity: 2 Tunneli Area: (cm) 0.275 Underm Volume: (cm) 0.027 tion in Area: 83.3% tion in Volume: 83.6% ialization: Small (1-33%) ng: No ining: No Wound Description Classification: Grade 1 Wound Margin: Distinct, outline attached Exudate Amount: Medium Exudate Type: Serosanguineous Exudate Color: red, brown Foul Odor After Cleansing: No Slough/Fibrino Yes Wound Bed Granulation Amount: Medium (34-66%) Exposed Structure Granulation Quality: Hyper-granulation Fascia Exposed: No Necrotic Amount: Medium  (34-66%) Fat Layer (Subcutaneous Tissue) Exposed: Yes Necrotic Quality: Adherent Slough Tendon Exposed: No Muscle Exposed: No Joint Exposed: No Bone Exposed: No JACKSON-Abdallah, Evamarie K. ([id number]) Treatment Notes Wound #3 (Left, Medial, Posterior Lower Leg) Notes prisma, gauze, paper tape to left leg Electronic Signature(s) Signed: 03/16/2019 5:23:28 PM By: Gretta Cool, BSN, RN, CWS, Kim RN, BSN Entered By: Gretta Cool, BSN, RN, CWS, Kim on 03/15/2019 09:18:42 JACKSON-Shanahan, Ravynn K. ([id number]) -------------------------------------------------------------------------------- [place] Details Patient Name: JACKSON-Supple, Denine K. Date of Service: 03/15/2019 9:15 AM Medical Record Number: [id number] Patient Account Number: [id number] Date of Birth/Sex: 01-18-1951 (68 y.o. F) Treating RN: Cornell Barman Primary Care Altamese Deguire: Tracie Harrier Other Clinician: Referring Cathy Ropp: Tracie Harrier Treating Duard Spiewak/Extender: Melburn Hake, HOYT Weeks in Treatment: 10 Vital Signs Time Taken: 09:11 Temperature (F): 97.6 Height (in): 63 Pulse (bpm): 75 Weight (lbs): 227 Respiratory Rate (breaths/min): 16 Body Mass Index (BMI): 40.2 Blood Pressure (mmHg): 122/57 Reference Range: 80 - 120 mg / dl Electronic Signature(s) Signed: 03/16/2019 5:23:28 PM By: Gretta Cool, BSN, RN, CWS, Kim RN, BSN Entered By: Gretta Cool, BSN, RN, CWS, Kim on 03/15/2019 09:13:14

## 2019-03-20 DIAGNOSIS — Z7901 Long term (current) use of anticoagulants: Secondary | ICD-10-CM | POA: Diagnosis not present

## 2019-03-28 ENCOUNTER — Telehealth: Payer: Self-pay | Admitting: Pain Medicine

## 2019-03-28 NOTE — Telephone Encounter (Signed)
Tried to call/ had to lvmail

## 2019-03-28 NOTE — Telephone Encounter (Signed)
Patient wants to know if she can have another procedure. Last one wast 03-08-19 she has follow up appt on 04-02-19

## 2019-03-28 NOTE — Telephone Encounter (Signed)
She called back and said her last procedure didn't help much, just a little. She is  having pain in low back, both buttocks and both hips. Pain shoots down her leg on the right side.

## 2019-03-28 NOTE — Telephone Encounter (Signed)
Attempted to call patient to ask questions about her prior procedure and to discuss the pain she is having  So that we may be able to discuss with Dr Dossie Arbour and get the appropriate procedure.  Left message for her to call us back to discuss.

## 2019-03-29 ENCOUNTER — Other Ambulatory Visit: Payer: Self-pay

## 2019-03-29 ENCOUNTER — Encounter: Payer: Self-pay | Admitting: Pain Medicine

## 2019-03-29 ENCOUNTER — Encounter: Payer: PPO | Attending: Physician Assistant | Admitting: Physician Assistant

## 2019-03-29 DIAGNOSIS — L97812 Non-pressure chronic ulcer of other part of right lower leg with fat layer exposed: Secondary | ICD-10-CM | POA: Insufficient documentation

## 2019-03-29 DIAGNOSIS — E1140 Type 2 diabetes mellitus with diabetic neuropathy, unspecified: Secondary | ICD-10-CM | POA: Insufficient documentation

## 2019-03-29 DIAGNOSIS — R600 Localized edema: Secondary | ICD-10-CM | POA: Diagnosis not present

## 2019-03-29 DIAGNOSIS — M1990 Unspecified osteoarthritis, unspecified site: Secondary | ICD-10-CM | POA: Diagnosis not present

## 2019-03-29 DIAGNOSIS — Z86711 Personal history of pulmonary embolism: Secondary | ICD-10-CM | POA: Diagnosis not present

## 2019-03-29 DIAGNOSIS — Z86718 Personal history of other venous thrombosis and embolism: Secondary | ICD-10-CM | POA: Diagnosis not present

## 2019-03-29 DIAGNOSIS — Z7901 Long term (current) use of anticoagulants: Secondary | ICD-10-CM | POA: Diagnosis not present

## 2019-03-29 DIAGNOSIS — L97222 Non-pressure chronic ulcer of left calf with fat layer exposed: Secondary | ICD-10-CM | POA: Diagnosis not present

## 2019-03-29 DIAGNOSIS — L02416 Cutaneous abscess of left lower limb: Secondary | ICD-10-CM | POA: Diagnosis not present

## 2019-03-29 DIAGNOSIS — I110 Hypertensive heart disease with heart failure: Secondary | ICD-10-CM | POA: Diagnosis not present

## 2019-03-29 DIAGNOSIS — L97229 Non-pressure chronic ulcer of left calf with unspecified severity: Secondary | ICD-10-CM | POA: Diagnosis not present

## 2019-03-29 DIAGNOSIS — Z85038 Personal history of other malignant neoplasm of large intestine: Secondary | ICD-10-CM | POA: Diagnosis not present

## 2019-03-29 DIAGNOSIS — I509 Heart failure, unspecified: Secondary | ICD-10-CM | POA: Diagnosis not present

## 2019-03-29 DIAGNOSIS — Z8614 Personal history of Methicillin resistant Staphylococcus aureus infection: Secondary | ICD-10-CM | POA: Diagnosis not present

## 2019-03-29 NOTE — Progress Notes (Signed)
Pain relief after procedure (treated area only): (Questions asked to patient) 1. About 15 minutes after the procedure, while the area was still numb from the local anesthetics, were you having any pain in the area? First 1 hour: 100 % better. Initial 4-6 hours: 100 % better. 2. How many days was the area numb? Any benefit longer than 6 hours: 100 % better. 3. How much better is your pain now, when compared to before the procedure? Current benefit: 0 reports pain was better for the next 7 -8 days  % better. 4. Can you move better now? Improvement in ROM (Range of Motion): No. 5. Can you do more now? Improvement in function: No. 4. Did you have any problems with the procedure? Side-effects/Complications: No.

## 2019-03-29 NOTE — Progress Notes (Addendum)
ALLANI, REBER ([id number]) Visit Report for 03/29/2019 Arrival Information Details Patient Name: NIGH, Azyriah K. Date of Service: 03/29/2019 9:00 AM Medical Record Number: [id number] Patient Account Number: [id number] Date of Birth/Sex: [birth year] (68 y.o. F) Treating RN: Harold Barban Primary Care Keilani Terrance: Tracie Harrier Other Clinician: Referring Tiberius Loftus: Tracie Harrier Treating Sparrow Siracusa/Extender: Melburn Hake, HOYT Weeks in Treatment: 12 Visit Information History Since Last Visit Added or deleted any medications: No Patient Arrived: Cane Any new allergies or adverse reactions: No Arrival Time: 09:02 Had a fall or experienced change in No Accompanied By: self activities of daily living that may affect Transfer Assistance: None risk of falls: Patient Identification Verified: Yes Signs or symptoms of abuse/neglect since last visito No Secondary Verification Process Yes Hospitalized since last visit: No Completed: Has Dressing in Place as Prescribed: Yes Patient Has Alerts: Yes Pain Present Now: No Patient Alerts: Patient on Blood Thinner Warfarin DMII Electronic Signature(s) Signed: 03/29/2019 4:15:06 PM By: Harold Barban Entered By: Harold Barban on 03/29/2019 09:02:42 [place], Tauna K. ([id number]) -------------------------------------------------------------------------------- Clinic Level of Care Assessment Details Patient Name: JACKSON-Poznanski, Bristyn K. Date of Service: 03/29/2019 9:00 AM Medical Record Number: [id number] Patient Account Number: [id number] Date of Birth/Sex: [birth year] (68 y.o. F) Treating RN: Army Melia Primary Care Dannon Nguyenthi: Tracie Harrier Other Clinician: Referring Leslye Puccini: Tracie Harrier Treating Nalaysia Manganiello/Extender: Melburn Hake, HOYT Weeks in Treatment: 12 Clinic Level of Care Assessment Items TOOL 4 Quantity Score []  - Use when only an EandM is performed on FOLLOW-UP visit 0 ASSESSMENTS - Nursing  Assessment / Reassessment X - Reassessment of Co-morbidities (includes updates in patient status) 1 10 X- 1 5 Reassessment of Adherence to Treatment Plan ASSESSMENTS - Wound and Skin Assessment / Reassessment X - Simple Wound Assessment / Reassessment - one wound 1 5 []  - 0 Complex Wound Assessment / Reassessment - multiple wounds []  - 0 Dermatologic / Skin Assessment (not related to wound area) ASSESSMENTS - Focused Assessment X - Circumferential Edema Measurements - multi extremities 1 5 []  - 0 Nutritional Assessment / Counseling / Intervention X- 1 5 Lower Extremity Assessment (monofilament, tuning fork, pulses) []  - 0 Peripheral Arterial Disease Assessment (using hand held doppler) ASSESSMENTS - Ostomy and/or Continence Assessment and Care []  - Incontinence Assessment and Management 0 []  - 0 Ostomy Care Assessment and Management (repouching, etc.) PROCESS - Coordination of Care X - Simple Patient / Family Education for ongoing care 1 15 []  - 0 Complex (extensive) Patient / Family Education for ongoing care X- 1 10 Staff obtains Programmer, systems, Records, Test Results / Process Orders []  - 0 Staff telephones HHA, Nursing Homes / Clarify orders / etc []  - 0 Routine Transfer to another Facility (non-emergent condition) []  - 0 Routine Hospital Admission (non-emergent condition) []  - 0 New Admissions / Biomedical engineer / Ordering NPWT, Apligraf, etc. []  - 0 Emergency Hospital Admission (emergent condition) X- 1 10 Simple Discharge Coordination JACKSON-Alvarenga, Marthella K. ([id number]) []  - 0 Complex (extensive) Discharge Coordination PROCESS - Special Needs []  - Pediatric / Minor Patient Management 0 []  - 0 Isolation Patient Management []  - 0 Hearing / Language / Visual special needs []  - 0 Assessment of Community assistance (transportation, D/C planning, etc.) []  - 0 Additional assistance / Altered mentation []  - 0 Support Surface(s) Assessment (bed, cushion, seat,  etc.) INTERVENTIONS - Wound Cleansing / Measurement X - Simple Wound Cleansing - one wound 1 5 []  - 0 Complex Wound Cleansing - multiple wounds X- 1 5 Wound Imaging (photographs - any number of wounds) []  -  0 Wound Tracing (instead of photographs) []  - 0 Simple Wound Measurement - one wound []  - 0 Complex Wound Measurement - multiple wounds INTERVENTIONS - Wound Dressings []  - Small Wound Dressing one or multiple wounds 0 []  - 0 Medium Wound Dressing one or multiple wounds []  - 0 Large Wound Dressing one or multiple wounds []  - 0 Application of Medications - topical []  - 0 Application of Medications - injection INTERVENTIONS - Miscellaneous []  - External ear exam 0 []  - 0 Specimen Collection (cultures, biopsies, blood, body fluids, etc.) []  - 0 Specimen(s) / Culture(s) sent or taken to Lab for analysis []  - 0 Patient Transfer (multiple staff / Civil Service fast streamer / Similar devices) []  - 0 Simple Staple / Suture removal (25 or less) []  - 0 Complex Staple / Suture removal (26 or more) []  - 0 Hypo / Hyperglycemic Management (close monitor of Blood Glucose) []  - 0 Ankle / Brachial Index (ABI) - do not check if billed separately X- 1 5 Vital Signs JACKSON-Munguia, Ryan K. ([id number]) Has the patient been seen at the hospital within the last three years: Yes Total Score: 80 Level Of Care: New/Established - Level 3 Electronic Signature(s) Signed: 03/29/2019 10:12:12 AM By: Army Melia Entered By: Army Melia on 03/29/2019 09:32:04 JACKSON-Scorsone, Kuuipo K. ([id number]) -------------------------------------------------------------------------------- Encounter Discharge Information Details Patient Name: JACKSON-Kahler, Tifani K. Date of Service: 03/29/2019 9:00 AM Medical Record Number: [id number] Patient Account Number: [id number] Date of Birth/Sex: [birth year] (68 y.o. F) Treating RN: Army Melia Primary Care Jori Thrall: Tracie Harrier Other Clinician: Referring Makell Drohan:  Tracie Harrier Treating Kenon Delashmit/Extender: Melburn Hake, HOYT Weeks in Treatment: 12 Encounter Discharge Information Items Discharge Condition: Stable Ambulatory Status: Ambulatory Discharge Destination: Home Transportation: Private Auto Accompanied By: self Schedule Follow-up Appointment: Yes Clinical Summary of Care: Electronic Signature(s) Signed: 03/29/2019 10:12:12 AM By: Army Melia Entered By: Army Melia on 03/29/2019 09:37:15 JACKSON-Speedy, Brynnly K. ([id number]) -------------------------------------------------------------------------------- Lower Extremity Assessment Details Patient Name: JACKSON-Bistline, Clytee K. Date of Service: 03/29/2019 9:00 AM Medical Record Number: [id number] Patient Account Number: [id number] Date of Birth/Sex: [birth year] (68 y.o. F) Treating RN: Harold Barban Primary Care Ameisha Mcclellan: Tracie Harrier Other Clinician: Referring Brittanyann Wittner: Tracie Harrier Treating Yarrow Linhart/Extender: Melburn Hake, HOYT Weeks in Treatment: 12 Vascular Assessment Pulses: Dorsalis Pedis Palpable: [Left:Yes] Posterior Tibial Doppler Audible: [Left:Yes] Electronic Signature(s) Signed: 03/29/2019 4:15:06 PM By: Harold Barban Entered By: Harold Barban on 03/29/2019 09:09:44 JACKSON-Bonanno, Fate K. ([id number]) -------------------------------------------------------------------------------- Multi Wound Chart Details Patient Name: JACKSON-Stevick, Salaya K. Date of Service: 03/29/2019 9:00 AM Medical Record Number: [id number] Patient Account Number: [id number] Date of Birth/Sex: February 05, 1951 (68 y.o. F) Treating RN: Army Melia Primary Care Kenyatte Chatmon: Tracie Harrier Other Clinician: Referring Tashan Kreitzer: Tracie Harrier Treating Livana Yerian/Extender: Melburn Hake, HOYT Weeks in Treatment: 12 Vital Signs Height(in): 63 Pulse(bpm): 73 Weight(lbs): 227 Blood Pressure(mmHg): 154/71 Body Mass Index(BMI): 40 Temperature(F): 98.1 Respiratory  Rate 18 (breaths/min): Photos: [N/A:N/A] Wound Location: Left Lower Leg - Medial, N/A N/A Posterior Wounding Event: Gradually Appeared N/A N/A Primary Etiology: Diabetic Wound/Ulcer of the N/A N/A Lower Extremity Comorbid History: Congestive Heart Failure, N/A N/A Deep Vein Thrombosis, Hypertension, Type II Diabetes, Osteoarthritis, Neuropathy Date Acquired: 02/09/2019 N/A N/A Weeks of Treatment: 6 N/A N/A Wound Status: Open N/A N/A Clustered Wound: Yes N/A N/A Clustered Quantity: 2 N/A N/A Measurements L x W x D 0x0x0 N/A N/A (cm) Area (cm) : 0 N/A N/A Volume (cm) : 0 N/A N/A % Reduction in Area: 100.00% N/A N/A % Reduction in Volume: 100.00% N/A N/A Classification: Grade 1 N/A N/A  Exudate Amount: None Present N/A N/A Wound Margin: Distinct, outline attached N/A N/A Granulation Amount: Large (67-100%) N/A N/A Granulation Quality: Red, Pink N/A N/A Necrotic Amount: None Present (0%) N/A N/A Exposed Structures: Fascia: No N/A N/A Fat Layer (Subcutaneous JACKSON-Athanas, Tyrese K. ([id number]) Tissue) Exposed: No Tendon: No Muscle: No Joint: No Bone: No Epithelialization: Small (1-33%) N/A N/A Treatment Notes Electronic Signature(s) Signed: 03/29/2019 10:12:12 AM By: Army Melia Entered By: Army Melia on 03/29/2019 09:29:53 JACKSON-Reale, Azucena K. ([id number]) -------------------------------------------------------------------------------- [place] Details Patient Name: JACKSON-Posas, Kynzee K. Date of Service: 03/29/2019 9:00 AM Medical Record Number: [id number] Patient Account Number: [id number] Date of Birth/Sex: 1951/04/02 (68 y.o. F) Treating RN: Army Melia Primary Care Jacklyne Baik: Tracie Harrier Other Clinician: Referring Mahesh Sizemore: Tracie Harrier Treating Ladeidra Borys/Extender: Melburn Hake, HOYT Weeks in Treatment: 12 Active Inactive Electronic Signature(s) Signed: 03/29/2019 10:12:12 AM By: Army Melia Entered By: Army Melia on  03/29/2019 09:29:44 JACKSON-Krizek, Maryum K. ([id number]) -------------------------------------------------------------------------------- Pain Assessment Details Patient Name: JACKSON-Haymond, Keirstyn K. Date of Service: 03/29/2019 9:00 AM Medical Record Number: [id number] Patient Account Number: [id number] Date of Birth/Sex: 1951-04-16 (68 y.o. F) Treating RN: Harold Barban Primary Care Yanil Dawe: Tracie Harrier Other Clinician: Referring Gen Clagg: Tracie Harrier Treating Makari Portman/Extender: Melburn Hake, HOYT Weeks in Treatment: 12 Active Problems Location of Pain Severity and Description of Pain Patient Has Paino No Site Locations Pain Management and Medication Current Pain Management: Electronic Signature(s) Signed: 03/29/2019 4:15:06 PM By: Harold Barban Entered By: Harold Barban on 03/29/2019 09:02:48 JACKSON-Bowker, Becka K. ([id number]) -------------------------------------------------------------------------------- Patient/Caregiver Education Details Patient Name: JACKSON-Delosreyes, Alexandr K. Date of Service: 03/29/2019 9:00 AM Medical Record Number: [id number] Patient Account Number: [id number] Date of Birth/Gender: 03-25-1951 (68 y.o. F) Treating RN: Army Melia Primary Care Physician: Tracie Harrier Other Clinician: Referring Physician: Tracie Harrier Treating Physician/Extender: Sharalyn Ink in Treatment: 12 Education Assessment Education Provided To: Patient Education Topics Provided Wound/Skin Impairment: Handouts: Caring for Your Ulcer Methods: Demonstration, Explain/Verbal Responses: State content correctly Electronic Signature(s) Signed: 03/29/2019 10:12:12 AM By: Army Melia Entered By: Army Melia on 03/29/2019 09:32:20 JACKSON-Chastain, Lavaeh K. ([id number]) -------------------------------------------------------------------------------- Wound Assessment Details Patient Name: JACKSON-Radliff, Keelan K. Date of Service: 03/29/2019 9:00  AM Medical Record Number: [id number] Patient Account Number: [id number] Date of Birth/Sex: Nov 02, 1950 (68 y.o. F) Treating RN: Harold Barban Primary Care Tor Tsuda: Tracie Harrier Other Clinician: Referring Siyon Linck: Tracie Harrier Treating Joslynn Jamroz/Extender: STONE III, HOYT Weeks in Treatment: 12 Wound Status Wound Number: 3 Primary Diabetic Wound/Ulcer of the Lower Extremity Etiology: Wound Location: Left Lower Leg - Medial, Posterior Wound Open Wounding Event: Gradually Appeared Status: Date Acquired: 02/09/2019 Comorbid Congestive Heart Failure, Deep Vein Weeks Of Treatment: 6 History: Thrombosis, Hypertension, Type II Diabetes, Clustered Wound: Yes Osteoarthritis, Neuropathy Photos Wound Measurements Length: (cm) Width: (cm) Depth: (cm) Clustered Quantity: Area: (cm) Volume: (cm) 0 % Reduction in Area: 100% 0 % Reduction in Volume: 100% 0 Epithelialization: Small (1-33%) 2 Tunneling: No 0 Undermining: No 0 Wound Description Classification: Grade 1 Wound Margin: Distinct, outline attached Exudate Amount: None Present Foul Odor After Cleansing: No Slough/Fibrino Yes Wound Bed Granulation Amount: Large (67-100%) Exposed Structure Granulation Quality: Red, Pink Fascia Exposed: No Necrotic Amount: None Present (0%) Fat Layer (Subcutaneous Tissue) Exposed: No Tendon Exposed: No Muscle Exposed: No Joint Exposed: No Bone Exposed: No Electronic Signature(s) Signed: 03/29/2019 4:15:06 PM By: Olin Pia, Dakia K. ([id number]) Entered By: Harold Barban on 03/29/2019 09:09:27 McNab, Isatu K. ([id number]) -------------------------------------------------------------------------------- Vitals Details Patient Name: JACKSON-Fader, Cj K. Date of Service: 03/29/2019 9:00 AM Medical Record Number:  [id number] Patient Account Number: [id number] Date of Birth/Sex: 13-Dec-1950 (68 y.o. F) Treating RN: Harold Barban Primary Care  Katrese Shell: Tracie Harrier Other Clinician: Referring Clorissa Gruenberg: Tracie Harrier Treating Mykalah Saari/Extender: Melburn Hake, HOYT Weeks in Treatment: 12 Vital Signs Time Taken: 09:02 Temperature (F): 98.1 Height (in): 63 Pulse (bpm): 73 Weight (lbs): 227 Respiratory Rate (breaths/min): 18 Body Mass Index (BMI): 40.2 Blood Pressure (mmHg): 154/71 Reference Range: 80 - 120 mg / dl Electronic Signature(s) Signed: 03/29/2019 4:15:06 PM By: Harold Barban Entered By: Harold Barban on 03/29/2019 09:05:15

## 2019-03-29 NOTE — Progress Notes (Addendum)
[name], [name] ([id number]) Visit Report for 03/29/2019 Chief Complaint Document Details Patient Name: Kara [name], [name] Date of Service: 03/29/2019 9:00 AM Medical Record Number: [id number] Patient Account Number: [id number] Date of Birth/Sex: 04-22-1951 (68 y.o. F) Treating RN: Army Melia Primary Care Provider: Tracie Harrier Other Clinician: Referring Provider: Tracie Harrier Treating Provider/Extender: Melburn Hake, Blair Mesina Weeks in Treatment: 12 Information Obtained from: Patient Chief Complaint Left Leg ulcer x 2 weeks Electronic Signature(s) Signed: 03/29/2019 8:58:42 AM By: Worthy Keeler PA-C Entered By: Worthy Keeler on 03/29/2019 08:58:42 Kara [name], [name] ([id number]) -------------------------------------------------------------------------------- HPI Details Patient Name: [name], [name] Date of Service: 03/29/2019 9:00 AM Medical Record Number: [id number] Patient Account Number: [id number] Date of Birth/Sex: November 11, 1950 (68 y.o. F) Treating RN: Army Melia Primary Care Provider: Tracie Harrier Other Clinician: Referring Provider: Tracie Harrier Treating Provider/Extender: Melburn Hake, Jaizon Deroos Weeks in Treatment: 12 History of Present Illness HPI Description: Pleasant 68 year old Caucasian female who fell down a few steps in her home 2 weeks ago and developed bruises on both legs on the shins, on the left shin area she did have an open area that scabbed, patient did ED on 8/1 at which point she had dressing applied to the left shin wound, she had a right shin hematoma that was lanced with oozing of blood after. Since that time patient has noticed that the left calf leg wound developed a black covering on top and had started to hurt, she has been dressing this daily with an antibiotic ointment and gauze wrap. 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 She had a traumatic wound on the left lower extremity. We have been using silver alginate because of drainage. She has been in compression 8/26; this was initially a traumatic wound on the left lower extremity. It has some depth superiorly appears to have come in this week. We have been using silver alginate because of drainage 01/24/19 once again the wrap seems to have come down and she took it off and put Vaseline on the wound on Sunday. She did not call the clinic. About 75% of this is fully granulated 25% is not and that is superiorly. We have been using silver alginate but all changed to silver collagen today. 02/01/19 on evaluation today patient appears to be doing okay in regard to her left anterior lower extremity ulcer. She also has a right lower leg ulcer which really has not been monitored as much at this point due to the fact that it appeared to be doing quite well according to what that [facility] felt. Nonetheless there appears to be some fluid that is leaking from the right side as well upon evaluation today. I think this may need to be addressed at this point unfortunately. I don't think it's just gonna heal on its own at least not very effectively. No fevers, chills, nausea, or vomiting noted at this time. 02/09/2019 today patient appears to be doing  a little better in regard to her left lower extremity. On the right lower extremity she actually does have some necrotic tissue noted at this point. Both areas do seem to be draining quite a  bit at this time. There is no sign of active infection at this point. 02/15/2019 on evaluation today patient unfortunately is having some issues today with a abscess on the medial aspect of her left lower extremity which is new since last week. She states that this actually occurred on Friday she had a blister initially that she took her wrap off and tried to pop. Subsequently since that time it is gotten worse become more painful and appears to be much more infected. There does not appear to be any signs of active systemic infection which is good news. No fever chills noted. She actually called into the office on Tuesday at which point I sent in a prescription for Keflex until she came in today for evaluation. Apparently she called yesterday as well asking what she can do about the pain and was told to take Tylenol as well. Currently she states that this unfortunately is exquisitely tender even to light touch. Unfortunately I do believe this is a much deeper abscess that is even apparent she has a lot of necrotic/hyper granular tissue noted on the surface of the wound. 02/23/2019 upon evaluation today patient actually appears to be doing slightly better compared to last evaluation. I did review her wound culture today which showed that she was positive for MRSA. This is sensitive to clindamycin and tetracycline orally only. Fortunately there is no signs of active infection at this time systemically which is good news. No fevers, chills, nausea, vomiting, or diarrhea. 03/01/2019 on evaluation today patient appears to be doing well with regard to her lower extremity wounds. Her infection seems to be under much better control than previously noted which is excellent news. With that being said I still feel like that [place]. ([id number]) though she is doing a lot better she still has a little better time to go as far as getting these wounds to completely close. Fortunately there is no  signs of active infection at this time. No fever chills noted. 03/08/2019 on evaluation today patient appears to be doing excellent with regard to her wounds. All are measuring significantly smaller which is great news and I do believe she is doing quite well. Fortunately there is no evidence of active infection at this time. No fevers, chills, nausea, vomiting, or diarrhea. 03/15/2019 on evaluation today patient actually appears to be doing very well with regard to her wounds. She has been tolerating the dressing changes without complication. Fortunately her wounds are showing signs of healing in fact to the areas have completely healed the other is doing significantly better. Overall I am extremely pleased with the progress she is made. 03/29/2019 on evaluation today patient actually appears to be doing excellent at this time. She has been tolerating the dressing changes without complication. Fortunately there is no evidence of active infection at this point. She does have some swelling regard to her lower extremities but again I think this could be managed by compression stockings she does actually have some at home I told her to use those. Electronic Signature(s) Signed: 03/29/2019 9:32:26 AM By: Worthy Keeler PA-C Previous Signature: 03/29/2019 9:32:09 AM Version By: Worthy Keeler PA-C Entered By: Worthy Keeler on 03/29/2019 09:32:26 Kara [name], [name] ([id number]) -------------------------------------------------------------------------------- Physical Exam Details Patient Name: Kara [name], [name]  Date of Service: 03/29/2019 9:00 AM Medical Record Number: [id number] Patient Account Number: [id number] Date of Birth/Sex: May 02, 1951 (68 y.o. F) Treating RN: Army Melia Primary Care Provider: Tracie Harrier Other Clinician: Referring Provider: Tracie Harrier Treating Provider/Extender: STONE III, Lauri Purdum Weeks in Treatment: 87 Constitutional Well-nourished and  well-hydrated in no acute distress. Respiratory normal breathing without difficulty. clear to auscultation bilaterally. Cardiovascular regular rate and rhythm with normal S1, S2. Psychiatric this patient is able to make decisions and demonstrates good insight into disease process. Alert and Oriented x 3. pleasant and cooperative. Notes Patient's wound bed currently showed signs of good epithelialization at all wound locations she does have some edema noted at this point which is going to require her to wear compression stockings but other than this I see no issues currently. Electronic Signature(s) Signed: 03/29/2019 9:33:18 AM By: Worthy Keeler PA-C Entered By: Worthy Keeler on 03/29/2019 09:33:17 Kara [name], [name] ([id number]) -------------------------------------------------------------------------------- Physician Orders Details Patient Name: Kara [name], [name] Date of Service: 03/29/2019 9:00 AM Medical Record Number: [id number] Patient Account Number: [id number] Date of Birth/Sex: 1950-11-29 (68 y.o. 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  ([id number]) -------------------------------------------------------------------------------- Problem List Details Patient Name: Kara [name], [name] Date of Service: 03/29/2019 9:00 AM Medical Record Number: [id number] Patient Account Number: [id number] Date of Birth/Sex: September 20, 1950 (68 y.o. F) Treating RN: Army Melia Primary Care Provider: Tracie Harrier Other Clinician: Referring Provider: Tracie Harrier Treating Provider/Extender: Melburn Hake, Deneka Greenwalt Weeks in Treatment: 12 Active Problems ICD-10 Evaluated Encounter Code Description Active Date Today Diagnosis E11.9 Type 2 diabetes mellitus without complications 0/81/4481 No Yes L97.222 Non-pressure chronic ulcer of left calf with fat layer exposed 01/03/2019 No Yes L97.812 Non-pressure chronic ulcer of other part of right lower leg 02/02/2019 No Yes with fat layer exposed L02.416 Cutaneous abscess of left lower limb 02/15/2019 No Yes Inactive Problems Resolved Problems Electronic Signature(s) Signed: 03/29/2019 8:58:36 AM By: Worthy Keeler PA-C Entered By: Worthy Keeler on 03/29/2019 08:58:36 JACKSON-Bordas, Berneta K. ([id number]) -------------------------------------------------------------------------------- Progress Note Details Patient Name: Kara [name], [name] Date of Service: 03/29/2019 9:00 AM Medical Record Number: [id number] Patient Account Number: [id number] Date of Birth/Sex: 06-Jun-1950 (68 y.o. F) Treating RN: Army Melia Primary Care Provider: Tracie Harrier Other Clinician: Referring Provider: Tracie Harrier Treating Provider/Extender: Melburn Hake, Elihu Milstein Weeks in Treatment: 12 Subjective Chief Complaint Information obtained from Patient Left Leg ulcer x 2 weeks History of Present Illness (HPI) Pleasant 69 year old Caucasian female who fell down a few steps in her home 2 weeks ago and developed bruises on both legs on the shins, on the left shin area she did have an open area that scabbed,  patient did ED on 8/1 at which point she had dressing applied to the left shin wound, she had a right shin hematoma that was lanced with oozing of blood after. Since that time patient has noticed  that the left calf leg wound developed a black covering on top and had started to hurt, she has been dressing this daily with an antibiotic ointment and gauze wrap. 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 She had a traumatic wound on the left lower extremity. We have been using silver alginate because of drainage. She has been in compression 8/26; this was initially a traumatic wound on the left lower extremity. It has some depth superiorly appears to have come in this week. We have been using silver alginate because of drainage 01/24/19 once again the wrap seems to have come down and she took it off and put Vaseline on the wound on Sunday. She did not call the clinic. About 75% of this is fully granulated 25% is not and that is superiorly. We have been using silver alginate but all changed to silver collagen today. 02/01/19 on evaluation today patient appears to be doing okay in regard to her left anterior lower extremity ulcer. She also has a right lower leg ulcer which really has not been monitored as much at this point due to the fact that it appeared to be doing quite well according to what that [facility] felt. Nonetheless there appears to be some fluid that is leaking  from the right side as well upon evaluation today. I think this may need to be addressed at this point unfortunately. I don't think it's just gonna heal on its own at least not very effectively. No fevers, chills, nausea, or vomiting noted at this time. 02/09/2019 today patient appears to be doing a little better in regard to her left lower extremity. On the right lower extremity she actually does have some necrotic tissue noted at this point. Both areas do seem to be draining quite a bit at this time. There is no sign of active infection at this point. 02/15/2019 on evaluation today patient unfortunately is having some issues today with a abscess on the medial aspect of her left lower extremity which is new since last week. She states that this actually occurred on Friday she had a blister initially that she took her wrap off and tried to pop. Subsequently since that time it is gotten worse become more painful and appears to be much more infected. There does not appear to be any signs of active systemic infection which is good news. No fever chills noted. She actually called into the office on Tuesday at which point I sent in a prescription for Keflex until she came in today for evaluation. Apparently she called yesterday as well asking what she can do about the pain and was told to take Tylenol as well. Currently she states that this unfortunately is exquisitely tender even to light touch. Unfortunately I do believe this is a much deeper abscess that is even apparent she has a lot of necrotic/hyper granular tissue noted on the surface of the wound. 02/23/2019 upon evaluation  today patient actually appears to be doing slightly better compared to last evaluation. I did review her wound culture today which showed that she was positive for MRSA. This is sensitive to clindamycin and tetracycline orally Kara [name], [name] ([id number]) only. Fortunately there is no signs of active infection at this  time systemically which is good news. No fevers, chills, nausea, vomiting, or diarrhea. 03/01/2019 on evaluation today patient appears to be doing well with regard to her lower extremity wounds. Her infection seems to be under much better control than previously noted which is excellent news. With that being said I still feel like that though she is doing a lot better she still has a little better time to go as far as getting these wounds to completely close. Fortunately there is no signs of active infection at this time. No fever chills noted. 03/08/2019 on evaluation today patient appears to be doing excellent with regard to her wounds. All are measuring significantly smaller which is great news and I do believe she is doing quite well. Fortunately there is no evidence of active infection at this time. No fevers, chills, nausea, vomiting, or diarrhea. 03/15/2019 on evaluation today patient actually appears to be doing very well with regard to her wounds. She has been tolerating the dressing changes without complication. Fortunately her wounds are showing signs of healing in fact to the areas have completely healed the other is doing significantly better. Overall I am extremely pleased with the progress she is made. 03/29/2019 on evaluation today patient actually appears to be doing excellent at this time. She has been tolerating the dressing changes without complication. Fortunately there is no evidence of active infection at this point. She does have some swelling regard to her lower extremities but again I think this could be managed by compression stockings she does actually have some at home I told her to use those. Patient History Information obtained from Patient. Family History Diabetes - Siblings, No family history of Cancer, Heart Disease, Hereditary Spherocytosis, Hypertension, Kidney Disease, Lung Disease, Seizures, Stroke, Thyroid Problems, Tuberculosis. Social History Never  smoker, Marital Status - Married, Alcohol Use - Never, Drug Use - No History, Caffeine Use - Daily. Medical History Respiratory Denies history of Aspiration, Asthma, Chronic Obstructive Pulmonary Disease (COPD), Pneumothorax, Sleep Apnea, Tuberculosis Cardiovascular Patient has history of Congestive Heart Failure, Deep Vein Thrombosis, Hypertension Denies history of Angina, Arrhythmia, Coronary Artery Disease, Hypotension, Myocardial Infarction, Peripheral Arterial Disease, Peripheral Venous Disease, Phlebitis, Vasculitis Endocrine Patient has history of Type II Diabetes Denies history of Type I Diabetes Integumentary (Skin) Denies history of History of Burn, History of pressure wounds Musculoskeletal Patient has history of Osteoarthritis Denies history of Gout, Rheumatoid Arthritis, Osteomyelitis Neurologic Patient has history of Neuropathy Denies history of Dementia, Quadriplegia, Paraplegia, Seizure Disorder Medical And Surgical History Notes Respiratory hx pulmonary embolism Musculoskeletal OP Review of Systems (ROS) Kara [name], Danaiya K. ([id number]) Constitutional Symptoms (General Health) Denies complaints or symptoms of Fatigue, Fever, Chills, Marked Weight Change. Respiratory Denies complaints or symptoms of Chronic or frequent coughs, Shortness of Breath. Cardiovascular Denies complaints or symptoms of Chest pain, LE edema. Psychiatric Denies complaints or symptoms of Anxiety, Claustrophobia. Objective Constitutional Well-nourished and well-hydrated in no acute distress. Vitals Time Taken: 9:02 AM, Height: 63 in, Weight: 227 lbs, BMI: 40.2, Temperature: 98.1 F, Pulse: 73 bpm, Respiratory Rate: 18 breaths/min, Blood Pressure: 154/71 mmHg. Respiratory normal breathing without difficulty. clear to auscultation bilaterally. Cardiovascular regular rate and rhythm with normal S1, S2. Psychiatric this patient  is able to make decisions and demonstrates good insight  into disease process. Alert and Oriented x 3. pleasant and cooperative. General Notes: Patient's wound bed currently showed signs of good epithelialization at all wound locations she does have some edema noted at this point which is going to require her to wear compression stockings but other than this I see no issues currently. Integumentary (Hair, Skin) Wound #3 status is Open. Original cause of wound was Gradually Appeared. The wound is located on the Left,Medial,Posterior Lower Leg. The wound measures 0cm length x 0cm width x 0cm depth; 0cm^2 area and 0cm^3 volume. There is no tunneling or undermining noted. There is a none present amount of drainage noted. The wound margin is distinct with the outline attached to the wound base. There is large (67-100%) red, pink granulation within the wound bed. There is no necrotic tissue within the wound bed. Assessment Active Problems ICD-10 Type 2 diabetes mellitus without complications Non-pressure chronic ulcer of left calf with fat layer exposed JACKSON-Yohe, Haille K. ([id number]) Non-pressure chronic ulcer of other part of right lower leg with fat layer exposed Cutaneous abscess of left lower limb Plan Discharge From [facility] Services: Discharge from [place] complete 1. I would recommend currently that we discontinue wound care services as the patient is completely healed. 2. I am also going to suggest at this point that we go ahead and have her wear compression stockings to help with her lower extremity edema that was evident today. I do believe that this will keep things under control and help prevent any new wounds from occurring in the future. Patient will follow-up as needed here in the clinic. Electronic Signature(s) Signed: 03/29/2019 9:33:43 AM By: Worthy Keeler PA-C Entered By: Worthy Keeler on 03/29/2019 09:33:42 JACKSON-Ogborn, Lashann K.  ([id number]) -------------------------------------------------------------------------------- ROS/PFSH Details Patient Name: JACKSON-Ghanem, Gustavia K. Date of Service: 03/29/2019 9:00 AM Medical Record Number: [id number] Patient Account Number: [id number] Date of Birth/Sex: [birth year] (68 y.o. F) Treating RN: Army Melia Primary Care Provider: Tracie Harrier Other Clinician: Referring Provider: Tracie Harrier Treating Provider/Extender: Melburn Hake, Aylanie Cubillos Weeks in Treatment: 12 Information Obtained From Patient Constitutional Symptoms (General Health) Complaints and Symptoms: Negative for: Fatigue; Fever; Chills; Marked Weight Change Respiratory Complaints and Symptoms: Negative for: Chronic or frequent coughs; Shortness of Breath Medical History: Negative for: Aspiration; Asthma; Chronic Obstructive Pulmonary Disease (COPD); Pneumothorax; Sleep Apnea; Tuberculosis Past Medical History Notes: hx pulmonary embolism Cardiovascular Complaints and Symptoms: Negative for: Chest pain; LE edema Medical History: Positive for: Congestive Heart Failure; Deep Vein Thrombosis; Hypertension Negative for: Angina; Arrhythmia; Coronary Artery Disease; Hypotension; Myocardial Infarction; Peripheral Arterial Disease; Peripheral Venous Disease; Phlebitis; Vasculitis Psychiatric Complaints and Symptoms: Negative for: Anxiety; Claustrophobia Endocrine Medical History: Positive for: Type II Diabetes Negative for: Type I Diabetes Time with diabetes: 5 years Treated with: Oral agents Blood sugar tested every day: Yes Tested : QD Integumentary (Skin) Medical History: Negative for: History of Burn; History of pressure wounds Musculoskeletal JACKSON-Bord, Annalisa K. ([id number]) Medical History: Positive for: Osteoarthritis Negative for: Gout; Rheumatoid Arthritis; Osteomyelitis Past Medical History Notes: OP Neurologic Medical History: Positive for: Neuropathy Negative for: Dementia;  Quadriplegia; Paraplegia; Seizure Disorder Immunizations Pneumococcal Vaccine: Received Pneumococcal Vaccination: Yes Implantable Devices None Family and Social History Cancer: No; Diabetes: Yes - Siblings; Heart Disease: No; Hereditary Spherocytosis: No; Hypertension: No; Kidney Disease: No; Lung Disease: No; Seizures: No; Stroke: No; Thyroid Problems: No; Tuberculosis: No; Never smoker; Marital Status - Married; Alcohol Use: Never; Drug Use: No  History; Caffeine Use: Daily; Financial Concerns: No; Food, Clothing or Shelter Needs: No; Support System Lacking: No; Transportation Concerns: No Physician Affirmation I have reviewed and agree with the above information. Electronic Signature(s) Signed: 03/29/2019 10:12:12 AM By: Army Melia Signed: 03/29/2019 6:06:55 PM By: Worthy Keeler PA-C Entered By: Worthy Keeler on 03/29/2019 09:32:39 JACKSON-Joe, Mardene K. ([id number]) -------------------------------------------------------------------------------- SuperBill Details Patient Name: JACKSON-Dorgan, Nedra K. Date of Service: 03/29/2019 Medical Record Number: [id number] Patient Account Number: [id number] Date of Birth/Sex: Mar 13, 1951 (68 y.o. 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 L02.416 Cutaneous abscess of left lower limb Facility Procedures CPT4 Code: [id number] Description: 99213 - WOUND CARE VISIT-LEV 3 EST PT Modifier: Quantity: 1 Physician Procedures CPT4 Code Description: [id number] 99213 - WC PHYS LEVEL 3 - EST PT ICD-10 Diagnosis Description E11.9 Type 2 diabetes  mellitus without complications E67.544 Non-pressure chronic ulcer of left calf with fat layer exposed L97.812 Non-pressure chronic ulcer of  other part of right lower leg wi L02.416 Cutaneous abscess of left lower limb Modifier: th fat layer expo Quantity: 1 sed Electronic Signature(s) Signed: 03/29/2019 9:33:52 AM By: Worthy Keeler PA-C Entered By: Worthy Keeler on 03/29/2019 09:33:51

## 2019-04-01 NOTE — Progress Notes (Signed)
Pain Management Virtual Encounter Note - Virtual Visit via Telephone Telehealth (real-time audio visits between healthcare provider and patient).   Patient's Phone No. & Preferred Pharmacy:  [phone] (home); [phone] (mobile); (Preferred) [phone] [email or web address]  [place], [place] [address] Phone: [phone] Fax: [phone]    Pre-screening note:  Our staff contacted Kara [name] and offered Kara [name] an "in person", "face-to-face" appointment versus a telephone encounter. She indicated preferring the telephone encounter, at this time.   Reason for Virtual Visit: COVID-19*  Social distancing based on CDC and AMA recommendations.   I contacted Kara [name] on 04/02/2019 via telephone.      I clearly identified myself as Gaspar Cola, MD. I verified that I was speaking with the correct person using two identifiers (Name: Kara [name], and date of birth: [birth year]).  Advanced Informed Consent I sought verbal advanced consent from Kara [name] for virtual visit interactions. I informed Kara [name] of possible security and privacy concerns, risks, and limitations associated with providing "not-in-person" medical evaluation and management services. I also informed Kara [name] of the availability of "in-person" appointments. Finally, I informed Kara [name] that there would be a charge for the virtual visit and that she could be  personally, fully or partially, financially responsible for it. Kara [name] expressed understanding and agreed to proceed.   Historic Elements   Kara [name] is a 68 y.o. year old, female patient evaluated today after Kara [name] last encounter by our practice on 03/28/2019. 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 She also  has a past surgical history that includes Knee surgery; Carpal tunnel release (Bilateral); Colon resection sigmoid (N/A, 11/02/2016); Colostomy (N/A, 11/02/2016); Sigmoidoscopy (N/A, 11/13/2016); PULMONARY VENOGRAPHY (N/A, 11/19/2016); Colonoscopy with propofol (N/A, 02/03/2017); Colon surgery (11/02/2016); IVC FILTER INSERTION (Right, 10/2016); Colostomy reversal (N/A, 02/24/2017); Appendectomy (N/A, 02/24/2017); Lysis of adhesion (N/A, 02/24/2017); laparoscopy (N/A, 02/24/2017); Ileo loop diversion (N/A, 02/24/2017); Sigmoidoscopy (N/A, 05/19/2017); Ileostomy closure (N/A, 06/01/2017); IVC FILTER REMOVAL (N/A, 08/09/2017); Sacroplasty (N/A, 11/08/2017); Cataract extraction w/PHACO (Left, 11/21/2018); and Cataract extraction w/PHACO (Right, 12/12/2018). Kara [name] has a current medication list which includes the following prescription(s): aspirin ec, calcium polycarbophil, candesartan, cyclobenzaprine, dulaglutide, gabapentin, glipizide, meclizine, multi-vitamins, ropinirole, warfarin, warfarin, and hydrocodone-acetaminophen. She  reports that she has never smoked. She has never used smokeless tobacco. She reports that she does not drink alcohol or use drugs. Kara [name] is allergic to adhesive [tape]; other; latex; and morphine and related.   HPI  Today, she is being contacted for a post-procedure assessment.  The patient indicates that the diagnostic bilateral sacroiliac joint block did not provide Kara [name] the type of benefits that she gets with the lumbar facet blocks.  This would indicate that despite the apparent positive physical exam suggesting sacroiliac joint involvement, the diagnostic injection has proven it not to contribute significantly to the patient's pain.  Today I asked the patient to hyperextend and rotate and again she  confirmed that that is what is triggering Kara [name] pain.  She describes Kara [name] current pain as being primarily in the lower back with the right side being worse and referring pain towards the left side.  She also has pain referred towards the right leg, but through the posterior aspect down  only to mid hamstring, not quite going all the way down to the back of the knee.  This type of pain pattern is usually referred from the facet joints.  Today she has requested that we bring Kara [name] in for a repeat palliative lumbar facet block.  Kara [name] last 1 was done on 02/08/2019.  She would definitely improve significantly if we could have Kara [name] bring Kara [name] BMI closer to 30.  Post-Procedure Evaluation  Procedure: Diagnostic bilateral sacroiliac joint block #1 under fluoroscopic guidance, no sedation Pre-procedure pain level: 8/10 Post-procedure: 0/10 (100% relief)  Sedation: None.  Pain relief after procedure (treated area only): (Questions asked to patient) 1. About 15 minutes after the procedure, while the area was still numb from the local anesthetics, were you having any pain in the area? First 1 hour: 100 % better. Initial 4-6 hours: 100 % better. 2. How many days was the area numb? Any benefit longer than 6 hours: 100 % better. 3. How much better is your pain now, when compared to before the procedure? Current benefit: 0 reports pain was better for the next 7 -8 days  % better. 4. Can you move better now? Improvement in ROM (Range of Motion): No. 5. Can you do more now? Improvement in function: No. 4. Did you have any problems with the procedure? Side-effects/Complications: No.  Current benefits: Defined as benefit that persist at this time.   Analgesia:  Back to baseline Function: Back to baseline ROM: Back to baseline  Pharmacotherapy Assessment  Analgesic: Hydrocodone/APAP 5/325, 1 tab PO QD PRN (5 mg/day of hydrocodone) (#60 was lasting Kara [name] 4-5 mo. Last filled on 02/08/2019) as of today 02/28/2019, he still  has 25 of the 30 pills left.  She indicates not needing a refill at this time. MME: 5 mg/day.   Monitoring: Pharmacotherapy: No side-effects or adverse reactions reported. [place] PMP: PDMP reviewed during this encounter.       Compliance: No problems identified. Effectiveness: Clinically acceptable. Plan: Refer to "POC".  UDS: No results found for: SUMMARY Laboratory Chemistry Profile (12 mo)  Renal: 12/23/2018: BUN 12; Creatinine, Ser 0.81  Lab Results  Component Value Date   GFRAA >60 12/23/2018   GFRNONAA >60 12/23/2018   Hepatic: 12/23/2018: Albumin 3.5 Lab Results  Component Value Date   AST 30 12/23/2018   ALT 36 12/23/2018   Other: No results found for requested labs within last 8760 hours. Note: Above Lab results reviewed.  Imaging  Last 90 days:  Dg Pain Clinic C-arm 1-60 Min No Report  Result Date: 03/08/2019 Fluoro was used, but no Radiologist interpretation will be provided. Please refer to "NOTES" tab for provider progress note.  Dg Pain Clinic C-arm 1-60 Min No Report  Result Date: 02/08/2019 Fluoro was used, but no Radiologist interpretation will be provided. Please refer to "NOTES" tab for provider progress note.   Assessment  The primary encounter diagnosis was Lumbar facet syndrome (Bilateral). Diagnoses of Lumbar facet arthropathy (L3-4, L4-5, and L5-S1) (Bilateral), Chronic low back pain (Primary Area of Pain) (Bilateral), Chronic sacroiliac joint pain (Left), Other specified dorsopathies, sacral and sacrococcygeal region, and Chronic hip pain (Left) were also pertinent to this visit.  Plan of Care  I have discontinued Hiliary K. Jackson-Mussa's clindamycin. I am also having Kara [name] maintain Kara [name] aspirin EC, cyclobenzaprine, Multi-Vitamins, glipiZIDE, candesartan, gabapentin, meclizine, rOPINIRole, warfarin, warfarin, Calcium Polycarbophil (FIBER-CAPS PO), HYDROcodone-acetaminophen, and Dulaglutide.  Pharmacotherapy (Medications Ordered): No orders of the defined  types were placed in this encounter.  Orders:  Orders Placed This Encounter  Procedures  . L-FCT Blk (Schedule)    Standing Status:   Future    Standing Expiration Date:   05/02/2019    Scheduling Instructions:     Procedure: Lumbar facet block (AKA.: Lumbosacral medial branch nerve block)     Side: Bilateral     Level: L3-4, L4-5, & L5-S1 Facets (L2, L3, L4, L5, & S1 Medial Branch Nerves)     Sedation: No Sedation.     Timeframe: ASAP    Order Specific Question:   Where will this procedure be performed?    Answer:   [name]   Follow-up plan:   Return for Procedure (no sedation): (B) L-FCT BLK, (ASAP).      Interventional management options:  Considering:   NOTE:COUMADINANTICOAGULATION(Stop:5days  Re-start: 2hrs) NO RFAuntil BMIis <35. Diagnostic L5-S1 LESI Diagnostic bilateral L3TFESI Diagnostic bilateral L4TFESI Diagnostic bilateral L5 TFESI Diagnostic left SI joint block #1  Possible righ SI joint RFA Possible bilateral lumbar facet RFA(NO RFAuntil BMIis <35.) Diagnostic right IA hip joint injection Diagnostic right femoral +ObturatorNB Possible right femoral + obturator nerve RFA   Palliative PRN treatment(s):   Palliative/Therapeutic left L4-5 LESI #2 Diagnostic left lumbar facet block#4 Palliativeright lumbar facet block(s) #8 Palliative right SI joint block #4(not as effective as facet blocks) Diagnostic left SI joint block #2 (not as effective in controlling the low back pain as the facet injections) Palliative/Therapeutic (Midline) caudalESI#4    Recent Visits Date Type Provider Dept  03/08/19 Procedure visit Milinda Pointer, MD [name]  02/28/19 Office Visit Milinda Pointer, MD [name]  02/08/19 Procedure visit Milinda Pointer, MD [name]  02/05/19 Office Visit Milinda Pointer, MD [name]  Showing recent visits within past 90 days and meeting all  other requirements   Today's Visits Date Type Provider Dept  04/02/19 Telemedicine Milinda Pointer, MD [name]  Showing today's visits and meeting all other requirements   Future Appointments No visits were found meeting these conditions.  Showing future appointments within next 90 days and meeting all other requirements   I discussed the assessment and treatment plan with the patient. The patient was provided an opportunity to ask questions and all were answered. The patient agreed with the plan and demonstrated an understanding of the instructions.  Patient advised to call back or seek an in-person evaluation if the symptoms or condition worsens.  Total duration of non-face-to-face encounter: 13 minutes.  Note by: Gaspar Cola, MD Date: 04/02/2019; Time: 12:07 PM  Note: This dictation was prepared with Dragon dictation. Any transcriptional errors that may result from this process are unintentional.  Disclaimer:  * Given the special circumstances of the COVID-19 pandemic, the federal government has announced that the Office for Civil Rights (OCR) will exercise its enforcement discretion and will not impose penalties on physicians using telehealth in the event of noncompliance with regulatory requirements under the [place] (HIPAA) in connection with the good faith provision of telehealth during the RSWNI-62 national public health emergency. ([place])

## 2019-04-02 ENCOUNTER — Other Ambulatory Visit: Payer: Self-pay

## 2019-04-02 ENCOUNTER — Ambulatory Visit: Payer: PPO | Attending: Pain Medicine | Admitting: Pain Medicine

## 2019-04-02 DIAGNOSIS — M25552 Pain in left hip: Secondary | ICD-10-CM | POA: Diagnosis not present

## 2019-04-02 DIAGNOSIS — M533 Sacrococcygeal disorders, not elsewhere classified: Secondary | ICD-10-CM | POA: Diagnosis not present

## 2019-04-02 DIAGNOSIS — M545 Low back pain, unspecified: Secondary | ICD-10-CM

## 2019-04-02 DIAGNOSIS — M5388 Other specified dorsopathies, sacral and sacrococcygeal region: Secondary | ICD-10-CM

## 2019-04-02 DIAGNOSIS — Z20822 Contact with and (suspected) exposure to covid-19: Secondary | ICD-10-CM

## 2019-04-02 DIAGNOSIS — M47816 Spondylosis without myelopathy or radiculopathy, lumbar region: Secondary | ICD-10-CM | POA: Diagnosis not present

## 2019-04-02 DIAGNOSIS — G8929 Other chronic pain: Secondary | ICD-10-CM | POA: Diagnosis not present

## 2019-04-03 ENCOUNTER — Telehealth: Payer: Self-pay

## 2019-04-03 ENCOUNTER — Ambulatory Visit: Payer: PPO | Admitting: Pain Medicine

## 2019-04-03 LAB — NOVEL CORONAVIRUS, NAA: SARS-CoV-2, NAA: NOT DETECTED

## 2019-04-03 NOTE — Telephone Encounter (Signed)
Pt called for covid test results. Discussed that results are not back but when they come back.

## 2019-04-10 ENCOUNTER — Ambulatory Visit
Admission: RE | Admit: 2019-04-10 | Discharge: 2019-04-10 | Disposition: A | Discharge status: Home / Self Care | Payer: PPO | Source: Ambulatory Visit | Attending: Pain Medicine | Admitting: Pain Medicine

## 2019-04-10 ENCOUNTER — Ambulatory Visit (HOSPITAL_BASED_OUTPATIENT_CLINIC_OR_DEPARTMENT_OTHER): Payer: PPO | Admitting: Pain Medicine

## 2019-04-10 ENCOUNTER — Encounter: Payer: Self-pay | Admitting: Pain Medicine

## 2019-04-10 ENCOUNTER — Other Ambulatory Visit: Payer: Self-pay

## 2019-04-10 VITALS — BP 111/74 | HR 87 | Temp 97.5°F | Resp 20 | Ht 63.0 in | Wt 227.0 lb

## 2019-04-10 DIAGNOSIS — M5137 Other intervertebral disc degeneration, lumbosacral region: Secondary | ICD-10-CM | POA: Insufficient documentation

## 2019-04-10 DIAGNOSIS — M5441 Lumbago with sciatica, right side: Secondary | ICD-10-CM | POA: Diagnosis not present

## 2019-04-10 DIAGNOSIS — M47816 Spondylosis without myelopathy or radiculopathy, lumbar region: Secondary | ICD-10-CM

## 2019-04-10 DIAGNOSIS — Z91040 Latex allergy status: Secondary | ICD-10-CM

## 2019-04-10 DIAGNOSIS — G8929 Other chronic pain: Secondary | ICD-10-CM

## 2019-04-10 DIAGNOSIS — M4306 Spondylolysis, lumbar region: Secondary | ICD-10-CM

## 2019-04-10 DIAGNOSIS — Z7901 Long term (current) use of anticoagulants: Secondary | ICD-10-CM

## 2019-04-10 DIAGNOSIS — M47817 Spondylosis without myelopathy or radiculopathy, lumbosacral region: Secondary | ICD-10-CM | POA: Insufficient documentation

## 2019-04-10 DIAGNOSIS — M5442 Lumbago with sciatica, left side: Secondary | ICD-10-CM | POA: Diagnosis not present

## 2019-04-10 MED ORDER — LIDOCAINE HCL 2 % IJ SOLN
20.0000 mL | Freq: Once | INTRAMUSCULAR | Status: AC
  Administered 2019-04-10: 400 mg

## 2019-04-10 MED ORDER — TRIAMCINOLONE ACETONIDE 40 MG/ML IJ SUSP
80.0000 mg | Freq: Once | INTRAMUSCULAR | Status: AC
  Administered 2019-04-10: 08:00:00 80 mg

## 2019-04-10 MED ORDER — ROPIVACAINE HCL 2 MG/ML IJ SOLN
18.0000 mL | Freq: Once | INTRAMUSCULAR | Status: AC
  Administered 2019-04-10: 18 mL via PERINEURAL

## 2019-04-10 MED ORDER — TRIAMCINOLONE ACETONIDE 40 MG/ML IJ SUSP
INTRAMUSCULAR | Status: AC
  Filled 2019-04-10: qty 2

## 2019-04-10 MED ORDER — LIDOCAINE HCL 2 % IJ SOLN
INTRAMUSCULAR | Status: AC
  Filled 2019-04-10: qty 20

## 2019-04-10 MED ORDER — ROPIVACAINE HCL 2 MG/ML IJ SOLN
INTRAMUSCULAR | Status: AC
  Filled 2019-04-10: qty 10

## 2019-04-10 NOTE — Patient Instructions (Signed)

## 2019-04-10 NOTE — Progress Notes (Signed)
Patient's Name: Kara [name]  MRN: [id number]  Referring Provider: Tracie Harrier, MD  DOB: [birth year]  PCP: Tracie Harrier, MD  DOS: 04/10/2019  Note by: Gaspar Cola, MD  Service setting: Ambulatory outpatient  Specialty: Interventional Pain Management  Patient type: Established  Location: [name] (AMB) Pain Management Facility  Visit type: Interventional Procedure   Primary Reason for Visit: Interventional Pain Management Treatment. CC: Back Pain (low)  Procedure:          Anesthesia, Analgesia, Anxiolysis:  Type: Lumbar Facet, Medial Branch Block(s)          Primary Purpose: Palliative Region: Posterolateral Lumbosacral Spine Level: L2, L3, L4, L5, & S1 Medial Branch Level(s). Injecting these levels blocks the L3-4, L4-5, and L5-S1 lumbar facet joints. Laterality: Bilateral  Type: Local Anesthesia Indication(s): Analgesia         Route: Infiltration ([place]/IM) IV Access: Declined Sedation: Declined  Local Anesthetic: Lidocaine 1-2%  Position: Prone   Indications: 1. Lumbar facet syndrome (Bilateral)   2. Spondylosis without myelopathy or radiculopathy, lumbosacral region   3. Lumbar facet arthropathy (L3-4, L4-5, and L5-S1) (Bilateral)   4. 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 DDD (degenerative disc disease), lumbosacral   6. Chronic low back pain (Primary Area of Pain) (Bilateral) w/ sciatica (Bilateral)   7. Chronic anticoagulation (COUMADIN)   8. History of allergy to latex    Pain Score: Pre-procedure: 10-Worst pain ever/10 Post-procedure: 0-No pain/10   Pre-op Assessment:  Kara [name] is a 68 y.o. (year old), female patient, seen today for interventional treatment. She  has a past surgical history that includes Knee surgery; Carpal tunnel release (Bilateral); Colon resection sigmoid (N/A, 11/02/2016); Colostomy (N/A, 11/02/2016); Sigmoidoscopy (N/A, 11/13/2016); PULMONARY VENOGRAPHY (N/A, 11/19/2016); Colonoscopy with propofol (N/A,  02/03/2017); Colon surgery (11/02/2016); IVC FILTER INSERTION (Right, 10/2016); Colostomy reversal (N/A, 02/24/2017); Appendectomy (N/A, 02/24/2017); Lysis of adhesion (N/A, 02/24/2017); laparoscopy (N/A, 02/24/2017); Ileo loop diversion (N/A, 02/24/2017); Sigmoidoscopy (N/A, 05/19/2017); Ileostomy closure (N/A, 06/01/2017); IVC FILTER REMOVAL (N/A, 08/09/2017); Sacroplasty (N/A, 11/08/2017); Cataract extraction w/PHACO (Left, 11/21/2018); and Cataract extraction w/PHACO (Right, 12/12/2018). Kara [name] has a current medication list which includes the following prescription(s): aspirin ec, calcium polycarbophil, candesartan, cyclobenzaprine, dulaglutide, gabapentin, glipizide, meclizine, multi-vitamins, ropinirole, warfarin, warfarin, and hydrocodone-acetaminophen. Her primarily concern today is the Back Pain (low)  Initial Vital Signs:  Pulse/HCG Rate: 87ECG Heart Rate: 86 Temp: (!) 97.5 F (36.4 C) Resp: 18 BP: (!) 149/83 SpO2: 100 %  BMI: Estimated body mass index is 40.21 kg/m as calculated from the following:   Height as of this encounter: 5\' 3"  (1.6 m).   Weight as of this encounter: 227 lb (103 kg).  Risk Assessment: Allergies: Reviewed. She is allergic to adhesive [tape]; other; latex; and morphine and related.  Allergy Precautions: None required Coagulopathies: Reviewed. None identified.  Blood-thinner therapy: None at this time Active Infection(s): Reviewed. None identified. Kara [name] is afebrile  Site Confirmation: Kara [name] was asked to confirm the procedure and laterality before marking the site Procedure checklist: Completed Consent: Before the procedure and under the influence of no sedative(s), amnesic(s), or anxiolytics, the patient was informed of the treatment options, risks and possible complications. To fulfill our ethical and legal obligations, as recommended by the American Medical Association's Code of Ethics, I have informed the patient of my  clinical impression; the nature and purpose of the treatment or procedure; the risks, benefits, and possible complications of the intervention; the alternatives, including doing nothing; the risk(s) and  benefit(s) of the alternative treatment(s) or procedure(s); and the risk(s) and benefit(s) of doing nothing. The patient was provided information about the general risks and possible complications associated with the procedure. These may include, but are not limited to: failure to achieve desired goals, infection, bleeding, organ or nerve damage, allergic reactions, paralysis, and death. In addition, the patient was informed of those risks and complications associated to Spine-related procedures, such as failure to decrease pain; infection (i.e.: Meningitis, epidural or intraspinal abscess); bleeding (i.e.: epidural hematoma, subarachnoid hemorrhage, or any other type of intraspinal or peri-dural bleeding); organ or nerve damage (i.e.: Any type of peripheral nerve, nerve root, or spinal cord injury) with subsequent damage to sensory, motor, and/or autonomic systems, resulting in permanent pain, numbness, and/or weakness of one or several areas of the body; allergic reactions; (i.e.: anaphylactic reaction); and/or death. Furthermore, the patient was informed of those risks and complications associated with the medications. These include, but are not limited to: allergic reactions (i.e.: anaphylactic or anaphylactoid reaction(s)); adrenal axis suppression; blood sugar elevation that in diabetics may result in ketoacidosis or comma; water retention that in patients with history of congestive heart failure may result in shortness of breath, pulmonary edema, and decompensation with resultant heart failure; weight gain; swelling or edema; medication-induced neural toxicity; particulate matter embolism and blood vessel occlusion with resultant organ, and/or nervous system infarction; and/or aseptic necrosis of one or  more joints. Finally, the patient was informed that Medicine is not an exact science; therefore, there is also the possibility of unforeseen or unpredictable risks and/or possible complications that may result in a catastrophic outcome. The patient indicated having understood very clearly. We have given the patient no guarantees and we have made no promises. Enough time was given to the patient to ask questions, all of which were answered to the patient's satisfaction. Ms. Degrasse has indicated that she wanted to continue with the procedure. Attestation: I, the ordering provider, attest that I have discussed with the patient the benefits, risks, side-effects, alternatives, likelihood of achieving goals, and potential problems during recovery for the procedure that I have provided informed consent. Date   Time: 04/10/2019  7:58 AM  Pre-Procedure Preparation:  Monitoring: As per clinic protocol. Respiration, ETCO2, SpO2, BP, heart rate and rhythm monitor placed and checked for adequate function Safety Precautions: Patient was assessed for positional comfort and pressure points before starting the procedure. Time-out: I initiated and conducted the "Time-out" before starting the procedure, as per protocol. The patient was asked to participate by confirming the accuracy of the "Time Out" information. Verification of the correct person, site, and procedure were performed and confirmed by me, the nursing staff, and the patient. "Time-out" conducted as per Joint Commission's Universal Protocol (UP.01.01.01). Time: 1610  Description of Procedure:          Laterality: Bilateral. The procedure was performed in identical fashion on both sides. Levels:  L2, L3, L4, L5, & S1 Medial Branch Level(s) Area Prepped: Posterior Lumbosacral Region Prepping solution: DuraPrep (Iodine Povacrylex [0.7% available iodine] and Isopropyl Alcohol, 74% w/w) Safety Precautions: Aspiration looking for blood return was  conducted prior to all injections. At no point did we inject any substances, as a needle was being advanced. Before injecting, the patient was told to immediately notify me if she was experiencing any new onset of "ringing in the ears, or metallic taste in the mouth". No attempts were made at seeking any paresthesias. Safe injection practices and needle disposal techniques used. Medications properly checked  for expiration dates. SDV (single dose vial) medications used. After the completion of the procedure, all disposable equipment used was discarded in the proper designated medical waste containers. Local Anesthesia: Protocol guidelines were followed. The patient was positioned over the fluoroscopy table. The area was prepped in the usual manner. The time-out was completed. The target area was identified using fluoroscopy. A 12-in long, straight, sterile hemostat was used with fluoroscopic guidance to locate the targets for each level blocked. Once located, the skin was marked with an approved surgical skin marker. Once all sites were marked, the skin (epidermis, dermis, and hypodermis), as well as deeper tissues (fat, connective tissue and muscle) were infiltrated with a small amount of a short-acting local anesthetic, loaded on a 10cc syringe with a 25G, 1.5-in  Needle. An appropriate amount of time was allowed for local anesthetics to take effect before proceeding to the next step. Local Anesthetic: Lidocaine 2.0% The unused portion of the local anesthetic was discarded in the proper designated containers. Technical explanation of process:  L2 Medial Branch Nerve Block (MBB): The target area for the L2 medial branch is at the junction of the postero-lateral aspect of the superior articular process and the superior, posterior, and medial edge of the transverse process of L3. Under fluoroscopic guidance, a Quincke needle was inserted until contact was made with os over the superior postero-lateral aspect of  the pedicular shadow (target area). After negative aspiration for blood, 2.0 mL of the nerve block solution was injected without difficulty or complication. The needle was removed intact. L3 Medial Branch Nerve Block (MBB): The target area for the L3 medial branch is at the junction of the postero-lateral aspect of the superior articular process and the superior, posterior, and medial edge of the transverse process of L4. Under fluoroscopic guidance, a Quincke needle was inserted until contact was made with os over the superior postero-lateral aspect of the pedicular shadow (target area). After negative aspiration for blood, 2.0 mL of the nerve block solution was injected without difficulty or complication. The needle was removed intact. L4 Medial Branch Nerve Block (MBB): The target area for the L4 medial branch is at the junction of the postero-lateral aspect of the superior articular process and the superior, posterior, and medial edge of the transverse process of L5. Under fluoroscopic guidance, a Quincke needle was inserted until contact was made with os over the superior postero-lateral aspect of the pedicular shadow (target area). After negative aspiration for blood, 2.0 mL of the nerve block solution was injected without difficulty or complication. The needle was removed intact. L5 Medial Branch Nerve Block (MBB): The target area for the L5 medial branch is at the junction of the postero-lateral aspect of the superior articular process and the superior, posterior, and medial edge of the sacral ala. Under fluoroscopic guidance, a Quincke needle was inserted until contact was made with os over the superior postero-lateral aspect of the pedicular shadow (target area). After negative aspiration for blood, 2.0 mL of the nerve block solution was injected without difficulty or complication. The needle was removed intact. S1 Medial Branch Nerve Block (MBB): The target area for the S1 medial branch is at the  posterior and inferior 6 o'clock position of the L5-S1 facet joint. Under fluoroscopic guidance, the Quincke needle inserted for the L5 MBB was redirected until contact was made with os over the inferior and postero aspect of the sacrum, at the 6 o' clock position under the L5-S1 facet joint (Target area). After negative  aspiration for blood, 2.0 mL of the nerve block solution was injected without difficulty or complication. The needle was removed intact.  Nerve block solution: 0.2% PF-Ropivacaine + Triamcinolone (40 mg/mL) diluted to a final concentration of 4 mg of Triamcinolone/mL of Ropivacaine The unused portion of the solution was discarded in the proper designated containers. Procedural Needles: 22-gauge, 7-inch, Quincke needles used for all levels.  Once the entire procedure was completed, the treated area was cleaned, making sure to leave some of the prepping solution back to take advantage of its long term bactericidal properties.   Illustration of the posterior view of the lumbar spine and the posterior neural structures. Laminae of L2 through S1 are labeled. DPRL5, dorsal primary ramus of L5; DPRS1, dorsal primary ramus of S1; DPR3, dorsal primary ramus of L3; FJ, facet (zygapophyseal) joint L3-L4; I, inferior articular process of L4; LB1, lateral branch of dorsal primary ramus of L1; IAB, inferior articular branches from L3 medial branch (supplies L4-L5 facet joint); IBP, intermediate branch plexus; MB3, medial branch of dorsal primary ramus of L3; NR3, third lumbar nerve root; S, superior articular process of L5; SAB, superior articular branches from L4 (supplies L4-5 facet joint also); TP3, transverse process of L3.  Vitals:   04/10/19 0833 04/10/19 0838 04/10/19 0842 04/10/19 0846  BP: (!) 147/79 121/67 120/72 111/74  Pulse:      Resp: (!) 21 (!) 25 (!) 25 20  Temp:      SpO2: 96% 96% 96% 99%  Weight:      Height:         Start Time: 0833 hrs. End Time: 0844 hrs.  Imaging  Guidance (Spinal):          Type of Imaging Technique: Fluoroscopy Guidance (Spinal) Indication(s): Assistance in needle guidance and placement for procedures requiring needle placement in or near specific anatomical locations not easily accessible without such assistance. Exposure Time: Please see nurses notes. Contrast: None used. Fluoroscopic Guidance: I was personally present during the use of fluoroscopy. "Tunnel Vision Technique" used to obtain the best possible view of the target area. Parallax error corrected before commencing the procedure. "Direction-depth-direction" technique used to introduce the needle under continuous pulsed fluoroscopy. Once target was reached, antero-posterior, oblique, and lateral fluoroscopic projection used confirm needle placement in all planes. Images permanently stored in EMR. Interpretation: No contrast injected. I personally interpreted the imaging intraoperatively. Adequate needle placement confirmed in multiple planes. Permanent images saved into the patient's record.  Antibiotic Prophylaxis:   Anti-infectives (From admission, onward)   None     Indication(s): None identified  Post-operative Assessment:  Post-procedure Vital Signs:  Pulse/HCG Rate: 8788 Temp: (!) 97.5 F (36.4 C) Resp: 20 BP: 111/74 SpO2: 99 %  EBL: None  Complications: No immediate post-treatment complications observed by team, or reported by patient.  Note: The patient tolerated the entire procedure well. A repeat set of vitals were taken after the procedure and the patient was kept under observation following institutional policy, for this type of procedure. Post-procedural neurological assessment was performed, showing return to baseline, prior to discharge. The patient was provided with post-procedure discharge instructions, including a section on how to identify potential problems. Should any problems arise concerning this procedure, the patient was given instructions to  immediately contact us, at any time, without hesitation. In any case, we plan to contact the patient by telephone for a follow-up status report regarding this interventional procedure.  Comments:  No additional relevant information.  Plan of Care  Orders:  Orders Placed This Encounter  Procedures   L-FCT Blk (Today)    Scheduling Instructions:     Procedure: Lumbar facet block (AKA.: Lumbosacral medial branch nerve block)     Side: Bilateral     Level: L3-4, L4-5, & L5-S1 Facets (L2, L3, L4, L5, & S1 Medial Branch Nerves)     Sedation: Patient's choice.     Timeframe: Today    Order Specific Question:   Where will this procedure be performed?    Answer:   [name]   Fluoro (C-Arm) (<60 min) (No Report)    Intraoperative interpretation by procedural physician at [place].    Standing Status:   Standing    Number of Occurrences:   1    Order Specific Question:   Reason for exam:    Answer:   Assistance in needle guidance and placement for procedures requiring needle placement in or near specific anatomical locations not easily accessible without such assistance.   Care order/instruction: Please confirm that the patient has stopped the Coumadin (Warfarin) X 5 days prior to procedure or surgery.    Please confirm that the patient has stopped the Coumadin (Warfarin) X 5 days prior to procedure or surgery.    Standing Status:   Standing    Number of Occurrences:   1   Block Tray    Equipment required: Single use, disposable, "Block Tray"    Standing Status:   Standing    Number of Occurrences:   1    Order Specific Question:   Specify    Answer:   Block Tray   Consent: L-FCT BLK    Nursing Order: Transcribe to consent form and obtain patient signature. Note: Always confirm laterality of pain with Ms. Stansell, before procedure. Procedure: Lumbar Facet Block  under fluoroscopic guidance Indication/Reason: Low Back Pain, with our without leg pain,  due to Facet Joint Arthralgia (Joint Pain) known as Lumbar Facet Syndrome, secondary to Lumbar, and/or Lumbosacral Spondylosis (Arthritis of the Spine), without myelopathy or radiculopathy (Nerve Damage). Provider Attestation: I, Quintana Dossie Arbour, MD, (Pain Management Specialist), the physician/practitioner, attest that I have discussed with the patient the benefits, risks, side effects, alternatives, likelihood of achieving goals and potential problems during recovery for the procedure that I have provided informed consent.   Bleeding precautions    Standing Status:   Standing    Number of Occurrences:   1   Allergy: LATEX    Activate Latex-Free Protocol.    Standing Status:   Standing    Number of Occurrences:   1   Chronic Opioid Analgesic:  Hydrocodone/APAP 5/325, 1 tab PO QD PRN (5 mg/day of hydrocodone) (#60 was lasting her 4-5 mo. Last filled on 02/08/2019) as of today 02/28/2019, he still has 25 of the 30 pills left.  She indicates not needing a refill at this time. MME: 5 mg/day.   Medications ordered for procedure: Meds ordered this encounter  Medications   lidocaine (XYLOCAINE) 2 % (with pres) injection 400 mg   ropivacaine (PF) 2 mg/mL (0.2%) (NAROPIN) injection 18 mL   triamcinolone acetonide (KENALOG-40) injection 80 mg   Medications administered: We administered lidocaine, ropivacaine (PF) 2 mg/mL (0.2%), and triamcinolone acetonide.  See the medical record for exact dosing, route, and time of administration.  Follow-up plan:   Return in about 2 weeks (around 04/24/2019) for (VV), (PP).       Interventional management options:  Considering:   NOTE:COUMADINANTICOAGULATION(Stop:5days   Re-start: 2hrs) NO  RFAuntil BMIis <35. Diagnostic L5-S1 LESI Diagnostic bilateral L3TFESI Diagnostic bilateral L4TFESI Diagnostic bilateral L5 TFESI Diagnostic left SI joint block #1  Possible righ SI joint RFA Possible bilateral lumbar facet RFA(NO RFAuntil  BMIis <35.) Diagnostic right IA hip joint injection Diagnostic right femoral +ObturatorNB Possible right femoral + obturator nerve RFA   Palliative PRN treatment(s):   Palliative/Therapeutic left L4-5 LESI #2 Diagnostic left lumbar facet block#4 Palliativeright lumbar facet block(s) #8 Palliative right SI joint block #4(not as effective as facet blocks) Diagnostic left SI joint block #2 (not as effective in controlling the low back pain as the facet injections) Palliative/Therapeutic (Midline) caudalESI#4     Recent Visits Date Type Provider Dept  04/02/19 Telemedicine Milinda Pointer, MD [name]  03/08/19 Procedure visit Milinda Pointer, MD [name]  02/28/19 Office Visit Milinda Pointer, MD [name]  02/08/19 Procedure visit Milinda Pointer, MD [name]  02/05/19 Office Visit Milinda Pointer, MD [name]  Showing recent visits within past 90 days and meeting all other requirements   Today's Visits Date Type Provider Dept  04/10/19 Procedure visit Milinda Pointer, MD [name]  Showing today's visits and meeting all other requirements   Future Appointments Date Type Provider Dept  04/30/19 Appointment Milinda Pointer, MD [name]  Showing future appointments within next 90 days and meeting all other requirements   Disposition: Discharge home  Discharge Date & Time: 04/10/2019; 0852 hrs.   Primary Care Physician: Tracie Harrier, MD Location: [facility] Note by: Gaspar Cola, MD Date: 04/10/2019; Time: 9:11 AM  Disclaimer:  Medicine is not an Chief Strategy Officer. The only guarantee in medicine is that nothing is guaranteed. It is important to note that the decision to proceed with this intervention was based on the information collected from the patient. The Data and conclusions were drawn from the patient's  questionnaire, the interview, and the physical examination. Because the information was provided in large part by the patient, it cannot be guaranteed that it has not been purposely or unconsciously manipulated. Every effort has been made to obtain as much relevant data as possible for this evaluation. It is important to note that the conclusions that lead to this procedure are derived in large part from the available data. Always take into account that the treatment will also be dependent on availability of resources and existing treatment guidelines, considered by other Pain Management Practitioners as being common knowledge and practice, at the time of the intervention. For Medico-Legal purposes, it is also important to point out that variation in procedural techniques and pharmacological choices are the acceptable norm. The indications, contraindications, technique, and results of the above procedure should only be interpreted and judged by a Board-Certified Interventional Pain Specialist with extensive familiarity and expertise in the same exact procedure and technique.

## 2019-04-10 NOTE — Progress Notes (Signed)
Safety precautions to be maintained throughout the outpatient stay will include: orient to surroundings, keep bed in low position, maintain call bell within reach at all times, provide assistance with transfer out of bed and ambulation.  

## 2019-04-11 ENCOUNTER — Telehealth: Payer: Self-pay

## 2019-04-11 NOTE — Telephone Encounter (Signed)
Pt was called and she stated that she was little sore last night but is much better today. She will call back if she feel like she needs too.

## 2019-04-13 ENCOUNTER — Telehealth: Payer: Self-pay

## 2019-04-13 ENCOUNTER — Telehealth: Payer: Self-pay | Admitting: *Deleted

## 2019-04-13 NOTE — Telephone Encounter (Signed)
Attempted to return patient call.  LM

## 2019-04-16 ENCOUNTER — Telehealth: Payer: Self-pay | Admitting: *Deleted

## 2019-04-16 ENCOUNTER — Telehealth: Payer: Self-pay | Admitting: Pain Medicine

## 2019-04-16 NOTE — Telephone Encounter (Signed)
Called patient and let her know that she will need an appointment for this. States she will wait until her 04/30/19 follow up to discuss with Dr. Dossie [name].

## 2019-04-16 NOTE — Telephone Encounter (Signed)
Patient called asking if she can get a script for Hydrocodone called in for pain. She has appt scheduled for 04-30-19 follow up to procedure.

## 2019-04-17 DIAGNOSIS — Z7901 Long term (current) use of anticoagulants: Secondary | ICD-10-CM | POA: Diagnosis not present

## 2019-04-17 NOTE — Telephone Encounter (Signed)
Patient wants to wait for 04-30-19 appt

## 2019-04-26 NOTE — Progress Notes (Signed)
Pain relief after procedure (treated area only): (Questions asked to patient) 1. Starting about 15 minutes after the procedure, and "while the area was still numb" (from the local anesthetics), were you having any of your usual pain "in that area" (the treated area)?  (NOTE: NOT including the discomfort from the needle sticks.) First 1 hour: 99% better. 2. Assuming that it did get numb. How long was the area numb? 99 % benefit, longer than 6 hours. How long?2 weeks. 3. How much better is your pain now, when compared to before the procedure? Current benefit:45 % better. 4. Can you move better now? Improvement in ROM (Range of Motion): Yes. 5. Can you do more now? Improvement in function: Yes. 4. Did you have any problems with the procedure? Side-effects/Complications: Yes.

## 2019-04-29 NOTE — Progress Notes (Signed)
Pain Management Virtual Encounter Note - Virtual Visit via Telephone Telehealth (real-time audio visits between healthcare provider and patient).   Patient's Phone No. & Preferred Pharmacy:  [phone] (home); [phone] (mobile); (Preferred) [phone] [email or web address]  [place], [place] [address] Phone: [phone] Fax: [phone]    Pre-screening note:  Our staff contacted Ms. Habel and offered her an "in person", "face-to-face" appointment versus a telephone encounter. She indicated preferring the telephone encounter, at this time.   Reason for Virtual Visit: COVID-19*  Social distancing based on CDC and AMA recommendations.   I contacted [name] on 04/30/2019 via telephone.      I clearly identified myself as Gaspar Cola, MD. I verified that I was speaking with the correct person using two identifiers (Name: Kara [name], and date of birth: 07-08-1950).  Advanced Informed Consent I sought verbal advanced consent from [name] for virtual visit interactions. I informed Ms. Granberry of possible security and privacy concerns, risks, and limitations associated with providing "not-in-person" medical evaluation and management services. I also informed Ms. Horsley of the availability of "in-person" appointments. Finally, I informed her that there would be a charge for the virtual visit and that she could be  personally, fully or partially, financially responsible for it. Ms. Atayde expressed understanding and agreed to proceed.   Historic Elements   Kara [name] is a 68 y.o. year old, female patient evaluated today after her last encounter by our practice on 04/16/2019. Ms. 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 She also  has a past surgical history that includes Knee surgery; Carpal tunnel release (Bilateral); Colon resection sigmoid (N/A, 11/02/2016); Colostomy (N/A, 11/02/2016); Sigmoidoscopy (N/A, 11/13/2016); PULMONARY VENOGRAPHY (N/A, 11/19/2016); Colonoscopy with propofol (N/A, 02/03/2017); Colon surgery (11/02/2016); IVC FILTER INSERTION (Right, 10/2016); Colostomy reversal (N/A, 02/24/2017); Appendectomy (N/A, 02/24/2017); Lysis of adhesion (N/A, 02/24/2017); laparoscopy (N/A, 02/24/2017); Ileo loop diversion (N/A, 02/24/2017); Sigmoidoscopy (N/A, 05/19/2017); Ileostomy closure (N/A, 06/01/2017); IVC FILTER REMOVAL (N/A, 08/09/2017); Sacroplasty (N/A, 11/08/2017); Cataract extraction w/PHACO (Left, 11/21/2018); and Cataract extraction w/PHACO (Right, 12/12/2018). Ms. Oliveira has a current medication list which includes the following prescription(s): aspirin ec, calcium polycarbophil, candesartan, cyclobenzaprine, dulaglutide, gabapentin, glipizide, meclizine, ropinirole, warfarin, warfarin, hydrocodone-acetaminophen, hydrocodone-acetaminophen, and multi-vitamins. She  reports that she has never smoked. She has never used smokeless tobacco. She reports that she does not drink alcohol or use drugs. Ms. Easterday is allergic to adhesive [tape]; other; latex; and morphine and related.   HPI  Today, she is being contacted for both, medication management and a post-procedure assessment.  The patient indicates doing well with the current medication regimen. No adverse reactions or side effects reported to the medications.  She indicates that the benefit from the injection is beginning to wear off.  I again reminded her that she needs to work on losing weight but she has also been doing some work at home bringing some things down from the attic, or at  least helping her husband do that.  I have reminded her that she needs to put some space between injections so that she does not end up with side effects secondary to the frequent use of the steroids.  I reminded her that at the very least, she  should try to keep those injections approximately 2 months apart.  She understood and accepted.  She also remembers that she needs to stop the Coumadin, at least 5 days prior to the procedure.  She does have a as needed order for the bilateral lumbar facet blocks should the pain get unbearable.  I encouraged her to use more the pain medication so as to help keep some space between procedures.  Post-Procedure Evaluation  Procedure: Palliative bilateral lumbar facet block under fluoroscopic guidance, no sedation Pre-procedure pain level:  10/10 Post-procedure: 0/10 (100% relief)  Sedation: None.  Pain relief after procedure (treated area only): (Questions asked to patient) 1. Starting about 15 minutes after the procedure, and "while the area was still numb" (from the local anesthetics), were you having any of your usual pain "in that area" (the treated area)?  (NOTE: NOT including the discomfort from the needle sticks.) First 1 hour: 99% better. 2. Assuming that it did get numb. How long was the area numb? 99 % benefit, longer than 6 hours. How long? 2 weeks. 3. How much better is your pain now, when compared to before the procedure? Current benefit:45 % better. 4. Can you move better now? Improvement in ROM (Range of Motion): Yes. 5. Can you do more now? Improvement in function: Yes. 4. Did you have any problems with the procedure? Side-effects/Complications: Yes.Current benefits: Defined as benefit that persist at this time.   Analgesia:  <50% better Function: Ms. Mcglade reports improvement in function ROM: Ms. Impastato reports improvement in ROM  Pharmacotherapy Assessment  Analgesic: Hydrocodone/APAP 5/325, 1 tab PO QD PRN (5  mg/day of hydrocodone) (#60 was lasting her 4-5 mo. Last filled on 02/08/2019) as of today 02/28/2019, he still has 25 of the 30 pills left.  She indicates not needing a refill at this time. MME: 5 mg/day.   Monitoring: Pharmacotherapy: No side-effects or adverse reactions reported. [place] PMP: PDMP reviewed during this encounter.       Compliance: No problems identified. Effectiveness: Clinically acceptable. Plan: Refer to "POC".  UDS: No results found for: SUMMARY Laboratory Chemistry Profile (12 mo)  Renal: 12/23/2018: BUN 12; Creatinine, Ser 0.81  Lab Results  Component Value Date   GFRAA >60 12/23/2018   GFRNONAA >60 12/23/2018   Hepatic: 12/23/2018: Albumin 3.5 Lab Results  Component Value Date   AST 30 12/23/2018   ALT 36 12/23/2018   Other: No results found for requested labs within last 8760 hours. Note: Above Lab results reviewed.  Imaging  Fluoro (C-Arm) (<60 min) (No Report) Fluoro was used, but no Radiologist interpretation will be provided.  Please refer to "NOTES" tab for provider progress note.   Assessment  The primary encounter diagnosis was Chronic pain syndrome. Diagnoses of Chronic low back pain (Primary Area of Pain) (Bilateral), Chronic lower extremity pain (Secondary Area of Pain) (Bilateral), Lumbar facet syndrome (Bilateral), Chronic low back pain (Primary Area of Pain) (Bilateral) w/ sciatica (Bilateral), and Chronic anticoagulation (COUMADIN) were also pertinent to this visit.  Plan of Care  Problem-specific:  No problem-specific Assessment & Plan notes found for this encounter.  I am having Iniya K. Jackson-Jinkins start on HYDROcodone-acetaminophen. I am also having her maintain her aspirin EC, cyclobenzaprine, Multi-Vitamins, glipiZIDE, candesartan, gabapentin, meclizine, rOPINIRole, warfarin, warfarin, Calcium Polycarbophil (FIBER-CAPS PO), Dulaglutide, and HYDROcodone-acetaminophen.  Pharmacotherapy (Medications Ordered): Meds ordered this encounter   Medications  . HYDROcodone-acetaminophen (NORCO/VICODIN) 5-325 MG tablet    Sig: Take 1 tablet by mouth every 8 (eight) hours as needed  for severe pain. Must last 30 days.    Dispense:  30 tablet    Refill:  0    Chronic Pain: STOP Act (Not applicable) Fill 1 day early if closed on refill date. Do not fill until: 04/30/2019. To last until: 05/30/2019. Avoid benzodiazepines within 8 hours of opioids  . HYDROcodone-acetaminophen (NORCO/VICODIN) 5-325 MG tablet    Sig: Take 1 tablet by mouth every 8 (eight) hours as needed for severe pain. Must last 30 days.    Dispense:  30 tablet    Refill:  0    Chronic Pain: STOP Act (Not applicable) Fill 1 day early if closed on refill date. Do not fill until: 05/30/2019. To last until: 06/29/2019. Avoid benzodiazepines within 8 hours of opioids   Orders:  Orders Placed This Encounter  Procedures  . L-FCT Blk (PRN)    Scheduling timeframe: (PRN procedure) Ms. Olshefski will call when needed. Clinical indication: Axial low back pain. Lumbosacral Spondylosis (M47.897).  Sedation: Usually done with sedation. (May be done without sedation if so desired by patient.) Requirements: NPO x 8 hrs.; Driver; Stop blood thinners. Interval: No sooner than two weeks for diagnostic or therapeutic. No sooner than every other month for palliative.    Standing Status:   Standing    Number of Occurrences:   5    Standing Expiration Date:   04/29/2020    Scheduling Instructions:     Procedure: Lumbar facet block (AKA.: Lumbosacral medial branch nerve block)     Level: L3-4, L4-5, & L5-S1 Facets (L2, L3, L4, L5, & S1 Medial Branch Nerves)     Laterality: Bilateral    Order Specific Question:   Where will this procedure be performed?    Answer:   [name]  . Blood Thinner (Standing Order)    If the patient requires a Lovenox-bridge therapy, make sure arrangements are made to institute it with the assistance of the PCP.    Standing Status:   Standing     Number of Occurrences:   36    Standing Expiration Date:   10/28/2020    Scheduling Instructions:     Always stop the Coumadin (Warfarin) X 5 days prior to procedure or surgery.   Follow-up plan:   Return in about 8 weeks (around 06/27/2019) for (VV), (MM), in addition, PRN Procedure(s): (B) L-FCT BLK, (Blood-thinner Protocol).      Interventional management options:  Considering:   NOTE:COUMADINANTICOAGULATION(Stop:5days  Re-start: 2hrs) NO RFAuntil BMIis <35. Diagnostic L5-S1 LESI Diagnostic bilateral L3TFESI Diagnostic bilateral L4TFESI Diagnostic bilateral L5 TFESI Diagnostic left SI joint block #1  Possible righ SI joint RFA Possible bilateral lumbar facet RFA(NO RFAuntil BMIis <35.) Diagnostic right IA hip joint injection Diagnostic right femoral +ObturatorNB Possible right femoral + obturator nerve RFA   Palliative PRN treatment(s):   Palliative/Therapeutic left L4-5 LESI #2 Palliative left lumbar facet block#5 Palliativeright lumbar facet block(s) #9 Palliative right SI joint block #4(not as effective as facet blocks) Diagnostic left SI joint block #2 (not as effective in controlling the low back pain as the facet injections) Palliative/Therapeutic (Midline) caudalESI#4    Recent Visits Date Type Provider Dept  04/10/19 Procedure visit Milinda Pointer, MD [name]  04/02/19 Telemedicine Milinda Pointer, MD [name]  03/08/19 Procedure visit Milinda Pointer, MD [name]  02/28/19 Office Visit Milinda Pointer, MD [name]  02/08/19 Procedure visit Milinda Pointer, MD [name]  02/05/19 Office Visit Milinda Pointer, MD [name]  Showing recent visits within past 90 days and meeting all other requirements   Today's Visits Date Type Provider Dept  04/30/19 Telemedicine Milinda Pointer, MD [name]  Showing today's visits and  meeting all other requirements   Future Appointments No visits were found meeting these conditions.  Showing future appointments within next 90 days and meeting all other requirements   I discussed the assessment and treatment plan with the patient. The patient was provided an opportunity to ask questions and all were answered. The patient agreed with the plan and demonstrated an understanding of the instructions.  Patient advised to call back or seek an in-person evaluation if the symptoms or condition worsens.  Total duration of non-face-to-face encounter: 15 minutes.  Note by: Gaspar Cola, MD Date: 04/30/2019; Time: 9:01 AM  Note: This dictation was prepared with Dragon dictation. Any transcriptional errors that may result from this process are unintentional.  Disclaimer:  * Given the special circumstances of the COVID-19 pandemic, the federal government has announced that the Office for Civil Rights (OCR) will exercise its enforcement discretion and will not impose penalties on physicians using telehealth in the event of noncompliance with regulatory requirements under the [place] and [place] (HIPAA) in connection with the good faith provision of telehealth during the CNOBS-96 national public health emergency. ([place])

## 2019-04-30 ENCOUNTER — Other Ambulatory Visit: Payer: Self-pay

## 2019-04-30 ENCOUNTER — Ambulatory Visit: Payer: PPO | Attending: Pain Medicine | Admitting: Pain Medicine

## 2019-04-30 DIAGNOSIS — M5442 Lumbago with sciatica, left side: Secondary | ICD-10-CM | POA: Diagnosis not present

## 2019-04-30 DIAGNOSIS — G8929 Other chronic pain: Secondary | ICD-10-CM

## 2019-04-30 DIAGNOSIS — M47816 Spondylosis without myelopathy or radiculopathy, lumbar region: Secondary | ICD-10-CM

## 2019-04-30 DIAGNOSIS — M79604 Pain in right leg: Secondary | ICD-10-CM

## 2019-04-30 DIAGNOSIS — M5441 Lumbago with sciatica, right side: Secondary | ICD-10-CM

## 2019-04-30 DIAGNOSIS — M545 Low back pain, unspecified: Secondary | ICD-10-CM

## 2019-04-30 DIAGNOSIS — M79605 Pain in left leg: Secondary | ICD-10-CM | POA: Diagnosis not present

## 2019-04-30 DIAGNOSIS — G894 Chronic pain syndrome: Secondary | ICD-10-CM | POA: Diagnosis not present

## 2019-04-30 DIAGNOSIS — Z7901 Long term (current) use of anticoagulants: Secondary | ICD-10-CM | POA: Diagnosis not present

## 2019-04-30 MED ORDER — HYDROCODONE-ACETAMINOPHEN 5-325 MG PO TABS: 1.0000 | ORAL_TABLET | Freq: Three times a day (TID) | ORAL | 0 refills | Status: DC | PRN

## 2019-05-15 ENCOUNTER — Ambulatory Visit (HOSPITAL_BASED_OUTPATIENT_CLINIC_OR_DEPARTMENT_OTHER): Payer: PPO | Admitting: Pain Medicine

## 2019-05-15 ENCOUNTER — Ambulatory Visit
Admission: RE | Admit: 2019-05-15 | Discharge: 2019-05-15 | Disposition: A | Discharge status: Home / Self Care | Payer: PPO | Source: Ambulatory Visit | Attending: Pain Medicine | Admitting: Pain Medicine

## 2019-05-15 ENCOUNTER — Encounter: Payer: Self-pay | Admitting: Pain Medicine

## 2019-05-15 ENCOUNTER — Other Ambulatory Visit: Payer: Self-pay

## 2019-05-15 VITALS — BP 136/90 | HR 91 | Temp 97.7°F | Resp 20 | Ht 63.0 in | Wt 227.0 lb

## 2019-05-15 DIAGNOSIS — M47817 Spondylosis without myelopathy or radiculopathy, lumbosacral region: Secondary | ICD-10-CM | POA: Diagnosis not present

## 2019-05-15 DIAGNOSIS — M47816 Spondylosis without myelopathy or radiculopathy, lumbar region: Secondary | ICD-10-CM | POA: Diagnosis not present

## 2019-05-15 DIAGNOSIS — Z91040 Latex allergy status: Secondary | ICD-10-CM | POA: Insufficient documentation

## 2019-05-15 DIAGNOSIS — Z7901 Long term (current) use of anticoagulants: Secondary | ICD-10-CM | POA: Diagnosis not present

## 2019-05-15 DIAGNOSIS — G8929 Other chronic pain: Secondary | ICD-10-CM | POA: Diagnosis not present

## 2019-05-15 DIAGNOSIS — M4300 Spondylolysis, site unspecified: Secondary | ICD-10-CM | POA: Insufficient documentation

## 2019-05-15 DIAGNOSIS — M545 Low back pain: Secondary | ICD-10-CM | POA: Insufficient documentation

## 2019-05-15 DIAGNOSIS — M5137 Other intervertebral disc degeneration, lumbosacral region: Secondary | ICD-10-CM

## 2019-05-15 DIAGNOSIS — M4310 Spondylolisthesis, site unspecified: Secondary | ICD-10-CM | POA: Diagnosis not present

## 2019-05-15 MED ORDER — ROPIVACAINE HCL 2 MG/ML IJ SOLN
18.0000 mL | Freq: Once | INTRAMUSCULAR | Status: AC
  Administered 2019-05-15: 08:00:00 18 mL via PERINEURAL

## 2019-05-15 MED ORDER — LIDOCAINE HCL 2 % IJ SOLN
INTRAMUSCULAR | Status: AC
  Filled 2019-05-15: qty 20

## 2019-05-15 MED ORDER — ROPIVACAINE HCL 2 MG/ML IJ SOLN
INTRAMUSCULAR | Status: AC
  Filled 2019-05-15: qty 20

## 2019-05-15 MED ORDER — LIDOCAINE HCL 2 % IJ SOLN
20.0000 mL | Freq: Once | INTRAMUSCULAR | Status: AC
  Administered 2019-05-15: 400 mg

## 2019-05-15 MED ORDER — TRIAMCINOLONE ACETONIDE 40 MG/ML IJ SUSP
80.0000 mg | Freq: Once | INTRAMUSCULAR | Status: AC
  Administered 2019-05-15: 08:00:00 80 mg

## 2019-05-15 MED ORDER — TRIAMCINOLONE ACETONIDE 40 MG/ML IJ SUSP
INTRAMUSCULAR | Status: AC
  Filled 2019-05-15: qty 2

## 2019-05-15 NOTE — Progress Notes (Signed)
Safety precautions to be maintained throughout the outpatient stay will include: orient to surroundings, keep bed in low position, maintain call bell within reach at all times, provide assistance with transfer out of bed and ambulation.  

## 2019-05-15 NOTE — Progress Notes (Signed)
PROVIDER NOTE: Information contained herein reflects review and annotations entered in association with encounter. Patient information is provided elsewhere in the medical record. Interpretation of information and data should be left to medically trained personnel. Document created using STT technology, any transcriptional errors that may result from process are unintentional. Patient's Name: Kara [name]  MRN: [id number]  Referring Provider: Tracie Harrier, MD  DOB: [birth year]  PCP: Tracie Harrier, MD  DOS: 05/15/2019  Note by: Gaspar Cola, MD  Service setting: Ambulatory outpatient  Specialty: Interventional Pain Management  Patient type: Established  Location: [name] (AMB) Pain Management Facility  Visit type: Interventional Procedure   Primary Reason for Visit: Interventional Pain Management Treatment. CC: Back Pain (low)  Procedure:          Anesthesia, Analgesia, Anxiolysis:  Type: Lumbar Facet, Medial Branch Block(s)          Primary Purpose: Palliative Region: Posterolateral Lumbosacral Spine Level: L2, L3, L4, L5, & S1 Medial Branch Level(s). Injecting these levels blocks the L3-4, L4-5, and L5-S1 lumbar facet joints. Laterality: Bilateral  Type: Local Anesthesia Indication(s): Analgesia         Route: Infiltration ([place]/IM) IV Access: Declined Sedation: Declined  Local Anesthetic: Lidocaine 1-2%  Position: Prone   Indications: 1. Lumbar facet syndrome (Bilateral)   2. Lumbar facet arthropathy (L3-4, L4-5, and L5-S1) (Bilateral)   3. DDD (degenerative disc disease), lumbosacral   4. Spondylosis without myelopathy or radiculopathy, lumbosacral region   5. 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 Chronic low back pain (Primary Area of Pain) (Bilateral)   7. Chronic anticoagulation (COUMADIN)   8. History of allergy to latex    Pain Score: Pre-procedure: 10-Worst pain ever/10 Post-procedure: 0-No pain/10   Pre-op Assessment:  Ms.  [name] is a 68 y.o. (year old), female patient, seen today for interventional treatment. She  has a past surgical history that includes Knee surgery; Carpal tunnel release (Bilateral); Colon resection sigmoid (N/A, 11/02/2016); Colostomy (N/A, 11/02/2016); Sigmoidoscopy (N/A, 11/13/2016); PULMONARY VENOGRAPHY (N/A, 11/19/2016); Colonoscopy with propofol (N/A, 02/03/2017); Colon surgery (11/02/2016); IVC FILTER INSERTION (Right, 10/2016); Colostomy reversal (N/A, 02/24/2017); Appendectomy (N/A, 02/24/2017); Lysis of adhesion (N/A, 02/24/2017); laparoscopy (N/A, 02/24/2017); [place] loop diversion (N/A, 02/24/2017); Sigmoidoscopy (N/A, 05/19/2017); Ileostomy closure (N/A, 06/01/2017); IVC FILTER REMOVAL (N/A, 08/09/2017); Sacroplasty (N/A, 11/08/2017); Cataract extraction w/PHACO (Left, 11/21/2018); and Cataract extraction w/PHACO (Right, 12/12/2018). Kara [name] has a current medication list which includes the following prescription(s): aspirin ec, calcium polycarbophil, candesartan, cyclobenzaprine, dulaglutide, gabapentin, glipizide, hydrocodone-acetaminophen, [START ON 05/30/2019] hydrocodone-acetaminophen, meclizine, multi-vitamins, ropinirole, warfarin, and warfarin. Her primarily concern today is the Back Pain (low)  Initial Vital Signs:  Pulse/HCG Rate: 91ECG Heart Rate: 86 Temp: 97.7 F (36.5 C) Resp: 18 BP: (!) 153/82 SpO2: 96 %  BMI: Estimated body mass index is 40.21 kg/m as calculated from the following:   Height as of this encounter: 5\' 3"  (1.6 m).   Weight as of this encounter: 227 lb (103 kg).  Risk Assessment: Allergies: Reviewed. She is allergic to adhesive [tape]; other; latex; and morphine and related.  Allergy Precautions: None required Coagulopathies: Reviewed. None identified.  Blood-thinner therapy: None at this time Active Infection(s): Reviewed. None identified. Kara [name] is afebrile  Site Confirmation: Kara [name] was asked to confirm the procedure and  laterality before marking the site Procedure checklist: Completed Consent: Before the procedure and under the influence of no sedative(s), amnesic(s), or anxiolytics, the patient was informed of the treatment options, risks and possible complications. To  fulfill our ethical and legal obligations, as recommended by the American Medical Association's Code of Ethics, I have informed the patient of my clinical impression; the nature and purpose of the treatment or procedure; the risks, benefits, and possible complications of the intervention; the alternatives, including doing nothing; the risk(s) and benefit(s) of the alternative treatment(s) or procedure(s); and the risk(s) and benefit(s) of doing nothing. The patient was provided information about the general risks and possible complications associated with the procedure. These may include, but are not limited to: failure to achieve desired goals, infection, bleeding, organ or nerve damage, allergic reactions, paralysis, and death. In addition, the patient was informed of those risks and complications associated to Spine-related procedures, such as failure to decrease pain; infection (i.e.: Meningitis, epidural or intraspinal abscess); bleeding (i.e.: epidural hematoma, subarachnoid hemorrhage, or any other type of intraspinal or peri-dural bleeding); organ or nerve damage (i.e.: Any type of peripheral nerve, nerve root, or spinal cord injury) with subsequent damage to sensory, motor, and/or autonomic systems, resulting in permanent pain, numbness, and/or weakness of one or several areas of the body; allergic reactions; (i.e.: anaphylactic reaction); and/or death. Furthermore, the patient was informed of those risks and complications associated with the medications. These include, but are not limited to: allergic reactions (i.e.: anaphylactic or anaphylactoid reaction(s)); adrenal axis suppression; blood sugar elevation that in diabetics may result in  ketoacidosis or comma; water retention that in patients with history of congestive heart failure may result in shortness of breath, pulmonary edema, and decompensation with resultant heart failure; weight gain; swelling or edema; medication-induced neural toxicity; particulate matter embolism and blood vessel occlusion with resultant organ, and/or nervous system infarction; and/or aseptic necrosis of one or more joints. Finally, the patient was informed that Medicine is not an exact science; therefore, there is also the possibility of unforeseen or unpredictable risks and/or possible complications that may result in a catastrophic outcome. The patient indicated having understood very clearly. We have given the patient no guarantees and we have made no promises. Enough time was given to the patient to ask questions, all of which were answered to the patient's satisfaction. Ms. Pancoast has indicated that she wanted to continue with the procedure. Attestation: I, the ordering provider, attest that I have discussed with the patient the benefits, risks, side-effects, alternatives, likelihood of achieving goals, and potential problems during recovery for the procedure that I have provided informed consent. Date   Time: 05/15/2019  7:59 AM  Pre-Procedure Preparation:  Monitoring: As per clinic protocol. Respiration, ETCO2, SpO2, BP, heart rate and rhythm monitor placed and checked for adequate function Safety Precautions: Patient was assessed for positional comfort and pressure points before starting the procedure. Time-out: I initiated and conducted the "Time-out" before starting the procedure, as per protocol. The patient was asked to participate by confirming the accuracy of the "Time Out" information. Verification of the correct person, site, and procedure were performed and confirmed by me, the nursing staff, and the patient. "Time-out" conducted as per Joint Commission's Universal Protocol  (UP.01.01.01). Time: 0823  Description of Procedure:          Laterality: Bilateral. The procedure was performed in identical fashion on both sides. Levels:  L2, L3, L4, L5, & S1 Medial Branch Level(s) Area Prepped: Posterior Lumbosacral Region Prepping solution: DuraPrep (Iodine Povacrylex [0.7% available iodine] and Isopropyl Alcohol, 74% w/w) Safety Precautions: Aspiration looking for blood return was conducted prior to all injections. At no point did we inject any substances, as a  needle was being advanced. Before injecting, the patient was told to immediately notify me if she was experiencing any new onset of "ringing in the ears, or metallic taste in the mouth". No attempts were made at seeking any paresthesias. Safe injection practices and needle disposal techniques used. Medications properly checked for expiration dates. SDV (single dose vial) medications used. After the completion of the procedure, all disposable equipment used was discarded in the proper designated medical waste containers. Local Anesthesia: Protocol guidelines were followed. The patient was positioned over the fluoroscopy table. The area was prepped in the usual manner. The time-out was completed. The target area was identified using fluoroscopy. A 12-in long, straight, sterile hemostat was used with fluoroscopic guidance to locate the targets for each level blocked. Once located, the skin was marked with an approved surgical skin marker. Once all sites were marked, the skin (epidermis, dermis, and hypodermis), as well as deeper tissues (fat, connective tissue and muscle) were infiltrated with a small amount of a short-acting local anesthetic, loaded on a 10cc syringe with a 25G, 1.5-in  Needle. An appropriate amount of time was allowed for local anesthetics to take effect before proceeding to the next step. Local Anesthetic: Lidocaine 2.0% The unused portion of the local anesthetic was discarded in the proper designated  containers. Technical explanation of process:  L2 Medial Branch Nerve Block (MBB): The target area for the L2 medial branch is at the junction of the postero-lateral aspect of the superior articular process and the superior, posterior, and medial edge of the transverse process of L3. Under fluoroscopic guidance, a Quincke needle was inserted until contact was made with os over the superior postero-lateral aspect of the pedicular shadow (target area). After negative aspiration for blood, 0.5 mL of the nerve block solution was injected without difficulty or complication. The needle was removed intact. L3 Medial Branch Nerve Block (MBB): The target area for the L3 medial branch is at the junction of the postero-lateral aspect of the superior articular process and the superior, posterior, and medial edge of the transverse process of L4. Under fluoroscopic guidance, a Quincke needle was inserted until contact was made with os over the superior postero-lateral aspect of the pedicular shadow (target area). After negative aspiration for blood, 0.5 mL of the nerve block solution was injected without difficulty or complication. The needle was removed intact. L4 Medial Branch Nerve Block (MBB): The target area for the L4 medial branch is at the junction of the postero-lateral aspect of the superior articular process and the superior, posterior, and medial edge of the transverse process of L5. Under fluoroscopic guidance, a Quincke needle was inserted until contact was made with os over the superior postero-lateral aspect of the pedicular shadow (target area). After negative aspiration for blood, 0.5 mL of the nerve block solution was injected without difficulty or complication. The needle was removed intact. L5 Medial Branch Nerve Block (MBB): The target area for the L5 medial branch is at the junction of the postero-lateral aspect of the superior articular process and the superior, posterior, and medial edge of the  sacral ala. Under fluoroscopic guidance, a Quincke needle was inserted until contact was made with os over the superior postero-lateral aspect of the pedicular shadow (target area). After negative aspiration for blood, 0.5 mL of the nerve block solution was injected without difficulty or complication. The needle was removed intact. S1 Medial Branch Nerve Block (MBB): The target area for the S1 medial branch is at the posterior and inferior  6 o'clock position of the L5-S1 facet joint. Under fluoroscopic guidance, the Quincke needle inserted for the L5 MBB was redirected until contact was made with os over the inferior and postero aspect of the sacrum, at the 6 o' clock position under the L5-S1 facet joint (Target area). After negative aspiration for blood, 0.5 mL of the nerve block solution was injected without difficulty or complication. The needle was removed intact.  Nerve block solution: 0.2% PF-Ropivacaine + Triamcinolone (40 mg/mL) diluted to a final concentration of 4 mg of Triamcinolone/mL of Ropivacaine The unused portion of the solution was discarded in the proper designated containers. Procedural Needles: 22-gauge, 3.5-inch, Quincke needles used for all levels.  Once the entire procedure was completed, the treated area was cleaned, making sure to leave some of the prepping solution back to take advantage of its long term bactericidal properties.   Illustration of the posterior view of the lumbar spine and the posterior neural structures. Laminae of L2 through S1 are labeled. DPRL5, dorsal primary ramus of L5; DPRS1, dorsal primary ramus of S1; DPR3, dorsal primary ramus of L3; FJ, facet (zygapophyseal) joint L3-L4; I, inferior articular process of L4; LB1, lateral branch of dorsal primary ramus of L1; IAB, inferior articular branches from L3 medial branch (supplies L4-L5 facet joint); IBP, intermediate branch plexus; MB3, medial branch of dorsal primary ramus of L3; NR3, third lumbar nerve root;  S, superior articular process of L5; SAB, superior articular branches from L4 (supplies L4-5 facet joint also); TP3, transverse process of L3.  Vitals:   05/15/19 0822 05/15/19 0827 05/15/19 0832 05/15/19 0836  BP: (!) 164/85 (!) 141/90 (!) 144/90 136/90  Pulse:      Resp: 19 18 (!) 22 20  Temp:      SpO2: 95% 95% 94% 96%  Weight:      Height:         Start Time: 0823 hrs. End Time: 0835 hrs.  Imaging Guidance (Spinal):          Type of Imaging Technique: Fluoroscopy Guidance (Spinal) Indication(s): Assistance in needle guidance and placement for procedures requiring needle placement in or near specific anatomical locations not easily accessible without such assistance. Exposure Time: Please see nurses notes. Contrast: None used. Fluoroscopic Guidance: I was personally present during the use of fluoroscopy. "Tunnel Vision Technique" used to obtain the best possible view of the target area. Parallax error corrected before commencing the procedure. "Direction-depth-direction" technique used to introduce the needle under continuous pulsed fluoroscopy. Once target was reached, antero-posterior, oblique, and lateral fluoroscopic projection used confirm needle placement in all planes. Images permanently stored in EMR. Interpretation: No contrast injected. I personally interpreted the imaging intraoperatively. Adequate needle placement confirmed in multiple planes. Permanent images saved into the patient's record.  Antibiotic Prophylaxis:   Anti-infectives (From admission, onward)   None     Indication(s): None identified  Post-operative Assessment:  Post-procedure Vital Signs:  Pulse/HCG Rate: 9185 Temp: 97.7 F (36.5 C) Resp: 20 BP: 136/90 SpO2: 96 %  EBL: None  Complications: No immediate post-treatment complications observed by team, or reported by patient.  Note: The patient tolerated the entire procedure well. A repeat set of vitals were taken after the procedure and the  patient was kept under observation following institutional policy, for this type of procedure. Post-procedural neurological assessment was performed, showing return to baseline, prior to discharge. The patient was provided with post-procedure discharge instructions, including a section on how to identify potential problems. Should any problems arise  concerning this procedure, the patient was given instructions to immediately contact us, at any time, without hesitation. In any case, we plan to contact the patient by telephone for a follow-up status report regarding this interventional procedure.  Comments:  No additional relevant information.  Plan of Care  Orders:  Orders Placed This Encounter  Procedures   L-FCT Blk (PRN)    Scheduling timeframe: (PRN procedure) Ms. Strider will call when needed. Clinical indication: Low back pain w/o sciatica, due to facet syndrome.  Sedation: Usually done with sedation. (May be done without sedation if so desired by patient.) Requirements: NPO x 8 hrs.; Driver; Stop blood thinners. Interval: No sooner than every 2 months for palliative.    Standing Status:   Standing    Number of Occurrences:   5    Standing Expiration Date:   05/14/2020    Scheduling Instructions:     Procedure: Lumbar facet block (AKA.: Lumbosacral medial branch nerve block)     Level: L3-4, L4-5, & L5-S1 Facets (L2, L3, L4, L5, & S1 Medial Branch Nerves)     Laterality: Bilateral    Order Specific Question:   Where will this procedure be performed?    Answer:   [name]   L-FCT Blk (Today)    Scheduling Instructions:     Procedure: Lumbar facet block (AKA.: Lumbosacral medial branch nerve block)     Side: Bilateral     Level: L3-4, L4-5, & L5-S1 Facets (L2, L3, L4, L5, & S1 Medial Branch Nerves)     Sedation: Patient's choice.     Timeframe: Today    Order Specific Question:   Where will this procedure be performed?    Answer:   [name]    Fluoro (C-Arm) (<60 min) (No Report)    Intraoperative interpretation by procedural physician at [place].    Standing Status:   Standing    Number of Occurrences:   1    Order Specific Question:   Reason for exam:    Answer:   Assistance in needle guidance and placement for procedures requiring needle placement in or near specific anatomical locations not easily accessible without such assistance.   Consent: L-FCT BLK    Nursing Order: Transcribe to consent form and obtain patient signature. Note: Always confirm laterality of pain with Ms. Ronning, before procedure. Procedure: Lumbar Facet Block  under fluoroscopic guidance Indication/Reason: Low Back Pain, with our without leg pain, due to Facet Joint Arthralgia (Joint Pain) known as Lumbar Facet Syndrome, secondary to Lumbar, and/or Lumbosacral Spondylosis (Arthritis of the Spine), without myelopathy or radiculopathy (Nerve Damage). Provider Attestation: I, Hulbert Dossie Arbour, MD, (Pain Management Specialist), the physician/practitioner, attest that I have discussed with the patient the benefits, risks, side effects, alternatives, likelihood of achieving goals and potential problems during recovery for the procedure that I have provided informed consent.   Care order/instruction: Please confirm that the patient has stopped the Coumadin (Warfarin) X 5 days prior to procedure or surgery.    Please confirm that the patient has stopped the Coumadin (Warfarin) X 5 days prior to procedure or surgery.    Standing Status:   Standing    Number of Occurrences:   1   Block Tray    Equipment required: Single use, disposable, "Block Tray"    Standing Status:   Standing    Number of Occurrences:   1    Order Specific Question:   Specify    Answer:  Block Tray   Bleeding precautions    Standing Status:   Standing    Number of Occurrences:   1   Allergy: LATEX    Activate Latex-Free Protocol.    Standing Status:    Standing    Number of Occurrences:   1   Chronic Opioid Analgesic:  Hydrocodone/APAP 5/325, 1 tab PO QD PRN (5 mg/day of hydrocodone) (#60 was lasting her 4-5 mo. Last filled on 02/08/2019) as of today 02/28/2019, he still has 25 of the 30 pills left.  She indicates not needing a refill at this time. MME: 5 mg/day.   Medications ordered for procedure: Meds ordered this encounter  Medications   lidocaine (XYLOCAINE) 2 % (with pres) injection 400 mg   ropivacaine (PF) 2 mg/mL (0.2%) (NAROPIN) injection 18 mL   triamcinolone acetonide (KENALOG-40) injection 80 mg   Medications administered: We administered lidocaine, ropivacaine (PF) 2 mg/mL (0.2%), and triamcinolone acetonide.  See the medical record for exact dosing, route, and time of administration.  Follow-up plan:   Return in about 2 weeks (around 05/29/2019) for (VV), (PP) no further injections until after 06/29/2019.       Interventional management options:  Considering:   NOTE:COUMADINANTICOAGULATION(Stop:5days   Re-start: 2hrs) NO RFAuntil BMIis <35. Diagnostic L5-S1 LESI Diagnostic bilateral L3TFESI Diagnostic bilateral L4TFESI Diagnostic bilateral L5 TFESI Diagnostic left SI joint block #1  Possible righ SI joint RFA Possible bilateral lumbar facet RFA(NO RFAuntil BMIis <35.) Diagnostic right IA hip joint injection Diagnostic right femoral +ObturatorNB Possible right femoral + obturator nerve RFA   Palliative PRN treatment(s):   Palliative/Therapeutic left L4-5 LESI #2 Palliative left lumbar facet block#5 Palliativeright lumbar facet block(s) #9 Palliative right SI joint block #4(not as effective as facet blocks) Diagnostic left SI joint block #2 (not as effective in controlling the low back pain as the facet injections) Palliative/Therapeutic (Midline) caudalESI#4     Recent Visits Date Type Provider Dept  04/30/19 Telemedicine Milinda Pointer, MD [name]    04/10/19 Procedure visit Milinda Pointer, MD [name]  04/02/19 Telemedicine Milinda Pointer, MD [name]  03/08/19 Procedure visit Milinda Pointer, MD [name]  02/28/19 Office Visit Milinda Pointer, MD [name]  Showing recent visits within past 90 days and meeting all other requirements   Today's Visits Date Type Provider Dept  05/15/19 Procedure visit Milinda Pointer, MD [name]  Showing today's visits and meeting all other requirements   Future Appointments Date Type Provider Dept  05/28/19 Appointment Milinda Pointer, [facility]  06/27/19 Appointment Milinda Pointer, MD [name]  Showing future appointments within next 90 days and meeting all other requirements   Disposition: Discharge home  Discharge Date & Time: 05/15/2019; 0845 hrs.   Primary Care Physician: Tracie Harrier, MD Location: [facility] Note by: Gaspar Cola, MD Date: 05/15/2019; Time: 9:00 AM  Disclaimer:  Medicine is not an Chief Strategy Officer. The only guarantee in medicine is that nothing is guaranteed. It is important to note that the decision to proceed with this intervention was based on the information collected from the patient. The Data and conclusions were drawn from the patient's questionnaire, the interview, and the physical examination. Because the information was provided in large part by the patient, it cannot be guaranteed that it has not been purposely or unconsciously manipulated. Every effort has been made to obtain as much relevant data as possible for this evaluation. It is important to  note that the conclusions that lead to this procedure are derived in large part from the available data. Always take into account that the treatment will also be dependent on availability of resources and existing treatment guidelines, considered by other Pain  Management Practitioners as being common knowledge and practice, at the time of the intervention. For Medico-Legal purposes, it is also important to point out that variation in procedural techniques and pharmacological choices are the acceptable norm. The indications, contraindications, technique, and results of the above procedure should only be interpreted and judged by a Board-Certified Interventional Pain Specialist with extensive familiarity and expertise in the same exact procedure and technique.

## 2019-05-15 NOTE — Patient Instructions (Signed)

## 2019-05-16 ENCOUNTER — Telehealth: Payer: Self-pay

## 2019-05-16 NOTE — Telephone Encounter (Signed)
Post procedure phone call. Patient states she is doing good.  

## 2019-05-22 NOTE — Progress Notes (Signed)
Pain relief after procedure (treated area only): (Questions asked to patient) 1. Starting about 15 minutes after the procedure, and "while the area was still numb" (from the local anesthetics), were you having any of your usual pain "in that area" (the treated area)?  (NOTE: NOT including the discomfort from the needle sticks.) First 1 hour: 100 % better. First 4-6 hours: 100 % better. 2. How long did the numbness from the local anesthetics last? (More than 6 hours?) Duration: >72 hours.  3. How much better is your pain now, when compared to before the procedure? Current benefit: 100 % better. 4. Can you move better now? Improvement in ROM (Range of Motion): Yes. 5. Can you do more now? Improvement in function: Yes. 4. Did you have any problems with the procedure? Side-effects/Complications: No.

## 2019-05-27 NOTE — Progress Notes (Signed)
Virtual Encounter - Pain Management PROVIDER NOTE: Information contained herein reflects review and annotations entered in association with encounter. Interpretation of such information and data should be left to medically-trained personnel. Information provided to patient can be located elsewhere in the medical record under "Patient Instructions". Document created using STT-dictation technology, any transcriptional errors that may result from process are unintentional.    Contact & Pharmacy Preferred: [phone] Home: [phone] (home) Mobile: [phone] (mobile) E-mail: [email or web address]  [address], [place] [address] Phone: [phone] Fax: [phone]   Pre-screening  Kara [name] offered "in-person" vs "virtual" encounter. She indicated preferring virtual for this encounter.   Reason COVID-19*  Social distancing based on CDC and AMA recommendations.   I contacted Kara [name] on 05/28/2019 via telephone.      I clearly identified myself as Gaspar Cola, MD. I verified that I was speaking with the correct person using two identifiers (Name: Kara [name], and date of birth: 07-04-1950).  Consent I sought verbal advanced consent from Kara [name] for virtual visit interactions. I informed Kara [name] of possible security and privacy concerns, risks, and limitations associated with providing "not-in-person" medical evaluation and management services. I also informed Kara [name] of the availability of "in-person" appointments. Finally, I informed her that there would be a charge for the virtual visit and that she could be  personally, fully or partially, financially responsible for it. Kara [name] expressed understanding and agreed to proceed.   Historic Elements   Kara [name] is a 69 y.o. year old, female patient evaluated today after her last  encounter by our practice on 05/16/2019. 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 She also  has a past surgical history that includes Knee surgery; Carpal tunnel release (Bilateral); Colon resection sigmoid (N/A, 11/02/2016); Colostomy (N/A, 11/02/2016); Sigmoidoscopy (N/A, 11/13/2016); PULMONARY VENOGRAPHY (N/A, 11/19/2016); Colonoscopy with propofol (N/A, 02/03/2017); Colon surgery (11/02/2016); IVC FILTER INSERTION (Right, 10/2016); Colostomy reversal (N/A, 02/24/2017); Appendectomy (N/A, 02/24/2017); Lysis of adhesion (N/A, 02/24/2017); laparoscopy (N/A, 02/24/2017); Ileo loop diversion (N/A, 02/24/2017); Sigmoidoscopy (N/A, 05/19/2017); Ileostomy closure (N/A, 06/01/2017); IVC FILTER REMOVAL (N/A, 08/09/2017); Sacroplasty (N/A, 11/08/2017); Cataract extraction w/PHACO (Left, 11/21/2018); and Cataract extraction w/PHACO (Right, 12/12/2018). Kara [name] has a current medication list which includes the following prescription(s): aspirin ec, calcium polycarbophil, candesartan, cyclobenzaprine, dulaglutide, gabapentin, glipizide, [START ON 05/30/2019] hydrocodone-acetaminophen, [START ON 06/29/2019] hydrocodone-acetaminophen, [START ON 07/29/2019] hydrocodone-acetaminophen, meclizine, multi-vitamins, ropinirole, warfarin, and warfarin. She  reports that she has never smoked. She has never used smokeless tobacco. She reports that she does not drink alcohol or use drugs. Kara [name] is allergic to adhesive [tape]; other; latex; and morphine and related.   HPI  Today, she is being contacted for a post-procedure assessment.  The patient indicates that she is doing much better after the bilateral lumbar facet block.  However, she does  admit that yesterday she was taking down all the Christmas decorations and she is having a little bit of discomfort in the lower back.  I took the opportunity to talk to her about preemptive analgesia, but given her the warning that in view of the fact that she is taking the Coumadin, she should not take any nonsteroidal anti-inflammatory drugs for any prolonged period of  time due to the possibility of gastric irritation leading to bleeding.  I told her to talk to her primary care physician Dr. Ginette Pitman before she attempts to use that technique.  She understood and agreed.  We will continue to follow-up with her as needed.  Post-Procedure Evaluation  Procedure: Palliative bilateral lumbar facet block #L5R9 under fluoroscopic guidance, no sedation Pre-procedure pain level:  10/10 Post-procedure: 0/10 (100% relief)  Sedation: None.  Pain relief after procedure (treated area only): (Questions asked to patient) 1. Starting about 15 minutes after the procedure, and "while the area was still numb" (from the local anesthetics), were you having any of your usual pain "in that area" (the treated area)?  (NOTE: NOT including the discomfort from the needle sticks.) First 1 hour: 100 % better. First 4-6 hours: 100 % better. 2. How long did the numbness from the local anesthetics last? (More than 6 hours?) Duration: >72 hours.  3. How much better is your pain now, when compared to before the procedure? Current benefit: 100 % better. 4. Can you move better now? Improvement in ROM (Range of Motion): Yes. 5. Can you do more now? Improvement in function: Yes. 4. Did you have any problems with the procedure? Side-effects/Complications: No.  Current benefits: Defined as benefit that persist at this time.   Analgesia:  90-100% better Function: Kara [name] reports improvement in function ROM: Kara [name] reports improvement in ROM  Pharmacotherapy Assessment  Analgesic: Hydrocodone/APAP  5/325, 1 tab PO QD PRN (5 mg/day of hydrocodone) (#60 was lasting her 4-5 mo. Last filled on 02/08/2019) as of today 02/28/2019, he still has 25 of the 30 pills left.  She indicates not needing a refill at this time. MME: 5 mg/day.   Monitoring: Pharmacotherapy: No side-effects or adverse reactions reported. [place] PMP: PDMP reviewed during this encounter.       Compliance: No problems identified. Effectiveness: Clinically acceptable. Plan: Refer to "POC".  UDS: No results found for: SUMMARY Laboratory Chemistry Profile (12 mo)  Renal: 12/23/2018: BUN 12; Creatinine, Ser 0.81  Lab Results  Component Value Date   GFRAA >60 12/23/2018   GFRNONAA >60 12/23/2018   Hepatic: 12/23/2018: Albumin 3.5 Lab Results  Component Value Date   AST 30 12/23/2018   ALT 36 12/23/2018   Other: No results found for requested labs within last 8760 hours. Note: Above Lab results reviewed.  Imaging  Fluoro (C-Arm) (<60 min) (No Report) Fluoro was used, but no Radiologist interpretation will be provided.  Please refer to "NOTES" tab for provider progress note.   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  Plan of Care  Problem-specific:  No problem-specific Assessment & Plan notes found for this encounter.  I am having Kara [name] start on HYDROcodone-acetaminophen. I am also having her maintain her aspirin EC, cyclobenzaprine, Multi-Vitamins, glipiZIDE,  candesartan, gabapentin, meclizine, rOPINIRole, warfarin, warfarin, Calcium Polycarbophil (FIBER-CAPS PO), Dulaglutide, HYDROcodone-acetaminophen, and HYDROcodone-acetaminophen.  Pharmacotherapy (Medications Ordered): Meds ordered this encounter  Medications  . HYDROcodone-acetaminophen (NORCO/VICODIN) 5-325 MG tablet    Sig: Take 1 tablet by mouth every  8 (eight) hours as needed for severe pain. Must last 30 days.    Dispense:  30 tablet    Refill:  0    Chronic Pain: STOP Act (Not applicable) Fill 1 day early if closed on refill date. Do not fill until: 06/29/2019. To last until: 07/29/2019. Avoid benzodiazepines within 8 hours of opioids  . HYDROcodone-acetaminophen (NORCO/VICODIN) 5-325 MG tablet    Sig: Take 1 tablet by mouth every 8 (eight) hours as needed for severe pain. Must last 30 days.    Dispense:  30 tablet    Refill:  0    Chronic Pain: STOP Act (Not applicable) Fill 1 day early if closed on refill date. Do not fill until: 07/29/2019. To last until: 08/28/2019. Avoid benzodiazepines within 8 hours of opioids   Orders:  No orders of the defined types were placed in this encounter.  Follow-up plan:   Return in about 13 weeks (around 08/27/2019) for (VV), (MM).      Interventional management options:  Considering:   NOTE:COUMADINANTICOAGULATION(Stop:5days  Re-start: 2hrs) NO RFAuntil BMIis <35. Diagnostic L5-S1 LESI Diagnostic bilateral L3TFESI Diagnostic bilateral L4TFESI Diagnostic bilateral L5 TFESI Diagnostic left SI joint block #1  Possible righ SI joint RFA Possible bilateral lumbar facet RFA(NO RFAuntil BMIis <35.) Diagnostic right IA hip joint injection Diagnostic right femoral +ObturatorNB Possible right femoral + obturator nerve RFA   Palliative PRN treatment(s):   Palliative/Therapeutic left L4-5 LESI #2 Palliative left lumbar facet block#6 Palliativeright lumbar facet block(s) #10 Palliative right SI joint block #4(not as effective  as facet blocks) Diagnostic left SI joint block #2 (not as effective in controlling the low back pain as the facet injections) Palliative/Therapeutic (Midline) caudalESI#4    Recent Visits Date Type Provider Dept  05/15/19 Procedure visit Milinda Pointer, MD [name]  04/30/19 Telemedicine Milinda Pointer, [facility]  04/10/19 Procedure visit Milinda Pointer, MD [name]  04/02/19 Telemedicine Milinda Pointer, MD [name]  03/08/19 Procedure visit Milinda Pointer, MD [name]  02/28/19 Office Visit Milinda Pointer, MD [name]  Showing recent visits within past 90 days and meeting all other requirements   Today's Visits Date Type Provider Dept  05/28/19 Telemedicine Milinda Pointer, MD [name]  Showing today's visits and meeting all other requirements   Future Appointments Date Type Provider Dept  06/27/19 Appointment Milinda Pointer, MD [name]  Showing future appointments within next 90 days and meeting all other requirements   I discussed the assessment and treatment plan with the patient. The patient was provided an opportunity to ask questions and all were answered. The patient agreed with the plan and demonstrated an understanding of the instructions.  Patient advised to call back or seek an in-person evaluation if the symptoms or condition worsens.  Total duration of non-face-to-face encounter: 13 minutes.  Note by: Gaspar Cola, MD Date: 05/28/2019; Time: 9:35 AM

## 2019-05-28 ENCOUNTER — Other Ambulatory Visit: Payer: Self-pay

## 2019-05-28 ENCOUNTER — Ambulatory Visit: Payer: PPO | Attending: Pain Medicine | Admitting: Pain Medicine

## 2019-05-28 DIAGNOSIS — G8929 Other chronic pain: Secondary | ICD-10-CM

## 2019-05-28 DIAGNOSIS — M4807 Spinal stenosis, lumbosacral region: Secondary | ICD-10-CM

## 2019-05-28 DIAGNOSIS — M5442 Lumbago with sciatica, left side: Secondary | ICD-10-CM | POA: Diagnosis not present

## 2019-05-28 DIAGNOSIS — M79604 Pain in right leg: Secondary | ICD-10-CM

## 2019-05-28 DIAGNOSIS — M5441 Lumbago with sciatica, right side: Secondary | ICD-10-CM | POA: Diagnosis not present

## 2019-05-28 DIAGNOSIS — M4300 Spondylolysis, site unspecified: Secondary | ICD-10-CM | POA: Diagnosis not present

## 2019-05-28 DIAGNOSIS — M4306 Spondylolysis, lumbar region: Secondary | ICD-10-CM | POA: Diagnosis not present

## 2019-05-28 DIAGNOSIS — G894 Chronic pain syndrome: Secondary | ICD-10-CM

## 2019-05-28 DIAGNOSIS — M4310 Spondylolisthesis, site unspecified: Secondary | ICD-10-CM | POA: Diagnosis not present

## 2019-05-28 DIAGNOSIS — M79605 Pain in left leg: Secondary | ICD-10-CM

## 2019-05-28 DIAGNOSIS — M47816 Spondylosis without myelopathy or radiculopathy, lumbar region: Secondary | ICD-10-CM

## 2019-05-28 DIAGNOSIS — M48061 Spinal stenosis, lumbar region without neurogenic claudication: Secondary | ICD-10-CM | POA: Diagnosis not present

## 2019-05-28 DIAGNOSIS — M8448XS Pathological fracture, other site, sequela: Secondary | ICD-10-CM | POA: Diagnosis not present

## 2019-05-28 MED ORDER — HYDROCODONE-ACETAMINOPHEN 5-325 MG PO TABS: 1.0000 | ORAL_TABLET | Freq: Three times a day (TID) | ORAL | 0 refills | Status: DC | PRN

## 2019-05-28 NOTE — Patient Instructions (Signed)
____________________________________________________________________________________________  Pain Prevention Technique  Definition:   A technique used to minimize the effects of an activity known to cause inflammation or swelling, which in turn leads to an increase in pain.  Purpose: To prevent swelling from occurring. It is based on the fact that it is easier to prevent swelling from happening than it is to get rid of it, once it occurs.  Contraindications: 1. Anyone with allergy or hypersensitivity to the recommended medications. 2. Anyone taking anticoagulants (Blood Thinners) (e.g., Coumadin, Warfarin, Plavix, etc.). 3. Patients in Renal Failure.  Technique: Before you undertake an activity known to cause pain, or a flare-up of your chronic pain, and before you experience any pain, do the following:  1. On a full stomach, take 4 (four) over the counter Ibuprofens 200mg  tablets (Motrin), for a total of 800 mg. 2. In addition, take over the counter Magnesium 400 to 500 mg, before doing the activity.  3. Six (6) hours later, again on a full stomach, repeat the Ibuprofen. 4. That night, take a warm shower and stretch under the running warm water.  This technique may be sufficient to abort the pain and discomfort before it happens. Keep in mind that it takes a lot less medication to prevent swelling than it takes to eliminate it once it occurs.  ____________________________________________________________________________________________    ______________________________________________________________________________________________  Weight Management Required  URGENT: Your weight has been found to be adversely affecting your health.  Dear [name]:  Your current Estimated body mass index is 40.21 kg/m as calculated from the following:   Height as of 05/15/19: 5\' 3"  (1.6 m).   Weight as of 05/15/19: 227 lb (103 kg).  Calculations estimate your ideal body weight to  be: Ideal body weight: 52.4 kg (115 lb 8.3 oz) Adjusted ideal body weight: 72.6 kg (160 lb 1.8 oz)  Please use the table below to identify your weight category and associated incidence of chronic pain, secondary to your weight.  Body Mass Index (BMI) Classification BMI level (kg/m2) Category Associated incidence of chronic pain  <18  Underweight   18.5-24.9 Ideal body weight   25-29.9 Overweight  20%  30-34.9 Obese (Class I)  68%  35-39.9 Severe obesity (Class II)  136%  >40 Extreme obesity (Class III)  254%   In addition: You will be considered "Morbidly Obese", if your BMI is above 30 and you have one or more of the following conditions which are known to be directly associated with obesity: 1.    Type 2 Diabetes (Which in turn can lead to cardiovascular diseases (CVD), stroke, peripheral vascular diseases (PVD), retinopathy, nephropathy, and neuropathy) 2.    Cardiovascular Disease (High Blood Pressure; Congestive Heart Failure; High Cholesterol; Coronary Artery Disease; Angina; or History of Heart Attacks) 3.    Breathing problems (Asthma; obesity-hypoventilation syndrome; obstructive sleep apnea; chronic inflammatory airway disease; reactive airway disease; or shortness of breath) 4.    Chronic kidney disease 5.    Liver disease (nonalcoholic fatty liver disease) 6.    High blood pressure 7.    Acid reflux (gastroesophageal reflux disease; heartburn) 8.    Osteoarthritis (OA) (with any of the following: hip pain; knee pain; and/or low back pain) 9.    Low back pain (Lumbar Facet Syndrome; and/or Degenerative Disc Disease) 10.  Hip pain (Osteoarthritis of hip) (For every 1 lbs of added body weight, there is a 2 lbs increase in pressure inside of each hip articulation. 1:2 mechanical relationship) 11.  Knee pain (Osteoarthritis  of knee) (For every 1 lbs of added body weight, there is a 4 lbs increase in pressure inside of each knee articulation. 1:4 mechanical relationship) (patients  with a BMI>30 kg/m2 were 6.8 times more likely to develop knee OA than normal-weight individuals) 12.  Cancer. Epidemiological studies have shown that obesity is a risk factor for: post-menopausal breast cancer; cancers of the endometrium, colon and kidney cancer; malignant adenomas of the oesophagus. Obese subjects have an approximately 1.5-3.5-fold increased risk of developing these cancers compared with normal-weight subjects, and it has been estimated that between 15 and 45% of these cancers can be attributed to overweight. More recent studies suggest that obesity may also increase the risk of other types of cancer, including pancreatic, hepatic and gallbladder cancer. (Ref: Obesity and cancer. Pischon T, Nthlings U, [employer] H. Proc Nutr Soc. 2008 May;67(2):128-45. doi: 32.2025/K2706237628315176.)  Recommendation: At this point it is urgent that you take a step back and concentrate in loosing weight. Dedicate 100% of your efforts on this task. Nothing else will improve your health more than bringing down your BMI to less than 30. Because most chronic pain patients do have difficulty exercising secondary to their pain, you must rely on proper nutrition and dieting in order to lose the weight. If your BMI is above 40, you should seriously consider bariatric surgery. A realistic goal is to lose 10% of your body weight over a period of 12 months.  If over time you have unsuccessfully try to lose weight, then it is time for you to seek professional help and to enter a medically supervised weight management program.  Pain management considerations:  1.    Pharmacological Problems: Be advised that the use of opioid analgesics (oxycodone; hydrocodone; morphine; methadone; codeine; and all of their derivatives) have been associated with decreased metabolism and weight gain.  For this reason, should we see that you are unable to lose weight while taking these medications, it may become necessary for [place] to taper down  and indefinitely discontinue them.  2.    Technical Problems: The incidence of successful interventional therapies decreases as the patient's BMI increases. It is much more difficult to accomplish a safe and effective interventional therapy on a patient with a BMI above 35. 3.    Radiation Exposure Problems: The x-rays machine, used to accomplish injection therapies, will automatically increase their x-ray output in order to capture an appropriate bone image. This means that radiation exposure increases exponentially with the patient's BMI. (The higher the BMI, the higher the radiation exposure.) Although the level of radiation used at a given time is still safe to the patient, it is not for the physician and/or assisting staff. Unfortunately, radiation exposure is accumulative. Because physicians and the staff have to do procedures and be exposed on a daily basis, this can result in health problems such as cancer and radiation burns. Radiation exposure to the staff is monitored by the radiation batches that they wear. The exposure levels are reported back to the staff on a quarterly basis. Depending on levels of exposure, physicians and staff may be obligated by law to decrease this exposure. This means that they have the right and obligation to refuse providing therapies where they may be overexposed to radiation. For this reason, physicians may decline to offer therapies such as radiofrequency ablation or implants to patients with a BMI above 40. 4.    Current Trends: Be advised that the current trend is to no longer offer certain therapies to patients  with a BMI equal to, or above 35, due to increase perioperative risks, increased technical procedural difficulties, and excessive radiation exposure to healthcare personnel.  ______________________________________________________________________________________________

## 2019-05-30 DIAGNOSIS — R52 Pain, unspecified: Secondary | ICD-10-CM | POA: Diagnosis not present

## 2019-05-30 DIAGNOSIS — Z20822 Contact with and (suspected) exposure to covid-19: Secondary | ICD-10-CM | POA: Diagnosis not present

## 2019-06-05 ENCOUNTER — Encounter: Payer: Self-pay | Admitting: Pain Medicine

## 2019-06-05 ENCOUNTER — Telehealth: Payer: Self-pay

## 2019-06-05 NOTE — Telephone Encounter (Signed)
She returned a call from here, didn't listen to the voicemail so she wasn't sure what it was about. I'm assuming it is to go over meds for appt tomorrow

## 2019-06-05 NOTE — Progress Notes (Signed)
Patient: Kara [name]  Service Category: E/M  Provider: Gaspar Cola, MD  DOB: Sep 10, 1950  DOS: 06/06/2019  Location: Office  MRN: [id number]  Setting: Ambulatory outpatient  Referring Provider: Tracie Harrier, MD  Type: Established Patient  Specialty: Interventional Pain Management  PCP: Tracie Harrier, MD  Location: Remote location  Delivery: TeleHealth     Virtual Encounter - Pain Management PROVIDER NOTE: Information contained herein reflects review and annotations entered in association with encounter. Interpretation of such information and data should be left to medically-trained personnel. Information provided to patient can be located elsewhere in the medical record under "Patient Instructions". Document created using STT-dictation technology, any transcriptional errors that may result from process are unintentional.    Contact & Pharmacy Preferred: [phone] Home: [phone] (home) Mobile: [phone] (mobile) E-mail: [email or web address]  [place], [place]. [place] [address] Phone: [phone] Fax: [phone]   Pre-screening  Kara [name] offered "in-person" vs "virtual" encounter. She indicated preferring virtual for this encounter.   Reason COVID-19*  Social distancing based on CDC and AMA recommendations.   I contacted Kara [name] on 06/06/2019 via telephone.      I clearly identified myself as Gaspar Cola, MD. I verified that I was speaking with the correct person using two identifiers (Name: Kara [name], and date of birth: 1950/09/23).  Consent I sought verbal advanced consent from Kara [name] for virtual visit interactions. I informed Kara [name] of possible security and privacy concerns, risks, and limitations associated with providing "not-in-person" medical evaluation and management services. I also informed Kara [name] of the  availability of "in-person" appointments. Finally, I informed her that there would be a charge for the virtual visit and that she could be  personally, fully or partially, financially responsible for it. Kara [name] expressed understanding and agreed to proceed.   Historic Elements   Kara [name] is a 69 y.o. year old, female patient evaluated today after her last encounter by our practice on 06/05/2019. 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 She also  has a past surgical history that includes Knee surgery; Carpal tunnel release (Bilateral); Colon resection sigmoid (N/A, 11/02/2016); Colostomy (N/A, 11/02/2016); Sigmoidoscopy (N/A, 11/13/2016); PULMONARY VENOGRAPHY (N/A, 11/19/2016); Colonoscopy with propofol (N/A, 02/03/2017); Colon surgery (11/02/2016); IVC FILTER INSERTION (Right, 10/2016); Colostomy reversal (N/A, 02/24/2017); Appendectomy (N/A, 02/24/2017); Lysis of adhesion (N/A, 02/24/2017); laparoscopy (N/A, 02/24/2017); Ileo loop diversion (N/A, 02/24/2017); Sigmoidoscopy (N/A, 05/19/2017); Ileostomy closure (N/A, 06/01/2017); IVC FILTER REMOVAL (N/A, 08/09/2017); Sacroplasty (N/A, 11/08/2017); Cataract extraction w/PHACO (Left, 11/21/2018); and Cataract extraction w/PHACO (Right, 12/12/2018). Kara [name] has a current medication list which includes the following prescription(s): aspirin ec, calcium polycarbophil, candesartan, cyclobenzaprine, dulaglutide, gabapentin, glipizide, hydrocodone-acetaminophen, [START ON 06/29/2019] hydrocodone-acetaminophen, [START ON 07/29/2019] hydrocodone-acetaminophen, meclizine, multi-vitamins, ropinirole, warfarin, and warfarin. She  reports that she has never smoked. She  has never used smokeless tobacco. She reports that she does not drink alcohol or use drugs. Kara [name] is allergic to adhesive [tape]; other; latex; and morphine and related.   HPI  Today, she is being contacted for worsening of previously known (established) problem.  The patient indicates that she is already experiencing more pain in the lower back.  This is something that keeps recurring every 4 weeks.  She wants to have another injection, but the primary problem that she has is that she is overweight and she needs to work on bringing that down to at least a BMI of 35 so that I can go in there and do a radiofrequency ablation and perhaps provide her with longer lasting benefit.  Today we had a very long conversation about what her options were.  She wanted to know whether or not she would be a good candidate for a spinal cord stimulator, but I informed her that those devices are usually much better for the lower extremity pain as opposed to the low back pain that she has.  She also told me that she had being told that she is not a good candidate for surgery but she did not know exactly why.  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  It is very likely that the reason why she was told that she was not a good candidate for surgery is because her primary pain is that of the lower back and not a lower extremity radiculopathy.  She occasionally will get those, but primarily the problem that she is experiencing is with her lumbar facets.  Because surgery is not good for the treatment of lumbar facet syndrome, it is very likely that this is the primary reason why she was told that she would not be a good candidate for that.  A secondary possibility is that she is not a good candidate secondary to her medical problems which include  diabetes, and being morbidly obese.  Because of this, we again went back to her issue of the weight and I told her that she needs to bring her weight down to a BMI of 35 or below, which in her case due to the fact that she is 5 feet 4 inches tall, needs to be below 200 pounds.  Today I also took the opportunity to talk to her about the steroids and why is it that she should not be getting them on a regular basis.  I quoted the problems with the osteopenia, diabetes, and also the issue of the decreased immune system which at this point is rather important because of the COVID-19 pandemic.  I encouraged the patient to have her COVID-19 injection done, but to my surprise she indicated that she would not be having that because she is concerned of what she and they injection.  I took the opportunity to explain to the patient that I recently had my second injection and that I trust the system and that I think that it would probably be beneficial for her and prudent to consider having the injection done for the purpose of minimizing her chances of getting a COVID-19 infection.  I think that when she realized that I have taken the injection, that perhaps she should lease think about it.   Since her last injection was done on 05/15/2019 and she has been getting them rather frequently, I have recommended we start going on a "every other month" basis and that we avoid having another one done before to July 16, 2019.  Pharmacotherapy Assessment  Analgesic: Hydrocodone/APAP 5/325, 1 tab PO QD PRN (5 mg/day of hydrocodone) (#60 was lasting her 4-5 mo. Last filled on 02/08/2019) as of today 02/28/2019, he still has 25 of the 30  pills left.  She indicates not needing a refill at this time. MME: 5 mg/day.   Monitoring: Pharmacotherapy: No side-effects or adverse reactions reported. [place] PMP: PDMP reviewed during this encounter.       Compliance: No problems identified. Effectiveness: Clinically acceptable. Plan: Refer  to "POC".  UDS: No results found for: SUMMARY Laboratory Chemistry Profile (12 mo)  Renal: 12/23/2018: BUN 12; Creatinine, Ser 0.81  Lab Results  Component Value Date   GFRAA >60 12/23/2018   GFRNONAA >60 12/23/2018   Hepatic: 12/23/2018: Albumin 3.5 Lab Results  Component Value Date   AST 30 12/23/2018   ALT 36 12/23/2018   Other: No results found for requested labs within last 8760 hours. Note: Above Lab results reviewed.  Imaging  Fluoro (C-Arm) (<60 min) (No Report) Fluoro was used, but no Radiologist interpretation will be provided.  Please refer to "NOTES" tab for provider progress note.  Assessment  The primary encounter diagnosis was Chronic pain syndrome. Diagnoses of Chronic low back pain (Primary Area of Pain) (Bilateral), Chronic lower extremity pain (Secondary Area of Pain) (Bilateral), and Chronic hip pain (Left) were also pertinent to this visit.  Plan of Care  Problem-specific:  No problem-specific Assessment & Plan notes found for this encounter.  I am having Kara [name] maintain her aspirin EC, cyclobenzaprine, Multi-Vitamins, glipiZIDE, candesartan, gabapentin, meclizine, rOPINIRole, warfarin, warfarin, Calcium Polycarbophil (FIBER-CAPS PO), Dulaglutide, HYDROcodone-acetaminophen, HYDROcodone-acetaminophen, and HYDROcodone-acetaminophen.  Pharmacotherapy (Medications Ordered): No orders of the defined types were placed in this encounter.  Orders:  No orders of the defined types were placed in this encounter.  Follow-up plan:   Return for regular appointment.      Interventional management options:  Considering:   NOTE:COUMADINANTICOAGULATION(Stop:5days  Re-start: 2hrs) NO RFAuntil BMIis <35. Diagnostic L5-S1 LESI Diagnostic bilateral L3TFESI Diagnostic bilateral L4TFESI Diagnostic bilateral L5 TFESI Diagnostic left SI joint block #1  Possible righ SI joint RFA Possible bilateral lumbar facet RFA(NO RFAuntil BMIis  <35.) Diagnostic right IA hip joint injection Diagnostic right femoral +ObturatorNB Possible right femoral + obturator nerve RFA   Palliative PRN treatment(s):   Palliative/Therapeutic left L4-5 LESI #2 Palliative left lumbar facet block#6 Palliativeright lumbar facet block(s) #10 Palliative right SI joint block #4(not as effective as facet blocks) Diagnostic left SI joint block #2 (not as effective in controlling the low back pain as the facet injections) Palliative/Therapeutic (Midline) caudalESI#4    Recent Visits Date Type Provider Dept  05/28/19 Telemedicine Milinda Pointer, MD [name]  05/15/19 Procedure visit Milinda Pointer, [facility]  04/30/19 Telemedicine Milinda Pointer, MD [name]  04/10/19 Procedure visit Milinda Pointer, MD [name]  04/02/19 Telemedicine Milinda Pointer, MD [name]  03/08/19 Procedure visit Milinda Pointer, MD [name]  Showing recent visits within past 90 days and meeting all other requirements   Today's Visits Date Type Provider Dept  06/06/19 Telemedicine Milinda Pointer, MD [name]  Showing today's visits and meeting all other requirements   Future Appointments Date Type Provider Dept  08/27/19 Appointment Milinda Pointer, MD [name]  Showing future appointments within next 90 days and meeting all other requirements   I discussed the assessment and treatment plan with the patient. The patient was provided an opportunity to ask questions and all were answered. The patient agreed with the plan and demonstrated an understanding of the instructions.  Patient advised to call back or seek an in-person evaluation if the symptoms or condition worsens.  Total  duration of non-face-to-face encounter: 35 minutes.  Note by: Gaspar Cola, MD Date: 06/06/2019; Time: 6:20 AM

## 2019-06-06 ENCOUNTER — Ambulatory Visit: Payer: PPO | Attending: Pain Medicine | Admitting: Pain Medicine

## 2019-06-06 ENCOUNTER — Other Ambulatory Visit: Payer: Self-pay

## 2019-06-06 DIAGNOSIS — M79605 Pain in left leg: Secondary | ICD-10-CM | POA: Diagnosis not present

## 2019-06-06 DIAGNOSIS — M25552 Pain in left hip: Secondary | ICD-10-CM | POA: Diagnosis not present

## 2019-06-06 DIAGNOSIS — G894 Chronic pain syndrome: Secondary | ICD-10-CM

## 2019-06-06 DIAGNOSIS — M545 Low back pain, unspecified: Secondary | ICD-10-CM

## 2019-06-06 DIAGNOSIS — G8929 Other chronic pain: Secondary | ICD-10-CM

## 2019-06-06 DIAGNOSIS — M79604 Pain in right leg: Secondary | ICD-10-CM | POA: Diagnosis not present

## 2019-06-13 ENCOUNTER — Ambulatory Visit: Payer: PPO | Admitting: Pain Medicine

## 2019-06-27 ENCOUNTER — Telehealth: Payer: PPO | Admitting: Pain Medicine

## 2019-07-03 ENCOUNTER — Telehealth: Payer: Self-pay | Admitting: Pain Medicine

## 2019-07-03 NOTE — Telephone Encounter (Signed)
Spoke with Dr Dossie Arbour and he has asked to schedule patient for a palliative bilateral lumbar facet with sedation ASAA.

## 2019-07-03 NOTE — Telephone Encounter (Signed)
Patient is c/o lower back pain bilaterally and then radiating down both legs.  She is able to go to work but when she returns home around 1700 the only thing she can do is go to bed for relief.  Patient is wanting to know if we can move appt for 07/17/19 to an earlier date.  I told patient I would send message to Dr Dossie Arbour to see if this is an appropriate thing, to move her to an earlier appt.

## 2019-07-03 NOTE — Telephone Encounter (Signed)
Patient lvmail on 07-02-19 stating that she is in a lot of pain and wants to see if Dr. Dossie Arbour will do her procedure next week instead of waiting until the 23rd. Wants to know if he would make an exception this time. Please discuss with Dr. Dossie Arbour and let patient know his answer

## 2019-07-06 DIAGNOSIS — Z03818 Encounter for observation for suspected exposure to other biological agents ruled out: Secondary | ICD-10-CM | POA: Diagnosis not present

## 2019-07-09 ENCOUNTER — Other Ambulatory Visit: Payer: Self-pay

## 2019-07-09 ENCOUNTER — Emergency Department
Admission: EM | Admit: 2019-07-09 | Discharge: 2019-07-09 | Disposition: A | Discharge status: Home / Self Care | Payer: PPO | Attending: Emergency Medicine | Admitting: Emergency Medicine

## 2019-07-09 DIAGNOSIS — Z91040 Latex allergy status: Secondary | ICD-10-CM | POA: Insufficient documentation

## 2019-07-09 DIAGNOSIS — E119 Type 2 diabetes mellitus without complications: Secondary | ICD-10-CM | POA: Diagnosis not present

## 2019-07-09 DIAGNOSIS — Z20822 Contact with and (suspected) exposure to covid-19: Secondary | ICD-10-CM | POA: Insufficient documentation

## 2019-07-09 DIAGNOSIS — R197 Diarrhea, unspecified: Secondary | ICD-10-CM

## 2019-07-09 DIAGNOSIS — E860 Dehydration: Secondary | ICD-10-CM | POA: Insufficient documentation

## 2019-07-09 DIAGNOSIS — R1084 Generalized abdominal pain: Secondary | ICD-10-CM | POA: Diagnosis present

## 2019-07-09 DIAGNOSIS — Z933 Colostomy status: Secondary | ICD-10-CM | POA: Diagnosis not present

## 2019-07-09 DIAGNOSIS — E1140 Type 2 diabetes mellitus with diabetic neuropathy, unspecified: Secondary | ICD-10-CM | POA: Diagnosis not present

## 2019-07-09 DIAGNOSIS — E1165 Type 2 diabetes mellitus with hyperglycemia: Secondary | ICD-10-CM | POA: Diagnosis not present

## 2019-07-09 DIAGNOSIS — I959 Hypotension, unspecified: Secondary | ICD-10-CM | POA: Diagnosis not present

## 2019-07-09 DIAGNOSIS — R5381 Other malaise: Secondary | ICD-10-CM | POA: Diagnosis not present

## 2019-07-09 DIAGNOSIS — Z79899 Other long term (current) drug therapy: Secondary | ICD-10-CM | POA: Diagnosis not present

## 2019-07-09 DIAGNOSIS — Z7982 Long term (current) use of aspirin: Secondary | ICD-10-CM | POA: Diagnosis not present

## 2019-07-09 DIAGNOSIS — Z794 Long term (current) use of insulin: Secondary | ICD-10-CM | POA: Diagnosis not present

## 2019-07-09 DIAGNOSIS — Z7901 Long term (current) use of anticoagulants: Secondary | ICD-10-CM | POA: Insufficient documentation

## 2019-07-09 LAB — CBC
HCT: 42.9 % (ref 36.0–46.0)
Hemoglobin: 13.7 g/dL (ref 12.0–15.0)
MCH: 27.9 pg (ref 26.0–34.0)
MCHC: 31.9 g/dL (ref 30.0–36.0)
MCV: 87.4 fL (ref 80.0–100.0)
Platelets: 211 10*3/uL (ref 150–400)
RBC: 4.91 MIL/uL (ref 3.87–5.11)
RDW: 15.9 % — ABNORMAL HIGH (ref 11.5–15.5)
WBC: 5 10*3/uL (ref 4.0–10.5)
nRBC: 0 % (ref 0.0–0.2)

## 2019-07-09 LAB — URINALYSIS, COMPLETE (UACMP) WITH MICROSCOPIC
Bilirubin Urine: NEGATIVE
Glucose, UA: 150 mg/dL — AB
Hgb urine dipstick: NEGATIVE
Ketones, ur: NEGATIVE mg/dL
Nitrite: NEGATIVE
Protein, ur: NEGATIVE mg/dL
Specific Gravity, Urine: 1.016 (ref 1.005–1.030)
pH: 5 (ref 5.0–8.0)

## 2019-07-09 LAB — COMPREHENSIVE METABOLIC PANEL
ALT: 43 U/L (ref 0–44)
AST: 38 U/L (ref 15–41)
Albumin: 3.6 g/dL (ref 3.5–5.0)
Alkaline Phosphatase: 51 U/L (ref 38–126)
Anion gap: 7 (ref 5–15)
BUN: 10 mg/dL (ref 8–23)
CO2: 22 mmol/L (ref 22–32)
Calcium: 8.5 mg/dL — ABNORMAL LOW (ref 8.9–10.3)
Chloride: 110 mmol/L (ref 98–111)
Creatinine, Ser: 0.63 mg/dL (ref 0.44–1.00)
GFR calc Af Amer: >60 mL/min (ref >60)
GFR calc non Af Amer: >60 mL/min (ref >60)
Glucose, Bld: 239 mg/dL — ABNORMAL HIGH (ref 70–99)
Potassium: 3.7 mmol/L (ref 3.5–5.1)
Sodium: 139 mmol/L (ref 135–145)
Total Bilirubin: 0.6 mg/dL (ref 0.3–1.2)
Total Protein: 7 g/dL (ref 6.5–8.1)

## 2019-07-09 LAB — LIPASE, BLOOD: Lipase: 23 U/L (ref 11–51)

## 2019-07-09 MED ORDER — SODIUM CHLORIDE 0.9 % IV BOLUS
1000.0000 mL | Freq: Once | INTRAVENOUS | Status: AC
  Administered 2019-07-09: 1000 mL via INTRAVENOUS

## 2019-07-09 MED ORDER — SODIUM CHLORIDE 0.9% FLUSH: 3.0000 mL | Freq: Once | INTRAVENOUS | Status: DC

## 2019-07-09 MED ORDER — LOPERAMIDE HCL 2 MG PO TABS: 4.0000 mg | ORAL_TABLET | Freq: Four times a day (QID) | ORAL | 0 refills | Status: DC | PRN

## 2019-07-09 NOTE — ED Notes (Signed)
Pt given graham crackers, saltines, peanut butter and diet sprite

## 2019-07-09 NOTE — ED Triage Notes (Signed)
Pt comes into the ED via EMS from home with c/o generalized abd pain with nausea and diarrhea since Thursday, CBG 223, gave 599ml NS, #20g Rwrist..

## 2019-07-09 NOTE — ED Provider Notes (Signed)
[facility] Emergency Department Provider Note  ____________________________________________  Time seen: Approximately 2:09 PM  I have reviewed the triage vital signs and the nursing notes.   HISTORY  Chief Complaint Abdominal Pain    HPI Kara [name] is a 69 y.o. female with a history of pulmonary embolism, diverticulitis, diabetes,   who comes the ED complaining of generalized abdominal pain with nausea and diarrhea for the past 4 days.  Bowel movements are yellowish, not black or bloody.  Denies vomiting or current abdominal pain.  No fevers chills or body aches.  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  She had a Covid test rapid antigen performed through [place] [facility] last week which was negative.  However, symptoms have been persistent.  She denies any chest pain or shortness of breath, no cough.    Past Medical History:  Diagnosis Date  . Anginal pain ([place])   . 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 CHF (congestive heart failure) ([place])    followed colon resection, "wouldn't let me get up"  . Diabetes ([place])    type 2  . Diverticulitis   . History of being hospitalized    1 month ago for 3 days, vomiting, and kink in upper intestine  . History of hiatal hernia    ?  Marland Kitchen Hypertension   . Osteoporosis   . PE (pulmonary thromboembolism) ([place])   . 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 Vertigo      Patient Active Problem List   Diagnosis Date Noted  . Chronic sacroiliac joint pain (Left) 02/28/2019  . Preoperative testing 10/23/2018  . Small bowel obstruction due to adhesions ([place]) 08/24/2018  . Coccygodynia 05/09/2018  . Acute deep vein thrombosis (DVT) of distal end of right lower extremity ([place]) 05/08/2018  . DDD (degenerative disc disease), lumbosacral 04/11/2018  . Chronic anticoagulation ([place]) 04/11/2018  . Morbid obesity with BMI of 40.0-44.9,  adult ([place]) 03/15/2018  . Secondary osteoarthritis of multiple sites 03/15/2018  . Sacral insufficiency fracture 03/15/2018  . Chronic low back pain (Primary Area of Pain) (Bilateral) 12/14/2017  . Spondylosis without myelopathy or radiculopathy, lumbosacral region 12/14/2017  . Other specified dorsopathies, sacral and sacrococcygeal region 12/14/2017  . History of allergy to latex 11/09/2017  . Hyponatremia 11/02/2017  . Hypocalcemia 11/02/2017  . 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 11/02/2017  . Lumbar foraminal stenosis (L3-4 and L4-5) (Bilateral) 11/02/2017  . Lumbosacral L5-S1 subarticular lateral recess stenosis (Bilateral) 11/02/2017  . Lumbar facet arthropathy (L3-4, L4-5, and L5-S1) (Bilateral) 11/02/2017  . Lumbar facet syndrome (Bilateral) 11/02/2017  . 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 Lumbar pars defect (L3 and L4) (Bilateral) 11/02/2017  . Diabetic peripheral neuropathy ([place]) 11/02/2017  . Chronic lower extremity pain (Secondary Area of Pain) (Bilateral) 11/02/2017  . Chronic radicular pain of lower extremity 11/02/2017  . Osteoarthritis of hip (Right) 11/02/2017  . Chronic low back pain (Primary Area of Pain) (Bilateral) w/ sciatica (Bilateral) 10/13/2017  . Chronic sacroiliac joint pain (Right) 10/13/2017  . Chronic pain syndrome 10/13/2017  . Long term current use of opiate analgesic 10/13/2017  . Pharmacologic therapy 10/13/2017  . Disorder of skeletal system 10/13/2017  . Problems influencing health status 10/13/2017  . Diverticulosis of colon 06/20/2017  . History of pulmonary embolism 06/20/2017  . S/P IVC filter 06/20/2017  . H/O ileostomy 06/01/2017  .  Positive result for methicillin resistant Staphylococcus aureus (MRSA) screening 05/11/2017  . Ileostomy in place [facility]) 04/08/2017  . History of colostomy reversal 02/24/2017  . Other pulmonary embolism without acute cor pulmonale ([place]) 11/23/2016  . Acute on  chronic systolic (congestive) heart failure ([place]) 11/18/2016  . HCAP (healthcare-associated pneumonia) 11/18/2016  . Encounter for removal of staples   . Edema of both legs 11/15/2016  . Diverticulitis 10/03/2016  . Diverticulitis large intestine 09/29/2016  . Benign essential hypertension 08/26/2016  . DDD (degenerative disc disease), lumbar 07/14/2016  . Acute non-recurrent maxillary sinusitis 05/21/2016  . Chronic hip pain (Left) 02/23/2016  . Cough 07/30/2015  . Nocturnal leg cramps 02/20/2015  . Diabetes mellitus type 2 in obese ([place]) 11/12/2014  . Type 2 diabetes mellitus without complication, without long-term current use of insulin ([place]) 11/12/2014  . BP (high blood pressure) 11/12/2014     Past Surgical History:  Procedure Laterality Date  . APPENDECTOMY N/A 02/24/2017   Procedure: Incidental  APPENDECTOMY;  Surgeon: [name], MD;  Location: [name] ORS;  Service: General;  Laterality: N/A;  . CARPAL TUNNEL RELEASE Bilateral   . CATARACT EXTRACTION W/PHACO Left 11/21/2018   Procedure: CATARACT EXTRACTION PHACO AND INTRAOCULAR LENS PLACEMENT ([place]) LEFT DIABETES;  Surgeon: Birder Robson, MD;  Location: [place];  Service: Ophthalmology;  Laterality: Left;  latex sensitivity Diabetic - oral meds  . CATARACT EXTRACTION W/PHACO Right 12/12/2018   Procedure: CATARACT EXTRACTION PHACO AND INTRAOCULAR LENS PLACEMENT ([place]) RIGHT DIABETES;  Surgeon: Birder Robson, MD;  Location: [place];  Service: Ophthalmology;  Laterality: Right;  Diabetes-oral med Latex sensitiviy  . COLON RESECTION SIGMOID N/A 11/02/2016   Procedure: COLON RESECTION SIGMOID;  Surgeon: [name], MD;  Location: [name] ORS;  Service: General;  Laterality: N/A;  . COLON SURGERY  11/02/2016  . COLONOSCOPY WITH PROPOFOL N/A 02/03/2017   Procedure: COLONOSCOPY WITH PROPOFOL;  Surgeon: Lin Landsman, MD;  Location[facility] ENDOSCOPY;  Service: Gastroenterology;  Laterality: N/A;  .  COLOSTOMY N/A 11/02/2016   Procedure: COLOSTOMY;  Surgeon: [name], MD;  Location: [name] ORS;  Service: General;  Laterality: N/A;  . COLOSTOMY REVERSAL N/A 02/24/2017   Procedure: COLOSTOMY REVERSAL, ostomy takedown, spleenic flexure mobilization, excision rectal stump/distal sigmoid, anastomosis with suture reinforcement;  Surgeon: [name], MD;  Location: [name] OR[facility];  Service: General;  Laterality: N/A;  . ILEO LOOP DIVERSION N/A 02/24/2017   Procedure: ILEO LOOP COLOSTOMY;  Surgeon: [name], MD;  Location: [name] ORS;  Service: General;  Laterality: N/A;  . ILEOSTOMY CLOSURE N/A 06/01/2017   Procedure: LOOP ILEOSTOMY TAKEDOWN;  Surgeon: [name], MD;  Location: [name] OR[facility];  Service: General;  Laterality: N/A;  . IVC FILTER INSERTION Right 10/2016  . IVC FILTER REMOVAL N/A 08/09/2017   Procedure: IVC FILTER REMOVAL;  Surgeon: Katha Cabal, MD;  Location: [place] CV LAB;  Service: Cardiovascular;  Laterality: N/A;  . KNEE SURGERY     torn menicus  . LAPAROSCOPY N/A 02/24/2017   Procedure: LAPAROSCOPY DIAGNOSTIC;  Surgeon: [name], MD;  Location: [name] ORS;  Service: General;  Laterality: N/A;  . LYSIS OF ADHESION N/A 02/24/2017   Procedure: LYSIS OF ADHESION;  Surgeon: [name], MD;  Location: [name] OR[facility];  Service: General;  Laterality: N/A;  . PULMONARY VENOGRAPHY N/A 11/19/2016   Procedure: Pulmonary Venography; IVC filter placement; possible pulmonary thrombectomy;  Surgeon: Katha Cabal, MD;  Location: [place] CV LAB;  Service: Cardiovascular;  Laterality: N/A;  . SACROPLASTY N/A 11/08/2017  Procedure: SACROPLASTY S2;  Surgeon: Hessie Knows, MD;  Location: [name] ORS;  Service: Orthopedics;  Laterality: N/A;  . SIGMOIDOSCOPY N/A 11/13/2016   Procedure: endoscopic  flexible SIGMOIDOSCOPY;  Surgeon: Florene Glen, MD;  Location: [name] OR[facility];  Service: General;  Laterality: N/A;  . SIGMOIDOSCOPY N/A 05/19/2017   Procedure: Beryle Quant;  Surgeon: [name], MD;  Location: [name] ORS;  Service: General;  Laterality: N/A;     Prior to Admission medications   Medication Sig Start Date End Date Taking? Authorizing Provider  aspirin EC 81 MG tablet Take 81 mg by mouth daily. am    [provider]  Calcium Polycarbophil (FIBER-CAPS PO) Take by mouth. am    [provider]  candesartan (ATACAND) 16 MG tablet Take 16 mg by mouth daily. am 08/03/17   [provider]  cyclobenzaprine (FLEXERIL) 10 MG tablet Take 10 mg by mouth at bedtime.  07/05/16   [provider]  Dulaglutide 0.75 MG/0.5ML SOPN Inject 0.75 mg into the skin once a week. 02/26/19   [provider]  gabapentin (NEURONTIN) 100 MG capsule Take 300 mg by mouth 3 (three) times daily. Am,dinner,bedtime    [provider]  glipiZIDE (GLUCOTROL XL) 5 MG 24 hr tablet Take 15 mg by mouth daily with breakfast.  10/27/16   [provider]  HYDROcodone-acetaminophen (NORCO/VICODIN) 5-325 MG tablet Take 1 tablet by mouth every 8 (eight) hours as needed for severe pain. Must last 30 days. 05/30/19 06/29/19  Milinda Pointer, MD  HYDROcodone-acetaminophen (NORCO/VICODIN) 5-325 MG tablet Take 1 tablet by mouth every 8 (eight) hours as needed for severe pain. Must last 30 days. 06/29/19 07/29/19  Milinda Pointer, MD  HYDROcodone-acetaminophen (NORCO/VICODIN) 5-325 MG tablet Take 1 tablet by mouth every 8 (eight) hours as needed for severe pain. Must last 30 days. 07/29/19 08/28/19  Milinda Pointer, MD  loperamide (IMODIUM A-D) 2 MG tablet Take 2 tablets (4 mg total) by mouth 4 (four) times daily as needed for diarrhea or loose stools. 07/09/19   Carrie Mew, MD  meclizine (ANTIVERT) 25 MG tablet Take 25 mg by mouth daily. am 12/29/17   [provider]  Multiple Vitamin (MULTI-VITAMINS) TABS Take 1 tablet by mouth daily.    [provider]  rOPINIRole (REQUIP) 0.5 MG tablet Take 0.5 mg by mouth at  bedtime.    [provider]  warfarin (COUMADIN) 1 MG tablet Take 1 mg by mouth daily. am 10/17/18   [provider]  warfarin (COUMADIN) 6 MG tablet Take 6 mg by mouth daily.     [provider]     Allergies Adhesive [tape], Other, Latex, and Morphine and related   Family History  Problem Relation Age of Onset  . Diabetes Mother   . Heart disease Mother   . Cancer Mother   . Breast cancer Mother   . Heart disease Father   . Diabetes Sister   . Heart disease Sister   . Breast cancer Cousin     Social History Social History   Tobacco Use  . Smoking status: Never Smoker  . Smokeless tobacco: Never Used  Substance Use Topics  . Alcohol use: No    Alcohol/week: 0.0 standard drinks  . Drug use: No    Review of Systems  Constitutional:   No fever or chills.  ENT:   No sore throat. No rhinorrhea. Cardiovascular:   No chest pain or syncope. Respiratory:   No dyspnea or cough. Gastrointestinal:   Positive  as above for abdominal pain and diarrhea.  No vomiting..  Musculoskeletal:   Negative for focal pain or swelling All other systems reviewed and are negative except as documented above in ROS and HPI.  ____________________________________________   PHYSICAL EXAM:  VITAL SIGNS: ED Triage Vitals  Enc Vitals Group     BP 07/09/19 1008 (!) 146/68     Pulse Rate 07/09/19 1008 91     Resp 07/09/19 1008 18     Temp 07/09/19 1008 98.3 F (36.8 C)     Temp Source 07/09/19 1008 Oral     SpO2 07/09/19 1008 95 %     Weight 07/09/19 0933 231 lb (104.8 kg)     Height 07/09/19 0933 5\' 3"  (1.6 m)     Head Circumference --      Peak Flow --      Pain Score 07/09/19 0933 0     Pain Loc --      Pain Edu? --      Excl. in [place]? --     Vital signs reviewed, nursing assessments reviewed.   Constitutional:   Alert and oriented. Non-toxic appearance. Eyes:   Conjunctivae are normal. EOMI. PERRL. ENT      Head:   Normocephalic and atraumatic.       Nose:   Wearing a mask.      Mouth/Throat:   Wearing a mask.      Neck:   No meningismus. Full ROM. Hematological/Lymphatic/Immunilogical:   No cervical lymphadenopathy. Cardiovascular:   RRR. Symmetric bilateral radial and DP pulses.  No murmurs. Cap refill less than 2 seconds. Respiratory:   Normal respiratory effort without tachypnea/retractions. Breath sounds are clear and equal bilaterally. No wheezes/rales/rhonchi. Gastrointestinal:   Soft and nontender. Non distended. There is no CVA tenderness.  No rebound, rigidity, or guarding.  Musculoskeletal:   Normal range of motion in all extremities. No joint effusions.  No lower extremity tenderness.  No edema. Neurologic:   Normal speech and language.  Motor grossly intact. No acute focal neurologic deficits are appreciated.  Skin:    Skin is warm, dry and intact. No rash noted.  No petechiae, purpura, or bullae.  ____________________________________________    LABS (pertinent positives/negatives) (all labs ordered are listed, but only abnormal results are displayed) Labs Reviewed  COMPREHENSIVE METABOLIC PANEL - Abnormal; Notable for the following components:      Result Value   Glucose, Bld 239 (*)    Calcium 8.5 (*)    All other components within normal limits  CBC - Abnormal; Notable for the following components:   RDW 15.9 (*)    All other components within normal limits  NOVEL CORONAVIRUS, NAA (HOSP ORDER, SEND-OUT TO REF LAB; TAT 18-24 HRS)  LIPASE, BLOOD  URINALYSIS, COMPLETE (UACMP) WITH MICROSCOPIC   ____________________________________________   EKG    ____________________________________________    RADIOLOGY  No results found.  ____________________________________________   PROCEDURES Procedures  ____________________________________________    CLINICAL IMPRESSION / ASSESSMENT AND PLAN / ED COURSE  Medications ordered in the ED: Medications  sodium chloride flush (NS) 0.9 % injection 3 mL (has  no administration in time range)  sodium chloride 0.9 % bolus 1,000 mL (1,000 mLs Intravenous New Bag/Given 07/09/19 1232)    Pertinent labs & imaging results that were available during my care of the patient were reviewed by me and considered in my medical decision making (see chart for details).  Kara [name] was evaluated in Emergency Department on 07/09/2019 for the  symptoms described in the history of present illness. She was evaluated in the context of the global COVID-19 pandemic, which necessitated consideration that the patient might be at risk for infection with the SARS-CoV-2 virus that causes COVID-19. Institutional protocols and algorithms that pertain to the evaluation of patients at risk for COVID-19 are in a state of rapid change based on information released by regulatory bodies including the CDC and federal and state organizations. These policies and algorithms were followed during the patient's care in the ED.   Patient presents with diarrhea, no current abdominal pain.  Tolerating oral intake.  Patient is nontoxic, exam is benign, vital signs are normal, labs are normal.  Suspect viral syndrome, Considering the patient's symptoms, medical history, and physical examination today, I have low suspicion for cholecystitis or biliary pathology, pancreatitis, perforation or bowel obstruction, hernia, intra-abdominal abscess, AAA or dissection, volvulus or intussusception, mesenteric ischemia, or appendicitis.  I will send a Covid PCR today, start loperamide to help with hydration.     ____________________________________________   FINAL CLINICAL IMPRESSION(S) / ED DIAGNOSES    Final diagnoses:  Diarrhea of presumed infectious origin  Dehydration     ED Discharge Orders         Ordered    loperamide (IMODIUM A-D) 2 MG tablet  4 times daily PRN     07/09/19 1409          Portions of this note were generated with dragon dictation software. Dictation errors may  occur despite best attempts at proofreading.   Carrie Mew, MD 07/09/19 [phone]

## 2019-07-09 NOTE — ED Notes (Signed)
Sent green,pruple,and blue top tube to lab.

## 2019-07-09 NOTE — ED Notes (Signed)
Pt reports she just used the bathroom before this RN entered the room and did not collect a sample

## 2019-07-10 ENCOUNTER — Encounter: Payer: Self-pay | Admitting: Pain Medicine

## 2019-07-10 ENCOUNTER — Ambulatory Visit (HOSPITAL_BASED_OUTPATIENT_CLINIC_OR_DEPARTMENT_OTHER): Payer: PPO | Admitting: Pain Medicine

## 2019-07-10 ENCOUNTER — Telehealth: Payer: Self-pay | Admitting: *Deleted

## 2019-07-10 ENCOUNTER — Ambulatory Visit
Admission: RE | Admit: 2019-07-10 | Discharge: 2019-07-10 | Disposition: A | Discharge status: Home / Self Care | Payer: PPO | Source: Ambulatory Visit | Attending: Pain Medicine | Admitting: Pain Medicine

## 2019-07-10 VITALS — BP 151/82 | HR 90 | Temp 97.3°F | Resp 21 | Ht 63.0 in | Wt 224.0 lb

## 2019-07-10 DIAGNOSIS — Z91040 Latex allergy status: Secondary | ICD-10-CM

## 2019-07-10 DIAGNOSIS — Z7901 Long term (current) use of anticoagulants: Secondary | ICD-10-CM | POA: Diagnosis not present

## 2019-07-10 DIAGNOSIS — Z6841 Body Mass Index (BMI) 40.0 and over, adult: Secondary | ICD-10-CM | POA: Insufficient documentation

## 2019-07-10 DIAGNOSIS — M5137 Other intervertebral disc degeneration, lumbosacral region: Secondary | ICD-10-CM | POA: Diagnosis not present

## 2019-07-10 DIAGNOSIS — M47817 Spondylosis without myelopathy or radiculopathy, lumbosacral region: Secondary | ICD-10-CM | POA: Insufficient documentation

## 2019-07-10 DIAGNOSIS — M153 Secondary multiple arthritis: Secondary | ICD-10-CM

## 2019-07-10 DIAGNOSIS — M545 Low back pain, unspecified: Secondary | ICD-10-CM

## 2019-07-10 DIAGNOSIS — M47816 Spondylosis without myelopathy or radiculopathy, lumbar region: Secondary | ICD-10-CM

## 2019-07-10 DIAGNOSIS — G8918 Other acute postprocedural pain: Secondary | ICD-10-CM | POA: Insufficient documentation

## 2019-07-10 DIAGNOSIS — M51379 Other intervertebral disc degeneration, lumbosacral region without mention of lumbar back pain or lower extremity pain: Secondary | ICD-10-CM

## 2019-07-10 DIAGNOSIS — G8929 Other chronic pain: Secondary | ICD-10-CM | POA: Diagnosis not present

## 2019-07-10 LAB — NOVEL CORONAVIRUS, NAA (HOSP ORDER, SEND-OUT TO REF LAB; TAT 18-24 HRS): SARS-CoV-2, NAA: NOT DETECTED

## 2019-07-10 MED ORDER — LIDOCAINE HCL 2 % IJ SOLN
20.0000 mL | Freq: Once | INTRAMUSCULAR | Status: AC
  Administered 2019-07-10: 400 mg

## 2019-07-10 MED ORDER — ROPIVACAINE HCL 2 MG/ML IJ SOLN
INTRAMUSCULAR | Status: AC
  Filled 2019-07-10: qty 20

## 2019-07-10 MED ORDER — TRIAMCINOLONE ACETONIDE 40 MG/ML IJ SUSP
INTRAMUSCULAR | Status: AC
  Filled 2019-07-10: qty 2

## 2019-07-10 MED ORDER — LIDOCAINE HCL 2 % IJ SOLN
INTRAMUSCULAR | Status: AC
  Filled 2019-07-10: qty 20

## 2019-07-10 MED ORDER — KETOROLAC TROMETHAMINE 60 MG/2ML IM SOLN
60.0000 mg | Freq: Once | INTRAMUSCULAR | Status: AC
  Administered 2019-07-10: 60 mg via INTRAMUSCULAR

## 2019-07-10 MED ORDER — ORPHENADRINE CITRATE 30 MG/ML IJ SOLN
60.0000 mg | Freq: Once | INTRAMUSCULAR | Status: AC
  Administered 2019-07-10: 60 mg via INTRAMUSCULAR

## 2019-07-10 MED ORDER — TRIAMCINOLONE ACETONIDE 40 MG/ML IJ SUSP
80.0000 mg | Freq: Once | INTRAMUSCULAR | Status: AC
  Administered 2019-07-10: 09:00:00 80 mg

## 2019-07-10 MED ORDER — ROPIVACAINE HCL 2 MG/ML IJ SOLN
18.0000 mL | Freq: Once | INTRAMUSCULAR | Status: AC
  Administered 2019-07-10: 18 mL via PERINEURAL

## 2019-07-10 MED ORDER — CALCIUM CARBONATE 600 MG PO TABS: 600.0000 mg | ORAL_TABLET | Freq: Two times a day (BID) | ORAL | 5 refills | Status: DC

## 2019-07-10 NOTE — Progress Notes (Signed)
Pt returned to unit with increased pain of 10. Dr Dossie Arbour was notified and ordered Toradol 60mg  and Norflex 60 intramuscular injections. Ice pack was applied to her back. Pt left 15 mins later with a pain score of 2.   When patient was getting out of bed. She stood up and bent over messing with her pants/shoe. Tolerated getting into wheelchair well. Drank h2o and coffee. Dc to home via wheelchair. CGarner,RN

## 2019-07-10 NOTE — Patient Instructions (Signed)

## 2019-07-10 NOTE — Telephone Encounter (Signed)
I spoke with Kara [name] and she stated that she is still at work and hurting. She will go home tonight and take her pain medicine and see how she does.I told her to remember ice today and heat tomorrow and she agreed. She is aware that she can  be seen in the ED if symptoms worsen.

## 2019-07-10 NOTE — Progress Notes (Signed)
PROVIDER NOTE: Information contained herein reflects review and annotations entered in association with encounter. Interpretation of such information and data should be left to medically-trained personnel. Information provided to patient can be located elsewhere in the medical record under "Patient Instructions". Document created using STT-dictation technology, any transcriptional errors that may result from process are unintentional.    Patient: Kara [name]  Service Category: Procedure  Provider: Gaspar Cola, MD  DOB: July 17, 1950  DOS: 07/10/2019  Location: [facility]  MRN: [id number]  Setting: Ambulatory - outpatient  Referring Provider: Tracie Harrier, MD  Type: Established Patient  Specialty: Interventional Pain Management  PCP: Tracie Harrier, MD   Primary Reason for Visit: Interventional Pain Management Treatment. CC: Back Pain (lumbar bilateral )  Procedure:          Anesthesia, Analgesia, Anxiolysis:  Type: Lumbar Facet, Medial Branch Block(s)          Primary Purpose: Diagnostic Region: Posterolateral Lumbosacral Spine Level: L2, L3, L4, L5, & S1 Medial Branch Level(s). Injecting these levels blocks the L3-4, L4-5, and L5-S1 lumbar facet joints. Laterality: Bilateral  Type: Local Anesthesia Indication(s): Analgesia         Route: Infiltration ([place]/IM) IV Access: Declined Sedation: Declined  Local Anesthetic: Lidocaine 1-2%  Position: Prone   Indications: 1. Lumbar facet syndrome (Bilateral)   2. Spondylosis without myelopathy or radiculopathy, lumbosacral region   3. Lumbar facet arthropathy (L3-4, L4-5, and L5-S1) (Bilateral)   4. Chronic low back pain (Primary Area of Pain) (Bilateral)   5. DDD (degenerative disc disease), lumbosacral   6. Osteoarthritis of lumbar spine    Chronic anticoagulation (COUMADIN)    History of allergy to latex    Morbid obesity with BMI of 40.0-44.9, adult (HCC)    Secondary osteoarthritis of multiple  sites    Pain Score: Pre-procedure: 10-Worst pain ever/10 Post-procedure: 5 (when moving around)/10   Pre-op Assessment:  Kara [name] is a 69 y.o. (year old), female patient, seen today for interventional treatment. She  has a past surgical history that includes Knee surgery; Carpal tunnel release (Bilateral); Colon resection sigmoid (N/A, 11/02/2016); Colostomy (N/A, 11/02/2016); Sigmoidoscopy (N/A, 11/13/2016); PULMONARY VENOGRAPHY (N/A, 11/19/2016); Colonoscopy with propofol (N/A, 02/03/2017); Colon surgery (11/02/2016); IVC FILTER INSERTION (Right, 10/2016); Colostomy reversal (N/A, 02/24/2017); Appendectomy (N/A, 02/24/2017); Lysis of adhesion (N/A, 02/24/2017); laparoscopy (N/A, 02/24/2017); [place] loop diversion (N/A, 02/24/2017); Sigmoidoscopy (N/A, 05/19/2017); Ileostomy closure (N/A, 06/01/2017); IVC FILTER REMOVAL (N/A, 08/09/2017); Sacroplasty (N/A, 11/08/2017); Cataract extraction w/PHACO (Left, 11/21/2018); and Cataract extraction w/PHACO (Right, 12/12/2018). Kara [name] has a current medication list which includes the following prescription(s): aspirin ec, calcium polycarbophil, candesartan, cyclobenzaprine, dulaglutide, gabapentin, glipizide, hydrocodone-acetaminophen, [START ON 07/29/2019] hydrocodone-acetaminophen, loperamide, meclizine, multi-vitamins, ropinirole, warfarin, warfarin, calcium carbonate, and hydrocodone-acetaminophen. Her primarily concern today is the Back Pain (lumbar bilateral )  Initial Vital Signs:  Pulse/HCG Rate: 92  Temp: (!) 97.3 F (36.3 C) Resp: 16 BP: (!) 143/83 SpO2: 100 %  BMI: Estimated body mass index is 39.68 kg/m as calculated from the following:   Height as of this encounter: 5\' 3"  (1.6 m).   Weight as of this encounter: 224 lb (101.6 kg).  Risk Assessment: Allergies: Reviewed. She is allergic to adhesive [tape]; other; latex; and morphine and related.  Allergy Precautions: None required Coagulopathies: Reviewed. None identified.    Blood-thinner therapy: None at this time Active Infection(s): Reviewed. None identified. Kara [name] is afebrile  Site Confirmation: Kara [name] was asked to confirm the procedure and laterality before marking the site  Procedure checklist: Completed Consent: Before the procedure and under the influence of no sedative(s), amnesic(s), or anxiolytics, the patient was informed of the treatment options, risks and possible complications. To fulfill our ethical and legal obligations, as recommended by the American Medical Association's Code of Ethics, I have informed the patient of my clinical impression; the nature and purpose of the treatment or procedure; the risks, benefits, and possible complications of the intervention; the alternatives, including doing nothing; the risk(s) and benefit(s) of the alternative treatment(s) or procedure(s); and the risk(s) and benefit(s) of doing nothing. The patient was provided information about the general risks and possible complications associated with the procedure. These may include, but are not limited to: failure to achieve desired goals, infection, bleeding, organ or nerve damage, allergic reactions, paralysis, and death. In addition, the patient was informed of those risks and complications associated to Spine-related procedures, such as failure to decrease pain; infection (i.e.: Meningitis, epidural or intraspinal abscess); bleeding (i.e.: epidural hematoma, subarachnoid hemorrhage, or any other type of intraspinal or peri-dural bleeding); organ or nerve damage (i.e.: Any type of peripheral nerve, nerve root, or spinal cord injury) with subsequent damage to sensory, motor, and/or autonomic systems, resulting in permanent pain, numbness, and/or weakness of one or several areas of the body; allergic reactions; (i.e.: anaphylactic reaction); and/or death. Furthermore, the patient was informed of those risks and complications associated with the  medications. These include, but are not limited to: allergic reactions (i.e.: anaphylactic or anaphylactoid reaction(s)); adrenal axis suppression; blood sugar elevation that in diabetics may result in ketoacidosis or comma; water retention that in patients with history of congestive heart failure may result in shortness of breath, pulmonary edema, and decompensation with resultant heart failure; weight gain; swelling or edema; medication-induced neural toxicity; particulate matter embolism and blood vessel occlusion with resultant organ, and/or nervous system infarction; and/or aseptic necrosis of one or more joints. Finally, the patient was informed that Medicine is not an exact science; therefore, there is also the possibility of unforeseen or unpredictable risks and/or possible complications that may result in a catastrophic outcome. The patient indicated having understood very clearly. We have given the patient no guarantees and we have made no promises. Enough time was given to the patient to ask questions, all of which were answered to the patient's satisfaction. Ms. Burkel has indicated that she wanted to continue with the procedure. Attestation: I, the ordering provider, attest that I have discussed with the patient the benefits, risks, side-effects, alternatives, likelihood of achieving goals, and potential problems during recovery for the procedure that I have provided informed consent. Date  Time: 07/10/2019  8:12 AM  Pre-Procedure Preparation:  Monitoring: As per clinic protocol. Respiration, ETCO2, SpO2, BP, heart rate and rhythm monitor placed and checked for adequate function Safety Precautions: Patient was assessed for positional comfort and pressure points before starting the procedure. Time-out: I initiated and conducted the "Time-out" before starting the procedure, as per protocol. The patient was asked to participate by confirming the accuracy of the "Time Out" information.  Verification of the correct person, site, and procedure were performed and confirmed by me, the nursing staff, and the patient. "Time-out" conducted as per Joint Commission's Universal Protocol (UP.01.01.01). Time: 805 [id number]  Description of Procedure:          Laterality: Bilateral. The procedure was performed in identical fashion on both sides. Levels:  L2, L3, L4, L5, & S1 Medial Branch Level(s) Area Prepped: Posterior Lumbosacral Region Prepping solution: DuraPrep (Iodine Povacrylex [0.7% available  iodine] and Isopropyl Alcohol, 74% w/w) Safety Precautions: Aspiration looking for blood return was conducted prior to all injections. At no point did we inject any substances, as a needle was being advanced. Before injecting, the patient was told to immediately notify me if she was experiencing any new onset of "ringing in the ears, or metallic taste in the mouth". No attempts were made at seeking any paresthesias. Safe injection practices and needle disposal techniques used. Medications properly checked for expiration dates. SDV (single dose vial) medications used. After the completion of the procedure, all disposable equipment used was discarded in the proper designated medical waste containers. Local Anesthesia: Protocol guidelines were followed. The patient was positioned over the fluoroscopy table. The area was prepped in the usual manner. The time-out was completed. The target area was identified using fluoroscopy. A 12-in long, straight, sterile hemostat was used with fluoroscopic guidance to locate the targets for each level blocked. Once located, the skin was marked with an approved surgical skin marker. Once all sites were marked, the skin (epidermis, dermis, and hypodermis), as well as deeper tissues (fat, connective tissue and muscle) were infiltrated with a small amount of a short-acting local anesthetic, loaded on a 10cc syringe with a 25G, 1.5-in  Needle. An appropriate amount of time was allowed  for local anesthetics to take effect before proceeding to the next step. Local Anesthetic: Lidocaine 2.0% The unused portion of the local anesthetic was discarded in the proper designated containers. Technical explanation of process:  L2 Medial Branch Nerve Block (MBB): The target area for the L2 medial branch is at the junction of the postero-lateral aspect of the superior articular process and the superior, posterior, and medial edge of the transverse process of L3. Under fluoroscopic guidance, a Quincke needle was inserted until contact was made with os over the superior postero-lateral aspect of the pedicular shadow (target area). After negative aspiration for blood, 0.5 mL of the nerve block solution was injected without difficulty or complication. The needle was removed intact. L3 Medial Branch Nerve Block (MBB): The target area for the L3 medial branch is at the junction of the postero-lateral aspect of the superior articular process and the superior, posterior, and medial edge of the transverse process of L4. Under fluoroscopic guidance, a Quincke needle was inserted until contact was made with os over the superior postero-lateral aspect of the pedicular shadow (target area). After negative aspiration for blood, 0.5 mL of the nerve block solution was injected without difficulty or complication. The needle was removed intact. L4 Medial Branch Nerve Block (MBB): The target area for the L4 medial branch is at the junction of the postero-lateral aspect of the superior articular process and the superior, posterior, and medial edge of the transverse process of L5. Under fluoroscopic guidance, a Quincke needle was inserted until contact was made with os over the superior postero-lateral aspect of the pedicular shadow (target area). After negative aspiration for blood, 0.5 mL of the nerve block solution was injected without difficulty or complication. The needle was removed intact. L5 Medial Branch Nerve Block  (MBB): The target area for the L5 medial branch is at the junction of the postero-lateral aspect of the superior articular process and the superior, posterior, and medial edge of the sacral ala. Under fluoroscopic guidance, a Quincke needle was inserted until contact was made with os over the superior postero-lateral aspect of the pedicular shadow (target area). After negative aspiration for blood, 0.5 mL of the nerve block solution was injected  without difficulty or complication. The needle was removed intact. S1 Medial Branch Nerve Block (MBB): The target area for the S1 medial branch is at the posterior and inferior 6 o'clock position of the L5-S1 facet joint. Under fluoroscopic guidance, the Quincke needle inserted for the L5 MBB was redirected until contact was made with os over the inferior and postero aspect of the sacrum, at the 6 o' clock position under the L5-S1 facet joint (Target area). After negative aspiration for blood, 0.5 mL of the nerve block solution was injected without difficulty or complication. The needle was removed intact.  Nerve block solution: 0.2% PF-Ropivacaine + Triamcinolone (40 mg/mL) diluted to a final concentration of 4 mg of Triamcinolone/mL of Ropivacaine The unused portion of the solution was discarded in the proper designated containers. Procedural Needles: 22-gauge, 5-inch, Quincke needles used for all levels.  Once the entire procedure was completed, the treated area was cleaned, making sure to leave some of the prepping solution back to take advantage of its long term bactericidal properties.   Illustration of the posterior view of the lumbar spine and the posterior neural structures. Laminae of L2 through S1 are labeled. DPRL5, dorsal primary ramus of L5; DPRS1, dorsal primary ramus of S1; DPR3, dorsal primary ramus of L3; FJ, facet (zygapophyseal) joint L3-L4; I, inferior articular process of L4; LB1, lateral branch of dorsal primary ramus of L1; IAB, inferior  articular branches from L3 medial branch (supplies L4-L5 facet joint); IBP, intermediate branch plexus; MB3, medial branch of dorsal primary ramus of L3; NR3, third lumbar nerve root; S, superior articular process of L5; SAB, superior articular branches from L4 (supplies L4-5 facet joint also); TP3, transverse process of L3.  Vitals:   07/10/19 0830 07/10/19 0840 07/10/19 0850 07/10/19 0900  BP: (!) 144/76 (!) 145/85 (!) 146/81 (!) 151/82  Pulse: 96 93 90 90  Resp: 18 (!) 28 (!) 22 (!) 21  Temp:      TempSrc:      SpO2: 98% 93% 97% 98%  Weight:      Height:         Start Time: 0841 hrs. End Time: 0855 hrs.  Imaging Guidance (Spinal):          Type of Imaging Technique: Fluoroscopy Guidance (Spinal) Indication(s): Assistance in needle guidance and placement for procedures requiring needle placement in or near specific anatomical locations not easily accessible without such assistance. Exposure Time: Please see nurses notes. Contrast: None used. Fluoroscopic Guidance: I was personally present during the use of fluoroscopy. "Tunnel Vision Technique" used to obtain the best possible view of the target area. Parallax error corrected before commencing the procedure. "Direction-depth-direction" technique used to introduce the needle under continuous pulsed fluoroscopy. Once target was reached, antero-posterior, oblique, and lateral fluoroscopic projection used confirm needle placement in all planes. Images permanently stored in EMR. Interpretation: No contrast injected. I personally interpreted the imaging intraoperatively. Adequate needle placement confirmed in multiple planes. Permanent images saved into the patient's record.  Antibiotic Prophylaxis:   Anti-infectives (From admission, onward)   None     Indication(s): None identified  Post-operative Assessment:  Post-procedure Vital Signs:  Pulse/HCG Rate: 90  Temp: (!) 97.3 F (36.3 C) Resp: (!) 21 BP: (!) 151/82 SpO2: 98  %  EBL: None  Complications: No immediate post-treatment complications observed by team, or reported by patient.  According to the nursing staff the patient seemed to have done well after the procedure, however, before she was about to leave she flexed  down to apparently pick something up from the floor or reach to her shoes, when she started having an acute low back pain for which we had to give her an IM injection of Toradol 60 mg and Norflex 60 mg.  I went ahead and also reviewed her labs before doing the injection and although her kidney function seem to be good based on a metabolic panel done yesterday, it did show that she was hypocalcemic and therefore I have sent a prescription to her pharmacy for some calcium.  15 minutes after the IM injection of Toradol/Norflex, the patient was doing much better and she was able to be discharged.  In any case, I did order an x-ray of her lumbar spine on flexion and extension to see how she is currently doing since the last one was done 2 years ago and she seems to be deteriorating.  Unfortunately, she has not lost any weight and she also did not keep the appointment that I got for her to go to a nutritionist.  Note: The patient tolerated the entire procedure well. A repeat set of vitals were taken after the procedure and the patient was kept under observation following institutional policy, for this type of procedure. Post-procedural neurological assessment was performed, showing return to baseline, prior to discharge. The patient was provided with post-procedure discharge instructions, including a section on how to identify potential problems. Should any problems arise concerning this procedure, the patient was given instructions to immediately contact us, at any time, without hesitation. In any case, we plan to contact the patient by telephone for a follow-up status report regarding this interventional procedure.  Comments:  No additional relevant  information.  Plan of Care  Orders:  Orders Placed This Encounter  Procedures  . LUMBAR FACET(MEDIAL BRANCH NERVE BLOCK) MBNB    Scheduling Instructions:     Procedure: Lumbar facet block (AKA.: Lumbosacral medial branch nerve block)     Side: Bilateral     Level: L3-4, L4-5, & L5-S1 Facets (L2, L3, L4, L5, & S1 Medial Branch Nerves)     Sedation: No Sedation.     Timeframe: Today    Order Specific Question:   Where will this procedure be performed?    Answer:   [name]  . DG PAIN CLINIC C-ARM 1-60 MIN NO REPORT    Intraoperative interpretation by procedural physician at [facility].    Standing Status:   Standing    Number of Occurrences:   1    Order Specific Question:   Reason for exam:    Answer:   Assistance in needle guidance and placement for procedures requiring needle placement in or near specific anatomical locations not easily accessible without such assistance.  . DG Lumbar Spine Complete W/Bend    Patient presents with axial pain with possible radicular component.  In addition to any acute findings, please report on:  1. Facet (Zygapophyseal) joint DJD (Hypertrophy, space narrowing, subchondral sclerosis, and/or osteophyte formation) 2. DDD and/or IVDD (Loss of disc height, desiccation or "Black disc disease") 3. Pars defects 4. Spondylolisthesis, spondylosis, and/or spondyloarthropathies (include Degree/Grade of displacement in mm) 5. Vertebral body Fractures, including age (old, new/acute) 85. Modic Type Changes 7. Demineralization 8. Bone pathology 9. Central, Lateral Recess, and/or Foraminal Stenosis (include AP diameter of stenosis in mm) 10. Surgical changes (hardware type, status, and presence of fibrosis)  NOTE: Please specify level(s) and laterality. If applicable: Please indicate ROM and/or evidence of instability (>32mm displacement between flexion  and extension views)    Standing Status:   Future    Standing Expiration Date:    10/07/2019    Order Specific Question:   Reason for Exam (SYMPTOM  OR DIAGNOSIS REQUIRED)    Answer:   Low back pain    Order Specific Question:   Preferred imaging location?    Answer:   [facility]    Order Specific Question:   Call Results- Best Contact Number?    Answer:   [phone] ([facility])    Order Specific Question:   Radiology Contrast Protocol - do NOT remove file path    Answer:   [email or web address]  . Informed Consent Details: Physician/Practitioner Attestation; Transcribe to consent form and obtain patient signature    Nursing Order: Transcribe to consent form and obtain patient signature. Note: Always confirm laterality of pain with Ms. Arteaga, before procedure. Procedure: Lumbar Facet Block  under fluoroscopic guidance Indication/Reason: Low Back Pain, with our without leg pain, due to Facet Joint Arthralgia (Joint Pain) known as Lumbar Facet Syndrome, secondary to Lumbar, and/or Lumbosacral Spondylosis (Arthritis of the Spine), without myelopathy or radiculopathy (Nerve Damage). Provider Attestation: I, [place] Dossie Arbour, MD, (Pain Management Specialist), the physician/practitioner, attest that I have discussed with the patient the benefits, risks, side effects, alternatives, likelihood of achieving goals and potential problems during recovery for the procedure that I have provided informed consent.  . Care order/instruction: Please confirm that the patient has stopped the Coumadin (Warfarin) X 5 days prior to procedure or surgery.    Please confirm that the patient has stopped the Coumadin (Warfarin) X 5 days prior to procedure or surgery.    Standing Status:   Standing    Number of Occurrences:   1  . Provide equipment / supplies at bedside    Equipment required: Single use, disposable, "Block Tray"    Standing Status:   Standing    Number of Occurrences:   1    Order Specific Question:   Specify     Answer:   Block Tray  . Bleeding precautions    Standing Status:   Standing    Number of Occurrences:   1  . Latex precautions    Activate Latex-Free Protocol.    Standing Status:   Standing    Number of Occurrences:   1   Chronic Opioid Analgesic:  Hydrocodone/APAP 5/325, 1 tab PO QD PRN (5 mg/day of hydrocodone) (#60 was lasting her 4-5 mo. Last filled on 02/08/2019) as of today 02/28/2019, he still has 25 of the 30 pills left.  She indicates not needing a refill at this time. MME: 5 mg/day.   Medications ordered for procedure: Meds ordered this encounter  Medications  . lidocaine (XYLOCAINE) 2 % (with pres) injection 400 mg  . ropivacaine (PF) 2 mg/mL (0.2%) (NAROPIN) injection 18 mL  . triamcinolone acetonide (KENALOG-40) injection 80 mg  . ketorolac (TORADOL) injection 60 mg  . orphenadrine (NORFLEX) injection 60 mg  . calcium carbonate (CALCIUM 600) 600 MG TABS tablet    Sig: Take 1 tablet (600 mg total) by mouth 2 (two) times daily with a meal.    Dispense:  60 tablet    Refill:  5    Fill one day early if pharmacy is closed on scheduled refill date. May substitute for generic if available.   Medications administered: We administered lidocaine, ropivacaine (PF) 2 mg/mL (0.2%), triamcinolone acetonide, ketorolac, and orphenadrine.  See the medical record for exact dosing, route, and  time of administration.  Follow-up plan:   Return in about 2 weeks (around 07/24/2019) for (VV), (PP).       Interventional management options:  Considering:   NOTE:COUMADINANTICOAGULATION(Stop:5days  Re-start: 2hrs) NO RFAuntil BMIis <35. Diagnostic L5-S1 LESI Diagnostic bilateral L3TFESI Diagnostic bilateral L4TFESI Diagnostic bilateral L5 TFESI Diagnostic left SI joint block #1  Possible righ SI joint RFA Possible bilateral lumbar facet RFA(NO RFAuntil BMIis <35.) Diagnostic right IA hip joint injection Diagnostic right femoral +ObturatorNB Possible right femoral  + obturator nerve RFA   Palliative PRN treatment(s):   Palliative/Therapeutic left L4-5 LESI #2 Palliative left lumbar facet block#6 Palliativeright lumbar facet block(s) #10 Palliative right SI joint block #4(not as effective as facet blocks) Diagnostic left SI joint block #2 (not as effective in controlling the low back pain as the facet injections) Palliative/Therapeutic (Midline) caudalESI#4    Recent Visits Date Type Provider Dept  06/06/19 Telemedicine Milinda Pointer, MD [name]  05/28/19 Telemedicine Milinda Pointer, MD [name]  05/15/19 Procedure visit Milinda Pointer, MD [name]  04/30/19 Telemedicine Milinda Pointer, MD [name]  Showing recent visits within past 90 days and meeting all other requirements   Today's Visits Date Type Provider Dept  07/10/19 Procedure visit Milinda Pointer, MD [name]  Showing today's visits and meeting all other requirements   Future Appointments Date Type Provider Dept  07/25/19 Appointment Milinda Pointer, MD [name]  08/27/19 Appointment Milinda Pointer, MD [name]  Showing future appointments within next 90 days and meeting all other requirements   Disposition: Discharge home  Discharge (Date  Time): 07/10/2019; 0902 hrs.   Primary Care Physician: Tracie Harrier, MD Location: [facility] Note by: Gaspar Cola, MD Date: 07/10/2019; Time: 10:00 AM  Disclaimer:  Medicine is not an Chief Strategy Officer. The only guarantee in medicine is that nothing is guaranteed. It is important to note that the decision to proceed with this intervention was based on the information collected from the patient. The Data and conclusions were drawn from the patient's questionnaire, the interview, and the physical examination. Because the information was provided in large part by the patient, it  cannot be guaranteed that it has not been purposely or unconsciously manipulated. Every effort has been made to obtain as much relevant data as possible for this evaluation. It is important to note that the conclusions that lead to this procedure are derived in large part from the available data. Always take into account that the treatment will also be dependent on availability of resources and existing treatment guidelines, considered by other Pain Management Practitioners as being common knowledge and practice, at the time of the intervention. For Medico-Legal purposes, it is also important to point out that variation in procedural techniques and pharmacological choices are the acceptable norm. The indications, contraindications, technique, and results of the above procedure should only be interpreted and judged by a Board-Certified Interventional Pain Specialist with extensive familiarity and expertise in the same exact procedure and technique.

## 2019-07-10 NOTE — Telephone Encounter (Signed)
Attempted to call patient, No answer. Left message on AM to call us back and that she could always go to the ER if needed.

## 2019-07-11 ENCOUNTER — Telehealth: Payer: Self-pay

## 2019-07-11 ENCOUNTER — Telehealth: Payer: Self-pay | Admitting: Pain Medicine

## 2019-07-11 NOTE — Telephone Encounter (Signed)
Called patient. She was at work and driving a car when I spoke with her. I instructed her to give the procedure 5-7 days and see if she gets any pain relief. No other complaints given. States she will return call if new symptoms or pain worsens.

## 2019-07-11 NOTE — Telephone Encounter (Signed)
Post procedure phone call.  LM 

## 2019-07-11 NOTE — Telephone Encounter (Signed)
Pt called and stated that she is still in severe pain and wants to know what can be done for her. Please call her back.

## 2019-07-16 ENCOUNTER — Telehealth: Payer: Self-pay | Admitting: Pain Medicine

## 2019-07-16 NOTE — Telephone Encounter (Signed)
Patient called stating she is still in a lot of pain and wants to know what Dr. Dossie Arbour wants to do about this.

## 2019-07-16 NOTE — Telephone Encounter (Signed)
Continues to have much pain after lumbar facet block 1 week ago. Has worsened over the past week. No fever, no other signs/symptoms of complications.

## 2019-07-17 ENCOUNTER — Telehealth: Payer: Self-pay | Admitting: Pain Medicine

## 2019-07-17 ENCOUNTER — Ambulatory Visit: Payer: PPO | Admitting: Pain Medicine

## 2019-07-17 NOTE — Telephone Encounter (Signed)
Wait at least 2 weeks to see effects of steroids.

## 2019-07-17 NOTE — Telephone Encounter (Signed)
Still having extreme pain and needs something done about this

## 2019-07-17 NOTE — Telephone Encounter (Signed)
Patient advised of this. 

## 2019-07-19 ENCOUNTER — Ambulatory Visit: Payer: PPO | Admitting: Pain Medicine

## 2019-07-21 ENCOUNTER — Other Ambulatory Visit: Payer: Self-pay

## 2019-07-21 ENCOUNTER — Ambulatory Visit
Admission: RE | Admit: 2019-07-21 | Discharge: 2019-07-21 | Disposition: A | Discharge status: Home / Self Care | Payer: PPO | Source: Ambulatory Visit | Attending: Pain Medicine | Admitting: Pain Medicine

## 2019-07-21 DIAGNOSIS — M47817 Spondylosis without myelopathy or radiculopathy, lumbosacral region: Secondary | ICD-10-CM | POA: Insufficient documentation

## 2019-07-21 DIAGNOSIS — G8929 Other chronic pain: Secondary | ICD-10-CM | POA: Diagnosis not present

## 2019-07-21 DIAGNOSIS — M47816 Spondylosis without myelopathy or radiculopathy, lumbar region: Secondary | ICD-10-CM

## 2019-07-21 DIAGNOSIS — M5137 Other intervertebral disc degeneration, lumbosacral region: Secondary | ICD-10-CM | POA: Diagnosis not present

## 2019-07-21 DIAGNOSIS — M545 Low back pain, unspecified: Secondary | ICD-10-CM

## 2019-07-21 DIAGNOSIS — M5136 Other intervertebral disc degeneration, lumbar region: Secondary | ICD-10-CM | POA: Diagnosis not present

## 2019-07-24 ENCOUNTER — Encounter: Payer: Self-pay | Admitting: Pain Medicine

## 2019-07-24 NOTE — Progress Notes (Signed)
Pain relief after procedure (treated area only): (Questions asked to patient) 1. Starting about 15 minutes after the procedure, and "while the area was still numb" (from the local anesthetics), were you having any of your usual pain "in that area" (the treated area)?  (NOTE: NOT including the discomfort from the needle sticks.) First 1 hour: 0 better. First 4-6 hours:0 better.  3. How much better is your pain now, when compared to before the procedure? Current benefit: 0 % better. 4. Can you move better now? Improvement in ROM (Range of Motion): No. 5. Can you do more now? Improvement in function: No. 4. Did you have any problems with the procedure? Side-effects/Complications: No.

## 2019-07-24 NOTE — Progress Notes (Signed)
Patient: Kara [name]  Service Category: E/M  Provider: Gaspar Cola, MD  DOB: May 04, 1951  DOS: 07/25/2019  Location: Office  MRN: [id number]  Setting: Ambulatory outpatient  Referring Provider: Tracie Harrier, MD  Type: Established Patient  Specialty: Interventional Pain Management  PCP: Tracie Harrier, MD  Location: Remote location  Delivery: TeleHealth     Virtual Encounter - Pain Management PROVIDER NOTE: Information contained herein reflects review and annotations entered in association with encounter. Interpretation of such information and data should be left to medically-trained personnel. Information provided to patient can be located elsewhere in the medical record under "Patient Instructions". Document created using STT-dictation technology, any transcriptional errors that may result from process are unintentional.    Contact & Pharmacy Preferred: [phone] Home: [phone] (home) Mobile: [phone] (mobile) E-mail: [email or web address]  [place], [place] [address] Phone: [phone] Fax: [phone]   Pre-screening  Kara [name] offered "in-person" vs "virtual" encounter. She indicated preferring virtual for this encounter.   Reason COVID-19*  Social distancing based on CDC and AMA recommendations.   I contacted [name] on 07/25/2019 via telephone.      I clearly identified myself as Gaspar Cola, MD. I verified that I was speaking with the correct person using two identifiers (Name: JENAYA SAAR, and date of birth: 11-12-1950).  Consent I sought verbal advanced consent from [name] for virtual visit interactions. I informed [name] of possible security and privacy concerns, risks, and limitations associated with providing "not-in-person" medical evaluation and management services. I also informed [name] of the  availability of "in-person" appointments. Finally, I informed her that there would be a charge for the virtual visit and that she could be  personally, fully or partially, financially responsible for it. [name] expressed understanding and agreed to proceed.   Historic Elements   Kara [name] is a 69 y.o. year old, female patient evaluated today after her last contact with our practice on 07/17/2019. 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 She also  has a past surgical history that includes Knee surgery; Carpal tunnel release (Bilateral); Colon resection sigmoid (N/A, 11/02/2016); Colostomy (N/A, 11/02/2016); Sigmoidoscopy (N/A, 11/13/2016); PULMONARY VENOGRAPHY (N/A, 11/19/2016); Colonoscopy with propofol (N/A, 02/03/2017); Colon surgery (11/02/2016); IVC FILTER INSERTION (Right, 10/2016); Colostomy reversal (N/A, 02/24/2017); Appendectomy (N/A, 02/24/2017); Lysis of adhesion (N/A, 02/24/2017); laparoscopy (N/A, 02/24/2017); Ileo loop diversion (N/A, 02/24/2017); Sigmoidoscopy (N/A, 05/19/2017); Ileostomy closure (N/A, 06/01/2017); IVC FILTER REMOVAL (N/A, 08/09/2017); Sacroplasty (N/A, 11/08/2017); Cataract extraction w/PHACO (Left, 11/21/2018); and Cataract extraction w/PHACO (Right, 12/12/2018). Kara [name] has a current medication list which includes the following prescription(s): aspirin ec, calcium carbonate, calcium polycarbophil, candesartan, cyclobenzaprine, dulaglutide, gabapentin, glipizide, hydrocodone-acetaminophen, [START ON 07/29/2019] hydrocodone-acetaminophen, loperamide, meclizine, multi-vitamins, ropinirole, warfarin, warfarin, and hydrocodone-acetaminophen. She  reports that she has never  smoked. She has never used smokeless tobacco. She reports that she does not drink alcohol or use drugs. Kara [name] is allergic to adhesive [tape]; other; latex; and morphine and related.   HPI  Today, she is being contacted for a post-procedure assessment.  Post-Procedure Evaluation  Procedure (07/17/2019): Palliative bilateral lumbar facet block under fluoroscopic guidance and no sedation Pre-procedure pain level:  10/10 Post-procedure: 5/10 50% relief  Sedation:  None.  Dewayne Shorter, RN  07/24/2019  8:39 AM  Sign when Signing Visit Pain relief after procedure (treated area only): (Questions asked to patient) 1. Starting about 15 minutes after the procedure, and "while the area was still numb" (from the local anesthetics), were you having any of your usual pain "in that area" (the treated area)?  (NOTE: NOT including the discomfort from the needle sticks.) First 1 hour: 0 better. First 4-6 hours:0 better.  3. How much better is your pain now, when compared to before the procedure? Current benefit: 0 % better. 4. Can you move better now? Improvement in ROM (Range of Motion): No. 5. Can you do more now? Improvement in function: No. 4. Did you have any problems with the procedure? Side-effects/Complications: No.  Current benefits: Defined as benefit that persist at this time.   Analgesia:  No benefit Function: No benefit ROM: No benefit   NOTE: The x-rays ordered reveal progression of her lumbar anterolisthesis when compared to 2018 x-rays.  Pharmacotherapy Assessment  Analgesic: Hydrocodone/APAP 5/325, 1 tab PO QD PRN (5 mg/day of hydrocodone) (#60 was lasting her 4-5 mo. Last filled on 02/08/2019) as of today 02/28/2019, he still has 25 of the 30 pills left.  She indicates not needing a refill at this time. MME: 5 mg/day.   Monitoring: [place] PMP: PDMP reviewed during this encounter.       Pharmacotherapy: No side-effects or adverse reactions reported. Compliance: No  problems identified. Effectiveness: Clinically acceptable. Plan: Refer to "POC".  UDS: No results found for: SUMMARY Laboratory Chemistry Profile   Renal Lab Results  Component Value Date   BUN 10 07/09/2019   CREATININE 0.63 07/09/2019   GFRAA >60 07/09/2019   GFRNONAA >60 07/09/2019    Hepatic Lab Results  Component Value Date   AST 38 07/09/2019   ALT 43 07/09/2019   ALBUMIN 3.6 07/09/2019   ALKPHOS 51 07/09/2019   LIPASE 23 07/09/2019    Electrolytes Lab Results  Component Value Date   NA 139 07/09/2019   K 3.7 07/09/2019   CL 110 07/09/2019   CALCIUM 8.5 (L) 07/09/2019   MG 2.0 11/02/2017   PHOS 3.3 11/07/2016    Bone Lab Results  Component Value Date   25OHVITD1 25 (L) 11/02/2017   25OHVITD2 <1.0 11/02/2017   25OHVITD3 25 11/02/2017    Inflammation (CRP: Acute Phase) (ESR: Chronic Phase) Lab Results  Component Value Date   CRP 5.3 (H) 11/02/2017   ESRSEDRATE 12 11/02/2017      Note: Above Lab results reviewed.  Imaging  DG Lumbar Spine Complete W/Bend CLINICAL DATA:  Low back pain.  EXAM: LUMBAR SPINE - COMPLETE WITH BENDING VIEWS  COMPARISON:  Oct 02, 2017  FINDINGS: The patient is status post bilateral sacroplasty. Multilevel degenerative changes are noted throughout the lumbar spine. There is severe disc height loss at the L4-L5 and L5-S1 levels. Again noted is significant listhesis of L4 on L5 measuring approximately 9-10 mm. There is a 1.4 cm anterolisthesis of L5 on S1. Neither of these changes significantly with flexion or extension views. There is no acute displaced fracture.  IMPRESSION: 1. No acute displaced fracture. 2. Static listhesis at the L4-L5 and L5-S1 levels. The L5-S1 anterolisthesis appears to have significantly progressed since the prior study in 2019. 3. Multilevel disc height loss, most severe at the L4-L5 and L5-S1 levels. 4. Status post bilateral sacroplasty.  Electronically Signed   By: Constance Holster  M.D.   On: 07/21/2019 19:19  Assessment  The primary encounter diagnosis was Chronic low back pain (Primary Area of Pain) (Bilateral) w/ sciatica (Bilateral). 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 were also pertinent to this visit.  Plan of Care  Problem-specific:  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-S1 anterolisthesis. Significant listhesis of L4 on L5 measuring approximately 9-10 mm. This seems to have progressed and at this point will require neurosurgical evaluation.  Ms. RAELI WIENS has a current medication list which includes the following long-term medication(s): calcium carbonate, candesartan, glipizide, hydrocodone-acetaminophen, hydrocodone-acetaminophen, [START ON 07/29/2019] hydrocodone-acetaminophen, ropinirole, warfarin, and warfarin.  Pharmacotherapy (Medications Ordered): No orders of the defined types were placed in this encounter.  Orders:  Orders Placed This Encounter  Procedures  . Ambulatory referral to Neurosurgery    Referral Priority:   Routine    Referral Type:   Surgical    Referral Reason:   Specialty Services Required    Requested Specialty:   Neurosurgery    Number of Visits Requested:   1   Follow-up plan:   Return if symptoms worsen or fail to improve.      Interventional management options:  Considering:   NOTE:COUMADINANTICOAGULATION(Stop:5days  Re-start: 2hrs) NO RFAuntil BMIis <35. Diagnostic L5-S1 LESI Diagnostic bilateral L3TFESI Diagnostic bilateral L4TFESI Diagnostic bilateral L5 TFESI Diagnostic left SI joint block #1  Possible righ SI joint RFA Possible bilateral lumbar facet RFA(NO RFAuntil BMIis <35.) Diagnostic right IA hip joint  injection Diagnostic right femoral +ObturatorNB Possible right femoral + obturator nerve RFA   Palliative PRN treatment(s):   Palliative/Therapeutic left L4-5 LESI #2 Palliative left lumbar facet block#6 Palliativeright lumbar facet block(s) #10 Palliative right SI joint block #4(not as effective as facet blocks) Diagnostic left SI joint block #2 (not as effective in controlling the low back pain as the facet injections) Palliative/Therapeutic (Midline) caudalESI#4     Recent Visits Date Type Provider Dept  07/10/19 Procedure visit Milinda Pointer, MD [name]  06/06/19 Telemedicine Milinda Pointer, [facility]  05/28/19 Telemedicine Milinda Pointer, MD [name]  05/15/19 Procedure visit Milinda Pointer, MD [name]  04/30/19 Telemedicine Milinda Pointer, MD [name]  Showing recent visits within past 90 days and meeting all other requirements   Future Appointments Date Type Provider Dept  07/25/19 Telemedicine Milinda Pointer, MD [name]  08/27/19 Appointment Milinda Pointer, MD [name]  Showing future appointments within next 90 days and meeting all other requirements   I discussed the assessment and treatment plan with the patient. The patient was provided an opportunity to ask questions and all were answered. The patient agreed with the plan and demonstrated an understanding of the instructions.  Patient advised to call back or seek an in-person evaluation if the symptoms or condition worsens.  Duration of encounter: 15 minutes.  Note by: Gaspar Cola, MD Date: 07/25/2019; Time: 10:54 AM

## 2019-07-24 NOTE — Assessment & Plan Note (Signed)
Significant listhesis of L4 on L5 measuring approximately 9-10 mm. This seems to have progressed and at this point will require neurosurgical evaluation.

## 2019-07-25 ENCOUNTER — Ambulatory Visit: Payer: PPO | Attending: Pain Medicine | Admitting: Pain Medicine

## 2019-07-25 ENCOUNTER — Other Ambulatory Visit: Payer: Self-pay

## 2019-07-25 DIAGNOSIS — M5441 Lumbago with sciatica, right side: Secondary | ICD-10-CM

## 2019-07-25 DIAGNOSIS — M48061 Spinal stenosis, lumbar region without neurogenic claudication: Secondary | ICD-10-CM | POA: Diagnosis not present

## 2019-07-25 DIAGNOSIS — M4310 Spondylolisthesis, site unspecified: Secondary | ICD-10-CM

## 2019-07-25 DIAGNOSIS — M4300 Spondylolysis, site unspecified: Secondary | ICD-10-CM

## 2019-07-25 DIAGNOSIS — M5442 Lumbago with sciatica, left side: Secondary | ICD-10-CM

## 2019-07-25 DIAGNOSIS — M47816 Spondylosis without myelopathy or radiculopathy, lumbar region: Secondary | ICD-10-CM | POA: Diagnosis not present

## 2019-07-25 DIAGNOSIS — G8929 Other chronic pain: Secondary | ICD-10-CM

## 2019-07-25 DIAGNOSIS — M4807 Spinal stenosis, lumbosacral region: Secondary | ICD-10-CM

## 2019-07-26 ENCOUNTER — Telehealth: Payer: Self-pay | Admitting: Pain Medicine

## 2019-07-26 ENCOUNTER — Other Ambulatory Visit: Payer: Self-pay | Admitting: Pain Medicine

## 2019-07-26 DIAGNOSIS — M48061 Spinal stenosis, lumbar region without neurogenic claudication: Secondary | ICD-10-CM

## 2019-07-26 DIAGNOSIS — M5136 Other intervertebral disc degeneration, lumbar region: Secondary | ICD-10-CM

## 2019-07-26 DIAGNOSIS — M5137 Other intervertebral disc degeneration, lumbosacral region: Secondary | ICD-10-CM

## 2019-07-26 DIAGNOSIS — M4310 Spondylolisthesis, site unspecified: Secondary | ICD-10-CM

## 2019-07-26 DIAGNOSIS — M4306 Spondylolysis, lumbar region: Secondary | ICD-10-CM

## 2019-07-26 DIAGNOSIS — M4300 Spondylolysis, site unspecified: Secondary | ICD-10-CM

## 2019-07-26 DIAGNOSIS — M4807 Spinal stenosis, lumbosacral region: Secondary | ICD-10-CM

## 2019-07-26 DIAGNOSIS — M4317 Spondylolisthesis, lumbosacral region: Secondary | ICD-10-CM | POA: Insufficient documentation

## 2019-07-26 DIAGNOSIS — M47816 Spondylosis without myelopathy or radiculopathy, lumbar region: Secondary | ICD-10-CM

## 2019-07-26 NOTE — Telephone Encounter (Signed)
Called patient back to clarify what exactly she is needing.  Dr Dossie Arbour has referred [facility] to Neurology, Dr Cari Caraway.  When she called to make the appt the office staff told her she would need an MRI and would need for Dr Dossie Arbour to order that.  She states she is scheduled to see NP on tomorrow, 07/27/19.

## 2019-07-26 NOTE — Telephone Encounter (Signed)
Dr. Izora Ribas wants Dr. Dossie Arbour to send them an order for her to have an MRI. She did not know what kind. She said you could find out from them.

## 2019-07-27 DIAGNOSIS — Z7901 Long term (current) use of anticoagulants: Secondary | ICD-10-CM | POA: Diagnosis not present

## 2019-07-27 DIAGNOSIS — M48062 Spinal stenosis, lumbar region with neurogenic claudication: Secondary | ICD-10-CM | POA: Diagnosis not present

## 2019-08-08 ENCOUNTER — Ambulatory Visit
Admission: RE | Admit: 2019-08-08 | Discharge: 2019-08-08 | Disposition: A | Discharge status: Home / Self Care | Payer: PPO | Source: Ambulatory Visit | Attending: Pain Medicine | Admitting: Pain Medicine

## 2019-08-08 ENCOUNTER — Other Ambulatory Visit: Payer: Self-pay

## 2019-08-08 DIAGNOSIS — M48061 Spinal stenosis, lumbar region without neurogenic claudication: Secondary | ICD-10-CM | POA: Diagnosis not present

## 2019-08-08 DIAGNOSIS — M4306 Spondylolysis, lumbar region: Secondary | ICD-10-CM | POA: Insufficient documentation

## 2019-08-08 DIAGNOSIS — M4317 Spondylolisthesis, lumbosacral region: Secondary | ICD-10-CM | POA: Diagnosis not present

## 2019-08-08 DIAGNOSIS — M4300 Spondylolysis, site unspecified: Secondary | ICD-10-CM | POA: Insufficient documentation

## 2019-08-08 DIAGNOSIS — M4807 Spinal stenosis, lumbosacral region: Secondary | ICD-10-CM | POA: Diagnosis not present

## 2019-08-08 DIAGNOSIS — M5137 Other intervertebral disc degeneration, lumbosacral region: Secondary | ICD-10-CM

## 2019-08-08 DIAGNOSIS — M47816 Spondylosis without myelopathy or radiculopathy, lumbar region: Secondary | ICD-10-CM | POA: Diagnosis not present

## 2019-08-08 DIAGNOSIS — M5136 Other intervertebral disc degeneration, lumbar region: Secondary | ICD-10-CM | POA: Insufficient documentation

## 2019-08-08 DIAGNOSIS — M4310 Spondylolisthesis, site unspecified: Secondary | ICD-10-CM | POA: Insufficient documentation

## 2019-08-08 DIAGNOSIS — M5126 Other intervertebral disc displacement, lumbar region: Secondary | ICD-10-CM | POA: Diagnosis not present

## 2019-08-11 ENCOUNTER — Ambulatory Visit (HOSPITAL_COMMUNITY): Payer: PPO

## 2019-08-13 ENCOUNTER — Encounter: Payer: Self-pay | Admitting: Pain Medicine

## 2019-08-13 ENCOUNTER — Telehealth: Payer: Self-pay | Admitting: Pain Medicine

## 2019-08-13 ENCOUNTER — Telehealth: Payer: Self-pay

## 2019-08-13 NOTE — Telephone Encounter (Signed)
Called patient and she states she is having a lot of pain in her back since she had the Mri on Wednesday March 17,2021. She could hardly get off the table and out of the car. She called Dr Newman Pies office and will not do anything for her until her BMI and A1C is lower. They suggested that she call our office and she if something could be done. She will need a VV to review Mri and see if anything can be done for her.

## 2019-08-13 NOTE — Telephone Encounter (Signed)
Attempted to call cell phone and home with no answer. Left message to call us back so we could review medication/alleg.

## 2019-08-13 NOTE — Telephone Encounter (Signed)
Appointment scheduled for tomorrow.

## 2019-08-13 NOTE — Telephone Encounter (Signed)
Patient lvmail on Friday 08-10-19 stating she spoke with Dr Newman Pies office and they are unable to do anything to help with her pain because her A1C is 10.1. they suggested to call and see if Dr. Dossie Arbour can give her injections in a different area of her back to see if that will give her any relief.  Patient called this am stating she is in a lot of pain and would appreciate it if someone could call soon. Thank you

## 2019-08-14 ENCOUNTER — Ambulatory Visit: Payer: PPO | Attending: Pain Medicine | Admitting: Pain Medicine

## 2019-08-14 ENCOUNTER — Other Ambulatory Visit: Payer: Self-pay

## 2019-08-14 DIAGNOSIS — E1165 Type 2 diabetes mellitus with hyperglycemia: Secondary | ICD-10-CM | POA: Diagnosis not present

## 2019-08-14 DIAGNOSIS — M5136 Other intervertebral disc degeneration, lumbar region: Secondary | ICD-10-CM | POA: Diagnosis not present

## 2019-08-14 DIAGNOSIS — G2581 Restless legs syndrome: Secondary | ICD-10-CM | POA: Diagnosis not present

## 2019-08-14 DIAGNOSIS — Z86711 Personal history of pulmonary embolism: Secondary | ICD-10-CM | POA: Diagnosis not present

## 2019-08-14 DIAGNOSIS — Z5329 Procedure and treatment not carried out because of patient's decision for other reasons: Secondary | ICD-10-CM

## 2019-08-14 DIAGNOSIS — R937 Abnormal findings on diagnostic imaging of other parts of musculoskeletal system: Secondary | ICD-10-CM | POA: Insufficient documentation

## 2019-08-14 DIAGNOSIS — R8290 Unspecified abnormal findings in urine: Secondary | ICD-10-CM | POA: Diagnosis not present

## 2019-08-14 DIAGNOSIS — Z7901 Long term (current) use of anticoagulants: Secondary | ICD-10-CM | POA: Diagnosis not present

## 2019-08-14 DIAGNOSIS — Z6841 Body Mass Index (BMI) 40.0 and over, adult: Secondary | ICD-10-CM | POA: Diagnosis not present

## 2019-08-14 DIAGNOSIS — I10 Essential (primary) hypertension: Secondary | ICD-10-CM | POA: Diagnosis not present

## 2019-08-14 NOTE — Progress Notes (Signed)
Unsuccessful attempt to contact patient for Virtual Visit (Pain Management Telehealth)   Patient provided contact information:  [phone] (home); [phone] (mobile); (Preferred) [phone] [email or web address]   Pre-screening:  Our staff was successful in contacting [name] using the above provided information.   I unsuccessfully attempted to make contact with Kara [name] on 08/14/2019 via telephone. I was unable to complete the virtual encounter due to call going directly to voicemail. I was able to leave a message where I clearly identify myself as Gaspar Cola, MD and I left a message to call us back to reschedule the call.   MRI shows progression of the patient's anterolisthesis at both levels.  We have already referred this patient twice to weight management and bariatric surgery.  There is no doubt in my mind that the patient's weight is significantly and adversely contributing to the patient's condition.  Pharmacotherapy Assessment  Analgesic: Hydrocodone/APAP 5/325, 1 tab PO QD PRN (5 mg/day of hydrocodone) MME: 5 mg/day.   Follow-up plan:   Reschedule Visit.     Interventional management options:  Considering:   NOTE:COUMADINANTICOAGULATION(Stop:5days  Re-start: 2hrs) NO RFAuntil BMIis <35. Diagnostic L5-S1 LESI Diagnostic bilateral L3TFESI Diagnostic bilateral L4TFESI Diagnostic bilateral L5 TFESI Diagnostic left SI joint block #1  Possible righ SI joint RFA Possible bilateral lumbar facet RFA(NO RFAuntil BMIis <35.) Diagnostic right IA hip joint injection Diagnostic right femoral +ObturatorNB Possible right femoral + obturator nerve RFA   Palliative PRN treatment(s):   Palliative/Therapeutic left L4-5 LESI #2 Palliative left lumbar facet block#6 Palliativeright lumbar facet block(s) #10 Palliative right SI joint block #4(not as effective as facet blocks) Diagnostic left SI joint block #2 (not as  effective in controlling the low back pain as the facet injections) Palliative/Therapeutic (Midline) caudalESI#4    Recent Visits Date Type Provider Dept  07/25/19 Telemedicine Milinda Pointer, MD [name]  07/10/19 Procedure visit Milinda Pointer, MD [name]  06/06/19 Telemedicine Milinda Pointer, MD [name]  05/28/19 Telemedicine Milinda Pointer, MD [name]  Showing recent visits within past 90 days and meeting all other requirements   Today's Visits Date Type Provider Dept  08/14/19 Telemedicine Milinda Pointer, MD [name]  Showing today's visits and meeting all other requirements   Future Appointments Date Type Provider Dept  08/27/19 Appointment Milinda Pointer, MD [name]  Showing future appointments within next 90 days and meeting all other requirements    Note by: Gaspar Cola, MD Date: 08/14/2019; Time: 3:48 PM

## 2019-08-16 ENCOUNTER — Ambulatory Visit: Payer: PPO | Attending: Pain Medicine | Admitting: Pain Medicine

## 2019-08-16 ENCOUNTER — Other Ambulatory Visit: Payer: Self-pay

## 2019-08-16 DIAGNOSIS — M47816 Spondylosis without myelopathy or radiculopathy, lumbar region: Secondary | ICD-10-CM | POA: Diagnosis not present

## 2019-08-16 DIAGNOSIS — M4310 Spondylolisthesis, site unspecified: Secondary | ICD-10-CM | POA: Diagnosis not present

## 2019-08-16 DIAGNOSIS — G8929 Other chronic pain: Secondary | ICD-10-CM | POA: Diagnosis not present

## 2019-08-16 DIAGNOSIS — Z6841 Body Mass Index (BMI) 40.0 and over, adult: Secondary | ICD-10-CM

## 2019-08-16 DIAGNOSIS — M5137 Other intervertebral disc degeneration, lumbosacral region: Secondary | ICD-10-CM | POA: Diagnosis not present

## 2019-08-16 DIAGNOSIS — R937 Abnormal findings on diagnostic imaging of other parts of musculoskeletal system: Secondary | ICD-10-CM

## 2019-08-16 DIAGNOSIS — M4300 Spondylolysis, site unspecified: Secondary | ICD-10-CM | POA: Diagnosis not present

## 2019-08-16 DIAGNOSIS — G894 Chronic pain syndrome: Secondary | ICD-10-CM | POA: Diagnosis not present

## 2019-08-16 DIAGNOSIS — M4306 Spondylolysis, lumbar region: Secondary | ICD-10-CM

## 2019-08-16 DIAGNOSIS — M545 Low back pain: Secondary | ICD-10-CM

## 2019-08-16 DIAGNOSIS — Z7901 Long term (current) use of anticoagulants: Secondary | ICD-10-CM

## 2019-08-16 MED ORDER — HYDROCODONE-ACETAMINOPHEN 5-325 MG PO TABS: 1.0000 | ORAL_TABLET | Freq: Three times a day (TID) | ORAL | 0 refills | Status: DC | PRN

## 2019-08-16 NOTE — Progress Notes (Signed)
Patient: Kara [name]  Service Category: E/M  Provider: Gaspar Cola, MD  DOB: 1950-06-01  DOS: 08/16/2019  Location: Office  MRN: [id number]  Setting: Ambulatory outpatient  Referring Provider: Tracie Harrier, MD  Type: Established Patient  Specialty: Interventional Pain Management  PCP: Tracie Harrier, MD  Location: Remote location  Delivery: TeleHealth     Virtual Encounter - Pain Management PROVIDER NOTE: Information contained herein reflects review and annotations entered in association with encounter. Interpretation of such information and data should be left to medically-trained personnel. Information provided to patient can be located elsewhere in the medical record under "Patient Instructions". Document created using STT-dictation technology, any transcriptional errors that may result from process are unintentional.    Contact & Pharmacy Preferred: [phone] Home: [phone] (home) Mobile: [phone] (mobile) E-mail: [email or web address]  [place], [place]  [address] Phone: [phone] Fax: [phone]   Pre-screening  Kara [name] offered "in-person" vs "virtual" encounter. She indicated preferring virtual for this encounter.   Reason COVID-19*  Social distancing based on CDC and AMA recommendations.   I contacted Kara [name] on 08/16/2019 via telephone.      I clearly identified myself as Gaspar Cola, MD. I verified that I was speaking with the correct person using two identifiers (Name: Kara [name], and date of birth: 03/07/1951).  Consent I sought verbal advanced consent from Kara [name] for virtual visit interactions. I informed Kara [name] of possible security and privacy concerns, risks, and limitations associated with providing "not-in-person" medical evaluation and management services. I also informed Kara [name] of the  availability of "in-person" appointments. Finally, I informed her that there would be a charge for the virtual visit and that she could be  personally, fully or partially, financially responsible for it. Kara [name] expressed understanding and agreed to proceed.   Historic Elements   Kara [name] is a 69 y.o. year old, female patient evaluated today after her last contact with our practice on 08/13/2019. 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 She also  has a past surgical history that includes Knee surgery; Carpal tunnel release (Bilateral); Colon resection sigmoid (N/A, 11/02/2016); Colostomy (N/A, 11/02/2016); Sigmoidoscopy (N/A, 11/13/2016); PULMONARY VENOGRAPHY (N/A, 11/19/2016); Colonoscopy with propofol (N/A, 02/03/2017); Colon surgery (11/02/2016); IVC FILTER INSERTION (Right, 10/2016); Colostomy reversal (N/A, 02/24/2017); Appendectomy (N/A, 02/24/2017); Lysis of adhesion (N/A, 02/24/2017); laparoscopy (N/A, 02/24/2017); Ileo loop diversion (N/A, 02/24/2017); Sigmoidoscopy (N/A, 05/19/2017); Ileostomy closure (N/A, 06/01/2017); IVC FILTER REMOVAL (N/A, 08/09/2017); Sacroplasty (N/A, 11/08/2017); Cataract extraction w/PHACO (Left, 11/21/2018); and Cataract extraction w/PHACO (Right, 12/12/2018). Kara [name] has a current medication list which includes the following prescription(s): [START ON 09/06/2019] hydrocodone-acetaminophen, [START ON 10/06/2019] hydrocodone-acetaminophen, [START ON 11/05/2019] hydrocodone-acetaminophen, aspirin ec, calcium carbonate, calcium polycarbophil, candesartan, cyclobenzaprine, dulaglutide, gabapentin, glipizide, loperamide, meclizine, multi-vitamins, ropinirole, warfarin, and  warfarin. She  reports that she has never smoked. She has never used smokeless tobacco. She reports that she does not drink alcohol or use drugs. Kara [name] is allergic to adhesive [tape]; other; latex; and morphine and related.   HPI  Today, she is being contacted for medication management.   The patient's most recent lumbar MRI done on 08/09/2019 reveals the following:  FINDINGS: Segmentation: Vertebral body numbering is kept consistent  with the prior study. There is a rudimentary S1-S2 disc. Alignment: Increased grade 1 anterolisthesis at L4-L5 measuring 7 mm (previously 5 mm). Stable to slightly increased grade 1 anterolisthesis at L5-S1. Alignment is otherwise maintained. Vertebrae: Stable vertebral body heights apart from increased degenerative endplate irregularity at L5-S1. There is no substantial marrow edema or suspicious osseous lesion. 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 Conus medullaris and cauda equina: Conus extends to the L1-L2 level. Conus and cauda equina appear normal. Paraspinal and other soft tissues: Unremarkable.  Disc levels: L1-2: Trace disc bulge. No significant canal or foraminal stenosis. Appearance is similar. L2-3: Facet hypertrophy with ligamentum flavum infolding. No significant canal or foraminal stenosis. Appearance is similar. L3-4: Disc bulge with endplate osteophytic ridging and facet hypertrophy with ligamentum flavum infolding. Minor canal stenosis. Mild to moderate foraminal stenosis. Appearance is similar. L4-5: Anterolisthesis with uncovering of disc bulge. Facet hypertrophy with ligamentum flavum infolding. Minor canal stenosis. Moderate right and marked left foraminal stenosis. Appearance is similar. L5-S1: Anterolisthesis with uncovering of disc bulge. Superimposed central disc protrusion. Facet hypertrophy with ligamentum flavum infolding. Increased moderate canal stenosis with narrowing of the lateral recesses. Increased marked  foraminal stenosis.  IMPRESSION: Multilevel degenerative changes as detailed above. There is increased degenerative anterolisthesis at L4-5 and L5-S1. Canal and foraminal stenosis have progressed at L5-S1.  Interpretation:  Increased grade 1 anterolisthesis at L4-5 measuring 7 mm (previously 5 mm). Stable to slightly increased grade 1 anterolisthesis at L5-S1. - This is likely due to the excess weight imposed on the lumbar spine. increased degenerative endplate irregularity at L5-S1. - Degenerative.   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 - Possible midline pain in the transition area between thoracic and lumbar L2-3: Facet hypertrophy with ligamentum flavum infolding. - Possible low back pain, bilaterally, from the facet joint arthropathy. L3-4: bulge with endplate osteophytic ridging and facet hypertrophy with ligamentum flavum infolding. Minor canal stenosis. Mild to moderate foraminal stenosis. - Bulge could be responsible for some midline low back pain secondary to pressure over the posterior longitudinal ligament.  The facet arthropathy could be responsible for paramedial, bilateral, axial low back pain.  Central canal stenosis could cause irritation of all nerve roots, bilaterally, from L4 down.  The foraminal stenosis could be responsible for pain that may be either unilateral or bilateral, affecting the L3 nerve root, suggesting pain over the hip area, anterior thigh, and medial aspect of the knee(s). L4-5: Anterolisthesis with uncovering of disc bulge. Facet hypertrophy with ligamentum flavum infolding. Minor canal stenosis. Moderate right and marked left foraminal stenosis. - The disc bulge could be responsible for some midline low back pain secondary to pressure over the posterior longitudinal ligament.  The lumbar facet hypertrophy/arthropathy could be responsible and account for unilateral/bilateral axial low back pain.  The central canal stenosis could be  responsible for irritating all lumbosacral nerve roots from L5 down causing bilateral lower extremity pain then medical all the way into her feet.  The foraminal stenosis could be responsible for irritation of the L4 nerve root, either unilaterally, or bilaterally, with a preference towards the left side.  An L4 radiculitis with trigger pain that would be experienced over the area of the hip, anterior thigh, middle of the knee, inner portion of the distal leg and ending at the level of the arch of the foot in the medial aspect of the foot. L5-S1: Anterolisthesis with uncovering of disc bulge. Superimposed central disc protrusion. Facet  hypertrophy with ligamentum flavum infolding. Increased moderate canal stenosis with narrowing of the lateral recesses. Increased marked foraminal stenosis. - The disc bulge could be responsible for some midline low back pain secondary to pressure over the posterior longitudinal ligament.  This superimposed central disc protrusion is likely to decrease the diameter of the central canal contributing to central spinal stenosis and therefore increasing the possibility of irritation of all sacral nerve roots from S1 down, either unilaterally, but more likely to be bilaterally.  Specifically, this would trigger pain that would go down the back of the leg all the way into the bottom of the foot and lateral aspect of the foot if the symptoms from the S1 nerve root predominate.  Facet hypertrophy/arthropathy is likely to be responsible for the unilateral/bilateral axial low back pain.  The ligamentum flavum infolding along with the bulge in the protrusion would contribute towards decreasing the central canal diameter and therefore worsening the central spinal stenosis.  Then narrowing of the lateral recess would tend to affect the S1 nerve roots, individually or together (if bilateral) causing lower extremity pain that will run down the posterior aspect of the leg into the bottom and lateral  aspect of the foot.  Foraminal stenosis on the other hand would irritate the L5 nerve root either unilaterally or bilaterally triggering pain that would start in the buttocks area travel through the upper leg and the posterior aspect and making its way into the lateral aspect and then below the knee turning onto the top of the foot and the big toe or the web between the big toe and the second toe.  The ligamentum flavum infolding is a phenomenon seen with decreased intervertebral disc space secondary to degenerative disc disease causing a decreased distance between vertebral bodies and causing the ligament to fold into the canal decreasing the diameter of that canal.  This phenomenon is exacerbated secondary to the excess pressure exerted over the lumbar portion of the spine.  Recommendation:  It is extremely clear and evident that the phenomenon of degeneration is rather accelerated in this case.  Anything that can be done towards manipulating the factors that adversely influence this degeneration will probably result in a slow down of the degenerative process and a significant improvement in the patient's symptoms.  In this particular case, it is evident that a major contributing factor is the patient's morbid obesity.  At this point, it is imperative that the patient redirect her attention to decreasing her weight.  Because she is a chronic pain patient, it cannot be expected that she would be able to accomplish this through an increase exercise program.  Unfortunately, this will need to be done by controlling intake and looking at the possibility of bariatric surgery.  Pharmacotherapy Assessment  Analgesic: Hydrocodone/APAP 5/325, 1 tab PO QD PRN (5 mg/day of hydrocodone) MME: 5 mg/day.   Monitoring: [facility] PMP: PDMP reviewed during this encounter.       Pharmacotherapy: No side-effects or adverse reactions reported. Compliance: No problems identified. Effectiveness: Clinically acceptable. Plan: Refer  to "POC".  UDS: No results found for: SUMMARY Laboratory Chemistry Profile   Renal Lab Results  Component Value Date   BUN 10 07/09/2019   CREATININE 0.63 07/09/2019   GFRAA >60 07/09/2019   GFRNONAA >60 07/09/2019    Hepatic Lab Results  Component Value Date   AST 38 07/09/2019   ALT 43 07/09/2019   ALBUMIN 3.6 07/09/2019   ALKPHOS 51 07/09/2019   LIPASE 23 07/09/2019  Electrolytes Lab Results  Component Value Date   NA 139 07/09/2019   K 3.7 07/09/2019   CL 110 07/09/2019   CALCIUM 8.5 (L) 07/09/2019   MG 2.0 11/02/2017   PHOS 3.3 11/07/2016    Bone Lab Results  Component Value Date   25OHVITD1 25 (L) 11/02/2017   25OHVITD2 <1.0 11/02/2017   25OHVITD3 25 11/02/2017    Inflammation (CRP: Acute Phase) (ESR: Chronic Phase) Lab Results  Component Value Date   CRP 5.3 (H) 11/02/2017   ESRSEDRATE 12 11/02/2017      Note: Above Lab results reviewed.  Imaging  MR LUMBAR SPINE WO CONTRAST CLINICAL DATA:  Back and leg pain, history of surgery  EXAM: MRI LUMBAR SPINE WITHOUT CONTRAST  TECHNIQUE: Multiplanar, multisequence MR imaging of the lumbar spine was performed. No intravenous contrast was administered.  COMPARISON:  2019  FINDINGS: Segmentation: Vertebral body numbering is kept consistent with the prior study. There is a rudimentary S1-S2 disc.  Alignment: Increased grade 1 anterolisthesis at L4-L5 measuring 7 mm (previously 5 mm). Stable to slightly increased grade 1 anterolisthesis at L5-S1. Alignment is otherwise maintained.  Vertebrae: Stable vertebral body heights apart from increased degenerative endplate irregularity at L5-S1. There is no substantial marrow edema or suspicious osseous lesion. 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  Conus medullaris and cauda equina: Conus extends to the L1-L2 level. Conus and cauda equina appear normal.  Paraspinal and other soft tissues: Unremarkable.  Disc levels:  L1-L2: Trace disc bulge. No  significant canal or foraminal stenosis. Appearance is similar.  L2-L3: Facet hypertrophy with ligamentum flavum infolding. No significant canal or foraminal stenosis. Appearance is similar.  L3-L4: Disc bulge with endplate osteophytic ridging and facet hypertrophy with ligamentum flavum infolding. Minor canal stenosis. Mild to moderate foraminal stenosis. Appearance is similar.  L4-L5: Anterolisthesis with uncovering of disc bulge. Facet hypertrophy with ligamentum flavum infolding. Minor canal stenosis. Moderate right and marked left foraminal stenosis. Appearance is similar.  L5-S1: Anterolisthesis with uncovering of disc bulge. Superimposed central disc protrusion. Facet hypertrophy with ligamentum flavum infolding. Increased moderate canal stenosis with narrowing of the lateral recesses. Increased marked foraminal stenosis.  IMPRESSION: Multilevel degenerative changes as detailed above. There is increased degenerative anterolisthesis at L4-L5 and L5-S1. Canal and foraminal stenosis have progressed at L5-S1.  Electronically Signed   By: Macy Mis M.D.   On: 08/09/2019 09:22  Assessment  The primary encounter diagnosis was Lumbar facet syndrome (Bilateral). 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  Plan of Care  Problem-specific:  No problem-specific Assessment & Plan notes found for this encounter.  Kara [name] has a current medication list which includes the following long-term  medication(s): [START ON 09/06/2019] hydrocodone-acetaminophen, [START ON 10/06/2019] hydrocodone-acetaminophen, [START ON 11/05/2019] hydrocodone-acetaminophen, calcium carbonate, candesartan, glipizide, ropinirole, warfarin, and warfarin.  Pharmacotherapy (Medications Ordered): Meds ordered this encounter  Medications  . HYDROcodone-acetaminophen (NORCO/VICODIN) 5-325 MG tablet    Sig: Take 1 tablet by mouth every 8 (eight) hours as needed for severe pain. Must last 30 days.    Dispense:  20 tablet    Refill:  0    Chronic Pain: STOP Act (Not applicable) Fill 1 day early if closed on refill date. Do not fill until: 09/06/2019. To last until: 10/06/2019. Avoid benzodiazepines within 8 hours of opioids  . HYDROcodone-acetaminophen (NORCO/VICODIN) 5-325 MG tablet    Sig: Take 1 tablet by mouth every 8 (eight) hours as needed for severe pain. Must last 30 days.    Dispense:  20 tablet    Refill:  0    Chronic Pain: STOP Act (Not applicable) Fill 1 day early if closed on refill date. Do not fill until: 10/06/2019. To last until: 11/05/2019. Avoid benzodiazepines within 8 hours of opioids  . HYDROcodone-acetaminophen (NORCO/VICODIN) 5-325 MG tablet    Sig: Take 1 tablet by mouth every 8 (eight) hours as needed for severe pain. Must last 30 days.    Dispense:  20 tablet    Refill:  0    Chronic Pain: STOP Act (Not applicable) Fill 1 day early if closed on refill date. Do not fill until: 11/05/2019. To last until: 12/05/2019. Avoid benzodiazepines within 8 hours of opioids   Orders:  Orders Placed This Encounter  Procedures  . LUMBAR FACET(MEDIAL BRANCH NERVE BLOCK) MBNB    Standing Status:   Future    Standing Expiration Date:   09/16/2019    Scheduling Instructions:     Procedure: Lumbar facet block (AKA.: Lumbosacral medial branch nerve block)     Side: Bilateral     Level: L3-4, L4-5, & L5-S1 Facets (L2, L3, L4, L5, & S1 Medial Branch Nerves)     Sedation: Patient's choice.     Timeframe: ASAA     Order Specific Question:   Where will this procedure be performed?    Answer:   [name]  . Blood Thinner Instructions to Nursing    Scheduling Instructions:     Always stop the Coumadin (Warfarin) X 5 days prior to procedure or surgery.   Follow-up plan:   Return in about 4 months (around 12/05/2019) for (VV), (MM), in addition, Procedure (w/ sedation): (B) L-FCT BLK, (ASAP), (Blood-thinner Protocol).      Interventional management options:  Considering:   NOTE:COUMADINANTICOAGULATION(Stop:5days  Re-start: 2hrs) NO RFAuntil BMIis <35. Diagnostic L5-S1 LESI Diagnostic bilateral L3TFESI Diagnostic bilateral L4TFESI Diagnostic bilateral L5 TFESI Diagnostic left SI joint block #1  Possible righ SI joint RFA Possible bilateral lumbar facet RFA(NO RFAuntil BMIis <35.) Diagnostic right IA hip joint injection Diagnostic right femoral +ObturatorNB Possible right femoral + obturator nerve RFA   Palliative PRN treatment(s):   Palliative/Therapeutic left L4-5 LESI #2 Palliative left lumbar facet block#6 Palliativeright lumbar facet block(s) #10 Palliative right SI joint block #4(not as effective as facet blocks) Diagnostic left SI joint block #2 (not as effective in controlling the low back pain as the facet injections) Palliative/Therapeutic (Midline) caudalESI#4    Recent Visits Date Type Provider Dept  08/14/19 Telemedicine Milinda Pointer, [facility]  07/25/19 Telemedicine Milinda Pointer, MD [name]  07/10/19 Procedure visit Milinda Pointer, MD [name]  06/06/19 Telemedicine Milinda Pointer, MD [name]  05/28/19 Telemedicine Milinda Pointer, MD [name]  Showing recent visits within past 90 days and meeting all other requirements   Today's Visits Date Type Provider Dept  08/16/19 Telemedicine Milinda Pointer, MD [name]  Showing  today's visits and meeting all other requirements   Future Appointments Date Type Provider Dept  08/27/19 Appointment Milinda Pointer, MD [name]  Showing future appointments  within next 90 days and meeting all other requirements   I discussed the assessment and treatment plan with the patient. The patient was provided an opportunity to ask questions and all were answered. The patient agreed with the plan and demonstrated an understanding of the instructions.  Patient advised to call back or seek an in-person evaluation if the symptoms or condition worsens.  Duration of encounter: 27 minutes.  Note by: Gaspar Cola, MD Date: 08/16/2019; Time: 9:44 AM

## 2019-08-16 NOTE — Patient Instructions (Signed)
______________________________________________________________________________________________  Weight Management Required  URGENT: Your weight has been found to be adversely affecting your health.  Dear Kara [name]:  Your current Estimated body mass index is 39.68 kg/m as calculated from the following:   Height as of 07/10/19: '5\' 3"'  (1.6 m).   Weight as of 07/10/19: 224 lb (101.6 kg).  Please use the table below to identify your weight category and associated incidence of chronic pain, secondary to your weight.  Body Mass Index (BMI) Classification BMI level (kg/m2) Category Associated incidence of chronic pain  <18  Underweight   18.5-24.9 Ideal body weight   25-29.9 Overweight  20%  30-34.9 Obese (Class I)  68%  35-39.9 Severe obesity (Class II)  136%  >40 Extreme obesity (Class III)  254%   In addition: You will be considered "Morbidly Obese", if your BMI is above 30 and you have one or more of the following conditions which are known to be caused and/or directly associated with obesity: 1.    Type 2 Diabetes (Which in turn can lead to cardiovascular diseases (CVD), stroke, peripheral vascular diseases (PVD), retinopathy, nephropathy, and neuropathy) 2.    Cardiovascular Disease (High Blood Pressure; Congestive Heart Failure; High Cholesterol; Coronary Artery Disease; Angina; or History of Heart Attacks) 3.    Breathing problems (Asthma; obesity-hypoventilation syndrome; obstructive sleep apnea; chronic inflammatory airway disease; reactive airway disease; or shortness of breath) 4.    Chronic kidney disease 5.    Liver disease (nonalcoholic fatty liver disease) 6.    High blood pressure 7.    Acid reflux (gastroesophageal reflux disease; heartburn) 8.    Osteoarthritis (OA) (with any of the following: hip pain; knee pain; and/or low back pain) 9.    Low back pain (Lumbar Facet Syndrome; and/or Degenerative Disc Disease) 10.  Hip pain (Osteoarthritis of hip) (For every  1 lbs of added body weight, there is a 2 lbs increase in pressure inside of each hip articulation. 1:2 mechanical relationship) 11.  Knee pain (Osteoarthritis of knee) (For every 1 lbs of added body weight, there is a 4 lbs increase in pressure inside of each knee articulation. 1:4 mechanical relationship) (patients with a BMI>30 kg/m2 were 6.8 times more likely to develop knee OA than normal-weight individuals) 12.  Cancer: Epidemiological studies have shown that obesity is a risk factor for: post-menopausal breast cancer; cancers of the endometrium, colon and kidney cancer; malignant adenomas of the oesophagus. Obese subjects have an approximately 1.5-3.5-fold increased risk of developing these cancers compared with normal-weight subjects, and it has been estimated that between 15 and 45% of these cancers can be attributed to overweight. More recent studies suggest that obesity may also increase the risk of other types of cancer, including pancreatic, hepatic and gallbladder cancer. (Ref: Obesity and cancer. Pischon T, Nthlings U, [employer] H. Proc Nutr Soc. 2008 May;67(2):128-45. doi: 80.9983/J8250539767341937.) The International Agency for Research on Cancer (IARC) has identified 13 cancers associated with overweight and obesity: meningioma, multiple myeloma, adenocarcinoma of the esophagus, and cancers of the thyroid, postmenopausal breast cancer, gallbladder, stomach, liver, pancreas, kidney, ovaries, uterus, colon and rectal (colorectal) cancers. 13 percent of all cancers diagnosed in women and 24 percent of those diagnosed in men are associated with overweight and obesity.  Recommendation: At this point it is urgent that you take a step back and concentrate in loosing weight. Dedicate 100% of your efforts on this task. Nothing else will improve your health more than bringing your weight down and your BMI to less  than 30. If you are here, you probably have chronic pain. Because most chronic pain patients  have difficulty exercising secondary to their pain, you must rely on proper nutrition and diet in order to lose the weight. If your BMI is above 40, you should seriously consider bariatric surgery. A realistic goal is to lose 10% of your body weight over a period of 12 months.  Be honest to yourself, if over time you have unsuccessfully tried to lose weight, then it is time for you to seek professional help and to enter a medically supervised weight management program, and/or undergo bariatric surgery. Stop procrastinating.   Pain management considerations:  1.    Pharmacological Problems: Be advised that the use of opioid analgesics (oxycodone; hydrocodone; morphine; methadone; codeine; and all of their derivatives) have been associated with decreased metabolism and weight gain.  For this reason, should we see that you are unable to lose weight while taking these medications, it may become necessary for [place] to taper down and indefinitely discontinue them.  2.    Technical Problems: The incidence of successful interventional therapies decreases as the patient's BMI increases. It is much more difficult to accomplish a safe and effective interventional therapy on a patient with a BMI above 35. 3.    Radiation Exposure Problems: The x-rays machine, used to accomplish injection therapies, will automatically increase their x-ray output in order to capture an appropriate bone image. This means that radiation exposure increases exponentially with the patient's BMI. (The higher the BMI, the higher the radiation exposure.) Although the level of radiation used at a given time is still safe to the patient, it is not for the physician and/or assisting staff. Unfortunately, radiation exposure is accumulative. Because physicians and the staff have to do procedures and be exposed on a daily basis, this can result in health problems such as cancer and radiation burns. Radiation exposure to the staff is monitored by the  radiation batches that they wear. The exposure levels are reported back to the staff on a quarterly basis. Depending on levels of exposure, physicians and staff may be obligated by law to decrease this exposure. This means that they have the right and obligation to refuse providing therapies where they may be overexposed to radiation. For this reason, physicians may decline to offer therapies such as radiofrequency ablation or implants to patients with a BMI above 40. 4.    Current Trends: Be advised that the current trend is to no longer offer certain therapies to patients with a BMI equal to, or above 35, due to increase perioperative risks, increased technical procedural difficulties, and excessive radiation exposure to healthcare personnel.  ______________________________________________________________________________________________   Your lumbar MRI done on 08/09/2019 revealed the following:  FINDINGS: Segmentation: Vertebral body numbering is kept consistent with the prior study. There is a rudimentary S1-S2 disc. Alignment: Increased grade 1 anterolisthesis at L4-L5 measuring 7 mm (previously 5 mm). Stable to slightly increased grade 1 anterolisthesis at L5-S1. Alignment is otherwise maintained. Vertebrae: Stable vertebral body heights apart from increased degenerative endplate irregularity at L5-S1. There is no substantial marrow edema or suspicious osseous lesion. 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 Conus medullaris and cauda equina: Conus extends to the L1-L2 level. Conus and cauda equina appear normal. Paraspinal and other soft tissues: Unremarkable.  Disc levels: L1-2: Trace disc bulge. No significant canal or foraminal stenosis. Appearance is similar. L2-3: Facet hypertrophy with ligamentum flavum infolding. No significant canal or foraminal stenosis. Appearance is similar.  L3-4: Disc bulge with endplate osteophytic ridging and facet hypertrophy with ligamentum flavum  infolding. Minor canal stenosis. Mild to moderate foraminal stenosis. Appearance is similar. L4-5: Anterolisthesis with uncovering of disc bulge. Facet hypertrophy with ligamentum flavum infolding. Minor canal stenosis. Moderate right and marked left foraminal stenosis. Appearance is similar. L5-S1: Anterolisthesis with uncovering of disc bulge. Superimposed central disc protrusion. Facet hypertrophy with ligamentum flavum infolding. Increased moderate canal stenosis with narrowing of the lateral recesses. Increased marked foraminal stenosis.  IMPRESSION: Multilevel degenerative changes as detailed above. There is increased degenerative anterolisthesis at L4-5 and L5-S1. Canal and foraminal stenosis have progressed at L5-S1.  Interpretation:  Increased grade 1 anterolisthesis at L4-5 measuring 7 mm (previously 5 mm). Stable to slightly increased grade 1 anterolisthesis at L5-S1. - This is likely due to the excess weight imposed on the lumbar spine. increased degenerative endplate irregularity at L5-S1. - Degenerative.   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 - Possible midline pain in the transition area between thoracic and lumbar L2-3: Facet hypertrophy with ligamentum flavum infolding. - Possible low back pain, bilaterally, from the facet joint arthropathy. L3-4: bulge with endplate osteophytic ridging and facet hypertrophy with ligamentum flavum infolding. Minor canal stenosis. Mild to moderate foraminal stenosis. - Bulge could be responsible for some midline low back pain secondary to pressure over the posterior longitudinal ligament.  The facet arthropathy could be responsible for paramedial, bilateral, axial low back pain.  Central canal stenosis could cause irritation of all nerve roots, bilaterally, from L4 down.  The foraminal stenosis could be responsible for pain that may be either unilateral or bilateral, affecting the L3 nerve root, suggesting pain over  the hip area, anterior thigh, and medial aspect of the knee(s). L4-5: Anterolisthesis with uncovering of disc bulge. Facet hypertrophy with ligamentum flavum infolding. Minor canal stenosis. Moderate right and marked left foraminal stenosis. - The disc bulge could be responsible for some midline low back pain secondary to pressure over the posterior longitudinal ligament.  The lumbar facet hypertrophy/arthropathy could be responsible and account for unilateral/bilateral axial low back pain.  The central canal stenosis could be responsible for irritating all lumbosacral nerve roots from L5 down causing bilateral lower extremity pain then medical all the way into her feet.  The foraminal stenosis could be responsible for irritation of the L4 nerve root, either unilaterally, or bilaterally, with a preference towards the left side.  An L4 radiculitis with trigger pain that would be experienced over the area of the hip, anterior thigh, middle of the knee, inner portion of the distal leg and ending at the level of the arch of the foot in the medial aspect of the foot. L5-S1: Anterolisthesis with uncovering of disc bulge. Superimposed central disc protrusion. Facet hypertrophy with ligamentum flavum infolding. Increased moderate canal stenosis with narrowing of the lateral recesses. Increased marked foraminal stenosis. - The disc bulge could be responsible for some midline low back pain secondary to pressure over the posterior longitudinal ligament.  This superimposed central disc protrusion is likely to decrease the diameter of the central canal contributing to central spinal stenosis and therefore increasing the possibility of irritation of all sacral nerve roots from S1 down, either unilaterally, but more likely to be bilaterally.  Specifically, this would trigger pain that would go down the back of the leg all the way into the bottom of the foot and lateral aspect of the foot if the symptoms from the S1 nerve root  predominate.  Facet hypertrophy/arthropathy is likely to be responsible for the unilateral/bilateral axial low back pain.  The ligamentum flavum infolding along with the bulge in the protrusion would contribute towards decreasing the central canal diameter and therefore worsening the central spinal stenosis.  Then narrowing of the lateral recess would tend to affect the S1 nerve roots, individually or together (if bilateral) causing lower extremity pain that will run down the posterior aspect of the leg into the bottom and lateral aspect of the foot.  Foraminal stenosis on the other hand would irritate the L5 nerve root either unilaterally or bilaterally triggering pain that would start in the buttocks area travel through the upper leg and the posterior aspect and making its way into the lateral aspect and then below the knee turning onto the top of the foot and the big toe or the web between the big toe and the second toe.  The ligamentum flavum infolding is a phenomenon seen with decreased intervertebral disc space secondary to degenerative disc disease causing a decreased distance between vertebral bodies and causing the ligament to fold into the canal decreasing the diameter of that canal.  This phenomenon is exacerbated secondary to the excess pressure exerted over the lumbar portion of the spine.  Recommendation:  It is extremely clear and evident that the phenomenon of degeneration is rather accelerated in this case.  Anything that can be done towards manipulating the factors that adversely influence this degeneration will probably result in a slow down of the degenerative process and a significant improvement in the patient's symptoms.  In this particular case, it is evident that a major contributing factor is the patient's morbid obesity.  At this point, it is imperative that the patient redirect her attention to decreasing her weight.  Because she is a chronic pain patient, it cannot be expected that  she would be able to accomplish this through an increase exercise program.  Unfortunately, this will need to be done by controlling intake and looking at the possibility of bariatric surgery.

## 2019-08-17 DIAGNOSIS — E1165 Type 2 diabetes mellitus with hyperglycemia: Secondary | ICD-10-CM | POA: Diagnosis not present

## 2019-08-17 DIAGNOSIS — Z6841 Body Mass Index (BMI) 40.0 and over, adult: Secondary | ICD-10-CM | POA: Diagnosis not present

## 2019-08-17 DIAGNOSIS — I10 Essential (primary) hypertension: Secondary | ICD-10-CM | POA: Diagnosis not present

## 2019-08-17 DIAGNOSIS — G2581 Restless legs syndrome: Secondary | ICD-10-CM | POA: Diagnosis not present

## 2019-08-17 DIAGNOSIS — M5136 Other intervertebral disc degeneration, lumbar region: Secondary | ICD-10-CM | POA: Diagnosis not present

## 2019-08-20 ENCOUNTER — Ambulatory Visit: Payer: PPO | Attending: Internal Medicine

## 2019-08-20 DIAGNOSIS — Z23 Encounter for immunization: Secondary | ICD-10-CM

## 2019-08-20 NOTE — Progress Notes (Signed)
   Covid-19 Vaccination Clinic  Name:  Kara [name]    MRN: [id number] DOB: 11-26-1950  08/20/2019  [name] was observed post Covid-19 immunization for 15 minutes without incident. She was provided with Vaccine Information Sheet and instruction to access the V-Safe system.   [name] was instructed to call 911 with any severe reactions post vaccine: Marland Kitchen Difficulty breathing  . Swelling of face and throat  . A fast heartbeat  . A bad rash all over body  . Dizziness and weakness   Immunizations Administered    Name Date Dose VIS Date Route   Pfizer COVID-19 Vaccine 08/20/2019  1:45 PM 0.3 mL 05/04/2019 Intramuscular   Manufacturer: [place]   Lot: [id number]   [place]: [id number]

## 2019-08-27 ENCOUNTER — Telehealth: Payer: PPO | Admitting: Pain Medicine

## 2019-08-27 DIAGNOSIS — E1165 Type 2 diabetes mellitus with hyperglycemia: Secondary | ICD-10-CM | POA: Diagnosis not present

## 2019-08-27 DIAGNOSIS — M5416 Radiculopathy, lumbar region: Secondary | ICD-10-CM | POA: Diagnosis not present

## 2019-08-27 DIAGNOSIS — M4310 Spondylolisthesis, site unspecified: Secondary | ICD-10-CM | POA: Diagnosis not present

## 2019-08-27 DIAGNOSIS — Z0000 Encounter for general adult medical examination without abnormal findings: Secondary | ICD-10-CM | POA: Diagnosis not present

## 2019-08-27 DIAGNOSIS — Z6841 Body Mass Index (BMI) 40.0 and over, adult: Secondary | ICD-10-CM | POA: Diagnosis not present

## 2019-08-27 DIAGNOSIS — M5136 Other intervertebral disc degeneration, lumbar region: Secondary | ICD-10-CM | POA: Diagnosis not present

## 2019-08-27 DIAGNOSIS — R200 Anesthesia of skin: Secondary | ICD-10-CM | POA: Diagnosis not present

## 2019-08-27 DIAGNOSIS — G2581 Restless legs syndrome: Secondary | ICD-10-CM | POA: Diagnosis not present

## 2019-08-27 DIAGNOSIS — R791 Abnormal coagulation profile: Secondary | ICD-10-CM | POA: Diagnosis not present

## 2019-08-27 DIAGNOSIS — R8290 Unspecified abnormal findings in urine: Secondary | ICD-10-CM | POA: Diagnosis not present

## 2019-08-27 DIAGNOSIS — N390 Urinary tract infection, site not specified: Secondary | ICD-10-CM | POA: Diagnosis not present

## 2019-08-27 NOTE — Patient Instructions (Signed)

## 2019-08-27 NOTE — Progress Notes (Signed)
PROVIDER NOTE: Information contained herein reflects review and annotations entered in association with encounter. Interpretation of such information and data should be left to medically-trained personnel. Information provided to patient can be located elsewhere in the medical record under "Patient Instructions". Document created using STT-dictation technology, any transcriptional errors that may result from process are unintentional.    Patient: Kara [name]  Service Category: Procedure  Provider: Gaspar Cola, MD  DOB: [birth year]  DOS: 08/28/2019  Location: [facility]  MRN: [id number]  Setting: Ambulatory - outpatient  Referring Provider: Tracie Harrier, MD  Type: Established Patient  Specialty: Interventional Pain Management  PCP: Tracie Harrier, MD   Primary Reason for Visit: Interventional Pain Management Treatment. CC: Back Pain (lower)  Procedure:          Anesthesia, Analgesia, Anxiolysis:  Type: Lumbar Facet, Medial Branch Block(s)          Primary Purpose: Palliative Region: Posterolateral Lumbosacral Spine Level: L2, L3, L4, L5, & S1 Medial Branch Level(s). Injecting these levels blocks the L3-4, L4-5, and L5-S1 lumbar facet joints. Laterality: Bilateral  Type: Local Anesthesia Indication(s): Analgesia         Route: Infiltration ([place]/IM) IV Access: Declined Sedation: Declined  Local Anesthetic: Lidocaine 1-2%  Position: Prone   Indications: 1. Lumbar facet syndrome (Bilateral)   2. Spondylosis without myelopathy or radiculopathy, lumbosacral region   3. Lumbar facet arthropathy (L3-4, L4-5, and L5-S1) (Bilateral)   4. 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 Grade 1-2 Anterolisthesis of L4/L5 & L5/S1   6. DDD (degenerative disc disease), lumbosacral   7. Chronic low back pain (1ry area of Pain) (Bilateral) w/o sciatica    Pain Score: Pre-procedure: 10-Worst pain ever/10 Post-procedure: 0-No pain/10   Pre-op Assessment:    Kara [name] is a 69 y.o. (year old), female patient, seen today for interventional treatment. She  has a past surgical history that includes Knee surgery; Carpal tunnel release (Bilateral); Colon resection sigmoid (N/A, 11/02/2016); Colostomy (N/A, 11/02/2016); Sigmoidoscopy (N/A, 11/13/2016); PULMONARY VENOGRAPHY (N/A, 11/19/2016); Colonoscopy with propofol (N/A, 02/03/2017); Colon surgery (11/02/2016); IVC FILTER INSERTION (Right, 10/2016); Colostomy reversal (N/A, 02/24/2017); Appendectomy (N/A, 02/24/2017); Lysis of adhesion (N/A, 02/24/2017); laparoscopy (N/A, 02/24/2017); [place] loop diversion (N/A, 02/24/2017); Sigmoidoscopy (N/A, 05/19/2017); Ileostomy closure (N/A, 06/01/2017); IVC FILTER REMOVAL (N/A, 08/09/2017); Sacroplasty (N/A, 11/08/2017); Cataract extraction w/PHACO (Left, 11/21/2018); and Cataract extraction w/PHACO (Right, 12/12/2018). Kara [name] has a current medication list which includes the following prescription(s): aspirin ec, calcium carbonate, calcium polycarbophil, candesartan, vitamin d3, cyclobenzaprine, dulaglutide, gabapentin, glipizide, [START ON 09/06/2019] hydrocodone-acetaminophen, [START ON 10/06/2019] hydrocodone-acetaminophen, [START ON 11/05/2019] hydrocodone-acetaminophen, loperamide, meclizine, multi-vitamins, ropinirole, vitamin b-12, warfarin, and warfarin. Her primarily concern today is the Back Pain (lower)  Initial Vital Signs:  Pulse/HCG Rate: 92  Temp: 97.6 F (36.4 C) Resp: 18 BP: (!) 144/73 SpO2: 95 %  BMI: Estimated body mass index is 40.03 kg/m as calculated from the following:   Height as of this encounter: 5\' 3"  (1.6 m).   Weight as of this encounter: 226 lb (102.5 kg).  Risk Assessment: Allergies: Reviewed. She is allergic to adhesive [tape]; other; latex; and morphine and related.  Allergy Precautions: None required Coagulopathies: Reviewed. None identified.  Blood-thinner therapy: None at this time Active Infection(s): Reviewed. None  identified. Kara [name] is afebrile  Site Confirmation: Kara [name] was asked to confirm the procedure and laterality before marking the site Procedure checklist: Completed Consent: Before the procedure and under the influence of no sedative(s), amnesic(s),  or anxiolytics, the patient was informed of the treatment options, risks and possible complications. To fulfill our ethical and legal obligations, as recommended by the American Medical Association's Code of Ethics, I have informed the patient of my clinical impression; the nature and purpose of the treatment or procedure; the risks, benefits, and possible complications of the intervention; the alternatives, including doing nothing; the risk(s) and benefit(s) of the alternative treatment(s) or procedure(s); and the risk(s) and benefit(s) of doing nothing. The patient was provided information about the general risks and possible complications associated with the procedure. These may include, but are not limited to: failure to achieve desired goals, infection, bleeding, organ or nerve damage, allergic reactions, paralysis, and death. In addition, the patient was informed of those risks and complications associated to Spine-related procedures, such as failure to decrease pain; infection (i.e.: Meningitis, epidural or intraspinal abscess); bleeding (i.e.: epidural hematoma, subarachnoid hemorrhage, or any other type of intraspinal or peri-dural bleeding); organ or nerve damage (i.e.: Any type of peripheral nerve, nerve root, or spinal cord injury) with subsequent damage to sensory, motor, and/or autonomic systems, resulting in permanent pain, numbness, and/or weakness of one or several areas of the body; allergic reactions; (i.e.: anaphylactic reaction); and/or death. Furthermore, the patient was informed of those risks and complications associated with the medications. These include, but are not limited to: allergic reactions (i.e.:  anaphylactic or anaphylactoid reaction(s)); adrenal axis suppression; blood sugar elevation that in diabetics may result in ketoacidosis or comma; water retention that in patients with history of congestive heart failure may result in shortness of breath, pulmonary edema, and decompensation with resultant heart failure; weight gain; swelling or edema; medication-induced neural toxicity; particulate matter embolism and blood vessel occlusion with resultant organ, and/or nervous system infarction; and/or aseptic necrosis of one or more joints. Finally, the patient was informed that Medicine is not an exact science; therefore, there is also the possibility of unforeseen or unpredictable risks and/or possible complications that may result in a catastrophic outcome. The patient indicated having understood very clearly. We have given the patient no guarantees and we have made no promises. Enough time was given to the patient to ask questions, all of which were answered to the patient's satisfaction. Ms. Faller has indicated that she wanted to continue with the procedure. Attestation: I, the ordering provider, attest that I have discussed with the patient the benefits, risks, side-effects, alternatives, likelihood of achieving goals, and potential problems during recovery for the procedure that I have provided informed consent. Date  Time: 08/28/2019  8:19 AM  Pre-Procedure Preparation:  Monitoring: As per clinic protocol. Respiration, ETCO2, SpO2, BP, heart rate and rhythm monitor placed and checked for adequate function Safety Precautions: Patient was assessed for positional comfort and pressure points before starting the procedure. Time-out: I initiated and conducted the "Time-out" before starting the procedure, as per protocol. The patient was asked to participate by confirming the accuracy of the "Time Out" information. Verification of the correct person, site, and procedure were performed and  confirmed by me, the nursing staff, and the patient. "Time-out" conducted as per Joint Commission's Universal Protocol (UP.01.01.01). Time: 0928  Description of Procedure:          Laterality: Bilateral. The procedure was performed in identical fashion on both sides. Levels:  L2, L3, L4, L5, & S1 Medial Branch Level(s) Area Prepped: Posterior Lumbosacral Region DuraPrep (Iodine Povacrylex [0.7% available iodine] and Isopropyl Alcohol, 74% w/w) Safety Precautions: Aspiration looking for blood return was conducted prior to  all injections. At no point did we inject any substances, as a needle was being advanced. Before injecting, the patient was told to immediately notify me if she was experiencing any new onset of "ringing in the ears, or metallic taste in the mouth". No attempts were made at seeking any paresthesias. Safe injection practices and needle disposal techniques used. Medications properly checked for expiration dates. SDV (single dose vial) medications used. After the completion of the procedure, all disposable equipment used was discarded in the proper designated medical waste containers. Local Anesthesia: Protocol guidelines were followed. The patient was positioned over the fluoroscopy table. The area was prepped in the usual manner. The time-out was completed. The target area was identified using fluoroscopy. A 12-in long, straight, sterile hemostat was used with fluoroscopic guidance to locate the targets for each level blocked. Once located, the skin was marked with an approved surgical skin marker. Once all sites were marked, the skin (epidermis, dermis, and hypodermis), as well as deeper tissues (fat, connective tissue and muscle) were infiltrated with a small amount of a short-acting local anesthetic, loaded on a 10cc syringe with a 25G, 1.5-in  Needle. An appropriate amount of time was allowed for local anesthetics to take effect before proceeding to the next step. Local Anesthetic:  Lidocaine 2.0% The unused portion of the local anesthetic was discarded in the proper designated containers. Technical explanation of process:  L2 Medial Branch Nerve Block (MBB): The target area for the L2 medial branch is at the junction of the postero-lateral aspect of the superior articular process and the superior, posterior, and medial edge of the transverse process of L3. Under fluoroscopic guidance, a Quincke needle was inserted until contact was made with os over the superior postero-lateral aspect of the pedicular shadow (target area). After negative aspiration for blood, 0.5 mL of the nerve block solution was injected without difficulty or complication. The needle was removed intact. L3 Medial Branch Nerve Block (MBB): The target area for the L3 medial branch is at the junction of the postero-lateral aspect of the superior articular process and the superior, posterior, and medial edge of the transverse process of L4. Under fluoroscopic guidance, a Quincke needle was inserted until contact was made with os over the superior postero-lateral aspect of the pedicular shadow (target area). After negative aspiration for blood, 0.5 mL of the nerve block solution was injected without difficulty or complication. The needle was removed intact. L4 Medial Branch Nerve Block (MBB): The target area for the L4 medial branch is at the junction of the postero-lateral aspect of the superior articular process and the superior, posterior, and medial edge of the transverse process of L5. Under fluoroscopic guidance, a Quincke needle was inserted until contact was made with os over the superior postero-lateral aspect of the pedicular shadow (target area). After negative aspiration for blood, 0.5 mL of the nerve block solution was injected without difficulty or complication. The needle was removed intact. L5 Medial Branch Nerve Block (MBB): The target area for the L5 medial branch is at the junction of the postero-lateral  aspect of the superior articular process and the superior, posterior, and medial edge of the sacral ala. Under fluoroscopic guidance, a Quincke needle was inserted until contact was made with os over the superior postero-lateral aspect of the pedicular shadow (target area). After negative aspiration for blood, 0.5 mL of the nerve block solution was injected without difficulty or complication. The needle was removed intact. S1 Medial Branch Nerve Block (MBB): The target  area for the S1 medial branch is at the posterior and inferior 6 o'clock position of the L5-S1 facet joint. Under fluoroscopic guidance, the Quincke needle inserted for the L5 MBB was redirected until contact was made with os over the inferior and postero aspect of the sacrum, at the 6 o' clock position under the L5-S1 facet joint (Target area). After negative aspiration for blood, 0.5 mL of the nerve block solution was injected without difficulty or complication. The needle was removed intact.  Nerve block solution: 0.2% PF-Ropivacaine + Triamcinolone (40 mg/mL) diluted to a final concentration of 4 mg of Triamcinolone/mL of Ropivacaine The unused portion of the solution was discarded in the proper designated containers. Procedural Needles: 22-gauge, 3.5-inch, Quincke needles used for all levels.  Once the entire procedure was completed, the treated area was cleaned, making sure to leave some of the prepping solution back to take advantage of its long term bactericidal properties.   Illustration of the posterior view of the lumbar spine and the posterior neural structures. Laminae of L2 through S1 are labeled. DPRL5, dorsal primary ramus of L5; DPRS1, dorsal primary ramus of S1; DPR3, dorsal primary ramus of L3; FJ, facet (zygapophyseal) joint L3-L4; I, inferior articular process of L4; LB1, lateral branch of dorsal primary ramus of L1; IAB, inferior articular branches from L3 medial branch (supplies L4-L5 facet joint); IBP, intermediate  branch plexus; MB3, medial branch of dorsal primary ramus of L3; NR3, third lumbar nerve root; S, superior articular process of L5; SAB, superior articular branches from L4 (supplies L4-5 facet joint also); TP3, transverse process of L3.  Vitals:   08/28/19 0819 08/28/19 0920 08/28/19 0930 08/28/19 0940  BP: (!) 144/73 (!) 157/86 (!) 157/92 (!) 145/91  Pulse: 92 90 87 88  Resp: 18 (!) 22 16 16   Temp: 97.6 F (36.4 C)     TempSrc: Temporal     SpO2: 95% 98% 98% 99%  Weight: 226 lb (102.5 kg)     Height: 5\' 3"  (1.6 m)        Start Time: 0928 hrs. End Time: 0938 hrs.  Imaging Guidance (Spinal):          Type of Imaging Technique: Fluoroscopy Guidance (Spinal) Indication(s): Assistance in needle guidance and placement for procedures requiring needle placement in or near specific anatomical locations not easily accessible without such assistance. Exposure Time: Please see nurses notes. Contrast: None used. Fluoroscopic Guidance: I was personally present during the use of fluoroscopy. "Tunnel Vision Technique" used to obtain the best possible view of the target area. Parallax error corrected before commencing the procedure. "Direction-depth-direction" technique used to introduce the needle under continuous pulsed fluoroscopy. Once target was reached, antero-posterior, oblique, and lateral fluoroscopic projection used confirm needle placement in all planes. Images permanently stored in EMR. Interpretation: No contrast injected. I personally interpreted the imaging intraoperatively. Adequate needle placement confirmed in multiple planes. Permanent images saved into the patient's record.  Antibiotic Prophylaxis:   Anti-infectives (From admission, onward)   None     Indication(s): None identified  Post-operative Assessment:  Post-procedure Vital Signs:  Pulse/HCG Rate: 88  Temp: 97.6 F (36.4 C) Resp: 16 BP: (!) 145/91 SpO2: 99 %  EBL: None  Complications: No immediate  post-treatment complications observed by team, or reported by patient.  Note: The patient tolerated the entire procedure well. A repeat set of vitals were taken after the procedure and the patient was kept under observation following institutional policy, for this type of procedure. Post-procedural neurological assessment  was performed, showing return to baseline, prior to discharge. The patient was provided with post-procedure discharge instructions, including a section on how to identify potential problems. Should any problems arise concerning this procedure, the patient was given instructions to immediately contact us, at any time, without hesitation. In any case, we plan to contact the patient by telephone for a follow-up status report regarding this interventional procedure.  Comments:  No additional relevant information.  Plan of Care  Orders:  Orders Placed This Encounter  Procedures  . LUMBAR FACET(MEDIAL BRANCH NERVE BLOCK) MBNB    Scheduling Instructions:     Procedure: Lumbar facet block (AKA.: Lumbosacral medial branch nerve block)     Side: Bilateral     Level: L3-4, L4-5, & L5-S1 Facets (L2, L3, L4, L5, & S1 Medial Branch Nerves)     Sedation: Patient's choice.     Timeframe: Today    Order Specific Question:   Where will this procedure be performed?    Answer:   [name]  . DG PAIN [facility] C-ARM 1-60 MIN NO REPORT    Intraoperative interpretation by procedural physician at [place].    Standing Status:   Standing    Number of Occurrences:   1    Order Specific Question:   Reason for exam:    Answer:   Assistance in needle guidance and placement for procedures requiring needle placement in or near specific anatomical locations not easily accessible without such assistance.  . Informed Consent Details: Physician/Practitioner Attestation; Transcribe to consent form and obtain patient signature    Nursing Order: Transcribe to consent form and obtain  patient signature. Note: Always confirm laterality of pain with Ms. Fodge, before procedure. Procedure: Lumbar Facet Block  under fluoroscopic guidance Indication/Reason: Low Back Pain, with our without leg pain, due to Facet Joint Arthralgia (Joint Pain) known as Lumbar Facet Syndrome, secondary to Lumbar, and/or Lumbosacral Spondylosis (Arthritis of the Spine), without myelopathy or radiculopathy (Nerve Damage). Provider Attestation: I, [place] Dossie Arbour, MD, (Pain Management Specialist), the physician/practitioner, attest that I have discussed with the patient the benefits, risks, side effects, alternatives, likelihood of achieving goals and potential problems during recovery for the procedure that I have provided informed consent.  . Care order/instruction: Please confirm that the patient has stopped the Coumadin (Warfarin) X 5 days prior to procedure or surgery.    Please confirm that the patient has stopped the Coumadin (Warfarin) X 5 days prior to procedure or surgery.    Standing Status:   Standing    Number of Occurrences:   1  . Provide equipment / supplies at bedside    Equipment required: Single use, disposable, "Block Tray"    Standing Status:   Standing    Number of Occurrences:   1    Order Specific Question:   Specify    Answer:   Block Tray  . Bleeding precautions    Standing Status:   Standing    Number of Occurrences:   1  . Latex precautions    Activate Latex-Free Protocol.    Standing Status:   Standing    Number of Occurrences:   1   Chronic Opioid Analgesic:  Hydrocodone/APAP 5/325, 1 tab PO QD PRN (5 mg/day of hydrocodone) MME: 5 mg/day.   Medications ordered for procedure: Meds ordered this encounter  Medications  . lidocaine (XYLOCAINE) 2 % (with pres) injection 400 mg  . ropivacaine (PF) 2 mg/mL (0.2%) (NAROPIN) injection 18 mL  . triamcinolone  acetonide (KENALOG-40) injection 80 mg   Medications administered: We administered lidocaine,  ropivacaine (PF) 2 mg/mL (0.2%), and triamcinolone acetonide.  See the medical record for exact dosing, route, and time of administration.  Follow-up plan:   Return in about 2 weeks (around 09/11/2019) for (VV), (PP).       Interventional management options:  Considering:   NOTE:COUMADINANTICOAGULATION(Stop:5days  Re-start: 2hrs) NO RFAuntil BMIis <35. Diagnostic L5-S1 LESI Diagnostic bilateral L3TFESI Diagnostic bilateral L4TFESI Diagnostic bilateral L5 TFESI Diagnostic left SI joint block #1  Possible righ SI joint RFA Possible bilateral lumbar facet RFA(NO RFAuntil BMIis <35.) Diagnostic right IA hip joint injection Diagnostic right femoral +ObturatorNB Possible right femoral + obturator nerve RFA   Palliative PRN treatment(s):   Palliative/Therapeutic left L4-5 LESI #2 Palliative left lumbar facet block#6 Palliativeright lumbar facet block(s) #10 Palliative right SI joint block #4(not as effective as facet blocks) Diagnostic left SI joint block #2 (not as effective in controlling the low back pain as the facet injections) Palliative/Therapeutic (Midline) caudalESI#4     Recent Visits Date Type Provider Dept  08/16/19 Telemedicine Milinda Pointer, [facility]  07/25/19 Telemedicine Milinda Pointer, MD [name]  07/10/19 Procedure visit Milinda Pointer, MD [name]  06/06/19 Telemedicine Milinda Pointer, MD [name]  Showing recent visits within past 90 days and meeting all other requirements   Today's Visits Date Type Provider Dept  08/28/19 Procedure visit Milinda Pointer, MD [name]  Showing today's visits and meeting all other requirements   Future Appointments Date Type Provider Dept  09/19/19 Appointment Milinda Pointer, MD [name]  Showing future appointments within next 90 days and meeting all other requirements   Disposition:  Discharge home  Discharge (Date  Time): 08/28/2019; 0941 hrs.   Primary Care Physician: Tracie Harrier, MD Location: [facility] Note by: Gaspar Cola, MD Date: 08/28/2019; Time: 10:44 AM  Disclaimer:  Medicine is not an Chief Strategy Officer. The only guarantee in medicine is that nothing is guaranteed. It is important to note that the decision to proceed with this intervention was based on the information collected from the patient. The Data and conclusions were drawn from the patient's questionnaire, the interview, and the physical examination. Because the information was provided in large part by the patient, it cannot be guaranteed that it has not been purposely or unconsciously manipulated. Every effort has been made to obtain as much relevant data as possible for this evaluation. It is important to note that the conclusions that lead to this procedure are derived in large part from the available data. Always take into account that the treatment will also be dependent on availability of resources and existing treatment guidelines, considered by other Pain Management Practitioners as being common knowledge and practice, at the time of the intervention. For Medico-Legal purposes, it is also important to point out that variation in procedural techniques and pharmacological choices are the acceptable norm. The indications, contraindications, technique, and results of the above procedure should only be interpreted and judged by a Board-Certified Interventional Pain Specialist with extensive familiarity and expertise in the same exact procedure and technique.

## 2019-08-28 ENCOUNTER — Other Ambulatory Visit: Payer: Self-pay

## 2019-08-28 ENCOUNTER — Ambulatory Visit
Admission: RE | Admit: 2019-08-28 | Discharge: 2019-08-28 | Disposition: A | Discharge status: Home / Self Care | Payer: PPO | Source: Ambulatory Visit | Attending: Pain Medicine | Admitting: Pain Medicine

## 2019-08-28 ENCOUNTER — Ambulatory Visit (HOSPITAL_BASED_OUTPATIENT_CLINIC_OR_DEPARTMENT_OTHER): Payer: PPO | Admitting: Pain Medicine

## 2019-08-28 ENCOUNTER — Encounter: Payer: Self-pay | Admitting: Pain Medicine

## 2019-08-28 VITALS — BP 145/91 | HR 88 | Temp 97.6°F | Resp 16 | Ht 63.0 in | Wt 226.0 lb

## 2019-08-28 DIAGNOSIS — Z7901 Long term (current) use of anticoagulants: Secondary | ICD-10-CM

## 2019-08-28 DIAGNOSIS — M4306 Spondylolysis, lumbar region: Secondary | ICD-10-CM | POA: Diagnosis not present

## 2019-08-28 DIAGNOSIS — M5137 Other intervertebral disc degeneration, lumbosacral region: Secondary | ICD-10-CM | POA: Diagnosis not present

## 2019-08-28 DIAGNOSIS — M4310 Spondylolisthesis, site unspecified: Secondary | ICD-10-CM

## 2019-08-28 DIAGNOSIS — G8929 Other chronic pain: Secondary | ICD-10-CM

## 2019-08-28 DIAGNOSIS — M545 Low back pain: Secondary | ICD-10-CM | POA: Diagnosis not present

## 2019-08-28 DIAGNOSIS — M47817 Spondylosis without myelopathy or radiculopathy, lumbosacral region: Secondary | ICD-10-CM | POA: Diagnosis not present

## 2019-08-28 DIAGNOSIS — M47816 Spondylosis without myelopathy or radiculopathy, lumbar region: Secondary | ICD-10-CM

## 2019-08-28 DIAGNOSIS — Z91040 Latex allergy status: Secondary | ICD-10-CM

## 2019-08-28 MED ORDER — LIDOCAINE HCL 2 % IJ SOLN
20.0000 mL | Freq: Once | INTRAMUSCULAR | Status: AC
  Administered 2019-08-28: 09:00:00 400 mg
  Filled 2019-08-28: qty 20

## 2019-08-28 MED ORDER — TRIAMCINOLONE ACETONIDE 40 MG/ML IJ SUSP
80.0000 mg | Freq: Once | INTRAMUSCULAR | Status: AC
  Administered 2019-08-28: 80 mg
  Filled 2019-08-28: qty 2

## 2019-08-28 MED ORDER — ROPIVACAINE HCL 2 MG/ML IJ SOLN
18.0000 mL | Freq: Once | INTRAMUSCULAR | Status: AC
  Administered 2019-08-28: 09:00:00 18 mL via PERINEURAL
  Filled 2019-08-28: qty 20

## 2019-08-28 NOTE — Progress Notes (Signed)
Safety precautions to be maintained throughout the outpatient stay will include: orient to surroundings, keep bed in low position, maintain call bell within reach at all times, provide assistance with transfer out of bed and ambulation.  

## 2019-08-29 ENCOUNTER — Telehealth: Payer: Self-pay | Admitting: *Deleted

## 2019-08-29 NOTE — Telephone Encounter (Signed)
Attempted to call for post procedure follow-up. Message left. 

## 2019-09-12 ENCOUNTER — Ambulatory Visit: Payer: PPO | Attending: Internal Medicine

## 2019-09-12 DIAGNOSIS — Z23 Encounter for immunization: Secondary | ICD-10-CM

## 2019-09-12 NOTE — Progress Notes (Signed)
   Covid-19 Vaccination Clinic  Name:  Kara [name]    MRN: [id number] DOB: 1950/12/13  09/12/2019  [name] was observed post Covid-19 immunization for 15 minutes without incident. She was provided with Vaccine Information Sheet and instruction to access the V-Safe system.   [name] was instructed to call 911 with any severe reactions post vaccine: Marland Kitchen Difficulty breathing  . Swelling of face and throat  . A fast heartbeat  . A bad rash all over body  . Dizziness and weakness   Immunizations Administered    Name Date Dose VIS Date Route   Pfizer COVID-19 Vaccine 09/12/2019  3:01 PM 0.3 mL 07/18/2018 Intramuscular   Manufacturer: [employer], [employer]   Lot: [id number]   [place]: [id number]

## 2019-09-18 NOTE — Progress Notes (Signed)
Patient: Kara [name]  Service Category: E/M  Provider: Gaspar Cola, MD  DOB: 1950/07/17  DOS: 09/19/2019  Location: Office  MRN: [id number]  Setting: Ambulatory outpatient  Referring Provider: Tracie Harrier, MD  Type: Established Patient  Specialty: Interventional Pain Management  PCP: Tracie Harrier, MD  Location: Remote location  Delivery: TeleHealth     Virtual Encounter - Pain Management PROVIDER NOTE: Information contained herein reflects review and annotations entered in association with encounter. Interpretation of such information and data should be left to medically-trained personnel. Information provided to patient can be located elsewhere in the medical record under "Patient Instructions". Document created using STT-dictation technology, any transcriptional errors that may result from process are unintentional.    Contact & Pharmacy Preferred: [phone] Home: [phone] (home) Mobile: [phone] (mobile) E-mail: [email or web address]  [place], [place] [address] Phone: [phone] Fax: [phone]   Pre-screening  Ms. Ganger offered "in-person" vs "virtual" encounter. She indicated preferring virtual for this encounter.   Reason COVID-19*  Social distancing based on CDC and AMA recommendations.   I contacted [name] on 09/19/2019 via telephone.      I clearly identified myself as Gaspar Cola, MD. I verified that I was speaking with the correct person using two identifiers (Name: TONJA JEZEWSKI, and date of birth: 07-16-1950).  Consent I sought verbal advanced consent from [name] for virtual visit interactions. I informed Ms. Mcfall of possible security and privacy concerns, risks, and limitations associated with providing "not-in-person" medical evaluation and management services. I also informed Ms. Dusenbury of the  availability of "in-person" appointments. Finally, I informed her that there would be a charge for the virtual visit and that she could be  personally, fully or partially, financially responsible for it. Ms. Phillis expressed understanding and agreed to proceed.   Historic Elements   Ms. ANAISABEL PEDERSON is a 69 y.o. year old, female patient evaluated today after her last contact with our practice on 08/29/2019. Ms. 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 She also  has a past surgical history that includes Knee surgery; Carpal tunnel release (Bilateral); Colon resection sigmoid (N/A, 11/02/2016); Colostomy (N/A, 11/02/2016); Sigmoidoscopy (N/A, 11/13/2016); PULMONARY VENOGRAPHY (N/A, 11/19/2016); Colonoscopy with propofol (N/A, 02/03/2017); Colon surgery (11/02/2016); IVC FILTER INSERTION (Right, 10/2016); Colostomy reversal (N/A, 02/24/2017); Appendectomy (N/A, 02/24/2017); Lysis of adhesion (N/A, 02/24/2017); laparoscopy (N/A, 02/24/2017); Ileo loop diversion (N/A, 02/24/2017); Sigmoidoscopy (N/A, 05/19/2017); Ileostomy closure (N/A, 06/01/2017); IVC FILTER REMOVAL (N/A, 08/09/2017); Sacroplasty (N/A, 11/08/2017); Cataract extraction w/PHACO (Left, 11/21/2018); and Cataract extraction w/PHACO (Right, 12/12/2018). Ms. Maeda has a current medication list which includes the following prescription(s): aspirin ec, calcium carbonate, calcium polycarbophil, candesartan, vitamin d3, cyclobenzaprine, dulaglutide, gabapentin, glipizide, [START ON 10/06/2019] hydrocodone-acetaminophen, [START ON 11/05/2019] hydrocodone-acetaminophen, [START ON 12/05/2019] hydrocodone-acetaminophen, loperamide, meclizine, multi-vitamins, nystatin cream,  ropinirole, vitamin b-12, warfarin, and warfarin. She  reports that she has never smoked. She has never used smokeless tobacco. She reports that she does not drink alcohol or use drugs. Ms. Guadiana is allergic to adhesive [tape]; other; latex; and morphine and related.   HPI  Today, she is being contacted for a post-procedure assessment.  The patient indicates doing well with the current medication regimen. No adverse reactions or side  effects reported to the medications.  The patient seems to have done well with this last lumbar facet block and we hope that they will continue to provide her with good benefit for long period of time.  Unfortunately, I do know that she has other issues that are continuing to aggravate her back pain.  Post-Procedure Evaluation  Procedure: Bilateral bilateral lumbar facet block under fluoroscopic guidance, no sedation Pre-procedure pain level: 10/10 Post-procedure: 0/10 (100% relief)  Sedation: None.  Effectiveness during initial hour after procedure(Ultra-Short Term Relief): 100 %.  Local anesthetic used: Long-acting (4-6 hours) Effectiveness: Defined as any analgesic benefit obtained secondary to the administration of local anesthetics. This carries significant diagnostic value as to the etiological location, or anatomical origin, of the pain. Duration of benefit is expected to coincide with the duration of the local anesthetic used.  Effectiveness during initial 4-6 hours after procedure(Short-Term Relief): 100 %.  Long-term benefit: Defined as any relief past the pharmacologic duration of the local anesthetics.  Effectiveness past the initial 6 hours after procedure(Long-Term Relief): 75 %.  Current benefits: Defined as benefit that persist at this time.   Analgesia:  75% improved Function: Ms. Broshears reports improvement in function ROM: Ms. Poche reports improvement in ROM  Pharmacotherapy Assessment  Analgesic:  Hydrocodone/APAP 5/325, 1 tab PO QD PRN (5 mg/day of hydrocodone) MME: 5 mg/day.   Monitoring: [place] PMP: PDMP reviewed during this encounter.       Pharmacotherapy: No side-effects or adverse reactions reported. Compliance: No problems identified. Effectiveness: Clinically acceptable. Plan: Refer to "POC".  UDS: No results found for: SUMMARY   Laboratory Chemistry Profile   Renal Lab Results  Component Value Date   BUN 10 07/09/2019   CREATININE 0.63 07/09/2019   GFRAA >60 07/09/2019   GFRNONAA >60 07/09/2019     Hepatic Lab Results  Component Value Date   AST 38 07/09/2019   ALT 43 07/09/2019   ALBUMIN 3.6 07/09/2019   ALKPHOS 51 07/09/2019   LIPASE 23 07/09/2019     Electrolytes Lab Results  Component Value Date   NA 139 07/09/2019   K 3.7 07/09/2019   CL 110 07/09/2019   CALCIUM 8.5 (L) 07/09/2019   MG 2.0 11/02/2017   PHOS 3.3 11/07/2016     Bone Lab Results  Component Value Date   25OHVITD1 25 (L) 11/02/2017   25OHVITD2 <1.0 11/02/2017   25OHVITD3 25 11/02/2017     Inflammation (CRP: Acute Phase) (ESR: Chronic Phase) Lab Results  Component Value Date   CRP 5.3 (H) 11/02/2017   ESRSEDRATE 12 11/02/2017       Note: Above Lab results reviewed.  Imaging  DG PAIN CLINIC C-ARM 1-60 MIN NO REPORT Fluoro was used, but no Radiologist interpretation will be provided.  Please refer to "NOTES" tab for provider progress note.  Assessment  The primary encounter diagnosis was Chronic pain syndrome. Diagnoses of Chronic low back pain (1ry area of Pain) (Bilateral) w/o sciatica, Lumbar facet syndrome (Bilateral), and Pharmacologic therapy were also pertinent to this visit.  Plan of Care  Problem-specific:  No problem-specific Assessment & Plan notes found for this encounter.  Ms. DONIKA BUTNER has a current medication list which includes the following long-term medication(s): calcium carbonate, candesartan, glipizide, [START ON 10/06/2019]  hydrocodone-acetaminophen, [START ON 11/05/2019] hydrocodone-acetaminophen, [START ON 12/05/2019] hydrocodone-acetaminophen, ropinirole, warfarin, and warfarin.  Pharmacotherapy (Medications Ordered): Meds ordered this encounter  Medications  . HYDROcodone-acetaminophen (NORCO/VICODIN) 5-325 MG tablet    Sig: Take 1 tablet by mouth  every 8 (eight) hours as needed for severe pain. Must last 30 days.    Dispense:  20 tablet    Refill:  0    Chronic Pain: STOP Act (Not applicable) Fill 1 day early if closed on refill date. Do not fill until: 12/05/2019. To last until: 01/04/2020. Avoid benzodiazepines within 8 hours of opioids   Orders:  Orders Placed This Encounter  Procedures  . ToxASSURE Select 13 (MW), Urine    Volume: 30 ml(s). Minimum 3 ml of urine is needed. Document temperature of fresh sample. Indications: Long term (current) use of opiate analgesic (F07.225)    Order Specific Question:   Release to patient    Answer:   Immediate   Follow-up plan:   Return in about 15 weeks (around 01/02/2020) for (F2F), (MM).      Interventional management options:  Considering:   NOTE:COUMADINANTICOAGULATION(Stop:5days  Re-start: 2hrs) NO RFAuntil BMIis <35. Diagnostic L5-S1 LESI Diagnostic bilateral L3TFESI Diagnostic bilateral L4TFESI Diagnostic bilateral L5 TFESI Diagnostic left SI joint block #1  Possible righ SI joint RFA Possible bilateral lumbar facet RFA(NO RFAuntil BMIis <35.) Diagnostic right IA hip joint injection Diagnostic right femoral +ObturatorNB Possible right femoral + obturator nerve RFA   Palliative PRN treatment(s):   Palliative/Therapeutic left L4-5 LESI #2 Palliative left lumbar facet block#6 Palliativeright lumbar facet block(s) #10 Palliative right SI joint block #4(not as effective as facet blocks) Diagnostic left SI joint block #2 (not as effective in controlling the low back pain as the facet injections) Palliative/Therapeutic  (Midline) caudalESI#4      Recent Visits Date Type Provider Dept  08/28/19 Procedure visit Milinda Pointer, MD [name]  08/16/19 [place] [place]  07/25/19 Telemedicine Milinda Pointer, MD [name]  07/10/19 Procedure visit Milinda Pointer, MD [name]  Showing recent visits within past 90 days and meeting all other requirements   Future Appointments Date Type Provider Dept  12/03/19 Appointment Milinda Pointer, MD [name]  Showing future appointments within next 90 days and meeting all other requirements   I discussed the assessment and treatment plan with the patient. The patient was provided an opportunity to ask questions and all were answered. The patient agreed with the plan and demonstrated an understanding of the instructions.  Patient advised to call back or seek an in-person evaluation if the symptoms or condition worsens.  Duration of encounter: 12 minutes.  Note by: Gaspar Cola, MD Date: 09/19/2019; Time: 11:36 AM

## 2019-09-19 ENCOUNTER — Ambulatory Visit: Payer: PPO | Attending: Pain Medicine | Admitting: Pain Medicine

## 2019-09-19 ENCOUNTER — Other Ambulatory Visit: Payer: Self-pay

## 2019-09-19 ENCOUNTER — Telehealth: Payer: Self-pay | Admitting: *Deleted

## 2019-09-19 DIAGNOSIS — M545 Low back pain, unspecified: Secondary | ICD-10-CM

## 2019-09-19 DIAGNOSIS — G894 Chronic pain syndrome: Secondary | ICD-10-CM

## 2019-09-19 DIAGNOSIS — Z79899 Other long term (current) drug therapy: Secondary | ICD-10-CM | POA: Diagnosis not present

## 2019-09-19 DIAGNOSIS — G8929 Other chronic pain: Secondary | ICD-10-CM

## 2019-09-19 DIAGNOSIS — M47816 Spondylosis without myelopathy or radiculopathy, lumbar region: Secondary | ICD-10-CM

## 2019-09-19 MED ORDER — HYDROCODONE-ACETAMINOPHEN 5-325 MG PO TABS: 1.0000 | ORAL_TABLET | Freq: Three times a day (TID) | ORAL | 0 refills | Status: DC | PRN

## 2019-09-19 NOTE — Telephone Encounter (Signed)
Sw pt made her aware of the d/t time slots to complete her UDS.                                 Thanks

## 2019-09-20 ENCOUNTER — Other Ambulatory Visit: Payer: Self-pay | Admitting: Internal Medicine

## 2019-09-20 DIAGNOSIS — Z79899 Other long term (current) drug therapy: Secondary | ICD-10-CM | POA: Diagnosis not present

## 2019-09-20 DIAGNOSIS — G894 Chronic pain syndrome: Secondary | ICD-10-CM | POA: Diagnosis not present

## 2019-09-20 DIAGNOSIS — Z1231 Encounter for screening mammogram for malignant neoplasm of breast: Secondary | ICD-10-CM

## 2019-09-23 LAB — TOXASSURE SELECT 13 (MW), URINE

## 2019-09-27 ENCOUNTER — Telehealth: Payer: Self-pay | Admitting: *Deleted

## 2019-09-27 NOTE — Telephone Encounter (Signed)
There is a standing order for lumbar facet block.

## 2019-10-01 DIAGNOSIS — R791 Abnormal coagulation profile: Secondary | ICD-10-CM | POA: Diagnosis not present

## 2019-10-16 ENCOUNTER — Other Ambulatory Visit: Payer: Self-pay

## 2019-10-16 ENCOUNTER — Ambulatory Visit
Admission: RE | Admit: 2019-10-16 | Discharge: 2019-10-16 | Disposition: A | Discharge status: Home / Self Care | Payer: PPO | Source: Ambulatory Visit | Attending: Pain Medicine | Admitting: Pain Medicine

## 2019-10-16 ENCOUNTER — Encounter: Payer: Self-pay | Admitting: Pain Medicine

## 2019-10-16 ENCOUNTER — Ambulatory Visit (HOSPITAL_BASED_OUTPATIENT_CLINIC_OR_DEPARTMENT_OTHER): Payer: PPO | Admitting: Pain Medicine

## 2019-10-16 VITALS — BP 137/92 | HR 88 | Temp 97.8°F | Resp 20 | Ht 63.0 in | Wt 221.0 lb

## 2019-10-16 DIAGNOSIS — Z7901 Long term (current) use of anticoagulants: Secondary | ICD-10-CM | POA: Diagnosis not present

## 2019-10-16 DIAGNOSIS — M47816 Spondylosis without myelopathy or radiculopathy, lumbar region: Secondary | ICD-10-CM | POA: Diagnosis not present

## 2019-10-16 DIAGNOSIS — Z91040 Latex allergy status: Secondary | ICD-10-CM

## 2019-10-16 DIAGNOSIS — M5137 Other intervertebral disc degeneration, lumbosacral region: Secondary | ICD-10-CM

## 2019-10-16 DIAGNOSIS — G8929 Other chronic pain: Secondary | ICD-10-CM | POA: Diagnosis not present

## 2019-10-16 DIAGNOSIS — M47817 Spondylosis without myelopathy or radiculopathy, lumbosacral region: Secondary | ICD-10-CM

## 2019-10-16 DIAGNOSIS — M545 Low back pain: Secondary | ICD-10-CM | POA: Diagnosis not present

## 2019-10-16 DIAGNOSIS — M51379 Other intervertebral disc degeneration, lumbosacral region without mention of lumbar back pain or lower extremity pain: Secondary | ICD-10-CM

## 2019-10-16 MED ORDER — ORPHENADRINE CITRATE 30 MG/ML IJ SOLN
INTRAMUSCULAR | Status: AC
  Filled 2019-10-16: qty 2

## 2019-10-16 MED ORDER — KETOROLAC TROMETHAMINE 60 MG/2ML IM SOLN
INTRAMUSCULAR | Status: AC
  Filled 2019-10-16: qty 2

## 2019-10-16 MED ORDER — ORPHENADRINE CITRATE 30 MG/ML IJ SOLN
60.0000 mg | Freq: Once | INTRAMUSCULAR | Status: AC
  Administered 2019-10-16: 60 mg via INTRAMUSCULAR

## 2019-10-16 MED ORDER — ROPIVACAINE HCL 2 MG/ML IJ SOLN
18.0000 mL | Freq: Once | INTRAMUSCULAR | Status: AC
  Administered 2019-10-16: 18 mL via PERINEURAL
  Filled 2019-10-16: qty 20

## 2019-10-16 MED ORDER — LIDOCAINE HCL 2 % IJ SOLN
20.0000 mL | Freq: Once | INTRAMUSCULAR | Status: AC
  Administered 2019-10-16: 400 mg
  Filled 2019-10-16: qty 40

## 2019-10-16 MED ORDER — KETOROLAC TROMETHAMINE 60 MG/2ML IM SOLN
60.0000 mg | Freq: Once | INTRAMUSCULAR | Status: AC
  Administered 2019-10-16: 60 mg via INTRAMUSCULAR

## 2019-10-16 MED ORDER — TRIAMCINOLONE ACETONIDE 40 MG/ML IJ SUSP
80.0000 mg | Freq: Once | INTRAMUSCULAR | Status: AC
  Administered 2019-10-16: 80 mg
  Filled 2019-10-16: qty 2

## 2019-10-16 NOTE — Progress Notes (Signed)
Safety precautions to be maintained throughout the outpatient stay will include: orient to surroundings, keep bed in low position, maintain call bell within reach at all times, provide assistance with transfer out of bed and ambulation.  

## 2019-10-16 NOTE — Progress Notes (Signed)
PROVIDER NOTE: Information contained herein reflects review and annotations entered in association with encounter. Interpretation of such information and data should be left to medically-trained personnel. Information provided to patient can be located elsewhere in the medical record under "Patient Instructions". Document created using STT-dictation technology, any transcriptional errors that may result from process are unintentional.    Patient: Kara [name]  Service Category: Procedure  Provider: Gaspar Cola, MD  DOB: 1950-06-10  DOS: 10/16/2019  Location: [facility]  MRN: [id number]  Setting: Ambulatory - outpatient  Referring Provider: Tracie Harrier, MD  Type: Established Patient  Specialty: Interventional Pain Management  PCP: Tracie Harrier, MD   Primary Reason for Visit: Interventional Pain Management Treatment. CC: Back Pain (low)  Procedure:          Anesthesia, Analgesia, Anxiolysis:  Type: Lumbar Facet, Medial Branch Block(s)          Primary Purpose: Palliative Region: Posterolateral Lumbosacral Spine Level: L2, L3, L4, L5, & S1 Medial Branch Level(s). Injecting these levels blocks the L3-4, L4-5, and L5-S1 lumbar facet joints. Laterality: Bilateral  Type: Local Anesthesia Indication(s): Analgesia         Route: Infiltration ([place]/IM) IV Access: Declined Sedation: Declined  Local Anesthetic: Lidocaine 1-2%  Position: Prone   Indications: 1. Lumbar facet syndrome (Bilateral)   2. Spondylosis without myelopathy or radiculopathy, lumbosacral region   3. Lumbar facet arthropathy (L3-4, L4-5, and L5-S1) (Bilateral)   4. DDD (degenerative disc disease), lumbosacral   5. Chronic low back pain (1ry area of Pain) (Bilateral) w/o sciatica   6. Chronic anticoagulation (COUMADIN)   7. History of allergy to latex    Pain Score: Pre-procedure: 8 /10 Post-procedure: 6 /10 (no sedation)  Pre-op Assessment:  Kara [name] is a 69 y.o.  (year old), female patient, seen today for interventional treatment. She  has a past surgical history that includes Knee surgery; Carpal tunnel release (Bilateral); Colon resection sigmoid (N/A, 11/02/2016); Colostomy (N/A, 11/02/2016); Sigmoidoscopy (N/A, 11/13/2016); PULMONARY VENOGRAPHY (N/A, 11/19/2016); Colonoscopy with propofol (N/A, 02/03/2017); Colon surgery (11/02/2016); IVC FILTER INSERTION (Right, 10/2016); Colostomy reversal (N/A, 02/24/2017); Appendectomy (N/A, 02/24/2017); Lysis of adhesion (N/A, 02/24/2017); laparoscopy (N/A, 02/24/2017); Ileo loop diversion (N/A, 02/24/2017); Sigmoidoscopy (N/A, 05/19/2017); Ileostomy closure (N/A, 06/01/2017); IVC FILTER REMOVAL (N/A, 08/09/2017); Sacroplasty (N/A, 11/08/2017); Cataract extraction w/PHACO (Left, 11/21/2018); and Cataract extraction w/PHACO (Right, 12/12/2018). Kara [name] has a current medication list which includes the following prescription(s): aspirin ec, calcium carbonate, calcium polycarbophil, candesartan, vitamin d3, cyclobenzaprine, gabapentin, glipizide, hydrocodone-acetaminophen, [START ON 11/05/2019] hydrocodone-acetaminophen, [START ON 12/05/2019] hydrocodone-acetaminophen, meclizine, multi-vitamins, nystatin cream, ropinirole, vitamin b-12, warfarin, warfarin, dulaglutide, and loperamide, and the following Facility-Administered Medications: ketorolac and orphenadrine. Her primarily concern today is the Back Pain (low)  Initial Vital Signs:  Pulse/HCG Rate: 88ECG Heart Rate: 85 Temp: 97.8 F (36.6 C) Resp: 18 BP: 121/62 SpO2: 95 %  BMI: Estimated body mass index is 39.15 kg/m as calculated from the following:   Height as of this encounter: 5\' 3"  (1.6 m).   Weight as of this encounter: 221 lb (100.2 kg).  Risk Assessment: Allergies: Reviewed. She is allergic to adhesive [tape]; other; latex; and morphine and related.  Allergy Precautions: None required Coagulopathies: Reviewed. None identified.  Blood-thinner therapy: None at  this time Active Infection(s): Reviewed. None identified. Kara [name] is afebrile  Site Confirmation: Kara [name] was asked to confirm the procedure and laterality before marking the site Procedure checklist: Completed Consent: Before the procedure and under the influence of no sedative(s),  amnesic(s), or anxiolytics, the patient was informed of the treatment options, risks and possible complications. To fulfill our ethical and legal obligations, as recommended by the American Medical Association's Code of Ethics, I have informed the patient of my clinical impression; the nature and purpose of the treatment or procedure; the risks, benefits, and possible complications of the intervention; the alternatives, including doing nothing; the risk(s) and benefit(s) of the alternative treatment(s) or procedure(s); and the risk(s) and benefit(s) of doing nothing. The patient was provided information about the general risks and possible complications associated with the procedure. These may include, but are not limited to: failure to achieve desired goals, infection, bleeding, organ or nerve damage, allergic reactions, paralysis, and death. In addition, the patient was informed of those risks and complications associated to Spine-related procedures, such as failure to decrease pain; infection (i.e.: Meningitis, epidural or intraspinal abscess); bleeding (i.e.: epidural hematoma, subarachnoid hemorrhage, or any other type of intraspinal or peri-dural bleeding); organ or nerve damage (i.e.: Any type of peripheral nerve, nerve root, or spinal cord injury) with subsequent damage to sensory, motor, and/or autonomic systems, resulting in permanent pain, numbness, and/or weakness of one or several areas of the body; allergic reactions; (i.e.: anaphylactic reaction); and/or death. Furthermore, the patient was informed of those risks and complications associated with the medications. These include, but are not  limited to: allergic reactions (i.e.: anaphylactic or anaphylactoid reaction(s)); adrenal axis suppression; blood sugar elevation that in diabetics may result in ketoacidosis or comma; water retention that in patients with history of congestive heart failure may result in shortness of breath, pulmonary edema, and decompensation with resultant heart failure; weight gain; swelling or edema; medication-induced neural toxicity; particulate matter embolism and blood vessel occlusion with resultant organ, and/or nervous system infarction; and/or aseptic necrosis of one or more joints. Finally, the patient was informed that Medicine is not an exact science; therefore, there is also the possibility of unforeseen or unpredictable risks and/or possible complications that may result in a catastrophic outcome. The patient indicated having understood very clearly. We have given the patient no guarantees and we have made no promises. Enough time was given to the patient to ask questions, all of which were answered to the patient's satisfaction. Ms. Gallacher has indicated that she wanted to continue with the procedure. Attestation: I, the ordering provider, attest that I have discussed with the patient the benefits, risks, side-effects, alternatives, likelihood of achieving goals, and potential problems during recovery for the procedure that I have provided informed consent. Date  Time: 10/16/2019  8:18 AM  Pre-Procedure Preparation:  Monitoring: As per clinic protocol. Respiration, ETCO2, SpO2, BP, heart rate and rhythm monitor placed and checked for adequate function Safety Precautions: Patient was assessed for positional comfort and pressure points before starting the procedure. Time-out: I initiated and conducted the "Time-out" before starting the procedure, as per protocol. The patient was asked to participate by confirming the accuracy of the "Time Out" information. Verification of the correct person, site,  and procedure were performed and confirmed by me, the nursing staff, and the patient. "Time-out" conducted as per Joint Commission's Universal Protocol (UP.01.01.01). Time: 8295  Description of Procedure:          Laterality: Bilateral. The procedure was performed in identical fashion on both sides. Levels:  L2, L3, L4, L5, & S1 Medial Branch Level(s) Area Prepped: Posterior Lumbosacral Region DuraPrep (Iodine Povacrylex [0.7% available iodine] and Isopropyl Alcohol, 74% w/w) Safety Precautions: Aspiration looking for blood return was conducted prior  to all injections. At no point did we inject any substances, as a needle was being advanced. Before injecting, the patient was told to immediately notify me if she was experiencing any new onset of "ringing in the ears, or metallic taste in the mouth". No attempts were made at seeking any paresthesias. Safe injection practices and needle disposal techniques used. Medications properly checked for expiration dates. SDV (single dose vial) medications used. After the completion of the procedure, all disposable equipment used was discarded in the proper designated medical waste containers. Local Anesthesia: Protocol guidelines were followed. The patient was positioned over the fluoroscopy table. The area was prepped in the usual manner. The time-out was completed. The target area was identified using fluoroscopy. A 12-in long, straight, sterile hemostat was used with fluoroscopic guidance to locate the targets for each level blocked. Once located, the skin was marked with an approved surgical skin marker. Once all sites were marked, the skin (epidermis, dermis, and hypodermis), as well as deeper tissues (fat, connective tissue and muscle) were infiltrated with a small amount of a short-acting local anesthetic, loaded on a 10cc syringe with a 25G, 1.5-in  Needle. An appropriate amount of time was allowed for local anesthetics to take effect before proceeding to the  next step. Local Anesthetic: Lidocaine 2.0% The unused portion of the local anesthetic was discarded in the proper designated containers. Technical explanation of process:  L2 Medial Branch Nerve Block (MBB): The target area for the L2 medial branch is at the junction of the postero-lateral aspect of the superior articular process and the superior, posterior, and medial edge of the transverse process of L3. Under fluoroscopic guidance, a Quincke needle was inserted until contact was made with os over the superior postero-lateral aspect of the pedicular shadow (target area). After negative aspiration for blood, 0.5 mL of the nerve block solution was injected without difficulty or complication. The needle was removed intact. L3 Medial Branch Nerve Block (MBB): The target area for the L3 medial branch is at the junction of the postero-lateral aspect of the superior articular process and the superior, posterior, and medial edge of the transverse process of L4. Under fluoroscopic guidance, a Quincke needle was inserted until contact was made with os over the superior postero-lateral aspect of the pedicular shadow (target area). After negative aspiration for blood, 0.5 mL of the nerve block solution was injected without difficulty or complication. The needle was removed intact. L4 Medial Branch Nerve Block (MBB): The target area for the L4 medial branch is at the junction of the postero-lateral aspect of the superior articular process and the superior, posterior, and medial edge of the transverse process of L5. Under fluoroscopic guidance, a Quincke needle was inserted until contact was made with os over the superior postero-lateral aspect of the pedicular shadow (target area). After negative aspiration for blood, 0.5 mL of the nerve block solution was injected without difficulty or complication. The needle was removed intact. L5 Medial Branch Nerve Block (MBB): The target area for the L5 medial branch is at the  junction of the postero-lateral aspect of the superior articular process and the superior, posterior, and medial edge of the sacral ala. Under fluoroscopic guidance, a Quincke needle was inserted until contact was made with os over the superior postero-lateral aspect of the pedicular shadow (target area). After negative aspiration for blood, 0.5 mL of the nerve block solution was injected without difficulty or complication. The needle was removed intact. S1 Medial Branch Nerve Block (MBB): The  target area for the S1 medial branch is at the posterior and inferior 6 o'clock position of the L5-S1 facet joint. Under fluoroscopic guidance, the Quincke needle inserted for the L5 MBB was redirected until contact was made with os over the inferior and postero aspect of the sacrum, at the 6 o' clock position under the L5-S1 facet joint (Target area). After negative aspiration for blood, 0.5 mL of the nerve block solution was injected without difficulty or complication. The needle was removed intact.  Nerve block solution: 0.2% PF-Ropivacaine + Triamcinolone (40 mg/mL) diluted to a final concentration of 4 mg of Triamcinolone/mL of Ropivacaine The unused portion of the solution was discarded in the proper designated containers. Procedural Needles: 22-gauge, 3.5-inch, Quincke needles used for all levels.  Once the entire procedure was completed, the treated area was cleaned, making sure to leave some of the prepping solution back to take advantage of its long term bactericidal properties.   Illustration of the posterior view of the lumbar spine and the posterior neural structures. Laminae of L2 through S1 are labeled. DPRL5, dorsal primary ramus of L5; DPRS1, dorsal primary ramus of S1; DPR3, dorsal primary ramus of L3; FJ, facet (zygapophyseal) joint L3-L4; I, inferior articular process of L4; LB1, lateral branch of dorsal primary ramus of L1; IAB, inferior articular branches from L3 medial branch (supplies L4-L5  facet joint); IBP, intermediate branch plexus; MB3, medial branch of dorsal primary ramus of L3; NR3, third lumbar nerve root; S, superior articular process of L5; SAB, superior articular branches from L4 (supplies L4-5 facet joint also); TP3, transverse process of L3.  Vitals:   10/16/19 0818 10/16/19 0838 10/16/19 0844 10/16/19 0850  BP: 121/62 133/62 (!) 151/81 (!) 137/92  Pulse:      Resp:  20 (!) 24 20  Temp:      SpO2:  97% 95% 96%  Weight: 221 lb (100.2 kg)     Height: 5\' 3"  (1.6 m)        Start Time: 0838 hrs. End Time: 0850 hrs.  Imaging Guidance (Spinal):          Type of Imaging Technique: Fluoroscopy Guidance (Spinal) Indication(s): Assistance in needle guidance and placement for procedures requiring needle placement in or near specific anatomical locations not easily accessible without such assistance. Exposure Time: Please see nurses notes. Contrast: None used. Fluoroscopic Guidance: I was personally present during the use of fluoroscopy. "Tunnel Vision Technique" used to obtain the best possible view of the target area. Parallax error corrected before commencing the procedure. "Direction-depth-direction" technique used to introduce the needle under continuous pulsed fluoroscopy. Once target was reached, antero-posterior, oblique, and lateral fluoroscopic projection used confirm needle placement in all planes. Images permanently stored in EMR. Interpretation: No contrast injected. I personally interpreted the imaging intraoperatively. Adequate needle placement confirmed in multiple planes. Permanent images saved into the patient's record.  Antibiotic Prophylaxis:   Anti-infectives (From admission, onward)   None     Indication(s): None identified  Post-operative Assessment:  Post-procedure Vital Signs:  Pulse/HCG Rate: 8884 Temp: 97.8 F (36.6 C) Resp: 20 BP: (!) 137/92 SpO2: 96 %  EBL: None  Complications: No immediate post-treatment complications observed by  team, or reported by patient.  About 10 minutes after the procedure, the patient indicated again having some increase in the pain  Note: The patient tolerated the entire procedure well. A repeat set of vitals were taken after the procedure and the patient was kept under observation following institutional policy, for this  type of procedure. Post-procedural neurological assessment was performed, showing return to baseline, prior to discharge. The patient was provided with post-procedure discharge instructions, including a section on how to identify potential problems. Should any problems arise concerning this procedure, the patient was given instructions to immediately contact us, at any time, without hesitation. In any case, we plan to contact the patient by telephone for a follow-up status report regarding this interventional procedure.  Comments:  No additional relevant information.  Plan of Care  Orders:  Orders Placed This Encounter  Procedures  . LUMBAR FACET(MEDIAL BRANCH NERVE BLOCK) MBNB    Scheduling Instructions:     Procedure: Lumbar facet block (AKA.: Lumbosacral medial branch nerve block)     Side: Bilateral     Level: L3-4, L4-5, & L5-S1 Facets (L2, L3, L4, L5, & S1 Medial Branch Nerves)     Sedation: Patient's choice.     Timeframe: Today    Order Specific Question:   Where will this procedure be performed?    Answer:   [name]  . [facility] C-ARM 1-60 MIN NO REPORT    Intraoperative interpretation by procedural physician at [place].    Standing Status:   Standing    Number of Occurrences:   1    Order Specific Question:   Reason for exam:    Answer:   Assistance in needle guidance and placement for procedures requiring needle placement in or near specific anatomical locations not easily accessible without such assistance.  . Informed Consent Details: Physician/Practitioner Attestation; Transcribe to consent form and obtain patient signature     Nursing Order: Transcribe to consent form and obtain patient signature. Note: Always confirm laterality of pain with Ms. Cafaro, before procedure. Procedure: Lumbar Facet Block  under fluoroscopic guidance Indication/Reason: Low Back Pain, with our without leg pain, due to Facet Joint Arthralgia (Joint Pain) known as Lumbar Facet Syndrome, secondary to Lumbar, and/or Lumbosacral Spondylosis (Arthritis of the Spine), without myelopathy or radiculopathy (Nerve Damage). Provider Attestation: I, [place] Dossie Arbour, MD, (Pain Management Specialist), the physician/practitioner, attest that I have discussed with the patient the benefits, risks, side effects, alternatives, likelihood of achieving goals and potential problems during recovery for the procedure that I have provided informed consent.  . Care order/instruction: Please confirm that the patient has stopped the Coumadin (Warfarin) X 5 days prior to procedure or surgery.    Please confirm that the patient has stopped the Coumadin (Warfarin) X 5 days prior to procedure or surgery.    Standing Status:   Standing    Number of Occurrences:   1  . Provide equipment / supplies at bedside    Equipment required: Single use, disposable, "Block Tray"    Standing Status:   Standing    Number of Occurrences:   1    Order Specific Question:   Specify    Answer:   Block Tray  . Bleeding precautions    Standing Status:   Standing    Number of Occurrences:   1  . Latex precautions    Activate Latex-Free Protocol.    Standing Status:   Standing    Number of Occurrences:   1   Chronic Opioid Analgesic:  Hydrocodone/APAP 5/325, 1 tab PO QD PRN (5 mg/day of hydrocodone) MME: 5 mg/day.   Medications ordered for procedure: Meds ordered this encounter  Medications  . lidocaine (XYLOCAINE) 2 % (with pres) injection 400 mg  . ropivacaine (PF) 2 mg/mL (0.2%) (NAROPIN)  injection 18 mL  . triamcinolone acetonide (KENALOG-40) injection 80 mg  .  ketorolac (TORADOL) injection 60 mg  . orphenadrine (NORFLEX) injection 60 mg   Medications administered: We administered lidocaine, ropivacaine (PF) 2 mg/mL (0.2%), and triamcinolone acetonide.  See the medical record for exact dosing, route, and time of administration.  Follow-up plan:   Return in about 2 weeks (around 10/30/2019) for (VV), (PP).       Interventional management options:  Considering:   NOTE:COUMADINANTICOAGULATION(Stop:5days  Re-start: 2hrs) NO RFAuntil BMIis <35. Diagnostic L5-S1 LESI Diagnostic bilateral L3TFESI Diagnostic bilateral L4TFESI Diagnostic bilateral L5 TFESI Diagnostic left SI joint block #1  Possible righ SI joint RFA Possible bilateral lumbar facet RFA(NO RFAuntil BMIis <35.) Diagnostic right IA hip joint injection Diagnostic right femoral +ObturatorNB Possible right femoral + obturator nerve RFA   Palliative PRN treatment(s):   Palliative/Therapeutic left L4-5 LESI #2 Palliative left lumbar facet block#6 Palliativeright lumbar facet block(s) #10 Palliative right SI joint block #4(not as effective as facet blocks) Diagnostic left SI joint block #2 (not as effective in controlling the low back pain as the facet injections) Palliative/Therapeutic (Midline) caudalESI#4    Recent Visits Date Type Provider Dept  08/28/19 Procedure visit Milinda Pointer, MD [name]  08/16/19 Telemedicine Milinda Pointer, [facility]  07/25/19 Telemedicine Milinda Pointer, MD [name]  Showing recent visits within past 90 days and meeting all other requirements   Today's Visits Date Type Provider Dept  10/16/19 Procedure visit Milinda Pointer, MD [name]  Showing today's visits and meeting all other requirements   Future Appointments Date Type Provider Dept  10/31/19 Appointment Milinda Pointer, MD [name]  01/01/20 Appointment Milinda Pointer, MD [name]  Showing future appointments within next 90 days and meeting all other requirements   Disposition: Discharge home  Discharge (Date  Time): 10/16/2019; 0900 hrs.   Primary Care Physician: Tracie Harrier, MD Location: [facility] Note by: Gaspar Cola, MD Date: 10/16/2019; Time: 9:34 AM  Disclaimer:  Medicine is not an Chief Strategy Officer. The only guarantee in medicine is that nothing is guaranteed. It is important to note that the decision to proceed with this intervention was based on the information collected from the patient. The Data and conclusions were drawn from the patient's questionnaire, the interview, and the physical examination. Because the information was provided in large part by the patient, it cannot be guaranteed that it has not been purposely or unconsciously manipulated. Every effort has been made to obtain as much relevant data as possible for this evaluation. It is important to note that the conclusions that lead to this procedure are derived in large part from the available data. Always take into account that the treatment will also be dependent on availability of resources and existing treatment guidelines, considered by other Pain Management Practitioners as being common knowledge and practice, at the time of the intervention. For Medico-Legal purposes, it is also important to point out that variation in procedural techniques and pharmacological choices are the acceptable norm. The indications, contraindications, technique, and results of the above procedure should only be interpreted and judged by a Board-Certified Interventional Pain Specialist with extensive familiarity and expertise in the same exact procedure and technique.

## 2019-10-16 NOTE — Patient Instructions (Addendum)
____________________________________________________________________________________________  Post-Procedure Discharge Instructions  Instructions:  Apply ice:   Purpose: This will minimize any swelling and discomfort after procedure.   When: Day of procedure, as soon as you get home.  How: Fill a plastic sandwich bag with crushed ice. Cover it with a small towel and apply to injection site.  How long: (15 min on, 15 min off) Apply for 15 minutes then remove x 15 minutes.  Repeat sequence on day of procedure, until you go to bed.  Apply heat:   Purpose: To treat any soreness and discomfort from the procedure.  When: Starting the next day after the procedure.  How: Apply heat to procedure site starting the day following the procedure.  How long: May continue to repeat daily, until discomfort goes away.  Food intake: Start with clear liquids (like water) and advance to regular food, as tolerated.   Physical activities: Keep activities to a minimum for the first 8 hours after the procedure. After that, then as tolerated.  Driving: If you have received any sedation, be responsible and do not drive. You are not allowed to drive for 24 hours after having sedation.  Blood thinner: (Applies only to those taking blood thinners) You may restart your blood thinner 6 hours after your procedure.  Insulin: (Applies only to Diabetic patients taking insulin) As soon as you can eat, you may resume your normal dosing schedule.  Infection prevention: Keep procedure site clean and dry. Shower daily and clean area with soap and water.  Post-procedure Pain Diary: Extremely important that this be done correctly and accurately. Recorded information will be used to determine the next step in treatment. For the purpose of accuracy, follow these rules:  Evaluate only the area treated. Do not report or include pain from an untreated area. For the purpose of this evaluation, ignore all other areas of pain,  except for the treated area.  After your procedure, avoid taking a long nap and attempting to complete the pain diary after you wake up. Instead, set your alarm clock to go off every hour, on the hour, for the initial 8 hours after the procedure. Document the duration of the numbing medicine, and the relief you are getting from it.  Do not go to sleep and attempt to complete it later. It will not be accurate. If you received sedation, it is likely that you were given a medication that may cause amnesia. Because of this, completing the diary at a later time may cause the information to be inaccurate. This information is needed to plan your care.  Follow-up appointment: Keep your post-procedure follow-up evaluation appointment after the procedure (usually 2 weeks for most procedures, 6 weeks for radiofrequencies). DO NOT FORGET to bring you pain diary with you.   Expect: (What should I expect to see with my procedure?)  From numbing medicine (AKA: Local Anesthetics): Numbness or decrease in pain. You may also experience some weakness, which if present, could last for the duration of the local anesthetic.  Onset: Full effect within 15 minutes of injected.  Duration: It will depend on the type of local anesthetic used. On the average, 1 to 8 hours.   From steroids (Applies only if steroids were used): Decrease in swelling or inflammation. Once inflammation is improved, relief of the pain will follow.  Onset of benefits: Depends on the amount of swelling present. The more swelling, the longer it will take for the benefits to be seen. In some cases, up to 10 days.  Duration: Steroids will stay in the system x 2 weeks. Duration of benefits will depend on multiple posibilities including persistent irritating factors.  Side-effects: If present, they may typically last 2 weeks (the duration of the steroids).  Frequent: Cramps (if they occur, drink Gatorade and take over-the-counter Magnesium 450-500 mg  once to twice a day); water retention with temporary weight gain; increases in blood sugar; decreased immune system response; increased appetite.  Occasional: Facial flushing (red, warm cheeks); mood swings; menstrual changes.  Uncommon: Long-term decrease or suppression of natural hormones; bone thinning. (These are more common with higher doses or more frequent use. This is why we prefer that our patients avoid having any injection therapies in other practices.)   Very Rare: Severe mood changes; psychosis; aseptic necrosis.  From procedure: Some discomfort is to be expected once the numbing medicine wears off. This should be minimal if ice and heat are applied as instructed.  Call if: (When should I call?)  You experience numbness and weakness that gets worse with time, as opposed to wearing off.  New onset bowel or bladder incontinence. (Applies only to procedures done in the spine)  Emergency Numbers:  Durning business hours (Monday - Thursday, 8:00 AM - 4:00 PM) (Friday, 9:00 AM - 12:00 Noon): [phone]  After hours: [phone]  NOTE: If you are having a problem and are unable connect with, or to talk to a provider, then go to your nearest urgent care or emergency department. If the problem is serious and urgent, please call 911. ____________________________________________________________________________________________   Pain Management Discharge Instructions  General Discharge Instructions :  If you need to reach your doctor call: Monday-Friday 8:00 am - 4:00 pm at [phone] or toll free [phone].  After clinic hours [phone] to have operator reach doctor.  Bring all of your medication bottles to all your appointments in the pain clinic.  To cancel or reschedule your appointment with Pain Management please remember to call 24 hours in advance to avoid a fee.  Refer to the educational materials which you have been given on: General Risks, I had my  Procedure. Discharge Instructions, Post Sedation.  Post Procedure Instructions:  The drugs you were given will stay in your system until tomorrow, so for the next 24 hours you should not drive, make any legal decisions or drink any alcoholic beverages.  You may eat anything you prefer, but it is better to start with liquids then soups and crackers, and gradually work up to solid foods.  Please notify your doctor immediately if you have any unusual bleeding, trouble breathing or pain that is not related to your normal pain.  Depending on the type of procedure that was done, some parts of your body may feel week and/or numb.  This usually clears up by tonight or the next day.  Walk with the use of an assistive device or accompanied by an adult for the 24 hours.  You may use ice on the affected area for the first 24 hours.  Put ice in a Ziploc bag and cover with a towel and place against area 15 minutes on 15 minutes off.  You may switch to heat after 24 hours.Facet Blocks Patient Information  Description: The facets are joints in the spine between the vertebrae.  Like any joints in the body, facets can become irritated and painful.  Arthritis can also effect the facets.  By injecting steroids and local anesthetic in and around these joints, we can temporarily block the nerve  supply to them.  Steroids act directly on irritated nerves and tissues to reduce selling and inflammation which often leads to decreased pain.  Facet blocks may be done anywhere along the spine from the neck to the low back depending upon the location of your pain.   After numbing the skin with local anesthetic (like Novocaine), a small needle is passed onto the facet joints under x-ray guidance.  You may experience a sensation of pressure while this is being done.  The entire block usually lasts about 15-25 minutes.   Conditions which may be treated by facet blocks:   Low back/buttock pain  Neck/shoulder pain  Certain  types of headaches  Preparation for the injection:  1. Do not eat any solid food or dairy products within 8 hours of your appointment. 2. You may drink clear liquid up to 3 hours before appointment.  Clear liquids include water, black coffee, juice or soda.  No milk or cream please. 3. You may take your regular medication, including pain medications, with a sip of water before your appointment.  Diabetics should hold regular insulin (if taken separately) and take 1/2 normal NPH dose the morning of the procedure.  Carry some sugar containing items with you to your appointment. 4. A driver must accompany you and be prepared to drive you home after your procedure. 5. Bring all your current medications with you. 6. An IV may be inserted and sedation may be given at the discretion of the physician. 7. A blood pressure cuff, EKG and other monitors will often be applied during the procedure.  Some patients may need to have extra oxygen administered for a short period. 8. You will be asked to provide medical information, including your allergies and medications, prior to the procedure.  We must know immediately if you are taking blood thinners (like Coumadin/Warfarin) or if you are allergic to IV iodine contrast (dye).  We must know if you could possible be pregnant.  Possible side-effects:   Bleeding from needle site  Infection (rare, may require surgery)  Nerve injury (rare)  Numbness & tingling (temporary)  Difficulty urinating (rare, temporary)  Spinal headache (a headache worse with upright posture)  Light-headedness (temporary)  Pain at injection site (serveral days)  Decreased blood pressure (rare, temporary)  Weakness in arm/leg (temporary)  Pressure sensation in back/neck (temporary)   Call if you experience:   Fever/chills associated with headache or increased back/neck pain  Headache worsened by an upright position  New onset, weakness or numbness of an extremity  below the injection site  Hives or difficulty breathing (go to the emergency room)  Inflammation or drainage at the injection site(s)  Severe back/neck pain greater than usual  New symptoms which are concerning to you  Please note:  Although the local anesthetic injected can often make your back or neck feel good for several hours after the injection, the pain will likely return. It takes 3-7 days for steroids to work.  You may not notice any pain relief for at least one week.  If effective, we will often do a series of 2-3 injections spaced 3-6 weeks apart to maximally decrease your pain.  After the initial series, you may be a candidate for a more permanent nerve block of the facets.  If you have any questions, please call #[phone]) [facility]

## 2019-10-17 ENCOUNTER — Telehealth: Payer: Self-pay

## 2019-10-17 NOTE — Telephone Encounter (Signed)
Post procedure phone call.  LM 

## 2019-10-25 ENCOUNTER — Ambulatory Visit
Admission: RE | Admit: 2019-10-25 | Discharge: 2019-10-25 | Disposition: A | Discharge status: Home / Self Care | Payer: PPO | Source: Ambulatory Visit | Attending: Internal Medicine | Admitting: Internal Medicine

## 2019-10-25 DIAGNOSIS — Z1231 Encounter for screening mammogram for malignant neoplasm of breast: Secondary | ICD-10-CM | POA: Diagnosis not present

## 2019-10-30 ENCOUNTER — Telehealth: Payer: Self-pay

## 2019-10-30 ENCOUNTER — Encounter: Payer: Self-pay | Admitting: Pain Medicine

## 2019-10-30 NOTE — Progress Notes (Signed)
Patient: Kara [name]  Service Category: E/M  Provider: Gaspar Cola, MD  DOB: Apr 26, 1951  DOS: 10/31/2019  Location: Office  MRN: [id number]  Setting: Ambulatory outpatient  Referring Provider: Tracie Harrier, MD  Type: Established Patient  Specialty: Interventional Pain Management  PCP: Tracie Harrier, MD  Location: Remote location  Delivery: TeleHealth     Virtual Encounter - Pain Management PROVIDER NOTE: Information contained herein reflects review and annotations entered in association with encounter. Interpretation of such information and data should be left to medically-trained personnel. Information provided to patient can be located elsewhere in the medical record under "Patient Instructions". Document created using STT-dictation technology, any transcriptional errors that may result from process are unintentional.    Contact & Pharmacy Preferred: [phone] Home: [phone] (home) Mobile: [phone] (mobile) E-mail: [email or web address]  [place], [address] [place] [address] Phone: [phone] Fax: [phone]   Pre-screening  Kara [name] offered "in-person" vs "virtual" encounter. She indicated preferring virtual for this encounter.   Reason COVID-19*  Social distancing based on CDC and AMA recommendations.   I contacted Kara [name] on 10/31/2019 via telephone.      I clearly identified myself as Gaspar Cola, MD. I verified that I was speaking with the correct person using two identifiers (Name: Kara [name], and date of birth: November 27, 1950).  Consent I sought verbal advanced consent from Kara [name] for virtual visit interactions. I informed Kara [name] of possible security and privacy concerns, risks, and limitations associated with providing "not-in-person" medical evaluation and management services. I also informed Kara [name] of the  availability of "in-person" appointments. Finally, I informed her that there would be a charge for the virtual visit and that she could be  personally, fully or partially, financially responsible for it. Kara [name] expressed understanding and agreed to proceed.   Historic Elements   Kara [name] is a 69 y.o. year old, female patient evaluated today after her last contact with our practice on 10/30/2019. 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 She also  has a past surgical history that includes Knee surgery; Carpal tunnel release (Bilateral); Colon resection sigmoid (N/A, 11/02/2016); Colostomy (N/A, 11/02/2016); Sigmoidoscopy (N/A, 11/13/2016); PULMONARY VENOGRAPHY (N/A, 11/19/2016); Colonoscopy with propofol (N/A, 02/03/2017); Colon surgery (11/02/2016); IVC FILTER INSERTION (Right, 10/2016); Colostomy reversal (N/A, 02/24/2017); Appendectomy (N/A, 02/24/2017); Lysis of adhesion (N/A, 02/24/2017); laparoscopy (N/A, 02/24/2017); Ileo loop diversion (N/A, 02/24/2017); Sigmoidoscopy (N/A, 05/19/2017); Ileostomy closure (N/A, 06/01/2017); IVC FILTER REMOVAL (N/A, 08/09/2017); Sacroplasty (N/A, 11/08/2017); Cataract extraction w/PHACO (Left, 11/21/2018); and Cataract extraction w/PHACO (Right, 12/12/2018). Kara [name] has a current medication list which includes the following prescription(s): aspirin ec, calcium carbonate, calcium polycarbophil, candesartan, vitamin d3, cyclobenzaprine, dulaglutide, gabapentin, glipizide, hydrocodone-acetaminophen, [START ON 11/05/2019] hydrocodone-acetaminophen, [START ON 12/05/2019] hydrocodone-acetaminophen, loperamide, meclizine, multi-vitamins, nystatin cream, ropinirole, vitamin  b-12, warfarin, and warfarin. She  reports that she has never smoked. She has never used smokeless tobacco. She reports that she does not drink alcohol or use drugs. Kara [name] is allergic to adhesive [tape]; other; latex; and morphine and related.   HPI  Today, she is being contacted for a post-procedure assessment.  The patient indicates that she attained excellent relief of the pain with the palliative bilateral lumbar facet block.  For the duration of the local anesthetic she had 100% relief.  Immediately after the procedure she did experience an increase in the pain for which we gave her a Toradol/Norflex IM injection 60/60, which she indicated did help quite a bit.  When she got home she put some ice on it for 15 minutes on and 15 minutes off and this seems to have helped considerably.  Right now she indicates that she is 80% better than before she had the injection.  Before the injection she stated that her pain was an 8/10 and right now she says that intermittently will get to a 2/10.  She is currently on vacation in [place] and she refers that for the time being she does not really need anything.  Most of the pain, when she gets it it is on the right side and it is typically associated with prolonged standing or prolonged walking.  Otherwise, she refers that it is helping her enjoy her vacation.  Post-Procedure Evaluation  Procedure: Palliative bilateral lumbar facet block under fluoroscopic guidance, no sedation Pre-procedure pain level: 8/10 Post-procedure: 6/10 (< 50% relief)  Sedation: None.  Effectiveness during initial hour after procedure(Ultra-Short Term Relief):   100%.  Local anesthetic used: Long-acting (4-6 hours) Effectiveness: Defined as any analgesic benefit obtained secondary to the administration of local anesthetics. This carries significant diagnostic value as to the etiological location, or anatomical origin, of the pain. Duration of benefit is expected to  coincide with the duration of the local anesthetic used.  Effectiveness during initial 4-6 hours after procedure(Short-Term Relief):   100%.  Long-term benefit: Defined as any relief past the pharmacologic duration of the local anesthetics.  Effectiveness past the initial 6 hours after procedure(Long-Term Relief):   80%.  Current benefits: Defined as benefit that persist at this time.   Analgesia:  Ongoing 80% improvement in her pain. Function: Kara [name] reports improvement in function ROM: Kara [name] reports improvement in ROM  Pharmacotherapy Assessment  Analgesic: Hydrocodone/APAP 5/325, 1 tab PO QD PRN (5 mg/day of hydrocodone) MME: 5 mg/day.   Monitoring: [place] PMP: PDMP reviewed during this encounter.       Pharmacotherapy: No side-effects or adverse reactions reported. Compliance: No problems identified. Effectiveness: Clinically acceptable. Plan: Refer to "POC".  UDS:  Summary  Date Value Ref Range Status  09/20/2019 Note  Final    Comment:    ==================================================================== [employer] Select 13 (MW) ==================================================================== Test                             Result       Flag       Units Drug Absent but Declared for Prescription Verification   Hydrocodone                    Not Detected UNEXPECTED ng/mg creat ==================================================================== Test                      Result    Flag   Units      Ref Range   Creatinine              126              mg/dL      >=20 ==================================================================== Declared Medications:  The flagging and interpretation on this report are based on the  following declared medications.  Unexpected results may arise from  inaccuracies in the declared medications.  **  Note: The testing scope of this panel includes these medications:  Hydrocodone  **Note: The testing scope of  this panel does not include the  following reported medications:  Acetaminophen  Aspirin  Calcium  Candesartan  Cholecalciferol  Cyanocobalamin  Cyclobenzaprine  Dulaglutide  Gabapentin  Glipizide  Loperamide  Meclizine  Multivitamin  Nystatin  Ropinirole  Warfarin ==================================================================== For clinical consultation, please call [phone]. ====================================================================     Laboratory Chemistry Profile   Renal Lab Results  Component Value Date   BUN 10 07/09/2019   CREATININE 0.63 07/09/2019   GFRAA >60 07/09/2019   GFRNONAA >60 07/09/2019     Hepatic Lab Results  Component Value Date   AST 38 07/09/2019   ALT 43 07/09/2019   ALBUMIN 3.6 07/09/2019   ALKPHOS 51 07/09/2019   LIPASE 23 07/09/2019     Electrolytes Lab Results  Component Value Date   NA 139 07/09/2019   K 3.7 07/09/2019   CL 110 07/09/2019   CALCIUM 8.5 (L) 07/09/2019   MG 2.0 11/02/2017   PHOS 3.3 11/07/2016     Bone Lab Results  Component Value Date   25OHVITD1 25 (L) 11/02/2017   25OHVITD2 <1.0 11/02/2017   25OHVITD3 25 11/02/2017     Inflammation (CRP: Acute Phase) (ESR: Chronic Phase) Lab Results  Component Value Date   CRP 5.3 (H) 11/02/2017   ESRSEDRATE 12 11/02/2017       Note: Above Lab results reviewed.   Imaging  MM 3D SCREEN BREAST BILATERAL CLINICAL DATA:  Screening.  EXAM: DIGITAL SCREENING BILATERAL MAMMOGRAM WITH TOMO AND CAD  COMPARISON:  Previous exam(s).  ACR Breast Density Category b: There are scattered areas of fibroglandular density.  FINDINGS: There are no findings suspicious for malignancy. Images were processed with CAD.  IMPRESSION: No mammographic evidence of malignancy. A result letter of this screening mammogram will be mailed directly to the patient.  RECOMMENDATION: Screening mammogram in one year. (Code:[id number]-B-01Y)  BI-RADS CATEGORY  1:  Negative.  Electronically Signed   By: Lillia Mountain M.D.   On: 10/26/2019 12:44  Assessment  The encounter diagnosis was Chronic pain syndrome.  Plan of Care  Problem-specific:  No problem-specific Assessment & Plan notes found for this encounter.  Kara [name] has a current medication list which includes the following long-term medication(s): calcium carbonate, candesartan, glipizide, hydrocodone-acetaminophen, [START ON 11/05/2019] hydrocodone-acetaminophen, [START ON 12/05/2019] hydrocodone-acetaminophen, ropinirole, warfarin, and warfarin.  Pharmacotherapy (Medications Ordered): No orders of the defined types were placed in this encounter.  Orders:  No orders of the defined types were placed in this encounter.  Follow-up plan:   Return for regular appointment.      Interventional management options:  Considering:   NOTE:COUMADINANTICOAGULATION(Stop:5days  Re-start: 2hrs) NO RFAuntil BMIis <35. Diagnostic L5-S1 LESI Diagnostic bilateral L3TFESI Diagnostic bilateral L4TFESI Diagnostic bilateral L5 TFESI Diagnostic left SI joint block #1  Possible righ SI joint RFA Possible bilateral lumbar facet RFA(NO RFAuntil BMIis <35.) Diagnostic right IA hip joint injection Diagnostic right femoral +ObturatorNB Possible right femoral + obturator nerve RFA   Palliative PRN treatment(s):   Palliative/Therapeutic left L4-5 LESI #2 Palliative left lumbar facet block#6 Palliativeright lumbar facet block(s) #10 Palliative right SI joint block #4(not as effective as facet blocks) Diagnostic left SI joint block #2 (not as effective in controlling the low back pain as the facet injections) Palliative/Therapeutic (Midline) caudalESI#4     Recent Visits Date Type Provider Dept  10/16/19 Procedure visit Milinda Pointer, MD [facility]  08/28/19 Procedure visit Milinda Pointer, MD [name]  08/16/19 Telemedicine  Milinda Pointer, MD [name]  Showing recent visits within past 90 days and meeting all other requirements   Today's Visits Date Type Provider Dept  10/31/19 Telemedicine Milinda Pointer, MD [name]  Showing today's visits and meeting all other requirements   Future Appointments Date Type Provider Dept  01/01/20 Appointment Milinda Pointer, MD [name]  Showing future appointments within next 90 days and meeting all other requirements   I discussed the assessment and treatment plan with the patient. The patient was provided an opportunity to ask questions and all were answered. The patient agreed with the plan and demonstrated an understanding of the instructions.  Patient advised to call back or seek an in-person evaluation if the symptoms or condition worsens.  Duration of encounter: 15 minutes.  Note by: Gaspar Cola, MD Date: 10/31/2019; Time: 3:22 PM

## 2019-10-31 ENCOUNTER — Ambulatory Visit: Payer: PPO | Attending: Pain Medicine | Admitting: Pain Medicine

## 2019-10-31 ENCOUNTER — Other Ambulatory Visit: Payer: Self-pay

## 2019-10-31 DIAGNOSIS — G894 Chronic pain syndrome: Secondary | ICD-10-CM

## 2019-11-08 DIAGNOSIS — Z7901 Long term (current) use of anticoagulants: Secondary | ICD-10-CM | POA: Diagnosis not present

## 2019-11-22 ENCOUNTER — Telehealth: Payer: Self-pay | Admitting: Pain Medicine

## 2019-11-22 NOTE — Telephone Encounter (Signed)
Patient called asking to have a procedure on July 20. I put her on for Lumbar Facet block at 7:45

## 2019-12-03 ENCOUNTER — Telehealth: Payer: PPO | Admitting: Pain Medicine

## 2019-12-05 NOTE — Progress Notes (Signed)
PROVIDER NOTE: Information contained herein reflects review and annotations entered in association with encounter. Interpretation of such information and data should be left to medically-trained personnel. Information provided to patient can be located elsewhere in the medical record under "Patient Instructions". Document created using STT-dictation technology, any transcriptional errors that may result from process are unintentional.    Patient: Kara [name]  Service Category: Procedure  Provider: Gaspar Cola, MD  DOB: 1950/11/21  DOS: 12/06/2019  Location: [facility]  MRN: [id number]  Setting: Ambulatory - outpatient  Referring Provider: Tracie Harrier, MD  Type: Established Patient  Specialty: Interventional Pain Management  PCP: Tracie Harrier, MD   Primary Reason for Visit: Interventional Pain Management Treatment. CC: Procedure  Procedure:          Anesthesia, Analgesia, Anxiolysis:  Type: Lumbar Facet, Medial Branch Block(s)          Primary Purpose: Diagnostic Region: Posterolateral Lumbosacral Spine Level: L2, L3, L4, L5, & S1 Medial Branch Level(s). Injecting these levels blocks the L3-4, L4-5, and L5-S1 lumbar facet joints. Laterality: Bilateral  Type: Local Anesthesia Indication(s): Analgesia         Route: Infiltration ([place]/IM) IV Access: Declined Sedation: Declined  Local Anesthetic: Lidocaine 1-2%  Position: Prone   Indications: 1. Spondylosis without myelopathy or radiculopathy, lumbosacral region   2. Lumbar facet syndrome (Bilateral)   3. Lumbar facet arthropathy (L3-4, L4-5, and L5-S1) (Bilateral)   4. 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 DDD (degenerative disc disease), lumbosacral   7. Chronic low back pain (1ry area of Pain) (Bilateral) w/o sciatica   8. 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 Morbid obesity with BMI of 40.0-44.9, adult  (HCC)    Pain Score: Pre-procedure: 10-Worst pain ever/10 Post-procedure: 3 /10   Pre-op Assessment:  Kara [name] is a 69 y.o. (year old), female patient, seen today for interventional treatment. She  has a past surgical history that includes Knee surgery; Carpal tunnel release (Bilateral); Colon resection sigmoid (N/A, 11/02/2016); Colostomy (N/A, 11/02/2016); Sigmoidoscopy (N/A, 11/13/2016); PULMONARY VENOGRAPHY (N/A, 11/19/2016); Colonoscopy with propofol (N/A, 02/03/2017); Colon surgery (11/02/2016); IVC FILTER INSERTION (Right, 10/2016); Colostomy reversal (N/A, 02/24/2017); Appendectomy (N/A, 02/24/2017); Lysis of adhesion (N/A, 02/24/2017); laparoscopy (N/A, 02/24/2017); [place]o loop diversion (N/A, 02/24/2017); Sigmoidoscopy (N/A, 05/19/2017); Ileostomy closure (N/A, 06/01/2017); IVC FILTER REMOVAL (N/A, 08/09/2017); Sacroplasty (N/A, 11/08/2017); Cataract extraction w/PHACO (Left, 11/21/2018); and Cataract extraction w/PHACO (Right, 12/12/2018). Kara [name] has a current medication list which includes the following prescription(s): aspirin ec, calcium carbonate, calcium polycarbophil, candesartan, vitamin d3, cyclobenzaprine, gabapentin, glipizide, hydrocodone-acetaminophen, meclizine, multi-vitamins, nystatin cream, ropinirole, tolterodine, vitamin b-12, dulaglutide, hydrocodone-acetaminophen, hydrocodone-acetaminophen, loperamide, warfarin, and warfarin. Her primarily concern today is the Procedure  Initial Vital Signs:  Pulse/HCG Rate: 88ECG Heart Rate: 90 Temp: (!) 97.3 F (36.3 C) Resp: 18 BP: 137/60 SpO2: 96 %  BMI: Estimated body mass index is 39.15 kg/m as calculated from the following:   Height as of this encounter: 5\' 3"  (1.6 m).   Weight as of this encounter: 221 lb (100.2 kg).  Risk Assessment: Allergies: Reviewed. She is allergic to adhesive [tape], other, latex, and morphine and related.  Allergy Precautions: None required Coagulopathies: Reviewed. None identified.    Blood-thinner therapy: None at this time Active Infection(s): Reviewed. None identified. Kara [name] is afebrile  Site Confirmation: Kara [name] was asked to confirm the procedure and laterality before marking the site Procedure  checklist: Completed Consent: Before the procedure and under the influence of no sedative(s), amnesic(s), or anxiolytics, the patient was informed of the treatment options, risks and possible complications. To fulfill our ethical and legal obligations, as recommended by the American Medical Association's Code of Ethics, I have informed the patient of my clinical impression; the nature and purpose of the treatment or procedure; the risks, benefits, and possible complications of the intervention; the alternatives, including doing nothing; the risk(s) and benefit(s) of the alternative treatment(s) or procedure(s); and the risk(s) and benefit(s) of doing nothing. The patient was provided information about the general risks and possible complications associated with the procedure. These may include, but are not limited to: failure to achieve desired goals, infection, bleeding, organ or nerve damage, allergic reactions, paralysis, and death. In addition, the patient was informed of those risks and complications associated to Spine-related procedures, such as failure to decrease pain; infection (i.e.: Meningitis, epidural or intraspinal abscess); bleeding (i.e.: epidural hematoma, subarachnoid hemorrhage, or any other type of intraspinal or peri-dural bleeding); organ or nerve damage (i.e.: Any type of peripheral nerve, nerve root, or spinal cord injury) with subsequent damage to sensory, motor, and/or autonomic systems, resulting in permanent pain, numbness, and/or weakness of one or several areas of the body; allergic reactions; (i.e.: anaphylactic reaction); and/or death. Furthermore, the patient was informed of those risks and complications associated with the  medications. These include, but are not limited to: allergic reactions (i.e.: anaphylactic or anaphylactoid reaction(s)); adrenal axis suppression; blood sugar elevation that in diabetics may result in ketoacidosis or comma; water retention that in patients with history of congestive heart failure may result in shortness of breath, pulmonary edema, and decompensation with resultant heart failure; weight gain; swelling or edema; medication-induced neural toxicity; particulate matter embolism and blood vessel occlusion with resultant organ, and/or nervous system infarction; and/or aseptic necrosis of one or more joints. Finally, the patient was informed that Medicine is not an exact science; therefore, there is also the possibility of unforeseen or unpredictable risks and/or possible complications that may result in a catastrophic outcome. The patient indicated having understood very clearly. We have given the patient no guarantees and we have made no promises. Enough time was given to the patient to ask questions, all of which were answered to the patient's satisfaction. Kara [name] has indicated that she wanted to continue with the procedure. Attestation: I, the ordering provider, attest that I have discussed with the patient the benefits, risks, side-effects, alternatives, likelihood of achieving goals, and potential problems during recovery for the procedure that I have provided informed consent. Date  Time: 12/06/2019  8:28 AM  Pre-Procedure Preparation:  Monitoring: As per clinic protocol. Respiration, ETCO2, SpO2, BP, heart rate and rhythm monitor placed and checked for adequate function Safety Precautions: Patient was assessed for positional comfort and pressure points before starting the procedure. Time-out: I initiated and conducted the "Time-out" before starting the procedure, as per protocol. The patient was asked to participate by confirming the accuracy of the "Time Out" information.  Verification of the correct person, site, and procedure were performed and confirmed by me, the nursing staff, and the patient. "Time-out" conducted as per Joint Commission's Universal Protocol (UP.01.01.01). Time: 0920  Description of Procedure:          Laterality: Bilateral. The procedure was performed in identical fashion on both sides. Levels:  L2, L3, L4, L5, & S1 Medial Branch Level(s) Area Prepped: Posterior Lumbosacral Region DuraPrep (Iodine Povacrylex [0.7% available iodine] and Isopropyl  Alcohol, 74% w/w) Safety Precautions: Aspiration looking for blood return was conducted prior to all injections. At no point did we inject any substances, as a needle was being advanced. Before injecting, the patient was told to immediately notify me if she was experiencing any new onset of "ringing in the ears, or metallic taste in the mouth". No attempts were made at seeking any paresthesias. Safe injection practices and needle disposal techniques used. Medications properly checked for expiration dates. SDV (single dose vial) medications used. After the completion of the procedure, all disposable equipment used was discarded in the proper designated medical waste containers. Local Anesthesia: Protocol guidelines were followed. The patient was positioned over the fluoroscopy table. The area was prepped in the usual manner. The time-out was completed. The target area was identified using fluoroscopy. A 12-in long, straight, sterile hemostat was used with fluoroscopic guidance to locate the targets for each level blocked. Once located, the skin was marked with an approved surgical skin marker. Once all sites were marked, the skin (epidermis, dermis, and hypodermis), as well as deeper tissues (fat, connective tissue and muscle) were infiltrated with a small amount of a short-acting local anesthetic, loaded on a 10cc syringe with a 25G, 1.5-in  Needle. An appropriate amount of time was allowed for local anesthetics  to take effect before proceeding to the next step. Local Anesthetic: Lidocaine 2.0% The unused portion of the local anesthetic was discarded in the proper designated containers. Technical explanation of process:  L2 Medial Branch Nerve Block (MBB): The target area for the L2 medial branch is at the junction of the postero-lateral aspect of the superior articular process and the superior, posterior, and medial edge of the transverse process of L3. Under fluoroscopic guidance, a Quincke needle was inserted until contact was made with os over the superior postero-lateral aspect of the pedicular shadow (target area). After negative aspiration for blood, 0.5 mL of the nerve block solution was injected without difficulty or complication. The needle was removed intact. L3 Medial Branch Nerve Block (MBB): The target area for the L3 medial branch is at the junction of the postero-lateral aspect of the superior articular process and the superior, posterior, and medial edge of the transverse process of L4. Under fluoroscopic guidance, a Quincke needle was inserted until contact was made with os over the superior postero-lateral aspect of the pedicular shadow (target area). After negative aspiration for blood, 0.5 mL of the nerve block solution was injected without difficulty or complication. The needle was removed intact. L4 Medial Branch Nerve Block (MBB): The target area for the L4 medial branch is at the junction of the postero-lateral aspect of the superior articular process and the superior, posterior, and medial edge of the transverse process of L5. Under fluoroscopic guidance, a Quincke needle was inserted until contact was made with os over the superior postero-lateral aspect of the pedicular shadow (target area). After negative aspiration for blood, 0.5 mL of the nerve block solution was injected without difficulty or complication. The needle was removed intact. L5 Medial Branch Nerve Block (MBB): The target  area for the L5 medial branch is at the junction of the postero-lateral aspect of the superior articular process and the superior, posterior, and medial edge of the sacral ala. Under fluoroscopic guidance, a Quincke needle was inserted until contact was made with os over the superior postero-lateral aspect of the pedicular shadow (target area). After negative aspiration for blood, 0.5 mL of the nerve block solution was injected without difficulty or  complication. The needle was removed intact. S1 Medial Branch Nerve Block (MBB): The target area for the S1 medial branch is at the posterior and inferior 6 o'clock position of the L5-S1 facet joint. Under fluoroscopic guidance, the Quincke needle inserted for the L5 MBB was redirected until contact was made with os over the inferior and postero aspect of the sacrum, at the 6 o' clock position under the L5-S1 facet joint (Target area). After negative aspiration for blood, 0.5 mL of the nerve block solution was injected without difficulty or complication. The needle was removed intact.  Nerve block solution: 0.2% PF-Ropivacaine + Triamcinolone (40 mg/mL) diluted to a final concentration of 4 mg of Triamcinolone/mL of Ropivacaine The unused portion of the solution was discarded in the proper designated containers. Procedural Needles: 22-gauge, 7-inch, Quincke needles used for all levels.  Once the entire procedure was completed, the treated area was cleaned, making sure to leave some of the prepping solution back to take advantage of its long term bactericidal properties.   Illustration of the posterior view of the lumbar spine and the posterior neural structures. Laminae of L2 through S1 are labeled. DPRL5, dorsal primary ramus of L5; DPRS1, dorsal primary ramus of S1; DPR3, dorsal primary ramus of L3; FJ, facet (zygapophyseal) joint L3-L4; I, inferior articular process of L4; LB1, lateral branch of dorsal primary ramus of L1; IAB, inferior articular branches  from L3 medial branch (supplies L4-L5 facet joint); IBP, intermediate branch plexus; MB3, medial branch of dorsal primary ramus of L3; NR3, third lumbar nerve root; S, superior articular process of L5; SAB, superior articular branches from L4 (supplies L4-5 facet joint also); TP3, transverse process of L3.  Vitals:   12/06/19 0919 12/06/19 0924 12/06/19 0929 12/06/19 0933  BP: (!) 150/78 (!) 150/77 130/83 125/81  Pulse:      Resp: 14 15 18  (!) 23  Temp:      TempSrc:      SpO2: 93% 92% 96% 95%  Weight:      Height:         Start Time: 0920 hrs. End Time: 0930 hrs.  Imaging Guidance (Spinal):          Type of Imaging Technique: Fluoroscopy Guidance (Spinal) Indication(s): Assistance in needle guidance and placement for procedures requiring needle placement in or near specific anatomical locations not easily accessible without such assistance. Exposure Time: Please see nurses notes. Contrast: None used. Fluoroscopic Guidance: I was personally present during the use of fluoroscopy. "Tunnel Vision Technique" used to obtain the best possible view of the target area. Parallax error corrected before commencing the procedure. "Direction-depth-direction" technique used to introduce the needle under continuous pulsed fluoroscopy. Once target was reached, antero-posterior, oblique, and lateral fluoroscopic projection used confirm needle placement in all planes. Images permanently stored in EMR. Interpretation: No contrast injected. I personally interpreted the imaging intraoperatively. Adequate needle placement confirmed in multiple planes. Permanent images saved into the patient's record.  Antibiotic Prophylaxis:   Anti-infectives (From admission, onward)   None     Indication(s): None identified  Post-operative Assessment:  Post-procedure Vital Signs:  Pulse/HCG Rate: 8884 Temp: (!) 97.3 F (36.3 C) Resp: (!) 23 BP: 125/81 SpO2: 95 %  EBL: None  Complications: No immediate  post-treatment complications observed by team, or reported by patient.  Once again, after this procedure the patient indicated having a flareup of some pain in the lower back which I believe may be secondary to the fact that she is not getting the  procedure with some sedation and after we finished for the procedure, she has some reflex spasms of the paravertebral muscles which we have to go through for the procedure.  Again I treated this with Toradol 60 mg IM plus Norflex 60 mg IM with good results.  Note: The patient tolerated the entire procedure well. A repeat set of vitals were taken after the procedure and the patient was kept under observation following institutional policy, for this type of procedure. Post-procedural neurological assessment was performed, showing return to baseline, prior to discharge. The patient was provided with post-procedure discharge instructions, including a section on how to identify potential problems. Should any problems arise concerning this procedure, the patient was given instructions to immediately contact us, at any time, without hesitation. In any case, we plan to contact the patient by telephone for a follow-up status report regarding this interventional procedure.  Comments:  No additional relevant information.  Plan of Care  Orders:  Orders Placed This Encounter  Procedures  . LUMBAR FACET(MEDIAL BRANCH NERVE BLOCK) MBNB    Scheduling Instructions:     Procedure: Lumbar facet block (AKA.: Lumbosacral medial branch nerve block)     Side: Bilateral     Level: L3-4, L4-5, & L5-S1 Facets (L2, L3, L4, L5, & S1 Medial Branch Nerves)     Sedation: Patient's choice.     Timeframe: Today    Order Specific Question:   Where will this procedure be performed?    Answer:   [name]  . DG [facility] C-ARM 1-60 MIN NO REPORT    Intraoperative interpretation by procedural physician at [facility].    Standing Status:   Standing    Number of  Occurrences:   1    Order Specific Question:   Reason for exam:    Answer:   Assistance in needle guidance and placement for procedures requiring needle placement in or near specific anatomical locations not easily accessible without such assistance.  . Informed Consent Details: Physician/Practitioner Attestation; Transcribe to consent form and obtain patient signature    Nursing Order: Transcribe to consent form and obtain patient signature. Note: Always confirm laterality of pain with Kara [name], before procedure. Procedure: Lumbar Facet Block  under fluoroscopic guidance Indication/Reason: Low Back Pain, with our without leg pain, due to Facet Joint Arthralgia (Joint Pain) known as Lumbar Facet Syndrome, secondary to Lumbar, and/or Lumbosacral Spondylosis (Arthritis of the Spine), without myelopathy or radiculopathy (Nerve Damage). Provider Attestation: I, Standard Dossie Arbour, MD, (Pain Management Specialist), the physician/practitioner, attest that I have discussed with the patient the benefits, risks, side effects, alternatives, likelihood of achieving goals and potential problems during recovery for the procedure that I have provided informed consent.  . Care order/instruction: Please confirm that the patient has stopped the Coumadin (Warfarin) X 5 days prior to procedure or surgery.    Please confirm that the patient has stopped the Coumadin (Warfarin) X 5 days prior to procedure or surgery.    Standing Status:   Standing    Number of Occurrences:   1  . Provide equipment / supplies at bedside    Equipment required: Single use, disposable, "Block Tray"    Standing Status:   Standing    Number of Occurrences:   1    Order Specific Question:   Specify    Answer:   Block Tray  . Bleeding precautions    Standing Status:   Standing    Number of Occurrences:   1  .  Latex precautions    Activate Latex-Free Protocol.    Standing Status:   Standing    Number of Occurrences:   1    Chronic Opioid Analgesic:  Hydrocodone/APAP 5/325, 1 tab PO QD PRN (5 mg/day of hydrocodone) MME: 5 mg/day.   Medications ordered for procedure: Meds ordered this encounter  Medications  . lidocaine (XYLOCAINE) 2 % (with pres) injection 400 mg  . ropivacaine (PF) 2 mg/mL (0.2%) (NAROPIN) injection 18 mL  . triamcinolone acetonide (KENALOG-40) injection 80 mg  . ketorolac (TORADOL) injection 60 mg  . orphenadrine (NORFLEX) injection 60 mg   Medications administered: We administered lidocaine, ropivacaine (PF) 2 mg/mL (0.2%), triamcinolone acetonide, ketorolac, and orphenadrine.  See the medical record for exact dosing, route, and time of administration.  Follow-up plan:   Return in about 2 weeks (around 12/20/2019) for VV(15-min), (PP), pm on procedure day.       Interventional management options:  Considering:   NOTE:COUMADINANTICOAGULATION(Stop:5days  Re-start: 2hrs) NO RFAuntil BMIis <35. Diagnostic L5-S1 LESI Diagnostic bilateral L3TFESI Diagnostic bilateral L4TFESI Diagnostic bilateral L5 TFESI Diagnostic left SI joint block #1  Possible righ SI joint RFA Possible bilateral lumbar facet RFA(NO RFAuntil BMIis <35.) Diagnostic right IA hip joint injection Diagnostic right femoral +ObturatorNB Possible right femoral + obturator nerve RFA   Palliative PRN treatment(s):   Palliative/Therapeutic left L4-5 LESI #2 Palliative left lumbar facet block#6 Palliativeright lumbar facet block(s) #10 Palliative right SI joint block #4(not as effective as facet blocks) Diagnostic left SI joint block #2 (not as effective in controlling the low back pain as the facet injections) Palliative/Therapeutic (Midline) caudalESI#4      Recent Visits Date Type Provider Dept  10/31/19 Telemedicine Milinda Pointer, MD [name]  10/16/19 Procedure visit Milinda Pointer, MD [name]  Showing recent visits within past 90 days and  meeting all other requirements Today's Visits Date Type Provider Dept  12/06/19 Procedure visit Milinda Pointer, MD [name]  Showing today's visits and meeting all other requirements Future Appointments Date Type Provider Dept  12/19/19 Appointment Milinda Pointer, MD [name]  12/31/19 Appointment Milinda Pointer, MD [name]  Showing future appointments within next 90 days and meeting all other requirements  Disposition: Discharge home  Discharge (Date  Time): 12/06/2019; 1002 hrs.   Primary Care Physician: Tracie Harrier, MD Location: [facility] Note by: Gaspar Cola, MD Date: 12/06/2019; Time: 1:22 PM  Disclaimer:  Medicine is not an Chief Strategy Officer. The only guarantee in medicine is that nothing is guaranteed. It is important to note that the decision to proceed with this intervention was based on the information collected from the patient. The Data and conclusions were drawn from the patient's questionnaire, the interview, and the physical examination. Because the information was provided in large part by the patient, it cannot be guaranteed that it has not been purposely or unconsciously manipulated. Every effort has been made to obtain as much relevant data as possible for this evaluation. It is important to note that the conclusions that lead to this procedure are derived in large part from the available data. Always take into account that the treatment will also be dependent on availability of resources and existing treatment guidelines, considered by other Pain Management Practitioners as being common knowledge and practice, at the time of the intervention. For Medico-Legal purposes, it is also important to point out that variation in procedural techniques and pharmacological choices are the acceptable norm. The indications,  contraindications, technique, and results of the above procedure should only  be interpreted and judged by a Board-Certified Interventional Pain Specialist with extensive familiarity and expertise in the same exact procedure and technique.

## 2019-12-06 ENCOUNTER — Ambulatory Visit (HOSPITAL_BASED_OUTPATIENT_CLINIC_OR_DEPARTMENT_OTHER): Payer: PPO | Admitting: Pain Medicine

## 2019-12-06 ENCOUNTER — Other Ambulatory Visit: Payer: Self-pay

## 2019-12-06 ENCOUNTER — Encounter: Payer: Self-pay | Admitting: Pain Medicine

## 2019-12-06 ENCOUNTER — Ambulatory Visit
Admission: RE | Admit: 2019-12-06 | Discharge: 2019-12-06 | Disposition: A | Discharge status: Home / Self Care | Payer: PPO | Source: Ambulatory Visit | Attending: Pain Medicine | Admitting: Pain Medicine

## 2019-12-06 VITALS — BP 125/81 | HR 88 | Temp 97.3°F | Resp 23 | Ht 63.0 in | Wt 221.0 lb

## 2019-12-06 DIAGNOSIS — G8929 Other chronic pain: Secondary | ICD-10-CM | POA: Diagnosis not present

## 2019-12-06 DIAGNOSIS — M4300 Spondylolysis, site unspecified: Secondary | ICD-10-CM | POA: Diagnosis not present

## 2019-12-06 DIAGNOSIS — M5137 Other intervertebral disc degeneration, lumbosacral region: Secondary | ICD-10-CM | POA: Insufficient documentation

## 2019-12-06 DIAGNOSIS — Z7901 Long term (current) use of anticoagulants: Secondary | ICD-10-CM | POA: Diagnosis not present

## 2019-12-06 DIAGNOSIS — Z91040 Latex allergy status: Secondary | ICD-10-CM

## 2019-12-06 DIAGNOSIS — G8918 Other acute postprocedural pain: Secondary | ICD-10-CM | POA: Insufficient documentation

## 2019-12-06 DIAGNOSIS — M4306 Spondylolysis, lumbar region: Secondary | ICD-10-CM

## 2019-12-06 DIAGNOSIS — M4310 Spondylolisthesis, site unspecified: Secondary | ICD-10-CM

## 2019-12-06 DIAGNOSIS — Z6841 Body Mass Index (BMI) 40.0 and over, adult: Secondary | ICD-10-CM | POA: Diagnosis not present

## 2019-12-06 DIAGNOSIS — M47817 Spondylosis without myelopathy or radiculopathy, lumbosacral region: Secondary | ICD-10-CM

## 2019-12-06 DIAGNOSIS — M47816 Spondylosis without myelopathy or radiculopathy, lumbar region: Secondary | ICD-10-CM | POA: Insufficient documentation

## 2019-12-06 MED ORDER — ORPHENADRINE CITRATE 30 MG/ML IJ SOLN
INTRAMUSCULAR | Status: AC
  Filled 2019-12-06: qty 2

## 2019-12-06 MED ORDER — ROPIVACAINE HCL 2 MG/ML IJ SOLN
18.0000 mL | Freq: Once | INTRAMUSCULAR | Status: AC
  Administered 2019-12-06: 18 mL via PERINEURAL
  Filled 2019-12-06: qty 20

## 2019-12-06 MED ORDER — LIDOCAINE HCL 2 % IJ SOLN
20.0000 mL | Freq: Once | INTRAMUSCULAR | Status: AC
  Administered 2019-12-06: 400 mg
  Filled 2019-12-06: qty 20

## 2019-12-06 MED ORDER — KETOROLAC TROMETHAMINE 60 MG/2ML IM SOLN
60.0000 mg | Freq: Once | INTRAMUSCULAR | Status: AC
  Administered 2019-12-06: 60 mg via INTRAMUSCULAR

## 2019-12-06 MED ORDER — TRIAMCINOLONE ACETONIDE 40 MG/ML IJ SUSP
80.0000 mg | Freq: Once | INTRAMUSCULAR | Status: AC
  Administered 2019-12-06: 80 mg
  Filled 2019-12-06: qty 2

## 2019-12-06 MED ORDER — ORPHENADRINE CITRATE 30 MG/ML IJ SOLN
60.0000 mg | Freq: Once | INTRAMUSCULAR | Status: AC
  Administered 2019-12-06: 60 mg via INTRAMUSCULAR

## 2019-12-06 MED ORDER — KETOROLAC TROMETHAMINE 60 MG/2ML IM SOLN
INTRAMUSCULAR | Status: AC
  Filled 2019-12-06: qty 2

## 2019-12-06 NOTE — Progress Notes (Signed)
Feeling much better, states ready to go home.

## 2019-12-06 NOTE — Patient Instructions (Signed)

## 2019-12-07 ENCOUNTER — Telehealth: Payer: Self-pay

## 2019-12-07 NOTE — Telephone Encounter (Signed)
Post procedure phone call.  LM 

## 2019-12-11 ENCOUNTER — Ambulatory Visit: Payer: PPO | Admitting: Pain Medicine

## 2019-12-18 ENCOUNTER — Telehealth: Payer: Self-pay

## 2019-12-18 ENCOUNTER — Encounter: Payer: Self-pay | Admitting: Pain Medicine

## 2019-12-18 NOTE — Progress Notes (Signed)
Patient: Kara [name]  Service Category: E/M  Provider: Gaspar Cola, MD  DOB: 07/20/1950  DOS: 12/19/2019  Location: Office  MRN: [id number]  Setting: Ambulatory outpatient  Referring Provider: Tracie Harrier, MD  Type: Established Patient  Specialty: Interventional Pain Management  PCP: Tracie Harrier, MD  Location: Remote location  Delivery: TeleHealth     Virtual Encounter - Pain Management PROVIDER NOTE: Information contained herein reflects review and annotations entered in association with encounter. Interpretation of such information and data should be left to medically-trained personnel. Information provided to patient can be located elsewhere in the medical record under "Patient Instructions". Document created using STT-dictation technology, any transcriptional errors that may result from process are unintentional.    Contact & Pharmacy Preferred: [phone] Home: [phone] (home) Mobile: [phone] (mobile) E-mail: [email or web address]  [place], [place]. [place] [address] Phone: [phone] Fax: [phone]   Pre-screening  Kara [name] offered "in-person" vs "virtual" encounter. She indicated preferring virtual for this encounter.   Reason COVID-19*   Social distancing based on CDC and AMA recommendations.   I contacted Kara [name] on 12/19/2019 via telephone.      I clearly identified myself as Gaspar Cola, MD. I verified that I was speaking with the correct person using two identifiers (Name: Kara [name], and date of birth: 1950/06/05).  Consent I sought verbal advanced consent from Kara [name] for virtual visit interactions. I informed Kara [name] of possible security and privacy concerns, risks, and limitations associated with providing "not-in-person" medical evaluation and management services. I also informed Kara [name] of the  availability of "in-person" appointments. Finally, I informed her that there would be a charge for the virtual visit and that she could be  personally, fully or partially, financially responsible for it. Kara [name] expressed understanding and agreed to proceed.   Historic Elements   Kara [name] is a 69 y.o. year old, female patient evaluated today after her last contact with our practice on 12/18/2019. 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 She also  has a past surgical history that includes Knee surgery; Carpal tunnel release (Bilateral); Colon resection sigmoid (N/A, 11/02/2016); Colostomy (N/A, 11/02/2016); Sigmoidoscopy (N/A, 11/13/2016); PULMONARY VENOGRAPHY (N/A, 11/19/2016); Colonoscopy with propofol (N/A, 02/03/2017); Colon surgery (11/02/2016); IVC FILTER INSERTION (Right, 10/2016); Colostomy reversal (N/A, 02/24/2017); Appendectomy (N/A, 02/24/2017); Lysis of adhesion (N/A, 02/24/2017); laparoscopy (N/A, 02/24/2017); Ileo loop diversion (N/A, 02/24/2017); Sigmoidoscopy (N/A, 05/19/2017); Ileostomy closure (N/A, 06/01/2017); IVC FILTER REMOVAL (N/A, 08/09/2017); Sacroplasty (N/A, 11/08/2017); Cataract extraction w/PHACO (Left, 11/21/2018); and Cataract extraction w/PHACO (Right, 12/12/2018). Kara [name] has a current medication list which includes the following prescription(s): aspirin ec, calcium carbonate, calcium polycarbophil, candesartan, vitamin d3, cyclobenzaprine, dulaglutide, gabapentin, glipizide, hydrocodone-acetaminophen, hydrocodone-acetaminophen, hydrocodone-acetaminophen, loperamide, meclizine, multi-vitamins, nystatin cream, ropinirole, tolterodine, vitamin b-12, warfarin, and warfarin.  She  reports that she has never smoked. She has never used smokeless tobacco. She reports that she does not drink alcohol and does not use drugs. Kara [name] is allergic to adhesive [tape], other, latex, and morphine and related.   HPI  Today, she is being contacted for a post-procedure assessment.  The patient indicates having done great.  She will call when she needs me again.  Post-Procedure Evaluation  Procedure (  12/06/2019): Palliative bilateral lumbar facet block under fluoroscopic guidance, no sedation Pre-procedure pain level: 10/10 Post-procedure: 3/10 Limited initial benefit, possibly due to rapid discharge after no sedation procedure, without enough time to allow full onset of block.  Sedation: None.  Effectiveness during initial hour after procedure(Ultra-Short Term Relief): 100 %.  Local anesthetic used: Long-acting (4-6 hours) Effectiveness: Defined as any analgesic benefit obtained secondary to the administration of local anesthetics. This carries significant diagnostic value as to the etiological location, or anatomical origin, of the pain. Duration of benefit is expected to coincide with the duration of the local anesthetic used.  Effectiveness during initial 4-6 hours after procedure(Short-Term Relief): 100 %.  Long-term benefit: Defined as any relief past the pharmacologic duration of the local anesthetics.  Effectiveness past the initial 6 hours after procedure(Long-Term Relief):  85%.  Current benefits: Defined as benefit that persist at this time.   Analgesia:  >75% relief Function: Kara [name] reports improvement in function ROM: Kara [name] reports improvement in ROM  Pharmacotherapy Assessment  Analgesic: Hydrocodone/APAP 5/325, 1 tab PO QD PRN (5 mg/day of hydrocodone) MME: 5 mg/day.   Monitoring: [place] PMP: PDMP reviewed during this encounter.       Pharmacotherapy: No side-effects or adverse reactions reported. Compliance: No problems  identified. Effectiveness: Clinically acceptable. Plan: Refer to "POC".  UDS:  Summary  Date Value Ref Range Status  09/20/2019 Note  Final    Comment:    ==================================================================== [employer] Select 13 (MW) ==================================================================== Test                             Result       Flag       Units Drug Absent but Declared for Prescription Verification   Hydrocodone                    Not Detected UNEXPECTED ng/mg creat ==================================================================== Test                      Result    Flag   Units      Ref Range   Creatinine              126              mg/dL      >=20 ==================================================================== Declared Medications:  The flagging and interpretation on this report are based on the  following declared medications.  Unexpected results may arise from  inaccuracies in the declared medications.  **Note: The testing scope of this panel includes these medications:  Hydrocodone  **Note: The testing scope of this panel does not include the  following reported medications:  Acetaminophen  Aspirin  Calcium  Candesartan  Cholecalciferol  Cyanocobalamin  Cyclobenzaprine  Dulaglutide  Gabapentin  Glipizide  Loperamide  Meclizine  Multivitamin  Nystatin  Ropinirole  Warfarin ==================================================================== For clinical consultation, please call [phone]. ====================================================================     Laboratory Chemistry Profile   Renal Lab Results  Component Value Date   BUN 10 07/09/2019   CREATININE 0.63 07/09/2019   GFRAA >60 07/09/2019   GFRNONAA >60 07/09/2019     Hepatic Lab Results  Component Value Date   AST 38 07/09/2019   ALT 43 07/09/2019   ALBUMIN 3.6 07/09/2019   ALKPHOS 51 07/09/2019   LIPASE 23 07/09/2019      Electrolytes Lab Results  Component Value Date   NA 139 07/09/2019  K 3.7 07/09/2019   CL 110 07/09/2019   CALCIUM 8.5 (L) 07/09/2019   MG 2.0 11/02/2017   PHOS 3.3 11/07/2016     Bone Lab Results  Component Value Date   25OHVITD1 25 (L) 11/02/2017   25OHVITD2 <1.0 11/02/2017   25OHVITD3 25 11/02/2017     Inflammation (CRP: Acute Phase) (ESR: Chronic Phase) Lab Results  Component Value Date   CRP 5.3 (H) 11/02/2017   ESRSEDRATE 12 11/02/2017       Note: Above Lab results reviewed.   Imaging  DG PAIN CLINIC C-ARM 1-60 MIN NO REPORT Fluoro was used, but no Radiologist interpretation will be provided.  Please refer to "NOTES" tab for provider progress note.  Assessment  The primary encounter diagnosis was Chronic pain syndrome. Diagnoses of Chronic low back pain (1ry area of Pain) (Bilateral) w/o sciatica and Lumbar facet syndrome (Bilateral) were also pertinent to this visit.  Plan of Care  Problem-specific:  No problem-specific Assessment & Plan notes found for this encounter.  Kara [name] has a current medication list which includes the following long-term medication(s): calcium carbonate, candesartan, glipizide, hydrocodone-acetaminophen, hydrocodone-acetaminophen, hydrocodone-acetaminophen, ropinirole, warfarin, and warfarin.  Pharmacotherapy (Medications Ordered): No orders of the defined types were placed in this encounter.  Orders:  No orders of the defined types were placed in this encounter.  Follow-up plan:   No follow-ups on file.      Interventional management options:  Considering:   NOTE:COUMADINANTICOAGULATION(Stop:5days   Re-start: 2hrs) NO RFAuntil BMIis <35. Diagnostic L5-S1 LESI Diagnostic bilateral L3TFESI Diagnostic bilateral L4TFESI Diagnostic bilateral L5 TFESI Diagnostic left SI joint block #1  Possible righ SI joint RFA Possible bilateral lumbar facet RFA(NO RFAuntil BMIis <35.) Diagnostic right  IA hip joint injection Diagnostic right femoral +ObturatorNB Possible right femoral + obturator nerve RFA   Palliative PRN treatment(s):   Palliative/Therapeutic left L4-5 LESI #2 Palliative left lumbar facet block#6 Palliativeright lumbar facet block(s) #10 Palliative right SI joint block #4(not as effective as facet blocks) Diagnostic left SI joint block #2 (not as effective in controlling the low back pain as the facet injections) Palliative/Therapeutic (Midline) caudalESI#4       Recent Visits Date Type Provider Dept  12/06/19 Procedure visit Milinda Pointer, MD [name]  10/31/19 Telemedicine Milinda Pointer, MD [name]  10/16/19 Procedure visit Milinda Pointer, MD [name]  Showing recent visits within past 90 days and meeting all other requirements Today's Visits Date Type Provider Dept  12/19/19 Telemedicine Milinda Pointer, MD [name]  Showing today's visits and meeting all other requirements Future Appointments Date Type Provider Dept  12/31/19 Appointment Milinda Pointer, MD [name]  Showing future appointments within next 90 days and meeting all other requirements  I discussed the assessment and treatment plan with the patient. The patient was provided an opportunity to ask questions and all were answered. The patient agreed with the plan and demonstrated an understanding of the instructions.  Patient advised to call back or seek an in-person evaluation if the symptoms or condition worsens.  Duration of encounter: 7 minutes.  Note by: Gaspar Cola, MD Date: 12/19/2019; Time: 3:55 PM

## 2019-12-18 NOTE — Telephone Encounter (Signed)
LM for patient to call us for pre virtual appointment questions.

## 2019-12-19 ENCOUNTER — Other Ambulatory Visit: Payer: Self-pay

## 2019-12-19 ENCOUNTER — Ambulatory Visit: Payer: PPO | Attending: Pain Medicine | Admitting: Pain Medicine

## 2019-12-19 ENCOUNTER — Telehealth: Payer: Self-pay

## 2019-12-19 DIAGNOSIS — M545 Low back pain, unspecified: Secondary | ICD-10-CM

## 2019-12-19 DIAGNOSIS — M47816 Spondylosis without myelopathy or radiculopathy, lumbar region: Secondary | ICD-10-CM

## 2019-12-19 DIAGNOSIS — G894 Chronic pain syndrome: Secondary | ICD-10-CM

## 2019-12-19 DIAGNOSIS — G8929 Other chronic pain: Secondary | ICD-10-CM

## 2019-12-25 DIAGNOSIS — G2581 Restless legs syndrome: Secondary | ICD-10-CM | POA: Diagnosis not present

## 2019-12-25 DIAGNOSIS — Z7901 Long term (current) use of anticoagulants: Secondary | ICD-10-CM | POA: Diagnosis not present

## 2019-12-25 DIAGNOSIS — Z6841 Body Mass Index (BMI) 40.0 and over, adult: Secondary | ICD-10-CM | POA: Diagnosis not present

## 2019-12-25 DIAGNOSIS — E1165 Type 2 diabetes mellitus with hyperglycemia: Secondary | ICD-10-CM | POA: Diagnosis not present

## 2019-12-25 DIAGNOSIS — Z0000 Encounter for general adult medical examination without abnormal findings: Secondary | ICD-10-CM | POA: Diagnosis not present

## 2019-12-25 DIAGNOSIS — R8290 Unspecified abnormal findings in urine: Secondary | ICD-10-CM | POA: Diagnosis not present

## 2019-12-25 DIAGNOSIS — M5416 Radiculopathy, lumbar region: Secondary | ICD-10-CM | POA: Diagnosis not present

## 2019-12-25 DIAGNOSIS — M5136 Other intervertebral disc degeneration, lumbar region: Secondary | ICD-10-CM | POA: Diagnosis not present

## 2019-12-25 DIAGNOSIS — B9620 Unspecified Escherichia coli [E. coli] as the cause of diseases classified elsewhere: Secondary | ICD-10-CM | POA: Diagnosis not present

## 2019-12-25 DIAGNOSIS — M4310 Spondylolisthesis, site unspecified: Secondary | ICD-10-CM | POA: Diagnosis not present

## 2019-12-25 DIAGNOSIS — N390 Urinary tract infection, site not specified: Secondary | ICD-10-CM | POA: Diagnosis not present

## 2019-12-29 NOTE — Progress Notes (Deleted)
No-show today. She had her last prescription filled on 10/20/2019.  This prescription was 1 of 3 that I had written on 08/16/2019.  There is an additional prescription written on 09/19/2019 that has also not been filled.  Call [facility] and cancel all unused prescriptions.  According to my calculations, she has been using 20 pills every 70 days, which is an average of 0.28571 pills/day.  She is certainly not abusing her medicines.  If we multiply this by 30 we have that she is using approximately 8.6 pills/month or 26 pills per 90 days. Transfer: Calcium carbonate (calcium 600) 600 mg, 2 tabs p.o. daily (#60/month).  Patient should have enough to last until 03/30/2020

## 2019-12-31 ENCOUNTER — Ambulatory Visit: Payer: PPO | Admitting: Pain Medicine

## 2020-01-01 ENCOUNTER — Ambulatory Visit: Payer: PPO | Admitting: Pain Medicine

## 2020-01-01 DIAGNOSIS — Z86711 Personal history of pulmonary embolism: Secondary | ICD-10-CM | POA: Diagnosis not present

## 2020-01-01 DIAGNOSIS — Z780 Asymptomatic menopausal state: Secondary | ICD-10-CM | POA: Diagnosis not present

## 2020-01-01 DIAGNOSIS — G894 Chronic pain syndrome: Secondary | ICD-10-CM | POA: Diagnosis not present

## 2020-01-01 DIAGNOSIS — E1165 Type 2 diabetes mellitus with hyperglycemia: Secondary | ICD-10-CM | POA: Diagnosis not present

## 2020-01-01 DIAGNOSIS — E1142 Type 2 diabetes mellitus with diabetic polyneuropathy: Secondary | ICD-10-CM | POA: Diagnosis not present

## 2020-01-01 DIAGNOSIS — I10 Essential (primary) hypertension: Secondary | ICD-10-CM | POA: Diagnosis not present

## 2020-01-01 DIAGNOSIS — K5730 Diverticulosis of large intestine without perforation or abscess without bleeding: Secondary | ICD-10-CM | POA: Diagnosis not present

## 2020-01-01 DIAGNOSIS — Z6839 Body mass index (BMI) 39.0-39.9, adult: Secondary | ICD-10-CM | POA: Diagnosis not present

## 2020-01-01 DIAGNOSIS — M48061 Spinal stenosis, lumbar region without neurogenic claudication: Secondary | ICD-10-CM | POA: Diagnosis not present

## 2020-01-04 DIAGNOSIS — M810 Age-related osteoporosis without current pathological fracture: Secondary | ICD-10-CM | POA: Diagnosis not present

## 2020-01-07 DIAGNOSIS — Z03818 Encounter for observation for suspected exposure to other biological agents ruled out: Secondary | ICD-10-CM | POA: Diagnosis not present

## 2020-01-07 DIAGNOSIS — Z20822 Contact with and (suspected) exposure to covid-19: Secondary | ICD-10-CM | POA: Diagnosis not present

## 2020-01-09 DIAGNOSIS — E1142 Type 2 diabetes mellitus with diabetic polyneuropathy: Secondary | ICD-10-CM | POA: Diagnosis not present

## 2020-01-09 DIAGNOSIS — E1165 Type 2 diabetes mellitus with hyperglycemia: Secondary | ICD-10-CM | POA: Diagnosis not present

## 2020-01-09 DIAGNOSIS — G894 Chronic pain syndrome: Secondary | ICD-10-CM | POA: Diagnosis not present

## 2020-01-09 DIAGNOSIS — Z86711 Personal history of pulmonary embolism: Secondary | ICD-10-CM | POA: Diagnosis not present

## 2020-01-09 DIAGNOSIS — I10 Essential (primary) hypertension: Secondary | ICD-10-CM | POA: Diagnosis not present

## 2020-01-16 ENCOUNTER — Other Ambulatory Visit: Payer: Self-pay

## 2020-01-16 ENCOUNTER — Ambulatory Visit: Payer: PPO | Attending: Pain Medicine | Admitting: Pain Medicine

## 2020-01-16 ENCOUNTER — Encounter: Payer: Self-pay | Admitting: Pain Medicine

## 2020-01-16 VITALS — BP 139/82 | HR 85 | Temp 97.2°F | Resp 18 | Ht 63.0 in | Wt 219.0 lb

## 2020-01-16 DIAGNOSIS — M545 Low back pain: Secondary | ICD-10-CM | POA: Insufficient documentation

## 2020-01-16 DIAGNOSIS — M4310 Spondylolisthesis, site unspecified: Secondary | ICD-10-CM | POA: Insufficient documentation

## 2020-01-16 DIAGNOSIS — M79604 Pain in right leg: Secondary | ICD-10-CM | POA: Insufficient documentation

## 2020-01-16 DIAGNOSIS — G894 Chronic pain syndrome: Secondary | ICD-10-CM | POA: Diagnosis not present

## 2020-01-16 DIAGNOSIS — M79605 Pain in left leg: Secondary | ICD-10-CM | POA: Insufficient documentation

## 2020-01-16 DIAGNOSIS — M48061 Spinal stenosis, lumbar region without neurogenic claudication: Secondary | ICD-10-CM | POA: Insufficient documentation

## 2020-01-16 DIAGNOSIS — M47816 Spondylosis without myelopathy or radiculopathy, lumbar region: Secondary | ICD-10-CM | POA: Insufficient documentation

## 2020-01-16 DIAGNOSIS — M4306 Spondylolysis, lumbar region: Secondary | ICD-10-CM | POA: Diagnosis not present

## 2020-01-16 DIAGNOSIS — E1142 Type 2 diabetes mellitus with diabetic polyneuropathy: Secondary | ICD-10-CM | POA: Insufficient documentation

## 2020-01-16 DIAGNOSIS — M5137 Other intervertebral disc degeneration, lumbosacral region: Secondary | ICD-10-CM | POA: Diagnosis not present

## 2020-01-16 DIAGNOSIS — G8929 Other chronic pain: Secondary | ICD-10-CM | POA: Diagnosis not present

## 2020-01-16 DIAGNOSIS — M4807 Spinal stenosis, lumbosacral region: Secondary | ICD-10-CM | POA: Diagnosis not present

## 2020-01-16 DIAGNOSIS — Z7901 Long term (current) use of anticoagulants: Secondary | ICD-10-CM | POA: Diagnosis not present

## 2020-01-16 NOTE — Patient Instructions (Addendum)
____________________________________________________________________________________________  Preparing for your procedure (without sedation)  Procedure appointments are limited to planned procedures: . No Prescription Refills. . No disability issues will be discussed. . No medication changes will be discussed.  Instructions: . Oral Intake: Do not eat or drink anything for at least 6 hours prior to your procedure. (Exception: Blood Pressure Medication. See below.) . Transportation: Unless otherwise stated by your physician, you may drive yourself after the procedure. . Blood Pressure Medicine: Do not forget to take your blood pressure medicine with a sip of water the morning of the procedure. If your Diastolic (lower reading)is above 100 mmHg, elective cases will be cancelled/rescheduled. . Blood thinners: These will need to be stopped for procedures. Notify our staff if you are taking any blood thinners. Depending on which one you take, there will be specific instructions on how and when to stop it. . Diabetics on insulin: Notify the staff so that you can be scheduled 1st case in the morning. If your diabetes requires high dose insulin, take only  of your normal insulin dose the morning of the procedure and notify the staff that you have done so. . Preventing infections: Shower with an antibacterial soap the morning of your procedure.  . Build-up your immune system: Take 1000 mg of Vitamin C with every meal (3 times a day) the day prior to your procedure. Marland Kitchen Antibiotics: Inform the staff if you have a condition or reason that requires you to take antibiotics before dental procedures. . Pregnancy: If you are pregnant, call and cancel the procedure. . Sickness: If you have a cold, fever, or any active infections, call and cancel the procedure. . Arrival: You must be in the facility at least 30 minutes prior to your scheduled procedure. . Children: Do not bring any children with you. . Dress  appropriately: Bring dark clothing that you would not mind if they get stained. . Valuables: Do not bring any jewelry or valuables.  Reasons to call and reschedule or cancel your procedure: (Following these recommendations will minimize the risk of a serious complication.) . Surgeries: Avoid having procedures within 2 weeks of any surgery. (Avoid for 2 weeks before or after any surgery). . Flu Shots: Avoid having procedures within 2 weeks of a flu shots or . (Avoid for 2 weeks before or after immunizations). . Barium: Avoid having a procedure within 7-10 days after having had a radiological study involving the use of radiological contrast. (Myelograms, Barium swallow or enema study). . Heart attacks: Avoid any elective procedures or surgeries for the initial 6 months after a "Myocardial Infarction" (Heart Attack). . Blood thinners: It is imperative that you stop these medications before procedures. Let us know if you if you take any blood thinner.  . Infection: Avoid procedures during or within two weeks of an infection (including chest colds or gastrointestinal problems). Symptoms associated with infections include: Localized redness, fever, chills, night sweats or profuse sweating, burning sensation when voiding, cough, congestion, stuffiness, runny nose, sore throat, diarrhea, nausea, vomiting, cold or Flu symptoms, recent or current infections. It is specially important if the infection is over the area that we intend to treat. Marland Kitchen Heart and lung problems: Symptoms that may suggest an active cardiopulmonary problem include: cough, chest pain, breathing difficulties or shortness of breath, dizziness, ankle swelling, uncontrolled high or unusually low blood pressure, and/or palpitations. If you are experiencing any of these symptoms, cancel your procedure and contact your primary care physician for an evaluation.  Remember:  Regular  Business hours are:  Monday to Thursday 8:00 AM to 4:00  PM  Provider's Schedule: Milinda Pointer, MD:  Procedure days: Tuesday and Thursday 7:30 AM to 4:00 PM  Gillis Santa, MD:  Procedure days: Monday and Wednesday 7:30 AM to 4:00 PM ____________________________________________________________________________________________   ____________________________________________________________________________________________  Blood Thinners  IMPORTANT NOTICE:  If you take any of these, make sure to notify the nursing staff.  Failure to do so may result in injury.  Recommended time intervals to stop and restart blood-thinners, before & after invasive procedures  Generic Name Brand Name Stop Time. Must be stopped at least this long before procedures. After procedures, wait at least this long before re-starting.  Abciximab Reopro 15 days 2 hrs  Alteplase Activase 10 days 10 days  Anagrelide Agrylin    Apixaban Eliquis 3 days 6 hrs  Cilostazol Pletal 3 days 5 hrs  Clopidogrel Plavix 7-10 days 2 hrs  Dabigatran Pradaxa 5 days 6 hrs  Dalteparin Fragmin 24 hours 4 hrs  Dipyridamole Aggrenox 11days 2 hrs  Edoxaban Lixiana; Savaysa 3 days 2 hrs  Enoxaparin  Lovenox 24 hours 4 hrs  Eptifibatide Integrillin 8 hours 2 hrs  Fondaparinux  Arixtra 72 hours 12 hrs  Prasugrel Effient 7-10 days 6 hrs  Reteplase Retavase 10 days 10 days  Rivaroxaban Xarelto 3 days 6 hrs  Ticagrelor Brilinta 5-7 days 6 hrs  Ticlopidine Ticlid 10-14 days 2 hrs  Tinzaparin Innohep 24 hours 4 hrs  Tirofiban Aggrastat 8 hours 2 hrs  Warfarin Coumadin 5 days 2 hrs   Other medications with blood-thinning effects  Product indications Generic (Brand) names Note  Cholesterol Lipitor Stop 4 days before procedure  Blood thinner (injectable) Heparin (LMW or LMWH Heparin) Stop 24 hours before procedure  Cancer Ibrutinib (Imbruvica) Stop 7 days before procedure  Malaria/Rheumatoid Hydroxychloroquine (Plaquenil) Stop 11 days before procedure  Thrombolytics  10 days before or  after procedures   Over-the-counter (OTC) Products with blood-thinning effects  Product Common names Stop Time  Aspirin > 325 mg Goody Powders, Excedrin, etc. 11 days  Aspirin ? 81 mg  7 days  Fish oil  4 days  Garlic supplements  7 days  Ginkgo biloba  36 hours  Ginseng  24 hours  NSAIDs Ibuprofen, Naprosyn, etc. 3 days  Vitamin E  4 days   ____________________________________________________________________________________________  Preparing for your procedure (without sedation) Instructions: Oral Intake: Do not eat or drink anything for at least 3 hours prior to your procedure. Transportation: Unless otherwise stated by your physician, you may drive yourself after the procedure. Blood Pressure Medicine: Take your blood pressure medicine with a sip of water the morning of the procedure. Insulin: Take only  of your normal insulin dose. Preventing infections: Shower with an antibacterial soap the morning of your procedure. Build-up your immune system: Take 1000 mg of Vitamin C with every meal (3 times a day) the day prior to your procedure. Pregnancy: If you are pregnant, call and cancel the procedure. Sickness: If you have a cold, fever, or any active infections, call and cancel the procedure. Arrival: You must be in the facility at least 30 minutes prior to your scheduled procedure. Children: Do not bring any children with you. Dress appropriately: Bring dark clothing that you would not mind if they get stained. Valuables: Do not bring any jewelry or valuables. Procedure appointments are reserved for interventional treatments only. No Prescription Refills. No medication changes will be discussed during procedure appointments. No disability issues will be discussed.  STOP COUMADIN FOR 5 DAYS PRIOR TO PROCEDURE

## 2020-01-16 NOTE — Progress Notes (Signed)
Nursing Pain Medication Assessment:  Safety precautions to be maintained throughout the outpatient stay will include: orient to surroundings, keep bed in low position, maintain call bell within reach at all times, provide assistance with transfer out of bed and ambulation.  Medication Inspection Compliance: Kara [name] did not comply with our request to bring her pills to be counted. She was reminded that bringing the medication bottles, even when empty, is a requirement.  Medication: None brought in. Pill/Patch Count: None available to be counted. Bottle Appearance: No container available. Did not bring bottle(s) to appointment. Filled Date: N/A Last Medication intake:  Day before yesterday

## 2020-01-16 NOTE — Progress Notes (Addendum)
PROVIDER NOTE: Information contained herein reflects review and annotations entered in association with encounter. Interpretation of such information and data should be left to medically-trained personnel. Information provided to patient can be located elsewhere in the medical record under "Patient Instructions". Document created using STT-dictation technology, any transcriptional errors that may result from process are unintentional.    Patient: Kara [name]  Service Category: E/M  Provider: Gaspar Cola, MD  DOB: [birth year]  DOS: 01/16/2020  Specialty: Interventional Pain Management  MRN: [id number]  Setting: Ambulatory outpatient  PCP: Tracie Harrier, MD  Type: Established Patient    Referring Provider: Tracie Harrier, MD  Location: Office  Delivery: Face-to-face     HPI  Reason for encounter: [name], a 69 y.o. year old female, is here today for evaluation and management of her Chronic pain syndrome [G89.4]. [name] primary complain today is Back Pain (low) Last encounter: Practice (12/31/2019). My last encounter with her was on 12/31/2019. Pertinent problems: 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); Lumbar pars defect (L3 and L4) (Bilateral); Diabetic peripheral neuropathy ([place]); Chronic lower extremity pain (2ry area of Pain) (Bilateral); Chronic radicular pain of lower extremity; Osteoarthritis of hip  (Right); Chronic low back pain (1ry area of Pain) (Bilateral) w/o sciatica; Spondylosis without myelopathy or radiculopathy, lumbosacral region; Other specified dorsopathies, sacral and sacrococcygeal region; Secondary osteoarthritis of multiple sites; Sacral insufficiency fracture; DDD (degenerative disc disease), lumbosacral; Coccygodynia; Chronic sacroiliac joint pain (Left); Osteoarthritis of lumbar spine; Acute postoperative pain; Spondylolisthesis, lumbosacral region; and Abnormal MRI, lumbar spine (08/09/2019) on their pertinent problem list. Pain Assessment: Severity of Chronic pain is reported as a 9 /10. Location: Back Lower/radiates down the side of right leg to mid calf. Onset: More than a month ago. Quality: Sharp. Timing: Intermittent. Modifying factor(s): lying down. Vitals:  height is 5' 3" (1.6 m) and weight is 219 lb (99.3 kg). Her temperature is 97.2 F (36.2 C) (abnormal). Her blood pressure is 139/82 and her pulse is 85. Her respiration is 18 and oxygen saturation is 94%.   The patient comes into the clinic today indicating having an increase in her low back pain (Bilateral) (R>L).  In addition to this, she complains of right lower extremity pain through the posterior aspect of the leg to about mid calf.  Today she is positive for hyperextension on rotation triggering her low back pain, bilaterally (R>L).  In the past we had done some palliative bilateral lumbar facet blocks and the patient indicates that the pain is coming back and would like to have the procedure repeated.  As always, we keep reminded the patient importance of bringing her weight down, ideally to a BMI of 30 kg/m (170 pounds), but at least to a BMI of 35 kg/m (195 pounds), where we could safely and effectively do a radiofrequency ablation of her lumbar facets.  Pharmacotherapy Assessment   Analgesic: Hydrocodone/APAP 5/325, 1 tab PO QD PRN (5 mg/day of hydrocodone) MME: 5 mg/day.   Monitoring:  PMP: PDMP  reviewed during this encounter.  Pharmacotherapy: No side-effects or adverse reactions reported. Compliance: No problems identified. Effectiveness: Clinically acceptable.  Dewayne Shorter, RN  01/16/2020 10:22 AM  Signed Nursing Pain Medication Assessment:  Safety precautions to be maintained throughout the outpatient stay will include: orient to surroundings, keep bed in low position, maintain call bell within reach at all times, provide assistance with transfer out of bed and ambulation.  Medication Inspection Compliance: [name] did not comply with our request to bring her pills to be counted. She was reminded that bringing the medication bottles, even when empty, is a requirement.  Medication: None brought in. Pill/Patch Count: None available to be counted. Bottle Appearance: No container available. Did not bring bottle(s) to appointment. Filled Date: N/A Last Medication intake:  Day before yesterday    UDS:  Summary  Date Value Ref Range Status  09/20/2019 Note  Final    Comment:    ==================================================================== [employer] [facility] 13 (MW) ==================================================================== Test                             Result       Flag       Units Drug Absent but Declared for Prescription Verification   Hydrocodone                    Not Detected UNEXPECTED ng/mg creat ==================================================================== Test                      Result    Flag   Units      Ref Range   Creatinine              126              mg/dL      >=20 ==================================================================== Declared Medications:  The flagging and interpretation on this report are based on the  following declared medications.  Unexpected results may arise from  inaccuracies in the declared medications.  **Note: The testing scope of this panel includes these medications:  Hydrocodone   **Note: The testing scope of this panel does not include the  following reported medications:  Acetaminophen  Aspirin  Calcium  Candesartan  Cholecalciferol  Cyanocobalamin  Cyclobenzaprine  Dulaglutide  Gabapentin  Glipizide  Loperamide  Meclizine  Multivitamin  Nystatin  Ropinirole  Warfarin ==================================================================== For clinical consultation, please call [phone]. ====================================================================      ROS  Constitutional: Denies any fever or chills Gastrointestinal: No reported hemesis, hematochezia, vomiting, or acute GI distress Musculoskeletal: Denies any acute onset joint swelling, redness, loss of ROM, or weakness Neurological: No reported episodes of acute onset apraxia, aphasia, dysarthria, agnosia, amnesia, paralysis, loss of coordination, or loss of consciousness  Medication Review  Calcium Polycarbophil, HYDROcodone-acetaminophen, Multi-Vitamins, Vitamin D3, aspirin EC, calcium carbonate, candesartan, cyclobenzaprine, gabapentin, glipiZIDE, meclizine, nystatin cream, rOPINIRole, tolterodine, vitamin B-12, and warfarin  History Review  Allergy: [name] is allergic to adhesive [tape], other, latex, and morphine and related. Drug: [name]  reports no history of drug use. Alcohol:  reports no history of alcohol use. Tobacco:  reports that she has never smoked. She has never used smokeless tobacco. Social: Ms. Smead  reports that she has never smoked. She has never used smokeless tobacco. She reports that she does not drink alcohol and does not use drugs. 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 Surgical: Ms. Gerbino  has a past surgical history that includes Knee surgery; Carpal tunnel release (Bilateral); Colon resection sigmoid (N/A, 11/02/2016); Colostomy (N/A, 11/02/2016); Sigmoidoscopy (N/A, 11/13/2016); PULMONARY VENOGRAPHY (N/A, 11/19/2016); Colonoscopy with propofol (N/A, 02/03/2017); Colon surgery (11/02/2016); IVC FILTER INSERTION (Right, 10/2016); Colostomy reversal (N/A, 02/24/2017); Appendectomy (N/A, 02/24/2017); Lysis of adhesion (N/A, 02/24/2017); laparoscopy (N/A, 02/24/2017); Ileo loop diversion (N/A, 02/24/2017); Sigmoidoscopy (N/A, 05/19/2017); Ileostomy closure (N/A, 06/01/2017); IVC FILTER REMOVAL (N/A, 08/09/2017); Sacroplasty (N/A, 11/08/2017); Cataract extraction w/PHACO (Left, 11/21/2018); and Cataract extraction w/PHACO (Right, 12/12/2018). Family: family history includes Breast cancer in her mother; Cancer in her mother; Diabetes in her mother and sister; Heart disease in her father, mother, and sister.  Laboratory Chemistry Profile   Renal Lab Results  Component Value Date   BUN 10 07/09/2019   CREATININE 0.63 07/09/2019   GFRAA >60 07/09/2019   GFRNONAA >60 07/09/2019     Hepatic Lab Results  Component Value Date   AST 38 07/09/2019   ALT 43 07/09/2019   ALBUMIN 3.6 07/09/2019   ALKPHOS 51 07/09/2019   LIPASE 23 07/09/2019     Electrolytes Lab Results  Component Value Date   NA 139 07/09/2019   K 3.7 07/09/2019   CL 110 07/09/2019   CALCIUM 8.5 (L) 07/09/2019   MG 2.0 11/02/2017   PHOS 3.3 11/07/2016     Bone Lab Results  Component Value Date   25OHVITD1 25 (L) 11/02/2017   25OHVITD2 <1.0 11/02/2017   25OHVITD3 25 11/02/2017     Inflammation (CRP: Acute Phase) (ESR: Chronic Phase) Lab Results  Component Value Date   CRP 5.3 (H) 11/02/2017   ESRSEDRATE 12 11/02/2017       Note: Above Lab results reviewed.  Recent Imaging Review  DG PAIN CLINIC C-ARM 1-60 MIN NO REPORT Fluoro was used, but no Radiologist  interpretation will be provided.  Please refer to "NOTES" tab for provider progress note. Note: Reviewed        Physical Exam  General appearance: Well nourished, well developed, and well hydrated. In no apparent acute distress Mental status: Alert, oriented x 3 (person, place, & time)       Respiratory: No evidence of acute respiratory distress Eyes: PERLA Vitals: BP 139/82   Pulse 85   Temp (!) 97.2 F (36.2 C)   Resp 18   Ht 5' 3" (1.6 m)   Wt 219 lb (99.3 kg)   SpO2 94%   BMI 38.79 kg/m  BMI: Estimated body mass index is 38.79 kg/m as calculated from the following:   Height as of this encounter: 5' 3" (1.6 m).   Weight as of this encounter: 219 lb (99.3 kg). Ideal: Ideal body weight: 52.4 kg (115 lb 8.3 oz) Adjusted ideal body weight: 71.2 kg (156 lb 14.6 oz)  Assessment   Status Diagnosis  Controlled Controlled Controlled 1. Chronic pain syndrome   2. Chronic low back pain (1ry area of Pain) (Bilateral) w/o sciatica   3. Chronic lower extremity pain (2ry area of Pain) (Bilateral)   4. DDD (degenerative disc disease), lumbosacral   5. Diabetic peripheral neuropathy ([place])   6. Grade 1-2 Anterolisthesis of L4/L5 & L5/S1   7. Lumbar facet syndrome (Bilateral)   8. Lumbar facet arthropathy (L3-4, L4-5, and L5-S1) (Bilateral)   9. Lumbar foraminal stenosis (L3-4 and L4-5) (Bilateral)   10. Lumbar pars defect (L3 and  L4) (Bilateral)   11. Lumbosacral L5-S1 subarticular lateral recess stenosis (Bilateral)   12. Chronic anticoagulation ([place])      Updated Problems: No problems updated.  Plan of Care  Problem-specific:  No problem-specific Assessment & Plan notes found for this encounter.  [name] has a current medication list which includes the following long-term medication(s): calcium carbonate, candesartan, glipizide, hydrocodone-acetaminophen, ropinirole, warfarin, and warfarin.  Pharmacotherapy (Medications Ordered): No orders of the  defined types were placed in this encounter.  Orders:  Orders Placed This Encounter  Procedures  . LUMBAR FACET(MEDIAL BRANCH NERVE BLOCK) MBNB    Standing Status:   Future    Standing Expiration Date:   02/16/2020    Scheduling Instructions:     Procedure: Lumbar facet block (AKA.: Lumbosacral medial branch nerve block)     Side: Bilateral     Level: L3-4, L4-5, & L5-S1 Facets (L2, L3, L4, L5, & S1 Medial Branch Nerves)     Sedation: No Sedation.     Timeframe: ASAA    Order Specific Question:   Where will this procedure be performed?    Answer:   [name]  . Blood Thinner Instructions to Nursing    If the patient requires a Lovenox-bridge therapy, make sure arrangements are made to institute it with the assistance of the PCP.    Standing Status:   Standing    Number of Occurrences:   36    Standing Expiration Date:   01/15/2021    Scheduling Instructions:     Always stop the Coumadin (Warfarin) X 5 days prior to procedure or surgery.   Follow-up plan:   Return for Procedure (no sedation): (R) vs (B) L-FCT BLK, (Blood Thinner Protocol).      Interventional management options:  Considering:   NOTE:COUMADINANTICOAGULATION(Stop:5days  Re-start: 2hrs) NO RFAuntil BMIis <35. Diagnostic L5-S1 LESI Diagnostic bilateral L3TFESI Diagnostic bilateral L4TFESI Diagnostic bilateral L5 TFESI Diagnostic left SI joint block #1  Possible righ SI joint RFA Possible bilateral lumbar facet RFA(NO RFAuntil BMIis <35.) Diagnostic right IA hip joint injection Diagnostic right femoral +ObturatorNB Possible right femoral + obturator nerve RFA   Palliative PRN treatment(s):   Palliative/Therapeutic left L4-5 LESI #2 Palliative left lumbar facet block#6 Palliativeright lumbar facet block(s) #10 Palliative right SI joint block #4(not as effective as facet blocks) Diagnostic left SI joint block #2 (not as effective in controlling the low back pain as the  facet injections) Palliative/Therapeutic (Midline) caudalESI#4    Recent Visits Date Type Provider Dept  01/16/20 Office Visit Milinda Pointer, MD [name]  12/19/19 Telemedicine Milinda Pointer, MD [name]  12/06/19 Procedure visit Milinda Pointer, MD [name]  10/31/19 Telemedicine Milinda Pointer, MD [name]  Showing recent visits within past 90 days and meeting all other requirements Future Appointments Date Type Provider Dept  01/29/20 Appointment Milinda Pointer, MD [name]  Showing future appointments within next 90 days and meeting all other requirements  I discussed the assessment and treatment plan with the patient. The patient was provided an opportunity to ask questions and all were answered. The patient agreed with the plan and demonstrated an understanding of the instructions.  Patient advised to call back or seek an in-person evaluation if the symptoms or condition worsens.  Duration of encounter: 30 minutes.  Note by: Gaspar Cola, MD Date: 01/16/2020; Time: 2:25 PM

## 2020-01-17 ENCOUNTER — Telehealth: Payer: Self-pay | Admitting: *Deleted

## 2020-01-17 NOTE — Telephone Encounter (Signed)
Spoke to Kara [name] and she states she just picked up Hydrocodone - apap qty of 20 on Sunday and would like refills called in.  I called and checked with [facility] and they do have 1 more Rx on file with DNF 11/05/19.  I called Kara [name] back and let her know that there is an Rx on file.  Told Kara [name] that she should talk to Dr Dossie Arbour either at her Procedure appt or at her f/up about getting refills for her pain medications.  Patient states that she should have enough if she still has one on file.

## 2020-01-23 DIAGNOSIS — Z7901 Long term (current) use of anticoagulants: Secondary | ICD-10-CM | POA: Diagnosis not present

## 2020-01-24 DIAGNOSIS — H43813 Vitreous degeneration, bilateral: Secondary | ICD-10-CM | POA: Diagnosis not present

## 2020-01-27 NOTE — Progress Notes (Signed)
PROVIDER NOTE: Information contained herein reflects review and annotations entered in association with encounter. Interpretation of such information and data should be left to medically-trained personnel. Information provided to patient can be located elsewhere in the medical record under "Patient Instructions". Document created using STT-dictation technology, any transcriptional errors that may result from process are unintentional.    Patient: Kara [name]  Service Category: Procedure  Provider: Gaspar Cola, MD  DOB: 04/08/1951  DOS: 01/29/2020  Location: [facility]  MRN: [id number]  Setting: Ambulatory - outpatient  Referring Provider: Tracie Harrier, MD  Type: Established Patient  Specialty: Interventional Pain Management  PCP: Tracie Harrier, MD   Primary Reason for Visit: Interventional Pain Management Treatment. CC: Back Pain (lower)  Procedure:          Anesthesia, Analgesia, Anxiolysis:  Type: Lumbar Facet, Medial Branch Block(s)          Primary Purpose: Palliative Region: Posterolateral Lumbosacral Spine Level: L2, L3, L4, L5, & S1 Medial Branch Level(s). Injecting these levels blocks the L3-4, L4-5, and L5-S1 lumbar facet joints. Laterality: Bilateral  Type: Local Anesthesia Indication(s): Analgesia         Route: Infiltration ([place]/IM) IV Access: Declined Sedation: Declined  Local Anesthetic: Lidocaine 1-2%  Position: Prone   Indications: 1. Lumbar facet syndrome (Bilateral)   2. Spondylosis without myelopathy or radiculopathy, lumbosacral region   3. Lumbar facet arthropathy (L3-4, L4-5, and L5-S1) (Bilateral)   4. 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 DDD (degenerative disc disease), lumbosacral   7. Chronic low back pain (1ry area of Pain) (Bilateral) w/o sciatica    Chronic anticoagulation (COUMADIN)    Morbid obesity with BMI of 40.0-44.9, adult (HCC)     History of allergy to latex    Pain Score: Pre-procedure: 10-Worst pain ever/10 Post-procedure: 0-No pain/10   Pre-op Assessment:  Kara [name] is a 69 y.o. (year old), female patient, seen today for interventional treatment. She  has a past surgical history that includes Knee surgery; Carpal tunnel release (Bilateral); Colon resection sigmoid (N/A, 11/02/2016); Colostomy (N/A, 11/02/2016); Sigmoidoscopy (N/A, 11/13/2016); PULMONARY VENOGRAPHY (N/A, 11/19/2016); Colonoscopy with propofol (N/A, 02/03/2017); Colon surgery (11/02/2016); IVC FILTER INSERTION (Right, 10/2016); Colostomy reversal (N/A, 02/24/2017); Appendectomy (N/A, 02/24/2017); Lysis of adhesion (N/A, 02/24/2017); laparoscopy (N/A, 02/24/2017); [place]o loop diversion (N/A, 02/24/2017); Sigmoidoscopy (N/A, 05/19/2017); Ileostomy closure (N/A, 06/01/2017); IVC FILTER REMOVAL (N/A, 08/09/2017); Sacroplasty (N/A, 11/08/2017); Cataract extraction w/PHACO (Left, 11/21/2018); and Cataract extraction w/PHACO (Right, 12/12/2018). Kara [name] has a current medication list which includes the following prescription(s): aspirin ec, calcium polycarbophil, candesartan, vitamin d3, cyclobenzaprine, gabapentin, gabapentin, glipizide, meclizine, multi-vitamins, nystatin cream, ropinirole, tolterodine, vitamin b-12, warfarin, warfarin, calcium carbonate, and hydrocodone-acetaminophen, and the following Facility-Administered Medications: ketorolac and orphenadrine. Her primarily concern today is the Back Pain (lower)  Initial Vital Signs:  Pulse/HCG Rate: 87  Temp: (!) 97.3 F (36.3 C) Resp: 16 BP: (!) 155/76 SpO2: 97 %  BMI: Estimated body mass index is 38.79 kg/m as calculated from the following:   Height as of this encounter: 5\' 3"  (1.6 m).   Weight as of this encounter: 219 lb (99.3 kg).  Risk Assessment: Allergies: Reviewed. She is allergic to adhesive [tape], other, latex, and morphine and related.  Allergy Precautions: None  required Coagulopathies: Reviewed. None identified.  Blood-thinner therapy: None at this time Active Infection(s): Reviewed. None identified. Ms. Gains is afebrile  Site Confirmation: Kara [name] was asked  to confirm the procedure and laterality before marking the site Procedure checklist: Completed Consent: Before the procedure and under the influence of no sedative(s), amnesic(s), or anxiolytics, the patient was informed of the treatment options, risks and possible complications. To fulfill our ethical and legal obligations, as recommended by the American Medical Association's Code of Ethics, I have informed the patient of my clinical impression; the nature and purpose of the treatment or procedure; the risks, benefits, and possible complications of the intervention; the alternatives, including doing nothing; the risk(s) and benefit(s) of the alternative treatment(s) or procedure(s); and the risk(s) and benefit(s) of doing nothing. The patient was provided information about the general risks and possible complications associated with the procedure. These may include, but are not limited to: failure to achieve desired goals, infection, bleeding, organ or nerve damage, allergic reactions, paralysis, and death. In addition, the patient was informed of those risks and complications associated to Spine-related procedures, such as failure to decrease pain; infection (i.e.: Meningitis, epidural or intraspinal abscess); bleeding (i.e.: epidural hematoma, subarachnoid hemorrhage, or any other type of intraspinal or peri-dural bleeding); organ or nerve damage (i.e.: Any type of peripheral nerve, nerve root, or spinal cord injury) with subsequent damage to sensory, motor, and/or autonomic systems, resulting in permanent pain, numbness, and/or weakness of one or several areas of the body; allergic reactions; (i.e.: anaphylactic reaction); and/or death. Furthermore, the patient was informed of  those risks and complications associated with the medications. These include, but are not limited to: allergic reactions (i.e.: anaphylactic or anaphylactoid reaction(s)); adrenal axis suppression; blood sugar elevation that in diabetics may result in ketoacidosis or comma; water retention that in patients with history of congestive heart failure may result in shortness of breath, pulmonary edema, and decompensation with resultant heart failure; weight gain; swelling or edema; medication-induced neural toxicity; particulate matter embolism and blood vessel occlusion with resultant organ, and/or nervous system infarction; and/or aseptic necrosis of one or more joints. Finally, the patient was informed that Medicine is not an exact science; therefore, there is also the possibility of unforeseen or unpredictable risks and/or possible complications that may result in a catastrophic outcome. The patient indicated having understood very clearly. We have given the patient no guarantees and we have made no promises. Enough time was given to the patient to ask questions, all of which were answered to the patient's satisfaction. Ms. Rainone has indicated that she wanted to continue with the procedure. Attestation: I, the ordering provider, attest that I have discussed with the patient the benefits, risks, side-effects, alternatives, likelihood of achieving goals, and potential problems during recovery for the procedure that I have provided informed consent. Date  Time: 01/29/2020  7:53 AM  Pre-Procedure Preparation:  Monitoring: As per clinic protocol. Respiration, ETCO2, SpO2, BP, heart rate and rhythm monitor placed and checked for adequate function Safety Precautions: Patient was assessed for positional comfort and pressure points before starting the procedure. Time-out: I initiated and conducted the "Time-out" before starting the procedure, as per protocol. The patient was asked to participate by  confirming the accuracy of the "Time Out" information. Verification of the correct person, site, and procedure were performed and confirmed by me, the nursing staff, and the patient. "Time-out" conducted as per Joint Commission's Universal Protocol (UP.01.01.01). Time: 0818  Description of Procedure:          Laterality: Bilateral. The procedure was performed in identical fashion on both sides. Levels:  L2, L3, L4, L5, & S1 Medial Branch Level(s) Area Prepped:  Posterior Lumbosacral Region DuraPrep (Iodine Povacrylex [0.7% available iodine] and Isopropyl Alcohol, 74% w/w) Safety Precautions: Aspiration looking for blood return was conducted prior to all injections. At no point did we inject any substances, as a needle was being advanced. Before injecting, the patient was told to immediately notify me if she was experiencing any new onset of "ringing in the ears, or metallic taste in the mouth". No attempts were made at seeking any paresthesias. Safe injection practices and needle disposal techniques used. Medications properly checked for expiration dates. SDV (single dose vial) medications used. After the completion of the procedure, all disposable equipment used was discarded in the proper designated medical waste containers. Local Anesthesia: Protocol guidelines were followed. The patient was positioned over the fluoroscopy table. The area was prepped in the usual manner. The time-out was completed. The target area was identified using fluoroscopy. A 12-in long, straight, sterile hemostat was used with fluoroscopic guidance to locate the targets for each level blocked. Once located, the skin was marked with an approved surgical skin marker. Once all sites were marked, the skin (epidermis, dermis, and hypodermis), as well as deeper tissues (fat, connective tissue and muscle) were infiltrated with a small amount of a short-acting local anesthetic, loaded on a 10cc syringe with a 25G, 1.5-in  Needle. An  appropriate amount of time was allowed for local anesthetics to take effect before proceeding to the next step. Local Anesthetic: Lidocaine 2.0% The unused portion of the local anesthetic was discarded in the proper designated containers. Technical explanation of process:  L2 Medial Branch Nerve Block (MBB): The target area for the L2 medial branch is at the junction of the postero-lateral aspect of the superior articular process and the superior, posterior, and medial edge of the transverse process of L3. Under fluoroscopic guidance, a Quincke needle was inserted until contact was made with os over the superior postero-lateral aspect of the pedicular shadow (target area). After negative aspiration for blood, 0.5 mL of the nerve block solution was injected without difficulty or complication. The needle was removed intact. L3 Medial Branch Nerve Block (MBB): The target area for the L3 medial branch is at the junction of the postero-lateral aspect of the superior articular process and the superior, posterior, and medial edge of the transverse process of L4. Under fluoroscopic guidance, a Quincke needle was inserted until contact was made with os over the superior postero-lateral aspect of the pedicular shadow (target area). After negative aspiration for blood, 0.5 mL of the nerve block solution was injected without difficulty or complication. The needle was removed intact. L4 Medial Branch Nerve Block (MBB): The target area for the L4 medial branch is at the junction of the postero-lateral aspect of the superior articular process and the superior, posterior, and medial edge of the transverse process of L5. Under fluoroscopic guidance, a Quincke needle was inserted until contact was made with os over the superior postero-lateral aspect of the pedicular shadow (target area). After negative aspiration for blood, 0.5 mL of the nerve block solution was injected without difficulty or complication. The needle was  removed intact. L5 Medial Branch Nerve Block (MBB): The target area for the L5 medial branch is at the junction of the postero-lateral aspect of the superior articular process and the superior, posterior, and medial edge of the sacral ala. Under fluoroscopic guidance, a Quincke needle was inserted until contact was made with os over the superior postero-lateral aspect of the pedicular shadow (target area). After negative aspiration for blood, 0.5  mL of the nerve block solution was injected without difficulty or complication. The needle was removed intact. S1 Medial Branch Nerve Block (MBB): The target area for the S1 medial branch is at the posterior and inferior 6 o'clock position of the L5-S1 facet joint. Under fluoroscopic guidance, the Quincke needle inserted for the L5 MBB was redirected until contact was made with os over the inferior and postero aspect of the sacrum, at the 6 o' clock position under the L5-S1 facet joint (Target area). After negative aspiration for blood, 0.5 mL of the nerve block solution was injected without difficulty or complication. The needle was removed intact.  Nerve block solution: 0.2% PF-Ropivacaine + Triamcinolone (40 mg/mL) diluted to a final concentration of 4 mg of Triamcinolone/mL of Ropivacaine The unused portion of the solution was discarded in the proper designated containers. Procedural Needles: 22-gauge, 3.5-inch, Quincke needles used for all levels.  Once the entire procedure was completed, the treated area was cleaned, making sure to leave some of the prepping solution back to take advantage of its long term bactericidal properties.   Illustration of the posterior view of the lumbar spine and the posterior neural structures. Laminae of L2 through S1 are labeled. DPRL5, dorsal primary ramus of L5; DPRS1, dorsal primary ramus of S1; DPR3, dorsal primary ramus of L3; FJ, facet (zygapophyseal) joint L3-L4; I, inferior articular process of L4; LB1, lateral branch  of dorsal primary ramus of L1; IAB, inferior articular branches from L3 medial branch (supplies L4-L5 facet joint); IBP, intermediate branch plexus; MB3, medial branch of dorsal primary ramus of L3; NR3, third lumbar nerve root; S, superior articular process of L5; SAB, superior articular branches from L4 (supplies L4-5 facet joint also); TP3, transverse process of L3.  Vitals:   01/29/20 0815 01/29/20 0825 01/29/20 0835 01/29/20 0838  BP: (!) 171/89 (!) 153/73 136/69 (!) 142/70  Pulse: 88 84 88 86  Resp: (!) 26 (!) 22 20 16   Temp:      SpO2: 96% 94% 96% 97%  Weight:      Height:         Start Time: 0822 hrs. End Time: 0835 hrs.  Imaging Guidance (Spinal):          Type of Imaging Technique: Fluoroscopy Guidance (Spinal) Indication(s): Assistance in needle guidance and placement for procedures requiring needle placement in or near specific anatomical locations not easily accessible without such assistance. Exposure Time: Please see nurses notes. Contrast: None used. Fluoroscopic Guidance: I was personally present during the use of fluoroscopy. "Tunnel Vision Technique" used to obtain the best possible view of the target area. Parallax error corrected before commencing the procedure. "Direction-depth-direction" technique used to introduce the needle under continuous pulsed fluoroscopy. Once target was reached, antero-posterior, oblique, and lateral fluoroscopic projection used confirm needle placement in all planes. Images permanently stored in EMR. Interpretation: No contrast injected. I personally interpreted the imaging intraoperatively. Adequate needle placement confirmed in multiple planes. Permanent images saved into the patient's record.  Antibiotic Prophylaxis:   Anti-infectives (From admission, onward)   None     Indication(s): None identified  Post-operative Assessment:  Post-procedure Vital Signs:  Pulse/HCG Rate: 86  Temp: (!) 97.3 F (36.3 C) Resp: 16 BP: (!)  142/70 SpO2: 97 %  EBL: None  Complications: No immediate post-treatment complications observed by team, or reported by patient.  Note: The patient tolerated the entire procedure well. A repeat set of vitals were taken after the procedure and the patient was kept under observation  following institutional policy, for this type of procedure. Post-procedural neurological assessment was performed, showing return to baseline, prior to discharge. The patient was provided with post-procedure discharge instructions, including a section on how to identify potential problems. Should any problems arise concerning this procedure, the patient was given instructions to immediately contact us, at any time, without hesitation. In any case, we plan to contact the patient by telephone for a follow-up status report regarding this interventional procedure.  Comments:  No additional relevant information.  Plan of Care  Orders:  Orders Placed This Encounter  Procedures  . LUMBAR FACET(MEDIAL BRANCH NERVE BLOCK) MBNB    Scheduling Instructions:     Procedure: Lumbar facet block (AKA.: Lumbosacral medial branch nerve block)     Side: Bilateral     Level: L3-4, L4-5, & L5-S1 Facets (L2, L3, L4, L5, & S1 Medial Branch Nerves)     Sedation: Patient's choice.     Timeframe: Today    Order Specific Question:   Where will this procedure be performed?    Answer:   [name]  . [facility] C-ARM 1-60 MIN NO REPORT    Intraoperative interpretation by procedural physician at [facility].    Standing Status:   Standing    Number of Occurrences:   1    Order Specific Question:   Reason for exam:    Answer:   Assistance in needle guidance and placement for procedures requiring needle placement in or near specific anatomical locations not easily accessible without such assistance.  . Informed Consent Details: Physician/Practitioner Attestation; Transcribe to consent form and obtain patient signature     Nursing Order: Transcribe to consent form and obtain patient signature. Note: Always confirm laterality of pain with Ms. Strine, before procedure. Procedure: Lumbar Facet Block  under fluoroscopic guidance Indication/Reason: Low Back Pain, with our without leg pain, due to Facet Joint Arthralgia (Joint Pain) known as Lumbar Facet Syndrome, secondary to Lumbar, and/or Lumbosacral Spondylosis (Arthritis of the Spine), without myelopathy or radiculopathy (Nerve Damage). Provider Attestation: I, [place] Dossie Arbour, MD, (Pain Management Specialist), the physician/practitioner, attest that I have discussed with the patient the benefits, risks, side effects, alternatives, likelihood of achieving goals and potential problems during recovery for the procedure that I have provided informed consent.  . Care order/instruction: Please confirm that the patient has stopped the Coumadin (Warfarin) X 5 days prior to procedure or surgery.    Please confirm that the patient has stopped the Coumadin (Warfarin) X 5 days prior to procedure or surgery.    Standing Status:   Standing    Number of Occurrences:   1  . Provide equipment / supplies at bedside    Equipment required: Single use, disposable, "Block Tray"    Standing Status:   Standing    Number of Occurrences:   1    Order Specific Question:   Specify    Answer:   Block Tray  . Bleeding precautions    Standing Status:   Standing    Number of Occurrences:   1  . Latex precautions    Activate Latex-Free Protocol.    Standing Status:   Standing    Number of Occurrences:   1   Chronic Opioid Analgesic:  Hydrocodone/APAP 5/325, 1 tab PO QD PRN (5 mg/day of hydrocodone) MME: 5 mg/day.   Medications ordered for procedure: Meds ordered this encounter  Medications  . lidocaine (XYLOCAINE) 2 % (with pres) injection 400 mg  . ropivacaine (  PF) 2 mg/mL (0.2%) (NAROPIN) injection 18 mL  . triamcinolone acetonide (KENALOG-40) injection 80 mg  .  ketorolac (TORADOL) injection 60 mg  . orphenadrine (NORFLEX) injection 60 mg   Medications administered: We administered lidocaine, ropivacaine (PF) 2 mg/mL (0.2%), and triamcinolone acetonide.  See the medical record for exact dosing, route, and time of administration.  Follow-up plan:   Return for (VV), (PP) Follow-up.       Interventional management options:  Considering:   NOTE:COUMADINANTICOAGULATION(Stop:5days  Re-start: 2hrs) NO RFAuntil BMIis <35. Diagnostic L5-S1 LESI Diagnostic bilateral L3TFESI Diagnostic bilateral L4TFESI Diagnostic bilateral L5 TFESI Diagnostic left SI joint block #1  Possible righ SI joint RFA Possible bilateral lumbar facet RFA(NO RFAuntil BMIis <35.) Diagnostic right IA hip joint injection Diagnostic right femoral +ObturatorNB Possible right femoral + obturator nerve RFA   Palliative PRN treatment(s):   Palliative/Therapeutic left L4-5 LESI #2 Palliative left lumbar facet block#6 Palliativeright lumbar facet block(s) #10 Palliative right SI joint block #4(not as effective as facet blocks) Diagnostic left SI joint block #2 (not as effective in controlling the low back pain as the facet injections) Palliative/Therapeutic (Midline) caudalESI#4     Recent Visits Date Type Provider Dept  01/16/20 Office Visit Milinda Pointer, MD [name]  12/19/19 Telemedicine Milinda Pointer, MD [name]  12/06/19 Procedure visit Milinda Pointer, MD [name]  10/31/19 Telemedicine Milinda Pointer, MD [name]  Showing recent visits within past 90 days and meeting all other requirements Today's Visits Date Type Provider Dept  01/29/20 Procedure visit Milinda Pointer, MD [name]  Showing today's visits and meeting all other requirements Future Appointments Date Type Provider Dept  02/25/20 Appointment Milinda Pointer, MD [name]   Showing future appointments within next 90 days and meeting all other requirements  Disposition: Discharge home  Discharge (Date  Time): 01/29/2020; 0839 hrs.   Primary Care Physician: Tracie Harrier, MD Location: [facility] Note by: Gaspar Cola, MD Date: 01/29/2020; Time: 8:49 AM  Disclaimer:  Medicine is not an Chief Strategy Officer. The only guarantee in medicine is that nothing is guaranteed. It is important to note that the decision to proceed with this intervention was based on the information collected from the patient. The Data and conclusions were drawn from the patient's questionnaire, the interview, and the physical examination. Because the information was provided in large part by the patient, it cannot be guaranteed that it has not been purposely or unconsciously manipulated. Every effort has been made to obtain as much relevant data as possible for this evaluation. It is important to note that the conclusions that lead to this procedure are derived in large part from the available data. Always take into account that the treatment will also be dependent on availability of resources and existing treatment guidelines, considered by other Pain Management Practitioners as being common knowledge and practice, at the time of the intervention. For Medico-Legal purposes, it is also important to point out that variation in procedural techniques and pharmacological choices are the acceptable norm. The indications, contraindications, technique, and results of the above procedure should only be interpreted and judged by a Board-Certified Interventional Pain Specialist with extensive familiarity and expertise in the same exact procedure and technique.

## 2020-01-27 NOTE — Patient Instructions (Signed)

## 2020-01-29 ENCOUNTER — Ambulatory Visit (HOSPITAL_BASED_OUTPATIENT_CLINIC_OR_DEPARTMENT_OTHER): Payer: PPO | Admitting: Pain Medicine

## 2020-01-29 ENCOUNTER — Other Ambulatory Visit: Payer: Self-pay

## 2020-01-29 ENCOUNTER — Encounter: Payer: Self-pay | Admitting: Pain Medicine

## 2020-01-29 ENCOUNTER — Ambulatory Visit
Admission: RE | Admit: 2020-01-29 | Discharge: 2020-01-29 | Disposition: A | Discharge status: Home / Self Care | Payer: PPO | Source: Ambulatory Visit | Attending: Pain Medicine | Admitting: Pain Medicine

## 2020-01-29 VITALS — BP 142/70 | HR 86 | Temp 97.3°F | Resp 16 | Ht 63.0 in | Wt 219.0 lb

## 2020-01-29 DIAGNOSIS — M5137 Other intervertebral disc degeneration, lumbosacral region: Secondary | ICD-10-CM

## 2020-01-29 DIAGNOSIS — M62830 Muscle spasm of back: Secondary | ICD-10-CM | POA: Insufficient documentation

## 2020-01-29 DIAGNOSIS — Z91040 Latex allergy status: Secondary | ICD-10-CM | POA: Diagnosis not present

## 2020-01-29 DIAGNOSIS — Z6841 Body Mass Index (BMI) 40.0 and over, adult: Secondary | ICD-10-CM | POA: Insufficient documentation

## 2020-01-29 DIAGNOSIS — Z7901 Long term (current) use of anticoagulants: Secondary | ICD-10-CM | POA: Diagnosis not present

## 2020-01-29 DIAGNOSIS — M47816 Spondylosis without myelopathy or radiculopathy, lumbar region: Secondary | ICD-10-CM | POA: Diagnosis not present

## 2020-01-29 DIAGNOSIS — G8929 Other chronic pain: Secondary | ICD-10-CM

## 2020-01-29 DIAGNOSIS — M545 Low back pain: Secondary | ICD-10-CM | POA: Insufficient documentation

## 2020-01-29 DIAGNOSIS — M47817 Spondylosis without myelopathy or radiculopathy, lumbosacral region: Secondary | ICD-10-CM

## 2020-01-29 DIAGNOSIS — M4310 Spondylolisthesis, site unspecified: Secondary | ICD-10-CM | POA: Insufficient documentation

## 2020-01-29 DIAGNOSIS — M4300 Spondylolysis, site unspecified: Secondary | ICD-10-CM | POA: Insufficient documentation

## 2020-01-29 MED ORDER — ORPHENADRINE CITRATE 30 MG/ML IJ SOLN
INTRAMUSCULAR | Status: AC
  Filled 2020-01-29: qty 2

## 2020-01-29 MED ORDER — TRIAMCINOLONE ACETONIDE 40 MG/ML IJ SUSP
INTRAMUSCULAR | Status: AC
  Filled 2020-01-29: qty 1

## 2020-01-29 MED ORDER — KETOROLAC TROMETHAMINE 60 MG/2ML IM SOLN
INTRAMUSCULAR | Status: AC
  Filled 2020-01-29: qty 2

## 2020-01-29 MED ORDER — ROPIVACAINE HCL 2 MG/ML IJ SOLN
18.0000 mL | Freq: Once | INTRAMUSCULAR | Status: AC
  Administered 2020-01-29: 18 mL via PERINEURAL

## 2020-01-29 MED ORDER — TRIAMCINOLONE ACETONIDE 40 MG/ML IJ SUSP
80.0000 mg | Freq: Once | INTRAMUSCULAR | Status: AC
  Administered 2020-01-29: 80 mg

## 2020-01-29 MED ORDER — LIDOCAINE HCL 2 % IJ SOLN
20.0000 mL | Freq: Once | INTRAMUSCULAR | Status: AC
  Administered 2020-01-29: 400 mg

## 2020-01-29 MED ORDER — ORPHENADRINE CITRATE 30 MG/ML IJ SOLN
60.0000 mg | Freq: Once | INTRAMUSCULAR | Status: AC
  Administered 2020-01-29: 60 mg via INTRAMUSCULAR

## 2020-01-29 MED ORDER — LIDOCAINE HCL 2 % IJ SOLN
INTRAMUSCULAR | Status: AC
  Filled 2020-01-29: qty 10

## 2020-01-29 MED ORDER — KETOROLAC TROMETHAMINE 60 MG/2ML IM SOLN
60.0000 mg | Freq: Once | INTRAMUSCULAR | Status: AC
  Administered 2020-01-29: 60 mg via INTRAMUSCULAR

## 2020-01-29 MED ORDER — ROPIVACAINE HCL 2 MG/ML IJ SOLN
INTRAMUSCULAR | Status: AC
  Filled 2020-01-29: qty 20

## 2020-01-29 NOTE — Progress Notes (Signed)
Safety precautions to be maintained throughout the outpatient stay will include: orient to surroundings, keep bed in low position, maintain call bell within reach at all times, provide assistance with transfer out of bed and ambulation. Toradol/ Norflex given when standing r/t spasm in back and down right leg. Patient is staying in the waiting room until better and can drive.

## 2020-01-30 ENCOUNTER — Telehealth: Payer: Self-pay

## 2020-01-30 NOTE — Telephone Encounter (Signed)
Post procedure phone call.  LM 

## 2020-01-30 NOTE — Telephone Encounter (Signed)
Patient called back and states she is dong OK. She is going to work today and is taing a heating pad with  Her. Instructed her to call if needed.

## 2020-02-18 ENCOUNTER — Other Ambulatory Visit: Payer: Self-pay | Admitting: Pain Medicine

## 2020-02-18 DIAGNOSIS — G894 Chronic pain syndrome: Secondary | ICD-10-CM

## 2020-02-19 ENCOUNTER — Telehealth: Payer: Self-pay

## 2020-02-19 ENCOUNTER — Other Ambulatory Visit: Payer: Self-pay | Admitting: Pain Medicine

## 2020-02-19 DIAGNOSIS — G894 Chronic pain syndrome: Secondary | ICD-10-CM

## 2020-02-19 NOTE — Telephone Encounter (Signed)
Spoke with pharmacist, there is a script from 07-2019 that is out of date, but he can change it so that it can be filled today. I asked him to go ahead and do that.

## 2020-02-19 NOTE — Telephone Encounter (Signed)
She states that [facility] says they cant fill her hydrocode because its 23 months old. She needs it now.

## 2020-02-20 ENCOUNTER — Encounter: Payer: Self-pay | Admitting: Pain Medicine

## 2020-02-20 ENCOUNTER — Telehealth: Payer: Self-pay

## 2020-02-20 NOTE — Telephone Encounter (Signed)
LM for patient to call office

## 2020-02-24 NOTE — Progress Notes (Signed)
Patient: Kara [name]  Service Category: E/M  Provider: Gaspar Cola, MD  DOB: 07/10/1950  DOS: 02/25/2020  Location: Office  MRN: [id number]  Setting: Ambulatory outpatient  Referring Provider: Tracie Harrier, MD  Type: Established Patient  Specialty: Interventional Pain Management  PCP: Tracie Harrier, MD  Location: Remote location  Delivery: TeleHealth     Virtual Encounter - Pain Management PROVIDER NOTE: Information contained herein reflects review and annotations entered in association with encounter. Interpretation of such information and data should be left to medically-trained personnel. Information provided to patient can be located elsewhere in the medical record under "Patient Instructions". Document created using STT-dictation technology, any transcriptional errors that may result from process are unintentional.    Contact & Pharmacy Preferred: [phone] Home: [phone] (home) Mobile: [phone] (mobile) E-mail: [email or web address]  [place], [place] [address] Phone: [phone] Fax: [phone]   Pre-screening  Ms. Ventola offered "in-person" vs "virtual" encounter. She indicated preferring virtual for this encounter.   Reason COVID-19*  Social distancing based on CDC and AMA recommendations.   I contacted [name] on 02/25/2020 via telephone.      I clearly identified myself as Gaspar Cola, MD. I verified that I was speaking with the correct person using two identifiers (Name: ABBIEGAIL LANDGREN, and date of birth: 1951/02/07).  Consent I sought verbal advanced consent from [name] for virtual visit interactions. I informed Ms. Pepitone of possible security and privacy concerns, risks, and limitations associated with providing "not-in-person" medical evaluation and management services. I also informed Ms. Epler of the  availability of "in-person" appointments. Finally, I informed her that there would be a charge for the virtual visit and that she could be  personally, fully or partially, financially responsible for it. Ms. Detlefsen expressed understanding and agreed to proceed.   Historic Elements   Ms. BRILYNN BIASI is a 69 y.o. year old, female patient evaluated today after our last contact on 02/19/2020. Ms. 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 She also  has a past surgical history that includes Knee surgery; Carpal tunnel release (Bilateral); Colon resection sigmoid (N/A, 11/02/2016); Colostomy (N/A, 11/02/2016); Sigmoidoscopy (N/A, 11/13/2016); PULMONARY VENOGRAPHY (N/A, 11/19/2016); Colonoscopy with propofol (N/A, 02/03/2017); Colon surgery (11/02/2016); IVC FILTER INSERTION (Right, 10/2016); Colostomy reversal (N/A, 02/24/2017); Appendectomy (N/A, 02/24/2017); Lysis of adhesion (N/A, 02/24/2017); laparoscopy (N/A, 02/24/2017); Ileo loop diversion (N/A, 02/24/2017); Sigmoidoscopy (N/A, 05/19/2017); Ileostomy closure (N/A, 06/01/2017); IVC FILTER REMOVAL (N/A, 08/09/2017); Sacroplasty (N/A, 11/08/2017); Cataract extraction w/PHACO (Left, 11/21/2018); and Cataract extraction w/PHACO (Right, 12/12/2018). Ms. Turney has a current medication list which includes the following prescription(s): aspirin ec, calcium polycarbophil, candesartan, vitamin d3, cyclobenzaprine, gabapentin, gabapentin, glipizide, meclizine, multi-vitamins, nystatin cream, ropinirole, tolterodine, vitamin b-12, warfarin, warfarin, calcium carbonate, and [START ON 03/14/2020] hydrocodone-acetaminophen. She  reports that she has never smoked. She has never used  smokeless tobacco. She reports that she does not drink alcohol and does not use drugs. [name] is allergic to adhesive [tape], other, latex, and morphine and related.   HPI  Today, she is being contacted for a post-procedure assessment.  Post-Procedure Evaluation  Procedure (02/19/2020): Palliative bilateral lumbar facet block under fluoroscopic guidance, no sedation Pre-procedure pain level: 10/10 Post-procedure: 0/10 (100% relief)  Sedation: None.  Effectiveness during initial hour after procedure(Ultra-Short Term Relief): 100 %.  Local anesthetic used: Long-acting (4-6 hours) Effectiveness: Defined as any analgesic benefit obtained secondary to the administration of local anesthetics. This carries significant diagnostic value as to the etiological location, or anatomical origin, of the pain. Duration of benefit is expected to coincide with the duration of the local anesthetic used.  Effectiveness during initial 4-6 hours after procedure(Short-Term Relief): 100 %.  Long-term benefit: Defined as any relief past the pharmacologic duration of the local anesthetics.  Effectiveness past the initial 6 hours after procedure(Long-Term Relief): 70 %.  Current benefits: Defined as benefit that persist at this time.   Analgesia:  >75% relief Function: Ms. Dezarn reports improvement in function ROM: Ms. Natt reports improvement in ROM  Pharmacotherapy Assessment  Analgesic: Hydrocodone/APAP 5/325, 1 tab PO QD PRN (5 mg/day of hydrocodone) MME: 5 mg/day.   Monitoring: [place] PMP: PDMP reviewed during this encounter.       Pharmacotherapy: No side-effects or adverse reactions reported. Compliance: No problems identified. Effectiveness: Clinically acceptable. Plan: Refer to "POC".  UDS:  Summary  Date Value Ref Range Status  09/20/2019 Note  Final    Comment:    ==================================================================== Tox[employer] Select 13  (MW) ==================================================================== Test                             Result       Flag       Units Drug Absent but Declared for Prescription Verification   Hydrocodone                    Not Detected UNEXPECTED ng/mg creat ==================================================================== Test                      Result    Flag   Units      Ref Range   Creatinine              126              mg/dL      >=20 ==================================================================== Declared Medications:  The flagging and interpretation on this report are based on the  following declared medications.  Unexpected results may arise from  inaccuracies in the declared medications.  **Note: The testing scope of this panel includes these medications:  Hydrocodone  **Note: The testing scope of this panel does not include the  following reported medications:  Acetaminophen  Aspirin  Calcium  Candesartan  Cholecalciferol  Cyanocobalamin  Cyclobenzaprine  Dulaglutide  Gabapentin  Glipizide  Loperamide  Meclizine  Multivitamin  Nystatin  Ropinirole  Warfarin ==================================================================== For clinical consultation, please call [phone]. ====================================================================     Laboratory Chemistry Profile   Renal Lab Results  Component Value Date   BUN 10 07/09/2019   CREATININE 0.63 07/09/2019   GFRAA >60 07/09/2019   GFRNONAA >60 07/09/2019     Hepatic Lab Results  Component Value Date   AST 38 07/09/2019   ALT 43 07/09/2019   ALBUMIN 3.6 07/09/2019   ALKPHOS 51 07/09/2019   LIPASE 23 07/09/2019     Electrolytes Lab Results  Component Value Date   NA 139 07/09/2019   K 3.7 07/09/2019   CL 110 07/09/2019   CALCIUM 8.5 (L) 07/09/2019   MG 2.0 11/02/2017   PHOS 3.3 11/07/2016     Bone Lab Results  Component Value Date   25OHVITD1 25 (L)  11/02/2017    25OHVITD2 <1.0 11/02/2017   25OHVITD3 25 11/02/2017     Inflammation (CRP: Acute Phase) (ESR: Chronic Phase) Lab Results  Component Value Date   CRP 5.3 (H) 11/02/2017   ESRSEDRATE 12 11/02/2017       Note: Above Lab results reviewed.  Imaging  DG PAIN CLINIC C-ARM 1-60 MIN NO REPORT Fluoro was used, but no Radiologist interpretation will be provided.  Please refer to "NOTES" tab for provider progress note.  Assessment  The primary encounter diagnosis was Chronic pain syndrome. 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  Plan of Care  Problem-specific:  No problem-specific Assessment & Plan notes found for this encounter.  Ms. EDDA OREA has a current medication list which includes the following long-term medication(s): candesartan, glipizide, ropinirole, warfarin, warfarin, calcium carbonate, and [START ON 03/14/2020] hydrocodone-acetaminophen.  Pharmacotherapy (Medications Ordered): Meds ordered this encounter  Medications  . calcium carbonate (CALCIUM 600) 600 MG TABS tablet    Sig: Take 1 tablet (600 mg total) by mouth 2 (two) times daily with a meal.    Dispense:  60 tablet    Refill:  5    Fill one day early if pharmacy is closed on scheduled refill date. May substitute for generic if available.  Marland Kitchen HYDROcodone-acetaminophen (NORCO/VICODIN) 5-325 MG tablet    Sig: Take 1 tablet by mouth every 8 (eight) hours as needed for severe pain. Must last 30 days.    Dispense:  20 tablet    Refill:  0    Chronic Pain: STOP Act (Not applicable) Fill 1 day early if closed on refill date. Avoid benzodiazepines within 8 hours of opioids   Orders:  No orders of the defined types were placed in this encounter.  Follow-up plan:    Return if symptoms worsen or fail to improve.      Interventional management options:  Considering:   NOTE:COUMADINANTICOAGULATION(Stop:5days  Re-start: 2hrs) NO RFAuntil BMIis <35. Diagnostic L5-S1 LESI Diagnostic bilateral L3TFESI Diagnostic bilateral L4TFESI Diagnostic bilateral L5 TFESI Diagnostic left SI joint block #1  Possible righ SI joint RFA Possible bilateral lumbar facet RFA(NO RFAuntil BMIis <35.) Diagnostic right IA hip joint injection Diagnostic right femoral +ObturatorNB Possible right femoral + obturator nerve RFA   Palliative PRN treatment(s):   Palliative/Therapeutic left L4-5 LESI #2 Palliative left lumbar facet block#6 Palliativeright lumbar facet block(s) #10 Palliative right SI joint block #4(not as effective as facet blocks) Diagnostic left SI joint block #2 (not as effective in controlling the low back pain as the facet injections) Palliative/Therapeutic (Midline) caudalESI#4      Recent Visits Date Type Provider Dept  01/29/20 Procedure visit Milinda Pointer, MD [name]  01/16/20 Office Visit Milinda Pointer, MD [name]  12/19/19 Telemedicine Milinda Pointer, MD [name]  12/06/19 Procedure visit Milinda Pointer, MD [name]  Showing recent visits within past 90 days and meeting all other requirements Today's Visits Date Type Provider Dept  02/25/20 Telemedicine Milinda Pointer, MD [name]  Showing today's visits and meeting all other requirements Future Appointments No visits were found meeting these conditions. Showing future appointments within next 90 days and meeting all other requirements  I discussed the assessment and treatment plan with the patient. The patient was provided an opportunity to ask  questions and all were answered. The patient agreed with the plan and demonstrated an understanding of the instructions.  Patient  advised to call back or seek an in-person evaluation if the symptoms or condition worsens.  Duration of encounter: 12 minutes.  Note by: Gaspar Cola, MD Date: 02/25/2020; Time: 1:39 PM

## 2020-02-25 ENCOUNTER — Ambulatory Visit: Payer: PPO | Attending: Pain Medicine | Admitting: Pain Medicine

## 2020-02-25 ENCOUNTER — Other Ambulatory Visit: Payer: Self-pay

## 2020-02-25 DIAGNOSIS — Z6841 Body Mass Index (BMI) 40.0 and over, adult: Secondary | ICD-10-CM | POA: Diagnosis not present

## 2020-02-25 DIAGNOSIS — M4310 Spondylolisthesis, site unspecified: Secondary | ICD-10-CM

## 2020-02-25 DIAGNOSIS — M47816 Spondylosis without myelopathy or radiculopathy, lumbar region: Secondary | ICD-10-CM | POA: Diagnosis not present

## 2020-02-25 DIAGNOSIS — G894 Chronic pain syndrome: Secondary | ICD-10-CM | POA: Diagnosis not present

## 2020-02-25 DIAGNOSIS — M4300 Spondylolysis, site unspecified: Secondary | ICD-10-CM | POA: Diagnosis not present

## 2020-02-25 MED ORDER — CALCIUM CARBONATE 600 MG PO TABS: 600.0000 mg | ORAL_TABLET | Freq: Two times a day (BID) | ORAL | 5 refills | Status: DC

## 2020-02-25 MED ORDER — HYDROCODONE-ACETAMINOPHEN 5-325 MG PO TABS: 1.0000 | ORAL_TABLET | Freq: Three times a day (TID) | ORAL | 0 refills | Status: DC | PRN

## 2020-03-04 DIAGNOSIS — Z23 Encounter for immunization: Secondary | ICD-10-CM | POA: Diagnosis not present

## 2020-03-06 ENCOUNTER — Telehealth: Payer: Self-pay

## 2020-03-06 ENCOUNTER — Encounter: Payer: Self-pay | Admitting: Pain Medicine

## 2020-03-06 NOTE — Telephone Encounter (Signed)
No answer. Left message to confirm appt and review pp. Instructed to call back.

## 2020-03-07 DIAGNOSIS — Z7901 Long term (current) use of anticoagulants: Secondary | ICD-10-CM | POA: Diagnosis not present

## 2020-03-08 NOTE — Progress Notes (Signed)
Patient: Kara [name]  Service Category: E/M  Provider: Gaspar Cola, MD  DOB: Jan 22, 1951  DOS: 03/10/2020  Location: Office  MRN: [id number]  Setting: Ambulatory outpatient  Referring Provider: Tracie Harrier, MD  Type: Established Patient  Specialty: Interventional Pain Management  PCP: Tracie Harrier, MD  Location: Remote location  Delivery: TeleHealth     Virtual Encounter - Pain Management PROVIDER NOTE: Information contained herein reflects review and annotations entered in association with encounter. Interpretation of such information and data should be left to medically-trained personnel. Information provided to patient can be located elsewhere in the medical record under "Patient Instructions". Document created using STT-dictation technology, any transcriptional errors that may result from process are unintentional.    Contact & Pharmacy Preferred: [phone] Home: [phone] (home) Mobile: [phone] (mobile) E-mail: [email or web address]  [place], [address] [place] [address] Phone: [phone] Fax: [phone]   Pre-screening  Ms. Cutsforth offered "in-person" vs "virtual" encounter. She indicated preferring virtual for this encounter.   Reason COVID-19*  Social distancing based on CDC and AMA recommendations.   I contacted [name] on 03/10/2020 via telephone.      I clearly identified myself as Gaspar Cola, MD. I verified that I was speaking with the correct person using two identifiers (Name: Kara [name], and date of birth: August 20, 1950).  Consent I sought verbal advanced consent from [name] for virtual visit interactions. I informed Ms. Padmore of possible security and privacy concerns, risks, and limitations associated with providing "not-in-person" medical evaluation and management services. I also informed Ms. Strom of the  availability of "in-person" appointments. Finally, I informed her that there would be a charge for the virtual visit and that she could be  personally, fully or partially, financially responsible for it. Ms. Lagrange expressed understanding and agreed to proceed.   Historic Elements   Kara [name] is a 69 y.o. year old, female patient evaluated today after our last contact on 02/19/2020. Ms. 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 She also  has a past surgical history that includes Knee surgery; Carpal tunnel release (Bilateral); Colon resection sigmoid (N/A, 11/02/2016); Colostomy (N/A, 11/02/2016); Sigmoidoscopy (N/A, 11/13/2016); PULMONARY VENOGRAPHY (N/A, 11/19/2016); Colonoscopy with propofol (N/A, 02/03/2017); Colon surgery (11/02/2016); IVC FILTER INSERTION (Right, 10/2016); Colostomy reversal (N/A, 02/24/2017); Appendectomy (N/A, 02/24/2017); Lysis of adhesion (N/A, 02/24/2017); laparoscopy (N/A, 02/24/2017); Ileo loop diversion (N/A, 02/24/2017); Sigmoidoscopy (N/A, 05/19/2017); Ileostomy closure (N/A, 06/01/2017); IVC FILTER REMOVAL (N/A, 08/09/2017); Sacroplasty (N/A, 11/08/2017); Cataract extraction w/PHACO (Left, 11/21/2018); and Cataract extraction w/PHACO (Right, 12/12/2018). Ms. Assefa has a current medication list which includes the following prescription(s): aspirin ec, calcium carbonate, calcium polycarbophil, candesartan, vitamin d3, cyclobenzaprine, dulaglutide, gabapentin, gabapentin, glipizide, [START ON 03/14/2020] hydrocodone-acetaminophen, meclizine, multi-vitamins, nystatin cream, ropinirole, tolterodine, vitamin b-12, warfarin, and warfarin. She  reports that she has never smoked. She has  never used smokeless tobacco. She reports that she does not drink alcohol and does not use drugs. Ms. Rookstool is allergic to adhesive [tape], other, latex, and morphine and related.   HPI  Today, she is being contacted for worsening of previously known (established) problem.  The patient refers a recurrence of her low back pain going down through the back of the leg, just past  the knee.  She refers that it is exactly the same pain that she normally has.  We have treated this in the past with bilateral lumbar facet blocks.  She normally prefers to have those done without sedation.  She also indicated that she took it upon herself to stop her Coumadin in preparation for this block.  She indicated that she should be off for the appropriate number of days by Thursday.  She has requested that we see if we can get her in by Thursday to have this procedure done.  The last time we had her palliative procedure done was on 01/29/2020.  Pharmacotherapy Assessment  Analgesic: Hydrocodone/APAP 5/325, 1 tab PO QD PRN (5 mg/day of hydrocodone) MME: 5 mg/day.   Monitoring: [place] PMP: PDMP reviewed during this encounter.       Pharmacotherapy: No side-effects or adverse reactions reported. Compliance: No problems identified. Effectiveness: Clinically acceptable. Plan: Refer to "POC".  UDS:  Summary  Date Value Ref Range Status  09/20/2019 Note  Final    Comment:    ==================================================================== [employer] Select 13 (MW) ==================================================================== Test                             Result       Flag       Units Drug Absent but Declared for Prescription Verification   Hydrocodone                    Not Detected UNEXPECTED ng/mg creat ==================================================================== Test                      Result    Flag   Units      Ref Range   Creatinine              126              mg/dL       >=20 ==================================================================== Declared Medications:  The flagging and interpretation on this report are based on the  following declared medications.  Unexpected results may arise from  inaccuracies in the declared medications.  **Note: The testing scope of this panel includes these medications:  Hydrocodone  **Note: The testing scope of this panel does not include the  following reported medications:  Acetaminophen  Aspirin  Calcium  Candesartan  Cholecalciferol  Cyanocobalamin  Cyclobenzaprine  Dulaglutide  Gabapentin  Glipizide  Loperamide  Meclizine  Multivitamin  Nystatin  Ropinirole  Warfarin ==================================================================== For clinical consultation, please call [phone]. ====================================================================     Laboratory Chemistry Profile   Renal Lab Results  Component Value Date   BUN 10 07/09/2019   CREATININE 0.63 07/09/2019   GFRAA >60 07/09/2019   GFRNONAA >60 07/09/2019     Hepatic Lab Results  Component Value Date   AST 38 07/09/2019   ALT 43 07/09/2019   ALBUMIN 3.6 07/09/2019   ALKPHOS 51 07/09/2019   LIPASE 23 07/09/2019     Electrolytes Lab Results  Component Value Date   NA 139 07/09/2019   K 3.7 07/09/2019   CL 110 07/09/2019   CALCIUM 8.5 (L) 07/09/2019   MG 2.0 11/02/2017   PHOS 3.3 11/07/2016     Bone Lab Results  Component Value Date   25OHVITD1 25 (L) 11/02/2017   25OHVITD2 <1.0 11/02/2017   25OHVITD3 25 11/02/2017     Inflammation (CRP: Acute Phase) (ESR: Chronic Phase) Lab Results  Component Value  Date   CRP 5.3 (H) 11/02/2017   ESRSEDRATE 12 11/02/2017       Note: Above Lab results reviewed.  Imaging  DG PAIN CLINIC C-ARM 1-60 MIN NO REPORT Fluoro was used, but no Radiologist interpretation will be provided.  Please refer to "NOTES" tab for provider progress note.  Assessment  The  primary encounter diagnosis was Chronic pain syndrome. 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  Plan of Care  Problem-specific:  No problem-specific Assessment & Plan notes found for this encounter.  Ms. BRODI NERY has a current medication list which includes the following long-term medication(s): calcium carbonate, candesartan, glipizide, [START ON 03/14/2020] hydrocodone-acetaminophen, ropinirole, warfarin, and warfarin.  Pharmacotherapy (Medications Ordered): No orders of the defined types were placed in this encounter.  Orders:  Orders Placed This Encounter  Procedures  . LUMBAR FACET(MEDIAL BRANCH NERVE BLOCK) MBNB    Standing Status:   Future    Standing Expiration Date:   04/10/2020    Scheduling Instructions:     Procedure: Lumbar facet block (AKA.: Lumbosacral medial branch nerve block)     Side: Bilateral     Level: L3-4, L4-5, & L5-S1 Facets (L2, L3, L4, L5, & S1 Medial Branch Nerves)     Sedation: No Sedation.     Timeframe: ASAA    Order Specific Question:   Where will this procedure be performed?    Answer:   [name]  . Blood Thinner Instructions to Nursing    Always make sure patient has clearance from prescribing physician to stop blood thinners for interventional therapies. If the patient requires a Lovenox-bridge therapy, make sure arrangements are made to institute it with the assistance of the PCP.    Scheduling Instructions:     Have Ms. Antonelli stop the Coumadin (Warfarin) X 5 days prior to procedure or surgery.   Follow-up plan:   Return for Procedure (no  sedation): (B) L-FCT Blk , (Blood Thinner Protocol).      Interventional management options:  Considering:   NOTE:COUMADINANTICOAGULATION(Stop:5days  Re-start: 2hrs) NO RFAuntil BMIis <35. Diagnostic L5-S1 LESI Diagnostic bilateral L3TFESI Diagnostic bilateral L4TFESI Diagnostic bilateral L5 TFESI Diagnostic left SI joint block #1  Possible righ SI joint RFA Possible bilateral lumbar facet RFA(NO RFAuntil BMIis <35.) Diagnostic right IA hip joint injection Diagnostic right femoral +ObturatorNB Possible right femoral + obturator nerve RFA   Palliative PRN treatment(s):   Palliative/Therapeutic left L4-5 LESI #2 Palliative left lumbar facet block#6 Palliativeright lumbar facet block(s) #10 Palliative right SI joint block #4(not as effective as facet blocks) Diagnostic left SI joint block #2 (not as effective in controlling the low back pain as the facet injections) Palliative/Therapeutic (Midline) caudalESI#4       Recent Visits Date Type Provider Dept  02/25/20 Telemedicine Milinda Pointer, MD [name]  01/29/20 Procedure visit Milinda Pointer, MD [name]  01/16/20 Office Visit Milinda Pointer, MD [name]  12/19/19 Telemedicine Milinda Pointer, MD [name]  Showing recent visits within past 90 days and meeting all other requirements Today's Visits Date Type Provider Dept  03/10/20 Telemedicine Milinda Pointer, MD [name]  Showing today's visits and meeting all other requirements Future  Appointments No visits were found meeting these conditions. Showing future appointments within next 90 days and meeting all other requirements  I discussed the assessment and treatment plan with the patient. The patient was provided an opportunity to ask questions and all were answered. The patient agreed with the plan and demonstrated an understanding of the  instructions.  Patient advised to call back or seek an in-person evaluation if the symptoms or condition worsens.  Duration of encounter: 12 minutes.  Note by: Gaspar Cola, MD Date: 03/10/2020; Time: 8:23 AM

## 2020-03-10 ENCOUNTER — Telehealth: Payer: Self-pay | Admitting: *Deleted

## 2020-03-10 ENCOUNTER — Other Ambulatory Visit: Payer: Self-pay

## 2020-03-10 ENCOUNTER — Ambulatory Visit: Payer: PPO | Attending: Pain Medicine | Admitting: Pain Medicine

## 2020-03-10 DIAGNOSIS — G894 Chronic pain syndrome: Secondary | ICD-10-CM

## 2020-03-10 DIAGNOSIS — M545 Low back pain, unspecified: Secondary | ICD-10-CM | POA: Diagnosis not present

## 2020-03-10 DIAGNOSIS — Z6841 Body Mass Index (BMI) 40.0 and over, adult: Secondary | ICD-10-CM | POA: Diagnosis not present

## 2020-03-10 DIAGNOSIS — G8929 Other chronic pain: Secondary | ICD-10-CM

## 2020-03-10 DIAGNOSIS — M47816 Spondylosis without myelopathy or radiculopathy, lumbar region: Secondary | ICD-10-CM

## 2020-03-10 DIAGNOSIS — M47817 Spondylosis without myelopathy or radiculopathy, lumbosacral region: Secondary | ICD-10-CM | POA: Diagnosis not present

## 2020-03-10 DIAGNOSIS — Z7901 Long term (current) use of anticoagulants: Secondary | ICD-10-CM | POA: Diagnosis not present

## 2020-03-10 DIAGNOSIS — M4300 Spondylolysis, site unspecified: Secondary | ICD-10-CM | POA: Diagnosis not present

## 2020-03-10 DIAGNOSIS — M4310 Spondylolisthesis, site unspecified: Secondary | ICD-10-CM

## 2020-03-10 NOTE — Patient Instructions (Signed)
____________________________________________________________________________________________  Blood Thinners  IMPORTANT NOTICE:  If you take any of these, make sure to notify the nursing staff.  Failure to do so may result in injury.  Recommended time intervals to stop and restart blood-thinners, before & after invasive procedures  Generic Name Brand Name Stop Time. Must be stopped at least this long before procedures. After procedures, wait at least this long before re-starting.  Abciximab Reopro 15 days 2 hrs  Alteplase Activase 10 days 10 days  Anagrelide Agrylin    Apixaban Eliquis 3 days 6 hrs  Cilostazol Pletal 3 days 5 hrs  Clopidogrel Plavix 7-10 days 2 hrs  Dabigatran Pradaxa 5 days 6 hrs  Dalteparin Fragmin 24 hours 4 hrs  Dipyridamole Aggrenox 11days 2 hrs  Edoxaban Lixiana; Savaysa 3 days 2 hrs  Enoxaparin  Lovenox 24 hours 4 hrs  Eptifibatide Integrillin 8 hours 2 hrs  Fondaparinux  Arixtra 72 hours 12 hrs  Prasugrel Effient 7-10 days 6 hrs  Reteplase Retavase 10 days 10 days  Rivaroxaban Xarelto 3 days 6 hrs  Ticagrelor Brilinta 5-7 days 6 hrs  Ticlopidine Ticlid 10-14 days 2 hrs  Tinzaparin Innohep 24 hours 4 hrs  Tirofiban Aggrastat 8 hours 2 hrs  Warfarin Coumadin 5 days 2 hrs   Other medications with blood-thinning effects  Product indications Generic (Brand) names Note  Cholesterol Lipitor Stop 4 days before procedure  Blood thinner (injectable) Heparin (LMW or LMWH Heparin) Stop 24 hours before procedure  Cancer Ibrutinib (Imbruvica) Stop 7 days before procedure  Malaria/Rheumatoid Hydroxychloroquine (Plaquenil) Stop 11 days before procedure  Thrombolytics  10 days before or after procedures   Over-the-counter (OTC) Products with blood-thinning effects  Product Common names Stop Time  Aspirin > 325 mg Goody Powders, Excedrin, etc. 11 days  Aspirin ? 81 mg  7 days  Fish oil  4 days  Garlic supplements  7 days  Ginkgo biloba  36 hours  Ginseng  24  hours  NSAIDs Ibuprofen, Naprosyn, etc. 3 days  Vitamin E  4 days   ____________________________________________________________________________________________  ____________________________________________________________________________________________  General Risks and Possible Complications  Patient Responsibilities: It is important that you read this as it is part of your informed consent. It is our duty to inform you of the risks and possible complications associated with treatments offered to you. It is your responsibility as a patient to read this and to ask questions about anything that is not clear or that you believe was not covered in this document.  Patient's Rights: You have the right to refuse treatment. You also have the right to change your mind, even after initially having agreed to have the treatment done. However, under this last option, if you wait until the last second to change your mind, you may be charged for the materials used up to that point.  Introduction: Medicine is not an Chief Strategy Officer. Everything in Medicine, including the lack of treatment(s), carries the potential for danger, harm, or loss (which is by definition: Risk). In Medicine, a complication is a secondary problem, condition, or disease that can aggravate an already existing one. All treatments carry the risk of possible complications. The fact that a side effects or complications occurs, does not imply that the treatment was conducted incorrectly. It must be clearly understood that these can happen even when everything is done following the highest safety standards.  No treatment: You can choose not to proceed with the proposed treatment alternative. The "PRO(s)" would include: avoiding the risk of complications associated  with the therapy. The "CON(s)" would include: not getting any of the treatment benefits. These benefits fall under one of three categories: diagnostic; therapeutic; and/or palliative.  Diagnostic benefits include: getting information which can ultimately lead to improvement of the disease or symptom(s). Therapeutic benefits are those associated with the successful treatment of the disease. Finally, palliative benefits are those related to the decrease of the primary symptoms, without necessarily curing the condition (example: decreasing the pain from a flare-up of a chronic condition, such as incurable terminal cancer).  General Risks and Complications: These are associated to most interventional treatments. They can occur alone, or in combination. They fall under one of the following six (6) categories: no benefit or worsening of symptoms; bleeding; infection; nerve damage; allergic reactions; and/or death. 1. No benefits or worsening of symptoms: In Medicine there are no guarantees, only probabilities. No healthcare provider can ever guarantee that a medical treatment will work, they can only state the probability that it may. Furthermore, there is always the possibility that the condition may worsen, either directly, or indirectly, as a consequence of the treatment. 2. Bleeding: This is more common if the patient is taking a blood thinner, either prescription or over the counter (example: Goody Powders, Fish oil, Aspirin, Garlic, etc.), or if suffering a condition associated with impaired coagulation (example: Hemophilia, cirrhosis of the liver, low platelet counts, etc.). However, even if you do not have one on these, it can still happen. If you have any of these conditions, or take one of these drugs, make sure to notify your treating physician. 3. Infection: This is more common in patients with a compromised immune system, either due to disease (example: diabetes, cancer, human immunodeficiency virus [HIV], etc.), or due to medications or treatments (example: therapies used to treat cancer and rheumatological diseases). However, even if you do not have one on these, it can still happen.  If you have any of these conditions, or take one of these drugs, make sure to notify your treating physician. 4. Nerve Damage: This is more common when the treatment is an invasive one, but it can also happen with the use of medications, such as those used in the treatment of cancer. The damage can occur to small secondary nerves, or to large primary ones, such as those in the spinal cord and brain. This damage may be temporary or permanent and it may lead to impairments that can range from temporary numbness to permanent paralysis and/or brain death. 5. Allergic Reactions: Any time a substance or material comes in contact with our body, there is the possibility of an allergic reaction. These can range from a mild skin rash (contact dermatitis) to a severe systemic reaction (anaphylactic reaction), which can result in death. 6. Death: In general, any medical intervention can result in death, most of the time due to an unforeseen complication. ____________________________________________________________________________________________  ____________________________________________________________________________________________  Preparing for your procedure (without sedation)  Procedure appointments are limited to planned procedures: . No Prescription Refills. . No disability issues will be discussed. . No medication changes will be discussed.  Instructions: . Oral Intake: Do not eat or drink anything for at least 6 hours prior to your procedure. (Exception: Blood Pressure Medication. See below.) . Transportation: Unless otherwise stated by your physician, you may drive yourself after the procedure. . Blood Pressure Medicine: Do not forget to take your blood pressure medicine with a sip of water the morning of the procedure. If your Diastolic (lower reading)is above 100 mmHg, elective cases  will be cancelled/rescheduled. . Blood thinners: These will need to be stopped for procedures. Notify our  staff if you are taking any blood thinners. Depending on which one you take, there will be specific instructions on how and when to stop it. . Diabetics on insulin: Notify the staff so that you can be scheduled 1st case in the morning. If your diabetes requires high dose insulin, take only  of your normal insulin dose the morning of the procedure and notify the staff that you have done so. . Preventing infections: Shower with an antibacterial soap the morning of your procedure.  . Build-up your immune system: Take 1000 mg of Vitamin C with every meal (3 times a day) the day prior to your procedure. Marland Kitchen Antibiotics: Inform the staff if you have a condition or reason that requires you to take antibiotics before dental procedures. . Pregnancy: If you are pregnant, call and cancel the procedure. . Sickness: If you have a cold, fever, or any active infections, call and cancel the procedure. . Arrival: You must be in the facility at least 30 minutes prior to your scheduled procedure. . Children: Do not bring any children with you. . Dress appropriately: Bring dark clothing that you would not mind if they get stained. . Valuables: Do not bring any jewelry or valuables.  Reasons to call and reschedule or cancel your procedure: (Following these recommendations will minimize the risk of a serious complication.) . Surgeries: Avoid having procedures within 2 weeks of any surgery. (Avoid for 2 weeks before or after any surgery). . Flu Shots: Avoid having procedures within 2 weeks of a flu shots or . (Avoid for 2 weeks before or after immunizations). . Barium: Avoid having a procedure within 7-10 days after having had a radiological study involving the use of radiological contrast. (Myelograms, Barium swallow or enema study). . Heart attacks: Avoid any elective procedures or surgeries for the initial 6 months after a "Myocardial Infarction" (Heart Attack). . Blood thinners: It is imperative that you stop these  medications before procedures. Let us know if you if you take any blood thinner.  . Infection: Avoid procedures during or within two weeks of an infection (including chest colds or gastrointestinal problems). Symptoms associated with infections include: Localized redness, fever, chills, night sweats or profuse sweating, burning sensation when voiding, cough, congestion, stuffiness, runny nose, sore throat, diarrhea, nausea, vomiting, cold or Flu symptoms, recent or current infections. It is specially important if the infection is over the area that we intend to treat. Marland Kitchen Heart and lung problems: Symptoms that may suggest an active cardiopulmonary problem include: cough, chest pain, breathing difficulties or shortness of breath, dizziness, ankle swelling, uncontrolled high or unusually low blood pressure, and/or palpitations. If you are experiencing any of these symptoms, cancel your procedure and contact your primary care physician for an evaluation.  Remember:  Regular Business hours are:  Monday to Thursday 8:00 AM to 4:00 PM  Provider's Schedule: Milinda Pointer, MD:  Procedure days: Tuesday and Thursday 7:30 AM to 4:00 PM  Gillis Santa, MD:  Procedure days: Monday and Wednesday 7:30 AM to 4:00 PM ____________________________________________________________________________________________

## 2020-03-13 ENCOUNTER — Ambulatory Visit
Admission: RE | Admit: 2020-03-13 | Discharge: 2020-03-13 | Disposition: A | Discharge status: Home / Self Care | Payer: PPO | Source: Ambulatory Visit | Attending: Pain Medicine | Admitting: Pain Medicine

## 2020-03-13 ENCOUNTER — Other Ambulatory Visit: Payer: Self-pay

## 2020-03-13 ENCOUNTER — Encounter: Payer: Self-pay | Admitting: Pain Medicine

## 2020-03-13 ENCOUNTER — Ambulatory Visit (HOSPITAL_BASED_OUTPATIENT_CLINIC_OR_DEPARTMENT_OTHER): Payer: PPO | Admitting: Pain Medicine

## 2020-03-13 VITALS — BP 112/67 | HR 84 | Temp 97.4°F | Resp 20 | Ht 63.0 in | Wt 217.0 lb

## 2020-03-13 DIAGNOSIS — G8929 Other chronic pain: Secondary | ICD-10-CM | POA: Insufficient documentation

## 2020-03-13 DIAGNOSIS — M47816 Spondylosis without myelopathy or radiculopathy, lumbar region: Secondary | ICD-10-CM

## 2020-03-13 DIAGNOSIS — M4310 Spondylolisthesis, site unspecified: Secondary | ICD-10-CM | POA: Diagnosis not present

## 2020-03-13 DIAGNOSIS — M47817 Spondylosis without myelopathy or radiculopathy, lumbosacral region: Secondary | ICD-10-CM | POA: Insufficient documentation

## 2020-03-13 DIAGNOSIS — M545 Low back pain, unspecified: Secondary | ICD-10-CM

## 2020-03-13 DIAGNOSIS — G8918 Other acute postprocedural pain: Secondary | ICD-10-CM

## 2020-03-13 DIAGNOSIS — M4300 Spondylolysis, site unspecified: Secondary | ICD-10-CM | POA: Diagnosis not present

## 2020-03-13 DIAGNOSIS — M5137 Other intervertebral disc degeneration, lumbosacral region: Secondary | ICD-10-CM | POA: Diagnosis not present

## 2020-03-13 DIAGNOSIS — Z7901 Long term (current) use of anticoagulants: Secondary | ICD-10-CM | POA: Insufficient documentation

## 2020-03-13 DIAGNOSIS — Z91040 Latex allergy status: Secondary | ICD-10-CM | POA: Insufficient documentation

## 2020-03-13 DIAGNOSIS — M4306 Spondylolysis, lumbar region: Secondary | ICD-10-CM | POA: Insufficient documentation

## 2020-03-13 MED ORDER — KETOROLAC TROMETHAMINE 60 MG/2ML IM SOLN
60.0000 mg | Freq: Once | INTRAMUSCULAR | Status: AC
  Administered 2020-03-13: 60 mg via INTRAMUSCULAR

## 2020-03-13 MED ORDER — TRIAMCINOLONE ACETONIDE 40 MG/ML IJ SUSP
80.0000 mg | Freq: Once | INTRAMUSCULAR | Status: AC
  Administered 2020-03-13: 80 mg

## 2020-03-13 MED ORDER — TRIAMCINOLONE ACETONIDE 40 MG/ML IJ SUSP
INTRAMUSCULAR | Status: AC
  Filled 2020-03-13: qty 2

## 2020-03-13 MED ORDER — ROPIVACAINE HCL 2 MG/ML IJ SOLN
INTRAMUSCULAR | Status: AC
  Filled 2020-03-13: qty 20

## 2020-03-13 MED ORDER — ORPHENADRINE CITRATE 30 MG/ML IJ SOLN
INTRAMUSCULAR | Status: AC
  Filled 2020-03-13: qty 2

## 2020-03-13 MED ORDER — FENTANYL CITRATE (PF) 100 MCG/2ML IJ SOLN
25.0000 ug | INTRAMUSCULAR | Status: DC | PRN
  Administered 2020-03-13: 50 ug via INTRAVENOUS

## 2020-03-13 MED ORDER — MIDAZOLAM HCL 5 MG/5ML IJ SOLN
INTRAMUSCULAR | Status: AC
  Filled 2020-03-13: qty 5

## 2020-03-13 MED ORDER — ORPHENADRINE CITRATE 30 MG/ML IJ SOLN
60.0000 mg | Freq: Once | INTRAMUSCULAR | Status: AC
  Administered 2020-03-13: 60 mg via INTRAMUSCULAR

## 2020-03-13 MED ORDER — FENTANYL CITRATE (PF) 100 MCG/2ML IJ SOLN
INTRAMUSCULAR | Status: AC
  Filled 2020-03-13: qty 2

## 2020-03-13 MED ORDER — MIDAZOLAM HCL 5 MG/5ML IJ SOLN
1.0000 mg | INTRAMUSCULAR | Status: DC | PRN
  Administered 2020-03-13: 2 mg via INTRAVENOUS

## 2020-03-13 MED ORDER — LIDOCAINE HCL 2 % IJ SOLN
20.0000 mL | Freq: Once | INTRAMUSCULAR | Status: AC
  Administered 2020-03-13: 400 mg

## 2020-03-13 MED ORDER — KETOROLAC TROMETHAMINE 60 MG/2ML IM SOLN
INTRAMUSCULAR | Status: AC
  Filled 2020-03-13: qty 2

## 2020-03-13 MED ORDER — LACTATED RINGERS IV SOLN
1000.0000 mL | Freq: Once | INTRAVENOUS | Status: AC
  Administered 2020-03-13: 1000 mL via INTRAVENOUS

## 2020-03-13 MED ORDER — LIDOCAINE HCL 2 % IJ SOLN
INTRAMUSCULAR | Status: AC
  Filled 2020-03-13: qty 20

## 2020-03-13 MED ORDER — ROPIVACAINE HCL 2 MG/ML IJ SOLN
18.0000 mL | Freq: Once | INTRAMUSCULAR | Status: AC
  Administered 2020-03-13: 18 mL via PERINEURAL

## 2020-03-13 NOTE — Patient Instructions (Signed)

## 2020-03-13 NOTE — Progress Notes (Signed)
PROVIDER NOTE: Information contained herein reflects review and annotations entered in association with encounter. Interpretation of such information and data should be left to medically-trained personnel. Information provided to patient can be located elsewhere in the medical record under "Patient Instructions". Document created using STT-dictation technology, any transcriptional errors that may result from process are unintentional.    Patient: Kara [name]  Service Category: Procedure  Provider: Gaspar Cola, MD  DOB: 1951/04/22  DOS: 03/13/2020  Location: [facility]  MRN: [id number]  Setting: Ambulatory - outpatient  Referring Provider: Tracie Harrier, MD  Type: Established Patient  Specialty: Interventional Pain Management  PCP: Tracie Harrier, MD   Primary Reason for Visit: Interventional Pain Management Treatment. CC: Back Pain (lumbar bilateral )  Procedure:          Anesthesia, Analgesia, Anxiolysis:  Type: Lumbar Facet, Medial Branch Block(s)          Primary Purpose: Palliative Region: Posterolateral Lumbosacral Spine Level: L2, L3, L4, L5, & S1 Medial Branch Level(s). Injecting these levels blocks the L3-4, L4-5, and L5-S1 lumbar facet joints. Laterality: Bilateral  Type: Moderate (Conscious) Sedation combined with Local Anesthesia Indication(s): Analgesia and Anxiety Route: Intravenous (IV) IV Access: Secured Sedation: Meaningful verbal contact was maintained at all times during the procedure  Local Anesthetic: Lidocaine 1-2%  Position: Prone   Indications: 1. Lumbar facet syndrome (Bilateral)   2. Spondylosis without myelopathy or radiculopathy, lumbosacral region   3. 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 Lumbar facet arthropathy (L3-4, L4-5, and L5-S1) (Bilateral)   5. 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 DDD (degenerative disc disease), lumbosacral   7. Chronic low back pain (1ry area of  Pain) (Bilateral) w/o sciatica   8. History of allergy to latex   9. Chronic anticoagulation (COUMADIN)    Pain Score: Pre-procedure: 10-Worst pain ever/10 Post-procedure: 9 /10   Pre-op Assessment:  Kara [name] is a 69 y.o. (year old), female patient, seen today for interventional treatment. She  has a past surgical history that includes Knee surgery; Carpal tunnel release (Bilateral); Colon resection sigmoid (N/A, 11/02/2016); Colostomy (N/A, 11/02/2016); Sigmoidoscopy (N/A, 11/13/2016); PULMONARY VENOGRAPHY (N/A, 11/19/2016); Colonoscopy with propofol (N/A, 02/03/2017); Colon surgery (11/02/2016); IVC FILTER INSERTION (Right, 10/2016); Colostomy reversal (N/A, 02/24/2017); Appendectomy (N/A, 02/24/2017); Lysis of adhesion (N/A, 02/24/2017); laparoscopy (N/A, 02/24/2017); Ileo loop diversion (N/A, 02/24/2017); Sigmoidoscopy (N/A, 05/19/2017); Ileostomy closure (N/A, 06/01/2017); IVC FILTER REMOVAL (N/A, 08/09/2017); Sacroplasty (N/A, 11/08/2017); Cataract extraction w/PHACO (Left, 11/21/2018); and Cataract extraction w/PHACO (Right, 12/12/2018). Kara [name] has a current medication list which includes the following prescription(s): aspirin ec, calcium carbonate, calcium polycarbophil, candesartan, vitamin d3, cyclobenzaprine, dulaglutide, gabapentin, gabapentin, glipizide, glipizide, [START ON 03/14/2020] hydrocodone-acetaminophen, meclizine, multi-vitamins, nystatin cream, ropinirole, tolterodine, vitamin b-12, warfarin, and warfarin, and the following Facility-Administered Medications: fentanyl and midazolam. Her primarily concern today is the Back Pain (lumbar bilateral )  Initial Vital Signs:  Pulse/HCG Rate: 84ECG Heart Rate: 89 Temp: (!) 97.4 F (36.3 C) Resp: 16 BP: 138/80 SpO2: 97 %  BMI: Estimated body mass index is 38.44 kg/m as calculated from the following:   Height as of this encounter: 5\' 3"  (1.6 m).   Weight as of this encounter: 217 lb (98.4 kg).  Risk  Assessment: Allergies: Reviewed. She is allergic to adhesive [tape], other, latex, and morphine and related.  Allergy Precautions: None required Coagulopathies: Reviewed. None identified.  Blood-thinner therapy: None at this time Active Infection(s): Reviewed. None identified. Kara [name] is  afebrile  Site Confirmation: Kara [name] was asked to confirm the procedure and laterality before marking the site Procedure checklist: Completed Consent: Before the procedure and under the influence of no sedative(s), amnesic(s), or anxiolytics, the patient was informed of the treatment options, risks and possible complications. To fulfill our ethical and legal obligations, as recommended by the American Medical Association's Code of Ethics, I have informed the patient of my clinical impression; the nature and purpose of the treatment or procedure; the risks, benefits, and possible complications of the intervention; the alternatives, including doing nothing; the risk(s) and benefit(s) of the alternative treatment(s) or procedure(s); and the risk(s) and benefit(s) of doing nothing. The patient was provided information about the general risks and possible complications associated with the procedure. These may include, but are not limited to: failure to achieve desired goals, infection, bleeding, organ or nerve damage, allergic reactions, paralysis, and death. In addition, the patient was informed of those risks and complications associated to Spine-related procedures, such as failure to decrease pain; infection (i.e.: Meningitis, epidural or intraspinal abscess); bleeding (i.e.: epidural hematoma, subarachnoid hemorrhage, or any other type of intraspinal or peri-dural bleeding); organ or nerve damage (i.e.: Any type of peripheral nerve, nerve root, or spinal cord injury) with subsequent damage to sensory, motor, and/or autonomic systems, resulting in permanent pain, numbness, and/or weakness of one or  several areas of the body; allergic reactions; (i.e.: anaphylactic reaction); and/or death. Furthermore, the patient was informed of those risks and complications associated with the medications. These include, but are not limited to: allergic reactions (i.e.: anaphylactic or anaphylactoid reaction(s)); adrenal axis suppression; blood sugar elevation that in diabetics may result in ketoacidosis or comma; water retention that in patients with history of congestive heart failure may result in shortness of breath, pulmonary edema, and decompensation with resultant heart failure; weight gain; swelling or edema; medication-induced neural toxicity; particulate matter embolism and blood vessel occlusion with resultant organ, and/or nervous system infarction; and/or aseptic necrosis of one or more joints. Finally, the patient was informed that Medicine is not an exact science; therefore, there is also the possibility of unforeseen or unpredictable risks and/or possible complications that may result in a catastrophic outcome. The patient indicated having understood very clearly. We have given the patient no guarantees and we have made no promises. Enough time was given to the patient to ask questions, all of which were answered to the patient's satisfaction. Ms. Axelson has indicated that she wanted to continue with the procedure. Attestation: I, the ordering provider, attest that I have discussed with the patient the benefits, risks, side-effects, alternatives, likelihood of achieving goals, and potential problems during recovery for the procedure that I have provided informed consent. Date   Time: 03/13/2020 10:34 AM  Pre-Procedure Preparation:  Monitoring: As per clinic protocol. Respiration, ETCO2, SpO2, BP, heart rate and rhythm monitor placed and checked for adequate function Safety Precautions: Patient was assessed for positional comfort and pressure points before starting the procedure. Time-out: I  initiated and conducted the "Time-out" before starting the procedure, as per protocol. The patient was asked to participate by confirming the accuracy of the "Time Out" information. Verification of the correct person, site, and procedure were performed and confirmed by me, the nursing staff, and the patient. "Time-out" conducted as per Joint Commission's Universal Protocol (UP.01.01.01). Time: 1117  Description of Procedure:          Laterality: Bilateral. The procedure was performed in identical fashion on both sides. Levels:  L2, L3, L4,  L5, & S1 Medial Branch Level(s) Area Prepped: Posterior Lumbosacral Region DuraPrep (Iodine Povacrylex [0.7% available iodine] and Isopropyl Alcohol, 74% w/w) Safety Precautions: Aspiration looking for blood return was conducted prior to all injections. At no point did we inject any substances, as a needle was being advanced. Before injecting, the patient was told to immediately notify me if she was experiencing any new onset of "ringing in the ears, or metallic taste in the mouth". No attempts were made at seeking any paresthesias. Safe injection practices and needle disposal techniques used. Medications properly checked for expiration dates. SDV (single dose vial) medications used. After the completion of the procedure, all disposable equipment used was discarded in the proper designated medical waste containers. Local Anesthesia: Protocol guidelines were followed. The patient was positioned over the fluoroscopy table. The area was prepped in the usual manner. The time-out was completed. The target area was identified using fluoroscopy. A 12-in long, straight, sterile hemostat was used with fluoroscopic guidance to locate the targets for each level blocked. Once located, the skin was marked with an approved surgical skin marker. Once all sites were marked, the skin (epidermis, dermis, and hypodermis), as well as deeper tissues (fat, connective tissue and muscle) were  infiltrated with a small amount of a short-acting local anesthetic, loaded on a 10cc syringe with a 25G, 1.5-in  Needle. An appropriate amount of time was allowed for local anesthetics to take effect before proceeding to the next step. Local Anesthetic: Lidocaine 2.0% The unused portion of the local anesthetic was discarded in the proper designated containers. Technical explanation of process:  L2 Medial Branch Nerve Block (MBB): The target area for the L2 medial branch is at the junction of the postero-lateral aspect of the superior articular process and the superior, posterior, and medial edge of the transverse process of L3. Under fluoroscopic guidance, a Quincke needle was inserted until contact was made with os over the superior postero-lateral aspect of the pedicular shadow (target area). After negative aspiration for blood, 0.5 mL of the nerve block solution was injected without difficulty or complication. The needle was removed intact. L3 Medial Branch Nerve Block (MBB): The target area for the L3 medial branch is at the junction of the postero-lateral aspect of the superior articular process and the superior, posterior, and medial edge of the transverse process of L4. Under fluoroscopic guidance, a Quincke needle was inserted until contact was made with os over the superior postero-lateral aspect of the pedicular shadow (target area). After negative aspiration for blood, 0.5 mL of the nerve block solution was injected without difficulty or complication. The needle was removed intact. L4 Medial Branch Nerve Block (MBB): The target area for the L4 medial branch is at the junction of the postero-lateral aspect of the superior articular process and the superior, posterior, and medial edge of the transverse process of L5. Under fluoroscopic guidance, a Quincke needle was inserted until contact was made with os over the superior postero-lateral aspect of the pedicular shadow (target area). After negative  aspiration for blood, 0.5 mL of the nerve block solution was injected without difficulty or complication. The needle was removed intact. L5 Medial Branch Nerve Block (MBB): The target area for the L5 medial branch is at the junction of the postero-lateral aspect of the superior articular process and the superior, posterior, and medial edge of the sacral ala. Under fluoroscopic guidance, a Quincke needle was inserted until contact was made with os over the superior postero-lateral aspect of the pedicular shadow (  target area). After negative aspiration for blood, 0.5 mL of the nerve block solution was injected without difficulty or complication. The needle was removed intact. S1 Medial Branch Nerve Block (MBB): The target area for the S1 medial branch is at the posterior and inferior 6 o'clock position of the L5-S1 facet joint. Under fluoroscopic guidance, the Quincke needle inserted for the L5 MBB was redirected until contact was made with os over the inferior and postero aspect of the sacrum, at the 6 o' clock position under the L5-S1 facet joint (Target area). After negative aspiration for blood, 0.5 mL of the nerve block solution was injected without difficulty or complication. The needle was removed intact.  Nerve block solution: 0.2% PF-Ropivacaine + Triamcinolone (40 mg/mL) diluted to a final concentration of 4 mg of Triamcinolone/mL of Ropivacaine The unused portion of the solution was discarded in the proper designated containers. Procedural Needles: 22-gauge, 3.5-inch, Quincke needles used for all levels.  Once the entire procedure was completed, the treated area was cleaned, making sure to leave some of the prepping solution back to take advantage of its long term bactericidal properties.   Illustration of the posterior view of the lumbar spine and the posterior neural structures. Laminae of L2 through S1 are labeled. DPRL5, dorsal primary ramus of L5; DPRS1, dorsal primary ramus of S1; DPR3,  dorsal primary ramus of L3; FJ, facet (zygapophyseal) joint L3-L4; I, inferior articular process of L4; LB1, lateral branch of dorsal primary ramus of L1; IAB, inferior articular branches from L3 medial branch (supplies L4-L5 facet joint); IBP, intermediate branch plexus; MB3, medial branch of dorsal primary ramus of L3; NR3, third lumbar nerve root; S, superior articular process of L5; SAB, superior articular branches from L4 (supplies L4-5 facet joint also); TP3, transverse process of L3.  Vitals:   03/13/20 1126 03/13/20 1130 03/13/20 1140 03/13/20 1149  BP: 130/61 (!) 140/91 (!) 147/68 112/67  Pulse:      Resp: 18 16 20 20   Temp:      TempSrc:      SpO2: 96% 96% 94% 99%  Weight:      Height:         Start Time: 1117 hrs. End Time: 1129 hrs.  Imaging Guidance (Spinal):          Type of Imaging Technique: Fluoroscopy Guidance (Spinal) Indication(s): Assistance in needle guidance and placement for procedures requiring needle placement in or near specific anatomical locations not easily accessible without such assistance. Exposure Time: Please see nurses notes. Contrast: None used. Fluoroscopic Guidance: I was personally present during the use of fluoroscopy. "Tunnel Vision Technique" used to obtain the best possible view of the target area. Parallax error corrected before commencing the procedure. "Direction-depth-direction" technique used to introduce the needle under continuous pulsed fluoroscopy. Once target was reached, antero-posterior, oblique, and lateral fluoroscopic projection used confirm needle placement in all planes. Images permanently stored in EMR. Interpretation: No contrast injected. I personally interpreted the imaging intraoperatively. Adequate needle placement confirmed in multiple planes. Permanent images saved into the patient's record.  Antibiotic Prophylaxis:   Anti-infectives (From admission, onward)   None     Indication(s): None identified  Post-operative  Assessment:  Post-procedure Vital Signs:  Pulse/HCG Rate: 8490 Temp: (!) 97.4 F (36.3 C) Resp: 20 BP: 112/67 SpO2: 99 %  EBL: None  Complications: No immediate post-treatment complications observed by team, or reported by patient.  Note: The patient tolerated the entire procedure well. A repeat set of vitals were taken after  the procedure and the patient was kept under observation following institutional policy, for this type of procedure. Post-procedural neurological assessment was performed, showing return to baseline, prior to discharge. The patient was provided with post-procedure discharge instructions, including a section on how to identify potential problems. Should any problems arise concerning this procedure, the patient was given instructions to immediately contact us, at any time, without hesitation. In any case, we plan to contact the patient by telephone for a follow-up status report regarding this interventional procedure.  Comments:  This patient has a longstanding history of "lower back muscle pain and spasms" after her procedures.  She is did not have any problems with this until one particular procedure where she did develop some spasms that occurred in the area around which we had put the needles in.  In that particular procedure she had no IV sedation and she was given a Toradol 60 mg IM with a Norflex 60 mg IM injection.  This completely eliminated the problem.  However, after that, she will tell us that she is having muscle pain and spasms knowing that this will be treated with the Toradol/Norflex IM injection.  Today the procedure was done with sedation and we did use Versed as an antianxiety medication and muscle relaxant.  Despite the fact that we did that, she again complained of the typical low back pain and spasms, which by now we are having some difficulty clinically observing those.  It is as if she has fallen into the habit of always asking for that after the procedure.   This pattern was again repeated today.  Plan of Care  Orders:  Orders Placed This Encounter  Procedures   LUMBAR FACET(MEDIAL BRANCH NERVE BLOCK) MBNB    Scheduling Instructions:     Procedure: Lumbar facet block (AKA.: Lumbosacral medial branch nerve block)     Side: Bilateral     Level: L3-4, L4-5, & L5-S1 Facets (L2, L3, L4, L5, & S1 Medial Branch Nerves)     Sedation: Patient's choice.     Timeframe: Today    Order Specific Question:   Where will this procedure be performed?    Answer:   [name]   DG PAIN [facility] C-ARM 1-60 MIN NO REPORT    Intraoperative interpretation by procedural physician at [place].    Standing Status:   Standing    Number of Occurrences:   1    Order Specific Question:   Reason for exam:    Answer:   Assistance in needle guidance and placement for procedures requiring needle placement in or near specific anatomical locations not easily accessible without such assistance.   Informed Consent Details: Physician/Practitioner Attestation; Transcribe to consent form and obtain patient signature    Nursing Order: Transcribe to consent form and obtain patient signature. Note: Always confirm laterality of pain with Ms. Mazur, before procedure.    Order Specific Question:   Physician/Practitioner attestation of informed consent for procedure/surgical case    Answer:   I, the physician/practitioner, attest that I have discussed with the patient the benefits, risks, side effects, alternatives, likelihood of achieving goals and potential problems during recovery for the procedure that I have provided informed consent.    Order Specific Question:   Procedure    Answer:   Lumbar Facet Block  under fluoroscopic guidance    Order Specific Question:   Physician/Practitioner performing the procedure    Answer:   Wissam Resor A. Dossie Arbour MD    Order Specific  Question:   Indication/Reason    Answer:   Low Back Pain, with our without leg  pain, due to Facet Joint Arthralgia (Joint Pain) Spondylosis (Arthritis of the Spine), without myelopathy or radiculopathy (Nerve Damage).   Care order/instruction: Please confirm that the patient has stopped the Coumadin (Warfarin) X 5 days prior to procedure or surgery.    Please confirm that the patient has stopped the Coumadin (Warfarin) X 5 days prior to procedure or surgery.    Standing Status:   Standing    Number of Occurrences:   1   Provide equipment / supplies at bedside    "Block Tray" (Disposable   single use) Needle type: SpinalSpinal Amount/quantity: 4 Size: Long (7-inch) Gauge: 22G    Standing Status:   Standing    Number of Occurrences:   1    Order Specific Question:   Specify    Answer:   Block Tray   Bleeding precautions    Standing Status:   Standing    Number of Occurrences:   1   Latex precautions    Activate Latex-Free Protocol.    Standing Status:   Standing    Number of Occurrences:   1   Chronic Opioid Analgesic:  Hydrocodone/APAP 5/325, 1 tab PO QD PRN (5 mg/day of hydrocodone) MME: 5 mg/day.   Medications ordered for procedure: Meds ordered this encounter  Medications   lidocaine (XYLOCAINE) 2 % (with pres) injection 400 mg   ropivacaine (PF) 2 mg/mL (0.2%) (NAROPIN) injection 18 mL   triamcinolone acetonide (KENALOG-40) injection 80 mg   lactated ringers infusion 1,000 mL   midazolam (VERSED) 5 MG/5ML injection 1-2 mg    Make sure Flumazenil is available in the pyxis when using this medication. If oversedation occurs, administer 0.2 mg IV over 15 sec. If after 45 sec no response, administer 0.2 mg again over 1 min; may repeat at 1 min intervals; not to exceed 4 doses (1 mg)   fentaNYL (SUBLIMAZE) injection 25-50 mcg    Make sure Narcan is available in the pyxis when using this medication. In the event of respiratory depression (RR< 8/min): Titrate NARCAN (naloxone) in increments of 0.1 to 0.2 mg IV at 2-3 minute intervals, until desired  degree of reversal.   ketorolac (TORADOL) injection 60 mg   orphenadrine (NORFLEX) injection 60 mg   Medications administered: We administered lidocaine, ropivacaine (PF) 2 mg/mL (0.2%), triamcinolone acetonide, lactated ringers, midazolam, fentaNYL, ketorolac, and orphenadrine.  See the medical record for exact dosing, route, and time of administration.  Follow-up plan:   Return in about 2 weeks (around 03/27/2020) for (VV), (PP) Follow-up.       Interventional management options:  Considering:   NOTE:COUMADINANTICOAGULATION(Stop:5days   Re-start: 2hrs) NO RFAuntil BMIis <35. Diagnostic L5-S1 LESI Diagnostic bilateral L3TFESI Diagnostic bilateral L4TFESI Diagnostic bilateral L5 TFESI Diagnostic left SI joint block #1  Possible righ SI joint RFA Possible bilateral lumbar facet RFA(NO RFAuntil BMIis <35.) Diagnostic right IA hip joint injection Diagnostic right femoral +ObturatorNB Possible right femoral + obturator nerve RFA   Palliative PRN treatment(s):   Palliative/Therapeutic left L4-5 LESI #2 Palliative left lumbar facet block#6 Palliativeright lumbar facet block(s) #10 Palliative right SI joint block #4(not as effective as facet blocks) Diagnostic left SI joint block #2 (not as effective in controlling the low back pain as the facet injections) Palliative/Therapeutic (Midline) caudalESI#4    Recent Visits Date Type Provider Dept  03/10/20 Telemedicine Milinda Pointer, [facility]  02/25/20 Telemedicine [place],  Beatriz Chancellor, [facility]  01/29/20 Procedure visit Milinda Pointer, MD [name]  01/16/20 Office Visit Milinda Pointer, MD [name]  12/19/19 Telemedicine Milinda Pointer, MD [name]  Showing recent visits within past 90 days and meeting all other requirements Today's Visits Date Type Provider Dept  03/13/20 Procedure visit Milinda Pointer, MD  [name]  Showing today's visits and meeting all other requirements Future Appointments Date Type Provider Dept  03/27/20 Appointment Milinda Pointer, MD [name]  Showing future appointments within next 90 days and meeting all other requirements  Disposition: Discharge home  Discharge (Date   Time): 03/13/2020; 1205 hrs.   Primary Care Physician: Tracie Harrier, MD Location: [facility] Note by: Gaspar Cola, MD Date: 03/13/2020; Time: 12:09 PM  Disclaimer:  Medicine is not an Chief Strategy Officer. The only guarantee in medicine is that nothing is guaranteed. It is important to note that the decision to proceed with this intervention was based on the information collected from the patient. The Data and conclusions were drawn from the patient's questionnaire, the interview, and the physical examination. Because the information was provided in large part by the patient, it cannot be guaranteed that it has not been purposely or unconsciously manipulated. Every effort has been made to obtain as much relevant data as possible for this evaluation. It is important to note that the conclusions that lead to this procedure are derived in large part from the available data. Always take into account that the treatment will also be dependent on availability of resources and existing treatment guidelines, considered by other Pain Management Practitioners as being common knowledge and practice, at the time of the intervention. For Medico-Legal purposes, it is also important to point out that variation in procedural techniques and pharmacological choices are the acceptable norm. The indications, contraindications, technique, and results of the above procedure should only be interpreted and judged by a Board-Certified Interventional Pain Specialist with extensive familiarity and expertise in the same exact procedure and technique.

## 2020-03-13 NOTE — Progress Notes (Signed)
Safety precautions to be maintained throughout the outpatient stay will include: orient to surroundings, keep bed in low position, maintain call bell within reach at all times, provide assistance with transfer out of bed and ambulation.  

## 2020-03-14 ENCOUNTER — Telehealth: Payer: Self-pay

## 2020-03-14 NOTE — Telephone Encounter (Signed)
Post procedure phone call.  Patient states she is doing ok.j

## 2020-03-25 NOTE — Progress Notes (Signed)
Unsuccessful attempt to contact patient for Virtual Visit (Pain Management Telehealth)   Patient provided contact information:  [phone] (home); [phone] (mobile); (Preferred) [phone] [email or web address]   Pre-screening:  Our staff was successful in contacting Kara [name] using the above provided information.   I unsuccessfully attempted to make contact with Kara [name] on 3 different locations on 03/27/2020 via telephone. I was unable to complete the virtual encounter due to call going directly to voicemail. I was able to leave a message where I clearly identify myself as Gaspar Cola, MD and I left a message to call us back to reschedule the call.  The information below was obtained by the nursing staff during the preencounter assessment.  Post-Procedure Evaluation  Procedure (03/13/2020): Palliative bilateral lumbar facet block under fluoroscopic guidance and IV sedation Pre-procedure pain level: 10/10 Post-procedure: 9/10 (< 50% relief)  Sedation: Sedation provided.  Effectiveness during initial hour after procedure(Ultra-Short Term Relief): 100 %.  Local anesthetic used: Long-acting (4-6 hours) Effectiveness: Defined as any analgesic benefit obtained secondary to the administration of local anesthetics. This carries significant diagnostic value as to the etiological location, or anatomical origin, of the pain. Duration of benefit is expected to coincide with the duration of the local anesthetic used.  Effectiveness during initial 4-6 hours after procedure(Short-Term Relief): 100 %.  Long-term benefit: Defined as any relief past the pharmacologic duration of the local anesthetics.  Effectiveness past the initial 6 hours after procedure(Long-Term Relief): 80 %.  Pharmacotherapy Assessment  Analgesic: Hydrocodone/APAP 5/325, 1 tab PO QD PRN (5 mg/day of hydrocodone) MME: 5 mg/day.   Follow-up plan:   Reschedule Visit.     Interventional  management options:  Considering:   NOTE:COUMADINANTICOAGULATION(Stop:5days  Re-start: 2hrs) NO RFAuntil BMIis <35. Diagnostic L5-S1 LESI Diagnostic bilateral L3TFESI Diagnostic bilateral L4TFESI Diagnostic bilateral L5 TFESI Diagnostic left SI joint block #1  Possible righ SI joint RFA Possible bilateral lumbar facet RFA(NO RFAuntil BMIis <35.) Diagnostic right IA hip joint injection Diagnostic right femoral +ObturatorNB Possible right femoral + obturator nerve RFA   Palliative PRN treatment(s):   Palliative/Therapeutic left L4-5 LESI #2 Palliative left lumbar facet block#6 Palliativeright lumbar facet block(s) #10 Palliative right SI joint block #4(not as effective as facet blocks) Diagnostic left SI joint block #2 (not as effective in controlling the low back pain as the facet injections) Palliative/Therapeutic (Midline) caudalESI#4     Recent Visits Date Type Provider Dept  03/13/20 Procedure visit Milinda Pointer, MD [name]  03/10/20 Telemedicine Milinda Pointer, MD [name]  02/25/20 Telemedicine Milinda Pointer, MD [name]  01/29/20 Procedure visit Milinda Pointer, MD [name]  01/16/20 Office Visit Milinda Pointer, MD [name]  Showing recent visits within past 90 days and meeting all other requirements Today's Visits Date Type Provider Dept  03/27/20 Telemedicine Milinda Pointer, MD [name]  Showing today's visits and meeting all other requirements Future Appointments No visits were found meeting these conditions. Showing future appointments within next 90 days and meeting all other requirements   Note by: Gaspar Cola, MD Date: 03/27/2020; Time: 3:58 PM

## 2020-03-27 ENCOUNTER — Other Ambulatory Visit: Payer: Self-pay

## 2020-03-27 ENCOUNTER — Ambulatory Visit: Payer: PPO | Attending: Pain Medicine | Admitting: Pain Medicine

## 2020-03-27 DIAGNOSIS — G894 Chronic pain syndrome: Secondary | ICD-10-CM

## 2020-03-27 DIAGNOSIS — M545 Low back pain, unspecified: Secondary | ICD-10-CM

## 2020-03-27 DIAGNOSIS — M47816 Spondylosis without myelopathy or radiculopathy, lumbar region: Secondary | ICD-10-CM

## 2020-03-27 DIAGNOSIS — G8929 Other chronic pain: Secondary | ICD-10-CM

## 2020-03-31 DIAGNOSIS — Z7901 Long term (current) use of anticoagulants: Secondary | ICD-10-CM | POA: Diagnosis not present

## 2020-04-02 ENCOUNTER — Encounter: Payer: Self-pay | Admitting: Pain Medicine

## 2020-04-02 NOTE — Progress Notes (Signed)
Patient: Kara [name]  Service Category: E/M  Provider: Gaspar Cola, MD  DOB: 05-14-1951  DOS: 04/03/2020  Location: Office  MRN: [id number]  Setting: Ambulatory outpatient  Referring Provider: Tracie Harrier, MD  Type: Established Patient  Specialty: Interventional Pain Management  PCP: Tracie Harrier, MD  Location: Remote location  Delivery: TeleHealth     Virtual Encounter - Pain Management PROVIDER NOTE: Information contained herein reflects review and annotations entered in association with encounter. Interpretation of such information and data should be left to medically-trained personnel. Information provided to patient can be located elsewhere in the medical record under "Patient Instructions". Document created using STT-dictation technology, any transcriptional errors that may result from process are unintentional.    Contact & Pharmacy Preferred: [phone] Home: [phone] (home) Mobile: [phone] (mobile) E-mail: [email or web address]  [place], [place] [place] [address] Phone: [phone] Fax: [phone]   Pre-screening  Ms. Baksh offered "in-person" vs "virtual" encounter. She indicated preferring virtual for this encounter.   Reason COVID-19*  Social distancing based on CDC and AMA recommendations.   I contacted [name] on 04/03/2020 via telephone.      I clearly identified myself as Gaspar Cola, MD. I verified that I was speaking with the correct person using two identifiers (Name: TRUTH WOLAVER, and date of birth: December 23, 1950).  Consent I sought verbal advanced consent from [name] for virtual visit interactions. I informed Ms. Kolk of possible security and privacy concerns, risks, and limitations associated with providing "not-in-person" medical evaluation and management services. I also informed Ms. Nault of the  availability of "in-person" appointments. Finally, I informed her that there would be a charge for the virtual visit and that she could be  personally, fully or partially, financially responsible for it. Ms. Killilea expressed understanding and agreed to proceed.   Historic Elements   Kara [name] is a 69 y.o. year old, female patient evaluated today after our last contact on 03/13/2020. 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 She also  has a past surgical history that includes Knee surgery; Carpal tunnel release (Bilateral); Colon resection sigmoid (N/A, 11/02/2016); Colostomy (N/A, 11/02/2016); Sigmoidoscopy (N/A, 11/13/2016); PULMONARY VENOGRAPHY (N/A, 11/19/2016); Colonoscopy with propofol (N/A, 02/03/2017); Colon surgery (11/02/2016); IVC FILTER INSERTION (Right, 10/2016); Colostomy reversal (N/A, 02/24/2017); Appendectomy (N/A, 02/24/2017); Lysis of adhesion (N/A, 02/24/2017); laparoscopy (N/A, 02/24/2017); Ileo loop diversion (N/A, 02/24/2017); Sigmoidoscopy (N/A, 05/19/2017); Ileostomy closure (N/A, 06/01/2017); IVC FILTER REMOVAL (N/A, 08/09/2017); Sacroplasty (N/A, 11/08/2017); Cataract extraction w/PHACO (Left, 11/21/2018); and Cataract extraction w/PHACO (Right, 12/12/2018). Kara [name] has a current medication list which includes the following prescription(s): aspirin ec, calcium carbonate, calcium polycarbophil, candesartan, vitamin d3, cyclobenzaprine, dulaglutide, gabapentin, gabapentin, glipizide, glipizide, hydrocodone-acetaminophen, meclizine, multi-vitamins, nystatin cream, ropinirole, tolterodine, vitamin b-12, warfarin, warfarin, and tramadol. She  reports that she has never smoked. She has  never used smokeless tobacco. She reports that she does not drink alcohol and does not use drugs. Kara [name] is allergic to adhesive [tape], other, latex, and morphine and related.   HPI  Today, she is being contacted for a post-procedure assessment.  The patient indicates having attained 100% relief of the pain for the duration of the local anesthetic, which then went down to an 80%  improvement.  Discontinue for several weeks, but at this point it is beginning to go down again and she is having some discomfort with the pain.  She refers having to use more pain medicine than usual.  In her case this means actually using 1 pill at bedtime.  I asked her if she had any kind of problems taking her hydrocodone during the day and she indicated that she cannot do that because it makes her very sleepy.  In view of this, today we have decided to do a trial of tramadol 50 mg where she can take half to 1 pill every 8 hours, if needed.  I asked her to do this trial during the weekend, so that she can figure out if taking 1 pill makes her sleepy.  If it does not, then we will switch her medication from the hydrocodone to the tramadol.  At this point I would like to avoid having to repeat this procedure any sooner than every other month.  I have been trying to encourage the patient to lose weight since this would probably help with the pain and it would also allow [place] to proceed with radiofrequency ablation so that we can get longer lasting benefit.  However, we have had absolutely no improvement on that part of her care.  Post-Procedure Evaluation  Procedure (03/13/2020): Palliative bilateral lumbar facet block under fluoroscopic guidance and IV sedation Pre-procedure pain level: 10/10 Post-procedure: 9/10 (< 50% relief)  Sedation: Sedation provided.  Effectiveness during initial hour after procedure(Ultra-Short Term Relief): 100 %.  Local anesthetic used: Long-acting (4-6 hours) Effectiveness: Defined as  any analgesic benefit obtained secondary to the administration of local anesthetics. This carries significant diagnostic value as to the etiological location, or anatomical origin, of the pain. Duration of benefit is expected to coincide with the duration of the local anesthetic used.  Effectiveness during initial 4-6 hours after procedure(Short-Term Relief): 100 %.  Long-term benefit: Defined as any relief past the pharmacologic duration of the local anesthetics.  Effectiveness past the initial 6 hours after procedure(Long-Term Relief): 80 %.  Current benefits: Defined as benefit that persist at this time.   Analgesia:  >75% relief Function: Somewhat improved ROM: Somewhat improved  Pharmacotherapy Assessment  Analgesic: Hydrocodone/APAP 5/325, 1 tab PO QD PRN (5 mg/day of hydrocodone) MME: 5 mg/day.   Monitoring: [place] PMP: PDMP reviewed during this encounter.       Pharmacotherapy: No side-effects or adverse reactions reported. Compliance: No problems identified. Effectiveness: Clinically acceptable. Plan: Refer to "POC".  UDS:  Summary  Date Value Ref Range Status  09/20/2019 Note  Final    Comment:    ==================================================================== [employer] Select [employer] (MW) ==================================================================== Test                             Result       Flag       Units Drug Absent but Declared for Prescription Verification   Hydrocodone                    Not Detected UNEXPECTED ng/mg creat ==================================================================== Test                      Result    Flag   Units      Ref Range   Creatinine              126  mg/dL      >=20 ==================================================================== Declared Medications:  The flagging and interpretation on this report are based on the  following declared medications.  Unexpected results may arise from  inaccuracies in the  declared medications.  **Note: The testing scope of this panel includes these medications:  Hydrocodone  **Note: The testing scope of this panel does not include the  following reported medications:  Acetaminophen  Aspirin  Calcium  Candesartan  Cholecalciferol  Cyanocobalamin  Cyclobenzaprine  Dulaglutide  Gabapentin  Glipizide  Loperamide  Meclizine  Multivitamin  Nystatin  Ropinirole  Warfarin ==================================================================== For clinical consultation, please call [phone]. ====================================================================     Laboratory Chemistry Profile   Renal Lab Results  Component Value Date   BUN 10 07/09/2019   CREATININE 0.63 07/09/2019   GFRAA >60 07/09/2019   GFRNONAA >60 07/09/2019     Hepatic Lab Results  Component Value Date   AST 38 07/09/2019   ALT 43 07/09/2019   ALBUMIN 3.6 07/09/2019   ALKPHOS 51 07/09/2019   LIPASE 23 07/09/2019     Electrolytes Lab Results  Component Value Date   NA 139 07/09/2019   K 3.7 07/09/2019   CL 110 07/09/2019   CALCIUM 8.5 (L) 07/09/2019   MG 2.0 11/02/2017   PHOS 3.3 11/07/2016     Bone Lab Results  Component Value Date   25OHVITD1 25 (L) 11/02/2017   25OHVITD2 <1.0 11/02/2017   25OHVITD3 25 11/02/2017     Inflammation (CRP: Acute Phase) (ESR: Chronic Phase) Lab Results  Component Value Date   CRP 5.3 (H) 11/02/2017   ESRSEDRATE 12 11/02/2017       Note: Above Lab results reviewed.  Imaging  DG PAIN CLINIC C-ARM 1-60 MIN NO REPORT Fluoro was used, but no Radiologist interpretation will be provided.  Please refer to "NOTES" tab for provider progress note.  Assessment  The primary encounter diagnosis was Chronic pain syndrome. 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  Plan of Care  Problem-specific:  No problem-specific Assessment & Plan notes found for this encounter.  Ms. PHILIS DOKE has a current medication list which includes the following long-term medication(s): calcium carbonate, candesartan, glipizide, glipizide, hydrocodone-acetaminophen, ropinirole, tramadol, warfarin, and warfarin.  Pharmacotherapy (Medications Ordered): Meds ordered this encounter  Medications  . traMADol (ULTRAM) 50 MG tablet    Sig: Take 0.5-1 tablets (25-50 mg total) by mouth every 8 (eight) hours as needed for severe pain. Must last 30 days    Dispense:  90 tablet    Refill:  0    Chronic Pain: STOP Act (Not applicable) Fill 1 day early if closed on refill date. Avoid benzodiazepines within 8 hours of opioids   Orders:  No orders of the defined types were placed in this encounter.  Follow-up plan:   Return in about 10 days (around 04/13/2020) for (VV), (Med Mgmt) to evaluate tramadol trial.      Interventional management options:  Considering:   NOTE:COUMADINANTICOAGULATION(Stop:5days  Re-start: 2hrs) NO RFAuntil BMIis <35. Diagnostic L5-S1 LESI Diagnostic bilateral L3TFESI Diagnostic bilateral L4TFESI Diagnostic bilateral L5 TFESI Diagnostic left SI joint block #1  Possible righ  SI joint RFA Possible bilateral lumbar facet RFA(NO RFAuntil BMIis <35.) Diagnostic right IA hip joint injection Diagnostic right femoral +ObturatorNB Possible right femoral + obturator nerve RFA   Palliative PRN treatment(s):   Palliative/Therapeutic left L4-5 LESI #2 Palliative left lumbar facet block#6 Palliativeright lumbar facet block(s) #10 Palliative right SI joint block #4(not as  effective as facet blocks) Diagnostic left SI joint block #2 (not as effective in controlling the low back pain as the facet injections) Palliative/Therapeutic (Midline) caudalESI#4      Recent Visits Date Type Provider Dept  03/27/20 Telemedicine Milinda Pointer, MD [name]  03/13/20 Procedure visit Milinda Pointer, MD [name]  03/10/20 Telemedicine Milinda Pointer, MD [name]  02/25/20 Telemedicine Milinda Pointer, MD [name]  01/29/20 Procedure visit Milinda Pointer, MD [name]  01/16/20 Office Visit Milinda Pointer, MD [name]  Showing recent visits within past 90 days and meeting all other requirements Today's Visits Date Type Provider Dept  04/03/20 Telemedicine Milinda Pointer, MD [name]  Showing today's visits and meeting all other requirements Future Appointments No visits were found meeting these conditions. Showing future appointments within next 90 days and meeting all other requirements  I discussed the assessment and treatment plan with the patient. The patient was provided an opportunity to ask questions and all were answered. The patient agreed with the plan and demonstrated an understanding of the instructions.  Patient advised to call back or seek an in-person evaluation if the symptoms or condition worsens.  Duration of encounter: 15 minutes.  Note by: Gaspar Cola, MD Date: 04/03/2020; Time: 3:34 PM

## 2020-04-03 ENCOUNTER — Other Ambulatory Visit: Payer: Self-pay

## 2020-04-03 ENCOUNTER — Ambulatory Visit: Payer: PPO | Attending: Pain Medicine | Admitting: Pain Medicine

## 2020-04-03 DIAGNOSIS — M4300 Spondylolysis, site unspecified: Secondary | ICD-10-CM

## 2020-04-03 DIAGNOSIS — M47816 Spondylosis without myelopathy or radiculopathy, lumbar region: Secondary | ICD-10-CM | POA: Diagnosis not present

## 2020-04-03 DIAGNOSIS — G8929 Other chronic pain: Secondary | ICD-10-CM

## 2020-04-03 DIAGNOSIS — Z6841 Body Mass Index (BMI) 40.0 and over, adult: Secondary | ICD-10-CM | POA: Diagnosis not present

## 2020-04-03 DIAGNOSIS — M4306 Spondylolysis, lumbar region: Secondary | ICD-10-CM

## 2020-04-03 DIAGNOSIS — M5136 Other intervertebral disc degeneration, lumbar region: Secondary | ICD-10-CM | POA: Diagnosis not present

## 2020-04-03 DIAGNOSIS — M545 Low back pain, unspecified: Secondary | ICD-10-CM

## 2020-04-03 DIAGNOSIS — M4310 Spondylolisthesis, site unspecified: Secondary | ICD-10-CM

## 2020-04-03 DIAGNOSIS — G894 Chronic pain syndrome: Secondary | ICD-10-CM | POA: Diagnosis not present

## 2020-04-03 MED ORDER — TRAMADOL HCL 50 MG PO TABS: 25.0000 mg | ORAL_TABLET | Freq: Three times a day (TID) | ORAL | 0 refills | Status: DC | PRN

## 2020-04-03 NOTE — Patient Instructions (Signed)
Tramadol tablets What is this medicine? TRAMADOL (TRA ma dole) is a pain reliever. It is used to treat moderate to severe pain in adults. This medicine may be used for other purposes; ask your health care provider or pharmacist if you have questions. COMMON BRAND NAME(S): Ultram What should I tell my health care provider before I take this medicine? They need to know if you have any of these conditions:  brain tumor  depression  drug abuse or addiction  head injury  if you frequently drink alcohol containing drinks  kidney disease or trouble passing urine  liver disease  lung disease, asthma, or breathing problems  seizures or epilepsy  suicidal thoughts, plans, or attempt; a previous suicide attempt by you or a family member  an unusual or allergic reaction to tramadol, codeine, other medicines, foods, dyes, or preservatives  pregnant or trying to get pregnant  breast-feeding How should I use this medicine? Take this medicine by mouth with a full glass of water. Follow the directions on the prescription label. You can take it with or without food. If it upsets your stomach, take it with food. Do not take your medicine more often than directed. A special MedGuide will be given to you by the pharmacist with each prescription and refill. Be sure to read this information carefully each time. Talk to your pediatrician regarding the use of this medicine in children. Special care may be needed. Overdosage: If you think you have taken too much of this medicine contact a poison control center or emergency room at once. NOTE: This medicine is only for you. Do not share this medicine with others. What if I miss a dose? If you miss a dose, take it as soon as you can. If it is almost time for your next dose, take only that dose. Do not take double or extra doses. What may interact with this medicine? Do not take this medication with any of the following medicines:  MAOIs like Carbex,  Eldepryl, Marplan, Nardil, and Parnate This medicine may also interact with the following medications:  alcohol  antihistamines for allergy, cough and cold  certain medicines for anxiety or sleep  certain medicines for depression like amitriptyline, fluoxetine, sertraline  certain medicines for migraine headache like almotriptan, eletriptan, frovatriptan, naratriptan, rizatriptan, sumatriptan, zolmitriptan  certain medicines for seizures like carbamazepine, oxcarbazepine, phenobarbital, primidone  certain medicines that treat or prevent blood clots like warfarin  digoxin  furazolidone  general anesthetics like halothane, isoflurane, methoxyflurane, propofol  linezolid  local anesthetics like lidocaine, pramoxine, tetracaine  medicines that relax muscles for surgery  other narcotic medicines for pain or cough  phenothiazines like chlorpromazine, mesoridazine, prochlorperazine, thioridazine  procarbazine This list may not describe all possible interactions. Give your health care provider a list of all the medicines, herbs, non-prescription drugs, or dietary supplements you use. Also tell them if you smoke, drink alcohol, or use illegal drugs. Some items may interact with your medicine. What should I watch for while using this medicine? Tell your health care provider if your pain does not go away, if it gets worse, or if you have new or a different type of pain. You may develop tolerance to this drug. Tolerance means that you will need a higher dose of the drug for pain relief. Tolerance is normal and is expected if you take this drug for a long time. Do not suddenly stop taking your drug because you may develop a severe reaction. Your body becomes used to the  drug. This does NOT mean you are addicted. Addiction is a behavior related to getting and using a drug for a nonmedical reason. If you have pain, you have a medical reason to take pain drug. Your health care provider will tell  you how much drug to take. If your health care provider wants you to stop the drug, the dose will be slowly lowered over time to avoid any side effects. If you take other drugs that also cause drowsiness like other narcotic pain drugs, benzodiazepines, or other drugs for sleep, you may have more side effects. Give your health care provider a list of all drugs you use. He or she will tell you how much drug to take. Do not take more drug than directed. Get emergency help right away if you have trouble breathing or are unusually tired or sleepy. Talk to your health care provider about naloxone and how to get it. Naloxone is an emergency drug used for an opioid overdose. An overdose can happen if you take too much opioid. It can also happen if an opioid is taken with some other drugs or substances, like alcohol. Know the symptoms of an overdose, like trouble breathing, unusually tired or sleepy, or not being able to respond or wake up. Make sure to tell caregivers and close contacts where it is stored. Make sure they know how to use it. After naloxone is given, you must get emergency help right away. Naloxone is a temporary treatment. Repeat doses may be needed. This drug may cause serious skin reactions. They can happen weeks to months after starting the drug. Contact your health care provider right away if you notice fevers or flu-like symptoms with a rash. The rash may be red or purple and then turn into blisters or peeling of the skin. Or, you might notice a red rash with swelling of the face, lips, or lymph nodes in your neck or under your arms. You may get drowsy or dizzy. Do not drive, use machinery, or do anything that needs mental alertness until you know how this drug affects you. Do not stand up or sit up quickly, especially if you are an older patient. This reduces the risk of dizzy or fainting spells. Alcohol may interfere with the effect of this drug. Avoid alcoholic drinks. This drug will cause  constipation. If you do not have a bowel movement for 3 days, call your health care provider. Your mouth may get dry. Chewing sugarless gum or sucking hard candy and drinking plenty of water may help. Contact your health care provider if the problem does not go away or is severe. What side effects may I notice from receiving this medicine? Side effects that you should report to your doctor or health care professional as soon as possible:  allergic reactions like skin rash, itching or hives, swelling of the face, lips, or tongue  breathing problems  confusion  redness, blistering, peeling or loosening of the skin, including inside the mouth  seizures  signs and symptoms of low blood pressure like dizziness; feeling faint or lightheaded, falls; unusually weak or tired  trouble passing urine or change in the amount of urine Side effects that usually do not require medical attention (report to your doctor or health care professional if they continue or are bothersome):  constipation  dry mouth  nausea, vomiting  tiredness This list may not describe all possible side effects. Call your doctor for medical advice about side effects. You may report side effects to  FDA at 1-800-FDA-1088. Where should I keep my medicine? Keep out of the reach of children. This medicine may cause accidental overdose and death if it taken by other adults, children, or pets. Mix any unused medicine with a substance like cat litter or coffee grounds. Then throw the medicine away in a sealed container like a sealed bag or a coffee can with a lid. Do not use the medicine after the expiration date. Store at room temperature between 15 and 30 degrees C (59 and 86 degrees F). NOTE: This sheet is a summary. It may not cover all possible information. If you have questions about this medicine, talk to your doctor, pharmacist, or health care provider.  2020 Elsevier/Gold Standard (2018-12-18 13:08:25)

## 2020-04-10 ENCOUNTER — Encounter: Payer: Self-pay | Admitting: Pain Medicine

## 2020-04-13 NOTE — Progress Notes (Signed)
Patient: Kara [name]  Service Category: E/M  Provider: Gaspar Cola, MD  DOB: Nov 16, 1950  DOS: 04/14/2020  Location: Office  MRN: [id number]  Setting: Ambulatory outpatient  Referring Provider: Tracie Harrier, MD  Type: Established Patient  Specialty: Interventional Pain Management  PCP: Tracie Harrier, MD  Location: Remote location  Delivery: TeleHealth     Virtual Encounter - Pain Management PROVIDER NOTE: Information contained herein reflects review and annotations entered in association with encounter. Interpretation of such information and data should be left to medically-trained personnel. Information provided to patient can be located elsewhere in the medical record under "Patient Instructions". Document created using STT-dictation technology, any transcriptional errors that may result from process are unintentional.    Contact & Pharmacy Preferred: [phone] Home: [phone] (home) Mobile: [phone] (mobile) E-mail: [email or web address]  [place], [place] [place] [address] Phone: [phone] Fax: [phone]   Pre-screening  Ms. Costilow offered "in-person" vs "virtual" encounter. She indicated preferring virtual for this encounter.   Reason COVID-19*   Social distancing based on CDC and AMA recommendations.   I contacted [name] on 04/14/2020 via telephone.      I clearly identified myself as Gaspar Cola, MD. I verified that I was speaking with the correct person using two identifiers (Name: Kara [name], and date of birth: 1950/10/22).  Consent I sought verbal advanced consent from [name] for virtual visit interactions. I informed [name] of possible security and privacy concerns, risks, and limitations associated with providing "not-in-person" medical evaluation and management services. I also informed Ms. Podgurski of the  availability of "in-person" appointments. Finally, I informed her that there would be a charge for the virtual visit and that she could be  personally, fully or partially, financially responsible for it. [name] expressed understanding and agreed to proceed.   Historic Elements   [name] is a 69 y.o. year old, female patient evaluated today after our last contact on 03/13/2020. 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 She also  has a past surgical history that includes Knee surgery; Carpal tunnel release (Bilateral); Colon resection sigmoid (N/A, 11/02/2016); Colostomy (N/A, 11/02/2016); Sigmoidoscopy (N/A, 11/13/2016); PULMONARY VENOGRAPHY (N/A, 11/19/2016); Colonoscopy with propofol (N/A, 02/03/2017); Colon surgery (11/02/2016); IVC FILTER INSERTION (Right, 10/2016); Colostomy reversal (N/A, 02/24/2017); Appendectomy (N/A, 02/24/2017); Lysis of adhesion (N/A, 02/24/2017); laparoscopy (N/A, 02/24/2017); Ileo loop diversion (N/A, 02/24/2017); Sigmoidoscopy (N/A, 05/19/2017); Ileostomy closure (N/A, 06/01/2017); IVC FILTER REMOVAL (N/A, 08/09/2017); Sacroplasty (N/A, 11/08/2017); Cataract extraction w/PHACO (Left, 11/21/2018); and Cataract extraction w/PHACO (Right, 12/12/2018). [name] has a current medication list which includes the following prescription(s): aspirin ec, calcium carbonate, calcium polycarbophil, candesartan, vitamin d3, cyclobenzaprine, dulaglutide, gabapentin, gabapentin, glipizide, glipizide, hydrocodone-acetaminophen, meclizine, multi-vitamins, nystatin cream, ropinirole, tolterodine, tramadol, [START ON 05/03/2020] tramadol, vitamin b-12, warfarin, and warfarin. She  reports that  she has never smoked. She has never used smokeless tobacco. She reports that she does not drink alcohol and does not use drugs. [name] is allergic to adhesive [tape], other, latex, and morphine and related.   HPI  Today, she is being contacted for medication management.  The patient is being contacted to evaluate the results of the tramadol trial.  Prescription for tramadol 50 mg tablet,  0.5-1 tab (25-50 mg) p.o. 3 times daily #90, filled on 04/03/2020.  Today to me for tries to be able to get a hold of her.  Finally when I did she indicated that she had tried the medication during the weekend and did not have any problems with it.  In fact, she was able to tolerate the entire pill without getting sleepy.  She describes how she has now gone up to taking 1 tablet p.o. twice daily and she is now able to do more than before.  [name] indicates that when she takes the 1 pill it would last 3 to 4 hours.  This is accurate and this should be the normal duration of the tramadol.  In view of that, I asked her if she felt that she could be able to handle 1 tablet p.o. 4 times daily but she indicated that she rather not.  She is willing to try up to 3 tablets a day and she has noticed how urine today she has absolutely no problem with the medicine and it does not make her sleepy.  It is night that she has noticed that when she gets home tired and takes medicine it makes her very sleepy.  This is likely to be a combination of the physical exhaustion and the medicine rather than just the medicine working better at nighttime.  In any case, today I have sent a prescription to her pharmacy so that she can take 1 tablet p.o. every 8 hours #90.  She asked me what to do with her hydrocodone and I told her to hold onto it but not to use it unless it is absolutely necessary.  I also instructed her not to take it within 4 hours of having taken the tramadol and also not to take any tramadol within 4 hours of having  taken the hydrocodone.  She understood and accepted.  I made it clear that the plan was not to be switching between medications but that she should keep the hydrocodone safely stored away just in case she has a flareup and needs additional medication, in which case I instructed her to give [place] a call before embarking on adding anything else.  She again indicated having understood that clearly.  RTCB: 08/01/2020 Nonopioids transferred 03/27/2020: Calcium  Pharmacotherapy Assessment  Analgesic: Hydrocodone/APAP 5/325, 1 tab PO QD PRN (5 mg/day of hydrocodone) MME: 5 mg/day.   Monitoring: [place] PMP: PDMP reviewed during this encounter.       Pharmacotherapy: No side-effects or adverse reactions reported. Compliance: No problems identified. Effectiveness: Clinically acceptable. Plan: Refer to "POC".  UDS:  Summary  Date Value Ref Range Status  09/20/2019 Note  Final    Comment:    ==================================================================== [facility] Select 13 (MW) ==================================================================== Test                             Result       Flag       Units Drug Absent but Declared for Prescription Verification   Hydrocodone                    Not Detected UNEXPECTED ng/mg creat ==================================================================== Test                      Result    Flag   Units      Ref Range   Creatinine  126              mg/dL      >=20 ==================================================================== Declared Medications:  The flagging and interpretation on this report are based on the  following declared medications.  Unexpected results may arise from  inaccuracies in the declared medications.  **Note: The testing scope of this panel includes these medications:  Hydrocodone  **Note: The testing scope of this panel does not include the  following reported medications:  Acetaminophen  Aspirin  Calcium   Candesartan  Cholecalciferol  Cyanocobalamin  Cyclobenzaprine  Dulaglutide  Gabapentin  Glipizide  Loperamide  Meclizine  Multivitamin  Nystatin  Ropinirole  Warfarin ==================================================================== For clinical consultation, please call [phone]. ====================================================================     Laboratory Chemistry Profile   Renal Lab Results  Component Value Date   BUN 10 07/09/2019   CREATININE 0.63 07/09/2019   GFRAA >60 07/09/2019   GFRNONAA >60 07/09/2019     Hepatic Lab Results  Component Value Date   AST 38 07/09/2019   ALT 43 07/09/2019   ALBUMIN 3.6 07/09/2019   ALKPHOS 51 07/09/2019   LIPASE 23 07/09/2019     Electrolytes Lab Results  Component Value Date   NA 139 07/09/2019   K 3.7 07/09/2019   CL 110 07/09/2019   CALCIUM 8.5 (L) 07/09/2019   MG 2.0 11/02/2017   PHOS 3.3 11/07/2016     Bone Lab Results  Component Value Date   25OHVITD1 25 (L) 11/02/2017   25OHVITD2 <1.0 11/02/2017   25OHVITD3 25 11/02/2017     Inflammation (CRP: Acute Phase) (ESR: Chronic Phase) Lab Results  Component Value Date   CRP 5.3 (H) 11/02/2017   ESRSEDRATE 12 11/02/2017       Note: Above Lab results reviewed.  Imaging  DG PAIN CLINIC C-ARM 1-60 MIN NO REPORT Fluoro was used, but no Radiologist interpretation will be provided.  Please refer to "NOTES" tab for provider progress note.  Assessment  The primary encounter diagnosis was Chronic pain syndrome. Diagnoses of Chronic low back pain (1ry area of Pain) (Bilateral) w/o sciatica and Pharmacologic therapy were also pertinent to this visit.  Plan of Care  Problem-specific:  No problem-specific Assessment & Plan notes found for this encounter.  Ms. ADRIENA MANFRE has a current medication list which includes the following long-term medication(s): calcium carbonate, candesartan, glipizide, glipizide, hydrocodone-acetaminophen,  ropinirole, tramadol, [START ON 05/03/2020] tramadol, warfarin, and warfarin.  Pharmacotherapy (Medications Ordered): Meds ordered this encounter  Medications   traMADol (ULTRAM) 50 MG tablet    Sig: Take 1 tablet (50 mg total) by mouth every 8 (eight) hours as needed for severe pain. Must last 30 days    Dispense:  90 tablet    Refill:  2    Chronic Pain: STOP Act (Not applicable) Fill 1 day early if closed on refill date. Avoid benzodiazepines within 8 hours of opioids   Orders:  No orders of the defined types were placed in this encounter.  Follow-up plan:   Return in about 4 months (around 08/01/2020) for (F2F), (Med Mgmt).      Interventional management options:  Considering:   NOTE:COUMADINANTICOAGULATION(Stop:5days   Re-start: 2hrs) NO RFAuntil BMIis <35. Diagnostic L5-S1 LESI Diagnostic bilateral L3TFESI Diagnostic bilateral L4TFESI Diagnostic bilateral L5 TFESI Diagnostic left SI joint block #1  Possible righ SI joint RFA Possible bilateral lumbar facet RFA(NO RFAuntil BMIis <35.) Diagnostic right IA hip joint injection Diagnostic right femoral +ObturatorNB Possible right femoral + obturator nerve RFA  Palliative PRN treatment(s):   Palliative/Therapeutic left L4-5 LESI #2 Palliative left lumbar facet block#6 Palliativeright lumbar facet block(s) #10 Palliative right SI joint block #4(not as effective as facet blocks) Diagnostic left SI joint block #2 (not as effective in controlling the low back pain as the facet injections) Palliative/Therapeutic (Midline) caudalESI#4    Recent Visits Date Type Provider Dept  04/03/20 Telemedicine Milinda Pointer, [facility]  03/27/20 Telemedicine Milinda Pointer, MD [name]  03/13/20 Procedure visit Milinda Pointer, MD [name]  03/10/20 Telemedicine Milinda Pointer, MD [name]  02/25/20 Telemedicine Milinda Pointer, MD  [name]  01/29/20 Procedure visit Milinda Pointer, MD [name]  01/16/20 Office Visit Milinda Pointer, MD [name]  Showing recent visits within past 90 days and meeting all other requirements Today's Visits Date Type Provider Dept  04/14/20 Telemedicine Milinda Pointer, MD [name]  Showing today's visits and meeting all other requirements Future Appointments No visits were found meeting these conditions. Showing future appointments within next 90 days and meeting all other requirements  I discussed the assessment and treatment plan with the patient. The patient was provided an opportunity to ask questions and all were answered. The patient agreed with the plan and demonstrated an understanding of the instructions.  Patient advised to call back or seek an in-person evaluation if the symptoms or condition worsens.  Duration of encounter: 15 minutes.  Note by: Gaspar Cola, MD Date: 04/14/2020; Time: 1:49 PM

## 2020-04-14 ENCOUNTER — Other Ambulatory Visit: Payer: Self-pay

## 2020-04-14 ENCOUNTER — Ambulatory Visit: Payer: PPO | Attending: Pain Medicine | Admitting: Pain Medicine

## 2020-04-14 DIAGNOSIS — M545 Low back pain, unspecified: Secondary | ICD-10-CM | POA: Diagnosis not present

## 2020-04-14 DIAGNOSIS — Z79899 Other long term (current) drug therapy: Secondary | ICD-10-CM | POA: Diagnosis not present

## 2020-04-14 DIAGNOSIS — G894 Chronic pain syndrome: Secondary | ICD-10-CM

## 2020-04-14 DIAGNOSIS — G8929 Other chronic pain: Secondary | ICD-10-CM

## 2020-04-14 MED ORDER — TRAMADOL HCL 50 MG PO TABS: 50.0000 mg | ORAL_TABLET | Freq: Three times a day (TID) | ORAL | 2 refills | Status: DC | PRN

## 2020-04-22 ENCOUNTER — Telehealth: Payer: Self-pay

## 2020-04-22 ENCOUNTER — Encounter: Payer: Self-pay | Admitting: Pain Medicine

## 2020-04-22 NOTE — Telephone Encounter (Signed)
LM for patinet to call office for virtual appointment questions.

## 2020-04-23 ENCOUNTER — Ambulatory Visit: Payer: PPO | Attending: Pain Medicine | Admitting: Pain Medicine

## 2020-04-23 ENCOUNTER — Other Ambulatory Visit: Payer: Self-pay

## 2020-04-23 DIAGNOSIS — M545 Low back pain, unspecified: Secondary | ICD-10-CM | POA: Diagnosis not present

## 2020-04-23 DIAGNOSIS — G894 Chronic pain syndrome: Secondary | ICD-10-CM

## 2020-04-23 DIAGNOSIS — Z79899 Other long term (current) drug therapy: Secondary | ICD-10-CM | POA: Diagnosis not present

## 2020-04-23 DIAGNOSIS — G8929 Other chronic pain: Secondary | ICD-10-CM | POA: Diagnosis not present

## 2020-04-23 DIAGNOSIS — M47816 Spondylosis without myelopathy or radiculopathy, lumbar region: Secondary | ICD-10-CM

## 2020-04-23 NOTE — Patient Instructions (Signed)
____________________________________________________________________________________________  Preparing for Procedure with Sedation  Procedure appointments are limited to planned procedures: . No Prescription Refills. . No disability issues will be discussed. . No medication changes will be discussed.  Instructions: . Oral Intake: Do not eat or drink anything for at least 8 hours prior to your procedure. (Exception: Blood Pressure Medication. See below.) . Transportation: Unless otherwise stated by your physician, you may drive yourself after the procedure. . Blood Pressure Medicine: Do not forget to take your blood pressure medicine with a sip of water the morning of the procedure. If your Diastolic (lower reading)is above 100 mmHg, elective cases will be cancelled/rescheduled. . Blood thinners: These will need to be stopped for procedures. Notify our staff if you are taking any blood thinners. Depending on which one you take, there will be specific instructions on how and when to stop it. . Diabetics on insulin: Notify the staff so that you can be scheduled 1st case in the morning. If your diabetes requires high dose insulin, take only  of your normal insulin dose the morning of the procedure and notify the staff that you have done so. . Preventing infections: Shower with an antibacterial soap the morning of your procedure. . Build-up your immune system: Take 1000 mg of Vitamin C with every meal (3 times a day) the day prior to your procedure. . Antibiotics: Inform the staff if you have a condition or reason that requires you to take antibiotics before dental procedures. . Pregnancy: If you are pregnant, call and cancel the procedure. . Sickness: If you have a cold, fever, or any active infections, call and cancel the procedure. . Arrival: You must be in the facility at least 30 minutes prior to your scheduled procedure. . Children: Do not bring children with you. . Dress appropriately:  Bring dark clothing that you would not mind if they get stained. . Valuables: Do not bring any jewelry or valuables.  Reasons to call and reschedule or cancel your procedure: (Following these recommendations will minimize the risk of a serious complication.) . Surgeries: Avoid having procedures within 2 weeks of any surgery. (Avoid for 2 weeks before or after any surgery). . Flu Shots: Avoid having procedures within 2 weeks of a flu shots or . (Avoid for 2 weeks before or after immunizations). . Barium: Avoid having a procedure within 7-10 days after having had a radiological study involving the use of radiological contrast. (Myelograms, Barium swallow or enema study). . Heart attacks: Avoid any elective procedures or surgeries for the initial 6 months after a "Myocardial Infarction" (Heart Attack). . Blood thinners: It is imperative that you stop these medications before procedures. Let us know if you if you take any blood thinner.  . Infection: Avoid procedures during or within two weeks of an infection (including chest colds or gastrointestinal problems). Symptoms associated with infections include: Localized redness, fever, chills, night sweats or profuse sweating, burning sensation when voiding, cough, congestion, stuffiness, runny nose, sore throat, diarrhea, nausea, vomiting, cold or Flu symptoms, recent or current infections. It is specially important if the infection is over the area that we intend to treat. . Heart and lung problems: Symptoms that may suggest an active cardiopulmonary problem include: cough, chest pain, breathing difficulties or shortness of breath, dizziness, ankle swelling, uncontrolled high or unusually low blood pressure, and/or palpitations. If you are experiencing any of these symptoms, cancel your procedure and contact your primary care physician for an evaluation.  Remember:  Regular Business hours are:    Monday to Thursday 8:00 AM to 4:00 PM  Provider's  Schedule: Kirbie Stodghill, MD:  Procedure days: Tuesday and Thursday 7:30 AM to 4:00 PM  Bilal Lateef, MD:  Procedure days: Monday and Wednesday 7:30 AM to 4:00 PM ____________________________________________________________________________________________   ____________________________________________________________________________________________  General Risks and Possible Complications  Patient Responsibilities: It is important that you read this as it is part of your informed consent. It is our duty to inform you of the risks and possible complications associated with treatments offered to you. It is your responsibility as a patient to read this and to ask questions about anything that is not clear or that you believe was not covered in this document.  Patient's Rights: You have the right to refuse treatment. You also have the right to change your mind, even after initially having agreed to have the treatment done. However, under this last option, if you wait until the last second to change your mind, you may be charged for the materials used up to that point.  Introduction: Medicine is not an exact science. Everything in Medicine, including the lack of treatment(s), carries the potential for danger, harm, or loss (which is by definition: Risk). In Medicine, a complication is a secondary problem, condition, or disease that can aggravate an already existing one. All treatments carry the risk of possible complications. The fact that a side effects or complications occurs, does not imply that the treatment was conducted incorrectly. It must be clearly understood that these can happen even when everything is done following the highest safety standards.  No treatment: You can choose not to proceed with the proposed treatment alternative. The "PRO(s)" would include: avoiding the risk of complications associated with the therapy. The "CON(s)" would include: not getting any of the treatment  benefits. These benefits fall under one of three categories: diagnostic; therapeutic; and/or palliative. Diagnostic benefits include: getting information which can ultimately lead to improvement of the disease or symptom(s). Therapeutic benefits are those associated with the successful treatment of the disease. Finally, palliative benefits are those related to the decrease of the primary symptoms, without necessarily curing the condition (example: decreasing the pain from a flare-up of a chronic condition, such as incurable terminal cancer).  General Risks and Complications: These are associated to most interventional treatments. They can occur alone, or in combination. They fall under one of the following six (6) categories: no benefit or worsening of symptoms; bleeding; infection; nerve damage; allergic reactions; and/or death. 1. No benefits or worsening of symptoms: In Medicine there are no guarantees, only probabilities. No healthcare provider can ever guarantee that a medical treatment will work, they can only state the probability that it may. Furthermore, there is always the possibility that the condition may worsen, either directly, or indirectly, as a consequence of the treatment. 2. Bleeding: This is more common if the patient is taking a blood thinner, either prescription or over the counter (example: Goody Powders, Fish oil, Aspirin, Garlic, etc.), or if suffering a condition associated with impaired coagulation (example: Hemophilia, cirrhosis of the liver, low platelet counts, etc.). However, even if you do not have one on these, it can still happen. If you have any of these conditions, or take one of these drugs, make sure to notify your treating physician. 3. Infection: This is more common in patients with a compromised immune system, either due to disease (example: diabetes, cancer, human immunodeficiency virus [HIV], etc.), or due to medications or treatments (example: therapies used to treat  cancer and   rheumatological diseases). However, even if you do not have one on these, it can still happen. If you have any of these conditions, or take one of these drugs, make sure to notify your treating physician. 4. Nerve Damage: This is more common when the treatment is an invasive one, but it can also happen with the use of medications, such as those used in the treatment of cancer. The damage can occur to small secondary nerves, or to large primary ones, such as those in the spinal cord and brain. This damage may be temporary or permanent and it may lead to impairments that can range from temporary numbness to permanent paralysis and/or brain death. 5. Allergic Reactions: Any time a substance or material comes in contact with our body, there is the possibility of an allergic reaction. These can range from a mild skin rash (contact dermatitis) to a severe systemic reaction (anaphylactic reaction), which can result in death. 6. Death: In general, any medical intervention can result in death, most of the time due to an unforeseen complication. ____________________________________________________________________________________________   

## 2020-04-23 NOTE — Progress Notes (Signed)
Patient: Kara [name]  Service Category: E/M  Provider: Gaspar Cola, MD  DOB: 1951-01-02  DOS: 04/23/2020  Location: Office  MRN: [id number]  Setting: Ambulatory outpatient  Referring Provider: Tracie Harrier, MD  Type: Established Patient  Specialty: Interventional Pain Management  PCP: Tracie Harrier, MD  Location: Remote location  Delivery: TeleHealth     Virtual Encounter - Pain Management PROVIDER NOTE: Information contained herein reflects review and annotations entered in association with encounter. Interpretation of such information and data should be left to medically-trained personnel. Information provided to patient can be located elsewhere in the medical record under "Patient Instructions". Document created using STT-dictation technology, any transcriptional errors that may result from process are unintentional.    Contact & Pharmacy Preferred: [phone] Home: There is no home phone number on file. Mobile: [phone] (mobile) E-mail: [email or web address]  [place], [address] [place] [address] Phone: [phone] Fax: [phone]   Pre-screening  Kara [name] offered "in-person" vs "virtual" encounter. She indicated preferring virtual for this encounter.   Reason COVID-19*  Social distancing based on CDC and AMA recommendations.   I contacted Kara [name] on 04/23/2020 via telephone.      I clearly identified myself as Gaspar Cola, MD. I verified that I was speaking with the correct person using two identifiers (Name: Kara [name], and date of birth: 1950/07/22).  Consent I sought verbal advanced consent from Kara [name] for virtual visit interactions. I informed Kara [name] of possible security and privacy concerns, risks, and limitations associated with providing "not-in-person" medical evaluation and management services. I also informed Ms.  [name] of the availability of "in-person" appointments. Finally, I informed her that there would be a charge for the virtual visit and that she could be  personally, fully or partially, financially responsible for it. Kara [name] expressed understanding and agreed to proceed.   Historic Elements   Kara [name] is a 69 y.o. year old, female patient evaluated today after our last contact on 03/13/2020. 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 She also  has a past surgical history that includes Knee surgery; Carpal tunnel release (Bilateral); Colon resection sigmoid (N/A, 11/02/2016); Colostomy (N/A, 11/02/2016); Sigmoidoscopy (N/A, 11/13/2016); PULMONARY VENOGRAPHY (N/A, 11/19/2016); Colonoscopy with propofol (N/A, 02/03/2017); Colon surgery (11/02/2016); IVC FILTER INSERTION (Right, 10/2016); Colostomy reversal (N/A, 02/24/2017); Appendectomy (N/A, 02/24/2017); Lysis of adhesion (N/A, 02/24/2017); laparoscopy (N/A, 02/24/2017); Ileo loop diversion (N/A, 02/24/2017); Sigmoidoscopy (N/A, 05/19/2017); Ileostomy closure (N/A, 06/01/2017); IVC FILTER REMOVAL (N/A, 08/09/2017); Sacroplasty (N/A, 11/08/2017); Cataract extraction w/PHACO (Left, 11/21/2018); and Cataract extraction w/PHACO (Right, 12/12/2018). Kara [name] has a current medication list which includes the following prescription(s): aspirin ec, calcium carbonate, calcium polycarbophil, candesartan, vitamin d3, cyclobenzaprine, dulaglutide, gabapentin, gabapentin, glipizide, glipizide, meclizine, multi-vitamins, nystatin cream, ropinirole, tolterodine, tramadol, [START ON 05/03/2020] tramadol, vitamin b-12, warfarin, warfarin, and  hydrocodone-acetaminophen. She  reports that she has never smoked. She has never used smokeless tobacco. She reports that she does not drink alcohol and does not use drugs. Kara [name] is allergic to adhesive [tape], other, latex, and morphine and related.   HPI  Today, she is being contacted for medication management.  On the patient's last visit we had started her on the tramadol 50 mg p.o.  3 times daily, which she was able to tolerate.  Unfortunately, she refers that she can tolerate it but it is really not helping that much with the pain.  Therefore, today I have instructed her to to try going to 2 tablets (100 mg) 3 times daily to 4 times daily.  I told her that she can do this as long as it does not make her too sleepy and she feels that she can still function well.  She indicates having an increase in her low back pain which started on the right side and now it has moved onto the left.  The pain is currently bilateral and going down to her buttocks and the typical facet joint pattern that she has had in the past.  She has requested that we bring her in for a palliative treatment.  Her last facet block was on 03/13/2020.  Pharmacotherapy Assessment  Analgesic: Hydrocodone/APAP 5/325, 1 tab PO QD PRN (5 mg/day of hydrocodone) MME: 5 mg/day.   Monitoring: [place] PMP: PDMP reviewed during this encounter.       Pharmacotherapy: No side-effects or adverse reactions reported. Compliance: No problems identified. Effectiveness: Clinically acceptable. Plan: Refer to "POC".  UDS:  Summary  Date Value Ref Range Status  09/20/2019 Note  Final    Comment:    ==================================================================== [facility] (MW) ==================================================================== Test                             Result       Flag       Units Drug Absent but Declared for Prescription Verification   Hydrocodone                    Not Detected UNEXPECTED  ng/mg creat ==================================================================== Test                      Result    Flag   Units      Ref Range   Creatinine              126              mg/dL      >=20 ==================================================================== Declared Medications:  The flagging and interpretation on this report are based on the  following declared medications.  Unexpected results may arise from  inaccuracies in the declared medications.  **Note: The testing scope of this panel includes these medications:  Hydrocodone  **Note: The testing scope of this panel does not include the  following reported medications:  Acetaminophen  Aspirin  Calcium  Candesartan  Cholecalciferol  Cyanocobalamin  Cyclobenzaprine  Dulaglutide  Gabapentin  Glipizide  Loperamide  Meclizine  Multivitamin  Nystatin  Ropinirole  Warfarin ==================================================================== For clinical consultation, please call [phone]. ====================================================================     Laboratory Chemistry Profile   Renal Lab Results  Component Value Date   BUN 10 07/09/2019   CREATININE 0.63 07/09/2019   GFRAA >60 07/09/2019   GFRNONAA >60 07/09/2019     Hepatic Lab Results  Component Value Date   AST 38 07/09/2019   ALT 43 07/09/2019   ALBUMIN 3.6 07/09/2019   ALKPHOS 51 07/09/2019   LIPASE 23 07/09/2019     Electrolytes Lab Results  Component Value Date   NA 139 07/09/2019   K 3.7 07/09/2019   CL 110 07/09/2019   CALCIUM 8.5 (L) 07/09/2019   MG 2.0 11/02/2017   PHOS 3.3  11/07/2016     Bone Lab Results  Component Value Date   25OHVITD1 25 (L) 11/02/2017   25OHVITD2 <1.0 11/02/2017   25OHVITD3 25 11/02/2017     Inflammation (CRP: Acute Phase) (ESR: Chronic Phase) Lab Results  Component Value Date   CRP 5.3 (H) 11/02/2017   ESRSEDRATE 12 11/02/2017       Note: Above Lab results  reviewed.  Imaging  DG PAIN CLINIC C-ARM 1-60 MIN NO REPORT Fluoro was used, but no Radiologist interpretation will be provided.  Please refer to "NOTES" tab for provider progress note.  Assessment  The primary encounter diagnosis was Chronic pain syndrome. Diagnoses of Chronic low back pain (1ry area of Pain) (Bilateral) w/o sciatica, Lumbar facet syndrome (Bilateral), and Pharmacologic therapy were also pertinent to this visit.  Plan of Care  Problem-specific:  No problem-specific Assessment & Plan notes found for this encounter.  Ms. JADDA HUNSUCKER has a current medication list which includes the following long-term medication(s): calcium carbonate, candesartan, glipizide, glipizide, ropinirole, tramadol, [START ON 05/03/2020] tramadol, warfarin, warfarin, and hydrocodone-acetaminophen.  Pharmacotherapy (Medications Ordered): No orders of the defined types were placed in this encounter.  Orders:  No orders of the defined types were placed in this encounter.  Follow-up plan:   Return for Procedure (w/ sedation): (B) L-FCT BLK.      Interventional management options:  Considering:   NOTE:COUMADINANTICOAGULATION(Stop:5days  Re-start: 2hrs) NO RFAuntil BMIis <35. Diagnostic L5-S1 LESI Diagnostic bilateral L3TFESI Diagnostic bilateral L4TFESI Diagnostic bilateral L5 TFESI Diagnostic left SI joint block #1  Possible righ SI joint RFA Possible bilateral lumbar facet RFA(NO RFAuntil BMIis <35.) Diagnostic right IA hip joint injection Diagnostic right femoral +ObturatorNB Possible right femoral + obturator nerve RFA   Palliative PRN treatment(s):   Palliative/Therapeutic left L4-5 LESI #2 Palliative left lumbar facet block#6 Palliativeright lumbar facet block(s) #10 Palliative right SI joint block #4(not as effective as facet blocks) Diagnostic left SI joint block #2 (not as effective in controlling the low back pain as the facet  injections) Palliative/Therapeutic (Midline) caudalESI#4     Recent Visits Date Type Provider Dept  04/14/20 Telemedicine Milinda Pointer, MD [name]  04/03/20 Telemedicine Milinda Pointer, MD [name]  03/27/20 Telemedicine Milinda Pointer, MD [name]  03/13/20 Procedure visit Milinda Pointer, MD [name]  03/10/20 Telemedicine Milinda Pointer, MD [name]  02/25/20 Telemedicine Milinda Pointer, MD [name]  01/29/20 Procedure visit Milinda Pointer, MD [name]  Showing recent visits within past 90 days and meeting all other requirements Today's Visits Date Type Provider Dept  04/23/20 Telemedicine Milinda Pointer, MD [name]  Showing today's visits and meeting all other requirements Future Appointments No visits were found meeting these conditions. Showing future appointments within next 90 days and meeting all other requirements  I discussed the assessment and treatment plan with the patient. The patient was provided an opportunity to ask questions and all were answered. The patient agreed with the plan and demonstrated an understanding of the instructions.  Patient advised to call back or seek an in-person evaluation if the symptoms or condition worsens.  Duration of encounter: 15 minutes.  Note by: Gaspar Cola, MD Date: 04/23/2020; Time: 5:23 PM

## 2020-04-29 ENCOUNTER — Other Ambulatory Visit: Payer: Self-pay

## 2020-04-29 ENCOUNTER — Ambulatory Visit
Admission: RE | Admit: 2020-04-29 | Discharge: 2020-04-29 | Disposition: A | Discharge status: Home / Self Care | Payer: PPO | Source: Ambulatory Visit | Attending: Pain Medicine | Admitting: Pain Medicine

## 2020-04-29 ENCOUNTER — Ambulatory Visit (HOSPITAL_BASED_OUTPATIENT_CLINIC_OR_DEPARTMENT_OTHER): Payer: PPO | Admitting: Pain Medicine

## 2020-04-29 ENCOUNTER — Encounter: Payer: Self-pay | Admitting: Pain Medicine

## 2020-04-29 VITALS — BP 150/77 | HR 87 | Temp 98.0°F | Resp 16 | Ht 63.0 in | Wt 217.0 lb

## 2020-04-29 DIAGNOSIS — M5137 Other intervertebral disc degeneration, lumbosacral region: Secondary | ICD-10-CM

## 2020-04-29 DIAGNOSIS — Z7901 Long term (current) use of anticoagulants: Secondary | ICD-10-CM

## 2020-04-29 DIAGNOSIS — Z91040 Latex allergy status: Secondary | ICD-10-CM | POA: Insufficient documentation

## 2020-04-29 DIAGNOSIS — M4310 Spondylolisthesis, site unspecified: Secondary | ICD-10-CM | POA: Diagnosis not present

## 2020-04-29 DIAGNOSIS — M47817 Spondylosis without myelopathy or radiculopathy, lumbosacral region: Secondary | ICD-10-CM | POA: Insufficient documentation

## 2020-04-29 DIAGNOSIS — M47816 Spondylosis without myelopathy or radiculopathy, lumbar region: Secondary | ICD-10-CM | POA: Diagnosis not present

## 2020-04-29 DIAGNOSIS — M545 Low back pain, unspecified: Secondary | ICD-10-CM | POA: Insufficient documentation

## 2020-04-29 DIAGNOSIS — Z6841 Body Mass Index (BMI) 40.0 and over, adult: Secondary | ICD-10-CM | POA: Diagnosis not present

## 2020-04-29 DIAGNOSIS — M4306 Spondylolysis, lumbar region: Secondary | ICD-10-CM | POA: Diagnosis not present

## 2020-04-29 DIAGNOSIS — G8929 Other chronic pain: Secondary | ICD-10-CM | POA: Diagnosis not present

## 2020-04-29 MED ORDER — KETOROLAC TROMETHAMINE 60 MG/2ML IM SOLN
60.0000 mg | Freq: Once | INTRAMUSCULAR | Status: AC
  Administered 2020-04-29: 60 mg via INTRAMUSCULAR

## 2020-04-29 MED ORDER — KETOROLAC TROMETHAMINE 60 MG/2ML IM SOLN
INTRAMUSCULAR | Status: AC
  Filled 2020-04-29: qty 2

## 2020-04-29 MED ORDER — MIDAZOLAM HCL 5 MG/5ML IJ SOLN
1.0000 mg | INTRAMUSCULAR | Status: DC | PRN
  Administered 2020-04-29: 1 mg via INTRAVENOUS
  Filled 2020-04-29: qty 5

## 2020-04-29 MED ORDER — LACTATED RINGERS IV SOLN
1000.0000 mL | Freq: Once | INTRAVENOUS | Status: AC
  Administered 2020-04-29: 1000 mL via INTRAVENOUS

## 2020-04-29 MED ORDER — ORPHENADRINE CITRATE 30 MG/ML IJ SOLN
60.0000 mg | Freq: Once | INTRAMUSCULAR | Status: AC
  Administered 2020-04-29: 60 mg via INTRAMUSCULAR

## 2020-04-29 MED ORDER — ORPHENADRINE CITRATE 30 MG/ML IJ SOLN
INTRAMUSCULAR | Status: AC
  Filled 2020-04-29: qty 2

## 2020-04-29 MED ORDER — LIDOCAINE HCL 2 % IJ SOLN
20.0000 mL | Freq: Once | INTRAMUSCULAR | Status: AC
  Administered 2020-04-29: 400 mg

## 2020-04-29 MED ORDER — TRIAMCINOLONE ACETONIDE 40 MG/ML IJ SUSP
80.0000 mg | Freq: Once | INTRAMUSCULAR | Status: AC
  Administered 2020-04-29: 80 mg
  Filled 2020-04-29: qty 2

## 2020-04-29 MED ORDER — FENTANYL CITRATE (PF) 100 MCG/2ML IJ SOLN
25.0000 ug | INTRAMUSCULAR | Status: DC | PRN
  Administered 2020-04-29: 50 ug via INTRAVENOUS
  Filled 2020-04-29: qty 2

## 2020-04-29 MED ORDER — ROPIVACAINE HCL 2 MG/ML IJ SOLN
18.0000 mL | Freq: Once | INTRAMUSCULAR | Status: AC
  Administered 2020-04-29: 18 mL via PERINEURAL
  Filled 2020-04-29: qty 20

## 2020-04-29 NOTE — Patient Instructions (Signed)

## 2020-04-29 NOTE — Progress Notes (Signed)
PROVIDER NOTE: Information contained herein reflects review and annotations entered in association with encounter. Interpretation of such information and data should be left to medically-trained personnel. Information provided to patient can be located elsewhere in the medical record under "Patient Instructions". Document created using STT-dictation technology, any transcriptional errors that may result from process are unintentional.    Patient: Kara [name]  Service Category: Procedure  Provider: Gaspar Cola, MD  DOB: [birth year]  DOS: 04/29/2020  Location: [facility]  MRN: [id number]  Setting: Ambulatory - outpatient  Referring Provider: Tracie Harrier, MD  Type: Established Patient  Specialty: Interventional Pain Management  PCP: Tracie Harrier, MD   Primary Reason for Visit: Interventional Pain Management Treatment. CC: Back Pain (lower)  Procedure:          Anesthesia, Analgesia, Anxiolysis:  Type: Lumbar Facet, Medial Branch Block(s)          Primary Purpose: Palliative Region: Posterolateral Lumbosacral Spine Level: L3, L4, L5, & S1 Medial Branch Level(s). Injecting these levels blocks the L4-5, and L5-S1 lumbar facet joints. Laterality: Bilateral  Type: Moderate (Conscious) Sedation combined with Local Anesthesia Indication(s): Analgesia and Anxiety Route: Intravenous (IV) IV Access: Secured Sedation: Meaningful verbal contact was maintained at all times during the procedure  Local Anesthetic: Lidocaine 1-2%  Position: Prone   Indications: 1. Lumbar facet syndrome (Bilateral)   2. Lumbar facet arthropathy (L3-4, L4-5, and L5-S1) (Bilateral)   3. 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 Osteoarthritis of lumbar spine   6. Spondylosis without myelopathy or radiculopathy, lumbosacral region   7. DDD (degenerative disc disease), lumbosacral   8. Chronic low back pain (1ry area of Pain)  (Bilateral) w/o sciatica   9. Morbid obesity with BMI of 40.0-44.9, adult (Bernie)   10. Chronic anticoagulation (COUMADIN)    Pain Score: Pre-procedure: 10-Worst pain ever/10 Post-procedure:  (Toradol/Norflex given per MD order)/10   Pre-op H&P Assessment:  Kara [name] is a 69 y.o. (year old), female patient, seen today for interventional treatment. She  has a past surgical history that includes Knee surgery; Carpal tunnel release (Bilateral); Colon resection sigmoid (N/A, 11/02/2016); Colostomy (N/A, 11/02/2016); Sigmoidoscopy (N/A, 11/13/2016); PULMONARY VENOGRAPHY (N/A, 11/19/2016); Colonoscopy with propofol (N/A, 02/03/2017); Colon surgery (11/02/2016); IVC FILTER INSERTION (Right, 10/2016); Colostomy reversal (N/A, 02/24/2017); Appendectomy (N/A, 02/24/2017); Lysis of adhesion (N/A, 02/24/2017); laparoscopy (N/A, 02/24/2017); Ileo loop diversion (N/A, 02/24/2017); Sigmoidoscopy (N/A, 05/19/2017); Ileostomy closure (N/A, 06/01/2017); IVC FILTER REMOVAL (N/A, 08/09/2017); Sacroplasty (N/A, 11/08/2017); Cataract extraction w/PHACO (Left, 11/21/2018); and Cataract extraction w/PHACO (Right, 12/12/2018). Kara [name] has a current medication list which includes the following prescription(s): aspirin ec, calcium carbonate, calcium polycarbophil, candesartan, vitamin d3, cyclobenzaprine, dulaglutide, gabapentin, gabapentin, glipizide, glipizide, meclizine, multi-vitamins, nystatin cream, ropinirole, tolterodine, tramadol, [START ON 05/03/2020] tramadol, vitamin b-12, warfarin, warfarin, and hydrocodone-acetaminophen, and the following Facility-Administered Medications: fentanyl and midazolam. Her primarily concern today is the Back Pain (lower)  Initial Vital Signs:  Pulse/HCG Rate: 87ECG Heart Rate: 86 Temp: (!) 97.2 F (36.2 C) Resp: 16 BP: (!) 156/66 SpO2: 99 %  BMI: Estimated body mass index is 38.44 kg/m as calculated from the following:   Height as of this encounter: 5\' 3"  (1.6 m).   Weight  as of this encounter: 217 lb (98.4 kg).  Risk Assessment: Allergies: Reviewed. She is allergic to adhesive [tape], other, latex, and morphine and related.  Allergy Precautions: None required Coagulopathies: Reviewed. None identified.  Blood-thinner therapy: None  at this time Active Infection(s): Reviewed. None identified. Kara [name] is afebrile  Site Confirmation: Ms. Colquitt was asked to confirm the procedure and laterality before marking the site Procedure checklist: Completed Consent: Before the procedure and under the influence of no sedative(s), amnesic(s), or anxiolytics, the patient was informed of the treatment options, risks and possible complications. To fulfill our ethical and legal obligations, as recommended by the American Medical Association's Code of Ethics, I have informed the patient of my clinical impression; the nature and purpose of the treatment or procedure; the risks, benefits, and possible complications of the intervention; the alternatives, including doing nothing; the risk(s) and benefit(s) of the alternative treatment(s) or procedure(s); and the risk(s) and benefit(s) of doing nothing. The patient was provided information about the general risks and possible complications associated with the procedure. These may include, but are not limited to: failure to achieve desired goals, infection, bleeding, organ or nerve damage, allergic reactions, paralysis, and death. In addition, the patient was informed of those risks and complications associated to Spine-related procedures, such as failure to decrease pain; infection (i.e.: Meningitis, epidural or intraspinal abscess); bleeding (i.e.: epidural hematoma, subarachnoid hemorrhage, or any other type of intraspinal or peri-dural bleeding); organ or nerve damage (i.e.: Any type of peripheral nerve, nerve root, or spinal cord injury) with subsequent damage to sensory, motor, and/or autonomic systems, resulting in  permanent pain, numbness, and/or weakness of one or several areas of the body; allergic reactions; (i.e.: anaphylactic reaction); and/or death. Furthermore, the patient was informed of those risks and complications associated with the medications. These include, but are not limited to: allergic reactions (i.e.: anaphylactic or anaphylactoid reaction(s)); adrenal axis suppression; blood sugar elevation that in diabetics may result in ketoacidosis or comma; water retention that in patients with history of congestive heart failure may result in shortness of breath, pulmonary edema, and decompensation with resultant heart failure; weight gain; swelling or edema; medication-induced neural toxicity; particulate matter embolism and blood vessel occlusion with resultant organ, and/or nervous system infarction; and/or aseptic necrosis of one or more joints. Finally, the patient was informed that Medicine is not an exact science; therefore, there is also the possibility of unforeseen or unpredictable risks and/or possible complications that may result in a catastrophic outcome. The patient indicated having understood very clearly. We have given the patient no guarantees and we have made no promises. Enough time was given to the patient to ask questions, all of which were answered to the patient's satisfaction. Ms. Steedman has indicated that she wanted to continue with the procedure. Attestation: I, the ordering provider, attest that I have discussed with the patient the benefits, risks, side-effects, alternatives, likelihood of achieving goals, and potential problems during recovery for the procedure that I have provided informed consent. Date  Time: 04/29/2020 12:11 PM  Pre-Procedure Preparation:  Monitoring: As per clinic protocol. Respiration, ETCO2, SpO2, BP, heart rate and rhythm monitor placed and checked for adequate function Safety Precautions: Patient was assessed for positional comfort and pressure  points before starting the procedure. Time-out: I initiated and conducted the "Time-out" before starting the procedure, as per protocol. The patient was asked to participate by confirming the accuracy of the "Time Out" information. Verification of the correct person, site, and procedure were performed and confirmed by me, the nursing staff, and the patient. "Time-out" conducted as per Joint Commission's Universal Protocol (UP.01.01.01). Time: 1254  Description of Procedure:          Laterality: Bilateral. The procedure was performed in  identical fashion on both sides. Levels:  L2, L3, L4, L5, & S1 Medial Branch Level(s) Area Prepped: Posterior Lumbosacral Region DuraPrep (Iodine Povacrylex [0.7% available iodine] and Isopropyl Alcohol, 74% w/w) Safety Precautions: Aspiration looking for blood return was conducted prior to all injections. At no point did we inject any substances, as a needle was being advanced. Before injecting, the patient was told to immediately notify me if she was experiencing any new onset of "ringing in the ears, or metallic taste in the mouth". No attempts were made at seeking any paresthesias. Safe injection practices and needle disposal techniques used. Medications properly checked for expiration dates. SDV (single dose vial) medications used. After the completion of the procedure, all disposable equipment used was discarded in the proper designated medical waste containers. Local Anesthesia: Protocol guidelines were followed. The patient was positioned over the fluoroscopy table. The area was prepped in the usual manner. The time-out was completed. The target area was identified using fluoroscopy. A 12-in long, straight, sterile hemostat was used with fluoroscopic guidance to locate the targets for each level blocked. Once located, the skin was marked with an approved surgical skin marker. Once all sites were marked, the skin (epidermis, dermis, and hypodermis), as well as deeper  tissues (fat, connective tissue and muscle) were infiltrated with a small amount of a short-acting local anesthetic, loaded on a 10cc syringe with a 25G, 1.5-in  Needle. An appropriate amount of time was allowed for local anesthetics to take effect before proceeding to the next step. Local Anesthetic: Lidocaine 2.0% The unused portion of the local anesthetic was discarded in the proper designated containers. Technical explanation of process:   L3 Medial Branch Nerve Block (MBB): The target area for the L3 medial branch is at the junction of the postero-lateral aspect of the superior articular process and the superior, posterior, and medial edge of the transverse process of L4. Under fluoroscopic guidance, a Quincke needle was inserted until contact was made with os over the superior postero-lateral aspect of the pedicular shadow (target area). After negative aspiration for blood, 0.5 mL of the nerve block solution was injected without difficulty or complication. The needle was removed intact. L4 Medial Branch Nerve Block (MBB): The target area for the L4 medial branch is at the junction of the postero-lateral aspect of the superior articular process and the superior, posterior, and medial edge of the transverse process of L5. Under fluoroscopic guidance, a Quincke needle was inserted until contact was made with os over the superior postero-lateral aspect of the pedicular shadow (target area). After negative aspiration for blood, 0.5 mL of the nerve block solution was injected without difficulty or complication. The needle was removed intact. L5 Medial Branch Nerve Block (MBB): The target area for the L5 medial branch is at the junction of the postero-lateral aspect of the superior articular process and the superior, posterior, and medial edge of the sacral ala. Under fluoroscopic guidance, a Quincke needle was inserted until contact was made with os over the superior postero-lateral aspect of the pedicular  shadow (target area). After negative aspiration for blood, 0.5 mL of the nerve block solution was injected without difficulty or complication. The needle was removed intact. S1 Medial Branch Nerve Block (MBB): The target area for the S1 medial branch is at the posterior and inferior 6 o'clock position of the L5-S1 facet joint. Under fluoroscopic guidance, the Quincke needle inserted for the L5 MBB was redirected until contact was made with os over the inferior and postero  aspect of the sacrum, at the 6 o' clock position under the L5-S1 facet joint (Target area). After negative aspiration for blood, 0.5 mL of the nerve block solution was injected without difficulty or complication. The needle was removed intact.  Nerve block solution: 0.2% PF-Ropivacaine + Triamcinolone (40 mg/mL) diluted to a final concentration of 4 mg of Triamcinolone/mL of Ropivacaine The unused portion of the solution was discarded in the proper designated containers. Procedural Needles: 22-gauge, 5-inch, Quincke needles used for all levels.  Once the entire procedure was completed, the treated area was cleaned, making sure to leave some of the prepping solution back to take advantage of its long term bactericidal properties.   Illustration of the posterior view of the lumbar spine and the posterior neural structures. Laminae of L2 through S1 are labeled. DPRL5, dorsal primary ramus of L5; DPRS1, dorsal primary ramus of S1; DPR3, dorsal primary ramus of L3; FJ, facet (zygapophyseal) joint L3-L4; I, inferior articular process of L4; LB1, lateral branch of dorsal primary ramus of L1; IAB, inferior articular branches from L3 medial branch (supplies L4-L5 facet joint); IBP, intermediate branch plexus; MB3, medial branch of dorsal primary ramus of L3; NR3, third lumbar nerve root; S, superior articular process of L5; SAB, superior articular branches from L4 (supplies L4-5 facet joint also); TP3, transverse process of L3.  Vitals:    04/29/20 1308 04/29/20 1317 04/29/20 1326 04/29/20 1337  BP: (!) 143/89 (!) 156/66 131/75 (!) 150/77  Pulse:      Resp: 18 16 16    Temp:    98 F (36.7 C)  SpO2: 100% 100% 99% 98%  Weight:      Height:         Start Time: 1254 hrs. End Time: 1308 hrs.  Imaging Guidance (Spinal):          Type of Imaging Technique: Fluoroscopy Guidance (Spinal) Indication(s): Assistance in needle guidance and placement for procedures requiring needle placement in or near specific anatomical locations not easily accessible without such assistance. Exposure Time: Please see nurses notes. Contrast: None used. Fluoroscopic Guidance: I was personally present during the use of fluoroscopy. "Tunnel Vision Technique" used to obtain the best possible view of the target area. Parallax error corrected before commencing the procedure. "Direction-depth-direction" technique used to introduce the needle under continuous pulsed fluoroscopy. Once target was reached, antero-posterior, oblique, and lateral fluoroscopic projection used confirm needle placement in all planes. Images permanently stored in EMR. Interpretation: No contrast injected. I personally interpreted the imaging intraoperatively. Adequate needle placement confirmed in multiple planes. Permanent images saved into the patient's record.  Antibiotic Prophylaxis:   Anti-infectives (From admission, onward)   None     Indication(s): None identified  Post-operative Assessment:  Post-procedure Vital Signs:  Pulse/HCG Rate: 8787 Temp: 98 F (36.7 C) Resp: 16 BP: (!) 150/77 SpO2: 98 %  EBL: None  Complications: No immediate post-treatment complications observed by team, or reported by patient.  Note: The patient tolerated the entire procedure well. A repeat set of vitals were taken after the procedure and the patient was kept under observation following institutional policy, for this type of procedure. Post-procedural neurological assessment was  performed, showing return to baseline, prior to discharge. The patient was provided with post-procedure discharge instructions, including a section on how to identify potential problems. Should any problems arise concerning this procedure, the patient was given instructions to immediately contact us, at any time, without hesitation. In any case, we plan to contact the patient by telephone for  a follow-up status report regarding this interventional procedure.  Comments:  No additional relevant information.  Plan of Care  Orders:  Orders Placed This Encounter  Procedures  . LUMBAR FACET(MEDIAL BRANCH NERVE BLOCK) MBNB    Scheduling Instructions:     Procedure: Lumbar facet block (AKA.: Lumbosacral medial branch nerve block)     Side: Bilateral     Level: L3-4, L4-5, & L5-S1 Facets (L2, L3, L4, L5, & S1 Medial Branch Nerves)     Sedation: Patient's choice.     Timeframe: Today    Order Specific Question:   Where will this procedure be performed?    Answer:   [name]  . [facility] C-ARM 1-60 MIN NO REPORT    Intraoperative interpretation by procedural physician at [facility].    Standing Status:   Standing    Number of Occurrences:   1    Order Specific Question:   Reason for exam:    Answer:   Assistance in needle guidance and placement for procedures requiring needle placement in or near specific anatomical locations not easily accessible without such assistance.  . Informed Consent Details: Physician/Practitioner Attestation; Transcribe to consent form and obtain patient signature    Nursing Order: Transcribe to consent form and obtain patient signature. Note: Always confirm laterality of pain with Ms. Bake, before procedure.    Order Specific Question:   Physician/Practitioner attestation of informed consent for procedure/surgical case    Answer:   I, the physician/practitioner, attest that I have discussed with the patient the benefits, risks, side  effects, alternatives, likelihood of achieving goals and potential problems during recovery for the procedure that I have provided informed consent.    Order Specific Question:   Procedure    Answer:   Lumbar Facet Block  under fluoroscopic guidance    Order Specific Question:   Physician/Practitioner performing the procedure    Answer:   Ewel Lona A. Dossie Arbour MD    Order Specific Question:   Indication/Reason    Answer:   Low Back Pain, with our without leg pain, due to Facet Joint Arthralgia (Joint Pain) Spondylosis (Arthritis of the Spine), without myelopathy or radiculopathy (Nerve Damage).  . Care order/instruction: Please confirm that the patient has stopped the Coumadin (Warfarin) X 5 days prior to procedure or surgery.    Please confirm that the patient has stopped the Coumadin (Warfarin) X 5 days prior to procedure or surgery.    Standing Status:   Standing    Number of Occurrences:   1  . Provide equipment / supplies at bedside    "Block Tray" (Disposable  single use) Needle type: SpinalSpinal Amount/quantity: 4 Size: Medium (5-inch) Gauge: 22G    Standing Status:   Standing    Number of Occurrences:   1    Order Specific Question:   Specify    Answer:   Block Tray  . Bleeding precautions    Standing Status:   Standing    Number of Occurrences:   1  . Latex precautions    Activate Latex-Free Protocol.    Standing Status:   Standing    Number of Occurrences:   1   Chronic Opioid Analgesic:  Hydrocodone/APAP 5/325, 1 tab PO QD PRN (5 mg/day of hydrocodone) MME: 5 mg/day.   Medications ordered for procedure: Meds ordered this encounter  Medications  . lidocaine (XYLOCAINE) 2 % (with pres) injection 400 mg  . lactated ringers infusion 1,000 mL  . midazolam (VERSED)  5 MG/5ML injection 1-2 mg    Make sure Flumazenil is available in the pyxis when using this medication. If oversedation occurs, administer 0.2 mg IV over 15 sec. If after 45 sec no response, administer 0.2 mg  again over 1 min; may repeat at 1 min intervals; not to exceed 4 doses (1 mg)  . fentaNYL (SUBLIMAZE) injection 25-50 mcg    Make sure Narcan is available in the pyxis when using this medication. In the event of respiratory depression (RR< 8/min): Titrate NARCAN (naloxone) in increments of 0.1 to 0.2 mg IV at 2-3 minute intervals, until desired degree of reversal.  . ropivacaine (PF) 2 mg/mL (0.2%) (NAROPIN) injection 18 mL  . triamcinolone acetonide (KENALOG-40) injection 80 mg  . ketorolac (TORADOL) injection 60 mg  . orphenadrine (NORFLEX) injection 60 mg   Medications administered: We administered lidocaine, lactated ringers, midazolam, fentaNYL, ropivacaine (PF) 2 mg/mL (0.2%), triamcinolone acetonide, ketorolac, and orphenadrine.  See the medical record for exact dosing, route, and time of administration.  Follow-up plan:   Return in about 2 weeks (around 05/13/2020) for (F2F), (PP) Follow-up.      Interventional Therapies  Risk  Complexity  Complicating Factors:   1.  Coumadin anticoagulation (Stop:5days  Re-start: 2hrs) 2.  Latex allergy  3.  Morbid obesity (class III) (BMI>40)    Planned  Pending:   Pending further evaluation   Under consideration:   Palliative procedures until BMI<35.   Completed:   Palliative/Therapeutic left L4-5 LESI x1 Palliative left lumbar facet blockx5 Palliativeright lumbar facet block(s) x9 Palliative right SI joint block x3(not as effective as facet blocks) Diagnostic left SI joint block x1 (not as effective in controlling the low back pain as the facet injections) Palliative/Therapeutic (Midline) caudalESIx3   Palliative options:   Palliative/Therapeutic left L4-5 LESI #2 Palliative left lumbar facet block#6 Palliativeright lumbar facet block(s) #10 Palliative right SI joint block #4(not as effective as facet blocks) Diagnostic left SI joint block #2 (not as effective in controlling the low back pain as the facet  injections) Palliative/Therapeutic (Midline) caudalESI#4    Recent Visits Date Type Provider Dept  04/23/20 Telemedicine Milinda Pointer, MD [name]  04/14/20 Telemedicine Milinda Pointer, [facility]  04/03/20 Telemedicine Milinda Pointer, MD [name]  03/27/20 Telemedicine Milinda Pointer, MD [name]  03/13/20 Procedure visit Milinda Pointer, MD [name]  03/10/20 Telemedicine Milinda Pointer, MD [name]  02/25/20 Telemedicine Milinda Pointer, MD [name]  Showing recent visits within past 90 days and meeting all other requirements Today's Visits Date Type Provider Dept  04/29/20 Procedure visit Milinda Pointer, MD [name]  Showing today's visits and meeting all other requirements Future Appointments Date Type Provider Dept  05/13/20 Appointment Milinda Pointer, MD [name]  Showing future appointments within next 90 days and meeting all other requirements  Disposition: Discharge home  Discharge (Date  Time): 04/29/2020; 1354 hrs.   Primary Care Physician: Tracie Harrier, MD Location: [facility] Note by: Gaspar Cola, MD Date: 04/29/2020; Time: 2:53 PM  Disclaimer:  Medicine is not an Chief Strategy Officer. The only guarantee in medicine is that nothing is guaranteed. It is important to note that the decision to proceed with this intervention was based on the information collected from the patient. The Data and conclusions were drawn from the patient's questionnaire, the interview, and the physical examination. Because the information was provided in large part by the patient, it  cannot be guaranteed that it has not been purposely or unconsciously manipulated. Every effort has been made to obtain as much relevant data as possible for this evaluation. It is important to note that the conclusions that lead  to this procedure are derived in large part from the available data. Always take into account that the treatment will also be dependent on availability of resources and existing treatment guidelines, considered by other Pain Management Practitioners as being common knowledge and practice, at the time of the intervention. For Medico-Legal purposes, it is also important to point out that variation in procedural techniques and pharmacological choices are the acceptable norm. The indications, contraindications, technique, and results of the above procedure should only be interpreted and judged by a Board-Certified Interventional Pain Specialist with extensive familiarity and expertise in the same exact procedure and technique.

## 2020-04-29 NOTE — Progress Notes (Signed)
Safety precautions to be maintained throughout the outpatient stay will include: orient to surroundings, keep bed in low position, maintain call bell within reach at all times, provide assistance with transfer out of bed and ambulation.  

## 2020-04-30 ENCOUNTER — Telehealth: Payer: Self-pay | Admitting: *Deleted

## 2020-04-30 NOTE — Telephone Encounter (Signed)
Attempted to call for post procedure follow-up. Message left. 

## 2020-05-05 ENCOUNTER — Telehealth: Payer: Self-pay

## 2020-05-05 NOTE — Telephone Encounter (Signed)
Dr. Dossie Arbour instructs patient to go ahead and begin dose of 2 tabs tid, then report back if tolerated. If so, then schedule VV tomorrow. Patient states she has already been taking 2 tabs tid prn. Agrees to VV tomorrow.

## 2020-05-05 NOTE — Telephone Encounter (Signed)
Message sent to Dr Naveira 

## 2020-05-05 NOTE — Telephone Encounter (Signed)
When she called the pharmacy to refill her medicine, they didn't have the updated instructions. Dr. Dossie Arbour told her to increase to 2 tab tid but they still have the every 8 hours prn. Please call the pharmacy and correct the instructions.

## 2020-05-06 ENCOUNTER — Other Ambulatory Visit: Payer: Self-pay

## 2020-05-06 ENCOUNTER — Encounter: Payer: Self-pay | Admitting: Pain Medicine

## 2020-05-06 ENCOUNTER — Ambulatory Visit: Payer: PPO | Attending: Pain Medicine | Admitting: Pain Medicine

## 2020-05-06 DIAGNOSIS — E1165 Type 2 diabetes mellitus with hyperglycemia: Secondary | ICD-10-CM | POA: Diagnosis not present

## 2020-05-06 DIAGNOSIS — M5136 Other intervertebral disc degeneration, lumbar region: Secondary | ICD-10-CM | POA: Diagnosis not present

## 2020-05-06 DIAGNOSIS — Z86711 Personal history of pulmonary embolism: Secondary | ICD-10-CM | POA: Diagnosis not present

## 2020-05-06 DIAGNOSIS — I700 Atherosclerosis of aorta: Secondary | ICD-10-CM | POA: Diagnosis not present

## 2020-05-06 DIAGNOSIS — Z5329 Procedure and treatment not carried out because of patient's decision for other reasons: Secondary | ICD-10-CM

## 2020-05-06 DIAGNOSIS — Z7901 Long term (current) use of anticoagulants: Secondary | ICD-10-CM | POA: Diagnosis not present

## 2020-05-06 DIAGNOSIS — I10 Essential (primary) hypertension: Secondary | ICD-10-CM | POA: Diagnosis not present

## 2020-05-06 DIAGNOSIS — Z6838 Body mass index (BMI) 38.0-38.9, adult: Secondary | ICD-10-CM | POA: Diagnosis not present

## 2020-05-06 DIAGNOSIS — E1142 Type 2 diabetes mellitus with diabetic polyneuropathy: Secondary | ICD-10-CM | POA: Diagnosis not present

## 2020-05-06 NOTE — Progress Notes (Deleted)
Patient: Kara [name]  Service Category: E/M  Provider: Gaspar Cola, MD  DOB: 1950-12-08  DOS: 05/06/2020  Location: Office  MRN: [id number]  Setting: Ambulatory outpatient  Referring Provider: Tracie Harrier, MD  Type: Established Patient  Specialty: Interventional Pain Management  PCP: Tracie Harrier, MD  Location: Remote location  Delivery: TeleHealth     Virtual Encounter - Pain Management PROVIDER NOTE: Information contained herein reflects review and annotations entered in association with encounter. Interpretation of such information and data should be left to medically-trained personnel. Information provided to patient can be located elsewhere in the medical record under "Patient Instructions". Document created using STT-dictation technology, any transcriptional errors that may result from process are unintentional.    Contact & Pharmacy Preferred: [phone] Home: [phone] (home) Mobile: [phone] (mobile) E-mail: [email or web address]  [place], [place]. [place] [address] Phone: [phone] Fax: [phone]   Pre-screening  Kara [name] offered "in-person" vs "virtual" encounter. She indicated preferring virtual for this encounter.   Reason COVID-19*   Social distancing based on CDC and AMA recommendations.   I contacted Kara [name] on 05/06/2020 via telephone.      I clearly identified myself as Gaspar Cola, MD. I verified that I was speaking with the correct person using two identifiers (Name: Kara [name], and date of birth: 12/25/1950).  Consent I sought verbal advanced consent from Kara [name] for virtual visit interactions. I informed Kara [name] of possible security and privacy concerns, risks, and limitations associated with providing "not-in-person" medical evaluation and management services. I also informed Kara [name] of the  availability of "in-person" appointments. Finally, I informed her that there would be a charge for the virtual visit and that she could be  personally, fully or partially, financially responsible for it. Kara [name] expressed understanding and agreed to proceed.   Historic Elements   Kara [name] is a 69 y.o. year old, female patient evaluated today after our last contact on 04/29/2020. 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 She also  has a past surgical history that includes Knee surgery; Carpal tunnel release (Bilateral); Colon resection sigmoid (N/A, 11/02/2016); Colostomy (N/A, 11/02/2016); Sigmoidoscopy (N/A, 11/13/2016); PULMONARY VENOGRAPHY (N/A, 11/19/2016); Colonoscopy with propofol (N/A, 02/03/2017); Colon surgery (11/02/2016); IVC FILTER INSERTION (Right, 10/2016); Colostomy reversal (N/A, 02/24/2017); Appendectomy (N/A, 02/24/2017); Lysis of adhesion (N/A, 02/24/2017); laparoscopy (N/A, 02/24/2017); Ileo loop diversion (N/A, 02/24/2017); Sigmoidoscopy (N/A, 05/19/2017); Ileostomy closure (N/A, 06/01/2017); IVC FILTER REMOVAL (N/A, 08/09/2017); Sacroplasty (N/A, 11/08/2017); Cataract extraction w/PHACO (Left, 11/21/2018); and Cataract extraction w/PHACO (Right, 12/12/2018). Kara [name] has a current medication list which includes the following prescription(s): aspirin ec, calcium carbonate, calcium polycarbophil, candesartan, vitamin d3, cyclobenzaprine, dulaglutide, gabapentin, gabapentin, glipizide, glipizide, meclizine, multi-vitamins, nystatin cream, ropinirole, tolterodine, tramadol, vitamin b-12, warfarin, warfarin, and hydrocodone-acetaminophen. She  reports that she has never smoked. She has  never used smokeless tobacco. She reports that she does not drink alcohol and does not use drugs. Kara [name] is allergic to adhesive [tape], other, latex, and morphine and related.   HPI  Today, she is being contacted for a post-procedure assessment.  Post-Procedure Evaluation  Procedure (04/29/2020): Palliative bilateral lumbar facet block under fluoroscopic guidance and IV sedation Pre-procedure pain level: 10/10 Post-procedure: 0/10 (100%  relief)  Sedation: Sedation provided.  Effectiveness during initial hour after procedure(Ultra-Short Term Relief): 100 %.  Local anesthetic used: Long-acting (4-6 hours) Effectiveness: Defined as any analgesic benefit obtained secondary to the administration of local anesthetics. This carries significant diagnostic value as to the etiological location, or anatomical origin, of the pain. Duration of benefit is expected to coincide with the duration of the local anesthetic used.  Effectiveness during initial 4-6 hours after procedure(Short-Term Relief): 100 %.  Long-term benefit: Defined as any relief past the pharmacologic duration of the local anesthetics.  Effectiveness past the initial 6 hours after procedure(Long-Term Relief): 85 %.  Current benefits: Defined as benefit that persist at this time.   Analgesia:   ***  Function:  ***  ROM:  ***   Pharmacotherapy Assessment  Analgesic: Hydrocodone/APAP 5/325, 1 tab PO QD PRN (5 mg/day of hydrocodone) MME: 5 mg/day.   Monitoring: [place] PMP: PDMP reviewed during this encounter.       Pharmacotherapy: No side-effects or adverse reactions reported. Compliance: No problems identified. Effectiveness: Clinically acceptable. Plan: Refer to "POC".  UDS:  Summary  Date Value Ref Range Status  09/20/2019 Note  Final    Comment:    ==================================================================== [employer] Select 13  (MW) ==================================================================== Test                             Result       Flag       Units Drug Absent but Declared for Prescription Verification   Hydrocodone                    Not Detected UNEXPECTED ng/mg creat ==================================================================== Test                      Result    Flag   Units      Ref Range   Creatinine              126              mg/dL      >=20 ==================================================================== Declared Medications:  The flagging and interpretation on this report are based on the  following declared medications.  Unexpected results may arise from  inaccuracies in the declared medications.  **Note: The testing scope of this panel includes these medications:  Hydrocodone  **Note: The testing scope of this panel does not include the  following reported medications:  Acetaminophen  Aspirin  Calcium  Candesartan  Cholecalciferol  Cyanocobalamin  Cyclobenzaprine  Dulaglutide  Gabapentin  Glipizide  Loperamide  Meclizine  Multivitamin  Nystatin  Ropinirole  Warfarin ==================================================================== For clinical consultation, please call [phone]. ====================================================================     Laboratory Chemistry Profile   Renal Lab Results  Component Value Date   BUN 10 07/09/2019   CREATININE 0.63 07/09/2019   GFRAA >60 07/09/2019   GFRNONAA >60 07/09/2019     Hepatic Lab Results  Component Value Date   AST 38 07/09/2019   ALT 43 07/09/2019   ALBUMIN 3.6 07/09/2019   ALKPHOS 51 07/09/2019   LIPASE 23 07/09/2019     Electrolytes Lab Results  Component Value Date   NA 139 07/09/2019   K 3.7 07/09/2019   CL 110 07/09/2019   CALCIUM 8.5 (L) 07/09/2019   MG 2.0 11/02/2017   PHOS 3.3 11/07/2016     Bone Lab Results  Component Value Date   25OHVITD1 25 (L) 11/02/2017  25OHVITD2 <1.0 11/02/2017   25OHVITD3 25 11/02/2017     Inflammation (CRP: Acute Phase) (ESR: Chronic Phase) Lab Results  Component Value Date   CRP 5.3 (H) 11/02/2017   ESRSEDRATE 12 11/02/2017       Note: Above Lab results reviewed.  Imaging  DG PAIN CLINIC C-ARM 1-60 MIN NO REPORT Fluoro was used, but no Radiologist interpretation will be provided.  Please refer to "NOTES" tab for provider progress note.  Assessment  The primary encounter diagnosis was Chronic pain syndrome. Diagnoses of Lumbar facet syndrome (Bilateral), Chronic low back pain (1ry area of Pain) (Bilateral) w/o sciatica, Grade 1-2 Anterolisthesis of L4/L5 & L5/S1, and Morbid obesity with BMI of 40.0-44.9, adult (HCC) were also pertinent to this visit.  Plan of Care  Problem-specific:  No problem-specific Assessment & Plan notes found for this encounter.  Kara [name] has a current medication list which includes the following long-term medication(s): calcium carbonate, candesartan, glipizide, glipizide, ropinirole, tramadol, warfarin, warfarin, and hydrocodone-acetaminophen.  Pharmacotherapy (Medications Ordered): No orders of the defined types were placed in this encounter.  Orders:  No orders of the defined types were placed in this encounter.  Follow-up plan:   No follow-ups on file.      Interventional Therapies  Risk   Complexity   Complicating Factors:   1.  Coumadin anticoagulation (Stop:5days   Re-start: 2hrs) 2.  Latex allergy  3.  Morbid obesity (class III) (BMI>40)    Planned   Pending:   Pending further evaluation   Under consideration:   Palliative procedures until BMI<35.   Completed:   Palliative/Therapeutic left L4-5 LESI x1 Palliative left lumbar facet blockx5 Palliativeright lumbar facet block(s) x9 Palliative right SI joint block x3(not as effective as facet blocks) Diagnostic left SI joint block x1 (not as effective in controlling the low back pain  as the facet injections) Palliative/Therapeutic (Midline) caudalESIx3   Palliative options:   Palliative/Therapeutic left L4-5 LESI #2 Palliative left lumbar facet block#6 Palliativeright lumbar facet block(s) #10 Palliative right SI joint block #4(not as effective as facet blocks) Diagnostic left SI joint block #2 (not as effective in controlling the low back pain as the facet injections) Palliative/Therapeutic (Midline) caudalESI#4     Recent Visits Date Type Provider Dept  04/29/20 Procedure visit Milinda Pointer, MD [name]  04/23/20 Telemedicine Milinda Pointer, [facility]  04/14/20 Telemedicine Milinda Pointer, MD [name]  04/03/20 Telemedicine Milinda Pointer, MD [name]  03/27/20 Telemedicine Milinda Pointer, MD [name]  03/13/20 Procedure visit Milinda Pointer, MD [name]  03/10/20 Telemedicine Milinda Pointer, MD [name]  02/25/20 Telemedicine Milinda Pointer, MD [name]  Showing recent visits within past 90 days and meeting all other requirements Today's Visits Date Type Provider Dept  05/06/20 Telemedicine Milinda Pointer, MD [name]  Showing today's visits and meeting all other requirements Future Appointments Date Type Provider Dept  07/23/20 Appointment Milinda Pointer, MD [name]  Showing future appointments within next 90 days and meeting all other requirements  I discussed the assessment and treatment plan with the patient. The patient was provided an opportunity to ask questions and all were answered. The patient agreed with the plan and demonstrated an understanding of the instructions.  Patient advised to call back or seek an in-person evaluation if the symptoms or condition worsens.  Duration of encounter: *** minutes.  Note by: Gaspar Cola, MD Date: 05/06/2020; Time:  2:58  PM

## 2020-05-06 NOTE — Progress Notes (Addendum)
Unsuccessful attempt to contact patient for Virtual Visit (Pain Management Telehealth)   Patient provided contact information:  [phone] (home); [phone] (mobile); (Preferred) [phone] [email or web address]   Pre-screening:  Our staff was successful in contacting Ms. Cronin using the above provided information.   I unsuccessfully attempted to make contact with [name] on 2 different occasions on 05/06/2020 via telephone. I was unable to complete the virtual encounter due to call going directly to voicemail. I was able to leave a message.  Our staff was able to collect the following information on the precharting screening call.  Post-Procedure Evaluation  Procedure (04/29/2020): Palliative bilateral lumbar facet block under fluoroscopic guidance and IV sedation Pre-procedure pain level: 10/10 Post-procedure: 0/10 (100% relief)  Sedation: Sedation provided.  Effectiveness during initial hour after procedure(Ultra-Short Term Relief): 100 %.  Local anesthetic used: Long-acting (4-6 hours) Effectiveness: Defined as any analgesic benefit obtained secondary to the administration of local anesthetics. This carries significant diagnostic value as to the etiological location, or anatomical origin, of the pain. Duration of benefit is expected to coincide with the duration of the local anesthetic used.  Effectiveness during initial 4-6 hours after procedure(Short-Term Relief): 100 %.  Long-term benefit: Defined as any relief past the pharmacologic duration of the local anesthetics.  Effectiveness past the initial 6 hours after procedure(Long-Term Relief): 85 %.  Pharmacotherapy Assessment  Analgesic: Hydrocodone/APAP 5/325, 1 tab PO QD PRN (5 mg/day of hydrocodone) MME: 5 mg/day.   Follow-up plan:   Reschedule Visit.    Interventional Therapies  Risk  Complexity  Complicating Factors:   1.  Coumadin anticoagulation (Stop:5days  Re-start: 2hrs) 2.  Latex  allergy  3.  Morbid obesity (class III) (BMI>40)  4.  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   Completed:   Palliative/Therapeutic left L4-5 LESI x1(11/10/2017) Palliative left lumbar facet blockx13(04/29/2020) Palliativeright lumbar facet block(s) x17(04/29/2020) Palliative right SI joint block x8(not as effective as facet Blk) (03/08/2019) Diagnostic left SI joint block x1 (not as effective as facet Blk) (03/08/2019) Palliative/Therapeutic (Midline) caudalESIx3(06/08/2018)   Palliative options:   Palliative/Therapeutic left L4-5 LESI #2 Palliative left lumbar facet block#14 Palliativeright lumbar facet block(s) #18 Palliative right SI joint block #9 Diagnostic left SI joint block #2  Palliative/Therapeutic (Midline) caudalESI#4    Recent Visits Date Type Provider Dept  04/29/20 Procedure visit Milinda Pointer, MD [name]  04/23/20 Telemedicine Milinda Pointer, MD [name]  04/14/20 Telemedicine Milinda Pointer, MD [name]  04/03/20 Telemedicine Milinda Pointer, MD [name]  03/27/20 Telemedicine Milinda Pointer, MD [name]  03/13/20 Procedure visit Milinda Pointer, MD [name]  03/10/20 Telemedicine Milinda Pointer, MD [name]  02/25/20 Telemedicine Milinda Pointer, MD [name]  Showing recent visits within past 90 days and meeting all other requirements Today's Visits Date Type Provider Dept  05/06/20 Telemedicine Milinda Pointer, MD [name]  Showing today's visits and meeting all other requirements Future Appointments Date Type Provider Dept  07/23/20 Appointment Milinda Pointer, MD [name]  Showing future appointments within next 90 days and meeting all other requirements   Note  by: Gaspar Cola, MD Date: 05/06/2020; Time: 4:14 PM

## 2020-05-13 ENCOUNTER — Ambulatory Visit: Payer: PPO | Admitting: Pain Medicine

## 2020-05-20 DIAGNOSIS — R791 Abnormal coagulation profile: Secondary | ICD-10-CM | POA: Diagnosis not present

## 2020-05-22 DIAGNOSIS — Z7901 Long term (current) use of anticoagulants: Secondary | ICD-10-CM | POA: Diagnosis not present

## 2020-05-22 DIAGNOSIS — E1165 Type 2 diabetes mellitus with hyperglycemia: Secondary | ICD-10-CM | POA: Diagnosis not present

## 2020-05-22 DIAGNOSIS — I700 Atherosclerosis of aorta: Secondary | ICD-10-CM | POA: Diagnosis not present

## 2020-05-22 DIAGNOSIS — I10 Essential (primary) hypertension: Secondary | ICD-10-CM | POA: Diagnosis not present

## 2020-05-22 DIAGNOSIS — E1142 Type 2 diabetes mellitus with diabetic polyneuropathy: Secondary | ICD-10-CM | POA: Diagnosis not present

## 2020-05-22 DIAGNOSIS — M5136 Other intervertebral disc degeneration, lumbar region: Secondary | ICD-10-CM | POA: Diagnosis not present

## 2020-05-24 DIAGNOSIS — C3490 Malignant neoplasm of unspecified part of unspecified bronchus or lung: Secondary | ICD-10-CM

## 2020-05-24 HISTORY — DX: Malignant neoplasm of unspecified part of unspecified bronchus or lung: C34.90

## 2020-05-29 DIAGNOSIS — Z20822 Contact with and (suspected) exposure to covid-19: Secondary | ICD-10-CM | POA: Diagnosis not present

## 2020-06-03 DIAGNOSIS — R0989 Other specified symptoms and signs involving the circulatory and respiratory systems: Secondary | ICD-10-CM | POA: Diagnosis not present

## 2020-06-03 DIAGNOSIS — Z20828 Contact with and (suspected) exposure to other viral communicable diseases: Secondary | ICD-10-CM | POA: Diagnosis not present

## 2020-06-05 DIAGNOSIS — R791 Abnormal coagulation profile: Secondary | ICD-10-CM | POA: Diagnosis not present

## 2020-06-05 DIAGNOSIS — R319 Hematuria, unspecified: Secondary | ICD-10-CM | POA: Diagnosis not present

## 2020-06-06 DIAGNOSIS — I700 Atherosclerosis of aorta: Secondary | ICD-10-CM | POA: Diagnosis not present

## 2020-06-06 DIAGNOSIS — E1142 Type 2 diabetes mellitus with diabetic polyneuropathy: Secondary | ICD-10-CM | POA: Diagnosis not present

## 2020-06-06 DIAGNOSIS — Z7901 Long term (current) use of anticoagulants: Secondary | ICD-10-CM | POA: Diagnosis not present

## 2020-06-06 DIAGNOSIS — M5136 Other intervertebral disc degeneration, lumbar region: Secondary | ICD-10-CM | POA: Diagnosis not present

## 2020-06-06 DIAGNOSIS — E1165 Type 2 diabetes mellitus with hyperglycemia: Secondary | ICD-10-CM | POA: Diagnosis not present

## 2020-06-06 DIAGNOSIS — I10 Essential (primary) hypertension: Secondary | ICD-10-CM | POA: Diagnosis not present

## 2020-06-11 DIAGNOSIS — R791 Abnormal coagulation profile: Secondary | ICD-10-CM | POA: Diagnosis not present

## 2020-06-13 ENCOUNTER — Telehealth: Payer: Self-pay | Admitting: Pain Medicine

## 2020-06-13 NOTE — Telephone Encounter (Addendum)
Please disregard / patient is scheduled on monday

## 2020-06-15 DIAGNOSIS — F1120 Opioid dependence, uncomplicated: Secondary | ICD-10-CM | POA: Insufficient documentation

## 2020-06-15 NOTE — Progress Notes (Signed)
Unsuccessful attempt to contact patient for Virtual Visit (Pain Management Telehealth)   Patient provided contact information:  [phone] (home); [phone] (mobile); (Preferred) [phone] [email or web address]   Pre-screening:  Our staff was successful in contacting Kara [name] using the above provided information.   I unsuccessfully attempted to make contact with Kara [name] on 06/16/2020 via telephone. I was unable to complete the virtual encounter due to call going directly to voicemail. I was able to leave a message.  RTCB: 08/01/2020  Nonopioids transferred 03/27/2020: Calcium  Pharmacotherapy Assessment  Analgesic: Hydrocodone/APAP 5/325, 1 tab PO QD PRN (5 mg/day of hydrocodone) MME: 5 mg/day.   Follow-up plan:   Reschedule Visit.    Interventional Therapies  Risk  Complexity  Complicating Factors:   1.  Coumadin anticoagulation (Stop:5days  Re-start: 2hrs) 2.  Latex allergy  3.  Morbid obesity (class III) (BMI>40)  4.  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   Completed:   Palliative/Therapeutic left L4-5 LESI x1(11/10/2017) Palliative left lumbar facet blockx13(04/29/2020) Palliativeright lumbar facet block(s) x17(04/29/2020) Palliative right SI joint block x8(not as effective as facet Blk) (03/08/2019) Diagnostic left SI joint block x1 (not as effective as facet Blk) (03/08/2019) Palliative/Therapeutic (Midline) caudalESIx3(06/08/2018)   Palliative options:   Palliative/Therapeutic left L4-5 LESI #2 Palliative left lumbar facet block#14 Palliativeright lumbar facet block(s) #18 Palliative right SI joint block #9 Diagnostic left SI joint block #2  Palliative/Therapeutic (Midline) caudalESI#4    Recent Visits Date Type Provider Dept  04/29/20 Procedure visit Milinda Pointer, MD [name]   04/23/20 Telemedicine Milinda Pointer, MD [name]  04/14/20 Telemedicine Milinda Pointer, MD [name]  04/03/20 Telemedicine Milinda Pointer, MD [name]  03/27/20 Telemedicine Milinda Pointer, MD [name]  Showing recent visits within past 90 days and meeting all other requirements Today's Visits Date Type Provider Dept  06/16/20 Telemedicine Milinda Pointer, MD [name]  Showing today's visits and meeting all other requirements Future Appointments Date Type Provider Dept  07/23/20 Appointment Milinda Pointer, MD [name]  Showing future appointments within next 90 days and meeting all other requirements   Note by: Gaspar Cola, MD Date: 06/16/2020; Time: 4:04 PM

## 2020-06-16 ENCOUNTER — Ambulatory Visit: Payer: PPO | Attending: Pain Medicine | Admitting: Pain Medicine

## 2020-06-16 ENCOUNTER — Other Ambulatory Visit: Payer: Self-pay

## 2020-06-16 ENCOUNTER — Encounter: Payer: Self-pay | Admitting: Pain Medicine

## 2020-06-16 DIAGNOSIS — Z79899 Other long term (current) drug therapy: Secondary | ICD-10-CM

## 2020-06-16 DIAGNOSIS — G894 Chronic pain syndrome: Secondary | ICD-10-CM

## 2020-06-16 DIAGNOSIS — F1120 Opioid dependence, uncomplicated: Secondary | ICD-10-CM

## 2020-06-16 DIAGNOSIS — M47816 Spondylosis without myelopathy or radiculopathy, lumbar region: Secondary | ICD-10-CM

## 2020-06-16 DIAGNOSIS — M4310 Spondylolisthesis, site unspecified: Secondary | ICD-10-CM

## 2020-06-16 DIAGNOSIS — G8929 Other chronic pain: Secondary | ICD-10-CM

## 2020-06-16 DIAGNOSIS — M4306 Spondylolysis, lumbar region: Secondary | ICD-10-CM

## 2020-06-17 ENCOUNTER — Encounter: Payer: Self-pay | Admitting: Pain Medicine

## 2020-06-18 ENCOUNTER — Other Ambulatory Visit: Payer: Self-pay

## 2020-06-18 ENCOUNTER — Ambulatory Visit: Payer: PPO | Attending: Pain Medicine | Admitting: Pain Medicine

## 2020-06-18 DIAGNOSIS — M51379 Other intervertebral disc degeneration, lumbosacral region without mention of lumbar back pain or lower extremity pain: Secondary | ICD-10-CM

## 2020-06-18 DIAGNOSIS — M5137 Other intervertebral disc degeneration, lumbosacral region: Secondary | ICD-10-CM

## 2020-06-18 DIAGNOSIS — Z7901 Long term (current) use of anticoagulants: Secondary | ICD-10-CM

## 2020-06-18 DIAGNOSIS — M47816 Spondylosis without myelopathy or radiculopathy, lumbar region: Secondary | ICD-10-CM

## 2020-06-18 DIAGNOSIS — G894 Chronic pain syndrome: Secondary | ICD-10-CM | POA: Diagnosis not present

## 2020-06-18 DIAGNOSIS — M4310 Spondylolisthesis, site unspecified: Secondary | ICD-10-CM

## 2020-06-18 DIAGNOSIS — M545 Low back pain, unspecified: Secondary | ICD-10-CM | POA: Diagnosis not present

## 2020-06-18 DIAGNOSIS — G8929 Other chronic pain: Secondary | ICD-10-CM | POA: Diagnosis not present

## 2020-06-18 DIAGNOSIS — M4300 Spondylolysis, site unspecified: Secondary | ICD-10-CM

## 2020-06-18 MED ORDER — HYDROCODONE-ACETAMINOPHEN 5-325 MG PO TABS: 1.0000 | ORAL_TABLET | Freq: Every day | ORAL | 0 refills | Status: DC

## 2020-06-18 NOTE — Patient Instructions (Signed)
____________________________________________________________________________________________  Preparing for Procedure with Sedation  Procedure appointments are limited to planned procedures: . No Prescription Refills. . No disability issues will be discussed. . No medication changes will be discussed.  Instructions: . Oral Intake: Do not eat or drink anything for at least 8 hours prior to your procedure. (Exception: Blood Pressure Medication. See below.) . Transportation: Unless otherwise stated by your physician, you may drive yourself after the procedure. . Blood Pressure Medicine: Do not forget to take your blood pressure medicine with a sip of water the morning of the procedure. If your Diastolic (lower reading)is above 100 mmHg, elective cases will be cancelled/rescheduled. . Blood thinners: These will need to be stopped for procedures. Notify our staff if you are taking any blood thinners. Depending on which one you take, there will be specific instructions on how and when to stop it. . Diabetics on insulin: Notify the staff so that you can be scheduled 1st case in the morning. If your diabetes requires high dose insulin, take only  of your normal insulin dose the morning of the procedure and notify the staff that you have done so. . Preventing infections: Shower with an antibacterial soap the morning of your procedure. . Build-up your immune system: Take 1000 mg of Vitamin C with every meal (3 times a day) the day prior to your procedure. . Antibiotics: Inform the staff if you have a condition or reason that requires you to take antibiotics before dental procedures. . Pregnancy: If you are pregnant, call and cancel the procedure. . Sickness: If you have a cold, fever, or any active infections, call and cancel the procedure. . Arrival: You must be in the facility at least 30 minutes prior to your scheduled procedure. . Children: Do not bring children with you. . Dress appropriately:  Bring dark clothing that you would not mind if they get stained. . Valuables: Do not bring any jewelry or valuables.  Reasons to call and reschedule or cancel your procedure: (Following these recommendations will minimize the risk of a serious complication.) . Surgeries: Avoid having procedures within 2 weeks of any surgery. (Avoid for 2 weeks before or after any surgery). . Flu Shots: Avoid having procedures within 2 weeks of a flu shots or . (Avoid for 2 weeks before or after immunizations). . Barium: Avoid having a procedure within 7-10 days after having had a radiological study involving the use of radiological contrast. (Myelograms, Barium swallow or enema study). . Heart attacks: Avoid any elective procedures or surgeries for the initial 6 months after a "Myocardial Infarction" (Heart Attack). . Blood thinners: It is imperative that you stop these medications before procedures. Let us know if you if you take any blood thinner.  . Infection: Avoid procedures during or within two weeks of an infection (including chest colds or gastrointestinal problems). Symptoms associated with infections include: Localized redness, fever, chills, night sweats or profuse sweating, burning sensation when voiding, cough, congestion, stuffiness, runny nose, sore throat, diarrhea, nausea, vomiting, cold or Flu symptoms, recent or current infections. It is specially important if the infection is over the area that we intend to treat. . Heart and lung problems: Symptoms that may suggest an active cardiopulmonary problem include: cough, chest pain, breathing difficulties or shortness of breath, dizziness, ankle swelling, uncontrolled high or unusually low blood pressure, and/or palpitations. If you are experiencing any of these symptoms, cancel your procedure and contact your primary care physician for an evaluation.  Remember:  Regular Business hours are:    Monday to Thursday 8:00 AM to 4:00 PM  Provider's  Schedule: Orel Cooler, MD:  Procedure days: Tuesday and Thursday 7:30 AM to 4:00 PM  Bilal Lateef, MD:  Procedure days: Monday and Wednesday 7:30 AM to 4:00 PM ____________________________________________________________________________________________    

## 2020-06-18 NOTE — Progress Notes (Signed)
Patient: Kara [name]  Service Category: E/M  Provider: Gaspar Cola, MD  DOB: 22-Dec-1950  DOS: 06/18/2020  Location: Office  MRN: [id number]  Setting: Ambulatory outpatient  Referring Provider: Tracie Harrier, MD  Type: Established Patient  Specialty: Interventional Pain Management  PCP: Tracie Harrier, MD  Location: Remote location  Delivery: TeleHealth     Virtual Encounter - Pain Management PROVIDER NOTE: Information contained herein reflects review and annotations entered in association with encounter. Interpretation of such information and data should be left to medically-trained personnel. Information provided to patient can be located elsewhere in the medical record under "Patient Instructions". Document created using STT-dictation technology, any transcriptional errors that may result from process are unintentional.    Contact & Pharmacy Preferred: [phone] Home: [phone] (home) Mobile: [phone] (mobile) E-mail: [email or web address]  [place], [place] [address] Phone: [phone] Fax: [phone]   Pre-screening  Kara [name] offered "in-person" vs "virtual" encounter. She indicated preferring virtual for this encounter.   Reason COVID-19*  Social distancing based on CDC and AMA recommendations.   I contacted Kara [name] on 06/18/2020 via telephone.      I clearly identified myself as Gaspar Cola, MD. I verified that I was speaking with the correct person using two identifiers (Name: Kara [name], and date of birth: 28-Apr-1951).  Consent I sought verbal advanced consent from Kara [name] for virtual visit interactions. I informed Kara [name] of possible security and privacy concerns, risks, and limitations associated with providing "not-in-person" medical evaluation and management services. I also informed Kara [name] of the  availability of "in-person" appointments. Finally, I informed her that there would be a charge for the virtual visit and that she could be  personally, fully or partially, financially responsible for it. [name] expressed understanding and agreed to proceed.   Historic Elements   Kara [name] is a 70 y.o. year old, female patient evaluated today after our last contact on 06/13/2020. Ms. 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 She also  has a past surgical history that includes Knee surgery; Carpal tunnel release (Bilateral); Colon resection sigmoid (N/A, 11/02/2016); Colostomy (N/A, 11/02/2016); Sigmoidoscopy (N/A, 11/13/2016); PULMONARY VENOGRAPHY (N/A, 11/19/2016); Colonoscopy with propofol (N/A, 02/03/2017); Colon surgery (11/02/2016); IVC FILTER INSERTION (Right, 10/2016); Colostomy reversal (N/A, 02/24/2017); Appendectomy (N/A, 02/24/2017); Lysis of adhesion (N/A, 02/24/2017); laparoscopy (N/A, 02/24/2017); Ileo loop diversion (N/A, 02/24/2017); Sigmoidoscopy (N/A, 05/19/2017); Ileostomy closure (N/A, 06/01/2017); IVC FILTER REMOVAL (N/A, 08/09/2017); Sacroplasty (N/A, 11/08/2017); Cataract extraction w/PHACO (Left, 11/21/2018); and Cataract extraction w/PHACO (Right, 12/12/2018). Kara [name] has a current medication list which includes the following prescription(s): aspirin ec, calcium carbonate, calcium polycarbophil, candesartan, vitamin d3, cyclobenzaprine, dulaglutide, gabapentin, gabapentin, glipizide, glipizide, meclizine, multi-vitamins, nystatin cream, ropinirole, tolterodine, tramadol, vitamin b-12, warfarin, warfarin, and hydrocodone-acetaminophen. She  reports that she has never smoked. She has  never used smokeless tobacco. She reports that she does not drink alcohol and does not use drugs. Kara [name] is allergic to adhesive [tape], other, latex, and morphine and related.   HPI  Today, she is being contacted for worsening of previously known (established) problem.  The patient indicates that she is taking the tramadol 50 mg, 2 tablets p.o. 3 times daily, during the day, but  it does not really help that much the pain at nighttime.  In the past we had given her some hydrocodone, but she was unable to tolerate that during the day since it made her very sleepy.  Today we will take advantage of that by prescribing some hydrocodone that she can take at bedtime and will keep her on the tramadol during the day.  She describes having some problems with her Coumadin where her INR has been all over and they are trying to get it adjusted.  Improve sure that this has to do with her diet since the Coumadin tends to be affected by what you eat.  In any case, I have recommended that she talk to her physician about the possibility of switching her to either Eliquis or Xarelto, both of which you only have to stop for 3 days prior to procedure.  Along those lines, she indicates that her back pain is worsening and she is currently having bilateral low back pain but interestingly she has the pain on the right side during the day and then at night when she gets home is the left side that is hurting.  Most likely the pain on the left side may be secondary to her compensatory gait if she is limping due to the pain on the right.  In any case, she wants to have her injection repeated and therefore will go ahead and add her to the schedule.  She wanted to come in and have the injection done tomorrow, but according to her, she took a 5 mg Coumadin pill yesterday and we need to stay off of it for 5 days before we do those spinal injections.  She understood and accepted.  Pharmacotherapy Assessment  Analgesic:  Hydrocodone/APAP 5/325, 1 tab PO QD PRN (5 mg/day of hydrocodone) MME: 5 mg/day.   Monitoring: [place] PMP: PDMP reviewed during this encounter.       Pharmacotherapy: No side-effects or adverse reactions reported. Compliance: No problems identified. Effectiveness: Clinically acceptable. Plan: Refer to "POC".  UDS:  Summary  Date Value Ref Range Status  09/20/2019 Note  Final    Comment:    ==================================================================== [employer] Select 13 (MW) ==================================================================== Test                             Result       Flag       Units Drug Absent but Declared for Prescription Verification   Hydrocodone                    Not Detected UNEXPECTED ng/mg creat ==================================================================== Test                      Result    Flag   Units      Ref Range   Creatinine              126              mg/dL      >=20 ==================================================================== Declared Medications:  The flagging and interpretation on this report are based on the  following declared medications.  Unexpected results may arise from  inaccuracies in the declared medications.  **Note: The testing scope of this panel includes these medications:  Hydrocodone  **Note: The testing scope of this panel does not include the  following reported medications:  Acetaminophen  Aspirin  Calcium  Candesartan  Cholecalciferol  Cyanocobalamin  Cyclobenzaprine  Dulaglutide  Gabapentin  Glipizide  Loperamide  Meclizine  Multivitamin  Nystatin  Ropinirole  Warfarin ==================================================================== For clinical consultation, please call [phone]. ====================================================================     Laboratory Chemistry Profile   Renal Lab Results  Component Value Date   BUN 10 07/09/2019   CREATININE 0.63  07/09/2019   GFRAA >60 07/09/2019   GFRNONAA >60 07/09/2019     Hepatic Lab Results  Component Value Date   AST 38 07/09/2019   ALT 43 07/09/2019   ALBUMIN 3.6 07/09/2019   ALKPHOS 51 07/09/2019   LIPASE 23 07/09/2019     Electrolytes Lab Results  Component Value Date   NA 139 07/09/2019   K 3.7 07/09/2019   CL 110 07/09/2019   CALCIUM 8.5 (L) 07/09/2019   MG 2.0 11/02/2017   PHOS 3.3 11/07/2016     Bone Lab Results  Component Value Date   25OHVITD1 25 (L) 11/02/2017   25OHVITD2 <1.0 11/02/2017   25OHVITD3 25 11/02/2017     Inflammation (CRP: Acute Phase) (ESR: Chronic Phase) Lab Results  Component Value Date   CRP 5.3 (H) 11/02/2017   ESRSEDRATE 12 11/02/2017       Note: Above Lab results reviewed.  Imaging  DG PAIN CLINIC C-ARM 1-60 MIN NO REPORT Fluoro was used, but no Radiologist interpretation will be provided.  Please refer to "NOTES" tab for provider progress note.  Assessment  The primary encounter diagnosis was Chronic pain syndrome. 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  Plan of Care  Problem-specific:  No problem-specific Assessment & Plan notes found for this encounter.  Ms. LORYN HAACKE has a current medication list which includes the following long-term medication(s): calcium carbonate, candesartan, glipizide, glipizide, hydrocodone-acetaminophen, ropinirole, tramadol, warfarin, and warfarin.  Pharmacotherapy (Medications Ordered): Meds ordered this encounter  Medications  . HYDROcodone-acetaminophen (NORCO/VICODIN) 5-325 MG tablet    Sig: Take 1 tablet by mouth at bedtime.  Must last 30 days.    Dispense:  30 tablet    Refill:  0    Chronic Pain: STOP Act (Not applicable) Fill 1 day early if closed on refill date. Avoid benzodiazepines within 8 hours of opioids   Orders:  Orders Placed This Encounter  Procedures  . LUMBAR FACET(MEDIAL BRANCH NERVE BLOCK) MBNB    Standing Status:   Future    Standing Expiration Date:   07/19/2020    Scheduling Instructions:     Procedure: Lumbar facet block (AKA.: Lumbosacral medial branch nerve block)     Side: Bilateral     Level: L3-4, L4-5, & L5-S1 Facets (L2, L3, L4, L5, & S1 Medial Branch Nerves)     Sedation: Patient's choice.     Timeframe: ASAA    Order Specific Question:   Where will this procedure be performed?    Answer:   [name]  . Blood Thinner Instructions to Nursing    Always make sure patient has clearance from prescribing physician to stop blood thinners for interventional therapies. If the patient requires a Lovenox-bridge therapy, make sure arrangements are made to institute it with the assistance of the PCP.    Scheduling Instructions:     Have Ms. Robison stop the Coumadin (Warfarin) X  5 days prior to procedure or surgery.   Follow-up plan:   Return for Procedure (w/ sedation): (B) L-FCT Blk , (Blood Thinner Protocol).      Interventional Therapies  Risk  Complexity  Complicating Factors:   1.  Coumadin anticoagulation (Stop:5days  Re-start: 2hrs) 2.  Latex allergy  3.  Morbid obesity (class III) (BMI>40)  4.  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   Completed:   Palliative/Therapeutic left L4-5 LESI x1(11/10/2017) Palliative left lumbar facet blockx13(04/29/2020) Palliativeright lumbar facet block(s) x17(04/29/2020) Palliative right SI joint block x8(not as effective as facet Blk) (03/08/2019) Diagnostic left SI joint block x1 (not as effective as  facet Blk) (03/08/2019) Palliative/Therapeutic (Midline) caudalESIx3(06/08/2018)   Palliative options:   Palliative/Therapeutic left L4-5 LESI #2 Palliative left lumbar facet block#14 Palliativeright lumbar facet block(s) #18 Palliative right SI joint block #9 Diagnostic left SI joint block #2  Palliative/Therapeutic (Midline) caudalESI#4     Recent Visits Date Type Provider Dept  06/16/20 Telemedicine Milinda Pointer, MD [name]  04/29/20 Procedure visit Milinda Pointer, MD [name]  04/23/20 Telemedicine Milinda Pointer, MD [name]  04/14/20 Telemedicine Milinda Pointer, MD [name]  04/03/20 Telemedicine Milinda Pointer, MD [name]  03/27/20 Telemedicine Milinda Pointer, MD [name]  Showing recent visits within past 90 days and meeting all other requirements Today's Visits Date Type Provider Dept  06/18/20 Telemedicine Milinda Pointer, MD [name]  Showing today's visits and meeting all other requirements Future Appointments Date Type Provider Dept  07/23/20 Appointment Milinda Pointer, MD [name]  Showing future appointments within next 90 days and meeting all other requirements  I discussed the assessment and treatment plan with the patient. The patient was provided an opportunity to ask questions and all were answered. The patient agreed with the plan and demonstrated an understanding of the instructions.  Patient advised to call back or seek an in-person evaluation if the symptoms or condition worsens.  Duration of encounter: 15 minutes.  Note by: Gaspar Cola, MD Date: 06/18/2020; Time: 1:46 PM

## 2020-06-24 ENCOUNTER — Ambulatory Visit (HOSPITAL_BASED_OUTPATIENT_CLINIC_OR_DEPARTMENT_OTHER): Payer: PPO | Admitting: Pain Medicine

## 2020-06-24 ENCOUNTER — Encounter: Payer: Self-pay | Admitting: Pain Medicine

## 2020-06-24 ENCOUNTER — Other Ambulatory Visit: Payer: Self-pay

## 2020-06-24 ENCOUNTER — Ambulatory Visit
Admission: RE | Admit: 2020-06-24 | Discharge: 2020-06-24 | Disposition: A | Discharge status: Home / Self Care | Payer: PPO | Source: Ambulatory Visit | Attending: Pain Medicine | Admitting: Pain Medicine

## 2020-06-24 VITALS — BP 158/81 | HR 91 | Temp 97.3°F | Resp 20 | Ht 63.0 in | Wt 218.0 lb

## 2020-06-24 DIAGNOSIS — M4310 Spondylolisthesis, site unspecified: Secondary | ICD-10-CM | POA: Diagnosis not present

## 2020-06-24 DIAGNOSIS — M545 Low back pain, unspecified: Secondary | ICD-10-CM | POA: Diagnosis not present

## 2020-06-24 DIAGNOSIS — G8929 Other chronic pain: Secondary | ICD-10-CM | POA: Insufficient documentation

## 2020-06-24 DIAGNOSIS — Z91040 Latex allergy status: Secondary | ICD-10-CM | POA: Diagnosis not present

## 2020-06-24 DIAGNOSIS — M4300 Spondylolysis, site unspecified: Secondary | ICD-10-CM

## 2020-06-24 DIAGNOSIS — M4306 Spondylolysis, lumbar region: Secondary | ICD-10-CM | POA: Insufficient documentation

## 2020-06-24 DIAGNOSIS — M47816 Spondylosis without myelopathy or radiculopathy, lumbar region: Secondary | ICD-10-CM

## 2020-06-24 DIAGNOSIS — R739 Hyperglycemia, unspecified: Secondary | ICD-10-CM | POA: Diagnosis not present

## 2020-06-24 DIAGNOSIS — G8918 Other acute postprocedural pain: Secondary | ICD-10-CM

## 2020-06-24 DIAGNOSIS — M51379 Other intervertebral disc degeneration, lumbosacral region without mention of lumbar back pain or lower extremity pain: Secondary | ICD-10-CM

## 2020-06-24 DIAGNOSIS — Z7901 Long term (current) use of anticoagulants: Secondary | ICD-10-CM | POA: Insufficient documentation

## 2020-06-24 DIAGNOSIS — M5137 Other intervertebral disc degeneration, lumbosacral region: Secondary | ICD-10-CM

## 2020-06-24 MED ORDER — FENTANYL CITRATE (PF) 100 MCG/2ML IJ SOLN
25.0000 ug | INTRAMUSCULAR | Status: DC | PRN
  Administered 2020-06-24: 50 ug via INTRAVENOUS

## 2020-06-24 MED ORDER — LIDOCAINE HCL 2 % IJ SOLN
20.0000 mL | Freq: Once | INTRAMUSCULAR | Status: DC
  Filled 2020-06-24: qty 20

## 2020-06-24 MED ORDER — ORPHENADRINE CITRATE 30 MG/ML IJ SOLN
60.0000 mg | Freq: Once | INTRAMUSCULAR | Status: AC
  Administered 2020-06-24: 60 mg via INTRAMUSCULAR
  Filled 2020-06-24: qty 2

## 2020-06-24 MED ORDER — ROPIVACAINE HCL 2 MG/ML IJ SOLN
18.0000 mL | Freq: Once | INTRAMUSCULAR | Status: AC
  Administered 2020-06-24: 18 mL via PERINEURAL

## 2020-06-24 MED ORDER — TRIAMCINOLONE ACETONIDE 40 MG/ML IJ SUSP
80.0000 mg | Freq: Once | INTRAMUSCULAR | Status: AC
  Administered 2020-06-24: 80 mg

## 2020-06-24 MED ORDER — MIDAZOLAM HCL 5 MG/5ML IJ SOLN
1.0000 mg | INTRAMUSCULAR | Status: DC | PRN
  Administered 2020-06-24: 1 mg via INTRAVENOUS

## 2020-06-24 MED ORDER — MIDAZOLAM HCL 5 MG/5ML IJ SOLN
INTRAMUSCULAR | Status: AC
  Filled 2020-06-24: qty 5

## 2020-06-24 MED ORDER — LACTATED RINGERS IV SOLN
1000.0000 mL | Freq: Once | INTRAVENOUS | Status: AC
  Administered 2020-06-24: 1000 mL via INTRAVENOUS

## 2020-06-24 MED ORDER — LIDOCAINE HCL (PF) 2 % IJ SOLN
INTRAMUSCULAR | Status: AC
  Filled 2020-06-24: qty 10

## 2020-06-24 MED ORDER — ROPIVACAINE HCL 2 MG/ML IJ SOLN
INTRAMUSCULAR | Status: AC
  Filled 2020-06-24: qty 20

## 2020-06-24 MED ORDER — LIDOCAINE HCL (PF) 2 % IJ SOLN
INTRAMUSCULAR | Status: AC
  Filled 2020-06-24: qty 15

## 2020-06-24 MED ORDER — TRIAMCINOLONE ACETONIDE 40 MG/ML IJ SUSP
INTRAMUSCULAR | Status: AC
  Filled 2020-06-24: qty 2

## 2020-06-24 MED ORDER — FENTANYL CITRATE (PF) 100 MCG/2ML IJ SOLN
INTRAMUSCULAR | Status: AC
  Filled 2020-06-24: qty 2

## 2020-06-24 MED ORDER — KETOROLAC TROMETHAMINE 60 MG/2ML IM SOLN
60.0000 mg | Freq: Once | INTRAMUSCULAR | Status: AC
  Administered 2020-06-24: 60 mg via INTRAMUSCULAR
  Filled 2020-06-24: qty 2

## 2020-06-24 NOTE — Patient Instructions (Signed)
____________________________________________________________________________________________  Post-Procedure Discharge Instructions  Instructions:  Apply ice:   Purpose: This will minimize any swelling and discomfort after procedure.   When: Day of procedure, as soon as you get home.  How: Fill a plastic sandwich bag with crushed ice. Cover it with a small towel and apply to injection site.  How long: (15 min on, 15 min off) Apply for 15 minutes then remove x 15 minutes.  Repeat sequence on day of procedure, until you go to bed.  Apply heat:   Purpose: To treat any soreness and discomfort from the procedure.  When: Starting the next day after the procedure.  How: Apply heat to procedure site starting the day following the procedure.  How long: May continue to repeat daily, until discomfort goes away.  Food intake: Start with clear liquids (like water) and advance to regular food, as tolerated.   Physical activities: Keep activities to a minimum for the first 8 hours after the procedure. After that, then as tolerated.  Driving: If you have received any sedation, be responsible and do not drive. You are not allowed to drive for 24 hours after having sedation.  Blood thinner: (Applies only to those taking blood thinners) You may restart your blood thinner 6 hours after your procedure.  Insulin: (Applies only to Diabetic patients taking insulin) As soon as you can eat, you may resume your normal dosing schedule.  Infection prevention: Keep procedure site clean and dry. Shower daily and clean area with soap and water.  Post-procedure Pain Diary: Extremely important that this be done correctly and accurately. Recorded information will be used to determine the next step in treatment. For the purpose of accuracy, follow these rules:  Evaluate only the area treated. Do not report or include pain from an untreated area. For the purpose of this evaluation, ignore all other areas of pain,  except for the treated area.  After your procedure, avoid taking a long nap and attempting to complete the pain diary after you wake up. Instead, set your alarm clock to go off every hour, on the hour, for the initial 8 hours after the procedure. Document the duration of the numbing medicine, and the relief you are getting from it.  Do not go to sleep and attempt to complete it later. It will not be accurate. If you received sedation, it is likely that you were given a medication that may cause amnesia. Because of this, completing the diary at a later time may cause the information to be inaccurate. This information is needed to plan your care.  Follow-up appointment: Keep your post-procedure follow-up evaluation appointment after the procedure (usually 2 weeks for most procedures, 6 weeks for radiofrequencies). DO NOT FORGET to bring you pain diary with you.   Expect: (What should I expect to see with my procedure?)  From numbing medicine (AKA: Local Anesthetics): Numbness or decrease in pain. You may also experience some weakness, which if present, could last for the duration of the local anesthetic.  Onset: Full effect within 15 minutes of injected.  Duration: It will depend on the type of local anesthetic used. On the average, 1 to 8 hours.   From steroids (Applies only if steroids were used): Decrease in swelling or inflammation. Once inflammation is improved, relief of the pain will follow.  Onset of benefits: Depends on the amount of swelling present. The more swelling, the longer it will take for the benefits to be seen. In some cases, up to 10 days.  Duration: Steroids will stay in the system x 2 weeks. Duration of benefits will depend on multiple posibilities including persistent irritating factors.  Side-effects: If present, they may typically last 2 weeks (the duration of the steroids).  Frequent: Cramps (if they occur, drink Gatorade and take over-the-counter Magnesium 450-500 mg  once to twice a day); water retention with temporary weight gain; increases in blood sugar; decreased immune system response; increased appetite.  Occasional: Facial flushing (red, warm cheeks); mood swings; menstrual changes.  Uncommon: Long-term decrease or suppression of natural hormones; bone thinning. (These are more common with higher doses or more frequent use. This is why we prefer that our patients avoid having any injection therapies in other practices.)   Very Rare: Severe mood changes; psychosis; aseptic necrosis.  From procedure: Some discomfort is to be expected once the numbing medicine wears off. This should be minimal if ice and heat are applied as instructed.  Call if: (When should I call?)  You experience numbness and weakness that gets worse with time, as opposed to wearing off.  New onset bowel or bladder incontinence. (Applies only to procedures done in the spine)  Emergency Numbers:  Durning business hours (Monday - Thursday, 8:00 AM - 4:00 PM) (Friday, 9:00 AM - 12:00 Noon): [phone]  After hours: [phone]  NOTE: If you are having a problem and are unable connect with, or to talk to a provider, then go to your nearest urgent care or emergency department. If the problem is serious and urgent, please call 911. ____________________________________________________________________________________________    ______________________________________________________________________________________________  Body mass index (BMI)  Body mass index (BMI) is a common tool for deciding whether a person has an appropriate body weight.  It measures a persons weight in relation to their height.   According to the [employer] of health (NIH): Marland Kitchen A BMI of less than 18.5 means that a person is underweight. . A BMI of between 18.5 and 24.9 is ideal. . A BMI of between 25 and 29.9 is overweight. . A BMI over 30 indicates obesity.  Weight Management  Required  URGENT: Your weight has been found to be adversely affecting your health.  Dear Ms. Almond Lint:  Your current Estimated body mass index is 38.44 kg/m as calculated from the following:   Height as of 04/29/20: $RemoveBef'5\' 3"'vVSLdxlRuB$  (1.6 m).   Weight as of 04/29/20: 217 lb (98.4 kg).  Please use the table below to identify your weight category and associated incidence of chronic pain, secondary to your weight.  Body Mass Index (BMI) Classification BMI level (kg/m2) Category Associated incidence of chronic pain  <18  Underweight   18.5-24.9 Ideal body weight   25-29.9 Overweight  20%  30-34.9 Obese (Class I)  68%  35-39.9 Severe obesity (Class II)  136%  >40 Extreme obesity (Class III)  254%   In addition: You will be considered "Morbidly Obese", if your BMI is above 30 and you have one or more of the following conditions which are known to be caused and/or directly associated with obesity: 1.    Type 2 Diabetes (Which in turn can lead to cardiovascular diseases (CVD), stroke, peripheral vascular diseases (PVD), retinopathy, nephropathy, and neuropathy) 2.    Cardiovascular Disease (High Blood Pressure; Congestive Heart Failure; High Cholesterol; Coronary Artery Disease; Angina; or History of Heart Attacks) 3.    Breathing problems (Asthma; obesity-hypoventilation syndrome; obstructive sleep apnea; chronic inflammatory airway disease; reactive airway disease; or shortness of breath) 4.    Chronic kidney  disease 5.    Liver disease (nonalcoholic fatty liver disease) 6.    High blood pressure 7.    Acid reflux (gastroesophageal reflux disease; heartburn) 8.    Osteoarthritis (OA) (with any of the following: hip pain; knee pain; and/or low back pain) 9.    Low back pain (Lumbar Facet Syndrome; and/or Degenerative Disc Disease) 10.  Hip pain (Osteoarthritis of hip) (For every 1 lbs of added body weight, there is a 2 lbs increase in pressure inside of each hip articulation. 1:2 mechanical  relationship) 11.  Knee pain (Osteoarthritis of knee) (For every 1 lbs of added body weight, there is a 4 lbs increase in pressure inside of each knee articulation. 1:4 mechanical relationship) (patients with a BMI>30 kg/m2 were 6.8 times more likely to develop knee OA than normal-weight individuals) 12.  Cancer: Epidemiological studies have shown that obesity is a risk factor for: post-menopausal breast cancer; cancers of the endometrium, colon and kidney cancer; malignant adenomas of the oesophagus. Obese subjects have an approximately 1.5-3.5-fold increased risk of developing these cancers compared with normal-weight subjects, and it has been estimated that between 15 and 45% of these cancers can be attributed to overweight. More recent studies suggest that obesity may also increase the risk of other types of cancer, including pancreatic, hepatic and gallbladder cancer. (Ref: Obesity and cancer. Pischon T, Nthlings U, [employer] H. Proc Nutr Soc. 2008 May;67(2):128-45. doi: 11.5726/O0355974163845364.) The International Agency for Research on Cancer (IARC) has identified 13 cancers associated with overweight and obesity: meningioma, multiple myeloma, adenocarcinoma of the esophagus, and cancers of the thyroid, postmenopausal breast cancer, gallbladder, stomach, liver, pancreas, kidney, ovaries, uterus, colon and rectal (colorectal) cancers. 28 percent of all cancers diagnosed in women and 24 percent of those diagnosed in men are associated with overweight and obesity.  Recommendation: At this point it is urgent that you take a step back and concentrate in loosing weight. Dedicate 100% of your efforts on this task. Nothing else will improve your health more than bringing your weight down and your BMI to less than 30. If you are here, you probably have chronic pain. We know that most chronic pain patients have difficulty exercising secondary to their pain. For this reason, you must rely on proper nutrition and diet  in order to lose the weight. If your BMI is above 40, you should seriously consider bariatric surgery. A realistic goal is to lose 10% of your body weight over a period of 12 months.  Be honest to yourself, if over time you have unsuccessfully tried to lose weight, then it is time for you to seek professional help and to enter a medically supervised weight management program, and/or undergo bariatric surgery. Stop procrastinating.   Pain management considerations:  1.    Pharmacological Problems: Be advised that the use of opioid analgesics (oxycodone; hydrocodone; morphine; methadone; codeine; and all of their derivatives) have been associated with decreased metabolism and weight gain.  For this reason, should we see that you are unable to lose weight while taking these medications, it may become necessary for [place] to taper down and indefinitely discontinue them.  2.    Technical Problems: The incidence of successful interventional therapies decreases as the patient's BMI increases. It is much more difficult to accomplish a safe and effective interventional therapy on a patient with a BMI above 35. 3.    Radiation Exposure Problems: The x-rays machine, used to accomplish injection therapies, will automatically increase their x-ray output in order to  capture an appropriate bone image. This means that radiation exposure increases exponentially with the patient's BMI. (The higher the BMI, the higher the radiation exposure.) Although the level of radiation used at a given time is still safe to the patient, it is not for the physician and/or assisting staff. Unfortunately, radiation exposure is accumulative. Because physicians and the staff have to do procedures and be exposed on a daily basis, this can result in health problems such as cancer and radiation burns. Radiation exposure to the staff is monitored by the radiation batches that they wear. The exposure levels are reported back to the staff on a quarterly  basis. Depending on levels of exposure, physicians and staff may be obligated by law to decrease this exposure. This means that they have the right and obligation to refuse providing therapies where they may be overexposed to radiation. For this reason, physicians may decline to offer therapies such as radiofrequency ablation or implants to patients with a BMI above 40. 4.    Current Trends: Be advised that the current trend is to no longer offer certain therapies to patients with a BMI equal to, or above 35, due to increase perioperative risks, increased technical procedural difficulties, and excessive radiation exposure to healthcare personnel.  ______________________________________________________________________________________________

## 2020-06-24 NOTE — Progress Notes (Signed)
Safety precautions to be maintained throughout the outpatient stay will include: orient to surroundings, keep bed in low position, maintain call bell within reach at all times, provide assistance with transfer out of bed and ambulation.  

## 2020-06-24 NOTE — Progress Notes (Signed)
PROVIDER NOTE: Information contained herein reflects review and annotations entered in association with encounter. Interpretation of such information and data should be left to medically-trained personnel. Information provided to patient can be located elsewhere in the medical record under "Patient Instructions". Document created using STT-dictation technology, any transcriptional errors that may result from process are unintentional.    Patient: Kara [name]  Service Category: Procedure  Provider: Gaspar Cola, MD  DOB: [birth year]  DOS: 06/24/2020  Location: [facility]  MRN: [id number]  Setting: Ambulatory - outpatient  Referring Provider: Tracie Harrier, MD  Type: Established Patient  Specialty: Interventional Pain Management  PCP: Tracie Harrier, MD   Primary Reason for Visit: Interventional Pain Management Treatment. CC: Back Pain  Procedure:          Anesthesia, Analgesia, Anxiolysis:  Type: Lumbar Facet, Medial Branch Block(s)          Primary Purpose: Palliative Region: Posterolateral Lumbosacral Spine Level: L2, L3, L4, L5, & S1 Medial Branch Level(s). Injecting these levels blocks the L3-4, L4-5, and L5-S1 lumbar facet joints. Laterality: Bilateral  Type: Moderate (Conscious) Sedation combined with Local Anesthesia Indication(s): Analgesia and Anxiety Route: Intravenous (IV) IV Access: Secured Sedation: Meaningful verbal contact was maintained at all times during the procedure  Local Anesthetic: Lidocaine 1-2%  Position: Prone   Indications: 1. Lumbar facet syndrome (Bilateral)   2. Lumbar facet arthropathy (L3-4, L4-5, and L5-S1) (Bilateral)   3. 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 DDD (degenerative disc disease), lumbosacral   7. Chronic low back pain (1ry area of Pain) (Bilateral) w/o sciatica   8. Chronic  anticoagulation (COUMADIN)   9. History of allergy to latex   10. Latex precautions, history of latex allergy   11. Severe obesity (BMI 35.0-39.9) with comorbidity (HCC)    Pain Score: Pre-procedure: 10-Worst pain ever/10 Post-procedure: 3 /10   Pre-op H&P Assessment:  Kara [name] is a 70 y.o. (year old), female patient, seen today for interventional treatment. She  has a past surgical history that includes Knee surgery; Carpal tunnel release (Bilateral); Colon resection sigmoid (N/A, 11/02/2016); Colostomy (N/A, 11/02/2016); Sigmoidoscopy (N/A, 11/13/2016); PULMONARY VENOGRAPHY (N/A, 11/19/2016); Colonoscopy with propofol (N/A, 02/03/2017); Colon surgery (11/02/2016); IVC FILTER INSERTION (Right, 10/2016); Colostomy reversal (N/A, 02/24/2017); Appendectomy (N/A, 02/24/2017); Lysis of adhesion (N/A, 02/24/2017); laparoscopy (N/A, 02/24/2017); Ileo loop diversion (N/A, 02/24/2017); Sigmoidoscopy (N/A, 05/19/2017); Ileostomy closure (N/A, 06/01/2017); IVC FILTER REMOVAL (N/A, 08/09/2017); Sacroplasty (N/A, 11/08/2017); Cataract extraction w/PHACO (Left, 11/21/2018); and Cataract extraction w/PHACO (Right, 12/12/2018). Ms. Kreager has a current medication list which includes the following prescription(s): aspirin ec, calcium carbonate, calcium polycarbophil, candesartan, vitamin d3, cyclobenzaprine, dulaglutide, gabapentin, gabapentin, glipizide, glipizide, hydrocodone-acetaminophen, meclizine, multi-vitamins, nystatin cream, ropinirole, tolterodine, tramadol, vitamin b-12, warfarin, and warfarin, and the following Facility-Administered Medications: fentanyl, lidocaine, and midazolam. Her primarily concern today is the Back Pain  Initial Vital Signs:  Pulse/HCG Rate: 91ECG Heart Rate: 90 Temp: (!) 97.2 F (36.2 C) Resp: 16 BP: (!) 143/69 SpO2: 97 %  BMI: Estimated body mass index is 38.62 kg/m as calculated from the following:   Height as of this encounter: 5\' 3"  (1.6 m).   Weight as of this  encounter: 218 lb (98.9 kg).  Risk Assessment: Allergies: Reviewed. She is allergic to adhesive [tape], other, latex, and morphine and related.  Allergy Precautions: None required Coagulopathies: Reviewed. None identified.  Blood-thinner therapy: None at this time Active Infection(s): Reviewed. None identified. Kara [name] is afebrile  Site Confirmation: Ms. Thorington was asked to confirm the procedure and laterality before marking the site Procedure checklist: Completed Consent: Before the procedure and under the influence of no sedative(s), amnesic(s), or anxiolytics, the patient was informed of the treatment options, risks and possible complications. To fulfill our ethical and legal obligations, as recommended by the American Medical Association's Code of Ethics, I have informed the patient of my clinical impression; the nature and purpose of the treatment or procedure; the risks, benefits, and possible complications of the intervention; the alternatives, including doing nothing; the risk(s) and benefit(s) of the alternative treatment(s) or procedure(s); and the risk(s) and benefit(s) of doing nothing. The patient was provided information about the general risks and possible complications associated with the procedure. These may include, but are not limited to: failure to achieve desired goals, infection, bleeding, organ or nerve damage, allergic reactions, paralysis, and death. In addition, the patient was informed of those risks and complications associated to Spine-related procedures, such as failure to decrease pain; infection (i.e.: Meningitis, epidural or intraspinal abscess); bleeding (i.e.: epidural hematoma, subarachnoid hemorrhage, or any other type of intraspinal or peri-dural bleeding); organ or nerve damage (i.e.: Any type of peripheral nerve, nerve root, or spinal cord injury) with subsequent damage to sensory, motor, and/or autonomic systems, resulting in permanent  pain, numbness, and/or weakness of one or several areas of the body; allergic reactions; (i.e.: anaphylactic reaction); and/or death. Furthermore, the patient was informed of those risks and complications associated with the medications. These include, but are not limited to: allergic reactions (i.e.: anaphylactic or anaphylactoid reaction(s)); adrenal axis suppression; blood sugar elevation that in diabetics may result in ketoacidosis or comma; water retention that in patients with history of congestive heart failure may result in shortness of breath, pulmonary edema, and decompensation with resultant heart failure; weight gain; swelling or edema; medication-induced neural toxicity; particulate matter embolism and blood vessel occlusion with resultant organ, and/or nervous system infarction; and/or aseptic necrosis of one or more joints. Finally, the patient was informed that Medicine is not an exact science; therefore, there is also the possibility of unforeseen or unpredictable risks and/or possible complications that may result in a catastrophic outcome. The patient indicated having understood very clearly. We have given the patient no guarantees and we have made no promises. Enough time was given to the patient to ask questions, all of which were answered to the patient's satisfaction. Ms. Vine has indicated that she wanted to continue with the procedure. Attestation: I, the ordering provider, attest that I have discussed with the patient the benefits, risks, side-effects, alternatives, likelihood of achieving goals, and potential problems during recovery for the procedure that I have provided informed consent. Date  Time: 06/24/2020 10:58 AM  Pre-Procedure Preparation:  Monitoring: As per clinic protocol. Respiration, ETCO2, SpO2, BP, heart rate and rhythm monitor placed and checked for adequate function Safety Precautions: Patient was assessed for positional comfort and pressure points  before starting the procedure. Time-out: I initiated and conducted the "Time-out" before starting the procedure, as per protocol. The patient was asked to participate by confirming the accuracy of the "Time Out" information. Verification of the correct person, site, and procedure were performed and confirmed by me, the nursing staff, and the patient. "Time-out" conducted as per Joint Commission's Universal Protocol (UP.01.01.01). Time: 1153  Description of Procedure:          Laterality: Bilateral. The procedure  was performed in identical fashion on both sides. Levels:  L2, L3, L4, L5, & S1 Medial Branch Level(s) Area Prepped: Posterior Lumbosacral Region DuraPrep (Iodine Povacrylex [0.7% available iodine] and Isopropyl Alcohol, 74% w/w) Safety Precautions: Aspiration looking for blood return was conducted prior to all injections. At no point did we inject any substances, as a needle was being advanced. Before injecting, the patient was told to immediately notify me if she was experiencing any new onset of "ringing in the ears, or metallic taste in the mouth". No attempts were made at seeking any paresthesias. Safe injection practices and needle disposal techniques used. Medications properly checked for expiration dates. SDV (single dose vial) medications used. After the completion of the procedure, all disposable equipment used was discarded in the proper designated medical waste containers. Local Anesthesia: Protocol guidelines were followed. The patient was positioned over the fluoroscopy table. The area was prepped in the usual manner. The time-out was completed. The target area was identified using fluoroscopy. A 12-in long, straight, sterile hemostat was used with fluoroscopic guidance to locate the targets for each level blocked. Once located, the skin was marked with an approved surgical skin marker. Once all sites were marked, the skin (epidermis, dermis, and hypodermis), as well as deeper tissues  (fat, connective tissue and muscle) were infiltrated with a small amount of a short-acting local anesthetic, loaded on a 10cc syringe with a 25G, 1.5-in  Needle. An appropriate amount of time was allowed for local anesthetics to take effect before proceeding to the next step. Local Anesthetic: Lidocaine 2.0% The unused portion of the local anesthetic was discarded in the proper designated containers. Technical explanation of process:  L2 Medial Branch Nerve Block (MBB): The target area for the L2 medial branch is at the junction of the postero-lateral aspect of the superior articular process and the superior, posterior, and medial edge of the transverse process of L3. Under fluoroscopic guidance, a Quincke needle was inserted until contact was made with os over the superior postero-lateral aspect of the pedicular shadow (target area). After negative aspiration for blood, 0.5 mL of the nerve block solution was injected without difficulty or complication. The needle was removed intact. L3 Medial Branch Nerve Block (MBB): The target area for the L3 medial branch is at the junction of the postero-lateral aspect of the superior articular process and the superior, posterior, and medial edge of the transverse process of L4. Under fluoroscopic guidance, a Quincke needle was inserted until contact was made with os over the superior postero-lateral aspect of the pedicular shadow (target area). After negative aspiration for blood, 0.5 mL of the nerve block solution was injected without difficulty or complication. The needle was removed intact. L4 Medial Branch Nerve Block (MBB): The target area for the L4 medial branch is at the junction of the postero-lateral aspect of the superior articular process and the superior, posterior, and medial edge of the transverse process of L5. Under fluoroscopic guidance, a Quincke needle was inserted until contact was made with os over the superior postero-lateral aspect of the pedicular  shadow (target area). After negative aspiration for blood, 0.5 mL of the nerve block solution was injected without difficulty or complication. The needle was removed intact. L5 Medial Branch Nerve Block (MBB): The target area for the L5 medial branch is at the junction of the postero-lateral aspect of the superior articular process and the superior, posterior, and medial edge of the sacral ala. Under fluoroscopic guidance, a Quincke needle was inserted until contact  was made with os over the superior postero-lateral aspect of the pedicular shadow (target area). After negative aspiration for blood, 0.5 mL of the nerve block solution was injected without difficulty or complication. The needle was removed intact. S1 Medial Branch Nerve Block (MBB): The target area for the S1 medial branch is at the posterior and inferior 6 o'clock position of the L5-S1 facet joint. Under fluoroscopic guidance, the Quincke needle inserted for the L5 MBB was redirected until contact was made with os over the inferior and postero aspect of the sacrum, at the 6 o' clock position under the L5-S1 facet joint (Target area). After negative aspiration for blood, 0.5 mL of the nerve block solution was injected without difficulty or complication. The needle was removed intact.  Nerve block solution: 0.2% PF-Ropivacaine + Triamcinolone (40 mg/mL) diluted to a final concentration of 4 mg of Triamcinolone/mL of Ropivacaine The unused portion of the solution was discarded in the proper designated containers. Procedural Needles: 22-gauge, 3.5-inch, Quincke needles used for all levels.  Once the entire procedure was completed, the treated area was cleaned, making sure to leave some of the prepping solution back to take advantage of its long term bactericidal properties.   Illustration of the posterior view of the lumbar spine and the posterior neural structures. Laminae of L2 through S1 are labeled. DPRL5, dorsal primary ramus of L5; DPRS1,  dorsal primary ramus of S1; DPR3, dorsal primary ramus of L3; FJ, facet (zygapophyseal) joint L3-L4; I, inferior articular process of L4; LB1, lateral branch of dorsal primary ramus of L1; IAB, inferior articular branches from L3 medial branch (supplies L4-L5 facet joint); IBP, intermediate branch plexus; MB3, medial branch of dorsal primary ramus of L3; NR3, third lumbar nerve root; S, superior articular process of L5; SAB, superior articular branches from L4 (supplies L4-5 facet joint also); TP3, transverse process of L3.  Vitals:   06/24/20 1207 06/24/20 1216 06/24/20 1226 06/24/20 1236  BP: 131/86 (!) 144/61 (!) 147/81 (!) 158/81  Pulse:      Resp: 20 20 18 20   Temp:  (!) 97.3 F (36.3 C)  (!) 97.3 F (36.3 C)  SpO2: 97% 98% 98% 99%  Weight:      Height:         Start Time: 1153 hrs. End Time: 1206 hrs.  Imaging Guidance (Spinal):          Type of Imaging Technique: Fluoroscopy Guidance (Spinal) Indication(s): Assistance in needle guidance and placement for procedures requiring needle placement in or near specific anatomical locations not easily accessible without such assistance. Exposure Time: Please see nurses notes. Contrast: None used. Fluoroscopic Guidance: I was personally present during the use of fluoroscopy. "Tunnel Vision Technique" used to obtain the best possible view of the target area. Parallax error corrected before commencing the procedure. "Direction-depth-direction" technique used to introduce the needle under continuous pulsed fluoroscopy. Once target was reached, antero-posterior, oblique, and lateral fluoroscopic projection used confirm needle placement in all planes. Images permanently stored in EMR. Interpretation: No contrast injected. I personally interpreted the imaging intraoperatively. Adequate needle placement confirmed in multiple planes. Permanent images saved into the patient's record.  Antibiotic Prophylaxis:   Anti-infectives (From admission, onward)    None     Indication(s): None identified  Post-operative Assessment:  Post-procedure Vital Signs:  Pulse/HCG Rate: 9187 Temp: (!) 97.3 F (36.3 C) Resp: 20 BP: (!) 158/81 SpO2: 99 %  EBL: None  Complications: No immediate post-treatment complications observed by team, or reported by patient.  Note: The patient tolerated the entire procedure well. A repeat set of vitals were taken after the procedure and the patient was kept under observation following institutional policy, for this type of procedure. Post-procedural neurological assessment was performed, showing return to baseline, prior to discharge. The patient was provided with post-procedure discharge instructions, including a section on how to identify potential problems. Should any problems arise concerning this procedure, the patient was given instructions to immediately contact us, at any time, without hesitation. In any case, we plan to contact the patient by telephone for a follow-up status report regarding this interventional procedure.  Comments:  No additional relevant information.  Plan of Care  Orders:  Orders Placed This Encounter  Procedures  . LUMBAR FACET(MEDIAL BRANCH NERVE BLOCK) MBNB    Scheduling Instructions:     Procedure: Lumbar facet block (AKA.: Lumbosacral medial branch nerve block)     Side: Bilateral     Level: L3-4, L4-5, & L5-S1 Facets (L2, L3, L4, L5, & S1 Medial Branch Nerves)     Sedation: Patient's choice.     Timeframe: Today    Order Specific Question:   Where will this procedure be performed?    Answer:   [name]  . DG [facility] C-ARM 1-60 MIN NO REPORT    Intraoperative interpretation by procedural physician at [facility].    Standing Status:   Standing    Number of Occurrences:   1    Order Specific Question:   Reason for exam:    Answer:   Assistance in needle guidance and placement for procedures requiring needle placement in or near specific anatomical  locations not easily accessible without such assistance.  . Hemoglobin A1c    Order Specific Question:   CC Results    Answer:   [id number] [[id number]]    Order Specific Question:   Release to patient    Answer:   Immediate  . Informed Consent Details: Physician/Practitioner Attestation; Transcribe to consent form and obtain patient signature    Nursing Order: Transcribe to consent form and obtain patient signature. Note: Always confirm laterality of pain with Ms. Bufano, before procedure.    Order Specific Question:   Physician/Practitioner attestation of informed consent for procedure/surgical case    Answer:   I, the physician/practitioner, attest that I have discussed with the patient the benefits, risks, side effects, alternatives, likelihood of achieving goals and potential problems during recovery for the procedure that I have provided informed consent.    Order Specific Question:   Procedure    Answer:   Lumbar Facet Block  under fluoroscopic guidance    Order Specific Question:   Physician/Practitioner performing the procedure    Answer:   Cheney Ewart A. Dossie Arbour MD    Order Specific Question:   Indication/Reason    Answer:   Low Back Pain, with our without leg pain, due to Facet Joint Arthralgia (Joint Pain) Spondylosis (Arthritis of the Spine), without myelopathy or radiculopathy (Nerve Damage).  . Care order/instruction: Please confirm that the patient has stopped the Coumadin (Warfarin) X 5 days prior to procedure or surgery.    Please confirm that the patient has stopped the Coumadin (Warfarin) X 5 days prior to procedure or surgery.    Standing Status:   Standing    Number of Occurrences:   1  . Provide equipment / supplies at bedside    "Block Tray" (Disposable  single use) Needle type: SpinalSpinal Amount/quantity: 4 Size: Long (7-inch) Gauge: 22G  Standing Status:   Standing    Number of Occurrences:   1    Order Specific Question:   Specify    Answer:   Block  Tray  . Nursing communication    Please go ahead and get a POC blood sugar level and get the lab to do a hemoglobin A1c before the procedure.  . Bleeding precautions    Standing Status:   Standing    Number of Occurrences:   1  . Latex precautions    Activate Latex-Free Protocol.    Standing Status:   Standing    Number of Occurrences:   1  . Latex precautions   Chronic Opioid Analgesic:  Hydrocodone/APAP 5/325, 1 tab PO QD PRN (5 mg/day of hydrocodone) MME: 5 mg/day.   Medications ordered for procedure: Meds ordered this encounter  Medications  . lidocaine (XYLOCAINE) 2 % (with pres) injection 400 mg  . lactated ringers infusion 1,000 mL  . midazolam (VERSED) 5 MG/5ML injection 1-2 mg    Make sure Flumazenil is available in the pyxis when using this medication. If oversedation occurs, administer 0.2 mg IV over 15 sec. If after 45 sec no response, administer 0.2 mg again over 1 min; may repeat at 1 min intervals; not to exceed 4 doses (1 mg)  . fentaNYL (SUBLIMAZE) injection 25-50 mcg    Make sure Narcan is available in the pyxis when using this medication. In the event of respiratory depression (RR< 8/min): Titrate NARCAN (naloxone) in increments of 0.1 to 0.2 mg IV at 2-3 minute intervals, until desired degree of reversal.  . ropivacaine (PF) 2 mg/mL (0.2%) (NAROPIN) injection 18 mL  . triamcinolone acetonide (KENALOG-40) injection 80 mg  . ketorolac (TORADOL) injection 60 mg  . orphenadrine (NORFLEX) injection 60 mg   Medications administered: We administered lactated ringers, midazolam, fentaNYL, ropivacaine (PF) 2 mg/mL (0.2%), triamcinolone acetonide, ketorolac, and orphenadrine.  See the medical record for exact dosing, route, and time of administration.  Follow-up plan:   Return in about 2 weeks (around 07/08/2020) for (F2F), (PP) Follow-up.       Interventional Therapies  Risk  Complexity  Complicating Factors:   1.  Coumadin anticoagulation (Stop:5days  Re-start:  2hrs) 2.  Latex allergy  3.  Morbid obesity (class III) (BMI>40)  4.  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   Completed:   Palliative/Therapeutic left L4-5 LESI x1(11/10/2017) Palliative left lumbar facet blockx13(04/29/2020) Palliativeright lumbar facet block(s) x17(04/29/2020) Palliative right SI joint block x8(not as effective as facet Blk) (03/08/2019) Diagnostic left SI joint block x1 (not as effective as facet Blk) (03/08/2019) Palliative/Therapeutic (Midline) caudalESIx3(06/08/2018)   Palliative options:   Palliative/Therapeutic left L4-5 LESI #2 Palliative left lumbar facet block#14 Palliativeright lumbar facet block(s) #18 Palliative right SI joint block #9 Diagnostic left SI joint block #2  Palliative/Therapeutic (Midline) caudalESI#4    Recent Visits Date Type Provider Dept  06/18/20 Telemedicine Milinda Pointer, MD [name]  04/29/20 Procedure visit Milinda Pointer, MD [name]  04/23/20 Telemedicine Milinda Pointer, MD [name]  04/14/20 Telemedicine Milinda Pointer, MD [name]  04/03/20 Telemedicine Milinda Pointer, MD [name]  03/27/20 Telemedicine Milinda Pointer, MD [name]  Showing recent visits within past 90 days and meeting all other requirements Today's Visits Date Type Provider Dept  06/24/20 Procedure visit Milinda Pointer, MD [name]  Showing today's visits and meeting all other requirements Future Appointments Date Type Provider Dept  07/09/20 Appointment Milinda Pointer, MD [name]  07/23/20 Appointment Milinda Pointer, MD [name]  Showing future appointments within next 90 days and meeting all other requirements  Disposition: Discharge home  Discharge (Date  Time):  06/24/2020; 1237 hrs.   Primary Care Physician: Tracie Harrier, MD Location: [facility] Note by: Gaspar Cola, MD Date: 06/24/2020; Time: 1:23 PM  Disclaimer:  Medicine is not an Chief Strategy Officer. The only guarantee in medicine is that nothing is guaranteed. It is important to note that the decision to proceed with this intervention was based on the information collected from the patient. The Data and conclusions were drawn from the patient's questionnaire, the interview, and the physical examination. Because the information was provided in large part by the patient, it cannot be guaranteed that it has not been purposely or unconsciously manipulated. Every effort has been made to obtain as much relevant data as possible for this evaluation. It is important to note that the conclusions that lead to this procedure are derived in large part from the available data. Always take into account that the treatment will also be dependent on availability of resources and existing treatment guidelines, considered by other Pain Management Practitioners as being common knowledge and practice, at the time of the intervention. For Medico-Legal purposes, it is also important to point out that variation in procedural techniques and pharmacological choices are the acceptable norm. The indications, contraindications, technique, and results of the above procedure should only be interpreted and judged by a Board-Certified Interventional Pain Specialist with extensive familiarity and expertise in the same exact procedure and technique.

## 2020-06-25 ENCOUNTER — Telehealth: Payer: Self-pay

## 2020-06-25 LAB — HEMOGLOBIN A1C
Est. average glucose Bld gHb Est-mCnc: 137 mg/dL
Hgb A1c MFr Bld: 6.4 % — ABNORMAL HIGH (ref 4.8–5.6)

## 2020-06-25 NOTE — Telephone Encounter (Signed)
Post procedure follow up.  LM 

## 2020-07-03 ENCOUNTER — Emergency Department
Admission: EM | Admit: 2020-07-03 | Discharge: 2020-07-03 | Disposition: A | Discharge status: Home / Self Care | Payer: PPO | Attending: Student in an Organized Health Care Education/Training Program | Admitting: Student in an Organized Health Care Education/Training Program

## 2020-07-03 ENCOUNTER — Emergency Department: Payer: PPO

## 2020-07-03 ENCOUNTER — Other Ambulatory Visit: Payer: Self-pay

## 2020-07-03 DIAGNOSIS — Z91040 Latex allergy status: Secondary | ICD-10-CM | POA: Diagnosis not present

## 2020-07-03 DIAGNOSIS — Z7984 Long term (current) use of oral hypoglycemic drugs: Secondary | ICD-10-CM | POA: Diagnosis not present

## 2020-07-03 DIAGNOSIS — E1165 Type 2 diabetes mellitus with hyperglycemia: Secondary | ICD-10-CM | POA: Insufficient documentation

## 2020-07-03 DIAGNOSIS — Z7982 Long term (current) use of aspirin: Secondary | ICD-10-CM | POA: Diagnosis not present

## 2020-07-03 DIAGNOSIS — Z86718 Personal history of other venous thrombosis and embolism: Secondary | ICD-10-CM | POA: Diagnosis not present

## 2020-07-03 DIAGNOSIS — R110 Nausea: Secondary | ICD-10-CM | POA: Diagnosis not present

## 2020-07-03 DIAGNOSIS — I110 Hypertensive heart disease with heart failure: Secondary | ICD-10-CM | POA: Insufficient documentation

## 2020-07-03 DIAGNOSIS — E1140 Type 2 diabetes mellitus with diabetic neuropathy, unspecified: Secondary | ICD-10-CM | POA: Insufficient documentation

## 2020-07-03 DIAGNOSIS — R112 Nausea with vomiting, unspecified: Secondary | ICD-10-CM | POA: Insufficient documentation

## 2020-07-03 DIAGNOSIS — K760 Fatty (change of) liver, not elsewhere classified: Secondary | ICD-10-CM | POA: Diagnosis not present

## 2020-07-03 DIAGNOSIS — R0902 Hypoxemia: Secondary | ICD-10-CM | POA: Diagnosis not present

## 2020-07-03 DIAGNOSIS — R109 Unspecified abdominal pain: Secondary | ICD-10-CM | POA: Insufficient documentation

## 2020-07-03 DIAGNOSIS — Z86711 Personal history of pulmonary embolism: Secondary | ICD-10-CM | POA: Insufficient documentation

## 2020-07-03 DIAGNOSIS — R5381 Other malaise: Secondary | ICD-10-CM | POA: Diagnosis not present

## 2020-07-03 DIAGNOSIS — Z7901 Long term (current) use of anticoagulants: Secondary | ICD-10-CM | POA: Diagnosis not present

## 2020-07-03 DIAGNOSIS — R531 Weakness: Secondary | ICD-10-CM | POA: Diagnosis not present

## 2020-07-03 DIAGNOSIS — R1110 Vomiting, unspecified: Secondary | ICD-10-CM | POA: Diagnosis not present

## 2020-07-03 DIAGNOSIS — I5023 Acute on chronic systolic (congestive) heart failure: Secondary | ICD-10-CM | POA: Insufficient documentation

## 2020-07-03 DIAGNOSIS — R197 Diarrhea, unspecified: Secondary | ICD-10-CM | POA: Diagnosis not present

## 2020-07-03 DIAGNOSIS — I10 Essential (primary) hypertension: Secondary | ICD-10-CM | POA: Diagnosis not present

## 2020-07-03 LAB — COMPREHENSIVE METABOLIC PANEL
ALT: 31 U/L (ref 0–44)
AST: 23 U/L (ref 15–41)
Albumin: 3.3 g/dL — ABNORMAL LOW (ref 3.5–5.0)
Alkaline Phosphatase: 41 U/L (ref 38–126)
Anion gap: 11 (ref 5–15)
BUN: 23 mg/dL (ref 8–23)
CO2: 21 mmol/L — ABNORMAL LOW (ref 22–32)
Calcium: 8.2 mg/dL — ABNORMAL LOW (ref 8.9–10.3)
Chloride: 106 mmol/L (ref 98–111)
Creatinine, Ser: 0.84 mg/dL (ref 0.44–1.00)
GFR, Estimated: >60 mL/min (ref >60)
Glucose, Bld: 325 mg/dL — ABNORMAL HIGH (ref 70–99)
Potassium: 3.8 mmol/L (ref 3.5–5.1)
Sodium: 138 mmol/L (ref 135–145)
Total Bilirubin: 0.9 mg/dL (ref 0.3–1.2)
Total Protein: 6.3 g/dL — ABNORMAL LOW (ref 6.5–8.1)

## 2020-07-03 LAB — CBC WITH DIFFERENTIAL/PLATELET
Abs Immature Granulocytes: 0.03 10*3/uL (ref 0.00–0.07)
Basophils Absolute: 0 10*3/uL (ref 0.0–0.1)
Basophils Relative: 0 %
Eosinophils Absolute: 0.1 10*3/uL (ref 0.0–0.5)
Eosinophils Relative: 1 %
HCT: 42.6 % (ref 36.0–46.0)
Hemoglobin: 13.7 g/dL (ref 12.0–15.0)
Immature Granulocytes: 0 %
Lymphocytes Relative: 3 %
Lymphs Abs: 0.2 10*3/uL — ABNORMAL LOW (ref 0.7–4.0)
MCH: 29.2 pg (ref 26.0–34.0)
MCHC: 32.2 g/dL (ref 30.0–36.0)
MCV: 90.8 fL (ref 80.0–100.0)
Monocytes Absolute: 0.5 10*3/uL (ref 0.1–1.0)
Monocytes Relative: 6 %
Neutro Abs: 7.8 10*3/uL — ABNORMAL HIGH (ref 1.7–7.7)
Neutrophils Relative %: 90 %
Platelets: 200 10*3/uL (ref 150–400)
RBC: 4.69 MIL/uL (ref 3.87–5.11)
RDW: 13.8 % (ref 11.5–15.5)
WBC: 8.6 10*3/uL (ref 4.0–10.5)
nRBC: 0 % (ref 0.0–0.2)

## 2020-07-03 LAB — LIPASE, BLOOD: Lipase: 34 U/L (ref 11–51)

## 2020-07-03 MED ORDER — ONDANSETRON 4 MG PO TBDP: 4.0000 mg | ORAL_TABLET | Freq: Three times a day (TID) | ORAL | 0 refills | Status: DC | PRN

## 2020-07-03 MED ORDER — ONDANSETRON HCL 4 MG/2ML IJ SOLN
4.0000 mg | Freq: Once | INTRAMUSCULAR | Status: AC
  Administered 2020-07-03: 4 mg via INTRAVENOUS
  Filled 2020-07-03: qty 2

## 2020-07-03 MED ORDER — IOHEXOL 300 MG/ML  SOLN
100.0000 mL | Freq: Once | INTRAMUSCULAR | Status: AC | PRN
  Administered 2020-07-03: 100 mL via INTRAVENOUS

## 2020-07-03 MED ORDER — SODIUM CHLORIDE 0.9 % IV BOLUS
500.0000 mL | Freq: Once | INTRAVENOUS | Status: AC
  Administered 2020-07-03: 500 mL via INTRAVENOUS

## 2020-07-03 MED ORDER — INSULIN ASPART 100 UNIT/ML ~~LOC~~ SOLN
6.0000 [IU] | Freq: Once | SUBCUTANEOUS | Status: AC
  Administered 2020-07-03: 6 [IU] via INTRAVENOUS
  Filled 2020-07-03: qty 1

## 2020-07-03 NOTE — ED Notes (Signed)
Rapid COVID, negative, MD aware.

## 2020-07-03 NOTE — ED Provider Notes (Signed)
[facility] Emergency Department Provider Note    Event Date/Time   First MD Initiated Contact with Patient 07/03/20 [id number]     (approximate)  I have reviewed the triage vital signs and the nursing notes.   HISTORY  Chief Complaint Emesis and Weakness    HPI Kara [name] is a 70 y.o. female with the below listed past medical history presents to the ER for evaluation of nausea vomiting and diarrhea as well as crampy abdominal pain that occurred last night after the patient ate pasta salad that was prepared by her husband.  Her husband has not had similar symptoms states that she has a sensitive stomach.  States that she spent most of the evening on the commode.  Had 4 episodes of vomiting.  States her stool is nonmelanotic no hematochezia.  Several weeks ago did have a course of antibiotics for UTI.    Past Medical History:  Diagnosis Date  . Anginal pain ([place])   . 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 CHF (congestive heart failure) ([place])    followed colon resection, "wouldn't let me get up"  . Diabetes ([place])    type 2  . Diverticulitis   . History of being hospitalized    1 month ago for 3 days, vomiting, and kink in upper intestine  . History of hiatal hernia    ?  Marland Kitchen Hypertension   . Osteoporosis   . PE (pulmonary thromboembolism) ([place])   . 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 Vertigo    Family History  Problem Relation Age of Onset  . Diabetes Mother   . Heart disease Mother   . Cancer Mother   . Breast cancer Mother        >50  . Heart disease Father   . Diabetes Sister   . Heart disease Sister    Past Surgical History:  Procedure Laterality Date  . APPENDECTOMY N/A 02/24/2017   Procedure: Incidental  APPENDECTOMY;  Surgeon: [name], MD;  Location: [name];  Service: General;  Laterality: N/A;  . CARPAL TUNNEL RELEASE Bilateral   . CATARACT EXTRACTION W/PHACO Left 11/21/2018    Procedure: CATARACT EXTRACTION PHACO AND INTRAOCULAR LENS PLACEMENT ([place]) LEFT DIABETES;  Surgeon: Birder Robson, MD;  Location: [place];  Service: Ophthalmology;  Laterality: Left;  latex sensitivity Diabetic - oral meds  . CATARACT EXTRACTION W/PHACO Right 12/12/2018   Procedure: CATARACT EXTRACTION PHACO AND INTRAOCULAR LENS PLACEMENT ([place]) RIGHT DIABETES;  Surgeon: Birder Robson, MD;  Location: [place] [facility];  Service: Ophthalmology;  Laterality: Right;  Diabetes-oral med Latex sensitiviy  . COLON RESECTION SIGMOID N/A 11/02/2016   Procedure: COLON RESECTION SIGMOID;  Surgeon: [name], MD;  Location: [name];  Service: General;  Laterality: N/A;  . COLON SURGERY  11/02/2016  . COLONOSCOPY WITH PROPOFOL N/A 02/03/2017   Procedure: COLONOSCOPY WITH PROPOFOL;  Surgeon: Lin Landsman, MD;  Location: [facility] ENDOSCOPY;  Service: Gastroenterology;  Laterality: N/A;  . COLOSTOMY N/A 11/02/2016   Procedure: COLOSTOMY;  Surgeon: [name], MD;  Location: [name];  Service: General;  Laterality: N/A;  . COLOSTOMY REVERSAL N/A 02/24/2017   Procedure: COLOSTOMY REVERSAL, ostomy takedown, spleenic flexure mobilization, excision rectal stump/distal sigmoid, anastomosis with suture reinforcement;  Surgeon: [name], MD;  Location: [name];  Service: General;  Laterality: N/A;  . ILEO LOOP DIVERSION N/A 02/24/2017   Procedure: ILEO LOOP COLOSTOMY;  Surgeon: Adonis Huguenin,  Juanda Crumble, MD;  Location: [name];  Service: General;  Laterality: N/A;  . ILEOSTOMY CLOSURE N/A 06/01/2017   Procedure: LOOP ILEOSTOMY TAKEDOWN;  Surgeon: [name], MD;  Location: [name];  Service: General;  Laterality: N/A;  . IVC FILTER INSERTION Right 10/2016  . IVC FILTER REMOVAL N/A 08/09/2017   Procedure: IVC FILTER REMOVAL;  Surgeon: Katha Cabal, MD;  Location: [place] CV [facility];  Service: Cardiovascular;  Laterality: N/A;  . KNEE SURGERY     torn menicus  .  LAPAROSCOPY N/A 02/24/2017   Procedure: LAPAROSCOPY DIAGNOSTIC;  Surgeon: [name], MD;  Location: [name];  Service: General;  Laterality: N/A;  . LYSIS OF ADHESION N/A 02/24/2017   Procedure: LYSIS OF ADHESION;  Surgeon: [name], MD;  Location: [name];  Service: General;  Laterality: N/A;  . PULMONARY VENOGRAPHY N/A 11/19/2016   Procedure: Pulmonary Venography; IVC filter placement; possible pulmonary thrombectomy;  Surgeon: Katha Cabal, MD;  Location: [place] [facility];  Service: Cardiovascular;  Laterality: N/A;  . SACROPLASTY N/A 11/08/2017   Procedure: SACROPLASTY S2;  Surgeon: Hessie Knows, MD;  Location: [name];  Service: Orthopedics;  Laterality: N/A;  . SIGMOIDOSCOPY N/A 11/13/2016   Procedure: endoscopic  flexible SIGMOIDOSCOPY;  Surgeon: Florene Glen, MD;  Location: [name];  Service: General;  Laterality: N/A;  . SIGMOIDOSCOPY N/A 05/19/2017   Procedure: Beryle Quant;  Surgeon: [name], MD;  Location: [name];  Service: General;  Laterality: N/A;   Patient Active Problem List   Diagnosis Date Noted  . Latex precautions, history of latex allergy 06/24/2020  . Hyperglycemia 06/24/2020  . Uncomplicated opioid dependence ([place]) 06/15/2020  . Spasm of muscle of lower back 01/29/2020  . Abnormal MRI, lumbar spine (08/09/2019) 08/14/2019  . Spondylolisthesis, lumbosacral region 07/26/2019  . 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  . Acute postoperative pain 07/10/2019  . Chronic sacroiliac joint pain (Left) 02/28/2019  . Preoperative testing 10/23/2018  . Small bowel obstruction due to adhesions ([place]) 08/24/2018  . Coccygodynia 05/09/2018  . Acute deep vein thrombosis (DVT) of distal end of right lower extremity ([place]) 05/08/2018  . DDD (degenerative disc disease), lumbosacral 04/11/2018  . Chronic anticoagulation (COUMADIN) 04/11/2018  . Morbid obesity with BMI of 40.0-44.9, adult ([place]) 03/15/2018  . Secondary osteoarthritis  of multiple sites 03/15/2018  . Sacral insufficiency fracture 03/15/2018  . Chronic low back pain (1ry area of Pain) (Bilateral) w/o sciatica 12/14/2017  . Spondylosis without myelopathy or radiculopathy, lumbosacral region 12/14/2017  . Other specified dorsopathies, sacral and sacrococcygeal region 12/14/2017  . History of allergy to latex 11/09/2017  . Hyponatremia 11/02/2017  . Hypocalcemia 11/02/2017  . Grade 1-2 Anterolisthesis of L4/L5 & L5/S1 11/02/2017  . Lumbar foraminal stenosis (L3-4 and L4-5) (Bilateral) 11/02/2017  . Lumbosacral L5-S1 subarticular lateral recess stenosis (Bilateral) 11/02/2017  . Lumbar facet arthropathy (L3-4, L4-5, and L5-S1) (Bilateral) 11/02/2017  . Lumbar facet syndrome (Bilateral) 11/02/2017  . 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 Lumbar pars defect (L3 and L4) (Bilateral) 11/02/2017  . Diabetic peripheral neuropathy ([place]) 11/02/2017  . Chronic lower extremity pain (2ry area of Pain) (Bilateral) 11/02/2017  . Chronic radicular pain of lower extremity 11/02/2017  . Osteoarthritis of hip (Right) 11/02/2017  . Chronic low back pain (1ry area of Pain) (Bilateral) w/ sciatica (Bilateral) 10/13/2017  . Chronic sacroiliac joint pain (Right) 10/13/2017  . Chronic pain syndrome 10/13/2017  . Long term current use of opiate analgesic 10/13/2017  . Pharmacologic  therapy 10/13/2017  . Disorder of skeletal system 10/13/2017  . Problems influencing health status 10/13/2017  . Diverticulosis of colon 06/20/2017  . History of pulmonary embolism 06/20/2017  . S/P IVC filter 06/20/2017  . H/O ileostomy 06/01/2017  . Positive result for methicillin resistant Staphylococcus aureus (MRSA) screening 05/11/2017  . Ileostomy in place [facility]) 04/08/2017  . History of colostomy reversal 02/24/2017  . Other pulmonary embolism without acute cor pulmonale ([place]) 11/23/2016  . Acute on chronic systolic (congestive) heart failure ([place])  11/18/2016  . HCAP (healthcare-associated pneumonia) 11/18/2016  . Encounter for removal of staples   . Edema of both legs 11/15/2016  . Severe obesity (BMI 35.0-39.9) with comorbidity ([place]) 11/15/2016  . Diverticulitis 10/03/2016  . Diverticulitis large intestine 09/29/2016  . Benign essential hypertension 08/26/2016  . DDD (degenerative disc disease), lumbar 07/14/2016  . Acute non-recurrent maxillary sinusitis 05/21/2016  . Chronic hip pain (Left) 02/23/2016  . Cough 07/30/2015  . Nocturnal leg cramps 02/20/2015  . Diabetes mellitus type 2 in obese ([place]) 11/12/2014  . Type 2 diabetes mellitus with hyperglycemia, without long-term current use of insulin ([place]) 11/12/2014  . BP (high blood pressure) 11/12/2014      Prior to Admission medications   Medication Sig Start Date End Date Taking? Authorizing Provider  ondansetron (ZOFRAN ODT) 4 MG disintegrating tablet Take 1 tablet (4 mg total) by mouth every 8 (eight) hours as needed for nausea or vomiting. 07/03/20  Yes Merlyn Lot, MD  aspirin EC 81 MG tablet Take 81 mg by mouth daily. am    [provider]  calcium carbonate (CALCIUM 600) 600 MG TABS tablet Take 1 tablet (600 mg total) by mouth 2 (two) times daily with a meal. 02/25/20 08/23/20  Milinda Pointer, MD  Calcium Polycarbophil (FIBER-CAPS PO) Take by mouth. am    [provider]  candesartan (ATACAND) 16 MG tablet Take 16 mg by mouth daily. am 08/03/17   [provider]  Cholecalciferol (VITAMIN D3) 25 MCG (1000 UT) CAPS Take by mouth daily.    [provider]  cyclobenzaprine (FLEXERIL) 10 MG tablet Take 10 mg by mouth at bedtime. 07/05/16   [provider]  Dulaglutide (TRULICITY [place]) Inject into the skin.    [provider]  gabapentin (NEURONTIN) 100 MG capsule Take 100 mg by mouth 3 (three) times daily. Am,dinner,bedtime    [provider]  gabapentin (NEURONTIN) 300 MG capsule Take 300 mg by mouth at  bedtime.    [provider]  glipiZIDE (GLUCOTROL XL) 10 MG 24 hr tablet Take 10 mg by mouth daily. 01/31/20   [provider]  glipiZIDE (GLUCOTROL XL) 5 MG 24 hr tablet Take 15 mg by mouth daily with breakfast.  10/27/16   [provider]  HYDROcodone-acetaminophen (NORCO/VICODIN) 5-325 MG tablet Take 1 tablet by mouth at bedtime. Must last 30 days. 06/18/20 07/18/20  Milinda Pointer, MD  meclizine (ANTIVERT) 25 MG tablet Take 25 mg by mouth daily. am 12/29/17   [provider]  Multiple Vitamin (MULTI-VITAMINS) TABS Take 1 tablet by mouth daily.    [provider]  nystatin cream (MYCOSTATIN) Apply 1 application topically 3 (three) times daily. 09/06/19   [provider]  rOPINIRole (REQUIP) 0.5 MG tablet Take 0.5 mg by mouth at bedtime.    [provider]  tolterodine (DETROL) 2 MG tablet Take 2 mg by mouth 2 (two) times daily. 11/24/19   [provider]  traMADol (ULTRAM) 50 MG tablet Take 1  tablet (50 mg total) by mouth every 8 (eight) hours as needed for severe pain. Must last 30 days 05/03/20 08/01/20  Milinda Pointer, MD  vitamin B-12 (CYANOCOBALAMIN) 500 MCG tablet Take 500 mcg by mouth.    [provider]  warfarin (COUMADIN) 1 MG tablet Take 1 mg by mouth daily. am 10/17/18   [provider]  warfarin (COUMADIN) 6 MG tablet Take 6 mg by mouth daily.     [provider]    Allergies Adhesive [tape], Other, Latex, and Morphine and related    Social History Social History   Tobacco Use  . Smoking status: Never Smoker  . Smokeless tobacco: Never Used  Vaping Use  . Vaping Use: Never used  Substance Use Topics  . Alcohol use: No    Alcohol/week: 0.0 standard drinks  . Drug use: No    Review of Systems Patient denies headaches, rhinorrhea, blurry vision, numbness, shortness of breath, chest pain, edema, cough, abdominal pain, nausea, vomiting, diarrhea, dysuria, fevers, rashes or  hallucinations unless otherwise stated above in HPI. ____________________________________________   PHYSICAL EXAM:  VITAL SIGNS: Vitals:   07/03/20 1100 07/03/20 1243  BP: 109/82 (!) 117/51  Pulse: 88 86  Resp: (!) 21 19  Temp:    SpO2: 95% 97%    Constitutional: Alert and oriented.  Eyes: Conjunctivae are normal.  Head: Atraumatic. Nose: No congestion/rhinnorhea. Mouth/Throat: Mucous membranes are moist.   Neck: No stridor. Painless ROM.  Cardiovascular: Normal rate, regular rhythm. Grossly normal heart sounds.  Good peripheral circulation. Respiratory: Normal respiratory effort.  No retractions. Lungs CTAB. Gastrointestinal: Soft and nontender. No distention. No abdominal bruits. No CVA tenderness. Genitourinary:  Musculoskeletal: No lower extremity tenderness nor edema.  No joint effusions. Neurologic:  Normal speech and language. No gross focal neurologic deficits are appreciated. No facial droop Skin:  Skin is warm, dry and intact. No rash noted. Psychiatric: Mood and affect are normal. Speech and behavior are normal.  ____________________________________________   LABS (all labs ordered are listed, but only abnormal results are displayed)  Results for orders placed or performed during the hospital encounter of 07/03/20 (from the past 24 hour(s))  CBC with Differential     Status: Abnormal   Collection Time: 07/03/20  8:41 AM  Result Value Ref Range   WBC 8.6 4.0 - 10.5 K/uL   RBC 4.69 3.87 - 5.11 MIL/uL   Hemoglobin 13.7 12.0 - 15.0 g/dL   HCT 42.6 36.0 - 46.0 %   MCV 90.8 80.0 - 100.0 fL   MCH 29.2 26.0 - 34.0 pg   MCHC 32.2 30.0 - 36.0 g/dL   RDW 13.8 11.5 - 15.5 %   Platelets 200 150 - 400 K/uL   nRBC 0.0 0.0 - 0.2 %   Neutrophils Relative % 90 %   Neutro Abs 7.8 (H) 1.7 - 7.7 K/uL   Lymphocytes Relative 3 %   Lymphs Abs 0.2 (L) 0.7 - 4.0 K/uL   Monocytes Relative 6 %   Monocytes Absolute 0.5 0.1 - 1.0 K/uL   Eosinophils Relative 1 %   Eosinophils  Absolute 0.1 0.0 - 0.5 K/uL   Basophils Relative 0 %   Basophils Absolute 0.0 0.0 - 0.1 K/uL   Immature Granulocytes 0 %   Abs Immature Granulocytes 0.03 0.00 - 0.07 K/uL  Comprehensive metabolic panel     Status: Abnormal   Collection Time: 07/03/20  8:41 AM  Result Value Ref Range   Sodium 138 135 - 145 mmol/L  Potassium 3.8 3.5 - 5.1 mmol/L   Chloride 106 98 - 111 mmol/L   CO2 21 (L) 22 - 32 mmol/L   Glucose, Bld 325 (H) 70 - 99 mg/dL   BUN 23 8 - 23 mg/dL   Creatinine, Ser 0.84 0.44 - 1.00 mg/dL   Calcium 8.2 (L) 8.9 - 10.3 mg/dL   Total Protein 6.3 (L) 6.5 - 8.1 g/dL   Albumin 3.3 (L) 3.5 - 5.0 g/dL   AST 23 15 - 41 U/L   ALT 31 0 - 44 U/L   Alkaline Phosphatase 41 38 - 126 U/L   Total Bilirubin 0.9 0.3 - 1.2 mg/dL   GFR, Estimated >60 >60 mL/min   Anion gap 11 5 - 15  Lipase, blood     Status: None   Collection Time: 07/03/20  8:41 AM  Result Value Ref Range   Lipase 34 11 - 51 U/L   ____________________________________________  EKG My review and personal interpretation at Time: 14:19   Indication: nausea  Rate: 85  Rhythm: sinus Axis: normal Other: normal intervals, no stemi ____________________________________________  RADIOLOGY  I personally reviewed all radiographic images ordered to evaluate for the above acute complaints and reviewed radiology reports and findings.  These findings were personally discussed with the patient.  Please see medical record for radiology report.  ____________________________________________   PROCEDURES  Procedure(s) performed:  Procedures    Critical Care performed: no ____________________________________________   INITIAL IMPRESSION / ASSESSMENT AND PLAN / ED COURSE  Pertinent labs & imaging results that were available during my care of the patient were reviewed by me and considered in my medical decision making (see chart for details).   DDX: Enteritis, gastritis, SBO, diverticulitis, perforation  HOLLI RENGEL is a 70 y.o. who presents to the ED with presentation as described above.  Patient nontoxic-appearing suspect probable food poisoning given her age history will order CT imaging as she does have nausea vomiting decreased p.o. intake concern for possible obstruction.  Will give IV fluids as well as IV medication.  The patient will be placed on continuous pulse oximetry and telemetry for monitoring.  Laboratory evaluation will be sent to evaluate for the above complaints.     Clinical Course as of 07/03/20 1441  Thu Jul 03, 2020  1302 Patient reassessed.  She feels better.  Query whether she simply discussed some form of food poisoning.  Would like to try something for p.o. now we know there is no obstructive process or acute intra-abdominal process. [PR]  1408 Patient reassessed.  Feels improved.  She is tolerating p.o.  She refused Covid PCR test.  Her rapid was negative.  Is not had any diarrheal illness.  No signs or findings of colitis on imaging.  As she is tolerating p.o. she does appear appropriate for outpatient follow-up.  We discussed strict return precautions.  Patient agreeable to plan. [PR]    Clinical Course User Index [PR] Merlyn Lot, MD    The patient was evaluated in Emergency Department today for the symptoms described in the history of present illness. He/she was evaluated in the context of the global COVID-19 pandemic, which necessitated consideration that the patient might be at risk for infection with the SARS-CoV-2 virus that causes COVID-19. Institutional protocols and algorithms that pertain to the evaluation of patients at risk for COVID-19 are in a state of rapid change based on information released by regulatory bodies including the CDC and federal and state organizations. These policies and algorithms  were followed during the patient's care in the ED.  As part of my medical decision making, I reviewed the following data within the electronic medical  record:  Nursing notes reviewed and incorporated, Labs reviewed, notes from prior ED visits and [place] Controlled Substance Database   ____________________________________________   FINAL CLINICAL IMPRESSION(S) / ED DIAGNOSES  Final diagnoses:  Nausea vomiting and diarrhea      NEW MEDICATIONS STARTED DURING THIS VISIT:  New Prescriptions   ONDANSETRON (ZOFRAN ODT) 4 MG DISINTEGRATING TABLET    Take 1 tablet (4 mg total) by mouth every 8 (eight) hours as needed for nausea or vomiting.     Note:  This document was prepared using Dragon voice recognition software and may include unintentional dictation errors.    Merlyn Lot, MD 07/03/20 [phone]

## 2020-07-03 NOTE — ED Notes (Signed)
Pt refuses D/C vitals at this time. NAD noted. IV removed.

## 2020-07-03 NOTE — ED Notes (Signed)
Pt presents to ED with c/o of having N/V/D that started last night. Pt states eating pasta last night and the N/V/D that started at 0200 his morning. Pt states vomiting x4 and 5 bouts of diarrhea. Pt states recently taking ABX for UTI, pt states she stopped taking them last Thursday. Pt denies blood in emesis or stool. Pt denies fever or chills. Pt is A&O4. NAD at this time. VSS.

## 2020-07-03 NOTE — ED Notes (Signed)
D/C and new RX discussed with pt, pt verbalized understanding. NAD noted.

## 2020-07-03 NOTE — ED Triage Notes (Signed)
Pt arrives via EMS from home for N/V/D.

## 2020-07-03 NOTE — ED Notes (Signed)
Pt reports she checks CBG at home, has been normal. Last checked was 136 at home yesterday morning

## 2020-07-03 NOTE — ED Notes (Signed)
Pt provided with another cup of ice per request. Given update on care plan

## 2020-07-03 NOTE — ED Notes (Signed)
Showed patient where her call bell was that we had on the bed for her. Cleaned patient up and placed pure wick on patient. She is resting and trying to sleep at this point.

## 2020-07-03 NOTE — ED Notes (Signed)
Pt refuses COVID PCR, MD aware. Pt states "one time is enough"

## 2020-07-03 NOTE — ED Notes (Signed)
Purewick repositioned. Pt denies any further needs and states nausea is "better". Pt requests food, pt educated we need to wait until CT results are back, pt verbalized understanding. Call bell in reach.

## 2020-07-07 ENCOUNTER — Other Ambulatory Visit: Payer: Self-pay | Admitting: Pain Medicine

## 2020-07-07 DIAGNOSIS — G894 Chronic pain syndrome: Secondary | ICD-10-CM

## 2020-07-09 ENCOUNTER — Ambulatory Visit: Payer: PPO | Admitting: Pain Medicine

## 2020-07-13 NOTE — Progress Notes (Signed)
PROVIDER NOTE: Information contained herein reflects review and annotations entered in association with encounter. Interpretation of such information and data should be left to medically-trained personnel. Information provided to patient can be located elsewhere in the medical record under "Patient Instructions". Document created using STT-dictation technology, any transcriptional errors that may result from process are unintentional.    Patient: Kara [name]  Service Category: E/M  Provider: Gaspar Cola, MD  DOB: 12/21/1950  DOS: 07/14/2020  Specialty: Interventional Pain Management  MRN: [id number]  Setting: Ambulatory outpatient  PCP: Tracie Harrier, MD  Type: Established Patient    Referring Provider: Tracie Harrier, MD  Location: Office  Delivery: Face-to-face     HPI  [name], a 70 y.o. year old female, is here today because of her Chronic pain syndrome [G89.4]. Ms. Welborn primary complain today is Back Pain (lower) Last encounter: My last encounter with her was on 07/07/2020. Pertinent problems: Ms. 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); Lumbar pars defect (L3 and L4) (Bilateral); Diabetic peripheral neuropathy ([place]); Chronic lower extremity pain (2ry area of Pain) (Bilateral); Chronic radicular pain of lower extremity; Osteoarthritis of hip (Right); Chronic low back pain (1ry area of Pain) (Bilateral)  w/o sciatica; Spondylosis without myelopathy or radiculopathy, lumbosacral region; Other specified dorsopathies, sacral and sacrococcygeal region; Secondary osteoarthritis of multiple sites; Sacral insufficiency fracture; DDD (degenerative disc disease), lumbosacral; Coccygodynia; Chronic sacroiliac joint pain (Left); Osteoarthritis of lumbar spine; Acute postoperative pain; Spondylolisthesis, lumbosacral region; Abnormal MRI, lumbar spine (08/09/2019); and Spasm of muscle of lower back on their pertinent problem list. Pain Assessment: Severity of Chronic pain is reported as a 3 /10. Location: Back Lower/denies. Onset: More than a month ago. Quality: Dull,Sharp. Timing: Intermittent. Modifying factor(s): lying down. Vitals:  height is '5\' 3"'  (1.6 m) and weight is 209 lb (94.8 kg). Her temperature is 97.2 F (36.2 C) (abnormal). Her blood pressure is 154/80 (abnormal) and her pulse is 89. Her respiration is 18 and oxygen saturation is 97%.   Reason for encounter: both, medication management and post-procedure assessment.  The patient indicates again having obtained excellent benefit from the palliative injection.  She is aware that the solution to the problem is to bring her BMI down to thirty, but today she has indicated that she rather not continue to be reminded of that. The patient indicates doing well with the current medication regimen. No adverse reactions or side effects reported to the medications.   Post-Procedure Evaluation  Procedure (07/07/2020): Palliative bilateral lumbar facet block #L14/R18 under fluoroscopic guidance and IV sedation Pre-procedure pain level: 10/10 Post-procedure: 3/10 (> 50% relief)  Sedation: Sedation provided.  Effectiveness during initial hour after procedure(Ultra-Short Term Relief): 80 %.  Local anesthetic used: Long-acting (4-6 hours) Effectiveness: Defined as any analgesic benefit obtained secondary to the administration of local anesthetics. This carries  significant diagnostic value as to the etiological location, or anatomical origin, of the pain. Duration of benefit is expected to coincide with the duration of the local anesthetic used.  Effectiveness during initial 4-6 hours after procedure(Short-Term Relief): 85 %.  Long-term benefit: Defined as any relief past the pharmacologic duration of the local anesthetics.  Effectiveness past the initial 6 hours after procedure(Long-Term Relief): 95 %.  Current benefits: Defined as benefit that persist at this time.   Analgesia:  >75% relief Function: Ms. Blazina reports improvement in function ROM: Ms. Chappelle reports improvement in ROM  Pharmacotherapy Assessment   Analgesic: Hydrocodone/APAP 5/325, 1 tab PO QD PRN (5 mg/day of hydrocodone) MME: 5 mg/day.   Monitoring: [place] PMP: PDMP reviewed during this encounter.       Pharmacotherapy: No side-effects or adverse reactions reported. Compliance: No problems identified. Effectiveness: Clinically acceptable.  Landis Martins, RN  07/14/2020  9:35 AM  Sign when Signing Visit Nursing Pain Medication Assessment:  Safety precautions to be maintained throughout the outpatient stay will include: orient to surroundings, keep bed in low position, maintain call bell within reach at all times, provide assistance with transfer out of bed and ambulation.  Medication Inspection Compliance: Pill count conducted under aseptic conditions, in front of the patient. Neither the pills nor the bottle was removed from the patient's sight at any time. Once count was completed pills were immediately returned to the patient in their original bottle.  Medication: Hydrocodone/APAP Pill/Patch Count: 14 of 30 pills remain Pill/Patch Appearance: Markings consistent with prescribed medication Bottle Appearance: Standard pharmacy container. Clearly labeled. Filled Date: 06/18/2020  Last Medication intake:  Yesterday   Does not need new prescription for  Tramadol.    UDS:  Summary  Date Value Ref Range Status  09/20/2019 Note  Final    Comment:    ==================================================================== [facility] (MW) ==================================================================== Test                             Result       Flag       Units Drug Absent but Declared for Prescription Verification   Hydrocodone                    Not Detected UNEXPECTED ng/mg creat ==================================================================== Test                      Result    Flag   Units      Ref Range   Creatinine              126              mg/dL      >=20 ==================================================================== Declared Medications:  The flagging and interpretation on this report are based on the  following declared medications.  Unexpected results may arise from  inaccuracies in the declared medications.  **Note: The testing scope of this panel includes these medications:  Hydrocodone  **Note: The testing scope of this panel does not include the  following reported medications:  Acetaminophen  Aspirin  Calcium  Candesartan  Cholecalciferol  Cyanocobalamin  Cyclobenzaprine  Dulaglutide  Gabapentin  Glipizide  Loperamide  Meclizine  Multivitamin  Nystatin  Ropinirole  Warfarin ==================================================================== For clinical consultation, please call [phone]. ====================================================================      ROS  Constitutional: Denies any fever or chills Gastrointestinal: No reported hemesis, hematochezia, vomiting, or acute GI distress Musculoskeletal: Denies any acute onset joint swelling, redness, loss of ROM, or weakness Neurological: No reported episodes of acute onset apraxia, aphasia, dysarthria, agnosia, amnesia, paralysis, loss of coordination, or loss of consciousness  Medication Review  Calcium  Polycarbophil, Dulaglutide, HYDROcodone-acetaminophen,  Multi-Vitamins, Vitamin D3, aspirin EC, calcium carbonate, candesartan, cyclobenzaprine, gabapentin, glipiZIDE, meclizine, nystatin cream, ondansetron, rOPINIRole, tolterodine, traMADol, vitamin B-12, and warfarin  History Review  Allergy: Ms. Winger is allergic to adhesive [tape], other, latex, and morphine and related. Drug: Ms. Fofana  reports no history of drug use. Alcohol:  reports no history of alcohol use. Tobacco:  reports that she has never smoked. She has never used smokeless tobacco. Social: Ms. Wardrop  reports that she has never smoked. She has never used smokeless tobacco. She reports that she does not drink alcohol and does not use drugs. 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 Surgical: Ms. Garno  has a past surgical history that includes Knee surgery; Carpal tunnel release (Bilateral); Colon resection sigmoid (N/A, 11/02/2016); Colostomy (N/A, 11/02/2016); Sigmoidoscopy (N/A, 11/13/2016); PULMONARY VENOGRAPHY (N/A, 11/19/2016); Colonoscopy with propofol (N/A, 02/03/2017); Colon surgery (11/02/2016); IVC FILTER INSERTION (Right, 10/2016); Colostomy reversal (N/A, 02/24/2017); Appendectomy (N/A, 02/24/2017); Lysis of adhesion (N/A, 02/24/2017); laparoscopy (N/A, 02/24/2017); I[place]o loop diversion (N/A, 02/24/2017); Sigmoidoscopy (N/A, 05/19/2017); Ileostomy closure (N/A, 06/01/2017); IVC FILTER REMOVAL (N/A, 08/09/2017); Sacroplasty (N/A, 11/08/2017); Cataract extraction w/PHACO (Left, 11/21/2018); and Cataract extraction w/PHACO (Right, 12/12/2018). Family: family history includes Breast cancer in her mother; Cancer in her mother; Diabetes in  her mother and sister; Heart disease in her father, mother, and sister.  Laboratory Chemistry Profile   Renal Lab Results  Component Value Date   BUN 23 07/03/2020   CREATININE 0.84 07/03/2020   GFRAA >60 07/09/2019   GFRNONAA >60 07/03/2020     Hepatic Lab Results  Component Value Date   AST 23 07/03/2020   ALT 31 07/03/2020   ALBUMIN 3.3 (L) 07/03/2020   ALKPHOS 41 07/03/2020   LIPASE 34 07/03/2020     Electrolytes Lab Results  Component Value Date   NA 138 07/03/2020   K 3.8 07/03/2020   CL 106 07/03/2020   CALCIUM 8.2 (L) 07/03/2020   MG 2.0 11/02/2017   PHOS 3.3 11/07/2016     Bone Lab Results  Component Value Date   25OHVITD1 25 (L) 11/02/2017   25OHVITD2 <1.0 11/02/2017   25OHVITD3 25 11/02/2017     Inflammation (CRP: Acute Phase) (ESR: Chronic Phase) Lab Results  Component Value Date   CRP 5.3 (H) 11/02/2017   ESRSEDRATE 12 11/02/2017       Note: Above Lab results reviewed.  Recent Imaging Review  CT ABDOMEN PELVIS W CONTRAST CLINICAL DATA:  Nausea, vomiting, diarrhea, unspecified abdominal pain  EXAM: CT ABDOMEN AND PELVIS WITH CONTRAST  TECHNIQUE: Multidetector CT imaging of the abdomen and pelvis was performed using the standard protocol following bolus administration of intravenous contrast.  CONTRAST:  125m OMNIPAQUE IOHEXOL 300 MG/ML  SOLN  COMPARISON:  08/24/2018  FINDINGS: Lower chest: The visualized lung bases are clear. The visualized heart and pericardium are unremarkable.  Hepatobiliary: Mild hepatic steatosis. No enhancing liver lesion. No intra or extrahepatic biliary ductal dilation. Cholelithiasis noted without pericholecystic inflammatory change.  Pancreas: Unremarkable  Spleen: Unremarkable  Adrenals/Urinary Tract: Adrenal glands are unremarkable. Kidneys are normal, without renal calculi, focal lesion, or hydronephrosis. Bladder is unremarkable.  Stomach/Bowel: Surgical changes of a partial small bowel  resection and sigmoid colectomy are identified. The appendix is not visualized and may be absent. Moderate broad-base ventral hernia is present containing and  unremarkable loop of transverse colon. The stomach, small bowel, and large bowel are otherwise unremarkable. No free intraperitoneal gas or fluid.  Vascular/Lymphatic: Mild atherosclerotic calcification within the abdominal aorta. No aortic aneurysm. No pathologic adenopathy within the abdomen and pelvis.  Reproductive: Uterus and bilateral adnexa are unremarkable.  Other: Rectum unremarkable  Musculoskeletal: No acute bone abnormality. Advanced degenerative changes are seen within the lumbar spine with vacuum disc phenomena at L4-5 and L5-S1 and grade 1 anterolisthesis at L4-5. Bilateral sacroplasty has likely been performed with methylmethacrylate extending into the sacroiliac joint spaces bilaterally.  IMPRESSION: No acute intra-abdominal pathology identified. No radiographic explanation for the patient's reported symptoms.  Mild hepatic steatosis.  Advanced degenerative changes within the lower lumbar spine.  Aortic Atherosclerosis (ICD10-I70.0).  Electronically Signed   By: Fidela Salisbury MD   On: 07/03/2020 12:35 DG Chest Portable 1 View CLINICAL DATA:  Nausea and vomiting  EXAM: PORTABLE CHEST 1 VIEW  COMPARISON:  02/27/2017  FINDINGS: Cardiac shadow is stable. Aortic calcifications are noted. The lungs are clear. No bony abnormality is noted.  IMPRESSION: No acute abnormality noted.  Electronically Signed   By: Inez Catalina M.D.   On: 07/03/2020 09:01 Note: Reviewed        Physical Exam  General appearance: Well nourished, well developed, and well hydrated. In no apparent acute distress Mental status: Alert, oriented x 3 (person, place, & time)       Respiratory: No evidence of acute respiratory distress Eyes: PERLA Vitals: BP (!) 154/80 (BP Location: Right Arm, Patient Position: Sitting, Cuff  Size: Normal)   Pulse 89   Temp (!) 97.2 F (36.2 C)   Resp 18   Ht '5\' 3"'  (1.6 m)   Wt 209 lb (94.8 kg)   SpO2 97%   BMI 37.02 kg/m  BMI: Estimated body mass index is 37.02 kg/m as calculated from the following:   Height as of this encounter: '5\' 3"'  (1.6 m).   Weight as of this encounter: 209 lb (94.8 kg). Ideal: Ideal body weight: 52.4 kg (115 lb 8.3 oz) Adjusted ideal body weight: 69.4 kg (152 lb 14.6 oz)  Assessment   Status Diagnosis  Controlled Controlled Controlled 1. Chronic pain syndrome   2. Lumbar facet syndrome (Bilateral)   3. Lumbar facet arthropathy (L3-4, L4-5, and L5-S1) (Bilateral)   4. 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 DDD (degenerative disc disease), lumbosacral   8. Chronic low back pain (1ry area of Pain) (Bilateral) w/o sciatica   9. Severe obesity (BMI 35.0-39.9) with comorbidity ([place])      Updated Problems: No problems updated.  Plan of Care  Problem-specific:  No problem-specific Assessment & Plan notes found for this encounter.  Ms. ALTHEA BACKS has a current medication list which includes the following long-term medication(s): calcium carbonate, candesartan, glipizide, glipizide, ropinirole, warfarin, [START ON 07/18/2020] hydrocodone-acetaminophen, [START ON 08/17/2020] hydrocodone-acetaminophen, [START ON 09/16/2020] hydrocodone-acetaminophen, [START ON 08/01/2020] tramadol, and warfarin.  Pharmacotherapy (Medications Ordered): Meds ordered this encounter  Medications  . traMADol (ULTRAM) 50 MG tablet    Sig: Take 1 tablet (50 mg total) by mouth every 8 (eight) hours as needed for severe pain. Must last 30 days    Dispense:  90 tablet    Refill:  2    Chronic Pain: STOP Act (Not applicable) Fill 1 day early if closed on  refill date. Avoid benzodiazepines within 8 hours of opioids  . HYDROcodone-acetaminophen  (NORCO/VICODIN) 5-325 MG tablet    Sig: Take 1 tablet by mouth at bedtime. Must last 30 days.    Dispense:  30 tablet    Refill:  0    Chronic Pain: STOP Act (Not applicable) Fill 1 day early if closed on refill date. Avoid benzodiazepines within 8 hours of opioids  . HYDROcodone-acetaminophen (NORCO/VICODIN) 5-325 MG tablet    Sig: Take 1 tablet by mouth at bedtime. Must last 30 days.    Dispense:  30 tablet    Refill:  0    Chronic Pain: STOP Act (Not applicable) Fill 1 day early if closed on refill date. Avoid benzodiazepines within 8 hours of opioids  . HYDROcodone-acetaminophen (NORCO/VICODIN) 5-325 MG tablet    Sig: Take 1 tablet by mouth at bedtime. Must last 30 days.    Dispense:  30 tablet    Refill:  0    Chronic Pain: STOP Act (Not applicable) Fill 1 day early if closed on refill date. Avoid benzodiazepines within 8 hours of opioids   Orders:  No orders of the defined types were placed in this encounter.  Follow-up plan:   Return in about 3 months (around 10/16/2020) for (F2F), (Med Mgmt).      Interventional Therapies  Risk  Complexity  Complicating Factors:   1.  Coumadin anticoagulation (Stop:5days  Re-start: 2hrs) 2.  Latex allergy  3.  Morbid obesity (class III) (BMI>40)  4.  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   Completed:   Palliative/Therapeutic left L4-5 LESI x1(11/10/2017) Palliative left lumbar facet blockx14(06/24/2020) Palliativeright lumbar facet block(s) x18(06/24/2020) Palliative right SI joint block x8(not as effective as facet Blk) (03/08/2019) Diagnostic left SI joint block x1 (not as effective as facet Blk) (03/08/2019) Palliative/Therapeutic (Midline) caudalESIx3(06/08/2018)   Palliative options:   Palliative/Therapeutic left L4-5 LESI #2 Palliative left lumbar facet block#15 Palliativeright lumbar facet block(s)  #19 Palliative right SI joint block #9 Diagnostic left SI joint block #2  Palliative/Therapeutic (Midline) caudalESI#4    Recent Visits Date Type Provider Dept  06/24/20 Procedure visit Milinda Pointer, MD [name]  06/18/20 Telemedicine Milinda Pointer, MD [name]  04/29/20 Procedure visit Milinda Pointer, MD [name]  04/23/20 Telemedicine Milinda Pointer, MD [name]  Showing recent visits within past 90 days and meeting all other requirements Today's Visits Date Type Provider Dept  07/14/20 Office Visit Milinda Pointer, MD [name]  Showing today's visits and meeting all other requirements Future Appointments No visits were found meeting these conditions. Showing future appointments within next 90 days and meeting all other requirements  I discussed the assessment and treatment plan with the patient. The patient was provided an opportunity to ask questions and all were answered. The patient agreed with the plan and demonstrated an understanding of the instructions.  Patient advised to call back or seek an in-person evaluation if the symptoms or condition worsens.  Duration of encounter: 30 minutes.  Note by: Gaspar Cola, MD Date: 07/14/2020; Time: 10:36 AM

## 2020-07-14 ENCOUNTER — Ambulatory Visit: Payer: PPO | Attending: Pain Medicine | Admitting: Pain Medicine

## 2020-07-14 ENCOUNTER — Other Ambulatory Visit: Payer: Self-pay

## 2020-07-14 ENCOUNTER — Encounter: Payer: Self-pay | Admitting: Pain Medicine

## 2020-07-14 VITALS — BP 154/80 | HR 89 | Temp 97.2°F | Resp 18 | Ht 63.0 in | Wt 209.0 lb

## 2020-07-14 DIAGNOSIS — G8929 Other chronic pain: Secondary | ICD-10-CM

## 2020-07-14 DIAGNOSIS — M47816 Spondylosis without myelopathy or radiculopathy, lumbar region: Secondary | ICD-10-CM

## 2020-07-14 DIAGNOSIS — M4300 Spondylolysis, site unspecified: Secondary | ICD-10-CM | POA: Diagnosis not present

## 2020-07-14 DIAGNOSIS — M545 Low back pain, unspecified: Secondary | ICD-10-CM | POA: Diagnosis not present

## 2020-07-14 DIAGNOSIS — G894 Chronic pain syndrome: Secondary | ICD-10-CM | POA: Diagnosis not present

## 2020-07-14 DIAGNOSIS — M4306 Spondylolysis, lumbar region: Secondary | ICD-10-CM

## 2020-07-14 DIAGNOSIS — M5137 Other intervertebral disc degeneration, lumbosacral region: Secondary | ICD-10-CM

## 2020-07-14 DIAGNOSIS — M4310 Spondylolisthesis, site unspecified: Secondary | ICD-10-CM | POA: Diagnosis not present

## 2020-07-14 MED ORDER — HYDROCODONE-ACETAMINOPHEN 5-325 MG PO TABS: 1.0000 | ORAL_TABLET | Freq: Every day | ORAL | 0 refills | Status: DC

## 2020-07-14 MED ORDER — TRAMADOL HCL 50 MG PO TABS: 50.0000 mg | ORAL_TABLET | Freq: Three times a day (TID) | ORAL | 2 refills | Status: DC | PRN

## 2020-07-14 NOTE — Patient Instructions (Signed)
____________________________________________________________________________________________  Medication Rules  Purpose: To inform patients, and their family members, of our rules and regulations.  Applies to: All patients receiving prescriptions (written or electronic).  Pharmacy of record: Pharmacy where electronic prescriptions will be sent. If written prescriptions are taken to a different pharmacy, please inform the nursing staff. The pharmacy listed in the electronic medical record should be the one where you would like electronic prescriptions to be sent.  Electronic prescriptions: In compliance with the [place] (STOP) Act of 2017 (Session Lanny Cramp [phone]), effective May 24, 2018, all controlled substances must be electronically prescribed. Calling prescriptions to the pharmacy will cease to exist.  Prescription refills: Only during scheduled appointments. Applies to all prescriptions.  NOTE: The following applies primarily to controlled substances (Opioid* Pain Medications).   Type of encounter (visit): For patients receiving controlled substances, face-to-face visits are required. (Not an option or up to the patient.)  Patient's responsibilities: 1. Pain Pills: Bring all pain pills to every appointment (except for procedure appointments). 2. Pill Bottles: Bring pills in original pharmacy bottle. Always bring the newest bottle. Bring bottle, even if empty. 3. Medication refills: You are responsible for knowing and keeping track of what medications you take and those you need refilled. The day before your appointment: write a list of all prescriptions that need to be refilled. The day of the appointment: give the list to the admitting nurse. Prescriptions will be written only during appointments. No prescriptions will be written on procedure days. If you forget a medication: it will not be "Called in", "Faxed", or "electronically sent".  You will need to get another appointment to get these prescribed. No early refills. Do not call asking to have your prescription filled early. 4. Prescription Accuracy: You are responsible for carefully inspecting your prescriptions before leaving our office. Have the discharge nurse carefully go over each prescription with you, before taking them home. Make sure that your name is accurately spelled, that your address is correct. Check the name and dose of your medication to make sure it is accurate. Check the number of pills, and the written instructions to make sure they are clear and accurate. Make sure that you are given enough medication to last until your next medication refill appointment. 5. Taking Medication: Take medication as prescribed. When it comes to controlled substances, taking less pills or less frequently than prescribed is permitted and encouraged. Never take more pills than instructed. Never take medication more frequently than prescribed.  6. Inform other Doctors: Always inform, all of your healthcare providers, of all the medications you take. 7. Pain Medication from other Providers: You are not allowed to accept any additional pain medication from any other Doctor or Healthcare provider. There are two exceptions to this rule. (see below) In the event that you require additional pain medication, you are responsible for notifying us, as stated below. 8. Cough Medicine: Often these contain an opioid, such as codeine or hydrocodone. Never accept or take cough medicine containing these opioids if you are already taking an opioid* medication. The combination may cause respiratory failure and death. 9. Medication Agreement: You are responsible for carefully reading and following our Medication Agreement. This must be signed before receiving any prescriptions from our practice. Safely store a copy of your signed Agreement. Violations to the Agreement will result in no further prescriptions.  (Additional copies of our Medication Agreement are available upon request.) 10. Laws, Rules, & Regulations: All patients are expected to follow all  Federal and State Laws, Statutes, Rules, & Regulations. Ignorance of the Laws does not constitute a valid excuse.  11. Illegal drugs and Controlled Substances: The use of illegal substances (including, but not limited to marijuana and its derivatives) and/or the illegal use of any controlled substances is strictly prohibited. Violation of this rule may result in the immediate and permanent discontinuation of any and all prescriptions being written by our practice. The use of any illegal substances is prohibited. 12. Adopted CDC guidelines & recommendations: Target dosing levels will be at or below 60 MME/day. Use of benzodiazepines** is not recommended.  Exceptions: There are only two exceptions to the rule of not receiving pain medications from other Healthcare Providers. 1. Exception #1 (Emergencies): In the event of an emergency (i.e.: accident requiring emergency care), you are allowed to receive additional pain medication. However, you are responsible for: As soon as you are able, call our office [phone], at any time of the day or night, and leave a message stating your name, the date and nature of the emergency, and the name and dose of the medication prescribed. In the event that your call is answered by a member of our staff, make sure to document and save the date, time, and the name of the person that took your information.  2. Exception #2 (Planned Surgery): In the event that you are scheduled by another doctor or dentist to have any type of surgery or procedure, you are allowed (for a period no longer than 30 days), to receive additional pain medication, for the acute post-op pain. However, in this case, you are responsible for picking up a copy of our "Post-op Pain Management for Surgeons" handout, and giving it to your surgeon or dentist. This  document is available at our office, and does not require an appointment to obtain it. Simply go to our office during business hours (Monday-Thursday from 8:00 AM to 4:00 PM) (Friday 8:00 AM to 12:00 Noon) or if you have a scheduled appointment with us, prior to your surgery, and ask for it by name. In addition, you are responsible for: calling our office [phone], at any time of the day or night, and leaving a message stating your name, name of your surgeon, type of surgery, and date of procedure or surgery. Failure to comply with your responsibilities may result in termination of therapy involving the controlled substances.  *Opioid medications include: morphine, codeine, oxycodone, oxymorphone, hydrocodone, hydromorphone, meperidine, tramadol, tapentadol, buprenorphine, fentanyl, methadone. **Benzodiazepine medications include: diazepam (Valium), alprazolam (Xanax), clonazepam (Klonopine), lorazepam (Ativan), clorazepate (Tranxene), chlordiazepoxide (Librium), estazolam (Prosom), oxazepam (Serax), temazepam (Restoril), triazolam (Halcion) (Last updated: 04/21/2020) ____________________________________________________________________________________________   ____________________________________________________________________________________________  Medication Recommendations and Reminders  Applies to: All patients receiving prescriptions (written and/or electronic).  Medication Rules & Regulations: These rules and regulations exist for your safety and that of others. They are not flexible and neither are we. Dismissing or ignoring them will be considered "non-compliance" with medication therapy, resulting in complete and irreversible termination of such therapy. (See document titled "Medication Rules" for more details.) In all conscience, because of safety reasons, we cannot continue providing a therapy where the patient does not follow instructions.  Pharmacy of record:   Definition:  This is the pharmacy where your electronic prescriptions will be sent.   We do not endorse any particular pharmacy, however, we have experienced problems with [facility] not securing enough medication supply for the community.  We do not restrict you in your choice of pharmacy. However,   once we write for your prescriptions, we will NOT be re-sending more prescriptions to fix restricted supply problems created by your pharmacy, or your insurance.   The pharmacy listed in the electronic medical record should be the one where you want electronic prescriptions to be sent.  If you choose to change pharmacy, simply notify our nursing staff.  Recommendations:  Keep all of your pain medications in a safe place, under lock and key, even if you live alone. We will NOT replace lost, stolen, or damaged medication.  After you fill your prescription, take 1 week's worth of pills and put them away in a safe place. You should keep a separate, properly labeled bottle for this purpose. The remainder should be kept in the original bottle. Use this as your primary supply, until it runs out. Once it's gone, then you know that you have 1 week's worth of medicine, and it is time to come in for a prescription refill. If you do this correctly, it is unlikely that you will ever run out of medicine.  To make sure that the above recommendation works, it is very important that you make sure your medication refill appointments are scheduled at least 1 week before you run out of medicine. To do this in an effective manner, make sure that you do not leave the office without scheduling your next medication management appointment. Always ask the nursing staff to show you in your prescription , when your medication will be running out. Then arrange for the receptionist to get you a return appointment, at least 7 days before you run out of medicine. Do not wait until you have 1 or 2 pills left, to come in. This is very poor planning and  does not take into consideration that we may need to cancel appointments due to bad weather, sickness, or emergencies affecting our staff.  DO NOT ACCEPT A "Partial Fill": If for any reason your pharmacy does not have enough pills/tablets to completely fill or refill your prescription, do not allow for a "partial fill". The law allows the pharmacy to complete that prescription within 72 hours, without requiring a new prescription. If they do not fill the rest of your prescription within those 72 hours, you will need a separate prescription to fill the remaining amount, which we will NOT provide. If the reason for the partial fill is your insurance, you will need to talk to the pharmacist about payment alternatives for the remaining tablets, but again, DO NOT ACCEPT A PARTIAL FILL, unless you can trust your pharmacist to obtain the remainder of the pills within 72 hours.  Prescription refills and/or changes in medication(s):   Prescription refills, and/or changes in dose or medication, will be conducted only during scheduled medication management appointments. (Applies to both, written and electronic prescriptions.)  No refills on procedure days. No medication will be changed or started on procedure days. No changes, adjustments, and/or refills will be conducted on a procedure day. Doing so will interfere with the diagnostic portion of the procedure.  No phone refills. No medications will be "called into the pharmacy".  No Fax refills.  No weekend refills.  No Holliday refills.  No after hours refills.  Remember:  Business hours are:  Monday to Thursday 8:00 AM to 4:00 PM Provider's Schedule: Milinda Pointer, MD - Appointments are:  Medication management: Monday and Wednesday 8:00 AM to 4:00 PM Procedure day: Tuesday and Thursday 7:30 AM to 4:00 PM Gillis Santa, MD - Appointments are:  Medication management: Tuesday and Thursday 8:00 AM to 4:00 PM Procedure day: Monday and Wednesday  7:30 AM to 4:00 PM (Last update: 12/12/2019) ____________________________________________________________________________________________   ____________________________________________________________________________________________  CBD (cannabidiol) WARNING  Applicable to: All individuals currently taking or considering taking CBD (cannabidiol) and, more important, all patients taking opioid analgesic controlled substances (pain medication). (Example: oxycodone; oxymorphone; hydrocodone; hydromorphone; morphine; methadone; tramadol; tapentadol; fentanyl; buprenorphine; butorphanol; dextromethorphan; meperidine; codeine; etc.)  Legal status: CBD remains a Schedule I drug prohibited for any use. CBD is illegal with one exception. In the [place], CBD has a limited Transport planner (FDA) approval for the treatment of two specific types of epilepsy disorders. Only one CBD product has been approved by the FDA for this purpose: "Epidiolex". FDA is aware that some companies are marketing products containing cannabis and cannabis-derived compounds in ways that violate the [employer], Drug and Cosmetic Act [facility] Act) and that may put the health and safety of consumers at risk. The FDA, a Federal agency, has not enforced the CBD status since 2018.   Legality: Some manufacturers ship CBD products nationally, which is illegal. Often such products are sold online and are therefore available throughout the country. CBD is openly sold in head shops and health food stores in some states where such sales have not been explicitly legalized. Selling unapproved products with unsubstantiated therapeutic claims is not only a violation of the law, but also can put patients at risk, as these products have not been proven to be safe or effective. Federal illegality makes it difficult to conduct research on CBD.  Reference: "FDA Regulation of Cannabis and Cannabis-Derived Products, Including Cannabidiol  (CBD)" - [employer].pt  Warning: CBD is not FDA approved and has not undergo the same manufacturing controls as prescription drugs.  This means that the purity and safety of available CBD may be questionable. Most of the time, despite manufacturer's claims, it is contaminated with THC (delta-9-tetrahydrocannabinol - the chemical in marijuana responsible for the "HIGH").  When this is the case, the [facility] contaminant will trigger a positive urine drug screen (UDS) test for Marijuana (carboxy-THC). Because a positive UDS for any illicit substance is a violation of our medication agreement, your opioid analgesics (pain medicine) may be permanently discontinued.  MORE ABOUT CBD  General Information: CBD  is a derivative of the Marijuana (cannabis sativa) plant discovered in 21. It is one of the 113 identified substances found in Marijuana. It accounts for up to 40% of the plant's extract. As of 2018, preliminary clinical studies on CBD included research for the treatment of anxiety, movement disorders, and pain. CBD is available and consumed in multiple forms, including inhalation of smoke or vapor, as an aerosol spray, and by mouth. It may be supplied as an oil containing CBD, capsules, dried cannabis, or as a liquid solution. CBD is thought not to be as psychoactive as THC (delta-9-tetrahydrocannabinol - the chemical in marijuana responsible for the "HIGH"). Studies suggest that CBD may interact with different biological target receptors in the body, including cannabinoid and other neurotransmitter receptors. As of 2018 the mechanism of action for its biological effects has not been determined.  Side-effects  Adverse reactions: Dry mouth, diarrhea, decreased appetite, fatigue, drowsiness, malaise, weakness, sleep disturbances, and others.  Drug interactions: CBC may interact with other  medications such as blood-thinners. (Last update: 12/29/2019) ____________________________________________________________________________________________    ______________________________________________________________________________________________  Body mass index (BMI)  Body mass index (BMI) is a common tool for deciding whether a person has an  appropriate body weight.  It measures a persons weight in relation to their height.   According to the [employer] of health (NIH): Marland Kitchen A BMI of less than 18.5 means that a person is underweight. . A BMI of between 18.5 and 24.9 is ideal. . A BMI of between 25 and 29.9 is overweight. . A BMI over 30 indicates obesity.  Weight Management Required  URGENT: Your weight has been found to be adversely affecting your health.  Dear [name]:  Your current Estimated body mass index is 37.02 kg/m as calculated from the following:   Height as of this encounter: 5\' 3"  (1.6 m).   Weight as of this encounter: 209 lb (94.8 kg).  Please use the table below to identify your weight category and associated incidence of chronic pain, secondary to your weight.  Body Mass Index (BMI) Classification BMI level (kg/m2) Category Associated incidence of chronic pain  <18  Underweight   18.5-24.9 Ideal body weight   25-29.9 Overweight  20%  30-34.9 Obese (Class I)  68%  35-39.9 Severe obesity (Class II)  136%  >40 Extreme obesity (Class III)  254%   In addition: You will be considered "Morbidly Obese", if your BMI is above 30 and you have one or more of the following conditions which are known to be caused and/or directly associated with obesity: 1.    Type 2 Diabetes (Which in turn can lead to cardiovascular diseases (CVD), stroke, peripheral vascular diseases (PVD), retinopathy, nephropathy, and neuropathy) 2.    Cardiovascular Disease (High Blood Pressure; Congestive Heart Failure; High Cholesterol; Coronary Artery Disease; Angina; or  History of Heart Attacks) 3.    Breathing problems (Asthma; obesity-hypoventilation syndrome; obstructive sleep apnea; chronic inflammatory airway disease; reactive airway disease; or shortness of breath) 4.    Chronic kidney disease 5.    Liver disease (nonalcoholic fatty liver disease) 6.    High blood pressure 7.    Acid reflux (gastroesophageal reflux disease; heartburn) 8.    Osteoarthritis (OA) (with any of the following: hip pain; knee pain; and/or low back pain) 9.    Low back pain (Lumbar Facet Syndrome; and/or Degenerative Disc Disease) 10.  Hip pain (Osteoarthritis of hip) (For every 1 lbs of added body weight, there is a 2 lbs increase in pressure inside of each hip articulation. 1:2 mechanical relationship) 11.  Knee pain (Osteoarthritis of knee) (For every 1 lbs of added body weight, there is a 4 lbs increase in pressure inside of each knee articulation. 1:4 mechanical relationship) (patients with a BMI>30 kg/m2 were 6.8 times more likely to develop knee OA than normal-weight individuals) 12.  Cancer: Epidemiological studies have shown that obesity is a risk factor for: post-menopausal breast cancer; cancers of the endometrium, colon and kidney cancer; malignant adenomas of the oesophagus. Obese subjects have an approximately 1.5-3.5-fold increased risk of developing these cancers compared with normal-weight subjects, and it has been estimated that between 15 and 45% of these cancers can be attributed to overweight. More recent studies suggest that obesity may also increase the risk of other types of cancer, including pancreatic, hepatic and gallbladder cancer. (Ref: Obesity and cancer. Pischon T, Nthlings U, [employer] H. Proc Nutr Soc. 2008 May;67(2):128-45. doi: 10.1017/S0029665108006976.) The International Agency for Research on Cancer (IARC) has identified 13 cancers associated with overweight and obesity: meningioma, multiple myeloma, adenocarcinoma of the esophagus, and cancers of the  thyroid, postmenopausal breast cancer, gallbladder, stomach, liver, pancreas, kidney, ovaries, uterus, colon and rectal (colorectal) cancers.  36 percent of all cancers diagnosed in women and 24 percent of those diagnosed in men are associated with overweight and obesity.  Recommendation: At this point it is urgent that you take a step back and concentrate in loosing weight. Dedicate 100% of your efforts on this task. Nothing else will improve your health more than bringing your weight down and your BMI to less than 30. If you are here, you probably have chronic pain. We know that most chronic pain patients have difficulty exercising secondary to their pain. For this reason, you must rely on proper nutrition and diet in order to lose the weight. If your BMI is above 40, you should seriously consider bariatric surgery. A realistic goal is to lose 10% of your body weight over a period of 12 months.  Be honest to yourself, if over time you have unsuccessfully tried to lose weight, then it is time for you to seek professional help and to enter a medically supervised weight management program, and/or undergo bariatric surgery. Stop procrastinating.   Pain management considerations:  1.    Pharmacological Problems: Be advised that the use of opioid analgesics (oxycodone; hydrocodone; morphine; methadone; codeine; and all of their derivatives) have been associated with decreased metabolism and weight gain.  For this reason, should we see that you are unable to lose weight while taking these medications, it may become necessary for [place] to taper down and indefinitely discontinue them.  2.    Technical Problems: The incidence of successful interventional therapies decreases as the patient's BMI increases. It is much more difficult to accomplish a safe and effective interventional therapy on a patient with a BMI above 35. 3.    Radiation Exposure Problems: The x-rays machine, used to accomplish injection therapies, will  automatically increase their x-ray output in order to capture an appropriate bone image. This means that radiation exposure increases exponentially with the patient's BMI. (The higher the BMI, the higher the radiation exposure.) Although the level of radiation used at a given time is still safe to the patient, it is not for the physician and/or assisting staff. Unfortunately, radiation exposure is accumulative. Because physicians and the staff have to do procedures and be exposed on a daily basis, this can result in health problems such as cancer and radiation burns. Radiation exposure to the staff is monitored by the radiation batches that they wear. The exposure levels are reported back to the staff on a quarterly basis. Depending on levels of exposure, physicians and staff may be obligated by law to decrease this exposure. This means that they have the right and obligation to refuse providing therapies where they may be overexposed to radiation. For this reason, physicians may decline to offer therapies such as radiofrequency ablation or implants to patients with a BMI above 40. 4.    Current Trends: Be advised that the current trend is to no longer offer certain therapies to patients with a BMI equal to, or above 35, due to increase perioperative risks, increased technical procedural difficulties, and excessive radiation exposure to healthcare personnel.  ______________________________________________________________________________________________

## 2020-07-14 NOTE — Progress Notes (Signed)
Nursing Pain Medication Assessment:  Safety precautions to be maintained throughout the outpatient stay will include: orient to surroundings, keep bed in low position, maintain call bell within reach at all times, provide assistance with transfer out of bed and ambulation.  Medication Inspection Compliance: Pill count conducted under aseptic conditions, in front of the patient. Neither the pills nor the bottle was removed from the patient's sight at any time. Once count was completed pills were immediately returned to the patient in their original bottle.  Medication: Hydrocodone/APAP Pill/Patch Count: 14 of 30 pills remain Pill/Patch Appearance: Markings consistent with prescribed medication Bottle Appearance: Standard pharmacy container. Clearly labeled. Filled Date: 06/18/2020  Last Medication intake:  Yesterday   Does not need new prescription for Tramadol.

## 2020-07-18 DIAGNOSIS — R791 Abnormal coagulation profile: Secondary | ICD-10-CM | POA: Diagnosis not present

## 2020-07-23 ENCOUNTER — Encounter: Payer: PPO | Admitting: Pain Medicine

## 2020-07-28 ENCOUNTER — Telehealth: Payer: Self-pay | Admitting: Pain Medicine

## 2020-07-28 ENCOUNTER — Encounter: Payer: PPO | Admitting: Pain Medicine

## 2020-07-28 NOTE — Telephone Encounter (Signed)
Patient wants to get facet block asap, I explained Dr. Dossie Arbour is out of office this week. Also need order for this procedcure. It does not need PA.

## 2020-07-28 NOTE — Telephone Encounter (Signed)
Will have secretary call patient for VV

## 2020-08-02 NOTE — Progress Notes (Signed)
PROVIDER NOTE: Information contained herein reflects review and annotations entered in association with encounter. Interpretation of such information and data should be left to medically-trained personnel. Information provided to patient can be located elsewhere in the medical record under "Patient Instructions". Document created using STT-dictation technology, any transcriptional errors that may result from process are unintentional.    Patient: Kara [name]  Service Category: E/M  Provider: Gaspar Cola, MD  DOB: 1950/12/16  DOS: 08/04/2020  Specialty: Interventional Pain Management  MRN: [id number]  Setting: Ambulatory outpatient  PCP: Tracie Harrier, MD  Type: Established Patient    Referring Provider: Tracie Harrier, MD  Location: Office  Delivery: Face-to-face     HPI  [name], a 70 y.o. year old female, is here today because of her Chronic pain syndrome [G89.4]. Ms. Khiev primary complain today is Back Pain Last encounter: My last encounter with her was on 07/28/2020. Pertinent problems: Ms. 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); Lumbar pars defect (L3 and L4) (Bilateral); Diabetic peripheral neuropathy ([place]); Chronic lower extremity pain (2ry area of Pain) (Bilateral); Chronic radicular pain of lower extremity; Osteoarthritis of hip (Right); Chronic low back pain (1ry area of Pain) (Bilateral) w/o  sciatica; Spondylosis without myelopathy or radiculopathy, lumbosacral region; Other specified dorsopathies, sacral and sacrococcygeal region; Secondary osteoarthritis of multiple sites; Sacral insufficiency fracture; DDD (degenerative disc disease), lumbosacral; Coccygodynia; Chronic sacroiliac joint pain (Left); Osteoarthritis of lumbar spine; Acute postoperative pain; Spondylolisthesis, lumbosacral region; Abnormal MRI, lumbar spine (08/09/2019); and Spasm of muscle of lower back on their pertinent problem list. Pain Assessment: Severity of Chronic pain is reported as a 8 /10. Location: Back Right,Left/Pain radiaties down both leg. Onset: More than a month ago. Quality: Aching,Constant,Burning,Throbbing. Timing: Constant. Modifying factor(s): procedures and laying down on my side. Vitals:  height is '5\' 3"'  (1.6 m) and weight is 208 lb (94.3 kg). Her temperature is 97.3 F (36.3 C) (abnormal). Her blood pressure is 142/79 (abnormal) and her pulse is 87. Her oxygen saturation is 98%.   Reason for encounter: worsening of previously known (established) problem. The patient indicates having a flareup of her right-sided low back pain with radiation of the pain down to the back of the leg to the fault of the knee.  She denies any pain or numbness below that level.  No radicular symptoms.  She would like to come in to have a palliative lumbar facet block done tomorrow if possible.  Unfortunately, she went ahead and stopped her Coumadin last Wednesday, without consulting [place].  She was hoping that we could get her in tomorrow to do that procedure.  However, she did not count on the fact that I was on vacation last week and typically my schedule that week before or after vacation is horrible.  Since she has already stopped her Coumadin, we have added her to the schedule for tomorrow, but we have warned her never to do that again.  RTCB: 10/30/2020 Last procedure done none 06/24/2020: Palliative bilateral lumbar facet  block  Pharmacotherapy Assessment   Analgesic: Hydrocodone/APAP 5/325, 1 tab  PO QD PRN (5 mg/day of hydrocodone) (5 MME) + tramadol 50 mg, 1 tab p.o. every 8 hours (150 mg/day of tramadol) (15 MME) MME: 20 mg/day.   Monitoring: [place] PMP: PDMP reviewed during this encounter.       Pharmacotherapy: No side-effects or adverse reactions reported. Compliance: No problems identified. Effectiveness: Clinically acceptable.  Chauncey Fischer, RN  08/04/2020  8:28 AM  Sign when Signing Visit Safety precautions to be maintained throughout the outpatient stay will include: orient to surroundings, keep bed in low position, maintain call bell within reach at all times, provide assistance with transfer out of bed and ambulation.     UDS:  Summary  Date Value Ref Range Status  09/20/2019 Note  Final    Comment:    ==================================================================== [facility] (MW) ==================================================================== Test                             Result       Flag       Units Drug Absent but Declared for Prescription Verification   Hydrocodone                    Not Detected UNEXPECTED ng/mg creat ==================================================================== Test                      Result    Flag   Units      Ref Range   Creatinine              126              mg/dL      >=20 ==================================================================== Declared Medications:  The flagging and interpretation on this report are based on the  following declared medications.  Unexpected results may arise from  inaccuracies in the declared medications.  **Note: The testing scope of this panel includes these medications:  Hydrocodone  **Note: The testing scope of this panel does not include the  following reported medications:  Acetaminophen  Aspirin  Calcium  Candesartan  Cholecalciferol  Cyanocobalamin  Cyclobenzaprine  Dulaglutide   Gabapentin  Glipizide  Loperamide  Meclizine  Multivitamin  Nystatin  Ropinirole  Warfarin ==================================================================== For clinical consultation, please call [phone]. ====================================================================      ROS  Constitutional: Denies any fever or chills Gastrointestinal: No reported hemesis, hematochezia, vomiting, or acute GI distress Musculoskeletal: Denies any acute onset joint swelling, redness, loss of ROM, or weakness Neurological: No reported episodes of acute onset apraxia, aphasia, dysarthria, agnosia, amnesia, paralysis, loss of coordination, or loss of consciousness  Medication Review  Calcium Polycarbophil, Dulaglutide, HYDROcodone-acetaminophen, Multi-Vitamins, Vitamin D3, aspirin EC, calcium carbonate, candesartan, cyclobenzaprine, gabapentin, glipiZIDE, meclizine, nystatin cream, ondansetron, rOPINIRole, tolterodine, traMADol, vitamin B-12, and warfarin  History Review  Allergy: Ms. Judy is allergic to adhesive [tape], other, latex, and morphine and related. Drug: Ms. Dimaano  reports no history of drug use. Alcohol:  reports no history of alcohol use. Tobacco:  reports that she has never smoked. She has never used smokeless tobacco. Social: Ms. Mcjunkins  reports that she has never smoked. She has never used smokeless tobacco. She reports that she does not drink alcohol and does not use drugs. 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 Surgical: Ms. Pettibone  has a past surgical history that includes Knee surgery; Carpal tunnel release (Bilateral); Colon resection sigmoid (N/A,  11/02/2016); Colostomy (N/A, 11/02/2016); Sigmoidoscopy (N/A, 11/13/2016); PULMONARY VENOGRAPHY (N/A, 11/19/2016); Colonoscopy with propofol (N/A, 02/03/2017); Colon surgery (11/02/2016); IVC FILTER INSERTION (Right, 10/2016); Colostomy reversal (N/A, 02/24/2017); Appendectomy (N/A, 02/24/2017); Lysis of adhesion (N/A, 02/24/2017); laparoscopy (N/A, 02/24/2017); Ileo loop diversion (N/A, 02/24/2017); Sigmoidoscopy (N/A, 05/19/2017); Ileostomy closure (N/A, 06/01/2017); IVC FILTER REMOVAL (N/A, 08/09/2017); Sacroplasty (N/A, 11/08/2017); Cataract extraction w/PHACO (Left, 11/21/2018); and Cataract extraction w/PHACO (Right, 12/12/2018). Family: family history includes Breast cancer in her mother; Cancer in her mother; Diabetes in her mother and sister; Heart disease in her father, mother, and sister.  Laboratory Chemistry Profile   Renal Lab Results  Component Value Date   BUN 23 07/03/2020   CREATININE 0.84 07/03/2020   GFRAA >60 07/09/2019   GFRNONAA >60 07/03/2020     Hepatic Lab Results  Component Value Date   AST 23 07/03/2020   ALT 31 07/03/2020   ALBUMIN 3.3 (L) 07/03/2020   ALKPHOS 41 07/03/2020   LIPASE 34 07/03/2020     Electrolytes Lab Results  Component Value Date   NA 138 07/03/2020   K 3.8 07/03/2020   CL 106 07/03/2020   CALCIUM 8.2 (L) 07/03/2020   MG 2.0 11/02/2017   PHOS 3.3 11/07/2016     Bone Lab Results  Component Value Date   25OHVITD1 25 (L) 11/02/2017   25OHVITD2 <1.0 11/02/2017   25OHVITD3 25 11/02/2017     Inflammation (CRP: Acute Phase) (ESR: Chronic Phase) Lab Results  Component Value Date   CRP 5.3 (H) 11/02/2017   ESRSEDRATE 12 11/02/2017       Note: Above Lab results reviewed.  Recent Imaging Review  CT ABDOMEN PELVIS W CONTRAST CLINICAL DATA:  Nausea, vomiting, diarrhea, unspecified abdominal pain  EXAM: CT ABDOMEN AND PELVIS WITH CONTRAST  TECHNIQUE: Multidetector CT imaging of the abdomen and pelvis was performed using the standard protocol  following bolus administration of intravenous contrast.  CONTRAST:  124m OMNIPAQUE IOHEXOL 300 MG/ML  SOLN  COMPARISON:  08/24/2018  FINDINGS: Lower chest: The visualized lung bases are clear. The visualized heart and pericardium are unremarkable.  Hepatobiliary: Mild hepatic steatosis. No enhancing liver lesion. No intra or extrahepatic biliary ductal dilation. Cholelithiasis noted without pericholecystic inflammatory change.  Pancreas: Unremarkable  Spleen: Unremarkable  Adrenals/Urinary Tract: Adrenal glands are unremarkable. Kidneys are normal, without renal calculi, focal lesion, or hydronephrosis. Bladder is unremarkable.  Stomach/Bowel: Surgical changes of a partial small bowel resection and sigmoid colectomy are identified. The appendix is not visualized and may be absent. Moderate broad-base ventral hernia is present containing and unremarkable loop of transverse colon. The stomach, small bowel, and large bowel are otherwise unremarkable. No free intraperitoneal gas or fluid.  Vascular/Lymphatic: Mild atherosclerotic calcification within the abdominal aorta. No aortic aneurysm. No pathologic adenopathy within the abdomen and pelvis.  Reproductive: Uterus and bilateral adnexa are unremarkable.  Other: Rectum unremarkable  Musculoskeletal: No acute bone abnormality. Advanced degenerative changes are seen within the lumbar spine with vacuum disc phenomena at L4-5 and L5-S1 and grade 1 anterolisthesis at L4-5. Bilateral sacroplasty has likely been performed with methylmethacrylate extending into the sacroiliac joint spaces bilaterally.  IMPRESSION: No acute intra-abdominal pathology identified. No radiographic explanation for the patient's reported symptoms.  Mild hepatic steatosis.  Advanced degenerative changes within the lower lumbar spine.  Aortic Atherosclerosis (ICD10-I70.0).  Electronically Signed   By: AFidela SalisburyMD  On: 07/03/2020 12:35 DG  Chest Portable 1 View CLINICAL DATA:  Nausea and vomiting  EXAM: PORTABLE CHEST 1 VIEW  COMPARISON:  02/27/2017  FINDINGS: Cardiac shadow is stable. Aortic calcifications are noted. The lungs are clear. No bony abnormality is noted.  IMPRESSION: No acute abnormality noted.  Electronically Signed   By: Inez Catalina M.D.   On: 07/03/2020 09:01 Note: Reviewed        Physical Exam  General appearance: Well nourished, well developed, and well hydrated. In no apparent acute distress Mental status: Alert, oriented x 3 (person, place, & time)       Respiratory: No evidence of acute respiratory distress Eyes: PERLA Vitals: BP (!) 142/79   Pulse 87   Temp (!) 97.3 F (36.3 C)   Ht '5\' 3"'  (1.6 m)   Wt 208 lb (94.3 kg)   SpO2 98%   BMI 36.85 kg/m  BMI: Estimated body mass index is 36.85 kg/m as calculated from the following:   Height as of this encounter: '5\' 3"'  (1.6 m).   Weight as of this encounter: 208 lb (94.3 kg). Ideal: Ideal body weight: 52.4 kg (115 lb 8.3 oz) Adjusted ideal body weight: 69.2 kg (152 lb 8.2 oz)  Assessment   Status Diagnosis  Controlled Controlled Controlled 1. Chronic pain syndrome   2. Chronic low back pain (1ry area of Pain) (Bilateral) w/o sciatica   3. Lumbar facet syndrome (Bilateral)   4. Lumbar facet arthropathy (L3-4, L4-5, and L5-S1) (Bilateral)   5. 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 Grade 1-2 Anterolisthesis of L4/L5 & L5/S1   7. 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 Chronic lower extremity pain (2ry area of Pain) (Bilateral)   9. Chronic anticoagulation (COUMADIN)   10. Morbid obesity with BMI of 40.0-44.9, adult ([place])   11. Truncal obesity      Updated Problems: Problem  Truncal Obesity    Plan of Care  Problem-specific:  No problem-specific Assessment & Plan notes found for this encounter.  Ms. AMRA SHUKLA has a current medication list which includes the following long-term  medication(s): calcium carbonate, candesartan, glipizide, glipizide, hydrocodone-acetaminophen, [START ON 08/17/2020] hydrocodone-acetaminophen, [START ON 09/16/2020] hydrocodone-acetaminophen, ropinirole, tramadol, warfarin, and warfarin.  Pharmacotherapy (Medications Ordered): No orders of the defined types were placed in this encounter.  Orders:  Orders Placed This Encounter  Procedures  . LUMBAR FACET(MEDIAL BRANCH NERVE BLOCK) MBNB    Standing Status:   Future    Standing Expiration Date:   09/04/2020    Scheduling Instructions:     Procedure: Lumbar facet block (AKA.: Lumbosacral medial branch nerve block)     Side: Right-sided     Level: L3-4, L4-5, & L5-S1 Facets (L2, L3, L4, L5, & S1 Medial Branch Nerves)     Sedation: With Sedation.     Timeframe: ASAA    Order Specific Question:   Where will this procedure be performed?    Answer:   [name]  . Blood Thinner Instructions to Nursing    Always make sure patient has clearance from prescribing physician to stop blood thinners for interventional therapies. If the patient requires a Lovenox-bridge therapy, make sure arrangements are made to institute it with the assistance of the PCP.    Scheduling Instructions:     Have Ms. Bolla stop the Coumadin (Warfarin) X 5 days prior to procedure or surgery.  . Nursing Instructions:    Weigh and record patient's weight  today. In addition measure and record patient's height in order to get today's BMI.   Follow-up plan:   Return for Procedure (w/ sedation): (R) L-FCT BLK.      Interventional Therapies  Risk  Complexity  Complicating Factors:   1.  Coumadin anticoagulation (Stop:5days  Re-start: 2hrs) 2.  Latex allergy  3.  Morbid obesity (class III) (BMI>40)  4.  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   Completed:   Palliative/Therapeutic left  L4-5 LESI x1(11/10/2017) Palliative left lumbar facet blockx14(06/24/2020) Palliativeright lumbar facet block(s) x18(06/24/2020) Palliative right SI joint block x8(not as effective as facet Blk) (03/08/2019) Diagnostic left SI joint block x1 (not as effective as facet Blk) (03/08/2019) Palliative/Therapeutic (Midline) caudalESIx3(06/08/2018)   Palliative options:   Palliative/Therapeutic left L4-5 LESI #2 Palliative left lumbar facet block#15 Palliativeright lumbar facet block(s) #19 Palliative right SI joint block #9 Diagnostic left SI joint block #2  Palliative/Therapeutic (Midline) caudalESI#4     Recent Visits Date Type Provider Dept  07/14/20 Office Visit Milinda Pointer, MD [name]  06/24/20 Procedure visit Milinda Pointer, MD [name]  06/18/20 Telemedicine Milinda Pointer, MD [name]  Showing recent visits within past 90 days and meeting all other requirements Today's Visits Date Type Provider Dept  08/04/20 Office Visit Milinda Pointer, MD [name]  Showing today's visits and meeting all other requirements Future Appointments Date Type Provider Dept  08/05/20 Appointment Milinda Pointer, MD [name]  10/15/20 Appointment Milinda Pointer, MD [name]  Showing future appointments within next 90 days and meeting all other requirements  I discussed the assessment and treatment plan with the patient. The patient was provided an opportunity to ask questions and all were answered. The patient agreed with the plan and demonstrated an understanding of the instructions.  Patient advised to call back or seek an in-person evaluation if the symptoms or condition worsens.  Duration of encounter: 34 minutes.  Note by: Gaspar Cola, MD Date: 08/04/2020; Time: 8:57 AM

## 2020-08-04 ENCOUNTER — Encounter: Payer: Self-pay | Admitting: Pain Medicine

## 2020-08-04 ENCOUNTER — Other Ambulatory Visit: Payer: Self-pay

## 2020-08-04 ENCOUNTER — Ambulatory Visit: Payer: PPO | Attending: Pain Medicine | Admitting: Pain Medicine

## 2020-08-04 VITALS — BP 142/79 | HR 87 | Temp 97.3°F | Ht 63.0 in | Wt 208.0 lb

## 2020-08-04 DIAGNOSIS — M545 Low back pain, unspecified: Secondary | ICD-10-CM | POA: Diagnosis not present

## 2020-08-04 DIAGNOSIS — G894 Chronic pain syndrome: Secondary | ICD-10-CM | POA: Diagnosis not present

## 2020-08-04 DIAGNOSIS — Z6841 Body Mass Index (BMI) 40.0 and over, adult: Secondary | ICD-10-CM | POA: Insufficient documentation

## 2020-08-04 DIAGNOSIS — M4310 Spondylolisthesis, site unspecified: Secondary | ICD-10-CM | POA: Diagnosis not present

## 2020-08-04 DIAGNOSIS — M47816 Spondylosis without myelopathy or radiculopathy, lumbar region: Secondary | ICD-10-CM | POA: Diagnosis not present

## 2020-08-04 DIAGNOSIS — M79604 Pain in right leg: Secondary | ICD-10-CM | POA: Insufficient documentation

## 2020-08-04 DIAGNOSIS — G8929 Other chronic pain: Secondary | ICD-10-CM | POA: Insufficient documentation

## 2020-08-04 DIAGNOSIS — M4306 Spondylolysis, lumbar region: Secondary | ICD-10-CM | POA: Insufficient documentation

## 2020-08-04 DIAGNOSIS — R791 Abnormal coagulation profile: Secondary | ICD-10-CM | POA: Diagnosis not present

## 2020-08-04 DIAGNOSIS — E65 Localized adiposity: Secondary | ICD-10-CM | POA: Insufficient documentation

## 2020-08-04 DIAGNOSIS — Z7901 Long term (current) use of anticoagulants: Secondary | ICD-10-CM | POA: Diagnosis not present

## 2020-08-04 DIAGNOSIS — M4300 Spondylolysis, site unspecified: Secondary | ICD-10-CM | POA: Diagnosis not present

## 2020-08-04 DIAGNOSIS — M79605 Pain in left leg: Secondary | ICD-10-CM | POA: Diagnosis not present

## 2020-08-04 NOTE — Patient Instructions (Addendum)
____________________________________________________________________________________________  Preparing for Procedure with Sedation  Procedure appointments are limited to planned procedures: . No Prescription Refills. . No disability issues will be discussed. . No medication changes will be discussed.  Instructions: . Oral Intake: Do not eat or drink anything for at least 8 hours prior to your procedure. (Exception: Blood Pressure Medication. See below.) . Transportation: Unless otherwise stated by your physician, you may drive yourself after the procedure. . Blood Pressure Medicine: Do not forget to take your blood pressure medicine with a sip of water the morning of the procedure. If your Diastolic (lower reading)is above 100 mmHg, elective cases will be cancelled/rescheduled. . Blood thinners: These will need to be stopped for procedures. Notify our staff if you are taking any blood thinners. Depending on which one you take, there will be specific instructions on how and when to stop it. . Diabetics on insulin: Notify the staff so that you can be scheduled 1st case in the morning. If your diabetes requires high dose insulin, take only  of your normal insulin dose the morning of the procedure and notify the staff that you have done so. . Preventing infections: Shower with an antibacterial soap the morning of your procedure. . Build-up your immune system: Take 1000 mg of Vitamin C with every meal (3 times a day) the day prior to your procedure. Marland Kitchen Antibiotics: Inform the staff if you have a condition or reason that requires you to take antibiotics before dental procedures. . Pregnancy: If you are pregnant, call and cancel the procedure. . Sickness: If you have a cold, fever, or any active infections, call and cancel the procedure. . Arrival: You must be in the facility at least 30 minutes prior to your scheduled procedure. . Children: Do not bring children with you. . Dress appropriately:  Bring dark clothing that you would not mind if they get stained. . Valuables: Do not bring any jewelry or valuables.  Reasons to call and reschedule or cancel your procedure: (Following these recommendations will minimize the risk of a serious complication.) . Surgeries: Avoid having procedures within 2 weeks of any surgery. (Avoid for 2 weeks before or after any surgery). . Flu Shots: Avoid having procedures within 2 weeks of a flu shots or . (Avoid for 2 weeks before or after immunizations). . Barium: Avoid having a procedure within 7-10 days after having had a radiological study involving the use of radiological contrast. (Myelograms, Barium swallow or enema study). . Heart attacks: Avoid any elective procedures or surgeries for the initial 6 months after a "Myocardial Infarction" (Heart Attack). . Blood thinners: It is imperative that you stop these medications before procedures. Let us know if you if you take any blood thinner.  . Infection: Avoid procedures during or within two weeks of an infection (including chest colds or gastrointestinal problems). Symptoms associated with infections include: Localized redness, fever, chills, night sweats or profuse sweating, burning sensation when voiding, cough, congestion, stuffiness, runny nose, sore throat, diarrhea, nausea, vomiting, cold or Flu symptoms, recent or current infections. It is specially important if the infection is over the area that we intend to treat. Marland Kitchen Heart and lung problems: Symptoms that may suggest an active cardiopulmonary problem include: cough, chest pain, breathing difficulties or shortness of breath, dizziness, ankle swelling, uncontrolled high or unusually low blood pressure, and/or palpitations. If you are experiencing any of these symptoms, cancel your procedure and contact your primary care physician for an evaluation.  Remember:  Regular Business hours are:  Monday to Thursday 8:00 AM to 4:00 PM  Provider's  Schedule: Milinda Pointer, MD:  Procedure days: Tuesday and Thursday 7:30 AM to 4:00 PM  Gillis Santa, MD:  Procedure days: Monday and Wednesday 7:30 AM to 4:00 PM ____________________________________________________________________________________________   ____________________________________________________________________________________________  Blood Thinners  IMPORTANT NOTICE:  If you take any of these, make sure to notify the nursing staff.  Failure to do so may result in injury.  Recommended time intervals to stop and restart blood-thinners, before & after invasive procedures  Generic Name Brand Name Stop Time. Must be stopped at least this long before procedures. After procedures, wait at least this long before re-starting.  Abciximab Reopro 15 days 2 hrs  Alteplase Activase 10 days 10 days  Anagrelide Agrylin    Apixaban Eliquis 3 days 6 hrs  Cilostazol Pletal 3 days 5 hrs  Clopidogrel Plavix 7-10 days 2 hrs  Dabigatran Pradaxa 5 days 6 hrs  Dalteparin Fragmin 24 hours 4 hrs  Dipyridamole Aggrenox 11days 2 hrs  Edoxaban Lixiana; Savaysa 3 days 2 hrs  Enoxaparin  Lovenox 24 hours 4 hrs  Eptifibatide Integrillin 8 hours 2 hrs  Fondaparinux  Arixtra 72 hours 12 hrs  Hydroxychloroquine Plaquenil 11 days   Prasugrel Effient 7-10 days 6 hrs  Reteplase Retavase 10 days 10 days  Rivaroxaban Xarelto 3 days 6 hrs  Ticagrelor Brilinta 5-7 days 6 hrs  Ticlopidine Ticlid 10-14 days 2 hrs  Tinzaparin Innohep 24 hours 4 hrs  Tirofiban Aggrastat 8 hours 2 hrs  Warfarin Coumadin 5 days 2 hrs   Other medications with blood-thinning effects  Product indications Generic (Brand) names Note  Cholesterol Lipitor Stop 4 days before procedure  Blood thinner (injectable) Heparin (LMW or LMWH Heparin) Stop 24 hours before procedure  Cancer Ibrutinib (Imbruvica) Stop 7 days before procedure  Malaria/Rheumatoid Hydroxychloroquine (Plaquenil) Stop 11 days before procedure   Thrombolytics  10 days before or after procedures   Over-the-counter (OTC) Products with blood-thinning effects  Product Common names Stop Time  Aspirin > 325 mg Goody Powders, Excedrin, etc. 11 days  Aspirin ? 81 mg  7 days  Fish oil  4 days  Garlic supplements  7 days  Ginkgo biloba  36 hours  Ginseng  24 hours  NSAIDs Ibuprofen, Naprosyn, etc. 3 days  Vitamin E  4 days   ____________________________________________________________________________________________  Facet Blocks Patient Information  Description: The facets are joints in the spine between the vertebrae.  Like any joints in the body, facets can become irritated and painful.  Arthritis can also effect the facets.  By injecting steroids and local anesthetic in and around these joints, we can temporarily block the nerve supply to them.  Steroids act directly on irritated nerves and tissues to reduce selling and inflammation which often leads to decreased pain.  Facet blocks may be done anywhere along the spine from the neck to the low back depending upon the location of your pain.   After numbing the skin with local anesthetic (like Novocaine), a small needle is passed onto the facet joints under x-ray guidance.  You may experience a sensation of pressure while this is being done.  The entire block usually lasts about 15-25 minutes.   Conditions which may be treated by facet blocks:   Low back/buttock pain  Neck/shoulder pain  Certain types of headaches  Preparation for the injection:  1. Do not eat any solid food or dairy products within 8 hours of your appointment. 2. You may drink clear liquid  up to 3 hours before appointment.  Clear liquids include water, black coffee, juice or soda.  No milk or cream please. 3. You may take your regular medication, including pain medications, with a sip of water before your appointment.  Diabetics should hold regular insulin (if taken separately) and take 1/2 normal NPH dose the  morning of the procedure.  Carry some sugar containing items with you to your appointment. 4. A driver must accompany you and be prepared to drive you home after your procedure. 5. Bring all your current medications with you. 6. An IV may be inserted and sedation may be given at the discretion of the physician. 7. A blood pressure cuff, EKG and other monitors will often be applied during the procedure.  Some patients may need to have extra oxygen administered for a short period. 8. You will be asked to provide medical information, including your allergies and medications, prior to the procedure.  We must know immediately if you are taking blood thinners (like Coumadin/Warfarin) or if you are allergic to IV iodine contrast (dye).  We must know if you could possible be pregnant.  Possible side-effects:   Bleeding from needle site  Infection (rare, may require surgery)  Nerve injury (rare)  Numbness & tingling (temporary)  Difficulty urinating (rare, temporary)  Spinal headache (a headache worse with upright posture)  Light-headedness (temporary)  Pain at injection site (serveral days)  Decreased blood pressure (rare, temporary)  Weakness in arm/leg (temporary)  Pressure sensation in back/neck (temporary)   Call if you experience:   Fever/chills associated with headache or increased back/neck pain  Headache worsened by an upright position  New onset, weakness or numbness of an extremity below the injection site  Hives or difficulty breathing (go to the emergency room)  Inflammation or drainage at the injection site(s)  Severe back/neck pain greater than usual  New symptoms which are concerning to you  Please note:  Although the local anesthetic injected can often make your back or neck feel good for several hours after the injection, the pain will likely return. It takes 3-7 days for steroids to work.  You may not notice any pain relief for at least one week.  If  effective, we will often do a series of 2-3 injections spaced 3-6 weeks apart to maximally decrease your pain.  After the initial series, you may be a candidate for a more permanent nerve block of the facets.  If you have any questions, please call #[phone]) [facility]

## 2020-08-04 NOTE — Progress Notes (Signed)
Safety precautions to be maintained throughout the outpatient stay will include: orient to surroundings, keep bed in low position, maintain call bell within reach at all times, provide assistance with transfer out of bed and ambulation.  

## 2020-08-05 ENCOUNTER — Ambulatory Visit
Admission: RE | Admit: 2020-08-05 | Discharge: 2020-08-05 | Disposition: A | Discharge status: Home / Self Care | Payer: PPO | Source: Ambulatory Visit | Attending: Pain Medicine | Admitting: Pain Medicine

## 2020-08-05 ENCOUNTER — Ambulatory Visit (HOSPITAL_BASED_OUTPATIENT_CLINIC_OR_DEPARTMENT_OTHER): Payer: PPO | Admitting: Pain Medicine

## 2020-08-05 ENCOUNTER — Encounter: Payer: Self-pay | Admitting: Pain Medicine

## 2020-08-05 VITALS — BP 130/62 | HR 88 | Temp 96.9°F | Resp 18 | Ht 63.0 in | Wt 208.0 lb

## 2020-08-05 DIAGNOSIS — Z91040 Latex allergy status: Secondary | ICD-10-CM | POA: Diagnosis not present

## 2020-08-05 DIAGNOSIS — M62830 Muscle spasm of back: Secondary | ICD-10-CM | POA: Diagnosis not present

## 2020-08-05 DIAGNOSIS — G8918 Other acute postprocedural pain: Secondary | ICD-10-CM | POA: Diagnosis not present

## 2020-08-05 DIAGNOSIS — M47817 Spondylosis without myelopathy or radiculopathy, lumbosacral region: Secondary | ICD-10-CM

## 2020-08-05 DIAGNOSIS — E65 Localized adiposity: Secondary | ICD-10-CM | POA: Insufficient documentation

## 2020-08-05 DIAGNOSIS — Z6841 Body Mass Index (BMI) 40.0 and over, adult: Secondary | ICD-10-CM | POA: Diagnosis not present

## 2020-08-05 DIAGNOSIS — M5137 Other intervertebral disc degeneration, lumbosacral region: Secondary | ICD-10-CM | POA: Insufficient documentation

## 2020-08-05 DIAGNOSIS — M4306 Spondylolysis, lumbar region: Secondary | ICD-10-CM | POA: Diagnosis not present

## 2020-08-05 DIAGNOSIS — G8929 Other chronic pain: Secondary | ICD-10-CM

## 2020-08-05 DIAGNOSIS — M51379 Other intervertebral disc degeneration, lumbosacral region without mention of lumbar back pain or lower extremity pain: Secondary | ICD-10-CM

## 2020-08-05 DIAGNOSIS — Z7901 Long term (current) use of anticoagulants: Secondary | ICD-10-CM | POA: Diagnosis not present

## 2020-08-05 DIAGNOSIS — M545 Low back pain, unspecified: Secondary | ICD-10-CM | POA: Insufficient documentation

## 2020-08-05 DIAGNOSIS — M4310 Spondylolisthesis, site unspecified: Secondary | ICD-10-CM

## 2020-08-05 DIAGNOSIS — M47816 Spondylosis without myelopathy or radiculopathy, lumbar region: Secondary | ICD-10-CM | POA: Diagnosis not present

## 2020-08-05 MED ORDER — ROPIVACAINE HCL 2 MG/ML IJ SOLN
9.0000 mL | Freq: Once | INTRAMUSCULAR | Status: AC
  Administered 2020-08-05: 9 mL via PERINEURAL

## 2020-08-05 MED ORDER — ORPHENADRINE CITRATE 30 MG/ML IJ SOLN
60.0000 mg | Freq: Once | INTRAMUSCULAR | Status: AC
Start: 2020-08-05 — End: 2020-08-05
  Administered 2020-08-05: 60 mg via INTRAMUSCULAR
  Filled 2020-08-05: qty 2

## 2020-08-05 MED ORDER — LIDOCAINE HCL 2 % IJ SOLN
20.0000 mL | Freq: Once | INTRAMUSCULAR | Status: AC
  Administered 2020-08-05: 200 mg

## 2020-08-05 MED ORDER — TRIAMCINOLONE ACETONIDE 40 MG/ML IJ SUSP
40.0000 mg | Freq: Once | INTRAMUSCULAR | Status: AC
  Administered 2020-08-05: 40 mg

## 2020-08-05 MED ORDER — LIDOCAINE HCL (PF) 2 % IJ SOLN
INTRAMUSCULAR | Status: AC
  Filled 2020-08-05: qty 10

## 2020-08-05 MED ORDER — MIDAZOLAM HCL 5 MG/5ML IJ SOLN
1.0000 mg | INTRAMUSCULAR | Status: DC | PRN
  Administered 2020-08-05: 1 mg via INTRAVENOUS

## 2020-08-05 MED ORDER — MIDAZOLAM HCL 5 MG/5ML IJ SOLN
INTRAMUSCULAR | Status: AC
  Filled 2020-08-05: qty 5

## 2020-08-05 MED ORDER — TRIAMCINOLONE ACETONIDE 40 MG/ML IJ SUSP
INTRAMUSCULAR | Status: AC
  Filled 2020-08-05: qty 1

## 2020-08-05 MED ORDER — FENTANYL CITRATE (PF) 100 MCG/2ML IJ SOLN
25.0000 ug | INTRAMUSCULAR | Status: DC | PRN
  Administered 2020-08-05: 50 ug via INTRAVENOUS

## 2020-08-05 MED ORDER — LACTATED RINGERS IV SOLN
1000.0000 mL | Freq: Once | INTRAVENOUS | Status: AC
  Administered 2020-08-05: 1000 mL via INTRAVENOUS

## 2020-08-05 MED ORDER — FENTANYL CITRATE (PF) 100 MCG/2ML IJ SOLN
INTRAMUSCULAR | Status: AC
  Filled 2020-08-05: qty 2

## 2020-08-05 MED ORDER — KETOROLAC TROMETHAMINE 60 MG/2ML IM SOLN
60.0000 mg | Freq: Once | INTRAMUSCULAR | Status: AC
Start: 2020-08-05 — End: 2020-08-05
  Administered 2020-08-05: 60 mg via INTRAMUSCULAR
  Filled 2020-08-05: qty 2

## 2020-08-05 MED ORDER — ROPIVACAINE HCL 2 MG/ML IJ SOLN
INTRAMUSCULAR | Status: AC
  Filled 2020-08-05: qty 10

## 2020-08-05 NOTE — Patient Instructions (Signed)

## 2020-08-05 NOTE — Progress Notes (Signed)
Safety precautions to be maintained throughout the outpatient stay will include: orient to surroundings, keep bed in low position, maintain call bell within reach at all times, provide assistance with transfer out of bed and ambulation.  

## 2020-08-05 NOTE — Progress Notes (Signed)
PROVIDER NOTE: Information contained herein reflects review and annotations entered in association with encounter. Interpretation of such information and data should be left to medically-trained personnel. Information provided to patient can be located elsewhere in the medical record under "Patient Instructions". Document created using STT-dictation technology, any transcriptional errors that may result from process are unintentional.    Patient: Kara [name]  Service Category: Procedure  Provider: Gaspar Cola, MD  DOB: 1950-05-27  DOS: 08/05/2020  Location: [facility]  MRN: [id number]  Setting: Ambulatory - outpatient  Referring Provider: Tracie Harrier, MD  Type: Established Patient  Specialty: Interventional Pain Management  PCP: Tracie Harrier, MD   Primary Reason for Visit: Interventional Pain Management Treatment. CC: Back Pain (lower)  Procedure:          Anesthesia, Analgesia, Anxiolysis:  Type: Lumbar Facet, Medial Branch Block(s)          Primary Purpose: Palliative Region: Posterolateral Lumbosacral Spine Level: L2, L3, L4, L5, & S1 Medial Branch Level(s). Injecting these levels blocks the L3-4, L4-5, and L5-S1 lumbar facet joints. Laterality: Right  Type: Moderate (Conscious) Sedation combined with Local Anesthesia Indication(s): Analgesia and Anxiety Route: Intravenous (IV) IV Access: Secured Sedation: Meaningful verbal contact was maintained at all times during the procedure  Local Anesthetic: Lidocaine 1-2%  Position: Prone   Indications: 1. Lumbar facet syndrome (Bilateral)   2. Lumbar facet arthropathy (L3-4, L4-5, and L5-S1) (Bilateral)   3. 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 Spondylosis without myelopathy or radiculopathy, lumbosacral region   6. Chronic low back pain (1ry area of Pain) (Bilateral) w/o sciatica   7. DDD (degenerative disc disease), lumbosacral   8.  Morbid obesity with BMI of 40.0-44.9, adult ([place])   9. Truncal obesity   10. Chronic anticoagulation (COUMADIN)   11. History of allergy to latex    Pain Score: Pre-procedure: 9 /10 Post-procedure: 1 /10   Pre-op H&P Assessment:  Kara [name] is a 70 y.o. (year old), female patient, seen today for interventional treatment. She  has a past surgical history that includes Knee surgery; Carpal tunnel release (Bilateral); Colon resection sigmoid (N/A, 11/02/2016); Colostomy (N/A, 11/02/2016); Sigmoidoscopy (N/A, 11/13/2016); PULMONARY VENOGRAPHY (N/A, 11/19/2016); Colonoscopy with propofol (N/A, 02/03/2017); Colon surgery (11/02/2016); IVC FILTER INSERTION (Right, 10/2016); Colostomy reversal (N/A, 02/24/2017); Appendectomy (N/A, 02/24/2017); Lysis of adhesion (N/A, 02/24/2017); laparoscopy (N/A, 02/24/2017); Ileo loop diversion (N/A, 02/24/2017); Sigmoidoscopy (N/A, 05/19/2017); Ileostomy closure (N/A, 06/01/2017); IVC FILTER REMOVAL (N/A, 08/09/2017); Sacroplasty (N/A, 11/08/2017); Cataract extraction w/PHACO (Left, 11/21/2018); and Cataract extraction w/PHACO (Right, 12/12/2018). Kara [name] has a current medication list which includes the following prescription(s): aspirin ec, calcium carbonate, calcium polycarbophil, candesartan, vitamin d3, cyclobenzaprine, dulaglutide, gabapentin, gabapentin, glipizide, glipizide, hydrocodone-acetaminophen, [START ON 08/17/2020] hydrocodone-acetaminophen, [START ON 09/16/2020] hydrocodone-acetaminophen, meclizine, multi-vitamins, nystatin cream, ondansetron, ropinirole, tolterodine, tramadol, vitamin b-12, warfarin, and warfarin, and the following Facility-Administered Medications: fentanyl and midazolam. Her primarily concern today is the Back Pain (lower)  Initial Vital Signs:  Pulse/HCG Rate: 88ECG Heart Rate: 85 Temp: (!) 96.9 F (36.1 C) Resp: 18 BP: (!) 152/76 SpO2: 97 %  BMI: Estimated body mass index is 36.85 kg/m as calculated from the following:    Height as of this encounter: 5\' 3"  (1.6 m).   Weight as of this encounter: 208 lb (94.3 kg).  Risk Assessment: Allergies: Reviewed. She is allergic to adhesive [tape], other, latex, and morphine and related.  Allergy Precautions: None required  Coagulopathies: Reviewed. None identified.  Blood-thinner therapy: None at this time Active Infection(s): Reviewed. None identified. Kara [name] is afebrile  Site Confirmation: Kara [name] was asked to confirm the procedure and laterality before marking the site Procedure checklist: Completed Consent: Before the procedure and under the influence of no sedative(s), amnesic(s), or anxiolytics, the patient was informed of the treatment options, risks and possible complications. To fulfill our ethical and legal obligations, as recommended by the American Medical Association's Code of Ethics, I have informed the patient of my clinical impression; the nature and purpose of the treatment or procedure; the risks, benefits, and possible complications of the intervention; the alternatives, including doing nothing; the risk(s) and benefit(s) of the alternative treatment(s) or procedure(s); and the risk(s) and benefit(s) of doing nothing. The patient was provided information about the general risks and possible complications associated with the procedure. These may include, but are not limited to: failure to achieve desired goals, infection, bleeding, organ or nerve damage, allergic reactions, paralysis, and death. In addition, the patient was informed of those risks and complications associated to Spine-related procedures, such as failure to decrease pain; infection (i.e.: Meningitis, epidural or intraspinal abscess); bleeding (i.e.: epidural hematoma, subarachnoid hemorrhage, or any other type of intraspinal or peri-dural bleeding); organ or nerve damage (i.e.: Any type of peripheral nerve, nerve root, or spinal cord injury) with subsequent damage to  sensory, motor, and/or autonomic systems, resulting in permanent pain, numbness, and/or weakness of one or several areas of the body; allergic reactions; (i.e.: anaphylactic reaction); and/or death. Furthermore, the patient was informed of those risks and complications associated with the medications. These include, but are not limited to: allergic reactions (i.e.: anaphylactic or anaphylactoid reaction(s)); adrenal axis suppression; blood sugar elevation that in diabetics may result in ketoacidosis or comma; water retention that in patients with history of congestive heart failure may result in shortness of breath, pulmonary edema, and decompensation with resultant heart failure; weight gain; swelling or edema; medication-induced neural toxicity; particulate matter embolism and blood vessel occlusion with resultant organ, and/or nervous system infarction; and/or aseptic necrosis of one or more joints. Finally, the patient was informed that Medicine is not an exact science; therefore, there is also the possibility of unforeseen or unpredictable risks and/or possible complications that may result in a catastrophic outcome. The patient indicated having understood very clearly. We have given the patient no guarantees and we have made no promises. Enough time was given to the patient to ask questions, all of which were answered to the patient's satisfaction. Ms. Kirsch has indicated that she wanted to continue with the procedure. Attestation: I, the ordering provider, attest that I have discussed with the patient the benefits, risks, side-effects, alternatives, likelihood of achieving goals, and potential problems during recovery for the procedure that I have provided informed consent. Date  Time: 08/05/2020  8:56 AM  Pre-Procedure Preparation:  Monitoring: As per clinic protocol. Respiration, ETCO2, SpO2, BP, heart rate and rhythm monitor placed and checked for adequate function Safety Precautions:  Patient was assessed for positional comfort and pressure points before starting the procedure. Time-out: I initiated and conducted the "Time-out" before starting the procedure, as per protocol. The patient was asked to participate by confirming the accuracy of the "Time Out" information. Verification of the correct person, site, and procedure were performed and confirmed by me, the nursing staff, and the patient. "Time-out" conducted as per Joint Commission's Universal Protocol (UP.01.01.01). Time: 0951  Description of Procedure:  Laterality: Right Levels:  L2, L3, L4, L5, & S1 Medial Branch Level(s) Area Prepped: Posterior Lumbosacral Region DuraPrep (Iodine Povacrylex [0.7% available iodine] and Isopropyl Alcohol, 74% w/w) Safety Precautions: Aspiration looking for blood return was conducted prior to all injections. At no point did we inject any substances, as a needle was being advanced. Before injecting, the patient was told to immediately notify me if she was experiencing any new onset of "ringing in the ears, or metallic taste in the mouth". No attempts were made at seeking any paresthesias. Safe injection practices and needle disposal techniques used. Medications properly checked for expiration dates. SDV (single dose vial) medications used. After the completion of the procedure, all disposable equipment used was discarded in the proper designated medical waste containers. Local Anesthesia: Protocol guidelines were followed. The patient was positioned over the fluoroscopy table. The area was prepped in the usual manner. The time-out was completed. The target area was identified using fluoroscopy. A 12-in long, straight, sterile hemostat was used with fluoroscopic guidance to locate the targets for each level blocked. Once located, the skin was marked with an approved surgical skin marker. Once all sites were marked, the skin (epidermis, dermis, and hypodermis), as well as deeper tissues  (fat, connective tissue and muscle) were infiltrated with a small amount of a short-acting local anesthetic, loaded on a 10cc syringe with a 25G, 1.5-in  Needle. An appropriate amount of time was allowed for local anesthetics to take effect before proceeding to the next step. Local Anesthetic: Lidocaine 2.0% The unused portion of the local anesthetic was discarded in the proper designated containers. Technical explanation of process:  L2 Medial Branch Nerve Block (MBB): The target area for the L2 medial branch is at the junction of the postero-lateral aspect of the superior articular process and the superior, posterior, and medial edge of the transverse process of L3. Under fluoroscopic guidance, a Quincke needle was inserted until contact was made with os over the superior postero-lateral aspect of the pedicular shadow (target area). After negative aspiration for blood, 2.0 mL of the nerve block solution was injected without difficulty or complication. The needle was removed intact. L3 Medial Branch Nerve Block (MBB): The target area for the L3 medial branch is at the junction of the postero-lateral aspect of the superior articular process and the superior, posterior, and medial edge of the transverse process of L4. Under fluoroscopic guidance, a Quincke needle was inserted until contact was made with os over the superior postero-lateral aspect of the pedicular shadow (target area). After negative aspiration for blood, 2.0 mL of the nerve block solution was injected without difficulty or complication. The needle was removed intact. L4 Medial Branch Nerve Block (MBB): The target area for the L4 medial branch is at the junction of the postero-lateral aspect of the superior articular process and the superior, posterior, and medial edge of the transverse process of L5. Under fluoroscopic guidance, a Quincke needle was inserted until contact was made with os over the superior postero-lateral aspect of the pedicular  shadow (target area). After negative aspiration for blood, 2.0 mL of the nerve block solution was injected without difficulty or complication. The needle was removed intact. L5 Medial Branch Nerve Block (MBB): The target area for the L5 medial branch is at the junction of the postero-lateral aspect of the superior articular process and the superior, posterior, and medial edge of the sacral ala. Under fluoroscopic guidance, a Quincke needle was inserted until contact was made with os over the  superior postero-lateral aspect of the pedicular shadow (target area). After negative aspiration for blood, 2.0 mL of the nerve block solution was injected without difficulty or complication. The needle was removed intact. S1 Medial Branch Nerve Block (MBB): The target area for the S1 medial branch is at the posterior and inferior 6 o'clock position of the L5-S1 facet joint. Under fluoroscopic guidance, the Quincke needle inserted for the L5 MBB was redirected until contact was made with os over the inferior and postero aspect of the sacrum, at the 6 o' clock position under the L5-S1 facet joint (Target area). After negative aspiration for blood, 2.0 mL of the nerve block solution was injected without difficulty or complication. The needle was removed intact.  Nerve block solution: 0.2% PF-Ropivacaine + Triamcinolone (40 mg/mL) diluted to a final concentration of 4 mg of Triamcinolone/mL of Ropivacaine The unused portion of the solution was discarded in the proper designated containers. Procedural Needles: 22-gauge, 7-inch, Quincke needles used for all levels.  Once the entire procedure was completed, the treated area was cleaned, making sure to leave some of the prepping solution back to take advantage of its long term bactericidal properties.   Illustration of the posterior view of the lumbar spine and the posterior neural structures. Laminae of L2 through S1 are labeled. DPRL5, dorsal primary ramus of L5; DPRS1,  dorsal primary ramus of S1; DPR3, dorsal primary ramus of L3; FJ, facet (zygapophyseal) joint L3-L4; I, inferior articular process of L4; LB1, lateral branch of dorsal primary ramus of L1; IAB, inferior articular branches from L3 medial branch (supplies L4-L5 facet joint); IBP, intermediate branch plexus; MB3, medial branch of dorsal primary ramus of L3; NR3, third lumbar nerve root; S, superior articular process of L5; SAB, superior articular branches from L4 (supplies L4-5 facet joint also); TP3, transverse process of L3.  Vitals:   08/05/20 0959 08/05/20 1009 08/05/20 1019 08/05/20 1029  BP: 131/78 127/65 132/66 130/62  Pulse:      Resp: (!) 23 18 18 18   Temp:  (!) 96.9 F (36.1 C)  (!) 96.9 F (36.1 C)  TempSrc:      SpO2: 95% 95% 96% 96%  Weight:      Height:         Start Time: 0951 hrs. End Time: 0958 hrs.  Imaging Guidance (Spinal):          Type of Imaging Technique: Fluoroscopy Guidance (Spinal) Indication(s): Assistance in needle guidance and placement for procedures requiring needle placement in or near specific anatomical locations not easily accessible without such assistance. Exposure Time: Please see nurses notes. Contrast: None used. Fluoroscopic Guidance: I was personally present during the use of fluoroscopy. "Tunnel Vision Technique" used to obtain the best possible view of the target area. Parallax error corrected before commencing the procedure. "Direction-depth-direction" technique used to introduce the needle under continuous pulsed fluoroscopy. Once target was reached, antero-posterior, oblique, and lateral fluoroscopic projection used confirm needle placement in all planes. Images permanently stored in EMR. Interpretation: No contrast injected. I personally interpreted the imaging intraoperatively. Adequate needle placement confirmed in multiple planes. Permanent images saved into the patient's record.  Antibiotic Prophylaxis:   Anti-infectives (From admission,  onward)   None     Indication(s): None identified  Post-operative Assessment:  Post-procedure Vital Signs:  Pulse/HCG Rate: 8887 Temp: (!) 96.9 F (36.1 C) Resp: 18 BP: 130/62 SpO2: 96 %  EBL: None  Complications: No immediate post-treatment complications observed by team, or reported by patient.  Note: The  patient tolerated the entire procedure well. A repeat set of vitals were taken after the procedure and the patient was kept under observation following institutional policy, for this type of procedure. Post-procedural neurological assessment was performed, showing return to baseline, prior to discharge. The patient was provided with post-procedure discharge instructions, including a section on how to identify potential problems. Should any problems arise concerning this procedure, the patient was given instructions to immediately contact us, at any time, without hesitation. In any case, we plan to contact the patient by telephone for a follow-up status report regarding this interventional procedure.  Comments:  No additional relevant information.  Plan of Care  Orders:  Orders Placed This Encounter  Procedures  . LUMBAR FACET(MEDIAL BRANCH NERVE BLOCK) MBNB    Scheduling Instructions:     Procedure: Lumbar facet block (AKA.: Lumbosacral medial branch nerve block)     Side: Right-sided     Level: L3-4, L4-5, & L5-S1 Facets (L2, L3, L4, L5, & S1 Medial Branch Nerves)     Sedation: Patient's choice.     Timeframe: Today    Order Specific Question:   Where will this procedure be performed?    Answer:   [name]  . DG [facility] C-ARM 1-60 MIN NO REPORT    Intraoperative interpretation by procedural physician at [place].    Standing Status:   Standing    Number of Occurrences:   1    Order Specific Question:   Reason for exam:    Answer:   Assistance in needle guidance and placement for procedures requiring needle placement in or near specific  anatomical locations not easily accessible without such assistance.  . Informed Consent Details: Physician/Practitioner Attestation; Transcribe to consent form and obtain patient signature    Nursing Order: Transcribe to consent form and obtain patient signature. Note: Always confirm laterality of pain with Ms. Stagliano, before procedure.    Order Specific Question:   Physician/Practitioner attestation of informed consent for procedure/surgical case    Answer:   I, the physician/practitioner, attest that I have discussed with the patient the benefits, risks, side effects, alternatives, likelihood of achieving goals and potential problems during recovery for the procedure that I have provided informed consent.    Order Specific Question:   Procedure    Answer:   Lumbar Facet Block  under fluoroscopic guidance    Order Specific Question:   Physician/Practitioner performing the procedure    Answer:   Francisco A. Dossie Arbour MD    Order Specific Question:   Indication/Reason    Answer:   Low Back Pain, with our without leg pain, due to Facet Joint Arthralgia (Joint Pain) Spondylosis (Arthritis of the Spine), without myelopathy or radiculopathy (Nerve Damage).  . Care order/instruction: Please confirm that the patient has stopped the Coumadin (Warfarin) X 5 days prior to procedure or surgery.    Please confirm that the patient has stopped the Coumadin (Warfarin) X 5 days prior to procedure or surgery.    Standing Status:   Standing    Number of Occurrences:   1  . Provide equipment / supplies at bedside    "Block Tray" (Disposable  single use) Needle type: SpinalSpinal Amount/quantity: 4 Size: Long (7-inch) Gauge: 22G    Standing Status:   Standing    Number of Occurrences:   1    Order Specific Question:   Specify    Answer:   Block Tray  . Bleeding precautions    Standing Status:  Standing    Number of Occurrences:   1  . Latex precautions    Activate Latex-Free Protocol.     Standing Status:   Standing    Number of Occurrences:   1   Chronic Opioid Analgesic:  Hydrocodone/APAP 5/325, 1 tab PO QD PRN (5 mg/day of hydrocodone) (5 MME) + tramadol 50 mg, 1 tab p.o. every 8 hours (150 mg/day of tramadol) (15 MME) MME: 20 mg/day.   Medications ordered for procedure: Meds ordered this encounter  Medications  . lidocaine (XYLOCAINE) 2 % (with pres) injection 400 mg  . lactated ringers infusion 1,000 mL  . midazolam (VERSED) 5 MG/5ML injection 1-2 mg    Make sure Flumazenil is available in the pyxis when using this medication. If oversedation occurs, administer 0.2 mg IV over 15 sec. If after 45 sec no response, administer 0.2 mg again over 1 min; may repeat at 1 min intervals; not to exceed 4 doses (1 mg)  . fentaNYL (SUBLIMAZE) injection 25-50 mcg    Make sure Narcan is available in the pyxis when using this medication. In the event of respiratory depression (RR< 8/min): Titrate NARCAN (naloxone) in increments of 0.1 to 0.2 mg IV at 2-3 minute intervals, until desired degree of reversal.  . ketorolac (TORADOL) injection 60 mg  . orphenadrine (NORFLEX) injection 60 mg  . ropivacaine (PF) 2 mg/mL (0.2%) (NAROPIN) injection 9 mL  . triamcinolone acetonide (KENALOG-40) injection 40 mg   Medications administered: We administered lidocaine, lactated ringers, midazolam, fentaNYL, ketorolac, orphenadrine, ropivacaine (PF) 2 mg/mL (0.2%), and triamcinolone acetonide.  See the medical record for exact dosing, route, and time of administration.  Follow-up plan:   Return in about 2 weeks (around 08/19/2020) for on pm PD-schld, (VV), (PPE).       Interventional Therapies  Risk  Complexity  Complicating Factors:   1.  Coumadin anticoagulation (Stop:5days  Re-start: 2hrs) 2.  Latex allergy  3.  Morbid obesity (class III) (BMI>40)  4.  No Lumbar RFA due to morbid obesity    Planned  Pending:   Pending further evaluation   Under consideration:   Palliative  procedures until BMI<35.   Completed:   Palliative/Therapeutic left L4-5 LESI x1(11/10/2017) Palliative left lumbar facet blockx14(06/24/2020) Palliativeright lumbar facet block(s) x18(06/24/2020) Palliative right SI joint block x8(not as effective as facet Blk) (03/08/2019) Diagnostic left SI joint block x1 (not as effective as facet Blk) (03/08/2019) Palliative/Therapeutic (Midline) caudalESIx3(06/08/2018)   Palliative options:   Palliative/Therapeutic left L4-5 LESI #2 Palliative left lumbar facet block#15 Palliativeright lumbar facet block(s) #19 Palliative right SI joint block #9 Diagnostic left SI joint block #2  Palliative/Therapeutic (Midline) caudalESI#4      Recent Visits Date Type Provider Dept  08/04/20 Office Visit Milinda Pointer, MD [name]  07/14/20 Office Visit Milinda Pointer, MD [name]  06/24/20 Procedure visit Milinda Pointer, MD [name]  06/18/20 Telemedicine Milinda Pointer, MD [name]  Showing recent visits within past 90 days and meeting all other requirements Today's Visits Date Type Provider Dept  08/05/20 Procedure visit Milinda Pointer, MD [name]  Showing today's visits and meeting all other requirements Future Appointments Date Type Provider Dept  08/19/20 Appointment Milinda Pointer, MD [name]  10/15/20 Appointment Milinda Pointer, MD [name]  Showing future appointments within next 90 days and meeting all other requirements  Disposition: Discharge home  Discharge (Date  Time): 08/05/2020; 1038 hrs.   Primary Care Physician: Tracie Harrier, MD  Location: [facility] Note by: Gaspar Cola, MD Date: 08/05/2020; Time: 11:57 AM  Disclaimer:  Medicine is not an Chief Strategy Officer. The only guarantee in medicine is that nothing is guaranteed. It is important to note that the  decision to proceed with this intervention was based on the information collected from the patient. The Data and conclusions were drawn from the patient's questionnaire, the interview, and the physical examination. Because the information was provided in large part by the patient, it cannot be guaranteed that it has not been purposely or unconsciously manipulated. Every effort has been made to obtain as much relevant data as possible for this evaluation. It is important to note that the conclusions that lead to this procedure are derived in large part from the available data. Always take into account that the treatment will also be dependent on availability of resources and existing treatment guidelines, considered by other Pain Management Practitioners as being common knowledge and practice, at the time of the intervention. For Medico-Legal purposes, it is also important to point out that variation in procedural techniques and pharmacological choices are the acceptable norm. The indications, contraindications, technique, and results of the above procedure should only be interpreted and judged by a Board-Certified Interventional Pain Specialist with extensive familiarity and expertise in the same exact procedure and technique.

## 2020-08-06 ENCOUNTER — Telehealth: Payer: Self-pay | Admitting: *Deleted

## 2020-08-06 NOTE — Telephone Encounter (Signed)
No problems post procedure. 

## 2020-08-10 DIAGNOSIS — R197 Diarrhea, unspecified: Secondary | ICD-10-CM | POA: Diagnosis not present

## 2020-08-11 ENCOUNTER — Other Ambulatory Visit: Payer: Self-pay

## 2020-08-11 ENCOUNTER — Emergency Department
Admission: EM | Admit: 2020-08-11 | Discharge: 2020-08-11 | Disposition: A | Discharge status: Home / Self Care | Payer: PPO | Attending: Emergency Medicine | Admitting: Emergency Medicine

## 2020-08-11 DIAGNOSIS — R1030 Lower abdominal pain, unspecified: Secondary | ICD-10-CM | POA: Insufficient documentation

## 2020-08-11 DIAGNOSIS — Z7984 Long term (current) use of oral hypoglycemic drugs: Secondary | ICD-10-CM | POA: Insufficient documentation

## 2020-08-11 DIAGNOSIS — I509 Heart failure, unspecified: Secondary | ICD-10-CM | POA: Diagnosis not present

## 2020-08-11 DIAGNOSIS — Z79899 Other long term (current) drug therapy: Secondary | ICD-10-CM | POA: Diagnosis not present

## 2020-08-11 DIAGNOSIS — I110 Hypertensive heart disease with heart failure: Secondary | ICD-10-CM | POA: Insufficient documentation

## 2020-08-11 DIAGNOSIS — E1169 Type 2 diabetes mellitus with other specified complication: Secondary | ICD-10-CM | POA: Insufficient documentation

## 2020-08-11 DIAGNOSIS — Z91040 Latex allergy status: Secondary | ICD-10-CM | POA: Diagnosis not present

## 2020-08-11 DIAGNOSIS — Z7901 Long term (current) use of anticoagulants: Secondary | ICD-10-CM | POA: Insufficient documentation

## 2020-08-11 DIAGNOSIS — E860 Dehydration: Secondary | ICD-10-CM | POA: Diagnosis not present

## 2020-08-11 DIAGNOSIS — R197 Diarrhea, unspecified: Secondary | ICD-10-CM | POA: Diagnosis not present

## 2020-08-11 DIAGNOSIS — E669 Obesity, unspecified: Secondary | ICD-10-CM | POA: Diagnosis not present

## 2020-08-11 DIAGNOSIS — E119 Type 2 diabetes mellitus without complications: Secondary | ICD-10-CM | POA: Diagnosis not present

## 2020-08-11 DIAGNOSIS — Z7982 Long term (current) use of aspirin: Secondary | ICD-10-CM | POA: Diagnosis not present

## 2020-08-11 LAB — COMPREHENSIVE METABOLIC PANEL
ALT: 48 U/L — ABNORMAL HIGH (ref 0–44)
AST: 32 U/L (ref 15–41)
Albumin: 4.1 g/dL (ref 3.5–5.0)
Alkaline Phosphatase: 47 U/L (ref 38–126)
Anion gap: 11 (ref 5–15)
BUN: 18 mg/dL (ref 8–23)
CO2: 20 mmol/L — ABNORMAL LOW (ref 22–32)
Calcium: 9.3 mg/dL (ref 8.9–10.3)
Chloride: 109 mmol/L (ref 98–111)
Creatinine, Ser: 0.8 mg/dL (ref 0.44–1.00)
GFR, Estimated: >60 mL/min (ref >60)
Glucose, Bld: 230 mg/dL — ABNORMAL HIGH (ref 70–99)
Potassium: 3.5 mmol/L (ref 3.5–5.1)
Sodium: 140 mmol/L (ref 135–145)
Total Bilirubin: 0.7 mg/dL (ref 0.3–1.2)
Total Protein: 7.5 g/dL (ref 6.5–8.1)

## 2020-08-11 LAB — GASTROINTESTINAL PANEL BY PCR, STOOL (REPLACES STOOL CULTURE)

## 2020-08-11 LAB — C DIFFICILE QUICK SCREEN W PCR REFLEX
C Diff antigen: NEGATIVE
C Diff interpretation: NOT DETECTED
C Diff toxin: NEGATIVE

## 2020-08-11 LAB — CBC
HCT: 45.4 % (ref 36.0–46.0)
Hemoglobin: 14.3 g/dL (ref 12.0–15.0)
MCH: 28.9 pg (ref 26.0–34.0)
MCHC: 31.5 g/dL (ref 30.0–36.0)
MCV: 91.7 fL (ref 80.0–100.0)
Platelets: 280 10*3/uL (ref 150–400)
RBC: 4.95 MIL/uL (ref 3.87–5.11)
RDW: 14.1 % (ref 11.5–15.5)
WBC: 10.3 10*3/uL (ref 4.0–10.5)
nRBC: 0 % (ref 0.0–0.2)

## 2020-08-11 LAB — LIPASE, BLOOD: Lipase: 38 U/L (ref 11–51)

## 2020-08-11 NOTE — ED Notes (Signed)
NAD noted at time of D/C. Pt taken to lobby via wheelchair by this RN. Pt denies comments/concerns regarding D/C instructions. Verbal consent obtained for D/C at this time.

## 2020-08-11 NOTE — ED Notes (Signed)
Pt assisted to the bathroom at this time, assisted to dress in personal clothing, pt provided clean brief at this time. Pt currently calling husband to come pick her up. VS obtained.

## 2020-08-11 NOTE — ED Notes (Signed)
First rn note: per ems pt with diarrhea since 1800 yesterday. Vitals: 86, 99%ra, 132/93 per ems. Per ems pt took 4-immodium tablets.

## 2020-08-11 NOTE — ED Triage Notes (Signed)
Pt presents to ER c/o diarrhea since appx 1800 tonight.  Pt states she has had several episodes of watery diarrhea tonight.  Pt denies n/v or abdominal pain.  Pt A&O x4 in triage.  NAD noted at this time.

## 2020-08-11 NOTE — Discharge Instructions (Addendum)
We believe your symptoms are caused by either a viral infection or possibly a bad food exposure.  Either way, since your symptoms have improved, we feel it is safe for you to go home and follow up with your regular doctor.  Please read the included information and stick to a bland diet for the next two days.  Drink plenty of clear fluids, and if you were provided with a prescription, please take it according to the label instructions.    You can also check MyChart to find out the results of the GI pathogen panel which might give you a more specific idea of the specific illness that is bothering you.  If you develop any new or worsening symptoms, including persistent vomiting not controlled with medication, fever greater than 101, severe or worsening abdominal pain, or other symptoms that concern you, please return immediately to the Emergency Department.

## 2020-08-11 NOTE — ED Provider Notes (Addendum)
[facility] Emergency Department Provider Note  ____________________________________________   Event Date/Time   First MD Initiated Contact with Patient 08/11/20 0710     (approximate)  I have reviewed the triage vital signs and the nursing notes.   HISTORY  Chief Complaint Diarrhea    HPI Kara [name] is a 70 y.o. female with medical history as listed below who presents for evaluation of acute onset profuse diarrhea starting a few hours prior to arrival.  The patient denies any pain except for a little bit of aching and cramping discomfort in her lower abdomen.  She said that she started to feel "rumbling" in her abdomen and then a couple of hours later started having diarrhea.  She thinks that she had at least 6 or 7 episodes within a couple of hours and she became concerned that she had C. difficile.  She has not had it in the past and has not been on antibiotics recently.  She has not been around anybody with it but her husband has had in the past and it seemed similar to what she was experiencing.  She took 4 Imodium at home but it did not seem to help so she came to the ED.  She said that she had a bowel movement while she was waiting to come to the exam room but has not had any issues since coming to the ED.  She denies fever, sore throat, chest pain, shortness of breath, nausea, vomiting.  Nothing in particular made the symptoms worse and it is possible that the Imodium has started to work and made the diarrhea better.         Past Medical History:  Diagnosis Date  . Anginal pain ([place])   . 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 CHF (congestive heart failure) ([place])    followed colon resection, "wouldn't let me get up"  . Diabetes ([place])    type 2  . Diverticulitis   . History of being hospitalized    1 month ago for 3 days, vomiting, and kink in upper intestine  . History of hiatal hernia    ?  Marland Kitchen  Hypertension   . Osteoporosis   . PE (pulmonary thromboembolism) ([place])   . 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 Vertigo     Patient Active Problem List   Diagnosis Date Noted  . Truncal obesity 08/04/2020  . Latex precautions, history of latex allergy 06/24/2020  . Hyperglycemia 06/24/2020  . Uncomplicated opioid dependence ([place]) 06/15/2020  . Spasm of muscle of lower back 01/29/2020  . Abnormal MRI, lumbar spine (08/09/2019) 08/14/2019  . Spondylolisthesis, lumbosacral region 07/26/2019  . 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  . Acute postoperative pain 07/10/2019  . Chronic sacroiliac joint pain (Left) 02/28/2019  . Preoperative testing 10/23/2018  . Small bowel obstruction due to adhesions ([place]) 08/24/2018  . Coccygodynia 05/09/2018  . Acute deep vein thrombosis (DVT) of distal end of right lower extremity ([place]) 05/08/2018  . DDD (degenerative disc disease), lumbosacral 04/11/2018  . Chronic anticoagulation (COUMADIN) 04/11/2018  . Morbid obesity with BMI of 40.0-44.9, adult ([place]) 03/15/2018  . Secondary osteoarthritis of multiple sites 03/15/2018  . Sacral insufficiency fracture 03/15/2018  . Chronic low back pain (1ry area of Pain) (Bilateral) w/o sciatica 12/14/2017  . Spondylosis without myelopathy or radiculopathy, lumbosacral region 12/14/2017  . Other specified dorsopathies, sacral and sacrococcygeal region 12/14/2017  . History of allergy  to latex 11/09/2017  . Hyponatremia 11/02/2017  . Hypocalcemia 11/02/2017  . Grade 1-2 Anterolisthesis of L4/L5 & L5/S1 11/02/2017  . Lumbar foraminal stenosis (L3-4 and L4-5) (Bilateral) 11/02/2017  . Lumbosacral L5-S1 subarticular lateral recess stenosis (Bilateral) 11/02/2017  . Lumbar facet arthropathy (L3-4, L4-5, and L5-S1) (Bilateral) 11/02/2017  . Lumbar facet syndrome (Bilateral) 11/02/2017  . 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 Lumbar pars defect (L3 and L4) (Bilateral)  11/02/2017  . Diabetic peripheral neuropathy ([place]) 11/02/2017  . Chronic lower extremity pain (2ry area of Pain) (Bilateral) 11/02/2017  . Chronic radicular pain of lower extremity 11/02/2017  . Osteoarthritis of hip (Right) 11/02/2017  . Chronic low back pain (1ry area of Pain) (Bilateral) w/ sciatica (Bilateral) 10/13/2017  . Chronic sacroiliac joint pain (Right) 10/13/2017  . Chronic pain syndrome 10/13/2017  . Long term current use of opiate analgesic 10/13/2017  . Pharmacologic therapy 10/13/2017  . Disorder of skeletal system 10/13/2017  . Problems influencing health status 10/13/2017  . Diverticulosis of colon 06/20/2017  . History of pulmonary embolism 06/20/2017  . S/P IVC filter 06/20/2017  . H/O ileostomy 06/01/2017  . Positive result for methicillin resistant Staphylococcus aureus (MRSA) screening 05/11/2017  . Ileostomy in place [facility]) 04/08/2017  . History of colostomy reversal 02/24/2017  . Other pulmonary embolism without acute cor pulmonale ([place]) 11/23/2016  . Acute on chronic systolic (congestive) heart failure ([place]) 11/18/2016  . HCAP (healthcare-associated pneumonia) 11/18/2016  . Encounter for removal of staples   . Edema of both legs 11/15/2016  . Severe obesity (BMI 35.0-39.9) with comorbidity ([place]) 11/15/2016  . Diverticulitis 10/03/2016  . Diverticulitis large intestine 09/29/2016  . Benign essential hypertension 08/26/2016  . DDD (degenerative disc disease), lumbar 07/14/2016  . Acute non-recurrent maxillary sinusitis 05/21/2016  . Chronic hip pain (Left) 02/23/2016  . Cough 07/30/2015  . Nocturnal leg cramps 02/20/2015  . Diabetes mellitus type 2 in obese ([place]) 11/12/2014  . Type 2 diabetes mellitus with hyperglycemia, without long-term current use of insulin ([place]) 11/12/2014  . BP (high blood pressure) 11/12/2014    Past Surgical History:  Procedure Laterality Date  . APPENDECTOMY N/A 02/24/2017   Procedure: Incidental  APPENDECTOMY;  Surgeon:  [name], MD;  Location: [name] ORS;  Service: General;  Laterality: N/A;  . CARPAL TUNNEL RELEASE Bilateral   . CATARACT EXTRACTION W/PHACO Left 11/21/2018   Procedure: CATARACT EXTRACTION PHACO AND INTRAOCULAR LENS PLACEMENT ([place]) LEFT DIABETES;  Surgeon: Birder Robson, MD;  Location: [place];  Service: Ophthalmology;  Laterality: Left;  latex sensitivity Diabetic - oral meds  . CATARACT EXTRACTION W/PHACO Right 12/12/2018   Procedure: CATARACT EXTRACTION PHACO AND INTRAOCULAR LENS PLACEMENT ([place]) RIGHT DIABETES;  Surgeon: Birder Robson, MD;  Location: [place];  Service: Ophthalmology;  Laterality: Right;  Diabetes-oral med Latex sensitiviy  . COLON RESECTION SIGMOID N/A 11/02/2016   Procedure: COLON RESECTION SIGMOID;  Surgeon: [name], MD;  Location: [name];  Service: General;  Laterality: N/A;  . COLON SURGERY  11/02/2016  . COLONOSCOPY WITH PROPOFOL N/A 02/03/2017   Procedure: COLONOSCOPY WITH PROPOFOL;  Surgeon: Lin Landsman, MD;  Location: [facility];  Service: Gastroenterology;  Laterality: N/A;  . COLOSTOMY N/A 11/02/2016   Procedure: COLOSTOMY;  Surgeon: [name], MD;  Location: [name];  Service: General;  Laterality: N/A;  . COLOSTOMY REVERSAL N/A 02/24/2017   Procedure: COLOSTOMY REVERSAL, ostomy takedown, spleenic flexure mobilization, excision rectal stump/distal sigmoid, anastomosis with suture reinforcement;  Surgeon: Adonis Huguenin,  Juanda Crumble, MD;  Location: [name];  Service: General;  Laterality: N/A;  . ILEO LOOP DIVERSION N/A 02/24/2017   Procedure: ILEO LOOP COLOSTOMY;  Surgeon: [name], MD;  Location: [name];  Service: General;  Laterality: N/A;  . ILEOSTOMY CLOSURE N/A 06/01/2017   Procedure: LOOP ILEOSTOMY TAKEDOWN;  Surgeon: [name], MD;  Location: [name];  Service: General;  Laterality: N/A;  . IVC FILTER INSERTION Right 10/2016  . IVC FILTER REMOVAL N/A 08/09/2017   Procedure: IVC FILTER  REMOVAL;  Surgeon: Katha Cabal, MD;  Location: [place] [facility];  Service: Cardiovascular;  Laterality: N/A;  . KNEE SURGERY     torn menicus  . LAPAROSCOPY N/A 02/24/2017   Procedure: LAPAROSCOPY DIAGNOSTIC;  Surgeon: [name], MD;  Location: [name];  Service: General;  Laterality: N/A;  . LYSIS OF ADHESION N/A 02/24/2017   Procedure: LYSIS OF ADHESION;  Surgeon: [name], MD;  Location: [name];  Service: General;  Laterality: N/A;  . PULMONARY VENOGRAPHY N/A 11/19/2016   Procedure: Pulmonary Venography; IVC filter placement; possible pulmonary thrombectomy;  Surgeon: Katha Cabal, MD;  Location: [place] [facility];  Service: Cardiovascular;  Laterality: N/A;  . SACROPLASTY N/A 11/08/2017   Procedure: SACROPLASTY S2;  Surgeon: Hessie Knows, MD;  Location: [name];  Service: Orthopedics;  Laterality: N/A;  . SIGMOIDOSCOPY N/A 11/13/2016   Procedure: endoscopic  flexible SIGMOIDOSCOPY;  Surgeon: Florene Glen, MD;  Location: [name];  Service: General;  Laterality: N/A;  . SIGMOIDOSCOPY N/A 05/19/2017   Procedure: Beryle Quant;  Surgeon: [name], MD;  Location: [name];  Service: General;  Laterality: N/A;    Prior to Admission medications   Medication Sig Start Date End Date Taking? Authorizing Provider  aspirin EC 81 MG tablet Take 81 mg by mouth daily. am    [provider]  calcium carbonate (CALCIUM 600) 600 MG TABS tablet Take 1 tablet (600 mg total) by mouth 2 (two) times daily with a meal. 02/25/20 08/23/20  Milinda Pointer, MD  Calcium Polycarbophil (FIBER-CAPS PO) Take by mouth. am    [provider]  candesartan (ATACAND) 16 MG tablet Take 16 mg by mouth daily. am 08/03/17   [provider]  Cholecalciferol (VITAMIN D3) 25 MCG (1000 UT) CAPS Take by mouth daily.    [provider]  cyclobenzaprine (FLEXERIL) 10 MG tablet Take 10 mg by mouth at bedtime. 07/05/16   [provider]   Dulaglutide (TRULICITY [place]) Inject into the skin.    [provider]  gabapentin (NEURONTIN) 100 MG capsule Take 100 mg by mouth 3 (three) times daily. Am,dinner,bedtime    [provider]  gabapentin (NEURONTIN) 300 MG capsule Take 300 mg by mouth at bedtime.    [provider]  glipiZIDE (GLUCOTROL XL) 10 MG 24 hr tablet Take 10 mg by mouth daily. 01/31/20   [provider]  glipiZIDE (GLUCOTROL XL) 5 MG 24 hr tablet Take 15 mg by mouth daily with breakfast.  10/27/16   [provider]  HYDROcodone-acetaminophen (NORCO/VICODIN) 5-325 MG tablet Take 1 tablet by mouth at bedtime. Must last 30 days. 07/18/20 08/17/20  Milinda Pointer, MD  HYDROcodone-acetaminophen (NORCO/VICODIN) 5-325 MG tablet Take 1 tablet by mouth at bedtime. Must last 30 days. 08/17/20 09/16/20  Milinda Pointer, MD  HYDROcodone-acetaminophen (NORCO/VICODIN) 5-325 MG tablet Take 1 tablet by mouth at bedtime. Must last 30 days. 09/16/20 10/16/20  Milinda Pointer, MD  meclizine (ANTIVERT) 25 MG tablet Take 25 mg  by mouth daily. am 12/29/17   [provider]  Multiple Vitamin (MULTI-VITAMINS) TABS Take 1 tablet by mouth daily.    [provider]  nystatin cream (MYCOSTATIN) Apply 1 application topically 3 (three) times daily. 09/06/19   [provider]  ondansetron (ZOFRAN ODT) 4 MG disintegrating tablet Take 1 tablet (4 mg total) by mouth every 8 (eight) hours as needed for nausea or vomiting. 07/03/20   Merlyn Lot, MD  rOPINIRole (REQUIP) 0.5 MG tablet Take 0.5 mg by mouth at bedtime.    [provider]  tolterodine (DETROL) 2 MG tablet Take 2 mg by mouth 2 (two) times daily. 11/24/19   [provider]  traMADol (ULTRAM) 50 MG tablet Take 1 tablet (50 mg total) by mouth every 8 (eight) hours as needed for severe pain. Must last 30 days 08/01/20 10/30/20  Milinda Pointer, MD  vitamin B-12 (CYANOCOBALAMIN) 500 MCG tablet Take 500 mcg by mouth.     [provider]  warfarin (COUMADIN) 1 MG tablet Take 5 mg by mouth daily. am 10/17/18   [provider]  warfarin (COUMADIN) 6 MG tablet Take 6 mg by mouth daily.    [provider]    Allergies Adhesive [tape], Other, Latex, and Morphine and related  Family History  Problem Relation Age of Onset  . Diabetes Mother   . Heart disease Mother   . Cancer Mother   . Breast cancer Mother        >50  . Heart disease Father   . Diabetes Sister   . Heart disease Sister     Social History Social History   Tobacco Use  . Smoking status: Never Smoker  . Smokeless tobacco: Never Used  Vaping Use  . Vaping Use: Never used  Substance Use Topics  . Alcohol use: No    Alcohol/week: 0.0 standard drinks  . Drug use: No    Review of Systems Constitutional: No fever/chills Eyes: No visual changes. ENT: No sore throat. Cardiovascular: Denies chest pain. Respiratory: Denies shortness of breath. Gastrointestinal: Rumbling in her abdomen, positive for profuse diarrhea. Genitourinary: Negative for dysuria. Musculoskeletal: Negative for neck pain.  Negative for back pain. Integumentary: Negative for rash. Neurological: Negative for headaches, focal weakness or numbness.   ____________________________________________   PHYSICAL EXAM:  VITAL SIGNS: ED Triage Vitals  Enc Vitals Group     BP 08/11/20 0134 125/71     Pulse Rate 08/11/20 0134 87     Resp --      Temp 08/11/20 0134 97.6 F (36.4 C)     Temp Source 08/11/20 0134 Oral     SpO2 08/11/20 0134 97 %     Weight 08/11/20 0136 94.3 kg (208 lb)     Height 08/11/20 0136 1.6 m (5\' 3" )     Head Circumference --      Peak Flow --      Pain Score 08/11/20 0136 10     Pain Loc --      Pain Edu? --      Excl. in [place]? --     Constitutional: Alert and oriented.  Eyes: Conjunctivae are normal.  Head: Atraumatic. Nose: No congestion/rhinnorhea. Mouth/Throat: Patient is wearing a mask. Neck: No  stridor.  No meningeal signs.   Cardiovascular: Normal rate, regular rhythm. Good peripheral circulation. Respiratory: Normal respiratory effort.  No retractions. Gastrointestinal: Obese.  Soft and nontender. No distention.  Musculoskeletal: No lower extremity tenderness nor edema. No gross deformities of extremities. Neurologic:  Normal speech and language. No gross focal neurologic deficits are appreciated.  Skin:  Skin is warm, dry and intact. Psychiatric: Mood and affect are normal. Speech and behavior are normal.  ____________________________________________   LABS (all labs ordered are listed, but only abnormal results are displayed)  Labs Reviewed  COMPREHENSIVE METABOLIC PANEL - Abnormal; Notable for the following components:      Result Value   CO2 20 (*)    Glucose, Bld 230 (*)    ALT 48 (*)    All other components within normal limits  C DIFFICILE QUICK SCREEN W PCR REFLEX  GASTROINTESTINAL PANEL BY PCR, STOOL (REPLACES STOOL CULTURE)  LIPASE, BLOOD  CBC  URINALYSIS, COMPLETE (UACMP) WITH MICROSCOPIC   ____________________________________________  EKG  No indication for emergent EKG ____________________________________________  RADIOLOGY  No indication for emergent imaging ____________________________________________   PROCEDURES   Procedure(s) performed (including Critical Care):  Procedures   ____________________________________________   INITIAL IMPRESSION / MDM / [address] / ED COURSE  As part of my medical decision making, I reviewed the following data within the [place] notes reviewed and incorporated, Labs reviewed , Old chart reviewed and Notes from prior ED visits   Differential diagnosis includes, but is not limited to, nonspecific viral colitis, bacterial colitis including the possibility of C. difficile, megacolon, electrolyte or metabolic abnormality, medication side effect.  The patient's vital  signs are stable and within normal limits.  She has no respiratory symptoms.  No tenderness to palpation of her abdomen.  Comprehensive metabolic panel and lipase are both within normal limits other than very slight decrease of CO2 and mild hyperglycemia.  Her CBC is normal with no leukocytosis.  She is having no urinary symptoms.  She is not having any nausea nor vomiting and the diarrhea has started to improve.  I ordered C. difficile and GI pathogen testing given her description of the symptoms and how acute and severe they were earlier.  She produced a sample which strongly suggested C. difficile based on the appearance.  I anticipate that she will be able to be managed as an outpatient unless she unexpectedly decompensates.  She agrees with plan.       Clinical Course as of 08/11/20 0736  Mon Aug 11, 2020  0732 C Difficile Quick Screen w PCR reflex C. difficile testing is negative.  Gastrointestinal panel is still pending but can be followed up as an outpatient.  I have given the patient and she has had no additional bowel movements since providing the specimen for testing.  I explained that even if she has a positive result on the GI pathogen panel it will not require antibiotics and she understands.  She feels ready to go home.  I gave my usual customary management recommendations and return precautions. [CF]    Clinical Course User Index [CF] Hinda Kehr, MD     ____________________________________________  FINAL CLINICAL IMPRESSION(S) / ED DIAGNOSES  Final diagnoses:  Diarrhea, unspecified type     MEDICATIONS GIVEN DURING THIS VISIT:  Medications - No data to display   ED Discharge Orders    None      *Please note:  Kara [name] was evaluated in Emergency Department on 08/11/2020 for the symptoms described in the history of present illness. She was evaluated in the context of the global COVID-19 pandemic, which necessitated consideration that the patient  might be at risk for infection with the SARS-CoV-2 virus that causes COVID-19. Institutional protocols and  algorithms that pertain to the evaluation of patients at risk for COVID-19 are in a state of rapid change based on information released by regulatory bodies including the CDC and federal and state organizations. These policies and algorithms were followed during the patient's care in the ED.  Some ED evaluations and interventions may be delayed as a result of limited staffing during and after the pandemic.*  Note:  This document was prepared using Dragon voice recognition software and may include unintentional dictation errors.   Hinda Kehr, MD 08/11/20 2419    Hinda Kehr, MD 08/11/20 [phone]

## 2020-08-11 NOTE — ED Notes (Signed)
Pt provided with ice chips. Pt watching tv in no acute distress.

## 2020-08-12 DIAGNOSIS — I10 Essential (primary) hypertension: Secondary | ICD-10-CM | POA: Diagnosis not present

## 2020-08-12 DIAGNOSIS — R531 Weakness: Secondary | ICD-10-CM | POA: Diagnosis not present

## 2020-08-12 DIAGNOSIS — R197 Diarrhea, unspecified: Secondary | ICD-10-CM | POA: Diagnosis not present

## 2020-08-12 DIAGNOSIS — E1165 Type 2 diabetes mellitus with hyperglycemia: Secondary | ICD-10-CM | POA: Diagnosis not present

## 2020-08-14 ENCOUNTER — Emergency Department: Payer: PPO

## 2020-08-14 ENCOUNTER — Other Ambulatory Visit: Payer: Self-pay

## 2020-08-14 ENCOUNTER — Observation Stay
Admission: EM | Admit: 2020-08-14 | Discharge: 2020-08-15 | Disposition: A | Discharge status: Home / Self Care | Payer: PPO | Attending: Internal Medicine | Admitting: Internal Medicine

## 2020-08-14 DIAGNOSIS — I959 Hypotension, unspecified: Secondary | ICD-10-CM

## 2020-08-14 DIAGNOSIS — E1165 Type 2 diabetes mellitus with hyperglycemia: Secondary | ICD-10-CM

## 2020-08-14 DIAGNOSIS — A0839 Other viral enteritis: Secondary | ICD-10-CM | POA: Diagnosis not present

## 2020-08-14 DIAGNOSIS — Z7984 Long term (current) use of oral hypoglycemic drugs: Secondary | ICD-10-CM | POA: Insufficient documentation

## 2020-08-14 DIAGNOSIS — I5023 Acute on chronic systolic (congestive) heart failure: Secondary | ICD-10-CM | POA: Diagnosis not present

## 2020-08-14 DIAGNOSIS — E669 Obesity, unspecified: Secondary | ICD-10-CM | POA: Diagnosis present

## 2020-08-14 DIAGNOSIS — E1169 Type 2 diabetes mellitus with other specified complication: Secondary | ICD-10-CM | POA: Diagnosis present

## 2020-08-14 DIAGNOSIS — R531 Weakness: Secondary | ICD-10-CM | POA: Diagnosis not present

## 2020-08-14 DIAGNOSIS — E8881 Metabolic syndrome: Secondary | ICD-10-CM | POA: Diagnosis present

## 2020-08-14 DIAGNOSIS — U071 COVID-19: Secondary | ICD-10-CM | POA: Diagnosis not present

## 2020-08-14 DIAGNOSIS — G894 Chronic pain syndrome: Secondary | ICD-10-CM | POA: Diagnosis present

## 2020-08-14 DIAGNOSIS — Z7901 Long term (current) use of anticoagulants: Secondary | ICD-10-CM

## 2020-08-14 DIAGNOSIS — N289 Disorder of kidney and ureter, unspecified: Secondary | ICD-10-CM | POA: Diagnosis not present

## 2020-08-14 DIAGNOSIS — Z86711 Personal history of pulmonary embolism: Secondary | ICD-10-CM | POA: Diagnosis present

## 2020-08-14 DIAGNOSIS — Z7982 Long term (current) use of aspirin: Secondary | ICD-10-CM | POA: Insufficient documentation

## 2020-08-14 DIAGNOSIS — K921 Melena: Secondary | ICD-10-CM | POA: Diagnosis not present

## 2020-08-14 DIAGNOSIS — I110 Hypertensive heart disease with heart failure: Secondary | ICD-10-CM | POA: Insufficient documentation

## 2020-08-14 DIAGNOSIS — I10 Essential (primary) hypertension: Secondary | ICD-10-CM | POA: Diagnosis present

## 2020-08-14 DIAGNOSIS — E1142 Type 2 diabetes mellitus with diabetic polyneuropathy: Secondary | ICD-10-CM | POA: Diagnosis present

## 2020-08-14 DIAGNOSIS — Z9889 Other specified postprocedural states: Secondary | ICD-10-CM | POA: Diagnosis present

## 2020-08-14 DIAGNOSIS — E860 Dehydration: Secondary | ICD-10-CM | POA: Diagnosis not present

## 2020-08-14 DIAGNOSIS — R109 Unspecified abdominal pain: Secondary | ICD-10-CM | POA: Diagnosis not present

## 2020-08-14 DIAGNOSIS — Z79899 Other long term (current) drug therapy: Secondary | ICD-10-CM | POA: Insufficient documentation

## 2020-08-14 DIAGNOSIS — R197 Diarrhea, unspecified: Secondary | ICD-10-CM | POA: Diagnosis not present

## 2020-08-14 DIAGNOSIS — F1120 Opioid dependence, uncomplicated: Secondary | ICD-10-CM | POA: Diagnosis present

## 2020-08-14 DIAGNOSIS — K529 Noninfective gastroenteritis and colitis, unspecified: Secondary | ICD-10-CM | POA: Diagnosis not present

## 2020-08-14 DIAGNOSIS — E65 Localized adiposity: Secondary | ICD-10-CM | POA: Diagnosis present

## 2020-08-14 DIAGNOSIS — G4762 Sleep related leg cramps: Secondary | ICD-10-CM | POA: Diagnosis present

## 2020-08-14 DIAGNOSIS — E119 Type 2 diabetes mellitus without complications: Secondary | ICD-10-CM | POA: Diagnosis not present

## 2020-08-14 DIAGNOSIS — N179 Acute kidney failure, unspecified: Secondary | ICD-10-CM

## 2020-08-14 DIAGNOSIS — Z91040 Latex allergy status: Secondary | ICD-10-CM | POA: Diagnosis not present

## 2020-08-14 LAB — URINALYSIS, COMPLETE (UACMP) WITH MICROSCOPIC
Bilirubin Urine: NEGATIVE
Glucose, UA: NEGATIVE mg/dL
Hgb urine dipstick: NEGATIVE
Ketones, ur: 5 mg/dL — AB
Nitrite: POSITIVE — AB
Protein, ur: 30 mg/dL — AB
Specific Gravity, Urine: 1.013 (ref 1.005–1.030)
pH: 5 (ref 5.0–8.0)

## 2020-08-14 LAB — COMPREHENSIVE METABOLIC PANEL
ALT: 39 U/L (ref 0–44)
AST: 34 U/L (ref 15–41)
Albumin: 3.5 g/dL (ref 3.5–5.0)
Alkaline Phosphatase: 40 U/L (ref 38–126)
Anion gap: 8 (ref 5–15)
BUN: 19 mg/dL (ref 8–23)
CO2: 19 mmol/L — ABNORMAL LOW (ref 22–32)
Calcium: 8.3 mg/dL — ABNORMAL LOW (ref 8.9–10.3)
Chloride: 107 mmol/L (ref 98–111)
Creatinine, Ser: 1.38 mg/dL — ABNORMAL HIGH (ref 0.44–1.00)
GFR, Estimated: 41 mL/min — ABNORMAL LOW (ref >60)
Glucose, Bld: 211 mg/dL — ABNORMAL HIGH (ref 70–99)
Potassium: 3.8 mmol/L (ref 3.5–5.1)
Sodium: 134 mmol/L — ABNORMAL LOW (ref 135–145)
Total Bilirubin: 0.9 mg/dL (ref 0.3–1.2)
Total Protein: 6.9 g/dL (ref 6.5–8.1)

## 2020-08-14 LAB — MAGNESIUM: Magnesium: 1.7 mg/dL (ref 1.7–2.4)

## 2020-08-14 LAB — TROPONIN I (HIGH SENSITIVITY)
Troponin I (High Sensitivity): 4 ng/L (ref <18)
Troponin I (High Sensitivity): 3 ng/L (ref <18)

## 2020-08-14 LAB — PROTIME-INR
INR: 1.6 — ABNORMAL HIGH (ref 0.8–1.2)
Prothrombin Time: 18.9 seconds — ABNORMAL HIGH (ref 11.4–15.2)

## 2020-08-14 LAB — CBC
HCT: 43.2 % (ref 36.0–46.0)
Hemoglobin: 14.3 g/dL (ref 12.0–15.0)
MCH: 28.9 pg (ref 26.0–34.0)
MCHC: 33.1 g/dL (ref 30.0–36.0)
MCV: 87.3 fL (ref 80.0–100.0)
Platelets: 248 10*3/uL (ref 150–400)
RBC: 4.95 MIL/uL (ref 3.87–5.11)
RDW: 14.1 % (ref 11.5–15.5)
WBC: 6.9 10*3/uL (ref 4.0–10.5)
nRBC: 0 % (ref 0.0–0.2)

## 2020-08-14 LAB — RESP PANEL BY RT-PCR (FLU A&B, COVID) ARPGX2
Influenza A by PCR: NEGATIVE
Influenza B by PCR: NEGATIVE
SARS Coronavirus 2 by RT PCR: POSITIVE — AB

## 2020-08-14 LAB — GLUCOSE, CAPILLARY: Glucose-Capillary: 165 mg/dL — ABNORMAL HIGH (ref 70–99)

## 2020-08-14 LAB — HIV ANTIBODY (ROUTINE TESTING W REFLEX): HIV Screen 4th Generation wRfx: NONREACTIVE

## 2020-08-14 MED ORDER — ZINC SULFATE 220 (50 ZN) MG PO CAPS
220.0000 mg | ORAL_CAPSULE | Freq: Every day | ORAL | Status: DC
  Administered 2020-08-14 – 2020-08-15 (×2): 220 mg via ORAL
  Filled 2020-08-14 (×2): qty 1

## 2020-08-14 MED ORDER — LOPERAMIDE HCL 2 MG PO CAPS
4.0000 mg | ORAL_CAPSULE | ORAL | Status: DC | PRN
  Administered 2020-08-14: 21:00:00 4 mg via ORAL
  Filled 2020-08-14: qty 2

## 2020-08-14 MED ORDER — ACETAMINOPHEN 325 MG PO TABS: 325.0000 mg | ORAL_TABLET | Freq: Four times a day (QID) | ORAL | Status: DC | PRN

## 2020-08-14 MED ORDER — SODIUM CHLORIDE 0.9 % IV BOLUS
1000.0000 mL | Freq: Once | INTRAVENOUS | Status: AC
  Administered 2020-08-14: 1000 mL via INTRAVENOUS

## 2020-08-14 MED ORDER — ASPIRIN EC 81 MG PO TBEC
81.0000 mg | DELAYED_RELEASE_TABLET | Freq: Every day | ORAL | Status: DC
  Administered 2020-08-15: 81 mg via ORAL
  Filled 2020-08-14: qty 1

## 2020-08-14 MED ORDER — SODIUM CHLORIDE 0.9 % IV SOLN
100.0000 mg | Freq: Every day | INTRAVENOUS | Status: DC
  Filled 2020-08-14: qty 20

## 2020-08-14 MED ORDER — WARFARIN - PHARMACIST DOSING INPATIENT: Freq: Every day | Status: DC

## 2020-08-14 MED ORDER — ONDANSETRON HCL 4 MG/2ML IJ SOLN: 4.0000 mg | Freq: Four times a day (QID) | INTRAMUSCULAR | Status: DC | PRN

## 2020-08-14 MED ORDER — HYDROCODONE-ACETAMINOPHEN 5-325 MG PO TABS: 1.0000 | ORAL_TABLET | Freq: Every evening | ORAL | Status: DC | PRN

## 2020-08-14 MED ORDER — TRAMADOL HCL 50 MG PO TABS: 50.0000 mg | ORAL_TABLET | Freq: Three times a day (TID) | ORAL | Status: DC | PRN

## 2020-08-14 MED ORDER — HYDROCODONE-ACETAMINOPHEN 5-325 MG PO TABS: 1.0000 | ORAL_TABLET | Freq: Every day | ORAL | Status: DC

## 2020-08-14 MED ORDER — ALBUTEROL SULFATE HFA 108 (90 BASE) MCG/ACT IN AERS
2.0000 | INHALATION_SPRAY | Freq: Four times a day (QID) | RESPIRATORY_TRACT | Status: DC | PRN
  Filled 2020-08-14: qty 6.7

## 2020-08-14 MED ORDER — GABAPENTIN 300 MG PO CAPS
300.0000 mg | ORAL_CAPSULE | Freq: Every day | ORAL | Status: DC
  Administered 2020-08-14: 300 mg via ORAL
  Filled 2020-08-14: qty 1

## 2020-08-14 MED ORDER — LOPERAMIDE HCL 2 MG PO CAPS
4.0000 mg | ORAL_CAPSULE | Freq: Once | ORAL | Status: AC
  Administered 2020-08-14: 4 mg via ORAL
  Filled 2020-08-14: qty 2

## 2020-08-14 MED ORDER — ADULT MULTIVITAMIN W/MINERALS CH
1.0000 | ORAL_TABLET | Freq: Every day | ORAL | Status: DC
  Administered 2020-08-15: 09:00:00 1 via ORAL
  Filled 2020-08-14: qty 1

## 2020-08-14 MED ORDER — GABAPENTIN 100 MG PO CAPS
100.0000 mg | ORAL_CAPSULE | Freq: Three times a day (TID) | ORAL | Status: DC
  Administered 2020-08-14 – 2020-08-15 (×2): 100 mg via ORAL
  Filled 2020-08-14 (×3): qty 1

## 2020-08-14 MED ORDER — IOHEXOL 300 MG/ML  SOLN
75.0000 mL | Freq: Once | INTRAMUSCULAR | Status: AC | PRN
  Administered 2020-08-14: 75 mL via INTRAVENOUS

## 2020-08-14 MED ORDER — SODIUM CHLORIDE 0.9 % IV SOLN
200.0000 mg | Freq: Once | INTRAVENOUS | Status: DC
  Filled 2020-08-14: qty 40

## 2020-08-14 MED ORDER — IOHEXOL 9 MG/ML PO SOLN
1000.0000 mL | Freq: Once | ORAL | Status: AC | PRN
  Administered 2020-08-14: 1000 mL via ORAL

## 2020-08-14 MED ORDER — GUAIFENESIN-DM 100-10 MG/5ML PO SYRP: 10.0000 mL | ORAL_SOLUTION | ORAL | Status: DC | PRN

## 2020-08-14 MED ORDER — ROPINIROLE HCL 1 MG PO TABS
0.5000 mg | ORAL_TABLET | Freq: Every day | ORAL | Status: DC
  Administered 2020-08-14: 0.5 mg via ORAL
  Filled 2020-08-14: qty 1

## 2020-08-14 MED ORDER — ENOXAPARIN SODIUM 40 MG/0.4ML ~~LOC~~ SOLN: 40.0000 mg | SUBCUTANEOUS | Status: DC

## 2020-08-14 MED ORDER — ASCORBIC ACID 500 MG PO TABS
500.0000 mg | ORAL_TABLET | Freq: Every day | ORAL | Status: DC
  Administered 2020-08-14 – 2020-08-15 (×2): 500 mg via ORAL
  Filled 2020-08-14 (×2): qty 1

## 2020-08-14 MED ORDER — INSULIN ASPART 100 UNIT/ML ~~LOC~~ SOLN
0.0000 [IU] | Freq: Every day | SUBCUTANEOUS | Status: DC
  Filled 2020-08-14: qty 1

## 2020-08-14 MED ORDER — ENOXAPARIN SODIUM 60 MG/0.6ML ~~LOC~~ SOLN: 0.5000 mg/kg | SUBCUTANEOUS | Status: DC

## 2020-08-14 MED ORDER — INSULIN ASPART 100 UNIT/ML ~~LOC~~ SOLN: 0.0000 [IU] | Freq: Three times a day (TID) | SUBCUTANEOUS | Status: DC

## 2020-08-14 MED ORDER — WARFARIN SODIUM 2.5 MG PO TABS
2.5000 mg | ORAL_TABLET | Freq: Once | ORAL | Status: AC
  Administered 2020-08-14: 19:00:00 2.5 mg via ORAL
  Filled 2020-08-14: qty 1

## 2020-08-14 MED ORDER — LACTATED RINGERS IV SOLN: INTRAVENOUS | Status: DC

## 2020-08-14 MED ORDER — ONDANSETRON HCL 4 MG PO TABS: 4.0000 mg | ORAL_TABLET | Freq: Four times a day (QID) | ORAL | Status: DC | PRN

## 2020-08-14 NOTE — H&P (Signed)
History and Physical   Kara [name] NLZ:[id number] DOB: [birth year] DOA: 08/14/2020  PCP: Kara Harrier, MD  Outpatient Specialists: Dr. Milinda [name], pain medicine Patient coming from: PCP clinic, [facility]  I have personally briefly reviewed patient's old medical records in [place].  Chief Concern: diarrhea   HPI: Kara [name] is a 70 y.o. female with medical history significant for history of pulmonary embolism, on chronic anticoagulation, hypertension, non-insulin-dependent diabetes mellitus, truncal obesity, metabolic syndrome, history of colostomy with reversal, presents to the emergency department for chief concerns diarrhea.  Kara [name] states she has been having diarrhea for approximately 7 days.  She states she has been having watery bowel movements about 5-7 times per day during this time frame. There is a possibility of sick contacts as patient attended her son's wedding.    She denies fever, chills, cough, chest pain, shortness of breath, abdominal pain, dysuria, hematuria.  She did have one episode of bright red blood in her stool on 08/13/2020. She denies changes to her diet.    Of note, patient presented for evaluation at [facility] emergency department on 08/11/2020, C. difficile and GI panel was done and found to be negative at the time.  Patient was discharged from the emergency department.  At this time, Covid test was negative as well.  Social history: She lives at home with her spouse.  She denies tobacco, EtOH, recreational drug use.  She is currently semiretired.  Previously and currently she works as a Building control surveyor.  She has 2 patients at this time.  Vaccination: Patient is vaccinated for COVID-19 including booster per patient.  ROS: Constitutional: no weight change, no fever ENT/Mouth: no sore throat, no rhinorrhea Eyes: no eye pain, no vision changes Cardiovascular: no chest pain, no dyspnea,  no edema, no  palpitations Respiratory: no cough, no sputum, no wheezing Gastrointestinal: no nausea, no vomiting, + diarrhea, no constipation Genitourinary: no urinary incontinence, no dysuria, no hematuria Musculoskeletal: no arthralgias, no myalgias Skin: no skin lesions, no pruritus, Neuro: + weakness, no loss of consciousness, no syncope Psych: no anxiety, no depression, + decrease appetite Heme/Lymph: no bruising, no bleeding  ED Course: Discussed with ED provider, patient requiring hospitalization for weakness, new covid infection, and aki.  Vitals in the emergency department showed a temperature of 97.8, respiration rate of 18-20, initial blood pressure in the emergency department was 75/35 and responsive to 2 L normal saline bolus, to 129/86, satting at 94% on room air.  Labs in the emergency department showed serum sodium of 134, potassium 3.8, chloride 107, bicarb 19, BUN of 19, serum creatinine of 1.38, nonfasting blood glucose of 211, WBC 6.9, hemoglobin 14.3, platelets 248, troponin initially 4, EGFR 41, magnesium 1.7.  She was given normal saline 2 L bolus, loperamide 4 mg per emergency medicine provider.  CT abdomen pelvis with contrast was ordered by emergency medicine provider and read as  Assessment/Plan  Principal Problem:   Gastroenteritis due to COVID-19 virus Active Problems:   Diabetes mellitus type 2 in obese (HCC)   Nocturnal leg cramps   Benign essential hypertension   History of colostomy reversal   History of pulmonary embolism   Chronic pain syndrome   Diabetic peripheral neuropathy (HCC)   Chronic anticoagulation (COUMADIN)   Uncomplicated opioid dependence (HCC)   Truncal obesity   Metabolic syndrome   Generalized weakness   # Gastroenteritis secondary to COVID-19 infection # Hypotensive - secondary to GI loss and responsive to fluids  # Covid infection -  possible sick contacts at son's wedding - IV remdesivir per pharmacy, IV solumedrol 1 g/kg IV q12h  initiated  - Incentive spirometry and flutter valve while awake - Albuterol inhaler 2 puffs prn every 4 hours while awake for shortness of breath and wheezing - Daily labs: CMP, CBC, CRP, D-dimer - Airborne and contact precautions  # Diarrhea - unlikely c diff -We will check for c. Diff and GI panel -Loperamide 4 mg as needed for diarrhea, 2 days ordered  # AKI - suspect secondary to pre-renal loss from diarrhea -Status post 2 L NS bolus per ED provider -LR IVF at 125 ml/hr for one day  -CMP in the AM  # Primary hypertension - candesartan 16 mg daily not resumed due to aki and initially hypotensive  # Complex chronic pain and neuropathy -Resumed home Norco 5 mg daily at bedtime for as needed for moderate and severe pain -Resumed home tramadol 50 mg q8h prn for pain -Gabapentin 100 mg TID and 300 mg qhs resumed  # NIDDM - holding home glipizide -Insulin SSI with HS coverage  # History of PE - resumed warfarin per pharmacy  # Right lower lobe nodularity -CT imaging read as 1.3 x 1.1 cm -Updated patient at bedside regarding this finding, including the size and location -Patient denies recent weight changes -Patient endorses understanding and that she will need her primary care provider to follow-up with a repeat CT scan in 3 to 6 months -Patient to follow-up with complete CT of the chest per primary care provider  Chart reviewed.   DVT prophylaxis: warfarin and ted hose Code Status: Full code Diet: Heart healthy/carb modified Family Communication: No Disposition Plan: Pending clinical course Consults called: None at this time Admission status: Observation, MedSurg, with telemetry  Past Medical History:  Diagnosis Date  . Anginal pain ([place])   . 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 CHF (congestive heart failure) ([place])    followed colon resection, "wouldn't let me get up"  . Diabetes ([place])    type 2  . Diverticulitis   . History of  being hospitalized    1 month ago for 3 days, vomiting, and kink in upper intestine  . History of hiatal hernia    ?  Mar[place] Kitchen Hypertension   . Osteoporosis   . PE (pulmonary thromboembolism) ([place])   . 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 Vertigo    Past Surgical History:  Procedure Laterality Date  . APPENDECTOMY N/A 02/24/2017   Procedure: Incidental  APPENDECTOMY;  Surgeon: [name], MD;  Location: [name];  Service: General;  Laterality: N/A;  . CARPAL TUNNEL RELEASE Bilateral   . CATARACT EXTRACTION W/PHACO Left 11/21/2018   Procedure: CATARACT EXTRACTION PHACO AND INTRAOCULAR LENS PLACEMENT ([place]) LEFT DIABETES;  Surgeon: Birder Robson, MD;  Location: [facility];  Service: Ophthalmology;  Laterality: Left;  latex sensitivity Diabetic - oral meds  . CATARACT EXTRACTION W/PHACO Right 12/12/2018   Procedure: CATARACT EXTRACTION PHACO AND INTRAOCULAR LENS PLACEMENT ([place]) RIGHT DIABETES;  Surgeon: Birder Robson, MD;  Location: [facility];  Service: Ophthalmology;  Laterality: Right;  Diabetes-oral med Latex sensitiviy  . COLON RESECTION SIGMOID N/A 11/02/2016   Procedure: COLON RESECTION SIGMOID;  Surgeon: [name], MD;  Location: [name];  Service: General;  Laterality: N/A;  . COLON SURGERY  11/02/2016  . COLONOSCOPY WITH PROPOFOL N/A 02/03/2017   Procedure: COLONOSCOPY WITH PROPOFOL;  Surgeon: Lin Landsman,  MD;  Location: [name];  Service: Gastroenterology;  Laterality: N/A;  . COLOSTOMY N/A 11/02/2016   Procedure: COLOSTOMY;  Surgeon: [name], MD;  Location: [name];  Service: General;  Laterality: N/A;  . COLOSTOMY REVERSAL N/A 02/24/2017   Procedure: COLOSTOMY REVERSAL, ostomy takedown, spleenic flexure mobilization, excision rectal stump/distal sigmoid, anastomosis with suture reinforcement;  Surgeon: [name], MD;  Location: [name];  Service: General;  Laterality: N/A;  . ILEO LOOP DIVERSION N/A 02/24/2017    Procedure: ILEO LOOP COLOSTOMY;  Surgeon: [name], MD;  Location: [name];  Service: General;  Laterality: N/A;  . ILEOSTOMY CLOSURE N/A 06/01/2017   Procedure: LOOP ILEOSTOMY TAKEDOWN;  Surgeon: [name], MD;  Location: [name];  Service: General;  Laterality: N/A;  . IVC FILTER INSERTION Right 10/2016  . IVC FILTER REMOVAL N/A 08/09/2017   Procedure: IVC FILTER REMOVAL;  Surgeon: Katha Cabal, MD;  Location: [facility];  Service: Cardiovascular;  Laterality: N/A;  . KNEE SURGERY     torn menicus  . LAPAROSCOPY N/A 02/24/2017   Procedure: LAPAROSCOPY DIAGNOSTIC;  Surgeon: [name], MD;  Location: [name];  Service: General;  Laterality: N/A;  . LYSIS OF ADHESION N/A 02/24/2017   Procedure: LYSIS OF ADHESION;  Surgeon: [name], MD;  Location: [name];  Service: General;  Laterality: N/A;  . PULMONARY VENOGRAPHY N/A 11/19/2016   Procedure: Pulmonary Venography; IVC filter placement; possible pulmonary thrombectomy;  Surgeon: Katha Cabal, MD;  Location: [facility];  Service: Cardiovascular;  Laterality: N/A;  . SACROPLASTY N/A 11/08/2017   Procedure: SACROPLASTY S2;  Surgeon: Hessie Knows, MD;  Location: [name];  Service: Orthopedics;  Laterality: N/A;  . SIGMOIDOSCOPY N/A 11/13/2016   Procedure: endoscopic  flexible SIGMOIDOSCOPY;  Surgeon: Florene Glen, MD;  Location: [name];  Service: General;  Laterality: N/A;  . SIGMOIDOSCOPY N/A 05/19/2017   Procedure: FLEXIBLE SIGMOIDOSCOPY;  Surgeon: [name], MD;  Location: [name];  Service: General;  Laterality: N/A;   Social History:  reports that she has never smoked. She has never used smokeless tobacco. She reports that she does not drink alcohol and does not use drugs.  Allergies  Allergen Reactions  . Adhesive [Tape] Other (See Comments)    Pt reports, when removing tape from skin most tapes pull her skin off. Ok to use paper tape  . Other Hives     Chlorhexadine wipes/CHG wipes,   . Latex Rash    Exam gloves/ does not react around elastic and lips don't swell when blowing balloons  . Morphine And Related Nausea And Vomiting   Family History  Problem Relation Age of Onset  . Diabetes Mother   . Heart disease Mother   . Cancer Mother   . Breast cancer Mother        >50  . Heart disease Father   . Diabetes Sister   . Heart disease Sister    Family history: Family history reviewed and not pertinent  Prior to Admission medications   Medication Sig Start Date End Date Taking? Authorizing Provider  aspirin EC 81 MG tablet Take 81 mg by mouth daily. am    [provider]  calcium carbonate (CALCIUM 600) 600 MG TABS tablet Take 1 tablet (600 mg total) by mouth 2 (two) times daily with a meal. 02/25/20 08/23/20  Kara Pointer, MD  Calcium Polycarbophil (FIBER-CAPS PO) Take by mouth. am    [provider]  candesartan (ATACAND) 16 MG tablet  Take 16 mg by mouth daily. am 08/03/17   [provider]  Cholecalciferol (VITAMIN D3) 25 MCG (1000 UT) CAPS Take by mouth daily.    [provider]  cyclobenzaprine (FLEXERIL) 10 MG tablet Take 10 mg by mouth at bedtime. 07/05/16   [provider]  Dulaglutide (TRULICITY [place]) Inject into the skin.    [provider]  gabapentin (NEURONTIN) 100 MG capsule Take 100 mg by mouth 3 (three) times daily. Am,dinner,bedtime    [provider]  gabapentin (NEURONTIN) 300 MG capsule Take 300 mg by mouth at bedtime.    [provider]  glipiZIDE (GLUCOTROL XL) 10 MG 24 hr tablet Take 10 mg by mouth daily. 01/31/20   [provider]  glipiZIDE (GLUCOTROL XL) 5 MG 24 hr tablet Take 15 mg by mouth daily with breakfast.  10/27/16   [provider]  HYDROcodone-acetaminophen (NORCO/VICODIN) 5-325 MG tablet Take 1 tablet by mouth at bedtime. Must last 30 days. 07/18/20 08/17/20  Kara Pointer, MD  HYDROcodone-acetaminophen  (NORCO/VICODIN) 5-325 MG tablet Take 1 tablet by mouth at bedtime. Must last 30 days. 08/17/20 09/16/20  Kara Pointer, MD  HYDROcodone-acetaminophen (NORCO/VICODIN) 5-325 MG tablet Take 1 tablet by mouth at bedtime. Must last 30 days. 09/16/20 10/16/20  Kara Pointer, MD  meclizine (ANTIVERT) 25 MG tablet Take 25 mg by mouth daily. am 12/29/17   [provider]  Multiple Vitamin (MULTI-VITAMINS) TABS Take 1 tablet by mouth daily.    [provider]  nystatin cream (MYCOSTATIN) Apply 1 application topically 3 (three) times daily. 09/06/19   [provider]  ondansetron (ZOFRAN ODT) 4 MG disintegrating tablet Take 1 tablet (4 mg total) by mouth every 8 (eight) hours as needed for nausea or vomiting. 07/03/20   Merlyn Lot, MD  rOPINIRole (REQUIP) 0.5 MG tablet Take 0.5 mg by mouth at bedtime.    [provider]  tolterodine (DETROL) 2 MG tablet Take 2 mg by mouth 2 (two) times daily. 11/24/19   [provider]  traMADol (ULTRAM) 50 MG tablet Take 1 tablet (50 mg total) by mouth every 8 (eight) hours as needed for severe pain. Must last 30 days 08/01/20 10/30/20  Kara Pointer, MD  vitamin B-12 (CYANOCOBALAMIN) 500 MCG tablet Take 500 mcg by mouth.    [provider]  warfarin (COUMADIN) 1 MG tablet Take 5 mg by mouth daily. am 10/17/18   [provider]  warfarin (COUMADIN) 6 MG tablet Take 6 mg by mouth daily.    [provider]   Physical Exam: Vitals:   08/14/20 1330 08/14/20 1400 08/14/20 1430 08/14/20 1500  BP: 106/64 107/67 117/70 129/86  Pulse:  91 88 90  Resp: _0 Temp:      TempSrc:      SpO2: 95% 94% 95% 94%  Weight:      Height:       Constitutional: appears age-appropriate, NAD, calm, comfortable Eyes: PERRL, lids and conjunctivae normal ENMT: Mucous membranes are dry. Posterior pharynx clear of any exudate or lesions. Age-appropriate dentition. Hearing appropriate Neck: normal, supple, no  masses, no thyromegaly Respiratory: clear to auscultation bilaterally, no wheezing, no crackles. Normal respiratory effort. No accessory muscle use.  Cardiovascular: Regular rate and rhythm, no murmurs / rubs / gallops. No extremity edema. 2+ pedal pulses. No carotid bruits.  Abdomen: Obese abdomen, no tenderness, no masses palpated, no hepatosplenomegaly. Bowel sounds positive.  Musculoskeletal: no clubbing / cyanosis. No joint deformity upper and lower  extremities. Good ROM, no contractures, no atrophy. Normal muscle tone.  Skin: no rashes, lesions, ulcers. No induration Neurologic: Sensation intact. Strength 5/5 in all 4.  Psychiatric: Normal judgment and insight. Alert and oriented x 3. Normal mood.   EKG: independently reviewed, showing sinus rhythm with rate of 96, qtc 490  Chest x-ray on Admission: I personally reviewed and I agree with radiologist reading as below.  CT ABDOMEN PELVIS W CONTRAST  Result Date: 08/14/2020 CLINICAL DATA:  Abdominal pain and diarrhea EXAM: CT ABDOMEN AND PELVIS WITH CONTRAST TECHNIQUE: Multidetector CT imaging of the abdomen and pelvis was performed using the standard protocol following bolus administration of intravenous contrast. CONTRAST:  27m OMNIPAQUE IOHEXOL 300 MG/ML  SOLN COMPARISON:  July 03, 2020 CT abdomen and pelvis; chest radiograph August 14, 2020 FINDINGS: Lower chest: On the initial CT slice, there is a nodular opacity in the superior segment right lower lobe measuring 1.3 x 1.1 cm, incompletely visualized. This opacity is not discernible on portable chest radiograph obtained earlier in the day. Lung bases elsewhere are clear. Hepatobiliary: No focal liver lesions are appreciable. There is cholelithiasis. Gallbladder wall is not appreciably thickened. There is no biliary duct dilatation. Pancreas: There is no pancreatic mass or inflammatory focus. Spleen: No splenic lesions are evident. Adrenals/Urinary Tract: Adrenals bilaterally appear  unremarkable. There is no evident renal mass or hydronephrosis on either side. There is no evident renal or ureteral calculus on either side. The urinary bladder is midline with wall thickness within normal limits. Stomach/Bowel: There is no appreciable diverticulitis. There is no appreciable bowel wall or mesenteric thickening. There is a focal midline ventral hernia which contains a loop of jejunum as well as a loop of transverse colon. There is no bowel compromise or bowel obstruction in these areas. No evident bowel obstruction elsewhere. Terminal ileum appears unremarkable. Appendix absent. No periappendiceal region inflammatory change. No evident free air or portal venous air. Vascular/Lymphatic: There is no abdominal aortic aneurysm. There is aortic atherosclerosis. Major venous structures appear patent. There is no evident adenopathy in the abdomen or pelvis. Reproductive: Uterine or adnexal mass lesions are appreciable. Other: No abscess or ascites is evident in the abdomen or pelvis. Musculoskeletal: There is degenerative change in the lower thoracic and lumbar spine regions. Vacuum phenomenon is noted at L4-5 and L5-S1. There is stable 5 mm of anterolisthesis of L4 on L5. There is stable 3 mm of anterolisthesis of L5 on S1. Apparent pars defects at L5 bilaterally and at L4 on the left. Evidence of previous sacroplasty procedure. No blastic or lytic bone lesions. No intramuscular lesions are evident. IMPRESSION: 1. Incomplete visualization of a nodular opacity in the superior segment right lower lobe measuring 1.3 x 1.1 cm. This lesion warrants further assessment with complete chest CT to completely assess this lesion as well as to assess for other potential lesions of this nature. 2.  Cholelithiasis.  No gallbladder wall thickening. 3. There is a midline ventral hernia located approximately 9 cm superior to the umbilicus. This ventral hernia contains a loop of transverse colon as well as a loop of  jejunum. No bowel compromise in areas of protrusion into the hernia. This hernia at its neck measures 6.1 cm from right to left dimension and 6.8 cm from superior to inferior dimension. 4. No evident bowel obstruction or bowel wall thickening. No diverticular disease. No abscess in the abdomen or pelvis. Appendix absent. 5. No renal or ureteral calculus. No hydronephrosis. Urinary bladder wall thickness normal. 6.  Aortic Atherosclerosis (ICD10-I70.0). 7. Extensive arthropathy in the thoracic and lumbar spine with vacuum phenomenon at L4-5 and L5-S1. Spondylolisthesis at L4-5 and L5-S1 is stable. Status post sacroplasty. Electronically Signed   By: Lowella Grip III M.D.   On: 08/14/2020 16:04   DG Chest Portable 1 View  Result Date: 08/14/2020 CLINICAL DATA:  Diarrhea and weakness EXAM: PORTABLE CHEST 1 VIEW COMPARISON:  July 03, 2020 FINDINGS: Lungs are clear. Heart is upper normal in size with pulmonary vascularity normal. No adenopathy. There is aortic atherosclerosis. There is degenerative change in the left acromioclavicular joint, stable. IMPRESSION: Lungs clear. Heart upper normal in size. Aortic Atherosclerosis (ICD10-I70.0). Electronically Signed   By: Lowella Grip III M.D.   On: 08/14/2020 15:24   Labs on Admission: I have personally reviewed following labs  CBC: Recent Labs  Lab 08/11/20 0140 08/14/20 1257  WBC 10.3 6.9  HGB 14.3 14.3  HCT 45.4 43.2  MCV 91.7 87.3  PLT 280 520   Basic Metabolic Panel: Recent Labs  Lab 08/11/20 0140 08/14/20 1257  NA 140 134*  K 3.5 3.8  CL 109 107  CO2 20* 19*  GLUCOSE 230* 211*  BUN 18 19  CREATININE 0.80 1.38*  CALCIUM 9.3 8.3*  MG  --  1.7   GFR: Estimated Creatinine Clearance: 42 mL/min (A) (by C-G formula based on SCr of 1.38 mg/dL (H)).  Liver Function Tests: Recent Labs  Lab 08/11/20 0140 08/14/20 1257  AST 32 34  ALT 48* 39  ALKPHOS 47 40  BILITOT 0.7 0.9  PROT 7.5 6.9  ALBUMIN 4.1 3.5   Recent Labs   Lab 08/11/20 0140  LIPASE 38   Urine analysis:    Component Value Date/Time   COLORURINE AMBER (A) 08/14/2020 1503   APPEARANCEUR CLOUDY (A) 08/14/2020 1503   APPEARANCEUR Clear 05/29/2013 0631   LABSPEC 1.013 08/14/2020 1503   LABSPEC 1.015 05/29/2013 0631   PHURINE 5.0 08/14/2020 1503   GLUCOSEU NEGATIVE 08/14/2020 1503   GLUCOSEU Negative 05/29/2013 0631   HGBUR NEGATIVE 08/14/2020 1503   BILIRUBINUR NEGATIVE 08/14/2020 1503   BILIRUBINUR Negative 05/29/2013 0631   KETONESUR 5 (A) 08/14/2020 1503   PROTEINUR 30 (A) 08/14/2020 1503   NITRITE POSITIVE (A) 08/14/2020 1503   LEUKOCYTESUR LARGE (A) 08/14/2020 1503   LEUKOCYTESUR Negative 05/29/2013 0631   Lavoris Canizales N Tyleek Smick D.O. [facility]  If 7PM-7AM, please contact overnight-coverage provider If 7AM-7PM, please contact day coverage provider [email or web address]  08/14/2020, 4:09 PM

## 2020-08-14 NOTE — ED Triage Notes (Signed)
Pt c/o watery diarrhea since Monday, states she was seen here for the same on Monday , denies abd pain , denies vomiting, pt was sent from [facility] and has resulted Met B and CBC on chart. Pt states "I just feel so drained"

## 2020-08-14 NOTE — Progress Notes (Signed)
[facility] for Warfarin Indication: history of PE  Allergies  Allergen Reactions  . Adhesive [Tape] Other (See Comments)    Pt reports, when removing tape from skin most tapes pull her skin off. Ok to use paper tape  . Other Hives    Chlorhexadine wipes/CHG wipes,   . Latex Rash    Exam gloves/ does not react around elastic and lips don't swell when blowing balloons  . Morphine And Related Nausea And Vomiting    Patient Measurements: Height: 5\' 3"  (160 cm) Weight: 94.3 kg (208 lb) IBW/kg (Calculated) : 52.4 Heparin Dosing Weight:    Vital Signs: Temp: 97.7 F (36.5 C) (03/24 1646) Temp Source: Oral (03/24 1646) BP: 126/55 (03/24 1646) Pulse Rate: 85 (03/24 1646)  Labs: Recent Labs    08/14/20 1257 08/14/20 1503 08/14/20 1704  HGB 14.3  --   --   HCT 43.2  --   --   PLT 248  --   --   LABPROT  --   --  18.9*  INR  --   --  1.6*  CREATININE 1.38*  --   --   TROPONINIHS 4 3  --     Estimated Creatinine Clearance: 42 mL/min (A) (by C-G formula based on SCr of 1.38 mg/dL (H)).  Assessment: Patient is a 70yo female admitted for Covid. Patient was taking Warfarin prior to admission for history of PE. Reviewed outpatient records and it looks like the most recent refill sent to pharmacy was for Warfarin 1mg  daily. INR on admission is subtherapeutic at 1.6.  Goal of Therapy:  INR 2-3 Monitor platelets by anticoagulation protocol: Yes   Plan:  Will order Warfarin 2.5mg  times one dose. Daily INR.  Paulina Fusi, PharmD, BCPS 08/14/2020 5:49 PM

## 2020-08-14 NOTE — Progress Notes (Signed)
Patient: Kara [name]  Service Category: E/M  Provider: Gaspar Cola, MD  DOB: 05-Feb-1951  DOS: 08/19/2020  Location: Office  MRN: [id number]  Setting: Ambulatory outpatient  Referring Provider: Tracie Harrier, MD  Type: Established Patient  Specialty: Interventional Pain Management  PCP: Tracie Harrier, MD  Location: Remote location  Delivery: TeleHealth     Virtual Encounter - Pain Management PROVIDER NOTE: Information contained herein reflects review and annotations entered in association with encounter. Interpretation of such information and data should be left to medically-trained personnel. Information provided to patient can be located elsewhere in the medical record under "Patient Instructions". Document created using STT-dictation technology, any transcriptional errors that may result from process are unintentional.    Contact & Pharmacy Preferred: [phone] Home: [phone] (home) Mobile: [phone] (mobile) E-mail: [email or web address]  [place], [place]. [place] [address] Phone: [phone] Fax: [phone]   Pre-screening  Kara [name] offered "in-person" vs "virtual" encounter. She indicated preferring virtual for this encounter.   Reason COVID-19*  Social distancing based on CDC and AMA recommendations.   I contacted [name] on 08/19/2020 via telephone.      I clearly identified myself as Gaspar Cola, MD. I verified that I was speaking with the correct person using two identifiers (Name: KEBRINA FRIEND, and date of birth: August 21, 1950).  Consent I sought verbal advanced consent from [name] for virtual visit interactions. I informed Kara [name] of possible security and privacy concerns, risks, and limitations associated with providing "not-in-person" medical evaluation and management services. I also informed Kara [name] of the  availability of "in-person" appointments. Finally, I informed her that there would be a charge for the virtual visit and that she could be  personally, fully or partially, financially responsible for it. Kara [name] expressed understanding and agreed to proceed.   Historic Elements   Kara [name] is a 70 y.o. year old, female patient evaluated today after our last contact on 08/05/2020. Kara [name]  has a past medical history of Anginal pain ([place]), Arthritis, CHF (congestive heart failure) (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 She also  has a past surgical history that includes Knee surgery; Carpal tunnel release (Bilateral); Colon resection sigmoid (N/A, 11/02/2016); Colostomy (N/A, 11/02/2016); Sigmoidoscopy (N/A, 11/13/2016); PULMONARY VENOGRAPHY (N/A, 11/19/2016); Colonoscopy with propofol (N/A, 02/03/2017); Colon surgery (11/02/2016); IVC FILTER INSERTION (Right, 10/2016); Colostomy reversal (N/A, 02/24/2017); Appendectomy (N/A, 02/24/2017); Lysis of adhesion (N/A, 02/24/2017); laparoscopy (N/A, 02/24/2017); Ileo loop diversion (N/A, 02/24/2017); Sigmoidoscopy (N/A, 05/19/2017); Ileostomy closure (N/A, 06/01/2017); IVC FILTER REMOVAL (N/A, 08/09/2017); Sacroplasty (N/A, 11/08/2017); Cataract extraction w/PHACO (Left, 11/21/2018); and Cataract extraction w/PHACO (Right, 12/12/2018). Kara [name] has a current medication list which includes the following prescription(s): alendronate, aspirin ec, calcium carbonate, candesartan, cholecalciferol, cyclobenzaprine, dulaglutide, gabapentin, gabapentin, glipizide, hydrocodone-acetaminophen, [START ON 09/16/2020] hydrocodone-acetaminophen, meclizine, multi-vitamins, nystatin cream, ondansetron, ropinirole, tolterodine, tramadol, vitamin b-12, warfarin, and  hydrocodone-acetaminophen. She  reports that she has never smoked. She has never used smokeless tobacco. She reports that she does not drink alcohol and does not use drugs. Kara [name] is allergic to adhesive [tape], other, latex, and morphine and related.   HPI  Today, she is being contacted for a post-procedure assessment.  Patient refers that the lumbar facet block did help considerably that right side.  Unfortunately, a little bit after that she ended  up in the hospital with gastrointestinal COVID where she had severe diarrhea for more than a week.  When she went to the hospital she was tested and according to her she tested positive for COVID-19.  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  We will continue to manage her symptomatically.  Post-Procedure Evaluation  Procedure (08/05/2020): Palliative right lumbar facet block #19 under fluoroscopic guidance and IV sedation Pre-procedure pain level: 9/10 Post-procedure: 1/10 (> 50% relief)  Sedation: Sedation provided.  Effectiveness during initial hour after procedure(Ultra-Short Term Relief): 100 %.  Local anesthetic used: Long-acting (4-6 hours) Effectiveness: Defined as any analgesic benefit obtained secondary to the administration of local anesthetics. This carries significant diagnostic value as to the etiological location, or anatomical origin, of the pain. Duration of benefit is expected to coincide with the duration of the local anesthetic used.  Effectiveness during initial 4-6 hours after procedure(Short-Term Relief): 100 %.  Long-term benefit: Defined as any relief past the pharmacologic duration of the local anesthetics.  Effectiveness past the initial 6 hours after procedure(Long-Term Relief): 80 %.  Current benefits: Defined as benefit that persist at this time.   Analgesia:  Ongoing 80% improvement in the low back pain. Function: Kara [name] reports improvement in  function ROM: Kara [name] reports improvement in ROM  Pharmacotherapy Assessment  Analgesic: Hydrocodone/APAP 5/325, 1 tab PO QD PRN (5 mg/day of hydrocodone) (5 MME) + tramadol 50 mg, 1 tab p.o. every 8 hours (150 mg/day of tramadol) (15 MME) MME: 20 mg/day.   Monitoring: [place] PMP: PDMP reviewed during this encounter.       Pharmacotherapy: No side-effects or adverse reactions reported. Compliance: No problems identified. Effectiveness: Clinically acceptable. Plan: Refer to "POC".  UDS:  Summary  Date Value Ref Range Status  09/20/2019 Note  Final    Comment:    ==================================================================== [employer] Select 13 (MW) ==================================================================== Test                             Result       Flag       Units Drug Absent but Declared for Prescription Verification   Hydrocodone                    Not Detected UNEXPECTED ng/mg creat ==================================================================== Test                      Result    Flag   Units      Ref Range   Creatinine              126              mg/dL      >=20 ==================================================================== Declared Medications:  The flagging and interpretation on this report are based on the  following declared medications.  Unexpected results may arise from  inaccuracies in the declared medications.  **Note: The testing scope of this panel includes these medications:  Hydrocodone  **Note: The testing scope of this panel does not include the  following reported medications:  Acetaminophen  Aspirin  Calcium  Candesartan  Cholecalciferol  Cyanocobalamin  Cyclobenzaprine  Dulaglutide  Gabapentin  Glipizide  Loperamide  Meclizine  Multivitamin  Nystatin  Ropinirole  Warfarin ==================================================================== For clinical consultation, please call [phone]. ====================================================================     Laboratory Chemistry Profile   Renal Lab Results  Component Value  Date   BUN 17 08/15/2020   CREATININE 0.72 08/15/2020   GFRAA >60 07/09/2019   GFRNONAA >60 08/15/2020     Hepatic Lab Results  Component Value Date   AST 22 08/15/2020   ALT 33 08/15/2020   ALBUMIN 3.2 (L) 08/15/2020   ALKPHOS 41 08/15/2020   LIPASE 38 08/11/2020     Electrolytes Lab Results  Component Value Date   NA 137 08/15/2020   K 3.8 08/15/2020   CL 109 08/15/2020   CALCIUM 8.4 (L) 08/15/2020   MG 1.7 08/14/2020   PHOS 3.3 11/07/2016     Bone Lab Results  Component Value Date   25OHVITD1 25 (L) 11/02/2017   25OHVITD2 <1.0 11/02/2017   25OHVITD3 25 11/02/2017     Inflammation (CRP: Acute Phase) (ESR: Chronic Phase) Lab Results  Component Value Date   CRP 1.7 (H) 08/15/2020   ESRSEDRATE 12 11/02/2017       Note: Above Lab results reviewed.  Imaging  CT ABDOMEN PELVIS W CONTRAST CLINICAL DATA:  Abdominal pain and diarrhea  EXAM: CT ABDOMEN AND PELVIS WITH CONTRAST  TECHNIQUE: Multidetector CT imaging of the abdomen and pelvis was performed using the standard protocol following bolus administration of intravenous contrast.  CONTRAST:  66m OMNIPAQUE IOHEXOL 300 MG/ML  SOLN  COMPARISON:  July 03, 2020 CT abdomen and pelvis; chest radiograph August 14, 2020  FINDINGS: Lower chest: On the initial CT slice, there is a nodular opacity in the superior segment right lower lobe measuring 1.3 x 1.1 cm, incompletely visualized. This opacity is not discernible on portable chest radiograph obtained earlier in the day. Lung bases elsewhere are clear.  Hepatobiliary: No focal liver lesions are appreciable. There is cholelithiasis. Gallbladder wall is not appreciably thickened. There is no biliary duct dilatation.  Pancreas: There is no pancreatic mass or inflammatory focus.  Spleen: No splenic  lesions are evident.  Adrenals/Urinary Tract: Adrenals bilaterally appear unremarkable. There is no evident renal mass or hydronephrosis on either side. There is no evident renal or ureteral calculus on either side. The urinary bladder is midline with wall thickness within normal limits.  Stomach/Bowel: There is no appreciable diverticulitis. There is no appreciable bowel wall or mesenteric thickening. There is a focal midline ventral hernia which contains a loop of jejunum as well as a loop of transverse colon. There is no bowel compromise or bowel obstruction in these areas. No evident bowel obstruction elsewhere. Terminal ileum appears unremarkable. Appendix absent. No periappendiceal region inflammatory change. No evident free air or portal venous air.  Vascular/Lymphatic: There is no abdominal aortic aneurysm. There is aortic atherosclerosis. Major venous structures appear patent. There is no evident adenopathy in the abdomen or pelvis.  Reproductive: Uterine or adnexal mass lesions are appreciable.  Other: No abscess or ascites is evident in the abdomen or pelvis.  Musculoskeletal: There is degenerative change in the lower thoracic and lumbar spine regions. Vacuum phenomenon is noted at L4-5 and L5-S1. There is stable 5 mm of anterolisthesis of L4 on L5. There is stable 3 mm of anterolisthesis of L5 on S1. Apparent pars defects at L5 bilaterally and at L4 on the left. Evidence of previous sacroplasty procedure. No blastic or lytic bone lesions. No intramuscular lesions are evident.  IMPRESSION: 1. Incomplete visualization of a nodular opacity in the superior segment right lower lobe measuring 1.3 x 1.1 cm. This lesion warrants further assessment with complete chest CT to completely assess this lesion as well as to assess for other potential  lesions of this nature.  2.  Cholelithiasis.  No gallbladder wall thickening.  3. There is a midline ventral hernia located  approximately 9 cm superior to the umbilicus. This ventral hernia contains a loop of transverse colon as well as a loop of jejunum. No bowel compromise in areas of protrusion into the hernia. This hernia at its neck measures 6.1 cm from right to left dimension and 6.8 cm from superior to inferior dimension.  4. No evident bowel obstruction or bowel wall thickening. No diverticular disease. No abscess in the abdomen or pelvis. Appendix absent.  5. No renal or ureteral calculus. No hydronephrosis. Urinary bladder wall thickness normal.  6.  Aortic Atherosclerosis (ICD10-I70.0).  7. Extensive arthropathy in the thoracic and lumbar spine with vacuum phenomenon at L4-5 and L5-S1. Spondylolisthesis at L4-5 and L5-S1 is stable. Status post sacroplasty.  Electronically Signed   By: Lowella Grip III M.D.   On: 08/14/2020 16:04 DG Chest Portable 1 View CLINICAL DATA:  Diarrhea and weakness  EXAM: PORTABLE CHEST 1 VIEW  COMPARISON:  July 03, 2020  FINDINGS: Lungs are clear. Heart is upper normal in size with pulmonary vascularity normal. No adenopathy. There is aortic atherosclerosis. There is degenerative change in the left acromioclavicular joint, stable.  IMPRESSION: Lungs clear. Heart upper normal in size. Aortic Atherosclerosis (ICD10-I70.0).  Electronically Signed   By: Lowella Grip III M.D.   On: 08/14/2020 15:24  Assessment  The primary encounter diagnosis was Chronic pain syndrome. 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  Plan of Care  Problem-specific:  No problem-specific Assessment & Plan notes found for this encounter.  Kara [name] has a current medication list which includes the following long-term medication(s): calcium carbonate, candesartan, glipizide, hydrocodone-acetaminophen, [START ON 09/16/2020] hydrocodone-acetaminophen, ropinirole, tramadol, warfarin, and hydrocodone-acetaminophen.  Pharmacotherapy (Medications Ordered): No orders of the defined types were placed in this encounter.  Orders:  No orders of the defined types were placed in this encounter.  Follow-up plan:   Return for scheduled encounter.      Interventional Therapies  Risk  Complexity  Complicating Factors:   Estimated body mass index is 36.85 kg/m as calculated from the following:   Height as of 08/14/20: '5\' 3"'  (1.6 m).   Weight as of 08/14/20: 208 lb (94.3 kg). 1.  Coumadin anticoagulation (Stop:5days  Re-start: 2hrs) 2.  Latex allergy  3.  Morbid obesity (class III) (BMI>40)  4.  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   Completed:   Palliative/Therapeutic left L4-5 LESI x1(11/10/2017) Palliative left lumbar facet blockx14(06/24/2020) Palliativeright lumbar facet block(s) x19(08/05/2020) Palliative right SI joint block x8(not as effective as facet Blk) (03/08/2019) Diagnostic left SI joint block x1 (not as effective as facet Blk) (03/08/2019) Palliative/Therapeutic (Midline) caudalESIx3(06/08/2018)   Palliative options:   Palliative/Therapeutic left L4-5 LESI #2 Palliative left lumbar facet block#15 Palliativeright lumbar facet block(s) #19 Palliative right SI joint block #9 Diagnostic left SI joint block #2  Palliative/Therapeutic (Midline) caudalESI#4    Recent  Visits Date Type Provider Dept  08/05/20 Procedure visit Milinda Pointer, MD [name]  08/04/20 Office Visit Milinda Pointer, MD [name]  07/14/20 Office Visit Milinda Pointer, MD [name]  06/24/20 Procedure visit Milinda Pointer, MD [name]  06/18/20 Telemedicine Milinda Pointer, MD [name]  Showing recent visits within past 90 days and meeting all other requirements Today's Visits Date Type Provider Dept  08/19/20 Telemedicine Milinda Pointer, MD [name]  Showing today's visits and meeting all other requirements Future Appointments Date Type Provider Dept  10/15/20 Appointment Milinda Pointer, MD [name]  Showing future appointments within next 90 days and meeting all other requirements  I discussed the assessment and treatment plan with the patient. The patient was provided an opportunity to ask questions and all were answered. The patient agreed with the plan and demonstrated an understanding of the instructions.  Patient advised to call back or seek an in-person evaluation if the symptoms or condition worsens.  Duration of encounter: 15 minutes.  Note by: Gaspar Cola, MD Date: 08/19/2020; Time: 4:06 PM

## 2020-08-14 NOTE — Progress Notes (Signed)
Remdesivir - Pharmacy Brief Note   O:  Hypotension and Diarrhea SARS-CoV-2: positive   A/P:  Remdesivir 200 mg IVPB once followed by 100 mg IVPB daily x 4 days.   Paulina Fusi, PharmD, BCPS 08/14/2020 4:10 PM

## 2020-08-14 NOTE — ED Provider Notes (Signed)
[facility] Emergency Department Provider Note  Time seen: 12:45 PM  I have reviewed the triage vital signs and the nursing notes.   HISTORY  Chief Complaint Hypotension and Diarrhea   HPI Kara [name] is a 70 y.o. female with a past medical history of arthritis, CHF, diabetes, hypertension, presents to the emergency department for generalized weakness and diarrhea.  According to the patient for the past 1 week she has been experiencing diarrhea approximately 5 or 6 times per day.  Patient was seen 3 days ago had a GI panel and C. difficile test performed that was negative per record review.  Patient went to [facility] this morning had lab work performed and was found to be hypotensive with generalized weakness and was sent to the emergency department for evaluation.  Upon arrival to the emergency department patient has a blood pressure of 75/32 currently.  She is awake alert oriented mentating well.   Patient denies any abdominal pain nausea or vomiting.  No dysuria.  Past Medical History:  Diagnosis Date  . Anginal pain ([place])   . 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 CHF (congestive heart failure) ([place])    followed colon resection, "wouldn't let me get up"  . Diabetes ([place])    type 2  . Diverticulitis   . History of being hospitalized    1 month ago for 3 days, vomiting, and kink in upper intestine  . History of hiatal hernia    ?  Marland Kitchen Hypertension   . Osteoporosis   . PE (pulmonary thromboembolism) ([place])   . 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 Vertigo     Patient Active Problem List   Diagnosis Date Noted  . Truncal obesity 08/04/2020  . Latex precautions, history of latex allergy 06/24/2020  . Hyperglycemia 06/24/2020  . Uncomplicated opioid dependence ([place]) 06/15/2020  . Spasm of muscle of lower back 01/29/2020  . Abnormal MRI, lumbar spine (08/09/2019) 08/14/2019  . Spondylolisthesis,  lumbosacral region 07/26/2019  . 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  . Acute postoperative pain 07/10/2019  . Chronic sacroiliac joint pain (Left) 02/28/2019  . Preoperative testing 10/23/2018  . Small bowel obstruction due to adhesions ([place]) 08/24/2018  . Coccygodynia 05/09/2018  . Acute deep vein thrombosis (DVT) of distal end of right lower extremity ([place]) 05/08/2018  . DDD (degenerative disc disease), lumbosacral 04/11/2018  . Chronic anticoagulation (COUMADIN) 04/11/2018  . Morbid obesity with BMI of 40.0-44.9, adult ([place]) 03/15/2018  . Secondary osteoarthritis of multiple sites 03/15/2018  . Sacral insufficiency fracture 03/15/2018  . Chronic low back pain (1ry area of Pain) (Bilateral) w/o sciatica 12/14/2017  . Spondylosis without myelopathy or radiculopathy, lumbosacral region 12/14/2017  . Other specified dorsopathies, sacral and sacrococcygeal region 12/14/2017  . History of allergy to latex 11/09/2017  . Hyponatremia 11/02/2017  . Hypocalcemia 11/02/2017  . Grade 1-2 Anterolisthesis of L4/L5 & L5/S1 11/02/2017  . Lumbar foraminal stenosis (L3-4 and L4-5) (Bilateral) 11/02/2017  . Lumbosacral L5-S1 subarticular lateral recess stenosis (Bilateral) 11/02/2017  . Lumbar facet arthropathy (L3-4, L4-5, and L5-S1) (Bilateral) 11/02/2017  . Lumbar facet syndrome (Bilateral) 11/02/2017  . 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 Lumbar pars defect (L3 and L4) (Bilateral) 11/02/2017  . Diabetic peripheral neuropathy ([place]) 11/02/2017  . Chronic lower extremity pain (2ry area of Pain) (Bilateral) 11/02/2017  . Chronic radicular pain of lower extremity 11/02/2017  . Osteoarthritis of  hip (Right) 11/02/2017  . Chronic low back pain (1ry area of Pain) (Bilateral) w/ sciatica (Bilateral) 10/13/2017  . Chronic sacroiliac joint pain (Right) 10/13/2017  . Chronic pain syndrome 10/13/2017  . Long term current use of opiate analgesic 10/13/2017  .  Pharmacologic therapy 10/13/2017  . Disorder of skeletal system 10/13/2017  . Problems influencing health status 10/13/2017  . Diverticulosis of colon 06/20/2017  . History of pulmonary embolism 06/20/2017  . S/P IVC filter 06/20/2017  . H/O ileostomy 06/01/2017  . Positive result for methicillin resistant Staphylococcus aureus (MRSA) screening 05/11/2017  . Ileostomy in place [facility]) 04/08/2017  . History of colostomy reversal 02/24/2017  . Other pulmonary embolism without acute cor pulmonale ([place]) 11/23/2016  . Acute on chronic systolic (congestive) heart failure ([place]) 11/18/2016  . HCAP (healthcare-associated pneumonia) 11/18/2016  . Encounter for removal of staples   . Edema of both legs 11/15/2016  . Severe obesity (BMI 35.0-39.9) with comorbidity ([place]) 11/15/2016  . Diverticulitis 10/03/2016  . Diverticulitis large intestine 09/29/2016  . Benign essential hypertension 08/26/2016  . DDD (degenerative disc disease), lumbar 07/14/2016  . Acute non-recurrent maxillary sinusitis 05/21/2016  . Chronic hip pain (Left) 02/23/2016  . Cough 07/30/2015  . Nocturnal leg cramps 02/20/2015  . Diabetes mellitus type 2 in obese ([place]) 11/12/2014  . Type 2 diabetes mellitus with hyperglycemia, without long-term current use of insulin ([place]) 11/12/2014  . BP (high blood pressure) 11/12/2014    Past Surgical History:  Procedure Laterality Date  . APPENDECTOMY N/A 02/24/2017   Procedure: Incidental  APPENDECTOMY;  Surgeon: [name], MD;  Location: [name] ORS;  Service: General;  Laterality: N/A;  . CARPAL TUNNEL RELEASE Bilateral   . CATARACT EXTRACTION W/PHACO Left 11/21/2018   Procedure: CATARACT EXTRACTION PHACO AND INTRAOCULAR LENS PLACEMENT ([place]) LEFT DIABETES;  Surgeon: Birder Robson, MD;  Location: [facility];  Service: Ophthalmology;  Laterality: Left;  latex sensitivity Diabetic - oral meds  . CATARACT EXTRACTION W/PHACO Right 12/12/2018   Procedure: CATARACT EXTRACTION  PHACO AND INTRAOCULAR LENS PLACEMENT ([place]) RIGHT DIABETES;  Surgeon: Birder Robson, MD;  Location: [facility];  Service: Ophthalmology;  Laterality: Right;  Diabetes-oral med Latex sensitiviy  . COLON RESECTION SIGMOID N/A 11/02/2016   Procedure: COLON RESECTION SIGMOID;  Surgeon: [name], MD;  Location: [name];  Service: General;  Laterality: N/A;  . COLON SURGERY  11/02/2016  . COLONOSCOPY WITH PROPOFOL N/A 02/03/2017   Procedure: COLONOSCOPY WITH PROPOFOL;  Surgeon: Lin Landsman, MD;  Location: [facility];  Service: Gastroenterology;  Laterality: N/A;  . COLOSTOMY N/A 11/02/2016   Procedure: COLOSTOMY;  Surgeon: [name], MD;  Location: [name];  Service: General;  Laterality: N/A;  . COLOSTOMY REVERSAL N/A 02/24/2017   Procedure: COLOSTOMY REVERSAL, ostomy takedown, spleenic flexure mobilization, excision rectal stump/distal sigmoid, anastomosis with suture reinforcement;  Surgeon: [name], MD;  Location: [name];  Service: General;  Laterality: N/A;  . ILEO LOOP DIVERSION N/A 02/24/2017   Procedure: ILEO LOOP COLOSTOMY;  Surgeon: [name], MD;  Location: [name];  Service: General;  Laterality: N/A;  . ILEOSTOMY CLOSURE N/A 06/01/2017   Procedure: LOOP ILEOSTOMY TAKEDOWN;  Surgeon: [name], MD;  Location: [name];  Service: General;  Laterality: N/A;  . IVC FILTER INSERTION Right 10/2016  . IVC FILTER REMOVAL N/A 08/09/2017   Procedure: IVC FILTER REMOVAL;  Surgeon: Katha Cabal, MD;  Location: [facility];  Service: Cardiovascular;  Laterality: N/A;  . KNEE SURGERY  torn menicus  . LAPAROSCOPY N/A 02/24/2017   Procedure: LAPAROSCOPY DIAGNOSTIC;  Surgeon: [name], MD;  Location: [name];  Service: General;  Laterality: N/A;  . LYSIS OF ADHESION N/A 02/24/2017   Procedure: LYSIS OF ADHESION;  Surgeon: [name], MD;  Location: [name];  Service: General;  Laterality: N/A;  . PULMONARY  VENOGRAPHY N/A 11/19/2016   Procedure: Pulmonary Venography; IVC filter placement; possible pulmonary thrombectomy;  Surgeon: Katha Cabal, MD;  Location: [facility];  Service: Cardiovascular;  Laterality: N/A;  . SACROPLASTY N/A 11/08/2017   Procedure: SACROPLASTY S2;  Surgeon: Hessie Knows, MD;  Location: [name];  Service: Orthopedics;  Laterality: N/A;  . SIGMOIDOSCOPY N/A 11/13/2016   Procedure: endoscopic  flexible SIGMOIDOSCOPY;  Surgeon: Florene Glen, MD;  Location: [name];  Service: General;  Laterality: N/A;  . SIGMOIDOSCOPY N/A 05/19/2017   Procedure: Beryle Quant;  Surgeon: [name], MD;  Location: [name];  Service: General;  Laterality: N/A;    Prior to Admission medications   Medication Sig Start Date End Date Taking? Authorizing Provider  aspirin EC 81 MG tablet Take 81 mg by mouth daily. am    [provider]  calcium carbonate (CALCIUM 600) 600 MG TABS tablet Take 1 tablet (600 mg total) by mouth 2 (two) times daily with a meal. 02/25/20 08/23/20  Milinda Pointer, MD  Calcium Polycarbophil (FIBER-CAPS PO) Take by mouth. am    [provider]  candesartan (ATACAND) 16 MG tablet Take 16 mg by mouth daily. am 08/03/17   [provider]  Cholecalciferol (VITAMIN D3) 25 MCG (1000 UT) CAPS Take by mouth daily.    [provider]  cyclobenzaprine (FLEXERIL) 10 MG tablet Take 10 mg by mouth at bedtime. 07/05/16   [provider]  Dulaglutide (TRULICITY [place]) Inject into the skin.    [provider]  gabapentin (NEURONTIN) 100 MG capsule Take 100 mg by mouth 3 (three) times daily. Am,dinner,bedtime    [provider]  gabapentin (NEURONTIN) 300 MG capsule Take 300 mg by mouth at bedtime.    [provider]  glipiZIDE (GLUCOTROL XL) 10 MG 24 hr tablet Take 10 mg by mouth daily. 01/31/20   [provider]  glipiZIDE (GLUCOTROL XL) 5 MG 24 hr tablet Take 15 mg by mouth daily  with breakfast.  10/27/16   [provider]  HYDROcodone-acetaminophen (NORCO/VICODIN) 5-325 MG tablet Take 1 tablet by mouth at bedtime. Must last 30 days. 07/18/20 08/17/20  Milinda Pointer, MD  HYDROcodone-acetaminophen (NORCO/VICODIN) 5-325 MG tablet Take 1 tablet by mouth at bedtime. Must last 30 days. 08/17/20 09/16/20  Milinda Pointer, MD  HYDROcodone-acetaminophen (NORCO/VICODIN) 5-325 MG tablet Take 1 tablet by mouth at bedtime. Must last 30 days. 09/16/20 10/16/20  Milinda Pointer, MD  meclizine (ANTIVERT) 25 MG tablet Take 25 mg by mouth daily. am 12/29/17   [provider]  Multiple Vitamin (MULTI-VITAMINS) TABS Take 1 tablet by mouth daily.    [provider]  nystatin cream (MYCOSTATIN) Apply 1 application topically 3 (three) times daily. 09/06/19   [provider]  ondansetron (ZOFRAN ODT) 4 MG disintegrating tablet Take 1 tablet (4 mg total) by mouth every 8 (eight) hours as needed for nausea or vomiting. 07/03/20   Merlyn Lot, MD  rOPINIRole (REQUIP) 0.5 MG tablet Take 0.5 mg by mouth at bedtime.    [provider]  tolterodine (DETROL) 2 MG tablet Take 2 mg by mouth 2 (two) times daily. 11/24/19  [provider]  traMADol (ULTRAM) 50 MG tablet Take 1 tablet (50 mg total) by mouth every 8 (eight) hours as needed for severe pain. Must last 30 days 08/01/20 10/30/20  Milinda Pointer, MD  vitamin B-12 (CYANOCOBALAMIN) 500 MCG tablet Take 500 mcg by mouth.    [provider]  warfarin (COUMADIN) 1 MG tablet Take 5 mg by mouth daily. am 10/17/18   [provider]  warfarin (COUMADIN) 6 MG tablet Take 6 mg by mouth daily.    [provider]    Allergies  Allergen Reactions  . Adhesive [Tape] Other (See Comments)    Pt reports, when removing tape from skin most tapes pull her skin off. Ok to use paper tape  . Other Hives    Chlorhexadine wipes/CHG wipes,   . Latex Rash    Exam gloves/ does not react  around elastic and lips don't swell when blowing balloons  . Morphine And Related Nausea And Vomiting    Family History  Problem Relation Age of Onset  . Diabetes Mother   . Heart disease Mother   . Cancer Mother   . Breast cancer Mother        >50  . Heart disease Father   . Diabetes Sister   . Heart disease Sister     Social History Social History   Tobacco Use  . Smoking status: Never Smoker  . Smokeless tobacco: Never Used  Vaping Use  . Vaping Use: Never used  Substance Use Topics  . Alcohol use: No    Alcohol/week: 0.0 standard drinks  . Drug use: No    Review of Systems Constitutional: Negative for fever.  Positive generalized fatigue/weakness Cardiovascular: Negative for chest pain. Respiratory: Negative for shortness of breath. Gastrointestinal: Negative for abdominal pain.  Negative for nausea vomiting.  Positive for diarrhea x1 week. Genitourinary: Negative for urinary compaints Musculoskeletal: Negative for musculoskeletal complaints Neurological: Negative for headache All other ROS negative  ____________________________________________   PHYSICAL EXAM:  VITAL SIGNS: ED Triage Vitals  Enc Vitals Group     BP 08/14/20 1213 (!) 78/57     Pulse Rate 08/14/20 1213 (!) 103     Resp 08/14/20 1213 20     Temp 08/14/20 1213 97.8 F (36.6 C)     Temp Source 08/14/20 1213 Oral     SpO2 08/14/20 1213 92 %     Weight 08/14/20 1212 208 lb (94.3 kg)     Height 08/14/20 1212 5\' 3"  (1.6 m)     Head Circumference --      Peak Flow --      Pain Score 08/14/20 1221 0     Pain Loc --      Pain Edu? --      Excl. in [place]? --    Constitutional: Alert and oriented. Well appearing and in no distress. Eyes: Normal exam ENT      Head: Normocephalic and atraumatic.      Mouth/Throat: Mucous membranes are moist. Cardiovascular: Normal rate, regular rhythm.  Respiratory: Normal respiratory effort without tachypnea nor retractions. Breath sounds are  clear Gastrointestinal: Soft, mild left lower quadrant tenderness palpation.  No rebound guarding or distention. Musculoskeletal: Nontender with normal range of motion in all extremities.  Neurologic:  Normal speech and language. No gross focal neurologic deficits  Psychiatric: Mood and affect are normal.  ____________________________________________    EKG  EKG viewed and interpreted by myself shows a normal sinus rhythm at 100 bpm with a  narrow QRS, left axis deviation, largely normal intervals with no concerning ST changes.  ____________________________________________    RADIOLOGY  Chest x-ray negative  ____________________________________________   INITIAL IMPRESSION / ASSESSMENT AND PLAN / ED COURSE  Pertinent labs & imaging results that were available during my care of the patient were reviewed by me and considered in my medical decision making (see chart for details).   Patient presents emergency department for 1 week of diarrhea generalized weakness found to be hypotensive 70s over 30s.  I reviewed the patient's chart on 08/11/2020 she had a C. difficile and GI panel sent that were negative.  Lab work performed today [facility] clinic shows no significant abnormality besides mild renal insufficiency compared to her baseline.  We will IV hydrate with 2 L of fluids, we will treat her diarrhea with loperamide.  We will repeat labs including cardiac enzymes EKG magnesium level.  Given the patient's mild left lower quadrant tenderness palpation 1 week of diarrhea we will obtain CT imaging the abdomen to evaluate for possible colitis.  Given the patient's significant hypotension anticipate likely admission to the hospital once her emergency department work-up is been completed.  Blood pressure improving with fluids.  Patient's Covid test is positive likely explain the patient's diarrhea weakness and hypotension.  We will continue with IV hydration.  We will admit to the hospital service  for further evaluation.  Patient agreeable plan of care.  Kara [name] was evaluated in Emergency Department on 08/14/2020 for the symptoms described in the history of present illness. She was evaluated in the context of the global COVID-19 pandemic, which necessitated consideration that the patient might be at risk for infection with the SARS-CoV-2 virus that causes COVID-19. Institutional protocols and algorithms that pertain to the evaluation of patients at risk for COVID-19 are in a state of rapid change based on information released by regulatory bodies including the CDC and federal and state organizations. These policies and algorithms were followed during the patient's care in the ED.  ____________________________________________   FINAL CLINICAL IMPRESSION(S) / ED DIAGNOSES  Dehydration Renal insufficiency Diarrhea Hypotension COVID-19   Harvest Dark, MD 08/14/20 1549

## 2020-08-15 DIAGNOSIS — G894 Chronic pain syndrome: Secondary | ICD-10-CM

## 2020-08-15 DIAGNOSIS — E1169 Type 2 diabetes mellitus with other specified complication: Secondary | ICD-10-CM

## 2020-08-15 DIAGNOSIS — E669 Obesity, unspecified: Secondary | ICD-10-CM

## 2020-08-15 DIAGNOSIS — U071 COVID-19: Secondary | ICD-10-CM | POA: Diagnosis not present

## 2020-08-15 DIAGNOSIS — A0839 Other viral enteritis: Secondary | ICD-10-CM | POA: Diagnosis not present

## 2020-08-15 LAB — CBC WITH DIFFERENTIAL/PLATELET
Abs Immature Granulocytes: 0.03 10*3/uL (ref 0.00–0.07)
Basophils Absolute: 0 10*3/uL (ref 0.0–0.1)
Basophils Relative: 0 %
Eosinophils Absolute: 0.1 10*3/uL (ref 0.0–0.5)
Eosinophils Relative: 2 %
HCT: 39.4 % (ref 36.0–46.0)
Hemoglobin: 13.2 g/dL (ref 12.0–15.0)
Immature Granulocytes: 1 %
Lymphocytes Relative: 23 %
Lymphs Abs: 1.3 10*3/uL (ref 0.7–4.0)
MCH: 29.5 pg (ref 26.0–34.0)
MCHC: 33.5 g/dL (ref 30.0–36.0)
MCV: 87.9 fL (ref 80.0–100.0)
Monocytes Absolute: 1 10*3/uL (ref 0.1–1.0)
Monocytes Relative: 17 %
Neutro Abs: 3.2 10*3/uL (ref 1.7–7.7)
Neutrophils Relative %: 57 %
Platelets: 209 10*3/uL (ref 150–400)
RBC: 4.48 MIL/uL (ref 3.87–5.11)
RDW: 14.1 % (ref 11.5–15.5)
WBC: 5.7 10*3/uL (ref 4.0–10.5)
nRBC: 0 % (ref 0.0–0.2)

## 2020-08-15 LAB — COMPREHENSIVE METABOLIC PANEL
ALT: 33 U/L (ref 0–44)
AST: 22 U/L (ref 15–41)
Albumin: 3.2 g/dL — ABNORMAL LOW (ref 3.5–5.0)
Alkaline Phosphatase: 41 U/L (ref 38–126)
Anion gap: 5 (ref 5–15)
BUN: 17 mg/dL (ref 8–23)
CO2: 23 mmol/L (ref 22–32)
Calcium: 8.4 mg/dL — ABNORMAL LOW (ref 8.9–10.3)
Chloride: 109 mmol/L (ref 98–111)
Creatinine, Ser: 0.72 mg/dL (ref 0.44–1.00)
GFR, Estimated: >60 mL/min (ref >60)
Glucose, Bld: 119 mg/dL — ABNORMAL HIGH (ref 70–99)
Potassium: 3.8 mmol/L (ref 3.5–5.1)
Sodium: 137 mmol/L (ref 135–145)
Total Bilirubin: 0.3 mg/dL (ref 0.3–1.2)
Total Protein: 6.2 g/dL — ABNORMAL LOW (ref 6.5–8.1)

## 2020-08-15 LAB — C-REACTIVE PROTEIN: CRP: 1.7 mg/dL — ABNORMAL HIGH (ref <1.0)

## 2020-08-15 LAB — GLUCOSE, CAPILLARY
Glucose-Capillary: 114 mg/dL — ABNORMAL HIGH (ref 70–99)
Glucose-Capillary: 136 mg/dL — ABNORMAL HIGH (ref 70–99)

## 2020-08-15 LAB — PROTIME-INR
INR: 1.8 — ABNORMAL HIGH (ref 0.8–1.2)
Prothrombin Time: 20.4 seconds — ABNORMAL HIGH (ref 11.4–15.2)

## 2020-08-15 LAB — D-DIMER, QUANTITATIVE: D-Dimer, Quant: 0.84 ug/mL-FEU — ABNORMAL HIGH (ref 0.00–0.50)

## 2020-08-15 MED ORDER — WARFARIN SODIUM 6 MG PO TABS
6.0000 mg | ORAL_TABLET | Freq: Once | ORAL | Status: DC
  Filled 2020-08-15: qty 1

## 2020-08-15 NOTE — Discharge Summary (Signed)
Physician Discharge Summary  Kara [name] XFG:[id number] DOB: [birth year] DOA: 08/14/2020  PCP: Tracie Harrier, MD  Admit date: 08/14/2020 Discharge date: 08/15/2020  Admitted From: home  Disposition:  Home   Recommendations for Outpatient Follow-up:  1. Follow up with PCP in 1-2 weeks   Home Health: no  Equipment/Devices:   Discharge Condition: stable  CODE STATUS: full  Diet recommendation: Heart Healthy / Carb Modified  Brief/Interim Summary: HPI was taken from Dr. Tobie Poet: Kara [name] is a 70 y.o. female with medical history significant for history of pulmonary embolism, on chronic anticoagulation, hypertension, non-insulin-dependent diabetes mellitus, truncal obesity, metabolic syndrome, history of colostomy with reversal, presents to the emergency department for chief concerns diarrhea.  Ms. Besser states she has been having diarrhea for approximately 7 days.  She states she has been having watery bowel movements about 5-7 times per day during this time frame. There is a possibility of sick contacts as patient attended her son's wedding.    She denies fever, chills, cough, chest pain, shortness of breath, abdominal pain, dysuria, hematuria.  She did have one episode of bright red blood in her stool on 08/13/2020. She denies changes to her diet.    Of note, patient presented for evaluation at [facility] emergency department on 08/11/2020, C. difficile and GI panel was done and found to be negative at the time.  Patient was discharged from the emergency department.  At this time, Covid test was negative as well.  Social history: She lives at home with her spouse.  She denies tobacco, EtOH, recreational drug use.  She is currently semiretired.  Previously and currently she works as a Building control surveyor.  She has 2 patients at this time.  Vaccination: Patient is vaccinated for COVID-19 including booster per patient  Hospital course from Dr. Jimmye Norman 08/15/20: Pt  presented w/ diarrhea likely secondary to [place]. Pt was treated w/ IVFs and remdesivir but pt refused remdesivir stating she did not think she needed it. CXR was neg for pneumonia. Of note, pt was found to have AKI likely secondary to diarrhea. Pt's AKI and pt's diarrhea resolved prior to d/c as well. PT evaluated the pt and no further PT was recommended.   Discharge Diagnoses:  Principal Problem:   Gastroenteritis due to COVID-19 virus Active Problems:   Diabetes mellitus type 2 in obese (HCC)   Nocturnal leg cramps   Benign essential hypertension   History of colostomy reversal   History of pulmonary embolism   Chronic pain syndrome   Diabetic peripheral neuropathy (HCC)   Chronic anticoagulation ([facility])   Uncomplicated opioid dependence (HCC)   Truncal obesity   Metabolic syndrome   Generalized weakness  Gastroenteritis: secondary to COVID-19 infection. Zofran prn.  COVID19 infection: continue on IV remdesivir. CXR neg for pneumonia. Encourage incentive spirometry. Continue on airborne and contact precautions  Hypotension: resolved  AKI: resolved  HTN: will continue to hold home dose of ACE-I secondary normal BP   Chronic pain and neuropathy: continue on home dose of norco, tramadol & gabapentin  DM2: likely poorly controlled. Continue on SSI w/ accuchecks   Hx of PE: continue on warfarin. Will monitor INR daily   Right lower lobe nodularity: as per CT. Will get repeat CT scan in 3-6 months   Obesity: BMI 36.8. Would benefit from weight loss.   Discharge Instructions  Discharge Instructions    Diet - low sodium heart healthy   Complete by: As directed    Diet Carb Modified   Complete  by: As directed    Discharge instructions   Complete by: As directed    Must quarantine for at least 5 days. F/u w/ PCP in 1-2 weeks   Increase activity slowly   Complete by: As directed      Allergies as of 08/15/2020      Reactions   Adhesive [tape] Other (See  Comments)   Pt reports, when removing tape from skin most tapes pull her skin off. Ok to use paper tape   Other Hives   Chlorhexadine wipes/CHG wipes,    Latex Rash   Exam gloves/ does not react around elastic and lips don't swell when blowing balloons   Morphine And Related Nausea And Vomiting      Medication List    TAKE these medications   alendronate 70 MG tablet Commonly known as: FOSAMAX Take 70 mg by mouth once a week. Take with a full glass of water on an empty stomach.   aspirin EC 81 MG tablet Take 81 mg by mouth daily. am   calcium carbonate 600 MG Tabs tablet Commonly known as: Calcium 600 Take 1 tablet (600 mg total) by mouth 2 (two) times daily with a meal.   candesartan 16 MG tablet Commonly known as: ATACAND Take 16 mg by mouth daily. am   cholecalciferol 25 MCG (1000 UNIT) tablet Commonly known as: VITAMIN D Take 1,000 Units by mouth daily.   cyclobenzaprine 10 MG tablet Commonly known as: FLEXERIL Take 10 mg by mouth at bedtime.   Dulaglutide 1.5 MG/0.5ML Sopn Inject 1.5 mg into the skin once a week.   gabapentin 100 MG capsule Commonly known as: NEURONTIN Take 100 mg by mouth 2 (two) times daily.   gabapentin 300 MG capsule Commonly known as: NEURONTIN Take 300 mg by mouth at bedtime.   glipiZIDE 5 MG 24 hr tablet Commonly known as: GLUCOTROL XL Take 15 mg by mouth daily with breakfast.   HYDROcodone-acetaminophen 5-325 MG tablet Commonly known as: NORCO/VICODIN Take 1 tablet by mouth at bedtime. Must last 30 days.   HYDROcodone-acetaminophen 5-325 MG tablet Commonly known as: NORCO/VICODIN Take 1 tablet by mouth at bedtime. Must last 30 days. Start taking on: August 17, 2020   HYDROcodone-acetaminophen 5-325 MG tablet Commonly known as: NORCO/VICODIN Take 1 tablet by mouth at bedtime. Must last 30 days. Start taking on: September 16, 2020   meclizine 25 MG tablet Commonly known as: ANTIVERT Take 25 mg by mouth daily. am    Multi-Vitamins Tabs Take 1 tablet by mouth daily.   nystatin cream Commonly known as: MYCOSTATIN Apply 1 application topically 3 (three) times daily.   ondansetron 4 MG disintegrating tablet Commonly known as: Zofran ODT Take 1 tablet (4 mg total) by mouth every 8 (eight) hours as needed for nausea or vomiting.   rOPINIRole 0.5 MG tablet Commonly known as: REQUIP Take 0.5 mg by mouth at bedtime.   tolterodine 2 MG tablet Commonly known as: DETROL Take 2 mg by mouth 2 (two) times daily.   traMADol 50 MG tablet Commonly known as: ULTRAM Take 1 tablet (50 mg total) by mouth every 8 (eight) hours as needed for severe pain. Must last 30 days   vitamin B-12 500 MCG tablet Commonly known as: CYANOCOBALAMIN Take 500 mcg by mouth daily.   warfarin 6 MG tablet Commonly known as: COUMADIN Take 6 mg by mouth See admin instructions. Take 6 mg Monday- Friday, take 7 mg Saturdays and Sunday       Allergies  Allergen Reactions  . Adhesive [Tape] Other (See Comments)    Pt reports, when removing tape from skin most tapes pull her skin off. Ok to use paper tape  . Other Hives    Chlorhexadine wipes/CHG wipes,   . Latex Rash    Exam gloves/ does not react around elastic and lips don't swell when blowing balloons  . Morphine And Related Nausea And Vomiting    Consultations:     Procedures/Studies: CT ABDOMEN PELVIS W CONTRAST  Result Date: 08/14/2020 CLINICAL DATA:  Abdominal pain and diarrhea EXAM: CT ABDOMEN AND PELVIS WITH CONTRAST TECHNIQUE: Multidetector CT imaging of the abdomen and pelvis was performed using the standard protocol following bolus administration of intravenous contrast. CONTRAST:  9mL OMNIPAQUE IOHEXOL 300 MG/ML  SOLN COMPARISON:  July 03, 2020 CT abdomen and pelvis; chest radiograph August 14, 2020 FINDINGS: Lower chest: On the initial CT slice, there is a nodular opacity in the superior segment right lower lobe measuring 1.3 x 1.1 cm, incompletely  visualized. This opacity is not discernible on portable chest radiograph obtained earlier in the day. Lung bases elsewhere are clear. Hepatobiliary: No focal liver lesions are appreciable. There is cholelithiasis. Gallbladder wall is not appreciably thickened. There is no biliary duct dilatation. Pancreas: There is no pancreatic mass or inflammatory focus. Spleen: No splenic lesions are evident. Adrenals/Urinary Tract: Adrenals bilaterally appear unremarkable. There is no evident renal mass or hydronephrosis on either side. There is no evident renal or ureteral calculus on either side. The urinary bladder is midline with wall thickness within normal limits. Stomach/Bowel: There is no appreciable diverticulitis. There is no appreciable bowel wall or mesenteric thickening. There is a focal midline ventral hernia which contains a loop of jejunum as well as a loop of transverse colon. There is no bowel compromise or bowel obstruction in these areas. No evident bowel obstruction elsewhere. Terminal ileum appears unremarkable. Appendix absent. No periappendiceal region inflammatory change. No evident free air or portal venous air. Vascular/Lymphatic: There is no abdominal aortic aneurysm. There is aortic atherosclerosis. Major venous structures appear patent. There is no evident adenopathy in the abdomen or pelvis. Reproductive: Uterine or adnexal mass lesions are appreciable. Other: No abscess or ascites is evident in the abdomen or pelvis. Musculoskeletal: There is degenerative change in the lower thoracic and lumbar spine regions. Vacuum phenomenon is noted at L4-5 and L5-S1. There is stable 5 mm of anterolisthesis of L4 on L5. There is stable 3 mm of anterolisthesis of L5 on S1. Apparent pars defects at L5 bilaterally and at L4 on the left. Evidence of previous sacroplasty procedure. No blastic or lytic bone lesions. No intramuscular lesions are evident. IMPRESSION: 1. Incomplete visualization of a nodular opacity in  the superior segment right lower lobe measuring 1.3 x 1.1 cm. This lesion warrants further assessment with complete chest CT to completely assess this lesion as well as to assess for other potential lesions of this nature. 2.  Cholelithiasis.  No gallbladder wall thickening. 3. There is a midline ventral hernia located approximately 9 cm superior to the umbilicus. This ventral hernia contains a loop of transverse colon as well as a loop of jejunum. No bowel compromise in areas of protrusion into the hernia. This hernia at its neck measures 6.1 cm from right to left dimension and 6.8 cm from superior to inferior dimension. 4. No evident bowel obstruction or bowel wall thickening. No diverticular disease. No abscess in the abdomen or pelvis. Appendix absent. 5. No renal or ureteral  calculus. No hydronephrosis. Urinary bladder wall thickness normal. 6.  Aortic Atherosclerosis (ICD10-I70.0). 7. Extensive arthropathy in the thoracic and lumbar spine with vacuum phenomenon at L4-5 and L5-S1. Spondylolisthesis at L4-5 and L5-S1 is stable. Status post sacroplasty. Electronically Signed   By: Lowella Grip III M.D.   On: 08/14/2020 16:04   DG Chest Portable 1 View  Result Date: 08/14/2020 CLINICAL DATA:  Diarrhea and weakness EXAM: PORTABLE CHEST 1 VIEW COMPARISON:  July 03, 2020 FINDINGS: Lungs are clear. Heart is upper normal in size with pulmonary vascularity normal. No adenopathy. There is aortic atherosclerosis. There is degenerative change in the left acromioclavicular joint, stable. IMPRESSION: Lungs clear. Heart upper normal in size. Aortic Atherosclerosis (ICD10-I70.0). Electronically Signed   By: Lowella Grip III M.D.   On: 08/14/2020 15:24   DG PAIN CLINIC C-ARM 1-60 MIN NO REPORT  Result Date: 08/05/2020 Fluoro was used, but no Radiologist interpretation will be provided. Please refer to "NOTES" tab for provider progress note.    Subjective: Pt denies any complaints    Discharge  Exam: Vitals:   08/15/20 0457 08/15/20 0834  BP: 127/61 135/70  Pulse: 95 89  Resp: 18 18  Temp: 98.2 F (36.8 C) 98.5 F (36.9 C)  SpO2: 93% 98%   Vitals:   08/14/20 1949 08/14/20 2338 08/15/20 0457 08/15/20 0834  BP: (!) 121/50 (!) 117/43 127/61 135/70  Pulse: 91 93 95 89  Resp: 16 16 18 18   Temp: 98.3 F (36.8 C) 98.5 F (36.9 C) 98.2 F (36.8 C) 98.5 F (36.9 C)  TempSrc: Oral Oral Oral Oral  SpO2: 96% 100% 93% 98%  Weight:      Height:        General: Pt is alert, awake, not in acute distress Cardiovascular: S1/S2 +, no rubs, no gallops Respiratory: CTA bilaterally, no wheezing, no rhonchi Abdominal: Soft, NT, obese, bowel sounds + Extremities:  no cyanosis    The results of significant diagnostics from this hospitalization (including imaging, microbiology, ancillary and laboratory) are listed below for reference.     Microbiology: Recent Results (from the past 240 hour(s))  C Difficile Quick Screen w PCR reflex     Status: None   Collection Time: 08/11/20  5:50 AM   Specimen: STOOL  Result Value Ref Range Status   C Diff antigen NEGATIVE NEGATIVE Final   C Diff toxin NEGATIVE NEGATIVE Final   C Diff interpretation No C. difficile detected.  Final    Comment: Performed at [facility], [address] [place], [place] [address]  Gastrointestinal Panel by PCR , Stool     Status: None   Collection Time: 08/11/20  5:50 AM   Specimen: STOOL  Result Value Ref Range Status   Campylobacter species NOT DETECTED NOT DETECTED Final   Plesimonas shigelloides NOT DETECTED NOT DETECTED Final   Salmonella species NOT DETECTED NOT DETECTED Final   Yersinia enterocolitica NOT DETECTED NOT DETECTED Final   Vibrio species NOT DETECTED NOT DETECTED Final   Vibrio cholerae NOT DETECTED NOT DETECTED Final   Enteroaggregative E coli (EAEC) NOT DETECTED NOT DETECTED Final   Enteropathogenic E coli (EPEC) NOT DETECTED NOT DETECTED Final   Enterotoxigenic E coli  (ETEC) NOT DETECTED NOT DETECTED Final   Shiga like toxin producing E coli (STEC) NOT DETECTED NOT DETECTED Final   Shigella/Enteroinvasive E coli (EIEC) NOT DETECTED NOT DETECTED Final   Cryptosporidium NOT DETECTED NOT DETECTED Final   Cyclospora cayetanensis NOT DETECTED NOT DETECTED Final  Entamoeba histolytica NOT DETECTED NOT DETECTED Final   Giardia lamblia NOT DETECTED NOT DETECTED Final   Adenovirus F40/41 NOT DETECTED NOT DETECTED Final   Astrovirus NOT DETECTED NOT DETECTED Final   Norovirus GI/GII NOT DETECTED NOT DETECTED Final   Rotavirus A NOT DETECTED NOT DETECTED Final   Sapovirus (I, II, IV, and V) NOT DETECTED NOT DETECTED Final    Comment: Performed at [facility], [address] [place], [place] [address]  Resp Panel by RT-PCR (Flu A&B, Covid) Nasopharyngeal Swab     Status: Abnormal   Collection Time: 08/14/20 12:57 PM   Specimen: Nasopharyngeal Swab; Nasopharyngeal(NP) swabs in vial transport medium  Result Value Ref Range Status   SARS Coronavirus 2 by RT PCR POSITIVE (A) NEGATIVE Final    Comment: RESULT CALLED TO, READ BACK BY AND VERIFIED WITH: JASHIRA CARRION 08/14/20 1407 KLW (NOTE) SARS-CoV-2 target nucleic acids are DETECTED.  The SARS-CoV-2 RNA is generally detectable in upper respiratory specimens during the acute phase of infection. Positive results are indicative of the presence of the identified virus, but do not rule out bacterial infection or co-infection with other pathogens not detected by the test. Clinical correlation with patient history and other diagnostic information is necessary to determine patient infection status. The expected result is Negative.  Fact Sheet for Patients: EntrepreneurPulse.com.au  Fact Sheet for Healthcare Providers: IncredibleEmployment.be  This test is not yet approved or cleared by the [place] FDA and  has been authorized for detection and/or diagnosis of  SARS-CoV-2 by FDA under an Emergency Use Authorization (EUA).  This EUA will remain in effect (meaning this test can be  used) for the duration of  the COVID-19 declaration under Section 564(b)(1) of the Act, 21 U.S.C. section 360bbb-3(b)(1), unless the authorization is terminated or revoked sooner.     Influenza A by PCR NEGATIVE NEGATIVE Final   Influenza B by PCR NEGATIVE NEGATIVE Final    Comment: (NOTE) The Xpert Xpress SARS-CoV-2/FLU/RSV plus assay is intended as an aid in the diagnosis of influenza from Nasopharyngeal swab specimens and should not be used as a sole basis for treatment. Nasal washings and aspirates are unacceptable for Xpert Xpress SARS-CoV-2/FLU/RSV testing.  Fact Sheet for Patients: EntrepreneurPulse.com.au  Fact Sheet for Healthcare Providers: IncredibleEmployment.be  This test is not yet approved or cleared by the [place] FDA and has been authorized for detection and/or diagnosis of SARS-CoV-2 by FDA under an Emergency Use Authorization (EUA). This EUA will remain in effect (meaning this test can be used) for the duration of the COVID-19 declaration under Section 564(b)(1) of the Act, 21 U.S.C. section 360bbb-3(b)(1), unless the authorization is terminated or revoked.  Performed at [facility], [address] [place], [place] [address]      Labs: BNP (last 3 results) No results for input(s): BNP in the last 8760 hours. Basic Metabolic Panel: Recent Labs  Lab 08/11/20 0140 08/14/20 1257 08/15/20 0543  NA 140 134* 137  K 3.5 3.8 3.8  CL 109 107 109  CO2 20* 19* 23  GLUCOSE 230* 211* 119*  BUN 18 19 17   CREATININE 0.80 1.38* 0.72  CALCIUM 9.3 8.3* 8.4*  MG  --  1.7  --    Liver Function Tests: Recent Labs  Lab 08/11/20 0140 08/14/20 1257 08/15/20 0543  AST 32 34 22  ALT 48* 39 33  ALKPHOS 47 40 41  BILITOT 0.7 0.9 0.3  PROT 7.5 6.9 6.2*  ALBUMIN 4.1 3.5 3.2*  Recent Labs   Lab 08/11/20 0140  LIPASE 38   No results for input(s): AMMONIA in the last 168 hours. CBC: Recent Labs  Lab 08/11/20 0140 08/14/20 1257 08/15/20 0543  WBC 10.3 6.9 5.7  NEUTROABS  --   --  3.2  HGB 14.3 14.3 13.2  HCT 45.4 43.2 39.4  MCV 91.7 87.3 87.9  PLT 280 248 209   Cardiac Enzymes: No results for input(s): CKTOTAL, CKMB, CKMBINDEX, TROPONINI in the last 168 hours. BNP: Invalid input(s): POCBNP CBG: Recent Labs  Lab 08/14/20 2053 08/15/20 0834  GLUCAP 165* 114*   D-Dimer Recent Labs    08/15/20 0543  DDIMER 0.84*   Hgb A1c No results for input(s): HGBA1C in the last 72 hours. Lipid Profile No results for input(s): CHOL, HDL, LDLCALC, TRIG, CHOLHDL, LDLDIRECT in the last 72 hours. Thyroid function studies No results for input(s): TSH, T4TOTAL, T3FREE, THYROIDAB in the last 72 hours.  Invalid input(s): FREET3 Anemia work up No results for input(s): VITAMINB12, FOLATE, FERRITIN, TIBC, IRON, RETICCTPCT in the last 72 hours. Urinalysis    Component Value Date/Time   COLORURINE AMBER (A) 08/14/2020 1503   APPEARANCEUR CLOUDY (A) 08/14/2020 1503   APPEARANCEUR Clear 05/29/2013 0631   LABSPEC 1.013 08/14/2020 1503   LABSPEC 1.015 05/29/2013 0631   PHURINE 5.0 08/14/2020 1503   GLUCOSEU NEGATIVE 08/14/2020 1503   GLUCOSEU Negative 05/29/2013 0631   HGBUR NEGATIVE 08/14/2020 1503   BILIRUBINUR NEGATIVE 08/14/2020 1503   BILIRUBINUR Negative 05/29/2013 0631   KETONESUR 5 (A) 08/14/2020 1503   PROTEINUR 30 (A) 08/14/2020 1503   NITRITE POSITIVE (A) 08/14/2020 1503   LEUKOCYTESUR LARGE (A) 08/14/2020 1503   LEUKOCYTESUR Negative 05/29/2013 0631   Sepsis Labs Invalid input(s): PROCALCITONIN,  WBC,  LACTICIDVEN Microbiology Recent Results (from the past 240 hour(s))  C Difficile Quick Screen w PCR reflex     Status: None   Collection Time: 08/11/20  5:50 AM   Specimen: STOOL  Result Value Ref Range Status   C Diff antigen NEGATIVE NEGATIVE Final   C  Diff toxin NEGATIVE NEGATIVE Final   C Diff interpretation No C. difficile detected.  Final    Comment: Performed at [facility], [address] [place], [place] [address]  Gastrointestinal Panel by PCR , Stool     Status: None   Collection Time: 08/11/20  5:50 AM   Specimen: STOOL  Result Value Ref Range Status   Campylobacter species NOT DETECTED NOT DETECTED Final   Plesimonas shigelloides NOT DETECTED NOT DETECTED Final   Salmonella species NOT DETECTED NOT DETECTED Final   Yersinia enterocolitica NOT DETECTED NOT DETECTED Final   Vibrio species NOT DETECTED NOT DETECTED Final   Vibrio cholerae NOT DETECTED NOT DETECTED Final   Enteroaggregative E coli (EAEC) NOT DETECTED NOT DETECTED Final   Enteropathogenic E coli (EPEC) NOT DETECTED NOT DETECTED Final   Enterotoxigenic E coli (ETEC) NOT DETECTED NOT DETECTED Final   Shiga like toxin producing E coli (STEC) NOT DETECTED NOT DETECTED Final   Shigella/Enteroinvasive E coli (EIEC) NOT DETECTED NOT DETECTED Final   Cryptosporidium NOT DETECTED NOT DETECTED Final   Cyclospora cayetanensis NOT DETECTED NOT DETECTED Final   Entamoeba histolytica NOT DETECTED NOT DETECTED Final   Giardia lamblia NOT DETECTED NOT DETECTED Final   Adenovirus F40/41 NOT DETECTED NOT DETECTED Final   Astrovirus NOT DETECTED NOT DETECTED Final   Norovirus GI/GII NOT DETECTED NOT DETECTED Final   Rotavirus A NOT DETECTED NOT DETECTED Final  Sapovirus (I, II, IV, and V) NOT DETECTED NOT DETECTED Final    Comment: Performed at [facility], [address] [place], [place] [address]  Resp Panel by RT-PCR (Flu A&B, Covid) Nasopharyngeal Swab     Status: Abnormal   Collection Time: 08/14/20 12:57 PM   Specimen: Nasopharyngeal Swab; Nasopharyngeal(NP) swabs in vial transport medium  Result Value Ref Range Status   SARS Coronavirus 2 by RT PCR POSITIVE (A) NEGATIVE Final    Comment: RESULT CALLED TO, READ BACK BY AND VERIFIED WITH: JASHIRA  CARRION 08/14/20 1407 KLW (NOTE) SARS-CoV-2 target nucleic acids are DETECTED.  The SARS-CoV-2 RNA is generally detectable in upper respiratory specimens during the acute phase of infection. Positive results are indicative of the presence of the identified virus, but do not rule out bacterial infection or co-infection with other pathogens not detected by the test. Clinical correlation with patient history and other diagnostic information is necessary to determine patient infection status. The expected result is Negative.  Fact Sheet for Patients: EntrepreneurPulse.com.au  Fact Sheet for Healthcare Providers: IncredibleEmployment.be  This test is not yet approved or cleared by the [place] FDA and  has been authorized for detection and/or diagnosis of SARS-CoV-2 by FDA under an Emergency Use Authorization (EUA).  This EUA will remain in effect (meaning this test can be  used) for the duration of  the COVID-19 declaration under Section 564(b)(1) of the Act, 21 U.S.C. section 360bbb-3(b)(1), unless the authorization is terminated or revoked sooner.     Influenza A by PCR NEGATIVE NEGATIVE Final   Influenza B by PCR NEGATIVE NEGATIVE Final    Comment: (NOTE) The Xpert Xpress SARS-CoV-2/FLU/RSV plus assay is intended as an aid in the diagnosis of influenza from Nasopharyngeal swab specimens and should not be used as a sole basis for treatment. Nasal washings and aspirates are unacceptable for Xpert Xpress SARS-CoV-2/FLU/RSV testing.  Fact Sheet for Patients: EntrepreneurPulse.com.au  Fact Sheet for Healthcare Providers: IncredibleEmployment.be  This test is not yet approved or cleared by the [place] FDA and has been authorized for detection and/or diagnosis of SARS-CoV-2 by FDA under an Emergency Use Authorization (EUA). This EUA will remain in effect (meaning this test can be used) for the  duration of the COVID-19 declaration under Section 564(b)(1) of the Act, 21 U.S.C. section 360bbb-3(b)(1), unless the authorization is terminated or revoked.  Performed at [facility], [address] [place],  [address]      Time coordinating discharge: Over 30 minutes  SIGNED:   Wyvonnia Dusky, MD  [facility] 08/15/2020, 10:16 AM Pager   If 7PM-7AM, please contact night-coverage [email or web address]

## 2020-08-15 NOTE — Plan of Care (Signed)
Shift Summary: No acute events overnight. Remains on RA, orientedx4, NSR/ST on telemetry. Independent to BSC, adequate UO, x1 BM this shift but unable to collect stool sample. Turns self in bed. Rounding performed, universal safety precautions in place, continuing with plan of care.   Problem: Education: Goal: Knowledge of General Education information will improve Description: Including pain rating scale, medication(s)/side effects and non-pharmacologic comfort measures Outcome: Progressing   Problem: Education: Goal: Knowledge of risk factors and measures for prevention of condition will improve Outcome: Progressing   Problem: Coping: Goal: Psychosocial and spiritual needs will be supported Outcome: Progressing   Problem: Respiratory: Goal: Will maintain a patent airway Outcome: Progressing Goal: Complications related to the disease process, condition or treatment will be avoided or minimized Outcome: Progressing

## 2020-08-15 NOTE — Progress Notes (Signed)
OT Cancellation Note  Patient Details Name: Kara [name] MRN: [id number] DOB: 1951-03-12   Cancelled Treatment:    Reason Eval/Treat Not Completed: Per PT, pt screened, doing well, no needs identified, pt declines OT services. Will sign off.   Josiah Lobo, PhD, MS, OTR/L 08/15/20, 11:03 AM

## 2020-08-15 NOTE — Evaluation (Signed)
Physical Therapy Evaluation Patient Details Name: AMILEY SHISHIDO MRN: [id number] DOB: Jan 08, 1951 Today's Date: 08/15/2020   History of Present Illness  Pt is a 70 y.o. female with medical history significant for history of pulmonary embolism, on chronic anticoagulation, hypertension, non-insulin-dependent diabetes mellitus, truncal obesity, metabolic syndrome, history of colostomy with reversal, presents to the emergency department for chief concerns diarrhea.  MD assessment includes: Gastroenteritis secondary to COVID-19 infection, Covid 19, hypotension resolved, AKI resolved, chronic pain and neuropathy, and RLL nodularity.    Clinical Impression  Pt was pleasant and motivated to participate during the session.  Pt was Ind with all functional tasks and demonstrated very good functional strength and stability throughout the session.  No adverse symptoms noted during the session with pt stating that she feels like she is currently at her functional baseline with no PT needs.  Will complete PT orders at this time but will reassess pt pending a change in status upon receipt of new PT orders.      Follow Up Recommendations No PT follow up    Equipment Recommendations  None recommended by PT    Recommendations for Other Services       Precautions / Restrictions Precautions Precautions: None Restrictions Weight Bearing Restrictions: No      Mobility  Bed Mobility Overal bed mobility: Independent                  Transfers Overall transfer level: Independent Equipment used: None             General transfer comment: Good eccentric and concentric control and stability  Ambulation/Gait Ambulation/Gait assistance: Independent Gait Distance (Feet): 40 Feet Assistive device: IV Pole;None Gait Pattern/deviations: Step-through pattern;Decreased step length - right;Decreased step length - left Gait velocity: decreased   General Gait Details: Pt steady with amb  with the IV pole and without AD; amb distance limited secondary to pt isolated to room; able to make sharp 180 deg turns and navigate tight spaces with no instability noted  Stairs Stairs:  (deferred secondary to covid isolation but no concerns with pt's ability to navigate stairs with rails as per home setup based on pt's demonstrated functional strength and stability)          Wheelchair Mobility    Modified Rankin (Stroke Patients Only)       Balance Overall balance assessment: No apparent balance deficits (not formally assessed)                                           Pertinent Vitals/Pain Pain Assessment: No/denies pain    Home Living Family/patient expects to be discharged to:: Private residence Living Arrangements: Spouse/significant other Available Help at Discharge: Family Type of Home: House Home Access: Stairs to enter Entrance Stairs-Rails: Right;Left;Can reach both Entrance Stairs-Number of Steps: 4 Home Layout: One level Home Equipment: Environmental consultant - 2 wheels;Cane - single point;Grab bars - toilet;Walker - 4 wheels;Shower seat      Prior Function Level of Independence: Independent         Comments: Pt Ind with amb without an AD with occasional SPC use PRN, no fall history, 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     Hand Dominance        Extremity/Trunk Assessment   Upper Extremity Assessment Upper Extremity Assessment: Overall WFL for tasks assessed    Lower Extremity Assessment  Lower Extremity Assessment: Overall WFL for tasks assessed       Communication   Communication: No difficulties  Cognition Arousal/Alertness: Awake/alert Behavior During Therapy: WFL for tasks assessed/performed Overall Cognitive Status: Within Functional Limits for tasks assessed                                        General Comments      Exercises     Assessment/Plan    PT Assessment Patent does not need any  further PT services  PT Problem List         PT Treatment Interventions      PT Goals (Current goals can be found in the Care Plan section)  Acute Rehab PT Goals Patient Stated Goal: To return home PT Goal Formulation: All assessment and education complete, DC therapy    Frequency     Barriers to discharge        Co-evaluation               AM-PAC PT "6 Clicks" Mobility  Outcome Measure Help needed turning from your back to your side while in a flat bed without using bedrails?: None Help needed moving from lying on your back to sitting on the side of a flat bed without using bedrails?: None Help needed moving to and from a bed to a chair (including a wheelchair)?: None Help needed standing up from a chair using your arms (e.g., wheelchair or bedside chair)?: None Help needed to walk in hospital room?: None Help needed climbing 3-5 steps with a railing? : None 6 Click Score: 24    End of Session   Activity Tolerance: Patient tolerated treatment well Patient left: in bed;with call bell/phone within reach Nurse Communication: Mobility status PT Visit Diagnosis: Difficulty in walking, not elsewhere classified (R26.2)    Time: 6606-0045 PT Time Calculation (min) (ACUTE ONLY): 37 min   Charges:   PT Evaluation $PT Eval Low Complexity: 1 Low          D. Scott Emmerich Cryer PT, DPT 08/15/20, 11:53 AM

## 2020-08-15 NOTE — Progress Notes (Signed)
[facility] for Warfarin Indication: history of PE  Allergies  Allergen Reactions  . Adhesive [Tape] Other (See Comments)    Pt reports, when removing tape from skin most tapes pull her skin off. Ok to use paper tape  . Other Hives    Chlorhexadine wipes/CHG wipes,   . Latex Rash    Exam gloves/ does not react around elastic and lips don't swell when blowing balloons  . Morphine And Related Nausea And Vomiting    Patient Measurements: Height: 5\' 3"  (160 cm) Weight: 94.3 kg (208 lb) IBW/kg (Calculated) : 52.4 Heparin Dosing Weight:    Vital Signs: Temp: 98.5 F (36.9 C) (03/25 0834) Temp Source: Oral (03/25 0834) BP: 135/70 (03/25 0834) Pulse Rate: 89 (03/25 0834)  Labs: Recent Labs    08/14/20 1257 08/14/20 1503 08/14/20 1704 08/15/20 0543  HGB 14.3  --   --  13.2  HCT 43.2  --   --  39.4  PLT 248  --   --  209  LABPROT  --   --  18.9* 20.4*  INR  --   --  1.6* 1.8*  CREATININE 1.38*  --   --  0.72  TROPONINIHS 4 3  --   --     Estimated Creatinine Clearance: 72.5 mL/min (by C-G formula based on SCr of 0.72 mg/dL).  Assessment: Patient is a 70yo female admitted for Covid. Patient was taking Warfarin prior to admission for history of PE. Reviewed outpatient records and it looks like the most recent refill sent to pharmacy was for Warfarin 1mg  daily. INR on admission is subtherapeutic at 1.6.  Date INR Warfarin Dose  3/24 1.6 2.5mg   3/25 1.8 6mg      Update: 1mg  tabs were to add to 6mg  daily dose on Sat/Sun.  Med rec confirmed dosing is 6mg  daily with 7 mg on Sat/Sun.    Goal of Therapy:  INR 2-3 Monitor platelets by anticoagulation protocol: Yes   Plan:  Will order Warfarin 6mg  times one dose. Daily INR/CBC.  Lu Duffel, PharmD, BCPS Clinical Pharmacist 08/15/2020 8:51 AM

## 2020-08-18 ENCOUNTER — Telehealth: Payer: Self-pay

## 2020-08-18 DIAGNOSIS — Z20822 Contact with and (suspected) exposure to covid-19: Secondary | ICD-10-CM | POA: Diagnosis not present

## 2020-08-18 DIAGNOSIS — Z03818 Encounter for observation for suspected exposure to other biological agents ruled out: Secondary | ICD-10-CM | POA: Diagnosis not present

## 2020-08-18 NOTE — Telephone Encounter (Signed)
LM for patient to call office to go over pre virtual appointment questions.  

## 2020-08-19 ENCOUNTER — Ambulatory Visit: Payer: PPO | Attending: Pain Medicine | Admitting: Pain Medicine

## 2020-08-19 ENCOUNTER — Other Ambulatory Visit: Payer: Self-pay

## 2020-08-19 DIAGNOSIS — M545 Low back pain, unspecified: Secondary | ICD-10-CM

## 2020-08-19 DIAGNOSIS — M4807 Spinal stenosis, lumbosacral region: Secondary | ICD-10-CM | POA: Diagnosis not present

## 2020-08-19 DIAGNOSIS — M4310 Spondylolisthesis, site unspecified: Secondary | ICD-10-CM

## 2020-08-19 DIAGNOSIS — M79605 Pain in left leg: Secondary | ICD-10-CM | POA: Diagnosis not present

## 2020-08-19 DIAGNOSIS — G894 Chronic pain syndrome: Secondary | ICD-10-CM

## 2020-08-19 DIAGNOSIS — M47816 Spondylosis without myelopathy or radiculopathy, lumbar region: Secondary | ICD-10-CM

## 2020-08-19 DIAGNOSIS — M79604 Pain in right leg: Secondary | ICD-10-CM | POA: Diagnosis not present

## 2020-08-19 DIAGNOSIS — G8929 Other chronic pain: Secondary | ICD-10-CM

## 2020-08-19 DIAGNOSIS — M4306 Spondylolysis, lumbar region: Secondary | ICD-10-CM | POA: Diagnosis not present

## 2020-08-19 DIAGNOSIS — M5137 Other intervertebral disc degeneration, lumbosacral region: Secondary | ICD-10-CM

## 2020-08-19 DIAGNOSIS — M51379 Other intervertebral disc degeneration, lumbosacral region without mention of lumbar back pain or lower extremity pain: Secondary | ICD-10-CM

## 2020-09-02 ENCOUNTER — Telehealth: Payer: Self-pay

## 2020-09-02 NOTE — Telephone Encounter (Signed)
She wants to have a procedure. Pain in back and hips going down legs. Please order what you want her to have.

## 2020-09-08 NOTE — Telephone Encounter (Signed)
Patient wants to come for procedure next week? Says we have to ask Dr. Dossie Arbour first. I explain he is out of office until Monday

## 2020-09-15 ENCOUNTER — Ambulatory Visit
Admission: RE | Admit: 2020-09-15 | Discharge: 2020-09-15 | Disposition: A | Discharge status: Home / Self Care | Payer: PPO | Attending: Internal Medicine | Admitting: Internal Medicine

## 2020-09-15 ENCOUNTER — Telehealth: Payer: Self-pay | Admitting: Pain Medicine

## 2020-09-15 ENCOUNTER — Telehealth: Payer: Self-pay

## 2020-09-15 NOTE — Telephone Encounter (Signed)
Patient is wanting to come in for facet block tomorrow. I dont see any orders. Please ask Dr. Dossie Arbour to place order and I will get her scheduled

## 2020-09-15 NOTE — Telephone Encounter (Signed)
Spoke with Dr Dossie Arbour about getting a procedure for tomorrow and he said no that they needed to be spaced out about 2-3 months

## 2020-09-16 DIAGNOSIS — M25511 Pain in right shoulder: Secondary | ICD-10-CM | POA: Diagnosis not present

## 2020-09-16 DIAGNOSIS — M79601 Pain in right arm: Secondary | ICD-10-CM | POA: Diagnosis not present

## 2020-09-16 DIAGNOSIS — M19011 Primary osteoarthritis, right shoulder: Secondary | ICD-10-CM | POA: Diagnosis not present

## 2020-09-16 DIAGNOSIS — R918 Other nonspecific abnormal finding of lung field: Secondary | ICD-10-CM | POA: Diagnosis not present

## 2020-09-17 NOTE — Telephone Encounter (Signed)
Ok patient is scheduled for LFacets on May 10. She would like to have both sides done that day if possible. Please let her know if she can get both sides.

## 2020-09-18 ENCOUNTER — Ambulatory Visit: Payer: PPO | Admitting: Pain Medicine

## 2020-09-22 DIAGNOSIS — J984 Other disorders of lung: Secondary | ICD-10-CM | POA: Diagnosis not present

## 2020-09-22 DIAGNOSIS — Z8709 Personal history of other diseases of the respiratory system: Secondary | ICD-10-CM | POA: Diagnosis not present

## 2020-09-22 DIAGNOSIS — I700 Atherosclerosis of aorta: Secondary | ICD-10-CM | POA: Diagnosis not present

## 2020-09-22 DIAGNOSIS — R918 Other nonspecific abnormal finding of lung field: Secondary | ICD-10-CM | POA: Diagnosis not present

## 2020-09-22 DIAGNOSIS — I517 Cardiomegaly: Secondary | ICD-10-CM | POA: Diagnosis not present

## 2020-09-24 ENCOUNTER — Other Ambulatory Visit (HOSPITAL_COMMUNITY): Payer: Self-pay | Admitting: Internal Medicine

## 2020-09-24 ENCOUNTER — Other Ambulatory Visit: Payer: Self-pay | Admitting: Internal Medicine

## 2020-09-24 DIAGNOSIS — J984 Other disorders of lung: Secondary | ICD-10-CM

## 2020-09-30 ENCOUNTER — Ambulatory Visit (HOSPITAL_BASED_OUTPATIENT_CLINIC_OR_DEPARTMENT_OTHER): Payer: PPO | Admitting: Pain Medicine

## 2020-09-30 ENCOUNTER — Other Ambulatory Visit: Payer: Self-pay

## 2020-09-30 ENCOUNTER — Encounter: Payer: Self-pay | Admitting: Pain Medicine

## 2020-09-30 ENCOUNTER — Ambulatory Visit
Admission: RE | Admit: 2020-09-30 | Discharge: 2020-09-30 | Disposition: A | Discharge status: Home / Self Care | Payer: PPO | Source: Ambulatory Visit | Attending: Pain Medicine | Admitting: Pain Medicine

## 2020-09-30 VITALS — BP 127/69 | HR 87 | Temp 97.0°F | Resp 16 | Ht 63.0 in | Wt 206.0 lb

## 2020-09-30 DIAGNOSIS — M4310 Spondylolisthesis, site unspecified: Secondary | ICD-10-CM | POA: Diagnosis not present

## 2020-09-30 DIAGNOSIS — M5137 Other intervertebral disc degeneration, lumbosacral region: Secondary | ICD-10-CM | POA: Insufficient documentation

## 2020-09-30 DIAGNOSIS — M47816 Spondylosis without myelopathy or radiculopathy, lumbar region: Secondary | ICD-10-CM | POA: Diagnosis not present

## 2020-09-30 DIAGNOSIS — Z7901 Long term (current) use of anticoagulants: Secondary | ICD-10-CM | POA: Insufficient documentation

## 2020-09-30 DIAGNOSIS — M4306 Spondylolysis, lumbar region: Secondary | ICD-10-CM | POA: Diagnosis not present

## 2020-09-30 DIAGNOSIS — M545 Low back pain, unspecified: Secondary | ICD-10-CM | POA: Insufficient documentation

## 2020-09-30 DIAGNOSIS — G8929 Other chronic pain: Secondary | ICD-10-CM | POA: Insufficient documentation

## 2020-09-30 DIAGNOSIS — M47817 Spondylosis without myelopathy or radiculopathy, lumbosacral region: Secondary | ICD-10-CM | POA: Diagnosis not present

## 2020-09-30 DIAGNOSIS — M51379 Other intervertebral disc degeneration, lumbosacral region without mention of lumbar back pain or lower extremity pain: Secondary | ICD-10-CM

## 2020-09-30 MED ORDER — LACTATED RINGERS IV SOLN
1000.0000 mL | Freq: Once | INTRAVENOUS | Status: AC
  Administered 2020-09-30: 1000 mL via INTRAVENOUS

## 2020-09-30 MED ORDER — LIDOCAINE HCL 2 % IJ SOLN
20.0000 mL | Freq: Once | INTRAMUSCULAR | Status: AC
Start: 2020-09-30 — End: 2020-09-30
  Administered 2020-09-30: 400 mg

## 2020-09-30 MED ORDER — KETOROLAC TROMETHAMINE 60 MG/2ML IM SOLN
60.0000 mg | Freq: Once | INTRAMUSCULAR | Status: AC
  Administered 2020-09-30: 60 mg via INTRAMUSCULAR

## 2020-09-30 MED ORDER — ORPHENADRINE CITRATE 30 MG/ML IJ SOLN
INTRAMUSCULAR | Status: AC
  Filled 2020-09-30: qty 2

## 2020-09-30 MED ORDER — ORPHENADRINE CITRATE 30 MG/ML IJ SOLN
60.0000 mg | Freq: Once | INTRAMUSCULAR | Status: AC
Start: 2020-09-30 — End: 2020-09-30
  Administered 2020-09-30: 60 mg via INTRAMUSCULAR

## 2020-09-30 MED ORDER — FENTANYL CITRATE (PF) 100 MCG/2ML IJ SOLN
INTRAMUSCULAR | Status: AC
  Filled 2020-09-30: qty 2

## 2020-09-30 MED ORDER — TRIAMCINOLONE ACETONIDE 40 MG/ML IJ SUSP
40.0000 mg | Freq: Once | INTRAMUSCULAR | Status: AC
Start: 2020-09-30 — End: 2020-09-30
  Administered 2020-09-30: 40 mg

## 2020-09-30 MED ORDER — FENTANYL CITRATE (PF) 100 MCG/2ML IJ SOLN
25.0000 ug | INTRAMUSCULAR | Status: DC | PRN
Start: 2020-09-30 — End: 2020-09-30
  Administered 2020-09-30: 50 ug via INTRAVENOUS

## 2020-09-30 MED ORDER — ROPIVACAINE HCL 2 MG/ML IJ SOLN
INTRAMUSCULAR | Status: AC
  Filled 2020-09-30: qty 10

## 2020-09-30 MED ORDER — MIDAZOLAM HCL 5 MG/5ML IJ SOLN
1.0000 mg | INTRAMUSCULAR | Status: DC | PRN
  Administered 2020-09-30: 1 mg via INTRAVENOUS

## 2020-09-30 MED ORDER — TRIAMCINOLONE ACETONIDE 40 MG/ML IJ SUSP
INTRAMUSCULAR | Status: AC
  Filled 2020-09-30: qty 1

## 2020-09-30 MED ORDER — LIDOCAINE HCL 2 % IJ SOLN
INTRAMUSCULAR | Status: AC
  Filled 2020-09-30: qty 20

## 2020-09-30 MED ORDER — MIDAZOLAM HCL 5 MG/5ML IJ SOLN
INTRAMUSCULAR | Status: AC
  Filled 2020-09-30: qty 5

## 2020-09-30 MED ORDER — ROPIVACAINE HCL 2 MG/ML IJ SOLN
9.0000 mL | Freq: Once | INTRAMUSCULAR | Status: AC
Start: 2020-09-30 — End: 2020-09-30
  Administered 2020-09-30: 9 mL via PERINEURAL

## 2020-09-30 MED ORDER — KETOROLAC TROMETHAMINE 60 MG/2ML IM SOLN
INTRAMUSCULAR | Status: AC
  Filled 2020-09-30: qty 2

## 2020-09-30 NOTE — Progress Notes (Signed)
PROVIDER NOTE: Information contained herein reflects review and annotations entered in association with encounter. Interpretation of such information and data should be left to medically-trained personnel. Information provided to patient can be located elsewhere in the medical record under "Patient Instructions". Document created using STT-dictation technology, any transcriptional errors that may result from process are unintentional.    Patient: Kara [name]  Service Category: Procedure  Provider: Gaspar Cola, MD  DOB: [birth year]  DOS: 09/30/2020  Location: [facility]  MRN: [id number]  Setting: Ambulatory - outpatient  Referring Provider: Tracie Harrier, MD  Type: Established Patient  Specialty: Interventional Pain Management  PCP: Tracie Harrier, MD   Primary Reason for Visit: Interventional Pain Management Treatment. CC: Back Pain (Low bilateral, right is worse)  Procedure:          Anesthesia, Analgesia, Anxiolysis:  Type: Lumbar Facet, Medial Branch Block(s)          Primary Purpose: Palliative Region: Posterolateral Lumbosacral Spine Level: L2, L3, L4, L5, & S1 Medial Branch Level(s). Injecting these levels blocks the L3-4, L4-5, and L5-S1 lumbar facet joints. Laterality: Right  Type: Moderate (Conscious) Sedation combined with Local Anesthesia Indication(s): Analgesia and Anxiety Route: Intravenous (IV) IV Access: Secured Sedation: Meaningful verbal contact was maintained at all times during the procedure  Local Anesthetic: Lidocaine 1-2%  Position: Prone   Indications: 1. Lumbar facet syndrome (Bilateral)   2. Lumbar facet arthropathy (L3-4, L4-5, and L5-S1) (Bilateral)   3. 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 Spondylosis without myelopathy or radiculopathy, lumbosacral region   6. DDD (degenerative disc disease), lumbosacral   7. Chronic low back pain (1ry area of Pain)  (Bilateral) w/o sciatica   8. Chronic anticoagulation (COUMADIN)    Pain Score: Pre-procedure: 10-Worst pain ever/10 Post-procedure: 2 /10   The patient came into the clinic today again requesting to change the plan from the when a right-sided lumbar facet block to a bilateral block.  This time, I had to explain to her that I would not be able to comply with her request since it has become a habit to schedule one side and then request to have both done.  The problem with this is that it greatly interferes with the flow of the schedule since we had to change order, precharting notes, and of course overall she ends up getting more steroids, which I have already mentioned to her several times that I am concerned on how she continues to rely on these therapies instead of working on losing the weight.  Unfortunately, the longer she stays on the same weight, the more pressure there is over her facet joints and the more they are deteriorating causing significant progression of her condition.  Today again she has requested a Toradol/Norflex IM injection after the procedure.  This has become a habit with her and she knows exactly what to complain about in order to get the IM injection.  Again, I am concerned about the habits that she is developing this interventions.  I have addressed this with the patient several times and it still takes place.  Pre-op H&P Assessment:  Kara [name] is a 70 y.o. (year old), female patient, seen today for interventional treatment. She  has a past surgical history that includes Knee surgery; Carpal tunnel release (Bilateral); Colon resection sigmoid (N/A, 11/02/2016); Colostomy (N/A, 11/02/2016); Sigmoidoscopy (N/A, 11/13/2016); PULMONARY VENOGRAPHY (N/A, 11/19/2016); Colonoscopy with propofol (N/A,  02/03/2017); Colon surgery (11/02/2016); IVC FILTER INSERTION (Right, 10/2016); Colostomy reversal (N/A, 02/24/2017); Appendectomy (N/A, 02/24/2017); Lysis of adhesion (N/A,  02/24/2017); laparoscopy (N/A, 02/24/2017); Ileo loop diversion (N/A, 02/24/2017); Sigmoidoscopy (N/A, 05/19/2017); Ileostomy closure (N/A, 06/01/2017); IVC FILTER REMOVAL (N/A, 08/09/2017); Sacroplasty (N/A, 11/08/2017); Cataract extraction w/PHACO (Left, 11/21/2018); and Cataract extraction w/PHACO (Right, 12/12/2018). Kara [name] has a current medication list which includes the following prescription(s): alendronate, candesartan, cholecalciferol, cyclobenzaprine, dulaglutide, gabapentin, gabapentin, glipizide, hydrocodone-acetaminophen, meclizine, multi-vitamins, nystatin cream, ondansetron, ropinirole, tolterodine, tramadol, vitamin b-12, warfarin, aspirin ec, calcium carbonate, hydrocodone-acetaminophen, and hydrocodone-acetaminophen, and the following Facility-Administered Medications: fentanyl and midazolam. Her primarily concern today is the Back Pain (Low bilateral, right is worse)  Initial Vital Signs:  Pulse/HCG Rate: 87ECG Heart Rate: 88 Temp: (!) 97 F (36.1 C) Resp: 18 BP: (!) 145/78 SpO2: 98 %  BMI: Estimated body mass index is 36.49 kg/m as calculated from the following:   Height as of this encounter: 5\' 3"  (1.6 m).   Weight as of this encounter: 206 lb (93.4 kg).  Risk Assessment: Allergies: Reviewed. She is allergic to adhesive [tape], other, latex, and morphine and related.  Allergy Precautions: None required Coagulopathies: Reviewed. None identified.  Blood-thinner therapy: None at this time Active Infection(s): Reviewed. None identified. Ms. Jahnessa Vanduyn is afebrile  Site Confirmation: Kara [name] was asked to confirm the procedure and laterality before marking the site Procedure checklist: Completed Consent: Before the procedure and under the influence of no sedative(s), amnesic(s), or anxiolytics, the patient was informed of the treatment options, risks and possible complications. To fulfill our ethical and legal obligations, as recommended by the American  Medical Association's Code of Ethics, I have informed the patient of my clinical impression; the nature and purpose of the treatment or procedure; the risks, benefits, and possible complications of the intervention; the alternatives, including doing nothing; the risk(s) and benefit(s) of the alternative treatment(s) or procedure(s); and the risk(s) and benefit(s) of doing nothing. The patient was provided information about the general risks and possible complications associated with the procedure. These may include, but are not limited to: failure to achieve desired goals, infection, bleeding, organ or nerve damage, allergic reactions, paralysis, and death. In addition, the patient was informed of those risks and complications associated to Spine-related procedures, such as failure to decrease pain; infection (i.e.: Meningitis, epidural or intraspinal abscess); bleeding (i.e.: epidural hematoma, subarachnoid hemorrhage, or any other type of intraspinal or peri-dural bleeding); organ or nerve damage (i.e.: Any type of peripheral nerve, nerve root, or spinal cord injury) with subsequent damage to sensory, motor, and/or autonomic systems, resulting in permanent pain, numbness, and/or weakness of one or several areas of the body; allergic reactions; (i.e.: anaphylactic reaction); and/or death. Furthermore, the patient was informed of those risks and complications associated with the medications. These include, but are not limited to: allergic reactions (i.e.: anaphylactic or anaphylactoid reaction(s)); adrenal axis suppression; blood sugar elevation that in diabetics may result in ketoacidosis or comma; water retention that in patients with history of congestive heart failure may result in shortness of breath, pulmonary edema, and decompensation with resultant heart failure; weight gain; swelling or edema; medication-induced neural toxicity; particulate matter embolism and blood vessel occlusion with resultant organ,  and/or nervous system infarction; and/or aseptic necrosis of one or more joints. Finally, the patient was informed that Medicine is not an exact science; therefore, there is also the possibility of unforeseen or unpredictable risks and/or possible complications that may result in a catastrophic outcome. The patient  indicated having understood very clearly. We have given the patient no guarantees and we have made no promises. Enough time was given to the patient to ask questions, all of which were answered to the patient's satisfaction. Ms. Alexa Blish has indicated that she wanted to continue with the procedure. Attestation: I, the ordering provider, attest that I have discussed with the patient the benefits, risks, side-effects, alternatives, likelihood of achieving goals, and potential problems during recovery for the procedure that I have provided informed consent. Date  Time: 09/30/2020  7:55 AM  Pre-Procedure Preparation:  Monitoring: As per clinic protocol. Respiration, ETCO2, SpO2, BP, heart rate and rhythm monitor placed and checked for adequate function Safety Precautions: Patient was assessed for positional comfort and pressure points before starting the procedure. Time-out: I initiated and conducted the "Time-out" before starting the procedure, as per protocol. The patient was asked to participate by confirming the accuracy of the "Time Out" information. Verification of the correct person, site, and procedure were performed and confirmed by me, the nursing staff, and the patient. "Time-out" conducted as per Joint Commission's Universal Protocol (UP.01.01.01). Time: 0901  Description of Procedure:          Laterality: Right Levels:  L2, L3, L4, L5, & S1 Medial Branch Level(s) Area Prepped: Posterior Lumbosacral Region DuraPrep (Iodine Povacrylex [0.7% available iodine] and Isopropyl Alcohol, 74% w/w) Safety Precautions: Aspiration looking for blood return was conducted prior to all  injections. At no point did we inject any substances, as a needle was being advanced. Before injecting, the patient was told to immediately notify me if she was experiencing any new onset of "ringing in the ears, or metallic taste in the mouth". No attempts were made at seeking any paresthesias. Safe injection practices and needle disposal techniques used. Medications properly checked for expiration dates. SDV (single dose vial) medications used. After the completion of the procedure, all disposable equipment used was discarded in the proper designated medical waste containers. Local Anesthesia: Protocol guidelines were followed. The patient was positioned over the fluoroscopy table. The area was prepped in the usual manner. The time-out was completed. The target area was identified using fluoroscopy. A 12-in long, straight, sterile hemostat was used with fluoroscopic guidance to locate the targets for each level blocked. Once located, the skin was marked with an approved surgical skin marker. Once all sites were marked, the skin (epidermis, dermis, and hypodermis), as well as deeper tissues (fat, connective tissue and muscle) were infiltrated with a small amount of a short-acting local anesthetic, loaded on a 10cc syringe with a 25G, 1.5-in  Needle. An appropriate amount of time was allowed for local anesthetics to take effect before proceeding to the next step. Local Anesthetic: Lidocaine 2.0% The unused portion of the local anesthetic was discarded in the proper designated containers. Technical explanation of process:  L2 Medial Branch Nerve Block (MBB): The target area for the L2 medial branch is at the junction of the postero-lateral aspect of the superior articular process and the superior, posterior, and medial edge of the transverse process of L3. Under fluoroscopic guidance, a Quincke needle was inserted until contact was made with os over the superior postero-lateral aspect of the pedicular shadow  (target area). After negative aspiration for blood, 0.5 mL of the nerve block solution was injected without difficulty or complication. The needle was removed intact. L3 Medial Branch Nerve Block (MBB): The target area for the L3 medial branch is at the junction of the postero-lateral aspect of the superior  articular process and the superior, posterior, and medial edge of the transverse process of L4. Under fluoroscopic guidance, a Quincke needle was inserted until contact was made with os over the superior postero-lateral aspect of the pedicular shadow (target area). After negative aspiration for blood, 0.5 mL of the nerve block solution was injected without difficulty or complication. The needle was removed intact. L4 Medial Branch Nerve Block (MBB): The target area for the L4 medial branch is at the junction of the postero-lateral aspect of the superior articular process and the superior, posterior, and medial edge of the transverse process of L5. Under fluoroscopic guidance, a Quincke needle was inserted until contact was made with os over the superior postero-lateral aspect of the pedicular shadow (target area). After negative aspiration for blood, 0.5 mL of the nerve block solution was injected without difficulty or complication. The needle was removed intact. L5 Medial Branch Nerve Block (MBB): The target area for the L5 medial branch is at the junction of the postero-lateral aspect of the superior articular process and the superior, posterior, and medial edge of the sacral ala. Under fluoroscopic guidance, a Quincke needle was inserted until contact was made with os over the superior postero-lateral aspect of the pedicular shadow (target area). After negative aspiration for blood, 0.5 mL of the nerve block solution was injected without difficulty or complication. The needle was removed intact. S1 Medial Branch Nerve Block (MBB): The target area for the S1 medial branch is at the posterior and inferior 6  o'clock position of the L5-S1 facet joint. Under fluoroscopic guidance, the Quincke needle inserted for the L5 MBB was redirected until contact was made with os over the inferior and postero aspect of the sacrum, at the 6 o' clock position under the L5-S1 facet joint (Target area). After negative aspiration for blood, 0.5 mL of the nerve block solution was injected without difficulty or complication. The needle was removed intact.  Nerve block solution: 0.2% PF-Ropivacaine + Triamcinolone (40 mg/mL) diluted to a final concentration of 4 mg of Triamcinolone/mL of Ropivacaine The unused portion of the solution was discarded in the proper designated containers. Procedural Needles: 22-gauge, 5-inch, Quincke needles used for all levels.  Once the entire procedure was completed, the treated area was cleaned, making sure to leave some of the prepping solution back to take advantage of its long term bactericidal properties.      Illustration of the posterior view of the lumbar spine and the posterior neural structures. Laminae of L2 through S1 are labeled. DPRL5, dorsal primary ramus of L5; DPRS1, dorsal primary ramus of S1; DPR3, dorsal primary ramus of L3; FJ, facet (zygapophyseal) joint L3-L4; I, inferior articular process of L4; LB1, lateral branch of dorsal primary ramus of L1; IAB, inferior articular branches from L3 medial branch (supplies L4-L5 facet joint); IBP, intermediate branch plexus; MB3, medial branch of dorsal primary ramus of L3; NR3, third lumbar nerve root; S, superior articular process of L5; SAB, superior articular branches from L4 (supplies L4-5 facet joint also); TP3, transverse process of L3.  Vitals:   09/30/20 0911 09/30/20 0919 09/30/20 0929 09/30/20 0940  BP: (!) 149/107 135/84 118/68 127/69  Pulse:      Resp: 13 16 16 16   Temp:  (!) 97 F (36.1 C)    SpO2: 98% 96% 100% 98%  Weight:      Height:         Start Time: 0901 hrs. End Time: 0907 hrs.  Imaging Guidance  (Spinal):  Type of Imaging Technique: Fluoroscopy Guidance (Spinal) Indication(s): Assistance in needle guidance and placement for procedures requiring needle placement in or near specific anatomical locations not easily accessible without such assistance. Exposure Time: Please see nurses notes. Contrast: None used. Fluoroscopic Guidance: I was personally present during the use of fluoroscopy. "Tunnel Vision Technique" used to obtain the best possible view of the target area. Parallax error corrected before commencing the procedure. "Direction-depth-direction" technique used to introduce the needle under continuous pulsed fluoroscopy. Once target was reached, antero-posterior, oblique, and lateral fluoroscopic projection used confirm needle placement in all planes. Images permanently stored in EMR. Interpretation: No contrast injected. I personally interpreted the imaging intraoperatively. Adequate needle placement confirmed in multiple planes. Permanent images saved into the patient's record.  Antibiotic Prophylaxis:   Anti-infectives (From admission, onward)   None     Indication(s): None identified  Post-operative Assessment:  Post-procedure Vital Signs:  Pulse/HCG Rate: 8779 Temp: (!) 97 F (36.1 C) Resp: 16 BP: 127/69 SpO2: 98 %  EBL: None  Complications: No immediate post-treatment complications observed by team, or reported by patient.  Note: The patient tolerated the entire procedure well. A repeat set of vitals were taken after the procedure and the patient was kept under observation following institutional policy, for this type of procedure. Post-procedural neurological assessment was performed, showing return to baseline, prior to discharge. The patient was provided with post-procedure discharge instructions, including a section on how to identify potential problems. Should any problems arise concerning this procedure, the patient was given instructions to immediately  contact us, at any time, without hesitation. In any case, we plan to contact the patient by telephone for a follow-up status report regarding this interventional procedure.  Comments:  No additional relevant information.  Plan of Care  Orders:  Orders Placed This Encounter  Procedures  . LUMBAR FACET(MEDIAL BRANCH NERVE BLOCK) MBNB    Scheduling Instructions:     Procedure: Lumbar facet block (AKA.: Lumbosacral medial branch nerve block)     Side: Right-sided     Level: L3-4, L4-5, & L5-S1 Facets (L2, L3, L4, L5, & S1 Medial Branch Nerves)     Sedation: Patient's choice.     Timeframe: Today    Order Specific Question:   Where will this procedure be performed?    Answer:   [name]  . DG PAIN CLINIC C-ARM 1-60 MIN NO REPORT    Intraoperative interpretation by procedural physician at [facility].    Standing Status:   Standing    Number of Occurrences:   1    Order Specific Question:   Reason for exam:    Answer:   Assistance in needle guidance and placement for procedures requiring needle placement in or near specific anatomical locations not easily accessible without such assistance.  . Informed Consent Details: Physician/Practitioner Attestation; Transcribe to consent form and obtain patient signature    Nursing Order: Transcribe to consent form and obtain patient signature. Note: Always confirm laterality of pain with Ms. Inza Mikrut, before procedure.    Order Specific Question:   Physician/Practitioner attestation of informed consent for procedure/surgical case    Answer:   I, the physician/practitioner, attest that I have discussed with the patient the benefits, risks, side effects, alternatives, likelihood of achieving goals and potential problems during recovery for the procedure that I have provided informed consent.    Order Specific Question:   Procedure    Answer:   Lumbar Facet Block  under fluoroscopic guidance  Order Specific Question:    Physician/Practitioner performing the procedure    Answer:   Jarman Litton A. Dossie Arbour MD    Order Specific Question:   Indication/Reason    Answer:   Low Back Pain, with our without leg pain, due to Facet Joint Arthralgia (Joint Pain) Spondylosis (Arthritis of the Spine), without myelopathy or radiculopathy (Nerve Damage).  . Care order/instruction: Please confirm that the patient has stopped the Coumadin (Warfarin) X 5 days prior to procedure or surgery.    Please confirm that the patient has stopped the Coumadin (Warfarin) X 5 days prior to procedure or surgery.    Standing Status:   Standing    Number of Occurrences:   1  . Provide equipment / supplies at bedside    "Block Tray" (Disposable  single use) Needle type: SpinalSpinal Amount/quantity: 4 Size: Medium (5-inch) Gauge: 22G    Standing Status:   Standing    Number of Occurrences:   1    Order Specific Question:   Specify    Answer:   Block Tray  . Bleeding precautions    Standing Status:   Standing    Number of Occurrences:   1   Chronic Opioid Analgesic:  Hydrocodone/APAP 5/325, 1 tab PO QD PRN (5 mg/day of hydrocodone) (5 MME) + tramadol 50 mg, 1 tab p.o. every 8 hours (150 mg/day of tramadol) (15 MME) MME: 20 mg/day.   Medications ordered for procedure: Meds ordered this encounter  Medications  . lidocaine (XYLOCAINE) 2 % (with pres) injection 400 mg  . lactated ringers infusion 1,000 mL  . midazolam (VERSED) 5 MG/5ML injection 1-2 mg    Make sure Flumazenil is available in the pyxis when using this medication. If oversedation occurs, administer 0.2 mg IV over 15 sec. If after 45 sec no response, administer 0.2 mg again over 1 min; may repeat at 1 min intervals; not to exceed 4 doses (1 mg)  . fentaNYL (SUBLIMAZE) injection 25-50 mcg    Make sure Narcan is available in the pyxis when using this medication. In the event of respiratory depression (RR< 8/min): Titrate NARCAN (naloxone) in increments of 0.1 to 0.2 mg IV at 2-3  minute intervals, until desired degree of reversal.  . ropivacaine (PF) 2 mg/mL (0.2%) (NAROPIN) injection 9 mL  . triamcinolone acetonide (KENALOG-40) injection 40 mg  . ketorolac (TORADOL) injection 60 mg  . orphenadrine (NORFLEX) injection 60 mg   Medications administered: We administered lidocaine, lactated ringers, midazolam, fentaNYL, ropivacaine (PF) 2 mg/mL (0.2%), triamcinolone acetonide, ketorolac, and orphenadrine.  See the medical record for exact dosing, route, and time of administration.  Follow-up plan:   Return in about 2 weeks (around 10/14/2020) for procedure day, (afternoon VV), (PPE).       Interventional Therapies  Risk  Complexity  Complicating Factors:   Estimated body mass index is 36.49 kg/m as calculated from the following:   Height as of this encounter: 5\' 3"  (1.6 m).   Weight as of this encounter: 206 lb (93.4 kg). 1.  Coumadin anticoagulation (Stop:5days  Re-start: 2hrs) 2.  Latex allergy  3.  Morbid obesity (class III) (BMI>40)  4.  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   Completed:   Palliative/Therapeutic left L4-5 LESI x1(11/10/2017) Palliative left lumbar facet blockx14(06/24/2020) Palliativeright lumbar facet block(s) x19(08/05/2020) Palliative right SI joint block x8(not as effective as facet Blk) (  03/08/2019) Diagnostic left SI joint block x1 (not as effective as facet Blk) (03/08/2019) Palliative/Therapeutic (Midline) caudalESIx3(06/08/2018)   Palliative options:   Palliative/Therapeutic left L4-5 LESI #2 Palliative left lumbar facet block#15 Palliativeright lumbar facet block(s) #19 Palliative right SI joint block #9 Diagnostic left SI joint block #2  Palliative/Therapeutic (Midline) caudalESI#4     Recent Visits Date Type Provider Dept  08/19/20 Telemedicine Milinda Pointer, MD [name]  08/05/20 Procedure visit Milinda Pointer, MD [name]  08/04/20 Office Visit Milinda Pointer, MD [name]  07/14/20 Office Visit Milinda Pointer, MD [name]  Showing recent visits within past 90 days and meeting all other requirements Today's Visits Date Type Provider Dept  09/30/20 Procedure visit Milinda Pointer, MD [name]  Showing today's visits and meeting all other requirements Future Appointments Date Type Provider Dept  10/21/20 Appointment Milinda Pointer, MD [name]  10/22/20 Appointment Milinda Pointer, MD [name]  Showing future appointments within next 90 days and meeting all other requirements  Disposition: Discharge home  Discharge (Date  Time): 09/30/2020; 0941 hrs.   Primary Care Physician: Tracie Harrier, MD Location: [facility] Note by: Gaspar Cola, MD Date: 09/30/2020; Time: 9:52 AM  Disclaimer:  Medicine is not an Chief Strategy Officer. The only guarantee in medicine is that nothing is guaranteed. It is important to note that the decision to proceed with this intervention was based on the information collected from the patient. The Data and conclusions were drawn from the patient's questionnaire, the interview, and the physical examination. Because the information was provided in large part by the patient, it cannot be guaranteed that it has not been purposely or unconsciously manipulated. Every effort has been made to obtain as much relevant data as possible for this evaluation. It is important to note that the conclusions that lead to this procedure are derived in large part from the available data. Always take into account that the treatment will also be dependent on availability of resources and existing treatment guidelines, considered by other Pain Management Practitioners as being common knowledge and practice, at the time of  the intervention. For Medico-Legal purposes, it is also important to point out that variation in procedural techniques and pharmacological choices are the acceptable norm. The indications, contraindications, technique, and results of the above procedure should only be interpreted and judged by a Board-Certified Interventional Pain Specialist with extensive familiarity and expertise in the same exact procedure and technique.

## 2020-09-30 NOTE — Progress Notes (Signed)
Safety precautions to be maintained throughout the outpatient stay will include: orient to surroundings, keep bed in low position, maintain call bell within reach at all times, provide assistance with transfer out of bed and ambulation.  

## 2020-09-30 NOTE — Patient Instructions (Addendum)
____________________________________________________________________________________________  Post-Procedure Discharge Instructions  Instructions:  Apply ice:   Purpose: This will minimize any swelling and discomfort after procedure.   When: Day of procedure, as soon as you get home.  How: Fill a plastic sandwich bag with crushed ice. Cover it with a small towel and apply to injection site.  How long: (15 min on, 15 min off) Apply for 15 minutes then remove x 15 minutes.  Repeat sequence on day of procedure, until you go to bed.  Apply heat:   Purpose: To treat any soreness and discomfort from the procedure.  When: Starting the next day after the procedure.  How: Apply heat to procedure site starting the day following the procedure.  How long: May continue to repeat daily, until discomfort goes away.  Food intake: Start with clear liquids (like water) and advance to regular food, as tolerated.   Physical activities: Keep activities to a minimum for the first 8 hours after the procedure. After that, then as tolerated.  Driving: If you have received any sedation, be responsible and do not drive. You are not allowed to drive for 24 hours after having sedation.  Blood thinner: (Applies only to those taking blood thinners) You may restart your blood thinner 6 hours after your procedure.  Insulin: (Applies only to Diabetic patients taking insulin) As soon as you can eat, you may resume your normal dosing schedule.  Infection prevention: Keep procedure site clean and dry. Shower daily and clean area with soap and water.  Post-procedure Pain Diary: Extremely important that this be done correctly and accurately. Recorded information will be used to determine the next step in treatment. For the purpose of accuracy, follow these rules:  Evaluate only the area treated. Do not report or include pain from an untreated area. For the purpose of this evaluation, ignore all other areas of pain,  except for the treated area.  After your procedure, avoid taking a long nap and attempting to complete the pain diary after you wake up. Instead, set your alarm clock to go off every hour, on the hour, for the initial 8 hours after the procedure. Document the duration of the numbing medicine, and the relief you are getting from it.  Do not go to sleep and attempt to complete it later. It will not be accurate. If you received sedation, it is likely that you were given a medication that may cause amnesia. Because of this, completing the diary at a later time may cause the information to be inaccurate. This information is needed to plan your care.  Follow-up appointment: Keep your post-procedure follow-up evaluation appointment after the procedure (usually 2 weeks for most procedures, 6 weeks for radiofrequencies). DO NOT FORGET to bring you pain diary with you.   Expect: (What should I expect to see with my procedure?)  From numbing medicine (AKA: Local Anesthetics): Numbness or decrease in pain. You may also experience some weakness, which if present, could last for the duration of the local anesthetic.  Onset: Full effect within 15 minutes of injected.  Duration: It will depend on the type of local anesthetic used. On the average, 1 to 8 hours.   From steroids (Applies only if steroids were used): Decrease in swelling or inflammation. Once inflammation is improved, relief of the pain will follow.  Onset of benefits: Depends on the amount of swelling present. The more swelling, the longer it will take for the benefits to be seen. In some cases, up to 10 days.  Duration: Steroids will stay in the system x 2 weeks. Duration of benefits will depend on multiple posibilities including persistent irritating factors.  Side-effects: If present, they may typically last 2 weeks (the duration of the steroids).  Frequent: Cramps (if they occur, drink Gatorade and take over-the-counter Magnesium 450-500 mg  once to twice a day); water retention with temporary weight gain; increases in blood sugar; decreased immune system response; increased appetite.  Occasional: Facial flushing (red, warm cheeks); mood swings; menstrual changes.  Uncommon: Long-term decrease or suppression of natural hormones; bone thinning. (These are more common with higher doses or more frequent use. This is why we prefer that our patients avoid having any injection therapies in other practices.)   Very Rare: Severe mood changes; psychosis; aseptic necrosis.  From procedure: Some discomfort is to be expected once the numbing medicine wears off. This should be minimal if ice and heat are applied as instructed.  Call if: (When should I call?)  You experience numbness and weakness that gets worse with time, as opposed to wearing off.  New onset bowel or bladder incontinence. (Applies only to procedures done in the spine)  Emergency Numbers:  Durning business hours (Monday - Thursday, 8:00 AM - 4:00 PM) (Friday, 9:00 AM - 12:00 Noon): [phone]  After hours: [phone]  NOTE: If you are having a problem and are unable connect with, or to talk to a provider, then go to your nearest urgent care or emergency department. If the problem is serious and urgent, please call 911. ____________________________________________________________________________________________    ______________________________________________________________________________________________  Body mass index (BMI)  Body mass index (BMI) is a common tool for deciding whether a person has an appropriate body weight.  It measures a persons weight in relation to their height.   According to the [employer] of health (NIH): Marland Kitchen A BMI of less than 18.5 means that a person is underweight. . A BMI of between 18.5 and 24.9 is ideal. . A BMI of between 25 and 29.9 is overweight. . A BMI over 30 indicates obesity.  Weight Management  Required  URGENT: Your weight has been found to be adversely affecting your health.  Dear Kara [name]:  Your current Estimated body mass index is 36.85 kg/m as calculated from the following:   Height as of 08/14/20: 5' 3" (1.6 m).   Weight as of 08/14/20: 208 lb (94.3 kg).  Please use the table below to identify your weight category and associated incidence of chronic pain, secondary to your weight.  Body Mass Index (BMI) Classification BMI level (kg/m2) Category Associated incidence of chronic pain  <18  Underweight   18.5-24.9 Ideal body weight   25-29.9 Overweight  20%  30-34.9 Obese (Class I)  68%  35-39.9 Severe obesity (Class II)  136%  >40 Extreme obesity (Class III)  254%   In addition: You will be considered "Morbidly Obese", if your BMI is above 30 and you have one or more of the following conditions which are known to be caused and/or directly associated with obesity: 1.    Type 2 Diabetes (Which in turn can lead to cardiovascular diseases (CVD), stroke, peripheral vascular diseases (PVD), retinopathy, nephropathy, and neuropathy) 2.    Cardiovascular Disease (High Blood Pressure; Congestive Heart Failure; High Cholesterol; Coronary Artery Disease; Angina; or History of Heart Attacks) 3.    Breathing problems (Asthma; obesity-hypoventilation syndrome; obstructive sleep apnea; chronic inflammatory airway disease; reactive airway disease; or shortness of breath) 4.    Chronic  kidney disease 5.    Liver disease (nonalcoholic fatty liver disease) 6.    High blood pressure 7.    Acid reflux (gastroesophageal reflux disease; heartburn) 8.    Osteoarthritis (OA) (with any of the following: hip pain; knee pain; and/or low back pain) 9.    Low back pain (Lumbar Facet Syndrome; and/or Degenerative Disc Disease) 10.  Hip pain (Osteoarthritis of hip) (For every 1 lbs of added body weight, there is a 2 lbs increase in pressure inside of each hip articulation. 1:2 mechanical  relationship) 11.  Knee pain (Osteoarthritis of knee) (For every 1 lbs of added body weight, there is a 4 lbs increase in pressure inside of each knee articulation. 1:4 mechanical relationship) (patients with a BMI>30 kg/m2 were 6.8 times more likely to develop knee OA than normal-weight individuals) 12.  Cancer: Epidemiological studies have shown that obesity is a risk factor for: post-menopausal breast cancer; cancers of the endometrium, colon and kidney cancer; malignant adenomas of the oesophagus. Obese subjects have an approximately 1.5-3.5-fold increased risk of developing these cancers compared with normal-weight subjects, and it has been estimated that between 15 and 45% of these cancers can be attributed to overweight. More recent studies suggest that obesity may also increase the risk of other types of cancer, including pancreatic, hepatic and gallbladder cancer. (Ref: Obesity and cancer. Pischon T, Nthlings U, [employer] H. Proc Nutr Soc. 2008 May;67(2):128-45. doi: 14.3888/L5797282060156153.) The International Agency for Research on Cancer (IARC) has identified 13 cancers associated with overweight and obesity: meningioma, multiple myeloma, adenocarcinoma of the esophagus, and cancers of the thyroid, postmenopausal breast cancer, gallbladder, stomach, liver, pancreas, kidney, ovaries, uterus, colon and rectal (colorectal) cancers. 76 percent of all cancers diagnosed in women and 24 percent of those diagnosed in men are associated with overweight and obesity.  Recommendation: At this point it is urgent that you take a step back and concentrate in loosing weight. Dedicate 100% of your efforts on this task. Nothing else will improve your health more than bringing your weight down and your BMI to less than 30. If you are here, you probably have chronic pain. We know that most chronic pain patients have difficulty exercising secondary to their pain. For this reason, you must rely on proper nutrition and diet  in order to lose the weight. If your BMI is above 40, you should seriously consider bariatric surgery. A realistic goal is to lose 10% of your body weight over a period of 12 months.  Be honest to yourself, if over time you have unsuccessfully tried to lose weight, then it is time for you to seek professional help and to enter a medically supervised weight management program, and/or undergo bariatric surgery. Stop procrastinating.   Pain management considerations:  1.    Pharmacological Problems: Be advised that the use of opioid analgesics (oxycodone; hydrocodone; morphine; methadone; codeine; and all of their derivatives) have been associated with decreased metabolism and weight gain.  For this reason, should we see that you are unable to lose weight while taking these medications, it may become necessary for [place] to taper down and indefinitely discontinue them.  2.    Technical Problems: The incidence of successful interventional therapies decreases as the patient's BMI increases. It is much more difficult to accomplish a safe and effective interventional therapy on a patient with a BMI above 35. 3.    Radiation Exposure Problems: The x-rays machine, used to accomplish injection therapies, will automatically increase their x-ray output in order  to capture an appropriate bone image. This means that radiation exposure increases exponentially with the patient's BMI. (The higher the BMI, the higher the radiation exposure.) Although the level of radiation used at a given time is still safe to the patient, it is not for the physician and/or assisting staff. Unfortunately, radiation exposure is accumulative. Because physicians and the staff have to do procedures and be exposed on a daily basis, this can result in health problems such as cancer and radiation burns. Radiation exposure to the staff is monitored by the radiation batches that they wear. The exposure levels are reported back to the staff on a quarterly  basis. Depending on levels of exposure, physicians and staff may be obligated by law to decrease this exposure. This means that they have the right and obligation to refuse providing therapies where they may be overexposed to radiation. For this reason, physicians may decline to offer therapies such as radiofrequency ablation or implants to patients with a BMI above 40. 4.    Current Trends: Be advised that the current trend is to no longer offer certain therapies to patients with a BMI equal to, or above 35, due to increase perioperative risks, increased technical procedural difficulties, and excessive radiation exposure to healthcare personnel.  ______________________________________________________________________________________________    Post-procedure Information What to expect: Most procedures involve the use of a local anesthetic (numbing medicine), and a steroid (anti-inflammatory medicine).  The local anesthetics may cause temporary numbness and weakness of the legs or arms, depending on the location of the block. This numbness/weakness may last 4-6 hours, depending on the local anesthetic used. In rare instances, it can last up to 24 hours. While numb, you must be very careful not to injure the extremity.  After any procedure, you could expect the pain to get better within 15-20 minutes. This relief is temporary and may last 4-6 hours. Once the local anesthetics wears off, you could experience discomfort, possibly more than usual, for up to 10 (ten) days. In the case of radiofrequencies, it may last up to 6 weeks. Surgeries may take up to 8 weeks for the healing process. The discomfort is due to the irritation caused by needles going through skin and muscle. To minimize the discomfort, we recommend using ice the first day, and heat from then on. The ice should be applied for 15 minutes on, and 15 minutes off. Keep repeating this cycle until bedtime. Avoid applying the ice directly to the  skin, to prevent frostbite. Heat should be used daily, until the pain improves (4-10 days). Be careful not to burn yourself.  Occasionally you may experience muscle spasms or cramps. These occur as a consequence of the irritation caused by the needle sticks to the muscle and the blood that will inevitably be lost into the surrounding muscle tissue. Blood tends to be very irritating to tissues, which tend to react by going into spasm. These spasms may start the same day of your procedure, but they may also take days to develop. This late onset type of spasm or cramp is usually caused by electrolyte imbalances triggered by the steroids, at the level of the kidney. Cramps and spasms tend to respond well to muscle relaxants, multivitamins (some are triggered by the procedure, but may have their origins in vitamin deficiencies), and "Gatorade", or any sports drinks that can replenish any electrolyte imbalances. (If you are a diabetic, ask your pharmacist to get you a sugar-free brand.) Warm showers or baths may also be helpful. Stretching  exercises are highly recommended. General Instructions:  Be alert for signs of possible infection: redness, swelling, heat, red streaks, elevated temperature, and/or fever. These typically appear 4 to 6 days after the procedure. Immediately notify your doctor if you experience unusual bleeding, difficulty breathing, or loss of bowel or bladder control. If you experience increased pain, do not increase your pain medicine intake, unless instructed by your pain physician. Post-Procedure Care:  Be careful in moving about. Muscle spasms in the area of the injection may occur. Applying ice or heat to the area is often helpful. The incidence of spinal headaches after epidural injections ranges between 1.4% and 6%. If you develop a headache that does not seem to respond to conservative therapy, please let your physician know. This can be treated with an epidural blood  patch.   Post-procedure numbness or redness is to be expected, however it should average 4 to 6 hours. If numbness and weakness of your extremities begins to develop 4 to 6 hours after your procedure, and is felt to be progressing and worsening, immediately contact your physician.   Diet:  If you experience nausea, do not eat until this sensation goes away. If you had a "Stellate Ganglion Block" for upper extremity "Reflex Sympathetic Dystrophy", do not eat or drink until your hoarseness goes away. In any case, always start with liquids first and if you tolerate them well, then slowly progress to more solid foods. Activity:  For the first 4 to 6 hours after the procedure, use caution in moving about as you may experience numbness and/or weakness. Use caution in cooking, using household electrical appliances, and climbing steps. If you need to reach your Doctor call our office: [phone] Monday-Thursday 8:00 am - 4:00 PM    Fridays: Closed     In case of an emergency: In case of emergency, call 911 or go to the nearest emergency room and have the physician there call us.  Interpretation of Procedure Every nerve block has two components: a diagnostic component, and a treatment component. Unrealistic expectations are the most common causes of "perceived failure".  In a perfect world, a single nerve block should be able to completely and permanently eliminate the pain. Sadly, the world is not perfect.  Most pain management nerve blocks are performed using local anesthetics and steroids. Steroids are responsible for any long-term benefit that you may experience. Their purpose is to decrease any chronic swelling that may exist in the area. Steroids begin to work immediately after being injected. However, most patients will not experience any benefits until 5 to 10 days after the injection, when the swelling has come down to the point where they can tell a difference. Steroids will only help if there  is swelling to be treated. As such, they can assist with the diagnosis. If effective, they suggest an inflammatory component to the pain, and if ineffective, they rule out inflammation as the main cause or component of the problem. If the problem is one of mechanical compression, you will get no benefit from those steroids.   In the case of local anesthetics, they have a crucial role in the diagnosis of your condition. Most will begin to work within15 to 20 minutes after injection. The duration will depend on the type used (short- vs. Long-acting). It is of outmost importance that patients keep tract of their pain, after the procedure. To assist with this matter, a "Post-procedure Pain Diary" is provided. Make sure to complete it and to bring it  back to your follow-up appointment.  As long as the patient keeps accurate, detailed records of their symptoms after every procedure, and returns to have those interpreted, every procedure will provide [place] with invaluable information. Even a block that does not provide the patient with any relief, will always provide [place] with information about the mechanism and the origin of the pain. The only time a nerve block can be considered a waste of time is when patients do not keep track of the results, or do not keep their post-procedure appointment.  Reporting the results back to your physician The Pain Score  Pain is a subjective complaint. It cannot be seen, touched, or measured. We depend entirely on the patient's report of the pain in order to assess your condition and treatment. To evaluate the pain, we use a pain scale, where "0" means "No Pain", and a "10" is "the worst possible pain that you can even imagine" (i.e. something like been eaten alive by a shark or being torn apart by a lion).   You will frequently be asked to rate your pain. Please be as accurate, remember that medical decisions will be based on your responses. Please do not rate your pain above a 10.  Doing so is actually interpreted as "symptom magnification" (exaggeration), as well as lack of understanding with regards to the scale. To put this into perspective, when you tell us that your pain is at a 10 (ten), what you are saying is that there is nothing we can do to make this pain any worse. (Carefully think about that.)

## 2020-10-01 ENCOUNTER — Telehealth: Payer: Self-pay

## 2020-10-01 NOTE — Telephone Encounter (Signed)
Post procedure phone call.  LM 

## 2020-10-15 ENCOUNTER — Encounter: Payer: PPO | Admitting: Pain Medicine

## 2020-10-17 ENCOUNTER — Ambulatory Visit
Admission: RE | Admit: 2020-10-17 | Discharge: 2020-10-17 | Disposition: A | Discharge status: Home / Self Care | Payer: PPO | Source: Ambulatory Visit | Attending: Internal Medicine | Admitting: Internal Medicine

## 2020-10-17 DIAGNOSIS — M47814 Spondylosis without myelopathy or radiculopathy, thoracic region: Secondary | ICD-10-CM | POA: Diagnosis not present

## 2020-10-17 DIAGNOSIS — K8080 Other cholelithiasis without obstruction: Secondary | ICD-10-CM | POA: Diagnosis not present

## 2020-10-17 DIAGNOSIS — I700 Atherosclerosis of aorta: Secondary | ICD-10-CM | POA: Diagnosis not present

## 2020-10-17 DIAGNOSIS — J984 Other disorders of lung: Secondary | ICD-10-CM | POA: Diagnosis not present

## 2020-10-17 DIAGNOSIS — R918 Other nonspecific abnormal finding of lung field: Secondary | ICD-10-CM | POA: Diagnosis not present

## 2020-10-17 LAB — POCT I-STAT CREATININE: Creatinine, Ser: 0.7 mg/dL (ref 0.44–1.00)

## 2020-10-17 MED ORDER — IOHEXOL 300 MG/ML  SOLN
75.0000 mL | Freq: Once | INTRAMUSCULAR | Status: AC | PRN
  Administered 2020-10-17: 75 mL via INTRAVENOUS

## 2020-10-20 NOTE — Progress Notes (Signed)
Patient: Kara [name]  Service Category: E/M  Provider: Gaspar Cola, MD  DOB: Apr 06, 1951  DOS: 10/21/2020  Location: Office  MRN: [id number]  Setting: Ambulatory outpatient  Referring Provider: Tracie Harrier, MD  Type: Established Patient  Specialty: Interventional Pain Management  PCP: Tracie Harrier, MD  Location: Remote location  Delivery: TeleHealth     Virtual Encounter - Pain Management PROVIDER NOTE: Information contained herein reflects review and annotations entered in association with encounter. Interpretation of such information and data should be left to medically-trained personnel. Information provided to patient can be located elsewhere in the medical record under "Patient Instructions". Document created using STT-dictation technology, any transcriptional errors that may result from process are unintentional.    Contact & Pharmacy Preferred: [phone] Home: [phone] (home) Mobile: [phone] (mobile) E-mail: [email or web address]  [place], [place] [address] Phone: [phone] Fax: [phone]   Pre-screening  Kara [name] offered "in-person" vs "virtual" encounter. She indicated preferring virtual for this encounter.   Reason COVID-19*  Social distancing based on CDC and AMA recommendations.   I contacted Kara [name] on 10/21/2020 via telephone.      I clearly identified myself as Gaspar Cola, MD. I verified that I was speaking with the correct person using two identifiers (Name: Kara [name], and date of birth: [birth year]).  Consent I sought verbal advanced consent from Kara [name] for virtual visit interactions. I informed Kara [name] of possible security and privacy concerns, risks, and limitations associated with providing "not-in-person" medical evaluation and management services. I also informed Kara [name] of the  availability of "in-person" appointments. Finally, I informed her that there would be a charge for the virtual visit and that she could be  personally, fully or partially, financially responsible for it. [name] expressed understanding and agreed to proceed.   Historic Elements   [name] is a 70 y.o. year old, female patient evaluated today after our last contact on 09/30/2020. 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 She also  has a past surgical history that includes Knee surgery; Carpal tunnel release (Bilateral); Colon resection sigmoid (N/A, 11/02/2016); Colostomy (N/A, 11/02/2016); Sigmoidoscopy (N/A, 11/13/2016); PULMONARY VENOGRAPHY (N/A, 11/19/2016); Colonoscopy with propofol (N/A, 02/03/2017); Colon surgery (11/02/2016); IVC FILTER INSERTION (Right, 10/2016); Colostomy reversal (N/A, 02/24/2017); Appendectomy (N/A, 02/24/2017); Lysis of adhesion (N/A, 02/24/2017); laparoscopy (N/A, 02/24/2017); Ileo loop diversion (N/A, 02/24/2017); Sigmoidoscopy (N/A, 05/19/2017); Ileostomy closure (N/A, 06/01/2017); IVC FILTER REMOVAL (N/A, 08/09/2017); Sacroplasty (N/A, 11/08/2017); Cataract extraction w/PHACO (Left, 11/21/2018); and Cataract extraction w/PHACO (Right, 12/12/2018). Kara [name] has a current medication list which includes the following prescription(s): alendronate, aspirin ec, calcium carbonate, candesartan, cholecalciferol, cyclobenzaprine, dulaglutide, gabapentin, gabapentin, glipizide, meclizine, multi-vitamins, nystatin cream, ondansetron, ropinirole, tolterodine, tramadol, vitamin b-12, warfarin, hydrocodone-acetaminophen, hydrocodone-acetaminophen, and hydrocodone-acetaminophen. She   reports that she has never smoked. She has never used smokeless tobacco. She reports that she does not drink alcohol and does not use drugs. Kara [name] is allergic to adhesive [tape], other, latex, and morphine and related.   HPI  Today, she is being contacted for a post-procedure assessment.  Review of the patient's last lumbar MRI done on 08/09/2019 reveals an increased  degenerative anterolisthesis of L4 over L5 and L5 over S1 with canal and foraminal stenosis that has progressed at the L5-S1 level.  She continues to get relatively good relief of the pain for the duration of the local anesthetic, but no long-term improvement.  At this point, we need to consider attempting a radiofrequency ablation despite the fact that she has not lost the weight.  She continues to push to have the steroid injections done closer together and despite my warnings about the steroids, she indicates that the shots seem to be the only thing that is providing her with significant benefit.  She also does not want to be taking narcotics since she is still trying to continue to work.  The patient refers having attained an ongoing 90% relief of the pain on the right side that we treated however, the left side continues to give her problems not only in the lower back but in the buttocks area which is the typical referral area for the facet syndrome.  Today we had a really long conversation regarding the possibility of doing radiofrequency ablation once her BMI comes down below 35.  I have explained to her the procedure, the risk, and the possible complications including that of failure of the procedure to provide her with good relief of the pain as well as the possibility of neuritis.  The patient indicates that at this point she is doing well and does not need any refills on her medications.  She is interested in the radiofrequency ablation and she refers that she has been losing weight.  She has indicated that she will let  me know when we can get the radiofrequency ablation done.  Post-Procedure Evaluation  Procedure (09/30/2020): Palliative right lumbar facet block under fluoroscopic guidance and IV sedation Pre-procedure pain level: 10/10 Post-procedure: 2/10 (> 50% relief)  Sedation: Sedation provided.  Effectiveness during initial hour after procedure(Ultra-Short Term Relief): 90 %.  Local anesthetic used: Long-acting (4-6 hours) Effectiveness: Defined as any analgesic benefit obtained secondary to the administration of local anesthetics. This carries significant diagnostic value as to the etiological location, or anatomical origin, of the pain. Duration of benefit is expected to coincide with the duration of the local anesthetic used.  Effectiveness during initial 4-6 hours after procedure(Short-Term Relief): 90 %.  Long-term benefit: Defined as any relief past the pharmacologic duration of the local anesthetics.  Effectiveness past the initial 6 hours after procedure(Long-Term Relief): 75 % (lasting 18 days).  Current benefits: Defined as benefit that persist at this time.   Analgesia:  Currently she indicates that having any pain on the right side which we treated, but she is still having pain in the left lower back and buttocks area, which is the side that we did not treat. Function: Kara [name] reports improvement in function ROM: Kara [name] reports improvement in ROM  Pharmacotherapy Assessment  Analgesic: Hydrocodone/APAP 5/325, 1 tab PO QD PRN (5 mg/day of hydrocodone) (5 MME) + tramadol 50 mg, 1 tab p.o. every 8 hours (150 mg/day of tramadol) (15 MME) MME: 20 mg/day.   Monitoring: [place] PMP: PDMP reviewed during this encounter.       Pharmacotherapy: No side-effects or adverse reactions reported. Compliance: No problems identified. Effectiveness: Clinically acceptable. Plan: Refer to "POC".  UDS:  Summary  Date Value Ref Range Status  09/20/2019 Note  Final    Comment:     ==================================================================== [facility] (MW) ==================================================================== Test  Result       Flag       Units Drug Absent but Declared for Prescription Verification   Hydrocodone                    Not Detected UNEXPECTED ng/mg creat ==================================================================== Test                      Result    Flag   Units      Ref Range   Creatinine              126              mg/dL      >=20 ==================================================================== Declared Medications:  The flagging and interpretation on this report are based on the  following declared medications.  Unexpected results may arise from  inaccuracies in the declared medications.  **Note: The testing scope of this panel includes these medications:  Hydrocodone  **Note: The testing scope of this panel does not include the  following reported medications:  Acetaminophen  Aspirin  Calcium  Candesartan  Cholecalciferol  Cyanocobalamin  Cyclobenzaprine  Dulaglutide  Gabapentin  Glipizide  Loperamide  Meclizine  Multivitamin  Nystatin  Ropinirole  Warfarin ==================================================================== For clinical consultation, please call [phone]. ====================================================================     Laboratory Chemistry Profile   Renal Lab Results  Component Value Date   BUN 17 08/15/2020   CREATININE 0.70 10/17/2020   GFRAA >60 07/09/2019   GFRNONAA >60 08/15/2020     Hepatic Lab Results  Component Value Date   AST 22 08/15/2020   ALT 33 08/15/2020   ALBUMIN 3.2 (L) 08/15/2020   ALKPHOS 41 08/15/2020   LIPASE 38 08/11/2020     Electrolytes Lab Results  Component Value Date   NA 137 08/15/2020   K 3.8 08/15/2020   CL 109 08/15/2020   CALCIUM 8.4 (L) 08/15/2020   MG 1.7 08/14/2020   PHOS  3.3 11/07/2016     Bone Lab Results  Component Value Date   25OHVITD1 25 (L) 11/02/2017   25OHVITD2 <1.0 11/02/2017   25OHVITD3 25 11/02/2017     Inflammation (CRP: Acute Phase) (ESR: Chronic Phase) Lab Results  Component Value Date   CRP 1.7 (H) 08/15/2020   ESRSEDRATE 12 11/02/2017       Note: Above Lab results reviewed.  Imaging  CT CHEST W CONTRAST CLINICAL DATA:  Follow-up lung nodule, history of prior COVID-19  EXAM: CT CHEST WITH CONTRAST  TECHNIQUE: Multidetector CT imaging of the chest was performed during intravenous contrast administration.  CONTRAST:  33m OMNIPAQUE IOHEXOL 300 MG/ML  SOLN  COMPARISON:  CT from 08/14/2020  FINDINGS: Cardiovascular: Thoracic aorta and its branches demonstrate atherosclerotic calcification without aneurysmal dilatation or dissection. No cardiac enlargement is noted. Pulmonary artery as visualized is within normal limits although not timed for embolus evaluation.  Mediastinum/Nodes: Thoracic inlet is within normal limits. No sizable hilar or mediastinal adenopathy is noted. The esophagus as visualized is within normal limits.  Lungs/Pleura: Lungs are well aerated bilaterally and again demonstrate a right upper lobe nodule along the major fissure which measures approximately 14 mm in mean diameter. It causes some traction upon the adjacent fissure and has a mildly spiculated border. This appears slightly larger than that seen on the prior exam but the previous study was incomplete with regards to coverage of this nodule. No other nodules are seen. No effusion is noted.  Upper Abdomen: Cholelithiasis is noted. Fatty  infiltration of the liver is seen. No other focal abnormality is noted.  Musculoskeletal: Degenerative changes of the thoracic spine are noted. No acute bony abnormality is seen.  IMPRESSION: 14 mm mean diameter nodule in the right upper lobe along the major fissure. It has findings suspicious for  neoplasm. It also appears slightly more prominent than that noted on the prior exam although it was previously incompletely evaluated. Further evaluation by means of PET-CT and or tissue sampling is recommended.  Cholelithiasis and fatty infiltration of the liver.  No other focal abnormality is noted.  Aortic Atherosclerosis (ICD10-I70.0).  These results will be called to the ordering clinician or representative by the Radiologist Assistant, and communication documented in the PACS or [employer].  Electronically Signed   By: Inez Catalina M.D.   On: 10/18/2020 20:45  Assessment  The primary encounter diagnosis was Chronic low back pain (1ry area of Pain) (Bilateral) w/o sciatica. 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  Plan of Care  Problem-specific:  No problem-specific Assessment & Plan notes found for this encounter.  Kara [name] has a current medication list which includes the following long-term medication(s): calcium carbonate, candesartan, glipizide, hydrocodone-acetaminophen, hydrocodone-acetaminophen, hydrocodone-acetaminophen, ropinirole, tramadol, and warfarin.  Pharmacotherapy (Medications Ordered): No orders of the defined types were placed in this encounter.  Orders:  No orders of the defined types were placed in this encounter.  Follow-up plan:   Return for scheduled encounter.      Interventional Therapies  Risk  Complexity  Complicating Factors:   Estimated body mass index is 36.49 kg/m as calculated from the following:   Height as of this encounter: '5\' 3"'  (1.6 m).   Weight as of this encounter: 206 lb (93.4  kg). 1.  Coumadin anticoagulation (Stop:5days  Re-start: 2hrs) 2.  Latex allergy  3.  Morbid obesity (class III) (BMI>40)  4.  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   Under consideration:   Palliative procedures until BMI<35.   Completed:   Palliative/Therapeutic left L4-5 LESI x1(11/10/2017) Palliative left lumbar facet blockx14(06/24/2020) Palliativeright lumbar facet block(s) x19(08/05/2020) Palliative right SI joint block x8(not as effective as facet Blk) (03/08/2019) Diagnostic left SI joint block x1 (not as effective as facet Blk) (03/08/2019) Palliative/Therapeutic (Midline) caudalESIx3(06/08/2018)   Palliative options:   Palliative/Therapeutic left L4-5 LESI #2 Palliative left lumbar facet block#15 Palliativeright lumbar facet block(s) #19 Palliative right SI joint block #9 Diagnostic left SI joint block #2  Palliative/Therapeutic (Midline) caudalESI#4    Recent Visits Date Type Provider Dept  09/30/20 Procedure visit Milinda Pointer, MD [name]  08/19/20 Telemedicine Milinda Pointer, MD [name]  08/05/20 Procedure visit Milinda Pointer, MD [name]  08/04/20 Office Visit Milinda Pointer, MD [name]  Showing recent visits within past 90 days and meeting all other requirements Today's Visits Date Type Provider Dept  10/21/20 Telemedicine Milinda Pointer, MD [name]  Showing today's visits and meeting all other requirements Future Appointments Date Type Provider Dept  10/22/20 Appointment Milinda Pointer, MD [name]  Showing future appointments within next 90 days and meeting  all other requirements  I discussed the assessment and treatment plan with the patient. The patient was provided an opportunity to ask questions and all were answered. The  patient agreed with the plan and demonstrated an understanding of the instructions.  Patient advised to call back or seek an in-person evaluation if the symptoms or condition worsens.  Duration of encounter: 30 minutes.  Note by: Gaspar Cola, MD Date: 10/21/2020; Time: 6:53 PM

## 2020-10-21 ENCOUNTER — Ambulatory Visit: Payer: PPO | Attending: Pain Medicine | Admitting: Pain Medicine

## 2020-10-21 ENCOUNTER — Other Ambulatory Visit: Payer: Self-pay

## 2020-10-21 DIAGNOSIS — M47817 Spondylosis without myelopathy or radiculopathy, lumbosacral region: Secondary | ICD-10-CM

## 2020-10-21 DIAGNOSIS — Z86711 Personal history of pulmonary embolism: Secondary | ICD-10-CM | POA: Diagnosis not present

## 2020-10-21 DIAGNOSIS — M4306 Spondylolysis, lumbar region: Secondary | ICD-10-CM | POA: Diagnosis not present

## 2020-10-21 DIAGNOSIS — G8929 Other chronic pain: Secondary | ICD-10-CM | POA: Diagnosis not present

## 2020-10-21 DIAGNOSIS — M4310 Spondylolisthesis, site unspecified: Secondary | ICD-10-CM

## 2020-10-21 DIAGNOSIS — R791 Abnormal coagulation profile: Secondary | ICD-10-CM | POA: Diagnosis not present

## 2020-10-21 DIAGNOSIS — M47816 Spondylosis without myelopathy or radiculopathy, lumbar region: Secondary | ICD-10-CM | POA: Diagnosis not present

## 2020-10-21 DIAGNOSIS — I10 Essential (primary) hypertension: Secondary | ICD-10-CM | POA: Diagnosis not present

## 2020-10-21 DIAGNOSIS — M5136 Other intervertebral disc degeneration, lumbar region: Secondary | ICD-10-CM | POA: Diagnosis not present

## 2020-10-21 DIAGNOSIS — Z6838 Body mass index (BMI) 38.0-38.9, adult: Secondary | ICD-10-CM | POA: Diagnosis not present

## 2020-10-21 DIAGNOSIS — M545 Low back pain, unspecified: Secondary | ICD-10-CM | POA: Diagnosis not present

## 2020-10-21 DIAGNOSIS — Z7901 Long term (current) use of anticoagulants: Secondary | ICD-10-CM | POA: Diagnosis not present

## 2020-10-21 DIAGNOSIS — E1142 Type 2 diabetes mellitus with diabetic polyneuropathy: Secondary | ICD-10-CM | POA: Diagnosis not present

## 2020-10-21 DIAGNOSIS — E1165 Type 2 diabetes mellitus with hyperglycemia: Secondary | ICD-10-CM | POA: Diagnosis not present

## 2020-10-21 DIAGNOSIS — M4300 Spondylolysis, site unspecified: Secondary | ICD-10-CM

## 2020-10-21 NOTE — Patient Instructions (Signed)
______________________________________________________________________________________________  Body mass index (BMI)  Body mass index (BMI) is a common tool for deciding whether a person has an appropriate body weight.  It measures a persons weight in relation to their height.   According to the [employer] of health (NIH): Marland Kitchen A BMI of less than 18.5 means that a person is underweight. . A BMI of between 18.5 and 24.9 is ideal. . A BMI of between 25 and 29.9 is overweight. . A BMI over 30 indicates obesity.  Weight Management Required  URGENT: Your weight has been found to be adversely affecting your health.  Dear Kara [name]:  Your current Estimated body mass index is 36.49 kg/m as calculated from the following:   Height as of 09/30/20: '5\' 3"'  (1.6 m).   Weight as of 09/30/20: 206 lb (93.4 kg).  Please use the table below to identify your weight category and associated incidence of chronic pain, secondary to your weight.  Body Mass Index (BMI) Classification BMI level (kg/m2) Category Associated incidence of chronic pain  <18  Underweight   18.5-24.9 Ideal body weight   25-29.9 Overweight  20%  30-34.9 Obese (Class I)  68%  35-39.9 Severe obesity (Class II)  136%  >40 Extreme obesity (Class III)  254%   In addition: You will be considered "Morbidly Obese", if your BMI is above 30 and you have one or more of the following conditions which are known to be caused and/or directly associated with obesity: 1.    Type 2 Diabetes (Which in turn can lead to cardiovascular diseases (CVD), stroke, peripheral vascular diseases (PVD), retinopathy, nephropathy, and neuropathy) 2.    Cardiovascular Disease (High Blood Pressure; Congestive Heart Failure; High Cholesterol; Coronary Artery Disease; Angina; or History of Heart Attacks) 3.    Breathing problems (Asthma; obesity-hypoventilation syndrome; obstructive sleep apnea; chronic inflammatory airway disease; reactive airway  disease; or shortness of breath) 4.    Chronic kidney disease 5.    Liver disease (nonalcoholic fatty liver disease) 6.    High blood pressure 7.    Acid reflux (gastroesophageal reflux disease; heartburn) 8.    Osteoarthritis (OA) (with any of the following: hip pain; knee pain; and/or low back pain) 9.    Low back pain (Lumbar Facet Syndrome; and/or Degenerative Disc Disease) 10.  Hip pain (Osteoarthritis of hip) (For every 1 lbs of added body weight, there is a 2 lbs increase in pressure inside of each hip articulation. 1:2 mechanical relationship) 11.  Knee pain (Osteoarthritis of knee) (For every 1 lbs of added body weight, there is a 4 lbs increase in pressure inside of each knee articulation. 1:4 mechanical relationship) (patients with a BMI>30 kg/m2 were 6.8 times more likely to develop knee OA than normal-weight individuals) 12.  Cancer: Epidemiological studies have shown that obesity is a risk factor for: post-menopausal breast cancer; cancers of the endometrium, colon and kidney cancer; malignant adenomas of the oesophagus. Obese subjects have an approximately 1.5-3.5-fold increased risk of developing these cancers compared with normal-weight subjects, and it has been estimated that between 15 and 45% of these cancers can be attributed to overweight. More recent studies suggest that obesity may also increase the risk of other types of cancer, including pancreatic, hepatic and gallbladder cancer. (Ref: Obesity and cancer. Pischon T, Nthlings U, [employer] H. Proc Nutr Soc. 2008 May;67(2):128-45. doi: 63.8937/D4287681157262035.) The International Agency for Research on Cancer (IARC) has identified 13 cancers associated with overweight and obesity: meningioma, multiple myeloma, adenocarcinoma of the esophagus,  and cancers of the thyroid, postmenopausal breast cancer, gallbladder, stomach, liver, pancreas, kidney, ovaries, uterus, colon and rectal (colorectal) cancers. 42 percent of all cancers  diagnosed in women and 24 percent of those diagnosed in men are associated with overweight and obesity.  Recommendation: At this point it is urgent that you take a step back and concentrate in loosing weight. Dedicate 100% of your efforts on this task. Nothing else will improve your health more than bringing your weight down and your BMI to less than 30. If you are here, you probably have chronic pain. We know that most chronic pain patients have difficulty exercising secondary to their pain. For this reason, you must rely on proper nutrition and diet in order to lose the weight. If your BMI is above 40, you should seriously consider bariatric surgery. A realistic goal is to lose 10% of your body weight over a period of 12 months.  Be honest to yourself, if over time you have unsuccessfully tried to lose weight, then it is time for you to seek professional help and to enter a medically supervised weight management program, and/or undergo bariatric surgery. Stop procrastinating.   Pain management considerations:  1.    Pharmacological Problems: Be advised that the use of opioid analgesics (oxycodone; hydrocodone; morphine; methadone; codeine; and all of their derivatives) have been associated with decreased metabolism and weight gain.  For this reason, should we see that you are unable to lose weight while taking these medications, it may become necessary for [place] to taper down and indefinitely discontinue them.  2.    Technical Problems: The incidence of successful interventional therapies decreases as the patient's BMI increases. It is much more difficult to accomplish a safe and effective interventional therapy on a patient with a BMI above 35. 3.    Radiation Exposure Problems: The x-rays machine, used to accomplish injection therapies, will automatically increase their x-ray output in order to capture an appropriate bone image. This means that radiation exposure increases exponentially with the patient's  BMI. (The higher the BMI, the higher the radiation exposure.) Although the level of radiation used at a given time is still safe to the patient, it is not for the physician and/or assisting staff. Unfortunately, radiation exposure is accumulative. Because physicians and the staff have to do procedures and be exposed on a daily basis, this can result in health problems such as cancer and radiation burns. Radiation exposure to the staff is monitored by the radiation batches that they wear. The exposure levels are reported back to the staff on a quarterly basis. Depending on levels of exposure, physicians and staff may be obligated by law to decrease this exposure. This means that they have the right and obligation to refuse providing therapies where they may be overexposed to radiation. For this reason, physicians may decline to offer therapies such as radiofrequency ablation or implants to patients with a BMI above 40. 4.    Current Trends: Be advised that the current trend is to no longer offer certain therapies to patients with a BMI equal to, or above 35, due to increase perioperative risks, increased technical procedural difficulties, and excessive radiation exposure to healthcare personnel.  ______________________________________________________________________________________________

## 2020-10-22 ENCOUNTER — Other Ambulatory Visit: Payer: Self-pay | Admitting: *Deleted

## 2020-10-22 ENCOUNTER — Encounter: Payer: PPO | Admitting: Pain Medicine

## 2020-10-22 DIAGNOSIS — R911 Solitary pulmonary nodule: Secondary | ICD-10-CM

## 2020-10-22 DIAGNOSIS — Z79891 Long term (current) use of opiate analgesic: Secondary | ICD-10-CM | POA: Insufficient documentation

## 2020-10-22 DIAGNOSIS — R8290 Unspecified abnormal findings in urine: Secondary | ICD-10-CM | POA: Diagnosis not present

## 2020-10-22 NOTE — Progress Notes (Deleted)
The patient indicated yesterday that she did not feel she needed any refills for now.

## 2020-10-27 DIAGNOSIS — M1611 Unilateral primary osteoarthritis, right hip: Secondary | ICD-10-CM | POA: Diagnosis not present

## 2020-10-27 DIAGNOSIS — I10 Essential (primary) hypertension: Secondary | ICD-10-CM | POA: Diagnosis not present

## 2020-10-27 DIAGNOSIS — M5136 Other intervertebral disc degeneration, lumbar region: Secondary | ICD-10-CM | POA: Diagnosis not present

## 2020-10-27 DIAGNOSIS — Z0000 Encounter for general adult medical examination without abnormal findings: Secondary | ICD-10-CM | POA: Diagnosis not present

## 2020-10-27 DIAGNOSIS — Z6836 Body mass index (BMI) 36.0-36.9, adult: Secondary | ICD-10-CM | POA: Diagnosis not present

## 2020-10-27 DIAGNOSIS — R911 Solitary pulmonary nodule: Secondary | ICD-10-CM | POA: Diagnosis not present

## 2020-10-27 DIAGNOSIS — Z7901 Long term (current) use of anticoagulants: Secondary | ICD-10-CM | POA: Diagnosis not present

## 2020-10-27 DIAGNOSIS — E1165 Type 2 diabetes mellitus with hyperglycemia: Secondary | ICD-10-CM | POA: Diagnosis not present

## 2020-10-27 DIAGNOSIS — E1142 Type 2 diabetes mellitus with diabetic polyneuropathy: Secondary | ICD-10-CM | POA: Diagnosis not present

## 2020-10-27 DIAGNOSIS — Z86711 Personal history of pulmonary embolism: Secondary | ICD-10-CM | POA: Diagnosis not present

## 2020-10-28 ENCOUNTER — Encounter
Admission: RE | Admit: 2020-10-28 | Discharge: 2020-10-28 | Disposition: A | Discharge status: Home / Self Care | Payer: PPO | Source: Ambulatory Visit | Attending: Oncology | Admitting: Oncology

## 2020-10-28 ENCOUNTER — Other Ambulatory Visit: Payer: Self-pay

## 2020-10-28 DIAGNOSIS — R911 Solitary pulmonary nodule: Secondary | ICD-10-CM | POA: Insufficient documentation

## 2020-10-28 MED ORDER — FLUDEOXYGLUCOSE F - 18 (FDG) INJECTION: 10.7000 | Freq: Once | INTRAVENOUS | Status: DC | PRN

## 2020-10-30 ENCOUNTER — Telehealth: Payer: Self-pay | Admitting: Pain Medicine

## 2020-10-31 ENCOUNTER — Telehealth: Payer: Self-pay | Admitting: Pain Medicine

## 2020-10-31 NOTE — Telephone Encounter (Signed)
Will ask Dr Dossie Arbour on Monday for order.

## 2020-11-01 ENCOUNTER — Other Ambulatory Visit: Payer: Self-pay | Admitting: Pain Medicine

## 2020-11-01 DIAGNOSIS — G894 Chronic pain syndrome: Secondary | ICD-10-CM

## 2020-11-03 ENCOUNTER — Inpatient Hospital Stay: Payer: PPO | Admitting: Oncology

## 2020-11-03 ENCOUNTER — Inpatient Hospital Stay: Payer: PPO

## 2020-11-04 ENCOUNTER — Other Ambulatory Visit: Payer: Self-pay

## 2020-11-04 ENCOUNTER — Ambulatory Visit
Admission: RE | Admit: 2020-11-04 | Discharge: 2020-11-04 | Disposition: A | Discharge status: Home / Self Care | Payer: PPO | Source: Ambulatory Visit | Attending: Oncology | Admitting: Oncology

## 2020-11-04 DIAGNOSIS — R911 Solitary pulmonary nodule: Secondary | ICD-10-CM | POA: Diagnosis not present

## 2020-11-04 DIAGNOSIS — K439 Ventral hernia without obstruction or gangrene: Secondary | ICD-10-CM | POA: Insufficient documentation

## 2020-11-04 DIAGNOSIS — K8020 Calculus of gallbladder without cholecystitis without obstruction: Secondary | ICD-10-CM | POA: Diagnosis not present

## 2020-11-04 LAB — GLUCOSE, CAPILLARY: Glucose-Capillary: 87 mg/dL (ref 70–99)

## 2020-11-04 MED ORDER — FLUDEOXYGLUCOSE F - 18 (FDG) INJECTION
10.7000 | Freq: Once | INTRAVENOUS | Status: AC | PRN
  Administered 2020-11-04: 10.7 via INTRAVENOUS

## 2020-11-06 ENCOUNTER — Telehealth: Payer: Self-pay | Admitting: Pain Medicine

## 2020-11-06 NOTE — Telephone Encounter (Signed)
Patient lvmail stating she is out of medications. She needs appt. There are no available appts until Aug. Please advise patient what to do about medications. She missed last scheduled med refill appt.

## 2020-11-07 ENCOUNTER — Encounter: Payer: PPO | Admitting: Pain Medicine

## 2020-11-07 NOTE — Telephone Encounter (Signed)
What should we do about this?  She missed her appointment for med refill, and there are no appts until August?  Should we put her on for a virtual visit?  But her will only write for 1 month.  Please advise.

## 2020-11-07 NOTE — Telephone Encounter (Signed)
Kara [name] to schedule her for a virtual visit asap and to put her on for a F2F July 13th at either 1220 or 1520.

## 2020-11-10 ENCOUNTER — Other Ambulatory Visit: Payer: Self-pay

## 2020-11-10 ENCOUNTER — Encounter: Payer: Self-pay | Admitting: Oncology

## 2020-11-10 ENCOUNTER — Inpatient Hospital Stay: Payer: PPO | Attending: Oncology | Admitting: Oncology

## 2020-11-10 ENCOUNTER — Inpatient Hospital Stay: Payer: PPO

## 2020-11-10 ENCOUNTER — Encounter: Payer: Self-pay | Admitting: *Deleted

## 2020-11-10 VITALS — BP 138/72 | HR 92 | Temp 98.0°F | Resp 20 | Wt 210.1 lb

## 2020-11-10 DIAGNOSIS — I700 Atherosclerosis of aorta: Secondary | ICD-10-CM | POA: Diagnosis not present

## 2020-11-10 DIAGNOSIS — Z86711 Personal history of pulmonary embolism: Secondary | ICD-10-CM | POA: Diagnosis not present

## 2020-11-10 DIAGNOSIS — K760 Fatty (change of) liver, not elsewhere classified: Secondary | ICD-10-CM

## 2020-11-10 DIAGNOSIS — R911 Solitary pulmonary nodule: Secondary | ICD-10-CM | POA: Diagnosis not present

## 2020-11-11 NOTE — Progress Notes (Signed)
PROVIDER NOTE: Information contained herein reflects review and annotations entered in association with encounter. Interpretation of such information and data should be left to medically-trained personnel. Information provided to patient can be located elsewhere in the medical record under "Patient Instructions". Document created using STT-dictation technology, any transcriptional errors that may result from process are unintentional.    Patient: Kara [name]  Service Category: E/M  Provider: Gaspar Cola, MD  DOB: Jul 10, 1950  DOS: 11/12/2020  Specialty: Interventional Pain Management  MRN: [id number]  Setting: Ambulatory outpatient  PCP: Tracie Harrier, MD  Type: Established Patient    Referring Provider: Tracie Harrier, MD  Location: Office  Delivery: Face-to-face     HPI  [name], a 70 y.o. year old female, is here today because of her Chronic pain syndrome [G89.4]. [name] primary complain today is Back Pain (Lumbar bilateral ) Last encounter: My last encounter with her was on 11/06/2020. Pertinent problems: 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); Lumbar pars defect (L3 and L4) (Bilateral); Diabetic peripheral neuropathy ([place]); Chronic lower extremity pain (2ry area of Pain) (Bilateral); Chronic radicular pain of lower extremity; Osteoarthritis of hip (Right); Chronic low back pain (1ry area of Pain) (Bilateral) w/o  sciatica; Spondylosis without myelopathy or radiculopathy, lumbosacral region; Other specified dorsopathies, sacral and sacrococcygeal region; Secondary osteoarthritis of multiple sites; Sacral insufficiency fracture; DDD (degenerative disc disease), lumbosacral; Coccygodynia; Chronic sacroiliac joint pain (Left); Osteoarthritis of lumbar spine; Spondylolisthesis, lumbosacral region; Abnormal MRI, lumbar spine (08/09/2019); and Spasm of muscle of lower back on their pertinent problem list. Pain Assessment: Severity of Chronic pain is reported as a 10-Worst pain ever/10. Location: Back Lower, Left, Right/back pain into buttocks and down the leg bilaterally. Onset: More than a month ago. Quality: Discomfort, Constant, Dull, Stabbing. Timing: Constant. Modifying factor(s): laying down, medications. Vitals:  height is '5\' 3"'  (1.6 m) and weight is 206 lb (93.4 kg). Her temporal temperature is 97.3 F (36.3 C) (abnormal). Her blood pressure is 124/65 and her pulse is 90. Her respiration is 16 and oxygen saturation is 96%.   Reason for encounter: medication management.   The patient indicates doing well with the current medication regimen. No adverse reactions or side effects reported to the medications.  UDS updated today.  I have reminded the patient that we will try to avoid having procedures done any sooner than every 60 days.  This means that the next time that she will be eligible for a palliative bilateral lumbar facet block would be one 11/29/2020.  After a long conversation with the patient, I was able to have her give me a specific number as to the number of hydrocodone pills that she is consuming in a 30-day period.  She describes using approximately 15 tablets/month.  RTCB: 02/10/2021 Nonopioids transferred 03/27/2020: Calcium  Pharmacotherapy Assessment  Analgesic: Hydrocodone/APAP 5/325, 1 tab PO QD PRN (5 mg/day of hydrocodone) (5 MME) + tramadol 50 mg, 1 tab p.o. every 8 hours (150 mg/day of  tramadol) (15 MME) MME: 20 mg/day.   Monitoring: [place] PMP: PDMP reviewed during  this encounter.       Pharmacotherapy: No side-effects or adverse reactions reported. Compliance: No problems identified. Effectiveness: Clinically acceptable.  Janett Billow, RN  11/12/2020 10:53 AM  Sign when Signing Visit Nursing Pain Medication Assessment:  Safety precautions to be maintained throughout the outpatient stay will include: orient to surroundings, keep bed in low position, maintain call bell within reach at all times, provide assistance with transfer out of bed and ambulation.  Medication Inspection Compliance: Pill count conducted under aseptic conditions, in front of the patient. Neither the pills nor the bottle was removed from the patient's sight at any time. Once count was completed pills were immediately returned to the patient in their original bottle.  Medication #1: Hydrocodone/APAP Pill/Patch Count:  2 of 30 pills remain Pill/Patch Appearance: Markings consistent with prescribed medication Bottle Appearance: Standard pharmacy container. Clearly labeled. Filled Date: 05 / 09 / 2022 Last Medication intake:  Yesterday  Medication #2: Tramadol (Ultram) Pill/Patch Count:  35 of 90 pills remain Pill/Patch Appearance: Markings consistent with prescribed medication Bottle Appearance: Standard pharmacy container. Clearly labeled. Filled Date: 05  / 09 /  2022 Last Medication intake:  Yesterday    UDS:  Summary  Date Value Ref Range Status  09/20/2019 Note  Final    Comment:    ==================================================================== [employer] Select [facility] (MW) ==================================================================== Test                             Result       Flag       Units Drug Absent but Declared for Prescription Verification   Hydrocodone                    Not Detected UNEXPECTED ng/mg  creat ==================================================================== Test                      Result    Flag   Units      Ref Range   Creatinine              126              mg/dL      >=20 ==================================================================== Declared Medications:  The flagging and interpretation on this report are based on the  following declared medications.  Unexpected results may arise from  inaccuracies in the declared medications.  **Note: The testing scope of this panel includes these medications:  Hydrocodone  **Note: The testing scope of this panel does not include the  following reported medications:  Acetaminophen  Aspirin  Calcium  Candesartan  Cholecalciferol  Cyanocobalamin  Cyclobenzaprine  Dulaglutide  Gabapentin  Glipizide  Loperamide  Meclizine  Multivitamin  Nystatin  Ropinirole  Warfarin ==================================================================== For clinical consultation, please call [phone]. ====================================================================      ROS  Constitutional: Denies any fever or chills Gastrointestinal: No reported hemesis, hematochezia, vomiting, or acute GI distress Musculoskeletal: Denies any acute onset joint swelling, redness, loss of ROM, or weakness Neurological: No reported episodes of acute onset apraxia, aphasia, dysarthria, agnosia, amnesia, paralysis, loss of coordination, or loss of consciousness  Medication Review  Dulaglutide, HYDROcodone-acetaminophen, Multi-Vitamins, alendronate, aspirin EC, calcium carbonate, candesartan, cholecalciferol, cyclobenzaprine, gabapentin, glipiZIDE, meclizine, nystatin cream, ondansetron, rOPINIRole, tolterodine, traMADol, vitamin B-12, and warfarin  History Review  Allergy: Ms. Venie Montesinos is allergic to adhesive [tape], other, latex, and morphine and related. Drug: Ms. Khamiya Varin  reports no history of drug use.  Alcohol:   reports no history of alcohol use. Tobacco:  reports that she has never smoked. She has never used smokeless tobacco. Social: Ms. Nadege Carriger  reports that she has never smoked. She has never used smokeless tobacco. She reports that she does not drink alcohol and does not use drugs. 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 Surgical: Ms. Kamalani Mastro  has a past surgical history that includes Knee surgery; Carpal tunnel release (Bilateral); Colon resection sigmoid (N/A, 11/02/2016); Colostomy (N/A, 11/02/2016); Sigmoidoscopy (N/A, 11/13/2016); PULMONARY VENOGRAPHY (N/A, 11/19/2016); Colonoscopy with propofol (N/A, 02/03/2017); Colon surgery (11/02/2016); IVC FILTER INSERTION (Right, 10/2016); Colostomy reversal (N/A, 02/24/2017); Appendectomy (N/A, 02/24/2017); Lysis of adhesion (N/A, 02/24/2017); laparoscopy (N/A, 02/24/2017); Ileo loop diversion (N/A, 02/24/2017); Sigmoidoscopy (N/A, 05/19/2017); Ileostomy closure (N/A, 06/01/2017); IVC FILTER REMOVAL (N/A, 08/09/2017); Sacroplasty (N/A, 11/08/2017); Cataract extraction w/PHACO (Left, 11/21/2018); and Cataract extraction w/PHACO (Right, 12/12/2018). Family: family history includes Breast cancer in her mother; Cancer in her mother; Diabetes in her mother and sister; Heart disease in her father, mother, and sister.  Laboratory Chemistry Profile   Renal Lab Results  Component Value Date   BUN 17 08/15/2020   CREATININE 0.70 10/17/2020   GFRAA >60 07/09/2019   GFRNONAA >60 08/15/2020     Hepatic Lab Results  Component Value Date   AST 22 08/15/2020   ALT 33 08/15/2020   ALBUMIN 3.2 (L) 08/15/2020   ALKPHOS 41 08/15/2020   LIPASE 38 08/11/2020     Electrolytes Lab Results   Component Value Date   NA 137 08/15/2020   K 3.8 08/15/2020   CL 109 08/15/2020   CALCIUM 8.4 (L) 08/15/2020   MG 1.7 08/14/2020   PHOS 3.3 11/07/2016     Bone Lab Results  Component Value Date   25OHVITD1 25 (L) 11/02/2017   25OHVITD2 <1.0 11/02/2017   25OHVITD3 25 11/02/2017     Inflammation (CRP: Acute Phase) (ESR: Chronic Phase) Lab Results  Component Value Date   CRP 1.7 (H) 08/15/2020   ESRSEDRATE 12 11/02/2017       Note: Above Lab results reviewed.  Recent Imaging Review  NM PET Image Initial (PI) Skull Base To Thigh CLINICAL DATA:  Initial treatment strategy for right upper lobe pulmonary nodule.  EXAM: NUCLEAR MEDICINE PET SKULL BASE TO THIGH  TECHNIQUE: 10.7 mCi F-18 FDG was injected intravenously. Full-ring PET imaging was performed from the skull base to thigh after the radiotracer. CT data was obtained and used for attenuation correction and anatomic localization.  Fasting blood glucose: 87 mg/dl  COMPARISON:  Chest CT 10/17/2020  FINDINGS: Mediastinal blood pool activity: SUV max 2.10  Liver activity: SUV max NA  NECK: No hypermetabolic lymph nodes in the neck.  Incidental CT findings: none  CHEST: The 14 mm right upper lobe pulmonary nodule demonstrates FDG uptake with SUV max of 3.33. Findings suspicious for small primary pulmonary neoplasm.  No enlarged or hypermetabolic mediastinal or hilar lymph nodes to suggest metastatic adenopathy.  No other pulmonary lesions are identified.  No breast masses, supraclavicular or axillary adenopathy.  Incidental CT findings: Scattered aortic calcifications but no aneurysm. Minimal scattered coronary artery calcifications.  ABDOMEN/PELVIS: No findings suspicious for abdominal/pelvic metastatic disease.  Incidental CT findings: Cholelithiasis. Moderate-sized anterior abdominal wall hernia containing a small bowel loop and part of the  transverse colon but no obstructive findings. Scattered  aortic calcifications.  SKELETON: No findings suspicious for osseous metastatic disease.  Incidental CT findings: Advanced lower lumbar facet disease. Evidence of prior sacral plasties.  IMPRESSION: 1. Solitary 14 mm right upper lobe pulmonary nodule adjacent to the major fissure is hypermetabolic and consistent with a small primary pulmonary neoplasm. 2. No enlarged or hypermetabolic mediastinal or hilar lymphadenopathy. 3. No findings for distant metastatic disease.  Electronically Signed   By: Marijo Sanes M.D.   On: 11/05/2020 09:36 Note: Reviewed        Physical Exam  General appearance: Well nourished, well developed, and well hydrated. In no apparent acute distress Mental status: Alert, oriented x 3 (person, place, & time)       Respiratory: No evidence of acute respiratory distress Eyes: PERLA Vitals: BP 124/65 (BP Location: Right Arm, Patient Position: Sitting, Cuff Size: Large)   Pulse 90   Temp (!) 97.3 F (36.3 C) (Temporal)   Resp 16   Ht '5\' 3"'  (1.6 m)   Wt 206 lb (93.4 kg)   SpO2 96%   BMI 36.49 kg/m  BMI: Estimated body mass index is 36.49 kg/m as calculated from the following:   Height as of this encounter: '5\' 3"'  (1.6 m).   Weight as of this encounter: 206 lb (93.4 kg). Ideal: Ideal body weight: 52.4 kg (115 lb 8.3 oz) Adjusted ideal body weight: 68.8 kg (151 lb 11.4 oz)  Assessment   Status Diagnosis  Controlled Controlled Controlled 1. Chronic pain syndrome   2. Chronic low back pain (1ry area of Pain) (Bilateral) w/o sciatica   3. Chronic lower extremity pain (2ry area of Pain) (Bilateral)   4. DDD (degenerative disc disease), lumbosacral   5. Grade 1-2 Anterolisthesis of L4/L5 & L5/S1   6. Lumbar facet syndrome (Bilateral)   7. Pharmacologic therapy   8. Chronic use of opiate for therapeutic purpose   9. Encounter for chronic pain management      Updated Problems: Problem  Chronic low back pain (Bilateral) w/ sciatica (Bilateral)   Acute Postoperative Pain (Resolved)    Plan of Care  Problem-specific:  No problem-specific Assessment & Plan notes found for this encounter.  Ms. Niamya Vittitow has a current medication list which includes the following long-term medication(s): calcium carbonate, candesartan, glipizide, glipizide, ropinirole, warfarin, hydrocodone-acetaminophen, [START ON 12/12/2020] hydrocodone-acetaminophen, [START ON 01/11/2021] hydrocodone-acetaminophen, and [START ON 11/17/2020] tramadol.  Pharmacotherapy (Medications Ordered): Meds ordered this encounter  Medications   traMADol (ULTRAM) 50 MG tablet    Sig: Take 1 tablet (50 mg total) by mouth every 8 (eight) hours as needed for severe pain. Each refill must last 30 days.    Dispense:  90 tablet    Refill:  2    Not a duplicate. Do NOT delete! Dispense 1 day early if closed on refill date. Avoid benzodiazepines within 8 hours of opioids. Do not send refill requests.   HYDROcodone-acetaminophen (NORCO/VICODIN) 5-325 MG tablet    Sig: Take 1 tablet by mouth at bedtime as needed for moderate pain. Must last 30 days.    Dispense:  15 tablet    Refill:  0    Not a duplicate. Do NOT delete! Dispense 1 day early if closed on refill date. Avoid benzodiazepines within 8 hours of opioids. Do not send refill requests.   HYDROcodone-acetaminophen (NORCO/VICODIN) 5-325 MG tablet    Sig: Take 1 tablet by mouth at bedtime as needed for moderate pain. Must last  30 days.    Dispense:  15 tablet    Refill:  0    Not a duplicate. Do NOT delete! Dispense 1 day early if closed on refill date. Avoid benzodiazepines within 8 hours of opioids. Do not send refill requests.   HYDROcodone-acetaminophen (NORCO/VICODIN) 5-325 MG tablet    Sig: Take 1 tablet by mouth at bedtime as needed for moderate pain. Must last 30 days.    Dispense:  15 tablet    Refill:  0    Not a duplicate. Do NOT delete! Dispense 1 day early if closed on refill date. Avoid benzodiazepines  within 8 hours of opioids. Do not send refill requests.    Orders:  Orders Placed This Encounter  Procedures   LUMBAR FACET(MEDIAL BRANCH NERVE BLOCK) MBNB    Standing Status:   Future    Standing Expiration Date:   02/12/2021    Scheduling Instructions:     Procedure: Lumbar facet block (AKA.: Lumbosacral medial branch nerve block)     Side: Bilateral     Level: L3-4, L4-5, & L5-S1 Facets (L2, L3, L4, L5, & S1 Medial Branch Nerves)     Sedation: Patient's choice.     Timeframe: ASAA    Order Specific Question:   Where will this procedure be performed?    Answer:   [name]   [employer] Select 13 (MW), Urine    Volume: 30 ml(s). Minimum 3 ml of urine is needed. Document temperature of fresh sample. Indications: Long term (current) use of opiate analgesic [phone])    Order Specific Question:   Release to patient    Answer:   Immediate   Nursing Instructions:    Please call this patient's pharmacy and cancel any older opioid analgesic scripts that may be left from prior visits.  Tell the pharmacist to only keep those sent today.    Follow-up plan:   Return in about 3 months (around 02/10/2021) for evaluation day (F2F) (MM).     Interventional Therapies  Risk  Complexity  Complicating Factors:   Estimated body mass index is 36.49 kg/m as calculated from the following:   Height as of this encounter: '5\' 3"'  (1.6 m).   Weight as of this encounter: 206 lb (93.4 kg). 1.  Coumadin anticoagulation  (Stop: 5 days  Re-start: 2hrs) 2.  Latex allergy  3.  Morbid obesity (class III) (BMI>40)  4.  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   Under consideration:   Palliative procedures until BMI<35.   Completed:   Palliative/Therapeutic left L4-5 LESI x1 (11/10/2017) Palliative left lumbar facet block x14 (06/24/2020) Palliative right lumbar facet block(s) x19  (08/05/2020) Palliative right SI joint block x8 (not as effective as facet Blk) (03/08/2019) Diagnostic left SI joint block x1 (not as effective as facet Blk) (03/08/2019) Palliative/Therapeutic (Midline) caudal ESI x3 (06/08/2018)   Palliative options:   Palliative/Therapeutic left L4-5 LESI #2   Palliative left lumbar facet block #15  Palliative right lumbar facet block(s) #19  Palliative right SI joint block #9  Diagnostic left SI joint block #2  Palliative/Therapeutic (Midline) caudal ESI #4      Recent Visits Date Type Provider Dept  10/21/20 Telemedicine Milinda Pointer, MD [name]  09/30/20 Procedure visit Milinda Pointer, MD [name]  08/19/20 Telemedicine Milinda Pointer, MD [name]  Showing recent visits within  past 90 days and meeting all other requirements Today's Visits Date Type Provider Dept  11/12/20 Office Visit Milinda Pointer, MD [name]  Showing today's visits and meeting all other requirements Future Appointments No visits were found meeting these conditions. Showing future appointments within next 90 days and meeting all other requirements I discussed the assessment and treatment plan with the patient. The patient was provided an opportunity to ask questions and all were answered. The patient agreed with the plan and demonstrated an understanding of the instructions.  Patient advised to call back or seek an in-person evaluation if the symptoms or condition worsens.  Duration of encounter: 30 minutes.  Note by: Gaspar Cola, MD Date: 11/12/2020; Time: 11:29 AM

## 2020-11-12 ENCOUNTER — Ambulatory Visit: Payer: PPO | Attending: Pain Medicine | Admitting: Pain Medicine

## 2020-11-12 ENCOUNTER — Other Ambulatory Visit: Payer: Self-pay | Admitting: Pain Medicine

## 2020-11-12 ENCOUNTER — Encounter: Payer: Self-pay | Admitting: Pain Medicine

## 2020-11-12 ENCOUNTER — Other Ambulatory Visit: Payer: Self-pay

## 2020-11-12 VITALS — BP 124/65 | HR 90 | Temp 97.3°F | Resp 16 | Ht 63.0 in | Wt 206.0 lb

## 2020-11-12 DIAGNOSIS — M79605 Pain in left leg: Secondary | ICD-10-CM | POA: Diagnosis not present

## 2020-11-12 DIAGNOSIS — M79604 Pain in right leg: Secondary | ICD-10-CM

## 2020-11-12 DIAGNOSIS — M47816 Spondylosis without myelopathy or radiculopathy, lumbar region: Secondary | ICD-10-CM | POA: Insufficient documentation

## 2020-11-12 DIAGNOSIS — M5137 Other intervertebral disc degeneration, lumbosacral region: Secondary | ICD-10-CM

## 2020-11-12 DIAGNOSIS — G8929 Other chronic pain: Secondary | ICD-10-CM

## 2020-11-12 DIAGNOSIS — G894 Chronic pain syndrome: Secondary | ICD-10-CM | POA: Diagnosis not present

## 2020-11-12 DIAGNOSIS — M545 Low back pain, unspecified: Secondary | ICD-10-CM

## 2020-11-12 DIAGNOSIS — M4310 Spondylolisthesis, site unspecified: Secondary | ICD-10-CM | POA: Diagnosis not present

## 2020-11-12 DIAGNOSIS — Z79899 Other long term (current) drug therapy: Secondary | ICD-10-CM | POA: Diagnosis not present

## 2020-11-12 DIAGNOSIS — Z79891 Long term (current) use of opiate analgesic: Secondary | ICD-10-CM | POA: Insufficient documentation

## 2020-11-12 MED ORDER — HYDROCODONE-ACETAMINOPHEN 5-325 MG PO TABS: 1.0000 | ORAL_TABLET | Freq: Every evening | ORAL | 0 refills | Status: DC | PRN

## 2020-11-12 MED ORDER — TRAMADOL HCL 50 MG PO TABS: 50.0000 mg | ORAL_TABLET | Freq: Three times a day (TID) | ORAL | 2 refills | Status: DC | PRN

## 2020-11-12 NOTE — Patient Instructions (Signed)
____________________________________________________________________________________________  Medication Rules  Purpose: To inform patients, and their family members, of our rules and regulations.  Applies to: All patients receiving prescriptions (written or electronic).  Pharmacy of record: Pharmacy where electronic prescriptions will be sent. If written prescriptions are taken to a different pharmacy, please inform the nursing staff. The pharmacy listed in the electronic medical record should be the one where you would like electronic prescriptions to be sent.  Electronic prescriptions: In compliance with the [place] Strengthen Opioid Misuse Prevention (STOP) Act of 2017 (Session Law 2017-74/H243), effective May 24, 2018, all controlled substances must be electronically prescribed. Calling prescriptions to the pharmacy will cease to exist.  Prescription refills: Only during scheduled appointments. Applies to all prescriptions.  NOTE: The following applies primarily to controlled substances (Opioid* Pain Medications).   Type of encounter (visit): For patients receiving controlled substances, face-to-face visits are required. (Not an option or up to the patient.)  Patient's responsibilities: Pain Pills: Bring all pain pills to every appointment (except for procedure appointments). Pill Bottles: Bring pills in original pharmacy bottle. Always bring the newest bottle. Bring bottle, even if empty. Medication refills: You are responsible for knowing and keeping track of what medications you take and those you need refilled. The day before your appointment: write a list of all prescriptions that need to be refilled. The day of the appointment: give the list to the admitting nurse. Prescriptions will be written only during appointments. No prescriptions will be written on procedure days. If you forget a medication: it will not be "Called in", "Faxed", or "electronically sent". You will  need to get another appointment to get these prescribed. No early refills. Do not call asking to have your prescription filled early. Prescription Accuracy: You are responsible for carefully inspecting your prescriptions before leaving our office. Have the discharge nurse carefully go over each prescription with you, before taking them home. Make sure that your name is accurately spelled, that your address is correct. Check the name and dose of your medication to make sure it is accurate. Check the number of pills, and the written instructions to make sure they are clear and accurate. Make sure that you are given enough medication to last until your next medication refill appointment. Taking Medication: Take medication as prescribed. When it comes to controlled substances, taking less pills or less frequently than prescribed is permitted and encouraged. Never take more pills than instructed. Never take medication more frequently than prescribed.  Inform other Doctors: Always inform, all of your healthcare providers, of all the medications you take. Pain Medication from other Providers: You are not allowed to accept any additional pain medication from any other Doctor or Healthcare provider. There are two exceptions to this rule. (see below) In the event that you require additional pain medication, you are responsible for notifying us, as stated below. Cough Medicine: Often these contain an opioid, such as codeine or hydrocodone. Never accept or take cough medicine containing these opioids if you are already taking an opioid* medication. The combination may cause respiratory failure and death. Medication Agreement: You are responsible for carefully reading and following our Medication Agreement. This must be signed before receiving any prescriptions from our practice. Safely store a copy of your signed Agreement. Violations to the Agreement will result in no further prescriptions. (Additional copies of our  Medication Agreement are available upon request.) Laws, Rules, & Regulations: All patients are expected to follow all Federal and State Laws, Statutes, Rules, & Regulations. Ignorance of   the Laws does not constitute a valid excuse.  Illegal drugs and Controlled Substances: The use of illegal substances (including, but not limited to marijuana and its derivatives) and/or the illegal use of any controlled substances is strictly prohibited. Violation of this rule may result in the immediate and permanent discontinuation of any and all prescriptions being written by our practice. The use of any illegal substances is prohibited. Adopted CDC guidelines & recommendations: Target dosing levels will be at or below 60 MME/day. Use of benzodiazepines** is not recommended.  Exceptions: There are only two exceptions to the rule of not receiving pain medications from other Healthcare Providers. Exception #1 (Emergencies): In the event of an emergency (i.e.: accident requiring emergency care), you are allowed to receive additional pain medication. However, you are responsible for: As soon as you are able, call our office [phone], at any time of the day or night, and leave a message stating your name, the date and nature of the emergency, and the name and dose of the medication prescribed. In the event that your call is answered by a member of our staff, make sure to document and save the date, time, and the name of the person that took your information.  Exception #2 (Planned Surgery): In the event that you are scheduled by another doctor or dentist to have any type of surgery or procedure, you are allowed (for a period no longer than 30 days), to receive additional pain medication, for the acute post-op pain. However, in this case, you are responsible for picking up a copy of our "Post-op Pain Management for Surgeons" handout, and giving it to your surgeon or dentist. This document is available at our office, and  does not require an appointment to obtain it. Simply go to our office during business hours (Monday-Thursday from 8:00 AM to 4:00 PM) (Friday 8:00 AM to 12:00 Noon) or if you have a scheduled appointment with us, prior to your surgery, and ask for it by name. In addition, you are responsible for: calling our office [phone], at any time of the day or night, and leaving a message stating your name, name of your surgeon, type of surgery, and date of procedure or surgery. Failure to comply with your responsibilities may result in termination of therapy involving the controlled substances.  *Opioid medications include: morphine, codeine, oxycodone, oxymorphone, hydrocodone, hydromorphone, meperidine, tramadol, tapentadol, buprenorphine, fentanyl, methadone. **Benzodiazepine medications include: diazepam (Valium), alprazolam (Xanax), clonazepam (Klonopine), lorazepam (Ativan), clorazepate (Tranxene), chlordiazepoxide (Librium), estazolam (Prosom), oxazepam (Serax), temazepam (Restoril), triazolam (Halcion) (Last updated: 04/21/2020) ____________________________________________________________________________________________  ____________________________________________________________________________________________  Medication Recommendations and Reminders  Applies to: All patients receiving prescriptions (written and/or electronic).  Medication Rules & Regulations: These rules and regulations exist for your safety and that of others. They are not flexible and neither are we. Dismissing or ignoring them will be considered "non-compliance" with medication therapy, resulting in complete and irreversible termination of such therapy. (See document titled "Medication Rules" for more details.) In all conscience, because of safety reasons, we cannot continue providing a therapy where the patient does not follow instructions.  Pharmacy of record:  Definition: This is the pharmacy where your electronic  prescriptions will be sent.  We do not endorse any particular pharmacy, however, we have experienced problems with [facility] not securing enough medication supply for the community. We do not restrict you in your choice of pharmacy. However, once we write for your prescriptions, we will NOT be re-sending more prescriptions to fix restricted supply problems   created by your pharmacy, or your insurance.  The pharmacy listed in the electronic medical record should be the one where you want electronic prescriptions to be sent. If you choose to change pharmacy, simply notify our nursing staff.  Recommendations: Keep all of your pain medications in a safe place, under lock and key, even if you live alone. We will NOT replace lost, stolen, or damaged medication. After you fill your prescription, take 1 week's worth of pills and put them away in a safe place. You should keep a separate, properly labeled bottle for this purpose. The remainder should be kept in the original bottle. Use this as your primary supply, until it runs out. Once it's gone, then you know that you have 1 week's worth of medicine, and it is time to come in for a prescription refill. If you do this correctly, it is unlikely that you will ever run out of medicine. To make sure that the above recommendation works, it is very important that you make sure your medication refill appointments are scheduled at least 1 week before you run out of medicine. To do this in an effective manner, make sure that you do not leave the office without scheduling your next medication management appointment. Always ask the nursing staff to show you in your prescription , when your medication will be running out. Then arrange for the receptionist to get you a return appointment, at least 7 days before you run out of medicine. Do not wait until you have 1 or 2 pills left, to come in. This is very poor planning and does not take into consideration that we may need to  cancel appointments due to bad weather, sickness, or emergencies affecting our staff. DO NOT ACCEPT A "Partial Fill": If for any reason your pharmacy does not have enough pills/tablets to completely fill or refill your prescription, do not allow for a "partial fill". The law allows the pharmacy to complete that prescription within 72 hours, without requiring a new prescription. If they do not fill the rest of your prescription within those 72 hours, you will need a separate prescription to fill the remaining amount, which we will NOT provide. If the reason for the partial fill is your insurance, you will need to talk to the pharmacist about payment alternatives for the remaining tablets, but again, DO NOT ACCEPT A PARTIAL FILL, unless you can trust your pharmacist to obtain the remainder of the pills within 72 hours.  Prescription refills and/or changes in medication(s):  Prescription refills, and/or changes in dose or medication, will be conducted only during scheduled medication management appointments. (Applies to both, written and electronic prescriptions.) No refills on procedure days. No medication will be changed or started on procedure days. No changes, adjustments, and/or refills will be conducted on a procedure day. Doing so will interfere with the diagnostic portion of the procedure. No phone refills. No medications will be "called into the pharmacy". No Fax refills. No weekend refills. No Holliday refills. No after hours refills.  Remember:  Business hours are:  Monday to Thursday 8:00 AM to 4:00 PM Provider's Schedule: Ruhan Borak, MD - Appointments are:  Medication management: Monday and Wednesday 8:00 AM to 4:00 PM Procedure day: Tuesday and Thursday 7:30 AM to 4:00 PM Bilal Lateef, MD - Appointments are:  Medication management: Tuesday and Thursday 8:00 AM to 4:00 PM Procedure day: Monday and Wednesday 7:30 AM to 4:00 PM (Last update:  12/12/2019) ____________________________________________________________________________________________  ____________________________________________________________________________________________  CBD (cannabidiol) WARNING    Applicable to: All individuals currently taking or considering taking CBD (cannabidiol) and, more important, all patients taking opioid analgesic controlled substances (pain medication). (Example: oxycodone; oxymorphone; hydrocodone; hydromorphone; morphine; methadone; tramadol; tapentadol; fentanyl; buprenorphine; butorphanol; dextromethorphan; meperidine; codeine; etc.)  Legal status: CBD remains a Schedule I drug prohibited for any use. CBD is illegal with one exception. In the [place], CBD has a limited Food and Drug Administration (FDA) approval for the treatment of two specific types of epilepsy disorders. Only one CBD product has been approved by the FDA for this purpose: "Epidiolex". FDA is aware that some companies are marketing products containing cannabis and cannabis-derived compounds in ways that violate the Federal Food, Drug and Cosmetic Act (FD&C Act) and that may put the health and safety of consumers at risk. The FDA, a Federal agency, has not enforced the CBD status since 2018.   Legality: Some manufacturers ship CBD products nationally, which is illegal. Often such products are sold online and are therefore available throughout the country. CBD is openly sold in head shops and health food stores in some states where such sales have not been explicitly legalized. Selling unapproved products with unsubstantiated therapeutic claims is not only a violation of the law, but also can put patients at risk, as these products have not been proven to be safe or effective. Federal illegality makes it difficult to conduct research on CBD.  Reference: "FDA Regulation of Cannabis and Cannabis-Derived Products, Including Cannabidiol (CBD)" -  https://www.fda.gov/news-events/public-health-focus/fda-regulation-cannabis-and-cannabis-derived-products-including-cannabidiol-cbd  Warning: CBD is not FDA approved and has not undergo the same manufacturing controls as prescription drugs.  This means that the purity and safety of available CBD may be questionable. Most of the time, despite manufacturer's claims, it is contaminated with THC (delta-9-tetrahydrocannabinol - the chemical in marijuana responsible for the "HIGH").  When this is the case, the THC contaminant will trigger a positive urine drug screen (UDS) test for Marijuana (carboxy-THC). Because a positive UDS for any illicit substance is a violation of our medication agreement, your opioid analgesics (pain medicine) may be permanently discontinued.  MORE ABOUT CBD  General Information: CBD  is a derivative of the Marijuana (cannabis sativa) plant discovered in 1940. It is one of the 113 identified substances found in Marijuana. It accounts for up to 40% of the plant's extract. As of 2018, preliminary clinical studies on CBD included research for the treatment of anxiety, movement disorders, and pain. CBD is available and consumed in multiple forms, including inhalation of smoke or vapor, as an aerosol spray, and by mouth. It may be supplied as an oil containing CBD, capsules, dried cannabis, or as a liquid solution. CBD is thought not to be as psychoactive as THC (delta-9-tetrahydrocannabinol - the chemical in marijuana responsible for the "HIGH"). Studies suggest that CBD may interact with different biological target receptors in the body, including cannabinoid and other neurotransmitter receptors. As of 2018 the mechanism of action for its biological effects has not been determined.  Side-effects  Adverse reactions: Dry mouth, diarrhea, decreased appetite, fatigue, drowsiness, malaise, weakness, sleep disturbances, and others.  Drug interactions: CBC may interact with other medications  such as blood-thinners. (Last update: 12/29/2019) ____________________________________________________________________________________________  ____________________________________________________________________________________________  Drug Holidays (Slow)  What is a "Drug Holiday"? Drug Holiday: is the name given to the period of time during which a patient stops taking a medication(s) for the purpose of eliminating tolerance to the drug.  Benefits Improved effectiveness of opioids. Decreased opioid dose needed to achieve benefits. Improved pain with lesser dose.    What is tolerance? Tolerance: is the progressive decreased in effectiveness of a drug due to its repetitive use. With repetitive use, the body gets use to the medication and as a consequence, it loses its effectiveness. This is a common problem seen with opioid pain medications. As a result, a larger dose of the drug is needed to achieve the same effect that used to be obtained with a smaller dose.  How long should a "Drug Holiday" last? You should stay off of the pain medicine for at least 14 consecutive days. (2 weeks)  Should I stop the medicine "cold turkey"? No. You should always coordinate with your Pain Specialist so that he/she can provide you with the correct medication dose to make the transition as smoothly as possible.  How do I stop the medicine? Slowly. You will be instructed to decrease the daily amount of pills that you take by one (1) pill every seven (7) days. This is called a "slow downward taper" of your dose. For example: if you normally take four (4) pills per day, you will be asked to drop this dose to three (3) pills per day for seven (7) days, then to two (2) pills per day for seven (7) days, then to one (1) per day for seven (7) days, and at the end of those last seven (7) days, this is when the "Drug Holiday" would start.   Will I have withdrawals? By doing a "slow downward taper" like this one, it  is unlikely that you will experience any significant withdrawal symptoms. Typically, what triggers withdrawals is the sudden stop of a high dose opioid therapy. Withdrawals can usually be avoided by slowly decreasing the dose over a prolonged period of time. If you do not follow these instructions and decide to stop your medication abruptly, withdrawals may be possible.  What are withdrawals? Withdrawals: refers to the wide range of symptoms that occur after stopping or dramatically reducing opiate drugs after heavy and prolonged use. Withdrawal symptoms do not occur to patients that use low dose opioids, or those who take the medication sporadically. Contrary to benzodiazepine (example: Valium, Xanax, etc.) or alcohol withdrawals ("Delirium Tremens"), opioid withdrawals are not lethal. Withdrawals are the physical manifestation of the body getting rid of the excess receptors.  Expected Symptoms Early symptoms of withdrawal may include: Agitation Anxiety Muscle aches Increased tearing Insomnia Runny nose Sweating Yawning  Late symptoms of withdrawal may include: Abdominal cramping Diarrhea Dilated pupils Goose bumps Nausea Vomiting  Will I experience withdrawals? Due to the slow nature of the taper, it is very unlikely that you will experience any.  What is a slow taper? Taper: refers to the gradual decrease in dose.  (Last update: 12/12/2019) ____________________________________________________________________________________________    

## 2020-11-12 NOTE — Progress Notes (Signed)
Nursing Pain Medication Assessment:  Safety precautions to be maintained throughout the outpatient stay will include: orient to surroundings, keep bed in low position, maintain call bell within reach at all times, provide assistance with transfer out of bed and ambulation.  Medication Inspection Compliance: Pill count conducted under aseptic conditions, in front of the patient. Neither the pills nor the bottle was removed from the patient's sight at any time. Once count was completed pills were immediately returned to the patient in their original bottle.  Medication #1: Hydrocodone/APAP Pill/Patch Count:  2 of 30 pills remain Pill/Patch Appearance: Markings consistent with prescribed medication Bottle Appearance: Standard pharmacy container. Clearly labeled. Filled Date: 05 / 09 / 2022 Last Medication intake:  Yesterday  Medication #2: Tramadol (Ultram) Pill/Patch Count:  35 of 90 pills remain Pill/Patch Appearance: Markings consistent with prescribed medication Bottle Appearance: Standard pharmacy container. Clearly labeled. Filled Date: 05  / 09 /  2022 Last Medication intake:  Yesterday

## 2020-11-15 NOTE — Progress Notes (Signed)
Hematology/Oncology Consult note [facility] [phone] [phone]  Patient Care Team: Tracie Harrier, MD as PCP - General (Internal Medicine) Telford Nab, RN as Oncology Nurse Navigator   Name of the patient: Kara [name]  [id number]  02/12/1951    Reason for referral-lung nodule   Referring physician-Dr. Ginette Pitman  Date of visit: 11/15/20   History of presenting illness- Patient is a 70 year old female underwent CT chest with contrast in May 2022 to follow-up on lung nodule that was incidentally noted on CT abdomen in March 2022 when she was admitted for [place].  At that time she was noted to have a right lower lobe 1.3 x 1.1 cm right lower lobe nodule.  CT chest in May 2022 showed right upper lobe nodule measuring 1.4 cm which was also hypermetabolic on PET scan with an SUV of 3.3.  No other enlarged mediastinal or hilar lymph nodes or distant metastatic disease was noted.  Patient referred for further management.  She reports baseline fatigue but denies other complaints.  She has been a never smoker but was exposed to passive smoking to her husband and son.  Appetite and weight have been stable.  Denies any new aches and pains anywhere  ECOG PS- 1  Pain scale- 0   Review of systems- Review of Systems  Constitutional:  Negative for chills, fever, malaise/fatigue and weight loss.  HENT:  Negative for congestion, ear discharge and nosebleeds.   Eyes:  Negative for blurred vision.  Respiratory:  Negative for cough, hemoptysis, sputum production, shortness of breath and wheezing.   Cardiovascular:  Negative for chest pain, palpitations, orthopnea and claudication.  Gastrointestinal:  Negative for abdominal pain, blood in stool, constipation, diarrhea, heartburn, melena, nausea and vomiting.  Genitourinary:  Negative for dysuria, flank pain, frequency, hematuria and urgency.  Musculoskeletal:  Negative for back pain, joint pain  and myalgias.  Skin:  Negative for rash.  Neurological:  Negative for dizziness, tingling, focal weakness, seizures, weakness and headaches.  Endo/Heme/Allergies:  Does not bruise/bleed easily.  Psychiatric/Behavioral:  Negative for depression and suicidal ideas. The patient does not have insomnia.    Allergies  Allergen Reactions   Adhesive [Tape] Other (See Comments)    Pt reports, when removing tape from skin most tapes pull her skin off. Ok to use paper tape   Other Hives    Chlorhexadine wipes/CHG wipes,    Latex Rash    Exam gloves/ does not react around elastic and lips don't swell when blowing balloons   Morphine And Related Nausea And Vomiting    Patient Active Problem List   Diagnosis Date Noted   Chronic use of opiate for therapeutic purpose 10/22/2020   Gastroenteritis due to COVID-19 virus 53/97/6734   Metabolic syndrome 19/37/9024   Generalized weakness 08/14/2020   Truncal obesity 08/04/2020   Latex precautions, history of latex allergy 06/24/2020   Hyperglycemia 09/73/5329   Uncomplicated opioid dependence ([place]) 06/15/2020   Spasm of muscle of lower back 01/29/2020   Abnormal MRI, lumbar spine (08/09/2019) 08/14/2019   Spondylolisthesis, lumbosacral region 07/26/2019   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   Chronic sacroiliac joint pain (Left) 02/28/2019   Preoperative testing 10/23/2018   Small bowel obstruction due to adhesions ([place]) 08/24/2018   Coccygodynia 05/09/2018   Acute deep vein thrombosis (DVT) of distal end of right lower extremity ([place]) 05/08/2018   DDD (degenerative disc disease), lumbosacral 04/11/2018   Chronic anticoagulation (COUMADIN) 04/11/2018   Secondary osteoarthritis of multiple sites 03/15/2018  Sacral insufficiency fracture 03/15/2018   Chronic low back pain (1ry area of Pain) (Bilateral) w/o sciatica 12/14/2017   Spondylosis without myelopathy or radiculopathy, lumbosacral region 12/14/2017   Other specified dorsopathies,  sacral and sacrococcygeal region 12/14/2017   History of allergy to latex 11/09/2017   Hyponatremia 11/02/2017   Hypocalcemia 11/02/2017   Grade 1-2 Anterolisthesis of L4/L5 & L5/S1 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) 11/02/2017   Lumbar pars defect (L3 and L4) (Bilateral) 11/02/2017   Diabetic peripheral neuropathy ([place]) 11/02/2017   Chronic lower extremity pain (2ry area of Pain) (Bilateral) 11/02/2017   Chronic radicular pain of lower extremity 11/02/2017   Osteoarthritis of hip (Right) 11/02/2017   Chronic low back pain (Bilateral) w/ sciatica (Bilateral) 10/13/2017   Chronic sacroiliac joint pain (Right) 10/13/2017   Chronic pain syndrome 10/13/2017   Long term current use of opiate analgesic 10/13/2017   Pharmacologic therapy 10/13/2017   Disorder of skeletal system 10/13/2017   Problems influencing health status 10/13/2017   Diverticulosis of colon 06/20/2017   History of pulmonary embolism 06/20/2017   S/P IVC filter 06/20/2017   H/O ileostomy 06/01/2017   Positive result for methicillin resistant Staphylococcus aureus (MRSA) screening 05/11/2017   Ileostomy in place [facility]) 04/08/2017   History of colostomy reversal 02/24/2017   Other pulmonary embolism without acute cor pulmonale ([place]) 11/23/2016   Acute on chronic systolic (congestive) heart failure ([place]) 11/18/2016   HCAP (healthcare-associated pneumonia) 11/18/2016   Encounter for removal of staples    Edema of both legs 11/15/2016   Severe obesity (BMI 35.0-39.9) with comorbidity ([place]) 11/15/2016   Diverticulitis 10/03/2016   Diverticulitis large intestine 09/29/2016   Benign essential hypertension 08/26/2016   DDD  (degenerative disc disease), lumbar 07/14/2016   Acute non-recurrent maxillary sinusitis 05/21/2016   Chronic hip pain (Left) 02/23/2016   Cough 07/30/2015   Nocturnal leg cramps 02/20/2015   Diabetes mellitus type 2 in obese ([place]) 11/12/2014   Type 2 diabetes mellitus with hyperglycemia, without long-term current use of insulin ([place]) 11/12/2014   BP (high blood pressure) 11/12/2014     Past Medical History:  Diagnosis Date   Anginal pain ([place])    Arthritis    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, 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   CHF (congestive heart failure) ([place])    followed colon resection, "wouldn't let me get up"   Diabetes ([place])    type 2   Diverticulitis    History of being hospitalized    1 month ago for 3 days, vomiting, and kink in upper intestine   History of hiatal hernia    ?   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[place])    Vertigo      Past Surgical History:  Procedure Laterality Date   APPENDECTOMY N/A 02/24/2017   Procedure: Incidental  APPENDECTOMY;  Surgeon: [name], MD;  Location: [name];  Service: General;  Laterality: N/A;   CARPAL TUNNEL RELEASE Bilateral    CATARACT EXTRACTION W/PHACO Left 11/21/2018   Procedure: CATARACT EXTRACTION PHACO AND INTRAOCULAR LENS PLACEMENT ([place]) LEFT DIABETES;  Surgeon: Birder Robson, MD;  Location: [place];  Service: Ophthalmology;  Laterality: Left;  latex sensitivity Diabetic - oral meds   CATARACT EXTRACTION W/PHACO Right 12/12/2018   Procedure: CATARACT EXTRACTION PHACO AND INTRAOCULAR LENS PLACEMENT ([place]) RIGHT DIABETES;  Surgeon: Birder Robson, MD;  Location: [place];  Service: Ophthalmology;  Laterality: Right;  Diabetes-oral med Latex sensitiviy   COLON RESECTION SIGMOID N/A 11/02/2016   Procedure: COLON RESECTION SIGMOID;  Surgeon: [name], MD;  Location: [name];  Service: General;  Laterality: N/A;    COLON SURGERY  11/02/2016   COLONOSCOPY WITH PROPOFOL N/A 02/03/2017   Procedure: COLONOSCOPY WITH PROPOFOL;  Surgeon: Lin Landsman, MD;  Location: [facility];  Service: Gastroenterology;  Laterality: N/A;   COLOSTOMY N/A 11/02/2016   Procedure: COLOSTOMY;  Surgeon: [name], MD;  Location: [name];  Service: General;  Laterality: N/A;   COLOSTOMY REVERSAL N/A 02/24/2017   Procedure: COLOSTOMY REVERSAL, ostomy takedown, spleenic flexure mobilization, excision rectal stump/distal sigmoid, anastomosis with suture reinforcement;  Surgeon: [name], MD;  Location: [name];  Service: General;  Laterality: N/A;   ILEO LOOP DIVERSION N/A 02/24/2017   Procedure: ILEO LOOP COLOSTOMY;  Surgeon: [name], MD;  Location: [name];  Service: General;  Laterality: N/A;   ILEOSTOMY CLOSURE N/A 06/01/2017   Procedure: LOOP ILEOSTOMY TAKEDOWN;  Surgeon: [name], MD;  Location: [name];  Service: General;  Laterality: N/A;   IVC FILTER INSERTION Right 10/2016   IVC FILTER REMOVAL N/A 08/09/2017   Procedure: IVC FILTER REMOVAL;  Surgeon: Katha Cabal, MD;  Location: [place] [facility];  Service: Cardiovascular;  Laterality: N/A;   KNEE SURGERY     torn menicus   LAPAROSCOPY N/A 02/24/2017   Procedure: LAPAROSCOPY DIAGNOSTIC;  Surgeon: [name], MD;  Location: [name];  Service: General;  Laterality: N/A;   LYSIS OF ADHESION N/A 02/24/2017   Procedure: LYSIS OF ADHESION;  Surgeon: [name], MD;  Location: [name];  Service: General;  Laterality: N/A;   PULMONARY VENOGRAPHY N/A 11/19/2016   Procedure: Pulmonary Venography; IVC filter placement; possible pulmonary thrombectomy;  Surgeon: Katha Cabal, MD;  Location: [place] [facility];  Service: Cardiovascular;  Laterality: N/A;   SACROPLASTY N/A 11/08/2017   Procedure: SACROPLASTY S2;  Surgeon: Hessie Knows, MD;  Location: [name];  Service: Orthopedics;  Laterality: N/A;   SIGMOIDOSCOPY N/A  11/13/2016   Procedure: endoscopic  flexible SIGMOIDOSCOPY;  Surgeon: Florene Glen, MD;  Location: [name];  Service: General;  Laterality: N/A;   SIGMOIDOSCOPY N/A 05/19/2017   Procedure: Beryle Quant;  Surgeon: [name], MD;  Location: [name];  Service: General;  Laterality: N/A;    Social History   Socioeconomic History   Marital status: Married    Spouse name: Not on file   Number of children: Not on file   Years of education: Not on file   Highest education level: Not on file  Occupational History   Not on file  Tobacco Use   Smoking status: Never   Smokeless tobacco: Never  Vaping Use   Vaping Use: Never used  Substance and Sexual Activity   Alcohol use: No    Alcohol/week: 0.0 standard drinks   Drug use: No   Sexual activity: Never  Other Topics Concern   Not on file  Social History Narrative   Not on file   Social Determinants of Health   Financial Resource Strain: Not on file  Food Insecurity: Not on file  Transportation Needs: Not on file  Physical Activity: Not on file  Stress: Not  on file  Social Connections: Not on file  Intimate Partner Violence: Not on file     Family History  Problem Relation Age of Onset   Diabetes Mother    Heart disease Mother    Cancer Mother    Breast cancer Mother        >50   Heart disease Father    Diabetes Sister    Heart disease Sister      Current Outpatient Medications:    alendronate (FOSAMAX) 70 MG tablet, Take 70 mg by mouth once a week. Take with a full glass of water on an empty stomach., Disp: , Rfl:    aspirin EC 81 MG tablet, Take 81 mg by mouth daily. am, Disp: , Rfl:    calcium carbonate (CALCIUM 600) 600 MG TABS tablet, Take 1 tablet (600 mg total) by mouth 2 (two) times daily with a meal., Disp: 60 tablet, Rfl: 5   candesartan (ATACAND) 16 MG tablet, Take 16 mg by mouth daily. am, Disp: , Rfl:    cholecalciferol (VITAMIN D) 25 MCG (1000 UNIT) tablet, Take 1,000 Units by mouth  daily., Disp: , Rfl:    cyclobenzaprine (FLEXERIL) 10 MG tablet, Take 10 mg by mouth at bedtime., Disp: , Rfl:    Dulaglutide 1.5 MG/0.5ML SOPN, Inject 1.5 mg into the skin once a week., Disp: , Rfl:    gabapentin (NEURONTIN) 100 MG capsule, Take 100 mg by mouth 2 (two) times daily., Disp: , Rfl:    gabapentin (NEURONTIN) 300 MG capsule, Take 300 mg by mouth at bedtime., Disp: , Rfl:    glipiZIDE (GLUCOTROL XL) 5 MG 24 hr tablet, Take 15 mg by mouth daily with breakfast. , Disp: , Rfl:    glipiZIDE (GLUCOTROL) 10 MG tablet, Take 10 mg by mouth daily before breakfast., Disp: , Rfl:    meclizine (ANTIVERT) 25 MG tablet, Take 25 mg by mouth daily. am, Disp: , Rfl:    Multiple Vitamin (MULTI-VITAMINS) TABS, Take 1 tablet by mouth daily., Disp: , Rfl:    nystatin cream (MYCOSTATIN), Apply 1 application topically 3 (three) times daily., Disp: , Rfl:    ondansetron (ZOFRAN ODT) 4 MG disintegrating tablet, Take 1 tablet (4 mg total) by mouth every 8 (eight) hours as needed for nausea or vomiting., Disp: 20 tablet, Rfl: 0   rOPINIRole (REQUIP) 0.5 MG tablet, Take 0.5 mg by mouth at bedtime., Disp: , Rfl:    tolterodine (DETROL) 2 MG tablet, Take 2 mg by mouth 2 (two) times daily., Disp: , Rfl:    vitamin B-12 (CYANOCOBALAMIN) 500 MCG tablet, Take 500 mcg by mouth daily., Disp: , Rfl:    warfarin (COUMADIN) 6 MG tablet, Take 6 mg by mouth See admin instructions. Take 6 mg Monday- Friday, take 7 mg Saturdays and Sunday, Disp: , Rfl:    HYDROcodone-acetaminophen (NORCO/VICODIN) 5-325 MG tablet, Take 1 tablet by mouth at bedtime as needed for moderate pain. Must last 30 days., Disp: 15 tablet, Rfl: 0   [START ON 12/12/2020] HYDROcodone-acetaminophen (NORCO/VICODIN) 5-325 MG tablet, Take 1 tablet by mouth at bedtime as needed for moderate pain. Must last 30 days., Disp: 15 tablet, Rfl: 0   [START ON 01/11/2021] HYDROcodone-acetaminophen (NORCO/VICODIN) 5-325 MG tablet, Take 1 tablet by mouth at bedtime as needed  for moderate pain. Must last 30 days., Disp: 15 tablet, Rfl: 0   [START ON 11/17/2020] traMADol (ULTRAM) 50 MG tablet, Take 1 tablet (50 mg total) by mouth every 8 (eight) hours as needed for  severe pain. Each refill must last 30 days., Disp: 90 tablet, Rfl: 2   Physical exam:  Vitals:   11/10/20 1515  BP: 138/72  Pulse: 92  Resp: 20  Temp: 98 F (36.7 C)  TempSrc: Tympanic  SpO2: 97%  Weight: 210 lb 1.6 oz (95.3 kg)   Physical Exam Cardiovascular:     Rate and Rhythm: Normal rate and regular rhythm.     Heart sounds: Normal heart sounds.  Pulmonary:     Effort: Pulmonary effort is normal.     Breath sounds: Normal breath sounds.  Abdominal:     General: Bowel sounds are normal.     Palpations: Abdomen is soft.  Skin:    General: Skin is warm and dry.  Neurological:     Mental Status: She is alert and oriented to person, place, and time.       CMP Latest Ref Rng & Units 10/17/2020  Glucose 70 - 99 mg/dL -  BUN 8 - 23 mg/dL -  Creatinine 0.44 - 1.00 mg/dL 0.70  Sodium 135 - 145 mmol/L -  Potassium 3.5 - 5.1 mmol/L -  Chloride 98 - 111 mmol/L -  CO2 22 - 32 mmol/L -  Calcium 8.9 - 10.3 mg/dL -  Total Protein 6.5 - 8.1 g/dL -  Total Bilirubin 0.3 - 1.2 mg/dL -  Alkaline Phos 38 - 126 U/L -  AST 15 - 41 U/L -  ALT 0 - 44 U/L -   CBC Latest Ref Rng & Units 08/15/2020  WBC 4.0 - 10.5 K/uL 5.7  Hemoglobin 12.0 - 15.0 g/dL 13.2  Hematocrit 36.0 - 46.0 % 39.4  Platelets 150 - 400 K/uL 209    No images are attached to the encounter.  CT CHEST W CONTRAST  Result Date: 10/18/2020 CLINICAL DATA:  Follow-up lung nodule, history of prior COVID-19 EXAM: CT CHEST WITH CONTRAST TECHNIQUE: Multidetector CT imaging of the chest was performed during intravenous contrast administration. CONTRAST:  73mL OMNIPAQUE IOHEXOL 300 MG/ML  SOLN COMPARISON:  CT from 08/14/2020 FINDINGS: Cardiovascular: Thoracic aorta and its branches demonstrate atherosclerotic calcification without  aneurysmal dilatation or dissection. No cardiac enlargement is noted. Pulmonary artery as visualized is within normal limits although not timed for embolus evaluation. Mediastinum/Nodes: Thoracic inlet is within normal limits. No sizable hilar or mediastinal adenopathy is noted. The esophagus as visualized is within normal limits. Lungs/Pleura: Lungs are well aerated bilaterally and again demonstrate a right upper lobe nodule along the major fissure which measures approximately 14 mm in mean diameter. It causes some traction upon the adjacent fissure and has a mildly spiculated border. This appears slightly larger than that seen on the prior exam but the previous study was incomplete with regards to coverage of this nodule. No other nodules are seen. No effusion is noted. Upper Abdomen: Cholelithiasis is noted. Fatty infiltration of the liver is seen. No other focal abnormality is noted. Musculoskeletal: Degenerative changes of the thoracic spine are noted. No acute bony abnormality is seen. IMPRESSION: 14 mm mean diameter nodule in the right upper lobe along the major fissure. It has findings suspicious for neoplasm. It also appears slightly more prominent than that noted on the prior exam although it was previously incompletely evaluated. Further evaluation by means of PET-CT and or tissue sampling is recommended. Cholelithiasis and fatty infiltration of the liver. No other focal abnormality is noted. Aortic Atherosclerosis (ICD10-I70.0). These results will be called to the ordering clinician or representative by the Radiologist Assistant, and communication  documented in the PACS or [employer]. Electronically Signed   By: Inez Catalina M.D.   On: 10/18/2020 20:45   NM PET Image Initial (PI) Skull Base To Thigh  Result Date: 11/05/2020 CLINICAL DATA:  Initial treatment strategy for right upper lobe pulmonary nodule. EXAM: NUCLEAR MEDICINE PET SKULL BASE TO THIGH TECHNIQUE: 10.7 mCi F-18 FDG was injected  intravenously. Full-ring PET imaging was performed from the skull base to thigh after the radiotracer. CT data was obtained and used for attenuation correction and anatomic localization. Fasting blood glucose: 87 mg/dl COMPARISON:  Chest CT 10/17/2020 FINDINGS: Mediastinal blood pool activity: SUV max 2.10 Liver activity: SUV max NA NECK: No hypermetabolic lymph nodes in the neck. Incidental CT findings: none CHEST: The 14 mm right upper lobe pulmonary nodule demonstrates FDG uptake with SUV max of 3.33. Findings suspicious for small primary pulmonary neoplasm. No enlarged or hypermetabolic mediastinal or hilar lymph nodes to suggest metastatic adenopathy. No other pulmonary lesions are identified. No breast masses, supraclavicular or axillary adenopathy. Incidental CT findings: Scattered aortic calcifications but no aneurysm. Minimal scattered coronary artery calcifications. ABDOMEN/PELVIS: No findings suspicious for abdominal/pelvic metastatic disease. Incidental CT findings: Cholelithiasis. Moderate-sized anterior abdominal wall hernia containing a small bowel loop and part of the transverse colon but no obstructive findings. Scattered aortic calcifications. SKELETON: No findings suspicious for osseous metastatic disease. Incidental CT findings: Advanced lower lumbar facet disease. Evidence of prior sacral plasties. IMPRESSION: 1. Solitary 14 mm right upper lobe pulmonary nodule adjacent to the major fissure is hypermetabolic and consistent with a small primary pulmonary neoplasm. 2. No enlarged or hypermetabolic mediastinal or hilar lymphadenopathy. 3. No findings for distant metastatic disease. Electronically Signed   By: Marijo Sanes M.D.   On: 11/05/2020 09:36    Assessment and plan- Patient is a 70 y.o. female referred for right upper lobe lung nodule  Patient initially noted to have a right lower lobe lung nodule on CT abdomen in March 2022 which was subsequently not seen in May 2022.  However she was  noted to have a right upper lobe lung nodule 1.4 cm mildly hypermetabolic on PET scan with no evidence of distant metastatic disease.  Discussed that hypermetabolism could mean potential malignancy.  Discussed the need for tissue diagnosis either bronchoscopy or CT-guided biopsy versus getting baseline PFTs and considering direct wedge resection.  Patient that if this is lung cancer treatment for stage I lung cancer typically involves either SBRT or surgery.  Chemotherapy typically not indicated for a nodule of the size.  Patient is currently not interested in pursuing any of these options.  She would like to wait for another 3 months for repeat CT scan and then to decide which way to go.  Patient understands there is a possibility that this lung nodule cannot continue to go in the interim.   Thank you for this kind referral and the opportunity to participate in the care of this  Patient   Visit Diagnosis 1. Lung nodule     Dr. Randa Evens, MD, MPH [facility] at [facility] [phone] 11/15/2020

## 2020-11-20 LAB — TOXASSURE SELECT 13 (MW), URINE

## 2020-11-21 DIAGNOSIS — M25511 Pain in right shoulder: Secondary | ICD-10-CM | POA: Diagnosis not present

## 2020-11-21 DIAGNOSIS — R911 Solitary pulmonary nodule: Secondary | ICD-10-CM | POA: Insufficient documentation

## 2020-11-21 DIAGNOSIS — Z7901 Long term (current) use of anticoagulants: Secondary | ICD-10-CM | POA: Diagnosis not present

## 2020-11-21 DIAGNOSIS — Z933 Colostomy status: Secondary | ICD-10-CM | POA: Diagnosis not present

## 2020-11-21 DIAGNOSIS — Z9181 History of falling: Secondary | ICD-10-CM | POA: Diagnosis not present

## 2020-11-21 DIAGNOSIS — I700 Atherosclerosis of aorta: Secondary | ICD-10-CM | POA: Diagnosis not present

## 2020-11-21 DIAGNOSIS — I10 Essential (primary) hypertension: Secondary | ICD-10-CM | POA: Diagnosis not present

## 2020-11-27 DIAGNOSIS — R791 Abnormal coagulation profile: Secondary | ICD-10-CM | POA: Diagnosis not present

## 2020-12-04 ENCOUNTER — Ambulatory Visit: Payer: PPO | Admitting: Pain Medicine

## 2020-12-05 ENCOUNTER — Other Ambulatory Visit: Payer: Self-pay | Admitting: Internal Medicine

## 2020-12-05 DIAGNOSIS — Z1231 Encounter for screening mammogram for malignant neoplasm of breast: Secondary | ICD-10-CM

## 2020-12-09 ENCOUNTER — Other Ambulatory Visit: Payer: Self-pay

## 2020-12-09 ENCOUNTER — Ambulatory Visit
Admission: RE | Admit: 2020-12-09 | Discharge: 2020-12-09 | Disposition: A | Discharge status: Home / Self Care | Payer: PPO | Source: Ambulatory Visit | Attending: Pain Medicine | Admitting: Pain Medicine

## 2020-12-09 ENCOUNTER — Ambulatory Visit (HOSPITAL_BASED_OUTPATIENT_CLINIC_OR_DEPARTMENT_OTHER): Payer: PPO | Admitting: Pain Medicine

## 2020-12-09 VITALS — BP 115/83 | HR 88 | Temp 97.3°F | Resp 16 | Ht 63.0 in | Wt 206.0 lb

## 2020-12-09 DIAGNOSIS — M533 Sacrococcygeal disorders, not elsewhere classified: Secondary | ICD-10-CM

## 2020-12-09 DIAGNOSIS — M545 Low back pain, unspecified: Secondary | ICD-10-CM | POA: Diagnosis not present

## 2020-12-09 DIAGNOSIS — M47816 Spondylosis without myelopathy or radiculopathy, lumbar region: Secondary | ICD-10-CM

## 2020-12-09 DIAGNOSIS — M5388 Other specified dorsopathies, sacral and sacrococcygeal region: Secondary | ICD-10-CM

## 2020-12-09 DIAGNOSIS — Z91040 Latex allergy status: Secondary | ICD-10-CM

## 2020-12-09 DIAGNOSIS — G8918 Other acute postprocedural pain: Secondary | ICD-10-CM | POA: Insufficient documentation

## 2020-12-09 DIAGNOSIS — M8448XS Pathological fracture, other site, sequela: Secondary | ICD-10-CM | POA: Diagnosis not present

## 2020-12-09 DIAGNOSIS — G8929 Other chronic pain: Secondary | ICD-10-CM

## 2020-12-09 DIAGNOSIS — Z7901 Long term (current) use of anticoagulants: Secondary | ICD-10-CM | POA: Diagnosis not present

## 2020-12-09 DIAGNOSIS — M47817 Spondylosis without myelopathy or radiculopathy, lumbosacral region: Secondary | ICD-10-CM | POA: Diagnosis not present

## 2020-12-09 DIAGNOSIS — M5137 Other intervertebral disc degeneration, lumbosacral region: Secondary | ICD-10-CM | POA: Insufficient documentation

## 2020-12-09 DIAGNOSIS — M51379 Other intervertebral disc degeneration, lumbosacral region without mention of lumbar back pain or lower extremity pain: Secondary | ICD-10-CM

## 2020-12-09 MED ORDER — ORPHENADRINE CITRATE 30 MG/ML IJ SOLN
INTRAMUSCULAR | Status: AC
  Filled 2020-12-09: qty 2

## 2020-12-09 MED ORDER — ROPIVACAINE HCL 2 MG/ML IJ SOLN
INTRAMUSCULAR | Status: AC
  Filled 2020-12-09: qty 20

## 2020-12-09 MED ORDER — MIDAZOLAM HCL 5 MG/5ML IJ SOLN
1.0000 mg | INTRAMUSCULAR | Status: DC | PRN
  Administered 2020-12-09: 2 mg via INTRAVENOUS
  Filled 2020-12-09: qty 5

## 2020-12-09 MED ORDER — ORPHENADRINE CITRATE 30 MG/ML IJ SOLN: 60.0000 mg | Freq: Once | INTRAMUSCULAR | Status: DC

## 2020-12-09 MED ORDER — ROPIVACAINE HCL 2 MG/ML IJ SOLN
18.0000 mL | Freq: Once | INTRAMUSCULAR | Status: AC
  Administered 2020-12-09: 18 mL via PERINEURAL

## 2020-12-09 MED ORDER — KETOROLAC TROMETHAMINE 60 MG/2ML IM SOLN
INTRAMUSCULAR | Status: AC
  Filled 2020-12-09: qty 2

## 2020-12-09 MED ORDER — LIDOCAINE HCL 2 % IJ SOLN
20.0000 mL | Freq: Once | INTRAMUSCULAR | Status: AC
  Administered 2020-12-09: 400 mg

## 2020-12-09 MED ORDER — LACTATED RINGERS IV SOLN
1000.0000 mL | Freq: Once | INTRAVENOUS | Status: AC
  Administered 2020-12-09: 1000 mL via INTRAVENOUS

## 2020-12-09 MED ORDER — KETOROLAC TROMETHAMINE 60 MG/2ML IM SOLN: 60.0000 mg | Freq: Once | INTRAMUSCULAR | Status: DC

## 2020-12-09 MED ORDER — TRIAMCINOLONE ACETONIDE 40 MG/ML IJ SUSP
80.0000 mg | Freq: Once | INTRAMUSCULAR | Status: AC
  Administered 2020-12-09: 80 mg
  Filled 2020-12-09: qty 2

## 2020-12-09 MED ORDER — LIDOCAINE HCL (PF) 2 % IJ SOLN
INTRAMUSCULAR | Status: AC
  Filled 2020-12-09: qty 20

## 2020-12-09 NOTE — Progress Notes (Signed)
PROVIDER NOTE: Information contained herein reflects review and annotations entered in association with encounter. Interpretation of such information and data should be left to medically-trained personnel. Information provided to patient can be located elsewhere in the medical record under "Patient Instructions". Document created using STT-dictation technology, any transcriptional errors that may result from process are unintentional.    Patient: Kara [name]  Service Category: Procedure  Provider: Gaspar Cola, MD  DOB: 02/05/1951  DOS: 12/09/2020  Location: [facility]  MRN: [id number]  Setting: Ambulatory - outpatient  Referring Provider: Tracie Harrier, MD  Type: Established Patient  Specialty: Interventional Pain Management  PCP: Tracie Harrier, MD   Primary Reason for Visit: Interventional Pain Management Treatment. CC: Back Pain  Procedure:          Anesthesia, Analgesia, Anxiolysis:  Type: Lumbar Facet, Medial Branch Block(s)          Primary Purpose: Diagnostic Region: Posterolateral Lumbosacral Spine Level: L2, L3, L4, L5, & S1 Medial Branch Level(s). Injecting these levels blocks the L3-4, L4-5, and L5-S1 lumbar facet joints. Laterality: Bilateral  Type: Minimal (Conscious) Anxiolysis combined with Local Anesthesia Indication(s): Analgesia and Anxiety Route: Intravenous (IV) IV Access: Secured Sedation: Meaningful verbal contact was maintained at all times during the procedure  Local Anesthetic: Lidocaine 1-2%  Position: Prone   Indications: 1. Lumbar facet syndrome (Bilateral)   2. Lumbar facet arthropathy (L3-4, L4-5, and L5-S1) (Bilateral)   3. Chronic low back pain (1ry area of Pain) (Bilateral) w/o sciatica   4. DDD (degenerative disc disease), lumbosacral   5. Spondylosis without myelopathy or radiculopathy, lumbosacral region   6. Chronic anticoagulation (COUMADIN)   7. Latex precautions, history of latex allergy    Pain  Score: Pre-procedure: 10-Worst pain ever/10 Post-procedure: 0-No pain/10   Pre-op H&P Assessment:  Kara [name] is a 70 y.o. (year old), female patient, seen today for interventional treatment. She  has a past surgical history that includes Knee surgery; Carpal tunnel release (Bilateral); Colon resection sigmoid (N/A, 11/02/2016); Colostomy (N/A, 11/02/2016); Sigmoidoscopy (N/A, 11/13/2016); PULMONARY VENOGRAPHY (N/A, 11/19/2016); Colonoscopy with propofol (N/A, 02/03/2017); Colon surgery (11/02/2016); IVC FILTER INSERTION (Right, 10/2016); Colostomy reversal (N/A, 02/24/2017); Appendectomy (N/A, 02/24/2017); Lysis of adhesion (N/A, 02/24/2017); laparoscopy (N/A, 02/24/2017); Ileo loop diversion (N/A, 02/24/2017); Sigmoidoscopy (N/A, 05/19/2017); Ileostomy closure (N/A, 06/01/2017); IVC FILTER REMOVAL (N/A, 08/09/2017); Sacroplasty (N/A, 11/08/2017); Cataract extraction w/PHACO (Left, 11/21/2018); and Cataract extraction w/PHACO (Right, 12/12/2018). Kara [name] has a current medication list which includes the following prescription(s): alendronate, aspirin ec, calcium carbonate, candesartan, cholecalciferol, cyclobenzaprine, dulaglutide, gabapentin, gabapentin, glipizide, glipizide, hydrocodone-acetaminophen, [START ON 12/12/2020] hydrocodone-acetaminophen, [START ON 01/11/2021] hydrocodone-acetaminophen, meclizine, multi-vitamins, nystatin cream, ondansetron, ropinirole, tolterodine, tramadol, vitamin b-12, and warfarin, and the following Facility-Administered Medications: ketorolac, midazolam, and orphenadrine. Her primarily concern today is the Back Pain  Initial Vital Signs:  Pulse/HCG Rate: 88ECG Heart Rate: 89 Temp: 97.7 F (36.5 C) Resp: 16 BP: (!) 145/65 SpO2: 100 %  BMI: Estimated body mass index is 36.49 kg/m as calculated from the following:   Height as of this encounter: 5\' 3"  (1.6 m).   Weight as of this encounter: 206 lb (93.4 kg).  Risk Assessment: Allergies: Reviewed. She is allergic to adhesive  [tape], other, latex, and morphine and related.  Allergy Precautions: None required Coagulopathies: Reviewed. None identified.  Blood-thinner therapy: None at this time Active Infection(s): Reviewed. None identified. Kara [name] is afebrile  Site Confirmation: Kara [name] was asked to confirm the procedure and laterality before marking the site Procedure checklist:  Completed Consent: Before the procedure and under the influence of no sedative(s), amnesic(s), or anxiolytics, the patient was informed of the treatment options, risks and possible complications. To fulfill our ethical and legal obligations, as recommended by the American Medical Association's Code of Ethics, I have informed the patient of my clinical impression; the nature and purpose of the treatment or procedure; the risks, benefits, and possible complications of the intervention; the alternatives, including doing nothing; the risk(s) and benefit(s) of the alternative treatment(s) or procedure(s); and the risk(s) and benefit(s) of doing nothing. The patient was provided information about the general risks and possible complications associated with the procedure. These may include, but are not limited to: failure to achieve desired goals, infection, bleeding, organ or nerve damage, allergic reactions, paralysis, and death. In addition, the patient was informed of those risks and complications associated to Spine-related procedures, such as failure to decrease pain; infection (i.e.: Meningitis, epidural or intraspinal abscess); bleeding (i.e.: epidural hematoma, subarachnoid hemorrhage, or any other type of intraspinal or peri-dural bleeding); organ or nerve damage (i.e.: Any type of peripheral nerve, nerve root, or spinal cord injury) with subsequent damage to sensory, motor, and/or autonomic systems, resulting in permanent pain, numbness, and/or weakness of one or several areas of the body; allergic reactions; (i.e.: anaphylactic reaction);  and/or death. Furthermore, the patient was informed of those risks and complications associated with the medications. These include, but are not limited to: allergic reactions (i.e.: anaphylactic or anaphylactoid reaction(s)); adrenal axis suppression; blood sugar elevation that in diabetics may result in ketoacidosis or comma; water retention that in patients with history of congestive heart failure may result in shortness of breath, pulmonary edema, and decompensation with resultant heart failure; weight gain; swelling or edema; medication-induced neural toxicity; particulate matter embolism and blood vessel occlusion with resultant organ, and/or nervous system infarction; and/or aseptic necrosis of one or more joints. Finally, the patient was informed that Medicine is not an exact science; therefore, there is also the possibility of unforeseen or unpredictable risks and/or possible complications that may result in a catastrophic outcome. The patient indicated having understood very clearly. We have given the patient no guarantees and we have made no promises. Enough time was given to the patient to ask questions, all of which were answered to the patient's satisfaction. Ms. Grandpre has indicated that she wanted to continue with the procedure. Attestation: I, the ordering provider, attest that I have discussed with the patient the benefits, risks, side-effects, alternatives, likelihood of achieving goals, and potential problems during recovery for the procedure that I have provided informed consent. Date  Time: 12/09/2020 10:00 AM  Pre-Procedure Preparation:  Monitoring: As per clinic protocol. Respiration, ETCO2, SpO2, BP, heart rate and rhythm monitor placed and checked for adequate function Safety Precautions: Patient was assessed for positional comfort and pressure points before starting the procedure. Time-out: I initiated and conducted the "Time-out" before starting the procedure, as per protocol.  The patient was asked to participate by confirming the accuracy of the "Time Out" information. Verification of the correct person, site, and procedure were performed and confirmed by me, the nursing staff, and the patient. "Time-out" conducted as per Joint Commission's Universal Protocol (UP.01.01.01). Time: 1108  Description of Procedure:          Laterality: Bilateral. The procedure was performed in identical fashion on both sides. Levels:  L2, L3, L4, L5, & S1 Medial Branch Level(s) Area Prepped: Posterior Lumbosacral Region DuraPrep (Iodine Povacrylex [0.7% available iodine] and Isopropyl Alcohol, 74%  w/w) Safety Precautions: Aspiration looking for blood return was conducted prior to all injections. At no point did we inject any substances, as a needle was being advanced. Before injecting, the patient was told to immediately notify me if she was experiencing any new onset of "ringing in the ears, or metallic taste in the mouth". No attempts were made at seeking any paresthesias. Safe injection practices and needle disposal techniques used. Medications properly checked for expiration dates. SDV (single dose vial) medications used. After the completion of the procedure, all disposable equipment used was discarded in the proper designated medical waste containers. Local Anesthesia: Protocol guidelines were followed. The patient was positioned over the fluoroscopy table. The area was prepped in the usual manner. The time-out was completed. The target area was identified using fluoroscopy. A 12-in long, straight, sterile hemostat was used with fluoroscopic guidance to locate the targets for each level blocked. Once located, the skin was marked with an approved surgical skin marker. Once all sites were marked, the skin (epidermis, dermis, and hypodermis), as well as deeper tissues (fat, connective tissue and muscle) were infiltrated with a small amount of a short-acting local anesthetic, loaded on a 10cc  syringe with a 25G, 1.5-in  Needle. An appropriate amount of time was allowed for local anesthetics to take effect before proceeding to the next step. Local Anesthetic: Lidocaine 2.0% The unused portion of the local anesthetic was discarded in the proper designated containers. Technical explanation of process:  L2 Medial Branch Nerve Block (MBB): The target area for the L2 medial branch is at the junction of the postero-lateral aspect of the superior articular process and the superior, posterior, and medial edge of the transverse process of L3. Under fluoroscopic guidance, a Quincke needle was inserted until contact was made with os over the superior postero-lateral aspect of the pedicular shadow (target area). After negative aspiration for blood, 0.5 mL of the nerve block solution was injected without difficulty or complication. The needle was removed intact. L3 Medial Branch Nerve Block (MBB): The target area for the L3 medial branch is at the junction of the postero-lateral aspect of the superior articular process and the superior, posterior, and medial edge of the transverse process of L4. Under fluoroscopic guidance, a Quincke needle was inserted until contact was made with os over the superior postero-lateral aspect of the pedicular shadow (target area). After negative aspiration for blood, 0.5 mL of the nerve block solution was injected without difficulty or complication. The needle was removed intact. L4 Medial Branch Nerve Block (MBB): The target area for the L4 medial branch is at the junction of the postero-lateral aspect of the superior articular process and the superior, posterior, and medial edge of the transverse process of L5. Under fluoroscopic guidance, a Quincke needle was inserted until contact was made with os over the superior postero-lateral aspect of the pedicular shadow (target area). After negative aspiration for blood, 0.5 mL of the nerve block solution was injected without difficulty  or complication. The needle was removed intact. L5 Medial Branch Nerve Block (MBB): The target area for the L5 medial branch is at the junction of the postero-lateral aspect of the superior articular process and the superior, posterior, and medial edge of the sacral ala. Under fluoroscopic guidance, a Quincke needle was inserted until contact was made with os over the superior postero-lateral aspect of the pedicular shadow (target area). After negative aspiration for blood, 0.5 mL of the nerve block solution was injected without difficulty or complication. The  needle was removed intact. S1 Medial Branch Nerve Block (MBB): The target area for the S1 medial branch is at the posterior and inferior 6 o'clock position of the L5-S1 facet joint. Under fluoroscopic guidance, the Quincke needle inserted for the L5 MBB was redirected until contact was made with os over the inferior and postero aspect of the sacrum, at the 6 o' clock position under the L5-S1 facet joint (Target area). After negative aspiration for blood, 0.5 mL of the nerve block solution was injected without difficulty or complication. The needle was removed intact.  Nerve block solution: 0.2% PF-Ropivacaine + Triamcinolone (40 mg/mL) diluted to a final concentration of 4 mg of Triamcinolone/mL of Ropivacaine The unused portion of the solution was discarded in the proper designated containers. Procedural Needles: 22-gauge, 3.5-inch, Quincke needles used for all levels.  Once the entire procedure was completed, the treated area was cleaned, making sure to leave some of the prepping solution back to take advantage of its long term bactericidal properties.      Illustration of the posterior view of the lumbar spine and the posterior neural structures. Laminae of L2 through S1 are labeled. DPRL5, dorsal primary ramus of L5; DPRS1, dorsal primary ramus of S1; DPR3, dorsal primary ramus of L3; FJ, facet (zygapophyseal) joint L3-L4; I, inferior  articular process of L4; LB1, lateral branch of dorsal primary ramus of L1; IAB, inferior articular branches from L3 medial branch (supplies L4-L5 facet joint); IBP, intermediate branch plexus; MB3, medial branch of dorsal primary ramus of L3; NR3, third lumbar nerve root; S, superior articular process of L5; SAB, superior articular branches from L4 (supplies L4-5 facet joint also); TP3, transverse process of L3.  Vitals:   12/09/20 1119 12/09/20 1126 12/09/20 1136 12/09/20 1147  BP: 123/68 (!) 122/58 97/84 115/83  Pulse:      Resp: (!) 28 20 19 16   Temp:  (!) 97.5 F (36.4 C)  (!) 97.3 F (36.3 C)  SpO2: 96% 94% 94% 96%  Weight:      Height:         Start Time: 1108 hrs. End Time: 1118 hrs.  Imaging Guidance (Spinal):          Type of Imaging Technique: Fluoroscopy Guidance (Spinal) Indication(s): Assistance in needle guidance and placement for procedures requiring needle placement in or near specific anatomical locations not easily accessible without such assistance. Exposure Time: Please see nurses notes. Contrast: None used. Fluoroscopic Guidance: I was personally present during the use of fluoroscopy. "Tunnel Vision Technique" used to obtain the best possible view of the target area. Parallax error corrected before commencing the procedure. "Direction-depth-direction" technique used to introduce the needle under continuous pulsed fluoroscopy. Once target was reached, antero-posterior, oblique, and lateral fluoroscopic projection used confirm needle placement in all planes. Images permanently stored in EMR. Interpretation: No contrast injected. I personally interpreted the imaging intraoperatively. Adequate needle placement confirmed in multiple planes. Permanent images saved into the patient's record.  Antibiotic Prophylaxis:   Anti-infectives (From admission, onward)    None      Indication(s): None identified  Post-operative Assessment:  Post-procedure Vital Signs:   Pulse/HCG Rate: 8883 Temp: (!) 97.3 F (36.3 C) Resp: 16 BP: 115/83 SpO2: 96 %  EBL: None  Complications: No immediate post-treatment complications observed by team, or reported by patient.  Note: The patient tolerated the entire procedure well. A repeat set of vitals were taken after the procedure and the patient was kept under observation following institutional policy, for this  type of procedure. Post-procedural neurological assessment was performed, showing return to baseline, prior to discharge. The patient was provided with post-procedure discharge instructions, including a section on how to identify potential problems. Should any problems arise concerning this procedure, the patient was given instructions to immediately contact us, at any time, without hesitation. In any case, we plan to contact the patient by telephone for a follow-up status report regarding this interventional procedure.  Comments:  No additional relevant information.  Plan of Care  Orders:  Orders Placed This Encounter  Procedures   LUMBAR FACET(MEDIAL BRANCH NERVE BLOCK) MBNB    Scheduling Instructions:     Procedure: Lumbar facet block (AKA.: Lumbosacral medial branch nerve block)     Side: Bilateral     Level: L3-4, L4-5, & L5-S1 Facets (L2, L3, L4, L5, & S1 Medial Branch Nerves)     Sedation: Patient's choice.     Timeframe: Today    Order Specific Question:   Where will this procedure be performed?    Answer:   [name]   DG PAIN [facility] C-ARM 1-60 MIN NO REPORT    Intraoperative interpretation by procedural physician at [place].    Standing Status:   Standing    Number of Occurrences:   1    Order Specific Question:   Reason for exam:    Answer:   Assistance in needle guidance and placement for procedures requiring needle placement in or near specific anatomical locations not easily accessible without such assistance.   DG Si Joints    Standing Status:   Future     Standing Expiration Date:   01/09/2021    Scheduling Instructions:     Imaging must be done as soon as possible. Inform patient that order will expire within 30 days and I will not renew it.    Order Specific Question:   Reason for Exam (SYMPTOM  OR DIAGNOSIS REQUIRED)    Answer:   Sacroiliac joint pain    Order Specific Question:   Preferred imaging location?    Answer:   [facility]    Order Specific Question:   Call Results- Best Contact Number?    Answer:   [phone] ([facility])    Order Specific Question:   Release to patient    Answer:   Immediate   Informed Consent Details: Physician/Practitioner Attestation; Transcribe to consent form and obtain patient signature    Nursing Order: Transcribe to consent form and obtain patient signature. Note: Always confirm laterality of pain with Ms. Mokry, before procedure.    Order Specific Question:   Physician/Practitioner attestation of informed consent for procedure/surgical case    Answer:   I, the physician/practitioner, attest that I have discussed with the patient the benefits, risks, side effects, alternatives, likelihood of achieving goals and potential problems during recovery for the procedure that I have provided informed consent.    Order Specific Question:   Procedure    Answer:   Lumbar Facet Block  under fluoroscopic guidance    Order Specific Question:   Physician/Practitioner performing the procedure    Answer:   Yousuf Ager A. Dossie Arbour MD    Order Specific Question:   Indication/Reason    Answer:   Low Back Pain, with our without leg pain, due to Facet Joint Arthralgia (Joint Pain) Spondylosis (Arthritis of the Spine), without myelopathy or radiculopathy (Nerve Damage).   Care order/instruction: Please confirm that the patient has stopped the Coumadin (Warfarin) X 5 days prior to  procedure or surgery.    Please confirm that the patient has stopped the Coumadin (Warfarin) X 5 days prior to procedure or surgery.     Standing Status:   Standing    Number of Occurrences:   1   Provide equipment / supplies at bedside    "Block Tray" (Disposable  single use) Needle type: SpinalSpinal Amount/quantity: 4 Size: Medium (5-inch) Gauge: 22G    Standing Status:   Standing    Number of Occurrences:   1    Order Specific Question:   Specify    Answer:   Block Tray   Bleeding precautions    Standing Status:   Standing    Number of Occurrences:   1   Latex precautions    Activate Latex-Free Protocol.    Standing Status:   Standing    Number of Occurrences:   1    Chronic Opioid Analgesic:  Hydrocodone/APAP 5/325, 1 tab PO QD PRN (5 mg/day of hydrocodone) (5 MME) + tramadol 50 mg, 1 tab p.o. every 8 hours (150 mg/day of tramadol) (15 MME) MME: 20 mg/day.   Medications ordered for procedure: Meds ordered this encounter  Medications   lidocaine (XYLOCAINE) 2 % (with pres) injection 400 mg   lactated ringers infusion 1,000 mL   midazolam (VERSED) 5 MG/5ML injection 1-2 mg    Make sure Flumazenil is available in the pyxis when using this medication. If oversedation occurs, administer 0.2 mg IV over 15 sec. If after 45 sec no response, administer 0.2 mg again over 1 min; may repeat at 1 min intervals; not to exceed 4 doses (1 mg)   ropivacaine (PF) 2 mg/mL (0.2%) (NAROPIN) injection 18 mL   triamcinolone acetonide (KENALOG-40) injection 80 mg   ketorolac (TORADOL) injection 60 mg   orphenadrine (NORFLEX) injection 60 mg    Medications administered: We administered lidocaine, lactated ringers, midazolam, ropivacaine (PF) 2 mg/mL (0.2%), and triamcinolone acetonide.  See the medical record for exact dosing, route, and time of administration.  Follow-up plan:   Return in about 2 weeks (around 12/23/2020) for procedure day (afternoon VV) (PPE) to evaluate sacroiliac joint x-rays.      Interventional Therapies  Risk  Complexity  Complicating Factors:   Estimated body mass index is 36.49 kg/m as calculated  from the following:   Height as of this encounter: 5\' 3"  (1.6 m).   Weight as of this encounter: 206 lb (93.4 kg). 1.  Coumadin anticoagulation  (Stop: 5 days  Re-start: 2hrs) 2.  Latex allergy  3.  Morbid obesity (class III) (BMI>40)  4.  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   Under consideration:   Palliative procedures until BMI<35.   Completed:   Palliative/Therapeutic left L4-5 LESI x1 (11/10/2017) Palliative left lumbar facet block x14 (06/24/2020) Palliative right lumbar facet block(s) x19 (08/05/2020) Palliative right SI joint block x8 (not as effective as facet Blk) (03/08/2019) Diagnostic left SI joint block x1 (not as effective as facet Blk) (03/08/2019) Palliative/Therapeutic (Midline) caudal ESI x3 (06/08/2018)   Palliative options:   Palliative/Therapeutic left L4-5 LESI #2   Palliative left lumbar facet block #15  Palliative right lumbar facet block(s) #19  Palliative right SI joint block #9  Diagnostic left SI joint block #2  Palliative/Therapeutic (Midline) caudal ESI #4       Recent Visits Date Type Provider Dept  11/12/20  Office Visit Milinda Pointer, MD [name]  10/21/20 Telemedicine Milinda Pointer, MD [name]  09/30/20 Procedure visit Milinda Pointer, MD [name]  Showing recent visits within past 90 days and meeting all other requirements Today's Visits Date Type Provider Dept  12/09/20 Procedure visit Milinda Pointer, MD [name]  Showing today's visits and meeting all other requirements Future Appointments Date Type Provider Dept  12/30/20 Appointment Milinda Pointer, MD [name]  Showing future appointments within next 90 days and meeting all other requirements Disposition: Discharge home  Discharge (Date  Time): 12/09/2020; 1148 hrs.   Primary Care Physician:  Tracie Harrier, MD Location: [facility] Note by: Gaspar Cola, MD Date: 12/09/2020; Time: 12:33 PM  Disclaimer:  Medicine is not an exact science. The only guarantee in medicine is that nothing is guaranteed. It is important to note that the decision to proceed with this intervention was based on the information collected from the patient. The Data and conclusions were drawn from the patient's questionnaire, the interview, and the physical examination. Because the information was provided in large part by the patient, it cannot be guaranteed that it has not been purposely or unconsciously manipulated. Every effort has been made to obtain as much relevant data as possible for this evaluation. It is important to note that the conclusions that lead to this procedure are derived in large part from the available data. Always take into account that the treatment will also be dependent on availability of resources and existing treatment guidelines, considered by other Pain Management Practitioners as being common knowledge and practice, at the time of the intervention. For Medico-Legal purposes, it is also important to point out that variation in procedural techniques and pharmacological choices are the acceptable norm. The indications, contraindications, technique, and results of the above procedure should only be interpreted and judged by a Board-Certified Interventional Pain Specialist with extensive familiarity and expertise in the same exact procedure and technique.

## 2020-12-09 NOTE — Patient Instructions (Signed)

## 2020-12-09 NOTE — Progress Notes (Signed)
Safety precautions to be maintained throughout the outpatient stay will include: orient to surroundings, keep bed in low position, maintain call bell within reach at all times, provide assistance with transfer out of bed and ambulation.  

## 2020-12-10 ENCOUNTER — Telehealth: Payer: Self-pay | Admitting: *Deleted

## 2020-12-10 NOTE — Telephone Encounter (Signed)
Spoke with patient re; procedure on yesterday.  No questions or concerns.

## 2020-12-15 ENCOUNTER — Ambulatory Visit
Admission: RE | Admit: 2020-12-15 | Discharge: 2020-12-15 | Disposition: A | Discharge status: Home / Self Care | Payer: PPO | Source: Ambulatory Visit | Attending: Internal Medicine | Admitting: Internal Medicine

## 2020-12-15 ENCOUNTER — Other Ambulatory Visit: Payer: Self-pay

## 2020-12-15 DIAGNOSIS — Z1231 Encounter for screening mammogram for malignant neoplasm of breast: Secondary | ICD-10-CM | POA: Diagnosis not present

## 2020-12-18 ENCOUNTER — Encounter: Payer: Self-pay | Admitting: *Deleted

## 2020-12-29 ENCOUNTER — Telehealth: Payer: Self-pay

## 2020-12-29 NOTE — Telephone Encounter (Signed)
LM for patient to call office for pre virtual appointment questions.  

## 2020-12-29 NOTE — Progress Notes (Signed)
Patient: Kara [name]  Service Category: E/M  Provider: Gaspar Cola, MD  DOB: 03-08-1951  DOS: 12/30/2020  Location: Office  MRN: [id number]  Setting: Ambulatory outpatient  Referring Provider: Tracie Harrier, MD  Type: Established Patient  Specialty: Interventional Pain Management  PCP: Tracie Harrier, MD  Location: Remote location  Delivery: TeleHealth     Virtual Encounter - Pain Management PROVIDER NOTE: Information contained herein reflects review and annotations entered in association with encounter. Interpretation of such information and data should be left to medically-trained personnel. Information provided to patient can be located elsewhere in the medical record under "Patient Instructions". Document created using STT-dictation technology, any transcriptional errors that may result from process are unintentional.    Contact & Pharmacy Preferred: [phone] Home: [phone] (home) Mobile: [phone] (mobile) E-mail: [email or web address]  [place], [address] [place] [address] Phone: [phone] Fax: [phone]   Pre-screening  [name] offered "in-person" vs "virtual" encounter. She indicated preferring virtual for this encounter.   Reason COVID-19*  Social distancing based on CDC and AMA recommendations.   I contacted Kara [name] on 12/30/2020 via telephone.      I clearly identified myself as Gaspar Cola, MD. I verified that I was speaking with the correct person using two identifiers (Name: Kara [name], and date of birth: [birth year]).  Consent I sought verbal advanced consent from Kara [name] for virtual visit interactions. I informed Kara [name] of possible security and privacy concerns, risks, and limitations associated with providing "not-in-person" medical evaluation and management services. I also informed Kara [name] of the availability of "in-person"  appointments. Finally, I informed her that there would be a charge for the virtual visit and that she could be  personally, fully or partially, financially responsible for it. Kara [name] expressed understanding and agreed to proceed.   Historic Elements   Kara [name] is a 70 y.o. year old, female patient evaluated today after our last contact on 12/09/2020. 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 She also  has a past surgical history that includes Knee surgery; Carpal tunnel release (Bilateral); Colon resection sigmoid (N/A, 11/02/2016); Colostomy (N/A, 11/02/2016); Sigmoidoscopy (N/A, 11/13/2016); PULMONARY VENOGRAPHY (N/A, 11/19/2016); Colonoscopy with propofol (N/A, 02/03/2017); Colon surgery (11/02/2016); IVC FILTER INSERTION (Right, 10/2016); Colostomy reversal (N/A, 02/24/2017); Appendectomy (N/A, 02/24/2017); Lysis of adhesion (N/A, 02/24/2017); laparoscopy (N/A, 02/24/2017); Ileo loop diversion (N/A, 02/24/2017); Sigmoidoscopy (N/A, 05/19/2017); Ileostomy closure (N/A, 06/01/2017); IVC FILTER REMOVAL (N/A, 08/09/2017); Sacroplasty (N/A, 11/08/2017); Cataract extraction w/PHACO (Left, 11/21/2018); and Cataract extraction w/PHACO (Right, 12/12/2018). Kara [name] has a current medication list which includes the following prescription(s): alendronate, aspirin ec, calcium carbonate, candesartan, cholecalciferol, cyclobenzaprine, dulaglutide, gabapentin, gabapentin, glipizide, glipizide, hydrocodone-acetaminophen, [START ON 01/11/2021] hydrocodone-acetaminophen, meclizine, multi-vitamins, nystatin cream, ondansetron, ropinirole, tolterodine, tramadol, vitamin b-12, warfarin, and hydrocodone-acetaminophen. She  reports that she has never  smoked. She has never used smokeless tobacco. She reports that she does not drink alcohol and does not use drugs. Kara [name] is allergic to adhesive [tape], other, latex, and morphine and related.   HPI  Today, she is being contacted for a post-procedure assessment.  Post-Procedure Evaluation  Procedure (12/09/2020):  Type: Lumbar Facet, Medial Branch Block(s)  Primary Purpose: Diagnostic Region: Posterolateral Lumbosacral Spine Level: L2, L3, L4, L5, & S1 Medial Branch Level(s). Injecting these levels blocks the L3-4, L4-5, and L5-S1 lumbar facet joints. Laterality: Bilateral  Pre-procedure pain level: 10/10 Post-procedure: 0/10 (100% relief)  Anxiolysis: Minimal conscious anxiolysis  Effectiveness during initial hour after procedure (Ultra-Short Term Relief): 100 %.  Local anesthetic used: Long-acting (4-6 hours) Effectiveness: Defined as any analgesic benefit obtained secondary to the administration of local anesthetics. This carries significant diagnostic value as to the etiological location, or anatomical origin, of the pain. Duration of benefit is expected to coincide with the duration of the local anesthetic used.  Effectiveness during initial 4-6 hours after procedure (Short-Term Relief): 100 %.  Long-term benefit: Defined as any relief past the pharmacologic duration of the local anesthetics.  Effectiveness past the initial 6 hours after procedure (Long-Term Relief): 80 % (ongoing).  Benefits, current: Defined as benefit present at the time of this evaluation.   Analgesia:   The patient indicates having an ongoing 80% relief of her low back pain. Function: Kara [name] reports improvement in function ROM: Kara [name] reports improvement in ROM  Pharmacotherapy Assessment   Analgesic: Hydrocodone/APAP 5/325, 1 tab PO QD PRN (5 mg/day of hydrocodone) (5 MME) + tramadol 50 mg, 1 tab p.o. every 8 hours (150 mg/day of tramadol) (15 MME) MME: 20 mg/day.    Monitoring: [place] PMP: PDMP reviewed during this encounter.       Pharmacotherapy: No side-effects or adverse reactions reported. Compliance: No problems identified. Effectiveness: Clinically acceptable. Plan: Refer to "POC". UDS:  Summary  Date Value Ref Range Status  11/12/2020 Note  Final    Comment:    ==================================================================== [employer] Select 13 (MW) ==================================================================== Test                             Result       Flag       Units  Drug Present and Declared for Prescription Verification   Norhydrocodone                 61           EXPECTED   ng/mg creat    Norhydrocodone is an expected metabolite of hydrocodone.    Tramadol                       >4202        EXPECTED   ng/mg creat   O-Desmethyltramadol            >4202        EXPECTED   ng/mg creat   N-Desmethyltramadol            1712         EXPECTED   ng/mg creat    Source of tramadol is a prescription medication. O-desmethyltramadol    and N-desmethyltramadol are expected metabolites of tramadol.  Drug Absent but Declared for Prescription Verification   Hydrocodone                    Not Detected UNEXPECTED ng/mg creat    Hydrocodone is almost always present in patients taking this drug    consistently. Absence of hydrocodone could be due to lapse of time    since the last dose or unusual pharmacokinetics (rapid metabolism).  ==================================================================== Test  Result    Flag   Units      Ref Range   Creatinine              119              mg/dL      >=20 ==================================================================== Declared Medications:  The flagging and interpretation on this report are based on the  following declared medications.  Unexpected results may arise from  inaccuracies in the declared medications.   **Note: The testing scope of this panel  includes these medications:   Hydrocodone  Tramadol   **Note: The testing scope of this panel does not include the  following reported medications:   Acetaminophen  Alendronate (Fosamax)  Aspirin  Calcium  Candesartan  Cholecalciferol  Cyanocobalamin  Cyclobenzaprine  Dulaglutide  Gabapentin  Glipizide  Meclizine  Multivitamin  Nystatin  Ondansetron  Ropinirole  Tolterodine  Warfarin ==================================================================== For clinical consultation, please call [phone]. ====================================================================      Laboratory Chemistry Profile   Renal Lab Results  Component Value Date   BUN 17 08/15/2020   CREATININE 0.70 10/17/2020   GFRAA >60 07/09/2019   GFRNONAA >60 08/15/2020    Hepatic Lab Results  Component Value Date   AST 22 08/15/2020   ALT 33 08/15/2020   ALBUMIN 3.2 (L) 08/15/2020   ALKPHOS 41 08/15/2020   LIPASE 38 08/11/2020    Electrolytes Lab Results  Component Value Date   NA 137 08/15/2020   K 3.8 08/15/2020   CL 109 08/15/2020   CALCIUM 8.4 (L) 08/15/2020   MG 1.7 08/14/2020   PHOS 3.3 11/07/2016    Bone Lab Results  Component Value Date   25OHVITD1 25 (L) 11/02/2017   25OHVITD2 <1.0 11/02/2017   25OHVITD3 25 11/02/2017    Inflammation (CRP: Acute Phase) (ESR: Chronic Phase) Lab Results  Component Value Date   CRP 1.7 (H) 08/15/2020   ESRSEDRATE 12 11/02/2017         Note: Above Lab results reviewed.  Imaging  MM 3D SCREEN BREAST BILATERAL CLINICAL DATA:  Screening.  EXAM: DIGITAL SCREENING BILATERAL MAMMOGRAM WITH TOMOSYNTHESIS AND CAD  TECHNIQUE: Bilateral screening digital craniocaudal and mediolateral oblique mammograms were obtained. Bilateral screening digital breast tomosynthesis was performed. The images were evaluated with computer-aided detection.  COMPARISON:  Previous exam(s).  ACR Breast Density Category b: There are scattered areas  of fibroglandular density.  FINDINGS: There are no findings suspicious for malignancy.  IMPRESSION: No mammographic evidence of malignancy. A result letter of this screening mammogram will be mailed directly to the patient.  RECOMMENDATION: Screening mammogram in one year. (Code:[id number]-B-01Y)  BI-RADS CATEGORY  1: Negative.  Electronically Signed   By: Audie Pinto M.D.   On: 12/16/2020 16:40  Assessment  There were no encounter diagnoses.  Plan of Care  Problem-specific:  No problem-specific Assessment & Plan notes found for this encounter.  Ms. Tahira Olivarez Kaseman has a current medication list which includes the following long-term medication(s): calcium carbonate, candesartan, glipizide, glipizide, hydrocodone-acetaminophen, [START ON 01/11/2021] hydrocodone-acetaminophen, ropinirole, tramadol, warfarin, and hydrocodone-acetaminophen.  Pharmacotherapy (Medications Ordered): No orders of the defined types were placed in this encounter.  Orders:  No orders of the defined types were placed in this encounter.  Follow-up plan:   No follow-ups on file.     Interventional Therapies  Risk  Complexity  Complicating Factors:   Estimated body mass index is 36.49 kg/m as calculated from the following:   Height as of this encounter: 5'  3" (1.6 m).   Weight as of this encounter: 206 lb (93.4 kg). 1.  Coumadin anticoagulation  (Stop: 5 days  Re-start: 2hrs) 2.  Latex allergy  3.  Morbid obesity (class III) (BMI>40)  4.  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   Under consideration:   Palliative procedures until BMI<35.   Completed:   Palliative/Therapeutic left L4-5 LESI x1 (11/10/2017) Palliative left lumbar facet block x14 (06/24/2020) Palliative right lumbar facet block(s) x19 (08/05/2020) Palliative right SI joint block x8 (not as effective as facet Blk)  (03/08/2019) Diagnostic left SI joint block x1 (not as effective as facet Blk) (03/08/2019) Palliative/Therapeutic (Midline) caudal ESI x3 (06/08/2018)   Palliative options:   Palliative/Therapeutic left L4-5 LESI #2   Palliative left lumbar facet block #15  Palliative right lumbar facet block(s) #19  Palliative right SI joint block #9  Diagnostic left SI joint block #2  Palliative/Therapeutic (Midline) caudal ESI #4        Recent Visits Date Type Provider Dept  12/09/20 Procedure visit Milinda Pointer, MD [name]  11/12/20 Office Visit Milinda Pointer, MD [name]  10/21/20 Telemedicine Milinda Pointer, MD [name]  Showing recent visits within past 90 days and meeting all other requirements Today's Visits Date Type Provider Dept  12/30/20 Telemedicine Milinda Pointer, MD [name]  Showing today's visits and meeting all other requirements Future Appointments No visits were found meeting these conditions. Showing future appointments within next 90 days and meeting all other requirements I discussed the assessment and treatment plan with the patient. The patient was provided an opportunity to ask questions and all were answered. The patient agreed with the plan and demonstrated an understanding of the instructions.  Patient advised to call back or seek an in-person evaluation if the symptoms or condition worsens.  Duration of encounter: 12 minutes.  Note by: Gaspar Cola, MD Date: 12/30/2020; Time: 4:37 PM

## 2020-12-30 ENCOUNTER — Telehealth: Payer: Self-pay

## 2020-12-30 ENCOUNTER — Ambulatory Visit: Payer: PPO | Attending: Pain Medicine | Admitting: Pain Medicine

## 2020-12-30 ENCOUNTER — Other Ambulatory Visit: Payer: Self-pay

## 2020-12-30 DIAGNOSIS — M47817 Spondylosis without myelopathy or radiculopathy, lumbosacral region: Secondary | ICD-10-CM

## 2020-12-30 DIAGNOSIS — R791 Abnormal coagulation profile: Secondary | ICD-10-CM | POA: Diagnosis not present

## 2020-12-30 NOTE — Telephone Encounter (Signed)
LM for patient to call office for pre virtual appointment questions.  

## 2021-01-08 ENCOUNTER — Telehealth: Payer: Self-pay | Admitting: Pain Medicine

## 2021-01-08 NOTE — Telephone Encounter (Signed)
Patient wants to know if she can come in for injections 2 weeks from today. Please advise patient. And let Ty or Precious know.

## 2021-01-13 DIAGNOSIS — R791 Abnormal coagulation profile: Secondary | ICD-10-CM | POA: Diagnosis not present

## 2021-01-13 NOTE — Telephone Encounter (Signed)
Message left with FN on whether to schedule VV or procedure.

## 2021-01-15 ENCOUNTER — Telehealth: Payer: Self-pay | Admitting: Pain Medicine

## 2021-01-15 NOTE — Telephone Encounter (Signed)
There is an order for a lumbar facet. Im not sure of Dr. Adalberto Cole current preferences, can you go ahead and schedule?

## 2021-01-15 NOTE — Telephone Encounter (Signed)
Patient is calling again to see if she can get a procedure. No places to bring her in for eval to get a procedure. Please advise patient. Ins. Does not require authorization and she must stop Warfarin 5 days before procedure.

## 2021-01-15 NOTE — Telephone Encounter (Signed)
error 

## 2021-01-19 NOTE — Progress Notes (Signed)
Pet scan results. He is scheduled for tumor board on 9/15 and to see you afterwards.   Faythe Casa, NP 01/19/2021 9:51 AM

## 2021-01-20 DIAGNOSIS — E1165 Type 2 diabetes mellitus with hyperglycemia: Secondary | ICD-10-CM | POA: Diagnosis not present

## 2021-01-20 DIAGNOSIS — E1142 Type 2 diabetes mellitus with diabetic polyneuropathy: Secondary | ICD-10-CM | POA: Diagnosis not present

## 2021-01-20 DIAGNOSIS — Z7901 Long term (current) use of anticoagulants: Secondary | ICD-10-CM | POA: Diagnosis not present

## 2021-01-20 DIAGNOSIS — I10 Essential (primary) hypertension: Secondary | ICD-10-CM | POA: Diagnosis not present

## 2021-01-20 DIAGNOSIS — I700 Atherosclerosis of aorta: Secondary | ICD-10-CM | POA: Diagnosis not present

## 2021-01-22 ENCOUNTER — Telehealth: Payer: Self-pay | Admitting: Pain Medicine

## 2021-01-22 NOTE — Telephone Encounter (Signed)
Spoke with patient and she is requesting a bilateral Lumbar Facet block.  Does she need to make an appointment for this or can you put the order in?

## 2021-01-22 NOTE — Telephone Encounter (Signed)
Attempted to call patient to assess her pain so that I could relay to Dr Dossie Arbour.  There was no answer when I tried to call patient.

## 2021-01-29 ENCOUNTER — Telehealth: Payer: Self-pay

## 2021-01-29 NOTE — Telephone Encounter (Signed)
Dr Dossie Arbour, she would like a procedure.  Her pain is in her low back and goes down the back of both legs to the knee.  She states that she wanted the facet block.  There are of course no prn orders, does she need to come in for an eval first?

## 2021-01-29 NOTE — Telephone Encounter (Signed)
Pt states someone was suppose to call her back in regards to having a procedure

## 2021-02-04 ENCOUNTER — Other Ambulatory Visit: Payer: Self-pay

## 2021-02-04 ENCOUNTER — Ambulatory Visit
Admission: RE | Admit: 2021-02-04 | Discharge: 2021-02-04 | Disposition: A | Discharge status: Home / Self Care | Payer: PPO | Source: Ambulatory Visit | Attending: Oncology | Admitting: Oncology

## 2021-02-04 ENCOUNTER — Ambulatory Visit: Payer: PPO

## 2021-02-04 ENCOUNTER — Telehealth: Payer: Self-pay | Admitting: Pain Medicine

## 2021-02-04 DIAGNOSIS — R911 Solitary pulmonary nodule: Secondary | ICD-10-CM | POA: Insufficient documentation

## 2021-02-04 DIAGNOSIS — I700 Atherosclerosis of aorta: Secondary | ICD-10-CM | POA: Diagnosis not present

## 2021-02-04 NOTE — Telephone Encounter (Signed)
I spoke with Dr. Dossie Arbour about this regarding another patient. He wants them to have an appt; It may be a VV if insurance does not require a face-to-face.

## 2021-02-05 ENCOUNTER — Other Ambulatory Visit: Payer: PPO

## 2021-02-05 DIAGNOSIS — M9902 Segmental and somatic dysfunction of thoracic region: Secondary | ICD-10-CM | POA: Diagnosis not present

## 2021-02-05 DIAGNOSIS — M531 Cervicobrachial syndrome: Secondary | ICD-10-CM | POA: Diagnosis not present

## 2021-02-05 DIAGNOSIS — M9901 Segmental and somatic dysfunction of cervical region: Secondary | ICD-10-CM | POA: Diagnosis not present

## 2021-02-05 NOTE — Progress Notes (Signed)
Tumor Board Documentation  Kara [name] was presented by Dr Janese Banks at our Tumor Board on 02/05/2021, which included representatives from medical oncology, radiation oncology, surgical, radiology, pathology, navigation, internal medicine, palliative care, research, nutrition, genetics, pharmacy, pulmonology.  Kara [name] currently presents as a current patient, for discussion with history of the following treatments: active survellience.  Additionally, we reviewed previous medical and familial history, history of present illness, and recent lab results along with all available histopathologic and imaging studies. The tumor board considered available treatment options and made the following recommendations: Biopsy, Radiation therapy (primary modality) (Robotic Assisted) Dr Baruch Gouty agrees to do SBRT if patient refuses biopsy  The following procedures/referrals were also placed: No orders of the defined types were placed in this encounter.   Clinical Trial Status: not discussed   Staging used: To be determined AJCC Staging:       Group: Right Upper Lobe Nodule   National site-specific guidelines   were discussed with respect to the case.  Tumor board is a meeting of clinicians from various specialty areas who evaluate and discuss patients for whom a multidisciplinary approach is being considered. Final determinations in the plan of care are those of the provider(s). The responsibility for follow up of recommendations given during tumor board is that of the provider.   Today's extended care, comprehensive team conference, Kara [name] was not present for the discussion and was not examined.   Multidisciplinary Tumor Board is a multidisciplinary case peer review process.  Decisions discussed in the Multidisciplinary Tumor Board reflect the opinions of the specialists present at the conference without having examined the patient.  Ultimately, treatment and diagnostic decisions rest with the primary  provider(s) and the patient.

## 2021-02-06 ENCOUNTER — Other Ambulatory Visit: Payer: Self-pay

## 2021-02-06 ENCOUNTER — Encounter: Payer: Self-pay | Admitting: Oncology

## 2021-02-06 ENCOUNTER — Inpatient Hospital Stay: Payer: PPO | Admitting: Oncology

## 2021-02-06 ENCOUNTER — Inpatient Hospital Stay: Payer: PPO | Attending: Oncology | Admitting: Oncology

## 2021-02-06 VITALS — BP 127/67 | HR 95 | Temp 96.8°F | Resp 18 | Wt 208.4 lb

## 2021-02-06 DIAGNOSIS — R911 Solitary pulmonary nodule: Secondary | ICD-10-CM | POA: Insufficient documentation

## 2021-02-07 NOTE — Progress Notes (Signed)
Hematology/Oncology Consult note [facility]  [phone] [phone]  Patient Care Team: Tracie Harrier, MD as PCP - General (Internal Medicine) Telford Nab, RN as Oncology Nurse Navigator   Name of the patient: Kara [name]  [id number]  January 14, 1951   Date of visit: 02/07/21  Diagnosis- RML lung mass concerning for lung cancer  Chief complaint/ Reason for visit- discuss CT scan results and further management  Heme/Onc history: Patient is a 70 year old female underwent CT chest with contrast in May 2022 to follow-up on lung nodule that was incidentally noted on CT abdomen in March 2022 when she was admitted for [place].  At that time she was noted to have a right lower lobe 1.3 x 1.1 cm right lower lobe nodule.  CT chest in May 2022 showed right upper lobe nodule measuring 1.4 cm which was also hypermetabolic on PET scan with an SUV of 3.3.  No other enlarged mediastinal or hilar lymph nodes or distant metastatic disease was noted.  Repeat CT scan in August 2022 showed mild increase in the size of the right upper lobe lung nodule to 1.8 cm.  No evidence of locoregional adenopathy.  Interval history-patient is at her baseline state of health.  No recent hospitalizations.  Denies any fevers cough or shortness of breath.  ECOG PS- 1 Pain scale- 0   Review of systems- Review of Systems  Constitutional:  Positive for malaise/fatigue. Negative for chills, fever and weight loss.  HENT:  Negative for congestion, ear discharge and nosebleeds.   Eyes:  Negative for blurred vision.  Respiratory:  Negative for cough, hemoptysis, sputum production, shortness of breath and wheezing.   Cardiovascular:  Negative for chest pain, palpitations, orthopnea and claudication.  Gastrointestinal:  Negative for abdominal pain, blood in stool, constipation, diarrhea, heartburn, melena, nausea and vomiting.  Genitourinary:  Negative for dysuria, flank pain,  frequency, hematuria and urgency.  Musculoskeletal:  Negative for back pain, joint pain and myalgias.  Skin:  Negative for rash.  Neurological:  Negative for dizziness, tingling, focal weakness, seizures, weakness and headaches.  Endo/Heme/Allergies:  Does not bruise/bleed easily.  Psychiatric/Behavioral:  Negative for depression and suicidal ideas. The patient does not have insomnia.      Allergies  Allergen Reactions   Adhesive [Tape] Other (See Comments)    Pt reports, when removing tape from skin most tapes pull her skin off. 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[place])    Vertigo      Past Surgical History:  Procedure Laterality Date   APPENDECTOMY N/A 02/24/2017   Procedure: Incidental  APPENDECTOMY;  Surgeon: [name], MD;  Location: [name];  Service: General;  Laterality: N/A;   CARPAL TUNNEL RELEASE Bilateral    CATARACT EXTRACTION W/PHACO Left 11/21/2018   Procedure: CATARACT EXTRACTION PHACO AND INTRAOCULAR LENS PLACEMENT ([place]) LEFT DIABETES;  Surgeon: Birder Robson, MD;  Location: [place];  Service: Ophthalmology;  Laterality: Left;  latex sensitivity Diabetic - oral meds   CATARACT EXTRACTION W/PHACO Right 12/12/2018   Procedure: CATARACT EXTRACTION PHACO  AND INTRAOCULAR LENS PLACEMENT ([place]) RIGHT DIABETES;  Surgeon: Birder Robson, MD;  Location: [place];  Service: Ophthalmology;  Laterality: Right;  Diabetes-oral med Latex sensitiviy   COLON RESECTION SIGMOID N/A 11/02/2016   Procedure: COLON RESECTION SIGMOID;  Surgeon: [name], MD;  Location: [name];  Service: General;  Laterality: N/A;   COLON SURGERY  11/02/2016   COLONOSCOPY WITH PROPOFOL N/A 02/03/2017   Procedure: COLONOSCOPY WITH PROPOFOL;  Surgeon: Lin Landsman, MD;  Location: [name];  Service: Gastroenterology;  Laterality: N/A;   COLOSTOMY N/A 11/02/2016   Procedure: COLOSTOMY;  Surgeon: [name], MD;  Location: [name];  Service: General;  Laterality: N/A;   COLOSTOMY REVERSAL N/A 02/24/2017   Procedure: COLOSTOMY REVERSAL, ostomy takedown, spleenic flexure mobilization, excision rectal stump/distal sigmoid, anastomosis with suture reinforcement;  Surgeon: [name], MD;  Location: [name];  Service: General;  Laterality: N/A;   ILEO LOOP DIVERSION N/A 02/24/2017   Procedure: ILEO LOOP COLOSTOMY;  Surgeon: [name], MD;  Location: [name];  Service: General;  Laterality: N/A;   ILEOSTOMY CLOSURE N/A 06/01/2017   Procedure: LOOP ILEOSTOMY TAKEDOWN;  Surgeon: [name], MD;  Location: [name];  Service: General;  Laterality: N/A;   IVC FILTER INSERTION Right 10/2016   IVC FILTER REMOVAL N/A 08/09/2017   Procedure: IVC FILTER REMOVAL;  Surgeon: Katha Cabal, MD;  Location: [place] [facility];  Service: Cardiovascular;  Laterality: N/A;   KNEE SURGERY     torn menicus   LAPAROSCOPY N/A 02/24/2017   Procedure: LAPAROSCOPY DIAGNOSTIC;  Surgeon: [name], MD;  Location: [name];  Service: General;  Laterality: N/A;   LYSIS OF ADHESION N/A 02/24/2017   Procedure: LYSIS OF ADHESION;  Surgeon: [name], MD;  Location: [name];  Service: General;  Laterality: N/A;   PULMONARY VENOGRAPHY N/A 11/19/2016    Procedure: Pulmonary Venography; IVC filter placement; possible pulmonary thrombectomy;  Surgeon: Katha Cabal, MD;  Location: [place] [facility];  Service: Cardiovascular;  Laterality: N/A;   SACROPLASTY N/A 11/08/2017   Procedure: SACROPLASTY S2;  Surgeon: Hessie Knows, MD;  Location: [name];  Service: Orthopedics;  Laterality: N/A;   SIGMOIDOSCOPY N/A 11/13/2016   Procedure: endoscopic  flexible SIGMOIDOSCOPY;  Surgeon: Florene Glen, MD;  Location: [name];  Service: General;  Laterality: N/A;   SIGMOIDOSCOPY N/A 05/19/2017   Procedure: Beryle Quant;  Surgeon: [name], MD;  Location: [name];  Service: General;  Laterality: N/A;    Social History   Socioeconomic History   Marital status: Married    Spouse name: Not on file   Number of children: Not on file   Years of education: Not on file   Highest education level: Not on file  Occupational History   Not on file  Tobacco Use   Smoking status: Never   Smokeless tobacco: Never  Vaping Use   Vaping Use: Never used  Substance and Sexual Activity   Alcohol use: No    Alcohol/week: 0.0 standard drinks   Drug use: No  Sexual activity: Never  Other Topics Concern   Not on file  Social History Narrative   Not on file   Social Determinants of Health   Financial Resource Strain: Not on file  Food Insecurity: Not on file  Transportation Needs: Not on file  Physical Activity: Not on file  Stress: Not on file  Social Connections: Not on file  Intimate Partner Violence: Not on file    Family History  Problem Relation Age of Onset   Diabetes Mother    Heart disease Mother    Cancer Mother    Breast cancer Mother        >50   Heart disease Father    Diabetes Sister    Heart disease Sister      Current Outpatient Medications:    alendronate (FOSAMAX) 70 MG tablet, Take 70 mg by mouth once a week. Take with a full glass of water on an empty stomach., Disp: , Rfl:    aspirin EC 81 MG  tablet, Take 81 mg by mouth daily. am, Disp: , Rfl:    calcium carbonate (CALCIUM 600) 600 MG TABS tablet, Take 1 tablet (600 mg total) by mouth 2 (two) times daily with a meal., Disp: 60 tablet, Rfl: 5   candesartan (ATACAND) 16 MG tablet, Take 16 mg by mouth daily. am, Disp: , Rfl:    cholecalciferol (VITAMIN D) 25 MCG (1000 UNIT) tablet, Take 1,000 Units by mouth daily., Disp: , Rfl:    cyclobenzaprine (FLEXERIL) 10 MG tablet, Take 10 mg by mouth at bedtime., Disp: , Rfl:    Dulaglutide 1.5 MG/0.5ML SOPN, Inject 1.5 mg into the skin once a week., Disp: , Rfl:    gabapentin (NEURONTIN) 100 MG capsule, Take 100 mg by mouth 2 (two) times daily., Disp: , Rfl:    gabapentin (NEURONTIN) 300 MG capsule, Take 300 mg by mouth at bedtime., Disp: , Rfl:    glipiZIDE (GLUCOTROL XL) 5 MG 24 hr tablet, Take 15 mg by mouth daily with breakfast. , Disp: , Rfl:    HYDROcodone-acetaminophen (NORCO/VICODIN) 5-325 MG tablet, Take 1 tablet by mouth at bedtime as needed for moderate pain. Must last 30 days., Disp: 15 tablet, Rfl: 0   hydrocortisone 2.5 % cream, Apply topically., Disp: , Rfl:    meclizine (ANTIVERT) 25 MG tablet, Take 25 mg by mouth daily. am, Disp: , Rfl:    Multiple Vitamin (MULTI-VITAMINS) TABS, Take 1 tablet by mouth daily., Disp: , Rfl:    nystatin cream (MYCOSTATIN), Apply 1 application topically 3 (three) times daily., Disp: , Rfl:    ondansetron (ZOFRAN ODT) 4 MG disintegrating tablet, Take 1 tablet (4 mg total) by mouth every 8 (eight) hours as needed for nausea or vomiting., Disp: 20 tablet, Rfl: 0   Oxycodone HCl 10 MG TABS, Take by mouth., Disp: , Rfl:    rOPINIRole (REQUIP) 0.5 MG tablet, Take 0.5 mg by mouth at bedtime., Disp: , Rfl:    tolterodine (DETROL) 2 MG tablet, Take 2 mg by mouth 2 (two) times daily., Disp: , Rfl:    traMADol (ULTRAM) 50 MG tablet, Take 1 tablet (50 mg total) by mouth every 8 (eight) hours as needed for severe pain. Each refill must last 30 days., Disp: 90  tablet, Rfl: 2   vitamin B-12 (CYANOCOBALAMIN) 500 MCG tablet, Take 500 mcg by mouth daily., Disp: , Rfl:    warfarin (COUMADIN) 6 MG tablet, Take 6 mg by mouth See admin instructions. Take 6 mg Monday- Friday,  take 7 mg Saturdays and Sunday, Disp: , Rfl:    glipiZIDE (GLUCOTROL) 10 MG tablet, Take 10 mg by mouth daily before breakfast. (Patient not taking: Reported on 02/06/2021), Disp: , Rfl:   Physical exam:  Vitals:   02/06/21 1423  BP: 127/67  Pulse: 95  Resp: 18  Temp: (!) 96.8 F (36 C)  SpO2: 94%  Weight: 208 lb 6.4 oz (94.5 kg)   Physical Exam HENT:     Head: Normocephalic.  Eyes:     Pupils: Pupils are equal, round, and reactive to light.  Cardiovascular:     Rate and Rhythm: Normal rate and regular rhythm.     Heart sounds: Normal heart sounds.  Pulmonary:     Effort: Pulmonary effort is normal.     Breath sounds: Normal breath sounds.  Abdominal:     General: Bowel sounds are normal.     Palpations: Abdomen is soft.  Skin:    General: Skin is warm and dry.  Neurological:     Mental Status: She is oriented to person, place, and time.     CMP Latest Ref Rng & Units 10/17/2020  Glucose 70 - 99 mg/dL -  BUN 8 - 23 mg/dL -  Creatinine 0.44 - 1.00 mg/dL 0.70  Sodium 135 - 145 mmol/L -  Potassium 3.5 - 5.1 mmol/L -  Chloride 98 - 111 mmol/L -  CO2 22 - 32 mmol/L -  Calcium 8.9 - 10.3 mg/dL -  Total Protein 6.5 - 8.1 g/dL -  Total Bilirubin 0.3 - 1.2 mg/dL -  Alkaline Phos 38 - 126 U/L -  AST 15 - 41 U/L -  ALT 0 - 44 U/L -   CBC Latest Ref Rng & Units 08/15/2020  WBC 4.0 - 10.5 K/uL 5.7  Hemoglobin 12.0 - 15.0 g/dL 13.2  Hematocrit 36.0 - 46.0 % 39.4  Platelets 150 - 400 K/uL 209    No images are attached to the encounter.  CT Chest Wo Contrast  Result Date: 02/05/2021 CLINICAL DATA:  Pulmonary nodule EXAM: CT CHEST WITHOUT CONTRAST TECHNIQUE: Multidetector CT imaging of the chest was performed following the standard protocol without IV contrast.  COMPARISON:  10/17/2020.  PET-CT 11/04/2020 FINDINGS: Cardiovascular: The heart size is normal. No substantial pericardial effusion. Mild atherosclerotic calcification is noted in the wall of the thoracic aorta. Mediastinum/Nodes: No mediastinal lymphadenopathy. No evidence for gross hilar lymphadenopathy although assessment is limited by the lack of intravenous contrast on the current study. The esophagus has normal imaging features. There is no axillary lymphadenopathy. Lungs/Pleura: Posterior right upper lobe pulmonary nodule is enlarged, measuring 1.8 x 1.7 cm today compared to 1.4 x 1.4 cm on 10/17/2020 chest CT. Lesion is contiguous with and retracts the major fissure over length of approximately 1.5 cm. No new suspicious pulmonary nodule or mass. No focal airspace consolidation. No pleural effusion. Upper Abdomen: Calcified gallstones evident. Musculoskeletal: No worrisome lytic or sclerotic osseous abnormality. IMPRESSION: 1. Interval increase in size of the posterior right upper lobe pulmonary nodule, now measuring 1.8 x 1.7 cm compared to 1.4 x 1.4 cm on 10/17/2020 chest CT. Nodule was noted to be hypermetabolic on previous PET-CT. Imaging features remain compatible with primary bronchogenic neoplasm. 2. Cholelithiasis. 3. Aortic Atherosclerosis (ICD10-I70.0). Electronically Signed   By: Misty Stanley M.D.   On: 02/05/2021 10:49     Assessment and plan- Patient is a 70 y.o. female with solitary right upper lobe lung nodule concerning for malignancy  We discussed patient's case  at the tumor board yesterday.  There has been a slow growth in the size of her right upper lobe lung nodule from 1.4 cm to 1.8 cm.  No evidence of locoregional adenopathy.  This was also hypermetabolic on PET scan 3 months prior.  Overall imaging findings are concerning for lung cancer.  We discussed the following options moving forward  Referral to pulmonary for bronchoscopy guided biopsy with Dr. Patsey Berthold told me was  amenable for biopsy.  Following tissue diagnosis get seen by radiation oncology for consideration for SBRT If patient absolutely does not wish to go for a biopsy then empiric SBRT is an option but less desirable. I would not recommend continued watchful monitoring at this time although patient will need surveillance scans every 4 to 6 months moving forward.  No role for systemic chemotherapy at this time  Patient has agreed to see radiation oncology but she was not quite decided about bronchoscopy.  Later I was informed that she is willing to see pulmonary for bronchoscopy and therefore I will make referral to both pulmonary and radiation oncology at this time  CT chest without contrast in 3 months and see me thereafter   Visit Diagnosis 1. Lung nodule      Dr. Randa Evens, MD, MPH [facility] at [facility] [phone] 02/07/2021 8:42 AM

## 2021-02-12 ENCOUNTER — Encounter: Payer: Self-pay | Admitting: Pulmonary Disease

## 2021-02-12 ENCOUNTER — Other Ambulatory Visit: Payer: Self-pay

## 2021-02-12 ENCOUNTER — Ambulatory Visit (INDEPENDENT_AMBULATORY_CARE_PROVIDER_SITE_OTHER): Payer: PPO | Admitting: Pulmonary Disease

## 2021-02-12 ENCOUNTER — Encounter: Payer: Self-pay | Admitting: *Deleted

## 2021-02-12 ENCOUNTER — Ambulatory Visit
Admission: RE | Admit: 2021-02-12 | Discharge: 2021-02-12 | Disposition: A | Discharge status: Home / Self Care | Payer: PPO | Source: Ambulatory Visit | Attending: Radiation Oncology | Admitting: Radiation Oncology

## 2021-02-12 ENCOUNTER — Telehealth: Payer: Self-pay | Admitting: Pulmonary Disease

## 2021-02-12 VITALS — BP 127/60 | HR 95

## 2021-02-12 VITALS — BP 95/50 | HR 96 | Temp 97.6°F | Resp 16 | Wt 208.7 lb

## 2021-02-12 DIAGNOSIS — M810 Age-related osteoporosis without current pathological fracture: Secondary | ICD-10-CM | POA: Diagnosis not present

## 2021-02-12 DIAGNOSIS — R911 Solitary pulmonary nodule: Secondary | ICD-10-CM

## 2021-02-12 DIAGNOSIS — Z91040 Latex allergy status: Secondary | ICD-10-CM | POA: Diagnosis not present

## 2021-02-12 DIAGNOSIS — M9901 Segmental and somatic dysfunction of cervical region: Secondary | ICD-10-CM | POA: Diagnosis not present

## 2021-02-12 DIAGNOSIS — E119 Type 2 diabetes mellitus without complications: Secondary | ICD-10-CM | POA: Insufficient documentation

## 2021-02-12 DIAGNOSIS — M531 Cervicobrachial syndrome: Secondary | ICD-10-CM | POA: Diagnosis not present

## 2021-02-12 DIAGNOSIS — I509 Heart failure, unspecified: Secondary | ICD-10-CM | POA: Diagnosis not present

## 2021-02-12 DIAGNOSIS — Z7901 Long term (current) use of anticoagulants: Secondary | ICD-10-CM

## 2021-02-12 DIAGNOSIS — R918 Other nonspecific abnormal finding of lung field: Secondary | ICD-10-CM | POA: Insufficient documentation

## 2021-02-12 DIAGNOSIS — C3411 Malignant neoplasm of upper lobe, right bronchus or lung: Secondary | ICD-10-CM | POA: Diagnosis not present

## 2021-02-12 DIAGNOSIS — I110 Hypertensive heart disease with heart failure: Secondary | ICD-10-CM | POA: Diagnosis not present

## 2021-02-12 DIAGNOSIS — Z7984 Long term (current) use of oral hypoglycemic drugs: Secondary | ICD-10-CM | POA: Insufficient documentation

## 2021-02-12 DIAGNOSIS — Z79899 Other long term (current) drug therapy: Secondary | ICD-10-CM | POA: Insufficient documentation

## 2021-02-12 DIAGNOSIS — M9902 Segmental and somatic dysfunction of thoracic region: Secondary | ICD-10-CM | POA: Diagnosis not present

## 2021-02-12 DIAGNOSIS — R42 Dizziness and giddiness: Secondary | ICD-10-CM | POA: Insufficient documentation

## 2021-02-12 DIAGNOSIS — Z7982 Long term (current) use of aspirin: Secondary | ICD-10-CM | POA: Insufficient documentation

## 2021-02-12 DIAGNOSIS — Z86711 Personal history of pulmonary embolism: Secondary | ICD-10-CM | POA: Insufficient documentation

## 2021-02-12 NOTE — Telephone Encounter (Signed)
Patient has been scheduled for robotic bronchoscopy with navigation and cellizio. AFO:[id number] AF:[id number] nodule.   Kara [name], please see bronch info.

## 2021-02-12 NOTE — H&P (View-Only) (Signed)
Subjective:    Patient ID: Kara [name], female    DOB: 1950-06-26, 70 y.o.   MRN: [id number] Chief Complaint  Patient presents with   Lung Lesion    Incidental lung nodule noted at lung.   HPI The patient is a 70 year old lifelong never smoker with prior history of PE on Coumadin, who presents for evaluation of a right upper lobe nodule.  She is kindly referred by Dr. Randa Evens.  The nodule was seen initially and incidentally on 24 March during an abdomen and pelvis CT that was performed for evaluation of abdominal pain and diarrhea.  She was noted at that time to have gastroenteritis related to COVID-19 infection.  The nodule was incompletely visualized due to the nature of the CT.  It appeared to course along the fissure on the right.  It appeared to measure 1.3 x 1.1 cm at that time.  Dedicated chest CT was recommended.  Patient underwent dedicated chest CT on 17 Oct 2020.  At that time the nodule appeared to measure 14 mm in mean diameter along the major fissure on the right.  Study was followed with a PET/CT on 04 November 2020 showed that this nodule had an SUV max of 3.3 and it is a right upper lobe nodule.  No other areas of hypermetabolic activity were noted.  No evidence of mediastinal adenopathy.  At that time opted to have the nodule observed.  And 61-month follow-up CT on 04 February 2021 this showed a right upper lobe pulmonary nodule measuring 1.8 x 1.7 cm compared to prior.  The patient has been referred for evaluation for potential robotic bronchoscopy.  The patient has been asymptomatic with regards to this nodule.  She has not had any issues with chest pain, tachypalpitations, shortness of breath, orthopnea or paroxysmal nocturnal dyspnea.  No lower extremity edema.  No calf tenderness.  She does have chronic back pain.  She is still employed and works as a Land for 2 infirmed individuals.  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  This was in 2018.    Review of Systems A 10 point review of systems was performed and it is as noted above otherwise negative.  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[place])    Vertigo    Past Surgical History:  Procedure Laterality Date   APPENDECTOMY N/A 02/24/2017   Procedure: Incidental  APPENDECTOMY;  Surgeon: [name], MD;  Location: [name];  Service: General;  Laterality: N/A;   CARPAL TUNNEL RELEASE Bilateral    CATARACT EXTRACTION W/PHACO Left 11/21/2018   Procedure: CATARACT EXTRACTION PHACO AND INTRAOCULAR LENS PLACEMENT ([place]) LEFT DIABETES;  Surgeon: Birder Robson, MD;  Location: [facility];  Service: Ophthalmology;  Laterality: Left;  latex sensitivity Diabetic - oral meds  CATARACT EXTRACTION W/PHACO Right 12/12/2018   Procedure: CATARACT EXTRACTION PHACO AND INTRAOCULAR LENS PLACEMENT ([place]) RIGHT DIABETES;  Surgeon: Birder Robson, MD;  Location: [facility];  Service: Ophthalmology;  Laterality: Right;  Diabetes-oral med Latex sensitiviy   COLON RESECTION SIGMOID N/A 11/02/2016   Procedure: COLON RESECTION SIGMOID;  Surgeon: [name], MD;  Location: [name];  Service: General;  Laterality: N/A;   COLON SURGERY  11/02/2016   COLONOSCOPY WITH PROPOFOL N/A 02/03/2017   Procedure: COLONOSCOPY WITH PROPOFOL;  Surgeon: Lin Landsman, MD;  Location: [facility]COPY;  Service:  Gastroenterology;  Laterality: N/A;   COLOSTOMY N/A 11/02/2016   Procedure: COLOSTOMY;  Surgeon: [name], MD;  Location: [name];  Service: General;  Laterality: N/A;   COLOSTOMY REVERSAL N/A 02/24/2017   Procedure: COLOSTOMY REVERSAL, ostomy takedown, spleenic flexure mobilization, excision rectal stump/distal sigmoid, anastomosis with suture reinforcement;  Surgeon: [name], MD;  Location: [name];  Service: General;  Laterality: N/A;   ILEO LOOP DIVERSION N/A 02/24/2017   Procedure: ILEO LOOP COLOSTOMY;  Surgeon: [name], MD;  Location: [name];  Service: General;  Laterality: N/A;   ILEOSTOMY CLOSURE N/A 06/01/2017   Procedure: LOOP ILEOSTOMY TAKEDOWN;  Surgeon: [name], MD;  Location: [name];  Service: General;  Laterality: N/A;   IVC FILTER INSERTION Right 10/2016   IVC FILTER REMOVAL N/A 08/09/2017   Procedure: IVC FILTER REMOVAL;  Surgeon: Katha Cabal, MD;  Location: [facility];  Service: Cardiovascular;  Laterality: N/A;   KNEE SURGERY     torn menicus   LAPAROSCOPY N/A 02/24/2017   Procedure: LAPAROSCOPY DIAGNOSTIC;  Surgeon: [name], MD;  Location: [name];  Service: General;  Laterality: N/A;   LYSIS OF ADHESION N/A 02/24/2017   Procedure: LYSIS OF ADHESION;  Surgeon: [name], MD;  Location: [name];  Service: General;  Laterality: N/A;   PULMONARY VENOGRAPHY N/A 11/19/2016   Procedure: Pulmonary Venography; IVC filter placement; possible pulmonary thrombectomy;  Surgeon: Katha Cabal, MD;  Location: [place] [facility];  Service: Cardiovascular;  Laterality: N/A;   SACROPLASTY N/A 11/08/2017   Procedure: SACROPLASTY S2;  Surgeon: Hessie Knows, MD;  Location: [name];  Service: Orthopedics;  Laterality: N/A;   SIGMOIDOSCOPY N/A 11/13/2016   Procedure: endoscopic  flexible SIGMOIDOSCOPY;  Surgeon: Florene Glen, MD;  Location: [name];  Service: General;  Laterality: N/A;   SIGMOIDOSCOPY N/A 05/19/2017    Procedure: FLEXIBLE SIGMOIDOSCOPY;  Surgeon: [name], MD;  Location: [name];  Service: General;  Laterality: N/A;   Family History  Problem Relation Age of Onset   Diabetes Mother    Heart disease Mother    Cancer Mother    Breast cancer Mother        >50   Heart disease Father    Diabetes Sister    Heart disease Sister    Social History   Tobacco Use   Smoking status: Never   Smokeless tobacco: Never  Substance Use Topics   Alcohol use: No    Alcohol/week: 0.0 standard drinks   Allergies  Allergen Reactions   Adhesive [Tape] Other (See Comments)    Pt reports, when removing tape from skin most tapes pull her skin off. Ok to use paper tape   Other Hives    Chlorhexadine wipes/CHG wipes,    Latex Rash    Exam gloves/ does not react around elastic and lips don't swell when blowing balloons   Morphine And Related Nausea And Vomiting  Current Meds  Medication Sig   alendronate (FOSAMAX) 70 MG tablet Take 70 mg by mouth once a week. Take with a full glass of water on an empty stomach.   aspirin EC 81 MG tablet Take 81 mg by mouth daily. am   calcium carbonate (CALCIUM 600) 600 MG TABS tablet Take 1 tablet (600 mg total) by mouth 2 (two) times daily with a meal.   candesartan (ATACAND) 16 MG tablet Take 16 mg by mouth daily. am   cholecalciferol (VITAMIN D) 25 MCG (1000 UNIT) tablet Take 1,000 Units by mouth daily.   cyclobenzaprine (FLEXERIL) 10 MG tablet Take 10 mg by mouth at bedtime.   Dulaglutide 1.5 MG/0.5ML SOPN Inject 1.5 mg into the skin once a week.   gabapentin (NEURONTIN) 100 MG capsule Take 100 mg by mouth 2 (two) times daily.   gabapentin (NEURONTIN) 300 MG capsule Take 300 mg by mouth at bedtime.   glipiZIDE (GLUCOTROL XL) 5 MG 24 hr tablet Take 15 mg by mouth daily with breakfast.    glipiZIDE (GLUCOTROL) 10 MG tablet Take 10 mg by mouth daily before breakfast.   hydrocortisone 2.5 % cream Apply topically.   meclizine (ANTIVERT) 25 MG tablet Take  25 mg by mouth daily. am   Multiple Vitamin (MULTI-VITAMINS) TABS Take 1 tablet by mouth daily.   nystatin cream (MYCOSTATIN) Apply 1 application topically 3 (three) times daily.   ondansetron (ZOFRAN ODT) 4 MG disintegrating tablet Take 1 tablet (4 mg total) by mouth every 8 (eight) hours as needed for nausea or vomiting.   Oxycodone HCl 10 MG TABS Take by mouth.   rOPINIRole (REQUIP) 0.5 MG tablet Take 0.5 mg by mouth at bedtime.   tolterodine (DETROL) 2 MG tablet Take 2 mg by mouth 2 (two) times daily.   traMADol (ULTRAM) 50 MG tablet Take 1 tablet (50 mg total) by mouth every 8 (eight) hours as needed for severe pain. Each refill must last 30 days.   vitamin B-12 (CYANOCOBALAMIN) 500 MCG tablet Take 500 mcg by mouth daily.   warfarin (COUMADIN) 6 MG tablet Take 6 mg by mouth See admin instructions. Take 6 mg Monday- Friday, take 7 mg Saturdays and Sunday   Immunization History  Administered Date(s) Administered   Influenza, High Dose Seasonal PF 03/01/2017   PFIZER(Purple Top)SARS-COV-2 Vaccination 08/20/2019, 09/12/2019   Pneumococcal Polysaccharide-23 09/30/2016      Objective:   Physical Exam BP 127/60 (BP Location: Left Arm, Cuff Size: Normal)   Pulse 95   SpO2 94%  GENERAL: This woman, no acute distress, presents chair due to back pain.  No conversational dyspnea. HEAD: Normocephalic, atraumatic.  EYES: Pupils equal, round, reactive to light.  No scleral icterus.  MOUTH: Nose/mouth/throat not examined due to masking requirements for COVID 19. NECK: Supple. No thyromegaly. Trachea midline. No JVD.  No adenopathy. PULMONARY: Good air entry bilaterally.  Symmetrical air entry.  No adventitious sounds. CARDIOVASCULAR: S1 and S2. Regular rate and rhythm.  No rubs, murmurs or gallops heard. ABDOMEN: Obese, otherwise benign. MUSCULOSKELETAL: No joint deformity, no clubbing, no edema.  NEUROLOGIC: No focal deficit, gait not tested, speech is fluent. SKIN: Intact,warm,dry.  Multiple  ecchymotic areas on the upper extremities. PSYCH: Mood and behavior normal.   Representative images from CT scan performed 04 February 2021, independently reviewed:           Assessment & Plan:     ICD-10-CM   1. Incidental lung nodule, RUL 1.8 x 1.7 cm   R91.1  Nodule with FDG avid with SUV 3.3 Highly suspicious for primary lung carcinoma Recommend biopsy with robotic bronchoscopy Procedure on 23 February 2021    2. Chronic anticoagulation (COUMADIN)  Z79.01    Will need to hold Coumadin prior to procedure    3. Latex precautions, history of latex allergy  Z91.040    This issue adds complexity to her management Avoid latex products     The patient has a nodule highly suspicious for primary lung carcinoma.  Initially was reluctant to have this biopsy but now wants biopsy prior to any therapy.  This is reasonable.  We will set her up for robotic bronchoscopy.  She understands that she will need to withhold Coumadin and a protocol will be given to her for this by the clinic who manages her Coumadin.  She will need Coumadin held at least 5 days prior to the procedure.  Procedure is scheduled for 3 October 12:30 PM.  A [facility] protocol CT has been ordered to develop an airway map to the lesion.  Benefits, limitations and potential complications of the procedure were discussed with the patient/family.  Complications from bronchoscopy are rare and most often minor, but if they occur they may include breathing difficulty, vocal cord spasm, hoarseness, slight fever, vomiting, dizziness, bronchospasm, infection, low blood oxygen, bleeding from biopsy site, or an allergic reaction to medications.  It is uncommon for patients to experience other more serious complications for example: Collapsed lung requiring chest tube placement, respiratory failure, heart attack and/or cardiac arrhythmia.  Patient agrees to proceed.  We will schedule follow-up after the procedure.  Renold Don,  MD Advanced Bronchoscopy PCCM [facility]    *This note was dictated using voice recognition software/Dragon.  Despite best efforts to proofread, errors can occur which can change the meaning.  Any change was purely unintentional.

## 2021-02-12 NOTE — Progress Notes (Addendum)
Subjective:    Patient ID: Kara [name], female    DOB: January 11, 1951, 70 y.o.   MRN: [id number] Chief Complaint  Patient presents with   Lung Lesion    Incidental lung nodule noted at lung.   HPI The patient is a 70 year old lifelong never smoker with prior history of PE on Coumadin, who presents for evaluation of a right upper lobe nodule.  She is kindly referred by Dr. Randa Evens.  The nodule was seen initially and incidentally on 24 March during an abdomen and pelvis CT that was performed for evaluation of abdominal pain and diarrhea.  She was noted at that time to have gastroenteritis related to COVID-19 infection.  The nodule was incompletely visualized due to the nature of the CT.  It appeared to course along the fissure on the right.  It appeared to measure 1.3 x 1.1 cm at that time.  Dedicated chest CT was recommended.  Patient underwent dedicated chest CT on 17 Oct 2020.  At that time the nodule appeared to measure 14 mm in mean diameter along the major fissure on the right.  Study was followed with a PET/CT on 04 November 2020 showed that this nodule had an SUV max of 3.3 and it is a right upper lobe nodule.  No other areas of hypermetabolic activity were noted.  No evidence of mediastinal adenopathy.  At that time opted to have the nodule observed.  And 77-month follow-up CT on 04 February 2021 this showed a right upper lobe pulmonary nodule measuring 1.8 x 1.7 cm compared to prior.  The patient has been referred for evaluation for potential robotic bronchoscopy.  The patient has been asymptomatic with regards to this nodule.  She has not had any issues with chest pain, tachypalpitations, shortness of breath, orthopnea or paroxysmal nocturnal dyspnea.  No lower extremity edema.  No calf tenderness.  She does have chronic back pain.  She is still employed and works as a Land for 2 infirmed individuals.  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  This was in 2018.    Review of Systems A 10 point review of systems was performed and it is as noted above otherwise negative.  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[place])    Vertigo    Past Surgical History:  Procedure Laterality Date   APPENDECTOMY N/A 02/24/2017   Procedure: Incidental  APPENDECTOMY;  Surgeon: [name], MD;  Location: [name];  Service: General;  Laterality: N/A;   CARPAL TUNNEL RELEASE Bilateral    CATARACT EXTRACTION W/PHACO Left 11/21/2018   Procedure: CATARACT EXTRACTION PHACO AND INTRAOCULAR LENS PLACEMENT ([place]) LEFT DIABETES;  Surgeon: Birder Robson, MD;  Location: [facility];  Service: Ophthalmology;  Laterality: Left;  latex sensitivity Diabetic - oral meds  CATARACT EXTRACTION W/PHACO Right 12/12/2018   Procedure: CATARACT EXTRACTION PHACO AND INTRAOCULAR LENS PLACEMENT ([place]) RIGHT DIABETES;  Surgeon: Birder Robson, MD;  Location: [facility];  Service: Ophthalmology;  Laterality: Right;  Diabetes-oral med Latex sensitiviy   COLON RESECTION SIGMOID N/A 11/02/2016   Procedure: COLON RESECTION SIGMOID;  Surgeon: [name], MD;  Location: [name];  Service: General;  Laterality: N/A;   COLON SURGERY  11/02/2016   COLONOSCOPY WITH PROPOFOL N/A 02/03/2017   Procedure: COLONOSCOPY WITH PROPOFOL;  Surgeon: Lin Landsman, MD;  [facility];  Service:  Gastroenterology;  Laterality: N/A;   COLOSTOMY N/A 11/02/2016   Procedure: COLOSTOMY;  Surgeon: [name], MD;  Location: [name];  Service: General;  Laterality: N/A;   COLOSTOMY REVERSAL N/A 02/24/2017   Procedure: COLOSTOMY REVERSAL, ostomy takedown, spleenic flexure mobilization, excision rectal stump/distal sigmoid, anastomosis with suture reinforcement;  Surgeon: [name], MD;  Location: [name];  Service: General;  Laterality: N/A;   ILEO LOOP DIVERSION N/A 02/24/2017   Procedure: ILEO LOOP COLOSTOMY;  Surgeon: [name], MD;  Location: [name];  Service: General;  Laterality: N/A;   ILEOSTOMY CLOSURE N/A 06/01/2017   Procedure: LOOP ILEOSTOMY TAKEDOWN;  Surgeon: [name], MD;  Location: [name];  Service: General;  Laterality: N/A;   IVC FILTER INSERTION Right 10/2016   IVC FILTER REMOVAL N/A 08/09/2017   Procedure: IVC FILTER REMOVAL;  Surgeon: Katha Cabal, MD;  Location: [facility];  Service: Cardiovascular;  Laterality: N/A;   KNEE SURGERY     torn menicus   LAPAROSCOPY N/A 02/24/2017   Procedure: LAPAROSCOPY DIAGNOSTIC;  Surgeon: [name], MD;  Location: [name];  Service: General;  Laterality: N/A;   LYSIS OF ADHESION N/A 02/24/2017   Procedure: LYSIS OF ADHESION;  Surgeon: [name], MD;  Location: [name];  Service: General;  Laterality: N/A;   PULMONARY VENOGRAPHY N/A 11/19/2016   Procedure: Pulmonary Venography; IVC filter placement; possible pulmonary thrombectomy;  Surgeon: Katha Cabal, MD;  Location: [facility];  Service: Cardiovascular;  Laterality: N/A;   SACROPLASTY N/A 11/08/2017   Procedure: SACROPLASTY S2;  Surgeon: Hessie Knows, MD;  Location: [name];  Service: Orthopedics;  Laterality: N/A;   SIGMOIDOSCOPY N/A 11/13/2016   Procedure: endoscopic  flexible SIGMOIDOSCOPY;  Surgeon: Florene Glen, MD;  Location: [name];  Service: General;  Laterality: N/A;   SIGMOIDOSCOPY N/A 05/19/2017    Procedure: FLEXIBLE SIGMOIDOSCOPY;  Surgeon: [name], MD;  Location: [name];  Service: General;  Laterality: N/A;   Family History  Problem Relation Age of Onset   Diabetes Mother    Heart disease Mother    Cancer Mother    Breast cancer Mother        >50   Heart disease Father    Diabetes Sister    Heart disease Sister    Social History   Tobacco Use   Smoking status: Never   Smokeless tobacco: Never  Substance Use Topics   Alcohol use: No    Alcohol/week: 0.0 standard drinks   Allergies  Allergen Reactions   Adhesive [Tape] Other (See Comments)    Pt reports, when removing tape from skin most tapes pull her skin off. Ok to use paper tape   Other Hives    Chlorhexadine wipes/CHG wipes,    Latex Rash    Exam gloves/ does not react around elastic and lips don't swell when blowing balloons   Morphine And Related Nausea And Vomiting  Current Meds  Medication Sig   alendronate (FOSAMAX) 70 MG tablet Take 70 mg by mouth once a week. Take with a full glass of water on an empty stomach.   aspirin EC 81 MG tablet Take 81 mg by mouth daily. am   calcium carbonate (CALCIUM 600) 600 MG TABS tablet Take 1 tablet (600 mg total) by mouth 2 (two) times daily with a meal.   candesartan (ATACAND) 16 MG tablet Take 16 mg by mouth daily. am   cholecalciferol (VITAMIN D) 25 MCG (1000 UNIT) tablet Take 1,000 Units by mouth daily.   cyclobenzaprine (FLEXERIL) 10 MG tablet Take 10 mg by mouth at bedtime.   Dulaglutide 1.5 MG/0.5ML SOPN Inject 1.5 mg into the skin once a week.   gabapentin (NEURONTIN) 100 MG capsule Take 100 mg by mouth 2 (two) times daily.   gabapentin (NEURONTIN) 300 MG capsule Take 300 mg by mouth at bedtime.   glipiZIDE (GLUCOTROL XL) 5 MG 24 hr tablet Take 15 mg by mouth daily with breakfast.    glipiZIDE (GLUCOTROL) 10 MG tablet Take 10 mg by mouth daily before breakfast.   hydrocortisone 2.5 % cream Apply topically.   meclizine (ANTIVERT) 25 MG tablet Take  25 mg by mouth daily. am   Multiple Vitamin (MULTI-VITAMINS) TABS Take 1 tablet by mouth daily.   nystatin cream (MYCOSTATIN) Apply 1 application topically 3 (three) times daily.   ondansetron (ZOFRAN ODT) 4 MG disintegrating tablet Take 1 tablet (4 mg total) by mouth every 8 (eight) hours as needed for nausea or vomiting.   Oxycodone HCl 10 MG TABS Take by mouth.   rOPINIRole (REQUIP) 0.5 MG tablet Take 0.5 mg by mouth at bedtime.   tolterodine (DETROL) 2 MG tablet Take 2 mg by mouth 2 (two) times daily.   traMADol (ULTRAM) 50 MG tablet Take 1 tablet (50 mg total) by mouth every 8 (eight) hours as needed for severe pain. Each refill must last 30 days.   vitamin B-12 (CYANOCOBALAMIN) 500 MCG tablet Take 500 mcg by mouth daily.   warfarin (COUMADIN) 6 MG tablet Take 6 mg by mouth See admin instructions. Take 6 mg Monday- Friday, take 7 mg Saturdays and Sunday   Immunization History  Administered Date(s) Administered   Influenza, High Dose Seasonal PF 03/01/2017   PFIZER(Purple Top)SARS-COV-2 Vaccination 08/20/2019, 09/12/2019   Pneumococcal Polysaccharide-23 09/30/2016      Objective:   Physical Exam BP 127/60 (BP Location: Left Arm, Cuff Size: Normal)   Pulse 95   SpO2 94%  GENERAL: This woman, no acute distress, presents chair due to back pain.  No conversational dyspnea. HEAD: Normocephalic, atraumatic.  EYES: Pupils equal, round, reactive to light.  No scleral icterus.  MOUTH: Nose/mouth/throat not examined due to masking requirements for COVID 19. NECK: Supple. No thyromegaly. Trachea midline. No JVD.  No adenopathy. PULMONARY: Good air entry bilaterally.  Symmetrical air entry.  No adventitious sounds. CARDIOVASCULAR: S1 and S2. Regular rate and rhythm.  No rubs, murmurs or gallops heard. ABDOMEN: Obese, otherwise benign. MUSCULOSKELETAL: No joint deformity, no clubbing, no edema.  NEUROLOGIC: No focal deficit, gait not tested, speech is fluent. SKIN: Intact,warm,dry.  Multiple  ecchymotic areas on the upper extremities. PSYCH: Mood and behavior normal.   Representative images from CT scan performed 04 February 2021, independently reviewed:           Assessment & Plan:     ICD-10-CM   1. Incidental lung nodule, RUL 1.8 x 1.7 cm   R91.1  Nodule with FDG avid with SUV 3.3 Highly suspicious for primary lung carcinoma Recommend biopsy with robotic bronchoscopy Procedure on 23 February 2021    2. Chronic anticoagulation (COUMADIN)  Z79.01    Will need to hold Coumadin prior to procedure    3. Latex precautions, history of latex allergy  Z91.040    This issue adds complexity to her management Avoid latex products     The patient has a nodule highly suspicious for primary lung carcinoma.  Initially was reluctant to have this biopsy but now wants biopsy prior to any therapy.  This is reasonable.  We will set her up for robotic bronchoscopy.  She understands that she will need to withhold Coumadin and a protocol will be given to her for this by the clinic who manages her Coumadin.  She will need Coumadin held at least 5 days prior to the procedure.  Procedure is scheduled for 3 October 12:30 PM.  A [facility] protocol CT has been ordered to develop an airway map to the lesion.  Benefits, limitations and potential complications of the procedure were discussed with the patient/family.  Complications from bronchoscopy are rare and most often minor, but if they occur they may include breathing difficulty, vocal cord spasm, hoarseness, slight fever, vomiting, dizziness, bronchospasm, infection, low blood oxygen, bleeding from biopsy site, or an allergic reaction to medications.  It is uncommon for patients to experience other more serious complications for example: Collapsed lung requiring chest tube placement, respiratory failure, heart attack and/or cardiac arrhythmia.  Patient agrees to proceed.  We will schedule follow-up after the procedure.  Renold Don,  MD Advanced Bronchoscopy PCCM [facility]    *This note was dictated using voice recognition software/Dragon.  Despite best efforts to proofread, errors can occur which can change the meaning.  Any change was purely unintentional.

## 2021-02-12 NOTE — Consult Note (Signed)
NEW PATIENT EVALUATION  Name: Kara [name]  MRN: [id number]  Date:   02/12/2021     DOB: 08-23-1950   This 69 y.o. female patient presents to the clinic for initial evaluation of presumed stage T1 non-small cell lung cancer of the right middle lobe.  REFERRING PHYSICIAN: Tracie Harrier, MD  CHIEF COMPLAINT:  Chief Complaint  Patient presents with   Lung Cancer    INitial consultation    DIAGNOSIS: The encounter diagnosis was Lung nodule.   PREVIOUS INVESTIGATIONS:  CT scan and PET scan reviewed Clinical notes reviewed Patient to have robotic assisted bronchoscopy for tissue diagnosis  HPI: Patient is a 70 year old female who presented with an incidentally found right middle lobe nodule on an abdominal CT scan when she was admitted for COVID back in May 2022.  At that time there was a 1.3 x 1.1 cm right lower lobe nodule.  No other enlarged mediastinal or hilar adenopathy was identified.  On PET CT scan the 1.4 cm right upper lobe nodule was adjacent to the major fissure hypermetabolic and consistent with a small primary neoplasm.  No enlarged hypermetabolic mediastinal hilar nodes were noted.  Seems this tumor crosses the fissure between upper and lower lobes.  She is asymptomatic and non-smoker specifically denies cough hemoptysis or chest tightness she did state walking in today she was little short of breath although attributes that to her back pain.  She has been seen by Dr. Patsey Berthold and is planning robotic assisted bronchoscopy for tissue diagnosis.  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    PAST SURGICAL HISTORY:  Past Surgical History:  Procedure Laterality Date   APPENDECTOMY N/A 02/24/2017   Procedure: Incidental  APPENDECTOMY;  Surgeon: [name], MD;  Location: [name];  Service: General;  Laterality: N/A;   CARPAL TUNNEL RELEASE Bilateral    CATARACT EXTRACTION W/PHACO Left 11/21/2018   Procedure: CATARACT EXTRACTION PHACO AND INTRAOCULAR LENS PLACEMENT ([place]) LEFT DIABETES;  Surgeon: Birder Robson, MD;  Location: [facility];  Service: Ophthalmology;  Laterality: Left;  latex sensitivity Diabetic - oral meds   CATARACT EXTRACTION W/PHACO Right 12/12/2018   Procedure: CATARACT EXTRACTION PHACO AND INTRAOCULAR LENS PLACEMENT ([place]) RIGHT DIABETES;  Surgeon: Birder Robson, MD;  Location: [place];  Service: Ophthalmology;  Laterality: Right;  Diabetes-oral med Latex sensitiviy   COLON RESECTION SIGMOID N/A 11/02/2016   Procedure: COLON RESECTION SIGMOID;  Surgeon: [name], MD;  Location: [name];  Service: General;  Laterality: N/A;   COLON SURGERY  11/02/2016   COLONOSCOPY WITH PROPOFOL N/A 02/03/2017   Procedure: COLONOSCOPY WITH PROPOFOL;  Surgeon: Lin Landsman, MD;  Location: [facility];  Service: Gastroenterology;  Laterality: N/A;   COLOSTOMY N/A 11/02/2016   Procedure: COLOSTOMY;  Surgeon: [name], MD;  Location: [name];  Service: General;  Laterality: N/A;   COLOSTOMY REVERSAL N/A 02/24/2017   Procedure: COLOSTOMY REVERSAL, ostomy takedown, spleenic flexure mobilization, excision rectal stump/distal sigmoid, anastomosis with suture reinforcement;  Surgeon: [name], MD;  Location: [name];  Service: General;  Laterality: N/A;   ILEO LOOP DIVERSION N/A 02/24/2017   Procedure: ILEO LOOP COLOSTOMY;  Surgeon: Adonis Huguenin,  Juanda Crumble, MD;  Location: [name];  Service: General;  Laterality: N/A;   ILEOSTOMY CLOSURE N/A 06/01/2017   Procedure: LOOP  ILEOSTOMY TAKEDOWN;  Surgeon: [name], MD;  Location: [name];  Service: General;  Laterality: N/A;   IVC FILTER INSERTION Right 10/2016   IVC FILTER REMOVAL N/A 08/09/2017   Procedure: IVC FILTER REMOVAL;  Surgeon: Katha Cabal, MD;  Location: [place] [facility];  Service: Cardiovascular;  Laterality: N/A;   KNEE SURGERY     torn menicus   LAPAROSCOPY N/A 02/24/2017   Procedure: LAPAROSCOPY DIAGNOSTIC;  Surgeon: [name], MD;  Location: [name];  Service: General;  Laterality: N/A;   LYSIS OF ADHESION N/A 02/24/2017   Procedure: LYSIS OF ADHESION;  Surgeon: [name], MD;  Location: [name];  Service: General;  Laterality: N/A;   PULMONARY VENOGRAPHY N/A 11/19/2016   Procedure: Pulmonary Venography; IVC filter placement; possible pulmonary thrombectomy;  Surgeon: Katha Cabal, MD;  Location: [place] [facility];  Service: Cardiovascular;  Laterality: N/A;   SACROPLASTY N/A 11/08/2017   Procedure: SACROPLASTY S2;  Surgeon: Hessie Knows, MD;  Location: [name];  Service: Orthopedics;  Laterality: N/A;   SIGMOIDOSCOPY N/A 11/13/2016   Procedure: endoscopic  flexible SIGMOIDOSCOPY;  Surgeon: Florene Glen, MD;  Location: [name];  Service: General;  Laterality: N/A;   SIGMOIDOSCOPY N/A 05/19/2017   Procedure: Beryle Quant;  Surgeon: [name], MD;  Location: [name];  Service: General;  Laterality: N/A;    FAMILY HISTORY: family history includes Breast cancer in her mother; Cancer in her mother; Diabetes in her mother and sister; Heart disease in her father, mother, and sister.  SOCIAL HISTORY:  reports that she has never smoked. She has never used smokeless tobacco. She reports that she does not drink alcohol and does not use drugs.  ALLERGIES: Adhesive [tape], Other, Latex, and Morphine and related  MEDICATIONS:  Current Outpatient Medications  Medication Sig Dispense Refill   alendronate (FOSAMAX) 70 MG tablet Take 70 mg by  mouth once a week. Take with a full glass of water on an empty stomach.     aspirin EC 81 MG tablet Take 81 mg by mouth daily. am     calcium carbonate (CALCIUM 600) 600 MG TABS tablet Take 1 tablet (600 mg total) by mouth 2 (two) times daily with a meal. 60 tablet 5   candesartan (ATACAND) 16 MG tablet Take 16 mg by mouth daily. am     cholecalciferol (VITAMIN D) 25 MCG (1000 UNIT) tablet Take 1,000 Units by mouth daily.     cyclobenzaprine (FLEXERIL) 10 MG tablet Take 10 mg by mouth at bedtime.     Dulaglutide 1.5 MG/0.5ML SOPN Inject 1.5 mg into the skin once a week.     gabapentin (NEURONTIN) 100 MG capsule Take 100 mg by mouth 2 (two) times daily.     gabapentin (NEURONTIN) 300 MG capsule Take 300 mg by mouth at bedtime.     glipiZIDE (GLUCOTROL XL) 5 MG 24 hr tablet Take 15 mg by mouth daily with breakfast.      hydrocortisone 2.5 % cream Apply topically.     meclizine (ANTIVERT) 25 MG tablet Take 25 mg by mouth daily. am     Multiple Vitamin (MULTI-VITAMINS) TABS Take 1 tablet by mouth daily.     nystatin cream (MYCOSTATIN) Apply 1 application topically 3 (three) times daily.     ondansetron (ZOFRAN ODT) 4 MG disintegrating tablet Take 1 tablet (4 mg total)  by mouth every 8 (eight) hours as needed for nausea or vomiting. 20 tablet 0   Oxycodone HCl 10 MG TABS Take by mouth.     rOPINIRole (REQUIP) 0.5 MG tablet Take 0.5 mg by mouth at bedtime.     tolterodine (DETROL) 2 MG tablet Take 2 mg by mouth 2 (two) times daily.     traMADol (ULTRAM) 50 MG tablet Take 1 tablet (50 mg total) by mouth every 8 (eight) hours as needed for severe pain. Each refill must last 30 days. 90 tablet 2   vitamin B-12 (CYANOCOBALAMIN) 500 MCG tablet Take 500 mcg by mouth daily.     warfarin (COUMADIN) 6 MG tablet Take 6 mg by mouth See admin instructions. Take 6 mg Monday- Friday, take 7 mg Saturdays and Sunday     glipiZIDE (GLUCOTROL) 10 MG tablet Take 10 mg by mouth daily before breakfast. (Patient not  taking: No sig reported)     HYDROcodone-acetaminophen (NORCO/VICODIN) 5-325 MG tablet Take 1 tablet by mouth at bedtime as needed for moderate pain. Must last 30 days. 15 tablet 0   No current facility-administered medications for this encounter.    ECOG PERFORMANCE STATUS:  0 - Asymptomatic  REVIEW OF SYSTEMS: Patient denies any weight loss, fatigue, weakness, fever, chills or night sweats. Patient denies any loss of vision, blurred vision. Patient denies any ringing  of the ears or hearing loss. No irregular heartbeat. Patient denies heart murmur or history of fainting. Patient denies any chest pain or pain radiating to her upper extremities. Patient denies any shortness of breath, difficulty breathing at night, cough or hemoptysis. Patient denies any swelling in the lower legs. Patient denies any nausea vomiting, vomiting of blood, or coffee ground material in the vomitus. Patient denies any stomach pain. Patient states has had normal bowel movements no significant constipation or diarrhea. Patient denies any dysuria, hematuria or significant nocturia. Patient denies any problems walking, swelling in the joints or loss of balance. Patient denies any skin changes, loss of hair or loss of weight. Patient denies any excessive worrying or anxiety or significant depression. Patient denies any problems with insomnia. Patient denies excessive thirst, polyuria, polydipsia. Patient denies any swollen glands, patient denies easy bruising or easy bleeding. Patient denies any recent infections, allergies or URI. Patient "s visual fields have not changed significantly in recent time.   PHYSICAL EXAM: BP (!) 95/50 (BP Location: Left Arm, Patient Position: Sitting)   Pulse 96   Temp 97.6 F (36.4 C) (Tympanic)   Resp 16   Wt 208 lb 11.2 oz (94.7 kg)   BMI 36.97 kg/m  Wheelchair-bound female in NAD.  Well-developed well-nourished patient in NAD. HEENT reveals PERLA, EOMI, discs not visualized.  Oral cavity  is clear. No oral mucosal lesions are identified. Neck is clear without evidence of cervical or supraclavicular adenopathy. Lungs are clear to A&P. Cardiac examination is essentially unremarkable with regular rate and rhythm without murmur rub or thrill. Abdomen is benign with no organomegaly or masses noted. Motor sensory and DTR levels are equal and symmetric in the upper and lower extremities. Cranial nerves II through XII are grossly intact. Proprioception is intact. No peripheral adenopathy or edema is identified. No motor or sensory levels are noted. Crude visual fields are within normal range.  LABORATORY DATA: Pathology pending    RADIOLOGY RESULTS: CT and PET CT scans reviewed compatible with above-stated findings   IMPRESSION: Probable stage I non-small cell lung cancer of the right lung based on  CT and PET/CT findings and 70 year old female  PLAN: This time patient is agreed to bronchoscopy by Dr. Patsey Berthold.  I will see her back shortly after the bronchoscopy is performed for again discussion of treatment options.  Based on the fact the tumor crosses the may major fissure significant surgery would be needed in a patient with multiple medical comorbid comorbidities.  I have recommended SBRT 60 Gray in 5 fractions.  Very low side effect profile was reviewed with the patient.  We will see her back for further discussion once tissue diagnosis is established.  Patient comprehends my recommendations well.  I would like to take this opportunity to thank you for allowing me to participate in the care of your patient.Noreene Filbert, MD

## 2021-02-13 ENCOUNTER — Telehealth: Payer: Self-pay | Admitting: Pulmonary Disease

## 2021-02-13 DIAGNOSIS — Z23 Encounter for immunization: Secondary | ICD-10-CM | POA: Diagnosis not present

## 2021-02-13 DIAGNOSIS — M5136 Other intervertebral disc degeneration, lumbar region: Secondary | ICD-10-CM | POA: Diagnosis not present

## 2021-02-13 DIAGNOSIS — E1165 Type 2 diabetes mellitus with hyperglycemia: Secondary | ICD-10-CM | POA: Diagnosis not present

## 2021-02-13 DIAGNOSIS — Z7901 Long term (current) use of anticoagulants: Secondary | ICD-10-CM | POA: Diagnosis not present

## 2021-02-13 DIAGNOSIS — M25511 Pain in right shoulder: Secondary | ICD-10-CM | POA: Diagnosis not present

## 2021-02-13 DIAGNOSIS — M545 Low back pain, unspecified: Secondary | ICD-10-CM | POA: Diagnosis not present

## 2021-02-13 DIAGNOSIS — R911 Solitary pulmonary nodule: Secondary | ICD-10-CM | POA: Diagnosis not present

## 2021-02-13 DIAGNOSIS — I10 Essential (primary) hypertension: Secondary | ICD-10-CM | POA: Diagnosis not present

## 2021-02-13 DIAGNOSIS — G8929 Other chronic pain: Secondary | ICD-10-CM | POA: Diagnosis not present

## 2021-02-13 DIAGNOSIS — Z86711 Personal history of pulmonary embolism: Secondary | ICD-10-CM | POA: Diagnosis not present

## 2021-02-13 NOTE — Telephone Encounter (Signed)
Phone pre admit visit 02/18/2021 between 1-5 and covid test 02/19/2021.  Spoke to patient and she requested to call back as she is currently driving.

## 2021-02-13 NOTE — Telephone Encounter (Signed)
Patient is aware of below dates/times and voiced her understanding.  Nothing further needed at this time.

## 2021-02-13 NOTE — Telephone Encounter (Signed)
Prior Auth Not Required for the codes [phone], [phone] Refer # Caren Macadam 02/13/2021

## 2021-02-13 NOTE — Telephone Encounter (Signed)
accidental

## 2021-02-13 NOTE — Progress Notes (Signed)
Met with patient during initial consult with Dr. Baruch Gouty. All questions answered during visit. Reviewed upcoming appts. Contact info given and instructed to call with any questions or needs. Pt verbalized understanding.

## 2021-02-16 NOTE — Telephone Encounter (Signed)
Patient called in with additional questions. Patient is questioning if she can have water prior to procedure. She is aware that she can only have a small sip on water with her medication.  She voiced her understanding.  Nothing further needed.

## 2021-02-17 DIAGNOSIS — M9902 Segmental and somatic dysfunction of thoracic region: Secondary | ICD-10-CM | POA: Diagnosis not present

## 2021-02-17 DIAGNOSIS — M531 Cervicobrachial syndrome: Secondary | ICD-10-CM | POA: Diagnosis not present

## 2021-02-17 DIAGNOSIS — M9901 Segmental and somatic dysfunction of cervical region: Secondary | ICD-10-CM | POA: Diagnosis not present

## 2021-02-18 ENCOUNTER — Other Ambulatory Visit: Payer: Self-pay

## 2021-02-18 ENCOUNTER — Ambulatory Visit
Admission: RE | Admit: 2021-02-18 | Discharge: 2021-02-18 | Disposition: A | Discharge status: Home / Self Care | Payer: PPO | Source: Ambulatory Visit | Attending: Pulmonary Disease | Admitting: Pulmonary Disease

## 2021-02-18 ENCOUNTER — Encounter
Admission: RE | Admit: 2021-02-18 | Discharge: 2021-02-18 | Disposition: A | Discharge status: Home / Self Care | Payer: PPO | Source: Ambulatory Visit | Attending: Pulmonary Disease | Admitting: Pulmonary Disease

## 2021-02-18 DIAGNOSIS — R911 Solitary pulmonary nodule: Secondary | ICD-10-CM | POA: Insufficient documentation

## 2021-02-18 DIAGNOSIS — I700 Atherosclerosis of aorta: Secondary | ICD-10-CM | POA: Diagnosis not present

## 2021-02-18 NOTE — Patient Instructions (Addendum)
Your procedure is scheduled on: 02/23/2021 Report to the Registration Desk on the 1st floor of the [facility]. To find out your arrival time, please call [phone] between 1PM - 3PM on: 02/20/2021  REMEMBER: Instructions that are not followed completely may result in serious medical risk, up to and including death; or upon the discretion of your surgeon and anesthesiologist your surgery may need to be rescheduled.  Do not eat food after midnight the night before surgery.  No gum chewing, lozengers or hard candies.  Take these medications with a sip of water the morning of surgery: - gabapentin (NEURONTIN) 100 MG - HYDROcodone-acetaminophen (NORCO/VICODIN) 5-325 MG **IF NEEDED**  ** Do not take any coumadin after today 02/17/2021 per Dr. Patsey Berthold **  One week prior to surgery: Stop Anti-inflammatories (NSAIDS) such as Advil, Aleve, Ibuprofen, Motrin, Naproxen, Naprosyn and Aspirin based products such as Excedrin, Goodys Powder, BC Powder. You may however, continue to take Tylenol if needed for pain up until the day of surgery. Stop ANY OVER THE COUNTER vitamins and supplements until after surgery.   No Alcohol for 24 hours before or after surgery.  No Smoking including e-cigarettes for 24 hours prior to surgery.  No chewable tobacco products for at least 6 hours prior to surgery.  No nicotine patches on the day of surgery.  Do not use any "recreational" drugs for at least a week prior to your surgery.  Please be advised that the combination of cocaine and anesthesia may have negative outcomes, up to and including death. If you test positive for cocaine, your surgery will be cancelled.  On the morning of surgery brush your teeth with toothpaste and water, you may rinse your mouth with mouthwash if you wish. Do not swallow any toothpaste or mouthwash.  Do not wear jewelry, make-up, hairpins, clips or nail polish.  Do not wear lotions, powders, or perfumes.   Do not shave  body from the neck down 48 hours prior to surgery just in case you cut yourself which could leave a site for infection.  Also, freshly shaved skin may become irritated if using the CHG soap.  Contact lenses, hearing aids and dentures may not be worn into surgery.  Do not bring valuables to the hospital. [facility] is not responsible for any missing/lost belongings or valuables.   Notify your doctor if there is any change in your medical condition (cold, fever, infection).  Wear comfortable clothing (specific to your surgery type) to the hospital.  If you are being discharged the day of surgery, you will not be allowed to drive home. You will need a responsible adult (18 years or older) to drive you home and stay with you that night.   If you are taking public transportation, you will need to have a responsible adult (18 years or older) with you. Please confirm with your physician that it is acceptable to use public transportation.   Please call the [facility] at [phone] if you have any questions about these instructions.  Surgery Visitation Policy:  Patients undergoing a surgery or procedure may have one family member or support person with them as long as that person is not COVID-19 positive or experiencing its symptoms.  That person may remain in the waiting area during the procedure and may rotate out with other people.

## 2021-02-19 ENCOUNTER — Encounter: Payer: Self-pay | Admitting: Urgent Care

## 2021-02-19 ENCOUNTER — Other Ambulatory Visit
Admission: RE | Admit: 2021-02-19 | Discharge: 2021-02-19 | Disposition: A | Discharge status: Home / Self Care | Payer: PPO | Source: Ambulatory Visit | Attending: Pulmonary Disease | Admitting: Pulmonary Disease

## 2021-02-19 DIAGNOSIS — Z7901 Long term (current) use of anticoagulants: Secondary | ICD-10-CM | POA: Insufficient documentation

## 2021-02-19 DIAGNOSIS — Z20822 Contact with and (suspected) exposure to covid-19: Secondary | ICD-10-CM | POA: Diagnosis not present

## 2021-02-19 DIAGNOSIS — Z01818 Encounter for other preprocedural examination: Secondary | ICD-10-CM | POA: Diagnosis not present

## 2021-02-19 DIAGNOSIS — Z01810 Encounter for preprocedural cardiovascular examination: Secondary | ICD-10-CM | POA: Diagnosis not present

## 2021-02-19 LAB — COMPREHENSIVE METABOLIC PANEL
ALT: 27 U/L (ref 0–44)
AST: 23 U/L (ref 15–41)
Albumin: 3.6 g/dL (ref 3.5–5.0)
Alkaline Phosphatase: 43 U/L (ref 38–126)
Anion gap: 8 (ref 5–15)
BUN: 16 mg/dL (ref 8–23)
CO2: 25 mmol/L (ref 22–32)
Calcium: 9 mg/dL (ref 8.9–10.3)
Chloride: 106 mmol/L (ref 98–111)
Creatinine, Ser: 0.7 mg/dL (ref 0.44–1.00)
GFR, Estimated: >60 mL/min (ref >60)
Glucose, Bld: 150 mg/dL — ABNORMAL HIGH (ref 70–99)
Potassium: 3.5 mmol/L (ref 3.5–5.1)
Sodium: 139 mmol/L (ref 135–145)
Total Bilirubin: 0.8 mg/dL (ref 0.3–1.2)
Total Protein: 6.9 g/dL (ref 6.5–8.1)

## 2021-02-19 LAB — PROTIME-INR
INR: 1.8 — ABNORMAL HIGH (ref 0.8–1.2)
Prothrombin Time: 21.1 seconds — ABNORMAL HIGH (ref 11.4–15.2)

## 2021-02-19 LAB — CBC
HCT: 40.5 % (ref 36.0–46.0)
Hemoglobin: 13.2 g/dL (ref 12.0–15.0)
MCH: 28.8 pg (ref 26.0–34.0)
MCHC: 32.6 g/dL (ref 30.0–36.0)
MCV: 88.2 fL (ref 80.0–100.0)
Platelets: 249 10*3/uL (ref 150–400)
RBC: 4.59 MIL/uL (ref 3.87–5.11)
RDW: 14.5 % (ref 11.5–15.5)
WBC: 6.1 10*3/uL (ref 4.0–10.5)
nRBC: 0 % (ref 0.0–0.2)

## 2021-02-19 LAB — APTT: aPTT: 40 seconds — ABNORMAL HIGH (ref 24–36)

## 2021-02-20 LAB — SARS CORONAVIRUS 2 (TAT 6-24 HRS): SARS Coronavirus 2: NEGATIVE

## 2021-02-23 ENCOUNTER — Ambulatory Visit: Payer: PPO

## 2021-02-23 ENCOUNTER — Ambulatory Visit
Admission: RE | Admit: 2021-02-23 | Discharge: 2021-02-23 | Disposition: A | Discharge status: Home / Self Care | Payer: PPO | Attending: Pulmonary Disease | Admitting: Pulmonary Disease

## 2021-02-23 ENCOUNTER — Ambulatory Visit: Payer: PPO | Admitting: Certified Registered"

## 2021-02-23 ENCOUNTER — Encounter: Payer: Self-pay | Admitting: Pulmonary Disease

## 2021-02-23 ENCOUNTER — Encounter
Admission: RE | Disposition: A | Discharge status: Home / Self Care | Payer: Self-pay | Source: Home / Self Care | Attending: Pulmonary Disease

## 2021-02-23 ENCOUNTER — Other Ambulatory Visit: Payer: Self-pay

## 2021-02-23 DIAGNOSIS — E669 Obesity, unspecified: Secondary | ICD-10-CM | POA: Insufficient documentation

## 2021-02-23 DIAGNOSIS — Z91040 Latex allergy status: Secondary | ICD-10-CM | POA: Diagnosis not present

## 2021-02-23 DIAGNOSIS — I110 Hypertensive heart disease with heart failure: Secondary | ICD-10-CM | POA: Diagnosis not present

## 2021-02-23 DIAGNOSIS — C3411 Malignant neoplasm of upper lobe, right bronchus or lung: Secondary | ICD-10-CM | POA: Insufficient documentation

## 2021-02-23 DIAGNOSIS — R911 Solitary pulmonary nodule: Secondary | ICD-10-CM

## 2021-02-23 DIAGNOSIS — Z7982 Long term (current) use of aspirin: Secondary | ICD-10-CM | POA: Insufficient documentation

## 2021-02-23 DIAGNOSIS — Z86711 Personal history of pulmonary embolism: Secondary | ICD-10-CM | POA: Insufficient documentation

## 2021-02-23 DIAGNOSIS — Z79899 Other long term (current) drug therapy: Secondary | ICD-10-CM | POA: Insufficient documentation

## 2021-02-23 DIAGNOSIS — Z9109 Other allergy status, other than to drugs and biological substances: Secondary | ICD-10-CM | POA: Diagnosis not present

## 2021-02-23 DIAGNOSIS — Z87891 Personal history of nicotine dependence: Secondary | ICD-10-CM | POA: Insufficient documentation

## 2021-02-23 DIAGNOSIS — Z885 Allergy status to narcotic agent status: Secondary | ICD-10-CM | POA: Diagnosis not present

## 2021-02-23 DIAGNOSIS — Z7984 Long term (current) use of oral hypoglycemic drugs: Secondary | ICD-10-CM | POA: Diagnosis not present

## 2021-02-23 DIAGNOSIS — R918 Other nonspecific abnormal finding of lung field: Secondary | ICD-10-CM | POA: Diagnosis not present

## 2021-02-23 DIAGNOSIS — Z9889 Other specified postprocedural states: Secondary | ICD-10-CM

## 2021-02-23 DIAGNOSIS — Z6837 Body mass index (BMI) 37.0-37.9, adult: Secondary | ICD-10-CM | POA: Insufficient documentation

## 2021-02-23 DIAGNOSIS — Z7901 Long term (current) use of anticoagulants: Secondary | ICD-10-CM | POA: Diagnosis not present

## 2021-02-23 DIAGNOSIS — I509 Heart failure, unspecified: Secondary | ICD-10-CM | POA: Insufficient documentation

## 2021-02-23 DIAGNOSIS — E119 Type 2 diabetes mellitus without complications: Secondary | ICD-10-CM | POA: Diagnosis not present

## 2021-02-23 DIAGNOSIS — I517 Cardiomegaly: Secondary | ICD-10-CM | POA: Diagnosis not present

## 2021-02-23 DIAGNOSIS — K5730 Diverticulosis of large intestine without perforation or abscess without bleeding: Secondary | ICD-10-CM | POA: Diagnosis not present

## 2021-02-23 HISTORY — PX: VIDEO BRONCHOSCOPY WITH ENDOBRONCHIAL NAVIGATION: SHX6175

## 2021-02-23 LAB — GLUCOSE, CAPILLARY
Glucose-Capillary: 130 mg/dL — ABNORMAL HIGH (ref 70–99)
Glucose-Capillary: 132 mg/dL — ABNORMAL HIGH (ref 70–99)

## 2021-02-23 SURGERY — VIDEO BRONCHOSCOPY WITH ENDOBRONCHIAL NAVIGATION
Anesthesia: General

## 2021-02-23 MED ORDER — IPRATROPIUM-ALBUTEROL 0.5-2.5 (3) MG/3ML IN SOLN
RESPIRATORY_TRACT | Status: AC
  Filled 2021-02-23: qty 3

## 2021-02-23 MED ORDER — ROCURONIUM BROMIDE 100 MG/10ML IV SOLN
INTRAVENOUS | Status: DC | PRN
  Administered 2021-02-23: 40 mg via INTRAVENOUS
  Administered 2021-02-23 (×2): 20 mg via INTRAVENOUS

## 2021-02-23 MED ORDER — MIDAZOLAM HCL 2 MG/2ML IJ SOLN
INTRAMUSCULAR | Status: AC
  Filled 2021-02-23: qty 2

## 2021-02-23 MED ORDER — CHLORHEXIDINE GLUCONATE 0.12 % MT SOLN
15.0000 mL | Freq: Once | OROMUCOSAL | Status: AC
  Administered 2021-02-23: 15 mL via OROMUCOSAL

## 2021-02-23 MED ORDER — MIDAZOLAM HCL 2 MG/2ML IJ SOLN
INTRAMUSCULAR | Status: DC | PRN
  Administered 2021-02-23 (×2): 1 mg via INTRAVENOUS

## 2021-02-23 MED ORDER — PROPOFOL 10 MG/ML IV BOLUS
INTRAVENOUS | Status: DC | PRN
  Administered 2021-02-23: 150 mg via INTRAVENOUS

## 2021-02-23 MED ORDER — DEXMEDETOMIDINE HCL IN NACL 200 MCG/50ML IV SOLN
INTRAVENOUS | Status: DC | PRN
  Administered 2021-02-23 (×4): 4 ug via INTRAVENOUS

## 2021-02-23 MED ORDER — ALBUTEROL SULFATE (2.5 MG/3ML) 0.083% IN NEBU
INHALATION_SOLUTION | RESPIRATORY_TRACT | Status: AC
  Administered 2021-02-23: 2.5 mg via RESPIRATORY_TRACT
  Filled 2021-02-23: qty 3

## 2021-02-23 MED ORDER — LIDOCAINE HCL (CARDIAC) PF 100 MG/5ML IV SOSY
PREFILLED_SYRINGE | INTRAVENOUS | Status: DC | PRN
  Administered 2021-02-23: 60 mg via INTRAVENOUS

## 2021-02-23 MED ORDER — SUGAMMADEX SODIUM 200 MG/2ML IV SOLN
INTRAVENOUS | Status: DC | PRN
  Administered 2021-02-23: 200 mg via INTRAVENOUS

## 2021-02-23 MED ORDER — SODIUM CHLORIDE 0.9 % IV SOLN: Freq: Once | INTRAVENOUS | Status: DC

## 2021-02-23 MED ORDER — PROPOFOL 10 MG/ML IV BOLUS
INTRAVENOUS | Status: AC
  Filled 2021-02-23: qty 20

## 2021-02-23 MED ORDER — SODIUM CHLORIDE 0.9 % IV SOLN: INTRAVENOUS | Status: DC

## 2021-02-23 MED ORDER — PHENYLEPHRINE HCL (PRESSORS) 10 MG/ML IV SOLN
INTRAVENOUS | Status: AC
  Filled 2021-02-23: qty 1

## 2021-02-23 MED ORDER — ONDANSETRON HCL 4 MG/2ML IJ SOLN
INTRAMUSCULAR | Status: AC
  Filled 2021-02-23: qty 2

## 2021-02-23 MED ORDER — ONDANSETRON HCL 4 MG/2ML IJ SOLN
INTRAMUSCULAR | Status: DC | PRN
  Administered 2021-02-23: 4 mg via INTRAVENOUS

## 2021-02-23 MED ORDER — ALBUTEROL SULFATE (2.5 MG/3ML) 0.083% IN NEBU
2.5000 mg | INHALATION_SOLUTION | Freq: Once | RESPIRATORY_TRACT | Status: AC
Start: 2021-02-23 — End: 2021-02-23
  Filled 2021-02-23: qty 3

## 2021-02-23 MED ORDER — FAMOTIDINE 20 MG PO TABS: 20.0000 mg | ORAL_TABLET | Freq: Once | ORAL | Status: DC

## 2021-02-23 MED ORDER — CHLORHEXIDINE GLUCONATE 0.12 % MT SOLN
OROMUCOSAL | Status: AC
  Filled 2021-02-23: qty 15

## 2021-02-23 MED ORDER — FENTANYL CITRATE (PF) 100 MCG/2ML IJ SOLN
INTRAMUSCULAR | Status: DC | PRN
  Administered 2021-02-23: 100 ug via INTRAVENOUS
  Administered 2021-02-23 (×4): 50 ug via INTRAVENOUS

## 2021-02-23 MED ORDER — ROCURONIUM BROMIDE 10 MG/ML (PF) SYRINGE
PREFILLED_SYRINGE | INTRAVENOUS | Status: AC
  Filled 2021-02-23: qty 10

## 2021-02-23 MED ORDER — DEXAMETHASONE SODIUM PHOSPHATE 10 MG/ML IJ SOLN
INTRAMUSCULAR | Status: AC
  Filled 2021-02-23: qty 1

## 2021-02-23 MED ORDER — DEXAMETHASONE SODIUM PHOSPHATE 10 MG/ML IJ SOLN
INTRAMUSCULAR | Status: DC | PRN
  Administered 2021-02-23: 10 mg via INTRAVENOUS

## 2021-02-23 MED ORDER — SUCCINYLCHOLINE CHLORIDE 200 MG/10ML IV SOSY
PREFILLED_SYRINGE | INTRAVENOUS | Status: AC
  Filled 2021-02-23: qty 10

## 2021-02-23 MED ORDER — FENTANYL CITRATE (PF) 100 MCG/2ML IJ SOLN
INTRAMUSCULAR | Status: AC
  Filled 2021-02-23: qty 2

## 2021-02-23 MED ORDER — DEXMEDETOMIDINE HCL IN NACL 200 MCG/50ML IV SOLN
INTRAVENOUS | Status: AC
  Filled 2021-02-23: qty 50

## 2021-02-23 MED ORDER — ORAL CARE MOUTH RINSE: 15.0000 mL | Freq: Once | OROMUCOSAL | Status: AC

## 2021-02-23 MED ORDER — LIDOCAINE HCL (PF) 2 % IJ SOLN
INTRAMUSCULAR | Status: AC
  Filled 2021-02-23: qty 5

## 2021-02-23 NOTE — Progress Notes (Signed)
PROVIDER NOTE: Information contained herein reflects review and annotations entered in association with encounter. Interpretation of such information and data should be left to medically-trained personnel. Information provided to patient can be located elsewhere in the medical record under "Patient Instructions". Document created using STT-dictation technology, any transcriptional errors that may result from process are unintentional.    Patient: Kara [name]  Service Category: E/M  Provider: Gaspar Cola, MD  DOB: [birth year]  DOS: 02/24/2021  Specialty: Interventional Pain Management  MRN: [id number]  Setting: Ambulatory outpatient  PCP: Tracie Harrier, MD  Type: Established Patient    Referring Provider: Tracie Harrier, MD  Location: Office  Delivery: Face-to-face     HPI  Kara [name], a 70 y.o. 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 Kara [name] primary complain today is Back Pain (Lower/) Last encounter: My last encounter with her was on 02/04/2021. 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); Lumbar pars defect (L3 and L4) (Bilateral); Diabetic peripheral neuropathy ([place]); Chronic lower extremity pain (2ry area of Pain) (Bilateral); Chronic radicular pain of lower extremity; Osteoarthritis of hip (Right); Chronic low back pain (1ry area of Pain) (Bilateral) w/o sciatica; Spondylosis  without myelopathy or radiculopathy, lumbosacral region; Other specified dorsopathies, sacral and sacrococcygeal region; Secondary osteoarthritis of multiple sites; Sacral insufficiency fracture; DDD (degenerative disc disease), lumbosacral; Coccygodynia; Chronic sacroiliac joint pain (Left); Osteoarthritis of lumbar spine; Spondylolisthesis, lumbosacral region; Abnormal MRI, lumbar spine (08/09/2019); and Spasm of muscle of lower back on their pertinent problem list. Pain Assessment: Severity of Chronic pain is reported as a 10-Worst pain ever/10. Location: Back Right, Left/pain radiaites down both leg to the back of her calf. Onset: More than a month ago. Quality: Aching. Timing: Constant. Modifying factor(s): Meds and procedures. Vitals:  height is $RemoveB'5\' 3"'cCJNddKN$  (1.6 m) and weight is 206 lb (93.4 kg). Her temperature is 97.9 F (36.6 C). Her blood pressure is 137/66 and her pulse is 91. Her respiration is 15 and oxygen saturation is 99%.   Reason for encounter: worsening of previously known (established) problem.  The patient returns to the clinic today indicating that she is having recurrence of her low back pain, bilaterally.  Today we talked about the procedures and how we need to make sure that we space them at least 2 months apart.  I have explained to the patient the need to do that based on the risk of possible side effects from the steroids.  She indicates recently having had a lung biopsy secondary to a small nodule that they recently found.  She is not sure of the results yet but she is hoping that it will not be cancer.  She is a non-smoker.  She has been losing some weight (40 pounds) and her BMI is now 36.49 kg/m.  I have informed the patient that once we cross there is a 35 mark, we will definitely consider the possibility of doing radiofrequency ablation for the purpose of extending on her relief and hopefully decreasing the amount of procedures that we have to do.  I also informed the patient that  her goal should be to bring her BMI to 30 or less.  She understood and accepted and she indicated that she is working on it.  Meanwhile, she still having pain on hyperextension on rotation of the lumbar spine compatible with lumbar facet arthropathy/arthralgia.  She has had good results with injections in that area and therefore we will be scheduling her to return for palliative bilateral lumbar facet blocks.  Pharmacotherapy Assessment  Analgesic: Hydrocodone/APAP 5/325, 1 tab PO QD PRN (5 mg/day of hydrocodone) (5 MME) + tramadol 50 mg, 1 tab p.o. every 8 hours (150 mg/day of tramadol) (15 MME) MME: 20 mg/day.   Monitoring: [place] PMP: PDMP reviewed during this encounter.       Pharmacotherapy: No side-effects or adverse reactions reported. Compliance: No problems identified. Effectiveness: Clinically acceptable.  Chauncey Fischer, RN  02/24/2021  2:05 PM  Sign when Signing Visit Safety precautions to be maintained throughout the outpatient stay will include: orient to surroundings, keep bed in low position, maintain call bell within reach at all times, provide assistance with transfer out of bed and ambulation.      UDS:  Summary  Date Value Ref Range Status  11/12/2020 Note  Final    Comment:    ==================================================================== [employer] [facility] 13 (MW) ==================================================================== Test                             Result       Flag       Units  Drug Present and Declared for Prescription Verification   Norhydrocodone                 61           EXPECTED   ng/mg creat    Norhydrocodone is an expected metabolite of hydrocodone.    Tramadol                       >4202        EXPECTED   ng/mg creat   O-Desmethyltramadol            >4202        EXPECTED   ng/mg creat   N-Desmethyltramadol            1712         EXPECTED   ng/mg creat    Source of tramadol is a prescription medication. O-desmethyltramadol    and  N-desmethyltramadol are expected metabolites of tramadol.  Drug Absent but Declared for Prescription Verification   Hydrocodone                    Not Detected UNEXPECTED ng/mg creat    Hydrocodone is almost always present in patients taking this drug    consistently. Absence of hydrocodone could be due to lapse of time    since the last dose or unusual pharmacokinetics (rapid metabolism).  ==================================================================== Test                      Result    Flag   Units      Ref Range   Creatinine              119              mg/dL      >=20 ==================================================================== Declared Medications:  The flagging and interpretation on  this report are based on the  following declared medications.  Unexpected results may arise from  inaccuracies in the declared medications.   **Note: The testing scope of this panel includes these medications:   Hydrocodone  Tramadol   **Note: The testing scope of this panel does not include the  following reported medications:   Acetaminophen  Alendronate (Fosamax)  Aspirin  Calcium  Candesartan  Cholecalciferol  Cyanocobalamin  Cyclobenzaprine  Dulaglutide  Gabapentin  Glipizide  Meclizine  Multivitamin  Nystatin  Ondansetron  Ropinirole  Tolterodine  Warfarin ==================================================================== For clinical consultation, please call [phone]. ====================================================================      ROS  Constitutional: Denies any fever or chills Gastrointestinal: No reported hemesis, hematochezia, vomiting, or acute GI distress Musculoskeletal: Denies any acute onset joint swelling, redness, loss of ROM, or weakness Neurological: No reported episodes of acute onset apraxia, aphasia, dysarthria, agnosia, amnesia, paralysis, loss of coordination, or loss of consciousness  Medication Review  Dulaglutide,  HYDROcodone-acetaminophen, Oxycodone HCl, Vitamin B-12, alendronate, calcium carbonate, candesartan, cholecalciferol, cyclobenzaprine, gabapentin, glipiZIDE, hydrocortisone, meclizine, multivitamin with minerals, rOPINIRole, tolterodine, traMADol, and warfarin  History Review  Allergy: Kara [name] is allergic to adhesive [tape], other, latex, and morphine and related. Drug: Kara [name]  reports no history of drug use. Alcohol:  reports no history of alcohol use. Tobacco:  reports that she has never smoked. She has never used smokeless tobacco. Social: Kara [name]  reports that she has never smoked. She has never used smokeless tobacco. She reports that she does not drink alcohol and does not use drugs. 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 Surgical: Kara [name]  has a past surgical history that includes Knee surgery; Carpal tunnel release (Bilateral); Colon resection sigmoid (N/A, 11/02/2016); Colostomy (N/A, 11/02/2016); Sigmoidoscopy (N/A, 11/13/2016); PULMONARY VENOGRAPHY (N/A, 11/19/2016); Colonoscopy with propofol (N/A, 02/03/2017); Colon surgery (11/02/2016); IVC FILTER INSERTION (Right, 10/2016); Colostomy reversal (N/A, 02/24/2017); Appendectomy (N/A, 02/24/2017); Lysis of adhesion (N/A, 02/24/2017); laparoscopy (N/A, 02/24/2017); Ileo loop diversion (N/A, 02/24/2017); Sigmoidoscopy (N/A, 05/19/2017); Ileostomy closure (N/A, 06/01/2017); IVC FILTER REMOVAL (N/A, 08/09/2017); Sacroplasty (N/A, 11/08/2017); Cataract extraction w/PHACO (Left, 11/21/2018); Cataract extraction w/PHACO (Right, 12/12/2018); and Video bronchoscopy with endobronchial navigation (N/A, 02/23/2021). Family: family history includes Breast cancer in her mother;  Cancer in her mother; Diabetes in her mother and sister; Heart disease in her father, mother, and sister.  Laboratory Chemistry Profile   Renal Lab Results  Component Value Date   BUN 16 02/19/2021   CREATININE 0.70 02/19/2021   GFRAA >60 07/09/2019   GFRNONAA >60 02/19/2021    Hepatic Lab Results  Component Value Date   AST 23 02/19/2021   ALT 27 02/19/2021   ALBUMIN 3.6 02/19/2021   ALKPHOS 43 02/19/2021   LIPASE 38 08/11/2020    Electrolytes Lab Results  Component Value Date   NA 139 02/19/2021   K 3.5 02/19/2021   CL 106 02/19/2021   CALCIUM 9.0 02/19/2021   MG 1.7 08/14/2020   PHOS 3.3 11/07/2016    Bone Lab Results  Component Value Date   25OHVITD1 25 (L) 11/02/2017   25OHVITD2 <1.0 11/02/2017   25OHVITD3 25 11/02/2017    Inflammation (CRP: Acute Phase) (ESR: Chronic Phase) Lab Results  Component Value Date   CRP 1.7 (H) 08/15/2020   ESRSEDRATE 12 11/02/2017  Note: Above Lab results reviewed.  Recent Imaging Review  DG Chest Port 1 View CLINICAL DATA:  Post RIGHT upper lobe biopsy  EXAM: PORTABLE CHEST 1 VIEW  COMPARISON:  Portable exam 1528 hours compared to 08/14/2020  Correlation: CT chest 02/18/2021  FINDINGS: Enlargement of cardiac silhouette with normal pulmonary vascularity.  Atherosclerotic calcification aorta.  Prominent RIGHT paratracheal soft tissues, likely related to technique; no adenopathy in RIGHT paratracheal region on recent CT.  Mild hazy infiltrate RIGHT mid lung.  No pleural effusion or pneumothorax.  Osseous structures unremarkable.  IMPRESSION: Hazy infiltrate within RIGHT mid lung which may be related to preceding biopsy.  No pneumothorax.  Electronically Signed   By: Lavonia Dana M.D.   On: 02/23/2021 17:15 DG C-Arm 1-60 Min-No Report Fluoroscopy was utilized by the requesting physician.  No radiographic  interpretation.  Note: Reviewed        Physical Exam  General appearance: Well nourished,  well developed, and well hydrated. In no apparent acute distress Mental status: Alert, oriented x 3 (person, place, & time)       Respiratory: No evidence of acute respiratory distress Eyes: PERLA Vitals: BP 137/66   Pulse 91   Temp 97.9 F (36.6 C)   Resp 15   Ht $R'5\' 3"'qf$  (1.6 m)   Wt 206 lb (93.4 kg)   SpO2 99%   BMI 36.49 kg/m  BMI: Estimated body mass index is 36.49 kg/m as calculated from the following:   Height as of this encounter: $RemoveBeforeD'5\' 3"'yswfaKnDeyNsdE$  (1.6 m).   Weight as of this encounter: 206 lb (93.4 kg). Ideal: Ideal body weight: 52.4 kg (115 lb 8.3 oz) Adjusted ideal body weight: 68.8 kg (151 lb 11.4 oz)  Assessment   Status Diagnosis  Controlled Controlled Controlled 1. Lumbar facet syndrome (Bilateral)   2. Lumbar facet arthropathy (L3-4, L4-5, and L5-S1) (Bilateral)   3. 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 Chronic anticoagulation (COUMADIN)      Updated Problems: No problems updated.  Plan of Care  Problem-specific:  No problem-specific Assessment & Plan notes found for this encounter.  Kara [name] has a current medication list which includes the following long-term medication(s): calcium carbonate, candesartan, glipizide, glipizide, hydrocodone-acetaminophen, ropinirole, warfarin, warfarin, and tramadol.  Pharmacotherapy (Medications Ordered): No orders of the defined types were placed in this encounter.  Orders:  Orders Placed This Encounter  Procedures   LUMBAR FACET(MEDIAL BRANCH NERVE BLOCK) MBNB    Standing Status:   Future    Standing Expiration Date:   05/27/2021    Scheduling Instructions:     Procedure: Lumbar facet block (AKA.: Lumbosacral medial branch nerve block)     Side: Bilateral     Level: L3-4, L4-5, & L5-S1 Facets (L2, L3, L4, L5, & S1 Medial Branch Nerves)     Sedation: Patient's choice.     Timeframe: ASAA    Order Specific Question:   Where will this procedure be performed?    Answer:   [name]   Blood  Thinner Instructions to Nursing    Always make sure patient has clearance from prescribing physician to stop blood thinners for interventional therapies. If the patient requires a Lovenox-bridge therapy, make sure arrangements are made to institute it with the assistance of the PCP.    Scheduling Instructions:     Have Kara [name] stop the Coumadin (Warfarin) X 5 days prior to procedure or surgery.    Follow-up plan:   Return for  scheduled encounter, (Clinic) procedure: (B) L-FCT BLK, (Sed-anx), (Blood Thinner Protocol).     Interventional Therapies  Risk  Complexity  Complicating Factors:   Estimated body mass index is 36.49 kg/m as calculated from the following:   Height as of this encounter: _0  (1.6 m).   Weight as of this encounter: 206 lb (93.4 kg). 1.  Coumadin anticoagulation  (Stop: 5 days  Re-start: 2hrs) 2.  Latex allergy  3.  Morbid obesity (class III) (BMI>40)  4.  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   Under consideration:   Palliative procedures until BMI<35.   Completed:   Palliative/Therapeutic left L4-5 LESI x1 (11/10/2017) Palliative left lumbar facet block x14 (06/24/2020) Palliative right lumbar facet block(s) x19 (08/05/2020) Palliative right SI joint block x8 (not as effective as facet Blk) (03/08/2019) Diagnostic left SI joint block x1 (not as effective as facet Blk) (03/08/2019) Palliative/Therapeutic (Midline) caudal ESI x3 (06/08/2018)   Palliative options:   Palliative/Therapeutic left L4-5 LESI #2   Palliative left lumbar facet block #15  Palliative right lumbar facet block(s) #19  Palliative right SI joint block #9  Diagnostic left SI joint block #2  Palliative/Therapeutic (Midline) caudal ESI #4     Recent Visits Date Type Provider Dept  12/30/20 Telemedicine Milinda Pointer, MD [name]  12/09/20 Procedure visit  Milinda Pointer, MD [name]  Showing recent visits within past 90 days and meeting all other requirements Today's Visits Date Type Provider Dept  02/24/21 Office Visit Milinda Pointer, MD [name]  Showing today's visits and meeting all other requirements Future Appointments Date Type Provider Dept  03/03/21 Appointment Milinda Pointer, MD [name]  Showing future appointments within next 90 days and meeting all other requirements I discussed the assessment and treatment plan with the patient. The patient was provided an opportunity to ask questions and all were answered. The patient agreed with the plan and demonstrated an understanding of the instructions.  Patient advised to call back or seek an in-person evaluation if the symptoms or condition worsens.  Duration of encounter: 30 minutes.  Note by: Gaspar Cola, MD Date: 02/24/2021; Time: 4:11 PM

## 2021-02-23 NOTE — Transfer of Care (Signed)
Immediate Anesthesia Transfer of Care Note  Patient: Kara [name]  Procedure(s) Performed: ROBOTIC VIDEO BRONCHOSCOPY WITH ENDOBRONCHIAL NAVIGATION  Patient Location: PACU  Anesthesia Type:General  Level of Consciousness: awake  Airway & Oxygen Therapy: Patient Spontanous Breathing  Post-op Assessment: Report given to RN  Post vital signs: stable  Last Vitals:  Vitals Value Taken Time  BP 128/67 02/23/21 1505  Temp 97.0   Pulse 90 02/23/21 1507  Resp 18 02/23/21 1507  SpO2 90 % 02/23/21 1507  Vitals shown include unvalidated device data.  Last Pain:  Vitals:   02/23/21 1120  TempSrc: Temporal  PainSc: 10-Worst pain ever         Complications: No notable events documented.

## 2021-02-23 NOTE — Anesthesia Preprocedure Evaluation (Signed)
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/p IVC and subsequent removal)  Normal cardiovascular exam Rhythm:Regular Rate:Normal     Neuro/Psych Vertigo   Neuromuscular disease (diabetic neuropathy)    GI/Hepatic negative GI ROS,   Endo/Other  diabetes, Type 2Obesity   Renal/GU negative Renal ROS     Musculoskeletal  (+) Arthritis ,   Abdominal   Peds  Hematology negative hematology ROS (+)   Anesthesia Other Findings   Reproductive/Obstetrics                             Anesthesia Physical Anesthesia Plan  ASA: 3  Anesthesia Plan: General   Post-op Pain Management:    Induction: Intravenous  PONV Risk Score and Plan: 3 and Ondansetron, Dexamethasone and Treatment may vary due to age or medical condition  Airway Management Planned: Oral ETT  Additional Equipment:   Intra-op Plan:   Post-operative Plan: Extubation in OR  Informed Consent: I have reviewed the patients History and Physical, chart, labs and discussed the procedure including the risks, benefits and alternatives for the proposed anesthesia with the patient or authorized representative who has indicated his/her understanding and acceptance.       Plan Discussed with: CRNA  Anesthesia Plan Comments:         Anesthesia Quick Evaluation

## 2021-02-23 NOTE — Discharge Instructions (Addendum)
AMBULATORY SURGERY  DISCHARGE INSTRUCTIONS   The drugs that you were given will stay in your system until tomorrow so for the next 24 hours you should not:  Drive an automobile Make any legal decisions Drink any alcoholic beverage   You may resume regular meals tomorrow.  Today it is better to start with liquids and gradually work up to solid foods.  You may eat anything you prefer, but it is better to start with liquids, then soup and crackers, and gradually work up to solid foods.   Please notify your doctor immediately if you have any unusual bleeding, trouble breathing, redness and pain at the surgery site, drainage, fever, or pain not relieved by medication.    Additional Instructions:        Please contact your physician with any problems or Same Day Surgery at [phone], Monday through Friday 6 am to 4 pm, or [place] at [facility] number at [phone]. AMBULATORY SURGERY  DISCHARGE INSTRUCTIONS   The drugs that you were given will stay in your system until tomorrow so for the next 24 hours you should not:  Drive an automobile Make any legal decisions Drink any alcoholic beverage   You may resume regular meals tomorrow.  Today it is better to start with liquids and gradually work up to solid foods.  You may eat anything you prefer, but it is better to start with liquids, then soup and crackers, and gradually work up to solid foods.   Please notify your doctor immediately if you have any unusual bleeding, trouble breathing, redness and pain at the surgery site, drainage, fever, or pain not relieved by medication.    Additional Instructions: May Restart Coumadin tommorow        Please contact your physician with any problems or Same Day Surgery at [phone], Monday through Friday 6 am to 4 pm, or [place] at [facility] number at [phone].

## 2021-02-23 NOTE — Anesthesia Procedure Notes (Signed)
Procedure Name: Intubation Date/Time: 02/23/2021 1:11 PM Performed by: Garner Nash, CRNA Pre-anesthesia Checklist: Patient identified, Emergency Drugs available, Suction available and Patient being monitored Patient Re-evaluated:Patient Re-evaluated prior to induction Oxygen Delivery Method: Circle system utilized Preoxygenation: Pre-oxygenation with 100% oxygen Induction Type: IV induction Ventilation: Mask ventilation without difficulty Laryngoscope Size: Mac and 3 Grade View: Grade I Tube type: Oral Tube size: 8.5 mm Number of attempts: 1 Airway Equipment and Method: Stylet and Oral airway Placement Confirmation: ETT inserted through vocal cords under direct vision, positive ETCO2 and breath sounds checked- equal and bilateral Tube secured with: Tape Dental Injury: Teeth and Oropharynx as per pre-operative assessment

## 2021-02-23 NOTE — Interval H&P Note (Signed)
History and Physical Interval Note:  02/23/2021 11:58 AM  Kara [name]  has presented today for surgery, with the diagnosis of LUNG NODULE.  The various methods of treatment have been discussed with the patient and family. After consideration of risks, benefits and other options for treatment, the patient has consented to  Procedure(s): ROBOTIC [place] (N/A) as a surgical intervention.  The patient's history has been reviewed, patient examined, no change in status, stable for surgery.  I have reviewed the patient's chart and labs.  Questions were answered to the patient's satisfaction.     Vernard Gambles

## 2021-02-23 NOTE — Op Note (Addendum)
Robotic assisted bronchoscopy Cellvizio probe based confocal laser endomicroscopy (pCLE) Augmented fluoroscopy   Indication: Solitary right upper lobe nodule, large in by serial CT, hypermetabolic at SUV max of 8.50, rule out cancer.  Preoperative Diagnosis: Right upper lobe nodule, rule out cancer Post Procedure Diagnosis: Same as above Consent: Verbal/Written: obtained  Benefits, limitations and potential complications of the procedure were discussed with the patient/family.  Complications from bronchoscopy are rare and most often minor, but if they occur they may include breathing difficulty, vocal cord spasm, hoarseness, slight fever, vomiting, dizziness, bronchospasm, infection, low blood oxygen, bleeding from biopsy site, or an allergic reaction to medications.  It is uncommon for patients to experience other more serious complications for example: Collapsed lung requiring chest tube placement, respiratory failure, heart attack and/or cardiac arrhythmia.  Patient understood the potential complications and agreed to proceed.  Surgeon: Renold Don, MD Assistant/Scrub: Sullivan Lone, RRT Circulator: Annia Belt, RRT Anesthesiologist/CRNA: Darrin Nipper, MD/Rebecca Lorenda Peck, CRNA Cytotechnology: Mosie Epstein Fluoroscopy technician: Rolin Barry, RT Representatives: Edwena Felty, Katrine Coho.  Roger Honeycutt Body Vision  Type of Anesthesia: General endotracheal  Procedures Performed:   Robotic bronchoscopy: Procedure consists of robotic navigation comprised of electromagnetics, optical pattern recognition and robotic kinematic data - to triangulate bronchoscope location during the procedure and provide accurate positional data to biopsy a lesion. Cellvizio probe based confocal laser endomicroscopy (pCLE) utilizing blue laser endomicroscopy. Augmented fluoroscopy with Body Vision with tool in lesion capability.  Description of Procedure:  Robotic  bronchoscopy: The patient was brought to Procedure Room 2 (Bronchoscopy Suite) in the OR area where appropriate timeout was taken with the staff after the patient was inducted under general anesthesia.  The patient was inducted under general anesthesia and intubated by the anesthesia team.  Patient was intubated with a 8.5 ET tube without difficulty.  Tube was secured at 4 cm above the carina.  A Portex adapter was placed on the ET tube flange.  Once the patient was under adequate general anesthesia the Olympus therapeutic video bronchoscope was advanced and an anatomic airway tour and surveillance bronchoscopy was performed.The distal trachea appeared unremarkable. The main carina was sharp.  No secretions were seen in either right or left mainstem bronchi. The RUL, RLL, RML appeared to be free of endobronchial masses, lesions, or purulent secretions. Likewise, the LLL/LUL appeared to be free of endobronchial masses, lesions, or purulent secretions.  Once the survey bronchoscopy was completed, registration for the augmented fluoroscopy (Body Vision) was then performed with the fluoroscopic C arm.  Once this was completed, the robotic bronchoscope ET tube adapter was placed and ETT was cut to proper length and secured on the mid plane.  The [facility] robotic scope was then advanced through the ETT and registration was performed successfully.  There was good correlation between the robotic mapping and bronchoscopic mapping. With the assistance of fused navigation, the bronchoscope was advanced to the RUL nodule/mass. The tip of the working channel sat within 18 mm of the nodule  Positioning was confirmed with augmented fluoroscopy.  At this point Cellvizio probe based confocal laser endomicroscopy (pCLE) was utilized to confirm target acquisition.  Because of the stiffness of the Cellvizio probe it could not be advanced directly to the lesion.  At this point, augmented fluoroscopy via Body Vision was utilized to  optimize the position most favorable for biopsies, then the robotic bronchoscope was anchored to maintain position.  The lesion appeared to be wedged between 2 small bronchioles and a  [employer] Needle was then used to penetrate the secondary carina in the right upper lobe where the lesion was visible.  Two transbronchial aspirate passes were performed, cytology material showed atypical cells on Dazha.  At this point 2 passes with a cytology brush, guided by augmented fluoroscopy, on the RUL nodule/mass were performed, the brushes were cut into CytoLyt preservative after Doyle showed atypical cells. After confirmation of excellent hemostasis, 12  biopsy specimens were obtained of the RUL lesion with Amber on first specimen showing atypical cells.  After ensuring adequate hemostasis, a BAL was also obtained at this segment, which sent for cytology analysis.  For BAL sample acquisition purposes, 40 ml of normal saline were instilled, and approximately 12 ml were recovered/trapped and sent for analysis.   The robotic bronchoscope was then retracted all the way out after confirmation of excellent hemostasis. The patient tolerated the procedure well. No significant bleeding was observed at the conclusion of the procedure.  At this point, the patient was allowed to emerge from general anesthesia, and was extubated in the procedure room without incident.  The patient  was taken to the PACU in satisfactory condition.  Auscultation of the lungs showed no change from pre bronchoscopy examination.  Patient tolerated the procedure well with no untoward effects of anesthesia noted.   Specimens Obtained:  Transbronchial Forceps Biopsy: X12  Transbronchial Brush: X2  Transbronchial needle aspirate: X2  Targeted BAL: 12 mL aliquot  Fluoroscopy: Augmented fluoroscopy (Body Vision) was utilized during the course of this procedure to assure that biopsies were taken in a safe manner under fluoroscopic guidance  with spot films required.  Total fluoroscopy time: 6 minutes 39 seconds.  Intraoperative image:    Augmented fluoroscopy image showing tool in lesion:     Complications:None, no pneumothorax on post film:    Estimated Blood Loss: Less than 2 mL    Assessment and Plan/Additional Comments: Right upper lobe nodule, rule out cancer Await pathology report     C. Derrill Kay, MD Advanced Bronchoscopy PCCM [facility]    *This note was dictated using voice recognition software/Dragon.  Despite best efforts to proofread, errors can occur which can change the meaning.  Any change was purely unintentional.

## 2021-02-24 ENCOUNTER — Encounter: Payer: Self-pay | Admitting: Pulmonary Disease

## 2021-02-24 ENCOUNTER — Ambulatory Visit: Payer: PPO | Attending: Pain Medicine | Admitting: Pain Medicine

## 2021-02-24 VITALS — BP 137/66 | HR 91 | Temp 97.9°F | Resp 15 | Ht 63.0 in | Wt 206.0 lb

## 2021-02-24 DIAGNOSIS — M47816 Spondylosis without myelopathy or radiculopathy, lumbar region: Secondary | ICD-10-CM | POA: Diagnosis not present

## 2021-02-24 DIAGNOSIS — Z7901 Long term (current) use of anticoagulants: Secondary | ICD-10-CM | POA: Insufficient documentation

## 2021-02-24 DIAGNOSIS — M4310 Spondylolisthesis, site unspecified: Secondary | ICD-10-CM | POA: Insufficient documentation

## 2021-02-24 NOTE — Anesthesia Postprocedure Evaluation (Signed)
Anesthesia Post Note  Patient: Kara [name]  Procedure(s) Performed: ROBOTIC [place]  Patient location during evaluation: PACU Anesthesia Type: General Level of consciousness: awake and alert, oriented and patient cooperative Pain management: pain level controlled Vital Signs Assessment: post-procedure vital signs reviewed and stable Respiratory status: spontaneous breathing, nonlabored ventilation and respiratory function stable Cardiovascular status: blood pressure returned to baseline and stable Postop Assessment: adequate PO intake Anesthetic complications: no   No notable events documented.   Last Vitals:  Vitals:   02/23/21 1536 02/23/21 1549  BP: 110/60 121/74  Pulse: 80 83  Resp: 14 16  Temp:  (!) 36.1 C  SpO2: 97% 97%    Last Pain:  Vitals:   02/23/21 1549  TempSrc: Temporal  PainSc: 0-No pain                 Darrin Nipper

## 2021-02-24 NOTE — Progress Notes (Signed)
Safety precautions to be maintained throughout the outpatient stay will include: orient to surroundings, keep bed in low position, maintain call bell within reach at all times, provide assistance with transfer out of bed and ambulation.  

## 2021-02-25 ENCOUNTER — Other Ambulatory Visit: Payer: Self-pay | Admitting: Anatomic Pathology & Clinical Pathology

## 2021-02-25 ENCOUNTER — Telehealth: Payer: Self-pay | Admitting: Pulmonary Disease

## 2021-02-25 LAB — CYTOLOGY - NON PAP

## 2021-02-25 LAB — SURGICAL PATHOLOGY

## 2021-02-25 NOTE — Telephone Encounter (Signed)
Routing to Dr. Patsey Berthold to make aware.

## 2021-02-25 NOTE — Telephone Encounter (Signed)
Patient is aware of results.  She has follow-up with radiation oncology on 10 October already set up.

## 2021-02-25 NOTE — Telephone Encounter (Signed)
Dr. Patsey Berthold, please see below message and advise. Thanks

## 2021-02-26 DIAGNOSIS — M9902 Segmental and somatic dysfunction of thoracic region: Secondary | ICD-10-CM | POA: Diagnosis not present

## 2021-02-26 DIAGNOSIS — M9901 Segmental and somatic dysfunction of cervical region: Secondary | ICD-10-CM | POA: Diagnosis not present

## 2021-02-26 DIAGNOSIS — M531 Cervicobrachial syndrome: Secondary | ICD-10-CM | POA: Diagnosis not present

## 2021-02-27 ENCOUNTER — Ambulatory Visit: Payer: PPO | Admitting: Radiation Oncology

## 2021-03-02 ENCOUNTER — Other Ambulatory Visit: Payer: Self-pay

## 2021-03-02 ENCOUNTER — Ambulatory Visit
Admission: RE | Admit: 2021-03-02 | Discharge: 2021-03-02 | Disposition: A | Discharge status: Home / Self Care | Payer: PPO | Source: Ambulatory Visit | Attending: Radiation Oncology | Admitting: Radiation Oncology

## 2021-03-02 ENCOUNTER — Encounter: Payer: Self-pay | Admitting: Radiation Oncology

## 2021-03-02 ENCOUNTER — Encounter: Payer: Self-pay | Admitting: *Deleted

## 2021-03-02 VITALS — BP 105/66 | HR 96 | Temp 97.5°F | Resp 20 | Wt 203.3 lb

## 2021-03-02 DIAGNOSIS — C3411 Malignant neoplasm of upper lobe, right bronchus or lung: Secondary | ICD-10-CM

## 2021-03-02 DIAGNOSIS — R918 Other nonspecific abnormal finding of lung field: Secondary | ICD-10-CM | POA: Diagnosis not present

## 2021-03-02 NOTE — Progress Notes (Signed)
PROVIDER NOTE: Information contained herein reflects review and annotations entered in association with encounter. Interpretation of such information and data should be left to medically-trained personnel. Information provided to patient can be located elsewhere in the medical record under "Patient Instructions". Document created using STT-dictation technology, any transcriptional errors that may result from process are unintentional.    Patient: Kara [name]  Service Category: Procedure  Provider: Gaspar Cola, MD  DOB: 05/09/1951  DOS: 03/03/2021  Location: [facility]  MRN: [id number]  Setting: Ambulatory - outpatient  Referring Provider: Tracie Harrier, MD  Type: Established Patient  Specialty: Interventional Pain Management  PCP: Kara Harrier, MD   Primary Reason for Visit: Interventional Pain Management Treatment. CC: Back Pain (lower)    Procedure:          Anesthesia, Analgesia, Anxiolysis:  Type: Lumbar Facet, Medial Branch Block(s)          Primary Purpose: Diagnostic Region: Posterolateral Lumbosacral Spine Level: L2, L3, L4, L5, & S1 Medial Branch Level(s). Injecting these levels blocks the L3-4, L4-5, and L5-S1 lumbar facet joints. Laterality: Bilateral  Type: Local Anesthesia Local Anesthetic: Lidocaine 1-2% Sedation: Minimal Anxiolysis  Indication(s): Anxiety & Analgesia Route: Infiltration ([place]/IM) IV Access: Available   Position: Prone   Indications: 1. Lumbar facet syndrome (Bilateral)   2. Spondylosis without myelopathy or radiculopathy, lumbosacral region   3. 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 Lumbar facet arthropathy (L3-4, L4-5, and L5-S1) (Bilateral)   5. 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 Osteoarthritis of lumbar spine   7. 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 DDD (degenerative disc disease), lumbosacral   9. Chronic low back pain (1ry area of Pain) (Bilateral) w/o  sciatica   10. Latex precautions, history of latex allergy   11. Chronic anticoagulation (COUMADIN)   12. Severe obesity (BMI 35.0-39.9) with comorbidity (HCC)    Pain Score: Pre-procedure: 10-Worst pain ever/10 Post-procedure: 7  (pain started after she stood up)/10     Today I have informed the patient that we we will no longer be prescribing any opioid analgesics.  Unfortunately, review of the PMP has revealed that the patient has been getting oxycodone from her PCP, on multiple occasions.  This is a direct violation of our medication agreement.  The patient has admitted to me that she has not read any of the information we have given her regarding our medication rules and regulations.  In any case she should have known better since she also signed a medication agreement that was explained to her.  She understood and accepted.  Pharmacotherapy Assessment  Analgesic: Oxycodone IR 10 mg, 1 tab PO qd,  from PCP. No chronic opioid analgesics therapy prescribed by our practice, 2ry to Medication Agreement Violation. (Getting oxycodone from PCP in multiple occasions, according to PMP)(11/21/20; 12/26/20; 01/16/21; 02/13/21). Opioid analgesic pharmacotherapy D/C'ed on 03/03/2021. MME: 20 mg/day.   Monitoring: [place] PMP: PDMP reviewed during this encounter.       Pharmacotherapy: No side-effects or adverse reactions reported. Compliance: No problems identified. Effectiveness: Clinically acceptable.  UDS:  Summary  Date Value Ref Range Status  11/12/2020 Note  Final    Comment:    ==================================================================== [employer] Select 13 (MW) ==================================================================== Test                             Result       Flag  Units  Drug Present and Declared for Prescription Verification   Norhydrocodone                 61           EXPECTED   ng/mg creat    Norhydrocodone is an expected metabolite of hydrocodone.    Tramadol                        >4202        EXPECTED   ng/mg creat   O-Desmethyltramadol            >4202        EXPECTED   ng/mg creat   N-Desmethyltramadol            1712         EXPECTED   ng/mg creat    Source of tramadol is a prescription medication. O-desmethyltramadol    and N-desmethyltramadol are expected metabolites of tramadol.  Drug Absent but Declared for Prescription Verification   Hydrocodone                    Not Detected UNEXPECTED ng/mg creat    Hydrocodone is almost always present in patients taking this drug    consistently. Absence of hydrocodone could be due to lapse of time    since the last dose or unusual pharmacokinetics (rapid metabolism).  ==================================================================== Test                      Result    Flag   Units      Ref Range   Creatinine              119              mg/dL      >=20 ==================================================================== Declared Medications:  The flagging and interpretation on this report are based on the  following declared medications.  Unexpected results may arise from  inaccuracies in the declared medications.   **Note: The testing scope of this panel includes these medications:   Hydrocodone  Tramadol   **Note: The testing scope of this panel does not include the  following reported medications:   Acetaminophen  Alendronate (Fosamax)  Aspirin  Calcium  Candesartan  Cholecalciferol  Cyanocobalamin  Cyclobenzaprine  Dulaglutide  Gabapentin  Glipizide  Meclizine  Multivitamin  Nystatin  Ondansetron  Ropinirole  Tolterodine  Warfarin ==================================================================== For clinical consultation, please call [phone]. ====================================================================       Pre-op H&P Assessment:  Kara [name] is a 70 y.o. (year old), female patient, seen today for interventional treatment. She  has a past  surgical history that includes Knee surgery; Carpal tunnel release (Bilateral); Colon resection sigmoid (N/A, 11/02/2016); Colostomy (N/A, 11/02/2016); Sigmoidoscopy (N/A, 11/13/2016); PULMONARY VENOGRAPHY (N/A, 11/19/2016); Colonoscopy with propofol (N/A, 02/03/2017); Colon surgery (11/02/2016); IVC FILTER INSERTION (Right, 10/2016); Colostomy reversal (N/A, 02/24/2017); Appendectomy (N/A, 02/24/2017); Lysis of adhesion (N/A, 02/24/2017); laparoscopy (N/A, 02/24/2017); Ileo loop diversion (N/A, 02/24/2017); Sigmoidoscopy (N/A, 05/19/2017); Ileostomy closure (N/A, 06/01/2017); IVC FILTER REMOVAL (N/A, 08/09/2017); Sacroplasty (N/A, 11/08/2017); Cataract extraction w/PHACO (Left, 11/21/2018); Cataract extraction w/PHACO (Right, 12/12/2018); and Video bronchoscopy with endobronchial navigation (N/A, 02/23/2021). Ms. Hofstra has a current medication list which includes the following prescription(s): alendronate, calcium carbonate, candesartan, cholecalciferol, vitamin b-12, cyclobenzaprine, dulaglutide, gabapentin, gabapentin, glipizide, glipizide, hydrocortisone, meclizine, multivitamin with minerals, oxycodone hcl, ropinirole, tolterodine, warfarin, and warfarin, and the following Facility-Administered Medications: ketorolac and orphenadrine. Her  primarily concern today is the Back Pain (lower)  Initial Vital Signs:  Pulse/HCG Rate: 85ECG Heart Rate: 89 Temp: (!) 97.2 F (36.2 C) Resp: 20 BP: (!) 142/57 SpO2: 98 %  BMI: Estimated body mass index is 36.49 kg/m as calculated from the following:   Height as of this encounter: 5\' 3"  (1.6 m).   Weight as of this encounter: 206 lb (93.4 kg).  Risk Assessment: Allergies: Reviewed. She is allergic to adhesive [tape], other, latex, and morphine and related.  Allergy Precautions: Latex-free protocol activated Coagulopathies: Reviewed. None identified.  Blood-thinner therapy: None at this time Active Infection(s): Reviewed. None identified. Ms. Linson is afebrile  Site  Confirmation: Ms. Cobarrubias was asked to confirm the procedure and laterality before marking the site Procedure checklist: Completed Consent: Before the procedure and under the influence of no sedative(s), amnesic(s), or anxiolytics, the patient was informed of the treatment options, risks and possible complications. To fulfill our ethical and legal obligations, as recommended by the American Medical Association's Code of Ethics, I have informed the patient of my clinical impression; the nature and purpose of the treatment or procedure; the risks, benefits, and possible complications of the intervention; the alternatives, including doing nothing; the risk(s) and benefit(s) of the alternative treatment(s) or procedure(s); and the risk(s) and benefit(s) of doing nothing. The patient was provided information about the general risks and possible complications associated with the procedure. These may include, but are not limited to: failure to achieve desired goals, infection, bleeding, organ or nerve damage, allergic reactions, paralysis, and death. In addition, the patient was informed of those risks and complications associated to Spine-related procedures, such as failure to decrease pain; infection (i.e.: Meningitis, epidural or intraspinal abscess); bleeding (i.e.: epidural hematoma, subarachnoid hemorrhage, or any other type of intraspinal or peri-dural bleeding); organ or nerve damage (i.e.: Any type of peripheral nerve, nerve root, or spinal cord injury) with subsequent damage to sensory, motor, and/or autonomic systems, resulting in permanent pain, numbness, and/or weakness of one or several areas of the body; allergic reactions; (i.e.: anaphylactic reaction); and/or death. Furthermore, the patient was informed of those risks and complications associated with the medications. These include, but are not limited to: allergic reactions (i.e.: anaphylactic or anaphylactoid reaction(s)); adrenal axis suppression;  blood sugar elevation that in diabetics may result in ketoacidosis or comma; water retention that in patients with history of congestive heart failure may result in shortness of breath, pulmonary edema, and decompensation with resultant heart failure; weight gain; swelling or edema; medication-induced neural toxicity; particulate matter embolism and blood vessel occlusion with resultant organ, and/or nervous system infarction; and/or aseptic necrosis of one or more joints. Finally, the patient was informed that Medicine is not an exact science; therefore, there is also the possibility of unforeseen or unpredictable risks and/or possible complications that may result in a catastrophic outcome. The patient indicated having understood very clearly. We have given the patient no guarantees and we have made no promises. Enough time was given to the patient to ask questions, all of which were answered to the patient's satisfaction. Ms. Schlotzhauer has indicated that she wanted to continue with the procedure. Attestation: I, the ordering provider, attest that I have discussed with the patient the benefits, risks, side-effects, alternatives, likelihood of achieving goals, and potential problems during recovery for the procedure that I have provided informed consent. Date  Time: 03/03/2021 10:59 AM  Pre-Procedure Preparation:  Monitoring: As per clinic protocol. Respiration, ETCO2, SpO2, BP, heart rate and rhythm monitor placed  and checked for adequate function Safety Precautions: Patient was assessed for positional comfort and pressure points before starting the procedure. Time-out: I initiated and conducted the "Time-out" before starting the procedure, as per protocol. The patient was asked to participate by confirming the accuracy of the "Time Out" information. Verification of the correct person, site, and procedure were performed and confirmed by me, the nursing staff, and the patient. "Time-out" conducted as per  Joint Commission's Universal Protocol (UP.01.01.01). Time: 1129  Description of Procedure:          Laterality: Bilateral. The procedure was performed in identical fashion on both sides. Levels:  L2, L3, L4, L5, & S1 Medial Branch Level(s) Area Prepped: Posterior Lumbosacral Region DuraPrep (Iodine Povacrylex [0.7% available iodine] and Isopropyl Alcohol, 74% w/w) Safety Precautions: Aspiration looking for blood return was conducted prior to all injections. At no point did we inject any substances, as a needle was being advanced. Before injecting, the patient was told to immediately notify me if she was experiencing any new onset of "ringing in the ears, or metallic taste in the mouth". No attempts were made at seeking any paresthesias. Safe injection practices and needle disposal techniques used. Medications properly checked for expiration dates. SDV (single dose vial) medications used. After the completion of the procedure, all disposable equipment used was discarded in the proper designated medical waste containers. Local Anesthesia: Protocol guidelines were followed. The patient was positioned over the fluoroscopy table. The area was prepped in the usual manner. The time-out was completed. The target area was identified using fluoroscopy. A 12-in long, straight, sterile hemostat was used with fluoroscopic guidance to locate the targets for each level blocked. Once located, the skin was marked with an approved surgical skin marker. Once all sites were marked, the skin (epidermis, dermis, and hypodermis), as well as deeper tissues (fat, connective tissue and muscle) were infiltrated with a small amount of a short-acting local anesthetic, loaded on a 10cc syringe with a 25G, 1.5-in  Needle. An appropriate amount of time was allowed for local anesthetics to take effect before proceeding to the next step. Local Anesthetic: Lidocaine 2.0% The unused portion of the local anesthetic was discarded in the proper  designated containers. Technical explanation of process:  L2 Medial Branch Nerve Block (MBB): The target area for the L2 medial branch is at the junction of the postero-lateral aspect of the superior articular process and the superior, posterior, and medial edge of the transverse process of L3. Under fluoroscopic guidance, a Quincke needle was inserted until contact was made with os over the superior postero-lateral aspect of the pedicular shadow (target area). After negative aspiration for blood, 0.5 mL of the nerve block solution was injected without difficulty or complication. The needle was removed intact. L3 Medial Branch Nerve Block (MBB): The target area for the L3 medial branch is at the junction of the postero-lateral aspect of the superior articular process and the superior, posterior, and medial edge of the transverse process of L4. Under fluoroscopic guidance, a Quincke needle was inserted until contact was made with os over the superior postero-lateral aspect of the pedicular shadow (target area). After negative aspiration for blood, 0.5 mL of the nerve block solution was injected without difficulty or complication. The needle was removed intact. L4 Medial Branch Nerve Block (MBB): The target area for the L4 medial branch is at the junction of the postero-lateral aspect of the superior articular process and the superior, posterior, and medial edge of the transverse process of  L5. Under fluoroscopic guidance, a Quincke needle was inserted until contact was made with os over the superior postero-lateral aspect of the pedicular shadow (target area). After negative aspiration for blood, 0.5 mL of the nerve block solution was injected without difficulty or complication. The needle was removed intact. L5 Medial Branch Nerve Block (MBB): The target area for the L5 medial branch is at the junction of the postero-lateral aspect of the superior articular process and the superior, posterior, and medial edge  of the sacral ala. Under fluoroscopic guidance, a Quincke needle was inserted until contact was made with os over the superior postero-lateral aspect of the pedicular shadow (target area). After negative aspiration for blood, 0.5 mL of the nerve block solution was injected without difficulty or complication. The needle was removed intact. S1 Medial Branch Nerve Block (MBB): The target area for the S1 medial branch is at the posterior and inferior 6 o'clock position of the L5-S1 facet joint. Under fluoroscopic guidance, the Quincke needle inserted for the L5 MBB was redirected until contact was made with os over the inferior and postero aspect of the sacrum, at the 6 o' clock position under the L5-S1 facet joint (Target area). After negative aspiration for blood, 0.5 mL of the nerve block solution was injected without difficulty or complication. The needle was removed intact.  Nerve block solution: 0.2% PF-Ropivacaine + Triamcinolone (40 mg/mL) diluted to a final concentration of 4 mg of Triamcinolone/mL of Ropivacaine The unused portion of the solution was discarded in the proper designated containers. Procedural Needles: 22-gauge, 3.5-inch, Quincke needles used for all levels.  Once the entire procedure was completed, the treated area was cleaned, making sure to leave some of the prepping solution back to take advantage of its long term bactericidal properties.      Illustration of the posterior view of the lumbar spine and the posterior neural structures. Laminae of L2 through S1 are labeled. DPRL5, dorsal primary ramus of L5; DPRS1, dorsal primary ramus of S1; DPR3, dorsal primary ramus of L3; FJ, facet (zygapophyseal) joint L3-L4; I, inferior articular process of L4; LB1, lateral branch of dorsal primary ramus of L1; IAB, inferior articular branches from L3 medial branch (supplies L4-L5 facet joint); IBP, intermediate branch plexus; MB3, medial branch of dorsal primary ramus of L3; NR3, third lumbar  nerve root; S, superior articular process of L5; SAB, superior articular branches from L4 (supplies L4-5 facet joint also); TP3, transverse process of L3.  Vitals:   03/03/21 1136 03/03/21 1140 03/03/21 1150 03/03/21 1200  BP: 116/64 104/76 120/66 126/71  Pulse:  84 85 80  Resp: (!) 26 18 17 16   Temp:  (!) 97.2 F (36.2 C)    TempSrc:      SpO2: 95% 98% 100% 100%  Weight:      Height:         Start Time: 1129 hrs. End Time: 1135 hrs.  Imaging Guidance (Spinal):          Type of Imaging Technique: Fluoroscopy Guidance (Spinal) Indication(s): Assistance in needle guidance and placement for procedures requiring needle placement in or near specific anatomical locations not easily accessible without such assistance. Exposure Time: Please see nurses notes. Contrast: None used. Fluoroscopic Guidance: I was personally present during the use of fluoroscopy. "Tunnel Vision Technique" used to obtain the best possible view of the target area. Parallax error corrected before commencing the procedure. "Direction-depth-direction" technique used to introduce the needle under continuous pulsed fluoroscopy. Once target was reached, antero-posterior, oblique,  and lateral fluoroscopic projection used confirm needle placement in all planes. Images permanently stored in EMR. Interpretation: No contrast injected. I personally interpreted the imaging intraoperatively. Adequate needle placement confirmed in multiple planes. Permanent images saved into the patient's record.  Antibiotic Prophylaxis:   Anti-infectives (From admission, onward)    None      Indication(s): None identified  Post-operative Assessment:  Post-procedure Vital Signs:  Pulse/HCG Rate: 8088 Temp: (!) 97.2 F (36.2 C) Resp: 16 BP: 126/71 SpO2: 100 %  EBL: None  Complications: No immediate post-treatment complications observed by team, or reported by patient.  Note: The patient tolerated the entire procedure well. A repeat  set of vitals were taken after the procedure and the patient was kept under observation following institutional policy, for this type of procedure. Post-procedural neurological assessment was performed, showing return to baseline, prior to discharge. The patient was provided with post-procedure discharge instructions, including a section on how to identify potential problems. Should any problems arise concerning this procedure, the patient was given instructions to immediately contact us, at any time, without hesitation. In any case, we plan to contact the patient by telephone for a follow-up status report regarding this interventional procedure.  Comments:   As it is usual for the patient, again she has requested an IM injection of Toradol/Norflex before going home.  She claims to be having some discomfort.  She has gotten used to getting this option and has consistently requested the injection every time she has a procedure.  Plan of Care  Orders:  Orders Placed This Encounter  Procedures   LUMBAR FACET(MEDIAL BRANCH NERVE BLOCK) MBNB    Scheduling Instructions:     Procedure: Lumbar facet block (AKA.: Lumbosacral medial branch nerve block)     Side: Bilateral     Level: L3-4, L4-5, & L5-S1 Facets (L2, L3, L4, L5, & S1 Medial Branch Nerves)     Sedation: Patient's choice.     Timeframe: Today    Order Specific Question:   Where will this procedure be performed?    Answer:   [name]   DG PAIN CLINIC C-ARM 1-60 MIN NO REPORT    Intraoperative interpretation by procedural physician at [facility].    Standing Status:   Standing    Number of Occurrences:   1    Order Specific Question:   Reason for exam:    Answer:   Assistance in needle guidance and placement for procedures requiring needle placement in or near specific anatomical locations not easily accessible without such assistance.   Informed Consent Details: Physician/Practitioner Attestation; Transcribe to consent  form and obtain patient signature    Nursing Order: Transcribe to consent form and obtain patient signature. Note: Always confirm laterality of pain with Ms. Beaird, before procedure.    Order Specific Question:   Physician/Practitioner attestation of informed consent for procedure/surgical case    Answer:   I, the physician/practitioner, attest that I have discussed with the patient the benefits, risks, side effects, alternatives, likelihood of achieving goals and potential problems during recovery for the procedure that I have provided informed consent.    Order Specific Question:   Procedure    Answer:   Lumbar Facet Block  under fluoroscopic guidance    Order Specific Question:   Physician/Practitioner performing the procedure    Answer:   Tabita Corbo A. Dossie Arbour MD    Order Specific Question:   Indication/Reason    Answer:   Low Back Pain, with our without  leg pain, due to Facet Joint Arthralgia (Joint Pain) Spondylosis (Arthritis of the Spine), without myelopathy or radiculopathy (Nerve Damage).   Care order/instruction: Please confirm that the patient has stopped the Coumadin (Warfarin) X 5 days prior to procedure or surgery.    Please confirm that the patient has stopped the Coumadin (Warfarin) X 5 days prior to procedure or surgery.    Standing Status:   Standing    Number of Occurrences:   1   Provide equipment / supplies at bedside    "Block Tray" (Disposable  single use) Needle type: SpinalSpinal Amount/quantity: 4 Size: Medium (5-inch) Gauge: 22G    Standing Status:   Standing    Number of Occurrences:   1    Order Specific Question:   Specify    Answer:   Block Tray   Bleeding precautions    Standing Status:   Standing    Number of Occurrences:   1   Latex precautions    Activate Latex-Free Protocol.    Standing Status:   Standing    Number of Occurrences:   1    Chronic Opioid Analgesic:  Hydrocodone/APAP 5/325, 1 tab PO QD PRN (5 mg/day of hydrocodone) (5 MME) +  tramadol 50 mg, 1 tab p.o. every 8 hours (150 mg/day of tramadol) (15 MME) MME: 20 mg/day.   Medications ordered for procedure: Meds ordered this encounter  Medications   lidocaine (XYLOCAINE) 2 % (with pres) injection 400 mg   lactated ringers infusion 1,000 mL   midazolam (VERSED) 5 MG/5ML injection 0.5-2 mg    Make sure Flumazenil is available in the pyxis when using this medication. If oversedation occurs, administer 0.2 mg IV over 15 sec. If after 45 sec no response, administer 0.2 mg again over 1 min; may repeat at 1 min intervals; not to exceed 4 doses (1 mg)   ropivacaine (PF) 2 mg/mL (0.2%) (NAROPIN) injection 18 mL   triamcinolone acetonide (KENALOG-40) injection 80 mg   ketorolac (TORADOL) injection 60 mg   orphenadrine (NORFLEX) injection 60 mg    Medications administered: We administered lidocaine, lactated ringers, midazolam, ropivacaine (PF) 2 mg/mL (0.2%), and triamcinolone acetonide.  See the medical record for exact dosing, route, and time of administration.  Follow-up plan:   Return in about 2 weeks (around 03/17/2021) for Proc-day (T,Th), (VV), (PPE).       Interventional Therapies  Risk  Complexity  Complicating Factors:   Estimated body mass index is 36.49 kg/m as calculated from the following:   Height as of this encounter: 5\' 3"  (1.6 m).   Weight as of this encounter: 206 lb (93.4 kg). 1.  Coumadin anticoagulation  (Stop: 5 days  Re-start: 2hrs) 2.  Latex allergy  3.  Morbid obesity (class III) (BMI>40)  4.  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   Under consideration:   Palliative procedures until BMI<35.   Completed:   Palliative/Therapeutic left L4-5 LESI x1 (11/10/2017) Palliative left lumbar facet block x14 (06/24/2020) Palliative right lumbar facet block(s) x19 (08/05/2020) Palliative right SI joint block x8 (not as effective as facet Blk)  (03/08/2019) Diagnostic left SI joint block x1 (not as effective as facet Blk) (03/08/2019) Palliative/Therapeutic (Midline) caudal ESI x3 (06/08/2018)   Palliative options:   Palliative/Therapeutic left L4-5 LESI #2   Palliative left lumbar facet block #15  Palliative right lumbar facet block(s) #19  Palliative right SI joint block #9  Diagnostic left SI joint block #2  Palliative/Therapeutic (Midline) caudal ESI #4      Recent Visits Date Type Provider Dept  02/24/21 Office Visit Milinda Pointer, MD [name]  12/30/20 Telemedicine Milinda Pointer, MD [name]  12/09/20 Procedure visit Milinda Pointer, MD [name]  Showing recent visits within past 90 days and meeting all other requirements Today's Visits Date Type Provider Dept  03/03/21 Procedure visit Milinda Pointer, MD [name]  Showing today's visits and meeting all other requirements Future Appointments Date Type Provider Dept  03/19/21 Appointment Milinda Pointer, MD [name]  Showing future appointments within next 90 days and meeting all other requirements Disposition: Discharge home  Discharge (Date  Time): 03/03/2021; 1213 hrs.   Primary Care Physician: Kara Harrier, MD Location: [facility] Note by: Kara Cola, MD Date: 03/03/2021; Time: 12:35 PM  Disclaimer:  Medicine is not an exact science. The only guarantee in medicine is that nothing is guaranteed. It is important to note that the decision to proceed with this intervention was based on the information collected from the patient. The Data and conclusions were drawn from the patient's questionnaire, the interview, and the physical examination. Because the information was provided in large part by the patient, it cannot be guaranteed that it has not been purposely or unconsciously manipulated. Every effort has been made to obtain as much  relevant data as possible for this evaluation. It is important to note that the conclusions that lead to this procedure are derived in large part from the available data. Always take into account that the treatment will also be dependent on availability of resources and existing treatment guidelines, considered by other Pain Management Practitioners as being common knowledge and practice, at the time of the intervention. For Medico-Legal purposes, it is also important to point out that variation in procedural techniques and pharmacological choices are the acceptable norm. The indications, contraindications, technique, and results of the above procedure should only be interpreted and judged by a Board-Certified Interventional Pain Specialist with extensive familiarity and expertise in the same exact procedure and technique.

## 2021-03-02 NOTE — Progress Notes (Signed)
Met with patient during follow up visit with Dr. Baruch Gouty. All questions answered during visit. No barriers or needs identified. Reviewed upcoming appts. Instructed to call with any questions or needs. Pt verbalized understanding.

## 2021-03-02 NOTE — Progress Notes (Signed)
Radiation Oncology Follow up Note  Name: Kara [name]   Date:   03/02/2021 MRN:  [id number] DOB: 07-20-1950    This 70 y.o. 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  REFERRING PROVIDER: Tracie Harrier, MD  HPI: Patient is a 70 year old female originally consulted at the end of September for a new lesion on CT scan of the right upper lobe which was PET positive.  We recommended bronchoscopy.  Which was performed showing adenocarcinoma with lipidic features.  She is seen today for consideration of SBRT.  She is doing well specifically [place] cough hemoptysis or chest tightness..  COMPLICATIONS OF TREATMENT: none  FOLLOW UP COMPLIANCE: keeps appointments   PHYSICAL EXAM:  BP 105/66   Pulse 96   Temp (!) 97.5 F (36.4 C) (Tympanic)   Resp 20   Wt 203 lb 4.8 oz (92.2 kg)   BMI 36.01 kg/m  Well-developed well-nourished patient in NAD. HEENT reveals PERLA, EOMI, discs not visualized.  Oral cavity is clear. No oral mucosal lesions are identified. Neck is clear without evidence of cervical or supraclavicular adenopathy. Lungs are clear to A&P. Cardiac examination is essentially unremarkable with regular rate and rhythm without murmur rub or thrill. Abdomen is benign with no organomegaly or masses noted. Motor sensory and DTR levels are equal and symmetric in the upper and lower extremities. Cranial nerves II through XII are grossly intact. Proprioception is intact. No peripheral adenopathy or edema is identified. No motor or sensory levels are noted. Crude visual fields are within normal range.  RADIOLOGY RESULTS: CT scan and PET CT scans reviewed  PLAN: This time elect to go ahead with SBRT.  Would plan on delivering 60 Gray in 5 fractions.  We will use motion restriction as well as 4-dimensional treatment planning during simulation.  Risks and benefits of treatment including  possible development of a cough about a month out.  Possible fatigue all were discussed in detail with the patient.  She comprehends her treatment plan well.  I have set up for simulation for next week.  I would like to take this opportunity to thank you for allowing me to participate in the care of your patient.Noreene Filbert, MD

## 2021-03-03 ENCOUNTER — Ambulatory Visit (HOSPITAL_BASED_OUTPATIENT_CLINIC_OR_DEPARTMENT_OTHER): Payer: PPO | Admitting: Pain Medicine

## 2021-03-03 ENCOUNTER — Ambulatory Visit
Admission: RE | Admit: 2021-03-03 | Discharge: 2021-03-03 | Disposition: A | Discharge status: Home / Self Care | Payer: PPO | Source: Ambulatory Visit | Attending: Pain Medicine | Admitting: Pain Medicine

## 2021-03-03 ENCOUNTER — Encounter: Payer: Self-pay | Admitting: Pain Medicine

## 2021-03-03 VITALS — BP 126/71 | HR 80 | Temp 97.2°F | Resp 16 | Ht 63.0 in | Wt 206.0 lb

## 2021-03-03 DIAGNOSIS — M4310 Spondylolisthesis, site unspecified: Secondary | ICD-10-CM | POA: Insufficient documentation

## 2021-03-03 DIAGNOSIS — Z7901 Long term (current) use of anticoagulants: Secondary | ICD-10-CM | POA: Insufficient documentation

## 2021-03-03 DIAGNOSIS — M47817 Spondylosis without myelopathy or radiculopathy, lumbosacral region: Secondary | ICD-10-CM | POA: Diagnosis not present

## 2021-03-03 DIAGNOSIS — M4306 Spondylolysis, lumbar region: Secondary | ICD-10-CM | POA: Diagnosis not present

## 2021-03-03 DIAGNOSIS — M79604 Pain in right leg: Secondary | ICD-10-CM | POA: Diagnosis not present

## 2021-03-03 DIAGNOSIS — M5137 Other intervertebral disc degeneration, lumbosacral region: Secondary | ICD-10-CM

## 2021-03-03 DIAGNOSIS — M79605 Pain in left leg: Secondary | ICD-10-CM | POA: Diagnosis not present

## 2021-03-03 DIAGNOSIS — M545 Low back pain, unspecified: Secondary | ICD-10-CM

## 2021-03-03 DIAGNOSIS — G894 Chronic pain syndrome: Secondary | ICD-10-CM | POA: Diagnosis not present

## 2021-03-03 DIAGNOSIS — Z79891 Long term (current) use of opiate analgesic: Secondary | ICD-10-CM | POA: Diagnosis not present

## 2021-03-03 DIAGNOSIS — G8929 Other chronic pain: Secondary | ICD-10-CM

## 2021-03-03 DIAGNOSIS — Z91040 Latex allergy status: Secondary | ICD-10-CM | POA: Insufficient documentation

## 2021-03-03 DIAGNOSIS — Z79899 Other long term (current) drug therapy: Secondary | ICD-10-CM

## 2021-03-03 DIAGNOSIS — M47816 Spondylosis without myelopathy or radiculopathy, lumbar region: Secondary | ICD-10-CM | POA: Diagnosis not present

## 2021-03-03 DIAGNOSIS — M4300 Spondylolysis, site unspecified: Secondary | ICD-10-CM | POA: Insufficient documentation

## 2021-03-03 DIAGNOSIS — M51379 Other intervertebral disc degeneration, lumbosacral region without mention of lumbar back pain or lower extremity pain: Secondary | ICD-10-CM

## 2021-03-03 MED ORDER — LACTATED RINGERS IV SOLN
1000.0000 mL | Freq: Once | INTRAVENOUS | Status: AC
  Administered 2021-03-03: 1000 mL via INTRAVENOUS

## 2021-03-03 MED ORDER — TRIAMCINOLONE ACETONIDE 40 MG/ML IJ SUSP
80.0000 mg | Freq: Once | INTRAMUSCULAR | Status: AC
Start: 2021-03-03 — End: 2021-03-03
  Administered 2021-03-03: 80 mg
  Filled 2021-03-03: qty 2

## 2021-03-03 MED ORDER — KETOROLAC TROMETHAMINE 60 MG/2ML IM SOLN
INTRAMUSCULAR | Status: AC
  Filled 2021-03-03: qty 2

## 2021-03-03 MED ORDER — ORPHENADRINE CITRATE 30 MG/ML IJ SOLN
INTRAMUSCULAR | Status: AC
  Filled 2021-03-03: qty 2

## 2021-03-03 MED ORDER — MIDAZOLAM HCL 5 MG/5ML IJ SOLN
0.5000 mg | Freq: Once | INTRAMUSCULAR | Status: AC
  Administered 2021-03-03: 2 mg via INTRAVENOUS
  Filled 2021-03-03: qty 5

## 2021-03-03 MED ORDER — ROPIVACAINE HCL 2 MG/ML IJ SOLN
INTRAMUSCULAR | Status: AC
  Filled 2021-03-03: qty 20

## 2021-03-03 MED ORDER — ROPIVACAINE HCL 2 MG/ML IJ SOLN
18.0000 mL | Freq: Once | INTRAMUSCULAR | Status: AC
  Administered 2021-03-03: 18 mL via PERINEURAL

## 2021-03-03 MED ORDER — ORPHENADRINE CITRATE 30 MG/ML IJ SOLN: 60.0000 mg | Freq: Once | INTRAMUSCULAR | Status: DC

## 2021-03-03 MED ORDER — KETOROLAC TROMETHAMINE 60 MG/2ML IM SOLN: 60.0000 mg | Freq: Once | INTRAMUSCULAR | Status: DC

## 2021-03-03 MED ORDER — LIDOCAINE HCL 2 % IJ SOLN
20.0000 mL | Freq: Once | INTRAMUSCULAR | Status: AC
  Administered 2021-03-03: 400 mg
  Filled 2021-03-03: qty 40

## 2021-03-03 NOTE — Progress Notes (Signed)
Pt pain was at zero while laying in the bed. After assisting her to the wheelchair, she complained of pain in her legs and pain score of 7 was given. Dr. Lowella Dandy was notified of pt condition. She was given Orphenadrine 60mg  and Ketoroliac Tromethamine 60mg  per order.

## 2021-03-03 NOTE — Patient Instructions (Addendum)
____________________________________________________________________________________________  Virtual Visits   What is a "Virtual Visit"? It is a Metallurgist (medical visit) that takes place on real time (NOT TEXT or E-MAIL) over the telephone or computer device (desktop, laptop, tablet, smart phone, etc.). It allows for more location flexibility between the patient and the healthcare provider.  Who decides when these types of visits will be used? The physician.  Who is eligible for these types of visits? Only those patients that can be reliably reached over the telephone.  What do you mean by reliably? We do not have time to call everyone multiple times, therefore those that tend to screen calls and then call back later are not suitable candidates for this system. We understand how people are reluctant to pickup on "unknown" calls, therefore, we suggest adding our telephone numbers to your list of "CONTACT(s)". This way, you should be able to readily identify our calls when you receive one. All of our numbers are available below.   Who is not eligible? This option is not available for medication management encounters, specially for controlled substances. Patients on pain medications that fall under the category of controlled substances have to come in for "Face-to-Face" encounters. This is required for mandatory monitoring of these substances. You may be asked to provide a sample for an unannounced urine drug screening test (UDS), and we will need to count your pain pills. Not bringing your pills to be counted may result in no refill. Obviously, neither one of these can be done over the phone.  When will this type of visits be used? You can request a virtual visit whenever you are physically unable to attend a regular appointment. The decision will be made by the physician (or healthcare provider) on a case by case basis.   At what time will I be called? This is an  excellent question. The providers will try to call you whenever they have time available. Do not expect to be called at any specific time. The secretaries will assign you a time for your virtual visit appointment, but this is done simply to keep a list of those patients that need to be called, but not for the purpose of keeping a time schedule. Be advised that the call may come in anytime during the day, between the hours of 8:00 AM and 8::00 PM, depending on provider availability. We do understand that the system is not perfect. If you are unable to be available that day on a moments notice, then request an "in-person" appointment rather than a "virtual visit".  Can I request my medication visits to be "Virtual"? Yes you may request it, but the decision is entirely up to the healthcare provider. Control substances require specific monitoring that requires Face-to-Face encounters. The number of encounters  and the extent of the monitoring is determined on a case by case basis.  Add a new contact to your smart phone and label it "PAIN CLINIC" Under this contact add the following numbers: Main: [phone] (Official Contact Number) Nurses: [phone] (These are outgoing only calling systems. Do not call this number.) Dr. Dossie Arbour: [phone] or [phone] (Outgoing calls only. Do not call this number.)  ____________________________________________________________________________________________  ____________________________________________________________________________________________  Medication Rules  Purpose: To inform patients, and their family members, of our rules and regulations.  Applies to: All patients receiving prescriptions (written or electronic).  Pharmacy of record: Pharmacy where electronic prescriptions will be sent. If written prescriptions are taken to a different pharmacy, please inform  the nursing staff. The pharmacy listed in the electronic medical record  should be the one where you would like electronic prescriptions to be sent.  Electronic prescriptions: In compliance with the [place] (STOP) Act of 2017 (Session Lanny Cramp [phone]), effective May 24, 2018, all controlled substances must be electronically prescribed. Calling prescriptions to the pharmacy will cease to exist.  Prescription refills: Only during scheduled appointments. Applies to all prescriptions.  NOTE: The following applies primarily to controlled substances (Opioid* Pain Medications).   Type of encounter (visit): For patients receiving controlled substances, face-to-face visits are required. (Not an option or up to the patient.)  Patient's responsibilities: Pain Pills: Bring all pain pills to every appointment (except for procedure appointments). Pill Bottles: Bring pills in original pharmacy bottle. Always bring the newest bottle. Bring bottle, even if empty. Medication refills: You are responsible for knowing and keeping track of what medications you take and those you need refilled. The day before your appointment: write a list of all prescriptions that need to be refilled. The day of the appointment: give the list to the admitting nurse. Prescriptions will be written only during appointments. No prescriptions will be written on procedure days. If you forget a medication: it will not be "Called in", "Faxed", or "electronically sent". You will need to get another appointment to get these prescribed. No early refills. Do not call asking to have your prescription filled early. Prescription Accuracy: You are responsible for carefully inspecting your prescriptions before leaving our office. Have the discharge nurse carefully go over each prescription with you, before taking them home. Make sure that your name is accurately spelled, that your address is correct. Check the name and dose of your medication to make sure it is accurate. Check  the number of pills, and the written instructions to make sure they are clear and accurate. Make sure that you are given enough medication to last until your next medication refill appointment. Taking Medication: Take medication as prescribed. When it comes to controlled substances, taking less pills or less frequently than prescribed is permitted and encouraged. Never take more pills than instructed. Never take medication more frequently than prescribed.  Inform other Doctors: Always inform, all of your healthcare providers, of all the medications you take. Pain Medication from other Providers: You are not allowed to accept any additional pain medication from any other Doctor or Healthcare provider. There are two exceptions to this rule. (see below) In the event that you require additional pain medication, you are responsible for notifying us, as stated below. Cough Medicine: Often these contain an opioid, such as codeine or hydrocodone. Never accept or take cough medicine containing these opioids if you are already taking an opioid* medication. The combination may cause respiratory failure and death. Medication Agreement: You are responsible for carefully reading and following our Medication Agreement. This must be signed before receiving any prescriptions from our practice. Safely store a copy of your signed Agreement. Violations to the Agreement will result in no further prescriptions. (Additional copies of our Medication Agreement are available upon request.) Laws, Rules, & Regulations: All patients are expected to follow all Federal and [employer], [employer], Rules, [employer]. Ignorance of the Laws does not constitute a valid excuse.  Illegal drugs and Controlled Substances: The use of illegal substances (including, but not limited to marijuana and its derivatives) and/or the illegal use of any controlled substances is strictly prohibited. Violation of this rule may result in the immediate and  permanent discontinuation of any and all prescriptions being written by our practice. The use of any illegal substances is prohibited. Adopted CDC guidelines & recommendations: Target dosing levels will be at or below 60 MME/day. Use of benzodiazepines** is not recommended.  Exceptions: There are only two exceptions to the rule of not receiving pain medications from other Healthcare Providers. Exception #1 (Emergencies): In the event of an emergency (i.e.: accident requiring emergency care), you are allowed to receive additional pain medication. However, you are responsible for: As soon as you are able, call our office [phone], at any time of the day or night, and leave a message stating your name, the date and nature of the emergency, and the name and dose of the medication prescribed. In the event that your call is answered by a member of our staff, make sure to document and save the date, time, and the name of the person that took your information.  Exception #2 (Planned Surgery): In the event that you are scheduled by another doctor or dentist to have any type of surgery or procedure, you are allowed (for a period no longer than 30 days), to receive additional pain medication, for the acute post-op pain. However, in this case, you are responsible for picking up a copy of our "Post-op Pain Management for Surgeons" handout, and giving it to your surgeon or dentist. This document is available at our office, and does not require an appointment to obtain it. Simply go to our office during business hours (Monday-Thursday from 8:00 AM to 4:00 PM) (Friday 8:00 AM to 12:00 Noon) or if you have a scheduled appointment with [place], prior to your surgery, and ask for it by name. In addition, you are responsible for: calling our office [phone], at any time of the day or night, and leaving a message stating your name, name of your surgeon, type of surgery, and date of procedure or surgery. Failure to comply  with your responsibilities may result in termination of therapy involving the controlled substances. Medication Agreement Violation. Following the above rules, including your responsibilities will help you in avoiding a Medication Agreement Violation ("Breaking your Pain Medication Contract").  *Opioid medications include: morphine, codeine, oxycodone, oxymorphone, hydrocodone, hydromorphone, meperidine, tramadol, tapentadol, buprenorphine, fentanyl, methadone. **Benzodiazepine medications include: diazepam (Valium), alprazolam (Xanax), clonazepam (Klonopine), lorazepam (Ativan), clorazepate (Tranxene), chlordiazepoxide (Librium), estazolam (Prosom), oxazepam (Serax), temazepam (Restoril), triazolam (Halcion) (Last updated: 02/18/2021) ____________________________________________________________________________________________ ____________________________________________________________________________________________  Medication Recommendations and Reminders  Applies to: All patients receiving prescriptions (written and/or electronic).  Medication Rules & Regulations: These rules and regulations exist for your safety and that of others. They are not flexible and neither are we. Dismissing or ignoring them will be considered "non-compliance" with medication therapy, resulting in complete and irreversible termination of such therapy. (See document titled "Medication Rules" for more details.) In all conscience, because of safety reasons, we cannot continue providing a therapy where the patient does not follow instructions.  Pharmacy of record:  Definition: This is the pharmacy where your electronic prescriptions will be sent.  We do not endorse any particular pharmacy, however, we have experienced problems with [facility] not securing enough medication supply for the community. We do not restrict you in your choice of pharmacy. However, once we write for your prescriptions, we will NOT be re-sending  more prescriptions to fix restricted supply problems created by your pharmacy, or your insurance.  The pharmacy listed in the electronic medical record should be the one where you want electronic prescriptions to  be sent. If you choose to change pharmacy, simply notify our nursing staff.  Recommendations: Keep all of your pain medications in a safe place, under lock and key, even if you live alone. We will NOT replace lost, stolen, or damaged medication. After you fill your prescription, take 1 week's worth of pills and put them away in a safe place. You should keep a separate, properly labeled bottle for this purpose. The remainder should be kept in the original bottle. Use this as your primary supply, until it runs out. Once it's gone, then you know that you have 1 week's worth of medicine, and it is time to come in for a prescription refill. If you do this correctly, it is unlikely that you will ever run out of medicine. To make sure that the above recommendation works, it is very important that you make sure your medication refill appointments are scheduled at least 1 week before you run out of medicine. To do this in an effective manner, make sure that you do not leave the office without scheduling your next medication management appointment. Always ask the nursing staff to show you in your prescription , when your medication will be running out. Then arrange for the receptionist to get you a return appointment, at least 7 days before you run out of medicine. Do not wait until you have 1 or 2 pills left, to come in. This is very poor planning and does not take into consideration that we may need to cancel appointments due to bad weather, sickness, or emergencies affecting our staff. DO NOT ACCEPT A "Partial Fill": If for any reason your pharmacy does not have enough pills/tablets to completely fill or refill your prescription, do not allow for a "partial fill". The law allows the pharmacy to complete  that prescription within 72 hours, without requiring a new prescription. If they do not fill the rest of your prescription within those 72 hours, you will need a separate prescription to fill the remaining amount, which we will NOT provide. If the reason for the partial fill is your insurance, you will need to talk to the pharmacist about payment alternatives for the remaining tablets, but again, DO NOT ACCEPT A PARTIAL FILL, unless you can trust your pharmacist to obtain the remainder of the pills within 72 hours.  Prescription refills and/or changes in medication(s):  Prescription refills, and/or changes in dose or medication, will be conducted only during scheduled medication management appointments. (Applies to both, written and electronic prescriptions.) No refills on procedure days. No medication will be changed or started on procedure days. No changes, adjustments, and/or refills will be conducted on a procedure day. Doing so will interfere with the diagnostic portion of the procedure. No phone refills. No medications will be "called into the pharmacy". No Fax refills. No weekend refills. No Holliday refills. No after hours refills.  Remember:  Business hours are:  Monday to Thursday 8:00 AM to 4:00 PM Provider's Schedule: Milinda Pointer, MD - Appointments are:  Medication management: Monday and Wednesday 8:00 AM to 4:00 PM Procedure day: Tuesday and Thursday 7:30 AM to 4:00 PM Gillis Santa, MD - Appointments are:  Medication management: Tuesday and Thursday 8:00 AM to 4:00 PM Procedure day: Monday and Wednesday 7:30 AM to 4:00 PM (Last update: 12/12/2019) ____________________________________________________________________________________________

## 2021-03-04 ENCOUNTER — Telehealth: Payer: Self-pay

## 2021-03-04 NOTE — Telephone Encounter (Signed)
Called PP.[facility]o call if needed.

## 2021-03-05 ENCOUNTER — Other Ambulatory Visit: Payer: PPO

## 2021-03-05 NOTE — Progress Notes (Signed)
Tumor Board Documentation  Kara [name] was presented by Dr Janese Banks at our Tumor Board on 03/05/2021, which included representatives from medical oncology, radiation oncology, internal medicine, navigation, pathology, radiology, surgical, genetics, pulmonology, research, pharmacy.  Kara [name] currently presents as a current patient, for [place], for new positive pathology with history of the following treatments: surgical intervention(s).  Additionally, we reviewed previous medical and familial history, history of present illness, and recent lab results along with all available histopathologic and imaging studies. The tumor board considered available treatment options and made the following recommendations: Radiation therapy (primary modality) (SBRT) Patient does not want surgery  The following procedures/referrals were also placed: No orders of the defined types were placed in this encounter.   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  Tumor board is a meeting of clinicians from various specialty areas who evaluate and discuss patients for whom a multidisciplinary approach is being considered. Final determinations in the plan of care are those of the provider(s). The responsibility for follow up of recommendations given during tumor board is that of the provider.   Today's extended care, comprehensive team conference, Kara [name] was not present for the discussion and was not examined.   Multidisciplinary Tumor Board is a multidisciplinary case peer review process.  Decisions discussed in the Multidisciplinary Tumor Board reflect the opinions of the specialists present at the conference without having examined the patient.  Ultimately, treatment and diagnostic decisions rest with the primary provider(s) and the patient.

## 2021-03-11 ENCOUNTER — Ambulatory Visit
Admission: RE | Admit: 2021-03-11 | Discharge: 2021-03-11 | Disposition: A | Discharge status: Home / Self Care | Payer: PPO | Source: Ambulatory Visit | Attending: Radiation Oncology | Admitting: Radiation Oncology

## 2021-03-11 ENCOUNTER — Encounter: Payer: Self-pay | Admitting: *Deleted

## 2021-03-11 DIAGNOSIS — C3411 Malignant neoplasm of upper lobe, right bronchus or lung: Secondary | ICD-10-CM | POA: Diagnosis not present

## 2021-03-11 DIAGNOSIS — R918 Other nonspecific abnormal finding of lung field: Secondary | ICD-10-CM | POA: Diagnosis not present

## 2021-03-18 ENCOUNTER — Telehealth: Payer: PPO | Admitting: Pain Medicine

## 2021-03-19 ENCOUNTER — Telehealth: Payer: PPO | Admitting: Pain Medicine

## 2021-03-19 DIAGNOSIS — C3411 Malignant neoplasm of upper lobe, right bronchus or lung: Secondary | ICD-10-CM | POA: Diagnosis not present

## 2021-03-24 ENCOUNTER — Ambulatory Visit
Admission: RE | Admit: 2021-03-24 | Discharge: 2021-03-24 | Disposition: A | Discharge status: Home / Self Care | Payer: PPO | Source: Ambulatory Visit | Attending: Radiation Oncology | Admitting: Radiation Oncology

## 2021-03-24 ENCOUNTER — Encounter: Payer: Self-pay | Admitting: *Deleted

## 2021-03-24 DIAGNOSIS — C3411 Malignant neoplasm of upper lobe, right bronchus or lung: Secondary | ICD-10-CM | POA: Diagnosis not present

## 2021-03-26 ENCOUNTER — Ambulatory Visit
Admission: RE | Admit: 2021-03-26 | Discharge: 2021-03-26 | Disposition: A | Discharge status: Home / Self Care | Payer: PPO | Source: Ambulatory Visit | Attending: Radiation Oncology | Admitting: Radiation Oncology

## 2021-03-26 DIAGNOSIS — C3411 Malignant neoplasm of upper lobe, right bronchus or lung: Secondary | ICD-10-CM | POA: Diagnosis not present

## 2021-03-31 ENCOUNTER — Ambulatory Visit
Admission: RE | Admit: 2021-03-31 | Discharge: 2021-03-31 | Disposition: A | Discharge status: Home / Self Care | Payer: PPO | Source: Ambulatory Visit | Attending: Radiation Oncology | Admitting: Radiation Oncology

## 2021-03-31 DIAGNOSIS — C3411 Malignant neoplasm of upper lobe, right bronchus or lung: Secondary | ICD-10-CM | POA: Diagnosis not present

## 2021-04-02 ENCOUNTER — Ambulatory Visit
Admission: RE | Admit: 2021-04-02 | Discharge: 2021-04-02 | Disposition: A | Discharge status: Home / Self Care | Payer: PPO | Source: Ambulatory Visit | Attending: Radiation Oncology | Admitting: Radiation Oncology

## 2021-04-02 DIAGNOSIS — C3411 Malignant neoplasm of upper lobe, right bronchus or lung: Secondary | ICD-10-CM | POA: Diagnosis not present

## 2021-04-02 DIAGNOSIS — R791 Abnormal coagulation profile: Secondary | ICD-10-CM | POA: Diagnosis not present

## 2021-04-06 NOTE — Progress Notes (Signed)
Unsuccessful attempt to contact patient for Virtual Visit (Pain Management Telehealth)   Patient provided contact information:  [phone] (home); [phone] (mobile); (Preferred) [phone] [email or web address]   Pre-screening:  Our staff was successful in contacting Kara [name] using the above provided information.   I unsuccessfully attempted to make contact with Kara [name] on 04/07/2021 via telephone. I was unable to complete the virtual encounter due to call going directly to voicemail. I was able to leave a message where I clearly identify myself as Gaspar Cola, MD and I left a message to call us back to reschedule the call.  Post-Procedure Evaluation  Procedure (03/03/2021):  Procedure:           Anesthesia, Analgesia, Anxiolysis:  Type: Lumbar Facet, Medial Branch Block(s)          Primary Purpose: Diagnostic Region: Posterolateral Lumbosacral Spine Level: L2, L3, L4, L5, & S1 Medial Branch Level(s). Injecting these levels blocks the L3-4, L4-5, and L5-S1 lumbar facet joints. Laterality: Bilateral   Type: Local Anesthesia Local Anesthetic: Lidocaine 1-2% Sedation: Minimal Anxiolysis  Indication(s): Anxiety & Analgesia Route: Infiltration ([place]/IM) IV Access: Available     Position: Prone    Indications: 1. Lumbar facet syndrome (Bilateral)   2. Spondylosis without myelopathy or radiculopathy, lumbosacral region   3. 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 Lumbar facet arthropathy (L3-4, L4-5, and L5-S1) (Bilateral)   5. 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 Osteoarthritis of lumbar spine   7. 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 DDD (degenerative disc disease), lumbosacral   9. Chronic low back pain (1ry area of Pain) (Bilateral) w/o sciatica   10. Latex precautions, history of latex allergy   11. Chronic anticoagulation (COUMADIN)   12. Severe obesity (BMI 35.0-39.9) with comorbidity (HCC)     Pain  Score: Pre-procedure: 10-Worst pain ever/10 Post-procedure: 7  (pain started after she stood up)/10    Anxiolysis: Please see nurses note.  Effectiveness during initial hour after procedure (Ultra-Short Term Relief): 100 %.  Local anesthetic used: Long-acting (4-6 hours) Effectiveness: Defined as any analgesic benefit obtained secondary to the administration of local anesthetics. This carries significant diagnostic value as to the etiological location, or anatomical origin, of the pain. Duration of benefit is expected to coincide with the duration of the local anesthetic used.  Effectiveness during initial 4-6 hours after procedure (Short-Term Relief): 100 %.  Long-term benefit: Defined as any relief past the pharmacologic duration of the local anesthetics.  Effectiveness past the initial 6 hours after procedure (Long-Term Relief): 75 % (2 weeks).   Pharmacotherapy Assessment  Analgesic: Oxycodone IR 10 mg, 1 tab PO qd,  from PCP. No chronic opioid analgesics therapy prescribed by our practice, 2ry to Medication Agreement Violation. (Getting oxycodone from PCP in multiple occasions, according to PMP)(11/21/20; 12/26/20; 01/16/21; 02/13/21). Opioid analgesic pharmacotherapy D/C'ed on 03/03/2021. MME: 20 mg/day.   Follow-up plan:   Reschedule Visit.    Interventional Therapies  Risk  Complexity  Complicating Factors:   Estimated body mass index is 36.49 kg/m as calculated from the following:   Height as of this encounter: 5\' 3"  (1.6 m).   Weight as of this encounter: 206 lb (93.4 kg). 1.  Coumadin anticoagulation  (Stop: 5 days  Re-start: 2hrs) 2.  Latex allergy  3.  Morbid obesity (class III) (BMI>40)  4.  No Lumbar RFA due to morbid obesity    Planned  Pending:   Therapeutic  radiofrequency ablation of the lumbar facets once her BMI is below 35.   Under consideration:   Palliative procedures until BMI<35.   Completed:   Palliative/Therapeutic left L4-5 LESI x1  (11/10/2017) Palliative left lumbar facet block x14 (06/24/2020) Palliative right lumbar facet block(s) x19 (08/05/2020) Palliative right SI joint block x8 (not as effective as facet Blk) (03/08/2019) Diagnostic left SI joint block x1 (not as effective as facet Blk) (03/08/2019) Palliative/Therapeutic (Midline) caudal ESI x3 (06/08/2018)   Palliative options:   Palliative/Therapeutic left L4-5 LESI #2   Palliative left lumbar facet block #15  Palliative right lumbar facet block(s) #19  Palliative right SI joint block #9  Diagnostic left SI joint block #2  Palliative/Therapeutic (Midline) caudal ESI #4       Recent Visits Date Type Provider Dept  03/03/21 Procedure visit Milinda Pointer, MD [name]  02/24/21 Office Visit Milinda Pointer, MD [name]  Showing recent visits within past 90 days and meeting all other requirements Today's Visits Date Type Provider Dept  04/07/21 Office Visit Milinda Pointer, MD [name]  Showing today's visits and meeting all other requirements Future Appointments No visits were found meeting these conditions. Showing future appointments within next 90 days and meeting all other requirements  Note by: Gaspar Cola, MD Date: 04/07/2021; Time: 4:42 PM

## 2021-04-07 ENCOUNTER — Ambulatory Visit
Admission: RE | Admit: 2021-04-07 | Discharge: 2021-04-07 | Disposition: A | Discharge status: Home / Self Care | Payer: PPO | Source: Ambulatory Visit | Attending: Radiation Oncology | Admitting: Radiation Oncology

## 2021-04-07 ENCOUNTER — Ambulatory Visit: Payer: PPO | Attending: Pain Medicine | Admitting: Pain Medicine

## 2021-04-07 DIAGNOSIS — M4310 Spondylolisthesis, site unspecified: Secondary | ICD-10-CM

## 2021-04-07 DIAGNOSIS — M545 Low back pain, unspecified: Secondary | ICD-10-CM

## 2021-04-07 DIAGNOSIS — C3411 Malignant neoplasm of upper lobe, right bronchus or lung: Secondary | ICD-10-CM | POA: Diagnosis not present

## 2021-04-07 DIAGNOSIS — G8929 Other chronic pain: Secondary | ICD-10-CM

## 2021-04-07 DIAGNOSIS — M4306 Spondylolysis, lumbar region: Secondary | ICD-10-CM

## 2021-04-07 DIAGNOSIS — M47816 Spondylosis without myelopathy or radiculopathy, lumbar region: Secondary | ICD-10-CM

## 2021-04-15 ENCOUNTER — Telehealth: Payer: Self-pay

## 2021-04-15 NOTE — Telephone Encounter (Signed)
Called patient to review Meds for Mondays VV. No answer, left message to call us.

## 2021-04-17 NOTE — Progress Notes (Signed)
Patient: Kara [name]  Service Category: E/M  Provider: Gaspar Cola, MD  DOB: 08-18-1950  DOS: 04/20/2021  Location: Office  MRN: [id number]  Setting: Ambulatory outpatient  Referring Provider: Tracie Harrier, MD  Type: Established Patient  Specialty: Interventional Pain Management  PCP: Tracie Harrier, MD  Location: Remote location  Delivery: TeleHealth     Virtual Encounter - Pain Management PROVIDER NOTE: Information contained herein reflects review and annotations entered in association with encounter. Interpretation of such information and data should be left to medically-trained personnel. Information provided to patient can be located elsewhere in the medical record under "Patient Instructions". Document created using STT-dictation technology, any transcriptional errors that may result from process are unintentional.    Contact & Pharmacy Preferred: [phone] Home: [phone] (home) Mobile: [phone] (mobile) E-mail: [email or web address]  [place], [place] [place] [address] Phone: [phone] Fax: [phone]   Pre-screening  Kara [name] offered "in-person" vs "virtual" encounter. She indicated preferring virtual for this encounter.   Reason COVID-19*  Social distancing based on CDC and AMA recommendations.   I contacted Kara [name] on 04/20/2021 via telephone.      I clearly identified myself as Gaspar Cola, MD. I verified that I was speaking with the correct person using two identifiers (Name: Kara [name], and date of birth: 07-Sep-1950).  Consent I sought verbal advanced consent from Kara [name] for virtual visit interactions. I informed Kara [name] of possible security and privacy concerns, risks, and limitations associated with providing "not-in-person" medical evaluation and management services. I also informed Kara [name] of the availability of "in-person"  appointments. Finally, I informed her that there would be a charge for the virtual visit and that she could be  personally, fully or partially, financially responsible for it. Kara [name] expressed understanding and agreed to proceed.   Historic Elements   Kara [name] is a 70 y.o. year old, female patient evaluated today after our last contact on 04/07/2021. 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 She also  has a past surgical history that includes Knee surgery; Carpal tunnel release (Bilateral); Colon resection sigmoid (N/A, 11/02/2016); Colostomy (N/A, 11/02/2016); Sigmoidoscopy (N/A, 11/13/2016); PULMONARY VENOGRAPHY (N/A, 11/19/2016); Colonoscopy with propofol (N/A, 02/03/2017); Colon surgery (11/02/2016); IVC FILTER INSERTION (Right, 10/2016); Colostomy reversal (N/A, 02/24/2017); Appendectomy (N/A, 02/24/2017); Lysis of adhesion (N/A, 02/24/2017); laparoscopy (N/A, 02/24/2017); Ileo loop diversion (N/A, 02/24/2017); Sigmoidoscopy (N/A, 05/19/2017); Ileostomy closure (N/A, 06/01/2017); IVC FILTER REMOVAL (N/A, 08/09/2017); Sacroplasty (N/A, 11/08/2017); Cataract extraction w/PHACO (Left, 11/21/2018); Cataract extraction w/PHACO (Right, 12/12/2018); and Video bronchoscopy with endobronchial navigation (N/A, 02/23/2021). Kara [name] has a current medication list which includes the following prescription(s): alendronate, calcium carbonate, candesartan, cholecalciferol, vitamin b-12, cyclobenzaprine, dulaglutide, gabapentin, gabapentin, glipizide, glipizide, hydrocortisone, meclizine, multivitamin with minerals, ropinirole, tolterodine, warfarin, and warfarin. She  reports that she has never smoked. She has never used smokeless  tobacco. She reports that she does not drink alcohol and does not use drugs. Kara [name] is allergic to adhesive [tape], other, latex, and morphine and related.   HPI  Today, she is being contacted for worsening of previously known (established) problem.  She indicates that the procedure that we did on 03/03/2021 is wearing off and she would like to  have it repeated.  She stated that she was unable to connect with office around 04/07/2021 secondary to the fact that her patient apparently had to be hospitalized and she was assisting with that.  Because of the increased work that she has been doing, her pain in the lower back is returning.  Today she also talked to me about the medications indicating that it had been 2 to 3 weeks since she last took any oxycodone.  However the PMP also indicates that on 03/31/2021 she had a prescription for 60 oxycodone IR 10 mg tablets filled.  I have communicated to the patient that I will not be prescribing any more of the tramadol since she has continued to get pain medication from all other sources.  Post-Procedure Evaluation  Procedure (03/03/2021):  Procedure:           Anesthesia, Analgesia, Anxiolysis:  Type: Lumbar Facet, Medial Branch Block(s)          Primary Purpose: Diagnostic Region: Posterolateral Lumbosacral Spine Level: L2, L3, L4, L5, & S1 Medial Branch Level(s). Injecting these levels blocks the L3-4, L4-5, and L5-S1 lumbar facet joints. Laterality: Bilateral   Type: Local Anesthesia Local Anesthetic: Lidocaine 1-2% Sedation: Minimal Anxiolysis  Indication(s): Anxiety & Analgesia Route: Infiltration ([place]/IM) IV Access: Available     Position: Prone    Indications: 1. Lumbar facet syndrome (Bilateral)   2. Spondylosis without myelopathy or radiculopathy, lumbosacral region   3. 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 Lumbar facet arthropathy (L3-4, L4-5, and L5-S1) (Bilateral)   5. 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  Osteoarthritis of lumbar spine   7. 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 DDD (degenerative disc disease), lumbosacral   9. Chronic low back pain (1ry area of Pain) (Bilateral) w/o sciatica   10. Latex precautions, history of latex allergy   11. Chronic anticoagulation (COUMADIN)   12. Severe obesity (BMI 35.0-39.9) with comorbidity (HCC)     Pain Score: Pre-procedure: 10-Worst pain ever/10 Post-procedure: 7  (pain started after she stood up)/10    Anxiolysis: Please see nurses note.  Effectiveness during initial hour after procedure (Ultra-Short Term Relief): 100 %.   Local anesthetic used: Long-acting (4-6 hours) Effectiveness: Defined as any analgesic benefit obtained secondary to the administration of local anesthetics. This carries significant diagnostic value as to the etiological location, or anatomical origin, of the pain. Duration of benefit is expected to coincide with the duration of the local anesthetic used.  Effectiveness during initial 4-6 hours after procedure (Short-Term Relief): 100 %.   Long-term benefit: Defined as any relief past the pharmacologic duration of the local anesthetics.  Effectiveness past the initial 6 hours after procedure (Long-Term Relief): 75 % (2 weeks).  Benefits, current: Defined as benefit present at the time of this evaluation.   Analgesia: The patient indicates that by now the benefits of the 03/03/2021 procedure have worn off and she would like to have it repeated.  I have reminded the patient that we need to put at least 2 months between procedures. Function: Somewhat improved ROM: Somewhat improved  Pharmacotherapy Assessment   Analgesic: Oxycodone IR 10 mg, 1 tab PO qd,  from PCP. No chronic opioid analgesics therapy prescribed by our practice, 2ry to Medication Agreement Violation. (Getting oxycodone from PCP in multiple occasions, according to PMP)(11/21/20; 12/26/20; 01/16/21; 02/13/21). Opioid analgesic  pharmacotherapy D/C'ed on 03/03/2021. MME: 20 mg/day.   Monitoring: [place] PMP: PDMP reviewed  during this encounter.       Pharmacotherapy: No side-effects or adverse reactions reported. Compliance: No problems identified. Effectiveness: Clinically acceptable. Plan: Refer to "POC". UDS:  Summary  Date Value Ref Range Status  11/12/2020 Note  Final    Comment:    ==================================================================== [employer] Select 13 (MW) ==================================================================== Test                             Result       Flag       Units  Drug Present and Declared for Prescription Verification   Norhydrocodone                 61           EXPECTED   ng/mg creat    Norhydrocodone is an expected metabolite of hydrocodone.    Tramadol                       >4202        EXPECTED   ng/mg creat   O-Desmethyltramadol            >4202        EXPECTED   ng/mg creat   N-Desmethyltramadol            1712         EXPECTED   ng/mg creat    Source of tramadol is a prescription medication. O-desmethyltramadol    and N-desmethyltramadol are expected metabolites of tramadol.  Drug Absent but Declared for Prescription Verification   Hydrocodone                    Not Detected UNEXPECTED ng/mg creat    Hydrocodone is almost always present in patients taking this drug    consistently. Absence of hydrocodone could be due to lapse of time    since the last dose or unusual pharmacokinetics (rapid metabolism).  ==================================================================== Test                      Result    Flag   Units      Ref Range   Creatinine              119              mg/dL      >=20 ==================================================================== Declared Medications:  The flagging and interpretation on this report are based on the  following declared medications.  Unexpected results may arise from  inaccuracies in the declared  medications.   **Note: The testing scope of this panel includes these medications:   Hydrocodone  Tramadol   **Note: The testing scope of this panel does not include the  following reported medications:   Acetaminophen  Alendronate (Fosamax)  Aspirin  Calcium  Candesartan  Cholecalciferol  Cyanocobalamin  Cyclobenzaprine  Dulaglutide  Gabapentin  Glipizide  Meclizine  Multivitamin  Nystatin  Ondansetron  Ropinirole  Tolterodine  Warfarin ==================================================================== For clinical consultation, please call [phone]. ====================================================================      Laboratory Chemistry Profile   Renal Lab Results  Component Value Date   BUN 16 02/19/2021   CREATININE 0.70 02/19/2021   GFRAA >60 07/09/2019   GFRNONAA >60 02/19/2021    Hepatic Lab Results  Component Value Date   AST 23 02/19/2021   ALT 27 02/19/2021   ALBUMIN 3.6 02/19/2021   ALKPHOS 43 02/19/2021   LIPASE 38  08/11/2020    Electrolytes Lab Results  Component Value Date   NA 139 02/19/2021   K 3.5 02/19/2021   CL 106 02/19/2021   CALCIUM 9.0 02/19/2021   MG 1.7 08/14/2020   PHOS 3.3 11/07/2016    Bone Lab Results  Component Value Date   25OHVITD1 25 (L) 11/02/2017   25OHVITD2 <1.0 11/02/2017   25OHVITD3 25 11/02/2017    Inflammation (CRP: Acute Phase) (ESR: Chronic Phase) Lab Results  Component Value Date   CRP 1.7 (H) 08/15/2020   ESRSEDRATE 12 11/02/2017         Note: Above Lab results reviewed.  Imaging  DG PAIN CLINIC C-ARM 1-60 MIN NO REPORT Fluoro was used, but no Radiologist interpretation will be provided.  Please refer to "NOTES" tab for provider progress note.  Assessment  The primary encounter diagnosis was Chronic low back pain (1ry area of Pain) (Bilateral) w/o sciatica. 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  Plan of Care  Problem-specific:  No problem-specific Assessment & Plan notes found for this encounter.  Kara [name] has a current medication list which includes the following long-term medication(s): calcium carbonate, candesartan, glipizide, glipizide, ropinirole, warfarin, and warfarin.  Pharmacotherapy (Medications Ordered): No orders of the defined types were placed in this encounter.  Orders:  Orders Placed This Encounter  Procedures   LUMBAR FACET(MEDIAL BRANCH NERVE BLOCK) MBNB    Standing Status:   Future    Standing Expiration Date:   07/21/2021    Scheduling Instructions:     Procedure: Lumbar facet block (AKA.: Lumbosacral medial branch nerve block)     Side: Bilateral     Level: L3-4, L4-5, & L5-S1 Facets (L2, L3, L4, L5, & S1 Medial Branch Nerves)     Sedation: Patient's choice.     Timeframe: ASAA    Order Specific Question:   Where will this procedure be performed?    Answer:   [name]   Blood Thinner Instructions to Nursing    Always make sure patient has clearance from prescribing physician to stop blood thinners for interventional therapies. If the patient requires a Lovenox-bridge therapy, make sure arrangements are made to institute it with the assistance of the PCP.    Scheduling Instructions:     Have Kara [name] stop the Coumadin (Warfarin) X 5 days prior to procedure or surgery.    Follow-up plan:   Return in 15 days (on 05/05/2021) for (Clinic) procedure: (B) L-FCT Blk, (Sed-anx), (Blood Thinner Protocol).     Interventional Therapies  Risk  Complexity  Complicating Factors:   Estimated body mass index is 36.49 kg/m as calculated from the following:   Height as  of this encounter: _0  (1.6 m).   Weight as of this encounter: 206 lb (93.4 kg). 1.  Coumadin anticoagulation  (Stop: 5 days  Re-start: 2hrs) 2.  Latex allergy  3.  Morbid obesity (class III) (BMI>40)  4.  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   Under consideration:   Palliative procedures until BMI<35.   Completed:   Palliative/Therapeutic left L4-5 LESI x1 (11/10/2017) Palliative left  lumbar facet block x14 (06/24/2020) Palliative right lumbar facet block(s) x19 (08/05/2020) Palliative right SI joint block x8 (not as effective as facet Blk) (03/08/2019) Diagnostic left SI joint block x1 (not as effective as facet Blk) (03/08/2019) Palliative/Therapeutic (Midline) caudal ESI x3 (06/08/2018)   Palliative options:   Palliative/Therapeutic left L4-5 LESI #2   Palliative left lumbar facet block #15  Palliative right lumbar facet block(s) #19  Palliative right SI joint block #9  Diagnostic left SI joint block #2  Palliative/Therapeutic (Midline) caudal ESI #4     Recent Visits Date Type Provider Dept  03/03/21 Procedure visit Milinda Pointer, MD [name]  02/24/21 Office Visit Milinda Pointer, MD [name]  Showing recent visits within past 90 days and meeting all other requirements Today's Visits Date Type Provider Dept  04/20/21 Office Visit Milinda Pointer, MD [name]  Showing today's visits and meeting all other requirements Future Appointments No visits were found meeting these conditions. Showing future appointments within next 90 days and meeting all other requirements I discussed the assessment and treatment plan with the patient. The patient was provided an opportunity to ask questions and all were answered. The patient agreed with the plan and demonstrated an understanding of the instructions.  Patient advised to call  back or seek an in-person evaluation if the symptoms or condition worsens.  Duration of encounter: 16 minutes.  Note by: Gaspar Cola, MD Date: 04/20/2021; Time: 1:24 PM

## 2021-04-20 ENCOUNTER — Other Ambulatory Visit: Payer: Self-pay

## 2021-04-20 ENCOUNTER — Ambulatory Visit: Payer: PPO | Attending: Pain Medicine | Admitting: Pain Medicine

## 2021-04-20 ENCOUNTER — Telehealth: Payer: Self-pay

## 2021-04-20 ENCOUNTER — Encounter: Payer: Self-pay | Admitting: Pain Medicine

## 2021-04-20 DIAGNOSIS — M47816 Spondylosis without myelopathy or radiculopathy, lumbar region: Secondary | ICD-10-CM | POA: Diagnosis not present

## 2021-04-20 DIAGNOSIS — M4300 Spondylolysis, site unspecified: Secondary | ICD-10-CM

## 2021-04-20 DIAGNOSIS — M545 Low back pain, unspecified: Secondary | ICD-10-CM | POA: Diagnosis not present

## 2021-04-20 DIAGNOSIS — G8929 Other chronic pain: Secondary | ICD-10-CM

## 2021-04-20 DIAGNOSIS — M47817 Spondylosis without myelopathy or radiculopathy, lumbosacral region: Secondary | ICD-10-CM | POA: Diagnosis not present

## 2021-04-20 DIAGNOSIS — M4310 Spondylolisthesis, site unspecified: Secondary | ICD-10-CM

## 2021-04-20 DIAGNOSIS — Z7901 Long term (current) use of anticoagulants: Secondary | ICD-10-CM

## 2021-04-20 DIAGNOSIS — M4306 Spondylolysis, lumbar region: Secondary | ICD-10-CM | POA: Diagnosis not present

## 2021-04-20 NOTE — Patient Instructions (Addendum)
______________________________________________________________________  Preparing for Procedure with Sedation  NOTICE: Due to recent regulatory changes, starting on December 22, 2020, procedures requiring intravenous (IV) sedation will no longer be performed at the [facility].  These types of procedures are required to be performed at [facility] ambulatory surgery facility.  We are very sorry for the inconvenience.  Procedure appointments are limited to planned procedures: No Prescription Refills. No disability issues will be discussed. No medication changes will be discussed.  Instructions: Oral Intake: Do not eat or drink anything for at least 8 hours prior to your procedure. (Exception: Blood Pressure Medication. See below.) Transportation: A driver is required. You may not drive yourself after the procedure. Blood Pressure Medicine: Do not forget to take your blood pressure medicine with a sip of water the morning of the procedure. If your Diastolic (lower reading) is above 100 mmHg, elective cases will be cancelled/rescheduled. Blood thinners: These will need to be stopped for procedures. Notify our staff if you are taking any blood thinners. Depending on which one you take, there will be specific instructions on how and when to stop it. Diabetics on insulin: Notify the staff so that you can be scheduled 1st case in the morning. If your diabetes requires high dose insulin, take only  of your normal insulin dose the morning of the procedure and notify the staff that you have done so. Preventing infections: Shower with an antibacterial soap the morning of your procedure. Build-up your immune system: Take 1000 mg of Vitamin C with every meal (3 times a day) the day prior to your procedure. Antibiotics: Inform the staff if you have a condition or reason that requires you to take antibiotics before dental procedures. Pregnancy: If you are pregnant, call and cancel the procedure. Sickness: If  you have a cold, fever, or any active infections, call and cancel the procedure. Arrival: You must be in the facility at least 30 minutes prior to your scheduled procedure. Children: Do not bring children with you. Dress appropriately: Bring dark clothing that you would not mind if they get stained. Valuables: Do not bring any jewelry or valuables.  Reasons to call and reschedule or cancel your procedure: (Following these recommendations will minimize the risk of a serious complication.) Surgeries: Avoid having procedures within 2 weeks of any surgery. (Avoid for 2 weeks before or after any surgery). Flu Shots: Avoid having procedures within 2 weeks of a flu shots. (Avoid for 2 weeks before or after immunizations). Barium: Avoid having a procedure within 7-10 days after having had a radiological study involving the use of radiological contrast. (Myelograms, Barium swallow or enema study). Heart attacks: Avoid any elective procedures or surgeries for the initial 6 months after a "Myocardial Infarction" (Heart Attack). Blood thinners: It is imperative that you stop these medications before procedures. Let us know if you if you take any blood thinner.  Infection: Avoid procedures during or within two weeks of an infection (including chest colds or gastrointestinal problems). Symptoms associated with infections include: Localized redness, fever, chills, night sweats or profuse sweating, burning sensation when voiding, cough, congestion, stuffiness, runny nose, sore throat, diarrhea, nausea, vomiting, cold or Flu symptoms, recent or current infections. It is specially important if the infection is over the area that we intend to treat. Heart and lung problems: Symptoms that may suggest an active cardiopulmonary problem include: cough, chest pain, breathing difficulties or shortness of breath, dizziness, ankle swelling, uncontrolled high or unusually low blood pressure, and/or palpitations. If you are  experiencing any of these symptoms, cancel your procedure and contact your primary care physician for an evaluation.  Remember:  Regular Business hours are:  Monday to Thursday 8:00 AM to 4:00 PM  Provider's Schedule: Milinda Pointer, MD:  Procedure days: Tuesday and Thursday 7:30 AM to 4:00 PM  Gillis Santa, MD:  Procedure days: Monday and Wednesday 7:30 AM to 4:00 PM ______________________________________________________________________    Stop Coumadin 5 days before your procedure.

## 2021-04-20 NOTE — Telephone Encounter (Signed)
Attempted to call patient to review meds and alleg for todays appt. No answer, left message to call our office so Dr Lowella Dandy would call her.

## 2021-04-22 DIAGNOSIS — M545 Low back pain, unspecified: Secondary | ICD-10-CM | POA: Diagnosis not present

## 2021-04-30 ENCOUNTER — Other Ambulatory Visit: Payer: Self-pay

## 2021-04-30 ENCOUNTER — Emergency Department: Payer: PPO

## 2021-04-30 ENCOUNTER — Emergency Department
Admission: EM | Admit: 2021-04-30 | Discharge: 2021-04-30 | Disposition: A | Discharge status: Home / Self Care | Payer: PPO | Attending: Emergency Medicine | Admitting: Emergency Medicine

## 2021-04-30 DIAGNOSIS — E119 Type 2 diabetes mellitus without complications: Secondary | ICD-10-CM | POA: Insufficient documentation

## 2021-04-30 DIAGNOSIS — M5441 Lumbago with sciatica, right side: Secondary | ICD-10-CM | POA: Insufficient documentation

## 2021-04-30 DIAGNOSIS — M5442 Lumbago with sciatica, left side: Secondary | ICD-10-CM | POA: Diagnosis not present

## 2021-04-30 DIAGNOSIS — Z7901 Long term (current) use of anticoagulants: Secondary | ICD-10-CM | POA: Insufficient documentation

## 2021-04-30 DIAGNOSIS — I110 Hypertensive heart disease with heart failure: Secondary | ICD-10-CM | POA: Insufficient documentation

## 2021-04-30 DIAGNOSIS — Z8616 Personal history of COVID-19: Secondary | ICD-10-CM | POA: Insufficient documentation

## 2021-04-30 DIAGNOSIS — Z79899 Other long term (current) drug therapy: Secondary | ICD-10-CM | POA: Insufficient documentation

## 2021-04-30 DIAGNOSIS — M8588 Other specified disorders of bone density and structure, other site: Secondary | ICD-10-CM | POA: Diagnosis not present

## 2021-04-30 DIAGNOSIS — Z7984 Long term (current) use of oral hypoglycemic drugs: Secondary | ICD-10-CM | POA: Diagnosis not present

## 2021-04-30 DIAGNOSIS — I5023 Acute on chronic systolic (congestive) heart failure: Secondary | ICD-10-CM | POA: Insufficient documentation

## 2021-04-30 DIAGNOSIS — G8929 Other chronic pain: Secondary | ICD-10-CM

## 2021-04-30 DIAGNOSIS — Z91040 Latex allergy status: Secondary | ICD-10-CM | POA: Insufficient documentation

## 2021-04-30 DIAGNOSIS — M545 Low back pain, unspecified: Secondary | ICD-10-CM | POA: Diagnosis not present

## 2021-04-30 NOTE — ED Notes (Signed)
Presents with chronic low R back pain with radiation of pain into the R leg. Denies any loss of bowel or bladder function. At baseline walks with cane. Last dose of pain medication last night. A&Ox4. Skin p/w/d. RR even and nonlabored.

## 2021-04-30 NOTE — ED Triage Notes (Signed)
Pt here with back pain after a fall on Wed. Pt states that when she walks she feels a sharp pain in her lumbar region. Pt went to [facility] and was told she needs to see a spine specialist. Pt in NAD in triage.

## 2021-04-30 NOTE — ED Provider Notes (Signed)
[facility] Emergency Department Provider Note  ____________________________________________  Time seen: Approximately 1:45 PM  I have reviewed the triage vital signs and the nursing notes.   HISTORY  Chief Complaint Back Pain    HPI Kara [name] is a 70 y.o. female with a history of CHF diverticulitis hypertension PE who comes ED complaining of low back pain after recent trip and fall.  She has chronic low back pain, sees pain management for this.  Currently she reports the pain is severe, worse with walking, better with sitting.  Radiating to both legs.  Feels like compression fracture she had in the past which was treated with kyphoplasty by Dr. Rudene Christians.  No motor weakness or loss of sensation.  No bowel or bladder incontinence.  She reports she went to [facility] this morning, had an x-ray done and they told her she needed to see a spine specialist, and so the patient comes to the ED.  [facility] note/x-rays not accessible to me through electronic medical record currently.  Past Medical History:  Diagnosis Date  . Anginal pain ([place])   . 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 CHF (congestive heart failure) ([place])    followed colon resection, "wouldn't let me get up"  . Diabetes ([place])    type 2  . Diverticulitis 2018  . History of being hospitalized    1 month ago for 3 days, vomiting, and kink in upper intestine  . History of hiatal hernia   . Hypertension   . Osteoporosis   . PE (pulmonary thromboembolism) ([place])   . 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 Vertigo      Patient Active Problem List   Diagnosis Date Noted  . Chronic use of opiate for therapeutic purpose 10/22/2020  . Gastroenteritis due to COVID-19 virus 08/14/2020  . Metabolic syndrome 78/24/2353  . Generalized weakness 08/14/2020  . Truncal obesity 08/04/2020  . Latex precautions, history of latex allergy 06/24/2020  .  Hyperglycemia 06/24/2020  . Uncomplicated opioid dependence ([place]) 06/15/2020  . Spasm of muscle of lower back 01/29/2020  . Abnormal MRI, lumbar spine (08/09/2019) 08/14/2019  . Spondylolisthesis, lumbosacral region 07/26/2019  . 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  . Chronic sacroiliac joint pain (Left) 02/28/2019  . Preoperative testing 10/23/2018  . Small bowel obstruction due to adhesions ([place]) 08/24/2018  . Coccygodynia 05/09/2018  . Acute deep vein thrombosis (DVT) of distal end of right lower extremity ([place]) 05/08/2018  . DDD (degenerative disc disease), lumbosacral 04/11/2018  . Chronic anticoagulation (COUMADIN) 04/11/2018  . Secondary osteoarthritis of multiple sites 03/15/2018  . Sacral insufficiency fracture 03/15/2018  . Chronic low back pain (1ry area of Pain) (Bilateral) w/o sciatica 12/14/2017  . Spondylosis without myelopathy or radiculopathy, lumbosacral region 12/14/2017  . Other specified dorsopathies, sacral and sacrococcygeal region 12/14/2017  . History of allergy to latex 11/09/2017  . Hyponatremia 11/02/2017  . Hypocalcemia 11/02/2017  . Grade 1-2 Anterolisthesis of L4/L5 & L5/S1 11/02/2017  . Lumbar foraminal stenosis (L3-4 and L4-5) (Bilateral) 11/02/2017  . Lumbosacral L5-S1 subarticular lateral recess stenosis (Bilateral) 11/02/2017  . Lumbar facet arthropathy (L3-4, L4-5, and L5-S1) (Bilateral) 11/02/2017  . Lumbar facet syndrome (Bilateral) 11/02/2017  . 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 Lumbar pars defect (L3 and L4) (Bilateral) 11/02/2017  . Diabetic peripheral neuropathy ([place]) 11/02/2017  . Chronic lower extremity pain (2ry area of Pain) (Bilateral) 11/02/2017  . Chronic  radicular pain of lower extremity 11/02/2017  . Osteoarthritis of hip (Right) 11/02/2017  . Chronic low back pain (Bilateral) w/ sciatica (Bilateral) 10/13/2017  . Chronic sacroiliac joint pain (Right) 10/13/2017  . Chronic pain syndrome  10/13/2017  . Long term current use of opiate analgesic 10/13/2017  . Pharmacologic therapy 10/13/2017  . Disorder of skeletal system 10/13/2017  . Problems influencing health status 10/13/2017  . Diverticulosis of colon 06/20/2017  . History of pulmonary embolism 06/20/2017  . S/P IVC filter 06/20/2017  . H/O ileostomy 06/01/2017  . Positive result for methicillin resistant Staphylococcus aureus (MRSA) screening 05/11/2017  . Ileostomy in place [facility] 04/08/2017  . History of colostomy reversal 02/24/2017  . Other pulmonary embolism without acute cor pulmonale ([place]) 11/23/2016  . Acute on chronic systolic (congestive) heart failure ([place]) 11/18/2016  . HCAP (healthcare-associated pneumonia) 11/18/2016  . Encounter for removal of staples   . Edema of both legs 11/15/2016  . Severe obesity (BMI 35.0-39.9) with comorbidity ([place]) 11/15/2016  . Diverticulitis 10/03/2016  . Diverticulitis large intestine 09/29/2016  . Benign essential hypertension 08/26/2016  . DDD (degenerative disc disease), lumbar 07/14/2016  . Acute non-recurrent maxillary sinusitis 05/21/2016  . Chronic hip pain (Left) 02/23/2016  . Cough 07/30/2015  . Nocturnal leg cramps 02/20/2015  . Diabetes mellitus type 2 in obese ([place]) 11/12/2014  . Type 2 diabetes mellitus with hyperglycemia, without long-term current use of insulin ([place]) 11/12/2014  . BP (high blood pressure) 11/12/2014     Past Surgical History:  Procedure Laterality Date  . APPENDECTOMY N/A 02/24/2017   Procedure: Incidental  APPENDECTOMY;  Surgeon: [name], MD;  Location: [name] ORS;  Service: General;  Laterality: N/A;  . CARPAL TUNNEL RELEASE Bilateral   . CATARACT EXTRACTION W/PHACO Left 11/21/2018   Procedure: CATARACT EXTRACTION PHACO AND INTRAOCULAR LENS PLACEMENT ([place]) LEFT DIABETES;  Surgeon: Birder Robson, MD;  Location: [facility];  Service: Ophthalmology;  Laterality: Left;  latex sensitivity Diabetic - oral meds  .  CATARACT EXTRACTION W/PHACO Right 12/12/2018   Procedure: CATARACT EXTRACTION PHACO AND INTRAOCULAR LENS PLACEMENT ([place]) RIGHT DIABETES;  Surgeon: Birder Robson, MD;  Location: [facility];  Service: Ophthalmology;  Laterality: Right;  Diabetes-oral med Latex sensitiviy  . COLON RESECTION SIGMOID N/A 11/02/2016   Procedure: COLON RESECTION SIGMOID;  Surgeon: [name], MD;  Location: [name];  Service: General;  Laterality: N/A;  . COLON SURGERY  11/02/2016  . COLONOSCOPY WITH PROPOFOL N/A 02/03/2017   Procedure: COLONOSCOPY WITH PROPOFOL;  Surgeon: Lin Landsman, MD;  Location: [facility] ENDOSCOPY;  Service: Gastroenterology;  Laterality: N/A;  . COLOSTOMY N/A 11/02/2016   Procedure: COLOSTOMY;  Surgeon: [name], MD;  Location: [name];  Service: General;  Laterality: N/A;  . COLOSTOMY REVERSAL N/A 02/24/2017   Procedure: COLOSTOMY REVERSAL, ostomy takedown, spleenic flexure mobilization, excision rectal stump/distal sigmoid, anastomosis with suture reinforcement;  Surgeon: [name], MD;  Location: [name];  Service: General;  Laterality: N/A;  . ILEO LOOP DIVERSION N/A 02/24/2017   Procedure: ILEO LOOP COLOSTOMY;  Surgeon: [name], MD;  Location: [name];  Service: General;  Laterality: N/A;  . ILEOSTOMY CLOSURE N/A 06/01/2017   Procedure: LOOP ILEOSTOMY TAKEDOWN;  Surgeon: [name], MD;  Location: [name];  Service: General;  Laterality: N/A;  . IVC FILTER INSERTION Right 10/2016  . IVC FILTER REMOVAL N/A 08/09/2017   Procedure: IVC FILTER REMOVAL;  Surgeon: Katha Cabal, MD;  Location: [place] CV LAB;  Service: Cardiovascular;  Laterality: N/A;  .  KNEE SURGERY     torn menicus  . LAPAROSCOPY N/A 02/24/2017   Procedure: LAPAROSCOPY DIAGNOSTIC;  Surgeon: [name], MD;  Location: [name];  Service: General;  Laterality: N/A;  . LYSIS OF ADHESION N/A 02/24/2017   Procedure: LYSIS OF ADHESION;  Surgeon: [name], MD;   Location: [name];  Service: General;  Laterality: N/A;  . PULMONARY VENOGRAPHY N/A 11/19/2016   Procedure: Pulmonary Venography; IVC filter placement; possible pulmonary thrombectomy;  Surgeon: Katha Cabal, MD;  Location: [place] [facility] CV LAB;  Service: Cardiovascular;  Laterality: N/A;  . SACROPLASTY N/A 11/08/2017   Procedure: SACROPLASTY S2;  Surgeon: Hessie Knows, MD;  Location: [name];  Service: Orthopedics;  Laterality: N/A;  . SIGMOIDOSCOPY N/A 11/13/2016   Procedure: endoscopic  flexible SIGMOIDOSCOPY;  Surgeon: Florene Glen, MD;  Location: [name];  Service: General;  Laterality: N/A;  . SIGMOIDOSCOPY N/A 05/19/2017   Procedure: FLEXIBLE SIGMOIDOSCOPY;  Surgeon: [name], MD;  Location: [name];  Service: General;  Laterality: N/A;  . VIDEO BRONCHOSCOPY WITH ENDOBRONCHIAL NAVIGATION N/A 02/23/2021   Procedure: ROBOTIC VIDEO [place];  Surgeon: Tyler Pita, MD;  Location: [name] OR[facility];  Service: Pulmonary;  Laterality: N/A;     Prior to Admission medications   Medication Sig Start Date End Date Taking? Authorizing Provider  alendronate (FOSAMAX) 70 MG tablet Take 70 mg by mouth every Friday. Take with a full glass of water on an empty stomach.    [provider]  calcium carbonate (CALCIUM 600) 600 MG TABS tablet Take 1 tablet (600 mg total) by mouth 2 (two) times daily with a meal. 02/25/20 11/10/21  Milinda Pointer, MD  candesartan (ATACAND) 16 MG tablet Take 16 mg by mouth in the morning. 08/03/17   [provider]  cholecalciferol (VITAMIN D) 25 MCG (1000 UNIT) tablet Take 1,000 Units by mouth in the morning.    [provider]  Cyanocobalamin (VITAMIN B-12) 5000 MCG TBDP Take 5,000 mcg by mouth in the morning.    [provider]  cyclobenzaprine (FLEXERIL) 10 MG tablet Take 10 mg by mouth at bedtime. 07/05/16   [provider]  Dulaglutide 1.5 MG/0.5ML SOPN Inject 1.5 mg into the  skin every Saturday.    [provider]  gabapentin (NEURONTIN) 100 MG capsule Take 200 mg by mouth with breakfast, with lunch, and with evening meal.    [provider]  gabapentin (NEURONTIN) 300 MG capsule Take 300 mg by mouth at bedtime.    [provider]  glipiZIDE (GLUCOTROL XL) 5 MG 24 hr tablet Take 5 mg by mouth daily with breakfast. 5 mg +10 mg=15 mg total in the morning 10/27/16   [provider]  glipiZIDE (GLUCOTROL) 10 MG tablet Take 10 mg by mouth daily before breakfast. 10 mg +5 mg=15 mg total in the morning    [provider]  hydrocortisone 2.5 % cream Apply 1 application topically 2 (two) times daily as needed (rash/redness). 12/08/20 12/08/21  [provider]  meclizine (ANTIVERT) 25 MG tablet Take 25 mg by mouth 3 (three) times daily as needed for dizziness. 12/29/17   [provider]  Multiple Vitamin (MULTIVITAMIN WITH MINERALS) TABS tablet Take 1 tablet by mouth in the morning.    [provider]  rOPINIRole (REQUIP) 0.5 MG tablet Take 0.5 mg by mouth at bedtime.    [provider]  tolterodine (DETROL) 2 MG tablet Take 2 mg by mouth 2 (two) times daily. 11/24/19  [provider]  warfarin (COUMADIN) 1 MG tablet Take 1 mg by mouth See admin instructions. Take 1 tablet (1 mg) with 1 tablet (6 mg) by mouth on Fridays, Saturdays & Sundays (7 mg)    [provider]  warfarin (COUMADIN) 6 MG tablet Take 6 mg by mouth See admin instructions. Take 1 tablet (6 mg) by mouth on Mondays, Tuesdays, Wednesdays, & Thursdays Take 1 tablet (6 mg) with 1 tablet (1 mg) by mouth on Fridays, Saturdays & Sundays (7 mg)    [provider]     Allergies Adhesive [tape], Other, Latex, and Morphine and related   Family History  Problem Relation Age of Onset  . Diabetes Mother   . Heart disease Mother   . Cancer Mother   . Breast cancer Mother        >50  . Heart disease Father   . Diabetes  Sister   . Heart disease Sister     Social History Social History   Tobacco Use  . Smoking status: Never  . Smokeless tobacco: Never  Vaping Use  . Vaping Use: Never used  Substance Use Topics  . Alcohol use: No    Alcohol/week: 0.0 standard drinks  . Drug use: No    Review of Systems  Constitutional:   No fever or chills.  ENT:   No sore throat. No rhinorrhea. Cardiovascular:   No chest pain or syncope. Respiratory:   No dyspnea or cough. Gastrointestinal:   Negative for abdominal pain, vomiting and diarrhea.  Musculoskeletal:   Acute on chronic low back pain as above All other systems reviewed and are negative except as documented above in ROS and HPI.  ____________________________________________   PHYSICAL EXAM:  VITAL SIGNS: ED Triage Vitals  Enc Vitals Group     BP 04/30/21 1252 (!) 148/75     Pulse Rate 04/30/21 1252 92     Resp 04/30/21 1252 18     Temp 04/30/21 1252 98.2 F (36.8 C)     Temp Source 04/30/21 1252 Oral     SpO2 04/30/21 1252 95 %     Weight 04/30/21 1253 206 lb (93.4 kg)     Height 04/30/21 1253 5\' 3"  (1.6 m)     Head Circumference --      Peak Flow --      Pain Score 04/30/21 1253 10     Pain Loc --      Pain Edu? --      Excl. in [place]? --     Vital signs reviewed, nursing assessments reviewed.   Constitutional:   Alert and oriented. Non-toxic appearance. Eyes:   Conjunctivae are normal. EOMI. PERRL. ENT      Head:   Normocephalic and atraumatic.      Nose:   Wearing a mask.      Mouth/Throat:   Wearing a mask.      Neck:   No meningismus. Full ROM. Hematological/Lymphatic/Immunilogical:   No cervical lymphadenopathy. Cardiovascular:   RRR.  Normal bilateral PT pulses.  Cap refill less than 2 seconds. Respiratory:   Normal respiratory effort without tachypnea/retractions. Gastrointestinal:   Soft and nontender. Non distended. There is no CVA tenderness.  No rebound, rigidity, or guarding.  Musculoskeletal:   Normal range of  motion in all extremities. No joint effusions.  No lower extremity tenderness.  No edema.  Straight leg raise negative.  There is tenderness diffusely along the lumbar spine.  There is also tenderness in the musculature  of the lumbar back bilaterally. Neurologic:   Normal speech and language.  Motor grossly intact. No acute focal neurologic deficits are appreciated.  Skin:    Skin is warm, dry and intact. No rash noted.  No petechiae, purpura, or bullae.  ____________________________________________    LABS (pertinent positives/negatives) (all labs ordered are listed, but only abnormal results are displayed) Labs Reviewed - No data to display ____________________________________________   EKG    ____________________________________________    RADIOLOGY  No results found.  ____________________________________________   PROCEDURES Procedures  ____________________________________________  DIFFERENTIAL DIAGNOSIS   Lumbar compression fracture, muscle strain  CLINICAL IMPRESSION / ASSESSMENT AND PLAN / ED COURSE  Medications ordered in the ED: Medications - No data to display  Pertinent labs & imaging results that were available during my care of the patient were reviewed by me and considered in my medical decision making (see chart for details).  Kara [name] was evaluated in Emergency Department on 04/30/2021 for the symptoms described in the history of present illness. She was evaluated in the context of the global COVID-19 pandemic, which necessitated consideration that the patient might be at risk for infection with the SARS-CoV-2 virus that causes COVID-19. Institutional protocols and algorithms that pertain to the evaluation of patients at risk for COVID-19 are in a state of rapid change based on information released by regulatory bodies including the CDC and federal and state organizations. These policies and algorithms were followed during the patient's care  in the ED.   Patient presents with acute on chronic low back pain.  Most likely due to muscle strain, will obtain x-ray to evaluate for compression fracture, follow-up with Dr. Rudene Christians.  Clinical Course as of 04/30/21 1406  Thu Apr 30, 2021  1406 No new changes on x-ray.  Stable for discharge. [PS]    Clinical Course User Index [PS] Carrie Mew, MD     ____________________________________________   FINAL CLINICAL IMPRESSION(S) / ED DIAGNOSES    Final diagnoses:  Chronic bilateral low back pain with bilateral sciatica     ED Discharge Orders     None       Portions of this note were generated with dragon dictation software. Dictation errors may occur despite best attempts at proofreading.    Carrie Mew, MD 04/30/21 1349

## 2021-05-05 ENCOUNTER — Other Ambulatory Visit: Payer: Self-pay | Admitting: Orthopedic Surgery

## 2021-05-05 ENCOUNTER — Ambulatory Visit
Admission: RE | Admit: 2021-05-05 | Discharge: 2021-05-05 | Disposition: A | Discharge status: Home / Self Care | Payer: PPO | Source: Ambulatory Visit | Attending: Pain Medicine | Admitting: Pain Medicine

## 2021-05-05 ENCOUNTER — Ambulatory Visit (HOSPITAL_BASED_OUTPATIENT_CLINIC_OR_DEPARTMENT_OTHER): Payer: PPO | Admitting: Pain Medicine

## 2021-05-05 ENCOUNTER — Encounter: Payer: Self-pay | Admitting: Pain Medicine

## 2021-05-05 ENCOUNTER — Other Ambulatory Visit: Payer: Self-pay

## 2021-05-05 VITALS — BP 129/62 | HR 91 | Temp 97.4°F | Resp 18 | Ht 63.0 in | Wt 201.0 lb

## 2021-05-05 DIAGNOSIS — M47816 Spondylosis without myelopathy or radiculopathy, lumbar region: Secondary | ICD-10-CM

## 2021-05-05 DIAGNOSIS — M47817 Spondylosis without myelopathy or radiculopathy, lumbosacral region: Secondary | ICD-10-CM

## 2021-05-05 DIAGNOSIS — M4310 Spondylolisthesis, site unspecified: Secondary | ICD-10-CM | POA: Diagnosis not present

## 2021-05-05 DIAGNOSIS — Z7901 Long term (current) use of anticoagulants: Secondary | ICD-10-CM | POA: Diagnosis not present

## 2021-05-05 DIAGNOSIS — M4300 Spondylolysis, site unspecified: Secondary | ICD-10-CM

## 2021-05-05 DIAGNOSIS — M4306 Spondylolysis, lumbar region: Secondary | ICD-10-CM

## 2021-05-05 DIAGNOSIS — E1165 Type 2 diabetes mellitus with hyperglycemia: Secondary | ICD-10-CM | POA: Diagnosis not present

## 2021-05-05 DIAGNOSIS — E669 Obesity, unspecified: Secondary | ICD-10-CM | POA: Diagnosis not present

## 2021-05-05 DIAGNOSIS — M5137 Other intervertebral disc degeneration, lumbosacral region: Secondary | ICD-10-CM

## 2021-05-05 DIAGNOSIS — G8929 Other chronic pain: Secondary | ICD-10-CM | POA: Diagnosis not present

## 2021-05-05 DIAGNOSIS — M545 Low back pain, unspecified: Secondary | ICD-10-CM

## 2021-05-05 DIAGNOSIS — M51379 Other intervertebral disc degeneration, lumbosacral region without mention of lumbar back pain or lower extremity pain: Secondary | ICD-10-CM

## 2021-05-05 MED ORDER — TRIAMCINOLONE ACETONIDE 40 MG/ML IJ SUSP
80.0000 mg | Freq: Once | INTRAMUSCULAR | Status: AC
  Administered 2021-05-05: 80 mg
  Filled 2021-05-05: qty 2

## 2021-05-05 MED ORDER — LIDOCAINE HCL 2 % IJ SOLN
20.0000 mL | Freq: Once | INTRAMUSCULAR | Status: AC
  Administered 2021-05-05: 200 mg
  Filled 2021-05-05: qty 200

## 2021-05-05 MED ORDER — ROPIVACAINE HCL 2 MG/ML IJ SOLN
18.0000 mL | Freq: Once | INTRAMUSCULAR | Status: AC
  Administered 2021-05-05: 18 mL via PERINEURAL

## 2021-05-05 MED ORDER — MIDAZOLAM HCL 5 MG/5ML IJ SOLN
0.5000 mg | Freq: Once | INTRAMUSCULAR | Status: AC
  Administered 2021-05-05: 2 mg via INTRAVENOUS
  Filled 2021-05-05: qty 5

## 2021-05-05 NOTE — Progress Notes (Signed)
PROVIDER NOTE: Information contained herein reflects review and annotations entered in association with encounter. Interpretation of such information and data should be left to medically-trained personnel. Information provided to patient can be located elsewhere in the medical record under "Patient Instructions". Document created using STT-dictation technology, any transcriptional errors that may result from process are unintentional.    Patient: Kara [name]  Service Category: Procedure Provider: Gaspar Cola, MD DOB: February 28, 1951 DOS: 05/05/2021 Location: [facility] MRN: [id number] Setting: Ambulatory - outpatient Referring Provider: Milinda Pointer, MD Type: Established Patient Specialty: Interventional Pain Management PCP: Tracie Harrier, MD  Primary Reason for Visit: Interventional Pain Management Treatment. CC: Back Pain (low)    Procedure:          Type: Lumbar Facet, Medial Branch Block(s)          Primary Purpose: Diagnostic/Therapeutic Region: Posterolateral Lumbosacral Spine Level: L2, L3, L4, L5, & S1 Medial Branch Level(s). Injecting these levels blocks the L3-4, L4-5, and L5-S1 lumbar facet joints. Laterality: Bilateral Anesthesia: Local (1-2% Lidocaine)  Anxiolysis: IV  Sedation: None  Guidance: Fluoroscopy          Indications: 1. Lumbar facet syndrome (Bilateral)   2. Spondylosis without myelopathy or radiculopathy, lumbosacral region   3. 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 DDD (degenerative disc disease), lumbosacral   6. Lumbar facet arthropathy (L3-4, L4-5, and L5-S1) (Bilateral)   7. Chronic low back pain (1ry area of Pain) (Bilateral) w/o sciatica   8. Chronic anticoagulation (COUMADIN)   9. Severe obesity (BMI 35.0-39.9) with comorbidity ([place])   10. Osteoarthritis of lumbar spine   11. 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 Acute  exacerbation of chronic low back pain    Pain Score: Pre-procedure: 10-Worst pain ever/10 Post-procedure: 0-No pain/10    Position: Prone  Note: The patient indicates having experienced a fall several weeks ago and since then she has ended up with increased pain in the right lower back pain.  This pain is described as intermittent, localized, sharp, stabbing like sensations in the right lower back below the PSIS area but with no radiation of the pain towards the lower extremity.  The patient indicates having gone to the ED and having had some x-rays which were read to her as negative.  In fact, the patient indicated that pain seem to be similar to an instance where she fractured a bone and therefore she went ahead and had a second x-ray done which again was read as negative.  Pre-op H&P Assessment:  Kara [name] is a 70 y.o. (year old), female patient, seen today for interventional treatment. She  has a past surgical history that includes Knee surgery; Carpal tunnel release (Bilateral); Colon resection sigmoid (N/A, 11/02/2016); Colostomy (N/A, 11/02/2016); Sigmoidoscopy (N/A, 11/13/2016); PULMONARY VENOGRAPHY (N/A, 11/19/2016); Colonoscopy with propofol (N/A, 02/03/2017); Colon surgery (11/02/2016); IVC FILTER INSERTION (Right, 10/2016); Colostomy reversal (N/A, 02/24/2017); Appendectomy (N/A, 02/24/2017); Lysis of adhesion (N/A, 02/24/2017); laparoscopy (N/A, 02/24/2017); Ileo loop diversion (N/A, 02/24/2017); Sigmoidoscopy (N/A, 05/19/2017); Ileostomy closure (N/A, 06/01/2017); IVC FILTER REMOVAL (N/A, 08/09/2017); Sacroplasty (N/A, 11/08/2017); Cataract extraction w/PHACO (Left, 11/21/2018); Cataract extraction w/PHACO (Right, 12/12/2018); and Video bronchoscopy with endobronchial navigation (N/A, 02/23/2021). Kara [name] has a current medication list which includes the following prescription(s): alendronate, calcium carbonate, candesartan, cholecalciferol, vitamin b-12, cyclobenzaprine, dulaglutide, gabapentin,  gabapentin, glipizide, glipizide, hydrocortisone, ibuprofen, meclizine, multivitamin with minerals, ropinirole, tolterodine, warfarin, and warfarin. Her  primarily concern today is the Back Pain (low)  Initial Vital Signs:  Pulse/HCG Rate: 91ECG Heart Rate: 91 Temp: (!) 97.1 F (36.2 C) Resp: 18 BP: 134/74 SpO2: 96 %  BMI: Estimated body mass index is 35.61 kg/m as calculated from the following:   Height as of this encounter: 5\' 3"  (1.6 m).   Weight as of this encounter: 201 lb (91.2 kg).  Risk Assessment: Allergies: Reviewed. She is allergic to adhesive [tape], morphine, other, latex, and morphine and related.  Allergy Precautions: None required Coagulopathies: Reviewed. None identified.  Blood-thinner therapy: None at this time Active Infection(s): Reviewed. None identified. Kara [name] is afebrile  Site Confirmation: Kara [name] was asked to confirm the procedure and laterality before marking the site Procedure checklist: Completed Consent: Before the procedure and under the influence of no sedative(s), amnesic(s), or anxiolytics, the patient was informed of the treatment options, risks and possible complications. To fulfill our ethical and legal obligations, as recommended by the American Medical Association's Code of Ethics, I have informed the patient of my clinical impression; the nature and purpose of the treatment or procedure; the risks, benefits, and possible complications of the intervention; the alternatives, including doing nothing; the risk(s) and benefit(s) of the alternative treatment(s) or procedure(s); and the risk(s) and benefit(s) of doing nothing. The patient was provided information about the general risks and possible complications associated with the procedure. These may include, but are not limited to: failure to achieve desired goals, infection, bleeding, organ or nerve damage, allergic reactions, paralysis, and death. In addition, the patient was informed of  those risks and complications associated to Spine-related procedures, such as failure to decrease pain; infection (i.e.: Meningitis, epidural or intraspinal abscess); bleeding (i.e.: epidural hematoma, subarachnoid hemorrhage, or any other type of intraspinal or peri-dural bleeding); organ or nerve damage (i.e.: Any type of peripheral nerve, nerve root, or spinal cord injury) with subsequent damage to sensory, motor, and/or autonomic systems, resulting in permanent pain, numbness, and/or weakness of one or several areas of the body; allergic reactions; (i.e.: anaphylactic reaction); and/or death. Furthermore, the patient was informed of those risks and complications associated with the medications. These include, but are not limited to: allergic reactions (i.e.: anaphylactic or anaphylactoid reaction(s)); adrenal axis suppression; blood sugar elevation that in diabetics may result in ketoacidosis or comma; water retention that in patients with history of congestive heart failure may result in shortness of breath, pulmonary edema, and decompensation with resultant heart failure; weight gain; swelling or edema; medication-induced neural toxicity; particulate matter embolism and blood vessel occlusion with resultant organ, and/or nervous system infarction; and/or aseptic necrosis of one or more joints. Finally, the patient was informed that Medicine is not an exact science; therefore, there is also the possibility of unforeseen or unpredictable risks and/or possible complications that may result in a catastrophic outcome. The patient indicated having understood very clearly. We have given the patient no guarantees and we have made no promises. Enough time was given to the patient to ask questions, all of which were answered to the patient's satisfaction. Kara [name] has indicated that she wanted to continue with the procedure. Attestation: I, the ordering provider, attest that I have discussed with the patient the  benefits, risks, side-effects, alternatives, likelihood of achieving goals, and potential problems during recovery for the procedure that I have provided informed consent. Date  Time: 05/05/2021  8:38 AM  Pre-Procedure Preparation:  Monitoring: As per clinic protocol. Respiration, ETCO2, SpO2, BP, heart rate and rhythm monitor placed  and checked for adequate function Safety Precautions: Patient was assessed for positional comfort and pressure points before starting the procedure. Time-out: I initiated and conducted the "Time-out" before starting the procedure, as per protocol. The patient was asked to participate by confirming the accuracy of the "Time Out" information. Verification of the correct person, site, and procedure were performed and confirmed by me, the nursing staff, and the patient. "Time-out" conducted as per Joint Commission's Universal Protocol (UP.01.01.01). Time: 0920  Description of Procedure:          Laterality: Bilateral. The procedure was performed in identical fashion on both sides. Levels:  L2, L3, L4, L5, & S1 Medial Branch Level(s) Area Prepped: Posterior Lumbosacral Region DuraPrep (Iodine Povacrylex [0.7% available iodine] and Isopropyl Alcohol, 74% w/w) Safety Precautions: Aspiration looking for blood return was conducted prior to all injections. At no point did we inject any substances, as a needle was being advanced. Before injecting, the patient was told to immediately notify me if she was experiencing any new onset of "ringing in the ears, or metallic taste in the mouth". No attempts were made at seeking any paresthesias. Safe injection practices and needle disposal techniques used. Medications properly checked for expiration dates. SDV (single dose vial) medications used. After the completion of the procedure, all disposable equipment used was discarded in the proper designated medical waste containers. Local Anesthesia: Protocol guidelines were followed. The  patient was positioned over the fluoroscopy table. The area was prepped in the usual manner. The time-out was completed. The target area was identified using fluoroscopy. A 12-in long, straight, sterile hemostat was used with fluoroscopic guidance to locate the targets for each level blocked. Once located, the skin was marked with an approved surgical skin marker. Once all sites were marked, the skin (epidermis, dermis, and hypodermis), as well as deeper tissues (fat, connective tissue and muscle) were infiltrated with a small amount of a short-acting local anesthetic, loaded on a 10cc syringe with a 25G, 1.5-in  Needle. An appropriate amount of time was allowed for local anesthetics to take effect before proceeding to the next step. Local Anesthetic: Lidocaine 2.0% The unused portion of the local anesthetic was discarded in the proper designated containers. Technical explanation of process:  L2 Medial Branch Nerve Block (MBB): The target area for the L2 medial branch is at the junction of the postero-lateral aspect of the superior articular process and the superior, posterior, and medial edge of the transverse process of L3. Under fluoroscopic guidance, a Quincke needle was inserted until contact was made with os over the superior postero-lateral aspect of the pedicular shadow (target area). After negative aspiration for blood, 0.5 mL of the nerve block solution was injected without difficulty or complication. The needle was removed intact. L3 Medial Branch Nerve Block (MBB): The target area for the L3 medial branch is at the junction of the postero-lateral aspect of the superior articular process and the superior, posterior, and medial edge of the transverse process of L4. Under fluoroscopic guidance, a Quincke needle was inserted until contact was made with os over the superior postero-lateral aspect of the pedicular shadow (target area). After negative aspiration for blood, 0.5 mL of the nerve block solution  was injected without difficulty or complication. The needle was removed intact. L4 Medial Branch Nerve Block (MBB): The target area for the L4 medial branch is at the junction of the postero-lateral aspect of the superior articular process and the superior, posterior, and medial edge of the transverse process of  L5. Under fluoroscopic guidance, a Quincke needle was inserted until contact was made with os over the superior postero-lateral aspect of the pedicular shadow (target area). After negative aspiration for blood, 0.5 mL of the nerve block solution was injected without difficulty or complication. The needle was removed intact. L5 Medial Branch Nerve Block (MBB): The target area for the L5 medial branch is at the junction of the postero-lateral aspect of the superior articular process and the superior, posterior, and medial edge of the sacral ala. Under fluoroscopic guidance, a Quincke needle was inserted until contact was made with os over the superior postero-lateral aspect of the pedicular shadow (target area). After negative aspiration for blood, 0.5 mL of the nerve block solution was injected without difficulty or complication. The needle was removed intact. S1 Medial Branch Nerve Block (MBB): The target area for the S1 medial branch is at the posterior and inferior 6 o'clock position of the L5-S1 facet joint. Under fluoroscopic guidance, the Quincke needle inserted for the L5 MBB was redirected until contact was made with os over the inferior and postero aspect of the sacrum, at the 6 o' clock position under the L5-S1 facet joint (Target area). After negative aspiration for blood, 0.5 mL of the nerve block solution was injected without difficulty or complication. The needle was removed intact.  Nerve block solution: 0.2% PF-Ropivacaine + Triamcinolone (40 mg/mL) diluted to a final concentration of 4 mg of Triamcinolone/mL of Ropivacaine The unused portion of the solution was discarded in the proper  designated containers. Procedural Needles: 22-gauge, 5-inch, Quincke needles used for all levels.  Once the entire procedure was completed, the treated area was cleaned, making sure to leave some of the prepping solution back to take advantage of its long term bactericidal properties.      Illustration of the posterior view of the lumbar spine and the posterior neural structures. Laminae of L2 through S1 are labeled. DPRL5, dorsal primary ramus of L5; DPRS1, dorsal primary ramus of S1; DPR3, dorsal primary ramus of L3; FJ, facet (zygapophyseal) joint L3-L4; I, inferior articular process of L4; LB1, lateral branch of dorsal primary ramus of L1; IAB, inferior articular branches from L3 medial branch (supplies L4-L5 facet joint); IBP, intermediate branch plexus; MB3, medial branch of dorsal primary ramus of L3; NR3, third lumbar nerve root; S, superior articular process of L5; SAB, superior articular branches from L4 (supplies L4-5 facet joint also); TP3, transverse process of L3.  Vitals:   05/05/21 0920 05/05/21 0925 05/05/21 0930 05/05/21 0939  BP: (!) 151/90 133/78 130/84 129/62  Pulse:      Resp: 18 16 18 18   Temp:    (!) 97.4 F (36.3 C)  SpO2: 96% 95% 95% 96%  Weight:      Height:         Start Time: 0920 hrs. End Time: 0930 hrs.  Imaging Guidance (Spinal):          Type of Imaging Technique: Fluoroscopy Guidance (Spinal) Indication(s): Assistance in needle guidance and placement for procedures requiring needle placement in or near specific anatomical locations not easily accessible without such assistance. Exposure Time: Please see nurses notes. Contrast: None used. Fluoroscopic Guidance: I was personally present during the use of fluoroscopy. "Tunnel Vision Technique" used to obtain the best possible view of the target area. Parallax error corrected before commencing the procedure. "Direction-depth-direction" technique used to introduce the needle under continuous pulsed  fluoroscopy. Once target was reached, antero-posterior, oblique, and lateral fluoroscopic projection used confirm  needle placement in all planes. Images permanently stored in EMR. Interpretation: No contrast injected. I personally interpreted the imaging intraoperatively. Adequate needle placement confirmed in multiple planes. Permanent images saved into the patient's record.  Antibiotic Prophylaxis:   Anti-infectives (From admission, onward)    None      Indication(s): None identified  Post-operative Assessment:  Post-procedure Vital Signs:  Pulse/HCG Rate: 9191 Temp: (!) 97.4 F (36.3 C) Resp: 18 BP: 129/62 SpO2: 96 %  EBL: None  Complications: No immediate post-treatment complications observed by team, or reported by patient.  Note: The patient tolerated the entire procedure well. A repeat set of vitals were taken after the procedure and the patient was kept under observation following institutional policy, for this type of procedure. Post-procedural neurological assessment was performed, showing return to baseline, prior to discharge. The patient was provided with post-procedure discharge instructions, including a section on how to identify potential problems. Should any problems arise concerning this procedure, the patient was given instructions to immediately contact us, at any time, without hesitation. In any case, we plan to contact the patient by telephone for a follow-up status report regarding this interventional procedure.  Comments:  No additional relevant information.  Plan of Care  Orders:  Orders Placed This Encounter  Procedures   LUMBAR FACET(MEDIAL BRANCH NERVE BLOCK) MBNB    Scheduling Instructions:     Procedure: Lumbar facet block (AKA.: Lumbosacral medial branch nerve block)     Side: Bilateral     Level: L3-4, L4-5, & L5-S1 Facets (L2, L3, L4, L5, & S1 Medial Branch Nerves)     Sedation: Patient's choice.     Timeframe: Today    Order Specific  Question:   Where will this procedure be performed?    Answer:   [name]   DG PAIN [facility] C-ARM 1-60 MIN NO REPORT    Intraoperative interpretation by procedural physician at [place].    Standing Status:   Standing    Number of Occurrences:   1    Order Specific Question:   Reason for exam:    Answer:   Assistance in needle guidance and placement for procedures requiring needle placement in or near specific anatomical locations not easily accessible without such assistance.   Informed Consent Details: Physician/Practitioner Attestation; Transcribe to consent form and obtain patient signature    Nursing Order: Transcribe to consent form and obtain patient signature. Note: Always confirm laterality of pain with Kara [name], before procedure.    Order Specific Question:   Physician/Practitioner attestation of informed consent for procedure/surgical case    Answer:   I, the physician/practitioner, attest that I have discussed with the patient the benefits, risks, side effects, alternatives, likelihood of achieving goals and potential problems during recovery for the procedure that I have provided informed consent.    Order Specific Question:   Procedure    Answer:   Lumbar Facet Block  under fluoroscopic guidance    Order Specific Question:   Physician/Practitioner performing the procedure    Answer:   Nilesh Stegall A. Dossie Arbour MD    Order Specific Question:   Indication/Reason    Answer:   Low Back Pain, with our without leg pain, due to Facet Joint Arthralgia (Joint Pain) Spondylosis (Arthritis of the Spine), without myelopathy or radiculopathy (Nerve Damage).   Care order/instruction: Please confirm that the patient has stopped the Coumadin (Warfarin) X 5 days prior to procedure or surgery.    Please confirm that the patient has stopped  the Coumadin (Warfarin) X 5 days prior to procedure or surgery.    Standing Status:   Standing    Number of Occurrences:   1   Provide  equipment / supplies at bedside    "Block Tray" (Disposable  single use) Needle type: SpinalSpinal Amount/quantity: 4 Size: Medium (5-inch) Gauge: 22G    Standing Status:   Standing    Number of Occurrences:   1    Order Specific Question:   Specify    Answer:   Block Tray   Bleeding precautions    Standing Status:   Standing    Number of Occurrences:   1    Chronic Opioid Analgesic:  Oxycodone IR 10 mg, 1 tab PO qd,  from PCP. No chronic opioid analgesics therapy prescribed by our practice, 2ry to Medication Agreement Violation. (Getting oxycodone from PCP in multiple occasions, according to PMP)(11/21/20; 12/26/20; 01/16/21; 02/13/21). Opioid analgesic pharmacotherapy D/C'ed on 03/03/2021. MME: 20 mg/day.   Medications ordered for procedure: Meds ordered this encounter  Medications   lidocaine (XYLOCAINE) 2 % (with pres) injection 400 mg   midazolam (VERSED) 5 MG/5ML injection 0.5-2 mg    Make sure Flumazenil is available in the pyxis when using this medication. If oversedation occurs, administer 0.2 mg IV over 15 sec. If after 45 sec no response, administer 0.2 mg again over 1 min; may repeat at 1 min intervals; not to exceed 4 doses (1 mg)   ropivacaine (PF) 2 mg/mL (0.2%) (NAROPIN) injection 18 mL   triamcinolone acetonide (KENALOG-40) injection 80 mg    Medications administered: We administered lidocaine, midazolam, ropivacaine (PF) 2 mg/mL (0.2%), and triamcinolone acetonide.  See the medical record for exact dosing, route, and time of administration.  Follow-up plan:   Return in about 2 weeks (around 05/19/2021) for Proc-day (T,Th), (VV), (PPE).       Interventional Therapies  Risk  Complexity  Complicating Factors:   Estimated body mass index is 36.49 kg/m as calculated from the following:   Height as of this encounter: 5\' 3"  (1.6 m).   Weight as of this encounter: 206 lb (93.4 kg). 1.  Coumadin anticoagulation  (Stop: 5 days  Re-start: 2hrs) 2.  Latex allergy  3.   Morbid obesity (class III) (BMI>40)  4.  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   Under consideration:   Palliative procedures until BMI<35.   Completed:   Palliative/Therapeutic left L4-5 LESI x1 (11/10/2017) Palliative left lumbar facet block x14 (06/24/2020) Palliative right lumbar facet block(s) x19 (08/05/2020) Palliative right SI joint block x8 (not as effective as facet Blk) (03/08/2019) Diagnostic left SI joint block x1 (not as effective as facet Blk) (03/08/2019) Palliative/Therapeutic (Midline) caudal ESI x3 (06/08/2018)   Palliative options:   Palliative/Therapeutic left L4-5 LESI #2   Palliative left lumbar facet block #15  Palliative right lumbar facet block(s) #19  Palliative right SI joint block #9  Diagnostic left SI joint block #2  Palliative/Therapeutic (Midline) caudal ESI #4      Recent Visits Date Type Provider Dept  04/20/21 Office Visit Milinda Pointer, MD [name]  03/03/21 Procedure visit Milinda Pointer, MD [name]  02/24/21 Office Visit Milinda Pointer, MD [name]  Showing recent visits within past 90 days and meeting all other requirements Today's Visits Date Type Provider Dept  05/05/21 Procedure visit Milinda Pointer, MD  [name]  Showing today's visits and meeting all other requirements Future Appointments Date Type Provider Dept  05/20/21 Appointment Milinda Pointer, MD [name]  Showing future appointments within next 90 days and meeting all other requirements Disposition: Discharge home  Discharge (Date  Time): 05/05/2021; 0945 hrs.   Primary Care Physician: Tracie Harrier, MD Location: [facility] Note by: Gaspar Cola, MD Date: 05/05/2021; Time: 10:02 AM  Disclaimer:  Medicine is not an Chief Strategy Officer. The only  guarantee in medicine is that nothing is guaranteed. It is important to note that the decision to proceed with this intervention was based on the information collected from the patient. The Data and conclusions were drawn from the patient's questionnaire, the interview, and the physical examination. Because the information was provided in large part by the patient, it cannot be guaranteed that it has not been purposely or unconsciously manipulated. Every effort has been made to obtain as much relevant data as possible for this evaluation. It is important to note that the conclusions that lead to this procedure are derived in large part from the available data. Always take into account that the treatment will also be dependent on availability of resources and existing treatment guidelines, considered by other Pain Management Practitioners as being common knowledge and practice, at the time of the intervention. For Medico-Legal purposes, it is also important to point out that variation in procedural techniques and pharmacological choices are the acceptable norm. The indications, contraindications, technique, and results of the above procedure should only be interpreted and judged by a Board-Certified Interventional Pain Specialist with extensive familiarity and expertise in the same exact procedure and technique.

## 2021-05-05 NOTE — Progress Notes (Signed)
Safety precautions to be maintained throughout the outpatient stay will include: orient to surroundings, keep bed in low position, maintain call bell within reach at all times, provide assistance with transfer out of bed and ambulation.  

## 2021-05-05 NOTE — Patient Instructions (Signed)

## 2021-05-06 ENCOUNTER — Ambulatory Visit
Admission: RE | Admit: 2021-05-06 | Discharge: 2021-05-06 | Disposition: A | Discharge status: Home / Self Care | Payer: PPO | Source: Ambulatory Visit | Attending: Orthopedic Surgery | Admitting: Orthopedic Surgery

## 2021-05-06 ENCOUNTER — Telehealth: Payer: Self-pay

## 2021-05-06 DIAGNOSIS — M4316 Spondylolisthesis, lumbar region: Secondary | ICD-10-CM | POA: Diagnosis not present

## 2021-05-06 DIAGNOSIS — M545 Low back pain, unspecified: Secondary | ICD-10-CM | POA: Insufficient documentation

## 2021-05-06 NOTE — Telephone Encounter (Signed)
Post procedure phone call.  Patient states she is doing well.  

## 2021-05-07 ENCOUNTER — Telehealth: Payer: Self-pay | Admitting: Pain Medicine

## 2021-05-07 NOTE — Telephone Encounter (Signed)
Patient called stating that her BMI has gone down and she would like to have a procedure.  I explained that she will need to discuss this with FN at her next appt which is 05/20/21.  Patient verbalizes u/o information.

## 2021-05-13 ENCOUNTER — Encounter: Payer: Self-pay | Admitting: Radiation Oncology

## 2021-05-13 ENCOUNTER — Other Ambulatory Visit: Payer: Self-pay

## 2021-05-13 ENCOUNTER — Ambulatory Visit
Admission: RE | Admit: 2021-05-13 | Discharge: 2021-05-13 | Disposition: A | Discharge status: Home / Self Care | Payer: PPO | Source: Ambulatory Visit | Attending: Radiation Oncology | Admitting: Radiation Oncology

## 2021-05-13 VITALS — BP 131/59 | HR 82 | Temp 97.2°F | Wt 211.4 lb

## 2021-05-13 DIAGNOSIS — C3411 Malignant neoplasm of upper lobe, right bronchus or lung: Secondary | ICD-10-CM | POA: Diagnosis not present

## 2021-05-13 DIAGNOSIS — Z923 Personal history of irradiation: Secondary | ICD-10-CM | POA: Diagnosis not present

## 2021-05-13 NOTE — Progress Notes (Signed)
Radiation Oncology Follow up Note  Name: Kara [name]   Date:   05/13/2021 MRN:  [id number] DOB: [birth year]    This 70 y.o. female presents to the clinic today for 1 month follow-up status post SBRT to her right upper lobe.  REFERRING PROVIDER: Tracie Harrier, MD  HPI: Patient is a 70 year old female now out 1 month having completed SBRT to her right upper lobe for stage I non-small cell lung cancer.  Seen today in routine follow-up she is doing well specifically denies cough chest tightness or any change in her pulmonary status.  She is having no fatigue..  COMPLICATIONS OF TREATMENT: none  FOLLOW UP COMPLIANCE: keeps appointments   PHYSICAL EXAM:  BP (!) 131/59    Pulse 82    Temp (!) 97.2 F (36.2 C) (Tympanic)    Wt 211 lb 6.4 oz (95.9 kg)    BMI 37.45 kg/m  Well-developed well-nourished patient in NAD. HEENT reveals PERLA, EOMI, discs not visualized.  Oral cavity is clear. No oral mucosal lesions are identified. Neck is clear without evidence of cervical or supraclavicular adenopathy. Lungs are clear to A&P. Cardiac examination is essentially unremarkable with regular rate and rhythm without murmur rub or thrill. Abdomen is benign with no organomegaly or masses noted. Motor sensory and DTR levels are equal and symmetric in the upper and lower extremities. Cranial nerves II through XII are grossly intact. Proprioception is intact. No peripheral adenopathy or edema is identified. No motor or sensory levels are noted. Crude visual fields are within normal range.  RADIOLOGY RESULTS: No current films for review  PLAN: Present time patient is doing extremely well with very low side effect profile from SBRT.  Of asked to see her back in 3 months for follow-up with a CT scan of her chest prior to that visit.  Patient is to call with any concerns.  I would like to take this opportunity to thank you for allowing me to participate in the care of your patient.Noreene Filbert,  MD

## 2021-05-19 ENCOUNTER — Telehealth: Payer: Self-pay | Admitting: *Deleted

## 2021-05-19 NOTE — Progress Notes (Signed)
Unsuccessful attempt to contact patient for Virtual Visit (Pain Management Telehealth)   Patient provided contact information:  [phone] (home); [phone] (mobile); (Preferred) [phone] [email or web address]   Pre-screening:  Our staff was successful in contacting Kara [name] using the above provided information.   I unsuccessfully attempted to make contact with Kara [name] on 3 different occasions on 05/20/2021 via telephone. I was unable to complete the virtual encounter due to call going directly to voicemail. I was able to leave a message where I clearly identify myself as Gaspar Cola, MD and I left a message to call us back to reschedule the call.  Post-procedure evaluation  This information was obtained by the nursing staff during the precharting call. Procedure:          Type: Lumbar Facet, Medial Branch Block(s)          Primary Purpose: Diagnostic/Therapeutic Region: Posterolateral Lumbosacral Spine Level: L2, L3, L4, L5, & S1 Medial Branch Level(s). Injecting these levels blocks the L3-4, L4-5, and L5-S1 lumbar facet joints. Laterality: Bilateral Anesthesia: Local (1-2% Lidocaine)  Anxiolysis: IV  Sedation: None  Guidance: Fluoroscopy          Indications: 1. Lumbar facet syndrome (Bilateral)   2. Spondylosis without myelopathy or radiculopathy, lumbosacral region   3. 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 DDD (degenerative disc disease), lumbosacral   6. Lumbar facet arthropathy (L3-4, L4-5, and L5-S1) (Bilateral)   7. Chronic low back pain (1ry area of Pain) (Bilateral) w/o sciatica   8. Chronic anticoagulation (COUMADIN)   9. Severe obesity (BMI 35.0-39.9) with comorbidity ([facility])   10. Osteoarthritis of lumbar spine   11. 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 Acute exacerbation of chronic low back pain    Pain Score: Pre-procedure: 10-Worst pain  ever/10 Post-procedure: 0-No pain/10     Effectiveness:  Initial hour after procedure: 100 %. Subsequent 4-6 hours post-procedure: 100 %. Analgesia past initial 6 hours: 75 % (current). Ongoing improvement:  Analgesic: Ongoing 75% improvement of the low back pain  Pharmacotherapy Assessment  Analgesic: Oxycodone IR 10 mg, 1 tab PO qd,  from PCP. No chronic opioid analgesics therapy prescribed by our practice, 2ry to Medication Agreement Violation. (Getting oxycodone from PCP in multiple occasions, according to PMP)(11/21/20; 12/26/20; 01/16/21; 02/13/21). Opioid analgesic pharmacotherapy D/C'ed on 03/03/2021. MME: 20 mg/day.   Follow-up plan:   Reschedule Visit.    Interventional Therapies  Risk   Complexity   Complicating Factors:   Estimated body mass index is 36.49 kg/m as calculated from the following:   Height as of this encounter: 5\' 3"  (1.6 m).   Weight as of this encounter: 206 lb (93.4 kg). 1.  Coumadin anticoagulation  (Stop: 5 days   Re-start: 2hrs) 2.  Latex allergy  3.  Morbid obesity (class III) (BMI>40)  4.  No Lumbar RFA due to morbid obesity    Planned   Pending:   Therapeutic radiofrequency ablation of the lumbar facets once her BMI is below 35.   Under consideration:   Palliative procedures until BMI<35.   Completed:   Palliative/Therapeutic left L4-5 LESI x1 (11/10/2017) Palliative left lumbar facet block x14 (06/24/2020) Palliative right lumbar facet block(s) x19 (08/05/2020) Palliative right SI joint block x8 (not as effective as facet Blk) (03/08/2019) Diagnostic left SI joint block x1 (not as effective as facet Blk) (03/08/2019) Palliative/Therapeutic (Midline) caudal ESI x3 (06/08/2018)  Palliative options:   Palliative/Therapeutic left L4-5 LESI #2   Palliative left lumbar facet block #15  Palliative right lumbar facet block(s) #19  Palliative right SI joint block #9  Diagnostic left SI joint block #2  Palliative/Therapeutic (Midline) caudal ESI #4        Recent Visits Date Type Provider Dept  05/05/21 Procedure visit Milinda Pointer, MD [name]  04/20/21 Office Visit Milinda Pointer, MD [name]  03/03/21 Procedure visit Milinda Pointer, MD [name]  02/24/21 Office Visit Milinda Pointer, MD [name]  Showing recent visits within past 90 days and meeting all other requirements Today's Visits Date Type Provider Dept  05/20/21 Office Visit Milinda Pointer, MD [name]  Showing today's visits and meeting all other requirements Future Appointments No visits were found meeting these conditions. Showing future appointments within next 90 days and meeting all other requirements   Note by: Gaspar Cola, MD Date: 05/20/2021; Time: 6:23 PM

## 2021-05-19 NOTE — Telephone Encounter (Signed)
Called for pre visit nurse call. No answer. LVM.

## 2021-05-20 ENCOUNTER — Other Ambulatory Visit: Payer: Self-pay

## 2021-05-20 ENCOUNTER — Ambulatory Visit: Payer: PPO | Attending: Pain Medicine | Admitting: Pain Medicine

## 2021-05-20 DIAGNOSIS — M4310 Spondylolisthesis, site unspecified: Secondary | ICD-10-CM

## 2021-05-20 DIAGNOSIS — G8929 Other chronic pain: Secondary | ICD-10-CM

## 2021-05-20 DIAGNOSIS — M47816 Spondylosis without myelopathy or radiculopathy, lumbar region: Secondary | ICD-10-CM

## 2021-05-20 DIAGNOSIS — M545 Low back pain, unspecified: Secondary | ICD-10-CM

## 2021-05-26 NOTE — Progress Notes (Signed)
Patient: Kara [name]  Service Category: E/M  Provider: Gaspar Cola, MD  DOB: [birth year]  DOS: 05/27/2021  Location: Office  MRN: [id number]  Setting: Ambulatory outpatient  Referring Provider: Tracie Harrier, MD  Type: Established Patient  Specialty: Interventional Pain Management  PCP: Kara Harrier, MD  Location: Remote location  Delivery: TeleHealth     Virtual Encounter - Pain Management PROVIDER NOTE: Information contained herein reflects review and annotations entered in association with encounter. Interpretation of such information and data should be left to medically-trained personnel. Information provided to patient can be located elsewhere in the medical record under "Patient Instructions". Document created using STT-dictation technology, any transcriptional errors that may result from process are unintentional.    Contact & Pharmacy Preferred: [phone] Home: [phone] (home) Mobile: [phone] (mobile) E-mail: [email or web address]  [place], [address] [place] [address] Phone: [phone] Fax: [phone]   Pre-screening  Kara [name] offered "in-person" vs "virtual" encounter. She indicated preferring virtual for this encounter.   Reason COVID-19*   Social distancing based on CDC and AMA recommendations.   I contacted Kara [name] on 05/27/2021 via telephone.      I clearly identified myself as Kara Cola, MD. I verified that I was speaking with the correct person using two identifiers (Name: Kara [name], and date of birth: August 10, 1950).  Consent I sought verbal advanced consent from Kara [name] for virtual visit interactions. I informed Kara [name] of possible security and privacy concerns, risks, and limitations associated with providing "not-in-person" medical evaluation and management services. I also informed Kara [name] of the availability of "in-person"  appointments. Finally, I informed her that there would be a charge for the virtual visit and that she could be  personally, fully or partially, financially responsible for it. Kara [name] expressed understanding and agreed to proceed.   Historic Elements   Kara [name] is a 71 y.o. year old, female patient evaluated today after our last contact on 05/20/2021. 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 She also  has a past surgical history that includes Knee surgery; Carpal tunnel release (Bilateral); Colon resection sigmoid (N/A, 11/02/2016); Colostomy (N/A, 11/02/2016); Sigmoidoscopy (N/A, 11/13/2016); PULMONARY VENOGRAPHY (N/A, 11/19/2016); Colonoscopy with propofol (N/A, 02/03/2017); Colon surgery (11/02/2016); IVC FILTER INSERTION (Right, 10/2016); Colostomy reversal (N/A, 02/24/2017); Appendectomy (N/A, 02/24/2017); Lysis of adhesion (N/A, 02/24/2017); laparoscopy (N/A, 02/24/2017); Ileo loop diversion (N/A, 02/24/2017); Sigmoidoscopy (N/A, 05/19/2017); Ileostomy closure (N/A, 06/01/2017); IVC FILTER REMOVAL (N/A, 08/09/2017); Sacroplasty (N/A, 11/08/2017); Cataract extraction w/PHACO (Left, 11/21/2018); Cataract extraction w/PHACO (Right, 12/12/2018); and Video bronchoscopy with endobronchial navigation (N/A, 02/23/2021). Kara [name] has a current medication list which includes the following prescription(s): alendronate, calcium carbonate, candesartan, cholecalciferol, vitamin b-12, cyclobenzaprine, dulaglutide, gabapentin, gabapentin, glipizide, glipizide, hydrocortisone, ibuprofen, meclizine, multivitamin with minerals, ropinirole, tolterodine, tramadol, warfarin, and warfarin. She  reports that she has never smoked. She has  never used smokeless tobacco. She reports that she does not drink alcohol and does not use drugs. Kara [name] is allergic to adhesive [tape], morphine, other, latex, and morphine and related.   HPI  Today, she is being contacted for a post-procedure assessment.  The patient again attained 100% relief of the pain for the duration of the local anesthetic, followed  by a decrease to a 75% which has now increased to an 85% that seems to be ongoing according to the patient.  Today the patient has reminded me that she has been working on her weight and she has been able to bring her BMI down close to 35 kg/m and she was wondering if we could go ahead and schedule the radiofrequency.  I think this would be a great idea since she does well with these injections but they do not last long.  I am I tried to the radiofrequency and get longer lasting benefit so that we can cut down significantly on her steroid exposure.  This by the fact that the medical record indicates that the patient is BMI is 37, he refers that she has been losing weight and currently is in the 35 range.  If this is the case, I have agreed to proceed with the radiofrequency.  Up to this point, we have refrained from doing a radiofrequency based on her morbid obesity.  Post-procedure evaluation     Procedure:          Type: Lumbar Facet, Medial Branch Block(s)          Primary Purpose: Diagnostic/Therapeutic Region: Posterolateral Lumbosacral Spine Level: L2, L3, L4, L5, & S1 Medial Branch Level(s). Injecting these levels blocks the L3-4, L4-5, and L5-S1 lumbar facet joints. Laterality: Bilateral Anesthesia: Local (1-2% Lidocaine)  Anxiolysis: IV  Sedation: None  Guidance: Fluoroscopy          Indications: 1. Lumbar facet syndrome (Bilateral)   2. Spondylosis without myelopathy or radiculopathy, lumbosacral region   3. 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 DDD (degenerative  disc disease), lumbosacral   6. Lumbar facet arthropathy (L3-4, L4-5, and L5-S1) (Bilateral)   7. Chronic low back pain (1ry area of Pain) (Bilateral) w/o sciatica   8. Chronic anticoagulation (COUMADIN)   9. Severe obesity (BMI 35.0-39.9) with comorbidity ([place])   10. Osteoarthritis of lumbar spine   11. 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 Acute exacerbation of chronic low back pain    Pain Score: Pre-procedure: 10-Worst pain ever/10 Post-procedure: 0-No pain/10    Effectiveness:  Initial hour after procedure: 100 %. Subsequent 4-6 hours post-procedure: 100 %. Analgesia past initial 6 hours: 75 %. Ongoing improvement:  Analgesic: The patient indicates currently having an ongoing 85% relief of her pain Function: Ms. Fite reports improvement in function ROM: [name] reports improvement in ROM  Statement of Medical Necessity:  Ms. Stilwellcontinues to experienced debilitating chronic nerve-associated pain from the Lumbosacral Facet Syndrome (Spondylosis without myelopathy or radiculopathy, lumbosacral region [S97.026]).  Duration: This pain has persisted for longer than three months.  Non-surgical care: The patient has either failed to respond, or was unable to tolerate, or simply did not get enough benefit from other more conservative therapies including, but not limited to: 1. Over-the-counter oral analgesic medications (i.e.: ibuprofen, naproxen, etc.) 2. Anti-inflammatory medications 3. Muscle relaxants 4. Membrane stabilizers 5. Opioids 6. Physical therapy (PT), chiropractic manipulation, and/or home exercise program (HEP). 7. Modalities (Heat, ice, etc.)  Invasive therapies: Nerve blocks have failed to provide any significant long-term benefit.  Surgical care: Not indicated.  Physical exam: Has been consistent with Lumbosacral Facet Syndrome.  Diagnostic imaging: Lumbosacral Facet Arthropathy.   Diagnostic interventional therapies:  Ms. Buchholz has attained greater than 50% reduction in pain from at least two (2) diagnostic  medial branch blocks conducted in separate occasions.   For the above listed reason, I believe, as the examining and treating physician, that it is medically necessary to proceed with Non-Pulsed Radiofrequency Ablation for the purpose of attempting to prolong the duration of the benefits seen with the diagnostic injections.  At this point, after having personally evaluated this patient and being the treating attending physician, it is my professional opinion, as a board-certified pain management specialist, that it is medically necessary for this patient to undergo the treatments that I am ordering.  Any attempt at denying these treatments, by a health care insurance entity, represents a case of "practicing medicine without a license", which is currently illegal in the state of [place].  Furthermore, if this company attempts to use a Mudlogger" to deny such treatment without having performed a face-to-face evaluation, represents an act of gross negligence and malpractice.   Pharmacotherapy Assessment   Opioid Analgesic: Oxycodone IR 10 mg, 1 tab PO qd,  from PCP. No chronic opioid analgesics therapy prescribed by our practice, 2ry to Medication Agreement Violation. (Getting oxycodone from PCP in multiple occasions, according to PMP)(11/21/20; 12/26/20; 01/16/21; 02/13/21). Opioid analgesic pharmacotherapy D/C'ed on 03/03/2021. MME: 20 mg/day.   Monitoring: [place] PMP: PDMP reviewed during this encounter.       Pharmacotherapy: No side-effects or adverse reactions reported. Compliance: No problems identified. Effectiveness: Clinically acceptable. Plan: Refer to "POC". UDS:  Summary  Date Value Ref Range Status  11/12/2020 Note  Final    Comment:    ==================================================================== [employer] Select 13  (MW) ==================================================================== Test                             Result       Flag       Units  Drug Present and Declared for Prescription Verification   Norhydrocodone                 61           EXPECTED   ng/mg creat    Norhydrocodone is an expected metabolite of hydrocodone.    Tramadol                       >4202        EXPECTED   ng/mg creat   O-Desmethyltramadol            >4202        EXPECTED   ng/mg creat   N-Desmethyltramadol            1712         EXPECTED   ng/mg creat    Source of tramadol is a prescription medication. O-desmethyltramadol    and N-desmethyltramadol are expected metabolites of tramadol.  Drug Absent but Declared for Prescription Verification   Hydrocodone                    Not Detected UNEXPECTED ng/mg creat    Hydrocodone is almost always present in patients taking this drug    consistently. Absence of hydrocodone could be due to lapse of time    since the last dose or unusual pharmacokinetics (rapid metabolism).  ==================================================================== Test                      Result    Flag   Units      Ref Range   Creatinine  119              mg/dL      >=20 ==================================================================== Declared Medications:  The flagging and interpretation on this report are based on the  following declared medications.  Unexpected results may arise from  inaccuracies in the declared medications.   **Note: The testing scope of this panel includes these medications:   Hydrocodone  Tramadol   **Note: The testing scope of this panel does not include the  following reported medications:   Acetaminophen  Alendronate (Fosamax)  Aspirin  Calcium  Candesartan  Cholecalciferol  Cyanocobalamin  Cyclobenzaprine  Dulaglutide  Gabapentin  Glipizide  Meclizine  Multivitamin  Nystatin  Ondansetron  Ropinirole  Tolterodine   Warfarin ==================================================================== For clinical consultation, please call [phone]. ====================================================================      Laboratory Chemistry Profile   Renal Lab Results  Component Value Date   BUN 16 02/19/2021   CREATININE 0.70 02/19/2021   GFRAA >60 07/09/2019   GFRNONAA >60 02/19/2021    Hepatic Lab Results  Component Value Date   AST 23 02/19/2021   ALT 27 02/19/2021   ALBUMIN 3.6 02/19/2021   ALKPHOS 43 02/19/2021   LIPASE 38 08/11/2020    Electrolytes Lab Results  Component Value Date   NA 139 02/19/2021   K 3.5 02/19/2021   CL 106 02/19/2021   CALCIUM 9.0 02/19/2021   MG 1.7 08/14/2020   PHOS 3.3 11/07/2016    Bone Lab Results  Component Value Date   25OHVITD1 25 (L) 11/02/2017   25OHVITD2 <1.0 11/02/2017   25OHVITD3 25 11/02/2017    Inflammation (CRP: Acute Phase) (ESR: Chronic Phase) Lab Results  Component Value Date   CRP 1.7 (H) 08/15/2020   ESRSEDRATE 12 11/02/2017         Note: Above Lab results reviewed.  Imaging  MR LUMBAR SPINE WO CONTRAST CLINICAL DATA:  Acute midline low back pain without sciatica M54.50 (ICD-10-CM)  EXAM: MRI LUMBAR SPINE WITHOUT CONTRAST  TECHNIQUE: Multiplanar, multisequence MR imaging of the lumbar spine was performed. No intravenous contrast was administered.  COMPARISON:  MRI lumbar spine August 08, 2019.  FINDINGS: Segmentation: Same numbering as on the prior MRI. Rudimentary S1-S2 disc.  Alignment: Similar grade 1 anterolisthesis L4 on L5 and L5-S1. Otherwise, no substantial sagittal subluxation.  Vertebrae: Vertebral body heights are unchanged. No focal marrow edema to suggest acute fracture or discitis/osteomyelitis. Scattered benign vertebral venous malformations, largest at L1. No suspicious bony lesions. Chronic pars defects better characterized on prior CT.  Conus medullaris and cauda equina: Conus extends  to the L1-L2 level. Conus appears normal.  Paraspinal and other soft tissues: Unremarkable.  Disc levels:  T10-T11: Only imaged sagittally. There appears to be a small superiorly dissecting right subarticular disc protrusion without evidence of significant canal or foraminal stenosis.  T11-T12: Only imaged sagittally. Small disc bulge without significant canal or foraminal stenosis.  T12-L1: Mild disc bulge without significant canal or foraminal stenosis.  L1-L2: Mild disc bulge without significant canal or foraminal stenosis.  L2-L3: Facet arthropathy with ligamentum flavum thickening, similar. No significant canal or foraminal stenosis. Similar.  L3-L4: Disc bulging with ligamentum flavum thickening and bilateral facet arthropathy. Similar mild canal stenosis. Similar mild-to-moderate bilateral foraminal stenosis.  L4-L5: Similar grade 1 anterolisthesis of L4 on L5. Associated uncovering of the disc. Similar mild canal stenosis. Bilateral facet arthropathy, similar moderate right and severe left foraminal stenosis.  L5-S1: Similar grade 1 anterolisthesis. Uncovering the disc with superimposed central/left paracentral disc protrusion,  similar. Bilateral facet arthropathy. Ligamentum flavum thickening. Similar moderate canal stenosis with severe narrowing of the subarticular recesses. Similar severe bilateral foraminal stenosis.  IMPRESSION: 1. At L5-S1, similar moderate canal stenosis with severe bilateral subarticular recess and foraminal stenosis. 2. At L4-L5, similar severe left and moderate right foraminal stenosis with mild canal stenosis. For 3. At L3-L4, similar mild to moderate bilateral foraminal stenosis and mild canal stenosis. 4. Similar grade 1 anterolisthesis of L4 on L5 and L5 on S1.  Electronically Signed   By: Margaretha Sheffield M.D.   On: 05/06/2021 09:22  Assessment  The primary encounter diagnosis was Chronic low back pain (1ry area of Pain)  (Bilateral) w/o sciatica. 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  Plan of Care  Problem-specific:  No problem-specific Assessment & Plan notes found for this encounter.  Ms. Loreley Schwall Deuser has a current medication list which includes the following long-term medication(s): calcium carbonate, candesartan, glipizide, glipizide, ropinirole, warfarin, and warfarin.  Pharmacotherapy (Medications Ordered): No orders of the defined types were placed in this encounter.  Orders:  Orders Placed This Encounter  Procedures   Radiofrequency,Lumbar    Standing Status:   Future    Standing Expiration Date:   08/25/2021    Scheduling Instructions:     Side(s): Right-sided     Level: L3-4, L4-5, & L5-S1 Facets (L2, L3, L4, L5, & S1 Medial Branch Nerves)     Sedation: Patient's choice.     Scheduling Timeframe: As soon as pre-approved    Order Specific Question:   Where will this procedure be performed?    Answer:   [name]   Blood Thinner Instructions to Nursing    Always make sure patient has clearance from prescribing physician to stop blood thinners for interventional therapies. If the patient requires a Lovenox-bridge therapy, make sure arrangements are made to institute it with the assistance of the PCP.    Scheduling Instructions:     Have Ms. Blunck stop the Coumadin (Warfarin) X 5 days prior to procedure or surgery.   Follow-up plan:   Return for (63mn), (ECT) procedure: (R) L-FCT RFA #1, (Sed-anx), (Blood Thinner Protocol).     Interventional Therapies  Risk   Complexity Considerations:   Estimated body mass index is 37.45 kg/m  as calculated from the following:   Height as of 05/05/21: '5\' 3"'  (1.6 m).   Weight as of 05/13/21: 211 lb 6.4 oz (95.9 kg). 1.  Coumadin anticoagulation  (Stop: 5 days   Re-start: 2hrs) 2.  Latex allergy  3.  Morbid obesity (class III) (BMI>40)  4.  No Lumbar RFA due to morbid obesity    Planned   Pending:   Therapeutic bilateral lumbar facet RFA #1 (starting with the right side)   Under consideration:   Therapeutic bilateral lumbar facet RFA #1 (starting with the right side)   Completed:   Palliative/Therapeutic left L4-5 LESI x1 (11/10/2017) (100/80/0/0-25)  Palliative left lumbar facet MBB x17 (05/05/2021) (100/100/75/85)  Palliative right lumbar facet MBB x23 (05/05/2021) (100/100/75/85)  Palliative right SI joint block x8 (not effective) (03/08/2019) (100/100/100/0)  Diagnostic left SI joint block x1 (not effective) (03/08/2019) (100/100/100/0)  Palliative/Therapeutic (Midline) caudal ESI x3 (06/08/2018) (100/100/100 x 2-3 days/0)  Therapeutic   Palliative (PRN) options:   Palliative lumbar facet MBB     Recent Visits Date Type Provider Dept  05/20/21 Office Visit Milinda Pointer, MD [name]  05/05/21 Procedure visit Milinda Pointer, MD [name]  04/20/21 Office Visit Milinda Pointer, MD [name]  03/03/21 Procedure visit Milinda Pointer, MD [name]  Showing recent visits within past 90 days and meeting all other requirements Today's Visits Date Type Provider Dept  05/27/21 Office Visit Milinda Pointer, MD [name]  Showing today's visits and meeting all other requirements Future Appointments No visits were found meeting these conditions. Showing future appointments within next 90 days and meeting all other requirements  I discussed the assessment and treatment plan with the patient. The patient was provided an opportunity to ask questions and all were answered. The patient agreed with the  plan and demonstrated an understanding of the instructions.  Patient advised to call back or seek an in-person evaluation if the symptoms or condition worsens.  Duration of encounter: 15 minutes.  Note by: Kara Cola, MD Date: 05/27/2021; Time: 5:58 PM

## 2021-05-27 ENCOUNTER — Other Ambulatory Visit: Payer: Self-pay

## 2021-05-27 ENCOUNTER — Ambulatory Visit: Payer: PPO | Attending: Pain Medicine | Admitting: Pain Medicine

## 2021-05-27 DIAGNOSIS — M5137 Other intervertebral disc degeneration, lumbosacral region: Secondary | ICD-10-CM | POA: Diagnosis not present

## 2021-05-27 DIAGNOSIS — M4306 Spondylolysis, lumbar region: Secondary | ICD-10-CM | POA: Diagnosis not present

## 2021-05-27 DIAGNOSIS — Z7901 Long term (current) use of anticoagulants: Secondary | ICD-10-CM

## 2021-05-27 DIAGNOSIS — M545 Low back pain, unspecified: Secondary | ICD-10-CM

## 2021-05-27 DIAGNOSIS — M47816 Spondylosis without myelopathy or radiculopathy, lumbar region: Secondary | ICD-10-CM | POA: Diagnosis not present

## 2021-05-27 DIAGNOSIS — M4310 Spondylolisthesis, site unspecified: Secondary | ICD-10-CM

## 2021-05-27 DIAGNOSIS — M47817 Spondylosis without myelopathy or radiculopathy, lumbosacral region: Secondary | ICD-10-CM | POA: Diagnosis not present

## 2021-05-27 DIAGNOSIS — G8929 Other chronic pain: Secondary | ICD-10-CM | POA: Diagnosis not present

## 2021-05-29 ENCOUNTER — Telehealth: Payer: Self-pay | Admitting: Pain Medicine

## 2021-05-29 NOTE — Telephone Encounter (Signed)
Pre procedure instructions given.

## 2021-06-04 DIAGNOSIS — M5416 Radiculopathy, lumbar region: Secondary | ICD-10-CM | POA: Diagnosis not present

## 2021-06-04 DIAGNOSIS — M4310 Spondylolisthesis, site unspecified: Secondary | ICD-10-CM | POA: Diagnosis not present

## 2021-06-23 ENCOUNTER — Ambulatory Visit: Admission: RE | Admit: 2021-06-23 | Payer: PPO | Source: Ambulatory Visit

## 2021-06-26 ENCOUNTER — Inpatient Hospital Stay: Payer: PPO | Admitting: Oncology

## 2021-06-29 DIAGNOSIS — E1169 Type 2 diabetes mellitus with other specified complication: Secondary | ICD-10-CM | POA: Diagnosis not present

## 2021-06-29 DIAGNOSIS — E1142 Type 2 diabetes mellitus with diabetic polyneuropathy: Secondary | ICD-10-CM | POA: Diagnosis not present

## 2021-06-29 DIAGNOSIS — Z923 Personal history of irradiation: Secondary | ICD-10-CM | POA: Diagnosis not present

## 2021-06-29 DIAGNOSIS — I10 Essential (primary) hypertension: Secondary | ICD-10-CM | POA: Diagnosis not present

## 2021-06-29 DIAGNOSIS — Z6835 Body mass index (BMI) 35.0-35.9, adult: Secondary | ICD-10-CM | POA: Diagnosis not present

## 2021-06-29 DIAGNOSIS — E785 Hyperlipidemia, unspecified: Secondary | ICD-10-CM | POA: Diagnosis not present

## 2021-06-29 DIAGNOSIS — Z7984 Long term (current) use of oral hypoglycemic drugs: Secondary | ICD-10-CM | POA: Diagnosis not present

## 2021-06-29 DIAGNOSIS — Z7985 Long-term (current) use of injectable non-insulin antidiabetic drugs: Secondary | ICD-10-CM | POA: Diagnosis not present

## 2021-06-29 DIAGNOSIS — Z85118 Personal history of other malignant neoplasm of bronchus and lung: Secondary | ICD-10-CM | POA: Diagnosis not present

## 2021-06-29 DIAGNOSIS — D692 Other nonthrombocytopenic purpura: Secondary | ICD-10-CM | POA: Diagnosis not present

## 2021-07-01 DIAGNOSIS — I10 Essential (primary) hypertension: Secondary | ICD-10-CM | POA: Diagnosis not present

## 2021-07-01 DIAGNOSIS — C3411 Malignant neoplasm of upper lobe, right bronchus or lung: Secondary | ICD-10-CM | POA: Diagnosis not present

## 2021-07-01 DIAGNOSIS — E1165 Type 2 diabetes mellitus with hyperglycemia: Secondary | ICD-10-CM | POA: Diagnosis not present

## 2021-07-01 DIAGNOSIS — Z6841 Body Mass Index (BMI) 40.0 and over, adult: Secondary | ICD-10-CM | POA: Diagnosis not present

## 2021-07-01 DIAGNOSIS — R8290 Unspecified abnormal findings in urine: Secondary | ICD-10-CM | POA: Diagnosis not present

## 2021-07-01 DIAGNOSIS — R911 Solitary pulmonary nodule: Secondary | ICD-10-CM | POA: Diagnosis not present

## 2021-07-01 DIAGNOSIS — M5136 Other intervertebral disc degeneration, lumbar region: Secondary | ICD-10-CM | POA: Diagnosis not present

## 2021-07-01 DIAGNOSIS — Z933 Colostomy status: Secondary | ICD-10-CM | POA: Diagnosis not present

## 2021-07-01 DIAGNOSIS — Z0000 Encounter for general adult medical examination without abnormal findings: Secondary | ICD-10-CM | POA: Diagnosis not present

## 2021-07-01 DIAGNOSIS — I700 Atherosclerosis of aorta: Secondary | ICD-10-CM | POA: Diagnosis not present

## 2021-07-01 DIAGNOSIS — Z7901 Long term (current) use of anticoagulants: Secondary | ICD-10-CM | POA: Diagnosis not present

## 2021-07-07 ENCOUNTER — Ambulatory Visit
Admission: RE | Admit: 2021-07-07 | Discharge: 2021-07-07 | Disposition: A | Discharge status: Home / Self Care | Payer: PPO | Source: Ambulatory Visit | Attending: Pain Medicine | Admitting: Pain Medicine

## 2021-07-07 ENCOUNTER — Telehealth: Payer: Self-pay | Admitting: Pain Medicine

## 2021-07-07 ENCOUNTER — Ambulatory Visit (HOSPITAL_BASED_OUTPATIENT_CLINIC_OR_DEPARTMENT_OTHER): Payer: PPO | Admitting: Pain Medicine

## 2021-07-07 ENCOUNTER — Encounter: Payer: Self-pay | Admitting: Pain Medicine

## 2021-07-07 ENCOUNTER — Ambulatory Visit: Payer: PPO | Admitting: Pain Medicine

## 2021-07-07 ENCOUNTER — Other Ambulatory Visit: Payer: Self-pay

## 2021-07-07 VITALS — BP 128/69 | HR 95 | Temp 97.4°F | Resp 16 | Ht 63.0 in | Wt 203.0 lb

## 2021-07-07 DIAGNOSIS — Z7901 Long term (current) use of anticoagulants: Secondary | ICD-10-CM

## 2021-07-07 DIAGNOSIS — M4310 Spondylolisthesis, site unspecified: Secondary | ICD-10-CM

## 2021-07-07 DIAGNOSIS — Z91040 Latex allergy status: Secondary | ICD-10-CM

## 2021-07-07 DIAGNOSIS — M47817 Spondylosis without myelopathy or radiculopathy, lumbosacral region: Secondary | ICD-10-CM

## 2021-07-07 DIAGNOSIS — M545 Low back pain, unspecified: Secondary | ICD-10-CM | POA: Diagnosis not present

## 2021-07-07 DIAGNOSIS — M47816 Spondylosis without myelopathy or radiculopathy, lumbar region: Secondary | ICD-10-CM | POA: Insufficient documentation

## 2021-07-07 DIAGNOSIS — G8929 Other chronic pain: Secondary | ICD-10-CM

## 2021-07-07 DIAGNOSIS — M5137 Other intervertebral disc degeneration, lumbosacral region: Secondary | ICD-10-CM | POA: Diagnosis not present

## 2021-07-07 DIAGNOSIS — G8918 Other acute postprocedural pain: Secondary | ICD-10-CM

## 2021-07-07 MED ORDER — LACTATED RINGERS IV SOLN
1000.0000 mL | Freq: Once | INTRAVENOUS | Status: AC
  Administered 2021-07-07: 1000 mL via INTRAVENOUS

## 2021-07-07 MED ORDER — TRIAMCINOLONE ACETONIDE 40 MG/ML IJ SUSP
40.0000 mg | Freq: Once | INTRAMUSCULAR | Status: AC
  Administered 2021-07-07: 40 mg

## 2021-07-07 MED ORDER — HYDROCODONE-ACETAMINOPHEN 5-325 MG PO TABS: 1.0000 | ORAL_TABLET | Freq: Three times a day (TID) | ORAL | 0 refills | Status: AC | PRN

## 2021-07-07 MED ORDER — MIDAZOLAM HCL 5 MG/5ML IJ SOLN
INTRAMUSCULAR | Status: AC
  Filled 2021-07-07: qty 5

## 2021-07-07 MED ORDER — ROPIVACAINE HCL 2 MG/ML IJ SOLN
9.0000 mL | Freq: Once | INTRAMUSCULAR | Status: AC
  Administered 2021-07-07: 9 mL via PERINEURAL

## 2021-07-07 MED ORDER — FENTANYL CITRATE (PF) 100 MCG/2ML IJ SOLN
25.0000 ug | INTRAMUSCULAR | Status: DC | PRN
  Administered 2021-07-07: 75 ug via INTRAVENOUS

## 2021-07-07 MED ORDER — LIDOCAINE HCL (PF) 2 % IJ SOLN
INTRAMUSCULAR | Status: AC
  Filled 2021-07-07: qty 10

## 2021-07-07 MED ORDER — LIDOCAINE HCL 2 % IJ SOLN
20.0000 mL | Freq: Once | INTRAMUSCULAR | Status: AC
  Administered 2021-07-07: 100 mg

## 2021-07-07 MED ORDER — MIDAZOLAM HCL 5 MG/5ML IJ SOLN
0.5000 mg | Freq: Once | INTRAMUSCULAR | Status: AC
  Administered 2021-07-07: 2.5 mg via INTRAVENOUS

## 2021-07-07 MED ORDER — HYDROCODONE-ACETAMINOPHEN 5-325 MG PO TABS: 1.0000 | ORAL_TABLET | Freq: Three times a day (TID) | ORAL | 0 refills | Status: DC | PRN

## 2021-07-07 MED ORDER — FENTANYL CITRATE (PF) 100 MCG/2ML IJ SOLN
INTRAMUSCULAR | Status: AC
  Filled 2021-07-07: qty 2

## 2021-07-07 MED ORDER — TRIAMCINOLONE ACETONIDE 40 MG/ML IJ SUSP
INTRAMUSCULAR | Status: AC
  Filled 2021-07-07: qty 1

## 2021-07-07 MED ORDER — ROPIVACAINE HCL 2 MG/ML IJ SOLN
INTRAMUSCULAR | Status: AC
  Filled 2021-07-07: qty 20

## 2021-07-07 NOTE — Progress Notes (Signed)
Safety precautions to be maintained throughout the outpatient stay will include: orient to surroundings, keep bed in low position, maintain call bell within reach at all times, provide assistance with transfer out of bed and ambulation.  

## 2021-07-07 NOTE — Progress Notes (Signed)
PROVIDER NOTE: Interpretation of information contained herein should be left to medically-trained personnel. Specific patient instructions are provided elsewhere under "Patient Instructions" section of medical record. This document was created in part using STT-dictation technology, any transcriptional errors that may result from this process are unintentional.  Patient: Kara [name] Type: Established DOB: [birth year] MRN: [id number] PCP: Tracie Harrier, MD  Service: Procedure DOS: 07/07/2021 Setting: Ambulatory Location: Ambulatory outpatient facility Delivery: Face-to-face Provider: Gaspar Cola, MD Specialty: Interventional Pain Management Specialty designation: 09 Location: Outpatient facility Ref. Prov.: Tracie Harrier, MD    Primary Reason for Visit: Interventional Pain Management Treatment. CC: Back Pain  Procedure:           Type: Lumbar Facet, Medial Branch Radiofrequency Ablation (RFA) #1  Laterality: Right  Level: L2, L3, L4, L5, & S1 Medial Branch Level(s). These levels will denervate the L3-4 and L5-S1 lumbar facet joints.  Imaging: Fluoroscopic guidance Anesthesia: Local anesthesia (1-2% Lidocaine) Anxiolysis: IV Versed         Sedation: Moderate conscious sedation. DOS: 07/07/2021  Performed by: Gaspar Cola, MD  Purpose: Therapeutic/Palliative Indications: Low back pain severe enough to impact quality of life or function. Indications: 1. Lumbar facet syndrome (Bilateral)   2. Spondylosis without myelopathy or radiculopathy, lumbosacral region   3. Lumbar facet arthropathy (L3-4, L4-5, and L5-S1) (Bilateral)   4. 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 DDD (degenerative disc disease), lumbosacral   6. Chronic low back pain (1ry area of Pain) (Bilateral) w/o sciatica    Latex precautions, history of latex allergy    Chronic anticoagulation (COUMADIN)    Ms. Dake has been dealing with the above chronic pain for longer than  three months and has either failed to respond, was unable to tolerate, or simply did not get enough benefit from other more conservative therapies including, but not limited to: 1. Over-the-counter medications 2. Anti-inflammatory medications 3. Muscle relaxants 4. Membrane stabilizers 5. Opioids 6. Physical therapy and/or chiropractic manipulation 7. Modalities (Heat, ice, etc.) 8. Invasive techniques such as nerve blocks. Ms. Biswell has attained more than 50% relief of the pain from a series of diagnostic injections conducted in separate occasions.  Pain Score: Pre-procedure: 10-Worst pain ever/10 Post-procedure: 0-No pain/10     Position / Prep / Materials:  Position: Prone  Prep solution: DuraPrep (Iodine Povacrylex [0.7% available iodine] and Isopropyl Alcohol, 74% w/w) Prep Area: Entire Lumbosacral Region (Lower back from mid-thoracic region to end of tailbone and from flank to flank.) Materials:  Tray: RFA (Radiofrequency) tray Needle(s):  Type: RFA (Teflon-coated radiofrequency ablation needles) Gauge (G): 22  Length: Regular (10cm) Qty: 5  Pre-op H&P Assessment:  Ms. Akey is a 71 y.o. (year old), female patient, seen today for interventional treatment. She  has a past surgical history that includes Knee surgery; Carpal tunnel release (Bilateral); Colon resection sigmoid (N/A, 11/02/2016); Colostomy (N/A, 11/02/2016); Sigmoidoscopy (N/A, 11/13/2016); PULMONARY VENOGRAPHY (N/A, 11/19/2016); Colonoscopy with propofol (N/A, 02/03/2017); Colon surgery (11/02/2016); IVC FILTER INSERTION (Right, 10/2016); Colostomy reversal (N/A, 02/24/2017); Appendectomy (N/A, 02/24/2017); Lysis of adhesion (N/A, 02/24/2017); laparoscopy (N/A, 02/24/2017); Ileo loop diversion (N/A, 02/24/2017); Sigmoidoscopy (N/A, 05/19/2017); Ileostomy closure (N/A, 06/01/2017); IVC FILTER REMOVAL (N/A, 08/09/2017); Sacroplasty (N/A, 11/08/2017); Cataract extraction w/PHACO (Left, 11/21/2018); Cataract extraction w/PHACO  (Right, 12/12/2018); and Video bronchoscopy with endobronchial navigation (N/A, 02/23/2021). Ms. Shartzer has a current medication list which includes the following prescription(s): alendronate, calcium carbonate, candesartan, cholecalciferol, vitamin b-12, cyclobenzaprine, dulaglutide, gabapentin, gabapentin, glipizide, glipizide, hydrocodone-acetaminophen, [START ON 07/14/2021]  hydrocodone-acetaminophen, hydrocortisone, ibuprofen, meclizine, multivitamin with minerals, ropinirole, tolterodine, tramadol, warfarin, and warfarin, and the following Facility-Administered Medications: fentanyl. Her primarily concern today is the Back Pain  Initial Vital Signs:  Pulse/HCG Rate: 95ECG Heart Rate: 93 Temp: (!) 97.2 F (36.2 C) Resp: 18 BP: 110/75 SpO2: 97 %  BMI: Estimated body mass index is 35.96 kg/m as calculated from the following:   Height as of this encounter: 5\' 3"  (1.6 m).   Weight as of this encounter: 203 lb (92.1 kg).  Risk Assessment: Allergies: Reviewed. She is allergic to adhesive [tape], morphine, other, latex, and morphine and related.  Allergy Precautions: None required Coagulopathies: Reviewed. None identified.  Blood-thinner therapy: None at this time Active Infection(s): Reviewed. None identified. Ms. Olheiser is afebrile  Site Confirmation: Ms. Ganger was asked to confirm the procedure and laterality before marking the site Procedure checklist: Completed Consent: Before the procedure and under the influence of no sedative(s), amnesic(s), or anxiolytics, the patient was informed of the treatment options, risks and possible complications. To fulfill our ethical and legal obligations, as recommended by the American Medical Association's Code of Ethics, I have informed the patient of my clinical impression; the nature and purpose of the treatment or procedure; the risks, benefits, and possible complications of the intervention; the alternatives, including doing nothing; the risk(s)  and benefit(s) of the alternative treatment(s) or procedure(s); and the risk(s) and benefit(s) of doing nothing. The patient was provided information about the general risks and possible complications associated with the procedure. These may include, but are not limited to: failure to achieve desired goals, infection, bleeding, organ or nerve damage, allergic reactions, paralysis, and death. In addition, the patient was informed of those risks and complications associated to Spine-related procedures, such as failure to decrease pain; infection (i.e.: Meningitis, epidural or intraspinal abscess); bleeding (i.e.: epidural hematoma, subarachnoid hemorrhage, or any other type of intraspinal or peri-dural bleeding); organ or nerve damage (i.e.: Any type of peripheral nerve, nerve root, or spinal cord injury) with subsequent damage to sensory, motor, and/or autonomic systems, resulting in permanent pain, numbness, and/or weakness of one or several areas of the body; allergic reactions; (i.e.: anaphylactic reaction); and/or death. Furthermore, the patient was informed of those risks and complications associated with the medications. These include, but are not limited to: allergic reactions (i.e.: anaphylactic or anaphylactoid reaction(s)); adrenal axis suppression; blood sugar elevation that in diabetics may result in ketoacidosis or comma; water retention that in patients with history of congestive heart failure may result in shortness of breath, pulmonary edema, and decompensation with resultant heart failure; weight gain; swelling or edema; medication-induced neural toxicity; particulate matter embolism and blood vessel occlusion with resultant organ, and/or nervous system infarction; and/or aseptic necrosis of one or more joints. Finally, the patient was informed that Medicine is not an exact science; therefore, there is also the possibility of unforeseen or unpredictable risks and/or possible complications that  may result in a catastrophic outcome. The patient indicated having understood very clearly. We have given the patient no guarantees and we have made no promises. Enough time was given to the patient to ask questions, all of which were answered to the patient's satisfaction. Ms. Shiller has indicated that she wanted to continue with the procedure. Attestation: I, the ordering provider, attest that I have discussed with the patient the benefits, risks, side-effects, alternatives, likelihood of achieving goals, and potential problems during recovery for the procedure that I have provided informed consent. Date   Time: 07/07/2021  8:27  AM  Pre-Procedure Preparation:  Monitoring: As per clinic protocol. Respiration, ETCO2, SpO2, BP, heart rate and rhythm monitor placed and checked for adequate function Safety Precautions: Patient was assessed for positional comfort and pressure points before starting the procedure. Time-out: I initiated and conducted the "Time-out" before starting the procedure, as per protocol. The patient was asked to participate by confirming the accuracy of the "Time Out" information. Verification of the correct person, site, and procedure were performed and confirmed by me, the nursing staff, and the patient. "Time-out" conducted as per Joint Commission's Universal Protocol (UP.01.01.01). Time: 0922  Description of Procedure:          Laterality: Right Levels:  L2, L3, L4, L5, & S1 Medial Branch Level(s), at the L3-4 and L5-S1 lumbar facet joints. Safety Precautions: Aspiration looking for blood return was conducted prior to all injections. At no point did we inject any substances, as a needle was being advanced. Before injecting, the patient was told to immediately notify me if she was experiencing any new onset of "ringing in the ears, or metallic taste in the mouth". No attempts were made at seeking any paresthesias. Safe injection practices and needle disposal techniques used.  Medications properly checked for expiration dates. SDV (single dose vial) medications used. After the completion of the procedure, all disposable equipment used was discarded in the proper designated medical waste containers. Local Anesthesia: Protocol guidelines were followed. The patient was positioned over the fluoroscopy table. The area was prepped in the usual manner. The time-out was completed. The target area was identified using fluoroscopy. A 12-in long, straight, sterile hemostat was used with fluoroscopic guidance to locate the targets for each level blocked. Once located, the skin was marked with an approved surgical skin marker. Once all sites were marked, the skin (epidermis, dermis, and hypodermis), as well as deeper tissues (fat, connective tissue and muscle) were infiltrated with a small amount of a short-acting local anesthetic, loaded on a 10cc syringe with a 25G, 1.5-in  Needle. An appropriate amount of time was allowed for local anesthetics to take effect before proceeding to the next step. Technical description of process:  Radiofrequency Ablation (RFA) L2 Medial Branch Nerve RFA: The target area for the L2 medial branch is at the junction of the postero-lateral aspect of the superior articular process and the superior, posterior, and medial edge of the transverse process of L3. Under fluoroscopic guidance, a Radiofrequency needle was inserted until contact was made with os over the superior postero-lateral aspect of the pedicular shadow (target area). Sensory and motor testing was conducted to properly adjust the position of the needle. Once satisfactory placement of the needle was achieved, the numbing solution was slowly injected after negative aspiration for blood. 2.0 mL of the nerve block solution was injected without difficulty or complication. After waiting for at least 3 minutes, the ablation was performed. Once completed, the needle was removed intact. L3 Medial Branch Nerve RFA:  The target area for the L3 medial branch is at the junction of the postero-lateral aspect of the superior articular process and the superior, posterior, and medial edge of the transverse process of L4. Under fluoroscopic guidance, a Radiofrequency needle was inserted until contact was made with os over the superior postero-lateral aspect of the pedicular shadow (target area). Sensory and motor testing was conducted to properly adjust the position of the needle. Once satisfactory placement of the needle was achieved, the numbing solution was slowly injected after negative aspiration for blood. 2.0 mL of  the nerve block solution was injected without difficulty or complication. After waiting for at least 3 minutes, the ablation was performed. Once completed, the needle was removed intact. L4 Medial Branch Nerve RFA: The target area for the L4 medial branch is at the junction of the postero-lateral aspect of the superior articular process and the superior, posterior, and medial edge of the transverse process of L5. Under fluoroscopic guidance, a Radiofrequency needle was inserted until contact was made with os over the superior postero-lateral aspect of the pedicular shadow (target area). Sensory and motor testing was conducted to properly adjust the position of the needle. Once satisfactory placement of the needle was achieved, the numbing solution was slowly injected after negative aspiration for blood. 2.0 mL of the nerve block solution was injected without difficulty or complication. After waiting for at least 3 minutes, the ablation was performed. Once completed, the needle was removed intact. L5 Medial Branch Nerve RFA: The target area for the L5 medial branch is at the junction of the postero-lateral aspect of the superior articular process of S1 and the superior, posterior, and medial edge of the sacral ala. Under fluoroscopic guidance, a Radiofrequency needle was inserted until contact was made with os over  the superior postero-lateral aspect of the pedicular shadow (target area). Sensory and motor testing was conducted to properly adjust the position of the needle. Once satisfactory placement of the needle was achieved, the numbing solution was slowly injected after negative aspiration for blood. 2.0 mL of the nerve block solution was injected without difficulty or complication. After waiting for at least 3 minutes, the ablation was performed. Once completed, the needle was removed intact. S1 Medial Branch Nerve RFA: The target area for the S1 medial branch is located inferior to the junction of the S1 superior articular process and the L5 inferior articular process, posterior, inferior, and lateral to the 6 o'clock position of the L5-S1 facet joint, just superior to the S1 posterior foramen. Under fluoroscopic guidance, the Radiofrequency needle was advanced until contact was made with os over the Target area. Sensory and motor testing was conducted to properly adjust the position of the needle. Once satisfactory placement of the needle was achieved, the numbing solution was slowly injected after negative aspiration for blood. 2.0 mL of the nerve block solution was injected without difficulty or complication. After waiting for at least 3 minutes, the ablation was performed. Once completed, the needle was removed intact. Radiofrequency lesioning (ablation):  Radiofrequency Generator: Medtronic AccurianTM AG 1000 RF Generator Sensory Stimulation Parameters: 50 Hz was used to locate & identify the nerve, making sure that the needle was positioned such that there was no sensory stimulation below 0.3 V or above 0.7 V. Motor Stimulation Parameters: 2 Hz was used to evaluate the motor component. Care was taken not to lesion any nerves that demonstrated motor stimulation of the lower extremities at an output of less than 2.5 times that of the sensory threshold, or a maximum of 2.0 V. Lesioning Technique Parameters:  Standard Radiofrequency settings. (Not bipolar or pulsed.) Temperature Settings: 80 degrees C Lesioning time: 60 seconds Intra-operative Compliance: Compliant  Once the entire procedure was completed, the treated area was cleaned, making sure to leave some of the prepping solution back to take advantage of its long term bactericidal properties.    Illustration of the posterior view of the lumbar spine and the posterior neural structures. Laminae of L2 through S1 are labeled. DPRL5, dorsal primary ramus of L5; DPRS1, dorsal primary ramus  of S1; DPR3, dorsal primary ramus of L3; FJ, facet (zygapophyseal) joint L3-L4; I, inferior articular process of L4; LB1, lateral branch of dorsal primary ramus of L1; IAB, inferior articular branches from L3 medial branch (supplies L4-L5 facet joint); IBP, intermediate branch plexus; MB3, medial branch of dorsal primary ramus of L3; NR3, third lumbar nerve root; S, superior articular process of L5; SAB, superior articular branches from L4 (supplies L4-5 facet joint also); TP3, transverse process of L3.  Vitals:   07/07/21 1002 07/07/21 1012 07/07/21 1022 07/07/21 1032  BP: 127/60 123/60 133/71 128/69  Pulse:      Resp: 15 16 16    Temp:  (!) 97.4 F (36.3 C)  (!) 97.4 F (36.3 C)  SpO2: 100% 100% 98% 96%  Weight:      Height:       Start Time: 0922 hrs. End Time: 1002 hrs.  Imaging Guidance (Spinal):          Type of Imaging Technique: Fluoroscopy Guidance (Spinal) Indication(s): Assistance in needle guidance and placement for procedures requiring needle placement in or near specific anatomical locations not easily accessible without such assistance. Exposure Time: Please see nurses notes. Contrast: None used. Fluoroscopic Guidance: I was personally present during the use of fluoroscopy. "Tunnel Vision Technique" used to obtain the best possible view of the target area. Parallax error corrected before commencing the procedure. "Direction-depth-direction"  technique used to introduce the needle under continuous pulsed fluoroscopy. Once target was reached, antero-posterior, oblique, and lateral fluoroscopic projection used confirm needle placement in all planes. Images permanently stored in EMR. Interpretation: No contrast injected. I personally interpreted the imaging intraoperatively. Adequate needle placement confirmed in multiple planes. Permanent images saved into the patient's record.  Antibiotic Prophylaxis:   Anti-infectives (From admission, onward)    None      Indication(s): None identified  Post-operative Assessment:  Post-procedure Vital Signs:  Pulse/HCG Rate: 9586 Temp: (!) 97.4 F (36.3 C) Resp: 16 BP: 128/69 SpO2: 96 %  EBL: None  Complications: No immediate post-treatment complications observed by team, or reported by patient.  Note: The patient tolerated the entire procedure well. A repeat set of vitals were taken after the procedure and the patient was kept under observation following institutional policy, for this type of procedure. Post-procedural neurological assessment was performed, showing return to baseline, prior to discharge. The patient was provided with post-procedure discharge instructions, including a section on how to identify potential problems. Should any problems arise concerning this procedure, the patient was given instructions to immediately contact us, at any time, without hesitation. In any case, we plan to contact the patient by telephone for a follow-up status report regarding this interventional procedure.  Comments:  No additional relevant information.  Plan of Care  Orders:  Orders Placed This Encounter  Procedures   Radiofrequency,Lumbar    Scheduling Instructions:     Side(s): Right-sided     Level: L3-4, L4-5, & L5-S1 Facets (L2, L3, L4, L5, & S1 Medial Branch Nerves)     Sedation: With Sedation.     Timeframe: Today    Order Specific Question:   Where will this procedure be  performed?    Answer:   [name]   Radiofrequency,Lumbar    Standing Status:   Future    Standing Expiration Date:   10/04/2021    Scheduling Instructions:     Side(s): Left-sided     Level: L3-4, L4-5, & L5-S1 Facets (L2, L3, L4, L5, & S1 Medial Branch Nerves)  Sedation: With Sedation.     Scheduling Timeframe: 2 weeks from now    Order Specific Question:   Where will this procedure be performed?    Answer:   [name]   DG PAIN CLINIC C-ARM 1-60 MIN NO REPORT    Intraoperative interpretation by procedural physician at [place].    Standing Status:   Standing    Number of Occurrences:   1    Order Specific Question:   Reason for exam:    Answer:   Assistance in needle guidance and placement for procedures requiring needle placement in or near specific anatomical locations not easily accessible without such assistance.   Informed Consent Details: Physician/Practitioner Attestation; Transcribe to consent form and obtain patient signature    Nursing Order: Transcribe to consent form and obtain patient signature. Note: Always confirm laterality of pain with Ms. Levey, before procedure.    Order Specific Question:   Physician/Practitioner attestation of informed consent for procedure/surgical case    Answer:   I, the physician/practitioner, attest that I have discussed with the patient the benefits, risks, side effects, alternatives, likelihood of achieving goals and potential problems during recovery for the procedure that I have provided informed consent.    Order Specific Question:   Procedure    Answer:   Lumbar Facet Radiofrequency Ablation    Order Specific Question:   Physician/Practitioner performing the procedure    Answer:   Orien Mayhall A. Dossie Arbour, MD    Order Specific Question:   Indication/Reason    Answer:   Low Back Pain, with our without leg pain, due to Facet Joint Arthralgia (Joint Pain) known as Lumbar Facet Syndrome, secondary to  Lumbar, and/or Lumbosacral Spondylosis (Arthritis of the Spine), without myelopathy or radiculopathy (Nerve Damage).   Care order/instruction: Please confirm that the patient has stopped the Coumadin (Warfarin) X 5 days prior to procedure or surgery.    Please confirm that the patient has stopped the Coumadin (Warfarin) X 5 days prior to procedure or surgery.    Standing Status:   Standing    Number of Occurrences:   1   Provide equipment / supplies at bedside    "Radiofrequency Tray"; Large hemostat (x1); Small hemostat (x1); Towels (x8); 4x4 sterile sponge pack (x1) Needle type: Teflon-coated Radiofrequency Needle (Disposable   single use) Size: Regular Quantity: 5    Standing Status:   Standing    Number of Occurrences:   1    Order Specific Question:   Specify    Answer:   Radiofrequency Tray   Bleeding precautions    Standing Status:   Standing    Number of Occurrences:   1   Latex precautions    Activate Latex-Free Protocol.    Standing Status:   Standing    Number of Occurrences:   1   Chronic Opioid Analgesic:  Oxycodone IR 10 mg, 1 tab PO qd,  from PCP. No chronic opioid analgesics therapy prescribed by our practice, 2ry to Medication Agreement Violation. (Getting oxycodone from PCP in multiple occasions, according to PMP)(11/21/20; 12/26/20; 01/16/21; 02/13/21). Opioid analgesic pharmacotherapy D/C'ed on 03/03/2021. MME: 20 mg/day.   Medications ordered for procedure: Meds ordered this encounter  Medications   HYDROcodone-acetaminophen (NORCO/VICODIN) 5-325 MG tablet    Sig: Take 1 tablet by mouth every 8 (eight) hours as needed for up to 7 days for severe pain. Must last 7 days.    Dispense:  21 tablet    Refill:  0  For acute post-operative pain. Not to be refilled. Must last 7 days.   HYDROcodone-acetaminophen (NORCO/VICODIN) 5-325 MG tablet    Sig: Take 1 tablet by mouth every 8 (eight) hours as needed for up to 7 days for severe pain. Must last for 7 days.    Dispense:   21 tablet    Refill:  0    For acute post-operative pain. Not to be refilled. Must last 7 days.   lidocaine (XYLOCAINE) 2 % (with pres) injection 400 mg   lactated ringers infusion 1,000 mL   midazolam (VERSED) 5 MG/5ML injection 0.5-2 mg    Make sure Flumazenil is available in the pyxis when using this medication. If oversedation occurs, administer 0.2 mg IV over 15 sec. If after 45 sec no response, administer 0.2 mg again over 1 min; may repeat at 1 min intervals; not to exceed 4 doses (1 mg)   fentaNYL (SUBLIMAZE) injection 25-50 mcg    Make sure Narcan is available in the pyxis when using this medication. In the event of respiratory depression (RR< 8/min): Titrate NARCAN (naloxone) in increments of 0.1 to 0.2 mg IV at 2-3 minute intervals, until desired degree of reversal.   triamcinolone acetonide (KENALOG-40) injection 40 mg   ropivacaine (PF) 2 mg/mL (0.2%) (NAROPIN) injection 9 mL   Medications administered: We administered lidocaine, lactated ringers, midazolam, fentaNYL, triamcinolone acetonide, and ropivacaine (PF) 2 mg/mL (0.2%).  See the medical record for exact dosing, route, and time of administration.  Follow-up plan:   Return in about 2 weeks (around 07/21/2021) for (44min), (ECT) procedure: (L) L-FCT RFA #1, (PPE).       Interventional Therapies  Risk   Complexity Considerations:   Estimated body mass index is 37.45 kg/m as calculated from the following:   Height as of 05/05/21: 5\' 3"  (1.6 m).   Weight as of 05/13/21: 211 lb 6.4 oz (95.9 kg). 1.  Coumadin anticoagulation  (Stop: 5 days   Re-start: 2hrs) 2.  Latex allergy  3.  Morbid obesity (class III) (BMI>40)  4.  No Lumbar RFA due to morbid obesity    Planned   Pending:   Therapeutic bilateral lumbar facet RFA #1 (starting with the right side)   Under consideration:   Therapeutic bilateral lumbar facet RFA #1 (starting with the right side)   Completed:   Palliative/Therapeutic left L4-5 LESI x1 (11/10/2017)  (100/80/0/0-25)  Palliative left lumbar facet MBB x17 (05/05/2021) (100/100/75/85)  Palliative right lumbar facet MBB x23 (05/05/2021) (100/100/75/85)  Palliative right SI joint block x8 (not effective) (03/08/2019) (100/100/100/0)  Diagnostic left SI joint block x1 (not effective) (03/08/2019) (100/100/100/0)  Palliative/Therapeutic (Midline) caudal ESI x3 (06/08/2018) (100/100/100 x 2-3 days/0)    Therapeutic   Palliative (PRN) options:   Palliative lumbar facet MBB      Recent Visits Date Type Provider Dept  05/27/21 Office Visit Milinda Pointer, MD [name]  05/20/21 Office Visit Milinda Pointer, MD [name]  05/05/21 Procedure visit Milinda Pointer, MD [name]  04/20/21 Office Visit Milinda Pointer, MD [name]  Showing recent visits within past 90 days and meeting all other requirements Today's Visits Date Type Provider Dept  07/07/21 Procedure visit Milinda Pointer, MD [name]  Showing today's visits and meeting all other requirements Future Appointments Date Type Provider Dept  07/21/21 Appointment Milinda Pointer, MD [name]  Showing future appointments within next 90 days and meeting all other requirements  Disposition: Discharge home  Discharge (Date  Time): 07/07/2021; 1032 hrs.   Primary Care Physician: Tracie Harrier, MD Location: [facility] Note by: Gaspar Cola, MD Date: 07/07/2021; Time: 11:37 AM  Disclaimer:  Medicine is not an exact science. The only guarantee in medicine is that nothing is guaranteed. It is important to note that the decision to proceed with this intervention was based on the information collected from the patient. The Data and conclusions were drawn from the patient's questionnaire, the interview, and the physical examination. Because the information was provided in large part by the patient, it cannot be  guaranteed that it has not been purposely or unconsciously manipulated. Every effort has been made to obtain as much relevant data as possible for this evaluation. It is important to note that the conclusions that lead to this procedure are derived in large part from the available data. Always take into account that the treatment will also be dependent on availability of resources and existing treatment guidelines, considered by other Pain Management Practitioners as being common knowledge and practice, at the time of the intervention. For Medico-Legal purposes, it is also important to point out that variation in procedural techniques and pharmacological choices are the acceptable norm. The indications, contraindications, technique, and results of the above procedure should only be interpreted and judged by a Board-Certified Interventional Pain Specialist with extensive familiarity and expertise in the same exact procedure and technique.

## 2021-07-07 NOTE — Telephone Encounter (Signed)
Returned patient's call.  She was not understanding the qty on her prost procedure pain medicine.  It is for 7 days every 8 prn severe pain qty 21.  When I did the math then patient understood directions.  Verbalizes u/o information.

## 2021-07-07 NOTE — Patient Instructions (Addendum)
___________________________________________________________________________________________  Post-Radiofrequency (RF) Discharge Instructions  You have just completed a Radiofrequency Neurotomy.  The following instructions will provide you with information and guidelines for self-care upon discharge.  If at any time you have questions or concerns please call your physician. DO NOT DRIVE YOURSELF!!  Instructions: Apply ice: Fill a plastic sandwich bag with crushed ice. Cover it with a small towel and apply to injection site. Apply for 15 minutes then remove x 15 minutes. Repeat sequence on day of procedure, until you go to bed. The purpose is to minimize swelling and discomfort after procedure. Apply heat: Apply heat to procedure site starting the day following the procedure. The purpose is to treat any soreness and discomfort from the procedure. Food intake: No eating limitations, unless stipulated above.  Nevertheless, if you have had sedation, you may experience some nausea.  In this case, it may be wise to wait at least two hours prior to resuming regular diet. Physical activities: Keep activities to a minimum for the first 8 hours after the procedure. For the first 24 hours after the procedure, do not drive a motor vehicle,  Operate heavy machinery, power tools, or handle any weapons.  Consider walking with the use of an assistive device or accompanied by an adult for the first 24 hours.  Do not drink alcoholic beverages including beer.  Do not make any important decisions or sign any legal documents. Go home and rest today.  Resume activities tomorrow, as tolerated.  Use caution in moving about as you may experience mild leg weakness.  Use caution in cooking, use of household electrical appliances and climbing steps. Driving: If you have received any sedation, you are not allowed to drive for 24 hours after your procedure. Blood thinner: Restart your blood thinner 6 hours after your procedure. (Only  for those taking blood thinners) Insulin: As soon as you can eat, you may resume your normal dosing schedule. (Only for those taking insulin) Medications: May resume pre-procedure medications.  Do not take any drugs, other than what has been prescribed to you. Infection prevention: Keep procedure site clean and dry. Post-procedure Pain Diary: Extremely important that this be done correctly and accurately. Recorded information will be used to determine the next step in treatment. Pain evaluated is that of treated area only. Do not include pain from an untreated area. Complete every hour, on the hour, for the initial 8 hours. Set an alarm to help you do this part accurately. Do not go to sleep and have it completed later. It will not be accurate. Follow-up appointment: Keep your follow-up appointment after the procedure. Usually 2-6 weeks after radiofrequency. Bring you pain diary. The information collected will be essential for your long-term care.   Expect: From numbing medicine (AKA: Local Anesthetics): Numbness or decrease in pain. Onset: Full effect within 15 minutes of injected. Duration: It will depend on the type of local anesthetic used. On the average, 1 to 8 hours.  From steroids (when added): Decrease in swelling or inflammation. Once inflammation is improved, relief of the pain will follow. Onset of benefits: Depends on the amount of swelling present. The more swelling, the longer it will take for the benefits to be seen. In some cases, up to 10 days. Duration: Steroids will stay in the system x 2 weeks. Duration of benefits will depend on multiple posibilities including persistent irritating factors. From procedure: Some discomfort is to be expected once the numbing medicine wears off. In the case of radiofrequency procedures,  this may last as long as 6 weeks. Additional post-procedure pain medication is provided for this. Discomfort is minimized if ice and heat are applied as  instructed.  Call if: You experience numbness and weakness that gets worse with time, as opposed to wearing off. He experience any unusual bleeding, difficulty breathing, or loss of the ability to control your bowel and bladder. (This applies to Spinal procedures only) You experience any redness, swelling, heat, red streaks, elevated temperature, fever, or any other signs of a possible infection.  Emergency Numbers: [facility] hours (Monday - Thursday, 8:00 AM - 4:00 PM) (Friday, 9:00 AM - 12:00 Noon): [phone] After hours: [phone] ____________________________________________________________________________________________  ____________________________________________________________________________________________  Blood Thinners  IMPORTANT NOTICE:  If you take any of these, make sure to notify the nursing staff.  Failure to do so may result in injury.  Recommended time intervals to stop and restart blood-thinners, before & after invasive procedures  Generic Name Brand Name Pre-procedure. Stop this long before procedure. Post-procedure. Minimum waiting period before restarting.  Abciximab Reopro 15 days 2 hrs  Alteplase Activase 10 days 10 days  Anagrelide Agrylin    Apixaban Eliquis 3 days 6 hrs  Cilostazol Pletal 3 days 5 hrs  Clopidogrel Plavix 7-10 days 2 hrs  Dabigatran Pradaxa 5 days 6 hrs  Dalteparin Fragmin 24 hours 4 hrs  Dipyridamole Aggrenox 11days 2 hrs  Edoxaban Lixiana; Savaysa 3 days 2 hrs  Enoxaparin  Lovenox 24 hours 4 hrs  Eptifibatide Integrillin 8 hours 2 hrs  Fondaparinux  Arixtra 72 hours 12 hrs  Hydroxychloroquine Plaquenil 11 days   Prasugrel Effient 7-10 days 6 hrs  Reteplase Retavase 10 days 10 days  Rivaroxaban Xarelto 3 days 6 hrs  Ticagrelor Brilinta 5-7 days 6 hrs  Ticlopidine Ticlid 10-14 days 2 hrs  Tinzaparin Innohep 24 hours 4 hrs  Tirofiban Aggrastat 8 hours 2 hrs  Warfarin Coumadin 5 days 2 hrs   Other medications  with blood-thinning effects  Product indications Generic (Brand) names Note  Cholesterol Lipitor Stop 4 days before procedure  Blood thinner (injectable) Heparin (LMW or LMWH Heparin) Stop 24 hours before procedure  Cancer Ibrutinib (Imbruvica) Stop 7 days before procedure  Malaria/Rheumatoid Hydroxychloroquine (Plaquenil) Stop 11 days before procedure  Thrombolytics  10 days before or after procedures   Over-the-counter (OTC) Products with blood-thinning effects  Product Common names Stop Time  Aspirin > 325 mg Goody Powders, Excedrin, etc. 11 days  Aspirin ? 81 mg  7 days  Fish oil  4 days  Garlic supplements  7 days  Ginkgo biloba  36 hours  Ginseng  24 hours  NSAIDs Ibuprofen, Naprosyn, etc. 3 days  Vitamin E  4 days   ____________________________________________________________________________________________ Preparing for Procedure with Sedation Instructions: Oral Intake: Do not eat or drink anything for at least 8 hours prior to your procedure. Transportation: Public transportation is not allowed. Bring an adult driver. The driver must be physically present in our waiting room before any procedure can be started. Physical Assistance: Bring an adult capable of physically assisting you, in the event you need help. Blood Pressure Medicine: Take your blood pressure medicine with a sip of water the morning of the procedure. Insulin: Take only  of your normal insulin dose. Preventing infections: Shower with an antibacterial soap the morning of your procedure. Build-up your immune system: Take 1000 mg of Vitamin C with every meal (3 times a day) the day prior to your procedure. Pregnancy: If you are pregnant, call  and cancel the procedure. Sickness: If you have a cold, fever, or any active infections, call and cancel the procedure. Arrival: You must be in the facility at least 30 minutes prior to your scheduled procedure. Children: Do not bring children with you. Dress appropriately:  Bring dark clothing that you would not mind if they get stained. Valuables: Do not bring any jewelry or valuables. Procedure appointments are reserved for interventional treatments only. No Prescription Refills. No medication changes will be discussed during procedure appointments. No disability issues will be discussed. Post-procedure Information What to expect: Most procedures involve the use of a local anesthetic (numbing medicine), and a steroid (anti-inflammatory medicine).  The local anesthetics may cause temporary numbness and weakness of the legs or arms, depending on the location of the block. This numbness/weakness may last 4-6 hours, depending on the local anesthetic used. In rare instances, it can last up to 24 hours. While numb, you must be very careful not to injure the extremity.  After any procedure, you could expect the pain to get better within 15-20 minutes. This relief is temporary and may last 4-6 hours. Once the local anesthetics wears off, you could experience discomfort, possibly more than usual, for up to 10 (ten) days. In the case of radiofrequencies, it may last up to 6 weeks. Surgeries may take up to 8 weeks for the healing process. The discomfort is due to the irritation caused by needles going through skin and muscle. To minimize the discomfort, we recommend using ice the first day, and heat from then on. The ice should be applied for 15 minutes on, and 15 minutes off. Keep repeating this cycle until bedtime. Avoid applying the ice directly to the skin, to prevent frostbite. Heat should be used daily, until the pain improves (4-10 days). Be careful not to burn yourself.  Occasionally you may experience muscle spasms or cramps. These occur as a consequence of the irritation caused by the needle sticks to the muscle and the blood that will inevitably be lost into the surrounding muscle tissue. Blood tends to be very irritating to tissues, which tend to react by going into  spasm. These spasms may start the same day of your procedure, but they may also take days to develop. This late onset type of spasm or cramp is usually caused by electrolyte imbalances triggered by the steroids, at the level of the kidney. Cramps and spasms tend to respond well to muscle relaxants, multivitamins (some are triggered by the procedure, but may have their origins in vitamin deficiencies), and Gatorade, or any sports drinks that can replenish any electrolyte imbalances. (If you are a diabetic, ask your pharmacist to get you a sugar-free brand.) Warm showers or baths may also be helpful. Stretching exercises are highly recommended. General Instructions:  Be alert for signs of possible infection: redness, swelling, heat, red streaks, elevated temperature, and/or fever. These typically appear 4 to 6 days after the procedure. Immediately notify your doctor if you experience unusual bleeding, difficulty breathing, or loss of bowel or bladder control. If you experience increased pain, do not increase your pain medicine intake, unless instructed by your pain physician. Post-Procedure Care:  Be careful in moving about. Muscle spasms in the area of the injection may occur. Applying ice or heat to the area is often helpful. The incidence of spinal headaches after epidural injections ranges between 1.4% and 6%. If you develop a headache that does not seem to respond to conservative therapy, please let your physician know.  This can be treated with an epidural blood patch.   Post-procedure numbness or redness is to be expected, however it should average 4 to 6 hours. If numbness and weakness of your extremities begins to develop 4 to 6 hours after your procedure, and is felt to be progressing and worsening, immediately contact your physician.   Diet:  If you experience nausea, do not eat until this sensation goes away. If you had a Stellate Ganglion Block for upper extremity Reflex Sympathetic  Dystrophy, do not eat or drink until your hoarseness goes away. In any case, always start with liquids first and if you tolerate them well, then slowly progress to more solid foods. Activity:  For the first 4 to 6 hours after the procedure, use caution in moving about as you may experience numbness and/or weakness. Use caution in cooking, using household electrical appliances, and climbing steps. If you need to reach your Doctor call our office: [phone] Monday-Thursday 8:00 am - 4:00 PM    Fridays: Closed     In case of an emergency: In case of emergency, call 911 or go to the nearest emergency room and have the physician there call us.  Interpretation of Procedure Every nerve block has two components: a diagnostic component, and a treatment component. Unrealistic expectations are the most common causes of perceived failure.  In a perfect world, a single nerve block should be able to completely and permanently eliminate the pain. Sadly, the world is not perfect.  Most pain management nerve blocks are performed using local anesthetics and steroids. Steroids are responsible for any long-term benefit that you may experience. Their purpose is to decrease any chronic swelling that may exist in the area. Steroids begin to work immediately after being injected. However, most patients will not experience any benefits until 5 to 10 days after the injection, when the swelling has come down to the point where they can tell a difference. Steroids will only help if there is swelling to be treated. As such, they can assist with the diagnosis. If effective, they suggest an inflammatory component to the pain, and if ineffective, they rule out inflammation as the main cause or component of the problem. If the problem is one of mechanical compression, you will get no benefit from those steroids.   In the case of local anesthetics, they have a crucial role in the diagnosis of your condition. Most will begin to  work within15 to 20 minutes after injection. The duration will depend on the type used (short- vs. Long-acting). It is of outmost importance that patients keep tract of their pain, after the procedure. To assist with this matter, a Post-procedure Pain Diary is provided. Make sure to complete it and to bring it back to your follow-up appointment.  As long as the patient keeps accurate, detailed records of their symptoms after every procedure, and returns to have those interpreted, every procedure will provide [place] with invaluable information. Even a block that does not provide the patient with any relief, will always provide [place] with information about the mechanism and the origin of the pain. The only time a nerve block can be considered a waste of time is when patients do not keep track of the results, or do not keep their post-procedure appointment.  Reporting the results back to your physician The Pain Score  Pain is a subjective complaint. It cannot be seen, touched, or measured. We depend entirely on the patients report of the pain in order to  assess your condition and treatment. To evaluate the pain, we use a pain scale, where 0 means No Pain, and a 10 is the worst possible pain that you can even imagine (i.e. something like been eaten alive by a shark or being torn apart by a lion).   You will frequently be asked to rate your pain. Please be as accurate, remember that medical decisions will be based on your responses. Please do not rate your pain above a 10. Doing so is actually interpreted as symptom magnification (exaggeration), as well as lack of understanding with regards to the scale. To put this into perspective, when you tell us that your pain is at a 10 (ten), what you are saying is that there is nothing we can do to make this pain any worse. (Carefully think about that.) Stop Coumadin for 5 days prior to  procedure.

## 2021-07-08 ENCOUNTER — Telehealth: Payer: Self-pay | Admitting: *Deleted

## 2021-07-08 ENCOUNTER — Ambulatory Visit: Payer: PPO

## 2021-07-08 NOTE — Telephone Encounter (Signed)
Patient needs to reschedule upcoming appointment. She was unable to get her scan secondary to illness and had to reschedule the scan.

## 2021-07-15 ENCOUNTER — Ambulatory Visit: Payer: PPO | Admitting: Oncology

## 2021-07-16 ENCOUNTER — Ambulatory Visit: Payer: PPO

## 2021-07-16 DIAGNOSIS — M5136 Other intervertebral disc degeneration, lumbar region: Secondary | ICD-10-CM | POA: Diagnosis not present

## 2021-07-16 DIAGNOSIS — E1165 Type 2 diabetes mellitus with hyperglycemia: Secondary | ICD-10-CM | POA: Diagnosis not present

## 2021-07-16 DIAGNOSIS — I10 Essential (primary) hypertension: Secondary | ICD-10-CM | POA: Diagnosis not present

## 2021-07-16 DIAGNOSIS — I700 Atherosclerosis of aorta: Secondary | ICD-10-CM | POA: Diagnosis not present

## 2021-07-16 DIAGNOSIS — Z7901 Long term (current) use of anticoagulants: Secondary | ICD-10-CM | POA: Diagnosis not present

## 2021-07-16 DIAGNOSIS — C3411 Malignant neoplasm of upper lobe, right bronchus or lung: Secondary | ICD-10-CM | POA: Diagnosis not present

## 2021-07-17 ENCOUNTER — Telehealth: Payer: Self-pay | Admitting: Oncology

## 2021-07-17 NOTE — Telephone Encounter (Signed)
Pt called back, she has been made aware of her appts and is in agreement.

## 2021-07-17 NOTE — Telephone Encounter (Signed)
Left VM with patient with direct ext for her to call back. She rescheduled her CT scan due to illness and we have reschedule her MD f/u as Dr. Janese Banks wanted her to have that done prior to seeing her. Will also send an appointment reminder in the mail.

## 2021-07-19 DIAGNOSIS — R051 Acute cough: Secondary | ICD-10-CM | POA: Diagnosis not present

## 2021-07-19 DIAGNOSIS — R5383 Other fatigue: Secondary | ICD-10-CM | POA: Diagnosis not present

## 2021-07-19 DIAGNOSIS — U071 COVID-19: Secondary | ICD-10-CM | POA: Diagnosis not present

## 2021-07-21 ENCOUNTER — Encounter: Payer: Self-pay | Admitting: Pain Medicine

## 2021-07-21 ENCOUNTER — Ambulatory Visit
Admission: RE | Admit: 2021-07-21 | Discharge: 2021-07-21 | Disposition: A | Discharge status: Home / Self Care | Payer: PPO | Source: Ambulatory Visit | Attending: Pain Medicine | Admitting: Pain Medicine

## 2021-07-21 ENCOUNTER — Other Ambulatory Visit: Payer: Self-pay

## 2021-07-21 ENCOUNTER — Ambulatory Visit (HOSPITAL_BASED_OUTPATIENT_CLINIC_OR_DEPARTMENT_OTHER): Payer: PPO | Admitting: Pain Medicine

## 2021-07-21 ENCOUNTER — Inpatient Hospital Stay: Payer: PPO | Admitting: Oncology

## 2021-07-21 VITALS — BP 116/71 | HR 87 | Temp 97.7°F | Resp 16 | Ht 63.0 in | Wt 201.0 lb

## 2021-07-21 DIAGNOSIS — M47817 Spondylosis without myelopathy or radiculopathy, lumbosacral region: Secondary | ICD-10-CM

## 2021-07-21 DIAGNOSIS — M4310 Spondylolisthesis, site unspecified: Secondary | ICD-10-CM

## 2021-07-21 DIAGNOSIS — M47816 Spondylosis without myelopathy or radiculopathy, lumbar region: Secondary | ICD-10-CM | POA: Insufficient documentation

## 2021-07-21 DIAGNOSIS — M545 Low back pain, unspecified: Secondary | ICD-10-CM

## 2021-07-21 DIAGNOSIS — Z91040 Latex allergy status: Secondary | ICD-10-CM | POA: Insufficient documentation

## 2021-07-21 DIAGNOSIS — M4306 Spondylolysis, lumbar region: Secondary | ICD-10-CM | POA: Diagnosis not present

## 2021-07-21 DIAGNOSIS — G8929 Other chronic pain: Secondary | ICD-10-CM | POA: Insufficient documentation

## 2021-07-21 DIAGNOSIS — M5137 Other intervertebral disc degeneration, lumbosacral region: Secondary | ICD-10-CM

## 2021-07-21 DIAGNOSIS — G8918 Other acute postprocedural pain: Secondary | ICD-10-CM | POA: Insufficient documentation

## 2021-07-21 DIAGNOSIS — Z5189 Encounter for other specified aftercare: Secondary | ICD-10-CM | POA: Diagnosis not present

## 2021-07-21 DIAGNOSIS — Z7901 Long term (current) use of anticoagulants: Secondary | ICD-10-CM | POA: Diagnosis not present

## 2021-07-21 MED ORDER — MIDAZOLAM HCL 5 MG/5ML IJ SOLN
INTRAMUSCULAR | Status: AC
  Filled 2021-07-21: qty 5

## 2021-07-21 MED ORDER — TRIAMCINOLONE ACETONIDE 40 MG/ML IJ SUSP
40.0000 mg | Freq: Once | INTRAMUSCULAR | Status: AC
  Administered 2021-07-21: 40 mg

## 2021-07-21 MED ORDER — LIDOCAINE HCL 2 % IJ SOLN
20.0000 mL | Freq: Once | INTRAMUSCULAR | Status: AC
  Administered 2021-07-21: 400 mg

## 2021-07-21 MED ORDER — FENTANYL CITRATE (PF) 100 MCG/2ML IJ SOLN
25.0000 ug | INTRAMUSCULAR | Status: DC | PRN
  Administered 2021-07-21: 50 ug via INTRAVENOUS

## 2021-07-21 MED ORDER — TRIAMCINOLONE ACETONIDE 40 MG/ML IJ SUSP
INTRAMUSCULAR | Status: AC
  Filled 2021-07-21: qty 1

## 2021-07-21 MED ORDER — MIDAZOLAM HCL 5 MG/5ML IJ SOLN
0.5000 mg | Freq: Once | INTRAMUSCULAR | Status: AC
  Administered 2021-07-21: 2.5 mg via INTRAVENOUS

## 2021-07-21 MED ORDER — ROPIVACAINE HCL 2 MG/ML IJ SOLN
9.0000 mL | Freq: Once | INTRAMUSCULAR | Status: AC
  Administered 2021-07-21: 9 mL via PERINEURAL

## 2021-07-21 MED ORDER — HYDROCODONE-ACETAMINOPHEN 5-325 MG PO TABS: 1.0000 | ORAL_TABLET | Freq: Three times a day (TID) | ORAL | 0 refills | Status: AC | PRN

## 2021-07-21 MED ORDER — FENTANYL CITRATE (PF) 100 MCG/2ML IJ SOLN
INTRAMUSCULAR | Status: AC
  Filled 2021-07-21: qty 2

## 2021-07-21 MED ORDER — LACTATED RINGERS IV SOLN
1000.0000 mL | Freq: Once | INTRAVENOUS | Status: AC
  Administered 2021-07-21: 1000 mL via INTRAVENOUS

## 2021-07-21 NOTE — Patient Instructions (Addendum)
____________________________________________________________________________________________  Virtual Visits   What is a "Virtual Visit"? It is a Metallurgist (medical visit) that takes place on real time (NOT TEXT or E-MAIL) over the telephone or computer device (desktop, laptop, tablet, smart phone, etc.). It allows for more location flexibility between the patient and the healthcare provider.  Who decides when these types of visits will be used? The physician.  Who is eligible for these types of visits? Only those patients that can be reliably reached over the telephone.  What do you mean by reliably? We do not have time to call everyone multiple times, therefore those that tend to screen calls and then call back later are not suitable candidates for this system. We understand how people are reluctant to pickup on "unknown" calls, therefore, we suggest adding our telephone numbers to your list of "CONTACT(s)". This way, you should be able to readily identify our calls when you receive one. All of our numbers are available below.   Who is not eligible? This option is not available for medication management encounters, specially for controlled substances. Patients on pain medications that fall under the category of controlled substances have to come in for "Face-to-Face" encounters. This is required for mandatory monitoring of these substances. You may be asked to provide a sample for an unannounced urine drug screening test (UDS), and we will need to count your pain pills. Not bringing your pills to be counted may result in no refill. Obviously, neither one of these can be done over the phone.  When will this type of visits be used? You can request a virtual visit whenever you are physically unable to attend a regular appointment. The decision will be made by the physician (or healthcare provider) on a case by case basis.   At what time will I be called? This is an  excellent question. The providers will try to call you whenever they have time available. Do not expect to be called at any specific time. The secretaries will assign you a time for your virtual visit appointment, but this is done simply to keep a list of those patients that need to be called, but not for the purpose of keeping a time schedule. Be advised that the call may come in anytime during the day, between the hours of 8:00 AM and 8::00 PM, depending on provider availability. We do understand that the system is not perfect. If you are unable to be available that day on a moments notice, then request an "in-person" appointment rather than a "virtual visit".  Can I request my medication visits to be "Virtual"? Yes you may request it, but the decision is entirely up to the healthcare provider. Control substances require specific monitoring that requires Face-to-Face encounters. The number of encounters  and the extent of the monitoring is determined on a case by case basis.  Add a new contact to your smart phone and label it "PAIN CLINIC" Under this contact add the following numbers: Main: [phone] (Official Contact Number) Nurses: [phone] (These are outgoing only calling systems. Do not call this number.) Dr. Dossie Arbour: [phone] or [phone] (Outgoing calls only. Do not call this number.)  ____________________________________________________________________________________________   ___________________________________________________________________________________________  Post-Radiofrequency (RF) Discharge Instructions  You have just completed a Radiofrequency Neurotomy.  The following instructions will provide you with information and guidelines for self-care upon discharge.  If at any time you have questions or concerns please call your physician. DO NOT DRIVE YOURSELF!!  Instructions:  Apply ice: Fill a plastic sandwich bag with crushed ice. Cover it with a small  towel and apply to injection site. Apply for 15 minutes then remove x 15 minutes. Repeat sequence on day of procedure, until you go to bed. The purpose is to minimize swelling and discomfort after procedure. Apply heat: Apply heat to procedure site starting the day following the procedure. The purpose is to treat any soreness and discomfort from the procedure. Food intake: No eating limitations, unless stipulated above.  Nevertheless, if you have had sedation, you may experience some nausea.  In this case, it may be wise to wait at least two hours prior to resuming regular diet. Physical activities: Keep activities to a minimum for the first 8 hours after the procedure. For the first 24 hours after the procedure, do not drive a motor vehicle,  Operate heavy machinery, power tools, or handle any weapons.  Consider walking with the use of an assistive device or accompanied by an adult for the first 24 hours.  Do not drink alcoholic beverages including beer.  Do not make any important decisions or sign any legal documents. Go home and rest today.  Resume activities tomorrow, as tolerated.  Use caution in moving about as you may experience mild leg weakness.  Use caution in cooking, use of household electrical appliances and climbing steps. Driving: If you have received any sedation, you are not allowed to drive for 24 hours after your procedure. Blood thinner: Restart your blood thinner 6 hours after your procedure. (Only for those taking blood thinners) Insulin: As soon as you can eat, you may resume your normal dosing schedule. (Only for those taking insulin) Medications: May resume pre-procedure medications.  Do not take any drugs, other than what has been prescribed to you. Infection prevention: Keep procedure site clean and dry. Post-procedure Pain Diary: Extremely important that this be done correctly and accurately. Recorded information will be used to determine the next step in treatment. Pain  evaluated is that of treated area only. Do not include pain from an untreated area. Complete every hour, on the hour, for the initial 8 hours. Set an alarm to help you do this part accurately. Do not go to sleep and have it completed later. It will not be accurate. Follow-up appointment: Keep your follow-up appointment after the procedure. Usually 2-6 weeks after radiofrequency. Bring you pain diary. The information collected will be essential for your long-term care.   Expect: From numbing medicine (AKA: Local Anesthetics): Numbness or decrease in pain. Onset: Full effect within 15 minutes of injected. Duration: It will depend on the type of local anesthetic used. On the average, 1 to 8 hours.  From steroids (when added): Decrease in swelling or inflammation. Once inflammation is improved, relief of the pain will follow. Onset of benefits: Depends on the amount of swelling present. The more swelling, the longer it will take for the benefits to be seen. In some cases, up to 10 days. Duration: Steroids will stay in the system x 2 weeks. Duration of benefits will depend on multiple posibilities including persistent irritating factors. From procedure: Some discomfort is to be expected once the numbing medicine wears off. In the case of radiofrequency procedures, this may last as long as 6 weeks. Additional post-procedure pain medication is provided for this. Discomfort is minimized if ice and heat are applied as instructed.  Call if: You experience numbness and weakness that gets worse with time, as opposed to wearing off. He experience any  unusual bleeding, difficulty breathing, or loss of the ability to control your bowel and bladder. (This applies to Spinal procedures only) You experience any redness, swelling, heat, red streaks, elevated temperature, fever, or any other signs of a possible infection.  Emergency Numbers: [place] hours (Monday - Thursday, 8:00 AM - 4:00 PM) (Friday, 9:00  AM - 12:00 Noon): [phone] After hours: [phone] ____________________________________________________________________________________________  ____________________________________________________________________________________________  Blood Thinners  IMPORTANT NOTICE:  If you take any of these, make sure to notify the nursing staff.  Failure to do so may result in injury.  Recommended time intervals to stop and restart blood-thinners, before & after invasive procedures  Generic Name Brand Name Pre-procedure. Stop this long before procedure. Post-procedure. Minimum waiting period before restarting.  Abciximab Reopro 15 days 2 hrs  Alteplase Activase 10 days 10 days  Anagrelide Agrylin    Apixaban Eliquis 3 days 6 hrs  Cilostazol Pletal 3 days 5 hrs  Clopidogrel Plavix 7-10 days 2 hrs  Dabigatran Pradaxa 5 days 6 hrs  Dalteparin Fragmin 24 hours 4 hrs  Dipyridamole Aggrenox 11days 2 hrs  Edoxaban Lixiana; Savaysa 3 days 2 hrs  Enoxaparin  Lovenox 24 hours 4 hrs  Eptifibatide Integrillin 8 hours 2 hrs  Fondaparinux  Arixtra 72 hours 12 hrs  Hydroxychloroquine Plaquenil 11 days   Prasugrel Effient 7-10 days 6 hrs  Reteplase Retavase 10 days 10 days  Rivaroxaban Xarelto 3 days 6 hrs  Ticagrelor Brilinta 5-7 days 6 hrs  Ticlopidine Ticlid 10-14 days 2 hrs  Tinzaparin Innohep 24 hours 4 hrs  Tirofiban Aggrastat 8 hours 2 hrs  Warfarin Coumadin 5 days 2 hrs   Other medications with blood-thinning effects  Product indications Generic (Brand) names Note  Cholesterol Lipitor Stop 4 days before procedure  Blood thinner (injectable) Heparin (LMW or LMWH Heparin) Stop 24 hours before procedure  Cancer Ibrutinib (Imbruvica) Stop 7 days before procedure  Malaria/Rheumatoid Hydroxychloroquine (Plaquenil) Stop 11 days before procedure  Thrombolytics  10 days before or after procedures   Over-the-counter (OTC) Products with blood-thinning effects  Product Common names Stop  Time  Aspirin > 325 mg Goody Powders, Excedrin, etc. 11 days  Aspirin ? 81 mg  7 days  Fish oil  4 days  Garlic supplements  7 days  Ginkgo biloba  36 hours  Ginseng  24 hours  NSAIDs Ibuprofen, Naprosyn, etc. 3 days  Vitamin E  4 days   ____________________________________________________________________________________________   ______________________________________________________________________________________________  Body mass index (BMI)  Body mass index (BMI) is a common tool for deciding whether a person has an appropriate body weight.  It measures a persons weight in relation to their height.   According to the [facility] of health (NIH): A BMI of less than 18.5 means that a person is underweight. A BMI of between 18.5 and 24.9 is ideal. A BMI of between 25 and 29.9 is overweight. A BMI over 30 indicates obesity.  Weight Management Required  URGENT: Your weight has been found to be adversely affecting your health.  Dear Ms. Willetts:  Your current Estimated body mass index is 35.61 kg/m as calculated from the following:   Height as of this encounter: 5' 3" (1.6 m).   Weight as of this encounter: 201 lb (91.2 kg).  Please use the table below to identify your weight category and associated incidence of chronic pain, secondary to your weight.  Body Mass Index (BMI) Classification BMI level (kg/m2) Category Associated incidence of chronic pain  <18  Underweight   18.5-24.9 Ideal body weight   25-29.9 Overweight  20%  30-34.9 Obese (Class I)  68%  35-39.9 Severe obesity (Class II)  136%  >40 Extreme obesity (Class III)  254%   In addition: You will be considered "Morbidly Obese", if your BMI is above 30 and you have one or more of the following conditions which are known to be caused and/or directly associated with obesity: 1.    Type 2 Diabetes (Which in turn can lead to cardiovascular diseases (CVD), stroke, peripheral vascular diseases (PVD),  retinopathy, nephropathy, and neuropathy) 2.    Cardiovascular Disease (High Blood Pressure; Congestive Heart Failure; High Cholesterol; Coronary Artery Disease; Angina; or History of Heart Attacks) 3.    Breathing problems (Asthma; obesity-hypoventilation syndrome; obstructive sleep apnea; chronic inflammatory airway disease; reactive airway disease; or shortness of breath) 4.    Chronic kidney disease 5.    Liver disease (nonalcoholic fatty liver disease) 6.    High blood pressure 7.    Acid reflux (gastroesophageal reflux disease; heartburn) 8.    Osteoarthritis (OA) (with any of the following: hip pain; knee pain; and/or low back pain) 9.    Low back pain (Lumbar Facet Syndrome; and/or Degenerative Disc Disease) 10.  Hip pain (Osteoarthritis of hip) (For every 1 lbs of added body weight, there is a 2 lbs increase in pressure inside of each hip articulation. 1:2 mechanical relationship) 11.  Knee pain (Osteoarthritis of knee) (For every 1 lbs of added body weight, there is a 4 lbs increase in pressure inside of each knee articulation. 1:4 mechanical relationship) (patients with a BMI>30 kg/m2 were 6.8 times more likely to develop knee OA than normal-weight individuals) 12.  Cancer: Epidemiological studies have shown that obesity is a risk factor for: post-menopausal breast cancer; cancers of the endometrium, colon and kidney cancer; malignant adenomas of the oesophagus. Obese subjects have an approximately 1.5-3.5-fold increased risk of developing these cancers compared with normal-weight subjects, and it has been estimated that between 15 and 45% of these cancers can be attributed to overweight. More recent studies suggest that obesity may also increase the risk of other types of cancer, including pancreatic, hepatic and gallbladder cancer. (Ref: Obesity and cancer. Pischon T, Nthlings U, [employer] H. Proc Nutr Soc. 2008 May;67(2):128-45. doi: 23.5573/U2025427062376283.) The International Agency for  Research on Cancer (IARC) has identified 13 cancers associated with overweight and obesity: meningioma, multiple myeloma, adenocarcinoma of the esophagus, and cancers of the thyroid, postmenopausal breast cancer, gallbladder, stomach, liver, pancreas, kidney, ovaries, uterus, colon and rectal (colorectal) cancers. 30 percent of all cancers diagnosed in women and 24 percent of those diagnosed in men are associated with overweight and obesity.  Recommendation: At this point it is urgent that you take a step back and concentrate in loosing weight. Dedicate 100% of your efforts on this task. Nothing else will improve your health more than bringing your weight down and your BMI to less than 30. If you are here, you probably have chronic pain. We know that most chronic pain patients have difficulty exercising secondary to their pain. For this reason, you must rely on proper nutrition and diet in order to lose the weight. If your BMI is above 40, you should seriously consider bariatric surgery. A realistic goal is to lose 10% of your body weight over a period of 12 months.  Be honest to yourself, if over time you have unsuccessfully tried to lose weight, then it is time for you to seek professional  help and to enter a medically supervised weight management program, and/or undergo bariatric surgery. Stop procrastinating.   Pain management considerations:  1.    Pharmacological Problems: Be advised that the use of opioid analgesics (oxycodone; hydrocodone; morphine; methadone; codeine; and all of their derivatives) have been associated with decreased metabolism and weight gain.  For this reason, should we see that you are unable to lose weight while taking these medications, it may become necessary for [place] to taper down and indefinitely discontinue them.  2.    Technical Problems: The incidence of successful interventional therapies decreases as the patient's BMI increases. It is much more difficult to accomplish a safe  and effective interventional therapy on a patient with a BMI above 35. 3.    Radiation Exposure Problems: The x-rays machine, used to accomplish injection therapies, will automatically increase their x-ray output in order to capture an appropriate bone image. This means that radiation exposure increases exponentially with the patient's BMI. (The higher the BMI, the higher the radiation exposure.) Although the level of radiation used at a given time is still safe to the patient, it is not for the physician and/or assisting staff. Unfortunately, radiation exposure is accumulative. Because physicians and the staff have to do procedures and be exposed on a daily basis, this can result in health problems such as cancer and radiation burns. Radiation exposure to the staff is monitored by the radiation batches that they wear. The exposure levels are reported back to the staff on a quarterly basis. Depending on levels of exposure, physicians and staff may be obligated by law to decrease this exposure. This means that they have the right and obligation to refuse providing therapies where they may be overexposed to radiation. For this reason, physicians may decline to offer therapies such as radiofrequency ablation or implants to patients with a BMI above 40. 4.    Current Trends: Be advised that the current trend is to no longer offer certain therapies to patients with a BMI equal to, or above 35, due to increase perioperative risks, increased technical procedural difficulties, and excessive radiation exposure to healthcare personnel.  ______________________________________________________________________________________________

## 2021-07-21 NOTE — Progress Notes (Signed)
PROVIDER NOTE: Interpretation of information contained herein should be left to medically-trained personnel. Specific patient instructions are provided elsewhere under "Patient Instructions" section of medical record. This document was created in part using STT-dictation technology, any transcriptional errors that may result from this process are unintentional.  Patient: Kara [name] Type: Established DOB: 1950/09/12 MRN: [id number] PCP: Tracie Harrier, MD  Service: Procedure DOS: 07/21/2021 Setting: Ambulatory Location: Ambulatory outpatient facility Delivery: Face-to-face Provider: Gaspar Cola, MD Specialty: Interventional Pain Management Specialty designation: 09 Location: Outpatient facility Ref. Prov.: Tracie Harrier, MD    Primary Reason for Visit: Interventional Pain Management Treatment. CC: Back Pain  Procedure:           Type: Lumbar Facet, Medial Branch Radiofrequency Ablation (RFA) #1  Laterality: Left  Level: L2, L3, L4, L5, & S1 Medial Branch Level(s). These levels will denervate the L3-4, L4-5 and L5-S1 lumbar facet joints.  Imaging: Fluoroscopic guidance Anesthesia: Local anesthesia (1-2% Lidocaine) Anxiolysis: IV Versed         Sedation: Moderate conscious sedation. DOS: 07/21/2021  Performed by: Gaspar Cola, MD  Purpose: Therapeutic/Palliative Indications: Low back pain severe enough to impact quality of life or function. Indications: 1. Lumbar facet syndrome (Bilateral)   2. Spondylosis without myelopathy or radiculopathy, lumbosacral region   3. Lumbar facet arthropathy (L3-4, L4-5, and L5-S1) (Bilateral)   4. 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 DDD (degenerative disc disease), lumbosacral   6. Chronic low back pain (1ry area of Pain) (Bilateral) w/o sciatica   7. 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 Over-the-counter medications 2. Anti-inflammatory medications 3. Muscle relaxants 4. Membrane stabilizers 5. Opioids 6. Physical therapy and/or chiropractic manipulation 7. Modalities (Heat, ice, etc.) 8. Invasive techniques such as nerve blocks. [name] has attained more than 50% relief of the pain from a series of diagnostic injections conducted in separate occasions.  Pain Score: Pre-procedure: 8 /10 Post-procedure: 2 /10     Position / Prep / Materials:  Position: Prone  Prep solution: DuraPrep (Iodine Povacrylex [0.7% available iodine] and Isopropyl Alcohol, 74% w/w) Prep Area: Entire Lumbosacral Region (Lower back from mid-thoracic region to end of tailbone and from flank to flank.) Materials:  Tray: RFA (Radiofrequency) tray Needle(s):  Type: RFA (Teflon-coated radiofrequency ablation needles) Gauge (G): 22  Length: Regular (10cm) Qty: 5  Post-procedure evaluation    Type: Lumbar Facet, Medial Branch Radiofrequency Ablation (RFA) #1  Laterality: Right  Level: L2, L3, L4, L5, & S1 Medial Branch Level(s). These levels will denervate the L3-4 and L5-S1 lumbar facet joints.  Imaging: Fluoroscopic guidance Anesthesia: Local anesthesia (1-2% Lidocaine) Anxiolysis: IV Versed         Sedation: Moderate conscious sedation. DOS: 07/07/2021  Performed by: Gaspar Cola, MD  Purpose: Therapeutic/Palliative Indications: Low back pain severe enough to impact quality of life or function.  Pain Score: Pre-procedure: 10-Worst pain ever/10 Post-procedure: 0-No pain/10     Effectiveness:  Initial hour after procedure: 100 %. Subsequent 4-6 hours post-procedure: 100 %. Analgesia past initial 6 hours: 80 %. Ongoing improvement:   Analgesic: The patient indicates currently enjoying an ongoing  80% relief of her low back pain, mostly on the right side (treated side). Function: Ms. Ure reports improvement in function ROM: Ms. Tyer reports improvement in ROM  Pre-op H&P Assessment:  Kara [name] is a 71 y.o. (year old), female patient, seen today for interventional treatment. She  has a past surgical history that includes Knee surgery; Carpal tunnel release (Bilateral); Colon resection sigmoid (N/A, 11/02/2016); Colostomy (N/A, 11/02/2016); Sigmoidoscopy (N/A, 11/13/2016); PULMONARY VENOGRAPHY (N/A, 11/19/2016); Colonoscopy with propofol (N/A, 02/03/2017); Colon surgery (11/02/2016); IVC FILTER INSERTION (Right, 10/2016); Colostomy reversal (N/A, 02/24/2017); Appendectomy (N/A, 02/24/2017); Lysis of adhesion (N/A, 02/24/2017); laparoscopy (N/A, 02/24/2017); Ileo loop diversion (N/A, 02/24/2017); Sigmoidoscopy (N/A, 05/19/2017); Ileostomy closure (N/A, 06/01/2017); IVC FILTER REMOVAL (N/A, 08/09/2017); Sacroplasty (N/A, 11/08/2017); Cataract extraction w/PHACO (Left, 11/21/2018); Cataract extraction w/PHACO (Right, 12/12/2018); and Video bronchoscopy with endobronchial navigation (N/A, 02/23/2021). Kara [name] has a current medication list which includes the following prescription(s): alendronate, calcium carbonate, candesartan, cholecalciferol, vitamin b-12, cyclobenzaprine, dulaglutide, gabapentin, gabapentin, glipizide, glipizide, hydrocodone-acetaminophen, [START ON 07/28/2021] hydrocodone-acetaminophen, hydrocortisone, ibuprofen, meclizine, multivitamin with minerals, ropinirole, tolterodine, tramadol, warfarin, and warfarin, and the following Facility-Administered Medications: fentanyl. Her primarily concern today is the Back Pain  Initial Vital Signs:  Pulse/HCG Rate: 87ECG Heart Rate: 82 Temp: (!) 97.3 F (36.3 C) Resp: 16 BP: (!) 142/72 SpO2: 96 %  BMI: Estimated body mass index is 35.61 kg/m as calculated from the following:    Height as of this encounter: 5\' 3"  (1.6 m).   Weight as of this encounter: 201 lb (91.2 kg).  Risk Assessment: Allergies: Reviewed. She is allergic to adhesive [tape], morphine, other, latex, and morphine and related.  Allergy Precautions: None required Coagulopathies: Reviewed. None identified.  Blood-thinner therapy: None at this time Active Infection(s): Reviewed. None identified. Ms. Szeto is afebrile  Site Confirmation: Ms. Sarratt was asked to confirm the procedure and laterality before marking the site Procedure checklist: Completed Consent: Before the procedure and under the influence of no sedative(s), amnesic(s), or anxiolytics, the patient was informed of the treatment options, risks and possible complications. To fulfill our ethical and legal obligations, as recommended by the American Medical Association's Code of Ethics, I have informed the patient of my clinical impression; the nature and purpose of the treatment or procedure; the risks, benefits, and possible complications of the intervention; the alternatives, including doing nothing; the risk(s) and benefit(s) of the alternative treatment(s) or procedure(s); and the risk(s) and benefit(s) of doing nothing. The patient was provided information about the general risks and possible complications associated with the procedure. These may include, but are not limited to: failure to achieve desired goals, infection, bleeding, organ or nerve damage, allergic reactions, paralysis, and death. In addition, the patient was informed of those risks and complications associated to Spine-related procedures, such as failure to decrease pain; infection (i.e.: Meningitis, epidural or intraspinal abscess); bleeding (i.e.: epidural hematoma, subarachnoid hemorrhage, or any other type of intraspinal or peri-dural bleeding); organ or nerve damage (i.e.: Any type of peripheral nerve, nerve root, or spinal cord injury) with subsequent damage to sensory,  motor, and/or autonomic systems, resulting in permanent pain, numbness, and/or weakness of one or several areas of the body; allergic reactions; (i.e.: anaphylactic reaction); and/or death. Furthermore, the patient was informed of those risks and complications associated with the medications. These include, but are not limited to: allergic reactions (i.e.: anaphylactic or anaphylactoid reaction(s)); adrenal axis suppression; blood sugar elevation that in diabetics may result in ketoacidosis or comma; water retention that in patients with history of  congestive heart failure may result in shortness of breath, pulmonary edema, and decompensation with resultant heart failure; weight gain; swelling or edema; medication-induced neural toxicity; particulate matter embolism and blood vessel occlusion with resultant organ, and/or nervous system infarction; and/or aseptic necrosis of one or more joints. Finally, the patient was informed that Medicine is not an exact science; therefore, there is also the possibility of unforeseen or unpredictable risks and/or possible complications that may result in a catastrophic outcome. The patient indicated having understood very clearly. We have given the patient no guarantees and we have made no promises. Enough time was given to the patient to ask questions, all of which were answered to the patient's satisfaction. Ms. Dayton has indicated that she wanted to continue with the procedure. Attestation: I, the ordering provider, attest that I have discussed with the patient the benefits, risks, side-effects, alternatives, likelihood of achieving goals, and potential problems during recovery for the procedure that I have provided informed consent. Date   Time: 07/21/2021  7:54 AM  Pre-Procedure Preparation:  Monitoring: As per clinic protocol. Respiration, ETCO2, SpO2, BP, heart rate and rhythm monitor placed and checked for adequate function Safety Precautions: Patient was  assessed for positional comfort and pressure points before starting the procedure. Time-out: I initiated and conducted the "Time-out" before starting the procedure, as per protocol. The patient was asked to participate by confirming the accuracy of the "Time Out" information. Verification of the correct person, site, and procedure were performed and confirmed by me, the nursing staff, and the patient. "Time-out" conducted as per Joint Commission's Universal Protocol (UP.01.01.01). Time: 32  Description of Procedure:          Laterality: Left Levels:  L2, L3, L4, L5, & S1 Medial Branch Level(s), at the L3-4 and L5-S1 lumbar facet joints. Safety Precautions: Aspiration looking for blood return was conducted prior to all injections. At no point did we inject any substances, as a needle was being advanced. Before injecting, the patient was told to immediately notify me if she was experiencing any new onset of "ringing in the ears, or metallic taste in the mouth". No attempts were made at seeking any paresthesias. Safe injection practices and needle disposal techniques used. Medications properly checked for expiration dates. SDV (single dose vial) medications used. After the completion of the procedure, all disposable equipment used was discarded in the proper designated medical waste containers. Local Anesthesia: Protocol guidelines were followed. The patient was positioned over the fluoroscopy table. The area was prepped in the usual manner. The time-out was completed. The target area was identified using fluoroscopy. A 12-in long, straight, sterile hemostat was used with fluoroscopic guidance to locate the targets for each level blocked. Once located, the skin was marked with an approved surgical skin marker. Once all sites were marked, the skin (epidermis, dermis, and hypodermis), as well as deeper tissues (fat, connective tissue and muscle) were infiltrated with a small amount of a short-acting local  anesthetic, loaded on a 10cc syringe with a 25G, 1.5-in  Needle. An appropriate amount of time was allowed for local anesthetics to take effect before proceeding to the next step. Technical description of process:  Radiofrequency Ablation (RFA) L2 Medial Branch Nerve RFA: The target area for the L2 medial branch is at the junction of the postero-lateral aspect of the superior articular process and the superior, posterior, and medial edge of the transverse process of L3. Under fluoroscopic guidance, a Radiofrequency needle was inserted until contact was made with os over  the superior postero-lateral aspect of the pedicular shadow (target area). Sensory and motor testing was conducted to properly adjust the position of the needle. Once satisfactory placement of the needle was achieved, the numbing solution was slowly injected after negative aspiration for blood. 2.0 mL of the nerve block solution was injected without difficulty or complication. After waiting for at least 3 minutes, the ablation was performed. Once completed, the needle was removed intact. L3 Medial Branch Nerve RFA: The target area for the L3 medial branch is at the junction of the postero-lateral aspect of the superior articular process and the superior, posterior, and medial edge of the transverse process of L4. Under fluoroscopic guidance, a Radiofrequency needle was inserted until contact was made with os over the superior postero-lateral aspect of the pedicular shadow (target area). Sensory and motor testing was conducted to properly adjust the position of the needle. Once satisfactory placement of the needle was achieved, the numbing solution was slowly injected after negative aspiration for blood. 2.0 mL of the nerve block solution was injected without difficulty or complication. After waiting for at least 3 minutes, the ablation was performed. Once completed, the needle was removed intact. L4 Medial Branch Nerve RFA: The target area for  the L4 medial branch is at the junction of the postero-lateral aspect of the superior articular process and the superior, posterior, and medial edge of the transverse process of L5. Under fluoroscopic guidance, a Radiofrequency needle was inserted until contact was made with os over the superior postero-lateral aspect of the pedicular shadow (target area). Sensory and motor testing was conducted to properly adjust the position of the needle. Once satisfactory placement of the needle was achieved, the numbing solution was slowly injected after negative aspiration for blood. 2.0 mL of the nerve block solution was injected without difficulty or complication. After waiting for at least 3 minutes, the ablation was performed. Once completed, the needle was removed intact. L5 Medial Branch Nerve RFA: The target area for the L5 medial branch is at the junction of the postero-lateral aspect of the superior articular process of S1 and the superior, posterior, and medial edge of the sacral ala. Under fluoroscopic guidance, a Radiofrequency needle was inserted until contact was made with os over the superior postero-lateral aspect of the pedicular shadow (target area). Sensory and motor testing was conducted to properly adjust the position of the needle. Once satisfactory placement of the needle was achieved, the numbing solution was slowly injected after negative aspiration for blood. 2.0 mL of the nerve block solution was injected without difficulty or complication. After waiting for at least 3 minutes, the ablation was performed. Once completed, the needle was removed intact. S1 Medial Branch Nerve RFA: The target area for the S1 medial branch is located inferior to the junction of the S1 superior articular process and the L5 inferior articular process, posterior, inferior, and lateral to the 6 o'clock position of the L5-S1 facet joint, just superior to the S1 posterior foramen. Under fluoroscopic guidance, the  Radiofrequency needle was advanced until contact was made with os over the Target area. Sensory and motor testing was conducted to properly adjust the position of the needle. Once satisfactory placement of the needle was achieved, the numbing solution was slowly injected after negative aspiration for blood. 2.0 mL of the nerve block solution was injected without difficulty or complication. After waiting for at least 3 minutes, the ablation was performed. Once completed, the needle was removed intact. Radiofrequency lesioning (ablation):  Radiofrequency Generator:  Medtronic AccurianTM AG 1000 RF Generator Sensory Stimulation Parameters: 50 Hz was used to locate & identify the nerve, making sure that the needle was positioned such that there was no sensory stimulation below 0.3 V or above 0.7 V. Motor Stimulation Parameters: 2 Hz was used to evaluate the motor component. Care was taken not to lesion any nerves that demonstrated motor stimulation of the lower extremities at an output of less than 2.5 times that of the sensory threshold, or a maximum of 2.0 V. Lesioning Technique Parameters: Standard Radiofrequency settings. (Not bipolar or pulsed.) Temperature Settings: 80 degrees C Lesioning time: 60 seconds Intra-operative Compliance: Compliant  Once the entire procedure was completed, the treated area was cleaned, making sure to leave some of the prepping solution back to take advantage of its long term bactericidal properties.    Illustration of the posterior view of the lumbar spine and the posterior neural structures. Laminae of L2 through S1 are labeled. DPRL5, dorsal primary ramus of L5; DPRS1, dorsal primary ramus of S1; DPR3, dorsal primary ramus of L3; FJ, facet (zygapophyseal) joint L3-L4; I, inferior articular process of L4; LB1, lateral branch of dorsal primary ramus of L1; IAB, inferior articular branches from L3 medial branch (supplies L4-L5 facet joint); IBP, intermediate branch plexus;  MB3, medial branch of dorsal primary ramus of L3; NR3, third lumbar nerve root; S, superior articular process of L5; SAB, superior articular branches from L4 (supplies L4-5 facet joint also); TP3, transverse process of L3.  Vitals:   07/21/21 0930 07/21/21 0940 07/21/21 0950 07/21/21 1005  BP:  112/68 114/70 116/71  Pulse:      Resp: 20 17 16 16   Temp:  (!) 97.3 F (36.3 C)  97.7 F (36.5 C)  SpO2: 95% 95% 96% 97%  Weight:      Height:       Start Time: 0840 hrs. End Time: 0929 hrs.  Imaging Guidance (Spinal):          Type of Imaging Technique: Fluoroscopy Guidance (Spinal) Indication(s): Assistance in needle guidance and placement for procedures requiring needle placement in or near specific anatomical locations not easily accessible without such assistance. Exposure Time: Please see nurses notes. Contrast: None used. Fluoroscopic Guidance: I was personally present during the use of fluoroscopy. "Tunnel Vision Technique" used to obtain the best possible view of the target area. Parallax error corrected before commencing the procedure. "Direction-depth-direction" technique used to introduce the needle under continuous pulsed fluoroscopy. Once target was reached, antero-posterior, oblique, and lateral fluoroscopic projection used confirm needle placement in all planes. Images permanently stored in EMR. Interpretation: No contrast injected. I personally interpreted the imaging intraoperatively. Adequate needle placement confirmed in multiple planes. Permanent images saved into the patient's record.  Antibiotic Prophylaxis:   Anti-infectives (From admission, onward)    None      Indication(s): None identified  Post-operative Assessment:  Post-procedure Vital Signs:  Pulse/HCG Rate: 8781 Temp: 97.7 F (36.5 C) Resp: 16 BP: 116/71 SpO2: 97 %  EBL: None  Complications: No immediate post-treatment complications observed by team, or reported by patient.  Note: The patient  tolerated the entire procedure well. A repeat set of vitals were taken after the procedure and the patient was kept under observation following institutional policy, for this type of procedure. Post-procedural neurological assessment was performed, showing return to baseline, prior to discharge. The patient was provided with post-procedure discharge instructions, including a section on how to identify potential problems. Should any problems arise concerning this procedure, the  patient was given instructions to immediately contact us, at any time, without hesitation. In any case, we plan to contact the patient by telephone for a follow-up status report regarding this interventional procedure.  Comments:  No additional relevant information.  Plan of Care  Orders:  Orders Placed This Encounter  Procedures   Radiofrequency,Lumbar    Scheduling Instructions:     Side(s): Left-sided     Level: L3-4, L4-5, & L5-S1 Facets (L2, L3, L4, L5, & S1 Medial Branch Nerves)     Sedation: With Sedation.     Timeframe: Today    Order Specific Question:   Where will this procedure be performed?    Answer:   [name]   DG PAIN [facility] C-ARM 1-60 MIN NO REPORT    Intraoperative interpretation by procedural physician at [facility].    Standing Status:   Standing    Number of Occurrences:   1    Order Specific Question:   Reason for exam:    Answer:   Assistance in needle guidance and placement for procedures requiring needle placement in or near specific anatomical locations not easily accessible without such assistance.   Informed Consent Details: Physician/Practitioner Attestation; Transcribe to consent form and obtain patient signature    Nursing Order: Transcribe to consent form and obtain patient signature. Note: Always confirm laterality of pain with Ms. Manalo, before procedure.    Order Specific Question:   Physician/Practitioner attestation of informed consent for  procedure/surgical case    Answer:   I, the physician/practitioner, attest that I have discussed with the patient the benefits, risks, side effects, alternatives, likelihood of achieving goals and potential problems during recovery for the procedure that I have provided informed consent.    Order Specific Question:   Procedure    Answer:   Lumbar Facet Radiofrequency Ablation    Order Specific Question:   Physician/Practitioner performing the procedure    Answer:   Breion Novacek A. Dossie Arbour, MD    Order Specific Question:   Indication/Reason    Answer:   Low Back Pain, with our without leg pain, due to Facet Joint Arthralgia (Joint Pain) known as Lumbar Facet Syndrome, secondary to Lumbar, and/or Lumbosacral Spondylosis (Arthritis of the Spine), without myelopathy or radiculopathy (Nerve Damage).   Care order/instruction: Please confirm that the patient has stopped the Coumadin (Warfarin) X 5 days prior to procedure or surgery.    Please confirm that the patient has stopped the Coumadin (Warfarin) X 5 days prior to procedure or surgery.    Standing Status:   Standing    Number of Occurrences:   1   Provide equipment / supplies at bedside    "Radiofrequency Tray"; Large hemostat (x1); Small hemostat (x1); Towels (x8); 4x4 sterile sponge pack (x1) Needle type: Teflon-coated Radiofrequency Needle (Disposable   single use) Size: Regular Quantity: 5    Standing Status:   Standing    Number of Occurrences:   1    Order Specific Question:   Specify    Answer:   Radiofrequency Tray   Nursing Instructions:    Please complete this patient's postprocedure evaluation.    Scheduling Instructions:     Please complete this patient's postprocedure evaluation.   Bleeding precautions    Standing Status:   Standing    Number of Occurrences:   1   Latex precautions    Activate Latex-Free Protocol.    Standing Status:   Standing    Number of Occurrences:   1  Chronic Opioid Analgesic:  Oxycodone IR 10 mg, 1  tab PO qd,  from PCP. No chronic opioid analgesics therapy prescribed by our practice, 2ry to Medication Agreement Violation. (Getting oxycodone from PCP in multiple occasions, according to PMP)(11/21/20; 12/26/20; 01/16/21; 02/13/21). Opioid analgesic pharmacotherapy D/C'ed on 03/03/2021. MME: 20 mg/day.   Medications ordered for procedure: Meds ordered this encounter  Medications   lidocaine (XYLOCAINE) 2 % (with pres) injection 400 mg   lactated ringers infusion 1,000 mL   midazolam (VERSED) 5 MG/5ML injection 0.5-2 mg    Make sure Flumazenil is available in the pyxis when using this medication. If oversedation occurs, administer 0.2 mg IV over 15 sec. If after 45 sec no response, administer 0.2 mg again over 1 min; may repeat at 1 min intervals; not to exceed 4 doses (1 mg)   fentaNYL (SUBLIMAZE) injection 25-50 mcg    Make sure Narcan is available in the pyxis when using this medication. In the event of respiratory depression (RR< 8/min): Titrate NARCAN (naloxone) in increments of 0.1 to 0.2 mg IV at 2-3 minute intervals, until desired degree of reversal.   ropivacaine (PF) 2 mg/mL (0.2%) (NAROPIN) injection 9 mL   triamcinolone acetonide (KENALOG-40) injection 40 mg   HYDROcodone-acetaminophen (NORCO/VICODIN) 5-325 MG tablet    Sig: Take 1 tablet by mouth every 8 (eight) hours as needed for up to 7 days for severe pain. Must last 7 days.    Dispense:  21 tablet    Refill:  0    For acute post-operative pain. Not to be refilled. Must last 7 days.   HYDROcodone-acetaminophen (NORCO/VICODIN) 5-325 MG tablet    Sig: Take 1 tablet by mouth every 8 (eight) hours as needed for up to 7 days for severe pain. Must last for 7 days.    Dispense:  21 tablet    Refill:  0    For acute post-operative pain. Not to be refilled. Must last 7 days.   Medications administered: We administered lidocaine, lactated ringers, midazolam, fentaNYL, ropivacaine (PF) 2 mg/mL (0.2%), and triamcinolone acetonide.  See  the medical record for exact dosing, route, and time of administration.  Follow-up plan:   Return in about 6 weeks (around 09/01/2021) for Proc-day (T,Th), (PPE), (F2F).       Interventional Therapies  Risk   Complexity Considerations:   Estimated body mass index is 37.45 kg/m as calculated from the following:   Height as of 05/05/21: 5\' 3"  (1.6 m).   Weight as of 05/13/21: 211 lb 6.4 oz (95.9 kg). 1.  Coumadin anticoagulation  (Stop: 5 days   Re-start: 2hrs) 2.  Latex allergy  3.  Morbid obesity (class III) (BMI>40)  4.  No Lumbar RFA due to morbid obesity    Planned   Pending:   Therapeutic left lumbar facet RFA #1 (07/21/2021)    Under consideration:      Completed:   Therapeutic right lumbar facet RFA x1 (07/07/2021) (100/100/80/80)  Palliative/Therapeutic left L4-5 LESI x1 (11/10/2017) (100/80/0/0-25)  Palliative left lumbar facet MBB x17 (05/05/2021) (100/100/75/85)  Palliative right lumbar facet MBB x23 (05/05/2021) (100/100/75/85)  Palliative right SI joint block x8 (not effective) (03/08/2019) (100/100/100/0)  Diagnostic left SI joint block x1 (not effective) (03/08/2019) (100/100/100/0)  Palliative/Therapeutic (Midline) caudal ESI x3 (06/08/2018) (100/100/100 x 2-3 days/0)    Therapeutic   Palliative (PRN) options:   Palliative lumbar facet MBB       Recent Visits Date Type Provider Dept  07/07/21 Procedure visit Milinda Pointer, MD  [name]  05/27/21 Office Visit Milinda Pointer, MD [name]  05/20/21 Office Visit Milinda Pointer, MD [name]  05/05/21 Procedure visit Milinda Pointer, MD [name]  Showing recent visits within past 90 days and meeting all other requirements Today's Visits Date Type Provider Dept  07/21/21 Procedure visit Milinda Pointer, MD [name]  Showing today's visits and meeting all other requirements Future Appointments Date Type Provider Dept  09/01/21  Appointment Milinda Pointer, MD [name]  Showing future appointments within next 90 days and meeting all other requirements  Disposition: Discharge home  Discharge (Date   Time): 07/21/2021; 1006 hrs.   Primary Care Physician: Tracie Harrier, MD Location: [facility] Note by: Gaspar Cola, MD Date: 07/21/2021; Time: 1:48 PM  Disclaimer:  Medicine is not an Chief Strategy Officer. The only guarantee in medicine is that nothing is guaranteed. It is important to note that the decision to proceed with this intervention was based on the information collected from the patient. The Data and conclusions were drawn from the patient's questionnaire, the interview, and the physical examination. Because the information was provided in large part by the patient, it cannot be guaranteed that it has not been purposely or unconsciously manipulated. Every effort has been made to obtain as much relevant data as possible for this evaluation. It is important to note that the conclusions that lead to this procedure are derived in large part from the available data. Always take into account that the treatment will also be dependent on availability of resources and existing treatment guidelines, considered by other Pain Management Practitioners as being common knowledge and practice, at the time of the intervention. For Medico-Legal purposes, it is also important to point out that variation in procedural techniques and pharmacological choices are the acceptable norm. The indications, contraindications, technique, and results of the above procedure should only be interpreted and judged by a Board-Certified Interventional Pain Specialist with extensive familiarity and expertise in the same exact procedure and technique.

## 2021-07-21 NOTE — Progress Notes (Signed)
Safety precautions to be maintained throughout the outpatient stay will include: orient to surroundings, keep bed in low position, maintain call bell within reach at all times, provide assistance with transfer out of bed and ambulation.  

## 2021-07-22 ENCOUNTER — Ambulatory Visit: Payer: PPO | Admitting: Oncology

## 2021-07-22 ENCOUNTER — Telehealth: Payer: Self-pay | Admitting: *Deleted

## 2021-07-22 NOTE — Telephone Encounter (Signed)
Post procedure call:   no  questions or concerns.  

## 2021-07-29 ENCOUNTER — Other Ambulatory Visit: Payer: Self-pay

## 2021-07-29 ENCOUNTER — Ambulatory Visit
Admission: RE | Admit: 2021-07-29 | Discharge: 2021-07-29 | Disposition: A | Discharge status: Home / Self Care | Payer: PPO | Source: Ambulatory Visit | Attending: Oncology | Admitting: Oncology

## 2021-07-29 DIAGNOSIS — R918 Other nonspecific abnormal finding of lung field: Secondary | ICD-10-CM | POA: Diagnosis not present

## 2021-07-29 DIAGNOSIS — R911 Solitary pulmonary nodule: Secondary | ICD-10-CM | POA: Insufficient documentation

## 2021-07-29 DIAGNOSIS — I700 Atherosclerosis of aorta: Secondary | ICD-10-CM | POA: Diagnosis not present

## 2021-07-31 ENCOUNTER — Encounter: Payer: Self-pay | Admitting: Oncology

## 2021-07-31 ENCOUNTER — Inpatient Hospital Stay: Payer: PPO | Attending: Oncology | Admitting: Oncology

## 2021-07-31 ENCOUNTER — Other Ambulatory Visit: Payer: Self-pay

## 2021-07-31 VITALS — BP 125/78 | HR 96 | Temp 98.1°F | Resp 18 | Wt 208.1 lb

## 2021-07-31 DIAGNOSIS — Z85118 Personal history of other malignant neoplasm of bronchus and lung: Secondary | ICD-10-CM | POA: Diagnosis not present

## 2021-07-31 DIAGNOSIS — R911 Solitary pulmonary nodule: Secondary | ICD-10-CM | POA: Insufficient documentation

## 2021-07-31 DIAGNOSIS — Z08 Encounter for follow-up examination after completed treatment for malignant neoplasm: Secondary | ICD-10-CM

## 2021-07-31 DIAGNOSIS — Z8616 Personal history of COVID-19: Secondary | ICD-10-CM | POA: Insufficient documentation

## 2021-07-31 NOTE — Progress Notes (Signed)
Hematology/Oncology Consult note [facility]  [phone] [phone]  Patient Care Team: Tracie Harrier, MD as PCP - General (Internal Medicine) Telford Nab, RN as Oncology Nurse Navigator Sindy Guadeloupe, MD as Consulting Physician (Oncology)   Name of the patient: Kara [name]  [id number]  [birth year]   Date of visit: 07/31/21  Diagnosis-resume right upper lobe lung cancer s/p SBRT  Chief complaint/ Reason for visit-discuss CT scan results and further management  Heme/Onc history: Patient is a 71 year old female underwent CT chest with contrast in May 2022 to follow-up on lung nodule that was incidentally noted on CT abdomen in March 2022 when she was admitted for [place].  At that time she was noted to have a right lower lobe 1.3 x 1.1 cm right lower lobe nodule.  CT chest in May 2022 showed right upper lobe nodule measuring 1.4 cm which was also hypermetabolic on PET scan with an SUV of 3.3.  No other enlarged mediastinal or hilar lymph nodes or distant metastatic disease was noted.   Repeat CT scan in August 2022 showed mild increase in the size of the right upper lobe lung nodule to 1.8 cm.  No evidence of locoregional adenopathy.    Interval history-reports doing well presently.  She has baseline fatigue but denies any worsening exertional shortness of breath, cough, fever or sputum production.  ECOG PS- 1 Pain scale- 0   Review of systems- Review of Systems  Constitutional:  Positive for malaise/fatigue. Negative for chills, fever and weight loss.  HENT:  Negative for congestion, ear discharge and nosebleeds.   Eyes:  Negative for blurred vision.  Respiratory:  Negative for cough, hemoptysis, sputum production, shortness of breath and wheezing.   Cardiovascular:  Negative for chest pain, palpitations, orthopnea and claudication.  Gastrointestinal:  Negative for abdominal pain, blood in stool, constipation, diarrhea,  heartburn, melena, nausea and vomiting.  Genitourinary:  Negative for dysuria, flank pain, frequency, hematuria and urgency.  Musculoskeletal:  Negative for back pain, joint pain and myalgias.  Skin:  Negative for rash.  Neurological:  Negative for dizziness, tingling, focal weakness, seizures, weakness and headaches.  Endo/Heme/Allergies:  Does not bruise/bleed easily.  Psychiatric/Behavioral:  Negative for depression and suicidal ideas. The patient does not have insomnia.      Allergies  Allergen Reactions   Adhesive [Tape] Other (See Comments)    Pt reports, when removing tape from skin most tapes pull her skin off. 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, 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   CHF (congestive heart failure) ([place])    followed colon resection, "wouldn't let me get up"   Diabetes ([place])    type 2   Diverticulitis 2018   History of being hospitalized    1 month ago for 3 days, vomiting, and kink in upper intestine   History of hiatal hernia    Hypertension    Osteoporosis    PE (pulmonary thromboembolism) ([place])    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[place])    Vertigo      Past Surgical History:  Procedure  Laterality Date   APPENDECTOMY N/A 02/24/2017   Procedure: Incidental  APPENDECTOMY;  Surgeon: [name], MD;  Location: [name];  Service: General;  Laterality: N/A;   CARPAL TUNNEL RELEASE Bilateral    CATARACT EXTRACTION W/PHACO Left 11/21/2018   Procedure: CATARACT EXTRACTION PHACO AND INTRAOCULAR LENS PLACEMENT ([place]) LEFT DIABETES;  Surgeon: Birder Robson, MD;  Location: [place];  Service: Ophthalmology;  Laterality: Left;  latex  sensitivity Diabetic - oral meds   CATARACT EXTRACTION W/PHACO Right 12/12/2018   Procedure: CATARACT EXTRACTION PHACO AND INTRAOCULAR LENS PLACEMENT ([place]) RIGHT DIABETES;  Surgeon: Birder Robson, MD;  Location: [place];  Service: Ophthalmology;  Laterality: Right;  Diabetes-oral med Latex sensitiviy   COLON RESECTION SIGMOID N/A 11/02/2016   Procedure: COLON RESECTION SIGMOID;  Surgeon: [name], MD;  Location: [name];  Service: General;  Laterality: N/A;   COLON SURGERY  11/02/2016   COLONOSCOPY WITH PROPOFOL N/A 02/03/2017   Procedure: COLONOSCOPY WITH PROPOFOL;  Surgeon: Lin Landsman, MD;  Location: [facility];  Service: Gastroenterology;  Laterality: N/A;   COLOSTOMY N/A 11/02/2016   Procedure: COLOSTOMY;  Surgeon: [name], MD;  Location: [name];  Service: General;  Laterality: N/A;   COLOSTOMY REVERSAL N/A 02/24/2017   Procedure: COLOSTOMY REVERSAL, ostomy takedown, spleenic flexure mobilization, excision rectal stump/distal sigmoid, anastomosis with suture reinforcement;  Surgeon: [name], MD;  Location: [name];  Service: General;  Laterality: N/A;   ILEO LOOP DIVERSION N/A 02/24/2017   Procedure: ILEO LOOP COLOSTOMY;  Surgeon: [name], MD;  Location: [name];  Service: General;  Laterality: N/A;   ILEOSTOMY CLOSURE N/A 06/01/2017   Procedure: LOOP ILEOSTOMY TAKEDOWN;  Surgeon: [name], MD;  Location: [name];  Service: General;  Laterality: N/A;   IVC FILTER INSERTION Right 10/2016   IVC FILTER REMOVAL N/A 08/09/2017   Procedure: IVC FILTER REMOVAL;  Surgeon: Katha Cabal, MD;  Location: [place] [facility];  Service: Cardiovascular;  Laterality: N/A;   KNEE SURGERY     torn menicus   LAPAROSCOPY N/A 02/24/2017   Procedure: LAPAROSCOPY DIAGNOSTIC;  Surgeon: [name], MD;  Location: [name];  Service: General;  Laterality: N/A;   LYSIS OF ADHESION N/A 02/24/2017   Procedure: LYSIS OF ADHESION;   Surgeon: [name], MD;  Location: [name];  Service: General;  Laterality: N/A;   PULMONARY VENOGRAPHY N/A 11/19/2016   Procedure: Pulmonary Venography; IVC filter placement; possible pulmonary thrombectomy;  Surgeon: Katha Cabal, MD;  Location: [place] [facility];  Service: Cardiovascular;  Laterality: N/A;   SACROPLASTY N/A 11/08/2017   Procedure: SACROPLASTY S2;  Surgeon: Hessie Knows, MD;  Location: [name];  Service: Orthopedics;  Laterality: N/A;   SIGMOIDOSCOPY N/A 11/13/2016   Procedure: endoscopic  flexible SIGMOIDOSCOPY;  Surgeon: Florene Glen, MD;  Location: [name];  Service: General;  Laterality: N/A;   SIGMOIDOSCOPY N/A 05/19/2017   Procedure: Beryle Quant;  Surgeon: [name], MD;  Location: [name];  Service: General;  Laterality: N/A;   VIDEO BRONCHOSCOPY WITH ENDOBRONCHIAL NAVIGATION N/A 02/23/2021   Procedure: ROBOTIC VIDEO BRONCHOSCOPY WITH ENDOBRONCHIAL NAVIGATION;  Surgeon: Tyler Pita, MD;  Location: [name];  Service: Pulmonary;  Laterality: N/A;    Social History   Socioeconomic History   Marital status: Married    Spouse name: Not on file   Number of children: Not on file   Years of education: Not on file   Highest education level: Not on file  Occupational History  Not on file  Tobacco Use   Smoking status: Never   Smokeless tobacco: Never  Vaping Use   Vaping Use: Never used  Substance and Sexual Activity   Alcohol use: No    Alcohol/week: 0.0 standard drinks   Drug use: No   Sexual activity: Never  Other Topics Concern   Not on file  Social History Narrative   Not on file   Social Determinants of Health   Financial Resource Strain: Not on file  Food Insecurity: Not on file  Transportation Needs: Not on file  Physical Activity: Not on file  Stress: Not on file  Social Connections: Not on file  Intimate Partner Violence: Not on file    Family History  Problem Relation Age of Onset   Diabetes  Mother    Heart disease Mother    Cancer Mother    Breast cancer Mother        >50   Heart disease Father    Diabetes Sister    Heart disease Sister      Current Outpatient Medications:    alendronate (FOSAMAX) 70 MG tablet, Take 70 mg by mouth every Friday. Take with a full glass of water on an empty stomach., Disp: , Rfl:    calcium carbonate (CALCIUM 600) 600 MG TABS tablet, Take 1 tablet (600 mg total) by mouth 2 (two) times daily with a meal., Disp: 60 tablet, Rfl: 5   candesartan (ATACAND) 16 MG tablet, Take 16 mg by mouth in the morning., Disp: , Rfl:    cholecalciferol (VITAMIN D) 25 MCG (1000 UNIT) tablet, Take 1,000 Units by mouth in the morning., Disp: , Rfl:    Cyanocobalamin (VITAMIN B-12) 5000 MCG TBDP, Take 5,000 mcg by mouth in the morning., Disp: , Rfl:    cyclobenzaprine (FLEXERIL) 10 MG tablet, Take 10 mg by mouth at bedtime., Disp: , Rfl:    Dulaglutide 1.5 MG/0.5ML SOPN, Inject 1.5 mg into the skin every Saturday., Disp: , Rfl:    gabapentin (NEURONTIN) 100 MG capsule, Take 200 mg by mouth with breakfast, with lunch, and with evening meal., Disp: , Rfl:    gabapentin (NEURONTIN) 300 MG capsule, Take 300 mg by mouth at bedtime., Disp: , Rfl:    glipiZIDE (GLUCOTROL XL) 5 MG 24 hr tablet, Take 5 mg by mouth daily with breakfast. 5 mg +10 mg=15 mg total in the morning, Disp: , Rfl:    glipiZIDE (GLUCOTROL) 10 MG tablet, Take 10 mg by mouth daily before breakfast. 10 mg +5 mg=15 mg total in the morning, Disp: , Rfl:    HYDROcodone-acetaminophen (NORCO/VICODIN) 5-325 MG tablet, Take 1 tablet by mouth every 8 (eight) hours as needed for up to 7 days for severe pain. Must last for 7 days., Disp: 21 tablet, Rfl: 0   ibuprofen (ADVIL) 800 MG tablet, ibuprofen 800 mg tablet, Disp: , Rfl:    meclizine (ANTIVERT) 25 MG tablet, Take 25 mg by mouth 3 (three) times daily as needed for dizziness., Disp: , Rfl:    Multiple Vitamin (MULTIVITAMIN WITH MINERALS) TABS tablet, Take 1 tablet  by mouth in the morning., Disp: , Rfl:    rOPINIRole (REQUIP) 0.5 MG tablet, Take 0.5 mg by mouth at bedtime., Disp: , Rfl:    tolterodine (DETROL) 2 MG tablet, Take 2 mg by mouth 2 (two) times daily., Disp: , Rfl:    traMADol (ULTRAM) 50 MG tablet, Take 50 mg by mouth at bedtime as needed., Disp: , Rfl:  warfarin (COUMADIN) 1 MG tablet, Take 1 mg by mouth See admin instructions. Take 1 tablet (1 mg) with 1 tablet (6 mg) by mouth on Fridays, Saturdays & Sundays (7 mg), Disp: , Rfl:    warfarin (COUMADIN) 6 MG tablet, Take 6 mg by mouth See admin instructions. Take 1 tablet (6 mg) by mouth on Mondays, Tuesdays, Wednesdays, & Thursdays Take 1 tablet (6 mg) with 1 tablet (1 mg) by mouth on Fridays, Saturdays & Sundays (7 mg), Disp: , Rfl:    hydrocortisone 2.5 % cream, Apply 1 application topically 2 (two) times daily as needed (rash/redness). (Patient not taking: Reported on 07/31/2021), Disp: , Rfl:    nystatin (MYCOSTATIN) 100000 UNIT/ML suspension, Swish and swallow 5 mLs 3 (three) times a day for 10 days (Patient not taking: Reported on 07/31/2021), Disp: , Rfl:    Oxycodone HCl 10 MG TABS, Take 10 mg by mouth 2 (two) times daily as needed. (Patient not taking: Reported on 07/31/2021), Disp: , Rfl:   Physical exam:  Vitals:   07/31/21 1133  BP: 125/78  Pulse: 96  Resp: 18  Temp: 98.1 F (36.7 C)  SpO2: 97%  Weight: 208 lb 1.6 oz (94.4 kg)   Physical Exam Constitutional:      General: She is not in acute distress. Cardiovascular:     Rate and Rhythm: Normal rate and regular rhythm.     Heart sounds: Normal heart sounds.  Pulmonary:     Effort: Pulmonary effort is normal.     Breath sounds: Normal breath sounds.  Abdominal:     General: Bowel sounds are normal.     Palpations: Abdomen is soft.  Skin:    General: Skin is warm and dry.  Neurological:     Mental Status: She is alert and oriented to person, place, and time.     CMP Latest Ref Rng & Units 02/19/2021  Glucose 70 - 99  mg/dL 150(H)  BUN 8 - 23 mg/dL 16  Creatinine 0.44 - 1.00 mg/dL 0.70  Sodium 135 - 145 mmol/L 139  Potassium 3.5 - 5.1 mmol/L 3.5  Chloride 98 - 111 mmol/L 106  CO2 22 - 32 mmol/L 25  Calcium 8.9 - 10.3 mg/dL 9.0  Total Protein 6.5 - 8.1 g/dL 6.9  Total Bilirubin 0.3 - 1.2 mg/dL 0.8  Alkaline Phos 38 - 126 U/L 43  AST 15 - 41 U/L 23  ALT 0 - 44 U/L 27   CBC Latest Ref Rng & Units 02/19/2021  WBC 4.0 - 10.5 K/uL 6.1  Hemoglobin 12.0 - 15.0 g/dL 13.2  Hematocrit 36.0 - 46.0 % 40.5  Platelets 150 - 400 K/uL 249    No images are attached to the encounter.  CT Chest Wo Contrast  Addendum Date: 07/30/2021   ADDENDUM REPORT: 07/30/2021 21:20 ADDENDUM: The original report was by Dr. Van Clines. The following addendum is by Dr. Van Clines: Tracking: * onc * Electronically Signed   By: Van Clines M.D.   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 Reported COVID infection last month. EXAM: CT CHEST WITHOUT CONTRAST TECHNIQUE: Multidetector CT imaging of the chest was performed following the standard protocol without IV contrast. RADIATION DOSE REDUCTION: This exam was performed according to the departmental dose-optimization program which includes automated exposure control, adjustment of the mA and/or kV according to patient size and/or use of iterative reconstruction technique. COMPARISON:  Multiple exams, including 02/18/2021 FINDINGS: Cardiovascular: Coronary, aortic arch, and branch vessel atherosclerotic vascular disease. Mediastinum/Nodes: No pathologic adenopathy observed. Lungs/Pleura: In the region of the prior right upper lobe nodule, there is indistinct airspace opacity obscuring the nodule. This opacity involves the right upper lobe and the superior segment right lower lobe, with minimal involvement of the upper portion of the right  middle lobe. The appearance is most compatible with radiation pneumonitis. Associated air bronchograms are noted. The original nodule is difficult to distinctly visualized separate from the surrounding airspace opacity. Stable 2 by 3 mm right lower lobe nodule along the right hemidiaphragm, image 85 series 3. Stable 3 mm left upper lobe nodule on image 64 series 3. Mild linear subsegmental atelectasis or scarring inferiorly in the left lower lobe and lingula. Upper Abdomen: Numerous gallstones fill the gallbladder. One of the stones measures 0.9 cm in diameter. Musculoskeletal: Thoracic spondylosis. IMPRESSION: 1. Patchy airspace opacities in the region of the prior pulmonary nodule, a obscuring the original nodule. 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 2. No pathologic adenopathy identified. 3. Two nodules in the 2-3 mm range are identified and may merit surveillance. 4. Numerous gallstones fill the gallbladder. 5. Aortic Atherosclerosis (ICD10-I70.0). Potential mild left main coronary artery atherosclerotic calcification. Electronically Signed: By: Van Clines M.D. On: 07/30/2021 11:51   DG PAIN CLINIC C-ARM 1-60 MIN NO REPORT  Result Date: 07/21/2021 Fluoro was used, but no Radiologist interpretation will be provided. Please refer to "NOTES" tab for provider progress note.  DG PAIN CLINIC C-ARM 1-60 MIN NO REPORT  Result Date: 07/07/2021 Fluoro was used, but no Radiologist interpretation will be provided. Please refer to "NOTES" tab for provider progress note.    Assessment and plan- Patient is a 71 y.o. female with history of presumed right upper lobe lung cancer s/p SBRT here for routine follow-up  I have reviewed CT chest images independently and discussed findings with the patientWhich shows airspace opacities and evolving changes of radiation pneumonitis in the right upper lobe lung.  No pathologic adenopathy.  2 nonspecific 2 to 3 mm lung nodules.  Patient has known gallstones.   Overall she is doing well postradiation.  No signs and symptoms of worsening pneumonitis that would require active treatment.  I will see her back in 4 months with CT chest without contrast   Visit Diagnosis 1. Encounter for follow-up surveillance of lung cancer      Dr. Randa Evens, MD, MPH [facility] [phone] 07/31/2021 3:48 PM

## 2021-08-07 DIAGNOSIS — E119 Type 2 diabetes mellitus without complications: Secondary | ICD-10-CM | POA: Diagnosis not present

## 2021-08-11 ENCOUNTER — Ambulatory Visit: Payer: PPO

## 2021-08-11 ENCOUNTER — Telehealth: Payer: Self-pay

## 2021-08-11 NOTE — Telephone Encounter (Signed)
She is having pain in left hip and wants to know what she can get done. ?

## 2021-08-11 NOTE — Telephone Encounter (Signed)
Please schedule for a VV with Dr Dossie Arbour asap.  ?

## 2021-08-11 NOTE — Telephone Encounter (Signed)
She has an appt 4/11 which is his first available anyway so I will leave it as is.  ?

## 2021-08-14 ENCOUNTER — Telehealth: Payer: Self-pay | Admitting: *Deleted

## 2021-08-14 NOTE — Telephone Encounter (Signed)
Patient left vm and needs to change her follow-up apt with Dr. Baruch Gouty from 3/29 to 3/30. 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 Please call patient to r/s her apts. ?

## 2021-08-19 ENCOUNTER — Ambulatory Visit: Payer: PPO | Admitting: Radiation Oncology

## 2021-08-20 ENCOUNTER — Ambulatory Visit
Admission: RE | Admit: 2021-08-20 | Discharge: 2021-08-20 | Disposition: A | Discharge status: Home / Self Care | Payer: PPO | Source: Ambulatory Visit | Attending: Radiation Oncology | Admitting: Radiation Oncology

## 2021-08-20 ENCOUNTER — Other Ambulatory Visit: Payer: Self-pay

## 2021-08-20 ENCOUNTER — Encounter: Payer: Self-pay | Admitting: Radiation Oncology

## 2021-08-20 VITALS — BP 131/58 | HR 94 | Temp 97.5°F | Resp 16 | Ht 63.0 in | Wt 212.5 lb

## 2021-08-20 DIAGNOSIS — C3411 Malignant neoplasm of upper lobe, right bronchus or lung: Secondary | ICD-10-CM | POA: Insufficient documentation

## 2021-08-20 DIAGNOSIS — R5383 Other fatigue: Secondary | ICD-10-CM | POA: Insufficient documentation

## 2021-08-20 DIAGNOSIS — Z923 Personal history of irradiation: Secondary | ICD-10-CM | POA: Insufficient documentation

## 2021-08-20 DIAGNOSIS — Z08 Encounter for follow-up examination after completed treatment for malignant neoplasm: Secondary | ICD-10-CM | POA: Diagnosis not present

## 2021-08-20 DIAGNOSIS — Z8616 Personal history of COVID-19: Secondary | ICD-10-CM | POA: Diagnosis not present

## 2021-08-20 NOTE — Progress Notes (Signed)
Radiation Oncology ?Follow up Note ? ?Name: Kara [name]   ?Date:   08/20/2021 ?MRN:  [id number] ?DOB: August 03, 1950  ? ? ?This 71 y.o. female presents to the clinic today for 19-month follow-up status post SBRT to right upper lobe for non-small cell lung cancer. ? ?REFERRING PROVIDER: Tracie Harrier, MD ? ?HPI: Patient is a 71 year old female now at 3 months having completed SBRT to her right upper lobe for stage I non-small cell lung cancer treated with SBRT seen today in routine follow-up from a lung standpoint she is doing fairly well she had COVID about a month and a half ago still is quite fatigued from that no cough hemoptysis chest tightness or dysphagia..  She had a CT scan this month showing patchy airspace opacities in the region of prior pulmonary nodule securing the original nodule representing radiation changes.  No progressive disease noted. ? ?COMPLICATIONS OF TREATMENT: none ? ?FOLLOW UP COMPLIANCE: keeps appointments  ? ?PHYSICAL EXAM:  ?BP (!) 131/58 (BP Location: Left Arm, Patient Position: Sitting)   Pulse 94   Temp (!) 97.5 ?F (36.4 ?C) (Tympanic)   Resp 16   Ht 5\' 3"  (1.6 m)   Wt 212 lb 8 oz (96.4 kg)   BMI 37.64 kg/m?  ?Well-developed well-nourished patient in NAD. HEENT reveals PERLA, EOMI, discs not visualized.  Oral cavity is clear. No oral mucosal lesions are identified. Neck is clear without evidence of cervical or supraclavicular adenopathy. Lungs are clear to A&P. Cardiac examination is essentially unremarkable with regular rate and rhythm without murmur rub or thrill. Abdomen is benign with no organomegaly or masses noted. Motor sensory and DTR levels are equal and symmetric in the upper and lower extremities. Cranial nerves II through XII are grossly intact. Proprioception is intact. No peripheral adenopathy or edema is identified. No motor or sensory levels are noted. Crude visual fields are within normal range. ? ?RADIOLOGY RESULTS: CT scan reviewed compatible with  above-stated findings ? ?PLAN: Present time patient is doing well from an SBRT standpoint have assured her the fatigue from [place] is experienced by many people.  She will eventually recover from that.  She has a very low side effect profile from her SBRT.  She has a repeat CT scan in July and I will see her shortly after at follow-up.  Patient knows to call with any concerns. ? ?I would like to take this opportunity to thank you for allowing me to participate in the care of your patient.. ?  ? Noreene Filbert, MD ? ?

## 2021-09-01 ENCOUNTER — Encounter: Payer: Self-pay | Admitting: Pain Medicine

## 2021-09-01 ENCOUNTER — Other Ambulatory Visit: Payer: Self-pay

## 2021-09-01 ENCOUNTER — Ambulatory Visit
Admission: RE | Admit: 2021-09-01 | Discharge: 2021-09-01 | Disposition: A | Discharge status: Home / Self Care | Payer: PPO | Source: Ambulatory Visit | Attending: Pain Medicine | Admitting: Pain Medicine

## 2021-09-01 ENCOUNTER — Ambulatory Visit (HOSPITAL_BASED_OUTPATIENT_CLINIC_OR_DEPARTMENT_OTHER): Payer: PPO | Admitting: Pain Medicine

## 2021-09-01 VITALS — BP 116/68 | HR 93 | Temp 97.2°F | Resp 16 | Ht 63.0 in | Wt 201.0 lb

## 2021-09-01 DIAGNOSIS — M25552 Pain in left hip: Secondary | ICD-10-CM | POA: Diagnosis not present

## 2021-09-01 DIAGNOSIS — M5137 Other intervertebral disc degeneration, lumbosacral region: Secondary | ICD-10-CM | POA: Diagnosis not present

## 2021-09-01 DIAGNOSIS — G8929 Other chronic pain: Secondary | ICD-10-CM

## 2021-09-01 DIAGNOSIS — M1611 Unilateral primary osteoarthritis, right hip: Secondary | ICD-10-CM | POA: Insufficient documentation

## 2021-09-01 DIAGNOSIS — G894 Chronic pain syndrome: Secondary | ICD-10-CM | POA: Diagnosis not present

## 2021-09-01 DIAGNOSIS — M545 Low back pain, unspecified: Secondary | ICD-10-CM | POA: Diagnosis not present

## 2021-09-01 DIAGNOSIS — M47816 Spondylosis without myelopathy or radiculopathy, lumbar region: Secondary | ICD-10-CM | POA: Insufficient documentation

## 2021-09-01 DIAGNOSIS — M79605 Pain in left leg: Secondary | ICD-10-CM | POA: Insufficient documentation

## 2021-09-01 DIAGNOSIS — Z7901 Long term (current) use of anticoagulants: Secondary | ICD-10-CM | POA: Insufficient documentation

## 2021-09-01 DIAGNOSIS — M79604 Pain in right leg: Secondary | ICD-10-CM | POA: Diagnosis not present

## 2021-09-01 DIAGNOSIS — M25551 Pain in right hip: Secondary | ICD-10-CM | POA: Diagnosis not present

## 2021-09-01 DIAGNOSIS — M4306 Spondylolysis, lumbar region: Secondary | ICD-10-CM | POA: Diagnosis not present

## 2021-09-01 DIAGNOSIS — M4310 Spondylolisthesis, site unspecified: Secondary | ICD-10-CM | POA: Diagnosis not present

## 2021-09-01 DIAGNOSIS — M160 Bilateral primary osteoarthritis of hip: Secondary | ICD-10-CM | POA: Diagnosis not present

## 2021-09-01 NOTE — Patient Instructions (Addendum)
______________________________________________________________________ ? ?Preparing for your procedure (without sedation) ? ?Procedure appointments are limited to planned procedures: ?No Prescription Refills. ?No disability issues will be discussed. ?No medication changes will be discussed. ? ?Instructions: ?Oral Intake: Do not eat or drink anything for at least 6 hours prior to your procedure. (Exception: Blood Pressure Medication. See below.) ?Transportation: Unless otherwise stated by your physician, you may drive yourself after the procedure. ?Blood Pressure Medicine: Do not forget to take your blood pressure medicine with a sip of water the morning of the procedure. If your Diastolic (lower reading)is above 100 mmHg, elective cases will be cancelled/rescheduled. ?Blood thinners: These will need to be stopped for procedures. Notify our staff if you are taking any blood thinners. Depending on which one you take, there will be specific instructions on how and when to stop it. ?Diabetics on insulin: Notify the staff so that you can be scheduled 1st case in the morning. If your diabetes requires high dose insulin, take only ? of your normal insulin dose the morning of the procedure and notify the staff that you have done so. ?Preventing infections: Shower with an antibacterial soap the morning of your procedure.  ?Build-up your immune system: Take 1000 mg of Vitamin C with every meal (3 times a day) the day prior to your procedure. ?Antibiotics: Inform the staff if you have a condition or reason that requires you to take antibiotics before dental procedures. ?Pregnancy: If you are pregnant, call and cancel the procedure. ?Sickness: If you have a cold, fever, or any active infections, call and cancel the procedure. ?Arrival: You must be in the facility at least 30 minutes prior to your scheduled procedure. ?Children: Do not bring any children with you. ?Dress appropriately: Bring dark clothing that you would not mind  if they get stained. ?Valuables: Do not bring any jewelry or valuables. ? ?Reasons to call and reschedule or cancel your procedure: (Following these recommendations will minimize the risk of a serious complication.) ?Surgeries: Avoid having procedures within 2 weeks of any surgery. (Avoid for 2 weeks before or after any surgery). ?Flu Shots: Avoid having procedures within 2 weeks of a flu shots or . (Avoid for 2 weeks before or after immunizations). ?Barium: Avoid having a procedure within 7-10 days after having had a radiological study involving the use of radiological contrast. (Myelograms, Barium swallow or enema study). ?Heart attacks: Avoid any elective procedures or surgeries for the initial 6 months after a "Myocardial Infarction" (Heart Attack). ?Blood thinners: It is imperative that you stop these medications before procedures. Let us know if you if you take any blood thinner.  ?Infection: Avoid procedures during or within two weeks of an infection (including chest colds or gastrointestinal problems). Symptoms associated with infections include: Localized redness, fever, chills, night sweats or profuse sweating, burning sensation when voiding, cough, congestion, stuffiness, runny nose, sore throat, diarrhea, nausea, vomiting, cold or Flu symptoms, recent or current infections. It is specially important if the infection is over the area that we intend to treat. ?Heart and lung problems: Symptoms that may suggest an active cardiopulmonary problem include: cough, chest pain, breathing difficulties or shortness of breath, dizziness, ankle swelling, uncontrolled high or unusually low blood pressure, and/or palpitations. If you are experiencing any of these symptoms, cancel your procedure and contact your primary care physician for an evaluation. ? ?Remember:  ?Regular Business hours are:  ?Monday to Thursday 8:00 AM to 4:00 PM ? ?Provider's Schedule: ?Milinda Pointer, MD:  ?Procedure days: Tuesday and Thursday  7:30 AM to 4:00 PM ? ?Gillis Santa, MD:  ?Procedure days: Monday and Wednesday 7:30 AM to 4:00 PM ?______________________________________________________________________ ? ____________________________________________________________________________________________ ? ?General Risks and Possible Complications ? ?Patient Responsibilities: It is important that you read this as it is part of your informed consent. It is our duty to inform you of the risks and possible complications associated with treatments offered to you. It is your responsibility as a patient to read this and to ask questions about anything that is not clear or that you believe was not covered in this document. ? ?Patient?s Rights: You have the right to refuse treatment. You also have the right to change your mind, even after initially having agreed to have the treatment done. However, under this last option, if you wait until the last second to change your mind, you may be charged for the materials used up to that point. ? ?Introduction: Medicine is not an Chief Strategy Officer. Everything in Medicine, including the lack of treatment(s), carries the potential for danger, harm, or loss (which is by definition: Risk). In Medicine, a complication is a secondary problem, condition, or disease that can aggravate an already existing one. All treatments carry the risk of possible complications. The fact that a side effects or complications occurs, does not imply that the treatment was conducted incorrectly. It must be clearly understood that these can happen even when everything is done following the highest safety standards. ? ?No treatment: You can choose not to proceed with the proposed treatment alternative. The ?PRO(s)? would include: avoiding the risk of complications associated with the therapy. The ?CON(s)? would include: not getting any of the treatment benefits. These benefits fall under one of three categories: diagnostic; therapeutic; and/or palliative.  Diagnostic benefits include: getting information which can ultimately lead to improvement of the disease or symptom(s). Therapeutic benefits are those associated with the successful treatment of the disease. Finally, palliative benefits are those related to the decrease of the primary symptoms, without necessarily curing the condition (example: decreasing the pain from a flare-up of a chronic condition, such as incurable terminal cancer). ? ?General Risks and Complications: These are associated to most interventional treatments. They can occur alone, or in combination. They fall under one of the following six (6) categories: no benefit or worsening of symptoms; bleeding; infection; nerve damage; allergic reactions; and/or death. ?No benefits or worsening of symptoms: In Medicine there are no guarantees, only probabilities. No healthcare provider can ever guarantee that a medical treatment will work, they can only state the probability that it may. Furthermore, there is always the possibility that the condition may worsen, either directly, or indirectly, as a consequence of the treatment. ?Bleeding: This is more common if the patient is taking a blood thinner, either prescription or over the counter (example: Goody Powders, Fish oil, Aspirin, Garlic, etc.), or if suffering a condition associated with impaired coagulation (example: Hemophilia, cirrhosis of the liver, low platelet counts, etc.). However, even if you do not have one on these, it can still happen. If you have any of these conditions, or take one of these drugs, make sure to notify your treating physician. ?Infection: This is more common in patients with a compromised immune system, either due to disease (example: diabetes, cancer, human immunodeficiency virus [HIV], etc.), or due to medications or treatments (example: therapies used to treat cancer and rheumatological diseases). However, even if you do not have one on these, it can still happen. If you  have any of these conditions, or take  one of these drugs, make sure to notify your treating physician. ?Nerve Damage: This is more common when the treatment is an invasive one, but it can also happe

## 2021-09-01 NOTE — Progress Notes (Signed)
PROVIDER NOTE: Information contained herein reflects review and annotations entered in association with encounter. Interpretation of such information and data should be left to medically-trained personnel. Information provided to patient can be located elsewhere in the medical record under "Patient Instructions". Document created using STT-dictation technology, any transcriptional errors that may result from process are unintentional.  ?  ?Patient: Kara [name]  Service Category: E/M  Provider: Gaspar Cola, MD  ?DOB: 08/10/1950  DOS: 09/01/2021  Specialty: Interventional Pain Management  ?MRN: [id number]  Setting: Ambulatory outpatient  PCP: Kara Harrier, MD  ?Type: Established Patient    Referring Provider: Tracie Harrier, MD  ?Location: Office  Delivery: Face-to-face    ? ?HPI  ?[name], a 71 y.o. year old female, is here today because of her Chronic bilateral low back pain without sciatica [M54.50, G89.29]. [name] primary complain today is Back Pain (Lumbar bilateral ) ?Last encounter: My last encounter with her was on 07/21/2021. ?Pertinent problems: 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); Lumbar pars defect (L3 and L4) (Bilateral); Diabetic peripheral neuropathy ([place]); Chronic lower extremity pain (2ry area of Pain) (Bilateral); Chronic radicular pain of lower extremity; Osteoarthritis of hip (Right); Chronic low back pain (1ry area of Pain)  (Bilateral) w/o sciatica; Spondylosis without myelopathy or radiculopathy, lumbosacral region; Other specified dorsopathies, sacral and sacrococcygeal region; Secondary osteoarthritis of multiple sites; Sacral insufficiency fracture; DDD (degenerative disc disease), lumbosacral; Coccygodynia; Chronic sacroiliac joint pain (Left); Osteoarthritis of lumbar spine; Spondylolisthesis, lumbosacral region; Abnormal MRI, lumbar spine (08/09/2019); Spasm of muscle of lower back; and Chronic hip pain (Bilateral) (R>L) on their pertinent problem list. ?Pain Assessment: Severity of Chronic pain is reported as a 6 /10. Location: Back Lower, Left, Right/into both hips. Onset: More than a month ago. Quality: Discomfort, Constant, Dull, Sharp. Timing: Constant. Modifying factor(s): rest.. ?Vitals:  height is 5\' 3"  (1.6 m) and weight is 201 lb (91.2 kg). Her temporal temperature is 97.2 ?F (36.2 ?C) (abnormal). Her blood pressure is 116/68 and her pulse is 93. Her respiration is 16 and oxygen saturation is 96%.  ? ?Reason for encounter: post-procedure evaluation and assessment.  The patient returns to the clinic today for follow-up after radiofrequency ablation of the lumbar facets, bilaterally.  The first side done was the right on the left was second.  She is having quite a bit of pain in the right buttocks area but upon further physical exam today it is clear that this pain is coming from her right hip joint area.  Patrick maneuver was exquisitely positive for pain coming from the right hip.  She was unable to provide any kind of range of motion without having exquisite pain.  At this point what I will be doing is ordering this patient to have x-rays of both of her hips and I will bring her in for a diagnostic right intra-articular hip joint injection under fluoroscopic guidance.  Regarding the facet joints, despite the fact that she is having pain today, it is not coming from the facet joints and therefore it would seem that  radiofrequency ablation was indeed  successful in controlling this particular area pain. ? ?Post-procedure evaluation  ? Type: Lumbar Facet, Medial Branch Radiofrequency Ablation (RFA) #1  ?Laterality: Left  ?Level: L2, L3, L4, L5, & S1 Medial Branch Level(s). These levels will denervate the L3-4, L4-5 and L5-S1 lumbar facet joints.  ?Imaging: Fluoroscopic guidance ?Anesthesia: Local anesthesia (1-2% Lidocaine) ?Anxiolysis: IV Versed         ?Sedation: Moderate conscious sedation. ?DOS: 07/21/2021  ?Performed by: Gaspar Cola, MD ? ?Purpose: Therapeutic/Palliative ?Indications: Low back pain severe enough to impact quality of life or function. ? ?Pain Score: ?Pre-procedure: 8 /10 ?Post-procedure: 2 /10 ? ?   ?Effectiveness:  ?Initial hour after procedure: 100 %. ?Subsequent 4-6 hours post-procedure: 100 %. ?Analgesia past initial 6 hours: 100 % (pain relief x 1 week and then pain returned gradually). ?Ongoing improvement:  ?Analgesic: The patient refers that the pain has began to come back and it is especially bad on the right side.  However, upon further examining today, her pain today is localized to her right buttocks area and physical exam reveals that this pain is coming from her hip joint.  She did not seem to be having any problems with the facet joints at this point. ?Function: [name] reports improvement in function ?ROM: [name] reports improvement in ROM ? ?Pharmacotherapy Assessment  ?Analgesic: Oxycodone IR 10 mg, 1 tab PO qd,  from PCP. No chronic opioid analgesics therapy prescribed by our practice, 2ry to Medication Agreement Violation. (Getting oxycodone from PCP in multiple occasions, according to PMP)(11/21/20; 12/26/20; 01/16/21; 02/13/21). Opioid analgesic pharmacotherapy D/C'ed on 03/03/2021. ?MME: 20 mg/day.  ? ?Monitoring: ?[place] PMP: PDMP reviewed during this encounter.       ?Pharmacotherapy: No side-effects or adverse reactions reported. ?Compliance: No problems  identified. ?Effectiveness: Clinically acceptable. ? ?Janett Billow, RN  09/01/2021  1:54 PM  Sign when Signing Visit ?Safety precautions to be maintained throughout the outpatient stay will include: orient to surroundings, keep bed in low position, maintain call bell within reach at all times, provide assistance with transfer out of bed and ambulation.  ?   UDS:  ?Summary  ?Date Value Ref Range Status  ?11/12/2020 Note  Final  ?  Comment:  ?  ==================================================================== ?[facility] (MW) ?==================================================================== ?Test                             Result       Flag       Units ? ?Drug Present and Declared for Prescription Verification ?  Norhydrocodone                 61           EXPECTED   ng/mg creat ?   Norhydrocodone is an expected metabolite of hydrocodone. ? ?  Tramadol                       >4202        EXPECTED   ng/mg creat ?  O-Desmethyltramadol            >4202        EXPECTED   ng/mg creat ?  N-Desmethyltramadol            1712         EXPECTED   ng/mg creat ?   Source of tramadol is a prescription medication. O-desmethyltramadol ?   and N-desmethyltramadol are expected metabolites of  tramadol. ? ?Drug Absent but Declared for Prescription Verification ?  Hydrocodone                    Not Detected UNEXPECTED ng/mg creat ?   Hydrocodone is almost always present in patients taking this drug ?   consistently. Absence of hydrocodone could be due to lapse of time ?   since the last dose or unusual pharmacokinetics (rapid metabolism). ? ?==================================================================== ?Test                      Result    Flag   Units      Ref Range ?  Creatinine              119              mg/dL      >=20 ?==================================================================== ?Declared Medications: ? The flagging and interpretation on this report are based on the ? following declared  medications.  Unexpected results may arise from ? inaccuracies in the declared medications. ? ? **Note: The testing scope of this panel includes these medications: ? ? Hydrocodone ? Tramadol ? ? **Note: The testing scope of this pane

## 2021-09-01 NOTE — Progress Notes (Signed)
Safety precautions to be maintained throughout the outpatient stay will include: orient to surroundings, keep bed in low position, maintain call bell within reach at all times, provide assistance with transfer out of bed and ambulation.  

## 2021-09-02 ENCOUNTER — Telehealth: Payer: Self-pay | Admitting: Pain Medicine

## 2021-09-02 NOTE — Telephone Encounter (Signed)
Called patient. No answer. LVM. ?

## 2021-09-02 NOTE — Telephone Encounter (Signed)
Patient called for xray results. Please give PT a call. Thanks ?

## 2021-09-09 DIAGNOSIS — R791 Abnormal coagulation profile: Secondary | ICD-10-CM | POA: Diagnosis not present

## 2021-09-09 DIAGNOSIS — Z86711 Personal history of pulmonary embolism: Secondary | ICD-10-CM | POA: Diagnosis not present

## 2021-09-10 DIAGNOSIS — G8929 Other chronic pain: Secondary | ICD-10-CM | POA: Diagnosis not present

## 2021-09-10 DIAGNOSIS — M25511 Pain in right shoulder: Secondary | ICD-10-CM | POA: Diagnosis not present

## 2021-09-10 DIAGNOSIS — I10 Essential (primary) hypertension: Secondary | ICD-10-CM | POA: Diagnosis not present

## 2021-09-10 DIAGNOSIS — M1611 Unilateral primary osteoarthritis, right hip: Secondary | ICD-10-CM | POA: Diagnosis not present

## 2021-09-10 DIAGNOSIS — E1165 Type 2 diabetes mellitus with hyperglycemia: Secondary | ICD-10-CM | POA: Diagnosis not present

## 2021-09-13 NOTE — Progress Notes (Signed)
PROVIDER NOTE: Interpretation of information contained herein should be left to medically-trained personnel. Specific patient instructions are provided elsewhere under "Patient Instructions" section of medical record. This document was created in part using STT-dictation technology, any transcriptional errors that may result from this process are unintentional.  ?Patient: Kara [name] ?Type: Established ?DOB: March 05, 1951 ?MRN: [id number] ?PCP: Tracie Harrier, MD  Service: Procedure ?DOS: 09/15/2021 ?Setting: Ambulatory ?Location: Ambulatory outpatient facility ?Delivery: Face-to-face Provider: Gaspar Cola, MD ?Specialty: Interventional Pain Management ?Specialty designation: 09 ?Location: Outpatient facility ?Ref. Prov.: Tracie Harrier, MD   ? ?Primary Reason for Visit: Interventional Pain Management Treatment. ?CC: Hip Pain (right) ? ?  ?Procedure #1:  Anesthesia, Analgesia, Anxiolysis:  ?Type: Intra-Articular Hip Injection #1  ?Primary Purpose: Diagnostic ?Region: Posterolateral hip joint area. ?Level: Lower pelvic and hip joint level. ?Target Area: Superior aspect of the hip joint cavity, going thru the superior portion of the capsular ligament. ?Approach: Posterolateral approach. ?Laterality: Right  Anesthesia: Local (1-2% Lidocaine)  ?Anxiolysis: None  ?Sedation: None  ?Guidance: Fluoroscopy         ? ? ?Position: Lateral Decubitus with bad side up ?Area Prepped: Entire Posterolateral hip area. ?DuraPrep (Iodine Povacrylex [0.7% available iodine] and Isopropyl Alcohol, 74% w/w)  ? ?Procedure #2:    ?Type: Trochanteric Bursa Injection #1  ?Primary Purpose: Diagnostic ?Region: Upper (proximal) Femoral Region ?Level: Hip Joint ?Target Area: Superior aspect of the hip joint cavity, going thru the superior portion of the capsular ligament. ?Approach: Posterolateral approach ?Laterality: Right    ? ?1. Chronic hip pain (Right)   ?2. Osteoarthritis of hip (Right)   ?3. Greater trochanteric bursitis  (Right)   ?4. Osteoarthritis of hips (Bilateral)   ? ?NAS-11 Pain score:  ? Pre-procedure: 9 /10  ? Post-procedure: 9 /10  ? ?  ?Pre-op H&P Assessment:  ?Kara [name] is a 71 y.o. (year old), female patient, seen today for interventional treatment. She  has a past surgical history that includes Knee surgery; Carpal tunnel release (Bilateral); Colon resection sigmoid (N/A, 11/02/2016); Colostomy (N/A, 11/02/2016); Sigmoidoscopy (N/A, 11/13/2016); PULMONARY VENOGRAPHY (N/A, 11/19/2016); Colonoscopy with propofol (N/A, 02/03/2017); Colon surgery (11/02/2016); IVC FILTER INSERTION (Right, 10/2016); Colostomy reversal (N/A, 02/24/2017); Appendectomy (N/A, 02/24/2017); Lysis of adhesion (N/A, 02/24/2017); laparoscopy (N/A, 02/24/2017); Ileo loop diversion (N/A, 02/24/2017); Sigmoidoscopy (N/A, 05/19/2017); Ileostomy closure (N/A, 06/01/2017); IVC FILTER REMOVAL (N/A, 08/09/2017); Sacroplasty (N/A, 11/08/2017); Cataract extraction w/PHACO (Left, 11/21/2018); Cataract extraction w/PHACO (Right, 12/12/2018); and Video bronchoscopy with endobronchial navigation (N/A, 02/23/2021). Kara [name] has a current medication list which includes the following prescription(s): alendronate, calcium carbonate, candesartan, cholecalciferol, vitamin b-12, cyclobenzaprine, dulaglutide, gabapentin, gabapentin, glipizide, glipizide, meclizine, multivitamin with minerals, oxycodone hcl, ropinirole, tolterodine, tramadol, warfarin, warfarin, hydrocortisone, and ibuprofen, and the following Facility-Administered Medications: iohexol, lidocaine, methylprednisolone acetate, pentafluoroprop-tetrafluoroeth, and ropivacaine (pf) 2 mg/ml (0.2%). Her primarily concern today is the Hip Pain (right) ? ?Initial Vital Signs:  ?Pulse/HCG Rate: 85ECG Heart Rate: 82 ?Temp: 97.7 ?F (36.5 ?C) ?Resp: 16 ?BP: (!) 130/58 ?SpO2: 99 % ? ?BMI: Estimated body mass index is 35.61 kg/m? as calculated from the following: ?  Height as of this encounter: 5\' 3"  (1.6 m). ?  Weight as of this  encounter: 201 lb (91.2 kg). ? ?Risk Assessment: ?Allergies: Reviewed. She is allergic to adhesive [tape], morphine, other, latex, and morphine and related.  ?Allergy Precautions: None required ?Coagulopathies: Reviewed. None identified.  ?Blood-thinner therapy: None at this time ?Active Infection(s): Reviewed. None identified. Kara [name] is afebrile ? ?Site Confirmation: Kara [name] was asked to  confirm the procedure and laterality before marking the site ?Procedure checklist: Completed ?Consent: Before the procedure and under the influence of no sedative(s), amnesic(s), or anxiolytics, the patient was informed of the treatment options, risks and possible complications. To fulfill our ethical and legal obligations, as recommended by the American Medical Association's Code of Ethics, I have informed the patient of my clinical impression; the nature and purpose of the treatment or procedure; the risks, benefits, and possible complications of the intervention; the alternatives, including doing nothing; the risk(s) and benefit(s) of the alternative treatment(s) or procedure(s); and the risk(s) and benefit(s) of doing nothing. ?The patient was provided information about the general risks and possible complications associated with the procedure. These may include, but are not limited to: failure to achieve desired goals, infection, bleeding, organ or nerve damage, allergic reactions, paralysis, and death. ?In addition, the patient was informed of those risks and complications associated to the procedure, such as failure to decrease pain; infection; bleeding; organ or nerve damage with subsequent damage to sensory, motor, and/or autonomic systems, resulting in permanent pain, numbness, and/or weakness of one or several areas of the body; allergic reactions; (i.e.: anaphylactic reaction); and/or death. ?Furthermore, the patient was informed of those risks and complications associated with the medications. These include,  but are not limited to: allergic reactions (i.e.: anaphylactic or anaphylactoid reaction(s)); adrenal axis suppression; blood sugar elevation that in diabetics may result in ketoacidosis or comma; water retention that in patients with history of congestive heart failure may result in shortness of breath, pulmonary edema, and decompensation with resultant heart failure; weight gain; swelling or edema; medication-induced neural toxicity; particulate matter embolism and blood vessel occlusion with resultant organ, and/or nervous system infarction; and/or aseptic necrosis of one or more joints. ?Finally, the patient was informed that Medicine is not an exact science; therefore, there is also the possibility of unforeseen or unpredictable risks and/or possible complications that may result in a catastrophic outcome. The patient indicated having understood very clearly. We have given the patient no guarantees and we have made no promises. Enough time was given to the patient to ask questions, all of which were answered to the patient's satisfaction. Kara [name] has indicated that she wanted to continue with the procedure. ?Attestation: I, the ordering provider, attest that I have discussed with the patient the benefits, risks, side-effects, alternatives, likelihood of achieving goals, and potential problems during recovery for the procedure that I have provided informed consent. ?Date  Time: 09/15/2021 11:25 AM ? ?Pre-Procedure Preparation:  ?Monitoring: As per clinic protocol. Respiration, ETCO2, SpO2, BP, heart rate and rhythm monitor placed and checked for adequate function ?Safety Precautions: Patient was assessed for positional comfort and pressure points before starting the procedure. ?Time-out: I initiated and conducted the "Time-out" before starting the procedure, as per protocol. The patient was asked to participate by confirming the accuracy of the "Time Out" information. Verification of the correct person,  site, and procedure were performed and confirmed by me, the nursing staff, and the patient. "Time-out" conducted as per Joint Commission's Universal Protocol (UP.01.01.01). ?Time:   ? ?Description of Pr

## 2021-09-15 ENCOUNTER — Encounter: Payer: Self-pay | Admitting: Pain Medicine

## 2021-09-15 ENCOUNTER — Ambulatory Visit (HOSPITAL_BASED_OUTPATIENT_CLINIC_OR_DEPARTMENT_OTHER): Payer: PPO | Admitting: Pain Medicine

## 2021-09-15 ENCOUNTER — Ambulatory Visit
Admission: RE | Admit: 2021-09-15 | Discharge: 2021-09-15 | Disposition: A | Discharge status: Home / Self Care | Payer: PPO | Source: Ambulatory Visit | Attending: Pain Medicine | Admitting: Pain Medicine

## 2021-09-15 VITALS — BP 150/90 | HR 85 | Temp 97.7°F | Resp 28 | Ht 63.0 in | Wt 201.0 lb

## 2021-09-15 DIAGNOSIS — M1611 Unilateral primary osteoarthritis, right hip: Secondary | ICD-10-CM | POA: Insufficient documentation

## 2021-09-15 DIAGNOSIS — M7061 Trochanteric bursitis, right hip: Secondary | ICD-10-CM | POA: Insufficient documentation

## 2021-09-15 DIAGNOSIS — M160 Bilateral primary osteoarthritis of hip: Secondary | ICD-10-CM | POA: Insufficient documentation

## 2021-09-15 DIAGNOSIS — M25551 Pain in right hip: Secondary | ICD-10-CM

## 2021-09-15 DIAGNOSIS — G8929 Other chronic pain: Secondary | ICD-10-CM | POA: Insufficient documentation

## 2021-09-15 MED ORDER — IOHEXOL 180 MG/ML  SOLN
10.0000 mL | Freq: Once | INTRAMUSCULAR | Status: AC
  Administered 2021-09-15: 5 mL via INTRA_ARTICULAR

## 2021-09-15 MED ORDER — METHYLPREDNISOLONE ACETATE 80 MG/ML IJ SUSP
80.0000 mg | Freq: Once | INTRAMUSCULAR | Status: AC
  Administered 2021-09-15: 80 mg via INTRA_ARTICULAR

## 2021-09-15 MED ORDER — LIDOCAINE HCL 2 % IJ SOLN
20.0000 mL | Freq: Once | INTRAMUSCULAR | Status: AC
  Administered 2021-09-15: 400 mg

## 2021-09-15 MED ORDER — PENTAFLUOROPROP-TETRAFLUOROETH EX AERO: INHALATION_SPRAY | Freq: Once | CUTANEOUS | Status: DC

## 2021-09-15 MED ORDER — ROPIVACAINE HCL 2 MG/ML IJ SOLN
INTRAMUSCULAR | Status: AC
  Filled 2021-09-15: qty 20

## 2021-09-15 MED ORDER — LIDOCAINE HCL 2 % IJ SOLN
INTRAMUSCULAR | Status: AC
  Filled 2021-09-15: qty 20

## 2021-09-15 MED ORDER — METHYLPREDNISOLONE ACETATE 80 MG/ML IJ SUSP
INTRAMUSCULAR | Status: AC
  Filled 2021-09-15: qty 1

## 2021-09-15 MED ORDER — ROPIVACAINE HCL 2 MG/ML IJ SOLN
9.0000 mL | Freq: Once | INTRAMUSCULAR | Status: AC
  Administered 2021-09-15: 9 mL via INTRA_ARTICULAR

## 2021-09-15 NOTE — Progress Notes (Signed)
Safety precautions to be maintained throughout the outpatient stay will include: orient to surroundings, keep bed in low position, maintain call bell within reach at all times, provide assistance with transfer out of bed and ambulation.  

## 2021-09-15 NOTE — Patient Instructions (Signed)

## 2021-09-16 ENCOUNTER — Telehealth: Payer: Self-pay | Admitting: *Deleted

## 2021-09-16 NOTE — Telephone Encounter (Signed)
No problems post procedure. 

## 2021-09-27 NOTE — Progress Notes (Signed)
Unsuccessful attempt to contact patient for Virtual Visit (Pain Management Telehealth)  ? ?Patient provided contact information:  ?[phone] (home); [phone] (mobile); (Preferred) [phone] ?[email or web address]  ? ?Pre-screening:  ?Our staff was successful in contacting Kara [name] using the above provided information.  ? ?I unsuccessfully attempted to make contact with Kara [name] on 2 different locations on 09/29/2021 via telephone. I was unable to complete the virtual encounter due to call going directly to voicemail. I was able to leave a message. ? ?The information below was obtained by the nursing staff during the precharting evaluation. ? ?Post-procedure evaluation  ?  ?Procedure #1:  Anesthesia, Analgesia, Anxiolysis:  ?Type: Intra-Articular Hip Injection #1  ?Primary Purpose: Diagnostic ?Region: Posterolateral hip joint area. ?Level: Lower pelvic and hip joint level. ?Target Area: Superior aspect of the hip joint cavity, going thru the superior portion of the capsular ligament. ?Approach: Posterolateral approach. ?Laterality: Right  Anesthesia: Local (1-2% Lidocaine)  ?Anxiolysis: None  ?Sedation: None  ?Guidance: Fluoroscopy         ? ? ?Position: Lateral Decubitus with bad side up ?Area Prepped: Entire Posterolateral hip area. ?DuraPrep (Iodine Povacrylex [0.7% available iodine] and Isopropyl Alcohol, 74% w/w)  ? ?Procedure #2:    ?Type: Trochanteric Bursa Injection #1  ?Primary Purpose: Diagnostic ?Region: Upper (proximal) Femoral Region ?Level: Hip Joint ?Target Area: Superior aspect of the hip joint cavity, going thru the superior portion of the capsular ligament. ?Approach: Posterolateral approach ?Laterality: Right    ? ?1. Chronic hip pain (Right)   ?2. Osteoarthritis of hip (Right)   ?3. Greater trochanteric bursitis (Right)   ?4. Osteoarthritis of hips (Bilateral)   ? ?NAS-11 Pain score:  ? Pre-procedure: 9 /10  ? Post-procedure: 9 /10  ? ?   ?Effectiveness:  ?Initial hour after  procedure: 85 %. ?Subsequent 4-6 hours post-procedure: 85 %. ?Analgesia past initial 6 hours: 85 %. ? ?Pharmacotherapy Assessment  ?Analgesic: Oxycodone IR 10 mg, 1 tab PO qd,  from PCP. No chronic opioid analgesics therapy prescribed by our practice, 2ry to Medication Agreement Violation. (Getting oxycodone from PCP in multiple occasions, according to PMP)(11/21/20; 12/26/20; 01/16/21; 02/13/21). Opioid analgesic pharmacotherapy D/C'ed on 03/03/2021. ?MME: 20 mg/day.  ? ?Follow-up plan:   ?Reschedule Visit.  ?  Interventional Therapies  ?Risk  Complexity Considerations:   ?Estimated body mass index is 37.45 kg/m? as calculated from the following: ?  Height as of 05/05/21: 5\' 3"  (1.6 m). ?  Weight as of 05/13/21: 211 lb 6.4 oz (95.9 kg). ?1.  Coumadin anticoagulation  (Stop: 5 days  Re-start: 2hrs) ?2.  Latex allergy  ?3.  Morbid obesity (class III) (BMI>40)  ?4.  No Lumbar RFA due to morbid obesity   ? ?Planned  Pending:   ?  ? ?Under consideration:   ?  ? ?Completed:   ?Diagnostic/therapeutic right IA hip injection x1 (09/15/2021) (85/85/85)  ?Diagnostic/therapeutic right trochanteric bursa injection x1 (09/15/2021) (85/85/85)  ?Therapeutic left lumbar facet RFA x1 (07/21/2021) (100/100/100 x1 week/80)  ?Therapeutic right lumbar facet RFA x1 (07/07/2021) (100/100/80/80)  ?Palliative/Therapeutic left L4-5 LESI x1 (11/10/2017) (100/80/0/0-25)  ?Palliative left lumbar facet MBB x17 (05/05/2021) (100/100/75/85)  ?Palliative right lumbar facet MBB x23 (05/05/2021) (100/100/75/85)  ?Palliative right SI joint block x8 (not effective) (03/08/2019) (100/100/100/0)  ?Diagnostic left SI joint block x1 (not effective) (03/08/2019) (100/100/100/0)  ?Palliative/Therapeutic (Midline) caudal ESI x3 (06/08/2018) (100/100/100 x 2-3 days/0)   ? ?Therapeutic  Palliative (PRN) options:   ?Palliative lumbar facet MBB   ?   ?Recent Visits ?  Date Type Provider Dept  ?09/15/21 Procedure visit Milinda Pointer, MD [name]   ?09/01/21 Office Visit Milinda Pointer, MD [name]  ?07/21/21 Procedure visit Milinda Pointer, MD [name]  ?07/07/21 Procedure visit Milinda Pointer, MD [name]  ?Showing recent visits within past 90 days and meeting all other requirements ?Today's Visits ?Date Type Provider Dept  ?09/29/21 Office Visit Milinda Pointer, MD [name]  ?Showing today's visits and meeting all other requirements ?Future Appointments ?No visits were found meeting these conditions. ?Showing future appointments within next 90 days and meeting all other requirements ? ? ?Note by: Gaspar Cola, MD ?Date: 09/29/2021; Time: 3:31 PM ?

## 2021-09-29 ENCOUNTER — Ambulatory Visit: Payer: PPO | Attending: Pain Medicine | Admitting: Pain Medicine

## 2021-09-29 DIAGNOSIS — M1611 Unilateral primary osteoarthritis, right hip: Secondary | ICD-10-CM

## 2021-09-29 DIAGNOSIS — M7061 Trochanteric bursitis, right hip: Secondary | ICD-10-CM

## 2021-09-29 DIAGNOSIS — M79605 Pain in left leg: Secondary | ICD-10-CM

## 2021-09-29 DIAGNOSIS — M25551 Pain in right hip: Secondary | ICD-10-CM

## 2021-09-29 DIAGNOSIS — R791 Abnormal coagulation profile: Secondary | ICD-10-CM | POA: Diagnosis not present

## 2021-09-29 DIAGNOSIS — M79604 Pain in right leg: Secondary | ICD-10-CM

## 2021-09-29 DIAGNOSIS — M545 Low back pain, unspecified: Secondary | ICD-10-CM

## 2021-09-29 DIAGNOSIS — M4310 Spondylolisthesis, site unspecified: Secondary | ICD-10-CM

## 2021-09-29 DIAGNOSIS — Z86711 Personal history of pulmonary embolism: Secondary | ICD-10-CM | POA: Diagnosis not present

## 2021-09-29 DIAGNOSIS — G8929 Other chronic pain: Secondary | ICD-10-CM

## 2021-10-27 DIAGNOSIS — E1165 Type 2 diabetes mellitus with hyperglycemia: Secondary | ICD-10-CM | POA: Diagnosis not present

## 2021-10-27 DIAGNOSIS — Z7901 Long term (current) use of anticoagulants: Secondary | ICD-10-CM | POA: Diagnosis not present

## 2021-10-27 DIAGNOSIS — R911 Solitary pulmonary nodule: Secondary | ICD-10-CM | POA: Diagnosis not present

## 2021-10-27 DIAGNOSIS — M5136 Other intervertebral disc degeneration, lumbar region: Secondary | ICD-10-CM | POA: Diagnosis not present

## 2021-10-27 DIAGNOSIS — I10 Essential (primary) hypertension: Secondary | ICD-10-CM | POA: Diagnosis not present

## 2021-11-02 DIAGNOSIS — M25511 Pain in right shoulder: Secondary | ICD-10-CM | POA: Diagnosis not present

## 2021-11-02 DIAGNOSIS — I10 Essential (primary) hypertension: Secondary | ICD-10-CM | POA: Diagnosis not present

## 2021-11-02 DIAGNOSIS — Z9181 History of falling: Secondary | ICD-10-CM | POA: Diagnosis not present

## 2021-11-02 DIAGNOSIS — R262 Difficulty in walking, not elsewhere classified: Secondary | ICD-10-CM | POA: Diagnosis not present

## 2021-11-02 DIAGNOSIS — M2550 Pain in unspecified joint: Secondary | ICD-10-CM | POA: Diagnosis not present

## 2021-11-02 DIAGNOSIS — G894 Chronic pain syndrome: Secondary | ICD-10-CM | POA: Diagnosis not present

## 2021-11-02 DIAGNOSIS — Z0000 Encounter for general adult medical examination without abnormal findings: Secondary | ICD-10-CM | POA: Diagnosis not present

## 2021-11-02 DIAGNOSIS — Z7901 Long term (current) use of anticoagulants: Secondary | ICD-10-CM | POA: Diagnosis not present

## 2021-11-02 DIAGNOSIS — G8929 Other chronic pain: Secondary | ICD-10-CM | POA: Diagnosis not present

## 2021-11-02 DIAGNOSIS — R29898 Other symptoms and signs involving the musculoskeletal system: Secondary | ICD-10-CM | POA: Diagnosis not present

## 2021-11-02 DIAGNOSIS — Z5320 Procedure and treatment not carried out because of patient's decision for unspecified reasons: Secondary | ICD-10-CM | POA: Insufficient documentation

## 2021-11-02 DIAGNOSIS — Z6836 Body mass index (BMI) 36.0-36.9, adult: Secondary | ICD-10-CM | POA: Diagnosis not present

## 2021-11-02 DIAGNOSIS — M47817 Spondylosis without myelopathy or radiculopathy, lumbosacral region: Secondary | ICD-10-CM | POA: Diagnosis not present

## 2021-11-04 ENCOUNTER — Ambulatory Visit (HOSPITAL_BASED_OUTPATIENT_CLINIC_OR_DEPARTMENT_OTHER): Payer: PPO | Admitting: Pain Medicine

## 2021-11-04 ENCOUNTER — Ambulatory Visit
Admission: RE | Admit: 2021-11-04 | Discharge: 2021-11-04 | Disposition: A | Discharge status: Home / Self Care | Payer: PPO | Source: Ambulatory Visit | Attending: Pain Medicine | Admitting: Pain Medicine

## 2021-11-04 VITALS — BP 153/74 | HR 81 | Temp 97.5°F | Resp 16 | Ht 63.0 in | Wt 201.0 lb

## 2021-11-04 DIAGNOSIS — M160 Bilateral primary osteoarthritis of hip: Secondary | ICD-10-CM | POA: Diagnosis not present

## 2021-11-04 DIAGNOSIS — M79605 Pain in left leg: Secondary | ICD-10-CM

## 2021-11-04 DIAGNOSIS — M545 Low back pain, unspecified: Secondary | ICD-10-CM | POA: Diagnosis not present

## 2021-11-04 DIAGNOSIS — M47816 Spondylosis without myelopathy or radiculopathy, lumbar region: Secondary | ICD-10-CM

## 2021-11-04 DIAGNOSIS — M1611 Unilateral primary osteoarthritis, right hip: Secondary | ICD-10-CM

## 2021-11-04 DIAGNOSIS — M25551 Pain in right hip: Secondary | ICD-10-CM | POA: Insufficient documentation

## 2021-11-04 DIAGNOSIS — M4310 Spondylolisthesis, site unspecified: Secondary | ICD-10-CM | POA: Insufficient documentation

## 2021-11-04 DIAGNOSIS — G8929 Other chronic pain: Secondary | ICD-10-CM | POA: Diagnosis not present

## 2021-11-04 DIAGNOSIS — Z7901 Long term (current) use of anticoagulants: Secondary | ICD-10-CM | POA: Diagnosis not present

## 2021-11-04 DIAGNOSIS — M79604 Pain in right leg: Secondary | ICD-10-CM | POA: Diagnosis not present

## 2021-11-04 DIAGNOSIS — M7061 Trochanteric bursitis, right hip: Secondary | ICD-10-CM

## 2021-11-04 DIAGNOSIS — G894 Chronic pain syndrome: Secondary | ICD-10-CM | POA: Diagnosis not present

## 2021-11-04 DIAGNOSIS — M4319 Spondylolisthesis, multiple sites in spine: Secondary | ICD-10-CM | POA: Diagnosis not present

## 2021-11-04 NOTE — Patient Instructions (Signed)
______________________________________________________________________  Preparing for your procedure (without sedation)  Procedure appointments are limited to planned procedures: No Prescription Refills. No disability issues will be discussed. No medication changes will be discussed.  Instructions: Food Intake: Avoid eating anything for at least 4 hours prior to your procedure. Transportation: Unless otherwise stated by your physician, bring a driver. Morning Medicines: Take all of your scheduled morning medications. If you take heart medicine, except for blood thinners, do not forget to take it the morning of the procedure. If your Diastolic (lower reading) is above 100 mmHg, elective cases will be cancelled/rescheduled. Blood thinners: These will need to be stopped for procedures. Notify our staff if you are taking any blood thinners. Depending on which one you take, there will be specific instructions on how and when to stop it. Diabetics on insulin: Notify the staff so that you can be scheduled 1st case in the morning. If your diabetes requires high dose insulin, take only  of your normal insulin dose the morning of the procedure and notify the staff that you have done so. Preventing infections: Shower with an antibacterial soap the morning of your procedure.  Build-up your immune system: Take 1000 mg of Vitamin C with every meal (3 times a day) the day prior to your procedure. Antibiotics: Inform the staff if you have a condition or reason that requires you to take antibiotics before dental procedures. Pregnancy: If you are pregnant, call and cancel the procedure. Sickness: If you have a cold, fever, or any active infections, call and cancel the procedure. Arrival: You must be in the facility at least 30 minutes prior to your scheduled procedure. Children: Do not bring any children with you. Dress appropriately: There is always a possibility that your clothing may get soiled. Valuables:  Do not bring any jewelry or valuables.  Reasons to call and reschedule or cancel your procedure: (Following these recommendations will minimize the risk of a serious complication.) Surgeries: Avoid having procedures within 2 weeks of any surgery. (Avoid for 2 weeks before or after any surgery). Flu Shots: Avoid having procedures within 2 weeks of a flu shots or . (Avoid for 2 weeks before or after immunizations). Barium: Avoid having a procedure within 7-10 days after having had a radiological study involving the use of radiological contrast. (Myelograms, Barium swallow or enema study). Heart attacks: Avoid any elective procedures or surgeries for the initial 6 months after a "Myocardial Infarction" (Heart Attack). Blood thinners: It is imperative that you stop these medications before procedures. Let us know if you if you take any blood thinner.  Infection: Avoid procedures during or within two weeks of an infection (including chest colds or gastrointestinal problems). Symptoms associated with infections include: Localized redness, fever, chills, night sweats or profuse sweating, burning sensation when voiding, cough, congestion, stuffiness, runny nose, sore throat, diarrhea, nausea, vomiting, cold or Flu symptoms, recent or current infections. It is specially important if the infection is over the area that we intend to treat. Heart and lung problems: Symptoms that may suggest an active cardiopulmonary problem include: cough, chest pain, breathing difficulties or shortness of breath, dizziness, ankle swelling, uncontrolled high or unusually low blood pressure, and/or palpitations. If you are experiencing any of these symptoms, cancel your procedure and contact your primary care physician for an evaluation.  Remember:  Regular Business hours are:  Monday to Thursday 8:00 AM to 4:00 PM  Provider's Schedule: Milinda Pointer, MD:  Procedure days: Tuesday and Thursday 7:30 AM to 4:00 PM  Gillis Santa, MD:  Procedure days: Monday and Wednesday 7:30 AM to 4:00 PM  Stop Coumadin 5 days before procedure. ______________________________________________________________________

## 2021-11-04 NOTE — Progress Notes (Signed)
PROVIDER NOTE: Information contained herein reflects review and annotations entered in association with encounter. Interpretation of such information and data should be left to medically-trained personnel. Information provided to patient can be located elsewhere in the medical record under "Patient Instructions". Document created using STT-dictation technology, any transcriptional errors that may result from process are unintentional.    Patient: Kara [name]  Service Category: E/M  Provider: Gaspar Cola, MD  DOB: 04/04/1951  DOS: 11/04/2021  Specialty: Interventional Pain Management  MRN: [id number]  Setting: Ambulatory outpatient  PCP: Kara Harrier, MD  Type: Established Patient    Referring Provider: Tracie Harrier, MD  Location: Office  Delivery: Face-to-face     HPI  Kara [name], a 71 y.o. year old female, is here today because of her Chronic hip pain, right [M25.551, G89.29]. Kara [name] primary complain today is Back Pain (lower) Last encounter: My last encounter with her was on 09/29/2021. 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); Lumbar pars defect (L3 and L4) (Bilateral); Diabetic peripheral neuropathy ([place]); Chronic lower extremity pain (2ry area of Pain) (Bilateral); Chronic radicular pain of lower extremity; Osteoarthritis of hip (Right); Chronic low back pain (1ry area of Pain) (Bilateral) w/o sciatica; Spondylosis  without myelopathy or radiculopathy, lumbosacral region; Other specified dorsopathies, sacral and sacrococcygeal region; Secondary osteoarthritis of multiple sites; Sacral insufficiency fracture; DDD (degenerative disc disease), lumbosacral; Coccygodynia; Chronic sacroiliac joint pain (Left); Osteoarthritis of lumbar spine; Spondylolisthesis, lumbosacral region; Abnormal MRI, lumbar spine (08/09/2019); Spasm of muscle of lower back; Chronic hip pain (Bilateral) (R>L); Osteoarthritis of hips (Bilateral); Greater trochanteric bursitis (Right); and Chronic hip pain (Right) on their pertinent problem list. Pain Assessment: Severity of Chronic pain is reported as a 10-Worst pain ever/10. Location: Back Lower/denies. Onset: More than a month ago. Quality: Larence Penning. Timing: Intermittent. Modifying factor(s): rest. Vitals:  height is _0  (1.6 m) and weight is 201 lb (91.2 kg). Her temporal temperature is 97.5 F (36.4 C) (abnormal). Her blood pressure is 153/74 (abnormal) and her pulse is 81. Her respiration is 16 and oxygen saturation is 98%.   Reason for encounter: post-procedure evaluation and assessment.  According to the patient the injection provided her with 100% relief of the pain for the duration of the local anesthetic followed by a decrease to a 65% improvement that only lasted for 2 days.  Today she comes in indicating that her worst pain is in the area of the right buttocks.  Hyperextension and rotation today did show some discomfort associated with her facet disease.  Patrick maneuver was positive on the right side for pain that appears to be coming still from the hip joint.  The patient's description of the pain seems to follow the distribution of an L4 dermatome.  Rotation of the spine seem to aggravate the pain.  Based on the patient's last MRI her L4-5 and L5-S1 anterolisthesis has increased with time.  On the 08/09/2019 MRI they describe that they anterolisthesis between L4 and L5 increased from  5 mm to 7 mm.  Since the patient continues to have pain with  certain movements of the spine, I suspect that there continues to be motion between those vertebral bodies and today I will be ordering an x-ray of the lumbar spine on flexion and extension to the determine if that movement could be considered to be instability of the spine.  At the same time, I will be scheduling the patient to return for a right-sided L4 TFESI.  If during the flexion and extension x-ray of the lumbar spine there is more than 4 mm in displacement between positions, that would suggest instability and at that point I will be referring the patient to neurosurgery for fusion.  The patient was made aware of this and she understood and accepted.  Post-procedure evaluation    Procedure #1:  Anesthesia, Analgesia, Anxiolysis:  Type: Intra-Articular Hip Injection #1  Primary Purpose: Diagnostic Region: Posterolateral hip joint area. Level: Lower pelvic and hip joint level. Target Area: Superior aspect of the hip joint cavity, going thru the superior portion of the capsular ligament. Approach: Posterolateral approach. Laterality: Right  Anesthesia: Local (1-2% Lidocaine)  Anxiolysis: None  Sedation: None  Guidance: Fluoroscopy           Position: Lateral Decubitus with bad side up Area Prepped: Entire Posterolateral hip area. DuraPrep (Iodine Povacrylex [0.7% available iodine] and Isopropyl Alcohol, 74% w/w)   Procedure #2:    Type: Trochanteric Bursa Injection #1  Primary Purpose: Diagnostic Region: Upper (proximal) Femoral Region Level: Hip Joint Target Area: Superior aspect of the hip joint cavity, going thru the superior portion of the capsular ligament. Approach: Posterolateral approach Laterality: Right     1. Chronic hip pain (Right)   2. Osteoarthritis of hip (Right)   3. Greater trochanteric bursitis (Right)   4. Osteoarthritis of hips (Bilateral)    NAS-11 Pain score:   Pre-procedure: 9 /10    Post-procedure: 9 /10       Effectiveness:  Initial hour after procedure: 100 %. Subsequent 4-6 hours post-procedure: 100 %. Analgesia past initial 6 hours: 65 % (lasted 2 days). Ongoing improvement:  Analgesic: The patient indicates that the benefit from the intra-articular injection lasted only for 2 days. Function: Back to baseline ROM: Back to baseline  Pharmacotherapy Assessment  Analgesic: Oxycodone IR 10 mg, 1 tab PO qd,  from PCP. No chronic opioid analgesics therapy prescribed by our practice, 2ry to Medication Agreement Violation. (Getting oxycodone from PCP in multiple occasions, according to PMP)(11/21/20; 12/26/20; 01/16/21; 02/13/21). Opioid analgesic pharmacotherapy D/C'ed on 03/03/2021. MME: 20 mg/day.   Monitoring: [place] PMP: PDMP not reviewed this encounter.       Pharmacotherapy: No side-effects or adverse reactions reported. Compliance: No problems identified. Effectiveness: Clinically acceptable.  No notes on file  UDS:  Summary  Date Value Ref Range Status  11/12/2020 Note  Final    Comment:    ==================================================================== [employer] Select 13 (MW) ==================================================================== Test                             Result       Flag       Units  Drug Present and Declared for Prescription Verification   Norhydrocodone                 61           EXPECTED   ng/mg creat    Norhydrocodone is an expected metabolite of hydrocodone.    Tramadol                       >  4202        EXPECTED   ng/mg creat   O-Desmethyltramadol            >4202        EXPECTED   ng/mg creat   N-Desmethyltramadol            1712         EXPECTED   ng/mg creat    Source of tramadol is a prescription medication. O-desmethyltramadol    and N-desmethyltramadol are expected metabolites of tramadol.  Drug Absent but Declared for Prescription Verification   Hydrocodone                    Not Detected UNEXPECTED ng/mg  creat    Hydrocodone is almost always present in patients taking this drug    consistently. Absence of hydrocodone could be due to lapse of time    since the last dose or unusual pharmacokinetics (rapid metabolism).  ==================================================================== Test                      Result    Flag   Units      Ref Range   Creatinine              119              mg/dL      >=20 ==================================================================== Declared Medications:  The flagging and interpretation on this report are based on the  following declared medications.  Unexpected results may arise from  inaccuracies in the declared medications.   **Note: The testing scope of this panel includes these medications:   Hydrocodone  Tramadol   **Note: The testing scope of this panel does not include the  following reported medications:   Acetaminophen  Alendronate (Fosamax)  Aspirin  Calcium  Candesartan  Cholecalciferol  Cyanocobalamin  Cyclobenzaprine  Dulaglutide  Gabapentin  Glipizide  Meclizine  Multivitamin  Nystatin  Ondansetron  Ropinirole  Tolterodine  Warfarin ==================================================================== For clinical consultation, please call [phone]. ====================================================================      ROS  Constitutional: Denies any fever or chills Gastrointestinal: No reported hemesis, hematochezia, vomiting, or acute GI distress Musculoskeletal: Denies any acute onset joint swelling, redness, loss of ROM, or weakness Neurological: No reported episodes of acute onset apraxia, aphasia, dysarthria, agnosia, amnesia, paralysis, loss of coordination, or loss of consciousness  Medication Review  Dulaglutide, Vitamin B-12, alendronate, calcium carbonate, candesartan, cholecalciferol, cyclobenzaprine, gabapentin, glipiZIDE, meclizine, multivitamin with minerals, rOPINIRole, traMADol, and  warfarin  History Review  Allergy: Kara [name] is allergic to adhesive [tape], other, latex, and morphine and related. Drug: Kara [name]  reports no history of drug use. Alcohol:  reports no history of alcohol use. Tobacco:  reports that she has never smoked. She has never used smokeless tobacco. Social: Kara [name]  reports that she has never smoked. She has never used smokeless tobacco. She reports that she does not drink alcohol and does not use drugs. 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 Surgical: Kara [name]  has a past surgical history that includes Knee surgery; Carpal tunnel release (Bilateral); Colon resection sigmoid (N/A, 11/02/2016); Colostomy (N/A, 11/02/2016); Sigmoidoscopy (N/A, 11/13/2016); PULMONARY VENOGRAPHY (N/A, 11/19/2016); Colonoscopy with propofol (N/A, 02/03/2017); Colon surgery (11/02/2016); IVC FILTER INSERTION (Right, 10/2016); Colostomy reversal (  N/A, 02/24/2017); Appendectomy (N/A, 02/24/2017); Lysis of adhesion (N/A, 02/24/2017); laparoscopy (N/A, 02/24/2017); Ileo loop diversion (N/A, 02/24/2017); Sigmoidoscopy (N/A, 05/19/2017); Ileostomy closure (N/A, 06/01/2017); IVC FILTER REMOVAL (N/A, 08/09/2017); Sacroplasty (N/A, 11/08/2017); Cataract extraction w/PHACO (Left, 11/21/2018); Cataract extraction w/PHACO (Right, 12/12/2018); and Video bronchoscopy with endobronchial navigation (N/A, 02/23/2021). Family: family history includes Breast cancer in her mother; Cancer in her mother; Diabetes in her mother and sister; Heart disease in her father, mother, and sister.  Laboratory Chemistry Profile   Renal Lab Results  Component Value Date   BUN 16 02/19/2021   CREATININE 0.70 02/19/2021   GFRAA >60  07/09/2019   GFRNONAA >60 02/19/2021    Hepatic Lab Results  Component Value Date   AST 23 02/19/2021   ALT 27 02/19/2021   ALBUMIN 3.6 02/19/2021   ALKPHOS 43 02/19/2021   LIPASE 38 08/11/2020    Electrolytes Lab Results  Component Value Date   NA 139 02/19/2021   K 3.5 02/19/2021   CL 106 02/19/2021   CALCIUM 9.0 02/19/2021   MG 1.7 08/14/2020   PHOS 3.3 11/07/2016    Bone Lab Results  Component Value Date   25OHVITD1 25 (L) 11/02/2017   25OHVITD2 <1.0 11/02/2017   25OHVITD3 25 11/02/2017    Inflammation (CRP: Acute Phase) (ESR: Chronic Phase) Lab Results  Component Value Date   CRP 1.7 (H) 08/15/2020   ESRSEDRATE 12 11/02/2017         Note: Above Lab results reviewed.  Recent Imaging Review  DG PAIN CLINIC C-ARM 1-60 MIN NO REPORT Fluoro was used, but no Radiologist interpretation will be provided.  Please refer to "NOTES" tab for provider progress note. Note: Reviewed        Physical Exam  General appearance: Well nourished, well developed, and well hydrated. In no apparent acute distress Mental status: Alert, oriented x 3 (person, place, & time)       Respiratory: No evidence of acute respiratory distress Eyes: PERLA Vitals: BP (!) 153/74   Pulse 81   Temp (!) 97.5 F (36.4 C) (Temporal)   Resp 16   Ht _0  (1.6 m)   Wt 201 lb (91.2 kg)   SpO2 98%   BMI 35.61 kg/m  BMI: Estimated body mass index is 35.61 kg/m as calculated from the following:   Height as of this encounter: _1  (1.6 m).   Weight as of this encounter: 201 lb (91.2 kg). Ideal: Ideal body weight: 52.4 kg (115 lb 8.3 oz) Adjusted ideal body weight: 67.9 kg (149 lb 11.4 oz)  Assessment   Diagnosis Status  1. Chronic hip pain (Right)   2. Greater trochanteric bursitis (Right)   3. Osteoarthritis of hip (Right)   4. Chronic low back pain (1ry area of Pain) (Bilateral) w/o sciatica   5. Chronic lower extremity pain (2ry area of Pain) (Bilateral)   6. Grade 1-2 Anterolisthesis of  L4/L5 & L5/S1   7. Osteoarthritis of hips (Bilateral)   8. Lumbar facet syndrome (Bilateral)   9. Chronic pain syndrome   10. Chronic anticoagulation (COUMADIN)    Controlled Controlled Controlled   Updated Problems: No problems updated.  Plan of Care  Problem-specific:  No problem-specific Assessment & Plan notes found for this encounter.  Kara [name] has a current medication list which includes the following long-term medication(s): calcium carbonate, candesartan, glipizide, glipizide, ropinirole, and warfarin.  Pharmacotherapy (Medications Ordered): No orders of the defined types were placed in this encounter.  Orders:  Orders Placed  This Encounter  Procedures   Lumbar Transforaminal Epidural    Standing Status:   Future    Standing Expiration Date:   02/04/2022    Scheduling Instructions:     Laterality: Right-sided     Level(s): L4             Sedation: Patient's choice.     Timeframe: ASAP    Order Specific Question:   Where will this procedure be performed?    Answer:   [name]   DG Lumbar Spine Complete W/Bend    Patient presents with axial pain with possible radicular component. Please assist [place] in identifying specific level(s) and laterality of any additional findings such as: 1. Facet (Zygapophyseal) joint DJD (Hypertrophy, space narrowing, subchondral sclerosis, and/or osteophyte formation) 2. DDD and/or IVDD (Loss of disc height, desiccation, gas patterns, osteophytes, endplate sclerosis, or "Black disc disease") 3. Pars defects 4. Spondylolisthesis, spondylosis, and/or spondyloarthropathies (include Degree/Grade of displacement in mm) (stability) 5. Vertebral body Fractures (acute/chronic) (state percentage of collapse) 6. Demineralization (osteopenia/osteoporotic) 7. Bone pathology 8. Foraminal narrowing  9. Surgical changes    Standing Status:   Future    Number of Occurrences:   1    Standing Expiration Date:   12/04/2021     Scheduling Instructions:     Imaging must be done as soon as possible. Inform patient that order will expire within 30 days and I will not renew it.    Order Specific Question:   Reason for Exam (SYMPTOM  OR DIAGNOSIS REQUIRED)    Answer:   Low back pain    Order Specific Question:   Preferred imaging location?    Answer:   [facility]    Order Specific Question:   Call Results- Best Contact Number?    Answer:   [phone] ([facility])    Order Specific Question:   Radiology Contrast Protocol - do NOT remove file path    Answer:   [email or web address]    Order Specific Question:   Release to patient    Answer:   Immediate   Blood Thinner Instructions to Nursing    Always make sure patient has clearance from prescribing physician to stop blood thinners for interventional therapies. If the patient requires a Lovenox-bridge therapy, make sure arrangements are made to institute it with the assistance of the PCP.    Scheduling Instructions:     Have Kara [name] stop the Coumadin (Warfarin) X 5 days prior to procedure or surgery.   Follow-up plan:   Return for (Clinic) procedure: (R) L4 TFESI #1, (Blood Thinner Protocol).     Interventional Therapies  Risk  Complexity Considerations:   Estimated body mass index is 37.45 kg/m as calculated from the following:   Height as of 05/05/21: _0  (1.6 m).   Weight as of 05/13/21: 211 lb 6.4 oz (95.9 kg). 1.  Coumadin anticoagulation  (Stop: 5 days  Re-start: 2hrs) 2.  Latex allergy  3.  Morbid obesity (class III) (BMI>40)  4.  No Lumbar RFA due to morbid obesity    Planned  Pending:   Diagnostic/therapeutic right L4 TFESI #1    Under consideration:   Diagnostic/therapeutic right L4 TFESI #1    Completed:   Diagnostic/therapeutic right IA hip injection x1 (09/15/2021) (85/85/85)  Diagnostic/therapeutic right trochanteric bursa injection x1 (09/15/2021) (85/85/85)  Therapeutic left  lumbar facet RFA x1 (07/21/2021) (100/100/100 x1 week/80)  Therapeutic right lumbar facet RFA x1 (07/07/2021) (100/100/80/80)  Palliative/Therapeutic left L4-5 LESI x1 (  11/10/2017) (100/80/0/0-25)  Palliative left lumbar facet MBB x17 (05/05/2021) (100/100/75/85)  Palliative right lumbar facet MBB x23 (05/05/2021) (100/100/75/85)  Palliative right SI joint block x8 (not effective) (03/08/2019) (100/100/100/0)  Diagnostic left SI joint block x1 (not effective) (03/08/2019) (100/100/100/0)  Palliative/Therapeutic (Midline) caudal ESI x3 (06/08/2018) (100/100/100 x 2-3 days/0)    Therapeutic  Palliative (PRN) options:   Palliative lumbar facet MBB     Recent Visits Date Type Provider Dept  09/29/21 Office Visit Milinda Pointer, MD [name]  09/15/21 Procedure visit Milinda Pointer, MD [name]  09/01/21 Office Visit Milinda Pointer, MD [name]  Showing recent visits within past 90 days and meeting all other requirements Today's Visits Date Type Provider Dept  11/04/21 Office Visit Milinda Pointer, MD [name]  Showing today's visits and meeting all other requirements Future Appointments Date Type Provider Dept  11/19/21 Appointment Milinda Pointer, MD [name]  Showing future appointments within next 90 days and meeting all other requirements  I discussed the assessment and treatment plan with the patient. The patient was provided an opportunity to ask questions and all were answered. The patient agreed with the plan and demonstrated an understanding of the instructions.  Patient advised to call back or seek an in-person evaluation if the symptoms or condition worsens.  Duration of encounter: 35 minutes.  Note by: Kara Cola, MD Date: 11/04/2021; Time: 3:55 PM

## 2021-11-05 DIAGNOSIS — R29898 Other symptoms and signs involving the musculoskeletal system: Secondary | ICD-10-CM | POA: Diagnosis not present

## 2021-11-05 DIAGNOSIS — R269 Unspecified abnormalities of gait and mobility: Secondary | ICD-10-CM | POA: Diagnosis not present

## 2021-11-06 DIAGNOSIS — R791 Abnormal coagulation profile: Secondary | ICD-10-CM | POA: Diagnosis not present

## 2021-11-06 DIAGNOSIS — Z86711 Personal history of pulmonary embolism: Secondary | ICD-10-CM | POA: Diagnosis not present

## 2021-11-06 DIAGNOSIS — Z7901 Long term (current) use of anticoagulants: Secondary | ICD-10-CM | POA: Diagnosis not present

## 2021-11-16 NOTE — Progress Notes (Signed)
PROVIDER NOTE: Interpretation of information contained herein should be left to medically-trained personnel. Specific patient instructions are provided elsewhere under "Patient Instructions" section of medical record. This document was created in part using STT-dictation technology, any transcriptional errors that may result from this process are unintentional.  Patient: Kara [name] Type: Established DOB: 02/09/1951 MRN: [id number] PCP: Kara Harrier, MD  Service: Procedure DOS: 11/19/2021 Setting: Ambulatory Location: Ambulatory outpatient facility Delivery: Face-to-face Provider: Gaspar Cola, MD Specialty: Interventional Pain Management Specialty designation: 09 Location: Outpatient facility Ref. Prov.: Kara Harrier, MD    Primary Reason for Visit: Interventional Pain Management Treatment. CC: Back Pain (Right, lower)   Procedure:           Type: Trans-Foraminal Epidural Steroid Injection (Lumbar) #1  Laterality: Right (-RT)  Level: L4 nerve root(s) Imaging: Fluoroscopy-guided Anesthesia: Local anesthesia (1-2% Lidocaine) Anxiolysis: None                 Sedation: None. DOS: 11/19/2021  Performed by: Kara Cola, MD  Purpose: Diagnostic/Therapeutic Indications: Lumbar radicular pain severe enough to impact quality of life or function. 1. DDD (degenerative disc disease), lumbosacral   2. Grade 1-2 Anterolisthesis of L4/L5 & L5/S1   3. 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 Chronic lower extremity pain (2ry area of Pain) (Bilateral)   7. Abnormal MRI, lumbar spine (08/09/2019)   8. Chronic anticoagulation (COUMADIN)   9. Latex precautions, history of latex allergy   10. Severe obesity (BMI 35.0-39.9) with comorbidity ([place])    NAS-11 Pain score:   Pre-procedure: 10-Worst pain ever/10   Post-procedure: 8 /10      Position / Prep / Materials:  Position: Prone  Prep solution: DuraPrep (Iodine Povacrylex [0.7% available iodine] and Isopropyl Alcohol, 74% w/w) Prep Area: Entire Posterior Lumbosacral Area.  From the lower tip of the scapula down to the tailbone and from flank to flank. Materials:  Tray: Block Needle(s):  Type: Spinal  Gauge (G): 22  Length: 5-in  Qty: 1  Pre-op H&P Assessment:  Kara [name] is a 71 y.o. (year old), female patient, seen today for interventional treatment. She  has a past surgical history that includes Knee surgery; Carpal tunnel release (Bilateral); Colon resection sigmoid (N/A, 11/02/2016); Colostomy (N/A, 11/02/2016); Sigmoidoscopy (N/A, 11/13/2016); PULMONARY VENOGRAPHY (N/A, 11/19/2016); Colonoscopy with propofol (N/A, 02/03/2017); Colon surgery (11/02/2016); IVC FILTER INSERTION (Right, 10/2016); Colostomy reversal (N/A, 02/24/2017); Appendectomy (N/A, 02/24/2017); Lysis of adhesion (N/A, 02/24/2017); laparoscopy (N/A, 02/24/2017); Ileo loop diversion (N/A, 02/24/2017); Sigmoidoscopy (N/A, 05/19/2017); Ileostomy closure (N/A, 06/01/2017); IVC FILTER REMOVAL (N/A, 08/09/2017); Sacroplasty (N/A, 11/08/2017); Cataract extraction w/PHACO (Left, 11/21/2018); Cataract extraction w/PHACO (Right, 12/12/2018); and Video bronchoscopy with endobronchial navigation (N/A, 02/23/2021). Kara [name] has a current medication list which includes the following prescription(s): alendronate, candesartan, cholecalciferol, vitamin b-12, cyclobenzaprine, dulaglutide, gabapentin, gabapentin, glipizide, glipizide, meclizine, multivitamin with minerals, ropinirole, tramadol, warfarin, and calcium carbonate, and the following Facility-Administered Medications: pentafluoroprop-tetrafluoroeth. Her primarily concern today is the Back Pain (Right, lower)  Initial Vital Signs:  Pulse/HCG Rate: 80  Temp: (!) 97.5 F (36.4 C) Resp: 16 BP: 133/63 SpO2: 96 %  BMI: Estimated body mass index is 35.43 kg/m as calculated  from the following:   Height as of this encounter: 5\' 3"  (1.6 m).   Weight as of this encounter: 200 lb (90.7 kg).  Risk Assessment: Allergies: Reviewed. She is allergic  to adhesive [tape], other, latex, and morphine and related.  Allergy Precautions: None required Coagulopathies: Reviewed. None identified.  Blood-thinner therapy: None at this time Active Infection(s): Reviewed. None identified. Kara [name] is afebrile  Site Confirmation: Kara [name] was asked to confirm the procedure and laterality before marking the site Procedure checklist: Completed Consent: Before the procedure and under the influence of no sedative(s), amnesic(s), or anxiolytics, the patient was informed of the treatment options, risks and possible complications. To fulfill our ethical and legal obligations, as recommended by the American Medical Association's Code of Ethics, I have informed the patient of my clinical impression; the nature and purpose of the treatment or procedure; the risks, benefits, and possible complications of the intervention; the alternatives, including doing nothing; the risk(s) and benefit(s) of the alternative treatment(s) or procedure(s); and the risk(s) and benefit(s) of doing nothing. The patient was provided information about the general risks and possible complications associated with the procedure. These may include, but are not limited to: failure to achieve desired goals, infection, bleeding, organ or nerve damage, allergic reactions, paralysis, and death. In addition, the patient was informed of those risks and complications associated to Spine-related procedures, such as failure to decrease pain; infection (i.e.: Meningitis, epidural or intraspinal abscess); bleeding (i.e.: epidural hematoma, subarachnoid hemorrhage, or any other type of intraspinal or peri-dural bleeding); organ or nerve damage (i.e.: Any type of peripheral nerve, nerve root, or spinal cord injury) with subsequent damage  to sensory, motor, and/or autonomic systems, resulting in permanent pain, numbness, and/or weakness of one or several areas of the body; allergic reactions; (i.e.: anaphylactic reaction); and/or death. Furthermore, the patient was informed of those risks and complications associated with the medications. These include, but are not limited to: allergic reactions (i.e.: anaphylactic or anaphylactoid reaction(s)); adrenal axis suppression; blood sugar elevation that in diabetics may result in ketoacidosis or comma; water retention that in patients with history of congestive heart failure may result in shortness of breath, pulmonary edema, and decompensation with resultant heart failure; weight gain; swelling or edema; medication-induced neural toxicity; particulate matter embolism and blood vessel occlusion with resultant organ, and/or nervous system infarction; and/or aseptic necrosis of one or more joints. Finally, the patient was informed that Medicine is not an exact science; therefore, there is also the possibility of unforeseen or unpredictable risks and/or possible complications that may result in a catastrophic outcome. The patient indicated having understood very clearly. We have given the patient no guarantees and we have made no promises. Enough time was given to the patient to ask questions, all of which were answered to the patient's satisfaction. Ms. Shoff has indicated that she wanted to continue with the procedure. Attestation: I, the ordering provider, attest that I have discussed with the patient the benefits, risks, side-effects, alternatives, likelihood of achieving goals, and potential problems during recovery for the procedure that I have provided informed consent. Date  Time: 11/19/2021  1:45 PM  Pre-Procedure Preparation:  Monitoring: As per clinic protocol. Respiration, ETCO2, SpO2, BP, heart rate and rhythm monitor placed and checked for adequate function Safety Precautions: Patient  was assessed for positional comfort and pressure points before starting the procedure. Time-out: I initiated and conducted the "Time-out" before starting the procedure, as per protocol. The patient was asked to participate by confirming the accuracy of the "Time Out" information. Verification of the correct person, site, and procedure were performed and confirmed by me, the nursing staff, and the patient. "Time-out" conducted as per Joint Commission's Universal Protocol (  [id number].01.01.01). Time: 1422  Description/Narrative of Procedure:          Target: The 6 o'clock position under the pedicle, on the affected side. Region: Posterolateral Lumbosacral Approach: Posterior Percutaneous Paravertebral approach.  Rationale (medical necessity): procedure needed and proper for the diagnosis and/or treatment of the patient's medical symptoms and needs. Procedural Technique Safety Precautions: Aspiration looking for blood return was conducted prior to all injections. At no point did we inject any substances, as a needle was being advanced. No attempts were made at seeking any paresthesias. Safe injection practices and needle disposal techniques used. Medications properly checked for expiration dates. SDV (single dose vial) medications used. Description of the Procedure: Protocol guidelines were followed. The patient was placed in position over the procedure table. The target area was identified and the area prepped in the usual manner. Skin & deeper tissues infiltrated with local anesthetic. Appropriate amount of time allowed to pass for local anesthetics to take effect. The procedure needles were then advanced to the target area. Proper needle placement secured. Negative aspiration confirmed. Solution injected in intermittent fashion, asking for systemic symptoms every 0.5cc of injectate. The needles were then removed and the area cleansed, making sure to leave some of the prepping solution back to take advantage of  its long term bactericidal properties.  Vitals:   11/19/21 1425 11/19/21 1430 11/19/21 1433 11/19/21 1446  BP: 138/79 128/83 132/77 137/70  Pulse: 86 85 83 84  Resp: (!) 22 (!) 22 (!) 21 20  Temp:      TempSrc:      SpO2: 93% 95% 96% 100%  Weight:      Height:        Start Time: 1422 hrs. End Time: 1432 hrs.  Imaging Guidance (Spinal):          Type of Imaging Technique: Fluoroscopy Guidance (Spinal) Indication(s): Assistance in needle guidance and placement for procedures requiring needle placement in or near specific anatomical locations not easily accessible without such assistance. Exposure Time: Please see nurses notes. Contrast: Before injecting any contrast, we confirmed that the patient did not have an allergy to iodine, shellfish, or radiological contrast. Once satisfactory needle placement was completed at the desired level, radiological contrast was injected. Contrast injected under live fluoroscopy. No contrast complications. See chart for type and volume of contrast used. Fluoroscopic Guidance: I was personally present during the use of fluoroscopy. "Tunnel Vision Technique" used to obtain the best possible view of the target area. Parallax error corrected before commencing the procedure. "Direction-depth-direction" technique used to introduce the needle under continuous pulsed fluoroscopy. Once target was reached, antero-posterior, oblique, and lateral fluoroscopic projection used confirm needle placement in all planes. Images permanently stored in EMR. Interpretation: I personally interpreted the imaging intraoperatively. Adequate needle placement confirmed in multiple planes. Appropriate spread of contrast into desired area was observed. No evidence of afferent or efferent intravascular uptake. No intrathecal or subarachnoid spread observed. Permanent images saved into the patient's record.  Antibiotic Prophylaxis:   Anti-infectives (From admission, onward)    None       Indication(s): None identified  Post-operative Assessment:  Post-procedure Vital Signs:  Pulse/HCG Rate: 84  Temp: (!) 97.5 F (36.4 C) Resp: 20 BP: 137/70 SpO2: 100 %  EBL: None  Complications: No immediate post-treatment complications observed by team, or reported by patient.  Unfortunately, as the procedure table was being lowered, the patient had her hands resting on a stool and apparently the hands got pinched between the table that  was being lowered and the stool.  I evaluated the patient while she was in the recovery area, and she did have bruising in the back of both of her hands.  She was able to move her fingers, and she was offered to be sent downstairs to have x-rays done of her hands to make sure that there were no fractures.  Initial evaluation did not seem to be immediately compatible with any fractures, but she was offered the alternative to further evaluated using diagnostic x-rays.  The patient indicated that she did not feel it was necessary.  She was instructed to give [place] a call if there were any problems.  We will follow-up with her in 2 weeks for her postprocedure evaluation and we will also check to see how she is doing in terms of her hands.  Note: The patient tolerated the entire procedure well. A repeat set of vitals were taken after the procedure and the patient was kept under observation following institutional policy, for this type of procedure. Post-procedural neurological assessment was performed, showing return to baseline, prior to discharge. The patient was provided with post-procedure discharge instructions, including a section on how to identify potential problems. Should any problems arise concerning this procedure, the patient was given instructions to immediately contact us, at any time, without hesitation. In any case, we plan to contact the patient by telephone for a follow-up status report regarding this interventional procedure.  Comments:  No  additional relevant information.  Plan of Care  Orders:  Orders Placed This Encounter  Procedures   Lumbar Transforaminal Epidural    Scheduling Instructions:     Laterality: Right-sided     Level(s): L4             Sedation: Patient's choice.     Timeframe: Today    Order Specific Question:   Where will this procedure be performed?    Answer:   [name]   DG PAIN [facility] C-ARM 1-60 MIN NO REPORT    Intraoperative interpretation by procedural physician at [facility].    Standing Status:   Standing    Number of Occurrences:   1    Order Specific Question:   Reason for exam:    Answer:   Assistance in needle guidance and placement for procedures requiring needle placement in or near specific anatomical locations not easily accessible without such assistance.   Informed Consent Details: Physician/Practitioner Attestation; Transcribe to consent form and obtain patient signature    Provider Attestation: I, [place] Dossie Arbour, MD, (Pain Management Specialist), the physician/practitioner, attest that I have discussed with the patient the benefits, risks, side effects, alternatives, likelihood of achieving goals and potential problems during recovery for the procedure that I have provided informed consent.    Scheduling Instructions:     Note: Always confirm laterality of pain with Ms. Eastep, before procedure.     Transcribe to consent form and obtain patient signature.    Order Specific Question:   Physician/Practitioner attestation of informed consent for procedure/surgical case    Answer:   I, the physician/practitioner, attest that I have discussed with the patient the benefits, risks, side effects, alternatives, likelihood of achieving goals and potential problems during recovery for the procedure that I have provided informed consent.    Order Specific Question:   Procedure    Answer:   Diagnostic lumbar transforaminal epidural steroid injection under  fluoroscopic guidance. (See notes for level and laterality.)    Order Specific Question:  Physician/Practitioner performing the procedure    Answer:   [place]. Dossie Arbour, MD    Order Specific Question:   Indication/Reason    Answer:   Lumbar radiculopathy/radiculitis associated with lumbar stenosis   Care order/instruction: Please confirm that the patient has stopped the Coumadin (Warfarin) X 5 days prior to procedure or surgery.    Please confirm that the patient has stopped the Coumadin (Warfarin) X 5 days prior to procedure or surgery.    Standing Status:   Standing    Number of Occurrences:   1   Provide equipment / supplies at bedside    "Block Tray" (Disposable  single use) Needle type: SpinalSpinal Amount/quantity: 1 Size: Medium (5-inch) Gauge: 22G    Standing Status:   Standing    Number of Occurrences:   1    Order Specific Question:   Specify    Answer:   Block Tray   Bleeding precautions    Standing Status:   Standing    Number of Occurrences:   1   Latex precautions    Activate Latex-Free Protocol.    Standing Status:   Standing    Number of Occurrences:   1   Chronic Opioid Analgesic:  Oxycodone IR 10 mg, 1 tab PO qd,  from PCP. No chronic opioid analgesics therapy prescribed by our practice, 2ry to Medication Agreement Violation. (Getting oxycodone from PCP in multiple occasions, according to PMP)(11/21/20; 12/26/20; 01/16/21; 02/13/21). Opioid analgesic pharmacotherapy D/C'ed on 03/03/2021. MME: 20 mg/day.   Medications ordered for procedure: Meds ordered this encounter  Medications   iohexol (OMNIPAQUE) 180 MG/ML injection 10 mL    Must be Myelogram-compatible. If not available, you may substitute with a water-soluble, non-ionic, hypoallergenic, myelogram-compatible radiological contrast medium.   lidocaine (XYLOCAINE) 2 % (with pres) injection 400 mg   pentafluoroprop-tetrafluoroeth (GEBAUERS) aerosol   lactated ringers infusion   midazolam (VERSED) 5 MG/5ML  injection 0.5-2 mg    Make sure Flumazenil is available in the pyxis when using this medication. If oversedation occurs, administer 0.2 mg IV over 15 sec. If after 45 sec no response, administer 0.2 mg again over 1 min; may repeat at 1 min intervals; not to exceed 4 doses (1 mg)   sodium chloride flush (NS) 0.9 % injection 1 mL   ropivacaine (PF) 2 mg/mL (0.2%) (NAROPIN) injection 1 mL   dexamethasone (DECADRON) injection 10 mg   Medications administered: We administered iohexol, lidocaine, lactated ringers, midazolam, sodium chloride flush, ropivacaine (PF) 2 mg/mL (0.2%), and dexamethasone.  See the medical record for exact dosing, route, and time of administration.  Follow-up plan:   Return in about 2 weeks (around 12/03/2021) for Proc-day (T,Th), (F2F), (PPE).       Interventional Therapies  Risk  Complexity Considerations:   Estimated body mass index is 37.45 kg/m as calculated from the following:   Height as of 05/05/21: 5\' 3"  (1.6 m).   Weight as of 05/13/21: 211 lb 6.4 oz (95.9 kg). 1.  Coumadin anticoagulation  (Stop: 5 days  Re-start: 2hrs) 2.  Latex allergy  3.  Morbid obesity (class III) (BMI>40)  4.  No Lumbar RFA due to morbid obesity    Planned  Pending:   Diagnostic/therapeutic right L4 TFESI #1    Under consideration:   Diagnostic/therapeutic right L4 TFESI #1    Completed:   Diagnostic/therapeutic right IA hip injection x1 (09/15/2021) (85/85/85)  Diagnostic/therapeutic right trochanteric bursa injection x1 (09/15/2021) (85/85/85)  Therapeutic left lumbar facet RFA x1 (07/21/2021) (100/100/100  x1 week/80)  Therapeutic right lumbar facet RFA x1 (07/07/2021) (100/100/80/80)  Palliative/Therapeutic left L4-5 LESI x1 (11/10/2017) (100/80/0/0-25)  Palliative left lumbar facet MBB x17 (05/05/2021) (100/100/75/85)  Palliative right lumbar facet MBB x23 (05/05/2021) (100/100/75/85)  Palliative right SI joint block x8 (not effective) (03/08/2019) (100/100/100/0)   Diagnostic left SI joint block x1 (not effective) (03/08/2019) (100/100/100/0)  Palliative/Therapeutic (Midline) caudal ESI x3 (06/08/2018) (100/100/100 x 2-3 days/0)    Therapeutic  Palliative (PRN) options:   Palliative lumbar facet MBB      Recent Visits Date Type Provider Dept  11/04/21 Office Visit Milinda Pointer, MD [name]  09/29/21 Office Visit Milinda Pointer, MD [name]  09/15/21 Procedure visit Milinda Pointer, MD [name]  09/01/21 Office Visit Milinda Pointer, MD [name]  Showing recent visits within past 90 days and meeting all other requirements Today's Visits Date Type Provider Dept  11/19/21 Procedure visit Milinda Pointer, MD [name]  Showing today's visits and meeting all other requirements Future Appointments Date Type Provider Dept  12/08/21 Appointment Milinda Pointer, MD [name]  Showing future appointments within next 90 days and meeting all other requirements  Disposition: Discharge home  Discharge (Date  Time): 11/19/2021; 1450 hrs.   Primary Care Physician: Kara Harrier, MD Location: [facility] Note by: Kara Cola, MD Date: 11/19/2021; Time: 5:34 PM  Disclaimer:  Medicine is not an Chief Strategy Officer. The only guarantee in medicine is that nothing is guaranteed. It is important to note that the decision to proceed with this intervention was based on the information collected from the patient. The Data and conclusions were drawn from the patient's questionnaire, the interview, and the physical examination. Because the information was provided in large part by the patient, it cannot be guaranteed that it has not been purposely or unconsciously manipulated. Every effort has been made to obtain as much relevant data as possible for this evaluation. It is important to note that the conclusions that lead to this procedure are  derived in large part from the available data. Always take into account that the treatment will also be dependent on availability of resources and existing treatment guidelines, considered by other Pain Management Practitioners as being common knowledge and practice, at the time of the intervention. For Medico-Legal purposes, it is also important to point out that variation in procedural techniques and pharmacological choices are the acceptable norm. The indications, contraindications, technique, and results of the above procedure should only be interpreted and judged by a Board-Certified Interventional Pain Specialist with extensive familiarity and expertise in the same exact procedure and technique.

## 2021-11-19 ENCOUNTER — Other Ambulatory Visit: Payer: Self-pay

## 2021-11-19 ENCOUNTER — Emergency Department: Payer: PPO

## 2021-11-19 ENCOUNTER — Ambulatory Visit (HOSPITAL_BASED_OUTPATIENT_CLINIC_OR_DEPARTMENT_OTHER): Payer: PPO | Admitting: Pain Medicine

## 2021-11-19 ENCOUNTER — Encounter: Payer: Self-pay | Admitting: Pain Medicine

## 2021-11-19 ENCOUNTER — Emergency Department
Admission: EM | Admit: 2021-11-19 | Discharge: 2021-11-19 | Disposition: A | Discharge status: Home / Self Care | Payer: PPO | Attending: Emergency Medicine | Admitting: Emergency Medicine

## 2021-11-19 ENCOUNTER — Ambulatory Visit
Admission: RE | Admit: 2021-11-19 | Discharge: 2021-11-19 | Disposition: A | Discharge status: Home / Self Care | Payer: PPO | Source: Ambulatory Visit | Attending: Pain Medicine | Admitting: Pain Medicine

## 2021-11-19 VITALS — BP 137/70 | HR 84 | Temp 97.5°F | Resp 20 | Ht 63.0 in | Wt 200.0 lb

## 2021-11-19 DIAGNOSIS — G8929 Other chronic pain: Secondary | ICD-10-CM

## 2021-11-19 DIAGNOSIS — M4306 Spondylolysis, lumbar region: Secondary | ICD-10-CM | POA: Insufficient documentation

## 2021-11-19 DIAGNOSIS — M5137 Other intervertebral disc degeneration, lumbosacral region: Secondary | ICD-10-CM

## 2021-11-19 DIAGNOSIS — M5441 Lumbago with sciatica, right side: Secondary | ICD-10-CM | POA: Diagnosis not present

## 2021-11-19 DIAGNOSIS — Z91040 Latex allergy status: Secondary | ICD-10-CM | POA: Diagnosis not present

## 2021-11-19 DIAGNOSIS — M48061 Spinal stenosis, lumbar region without neurogenic claudication: Secondary | ICD-10-CM | POA: Diagnosis not present

## 2021-11-19 DIAGNOSIS — M51379 Other intervertebral disc degeneration, lumbosacral region without mention of lumbar back pain or lower extremity pain: Secondary | ICD-10-CM

## 2021-11-19 DIAGNOSIS — M79605 Pain in left leg: Secondary | ICD-10-CM | POA: Diagnosis not present

## 2021-11-19 DIAGNOSIS — S0003XA Contusion of scalp, initial encounter: Secondary | ICD-10-CM | POA: Insufficient documentation

## 2021-11-19 DIAGNOSIS — M79604 Pain in right leg: Secondary | ICD-10-CM | POA: Insufficient documentation

## 2021-11-19 DIAGNOSIS — M4310 Spondylolisthesis, site unspecified: Secondary | ICD-10-CM | POA: Insufficient documentation

## 2021-11-19 DIAGNOSIS — Z7901 Long term (current) use of anticoagulants: Secondary | ICD-10-CM | POA: Diagnosis not present

## 2021-11-19 DIAGNOSIS — M5442 Lumbago with sciatica, left side: Secondary | ICD-10-CM | POA: Diagnosis not present

## 2021-11-19 DIAGNOSIS — R937 Abnormal findings on diagnostic imaging of other parts of musculoskeletal system: Secondary | ICD-10-CM | POA: Insufficient documentation

## 2021-11-19 DIAGNOSIS — M5416 Radiculopathy, lumbar region: Secondary | ICD-10-CM | POA: Diagnosis not present

## 2021-11-19 DIAGNOSIS — G4489 Other headache syndrome: Secondary | ICD-10-CM | POA: Diagnosis not present

## 2021-11-19 DIAGNOSIS — W19XXXA Unspecified fall, initial encounter: Secondary | ICD-10-CM | POA: Diagnosis not present

## 2021-11-19 DIAGNOSIS — R200 Anesthesia of skin: Secondary | ICD-10-CM | POA: Diagnosis not present

## 2021-11-19 DIAGNOSIS — Z043 Encounter for examination and observation following other accident: Secondary | ICD-10-CM | POA: Diagnosis not present

## 2021-11-19 DIAGNOSIS — S0990XA Unspecified injury of head, initial encounter: Secondary | ICD-10-CM | POA: Diagnosis not present

## 2021-11-19 MED ORDER — SODIUM CHLORIDE (PF) 0.9 % IJ SOLN
INTRAMUSCULAR | Status: AC
  Filled 2021-11-19: qty 10

## 2021-11-19 MED ORDER — IOHEXOL 180 MG/ML  SOLN
INTRAMUSCULAR | Status: AC
  Filled 2021-11-19: qty 20

## 2021-11-19 MED ORDER — SODIUM CHLORIDE 0.9% FLUSH
1.0000 mL | Freq: Once | INTRAVENOUS | Status: AC
  Administered 2021-11-19: 1 mL

## 2021-11-19 MED ORDER — DEXAMETHASONE SODIUM PHOSPHATE 10 MG/ML IJ SOLN
10.0000 mg | Freq: Once | INTRAMUSCULAR | Status: AC
  Administered 2021-11-19: 10 mg
  Filled 2021-11-19: qty 1

## 2021-11-19 MED ORDER — ROPIVACAINE HCL 2 MG/ML IJ SOLN
1.0000 mL | Freq: Once | INTRAMUSCULAR | Status: AC
  Administered 2021-11-19: 1 mL via EPIDURAL
  Filled 2021-11-19: qty 20

## 2021-11-19 MED ORDER — LACTATED RINGERS IV SOLN: Freq: Once | INTRAVENOUS | Status: AC

## 2021-11-19 MED ORDER — MIDAZOLAM HCL 5 MG/5ML IJ SOLN
0.5000 mg | Freq: Once | INTRAMUSCULAR | Status: AC
  Administered 2021-11-19: 2 mg via INTRAVENOUS
  Filled 2021-11-19: qty 5

## 2021-11-19 MED ORDER — LIDOCAINE HCL 2 % IJ SOLN
20.0000 mL | Freq: Once | INTRAMUSCULAR | Status: AC
  Administered 2021-11-19: 400 mg
  Filled 2021-11-19: qty 20

## 2021-11-19 MED ORDER — DEXAMETHASONE SODIUM PHOSPHATE 10 MG/ML IJ SOLN
INTRAMUSCULAR | Status: AC
  Filled 2021-11-19: qty 1

## 2021-11-19 MED ORDER — PENTAFLUOROPROP-TETRAFLUOROETH EX AERO
INHALATION_SPRAY | Freq: Once | CUTANEOUS | Status: DC
  Filled 2021-11-19: qty 116

## 2021-11-19 MED ORDER — IOHEXOL 180 MG/ML  SOLN
10.0000 mL | Freq: Once | INTRAMUSCULAR | Status: AC
  Administered 2021-11-19: 10 mL via INTRATHECAL
  Filled 2021-11-19: qty 20

## 2021-11-19 NOTE — ED Provider Notes (Signed)
[facility] Provider Note    Event Date/Time   First MD Initiated Contact with Patient 11/19/21 2217     (approximate)   History   Fall   HPI  Kara [name] is a 71 y.o. female who presents to the ED for evaluation of Fall   I reviewed interventional pain clinic visit from earlier today.  History of chronic hip and lower back pain , today had a lumbar, L4, right-sided transforaminal epidural injection.  Patient reports after this injection she went to [employer] and was going to have lunch with her family when trying to get out of the car she noted that her right leg and knee was totally numb.  She reports friend stepped out of the truck and her numb leg made it difficult to ambulate so that she fell and struck her occiput on the ground.  Denies any syncope.  By the time I see her, she has unfortunately been waiting about 6 hours due to prolonged wait times in our ED due to bed holds, and within that timeframe, she reports feeling better now.  She had called the office of the interventional pain clinic and they recommended waiting for 24 hours and reassessing, but she wanted to get checked out because of the fall today as well.  Physical Exam   Triage Vital Signs: ED Triage Vitals  Enc Vitals Group     BP 11/19/21 1646 (!) 152/78     Pulse Rate 11/19/21 1646 91     Resp 11/19/21 1646 16     Temp 11/19/21 1646 97.6 F (36.4 C)     Temp Source 11/19/21 1646 Oral     SpO2 11/19/21 1646 98 %     Weight --      Height --      Head Circumference --      Peak Flow --      Pain Score 11/19/21 1649 8     Pain Loc --      Pain Edu? --      Excl. in [place]? --     Most recent vital signs: Vitals:   11/19/21 1646  BP: (!) 152/78  Pulse: 91  Resp: 16  Temp: 97.6 F (36.4 C)  SpO2: 98%    General: Awake, no distress.  Obese.  Stands with my assistance and ambulates with minimal assistance. CV:  Good peripheral perfusion.  Resp:  Normal  effort.  Abd:  No distention.  MSK:  Small closed hematoma to the occiput, 2 cm in diameter, no bleeding or laceration.  No step-offs No point bony tenderness to the back or signs of trauma to the back Neuro:  No focal deficits appreciated. Cranial nerves II through XII intact 5/5 strength and sensation in all 4 extremities Other:     ED Results / Procedures / Treatments   Labs (all labs ordered are listed, but only abnormal results are displayed) Labs Reviewed - No data to display  EKG   RADIOLOGY CT head interpreted by me without evidence of acute intracranial pathology CT cervical spine interpreted by me without evidence of fracture or dislocation. Plain film of the pelvis and right hip did by me without evidence of fracture or dislocation  Official radiology report(s): CT Cervical Spine Wo Contrast  Result Date: 11/19/2021 CLINICAL DATA:  Fall EXAM: CT CERVICAL SPINE WITHOUT CONTRAST TECHNIQUE: Multidetector CT imaging of the cervical spine was performed without intravenous contrast. Multiplanar CT image reconstructions were also generated.  RADIATION DOSE REDUCTION: This exam was performed according to the departmental dose-optimization program which includes automated exposure control, adjustment of the mA and/or kV according to patient size and/or use of iterative reconstruction technique. COMPARISON:  None Available. FINDINGS: Alignment: Normal Skull base and vertebrae: No acute fracture. No primary bone lesion or focal pathologic process. Soft tissues and spinal canal: No prevertebral fluid or swelling. No visible canal hematoma. Disc levels: Disc space narrowing at C6-7 and C7-T1. Early anterior spurring in the lower cervical spine. Upper chest: No acute findings Other: None IMPRESSION: Degenerative changes in the lower cervical spine. No acute bony abnormality. Electronically Signed   By: Rolm Baptise M.D.   On: 11/19/2021 17:33   CT HEAD WO CONTRAST (5MM)  Result Date:  11/19/2021 CLINICAL DATA:  Fall EXAM: CT HEAD WITHOUT CONTRAST TECHNIQUE: Contiguous axial images were obtained from the base of the skull through the vertex without intravenous contrast. RADIATION DOSE REDUCTION: This exam was performed according to the departmental dose-optimization program which includes automated exposure control, adjustment of the mA and/or kV according to patient size and/or use of iterative reconstruction technique. COMPARISON:  None Available. FINDINGS: Brain: No acute intracranial abnormality. Specifically, no hemorrhage, hydrocephalus, mass lesion, acute infarction, or significant intracranial injury. Vascular: No hyperdense vessel or unexpected calcification. Skull: No acute calvarial abnormality. Sinuses/Orbits: No acute findings Other: None IMPRESSION: Normal study. Electronically Signed   By: Rolm Baptise M.D.   On: 11/19/2021 17:32   DG Hip Unilat W or Wo Pelvis 2-3 Views Right  Result Date: 11/19/2021 CLINICAL DATA:  Hip numbness.  Fall. EXAM: DG HIP (WITH OR WITHOUT PELVIS) 2-3V RIGHT COMPARISON:  09/01/2021 FINDINGS: Mild degenerative changes in the right hip with joint space narrowing and early spurring. Left hip joint space maintained. SI joints symmetric and unremarkable. No acute bony abnormality. Specifically, no fracture, subluxation, or dislocation. Prior sacroplasty changes again noted, stable. IMPRESSION: No acute bony abnormality. Electronically Signed   By: Rolm Baptise M.D.   On: 11/19/2021 17:22   DG PAIN CLINIC C-ARM 1-60 MIN NO REPORT  Result Date: 11/19/2021 Fluoro was used, but no Radiologist interpretation will be provided. Please refer to "NOTES" tab for provider progress note.   PROCEDURES and INTERVENTIONS:  Procedures  Medications - No data to display   IMPRESSION / MDM / [place] / ED COURSE  I reviewed the triage vital signs and the nursing notes.  Differential diagnosis includes, but is not limited to, lumbar fracture, cauda  equina, radiculopathy  {Patient presents with symptoms of an acute illness or injury that is potentially life-threatening.  71 year old female presents with resolving numbness in an L4 distribution after an L4 steroid injection, likely a consequence of the injection she had earlier today, resolving by the time I see here and suitable for continued outpatient management.  She looks systemically well without evidence of neurologic or vascular deficits.  She gestures to an L4 distribution going over her right leg and knee to the area of numbness she had earlier, now just with minor paresthesias.  Sensation is intact to my examination.  Has a small close hematoma to her head but CT head and neck are reassuring without evidence of intracranial pathology or cervical fracture.  Plain films of her pelvis and hip are similarly reassuring.  I considered MRI lumbar spine or observation admission for this patient, but with her improving symptoms and with shared decision making we decided to pursue outpatient management.  We discussed return precautions.  FINAL CLINICAL IMPRESSION(S) / ED DIAGNOSES   Final diagnoses:  Lumbar radiculopathy     Rx / DC Orders   ED Discharge Orders     None        Note:  This document was prepared using Dragon voice recognition software and may include unintentional dictation errors.   Vladimir Crofts, MD 11/19/21 2239

## 2021-11-19 NOTE — ED Triage Notes (Signed)
Pt comes via EMs from [place]. Pt states she had pain management shot today. Following the shot pt had right hip numbness down leg. Pt informed pain clinic. Pt went told to give 24 hours. Pt went to eat and since has not been able to feel her that area.  BP-152/72 HR-80 O2-96 CBG-284

## 2021-11-19 NOTE — ED Notes (Signed)
Pt to ED for fall that occurred today, states she hit head on floor, not on blood thinner. Pt also having hip pain due to shot she had today.  Pt is A&OX4.

## 2021-11-19 NOTE — Patient Instructions (Signed)
____________________________________________________________________________________________  Virtual Visits   What is a "Virtual Visit"? It is a healthcare communication encounter (medical visit) that takes place on real time (NOT TEXT or E-MAIL) over the telephone or computer device (desktop, laptop, tablet, smart phone, etc.). It allows for more location flexibility between the patient and the healthcare provider.  Who decides when these types of visits will be used? The physician.  Who is eligible for these types of visits? Only those patients that can be reliably reached over the telephone.  What do you mean by reliably? We do not have time to call everyone multiple times, therefore those that tend to screen calls and then call back later are not suitable candidates for this system. We understand how people are reluctant to pickup on "unknown" calls, therefore, we suggest adding our telephone numbers to your list of "CONTACT(s)". This way, you should be able to readily identify our calls when you receive one. All of our numbers are available below.   Who is not eligible? This option is not available for medication management encounters, specially for controlled substances. Patients on pain medications that fall under the category of controlled substances have to come in for "Face-to-Face" encounters. This is required for mandatory monitoring of these substances. You may be asked to provide a sample for an unannounced urine drug screening test (UDS), and we will need to count your pain pills. Not bringing your pills to be counted may result in no refill. Obviously, neither one of these can be done over the phone.  When will this type of visits be used? You can request a virtual visit whenever you are physically unable to attend a regular appointment. The decision will be made by the physician (or healthcare provider) on a case by case basis.   At what time will I be called? This is an  excellent question. The providers will try to call you whenever they have time available. Do not expect to be called at any specific time. The secretaries will assign you a time for your virtual visit appointment, but this is done simply to keep a list of those patients that need to be called, but not for the purpose of keeping a time schedule. Be advised that the call may come in anytime during the day, between the hours of 8:00 AM and 8::00 PM, depending on provider availability. We do understand that the system is not perfect. If you are unable to be available that day on a moments notice, then request an "in-person" appointment rather than a "virtual visit".  Can I request my medication visits to be "Virtual"? Yes you may request it, but the decision is entirely up to the healthcare provider. Control substances require specific monitoring that requires Face-to-Face encounters. The number of encounters  and the extent of the monitoring is determined on a case by case basis.  Add a new contact to your smart phone and label it "PAIN CLINIC" Under this contact add the following numbers: Main: [phone] (Official Contact Number) Nurses: [phone] (These are outgoing only calling systems. Do not call this number.) Dr. Dayln Tugwell: [phone] or [phone] (Outgoing calls only. Do not call this number.)  ____________________________________________________________________________________________   ____________________________________________________________________________________________  Post-Procedure Discharge Instructions  Instructions: Apply ice:  Purpose: This will minimize any swelling and discomfort after procedure.  When: Day of procedure, as soon as you get home. How: Fill a plastic sandwich bag with crushed ice. Cover it with a small towel and apply to   injection site. How long: (15 min on, 15 min off) Apply for 15 minutes then remove x 15 minutes.  Repeat sequence on day  of procedure, until you go to bed. Apply heat:  Purpose: To treat any soreness and discomfort from the procedure. When: Starting the next day after the procedure. How: Apply heat to procedure site starting the day following the procedure. How long: May continue to repeat daily, until discomfort goes away. Food intake: Start with clear liquids (like water) and advance to regular food, as tolerated.  Physical activities: Keep activities to a minimum for the first 8 hours after the procedure. After that, then as tolerated. Driving: If you have received any sedation, be responsible and do not drive. You are not allowed to drive for 24 hours after having sedation. Blood thinner: (Applies only to those taking blood thinners) You may restart your blood thinner 6 hours after your procedure. Insulin: (Applies only to Diabetic patients taking insulin) As soon as you can eat, you may resume your normal dosing schedule. Infection prevention: Keep procedure site clean and dry. Shower daily and clean area with soap and water. Post-procedure Pain Diary: Extremely important that this be done correctly and accurately. Recorded information will be used to determine the next step in treatment. For the purpose of accuracy, follow these rules: Evaluate only the area treated. Do not report or include pain from an untreated area. For the purpose of this evaluation, ignore all other areas of pain, except for the treated area. After your procedure, avoid taking a long nap and attempting to complete the pain diary after you wake up. Instead, set your alarm clock to go off every hour, on the hour, for the initial 8 hours after the procedure. Document the duration of the numbing medicine, and the relief you are getting from it. Do not go to sleep and attempt to complete it later. It will not be accurate. If you received sedation, it is likely that you were given a medication that may cause amnesia. Because of this, completing  the diary at a later time may cause the information to be inaccurate. This information is needed to plan your care. Follow-up appointment: Keep your post-procedure follow-up evaluation appointment after the procedure (usually 2 weeks for most procedures, 6 weeks for radiofrequencies). DO NOT FORGET to bring you pain diary with you.   Expect: (What should I expect to see with my procedure?) From numbing medicine (AKA: Local Anesthetics): Numbness or decrease in pain. You may also experience some weakness, which if present, could last for the duration of the local anesthetic. Onset: Full effect within 15 minutes of injected. Duration: It will depend on the type of local anesthetic used. On the average, 1 to 8 hours.  From steroids (Applies only if steroids were used): Decrease in swelling or inflammation. Once inflammation is improved, relief of the pain will follow. Onset of benefits: Depends on the amount of swelling present. The more swelling, the longer it will take for the benefits to be seen. In some cases, up to 10 days. Duration: Steroids will stay in the system x 2 weeks. Duration of benefits will depend on multiple posibilities including persistent irritating factors. Side-effects: If present, they may typically last 2 weeks (the duration of the steroids). Frequent: Cramps (if they occur, drink Gatorade and take over-the-counter Magnesium 450-500 mg once to twice a day); water retention with temporary weight gain; increases in blood sugar; decreased immune system response; increased appetite. Occasional: Facial flushing (red,   warm cheeks); mood swings; menstrual changes. Uncommon: Long-term decrease or suppression of natural hormones; bone thinning. (These are more common with higher doses or more frequent use. This is why we prefer that our patients avoid having any injection therapies in other practices.)  Very Rare: Severe mood changes; psychosis; aseptic necrosis. From procedure: Some  discomfort is to be expected once the numbing medicine wears off. This should be minimal if ice and heat are applied as instructed.  Call if: (When should I call?) You experience numbness and weakness that gets worse with time, as opposed to wearing off. New onset bowel or bladder incontinence. (Applies only to procedures done in the spine)  Emergency Numbers: Durning business hours (Monday - Thursday, 8:00 AM - 4:00 PM) (Friday, 9:00 AM - 12:00 Noon): [phone] After hours: [phone] NOTE: If you are having a problem and are unable connect with, or to talk to a provider, then go to your nearest urgent care or emergency department. If the problem is serious and urgent, please call 911. ____________________________________________________________________________________________   

## 2021-11-20 ENCOUNTER — Observation Stay (HOSPITAL_COMMUNITY)
Admission: EM | Admit: 2021-11-20 | Discharge: 2021-11-22 | Disposition: A | Discharge status: Home / Self Care | Payer: PPO | Attending: Family Medicine | Admitting: Family Medicine

## 2021-11-20 ENCOUNTER — Emergency Department (HOSPITAL_COMMUNITY): Payer: PPO

## 2021-11-20 ENCOUNTER — Emergency Department
Admission: EM | Admit: 2021-11-20 | Discharge: 2021-11-20 | Discharge status: Against Medical Advice | Payer: PPO | Attending: Emergency Medicine | Admitting: Emergency Medicine

## 2021-11-20 ENCOUNTER — Other Ambulatory Visit: Payer: Self-pay

## 2021-11-20 ENCOUNTER — Telehealth: Payer: Self-pay

## 2021-11-20 ENCOUNTER — Encounter (HOSPITAL_COMMUNITY): Payer: Self-pay

## 2021-11-20 ENCOUNTER — Telehealth: Payer: Self-pay | Admitting: Pain Medicine

## 2021-11-20 DIAGNOSIS — Z79899 Other long term (current) drug therapy: Secondary | ICD-10-CM | POA: Insufficient documentation

## 2021-11-20 DIAGNOSIS — Z91040 Latex allergy status: Secondary | ICD-10-CM | POA: Insufficient documentation

## 2021-11-20 DIAGNOSIS — E161 Other hypoglycemia: Secondary | ICD-10-CM | POA: Diagnosis not present

## 2021-11-20 DIAGNOSIS — Z86711 Personal history of pulmonary embolism: Secondary | ICD-10-CM | POA: Diagnosis not present

## 2021-11-20 DIAGNOSIS — K56609 Unspecified intestinal obstruction, unspecified as to partial versus complete obstruction: Secondary | ICD-10-CM | POA: Diagnosis not present

## 2021-11-20 DIAGNOSIS — Z7901 Long term (current) use of anticoagulants: Secondary | ICD-10-CM | POA: Insufficient documentation

## 2021-11-20 DIAGNOSIS — R109 Unspecified abdominal pain: Secondary | ICD-10-CM | POA: Diagnosis not present

## 2021-11-20 DIAGNOSIS — K6389 Other specified diseases of intestine: Secondary | ICD-10-CM | POA: Diagnosis not present

## 2021-11-20 DIAGNOSIS — Z5321 Procedure and treatment not carried out due to patient leaving prior to being seen by health care provider: Secondary | ICD-10-CM | POA: Insufficient documentation

## 2021-11-20 DIAGNOSIS — R1013 Epigastric pain: Secondary | ICD-10-CM | POA: Diagnosis not present

## 2021-11-20 DIAGNOSIS — Z86718 Personal history of other venous thrombosis and embolism: Secondary | ICD-10-CM

## 2021-11-20 DIAGNOSIS — E1165 Type 2 diabetes mellitus with hyperglycemia: Secondary | ICD-10-CM | POA: Diagnosis present

## 2021-11-20 DIAGNOSIS — I110 Hypertensive heart disease with heart failure: Secondary | ICD-10-CM | POA: Insufficient documentation

## 2021-11-20 DIAGNOSIS — I10 Essential (primary) hypertension: Secondary | ICD-10-CM | POA: Diagnosis present

## 2021-11-20 DIAGNOSIS — R1110 Vomiting, unspecified: Secondary | ICD-10-CM | POA: Diagnosis not present

## 2021-11-20 DIAGNOSIS — R791 Abnormal coagulation profile: Secondary | ICD-10-CM | POA: Diagnosis not present

## 2021-11-20 DIAGNOSIS — R110 Nausea: Secondary | ICD-10-CM | POA: Diagnosis not present

## 2021-11-20 DIAGNOSIS — R112 Nausea with vomiting, unspecified: Secondary | ICD-10-CM | POA: Diagnosis not present

## 2021-11-20 DIAGNOSIS — Z7984 Long term (current) use of oral hypoglycemic drugs: Secondary | ICD-10-CM | POA: Diagnosis not present

## 2021-11-20 DIAGNOSIS — G894 Chronic pain syndrome: Secondary | ICD-10-CM | POA: Diagnosis not present

## 2021-11-20 DIAGNOSIS — I509 Heart failure, unspecified: Secondary | ICD-10-CM | POA: Diagnosis not present

## 2021-11-20 DIAGNOSIS — Z7985 Long-term (current) use of injectable non-insulin antidiabetic drugs: Secondary | ICD-10-CM | POA: Diagnosis not present

## 2021-11-20 DIAGNOSIS — Z4682 Encounter for fitting and adjustment of non-vascular catheter: Secondary | ICD-10-CM | POA: Diagnosis not present

## 2021-11-20 DIAGNOSIS — I700 Atherosclerosis of aorta: Secondary | ICD-10-CM | POA: Diagnosis not present

## 2021-11-20 DIAGNOSIS — K760 Fatty (change of) liver, not elsewhere classified: Secondary | ICD-10-CM | POA: Diagnosis not present

## 2021-11-20 DIAGNOSIS — E162 Hypoglycemia, unspecified: Secondary | ICD-10-CM | POA: Diagnosis not present

## 2021-11-20 LAB — CBC WITH DIFFERENTIAL/PLATELET
Abs Immature Granulocytes: 0.03 10*3/uL (ref 0.00–0.07)
Basophils Absolute: 0 10*3/uL (ref 0.0–0.1)
Basophils Relative: 0 %
Eosinophils Absolute: 0 10*3/uL (ref 0.0–0.5)
Eosinophils Relative: 0 %
HCT: 41.6 % (ref 36.0–46.0)
Hemoglobin: 14 g/dL (ref 12.0–15.0)
Immature Granulocytes: 0 %
Lymphocytes Relative: 8 %
Lymphs Abs: 0.8 10*3/uL (ref 0.7–4.0)
MCH: 30.8 pg (ref 26.0–34.0)
MCHC: 33.7 g/dL (ref 30.0–36.0)
MCV: 91.4 fL (ref 80.0–100.0)
Monocytes Absolute: 0.8 10*3/uL (ref 0.1–1.0)
Monocytes Relative: 7 %
Neutro Abs: 9.2 10*3/uL — ABNORMAL HIGH (ref 1.7–7.7)
Neutrophils Relative %: 85 %
Platelets: 198 10*3/uL (ref 150–400)
RBC: 4.55 MIL/uL (ref 3.87–5.11)
RDW: 14.3 % (ref 11.5–15.5)
WBC: 10.8 10*3/uL — ABNORMAL HIGH (ref 4.0–10.5)
nRBC: 0 % (ref 0.0–0.2)

## 2021-11-20 LAB — COMPREHENSIVE METABOLIC PANEL
ALT: 32 U/L (ref 0–44)
ALT: 31 U/L (ref 0–44)
AST: 25 U/L (ref 15–41)
AST: 25 U/L (ref 15–41)
Albumin: 3.9 g/dL (ref 3.5–5.0)
Albumin: 3.4 g/dL — ABNORMAL LOW (ref 3.5–5.0)
Alkaline Phosphatase: 41 U/L (ref 38–126)
Alkaline Phosphatase: 38 U/L (ref 38–126)
Anion gap: 10 (ref 5–15)
Anion gap: 10 (ref 5–15)
BUN: 14 mg/dL (ref 8–23)
BUN: 14 mg/dL (ref 8–23)
CO2: 23 mmol/L (ref 22–32)
CO2: 24 mmol/L (ref 22–32)
Calcium: 8.7 mg/dL — ABNORMAL LOW (ref 8.9–10.3)
Calcium: 8.7 mg/dL — ABNORMAL LOW (ref 8.9–10.3)
Chloride: 106 mmol/L (ref 98–111)
Chloride: 107 mmol/L (ref 98–111)
Creatinine, Ser: 0.76 mg/dL (ref 0.44–1.00)
Creatinine, Ser: 0.81 mg/dL (ref 0.44–1.00)
GFR, Estimated: >60 mL/min (ref >60)
GFR, Estimated: >60 mL/min (ref >60)
Glucose, Bld: 193 mg/dL — ABNORMAL HIGH (ref 70–99)
Glucose, Bld: 236 mg/dL — ABNORMAL HIGH (ref 70–99)
Potassium: 3.3 mmol/L — ABNORMAL LOW (ref 3.5–5.1)
Potassium: 3.5 mmol/L (ref 3.5–5.1)
Sodium: 139 mmol/L (ref 135–145)
Sodium: 141 mmol/L (ref 135–145)
Total Bilirubin: 1 mg/dL (ref 0.3–1.2)
Total Bilirubin: 1 mg/dL (ref 0.3–1.2)
Total Protein: 7 g/dL (ref 6.5–8.1)
Total Protein: 6.5 g/dL (ref 6.5–8.1)

## 2021-11-20 LAB — CBC
HCT: 44.1 % (ref 36.0–46.0)
Hemoglobin: 14.4 g/dL (ref 12.0–15.0)
MCH: 29.5 pg (ref 26.0–34.0)
MCHC: 32.7 g/dL (ref 30.0–36.0)
MCV: 90.4 fL (ref 80.0–100.0)
Platelets: 222 10*3/uL (ref 150–400)
RBC: 4.88 MIL/uL (ref 3.87–5.11)
RDW: 14.3 % (ref 11.5–15.5)
WBC: 11.4 10*3/uL — ABNORMAL HIGH (ref 4.0–10.5)
nRBC: 0 % (ref 0.0–0.2)

## 2021-11-20 LAB — PROTIME-INR
INR: 1 (ref 0.8–1.2)
Prothrombin Time: 13.2 seconds (ref 11.4–15.2)

## 2021-11-20 LAB — LIPASE, BLOOD
Lipase: 24 U/L (ref 11–51)
Lipase: 22 U/L (ref 11–51)

## 2021-11-20 LAB — TROPONIN I (HIGH SENSITIVITY): Troponin I (High Sensitivity): 5 ng/L (ref <18)

## 2021-11-20 MED ORDER — IOHEXOL 300 MG/ML  SOLN
100.0000 mL | Freq: Once | INTRAMUSCULAR | Status: AC | PRN
  Administered 2021-11-20: 100 mL via INTRAVENOUS

## 2021-11-20 NOTE — Telephone Encounter (Signed)
Post procedure phone call.  Patient states that she went to [employer] and her leg went completely numb and she fell backwards and hit her head and went to ED via EMS.  States her CT was negative and she has feeling in her leg now.  States her hand is bruised but feeling better.

## 2021-11-20 NOTE — ED Triage Notes (Signed)
Pt coming from home via EMS. Was seen here yesterday for a fall and discharged. Pt now complains of N/V since this morning. Complains of epigastric pain. Pt given 4mg  of IV zofran with EMS

## 2021-11-20 NOTE — Telephone Encounter (Signed)
See previous nopte

## 2021-11-20 NOTE — Telephone Encounter (Signed)
Patient lvmail at [phone] 11-19-21. Stating she called and asked about the numbness in her leg yesterday. She went to eat at [employer] with her friend and when she got up she fell due to numbness in her leg and fell on her back and hit her head. She went to the ED after the fall to be checked out and thought we should know what happened.please call patient

## 2021-11-20 NOTE — ED Provider Triage Note (Signed)
Emergency Medicine Provider Triage Evaluation Note  Kara [name] , a 71 y.o. female  was evaluated in triage.  Pt complains of abdominal pain and vomiting, constipation.  Patient is anticoagulated on Coumadin.  She reports having a fall yesterday after receiving an injection in her back.  She was seen and had a CT of her head and cervical spine which were negative.  She denies severe headache.  Nausea and vomiting began this morning.  She was concerned about a concussion but her abdominal pain worsened.  Review of Systems  Positive: Abdominal pain, vomiting Negative: Fever  Physical Exam  BP (!) 187/99 (BP Location: Right Arm)   Pulse 89   Temp (!) 97 F (36.1 C) (Axillary)   Resp 20   Ht 5\' 3"  (1.6 m)   Wt 90.7 kg   SpO2 100%   BMI 35.43 kg/m  Gen:   Awake, appears uncomfortable Resp:  Normal effort  MSK:   Moves extremities without difficulty  Other:    Medical Decision Making  Medically screening exam initiated at 10:57 PM.  Appropriate orders placed.  Genice Rouge [place] was informed that the remainder of the evaluation will be completed by another provider, this initial triage assessment does not replace that evaluation, and the importance of remaining in the ED until their evaluation is complete.     Carlisle Cater, PA-C 11/20/21 2258

## 2021-11-20 NOTE — ED Notes (Signed)
Pt states she is leaving due to wait, pt states she cannot wait due to pain, at this time no recliners and no beds are available for pt to lie down.

## 2021-11-20 NOTE — ED Triage Notes (Signed)
Patient fell yesterday at [employer], went to the emergency room after, did not break any bones and had a CT of the head that was negative. Has had a headache since and feels nauseous.

## 2021-11-21 ENCOUNTER — Encounter (HOSPITAL_COMMUNITY): Payer: Self-pay | Admitting: Internal Medicine

## 2021-11-21 ENCOUNTER — Emergency Department (HOSPITAL_COMMUNITY): Payer: PPO

## 2021-11-21 ENCOUNTER — Inpatient Hospital Stay (HOSPITAL_COMMUNITY): Payer: PPO

## 2021-11-21 DIAGNOSIS — K6389 Other specified diseases of intestine: Secondary | ICD-10-CM | POA: Diagnosis not present

## 2021-11-21 DIAGNOSIS — I10 Essential (primary) hypertension: Secondary | ICD-10-CM | POA: Diagnosis not present

## 2021-11-21 DIAGNOSIS — Z86718 Personal history of other venous thrombosis and embolism: Secondary | ICD-10-CM

## 2021-11-21 DIAGNOSIS — R791 Abnormal coagulation profile: Secondary | ICD-10-CM

## 2021-11-21 DIAGNOSIS — K56609 Unspecified intestinal obstruction, unspecified as to partial versus complete obstruction: Secondary | ICD-10-CM | POA: Diagnosis not present

## 2021-11-21 DIAGNOSIS — K5669 Other partial intestinal obstruction: Secondary | ICD-10-CM | POA: Diagnosis not present

## 2021-11-21 DIAGNOSIS — G894 Chronic pain syndrome: Secondary | ICD-10-CM | POA: Diagnosis not present

## 2021-11-21 DIAGNOSIS — Z4682 Encounter for fitting and adjustment of non-vascular catheter: Secondary | ICD-10-CM | POA: Diagnosis not present

## 2021-11-21 LAB — CBC
HCT: 43.7 % (ref 36.0–46.0)
Hemoglobin: 14.1 g/dL (ref 12.0–15.0)
MCH: 30.1 pg (ref 26.0–34.0)
MCHC: 32.3 g/dL (ref 30.0–36.0)
MCV: 93.4 fL (ref 80.0–100.0)
Platelets: 206 10*3/uL (ref 150–400)
RBC: 4.68 MIL/uL (ref 3.87–5.11)
RDW: 14.9 % (ref 11.5–15.5)
WBC: 9 10*3/uL (ref 4.0–10.5)
nRBC: 0 % (ref 0.0–0.2)

## 2021-11-21 LAB — CBG MONITORING, ED
Glucose-Capillary: 182 mg/dL — ABNORMAL HIGH (ref 70–99)
Glucose-Capillary: 164 mg/dL — ABNORMAL HIGH (ref 70–99)

## 2021-11-21 LAB — HEPARIN LEVEL (UNFRACTIONATED)
Heparin Unfractionated: 0.38 IU/mL (ref 0.30–0.70)
Heparin Unfractionated: 0.29 IU/mL — ABNORMAL LOW (ref 0.30–0.70)

## 2021-11-21 LAB — GLUCOSE, CAPILLARY
Glucose-Capillary: 119 mg/dL — ABNORMAL HIGH (ref 70–99)
Glucose-Capillary: 139 mg/dL — ABNORMAL HIGH (ref 70–99)
Glucose-Capillary: 168 mg/dL — ABNORMAL HIGH (ref 70–99)

## 2021-11-21 LAB — LACTIC ACID, PLASMA: Lactic Acid, Venous: 1.7 mmol/L (ref 0.5–1.9)

## 2021-11-21 MED ORDER — HEPARIN BOLUS VIA INFUSION
2000.0000 [IU] | Freq: Once | INTRAVENOUS | Status: AC
  Administered 2021-11-21: 2000 [IU] via INTRAVENOUS
  Filled 2021-11-21: qty 2000

## 2021-11-21 MED ORDER — HYDRALAZINE HCL 20 MG/ML IJ SOLN: 5.0000 mg | Freq: Three times a day (TID) | INTRAMUSCULAR | Status: DC | PRN

## 2021-11-21 MED ORDER — POTASSIUM CHLORIDE IN NACL 20-0.9 MEQ/L-% IV SOLN
INTRAVENOUS | Status: DC
  Filled 2021-11-21 (×4): qty 1000

## 2021-11-21 MED ORDER — ACETAMINOPHEN 650 MG RE SUPP: 650.0000 mg | Freq: Four times a day (QID) | RECTAL | Status: DC | PRN

## 2021-11-21 MED ORDER — ACETAMINOPHEN 325 MG PO TABS
650.0000 mg | ORAL_TABLET | Freq: Four times a day (QID) | ORAL | Status: DC | PRN
  Administered 2021-11-22: 650 mg
  Filled 2021-11-21: qty 2

## 2021-11-21 MED ORDER — INSULIN ASPART 100 UNIT/ML IJ SOLN
0.0000 [IU] | INTRAMUSCULAR | Status: DC
  Administered 2021-11-21 (×3): 2 [IU] via SUBCUTANEOUS
  Administered 2021-11-22: 1 [IU] via SUBCUTANEOUS
  Administered 2021-11-22: 2 [IU] via SUBCUTANEOUS
  Administered 2021-11-22: 1 [IU] via SUBCUTANEOUS
  Administered 2021-11-22: 2 [IU] via SUBCUTANEOUS
  Filled 2021-11-21: qty 0.09

## 2021-11-21 MED ORDER — HEPARIN (PORCINE) 25000 UT/250ML-% IV SOLN
1200.0000 [IU]/h | INTRAVENOUS | Status: DC
Start: 2021-11-21 — End: 2021-11-21
  Administered 2021-11-21 (×2): 1200 [IU]/h via INTRAVENOUS
  Filled 2021-11-21 (×2): qty 250

## 2021-11-21 MED ORDER — DIATRIZOATE MEGLUMINE & SODIUM 66-10 % PO SOLN
90.0000 mL | Freq: Once | ORAL | Status: AC
  Administered 2021-11-21: 90 mL via NASOGASTRIC
  Filled 2021-11-21: qty 90

## 2021-11-21 MED ORDER — ONDANSETRON HCL 4 MG/2ML IJ SOLN: 4.0000 mg | Freq: Four times a day (QID) | INTRAMUSCULAR | Status: DC | PRN

## 2021-11-21 MED ORDER — HYDROMORPHONE HCL 1 MG/ML IJ SOLN
0.5000 mg | INTRAMUSCULAR | Status: DC | PRN
  Administered 2021-11-21 – 2021-11-22 (×5): 0.5 mg via INTRAVENOUS
  Filled 2021-11-21: qty 0.5
  Filled 2021-11-21: qty 1
  Filled 2021-11-21 (×3): qty 0.5

## 2021-11-21 MED ORDER — ONDANSETRON HCL 4 MG PO TABS: 4.0000 mg | ORAL_TABLET | Freq: Four times a day (QID) | ORAL | Status: DC | PRN

## 2021-11-21 MED ORDER — ONDANSETRON HCL 4 MG/2ML IJ SOLN
4.0000 mg | Freq: Once | INTRAMUSCULAR | Status: AC
  Administered 2021-11-21: 4 mg via INTRAVENOUS
  Filled 2021-11-21: qty 2

## 2021-11-21 MED ORDER — HYDROMORPHONE HCL 1 MG/ML IJ SOLN
1.0000 mg | Freq: Once | INTRAMUSCULAR | Status: AC
  Administered 2021-11-21: 1 mg via INTRAVENOUS
  Filled 2021-11-21: qty 1

## 2021-11-21 MED ORDER — HEPARIN (PORCINE) 25000 UT/250ML-% IV SOLN
1250.0000 [IU]/h | INTRAVENOUS | Status: DC
  Administered 2021-11-21: 1250 [IU]/h via INTRAVENOUS
  Filled 2021-11-21: qty 250

## 2021-11-21 NOTE — Assessment & Plan Note (Signed)
-   Continue heparin 

## 2021-11-21 NOTE — Assessment & Plan Note (Signed)
-   Hold warfarin until able to take p.o. - Continue heparin drip

## 2021-11-21 NOTE — Progress Notes (Signed)
ANTICOAGULATION CONSULT NOTE - Follow Up Consult  Pharmacy Consult for Heparin IV Indication: History of PE (warfarin on hold)  Allergies  Allergen Reactions   Adhesive [Tape] Other (See Comments)    Pt reports, when removing tape from skin most tapes pull her skin off. Ok to use paper tape   Other Hives    Chlorhexadine wipes/CHG wipes,    Latex Rash    Exam gloves/ does not react around elastic and lips don't swell when blowing balloons   Morphine And Related Nausea And Vomiting    Patient Measurements: Height: 5\' 3"  (160 cm) Weight: 90.7 kg (200 lb) IBW/kg (Calculated) : 52.4 Heparin Dosing Weight: 73.1 kg  Vital Signs: Temp: 98.3 F (36.8 C) (07/01 2111) Temp Source: Oral (07/01 2111) BP: 137/78 (07/01 2111) Pulse Rate: 89 (07/01 2111)  Labs: Recent Labs    11/20/21 2122 11/20/21 2320 11/21/21 1417 11/21/21 2148  HGB 14.4 14.0 14.1  --   HCT 44.1 41.6 43.7  --   PLT 222 198 206  --   LABPROT  --  13.2  --   --   INR  --  1.0  --   --   HEPARINUNFRC  --   --  0.38 0.29*  CREATININE 0.76 0.81  --   --   TROPONINIHS 5  --   --   --      Estimated Creatinine Clearance: 69.1 mL/min (by C-G formula based on SCr of 0.81 mg/dL). 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  Presents with SBO, NGT in place.  Pharmacy consulted to dose IV heparin.   Home dose of warfarin reported as 6mg  Mon/Tues/Wed/Thurs; 7mg  Fri/Sat/Sun - last dose taken 6/22.  INR = 1.0  11/21/2021 Confirmatory Heparin level 0.29 - slightly low on heparin 1200 units/hr CBC: Hgb and Plt WNL No bleeding or complications reported.   Goal of Therapy:  Heparin level 0.3-0.7 units/ml Monitor platelets by anticoagulation protocol: Yes   Plan:  increase heparin IV infusion to 1250 units/hr Recheck Heparin level in 8 hours. Daily heparin level and CBC  Eudelia Bunch, Pharm.D 11/21/2021 11:00 PM

## 2021-11-21 NOTE — H&P (Signed)
History and Physical    Patient: Kara [name] JXB:[id number] DOB: [birth year] DOA: 11/20/2021 DOS: the patient was seen and examined on 11/21/2021 PCP: Tracie Harrier, MD  Patient coming from: Home  Chief Complaint:  Chief Complaint  Patient presents with   Nausea       HPI:  Kara [name] is a 71 y.o. F with hx PE on warfarin, HTN, DM, obesity BMI 35, and hx Hartmann's for recurrent diverticulitis who presented after a fall.  Had an epidural injection at L4 the day prior admission, afterwards, went to [employer], there her leg went numb in an L4 distribution, she fell, hit her head.  In the ER, CTH normal, she was discharged but over the course of the day developed worsening abdominal pain and vomiting.    Returned to ER where further imaging showed SBO and so an NG tube was placed.      Review of Systems  Constitutional:  Negative for chills, fever and malaise/fatigue.  Gastrointestinal:  Positive for abdominal pain, nausea and vomiting. Negative for blood in stool, constipation and diarrhea.  Genitourinary:  Negative for dysuria, flank pain, frequency, hematuria and urgency.  All other systems reviewed and are negative.    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, 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   CHF (congestive heart failure) ([place])    followed colon resection, "wouldn't let me get up"   Diabetes ([place])    type 2   Diverticulitis 2018   History of being hospitalized    1 month ago for 3 days, vomiting, and kink in upper intestine   History of hiatal hernia    Hypertension    Osteoporosis    PE (pulmonary thromboembolism) ([place])    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[place])    Vertigo    Past Surgical History:  Procedure Laterality Date   APPENDECTOMY N/A 02/24/2017   Procedure: Incidental  APPENDECTOMY;  Surgeon: [name], MD;  Location: [name];  Service: General;  Laterality: N/A;    CARPAL TUNNEL RELEASE Bilateral    CATARACT EXTRACTION W/PHACO Left 11/21/2018   Procedure: CATARACT EXTRACTION PHACO AND INTRAOCULAR LENS PLACEMENT ([place]) LEFT DIABETES;  Surgeon: Birder Robson, MD;  Location: [place];  Service: Ophthalmology;  Laterality: Left;  latex sensitivity Diabetic - oral meds   CATARACT EXTRACTION W/PHACO Right 12/12/2018   Procedure: CATARACT EXTRACTION PHACO AND INTRAOCULAR LENS PLACEMENT ([place]) RIGHT DIABETES;  Surgeon: Birder Robson, MD;  Location: [place];  Service: Ophthalmology;  Laterality: Right;  Diabetes-oral med Latex sensitiviy   COLON RESECTION SIGMOID N/A 11/02/2016   Procedure: COLON RESECTION SIGMOID;  Surgeon: [name], MD;  Location: [name];  Service: General;  Laterality: N/A;   COLON SURGERY  11/02/2016   COLONOSCOPY WITH PROPOFOL N/A 02/03/2017   Procedure: COLONOSCOPY WITH PROPOFOL;  Surgeon: Lin Landsman, MD;  Location: [facility];  Service: Gastroenterology;  Laterality: N/A;   COLOSTOMY N/A 11/02/2016   Procedure: COLOSTOMY;  Surgeon: [name], MD;  Location: [name];  Service: General;  Laterality: N/A;   COLOSTOMY REVERSAL N/A 02/24/2017   Procedure: COLOSTOMY REVERSAL, ostomy takedown, spleenic flexure mobilization, excision rectal stump/distal sigmoid, anastomosis with suture reinforcement;  Surgeon: [name], MD;  Location: [name];  Service: General;  Laterality: N/A;   ILEO LOOP DIVERSION N/A 02/24/2017   Procedure: ILEO LOOP COLOSTOMY;  Surgeon: [name], MD;  Location: [name];  Service: General;  Laterality: N/A;   ILEOSTOMY CLOSURE N/A 06/01/2017   Procedure: LOOP ILEOSTOMY TAKEDOWN;  Surgeon: [name], MD;  Location: [name];  Service: General;  Laterality: N/A;   IVC FILTER INSERTION Right 10/2016   IVC FILTER REMOVAL N/A 08/09/2017   Procedure: IVC FILTER REMOVAL;  Surgeon: Katha Cabal, MD;  Location: [place] [facility];  Service:  Cardiovascular;  Laterality: N/A;   KNEE SURGERY     torn menicus   LAPAROSCOPY N/A 02/24/2017   Procedure: LAPAROSCOPY DIAGNOSTIC;  Surgeon: [name], MD;  Location: [name];  Service: General;  Laterality: N/A;   LYSIS OF ADHESION N/A 02/24/2017   Procedure: LYSIS OF ADHESION;  Surgeon: [name], MD;  Location: [name];  Service: General;  Laterality: N/A;   PULMONARY VENOGRAPHY N/A 11/19/2016   Procedure: Pulmonary Venography; IVC filter placement; possible pulmonary thrombectomy;  Surgeon: Katha Cabal, MD;  Location: [place] [facility];  Service: Cardiovascular;  Laterality: N/A;   SACROPLASTY N/A 11/08/2017   Procedure: SACROPLASTY S2;  Surgeon: Hessie Knows, MD;  Location: [name];  Service: Orthopedics;  Laterality: N/A;   SIGMOIDOSCOPY N/A 11/13/2016   Procedure: endoscopic  flexible SIGMOIDOSCOPY;  Surgeon: Florene Glen, MD;  Location: [name];  Service: General;  Laterality: N/A;   SIGMOIDOSCOPY N/A 05/19/2017   Procedure: Beryle Quant;  Surgeon: [name], MD;  Location: [name];  Service: General;  Laterality: N/A;   VIDEO BRONCHOSCOPY WITH ENDOBRONCHIAL NAVIGATION N/A 02/23/2021   Procedure: ROBOTIC VIDEO BRONCHOSCOPY WITH ENDOBRONCHIAL NAVIGATION;  Surgeon: Tyler Pita, MD;  Location: [name];  Service: Pulmonary;  Laterality: N/A;   Social History:  reports that she has never smoked. She has never used smokeless tobacco. She reports that she does not drink alcohol and does not use drugs.  Allergies  Allergen Reactions   Adhesive [Tape] Other (See Comments)    Pt reports, when removing tape from skin most tapes pull her skin off. Ok to use paper tape   Other Hives    Chlorhexadine wipes/CHG wipes,    Latex Rash    Exam gloves/ does not react around elastic and lips don't swell when blowing balloons   Morphine And Related Nausea And Vomiting    Family History  Problem Relation Age of Onset   Diabetes Mother    Heart  disease Mother    Cancer Mother    Breast cancer Mother        >50   Heart disease Father    Diabetes Sister    Heart disease Sister     Prior to Admission medications   Medication Sig Start Date End Date Taking? Authorizing Provider  alendronate (FOSAMAX) 70 MG tablet Take 70 mg by mouth every Friday. Take with a full glass of water on an empty stomach.   Yes [provider]  candesartan (ATACAND) 16 MG tablet Take 16 mg by mouth in the morning. 08/03/17  Yes [provider]  cholecalciferol (VITAMIN D) 25 MCG (1000 UNIT) tablet Take 1,000 Units by mouth in the morning.   Yes [provider]  Cyanocobalamin (VITAMIN B-12) 5000 MCG TBDP Take 5,000 mcg by mouth in the morning.   Yes [provider]  cyclobenzaprine (FLEXERIL) 10 MG tablet Take 10 mg by mouth at bedtime. 07/05/16  Yes [provider]  Dulaglutide 1.5 MG/0.5ML SOPN Inject 1.5 mg into the skin every Saturday.   Yes [provider]  gabapentin (NEURONTIN) 100 MG capsule Take 200  mg by mouth with breakfast, with lunch, and with evening meal.   Yes [provider]  gabapentin (NEURONTIN) 300 MG capsule Take 300 mg by mouth at bedtime.   Yes [provider]  glipiZIDE (GLUCOTROL) 10 MG tablet Take 10 mg by mouth 2 (two) times daily before a meal.   Yes [provider]  HYDROcodone-acetaminophen (NORCO/VICODIN) 5-325 MG tablet Take 1 tablet by mouth every 6 (six) hours as needed for moderate pain.   Yes [provider]  meclizine (ANTIVERT) 25 MG tablet Take 25 mg by mouth 3 (three) times daily as needed for dizziness. 12/29/17  Yes [provider]  rOPINIRole (REQUIP) 0.5 MG tablet Take 0.5 mg by mouth at bedtime.   Yes [provider]  traMADol (ULTRAM) 50 MG tablet Take 50 mg by mouth at bedtime as needed for moderate pain or severe pain.   Yes [provider]  warfarin (COUMADIN) 6 MG tablet Take 6 mg by mouth See admin  instructions. Take 1 tablet (6 mg) by mouth on Mondays, Tuesdays, Wednesdays, & Thursdays Take 1 tablet (6 mg) with 1 tablet (1 mg) by mouth on Fridays, Saturdays & Sundays (7 mg)   Yes [provider]  calcium carbonate (CALCIUM 600) 600 MG TABS tablet Take 1 tablet (600 mg total) by mouth 2 (two) times daily with a meal. 02/25/20 11/10/21  Milinda Pointer, MD  Multiple Vitamin (MULTIVITAMIN WITH MINERALS) TABS tablet Take 1 tablet by mouth in the morning.    [provider]    Physical Exam: Vitals:   11/21/21 0830 11/21/21 0900 11/21/21 0930 11/21/21 1000  BP: 140/78 133/72 109/82 122/83  Pulse: 93 93 92 92  Resp: (!) 24 20 (!) 25 (!) 23  Temp:      TempSrc:      SpO2: 91% 91% 91% 93%  Weight:      Height:       This is an elderly adult female, lying in bed, interactive and appropriate NG tube in place, oropharynx moist, partially edentulous Skin normal Heart rate regular, no murmurs, rhythm normal, no peripheral edema Respiratory rate normal, lungs clear without rales or wheezes Abdomen with some voluntary guarding, tenderness with deep palpation, but not with light palpation, no rigidity or rebound, no distention, bowel sounds are present Attention normal, affect appropriate, judgment insight appear normal, face symmetric, speech fluent, moves upper extremities with normal strength and coordination     Data Reviewed: Patient metabolic panel and complete blood count reviewed and within normal limits Lactate normal Troponin normal CT abdomen and pelvis showed ventral hernia and small bowel obstruction Recent CT head and C-spine were unremarkable    Assessment and Plan: * SBO (small bowel obstruction) (HCC) CT showed SBO with transition point in the mid abdomen.  Large ventral hernia, no masses.  NG placed in the ER.  Feeling better. - Maintain NG to LIS - IV fluids - Antiemetics and analgesics as needed - Consult general surgery - Follow small bowel  protocol serial imaging     Subtherapeutic international normalized ratio (INR) - Continue heparin  History of recurrent venous thromboembolism - Hold warfarin until able to take p.o. - Continue heparin drip  Morbid obesity (HCC) BMI 35 in setting of DM, HTN, chronic joint pain  Chronic pain syndrome - Hold home tramadol, Norco, gabapentin, Flexeril until able to take p.o.  Essential hypertension - Hold home Atacand until able to take p.o. - As needed hydralazine as needed for severe range  pressures  Type 2 diabetes mellitus with hyperglycemia, without long-term current use of insulin (HCC) Glucose controlled - Hold home dulaglutide, glipizide - Start sliding scale correction insulin every 4 hours         Advance Care Planning: Full code  Consults: General surgery  Family Communication: We will call husband later this morning  Severity of Illness: The appropriate patient status for this patient is INPATIENT. Inpatient status is judged to be reasonable and necessary in order to provide the required intensity of service to ensure the patient's safety. The patient's presenting symptoms, physical exam findings, and initial radiographic and laboratory data in the context of their chronic comorbidities is felt to place them at high risk for further clinical deterioration. Furthermore, it is not anticipated that the patient will be medically stable for discharge from the hospital within 2 midnights of admission.   * I certify that at the point of admission it is my clinical judgment that the patient will require inpatient hospital care spanning beyond 2 midnights from the point of admission due to high intensity of service, high risk for further deterioration and high frequency of surveillance required.*  Author: Edwin Dada, MD 11/21/2021 10:36 AM  For on call review [email or web address].

## 2021-11-21 NOTE — Consult Note (Signed)
Consulting Physician: [place]  Referring Provider: Deno Etienne, PA-C  Chief Complaint: Nausea, vomiting, recent fall  Reason for Consult: Small bowel obstruction   Subjective   HPI: Kara [name] is an 71 y.o. female who is here for nausea, vomiting and recent fall.  She thought she had a concussion after falling over at [employer].  She had a CT of the head that looked okay.  Then she developed nausea, vomiting.  She had a normal bowel movement just prior to coming into the hospital.  CT demonstrated a bowel obstruction so surgery team was consulted.  She has a history of a Hartmann's procedure for recurrent diverticulitis with Dr. Adonis Huguenin on 11/02/2016.  This was complicated by ostomy ischemia.  Eventually she had an ostomy takedown with ileostomy creation.  And finally on 06/01/2017, loop ileostomy takedown.    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, 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   CHF (congestive heart failure) ([place])    followed colon resection, "wouldn't let me get up"   Diabetes ([place])    type 2   Diverticulitis 2018   History of being hospitalized    1 month ago for 3 days, vomiting, and kink in upper intestine   History of hiatal hernia    Hypertension    Osteoporosis    PE (pulmonary thromboembolism) ([place])    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[place])    Vertigo     Past Surgical History:  Procedure Laterality Date   APPENDECTOMY N/A 02/24/2017   Procedure: Incidental  APPENDECTOMY;  Surgeon: [name], MD;  Location: [name];  Service: General;  Laterality: N/A;   CARPAL TUNNEL RELEASE Bilateral    CATARACT EXTRACTION W/PHACO Left 11/21/2018   Procedure: CATARACT EXTRACTION PHACO AND INTRAOCULAR LENS PLACEMENT ([place]) LEFT DIABETES;  Surgeon: Birder Robson, MD;  Location: [place];  Service: Ophthalmology;  Laterality: Left;  latex sensitivity Diabetic - oral  meds   CATARACT EXTRACTION W/PHACO Right 12/12/2018   Procedure: CATARACT EXTRACTION PHACO AND INTRAOCULAR LENS PLACEMENT ([place]) RIGHT DIABETES;  Surgeon: Birder Robson, MD;  Location: [place];  Service: Ophthalmology;  Laterality: Right;  Diabetes-oral med Latex sensitiviy   COLON RESECTION SIGMOID N/A 11/02/2016   Procedure: COLON RESECTION SIGMOID;  Surgeon: [name], MD;  Location: [name];  Service: General;  Laterality: N/A;   COLON SURGERY  11/02/2016   COLONOSCOPY WITH PROPOFOL N/A 02/03/2017   Procedure: COLONOSCOPY WITH PROPOFOL;  Surgeon: Lin Landsman, MD;  [facility];  Service: Gastroenterology;  Laterality: N/A;   COLOSTOMY N/A 11/02/2016   Procedure: COLOSTOMY;  Surgeon: [name], MD;  Location: [name];  Service: General;  Laterality: N/A;   COLOSTOMY REVERSAL N/A 02/24/2017   Procedure: COLOSTOMY REVERSAL, ostomy takedown, spleenic flexure mobilization, excision rectal stump/distal sigmoid, anastomosis with suture reinforcement;  Surgeon: [name], MD;  Location: [name];  Service: General;  Laterality: N/A;   ILEO LOOP DIVERSION N/A 02/24/2017   Procedure: ILEO LOOP COLOSTOMY;  Surgeon: [name], MD;  Location: [name];  Service: General;  Laterality: N/A;   ILEOSTOMY CLOSURE N/A 06/01/2017   Procedure: LOOP ILEOSTOMY TAKEDOWN;  Surgeon: [name], MD;  Location: [name];  Service: General;  Laterality: N/A;   IVC FILTER INSERTION Right 10/2016   IVC FILTER REMOVAL N/A 08/09/2017   Procedure: IVC FILTER REMOVAL;  Surgeon: Katha Cabal, MD;  Location:  [place] [facility];  Service: Cardiovascular;  Laterality: N/A;   KNEE SURGERY     torn menicus   LAPAROSCOPY N/A 02/24/2017   Procedure: LAPAROSCOPY DIAGNOSTIC;  Surgeon: [name], MD;  Location: [name];  Service: General;  Laterality: N/A;   LYSIS OF ADHESION N/A 02/24/2017   Procedure: LYSIS OF ADHESION;  Surgeon: [name], MD;   Location: [name];  Service: General;  Laterality: N/A;   PULMONARY VENOGRAPHY N/A 11/19/2016   Procedure: Pulmonary Venography; IVC filter placement; possible pulmonary thrombectomy;  Surgeon: Katha Cabal, MD;  Location: [facility];  Service: Cardiovascular;  Laterality: N/A;   SACROPLASTY N/A 11/08/2017   Procedure: SACROPLASTY S2;  Surgeon: Hessie Knows, MD;  Location: [name];  Service: Orthopedics;  Laterality: N/A;   SIGMOIDOSCOPY N/A 11/13/2016   Procedure: endoscopic  flexible SIGMOIDOSCOPY;  Surgeon: Florene Glen, MD;  Location: [name];  Service: General;  Laterality: N/A;   SIGMOIDOSCOPY N/A 05/19/2017   Procedure: Beryle Quant;  Surgeon: [name], MD;  Location: [name];  Service: General;  Laterality: N/A;   VIDEO BRONCHOSCOPY WITH ENDOBRONCHIAL NAVIGATION N/A 02/23/2021   Procedure: ROBOTIC VIDEO BRONCHOSCOPY WITH ENDOBRONCHIAL NAVIGATION;  Surgeon: Tyler Pita, MD;  Location: [name];  Service: Pulmonary;  Laterality: N/A;    Family History  Problem Relation Age of Onset   Diabetes Mother    Heart disease Mother    Cancer Mother    Breast cancer Mother        >50   Heart disease Father    Diabetes Sister    Heart disease Sister     Social:  reports that she has never smoked. She has never used smokeless tobacco. She reports that she does not drink alcohol and does not use drugs.  Allergies:  Allergies  Allergen Reactions   Adhesive [Tape] Other (See Comments)    Pt reports, when removing tape from skin most tapes pull her skin off. Ok to use paper tape   Other Hives    Chlorhexadine wipes/CHG wipes,    Latex Rash    Exam gloves/ does not react around elastic and lips don't swell when blowing balloons   Morphine And Related Nausea And Vomiting    Medications: Current Outpatient Medications  Medication Instructions   alendronate (FOSAMAX) 70 mg, Oral, Every Fri, Take with a full glass of water on an empty stomach.    calcium carbonate (CALCIUM 600) 600 mg, Oral, 2 times daily with meals   candesartan (ATACAND) 16 mg, Oral, Every morning   cholecalciferol (VITAMIN D) 1,000 Units, Oral, Every morning   cyclobenzaprine (FLEXERIL) 10 mg, Oral, Daily at bedtime   Dulaglutide 1.5 mg, Subcutaneous, Every Sat   gabapentin (NEURONTIN) 200 mg, Oral, 3 times daily with meals   gabapentin (NEURONTIN) 300 mg, Oral, Daily at bedtime   glipiZIDE (GLUCOTROL) 10 mg, Oral, 2 times daily before meals   HYDROcodone-acetaminophen (NORCO/VICODIN) 5-325 MG tablet 1 tablet, Oral, Every 6 hours PRN   meclizine (ANTIVERT) 25 mg, Oral, 3 times daily PRN   Multiple Vitamin (MULTIVITAMIN WITH MINERALS) TABS tablet 1 tablet, Oral, Every morning   rOPINIRole (REQUIP) 0.5 mg, Oral, Daily at bedtime   traMADol (ULTRAM) 50 mg, Oral, At bedtime PRN   Vitamin B-12 5,000 mcg, Oral, Every morning   warfarin (COUMADIN) 6 mg, Oral, See admin instructions, Take 1 tablet (6 mg) by mouth on Mondays, Tuesdays, Wednesdays, & Thursdays<BR>Take 1 tablet (6 mg) with 1 tablet (1 mg) by  mouth on Fridays, Saturdays & Sundays (7 mg)    ROS - all of the below systems have been reviewed with the patient and positives are indicated with bold text General: chills, fever or night sweats Eyes: blurry vision or double vision ENT: epistaxis or sore throat Allergy/Immunology: itchy/watery eyes or nasal congestion Hematologic/Lymphatic: bleeding problems, blood clots or swollen lymph nodes Endocrine: temperature intolerance or unexpected weight changes Breast: new or changing breast lumps or nipple discharge Resp: cough, shortness of breath, or wheezing CV: chest pain or dyspnea on exertion GI: as per HPI GU: dysuria, trouble voiding, or hematuria MSK: joint pain or joint stiffness Neuro: TIA or stroke symptoms Derm: pruritus and skin lesion changes Psych: anxiety and depression  Objective   PE Blood pressure (!) 142/86, pulse 93, temperature (!) 97  F (36.1 C), temperature source Axillary, resp. rate 20, height 5\' 3"  (1.6 m), weight 90.7 kg, SpO2 91 %. Constitutional: NAD; conversant; no deformities Eyes: Moist conjunctiva; no lid lag; anicteric; PERRL Neck: Trachea midline; no thyromegaly Lungs: Normal respiratory effort; no tactile fremitus CV: RRR; no palpable thrills; no pitting edema GI: Abd Soft, nontender; no palpable hepatosplenomegaly, NG in place MSK: Normal range of motion of extremities; no clubbing/cyanosis Psychiatric: Appropriate affect; alert and oriented x3 Lymphatic: No palpable cervical or axillary lymphadenopathy  Results for orders placed or performed during the hospital encounter of 11/20/21 (from the past 24 hour(s))  CBC with Differential     Status: Abnormal   Collection Time: 11/20/21 11:20 PM  Result Value Ref Range   WBC 10.8 (H) 4.0 - 10.5 K/uL   RBC 4.55 3.87 - 5.11 MIL/uL   Hemoglobin 14.0 12.0 - 15.0 g/dL   HCT 41.6 36.0 - 46.0 %   MCV 91.4 80.0 - 100.0 fL   MCH 30.8 26.0 - 34.0 pg   MCHC 33.7 30.0 - 36.0 g/dL   RDW 14.3 11.5 - 15.5 %   Platelets 198 150 - 400 K/uL   nRBC 0.0 0.0 - 0.2 %   Neutrophils Relative % 85 %   Neutro Abs 9.2 (H) 1.7 - 7.7 K/uL   Lymphocytes Relative 8 %   Lymphs Abs 0.8 0.7 - 4.0 K/uL   Monocytes Relative 7 %   Monocytes Absolute 0.8 0.1 - 1.0 K/uL   Eosinophils Relative 0 %   Eosinophils Absolute 0.0 0.0 - 0.5 K/uL   Basophils Relative 0 %   Basophils Absolute 0.0 0.0 - 0.1 K/uL   Immature Granulocytes 0 %   Abs Immature Granulocytes 0.03 0.00 - 0.07 K/uL  Comprehensive metabolic panel     Status: Abnormal   Collection Time: 11/20/21 11:20 PM  Result Value Ref Range   Sodium 141 135 - 145 mmol/L   Potassium 3.5 3.5 - 5.1 mmol/L   Chloride 107 98 - 111 mmol/L   CO2 24 22 - 32 mmol/L   Glucose, Bld 236 (H) 70 - 99 mg/dL   BUN 14 8 - 23 mg/dL   Creatinine, Ser 0.81 0.44 - 1.00 mg/dL   Calcium 8.7 (L) 8.9 - 10.3 mg/dL   Total Protein 6.5 6.5 - 8.1 g/dL    Albumin 3.4 (L) 3.5 - 5.0 g/dL   AST 25 15 - 41 U/L   ALT 31 0 - 44 U/L   Alkaline Phosphatase 38 38 - 126 U/L   Total Bilirubin 1.0 0.3 - 1.2 mg/dL   GFR, Estimated >60 >60 mL/min   Anion gap 10 5 - 15  Protime-INR  Status: None   Collection Time: 11/20/21 11:20 PM  Result Value Ref Range   Prothrombin Time 13.2 11.4 - 15.2 seconds   INR 1.0 0.8 - 1.2  Lipase, blood     Status: None   Collection Time: 11/20/21 11:20 PM  Result Value Ref Range   Lipase 22 11 - 51 U/L  Lactic acid, plasma     Status: None   Collection Time: 11/21/21 12:48 AM  Result Value Ref Range   Lactic Acid, Venous 1.7 0.5 - 1.9 mmol/L     Imaging Orders         CT ABDOMEN PELVIS W CONTRAST    1. Small bowel obstruction with a transition zone seen within the lower abdomen, along the midline. 2. Cholelithiasis. 3. Hepatic steatosis. 4. Large ventral hernia containing fat and a short section of normal appearing transverse colon. 5. Aortic atherosclerosis.       DG Abdomen 1 View      Assessment and [place] is an 71 y.o. female with a small bowel obstruction.  I wonder if this is related to her previous diverticulitis surgeries and loop ileostomy takedown in 2019.  Her exam is benign and she says she had a bowel movement just prior to presenting to the hospital, so this may be a mild obstruction.  I recommend performing the small bowel obstruction protocol.  Contrast may be diagnostic and therapeutic.  Surgery team will follow results.      ICD-10-CM   1. Small bowel obstruction ([place])  K56.609        Felicie Morn, MD  [facility] Use [email or web address] to contact on call provider  New Patient Billing: [phone] - Moderate MDM

## 2021-11-21 NOTE — Assessment & Plan Note (Signed)
-   Hold home tramadol, Norco, gabapentin, Flexeril until able to take p.o.

## 2021-11-21 NOTE — ED Notes (Signed)
Fresh brief applied to pt. Extensive assistance needed.

## 2021-11-21 NOTE — Assessment & Plan Note (Signed)
BMI 35 in setting of DM, HTN, chronic joint pain

## 2021-11-21 NOTE — Assessment & Plan Note (Signed)
Glucose controlled - Hold home dulaglutide, glipizide - Start sliding scale correction insulin every 4 hours

## 2021-11-21 NOTE — Progress Notes (Signed)
ANTICOAGULATION CONSULT NOTE - Follow Up Consult  Pharmacy Consult for Heparin IV Indication: History of PE (warfarin on hold)  Allergies  Allergen Reactions   Adhesive [Tape] Other (See Comments)    Pt reports, when removing tape from skin most tapes pull her skin off. Ok to use paper tape   Other Hives    Chlorhexadine wipes/CHG wipes,    Latex Rash    Exam gloves/ does not react around elastic and lips don't swell when blowing balloons   Morphine And Related Nausea And Vomiting    Patient Measurements: Height: 5\' 3"  (160 cm) Weight: 90.7 kg (200 lb) IBW/kg (Calculated) : 52.4 Heparin Dosing Weight: 73.1 kg  Vital Signs: BP: 122/83 (07/01 1000) Pulse Rate: 92 (07/01 1000)  Labs: Recent Labs    11/20/21 2122 11/20/21 2320  HGB 14.4 14.0  HCT 44.1 41.6  PLT 222 198  LABPROT  --  13.2  INR  --  1.0  CREATININE 0.76 0.81  TROPONINIHS 5  --     Estimated Creatinine Clearance: 69.1 mL/min (by C-G formula based on SCr of 0.81 mg/dL).   Medications:  Scheduled:   insulin aspart  0-9 Units Subcutaneous Q4H   Infusions:   0.9 % NaCl with KCl 20 mEq / L 100 mL/hr at 11/21/21 0847   heparin 1,200 Units/hr (11/21/21 0408)    Assessment: 71 yo female on chronic warfarin for hx PE.  Presents with SBO, NGT in place.  Pharmacy consulted to dose IV heparin.   Home dose of warfarin reported as 6mg  Mon/Tues/Wed/Thurs; 7mg  Fri/Sat/Sun - last dose taken 6/22.  INR = 1.0  Today, 11/21/2021: Heparin level 0.38, therapeutic on heparin 1200 units/hr CBC: Hgb and Plt WNL No bleeding or complications reported.   Goal of Therapy:  Heparin level 0.3-0.7 units/ml Monitor platelets by anticoagulation protocol: Yes   Plan:  Continue heparin IV infusion at 1200 units/hr Heparin level, confirmatory, in 8 hours. Daily heparin level and CBC   Gretta Arab PharmD, BCPS Clinical Pharmacist [facility] main pharmacy [phone] 11/21/2021 12:01 PM

## 2021-11-21 NOTE — ED Notes (Signed)
This RN consulted with xray, notified of gastrografin being administered at 0826, plan for follow up imaging per protocols.

## 2021-11-21 NOTE — Assessment & Plan Note (Signed)
-   Hold home Atacand until able to take p.o. - As needed hydralazine as needed for severe range pressures

## 2021-11-21 NOTE — Assessment & Plan Note (Signed)
CT showed SBO with transition point in the mid abdomen.  Large ventral hernia, no masses.  NG placed in the ER.  Feeling better. - Maintain NG to LIS - IV fluids - Antiemetics and analgesics as needed - Consult general surgery - Follow small bowel protocol serial imaging

## 2021-11-21 NOTE — Hospital Course (Addendum)
[name] is a 71 y.o. F with hx PE on warfarin, HTN, DM, obesity BMI 35, and hx Hartmann's for recurrent diverticulitis who presented after a fall.  Had an epidural injection at L4 the day prior admission, afterwards, went to [employer], there her leg went numb in an L4 distribution, she fell, hit her head.  In the ER, CTH normal, she was discharged but over the course of the day developed worsening abdominal pain and vomiting.    Returned to ER where further imaging showed SBO and so an NG tube was placed.

## 2021-11-21 NOTE — Progress Notes (Signed)
ANTICOAGULATION CONSULT NOTE - Initial Consult  Pharmacy Consult for Heparin while Warfarin on hold Indication: History of PE  Allergies  Allergen Reactions   Adhesive [Tape] Other (See Comments)    Pt reports, when removing tape from skin most tapes pull her skin off. Ok to use paper tape   Other Hives    Chlorhexadine wipes/CHG wipes,    Latex Rash    Exam gloves/ does not react around elastic and lips don't swell when blowing balloons   Morphine And Related Nausea And Vomiting    Patient Measurements: Height: 5\' 3"  (160 cm) Weight: 90.7 kg (200 lb) IBW/kg (Calculated) : 52.4 Heparin Dosing Weight: 73.1 kg  Vital Signs: Temp: 97 F (36.1 C) (06/30 2224) Temp Source: Axillary (06/30 2224) BP: 160/85 (07/01 0300) Pulse Rate: 86 (07/01 0300)  Labs: Recent Labs    11/20/21 2122 11/20/21 2320  HGB 14.4 14.0  HCT 44.1 41.6  PLT 222 198  LABPROT  --  13.2  INR  --  1.0  CREATININE 0.76 0.81  TROPONINIHS 5  --     Estimated Creatinine Clearance: 69.1 mL/min (by C-G formula based on SCr of 0.81 mg/dL).   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  Presents with SBO, NGT in place.  Pharmacy consulted to dose IV heparin.  Home dose of warfarin reported as 6mg  Mon/Tues/Wed/Thurs; 7mg  Fri/Sat/Sun - last dose taken 6/22.  INR = 1.0  Goal of Therapy:  Heparin  level 0.3-0.7 units/ml Monitor platelets by anticoagulation protocol: Yes   Plan:  Give 2000 units bolus x 1 Start heparin infusion at 1200 units/hr Check anti-Xa level in 8 hours and daily while on heparin Continue to monitor H&H and platelets  Peggyann Juba, PharmD, BCPS Pharmacy: [phone] 11/21/2021,3:31 AM

## 2021-11-21 NOTE — ED Provider Notes (Signed)
[facility] DEPT Provider Note   CSN: [id number] Arrival date & time: 11/20/21  2208     History  Chief Complaint  Patient presents with   Nausea    Verle Brillhart is a 71 y.o. female.  HPI  Patient with medical history including hypertension, PE, currently on warfarin, hiatal hernia, diverticulitis, status post colostomy, SOB presents with complaints of lower abdominal pain.  Patient's pain started today, came on suddenly, pain is in her lower abdomen, does not radiate, remains constant, states she has continuous nausea and vomiting, but unable to tolerate p.o., states she still passing gas and having bowel movements, states the last one was earlier today, she denies any fevers chills chest pain shortness of breath general body aches, she states that she she has been taking her warfarin as prescribed, she denies melena or hematochezia.    Home Medications Prior to Admission medications   Medication Sig Start Date End Date Taking? Authorizing Provider  alendronate (FOSAMAX) 70 MG tablet Take 70 mg by mouth every Friday. Take with a full glass of water on an empty stomach.   Yes [provider]  candesartan (ATACAND) 16 MG tablet Take 16 mg by mouth in the morning. 08/03/17  Yes [provider]  cholecalciferol (VITAMIN D) 25 MCG (1000 UNIT) tablet Take 1,000 Units by mouth in the morning.   Yes [provider]  Cyanocobalamin (VITAMIN B-12) 5000 MCG TBDP Take 5,000 mcg by mouth in the morning.   Yes [provider]  cyclobenzaprine (FLEXERIL) 10 MG tablet Take 10 mg by mouth at bedtime. 07/05/16  Yes [provider]  Dulaglutide 1.5 MG/0.5ML SOPN Inject 1.5 mg into the skin every Saturday.   Yes [provider]  gabapentin (NEURONTIN) 100 MG capsule Take 200 mg by mouth with breakfast, with lunch, and with evening meal.   Yes [provider]  gabapentin (NEURONTIN) 300 MG capsule Take  300 mg by mouth at bedtime.   Yes [provider]  glipiZIDE (GLUCOTROL) 10 MG tablet Take 10 mg by mouth 2 (two) times daily before a meal.   Yes [provider]  HYDROcodone-acetaminophen (NORCO/VICODIN) 5-325 MG tablet Take 1 tablet by mouth every 6 (six) hours as needed for moderate pain.   Yes [provider]  meclizine (ANTIVERT) 25 MG tablet Take 25 mg by mouth 3 (three) times daily as needed for dizziness. 12/29/17  Yes [provider]  rOPINIRole (REQUIP) 0.5 MG tablet Take 0.5 mg by mouth at bedtime.   Yes [provider]  traMADol (ULTRAM) 50 MG tablet Take 50 mg by mouth at bedtime as needed for moderate pain or severe pain.   Yes [provider]  warfarin (COUMADIN) 6 MG tablet Take 6 mg by mouth See admin instructions. Take 1 tablet (6 mg) by mouth on Mondays, Tuesdays, Wednesdays, & Thursdays Take 1 tablet (6 mg) with 1 tablet (1 mg) by mouth on Fridays, Saturdays & Sundays (7 mg)   Yes [provider]  calcium carbonate (CALCIUM 600) 600 MG TABS tablet Take 1 tablet (600 mg total) by mouth 2 (two) times daily with a meal. 02/25/20 11/10/21  Milinda Pointer, MD  Multiple Vitamin (MULTIVITAMIN WITH MINERALS) TABS tablet Take 1 tablet by mouth in the morning.    [provider]      Allergies    Adhesive [tape], Other, Latex, and Morphine and related    Review of Systems   Review of Systems  Constitutional:  Negative for chills and fever.  Respiratory:  Negative for shortness of breath.   Cardiovascular:  Negative for chest pain.  Gastrointestinal:  Positive for abdominal pain, nausea and vomiting. Negative for constipation and diarrhea.  Neurological:  Negative for headaches.    Physical Exam Updated Vital Signs BP (!) 160/85   Pulse 86   Temp (!) 97 F (36.1 C) (Axillary)   Resp 20   Ht 5' 3" (1.6 m)   Wt 90.7 kg   SpO2 94%   BMI 35.43 kg/m  Physical Exam Vitals and nursing note reviewed.   Constitutional:      General: She is not in acute distress.    Appearance: She is not ill-appearing.  HENT:     Head: Normocephalic and atraumatic.     Nose: No congestion.  Eyes:     Conjunctiva/sclera: Conjunctivae normal.  Cardiovascular:     Rate and Rhythm: Normal rate and regular rhythm.     Pulses: Normal pulses.     Heart sounds: No murmur heard.    No friction rub. No gallop.  Pulmonary:     Effort: No respiratory distress.     Breath sounds: No wheezing, rhonchi or rales.  Abdominal:     Palpations: Abdomen is soft.     Tenderness: There is abdominal tenderness. There is no right CVA tenderness, left CVA tenderness or guarding.     Comments: Abdomen nondistended normal bowel sounds, tenderness in the lower abdomen, without guarding rebound tenderness or peritoneal sign negative Murphy sign McBurney point.  Musculoskeletal:     Right lower leg: No edema.     Left lower leg: No edema.  Skin:    General: Skin is warm and dry.  Neurological:     Mental Status: She is alert.  Psychiatric:        Mood and Affect: Mood normal.     ED Results / Procedures / Treatments   Labs (all labs ordered are listed, but only abnormal results are displayed) Labs Reviewed  CBC WITH DIFFERENTIAL/PLATELET - Abnormal; Notable for the following components:      Result Value   WBC 10.8 (*)    Neutro Abs 9.2 (*)    All other components within normal limits  COMPREHENSIVE METABOLIC PANEL - Abnormal; Notable for the following components:   Glucose, Bld 236 (*)    Calcium 8.7 (*)    Albumin 3.4 (*)    All other components within normal limits  PROTIME-INR  LIPASE, BLOOD  LACTIC ACID, PLASMA  URINALYSIS, ROUTINE W REFLEX MICROSCOPIC  LACTIC ACID, PLASMA  HEPARIN LEVEL (UNFRACTIONATED)  CBC    EKG None  Radiology DG Abdomen 1 View  Result Date: 11/21/2021 CLINICAL DATA:  NG tube placement. EXAM: ABDOMEN - 1 VIEW COMPARISON:  CT yesterday. FINDINGS: Tip and side port of the  enteric tube below the diaphragm in the stomach. Gaseous small bowel distention in the left abdomen, corresponding to small-bowel obstruction on CT. Excreted IV contrast in the renal collecting systems and urinary bladder. Gallstones on CT are not well seen. IMPRESSION: Tip and side port of the enteric tube below the diaphragm in the stomach. Electronically Signed   By: Keith Rake M.D.   On: 11/21/2021 01:57   CT ABDOMEN PELVIS W CONTRAST  Result Date: 11/20/2021 CLINICAL DATA:  Abdominal pain and vomiting. EXAM: CT ABDOMEN AND PELVIS WITH CONTRAST TECHNIQUE: Multidetector CT imaging of the abdomen and pelvis was performed using the standard protocol following bolus administration of intravenous  contrast. RADIATION DOSE REDUCTION: This exam was performed according to the departmental dose-optimization program which includes automated exposure control, adjustment of the mA and/or kV according to patient size and/or use of iterative reconstruction technique. CONTRAST:  155m OMNIPAQUE IOHEXOL 300 MG/ML  SOLN COMPARISON:  August 14, 2020 FINDINGS: Lower chest: No acute abnormality. Hepatobiliary: There is diffuse fatty infiltration of the liver parenchyma. No focal liver abnormality is seen. Numerous subcentimeter gallstones are seen within the lumen of a mildly distended gallbladder. There is no evidence of gallbladder wall thickening or biliary dilatation. Pancreas: Unremarkable. No pancreatic ductal dilatation or surrounding inflammatory changes. Spleen: Normal in size without focal abnormality. Adrenals/Urinary Tract: Adrenal glands are unremarkable. Kidneys are normal, without renal calculi, focal lesion, or hydronephrosis. Bladder is unremarkable. Stomach/Bowel: Stomach is within normal limits. Surgically anastomosed bowel is seen within the anterior aspect of the right lower quadrant. The appendix is not identified. Multiple dilated small bowel loops are seen throughout the abdomen on the left (maximum  small bowel diameter of approximately 3.9 cm). A transition zone is seen within the lower abdomen, along the midline (axial CT images 52 through 56, CT series 2). Vascular/Lymphatic: Aortic atherosclerosis. No enlarged abdominal or pelvic lymph nodes. Reproductive: Uterus and bilateral adnexa are unremarkable. Other: An 8.5 cm x 3.1 cm x 9.0 cm ventral hernia is seen on the midline of the upper abdomen, just above the level of the umbilicus. This contains fat and a short section of normal appearing transverse colon. No abdominopelvic ascites. Musculoskeletal: There is grade 1 anterolisthesis of the L4 vertebral body on L5, with marked severity degenerative changes seen at the levels of L4-L5 and L5-S1. IMPRESSION: 1. Small bowel obstruction with a transition zone seen within the lower abdomen, along the midline. 2. Cholelithiasis. 3. Hepatic steatosis. 4. Large ventral hernia containing fat and a short section of normal appearing transverse colon. 5. Aortic atherosclerosis. Aortic Atherosclerosis (ICD10-I70.0). Electronically Signed   By: TVirgina NorfolkM.D.   On: 11/20/2021 23:21   CT Cervical Spine Wo Contrast  Result Date: 11/19/2021 CLINICAL DATA:  Fall EXAM: CT CERVICAL SPINE WITHOUT CONTRAST TECHNIQUE: Multidetector CT imaging of the cervical spine was performed without intravenous contrast. Multiplanar CT image reconstructions were also generated. RADIATION DOSE REDUCTION: This exam was performed according to the departmental dose-optimization program which includes automated exposure control, adjustment of the mA and/or kV according to patient size and/or use of iterative reconstruction technique. COMPARISON:  None Available. FINDINGS: Alignment: Normal Skull base and vertebrae: No acute fracture. No primary bone lesion or focal pathologic process. Soft tissues and spinal canal: No prevertebral fluid or swelling. No visible canal hematoma. Disc levels: Disc space narrowing at C6-7 and C7-T1. Early  anterior spurring in the lower cervical spine. Upper chest: No acute findings Other: None IMPRESSION: Degenerative changes in the lower cervical spine. No acute bony abnormality. Electronically Signed   By: KRolm BaptiseM.D.   On: 11/19/2021 17:33   CT HEAD WO CONTRAST (5MM)  Result Date: 11/19/2021 CLINICAL DATA:  Fall EXAM: CT HEAD WITHOUT CONTRAST TECHNIQUE: Contiguous axial images were obtained from the base of the skull through the vertex without intravenous contrast. RADIATION DOSE REDUCTION: This exam was performed according to the departmental dose-optimization program which includes automated exposure control, adjustment of the mA and/or kV according to patient size and/or use of iterative reconstruction technique. COMPARISON:  None Available. FINDINGS: Brain: No acute intracranial abnormality. Specifically, no hemorrhage, hydrocephalus, mass lesion, acute infarction, or significant intracranial injury. Vascular: No  hyperdense vessel or unexpected calcification. Skull: No acute calvarial abnormality. Sinuses/Orbits: No acute findings Other: None IMPRESSION: Normal study. Electronically Signed   By: Rolm Baptise M.D.   On: 11/19/2021 17:32   DG Hip Unilat W or Wo Pelvis 2-3 Views Right  Result Date: 11/19/2021 CLINICAL DATA:  Hip numbness.  Fall. EXAM: DG HIP (WITH OR WITHOUT PELVIS) 2-3V RIGHT COMPARISON:  09/01/2021 FINDINGS: Mild degenerative changes in the right hip with joint space narrowing and early spurring. Left hip joint space maintained. SI joints symmetric and unremarkable. No acute bony abnormality. Specifically, no fracture, subluxation, or dislocation. Prior sacroplasty changes again noted, stable. IMPRESSION: No acute bony abnormality. Electronically Signed   By: Rolm Baptise M.D.   On: 11/19/2021 17:22   DG PAIN CLINIC C-ARM 1-60 MIN NO REPORT  Result Date: 11/19/2021 Fluoro was used, but no Radiologist interpretation will be provided. Please refer to "NOTES" tab for provider  progress note.   Procedures Procedures    Medications Ordered in ED Medications  heparin ADULT infusion 100 units/mL (25000 units/249m) (1,200 Units/hr Intravenous New Bag/Given 11/21/21 0408)  iohexol (OMNIPAQUE) 300 MG/ML solution 100 mL (100 mLs Intravenous Contrast Given 11/20/21 2305)  ondansetron (ZOFRAN) injection 4 mg (4 mg Intravenous Given 11/21/21 0006)  HYDROmorphone (DILAUDID) injection 1 mg (1 mg Intravenous Given 11/21/21 0041)  heparin bolus via infusion 2,000 Units (2,000 Units Intravenous Bolus from Bag 11/21/21 0409)    ED Course/ Medical Decision Making/ A&P                           Medical Decision Making Amount and/or Complexity of Data Reviewed Labs: ordered. Radiology: ordered.  Risk Prescription drug management. Decision regarding hospitalization.   This patient presents to the ED for concern of stomach pain, this involves an extensive number of treatment options, and is a complaint that carries with it a high risk of complications and morbidity.  The differential diagnosis includes bowel obstruction, volvulus, diverticulitis    Additional history obtained:  Additional history obtained from family who is at bedside External records from outside source obtained and reviewed including previous ED notes, previous discharge summaries   Co morbidities that complicate the patient evaluation  Colectomy, anticoagulated  Social Determinants of Health:  Geriatric    Lab Tests:  I Ordered, and personally interpreted labs.  The pertinent results include: CBC shows leukocytosis of 10.8, CMP shows glucose of 238 calcium 8.7 albumin 3.4 INR/prothrombin time unremarkable lipase 22, lactic 1.7   Imaging Studies ordered:  I ordered imaging studies including CT on pelvis, DG abdomen I independently visualized and interpreted imaging which showed bowel obstruction with transition within the lower abdomen along the midline, large ventral hernia without evidence of  incarceration, DG of abdomen reveals NG tube in proper placement. I agree with the radiologist interpretation   Cardiac Monitoring:  The patient was maintained on a cardiac monitor.  I personally viewed and interpreted the cardiac monitored which showed an underlying rhythm of: N/A   Medicines ordered and prescription drug management:  I ordered medication including antiemetics, pain medication I have reviewed the patients home medicines and have made adjustments as needed  Critical Interventions:  N/A   Reevaluation:  Presents with stomach pain came on suddenly, triage obtain lab work imaging was read personally reviewed, reveals patient has a bowel obstruction, will place NG tube, prior with pain medication nausea medication consult with general surgery as well as hospitalist for admission  Updated patient on these findings, she is agreement this plan and like to be admitted.  Consultations Obtained:  I requested consultation with the Dr. Thermon Leyland general surgery,  and discussed lab and imaging findings as well as pertinent plan - they recommend: Agreement with plan to admit to medicine with NG tube tape placement will be available for consultation Dr. Nevada Crane of the hospitalist team will admit    Test Considered:  N/A    Rule out low suspicion for lower lobe pneumonia as lung sounds are clear bilaterally, will defer imaging at this time.  I have low suspicion for liver or gallbladder abnormality as she has no right upper quadrant tenderness, liver enzymes, alk phos, T bili all within normal limits.  Low suspicion for pancreatitis as lipase is within normal limits.  Low suspicion for ruptured stomach ulcer as she has no peritoneal sign present on exam.  Low suspicion for complicated diverticulitis as she is nontoxic-appearing, vital signs reassuring no leukocytosis present.  Low suspicion for appendicitis as she has no right lower quadrant tenderness, vital signs reassuring.   Low suspicion for AAA or dissection presentation atypical more consistent likely bowel obstruction.   Dispostion and problem list  After consideration of the diagnostic results and the patients response to treatment, I feel that the patent would benefit from admission.  Small bowel obstruction-suspect is likely due to adhesions from previous surgeries, NG tube has been placed, pain is well managed, will need further evaluation.            Final Clinical Impression(s) / ED Diagnoses Final diagnoses:  Small bowel obstruction [facility])    Rx / DC Orders ED Discharge Orders     None         Marcello Fennel, PA-C 11/21/21 0417    Maudie Flakes, MD 11/21/21 [phone]

## 2021-11-22 ENCOUNTER — Inpatient Hospital Stay (HOSPITAL_COMMUNITY): Payer: PPO

## 2021-11-22 DIAGNOSIS — K5669 Other partial intestinal obstruction: Secondary | ICD-10-CM | POA: Diagnosis not present

## 2021-11-22 DIAGNOSIS — K56609 Unspecified intestinal obstruction, unspecified as to partial versus complete obstruction: Secondary | ICD-10-CM | POA: Diagnosis not present

## 2021-11-22 DIAGNOSIS — I160 Hypertensive urgency: Secondary | ICD-10-CM

## 2021-11-22 DIAGNOSIS — I10 Essential (primary) hypertension: Secondary | ICD-10-CM | POA: Diagnosis not present

## 2021-11-22 DIAGNOSIS — Z86718 Personal history of other venous thrombosis and embolism: Secondary | ICD-10-CM | POA: Diagnosis not present

## 2021-11-22 DIAGNOSIS — G894 Chronic pain syndrome: Secondary | ICD-10-CM | POA: Diagnosis not present

## 2021-11-22 LAB — CBC
HCT: 40 % (ref 36.0–46.0)
Hemoglobin: 12.7 g/dL (ref 12.0–15.0)
MCH: 30.1 pg (ref 26.0–34.0)
MCHC: 31.8 g/dL (ref 30.0–36.0)
MCV: 94.8 fL (ref 80.0–100.0)
Platelets: 153 10*3/uL (ref 150–400)
RBC: 4.22 MIL/uL (ref 3.87–5.11)
RDW: 15.2 % (ref 11.5–15.5)
WBC: 7.9 10*3/uL (ref 4.0–10.5)
nRBC: 0 % (ref 0.0–0.2)

## 2021-11-22 LAB — HEMOGLOBIN A1C
Hgb A1c MFr Bld: 7.5 % — ABNORMAL HIGH (ref 4.8–5.6)
Mean Plasma Glucose: 168.55 mg/dL

## 2021-11-22 LAB — BASIC METABOLIC PANEL
Anion gap: 7 (ref 5–15)
BUN: 10 mg/dL (ref 8–23)
CO2: 23 mmol/L (ref 22–32)
Calcium: 7.7 mg/dL — ABNORMAL LOW (ref 8.9–10.3)
Chloride: 112 mmol/L — ABNORMAL HIGH (ref 98–111)
Creatinine, Ser: 0.56 mg/dL (ref 0.44–1.00)
GFR, Estimated: >60 mL/min (ref >60)
Glucose, Bld: 157 mg/dL — ABNORMAL HIGH (ref 70–99)
Potassium: 3.7 mmol/L (ref 3.5–5.1)
Sodium: 142 mmol/L (ref 135–145)

## 2021-11-22 LAB — GLUCOSE, CAPILLARY
Glucose-Capillary: 148 mg/dL — ABNORMAL HIGH (ref 70–99)
Glucose-Capillary: 154 mg/dL — ABNORMAL HIGH (ref 70–99)
Glucose-Capillary: 180 mg/dL — ABNORMAL HIGH (ref 70–99)
Glucose-Capillary: 191 mg/dL — ABNORMAL HIGH (ref 70–99)

## 2021-11-22 LAB — HIV ANTIBODY (ROUTINE TESTING W REFLEX): HIV Screen 4th Generation wRfx: NONREACTIVE

## 2021-11-22 LAB — PROTIME-INR
INR: 1 (ref 0.8–1.2)
Prothrombin Time: 13.4 seconds (ref 11.4–15.2)

## 2021-11-22 LAB — HEPARIN LEVEL (UNFRACTIONATED): Heparin Unfractionated: 0.39 IU/mL (ref 0.30–0.70)

## 2021-11-22 MED ORDER — WARFARIN SODIUM 1 MG PO TABS
7.0000 mg | ORAL_TABLET | Freq: Once | ORAL | Status: AC
  Administered 2021-11-22: 7 mg via ORAL
  Filled 2021-11-22: qty 1

## 2021-11-22 MED ORDER — WARFARIN - PHARMACIST DOSING INPATIENT: Freq: Every day | Status: DC

## 2021-11-22 MED ORDER — ENOXAPARIN SODIUM 150 MG/ML IJ SOSY
1.5000 mg/kg | PREFILLED_SYRINGE | INTRAMUSCULAR | Status: DC
Start: 2021-11-22 — End: 2021-11-22
  Administered 2021-11-22: 135 mg via SUBCUTANEOUS
  Filled 2021-11-22: qty 0.9

## 2021-11-22 NOTE — Care Management Obs Status (Signed)
[place] NOTIFICATION   Patient Details  Name: Kara [name] MRN: [id number] Date of Birth: 1950/12/06   Medicare Observation Status Notification Given:  Yes    Ross Ludwig, LCSW 11/22/2021, 4:03 PM

## 2021-11-22 NOTE — Progress Notes (Signed)
ABI's have been completed. Preliminary results can be found in CV Proc through chart review.   11/22/21 11:16 AM Carlos Levering RVT

## 2021-11-22 NOTE — Progress Notes (Signed)
Patient discharged to home w/ family. Given all belongings, instructions. Verbalized understanding of all instructions. Escorted to pov via w/c. 

## 2021-11-22 NOTE — Progress Notes (Signed)
ANTICOAGULATION CONSULT NOTE - Follow Up Consult  Pharmacy Consult for Heparin IV Indication: History of PE (warfarin on hold)  Allergies  Allergen Reactions   Adhesive [Tape] Other (See Comments)    Pt reports, when removing tape from skin most tapes pull her skin off. Ok to use paper tape   Other Hives    Chlorhexadine wipes/CHG wipes,    Latex Rash    Exam gloves/ does not react around elastic and lips don't swell when blowing balloons   Morphine And Related Nausea And Vomiting    Patient Measurements: Height: 5\' 3"  (160 cm) Weight: 90.7 kg (200 lb) IBW/kg (Calculated) : 52.4 Heparin Dosing Weight: 73.1 kg  Vital Signs: Temp: 98.5 F (36.9 C) (07/02 0904) Temp Source: Oral (07/02 0904) BP: 136/60 (07/02 0904) Pulse Rate: 91 (07/02 0904)  Labs: Recent Labs    11/20/21 2122 11/20/21 2320 11/21/21 1417 11/21/21 2148 11/22/21 0823  HGB 14.4 14.0 14.1  --  12.7  HCT 44.1 41.6 43.7  --  40.0  PLT 222 198 206  --  153  LABPROT  --  13.2  --   --  13.4  INR  --  1.0  --   --  1.0  HEPARINUNFRC  --   --  0.38 0.29* 0.39  CREATININE 0.76 0.81  --   --  0.56  TROPONINIHS 5  --   --   --   --      Estimated Creatinine Clearance: 69.9 mL/min (by C-G formula based on SCr of 0.56 mg/dL). 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  Presents with SBO, NGT in place.  Pharmacy consulted to dose IV heparin while NPO, but now tolerating PO and Pharmacy is consulted to transition to Lovenox bridge and resume warfarin dosing.   Home dose of warfarin reported as 6mg  Mon/Tues/Wed/Thurs; 7mg  Fri/Sat/Sun - last dose taken 6/22.  INR = 1.0  11/22/2021 Confirmatory Heparin level 0.39 - therapeutic on heparin 1250 units/hr INR 1.0 CBC: Hgb and Plt WNL No bleeding or complications reported.  Resumed oral diet - tolerating well per MD.    Goal of Therapy:  Heparin level 0.3-0.7 units/ml Monitor platelets by anticoagulation protocol: Yes   Plan:  D/C Heparin  infusion Start enoxaparin at the time of heparin discontinuation. Enoxaparin 1.5 mg/kg (135mg ) SQ q24h Warfarin 7mg  PO once today.   Daily INR, CBC q72h Follow up discharge plans.     Gretta Arab PharmD, BCPS Clinical Pharmacist [facility] main pharmacy [phone] 11/22/2021 11:34 AM

## 2021-11-22 NOTE — Discharge Summary (Signed)
Physician Discharge Summary   Patient: Kara [name] MRN: [id number] DOB: [birth year]  Admit date:     11/20/2021  Discharge date: 11/22/21  Discharge Physician: Kara [name]   PCP: Kara Harrier, MD     Recommendations at discharge:  Follow up with PCP Kara [name] in 1 week for INR check     Discharge Diagnoses: Principal Problem:   SBO (small bowel obstruction) ([place]) Active Problems:   Type 2 diabetes mellitus with hyperglycemia, without long-term current use of insulin (HCC)   Essential hypertension   Chronic pain syndrome   Morbid obesity ([place])   History of recurrent venous thromboembolism   Subtherapeutic international normalized ratio (INR)      Hospital Course: Kara [name] is a 71 y.o. F with hx PE on warfarin, HTN, DM, obesity BMI 35, and hx Hartmann's for recurrent diverticulitis who presented after a fall.  Had an epidural injection at L4 the day prior admission, afterwards, went to [employer], there her leg went numb in an L4 distribution, she fell, hit her head.  In the ER, CTH normal, she was discharged but over the course of the day developed worsening abdominal pain and vomiting.    Returned to ER where further imaging showed SBO and so an NG tube was placed.      * SBO (small bowel obstruction) (HCC) CT showed SBO with transition point in the mid abdomen.  Large ventral hernia, no masses.  NG placed in the ER.  Gastrograffin protocol administered and patient had faster than expected resolution of her small bowel obstruction.    NG removed this mroning, and patient able to tolerate solid food for breakfast and lunch without difficulty.  Large BM this mroning.  Safe for discharge.    History of recurrent venous thromboembolism Subtherapeutic international normalized ratio (INR) PE in 2019, had an IVC filter for some time, now out.    Recently held warfarin for ESI.  Discussed bridging, in this context, it is not warranted.   Resume warfarin and follow up INR this week with PCP.  Morbid obesity ([place]) BMI 35 in setting of DM, HTN, chronic joint pain          [facility] was reviewed for this patient prior to discharge.       Disposition: Home   DISCHARGE MEDICATION: Allergies as of 11/22/2021       Reactions   Adhesive [tape] Other (See Comments)   Pt reports, when removing tape from skin most tapes pull her skin off. Ok to use paper tape   Other Hives   Chlorhexadine wipes/CHG wipes,    Latex Rash   Exam gloves/ does not react around elastic and lips don't swell when blowing balloons   Morphine And Related Nausea And Vomiting        Medication List     TAKE these medications    alendronate 70 MG tablet Commonly known as: FOSAMAX Take 70 mg by mouth every Friday. Take with a full glass of water on an empty stomach.   calcium carbonate 600 MG Tabs tablet Commonly known as: Calcium 600 Take 1 tablet (600 mg total) by mouth 2 (two) times daily with a meal.   candesartan 16 MG tablet Commonly known as: ATACAND Take 16 mg by mouth in the morning.   cholecalciferol 25 MCG (1000 UNIT) tablet Commonly known as: VITAMIN D Take 1,000 Units by mouth in the morning.   cyclobenzaprine 10 MG tablet Commonly known  as: FLEXERIL Take 10 mg by mouth at bedtime.   Dulaglutide 1.5 MG/0.5ML Sopn Inject 1.5 mg into the skin every Saturday.   gabapentin 100 MG capsule Commonly known as: NEURONTIN Take 200 mg by mouth with breakfast, with lunch, and with evening meal.   gabapentin 300 MG capsule Commonly known as: NEURONTIN Take 300 mg by mouth at bedtime.   glipiZIDE 10 MG tablet Commonly known as: GLUCOTROL Take 10 mg by mouth 2 (two) times daily before a meal.   HYDROcodone-acetaminophen 5-325 MG tablet Commonly known as: NORCO/VICODIN Take 1 tablet by mouth every 6 (six) hours as needed for moderate pain.   meclizine 25 MG tablet Commonly known  as: ANTIVERT Take 25 mg by mouth 3 (three) times daily as needed for dizziness.   multivitamin with minerals Tabs tablet Take 1 tablet by mouth in the morning.   rOPINIRole 0.5 MG tablet Commonly known as: REQUIP Take 0.5 mg by mouth at bedtime.   traMADol 50 MG tablet Commonly known as: ULTRAM Take 50 mg by mouth at bedtime as needed for moderate pain or severe pain.   Vitamin B-12 5000 MCG Tbdp Take 5,000 mcg by mouth in the morning.   warfarin 6 MG tablet Commonly known as: COUMADIN Take 6 mg by mouth See admin instructions. Take 1 tablet (6 mg) by mouth on Mondays, Tuesdays, Wednesdays, & Thursdays Take 1 tablet (6 mg) with 1 tablet (1 mg) by mouth on Fridays, Saturdays & Sundays (7 mg)        Follow-up Information     [name], Vishwanath, MD. Schedule an appointment as soon as possible for a visit in 1 week(s).   Specialty: Internal Medicine Contact information: [address] [place] [address] [phone]                 Discharge Instructions     Discharge instructions   Complete by: As directed    From Dr. Loleta Books: You were admitted for a small bowel obstruction. Thankfully, this was very mild and resolved quickly You should resume all your home medicines  Because of your recent procedure (and holding your warfarin for it), your INR is very low. Resume your normal warfarin dose and go see Kara [name] this week for an INR recheck   Increase activity slowly   Complete by: As directed        Discharge Exam: Filed Weights   11/20/21 2224  Weight: 90.7 kg    General: Pt is alert, awake, not in acute distress Cardiovascular: RRR, nl S1-S2, no murmurs appreciated.   No LE edema.   Respiratory: Normal respiratory rate and rhythm.  CTAB without rales or wheezes. Abdominal: Abdomen soft and non-tender.  No distension or HSM.   Neuro/Psych: Strength symmetric in upper and lower extremities.  Judgment and insight appear  normal.   Condition at discharge: good  The results of significant diagnostics from this hospitalization (including imaging, microbiology, ancillary and laboratory) are listed below for reference.   Imaging Studies: Inpatient: VAS [place] PAD ABI  Result Date: 11/22/2021  LOWER EXTREMITY DOPPLER STUDY Patient Name:  Kara [name]  Date of Exam:   11/22/2021 Medical Rec #: [id number]              Accession #:    [id number] Date of Birth: 03/24/1951              Patient Gender: F Patient Age:   71 years Exam Location:  [facility] Procedure:      VAS [place] ABI WITH/WO TBI Referring Phys: Sarthak Rubenstein --------------------------------------------------------------------------------  Indications: Hypertension. High Risk Factors: Hypertension, Diabetes.  Limitations: Today's exam was limited due to patient intolerant to cuff              pressure, bandages and Left IV. Comparison Study: No prior studies. Performing Technologist: Carlos Levering RVT  Examination Guidelines: A complete evaluation includes at minimum, Doppler waveform signals and systolic blood pressure reading at the level of bilateral brachial, anterior tibial, and posterior tibial arteries, when vessel segments are accessible. Bilateral testing is considered an integral part of a complete examination. Photoelectric Plethysmograph (PPG) waveforms and toe systolic pressure readings are included as required and additional duplex testing as needed. Limited examinations for reoccurring indications may be performed as noted.  ABI Findings: +---------+------------------+-----+---------+--------+ Right    Rt Pressure (mmHg)IndexWaveform Comment  +---------+------------------+-----+---------+--------+ Brachial 138                    triphasic         +---------+------------------+-----+---------+--------+ PTA      162               1.17 triphasic         +---------+------------------+-----+---------+--------+ DP       163                1.18 triphasic         +---------+------------------+-----+---------+--------+ Great Toe110               0.80                   +---------+------------------+-----+---------+--------+ +---------+------------------+-----+---------+-------+ Left     Lt Pressure (mmHg)IndexWaveform Comment +---------+------------------+-----+---------+-------+ Brachial                                 IV      +---------+------------------+-----+---------+-------+ PTA      157               1.14 triphasic        +---------+------------------+-----+---------+-------+ DP       154               1.12 triphasic        +---------+------------------+-----+---------+-------+ Great Toe114               0.83                  +---------+------------------+-----+---------+-------+ +-------+-----------+-----------+------------+------------+ ABI/TBIToday's ABIToday's TBIPrevious ABIPrevious TBI +-------+-----------+-----------+------------+------------+ Right  1.18       0.8                                 +-------+-----------+-----------+------------+------------+ Left   1.14       0.83                                +-------+-----------+-----------+------------+------------+  Summary: Right: Resting right ankle-brachial index is within normal range. No evidence of significant right lower extremity arterial disease. The right toe-brachial index is normal. Left: Resting left ankle-brachial index is within normal range. No evidence of significant left lower extremity arterial disease. The left toe-brachial index is normal. *See table(s) above for measurements and observations.  Electronically signed by Jamelle Haring on 11/22/2021 at 12:23:54 PM.    Final  DG Abd Portable 1V-Small Bowel Obstruction Protocol-initial, 8 hr delay  Result Date: 11/21/2021 CLINICAL DATA:  8 hour delayed image for small-bowel obstruction EXAM: PORTABLE ABDOMEN - 1 VIEW COMPARISON:  CT abdomen pelvis  11/20/2021 FINDINGS: Nasogastric tube terminates in the region of the stomach. No small bowel dilatation. The contrast is located within the colon. Contrast within the bladder likely excreted from recent CT intravenous contrast. IMPRESSION: No evidence of small-bowel obstruction. Contrast located within the colon. Electronically Signed   By: Miachel Roux M.D.   On: 11/21/2021 16:59   DG Abdomen 1 View  Result Date: 11/21/2021 CLINICAL DATA:  NG tube placement. EXAM: ABDOMEN - 1 VIEW COMPARISON:  CT yesterday. FINDINGS: Tip and side port of the enteric tube below the diaphragm in the stomach. Gaseous small bowel distention in the left abdomen, corresponding to small-bowel obstruction on CT. Excreted IV contrast in the renal collecting systems and urinary bladder. Gallstones on CT are not well seen. IMPRESSION: Tip and side port of the enteric tube below the diaphragm in the stomach. Electronically Signed   By: Keith Rake M.D.   On: 11/21/2021 01:57   CT ABDOMEN PELVIS W CONTRAST  Result Date: 11/20/2021 CLINICAL DATA:  Abdominal pain and vomiting. EXAM: CT ABDOMEN AND PELVIS WITH CONTRAST TECHNIQUE: Multidetector CT imaging of the abdomen and pelvis was performed using the standard protocol following bolus administration of intravenous contrast. RADIATION DOSE REDUCTION: This exam was performed according to the departmental dose-optimization program which includes automated exposure control, adjustment of the mA and/or kV according to patient size and/or use of iterative reconstruction technique. CONTRAST:  126mL OMNIPAQUE IOHEXOL 300 MG/ML  SOLN COMPARISON:  August 14, 2020 FINDINGS: Lower chest: No acute abnormality. Hepatobiliary: There is diffuse fatty infiltration of the liver parenchyma. No focal liver abnormality is seen. Numerous subcentimeter gallstones are seen within the lumen of a mildly distended gallbladder. There is no evidence of gallbladder wall thickening or biliary dilatation. Pancreas:  Unremarkable. No pancreatic ductal dilatation or surrounding inflammatory changes. Spleen: Normal in size without focal abnormality. Adrenals/Urinary Tract: Adrenal glands are unremarkable. Kidneys are normal, without renal calculi, focal lesion, or hydronephrosis. Bladder is unremarkable. Stomach/Bowel: Stomach is within normal limits. Surgically anastomosed bowel is seen within the anterior aspect of the right lower quadrant. The appendix is not identified. Multiple dilated small bowel loops are seen throughout the abdomen on the left (maximum small bowel diameter of approximately 3.9 cm). A transition zone is seen within the lower abdomen, along the midline (axial CT images 52 through 56, CT series 2). Vascular/Lymphatic: Aortic atherosclerosis. No enlarged abdominal or pelvic lymph nodes. Reproductive: Uterus and bilateral adnexa are unremarkable. Other: An 8.5 cm x 3.1 cm x 9.0 cm ventral hernia is seen on the midline of the upper abdomen, just above the level of the umbilicus. This contains fat and a short section of normal appearing transverse colon. No abdominopelvic ascites. Musculoskeletal: There is grade 1 anterolisthesis of the L4 vertebral body on L5, with marked severity degenerative changes seen at the levels of L4-L5 and L5-S1. IMPRESSION: 1. Small bowel obstruction with a transition zone seen within the lower abdomen, along the midline. 2. Cholelithiasis. 3. Hepatic steatosis. 4. Large ventral hernia containing fat and a short section of normal appearing transverse colon. 5. Aortic atherosclerosis. Aortic Atherosclerosis (ICD10-I70.0). Electronically Signed   By: Virgina Norfolk M.D.   On: 11/20/2021 23:21   CT Cervical Spine Wo Contrast  Result Date: 11/19/2021 CLINICAL DATA:  Fall EXAM: CT CERVICAL  SPINE WITHOUT CONTRAST TECHNIQUE: Multidetector CT imaging of the cervical spine was performed without intravenous contrast. Multiplanar CT image reconstructions were also generated. RADIATION  DOSE REDUCTION: This exam was performed according to the departmental dose-optimization program which includes automated exposure control, adjustment of the mA and/or kV according to patient size and/or use of iterative reconstruction technique. COMPARISON:  None Available. FINDINGS: Alignment: Normal Skull base and vertebrae: No acute fracture. No primary bone lesion or focal pathologic process. Soft tissues and spinal canal: No prevertebral fluid or swelling. No visible canal hematoma. Disc levels: Disc space narrowing at C6-7 and C7-T1. Early anterior spurring in the lower cervical spine. Upper chest: No acute findings Other: None IMPRESSION: Degenerative changes in the lower cervical spine. No acute bony abnormality. Electronically Signed   By: Rolm Baptise M.D.   On: 11/19/2021 17:33   CT HEAD WO CONTRAST (5MM)  Result Date: 11/19/2021 CLINICAL DATA:  Fall EXAM: CT HEAD WITHOUT CONTRAST TECHNIQUE: Contiguous axial images were obtained from the base of the skull through the vertex without intravenous contrast. RADIATION DOSE REDUCTION: This exam was performed according to the departmental dose-optimization program which includes automated exposure control, adjustment of the mA and/or kV according to patient size and/or use of iterative reconstruction technique. COMPARISON:  None Available. FINDINGS: Brain: No acute intracranial abnormality. Specifically, no hemorrhage, hydrocephalus, mass lesion, acute infarction, or significant intracranial injury. Vascular: No hyperdense vessel or unexpected calcification. Skull: No acute calvarial abnormality. Sinuses/Orbits: No acute findings Other: None IMPRESSION: Normal study. Electronically Signed   By: Rolm Baptise M.D.   On: 11/19/2021 17:32   DG Hip Unilat W or Wo Pelvis 2-3 Views Right  Result Date: 11/19/2021 CLINICAL DATA:  Hip numbness.  Fall. EXAM: DG HIP (WITH OR WITHOUT PELVIS) 2-3V RIGHT COMPARISON:  09/01/2021 FINDINGS: Mild degenerative changes in  the right hip with joint space narrowing and early spurring. Left hip joint space maintained. SI joints symmetric and unremarkable. No acute bony abnormality. Specifically, no fracture, subluxation, or dislocation. Prior sacroplasty changes again noted, stable. IMPRESSION: No acute bony abnormality. Electronically Signed   By: Rolm Baptise M.D.   On: 11/19/2021 17:22   DG PAIN CLINIC C-ARM 1-60 MIN NO REPORT  Result Date: 11/19/2021 Fluoro was used, but no Radiologist interpretation will be provided. Please refer to "NOTES" tab for provider progress note.  DG Lumbar Spine Complete W/Bend  Result Date: 11/05/2021 CLINICAL DATA:  Low back pain. EXAM: LUMBAR SPINE - COMPLETE WITH BENDING VIEWS COMPARISON:  Lumbar spine radiograph dated 04/30/2021. FINDINGS: Five lumbar type vertebra. There is no acute fracture or subluxation of the lumbar spine. There is grade 1 L4-L5 and L5-S1 anterolisthesis. Multilevel degenerative changes and pleural the heart the visualized posterior elements are intact. The soft tissues are unremarkable. IMPRESSION: No acute fracture or subluxation of the lumbar spine. Multilevel degenerative changes. Electronically Signed   By: Anner Crete M.D.   On: 11/05/2021 23:49    Microbiology: Results for orders placed or performed during the hospital encounter of 02/19/21  SARS CORONAVIRUS 2 (TAT 6-24 HRS) Nasopharyngeal Nasopharyngeal Swab     Status: None   Collection Time: 02/19/21  9:09 AM   Specimen: Nasopharyngeal Swab  Result Value Ref Range Status   SARS Coronavirus 2 NEGATIVE NEGATIVE Final    Comment: (NOTE) SARS-CoV-2 target nucleic acids are NOT DETECTED.  The SARS-CoV-2 RNA is generally detectable in upper and lower respiratory specimens during the acute phase of infection. Negative results do not preclude SARS-CoV-2 infection, do  not rule out co-infections with other pathogens, and should not be used as the sole basis for treatment or other patient management  decisions. Negative results must be combined with clinical observations, patient history, and epidemiological information. The expected result is Negative.  Fact Sheet for Patients: SugarRoll.be  Fact Sheet for Healthcare Providers: https://www.woods-mathews.com/  This test is not yet approved or cleared by the [place] FDA and  has been authorized for detection and/or diagnosis of SARS-CoV-2 by FDA under an Emergency Use Authorization (EUA). This EUA will remain  in effect (meaning this test can be used) for the duration of the COVID-19 declaration under Se ction 564(b)(1) of the Act, 21 U.S.C. section 360bbb-3(b)(1), unless the authorization is terminated or revoked sooner.  Performed at [facility], [place] [address] [place], [place] [address]     Labs: CBC: Recent Labs  Lab 11/20/21 2122 11/20/21 2320 11/21/21 1417 11/22/21 0823  WBC 11.4* 10.8* 9.0 7.9  NEUTROABS  --  9.2*  --   --   HGB 14.4 14.0 14.1 12.7  HCT 44.1 41.6 43.7 40.0  MCV 90.4 91.4 93.4 94.8  PLT 222 198 206 888   Basic Metabolic Panel: Recent Labs  Lab 11/20/21 2122 11/20/21 2320 11/22/21 0823  NA 139 141 142  K 3.3* 3.5 3.7  CL 106 107 112*  CO2 23 24 23   GLUCOSE 193* 236* 157*  BUN 14 14 10   CREATININE 0.76 0.81 0.56  CALCIUM 8.7* 8.7* 7.7*   Liver Function Tests: Recent Labs  Lab 11/20/21 2122 11/20/21 2320  AST 25 25  ALT 32 31  ALKPHOS 41 38  BILITOT 1.0 1.0  PROT 7.0 6.5  ALBUMIN 3.9 3.4*   CBG: Recent Labs  Lab 11/21/21 2113 11/21/21 2344 11/22/21 0415 11/22/21 0730 11/22/21 1143  GLUCAP 119* 139* 148* 154* 180*    Discharge time spent: approximately 35 minutes spent on discharge counseling, evaluation of patient on day of discharge, and coordination of discharge planning with nursing, social work, pharmacy and case management  Signed: Edwin Dada, MD [facility] 11/22/2021

## 2021-11-22 NOTE — Plan of Care (Signed)

## 2021-11-22 NOTE — Progress Notes (Signed)
Progress Note: General Surgery Service   Chief Complaint/Subjective: Gastrograffin in colon.  Lots of stool.  Wants NG out  Objective: Vital signs in last 24 hours: Temp:  [98.1 F (36.7 C)-98.7 F (37.1 C)] 98.2 F (36.8 C) (07/02 0413) Pulse Rate:  [89-97] 94 (07/02 0413) Resp:  [16-26] 18 (07/02 0413) BP: (109-146)/(64-84) 146/70 (07/02 0413) SpO2:  [91 %-96 %] 96 % (07/02 0413) Last BM Date : 11/21/21  Intake/Output from previous day: 07/01 0701 - 07/02 0700 In: 1550.9 [P.O.:240; I.V.:1310.9] Out: 150 [Urine:50; Emesis/NG output:100] Intake/Output this shift: No intake/output data recorded.  Constitutional: NAD; conversant; no deformities Eyes: Moist conjunctiva; no lid lag; anicteric; PERRL Neck: Trachea midline; no thyromegaly Lungs: Normal respiratory effort; no tactile fremitus CV: RRR; no palpable thrills; no pitting edema GI: Abd Soft, nontender; no palpable hepatosplenomegaly MSK: Normal range of motion of extremities; no clubbing/cyanosis Psychiatric: Appropriate affect; alert and oriented x3 Lymphatic: No palpable cervical or axillary lymphadenopathy  Lab Results: CBC  Recent Labs    11/20/21 2320 11/21/21 1417  WBC 10.8* 9.0  HGB 14.0 14.1  HCT 41.6 43.7  PLT 198 206   BMET Recent Labs    11/20/21 2122 11/20/21 2320  NA 139 141  K 3.3* 3.5  CL 106 107  CO2 23 24  GLUCOSE 193* 236*  BUN 14 14  CREATININE 0.76 0.81  CALCIUM 8.7* 8.7*   PT/INR Recent Labs    11/20/21 2320  LABPROT 13.2  INR 1.0   ABG No results for input(s): "PHART", "HCO3" in the last 72 hours.  Invalid input(s): "PCO2", "PO2"  Anti-infectives: Anti-infectives (From admission, onward)    None       Medications: Scheduled Meds:  insulin aspart  0-9 Units Subcutaneous Q4H   Continuous Infusions:  0.9 % NaCl with KCl 20 mEq / L 100 mL/hr at 11/22/21 0458   heparin 1,250 Units/hr (11/21/21 2321)   PRN Meds:.acetaminophen **OR** acetaminophen, hydrALAZINE,  HYDROmorphone (DILAUDID) injection, ondansetron **OR** ondansetron (ZOFRAN) IV  Assessment/Plan: Kara [name] is an 71 y.o. female with a small bowel obstruction.  I wonder if this is related to her previous diverticulitis surgeries and loop ileostomy takedown in 2019.  Remove NG, FLD and advance as tolerated From surgery perspective, if she tolerates food today, could consider discharge at the end of the day or maybe in the morning   LOS: 1 day     Felicie Morn, MD  [facility] Use [email or web address] to contact on call provider  Daily Billing: [phone] - Straightforward / Low MDM

## 2021-11-22 NOTE — Care Management CC44 (Signed)
Condition Code 44 Documentation Completed  Patient Details  Name: Kara [name] MRN: [id number] Date of Birth: 1951/04/17   Condition Code 44 given:  Yes Patient signature on Condition Code 44 notice:  Yes Documentation of 2 MD's agreement:  Yes Code 44 added to claim:  Yes    Ross Ludwig, LCSW 11/22/2021, 4:03 PM

## 2021-11-25 DIAGNOSIS — R200 Anesthesia of skin: Secondary | ICD-10-CM | POA: Diagnosis not present

## 2021-11-25 DIAGNOSIS — K439 Ventral hernia without obstruction or gangrene: Secondary | ICD-10-CM | POA: Diagnosis not present

## 2021-11-25 DIAGNOSIS — I700 Atherosclerosis of aorta: Secondary | ICD-10-CM | POA: Diagnosis not present

## 2021-11-25 DIAGNOSIS — I10 Essential (primary) hypertension: Secondary | ICD-10-CM | POA: Diagnosis not present

## 2021-11-25 DIAGNOSIS — K56609 Unspecified intestinal obstruction, unspecified as to partial versus complete obstruction: Secondary | ICD-10-CM | POA: Diagnosis not present

## 2021-11-25 DIAGNOSIS — Z09 Encounter for follow-up examination after completed treatment for conditions other than malignant neoplasm: Secondary | ICD-10-CM | POA: Diagnosis not present

## 2021-11-25 DIAGNOSIS — Z7901 Long term (current) use of anticoagulants: Secondary | ICD-10-CM | POA: Diagnosis not present

## 2021-11-25 DIAGNOSIS — R791 Abnormal coagulation profile: Secondary | ICD-10-CM | POA: Diagnosis not present

## 2021-11-30 ENCOUNTER — Ambulatory Visit: Admission: RE | Admit: 2021-11-30 | Payer: PPO | Source: Ambulatory Visit

## 2021-12-02 ENCOUNTER — Ambulatory Visit: Payer: PPO | Attending: Radiation Oncology | Admitting: Radiation Oncology

## 2021-12-02 ENCOUNTER — Inpatient Hospital Stay: Payer: PPO | Admitting: Oncology

## 2021-12-06 NOTE — Progress Notes (Unsigned)
PROVIDER NOTE: Information contained herein reflects review and annotations entered in association with encounter. Interpretation of such information and data should be left to medically-trained personnel. Information provided to patient can be located elsewhere in the medical record under "Patient Instructions". Document created using STT-dictation technology, any transcriptional errors that may result from process are unintentional.    Patient: Kara [name]  Service Category: E/M  Provider: Gaspar Cola, MD  DOB: 1950/07/21  DOS: 12/08/2021  Specialty: Interventional Pain Management  MRN: [id number]  Setting: Ambulatory outpatient  PCP: Tracie Harrier, MD  Type: Established Patient    Referring Provider: Tracie Harrier, MD  Location: Office  Delivery: Face-to-face     HPI  Ms. [name], a 71 y.o. year old female, is here today because of her No primary diagnosis found.. Ms. Daponte primary complain today is No chief complaint on file. Last encounter: My last encounter with her was on 11/20/2021. Pertinent problems: Ms. 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); Lumbar pars defect (L3 and L4) (Bilateral); Diabetic peripheral neuropathy ([place]); Chronic lower extremity pain (2ry area of Pain) (Bilateral); Chronic radicular pain of lower extremity; Osteoarthritis of hip (Right); Chronic low back pain (1ry area of Pain) (Bilateral) w/o sciatica; Spondylosis  without myelopathy or radiculopathy, lumbosacral region; Other specified dorsopathies, sacral and sacrococcygeal region; Secondary osteoarthritis of multiple sites; Sacral insufficiency fracture; DDD (degenerative disc disease), lumbosacral; Coccygodynia; Chronic sacroiliac joint pain (Left); Osteoarthritis of lumbar spine; Spondylolisthesis, lumbosacral region; Abnormal MRI, lumbar spine (08/09/2019); Spasm of muscle of lower back; Chronic hip pain (Bilateral) (R>L); Osteoarthritis of hips (Bilateral); Greater trochanteric bursitis (Right); and Chronic hip pain (Right) on their pertinent problem list. Pain Assessment: Severity of   is reported as a  /10. Location:    / . Onset:  . Quality:  . Timing:  . Modifying factor(s):  Marland Kitchen Vitals:  vitals were not taken for this visit.   Reason for encounter: post-procedure evaluation and assessment. ***  Post-procedure evaluation   Type: Trans-Foraminal Epidural Steroid Injection (Lumbar) #1  Laterality: Right (-RT)  Level: L4 nerve root(s) Imaging: Fluoroscopy-guided Anesthesia: Local anesthesia (1-2% Lidocaine) Anxiolysis: None                 Sedation: None. DOS: 11/19/2021  Performed by: Gaspar Cola, MD  Purpose: Diagnostic/Therapeutic Indications: Lumbar radicular pain severe enough to impact quality of life or function. 1. DDD (degenerative disc disease), lumbosacral   2. Grade 1-2 Anterolisthesis of L4/L5 & L5/S1   3. 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 Chronic lower extremity pain (2ry area of Pain) (Bilateral)   7. Abnormal MRI, lumbar spine (08/09/2019)   8. Chronic anticoagulation (COUMADIN)   9. Latex precautions, history of latex allergy   10. Severe obesity (BMI 35.0-39.9) with comorbidity ([place])    NAS-11 Pain score:   Pre-procedure: 10-Worst pain ever/10   Post-procedure: 8 /10       Effectiveness:  Initial hour after procedure:   ***.  Subsequent 4-6 hours post-procedure:   ***. Analgesia past initial 6 hours:   ***. Ongoing improvement:  Analgesic:  *** Function:    ***    ROM:    ***     Pharmacotherapy Assessment  Analgesic: Oxycodone IR 10 mg, 1 tab PO qd,  from PCP. No chronic opioid analgesics therapy prescribed by our practice, 2ry to Medication Agreement Violation. (Getting oxycodone from PCP in multiple occasions, according to PMP)(11/21/20; 12/26/20; 01/16/21; 02/13/21). Opioid analgesic pharmacotherapy D/C'ed on 03/03/2021. MME: 20 mg/day.   Monitoring: [place] PMP: PDMP reviewed during this encounter.       Pharmacotherapy: No side-effects or adverse reactions reported. Compliance: No problems identified. Effectiveness: Clinically acceptable.  No notes on file  UDS:  Summary  Date Value Ref Range Status  11/12/2020 Note  Final    Comment:    ==================================================================== [employer] Select 13 (MW) ==================================================================== Test                             Result       Flag       Units  Drug Present and Declared for Prescription Verification   Norhydrocodone                 61           EXPECTED   ng/mg creat    Norhydrocodone is an expected metabolite of hydrocodone.    Tramadol                       >4202        EXPECTED   ng/mg creat   O-Desmethyltramadol            >4202        EXPECTED   ng/mg creat   N-Desmethyltramadol            1712         EXPECTED   ng/mg creat    Source of tramadol is a prescription medication. O-desmethyltramadol    and N-desmethyltramadol are expected metabolites of tramadol.  Drug Absent but Declared for Prescription Verification   Hydrocodone                    Not Detected UNEXPECTED ng/mg creat    Hydrocodone is almost always present in patients taking this drug    consistently. Absence of hydrocodone could be due to lapse of time     since the last dose or unusual pharmacokinetics (rapid metabolism).  ==================================================================== Test                      Result    Flag   Units      Ref Range   Creatinine              119              mg/dL      >=20 ==================================================================== Declared Medications:  The flagging and interpretation on this report are based on the  following declared medications.  Unexpected results may arise from  inaccuracies in the declared medications.   **Note: The testing scope of this panel includes these medications:   Hydrocodone  Tramadol   **Note: The testing scope of this panel does not include the  following reported medications:   Acetaminophen  Alendronate (Fosamax)  Aspirin  Calcium  Candesartan  Cholecalciferol  Cyanocobalamin  Cyclobenzaprine  Dulaglutide  Gabapentin  Glipizide  Meclizine  Multivitamin  Nystatin  Ondansetron  Ropinirole  Tolterodine  Warfarin ==================================================================== For clinical consultation, please call [phone]. ====================================================================      ROS  Constitutional: Denies any fever or chills Gastrointestinal: No reported hemesis, hematochezia, vomiting, or acute GI distress Musculoskeletal: Denies any acute onset joint swelling, redness, loss of ROM, or weakness Neurological: No reported episodes of acute onset apraxia, aphasia, dysarthria, agnosia, amnesia, paralysis, loss of coordination, or loss of consciousness  Medication Review  Dulaglutide, HYDROcodone-acetaminophen, Vitamin B-12, alendronate, calcium carbonate, candesartan, cholecalciferol, cyclobenzaprine, gabapentin, glipiZIDE, meclizine, multivitamin with minerals, rOPINIRole, traMADol, and warfarin  History Review  Allergy: Ms. [place] is allergic to adhesive [tape], other, latex, and morphine and  related. Drug: Ms. Madl  reports no history of drug use. Alcohol:  reports no history of alcohol use. Tobacco:  reports that she has never smoked. She has never used smokeless tobacco. Social: Ms. Azad  reports that she has never smoked. She has never used smokeless tobacco. She reports that she does not drink alcohol and does not use drugs. 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 Surgical: Ms. Zufall  has a past surgical history that includes Knee surgery; Carpal tunnel release (Bilateral); Colon resection sigmoid (N/A, 11/02/2016); Colostomy (N/A, 11/02/2016); Sigmoidoscopy (N/A, 11/13/2016); PULMONARY VENOGRAPHY (N/A, 11/19/2016); Colonoscopy with propofol (N/A, 02/03/2017); Colon surgery (11/02/2016); IVC FILTER INSERTION (Right, 10/2016); Colostomy reversal (N/A, 02/24/2017); Appendectomy (N/A, 02/24/2017); Lysis of adhesion (N/A, 02/24/2017); laparoscopy (N/A, 02/24/2017); I[place]o loop diversion (N/A, 02/24/2017); Sigmoidoscopy (N/A, 05/19/2017); Ileostomy closure (N/A, 06/01/2017); IVC FILTER REMOVAL (N/A, 08/09/2017); Sacroplasty (N/A, 11/08/2017); Cataract extraction w/PHACO (Left, 11/21/2018); Cataract extraction w/PHACO (Right, 12/12/2018); and Video bronchoscopy with endobronchial navigation (N/A, 02/23/2021). Family: family history includes Breast cancer in her mother; Cancer in her mother; Diabetes in her mother and sister; Heart disease in her father, mother, and sister.  Laboratory Chemistry Profile   Renal Lab Results  Component Value Date   BUN 10 11/22/2021   CREATININE 0.56 11/22/2021   GFRAA >60 07/09/2019   GFRNONAA >60 11/22/2021    Hepatic Lab Results  Component Value Date   AST 25 11/20/2021   ALT 31  11/20/2021   ALBUMIN 3.4 (L) 11/20/2021   ALKPHOS 38 11/20/2021   LIPASE 22 11/20/2021    Electrolytes Lab Results  Component Value Date   NA 142 11/22/2021   K 3.7 11/22/2021   CL 112 (H) 11/22/2021   CALCIUM 7.7 (L) 11/22/2021   MG 1.7 08/14/2020   PHOS 3.3 11/07/2016    Bone Lab Results  Component Value Date   25OHVITD1 25 (L) 11/02/2017   25OHVITD2 <1.0 11/02/2017   25OHVITD3 25 11/02/2017    Inflammation (CRP: Acute Phase) (ESR: Chronic Phase) Lab Results  Component Value Date   CRP 1.7 (H) 08/15/2020   ESRSEDRATE 12 11/02/2017   LATICACIDVEN 1.7 11/21/2021         Note: Above Lab results reviewed.  Recent Imaging Review  Inpatient: VAS [place] PAD ABI  LOWER EXTREMITY DOPPLER STUDY  Patient Name:  Kiarah Eckstein  Date of Exam:   11/22/2021 Medical Rec #: [id number]              Accession #:    [id number] Date of Birth: 1951-02-09  Patient Gender: F Patient Age:   45 years Exam Location[facility]  [facility] Procedure:      VAS [place] ABI WITH/WO TBI Referring Phys: CHRISTOPHER DANFORD  --------------------------------------------------------------------------------   Indications: Hypertension.  High Risk Factors: Hypertension, Diabetes.   Limitations: Today's exam was limited due to patient intolerant to cuff              pressure, bandages and Left IV.  Comparison Study: No prior studies.  Performing Technologist: Carlos Levering RVT    Examination Guidelines: A complete evaluation includes at minimum, Doppler waveform signals and systolic blood pressure reading at the level of bilateral brachial, anterior tibial, and posterior tibial arteries, when vessel segments are accessible. Bilateral testing is considered an integral part of a complete examination. Photoelectric Plethysmograph (PPG) waveforms and toe systolic pressure readings are included as required and additional duplex testing as needed. Limited examinations for reoccurring  indications may be performed as noted.    ABI Findings: +---------+------------------+-----+---------+--------+ Right    Rt Pressure (mmHg)IndexWaveform Comment  +---------+------------------+-----+---------+--------+ Brachial 138                    triphasic         +---------+------------------+-----+---------+--------+ PTA      162               1.17 triphasic         +---------+------------------+-----+---------+--------+ DP       163               1.18 triphasic         +---------+------------------+-----+---------+--------+ Great Toe110               0.80                   +---------+------------------+-----+---------+--------+  +---------+------------------+-----+---------+-------+ Left     Lt Pressure (mmHg)IndexWaveform Comment +---------+------------------+-----+---------+-------+ Brachial                                 IV      +---------+------------------+-----+---------+-------+ PTA      157               1.14 triphasic        +---------+------------------+-----+---------+-------+ DP       154               1.12 triphasic        +---------+------------------+-----+---------+-------+ Great Toe114               0.83                  +---------+------------------+-----+---------+-------+  +-------+-----------+-----------+------------+------------+ ABI/TBIToday's ABIToday's TBIPrevious ABIPrevious TBI +-------+-----------+-----------+------------+------------+ Right  1.18       0.8                                 +-------+-----------+-----------+------------+------------+ Left   1.14       0.83                                +-------+-----------+-----------+------------+------------+    Summary: Right: Resting right ankle-brachial index is within normal range. No evidence of significant right lower extremity arterial disease. The right toe-brachial index is normal.  Left: Resting left  ankle-brachial index is within normal range. No evidence of significant left lower extremity  arterial disease. The left toe-brachial index is normal.  *See table(s) above for measurements and observations.    Electronically signed by Jamelle Haring on 11/22/2021 at 12:23:54 PM.      Final   Note: Reviewed        Physical Exam  General appearance: Well nourished, well developed, and well hydrated. In no apparent acute distress Mental status: Alert, oriented x 3 (person, place, & time)       Respiratory: No evidence of acute respiratory distress Eyes: PERLA Vitals: There were no vitals taken for this visit. BMI: Estimated body mass index is 35.43 kg/m as calculated from the following:   Height as of 11/20/21: _0  (1.6 m).   Weight as of 11/20/21: 200 lb (90.7 kg). Ideal: Patient weight not recorded  Assessment   Diagnosis Status  No diagnosis found. Controlled Controlled Controlled   Updated Problems: No problems updated.  Plan of Care  Problem-specific:  No problem-specific Assessment & Plan notes found for this encounter.  Ms. Ashiya Kinkead Rios has a current medication list which includes the following long-term medication(s): calcium carbonate, candesartan, glipizide, ropinirole, and warfarin.  Pharmacotherapy (Medications Ordered): No orders of the defined types were placed in this encounter.  Orders:  No orders of the defined types were placed in this encounter.  Follow-up plan:   No follow-ups on file.     Interventional Therapies  Risk  Complexity Considerations:   Estimated body mass index is 37.45 kg/m as calculated from the following:   Height as of 05/05/21: _1  (1.6 m).   Weight as of 05/13/21: 211 lb 6.4 oz (95.9 kg). 1.  Coumadin anticoagulation  (Stop: 5 days  Re-start: 2hrs) 2.  Latex allergy  3.  Morbid obesity (class III) (BMI>40)  4.  No Lumbar RFA due to morbid obesity    Planned  Pending:   Diagnostic/therapeutic right L4 TFESI #1     Under consideration:   Diagnostic/therapeutic right L4 TFESI #1    Completed:   Diagnostic/therapeutic right IA hip injection x1 (09/15/2021) (85/85/85)  Diagnostic/therapeutic right trochanteric bursa injection x1 (09/15/2021) (85/85/85)  Therapeutic left lumbar facet RFA x1 (07/21/2021) (100/100/100 x1 week/80)  Therapeutic right lumbar facet RFA x1 (07/07/2021) (100/100/80/80)  Palliative/Therapeutic left L4-5 LESI x1 (11/10/2017) (100/80/0/0-25)  Palliative left lumbar facet MBB x17 (05/05/2021) (100/100/75/85)  Palliative right lumbar facet MBB x23 (05/05/2021) (100/100/75/85)  Palliative right SI joint block x8 (not effective) (03/08/2019) (100/100/100/0)  Diagnostic left SI joint block x1 (not effective) (03/08/2019) (100/100/100/0)  Palliative/Therapeutic (Midline) caudal ESI x3 (06/08/2018) (100/100/100 x 2-3 days/0)    Therapeutic  Palliative (PRN) options:   Palliative lumbar facet MBB       Recent Visits Date Type Provider Dept  11/19/21 Procedure visit Milinda Pointer, MD [name]  11/04/21 Office Visit Milinda Pointer, MD [name]  09/29/21 Office Visit Milinda Pointer, MD [name]  09/15/21 Procedure visit Milinda Pointer, MD [name]  Showing recent visits within past 90 days and meeting all other requirements Future Appointments Date Type Provider Dept  12/08/21 Appointment Milinda Pointer, MD [name]  Showing future appointments within next 90 days and meeting all other requirements  I discussed the assessment and treatment plan with the patient. The patient was provided an opportunity to ask questions and all were answered. The patient agreed with the plan and demonstrated an understanding of the instructions.  Patient advised to call back or seek an in-person evaluation if the symptoms or  condition worsens.  Duration of encounter: *** minutes.  Total time on encounter, as per AMA  guidelines included both the face-to-face and non-face-to-face time personally spent by the physician and/or other qualified health care professional(s) on the day of the encounter (includes time in activities that require the physician or other qualified health care professional and does not include time in activities normally performed by clinical staff). Physician's time may include the following activities when performed: preparing to see the patient (eg, review of tests, pre-charting review of records) obtaining and/or reviewing separately obtained history performing a medically appropriate examination and/or evaluation counseling and educating the patient/family/caregiver ordering medications, tests, or procedures referring and communicating with other health care professionals (when not separately reported) documenting clinical information in the electronic or other health record independently interpreting results (not separately reported) and communicating results to the patient/ family/caregiver care coordination (not separately reported)  Note by: Gaspar Cola, MD Date: 12/08/2021; Time: 3:05 PM

## 2021-12-08 ENCOUNTER — Ambulatory Visit (HOSPITAL_BASED_OUTPATIENT_CLINIC_OR_DEPARTMENT_OTHER): Payer: PPO | Admitting: Pain Medicine

## 2021-12-08 DIAGNOSIS — Z91199 Patient's noncompliance with other medical treatment and regimen due to unspecified reason: Secondary | ICD-10-CM

## 2021-12-09 NOTE — Progress Notes (Signed)
PROVIDER NOTE: Information contained herein reflects review and annotations entered in association with encounter. Interpretation of such information and data should be left to medically-trained personnel. Information provided to patient can be located elsewhere in the medical record under "Patient Instructions". Document created using STT-dictation technology, any transcriptional errors that may result from process are unintentional.    Patient: Kara [name]  Service Category: E/M  Provider: Gaspar Cola, MD  DOB: [birth year]  DOS: 12/10/2021  Specialty: Interventional Pain Management  MRN: [id number]  Setting: Ambulatory outpatient  PCP: Tracie Harrier, MD  Type: Established Patient    Referring Provider: Tracie Harrier, MD  Location: Office  Delivery: Face-to-face     HPI  Kara [name], a 71 y.o. year old female, is here today because of her Chronic bilateral low back pain with bilateral sciatica [M54.42, M54.41, G89.29]. Ms. Hammac primary complain today is Back Pain (Lower, right) Last encounter: My last encounter with her was on 12/08/2021. Pertinent problems: Ms. 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); Lumbar pars defect (L3 and L4) (Bilateral); Diabetic peripheral neuropathy ([place]); Chronic lower extremity pain (2ry area of Pain) (Bilateral); Chronic radicular pain of lower extremity; Osteoarthritis of hip (Right); Chronic low back pain (1ry area of  Pain) (Bilateral) w/o sciatica; Spondylosis without myelopathy or radiculopathy, lumbosacral region; Other specified dorsopathies, sacral and sacrococcygeal region; Secondary osteoarthritis of multiple sites; Sacral insufficiency fracture; DDD (degenerative disc disease), lumbosacral; Coccygodynia; Chronic sacroiliac joint pain (Left); Osteoarthritis of lumbar spine; Spondylolisthesis, lumbosacral region; Abnormal MRI, lumbar spine (08/09/2019); Spasm of muscle of lower back; Chronic hip pain (Bilateral) (R>L); Osteoarthritis of hips (Bilateral); Greater trochanteric bursitis (Right); and Chronic hip pain (Right) on their pertinent problem list. Pain Assessment: Severity of Chronic pain is reported as a 9 /10. Location: Back Right, Lower/denies. Onset: More than a month ago. Quality: Larence Penning. Timing: Constant. Modifying factor(s): rest, lying down. Vitals:  height is '5\' 3"'  (1.6 m) and weight is 201 lb (91.2 kg). Her temporal temperature is 97.9 F (36.6 C). Her blood pressure is 112/52 (abnormal) and her pulse is 97. Her respiration is 18 and oxygen saturation is 97%.   Reason for encounter: post-procedure evaluation and assessment.  According to the patient, initially after the procedure she was doing well to the point where she decided to go to the [employer] to have lunch rather than to go home as we had instructed her.  She indicates that when she got out of the car at the [employer] she was doing well and she had absolutely no numbness in her lower extremities however, after she finished lunch, she attempted to stand up and apparently her right leg was numb from the groin area to the knee through the anterior portion of the upper leg (anterior thigh).  Not only was a numb, but she was weak and she lost her balance falling backwards onto the ground where she hit her back in her head also bounced on the floor.  She did not lose consciousness but the fall was bad enough to warrant EMS.  She was  then taken  to [place] [place] where she had a CT scan of her cervical spine and head both of which were negative.  The numbness wore off and she was discharged.  The next day she started having problems with nausea, vomiting, and diarrhea and thinking that she may be having a concussion she went to the hospital where she was evaluated and incidentally found to have a small bowel obstruction with a transition on zone seen within the lower abdomen, along the midline.  The CT scan also showed cholelithiasis, hepatic steatosis, and a large ventral hernia containing fat and a short section of normal-appearing transverse colon, as well as aortic atherosclerosis.  In the CT scan they also mentioned within the musculoskeletal section that she was observed to have a grade 1 anterolisthesis of L4 over L5 with marked severity degenerative changes seen at the L4-5 and L5-S1 levels.  Obviously, the patient after this fall has continued to have pain and did not get any significant improvement from the treatment.  She also refers that she has continued to have diarrhea and they are suspecting that she may be having C. difficile.  At this point, would not plan on doing anything else until all of this gets sorted out, but today I have encouraged the patient to really work as hard as possible in bringing her weight down.  Even though her BMI is only 35.61 kg/m, this # does not really do any justice to her situation since she has massive truncal obesity which is adversely affecting her anterolisthesis and her facet arthropathy.  Not to mention the fact that a prior MRI that she had on 08/09/2019 did show an increase in her L4/L5 anterolisthesis from 5 mm to 7 mm.  Today I personally reviewed the MRI images and it is my opinion that she will be needing some surgery in the near future to deal with some of these areas of stenosis.  Post-procedure evaluation   Type: Trans-Foraminal Epidural Steroid Injection (Lumbar) #1  Laterality: Right  (-RT)  Level: L4 nerve root(s) Imaging: Fluoroscopy-guided Anesthesia: Local anesthesia (1-2% Lidocaine) Anxiolysis: None                 Sedation: None. DOS: 11/19/2021  Performed by: Gaspar Cola, MD  Purpose: Diagnostic/Therapeutic Indications: Lumbar radicular pain severe enough to impact quality of life or function. 1. DDD (degenerative disc disease), lumbosacral   2. Grade 1-2 Anterolisthesis of L4/L5 & L5/S1   3. 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 Chronic lower extremity pain (2ry area of Pain) (Bilateral)   7. Abnormal MRI, lumbar spine (08/09/2019)   8. Chronic anticoagulation (COUMADIN)   9. Latex precautions, history of latex allergy   10. Severe obesity (BMI 35.0-39.9) with comorbidity ([place])    NAS-11 Pain score:   Pre-procedure: 10-Worst pain ever/10   Post-procedure: 8 /10      Effectiveness:  Initial hour after procedure: 99 %. Subsequent 4-6 hours post-procedure: 80 %. Analgesia past initial 6 hours: 0 %. Ongoing improvement:  Analgesic: On the day of the procedure rather than going home like we had instructed her, she decided to [place] [employer] [place].  She describes that there her right leg went numb and she fell backwards hitting her head. Function: Back to baseline ROM: Back to baseline  Pharmacotherapy Assessment  Analgesic: Oxycodone IR 10 mg, 1 tab  PO qd,  from PCP. No chronic opioid analgesics therapy prescribed by our practice, 2ry to Medication Agreement Violation. (Getting oxycodone from PCP in multiple occasions, according to PMP)(11/21/20; 12/26/20; 01/16/21; 02/13/21). Opioid analgesic pharmacotherapy D/C'ed on 03/03/2021. MME: 20 mg/day.   Monitoring: [place] PMP: PDMP reviewed during this encounter.       Pharmacotherapy: No side-effects or adverse reactions reported. Compliance: No problems  identified. Effectiveness: Clinically acceptable.  No notes on file  UDS:  Summary  Date Value Ref Range Status  11/12/2020 Note  Final    Comment:    ==================================================================== [employer] Select 13 (MW) ==================================================================== Test                             Result       Flag       Units  Drug Present and Declared for Prescription Verification   Norhydrocodone                 61           EXPECTED   ng/mg creat    Norhydrocodone is an expected metabolite of hydrocodone.    Tramadol                       >4202        EXPECTED   ng/mg creat   O-Desmethyltramadol            >4202        EXPECTED   ng/mg creat   N-Desmethyltramadol            1712         EXPECTED   ng/mg creat    Source of tramadol is a prescription medication. O-desmethyltramadol    and N-desmethyltramadol are expected metabolites of tramadol.  Drug Absent but Declared for Prescription Verification   Hydrocodone                    Not Detected UNEXPECTED ng/mg creat    Hydrocodone is almost always present in patients taking this drug    consistently. Absence of hydrocodone could be due to lapse of time    since the last dose or unusual pharmacokinetics (rapid metabolism).  ==================================================================== Test                      Result    Flag   Units      Ref Range   Creatinine              119              mg/dL      >=20 ==================================================================== Declared Medications:  The flagging and interpretation on this report are based on the  following declared medications.  Unexpected results may arise from  inaccuracies in the declared medications.   **Note: The testing scope of this panel includes these medications:   Hydrocodone  Tramadol   **Note: The testing scope of this panel does not include the  following reported medications:    Acetaminophen  Alendronate (Fosamax)  Aspirin  Calcium  Candesartan  Cholecalciferol  Cyanocobalamin  Cyclobenzaprine  Dulaglutide  Gabapentin  Glipizide  Meclizine  Multivitamin  Nystatin  Ondansetron  Ropinirole  Tolterodine  Warfarin ==================================================================== For clinical consultation, please call [phone]. ====================================================================      ROS  Constitutional: Denies any fever or chills Gastrointestinal: No reported hemesis, hematochezia, vomiting, or  acute GI distress Musculoskeletal: Denies any acute onset joint swelling, redness, loss of ROM, or weakness Neurological: No reported episodes of acute onset apraxia, aphasia, dysarthria, agnosia, amnesia, paralysis, loss of coordination, or loss of consciousness  Medication Review  Dulaglutide, HYDROcodone-acetaminophen, Vitamin B-12, alendronate, calcium carbonate, candesartan, cholecalciferol, cyclobenzaprine, gabapentin, glipiZIDE, meclizine, multivitamin with minerals, rOPINIRole, traMADol, and warfarin  History Review  Allergy: Ms. Brackeen is allergic to adhesive [tape], other, latex, and morphine and related. Drug: Ms. Greggs  reports no history of drug use. Alcohol:  reports no history of alcohol use. Tobacco:  reports that she has never smoked. She has never used smokeless tobacco. Social: Ms. Abdo  reports that she has never smoked. She has never used smokeless tobacco. She reports that she does not drink alcohol and does not use drugs. 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 Surgical: Ms. Winger  has a past surgical history that includes  Knee surgery; Carpal tunnel release (Bilateral); Colon resection sigmoid (N/A, 11/02/2016); Colostomy (N/A, 11/02/2016); Sigmoidoscopy (N/A, 11/13/2016); PULMONARY VENOGRAPHY (N/A, 11/19/2016); Colonoscopy with propofol (N/A, 02/03/2017); Colon surgery (11/02/2016); IVC FILTER INSERTION (Right, 10/2016); Colostomy reversal (N/A, 02/24/2017); Appendectomy (N/A, 02/24/2017); Lysis of adhesion (N/A, 02/24/2017); laparoscopy (N/A, 02/24/2017); Ileo loop diversion (N/A, 02/24/2017); Sigmoidoscopy (N/A, 05/19/2017); Ileostomy closure (N/A, 06/01/2017); IVC FILTER REMOVAL (N/A, 08/09/2017); Sacroplasty (N/A, 11/08/2017); Cataract extraction w/PHACO (Left, 11/21/2018); Cataract extraction w/PHACO (Right, 12/12/2018); and Video bronchoscopy with endobronchial navigation (N/A, 02/23/2021). Family: family history includes Breast cancer in her mother; Cancer in her mother; Diabetes in her mother and sister; Heart disease in her father, mother, and sister.  Laboratory Chemistry Profile   Renal Lab Results  Component Value Date   BUN 10 11/22/2021   CREATININE 0.56 11/22/2021   GFRAA >60 07/09/2019   GFRNONAA >60 11/22/2021    Hepatic Lab Results  Component Value Date   AST 25 11/20/2021   ALT 31 11/20/2021   ALBUMIN 3.4 (L) 11/20/2021   ALKPHOS 38 11/20/2021   LIPASE 22 11/20/2021    Electrolytes Lab Results  Component Value Date   NA 142 11/22/2021   K 3.7 11/22/2021   CL 112 (H) 11/22/2021   CALCIUM 7.7 (L) 11/22/2021   MG 1.7 08/14/2020   PHOS 3.3 11/07/2016    Bone Lab Results  Component Value Date   25OHVITD1 25 (L) 11/02/2017   25OHVITD2 <1.0 11/02/2017   25OHVITD3 25 11/02/2017    Inflammation (CRP: Acute Phase) (ESR: Chronic Phase) Lab Results  Component Value Date   CRP 1.7 (H) 08/15/2020   ESRSEDRATE 12 11/02/2017   LATICACIDVEN 1.7 11/21/2021         Note: Above Lab results reviewed.  Recent Imaging Review  Inpatient: VAS [place] PAD ABI  LOWER EXTREMITY DOPPLER STUDY  Patient Name:  Feleshia Zundel  Date of Exam:   11/22/2021 Medical Rec #: [id number]              Accession #:    [id number] Date of Birth: 1950-06-18              Patient Gender: F Patient Age:   41 years Exam Location:  [facility] Procedure:      VAS [place] ABI WITH/WO TBI Referring Phys: Myrene Buddy  --------------------------------------------------------------------------------   Indications: Hypertension.  High Risk Factors: Hypertension,  Diabetes.   Limitations: Today's exam was limited due to patient intolerant to cuff              pressure, bandages and Left IV.  Comparison Study: No prior studies.  Performing Technologist: Carlos Levering RVT    Examination Guidelines: A complete evaluation includes at minimum, Doppler waveform signals and systolic blood pressure reading at the level of bilateral brachial, anterior tibial, and posterior tibial arteries, when vessel segments are accessible. Bilateral testing is considered an integral part of a complete examination. Photoelectric Plethysmograph (PPG) waveforms and toe systolic pressure readings are included as required and additional duplex testing as needed. Limited examinations for reoccurring indications may be performed as noted.    ABI Findings: +---------+------------------+-----+---------+--------+ Right    Rt Pressure (mmHg)IndexWaveform Comment  +---------+------------------+-----+---------+--------+ Brachial 138                    triphasic         +---------+------------------+-----+---------+--------+ PTA      162               1.17 triphasic         +---------+------------------+-----+---------+--------+ DP       163               1.18 triphasic         +---------+------------------+-----+---------+--------+ Great Toe110               0.80                   +---------+------------------+-----+---------+--------+  +---------+------------------+-----+---------+-------+ Left      Lt Pressure (mmHg)IndexWaveform Comment +---------+------------------+-----+---------+-------+ Brachial                                 IV      +---------+------------------+-----+---------+-------+ PTA      157               1.14 triphasic        +---------+------------------+-----+---------+-------+ DP       154               1.12 triphasic        +---------+------------------+-----+---------+-------+ Great Toe114               0.83                  +---------+------------------+-----+---------+-------+  +-------+-----------+-----------+------------+------------+ ABI/TBIToday's ABIToday's TBIPrevious ABIPrevious TBI +-------+-----------+-----------+------------+------------+ Right  1.18       0.8                                 +-------+-----------+-----------+------------+------------+ Left   1.14       0.83                                +-------+-----------+-----------+------------+------------+    Summary: Right: Resting right ankle-brachial index is within normal range. No evidence of significant right lower extremity arterial disease. The right toe-brachial index is normal.  Left: Resting left ankle-brachial index is within normal range. No evidence of significant left lower extremity arterial disease. The left toe-brachial index is normal.  *See table(s) above for measurements and observations.    Electronically signed by Jamelle Haring on 11/22/2021 at 12:23:54 PM.      Final   Note: Reviewed  Physical Exam  General appearance: Well nourished, well developed, and well hydrated. In no apparent acute distress Mental status: Alert, oriented x 3 (person, place, & time)       Respiratory: No evidence of acute respiratory distress Eyes: PERLA Vitals: BP (!) 112/52   Pulse 97   Temp 97.9 F (36.6 C) (Temporal)   Resp 18   Ht '5\' 3"'  (1.6 m)   Wt 201 lb (91.2 kg)   SpO2 97%   BMI 35.61 kg/m  BMI: Estimated body mass  index is 35.61 kg/m as calculated from the following:   Height as of this encounter: '5\' 3"'  (1.6 m).   Weight as of this encounter: 201 lb (91.2 kg). Ideal: Ideal body weight: 52.4 kg (115 lb 8.3 oz) Adjusted ideal body weight: 67.9 kg (149 lb 11.4 oz)  Assessment   Diagnosis Status  1. 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 Chronic lower extremity pain (2ry area of Pain) (Bilateral)   3. 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 Lumbar foraminal stenosis (L3-4 and L4-5) (Bilateral)    Controlled Controlled Controlled   Updated Problems: No problems updated.  Plan of Care  Problem-specific:  No problem-specific Assessment & Plan notes found for this encounter.  Ms. Len Kluver Toepfer has a current medication list which includes the following long-term medication(s): candesartan, glipizide, ropinirole, warfarin, and calcium carbonate.  Pharmacotherapy (Medications Ordered): No orders of the defined types were placed in this encounter.  Orders:  No orders of the defined types were placed in this encounter.  Follow-up plan:   Return if symptoms worsen or fail to improve.     Interventional Therapies  Risk  Complexity Considerations:   Estimated body mass index is 37.45 kg/m as calculated from the following:   Height as of 05/05/21: '5\' 3"'  (1.6 m).   Weight as of 05/13/21: 211 lb 6.4 oz (95.9 kg). 1.  Coumadin anticoagulation  (Stop: 5 days  Re-start: 2hrs) 2.  Latex allergy  3.  Morbid obesity (class III) (BMI>40)  4.  No Lumbar RFA due to morbid obesity    Planned  Pending:      Under consideration:      Completed:   Diagnostic/therapeutic right IA hip injection x1 (09/15/2021) (85/85/85)  Diagnostic/therapeutic right trochanteric bursa injection x1 (09/15/2021) (85/85/85)  Therapeutic left lumbar facet RFA x1 (07/21/2021) (100/100/100 x1 week/80)  Therapeutic right lumbar facet RFA x1 (07/07/2021) (100/100/80/80)   Palliative/Therapeutic left L4-5 LESI x1 (11/10/2017) (100/80/0/0-25)  Diagnostic/therapeutic right L4 TFESI x1 (11/19/2021) (90/80/0/0) (fell at [employer]) Palliative left lumbar facet MBB x17 (05/05/2021) (100/100/75/85)  Palliative right lumbar facet MBB x23 (05/05/2021) (100/100/75/85)  Palliative right SI joint block x8 (not effective) (03/08/2019) (100/100/100/0)  Diagnostic left SI joint block x1 (not effective) (03/08/2019) (100/100/100/0)  Palliative/Therapeutic (Midline) caudal ESI x3 (06/08/2018) (100/100/100 x 2-3 days/0)    Therapeutic  Palliative (PRN) options:   None    Recent Visits Date Type Provider Dept  12/08/21 Office Visit Milinda Pointer, MD [name]  11/19/21 Procedure visit Milinda Pointer, MD [name]  11/04/21 Office Visit Milinda Pointer, MD [name]  09/29/21 Office Visit Milinda Pointer, MD [name]  09/15/21 Procedure visit Milinda Pointer, MD [name]  Showing recent visits within past 90 days and meeting all other requirements Today's Visits Date Type Provider Dept  12/10/21 Office Visit Milinda Pointer, MD [name]  Showing today's visits and meeting all  other requirements Future Appointments No visits were found meeting these conditions. Showing future appointments within next 90 days and meeting all other requirements  I discussed the assessment and treatment plan with the patient. The patient was provided an opportunity to ask questions and all were answered. The patient agreed with the plan and demonstrated an understanding of the instructions.  Patient advised to call back or seek an in-person evaluation if the symptoms or condition worsens.  Duration of encounter: 34 minutes.  Total time on encounter, as per AMA guidelines included both the face-to-face and non-face-to-face time personally spent by the physician and/or other qualified health care  professional(s) on the day of the encounter (includes time in activities that require the physician or other qualified health care professional and does not include time in activities normally performed by clinical staff). Physician's time may include the following activities when performed: preparing to see the patient (eg, review of tests, pre-charting review of records) obtaining and/or reviewing separately obtained history performing a medically appropriate examination and/or evaluation counseling and educating the patient/family/caregiver ordering medications, tests, or procedures referring and communicating with other health care professionals (when not separately reported) documenting clinical information in the electronic or other health record independently interpreting results (not separately reported) and communicating results to the patient/ family/caregiver care coordination (not separately reported)  Note by: Gaspar Cola, MD Date: 12/10/2021; Time: 3:42 PM

## 2021-12-10 ENCOUNTER — Ambulatory Visit: Payer: PPO | Attending: Pain Medicine | Admitting: Pain Medicine

## 2021-12-10 ENCOUNTER — Encounter: Payer: Self-pay | Admitting: Pain Medicine

## 2021-12-10 VITALS — BP 112/52 | HR 97 | Temp 97.9°F | Resp 18 | Ht 63.0 in | Wt 201.0 lb

## 2021-12-10 DIAGNOSIS — M79605 Pain in left leg: Secondary | ICD-10-CM | POA: Diagnosis not present

## 2021-12-10 DIAGNOSIS — M48061 Spinal stenosis, lumbar region without neurogenic claudication: Secondary | ICD-10-CM | POA: Diagnosis not present

## 2021-12-10 DIAGNOSIS — R791 Abnormal coagulation profile: Secondary | ICD-10-CM | POA: Diagnosis not present

## 2021-12-10 DIAGNOSIS — M4310 Spondylolisthesis, site unspecified: Secondary | ICD-10-CM | POA: Insufficient documentation

## 2021-12-10 DIAGNOSIS — M79604 Pain in right leg: Secondary | ICD-10-CM | POA: Diagnosis not present

## 2021-12-10 DIAGNOSIS — G8929 Other chronic pain: Secondary | ICD-10-CM | POA: Insufficient documentation

## 2021-12-10 DIAGNOSIS — R197 Diarrhea, unspecified: Secondary | ICD-10-CM | POA: Diagnosis not present

## 2021-12-10 DIAGNOSIS — M5442 Lumbago with sciatica, left side: Secondary | ICD-10-CM | POA: Insufficient documentation

## 2021-12-10 DIAGNOSIS — M5441 Lumbago with sciatica, right side: Secondary | ICD-10-CM | POA: Diagnosis not present

## 2021-12-10 DIAGNOSIS — Z86711 Personal history of pulmonary embolism: Secondary | ICD-10-CM | POA: Diagnosis not present

## 2021-12-11 ENCOUNTER — Ambulatory Visit: Payer: PPO

## 2021-12-15 ENCOUNTER — Ambulatory Visit: Admission: RE | Admit: 2021-12-15 | Payer: PPO | Source: Ambulatory Visit

## 2021-12-15 DIAGNOSIS — Z86711 Personal history of pulmonary embolism: Secondary | ICD-10-CM | POA: Diagnosis not present

## 2021-12-15 DIAGNOSIS — R791 Abnormal coagulation profile: Secondary | ICD-10-CM | POA: Diagnosis not present

## 2021-12-16 ENCOUNTER — Ambulatory Visit
Admission: RE | Admit: 2021-12-16 | Discharge: 2021-12-16 | Disposition: A | Discharge status: Home / Self Care | Payer: PPO | Source: Ambulatory Visit | Attending: Radiation Oncology | Admitting: Radiation Oncology

## 2021-12-16 DIAGNOSIS — C3411 Malignant neoplasm of upper lobe, right bronchus or lung: Secondary | ICD-10-CM | POA: Diagnosis not present

## 2021-12-16 DIAGNOSIS — C3490 Malignant neoplasm of unspecified part of unspecified bronchus or lung: Secondary | ICD-10-CM | POA: Diagnosis not present

## 2021-12-16 DIAGNOSIS — R918 Other nonspecific abnormal finding of lung field: Secondary | ICD-10-CM | POA: Diagnosis not present

## 2021-12-16 DIAGNOSIS — J181 Lobar pneumonia, unspecified organism: Secondary | ICD-10-CM | POA: Diagnosis not present

## 2021-12-16 MED ORDER — IOHEXOL 300 MG/ML  SOLN
75.0000 mL | Freq: Once | INTRAMUSCULAR | Status: AC | PRN
  Administered 2021-12-16: 75 mL via INTRAVENOUS

## 2021-12-22 DIAGNOSIS — R791 Abnormal coagulation profile: Secondary | ICD-10-CM | POA: Diagnosis not present

## 2021-12-22 DIAGNOSIS — Z7901 Long term (current) use of anticoagulants: Secondary | ICD-10-CM | POA: Diagnosis not present

## 2021-12-24 ENCOUNTER — Inpatient Hospital Stay: Payer: PPO | Admitting: Oncology

## 2022-01-07 ENCOUNTER — Telehealth: Payer: Self-pay | Admitting: Pain Medicine

## 2022-01-07 ENCOUNTER — Other Ambulatory Visit: Payer: Self-pay | Admitting: Pain Medicine

## 2022-01-07 DIAGNOSIS — M4310 Spondylolisthesis, site unspecified: Secondary | ICD-10-CM

## 2022-01-07 DIAGNOSIS — G8929 Other chronic pain: Secondary | ICD-10-CM

## 2022-01-07 DIAGNOSIS — M47816 Spondylosis without myelopathy or radiculopathy, lumbar region: Secondary | ICD-10-CM

## 2022-01-07 NOTE — Telephone Encounter (Signed)
Called patient and she is wanting a facet block for her back (right side). Is she able to have one, if so will you place an order?

## 2022-01-07 NOTE — Telephone Encounter (Signed)
Patient wants to know if she can get injections done in back. Please give patient a call. Thanks

## 2022-01-08 DIAGNOSIS — R791 Abnormal coagulation profile: Secondary | ICD-10-CM | POA: Diagnosis not present

## 2022-01-08 DIAGNOSIS — Z86711 Personal history of pulmonary embolism: Secondary | ICD-10-CM | POA: Diagnosis not present

## 2022-01-26 ENCOUNTER — Telehealth: Payer: Self-pay

## 2022-01-26 NOTE — Telephone Encounter (Signed)
Spoke with patient and she states she wants to have an IV.  Asked whether here in the office or ECT and she said in the office.

## 2022-01-26 NOTE — Progress Notes (Addendum)
PROVIDER NOTE: Interpretation of information contained herein should be left to medically-trained personnel. Specific patient instructions are provided elsewhere under "Patient Instructions" section of medical record. This document was created in part using STT-dictation technology, any transcriptional errors that may result from this process are unintentional.  Patient: Kara [name] Type: Established DOB: [birth year] MRN: [id number] PCP: Tracie Harrier, MD  Service: Procedure DOS: 01/28/2022 Setting: Ambulatory Location: Ambulatory outpatient facility Delivery: Face-to-face Provider: Gaspar Cola, MD Specialty: Interventional Pain Management Specialty designation: 09 Location: Outpatient facility Ref. Prov.: Tracie Harrier, MD    Procedure:               Type:  Intra-articular hip & peri-articular bursae injection   R2L1    Laterality: Bilateral (-50)  Level: Lower pelvic and hip joint level.  Imaging: Fluoroscopy-guided         Anesthesia: Local anesthesia (1-2% Lidocaine) Anxiolysis: None                 Sedation: No Sedation                       DOS: 01/28/2022  Performed by: Gaspar Cola, MD  Purpose: Diagnostic/Therapeutic Indications: Hip pain severe enough to impact quality of life or function. Rationale (medical necessity): procedure needed and proper for the diagnosis and/or treatment of [name]'s medical symptoms and needs. 1. Chronic hip pain (Bilateral) (R>L)   2. Osteoarthritis of hips (Bilateral)   3. Greater trochanteric bursitis (Left)   4. Other bursitis of hip, not elsewhere classified (gluteus medius bursa) (Left)    Chronic anticoagulation (COUMADIN)    Latex precautions, history of latex allergy    Severe obesity with body mass index (BMI) of 35.0 to 39.9 with comorbidity (HCC)    NAS-11 Pain score:   Pre-procedure: 9 /10   Post-procedure: 2 /10   Note: The patient requested to come in for a Lumbar facet block however, I  was skeptical about that since she had a left lumbar facet RFA done on 07/21/2021 with the right side done on 07/07/2021.  Upon arrival to the clinic I performed a physical exam on the patient and he was exquisitely positive for bilateral hip joint arthralgia with bilateral decreased range of motion and pain on movement which was significant on the left side compared to the right.  The patient indicates the pain to be primarily in the posterior aspect of the hip joint with pain to palpation on the lateral aspect of the left leg, over the trochanteric bursa.  Bilateral provocative Saralyn Pilar maneuver was negative for sacroiliac joint pain but positive for bilateral hip joint arthralgia.  Based on the findings, the decision was made to change the planned procedure to a bilateral hip joint injection.  Interestingly, on 09/15/2021 we had to do the right side due to a similar problem.  With that particular procedure the patient indicated having attained 100% relief of the pain for the duration of the local anesthetic followed by a decrease to a 65% improvement that lasted only for 2 days.  X-rays done on 11/19/2021 show degenerative changes of the right hip with moderate right hip osteoarthritis.   Intra-articular Target: Superior aspect of the hip joint cavity, going thru the superior portion of the capsular ligament.  Bursae Target: Gluteus Medius Bursa (Left) Location: Intra-articular & peri-articular Region: Hip joint, upper (proximal) femoral region Approach: Percutaneous posterolateral approach. Type of procedure: Percutaneous joint injection   Position / Prep / Materials:  Position: Prone  Prep solution: DuraPrep (Iodine Povacrylex [0.7% available iodine] and Isopropyl Alcohol, 74% w/w) Prep Area:  Entire Posterolateral hip area. Materials:  Tray: Block tray Needle(s):  Type: Spinal  Gauge (G): 22  Length: 7-in  Qty: 2  Imaging Guidance (Non-Spinal):          Type of Imaging Technique: Fluoroscopy  Guidance (Non-Spinal) Indication(s): Assistance in needle guidance and placement for procedures requiring needle placement in or near specific anatomical locations not easily accessible without such assistance. Exposure Time: Please see nurses notes. Contrast: Before injecting any contrast, we confirmed that the patient did not have an allergy to iodine, shellfish, or radiological contrast. Once satisfactory needle placement was completed at the desired level, radiological contrast was injected. Contrast injected under live fluoroscopy. No contrast complications. See chart for type and volume of contrast used. Fluoroscopic Guidance: I was personally present during the use of fluoroscopy. "Tunnel Vision Technique" used to obtain the best possible view of the target area. Parallax error corrected before commencing the procedure. "Direction-depth-direction" technique used to introduce the needle under continuous pulsed fluoroscopy. Once target was reached, antero-posterior, oblique, and lateral fluoroscopic projection used confirm needle placement in all planes. Images permanently stored in EMR. Interpretation: I personally interpreted the imaging intraoperatively. Adequate needle placement confirmed in multiple planes. Appropriate spread of contrast into desired area was observed. No evidence of afferent or efferent intravascular uptake. Permanent images saved into the patient's record.  Pre-Procedure Preparation:  Monitoring: As per clinic protocol. Respiration, ETCO2, SpO2, BP, heart rate and rhythm monitor placed and checked for adequate function Safety Precautions: Patient was assessed for positional comfort and pressure points before starting the procedure. Time-out: I initiated and conducted the "Time-out" before starting the procedure, as per protocol. The patient was asked to participate by confirming the accuracy of the "Time Out" information. Verification of the correct person, site, and procedure  were performed and confirmed by me, the nursing staff, and the patient. "Time-out" conducted as per Joint Commission's Universal Protocol (UP.01.01.01). Time: 1120  Description/Narrative of Procedure:          Rationale (medical necessity): procedure needed and proper for the diagnosis and/or treatment of the patient's medical symptoms and needs. Procedural Technique Safety Precautions: Aspiration looking for blood return was conducted prior to all injections. At no point did we inject any substances, as a needle was being advanced. No attempts were made at seeking any paresthesias. Safe injection practices and needle disposal techniques used. Medications properly checked for expiration dates. SDV (single dose vial) medications used. Description of the Procedure: Protocol guidelines were followed. The patient was assisted into a comfortable position. The target area was identified and the area prepped in the usual manner. Skin & deeper tissues infiltrated with local anesthetic. Appropriate amount of time allowed to pass for local anesthetics to take effect. The procedure needles were then advanced to the target area. Proper needle placement secured. Negative aspiration confirmed. Solution injected in intermittent fashion, asking for systemic symptoms every 0.5cc of injectate. The needles were then removed and the area cleansed, making sure to leave some of the prepping solution back to take advantage of its long term bactericidal properties.  Technical description of procedure:  Skin & deeper tissues infiltrated with local anesthetic. Appropriate amount of time allowed to pass for local anesthetics to take effect. The procedure needles were then advanced to the target area. Proper needle placement secured. Negative aspiration confirmed. Solution injected in intermittent fashion, asking for systemic symptoms every 0.5cc  of injectate. The needles were then removed and the area cleansed, making sure to leave  some of the prepping solution back to take advantage of its long term bactericidal properties.           Vitals:   01/28/22 1120 01/28/22 1125 01/28/22 1129 01/28/22 1136  BP: (!) 146/89 (!) 147/91 128/89 136/83  Pulse:      Resp: 18 17 17    Temp:      TempSrc:      SpO2: 96% 93% 99% 100%  Weight:      Height:         Start Time: 1120 hrs. End Time: 1128 hrs.  Post-operative Assessment:  Post-procedure Vital Signs:  Pulse/HCG Rate: 8991 Temp: 98.1 F (36.7 C) Resp: 17 BP: 136/83 SpO2: 100 %  EBL: None  Complications: No immediate post-treatment complications observed by team, or reported by patient.  Note: The patient tolerated the entire procedure well. A repeat set of vitals were taken after the procedure and the patient was kept under observation following institutional policy, for this type of procedure. Post-procedural neurological assessment was performed, showing return to baseline, prior to discharge. The patient was provided with post-procedure discharge instructions, including a section on how to identify potential problems. Should any problems arise concerning this procedure, the patient was given instructions to immediately contact us, at any time, without hesitation. In any case, we plan to contact the patient by telephone for a follow-up status report regarding this interventional procedure.  Comments:  No additional relevant information.  Plan of Care  Orders:  Orders Placed This Encounter  Procedures   HIP INJECTION    Scheduling Instructions:     Side: Bilateral     Sedation: Patient's choice.     Timeframe: Today   DG PAIN [facility] C-ARM 1-60 MIN NO REPORT    Intraoperative interpretation by procedural physician at [facility].    Standing Status:   Standing    Number of Occurrences:   1    Order Specific Question:   Reason for exam:    Answer:   Assistance in needle guidance and placement for procedures requiring needle placement in or  near specific anatomical locations not easily accessible without such assistance.   Care order/instruction: Please confirm that the patient has stopped the Coumadin (Warfarin) X 5 days prior to procedure or surgery.    Please confirm that the patient has stopped the Coumadin (Warfarin) X 5 days prior to procedure or surgery.    Standing Status:   Standing    Number of Occurrences:   1   Provide equipment / supplies at bedside    "Block Tray" (Disposable  single use) Needle type: SpinalSpinal Amount/quantity: 2 Size: Medium (7-inch) Gauge: 22G    Standing Status:   Standing    Number of Occurrences:   1    Order Specific Question:   Specify    Answer:   Block Tray   Informed Consent Details: Physician/Practitioner Attestation; Transcribe to consent form and obtain patient signature    Nursing Order: Transcribe to consent form and obtain patient signature. Note: Always confirm laterality of pain with Ms. Jeangilles, before procedure.    Order Specific Question:   Physician/Practitioner attestation of informed consent for procedure/surgical case    Answer:   I, the physician/practitioner, attest that I have discussed with the patient the benefits, risks, side effects, alternatives, likelihood of achieving goals and potential problems during recovery for the procedure that I have provided  informed consent.    Order Specific Question:   Procedure    Answer:   Hip injection    Order Specific Question:   Physician/Practitioner performing the procedure    Answer:   Wendle Kina A. Dossie Arbour, MD    Order Specific Question:   Indication/Reason    Answer:   Hip Joint Pain (Arthralgia)   Bleeding precautions    Standing Status:   Standing    Number of Occurrences:   1   Latex precautions    Activate Latex-Free Protocol.    Standing Status:   Standing    Number of Occurrences:   1   Chronic Opioid Analgesic:  Oxycodone IR 10 mg, 1 tab PO qd,  from PCP. No chronic opioid analgesics therapy prescribed  by our practice, 2ry to Medication Agreement Violation. (Getting oxycodone from PCP in multiple occasions, according to PMP)(11/21/20; 12/26/20; 01/16/21; 02/13/21). Opioid analgesic pharmacotherapy D/C'ed on 03/03/2021. MME: 20 mg/day.   Medications ordered for procedure: Meds ordered this encounter  Medications   lidocaine (XYLOCAINE) 2 % (with pres) injection 400 mg   pentafluoroprop-tetrafluoroeth (GEBAUERS) aerosol   lactated ringers infusion   midazolam (VERSED) 5 MG/5ML injection 0.5-2 mg    Make sure Flumazenil is available in the pyxis when using this medication. If oversedation occurs, administer 0.2 mg IV over 15 sec. If after 45 sec no response, administer 0.2 mg again over 1 min; may repeat at 1 min intervals; not to exceed 4 doses (1 mg)   DISCONTD: ropivacaine (PF) 2 mg/mL (0.2%) (NAROPIN) injection 9 mL   DISCONTD: triamcinolone acetonide (KENALOG-40) injection 40 mg   ropivacaine (PF) 2 mg/mL (0.2%) (NAROPIN) injection 8 mL   ropivacaine (PF) 2 mg/mL (0.2%) (NAROPIN) injection 18 mL   methylPREDNISolone acetate (DEPO-MEDROL) injection 160 mg   iohexol (OMNIPAQUE) 180 MG/ML injection 10 mL    Must be Myelogram-compatible. If not available, you may substitute with a water-soluble, non-ionic, hypoallergenic, myelogram-compatible radiological contrast medium.   Medications administered: We administered lidocaine, pentafluoroprop-tetrafluoroeth, lactated ringers, midazolam, ropivacaine (PF) 2 mg/mL (0.2%), and methylPREDNISolone acetate.  See the medical record for exact dosing, route, and time of administration.  Follow-up plan:   Return in about 2 weeks (around 02/11/2022) for Proc-day (T,Th), (VV), (PPE).       Interventional Therapies  Risk  Complexity Considerations:   Estimated body mass index is 37.45 kg/m as calculated from the following:   Height as of 05/05/21: 5\' 3"  (1.6 m).   Weight as of 05/13/21: 211 lb 6.4 oz (95.9 kg). 1.  Coumadin anticoagulation  (Stop: 5 days   Re-start: 2hrs) 2.  Latex allergy  3.  Morbid obesity (class III) (BMI>40)  4.  No Lumbar RFA due to morbid obesity    Planned  Pending:      Under consideration:   Consider bilateral hip joint MRI    Completed:   Diagnostic/therapeutic right IA hip inj. x2 (01/28/2022)  Diagnostic/therapeutic left IA hip inj. x1 (01/28/2022)  Diagnostic/therapeutic right trochanteric bursa inj. x1 (09/15/2021) (85/85/85)  Diagnostic/therapeutic left trochanteric bursa inj. x1 (01/28/2022)  Therapeutic left lumbar facet RFA x1 (07/21/2021) (100/100/100 x1 week/80)  Therapeutic right lumbar facet RFA x1 (07/07/2021) (100/100/80/80)  Palliative/Therapeutic left L4-5 LESI x1 (11/10/2017) (100/80/0/0-25)  Diagnostic/therapeutic right L4 TFESI x1 (11/19/2021) (90/80/0/0) (fell at [employer]) Palliative left lumbar facet MBB x17 (05/05/2021) (100/100/75/85)  Palliative right lumbar facet MBB x23 (05/05/2021) (100/100/75/85)  Palliative right SI joint block x8 (not effective) (03/08/2019) (100/100/100/0)  Diagnostic left SI joint block  x1 (not effective) (03/08/2019) (100/100/100/0)  Palliative/Therapeutic (Midline) caudal ESI x3 (06/08/2018) (100/100/100 x 2-3 days/0)    Therapeutic  Palliative (PRN) options:   NO PRN procedures without F2F exam by Dr. Dossie Arbour    Recent Visits Date Type Provider Dept  12/10/21 Office Visit Milinda Pointer, MD [name]  11/19/21 Procedure visit Milinda Pointer, MD [name]  11/04/21 Office Visit Milinda Pointer, MD [name]  Showing recent visits within past 90 days and meeting all other requirements Today's Visits Date Type Provider Dept  01/28/22 Procedure visit Milinda Pointer, MD [name]  Showing today's visits and meeting all other requirements Future Appointments Date Type Provider Dept  02/11/22 Appointment Milinda Pointer, MD [name]  Showing future appointments within next 90  days and meeting all other requirements  Disposition: Discharge home  Discharge (Date  Time): 01/28/2022; 1145 hrs.   Primary Care Physician: Tracie Harrier, MD Location: [facility] Note by: Gaspar Cola, MD Date: 01/28/2022; Time: 1:20 PM  Disclaimer:  Medicine is not an exact science. The only guarantee in medicine is that nothing is guaranteed. It is important to note that the decision to proceed with this intervention was based on the information collected from the patient. The Data and conclusions were drawn from the patient's questionnaire, the interview, and the physical examination. Because the information was provided in large part by the patient, it cannot be guaranteed that it has not been purposely or unconsciously manipulated. Every effort has been made to obtain as much relevant data as possible for this evaluation. It is important to note that the conclusions that lead to this procedure are derived in large part from the available data. Always take into account that the treatment will also be dependent on availability of resources and existing treatment guidelines, considered by other Pain Management Practitioners as being common knowledge and practice, at the time of the intervention. For Medico-Legal purposes, it is also important to point out that variation in procedural techniques and pharmacological choices are the acceptable norm. The indications, contraindications, technique, and results of the above procedure should only be interpreted and judged by a Board-Certified Interventional Pain Specialist with extensive familiarity and expertise in the same exact procedure and technique.

## 2022-01-28 ENCOUNTER — Encounter: Payer: Self-pay | Admitting: Pain Medicine

## 2022-01-28 ENCOUNTER — Ambulatory Visit: Payer: Medicaid Other | Attending: Pain Medicine | Admitting: Pain Medicine

## 2022-01-28 ENCOUNTER — Ambulatory Visit
Admission: RE | Admit: 2022-01-28 | Discharge: 2022-01-28 | Disposition: A | Discharge status: Home / Self Care | Payer: Medicaid Other | Source: Ambulatory Visit | Attending: Pain Medicine | Admitting: Pain Medicine

## 2022-01-28 VITALS — BP 136/83 | HR 89 | Temp 98.1°F | Resp 17 | Ht 63.0 in | Wt 199.0 lb

## 2022-01-28 DIAGNOSIS — M25551 Pain in right hip: Secondary | ICD-10-CM | POA: Insufficient documentation

## 2022-01-28 DIAGNOSIS — M545 Low back pain, unspecified: Secondary | ICD-10-CM

## 2022-01-28 DIAGNOSIS — M5137 Other intervertebral disc degeneration, lumbosacral region: Secondary | ICD-10-CM

## 2022-01-28 DIAGNOSIS — M7062 Trochanteric bursitis, left hip: Secondary | ICD-10-CM

## 2022-01-28 DIAGNOSIS — G8929 Other chronic pain: Secondary | ICD-10-CM

## 2022-01-28 DIAGNOSIS — M47816 Spondylosis without myelopathy or radiculopathy, lumbar region: Secondary | ICD-10-CM

## 2022-01-28 DIAGNOSIS — Z7901 Long term (current) use of anticoagulants: Secondary | ICD-10-CM

## 2022-01-28 DIAGNOSIS — Z91040 Latex allergy status: Secondary | ICD-10-CM

## 2022-01-28 DIAGNOSIS — M25552 Pain in left hip: Secondary | ICD-10-CM | POA: Insufficient documentation

## 2022-01-28 DIAGNOSIS — M71552 Other bursitis, not elsewhere classified, left hip: Secondary | ICD-10-CM

## 2022-01-28 DIAGNOSIS — M47817 Spondylosis without myelopathy or radiculopathy, lumbosacral region: Secondary | ICD-10-CM

## 2022-01-28 DIAGNOSIS — M160 Bilateral primary osteoarthritis of hip: Secondary | ICD-10-CM

## 2022-01-28 DIAGNOSIS — M4310 Spondylolisthesis, site unspecified: Secondary | ICD-10-CM

## 2022-01-28 MED ORDER — ROPIVACAINE HCL 2 MG/ML IJ SOLN: 18.0000 mL | Freq: Once | INTRAMUSCULAR | Status: DC

## 2022-01-28 MED ORDER — ROPIVACAINE HCL 2 MG/ML IJ SOLN: 9.0000 mL | Freq: Once | INTRAMUSCULAR | Status: DC

## 2022-01-28 MED ORDER — IOHEXOL 180 MG/ML  SOLN: 10.0000 mL | Freq: Once | INTRAMUSCULAR | Status: DC

## 2022-01-28 MED ORDER — LIDOCAINE HCL 2 % IJ SOLN
20.0000 mL | Freq: Once | INTRAMUSCULAR | Status: AC
  Administered 2022-01-28: 400 mg
  Filled 2022-01-28: qty 40

## 2022-01-28 MED ORDER — TRIAMCINOLONE ACETONIDE 40 MG/ML IJ SUSP: 40.0000 mg | Freq: Once | INTRAMUSCULAR | Status: DC

## 2022-01-28 MED ORDER — METHYLPREDNISOLONE ACETATE 80 MG/ML IJ SUSP
160.0000 mg | Freq: Once | INTRAMUSCULAR | Status: AC
  Administered 2022-01-28: 160 mg via INTRA_ARTICULAR
  Filled 2022-01-28: qty 2

## 2022-01-28 MED ORDER — LACTATED RINGERS IV SOLN: Freq: Once | INTRAVENOUS | Status: AC

## 2022-01-28 MED ORDER — MIDAZOLAM HCL 5 MG/5ML IJ SOLN
0.5000 mg | Freq: Once | INTRAMUSCULAR | Status: AC
  Administered 2022-01-28: 2 mg via INTRAVENOUS
  Filled 2022-01-28: qty 5

## 2022-01-28 MED ORDER — ROPIVACAINE HCL 2 MG/ML IJ SOLN
8.0000 mL | Freq: Once | INTRAMUSCULAR | Status: AC
  Administered 2022-01-28: 8 mL via INTRA_ARTICULAR
  Filled 2022-01-28: qty 20

## 2022-01-28 MED ORDER — PENTAFLUOROPROP-TETRAFLUOROETH EX AERO
INHALATION_SPRAY | Freq: Once | CUTANEOUS | Status: AC
  Administered 2022-01-28: 30 via TOPICAL

## 2022-01-28 NOTE — Patient Instructions (Signed)
____________________________________________________________________________________________  Post-Procedure Discharge Instructions  Instructions: Apply ice:  Purpose: This will minimize any swelling and discomfort after procedure.  When: Day of procedure, as soon as you get home. How: Fill a plastic sandwich bag with crushed ice. Cover it with a small towel and apply to injection site. How long: (15 min on, 15 min off) Apply for 15 minutes then remove x 15 minutes.  Repeat sequence on day of procedure, until you go to bed. Apply heat:  Purpose: To treat any soreness and discomfort from the procedure. When: Starting the next day after the procedure. How: Apply heat to procedure site starting the day following the procedure. How long: May continue to repeat daily, until discomfort goes away. Food intake: Start with clear liquids (like water) and advance to regular food, as tolerated.  Physical activities: Keep activities to a minimum for the first 8 hours after the procedure. After that, then as tolerated. Driving: If you have received any sedation, be responsible and do not drive. You are not allowed to drive for 24 hours after having sedation. Blood thinner: (Applies only to those taking blood thinners) You may restart your blood thinner 6 hours after your procedure. Insulin: (Applies only to Diabetic patients taking insulin) As soon as you can eat, you may resume your normal dosing schedule. Infection prevention: Keep procedure site clean and dry. Shower daily and clean area with soap and water. Post-procedure Pain Diary: Extremely important that this be done correctly and accurately. Recorded information will be used to determine the next step in treatment. For the purpose of accuracy, follow these rules: Evaluate only the area treated. Do not report or include pain from an untreated area. For the purpose of this evaluation, ignore all other areas of pain, except for the treated area. After  your procedure, avoid taking a long nap and attempting to complete the pain diary after you wake up. Instead, set your alarm clock to go off every hour, on the hour, for the initial 8 hours after the procedure. Document the duration of the numbing medicine, and the relief you are getting from it. Do not go to sleep and attempt to complete it later. It will not be accurate. If you received sedation, it is likely that you were given a medication that may cause amnesia. Because of this, completing the diary at a later time may cause the information to be inaccurate. This information is needed to plan your care. Follow-up appointment: Keep your post-procedure follow-up evaluation appointment after the procedure (usually 2 weeks for most procedures, 6 weeks for radiofrequencies). DO NOT FORGET to bring you pain diary with you.   Expect: (What should I expect to see with my procedure?) From numbing medicine (AKA: Local Anesthetics): Numbness or decrease in pain. You may also experience some weakness, which if present, could last for the duration of the local anesthetic. Onset: Full effect within 15 minutes of injected. Duration: It will depend on the type of local anesthetic used. On the average, 1 to 8 hours.  From steroids (Applies only if steroids were used): Decrease in swelling or inflammation. Once inflammation is improved, relief of the pain will follow. Onset of benefits: Depends on the amount of swelling present. The more swelling, the longer it will take for the benefits to be seen. In some cases, up to 10 days. Duration: Steroids will stay in the system x 2 weeks. Duration of benefits will depend on multiple posibilities including persistent irritating factors. Side-effects: If present, they  may typically last 2 weeks (the duration of the steroids). Frequent: Cramps (if they occur, drink Gatorade and take over-the-counter Magnesium 450-500 mg once to twice a day); water retention with temporary  weight gain; increases in blood sugar; decreased immune system response; increased appetite. Occasional: Facial flushing (red, warm cheeks); mood swings; menstrual changes. Uncommon: Long-term decrease or suppression of natural hormones; bone thinning. (These are more common with higher doses or more frequent use. This is why we prefer that our patients avoid having any injection therapies in other practices.)  Very Rare: Severe mood changes; psychosis; aseptic necrosis. From procedure: Some discomfort is to be expected once the numbing medicine wears off. This should be minimal if ice and heat are applied as instructed.  Call if: (When should I call?) You experience numbness and weakness that gets worse with time, as opposed to wearing off. New onset bowel or bladder incontinence. (Applies only to procedures done in the spine)  Emergency Numbers: Durning business hours (Monday - Thursday, 8:00 AM - 4:00 PM) (Friday, 9:00 AM - 12:00 Noon): [phone] After hours: [phone] NOTE: If you are having a problem and are unable connect with, or to talk to a provider, then go to your nearest urgent care or emergency department. If the problem is serious and urgent, please call 911. ____________________________________________________________________________________________   ______________________________________________________________________________________________  Body mass index (BMI)  Body mass index (BMI) is a common tool for deciding whether a person has an appropriate body weight.  It measures a persons weight in relation to their height.   According to the [facility] of health (NIH): A BMI of less than 18.5 means that a person is underweight. A BMI of between 18.5 and 24.9 is ideal. A BMI of between 25 and 29.9 is overweight. A BMI over 30 indicates obesity.  Weight Management Required  URGENT: Your weight has been found to be adversely affecting your health.  Dear  Kara [name]:  Your current Estimated body mass index is 35.25 kg/m as calculated from the following:   Height as of this encounter: $RemoveBeforeD'5\' 3"'[id number]$  (1.6 m).   Weight as of this encounter: 199 lb (90.3 kg).  Please use the table below to identify your weight category and associated incidence of chronic pain, secondary to your weight.  Body Mass Index (BMI) Classification BMI level (kg/m2) Category Associated incidence of chronic pain  <18  Underweight   18.5-24.9 Ideal body weight   25-29.9 Overweight  20%  30-34.9 Obese (Class I)  68%  35-39.9 Severe obesity (Class II)  136%  >40 Extreme obesity (Class III)  254%   In addition: You will be considered "Morbidly Obese", if your BMI is above 30 and you have one or more of the following conditions which are known to be caused and/or directly associated with obesity: 1.    Type 2 Diabetes (Which in turn can lead to cardiovascular diseases (CVD), stroke, peripheral vascular diseases (PVD), retinopathy, nephropathy, and neuropathy) 2.    Cardiovascular Disease (High Blood Pressure; Congestive Heart Failure; High Cholesterol; Coronary Artery Disease; Angina; or History of Heart Attacks) 3.    Breathing problems (Asthma; obesity-hypoventilation syndrome; obstructive sleep apnea; chronic inflammatory airway disease; reactive airway disease; or shortness of breath) 4.    Chronic kidney disease 5.    Liver disease (nonalcoholic fatty liver disease) 6.    High blood pressure 7.    Acid reflux (gastroesophageal reflux disease; heartburn) 8.    Osteoarthritis (OA) (with any of the following: hip  pain; knee pain; and/or low back pain) 9.    Low back pain (Lumbar Facet Syndrome; and/or Degenerative Disc Disease) 10.  Hip pain (Osteoarthritis of hip) (For every 1 lbs of added body weight, there is a 2 lbs increase in pressure inside of each hip articulation. 1:2 mechanical relationship) 11.  Knee pain (Osteoarthritis of knee) (For every 1 lbs of added body weight,  there is a 4 lbs increase in pressure inside of each knee articulation. 1:4 mechanical relationship) (patients with a BMI>30 kg/m2 were 6.8 times more likely to develop knee OA than normal-weight individuals) 12.  Cancer: Epidemiological studies have shown that obesity is a risk factor for: post-menopausal breast cancer; cancers of the endometrium, colon and kidney cancer; malignant adenomas of the oesophagus. Obese subjects have an approximately 1.5-3.5-fold increased risk of developing these cancers compared with normal-weight subjects, and it has been estimated that between 15 and 45% of these cancers can be attributed to overweight. More recent studies suggest that obesity may also increase the risk of other types of cancer, including pancreatic, hepatic and gallbladder cancer. (Ref: Obesity and cancer. Pischon T, Nthlings U, [employer] H. Proc Nutr Soc. 2008 May;67(2):128-45. doi: 95.6213/[id number].) The International Agency for Research on Cancer (IARC) has identified 13 cancers associated with overweight and obesity: meningioma, multiple myeloma, adenocarcinoma of the esophagus, and cancers of the thyroid, postmenopausal breast cancer, gallbladder, stomach, liver, pancreas, kidney, ovaries, uterus, colon and rectal (colorectal) cancers. 32 percent of all cancers diagnosed in women and 24 percent of those diagnosed in men are associated with overweight and obesity.  Recommendation: At this point it is urgent that you take a step back and concentrate in loosing weight. Dedicate 100% of your efforts on this task. Nothing else will improve your health more than bringing your weight down and your BMI to less than 30. If you are here, you probably have chronic pain. We know that most chronic pain patients have difficulty exercising secondary to their pain. For this reason, you must rely on proper nutrition and diet in order to lose the weight. If your BMI is above 40, you should seriously consider bariatric  surgery. A realistic goal is to lose 10% of your body weight over a period of 12 months.  Be honest to yourself, if over time you have unsuccessfully tried to lose weight, then it is time for you to seek professional help and to enter a medically supervised weight management program, and/or undergo bariatric surgery. Stop procrastinating.   Pain management considerations:  1.    Pharmacological Problems: Be advised that the use of opioid analgesics (oxycodone; hydrocodone; morphine; methadone; codeine; and all of their derivatives) have been associated with decreased metabolism and weight gain.  For this reason, should we see that you are unable to lose weight while taking these medications, it may become necessary for [place] to taper down and indefinitely discontinue them.  2.    Technical Problems: The incidence of successful interventional therapies decreases as the patient's BMI increases. It is much more difficult to accomplish a safe and effective interventional therapy on a patient with a BMI above 35. 3.    Radiation Exposure Problems: The x-rays machine, used to accomplish injection therapies, will automatically increase their x-ray output in order to capture an appropriate bone image. This means that radiation exposure increases exponentially with the patient's BMI. (The higher the BMI, the higher the radiation exposure.) Although the level of radiation used at a given time is still safe to the  patient, it is not for the physician and/or assisting staff. Unfortunately, radiation exposure is accumulative. Because physicians and the staff have to do procedures and be exposed on a daily basis, this can result in health problems such as cancer and radiation burns. Radiation exposure to the staff is monitored by the radiation batches that they wear. The exposure levels are reported back to the staff on a quarterly basis. Depending on levels of exposure, physicians and staff may be obligated by law to decrease  this exposure. This means that they have the right and obligation to refuse providing therapies where they may be overexposed to radiation. For this reason, physicians may decline to offer therapies such as radiofrequency ablation or implants to patients with a BMI above 40. 4.    Current Trends: Be advised that the current trend is to no longer offer certain therapies to patients with a BMI equal to, or above 35, due to increase perioperative risks, increased technical procedural difficulties, and excessive radiation exposure to healthcare personnel.  ______________________________________________________________________________________________

## 2022-01-29 ENCOUNTER — Telehealth: Payer: Self-pay

## 2022-01-29 NOTE — Telephone Encounter (Signed)
Post procedure phone call.  Patient states she is doing ok.  

## 2022-02-04 DIAGNOSIS — R791 Abnormal coagulation profile: Secondary | ICD-10-CM | POA: Diagnosis not present

## 2022-02-10 ENCOUNTER — Encounter: Payer: Self-pay | Admitting: Pain Medicine

## 2022-02-11 ENCOUNTER — Ambulatory Visit: Payer: Medicaid Other | Attending: Pain Medicine | Admitting: Pain Medicine

## 2022-02-11 DIAGNOSIS — G8929 Other chronic pain: Secondary | ICD-10-CM

## 2022-02-11 DIAGNOSIS — M533 Sacrococcygeal disorders, not elsewhere classified: Secondary | ICD-10-CM

## 2022-02-11 DIAGNOSIS — M545 Low back pain, unspecified: Secondary | ICD-10-CM

## 2022-02-11 NOTE — Progress Notes (Signed)
Patient: Kara [name]  Service Category: E/M  Provider: Gaspar Cola, MD  DOB: 21-Mar-1951  DOS: 02/11/2022  Location: Office  MRN: [id number]  Setting: Ambulatory outpatient  Referring Provider: Tracie Harrier, MD  Type: Established Patient  Specialty: Interventional Pain Management  PCP: Tracie Harrier, MD  Location: Remote location  Delivery: TeleHealth     Virtual Encounter - Pain Management PROVIDER NOTE: Information contained herein reflects review and annotations entered in association with encounter. Interpretation of such information and data should be left to medically-trained personnel. Information provided to patient can be located elsewhere in the medical record under "Patient Instructions". Document created using STT-dictation technology, any transcriptional errors that may result from process are unintentional.    Contact & Pharmacy Preferred: [phone] Home: [phone] (home) Mobile: [phone] (mobile) E-mail: [email or web address]  [place], [place] [place] [address] Phone: [phone] Fax: [phone]   Pre-screening  Kara [name] offered "in-person" vs "virtual" encounter. She indicated preferring virtual for this encounter.   Reason COVID-19*  Social distancing based on CDC and AMA recommendations.   I contacted Kara [name] on 02/11/2022 via telephone.      I clearly identified myself as Gaspar Cola, MD. I verified that I was speaking with the correct person using two identifiers (Name: Kara [name], and date of birth: 04-03-1951).  Consent I sought verbal advanced consent from Kara [name] for virtual visit interactions. I informed Kara [name] of possible security and privacy concerns, risks, and limitations associated with providing "not-in-person" medical evaluation and management services. I also informed Kara [name] of the availability of "in-person"  appointments. Finally, I informed her that there would be a charge for the virtual visit and that she could be  personally, fully or partially, financially responsible for it. Kara [name] expressed understanding and agreed to proceed.   Historic Elements   Kara [name] is a 71 y.o. year old, female patient evaluated today after our last contact on 01/28/2022. 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 She also  has a past surgical history that includes Knee surgery; Carpal tunnel release (Bilateral); Colon resection sigmoid (N/A, 11/02/2016); Colostomy (N/A, 11/02/2016); Sigmoidoscopy (N/A, 11/13/2016); PULMONARY VENOGRAPHY (N/A, 11/19/2016); Colonoscopy with propofol (N/A, 02/03/2017); Colon surgery (11/02/2016); IVC FILTER INSERTION (Right, 10/2016); Colostomy reversal (N/A, 02/24/2017); Appendectomy (N/A, 02/24/2017); Lysis of adhesion (N/A, 02/24/2017); laparoscopy (N/A, 02/24/2017); Ileo loop diversion (N/A, 02/24/2017); Sigmoidoscopy (N/A, 05/19/2017); Ileostomy closure (N/A, 06/01/2017); IVC FILTER REMOVAL (N/A, 08/09/2017); Sacroplasty (N/A, 11/08/2017); Cataract extraction w/PHACO (Left, 11/21/2018); Cataract extraction w/PHACO (Right, 12/12/2018); and Video bronchoscopy with endobronchial navigation (N/A, 02/23/2021). Kara [name] has a current medication list which includes the following prescription(s): alendronate, candesartan, cholecalciferol, vitamin b-12, cyclobenzaprine, dulaglutide, gabapentin, gabapentin, glipizide, hydrocodone-acetaminophen, meclizine, multivitamin with minerals, ropinirole, tramadol, warfarin, and calcium carbonate. She  reports that she has never smoked. She has never used smokeless tobacco. She  reports that she does not drink alcohol and does not use drugs. Kara [name] is allergic to adhesive [tape], other, latex, and morphine and related.   HPI  Today, she is being contacted for a post-procedure assessment.  Post-procedure evaluation   Type:  Intra-articular hip & peri-articular bursae injection   R2L1    Laterality: Bilateral (-50)  Level:  Lower pelvic and hip joint level.  Imaging: Fluoroscopy-guided         Anesthesia: Local anesthesia (1-2% Lidocaine) Anxiolysis: None                 Sedation: No Sedation                       DOS: 01/28/2022  Performed by: Gaspar Cola, MD  Purpose: Diagnostic/Therapeutic Indications: Hip pain severe enough to impact quality of life or function. Rationale (medical necessity): procedure needed and proper for the diagnosis and/or treatment of Kara [name] medical symptoms and needs. 1. Chronic hip pain (Bilateral) (R>L)   2. Osteoarthritis of hips (Bilateral)   3. Greater trochanteric bursitis (Left)   4. Other bursitis of hip, not elsewhere classified (gluteus medius bursa) (Left)    Chronic anticoagulation (COUMADIN)    Latex precautions, history of latex allergy    Severe obesity with body mass index (BMI) of 35.0 to 39.9 with comorbidity (HCC)    NAS-11 Pain score:   Pre-procedure: 9 /10   Post-procedure: 2 /10   Note: The patient requested to come in for a Lumbar facet block however, I was skeptical about that since she had a left lumbar facet RFA done on 07/21/2021 with the right side done on 07/07/2021.  Upon arrival to the clinic I performed a physical exam on the patient and he was exquisitely positive for bilateral hip joint arthralgia with bilateral decreased range of motion and pain on movement which was significant on the left side compared to the right.  The patient indicates the pain to be primarily in the posterior aspect of the hip joint with pain to palpation on the lateral aspect of the left leg, over the  trochanteric bursa.  Bilateral provocative Saralyn Pilar maneuver was negative for sacroiliac joint pain but positive for bilateral hip joint arthralgia.  Based on the findings, the decision was made to change the planned procedure to a bilateral hip joint injection.  Interestingly, on 09/15/2021 we had to do the right side due to a similar problem.  With that particular procedure the patient indicated having attained 100% relief of the pain for the duration of the local anesthetic followed by a decrease to a 65% improvement that lasted only for 2 days.  X-rays done on 11/19/2021 show degenerative changes of the right hip with moderate right hip osteoarthritis.   Effectiveness:  Initial hour after procedure: 100 %. Subsequent 4-6 hours post-procedure: 100 %. Analgesia past initial 6 hours: 85 %. Ongoing improvement:  Analgesic: The patient indicates having attained 100% relief of the pain for the duration of the local anesthetic followed by a decrease to an 85% that seems to be ongoing.  This confirms that the source of her pain was that of the sacroiliac joint rather than the facet joints since we had recently done radiofrequency ablation.  An alternative here would be to consider the possibility of doing radiofrequency ablation of the SI joints as well. Function: Kara [name] reports improvement in function ROM: Kara [name] reports improvement in ROM  Pharmacotherapy Assessment   Opioid Analgesic: Oxycodone IR 10 mg, 1 tab PO qd,  from PCP. No chronic opioid analgesics therapy prescribed by our practice, 2ry to Medication Agreement Violation. (Getting oxycodone from PCP in multiple occasions, according to PMP)(11/21/20; 12/26/20; 01/16/21; 02/13/21). Opioid analgesic pharmacotherapy D/C'ed on 03/03/2021. MME: 20 mg/day.   Monitoring: [place] PMP: PDMP reviewed during this encounter.  Pharmacotherapy: No side-effects or adverse reactions reported. Compliance: No problems identified. Effectiveness: Clinically  acceptable. Plan: Refer to "POC". UDS:  Summary  Date Value Ref Range Status  11/12/2020 Note  Final    Comment:    ==================================================================== [employer] Select 13 (MW) ==================================================================== Test                             Result       Flag       Units  Drug Present and Declared for Prescription Verification   Norhydrocodone                 61           EXPECTED   ng/mg creat    Norhydrocodone is an expected metabolite of hydrocodone.    Tramadol                       >4202        EXPECTED   ng/mg creat   O-Desmethyltramadol            >4202        EXPECTED   ng/mg creat   N-Desmethyltramadol            1712         EXPECTED   ng/mg creat    Source of tramadol is a prescription medication. O-desmethyltramadol    and N-desmethyltramadol are expected metabolites of tramadol.  Drug Absent but Declared for Prescription Verification   Hydrocodone                    Not Detected UNEXPECTED ng/mg creat    Hydrocodone is almost always present in patients taking this drug    consistently. Absence of hydrocodone could be due to lapse of time    since the last dose or unusual pharmacokinetics (rapid metabolism).  ==================================================================== Test                      Result    Flag   Units      Ref Range   Creatinine              119              mg/dL      >=20 ==================================================================== Declared Medications:  The flagging and interpretation on this report are based on the  following declared medications.  Unexpected results may arise from  inaccuracies in the declared medications.   **Note: The testing scope of this panel includes these medications:   Hydrocodone  Tramadol   **Note: The testing scope of this panel does not include the  following reported medications:   Acetaminophen  Alendronate (Fosamax)   Aspirin  Calcium  Candesartan  Cholecalciferol  Cyanocobalamin  Cyclobenzaprine  Dulaglutide  Gabapentin  Glipizide  Meclizine  Multivitamin  Nystatin  Ondansetron  Ropinirole  Tolterodine  Warfarin ==================================================================== For clinical consultation, please call [phone]. ====================================================================    No results found for: "CBDTHCR", "D8THCCBX", "D9THCCBX"   Laboratory Chemistry Profile   Renal Lab Results  Component Value Date   BUN 10 11/22/2021   CREATININE 0.56 11/22/2021   GFRAA >60 07/09/2019   GFRNONAA >60 11/22/2021    Hepatic Lab Results  Component Value Date   AST 25 11/20/2021   ALT 31 11/20/2021   ALBUMIN 3.4 (L) 11/20/2021   ALKPHOS 38 11/20/2021   LIPASE 22  11/20/2021    Electrolytes Lab Results  Component Value Date   NA 142 11/22/2021   K 3.7 11/22/2021   CL 112 (H) 11/22/2021   CALCIUM 7.7 (L) 11/22/2021   MG 1.7 08/14/2020   PHOS 3.3 11/07/2016    Bone Lab Results  Component Value Date   25OHVITD1 25 (L) 11/02/2017   25OHVITD2 <1.0 11/02/2017   25OHVITD3 25 11/02/2017    Inflammation (CRP: Acute Phase) (ESR: Chronic Phase) Lab Results  Component Value Date   CRP 1.7 (H) 08/15/2020   ESRSEDRATE 12 11/02/2017   LATICACIDVEN 1.7 11/21/2021         Note: Above Lab results reviewed.  Imaging  DG PAIN CLINIC C-ARM 1-60 MIN NO REPORT Fluoro was used, but no Radiologist interpretation will be provided.  Please refer to "NOTES" tab for provider progress note.  Assessment  The primary encounter diagnosis was Chronic sacroiliac joint pain (Left). Diagnoses of Chronic sacroiliac joint pain (Right) and Chronic low back pain (1ry area of Pain) (Bilateral) w/o sciatica were also pertinent to this visit.  Plan of Care  Problem-specific:  No problem-specific Assessment & Plan notes found for this encounter.  Kara [name] has a  current medication list which includes the following long-term medication(s): candesartan, glipizide, ropinirole, warfarin, and calcium carbonate.  Pharmacotherapy (Medications Ordered): No orders of the defined types were placed in this encounter.  Orders:  No orders of the defined types were placed in this encounter.  Follow-up plan:   Return if symptoms worsen or fail to improve.     Interventional Therapies  Risk  Complexity Considerations:   Estimated body mass index is 37.45 kg/m as calculated from the following:   Height as of 05/05/21: _0  (1.6 m).   Weight as of 05/13/21: 211 lb 6.4 oz (95.9 kg). 1.  Coumadin anticoagulation  (Stop: 5 days  Re-start: 2hrs) 2.  Latex allergy  3.  Morbid obesity (class III) (BMI>40)  4.  No Lumbar RFA due to morbid obesity    Planned  Pending:      Under consideration:   Consider bilateral hip joint MRI    Completed:   Diagnostic/therapeutic right IA hip inj. x2 (01/28/2022)  Diagnostic/therapeutic left IA hip inj. x1 (01/28/2022)  Diagnostic/therapeutic right trochanteric bursa inj. x1 (09/15/2021) (85/85/85)  Diagnostic/therapeutic left trochanteric bursa inj. x1 (01/28/2022)  Therapeutic left lumbar facet RFA x1 (07/21/2021) (100/100/100 x1 week/80)  Therapeutic right lumbar facet RFA x1 (07/07/2021) (100/100/80/80)  Palliative/Therapeutic left L4-5 LESI x1 (11/10/2017) (100/80/0/0-25)  Diagnostic/therapeutic right L4 TFESI x1 (11/19/2021) (90/80/0/0) (fell at [employer]) Palliative left lumbar facet MBB x17 (05/05/2021) (100/100/75/85)  Palliative right lumbar facet MBB x23 (05/05/2021) (100/100/75/85)  Palliative right SI joint block x9 (01/28/2022) (100/100/80/80)  Diagnostic left SI joint block x2 (01/28/2022) (100/100/80/80)  Palliative/Therapeutic (Midline) caudal ESI x3 (06/08/2018) (100/100/100 x 2-3 days/0)    Therapeutic  Palliative (PRN) options:   NO PRN procedures without F2F exam by Dr. Dossie Arbour    Recent Visits Date  Type Provider Dept  01/28/22 Procedure visit Milinda Pointer, MD [name]  12/10/21 Office Visit Milinda Pointer, MD [name]  11/19/21 Procedure visit Milinda Pointer, MD [name]  Showing recent visits within past 90 days and meeting all other requirements Today's Visits Date Type Provider Dept  02/11/22 Office Visit Milinda Pointer, MD [name]  Showing today's visits and meeting all other requirements Future Appointments No visits were found meeting these conditions. Showing future appointments within  next 90 days and meeting all other requirements  I discussed the assessment and treatment plan with the patient. The patient was provided an opportunity to ask questions and all were answered. The patient agreed with the plan and demonstrated an understanding of the instructions.  Patient advised to call back or seek an in-person evaluation if the symptoms or condition worsens.  Duration of encounter: 15 minutes.  Note by: Gaspar Cola, MD Date: 02/11/2022; Time: 3:57 PM

## 2022-02-16 ENCOUNTER — Encounter: Payer: Self-pay | Admitting: Oncology

## 2022-02-16 ENCOUNTER — Inpatient Hospital Stay: Payer: Medicaid Other | Attending: Oncology | Admitting: Oncology

## 2022-02-16 VITALS — BP 116/64 | HR 89 | Temp 97.9°F | Resp 18 | Wt 208.0 lb

## 2022-02-16 DIAGNOSIS — C3411 Malignant neoplasm of upper lobe, right bronchus or lung: Secondary | ICD-10-CM

## 2022-02-16 DIAGNOSIS — R918 Other nonspecific abnormal finding of lung field: Secondary | ICD-10-CM | POA: Insufficient documentation

## 2022-02-16 DIAGNOSIS — Z8616 Personal history of COVID-19: Secondary | ICD-10-CM | POA: Insufficient documentation

## 2022-02-16 DIAGNOSIS — Z923 Personal history of irradiation: Secondary | ICD-10-CM | POA: Insufficient documentation

## 2022-02-16 NOTE — Progress Notes (Signed)
Hematology/Oncology Consult note [facility]  [phone] [phone]  Patient Care Team: Tracie Harrier, MD as PCP - General (Internal Medicine) Telford Nab, RN as Oncology Nurse Navigator Sindy Guadeloupe, MD as Consulting Physician (Oncology)   Name of the patient: Kara [name]  [id number]  01-Mar-1951   Date of visit: 02/16/22  Diagnosis-right upper lobe lung cancer s/p SBRT  Chief complaint/ Reason for visit-routine follow-up of lung cancer  Heme/Onc history: Patient is a 71 year old female underwent CT chest with contrast in May 2022 to follow-up on lung nodule that was incidentally noted on CT abdomen in March 2022 when she was admitted for [place].  At that time she was noted to have a right lower lobe 1.3 x 1.1 cm right lower lobe nodule.  CT chest in May 2022 showed right upper lobe nodule measuring 1.4 cm which was also hypermetabolic on PET scan with an SUV of 3.3.  No other enlarged mediastinal or hilar lymph nodes or distant metastatic disease was noted.   Repeat CT scan in August 2022 showed mild increase in the size of the right upper lobe lung nodule to 1.8 cm.  No evidence of locoregional adenopathy.    Interval history-patient reports doing well for her age.  Denies any worsening shortness of breath.  Denies any recent hospitalizations.  ECOG PS- 1 Pain scale- 0   Review of systems- Review of Systems  Constitutional:  Positive for malaise/fatigue. Negative for chills, fever and weight loss.  HENT:  Negative for congestion, ear discharge and nosebleeds.   Eyes:  Negative for blurred vision.  Respiratory:  Negative for cough, hemoptysis, sputum production, shortness of breath and wheezing.   Cardiovascular:  Negative for chest pain, palpitations, orthopnea and claudication.  Gastrointestinal:  Negative for abdominal pain, blood in stool, constipation, diarrhea, heartburn, melena, nausea and vomiting.  Genitourinary:   Negative for dysuria, flank pain, frequency, hematuria and urgency.  Musculoskeletal:  Negative for back pain, joint pain and myalgias.  Skin:  Negative for rash.  Neurological:  Negative for dizziness, tingling, focal weakness, seizures, weakness and headaches.  Endo/Heme/Allergies:  Does not bruise/bleed easily.  Psychiatric/Behavioral:  Negative for depression and suicidal ideas. The patient does not have insomnia.       Allergies  Allergen Reactions   Adhesive [Tape] Other (See Comments)    Pt reports, when removing tape from skin most tapes pull her skin off. 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, 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   CHF (congestive heart failure) ([place])    followed colon resection, "wouldn't let me get up"   Diabetes ([place])    type 2   Diverticulitis 2018   History of being hospitalized    1 month ago for 3 days, vomiting, and kink in upper intestine   History of hiatal hernia    Hypertension    Osteoporosis    PE (pulmonary thromboembolism) ([place])    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[place])    Vertigo      Past Surgical History:  Procedure Laterality Date   APPENDECTOMY N/A 02/24/2017   Procedure:  Incidental  APPENDECTOMY;  Surgeon: [name], MD;  Location: [name];  Service: General;  Laterality: N/A;   CARPAL TUNNEL RELEASE Bilateral    CATARACT EXTRACTION W/PHACO Left 11/21/2018   Procedure: CATARACT EXTRACTION PHACO AND INTRAOCULAR LENS PLACEMENT ([place]) LEFT DIABETES;  Surgeon: Birder Robson, MD;  Location: [facility];  Service: Ophthalmology;  Laterality: Left;  latex sensitivity Diabetic - oral meds   CATARACT EXTRACTION W/PHACO Right 12/12/2018    Procedure: CATARACT EXTRACTION PHACO AND INTRAOCULAR LENS PLACEMENT ([place]) RIGHT DIABETES;  Surgeon: Birder Robson, MD;  Location: [facility];  Service: Ophthalmology;  Laterality: Right;  Diabetes-oral med Latex sensitiviy   COLON RESECTION SIGMOID N/A 11/02/2016   Procedure: COLON RESECTION SIGMOID;  Surgeon: [name], MD;  Location: [name];  Service: General;  Laterality: N/A;   COLON SURGERY  11/02/2016   COLONOSCOPY WITH PROPOFOL N/A 02/03/2017   Procedure: COLONOSCOPY WITH PROPOFOL;  Surgeon: Lin Landsman, MD;  Location: [facility];  Service: Gastroenterology;  Laterality: N/A;   COLOSTOMY N/A 11/02/2016   Procedure: COLOSTOMY;  Surgeon: [name], MD;  Location: [name];  Service: General;  Laterality: N/A;   COLOSTOMY REVERSAL N/A 02/24/2017   Procedure: COLOSTOMY REVERSAL, ostomy takedown, spleenic flexure mobilization, excision rectal stump/distal sigmoid, anastomosis with suture reinforcement;  Surgeon: [name], MD;  Location: [name];  Service: General;  Laterality: N/A;   ILEO LOOP DIVERSION N/A 02/24/2017   Procedure: ILEO LOOP COLOSTOMY;  Surgeon: [name], MD;  Location: [name];  Service: General;  Laterality: N/A;   ILEOSTOMY CLOSURE N/A 06/01/2017   Procedure: LOOP ILEOSTOMY TAKEDOWN;  Surgeon: [name], MD;  Location: [name];  Service: General;  Laterality: N/A;   IVC FILTER INSERTION Right 10/2016   IVC FILTER REMOVAL N/A 08/09/2017   Procedure: IVC FILTER REMOVAL;  Surgeon: Katha Cabal, MD;  Location: [place] [facility];  Service: Cardiovascular;  Laterality: N/A;   KNEE SURGERY     torn menicus   LAPAROSCOPY N/A 02/24/2017   Procedure: LAPAROSCOPY DIAGNOSTIC;  Surgeon: [name], MD;  Location: [name];  Service: General;  Laterality: N/A;   LYSIS OF ADHESION N/A 02/24/2017   Procedure: LYSIS OF ADHESION;  Surgeon: [name], MD;  Location: [name];  Service: General;  Laterality: N/A;    PULMONARY VENOGRAPHY N/A 11/19/2016   Procedure: Pulmonary Venography; IVC filter placement; possible pulmonary thrombectomy;  Surgeon: Katha Cabal, MD;  Location: [place] [facility];  Service: Cardiovascular;  Laterality: N/A;   SACROPLASTY N/A 11/08/2017   Procedure: SACROPLASTY S2;  Surgeon: Hessie Knows, MD;  Location: [name];  Service: Orthopedics;  Laterality: N/A;   SIGMOIDOSCOPY N/A 11/13/2016   Procedure: endoscopic  flexible SIGMOIDOSCOPY;  Surgeon: Florene Glen, MD;  Location: [name];  Service: General;  Laterality: N/A;   SIGMOIDOSCOPY N/A 05/19/2017   Procedure: Beryle Quant;  Surgeon: [name], MD;  Location: [name];  Service: General;  Laterality: N/A;   VIDEO BRONCHOSCOPY WITH ENDOBRONCHIAL NAVIGATION N/A 02/23/2021   Procedure: ROBOTIC VIDEO BRONCHOSCOPY WITH ENDOBRONCHIAL NAVIGATION;  Surgeon: Tyler Pita, MD;  Location: [name];  Service: Pulmonary;  Laterality: N/A;    Social History   Socioeconomic History   Marital status: Married    Spouse name: Not on file   Number of children: Not on file   Years of education: Not on file   Highest education level: Not on file  Occupational History   Not on file  Tobacco Use   Smoking  status: Never   Smokeless tobacco: Never  Vaping Use   Vaping Use: Never used  Substance and Sexual Activity   Alcohol use: No    Alcohol/week: 0.0 standard drinks of alcohol   Drug use: No   Sexual activity: Never  Other Topics Concern   Not on file  Social History Narrative   Not on file   Social Determinants of Health   Financial Resource Strain: Not on file  Food Insecurity: Not on file  Transportation Needs: Not on file  Physical Activity: Not on file  Stress: Not on file  Social Connections: Not on file  Intimate Partner Violence: Not on file    Family History  Problem Relation Age of Onset   Diabetes Mother    Heart disease Mother    Cancer Mother    Breast cancer Mother         >50   Heart disease Father    Diabetes Sister    Heart disease Sister      Current Outpatient Medications:    alendronate (FOSAMAX) 70 MG tablet, Take 70 mg by mouth every Friday. Take with a full glass of water on an empty stomach., Disp: , Rfl:    calcium carbonate (CALCIUM 600) 600 MG TABS tablet, Take 1 tablet (600 mg total) by mouth 2 (two) times daily with a meal., Disp: 60 tablet, Rfl: 5   candesartan (ATACAND) 16 MG tablet, Take 16 mg by mouth in the morning., Disp: , Rfl:    cholecalciferol (VITAMIN D) 25 MCG (1000 UNIT) tablet, Take 1,000 Units by mouth in the morning., Disp: , Rfl:    Cyanocobalamin (VITAMIN B-12) 5000 MCG TBDP, Take 5,000 mcg by mouth in the morning., Disp: , Rfl:    Dulaglutide 1.5 MG/0.5ML SOPN, Inject 1.5 mg into the skin every Saturday., Disp: , Rfl:    gabapentin (NEURONTIN) 100 MG capsule, Take 200 mg by mouth with breakfast, with lunch, and with evening meal., Disp: , Rfl:    gabapentin (NEURONTIN) 300 MG capsule, Take 300 mg by mouth at bedtime., Disp: , Rfl:    glipiZIDE (GLUCOTROL) 10 MG tablet, Take 10 mg by mouth 2 (two) times daily before a meal., Disp: , Rfl:    HYDROcodone-acetaminophen (NORCO/VICODIN) 5-325 MG tablet, Take 1 tablet by mouth every 6 (six) hours as needed for moderate pain., Disp: , Rfl:    meclizine (ANTIVERT) 25 MG tablet, Take 25 mg by mouth 3 (three) times daily as needed for dizziness., Disp: , Rfl:    Multiple Vitamin (MULTIVITAMIN WITH MINERALS) TABS tablet, Take 1 tablet by mouth in the morning., Disp: , Rfl:    rOPINIRole (REQUIP) 0.5 MG tablet, Take 0.5 mg by mouth at bedtime., Disp: , Rfl:    warfarin (COUMADIN) 6 MG tablet, Take 6 mg by mouth See admin instructions. Take 1 tablet (6 mg) by mouth on Mondays, Tuesdays, Wednesdays, & Thursdays Take 1 tablet (6 mg) with 1 tablet (1 mg) by mouth on Fridays, Saturdays & Sundays (7 mg), Disp: , Rfl:    cyclobenzaprine (FLEXERIL) 10 MG tablet, Take 10 mg by mouth at  bedtime., Disp: , Rfl:    traMADol (ULTRAM) 50 MG tablet, Take 50 mg by mouth at bedtime as needed for moderate pain or severe pain. (Patient not taking: Reported on 02/16/2022), Disp: , Rfl:   Physical exam:  Vitals:   02/16/22 1003  BP: 116/64  Pulse: 89  Resp: 18  Temp: 97.9 F (36.6 C)  SpO2: 95%  Weight: 208 lb (94.3 kg)   Physical Exam Constitutional:      General: She is not in acute distress. Cardiovascular:     Rate and Rhythm: Normal rate and regular rhythm.     Heart sounds: Normal heart sounds.  Pulmonary:     Effort: Pulmonary effort is normal.     Breath sounds: Normal breath sounds.  Abdominal:     General: Bowel sounds are normal.     Palpations: Abdomen is soft.  Skin:    General: Skin is warm and dry.  Neurological:     Mental Status: She is alert and oriented to person, place, and time.         Latest Ref Rng & Units 11/22/2021    8:23 AM  CMP  Glucose 70 - 99 mg/dL 157   BUN 8 - 23 mg/dL 10   Creatinine 0.44 - 1.00 mg/dL 0.56   Sodium 135 - 145 mmol/L 142   Potassium 3.5 - 5.1 mmol/L 3.7   Chloride 98 - 111 mmol/L 112   CO2 22 - 32 mmol/L 23   Calcium 8.9 - 10.3 mg/dL 7.7       Latest Ref Rng & Units 11/22/2021    8:23 AM  CBC  WBC 4.0 - 10.5 K/uL 7.9   Hemoglobin 12.0 - 15.0 g/dL 12.7   Hematocrit 36.0 - 46.0 % 40.0   Platelets 150 - 400 K/uL 153     No images are attached to the encounter.  DG PAIN CLINIC C-ARM 1-60 MIN NO REPORT  Result Date: 01/28/2022 Fluoro was used, but no Radiologist interpretation will be provided. Please refer to "NOTES" tab for provider progress note.    Assessment and plan- Patient is a 71 y.o. female with history of rightUpper lobe lung cancer s/p SBRT here for routine follow-up  I have reviewed CT chest images independently and discussed findings with the patient which shows overall stable appearance of right upper lobe and superior segment of right lower lobe consolidation changes compatible with prior  radiation.  She has 2 other subcentimeter nodules which are stable.  There was a new 3 mm nodule noted in the right lower lobe and I will plan to get a repeat CT chest in 4 months time and see her thereafter   Visit Diagnosis 1. Cancer of upper lobe of right lung [facility])      Dr. Randa Evens, MD, MPH [facility] [phone] 02/16/2022 3:39 PM

## 2022-03-03 DIAGNOSIS — R791 Abnormal coagulation profile: Secondary | ICD-10-CM | POA: Diagnosis not present

## 2022-03-09 ENCOUNTER — Telehealth: Payer: Self-pay | Admitting: Pain Medicine

## 2022-03-09 NOTE — Telephone Encounter (Signed)
Patient is asking for injection in her back. I will schedule eval for Dr Dossie Arbour to consider.

## 2022-03-09 NOTE — Telephone Encounter (Signed)
PT wants to see about getting facet block done.

## 2022-03-12 DIAGNOSIS — Z6836 Body mass index (BMI) 36.0-36.9, adult: Secondary | ICD-10-CM | POA: Diagnosis not present

## 2022-03-12 DIAGNOSIS — R29898 Other symptoms and signs involving the musculoskeletal system: Secondary | ICD-10-CM | POA: Diagnosis not present

## 2022-03-12 DIAGNOSIS — R262 Difficulty in walking, not elsewhere classified: Secondary | ICD-10-CM | POA: Diagnosis not present

## 2022-03-12 DIAGNOSIS — G8929 Other chronic pain: Secondary | ICD-10-CM | POA: Diagnosis not present

## 2022-03-12 DIAGNOSIS — Z0000 Encounter for general adult medical examination without abnormal findings: Secondary | ICD-10-CM | POA: Diagnosis not present

## 2022-03-12 DIAGNOSIS — Z9181 History of falling: Secondary | ICD-10-CM | POA: Diagnosis not present

## 2022-03-12 DIAGNOSIS — M2550 Pain in unspecified joint: Secondary | ICD-10-CM | POA: Diagnosis not present

## 2022-03-12 DIAGNOSIS — R7309 Other abnormal glucose: Secondary | ICD-10-CM | POA: Diagnosis not present

## 2022-03-12 DIAGNOSIS — M47817 Spondylosis without myelopathy or radiculopathy, lumbosacral region: Secondary | ICD-10-CM | POA: Diagnosis not present

## 2022-03-12 DIAGNOSIS — R8290 Unspecified abnormal findings in urine: Secondary | ICD-10-CM | POA: Diagnosis not present

## 2022-03-12 DIAGNOSIS — M25511 Pain in right shoulder: Secondary | ICD-10-CM | POA: Diagnosis not present

## 2022-03-12 DIAGNOSIS — R791 Abnormal coagulation profile: Secondary | ICD-10-CM | POA: Diagnosis not present

## 2022-03-12 DIAGNOSIS — I10 Essential (primary) hypertension: Secondary | ICD-10-CM | POA: Diagnosis not present

## 2022-03-17 DIAGNOSIS — Z7901 Long term (current) use of anticoagulants: Secondary | ICD-10-CM | POA: Diagnosis not present

## 2022-03-17 DIAGNOSIS — G8929 Other chronic pain: Secondary | ICD-10-CM | POA: Diagnosis not present

## 2022-03-17 DIAGNOSIS — I10 Essential (primary) hypertension: Secondary | ICD-10-CM | POA: Diagnosis not present

## 2022-03-17 DIAGNOSIS — R682 Dry mouth, unspecified: Secondary | ICD-10-CM | POA: Diagnosis not present

## 2022-03-17 DIAGNOSIS — Z23 Encounter for immunization: Secondary | ICD-10-CM | POA: Diagnosis not present

## 2022-03-17 DIAGNOSIS — E1165 Type 2 diabetes mellitus with hyperglycemia: Secondary | ICD-10-CM | POA: Diagnosis not present

## 2022-03-17 DIAGNOSIS — Z6836 Body mass index (BMI) 36.0-36.9, adult: Secondary | ICD-10-CM | POA: Diagnosis not present

## 2022-03-17 DIAGNOSIS — M545 Low back pain, unspecified: Secondary | ICD-10-CM | POA: Diagnosis not present

## 2022-03-17 NOTE — Progress Notes (Unsigned)
PROVIDER NOTE: Information contained herein reflects review and annotations entered in association with encounter. Interpretation of such information and data should be left to medically-trained personnel. Information provided to patient can be located elsewhere in the medical record under "Patient Instructions". Document created using STT-dictation technology, any transcriptional errors that may result from process are unintentional.    Patient: Kara [name]  Service Category: E/M  Provider: Gaspar Cola, MD  DOB: 29-Mar-1951  DOS: 03/18/2022  Referring Provider: Tracie Harrier, MD  MRN: [id number]  Specialty: Interventional Pain Management  PCP: Tracie Harrier, MD  Type: Established Patient  Setting: Ambulatory outpatient    Location: Office  Delivery: Face-to-face     HPI  [name], a 71 y.o. year old female, is here today because of her No primary diagnosis found.. [name] primary complain today is No chief complaint on file. Last encounter: My last encounter with her was on 03/09/2022. Pertinent problems: 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); Lumbar pars defect (L3 and L4) (Bilateral); Diabetic peripheral neuropathy ([place]); Chronic lower extremity pain (2ry area of Pain) (Bilateral); Chronic radicular pain of lower extremity; Osteoarthritis of hip (Right); Chronic low back pain (1ry area of Pain) (Bilateral) w/o sciatica; Spondylosis  without myelopathy or radiculopathy, lumbosacral region; Other specified dorsopathies, sacral and sacrococcygeal region; Secondary osteoarthritis of multiple sites; Sacral insufficiency fracture; DDD (degenerative disc disease), lumbosacral; Coccygodynia; Chronic sacroiliac joint pain (Left); Osteoarthritis of lumbar spine; Spondylolisthesis, lumbosacral region; Abnormal MRI, lumbar spine (08/09/2019); Spasm of muscle of lower back; Chronic hip pain (Bilateral) (R>L); Osteoarthritis of hips (Bilateral); Greater trochanteric bursitis (Right); Chronic hip pain (Right); Chronic low back pain (Right) w/o sciatica; Greater trochanteric bursitis (Left); and Other bursitis of hip, not elsewhere classified (gluteus medius bursa) (Left) on their pertinent problem list. Pain Assessment: Severity of   is reported as a  /10. Location:    / . Onset:  . Quality:  . Timing:  . Modifying factor(s):  Marland Kitchen Vitals:  vitals were not taken for this visit.   Reason for encounter: evaluation of worsening, or previously known (established) problem. ***  Pharmacotherapy Assessment  Analgesic: Oxycodone IR 10 mg, 1 tab PO qd,  from PCP. No chronic opioid analgesics therapy prescribed by our practice, 2ry to Medication Agreement Violation. (Getting oxycodone from PCP in multiple occasions, according to PMP)(11/21/20; 12/26/20; 01/16/21; 02/13/21). Opioid analgesic pharmacotherapy D/C'ed on 03/03/2021. MME: 20 mg/day.   Monitoring: [place] PMP: PDMP reviewed during this encounter.       Pharmacotherapy: No side-effects or adverse reactions reported. Compliance: No problems identified. Effectiveness: Clinically acceptable.  No notes on file  No results found for: "CBDTHCR" No results found for: "D8THCCBX" No results found for: "D9THCCBX"  UDS:  Summary  Date Value Ref Range Status  11/12/2020 Note  Final    Comment:    ==================================================================== [employer] [facility] [employer]  (MW) ==================================================================== Test                             Result       Flag  Units  Drug Present and Declared for Prescription Verification   Norhydrocodone                 61           EXPECTED   ng/mg creat    Norhydrocodone is an expected metabolite of hydrocodone.    Tramadol                       >4202        EXPECTED   ng/mg creat   O-Desmethyltramadol            >4202        EXPECTED   ng/mg creat   N-Desmethyltramadol            1712         EXPECTED   ng/mg creat    Source of tramadol is a prescription medication. O-desmethyltramadol    and N-desmethyltramadol are expected metabolites of tramadol.  Drug Absent but Declared for Prescription Verification   Hydrocodone                    Not Detected UNEXPECTED ng/mg creat    Hydrocodone is almost always present in patients taking this drug    consistently. Absence of hydrocodone could be due to lapse of time    since the last dose or unusual pharmacokinetics (rapid metabolism).  ==================================================================== Test                      Result    Flag   Units      Ref Range   Creatinine              119              mg/dL      >=20 ==================================================================== Declared Medications:  The flagging and interpretation on this report are based on the  following declared medications.  Unexpected results may arise from  inaccuracies in the declared medications.   **Note: The testing scope of this panel includes these medications:   Hydrocodone  Tramadol   **Note: The testing scope of this panel does not include the  following reported medications:   Acetaminophen  Alendronate (Fosamax)  Aspirin  Calcium  Candesartan  Cholecalciferol  Cyanocobalamin  Cyclobenzaprine  Dulaglutide  Gabapentin  Glipizide  Meclizine  Multivitamin  Nystatin  Ondansetron  Ropinirole  Tolterodine   Warfarin ==================================================================== For clinical consultation, please call [phone]. ====================================================================       ROS  Constitutional: Denies any fever or chills Gastrointestinal: No reported hemesis, hematochezia, vomiting, or acute GI distress Musculoskeletal: Denies any acute onset joint swelling, redness, loss of ROM, or weakness Neurological: No reported episodes of acute onset apraxia, aphasia, dysarthria, agnosia, amnesia, paralysis, loss of coordination, or loss of consciousness  Medication Review  Dulaglutide, HYDROcodone-acetaminophen, Vitamin B-12, alendronate, calcium carbonate, candesartan, cholecalciferol, cyclobenzaprine, gabapentin, glipiZIDE, meclizine, multivitamin with minerals, rOPINIRole, traMADol, and warfarin  History Review  Allergy: [name] is allergic to adhesive [tape], other, latex, and morphine and related. Drug: [name]  reports no history of drug use. Alcohol:  reports no history of alcohol use. Tobacco:  reports that she has never smoked. She has never used smokeless tobacco. Social: [name]  reports that she has never smoked. She has never used smokeless tobacco. She reports that she does not drink alcohol and does not use drugs. 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 Surgical: [name]  has a past surgical history that includes Knee surgery; Carpal tunnel release (Bilateral); Colon resection sigmoid (N/A, 11/02/2016); Colostomy (N/A, 11/02/2016); Sigmoidoscopy (N/A, 11/13/2016); PULMONARY VENOGRAPHY (N/A, 11/19/2016); Colonoscopy with propofol (N/A, 02/03/2017); Colon  surgery (11/02/2016); IVC FILTER INSERTION (Right, 10/2016); Colostomy reversal (N/A, 02/24/2017); Appendectomy (N/A, 02/24/2017); Lysis of adhesion (N/A, 02/24/2017); laparoscopy (N/A, 02/24/2017); Ileo loop diversion (N/A, 02/24/2017); Sigmoidoscopy (N/A, 05/19/2017); Ileostomy closure (N/A, 06/01/2017); IVC FILTER REMOVAL (N/A, 08/09/2017); Sacroplasty (N/A, 11/08/2017); Cataract extraction w/PHACO (Left, 11/21/2018); Cataract extraction w/PHACO (Right, 12/12/2018); and Video bronchoscopy with endobronchial navigation (N/A, 02/23/2021). Family: family history includes Breast cancer in her mother; Cancer in her mother; Diabetes in her mother and sister; Heart disease in her father, mother, and sister.  Laboratory Chemistry Profile   Renal Lab Results  Component Value Date   BUN 10 11/22/2021   CREATININE 0.56 11/22/2021   GFRAA >60 07/09/2019   GFRNONAA >60 11/22/2021    Hepatic Lab Results  Component Value Date   AST 25 11/20/2021   ALT 31 11/20/2021   ALBUMIN 3.4 (L) 11/20/2021   ALKPHOS 38 11/20/2021   LIPASE 22 11/20/2021    Electrolytes Lab Results  Component Value Date   NA 142 11/22/2021   K 3.7 11/22/2021   CL 112 (H) 11/22/2021   CALCIUM 7.7 (L) 11/22/2021   MG 1.7 08/14/2020   PHOS 3.3 11/07/2016    Bone Lab Results  Component Value Date   25OHVITD1 25 (L) 11/02/2017   25OHVITD2 <1.0 11/02/2017   25OHVITD3 25 11/02/2017    Inflammation (CRP: Acute Phase) (ESR: Chronic Phase) Lab Results  Component Value Date   CRP 1.7 (H) 08/15/2020   ESRSEDRATE 12 11/02/2017   LATICACIDVEN 1.7 11/21/2021         Note: Above Lab results reviewed.  Recent Imaging Review  DG PAIN CLINIC C-ARM 1-60 MIN NO REPORT Fluoro was used, but no Radiologist interpretation will be provided.  Please refer to "NOTES" tab for provider progress note. Note: Reviewed        Physical Exam  General appearance: Well nourished, well developed, and well hydrated. In no apparent acute distress Mental  status: Alert, oriented x 3 (person, place, & time)       Respiratory: No evidence of acute respiratory distress Eyes: PERLA Vitals: There were no vitals taken for this visit. BMI: Estimated body mass index is 36.85 kg/m as calculated from the following:   Height as of 01/28/22: '5\' 3"'  (1.6 m).   Weight as of 02/16/22: 208 lb (94.3 kg). Ideal: Patient weight not recorded  Assessment   Diagnosis Status  No diagnosis found. Controlled Controlled Controlled   Updated Problems: No problems updated.   Plan of Care  Problem-specific:  No problem-specific Assessment & Plan notes found for this encounter.  Ms. Shaniqwa Kara [name] has a current medication list which includes the following long-term medication(s): calcium carbonate, candesartan, glipizide, ropinirole, and warfarin.  Pharmacotherapy (Medications Ordered): No orders of the defined types were placed in this encounter.  Orders:  No orders of the defined types were placed in this encounter.  Follow-up plan:   No follow-ups on file.     Interventional Therapies  Risk  Complexity Considerations:   Estimated body mass index is 37.45 kg/m as calculated from the following:  Height as of 05/05/21: '5\' 3"'  (1.6 m).   Weight as of 05/13/21: 211 lb 6.4 oz (95.9 kg). 1.  Coumadin anticoagulation  (Stop: 5 days  Re-start: 2hrs) 2.  Latex allergy  3.  Morbid obesity (class III) (BMI>40)  4.  No Lumbar RFA due to morbid obesity    Planned  Pending:      Under consideration:   Consider bilateral hip joint MRI    Completed:   Diagnostic/therapeutic right IA hip inj. x2 (01/28/2022)  Diagnostic/therapeutic left IA hip inj. x1 (01/28/2022)  Diagnostic/therapeutic right trochanteric bursa inj. x1 (09/15/2021) (85/85/85)  Diagnostic/therapeutic left trochanteric bursa inj. x1 (01/28/2022)  Therapeutic left lumbar facet RFA x1 (07/21/2021) (100/100/100 x1 week/80)  Therapeutic right lumbar facet RFA x1 (07/07/2021) (100/100/80/80)   Palliative/Therapeutic left L4-5 LESI x1 (11/10/2017) (100/80/0/0-25)  Diagnostic/therapeutic right L4 TFESI x1 (11/19/2021) (90/80/0/0) (fell at [employer]) Palliative left lumbar facet MBB x17 (05/05/2021) (100/100/75/85)  Palliative right lumbar facet MBB x23 (05/05/2021) (100/100/75/85)  Palliative right SI joint block x9 (01/28/2022) (100/100/80/80)  Diagnostic left SI joint block x2 (01/28/2022) (100/100/80/80)  Palliative/Therapeutic (Midline) caudal ESI x3 (06/08/2018) (100/100/100 x 2-3 days/0)    Therapeutic  Palliative (PRN) options:   NO PRN procedures without F2F exam by Dr. Dossie Arbour     Recent Visits Date Type Provider Dept  02/11/22 Office Visit Milinda Pointer, MD [name]  01/28/22 Procedure visit Milinda Pointer, MD [name]  Showing recent visits within past 90 days and meeting all other requirements Future Appointments Date Type Provider Dept  03/18/22 Appointment Milinda Pointer, MD [name]  Showing future appointments within next 90 days and meeting all other requirements  I discussed the assessment and treatment plan with the patient. The patient was provided an opportunity to ask questions and all were answered. The patient agreed with the plan and demonstrated an understanding of the instructions.  Patient advised to call back or seek an in-person evaluation if the symptoms or condition worsens.  Duration of encounter: *** minutes.  Total time on encounter, as per AMA guidelines included both the face-to-face and non-face-to-face time personally spent by the physician and/or other qualified health care professional(s) on the day of the encounter (includes time in activities that require the physician or other qualified health care professional and does not include time in activities normally performed by clinical staff). Physician's time may include the following activities when performed: preparing to see the  patient (eg, review of tests, pre-charting review of records) obtaining and/or reviewing separately obtained history performing a medically appropriate examination and/or evaluation counseling and educating the patient/family/caregiver ordering medications, tests, or procedures referring and communicating with other health care professionals (when not separately reported) documenting clinical information in the electronic or other health record independently interpreting results (not separately reported) and communicating results to the patient/ family/caregiver care coordination (not separately reported)  Note by: Gaspar Cola, MD Date: 03/18/2022; Time: 12:12 PM

## 2022-03-18 ENCOUNTER — Encounter: Payer: Self-pay | Admitting: Pain Medicine

## 2022-03-18 ENCOUNTER — Ambulatory Visit: Payer: PPO | Attending: Pain Medicine | Admitting: Pain Medicine

## 2022-03-18 VITALS — BP 126/70 | HR 99 | Resp 18 | Ht 63.0 in | Wt 199.0 lb

## 2022-03-18 DIAGNOSIS — M25552 Pain in left hip: Secondary | ICD-10-CM | POA: Insufficient documentation

## 2022-03-18 DIAGNOSIS — Z7901 Long term (current) use of anticoagulants: Secondary | ICD-10-CM | POA: Insufficient documentation

## 2022-03-18 DIAGNOSIS — M4306 Spondylolysis, lumbar region: Secondary | ICD-10-CM | POA: Insufficient documentation

## 2022-03-18 DIAGNOSIS — M160 Bilateral primary osteoarthritis of hip: Secondary | ICD-10-CM | POA: Insufficient documentation

## 2022-03-18 DIAGNOSIS — M545 Low back pain, unspecified: Secondary | ICD-10-CM | POA: Diagnosis not present

## 2022-03-18 DIAGNOSIS — G8929 Other chronic pain: Secondary | ICD-10-CM | POA: Insufficient documentation

## 2022-03-18 DIAGNOSIS — M25551 Pain in right hip: Secondary | ICD-10-CM | POA: Insufficient documentation

## 2022-03-18 DIAGNOSIS — M47816 Spondylosis without myelopathy or radiculopathy, lumbar region: Secondary | ICD-10-CM | POA: Insufficient documentation

## 2022-03-18 DIAGNOSIS — Z91040 Latex allergy status: Secondary | ICD-10-CM | POA: Diagnosis not present

## 2022-03-18 NOTE — Patient Instructions (Addendum)
____________________________________________________________________________________________  Patient Information update  To: All of our patients.  Re: Name change.  It has been made official that our current name, "[facility]"   will soon be changed to "[facility]".   The purpose of this change is to eliminate any confusion created by the concept of our practice being a "Medication [facility] Pain [facility]". In the past this has led to the misconception that we treat pain primarily by the use of prescription medications.  Nothing can be farther from the truth.   Understanding PAIN MANAGEMENT: To further understand what our practice does, you first have to understand that "Pain Management" is a subspecialty that requires additional training once a physician has completed their specialty training, which can be in either Anesthesia, Neurology, Psychiatry, or Physical Medicine and Rehabilitation (PMR). Each one of these contributes to the final approach taken by each physician to the management of their patient's pain. To be a "Pain Management Specialist" you must have first completed one of the specialty trainings below.  Anesthesiologists - trained in clinical pharmacology and interventional techniques such as nerve blockade and regional as well as central neuroanatomy. They are trained to block pain before, during, and after surgical interventions.  Neurologists - trained in the diagnosis and pharmacological treatment of complex neurological conditions, such as Multiple Sclerosis, Parkinson's, spinal cord injuries, and other systemic conditions that may be associated with symptoms that may include but are not limited to pain. They tend to rely primarily on the treatment of chronic pain using prescription medications.  Psychiatrist - trained in conditions affecting the psychosocial wellbeing  of patients including but not limited to depression, anxiety, schizophrenia, personality disorders, addiction, and other substance use disorders that may be associated with chronic pain. They tend to rely primarily on the treatment of chronic pain using prescription medications.   Physical Medicine and Rehabilitation (PMR) physicians, also known as physiatrists - trained to treat a wide variety of medical conditions affecting the brain, spinal cord, nerves, bones, joints, ligaments, muscles, and tendons. Their training is primarily aimed at treating patients that have suffered injuries that have caused severe physical impairment. Their training is primarily aimed at the physical therapy and rehabilitation of those patients. They may also work alongside orthopedic surgeons or neurosurgeons using their expertise in assisting surgical patients to recover after their surgeries.  INTERVENTIONAL PAIN MANAGEMENT is sub-subspecialty of Pain Management.  Our physicians are Board-certified in Anesthesia, Pain Management, and Interventional Pain Management.  This meaning that not only have they been trained and Board-certified in their specialty of Anesthesia, and subspecialty of Pain Management, but they have also received further training in the sub-subspecialty of Interventional Pain Management, in order to become Board-certified as INTERVENTIONAL PAIN MANAGEMENT SPECIALIST.    Mission: Our goal is to use our skills in  [place] as alternatives to the chronic use of prescription opioid medications for the treatment of pain. To make this more clear, we have changed our name to reflect what we do and offer. We will continue to offer medication management assessment and recommendations, but we will not be taking over any patient's medication management.  ____________________________________________________________________________________________    ______________________________________________________________________________________________  Body mass index (BMI)  Body mass index (BMI) is a common tool for deciding whether a person has an appropriate body weight.  It measures a persons weight in relation to their height.   According to the [employer] of health (NIH):  A BMI of less than 18.5 means that a person is underweight. A BMI of between 18.5 and 24.9 is ideal. A BMI of between 25 and 29.9 is overweight. A BMI over 30 indicates obesity.  Weight Management Required  URGENT: Your weight has been found to be adversely affecting your health.  Dear Kara [name]:  Your current Estimated body mass index is 36.85 kg/m as calculated from the following:   Height as of 01/28/22: _0  (1.6 m).   Weight as of 02/16/22: 208 lb (94.3 kg).  Please use the table below to identify your weight category and associated incidence of chronic pain, secondary to your weight.  Body Mass Index (BMI) Classification BMI level (kg/m2) Category Associated incidence of chronic pain  <18  Underweight   18.5-24.9 Ideal body weight   25-29.9 Overweight  20%  30-34.9 Obese (Class I)  68%  35-39.9 Severe obesity (Class II)  136%  >40 Extreme obesity (Class III)  254%   In addition: You will be considered "Morbidly Obese", if your BMI is above 30 and you have one or more of the following conditions which are known to be caused and/or directly associated with obesity: 1.    Type 2 Diabetes (Which in turn can lead to cardiovascular diseases (CVD), stroke, peripheral vascular diseases (PVD), retinopathy, nephropathy, and neuropathy) 2.    Cardiovascular Disease (High Blood Pressure; Congestive Heart Failure; High Cholesterol; Coronary Artery Disease; Angina; or History of Heart Attacks) 3.    Breathing problems (Asthma; obesity-hypoventilation syndrome; obstructive sleep apnea; chronic inflammatory airway disease; reactive airway disease; or shortness  of breath) 4.    Chronic kidney disease 5.    Liver disease (nonalcoholic fatty liver disease) 6.    High blood pressure 7.    Acid reflux (gastroesophageal reflux disease; heartburn) 8.    Osteoarthritis (OA) (with any of the following: hip pain; knee pain; and/or low back pain) 9.    Low back pain (Lumbar Facet Syndrome; and/or Degenerative Disc Disease) 10.  Hip pain (Osteoarthritis of hip) (For every 1 lbs of added body weight, there is a 2 lbs increase in pressure inside of each hip articulation. 1:2 mechanical relationship) 11.  Knee pain (Osteoarthritis of knee) (For every 1 lbs of added body weight, there is a 4 lbs increase in pressure inside of each knee articulation. 1:4 mechanical relationship) (patients with a BMI>30 kg/m2 were 6.8 times more likely to develop knee OA than normal-weight individuals) 12.  Cancer: Epidemiological studies have shown that obesity is a risk factor for: post-menopausal breast cancer; cancers of the endometrium, colon and kidney cancer; malignant adenomas of the oesophagus. Obese subjects have an approximately 1.5-3.5-fold increased risk of developing these cancers compared with normal-weight subjects, and it has been estimated that between 15 and 45% of these cancers can be attributed to overweight. More recent studies suggest that obesity may also increase the risk of other types of cancer, including pancreatic, hepatic and gallbladder cancer. (Ref: Obesity and cancer. Pischon T, Nthlings U, [employer] H. Proc Nutr Soc. 2008 May;67(2):128-45. doi: 85.0277/A1287867672094709.) The International Agency for Research on Cancer (IARC) has identified 13 cancers associated with overweight and obesity: meningioma, multiple myeloma, adenocarcinoma of the esophagus, and cancers of the thyroid, postmenopausal breast cancer, gallbladder, stomach, liver, pancreas, kidney, ovaries, uterus, colon and rectal (colorectal) cancers. 53 percent of all cancers diagnosed in women and 24  percent of those diagnosed in men are associated with overweight and obesity.  Recommendation: At this point it is urgent  that you take a step back and concentrate in loosing weight. Dedicate 100% of your efforts on this task. Nothing else will improve your health more than bringing your weight down and your BMI to less than 30. If you are here, you probably have chronic pain. We know that most chronic pain patients have difficulty exercising secondary to their pain. For this reason, you must rely on proper nutrition and diet in order to lose the weight. If your BMI is above 40, you should seriously consider bariatric surgery. A realistic goal is to lose 10% of your body weight over a period of 12 months.  Be honest to yourself, if over time you have unsuccessfully tried to lose weight, then it is time for you to seek professional help and to enter a medically supervised weight management program, and/or undergo bariatric surgery. Stop procrastinating.   Pain management considerations:  1.    Pharmacological Problems: Be advised that the use of opioid analgesics (oxycodone; hydrocodone; morphine; methadone; codeine; and all of their derivatives) have been associated with decreased metabolism and weight gain.  For this reason, should we see that you are unable to lose weight while taking these medications, it may become necessary for [place] to taper down and indefinitely discontinue them.  2.    Technical Problems: The incidence of successful interventional therapies decreases as the patient's BMI increases. It is much more difficult to accomplish a safe and effective interventional therapy on a patient with a BMI above 35. 3.    Radiation Exposure Problems: The x-rays machine, used to accomplish injection therapies, will automatically increase their x-ray output in order to capture an appropriate bone image. This means that radiation exposure increases exponentially with the patient's BMI. (The higher the BMI,  the higher the radiation exposure.) Although the level of radiation used at a given time is still safe to the patient, it is not for the physician and/or assisting staff. Unfortunately, radiation exposure is accumulative. Because physicians and the staff have to do procedures and be exposed on a daily basis, this can result in health problems such as cancer and radiation burns. Radiation exposure to the staff is monitored by the radiation batches that they wear. The exposure levels are reported back to the staff on a quarterly basis. Depending on levels of exposure, physicians and staff may be obligated by law to decrease this exposure. This means that they have the right and obligation to refuse providing therapies where they may be overexposed to radiation. For this reason, physicians may decline to offer therapies such as radiofrequency ablation or implants to patients with a BMI above 40. 4.    Current Trends: Be advised that the current trend is to no longer offer certain therapies to patients with a BMI equal to, or above 35, due to increase perioperative risks, increased technical procedural difficulties, and excessive radiation exposure to healthcare personnel.  _____________________________________________________________________________________________   ____________________________________________________________________________________________  General Risks and Possible Complications  Patient Responsibilities: It is important that you read this as it is part of your informed consent. It is our duty to inform you of the risks and possible complications associated with treatments offered to you. It is your responsibility as a patient to read this and to ask questions about anything that is not clear or that you believe was not covered in this document.  Patient's Rights: You have the right to refuse treatment. You also have the right to change your mind, even after initially having agreed to  have the treatment done. However, under this last option, if you wait until the last second to change your mind, you may be charged for the materials used up to that point.  Introduction: Medicine is not an Chief Strategy Officer. Everything in Medicine, including the lack of treatment(s), carries the potential for danger, harm, or loss (which is by definition: Risk). In Medicine, a complication is a secondary problem, condition, or disease that can aggravate an already existing one. All treatments carry the risk of possible complications. The fact that a side effects or complications occurs, does not imply that the treatment was conducted incorrectly. It must be clearly understood that these can happen even when everything is done following the highest safety standards.  No treatment: You can choose not to proceed with the proposed treatment alternative. The "PRO(s)" would include: avoiding the risk of complications associated with the therapy. The "CON(s)" would include: not getting any of the treatment benefits. These benefits fall under one of three categories: diagnostic; therapeutic; and/or palliative. Diagnostic benefits include: getting information which can ultimately lead to improvement of the disease or symptom(s). Therapeutic benefits are those associated with the successful treatment of the disease. Finally, palliative benefits are those related to the decrease of the primary symptoms, without necessarily curing the condition (example: decreasing the pain from a flare-up of a chronic condition, such as incurable terminal cancer).  General Risks and Complications: These are associated to most interventional treatments. They can occur alone, or in combination. They fall under one of the following six (6) categories: no benefit or worsening of symptoms; bleeding; infection; nerve damage; allergic reactions; and/or death. No benefits or worsening of symptoms: In Medicine there are no guarantees, only  probabilities. No healthcare provider can ever guarantee that a medical treatment will work, they can only state the probability that it may. Furthermore, there is always the possibility that the condition may worsen, either directly, or indirectly, as a consequence of the treatment. Bleeding: This is more common if the patient is taking a blood thinner, either prescription or over the counter (example: Goody Powders, Fish oil, Aspirin, Garlic, etc.), or if suffering a condition associated with impaired coagulation (example: Hemophilia, cirrhosis of the liver, low platelet counts, etc.). However, even if you do not have one on these, it can still happen. If you have any of these conditions, or take one of these drugs, make sure to notify your treating physician. Infection: This is more common in patients with a compromised immune system, either due to disease (example: diabetes, cancer, human immunodeficiency virus [HIV], etc.), or due to medications or treatments (example: therapies used to treat cancer and rheumatological diseases). However, even if you do not have one on these, it can still happen. If you have any of these conditions, or take one of these drugs, make sure to notify your treating physician. Nerve Damage: This is more common when the treatment is an invasive one, but it can also happen with the use of medications, such as those used in the treatment of cancer. The damage can occur to small secondary nerves, or to large primary ones, such as those in the spinal cord and brain. This damage may be temporary or permanent and it may lead to impairments that can range from temporary numbness to permanent paralysis and/or brain death. Allergic Reactions: Any time a substance or material comes in contact with our body, there is the possibility of an allergic reaction. These can range from a mild skin rash (contact dermatitis)  to a severe systemic reaction (anaphylactic reaction), which can result in  death. Death: In general, any medical intervention can result in death, most of the time due to an unforeseen complication. ____________________________________________________________________________________________  ______________________________________________________________________  Preparing for your procedure  During your procedure appointment there will be: No Prescription Refills. No disability issues to discussed. No medication changes or discussions.  Instructions: Food intake: Avoid eating anything solid for at least 8 hours prior to your procedure. Clear liquid intake: You may take clear liquids such as water up to 2 hours prior to your procedure. (No carbonated drinks. No soda.) Transportation: Unless otherwise stated by your physician, bring a driver. Morning Medicines: Except for blood thinners, take all of your other morning medications with a sip of water. Make sure to take your heart and blood pressure medicines. If your blood pressure's lower number is above 100, the case will be rescheduled. Blood thinners: If you take a blood thinner, but were not instructed to stop it, call our office [phone] and ask to talk to a nurse. Not stopping a blood thinner prior to certain procedures could lead to serious complications. Diabetics on insulin: Notify the staff so that you can be scheduled 1st case in the morning. If your diabetes requires high dose insulin, take only  of your normal insulin dose the morning of the procedure and notify the staff that you have done so. Preventing infections: Shower with an antibacterial soap the morning of your procedure.  Build-up your immune system: Take 1000 mg of Vitamin C with every meal (3 times a day) the day prior to your procedure. Antibiotics: Inform the nursing staff if you are taking any antibiotics or if you have any conditions that may require antibiotics prior to procedures. (Example: recent joint implants)   Pregnancy: If you  are pregnant make sure to notify the nursing staff. Not doing so may result in injury to the fetus, including death.  Sickness: If you have a cold, fever, or any active infections, call and cancel or reschedule your procedure. Receiving steroids while having an infection may result in complications. Arrival: You must be in the facility at least 30 minutes prior to your scheduled procedure. Tardiness: Your scheduled time is also the cutoff time. If you do not arrive at least 15 minutes prior to your procedure, you will be rescheduled.  Children: Do not bring any children with you. Make arrangements to keep them home. Dress appropriately: There is always a possibility that your clothing may get soiled. Avoid long dresses. Valuables: Do not bring any jewelry or valuables.  Reasons to call and reschedule or cancel your procedure: (Following these recommendations will minimize the risk of a serious complication.) Surgeries: Avoid having procedures within 2 weeks of any surgery. (Avoid for 2 weeks before or after any surgery). Flu Shots: Avoid having procedures within 2 weeks of a flu shots or . (Avoid for 2 weeks before or after immunizations). Barium: Avoid having a procedure within 7-10 days after having had a radiological study involving the use of radiological contrast. (Myelograms, Barium swallow or enema study). Heart attacks: Avoid any elective procedures or surgeries for the initial 6 months after a "Myocardial Infarction" (Heart Attack). Blood thinners: It is imperative that you stop these medications before procedures. Let us know if you if you take any blood thinner.  Infection: Avoid procedures during or within two weeks of an infection (including chest colds or gastrointestinal problems). Symptoms associated with infections include: Localized redness, fever, chills, night  sweats or profuse sweating, burning sensation when voiding, cough, congestion, stuffiness, runny nose, sore throat,  diarrhea, nausea, vomiting, cold or Flu symptoms, recent or current infections. It is specially important if the infection is over the area that we intend to treat. Heart and lung problems: Symptoms that may suggest an active cardiopulmonary problem include: cough, chest pain, breathing difficulties or shortness of breath, dizziness, ankle swelling, uncontrolled high or unusually low blood pressure, and/or palpitations. If you are experiencing any of these symptoms, cancel your procedure and contact your primary care physician for an evaluation.  Remember:  Regular Business hours are:  Monday to Thursday 8:00 AM to 4:00 PM  Provider's Schedule: Milinda Pointer, MD:  Procedure days: Tuesday and Thursday 7:30 AM to 4:00 PM  Gillis Santa, MD:  Procedure days: Monday and Wednesday 7:30 AM to 4:00 PM  ______________________________________________________________________  ____________________________________________________________________________________________  Blood Thinners  IMPORTANT NOTICE:  If you take any of these, make sure to notify the nursing staff.  Failure to do so may result in injury.  Recommended time intervals to stop and restart blood-thinners, before & after invasive procedures  Generic Name Brand Name Pre-procedure. Stop this long before procedure. Post-procedure. Minimum waiting period before restarting.  Abciximab Reopro 15 days 2 hrs  Alteplase Activase 10 days 10 days  Anagrelide Agrylin    Apixaban Eliquis 3 days 6 hrs  Cilostazol Pletal 3 days 5 hrs  Clopidogrel Plavix 7-10 days 2 hrs  Dabigatran Pradaxa 5 days 6 hrs  Dalteparin Fragmin 24 hours 4 hrs  Dipyridamole Aggrenox 11days 2 hrs  Edoxaban Lixiana; Savaysa 3 days 2 hrs  Enoxaparin  Lovenox 24 hours 4 hrs  Eptifibatide Integrillin 8 hours 2 hrs  Fondaparinux  Arixtra 72 hours 12 hrs  Hydroxychloroquine Plaquenil 11 days   Prasugrel Effient 7-10 days 6 hrs  Reteplase Retavase 10 days 10 days   Rivaroxaban Xarelto 3 days 6 hrs  Ticagrelor Brilinta 5-7 days 6 hrs  Ticlopidine Ticlid 10-14 days 2 hrs  Tinzaparin Innohep 24 hours 4 hrs  Tirofiban Aggrastat 8 hours 2 hrs  Warfarin Coumadin 5 days 2 hrs   Other medications with blood-thinning effects  Product indications Generic (Brand) names Note  Cholesterol Lipitor Stop 4 days before procedure  Blood thinner (injectable) Heparin (LMW or LMWH Heparin) Stop 24 hours before procedure  Cancer Ibrutinib (Imbruvica) Stop 7 days before procedure  Malaria/Rheumatoid Hydroxychloroquine (Plaquenil) Stop 11 days before procedure  Thrombolytics  10 days before or after procedures   Over-the-counter (OTC) Products with blood-thinning effects  Product Common names Stop Time  Aspirin > 325 mg Goody Powders, Excedrin, etc. 11 days  Aspirin ? 81 mg  7 days  Fish oil  4 days  Garlic supplements  7 days  Ginkgo biloba  36 hours  Ginseng  24 hours  NSAIDs Ibuprofen, Naprosyn, etc. 3 days  Vitamin E  4 days   ____________________________________________________________________________________________

## 2022-03-18 NOTE — Progress Notes (Signed)
Safety precautions to be maintained throughout the outpatient stay will include: orient to surroundings, keep bed in low position, maintain call bell within reach at all times, provide assistance with transfer out of bed and ambulation.  

## 2022-04-01 DIAGNOSIS — R791 Abnormal coagulation profile: Secondary | ICD-10-CM | POA: Diagnosis not present

## 2022-04-06 ENCOUNTER — Ambulatory Visit
Admission: RE | Admit: 2022-04-06 | Discharge: 2022-04-06 | Disposition: A | Discharge status: Home / Self Care | Payer: Medicaid Other | Source: Ambulatory Visit | Attending: Pain Medicine | Admitting: Pain Medicine

## 2022-04-06 ENCOUNTER — Encounter: Payer: Self-pay | Admitting: Pain Medicine

## 2022-04-06 ENCOUNTER — Ambulatory Visit: Payer: Medicaid Other | Attending: Pain Medicine | Admitting: Pain Medicine

## 2022-04-06 VITALS — BP 141/68 | HR 79 | Temp 97.3°F | Resp 20 | Ht 63.0 in | Wt 199.0 lb

## 2022-04-06 DIAGNOSIS — M47817 Spondylosis without myelopathy or radiculopathy, lumbosacral region: Secondary | ICD-10-CM

## 2022-04-06 DIAGNOSIS — Z91040 Latex allergy status: Secondary | ICD-10-CM

## 2022-04-06 DIAGNOSIS — M545 Low back pain, unspecified: Secondary | ICD-10-CM | POA: Insufficient documentation

## 2022-04-06 DIAGNOSIS — M4317 Spondylolisthesis, lumbosacral region: Secondary | ICD-10-CM

## 2022-04-06 DIAGNOSIS — Z7901 Long term (current) use of anticoagulants: Secondary | ICD-10-CM

## 2022-04-06 DIAGNOSIS — M4306 Spondylolysis, lumbar region: Secondary | ICD-10-CM

## 2022-04-06 DIAGNOSIS — M4310 Spondylolisthesis, site unspecified: Secondary | ICD-10-CM

## 2022-04-06 DIAGNOSIS — M47816 Spondylosis without myelopathy or radiculopathy, lumbar region: Secondary | ICD-10-CM

## 2022-04-06 DIAGNOSIS — G8929 Other chronic pain: Secondary | ICD-10-CM

## 2022-04-06 MED ORDER — LACTATED RINGERS IV SOLN: Freq: Once | INTRAVENOUS | Status: AC

## 2022-04-06 MED ORDER — MIDAZOLAM HCL 2 MG/2ML IJ SOLN
INTRAMUSCULAR | Status: AC
  Filled 2022-04-06: qty 2

## 2022-04-06 MED ORDER — KETOROLAC TROMETHAMINE 60 MG/2ML IM SOLN
60.0000 mg | Freq: Once | INTRAMUSCULAR | Status: AC
  Administered 2022-04-06: 60 mg via INTRAMUSCULAR

## 2022-04-06 MED ORDER — MIDAZOLAM HCL 2 MG/2ML IJ SOLN
0.5000 mg | Freq: Once | INTRAMUSCULAR | Status: AC
  Administered 2022-04-06: 2 mg via INTRAVENOUS

## 2022-04-06 MED ORDER — ROPIVACAINE HCL 2 MG/ML IJ SOLN
INTRAMUSCULAR | Status: AC
  Filled 2022-04-06: qty 20

## 2022-04-06 MED ORDER — ROPIVACAINE HCL 2 MG/ML IJ SOLN
18.0000 mL | Freq: Once | INTRAMUSCULAR | Status: AC
  Administered 2022-04-06: 18 mL via PERINEURAL

## 2022-04-06 MED ORDER — PENTAFLUOROPROP-TETRAFLUOROETH EX AERO: INHALATION_SPRAY | Freq: Once | CUTANEOUS | Status: DC

## 2022-04-06 MED ORDER — KETOROLAC TROMETHAMINE 60 MG/2ML IM SOLN
INTRAMUSCULAR | Status: AC
  Filled 2022-04-06: qty 2

## 2022-04-06 MED ORDER — TRIAMCINOLONE ACETONIDE 40 MG/ML IJ SUSP
INTRAMUSCULAR | Status: AC
  Filled 2022-04-06: qty 2

## 2022-04-06 MED ORDER — LIDOCAINE HCL 2 % IJ SOLN
INTRAMUSCULAR | Status: AC
  Filled 2022-04-06: qty 20

## 2022-04-06 MED ORDER — TRIAMCINOLONE ACETONIDE 40 MG/ML IJ SUSP
80.0000 mg | Freq: Once | INTRAMUSCULAR | Status: AC
  Administered 2022-04-06: 80 mg

## 2022-04-06 MED ORDER — LIDOCAINE HCL 2 % IJ SOLN
20.0000 mL | Freq: Once | INTRAMUSCULAR | Status: AC
  Administered 2022-04-06: 400 mg

## 2022-04-06 NOTE — Progress Notes (Signed)
PROVIDER NOTE: Interpretation of information contained herein should be left to medically-trained personnel. Specific patient instructions are provided elsewhere under "Patient Instructions" section of medical record. This document was created in part using STT-dictation technology, any transcriptional errors that may result from this process are unintentional.  Patient: Kara [name] Type: Established DOB: [birth year] MRN: [id number] PCP: Tracie Harrier, MD  Service: Procedure DOS: 04/06/2022 Setting: Ambulatory Location: Ambulatory outpatient facility Delivery: Face-to-face Provider: Gaspar Cola, MD Specialty: Interventional Pain Management Specialty designation: 09 Location: Outpatient facility Ref. Prov.: Tracie Harrier, MD    Procedure:           Type: Lumbar Facet, Medial Branch Block(s) #1 of 2023 Laterality: Bilateral  Level: L3, L4, L5, & S1 Medial Branch Level(s). Injecting these levels blocks the L4-5 and L5-S1 lumbar facet joints. Imaging: Fluoroscopic guidance         Anesthesia: Local anesthesia (1-2% Lidocaine) Anxiolysis: IV Versed         Sedation: No Sedation                       DOS: 04/06/2022 Performed by: Gaspar Cola, MD  Primary Purpose: Diagnostic/Therapeutic Indications: Low back pain severe enough to impact quality of life or function. 1. Lumbar facet syndrome (Bilateral)   2. Spondylosis without myelopathy or radiculopathy, lumbosacral region   3. Lumbar facet arthropathy (L3-4, L4-5, and L5-S1) (Bilateral)   4. 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 Osteoarthritis of lumbar spine   7. Spondylolisthesis, lumbosacral region   8. Chronic low back pain (1ry area of Pain) (Bilateral) w/o sciatica   9. Severe obesity with body mass index (BMI) of 35.0 to 39.9 with comorbidity ([place])   10. Chronic anticoagulation (COUMADIN)   11. Latex precautions, history of latex allergy     NAS-11 Pain score:   Pre-procedure: 10-Worst pain ever/10   Post-procedure: 8  (after injection, MD in to talk with patient)/10  Note: The patient requested an IM Toradol injection approximately 20 minutes after the procedure when she should have had the full effect of the local anesthetic.  This would suggest that the pain that she he is currently experiencing is not coming from the lumbar facet joints.  She does have multilevel grade 1-2 Anterolisthesis of L4-5 and L5-S1.  According to the patient's last MRI on 08/09/2019 this anterolisthesis has progressed at both levels.  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    Position / Prep / Materials:  Position: Prone  Prep solution: DuraPrep (Iodine Povacrylex [0.7% available iodine] and Isopropyl Alcohol, 74% w/w) Area Prepped: Posterolateral Lumbosacral Spine (Wide prep: From the lower border of the scapula down to the end of the tailbone and from flank to flank.)  Materials:  Tray: Block Needle(s):  Type: Spinal  Gauge (G): 22  Length: 3.5-in Qty: 4     Pre-op H&P Assessment:  Kara [name] is a 71 y.o. (year old), female patient, seen today for interventional treatment. She  has a past surgical history that includes Knee surgery; Carpal tunnel release (Bilateral); Colon resection sigmoid (N/A, 11/02/2016); Colostomy (N/A, 11/02/2016); Sigmoidoscopy (N/A, 11/13/2016); PULMONARY VENOGRAPHY (N/A, 11/19/2016); Colonoscopy with propofol (N/A, 02/03/2017); Colon surgery (11/02/2016); IVC FILTER INSERTION (Right, 10/2016); Colostomy reversal (N/A, 02/24/2017); Appendectomy (N/A, 02/24/2017); Lysis of adhesion (N/A, 02/24/2017); laparoscopy (N/A, 02/24/2017); Ileo  loop diversion (N/A, 02/24/2017); Sigmoidoscopy (N/A, 05/19/2017); Ileostomy closure (N/A, 06/01/2017); IVC FILTER REMOVAL (N/A, 08/09/2017); Sacroplasty (N/A, 11/08/2017); Cataract extraction w/PHACO (Left,  11/21/2018); Cataract extraction w/PHACO (Right, 12/12/2018); and Video bronchoscopy with endobronchial navigation (N/A, 02/23/2021). Kara [name] has a current medication list which includes the following prescription(s): alendronate, candesartan, cholecalciferol, vitamin b-12, cyclobenzaprine, dulaglutide, gabapentin, gabapentin, glipizide, hydrocodone-acetaminophen, meclizine, multivitamin with minerals, oxybutynin, ropinirole, warfarin, and calcium carbonate, and the following Facility-Administered Medications: lactated ringers and pentafluoroprop-tetrafluoroeth. Her primarily concern today is the Back Pain (lower)  Initial Vital Signs:  Pulse/HCG Rate: 79ECG Heart Rate: 84 Temp: (!) 97.3 F (36.3 C) Resp: 18 BP: 136/83 SpO2: 97 %  BMI: Estimated body mass index is 35.25 kg/m as calculated from the following:   Height as of this encounter: 5\' 3"  (1.6 m).   Weight as of this encounter: 199 lb (90.3 kg).  Risk Assessment: Allergies: Reviewed. She is allergic to adhesive [tape], other, latex, and morphine and related.  Allergy Precautions: None required Coagulopathies: Reviewed. None identified.  Blood-thinner therapy: None at this time Active Infection(s): Reviewed. None identified. Kara [name] is afebrile  Site Confirmation: Kara [name] was asked to confirm the procedure and laterality before marking the site Procedure checklist: Completed Consent: Before the procedure and under the influence of no sedative(s), amnesic(s), or anxiolytics, the patient was informed of the treatment options, risks and possible complications. To fulfill our ethical and legal obligations, as recommended by the American Medical Association's Code of Ethics, I have informed the patient of my clinical impression; the nature and purpose of the treatment or procedure; the risks, benefits, and possible complications of the intervention; the alternatives, including doing nothing; the risk(s) and benefit(s) of the  alternative treatment(s) or procedure(s); and the risk(s) and benefit(s) of doing nothing. The patient was provided information about the general risks and possible complications associated with the procedure. These may include, but are not limited to: failure to achieve desired goals, infection, bleeding, organ or nerve damage, allergic reactions, paralysis, and death. In addition, the patient was informed of those risks and complications associated to Spine-related procedures, such as failure to decrease pain; infection (i.e.: Meningitis, epidural or intraspinal abscess); bleeding (i.e.: epidural hematoma, subarachnoid hemorrhage, or any other type of intraspinal or peri-dural bleeding); organ or nerve damage (i.e.: Any type of peripheral nerve, nerve root, or spinal cord injury) with subsequent damage to sensory, motor, and/or autonomic systems, resulting in permanent pain, numbness, and/or weakness of one or several areas of the body; allergic reactions; (i.e.: anaphylactic reaction); and/or death. Furthermore, the patient was informed of those risks and complications associated with the medications. These include, but are not limited to: allergic reactions (i.e.: anaphylactic or anaphylactoid reaction(s)); adrenal axis suppression; blood sugar elevation that in diabetics may result in ketoacidosis or comma; water retention that in patients with history of congestive heart failure may result in shortness of breath, pulmonary edema, and decompensation with resultant heart failure; weight gain; swelling or edema; medication-induced neural toxicity; particulate matter embolism and blood vessel occlusion with resultant organ, and/or nervous system infarction; and/or aseptic necrosis of one or more joints. Finally, the patient was informed that Medicine is not an exact science; therefore, there is also the possibility of unforeseen or unpredictable risks and/or possible complications that may result in a  catastrophic outcome. The patient indicated having understood very clearly. We have given the patient no guarantees and we have made no promises. Enough time was given to the patient to ask questions, all of which  were answered to the patient's satisfaction. Kara [name] has indicated that she wanted to continue with the procedure. Attestation: I, the ordering provider, attest that I have discussed with the patient the benefits, risks, side-effects, alternatives, likelihood of achieving goals, and potential problems during recovery for the procedure that I have provided informed consent. Date  Time: 04/06/2022 11:03 AM  Pre-Procedure Preparation:  Monitoring: As per clinic protocol. Respiration, ETCO2, SpO2, BP, heart rate and rhythm monitor placed and checked for adequate function Safety Precautions: Patient was assessed for positional comfort and pressure points before starting the procedure. Time-out: I initiated and conducted the "Time-out" before starting the procedure, as per protocol. The patient was asked to participate by confirming the accuracy of the "Time Out" information. Verification of the correct person, site, and procedure were performed and confirmed by me, the nursing staff, and the patient. "Time-out" conducted as per Joint Commission's Universal Protocol (UP.01.01.01). Time: 1135  Description of Procedure:          Laterality: Bilateral. The procedure was performed in identical fashion on both sides. Targeted Levels:  L3, L4, L5, & S1 Medial Branch Level(s)  Safety Precautions: Aspiration looking for blood return was conducted prior to all injections. At no point did we inject any substances, as a needle was being advanced. Before injecting, the patient was told to immediately notify me if she was experiencing any new onset of "ringing in the ears, or metallic taste in the mouth". No attempts were made at seeking any paresthesias. Safe injection practices and needle disposal  techniques used. Medications properly checked for expiration dates. SDV (single dose vial) medications used. After the completion of the procedure, all disposable equipment used was discarded in the proper designated medical waste containers. Local Anesthesia: Protocol guidelines were followed. The patient was positioned over the fluoroscopy table. The area was prepped in the usual manner. The time-out was completed. The target area was identified using fluoroscopy. A 12-in long, straight, sterile hemostat was used with fluoroscopic guidance to locate the targets for each level blocked. Once located, the skin was marked with an approved surgical skin marker. Once all sites were marked, the skin (epidermis, dermis, and hypodermis), as well as deeper tissues (fat, connective tissue and muscle) were infiltrated with a small amount of a short-acting local anesthetic, loaded on a 10cc syringe with a 25G, 1.5-in  Needle. An appropriate amount of time was allowed for local anesthetics to take effect before proceeding to the next step. Local Anesthetic: Lidocaine 2.0% The unused portion of the local anesthetic was discarded in the proper designated containers. Technical description of process:    L3 Medial Branch Nerve Block (MBB): The target area for the L3 medial branch is at the junction of the postero-lateral aspect of the superior articular process and the superior, posterior, and medial edge of the transverse process of L4. Under fluoroscopic guidance, a Quincke needle was inserted until contact was made with os over the superior postero-lateral aspect of the pedicular shadow (target area). After negative aspiration for blood, 0.5 mL of the nerve block solution was injected without difficulty or complication. The needle was removed intact. L4 Medial Branch Nerve Block (MBB): The target area for the L4 medial branch is at the junction of the postero-lateral aspect of the superior articular process and the  superior, posterior, and medial edge of the transverse process of L5. Under fluoroscopic guidance, a Quincke needle was inserted until contact was made with os over the superior postero-lateral aspect of the  pedicular shadow (target area). After negative aspiration for blood, 0.5 mL of the nerve block solution was injected without difficulty or complication. The needle was removed intact. L5 Medial Branch Nerve Block (MBB): The target area for the L5 medial branch is at the junction of the postero-lateral aspect of the superior articular process and the superior, posterior, and medial edge of the sacral ala. Under fluoroscopic guidance, a Quincke needle was inserted until contact was made with os over the superior postero-lateral aspect of the pedicular shadow (target area). After negative aspiration for blood, 0.5 mL of the nerve block solution was injected without difficulty or complication. The needle was removed intact. S1 Medial Branch Nerve Block (MBB): The target area for the S1 medial branch is at the posterior and inferior 6 o'clock position of the L5-S1 facet joint. Under fluoroscopic guidance, the Quincke needle inserted for the L5 MBB was redirected until contact was made with os over the inferior and postero aspect of the sacrum, at the 6 o' clock position under the L5-S1 facet joint (Target area). After negative aspiration for blood, 0.5 mL of the nerve block solution was injected without difficulty or complication. The needle was removed intact.  Once the entire procedure was completed, the treated area was cleaned, making sure to leave some of the prepping solution back to take advantage of its long term bactericidal properties.         Illustration of the posterior view of the lumbar spine and the posterior neural structures. Laminae of L2 through S1 are labeled. DPRL5, dorsal primary ramus of L5; DPRS1, dorsal primary ramus of S1; DPR3, dorsal primary ramus of L3; FJ, facet  (zygapophyseal) joint L3-L4; I, inferior articular process of L4; LB1, lateral branch of dorsal primary ramus of L1; IAB, inferior articular branches from L3 medial branch (supplies L4-L5 facet joint); IBP, intermediate branch plexus; MB3, medial branch of dorsal primary ramus of L3; NR3, third lumbar nerve root; S, superior articular process of L5; SAB, superior articular branches from L4 (supplies L4-5 facet joint also); TP3, transverse process of L3.  Vitals:   04/06/22 1136 04/06/22 1138 04/06/22 1143 04/06/22 1150  BP: 139/66 134/74 120/66 (!) 141/68  Pulse:      Resp: (!) 25 20 (!) 30 20  Temp:      TempSrc:      SpO2: 97% 96% 96% 100%  Weight:      Height:         Start Time: 1135 hrs. End Time: 1143 hrs.  Imaging Guidance (Spinal):          Type of Imaging Technique: Fluoroscopy Guidance (Spinal) Indication(s): Assistance in needle guidance and placement for procedures requiring needle placement in or near specific anatomical locations not easily accessible without such assistance. Exposure Time: Please see nurses notes. Contrast: None used. Fluoroscopic Guidance: I was personally present during the use of fluoroscopy. "Tunnel Vision Technique" used to obtain the best possible view of the target area. Parallax error corrected before commencing the procedure. "Direction-depth-direction" technique used to introduce the needle under continuous pulsed fluoroscopy. Once target was reached, antero-posterior, oblique, and lateral fluoroscopic projection used confirm needle placement in all planes. Images permanently stored in EMR. Interpretation: No contrast injected. I personally interpreted the imaging intraoperatively. Adequate needle placement confirmed in multiple planes. Permanent images saved into the patient's record.  Antibiotic Prophylaxis:   Anti-infectives (From admission, onward)    None      Indication(s): None identified  Post-operative Assessment:  Post-procedure  Vital Signs:  Pulse/HCG Rate: 7988 Temp: (!) 97.3 F (36.3 C) Resp: 20 BP: (!) 141/68 SpO2: 100 %  EBL: None  Complications: No immediate post-treatment complications observed by team, or reported by patient.  Note: The patient tolerated the entire procedure well. A repeat set of vitals were taken after the procedure and the patient was kept under observation following institutional policy, for this type of procedure. Post-procedural neurological assessment was performed, showing return to baseline, prior to discharge. The patient was provided with post-procedure discharge instructions, including a section on how to identify potential problems. Should any problems arise concerning this procedure, the patient was given instructions to immediately contact us, at any time, without hesitation. In any case, we plan to contact the patient by telephone for a follow-up status report regarding this interventional procedure.  Comments:  No additional relevant information.  Plan of Care  Orders:  Orders Placed This Encounter  Procedures   LUMBAR FACET(MEDIAL BRANCH NERVE BLOCK) MBNB    Scheduling Instructions:     Procedure: Lumbar facet block (AKA.: Lumbosacral medial branch nerve block)     Side: Bilateral     Level: L3-4 & L5-S1 Facets (L2, L3, L4, L5, & S1 Medial Branch Nerves)     Sedation: Patient's choice.     Timeframe: Today    Order Specific Question:   Where will this procedure be performed?    Answer:   [name]   DG PAIN [facility] C-ARM 1-60 MIN NO REPORT    Intraoperative interpretation by procedural physician at [place].    Standing Status:   Standing    Number of Occurrences:   1    Order Specific Question:   Reason for exam:    Answer:   Assistance in needle guidance and placement for procedures requiring needle placement in or near specific anatomical locations not easily accessible without such assistance.   Informed Consent Details:  Physician/Practitioner Attestation; Transcribe to consent form and obtain patient signature    Nursing Order: Transcribe to consent form and obtain patient signature. Note: Always confirm laterality of pain with Kara [name], before procedure.    Order Specific Question:   Physician/Practitioner attestation of informed consent for procedure/surgical case    Answer:   I, the physician/practitioner, attest that I have discussed with the patient the benefits, risks, side effects, alternatives, likelihood of achieving goals and potential problems during recovery for the procedure that I have provided informed consent.    Order Specific Question:   Procedure    Answer:   Lumbar Facet Block  under fluoroscopic guidance    Order Specific Question:   Physician/Practitioner performing the procedure    Answer:   Johnie Stadel A. Dossie Arbour MD    Order Specific Question:   Indication/Reason    Answer:   Low Back Pain, with our without leg pain, due to Facet Joint Arthralgia (Joint Pain) Spondylosis (Arthritis of the Spine), without myelopathy or radiculopathy (Nerve Damage).   Care order/instruction: Please confirm that the patient has stopped the Coumadin (Warfarin) X 5 days prior to procedure or surgery.    Please confirm that the patient has stopped the Coumadin (Warfarin) X 5 days prior to procedure or surgery.    Standing Status:   Standing    Number of Occurrences:   1   Provide equipment / supplies at bedside    "Block Tray" (Disposable  single use) Needle type: SpinalSpinal Amount/quantity: 4 Size: Regular (3.5-inch) Gauge: 22G    Standing Status:  Standing    Number of Occurrences:   1    Order Specific Question:   Specify    Answer:   Block Tray   Bleeding precautions    Standing Status:   Standing    Number of Occurrences:   1   Latex precautions    Activate Latex-Free Protocol.    Standing Status:   Standing    Number of Occurrences:   1   Chronic Opioid Analgesic:  Oxycodone IR 10 mg, 1  tab PO qd,  from PCP. No chronic opioid analgesics therapy prescribed by our practice, 2ry to Medication Agreement Violation. (Getting oxycodone from PCP in multiple occasions, according to PMP)(11/21/20; 12/26/20; 01/16/21; 02/13/21). Opioid analgesic pharmacotherapy D/C'ed on 03/03/2021. MME: 20 mg/day.   Medications ordered for procedure: Meds ordered this encounter  Medications   lidocaine (XYLOCAINE) 2 % (with pres) injection 400 mg   pentafluoroprop-tetrafluoroeth (GEBAUERS) aerosol   lactated ringers infusion   midazolam (VERSED) injection 0.5-2 mg    Make sure Flumazenil is available in the pyxis when using this medication. If oversedation occurs, administer 0.2 mg IV over 15 sec. If after 45 sec no response, administer 0.2 mg again over 1 min; may repeat at 1 min intervals; not to exceed 4 doses (1 mg)   ropivacaine (PF) 2 mg/mL (0.2%) (NAROPIN) injection 18 mL   triamcinolone acetonide (KENALOG-40) injection 80 mg   ketorolac (TORADOL) injection 60 mg   Medications administered: We administered lidocaine, lactated ringers, midazolam, ropivacaine (PF) 2 mg/mL (0.2%), triamcinolone acetonide, and ketorolac.  See the medical record for exact dosing, route, and time of administration.  Follow-up plan:   Return in about 2 weeks (around 04/20/2022) for Proc-day (T,Th), (F2F), (PPE).       Interventional Therapies  Risk  Complexity Considerations:   Estimated body mass index is 37.45 kg/m as calculated from the following:   Height as of 05/05/21: 5\' 3"  (1.6 m).   Weight as of 05/13/21: 211 lb 6.4 oz (95.9 kg). 1.  Coumadin ANTICOAGULATION: (Stop: 5 days  Re-start: 2hrs) 2.  Latex ALLERGY  3.  Morbid obesity (class III) (BMI>40)  4.  No Lumbar RFA due to morbid obesity    Planned  Pending:   Palliative/therapeutic bilateral lumbar facet MBB (L2-S1) #1 (of 2023) w/ Dr. Gillis Santa    Under consideration:   Consider bilateral hip joint MRI    Completed:   Diagnostic/therapeutic  right IA hip inj. x2 (01/28/2022) (100/100/85/85)  Diagnostic/therapeutic left IA hip inj. x1 (01/28/2022) (100/100/85/85)  Diagnostic/therapeutic right trochanteric bursa inj. x1 (09/15/2021) (85/85/85)  Diagnostic/therapeutic left trochanteric bursa inj. x1 (01/28/2022) (100/100/85/85)  Therapeutic left lumbar facet RFA x1 (07/21/2021) (100/100/100 x1 week/80)  Therapeutic right lumbar facet RFA x1 (07/07/2021) (100/100/80/80)  Palliative/Therapeutic left L4-5 LESI x1 (11/10/2017) (100/80/0/0-25)  Diagnostic/therapeutic right L4 TFESI x1 (11/19/2021) (90/80/0/0) (fell at [employer]) Palliative left lumbar facet MBB x17 (05/05/2021) (100/100/75/85)  Palliative right lumbar facet MBB x23 (05/05/2021) (100/100/75/85)  Palliative right SI joint block x9 (01/28/2022) (100/100/80/80)  Diagnostic left SI joint block x2 (01/28/2022) (100/100/80/80)  Palliative/Therapeutic (Midline) caudal ESI x3 (06/08/2018) (100/100/100 x 2-3 days/0)    Therapeutic  Palliative (PRN) options:   NO PRN procedures without F2F exam by Dr. Dossie Arbour     Recent Visits Date Type Provider Dept  03/18/22 Office Visit Milinda Pointer, MD [name]  02/11/22 Office Visit Milinda Pointer, MD [name]  01/28/22 Procedure visit Milinda Pointer, MD [name]  Showing recent visits  within past 90 days and meeting all other requirements Today's Visits Date Type Provider Dept  04/06/22 Procedure visit Milinda Pointer, MD [name]  Showing today's visits and meeting all other requirements Future Appointments Date Type Provider Dept  04/22/22 Appointment Milinda Pointer, MD [name]  Showing future appointments within next 90 days and meeting all other requirements  Disposition: Discharge home  Discharge (Date  Time): 04/06/2022; 1205 hrs.   Primary Care Physician: Tracie Harrier, MD Location: [facility] Note by:  Gaspar Cola, MD Date: 04/06/2022; Time: 12:15 PM  Disclaimer:  Medicine is not an Chief Strategy Officer. The only guarantee in medicine is that nothing is guaranteed. It is important to note that the decision to proceed with this intervention was based on the information collected from the patient. The Data and conclusions were drawn from the patient's questionnaire, the interview, and the physical examination. Because the information was provided in large part by the patient, it cannot be guaranteed that it has not been purposely or unconsciously manipulated. Every effort has been made to obtain as much relevant data as possible for this evaluation. It is important to note that the conclusions that lead to this procedure are derived in large part from the available data. Always take into account that the treatment will also be dependent on availability of resources and existing treatment guidelines, considered by other Pain Management Practitioners as being common knowledge and practice, at the time of the intervention. For Medico-Legal purposes, it is also important to point out that variation in procedural techniques and pharmacological choices are the acceptable norm. The indications, contraindications, technique, and results of the above procedure should only be interpreted and judged by a Board-Certified Interventional Pain Specialist with extensive familiarity and expertise in the same exact procedure and technique.

## 2022-04-06 NOTE — Patient Instructions (Signed)
____________________________________________________________________________________________  Post-Procedure Discharge Instructions  Instructions: Apply ice:  Purpose: This will minimize any swelling and discomfort after procedure.  When: Day of procedure, as soon as you get home. How: Fill a plastic sandwich bag with crushed ice. Cover it with a small towel and apply to injection site. How long: (15 min on, 15 min off) Apply for 15 minutes then remove x 15 minutes.  Repeat sequence on day of procedure, until you go to bed. Apply heat:  Purpose: To treat any soreness and discomfort from the procedure. When: Starting the next day after the procedure. How: Apply heat to procedure site starting the day following the procedure. How long: May continue to repeat daily, until discomfort goes away. Food intake: Start with clear liquids (like water) and advance to regular food, as tolerated.  Physical activities: Keep activities to a minimum for the first 8 hours after the procedure. After that, then as tolerated. Driving: If you have received any sedation, be responsible and do not drive. You are not allowed to drive for 24 hours after having sedation. Blood thinner: (Applies only to those taking blood thinners) You may restart your blood thinner 6 hours after your procedure. Insulin: (Applies only to Diabetic patients taking insulin) As soon as you can eat, you may resume your normal dosing schedule. Infection prevention: Keep procedure site clean and dry. Shower daily and clean area with soap and water. Post-procedure Pain Diary: Extremely important that this be done correctly and accurately. Recorded information will be used to determine the next step in treatment. For the purpose of accuracy, follow these rules: Evaluate only the area treated. Do not report or include pain from an untreated area. For the purpose of this evaluation, ignore all other areas of pain, except for the treated area. After  your procedure, avoid taking a long nap and attempting to complete the pain diary after you wake up. Instead, set your alarm clock to go off every hour, on the hour, for the initial 8 hours after the procedure. Document the duration of the numbing medicine, and the relief you are getting from it. Do not go to sleep and attempt to complete it later. It will not be accurate. If you received sedation, it is likely that you were given a medication that may cause amnesia. Because of this, completing the diary at a later time may cause the information to be inaccurate. This information is needed to plan your care. Follow-up appointment: Keep your post-procedure follow-up evaluation appointment after the procedure (usually 2 weeks for most procedures, 6 weeks for radiofrequencies). DO NOT FORGET to bring you pain diary with you.   Expect: (What should I expect to see with my procedure?) From numbing medicine (AKA: Local Anesthetics): Numbness or decrease in pain. You may also experience some weakness, which if present, could last for the duration of the local anesthetic. Onset: Full effect within 15 minutes of injected. Duration: It will depend on the type of local anesthetic used. On the average, 1 to 8 hours.  From steroids (Applies only if steroids were used): Decrease in swelling or inflammation. Once inflammation is improved, relief of the pain will follow. Onset of benefits: Depends on the amount of swelling present. The more swelling, the longer it will take for the benefits to be seen. In some cases, up to 10 days. Duration: Steroids will stay in the system x 2 weeks. Duration of benefits will depend on multiple posibilities including persistent irritating factors. Side-effects: If present, they  may typically last 2 weeks (the duration of the steroids). Frequent: Cramps (if they occur, drink Gatorade and take over-the-counter Magnesium 450-500 mg once to twice a day); water retention with temporary  weight gain; increases in blood sugar; decreased immune system response; increased appetite. Occasional: Facial flushing (red, warm cheeks); mood swings; menstrual changes. Uncommon: Long-term decrease or suppression of natural hormones; bone thinning. (These are more common with higher doses or more frequent use. This is why we prefer that our patients avoid having any injection therapies in other practices.)  Very Rare: Severe mood changes; psychosis; aseptic necrosis. From procedure: Some discomfort is to be expected once the numbing medicine wears off. This should be minimal if ice and heat are applied as instructed.  Call if: (When should I call?) You experience numbness and weakness that gets worse with time, as opposed to wearing off. New onset bowel or bladder incontinence. (Applies only to procedures done in the spine)  Emergency Numbers: Durning business hours (Monday - Thursday, 8:00 AM - 4:00 PM) (Friday, 9:00 AM - 12:00 Noon): [phone] After hours: [phone] NOTE: If you are having a problem and are unable connect with, or to talk to a provider, then go to your nearest urgent care or emergency department. If the problem is serious and urgent, please call 911. ____________________________________________________________________________________________  ____________________________________________________________________________________________  Virtual Visits   What is a "Virtual Visit"? It is a Metallurgist (medical visit) that takes place on real time (NOT TEXT or E-MAIL) over the telephone or computer device (desktop, laptop, tablet, smart phone, etc.). It allows for more location flexibility between the patient and the healthcare provider.  Who decides when these types of visits will be used? The physician.  Who is eligible for these types of visits? Only those patients that can be reliably reached over the telephone.  What do you mean by  reliably? We do not have time to call everyone multiple times, therefore those that tend to screen calls and then call back later are not suitable candidates for this system. We understand how people are reluctant to pickup on "unknown" calls, therefore, we suggest adding our telephone numbers to your list of "CONTACT(s)". This way, you should be able to readily identify our calls when you receive one. All of our numbers are available below.   Who is not eligible? This option is not available for medication management encounters, specially for controlled substances. Patients on pain medications that fall under the category of controlled substances have to come in for "Face-to-Face" encounters. This is required for mandatory monitoring of these substances. You may be asked to provide a sample for an unannounced urine drug screening test (UDS), and we will need to count your pain pills. Not bringing your pills to be counted may result in no refill. Obviously, neither one of these can be done over the phone.  When will this type of visits be used? You can request a virtual visit whenever you are physically unable to attend a regular appointment. The decision will be made by the physician (or healthcare provider) on a case by case basis.   At what time will I be called? This is an excellent question. The providers will try to call you whenever they have time available. Do not expect to be called at any specific time. The secretaries will assign you a time for your virtual visit appointment, but this is done simply to keep a list of those patients that need to be called,  but not for the purpose of keeping a time schedule. Be advised that the call may come in anytime during the day, between the hours of 8:00 AM and 8::00 PM, depending on provider availability. We do understand that the system is not perfect. If you are unable to be available that day on a moments notice, then request an "in-person" appointment  rather than a "virtual visit".  Can I request my medication visits to be "Virtual"? Yes you may request it, but the decision is entirely up to the healthcare provider. Control substances require specific monitoring that requires Face-to-Face encounters. The number of encounters  and the extent of the monitoring is determined on a case by case basis.  Add a new contact to your smart phone and label it "PAIN CLINIC" Under this contact add the following numbers: Main: [phone] (Official Contact Number) Nurses: [phone] (These are outgoing only calling systems. Do not call this number.) Dr. Dossie Arbour: [phone] or [phone] (Outgoing calls only. Do not call this number.)  ____________________________________________________________________________________________   ______________________________________________________________________________________________  Body mass index (BMI)  Body mass index (BMI) is a common tool for deciding whether a person has an appropriate body weight.  It measures a persons weight in relation to their height.   According to the [facility] of health (NIH): A BMI of less than 18.5 means that a person is underweight. A BMI of between 18.5 and 24.9 is ideal. A BMI of between 25 and 29.9 is overweight. A BMI over 30 indicates obesity.  Weight Management Required  URGENT: Your weight has been found to be adversely affecting your health.  Dear Kara [name]:  Your current Estimated body mass index is 35.25 kg/m as calculated from the following:   Height as of 03/18/22: _0  (1.6 m).   Weight as of 03/18/22: 199 lb (90.3 kg).  Please use the table below to identify your weight category and associated incidence of chronic pain, secondary to your weight.  Body Mass Index (BMI) Classification BMI level (kg/m2) Category Associated incidence of chronic pain  <18  Underweight   18.5-24.9 Ideal body weight   25-29.9 Overweight  20%   30-34.9 Obese (Class I)  68%  35-39.9 Severe obesity (Class II)  136%  >40 Extreme obesity (Class III)  254%   In addition: You will be considered "Morbidly Obese", if your BMI is above 30 and you have one or more of the following conditions which are known to be caused and/or directly associated with obesity: 1.    Type 2 Diabetes (Which in turn can lead to cardiovascular diseases (CVD), stroke, peripheral vascular diseases (PVD), retinopathy, nephropathy, and neuropathy) 2.    Cardiovascular Disease (High Blood Pressure; Congestive Heart Failure; High Cholesterol; Coronary Artery Disease; Angina; or History of Heart Attacks) 3.    Breathing problems (Asthma; obesity-hypoventilation syndrome; obstructive sleep apnea; chronic inflammatory airway disease; reactive airway disease; or shortness of breath) 4.    Chronic kidney disease 5.    Liver disease (nonalcoholic fatty liver disease) 6.    High blood pressure 7.    Acid reflux (gastroesophageal reflux disease; heartburn) 8.    Osteoarthritis (OA) (with any of the following: hip pain; knee pain; and/or low back pain) 9.    Low back pain (Lumbar Facet Syndrome; and/or Degenerative Disc Disease) 10.  Hip pain (Osteoarthritis of hip) (For every 1 lbs of added body weight, there is a 2 lbs increase in pressure inside of each hip articulation. 1:2 mechanical  relationship) 11.  Knee pain (Osteoarthritis of knee) (For every 1 lbs of added body weight, there is a 4 lbs increase in pressure inside of each knee articulation. 1:4 mechanical relationship) (patients with a BMI>30 kg/m2 were 6.8 times more likely to develop knee OA than normal-weight individuals) 12.  Cancer: Epidemiological studies have shown that obesity is a risk factor for: post-menopausal breast cancer; cancers of the endometrium, colon and kidney cancer; malignant adenomas of the oesophagus. Obese subjects have an approximately 1.5-3.5-fold increased risk of developing these cancers  compared with normal-weight subjects, and it has been estimated that between 15 and 45% of these cancers can be attributed to overweight. More recent studies suggest that obesity may also increase the risk of other types of cancer, including pancreatic, hepatic and gallbladder cancer. (Ref: Obesity and cancer. Pischon T, Nthlings U, [employer] H. Proc Nutr Soc. 2008 May;67(2):128-45. doi: 33.2951/O8416606301601093.) The International Agency for Research on Cancer (IARC) has identified 13 cancers associated with overweight and obesity: meningioma, multiple myeloma, adenocarcinoma of the esophagus, and cancers of the thyroid, postmenopausal breast cancer, gallbladder, stomach, liver, pancreas, kidney, ovaries, uterus, colon and rectal (colorectal) cancers. 43 percent of all cancers diagnosed in women and 24 percent of those diagnosed in men are associated with overweight and obesity.  Recommendation: At this point it is urgent that you take a step back and concentrate in loosing weight. Dedicate 100% of your efforts on this task. Nothing else will improve your health more than bringing your weight down and your BMI to less than 30. If you are here, you probably have chronic pain. We know that most chronic pain patients have difficulty exercising secondary to their pain. For this reason, you must rely on proper nutrition and diet in order to lose the weight. If your BMI is above 40, you should seriously consider bariatric surgery. A realistic goal is to lose 10% of your body weight over a period of 12 months.  Be honest to yourself, if over time you have unsuccessfully tried to lose weight, then it is time for you to seek professional help and to enter a medically supervised weight management program, and/or undergo bariatric surgery. Stop procrastinating.   Pain management considerations:  1.    Pharmacological Problems: Be advised that the use of opioid analgesics (oxycodone; hydrocodone; morphine; methadone;  codeine; and all of their derivatives) have been associated with decreased metabolism and weight gain.  For this reason, should we see that you are unable to lose weight while taking these medications, it may become necessary for [place] to taper down and indefinitely discontinue them.  2.    Technical Problems: The incidence of successful interventional therapies decreases as the patient's BMI increases. It is much more difficult to accomplish a safe and effective interventional therapy on a patient with a BMI above 35. 3.    Radiation Exposure Problems: The x-rays machine, used to accomplish injection therapies, will automatically increase their x-ray output in order to capture an appropriate bone image. This means that radiation exposure increases exponentially with the patient's BMI. (The higher the BMI, the higher the radiation exposure.) Although the level of radiation used at a given time is still safe to the patient, it is not for the physician and/or assisting staff. Unfortunately, radiation exposure is accumulative. Because physicians and the staff have to do procedures and be exposed on a daily basis, this can result in health problems such as cancer and radiation burns. Radiation exposure to the staff is monitored by  the radiation batches that they wear. The exposure levels are reported back to the staff on a quarterly basis. Depending on levels of exposure, physicians and staff may be obligated by law to decrease this exposure. This means that they have the right and obligation to refuse providing therapies where they may be overexposed to radiation. For this reason, physicians may decline to offer therapies such as radiofrequency ablation or implants to patients with a BMI above 40. 4.    Current Trends: Be advised that the current trend is to no longer offer certain therapies to patients with a BMI equal to, or above 35, due to increase perioperative risks, increased technical procedural difficulties,  and excessive radiation exposure to healthcare personnel.  ______________________________________________________________________________________________

## 2022-04-07 ENCOUNTER — Telehealth: Payer: Self-pay | Admitting: *Deleted

## 2022-04-07 NOTE — Telephone Encounter (Signed)
No problems post procedure. 

## 2022-04-17 NOTE — Progress Notes (Signed)
PROVIDER NOTE: Information contained herein reflects review and annotations entered in association with encounter. Interpretation of such information and data should be left to medically-trained personnel. Information provided to patient can be located elsewhere in the medical record under "Patient Instructions". Document created using STT-dictation technology, any transcriptional errors that may result from process are unintentional.    Patient: Kara [name]  Service Category: E/M  Provider: Gaspar Cola, MD  DOB: [birth year]  DOS: 04/22/2022  Referring Provider: Tracie Harrier, MD  MRN: [id number]  Specialty: Interventional Pain Management  PCP: Tracie Harrier, MD  Type: Established Patient  Setting: Ambulatory outpatient    Location: Office  Delivery: Face-to-face     HPI  Kara [name], a 71 y.o. year old female, is here today because of her Chronic bilateral low back pain without sciatica [M54.50, G89.29]. [name] primary complain today is Back Pain (low) Last encounter: My last encounter with her was on 04/06/2022. Pertinent problems: 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); Lumbar pars defect (L3 and L4) (Bilateral); Diabetic peripheral neuropathy ([place]); Chronic lower extremity pain (2ry area of Pain) (Bilateral); Chronic radicular pain of lower extremity; Osteoarthritis of hip (Right); Chronic low back pain (1ry area of Pain) (Bilateral) w/o  sciatica; Spondylosis without myelopathy or radiculopathy, lumbosacral region; Other specified dorsopathies, sacral and sacrococcygeal region; Secondary osteoarthritis of multiple sites; Sacral insufficiency fracture; DDD (degenerative disc disease), lumbosacral; Coccygodynia; Chronic sacroiliac joint pain (Left); Osteoarthritis of lumbar spine; Spondylolisthesis, lumbosacral region; Abnormal MRI, lumbar spine (08/09/2019); Spasm of muscle of lower back; Chronic hip pain (Bilateral) (R>L); Osteoarthritis of hips (Bilateral); Greater trochanteric bursitis (Right); Chronic hip pain (Right); Chronic low back pain (Right) w/o sciatica; Greater trochanteric bursitis (Left); and Other bursitis of hip, not elsewhere classified (gluteus medius bursa) (Left) on their pertinent problem list. Pain Assessment: Severity of Chronic pain is reported as a 3 /10. Location: Back Lower/raditaes to both knees. Onset: More than a month ago. Quality: Aching, Dull. Timing: Constant. Modifying factor(s): lying down. Vitals:  height is _0  (1.6 m) and weight is 200 lb (90.7 kg). Her temperature is 97.5 F (36.4 C) (abnormal). Her blood pressure is 146/67 (abnormal) and her pulse is 87. Her respiration is 16 and oxygen saturation is 96%.   Reason for encounter: post-procedure evaluation and assessment.  The patient refers that this particular procedure seems to have worked better than the others.  This is interesting since I concentrated on the lower 2 facets instead of the lower 3.  I will continue to see her on a as needed basis.  Post-procedure evaluation   Type: Lumbar Facet, Medial Branch Block(s) #1 of 2023 Laterality: Bilateral  Level: L3, L4, L5, & S1 Medial Branch Level(s). Injecting these levels blocks the L4-5 and L5-S1 lumbar facet joints. Imaging: Fluoroscopic guidance         Anesthesia: Local anesthesia (1-2% Lidocaine) Anxiolysis: IV Versed         Sedation: No Sedation                       DOS:  04/06/2022 Performed by: Gaspar Cola, MD  Primary Purpose: Diagnostic/Therapeutic Indications: Low back pain severe enough to impact quality of life or function. 1. Lumbar facet syndrome (Bilateral)   2. Spondylosis without myelopathy or radiculopathy, lumbosacral region   3. Lumbar facet arthropathy (L3-4, L4-5, and L5-S1) (Bilateral)   4. 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 Osteoarthritis of lumbar spine   7. Spondylolisthesis, lumbosacral region   8. Chronic low back pain (1ry area of Pain) (Bilateral) w/o sciatica   9. Severe obesity with body mass index (BMI) of 35.0 to 39.9 with comorbidity ([place])   10. Chronic anticoagulation ([place]AD[place])   11. Latex precautions, history of latex allergy    NAS-11 Pain score:   Pre-procedure: 10-Worst pain ever/10   Post-procedure: 8  (after injection, MD in to talk with patient)/10  Note: The patient requested an IM Toradol injection approximately 20 minutes after the procedure when she should have had the full effect of the local anesthetic.  This would suggest that the pain that she he is currently experiencing is not coming from the lumbar facet joints.  She does have multilevel grade 1-2 Anterolisthesis of L4-5 and L5-S1.  According to the patient's last MRI on 08/09/2019 this anterolisthesis has progressed at both levels.  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     Effectiveness:  Initial hour after procedure: 85 %. Subsequent 4-6 hours post-procedure: 85 %. Analgesia past initial 6 hours: 90 % (ongoing). Ongoing improvement:  Analgesic: The patient refers having an ongoing 90% improvement of her low back pain. Function: [name] reports improvement in function ROM: [name] reports improvement in ROM  Pharmacotherapy Assessment  Analgesic: Oxycodone IR 10 mg, 1 tab PO  qd,  from PCP. No chronic opioid analgesics therapy prescribed by our practice, 2ry to Medication Agreement Violation. (Getting oxycodone from PCP in multiple occasions, according to PMP)(11/21/20; 12/26/20; 01/16/21; 02/13/21). Opioid analgesic pharmacotherapy D/C'ed on 03/03/2021. MME: 20 mg/day.   Monitoring: [place] PMP: PDMP reviewed during this encounter.       Pharmacotherapy: No side-effects or adverse reactions reported. Compliance: No problems identified. Effectiveness: Clinically acceptable.  Dewayne Shorter, RN  04/22/2022 12:57 PM  Sign when Signing Visit Safety precautions to be maintained throughout the outpatient stay will include: orient to surroundings, keep bed in low position, maintain call bell within reach at all times, provide assistance with transfer out of bed and ambulation.    No results found for: "CBDTHCR" No results found for: "D8THCCBX" No results found for: "D9THCCBX"  UDS:  Summary  Date Value Ref Range Status  11/12/2020 Note  Final    Comment:    ==================================================================== [employer] [facility] 13 (MW) ==================================================================== Test                             Result       Flag       Units  Drug Present and Declared for Prescription Verification   Norhydrocodone                 61           EXPECTED   ng/mg creat    Norhydrocodone is an expected metabolite of hydrocodone.    Tramadol                       >  4202        EXPECTED   ng/mg creat   O-Desmethyltramadol            >4202        EXPECTED   ng/mg creat   N-Desmethyltramadol            1712         EXPECTED   ng/mg creat    Source of tramadol is a prescription medication. O-desmethyltramadol    and N-desmethyltramadol are expected metabolites of tramadol.  Drug Absent but Declared for Prescription Verification   Hydrocodone                    Not Detected UNEXPECTED ng/mg creat    Hydrocodone is almost always present in  patients taking this drug    consistently. Absence of hydrocodone could be due to lapse of time    since the last dose or unusual pharmacokinetics (rapid metabolism).  ==================================================================== Test                      Result    Flag   Units      Ref Range   Creatinine              119              mg/dL      >=20 ==================================================================== Declared Medications:  The flagging and interpretation on this report are based on the  following declared medications.  Unexpected results may arise from  inaccuracies in the declared medications.   **Note: The testing scope of this panel includes these medications:   Hydrocodone  Tramadol   **Note: The testing scope of this panel does not include the  following reported medications:   Acetaminophen  Alendronate (Fosamax)  Aspirin  Calcium  Candesartan  Cholecalciferol  Cyanocobalamin  Cyclobenzaprine  Dulaglutide  Gabapentin  Glipizide  Meclizine  Multivitamin  Nystatin  Ondansetron  Ropinirole  Tolterodine  Warfarin ==================================================================== For clinical consultation, please call [phone]. ====================================================================       ROS  Constitutional: Denies any fever or chills Gastrointestinal: No reported hemesis, hematochezia, vomiting, or acute GI distress Musculoskeletal: Denies any acute onset joint swelling, redness, loss of ROM, or weakness Neurological: No reported episodes of acute onset apraxia, aphasia, dysarthria, agnosia, amnesia, paralysis, loss of coordination, or loss of consciousness  Medication Review  Dulaglutide, HYDROcodone-acetaminophen, Vitamin B-12, alendronate, calcium carbonate, candesartan, cholecalciferol, cyclobenzaprine, gabapentin, glipiZIDE, meclizine, multivitamin with minerals, oxybutynin, rOPINIRole, and warfarin  History  Review  Allergy: Ms. Brix is allergic to adhesive [tape], other, latex, and morphine and related. Drug: Ms. Meding  reports no history of drug use. Alcohol:  reports no history of alcohol use. Tobacco:  reports that she has never smoked. She has never used smokeless tobacco. Social: Ms. Spatz  reports that she has never smoked. She has never used smokeless tobacco. She reports that she does not drink alcohol and does not use drugs. 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 Surgical: Ms. Geerdes  has a past surgical history that includes Knee surgery; Carpal tunnel release (Bilateral); Colon resection sigmoid (N/A, 11/02/2016); Colostomy (N/A, 11/02/2016); Sigmoidoscopy (N/A, 11/13/2016); PULMONARY VENOGRAPHY (N/A, 11/19/2016); Colonoscopy with propofol (N/A, 02/03/2017); Colon surgery (11/02/2016); IVC FILTER INSERTION (Right, 10/2016);  Colostomy reversal (N/A, 02/24/2017); Appendectomy (N/A, 02/24/2017); Lysis of adhesion (N/A, 02/24/2017); laparoscopy (N/A, 02/24/2017); [place]o loop diversion (N/A, 02/24/2017); Sigmoidoscopy (N/A, 05/19/2017); Ileostomy closure (N/A, 06/01/2017); IVC FILTER REMOVAL (N/A, 08/09/2017); Sacroplasty (N/A, 11/08/2017); Cataract extraction w/PHACO (Left, 11/21/2018); Cataract extraction w/PHACO (Right, 12/12/2018); and Video bronchoscopy with endobronchial navigation (N/A, 02/23/2021). Family: family history includes Breast cancer in her mother; Cancer in her mother; Diabetes in her mother and sister; Heart disease in her father, mother, and sister.  Laboratory Chemistry Profile   Renal Lab Results  Component Value Date   BUN 10 11/22/2021   CREATININE 0.56 11/22/2021   GFRAA >60 07/09/2019   GFRNONAA  >60 11/22/2021    Hepatic Lab Results  Component Value Date   AST 25 11/20/2021   ALT 31 11/20/2021   ALBUMIN 3.4 (L) 11/20/2021   ALKPHOS 38 11/20/2021   LIPASE 22 11/20/2021    Electrolytes Lab Results  Component Value Date   NA 142 11/22/2021   K 3.7 11/22/2021   CL 112 (H) 11/22/2021   CALCIUM 7.7 (L) 11/22/2021   MG 1.7 08/14/2020   PHOS 3.3 11/07/2016    Bone Lab Results  Component Value Date   25OHVITD1 25 (L) 11/02/2017   25OHVITD2 <1.0 11/02/2017   25OHVITD3 25 11/02/2017    Inflammation (CRP: Acute Phase) (ESR: Chronic Phase) Lab Results  Component Value Date   CRP 1.7 (H) 08/15/2020   ESRSEDRATE 12 11/02/2017   LATICACIDVEN 1.7 11/21/2021         Note: Above Lab results reviewed.  Recent Imaging Review  DG PAIN CLINIC C-ARM 1-60 MIN NO REPORT Fluoro was used, but no Radiologist interpretation will be provided.  Please refer to "NOTES" tab for provider progress note. Note: Reviewed        Physical Exam  General appearance: Well nourished, well developed, and well hydrated. In no apparent acute distress Mental status: Alert, oriented x 3 (person, place, & time)       Respiratory: No evidence of acute respiratory distress Eyes: PERLA Vitals: BP (!) 146/67   Pulse 87   Temp (!) 97.5 F (36.4 C)   Resp 16   Ht _0  (1.6 m)   Wt 200 lb (90.7 kg)   SpO2 96%   BMI 35.43 kg/m  BMI: Estimated body mass index is 35.43 kg/m as calculated from the following:   Height as of this encounter: _1  (1.6 m).   Weight as of this encounter: 200 lb (90.7 kg). Ideal: Ideal body weight: 52.4 kg (115 lb 8.3 oz) Adjusted ideal body weight: 67.7 kg (149 lb 5 oz)  Assessment   Diagnosis Status  1. Chronic low back pain (1ry area of Pain) (Bilateral) w/o sciatica   2. Lumbar facet syndrome (Bilateral)   3. Grade 1-2 Anterolisthesis of L4/L5 & L5/S1    Improved Improved Stable   Updated Problems: No problems updated.  Plan of Care  Problem-specific:  No  problem-specific Assessment & Plan notes found for this encounter.  Ms. Tanisha Lutes Plake has a current medication list which includes the following long-term medication(s): candesartan, glipizide, ropinirole, warfarin, and calcium carbonate.  Pharmacotherapy (Medications Ordered): No orders of the defined types were placed in this encounter.  Orders:  No orders of the defined types were placed in this encounter.  Follow-up plan:   Return if symptoms worsen or fail to improve.     Interventional Therapies  Risk  Complexity Considerations:   Estimated body mass index is 37.45 kg/m as calculated  from the following:   Height as of 05/05/21: _0  (1.6 m).   Weight as of 05/13/21: 211 lb 6.4 oz (95.9 kg). 1.  Coumadin ANTICOAGULATION: (Stop: 5 days  Re-start: 2hrs) 2.  Latex ALLERGY  3.  Morbid obesity (class III) (BMI>40)  4.  No Lumbar RFA due to morbid obesity    Planned  Pending:   (of 2023) w/ Dr. Gillis Santa    Under consideration:   Consider bilateral hip joint MRI    Completed:   Therapeutic bilateral lumbar facet MBB (L3-S1) x1 (04/06/2022) (80/80/90/90)  Diagnostic/therapeutic right IA hip inj. x2 (01/28/2022) (100/100/85/85)  Diagnostic/therapeutic left IA hip inj. x1 (01/28/2022) (100/100/85/85)  Diagnostic/therapeutic right trochanteric bursa inj. x1 (09/15/2021) (85/85/85)  Diagnostic/therapeutic left trochanteric bursa inj. x1 (01/28/2022) (100/100/85/85)  Therapeutic left lumbar facet RFA x1 (07/21/2021) (100/100/100 x1 week/80)  Therapeutic right lumbar facet RFA x1 (07/07/2021) (100/100/80/80)  Palliative/Therapeutic left L4-5 LESI x1 (11/10/2017) (100/80/0/0-25)  Diagnostic/therapeutic right L4 TFESI x1 (11/19/2021) (90/80/0/0) (fell at [employer]) Palliative left lumbar facet MBB x17 (05/05/2021) (100/100/75/85)  Palliative right lumbar facet MBB x23 (05/05/2021) (100/100/75/85)  Palliative right SI joint block x9 (01/28/2022) (100/100/80/80)  Diagnostic  left SI joint block x2 (01/28/2022) (100/100/80/80)  Palliative/Therapeutic (Midline) caudal ESI x3 (06/08/2018) (100/100/100 x 2-3 days/0)    Therapeutic  Palliative (PRN) options:   NO PRN procedures without F2F exam by Dr. Dossie Arbour   Pharmacotherapy:  Nonopioids transferred 03/27/2020: Calcium No chronic opioid analgesics therapy prescribed by our practice. Recommendations:   None at this time.      Recent Visits Date Type Provider Dept  04/06/22 Procedure visit Milinda Pointer, MD [name]  03/18/22 Office Visit Milinda Pointer, MD [name]  02/11/22 Office Visit Milinda Pointer, MD [name]  01/28/22 Procedure visit Milinda Pointer, MD [name]  Showing recent visits within past 90 days and meeting all other requirements Today's Visits Date Type Provider Dept  04/22/22 Office Visit Milinda Pointer, MD [name]  Showing today's visits and meeting all other requirements Future Appointments No visits were found meeting these conditions. Showing future appointments within next 90 days and meeting all other requirements  I discussed the assessment and treatment plan with the patient. The patient was provided an opportunity to ask questions and all were answered. The patient agreed with the plan and demonstrated an understanding of the instructions.  Patient advised to call back or seek an in-person evaluation if the symptoms or condition worsens.  Duration of encounter: 30 minutes.  Total time on encounter, as per AMA guidelines included both the face-to-face and non-face-to-face time personally spent by the physician and/or other qualified health care professional(s) on the day of the encounter (includes time in activities that require the physician or other qualified health care professional and does not include time in activities normally performed by clinical staff). Physician's time may include the  following activities when performed: preparing to see the patient (eg, review of tests, pre-charting review of records) obtaining and/or reviewing separately obtained history performing a medically appropriate examination and/or evaluation counseling and educating the patient/family/caregiver ordering medications, tests, or procedures referring and communicating with other health care professionals (when not separately reported) documenting clinical information in the electronic or other health record independently interpreting results (not separately reported) and communicating results to the patient/ family/caregiver care coordination (not separately reported)  Note by: Gaspar Cola, MD Date: 04/22/2022; Time: 1:07 PM

## 2022-04-22 ENCOUNTER — Ambulatory Visit: Payer: Self-pay | Attending: Pain Medicine | Admitting: Pain Medicine

## 2022-04-22 VITALS — BP 146/67 | HR 87 | Temp 97.5°F | Resp 16 | Ht 63.0 in | Wt 200.0 lb

## 2022-04-22 DIAGNOSIS — M47816 Spondylosis without myelopathy or radiculopathy, lumbar region: Secondary | ICD-10-CM | POA: Insufficient documentation

## 2022-04-22 DIAGNOSIS — M4310 Spondylolisthesis, site unspecified: Secondary | ICD-10-CM | POA: Insufficient documentation

## 2022-04-22 DIAGNOSIS — G8929 Other chronic pain: Secondary | ICD-10-CM | POA: Insufficient documentation

## 2022-04-22 DIAGNOSIS — M545 Low back pain, unspecified: Secondary | ICD-10-CM | POA: Insufficient documentation

## 2022-04-22 NOTE — Progress Notes (Signed)
Safety precautions to be maintained throughout the outpatient stay will include: orient to surroundings, keep bed in low position, maintain call bell within reach at all times, provide assistance with transfer out of bed and ambulation.  

## 2022-04-23 DIAGNOSIS — R791 Abnormal coagulation profile: Secondary | ICD-10-CM | POA: Diagnosis not present

## 2022-05-11 IMAGING — CR DG HIP (WITH OR WITHOUT PELVIS) 2-3V*R*
3 series · 3 of 3 positions shown · non-contrast
Comparison: 09/26/2017

CLINICAL DATA: Chronic bilateral hip pain, history of prior
sacroplasty

EXAM:
DG HIP (WITH OR WITHOUT PELVIS) 2-3V RIGHT; DG HIP (WITH OR WITHOUT
PELVIS) 2-3V LEFT

[pelvis ap]
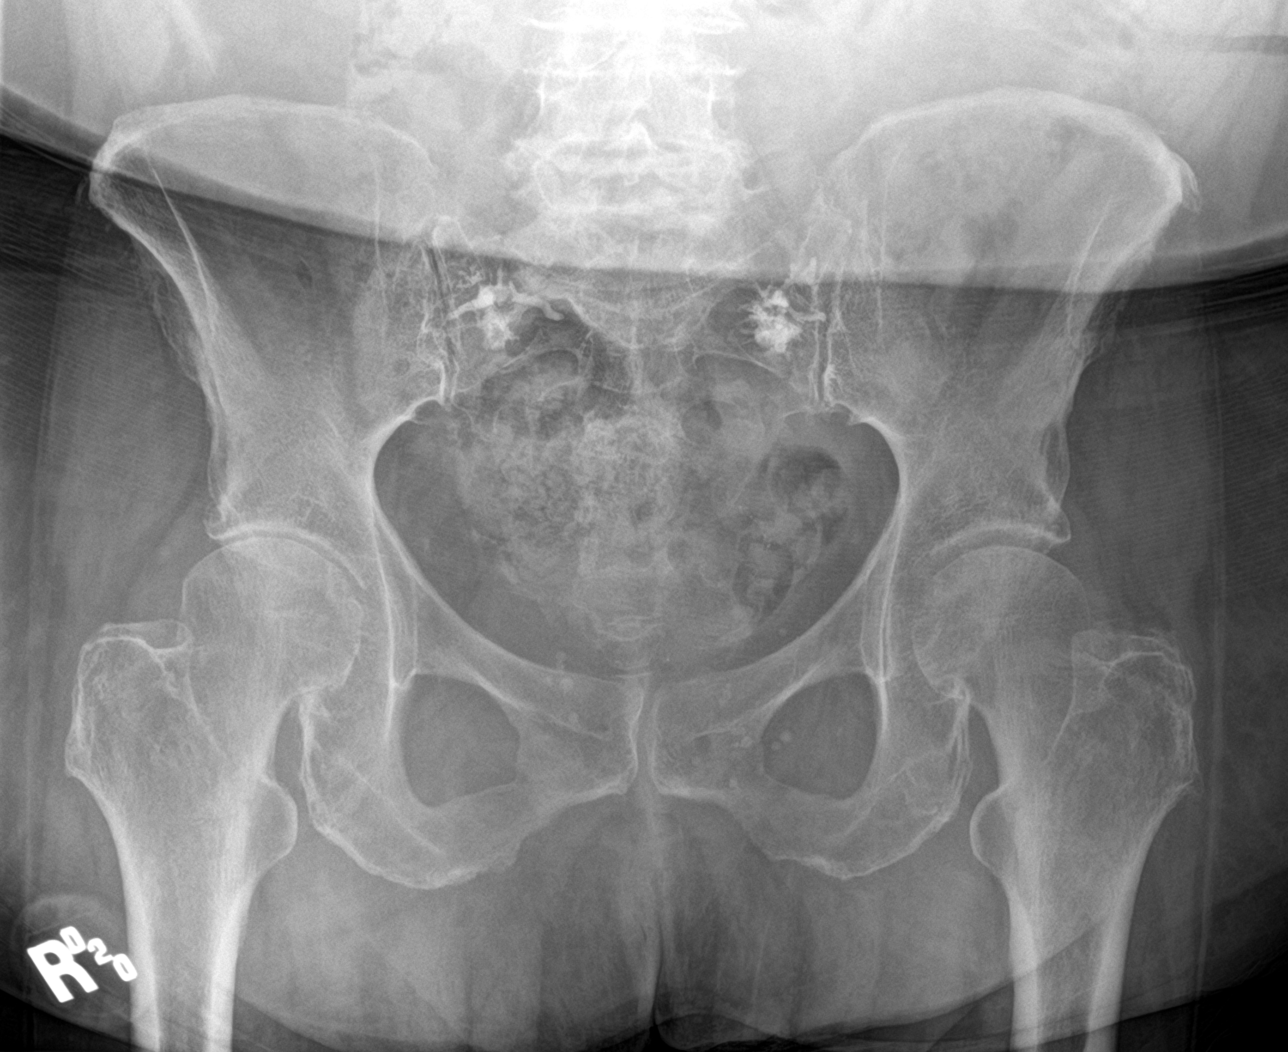

[hip ap]
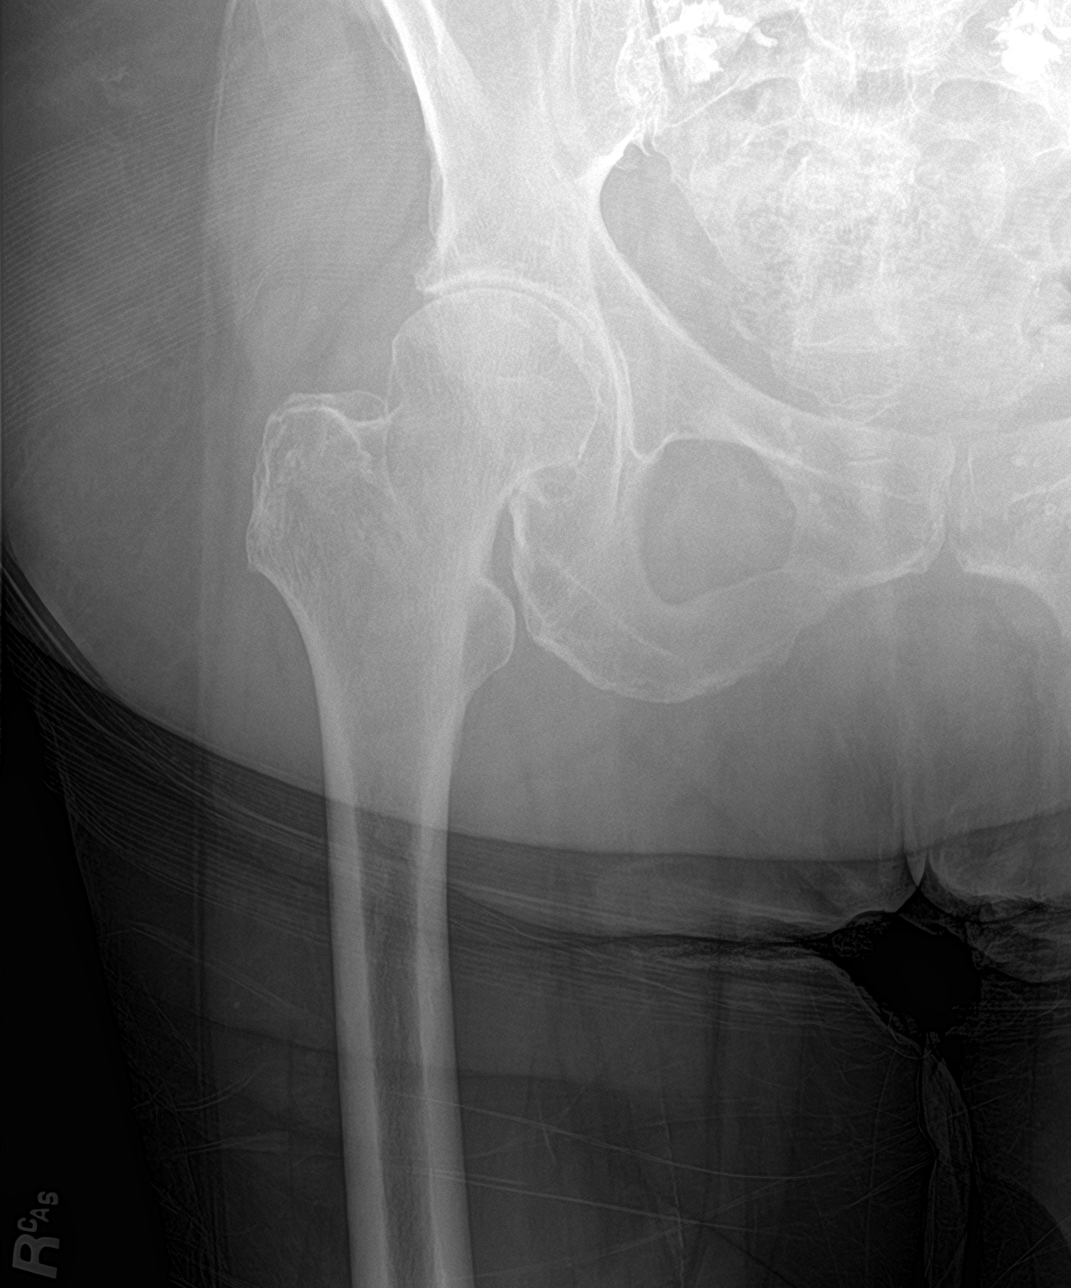

[hip lat]
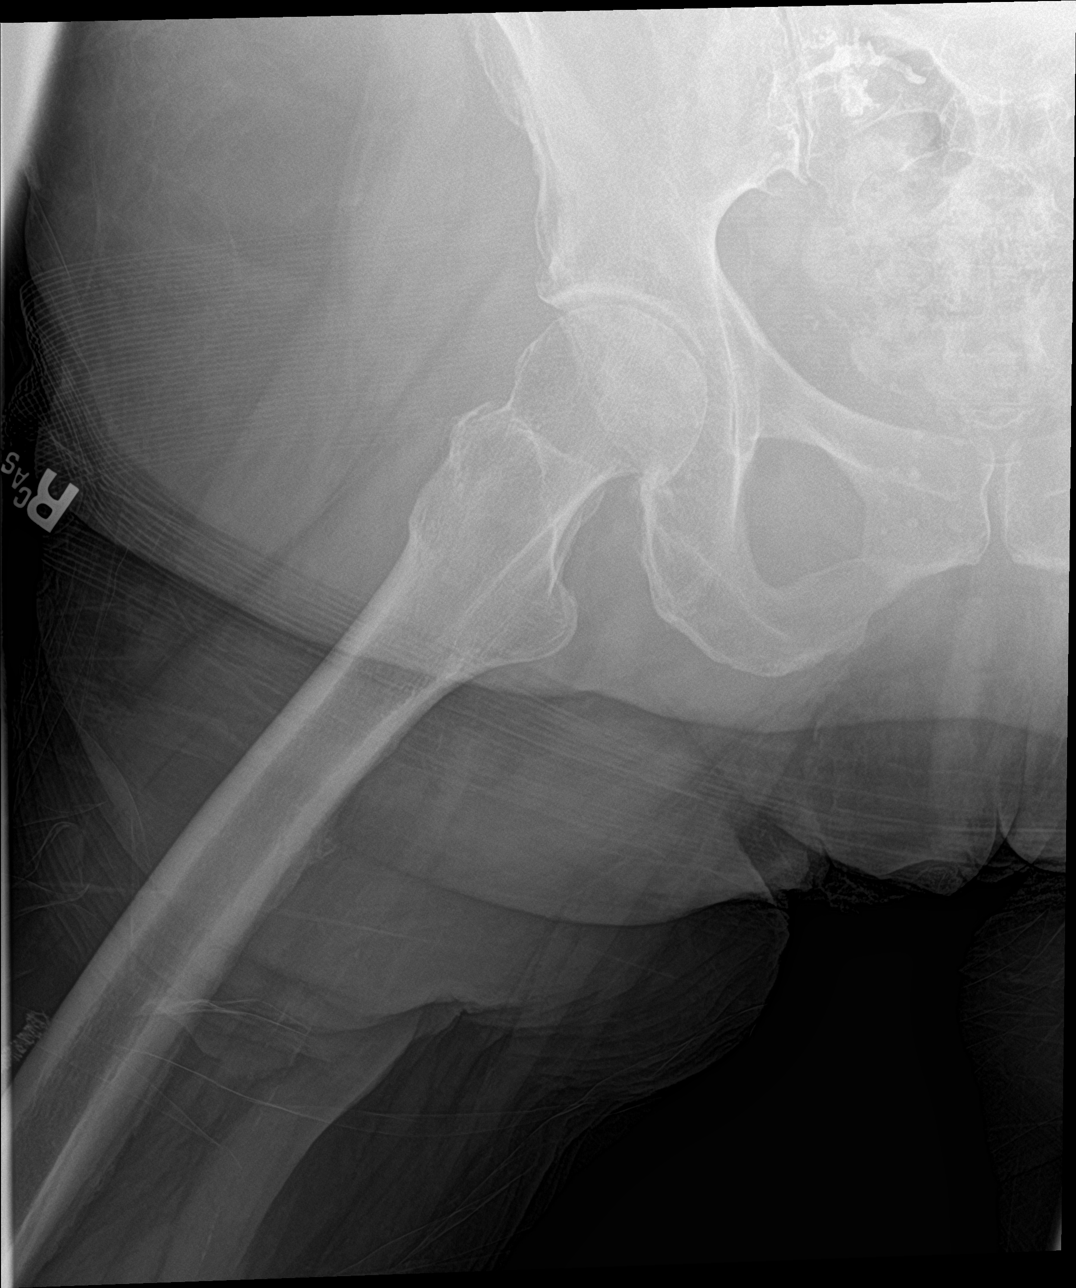

[3 of 3 positions shown; findings below may reference images not displayed]

FINDINGS: Frontal view of the pelvis as well as frontal and frogleg lateral
views of both hips are obtained.

Right hip: No fracture, subluxation, or dislocation. Moderate joint
space narrowing and osteophyte formation consistent with
osteoarthritis. Evidence of prior sacroplasty. The remainder of the
bony pelvis is unremarkable. Stable lower lumbar spondylosis.

Left hip: No fracture, subluxation, or dislocation. Joint space is
well preserved. Evidence of prior sacroplasty. Remainder of the
visualized pelvis is unremarkable.
IMPRESSION: 1. Moderate right hip osteoarthritis.
2. Unremarkable left hip.
3. Stable lower lumbar spondylosis.
4. Postsurgical changes from prior sacroplasty.

## 2022-05-21 DIAGNOSIS — R791 Abnormal coagulation profile: Secondary | ICD-10-CM | POA: Diagnosis not present

## 2022-05-21 DIAGNOSIS — Z7901 Long term (current) use of anticoagulants: Secondary | ICD-10-CM | POA: Diagnosis not present

## 2022-05-27 ENCOUNTER — Telehealth: Payer: Self-pay | Admitting: Pain Medicine

## 2022-05-27 NOTE — Telephone Encounter (Signed)
There is already an order for a lumbar facet block. This may be scheduled.

## 2022-05-27 NOTE — Telephone Encounter (Signed)
PT wants to know if Kara [name] would put an order in for another injection. Please give patient a call. Thanks

## 2022-06-13 NOTE — Progress Notes (Signed)
PROVIDER NOTE: Interpretation of information contained herein should be left to medically-trained personnel. Specific patient instructions are provided elsewhere under "Patient Instructions" section of medical record. This document was created in part using STT-dictation technology, any transcriptional errors that may result from this process are unintentional.  Patient: Kara [name] Type: Established DOB: 1951-03-09 MRN: [id number] PCP: Barbette Reichmann, MD  Service: Procedure DOS: 06/15/2022 Setting: Ambulatory Location: Ambulatory outpatient facility Delivery: Face-to-face Provider: Oswaldo Done, MD Specialty: Interventional Pain Management Specialty designation: 09 Location: Outpatient facility Ref. Prov.: Barbette Reichmann, MD    Procedure:           Type: Lumbar Facet, Medial Branch Block(s) #1  Laterality: Bilateral  Level: L2, L3, L4, L5, and S1 Medial Branch Level(s). Injecting these levels blocks the L3-4, L4-5, and L5-S1 lumbar facet joints. Imaging: Fluoroscopic guidance         Anesthesia: Local anesthesia (1-2% Lidocaine) Anxiolysis: IV Versed 2.0 mg Sedation: No Sedation                       DOS: 06/15/2022 Performed by: Oswaldo Done, MD  Primary Purpose: Diagnostic/Therapeutic Indications: Low back pain severe enough to impact quality of life or function. 1. Lumbar facet syndrome (Bilateral)   2. Chronic low back pain (1ry area of Pain) (Bilateral) w/o sciatica   3. DDD (degenerative disc disease), lumbosacral   4. 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 Lumbar facet arthropathy (L3-4, L4-5, and L5-S1) (Bilateral)   6. 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 Spondylosis without myelopathy or radiculopathy, lumbosacral region   8. Abnormal MRI, lumbar spine (08/09/2019)   9. Chronic anticoagulation (COUMADIN)   10. Morbid obesity with BMI of 40.0-44.9, adult (HCC)   11. Truncal obesity    NAS-11 Pain score:   Pre-procedure:  10-Worst pain ever/10   Post-procedure: 9 /10     Position / Prep / Materials:  Position: Prone  Prep solution: DuraPrep (Iodine Povacrylex [0.7% available iodine] and Isopropyl Alcohol, 74% w/w) Area Prepped: Posterolateral Lumbosacral Spine (Wide prep: From the lower border of the scapula down to the end of the tailbone and from flank to flank.)  Materials:  Tray: Block Needle(s):  Type: Spinal  Gauge (G): 22  Length: 5-in Qty: 4     Pre-op H&P Assessment:  Kara [name] is a 72 y.o. (year old), female patient, seen today for interventional treatment. She  has a past surgical history that includes Knee surgery; Carpal tunnel release (Bilateral); Colon resection sigmoid (N/A, 11/02/2016); Colostomy (N/A, 11/02/2016); Sigmoidoscopy (N/A, 11/13/2016); PULMONARY VENOGRAPHY (N/A, 11/19/2016); Colonoscopy with propofol (N/A, 02/03/2017); Colon surgery (11/02/2016); IVC FILTER INSERTION (Right, 10/2016); Colostomy reversal (N/A, 02/24/2017); Appendectomy (N/A, 02/24/2017); Lysis of adhesion (N/A, 02/24/2017); laparoscopy (N/A, 02/24/2017); Ileo loop diversion (N/A, 02/24/2017); Sigmoidoscopy (N/A, 05/19/2017); Ileostomy closure (N/A, 06/01/2017); IVC FILTER REMOVAL (N/A, 08/09/2017); Sacroplasty (N/A, 11/08/2017); Cataract extraction w/PHACO (Left, 11/21/2018); Cataract extraction w/PHACO (Right, 12/12/2018); and Video bronchoscopy with endobronchial navigation (N/A, 02/23/2021). Kara [name] has a current medication list which includes the following prescription(s): alendronate, candesartan, cholecalciferol, vitamin b-12, cyclobenzaprine, dulaglutide, gabapentin, gabapentin, glipizide, hydrocodone-acetaminophen, meclizine, multivitamin with minerals, oxybutynin, ropinirole, warfarin, and calcium carbonate, and the following Facility-Administered Medications: lactated ringers. Her primarily concern today is the Back Pain (lower)  Initial Vital Signs:  Pulse/HCG Rate: 84ECG Heart Rate: 84 Temp: (!) 97.3 F (36.3  C) Resp: (!) 21 BP: (!) 156/84 SpO2: 97 %  BMI: Estimated body mass index is  35.61 kg/m as calculated from the following:   Height as of this encounter: 5\' 3"  (1.6 m).   Weight as of this encounter: 201 lb (91.2 kg).  Risk Assessment: Allergies: Reviewed. She is allergic to adhesive [tape], other, latex, and morphine and related.  Allergy Precautions: None required Coagulopathies: Reviewed. None identified.  Blood-thinner therapy: None at this time Active Infection(s): Reviewed. None identified. Kara [name] is afebrile  Site Confirmation: Kara [name] was asked to confirm the procedure and laterality before marking the site Procedure checklist: Completed Consent: Before the procedure and under the influence of no sedative(s), amnesic(s), or anxiolytics, the patient was informed of the treatment options, risks and possible complications. To fulfill our ethical and legal obligations, as recommended by the American Medical Association's Code of Ethics, I have informed the patient of my clinical impression; the nature and purpose of the treatment or procedure; the risks, benefits, and possible complications of the intervention; the alternatives, including doing nothing; the risk(s) and benefit(s) of the alternative treatment(s) or procedure(s); and the risk(s) and benefit(s) of doing nothing. The patient was provided information about the general risks and possible complications associated with the procedure. These may include, but are not limited to: failure to achieve desired goals, infection, bleeding, organ or nerve damage, allergic reactions, paralysis, and death. In addition, the patient was informed of those risks and complications associated to Spine-related procedures, such as failure to decrease pain; infection (i.e.: Meningitis, epidural or intraspinal abscess); bleeding (i.e.: epidural hematoma, subarachnoid hemorrhage, or any other type of intraspinal or peri-dural bleeding); organ or  nerve damage (i.e.: Any type of peripheral nerve, nerve root, or spinal cord injury) with subsequent damage to sensory, motor, and/or autonomic systems, resulting in permanent pain, numbness, and/or weakness of one or several areas of the body; allergic reactions; (i.e.: anaphylactic reaction); and/or death. Furthermore, the patient was informed of those risks and complications associated with the medications. These include, but are not limited to: allergic reactions (i.e.: anaphylactic or anaphylactoid reaction(s)); adrenal axis suppression; blood sugar elevation that in diabetics may result in ketoacidosis or comma; water retention that in patients with history of congestive heart failure may result in shortness of breath, pulmonary edema, and decompensation with resultant heart failure; weight gain; swelling or edema; medication-induced neural toxicity; particulate matter embolism and blood vessel occlusion with resultant organ, and/or nervous system infarction; and/or aseptic necrosis of one or more joints. Finally, the patient was informed that Medicine is not an exact science; therefore, there is also the possibility of unforeseen or unpredictable risks and/or possible complications that may result in a catastrophic outcome. The patient indicated having understood very clearly. We have given the patient no guarantees and we have made no promises. Enough time was given to the patient to ask questions, all of which were answered to the patient's satisfaction. Ms. Robotham has indicated that she wanted to continue with the procedure. Attestation: I, the ordering provider, attest that I have discussed with the patient the benefits, risks, side-effects, alternatives, likelihood of achieving goals, and potential problems during recovery for the procedure that I have provided informed consent. Date  Time: 06/15/2022 10:09 AM  Pre-Procedure Preparation:  Monitoring: As per clinic protocol. Respiration, ETCO2,  SpO2, BP, heart rate and rhythm monitor placed and checked for adequate function Safety Precautions: Patient was assessed for positional comfort and pressure points before starting the procedure. Time-out: I initiated and conducted the "Time-out" before starting the procedure, as per protocol. The patient was asked to participate by  confirming the accuracy of the "Time Out" information. Verification of the correct person, site, and procedure were performed and confirmed by me, the nursing staff, and the patient. "Time-out" conducted as per Joint Commission's Universal Protocol (UP.01.01.01). Time: 1034  Description of Procedure:          Laterality: Bilateral. The procedure was performed in identical fashion on both sides. Targeted Levels: L2, L3, L4, L5, and S1  Medial Branch Level(s)  Safety Precautions: Aspiration looking for blood return was conducted prior to all injections. At no point did we inject any substances, as a needle was being advanced. Before injecting, the patient was told to immediately notify me if she was experiencing any new onset of "ringing in the ears, or metallic taste in the mouth". No attempts were made at seeking any paresthesias. Safe injection practices and needle disposal techniques used. Medications properly checked for expiration dates. SDV (single dose vial) medications used. After the completion of the procedure, all disposable equipment used was discarded in the proper designated medical waste containers. Local Anesthesia: Protocol guidelines were followed. The patient was positioned over the fluoroscopy table. The area was prepped in the usual manner. The time-out was completed. The target area was identified using fluoroscopy. A 12-in long, straight, sterile hemostat was used with fluoroscopic guidance to locate the targets for each level blocked. Once located, the skin was marked with an approved surgical skin marker. Once all sites were marked, the skin (epidermis,  dermis, and hypodermis), as well as deeper tissues (fat, connective tissue and muscle) were infiltrated with a small amount of a short-acting local anesthetic, loaded on a 10cc syringe with a 25G, 1.5-in  Needle. An appropriate amount of time was allowed for local anesthetics to take effect before proceeding to the next step. Local Anesthetic: Lidocaine 2.0% The unused portion of the local anesthetic was discarded in the proper designated containers. Technical description of process:  L2 Medial Branch Nerve Block (MBB): The target area for the L2 medial branch is at the junction of the postero-lateral aspect of the superior articular process and the superior, posterior, and medial edge of the transverse process of L3. Under fluoroscopic guidance, a Quincke needle was inserted until contact was made with os over the superior postero-lateral aspect of the pedicular shadow (target area). After negative aspiration for blood, 0.5 mL of the nerve block solution was injected without difficulty or complication. The needle was removed intact. L3 Medial Branch Nerve Block (MBB): The target area for the L3 medial branch is at the junction of the postero-lateral aspect of the superior articular process and the superior, posterior, and medial edge of the transverse process of L4. Under fluoroscopic guidance, a Quincke needle was inserted until contact was made with os over the superior postero-lateral aspect of the pedicular shadow (target area). After negative aspiration for blood, 0.5 mL of the nerve block solution was injected without difficulty or complication. The needle was removed intact. L4 Medial Branch Nerve Block (MBB): The target area for the L4 medial branch is at the junction of the postero-lateral aspect of the superior articular process and the superior, posterior, and medial edge of the transverse process of L5. Under fluoroscopic guidance, a Quincke needle was inserted until contact was made with os over  the superior postero-lateral aspect of the pedicular shadow (target area). After negative aspiration for blood, 0.5 mL of the nerve block solution was injected without difficulty or complication. The needle was removed intact. L5 Medial Branch Nerve Block (MBB):  The target area for the L5 medial branch is at the junction of the postero-lateral aspect of the superior articular process and the superior, posterior, and medial edge of the sacral ala. Under fluoroscopic guidance, a Quincke needle was inserted until contact was made with os over the superior postero-lateral aspect of the pedicular shadow (target area). After negative aspiration for blood, 0.5 mL of the nerve block solution was injected without difficulty or complication. The needle was removed intact. S1 Medial Branch Nerve Block (MBB): The target area for the S1 medial branch is at the posterior and inferior 6 o'clock position of the L5-S1 facet joint. Under fluoroscopic guidance, the Quincke needle inserted for the L5 MBB was redirected until contact was made with os over the inferior and postero aspect of the sacrum, at the 6 o' clock position under the L5-S1 facet joint (Target area). After negative aspiration for blood, 0.5 mL of the nerve block solution was injected without difficulty or complication. The needle was removed intact.  Once the entire procedure was completed, the treated area was cleaned, making sure to leave some of the prepping solution back to take advantage of its long term bactericidal properties.         Illustration of the posterior view of the lumbar spine and the posterior neural structures. Laminae of L2 through S1 are labeled. DPRL5, dorsal primary ramus of L5; DPRS1, dorsal primary ramus of S1; DPR3, dorsal primary ramus of L3; FJ, facet (zygapophyseal) joint L3-L4; I, inferior articular process of L4; LB1, lateral branch of dorsal primary ramus of L1; IAB, inferior articular branches from L3 medial branch  (supplies L4-L5 facet joint); IBP, intermediate branch plexus; MB3, medial branch of dorsal primary ramus of L3; NR3, third lumbar nerve root; S, superior articular process of L5; SAB, superior articular branches from L4 (supplies L4-5 facet joint also); TP3, transverse process of L3.  Vitals:   06/15/22 1035 06/15/22 1040 06/15/22 1044 06/15/22 1049  BP: (!) 155/96  (!) 160/95 (!) 166/94  Pulse: 81 83 80   Resp: (!) 22 (!) 21 (!) 21 16  Temp:      SpO2: 95% 96% 96% 97%  Weight:      Height:         Start Time: 1034 hrs. End Time: 1041 hrs.  Imaging Guidance (Spinal):          Type of Imaging Technique: Fluoroscopy Guidance (Spinal) Indication(s): Assistance in needle guidance and placement for procedures requiring needle placement in or near specific anatomical locations not easily accessible without such assistance. Exposure Time: Please see nurses notes. Contrast: None used. Fluoroscopic Guidance: I was personally present during the use of fluoroscopy. "Tunnel Vision Technique" used to obtain the best possible view of the target area. Parallax error corrected before commencing the procedure. "Direction-depth-direction" technique used to introduce the needle under continuous pulsed fluoroscopy. Once target was reached, antero-posterior, oblique, and lateral fluoroscopic projection used confirm needle placement in all planes. Images permanently stored in EMR.  Interpretation: No contrast injected. I personally interpreted the imaging intraoperatively. Adequate needle placement confirmed in multiple planes. Permanent images saved into the patient's record.  Today the patient was informed the fact that there is significant osteopenia/osteoporosis in the lumbosacral area.  Between the bone thinning and the patient was more reviewed obesity it makes it rather difficult to clearly identify the landmarks.  To add to this is the fact that she has significant degenerative disc disease and  degenerative joint disease of the lumbar  spine.  The patient was instructed to make sure that she continues to prophylactically treat her osteoporosis since this can lead to pathological fractures.  The patient was also instructed again to make sure to bring her BMI below 30.  Post-operative Assessment:  Post-procedure Vital Signs:  Pulse/HCG Rate: 8084 Temp: (!) 97.3 F (36.3 C) Resp: 16 BP: (!) 166/94 SpO2: 97 %  EBL: None  Complications: No immediate post-treatment complications observed by team, or reported by patient.  Note: The patient tolerated the entire procedure well. A repeat set of vitals were taken after the procedure and the patient was kept under observation following institutional policy, for this type of procedure. Post-procedural neurological assessment was performed, showing return to baseline, prior to discharge. The patient was provided with post-procedure discharge instructions, including a section on how to identify potential problems. Should any problems arise concerning this procedure, the patient was given instructions to immediately contact us, at any time, without hesitation. In any case, we plan to contact the patient by telephone for a follow-up status report regarding this interventional procedure.  Comments:  No additional relevant information.  Plan of Care  Orders:  Orders Placed This Encounter  Procedures   LUMBAR FACET(MEDIAL BRANCH NERVE BLOCK) MBNB    Scheduling Instructions:     Procedure: Lumbar facet block (AKA.: Lumbosacral medial branch nerve block)     Side: Bilateral     Level: L3-4, L4-5, and L5-S1 Facets (L2, L3, L4, L5, and S1 Medial Branch Nerves)     Sedation: Patient's choice.     Timeframe: Today    Order Specific Question:   Where will this procedure be performed?    Answer:   [name]   DG [facility] C-ARM 1-60 MIN NO REPORT    Intraoperative interpretation by procedural physician at [facility].     Standing Status:   Standing    Number of Occurrences:   1    Order Specific Question:   Reason for exam:    Answer:   Assistance in needle guidance and placement for procedures requiring needle placement in or near specific anatomical locations not easily accessible without such assistance.   Informed Consent Details: Physician/Practitioner Attestation; Transcribe to consent form and obtain patient signature    Nursing Order: Transcribe to consent form and obtain patient signature. Note: Always confirm laterality of pain with Ms. Gagliardo, before procedure.    Order Specific Question:   Physician/Practitioner attestation of informed consent for procedure/surgical case    Answer:   I, the physician/practitioner, attest that I have discussed with the patient the benefits, risks, side effects, alternatives, likelihood of achieving goals and potential problems during recovery for the procedure that I have provided informed consent.    Order Specific Question:   Procedure    Answer:   Lumbar Facet Block  under fluoroscopic guidance    Order Specific Question:   Physician/Practitioner performing the procedure    Answer:   Lexa Coronado A. Laban Emperor MD    Order Specific Question:   Indication/Reason    Answer:   Low Back Pain, with our without leg pain, due to Facet Joint Arthralgia (Joint Pain) Spondylosis (Arthritis of the Spine), without myelopathy or radiculopathy (Nerve Damage).   Provide equipment / supplies at bedside    Procedure tray: "Block Tray" (Disposable  single use) Skin infiltration needle: Regular 1.5-in, 25-G, (x1) Block Needle type: Spinal Amount/quantity: 4 Size: Medium (5-inch) Gauge: 22G    Standing Status:   Standing  Number of Occurrences:   1    Order Specific Question:   Specify    Answer:   Block Tray   Care order/instruction: Please confirm that the patient has stopped the Coumadin (Warfarin) X 5 days prior to procedure or surgery.    Please confirm that the patient has  stopped the Coumadin (Warfarin) X 5 days prior to procedure or surgery.    Standing Status:   Standing    Number of Occurrences:   1   Bleeding precautions    Standing Status:   Standing    Number of Occurrences:   1   Latex precautions    Activate Latex-Free Protocol.    Standing Status:   Standing    Number of Occurrences:   1   Chronic Opioid Analgesic:  Oxycodone IR 10 mg, 1 tab PO qd,  from PCP. No chronic opioid analgesics therapy prescribed by our practice, 2ry to Medication Agreement Violation. (Getting oxycodone from PCP in multiple occasions, according to PMP)(11/21/20; 12/26/20; 01/16/21; 02/13/21). Opioid analgesic pharmacotherapy D/C'ed on 03/03/2021. MME: 20 mg/day.   Medications ordered for procedure: Meds ordered this encounter  Medications   lidocaine (XYLOCAINE) 2 % (with pres) injection 400 mg   pentafluoroprop-tetrafluoroeth (GEBAUERS) aerosol   lactated ringers infusion   midazolam (VERSED) injection 0.5-2 mg    Make sure Flumazenil is available in the pyxis when using this medication. If oversedation occurs, administer 0.2 mg IV over 15 sec. If after 45 sec no response, administer 0.2 mg again over 1 min; may repeat at 1 min intervals; not to exceed 4 doses (1 mg)   ropivacaine (PF) 2 mg/mL (0.2%) (NAROPIN) injection 18 mL   triamcinolone acetonide (KENALOG-40) injection 80 mg   Medications administered: We administered lidocaine, pentafluoroprop-tetrafluoroeth, lactated ringers, midazolam, ropivacaine (PF) 2 mg/mL (0.2%), and triamcinolone acetonide.  See the medical record for exact dosing, route, and time of administration.  Follow-up plan:   Return in about 2 weeks (around 06/29/2022) for Proc-day (T,Th), (F2F), (PPE).        Interventional Therapies  Risk  Complexity Considerations:   ANTICOAGULATION: Coumadin (Stop: 5 days  Re-start: 2hrs)  Hx DVT & PE ALLERGY: Latex Morbid obesity (class III) (BMI>40)  metabolic syndrome  NIDDM HTN No Lumbar RFA due to  morbid obesity    Planned  Pending:   Palliative bilateral lumbar facet MBB [id number] (1st of 2024) (06/15/2022) (10-9/10)   Under consideration:      Completed:   Therapeutic bilateral lumbar facet MBB (L3-S1) x1 (04/06/2022) (80/80/90/90)  Diagnostic/therapeutic right IA hip inj. x2 (01/28/2022) (100/100/85/85)  Diagnostic/therapeutic left IA hip inj. x1 (01/28/2022) (100/100/85/85)  Diagnostic/therapeutic right trochanteric bursa inj. x1 (09/15/2021) (85/85/85)  Diagnostic/therapeutic left trochanteric bursa inj. x1 (01/28/2022) (100/100/85/85)  Therapeutic left lumbar facet RFA x1 (07/21/2021) (100/100/100 x1 week/80)  Therapeutic right lumbar facet RFA x1 (07/07/2021) (100/100/80/80)  Palliative/Therapeutic left L4-5 LESI x1 (11/10/2017) (100/80/0/0-25)  Diagnostic/therapeutic right L4 TFESI x1 (11/19/2021) (90/80/0/0) (fell at [employer]) Palliative left lumbar facet MBB x17 (05/05/2021) (100/100/75/85)  Palliative right lumbar facet MBB x23 (05/05/2021) (100/100/75/85)  Palliative right SI joint block x9 (01/28/2022) (100/100/80/80)  Diagnostic left SI joint block x2 (01/28/2022) (100/100/80/80)  Palliative/Therapeutic (Midline) caudal ESI x3 (06/08/2018) (100/100/100 x 2-3 days/0)    Therapeutic  Palliative (PRN) options:   NO PRN procedures without F2F exam by Dr. Laban Emperor due to multifactorial etiology of chronic pain.   Pharmacotherapy  Nonopioids transferred 03/27/2020: Calcium      Recent Visits Date Type Provider  Dept  04/22/22 Office Visit Delano Metz, MD [name]  04/06/22 Procedure visit Delano Metz, MD [name]  03/18/22 Office Visit Delano Metz, MD [name]  Showing recent visits within past 90 days and meeting all other requirements Today's Visits Date Type Provider Dept  06/15/22 Procedure visit Delano Metz, MD [name]  Showing today's visits and meeting all other requirements Future  Appointments Date Type Provider Dept  06/29/22 Appointment Delano Metz, MD [name]  Showing future appointments within next 90 days and meeting all other requirements  Disposition: Discharge home  Discharge (Date  Time): 06/15/2022; 1053 hrs.   Primary Care Physician: Barbette Reichmann, MD Location: [facility] Note by: Oswaldo Done, MD Date: 06/15/2022; Time: 11:17 AM  Disclaimer:  Medicine is not an Visual merchandiser. The only guarantee in medicine is that nothing is guaranteed. It is important to note that the decision to proceed with this intervention was based on the information collected from the patient. The Data and conclusions were drawn from the patient's questionnaire, the interview, and the physical examination. Because the information was provided in large part by the patient, it cannot be guaranteed that it has not been purposely or unconsciously manipulated. Every effort has been made to obtain as much relevant data as possible for this evaluation. It is important to note that the conclusions that lead to this procedure are derived in large part from the available data. Always take into account that the treatment will also be dependent on availability of resources and existing treatment guidelines, considered by other Pain Management Practitioners as being common knowledge and practice, at the time of the intervention. For Medico-Legal purposes, it is also important to point out that variation in procedural techniques and pharmacological choices are the acceptable norm. The indications, contraindications, technique, and results of the above procedure should only be interpreted and judged by a Board-Certified Interventional Pain Specialist with extensive familiarity and expertise in the same exact procedure and technique.

## 2022-06-15 ENCOUNTER — Ambulatory Visit: Payer: PPO | Attending: Pain Medicine | Admitting: Pain Medicine

## 2022-06-15 ENCOUNTER — Encounter: Payer: Self-pay | Admitting: Pain Medicine

## 2022-06-15 ENCOUNTER — Ambulatory Visit
Admission: RE | Admit: 2022-06-15 | Discharge: 2022-06-15 | Disposition: A | Discharge status: Home / Self Care | Payer: PPO | Source: Ambulatory Visit | Attending: Pain Medicine | Admitting: Pain Medicine

## 2022-06-15 ENCOUNTER — Telehealth: Payer: Self-pay

## 2022-06-15 VITALS — BP 166/94 | HR 80 | Temp 97.3°F | Resp 16 | Ht 63.0 in | Wt 201.0 lb

## 2022-06-15 DIAGNOSIS — M47816 Spondylosis without myelopathy or radiculopathy, lumbar region: Secondary | ICD-10-CM | POA: Insufficient documentation

## 2022-06-15 DIAGNOSIS — Z6841 Body Mass Index (BMI) 40.0 and over, adult: Secondary | ICD-10-CM | POA: Insufficient documentation

## 2022-06-15 DIAGNOSIS — G8929 Other chronic pain: Secondary | ICD-10-CM | POA: Diagnosis not present

## 2022-06-15 DIAGNOSIS — Z91040 Latex allergy status: Secondary | ICD-10-CM | POA: Insufficient documentation

## 2022-06-15 DIAGNOSIS — M4306 Spondylolysis, lumbar region: Secondary | ICD-10-CM | POA: Diagnosis not present

## 2022-06-15 DIAGNOSIS — M47817 Spondylosis without myelopathy or radiculopathy, lumbosacral region: Secondary | ICD-10-CM | POA: Insufficient documentation

## 2022-06-15 DIAGNOSIS — E65 Localized adiposity: Secondary | ICD-10-CM | POA: Diagnosis not present

## 2022-06-15 DIAGNOSIS — M545 Low back pain, unspecified: Secondary | ICD-10-CM | POA: Insufficient documentation

## 2022-06-15 DIAGNOSIS — Z7901 Long term (current) use of anticoagulants: Secondary | ICD-10-CM | POA: Insufficient documentation

## 2022-06-15 DIAGNOSIS — R937 Abnormal findings on diagnostic imaging of other parts of musculoskeletal system: Secondary | ICD-10-CM | POA: Diagnosis not present

## 2022-06-15 DIAGNOSIS — M5137 Other intervertebral disc degeneration, lumbosacral region: Secondary | ICD-10-CM | POA: Insufficient documentation

## 2022-06-15 DIAGNOSIS — M4310 Spondylolisthesis, site unspecified: Secondary | ICD-10-CM | POA: Insufficient documentation

## 2022-06-15 MED ORDER — LIDOCAINE HCL 2 % IJ SOLN
INTRAMUSCULAR | Status: AC
  Filled 2022-06-15: qty 20

## 2022-06-15 MED ORDER — ROPIVACAINE HCL 2 MG/ML IJ SOLN
18.0000 mL | Freq: Once | INTRAMUSCULAR | Status: AC
  Administered 2022-06-15: 18 mL via PERINEURAL

## 2022-06-15 MED ORDER — TRIAMCINOLONE ACETONIDE 40 MG/ML IJ SUSP
INTRAMUSCULAR | Status: AC
  Filled 2022-06-15: qty 2

## 2022-06-15 MED ORDER — MIDAZOLAM HCL 2 MG/2ML IJ SOLN
0.5000 mg | Freq: Once | INTRAMUSCULAR | Status: AC
  Administered 2022-06-15: 2 mg via INTRAVENOUS

## 2022-06-15 MED ORDER — ROPIVACAINE HCL 2 MG/ML IJ SOLN
INTRAMUSCULAR | Status: AC
  Filled 2022-06-15: qty 20

## 2022-06-15 MED ORDER — LACTATED RINGERS IV SOLN: Freq: Once | INTRAVENOUS | Status: AC

## 2022-06-15 MED ORDER — TRIAMCINOLONE ACETONIDE 40 MG/ML IJ SUSP
80.0000 mg | Freq: Once | INTRAMUSCULAR | Status: AC
  Administered 2022-06-15: 80 mg

## 2022-06-15 MED ORDER — PENTAFLUOROPROP-TETRAFLUOROETH EX AERO
INHALATION_SPRAY | Freq: Once | CUTANEOUS | Status: AC
  Administered 2022-06-15: 30 via TOPICAL
  Filled 2022-06-15: qty 116

## 2022-06-15 MED ORDER — LIDOCAINE HCL 2 % IJ SOLN
20.0000 mL | Freq: Once | INTRAMUSCULAR | Status: AC
  Administered 2022-06-15: 400 mg

## 2022-06-15 MED ORDER — MIDAZOLAM HCL 2 MG/2ML IJ SOLN
INTRAMUSCULAR | Status: AC
  Filled 2022-06-15: qty 2

## 2022-06-15 NOTE — Telephone Encounter (Signed)
My chart message sent re elevators

## 2022-06-15 NOTE — Patient Instructions (Signed)
____________________________________________________________________________________________  Post-Procedure Discharge Instructions  Instructions:  Apply ice:   Purpose: This will minimize any swelling and discomfort after procedure.   When: Day of procedure, as soon as you get home.  How: Fill a plastic sandwich bag with crushed ice. Cover it with a small towel and apply to injection site.  How long: (15 min on, 15 min off) Apply for 15 minutes then remove x 15 minutes.  Repeat sequence on day of procedure, until you go to bed.  Apply heat:   Purpose: To treat any soreness and discomfort from the procedure.  When: Starting the next day after the procedure.  How: Apply heat to procedure site starting the day following the procedure.  How long: May continue to repeat daily, until discomfort goes away.  Food intake: Start with clear liquids (like water) and advance to regular food, as tolerated.   Physical activities: Keep activities to a minimum for the first 8 hours after the procedure. After that, then as tolerated.  Driving: If you have received any sedation, be responsible and do not drive. You are not allowed to drive for 24 hours after having sedation.  Blood thinner: (Applies only to those taking blood thinners) You may restart your blood thinner 6 hours after your procedure.  Insulin: (Applies only to Diabetic patients taking insulin) As soon as you can eat, you may resume your normal dosing schedule.  Infection prevention: Keep procedure site clean and dry. Shower daily and clean area with soap and water.  Post-procedure Pain Diary: Extremely important that this be done correctly and accurately. Recorded information will be used to determine the next step in treatment. For the purpose of accuracy, follow these rules:  Evaluate only the area treated. Do not report or include pain from an untreated area. For the purpose of this evaluation, ignore all other areas of pain,  except for the treated area.  After your procedure, avoid taking a long nap and attempting to complete the pain diary after you wake up. Instead, set your alarm clock to go off every hour, on the hour, for the initial 8 hours after the procedure. Document the duration of the numbing medicine, and the relief you are getting from it.  Do not go to sleep and attempt to complete it later. It will not be accurate. If you received sedation, it is likely that you were given a medication that may cause amnesia. Because of this, completing the diary at a later time may cause the information to be inaccurate. This information is needed to plan your care.  Follow-up appointment: Keep your post-procedure follow-up evaluation appointment after the procedure (usually 2 weeks for most procedures, 6 weeks for radiofrequencies). DO NOT FORGET to bring you pain diary with you.   Expect: (What should I expect to see with my procedure?)  From numbing medicine (AKA: Local Anesthetics): Numbness or decrease in pain. You may also experience some weakness, which if present, could last for the duration of the local anesthetic.  Onset: Full effect within 15 minutes of injected.  Duration: It will depend on the type of local anesthetic used. On the average, 1 to 8 hours.   From steroids (Applies only if steroids were used): Decrease in swelling or inflammation. Once inflammation is improved, relief of the pain will follow.  Onset of benefits: Depends on the amount of swelling present. The more swelling, the longer it will take for the benefits to be seen. In some cases, up to 10 days.    may typically last 2 weeks (the duration of the steroids). Frequent: Cramps (if they occur, drink Gatorade and take over-the-counter Magnesium 450-500 mg once to twice a day); water retention with temporary  weight gain; increases in blood sugar; decreased immune system response; increased appetite. Occasional: Facial flushing (red, warm cheeks); mood swings; menstrual changes. Uncommon: Long-term decrease or suppression of natural hormones; bone thinning. (These are more common with higher doses or more frequent use. This is why we prefer that our patients avoid having any injection therapies in other practices.)  Very Rare: Severe mood changes; psychosis; aseptic necrosis. From procedure: Some discomfort is to be expected once the numbing medicine wears off. This should be minimal if ice and heat are applied as instructed.  Call if: (When should I call?) You experience numbness and weakness that gets worse with time, as opposed to wearing off. New onset bowel or bladder incontinence. (Applies only to procedures done in the spine)  Emergency Numbers: Durning business hours (Monday - Thursday, 8:00 AM - 4:00 PM) (Friday, 9:00 AM - 12:00 Noon): [phone] After hours: [phone] NOTE: If you are having a problem and are unable connect with, or to talk to a provider, then go to your nearest urgent care or emergency department. If the problem is serious and urgent, please call 911. ____________________________________________________________________________________________  ____________________________________________________________________________________________  Patient Information update  To: All of our patients.  Re: Name change.  It has been made official that our current name, "[facility]"   will soon be changed to "[facility]".   The purpose of this change is to eliminate any confusion created by the concept of our practice being a "Medication Management Pain Clinic". In the past this has led to the misconception that we treat pain primarily by the use of prescription  medications.  Nothing can be farther from the truth.   Understanding PAIN MANAGEMENT: To further understand what our practice does, you first have to understand that "Pain Management" is a subspecialty that requires additional training once a physician has completed their specialty training, which can be in either Anesthesia, Neurology, Psychiatry, or Physical Medicine and Rehabilitation (PMR). Each one of these contributes to the final approach taken by each physician to the management of their patient's pain. To be a "Pain Management Specialist" you must have first completed one of the specialty trainings below.  Anesthesiologists - trained in clinical pharmacology and interventional techniques such as nerve blockade and regional as well as central neuroanatomy. They are trained to block pain before, during, and after surgical interventions.  Neurologists - trained in the diagnosis and pharmacological treatment of complex neurological conditions, such as Multiple Sclerosis, Parkinson's, spinal cord injuries, and other systemic conditions that may be associated with symptoms that may include but are not limited to pain. They tend to rely primarily on the treatment of chronic pain using prescription medications.  Psychiatrist - trained in conditions affecting the psychosocial wellbeing of patients including but not limited to depression, anxiety, schizophrenia, personality disorders, addiction, and other substance use disorders that may be associated with chronic pain. They tend to rely primarily on the treatment of chronic pain using prescription medications.   Physical Medicine and Rehabilitation (PMR) physicians, also known as physiatrists - trained to treat a wide variety of medical conditions affecting the brain, spinal cord, nerves, bones, joints, ligaments, muscles, and tendons. Their training is primarily aimed at treating patients that have suffered injuries   that have caused severe physical  impairment. Their training is primarily aimed at the physical therapy and rehabilitation of those patients. They may also work alongside orthopedic surgeons or neurosurgeons using their expertise in assisting surgical patients to recover after their surgeries.  INTERVENTIONAL PAIN MANAGEMENT is sub-subspecialty of Pain Management.  Our physicians are Board-certified in Anesthesia, Pain Management, and Interventional Pain Management.  This meaning that not only have they been trained and Board-certified in their specialty of Anesthesia, and subspecialty of Pain Management, but they have also received further training in the sub-subspecialty of Interventional Pain Management, in order to become Board-certified as INTERVENTIONAL PAIN MANAGEMENT SPECIALIST.    Mission: Our goal is to use our skills in  INTERVENTIONAL PAIN MANAGEMENT as alternatives to the chronic use of prescription opioid medications for the treatment of pain. To make this more clear, we have changed our name to reflect what we do and offer. We will continue to offer medication management assessment and recommendations, but we will not be taking over any patient's medication management.  ____________________________________________________________________________________________   

## 2022-06-15 NOTE — Progress Notes (Signed)
Safety precautions to be maintained throughout the outpatient stay will include: orient to surroundings, keep bed in low position, maintain call bell within reach at all times, provide assistance with transfer out of bed and ambulation.  

## 2022-06-16 ENCOUNTER — Telehealth: Payer: Self-pay

## 2022-06-16 NOTE — Telephone Encounter (Signed)
Called PP, No answer, left message on AM. To call if needed.

## 2022-06-18 DIAGNOSIS — R791 Abnormal coagulation profile: Secondary | ICD-10-CM | POA: Diagnosis not present

## 2022-06-21 ENCOUNTER — Ambulatory Visit
Admission: RE | Admit: 2022-06-21 | Discharge: 2022-06-21 | Disposition: A | Discharge status: Home / Self Care | Payer: PPO | Source: Ambulatory Visit | Attending: Oncology | Admitting: Oncology

## 2022-06-21 DIAGNOSIS — C3411 Malignant neoplasm of upper lobe, right bronchus or lung: Secondary | ICD-10-CM | POA: Insufficient documentation

## 2022-06-21 DIAGNOSIS — J479 Bronchiectasis, uncomplicated: Secondary | ICD-10-CM | POA: Diagnosis not present

## 2022-06-21 DIAGNOSIS — C3490 Malignant neoplasm of unspecified part of unspecified bronchus or lung: Secondary | ICD-10-CM | POA: Diagnosis not present

## 2022-06-21 LAB — POCT I-STAT CREATININE: Creatinine, Ser: 0.7 mg/dL (ref 0.44–1.00)

## 2022-06-21 MED ORDER — IOHEXOL 300 MG/ML  SOLN
100.0000 mL | Freq: Once | INTRAMUSCULAR | Status: AC | PRN
  Administered 2022-06-21: 100 mL via INTRAVENOUS

## 2022-06-23 ENCOUNTER — Encounter: Payer: Self-pay | Admitting: Oncology

## 2022-06-23 ENCOUNTER — Inpatient Hospital Stay: Payer: PPO | Attending: Oncology | Admitting: Oncology

## 2022-06-23 VITALS — BP 150/78 | HR 75 | Resp 18 | Ht 63.0 in | Wt 207.0 lb

## 2022-06-23 DIAGNOSIS — Z85118 Personal history of other malignant neoplasm of bronchus and lung: Secondary | ICD-10-CM | POA: Diagnosis not present

## 2022-06-23 DIAGNOSIS — C3411 Malignant neoplasm of upper lobe, right bronchus or lung: Secondary | ICD-10-CM | POA: Insufficient documentation

## 2022-06-23 DIAGNOSIS — R5383 Other fatigue: Secondary | ICD-10-CM | POA: Diagnosis not present

## 2022-06-23 DIAGNOSIS — Z08 Encounter for follow-up examination after completed treatment for malignant neoplasm: Secondary | ICD-10-CM

## 2022-06-23 NOTE — Progress Notes (Signed)
Hematology/Oncology Consult note [facility]  Telephone:([phone] [phone]  Patient Care Team: Tracie Harrier, MD as PCP - General (Internal Medicine) Telford Nab, RN as Oncology Nurse Navigator Sindy Guadeloupe, MD as Consulting Physician (Oncology)   Name of the patient: Kara [name]  [id number]  11/07/1950   Date of visit: 06/23/22  Diagnosis-right upper lobe lung cancer s/p SBRT  Chief complaint/ Reason for visit-routine follow-up of lung cancer  Heme/Onc history: Patient is a 72 year old female underwent CT chest with contrast in May 2022 to follow-up on lung nodule that was incidentally noted on CT abdomen in March 2022 when she was admitted for [facility].  At that time she was noted to have a right lower lobe 1.3 x 1.1 cm right lower lobe nodule.  CT chest in May 2022 showed right upper lobe nodule measuring 1.4 cm which was also hypermetabolic on PET scan with an SUV of 3.3.  No other enlarged mediastinal or hilar lymph nodes or distant metastatic disease was noted.   Repeat CT scan in August 2022 showed mild increase in the size of the right upper lobe lung nodule to 1.8 cm.  No evidence of locoregional adenopathy.    Interval history-she is doing well overall.  She is doing a part-time job and denies any significant exertional shortness of breath.  She has some baseline fatigue  ECOG PS- 1 Pain scale- 0     Review of systems- Review of Systems  Constitutional:  Positive for malaise/fatigue. Negative for chills, fever and weight loss.  HENT:  Negative for congestion, ear discharge and nosebleeds.   Eyes:  Negative for blurred vision.  Respiratory:  Negative for cough, hemoptysis, sputum production, shortness of breath and wheezing.   Cardiovascular:  Negative for chest pain, palpitations, orthopnea and claudication.  Gastrointestinal:  Negative for abdominal pain, blood in stool, constipation, diarrhea, heartburn, melena, nausea  and vomiting.  Genitourinary:  Negative for dysuria, flank pain, frequency, hematuria and urgency.  Musculoskeletal:  Negative for back pain, joint pain and myalgias.  Skin:  Negative for rash.  Neurological:  Negative for dizziness, tingling, focal weakness, seizures, weakness and headaches.  Endo/Heme/Allergies:  Does not bruise/bleed easily.  Psychiatric/Behavioral:  Negative for depression and suicidal ideas. The patient does not have insomnia.       Allergies  Allergen Reactions   Adhesive [Tape] Other (See Comments)    Pt reports, when removing tape from skin most tapes pull her skin off. 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, 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   CHF (congestive heart failure) ([place])    followed colon resection, "wouldn't let me get up"   Diabetes ([place])    type 2   Diverticulitis 2018   History of being hospitalized    1 month ago for 3 days, vomiting, and kink in upper intestine   History of hiatal hernia    Hypertension    Osteoporosis    PE (pulmonary thromboembolism) ([place])    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[place])    Vertigo      Past Surgical History:  Procedure Laterality  Date   APPENDECTOMY N/A 02/24/2017   Procedure: Incidental  APPENDECTOMY;  Surgeon: [name], MD;  Location: [name];  Service: General;  Laterality: N/A;   CARPAL TUNNEL RELEASE Bilateral    CATARACT EXTRACTION W/PHACO Left 11/21/2018   Procedure: CATARACT EXTRACTION PHACO AND INTRAOCULAR LENS PLACEMENT ([place]) LEFT DIABETES;  Surgeon: Birder Robson, MD;  Location: [facility];  Service: Ophthalmology;  Laterality: Left;  latex sensitivity Diabetic - oral meds   CATARACT EXTRACTION  W/PHACO Right 12/12/2018   Procedure: CATARACT EXTRACTION PHACO AND INTRAOCULAR LENS PLACEMENT ([place]) RIGHT DIABETES;  Surgeon: Birder Robson, MD;  Location: [facility];  Service: Ophthalmology;  Laterality: Right;  Diabetes-oral med Latex sensitiviy   COLON RESECTION SIGMOID N/A 11/02/2016   Procedure: COLON RESECTION SIGMOID;  Surgeon: [name], MD;  Location: [name];  Service: General;  Laterality: N/A;   COLON SURGERY  11/02/2016   COLONOSCOPY WITH PROPOFOL N/A 02/03/2017   Procedure: COLONOSCOPY WITH PROPOFOL;  Surgeon: Lin Landsman, MD;  Location: [facility];  Service: Gastroenterology;  Laterality: N/A;   COLOSTOMY N/A 11/02/2016   Procedure: COLOSTOMY;  Surgeon: [name], MD;  Location: [name];  Service: General;  Laterality: N/A;   COLOSTOMY REVERSAL N/A 02/24/2017   Procedure: COLOSTOMY REVERSAL, ostomy takedown, spleenic flexure mobilization, excision rectal stump/distal sigmoid, anastomosis with suture reinforcement;  Surgeon: [name], MD;  Location: [name];  Service: General;  Laterality: N/A;   ILEO LOOP DIVERSION N/A 02/24/2017   Procedure: ILEO LOOP COLOSTOMY;  Surgeon: [name], MD;  Location: [name];  Service: General;  Laterality: N/A;   ILEOSTOMY CLOSURE N/A 06/01/2017   Procedure: LOOP ILEOSTOMY TAKEDOWN;  Surgeon: [name], MD;  Location: [name];  Service: General;  Laterality: N/A;   IVC FILTER INSERTION Right 10/2016   IVC FILTER REMOVAL N/A 08/09/2017   Procedure: IVC FILTER REMOVAL;  Surgeon: Katha Cabal, MD;  Location: [facility];  Service: Cardiovascular;  Laterality: N/A;   KNEE SURGERY     torn menicus   LAPAROSCOPY N/A 02/24/2017   Procedure: LAPAROSCOPY DIAGNOSTIC;  Surgeon: [name], MD;  Location: [name];  Service: General;  Laterality: N/A;   LYSIS OF ADHESION N/A 02/24/2017   Procedure: LYSIS OF ADHESION;  Surgeon: [name], MD;  Location: [name];  Service:  General;  Laterality: N/A;   PULMONARY VENOGRAPHY N/A 11/19/2016   Procedure: Pulmonary Venography; IVC filter placement; possible pulmonary thrombectomy;  Surgeon: Katha Cabal, MD;  Location: [facility];  Service: Cardiovascular;  Laterality: N/A;   SACROPLASTY N/A 11/08/2017   Procedure: SACROPLASTY S2;  Surgeon: Hessie Knows, MD;  Location: [name];  Service: Orthopedics;  Laterality: N/A;   SIGMOIDOSCOPY N/A 11/13/2016   Procedure: endoscopic  flexible SIGMOIDOSCOPY;  Surgeon: Florene Glen, MD;  Location: [name];  Service: General;  Laterality: N/A;   SIGMOIDOSCOPY N/A 05/19/2017   Procedure: Beryle Quant;  Surgeon: [name], MD;  Location: [name];  Service: General;  Laterality: N/A;   VIDEO BRONCHOSCOPY WITH ENDOBRONCHIAL NAVIGATION N/A 02/23/2021   Procedure: ROBOTIC VIDEO BRONCHOSCOPY WITH ENDOBRONCHIAL NAVIGATION;  Surgeon: Tyler Pita, MD;  Location: [name];  Service: Pulmonary;  Laterality: N/A;    Social History   Socioeconomic History   Marital status: Married    Spouse name: Not on file   Number of children: Not on file   Years of education: Not on file   Highest education level: Not on file  Occupational History  Not on file  Tobacco Use   Smoking status: Never   Smokeless tobacco: Never  Vaping Use   Vaping Use: Never used  Substance and Sexual Activity   Alcohol use: No    Alcohol/week: 0.0 standard drinks of alcohol   Drug use: No   Sexual activity: Never  Other Topics Concern   Not on file  Social History Narrative   Not on file   Social Determinants of Health   Financial Resource Strain: Not on file  Food Insecurity: Not on file  Transportation Needs: Not on file  Physical Activity: Not on file  Stress: Not on file  Social Connections: Not on file  Intimate Partner Violence: Not on file    Family History  Problem Relation Age of Onset   Diabetes Mother    Heart disease Mother    Cancer Mother     Breast cancer Mother        >50   Heart disease Father    Diabetes Sister    Heart disease Sister      Current Outpatient Medications:    alendronate (FOSAMAX) 70 MG tablet, Take 70 mg by mouth every Friday. Take with a full glass of water on an empty stomach., Disp: , Rfl:    candesartan (ATACAND) 16 MG tablet, Take 16 mg by mouth in the morning., Disp: , Rfl:    cholecalciferol (VITAMIN D) 25 MCG (1000 UNIT) tablet, Take 1,000 Units by mouth in the morning., Disp: , Rfl:    Cyanocobalamin (VITAMIN B-12) 5000 MCG TBDP, Take 5,000 mcg by mouth in the morning., Disp: , Rfl:    cyclobenzaprine (FLEXERIL) 10 MG tablet, Take 10 mg by mouth at bedtime., Disp: , Rfl:    Dulaglutide 1.5 MG/0.5ML SOPN, Inject 1.5 mg into the skin every Saturday., Disp: , Rfl:    gabapentin (NEURONTIN) 100 MG capsule, Take 200 mg by mouth with breakfast, with lunch, and with evening meal., Disp: , Rfl:    gabapentin (NEURONTIN) 300 MG capsule, Take 300 mg by mouth at bedtime., Disp: , Rfl:    glipiZIDE (GLUCOTROL) 10 MG tablet, Take 10 mg by mouth daily before breakfast., Disp: , Rfl:    HYDROcodone-acetaminophen (NORCO/VICODIN) 5-325 MG tablet, Take 1 tablet by mouth every 6 (six) hours as needed for moderate pain., Disp: , Rfl:    meclizine (ANTIVERT) 25 MG tablet, Take 25 mg by mouth 3 (three) times daily as needed for dizziness., Disp: , Rfl:    Multiple Vitamin (MULTIVITAMIN WITH MINERALS) TABS tablet, Take 1 tablet by mouth in the morning., Disp: , Rfl:    oxybutynin (DITROPAN) 5 MG tablet, Take 1 tablet by mouth 2 (two) times daily., Disp: , Rfl:    rOPINIRole (REQUIP) 0.5 MG tablet, Take 0.5 mg by mouth at bedtime., Disp: , Rfl:    warfarin (COUMADIN) 6 MG tablet, Take 6 mg by mouth See admin instructions. Take 1 tablet (6 mg) by mouth on Mondays, Tuesdays, Wednesdays, & Thursdays Take 1 tablet (6 mg) with 1 tablet (1 mg) by mouth on Fridays, Saturdays & Sundays (7 mg), Disp: , Rfl:    calcium carbonate  (CALCIUM 600) 600 MG TABS tablet, Take 1 tablet (600 mg total) by mouth 2 (two) times daily with a meal., Disp: 60 tablet, Rfl: 5  Physical exam:  Vitals:   06/23/22 1009 06/23/22 1011  BP:  (!) 150/78  Pulse:  75  Resp:  18  SpO2:  98%  Weight: 207 lb (93.9 kg)  Height: 5\' 3"  (1.6 m)    Physical Exam Cardiovascular:     Rate and Rhythm: Normal rate and regular rhythm.     Heart sounds: Normal heart sounds.  Pulmonary:     Effort: Pulmonary effort is normal.     Breath sounds: Normal breath sounds.  Abdominal:     General: Bowel sounds are normal.     Palpations: Abdomen is soft.  Skin:    General: Skin is warm and dry.  Neurological:     Mental Status: She is alert and oriented to person, place, and time.         Latest Ref Rng & Units 06/21/2022    8:16 AM  CMP  Creatinine 0.44 - 1.00 mg/dL 0.70       Latest Ref Rng & Units 11/22/2021    8:23 AM  CBC  WBC 4.0 - 10.5 K/uL 7.9   Hemoglobin 12.0 - 15.0 g/dL 12.7   Hematocrit 36.0 - 46.0 % 40.0   Platelets 150 - 400 K/uL 153     No images are attached to the encounter.  CT Chest W Contrast  Result Date: 06/21/2022 CLINICAL DATA:  RIGHT upper lobe lung cancer in a 72 year old female. * Tracking Code: BO * EXAM: CT CHEST WITH CONTRAST TECHNIQUE: Multidetector CT imaging of the chest was performed during intravenous contrast administration. RADIATION DOSE REDUCTION: This exam was performed according to the departmental dose-optimization program which includes automated exposure control, adjustment of the mA and/or kV according to patient size and/or use of iterative reconstruction technique. CONTRAST:  155mL OMNIPAQUE IOHEXOL 300 MG/ML  SOLN COMPARISON:  November 16, 2021 FINDINGS: Cardiovascular: Stable appearance of the heart great vessels with signs of aortic atherosclerosis. Mediastinum/Nodes: No adenopathy in the chest. Stable scattered small lymph nodes throughout the chest. Esophagus grossly normal. Lungs/Pleura: Bandlike  scarring extends from the RIGHT hilum. Areas of consolidative change and bronchiectasis in the central lung with similar appearance. No signs of pleural effusion or new suspicious pulmonary nodule. No new areas of consolidation. Airways removed from post treatment changes about the RIGHT hilum are patent. Stable 3 mm LEFT upper lobe nodule (image 53//3) Upper Abdomen: Incidental imaging of upper abdominal contents shows no acute process. There is cholelithiasis and at least moderate hepatic steatosis. Adrenal glands are normal. Musculoskeletal: No acute bone finding. No destructive bone process. Spinal degenerative changes. IMPRESSION: 1. Stable post treatment changes about the RIGHT hilum without evidence of recurrent or metastatic disease. 2. Stable 3 mm LEFT upper lobe nodule. 3. Cholelithiasis and at least moderate hepatic steatosis. 4. Aortic atherosclerosis. Aortic Atherosclerosis (ICD10-I70.0). Electronically Signed   By: Zetta Bills M.D.   On: 06/21/2022 16:02   DG PAIN CLINIC C-ARM 1-60 MIN NO REPORT  Result Date: 06/15/2022 Fluoro was used, but no Radiologist interpretation will be provided. Please refer to "NOTES" tab for provider progress note.    Assessment and plan- Patient is a 72 y.o. female with history of right upper lobe lung cancer s/p SBRT here for a routine follow-up  I have reviewed CT images independently and discussed finding with the patient which does not show any evidence of recurrent or progressive disease.  Stable postradiation changes noted in the right hilum.  3 mm left upper lobe nodule is overall stable.  I will plan to see her back in 6 months with CT scans prior   Visit Diagnosis 1. Encounter for follow-up surveillance of lung cancer      Dr. Randa Evens, MD, MPH  [facility] [phone] 06/23/2022 1:42 PM

## 2022-06-25 DIAGNOSIS — R791 Abnormal coagulation profile: Secondary | ICD-10-CM | POA: Diagnosis not present

## 2022-06-27 NOTE — Progress Notes (Deleted)
PROVIDER NOTE: Information contained herein reflects review and annotations entered in association with encounter. Interpretation of such information and data should be left to medically-trained personnel. Information provided to patient can be located elsewhere in the medical record under "Patient Instructions". Document created using STT-dictation technology, any transcriptional errors that may result from process are unintentional.    Patient: Kara [name]  Service Category: E/M  Provider: Gaspar Cola, MD  DOB: 1950-08-17  DOS: 06/29/2022  Referring Provider: Tracie Harrier, MD  MRN: [id number]  Specialty: Interventional Pain Management  PCP: Tracie Harrier, MD  Type: Established Patient  Setting: Ambulatory outpatient    Location: Office  Delivery: Face-to-face     HPI  [name], a 72 y.o. year old female, is here today because of her No primary diagnosis found.. [name] primary complain today is No chief complaint on file. Last encounter: My last encounter with her was on 06/15/2022. Pertinent problems: 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); Lumbar pars defect (L3 and L4) (Bilateral); Diabetic peripheral neuropathy ([place]); Chronic lower extremity pain (2ry area of Pain) (Bilateral); Chronic radicular pain of lower extremity; Osteoarthritis of hip (Right); Chronic low back pain (1ry area of Pain) (Bilateral) w/o sciatica; Spondylosis  without myelopathy or radiculopathy, lumbosacral region; Other specified dorsopathies, sacral and sacrococcygeal region; Secondary osteoarthritis of multiple sites; Sacral insufficiency fracture; DDD (degenerative disc disease), lumbosacral; Coccygodynia; Chronic sacroiliac joint pain (Left); Osteoarthritis of lumbar spine; Spondylolisthesis, lumbosacral region; Abnormal MRI, lumbar spine (08/09/2019); Spasm of muscle of lower back; Chronic hip pain (Bilateral) (R>L); Osteoarthritis of hips (Bilateral); Greater trochanteric bursitis (Right); Chronic hip pain (Right); Chronic low back pain (Right) w/o sciatica; Greater trochanteric bursitis (Left); and Other bursitis of hip, not elsewhere classified (gluteus medius bursa) (Left) on their pertinent problem list. Pain Assessment: Severity of   is reported as a  /10. Location:    / . Onset:  . Quality:  . Timing:  . Modifying factor(s):  Marland Kitchen Vitals:  vitals were not taken for this visit.  BMI: Estimated body mass index is 36.67 kg/m as calculated from the following:   Height as of 06/23/22: '5\' 3"'$  (1.6 m).   Weight as of 06/23/22: 207 lb (93.9 kg).  Reason for encounter: post-procedure evaluation and assessment. ***  Post-procedure evaluation   Type: Lumbar Facet, Medial Branch Block(s) #1  Laterality: Bilateral  Level: L2, L3, L4, L5, and S1 Medial Branch Level(s). Injecting these levels blocks the L3-4, L4-5, and L5-S1 lumbar facet joints. Imaging: Fluoroscopic guidance         Anesthesia: Local anesthesia (1-2% Lidocaine) Anxiolysis: IV Versed 2.0 mg Sedation: No Sedation                       DOS: 06/15/2022 Performed by: Gaspar Cola, MD  Primary Purpose: Diagnostic/Therapeutic Indications: Low back pain severe enough to impact quality of life or function. 1. Lumbar facet syndrome (Bilateral)   2. Chronic low back pain (1ry area of Pain) (Bilateral) w/o sciatica   3. DDD (degenerative disc disease), lumbosacral   4. Grade  1-2  Anterolisthesis of L4/L5 & L5/S1   5. Lumbar facet arthropathy (L3-4, L4-5, and L5-S1) (Bilateral)   6. 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 Spondylosis without myelopathy or radiculopathy, lumbosacral region   8. Abnormal MRI, lumbar spine (08/09/2019)   9. Chronic anticoagulation ([facility])   10. Morbid obesity with BMI of 40.0-44.9, adult ([place])   11. Truncal obesity    NAS-11 Pain score:   Pre-procedure: 10-Worst pain ever/10   Post-procedure: 9 /10      Effectiveness:  Initial hour after procedure:   ***. Subsequent 4-6 hours post-procedure:   ***. Analgesia past initial 6 hours:   ***. Ongoing improvement:  Analgesic:  *** Function:    ***    ROM:    ***     Pharmacotherapy Assessment  Analgesic: Oxycodone IR 10 mg, 1 tab PO qd,  from PCP. No chronic opioid analgesics therapy prescribed by our practice, 2ry to Medication Agreement Violation. (Getting oxycodone from PCP in multiple occasions, according to PMP)(11/21/20; 12/26/20; 01/16/21; 02/13/21). Opioid analgesic pharmacotherapy D/C'ed on 03/03/2021. MME: 20 mg/day.   Monitoring: [place] PMP: PDMP reviewed during this encounter.       Pharmacotherapy: No side-effects or adverse reactions reported. Compliance: No problems identified. Effectiveness: Clinically acceptable.  No notes on file  No results found for: "CBDTHCR" No results found for: "D8THCCBX" No results found for: "D9THCCBX"  UDS:  Summary  Date Value Ref Range Status  11/12/2020 Note  Final    Comment:    ==================================================================== [employer] Select 13 (MW) ==================================================================== Test                             Result       Flag       Units  Drug Present and Declared for Prescription Verification   Norhydrocodone                 61           EXPECTED   ng/mg creat    Norhydrocodone is an expected metabolite of hydrocodone.    Tramadol                        >4202        EXPECTED   ng/mg creat   O-Desmethyltramadol            >4202        EXPECTED   ng/mg creat   N-Desmethyltramadol            1712         EXPECTED   ng/mg creat    Source of tramadol is a prescription medication. O-desmethyltramadol    and N-desmethyltramadol are expected metabolites of tramadol.  Drug Absent but Declared for Prescription Verification   Hydrocodone                    Not Detected UNEXPECTED ng/mg creat    Hydrocodone is almost always present in patients taking this drug    consistently. Absence of hydrocodone could be due to lapse of time    since the last dose or unusual pharmacokinetics (rapid metabolism).  ==================================================================== Test                      Result    Flag   Units      Ref Range   Creatinine  119              mg/dL      >=20 ==================================================================== Declared Medications:  The flagging and interpretation on this report are based on the  following declared medications.  Unexpected results may arise from  inaccuracies in the declared medications.   **Note: The testing scope of this panel includes these medications:   Hydrocodone  Tramadol   **Note: The testing scope of this panel does not include the  following reported medications:   Acetaminophen  Alendronate (Fosamax)  Aspirin  Calcium  Candesartan  Cholecalciferol  Cyanocobalamin  Cyclobenzaprine  Dulaglutide  Gabapentin  Glipizide  Meclizine  Multivitamin  Nystatin  Ondansetron  Ropinirole  Tolterodine  Warfarin ==================================================================== For clinical consultation, please call [phone]. ====================================================================       ROS  Constitutional: Denies any fever or chills Gastrointestinal: No reported hemesis, hematochezia, vomiting, or acute GI distress Musculoskeletal: Denies  any acute onset joint swelling, redness, loss of ROM, or weakness Neurological: No reported episodes of acute onset apraxia, aphasia, dysarthria, agnosia, amnesia, paralysis, loss of coordination, or loss of consciousness  Medication Review  Dulaglutide, HYDROcodone-acetaminophen, Vitamin B-12, alendronate, calcium carbonate, candesartan, cholecalciferol, cyclobenzaprine, gabapentin, glipiZIDE, meclizine, multivitamin with minerals, oxybutynin, rOPINIRole, and warfarin  History Review  Allergy: [name] is allergic to adhesive [tape], other, latex, and morphine and related. Drug: [name]  reports no history of drug use. Alcohol:  reports no history of alcohol use. Tobacco:  reports that she has never smoked. She has never used smokeless tobacco. Social: [name]  reports that she has never smoked. She has never used smokeless tobacco. She reports that she does not drink alcohol and does not use drugs. 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 Surgical: [name]  has a past surgical history that includes Knee surgery; Carpal tunnel release (Bilateral); Colon resection sigmoid (N/A, 11/02/2016); Colostomy (N/A, 11/02/2016); Sigmoidoscopy (N/A, 11/13/2016); PULMONARY VENOGRAPHY (N/A, 11/19/2016); Colonoscopy with propofol (N/A, 02/03/2017); Colon surgery (11/02/2016); IVC FILTER INSERTION (Right, 10/2016); Colostomy reversal (N/A, 02/24/2017); Appendectomy (N/A, 02/24/2017); Lysis of adhesion (N/A, 02/24/2017); laparoscopy (N/A, 02/24/2017); [place]o loop diversion (N/A, 02/24/2017); Sigmoidoscopy (N/A, 05/19/2017); Ileostomy closure (N/A, 06/01/2017); IVC FILTER REMOVAL (N/A, 08/09/2017); Sacroplasty (N/A, 11/08/2017); Cataract  extraction w/PHACO (Left, 11/21/2018); Cataract extraction w/PHACO (Right, 12/12/2018); and Video bronchoscopy with endobronchial navigation (N/A, 02/23/2021). Family: family history includes Breast cancer in her mother; Cancer in her mother; Diabetes in her mother and sister; Heart disease in her father, mother, and sister.  Laboratory Chemistry Profile   Renal Lab Results  Component Value Date   BUN 10 11/22/2021   CREATININE 0.70 06/21/2022   GFRAA >60 07/09/2019   GFRNONAA >60 11/22/2021    Hepatic Lab Results  Component Value Date   AST 25 11/20/2021   ALT 31 11/20/2021   ALBUMIN 3.4 (L) 11/20/2021   ALKPHOS 38 11/20/2021   LIPASE 22 11/20/2021    Electrolytes Lab Results  Component Value Date   NA 142 11/22/2021   K 3.7 11/22/2021   CL 112 (H) 11/22/2021   CALCIUM 7.7 (L) 11/22/2021   MG 1.7 08/14/2020   PHOS 3.3 11/07/2016    Bone Lab Results  Component Value Date   25OHVITD1 25 (L) 11/02/2017   25OHVITD2 <1.0 11/02/2017   25OHVITD3 25 11/02/2017  Inflammation (CRP: Acute Phase) (ESR: Chronic Phase) Lab Results  Component Value Date   CRP 1.7 (H) 08/15/2020   ESRSEDRATE 12 11/02/2017   LATICACIDVEN 1.7 11/21/2021         Note: Above Lab results reviewed.  Recent Imaging Review  CT Chest W Contrast CLINICAL DATA:  RIGHT upper lobe lung cancer in a 72 year old female.  * Tracking Code: BO *  EXAM: CT CHEST WITH CONTRAST  TECHNIQUE: Multidetector CT imaging of the chest was performed during intravenous contrast administration.  RADIATION DOSE REDUCTION: This exam was performed according to the departmental dose-optimization program which includes automated exposure control, adjustment of the mA and/or kV according to patient size and/or use of iterative reconstruction technique.  CONTRAST:  132m OMNIPAQUE IOHEXOL 300 MG/ML  SOLN  COMPARISON:  November 16, 2021  FINDINGS: Cardiovascular: Stable appearance of the heart great vessels with signs of  aortic atherosclerosis.  Mediastinum/Nodes: No adenopathy in the chest. Stable scattered small lymph nodes throughout the chest. Esophagus grossly normal.  Lungs/Pleura: Bandlike scarring extends from the RIGHT hilum. Areas of consolidative change and bronchiectasis in the central lung with similar appearance. No signs of pleural effusion or new suspicious pulmonary nodule. No new areas of consolidation. Airways removed from post treatment changes about the RIGHT hilum are patent. Stable 3 mm LEFT upper lobe nodule (image 53//3)  Upper Abdomen: Incidental imaging of upper abdominal contents shows no acute process. There is cholelithiasis and at least moderate hepatic steatosis. Adrenal glands are normal.  Musculoskeletal: No acute bone finding. No destructive bone process. Spinal degenerative changes.  IMPRESSION: 1. Stable post treatment changes about the RIGHT hilum without evidence of recurrent or metastatic disease. 2. Stable 3 mm LEFT upper lobe nodule. 3. Cholelithiasis and at least moderate hepatic steatosis. 4. Aortic atherosclerosis.  Aortic Atherosclerosis (ICD10-I70.0).  Electronically Signed   By: GZetta BillsM.D.   On: 06/21/2022 16:02 Note: Reviewed        Physical Exam  General appearance: Well nourished, well developed, and well hydrated. In no apparent acute distress Mental status: Alert, oriented x 3 (person, place, & time)       Respiratory: No evidence of acute respiratory distress Eyes: PERLA Vitals: There were no vitals taken for this visit. BMI: Estimated body mass index is 36.67 kg/m as calculated from the following:   Height as of 06/23/22: '5\' 3"'$  (1.6 m).   Weight as of 06/23/22: 207 lb (93.9 kg). Ideal: Ideal body weight: 52.4 kg (115 lb 8.3 oz) Adjusted ideal body weight: 69 kg (152 lb 1.8 oz)  Assessment   Diagnosis Status  No diagnosis found. Controlled Controlled Controlled   Updated Problems: No problems updated.  Plan of Care   Problem-specific:  No problem-specific Assessment & Plan notes found for this encounter.  [name] has a current medication list which includes the following long-term medication(s): calcium carbonate, candesartan, glipizide, ropinirole, and warfarin.  Pharmacotherapy (Medications Ordered): No orders of the defined types were placed in this encounter.  Orders:  No orders of the defined types were placed in this encounter.  Follow-up plan:   No follow-ups on file.     Interventional Therapies  Risk  Complexity Considerations:   ANTICOAGULATION: Coumadin (Stop: 5 days  Re-start: 2hrs)  Hx DVT & PE ALLERGY: Latex Morbid obesity (class III) ([id number]  metabolic syndrome  NIDDM HTN No Lumbar RFA due to morbid obesity    Planned  Pending:   Palliative bilateral lumbar facet MBB [id number](1st  of 2024) (06/15/2022) (10-9/10)   Under consideration:      Completed:   Therapeutic bilateral lumbar facet MBB (L3-S1) x1 (04/06/2022) (80/80/90/90)  Diagnostic/therapeutic right IA hip inj. x2 (01/28/2022) (100/100/85/85)  Diagnostic/therapeutic left IA hip inj. x1 (01/28/2022) (100/100/85/85)  Diagnostic/therapeutic right trochanteric bursa inj. x1 (09/15/2021) (85/85/85)  Diagnostic/therapeutic left trochanteric bursa inj. x1 (01/28/2022) (100/100/85/85)  Therapeutic left lumbar facet RFA x1 (07/21/2021) (100/100/100 x1 week/80)  Therapeutic right lumbar facet RFA x1 (07/07/2021) (100/100/80/80)  Palliative/Therapeutic left L4-5 LESI x1 (11/10/2017) (100/80/0/0-25)  Diagnostic/therapeutic right L4 TFESI x1 (11/19/2021) (90/80/0/0) (fell at [employer]) Palliative left lumbar facet MBB x17 (05/05/2021) (100/100/75/85)  Palliative right lumbar facet MBB x23 (05/05/2021) (100/100/75/85)  Palliative right SI joint block x9 (01/28/2022) (100/100/80/80)  Diagnostic left SI joint block x2 (01/28/2022) (100/100/80/80)  Palliative/Therapeutic (Midline) caudal ESI x3 (06/08/2018)  (100/100/100 x 2-3 days/0)    Therapeutic  Palliative (PRN) options:   NO PRN procedures without F2F exam by Dr. Dossie Arbour due to multifactorial etiology of chronic pain.   Pharmacotherapy  Nonopioids transferred 03/27/2020: Calcium       Recent Visits Date Type Provider Dept  06/15/22 Procedure visit Milinda Pointer, MD [name]  04/22/22 Office Visit Milinda Pointer, MD [name]  04/06/22 Procedure visit Milinda Pointer, MD [name]  Showing recent visits within past 90 days and meeting all other requirements Future Appointments Date Type Provider Dept  06/29/22 Appointment Milinda Pointer, MD [name]  Showing future appointments within next 90 days and meeting all other requirements  I discussed the assessment and treatment plan with the patient. The patient was provided an opportunity to ask questions and all were answered. The patient agreed with the plan and demonstrated an understanding of the instructions.  Patient advised to call back or seek an in-person evaluation if the symptoms or condition worsens.  Duration of encounter: *** minutes.  Total time on encounter, as per AMA guidelines included both the face-to-face and non-face-to-face time personally spent by the physician and/or other qualified health care professional(s) on the day of the encounter (includes time in activities that require the physician or other qualified health care professional and does not include time in activities normally performed by clinical staff). Physician's time may include the following activities when performed: Preparing to see the patient (e.g., pre-charting review of records, searching for previously ordered imaging, lab work, and nerve conduction tests) Review of prior analgesic pharmacotherapies. Reviewing PMP Interpreting ordered tests (e.g., lab work, imaging, nerve conduction tests) Performing post-procedure evaluations,  including interpretation of diagnostic procedures Obtaining and/or reviewing separately obtained history Performing a medically appropriate examination and/or evaluation Counseling and educating the patient/family/caregiver Ordering medications, tests, or procedures Referring and communicating with other health care professionals (when not separately reported) Documenting clinical information in the electronic or other health record Independently interpreting results (not separately reported) and communicating results to the patient/ family/caregiver Care coordination (not separately reported)  Note by: Gaspar Cola, MD Date: 06/29/2022; Time: 1:18 PM

## 2022-06-29 ENCOUNTER — Ambulatory Visit: Payer: PPO | Admitting: Pain Medicine

## 2022-07-01 ENCOUNTER — Ambulatory Visit: Payer: PPO | Attending: Pain Medicine | Admitting: Pain Medicine

## 2022-07-01 ENCOUNTER — Encounter: Payer: Self-pay | Admitting: Pain Medicine

## 2022-07-01 VITALS — BP 145/79 | HR 88 | Temp 97.9°F | Resp 18 | Ht 63.0 in | Wt 202.0 lb

## 2022-07-01 DIAGNOSIS — G894 Chronic pain syndrome: Secondary | ICD-10-CM | POA: Insufficient documentation

## 2022-07-01 DIAGNOSIS — M4317 Spondylolisthesis, lumbosacral region: Secondary | ICD-10-CM | POA: Diagnosis not present

## 2022-07-01 DIAGNOSIS — M47816 Spondylosis without myelopathy or radiculopathy, lumbar region: Secondary | ICD-10-CM | POA: Diagnosis not present

## 2022-07-01 DIAGNOSIS — G8929 Other chronic pain: Secondary | ICD-10-CM | POA: Insufficient documentation

## 2022-07-01 DIAGNOSIS — M545 Low back pain, unspecified: Secondary | ICD-10-CM | POA: Diagnosis not present

## 2022-07-01 DIAGNOSIS — Z6841 Body Mass Index (BMI) 40.0 and over, adult: Secondary | ICD-10-CM | POA: Insufficient documentation

## 2022-07-01 DIAGNOSIS — M4310 Spondylolisthesis, site unspecified: Secondary | ICD-10-CM | POA: Diagnosis not present

## 2022-07-01 NOTE — Patient Instructions (Signed)
____________________________________________________________________________________________  Patient Information update  To: All of our patients.  Re: Name change.  It has been made official that our current name, "[facility]"   will soon be changed to "[facility]".   The purpose of this change is to eliminate any confusion created by the concept of our practice being a "[facility]ication [facility] Pain [facility]". In the past this has led to the misconception that we treat pain primarily by the use of prescription medications.  Nothing can be farther from the truth.   Understanding PAIN MANAGEMENT: To further understand what our practice does, you first have to understand that "Pain Management" is a subspecialty that requires additional training once a physician has completed their specialty training, which can be in either Anesthesia, Neurology, Psychiatry, or Physical Medicine and Rehabilitation (PMR). Each one of these contributes to the final approach taken by each physician to the management of their patient's pain. To be a "Pain Management Specialist" you must have first completed one of the specialty trainings below.  Anesthesiologists - trained in clinical pharmacology and interventional techniques such as nerve blockade and regional as well as central neuroanatomy. They are trained to block pain before, during, and after surgical interventions.  Neurologists - trained in the diagnosis and pharmacological treatment of complex neurological conditions, such as Multiple Sclerosis, Parkinson's, spinal cord injuries, and other systemic conditions that may be associated with symptoms that may include but are not limited to pain. They tend to rely primarily on the treatment of chronic pain using prescription medications.  Psychiatrist - trained in conditions affecting the psychosocial  wellbeing of patients including but not limited to depression, anxiety, schizophrenia, personality disorders, addiction, and other substance use disorders that may be associated with chronic pain. They tend to rely primarily on the treatment of chronic pain using prescription medications.   Physical Medicine and Rehabilitation (PMR) physicians, also known as physiatrists - trained to treat a wide variety of medical conditions affecting the brain, spinal cord, nerves, bones, joints, ligaments, muscles, and tendons. Their training is primarily aimed at treating patients that have suffered injuries that have caused severe physical impairment. Their training is primarily aimed at the physical therapy and rehabilitation of those patients. They may also work alongside orthopedic surgeons or neurosurgeons using their expertise in assisting surgical patients to recover after their surgeries.  INTERVENTIONAL PAIN MANAGEMENT is sub-subspecialty of Pain Management.  Our physicians are Board-certified in Anesthesia, Pain Management, and Interventional Pain Management.  This meaning that not only have they been trained and Board-certified in their specialty of Anesthesia, and subspecialty of Pain Management, but they have also received further training in the sub-subspecialty of Interventional Pain Management, in order to become Board-certified as INTERVENTIONAL PAIN MANAGEMENT SPECIALIST.    Mission: Our goal is to use our skills in  [place] as alternatives to the chronic use of prescription opioid medications for the treatment of pain. To make this more clear, we have changed our name to reflect what we do and offer. We will continue to offer medication management assessment and recommendations, but we will not be taking over any patient's medication management.  ____________________________________________________________________________________________      _________________________________________________________________________________________  Body mass index (BMI)  Body mass index (BMI) is a common tool for deciding whether a person has an appropriate body weight.  It measures a persons weight in relation to their height.   According to the [employer] of  health (NIH): A BMI of less than 18.5 means that a person is underweight. A BMI of between 18.5 and 24.9 is ideal. A BMI of between 25 and 29.9 is overweight. A BMI over 30 indicates obesity.  Weight Management Required  URGENT: Your weight has been found to be adversely affecting your health.  Dear Ms. Leonhardt:  Your current Estimated body mass index is 36.67 kg/m as calculated from the following:   Height as of 06/23/22: 5\' 3"  (1.6 m).   Weight as of 06/23/22: 207 lb (93.9 kg).  Please use the table below to identify your weight category and associated incidence of chronic pain, secondary to your weight.  Body Mass Index (BMI) Classification BMI level (kg/m2) Category Associated incidence of chronic pain  <18  Underweight   18.5-24.9 Ideal body weight   25-29.9 Overweight  20%  30-34.9 Obese (Class I)  68%  35-39.9 Severe obesity (Class II)  136%  >40 Extreme obesity (Class III)  254%   In addition: You will be considered "Morbidly Obese", if your BMI is above 30 and you have one or more of the following conditions which are known to be caused and/or directly associated with obesity: 1.    Type 2 Diabetes (Which in turn can lead to cardiovascular diseases (CVD), stroke, peripheral vascular diseases (PVD), retinopathy, nephropathy, and neuropathy) 2.    Cardiovascular Disease (High Blood Pressure; Congestive Heart Failure; High Cholesterol; Coronary Artery Disease; Angina; or History of Heart Attacks) 3.    Breathing problems (Asthma; obesity-hypoventilation syndrome; obstructive sleep apnea; chronic inflammatory airway disease; reactive airway disease; or shortness of  breath) 4.    Chronic kidney disease 5.    Liver disease (nonalcoholic fatty liver disease) 6.    High blood pressure 7.    Acid reflux (gastroesophageal reflux disease; heartburn) 8.    Osteoarthritis (OA) (with any of the following: hip pain; knee pain; and/or low back pain) 9.    Low back pain (Lumbar Facet Syndrome; and/or Degenerative Disc Disease) 10.  Hip pain (Osteoarthritis of hip) (For every 1 lbs of added body weight, there is a 2 lbs increase in pressure inside of each hip articulation. 1:2 mechanical relationship) 11.  Knee pain (Osteoarthritis of knee) (For every 1 lbs of added body weight, there is a 4 lbs increase in pressure inside of each knee articulation. 1:4 mechanical relationship) (patients with a BMI>30 kg/m2 were 6.8 times more likely to develop knee OA than normal-weight individuals) 12.  Cancer: Epidemiological studies have shown that obesity is a risk factor for: post-menopausal breast cancer; cancers of the endometrium, colon and kidney cancer; malignant adenomas of the oesophagus. Obese subjects have an approximately 1.5-3.5-fold increased risk of developing these cancers compared with normal-weight subjects, and it has been estimated that between 15 and 45% of these cancers can be attributed to overweight. More recent studies suggest that obesity may also increase the risk of other types of cancer, including pancreatic, hepatic and gallbladder cancer. (Ref: Obesity and cancer. Pischon T, Nthlings U, [employer] H. Proc Nutr Soc. 2008 May;67(2):128-45. doi: 76.7209/O7096283662947654.) The International Agency for Research on Cancer (IARC) has identified 13 cancers associated with overweight and obesity: meningioma, multiple myeloma, adenocarcinoma of the esophagus, and cancers of the thyroid, postmenopausal breast cancer, gallbladder, stomach, liver, pancreas, kidney, ovaries, uterus, colon and rectal (colorectal) cancers. 75 percent of all cancers diagnosed in women and 24 percent  of those diagnosed in men are associated with overweight and obesity.  Recommendation: At this point it  is urgent that you take a step back and concentrate in loosing weight. Dedicate 100% of your efforts on this task. Nothing else will improve your health more than bringing your weight down and your BMI to less than 30. If you are here, you probably have chronic pain. We know that most chronic pain patients have difficulty exercising secondary to their pain. For this reason, you must rely on proper nutrition and diet in order to lose the weight. If your BMI is above 40, you should seriously consider bariatric surgery. A realistic goal is to lose 10% of your body weight over a period of 12 months.  Be honest to yourself, if over time you have unsuccessfully tried to lose weight, then it is time for you to seek professional help and to enter a medically supervised weight management program, and/or undergo bariatric surgery. Stop procrastinating.   Pain management considerations:  1.    Pharmacological Problems: Be advised that the use of opioid analgesics (oxycodone; hydrocodone; morphine; methadone; codeine; and all of their derivatives) have been associated with decreased metabolism and weight gain.  For this reason, should we see that you are unable to lose weight while taking these medications, it may become necessary for [place] to taper down and indefinitely discontinue them.  2.    Technical Problems: The incidence of successful interventional therapies decreases as the patient's BMI increases. It is much more difficult to accomplish a safe and effective interventional therapy on a patient with a BMI above 35. 3.    Radiation Exposure Problems: The x-rays machine, used to accomplish injection therapies, will automatically increase their x-ray output in order to capture an appropriate bone image. This means that radiation exposure increases exponentially with the patient's BMI. (The higher the BMI, the higher  the radiation exposure.) Although the level of radiation used at a given time is still safe to the patient, it is not for the physician and/or assisting staff. Unfortunately, radiation exposure is accumulative. Because physicians and the staff have to do procedures and be exposed on a daily basis, this can result in health problems such as cancer and radiation burns. Radiation exposure to the staff is monitored by the radiation batches that they wear. The exposure levels are reported back to the staff on a quarterly basis. Depending on levels of exposure, physicians and staff may be obligated by law to decrease this exposure. This means that they have the right and obligation to refuse providing therapies where they may be overexposed to radiation. For this reason, physicians may decline to offer therapies such as radiofrequency ablation or implants to patients with a BMI above 40. 4.    Current Trends: Be advised that the current trend is to no longer offer certain therapies to patients with a BMI equal to, or above 35, due to increase perioperative risks, increased technical procedural difficulties, and excessive radiation exposure to healthcare personnel.  _________________________________________________________________________________________

## 2022-07-01 NOTE — Progress Notes (Signed)
PROVIDER NOTE: Information contained herein reflects review and annotations entered in association with encounter. Interpretation of such information and data should be left to medically-trained personnel. Information provided to patient can be located elsewhere in the medical record under "Patient Instructions". Document created using STT-dictation technology, any transcriptional errors that may result from process are unintentional.    Patient: Kara [name]  Service Category: E/M  Provider: Gaspar Cola, MD  DOB: 1951/01/29  DOS: 07/01/2022  Referring Provider: Tracie Harrier, MD  MRN: [id number]  Specialty: Interventional Pain Management  PCP: Tracie Harrier, MD  Type: Established Patient  Setting: Ambulatory outpatient    Location: Office  Delivery: Face-to-face     HPI  Kara [name], a 72 y.o. 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 Kara [name] primary complain today is Back Pain (lower) Last encounter: My last encounter with her was on 06/15/2022. 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); Lumbar pars defect (L3 and L4) (Bilateral); Diabetic peripheral neuropathy ([place]); Chronic lower extremity pain (2ry area of Pain) (Bilateral); Chronic radicular pain of lower extremity; Osteoarthritis of hip (Right); Chronic low back pain (1ry area of Pain) (Bilateral) w/o sciatica; Spondylosis  without myelopathy or radiculopathy, lumbosacral region; Other specified dorsopathies, sacral and sacrococcygeal region; Secondary osteoarthritis of multiple sites; Sacral insufficiency fracture; DDD (degenerative disc disease), lumbosacral; Coccygodynia; Chronic sacroiliac joint pain (Left); Osteoarthritis of lumbar spine; Spondylolisthesis, lumbosacral region; Abnormal MRI, lumbar spine (08/09/2019); Spasm of muscle of lower back; Chronic hip pain (Bilateral) (R>L); Osteoarthritis of hips (Bilateral); Greater trochanteric bursitis (Right); Chronic hip pain (Right); Chronic low back pain (Right) w/o sciatica; Greater trochanteric bursitis (Left); and Other bursitis of hip, not elsewhere classified (gluteus medius bursa) (Left) on their pertinent problem list. Pain Assessment: Severity of Chronic pain is reported as a 5 /10. Location: Back Lower/right hip and right leg. Onset: More than a month ago. Quality: Sharp. Timing: Intermittent. Modifying factor(s): lying down on left side. Vitals:  height is 5\' 3"  (1.6 m) and weight is 202 lb (91.6 kg). Her temporal temperature is 97.9 F (36.6 C). Her blood pressure is 145/79 (abnormal) and her pulse is 88. Her respiration is 18 and oxygen saturation is 96%.  BMI: Estimated body mass index is 35.78 kg/m as calculated from the following:   Height as of this encounter: 5\' 3"  (1.6 m).   Weight as of this encounter: 202 lb (91.6 kg).  Reason for encounter: post-procedure evaluation and assessment.  The patient indicated having attained 90% improvement of the pain for the duration of the local anesthetic.  This would suggest that 90% of the pain is coming from the facet joints and there is still an additional 10% that may be coming from somewhere else.  The patient does have a history of sacroiliac joint pain and therefore it is very possible that that 10% belongs to that region.  She again attained persistent relief meaning that there is an inflammatory component that  is being addressed by the steroids.  However today we have talked about this and  the fact that I rather not have her be taking frequent injections.  I have informed her that we will try to avoid any additional injections at least until after 08/14/2022.  She understood and accepted.  Post-procedure evaluation   Type: Lumbar Facet, Medial Branch Block(s) #1  Laterality: Bilateral  Level: L2, L3, L4, L5, and S1 Medial Branch Level(s). Injecting these levels blocks the L3-4, L4-5, and L5-S1 lumbar facet joints. Imaging: Fluoroscopic guidance         Anesthesia: Local anesthesia (1-2% Lidocaine) Anxiolysis: IV Versed 2.0 mg Sedation: No Sedation                       DOS: 06/15/2022 Performed by: Gaspar Cola, MD  Primary Purpose: Diagnostic/Therapeutic Indications: Low back pain severe enough to impact quality of life or function. 1. Lumbar facet syndrome (Bilateral)   2. Chronic low back pain (1ry area of Pain) (Bilateral) w/o sciatica   3. DDD (degenerative disc disease), lumbosacral   4. 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 Lumbar facet arthropathy (L3-4, L4-5, and L5-S1) (Bilateral)   6. 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 Spondylosis without myelopathy or radiculopathy, lumbosacral region   8. Abnormal MRI, lumbar spine (08/09/2019)   9. Chronic anticoagulation (COUMADIN)   10. Morbid obesity with BMI of 40.0-44.9, adult ([place])   11. Truncal obesity    NAS-11 Pain score:   Pre-procedure: 10-Worst pain ever/10   Post-procedure: 9 /10       Effectiveness:  Initial hour after procedure: 90 %. Subsequent 4-6 hours post-procedure: 90 %. Analgesia past initial 6 hours: 75 %. Ongoing improvement:  Analgesic: The patient indicates having an ongoing 75% improvement of her pain.  At this time she refers not needing anything else. Function: Kara [name] reports improvement in function ROM: Kara [name] reports improvement in ROM  Pharmacotherapy Assessment   Analgesic: Oxycodone IR 10 mg, 1 tab PO qd,  from PCP. No chronic opioid analgesics therapy prescribed by our practice, 2ry to Medication Agreement Violation. (Getting oxycodone from PCP in multiple occasions, according to PMP)(11/21/20; 12/26/20; 01/16/21; 02/13/21). Opioid analgesic pharmacotherapy D/C'ed on 03/03/2021. MME: 20 mg/day.   Monitoring:  PMP: PDMP not reviewed this encounter.       Pharmacotherapy: No side-effects or adverse reactions reported. Compliance: No problems identified. Effectiveness: Clinically acceptable.  No notes on file  No results found for: "CBDTHCR" No results found for: "D8THCCBX" No results found for: "D9THCCBX"  UDS:  Summary  Date Value Ref Range Status  11/12/2020 Note  Final    Comment:    ==================================================================== [employer] [facility] (MW) ==================================================================== Test                             Result       Flag       Units  Drug Present and Declared for Prescription Verification   Norhydrocodone                 61           EXPECTED   ng/mg creat    Norhydrocodone is an expected metabolite of hydrocodone.    Tramadol                       >4202        EXPECTED   ng/mg creat   O-Desmethyltramadol            >  4202        EXPECTED   ng/mg creat   N-Desmethyltramadol            1712         EXPECTED   ng/mg creat    Source of tramadol is a prescription medication. O-desmethyltramadol    and N-desmethyltramadol are expected metabolites of tramadol.  Drug Absent but Declared for Prescription Verification   Hydrocodone                    Not Detected UNEXPECTED ng/mg creat    Hydrocodone is almost always present in patients taking this drug    consistently. Absence of hydrocodone could be due to lapse of time    since the last dose or unusual pharmacokinetics (rapid metabolism).  ==================================================================== Test                       Result    Flag   Units      Ref Range   Creatinine              119              mg/dL      >=20 ==================================================================== Declared Medications:  The flagging and interpretation on this report are based on the  following declared medications.  Unexpected results may arise from  inaccuracies in the declared medications.   **Note: The testing scope of this panel includes these medications:   Hydrocodone  Tramadol   **Note: The testing scope of this panel does not include the  following reported medications:   Acetaminophen  Alendronate (Fosamax)  Aspirin  Calcium  Candesartan  Cholecalciferol  Cyanocobalamin  Cyclobenzaprine  Dulaglutide  Gabapentin  Glipizide  Meclizine  Multivitamin  Nystatin  Ondansetron  Ropinirole  Tolterodine  Warfarin ==================================================================== For clinical consultation, please call [phone]. ====================================================================       ROS  Constitutional: Denies any fever or chills Gastrointestinal: No reported hemesis, hematochezia, vomiting, or acute GI distress Musculoskeletal: Denies any acute onset joint swelling, redness, loss of ROM, or weakness Neurological: No reported episodes of acute onset apraxia, aphasia, dysarthria, agnosia, amnesia, paralysis, loss of coordination, or loss of consciousness  Medication Review  Dulaglutide, HYDROcodone-acetaminophen, Vitamin B-12, alendronate, calcium carbonate, candesartan, cholecalciferol, cyclobenzaprine, gabapentin, glipiZIDE, meclizine, multivitamin with minerals, oxybutynin, rOPINIRole, and warfarin  History Review  Allergy: Kara [name] is allergic to adhesive [tape], other, latex, and morphine and related. Drug: Kara [name]  reports no history of drug use. Alcohol:  reports no history of alcohol use. Tobacco:  reports that she has never smoked. She  has never used smokeless tobacco. Social: Kara [name]  reports that she has never smoked. She has never used smokeless tobacco. She reports that she does not drink alcohol and does not use drugs. 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 Surgical: Ms. Poellnitz  has a past surgical history that includes Knee surgery; Carpal tunnel release (Bilateral); Colon resection sigmoid (N/A, 11/02/2016); Colostomy (N/A, 11/02/2016); Sigmoidoscopy (N/A, 11/13/2016); PULMONARY VENOGRAPHY (N/A, 11/19/2016); Colonoscopy with propofol (N/A, 02/03/2017); Colon surgery (11/02/2016); IVC FILTER INSERTION (Right, 10/2016); Colostomy reversal (N/A, 02/24/2017); Appendectomy (N/A, 02/24/2017); Lysis of adhesion (N/A, 02/24/2017); laparoscopy (N/A, 02/24/2017); Ileo loop diversion (N/A, 02/24/2017); Sigmoidoscopy (N/A, 05/19/2017); Ileostomy closure (N/A, 06/01/2017);  IVC FILTER REMOVAL (N/A, 08/09/2017); Sacroplasty (N/A, 11/08/2017); Cataract extraction w/PHACO (Left, 11/21/2018); Cataract extraction w/PHACO (Right, 12/12/2018); and Video bronchoscopy with endobronchial navigation (N/A, 02/23/2021). Family: family history includes Breast cancer in her mother; Cancer in her mother; Diabetes in her mother and sister; Heart disease in her father, mother, and sister.  Laboratory Chemistry Profile   Renal Lab Results  Component Value Date   BUN 10 11/22/2021   CREATININE 0.70 06/21/2022   GFRAA >60 07/09/2019   GFRNONAA >60 11/22/2021    Hepatic Lab Results  Component Value Date   AST 25 11/20/2021   ALT 31 11/20/2021   ALBUMIN 3.4 (L) 11/20/2021   ALKPHOS 38 11/20/2021   LIPASE 22 11/20/2021    Electrolytes Lab Results  Component Value Date   NA 142  11/22/2021   K 3.7 11/22/2021   CL 112 (H) 11/22/2021   CALCIUM 7.7 (L) 11/22/2021   MG 1.7 08/14/2020   PHOS 3.3 11/07/2016    Bone Lab Results  Component Value Date   25OHVITD1 25 (L) 11/02/2017   25OHVITD2 <1.0 11/02/2017   25OHVITD3 25 11/02/2017    Inflammation (CRP: Acute Phase) (ESR: Chronic Phase) Lab Results  Component Value Date   CRP 1.7 (H) 08/15/2020   ESRSEDRATE 12 11/02/2017   LATICACIDVEN 1.7 11/21/2021         Note: Above Lab results reviewed.  Recent Imaging Review  CT Chest W Contrast CLINICAL DATA:  RIGHT upper lobe lung cancer in a 72 year old female.  * Tracking Code: BO *  EXAM: CT CHEST WITH CONTRAST  TECHNIQUE: Multidetector CT imaging of the chest was performed during intravenous contrast administration.  RADIATION DOSE REDUCTION: This exam was performed according to the departmental dose-optimization program which includes automated exposure control, adjustment of the mA and/or kV according to patient size and/or use of iterative reconstruction technique.  CONTRAST:  149mL OMNIPAQUE IOHEXOL 300 MG/ML  SOLN  COMPARISON:  November 16, 2021  FINDINGS: Cardiovascular: Stable appearance of the heart great vessels with signs of aortic atherosclerosis.  Mediastinum/Nodes: No adenopathy in the chest. Stable scattered small lymph nodes throughout the chest. Esophagus grossly normal.  Lungs/Pleura: Bandlike scarring extends from the RIGHT hilum. Areas of consolidative change and bronchiectasis in the central lung with similar appearance. No signs of pleural effusion or new suspicious pulmonary nodule. No new areas of consolidation. Airways removed from post treatment changes about the RIGHT hilum are patent. Stable 3 mm LEFT upper lobe nodule (image 53//3)  Upper Abdomen: Incidental imaging of upper abdominal contents shows no acute process. There is cholelithiasis and at least moderate hepatic steatosis. Adrenal glands are  normal.  Musculoskeletal: No acute bone finding. No destructive bone process. Spinal degenerative changes.  IMPRESSION: 1. Stable post treatment changes about the RIGHT hilum without evidence of recurrent or metastatic disease. 2. Stable 3 mm LEFT upper lobe nodule. 3. Cholelithiasis and at least moderate hepatic steatosis. 4. Aortic atherosclerosis.  Aortic Atherosclerosis (ICD10-I70.0).  Electronically Signed   By: Zetta Bills M.D.   On: 06/21/2022 16:02 Note: Reviewed        Physical Exam  General appearance: Well nourished, well developed, and well hydrated. In no apparent acute distress Mental status: Alert, oriented x 3 (person, place, & time)       Respiratory: No evidence of acute respiratory distress Eyes: PERLA Vitals: BP (!) 145/79   Pulse 88   Temp 97.9 F (36.6 C) (Temporal)   Resp 18   Ht 5\' 3"  (1.6 m)  Wt 202 lb (91.6 kg)   SpO2 96%   BMI 35.78 kg/m  BMI: Estimated body mass index is 35.78 kg/m as calculated from the following:   Height as of this encounter: 5\' 3"  (1.6 m).   Weight as of this encounter: 202 lb (91.6 kg). Ideal: Ideal body weight: 52.4 kg (115 lb 8.3 oz) Adjusted ideal body weight: 68.1 kg (150 lb 1.8 oz)  Assessment   Diagnosis Status  1. Lumbar facet syndrome (Bilateral)   2. Chronic low back pain (1ry area of Pain) (Bilateral) w/o sciatica   3. Lumbar facet arthropathy (L3-4, L4-5, and L5-S1) (Bilateral)   4. 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 Chronic pain syndrome   6. Morbid obesity with BMI of 40.0-44.9, adult ([place])    Controlled Controlled Controlled   Updated Problems: No problems updated.  Plan of Care  Problem-specific:  No problem-specific Assessment & Plan notes found for this encounter.  Kara [name] has a current medication list which includes the following long-term medication(s): candesartan, glipizide, ropinirole, warfarin, and calcium carbonate.  Pharmacotherapy (Medications  Ordered): No orders of the defined types were placed in this encounter.  Orders:  No orders of the defined types were placed in this encounter.  Follow-up plan:   Return if symptoms worsen or fail to improve.     Interventional Therapies  Risk  Complexity Considerations:   ANTICOAGULATION: Coumadin (Stop: 5 days  Re-start: 2hrs)  Hx DVT & PE ALLERGY: Latex Morbid obesity (class III) (JHE>17)  metabolic syndrome  NIDDM HTN No Lumbar RFA due to morbid obesity    Planned  Pending:   Palliative bilateral lumbar facet MBB E08X44 (1st of 2024) (06/15/2022) (10-9/10)   Under consideration:      Completed:   Therapeutic bilateral lumbar facet MBB (L3-S1) x1 (04/06/2022) (80/80/90/90)  Diagnostic/therapeutic right IA hip inj. x2 (01/28/2022) (100/100/85/85)  Diagnostic/therapeutic left IA hip inj. x1 (01/28/2022) (100/100/85/85)  Diagnostic/therapeutic right trochanteric bursa inj. x1 (09/15/2021) (85/85/85)  Diagnostic/therapeutic left trochanteric bursa inj. x1 (01/28/2022) (100/100/85/85)  Therapeutic left lumbar facet RFA x1 (07/21/2021) (100/100/100 x1 week/80) (Lasted 9 months)  Therapeutic right lumbar facet RFA x1 (07/07/2021) (100/100/80/80) (Lasted 9 months)  Palliative/Therapeutic left L4-5 LESI x1 (11/10/2017) (100/80/0/0-25)  Diagnostic/therapeutic right L4 TFESI x1 (11/19/2021) (90/80/0/0) (fell at [employer]) Palliative left lumbar facet (L2-S1) MBB x17 (06/15/2022) (100/100/75/85)  Palliative right lumbar facet (L2-S1) MBB x23 (06/15/2022) (100/100/75/85)  Palliative right SI joint block x9 (01/28/2022) (100/100/80/80)  Diagnostic left SI joint block x2 (01/28/2022) (100/100/80/80)  Palliative/Therapeutic (Midline) caudal ESI x3 (06/08/2018) (100/100/100 x 2-3 days/0)    Therapeutic  Palliative (PRN) options:   NO PRN procedures without F2F exam by Dr. Dossie Arbour due to multifactorial etiology of chronic pain. No more procedures until after 08/14/2022.    Pharmacotherapy   Nonopioids transferred 03/27/2020: Calcium        Recent Visits Date Type Provider Dept  06/15/22 Procedure visit Milinda Pointer, MD [name]  04/22/22 Office Visit Milinda Pointer, MD [name]  04/06/22 Procedure visit Milinda Pointer, MD [name]  Showing recent visits within past 90 days and meeting all other requirements Today's Visits Date Type Provider Dept  07/01/22 Office Visit Milinda Pointer, MD [name]  Showing today's visits and meeting all other requirements Future Appointments No visits were found meeting these conditions. Showing future appointments within next 90 days and meeting all other requirements  I discussed the assessment and treatment plan with the  patient. The patient was provided an opportunity to ask questions and all were answered. The patient agreed with the plan and demonstrated an understanding of the instructions.  Patient advised to call back or seek an in-person evaluation if the symptoms or condition worsens.  Duration of encounter: 30 minutes.  Total time on encounter, as per AMA guidelines included both the face-to-face and non-face-to-face time personally spent by the physician and/or other qualified health care professional(s) on the day of the encounter (includes time in activities that require the physician or other qualified health care professional and does not include time in activities normally performed by clinical staff). Physician's time may include the following activities when performed: Preparing to see the patient (e.g., pre-charting review of records, searching for previously ordered imaging, lab work, and nerve conduction tests) Review of prior analgesic pharmacotherapies. Reviewing PMP Interpreting ordered tests (e.g., lab work, imaging, nerve conduction tests) Performing post-procedure evaluations, including interpretation of diagnostic procedures Obtaining and/or  reviewing separately obtained history Performing a medically appropriate examination and/or evaluation Counseling and educating the patient/family/caregiver Ordering medications, tests, or procedures Referring and communicating with other health care professionals (when not separately reported) Documenting clinical information in the electronic or other health record Independently interpreting results (not separately reported) and communicating results to the patient/ family/caregiver Care coordination (not separately reported)  Note by: Gaspar Cola, MD Date: 07/01/2022; Time: 2:58 PM

## 2022-07-12 DIAGNOSIS — R8290 Unspecified abnormal findings in urine: Secondary | ICD-10-CM | POA: Diagnosis not present

## 2022-07-12 DIAGNOSIS — E1165 Type 2 diabetes mellitus with hyperglycemia: Secondary | ICD-10-CM | POA: Diagnosis not present

## 2022-07-12 DIAGNOSIS — Z7901 Long term (current) use of anticoagulants: Secondary | ICD-10-CM | POA: Diagnosis not present

## 2022-07-12 DIAGNOSIS — I10 Essential (primary) hypertension: Secondary | ICD-10-CM | POA: Diagnosis not present

## 2022-07-12 DIAGNOSIS — R682 Dry mouth, unspecified: Secondary | ICD-10-CM | POA: Diagnosis not present

## 2022-07-12 DIAGNOSIS — Z6836 Body mass index (BMI) 36.0-36.9, adult: Secondary | ICD-10-CM | POA: Diagnosis not present

## 2022-07-12 DIAGNOSIS — R791 Abnormal coagulation profile: Secondary | ICD-10-CM | POA: Diagnosis not present

## 2022-07-16 DIAGNOSIS — N3941 Urge incontinence: Secondary | ICD-10-CM | POA: Diagnosis not present

## 2022-07-16 DIAGNOSIS — E1165 Type 2 diabetes mellitus with hyperglycemia: Secondary | ICD-10-CM | POA: Diagnosis not present

## 2022-07-16 DIAGNOSIS — Z7901 Long term (current) use of anticoagulants: Secondary | ICD-10-CM | POA: Diagnosis not present

## 2022-07-16 DIAGNOSIS — R682 Dry mouth, unspecified: Secondary | ICD-10-CM | POA: Diagnosis not present

## 2022-07-16 DIAGNOSIS — G894 Chronic pain syndrome: Secondary | ICD-10-CM | POA: Diagnosis not present

## 2022-07-16 DIAGNOSIS — I10 Essential (primary) hypertension: Secondary | ICD-10-CM | POA: Diagnosis not present

## 2022-07-16 DIAGNOSIS — G8929 Other chronic pain: Secondary | ICD-10-CM | POA: Diagnosis not present

## 2022-07-16 DIAGNOSIS — N390 Urinary tract infection, site not specified: Secondary | ICD-10-CM | POA: Diagnosis not present

## 2022-07-16 DIAGNOSIS — R42 Dizziness and giddiness: Secondary | ICD-10-CM | POA: Diagnosis not present

## 2022-07-16 DIAGNOSIS — M5136 Other intervertebral disc degeneration, lumbar region: Secondary | ICD-10-CM | POA: Diagnosis not present

## 2022-07-16 DIAGNOSIS — Z6836 Body mass index (BMI) 36.0-36.9, adult: Secondary | ICD-10-CM | POA: Diagnosis not present

## 2022-07-16 DIAGNOSIS — Z0000 Encounter for general adult medical examination without abnormal findings: Secondary | ICD-10-CM | POA: Diagnosis not present

## 2022-07-19 DIAGNOSIS — I700 Atherosclerosis of aorta: Secondary | ICD-10-CM | POA: Diagnosis not present

## 2022-07-19 DIAGNOSIS — I10 Essential (primary) hypertension: Secondary | ICD-10-CM | POA: Diagnosis not present

## 2022-07-19 DIAGNOSIS — C3411 Malignant neoplasm of upper lobe, right bronchus or lung: Secondary | ICD-10-CM | POA: Diagnosis not present

## 2022-07-19 DIAGNOSIS — M5136 Other intervertebral disc degeneration, lumbar region: Secondary | ICD-10-CM | POA: Diagnosis not present

## 2022-07-19 DIAGNOSIS — E1165 Type 2 diabetes mellitus with hyperglycemia: Secondary | ICD-10-CM | POA: Diagnosis not present

## 2022-07-30 DIAGNOSIS — R791 Abnormal coagulation profile: Secondary | ICD-10-CM | POA: Diagnosis not present

## 2022-08-05 ENCOUNTER — Ambulatory Visit: Payer: PPO | Attending: Pain Medicine | Admitting: Pain Medicine

## 2022-08-05 ENCOUNTER — Encounter: Payer: Self-pay | Admitting: Pain Medicine

## 2022-08-05 VITALS — BP 151/80 | HR 92 | Temp 98.1°F | Resp 18 | Ht 63.0 in | Wt 201.0 lb

## 2022-08-05 DIAGNOSIS — Z6835 Body mass index (BMI) 35.0-35.9, adult: Secondary | ICD-10-CM

## 2022-08-05 DIAGNOSIS — M4310 Spondylolisthesis, site unspecified: Secondary | ICD-10-CM | POA: Insufficient documentation

## 2022-08-05 DIAGNOSIS — M5137 Other intervertebral disc degeneration, lumbosacral region: Secondary | ICD-10-CM | POA: Diagnosis not present

## 2022-08-05 DIAGNOSIS — M47816 Spondylosis without myelopathy or radiculopathy, lumbar region: Secondary | ICD-10-CM | POA: Diagnosis not present

## 2022-08-05 DIAGNOSIS — M4306 Spondylolysis, lumbar region: Secondary | ICD-10-CM | POA: Diagnosis not present

## 2022-08-05 DIAGNOSIS — Z7901 Long term (current) use of anticoagulants: Secondary | ICD-10-CM | POA: Diagnosis not present

## 2022-08-05 DIAGNOSIS — M545 Low back pain, unspecified: Secondary | ICD-10-CM | POA: Diagnosis not present

## 2022-08-05 DIAGNOSIS — G8929 Other chronic pain: Secondary | ICD-10-CM | POA: Diagnosis not present

## 2022-08-05 DIAGNOSIS — Z6841 Body Mass Index (BMI) 40.0 and over, adult: Secondary | ICD-10-CM | POA: Diagnosis not present

## 2022-08-05 DIAGNOSIS — M4317 Spondylolisthesis, lumbosacral region: Secondary | ICD-10-CM

## 2022-08-05 NOTE — Patient Instructions (Addendum)
Facet Joint Block The facet joints connect the bones of the spine (vertebrae). They let you bend, twist, and make other movements with your spine. They also keep you from bending too far, twisting too far, and making other extreme movements. A facet joint block is a procedure where a numbing medicine (local anesthesia) is injected into a facet joint. Many times, a medicine for inflammation (steroid) is also injected. A facet joint block may be done: To diagnose neck or back pain. If the pain gets better after a facet joint block, the pain is likely coming from the facet joint. If the pain does not get better, the pain is likely not coming from the facet joint. To treat neck or back pain caused by an inflamed facet joint. To help you with physical therapy or other rehab (rehabilitation) exercises. Tell a health care provider about: Any allergies you have. All medicines you are taking, including vitamins, herbs, eye drops, creams, and over-the-counter medicines. Any problems you or family members have had with anesthesia. Any bleeding problems you have. Any surgeries you have had. Any medical conditions you have or have had. Whether you are pregnant or may be pregnant. What are the risks? Your health care provider will talk with you about risks. These may include: Infection. Allergic reactions to medicines or dyes. Bleeding. Injury to a nerve near where the needle was put in (injection site). Pain at the injection site. Short-term weakness or numbness in areas near the nerves at the injection site. What happens before the procedure? When to stop eating and drinking Follow instructions from your health care provider about what you may eat and drink. Medicines Ask your health care provider about: Changing or stopping your regular medicines. These include any diabetes medicines or blood thinners you take. Taking medicines such as aspirin and ibuprofen. These medicines can thin your blood. Do  not take these medicines unless your health care provider tells you to. Taking over-the-counter medicines, vitamins, herbs, and supplements. General instructions If you will be going home right after the procedure, plan to have a responsible adult: Take you home from the hospital or clinic. You will not be allowed to drive. Care for you for the time you are told. Ask your health care provider: How your injection site will be marked. What steps will be taken to help prevent infection. These may include washing skin with a soap that kills germs. What happens during the procedure?  An IV will be inserted into one of your veins. You will lie on your stomach on an X-ray table. You may be asked to lie in a different position if you will be getting an injection in your neck. Your injection site will be cleaned with a soap that kills germs and then covered with a germ-free (sterile) drape. A local anesthesia will be put in at the injection site. A type of X-ray machine (fluoroscopy) or CT scan will be used to help find your facet joint. A contrast dye may also be injected into your joint to help show if the needle is at the joint. When your provider knows the needle is at your joint, they will inject anesthesia and anti-inflammatory medicine as needed. The needle will be removed. Pressure will be applied to keep your injection site from bleeding. A bandage (dressing) will be placed over each injection site. The procedure may vary among health care providers and hospitals. What happens after the procedure? Your blood pressure, heart rate, breathing rate, and blood oxygen level   will be monitored until you leave the hospital or clinic. This information is not intended to replace advice given to you by your health care provider. Make sure you discuss any questions you have with your health care provider. Document Revised: 11/20/2021 Document Reviewed: 11/20/2021 Elsevier Patient Education  2023  [employer]  ______________________________________________________________________  Procedure instructions  Do not eat or drink fluids (other than water) for 6 hours before your procedure  No water for 2 hours before your procedure  Take your blood pressure medicine with a sip of water  Arrive 30 minutes before your appointment  Carefully read the "Preparing for your procedure" detailed instructions  If you have questions call us at [phone]  _____________________________________________________________________    ______________________________________________________________________  Preparing for your procedure  Appointments: If you think you may not be able to keep your appointment, call 24-48 hours in advance to cancel. We need time to make it available to others.  During your procedure appointment there will be: No Prescription Refills. No disability issues to discussed. No medication changes or discussions.  Instructions: Food intake: Avoid eating anything solid for at least 8 hours prior to your procedure. Clear liquid intake: You may take clear liquids such as water up to 2 hours prior to your procedure. (No carbonated drinks. No soda.) Transportation: Unless otherwise stated by your physician, bring a driver. Morning Medicines: Except for blood thinners, take all of your other morning medications with a sip of water. Make sure to take your heart and blood pressure medicines. If your blood pressure's lower number is above 100, the case will be rescheduled. Blood thinners: Make sure to stop your blood thinners as instructed.  If you take a blood thinner, but were not instructed to stop it, call our office [phone] and ask to talk to a nurse. Not stopping a blood thinner prior to certain procedures could lead to serious complications. Diabetics on insulin: Notify the staff so that you can be scheduled 1st case in the morning. If your diabetes requires high  dose insulin, take only  of your normal insulin dose the morning of the procedure and notify the staff that you have done so. Preventing infections: Shower with an antibacterial soap the morning of your procedure.  Build-up your immune system: Take 1000 mg of Vitamin C with every meal (3 times a day) the day prior to your procedure. Antibiotics: Inform the nursing staff if you are taking any antibiotics or if you have any conditions that may require antibiotics prior to procedures. (Example: recent joint implants)   Pregnancy: If you are pregnant make sure to notify the nursing staff. Not doing so may result in injury to the fetus, including death.  Sickness: If you have a cold, fever, or any active infections, call and cancel or reschedule your procedure. Receiving steroids while having an infection may result in complications. Arrival: You must be in the facility at least 30 minutes prior to your scheduled procedure. Tardiness: Your scheduled time is also the cutoff time. If you do not arrive at least 15 minutes prior to your procedure, you will be rescheduled.  Children: Do not bring any children with you. Make arrangements to keep them home. Dress appropriately: There is always a possibility that your clothing may get soiled. Avoid long dresses. Valuables: Do not bring any jewelry or valuables.  Reasons to call and reschedule or cancel your procedure: (Following these recommendations will minimize the risk of a serious complication.) Surgeries: Avoid having procedures within   2 weeks of any surgery. (Avoid for 2 weeks before or after any surgery). Flu Shots: Avoid having procedures within 2 weeks of a flu shots or . (Avoid for 2 weeks before or after immunizations). Barium: Avoid having a procedure within 7-10 days after having had a radiological study involving the use of radiological contrast. (Myelograms, Barium swallow or enema study). Heart attacks: Avoid any elective procedures or surgeries  for the initial 6 months after a "Myocardial Infarction" (Heart Attack). Blood thinners: It is imperative that you stop these medications before procedures. Let us know if you if you take any blood thinner.  Infection: Avoid procedures during or within two weeks of an infection (including chest colds or gastrointestinal problems). Symptoms associated with infections include: Localized redness, fever, chills, night sweats or profuse sweating, burning sensation when voiding, cough, congestion, stuffiness, runny nose, sore throat, diarrhea, nausea, vomiting, cold or Flu symptoms, recent or current infections. It is specially important if the infection is over the area that we intend to treat. Heart and lung problems: Symptoms that may suggest an active cardiopulmonary problem include: cough, chest pain, breathing difficulties or shortness of breath, dizziness, ankle swelling, uncontrolled high or unusually low blood pressure, and/or palpitations. If you are experiencing any of these symptoms, cancel your procedure and contact your primary care physician for an evaluation.  Remember:  Regular Business hours are:  Monday to Thursday 8:00 AM to 4:00 PM  Provider's Schedule: Francisco Naveira, MD:  Procedure days: Tuesday and Thursday 7:30 AM to 4:00 PM  Bilal Lateef, MD:  Procedure days: Monday and Wednesday 7:30 AM to 4:00 PM  ______________________________________________________________________    ____________________________________________________________________________________________  General Risks and Possible Complications  Patient Responsibilities: It is important that you read this as it is part of your informed consent. It is our duty to inform you of the risks and possible complications associated with treatments offered to you. It is your responsibility as a patient to read this and to ask questions about anything that is not clear or that you believe was not covered in this  document.  Patient's Rights: You have the right to refuse treatment. You also have the right to change your mind, even after initially having agreed to have the treatment done. However, under this last option, if you wait until the last second to change your mind, you may be charged for the materials used up to that point.  Introduction: Medicine is not an exact science. Everything in Medicine, including the lack of treatment(s), carries the potential for danger, harm, or loss (which is by definition: Risk). In Medicine, a complication is a secondary problem, condition, or disease that can aggravate an already existing one. All treatments carry the risk of possible complications. The fact that a side effects or complications occurs, does not imply that the treatment was conducted incorrectly. It must be clearly understood that these can happen even when everything is done following the highest safety standards.  No treatment: You can choose not to proceed with the proposed treatment alternative. The "PRO(s)" would include: avoiding the risk of complications associated with the therapy. The "CON(s)" would include: not getting any of the treatment benefits. These benefits fall under one of three categories: diagnostic; therapeutic; and/or palliative. Diagnostic benefits include: getting information which can ultimately lead to improvement of the disease or symptom(s). Therapeutic benefits are those associated with the successful treatment of the disease. Finally, palliative benefits are those related to the decrease of the primary symptoms, without necessarily curing   the condition (example: decreasing the pain from a flare-up of a chronic condition, such as incurable terminal cancer).  General Risks and Complications: These are associated to most interventional treatments. They can occur alone, or in combination. They fall under one of the following six (6) categories: no benefit or worsening of symptoms;  bleeding; infection; nerve damage; allergic reactions; and/or death. No benefits or worsening of symptoms: In Medicine there are no guarantees, only probabilities. No healthcare provider can ever guarantee that a medical treatment will work, they can only state the probability that it may. Furthermore, there is always the possibility that the condition may worsen, either directly, or indirectly, as a consequence of the treatment. Bleeding: This is more common if the patient is taking a blood thinner, either prescription or over the counter (example: Goody Powders, Fish oil, Aspirin, Garlic, etc.), or if suffering a condition associated with impaired coagulation (example: Hemophilia, cirrhosis of the liver, low platelet counts, etc.). However, even if you do not have one on these, it can still happen. If you have any of these conditions, or take one of these drugs, make sure to notify your treating physician. Infection: This is more common in patients with a compromised immune system, either due to disease (example: diabetes, cancer, human immunodeficiency virus [HIV], etc.), or due to medications or treatments (example: therapies used to treat cancer and rheumatological diseases). However, even if you do not have one on these, it can still happen. If you have any of these conditions, or take one of these drugs, make sure to notify your treating physician. Nerve Damage: This is more common when the treatment is an invasive one, but it can also happen with the use of medications, such as those used in the treatment of cancer. The damage can occur to small secondary nerves, or to large primary ones, such as those in the spinal cord and brain. This damage may be temporary or permanent and it may lead to impairments that can range from temporary numbness to permanent paralysis and/or brain death. Allergic Reactions: Any time a substance or material comes in contact with our body, there is the possibility of an  allergic reaction. These can range from a mild skin rash (contact dermatitis) to a severe systemic reaction (anaphylactic reaction), which can result in death. Death: In general, any medical intervention can result in death, most of the time due to an unforeseen complication. ____________________________________________________________________________________________    ____________________________________________________________________________________________  Blood Thinners  IMPORTANT NOTICE:  If you take any of these, make sure to notify the nursing staff.  Failure to do so may result in injury.  Recommended time intervals to stop and restart blood-thinners, before & after invasive procedures  Generic Name Brand Name Pre-procedure. Stop this long before procedure. Post-procedure. Minimum waiting period before restarting.  Abciximab Reopro 15 days 2 hrs  Alteplase Activase 10 days 10 days  Anagrelide Agrylin    Apixaban Eliquis 3 days 6 hrs  Cilostazol Pletal 3 days 5 hrs  Clopidogrel Plavix 7-10 days 2 hrs  Dabigatran Pradaxa 5 days 6 hrs  Dalteparin Fragmin 24 hours 4 hrs  Dipyridamole Aggrenox 11days 2 hrs  Edoxaban Lixiana; Savaysa 3 days 2 hrs  Enoxaparin  Lovenox 24 hours 4 hrs  Eptifibatide Integrillin 8 hours 2 hrs  Fondaparinux  Arixtra 72 hours 12 hrs  Hydroxychloroquine Plaquenil 11 days   Prasugrel Effient 7-10 days 6 hrs  Reteplase Retavase 10 days 10 days  Rivaroxaban Xarelto 3 days 6 hrs  Ticagrelor Brilinta   5-7 days 6 hrs  Ticlopidine Ticlid 10-14 days 2 hrs  Tinzaparin Innohep 24 hours 4 hrs  Tirofiban Aggrastat 8 hours 2 hrs  Warfarin Coumadin 5 days 2 hrs   Other medications with blood-thinning effects  Product indications Generic (Brand) names Note  Cholesterol Lipitor Stop 4 days before procedure  Blood thinner (injectable) Heparin (LMW or LMWH Heparin) Stop 24 hours before procedure  Cancer Ibrutinib (Imbruvica) Stop 7 days before procedure   Malaria/Rheumatoid Hydroxychloroquine (Plaquenil) Stop 11 days before procedure  Thrombolytics  10 days before or after procedures   Over-the-counter (OTC) Products with blood-thinning effects  Product Common names Stop Time  Aspirin > 325 mg Goody Powders, Excedrin, etc. 11 days  Aspirin ? 81 mg  7 days  Fish oil  4 days  Garlic supplements  7 days  Ginkgo biloba  36 hours  Ginseng  24 hours  NSAIDs Ibuprofen, Naprosyn, etc. 3 days  Vitamin E  4 days   ____________________________________________________________________________________________    

## 2022-08-05 NOTE — Progress Notes (Signed)
PROVIDER NOTE: Information contained herein reflects review and annotations entered in association with encounter. Interpretation of such information and data should be left to medically-trained personnel. Information provided to patient can be located elsewhere in the medical record under "Patient Instructions". Document created using STT-dictation technology, any transcriptional errors that may result from process are unintentional.    Patient: Kara [name]  Service Category: E/M  Provider: Gaspar Cola, MD  DOB: 11-07-1950  DOS: 08/05/2022  Referring Provider: Tracie Harrier, MD  MRN: [id number]  Specialty: Interventional Pain Management  PCP: Tracie Harrier, MD  Type: Established Patient  Setting: Ambulatory outpatient    Location: Office  Delivery: Face-to-face     HPI  [name], a 72 y.o. year old female, is here today because of her Chronic bilateral low back pain without sciatica [M54.50, G89.29]. [name] primary complain today is Back Pain (low)  Pertinent problems: 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); Lumbar pars defect (L3 and L4) (Bilateral); Diabetic peripheral neuropathy (Kara [name]); Chronic lower extremity pain (2ry area of Pain) (Bilateral); Chronic radicular pain of lower extremity; Osteoarthritis of hip (Right); Chronic low back pain (1ry area of Pain) (Bilateral) w/o sciatica; Spondylosis without  myelopathy or radiculopathy, lumbosacral region; Other specified dorsopathies, sacral and sacrococcygeal region; Secondary osteoarthritis of multiple sites; Sacral insufficiency fracture; DDD (degenerative disc disease), lumbosacral; Coccygodynia; Chronic sacroiliac joint pain (Left); Osteoarthritis of lumbar spine; Spondylolisthesis, lumbosacral region; Abnormal MRI, lumbar spine (08/09/2019); Spasm of muscle of lower back; Chronic hip pain (Bilateral) (R>L); Osteoarthritis of hips (Bilateral); Greater trochanteric bursitis (Right); Chronic hip pain (Right); Chronic low back pain (Right) w/o sciatica; Greater trochanteric bursitis (Left); and Other bursitis of hip, not elsewhere classified (gluteus medius bursa) (Left) on their pertinent problem list. Pain Assessment: Severity of Chronic pain is reported as a 10-Worst pain ever/10. Location: Back Lower/radiates into right hip and leg to calf in the back. Onset: More than a month ago. Quality: Sharp. Timing: Constant. Modifying factor(s): laying down. Vitals:  height is '5\' 3"'$  (1.6 m) and weight is 201 lb (91.2 kg). Her temperature is 98.1 F (36.7 C). Her blood pressure is 151/80 (abnormal) and her pulse is 92. Her respiration is 18 and oxygen saturation is 97%.  BMI: Estimated body mass index is 35.61 kg/m as calculated from the following:   Height as of this encounter: '5\' 3"'$  (1.6 m).   Weight as of this encounter: 201 lb (91.2 kg). Last encounter: 07/01/2022. Last procedure: 06/15/2022. (51 days ago) (33mo 08/14/22) (bilateral lumbar facet medial branch block)  Reason for encounter: evaluation of worsening, or previously known (established) problem.  Unfortunately, the patient's son died this past week unexpectedly.  She is obviously depressed, but she is having also an exacerbation of the right-sided low back pain.  She indicates the pain in the lower back to be the worst.  She also has some referred pain through the back of the leg that stops at mid calf.   She indicates no pain or weakness below the knee.  Hyperextension and rotation maneuver were positive for  bilateral lumbar facet arthralgia with the right side being worse than the left.  Hyperextension and rotation maneuver was concordant with the results of the [facility] maneuver.  Based on today's findings, we will go ahead and proceed with a repeat right-sided lumbar facet RFA #2.  If by any chance we cannot secure a prior authorization in a timely manner, then we will schedule the patient to first come in for a right-sided lumbar facet medial branch block.  These continue to provide the patient with good relief of the pain.  Unfortunately that on last.  The radiofrequency seems to have lasted the longest.  Note: The patient has been warned on multiple occasions about the side effects and adverse reactions associated with the long-term use of steroids.  She has also been warned about the problems associated with persistent BMI above 30 kg/m.  On multiple occasions the patient has been referred to bariatric surgery and medical weight management to get appropriate assistance with bringing her weight down.  She has a significant anterolisthesis of L4 over L5 and of L5 over S1 with increased risk of spinal instability and lower extremity nerve root impingements, both central and foraminal.  The patient has been told to work on bringing her weight down since 2019, at which time it was 218 pounds at a height of 5 feet 3 inches tall.  Her most recent weight was from 07/01/2022 and at that time she had a weight of 202 pounds.  In the past 5 years she has only lost 16 pounds.  Pharmacotherapy Assessment  Analgesic: Oxycodone IR 10 mg, 1 tab PO qd,  from PCP. No chronic opioid analgesics therapy prescribed by our practice, 2ry to Medication Agreement Violation. (Getting oxycodone from PCP in multiple occasions, according to PMP)(11/21/20; 12/26/20; 01/16/21; 02/13/21). Opioid analgesic pharmacotherapy D/C'ed on  03/03/2021. MME: 20 mg/day.   Monitoring: [place] PMP: PDMP reviewed during this encounter.       Pharmacotherapy: No side-effects or adverse reactions reported. Compliance: No problems identified. Effectiveness: Clinically acceptable.  Dewayne Shorter, RN  08/05/2022  1:50 PM  Sign when Signing Visit Safety precautions to be maintained throughout the outpatient stay will include: orient to surroundings, keep bed in low position, maintain call bell within reach at all times, provide assistance with transfer out of bed and ambulation.    No results found for: "CBDTHCR" No results found for: "D8THCCBX" No results found for: "D9THCCBX"  UDS:  Summary  Date Value Ref Range Status  11/12/2020 Note  Final    Comment:    ==================================================================== [employer] Select 13 (MW) ==================================================================== Test                             Result       Flag       Units  Drug Present and Declared for Prescription Verification   Norhydrocodone                 61           EXPECTED   ng/mg creat    Norhydrocodone is an expected metabolite of hydrocodone.    Tramadol                       >4202        EXPECTED   ng/mg creat   O-Desmethyltramadol            >4202  EXPECTED   ng/mg creat   N-Desmethyltramadol            1712         EXPECTED   ng/mg creat    Source of tramadol is a prescription medication. O-desmethyltramadol    and N-desmethyltramadol are expected metabolites of tramadol.  Drug Absent but Declared for Prescription Verification   Hydrocodone                    Not Detected UNEXPECTED ng/mg creat    Hydrocodone is almost always present in patients taking this drug    consistently. Absence of hydrocodone could be due to lapse of time    since the last dose or unusual pharmacokinetics (rapid metabolism).  ==================================================================== Test                       Result    Flag   Units      Ref Range   Creatinine              119              mg/dL      >=20 ==================================================================== Declared Medications:  The flagging and interpretation on this report are based on the  following declared medications.  Unexpected results may arise from  inaccuracies in the declared medications.   **Note: The testing scope of this panel includes these medications:   Hydrocodone  Tramadol   **Note: The testing scope of this panel does not include the  following reported medications:   Acetaminophen  Alendronate (Fosamax)  Aspirin  Calcium  Candesartan  Cholecalciferol  Cyanocobalamin  Cyclobenzaprine  Dulaglutide  Gabapentin  Glipizide  Meclizine  Multivitamin  Nystatin  Ondansetron  Ropinirole  Tolterodine  Warfarin ==================================================================== For clinical consultation, please call [phone]. ====================================================================       ROS  Constitutional: Denies any fever or chills Gastrointestinal: No reported hemesis, hematochezia, vomiting, or acute GI distress Musculoskeletal: Denies any acute onset joint swelling, redness, loss of ROM, or weakness Neurological: No reported episodes of acute onset apraxia, aphasia, dysarthria, agnosia, amnesia, paralysis, loss of coordination, or loss of consciousness  Medication Review  Cholecalciferol, Dulaglutide, HYDROcodone-acetaminophen, Magnesium Oxide -Mg Supplement, Vitamin B-12, alendronate, calcium carbonate, candesartan, cyclobenzaprine, gabapentin, glipiZIDE, meclizine, multivitamin with minerals, oxybutynin, rOPINIRole, and warfarin  History Review  Allergy: Ms. Cormany is allergic to adhesive [tape], other, latex, and morphine and related. Drug: Ms. Jochem  reports no history of drug use. Alcohol:  reports no history of alcohol use. Tobacco:  reports that she has  never smoked. She has never used smokeless tobacco. Social: Ms. Bartlett  reports that she has never smoked. She has never used smokeless tobacco. She reports that she does not drink alcohol and does not use drugs. 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 Surgical: Ms. Methot  has a past surgical history that includes Knee surgery; Carpal tunnel release (Bilateral); Colon resection sigmoid (N/A, 11/02/2016); Colostomy (N/A, 11/02/2016); Sigmoidoscopy (N/A, 11/13/2016); PULMONARY VENOGRAPHY (N/A, 11/19/2016); Colonoscopy with propofol (N/A, 02/03/2017); Colon surgery (11/02/2016); IVC FILTER INSERTION (Right, 10/2016); Colostomy reversal (N/A, 02/24/2017); Appendectomy (N/A, 02/24/2017); Lysis of adhesion (N/A, 02/24/2017); laparoscopy (N/A, 02/24/2017); I[place]o loop diversion (N/A, 02/24/2017); Sigmoidoscopy (N/A, 05/19/2017); Ileostomy closure (N/A, 06/01/2017); IVC FILTER REMOVAL (N/A,  08/09/2017); Sacroplasty (N/A, 11/08/2017); Cataract extraction w/PHACO (Left, 11/21/2018); Cataract extraction w/PHACO (Right, 12/12/2018); and Video bronchoscopy with endobronchial navigation (N/A, 02/23/2021). Family: family history includes Breast cancer in her mother; Cancer in her mother; Diabetes in her mother and sister; Heart disease in her father, mother, and sister.  Laboratory Chemistry Profile   Renal Lab Results  Component Value Date   BUN 10 11/22/2021   CREATININE 0.70 06/21/2022   GFRAA >60 07/09/2019   GFRNONAA >60 11/22/2021    Hepatic Lab Results  Component Value Date   AST 25 11/20/2021   ALT 31 11/20/2021   ALBUMIN 3.4 (L) 11/20/2021   ALKPHOS 38 11/20/2021   LIPASE 22 11/20/2021    Electrolytes Lab Results  Component Value  Date   NA 142 11/22/2021   K 3.7 11/22/2021   CL 112 (H) 11/22/2021   CALCIUM 7.7 (L) 11/22/2021   MG 1.7 08/14/2020   PHOS 3.3 11/07/2016    Bone Lab Results  Component Value Date   25OHVITD1 25 (L) 11/02/2017   25OHVITD2 <1.0 11/02/2017   25OHVITD3 25 11/02/2017    Inflammation (CRP: Acute Phase) (ESR: Chronic Phase) Lab Results  Component Value Date   CRP 1.7 (H) 08/15/2020   ESRSEDRATE 12 11/02/2017   LATICACIDVEN 1.7 11/21/2021         Note: Above Lab results reviewed.  Recent Imaging Review  CT Chest W Contrast CLINICAL DATA:  RIGHT upper lobe lung cancer in a 72 year old female.  * Tracking Code: BO *  EXAM: CT CHEST WITH CONTRAST  TECHNIQUE: Multidetector CT imaging of the chest was performed during intravenous contrast administration.  RADIATION DOSE REDUCTION: This exam was performed according to the departmental dose-optimization program which includes automated exposure control, adjustment of the mA and/or kV according to patient size and/or use of iterative reconstruction technique.  CONTRAST:  110m OMNIPAQUE IOHEXOL 300 MG/ML  SOLN  COMPARISON:  November 16, 2021  FINDINGS: Cardiovascular: Stable appearance of the heart great vessels with signs of aortic atherosclerosis.  Mediastinum/Nodes: No adenopathy in the chest. Stable scattered small lymph nodes throughout the chest. Esophagus grossly normal.  Lungs/Pleura: Bandlike scarring extends from the RIGHT hilum. Areas of consolidative change and bronchiectasis in the central lung with similar appearance. No signs of pleural effusion or new suspicious pulmonary nodule. No new areas of consolidation. Airways removed from post treatment changes about the RIGHT hilum are patent. Stable 3 mm LEFT upper lobe nodule (image 53//3)  Upper Abdomen: Incidental imaging of upper abdominal contents shows no acute process. There is cholelithiasis and at least moderate hepatic steatosis. Adrenal glands are  normal.  Musculoskeletal: No acute bone finding. No destructive bone process. Spinal degenerative changes.  IMPRESSION: 1. Stable post treatment changes about the RIGHT hilum without evidence of recurrent or metastatic disease. 2. Stable 3 mm LEFT upper lobe nodule. 3. Cholelithiasis and at least moderate hepatic steatosis. 4. Aortic atherosclerosis.  Aortic Atherosclerosis (ICD10-I70.0).  Electronically Signed   By: GZetta BillsM.D.   On: 06/21/2022 16:02 Note: Reviewed        Physical Exam  General appearance: Well nourished, well developed, and well hydrated. In no apparent acute distress Mental status: Alert, oriented x 3 (person, place, & time)       Respiratory: No evidence of acute respiratory distress Eyes: PERLA Vitals: BP (!) 151/80   Pulse 92   Temp 98.1 F (36.7 C)   Resp 18   Ht '5\' 3"'$  (1.6 m)   Wt 201 lb (91.2  kg)   SpO2 97%   BMI 35.61 kg/m  BMI: Estimated body mass index is 35.61 kg/m as calculated from the following:   Height as of this encounter: '5\' 3"'$  (1.6 m).   Weight as of this encounter: 201 lb (91.2 kg). Ideal: Ideal body weight: 52.4 kg (115 lb 8.3 oz) Adjusted ideal body weight: 67.9 kg (149 lb 11.4 oz)  Assessment   Diagnosis Status  1. Chronic low back pain (1ry area of Pain) (Bilateral) w/o sciatica   2. DDD (degenerative disc disease), lumbosacral   3. 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 Lumbar facet syndrome (Bilateral)   5. Lumbar facet arthropathy (L3-4, L4-5, and L5-S1) (Bilateral)   6. 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 Chronic anticoagulation (COUMADIN)   9. Morbid obesity with BMI of 40.0-44.9, adult ([place])    Controlled Controlled Controlled   Updated Problems: Problem  Grade 1-2 Anterolisthesis of L4/L5 & L5/S1 (stable as of 11/05/2021)   (07/21/19) Flexion/Extension Lumbar X-rays:  There is severe disc height loss at the L4-5 and  L5-S1 levels. Noted significant listhesis of L4 over L5 measuring approximately 9-10 mm. There is a 1.4 cm (14 mm) anterolisthesis of L5 over S1. Neither changes significantly with flexion or extension views.  (08/09/2019) Lumbar MRI:  Alignment: Increased grade 1 anterolisthesis of L4 over L5 measuring 7 mm (previously 5 mm). Stable to slightly increased grade 1 anterolisthesis at L5-S1. Vertebrae: Chronic bilateral pars defects at L3 and L4.     Plan of Care  Problem-specific:  No problem-specific Assessment & Plan notes found for this encounter.  Ms. Vicy Brana Apollo has a current medication list which includes the following long-term medication(s): candesartan, glipizide, ropinirole, warfarin, and calcium carbonate.  Pharmacotherapy (Medications Ordered): No orders of the defined types were placed in this encounter.  Orders:  Orders Placed This Encounter  Procedures   LUMBAR FACET(MEDIAL BRANCH NERVE BLOCK) MBNB    Diagnosis: Lumbar Facet Syndrome (M47.816); Lumbosacral Facet Syndrome (M47.817); Lumbar Facet Joint Pain (M54.59) Medical Necessity Statement: 1.Severe chronic axial low back pain causing functional impairment documented by ongoing pain scale assessments. 2.Pain present for longer than 3 months (Chronic) documented to have failed noninvasive conservative therapies. 3.Absence of untreated radiculopathy. 4.There is no radiological evidence of untreated fractures, tumor, infection, or deformity.  Physical Examination Findings: Positive Kemp Maneuver: (Y)  Positive Lumbar Hyperextension-Rotation provocative test: (Y)    Standing Status:   Future    Standing Expiration Date:   11/05/2022    Scheduling Instructions:     Procedure: Lumbar facet Medial Branch Block (MBB)     Side: Right-sided     Level: L3-4, L4-5, and L5-S1 Facets (L2, L3, L4, L5, and S1 Medial Branch)     Sedation: With Sedation.     Timeframe: ASAP    Order Specific Question:   Where will this  procedure be performed?    Answer:   [name]   Radiofrequency,Lumbar    Standing Status:   Future    Standing Expiration Date:   11/05/2022    Scheduling Instructions:     Side(s): Right-sided     Level: L3-4, L4-5, and L5-S1 Facets (L2, L3, L4, L5, and S1 Medial Branch)     Sedation: With Sedation.     Scheduling Timeframe: As soon as pre-approved    Order Specific Question:   Where will this procedure be  performed?    Answer:   [name]   Blood Thinner Instructions to Nursing    Always make sure patient has clearance from prescribing physician to stop blood thinners for interventional therapies. If the patient requires a Lovenox-bridge therapy, make sure arrangements are made to institute it with the assistance of the PCP.    Scheduling Instructions:     Have Ms. Morency stop the Coumadin (Warfarin) X 5 days prior to procedure or surgery.   Follow-up plan:   Return for (ECT): (R) L-FCT RFA #2, (Blood Thinner Protocol).      Interventional Therapies  Risk  Complexity Considerations:   ANTICOAGULATION: Coumadin (Stop: 5 days  Re-start: 2hrs)  Hx DVT & PE ALLERGY: Latex Morbid obesity (class III) ([id number])  metabolic syndrome  NIDDM HTN No Lumbar RFA due to morbid obesity    Planned  Pending:   Therapeutic right lumbar facet RFA #2 (if preapproval cannot be obtained quickly, then bring the patient in for a medial branch block) Palliative bilateral lumbar facet MBB [id number] (1st of 2024) (06/15/2022) (10-9/10)   Under consideration:      Completed:   Therapeutic bilateral lumbar facet MBB (L3-S1) x1 (04/06/2022) (80/80/90/90)  Diagnostic/therapeutic right IA hip inj. x2 (01/28/2022) (100/100/85/85)  Diagnostic/therapeutic left IA hip inj. x1 (01/28/2022) (100/100/85/85)  Diagnostic/therapeutic right trochanteric bursa inj. x1 (09/15/2021) (85/85/85)  Diagnostic/therapeutic left trochanteric bursa inj. x1 (01/28/2022) (100/100/85/85)  Therapeutic left  lumbar facet RFA x1 (07/21/2021) (100/100/100 x1 week/80) (Lasted 9 months)  Therapeutic right lumbar facet RFA x1 (07/07/2021) (100/100/80/80) (Lasted 9 months)  Palliative/Therapeutic left L4-5 LESI x1 (11/10/2017) (100/80/0/0-25)  Diagnostic/therapeutic right L4 TFESI x1 (11/19/2021) (90/80/0/0) (fell at [employer]) Palliative left lumbar facet (L2-S1) MBB x17 (06/15/2022) (100/100/75/85)  Palliative right lumbar facet (L2-S1) MBB x23 (06/15/2022) (100/100/75/85)  Palliative right SI joint block x9 (01/28/2022) (100/100/80/80)  Diagnostic left SI joint block x2 (01/28/2022) (100/100/80/80)  Palliative/Therapeutic (Midline) caudal ESI x3 (06/08/2018) (100/100/100 x 2-3 days/0)    Therapeutic  Palliative (PRN) options:   NO PRN procedures without F2F exam by Dr. Dossie Arbour due to multifactorial etiology of chronic pain. No more procedures until after 08/14/2022.    Pharmacotherapy  Nonopioids transferred 03/27/2020: Calcium       Recent Visits Date Type Provider Dept  07/01/22 Office Visit Milinda Pointer, MD [name]  06/15/22 Procedure visit Milinda Pointer, MD [name]  Showing recent visits within past 90 days and meeting all other requirements Today's Visits Date Type Provider Dept  08/05/22 Office Visit Milinda Pointer, MD [name]  Showing today's visits and meeting all other requirements Future Appointments No visits were found meeting these conditions. Showing future appointments within next 90 days and meeting all other requirements  I discussed the assessment and treatment plan with the patient. The patient was provided an opportunity to ask questions and all were answered. The patient agreed with the plan and demonstrated an understanding of the instructions.  Patient advised to call back or seek an in-person evaluation if the symptoms or condition worsens.  Duration of encounter: 36 minutes.  Total time on encounter, as per  AMA guidelines included both the face-to-face and non-face-to-face time personally spent by the physician and/or other qualified health care professional(s) on the day of the encounter (includes time in activities that require the physician or other qualified health care professional and does not include time in activities normally performed by clinical staff). Physician's time may include the following activities when performed: Preparing to  see the patient (e.g., pre-charting review of records, searching for previously ordered imaging, lab work, and nerve conduction tests) Review of prior analgesic pharmacotherapies. Reviewing PMP Interpreting ordered tests (e.g., lab work, imaging, nerve conduction tests) Performing post-procedure evaluations, including interpretation of diagnostic procedures Obtaining and/or reviewing separately obtained history Performing a medically appropriate examination and/or evaluation Counseling and educating the patient/family/caregiver Ordering medications, tests, or procedures Referring and communicating with other health care professionals (when not separately reported) Documenting clinical information in the electronic or other health record Independently interpreting results (not separately reported) and communicating results to the patient/ family/caregiver Care coordination (not separately reported)  Note by: Gaspar Cola, MD Date: 08/05/2022; Time: 2:15 PM

## 2022-08-05 NOTE — Progress Notes (Signed)
Safety precautions to be maintained throughout the outpatient stay will include: orient to surroundings, keep bed in low position, maintain call bell within reach at all times, provide assistance with transfer out of bed and ambulation.  

## 2022-08-09 DIAGNOSIS — R791 Abnormal coagulation profile: Secondary | ICD-10-CM | POA: Diagnosis not present

## 2022-08-09 DIAGNOSIS — E119 Type 2 diabetes mellitus without complications: Secondary | ICD-10-CM | POA: Diagnosis not present

## 2022-08-09 DIAGNOSIS — Z86711 Personal history of pulmonary embolism: Secondary | ICD-10-CM | POA: Diagnosis not present

## 2022-08-09 DIAGNOSIS — H43812 Vitreous degeneration, left eye: Secondary | ICD-10-CM | POA: Diagnosis not present

## 2022-08-09 DIAGNOSIS — Z961 Presence of intraocular lens: Secondary | ICD-10-CM | POA: Diagnosis not present

## 2022-08-09 DIAGNOSIS — Z7901 Long term (current) use of anticoagulants: Secondary | ICD-10-CM | POA: Diagnosis not present

## 2022-08-17 ENCOUNTER — Ambulatory Visit: Payer: PPO | Attending: Pain Medicine | Admitting: Pain Medicine

## 2022-08-17 ENCOUNTER — Encounter: Payer: Self-pay | Admitting: Pain Medicine

## 2022-08-17 ENCOUNTER — Ambulatory Visit
Admission: RE | Admit: 2022-08-17 | Discharge: 2022-08-17 | Disposition: A | Discharge status: Home / Self Care | Payer: PPO | Source: Ambulatory Visit | Attending: Pain Medicine | Admitting: Pain Medicine

## 2022-08-17 VITALS — BP 118/85 | HR 88 | Temp 97.7°F | Resp 16 | Ht 63.0 in | Wt 201.0 lb

## 2022-08-17 DIAGNOSIS — R937 Abnormal findings on diagnostic imaging of other parts of musculoskeletal system: Secondary | ICD-10-CM | POA: Diagnosis not present

## 2022-08-17 DIAGNOSIS — M47816 Spondylosis without myelopathy or radiculopathy, lumbar region: Secondary | ICD-10-CM

## 2022-08-17 DIAGNOSIS — M48061 Spinal stenosis, lumbar region without neurogenic claudication: Secondary | ICD-10-CM

## 2022-08-17 DIAGNOSIS — Z7901 Long term (current) use of anticoagulants: Secondary | ICD-10-CM

## 2022-08-17 DIAGNOSIS — M545 Low back pain, unspecified: Secondary | ICD-10-CM | POA: Diagnosis not present

## 2022-08-17 DIAGNOSIS — Z91040 Latex allergy status: Secondary | ICD-10-CM | POA: Diagnosis not present

## 2022-08-17 DIAGNOSIS — G8918 Other acute postprocedural pain: Secondary | ICD-10-CM | POA: Diagnosis not present

## 2022-08-17 DIAGNOSIS — M5137 Other intervertebral disc degeneration, lumbosacral region: Secondary | ICD-10-CM | POA: Insufficient documentation

## 2022-08-17 DIAGNOSIS — M51379 Other intervertebral disc degeneration, lumbosacral region without mention of lumbar back pain or lower extremity pain: Secondary | ICD-10-CM

## 2022-08-17 DIAGNOSIS — M4310 Spondylolisthesis, site unspecified: Secondary | ICD-10-CM | POA: Diagnosis not present

## 2022-08-17 DIAGNOSIS — G8929 Other chronic pain: Secondary | ICD-10-CM | POA: Diagnosis not present

## 2022-08-17 DIAGNOSIS — M4807 Spinal stenosis, lumbosacral region: Secondary | ICD-10-CM | POA: Diagnosis not present

## 2022-08-17 DIAGNOSIS — M4306 Spondylolysis, lumbar region: Secondary | ICD-10-CM | POA: Diagnosis not present

## 2022-08-17 DIAGNOSIS — Z6841 Body Mass Index (BMI) 40.0 and over, adult: Secondary | ICD-10-CM | POA: Diagnosis not present

## 2022-08-17 DIAGNOSIS — M47817 Spondylosis without myelopathy or radiculopathy, lumbosacral region: Secondary | ICD-10-CM | POA: Diagnosis not present

## 2022-08-17 MED ORDER — PENTAFLUOROPROP-TETRAFLUOROETH EX AERO
INHALATION_SPRAY | Freq: Once | CUTANEOUS | Status: AC
  Administered 2022-08-17: 30 via TOPICAL

## 2022-08-17 MED ORDER — LIDOCAINE HCL 2 % IJ SOLN
20.0000 mL | Freq: Once | INTRAMUSCULAR | Status: AC
  Administered 2022-08-17: 400 mg
  Filled 2022-08-17: qty 20

## 2022-08-17 MED ORDER — LACTATED RINGERS IV SOLN: Freq: Once | INTRAVENOUS | Status: AC

## 2022-08-17 MED ORDER — KETOROLAC TROMETHAMINE 60 MG/2ML IM SOLN
60.0000 mg | Freq: Once | INTRAMUSCULAR | Status: AC
  Administered 2022-08-17: 60 mg via INTRAMUSCULAR

## 2022-08-17 MED ORDER — METHOCARBAMOL 1000 MG/10ML IJ SOLN
INTRAMUSCULAR | Status: AC
  Filled 2022-08-17: qty 10

## 2022-08-17 MED ORDER — KETOROLAC TROMETHAMINE 60 MG/2ML IM SOLN
INTRAMUSCULAR | Status: AC
  Filled 2022-08-17: qty 2

## 2022-08-17 MED ORDER — FENTANYL CITRATE (PF) 100 MCG/2ML IJ SOLN: 25.0000 ug | INTRAMUSCULAR | Status: DC | PRN

## 2022-08-17 MED ORDER — METHOCARBAMOL 1000 MG/10ML IJ SOLN
200.0000 mg | Freq: Once | INTRAMUSCULAR | Status: AC
  Administered 2022-08-17: 1000 mg via INTRAMUSCULAR

## 2022-08-17 MED ORDER — ROPIVACAINE HCL 2 MG/ML IJ SOLN
9.0000 mL | Freq: Once | INTRAMUSCULAR | Status: AC
  Administered 2022-08-17: 9 mL via PERINEURAL
  Filled 2022-08-17: qty 20

## 2022-08-17 MED ORDER — MIDAZOLAM HCL 2 MG/2ML IJ SOLN
INTRAMUSCULAR | Status: AC
  Filled 2022-08-17: qty 2

## 2022-08-17 MED ORDER — MIDAZOLAM HCL 5 MG/5ML IJ SOLN
0.5000 mg | Freq: Once | INTRAMUSCULAR | Status: AC
  Administered 2022-08-17: 2 mg via INTRAVENOUS

## 2022-08-17 MED ORDER — TRIAMCINOLONE ACETONIDE 40 MG/ML IJ SUSP
40.0000 mg | Freq: Once | INTRAMUSCULAR | Status: AC
  Administered 2022-08-17: 40 mg
  Filled 2022-08-17: qty 1

## 2022-08-17 NOTE — Progress Notes (Signed)
PROVIDER NOTE: Interpretation of information contained herein should be left to medically-trained personnel. Specific patient instructions are provided elsewhere under "Patient Instructions" section of medical record. This document was created in part using STT-dictation technology, any transcriptional errors that may result from this process are unintentional.  Patient: Kara [name] Type: Established DOB: [birth year] MRN: [id number] PCP: Tracie Harrier, MD  Service: Procedure DOS: 08/17/2022 Setting: Ambulatory Location: Ambulatory outpatient facility Delivery: Face-to-face Provider: Gaspar Cola, MD Specialty: Interventional Pain Management Specialty designation: 09 Location: Outpatient facility Ref. Prov.: Tracie Harrier, MD       Interventional Therapy   Procedure: Lumbar Facet, Medial Branch Block(s) #2 of 2024 (total: #24 since 2019) Laterality: Right  Level: L2, L3, L4, L5, and S1 Medial Branch Level(s). Injecting these levels blocks the L3-4, L4-5, and L5-S1 lumbar facet joints. Imaging: Fluoroscopic guidance         Anesthesia: Local anesthesia (1-2% Lidocaine) Anxiolysis: IV Versed 2.0 mg Sedation: No Sedation                       DOS: 08/17/2022 Performed by: Gaspar Cola, MD  Primary Purpose: Diagnostic/Therapeutic Indications: Low back pain severe enough to impact quality of life or function. 1. Lumbar facet syndrome (Bilateral)   2. Lumbar facet arthropathy (L3-4, L4-5, and L5-S1) (Bilateral)   3. Spondylosis without myelopathy or radiculopathy, lumbosacral region   4. Grade 1-2 Anterolisthesis of L4/L5 & L5/S1 (stable as of 11/05/2021)   5. DDD (degenerative disc disease), lumbosacral   6. Chronic low back pain (1ry area of Pain) (Bilateral) w/o sciatica    Chronic anticoagulation (COUMADIN)    Morbid obesity with BMI of 40.0-44.9, adult (HCC)    NAS-11 Pain score:   Pre-procedure: 10-Worst pain ever/10   Post-procedure: 5 /10   Note:  Today I had the patient's schedule an ECT since she had initially requested sedation for the procedure.  On 08/05/2022 she had indicated having primarily pain on the right side of the lower back and we had decided to bring her in for radiofrequency ablation of the right side.  However, she was questioning whether or not it would take long to get a renin so as an alternative I went ahead and put an order for a right-sided lumbar facet block.  At that time I asked her if she wanted me to do both sides and reminded the patient that whenever we schedule a block, we need to know that I had a time and that we no longer can be changing the plan or adding procedures on the actual procedure day without having gotten preapproval for such procedure.  She understood and she said that she only wanted to have the right side done.  Today upon arrival, she then indicated that she wanted to go ahead and have both sides on, but I had already warned her about this and I told her we could not since the schedule would not allow [place] to changes since today we have a fairly busy schedule following her.  We have had long conversations with regards to these procedures and the frequency that she is requesting them and how I would prefer not to be doing these as often.  She has been warned in several locations about the long-term side effects of the steroids.  Today she has decided that she is interested in talking to a neurosurgeon to see if there is anything that they can do.  She requested to see Dr. Cari Caraway  home she had previously seen in the past for similar problems.  Because the patient's last lumbar MRI was done on 05/06/2021, today we have entered an order to have it repeated since it will be essential for the neurosurgical evaluation.  After today's procedure, again the patient requested an IM injection of Toradol and Robaxin.  It has been a while since she had requested them, but it is something that she had gotten into the  habit of doing after procedures.  Review of the patient's last basic metabolic panel done on [id number] indicates her GFR to be within normal limits and therefore I have agreed to that IM injection.  During today's procedure, the patient seems to be more sensitive around the area of the L4-5 and L5-S1 facet joints suggesting that if we need to do a radiofrequency ablation, we may need to concentrate more on those 2 facet joints.    Position / Prep / Materials:  Position: Prone  Prep solution: DuraPrep (Iodine Povacrylex [0.7% available iodine] and Isopropyl Alcohol, 74% w/w) Area Prepped: Posterolateral Lumbosacral Spine (Wide prep: From the lower border of the scapula down to the end of the tailbone and from flank to flank.)  Materials:  Tray: Block Needle(s):  Type: Spinal  Gauge (G): 22  Length: 5-in Qty: 4      Pre-op H&P Assessment:  Kara [name] is a 72 y.o. (year old), female patient, seen today for interventional treatment. She  has a past surgical history that includes Knee surgery; Carpal tunnel release (Bilateral); Colon resection sigmoid (N/A, 11/02/2016); Colostomy (N/A, 11/02/2016); Sigmoidoscopy (N/A, 11/13/2016); PULMONARY VENOGRAPHY (N/A, 11/19/2016); Colonoscopy with propofol (N/A, 02/03/2017); Colon surgery (11/02/2016); IVC FILTER INSERTION (Right, 10/2016); Colostomy reversal (N/A, 02/24/2017); Appendectomy (N/A, 02/24/2017); Lysis of adhesion (N/A, 02/24/2017); laparoscopy (N/A, 02/24/2017); Ileo loop diversion (N/A, 02/24/2017); Sigmoidoscopy (N/A, 05/19/2017); Ileostomy closure (N/A, 06/01/2017); IVC FILTER REMOVAL (N/A, 08/09/2017); Sacroplasty (N/A, 11/08/2017); Cataract extraction w/PHACO (Left, 11/21/2018); Cataract extraction w/PHACO (Right, 12/12/2018); and Video bronchoscopy with endobronchial navigation (N/A, 02/23/2021). Kara [name] has a current medication list which includes the following prescription(s): alendronate, calcium carbonate, candesartan, cholecalciferol, vitamin b-12,  cyclobenzaprine, dulaglutide, gabapentin, gabapentin, glipizide, hydrocodone-acetaminophen, magnesium oxide -mg supplement, meclizine, oxybutynin, ropinirole, and warfarin, and the following Facility-Administered Medications: fentanyl and lactated ringers. Her primarily concern today is the Back Pain (Bilatera lumbar )  Initial Vital Signs:  Pulse/HCG Rate: 89  Temp: 97.7 F (36.5 C) Resp: 16 BP: 131/79 SpO2: 98 %  BMI: Estimated body mass index is 35.61 kg/m as calculated from the following:   Height as of this encounter: 5\' 3"  (1.6 m).   Weight as of this encounter: 201 lb (91.2 kg).  Risk Assessment: Allergies: Reviewed. She is allergic to adhesive [tape], other, latex, and morphine and related.  Allergy Precautions: None required Coagulopathies: Reviewed. None identified.  Blood-thinner therapy: None at this time Active Infection(s): Reviewed. None identified. Ms. Harvan is afebrile  Site Confirmation: Ms. Saif was asked to confirm the procedure and laterality before marking the site Procedure checklist: Completed Consent: Before the procedure and under the influence of no sedative(s), amnesic(s), or anxiolytics, the patient was informed of the treatment options, risks and possible complications. To fulfill our ethical and legal obligations, as recommended by the American Medical Association's Code of Ethics, I have informed the patient of my clinical impression; the nature and purpose of the treatment or procedure; the risks, benefits, and possible complications of the intervention; the alternatives, including doing nothing; the risk(s) and benefit(s) of the alternative treatment(s)  or procedure(s); and the risk(s) and benefit(s) of doing nothing. The patient was provided information about the general risks and possible complications associated with the procedure. These may include, but are not limited to: failure to achieve desired goals, infection, bleeding, organ or nerve  damage, allergic reactions, paralysis, and death. In addition, the patient was informed of those risks and complications associated to Spine-related procedures, such as failure to decrease pain; infection (i.e.: Meningitis, epidural or intraspinal abscess); bleeding (i.e.: epidural hematoma, subarachnoid hemorrhage, or any other type of intraspinal or peri-dural bleeding); organ or nerve damage (i.e.: Any type of peripheral nerve, nerve root, or spinal cord injury) with subsequent damage to sensory, motor, and/or autonomic systems, resulting in permanent pain, numbness, and/or weakness of one or several areas of the body; allergic reactions; (i.e.: anaphylactic reaction); and/or death. Furthermore, the patient was informed of those risks and complications associated with the medications. These include, but are not limited to: allergic reactions (i.e.: anaphylactic or anaphylactoid reaction(s)); adrenal axis suppression; blood sugar elevation that in diabetics may result in ketoacidosis or comma; water retention that in patients with history of congestive heart failure may result in shortness of breath, pulmonary edema, and decompensation with resultant heart failure; weight gain; swelling or edema; medication-induced neural toxicity; particulate matter embolism and blood vessel occlusion with resultant organ, and/or nervous system infarction; and/or aseptic necrosis of one or more joints. Finally, the patient was informed that Medicine is not an exact science; therefore, there is also the possibility of unforeseen or unpredictable risks and/or possible complications that may result in a catastrophic outcome. The patient indicated having understood very clearly. We have given the patient no guarantees and we have made no promises. Enough time was given to the patient to ask questions, all of which were answered to the patient's satisfaction. Ms. Giarraputo has indicated that she wanted to continue with the  procedure. Attestation: I, the ordering provider, attest that I have discussed with the patient the benefits, risks, side-effects, alternatives, likelihood of achieving goals, and potential problems during recovery for the procedure that I have provided informed consent. Date  Time: 08/17/2022 10:34 AM   Pre-Procedure Preparation:  Monitoring: As per clinic protocol. Respiration, ETCO2, SpO2, BP, heart rate and rhythm monitor placed and checked for adequate function Safety Precautions: Patient was assessed for positional comfort and pressure points before starting the procedure. Time-out: I initiated and conducted the "Time-out" before starting the procedure, as per protocol. The patient was asked to participate by confirming the accuracy of the "Time Out" information. Verification of the correct person, site, and procedure were performed and confirmed by me, the nursing staff, and the patient. "Time-out" conducted as per Joint Commission's Universal Protocol (UP.01.01.01). Time: [id number] Start Time: 1118 hrs.  Description of Procedure:          Laterality: (see above) Targeted Levels: (see above)  Safety Precautions: Aspiration looking for blood return was conducted prior to all injections. At no point did we inject any substances, as a needle was being advanced. Before injecting, the patient was told to immediately notify me if she was experiencing any new onset of "ringing in the ears, or metallic taste in the mouth". No attempts were made at seeking any paresthesias. Safe injection practices and needle disposal techniques used. Medications properly checked for expiration dates. SDV (single dose vial) medications used. After the completion of the procedure, all disposable equipment used was discarded in the proper designated medical waste containers. Local Anesthesia: Protocol guidelines were followed. The  patient was positioned over the fluoroscopy table. The area was prepped in the usual manner.  The time-out was completed. The target area was identified using fluoroscopy. A 12-in long, straight, sterile hemostat was used with fluoroscopic guidance to locate the targets for each level blocked. Once located, the skin was marked with an approved surgical skin marker. Once all sites were marked, the skin (epidermis, dermis, and hypodermis), as well as deeper tissues (fat, connective tissue and muscle) were infiltrated with a small amount of a short-acting local anesthetic, loaded on a 10cc syringe with a 25G, 1.5-in  Needle. An appropriate amount of time was allowed for local anesthetics to take effect before proceeding to the next step. Local Anesthetic: Lidocaine 2.0% The unused portion of the local anesthetic was discarded in the proper designated containers. Technical description of process:  L2 Medial Branch Nerve Block (MBB): The target area for the L2 medial branch is at the junction of the postero-lateral aspect of the superior articular process and the superior, posterior, and medial edge of the transverse process of L3. Under fluoroscopic guidance, a Quincke needle was inserted until contact was made with os over the superior postero-lateral aspect of the pedicular shadow (target area). After negative aspiration for blood, 0.5 mL of the nerve block solution was injected without difficulty or complication. The needle was removed intact. L3 Medial Branch Nerve Block (MBB): The target area for the L3 medial branch is at the junction of the postero-lateral aspect of the superior articular process and the superior, posterior, and medial edge of the transverse process of L4. Under fluoroscopic guidance, a Quincke needle was inserted until contact was made with os over the superior postero-lateral aspect of the pedicular shadow (target area). After negative aspiration for blood, 0.5 mL of the nerve block solution was injected without difficulty or complication. The needle was removed intact. L4 Medial  Branch Nerve Block (MBB): The target area for the L4 medial branch is at the junction of the postero-lateral aspect of the superior articular process and the superior, posterior, and medial edge of the transverse process of L5. Under fluoroscopic guidance, a Quincke needle was inserted until contact was made with os over the superior postero-lateral aspect of the pedicular shadow (target area). After negative aspiration for blood, 0.5 mL of the nerve block solution was injected without difficulty or complication. The needle was removed intact. L5 Medial Branch Nerve Block (MBB): The target area for the L5 medial branch is at the junction of the postero-lateral aspect of the superior articular process and the superior, posterior, and medial edge of the sacral ala. Under fluoroscopic guidance, a Quincke needle was inserted until contact was made with os over the superior postero-lateral aspect of the pedicular shadow (target area). After negative aspiration for blood, 0.5 mL of the nerve block solution was injected without difficulty or complication. The needle was removed intact. S1 Medial Branch Nerve Block (MBB): The target area for the S1 medial branch is at the posterior and inferior 6 o'clock position of the L5-S1 facet joint. Under fluoroscopic guidance, the Quincke needle inserted for the L5 MBB was redirected until contact was made with os over the inferior and postero aspect of the sacrum, at the 6 o' clock position under the L5-S1 facet joint (Target area). After negative aspiration for blood, 0.5 mL of the nerve block solution was injected without difficulty or complication. The needle was removed intact.  Once the entire procedure was completed, the treated area was cleaned, making sure  to leave some of the prepping solution back to take advantage of its long term bactericidal properties.         Illustration of the posterior view of the lumbar spine and the posterior neural structures. Laminae  of L2 through S1 are labeled. DPRL5, dorsal primary ramus of L5; DPRS1, dorsal primary ramus of S1; DPR3, dorsal primary ramus of L3; FJ, facet (zygapophyseal) joint L3-L4; I, inferior articular process of L4; LB1, lateral branch of dorsal primary ramus of L1; IAB, inferior articular branches from L3 medial branch (supplies L4-L5 facet joint); IBP, intermediate branch plexus; MB3, medial branch of dorsal primary ramus of L3; NR3, third lumbar nerve root; S, superior articular process of L5; SAB, superior articular branches from L4 (supplies L4-5 facet joint also); TP3, transverse process of L3.   Facet Joint Innervation (* possible contribution)  L1-2 T12, L1 (L2*)  Medial Branch  L2-3 L1, L2 (L3*)         "          "  L3-4 L2, L3 (L4*)         "          "  L4-5 L3, L4 (L5*)         "          "  L5-S1 L4, L5, S1          "          "    Vitals:   08/17/22 1115 08/17/22 1120 08/17/22 1125 08/17/22 1133  BP: (!) 144/82 135/88 131/86 118/85  Pulse: 90 93 88 88  Resp: 16 (!) 22 18 16   Temp:      TempSrc:      SpO2: 97% 98% 98% 96%  Weight:      Height:         End Time: 1125 hrs.  Imaging Guidance (Spinal):          Type of Imaging Technique: Fluoroscopy Guidance (Spinal) Indication(s): Assistance in needle guidance and placement for procedures requiring needle placement in or near specific anatomical locations not easily accessible without such assistance. Exposure Time: Please see nurses notes. Contrast: None used. Fluoroscopic Guidance: I was personally present during the use of fluoroscopy. "Tunnel Vision Technique" used to obtain the best possible view of the target area. Parallax error corrected before commencing the procedure. "Direction-depth-direction" technique used to introduce the needle under continuous pulsed fluoroscopy. Once target was reached, antero-posterior, oblique, and lateral fluoroscopic projection used confirm needle placement in all planes. Images permanently  stored in EMR. Interpretation: No contrast injected. I personally interpreted the imaging intraoperatively. Adequate needle placement confirmed in multiple planes. Permanent images saved into the patient's record.  Post-operative Assessment:  Post-procedure Vital Signs:  Pulse/HCG Rate: 88  Temp: 97.7 F (36.5 C) Resp: 16 BP: 118/85 SpO2: 96 %  EBL: None  Complications: No immediate post-treatment complications observed by team, or reported by patient.  Note: The patient tolerated the entire procedure well. A repeat set of vitals were taken after the procedure and the patient was kept under observation following institutional policy, for this type of procedure. Post-procedural neurological assessment was performed, showing return to baseline, prior to discharge. The patient was provided with post-procedure discharge instructions, including a section on how to identify potential problems. Should any problems arise concerning this procedure, the patient was given instructions to immediately contact us, at any time, without hesitation. In any case, we plan to contact the patient by telephone  for a follow-up status report regarding this interventional procedure.  Comments:  No additional relevant information.  Plan of Care (POC)  Orders:  Orders Placed This Encounter  Procedures   LUMBAR FACET(MEDIAL BRANCH NERVE BLOCK) MBNB    Scheduling Instructions:     Procedure: Lumbar facet block (AKA.: Lumbosacral medial branch nerve block)     Side: Right-sided     Level: TBD Facets (TBD Medial Branch Nerves)     Sedation: Patient's choice.     Timeframe: Today    Order Specific Question:   Where will this procedure be performed?    Answer:   [name]   DG PAIN [facility] C-ARM 1-60 MIN NO REPORT    Intraoperative interpretation by procedural physician at [place].    Standing Status:   Standing    Number of Occurrences:   1    Order Specific Question:   Reason for exam:     Answer:   Assistance in needle guidance and placement for procedures requiring needle placement in or near specific anatomical locations not easily accessible without such assistance.   MR LUMBAR SPINE WO CONTRAST    Patient presents with axial pain with possible radicular component. Please assist [place] in identifying specific level(s) and laterality of any additional findings such as: 1. Facet (Zygapophyseal) joint DJD (Hypertrophy, space narrowing, subchondral sclerosis, and/or osteophyte formation) 2. DDD and/or IVDD (Loss of disc height, desiccation, gas patterns, osteophytes, endplate sclerosis, or "Black disc disease") 3. Pars defects 4. Spondylolisthesis, spondylosis, and/or spondyloarthropathies (include Degree/Grade of displacement in mm) (stability) 5. Vertebral body Fractures (acute/chronic) (state percentage of collapse) 6. Demineralization (osteopenia/osteoporotic) 7. Bone pathology 8. Foraminal narrowing  9. Surgical changes 10. Central, Lateral Recess, and/or Foraminal Stenosis (include AP diameter of stenosis in mm) 11. Surgical changes (hardware type, status, and presence of fibrosis) 12. Modic Type Changes (MRI only) 13. IVDD (Disc bulge, protrusion, herniation, extrusion) (Level, laterality, extent)    Standing Status:   Future    Standing Expiration Date:   09/17/2022    Scheduling Instructions:     Please make sure that the patient understands that this needs to be done as soon as possible. Never have the patient do the imaging "just before the next appointment". Inform patient that having the imaging done within the [facility] will expedite the availability of the results and will provide      imaging availability to the requesting physician. In addition inform the patient that the imaging order has an expiration date and will not be renewed if not done within the active period.    Order Specific Question:   What is the patient's sedation requirement?    Answer:   No  Sedation    Order Specific Question:   Does the patient have a pacemaker or implanted devices?    Answer:   No    Order Specific Question:   Preferred imaging location?    Answer:   [name] (table limit-350lbs)    Order Specific Question:   Call Results- Best Contact Number?    Answer:   [phone] ([facility])    Order Specific Question:   Radiology Contrast Protocol - do NOT remove file path    Answer:   [email or web address]   Ambulatory referral to Neurosurgery    Referral Priority:   Routine    Referral Type:   Surgical    Referral Reason:   Specialty Services Required    Referred to Provider:   Izora Ribas,  Ardyth Gal, MD    Requested Specialty:   Neurosurgery    Number of Visits Requested:   1   Informed Consent Details: Physician/Practitioner Attestation; Transcribe to consent form and obtain patient signature    Nursing Order: Transcribe to consent form and obtain patient signature. Note: Always confirm laterality of pain with Ms. Brammer, before procedure.    Order Specific Question:   Physician/Practitioner attestation of informed consent for procedure/surgical case    Answer:   I, the physician/practitioner, attest that I have discussed with the patient the benefits, risks, side effects, alternatives, likelihood of achieving goals and potential problems during recovery for the procedure that I have provided informed consent.    Order Specific Question:   Procedure    Answer:   Lumbar Facet Block  under fluoroscopic guidance    Order Specific Question:   Physician/Practitioner performing the procedure    Answer:   Larin Weissberg A. Dossie Arbour MD    Order Specific Question:   Indication/Reason    Answer:   Low Back Pain, with our without leg pain, due to Facet Joint Arthralgia (Joint Pain) Spondylosis (Arthritis of the Spine), without myelopathy or radiculopathy (Nerve Damage).   Care order/instruction: Please confirm that the patient has  stopped the Coumadin (Warfarin) X 5 days prior to procedure or surgery.    Please confirm that the patient has stopped the Coumadin (Warfarin) X 5 days prior to procedure or surgery.    Standing Status:   Standing    Number of Occurrences:   1   Provide equipment / supplies at bedside    Procedure tray: "Block Tray" (Disposable  single use) Skin infiltration needle: Regular 1.5-in, 25-G, (x1) Block Needle type: Spinal Amount/quantity: 4 Size: Medium (5-inch) Gauge: 22G    Standing Status:   Standing    Number of Occurrences:   1    Order Specific Question:   Specify    Answer:   Block Tray   Bleeding precautions    Standing Status:   Standing    Number of Occurrences:   1   Latex precautions    Activate Latex-Free Protocol.    Standing Status:   Standing    Number of Occurrences:   1   Chronic Opioid Analgesic:  Oxycodone IR 10 mg, 1 tab PO qd,  from PCP. No chronic opioid analgesics therapy prescribed by our practice, 2ry to Medication Agreement Violation. (Getting oxycodone from PCP in multiple occasions, according to PMP)(11/21/20; 12/26/20; 01/16/21; 02/13/21). Opioid analgesic pharmacotherapy D/C'ed on 03/03/2021. MME: 20 mg/day.   Medications ordered for procedure: Meds ordered this encounter  Medications   lidocaine (XYLOCAINE) 2 % (with pres) injection 400 mg   pentafluoroprop-tetrafluoroeth (GEBAUERS) aerosol   lactated ringers infusion   midazolam (VERSED) 5 MG/5ML injection 0.5-2 mg    Make sure Flumazenil is available in the pyxis when using this medication. If oversedation occurs, administer 0.2 mg IV over 15 sec. If after 45 sec no response, administer 0.2 mg again over 1 min; may repeat at 1 min intervals; not to exceed 4 doses (1 mg)   fentaNYL (SUBLIMAZE) injection 25-50 mcg    Make sure Narcan is available in the pyxis when using this medication. In the event of respiratory depression (RR< 8/min): Titrate NARCAN (naloxone) in increments of 0.1 to 0.2 mg IV at 2-3  minute intervals, until desired degree of reversal.   ropivacaine (PF) 2 mg/mL (0.2%) (NAROPIN) injection 9 mL   triamcinolone acetonide (KENALOG-40) injection 40 mg   ketorolac (  TORADOL) injection 60 mg   methocarbamol (ROBAXIN) injection 200 mg   Medications administered: We administered lidocaine, pentafluoroprop-tetrafluoroeth, lactated ringers, midazolam, ropivacaine (PF) 2 mg/mL (0.2%), triamcinolone acetonide, ketorolac, and methocarbamol.  See the medical record for exact dosing, route, and time of administration.  Follow-up plan:   Return in about 2 weeks (around 08/31/2022) for Proc-day (T,Th), (Face2F), (PPE).       Interventional Therapies  Risk  Complexity Considerations:   ANTICOAGULATION: Coumadin (Stop: 5 days  Re-start: 2hrs)  Hx DVT & PE ALLERGY: Latex Morbid obesity (class III) ([id number])  metabolic syndrome  NIDDM HTN No Lumbar RFA due to morbid obesity    Planned  Pending:   Therapeutic right lumbar facet RFA #2 (if preapproval cannot be obtained quickly, then bring the patient in for a medial branch block)   Under consideration:      Completed:   Therapeutic bilateral lumbar facet MBB (L3-S1) x1 (04/06/2022) (80/80/90/90)  Diagnostic/therapeutic right IA hip inj. x2 (01/28/2022) (100/100/85/85)  Diagnostic/therapeutic left IA hip inj. x1 (01/28/2022) (100/100/85/85)  Diagnostic/therapeutic right trochanteric bursa inj. x1 (09/15/2021) (85/85/85)  Diagnostic/therapeutic left trochanteric bursa inj. x1 (01/28/2022) (100/100/85/85)  Therapeutic left lumbar facet RFA x1 (07/21/2021) (100/100/100 x1 week/80) (Lasted 9 months)  Therapeutic right lumbar facet RFA x1 (07/07/2021) (100/100/80/80) (Lasted 9 months)  Palliative/Therapeutic left L4-5 LESI x1 (11/10/2017) (100/80/0/0-25)  Diagnostic/therapeutic right L4 TFESI x1 (11/19/2021) (90/80/0/0) (fell at [employer]) Palliative left lumbar facet (L2-S1) MBB x18 (06/15/2022) (100/100/75/85)  Palliative right lumbar  facet (L2-S1) MBB x25 (08/17/2022) (10-5/10)  Palliative right SI joint block x9 (01/28/2022) (100/100/80/80)  Diagnostic left SI joint block x2 (01/28/2022) (100/100/80/80)  Palliative/Therapeutic (Midline) caudal ESI x3 (06/08/2018) (100/100/100 x 2-3 days/0)    Therapeutic  Palliative (PRN) options:   NO PRN procedures without F2F exam by Dr. Dossie Arbour due to multifactorial etiology of chronic pain. No more procedures until after 08/14/2022.    Pharmacotherapy  Nonopioids transferred 03/27/2020: Calcium        Recent Visits Date Type Provider Dept  08/05/22 Office Visit Milinda Pointer, MD [name]  07/01/22 Office Visit Milinda Pointer, MD [name]  06/15/22 Procedure visit Milinda Pointer, MD [name]  Showing recent visits within past 90 days and meeting all other requirements Today's Visits Date Type Provider Dept  08/17/22 Procedure visit Milinda Pointer, MD [name]  Showing today's visits and meeting all other requirements Future Appointments Date Type Provider Dept  09/02/22 Appointment Milinda Pointer, MD [name]  Showing future appointments within next 90 days and meeting all other requirements  Disposition: Discharge home  Discharge (Date  Time): 08/17/2022; 1200 hrs.   Primary Care Physician: Tracie Harrier, MD Location: [facility] Note by: Gaspar Cola, MD (TTS technology used. I apologize for any typographical errors that were not detected and corrected.) Date: 08/17/2022; Time: 12:02 PM  Disclaimer:  Medicine is not an Chief Strategy Officer. The only guarantee in medicine is that nothing is guaranteed. It is important to note that the decision to proceed with this intervention was based on the information collected from the patient. The Data and conclusions were drawn from the patient's questionnaire, the interview, and the physical examination. Because the  information was provided in large part by the patient, it cannot be guaranteed that it has not been purposely or unconsciously manipulated. Every effort has been made to obtain as much relevant data as possible for this evaluation. It is important to note that the conclusions  that lead to this procedure are derived in large part from the available data. Always take into account that the treatment will also be dependent on availability of resources and existing treatment guidelines, considered by other Pain Management Practitioners as being common knowledge and practice, at the time of the intervention. For Medico-Legal purposes, it is also important to point out that variation in procedural techniques and pharmacological choices are the acceptable norm. The indications, contraindications, technique, and results of the above procedure should only be interpreted and judged by a Board-Certified Interventional Pain Specialist with extensive familiarity and expertise in the same exact procedure and technique.

## 2022-08-17 NOTE — Progress Notes (Signed)
Safety precautions to be maintained throughout the outpatient stay will include: orient to surroundings, keep bed in low position, maintain call bell within reach at all times, provide assistance with transfer out of bed and ambulation.  

## 2022-08-17 NOTE — Progress Notes (Signed)
1140 C/o increased pain in the lower back, worse than when arrived. Dr. Dossie Arbour notified. Toradol and Robaxin given. 1200 States feeling some better. Ready for discharge.

## 2022-08-17 NOTE — Patient Instructions (Signed)

## 2022-08-18 ENCOUNTER — Telehealth: Payer: Self-pay | Admitting: *Deleted

## 2022-08-18 NOTE — Telephone Encounter (Signed)
No problems post procedure. 

## 2022-08-24 DIAGNOSIS — I10 Essential (primary) hypertension: Secondary | ICD-10-CM | POA: Diagnosis not present

## 2022-08-24 DIAGNOSIS — R791 Abnormal coagulation profile: Secondary | ICD-10-CM | POA: Diagnosis not present

## 2022-08-24 DIAGNOSIS — F411 Generalized anxiety disorder: Secondary | ICD-10-CM | POA: Diagnosis not present

## 2022-08-24 DIAGNOSIS — R195 Other fecal abnormalities: Secondary | ICD-10-CM | POA: Diagnosis not present

## 2022-08-24 DIAGNOSIS — F4321 Adjustment disorder with depressed mood: Secondary | ICD-10-CM | POA: Diagnosis not present

## 2022-08-24 DIAGNOSIS — K580 Irritable bowel syndrome with diarrhea: Secondary | ICD-10-CM | POA: Diagnosis not present

## 2022-08-24 DIAGNOSIS — F1120 Opioid dependence, uncomplicated: Secondary | ICD-10-CM | POA: Diagnosis not present

## 2022-09-01 NOTE — Progress Notes (Unsigned)
PROVIDER NOTE: Information contained herein reflects review and annotations entered in association with encounter. Interpretation of such information and data should be left to medically-trained personnel. Information provided to patient can be located elsewhere in the medical record under "Patient Instructions". Document created using STT-dictation technology, any transcriptional errors that may result from process are unintentional.    Patient: Kara [name]  Service Category: E/M  Provider: Oswaldo Done, MD  DOB: 11-23-1950  DOS: 09/02/2022  Referring Provider: Barbette Reichmann, MD  MRN: [id number]  Specialty: Interventional Pain Management  PCP: Barbette Reichmann, MD  Type: Established Patient  Setting: Ambulatory outpatient    Location: Office  Delivery: Face-to-face     HPI  Kara [name], a 72 y.o. year old female, is here today because of her No primary diagnosis found.. Kara [name] primary complain today is No chief complaint on file.  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); Lumbar pars defect (L3 and L4) (Bilateral); Diabetic peripheral neuropathy; Chronic lower extremity pain (2ry area of Pain) (Bilateral); Chronic radicular pain of lower extremity; Osteoarthritis of hip (Right); Chronic low back pain (1ry area of Pain) (Bilateral) w/o sciatica; Spondylosis without myelopathy or radiculopathy,  lumbosacral region; Other specified dorsopathies, sacral and sacrococcygeal region; Secondary osteoarthritis of multiple sites; Sacral insufficiency fracture; DDD (degenerative disc disease), lumbosacral; Coccygodynia; Chronic sacroiliac joint pain (Left); Osteoarthritis of lumbar spine; Spondylolisthesis, lumbosacral region; Abnormal MRI, lumbar spine (08/09/2019); Spasm of muscle of lower back; Chronic hip pain (Bilateral) (R>L); Osteoarthritis of hips (Bilateral); Greater trochanteric bursitis (Right); Chronic hip pain (Right); Chronic low back pain (Right) w/o sciatica; Greater trochanteric bursitis (Left); and Other bursitis of hip, not elsewhere classified (gluteus medius bursa) (Left) on their pertinent problem list. Pain Assessment: Severity of   is reported as a  /10. Location:    / . Onset:  . Quality:  . Timing:  . Modifying factor(s):  Marland Kitchen Vitals:  vitals were not taken for this visit.  BMI: Estimated body mass index is 35.61 kg/m as calculated from the following:   Height as of 08/17/22: 5\' 3"  (1.6 m).   Weight as of 08/17/22: 201 lb (91.2 kg). Last encounter: 08/05/2022. Last procedure: 08/17/2022.  Reason for encounter: post-procedure evaluation and assessment. ***  Post-procedure evaluation   Procedure: Lumbar Facet, Medial Branch Block(s) #2 of 2024 (total: #24 since 2019) Laterality: Right  Level: L2, L3, L4, L5, and S1 Medial Branch Level(s). Injecting these levels blocks the L3-4, L4-5, and L5-S1 lumbar facet joints. Imaging: Fluoroscopic guidance         Anesthesia: Local anesthesia (1-2% Lidocaine) Anxiolysis: IV Versed 2.0 mg Sedation: No Sedation                       DOS: 08/17/2022 Performed by: Oswaldo Done, MD  Primary Purpose: Diagnostic/Therapeutic Indications: Low back pain severe enough to impact quality of life or function. 1. Lumbar facet syndrome (Bilateral)   2. Lumbar facet arthropathy (L3-4, L4-5, and L5-S1) (Bilateral)   3. Spondylosis without  myelopathy or radiculopathy, lumbosacral  region   4. Grade 1-2 Anterolisthesis of L4/L5 & L5/S1 (stable as of 11/05/2021)   5. DDD (degenerative disc disease), lumbosacral   6. Chronic low back pain (1ry area of Pain) (Bilateral) w/o sciatica    Chronic anticoagulation (COUMADIN)    Morbid obesity with BMI of 40.0-44.9, adult (HCC)    NAS-11 Pain score:   Pre-procedure: 10-Worst pain ever/10   Post-procedure: 5 /10   Note: Today I had the patient's schedule an ECT since she had initially requested sedation for the procedure.  On 08/05/2022 she had indicated having primarily pain on the right side of the lower back and we had decided to bring her in for radiofrequency ablation of the right side.  However, she was questioning whether or not it would take long to get a renin so as an alternative I went ahead and put an order for a right-sided lumbar facet block.  At that time I asked her if she wanted me to do both sides and reminded the patient that whenever we schedule a block, we need to know that I had a time and that we no longer can be changing the plan or adding procedures on the actual procedure day without having gotten preapproval for such procedure.  She understood and she said that she only wanted to have the right side done.  Today upon arrival, she then indicated that she wanted to go ahead and have both sides on, but I had already warned her about this and I told her we could not since the schedule would not allow [place] to changes since today we have a fairly busy schedule following her.  We have had long conversations with regards to these procedures and the frequency that she is requesting them and how I would prefer not to be doing these as often.  She has been warned in several locations about the long-term side effects of the steroids.  Today she has decided that she is interested in talking to a neurosurgeon to see if there is anything that they can do.  She requested to see Dr. Marcell Barlow  home she had previously seen in the past for similar problems.  Because the patient's last lumbar MRI was done on 05/06/2021, today we have entered an order to have it repeated since it will be essential for the neurosurgical evaluation.  After today's procedure, again the patient requested an IM injection of Toradol and Robaxin.  It has been a while since she had requested them, but it is something that she had gotten into the habit of doing after procedures.  Review of the patient's last basic metabolic panel done on 11/22/2021 indicates her GFR to be within normal limits and therefore I have agreed to that IM injection.  During today's procedure, the patient seems to be more sensitive around the area of the L4-5 and L5-S1 facet joints suggesting that if we need to do a radiofrequency ablation, we may need to concentrate more on those 2 facet joints.     Effectiveness:  Initial hour after procedure:   ***. Subsequent 4-6 hours post-procedure:   ***. Analgesia past initial 6 hours:   ***. Ongoing improvement:  Analgesic:  *** Function:    ***    ROM:    ***     Pharmacotherapy Assessment  Analgesic: Oxycodone IR 10 mg, 1 tab PO qd,  from PCP. No chronic opioid analgesics therapy prescribed by our practice, 2ry to Medication Agreement Violation. (Getting oxycodone from PCP in  multiple occasions, according to PMP)(11/21/20; 12/26/20; 01/16/21; 02/13/21). Opioid analgesic pharmacotherapy D/C'ed on 03/03/2021. MME: 20 mg/day.   Monitoring: [place] PMP: PDMP reviewed during this encounter.       Pharmacotherapy: No side-effects or adverse reactions reported. Compliance: No problems identified. Effectiveness: Clinically acceptable.  No notes on file  No results found for: "CBDTHCR" No results found for: "D8THCCBX" No results found for: "D9THCCBX"  UDS:  Summary  Date Value Ref Range Status  11/12/2020 Note  Final    Comment:     ==================================================================== [employer] [facility] [employer] (MW) ==================================================================== Test                             Result       Flag       Units  Drug Present and Declared for Prescription Verification   Norhydrocodone                 61           EXPECTED   ng/mg creat    Norhydrocodone is an expected metabolite of hydrocodone.    Tramadol                       >4202        EXPECTED   ng/mg creat   O-Desmethyltramadol            >4202        EXPECTED   ng/mg creat   N-Desmethyltramadol            1712         EXPECTED   ng/mg creat    Source of tramadol is a prescription medication. O-desmethyltramadol    and N-desmethyltramadol are expected metabolites of tramadol.  Drug Absent but Declared for Prescription Verification   Hydrocodone                    Not Detected UNEXPECTED ng/mg creat    Hydrocodone is almost always present in patients taking this drug    consistently. Absence of hydrocodone could be due to lapse of time    since the last dose or unusual pharmacokinetics (rapid metabolism).  ==================================================================== Test                      Result    Flag   Units      Ref Range   Creatinine              119              mg/dL      >=19>=20 ==================================================================== Declared Medications:  The flagging and interpretation on this report are based on the  following declared medications.  Unexpected results may arise from  inaccuracies in the declared medications.   **Note: The testing scope of this panel includes these medications:   Hydrocodone  Tramadol   **Note: The testing scope of this panel does not include the  following reported medications:   Acetaminophen  Alendronate (Fosamax)  Aspirin  Calcium  Candesartan  Cholecalciferol  Cyanocobalamin  Cyclobenzaprine  Dulaglutide  Gabapentin  Glipizide   Meclizine  Multivitamin  Nystatin  Ondansetron  Ropinirole  Tolterodine  Warfarin ==================================================================== For clinical consultation, please call [phone]. ====================================================================       ROS  Constitutional: Denies any fever or chills Gastrointestinal: No reported hemesis, hematochezia, vomiting, or acute GI distress Musculoskeletal: Denies any acute  onset joint swelling, redness, loss of ROM, or weakness Neurological: No reported episodes of acute onset apraxia, aphasia, dysarthria, agnosia, amnesia, paralysis, loss of coordination, or loss of consciousness  Medication Review  Cholecalciferol, Dulaglutide, HYDROcodone-acetaminophen, Magnesium Oxide -Mg Supplement, Vitamin B-12, alendronate, calcium carbonate, candesartan, cyclobenzaprine, gabapentin, glipiZIDE, meclizine, oxybutynin, rOPINIRole, and warfarin  History Review  Allergy: Kara [name] is allergic to adhesive [tape], other, latex, and morphine and related. Drug: Kara [name]  reports no history of drug use. Alcohol:  reports no history of alcohol use. Tobacco:  reports that she has never smoked. She has never used smokeless tobacco. Social: Kara [name]  reports that she has never smoked. She has never used smokeless tobacco. She reports that she does not drink alcohol and does not use drugs. Medical:  has a past medical history of Anginal pain (HCC), Arthritis, CHF (congestive heart failure) (HCC), Diabetes (HCC), Diverticulitis (2018), History of being hospitalized, History of hiatal hernia, Hypertension, Osteoporosis, PE (pulmonary thromboembolism) (HCC), 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HCC), and Vertigo. Surgical: Kara [name]  has a past surgical history that includes Knee surgery; Carpal tunnel release (Bilateral); Colon resection sigmoid (N/A, 11/02/2016); Colostomy (N/A, 11/02/2016); Sigmoidoscopy (N/A, 11/13/2016); PULMONARY  VENOGRAPHY (N/A, 11/19/2016); Colonoscopy with propofol (N/A, 02/03/2017); Colon surgery (11/02/2016); IVC FILTER INSERTION (Right, 10/2016); Colostomy reversal (N/A, 02/24/2017); Appendectomy (N/A, 02/24/2017); Lysis of adhesion (N/A, 02/24/2017); laparoscopy (N/A, 02/24/2017); Ileo loop diversion (N/A, 02/24/2017); Sigmoidoscopy (N/A, 05/19/2017); Ileostomy closure (N/A, 06/01/2017); IVC FILTER REMOVAL (N/A, 08/09/2017); Sacroplasty (N/A, 11/08/2017); Cataract extraction w/PHACO (Left, 11/21/2018); Cataract extraction w/PHACO (Right, 12/12/2018); and Video bronchoscopy with endobronchial navigation (N/A, 02/23/2021). Family: family history includes Breast cancer in her mother; Cancer in her mother; Diabetes in her mother and sister; Heart disease in her father, mother, and sister.  Laboratory Chemistry Profile   Renal Lab Results  Component Value Date   BUN 10 11/22/2021   CREATININE 0.70 06/21/2022   GFRAA >60 07/09/2019   GFRNONAA >60 11/22/2021    Hepatic Lab Results  Component Value Date   AST 25 11/20/2021   ALT 31 11/20/2021   ALBUMIN 3.4 (L) 11/20/2021   ALKPHOS 38 11/20/2021   LIPASE 22 11/20/2021    Electrolytes Lab Results  Component Value Date   NA 142 11/22/2021   K 3.7 11/22/2021   CL 112 (H) 11/22/2021   CALCIUM 7.7 (L) 11/22/2021   MG 1.7 08/14/2020   PHOS 3.3 11/07/2016    Bone Lab Results  Component Value Date   25OHVITD1 25 (L) 11/02/2017   25OHVITD2 <1.0 11/02/2017   25OHVITD3 25 11/02/2017    Inflammation (CRP: Acute Phase) (ESR: Chronic Phase) Lab Results  Component Value Date   CRP 1.7 (H) 08/15/2020   ESRSEDRATE 12 11/02/2017   LATICACIDVEN 1.7 11/21/2021         Note: Above Lab results reviewed.  Recent Imaging Review  DG PAIN CLINIC C-ARM 1-60 MIN NO REPORT Fluoro was used, but no Radiologist interpretation will be provided.  Please refer to "NOTES" tab for provider progress note. Note: Reviewed        Physical Exam  General appearance: Well  nourished, well developed, and well hydrated. In no apparent acute distress Mental status: Alert, oriented x 3 (person, place, & time)       Respiratory: No evidence of acute respiratory distress Eyes: PERLA Vitals: There were no vitals taken for this visit. BMI: Estimated body mass index is 35.61 kg/m as calculated from the following:   Height as of 08/17/22: 5\' 3"  (1.6 m).  Weight as of 08/17/22: 201 lb (91.2 kg). Ideal: Patient weight not recorded  Assessment   Diagnosis Status  No diagnosis found. Controlled Controlled Controlled   Updated Problems: No problems updated.  Plan of Care  Problem-specific:  No problem-specific Assessment & Plan notes found for this encounter.  Kara [name] has a current medication list which includes the following long-term medication(s): calcium carbonate, candesartan, glipizide, ropinirole, and warfarin.  Pharmacotherapy (Medications Ordered): No orders of the defined types were placed in this encounter.  Orders:  No orders of the defined types were placed in this encounter.  Follow-up plan:   No follow-ups on file.      Interventional Therapies  Risk  Complexity Considerations:   ANTICOAGULATION: Coumadin (Stop: 5 days  Re-start: 2hrs)  Hx DVT & PE ALLERGY: Latex Morbid obesity (class III) (BMI>40)  metabolic syndrome  NIDDM HTN No Lumbar RFA due to morbid obesity    Planned  Pending:   Therapeutic right lumbar facet RFA #2 (if preapproval cannot be obtained quickly, then bring the patient in for a medial branch block)   Under consideration:      Completed:   Therapeutic bilateral lumbar facet MBB (L3-S1) x1 (04/06/2022) (80/80/90/90)  Diagnostic/therapeutic right IA hip inj. x2 (01/28/2022) (100/100/85/85)  Diagnostic/therapeutic left IA hip inj. x1 (01/28/2022) (100/100/85/85)  Diagnostic/therapeutic right trochanteric bursa inj. x1 (09/15/2021) (85/85/85)  Diagnostic/therapeutic left trochanteric bursa inj.  x1 (01/28/2022) (100/100/85/85)  Therapeutic left lumbar facet RFA x1 (07/21/2021) (100/100/100 x1 week/80) (Lasted 9 months)  Therapeutic right lumbar facet RFA x1 (07/07/2021) (100/100/80/80) (Lasted 9 months)  Palliative/Therapeutic left L4-5 LESI x1 (11/10/2017) (100/80/0/0-25)  Diagnostic/therapeutic right L4 TFESI x1 (11/19/2021) (90/80/0/0) (fell at [employer]) Palliative left lumbar facet (L2-S1) MBB x18 (06/15/2022) (100/100/75/85)  Palliative right lumbar facet (L2-S1) MBB x25 (08/17/2022) (10-5/10)  Palliative right SI joint block x9 (01/28/2022) (100/100/80/80)  Diagnostic left SI joint block x2 (01/28/2022) (100/100/80/80)  Palliative/Therapeutic (Midline) caudal ESI x3 (06/08/2018) (100/100/100 x 2-3 days/0)    Therapeutic  Palliative (PRN) options:   NO PRN procedures without F2F exam by Dr. Laban Emperor due to multifactorial etiology of chronic pain. No more procedures until after 08/14/2022.    Pharmacotherapy  Nonopioids transferred 03/27/2020: Calcium         Recent Visits Date Type Provider Dept  08/17/22 Procedure visit Delano Metz, MD [name]  08/05/22 Office Visit Delano Metz, MD [name]  07/01/22 Office Visit Delano Metz, MD [name]  06/15/22 Procedure visit Delano Metz, MD [name]  Showing recent visits within past 90 days and meeting all other requirements Future Appointments Date Type Provider Dept  09/02/22 Appointment Delano Metz, MD [name]  Showing future appointments within next 90 days and meeting all other requirements  I discussed the assessment and treatment plan with the patient. The patient was provided an opportunity to ask questions and all were answered. The patient agreed with the plan and demonstrated an understanding of the instructions.  Patient advised to call back or seek an in-person evaluation if the symptoms or condition  worsens.  Duration of encounter: *** minutes.  Total time on encounter, as per AMA guidelines included both the face-to-face and non-face-to-face time personally spent by the physician and/or other qualified health care professional(s) on the day of the encounter (includes time in activities that require the physician or other qualified health care professional and does not include time in activities normally performed by clinical staff). Physician's time may include  the following activities when performed: Preparing to see the patient (e.g., pre-charting review of records, searching for previously ordered imaging, lab work, and nerve conduction tests) Review of prior analgesic pharmacotherapies. Reviewing PMP Interpreting ordered tests (e.g., lab work, imaging, nerve conduction tests) Performing post-procedure evaluations, including interpretation of diagnostic procedures Obtaining and/or reviewing separately obtained history Performing a medically appropriate examination and/or evaluation Counseling and educating the patient/family/caregiver Ordering medications, tests, or procedures Referring and communicating with other health care professionals (when not separately reported) Documenting clinical information in the electronic or other health record Independently interpreting results (not separately reported) and communicating results to the patient/ family/caregiver Care coordination (not separately reported)  Note by: Oswaldo Done, MD Date: 09/02/2022; Time: 6:44 AM

## 2022-09-02 ENCOUNTER — Encounter: Payer: Self-pay | Admitting: Pain Medicine

## 2022-09-02 ENCOUNTER — Ambulatory Visit: Payer: PPO | Attending: Pain Medicine | Admitting: Pain Medicine

## 2022-09-02 VITALS — BP 117/68 | HR 80 | Temp 97.3°F | Resp 18 | Ht 63.0 in | Wt 199.0 lb

## 2022-09-02 DIAGNOSIS — M5459 Other low back pain: Secondary | ICD-10-CM | POA: Insufficient documentation

## 2022-09-02 DIAGNOSIS — M4310 Spondylolisthesis, site unspecified: Secondary | ICD-10-CM | POA: Insufficient documentation

## 2022-09-02 DIAGNOSIS — M25551 Pain in right hip: Secondary | ICD-10-CM | POA: Diagnosis not present

## 2022-09-02 DIAGNOSIS — M25552 Pain in left hip: Secondary | ICD-10-CM | POA: Insufficient documentation

## 2022-09-02 DIAGNOSIS — M47817 Spondylosis without myelopathy or radiculopathy, lumbosacral region: Secondary | ICD-10-CM | POA: Insufficient documentation

## 2022-09-02 DIAGNOSIS — M545 Low back pain, unspecified: Secondary | ICD-10-CM | POA: Diagnosis not present

## 2022-09-02 DIAGNOSIS — G8929 Other chronic pain: Secondary | ICD-10-CM | POA: Diagnosis not present

## 2022-09-02 DIAGNOSIS — Z7901 Long term (current) use of anticoagulants: Secondary | ICD-10-CM | POA: Diagnosis not present

## 2022-09-02 DIAGNOSIS — M4317 Spondylolisthesis, lumbosacral region: Secondary | ICD-10-CM | POA: Diagnosis not present

## 2022-09-02 DIAGNOSIS — M47816 Spondylosis without myelopathy or radiculopathy, lumbar region: Secondary | ICD-10-CM | POA: Diagnosis not present

## 2022-09-02 NOTE — Progress Notes (Signed)
Safety precautions to be maintained throughout the outpatient stay will include: orient to surroundings, keep bed in low position, maintain call bell within reach at all times, provide assistance with transfer out of bed and ambulation.  

## 2022-09-10 DIAGNOSIS — R791 Abnormal coagulation profile: Secondary | ICD-10-CM | POA: Diagnosis not present

## 2022-09-13 DIAGNOSIS — I700 Atherosclerosis of aorta: Secondary | ICD-10-CM | POA: Diagnosis not present

## 2022-09-13 DIAGNOSIS — E1165 Type 2 diabetes mellitus with hyperglycemia: Secondary | ICD-10-CM | POA: Diagnosis not present

## 2022-09-13 DIAGNOSIS — I10 Essential (primary) hypertension: Secondary | ICD-10-CM | POA: Diagnosis not present

## 2022-09-13 DIAGNOSIS — M5136 Other intervertebral disc degeneration, lumbar region: Secondary | ICD-10-CM | POA: Diagnosis not present

## 2022-09-13 DIAGNOSIS — C3411 Malignant neoplasm of upper lobe, right bronchus or lung: Secondary | ICD-10-CM | POA: Diagnosis not present

## 2022-09-28 DIAGNOSIS — Z86711 Personal history of pulmonary embolism: Secondary | ICD-10-CM | POA: Diagnosis not present

## 2022-09-28 DIAGNOSIS — R791 Abnormal coagulation profile: Secondary | ICD-10-CM | POA: Diagnosis not present

## 2022-09-28 NOTE — Progress Notes (Unsigned)
PROVIDER NOTE: Information contained herein reflects review and annotations entered in association with encounter. Interpretation of such information and data should be left to medically-trained personnel. Information provided to patient can be located elsewhere in the medical record under "Patient Instructions". Document created using STT-dictation technology, any transcriptional errors that may result from process are unintentional.    Patient: Kara [name]  Service Category: E/M  Provider: Oswaldo Done, MD  DOB: Jul 24, 1950  DOS: 09/29/2022  Referring Provider: Barbette Reichmann, MD  MRN: [id number]  Specialty: Interventional Pain Management  PCP: Barbette Reichmann, MD  Type: Established Patient  Setting: Ambulatory outpatient    Location: Office  Delivery: Face-to-face     HPI  Kara [name], a 72 y.o. year old female, is here today because of her No primary diagnosis found.. Kara [name] primary complain today is No chief complaint on file.  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); Lumbar pars defect (L3 and L4) (Bilateral); Diabetic peripheral neuropathy (HCC); Chronic lower extremity pain (2ry area of Pain) (Bilateral); Chronic radicular pain of lower extremity; Osteoarthritis of hip (Right); Chronic low back pain (1ry area of Pain) (Bilateral) w/o sciatica; Spondylosis without myelopathy or radiculopathy,  lumbosacral region; Other specified dorsopathies, sacral and sacrococcygeal region; Secondary osteoarthritis of multiple sites; Sacral insufficiency fracture; DDD (degenerative disc disease), lumbosacral; Coccygodynia; Chronic sacroiliac joint pain (Left); Osteoarthritis of lumbar spine; Spondylolisthesis, lumbosacral region; Abnormal MRI, lumbar spine (08/09/2019); Spasm of muscle of lower back; Chronic hip pain (Bilateral) (R>L); Osteoarthritis of hips (Bilateral); Greater trochanteric bursitis (Right); Chronic hip pain (Right); Chronic low back pain (Right) w/o sciatica; Greater trochanteric bursitis (Left); Other bursitis of hip, not elsewhere classified (gluteus medius bursa) (Left); and Lumbar facet joint pain on their pertinent problem list. Pain Assessment: Severity of   is reported as a  /10. Location:    / . Onset:  . Quality:  . Timing:  . Modifying factor(s):  Marland Kitchen Vitals:  vitals were not taken for this visit.  BMI: Estimated body mass index is 35.25 kg/m as calculated from the following:   Height as of 09/02/22: 5\' 3"  (1.6 m).   Weight as of 09/02/22: 199 lb (90.3 kg). Last encounter: 09/02/2022. Last procedure: 08/17/2022.  Palliative right lumbar facet block (MBB) (L2-S1)  Reason for encounter: evaluation of worsening, or previously known (established) problem. ***  Pharmacotherapy Assessment  Analgesic: Oxycodone IR 10 mg, 1 tab PO qd,  from PCP. No chronic opioid analgesics therapy prescribed by our practice, 2ry to Medication Agreement Violation. (Getting oxycodone from PCP in multiple occasions, according to PMP)(11/21/20; 12/26/20; 01/16/21; 02/13/21). Opioid analgesic pharmacotherapy D/C'ed on 03/03/2021. MME: 20 mg/day.   Monitoring: [place] PMP: PDMP reviewed during this encounter.       Pharmacotherapy: No side-effects or adverse reactions reported. Compliance: No problems identified. Effectiveness: Clinically acceptable.  No notes on file  No results found for: "CBDTHCR" No results  found for: "D8THCCBX" No results found for: "D9THCCBX"  UDS:  Summary  Date Value Ref Range Status  11/12/2020 Note  Final    Comment:    ====================================================================  [employer] Select 13 (MW) ==================================================================== Test                             Result       Flag       Units  Drug Present and Declared for Prescription Verification   Norhydrocodone                 61           EXPECTED   ng/mg creat    Norhydrocodone is an expected metabolite of hydrocodone.    Tramadol                       >4202        EXPECTED   ng/mg creat   O-Desmethyltramadol            >4202        EXPECTED   ng/mg creat   N-Desmethyltramadol            1712         EXPECTED   ng/mg creat    Source of tramadol is a prescription medication. O-desmethyltramadol    and N-desmethyltramadol are expected metabolites of tramadol.  Drug Absent but Declared for Prescription Verification   Hydrocodone                    Not Detected UNEXPECTED ng/mg creat    Hydrocodone is almost always present in patients taking this drug    consistently. Absence of hydrocodone could be due to lapse of time    since the last dose or unusual pharmacokinetics (rapid metabolism).  ==================================================================== Test                      Result    Flag   Units      Ref Range   Creatinine              119              mg/dL      >=27 ==================================================================== Declared Medications:  The flagging and interpretation on this report are based on the  following declared medications.  Unexpected results may arise from  inaccuracies in the declared medications.   **Note: The testing scope of this panel includes these medications:   Hydrocodone  Tramadol   **Note: The testing scope of this panel does not include the  following reported medications:   Acetaminophen   Alendronate (Fosamax)  Aspirin  Calcium  Candesartan  Cholecalciferol  Cyanocobalamin  Cyclobenzaprine  Dulaglutide  Gabapentin  Glipizide  Meclizine  Multivitamin  Nystatin  Ondansetron  Ropinirole  Tolterodine  Warfarin ==================================================================== For clinical consultation, please call [phone]. ====================================================================       ROS  Constitutional: Denies any fever or chills Gastrointestinal: No reported hemesis, hematochezia, vomiting, or acute GI distress Musculoskeletal: Denies any acute onset joint swelling, redness, loss of ROM, or weakness Neurological: No reported episodes of acute onset apraxia, aphasia, dysarthria, agnosia, amnesia, paralysis, loss of coordination, or loss of consciousness  Medication Review  Dulaglutide, HYDROcodone-acetaminophen, Magnesium Oxide -Mg Supplement, Vitamin B-12, alendronate, calcium carbonate, candesartan, cholecalciferol, cyclobenzaprine, gabapentin, glipiZIDE, meclizine, oxybutynin, rOPINIRole, and warfarin  History Review  Allergy: Kara [name] is allergic to adhesive [tape], other, latex, and morphine and related. Drug: Kara [name]  reports no history of drug use. Alcohol:  reports no history of alcohol use. Tobacco:  reports that she has never smoked. She has never used smokeless tobacco. Social: Kara [name]  reports that she has never smoked. She has never used smokeless tobacco. She reports that she does not drink alcohol and does not use drugs. Medical:  has a past medical history of Anginal pain (HCC), Arthritis, CHF (congestive heart failure) (HCC), Diabetes (HCC), Diverticulitis (2018), History of being hospitalized, History of hiatal hernia, Hypertension, Osteoporosis, PE (pulmonary thromboembolism) (HCC), 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HCC), and Vertigo. Surgical: Kara [name]  has a past surgical history that includes Knee  surgery; Carpal tunnel release (Bilateral); Colon resection sigmoid (N/A, 11/02/2016); Colostomy (N/A, 11/02/2016); Sigmoidoscopy (N/A, 11/13/2016); PULMONARY VENOGRAPHY (N/A, 11/19/2016); Colonoscopy with propofol (N/A, 02/03/2017); Colon surgery (11/02/2016); IVC FILTER INSERTION (Right, 10/2016); Colostomy reversal (N/A, 02/24/2017); Appendectomy (N/A, 02/24/2017); Lysis of adhesion (N/A, 02/24/2017); laparoscopy (N/A, 02/24/2017); Ileo loop diversion (N/A, 02/24/2017); Sigmoidoscopy (N/A, 05/19/2017); Ileostomy closure (N/A, 06/01/2017); IVC FILTER REMOVAL (N/A, 08/09/2017); Sacroplasty (N/A, 11/08/2017); Cataract extraction w/PHACO (Left, 11/21/2018); Cataract extraction w/PHACO (Right, 12/12/2018); and Video bronchoscopy with endobronchial navigation (N/A, 02/23/2021). Family: family history includes Breast cancer in her mother; Cancer in her mother; Diabetes in her mother and sister; Heart disease in her father, mother, and sister.  Laboratory Chemistry Profile   Renal Lab Results  Component Value Date   BUN 10 11/22/2021   CREATININE 0.70 06/21/2022   GFRAA >60 07/09/2019   GFRNONAA >60 11/22/2021    Hepatic Lab Results  Component Value Date   AST 25 11/20/2021   ALT 31 11/20/2021   ALBUMIN 3.4 (L) 11/20/2021   ALKPHOS 38 11/20/2021   LIPASE 22 11/20/2021    Electrolytes Lab Results  Component Value Date   NA 142 11/22/2021   K 3.7 11/22/2021   CL 112 (H) 11/22/2021   CALCIUM 7.7 (L) 11/22/2021   MG 1.7 08/14/2020   PHOS 3.3 11/07/2016    Bone Lab Results  Component Value Date   25OHVITD1 25 (L) 11/02/2017   25OHVITD2 <1.0 11/02/2017   25OHVITD3 25 11/02/2017    Inflammation (CRP: Acute Phase) (ESR: Chronic Phase) Lab Results  Component Value Date   CRP 1.7 (H) 08/15/2020   ESRSEDRATE 12 11/02/2017   LATICACIDVEN 1.7 11/21/2021         Note: Above Lab results reviewed.  Recent Imaging Review  DG PAIN CLINIC C-ARM 1-60 MIN NO REPORT Fluoro was used, but no Radiologist  interpretation will be provided.  Please refer to "NOTES" tab for provider progress note. Note: Reviewed        Physical Exam  General appearance: Well nourished, well developed, and well hydrated. In no apparent acute distress Mental status: Alert, oriented x 3 (person, place, & time)       Respiratory: No evidence of acute respiratory distress Eyes: PERLA Vitals: There were no vitals taken for this visit. BMI: Estimated body mass index is 35.25 kg/m as calculated from the following:   Height as of 09/02/22: 5\' 3"  (1.6 m).   Weight as of 09/02/22: 199 lb (90.3 kg). Ideal: Patient weight not recorded  Assessment   Diagnosis Status  No diagnosis found. Controlled Controlled Controlled   Updated Problems: No problems updated.  Plan of Care  Problem-specific:  No problem-specific Assessment & Plan notes found for this encounter.  Kara [name] has a current medication list which includes the following long-term medication(s): calcium carbonate, candesartan, glipizide, ropinirole, and warfarin.  Pharmacotherapy (Medications Ordered): No orders of the defined types were placed in this encounter.  Orders:  No orders of the defined types were placed in this encounter.  Follow-up plan:   No follow-ups on file.      Interventional Therapies  Risk  Complexity Considerations:   ANTICOAGULATION: Coumadin (Stop: 5 days  Re-start: 2hrs)  Hx DVT & PE ALLERGY: Latex Morbid obesity (class III) (BMI>40)  metabolic syndrome  NIDDM  HTN Difficult Lumbar RFA due to morbid obesity    Planned  Pending:      Under consideration:   Therapeutic bilateral lumbar facet RFA #2    Completed:   Therapeutic bilateral lumbar facet MBB (L3-S1) x1 (04/06/2022) (80/80/90/90)  Diagnostic/therapeutic right IA hip inj. x2 (01/28/2022) (100/100/85/85)  Diagnostic/therapeutic left IA hip inj. x1 (01/28/2022) (100/100/85/85)  Diagnostic/therapeutic right trochanteric bursa inj. x1  (09/15/2021) (85/85/85)  Diagnostic/therapeutic left trochanteric bursa inj. x1 (01/28/2022) (100/100/85/85)  Therapeutic left lumbar facet RFA x1 (07/21/2021) (100/100/100 x1 week/80) (Lasted 9 months)  Therapeutic right lumbar facet RFA x1 (07/07/2021) (100/100/80/80) (Lasted 9 months)  Palliative/Therapeutic left L4-5 LESI x1 (11/10/2017) (100/80/0/0-25)  Diagnostic/therapeutic right L4 TFESI x1 (11/19/2021) (90/80/0/0) (fell at [employer]) Palliative left lumbar facet (L2-S1) MBB x18 (06/15/2022) (100/100/75/85)  Palliative right lumbar facet (L2-S1) MBB x25 (08/17/2022) (10-5/10)  Palliative right SI joint block x9 (01/28/2022) (100/100/80/80)  Diagnostic left SI joint block x2 (01/28/2022) (100/100/80/80)  Palliative/Therapeutic (Midline) caudal ESI x3 (06/08/2018) (100/100/100 x 2-3 days/0)    Therapeutic  Palliative (PRN) options:   NO PRN procedures without F2F exam by Dr. Laban Emperor due to multifactorial etiology of chronic pain. No more procedures until after 08/14/2022.    Pharmacotherapy  Nonopioids transferred 03/27/2020: Calcium        Recent Visits Date Type Provider Dept  09/02/22 Office Visit Delano Metz, MD [name]  08/17/22 Procedure visit Delano Metz, MD [name]  08/05/22 Office Visit Delano Metz, MD [name]  07/01/22 Office Visit Delano Metz, MD [name]  Showing recent visits within past 90 days and meeting all other requirements Future Appointments Date Type Provider Dept  09/29/22 Appointment Delano Metz, MD [name]  Showing future appointments within next 90 days and meeting all other requirements  I discussed the assessment and treatment plan with the patient. The patient was provided an opportunity to ask questions and all were answered. The patient agreed with the plan and demonstrated an understanding of the instructions.  Patient advised to call back or  seek an in-person evaluation if the symptoms or condition worsens.  Duration of encounter: *** minutes.  Total time on encounter, as per AMA guidelines included both the face-to-face and non-face-to-face time personally spent by the physician and/or other qualified health care professional(s) on the day of the encounter (includes time in activities that require the physician or other qualified health care professional and does not include time in activities normally performed by clinical staff). Physician's time may include the following activities when performed: Preparing to see the patient (e.g., pre-charting review of records, searching for previously ordered imaging, lab work, and nerve conduction tests) Review of prior analgesic pharmacotherapies. Reviewing PMP Interpreting ordered tests (e.g., lab work, imaging, nerve conduction tests) Performing post-procedure evaluations, including interpretation of diagnostic procedures Obtaining and/or reviewing separately obtained history Performing a medically appropriate examination and/or evaluation Counseling and educating the patient/family/caregiver Ordering medications, tests, or procedures Referring and communicating with other health care professionals (when not separately reported) Documenting clinical information in the electronic or other health record Independently interpreting results (not separately reported) and communicating results to  the patient/ family/caregiver Care coordination (not separately reported)  Note by: Oswaldo Done, MD Date: 09/29/2022; Time: 7:20 AM

## 2022-09-29 ENCOUNTER — Encounter: Payer: Self-pay | Admitting: Pain Medicine

## 2022-09-29 ENCOUNTER — Ambulatory Visit: Payer: PPO | Attending: Pain Medicine | Admitting: Pain Medicine

## 2022-09-29 VITALS — BP 125/66 | HR 79 | Temp 98.6°F | Resp 18 | Ht 63.0 in | Wt 201.0 lb

## 2022-09-29 DIAGNOSIS — M5137 Other intervertebral disc degeneration, lumbosacral region: Secondary | ICD-10-CM | POA: Insufficient documentation

## 2022-09-29 DIAGNOSIS — M4316 Spondylolisthesis, lumbar region: Secondary | ICD-10-CM | POA: Diagnosis not present

## 2022-09-29 DIAGNOSIS — M545 Low back pain, unspecified: Secondary | ICD-10-CM | POA: Diagnosis not present

## 2022-09-29 DIAGNOSIS — M47817 Spondylosis without myelopathy or radiculopathy, lumbosacral region: Secondary | ICD-10-CM | POA: Diagnosis not present

## 2022-09-29 DIAGNOSIS — G8929 Other chronic pain: Secondary | ICD-10-CM | POA: Insufficient documentation

## 2022-09-29 DIAGNOSIS — F4321 Adjustment disorder with depressed mood: Secondary | ICD-10-CM | POA: Diagnosis not present

## 2022-09-29 DIAGNOSIS — M4310 Spondylolisthesis, site unspecified: Secondary | ICD-10-CM | POA: Insufficient documentation

## 2022-09-29 DIAGNOSIS — M47816 Spondylosis without myelopathy or radiculopathy, lumbar region: Secondary | ICD-10-CM | POA: Diagnosis not present

## 2022-09-29 DIAGNOSIS — Z7901 Long term (current) use of anticoagulants: Secondary | ICD-10-CM | POA: Diagnosis not present

## 2022-09-29 DIAGNOSIS — M5459 Other low back pain: Secondary | ICD-10-CM | POA: Insufficient documentation

## 2022-09-29 DIAGNOSIS — R4589 Other symptoms and signs involving emotional state: Secondary | ICD-10-CM | POA: Insufficient documentation

## 2022-09-29 DIAGNOSIS — M4306 Spondylolysis, lumbar region: Secondary | ICD-10-CM | POA: Insufficient documentation

## 2022-09-29 NOTE — Patient Instructions (Signed)
______________________________________________________________________  Procedure instructions  Do not eat or drink fluids (other than water) for 6 hours before your procedure  No water for 2 hours before your procedure  Take your blood pressure medicine with a sip of water  Arrive 30 minutes before your appointment  Carefully read the "Preparing for your procedure" detailed instructions  If you have questions call us at [phone]  _____________________________________________________________________    ______________________________________________________________________  Preparing for your procedure  Appointments: If you think you may not be able to keep your appointment, call 24-48 hours in advance to cancel. We need time to make it available to others.  During your procedure appointment there will be: No Prescription Refills. No disability issues to discussed. No medication changes or discussions.  Instructions: Food intake: Avoid eating anything solid for at least 8 hours prior to your procedure. Clear liquid intake: You may take clear liquids such as water up to 2 hours prior to your procedure. (No carbonated drinks. No soda.) Transportation: Unless otherwise stated by your physician, bring a driver. Morning Medicines: Except for blood thinners, take all of your other morning medications with a sip of water. Make sure to take your heart and blood pressure medicines. If your blood pressure's lower number is above 100, the case will be rescheduled. Blood thinners: Make sure to stop your blood thinners as instructed.  If you take a blood thinner, but were not instructed to stop it, call our office [phone] and ask to talk to a nurse. Not stopping a blood thinner prior to certain procedures could lead to serious complications. Diabetics on insulin: Notify the staff so that you can be scheduled 1st case in the morning. If your diabetes requires high dose insulin,  take only  of your normal insulin dose the morning of the procedure and notify the staff that you have done so. Preventing infections: Shower with an antibacterial soap the morning of your procedure.  Build-up your immune system: Take 1000 mg of Vitamin C with every meal (3 times a day) the day prior to your procedure. Antibiotics: Inform the nursing staff if you are taking any antibiotics or if you have any conditions that may require antibiotics prior to procedures. (Example: recent joint implants)   Pregnancy: If you are pregnant make sure to notify the nursing staff. Not doing so may result in injury to the fetus, including death.  Sickness: If you have a cold, fever, or any active infections, call and cancel or reschedule your procedure. Receiving steroids while having an infection may result in complications. Arrival: You must be in the facility at least 30 minutes prior to your scheduled procedure. Tardiness: Your scheduled time is also the cutoff time. If you do not arrive at least 15 minutes prior to your procedure, you will be rescheduled.  Children: Do not bring any children with you. Make arrangements to keep them home. Dress appropriately: There is always a possibility that your clothing may get soiled. Avoid long dresses. Valuables: Do not bring any jewelry or valuables.  Reasons to call and reschedule or cancel your procedure: (Following these recommendations will minimize the risk of a serious complication.) Surgeries: Avoid having procedures within 2 weeks of any surgery. (Avoid for 2 weeks before or after any surgery). Flu Shots: Avoid having procedures within 2 weeks of a flu shots or . (Avoid for 2 weeks before or after immunizations). Barium: Avoid having a procedure within 7-10 days after having had a radiological study involving the use of   radiological contrast. (Myelograms, Barium swallow or enema study). Heart attacks: Avoid any elective procedures or surgeries for the  initial 6 months after a "Myocardial Infarction" (Heart Attack). Blood thinners: It is imperative that you stop these medications before procedures. Let us know if you if you take any blood thinner.  Infection: Avoid procedures during or within two weeks of an infection (including chest colds or gastrointestinal problems). Symptoms associated with infections include: Localized redness, fever, chills, night sweats or profuse sweating, burning sensation when voiding, cough, congestion, stuffiness, runny nose, sore throat, diarrhea, nausea, vomiting, cold or Flu symptoms, recent or current infections. It is specially important if the infection is over the area that we intend to treat. Heart and lung problems: Symptoms that may suggest an active cardiopulmonary problem include: cough, chest pain, breathing difficulties or shortness of breath, dizziness, ankle swelling, uncontrolled high or unusually low blood pressure, and/or palpitations. If you are experiencing any of these symptoms, cancel your procedure and contact your primary care physician for an evaluation.  Remember:  Regular Business hours are:  Monday to Thursday 8:00 AM to 4:00 PM  Provider's Schedule: Johm Pfannenstiel, MD:  Procedure days: Tuesday and Thursday 7:30 AM to 4:00 PM  Bilal Lateef, MD:  Procedure days: Monday and Wednesday 7:30 AM to 4:00 PM  ______________________________________________________________________    ____________________________________________________________________________________________  General Risks and Possible Complications  Patient Responsibilities: It is important that you read this as it is part of your informed consent. It is our duty to inform you of the risks and possible complications associated with treatments offered to you. It is your responsibility as a patient to read this and to ask questions about anything that is not clear or that you believe was not covered in this  document.  Patient's Rights: You have the right to refuse treatment. You also have the right to change your mind, even after initially having agreed to have the treatment done. However, under this last option, if you wait until the last second to change your mind, you may be charged for the materials used up to that point.  Introduction: Medicine is not an exact science. Everything in Medicine, including the lack of treatment(s), carries the potential for danger, harm, or loss (which is by definition: Risk). In Medicine, a complication is a secondary problem, condition, or disease that can aggravate an already existing one. All treatments carry the risk of possible complications. The fact that a side effects or complications occurs, does not imply that the treatment was conducted incorrectly. It must be clearly understood that these can happen even when everything is done following the highest safety standards.  No treatment: You can choose not to proceed with the proposed treatment alternative. The "PRO(s)" would include: avoiding the risk of complications associated with the therapy. The "CON(s)" would include: not getting any of the treatment benefits. These benefits fall under one of three categories: diagnostic; therapeutic; and/or palliative. Diagnostic benefits include: getting information which can ultimately lead to improvement of the disease or symptom(s). Therapeutic benefits are those associated with the successful treatment of the disease. Finally, palliative benefits are those related to the decrease of the primary symptoms, without necessarily curing the condition (example: decreasing the pain from a flare-up of a chronic condition, such as incurable terminal cancer).  General Risks and Complications: These are associated to most interventional treatments. They can occur alone, or in combination. They fall under one of the following six (6) categories: no benefit or worsening of symptoms;    bleeding; infection; nerve damage; allergic reactions; and/or death. No benefits or worsening of symptoms: In Medicine there are no guarantees, only probabilities. No healthcare provider can ever guarantee that a medical treatment will work, they can only state the probability that it may. Furthermore, there is always the possibility that the condition may worsen, either directly, or indirectly, as a consequence of the treatment. Bleeding: This is more common if the patient is taking a blood thinner, either prescription or over the counter (example: Goody Powders, Fish oil, Aspirin, Garlic, etc.), or if suffering a condition associated with impaired coagulation (example: Hemophilia, cirrhosis of the liver, low platelet counts, etc.). However, even if you do not have one on these, it can still happen. If you have any of these conditions, or take one of these drugs, make sure to notify your treating physician. Infection: This is more common in patients with a compromised immune system, either due to disease (example: diabetes, cancer, human immunodeficiency virus [HIV], etc.), or due to medications or treatments (example: therapies used to treat cancer and rheumatological diseases). However, even if you do not have one on these, it can still happen. If you have any of these conditions, or take one of these drugs, make sure to notify your treating physician. Nerve Damage: This is more common when the treatment is an invasive one, but it can also happen with the use of medications, such as those used in the treatment of cancer. The damage can occur to small secondary nerves, or to large primary ones, such as those in the spinal cord and brain. This damage may be temporary or permanent and it may lead to impairments that can range from temporary numbness to permanent paralysis and/or brain death. Allergic Reactions: Any time a substance or material comes in contact with our body, there is the possibility of an  allergic reaction. These can range from a mild skin rash (contact dermatitis) to a severe systemic reaction (anaphylactic reaction), which can result in death. Death: In general, any medical intervention can result in death, most of the time due to an unforeseen complication. ____________________________________________________________________________________________    ____________________________________________________________________________________________  Blood Thinners  IMPORTANT NOTICE:  If you take any of these, make sure to notify the nursing staff.  Failure to do so may result in injury.  Recommended time intervals to stop and restart blood-thinners, before & after invasive procedures  Generic Name Brand Name Pre-procedure. Stop this long before procedure. Post-procedure. Minimum waiting period before restarting.  Abciximab Reopro 15 days 2 hrs  Alteplase Activase 10 days 10 days  Anagrelide Agrylin    Apixaban Eliquis 3 days 6 hrs  Cilostazol Pletal 3 days 5 hrs  Clopidogrel Plavix 7-10 days 2 hrs  Dabigatran Pradaxa 5 days 6 hrs  Dalteparin Fragmin 24 hours 4 hrs  Dipyridamole Aggrenox 11days 2 hrs  Edoxaban Lixiana; Savaysa 3 days 2 hrs  Enoxaparin  Lovenox 24 hours 4 hrs  Eptifibatide Integrillin 8 hours 2 hrs  Fondaparinux  Arixtra 72 hours 12 hrs  Hydroxychloroquine Plaquenil 11 days   Prasugrel Effient 7-10 days 6 hrs  Reteplase Retavase 10 days 10 days  Rivaroxaban Xarelto 3 days 6 hrs  Ticagrelor Brilinta 5-7 days 6 hrs  Ticlopidine Ticlid 10-14 days 2 hrs  Tinzaparin Innohep 24 hours 4 hrs  Tirofiban Aggrastat 8 hours 2 hrs  Warfarin Coumadin 5 days 2 hrs   Other medications with blood-thinning effects  Product indications Generic (Brand) names Note  Cholesterol Lipitor Stop 4 days before procedure    Blood thinner (injectable) Heparin (LMW or LMWH Heparin) Stop 24 hours before procedure  Cancer Ibrutinib (Imbruvica) Stop 7 days before procedure   Malaria/Rheumatoid Hydroxychloroquine (Plaquenil) Stop 11 days before procedure  Thrombolytics  10 days before or after procedures   Over-the-counter (OTC) Products with blood-thinning effects  Product Common names Stop Time  Aspirin > 325 mg Goody Powders, Excedrin, etc. 11 days  Aspirin ? 81 mg  7 days  Fish oil  4 days  Garlic supplements  7 days  Ginkgo biloba  36 hours  Ginseng  24 hours  NSAIDs Ibuprofen, Naprosyn, etc. 3 days  Vitamin E  4 days   ____________________________________________________________________________________________    

## 2022-09-29 NOTE — Progress Notes (Signed)
Safety precautions to be maintained throughout the outpatient stay will include: orient to surroundings, keep bed in low position, maintain call bell within reach at all times, provide assistance with transfer out of bed and ambulation.  

## 2022-10-04 DIAGNOSIS — E1165 Type 2 diabetes mellitus with hyperglycemia: Secondary | ICD-10-CM | POA: Diagnosis not present

## 2022-10-04 DIAGNOSIS — I10 Essential (primary) hypertension: Secondary | ICD-10-CM | POA: Diagnosis not present

## 2022-10-04 DIAGNOSIS — M5136 Other intervertebral disc degeneration, lumbar region: Secondary | ICD-10-CM | POA: Diagnosis not present

## 2022-10-04 DIAGNOSIS — I700 Atherosclerosis of aorta: Secondary | ICD-10-CM | POA: Diagnosis not present

## 2022-10-12 ENCOUNTER — Ambulatory Visit
Payer: PPO | Attending: Student in an Organized Health Care Education/Training Program | Admitting: Student in an Organized Health Care Education/Training Program

## 2022-10-12 ENCOUNTER — Ambulatory Visit
Admission: RE | Admit: 2022-10-12 | Discharge: 2022-10-12 | Disposition: A | Discharge status: Home / Self Care | Payer: PPO | Source: Ambulatory Visit | Attending: Student in an Organized Health Care Education/Training Program | Admitting: Student in an Organized Health Care Education/Training Program

## 2022-10-12 ENCOUNTER — Encounter: Payer: Self-pay | Admitting: Student in an Organized Health Care Education/Training Program

## 2022-10-12 VITALS — BP 121/93 | HR 80 | Temp 97.2°F | Resp 17 | Ht 63.0 in | Wt 201.0 lb

## 2022-10-12 DIAGNOSIS — M47816 Spondylosis without myelopathy or radiculopathy, lumbar region: Secondary | ICD-10-CM | POA: Insufficient documentation

## 2022-10-12 DIAGNOSIS — M5459 Other low back pain: Secondary | ICD-10-CM | POA: Insufficient documentation

## 2022-10-12 MED ORDER — LIDOCAINE HCL 2 % IJ SOLN
20.0000 mL | Freq: Once | INTRAMUSCULAR | Status: AC
  Administered 2022-10-12: 400 mg

## 2022-10-12 MED ORDER — DEXAMETHASONE SODIUM PHOSPHATE 10 MG/ML IJ SOLN
10.0000 mg | Freq: Once | INTRAMUSCULAR | Status: AC
  Administered 2022-10-12: 10 mg

## 2022-10-12 MED ORDER — MIDAZOLAM HCL 2 MG/2ML IJ SOLN
0.5000 mg | Freq: Once | INTRAMUSCULAR | Status: AC
  Administered 2022-10-12: 2 mg via INTRAVENOUS

## 2022-10-12 MED ORDER — DEXAMETHASONE SODIUM PHOSPHATE 10 MG/ML IJ SOLN
INTRAMUSCULAR | Status: AC
  Filled 2022-10-12: qty 2

## 2022-10-12 MED ORDER — LACTATED RINGERS IV SOLN: Freq: Once | INTRAVENOUS | Status: AC

## 2022-10-12 MED ORDER — ROPIVACAINE HCL 2 MG/ML IJ SOLN
9.0000 mL | Freq: Once | INTRAMUSCULAR | Status: AC
  Administered 2022-10-12: 9 mL via PERINEURAL

## 2022-10-12 MED ORDER — LIDOCAINE HCL 2 % IJ SOLN
INTRAMUSCULAR | Status: AC
  Filled 2022-10-12: qty 20

## 2022-10-12 MED ORDER — MIDAZOLAM HCL 2 MG/2ML IJ SOLN
INTRAMUSCULAR | Status: AC
  Filled 2022-10-12: qty 2

## 2022-10-12 MED ORDER — ROPIVACAINE HCL 2 MG/ML IJ SOLN
INTRAMUSCULAR | Status: AC
  Filled 2022-10-12: qty 20

## 2022-10-12 NOTE — Patient Instructions (Addendum)
____________________________________________________________________________________________  Post-Procedure Discharge Instructions  Instructions: Apply ice:  Purpose: This will minimize any swelling and discomfort after procedure.  When: Day of procedure, as soon as you get home. How: Fill a plastic sandwich bag with crushed ice. Cover it with a small towel and apply to injection site. How long: (15 min on, 15 min off) Apply for 15 minutes then remove x 15 minutes.  Repeat sequence on day of procedure, until you go to bed. Apply heat:  Purpose: To treat any soreness and discomfort from the procedure. When: Starting the next day after the procedure. How: Apply heat to procedure site starting the day following the procedure. How long: May continue to repeat daily, until discomfort goes away. Food intake: Start with clear liquids (like water) and advance to regular food, as tolerated.  Physical activities: Keep activities to a minimum for the first 8 hours after the procedure. After that, then as tolerated. Driving: If you have received any sedation, be responsible and do not drive. You are not allowed to drive for 24 hours after having sedation. Blood thinner: (Applies only to those taking blood thinners) You may restart your blood thinner 6 hours after your procedure. Insulin: (Applies only to Diabetic patients taking insulin) As soon as you can eat, you may resume your normal dosing schedule. Infection prevention: Keep procedure site clean and dry. Shower daily and clean area with soap and water. Post-procedure Pain Diary: Extremely important that this be done correctly and accurately. Recorded information will be used to determine the next step in treatment. For the purpose of accuracy, follow these rules: Evaluate only the area treated. Do not report or include pain from an untreated area. For the purpose of this evaluation, ignore all other areas of pain, except for the treated area. After  your procedure, avoid taking a long nap and attempting to complete the pain diary after you wake up. Instead, set your alarm clock to go off every hour, on the hour, for the initial 8 hours after the procedure. Document the duration of the numbing medicine, and the relief you are getting from it. Do not go to sleep and attempt to complete it later. It will not be accurate. If you received sedation, it is likely that you were given a medication that may cause amnesia. Because of this, completing the diary at a later time may cause the information to be inaccurate. This information is needed to plan your care. Follow-up appointment: Keep your post-procedure follow-up evaluation appointment after the procedure (usually 2 weeks for most procedures, 6 weeks for radiofrequencies). DO NOT FORGET to bring you pain diary with you.   Expect: (What should I expect to see with my procedure?) From numbing medicine (AKA: Local Anesthetics): Numbness or decrease in pain. You may also experience some weakness, which if present, could last for the duration of the local anesthetic. Onset: Full effect within 15 minutes of injected. Duration: It will depend on the type of local anesthetic used. On the average, 1 to 8 hours.  From steroids (Applies only if steroids were used): Decrease in swelling or inflammation. Once inflammation is improved, relief of the pain will follow. Onset of benefits: Depends on the amount of swelling present. The more swelling, the longer it will take for the benefits to be seen. In some cases, up to 10 days. Duration: Steroids will stay in the system x 2 weeks. Duration of benefits will depend on multiple posibilities including persistent irritating factors. Side-effects: If present, they   may typically last 2 weeks (the duration of the steroids). Frequent: Cramps (if they occur, drink Gatorade and take over-the-counter Magnesium 450-500 mg once to twice a day); water retention with temporary  weight gain; increases in blood sugar; decreased immune system response; increased appetite. Occasional: Facial flushing (red, warm cheeks); mood swings; menstrual changes. Uncommon: Long-term decrease or suppression of natural hormones; bone thinning. (These are more common with higher doses or more frequent use. This is why we prefer that our patients avoid having any injection therapies in other practices.)  Very Rare: Severe mood changes; psychosis; aseptic necrosis. From procedure: Some discomfort is to be expected once the numbing medicine wears off. This should be minimal if ice and heat are applied as instructed.  Call if: (When should I call?) You experience numbness and weakness that gets worse with time, as opposed to wearing off. New onset bowel or bladder incontinence. (Applies only to procedures done in the spine)  Emergency Numbers: Durning business hours (Monday - Thursday, 8:00 AM - 4:00 PM) (Friday, 9:00 AM - 12:00 Noon): [phone] After hours: [phone] NOTE: If you are having a problem and are unable connect with, or to talk to a provider, then go to your nearest urgent care or emergency department. If the problem is serious and urgent, please call 911. ____________________________________________________________________________________________  Facet Blocks Patient Information  Description: The facets are joints in the spine between the vertebrae.  Like any joints in the body, facets can become irritated and painful.  Arthritis can also effect the facets.  By injecting steroids and local anesthetic in and around these joints, we can temporarily block the nerve supply to them.  Steroids act directly on irritated nerves and tissues to reduce selling and inflammation which often leads to decreased pain.  Facet blocks may be done anywhere along the spine from the neck to the low back depending upon the location of your pain.   After numbing the skin with local anesthetic  (like Novocaine), a small needle is passed onto the facet joints under x-ray guidance.  You may experience a sensation of pressure while this is being done.  The entire block usually lasts about 15-25 minutes.   Conditions which may be treated by facet blocks:  Low back/buttock pain Neck/shoulder pain Certain types of headaches  Preparation for the injection:  Do not eat any solid food or dairy products within 8 hours of your appointment. You may drink clear liquid up to 3 hours before appointment.  Clear liquids include water, black coffee, juice or soda.  No milk or cream please. You may take your regular medication, including pain medications, with a sip of water before your appointment.  Diabetics should hold regular insulin (if taken separately) and take 1/2 normal NPH dose the morning of the procedure.  Carry some sugar containing items with you to your appointment. A driver must accompany you and be prepared to drive you home after your procedure. Bring all your current medications with you. An IV may be inserted and sedation may be given at the discretion of the physician. A blood pressure cuff, EKG and other monitors will often be applied during the procedure.  Some patients may need to have extra oxygen administered for a short period. You will be asked to provide medical information, including your allergies and medications, prior to the procedure.  We must know immediately if you are taking blood thinners (like Coumadin/Warfarin) or if you are allergic to IV iodine contrast (dye).    We must know if you could possible be pregnant.  Possible side-effects:  Bleeding from needle site Infection (rare, may require surgery) Nerve injury (rare) Numbness & tingling (temporary) Difficulty urinating (rare, temporary) Spinal headache (a headache worse with upright posture) Light-headedness (temporary) Pain at injection site (serveral days) Decreased blood pressure (rare,  temporary) Weakness in arm/leg (temporary) Pressure sensation in back/neck (temporary)   Call if you experience:  Fever/chills associated with headache or increased back/neck pain Headache worsened by an upright position New onset, weakness or numbness of an extremity below the injection site Hives or difficulty breathing (go to the emergency room) Inflammation or drainage at the injection site(s) Severe back/neck pain greater than usual New symptoms which are concerning to you  Please note:  Although the local anesthetic injected can often make your back or neck feel good for several hours after the injection, the pain will likely return. It takes 3-7 days for steroids to work.  You may not notice any pain relief for at least one week.  If effective, we will often do a series of 2-3 injections spaced 3-6 weeks apart to maximally decrease your pain.  After the initial series, you may be a candidate for a more permanent nerve block of the facets.  If you have any questions, please call [phone] [facility] 

## 2022-10-12 NOTE — Progress Notes (Signed)
PROVIDER NOTE: Interpretation of information contained herein should be left to medically-trained personnel. Specific patient instructions are provided elsewhere under "Patient Instructions" section of medical record. This document was created in part using STT-dictation technology, any transcriptional errors that may result from this process are unintentional.  Patient: Kara [name] Type: Established DOB: [birth year] MRN: [id number] PCP: Barbette Reichmann, MD  Service: Procedure DOS: 10/12/2022 Setting: Ambulatory Location: Ambulatory outpatient facility Delivery: Face-to-face Provider: Edward Jolly, MD Specialty: Interventional Pain Management Specialty designation: 09 Location: Outpatient facility Ref. Prov.: Barbette Reichmann, MD       Interventional Therapy   Procedure: Lumbar Facet, Medial Branch Block(s) #3 of 2024 (total: #24 since 2019) Laterality: Bilateral  Level: L2, L3, L4, and L5 Medial Branch Level(s). Injecting these levels blocks the L3-4, L4-5, and L5-S1 lumbar facet joints. Imaging: Fluoroscopic guidance         Anesthesia: Local anesthesia (1-2% Lidocaine) Anxiolysis: IV Versed 2.0 mg DOS: 10/12/2022 Performed by: Edward Jolly, MD  Primary Purpose: Diagnostic/Therapeutic Indications: Low back pain severe enough to impact quality of life or function. 1. Lumbar facet syndrome (Bilateral)   2. Lumbar facet arthropathy (L3-4, L4-5, and L5-S1) (Bilateral)   3. Spondylosis without myelopathy or radiculopathy, lumbosacral region   4. Grade 1-2 Anterolisthesis of L4/L5 & L5/S1 (stable as of 11/05/2021)   5. DDD (degenerative disc disease), lumbosacral   6. Chronic low back pain (1ry area of Pain) (Bilateral) w/o sciatica    Chronic anticoagulation (COUMADIN)    Morbid obesity with BMI of 40.0-44.9, adult (HCC)    NAS-11 Pain score:   Pre-procedure: 10-Worst pain ever/10   Post-procedure: 10-Worst pain ever/10  Last dose of Coumadin was Wednesday    Position  / Prep / Materials:  Position: Prone  Prep solution: DuraPrep (Iodine Povacrylex [0.7% available iodine] and Isopropyl Alcohol, 74% w/w) Area Prepped: Posterolateral Lumbosacral Spine (Wide prep: From the lower border of the scapula down to the end of the tailbone and from flank to flank.)  Materials:  Tray: Block Needle(s):  Type: Spinal  Gauge (G): 22  Length: 5-in Qty: 3      Pre-op H&P Assessment:  Kara [name] is a 72 y.o. (year old), female patient, seen today for interventional treatment. She  has a past surgical history that includes Knee surgery; Carpal tunnel release (Bilateral); Colon resection sigmoid (N/A, 11/02/2016); Colostomy (N/A, 11/02/2016); Sigmoidoscopy (N/A, 11/13/2016); PULMONARY VENOGRAPHY (N/A, 11/19/2016); Colonoscopy with propofol (N/A, 02/03/2017); Colon surgery (11/02/2016); IVC FILTER INSERTION (Right, 10/2016); Colostomy reversal (N/A, 02/24/2017); Appendectomy (N/A, 02/24/2017); Lysis of adhesion (N/A, 02/24/2017); laparoscopy (N/A, 02/24/2017); Ileo loop diversion (N/A, 02/24/2017); Sigmoidoscopy (N/A, 05/19/2017); Ileostomy closure (N/A, 06/01/2017); IVC FILTER REMOVAL (N/A, 08/09/2017); Sacroplasty (N/A, 11/08/2017); Cataract extraction w/PHACO (Left, 11/21/2018); Cataract extraction w/PHACO (Right, 12/12/2018); and Video bronchoscopy with endobronchial navigation (N/A, 02/23/2021). Kara [name] has a current medication list which includes the following prescription(s): alendronate, candesartan, cholecalciferol, vitamin b-12, cyclobenzaprine, dulaglutide, gabapentin, gabapentin, glipizide, hydrocodone-acetaminophen, magnesium oxide -mg supplement, meclizine, oxybutynin, ropinirole, warfarin, and calcium carbonate, and the following Facility-Administered Medications: lactated ringers. Her primarily concern today is the Back Pain  Initial Vital Signs:  Pulse/HCG Rate: 80ECG Heart Rate: 79 Temp: (!) 97.2 F (36.2 C) Resp: 18 BP: (!) 115/95 SpO2: 98 %  BMI: Estimated body mass  index is 35.61 kg/m as calculated from the following:   Height as of this encounter: 5\' 3"  (1.6 m).   Weight as of this encounter: 201 lb (91.2 kg).  Risk Assessment: Allergies: Reviewed. She is allergic to adhesive [  tape], other, latex, and morphine and codeine.  Allergy Precautions: None required Coagulopathies: Reviewed. None identified.  Blood-thinner therapy: None at this time Active Infection(s): Reviewed. None identified. Kara [name] is afebrile  Site Confirmation: Kara [name] was asked to confirm the procedure and laterality before marking the site Procedure checklist: Completed Consent: Before the procedure and under the influence of no sedative(s), amnesic(s), or anxiolytics, the patient was informed of the treatment options, risks and possible complications. To fulfill our ethical and legal obligations, as recommended by the American Medical Association's Code of Ethics, I have informed the patient of my clinical impression; the nature and purpose of the treatment or procedure; the risks, benefits, and possible complications of the intervention; the alternatives, including doing nothing; the risk(s) and benefit(s) of the alternative treatment(s) or procedure(s); and the risk(s) and benefit(s) of doing nothing. The patient was provided information about the general risks and possible complications associated with the procedure. These may include, but are not limited to: failure to achieve desired goals, infection, bleeding, organ or nerve damage, allergic reactions, paralysis, and death. In addition, the patient was informed of those risks and complications associated to Spine-related procedures, such as failure to decrease pain; infection (i.e.: Meningitis, epidural or intraspinal abscess); bleeding (i.e.: epidural hematoma, subarachnoid hemorrhage, or any other type of intraspinal or peri-dural bleeding); organ or nerve damage (i.e.: Any type of peripheral nerve, nerve root, or spinal  cord injury) with subsequent damage to sensory, motor, and/or autonomic systems, resulting in permanent pain, numbness, and/or weakness of one or several areas of the body; allergic reactions; (i.e.: anaphylactic reaction); and/or death. Furthermore, the patient was informed of those risks and complications associated with the medications. These include, but are not limited to: allergic reactions (i.e.: anaphylactic or anaphylactoid reaction(s)); adrenal axis suppression; blood sugar elevation that in diabetics may result in ketoacidosis or comma; water retention that in patients with history of congestive heart failure may result in shortness of breath, pulmonary edema, and decompensation with resultant heart failure; weight gain; swelling or edema; medication-induced neural toxicity; particulate matter embolism and blood vessel occlusion with resultant organ, and/or nervous system infarction; and/or aseptic necrosis of one or more joints. Finally, the patient was informed that Medicine is not an exact science; therefore, there is also the possibility of unforeseen or unpredictable risks and/or possible complications that may result in a catastrophic outcome. The patient indicated having understood very clearly. We have given the patient no guarantees and we have made no promises. Enough time was given to the patient to ask questions, all of which were answered to the patient's satisfaction. Kara [name] has indicated that she wanted to continue with the procedure. Attestation: I, the ordering provider, attest that I have discussed with the patient the benefits, risks, side-effects, alternatives, likelihood of achieving goals, and potential problems during recovery for the procedure that I have provided informed consent. Date  Time: 10/12/2022  8:10 AM   Pre-Procedure Preparation:  Monitoring: As per clinic protocol. Respiration, ETCO2, SpO2, BP, heart rate and rhythm monitor placed and checked for adequate  function Safety Precautions: Patient was assessed for positional comfort and pressure points before starting the procedure. Time-out: I initiated and conducted the "Time-out" before starting the procedure, as per protocol. The patient was asked to participate by confirming the accuracy of the "Time Out" information. Verification of the correct person, site, and procedure were performed and confirmed by me, the nursing staff, and the patient. "Time-out" conducted as per Joint Commission's Universal Protocol (UP.01.01.01).  Time: 0840 Start Time: 0840 hrs.  Description of Procedure:          Laterality: (see above) Targeted Levels: (see above)  Safety Precautions: Aspiration looking for blood return was conducted prior to all injections. At no point did we inject any substances, as a needle was being advanced. Before injecting, the patient was told to immediately notify me if she was experiencing any new onset of "ringing in the ears, or metallic taste in the mouth". No attempts were made at seeking any paresthesias. Safe injection practices and needle disposal techniques used. Medications properly checked for expiration dates. SDV (single dose vial) medications used. After the completion of the procedure, all disposable equipment used was discarded in the proper designated medical waste containers. Local Anesthesia: Protocol guidelines were followed. The patient was positioned over the fluoroscopy table. The area was prepped in the usual manner. The time-out was completed. The target area was identified using fluoroscopy. A 12-in long, straight, sterile hemostat was used with fluoroscopic guidance to locate the targets for each level blocked. Once located, the skin was marked with an approved surgical skin marker. Once all sites were marked, the skin (epidermis, dermis, and hypodermis), as well as deeper tissues (fat, connective tissue and muscle) were infiltrated with a small amount of a short-acting local  anesthetic, loaded on a 10cc syringe with a 25G, 1.5-in  Needle. An appropriate amount of time was allowed for local anesthetics to take effect before proceeding to the next step. Local Anesthetic: Lidocaine 2.0% The unused portion of the local anesthetic was discarded in the proper designated containers. Technical description of process:  L2 Medial Branch Nerve Block (MBB): The target area for the L2 medial branch is at the junction of the postero-lateral aspect of the superior articular process and the superior, posterior, and medial edge of the transverse process of L3. Under fluoroscopic guidance, a Quincke needle was inserted until contact was made with os over the superior postero-lateral aspect of the pedicular shadow (target area). After negative aspiration for blood, 1 mL of the nerve block solution was injected without difficulty or complication. The needle was removed intact. L3 Medial Branch Nerve Block (MBB): The target area for the L3 medial branch is at the junction of the postero-lateral aspect of the superior articular process and the superior, posterior, and medial edge of the transverse process of L4. Under fluoroscopic guidance, a Quincke needle was inserted until contact was made with os over the superior postero-lateral aspect of the pedicular shadow (target area). After negative aspiration for blood, 1mL of the nerve block solution was injected without difficulty or complication. The needle was removed intact. L4 Medial Branch Nerve Block (MBB): The target area for the L4 medial branch is at the junction of the postero-lateral aspect of the superior articular process and the superior, posterior, and medial edge of the transverse process of L5. Under fluoroscopic guidance, a Quincke needle was inserted until contact was made with os over the superior postero-lateral aspect of the pedicular shadow (target area). After negative aspiration for blood, 1mL of the nerve block solution was  injected without difficulty or complication. The needle was removed intact. L5 Medial Branch Nerve Block (MBB): The target area for the L5 medial branch is at the junction of the postero-lateral aspect of the superior articular process and the superior, posterior, and medial edge of the sacral ala. Under fluoroscopic guidance, a Quincke needle was inserted until contact was made with os over the superior postero-lateral aspect of  the pedicular shadow (target area). After negative aspiration for blood, 1 mL of the nerve block solution was injected without difficulty or complication. The needle was removed intact.   Once the entire procedure was completed, the treated area was cleaned, making sure to leave some of the prepping solution back to take advantage of its long term bactericidal properties.         Illustration of the posterior view of the lumbar spine and the posterior neural structures. Laminae of L2 through S1 are labeled. DPRL5, dorsal primary ramus of L5; DPRS1, dorsal primary ramus of S1; DPR3, dorsal primary ramus of L3; FJ, facet (zygapophyseal) joint L3-L4; I, inferior articular process of L4; LB1, lateral branch of dorsal primary ramus of L1; IAB, inferior articular branches from L3 medial branch (supplies L4-L5 facet joint); IBP, intermediate branch plexus; MB3, medial branch of dorsal primary ramus of L3; NR3, third lumbar nerve root; S, superior articular process of L5; SAB, superior articular branches from L4 (supplies L4-5 facet joint also); TP3, transverse process of L3.   Facet Joint Innervation (* possible contribution)  L1-2 T12, L1 (L2*)  Medial Branch  L2-3 L1, L2 (L3*)         "          "  L3-4 L2, L3 (L4*)         "          "  L4-5 L3, L4 (L5*)         "          "  L5-S1 L4, L5, S1          "          "    Vitals:   10/12/22 0842 10/12/22 0847 10/12/22 0852 10/12/22 0857  BP: (!) 152/89 133/86 (!) 143/86 (!) 121/93  Pulse:      Resp: 18 16 18 17   Temp:       TempSrc:      SpO2: 95% 95% 96% 95%  Weight:      Height:         End Time: 0855 hrs.  Imaging Guidance (Spinal):          Type of Imaging Technique: Fluoroscopy Guidance (Spinal) Indication(s): Assistance in needle guidance and placement for procedures requiring needle placement in or near specific anatomical locations not easily accessible without such assistance. Exposure Time: Please see nurses notes. Contrast: None used. Fluoroscopic Guidance: I was personally present during the use of fluoroscopy. "Tunnel Vision Technique" used to obtain the best possible view of the target area. Parallax error corrected before commencing the procedure. "Direction-depth-direction" technique used to introduce the needle under continuous pulsed fluoroscopy. Once target was reached, antero-posterior, oblique, and lateral fluoroscopic projection used confirm needle placement in all planes. Images permanently stored in EMR. Interpretation: No contrast injected. I personally interpreted the imaging intraoperatively. Adequate needle placement confirmed in multiple planes. Permanent images saved into the patient's record.  Post-operative Assessment:  Post-procedure Vital Signs:  Pulse/HCG Rate: 8077 Temp: (!) 97.2 F (36.2 C) Resp: 17 BP: (!) 121/93 SpO2: 95 %  EBL: None  Complications: No immediate post-treatment complications observed by team, or reported by patient.  Note: The patient tolerated the entire procedure well. A repeat set of vitals were taken after the procedure and the patient was kept under observation following institutional policy, for this type of procedure. Post-procedural neurological assessment was performed, showing return to baseline, prior to discharge. The patient was provided with  post-procedure discharge instructions, including a section on how to identify potential problems. Should any problems arise concerning this procedure, the patient was given instructions to  immediately contact us, at any time, without hesitation. In any case, we plan to contact the patient by telephone for a follow-up status report regarding this interventional procedure.  Comments:  No additional relevant information.  Plan of Care (POC)  Patient instructed to restart Coumadin tomorrow.  Orders:  Orders Placed This Encounter  Procedures   DG PAIN [facility] C-ARM 1-60 MIN NO REPORT    Intraoperative interpretation by procedural physician at [facility].    Standing Status:   Standing    Number of Occurrences:   1    Order Specific Question:   Reason for exam:    Answer:   Assistance in needle guidance and placement for procedures requiring needle placement in or near specific anatomical locations not easily accessible without such assistance.    Medications ordered for procedure: Meds ordered this encounter  Medications   lidocaine (XYLOCAINE) 2 % (with pres) injection 400 mg   lactated ringers infusion   midazolam (VERSED) injection 0.5-2 mg    Make sure Flumazenil is available in the pyxis when using this medication. If oversedation occurs, administer 0.2 mg IV over 15 sec. If after 45 sec no response, administer 0.2 mg again over 1 min; may repeat at 1 min intervals; not to exceed 4 doses (1 mg)   ropivacaine (PF) 2 mg/mL (0.2%) (NAROPIN) injection 9 mL   ropivacaine (PF) 2 mg/mL (0.2%) (NAROPIN) injection 9 mL   dexamethasone (DECADRON) injection 10 mg   dexamethasone (DECADRON) injection 10 mg   Medications administered: We administered lidocaine, lactated ringers, midazolam, ropivacaine (PF) 2 mg/mL (0.2%), ropivacaine (PF) 2 mg/mL (0.2%), dexamethasone, and dexamethasone.  See the medical record for exact dosing, route, and time of administration.  Follow-up plan:   Return if symptoms worsen or fail to improve.       Recent Visits Date Type Provider Dept  09/29/22 Office Visit Delano Metz, MD [name]  09/02/22 Office Visit  Delano Metz, MD [name]  08/17/22 Procedure visit Delano Metz, MD [name]  08/05/22 Office Visit Delano Metz, MD [name]  Showing recent visits within past 90 days and meeting all other requirements Today's Visits Date Type Provider Dept  10/12/22 Procedure visit Edward Jolly, MD [name]  Showing today's visits and meeting all other requirements Future Appointments No visits were found meeting these conditions. Showing future appointments within next 90 days and meeting all other requirements  Disposition: Discharge home  Discharge (Date  Time): 10/12/2022; 0905 hrs.   Primary Care Physician: Barbette Reichmann, MD Location: [facility] Note by: Edward Jolly, MD (TTS technology used. I apologize for any typographical errors that were not detected and corrected.) Date: 10/12/2022; Time: 9:31 AM  Disclaimer:  Medicine is not an Visual merchandiser. The only guarantee in medicine is that nothing is guaranteed. It is important to note that the decision to proceed with this intervention was based on the information collected from the patient. The Data and conclusions were drawn from the patient's questionnaire, the interview, and the physical examination. Because the information was provided in large part by the patient, it cannot be guaranteed that it has not been purposely or unconsciously manipulated. Every effort has been made to obtain as much relevant data as possible for this evaluation. It is important to note that the  conclusions that lead to this procedure are derived in large part from the available data. Always take into account that the treatment will also be dependent on availability of resources and existing treatment guidelines, considered by other Pain Management Practitioners as being common knowledge and practice, at the time of the intervention. For Medico-Legal purposes, it is also  important to point out that variation in procedural techniques and pharmacological choices are the acceptable norm. The indications, contraindications, technique, and results of the above procedure should only be interpreted and judged by a Board-Certified Interventional Pain Specialist with extensive familiarity and expertise in the same exact procedure and technique.

## 2022-10-12 NOTE — Progress Notes (Signed)
Safety precautions to be maintained throughout the outpatient stay will include: orient to surroundings, keep bed in low position, maintain call bell within reach at all times, provide assistance with transfer out of bed and ambulation.  

## 2022-10-27 DIAGNOSIS — Z86711 Personal history of pulmonary embolism: Secondary | ICD-10-CM | POA: Diagnosis not present

## 2022-10-27 DIAGNOSIS — Z7901 Long term (current) use of anticoagulants: Secondary | ICD-10-CM | POA: Diagnosis not present

## 2022-10-27 DIAGNOSIS — R791 Abnormal coagulation profile: Secondary | ICD-10-CM | POA: Diagnosis not present

## 2022-10-31 NOTE — Progress Notes (Unsigned)
PROVIDER NOTE: Information contained herein reflects review and annotations entered in association with encounter. Interpretation of such information and data should be left to medically-trained personnel. Information provided to patient can be located elsewhere in the medical record under "Patient Instructions". Document created using STT-dictation technology, any transcriptional errors that may result from process are unintentional.    Patient: Kara [name]  Service Category: E/M  Provider: Oswaldo Done, MD  DOB: 04-22-1951  DOS: 11/01/2022  Referring Provider: Barbette Reichmann, MD  MRN: [id number]  Specialty: Interventional Pain Management  PCP: Kara Reichmann, MD  Type: Established Patient  Setting: Ambulatory outpatient    Location: Office  Delivery: Face-to-face     HPI  [name], a 72 y.o. year old female, is here today because of her No primary diagnosis found.. [name] primary complain today is No chief complaint on file.  Pertinent problems: 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); Lumbar pars defect (L3 and L4) (Bilateral); Diabetic peripheral neuropathy (HCC); Chronic lower extremity pain (2ry area of Pain) (Bilateral); Chronic radicular pain of lower extremity; Osteoarthritis of hip (Right); Chronic low back pain (1ry area of Pain) (Bilateral) w/o sciatica; Spondylosis without myelopathy or radiculopathy,  lumbosacral region; Other specified dorsopathies, sacral and sacrococcygeal region; Secondary osteoarthritis of multiple sites; Sacral insufficiency fracture; DDD (degenerative disc disease), lumbosacral; Coccygodynia; Chronic sacroiliac joint pain (Left); Osteoarthritis of lumbar spine; Spondylolisthesis, lumbosacral region; Abnormal MRI, lumbar spine (08/09/2019); Spasm of muscle of lower back; Chronic hip pain (Bilateral) (R>L); Osteoarthritis of hips (Bilateral); Greater trochanteric bursitis (Right); Chronic hip pain (Right); Chronic low back pain (Right) w/o sciatica; Greater trochanteric bursitis (Left); Other bursitis of hip, not elsewhere classified (gluteus medius bursa) (Left); and Lumbar facet joint pain on their pertinent problem list. Pain Assessment: Severity of   is reported as a  /10. Location:    / . Onset:  . Quality:  . Timing:  . Modifying factor(s):  Marland Kitchen Vitals:  vitals were not taken for this visit.  BMI: Estimated body mass index is 35.61 kg/m as calculated from the following:   Height as of 10/12/22: 5\' 3"  (1.6 m).   Weight as of 10/12/22: 201 lb (91.2 kg). Last encounter: 09/29/2022. Last procedure: 08/17/2022.  Reason for encounter:  *** . ***  Pharmacotherapy Assessment  Analgesic: No chronic opioid analgesics therapy prescribed by our practice, 2ry to Medication Agreement Violation. Oxycodone IR 10 mg, 1 tab PO qd,  from PCP.  (Getting oxycodone from PCP in multiple occasions, according to PMP)(11/21/20; 12/26/20; 01/16/21; 02/13/21). Opioid analgesic pharmacotherapy D/C'ed on 03/03/2021. MME: 20 mg/day.   Monitoring: [place] PMP: PDMP reviewed during this encounter.       Pharmacotherapy: No side-effects or adverse reactions reported. Compliance: No problems identified. Effectiveness: Clinically acceptable.  No notes on file  No results found for: "CBDTHCR" No results found for: "D8THCCBX" No results found for: "D9THCCBX"  UDS:  Summary  Date Value Ref Range Status   11/12/2020 Note  Final    Comment:    ==================================================================== [employer] [facility] [employer] (MW) ==================================================================== Test  Result       Flag       Units  Drug Present and Declared for Prescription Verification   Norhydrocodone                 61           EXPECTED   ng/mg creat    Norhydrocodone is an expected metabolite of hydrocodone.    Tramadol                       >4202        EXPECTED   ng/mg creat   O-Desmethyltramadol            >4202        EXPECTED   ng/mg creat   N-Desmethyltramadol            1712         EXPECTED   ng/mg creat    Source of tramadol is a prescription medication. O-desmethyltramadol    and N-desmethyltramadol are expected metabolites of tramadol.  Drug Absent but Declared for Prescription Verification   Hydrocodone                    Not Detected UNEXPECTED ng/mg creat    Hydrocodone is almost always present in patients taking this drug    consistently. Absence of hydrocodone could be due to lapse of time    since the last dose or unusual pharmacokinetics (rapid metabolism).  ==================================================================== Test                      Result    Flag   Units      Ref Range   Creatinine              119              mg/dL      >=82 ==================================================================== Declared Medications:  The flagging and interpretation on this report are based on the  following declared medications.  Unexpected results may arise from  inaccuracies in the declared medications.   **Note: The testing scope of this panel includes these medications:   Hydrocodone  Tramadol   **Note: The testing scope of this panel does not include the  following reported medications:   Acetaminophen  Alendronate (Fosamax)  Aspirin  Calcium  Candesartan  Cholecalciferol  Cyanocobalamin   Cyclobenzaprine  Dulaglutide  Gabapentin  Glipizide  Meclizine  Multivitamin  Nystatin  Ondansetron  Ropinirole  Tolterodine  Warfarin ==================================================================== For clinical consultation, please call [phone]. ====================================================================       ROS  Constitutional: Denies any fever or chills Gastrointestinal: No reported hemesis, hematochezia, vomiting, or acute GI distress Musculoskeletal: Denies any acute onset joint swelling, redness, loss of ROM, or weakness Neurological: No reported episodes of acute onset apraxia, aphasia, dysarthria, agnosia, amnesia, paralysis, loss of coordination, or loss of consciousness  Medication Review  Dulaglutide, HYDROcodone-acetaminophen, Magnesium Oxide -Mg Supplement, Vitamin B-12, alendronate, calcium carbonate, candesartan, cholecalciferol, cyclobenzaprine, gabapentin, glipiZIDE, meclizine, oxybutynin, rOPINIRole, and warfarin  History Review  Allergy: Ms. Mikita is allergic to adhesive [tape], other, latex, and morphine and codeine. Drug: Ms. Russo  reports no history of drug use. Alcohol:  reports no history of alcohol use. Tobacco:  reports that she has never smoked. She has never used smokeless tobacco. Social: Ms. Angermeier  reports that she has never smoked. She has never used smokeless tobacco. She reports that she does not  drink alcohol and does not use drugs. Medical:  has a past medical history of Anginal pain (HCC), Arthritis, CHF (congestive heart failure) (HCC), Diabetes (HCC), Diverticulitis (2018), History of being hospitalized, History of hiatal hernia, Hypertension, Osteoporosis, PE (pulmonary thromboembolism) (HCC), 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HCC), and Vertigo. Surgical: Ms. Burback  has a past surgical history that includes Knee surgery; Carpal tunnel release (Bilateral); Colon resection sigmoid (N/A, 11/02/2016); Colostomy  (N/A, 11/02/2016); Sigmoidoscopy (N/A, 11/13/2016); PULMONARY VENOGRAPHY (N/A, 11/19/2016); Colonoscopy with propofol (N/A, 02/03/2017); Colon surgery (11/02/2016); IVC FILTER INSERTION (Right, 10/2016); Colostomy reversal (N/A, 02/24/2017); Appendectomy (N/A, 02/24/2017); Lysis of adhesion (N/A, 02/24/2017); laparoscopy (N/A, 02/24/2017); Ileo loop diversion (N/A, 02/24/2017); Sigmoidoscopy (N/A, 05/19/2017); Ileostomy closure (N/A, 06/01/2017); IVC FILTER REMOVAL (N/A, 08/09/2017); Sacroplasty (N/A, 11/08/2017); Cataract extraction w/PHACO (Left, 11/21/2018); Cataract extraction w/PHACO (Right, 12/12/2018); and Video bronchoscopy with endobronchial navigation (N/A, 02/23/2021). Family: family history includes Breast cancer in her mother; Cancer in her mother; Diabetes in her mother and sister; Heart disease in her father, mother, and sister.  Laboratory Chemistry Profile   Renal Lab Results  Component Value Date   BUN 10 11/22/2021   CREATININE 0.70 06/21/2022   GFRAA >60 07/09/2019   GFRNONAA >60 11/22/2021    Hepatic Lab Results  Component Value Date   AST 25 11/20/2021   ALT 31 11/20/2021   ALBUMIN 3.4 (L) 11/20/2021   ALKPHOS 38 11/20/2021   LIPASE 22 11/20/2021    Electrolytes Lab Results  Component Value Date   NA 142 11/22/2021   K 3.7 11/22/2021   CL 112 (H) 11/22/2021   CALCIUM 7.7 (L) 11/22/2021   MG 1.7 08/14/2020   PHOS 3.3 11/07/2016    Bone Lab Results  Component Value Date   25OHVITD1 25 (L) 11/02/2017   25OHVITD2 <1.0 11/02/2017   25OHVITD3 25 11/02/2017    Inflammation (CRP: Acute Phase) (ESR: Chronic Phase) Lab Results  Component Value Date   CRP 1.7 (H) 08/15/2020   ESRSEDRATE 12 11/02/2017   LATICACIDVEN 1.7 11/21/2021         Note: Above Lab results reviewed.  Recent Imaging Review  DG PAIN CLINIC C-ARM 1-60 MIN NO REPORT Fluoro was used, but no Radiologist interpretation will be provided.  Please refer to "NOTES" tab for provider progress note. Note:  Reviewed        Physical Exam  General appearance: Well nourished, well developed, and well hydrated. In no apparent acute distress Mental status: Alert, oriented x 3 (person, place, & time)       Respiratory: No evidence of acute respiratory distress Eyes: PERLA Vitals: There were no vitals taken for this visit. BMI: Estimated body mass index is 35.61 kg/m as calculated from the following:   Height as of 10/12/22: 5\' 3"  (1.6 m).   Weight as of 10/12/22: 201 lb (91.2 kg). Ideal: Patient weight not recorded  Assessment   Diagnosis Status  No diagnosis found. Controlled Controlled Controlled   Updated Problems: No problems updated.  Plan of Care  Problem-specific:  No problem-specific Assessment & Plan notes found for this encounter.  Ms. Hanni Aloia Mcgloin has a current medication list which includes the following long-term medication(s): calcium carbonate, candesartan, glipizide, ropinirole, and warfarin.  Pharmacotherapy (Medications Ordered): No orders of the defined types were placed in this encounter.  Orders:  No orders of the defined types were placed in this encounter.  Follow-up plan:   No follow-ups on file.      Interventional Therapies  Risk  Complexity Considerations:  ANTICOAGULATION: Coumadin (Stop: 5 days  Re-start: 2hrs)  Hx DVT & PE ALLERGY: Latex Morbid obesity (class III) (BMI>40)  metabolic syndrome  NIDDM  HTN Difficult Lumbar RFA due to morbid obesity    Planned  Pending: Palliative bilateral lumbar facet MBB [id number]    Under consideration:   Therapeutic bilateral lumbar facet RFA #2 (patient indicates preferring to have only the block without radiofrequency.)   Completed:   Therapeutic bilateral lumbar facet MBB (L3-S1) x1 (04/06/2022) (80/80/90/90)  Diagnostic/therapeutic right IA hip inj. x2 (01/28/2022) (100/100/85/85)  Diagnostic/therapeutic left IA hip inj. x1 (01/28/2022) (100/100/85/85)  Diagnostic/therapeutic right  trochanteric bursa inj. x1 (09/15/2021) (85/85/85)  Diagnostic/therapeutic left trochanteric bursa inj. x1 (01/28/2022) (100/100/85/85)  Therapeutic left lumbar facet RFA x1 (07/21/2021) (100/100/100 x1 week/80) (Lasted 9 months)  Therapeutic right lumbar facet RFA x1 (07/07/2021) (100/100/80/80) (Lasted 9 months)  Palliative/Therapeutic left L4-5 LESI x1 (11/10/2017) (100/80/0/0-25)  Diagnostic/therapeutic right L4 TFESI x1 (11/19/2021) (90/80/0/0) (fell at [employer]) Palliative left lumbar facet (L2-S1) MBB x18 (06/15/2022) (100/100/75/85)  Palliative right lumbar facet (L2-S1) MBB x25 (08/17/2022) (10-5/10)  Palliative right SI joint block x9 (01/28/2022) (100/100/80/80)  Diagnostic left SI joint block x2 (01/28/2022) (100/100/80/80)  Palliative/Therapeutic (Midline) caudal ESI x3 (06/08/2018) (100/100/100 x 2-3 days/0)    Therapeutic  Palliative (PRN) options:   NO PRN procedures without F2F exam by Dr. Laban Emperor due to multifactorial etiology of chronic pain. No more procedures until after 08/14/2022.    Pharmacotherapy  Nonopioids transferred 03/27/2020: Calcium         Recent Visits Date Type Provider Dept  10/12/22 Procedure visit Edward Jolly, MD [name]  09/29/22 Office Visit Delano Metz, MD [name]  09/02/22 Office Visit Delano Metz, MD [name]  08/17/22 Procedure visit Delano Metz, MD [name]  08/05/22 Office Visit Delano Metz, MD [name]  Showing recent visits within past 90 days and meeting all other requirements Future Appointments Date Type Provider Dept  11/01/22 Appointment Delano Metz, MD [name]  Showing future appointments within next 90 days and meeting all other requirements  I discussed the assessment and treatment plan with the patient. The patient was provided an opportunity to ask questions and all were answered. The patient agreed with the  plan and demonstrated an understanding of the instructions.  Patient advised to call back or seek an in-person evaluation if the symptoms or condition worsens.  Duration of encounter: *** minutes.  Total time on encounter, as per AMA guidelines included both the face-to-face and non-face-to-face time personally spent by the physician and/or other qualified health care professional(s) on the day of the encounter (includes time in activities that require the physician or other qualified health care professional and does not include time in activities normally performed by clinical staff). Physician's time may include the following activities when performed: Preparing to see the patient (e.g., pre-charting review of records, searching for previously ordered imaging, lab work, and nerve conduction tests) Review of prior analgesic pharmacotherapies. Reviewing PMP Interpreting ordered tests (e.g., lab work, imaging, nerve conduction tests) Performing post-procedure evaluations, including interpretation of diagnostic procedures Obtaining and/or reviewing separately obtained history Performing a medically appropriate examination and/or evaluation Counseling and educating the patient/family/caregiver Ordering medications, tests, or procedures Referring and communicating with other health care professionals (when not separately reported) Documenting clinical information in the electronic or other health record Independently interpreting results (not separately reported) and communicating results to the patient/ family/caregiver Care coordination (not separately reported)  Note  by: Kara Done, MD Date: 11/01/2022; Time: 6:43 PM

## 2022-11-01 ENCOUNTER — Encounter: Payer: Self-pay | Admitting: Pain Medicine

## 2022-11-01 ENCOUNTER — Ambulatory Visit: Payer: PPO | Attending: Pain Medicine | Admitting: Pain Medicine

## 2022-11-01 VITALS — BP 129/66 | HR 80 | Temp 97.7°F | Resp 16 | Ht 63.0 in | Wt 201.0 lb

## 2022-11-01 DIAGNOSIS — G8929 Other chronic pain: Secondary | ICD-10-CM | POA: Insufficient documentation

## 2022-11-01 DIAGNOSIS — G894 Chronic pain syndrome: Secondary | ICD-10-CM | POA: Diagnosis not present

## 2022-11-01 DIAGNOSIS — M47816 Spondylosis without myelopathy or radiculopathy, lumbar region: Secondary | ICD-10-CM | POA: Diagnosis not present

## 2022-11-01 DIAGNOSIS — M25551 Pain in right hip: Secondary | ICD-10-CM | POA: Diagnosis not present

## 2022-11-01 DIAGNOSIS — M5459 Other low back pain: Secondary | ICD-10-CM | POA: Diagnosis not present

## 2022-11-01 DIAGNOSIS — M7918 Myalgia, other site: Secondary | ICD-10-CM | POA: Insufficient documentation

## 2022-11-01 DIAGNOSIS — M545 Low back pain, unspecified: Secondary | ICD-10-CM | POA: Diagnosis not present

## 2022-11-01 DIAGNOSIS — Z09 Encounter for follow-up examination after completed treatment for conditions other than malignant neoplasm: Secondary | ICD-10-CM | POA: Insufficient documentation

## 2022-11-01 DIAGNOSIS — Z7901 Long term (current) use of anticoagulants: Secondary | ICD-10-CM | POA: Diagnosis not present

## 2022-11-01 DIAGNOSIS — M25552 Pain in left hip: Secondary | ICD-10-CM | POA: Diagnosis not present

## 2022-11-01 DIAGNOSIS — Z6841 Body Mass Index (BMI) 40.0 and over, adult: Secondary | ICD-10-CM | POA: Insufficient documentation

## 2022-11-01 MED ORDER — KETOROLAC TROMETHAMINE 60 MG/2ML IM SOLN
INTRAMUSCULAR | Status: AC
  Filled 2022-11-01: qty 2

## 2022-11-01 MED ORDER — METHOCARBAMOL 1000 MG/10ML IJ SOLN
200.0000 mg | Freq: Once | INTRAMUSCULAR | Status: AC
  Administered 2022-11-01: 1000 mg via INTRAMUSCULAR

## 2022-11-01 MED ORDER — METHOCARBAMOL 1000 MG/10ML IJ SOLN
INTRAMUSCULAR | Status: AC
  Filled 2022-11-01: qty 10

## 2022-11-01 MED ORDER — KETOROLAC TROMETHAMINE 60 MG/2ML IM SOLN
60.0000 mg | Freq: Once | INTRAMUSCULAR | Status: AC
  Administered 2022-11-01: 60 mg via INTRAMUSCULAR

## 2022-11-01 NOTE — Progress Notes (Signed)
Safety precautions to be maintained throughout the outpatient stay will include: orient to surroundings, keep bed in low position, maintain call bell within reach at all times, provide assistance with transfer out of bed and ambulation.  

## 2022-11-01 NOTE — Patient Instructions (Addendum)
____________________________________________________________________________________________  Blood Thinners  IMPORTANT NOTICE:  If you take any of these, make sure to notify the nursing staff.  Failure to do so may result in injury.  Recommended time intervals to stop and restart blood-thinners, before & after invasive procedures  Generic Name Brand Name Pre-procedure. Stop this long before procedure. Post-procedure. Minimum waiting period before restarting.  Abciximab Reopro 15 days 2 hrs  Alteplase Activase 10 days 10 days  Anagrelide Agrylin    Apixaban Eliquis 3 days 6 hrs  Cilostazol Pletal 3 days 5 hrs  Clopidogrel Plavix 7-10 days 2 hrs  Dabigatran Pradaxa 5 days 6 hrs  Dalteparin Fragmin 24 hours 4 hrs  Dipyridamole Aggrenox 11days 2 hrs  Edoxaban Lixiana; Savaysa 3 days 2 hrs  Enoxaparin  Lovenox 24 hours 4 hrs  Eptifibatide Integrillin 8 hours 2 hrs  Fondaparinux  Arixtra 72 hours 12 hrs  Hydroxychloroquine Plaquenil 11 days   Prasugrel Effient 7-10 days 6 hrs  Reteplase Retavase 10 days 10 days  Rivaroxaban Xarelto 3 days 6 hrs  Ticagrelor Brilinta 5-7 days 6 hrs  Ticlopidine Ticlid 10-14 days 2 hrs  Tinzaparin Innohep 24 hours 4 hrs  Tirofiban Aggrastat 8 hours 2 hrs  Warfarin Coumadin 5 days 2 hrs   Other medications with blood-thinning effects  Product indications Generic (Brand) names Note  Cholesterol Lipitor Stop 4 days before procedure  Blood thinner (injectable) Heparin (LMW or LMWH Heparin) Stop 24 hours before procedure  Cancer Ibrutinib (Imbruvica) Stop 7 days before procedure  Malaria/Rheumatoid Hydroxychloroquine (Plaquenil) Stop 11 days before procedure  Thrombolytics  10 days before or after procedures   Over-the-counter (OTC) Products with blood-thinning effects  Product Common names Stop Time  Aspirin > 325 mg Goody Powders, Excedrin, etc. 11 days  Aspirin ? 81 mg  7 days  Fish oil  4 days  Garlic supplements  7 days  Ginkgo biloba  36  hours  Ginseng  24 hours  NSAIDs Ibuprofen, Naprosyn, etc. 3 days  Vitamin E  4 days   ____________________________________________________________________________________________    ______________________________________________________________________  Procedure instructions  Do not eat or drink fluids (other than water) for 6 hours before your procedure  No water for 2 hours before your procedure  Take your blood pressure medicine with a sip of water  Arrive 30 minutes before your appointment  Carefully read the "Preparing for your procedure" detailed instructions  If you have questions call us at [phone]  _____________________________________________________________________    ______________________________________________________________________  Preparing for your procedure  Appointments: If you think you may not be able to keep your appointment, call 24-48 hours in advance to cancel. We need time to make it available to others.  During your procedure appointment there will be: No Prescription Refills. No disability issues to discussed. No medication changes or discussions.  Instructions: Food intake: Avoid eating anything solid for at least 8 hours prior to your procedure. Clear liquid intake: You may take clear liquids such as water up to 2 hours prior to your procedure. (No carbonated drinks. No soda.) Transportation: Unless otherwise stated by your physician, bring a driver. Morning Medicines: Except for blood thinners, take all of your other morning medications with a sip of water. Make sure to take your heart and blood pressure medicines. If your blood pressure's lower number is above 100, the case will be rescheduled. Blood thinners: Make sure to stop your blood thinners as instructed.  If you take a blood thinner, but were not instructed to stop it,  call our office [phone] and ask to talk to a nurse. Not stopping a blood thinner prior to certain  procedures could lead to serious complications. Diabetics on insulin: Notify the staff so that you can be scheduled 1st case in the morning. If your diabetes requires high dose insulin, take only  of your normal insulin dose the morning of the procedure and notify the staff that you have done so. Preventing infections: Shower with an antibacterial soap the morning of your procedure.  Build-up your immune system: Take 1000 mg of Vitamin C with every meal (3 times a day) the day prior to your procedure. Antibiotics: Inform the nursing staff if you are taking any antibiotics or if you have any conditions that may require antibiotics prior to procedures. (Example: recent joint implants)   Pregnancy: If you are pregnant make sure to notify the nursing staff. Not doing so may result in injury to the fetus, including death.  Sickness: If you have a cold, fever, or any active infections, call and cancel or reschedule your procedure. Receiving steroids while having an infection may result in complications. Arrival: You must be in the facility at least 30 minutes prior to your scheduled procedure. Tardiness: Your scheduled time is also the cutoff time. If you do not arrive at least 15 minutes prior to your procedure, you will be rescheduled.  Children: Do not bring any children with you. Make arrangements to keep them home. Dress appropriately: There is always a possibility that your clothing may get soiled. Avoid long dresses. Valuables: Do not bring any jewelry or valuables.  Reasons to call and reschedule or cancel your procedure: (Following these recommendations will minimize the risk of a serious complication.) Surgeries: Avoid having procedures within 2 weeks of any surgery. (Avoid for 2 weeks before or after any surgery). Flu Shots: Avoid having procedures within 2 weeks of a flu shots or . (Avoid for 2 weeks before or after immunizations). Barium: Avoid having a procedure within 7-10 days after having  had a radiological study involving the use of radiological contrast. (Myelograms, Barium swallow or enema study). Heart attacks: Avoid any elective procedures or surgeries for the initial 6 months after a "Myocardial Infarction" (Heart Attack). Blood thinners: It is imperative that you stop these medications before procedures. Let us know if you if you take any blood thinner.  Infection: Avoid procedures during or within two weeks of an infection (including chest colds or gastrointestinal problems). Symptoms associated with infections include: Localized redness, fever, chills, night sweats or profuse sweating, burning sensation when voiding, cough, congestion, stuffiness, runny nose, sore throat, diarrhea, nausea, vomiting, cold or Flu symptoms, recent or current infections. It is specially important if the infection is over the area that we intend to treat. Heart and lung problems: Symptoms that may suggest an active cardiopulmonary problem include: cough, chest pain, breathing difficulties or shortness of breath, dizziness, ankle swelling, uncontrolled high or unusually low blood pressure, and/or palpitations. If you are experiencing any of these symptoms, cancel your procedure and contact your primary care physician for an evaluation.  Remember:  Regular Business hours are:  Monday to Thursday 8:00 AM to 4:00 PM  Provider's Schedule: Milinda Pointer, MD:  Procedure days: Tuesday and Thursday 7:30 AM to 4:00 PM  Gillis Santa, MD:  Procedure days: Monday and Wednesday 7:30 AM to 4:00 PM  ______________________________________________________________________    ____________________________________________________________________________________________  General Risks and Possible Complications  Patient Responsibilities: It is important that you read this as it  is part of your informed consent. It is our duty to inform you of the risks and possible complications associated with treatments  offered to you. It is your responsibility as a patient to read this and to ask questions about anything that is not clear or that you believe was not covered in this document.  Patient's Rights: You have the right to refuse treatment. You also have the right to change your mind, even after initially having agreed to have the treatment done. However, under this last option, if you wait until the last second to change your mind, you may be charged for the materials used up to that point.  Introduction: Medicine is not an Visual merchandiser. Everything in Medicine, including the lack of treatment(s), carries the potential for danger, harm, or loss (which is by definition: Risk). In Medicine, a complication is a secondary problem, condition, or disease that can aggravate an already existing one. All treatments carry the risk of possible complications. The fact that a side effects or complications occurs, does not imply that the treatment was conducted incorrectly. It must be clearly understood that these can happen even when everything is done following the highest safety standards.  No treatment: You can choose not to proceed with the proposed treatment alternative. The "PRO(s)" would include: avoiding the risk of complications associated with the therapy. The "CON(s)" would include: not getting any of the treatment benefits. These benefits fall under one of three categories: diagnostic; therapeutic; and/or palliative. Diagnostic benefits include: getting information which can ultimately lead to improvement of the disease or symptom(s). Therapeutic benefits are those associated with the successful treatment of the disease. Finally, palliative benefits are those related to the decrease of the primary symptoms, without necessarily curing the condition (example: decreasing the pain from a flare-up of a chronic condition, such as incurable terminal cancer).  General Risks and Complications: These are associated to most  interventional treatments. They can occur alone, or in combination. They fall under one of the following six (6) categories: no benefit or worsening of symptoms; bleeding; infection; nerve damage; allergic reactions; and/or death. No benefits or worsening of symptoms: In Medicine there are no guarantees, only probabilities. No healthcare provider can ever guarantee that a medical treatment will work, they can only state the probability that it may. Furthermore, there is always the possibility that the condition may worsen, either directly, or indirectly, as a consequence of the treatment. Bleeding: This is more common if the patient is taking a blood thinner, either prescription or over the counter (example: Goody Powders, Fish oil, Aspirin, Garlic, etc.), or if suffering a condition associated with impaired coagulation (example: Hemophilia, cirrhosis of the liver, low platelet counts, etc.). However, even if you do not have one on these, it can still happen. If you have any of these conditions, or take one of these drugs, make sure to notify your treating physician. Infection: This is more common in patients with a compromised immune system, either due to disease (example: diabetes, cancer, human immunodeficiency virus [HIV], etc.), or due to medications or treatments (example: therapies used to treat cancer and rheumatological diseases). However, even if you do not have one on these, it can still happen. If you have any of these conditions, or take one of these drugs, make sure to notify your treating physician. Nerve Damage: This is more common when the treatment is an invasive one, but it can also happen with the use of medications, such as those used in the treatment  of cancer. The damage can occur to small secondary nerves, or to large primary ones, such as those in the spinal cord and brain. This damage may be temporary or permanent and it may lead to impairments that can range from temporary numbness to  permanent paralysis and/or brain death. Allergic Reactions: Any time a substance or material comes in contact with our body, there is the possibility of an allergic reaction. These can range from a mild skin rash (contact dermatitis) to a severe systemic reaction (anaphylactic reaction), which can result in death. Death: In general, any medical intervention can result in death, most of the time due to an unforeseen complication. ____________________________________________________________________________________________    _________________________________________________________________________________________  Body mass index (BMI) Weight Management Required  URGENT: Dear Ms. Younan your weight has been found to be adversely affecting your health. Aggressive action is immediately required. Talk to your primary care physician.  Body mass index (BMI) is a common tool for deciding whether a person has an appropriate body weight.  It measures a persons weight in relation to their height.   According to the [facility] of health (NIH): A BMI of less than 18.5 means that a person is underweight. A BMI of between 18.5 and 24.9 is ideal. A BMI of between 25 and 29.9 is overweight. A BMI over 30 indicates obesity.  Body Mass Index (BMI) Classification BMI level (kg/m2) Category Associated incidence of chronic pain  <18  Underweight   18.5-24.9 Ideal body weight   25-29.9 Overweight  20%  30-34.9 Obese (Class I)  68%  35-39.9 Severe obesity (Class II)  136%  >40 Extreme obesity (Class III)  254%   Your current Estimated body mass index is 35.61 kg/m as calculated from the following:   Height as of 10/12/22: 5\' 3"  (1.6 m).   Weight as of 10/12/22: 201 lb (91.2 kg).  Morbidly Obese Classification: You will be considered to be "Morbidly Obese" if your BMI is above 30 and you have one or more of the following conditions caused or associated to obesity: 1.    Type 2 Diabetes (Leading to  cardiovascular diseases (CVD), stroke, peripheral vascular diseases (PVD), retinopathy, nephropathy, and neuropathy) 2.    Cardiovascular Disease (High Blood Pressure; Congestive Heart Failure; High Cholesterol; Coronary Artery Disease; Angina; Arrhythmias, Dysrhythmias, or Heart Attacks) 3.    Breathing problems (Asthma; obesity-hypoventilation syndrome; obstructive sleep apnea; chronic inflammatory airway disease; reactive airway disease; or shortness of breath) 4.    Chronic kidney disease 5.    Liver disease (nonalcoholic fatty liver disease) 6.    High blood pressure 7.    Acid reflux (gastroesophageal reflux disease; heartburn) 8.    Osteoarthritis (OA) (affecting the hip(s), the knee(s) and/or the lower back) 9.    Low back pain (Lumbar Facet Syndrome; and/or Degenerative Disc Disease) 10.  Hip pain (Osteoarthritis of hip) (For every 1 lbs of added body weight, there is a 2 lbs increase in pressure inside of each hip articulation. 1:2 mechanical relationship) 11.  Knee pain (Osteoarthritis of knee) (For every 1 lbs of added body weight, there is a 4 lbs increase in pressure inside of each knee articulation. 1:4 mechanical relationship) (patients with a BMI>30 kg/m2 were 6.8 times more likely to develop knee OA than normal-weight individuals) 12.  Cancer: Epidemiological studies have shown that obesity is a risk factor for: post-menopausal breast cancer; cancers of the endometrium, colon and kidney cancer; malignant adenomas of the oesophagus. Obese subjects have an approximately 1.5-3.5-fold increased risk  of developing these cancers compared with normal-weight subjects, and it has been estimated that between 15 and 45% of these cancers can be attributed to overweight. More recent studies suggest that obesity may also increase the risk of other types of cancer, including pancreatic, hepatic and gallbladder cancer. (Ref: Obesity and cancer. Pischon T, Nthlings U, [employer] H. Proc Nutr Soc. 2008  May;67(2):128-45. doi: 10.1017/S0029665108006976.) The International Agency for Research on Cancer (IARC) has identified 13 cancers associated with overweight and obesity: meningioma, multiple myeloma, adenocarcinoma of the esophagus, and cancers of the thyroid, postmenopausal breast cancer, gallbladder, stomach, liver, pancreas, kidney, ovaries, uterus, colon and rectal (colorectal) cancers. 55 percent of all cancers diagnosed in women and 24 percent of those diagnosed in men are associated with overweight and obesity.  Recommendation: At this point it is urgent that you take a step back and concentrate in loosing weight. Dedicate 100% of your efforts on this task. Nothing else will improve your health more than bringing your weight down and your BMI to less than 30. If you are here, you probably have chronic pain. We know that most chronic pain patients have difficulty exercising secondary to their pain. For this reason, you must rely on proper nutrition and diet in order to lose the weight. If your BMI is above 40, you should seriously consider bariatric surgery. A realistic goal is to lose 10% of your body weight over a period of 12 months.  Be honest to yourself, if over time you have unsuccessfully tried to lose weight, then it is time for you to seek professional help and to enter a medically supervised weight management program, and/or undergo bariatric surgery. Stop procrastinating.   Pain management considerations:  1.    Pharmacological Problems: Be advised that the use of opioid analgesics (oxycodone; hydrocodone; morphine; methadone; codeine; and all of their derivatives) have been associated with decreased metabolism and weight gain.  For this reason, should we see that you are unable to lose weight while taking these medications, it may become necessary for [place] to taper down and indefinitely discontinue them.  2.    Technical Problems: The incidence of successful interventional therapies  decreases as the patient's BMI increases. It is much more difficult to accomplish a safe and effective interventional therapy on a patient with a BMI above 35. 3.    Radiation Exposure Problems: The x-rays machine, used to accomplish injection therapies, will automatically increase their x-ray output in order to capture an appropriate bone image. This means that radiation exposure increases exponentially with the patient's BMI. (The higher the BMI, the higher the radiation exposure.) Although the level of radiation used at a given time is still safe to the patient, it is not for the physician and/or assisting staff. Unfortunately, radiation exposure is accumulative. Because physicians and the staff have to do procedures and be exposed on a daily basis, this can result in health problems such as cancer and radiation burns. Radiation exposure to the staff is monitored by the radiation batches that they wear. The exposure levels are reported back to the staff on a quarterly basis. Depending on levels of exposure, physicians and staff may be obligated by law to decrease this exposure. This means that they have the right and obligation to refuse providing therapies where they may be overexposed to radiation. For this reason, physicians may decline to offer therapies such as radiofrequency ablation or implants to patients with a BMI above 40. 4.    Current Trends: Be advised that  the current trend is to no longer offer certain therapies to patients with a BMI equal to, or above 35, due to increase perioperative risks, increased technical procedural difficulties, and excessive radiation exposure to healthcare personnel.  _________________________________________________________________________________________

## 2022-11-08 ENCOUNTER — Telehealth: Payer: Self-pay | Admitting: Pain Medicine

## 2022-11-08 NOTE — Telephone Encounter (Signed)
PT called stated that she will like to have sedation on tomorrow when she comes in for procedure.

## 2022-11-08 NOTE — Progress Notes (Unsigned)
PROVIDER NOTE: Interpretation of information contained herein should be left to medically-trained personnel. Specific patient instructions are provided elsewhere under "Patient Instructions" section of medical record. This document was created in part using STT-dictation technology, any transcriptional errors that may result from this process are unintentional.  Patient: Kara [name] Type: Established DOB: March 04, 1951 MRN: [id number] PCP: Barbette Reichmann, MD  Service: Procedure DOS: 11/09/2022 Setting: Ambulatory Location: Ambulatory outpatient facility Delivery: Face-to-face Provider: Oswaldo Done, MD Specialty: Interventional Pain Management Specialty designation: 09 Location: Outpatient facility Ref. Prov.: Barbette Reichmann, MD       Interventional Therapy   Primary Reason for Visit: Interventional Pain Management Treatment. CC: No chief complaint on file.    Procedure:          Anesthesia, Analgesia, Anxiolysis:  Type: Piriformis Trigger Point Injection (1-2 muscle groups) #1  CPT: 20552 Primary Purpose: Diagnostic Region:             Level: Sacral Target Area: Piriformis muscle Trigger Point Approach: Percutaneous, ipsilateral approach. Laterality: Right        Type: Local Anesthesia Local Anesthetic: Lidocaine 1-2% Sedation: None  Indication(s):  Analgesia Route: Infiltration ([place]/IM) IV Access: N/A   Position: Prone   No diagnosis found. NAS-11 Pain score:   Pre-procedure:  /10   Post-procedure:  /10     Pre-op H&P Assessment:  Kara [name] is a 72 y.o. (year old), female patient, seen today for interventional treatment. She  has a past surgical history that includes Knee surgery; Carpal tunnel release (Bilateral); Colon resection sigmoid (N/A, 11/02/2016); Colostomy (N/A, 11/02/2016); Sigmoidoscopy (N/A, 11/13/2016); PULMONARY VENOGRAPHY (N/A, 11/19/2016); Colonoscopy with propofol (N/A, 02/03/2017); Colon surgery (11/02/2016); IVC FILTER INSERTION  (Right, 10/2016); Colostomy reversal (N/A, 02/24/2017); Appendectomy (N/A, 02/24/2017); Lysis of adhesion (N/A, 02/24/2017); laparoscopy (N/A, 02/24/2017); [place] loop diversion (N/A, 02/24/2017); Sigmoidoscopy (N/A, 05/19/2017); Ileostomy closure (N/A, 06/01/2017); IVC FILTER REMOVAL (N/A, 08/09/2017); Sacroplasty (N/A, 11/08/2017); Cataract extraction w/PHACO (Left, 11/21/2018); Cataract extraction w/PHACO (Right, 12/12/2018); and Video bronchoscopy with endobronchial navigation (N/A, 02/23/2021). Ms. Lyng has a current medication list which includes the following prescription(s): alendronate, calcium carbonate, candesartan, cholecalciferol, vitamin b-12, cyclobenzaprine, dulaglutide, gabapentin, gabapentin, glipizide, hydrocodone-acetaminophen, magnesium oxide -mg supplement, meclizine, oxybutynin, ropinirole, and warfarin. Her primarily concern today is the No chief complaint on file.  Initial Vital Signs:  Pulse/HCG Rate:    Temp:   Resp:   BP:   SpO2:    BMI: Estimated body mass index is 35.61 kg/m as calculated from the following:   Height as of 11/01/22: 5\' 3"  (1.6 m).   Weight as of 11/01/22: 201 lb (91.2 kg).  Risk Assessment: Allergies: Reviewed. She is allergic to adhesive [tape], other, latex, and morphine and codeine.  Allergy Precautions: None required Coagulopathies: Reviewed. None identified.  Blood-thinner therapy: None at this time Active Infection(s): Reviewed. None identified. Ms. Bebb is afebrile  Site Confirmation: Ms. Trichel was asked to confirm the procedure and laterality before marking the site Procedure checklist: Completed Consent: Before the procedure and under the influence of no sedative(s), amnesic(s), or anxiolytics, the patient was informed of the treatment options, risks and possible complications. To fulfill our ethical and legal obligations, as recommended by the American Medical Association's Code of Ethics, I have informed the patient of my clinical  impression; the nature and purpose of the treatment or procedure; the risks, benefits, and possible complications of the intervention; the alternatives, including doing nothing; the risk(s) and benefit(s) of the alternative treatment(s) or procedure(s); and the risk(s) and benefit(s) of  doing nothing. The patient was provided information about the general risks and possible complications associated with the procedure. These may include, but are not limited to: failure to achieve desired goals, infection, bleeding, organ or nerve damage, allergic reactions, paralysis, and death. In addition, the patient was informed of those risks and complications associated to the procedure, such as failure to decrease pain; infection; bleeding; organ or nerve damage with subsequent damage to sensory, motor, and/or autonomic systems, resulting in permanent pain, numbness, and/or weakness of one or several areas of the body; allergic reactions; (i.e.: anaphylactic reaction); and/or death. Furthermore, the patient was informed of those risks and complications associated with the medications. These include, but are not limited to: allergic reactions (i.e.: anaphylactic or anaphylactoid reaction(s)); adrenal axis suppression; blood sugar elevation that in diabetics may result in ketoacidosis or comma; water retention that in patients with history of congestive heart failure may result in shortness of breath, pulmonary edema, and decompensation with resultant heart failure; weight gain; swelling or edema; medication-induced neural toxicity; particulate matter embolism and blood vessel occlusion with resultant organ, and/or nervous system infarction; and/or aseptic necrosis of one or more joints. Finally, the patient was informed that Medicine is not an exact science; therefore, there is also the possibility of unforeseen or unpredictable risks and/or possible complications that may result in a catastrophic outcome. The patient  indicated having understood very clearly. We have given the patient no guarantees and we have made no promises. Enough time was given to the patient to ask questions, all of which were answered to the patient's satisfaction. Ms. Angrisani has indicated that she wanted to continue with the procedure. Attestation: I, the ordering provider, attest that I have discussed with the patient the benefits, risks, side-effects, alternatives, likelihood of achieving goals, and potential problems during recovery for the procedure that I have provided informed consent. Date  Time: {CHL [name]-PAIN TIME CHOICES:[id number]}  Pre-Procedure Preparation:  Monitoring: As per clinic protocol. Respiration, ETCO2, SpO2, BP, heart rate and rhythm monitor placed and checked for adequate function Safety Precautions: Patient was assessed for positional comfort and pressure points before starting the procedure. Time-out: I initiated and conducted the "Time-out" before starting the procedure, as per protocol. The patient was asked to participate by confirming the accuracy of the "Time Out" information. Verification of the correct person, site, and procedure were performed and confirmed by me, the nursing staff, and the patient. "Time-out" conducted as per Joint Commission's Universal Protocol (UP.01.01.01). Time:   Start Time:   hrs.  Description of Procedure:          Area Prepped: Entire             Region DuraPrep (Iodine Povacrylex [0.7% available iodine] and Isopropyl Alcohol, 74% w/w) Safety Precautions: Aspiration looking for blood return was conducted prior to all injections. At no point did we inject any substances, as a needle was being advanced. No attempts were made at seeking any paresthesias. Safe injection practices and needle disposal techniques used. Medications properly checked for expiration dates. SDV (single dose vial) medications used. Description of the Procedure: Protocol guidelines were followed. The patient  was placed in position over the fluoroscopy table. The target area was identified and the area prepped in the usual manner. Skin & deeper tissues infiltrated with local anesthetic. Appropriate amount of time allowed to pass for local anesthetics to take effect. The procedure needles were then advanced to the target area. Proper needle placement secured. Negative aspiration confirmed. Solution injected in intermittent  fashion, asking for systemic symptoms every 0.5cc of injectate. The needles were then removed and the area cleansed, making sure to leave some of the prepping solution back to take advantage of its long term bactericidal properties.  There were no vitals filed for this visit.  Start Time:   hrs. End Time:   hrs. Materials:  Needle(s) Type:    ***    Gauge:    ***    Length:    ***    Medication(s): Please see orders for medications and dosing details.  Imaging Guidance:          Type of Imaging Technique: None used Indication(s): N/A Exposure Time: No patient exposure Contrast: None used. Fluoroscopic Guidance: N/A Ultrasound Guidance: N/A Interpretation: N/A  Antibiotic Prophylaxis:   Anti-infectives (From admission, onward)    None      Indication(s): None identified  Post-operative Assessment:  Post-procedure Vital Signs:  Pulse/HCG Rate:    Temp:   Resp:   BP:   SpO2:    EBL: None  Complications: No immediate post-treatment complications observed by team, or reported by patient.  Note: The patient tolerated the entire procedure well. A repeat set of vitals were taken after the procedure and the patient was kept under observation following institutional policy, for this type of procedure. Post-procedural neurological assessment was performed, showing return to baseline, prior to discharge. The patient was provided with post-procedure discharge instructions, including a section on how to identify potential problems. Should any problems arise concerning this  procedure, the patient was given instructions to immediately contact us, at any time, without hesitation. In any case, we plan to contact the patient by telephone for a follow-up status report regarding this interventional procedure.  Comments:  No additional relevant information.  Plan of Care (POC)  Orders:  No orders of the defined types were placed in this encounter.  Chronic Opioid Analgesic:  No chronic opioid analgesics therapy prescribed by our practice, 2ry to Medication Agreement Violation. Oxycodone IR 10 mg, 1 tab PO qd,  from PCP.  (Getting oxycodone from PCP in multiple occasions, according to PMP)(11/21/20; 12/26/20; 01/16/21; 02/13/21). Opioid analgesic pharmacotherapy D/C'ed on 03/03/2021. MME: 20 mg/day.   Medications ordered for procedure: No orders of the defined types were placed in this encounter.  Medications administered: Charissa Bash had no medications administered during this visit.  See the medical record for exact dosing, route, and time of administration.  Follow-up plan:   No follow-ups on file.       Interventional Therapies  Risk  Complexity Considerations:   ANTICOAGULATION: Coumadin (Stop: 5 days  Re-start: 2hrs)  Hx DVT & PE ALLERGY: Latex Morbid obesity (class III) (BMI>40)  metabolic syndrome  NIDDM  HTN Difficult Lumbar RFA due to morbid obesity    Planned  Pending: Diagnostic/therapeutic right piriformis muscle injection #1 (no steroids)    Under consideration:   Therapeutic bilateral lumbar facet RFA #2 (patient indicates preferring to have only the block without radiofrequency.)   Completed:   Therapeutic bilateral lumbar facet MBB (L3-S1) x1 (04/06/2022) (80/80/90/90)  Diagnostic/therapeutic right IA hip inj. x2 (01/28/2022) (100/100/85/85)  Diagnostic/therapeutic left IA hip inj. x1 (01/28/2022) (100/100/85/85)  Diagnostic/therapeutic right trochanteric bursa inj. x1 (09/15/2021) (85/85/85)  Diagnostic/therapeutic left trochanteric  bursa inj. x1 (01/28/2022) (100/100/85/85)  Therapeutic left lumbar facet RFA x1 (07/21/2021) (100/100/100 x1 week/80) (Lasted 9 months)  Therapeutic right lumbar facet RFA x1 (07/07/2021) (100/100/80/80) (Lasted 9 months)  Palliative/Therapeutic left L4-5 LESI x1 (11/10/2017) (100/80/0/0-25)  Diagnostic/therapeutic  right L4 TFESI x1 (11/19/2021) (90/80/0/0) (fell at [employer]) Palliative left lumbar facet (L2-S1) MBB x19 (10/12/2022) (100/100/0/100/100)  Palliative right lumbar facet (L2-S1) MBB x26 (10/12/2022) (100/100/0/100/100)  Palliative right SI joint block x9 (01/28/2022) (100/100/80/80)  Diagnostic left SI joint block x2 (01/28/2022) (100/100/80/80)  Palliative/Therapeutic (Midline) caudal ESI x3 (06/08/2018) (100/100/100 x 2-3 days/0)    Therapeutic  Palliative (PRN) options:   NO PRN procedures without F2F exam by Dr. Laban Emperor due to multifactorial etiology of chronic pain. No more procedures until after 08/14/2022.    Pharmacotherapy  Nonopioids transferred 03/27/2020: Calcium        Recent Visits Date Type Provider Dept  11/01/22 Office Visit Delano Metz, MD [name]  10/12/22 Procedure visit Edward Jolly, MD [name]  09/29/22 Office Visit Delano Metz, MD [name]  09/02/22 Office Visit Delano Metz, MD [name]  08/17/22 Procedure visit Delano Metz, MD [name]  Showing recent visits within past 90 days and meeting all other requirements Future Appointments Date Type Provider Dept  11/09/22 Appointment Delano Metz, MD [name]  Showing future appointments within next 90 days and meeting all other requirements  Disposition: Discharge home  Discharge (Date  Time): 11/09/2022;   hrs.   Primary Care Physician: Barbette Reichmann, MD Location: [facility] Note by: Oswaldo Done, MD (TTS technology used. I apologize for any  typographical errors that were not detected and corrected.) Date: 11/09/2022; Time: 7:38 AM  Disclaimer:  Medicine is not an Visual merchandiser. The only guarantee in medicine is that nothing is guaranteed. It is important to note that the decision to proceed with this intervention was based on the information collected from the patient. The Data and conclusions were drawn from the patient's questionnaire, the interview, and the physical examination. Because the information was provided in large part by the patient, it cannot be guaranteed that it has not been purposely or unconsciously manipulated. Every effort has been made to obtain as much relevant data as possible for this evaluation. It is important to note that the conclusions that lead to this procedure are derived in large part from the available data. Always take into account that the treatment will also be dependent on availability of resources and existing treatment guidelines, considered by other Pain Management Practitioners as being common knowledge and practice, at the time of the intervention. For Medico-Legal purposes, it is also important to point out that variation in procedural techniques and pharmacological choices are the acceptable norm. The indications, contraindications, technique, and results of the above procedure should only be interpreted and judged by a Board-Certified Interventional Pain Specialist with extensive familiarity and expertise in the same exact procedure and technique.

## 2022-11-08 NOTE — Telephone Encounter (Signed)
Explained to patient that we do not give sedation for Trigger point injections.

## 2022-11-09 ENCOUNTER — Ambulatory Visit
Admission: RE | Admit: 2022-11-09 | Discharge: 2022-11-09 | Disposition: A | Discharge status: Home / Self Care | Payer: PPO | Source: Ambulatory Visit | Attending: Pain Medicine | Admitting: Pain Medicine

## 2022-11-09 ENCOUNTER — Encounter: Payer: Self-pay | Admitting: Pain Medicine

## 2022-11-09 ENCOUNTER — Ambulatory Visit: Payer: PPO | Attending: Pain Medicine | Admitting: Pain Medicine

## 2022-11-09 VITALS — BP 168/82 | HR 74 | Temp 97.2°F | Resp 15 | Ht 63.0 in | Wt 203.0 lb

## 2022-11-09 DIAGNOSIS — M533 Sacrococcygeal disorders, not elsewhere classified: Secondary | ICD-10-CM | POA: Insufficient documentation

## 2022-11-09 DIAGNOSIS — G8929 Other chronic pain: Secondary | ICD-10-CM | POA: Diagnosis not present

## 2022-11-09 DIAGNOSIS — M1611 Unilateral primary osteoarthritis, right hip: Secondary | ICD-10-CM | POA: Diagnosis not present

## 2022-11-09 DIAGNOSIS — Z5189 Encounter for other specified aftercare: Secondary | ICD-10-CM

## 2022-11-09 DIAGNOSIS — Z7901 Long term (current) use of anticoagulants: Secondary | ICD-10-CM

## 2022-11-09 DIAGNOSIS — Z91040 Latex allergy status: Secondary | ICD-10-CM | POA: Diagnosis not present

## 2022-11-09 DIAGNOSIS — M25551 Pain in right hip: Secondary | ICD-10-CM | POA: Diagnosis not present

## 2022-11-09 DIAGNOSIS — M7918 Myalgia, other site: Secondary | ICD-10-CM

## 2022-11-09 MED ORDER — ROPIVACAINE HCL 2 MG/ML IJ SOLN
9.0000 mL | Freq: Once | INTRAMUSCULAR | Status: AC
  Administered 2022-11-09: 9 mL
  Filled 2022-11-09: qty 20

## 2022-11-09 MED ORDER — METHOCARBAMOL 1000 MG/10ML IJ SOLN
200.0000 mg | Freq: Once | INTRAMUSCULAR | Status: AC
  Administered 2022-11-09: 200 mg via INTRAMUSCULAR
  Filled 2022-11-09: qty 10

## 2022-11-09 MED ORDER — LIDOCAINE HCL 2 % IJ SOLN
20.0000 mL | Freq: Once | INTRAMUSCULAR | Status: AC
  Administered 2022-11-09: 200 mg
  Filled 2022-11-09: qty 20

## 2022-11-09 MED ORDER — KETOROLAC TROMETHAMINE 60 MG/2ML IM SOLN
60.0000 mg | Freq: Once | INTRAMUSCULAR | Status: AC
  Administered 2022-11-09: 60 mg via INTRAMUSCULAR
  Filled 2022-11-09: qty 2

## 2022-11-09 MED ORDER — PENTAFLUOROPROP-TETRAFLUOROETH EX AERO
INHALATION_SPRAY | Freq: Once | CUTANEOUS | Status: DC
  Filled 2022-11-09: qty 116

## 2022-11-09 NOTE — Patient Instructions (Signed)
____________________________________________________________________________________________  Post-Procedure Discharge Instructions  Instructions: Apply ice:  Purpose: This will minimize any swelling and discomfort after procedure.  When: Day of procedure, as soon as you get home. How: Fill a plastic sandwich bag with crushed ice. Cover it with a small towel and apply to injection site. How long: (15 min on, 15 min off) Apply for 15 minutes then remove x 15 minutes.  Repeat sequence on day of procedure, until you go to bed. Apply heat:  Purpose: To treat any soreness and discomfort from the procedure. When: Starting the next day after the procedure. How: Apply heat to procedure site starting the day following the procedure. How long: May continue to repeat daily, until discomfort goes away. Food intake: Start with clear liquids (like water) and advance to regular food, as tolerated.  Physical activities: Keep activities to a minimum for the first 8 hours after the procedure. After that, then as tolerated. Driving: If you have received any sedation, be responsible and do not drive. You are not allowed to drive for 24 hours after having sedation. Blood thinner: (Applies only to those taking blood thinners) You may restart your blood thinner 6 hours after your procedure. Insulin: (Applies only to Diabetic patients taking insulin) As soon as you can eat, you may resume your normal dosing schedule. Infection prevention: Keep procedure site clean and dry. Shower daily and clean area with soap and water. Post-procedure Pain Diary: Extremely important that this be done correctly and accurately. Recorded information will be used to determine the next step in treatment. For the purpose of accuracy, follow these rules: Evaluate only the area treated. Do not report or include pain from an untreated area. For the purpose of this evaluation, ignore all other areas of pain, except for the treated  area. After your procedure, avoid taking a long nap and attempting to complete the pain diary after you wake up. Instead, set your alarm clock to go off every hour, on the hour, for the initial 8 hours after the procedure. Document the duration of the numbing medicine, and the relief you are getting from it. Do not go to sleep and attempt to complete it later. It will not be accurate. If you received sedation, it is likely that you were given a medication that may cause amnesia. Because of this, completing the diary at a later time may cause the information to be inaccurate. This information is needed to plan your care. Follow-up appointment: Keep your post-procedure follow-up evaluation appointment after the procedure (usually 2 weeks for most procedures, 6 weeks for radiofrequencies). DO NOT FORGET to bring you pain diary with you.   Expect: (What should I expect to see with my procedure?) From numbing medicine (AKA: Local Anesthetics): Numbness or decrease in pain. You may also experience some weakness, which if present, could last for the duration of the local anesthetic. Onset: Full effect within 15 minutes of injected. Duration: It will depend on the type of local anesthetic used. On the average, 1 to 8 hours.  From steroids (None used) From procedure: Some discomfort is to be expected once the numbing medicine wears off. This should be minimal if ice and heat are applied as instructed.  Call if: (When should I call?) You experience numbness and weakness that gets worse with time, as opposed to wearing off. New onset bowel or bladder incontinence. (Applies only to procedures done in the spine)  Emergency Numbers: Durning business hours (Monday - Thursday, 8:00 AM - 4:00 PM) (  Friday, 9:00 AM - 12:00 Noon): [phone] After hours: [phone] NOTE: If you are having a problem and are unable connect with, or to talk to a provider, then go to your nearest urgent care or emergency  department. If the problem is serious and urgent, please call 911. ____________________________________________________________________________________________

## 2022-11-10 ENCOUNTER — Telehealth: Payer: Self-pay

## 2022-11-10 NOTE — Telephone Encounter (Signed)
Post procedure follow up.  LM 

## 2022-11-16 ENCOUNTER — Telehealth: Payer: Self-pay | Admitting: Pain Medicine

## 2022-11-16 NOTE — Telephone Encounter (Signed)
States pain some worse than it was before the injection. I offered to ask Dr. Laban Emperor if she may receive a Toradol injection. She does not want this. I explained that no interventions will be done for pain at only 1 week after injection.

## 2022-11-16 NOTE — Telephone Encounter (Signed)
PT called stated that she has been having pain since she got the injection done last Tuesday. PT states that she hasn't had any relief at all. Please give patient a call. TY

## 2022-11-24 DIAGNOSIS — M5136 Other intervertebral disc degeneration, lumbar region: Secondary | ICD-10-CM | POA: Diagnosis not present

## 2022-11-24 DIAGNOSIS — R682 Dry mouth, unspecified: Secondary | ICD-10-CM | POA: Diagnosis not present

## 2022-11-24 DIAGNOSIS — G8929 Other chronic pain: Secondary | ICD-10-CM | POA: Diagnosis not present

## 2022-11-24 DIAGNOSIS — N3941 Urge incontinence: Secondary | ICD-10-CM | POA: Diagnosis not present

## 2022-11-24 DIAGNOSIS — R42 Dizziness and giddiness: Secondary | ICD-10-CM | POA: Diagnosis not present

## 2022-11-24 DIAGNOSIS — E1165 Type 2 diabetes mellitus with hyperglycemia: Secondary | ICD-10-CM | POA: Diagnosis not present

## 2022-11-24 DIAGNOSIS — Z86711 Personal history of pulmonary embolism: Secondary | ICD-10-CM | POA: Diagnosis not present

## 2022-11-24 DIAGNOSIS — I10 Essential (primary) hypertension: Secondary | ICD-10-CM | POA: Diagnosis not present

## 2022-11-24 DIAGNOSIS — Z7901 Long term (current) use of anticoagulants: Secondary | ICD-10-CM | POA: Diagnosis not present

## 2022-11-24 DIAGNOSIS — N390 Urinary tract infection, site not specified: Secondary | ICD-10-CM | POA: Diagnosis not present

## 2022-11-29 ENCOUNTER — Telehealth: Payer: Self-pay | Admitting: *Deleted

## 2022-11-29 ENCOUNTER — Telehealth: Payer: Self-pay | Admitting: Pain Medicine

## 2022-11-29 NOTE — Telephone Encounter (Signed)
PT called states that she is having a lot of back pain , wants to see what Laban Emperor can do. Please give patient a call. TY

## 2022-11-29 NOTE — Telephone Encounter (Signed)
Left voicemail that patient does have a prn facet block ordered.  However if her pain is in her hips and legs, we may need to do something different.  If wanting the facet block, just call and schedule.

## 2022-11-29 NOTE — Telephone Encounter (Signed)
Patient returned my call and is experiencing pain in her lower back mainly in the evenings.  She reports that during the day the pain is okay but at night she has to go to bed and lay down in order to get some relief.  BLF are ordered prn and this is what she said she feels will help.  Transferred back to scheduling to get on the books with FN for procedure.

## 2022-12-01 DIAGNOSIS — Z1231 Encounter for screening mammogram for malignant neoplasm of breast: Secondary | ICD-10-CM | POA: Diagnosis not present

## 2022-12-01 DIAGNOSIS — I10 Essential (primary) hypertension: Secondary | ICD-10-CM | POA: Diagnosis not present

## 2022-12-01 DIAGNOSIS — M25511 Pain in right shoulder: Secondary | ICD-10-CM | POA: Diagnosis not present

## 2022-12-01 DIAGNOSIS — E1165 Type 2 diabetes mellitus with hyperglycemia: Secondary | ICD-10-CM | POA: Diagnosis not present

## 2022-12-01 DIAGNOSIS — G8929 Other chronic pain: Secondary | ICD-10-CM | POA: Diagnosis not present

## 2022-12-01 DIAGNOSIS — M1611 Unilateral primary osteoarthritis, right hip: Secondary | ICD-10-CM | POA: Diagnosis not present

## 2022-12-02 ENCOUNTER — Other Ambulatory Visit: Payer: Self-pay

## 2022-12-02 ENCOUNTER — Ambulatory Visit: Payer: PPO | Admitting: Pain Medicine

## 2022-12-02 ENCOUNTER — Other Ambulatory Visit: Payer: Self-pay | Admitting: Internal Medicine

## 2022-12-02 ENCOUNTER — Emergency Department
Admission: EM | Admit: 2022-12-02 | Discharge: 2022-12-02 | Disposition: A | Discharge status: Home / Self Care | Payer: PPO | Source: Home / Self Care | Attending: Emergency Medicine | Admitting: Emergency Medicine

## 2022-12-02 DIAGNOSIS — Z8719 Personal history of other diseases of the digestive system: Secondary | ICD-10-CM

## 2022-12-02 DIAGNOSIS — R112 Nausea with vomiting, unspecified: Secondary | ICD-10-CM | POA: Insufficient documentation

## 2022-12-02 DIAGNOSIS — R1033 Periumbilical pain: Secondary | ICD-10-CM | POA: Insufficient documentation

## 2022-12-02 DIAGNOSIS — K429 Umbilical hernia without obstruction or gangrene: Secondary | ICD-10-CM | POA: Insufficient documentation

## 2022-12-02 DIAGNOSIS — Z1231 Encounter for screening mammogram for malignant neoplasm of breast: Secondary | ICD-10-CM

## 2022-12-02 DIAGNOSIS — R1084 Generalized abdominal pain: Secondary | ICD-10-CM | POA: Diagnosis not present

## 2022-12-02 DIAGNOSIS — I10 Essential (primary) hypertension: Secondary | ICD-10-CM | POA: Diagnosis not present

## 2022-12-02 DIAGNOSIS — Z91199 Patient's noncompliance with other medical treatment and regimen due to unspecified reason: Secondary | ICD-10-CM

## 2022-12-02 DIAGNOSIS — R9431 Abnormal electrocardiogram [ECG] [EKG]: Secondary | ICD-10-CM | POA: Diagnosis not present

## 2022-12-02 LAB — CBC
HCT: 45 % (ref 36.0–46.0)
Hemoglobin: 14.5 g/dL (ref 12.0–15.0)
MCH: 27.8 pg (ref 26.0–34.0)
MCHC: 32.2 g/dL (ref 30.0–36.0)
MCV: 86.4 fL (ref 80.0–100.0)
Platelets: 239 10*3/uL (ref 150–400)
RBC: 5.21 MIL/uL — ABNORMAL HIGH (ref 3.87–5.11)
RDW: 13.6 % (ref 11.5–15.5)
WBC: 10.4 10*3/uL (ref 4.0–10.5)
nRBC: 0 % (ref 0.0–0.2)

## 2022-12-02 LAB — COMPREHENSIVE METABOLIC PANEL
ALT: 30 U/L (ref 0–44)
AST: 32 U/L (ref 15–41)
Albumin: 4.4 g/dL (ref 3.5–5.0)
Alkaline Phosphatase: 47 U/L (ref 38–126)
Anion gap: 12 (ref 5–15)
BUN: 13 mg/dL (ref 8–23)
CO2: 20 mmol/L — ABNORMAL LOW (ref 22–32)
Calcium: 9.3 mg/dL (ref 8.9–10.3)
Chloride: 106 mmol/L (ref 98–111)
Creatinine, Ser: 0.71 mg/dL (ref 0.44–1.00)
GFR, Estimated: >60 mL/min (ref >60)
Glucose, Bld: 217 mg/dL — ABNORMAL HIGH (ref 70–99)
Potassium: 3.8 mmol/L (ref 3.5–5.1)
Sodium: 138 mmol/L (ref 135–145)
Total Bilirubin: 1.1 mg/dL (ref 0.3–1.2)
Total Protein: 7.8 g/dL (ref 6.5–8.1)

## 2022-12-02 LAB — LIPASE, BLOOD: Lipase: 26 U/L (ref 11–51)

## 2022-12-02 NOTE — ED Provider Notes (Signed)
[facility] Provider Note   Event Date/Time   First MD Initiated Contact with Patient 12/02/22 2020     (approximate) History  Abdominal Pain  HPI Kara [name] is a 72 y.o. female with a past medical history of umbilical hernia who presents for abdominal pain after nausea/vomiting earlier today.  Patient states that she had a meal that was "questionable" and soon after began having episodes of nausea/vomiting with associated periumbilical pain.  Patient states that this has happened previously when she has had nausea/vomiting due to the increased pressure causing her umbilical hernia and to protrude. ROS: Patient currently denies any vision changes, tinnitus, difficulty speaking, facial droop, sore throat, chest pain, shortness of breath, diarrhea, dysuria, or weakness/numbness/paresthesias in any extremity   Physical Exam  Triage Vital Signs: ED Triage Vitals  Encounter Vitals Group     BP 12/02/22 1918 (!) 151/130     Systolic BP Percentile --      Diastolic BP Percentile --      Pulse Rate 12/02/22 1918 71     Resp 12/02/22 1918 20     Temp 12/02/22 1918 98.3 F (36.8 C)     Temp Source 12/02/22 1918 Oral     SpO2 12/02/22 1918 96 %     Weight 12/02/22 1919 201 lb (91.2 kg)     Height 12/02/22 1919 5\' 3"  (1.6 m)     Head Circumference --      Peak Flow --      Pain Score 12/02/22 1919 10     Pain Loc --      Pain Education --      Exclude from Growth Chart --    Most recent vital signs: Vitals:   12/02/22 1918  BP: (!) 151/130  Pulse: 71  Resp: 20  Temp: 98.3 F (36.8 C)  SpO2: 96%   General: Awake, oriented x4. CV:  Good peripheral perfusion.  Resp:  Normal effort.  Abd:  No distention.  Other:  Elderly obese Caucasian female laying in bed in no acute distress ED Results / Procedures / Treatments  Labs (all labs ordered are listed, but only abnormal results are displayed) Labs Reviewed  COMPREHENSIVE METABOLIC PANEL -  Abnormal; Notable for the following components:      Result Value   CO2 20 (*)    Glucose, Bld 217 (*)    All other components within normal limits  CBC - Abnormal; Notable for the following components:   RBC 5.21 (*)    All other components within normal limits  LIPASE, BLOOD  URINALYSIS, ROUTINE W REFLEX MICROSCOPIC   EKG ED ECG REPORT I, Merwyn Katos, the attending physician, personally viewed and interpreted this ECG. Date: 12/02/2022 EKG Time: 1926 Rate: 71 Rhythm: normal sinus rhythm QRS Axis: normal Intervals: normal ST/T Wave abnormalities: normal Narrative Interpretation: no evidence of acute ischemia PROCEDURES: Critical Care performed: No .1-3 Lead EKG Interpretation  Performed by: Merwyn Katos, MD Authorized by: Merwyn Katos, MD     Interpretation: normal     ECG rate:  71   ECG rate assessment: normal     Rhythm: sinus rhythm     Ectopy: none     Conduction: normal    MEDICATIONS ORDERED IN ED: Medications - No data to display IMPRESSION / MDM / ASSESSMENT AND PLAN / ED COURSE  I reviewed the triage vital signs and the nursing notes.  The patient is on the cardiac monitor to evaluate for evidence of arrhythmia and/or significant heart rate changes. Patient's presentation is most consistent with acute presentation with potential threat to life or bodily function. Patient presents for acute nausea/vomiting The cause of the patients symptoms is not clear, but the patient is overall well appearing and is suspected to have a transient course of illness.  Given History and Exam there does not appear to be an emergent cause of the symptoms such as small bowel obstruction, coronary syndrome, bowel ischemia, DKA, pancreatitis, appendicitis, other acute abdomen or other emergent problem.  Reassessment: After treatment, the patient is feeling much better, tolerating PO fluids, and shows no signs of dehydration.   Disposition:  Discharge home with prompt primary care physician follow up in the next 48 hours. Strict return precautions discussed.   FINAL CLINICAL IMPRESSION(S) / ED DIAGNOSES   Final diagnoses:  Periumbilical abdominal pain  Nausea and vomiting, unspecified vomiting type  History of umbilical hernia   Rx / DC Orders   ED Discharge Orders     None      Note:  This document was prepared using Dragon voice recognition software and may include unintentional dictation errors.   Merwyn Katos, MD 12/02/22 2122

## 2022-12-02 NOTE — ED Triage Notes (Signed)
Pt reports abd pain since this afternoon. Hx of umbilical hernia and pain centered around that site. Abd tender to palpation. Pt reports n/v this afternoon and that pain onset after. Pt took zofran at home. Pt denies diarrhea or fever. Pt alert and oriented following commands. Breathing unlabored with symmetric chest rise and fall. Pt speaking in full sentences.

## 2022-12-02 NOTE — Progress Notes (Signed)
(  12/02/2022) NO-SHOW to postprocedure evaluation.

## 2022-12-07 ENCOUNTER — Ambulatory Visit
Admission: RE | Admit: 2022-12-07 | Discharge: 2022-12-07 | Disposition: A | Discharge status: Home / Self Care | Payer: PPO | Source: Ambulatory Visit | Attending: Pain Medicine | Admitting: Pain Medicine

## 2022-12-07 ENCOUNTER — Ambulatory Visit: Payer: PPO | Attending: Pain Medicine | Admitting: Pain Medicine

## 2022-12-07 ENCOUNTER — Encounter: Payer: Self-pay | Admitting: Pain Medicine

## 2022-12-07 VITALS — BP 157/75 | HR 75 | Temp 97.7°F | Resp 20 | Ht 63.0 in | Wt 203.0 lb

## 2022-12-07 DIAGNOSIS — M7918 Myalgia, other site: Secondary | ICD-10-CM | POA: Diagnosis present

## 2022-12-07 DIAGNOSIS — M47817 Spondylosis without myelopathy or radiculopathy, lumbosacral region: Secondary | ICD-10-CM

## 2022-12-07 DIAGNOSIS — Z09 Encounter for follow-up examination after completed treatment for conditions other than malignant neoplasm: Secondary | ICD-10-CM | POA: Diagnosis not present

## 2022-12-07 DIAGNOSIS — M4306 Spondylolysis, lumbar region: Secondary | ICD-10-CM

## 2022-12-07 DIAGNOSIS — Z7901 Long term (current) use of anticoagulants: Secondary | ICD-10-CM

## 2022-12-07 DIAGNOSIS — M545 Low back pain, unspecified: Secondary | ICD-10-CM

## 2022-12-07 DIAGNOSIS — M5459 Other low back pain: Secondary | ICD-10-CM | POA: Diagnosis not present

## 2022-12-07 DIAGNOSIS — Z91040 Latex allergy status: Secondary | ICD-10-CM

## 2022-12-07 DIAGNOSIS — M4310 Spondylolisthesis, site unspecified: Secondary | ICD-10-CM

## 2022-12-07 DIAGNOSIS — G8929 Other chronic pain: Secondary | ICD-10-CM | POA: Insufficient documentation

## 2022-12-07 DIAGNOSIS — M47816 Spondylosis without myelopathy or radiculopathy, lumbar region: Secondary | ICD-10-CM

## 2022-12-07 MED ORDER — MIDAZOLAM HCL 2 MG/2ML IJ SOLN
0.5000 mg | Freq: Once | INTRAMUSCULAR | Status: AC
  Administered 2022-12-07: 3 mg via INTRAVENOUS

## 2022-12-07 MED ORDER — TRIAMCINOLONE ACETONIDE 40 MG/ML IJ SUSP
80.0000 mg | Freq: Once | INTRAMUSCULAR | Status: AC
  Administered 2022-12-07: 80 mg

## 2022-12-07 MED ORDER — LACTATED RINGERS IV SOLN: Freq: Once | INTRAVENOUS | Status: AC

## 2022-12-07 MED ORDER — ROPIVACAINE HCL 2 MG/ML IJ SOLN
18.0000 mL | Freq: Once | INTRAMUSCULAR | Status: AC
  Administered 2022-12-07: 18 mL via PERINEURAL

## 2022-12-07 MED ORDER — ROPIVACAINE HCL 2 MG/ML IJ SOLN
INTRAMUSCULAR | Status: AC
  Filled 2022-12-07: qty 20

## 2022-12-07 MED ORDER — MIDAZOLAM HCL 5 MG/5ML IJ SOLN
INTRAMUSCULAR | Status: AC
  Filled 2022-12-07: qty 5

## 2022-12-07 MED ORDER — PENTAFLUOROPROP-TETRAFLUOROETH EX AERO
INHALATION_SPRAY | Freq: Once | CUTANEOUS | Status: AC
  Administered 2022-12-07: 30 via TOPICAL
  Filled 2022-12-07: qty 30

## 2022-12-07 MED ORDER — LIDOCAINE HCL 2 % IJ SOLN
INTRAMUSCULAR | Status: AC
  Filled 2022-12-07: qty 20

## 2022-12-07 MED ORDER — LIDOCAINE HCL 2 % IJ SOLN
20.0000 mL | Freq: Once | INTRAMUSCULAR | Status: AC
  Administered 2022-12-07: 400 mg

## 2022-12-07 MED ORDER — TRIAMCINOLONE ACETONIDE 40 MG/ML IJ SUSP
INTRAMUSCULAR | Status: AC
  Filled 2022-12-07: qty 2

## 2022-12-07 NOTE — Progress Notes (Signed)
PROVIDER NOTE: Interpretation of information contained herein should be left to medically-trained personnel. Specific patient instructions are provided elsewhere under "Patient Instructions" section of medical record. This document was created in part using STT-dictation technology, any transcriptional errors that may result from this process are unintentional.  Patient: Kara [name] Type: Established DOB: [birth year] MRN: [id number] PCP: Barbette Reichmann, MD  Service: Procedure DOS: 12/07/2022 Setting: Ambulatory Location: Ambulatory outpatient facility Delivery: Face-to-face Provider: Oswaldo Done, MD Specialty: Interventional Pain Management Specialty designation: 09 Location: Outpatient facility Ref. Prov.: Barbette Reichmann, MD       Interventional Therapy   Procedure: Lumbar Facet, Medial Branch Block(s)  [id number]   Laterality: Bilateral  Level: L2, L3, L4, L5, and S1 Medial Branch Level(s). Injecting these levels blocks the L3-4, L4-5, and L5-S1 lumbar facet joints. Imaging: Fluoroscopic guidance         Anesthesia: Local anesthesia (1-2% Lidocaine) Anxiolysis: IV Versed         Sedation: No Sedation                       DOS: 12/07/2022 Performed by: Oswaldo Done, MD  Primary Purpose: Diagnostic/Therapeutic Indications: Low back pain severe enough to impact quality of life or function. 1. Chronic low back pain (1ry area of Pain) (Bilateral) w/o sciatica   2. 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 Osteoarthritis of lumbar spine   4. Spondylosis without myelopathy or radiculopathy, lumbosacral region   5. Lumbar facet joint pain   6. Lumbar facet arthropathy (L3-4, L4-5, and L5-S1) (Bilateral)   7. 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 Lumbar facet syndrome (Bilateral)   9. 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 (year old), female patient, seen today for interventional treatment. She  has a past surgical history that includes Knee surgery; Carpal tunnel release (Bilateral); Colon resection sigmoid (N/A, 11/02/2016); Colostomy (N/A, 11/02/2016); Sigmoidoscopy (N/A, 11/13/2016); PULMONARY VENOGRAPHY (N/A, 11/19/2016); Colonoscopy with propofol (N/A, 02/03/2017); Colon surgery (11/02/2016); IVC FILTER INSERTION (Right, 10/2016); Colostomy reversal (N/A, 02/24/2017); Appendectomy (N/A, 02/24/2017); Lysis of adhesion (N/A, 02/24/2017); laparoscopy (N/A, 02/24/2017); Ileo loop diversion (N/A, 02/24/2017); Sigmoidoscopy (N/A, 05/19/2017); Ileostomy closure (N/A, 06/01/2017); IVC FILTER REMOVAL (N/A, 08/09/2017); Sacroplasty (N/A, 11/08/2017); Cataract extraction w/PHACO (Left, 11/21/2018); Cataract extraction w/PHACO (Right, 12/12/2018); and Video bronchoscopy with endobronchial navigation (N/A, 02/23/2021). Kara [name] has a current medication list which includes the following prescription(s): calcium carbonate, candesartan, cholecalciferol, citalopram, vitamin b-12, cyclobenzaprine, dulaglutide, gabapentin, gabapentin, glipizide, hydrocodone-acetaminophen, magnesium oxide -mg supplement, meclizine, oxybutynin, ropinirole, and warfarin. Her primarily concern today is the Back Pain (lower)  Initial Vital Signs:  Pulse/HCG Rate: 75ECG Heart Rate: 73 (NSR) Temp: 97.7 F (36.5 C) Resp: 15 BP: 126/75 SpO2: 98 %  BMI: Estimated body mass index is 35.96 kg/m  as calculated from the following:   Height as of  this encounter: 5\' 3"  (1.6 m).   Weight as of this encounter: 203 lb (92.1 kg).  Risk Assessment: Allergies: Reviewed. She is allergic to adhesive [tape], other, latex, and morphine and codeine.  Allergy Precautions: None required Coagulopathies: Reviewed. None identified.  Blood-thinner therapy: None at this time Active Infection(s): Reviewed. None identified. Kara [name] is afebrile  Site Confirmation: [name] was asked to confirm the procedure and laterality before marking the site Procedure checklist: Completed Consent: Before the procedure and under the influence of no sedative(s), amnesic(s), or anxiolytics, the patient was informed of the treatment options, risks and possible complications. To fulfill our ethical and legal obligations, as recommended by the American Medical Association's Code of Ethics, I have informed the patient of my clinical impression; the nature and purpose of the treatment or procedure; the risks, benefits, and possible complications of the intervention; the alternatives, including doing nothing; the risk(s) and benefit(s) of the alternative treatment(s) or procedure(s); and the risk(s) and benefit(s) of doing nothing. The patient was provided information about the general risks and possible complications associated with the procedure. These may include, but are not limited to: failure to achieve desired goals, infection, bleeding, organ or nerve damage, allergic reactions, paralysis, and death. In addition, the patient was informed of those risks and complications associated to Spine-related procedures, such as failure to decrease pain; infection (i.e.: Meningitis, epidural or intraspinal abscess); bleeding (i.e.: epidural hematoma, subarachnoid hemorrhage, or any other type of intraspinal or peri-dural bleeding); organ or nerve damage (i.e.: Any type of peripheral nerve, nerve root, or spinal cord injury) with  subsequent damage to sensory, motor, and/or autonomic systems, resulting in permanent pain, numbness, and/or weakness of one or several areas of the body; allergic reactions; (i.e.: anaphylactic reaction); and/or death. Furthermore, the patient was informed of those risks and complications associated with the medications. These include, but are not limited to: allergic reactions (i.e.: anaphylactic or anaphylactoid reaction(s)); adrenal axis suppression; blood sugar elevation that in diabetics may result in ketoacidosis or comma; water retention that in patients with history of congestive heart failure may result in shortness of breath, pulmonary edema, and decompensation with resultant heart failure; weight gain; swelling or edema; medication-induced neural toxicity; particulate matter embolism and blood vessel occlusion with resultant organ, and/or nervous system infarction; and/or aseptic necrosis of one or more joints. Finally, the patient was informed that Medicine is not an exact science; therefore, there is also the possibility of unforeseen or unpredictable risks and/or possible complications that may result in a catastrophic outcome. The patient indicated having understood very clearly. We have given the patient no guarantees and we have made no promises. Enough time was given to the patient to ask questions, all of which were answered to the patient's satisfaction. Ms. Rasmus has indicated that she wanted to continue with the procedure. Attestation: I, the ordering provider, attest that I have discussed with the patient the benefits, risks, side-effects, alternatives, likelihood of achieving goals, and potential problems during recovery for the procedure that I have provided informed consent. Date  Time: 12/07/2022  9:02 AM   Pre-Procedure Preparation:  Monitoring: As per clinic protocol. Respiration, ETCO2, SpO2, BP, heart rate and rhythm monitor placed and checked for adequate function Safety  Precautions: Patient was assessed for positional comfort and pressure points before starting the procedure. Time-out: I initiated and conducted the "Time-out" before starting the procedure, as per protocol. The patient was asked to participate by confirming the accuracy of the "Time Out" information. Verification of  the correct person, site, and procedure were performed and confirmed by me, the nursing staff, and the patient. "Time-out" conducted as per Joint Commission's Universal Protocol (UP.01.01.01). Time: 1045 Start Time: 1045 hrs.  Description of Procedure:          Laterality: (see above) Targeted Levels: (see above)  Safety Precautions: Aspiration looking for blood return was conducted prior to all injections. At no point did we inject any substances, as a needle was being advanced. Before injecting, the patient was told to immediately notify me if she was experiencing any new onset of "ringing in the ears, or metallic taste in the mouth". No attempts were made at seeking any paresthesias. Safe injection practices and needle disposal techniques used. Medications properly checked for expiration dates. SDV (single dose vial) medications used. After the completion of the procedure, all disposable equipment used was discarded in the proper designated medical waste containers. Local Anesthesia: Protocol guidelines were followed. The patient was positioned over the fluoroscopy table. The area was prepped in the usual manner. The time-out was completed. The target area was identified using fluoroscopy. A 12-in long, straight, sterile hemostat was used with fluoroscopic guidance to locate the targets for each level blocked. Once located, the skin was marked with an approved surgical skin marker. Once all sites were marked, the skin (epidermis, dermis, and hypodermis), as well as deeper tissues (fat, connective tissue and muscle) were infiltrated with a small amount of a short-acting local anesthetic,  loaded on a 10cc syringe with a 25G, 1.5-in  Needle. An appropriate amount of time was allowed for local anesthetics to take effect before proceeding to the next step. Local Anesthetic: Lidocaine 2.0% The unused portion of the local anesthetic was discarded in the proper designated containers. Technical description of process:  L2 Medial Branch Nerve Block (MBB): The target area for the L2 medial branch is at the junction of the postero-lateral aspect of the superior articular process and the superior, posterior, and medial edge of the transverse process of L3. Under fluoroscopic guidance, a Quincke needle was inserted until contact was made with os over the superior postero-lateral aspect of the pedicular shadow (target area). After negative aspiration for blood, 0.5 mL of the nerve block solution was injected without difficulty or complication. The needle was removed intact. L3 Medial Branch Nerve Block (MBB): The target area for the L3 medial branch is at the junction of the postero-lateral aspect of the superior articular process and the superior, posterior, and medial edge of the transverse process of L4. Under fluoroscopic guidance, a Quincke needle was inserted until contact was made with os over the superior postero-lateral aspect of the pedicular shadow (target area). After negative aspiration for blood, 0.5 mL of the nerve block solution was injected without difficulty or complication. The needle was removed intact. L4 Medial Branch Nerve Block (MBB): The target area for the L4 medial branch is at the junction of the postero-lateral aspect of the superior articular process and the superior, posterior, and medial edge of the transverse process of L5. Under fluoroscopic guidance, a Quincke needle was inserted until contact was made with os over the superior postero-lateral aspect of the pedicular shadow (target area). After negative aspiration for blood, 0.5 mL of the nerve block solution was injected  without difficulty or complication. The needle was removed intact. L5 Medial Branch Nerve Block (MBB): The target area for the L5 medial branch is at the junction of the postero-lateral aspect of the superior articular process and the  superior, posterior, and medial edge of the sacral ala. Under fluoroscopic guidance, a Quincke needle was inserted until contact was made with os over the superior postero-lateral aspect of the pedicular shadow (target area). After negative aspiration for blood, 0.5 mL of the nerve block solution was injected without difficulty or complication. The needle was removed intact. S1 Medial Branch Nerve Block (MBB): The target area for the S1 medial branch is at the posterior and inferior 6 o'clock position of the L5-S1 facet joint. Under fluoroscopic guidance, the Quincke needle inserted for the L5 MBB was redirected until contact was made with os over the inferior and postero aspect of the sacrum, at the 6 o' clock position under the L5-S1 facet joint (Target area). After negative aspiration for blood, 0.5 mL of the nerve block solution was injected without difficulty or complication. The needle was removed intact.  Once the entire procedure was completed, the treated area was cleaned, making sure to leave some of the prepping solution back to take advantage of its long term bactericidal properties.         Illustration of the posterior view of the lumbar spine and the posterior neural structures. Laminae of L2 through S1 are labeled. DPRL5, dorsal primary ramus of L5; DPRS1, dorsal primary ramus of S1; DPR3, dorsal primary ramus of L3; FJ, facet (zygapophyseal) joint L3-L4; I, inferior articular process of L4; LB1, lateral branch of dorsal primary ramus of L1; IAB, inferior articular branches from L3 medial branch (supplies L4-L5 facet joint); IBP, intermediate branch plexus; MB3, medial branch of dorsal primary ramus of L3; NR3, third lumbar nerve root; S, superior articular  process of L5; SAB, superior articular branches from L4 (supplies L4-5 facet joint also); TP3, transverse process of L3.   Facet Joint Innervation (* possible contribution)  L1-2 T12, L1 (L2*)  Medial Branch  L2-3 L1, L2 (L3*)         "          "  L3-4 L2, L3 (L4*)         "          "  L4-5 L3, L4 (L5*)         "          "  L5-S1 L4, L5, S1          "          "    Vitals:   12/07/22 1057 12/07/22 1106 12/07/22 1116 12/07/22 1126  BP: (!) 152/82 118/78 (!) 144/76 (!) 157/75  Pulse:      Resp: 17 19 18 20   Temp:      TempSrc:      SpO2: 100% 97% 96% 96%  Weight:      Height:         End Time: 1057 hrs.  Imaging Guidance (Spinal):          Type of Imaging Technique: Fluoroscopy Guidance (Spinal) Indication(s): Assistance in needle guidance and placement for procedures requiring needle placement in or near specific anatomical locations not easily accessible without such assistance. Exposure Time: Please see nurses notes. Contrast: None used. Fluoroscopic Guidance: I was personally present during the use of fluoroscopy. "Tunnel Vision Technique" used to obtain the best possible view of the target area. Parallax error corrected before commencing the procedure. "Direction-depth-direction" technique used to introduce the needle under continuous pulsed fluoroscopy. Once target was reached, antero-posterior, oblique, and lateral fluoroscopic projection used confirm needle placement in all planes. Images  permanently stored in EMR. Interpretation: No contrast injected. I personally interpreted the imaging intraoperatively. Adequate needle placement confirmed in multiple planes. Permanent images saved into the patient's record.  Post-operative Assessment:  Post-procedure Vital Signs:  Pulse/HCG Rate: 7574 Temp: 97.7 F (36.5 C) Resp: 20 BP: (!) 157/75 SpO2: 96 %  EBL: None  Complications: No immediate post-treatment complications observed by team, or reported by patient.  Note:  The patient tolerated the entire procedure well. A repeat set of vitals were taken after the procedure and the patient was kept under observation following institutional policy, for this type of procedure. Post-procedural neurological assessment was performed, showing return to baseline, prior to discharge. The patient was provided with post-procedure discharge instructions, including a section on how to identify potential problems. Should any problems arise concerning this procedure, the patient was given instructions to immediately contact us, at any time, without hesitation. In any case, we plan to contact the patient by telephone for a follow-up status report regarding this interventional procedure.  Comments:  No additional relevant information.  Plan of Care (POC)  Orders:  Orders Placed This Encounter  Procedures   LUMBAR FACET(MEDIAL BRANCH NERVE BLOCK) MBNB    Scheduling Instructions:     Procedure: Lumbar facet block (AKA.: Lumbosacral medial branch nerve block)     Side: Bilateral     Level: L3-4, L4-5, and L5-S1 Facets (L2, L3, L4, L5, and S1 Medial Branch Nerves)     Sedation: Patient's choice.     Timeframe: Today    Order Specific Question:   Where will this procedure be performed?    Answer:   [name]   DG [facility] C-ARM 1-60 MIN NO REPORT    Intraoperative interpretation by procedural physician at [facility].    Standing Status:   Standing    Number of Occurrences:   1    Order Specific Question:   Reason for exam:    Answer:   Assistance in needle guidance and placement for procedures requiring needle placement in or near specific anatomical locations not easily accessible without such assistance.   Nursing Instructions:    Please complete this patient's postprocedure evaluation.    Scheduling Instructions:     Please complete this patient's postprocedure evaluation.   Informed Consent Details: Physician/Practitioner Attestation; Transcribe to  consent form and obtain patient signature    Nursing Order: Transcribe to consent form and obtain patient signature. Note: Always confirm laterality of pain with Ms. Arteaga, before procedure.    Order Specific Question:   Physician/Practitioner attestation of informed consent for procedure/surgical case    Answer:   I, the physician/practitioner, attest that I have discussed with the patient the benefits, risks, side effects, alternatives, likelihood of achieving goals and potential problems during recovery for the procedure that I have provided informed consent.    Order Specific Question:   Procedure    Answer:   Lumbar Facet Block  under fluoroscopic guidance    Order Specific Question:   Physician/Practitioner performing the procedure    Answer:   Yunique Dearcos A. Laban Emperor MD    Order Specific Question:   Indication/Reason    Answer:   Low Back Pain, with our without leg pain, due to Facet Joint Arthralgia (Joint Pain) Spondylosis (Arthritis of the Spine), without myelopathy or radiculopathy (Nerve Damage).   Care order/instruction: Please confirm that the patient has stopped the Coumadin (Warfarin) X 5 days prior to procedure or surgery.    Please confirm that the  patient has stopped the Coumadin (Warfarin) X 5 days prior to procedure or surgery.    Standing Status:   Standing    Number of Occurrences:   1   Provide equipment / supplies at bedside    Procedure tray: "Block Tray" (Disposable  single use) Skin infiltration needle: Regular 1.5-in, 25-G, (x1) Block Needle type: Spinal Amount/quantity: 4 Size: Regular (3.5-inch) Gauge: 22G    Standing Status:   Standing    Number of Occurrences:   1    Order Specific Question:   Specify    Answer:   Block Tray   Latex precautions    Activate Latex-Free Protocol.    Standing Status:   Standing    Number of Occurrences:   1   Chronic Opioid Analgesic:  No chronic opioid analgesics therapy prescribed by our practice, 2ry to Medication  Agreement Violation. Oxycodone IR 10 mg, 1 tab PO qd,  from PCP.  (Getting oxycodone from PCP in multiple occasions, according to PMP)(11/21/20; 12/26/20; 01/16/21; 02/13/21). Opioid analgesic pharmacotherapy D/C'ed on 03/03/2021. MME: 20 mg/day.   Medications ordered for procedure: Meds ordered this encounter  Medications   lidocaine (XYLOCAINE) 2 % (with pres) injection 400 mg   pentafluoroprop-tetrafluoroeth (GEBAUERS) aerosol   lactated ringers infusion   midazolam (VERSED) injection 0.5-2 mg    Make sure Flumazenil is available in the pyxis when using this medication. If oversedation occurs, administer 0.2 mg IV over 15 sec. If after 45 sec no response, administer 0.2 mg again over 1 min; may repeat at 1 min intervals; not to exceed 4 doses (1 mg)   ropivacaine (PF) 2 mg/mL (0.2%) (NAROPIN) injection 18 mL   triamcinolone acetonide (KENALOG-40) injection 80 mg   Medications administered: We administered lidocaine, pentafluoroprop-tetrafluoroeth, lactated ringers, midazolam, ropivacaine (PF) 2 mg/mL (0.2%), and triamcinolone acetonide.  See the medical record for exact dosing, route, and time of administration.  Follow-up plan:   Return in about 2 weeks (around 12/21/2022) for Proc-day (T,Th), (Face2F), (PPE).       Interventional Therapies  Risk  Complexity Considerations:   ANTICOAGULATION: Coumadin (Stop: 5 days  Re-start: 2hrs)  Hx DVT & PE ALLERGY: Latex Morbid obesity (class III) (BMI>40)  metabolic syndrome  NIDDM  HTN Difficult Lumbar RFA due to morbid obesity    Planned  Pending: Diagnostic/therapeutic right piriformis muscle injection #1 (no steroids)    Under consideration:   Therapeutic bilateral lumbar facet RFA #2 (patient indicates preferring to have only the block without radiofrequency.)   Completed:   Therapeutic bilateral lumbar facet MBB (L3-S1) x1 (04/06/2022) (80/80/90/90)  Diagnostic/therapeutic right IA hip inj. x2 (01/28/2022) (100/100/85/85)   Diagnostic/therapeutic left IA hip inj. x1 (01/28/2022) (100/100/85/85)  Diagnostic/therapeutic right trochanteric bursa inj. x1 (09/15/2021) (85/85/85)  Diagnostic/therapeutic left trochanteric bursa inj. x1 (01/28/2022) (100/100/85/85)  Therapeutic left lumbar facet RFA x1 (07/21/2021) (100/100/100 x1 week/80) (Lasted 9 months)  Therapeutic right lumbar facet RFA x1 (07/07/2021) (100/100/80/80) (Lasted 9 months)  Palliative/Therapeutic left L4-5 LESI x1 (11/10/2017) (100/80/0/0-25)  Diagnostic/therapeutic right L4 TFESI x1 (11/19/2021) (90/80/0/0) (fell at [employer]) Palliative left lumbar facet (L2-S1) MBB x19 (10/12/2022) (100/100/0/100/100)  Palliative right lumbar facet (L2-S1) MBB x26 (10/12/2022) (100/100/0/100/100)  Palliative right SI joint block x9 (01/28/2022) (100/100/80/80)  Diagnostic left SI joint block x2 (01/28/2022) (100/100/80/80)  Palliative/Therapeutic (Midline) caudal ESI x3 (06/08/2018) (100/100/100 x 2-3 days/0)    Therapeutic  Palliative (PRN) options:   NO PRN procedures without F2F exam by Dr. Laban Emperor due to multifactorial etiology of chronic pain.  No more procedures until after 08/14/2022.    Pharmacotherapy  Nonopioids transferred 03/27/2020: Calcium        Recent Visits Date Type Provider Dept  11/09/22 Procedure visit Delano Metz, MD [name]  11/01/22 Office Visit Delano Metz, MD [name]  10/12/22 Procedure visit Edward Jolly, MD [name]  09/29/22 Office Visit Delano Metz, MD [name]  Showing recent visits within past 90 days and meeting all other requirements Today's Visits Date Type Provider Dept  12/07/22 Procedure visit Delano Metz, MD [name]  Showing today's visits and meeting all other requirements Future Appointments Date Type Provider Dept  12/28/22 Appointment Delano Metz, MD [name]  Showing future appointments within next 90  days and meeting all other requirements  Disposition: Discharge home  Discharge (Date  Time): 12/07/2022; 1130 hrs.   Primary Care Physician: Barbette Reichmann, MD Location: [facility] Note by: Oswaldo Done, MD (TTS technology used. I apologize for any typographical errors that were not detected and corrected.) Date: 12/07/2022; Time: 11:53 AM  Disclaimer:  Medicine is not an Visual merchandiser. The only guarantee in medicine is that nothing is guaranteed. It is important to note that the decision to proceed with this intervention was based on the information collected from the patient. The Data and conclusions were drawn from the patient's questionnaire, the interview, and the physical examination. Because the information was provided in large part by the patient, it cannot be guaranteed that it has not been purposely or unconsciously manipulated. Every effort has been made to obtain as much relevant data as possible for this evaluation. It is important to note that the conclusions that lead to this procedure are derived in large part from the available data. Always take into account that the treatment will also be dependent on availability of resources and existing treatment guidelines, considered by other Pain Management Practitioners as being common knowledge and practice, at the time of the intervention. For Medico-Legal purposes, it is also important to point out that variation in procedural techniques and pharmacological choices are the acceptable norm. The indications, contraindications, technique, and results of the above procedure should only be interpreted and judged by a Board-Certified Interventional Pain Specialist with extensive familiarity and expertise in the same exact procedure and technique.

## 2022-12-07 NOTE — Progress Notes (Signed)
PROVIDER NOTE: Information contained herein reflects review and annotations entered in association with encounter. Interpretation of such information and data should be left to medically-trained personnel. Information provided to patient can be located elsewhere in the medical record under "Patient Instructions". Document created using STT-dictation technology, any transcriptional errors that may result from process are unintentional.    Patient: Kara [name]  Service Category: E/M  Provider: Oswaldo Done, MD  DOB: 06/29/1950  DOS: 12/07/2022  Referring Provider: Barbette Reichmann, MD  MRN: [id number]  Specialty: Interventional Pain Management  PCP: Barbette Reichmann, MD  Type: Established Patient  Setting: Ambulatory outpatient    Location: Office  Delivery: Face-to-face     HPI  [name], a 72 y.o. year old female, is here today because of Kara [name] Chronic bilateral low back pain without sciatica [M54.50, G89.29]. [name] primary complain today is No chief complaint on file.  Pertinent problems: 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); Lumbar pars defect (L3 and L4) (Bilateral); Diabetic peripheral neuropathy (HCC); Chronic lower extremity pain (2ry area of Pain) (Bilateral); Chronic radicular pain of lower extremity; Osteoarthritis of hip (Right); Chronic low back pain (1ry area of Pain) (Bilateral) w/o sciatica; Spondylosis  without myelopathy or radiculopathy, lumbosacral region; Other specified dorsopathies, sacral and sacrococcygeal region; Secondary osteoarthritis of multiple sites; Sacral insufficiency fracture; DDD (degenerative disc disease), lumbosacral; Coccygodynia; Chronic sacroiliac joint pain (Left); Osteoarthritis of lumbar spine; Spondylolisthesis, lumbosacral region; Abnormal MRI, lumbar spine (08/09/2019); Spasm of muscle of lower back; Chronic hip pain (Bilateral) (R>L); Osteoarthritis of hips (Bilateral); Greater trochanteric bursitis (Right); Chronic hip pain (Right); Chronic low back pain (Right) w/o sciatica; Greater trochanteric bursitis (Left); Other bursitis of hip, not elsewhere classified (gluteus medius bursa) (Left); Lumbar facet joint pain; and Piriformis muscle pain (Right) on their pertinent problem list. Pain Assessment: Severity of   is reported as a  /10. Location:    / . Onset:  . Quality:  . Timing:  . Modifying factor(s):  Marland Kitchen Vitals:  vitals were not taken for this visit.  BMI: Estimated body mass index is 35.61 kg/m as calculated from the following:   Height as of 12/02/22: 5\' 3"  (1.6 m).   Weight as of 12/02/22: 201 lb (91.2 kg). Last encounter: 12/02/2022. Last procedure: 11/09/2022.  Reason for encounter: post-procedure evaluation and assessment.  The patient indicated having attained 100% relief of the pain for the first hour, followed by a decrease to a 90% improvement for the next 4 to 6 hours.  Unfortunately, once the local anesthetic wore off the patient had no further long-term benefit.  No steroids had been added to the mixture.  Post-procedure evaluation    Procedure:          Anesthesia, Analgesia, Anxiolysis:  Type: Piriformis Trigger Point Injection (1-2 muscle groups) #1 (NO STEROIDS) CPT: 20552 Primary Purpose: Diagnostic Region: Posterior Sacrococcygeal (buttocks area) Level: Sacral Target Area: Piriformis muscle Trigger Point Approach: Percutaneous, ipsilateral  approach. Laterality: Right        Type: Local Anesthesia Local Anesthetic: Lidocaine 1-2% Sedation: None  Indication(s):  Analgesia Route: Infiltration ([place]/IM) IV Access: N/A   Position: Prone   1. Piriformis muscle pain (Right)   2. Osteoarthritis of hip (Right)   3. Chronic hip pain (Right)   4. Chronic sacroiliac joint pain (Right)   5. Chronic anticoagulation (COUMADIN)   6. Latex precautions, history of latex allergy    NAS-11 Pain score:   Pre-procedure: 9 /10   Post-procedure: 5 /10      Effectiveness:  Initial hour after procedure: 100 %. Subsequent 4-6 hours post-procedure: 90 %. Analgesia past initial 6 hours: 0 %. Ongoing improvement:  Analgesic: No long-term benefit Function: No improvement ROM: No improvement  Pharmacotherapy Assessment  Analgesic: No chronic opioid analgesics therapy prescribed by our practice, 2ry to Medication Agreement Violation. Oxycodone IR 10 mg, 1 tab PO qd,  from PCP.  (Getting oxycodone from PCP in multiple occasions, according to PMP)(11/21/20; 12/26/20; 01/16/21; 02/13/21). Opioid analgesic pharmacotherapy D/C'ed on 03/03/2021. MME: 20 mg/day.   Monitoring: [place] PMP: PDMP not reviewed this encounter.       Pharmacotherapy: No side-effects or adverse reactions reported. Compliance: No problems identified. Effectiveness: Clinically acceptable.  No notes on file  No results found for: "CBDTHCR" No results found for: "D8THCCBX" No results found for: "D9THCCBX"  UDS:  Summary  Date Value Ref Range Status  11/12/2020 Note  Final    Comment:    ==================================================================== [employer] Select 13 (MW) ==================================================================== Test                             Result       Flag       Units  Drug Present and Declared for Prescription Verification   Norhydrocodone                 61           EXPECTED   ng/mg creat    Norhydrocodone is an expected  metabolite of hydrocodone.    Tramadol                       >4202        EXPECTED   ng/mg creat   O-Desmethyltramadol            >4202        EXPECTED   ng/mg creat   N-Desmethyltramadol            1712         EXPECTED   ng/mg creat    Source of tramadol is a prescription medication. O-desmethyltramadol    and N-desmethyltramadol are expected metabolites of tramadol.  Drug Absent but Declared for Prescription Verification   Hydrocodone                    Not Detected UNEXPECTED ng/mg creat    Hydrocodone is almost always present in patients taking this drug    consistently. Absence of hydrocodone could be due to lapse of time    since the last dose or unusual pharmacokinetics (rapid metabolism).  ==================================================================== Test                      Result    Flag   Units      Ref Range   Creatinine              119  mg/dL      >=42 ==================================================================== Declared Medications:  The flagging and interpretation on this report are based on the  following declared medications.  Unexpected results may arise from  inaccuracies in the declared medications.   **Note: The testing scope of this panel includes these medications:   Hydrocodone  Tramadol   **Note: The testing scope of this panel does not include the  following reported medications:   Acetaminophen  Alendronate (Fosamax)  Aspirin  Calcium  Candesartan  Cholecalciferol  Cyanocobalamin  Cyclobenzaprine  Dulaglutide  Gabapentin  Glipizide  Meclizine  Multivitamin  Nystatin  Ondansetron  Ropinirole  Tolterodine  Warfarin ==================================================================== For clinical consultation, please call [phone]. ====================================================================       ROS  Constitutional: Denies any fever or chills Gastrointestinal: No reported hemesis,  hematochezia, vomiting, or acute GI distress Musculoskeletal: Denies any acute onset joint swelling, redness, loss of ROM, or weakness Neurological: No reported episodes of acute onset apraxia, aphasia, dysarthria, agnosia, amnesia, paralysis, loss of coordination, or loss of consciousness  Medication Review  Dulaglutide, HYDROcodone-acetaminophen, Magnesium Oxide -Mg Supplement, Vitamin B-12, alendronate, calcium carbonate, candesartan, cholecalciferol, cyclobenzaprine, gabapentin, glipiZIDE, meclizine, oxybutynin, rOPINIRole, and warfarin  History Review  Allergy: Kara [name] is allergic to adhesive [tape], other, latex, and morphine and codeine. Drug: Kara [name]  reports no history of drug use. Alcohol:  reports no history of alcohol use. Tobacco:  reports that Kara [name] has never smoked. Kara [name] has never used smokeless tobacco. Social: Ms. Warsame  reports that Kara [name] has never smoked. Kara [name] has never used smokeless tobacco. Kara [name] reports that Kara [name] does not drink alcohol and does not use drugs. Medical:  has a past medical history of Anginal pain (HCC), Arthritis, CHF (congestive heart failure) (HCC), Diabetes (HCC), Diverticulitis (2018), History of being hospitalized, History of hiatal hernia, Hypertension, Osteoporosis, PE (pulmonary thromboembolism) (HCC), 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HCC), and Vertigo. Surgical: Ms. Sprung  has a past surgical history that includes Knee surgery; Carpal tunnel release (Bilateral); Colon resection sigmoid (N/A, 11/02/2016); Colostomy (N/A, 11/02/2016); Sigmoidoscopy (N/A, 11/13/2016); PULMONARY VENOGRAPHY (N/A, 11/19/2016); Colonoscopy with propofol (N/A, 02/03/2017); Colon surgery (11/02/2016); IVC FILTER INSERTION (Right, 10/2016); Colostomy reversal (N/A, 02/24/2017); Appendectomy (N/A, 02/24/2017); Lysis of adhesion (N/A, 02/24/2017); laparoscopy (N/A, 02/24/2017); [place]o loop diversion (N/A, 02/24/2017); Sigmoidoscopy (N/A, 05/19/2017); Ileostomy closure (N/A, 06/01/2017); IVC  FILTER REMOVAL (N/A, 08/09/2017); Sacroplasty (N/A, 11/08/2017); Cataract extraction w/PHACO (Left, 11/21/2018); Cataract extraction w/PHACO (Right, 12/12/2018); and Video bronchoscopy with endobronchial navigation (N/A, 02/23/2021). Family: family history includes Breast cancer in Kara [name] mother; Cancer in Kara [name] mother; Diabetes in Kara [name] mother and sister; Heart disease in Kara [name] father, mother, and sister.  Laboratory Chemistry Profile   Renal Lab Results  Component Value Date   BUN 13 12/02/2022   CREATININE 0.71 12/02/2022   GFRAA >60 07/09/2019   GFRNONAA >60 12/02/2022    Hepatic Lab Results  Component Value Date   AST 32 12/02/2022   ALT 30 12/02/2022   ALBUMIN 4.4 12/02/2022   ALKPHOS 47 12/02/2022   LIPASE 26 12/02/2022    Electrolytes Lab Results  Component Value Date   NA 138 12/02/2022   K 3.8 12/02/2022   CL 106 12/02/2022   CALCIUM 9.3 12/02/2022   MG 1.7 08/14/2020   PHOS 3.3 11/07/2016    Bone Lab Results  Component Value Date   25OHVITD1 25 (L) 11/02/2017   25OHVITD2 <1.0 11/02/2017   25OHVITD3 25 11/02/2017    Inflammation (CRP: Acute Phase) (ESR: Chronic Phase) Lab Results  Component Value Date  CRP 1.7 (H) 08/15/2020   ESRSEDRATE 12 11/02/2017   LATICACIDVEN 1.7 11/21/2021         Note: Above Lab results reviewed.  Recent Imaging Review  DG PAIN CLINIC C-ARM 1-60 MIN NO REPORT Fluoro was used, but no Radiologist interpretation will be provided.  Please refer to "NOTES" tab for provider progress note. Note: Reviewed        Physical Exam  General appearance: Well nourished, well developed, and well hydrated. In no apparent acute distress Mental status: Alert, oriented x 3 (person, place, & time)       Respiratory: No evidence of acute respiratory distress Eyes: PERLA Vitals: There were no vitals taken for this visit. BMI: Estimated body mass index is 35.61 kg/m as calculated from the following:   Height as of 12/02/22: 5\' 3"  (1.6 m).   Weight as of  12/02/22: 201 lb (91.2 kg). Ideal: Ideal body weight: 52.4 kg (115 lb 8.3 oz) Adjusted ideal body weight: 67.9 kg (149 lb 11.4 oz)  Assessment   Diagnosis Status  1. Chronic low back pain (1ry area of Pain) (Bilateral) w/o sciatica   2. 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 Osteoarthritis of lumbar spine   4. Spondylosis without myelopathy or radiculopathy, lumbosacral region   5. Lumbar facet joint pain   6. Lumbar facet arthropathy (L3-4, L4-5, and L5-S1) (Bilateral)   7. 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 Lumbar facet syndrome (Bilateral)   9. 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 Piriformis muscle pain (Right)   11. Postop check    Controlled Controlled Controlled   Updated Problems: No problems updated.  Plan of Care  Problem-specific:  No problem-specific Assessment & Plan notes found for this encounter.  Kara [name] has a current medication list which includes the following long-term medication(s): calcium carbonate, candesartan, glipizide, ropinirole, and warfarin.  Pharmacotherapy (Medications Ordered): No orders of the defined types were placed in this encounter.  Orders:  Orders Placed This Encounter  Procedures   Nursing Instructions:    Please complete this patient's postprocedure evaluation.    Scheduling Instructions:     Please complete this patient's postprocedure evaluation.   Follow-up plan:   No follow-ups on file.      Interventional Therapies  Risk  Complexity Considerations:   ANTICOAGULATION: Coumadin (Stop: 5 days  Re-start: 2hrs)  Hx DVT & PE ALLERGY: Latex Morbid obesity (class III) (BMI>40)  metabolic syndrome  NIDDM  HTN Difficult Lumbar RFA due to morbid obesity    Planned  Pending:    Under consideration:   NO PRN procedures without F2F exam   Completed:   Palliative left lumbar facet (L2-S1) MBB x19 (10/12/2022) (100/100/0/100/100)   Palliative right lumbar facet (L2-S1) MBB x26 (10/12/2022) (100/100/0/100/100)  Therapeutic bilateral lumbar facet MBB (L3-S1) x1 (04/06/2022) (80/80/90/90)  Therapeutic left lumbar facet RFA x1 (07/21/2021) (100/100/100 x1 week/80) (Lasted 9 months)  Therapeutic right lumbar facet RFA x1 (07/07/2021) (100/100/80/80) (Lasted 9 months)   Diagnostic right piriformis muscle inj. #1 (no steroids) (11/09/2022)   Diagnostic right IA hip inj. x2 (01/28/2022) (100/100/85/85)  Diagnostic left IA hip inj. x1 (01/28/2022) (100/100/85/85)  Diagnostic right trochanteric bursa inj. x1 (09/15/2021) (85/85/85)  Diagnostic left trochanteric bursa inj. x1 (01/28/2022) (100/100/85/85)   Diagnostic left L4-5 LESI x1 (11/10/2017) (100/80/0/0-25)  Diagnostic right L4 TFESI x1 (11/19/2021) (90/80/0/0) (fell at [employer])  Therapeutic (Midline) caudal ESI x3 (  06/08/2018) (100/100/100 x 2-3 days/0)   Palliative right SI Blk x9 (01/28/2022) (100/100/80/80)  Diagnostic left SI Blk x2 (01/28/2022) (100/100/80/80)     Therapeutic  Palliative (PRN) options:   NO PRN procedures without F2F exam by Dr. Laban Emperor due to multifactorial etiology of chronic pain.   Pharmacotherapy  Nonopioids transferred 03/27/2020: Calcium       Recent Visits Date Type Provider Dept  11/09/22 Procedure visit Delano Metz, MD [name]  11/01/22 Office Visit Delano Metz, MD [name]  10/12/22 Procedure visit Edward Jolly, MD [name]  09/29/22 Office Visit Delano Metz, MD [name]  Showing recent visits within past 90 days and meeting all other requirements Today's Visits Date Type Provider Dept  12/07/22 Procedure visit Delano Metz, MD [name]  Showing today's visits and meeting all other requirements Future Appointments No visits were found meeting these conditions. Showing future appointments within next 90 days and meeting all other  requirements  I discussed the assessment and treatment plan with the patient. The patient was provided an opportunity to ask questions and all were answered. The patient agreed with the plan and demonstrated an understanding of the instructions.  Patient advised to call back or seek an in-person evaluation if the symptoms or condition worsens.  Duration of encounter: 15 minutes.  Total time on encounter, as per AMA guidelines included both the face-to-face and non-face-to-face time personally spent by the physician and/or other qualified health care professional(s) on the day of the encounter (includes time in activities that require the physician or other qualified health care professional and does not include time in activities normally performed by clinical staff). Physician's time may include the following activities when performed: Preparing to see the patient (e.g., pre-charting review of records, searching for previously ordered imaging, lab work, and nerve conduction tests) Review of prior analgesic pharmacotherapies. Reviewing PMP Interpreting ordered tests (e.g., lab work, imaging, nerve conduction tests) Performing post-procedure evaluations, including interpretation of diagnostic procedures Obtaining and/or reviewing separately obtained history Performing a medically appropriate examination and/or evaluation Counseling and educating the patient/family/caregiver Ordering medications, tests, or procedures Referring and communicating with other health care professionals (when not separately reported) Documenting clinical information in the electronic or other health record Independently interpreting results (not separately reported) and communicating results to the patient/ family/caregiver Care coordination (not separately reported)  Note by: Oswaldo Done, MD Date: 12/07/2022; Time: 9:02 AM

## 2022-12-07 NOTE — Patient Instructions (Signed)
____________________________________________________________________________________________  Post-Procedure Discharge Instructions  Instructions: Apply ice:  Purpose: This will minimize any swelling and discomfort after procedure.  When: Day of procedure, as soon as you get home. How: Fill a plastic sandwich bag with crushed ice. Cover it with a small towel and apply to injection site. How long: (15 min on, 15 min off) Apply for 15 minutes then remove x 15 minutes.  Repeat sequence on day of procedure, until you go to bed. Apply heat:  Purpose: To treat any soreness and discomfort from the procedure. When: Starting the next day after the procedure. How: Apply heat to procedure site starting the day following the procedure. How long: May continue to repeat daily, until discomfort goes away. Food intake: Start with clear liquids (like water) and advance to regular food, as tolerated.  Physical activities: Keep activities to a minimum for the first 8 hours after the procedure. After that, then as tolerated. Driving: If you have received any sedation, be responsible and do not drive. You are not allowed to drive for 24 hours after having sedation. Blood thinner: (Applies only to those taking blood thinners) You may restart your blood thinner 6 hours after your procedure. Insulin: (Applies only to Diabetic patients taking insulin) As soon as you can eat, you may resume your normal dosing schedule. Infection prevention: Keep procedure site clean and dry. Shower daily and clean area with soap and water. Post-procedure Pain Diary: Extremely important that this be done correctly and accurately. Recorded information will be used to determine the next step in treatment. For the purpose of accuracy, follow these rules: Evaluate only the area treated. Do not report or include pain from an untreated area. For the purpose of this evaluation, ignore all other areas of pain, except for the treated  area. After your procedure, avoid taking a long nap and attempting to complete the pain diary after you wake up. Instead, set your alarm clock to go off every hour, on the hour, for the initial 8 hours after the procedure. Document the duration of the numbing medicine, and the relief you are getting from it. Do not go to sleep and attempt to complete it later. It will not be accurate. If you received sedation, it is likely that you were given a medication that may cause amnesia. Because of this, completing the diary at a later time may cause the information to be inaccurate. This information is needed to plan your care. Follow-up appointment: Keep your post-procedure follow-up evaluation appointment after the procedure (usually 2 weeks for most procedures, 6 weeks for radiofrequencies). DO NOT FORGET to bring you pain diary with you.   Expect: (What should I expect to see with my procedure?) From numbing medicine (AKA: Local Anesthetics): Numbness or decrease in pain. You may also experience some weakness, which if present, could last for the duration of the local anesthetic. Onset: Full effect within 15 minutes of injected. Duration: It will depend on the type of local anesthetic used. On the average, 1 to 8 hours.  From steroids (Applies only if steroids were used): Decrease in swelling or inflammation. Once inflammation is improved, relief of the pain will follow. Onset of benefits: Depends on the amount of swelling present. The more swelling, the longer it will take for the benefits to be seen. In some cases, up to 10 days. Duration: Steroids will stay in the system x 2 weeks. Duration of benefits will depend on multiple posibilities including persistent irritating factors. Side-effects: If present, they  may typically last 2 weeks (the duration of the steroids). Frequent: Cramps (if they occur, drink Gatorade and take over-the-counter Magnesium 450-500 mg once to twice a day); water retention with  temporary weight gain; increases in blood sugar; decreased immune system response; increased appetite. Occasional: Facial flushing (red, warm cheeks); mood swings; menstrual changes. Uncommon: Long-term decrease or suppression of natural hormones; bone thinning. (These are more common with higher doses or more frequent use. This is why we prefer that our patients avoid having any injection therapies in other practices.)  Very Rare: Severe mood changes; psychosis; aseptic necrosis. From procedure: Some discomfort is to be expected once the numbing medicine wears off. This should be minimal if ice and heat are applied as instructed.  Call if: (When should I call?) You experience numbness and weakness that gets worse with time, as opposed to wearing off. New onset bowel or bladder incontinence. (Applies only to procedures done in the spine)  Emergency Numbers: Durning business hours (Monday - Thursday, 8:00 AM - 4:00 PM) (Friday, 9:00 AM - 12:00 Noon): [phone] After hours: [phone] NOTE: If you are having a problem and are unable connect with, or to talk to a provider, then go to your nearest urgent care or emergency department. If the problem is serious and urgent, please call 911. ____________________________________________________________________________________________  TENS (Device can be purchased "online", without prescription. Search: "TENS 7000".) Transcutaneous electrical nerve stimulation (TENS) is a method of pain relief involving the use of a mild electrical current. A TENS machine is a small, battery-operated device that has leads connected to sticky pads called electrodes.   Rechargeable 9V batteries:     Electrode placement:   TENS UNIT SAFETY WARNING SHEET and INFORMATION INDICATIONS AND CONTRAINDICTIONS Read the operation manual before using the device. Freight forwarder ([place]) restricts this device to sale by or on the order of a physician. Observe your physician's  precise instructions and let him show you where to apply the electrodes. For a successful therapy, the correct application of the electrodes is an important factor. Carefully write down the settings your physician recommended. Indications for use This device is a prescription device and only for symptomatic relief of chronic intractable pain. Contraindications:   Any electrode placement that applies current to the carotid sinus (neck) region.   Patients with implanted electronic devices (for example, a pacemaker) or metallic implants should not undertake.   Any electrode placement that causes current to flow transcerebrally (through the head). The use of unit whenever pain symptoms are undiagnosed, unit etiology is determined.   The use of TENS whenever pain syndromes are undiagnosed, until etiology is established.  WARNINGS AND PRECAUTIONS  Warnings:   The device must be kept out of reach of children.   The safety of device for use during pregnancy or delivery has not been established.   Do not place electrodes on front of the throat. This may result in spasms of the laryngeal and pharyngeal muscles.   Do not place the electrodes over the carotid nerve (side of neck below ear).   The device is not effective for pain of central origin (headaches).   The device may interfere with electronic monitoring equipment (such as ECG monitors and ECG alarms).   Electrodes should not be placed over the eyes, in the mouth, or internally.   These devices have no curative value.   TENS devices should be used only under the continued supervision of a physician.   TENS is a symptomatic treatment  and as such suppresses the sensation of pain which would otherwise serve as a protective mechanism. Precautions/Adverse Reactions   Isolated cases of skin irritation may occur at the site of electrode placement following long-term application.   Stimulation should be stopped and electrodes removed until the cause of the irritation can be  determined.   Effectiveness is highly dependent upon patient selection by a person qualified in the management of pain patients.   If the device treatment becomes ineffective or unpleasant, stimulation should be discontinued until reevaluation by a physician/clinician.   Always turn the device off before applying or removing electrodes.   Skin irritation and electrode burns are potential adverse reactions.  PURPOSE: A Transcutaneous Electrical Nerve Stimulator, or TENS, unit is designed to relieve post-operative, acute and chronic pain. It is used for pain caused by peripheral nerves and not central. TENS units are prescription-only devices.  OPERATION: TENS units work in a couple of ways. The first way they are thought to work is by a method called the [employer]. The [employer] states that our brains can only handle one stimulus at a time. When you have chronic pain, this pain signal is constantly being sent to your brain and recognized as pain. When an electrical stimulus is added to the area of pain the body feels this electrical stimulus, and since the brain can only handle one thing at a time, the pain is not transmitted to the brain. The second method thought to be part of TENS unit's success is by way of stimulating our own bodies to release their own natural painkillers. TENS units do not work for everyone and results may vary. Always follow the instructions and warnings in your user's manual.  USE: One of the most important tasks that must be performed is battery maintenance. If you are using a Engineering geologist, always fully charge it and fully deplete it before charging it again. These batteries can develop memories and by not performing this charging task correctly, your battery's life can be greatly diminished. If your battery does develop a memory you can help expand the memory by charging for 12 - 13 hours and then completely depleting the battery. Always prepare the skin  before applying electrodes. Your skin should be clean and free of any lotions or creams. If you are using electrodes that use conductive gel, apply a small, even layer over the electrode. For carbon, self-adhesive electrodes, apply a drop of water to the electrodes before applying to the skin. The electrodes attach to the lead wires and then the TENS unit. Always grasp the connector and not the cord when inserting or removing. When making adjustments, always make sure the unit's channels (1 and 2) are in the OFF position. The actual settings should be recommended and prescribed by your physician. Medical equipment suppliers don'tset or instruct users as to user settings. When you are using the BURST mode, the unit delivers a series of quick pulses followed by a rest. This cycle repeats itself frequently. Always have channels OFF before changing modes.  For MODULATION mode, the stimulation automatically varies the width of the pulse.  For CONVENTIONAL mode, the stimulation is constant. After the settings have been fine-tuned, set the timer to 30 or 60 minutes. Your physician should also prescribe the use time. When the lights become dim, it means your batteries should be replaced or recharged.  ACCESSORIES: The electrodes and lead wires can be obtained from your medical equipment supplier. Your medical equipment supplier  can set up a recurring delivery to accommodate your needs. Electrodes should be replaced once a month and lead wires once every 6 months.  Video Tutorial https://youtu.be/[email or web address]   _________________________________________________________________________________________  Body mass index (BMI) Weight Management Required  URGENT: Dear Ms. Walrath your weight has been found to be adversely affecting your health. Aggressive action is immediately required. Talk to your primary care physician.  Body mass index (BMI) is a common tool for deciding whether a person has  an appropriate body weight.  It measures a persons weight in relation to their height.   According to the [facility] of health (NIH): A BMI of less than 18.5 means that a person is underweight. A BMI of between 18.5 and 24.9 is ideal. A BMI of between 25 and 29.9 is overweight. A BMI over 30 indicates obesity.  Body Mass Index (BMI) Classification BMI level (kg/m2) Category Associated incidence of chronic pain  <18  Underweight   18.5-24.9 Ideal body weight   25-29.9 Overweight  20%  30-34.9 Obese (Class I)  68%  35-39.9 Severe obesity (Class II)  136%  >40 Extreme obesity (Class III)  254%   Your current Estimated body mass index is 35.96 kg/m as calculated from the following:   Height as of this encounter: 5\' 3"  (1.6 m).   Weight as of this encounter: 203 lb (92.1 kg).  Morbidly Obese Classification: You will be considered to be "Morbidly Obese" if your BMI is above 30 and you have one or more of the following conditions caused or associated to obesity: 1.    Type 2 Diabetes (Leading to cardiovascular diseases (CVD), stroke, peripheral vascular diseases (PVD), retinopathy, nephropathy, and neuropathy) 2.    Cardiovascular Disease (High Blood Pressure; Congestive Heart Failure; High Cholesterol; Coronary Artery Disease; Angina; Arrhythmias, Dysrhythmias, or Heart Attacks) 3.    Breathing problems (Asthma; obesity-hypoventilation syndrome; obstructive sleep apnea; chronic inflammatory airway disease; reactive airway disease; or shortness of breath) 4.    Chronic kidney disease 5.    Liver disease (nonalcoholic fatty liver disease) 6.    High blood pressure 7.    Acid reflux (gastroesophageal reflux disease; heartburn) 8.    Osteoarthritis (OA) (affecting the hip(s), the knee(s) and/or the lower back) 9.    Low back pain (Lumbar Facet Syndrome; and/or Degenerative Disc Disease) 10.  Hip pain (Osteoarthritis of hip) (For every 1 lbs of added body weight, there is a 2 lbs increase  in pressure inside of each hip articulation. 1:2 mechanical relationship) 11.  Knee pain (Osteoarthritis of knee) (For every 1 lbs of added body weight, there is a 4 lbs increase in pressure inside of each knee articulation. 1:4 mechanical relationship) (patients with a BMI>30 kg/m2 were 6.8 times more likely to develop knee OA than normal-weight individuals) 12.  Cancer: Epidemiological studies have shown that obesity is a risk factor for: post-menopausal breast cancer; cancers of the endometrium, colon and kidney cancer; malignant adenomas of the oesophagus. Obese subjects have an approximately 1.5-3.5-fold increased risk of developing these cancers compared with normal-weight subjects, and it has been estimated that between 15 and 45% of these cancers can be attributed to overweight. More recent studies suggest that obesity may also increase the risk of other types of cancer, including pancreatic, hepatic and gallbladder cancer. (Ref: Obesity and cancer. Pischon T, Nthlings U, [employer] H. Proc Nutr Soc. 2008 May;67(2):128-45. doi: 10.1017/S0029665108006976.) The International Agency for Research on Cancer (IARC) has identified 13 cancers associated with overweight and obesity: meningioma, multiple  myeloma, adenocarcinoma of the esophagus, and cancers of the thyroid, postmenopausal breast cancer, gallbladder, stomach, liver, pancreas, kidney, ovaries, uterus, colon and rectal (colorectal) cancers. 55 percent of all cancers diagnosed in women and 24 percent of those diagnosed in men are associated with overweight and obesity.  Recommendation: At this point it is urgent that you take a step back and concentrate in loosing weight. Dedicate 100% of your efforts on this task. Nothing else will improve your health more than bringing your weight down and your BMI to less than 30. If you are here, you probably have chronic pain. We know that most chronic pain patients have difficulty exercising secondary to their pain.  For this reason, you must rely on proper nutrition and diet in order to lose the weight. If your BMI is above 40, you should seriously consider bariatric surgery. A realistic goal is to lose 10% of your body weight over a period of 12 months.  Be honest to yourself, if over time you have unsuccessfully tried to lose weight, then it is time for you to seek professional help and to enter a medically supervised weight management program, and/or undergo bariatric surgery. Stop procrastinating.   Pain management considerations:  1.    Pharmacological Problems: Be advised that the use of opioid analgesics (oxycodone; hydrocodone; morphine; methadone; codeine; and all of their derivatives) have been associated with decreased metabolism and weight gain.  For this reason, should we see that you are unable to lose weight while taking these medications, it may become necessary for [place] to taper down and indefinitely discontinue them.  2.    Technical Problems: The incidence of successful interventional therapies decreases as the patient's BMI increases. It is much more difficult to accomplish a safe and effective interventional therapy on a patient with a BMI above 35. 3.    Radiation Exposure Problems: The x-rays machine, used to accomplish injection therapies, will automatically increase their x-ray output in order to capture an appropriate bone image. This means that radiation exposure increases exponentially with the patient's BMI. (The higher the BMI, the higher the radiation exposure.) Although the level of radiation used at a given time is still safe to the patient, it is not for the physician and/or assisting staff. Unfortunately, radiation exposure is accumulative. Because physicians and the staff have to do procedures and be exposed on a daily basis, this can result in health problems such as cancer and radiation burns. Radiation exposure to the staff is monitored by the radiation batches that they wear. The  exposure levels are reported back to the staff on a quarterly basis. Depending on levels of exposure, physicians and staff may be obligated by law to decrease this exposure. This means that they have the right and obligation to refuse providing therapies where they may be overexposed to radiation. For this reason, physicians may decline to offer therapies such as radiofrequency ablation or implants to patients with a BMI above 40. 4.    Current Trends: Be advised that the current trend is to no longer offer certain therapies to patients with a BMI equal to, or above 35, due to increase perioperative risks, increased technical procedural difficulties, and excessive radiation exposure to healthcare personnel.  _________________________________________________________________________________________

## 2022-12-08 ENCOUNTER — Telehealth: Payer: Self-pay

## 2022-12-08 NOTE — Telephone Encounter (Signed)
Transition Care Management Follow-up Telephone Call Date of discharge and from where: [place] 7/11 How have you been since you were released from the hospital? Doing better Any questions or concerns? No  Items Reviewed: Did the pt receive and understand the discharge instructions provided? Yes  Medications obtained and verified? Yes  Other? No  Any new allergies since your discharge? No  Dietary orders reviewed? No Do you have support at home? Yes     Follow up appointments reviewed:  PCP Hospital f/u appt confirmed? Yes  Scheduled to see  on Same Day 7/11 @ . Specialist Hospital f/u appt confirmed? No  Scheduled to see  on  @ . Are transportation arrangements needed? No  If their condition worsens, is the pt aware to call PCP or go to the Emergency Dept.? Yes Was the patient provided with contact information for the PCP's office or ED? Yes Was to pt encouraged to call back with questions or concerns? Yes

## 2022-12-08 NOTE — Telephone Encounter (Signed)
Post procedure follow up.  Patient states she is doing good.  

## 2022-12-16 DIAGNOSIS — Z7901 Long term (current) use of anticoagulants: Secondary | ICD-10-CM | POA: Diagnosis not present

## 2022-12-16 DIAGNOSIS — Z86711 Personal history of pulmonary embolism: Secondary | ICD-10-CM | POA: Diagnosis not present

## 2022-12-16 DIAGNOSIS — R791 Abnormal coagulation profile: Secondary | ICD-10-CM | POA: Diagnosis not present

## 2022-12-20 ENCOUNTER — Telehealth: Payer: Self-pay | Admitting: Oncology

## 2022-12-20 NOTE — Telephone Encounter (Signed)
Pt called and stated that she needed to reschedule her CT scan that was on Wednesday due to her husband having surgery. I transferred her to centralized scheduling and told her we would keep a look out for the new appt and reschedule Dr.Raos appt accordingly.

## 2022-12-22 ENCOUNTER — Ambulatory Visit: Payer: PPO

## 2022-12-27 ENCOUNTER — Ambulatory Visit: Payer: PPO | Admitting: Oncology

## 2022-12-27 NOTE — Progress Notes (Unsigned)
PROVIDER NOTE: Information contained herein reflects review and annotations entered in association with encounter. Interpretation of such information and data should be left to medically-trained personnel. Information provided to patient can be located elsewhere in the medical record under "Patient Instructions". Document created using STT-dictation technology, any transcriptional errors that may result from process are unintentional.    Patient: Kara [name]  Service Category: E/M  Provider: Oswaldo Done, MD  DOB: 08-22-1950  DOS: 12/28/2022  Referring Provider: Barbette Reichmann, MD  MRN: [id number]  Specialty: Interventional Pain Management  PCP: Kara Reichmann, MD  Type: Established Patient  Setting: Ambulatory outpatient    Location: Office  Delivery: Face-to-face     HPI  [name], a 72 y.o. year old female, is here today because of her Chronic bilateral low back pain without sciatica [M54.50, G89.29]. [name] primary complain today is No chief complaint on file.  Pertinent problems: 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); Lumbar pars defect (L3 and L4) (Bilateral); Diabetic peripheral neuropathy (HCC); Chronic lower extremity pain (2ry area of Pain) (Bilateral); Chronic radicular pain of lower extremity; Osteoarthritis of hip (Right); Chronic low back pain (1ry area of Pain) (Bilateral) w/o sciatica; Spondylosis  without myelopathy or radiculopathy, lumbosacral region; Other specified dorsopathies, sacral and sacrococcygeal region; Secondary osteoarthritis of multiple sites; Sacral insufficiency fracture; DDD (degenerative disc disease), lumbosacral; Coccygodynia; Chronic sacroiliac joint pain (Left); Osteoarthritis of lumbar spine; Spondylolisthesis, lumbosacral region; Abnormal MRI, lumbar spine (08/09/2019); Spasm of muscle of lower back; Chronic hip pain (Bilateral) (R>L); Osteoarthritis of hips (Bilateral); Greater trochanteric bursitis (Right); Chronic hip pain (Right); Chronic low back pain (Right) w/o sciatica; Greater trochanteric bursitis (Left); Other bursitis of hip, not elsewhere classified (gluteus medius bursa) (Left); Lumbar facet joint pain; and Piriformis muscle pain (Right) on their pertinent problem list. Pain Assessment: Severity of   is reported as a  /10. Location:    / . Onset:  . Quality:  . Timing:  . Modifying factor(s):  Marland Kitchen Vitals:  vitals were not taken for this visit.  BMI: Estimated body mass index is 35.96 kg/m as calculated from the following:   Height as of 12/07/22: 5\' 3"  (1.6 m).   Weight as of 12/07/22: 203 lb (92.1 kg). Last encounter: 12/02/2022. Last procedure: 12/07/2022.  Reason for encounter: post-procedure evaluation and assessment. ***  Post-procedure evaluation   Procedure: Lumbar Facet, Medial Branch Block(s)  [id number]   Laterality: Bilateral  Level: L2, L3, L4, L5, and S1 Medial Branch Level(s). Injecting these levels blocks the L3-4, L4-5, and L5-S1 lumbar facet joints. Imaging: Fluoroscopic guidance         Anesthesia: Local anesthesia (1-2% Lidocaine) Anxiolysis: IV Versed         Sedation: No Sedation                       DOS: 12/07/2022 Performed by: Oswaldo Done, MD  Primary Purpose: Diagnostic/Therapeutic Indications: Low back pain severe enough to impact quality of life or function. 1. Chronic low back pain (1ry area of Pain) (  Bilateral) w/o  sciatica   2. 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 Osteoarthritis of lumbar spine   4. Spondylosis without myelopathy or radiculopathy, lumbosacral region   5. Lumbar facet joint pain   6. Lumbar facet arthropathy (L3-4, L4-5, and L5-S1) (Bilateral)   7. 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 Lumbar facet syndrome (Bilateral)   9. 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 Subsequent 4-6 hours post-procedure:   ***. Analgesia past initial 6 hours:   ***. Ongoing improvement:  Analgesic:  *** Function:    ***    ROM:    ***     Pharmacotherapy Assessment  Analgesic: No chronic opioid analgesics therapy prescribed by our practice, 2ry to Medication Agreement Violation. Oxycodone IR 10 mg, 1 tab PO qd,  from PCP.  (Getting oxycodone from PCP in multiple occasions, according to PMP)(11/21/20; 12/26/20; 01/16/21; 02/13/21). Opioid analgesic pharmacotherapy D/C'ed on 03/03/2021. MME: 20 mg/day.   Monitoring: Kara [name] PMP: PDMP reviewed during this encounter.       Pharmacotherapy: No side-effects or adverse reactions reported. Compliance: No problems identified. Effectiveness: Clinically acceptable.  No notes on file  No results found for: "CBDTHCR" No results found for: "D8THCCBX" No results found for: "D9THCCBX"  UDS:  Summary  Date Value Ref Range Status  11/12/2020 Note  Final    Comment:    ==================================================================== [employer] Select 13 (MW) ==================================================================== Test                             Result       Flag       Units  Drug Present and Declared for Prescription Verification   Norhydrocodone                 61           EXPECTED   ng/mg creat    Norhydrocodone is an expected metabolite  of hydrocodone.    Tramadol                       >4202        EXPECTED   ng/mg creat   O-Desmethyltramadol            >4202        EXPECTED   ng/mg creat   N-Desmethyltramadol            1712         EXPECTED   ng/mg creat    Source of tramadol is a prescription medication. O-desmethyltramadol    and N-desmethyltramadol are expected metabolites of tramadol.  Drug Absent but Declared for Prescription Verification   Hydrocodone                    Not Detected UNEXPECTED ng/mg creat    Hydrocodone is almost always present in patients taking this drug    consistently. Absence of hydrocodone could be due to lapse of time    since the last dose or unusual pharmacokinetics (rapid metabolism).  ==================================================================== Test                      Result    Flag   Units  Ref Range   Creatinine              119              mg/dL      >=60 ==================================================================== Declared Medications:  The flagging and interpretation on this report are based on the  following declared medications.  Unexpected results may arise from  inaccuracies in the declared medications.   **Note: The testing scope of this panel includes these medications:   Hydrocodone  Tramadol   **Note: The testing scope of this panel does not include the  following reported medications:   Acetaminophen  Alendronate (Fosamax)  Aspirin  Calcium  Candesartan  Cholecalciferol  Cyanocobalamin  Cyclobenzaprine  Dulaglutide  Gabapentin  Glipizide  Meclizine  Multivitamin  Nystatin  Ondansetron  Ropinirole  Tolterodine  Warfarin ==================================================================== For clinical consultation, please call [phone]. ====================================================================       ROS  Constitutional: Denies any fever or chills Gastrointestinal: No reported hemesis, hematochezia,  vomiting, or acute GI distress Musculoskeletal: Denies any acute onset joint swelling, redness, loss of ROM, or weakness Neurological: No reported episodes of acute onset apraxia, aphasia, dysarthria, agnosia, amnesia, paralysis, loss of coordination, or loss of consciousness  Medication Review  Dulaglutide, HYDROcodone-acetaminophen, Magnesium Oxide -Mg Supplement, Vitamin B-12, calcium carbonate, candesartan, cholecalciferol, citalopram, cyclobenzaprine, gabapentin, glipiZIDE, meclizine, oxybutynin, rOPINIRole, and warfarin  History Review  Allergy: Kara [name] is allergic to adhesive [tape], other, latex, and morphine and codeine. Drug: Kara [name]  reports no history of drug use. Alcohol:  reports no history of alcohol use. Tobacco:  reports that she has never smoked. She has never used smokeless tobacco. Social: Kara [name]  reports that she has never smoked. She has never used smokeless tobacco. She reports that she does not drink alcohol and does not use drugs. Medical:  has a past medical history of Anginal pain (HCC), Arthritis, CHF (congestive heart failure) (HCC), Diabetes (HCC), Diverticulitis (2018), History of being hospitalized, History of hiatal hernia, Hypertension, Osteoporosis, PE (pulmonary thromboembolism) (HCC), 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HCC), and Vertigo. Surgical: Kara [name]  has a past surgical history that includes Knee surgery; Carpal tunnel release (Bilateral); Colon resection sigmoid (N/A, 11/02/2016); Colostomy (N/A, 11/02/2016); Sigmoidoscopy (N/A, 11/13/2016); PULMONARY VENOGRAPHY (N/A, 11/19/2016); Colonoscopy with propofol (N/A, 02/03/2017); Colon surgery (11/02/2016); IVC FILTER INSERTION (Right, 10/2016); Colostomy reversal (N/A, 02/24/2017); Appendectomy (N/A, 02/24/2017); Lysis of adhesion (N/A, 02/24/2017); laparoscopy (N/A, 02/24/2017); Ileo loop diversion (N/A, 02/24/2017); Sigmoidoscopy (N/A, 05/19/2017); Ileostomy closure (N/A, 06/01/2017); IVC FILTER REMOVAL  (N/A, 08/09/2017); Sacroplasty (N/A, 11/08/2017); Cataract extraction w/PHACO (Left, 11/21/2018); Cataract extraction w/PHACO (Right, 12/12/2018); and Video bronchoscopy with endobronchial navigation (N/A, 02/23/2021). Family: family history includes Breast cancer in her mother; Cancer in her mother; Diabetes in her mother and sister; Heart disease in her father, mother, and sister.  Laboratory Chemistry Profile   Renal Lab Results  Component Value Date   BUN 13 12/02/2022   CREATININE 0.71 12/02/2022   GFRAA >60 07/09/2019   GFRNONAA >60 12/02/2022    Hepatic Lab Results  Component Value Date   AST 32 12/02/2022   ALT 30 12/02/2022   ALBUMIN 4.4 12/02/2022   ALKPHOS 47 12/02/2022   LIPASE 26 12/02/2022    Electrolytes Lab Results  Component Value Date   NA 138 12/02/2022   K 3.8 12/02/2022   CL 106 12/02/2022   CALCIUM 9.3 12/02/2022   MG 1.7 08/14/2020   PHOS 3.3 11/07/2016    Bone Lab Results  Component Value Date  25OHVITD1 25 (L) 11/02/2017   25OHVITD2 <1.0 11/02/2017   25OHVITD3 25 11/02/2017    Inflammation (CRP: Acute Phase) (ESR: Chronic Phase) Lab Results  Component Value Date   CRP 1.7 (H) 08/15/2020   ESRSEDRATE 12 11/02/2017   LATICACIDVEN 1.7 11/21/2021         Note: Above Lab results reviewed.  Recent Imaging Review  DG PAIN CLINIC C-ARM 1-60 MIN NO REPORT Fluoro was used, but no Radiologist interpretation will be provided.  Please refer to "NOTES" tab for provider progress note. Note: Reviewed        Physical Exam  General appearance: Well nourished, well developed, and well hydrated. In no apparent acute distress Mental status: Alert, oriented x 3 (person, place, & time)       Respiratory: No evidence of acute respiratory distress Eyes: PERLA Vitals: There were no vitals taken for this visit. BMI: Estimated body mass index is 35.96 kg/m as calculated from the following:   Height as of 12/07/22: 5\' 3"  (1.6 m).   Weight as of 12/07/22: 203 lb  (92.1 kg). Ideal: Patient weight not recorded  Assessment   Diagnosis Status  1. Chronic low back pain (1ry area of Pain) (Bilateral) w/o sciatica   2. Lumbar facet joint pain   3. Lumbar facet syndrome (Bilateral)   4. Postop check    Controlled Controlled Controlled   Updated Problems: No problems updated.  Plan of Care  Problem-specific:  No problem-specific Assessment & Plan notes found for this encounter.  Kara [name] has a current medication list which includes the following long-term medication(s): calcium carbonate, candesartan, citalopram, glipizide, ropinirole, and warfarin.  Pharmacotherapy (Medications Ordered): No orders of the defined types were placed in this encounter.  Orders:  No orders of the defined types were placed in this encounter.  Follow-up plan:   No follow-ups on file.      Interventional Therapies  Risk  Complexity Considerations:   ANTICOAGULATION: Coumadin (Stop: 5 days  Re-start: 2hrs)  Hx DVT & PE ALLERGY: Latex Morbid obesity (class III) (BMI>40)  metabolic syndrome  NIDDM  HTN Difficult Lumbar RFA due to morbid obesity    Planned  Pending: Diagnostic/therapeutic right piriformis muscle injection #1 (no steroids)    Under consideration:   Therapeutic bilateral lumbar facet RFA #2 (patient indicates preferring to have only the block without radiofrequency.)   Completed:   Therapeutic bilateral lumbar facet MBB (L3-S1) x1 (04/06/2022) (80/80/90/90)  Diagnostic/therapeutic right IA hip inj. x2 (01/28/2022) (100/100/85/85)  Diagnostic/therapeutic left IA hip inj. x1 (01/28/2022) (100/100/85/85)  Diagnostic/therapeutic right trochanteric bursa inj. x1 (09/15/2021) (85/85/85)  Diagnostic/therapeutic left trochanteric bursa inj. x1 (01/28/2022) (100/100/85/85)  Therapeutic left lumbar facet RFA x1 (07/21/2021) (100/100/100 x1 week/80) (Lasted 9 months)  Therapeutic right lumbar facet RFA x1 (07/07/2021) (100/100/80/80)  (Lasted 9 months)  Palliative/Therapeutic left L4-5 LESI x1 (11/10/2017) (100/80/0/0-25)  Diagnostic/therapeutic right L4 TFESI x1 (11/19/2021) (90/80/0/0) (fell at [employer]) Palliative left lumbar facet (L2-S1) MBB x19 (10/12/2022) (100/100/0/100/100)  Palliative right lumbar facet (L2-S1) MBB x26 (10/12/2022) (100/100/0/100/100)  Palliative right SI joint block x9 (01/28/2022) (100/100/80/80)  Diagnostic left SI joint block x2 (01/28/2022) (100/100/80/80)  Palliative/Therapeutic (Midline) caudal ESI x3 (06/08/2018) (100/100/100 x 2-3 days/0)    Therapeutic  Palliative (PRN) options:   NO PRN procedures without F2F exam by Dr. Laban Emperor due to multifactorial etiology of chronic pain. No more procedures until after 08/14/2022.    Pharmacotherapy  Nonopioids transferred 03/27/2020: Calcium  Recent Visits Date Type Provider Dept  12/07/22 Procedure visit Delano Metz, MD [name]  11/09/22 Procedure visit Delano Metz, MD [name]  11/01/22 Office Visit Delano Metz, MD [name]  10/12/22 Procedure visit Edward Jolly, MD [name]  09/29/22 Office Visit Delano Metz, MD [name]  Showing recent visits within past 90 days and meeting all other requirements Future Appointments Date Type Provider Dept  12/28/22 Appointment Delano Metz, MD [name]  Showing future appointments within next 90 days and meeting all other requirements  I discussed the assessment and treatment plan with the patient. The patient was provided an opportunity to ask questions and all were answered. The patient agreed with the plan and demonstrated an understanding of the instructions.  Patient advised to call back or seek an in-person evaluation if the symptoms or condition worsens.  Duration of encounter: *** minutes.  Total time on encounter, as per AMA guidelines included both the face-to-face and  non-face-to-face time personally spent by the physician and/or other qualified health care professional(s) on the day of the encounter (includes time in activities that require the physician or other qualified health care professional and does not include time in activities normally performed by clinical staff). Physician's time may include the following activities when performed: Preparing to see the patient (e.g., pre-charting review of records, searching for previously ordered imaging, lab work, and nerve conduction tests) Review of prior analgesic pharmacotherapies. Reviewing PMP Interpreting ordered tests (e.g., lab work, imaging, nerve conduction tests) Performing post-procedure evaluations, including interpretation of diagnostic procedures Obtaining and/or reviewing separately obtained history Performing a medically appropriate examination and/or evaluation Counseling and educating the patient/family/caregiver Ordering medications, tests, or procedures Referring and communicating with other health care professionals (when not separately reported) Documenting clinical information in the electronic or other health record Independently interpreting results (not separately reported) and communicating results to the patient/ family/caregiver Care coordination (not separately reported)  Note by: Oswaldo Done, MD Date: 12/28/2022; Time: 8:20 PM

## 2022-12-28 ENCOUNTER — Ambulatory Visit: Payer: PPO | Attending: Pain Medicine | Admitting: Pain Medicine

## 2022-12-28 ENCOUNTER — Encounter: Payer: Self-pay | Admitting: Pain Medicine

## 2022-12-28 VITALS — BP 110/72 | HR 78 | Temp 97.7°F | Ht 63.0 in | Wt 203.0 lb

## 2022-12-28 DIAGNOSIS — M545 Low back pain, unspecified: Secondary | ICD-10-CM | POA: Diagnosis not present

## 2022-12-28 DIAGNOSIS — G8929 Other chronic pain: Secondary | ICD-10-CM | POA: Insufficient documentation

## 2022-12-28 DIAGNOSIS — Z86711 Personal history of pulmonary embolism: Secondary | ICD-10-CM | POA: Diagnosis not present

## 2022-12-28 DIAGNOSIS — M5459 Other low back pain: Secondary | ICD-10-CM | POA: Diagnosis not present

## 2022-12-28 DIAGNOSIS — Z09 Encounter for follow-up examination after completed treatment for conditions other than malignant neoplasm: Secondary | ICD-10-CM | POA: Diagnosis not present

## 2022-12-28 DIAGNOSIS — M47816 Spondylosis without myelopathy or radiculopathy, lumbar region: Secondary | ICD-10-CM | POA: Diagnosis not present

## 2022-12-28 DIAGNOSIS — Z7901 Long term (current) use of anticoagulants: Secondary | ICD-10-CM | POA: Diagnosis not present

## 2022-12-28 NOTE — Progress Notes (Unsigned)
Safety precautions to be maintained throughout the outpatient stay will include: orient to surroundings, keep bed in low position, maintain call bell within reach at all times, provide assistance with transfer out of bed and ambulation.  

## 2022-12-28 NOTE — Patient Instructions (Addendum)
OTC Recommendations: Consider taking over-the-counter supplements such as: Turmeric/curcumin*(anti-inflammatory) Glucosamine/chondroitin (triple strength)*(may help prevent loss of articular cartilage) Vitamin D*(may have the ability to suppress release of chemicals associated with inflammation) Moringa*(anti-inflammatory with mild analgesic effects) (*Always use manufacturer's recommended dosage.)     ____________________________________________________________________________________________  Pain Prevention Technique  Definition:   A technique used to minimize the effects of an activity known to cause inflammation or swelling, which in turn leads to an increase in pain.  Purpose: To prevent swelling from occurring. It is based on the fact that it is easier to prevent swelling from happening than it is to get rid of it, once it occurs.  Contraindications: Anyone with allergy or hypersensitivity to the recommended medications. Anyone taking anticoagulants (Blood Thinners) (e.g., Coumadin, Warfarin, Plavix, etc.). Patients in Renal Failure or having chronic kidney disease.  Technique: Before you undertake an activity known to cause pain, or a flare-up of your chronic pain, and before you experience any pain, do the following:  On a full stomach, take 4 (four) over the counter Ibuprofens 200mg  tablets (Motrin), for a total of 800 mg. In addition, take over the counter Magnesium 400 to 500 mg, before doing the activity.  Six (6) hours later, again on a full stomach, repeat the Ibuprofen. That night, take a warm shower and stretch under the running warm water.  This technique may be sufficient to abort the pain and discomfort before it happens. Keep in mind that it takes a lot less medication to prevent swelling than it takes to eliminate it once it occurs.   Last Update: 12/02/2022  ____________________________________________________________________________________________

## 2023-01-12 ENCOUNTER — Ambulatory Visit: Payer: PPO

## 2023-01-12 DIAGNOSIS — Z86711 Personal history of pulmonary embolism: Secondary | ICD-10-CM | POA: Diagnosis not present

## 2023-01-12 DIAGNOSIS — Z7901 Long term (current) use of anticoagulants: Secondary | ICD-10-CM | POA: Diagnosis not present

## 2023-01-14 ENCOUNTER — Ambulatory Visit
Admission: RE | Admit: 2023-01-14 | Discharge: 2023-01-14 | Disposition: A | Discharge status: Home / Self Care | Payer: PPO | Source: Ambulatory Visit | Attending: Oncology | Admitting: Oncology

## 2023-01-14 DIAGNOSIS — C3411 Malignant neoplasm of upper lobe, right bronchus or lung: Secondary | ICD-10-CM | POA: Diagnosis not present

## 2023-01-14 DIAGNOSIS — Z08 Encounter for follow-up examination after completed treatment for malignant neoplasm: Secondary | ICD-10-CM | POA: Diagnosis not present

## 2023-01-14 DIAGNOSIS — Z85118 Personal history of other malignant neoplasm of bronchus and lung: Secondary | ICD-10-CM | POA: Diagnosis not present

## 2023-01-14 DIAGNOSIS — J479 Bronchiectasis, uncomplicated: Secondary | ICD-10-CM | POA: Diagnosis not present

## 2023-01-14 DIAGNOSIS — I700 Atherosclerosis of aorta: Secondary | ICD-10-CM | POA: Diagnosis not present

## 2023-01-21 ENCOUNTER — Encounter: Payer: Self-pay | Admitting: Oncology

## 2023-01-21 ENCOUNTER — Inpatient Hospital Stay: Payer: PPO | Attending: Oncology | Admitting: Oncology

## 2023-01-21 VITALS — BP 123/75 | HR 76 | Temp 97.4°F | Resp 18 | Ht 63.0 in | Wt 205.2 lb

## 2023-01-21 DIAGNOSIS — C3411 Malignant neoplasm of upper lobe, right bronchus or lung: Secondary | ICD-10-CM | POA: Diagnosis not present

## 2023-01-23 NOTE — Progress Notes (Signed)
Hematology/Oncology Consult note [facility]  [phone] [phone]  Patient Care Team: Barbette Reichmann, MD as PCP - General (Internal Medicine) Glory Buff, RN as Oncology Nurse Navigator Creig Hines, MD as Consulting Physician (Oncology)   Name of the patient: Kara [name]  [id number]  April 12, 1951   Date of visit: 01/23/23  Diagnosis- right upper lobe lung cancer s/p SBRT   Chief complaint/ Reason for visit- discuss ct scan results and further management  Heme/Onc history: Patient is a 72 year old female underwent CT chest with contrast in May 2022 to follow-up on lung nodule that was incidentally noted on CT abdomen in March 2022 when she was admitted for COVID.  At that time she was noted to have a right lower lobe 1.3 x 1.1 cm right lower lobe nodule.  CT chest in May 2022 showed right upper lobe nodule measuring 1.4 cm which was also hypermetabolic on PET scan with an SUV of 3.3.  No other enlarged mediastinal or hilar lymph nodes or distant metastatic disease was noted.   CT scan in August 2022 showed mild increase in the size of the right upper lobe lung nodule to 1.8 cm.  No evidence of locoregional adenopathy.she underwent SBRT to in December 2022    Interval history- she feels at her baseline state of health. She has some baseline fatigue but denies any worsening SOB.   ECOG PS- 2 Pain scale- 0  Review of systems- Review of Systems  Constitutional:  Positive for malaise/fatigue. Negative for chills, fever and weight loss.  HENT:  Negative for congestion, ear discharge and nosebleeds.   Eyes:  Negative for blurred vision.  Respiratory:  Negative for cough, hemoptysis, sputum production, shortness of breath and wheezing.   Cardiovascular:  Negative for chest pain, palpitations, orthopnea and claudication.  Gastrointestinal:  Negative for abdominal pain, blood in stool, constipation, diarrhea, heartburn, melena, nausea  and vomiting.  Genitourinary:  Negative for dysuria, flank pain, frequency, hematuria and urgency.  Musculoskeletal:  Negative for back pain, joint pain and myalgias.  Skin:  Negative for rash.  Neurological:  Negative for dizziness, tingling, focal weakness, seizures, weakness and headaches.  Endo/Heme/Allergies:  Does not bruise/bleed easily.  Psychiatric/Behavioral:  Negative for depression and suicidal ideas. The patient does not have insomnia.       Allergies  Allergen Reactions   Adhesive [Tape] Other (See Comments)    Pt reports, when removing tape from skin most tapes pull her skin off. 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, 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   CHF (congestive heart failure) (HCC)    followed colon resection, "wouldn't let me get up"   Diabetes (HCC)    type 2   Diverticulitis 2018   History of being hospitalized    1 month ago for 3 days, vomiting, and kink in upper intestine   History of hiatal hernia    Hypertension    Osteoporosis    PE (pulmonary thromboembolism) (HCC)    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HCC)    Vertigo      Past Surgical History:  Procedure  Laterality Date   APPENDECTOMY N/A 02/24/2017   Procedure: Incidental  APPENDECTOMY;  Surgeon: Ricarda Frame, MD;  Location: [name];  Service: General;  Laterality: N/A;   CARPAL TUNNEL RELEASE Bilateral    CATARACT EXTRACTION W/PHACO Left 11/21/2018   Procedure: CATARACT EXTRACTION PHACO AND INTRAOCULAR LENS PLACEMENT (IOC) LEFT DIABETES;  Surgeon: Galen Manila, MD;  Location[facility];  Service: Ophthalmology;  Laterality: Left;  latex sensitivity Diabetic - oral meds   CATARACT EXTRACTION  W/PHACO Right 12/12/2018   Procedure: CATARACT EXTRACTION PHACO AND INTRAOCULAR LENS PLACEMENT (IOC) RIGHT DIABETES;  Surgeon: Galen Manila, MD;  Location: [facility];  Service: Ophthalmology;  Laterality: Right;  Diabetes-oral med Latex sensitiviy   COLON RESECTION SIGMOID N/A 11/02/2016   Procedure: COLON RESECTION SIGMOID;  Surgeon: Ricarda Frame, MD;  Location: [name];  Service: General;  Laterality: N/A;   COLON SURGERY  11/02/2016   COLONOSCOPY WITH PROPOFOL N/A 02/03/2017   Procedure: COLONOSCOPY WITH PROPOFOL;  Surgeon: Toney Reil, MD;  Location: [facility];  Service: Gastroenterology;  Laterality: N/A;   COLOSTOMY N/A 11/02/2016   Procedure: COLOSTOMY;  Surgeon: Ricarda Frame, MD;  Location: [name];  Service: General;  Laterality: N/A;   COLOSTOMY REVERSAL N/A 02/24/2017   Procedure: COLOSTOMY REVERSAL, ostomy takedown, spleenic flexure mobilization, excision rectal stump/distal sigmoid, anastomosis with suture reinforcement;  Surgeon: Ricarda Frame, MD;  Location: [name];  Service: General;  Laterality: N/A;   ILEO LOOP DIVERSION N/A 02/24/2017   Procedure: ILEO LOOP COLOSTOMY;  Surgeon: Ricarda Frame, MD;  Location: [name];  Service: General;  Laterality: N/A;   ILEOSTOMY CLOSURE N/A 06/01/2017   Procedure: LOOP ILEOSTOMY TAKEDOWN;  Surgeon: Ricarda Frame, MD;  Location: [name];  Service: General;  Laterality: N/A;   IVC FILTER INSERTION Right 10/2016   IVC FILTER REMOVAL N/A 08/09/2017   Procedure: IVC FILTER REMOVAL;  Surgeon: Renford Dills, MD;  Location: [name];  Service: Cardiovascular;  Laterality: N/A;   KNEE SURGERY     torn menicus   LAPAROSCOPY N/A 02/24/2017   Procedure: LAPAROSCOPY DIAGNOSTIC;  Surgeon: Ricarda Frame, MD;  Location: [name];  Service: General;  Laterality: N/A;   LYSIS OF ADHESION N/A 02/24/2017   Procedure: LYSIS OF ADHESION;  Surgeon: Ricarda Frame, MD;  Location: [name];  Service:  General;  Laterality: N/A;   PULMONARY VENOGRAPHY N/A 11/19/2016   Procedure: Pulmonary Venography; IVC filter placement; possible pulmonary thrombectomy;  Surgeon: Renford Dills, MD;  Location: [name];  Service: Cardiovascular;  Laterality: N/A;   SACROPLASTY N/A 11/08/2017   Procedure: SACROPLASTY S2;  Surgeon: Kennedy Bucker, MD;  Location: [name];  Service: Orthopedics;  Laterality: N/A;   SIGMOIDOSCOPY N/A 11/13/2016   Procedure: endoscopic  flexible SIGMOIDOSCOPY;  Surgeon: Lattie Haw, MD;  Location: [name];  Service: General;  Laterality: N/A;   SIGMOIDOSCOPY N/A 05/19/2017   Procedure: Arnell Sieving;  Surgeon: Ricarda Frame, MD;  Location: [name];  Service: General;  Laterality: N/A;   VIDEO BRONCHOSCOPY WITH ENDOBRONCHIAL NAVIGATION N/A 02/23/2021   Procedure: ROBOTIC VIDEO BRONCHOSCOPY WITH ENDOBRONCHIAL NAVIGATION;  Surgeon: [name], MD;  Location: [name];  Service: Pulmonary;  Laterality: N/A;    Social History   Socioeconomic History   Marital status: Married    Spouse name: Not on file   Number of children: Not on file   Years of education: Not on file   Highest education level: Not on file  Occupational History  Not on file  Tobacco Use   Smoking status: Never   Smokeless tobacco: Never  Vaping Use   Vaping status: Never Used  Substance and Sexual Activity   Alcohol use: No    Alcohol/week: 0.0 standard drinks of alcohol   Drug use: No   Sexual activity: Never  Other Topics Concern   Not on file  Social History Narrative   Not on file   Social Determinants of Health   Financial Resource Strain: Low Risk  (12/01/2022)   [facility]   Overall Financial Resource Strain (CARDIA)    Difficulty of Paying Living Expenses: Not hard at all  Food Insecurity: No Food Insecurity (12/01/2022)   Received from [facility]   Hunger Vital Sign    Worried About Running Out  of Food in the Last Year: Never true    Ran Out of Food in the Last Year: Never true  Transportation Needs: No Transportation Needs (12/01/2022)   Received from [facility] - Transportation    In the past 12 months, has lack of transportation kept you from medical appointments or from getting medications?: No    Lack of Transportation (Non-Medical): No  Physical Activity: Not on file  Stress: Not on file  Social Connections: Not on file  Intimate Partner Violence: Not on file    Family History  Problem Relation Age of Onset   Diabetes Mother    Heart disease Mother    Cancer Mother    Breast cancer Mother        >50   Heart disease Father    Diabetes Sister    Heart disease Sister      Current Outpatient Medications:    calcium carbonate (CALCIUM 600) 600 MG TABS tablet, Take 1 tablet (600 mg total) by mouth 2 (two) times daily with a meal., Disp: 60 tablet, Rfl: 5   candesartan (ATACAND) 16 MG tablet, Take 16 mg by mouth in the morning., Disp: , Rfl:    cholecalciferol (VITAMIN D) 25 MCG (1000 UNIT) tablet, Take 1,000 Units by mouth in the morning., Disp: , Rfl:    citalopram (CELEXA) 10 MG tablet, Take 1 tablet by mouth daily., Disp: , Rfl:    Cyanocobalamin (VITAMIN B-12) 5000 MCG TBDP, Take 5,000 mcg by mouth in the morning., Disp: , Rfl:    cyclobenzaprine (FLEXERIL) 10 MG tablet, Take 10 mg by mouth at bedtime., Disp: , Rfl:    Dulaglutide 1.5 MG/0.5ML SOPN, Inject 1.5 mg into the skin every Saturday., Disp: , Rfl:    gabapentin (NEURONTIN) 100 MG capsule, Take 200 mg by mouth with breakfast, with lunch, and with evening meal., Disp: , Rfl:    gabapentin (NEURONTIN) 300 MG capsule, Take 300 mg by mouth at bedtime., Disp: , Rfl:    glipiZIDE (GLUCOTROL) 10 MG tablet, Take 10 mg by mouth 2 (two) times daily before a meal., Disp: , Rfl:    HYDROcodone-acetaminophen (NORCO/VICODIN) 5-325 MG tablet, Take 1 tablet by mouth every 6 (six) hours as needed  for moderate pain., Disp: , Rfl:    Magnesium Oxide -Mg Supplement 500 MG TABS, Take by mouth., Disp: , Rfl:    meclizine (ANTIVERT) 25 MG tablet, Take 25 mg by mouth 3 (three) times daily as needed for dizziness., Disp: , Rfl:    oxybutynin (DITROPAN) 5 MG tablet, Take 1 tablet by mouth 2 (two) times daily., Disp: , Rfl:    rOPINIRole (  REQUIP) 0.5 MG tablet, Take 0.5 mg by mouth at bedtime., Disp: , Rfl:    warfarin (COUMADIN) 6 MG tablet, Take 6 mg by mouth See admin instructions. Take 1 tablet (6 mg) by mouth on Mondays, Tuesdays, Wednesdays, & Thursdays Take 1 tablet (6 mg) with 1 tablet (1 mg) by mouth on Fridays, Saturdays & Sundays (7 mg), Disp: , Rfl:   Physical exam:  Vitals:   01/21/23 1016  BP: 123/75  Pulse: 76  Resp: 18  Temp: (!) 97.4 F (36.3 C)  TempSrc: Tympanic  SpO2: 98%  Weight: 205 lb 3.2 oz (93.1 kg)  Height: 5\' 3"  (1.6 m)   Physical Exam Cardiovascular:     Rate and Rhythm: Normal rate and regular rhythm.     Heart sounds: Normal heart sounds.  Pulmonary:     Effort: Pulmonary effort is normal.     Breath sounds: Normal breath sounds.  Skin:    General: Skin is warm and dry.  Neurological:     Mental Status: She is alert and oriented to person, place, and time.         Latest Ref Rng & Units 12/02/2022    7:22 PM  CMP  Glucose 70 - 99 mg/dL 324   BUN 8 - 23 mg/dL 13   Creatinine 4.01 - 1.00 mg/dL 0.27   Sodium 253 - 664 mmol/L 138   Potassium 3.5 - 5.1 mmol/L 3.8   Chloride 98 - 111 mmol/L 106   CO2 22 - 32 mmol/L 20   Calcium 8.9 - 10.3 mg/dL 9.3   Total Protein 6.5 - 8.1 g/dL 7.8   Total Bilirubin 0.3 - 1.2 mg/dL 1.1   Alkaline Phos 38 - 126 U/L 47   AST 15 - 41 U/L 32   ALT 0 - 44 U/L 30       Latest Ref Rng & Units 12/02/2022    7:22 PM  CBC  WBC 4.0 - 10.5 K/uL 10.4   Hemoglobin 12.0 - 15.0 g/dL 40.3   Hematocrit 47.4 - 46.0 % 45.0   Platelets 150 - 400 K/uL 239     No images are attached to the encounter.  CT Chest Wo  Contrast  Result Date: 01/21/2023 CLINICAL DATA:  Surveillance right upper lobe lung cancer. * Tracking Code: BO * EXAM: CT CHEST WITHOUT CONTRAST TECHNIQUE: Multidetector CT imaging of the chest was performed following the standard protocol without IV contrast. RADIATION DOSE REDUCTION: This exam was performed according to the departmental dose-optimization program which includes automated exposure control, adjustment of the mA and/or kV according to patient size and/or use of iterative reconstruction technique. COMPARISON:  CT 06/21/2022 and older FINDINGS: Cardiovascular: On this non IV contrast exam, heart is nonenlarged. Trace pericardial fluid the thoracic aorta has a normal course and caliber with some calcified plaque coronary artery calcifications are noted. Mediastinum/Nodes: Normal caliber thoracic esophagus. Preserved thyroid gland overall. No specific abnormal lymph node enlargement identified in the axillary regions or left hilum. There are several small lymph nodes seen throughout the mediastinum in a similar configuration to previous examination. Example just anterior to the right main bronchus would measure 16 by 8 mm today and in retrospect 16 x 8 mm. Overall these are similar slight fullness in the right lung hilum similar to previous as well. Lungs/Pleura: Left lung is without consolidation, pneumothorax or effusion. There is some basilar scar or atelectatic changes. Stable small nodule in the lingula posteriorly on series 4, image 65 measuring 3  mm There is some consolidative areas of opacity right perihilar with distortion of the adjacent bronchi, unchanged in extent and distribution from the previous examination no new right-sided lung mass. There is some increasing opacity and subtle nodularity in the medial right lung base. Example of the focal area on series 4, image 92 which is subpleural measuring 5 mm. There is also some increasing subtle nodularity peripheral to the consolidative  perihilar area such as series 4, image 47 measuring 4 mm. Few other small foci are seen. Upper Abdomen: The adrenal glands are preserved in the upper abdomen. Stones are seen in the gallbladder Musculoskeletal: Mild degenerative changes are seen along the spine. IMPRESSION: Persistent consolidative ill-defined opacity right perihilar with bronchiectasis and distortion. Stable small mediastinal lymph nodes as well. Some tiny areas of increasing nodularity are seen in the right lung of uncertain etiology and significance. Recommend follow up imaging in 3 months. Gallstones. Aortic Atherosclerosis (ICD10-I70.0). Electronically Signed   By: Karen Kays M.D.   On: 01/21/2023 10:45     Assessment and plan- Patient is a 72 y.o. female  with history of right upper lobe lung cancer s/p SBRT .  She is here for a routine Follow-up I have reviewed CT chest images independently and did not feel that she had overt evidence of progressive disease.  Her official reports were not back at the time of my visit.  Official CT scan report shows consolidative opacity in the right perihilar area with bronchiectasis and distortion possible increasing nodularity in the right lung of uncertain etiology.  I will reach out to Dr. Jayme Cloud about these findings but as per radiology recommendations I will repeat a CT chest without contrast in 3 months and see her thereafter.  I will call the patient after I hear back from Dr. Jayme Cloud as well if a repeat bronchoscopy is needed at this time   Visit Diagnosis 1. Cancer of upper lobe of right lung [facility])      Dr. Owens Shark, MD, MPH [facility] at [facility] [phone] 01/23/2023 5:17 PM

## 2023-01-25 ENCOUNTER — Telehealth: Payer: Self-pay | Admitting: *Deleted

## 2023-01-25 NOTE — Telephone Encounter (Signed)
I called her and left voicemail. Please ask her if she still wishes to speak to me

## 2023-01-25 NOTE — Telephone Encounter (Signed)
Patient called reporting that the doctor told her that she would call her this weekend and did not call her. She is  requesting a cell today from doctor as per discussion at her last visit. [phone]  Assessment and plan- Patient is a 72 y.o. female  with history of right upper lobe lung cancer s/p SBRT .  She is here for a routine Follow-up I have reviewed CT chest images independently and did not feel that she had overt evidence of progressive disease.  Her official reports were not back at the time of my visit.  Official CT scan report shows consolidative opacity in the right perihilar area with bronchiectasis and distortion possible increasing nodularity in the right lung of uncertain etiology.  I will reach out to Dr. Jayme Cloud about these findings but as per radiology recommendations I will repeat a CT chest without contrast in 3 months and see her thereafter.  I will call the patient after I hear back from Dr. Jayme Cloud as well if a repeat bronchoscopy is needed at this time   Visit Diagnosis 1. Cancer of upper lobe of right lung [facility])         Dr. Owens Shark, MD, MPH [facility] [phone] 01/23/2023 5:17 PM

## 2023-01-31 ENCOUNTER — Telehealth: Payer: Self-pay | Admitting: Oncology

## 2023-01-31 ENCOUNTER — Other Ambulatory Visit: Payer: Self-pay | Admitting: *Deleted

## 2023-01-31 DIAGNOSIS — C3411 Malignant neoplasm of upper lobe, right bronchus or lung: Secondary | ICD-10-CM

## 2023-01-31 NOTE — Telephone Encounter (Signed)
Pt called to cancel 6 month CT and MD appt. She stated she does not want these appts and wants the Dr to call her to tell her why they were scheduled. Appts have been canceled.

## 2023-01-31 NOTE — Telephone Encounter (Signed)
When patient came to see me her ct results were not back. I called her and she did not pick up so left her a voicemail with the results. I have called her today as well and again it goes to voicemail. She needs a CT in nov 2024 as per radiology recs. If any of you can get in touch with her, please connect to me and I can speak to her

## 2023-02-10 DIAGNOSIS — Z86711 Personal history of pulmonary embolism: Secondary | ICD-10-CM | POA: Diagnosis not present

## 2023-02-10 DIAGNOSIS — Z7901 Long term (current) use of anticoagulants: Secondary | ICD-10-CM | POA: Diagnosis not present

## 2023-02-21 ENCOUNTER — Telehealth: Payer: Self-pay | Admitting: Pain Medicine

## 2023-02-24 ENCOUNTER — Telehealth: Payer: Self-pay | Admitting: Pain Medicine

## 2023-02-24 NOTE — Telephone Encounter (Signed)
Kara [name] wants to know if she can get some injections in her back? Says it is hurting pretty bad.

## 2023-03-01 ENCOUNTER — Ambulatory Visit: Payer: PPO | Admitting: Pain Medicine

## 2023-03-03 ENCOUNTER — Ambulatory Visit: Payer: PPO | Attending: Pain Medicine | Admitting: Pain Medicine

## 2023-03-03 ENCOUNTER — Encounter: Payer: Self-pay | Admitting: Pain Medicine

## 2023-03-03 VITALS — BP 130/68 | HR 83 | Temp 97.9°F | Resp 18 | Ht 63.0 in | Wt 203.0 lb

## 2023-03-03 DIAGNOSIS — M5459 Other low back pain: Secondary | ICD-10-CM | POA: Diagnosis not present

## 2023-03-03 DIAGNOSIS — G8929 Other chronic pain: Secondary | ICD-10-CM | POA: Insufficient documentation

## 2023-03-03 DIAGNOSIS — M4306 Spondylolysis, lumbar region: Secondary | ICD-10-CM | POA: Insufficient documentation

## 2023-03-03 DIAGNOSIS — M47817 Spondylosis without myelopathy or radiculopathy, lumbosacral region: Secondary | ICD-10-CM | POA: Insufficient documentation

## 2023-03-03 DIAGNOSIS — Z6841 Body Mass Index (BMI) 40.0 and over, adult: Secondary | ICD-10-CM | POA: Diagnosis not present

## 2023-03-03 DIAGNOSIS — Z7901 Long term (current) use of anticoagulants: Secondary | ICD-10-CM | POA: Diagnosis not present

## 2023-03-03 DIAGNOSIS — M545 Low back pain, unspecified: Secondary | ICD-10-CM | POA: Diagnosis not present

## 2023-03-03 DIAGNOSIS — M47816 Spondylosis without myelopathy or radiculopathy, lumbar region: Secondary | ICD-10-CM | POA: Diagnosis not present

## 2023-03-03 DIAGNOSIS — R937 Abnormal findings on diagnostic imaging of other parts of musculoskeletal system: Secondary | ICD-10-CM | POA: Insufficient documentation

## 2023-03-03 NOTE — Patient Instructions (Addendum)
______________________________________________________________________    OTC Supplements:   The following is a list of over-the-counter (OTC) supplements that have been found to have [facility] [employer]) studies suggesting that they may be of some benefits when used in moderation in some chronic pain-related conditions.  NOTE:  Always consult with your primary care provider and/or pharmacist before taking any OTC medications to make sure they will not interact with your current medications. Always use manufacturer's recommended dosage.  Supplement Possible benefit May be of benefit in treatment of  Active ingredient(s)  Turmeric/curcumin anti-inflammatory Joint and muscle aches and pain associated with arthritis and inflammation The main active ingredient in turmeric is curcumin.  Glucosamine/chondroitin (triple strength) may slow loss of articular cartilage Osteoarthritis glucosamine HCl and chondroitin sulfate  Vitamin D-3 may suppress release of chemicals associated with inflammation Joint and muscle aches and pain associated with arthritis and inflammation CHOLECALCIFEROL; ALPHA.-TOCOPHEROL, D  Moringa anti-inflammatory with mild analgesic effects Joint and muscle aches and pain associated with arthritis and inflammation Many:  Phenolic acids: Gallic acid, ellagic acid, ferulic acid, caffeic acid, o-coumaric acid, and chlorogenic acid. Flavonoids: Rutin, quercetin, rhamnetin, kaempferol, apigenin, and myricetin. Terpenes: Lutein, all-E-luteoxanthin, 13-Z-lutein, 15-Z-?-carotene, and all-E-zeaxanthin. Alkaloids: Marumoside A, marumoside B, and pyrrolemarumine-4?-O-?-L-rhamnopyranoside. Vitamins: Vitamin A and vitamin C. Protein: Milk protein  Melatonin Helps reset sleep cycle. Insomnia. May also be helpful in neurodegenerative disorders melatonin, also known as N-acetyl-5-methoxytryptamine  Vitamin B-12 may help keep nerves and blood cells healthy as well as maintaining function of  nervous system Neuropathies. Nerve pain (Burning pain) methylcobalamin and 5-deoxyadenosylcobalamin  Alpha-Lipoic-Acid (ALA) antioxidant that may help with nerve health, pain, and blocking the activation of some inflammatory chemicals Diabetic neuropathy and metabolic syndrome Alpha-lipoic acid (LA), or 1,2-dithiolane-3-pentanoic acid  superoxide dismutase (SOD) Currently being reviewed.  Superoxide dismutase (SOD); Copper-zinc superoxide dismutase  Tiger Balm Currently being reviewed.  Camphor; Menthol; Capsicum Extract (Capsicin); Methyl Salicylate; Essential Oils (Cassia Oil, C[place]uput Oil, Clove Oil, and Dementholized Mint Oil)    ______________________________________________________________________      ______________________________________________________________________    Blood Thinners  IMPORTANT NOTICE:  If you take any of these, make sure to notify the nursing staff.  Failure to do so may result in serious injury.  Recommended time intervals to stop and restart blood-thinners, before & after invasive procedures  Generic Name Brand Name Pre-procedure: Stop medication for this amount of time before your procedure: Post-procedure: Wait this amount of time after the procedure before restarting your medication:  Abciximab Reopro 15 days 2 hrs  Alteplase Activase 10 days 10 days  Anagrelide Agrylin    Apixaban Eliquis 3 days 6 hrs  Cilostazol Pletal 3 days 5 hrs  Clopidogrel Plavix 7-10 days 2 hrs  Dabigatran Pradaxa 5 days 6 hrs  Dalteparin Fragmin 24 hours 4 hrs  Dipyridamole Aggrenox 11days 2 hrs  Edoxaban Lixiana; Savaysa 3 days 2 hrs  Enoxaparin  Lovenox 24 hours 4 hrs  Eptifibatide Integrillin 8 hours 2 hrs  Fondaparinux  Arixtra 72 hours 12 hrs  Hydroxychloroquine Plaquenil 11 days   Prasugrel Effient 7-10 days 6 hrs  Reteplase Retavase 10 days 10 days  Rivaroxaban Xarelto 3 days 6 hrs  Ticagrelor Brilinta 5-7 days 6 hrs  Ticlopidine Ticlid 10-14 days 2 hrs   Tinzaparin Innohep 24 hours 4 hrs  Tirofiban Aggrastat 8 hours 2 hrs  Warfarin Coumadin 5 days 2 hrs   Other medications with blood-thinning effects  Product indications Generic (Brand) names Note  Cholesterol Lipitor Stop 4 days before procedure  Blood thinner (injectable) Heparin (LMW or LMWH Heparin) Stop 24 hours before procedure  Cancer Ibrutinib (Imbruvica) Stop 7 days before procedure  Malaria/Rheumatoid Hydroxychloroquine (Plaquenil) Stop 11 days before procedure  Thrombolytics  10 days before or after procedures   Over-the-counter (OTC) Products with blood-thinning effects  Product Common names Stop Time  Aspirin > 325 mg Goody Powders, Excedrin, etc. 11 days  Aspirin <= 81 mg  7 days  Fish oil  4 days  Garlic supplements  7 days  Ginkgo biloba  36 hours  Ginseng  24 hours  NSAIDs Ibuprofen, Naprosyn, etc. 3 days  Vitamin E  4 days   ______________________________________________________________________      ______________________________________________________________________    Procedure instructions  Stop blood-thinners  Do not eat or drink fluids (other than water) for 6 hours before your procedure  No water for 2 hours before your procedure  Take your blood pressure medicine with a sip of water  Arrive 30 minutes before your appointment  If sedation is planned, bring suitable driver. Pennie Banter, Benedetto Goad, & public transportation are NOT APPROVED)  Carefully read the "Preparing for your procedure" detailed instructions  If you have questions call us at [phone]  ______________________________________________________________________      ______________________________________________________________________    Preparing for your procedure  Appointments: If you think you may not be able to keep your appointment, call 24-48 hours in advance to cancel. We need time to make it available to others.  During your procedure appointment there will be: No  Prescription Refills. No disability issues to discussed. No medication changes or discussions.  Instructions: Food intake: Avoid eating anything solid for at least 8 hours prior to your procedure. Clear liquid intake: You may take clear liquids such as water up to 2 hours prior to your procedure. (No carbonated drinks. No soda.) Transportation: Unless otherwise stated by your physician, bring a driver. (Driver cannot be a Market researcher, Pharmacist, community, or any other form of public transportation.) Morning Medicines: Except for blood thinners, take all of your other morning medications with a sip of water. Make sure to take your heart and blood pressure medicines. If your blood pressure's lower number is above 100, the case will be rescheduled. Blood thinners: Make sure to stop your blood thinners as instructed.  If you take a blood thinner, but were not instructed to stop it, call our office [phone] and ask to talk to a nurse. Not stopping a blood thinner prior to certain procedures could lead to serious complications. Diabetics on insulin: Notify the staff so that you can be scheduled 1st case in the morning. If your diabetes requires high dose insulin, take only  of your normal insulin dose the morning of the procedure and notify the staff that you have done so. Preventing infections: Shower with an antibacterial soap the morning of your procedure.  Build-up your immune system: Take 1000 mg of Vitamin C with every meal (3 times a day) the day prior to your procedure. Antibiotics: Inform the nursing staff if you are taking any antibiotics or if you have any conditions that may require antibiotics prior to procedures. (Example: recent joint implants)   Pregnancy: If you are pregnant make sure to notify the nursing staff. Not doing so may result in injury to the fetus, including death.  Sickness: If you have a cold, fever, or any active infections, call and cancel or reschedule your procedure. Receiving steroids  while having an infection may result in complications. Arrival: You must be in  the facility at least 30 minutes prior to your scheduled procedure. Tardiness: Your scheduled time is also the cutoff time. If you do not arrive at least 15 minutes prior to your procedure, you will be rescheduled.  Children: Do not bring any children with you. Make arrangements to keep them home. Dress appropriately: There is always a possibility that your clothing may get soiled. Avoid long dresses. Valuables: Do not bring any jewelry or valuables.  Reasons to call and reschedule or cancel your procedure: (Following these recommendations will minimize the risk of a serious complication.) Surgeries: Avoid having procedures within 2 weeks of any surgery. (Avoid for 2 weeks before or after any surgery). Flu Shots: Avoid having procedures within 2 weeks of a flu shots or . (Avoid for 2 weeks before or after immunizations). Barium: Avoid having a procedure within 7-10 days after having had a radiological study involving the use of radiological contrast. (Myelograms, Barium swallow or enema study). Heart attacks: Avoid any elective procedures or surgeries for the initial 6 months after a "Myocardial Infarction" (Heart Attack). Blood thinners: It is imperative that you stop these medications before procedures. Let us know if you if you take any blood thinner.  Infection: Avoid procedures during or within two weeks of an infection (including chest colds or gastrointestinal problems). Symptoms associated with infections include: Localized redness, fever, chills, night sweats or profuse sweating, burning sensation when voiding, cough, congestion, stuffiness, runny nose, sore throat, diarrhea, nausea, vomiting, cold or Flu symptoms, recent or current infections. It is specially important if the infection is over the area that we intend to treat. Heart and lung problems: Symptoms that may suggest an active cardiopulmonary problem  include: cough, chest pain, breathing difficulties or shortness of breath, dizziness, ankle swelling, uncontrolled high or unusually low blood pressure, and/or palpitations. If you are experiencing any of these symptoms, cancel your procedure and contact your primary care physician for an evaluation.  Remember:  Regular Business hours are:  Monday to Thursday 8:00 AM to 4:00 PM  Provider's Schedule: Delano Metz, MD:  Procedure days: Tuesday and Thursday 7:30 AM to 4:00 PM  Edward Jolly, MD:  Procedure days: Monday and Wednesday 7:30 AM to 4:00 PM Last  Updated: 01/11/2023 ______________________________________________________________________      ______________________________________________________________________    General Risks and Possible Complications  Patient Responsibilities: It is important that you read this as it is part of your informed consent. It is our duty to inform you of the risks and possible complications associated with treatments offered to you. It is your responsibility as a patient to read this and to ask questions about anything that is not clear or that you believe was not covered in this document.  Patient's Rights: You have the right to refuse treatment. You also have the right to change your mind, even after initially having agreed to have the treatment done. However, under this last option, if you wait until the last second to change your mind, you may be charged for the materials used up to that point.  Introduction: Medicine is not an Visual merchandiser. Everything in Medicine, including the lack of treatment(s), carries the potential for danger, harm, or loss (which is by definition: Risk). In Medicine, a complication is a secondary problem, condition, or disease that can aggravate an already existing one. All treatments carry the risk of possible complications. The fact that a side effects or complications occurs, does not imply that the treatment was  conducted incorrectly. It must be clearly  understood that these can happen even when everything is done following the highest safety standards.  No treatment: You can choose not to proceed with the proposed treatment alternative. The "PRO(s)" would include: avoiding the risk of complications associated with the therapy. The "CON(s)" would include: not getting any of the treatment benefits. These benefits fall under one of three categories: diagnostic; therapeutic; and/or palliative. Diagnostic benefits include: getting information which can ultimately lead to improvement of the disease or symptom(s). Therapeutic benefits are those associated with the successful treatment of the disease. Finally, palliative benefits are those related to the decrease of the primary symptoms, without necessarily curing the condition (example: decreasing the pain from a flare-up of a chronic condition, such as incurable terminal cancer).  General Risks and Complications: These are associated to most interventional treatments. They can occur alone, or in combination. They fall under one of the following six (6) categories: no benefit or worsening of symptoms; bleeding; infection; nerve damage; allergic reactions; and/or death. No benefits or worsening of symptoms: In Medicine there are no guarantees, only probabilities. No healthcare provider can ever guarantee that a medical treatment will work, they can only state the probability that it may. Furthermore, there is always the possibility that the condition may worsen, either directly, or indirectly, as a consequence of the treatment. Bleeding: This is more common if the patient is taking a blood thinner, either prescription or over the counter (example: Goody Powders, Fish oil, Aspirin, Garlic, etc.), or if suffering a condition associated with impaired coagulation (example: Hemophilia, cirrhosis of the liver, low platelet counts, etc.). However, even if you do not have one on  these, it can still happen. If you have any of these conditions, or take one of these drugs, make sure to notify your treating physician. Infection: This is more common in patients with a compromised immune system, either due to disease (example: diabetes, cancer, human immunodeficiency virus [HIV], etc.), or due to medications or treatments (example: therapies used to treat cancer and rheumatological diseases). However, even if you do not have one on these, it can still happen. If you have any of these conditions, or take one of these drugs, make sure to notify your treating physician. Nerve Damage: This is more common when the treatment is an invasive one, but it can also happen with the use of medications, such as those used in the treatment of cancer. The damage can occur to small secondary nerves, or to large primary ones, such as those in the spinal cord and brain. This damage may be temporary or permanent and it may lead to impairments that can range from temporary numbness to permanent paralysis and/or brain death. Allergic Reactions: Any time a substance or material comes in contact with our body, there is the possibility of an allergic reaction. These can range from a mild skin rash (contact dermatitis) to a severe systemic reaction (anaphylactic reaction), which can result in death. Death: In general, any medical intervention can result in death, most of the time due to an unforeseen complication. ______________________________________________________________________

## 2023-03-03 NOTE — Progress Notes (Signed)
PROVIDER NOTE: Information contained herein reflects review and annotations entered in association with encounter. Interpretation of such information and data should be left to medically-trained personnel. Information provided to patient can be located elsewhere in the medical record under "Patient Instructions". Document created using STT-dictation technology, any transcriptional errors that may result from process are unintentional.    Patient: Kara [name]  Service Category: E/M  Provider: Oswaldo Done, MD  DOB: December 22, 1950  DOS: 03/03/2023  Referring Provider: Barbette Reichmann, MD  MRN: [id number]  Specialty: Interventional Pain Management  PCP: Barbette Reichmann, MD  Type: Established Patient  Setting: Ambulatory outpatient    Location: Office  Delivery: Face-to-face     HPI  Ms. Malaija Boilard Loraine, a 72 y.o. year old female, is here today because of her Chronic bilateral low back pain without sciatica [M54.50, G89.29]. Ms. Lormand primary complain today is Back Pain (lower)  Pertinent problems: Ms. 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); Lumbar pars defect (L3 and L4) (Bilateral); Diabetic peripheral neuropathy (HCC); Chronic lower extremity pain (2ry area of Pain) (Bilateral); Chronic radicular pain of lower extremity; Osteoarthritis of hip (Right); Chronic low back pain (1ry area of Pain) (Bilateral) w/o sciatica; Spondylosis without  myelopathy or radiculopathy, lumbosacral region; Other specified dorsopathies, sacral and sacrococcygeal region; Secondary osteoarthritis of multiple sites; Sacral insufficiency fracture; DDD (degenerative disc disease), lumbosacral; Coccygodynia; Chronic sacroiliac joint pain (Left); Osteoarthritis of lumbar spine; Spondylolisthesis, lumbosacral region; Abnormal MRI, lumbar spine (08/09/2019); Spasm of muscle of lower back; Chronic hip pain (Bilateral) (R>L); Osteoarthritis of hips (Bilateral); Greater trochanteric bursitis (Right); Chronic hip pain (Right); Chronic low back pain (Right) w/o sciatica; Greater trochanteric bursitis (Left); Other bursitis of hip, not elsewhere classified (gluteus medius bursa) (Left); Lumbar facet joint pain; and Piriformis muscle pain (Right) on their pertinent problem list. Pain Assessment: Severity of Chronic pain is reported as a 8 /10. Location: Back Lower/denies. Onset: More than a month ago. Quality: Sharp. Timing: Constant. Modifying factor(s): rest. Vitals:  height is 5\' 3"  (1.6 m) and weight is 203 lb (92.1 kg). Her temporal temperature is 97.9 F (36.6 C). Her blood pressure is 130/68 and her pulse is 83. Her respiration is 18 and oxygen saturation is 95%.  BMI: Estimated body mass index is 35.96 kg/m as calculated from the following:   Height as of this encounter: 5\' 3"  (1.6 m).   Weight as of this encounter: 203 lb (92.1 kg). Last encounter: 12/28/2022. Last procedure: 12/07/2022 (86 days ago).  (Palliative bilateral lumbar facet MBB [id number])   Reason for encounter: evaluation of worsening, or previously known (established) problem.  The patient returns to the clinic today indicating that her low back pain has returned.  Again the pain worsens with prolonged standing.  It is also worsened with hyperextension and rotation.  She has decreased range of motion of the lumbar spine.  Today she told me that she has been taking over-the-counter ibuprofen 800 mg twice a  day.  I have informed her that she should not be taking any NSAIDs secondary to the fact that she is  anticoagulated on Coumadin and if she develops a bleeding ulcer, it could be life-threatening.  She takes hydrocodone at bedtime but even the ibuprofen causes her to be somewhat sleepy and she actually indicates having gone down to taking 600 mg every morning instead of the 800 mg because the 800 mg made her sleepy.  As I said before, I have instructed her to stop taking this ibuprofen and I reminded her that all NSAIDs are contraindicated when taking anticoagulants.  She understood and accepted.  At this point we will bring her in to repeat the bilateral lumbar facet blocks which usually provide her with good relief of the pain.  I have instructed her to begin to rely more on over-the-counter supplements such as turmeric, glucosamine, moringa, and vitamin D3 among others.  I have provided her with a list of some of the supplements and I have reminded her to always consult her primary care physician and the pharmacist to check on the possibility of any type of drug to drug interactions.  Pharmacotherapy Assessment  Analgesic: No chronic opioid analgesics therapy prescribed by our practice, 2ry to Medication Agreement Violation. Oxycodone IR 10 mg, 1 tab PO qd,  from PCP.  (Getting oxycodone from PCP in multiple occasions, according to PMP)(11/21/20; 12/26/20; 01/16/21; 02/13/21). Opioid analgesic pharmacotherapy D/C'ed on 03/03/2021. MME: 20 mg/day.   Monitoring: [place] PMP: PDMP not reviewed this encounter.       Pharmacotherapy: No side-effects or adverse reactions reported. Compliance: No problems identified. Effectiveness: Clinically acceptable.  No notes on file  No results found for: "CBDTHCR" No results found for: "D8THCCBX" No results found for: "D9THCCBX"  UDS:  Summary  Date Value Ref Range Status  11/12/2020 Note  Final    Comment:     ==================================================================== [employer] Select 13 (MW) ==================================================================== Test                             Result       Flag       Units  Drug Present and Declared for Prescription Verification   Norhydrocodone                 61           EXPECTED   ng/mg creat    Norhydrocodone is an expected metabolite of hydrocodone.    Tramadol                       >4202        EXPECTED   ng/mg creat   O-Desmethyltramadol            >4202        EXPECTED   ng/mg creat   N-Desmethyltramadol            1712         EXPECTED   ng/mg creat    Source of tramadol is a prescription medication. O-desmethyltramadol    and N-desmethyltramadol are expected metabolites of tramadol.  Drug Absent but Declared for Prescription Verification   Hydrocodone                    Not Detected UNEXPECTED ng/mg creat    Hydrocodone is almost always present in patients taking this drug    consistently. Absence of hydrocodone could be due to lapse of time    since the last dose or unusual pharmacokinetics (rapid metabolism).  ==================================================================== Test  Result    Flag   Units      Ref Range   Creatinine              119              mg/dL      >=08 ==================================================================== Declared Medications:  The flagging and interpretation on this report are based on the  following declared medications.  Unexpected results may arise from  inaccuracies in the declared medications.   **Note: The testing scope of this panel includes these medications:   Hydrocodone  Tramadol   **Note: The testing scope of this panel does not include the  following reported medications:   Acetaminophen  Alendronate (Fosamax)  Aspirin  Calcium  Candesartan  Cholecalciferol  Cyanocobalamin  Cyclobenzaprine  Dulaglutide  Gabapentin  Glipizide   Meclizine  Multivitamin  Nystatin  Ondansetron  Ropinirole  Tolterodine  Warfarin ==================================================================== For clinical consultation, please call [phone]. ====================================================================       ROS  Constitutional: Denies any fever or chills Gastrointestinal: No reported hemesis, hematochezia, vomiting, or acute GI distress Musculoskeletal: Denies any acute onset joint swelling, redness, loss of ROM, or weakness Neurological: No reported episodes of acute onset apraxia, aphasia, dysarthria, agnosia, amnesia, paralysis, loss of coordination, or loss of consciousness  Medication Review  Dulaglutide, HYDROcodone-acetaminophen, Magnesium Oxide -Mg Supplement, Vitamin B-12, calcium carbonate, candesartan, cholecalciferol, citalopram, cyclobenzaprine, gabapentin, glipiZIDE, meclizine, oxybutynin, rOPINIRole, and warfarin  History Review  Allergy: Ms. Grewell is allergic to adhesive [tape], other, latex, and morphine and codeine. Drug: Ms. Briguglio  reports no history of drug use. Alcohol:  reports no history of alcohol use. Tobacco:  reports that she has never smoked. She has never used smokeless tobacco. Social: Ms. Besselman  reports that she has never smoked. She has never used smokeless tobacco. She reports that she does not drink alcohol and does not use drugs. Medical:  has a past medical history of Anginal pain (HCC), Arthritis, CHF (congestive heart failure) (HCC), Diabetes (HCC), Diverticulitis (2018), History of being hospitalized, History of hiatal hernia, Hypertension, Osteoporosis, PE (pulmonary thromboembolism) (HCC), 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HCC), and Vertigo. Surgical: Ms. Marcello  has a past surgical history that includes Knee surgery; Carpal tunnel release (Bilateral); Colon resection sigmoid (N/A, 11/02/2016); Colostomy (N/A, 11/02/2016); Sigmoidoscopy (N/A, 11/13/2016); PULMONARY  VENOGRAPHY (N/A, 11/19/2016); Colonoscopy with propofol (N/A, 02/03/2017); Colon surgery (11/02/2016); IVC FILTER INSERTION (Right, 10/2016); Colostomy reversal (N/A, 02/24/2017); Appendectomy (N/A, 02/24/2017); Lysis of adhesion (N/A, 02/24/2017); laparoscopy (N/A, 02/24/2017); Ileo loop diversion (N/A, 02/24/2017); Sigmoidoscopy (N/A, 05/19/2017); Ileostomy closure (N/A, 06/01/2017); IVC FILTER REMOVAL (N/A, 08/09/2017); Sacroplasty (N/A, 11/08/2017); Cataract extraction w/PHACO (Left, 11/21/2018); Cataract extraction w/PHACO (Right, 12/12/2018); and Video bronchoscopy with endobronchial navigation (N/A, 02/23/2021). Family: family history includes Breast cancer in her mother; Cancer in her mother; Diabetes in her mother and sister; Heart disease in her father, mother, and sister.  Laboratory Chemistry Profile   Renal Lab Results  Component Value Date   BUN 13 12/02/2022   CREATININE 0.71 12/02/2022   GFRAA >60 07/09/2019   GFRNONAA >60 12/02/2022    Hepatic Lab Results  Component Value Date   AST 32 12/02/2022   ALT 30 12/02/2022   ALBUMIN 4.4 12/02/2022   ALKPHOS 47 12/02/2022   LIPASE 26 12/02/2022    Electrolytes Lab Results  Component Value Date   NA 138 12/02/2022   K 3.8 12/02/2022   CL 106 12/02/2022   CALCIUM 9.3 12/02/2022   MG 1.7 08/14/2020   PHOS  3.3 11/07/2016    Bone Lab Results  Component Value Date   25OHVITD1 25 (L) 11/02/2017   25OHVITD2 <1.0 11/02/2017   25OHVITD3 25 11/02/2017    Inflammation (CRP: Acute Phase) (ESR: Chronic Phase) Lab Results  Component Value Date   CRP 1.7 (H) 08/15/2020   ESRSEDRATE 12 11/02/2017   LATICACIDVEN 1.7 11/21/2021         Note: Above Lab results reviewed.  Recent Imaging Review  CT Chest Wo Contrast CLINICAL DATA:  Surveillance right upper lobe lung cancer. * Tracking Code: BO *  EXAM: CT CHEST WITHOUT CONTRAST  TECHNIQUE: Multidetector CT imaging of the chest was performed following the standard protocol without IV  contrast.  RADIATION DOSE REDUCTION: This exam was performed according to the departmental dose-optimization program which includes automated exposure control, adjustment of the mA and/or kV according to patient size and/or use of iterative reconstruction technique.  COMPARISON:  CT 06/21/2022 and older  FINDINGS: Cardiovascular: On this non IV contrast exam, heart is nonenlarged. Trace pericardial fluid the thoracic aorta has a normal course and caliber with some calcified plaque coronary artery calcifications are noted.  Mediastinum/Nodes: Normal caliber thoracic esophagus. Preserved thyroid gland overall. No specific abnormal lymph node enlargement identified in the axillary regions or left hilum. There are several small lymph nodes seen throughout the mediastinum in a similar configuration to previous examination. Example just anterior to the right main bronchus would measure 16 by 8 mm today and in retrospect 16 x 8 mm. Overall these are similar slight fullness in the right lung hilum similar to previous as well.  Lungs/Pleura: Left lung is without consolidation, pneumothorax or effusion. There is some basilar scar or atelectatic changes. Stable small nodule in the lingula posteriorly on series 4, image 65 measuring 3 mm  There is some consolidative areas of opacity right perihilar with distortion of the adjacent bronchi, unchanged in extent and distribution from the previous examination no new right-sided lung mass. There is some increasing opacity and subtle nodularity in the medial right lung base. Example of the focal area on series 4, image 92 which is subpleural measuring 5 mm. There is also some increasing subtle nodularity peripheral to the consolidative perihilar area such as series 4, image 47 measuring 4 mm. Few other small foci are seen.  Upper Abdomen: The adrenal glands are preserved in the upper abdomen. Stones are seen in the  gallbladder  Musculoskeletal: Mild degenerative changes are seen along the spine.  IMPRESSION: Persistent consolidative ill-defined opacity right perihilar with bronchiectasis and distortion. Stable small mediastinal lymph nodes as well.  Some tiny areas of increasing nodularity are seen in the right lung of uncertain etiology and significance. Recommend follow up imaging in 3 months.  Gallstones.  Aortic Atherosclerosis (ICD10-I70.0).  Electronically Signed   By: Karen Kays M.D.   On: 01/21/2023 10:45 Note: Reviewed        Physical Exam  General appearance: Well nourished, well developed, and well hydrated. In no apparent acute distress Mental status: Alert, oriented x 3 (person, place, & time)       Respiratory: No evidence of acute respiratory distress Eyes: PERLA Vitals: BP 130/68   Pulse 83   Temp 97.9 F (36.6 C) (Temporal)   Resp 18   Ht 5\' 3"  (1.6 m)   Wt 203 lb (92.1 kg)   SpO2 95%   BMI 35.96 kg/m  BMI: Estimated body mass index is 35.96 kg/m as calculated from the following:   Height as  of this encounter: 5\' 3"  (1.6 m).   Weight as of this encounter: 203 lb (92.1 kg). Ideal: Ideal body weight: 52.4 kg (115 lb 8.3 oz) Adjusted ideal body weight: 68.3 kg (150 lb 8.2 oz)  Assessment   Diagnosis Status  1. Chronic low back pain (1ry area of Pain) (Bilateral) w/o sciatica   2. Spondylosis without myelopathy or radiculopathy, lumbosacral region   3. Lumbar facet joint pain   4. Lumbar facet arthropathy (L3-4, L4-5, and L5-S1) (Bilateral)   5. Lumbar facet syndrome (Bilateral)   6. 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 Abnormal MRI, lumbar spine (08/09/2019)   8. Chronic anticoagulation (COUMADIN)   9. Morbid obesity with BMI of 40.0-44.9, adult (HCC)    Controlled Controlled Controlled   Updated Problems: No problems updated.  Plan of Care  Problem-specific:  No problem-specific Assessment & Plan notes found for this encounter.  Ms.  Cordilia Rheault Bremner has a current medication list which includes the following long-term medication(s): candesartan, citalopram, glipizide, ropinirole, warfarin, and calcium carbonate.  Pharmacotherapy (Medications Ordered): No orders of the defined types were placed in this encounter.  Orders:  Orders Placed This Encounter  Procedures   LUMBAR FACET(MEDIAL BRANCH NERVE BLOCK) MBNB    Diagnosis: Lumbar Facet Syndrome (M47.816); Lumbosacral Facet Syndrome (M47.817); Lumbar Facet Joint Pain (M54.59) Medical Necessity Statement: 1.Severe chronic axial low back pain causing functional impairment documented by ongoing pain scale assessments. 2.Pain present for longer than 3 months (Chronic) documented to have failed noninvasive conservative therapies. 3.Absence of untreated radiculopathy. 4.There is no radiological evidence of untreated fractures, tumor, infection, or deformity.  Physical Examination Findings: Positive Kemp Maneuver: (Y)  Positive Lumbar Hyperextension-Rotation provocative test: (Y)    Standing Status:   Future    Standing Expiration Date:   06/03/2023    Scheduling Instructions:     Procedure: Lumbar facet Block     Type: Medial Branch Block     Side: Bilateral     Purpose: Palliative     Level(s): L3-4, L4-5, and L5-S1 Facets (L2, L3, L4, L5, and S1 Medial Branch)     Sedation: Patient's choice.     Timeframe: ASAP    Order Specific Question:   Where will this procedure be performed?    Answer:   [name]   Blood Thinner Instructions to Nursing    Always make sure patient has clearance from prescribing physician to stop blood thinners for interventional therapies. If the patient requires a Lovenox-bridge therapy, make sure arrangements are made to institute it with the assistance of the PCP.    Scheduling Instructions:     Have Ms. Tracey stop the Coumadin (Warfarin) X 5 days prior to procedure or surgery.   Follow-up plan:   Return for (ECT): (B)  L-FCT Blk, (Blood Thinner Protocol).      Interventional Therapies  Risk  Complexity Considerations:   ANTICOAGULATION: Coumadin (Stop: 5 days  Re-start: 2hrs)  Hx DVT & PE ALLERGY: Latex Morbid obesity (class III) (BMI>40)  metabolic syndrome  NIDDM  HTN Difficult Lumbar RFA due to morbid obesity    Planned  Pending: Palliative bilateral (L3-4, L4-5, L5-S1) lumbar facet (L2-S1) MBB [id number]  Diagnostic/therapeutic right piriformis muscle injection #1 (no steroids)    Under consideration:   Therapeutic bilateral lumbar facet RFA #2 (patient indicates preferring to have only the block without radiofrequency.)   Completed:   Therapeutic bilateral lumbar facet MBB (L3-S1) x1 (04/06/2022) (80/80/90/90)  Diagnostic/therapeutic right IA  hip inj. x2 (01/28/2022) (100/100/85/85)  Diagnostic/therapeutic left IA hip inj. x1 (01/28/2022) (100/100/85/85)  Diagnostic/therapeutic right trochanteric bursa inj. x1 (09/15/2021) (85/85/85)  Diagnostic/therapeutic left trochanteric bursa inj. x1 (01/28/2022) (100/100/85/85)  Therapeutic left lumbar facet RFA x1 (07/21/2021) (100/100/100 x1 week/80) (Lasted 9 months)  Therapeutic right lumbar facet RFA x1 (07/07/2021) (100/100/80/80) (Lasted 9 months)  Palliative/Therapeutic left L4-5 LESI x1 (11/10/2017) (100/80/0/0-25)  Diagnostic/therapeutic right L4 TFESI x1 (11/19/2021) (90/80/0/0) (fell at [employer]) Palliative left lumbar facet (L2-S1) MBB x19 (10/12/2022) (100/100/0/100/100)  Palliative right lumbar facet (L2-S1) MBB x26 (10/12/2022) (100/100/0/100/100)  Palliative right SI joint block x9 (01/28/2022) (100/100/80/80)  Diagnostic left SI joint block x2 (01/28/2022) (100/100/80/80)  Palliative/Therapeutic (Midline) caudal ESI x3 (06/08/2018) (100/100/100 x 2-3 days/0)    Therapeutic  Palliative (PRN) options:   NO PRN procedures without F2F exam by Dr. Laban Emperor due to multifactorial etiology of chronic pain. No more procedures until after 08/14/2022.     Pharmacotherapy  Nonopioids transferred 03/27/2020: Calcium       Recent Visits Date Type Provider Dept  12/28/22 Office Visit Delano Metz, MD [name]  12/07/22 Procedure visit Delano Metz, MD [name]  Showing recent visits within past 90 days and meeting all other requirements Today's Visits Date Type Provider Dept  03/03/23 Office Visit Delano Metz, MD [name]  Showing today's visits and meeting all other requirements Future Appointments Date Type Provider Dept  03/17/23 Appointment Delano Metz, MD [name]  Showing future appointments within next 90 days and meeting all other requirements  I discussed the assessment and treatment plan with the patient. The patient was provided an opportunity to ask questions and all were answered. The patient agreed with the plan and demonstrated an understanding of the instructions.  Patient advised to call back or seek an in-person evaluation if the symptoms or condition worsens.  Duration of encounter: 30 minutes.  Total time on encounter, as per AMA guidelines included both the face-to-face and non-face-to-face time personally spent by the physician and/or other qualified health care professional(s) on the day of the encounter (includes time in activities that require the physician or other qualified health care professional and does not include time in activities normally performed by clinical staff). Physician's time may include the following activities when performed: Preparing to see the patient (e.g., pre-charting review of records, searching for previously ordered imaging, lab work, and nerve conduction tests) Review of prior analgesic pharmacotherapies. Reviewing PMP Interpreting ordered tests (e.g., lab work, imaging, nerve conduction tests) Performing post-procedure evaluations, including interpretation of diagnostic procedures Obtaining and/or  reviewing separately obtained history Performing a medically appropriate examination and/or evaluation Counseling and educating the patient/family/caregiver Ordering medications, tests, or procedures Referring and communicating with other health care professionals (when not separately reported) Documenting clinical information in the electronic or other health record Independently interpreting results (not separately reported) and communicating results to the patient/ family/caregiver Care coordination (not separately reported)  Note by: Oswaldo Done, MD Date: 03/03/2023; Time: 3:01 PM

## 2023-03-10 DIAGNOSIS — Z7901 Long term (current) use of anticoagulants: Secondary | ICD-10-CM | POA: Diagnosis not present

## 2023-03-10 DIAGNOSIS — Z86711 Personal history of pulmonary embolism: Secondary | ICD-10-CM | POA: Diagnosis not present

## 2023-03-14 NOTE — Progress Notes (Unsigned)
PROVIDER NOTE: Interpretation of information contained herein should be left to medically-trained personnel. Specific patient instructions are provided elsewhere under "Patient Instructions" section of medical record. This document was created in part using STT-dictation technology, any transcriptional errors that may result from this process are unintentional.  Patient: Kara [name] Type: Established DOB: 10/04/1950 MRN: [id number] PCP: Barbette Reichmann, MD  Service: Procedure DOS: 03/15/2023 Setting: Ambulatory Location: Ambulatory outpatient facility Delivery: Face-to-face Provider: Oswaldo Done, MD Specialty: Interventional Pain Management Specialty designation: 09 Location: Outpatient facility Ref. Prov.: Barbette Reichmann, MD       Interventional Therapy   Procedure: Lumbar Facet, Medial Branch Block(s) [id number]  Laterality: Bilateral  Level: L2, L3, L4, L5, and S1 Medial Branch Level(s). Injecting these levels blocks the L3-4, L4-5, and L5-S1 lumbar facet joints. Imaging: Fluoroscopic guidance Spinal ([id number]) Anesthesia: Local anesthesia (1-2% Lidocaine) Anxiolysis: IV Versed 2.0 mg Sedation: Moderate Sedation Fentanyl 0.5 mL (25 mcg) DOS: 03/15/2023 Performed by: Oswaldo Done, MD  Primary Purpose: Diagnostic/Therapeutic Indications: Low back pain severe enough to impact quality of life or function. 1. 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 Lumbar facet arthropathy (L3-4, L4-5, and L5-S1) (Bilateral)   4. Lumbar facet joint pain   5. Lumbar facet syndrome (Bilateral)   6. 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 Spondylosis without myelopathy or radiculopathy, lumbosacral region   8. Chronic anticoagulation (COUMADIN)   9. Morbid obesity with BMI of 40.0-44.9, adult (HCC)    NAS-11 Pain score:   Pre-procedure: 10-Worst pain ever/10    Post-procedure: 0-No pain/10     Position / Prep / Materials:  Position: Prone  Prep solution: ChloraPrep (2% chlorhexidine gluconate and 70% isopropyl alcohol) Area Prepped: Posterolateral Lumbosacral Spine (Wide prep: From the lower border of the scapula down to the end of the tailbone and from flank to flank.)  Materials:  Tray: Block Needle(s):  Type: Spinal  Gauge (G): 22  Length: 5-in Qty: 4      H&P (Pre-op Assessment):  Kara [name] is a 72 y.o. (year old), female patient, seen today for interventional treatment. She  has a past surgical history that includes Knee surgery; Carpal tunnel release (Bilateral); Colon resection sigmoid (N/A, 11/02/2016); Colostomy (N/A, 11/02/2016); Sigmoidoscopy (N/A, 11/13/2016); PULMONARY VENOGRAPHY (N/A, 11/19/2016); Colonoscopy with propofol (N/A, 02/03/2017); Colon surgery (11/02/2016); IVC FILTER INSERTION (Right, 10/2016); Colostomy reversal (N/A, 02/24/2017); Appendectomy (N/A, 02/24/2017); Lysis of adhesion (N/A, 02/24/2017); laparoscopy (N/A, 02/24/2017); Ileo loop diversion (N/A, 02/24/2017); Sigmoidoscopy (N/A, 05/19/2017); Ileostomy closure (N/A, 06/01/2017); IVC FILTER REMOVAL (N/A, 08/09/2017); Sacroplasty (N/A, 11/08/2017); Cataract extraction w/PHACO (Left, 11/21/2018); Cataract extraction w/PHACO (Right, 12/12/2018); and Video bronchoscopy with endobronchial navigation (N/A, 02/23/2021). Kara [name] has a current medication list which includes the following prescription(s): candesartan, cholecalciferol, citalopram, vitamin b-12, cyclobenzaprine, dulaglutide, gabapentin, gabapentin, glipizide, hydrocodone-acetaminophen, magnesium oxide -mg supplement, meclizine, oxybutynin, ropinirole, warfarin, and calcium carbonate, and the following Facility-Administered Medications: fentanyl. Her primarily concern today is the Back Pain (lower)  Initial Vital Signs:  Pulse/HCG Rate: 91ECG Heart Rate: 87 Temp: 97.7 F (36.5 C) Resp: 16 BP: 137/80 SpO2: 97  %  BMI: Estimated body mass index is 35.78 kg/m as calculated from the following:   Height as of this encounter: 5\' 3"  (1.6 m).   Weight as of this encounter: 202 lb (91.6 kg).  Risk Assessment: Allergies: Reviewed. She is allergic to adhesive [tape], other, latex, and morphine and  codeine.  Allergy Precautions: None required Coagulopathies: Reviewed. None identified.  Blood-thinner therapy: None at this time Active Infection(s): Reviewed. None identified. Kara [name] is afebrile  Site Confirmation: Kara [name] was asked to confirm the procedure and laterality before marking the site Procedure checklist: Completed Consent: Before the procedure and under the influence of no sedative(s), amnesic(s), or anxiolytics, the patient was informed of the treatment options, risks and possible complications. To fulfill our ethical and legal obligations, as recommended by the American Medical Association's Code of Ethics, I have informed the patient of my clinical impression; the nature and purpose of the treatment or procedure; the risks, benefits, and possible complications of the intervention; the alternatives, including doing nothing; the risk(s) and benefit(s) of the alternative treatment(s) or procedure(s); and the risk(s) and benefit(s) of doing nothing. The patient was provided information about the general risks and possible complications associated with the procedure. These may include, but are not limited to: failure to achieve desired goals, infection, bleeding, organ or nerve damage, allergic reactions, paralysis, and death. In addition, the patient was informed of those risks and complications associated to Spine-related procedures, such as failure to decrease pain; infection (i.e.: Meningitis, epidural or intraspinal abscess); bleeding (i.e.: epidural hematoma, subarachnoid hemorrhage, or any other type of intraspinal or peri-dural bleeding); organ or nerve damage (i.e.: Any type of peripheral  nerve, nerve root, or spinal cord injury) with subsequent damage to sensory, motor, and/or autonomic systems, resulting in permanent pain, numbness, and/or weakness of one or several areas of the body; allergic reactions; (i.e.: anaphylactic reaction); and/or death. Furthermore, the patient was informed of those risks and complications associated with the medications. These include, but are not limited to: allergic reactions (i.e.: anaphylactic or anaphylactoid reaction(s)); adrenal axis suppression; blood sugar elevation that in diabetics may result in ketoacidosis or comma; water retention that in patients with history of congestive heart failure may result in shortness of breath, pulmonary edema, and decompensation with resultant heart failure; weight gain; swelling or edema; medication-induced neural toxicity; particulate matter embolism and blood vessel occlusion with resultant organ, and/or nervous system infarction; and/or aseptic necrosis of one or more joints. Finally, the patient was informed that Medicine is not an exact science; therefore, there is also the possibility of unforeseen or unpredictable risks and/or possible complications that may result in a catastrophic outcome. The patient indicated having understood very clearly. We have given the patient no guarantees and we have made no promises. Enough time was given to the patient to ask questions, all of which were answered to the patient's satisfaction. Ms. Weishuhn has indicated that she wanted to continue with the procedure. Attestation: I, the ordering provider, attest that I have discussed with the patient the benefits, risks, side-effects, alternatives, likelihood of achieving goals, and potential problems during recovery for the procedure that I have provided informed consent. Date  Time: 03/15/2023  8:03 AM   Pre-Procedure Preparation:  Monitoring: As per clinic protocol. Respiration, ETCO2, SpO2, BP, heart rate and rhythm monitor  placed and checked for adequate function Safety Precautions: Patient was assessed for positional comfort and pressure points before starting the procedure. Time-out: I initiated and conducted the "Time-out" before starting the procedure, as per protocol. The patient was asked to participate by confirming the accuracy of the "Time Out" information. Verification of the correct person, site, and procedure were performed and confirmed by me, the nursing staff, and the patient. "Time-out" conducted as per Joint Commission's Universal Protocol (UP.01.01.01). Time: 0839 Start Time: 0839 hrs.  Description of Procedure:          Laterality: (see above) Targeted Levels: (see above)  Safety Precautions: Aspiration looking for blood return was conducted prior to all injections. At no point did we inject any substances, as a needle was being advanced. Before injecting, the patient was told to immediately notify me if she was experiencing any new onset of "ringing in the ears, or metallic taste in the mouth". No attempts were made at seeking any paresthesias. Safe injection practices and needle disposal techniques used. Medications properly checked for expiration dates. SDV (single dose vial) medications used. After the completion of the procedure, all disposable equipment used was discarded in the proper designated medical waste containers. Local Anesthesia: Protocol guidelines were followed. The patient was positioned over the fluoroscopy table. The area was prepped in the usual manner. The time-out was completed. The target area was identified using fluoroscopy. A 12-in long, straight, sterile hemostat was used with fluoroscopic guidance to locate the targets for each level blocked. Once located, the skin was marked with an approved surgical skin marker. Once all sites were marked, the skin (epidermis, dermis, and hypodermis), as well as deeper tissues (fat, connective tissue and muscle) were infiltrated with a  small amount of a short-acting local anesthetic, loaded on a 10cc syringe with a 25G, 1.5-in  Needle. An appropriate amount of time was allowed for local anesthetics to take effect before proceeding to the next step. Local Anesthetic: Lidocaine 2.0% The unused portion of the local anesthetic was discarded in the proper designated containers. Technical description of process:  L2 Medial Branch Nerve Block (MBB): The target area for the L2 medial branch is at the junction of the postero-lateral aspect of the superior articular process and the superior, posterior, and medial edge of the transverse process of L3. Under fluoroscopic guidance, a Quincke needle was inserted until contact was made with os over the superior postero-lateral aspect of the pedicular shadow (target area). After negative aspiration for blood, 0.5 mL of the nerve block solution was injected without difficulty or complication. The needle was removed intact. L3 Medial Branch Nerve Block (MBB): The target area for the L3 medial branch is at the junction of the postero-lateral aspect of the superior articular process and the superior, posterior, and medial edge of the transverse process of L4. Under fluoroscopic guidance, a Quincke needle was inserted until contact was made with os over the superior postero-lateral aspect of the pedicular shadow (target area). After negative aspiration for blood, 0.5 mL of the nerve block solution was injected without difficulty or complication. The needle was removed intact. L4 Medial Branch Nerve Block (MBB): The target area for the L4 medial branch is at the junction of the postero-lateral aspect of the superior articular process and the superior, posterior, and medial edge of the transverse process of L5. Under fluoroscopic guidance, a Quincke needle was inserted until contact was made with os over the superior postero-lateral aspect of the pedicular shadow (target area). After negative aspiration for blood,  0.5 mL of the nerve block solution was injected without difficulty or complication. The needle was removed intact. L5 Medial Branch Nerve Block (MBB): The target area for the L5 medial branch is at the junction of the postero-lateral aspect of the superior articular process and the superior, posterior, and medial edge of the sacral ala. Under fluoroscopic guidance, a Quincke needle was inserted until contact was made with os over the superior postero-lateral aspect of the pedicular shadow (target area).  After negative aspiration for blood, 0.5 mL of the nerve block solution was injected without difficulty or complication. The needle was removed intact. S1 Medial Branch Nerve Block (MBB): The target area for the S1 medial branch is at the posterior and inferior 6 o'clock position of the L5-S1 facet joint. Under fluoroscopic guidance, the Quincke needle inserted for the L5 MBB was redirected until contact was made with os over the inferior and postero aspect of the sacrum, at the 6 o' clock position under the L5-S1 facet joint (Target area). After negative aspiration for blood, 0.5 mL of the nerve block solution was injected without difficulty or complication. The needle was removed intact.  Once the entire procedure was completed, the treated area was cleaned, making sure to leave some of the prepping solution back to take advantage of its long term bactericidal properties.         Illustration of the posterior view of the lumbar spine and the posterior neural structures. Laminae of L2 through S1 are labeled. DPRL5, dorsal primary ramus of L5; DPRS1, dorsal primary ramus of S1; DPR3, dorsal primary ramus of L3; FJ, facet (zygapophyseal) joint L3-L4; I, inferior articular process of L4; LB1, lateral branch of dorsal primary ramus of L1; IAB, inferior articular branches from L3 medial branch (supplies L4-L5 facet joint); IBP, intermediate branch plexus; MB3, medial branch of dorsal primary ramus of L3; NR3,  third lumbar nerve root; S, superior articular process of L5; SAB, superior articular branches from L4 (supplies L4-5 facet joint also); TP3, transverse process of L3.   Facet Joint Innervation (* possible contribution)  L1-2 T12, L1 (L2*)  Medial Branch  L2-3 L1, L2 (L3*)         "          "  L3-4 L2, L3 (L4*)         "          "  L4-5 L3, L4 (L5*)         "          "  L5-S1 L4, L5, S1          "          "    Vitals:   03/15/23 0851 03/15/23 0859 03/15/23 0909 03/15/23 0919  BP: 124/80 133/81 130/77 (!) 147/79  Pulse:      Resp: 16 10 (!) 23 19  Temp:  (!) 97.5 F (36.4 C)  97.8 F (36.6 C)  TempSrc:  Temporal  Temporal  SpO2: 100% 96% 94% 97%  Weight:      Height:         End Time: 0851 hrs.  Imaging Guidance (Spinal):          Type of Imaging Technique: Fluoroscopy Guidance (Spinal) Indication(s): Assistance in needle guidance and placement for procedures requiring needle placement in or near specific anatomical locations not easily accessible without such assistance. Exposure Time: Please see nurses notes. Contrast: None used. Fluoroscopic Guidance: I was personally present during the use of fluoroscopy. "Tunnel Vision Technique" used to obtain the best possible view of the target area. Parallax error corrected before commencing the procedure. "Direction-depth-direction" technique used to introduce the needle under continuous pulsed fluoroscopy. Once target was reached, antero-posterior, oblique, and lateral fluoroscopic projection used confirm needle placement in all planes. Images permanently stored in EMR. Interpretation: No contrast injected. I personally interpreted the imaging intraoperatively. Adequate needle placement confirmed in multiple planes. Permanent images saved into the patient's record.  Post-operative Assessment:  Post-procedure Vital Signs:  Pulse/HCG Rate: 9189 Temp: 97.8 F (36.6 C) Resp: 19 BP: (!) 147/79 SpO2: 97 %  EBL:  None  Complications: No immediate post-treatment complications observed by team, or reported by patient.  Note: The patient tolerated the entire procedure well. A repeat set of vitals were taken after the procedure and the patient was kept under observation following institutional policy, for this type of procedure. Post-procedural neurological assessment was performed, showing return to baseline, prior to discharge. The patient was provided with post-procedure discharge instructions, including a section on how to identify potential problems. Should any problems arise concerning this procedure, the patient was given instructions to immediately contact us, at any time, without hesitation. In any case, we plan to contact the patient by telephone for a follow-up status report regarding this interventional procedure.  Comments:  No additional relevant information.  Plan of Care (POC)  Orders:  Orders Placed This Encounter  Procedures   LUMBAR FACET(MEDIAL BRANCH NERVE BLOCK) MBNB    Scheduling Instructions:     Procedure: Lumbar facet block (AKA.: Lumbosacral medial branch nerve block)     Side: Bilateral     Level: L3-4, L4-5, L5-S1, and TBD Facets (L2, L3, L4, L5, S1, and TBD Medial Branch Nerves)     Sedation: Patient's choice.     Timeframe: Today    Order Specific Question:   Where will this procedure be performed?    Answer:   [name]   DG PAIN [facility] C-ARM 1-60 MIN NO REPORT    Intraoperative interpretation by procedural physician at [facility].    Standing Status:   Standing    Number of Occurrences:   1    Order Specific Question:   Reason for exam:    Answer:   Assistance in needle guidance and placement for procedures requiring needle placement in or near specific anatomical locations not easily accessible without such assistance.   Informed Consent Details: Physician/Practitioner Attestation; Transcribe to consent form and obtain patient signature     Nursing Order: Transcribe to consent form and obtain patient signature. Note: Always confirm laterality of pain with Ms. Vanover, before procedure.    Order Specific Question:   Physician/Practitioner attestation of informed consent for procedure/surgical case    Answer:   I, the physician/practitioner, attest that I have discussed with the patient the benefits, risks, side effects, alternatives, likelihood of achieving goals and potential problems during recovery for the procedure that I have provided informed consent.    Order Specific Question:   Procedure    Answer:   Lumbar Facet Block  under fluoroscopic guidance    Order Specific Question:   Physician/Practitioner performing the procedure    Answer:   Rubel Heckard A. Laban Emperor MD    Order Specific Question:   Indication/Reason    Answer:   Low Back Pain, with our without leg pain, due to Facet Joint Arthralgia (Joint Pain) Spondylosis (Arthritis of the Spine), without myelopathy or radiculopathy (Nerve Damage).   Care order/instruction: Please confirm that the patient has stopped the Coumadin (Warfarin) X 5 days prior to procedure or surgery.    Please confirm that the patient has stopped the Coumadin (Warfarin) X 5 days prior to procedure or surgery.    Standing Status:   Standing    Number of Occurrences:   1   Provide equipment / supplies at bedside    Procedure tray: "Block Tray" (Disposable  single use) Skin infiltration needle: Regular 1.5-in,  25-G, (x1) Block Needle type: Spinal Amount/quantity: 4 Size: Medium (5-inch) Gauge: 22G    Standing Status:   Standing    Number of Occurrences:   1    Order Specific Question:   Specify    Answer:   Block Tray   Bleeding precautions    Standing Status:   Standing    Number of Occurrences:   1   Latex precautions    Activate Latex-Free Protocol.    Standing Status:   Standing    Number of Occurrences:   1   Chronic Opioid Analgesic:  No chronic opioid analgesics therapy prescribed by  our practice, 2ry to Medication Agreement Violation. Oxycodone IR 10 mg, 1 tab PO qd,  from PCP.  (Getting oxycodone from PCP in multiple occasions, according to PMP)(11/21/20; 12/26/20; 01/16/21; 02/13/21). Opioid analgesic pharmacotherapy D/C'ed on 03/03/2021. MME: 20 mg/day.   Medications ordered for procedure: Meds ordered this encounter  Medications   lidocaine (XYLOCAINE) 2 % (with pres) injection 400 mg   pentafluoroprop-tetrafluoroeth (GEBAUERS) aerosol   lactated ringers infusion   midazolam (VERSED) 5 MG/5ML injection 0.5-2 mg    Make sure Flumazenil is available in the pyxis when using this medication. If oversedation occurs, administer 0.2 mg IV over 15 sec. If after 45 sec no response, administer 0.2 mg again over 1 min; may repeat at 1 min intervals; not to exceed 4 doses (1 mg)   fentaNYL (SUBLIMAZE) injection 25-50 mcg    Make sure Narcan is available in the pyxis when using this medication. In the event of respiratory depression (RR< 8/min): Titrate NARCAN (naloxone) in increments of 0.1 to 0.2 mg IV at 2-3 minute intervals, until desired degree of reversal.   ropivacaine (PF) 2 mg/mL (0.2%) (NAROPIN) injection 18 mL   triamcinolone acetonide (KENALOG-40) injection 80 mg   Medications administered: We administered lidocaine, pentafluoroprop-tetrafluoroeth, lactated ringers, midazolam, fentaNYL, ropivacaine (PF) 2 mg/mL (0.2%), and triamcinolone acetonide.  See the medical record for exact dosing, route, and time of administration.  Follow-up plan:   Return in about 2 weeks (around 03/29/2023) for (Face2F), (PPE).       Interventional Therapies  Risk  Complexity Considerations:   ANTICOAGULATION: Coumadin (Stop: 5 days  Re-start: 2hrs)  Hx DVT & PE ALLERGY: Latex Morbid obesity (class III) (BMI>40)  metabolic syndrome  NIDDM  HTN Difficult Lumbar RFA due to morbid obesity    Planned  Pending: Palliative bilateral (L3-4, L4-5, L5-S1) lumbar facet (L2-S1) MBB [id number]  (03/15/2023)    Under consideration:   Therapeutic bilateral lumbar facet RFA #2 (patient indicates preferring to have only the block without radiofrequency.)   Completed:   Therapeutic bilateral lumbar facet MBB (L3-S1) x1 (04/06/2022) (80/80/90/90)  Diagnostic/therapeutic right IA hip inj. x2 (01/28/2022) (100/100/85/85)  Diagnostic/therapeutic left IA hip inj. x1 (01/28/2022) (100/100/85/85)  Diagnostic/therapeutic right trochanteric bursa inj. x1 (09/15/2021) (85/85/85)  Diagnostic/therapeutic left trochanteric bursa inj. x1 (01/28/2022) (100/100/85/85)  Therapeutic left lumbar facet RFA x1 (07/21/2021) (100/100/100 x1 week/80) (Lasted 9 months)  Therapeutic right lumbar facet RFA x1 (07/07/2021) (100/100/80/80) (Lasted 9 months)  Palliative/Therapeutic left L4-5 LESI x1 (11/10/2017) (100/80/0/0-25)  Diagnostic/therapeutic right L4 TFESI x1 (11/19/2021) (90/80/0/0) (fell at [employer]) Palliative left lumbar facet (L2-S1) MBB x20 (12/07/2022) (90/80/30/30)  Palliative right lumbar facet (L2-S1) MBB x27 (12/07/2022) (90/80/30/30)  Palliative right SI joint block x9 (01/28/2022) (100/100/80/80)  Diagnostic left SI joint block x2 (01/28/2022) (100/100/80/80)  Palliative/Therapeutic (Midline) caudal ESI x3 (06/08/2018) (100/100/100 x 2-3 days/0)    Therapeutic  Palliative (  PRN) options:   NO PRN procedures without F2F exam by Dr. Laban Emperor due to multifactorial etiology of chronic pain. No more procedures until after 08/14/2022.    Pharmacotherapy  Nonopioids transferred 03/27/2020: Calcium        Recent Visits Date Type Provider Dept  03/03/23 Office Visit Delano Metz, MD [name]  12/28/22 Office Visit Delano Metz, MD [name]  Showing recent visits within past 90 days and meeting all other requirements Today's Visits Date Type Provider Dept  03/15/23 Procedure visit Delano Metz, MD [name]  Showing today's visits and meeting  all other requirements Future Appointments Date Type Provider Dept  04/05/23 Appointment Delano Metz, MD [name]  Showing future appointments within next 90 days and meeting all other requirements  Disposition: Discharge home  Discharge (Date  Time): 03/15/2023; 0921 hrs.   Primary Care Physician: Barbette Reichmann, MD Location: [facility] Note by: Oswaldo Done, MD (TTS technology used. I apologize for any typographical errors that were not detected and corrected.) Date: 03/15/2023; Time: 10:44 AM  Disclaimer:  Medicine is not an Visual merchandiser. The only guarantee in medicine is that nothing is guaranteed. It is important to note that the decision to proceed with this intervention was based on the information collected from the patient. The Data and conclusions were drawn from the patient's questionnaire, the interview, and the physical examination. Because the information was provided in large part by the patient, it cannot be guaranteed that it has not been purposely or unconsciously manipulated. Every effort has been made to obtain as much relevant data as possible for this evaluation. It is important to note that the conclusions that lead to this procedure are derived in large part from the available data. Always take into account that the treatment will also be dependent on availability of resources and existing treatment guidelines, considered by other Pain Management Practitioners as being common knowledge and practice, at the time of the intervention. For Medico-Legal purposes, it is also important to point out that variation in procedural techniques and pharmacological choices are the acceptable norm. The indications, contraindications, technique, and results of the above procedure should only be interpreted and judged by a Board-Certified Interventional Pain Specialist with extensive familiarity and expertise in the same exact  procedure and technique.

## 2023-03-15 ENCOUNTER — Ambulatory Visit
Admission: RE | Admit: 2023-03-15 | Discharge: 2023-03-15 | Disposition: A | Discharge status: Home / Self Care | Payer: PPO | Source: Ambulatory Visit | Attending: Pain Medicine | Admitting: Pain Medicine

## 2023-03-15 ENCOUNTER — Encounter: Payer: Self-pay | Admitting: Pain Medicine

## 2023-03-15 ENCOUNTER — Ambulatory Visit: Payer: PPO | Attending: Pain Medicine | Admitting: Pain Medicine

## 2023-03-15 VITALS — BP 147/79 | HR 91 | Temp 97.8°F | Resp 19 | Ht 63.0 in | Wt 202.0 lb

## 2023-03-15 DIAGNOSIS — M5442 Lumbago with sciatica, left side: Secondary | ICD-10-CM | POA: Insufficient documentation

## 2023-03-15 DIAGNOSIS — M4310 Spondylolisthesis, site unspecified: Secondary | ICD-10-CM

## 2023-03-15 DIAGNOSIS — Z6841 Body Mass Index (BMI) 40.0 and over, adult: Secondary | ICD-10-CM | POA: Insufficient documentation

## 2023-03-15 DIAGNOSIS — M4306 Spondylolysis, lumbar region: Secondary | ICD-10-CM | POA: Diagnosis not present

## 2023-03-15 DIAGNOSIS — Z91040 Latex allergy status: Secondary | ICD-10-CM | POA: Diagnosis not present

## 2023-03-15 DIAGNOSIS — M5441 Lumbago with sciatica, right side: Secondary | ICD-10-CM | POA: Insufficient documentation

## 2023-03-15 DIAGNOSIS — Z7901 Long term (current) use of anticoagulants: Secondary | ICD-10-CM | POA: Diagnosis not present

## 2023-03-15 DIAGNOSIS — M47816 Spondylosis without myelopathy or radiculopathy, lumbar region: Secondary | ICD-10-CM

## 2023-03-15 DIAGNOSIS — M47817 Spondylosis without myelopathy or radiculopathy, lumbosacral region: Secondary | ICD-10-CM

## 2023-03-15 DIAGNOSIS — M5459 Other low back pain: Secondary | ICD-10-CM | POA: Diagnosis not present

## 2023-03-15 DIAGNOSIS — G8929 Other chronic pain: Secondary | ICD-10-CM

## 2023-03-15 MED ORDER — MIDAZOLAM HCL 5 MG/5ML IJ SOLN
INTRAMUSCULAR | Status: AC
  Filled 2023-03-15: qty 5

## 2023-03-15 MED ORDER — ROPIVACAINE HCL 2 MG/ML IJ SOLN
18.0000 mL | Freq: Once | INTRAMUSCULAR | Status: AC
  Administered 2023-03-15: 18 mL via PERINEURAL

## 2023-03-15 MED ORDER — TRIAMCINOLONE ACETONIDE 40 MG/ML IJ SUSP
INTRAMUSCULAR | Status: AC
  Filled 2023-03-15: qty 2

## 2023-03-15 MED ORDER — TRIAMCINOLONE ACETONIDE 40 MG/ML IJ SUSP
80.0000 mg | Freq: Once | INTRAMUSCULAR | Status: AC
  Administered 2023-03-15: 80 mg

## 2023-03-15 MED ORDER — PENTAFLUOROPROP-TETRAFLUOROETH EX AERO
INHALATION_SPRAY | Freq: Once | CUTANEOUS | Status: AC
  Administered 2023-03-15: 30 via TOPICAL
  Filled 2023-03-15: qty 30

## 2023-03-15 MED ORDER — FENTANYL CITRATE (PF) 100 MCG/2ML IJ SOLN
25.0000 ug | INTRAMUSCULAR | Status: DC | PRN
  Administered 2023-03-15: 25 ug via INTRAVENOUS

## 2023-03-15 MED ORDER — ROPIVACAINE HCL 2 MG/ML IJ SOLN
INTRAMUSCULAR | Status: AC
  Filled 2023-03-15: qty 20

## 2023-03-15 MED ORDER — MIDAZOLAM HCL 5 MG/5ML IJ SOLN
0.5000 mg | Freq: Once | INTRAMUSCULAR | Status: AC
  Administered 2023-03-15: 2 mg via INTRAVENOUS

## 2023-03-15 MED ORDER — FENTANYL CITRATE (PF) 100 MCG/2ML IJ SOLN
INTRAMUSCULAR | Status: AC
  Filled 2023-03-15: qty 2

## 2023-03-15 MED ORDER — LIDOCAINE HCL 2 % IJ SOLN
INTRAMUSCULAR | Status: AC
  Filled 2023-03-15: qty 20

## 2023-03-15 MED ORDER — LIDOCAINE HCL 2 % IJ SOLN
20.0000 mL | Freq: Once | INTRAMUSCULAR | Status: AC
  Administered 2023-03-15: 400 mg

## 2023-03-15 MED ORDER — LACTATED RINGERS IV SOLN: Freq: Once | INTRAVENOUS | Status: AC

## 2023-03-15 NOTE — Patient Instructions (Signed)

## 2023-03-15 NOTE — Progress Notes (Signed)
Safety precautions to be maintained throughout the outpatient stay will include: orient to surroundings, keep bed in low position, maintain call bell within reach at all times, provide assistance with transfer out of bed and ambulation.  

## 2023-03-16 ENCOUNTER — Telehealth: Payer: Self-pay

## 2023-03-16 NOTE — Telephone Encounter (Signed)
Post procedure follow up.  LM 

## 2023-03-17 ENCOUNTER — Ambulatory Visit: Payer: PPO | Admitting: Pain Medicine

## 2023-03-20 ENCOUNTER — Inpatient Hospital Stay
Admission: EM | Admit: 2023-03-20 | Discharge: 2023-03-22 | DRG: 389 | Disposition: A | Discharge status: Home / Self Care | Payer: PPO | Attending: Student | Admitting: Student

## 2023-03-20 ENCOUNTER — Other Ambulatory Visit: Payer: Self-pay

## 2023-03-20 ENCOUNTER — Emergency Department: Payer: PPO

## 2023-03-20 ENCOUNTER — Observation Stay: Payer: PPO

## 2023-03-20 DIAGNOSIS — K56609 Unspecified intestinal obstruction, unspecified as to partial versus complete obstruction: Secondary | ICD-10-CM

## 2023-03-20 DIAGNOSIS — Z85118 Personal history of other malignant neoplasm of bronchus and lung: Secondary | ICD-10-CM

## 2023-03-20 DIAGNOSIS — R109 Unspecified abdominal pain: Secondary | ICD-10-CM | POA: Diagnosis present

## 2023-03-20 DIAGNOSIS — Z803 Family history of malignant neoplasm of breast: Secondary | ICD-10-CM

## 2023-03-20 DIAGNOSIS — Z7984 Long term (current) use of oral hypoglycemic drugs: Secondary | ICD-10-CM

## 2023-03-20 DIAGNOSIS — Q600 Renal agenesis, unilateral: Secondary | ICD-10-CM | POA: Diagnosis not present

## 2023-03-20 DIAGNOSIS — Z4682 Encounter for fitting and adjustment of non-vascular catheter: Secondary | ICD-10-CM | POA: Diagnosis not present

## 2023-03-20 DIAGNOSIS — E119 Type 2 diabetes mellitus without complications: Secondary | ICD-10-CM | POA: Diagnosis present

## 2023-03-20 DIAGNOSIS — J8410 Pulmonary fibrosis, unspecified: Secondary | ICD-10-CM | POA: Diagnosis present

## 2023-03-20 DIAGNOSIS — D72829 Elevated white blood cell count, unspecified: Secondary | ICD-10-CM | POA: Diagnosis present

## 2023-03-20 DIAGNOSIS — I110 Hypertensive heart disease with heart failure: Secondary | ICD-10-CM | POA: Diagnosis present

## 2023-03-20 DIAGNOSIS — K439 Ventral hernia without obstruction or gangrene: Secondary | ICD-10-CM | POA: Diagnosis not present

## 2023-03-20 DIAGNOSIS — I5032 Chronic diastolic (congestive) heart failure: Secondary | ICD-10-CM | POA: Diagnosis present

## 2023-03-20 DIAGNOSIS — Z91040 Latex allergy status: Secondary | ICD-10-CM

## 2023-03-20 DIAGNOSIS — E66812 Obesity, class 2: Secondary | ICD-10-CM | POA: Diagnosis present

## 2023-03-20 DIAGNOSIS — R110 Nausea: Secondary | ICD-10-CM | POA: Diagnosis not present

## 2023-03-20 DIAGNOSIS — Z79899 Other long term (current) drug therapy: Secondary | ICD-10-CM

## 2023-03-20 DIAGNOSIS — Z923 Personal history of irradiation: Secondary | ICD-10-CM

## 2023-03-20 DIAGNOSIS — E669 Obesity, unspecified: Secondary | ICD-10-CM | POA: Diagnosis present

## 2023-03-20 DIAGNOSIS — I10 Essential (primary) hypertension: Secondary | ICD-10-CM | POA: Diagnosis present

## 2023-03-20 DIAGNOSIS — R079 Chest pain, unspecified: Secondary | ICD-10-CM | POA: Diagnosis present

## 2023-03-20 DIAGNOSIS — K565 Intestinal adhesions [bands], unspecified as to partial versus complete obstruction: Secondary | ICD-10-CM | POA: Diagnosis not present

## 2023-03-20 DIAGNOSIS — K567 Ileus, unspecified: Secondary | ICD-10-CM | POA: Diagnosis present

## 2023-03-20 DIAGNOSIS — R1084 Generalized abdominal pain: Secondary | ICD-10-CM

## 2023-03-20 DIAGNOSIS — I2699 Other pulmonary embolism without acute cor pulmonale: Secondary | ICD-10-CM | POA: Diagnosis present

## 2023-03-20 DIAGNOSIS — R918 Other nonspecific abnormal finding of lung field: Secondary | ICD-10-CM | POA: Diagnosis not present

## 2023-03-20 DIAGNOSIS — Z452 Encounter for adjustment and management of vascular access device: Secondary | ICD-10-CM | POA: Diagnosis not present

## 2023-03-20 DIAGNOSIS — Z6836 Body mass index (BMI) 36.0-36.9, adult: Secondary | ICD-10-CM

## 2023-03-20 DIAGNOSIS — M810 Age-related osteoporosis without current pathological fracture: Secondary | ICD-10-CM | POA: Diagnosis present

## 2023-03-20 DIAGNOSIS — Z86711 Personal history of pulmonary embolism: Secondary | ICD-10-CM

## 2023-03-20 DIAGNOSIS — Z8249 Family history of ischemic heart disease and other diseases of the circulatory system: Secondary | ICD-10-CM

## 2023-03-20 DIAGNOSIS — R1111 Vomiting without nausea: Secondary | ICD-10-CM | POA: Diagnosis not present

## 2023-03-20 DIAGNOSIS — Z833 Family history of diabetes mellitus: Secondary | ICD-10-CM

## 2023-03-20 DIAGNOSIS — K8020 Calculus of gallbladder without cholecystitis without obstruction: Secondary | ICD-10-CM | POA: Diagnosis not present

## 2023-03-20 DIAGNOSIS — Z91048 Other nonmedicinal substance allergy status: Secondary | ICD-10-CM

## 2023-03-20 DIAGNOSIS — Z7901 Long term (current) use of anticoagulants: Secondary | ICD-10-CM

## 2023-03-20 DIAGNOSIS — E1169 Type 2 diabetes mellitus with other specified complication: Secondary | ICD-10-CM | POA: Diagnosis present

## 2023-03-20 DIAGNOSIS — Z885 Allergy status to narcotic agent status: Secondary | ICD-10-CM

## 2023-03-20 HISTORY — DX: Unspecified intestinal obstruction, unspecified as to partial versus complete obstruction: K56.609

## 2023-03-20 LAB — HEPATIC FUNCTION PANEL
ALT: 31 U/L (ref 0–44)
AST: 21 U/L (ref 15–41)
Albumin: 4.1 g/dL (ref 3.5–5.0)
Alkaline Phosphatase: 50 U/L (ref 38–126)
Bilirubin, Direct: 0.2 mg/dL (ref 0.0–0.2)
Indirect Bilirubin: 1 mg/dL — ABNORMAL HIGH (ref 0.3–0.9)
Total Bilirubin: 1.2 mg/dL (ref 0.3–1.2)
Total Protein: 7.7 g/dL (ref 6.5–8.1)

## 2023-03-20 LAB — URINALYSIS, COMPLETE (UACMP) WITH MICROSCOPIC
Bilirubin Urine: NEGATIVE
Glucose, UA: >=500 mg/dL — AB
Hgb urine dipstick: NEGATIVE
Ketones, ur: 5 mg/dL — AB
Nitrite: NEGATIVE
Protein, ur: NEGATIVE mg/dL
Specific Gravity, Urine: >1.046 — ABNORMAL HIGH (ref 1.005–1.030)
pH: 5 (ref 5.0–8.0)

## 2023-03-20 LAB — PROTIME-INR
INR: 1.2 (ref 0.8–1.2)
Prothrombin Time: 15.8 s — ABNORMAL HIGH (ref 11.4–15.2)

## 2023-03-20 LAB — CBC
HCT: 44.2 % (ref 36.0–46.0)
Hemoglobin: 14.7 g/dL (ref 12.0–15.0)
MCH: 28.8 pg (ref 26.0–34.0)
MCHC: 33.3 g/dL (ref 30.0–36.0)
MCV: 86.5 fL (ref 80.0–100.0)
Platelets: 290 10*3/uL (ref 150–400)
RBC: 5.11 MIL/uL (ref 3.87–5.11)
RDW: 14.2 % (ref 11.5–15.5)
WBC: 16.2 10*3/uL — ABNORMAL HIGH (ref 4.0–10.5)
nRBC: 0 % (ref 0.0–0.2)

## 2023-03-20 LAB — BASIC METABOLIC PANEL
Anion gap: 11 (ref 5–15)
BUN: 20 mg/dL (ref 8–23)
CO2: 21 mmol/L — ABNORMAL LOW (ref 22–32)
Calcium: 9.7 mg/dL (ref 8.9–10.3)
Chloride: 103 mmol/L (ref 98–111)
Creatinine, Ser: 0.78 mg/dL (ref 0.44–1.00)
GFR, Estimated: >60 mL/min (ref >60)
Glucose, Bld: 211 mg/dL — ABNORMAL HIGH (ref 70–99)
Potassium: 4.2 mmol/L (ref 3.5–5.1)
Sodium: 135 mmol/L (ref 135–145)

## 2023-03-20 LAB — TROPONIN I (HIGH SENSITIVITY)
Troponin I (High Sensitivity): 4 ng/L (ref <18)
Troponin I (High Sensitivity): 4 ng/L (ref <18)

## 2023-03-20 LAB — LIPASE, BLOOD: Lipase: 34 U/L (ref 11–51)

## 2023-03-20 MED ORDER — LACTATED RINGERS IV SOLN: INTRAVENOUS | Status: AC

## 2023-03-20 MED ORDER — ENOXAPARIN SODIUM 100 MG/ML IJ SOSY
1.0000 mg/kg | PREFILLED_SYRINGE | Freq: Two times a day (BID) | INTRAMUSCULAR | Status: DC
  Administered 2023-03-20 – 2023-03-21 (×2): 90 mg via SUBCUTANEOUS
  Filled 2023-03-20 (×2): qty 1

## 2023-03-20 MED ORDER — CITALOPRAM HYDROBROMIDE 20 MG PO TABS: 10.0000 mg | ORAL_TABLET | Freq: Every day | ORAL | Status: DC

## 2023-03-20 MED ORDER — ONDANSETRON HCL 4 MG/2ML IJ SOLN
4.0000 mg | Freq: Once | INTRAMUSCULAR | Status: AC
  Administered 2023-03-20: 4 mg via INTRAVENOUS
  Filled 2023-03-20: qty 2

## 2023-03-20 MED ORDER — ROPINIROLE HCL 1 MG PO TABS
0.5000 mg | ORAL_TABLET | Freq: Every day | ORAL | Status: DC
  Administered 2023-03-20: 0.5 mg via ORAL
  Filled 2023-03-20: qty 2

## 2023-03-20 MED ORDER — IRBESARTAN 150 MG PO TABS
150.0000 mg | ORAL_TABLET | Freq: Every day | ORAL | Status: DC
  Administered 2023-03-21: 150 mg via ORAL
  Filled 2023-03-20: qty 1

## 2023-03-20 MED ORDER — IOHEXOL 350 MG/ML SOLN
100.0000 mL | Freq: Once | INTRAVENOUS | Status: AC | PRN
  Administered 2023-03-20: 100 mL via INTRAVENOUS

## 2023-03-20 MED ORDER — INSULIN ASPART 100 UNIT/ML IJ SOLN
0.0000 [IU] | INTRAMUSCULAR | Status: DC | PRN
  Administered 2023-03-21: 1 [IU] via SUBCUTANEOUS
  Administered 2023-03-21: 2 [IU] via SUBCUTANEOUS
  Administered 2023-03-21: 1 [IU] via SUBCUTANEOUS
  Filled 2023-03-20 (×3): qty 1

## 2023-03-20 MED ORDER — FENTANYL CITRATE PF 50 MCG/ML IJ SOSY
50.0000 ug | PREFILLED_SYRINGE | Freq: Four times a day (QID) | INTRAMUSCULAR | Status: DC | PRN
  Administered 2023-03-21: 50 ug via INTRAVENOUS
  Filled 2023-03-20: qty 1

## 2023-03-20 MED ORDER — ACETAMINOPHEN 650 MG RE SUPP: 650.0000 mg | Freq: Four times a day (QID) | RECTAL | Status: DC | PRN

## 2023-03-20 MED ORDER — SODIUM CHLORIDE 0.9% FLUSH
3.0000 mL | Freq: Two times a day (BID) | INTRAVENOUS | Status: DC
  Administered 2023-03-20 – 2023-03-22 (×4): 3 mL via INTRAVENOUS

## 2023-03-20 MED ORDER — GABAPENTIN 300 MG PO CAPS
300.0000 mg | ORAL_CAPSULE | Freq: Every day | ORAL | Status: DC
  Administered 2023-03-20: 300 mg via ORAL
  Filled 2023-03-20: qty 1

## 2023-03-20 MED ORDER — HEPARIN SODIUM (PORCINE) 5000 UNIT/ML IJ SOLN: 5000.0000 [IU] | Freq: Three times a day (TID) | INTRAMUSCULAR | Status: DC

## 2023-03-20 MED ORDER — OXYBUTYNIN CHLORIDE 5 MG PO TABS
5.0000 mg | ORAL_TABLET | Freq: Two times a day (BID) | ORAL | Status: DC
  Administered 2023-03-20 – 2023-03-21 (×2): 5 mg via ORAL
  Filled 2023-03-20 (×2): qty 1

## 2023-03-20 MED ORDER — FENTANYL CITRATE PF 50 MCG/ML IJ SOSY
75.0000 ug | PREFILLED_SYRINGE | Freq: Once | INTRAMUSCULAR | Status: AC
  Administered 2023-03-20: 75 ug via INTRAVENOUS
  Filled 2023-03-20: qty 2

## 2023-03-20 MED ORDER — ACETAMINOPHEN 325 MG PO TABS
650.0000 mg | ORAL_TABLET | Freq: Four times a day (QID) | ORAL | Status: DC | PRN
Start: 2023-03-20 — End: 2023-03-22
  Administered 2023-03-21: 650 mg via ORAL
  Filled 2023-03-20 (×2): qty 2

## 2023-03-20 MED ORDER — PANTOPRAZOLE SODIUM 40 MG IV SOLR
40.0000 mg | Freq: Two times a day (BID) | INTRAVENOUS | Status: DC
  Administered 2023-03-20 – 2023-03-22 (×4): 40 mg via INTRAVENOUS
  Filled 2023-03-20 (×4): qty 10

## 2023-03-20 NOTE — Assessment & Plan Note (Signed)
Strict I/O. Weights. Pt is euvolemic no edema or jvd/ lung are clear on auscultation.

## 2023-03-20 NOTE — Assessment & Plan Note (Signed)
Vitals:   03/20/23 1738 03/20/23 1915 03/20/23 2015  BP: (!) 161/143 (!) 150/82 (!) 158/78  PO BP meds continued.

## 2023-03-20 NOTE — ED Notes (Signed)
MD Patel at the bedside

## 2023-03-20 NOTE — Assessment & Plan Note (Signed)
TNI is negative.  EKG negative for st elevation.  We will follow. Suspect this is GI related.

## 2023-03-20 NOTE — H&P (Addendum)
History and Physical    Patient: Kara [name] WUJ:[id number] DOB: 03/27/1951 DOA: 03/20/2023 DOS: the patient was seen and examined on 03/20/2023 Oncology: Dr. Smith Robert PCP: Barbette Reichmann, MD  Patient coming from: Home Chief Complaint:  Chief Complaint  Patient presents with   Abdominal Pain   Chest Pain    HPI: Kara [name] is a 72 y.o. female with medical history significant for umbilical hernia, diabetes mellitus type 2, hypertension, history of PE, arthritis, congestive heart failure, allergies to adhesive tape latex and morphine, chlorhexidine coming to [place] today with abdominal pain and chest pain.  Patient had similar episode in July of this year.  Per report patient's symptoms started earlier today and initially started as abdominal pain and nausea radiating to the right side of the chest.  No complaints of nausea vomiting diarrhea shortness of breath palpitations fevers chills.  Initial vitals show patient's heart rate at 80 O2 sats of 99% on room air blood sugar 192 and blood pressure of 150/80 temperature of 97.7.   In emergency room vitals trend shows: Vitals:   03/20/23 1738 03/20/23 1915 03/20/23 2013 03/20/23 2015  BP: (!) 161/143 (!) 150/82  (!) 158/78  Pulse: 88   74  Temp: 97.6 F (36.4 C)   (!) 97.4 F (36.3 C)  Resp: 18   16  Height:   5\' 3"  (1.6 m)   Weight:   90.7 kg   SpO2: 100%   99%  TempSrc: Oral   Oral  BMI (Calculated):   35.44   Labs are notable for bicarb of 21 glucose 211 normal kidney function normal AST ALT and direct bilirubin of 1.0 normal troponin, leukocytosis of 16.2 normal blood count otherwise  In the emergency room patient had CTA chest abdomen and pelvis showing findings concerning for small bowel obstruction, multiple small pleural nodules around the lung base on the right side as well as the superior margin of the treated mass in the right upper lobe on previous imaging on 01/14/2023 concerning for recurrent malignancy.   Unchanged hilar lymphadenopathy and pretracheal lymphadenopathy and a midline ventral hernia with a single nonobstructed loop of transverse colon gallstones and CAD- with EKG showing sinus rhythm at 78 with significant Q waves in lead III, QTc of 462.  In the ED pt received: Medications  irbesartan (AVAPRO) tablet 150 mg (has no administration in time range)  gabapentin (NEURONTIN) capsule 300 mg (has no administration in time range)  rOPINIRole (REQUIP) tablet 0.5 mg (has no administration in time range)  oxybutynin (DITROPAN) tablet 5 mg (has no administration in time range)  sodium chloride flush (NS) 0.9 % injection 3 mL (has no administration in time range)  acetaminophen (TYLENOL) tablet 650 mg (has no administration in time range)    Or  acetaminophen (TYLENOL) suppository 650 mg (has no administration in time range)  heparin injection 5,000 Units (has no administration in time range)  lactated ringers infusion ( Intravenous Rate/Dose Change 03/20/23 2123)  insulin aspart (novoLOG) injection 0-6 Units (has no administration in time range)  fentaNYL (SUBLIMAZE) injection 50 mcg (has no administration in time range)  fentaNYL (SUBLIMAZE) injection 75 mcg (75 mcg Intravenous Given 03/20/23 1916)  ondansetron (ZOFRAN) injection 4 mg (4 mg Intravenous Given 03/20/23 1912)  iohexol (OMNIPAQUE) 350 MG/ML injection 100 mL (100 mLs Intravenous Contrast Given 03/20/23 1925)   Review of Systems  Cardiovascular:  Positive for chest pain.  Gastrointestinal:  Positive for abdominal pain.  All other systems reviewed and are  negative.  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, 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   CHF (congestive heart failure) (HCC)    followed colon resection, "wouldn't let me get up"   Diabetes (HCC)    type 2   Diverticulitis 2018   History of being hospitalized    1 month ago for 3 days, vomiting, and kink in upper  intestine   History of hiatal hernia    Hypertension    Osteoporosis    PE (pulmonary thromboembolism) (HCC)    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HCC)    Vertigo    Past Surgical History:  Procedure Laterality Date   APPENDECTOMY N/A 02/24/2017   Procedure: Incidental  APPENDECTOMY;  Surgeon: Ricarda Frame, MD;  Location: [name];  Service: General;  Laterality: N/A;   CARPAL TUNNEL RELEASE Bilateral    CATARACT EXTRACTION W/PHACO Left 11/21/2018   Procedure: CATARACT EXTRACTION PHACO AND INTRAOCULAR LENS PLACEMENT (IOC) LEFT DIABETES;  Surgeon: Galen Manila, MD;  Location: [facility];  Service: Ophthalmology;  Laterality: Left;  latex sensitivity Diabetic - oral meds   CATARACT EXTRACTION W/PHACO Right 12/12/2018   Procedure: CATARACT EXTRACTION PHACO AND INTRAOCULAR LENS PLACEMENT (IOC) RIGHT DIABETES;  Surgeon: Galen Manila, MD;  Location: [facility];  Service: Ophthalmology;  Laterality: Right;  Diabetes-oral med Latex sensitiviy   COLON RESECTION SIGMOID N/A 11/02/2016   Procedure: COLON RESECTION SIGMOID;  Surgeon: Ricarda Frame, MD;  Location: [name];  Service: General;  Laterality: N/A;   COLON SURGERY  11/02/2016   COLONOSCOPY WITH PROPOFOL N/A 02/03/2017   Procedure: COLONOSCOPY WITH PROPOFOL;  Surgeon: Toney Reil, MD;  Location: [facility];  Service: Gastroenterology;  Laterality: N/A;   COLOSTOMY N/A 11/02/2016   Procedure: COLOSTOMY;  Surgeon: Ricarda Frame, MD;  Location: [name];  Service: General;  Laterality: N/A;   COLOSTOMY REVERSAL N/A 02/24/2017   Procedure: COLOSTOMY REVERSAL, ostomy takedown, spleenic flexure mobilization, excision rectal stump/distal sigmoid, anastomosis with suture reinforcement;  Surgeon: Ricarda Frame, MD;  Location: [name];  Service: General;  Laterality: N/A;   ILEO LOOP DIVERSION N/A 02/24/2017   Procedure: ILEO LOOP COLOSTOMY;  Surgeon: Ricarda Frame, MD;  Location: [name];  Service:  General;  Laterality: N/A;   ILEOSTOMY CLOSURE N/A 06/01/2017   Procedure: LOOP ILEOSTOMY TAKEDOWN;  Surgeon: Ricarda Frame, MD;  Location: [name];  Service: General;  Laterality: N/A;   IVC FILTER INSERTION Right 10/2016   IVC FILTER REMOVAL N/A 08/09/2017   Procedure: IVC FILTER REMOVAL;  Surgeon: Renford Dills, MD;  Location: [name];  Service: Cardiovascular;  Laterality: N/A;   KNEE SURGERY     torn menicus   LAPAROSCOPY N/A 02/24/2017   Procedure: LAPAROSCOPY DIAGNOSTIC;  Surgeon: Ricarda Frame, MD;  Location: [name];  Service: General;  Laterality: N/A;   LYSIS OF ADHESION N/A 02/24/2017   Procedure: LYSIS OF ADHESION;  Surgeon: Ricarda Frame, MD;  Location: [name];  Service: General;  Laterality: N/A;   PULMONARY VENOGRAPHY N/A 11/19/2016   Procedure: Pulmonary Venography; IVC filter placement; possible pulmonary thrombectomy;  Surgeon: Renford Dills, MD;  Location: [name];  Service: Cardiovascular;  Laterality: N/A;   SACROPLASTY N/A 11/08/2017   Procedure: SACROPLASTY S2;  Surgeon: Kennedy Bucker, MD;  Location: [name];  Service: Orthopedics;  Laterality: N/A;   SIGMOIDOSCOPY N/A 11/13/2016   Procedure: endoscopic  flexible SIGMOIDOSCOPY;  Surgeon:  Lattie Haw, MD;  Location: [name];  Service: General;  Laterality: N/A;   SIGMOIDOSCOPY N/A 05/19/2017   Procedure: Arnell Sieving;  Surgeon: Ricarda Frame, MD;  Location: [name];  Service: General;  Laterality: N/A;   VIDEO BRONCHOSCOPY WITH ENDOBRONCHIAL NAVIGATION N/A 02/23/2021   Procedure: ROBOTIC VIDEO BRONCHOSCOPY WITH ENDOBRONCHIAL NAVIGATION;  Surgeon: [name], MD;  Location: [name];  Service: Pulmonary;  Laterality: N/A;    reports that she has never smoked. She has never used smokeless tobacco. She reports that she does not drink alcohol and does not use drugs.  Allergies  Allergen Reactions   Adhesive [Tape] Other (See Comments)    Pt reports,  when removing tape from skin most tapes pull her skin off. Ok to use paper tape   Other Hives    Chlorhexadine wipes/CHG wipes,    Latex Rash    Exam gloves/ does not react around elastic and lips don't swell when blowing balloons   Morphine And Codeine Nausea And Vomiting    Family History  Problem Relation Age of Onset   Diabetes Mother    Heart disease Mother    Cancer Mother    Breast cancer Mother        >50   Heart disease Father    Diabetes Sister    Heart disease Sister     Prior to Admission medications   Medication Sig Start Date End Date Taking? Authorizing Provider  calcium carbonate (CALCIUM 600) 600 MG TABS tablet Take 1 tablet (600 mg total) by mouth 2 (two) times daily with a meal. 02/25/20 01/21/23  Delano Metz, MD  candesartan (ATACAND) 16 MG tablet Take 16 mg by mouth in the morning. 08/03/17   [provider]  cholecalciferol (VITAMIN D) 25 MCG (1000 UNIT) tablet Take 1,000 Units by mouth in the morning.    [provider]  citalopram (CELEXA) 10 MG tablet Take 1 tablet by mouth daily. 11/12/22   [provider]  Cyanocobalamin (VITAMIN B-12) 5000 MCG TBDP Take 5,000 mcg by mouth in the morning.    [provider]  cyclobenzaprine (FLEXERIL) 10 MG tablet Take 10 mg by mouth at bedtime. 07/05/16   [provider]  Dulaglutide 1.5 MG/0.5ML SOPN Inject 1.5 mg into the skin every Saturday.    [provider]  gabapentin (NEURONTIN) 100 MG capsule Take 200 mg by mouth with breakfast, with lunch, and with evening meal.    [provider]  gabapentin (NEURONTIN) 300 MG capsule Take 300 mg by mouth at bedtime.    [provider]  glipiZIDE (GLUCOTROL) 10 MG tablet Take 10 mg by mouth 2 (two) times daily before a meal.    [provider]  HYDROcodone-acetaminophen (NORCO/VICODIN) 5-325 MG tablet Take 1 tablet by mouth every 6 (six) hours as needed for moderate pain.    [provider]  Magnesium Oxide -Mg Supplement 500 MG TABS Take by mouth.    [provider]  meclizine (ANTIVERT) 25 MG tablet Take 25 mg by mouth 3 (three) times daily as needed for dizziness. 12/29/17   [provider]  oxybutynin (DITROPAN) 5 MG tablet Take 1 tablet by mouth 2 (two) times daily. 02/25/22   [provider]  rOPINIRole (REQUIP) 0.5 MG tablet Take 0.5 mg by mouth at bedtime.    [provider]  warfarin (COUMADIN) 6 MG tablet Take 6 mg by mouth See admin instructions. Take 1 tablet (6 mg) by mouth on Mondays,  Tuesdays, Wednesdays, & Thursdays Take 1 tablet (6 mg) with 1 tablet (1 mg) by mouth on Fridays, Saturdays & Sundays (7 mg)    [provider]     Vitals:   03/20/23 1738 03/20/23 1915 03/20/23 2013 03/20/23 2015  BP: (!) 161/143 (!) 150/82  (!) 158/78  Pulse: 88   74  Resp: 18   16  Temp: 97.6 F (36.4 C)   (!) 97.4 F (36.3 C)  TempSrc: Oral   Oral  SpO2: 100%   99%  Weight:   90.7 kg   Height:   5\' 3"  (1.6 m)    Physical Exam Vitals and nursing note reviewed.  Constitutional:      General: She is not in acute distress. HENT:     Head: Normocephalic and atraumatic.     Right Ear: Hearing normal.     Left Ear: Hearing normal.     Nose: Nose normal. No nasal deformity.     Mouth/Throat:     Lips: Pink.     Tongue: No lesions.     Pharynx: Oropharynx is clear.  Eyes:     General: Lids are normal.     Extraocular Movements: Extraocular movements intact.  Cardiovascular:     Rate and Rhythm: Normal rate and regular rhythm.     Heart sounds: Normal heart sounds.  Pulmonary:     Effort: Pulmonary effort is normal.     Breath sounds: Normal breath sounds.  Abdominal:     General: Abdomen is protuberant. Bowel sounds are decreased. There is no distension.     Palpations: Abdomen is soft. There is no mass.     Tenderness: There is generalized abdominal tenderness.  Musculoskeletal:     Right lower leg: No edema.     Left  lower leg: No edema.  Skin:    General: Skin is warm.  Neurological:     General: No focal deficit present.     Mental Status: She is alert and oriented to person, place, and time.     Cranial Nerves: Cranial nerves 2-12 are intact.  Psychiatric:        Attention and Perception: Attention normal.        Mood and Affect: Mood normal.        Speech: Speech normal.        Behavior: Behavior normal. Behavior is cooperative.       Labs on Admission: I have personally reviewed following labs and imaging studies Results for orders placed or performed during the hospital encounter of 03/20/23 (from the past 24 hour(s))  Basic metabolic panel     Status: Abnormal   Collection Time: 03/20/23  5:41 PM  Result Value Ref Range   Sodium 135 135 - 145 mmol/L   Potassium 4.2 3.5 - 5.1 mmol/L   Chloride 103 98 - 111 mmol/L   CO2 21 (L) 22 - 32 mmol/L   Glucose, Bld 211 (H) 70 - 99 mg/dL   BUN 20 8 - 23 mg/dL   Creatinine, Ser 4.09 0.44 - 1.00 mg/dL   Calcium 9.7 8.9 - 81.1 mg/dL   GFR, Estimated >91 >47 mL/min   Anion gap 11 5 - 15  CBC     Status: Abnormal   Collection Time: 03/20/23  5:41 PM  Result Value Ref Range   WBC 16.2 (H) 4.0 - 10.5 K/uL   RBC 5.11 3.87 - 5.11 MIL/uL   Hemoglobin 14.7 12.0 - 15.0 g/dL   HCT 44.2  36.0 - 46.0 %   MCV 86.5 80.0 - 100.0 fL   MCH 28.8 26.0 - 34.0 pg   MCHC 33.3 30.0 - 36.0 g/dL   RDW 82.9 56.2 - 13.0 %   Platelets 290 150 - 400 K/uL   nRBC 0.0 0.0 - 0.2 %  Troponin I (High Sensitivity)     Status: None   Collection Time: 03/20/23  5:41 PM  Result Value Ref Range   Troponin I (High Sensitivity) 4 <18 ng/L  Troponin I (High Sensitivity)     Status: None   Collection Time: 03/20/23  7:10 PM  Result Value Ref Range   Troponin I (High Sensitivity) 4 <18 ng/L  Lipase, blood     Status: None   Collection Time: 03/20/23  7:10 PM  Result Value Ref Range   Lipase 34 11 - 51 U/L  Hepatic function panel     Status: Abnormal   Collection Time: 03/20/23   7:10 PM  Result Value Ref Range   Total Protein 7.7 6.5 - 8.1 g/dL   Albumin 4.1 3.5 - 5.0 g/dL   AST 21 15 - 41 U/L   ALT 31 0 - 44 U/L   Alkaline Phosphatase 50 38 - 126 U/L   Total Bilirubin 1.2 0.3 - 1.2 mg/dL   Bilirubin, Direct 0.2 0.0 - 0.2 mg/dL   Indirect Bilirubin 1.0 (H) 0.3 - 0.9 mg/dL  Protime-INR     Status: Abnormal   Collection Time: 03/20/23  7:10 PM  Result Value Ref Range   Prothrombin Time 15.8 (H) 11.4 - 15.2 seconds   INR 1.2 0.8 - 1.2   CBC: Recent Labs  Lab 03/20/23 1741  WBC 16.2*  HGB 14.7  HCT 44.2  MCV 86.5  PLT 290   Basic Metabolic Panel: Recent Labs  Lab 03/20/23 1741  NA 135  K 4.2  CL 103  CO2 21*  GLUCOSE 211*  BUN 20  CREATININE 0.78  CALCIUM 9.7   GFR: Estimated Creatinine Clearance: 68.9 mL/min (by C-G formula based on SCr of 0.78 mg/dL). Liver Function Tests: Recent Labs  Lab 03/20/23 1910  AST 21  ALT 31  ALKPHOS 50  BILITOT 1.2  PROT 7.7  ALBUMIN 4.1   Recent Labs  Lab 03/20/23 1910  LIPASE 34   No results for input(s): "AMMONIA" in the last 168 hours. Coagulation Profile: Recent Labs  Lab 03/20/23 1910  INR 1.2   Cardiac Enzymes: No results for input(s): "CKTOTAL", "CKMB", "CKMBINDEX", "TROPONINI" in the last 168 hours. BNP (last 3 results) No results for input(s): "PROBNP" in the last 8760 hours. HbA1C: No results for input(s): "HGBA1C" in the last 72 hours. CBG: No results for input(s): "GLUCAP" in the last 168 hours. Lipid Profile: No results for input(s): "CHOL", "HDL", "LDLCALC", "TRIG", "CHOLHDL", "LDLDIRECT" in the last 72 hours. Thyroid Function Tests: No results for input(s): "TSH", "T4TOTAL", "FREET4", "T3FREE", "THYROIDAB" in the last 72 hours. Anemia Panel: No results for input(s): "VITAMINB12", "FOLATE", "FERRITIN", "TIBC", "IRON", "RETICCTPCT" in the last 72 hours. Urinalysis: none  Unresulted Labs (From admission, onward)     Start     Ordered   03/21/23 0500  Comprehensive  metabolic panel  Tomorrow morning,   R        03/20/23 2033   03/21/23 0500  CBC  Tomorrow morning,   R        03/20/23 2033   03/20/23 2033  Urinalysis, Complete w Microscopic -Urine, Random  Add-on,   AD  Question:  Specimen Source  Answer:  Urine, Random   03/20/23 2033           Medications  irbesartan (AVAPRO) tablet 150 mg (has no administration in time range)  gabapentin (NEURONTIN) capsule 300 mg (has no administration in time range)  rOPINIRole (REQUIP) tablet 0.5 mg (has no administration in time range)  oxybutynin (DITROPAN) tablet 5 mg (has no administration in time range)  sodium chloride flush (NS) 0.9 % injection 3 mL (has no administration in time range)  acetaminophen (TYLENOL) tablet 650 mg (has no administration in time range)    Or  acetaminophen (TYLENOL) suppository 650 mg (has no administration in time range)  heparin injection 5,000 Units (has no administration in time range)  lactated ringers infusion ( Intravenous Rate/Dose Change 03/20/23 2123)  insulin aspart (novoLOG) injection 0-6 Units (has no administration in time range)  fentaNYL (SUBLIMAZE) injection 50 mcg (has no administration in time range)  fentaNYL (SUBLIMAZE) injection 75 mcg (75 mcg Intravenous Given 03/20/23 1916)  ondansetron (ZOFRAN) injection 4 mg (4 mg Intravenous Given 03/20/23 1912)  iohexol (OMNIPAQUE) 350 MG/ML injection 100 mL (100 mLs Intravenous Contrast Given 03/20/23 1925)   Radiological Exams on Admission: DG Abd Portable 1 View  Result Date: 03/20/2023 CLINICAL DATA:  Enteric catheter placement EXAM: PORTABLE ABDOMEN - 1 VIEW COMPARISON:  03/20/2023 FINDINGS: Frontal view of the lower chest and upper abdomen demonstrates enteric catheter passing below diaphragm, tip projecting over the gastric body. Stable right perihilar consolidation consistent with patient's history of known lung cancer. IMPRESSION: 1. Enteric catheter tip projecting over the gastric body.  Electronically Signed   By: Sharlet Salina M.D.   On: 03/20/2023 20:44   CT Angio Chest/Abd/Pel for Dissection W and/or Wo Contrast  Result Date: 03/20/2023 CLINICAL DATA:  Chest and abdominal pain, acute aortic syndrome suspected, history of lung cancer * Tracking Code: BO * EXAM: CT ANGIOGRAPHY CHEST, ABDOMEN AND PELVIS TECHNIQUE: Non-contrast CT of the chest was initially obtained. Multidetector CT imaging through the chest, abdomen and pelvis was performed using the standard protocol during bolus administration of intravenous contrast. Multiplanar reconstructed images and MIPs were obtained and reviewed to evaluate the vascular anatomy. RADIATION DOSE REDUCTION: This exam was performed according to the departmental dose-optimization program which includes automated exposure control, adjustment of the mA and/or kV according to patient size and/or use of iterative reconstruction technique. CONTRAST:  OMNIPAQUE IOHEXOL 350 MG/ML SOLN COMPARISON:  CT chest, 01/14/2023, CT abdomen pelvis, 11/20/2021 FINDINGS: CTA CHEST FINDINGS VASCULAR Aorta: Satisfactory opacification of the aorta. Normal contour and caliber of the thoracic aorta. No evidence of aneurysm, dissection, or other acute aortic pathology. Mild aortic atherosclerosis Cardiovascular: No evidence of pulmonary embolism on limited non-tailored examination. Normal heart size. Scattered left coronary artery calcifications. No pericardial effusion. Review of the MIP images confirms the above findings. NON VASCULAR Mediastinum/Nodes: Unchanged prominent pretracheal and right hilar lymph nodes (series 5, image 42). Thyroid gland, trachea, and esophagus demonstrate no significant findings. Lungs/Pleura: Unchanged post treatment appearance of the chest, with dense perihilar consolidation and fibrosis of the right lung (series 6, image 48). There are again noted multiple small pleural nodules, particularly about the right lung base (series 6, image 96, as  well as along the superior margin of the treated mass in the right upper lobe (series 6, image 46). No pleural effusion or pneumothorax. Musculoskeletal: No chest wall abnormality. No acute osseous findings. Review of the MIP images confirms the above findings. CTA ABDOMEN AND  PELVIS FINDINGS VASCULAR Normal contour and caliber of the abdominal aorta. No evidence of aneurysm, dissection, or other acute aortic pathology. Standard branching pattern of the abdominal aorta with solitary bilateral renal arteries. Mild aortic atherosclerosis. Review of the MIP images confirms the above findings. NON-VASCULAR Hepatobiliary: No solid liver abnormality is seen. Small gallstones. No gallbladder wall thickening, or biliary dilatation. Pancreas: Unremarkable. No pancreatic ductal dilatation or surrounding inflammatory changes. Spleen: Normal in size without significant abnormality. Adrenals/Urinary Tract: Adrenal glands are unremarkable. Kidneys are normal, without renal calculi, solid lesion, or hydronephrosis. Bladder is unremarkable. Stomach/Bowel: Stomach is within normal limits. Status post appendectomy. Mildly distended, fecalized loops of distal small bowel in the mid low abdomen measuring up to 4.1 cm in caliber, with tethered appearance and relatively abrupt caliber change, several loops of sharply decompressed terminal ileum (series 5, image 214). There is however gas and stool present throughout the colon to the rectum. Lymphatic: No enlarged abdominal or pelvic lymph nodes. Reproductive: No mass or other significant abnormality. Other: Midline ventral hernia containing a single nonobstructed loop of transverse colon (series 5, image 161). No ascites. Musculoskeletal: No acute osseous findings. IMPRESSION: 1. Normal contour and caliber of the thoracic and abdominal aorta. No evidence of aneurysm, dissection, or other acute aortic pathology. Mild aortic atherosclerosis. 2. Mildly distended, fecalized loops of distal  small bowel in the mid low abdomen, with tethered appearance and relatively abrupt caliber change, several loops of sharply decompressed terminal ileum. There is however gas and stool present throughout the colon to the rectum. Findings are consistent with partial or developing small bowel obstruction, possibly due to adhesions. Overall appearance and configuration is similar to prior examinations. 3. Unchanged post treatment appearance of the chest, with dense perihilar consolidation and fibrosis of the right lung. There are again noted multiple small pleural nodules, particularly about the right lung base, as well as along the superior margin of the treated mass in the right upper lobe, as noted on prior restaging examination dated 01/14/2023. These are worrisome for recurrent/metastatic malignancy. 4. Unchanged prominent pretracheal and right hilar lymph nodes. 5. Midline ventral hernia containing a single nonobstructed loop of transverse colon. 6. Cholelithiasis. 7. Coronary artery disease. Aortic Atherosclerosis (ICD10-I70.0). Electronically Signed   By: Jearld Lesch M.D.   On: 03/20/2023 19:52   DG Chest 2 View  Result Date: 03/20/2023 CLINICAL DATA:  Chest pain. History of treated lung cancer status post radiation. EXAM: CHEST - 2 VIEW COMPARISON:  February 23, 2021, January 14, 2023 FINDINGS: The cardiomediastinal silhouette is unchanged in contour.Revisualization of a band like opacity in the RIGHT mid lung most consistent with post radiation change, grossly similar in comparison to prior CT. Atherosclerotic calcifications. No pleural effusion. No pneumothorax. No acute pleuroparenchymal abnormality. Visualized abdomen is unremarkable. Multilevel degenerative changes of the thoracic spine. IMPRESSION: 1. No acute cardiopulmonary abnormality. 2. Post radiation changes in the RIGHT mid lung. Electronically Signed   By: Meda Klinefelter M.D.   On: 03/20/2023 18:29     Data Reviewed: Relevant notes  from primary care and specialist visits, past discharge summaries as available in EHR, including Care Everywhere. Prior diagnostic testing as pertinent to current admission diagnoses Updated medications and problem lists for reconciliation ED course, including vitals, labs, imaging, treatment and response to treatment Triage notes, nursing and pharmacy notes and ED provider's notes Notable results as noted in HPI  Assessment and Plan: * Abdominal pain 2/2 SBO. PRN fentanyl.   SBO (small bowel obstruction) (HCC) NGT in  place.  MIVF. General surgery consult. Monitor and correct electrolytes. IV PPI.    Chest pain in adult TNI is negative.  EKG negative for st elevation.  We will follow. Suspect this is GI related.     Chronic diastolic (congestive) heart failure (HCC) Strict I/O. Weights. Pt is euvolemic no edema or jvd/ lung are clear on auscultation.   Other pulmonary embolism without acute cor pulmonale (HCC) As pt is npo we will start heparin for dvt prophylaxis.     Essential hypertension Vitals:   03/20/23 1738 03/20/23 1915 03/20/23 2015  BP: (!) 161/143 (!) 150/82 (!) 158/78  PO BP meds continued.    Type 2 diabetes mellitus with obesity (HCC) NPO. Glycemic protocol q4 hours.  Sliding scale insulin q4hours.  Hold glipizide.      DVT prophylaxis:  Heparin. Consults:  General surgery: Dr.Pabon.  Advance Care Planning:    Code Status: Full Code   Family Communication:  None. Disposition Plan:  Home. Severity of Illness: The appropriate patient status for this patient is OBSERVATION. Observation status is judged to be reasonable and necessary in order to provide the required intensity of service to ensure the patient's safety. The patient's presenting symptoms, physical exam findings, and initial radiographic and laboratory data in the context of their medical condition is felt to place them at decreased risk for further clinical deterioration.  Furthermore, it is anticipated that the patient will be medically stable for discharge from the hospital within 2 midnights of admission.   Author: Gertha Calkin, MD 03/20/2023 9:37 PM  For on call review [email or web address].

## 2023-03-20 NOTE — Assessment & Plan Note (Signed)
NGT in place.  MIVF. General surgery consult. Monitor and correct electrolytes. IV PPI.

## 2023-03-20 NOTE — ED Provider Notes (Signed)
[facility] Provider Note    Event Date/Time   First MD Initiated Contact with Patient 03/20/23 1844     (approximate)   History   Abdominal Pain and Chest Pain   HPI  Kara [name] is a 72 y.o. female with history of blood clot on anticoagulation who comes in with concerns for abdominal pain.  Patient reports severe abdominal pain.  She reports is currently in her right lower abdomen but was initially more epigastric and radiating up into her chest.  She did reports that it is severe and denies ever having anything like this previously.  She denies any falls and hitting her head.   Physical Exam   Triage Vital Signs: ED Triage Vitals  Encounter Vitals Group     BP 03/20/23 1738 (!) 161/143     Systolic BP Percentile --      Diastolic BP Percentile --      Pulse Rate 03/20/23 1738 88     Resp 03/20/23 1738 18     Temp 03/20/23 1738 97.6 F (36.4 C)     Temp Source 03/20/23 1738 Oral     SpO2 03/20/23 1738 100 %     Weight --      Height --      Head Circumference --      Peak Flow --      Pain Score 03/20/23 1726 10     Pain Loc --      Pain Education --      Exclude from Growth Chart --     Most recent vital signs: Vitals:   03/20/23 1738  BP: (!) 161/143  Pulse: 88  Resp: 18  Temp: 97.6 F (36.4 C)  SpO2: 100%     General: Awake, no distress.  CV:  Good peripheral perfusion.  Resp:  Normal effort.  Abd:  No distention.  Tender in her abdomen. Other:     ED Results / Procedures / Treatments   Labs (all labs ordered are listed, but only abnormal results are displayed) Labs Reviewed  BASIC METABOLIC PANEL - Abnormal; Notable for the following components:      Result Value   CO2 21 (*)    Glucose, Bld 211 (*)    All other components within normal limits  CBC - Abnormal; Notable for the following components:   WBC 16.2 (*)    All other components within normal limits  LIPASE, BLOOD  HEPATIC FUNCTION PANEL   PROTIME-INR  TROPONIN I (HIGH SENSITIVITY)  TROPONIN I (HIGH SENSITIVITY)     EKG  My interpretation of EKG:  Normal sinus rate of 78 without any ST elevation or T wave inversions, normal intervals  RADIOLOGY  CT imaging my interpretation shows possible early bowel obstruction   PROCEDURES:  Critical Care performed: No  .1-3 Lead EKG Interpretation  Performed by: Concha Se, MD Authorized by: Concha Se, MD     Interpretation: normal     ECG rate:  70   ECG rate assessment: normal     Rhythm: sinus rhythm     Ectopy: none     Conduction: normal      MEDICATIONS ORDERED IN ED: Medications  iohexol (OMNIPAQUE) 350 MG/ML injection 100 mL (has no administration in time range)  fentaNYL (SUBLIMAZE) injection 75 mcg (75 mcg Intravenous Given 03/20/23 1916)  ondansetron (ZOFRAN) injection 4 mg (4 mg Intravenous Given 03/20/23 1912)     IMPRESSION / MDM / ASSESSMENT AND  PLAN / ED COURSE  I reviewed the triage vital signs and the nursing notes.   Patient's presentation is most consistent with acute presentation with potential threat to life or bodily function.   Patient comes in with severe epigastric abdominal pain CT dissection ordered to evaluate for dissection versus obstruction for diverticulitis.  EKG, cardiac markers ordered to evaluate for ACS  INR is 1.2 troponin negative, hepatic function, BMP reassuring  CT and imaging concerning for early bowel obstruction.  Reviewed from when she had this previously and it was treated with NG tube.  Will place with NG tube I discussed the hospital team for admission  The patient is on the cardiac monitor to evaluate for evidence of arrhythmia and/or significant heart rate changes.      FINAL CLINICAL IMPRESSION(S) / ED DIAGNOSES   Final diagnoses:  Small bowel obstruction due to adhesions [facility])     Rx / DC Orders   ED Discharge Orders     None        Note:  This document was prepared using Dragon  voice recognition software and may include unintentional dictation errors.   Concha Se, MD 03/20/23 2120

## 2023-03-20 NOTE — Assessment & Plan Note (Signed)
2/2 SBO. PRN fentanyl.

## 2023-03-20 NOTE — Assessment & Plan Note (Addendum)
As pt is npo we will start heparin for dvt prophylaxis.

## 2023-03-20 NOTE — ED Triage Notes (Signed)
Pt comes via EMs from home with c/o cp and belly pain. Pt states the belly pain started at 9am and then later turned into cp. Pt states dizziness, nausea and vomiting. Pt states hx of diverticulitis.  Pt states tender to touch all over.  150/80 HR-80 99% RA CBG-192 T-97.7

## 2023-03-20 NOTE — Assessment & Plan Note (Addendum)
NPO. Glycemic protocol q4 hours.  Sliding scale insulin q4hours.  Hold glipizide.

## 2023-03-21 ENCOUNTER — Observation Stay: Payer: PPO

## 2023-03-21 DIAGNOSIS — Z7984 Long term (current) use of oral hypoglycemic drugs: Secondary | ICD-10-CM | POA: Diagnosis not present

## 2023-03-21 DIAGNOSIS — Z923 Personal history of irradiation: Secondary | ICD-10-CM | POA: Diagnosis not present

## 2023-03-21 DIAGNOSIS — K8020 Calculus of gallbladder without cholecystitis without obstruction: Secondary | ICD-10-CM | POA: Diagnosis not present

## 2023-03-21 DIAGNOSIS — J8410 Pulmonary fibrosis, unspecified: Secondary | ICD-10-CM | POA: Diagnosis not present

## 2023-03-21 DIAGNOSIS — Z7901 Long term (current) use of anticoagulants: Secondary | ICD-10-CM | POA: Diagnosis not present

## 2023-03-21 DIAGNOSIS — Z833 Family history of diabetes mellitus: Secondary | ICD-10-CM | POA: Diagnosis not present

## 2023-03-21 DIAGNOSIS — D72829 Elevated white blood cell count, unspecified: Secondary | ICD-10-CM | POA: Diagnosis not present

## 2023-03-21 DIAGNOSIS — Z885 Allergy status to narcotic agent status: Secondary | ICD-10-CM | POA: Diagnosis not present

## 2023-03-21 DIAGNOSIS — I110 Hypertensive heart disease with heart failure: Secondary | ICD-10-CM | POA: Diagnosis not present

## 2023-03-21 DIAGNOSIS — Z803 Family history of malignant neoplasm of breast: Secondary | ICD-10-CM | POA: Diagnosis not present

## 2023-03-21 DIAGNOSIS — R1084 Generalized abdominal pain: Secondary | ICD-10-CM | POA: Diagnosis not present

## 2023-03-21 DIAGNOSIS — R918 Other nonspecific abnormal finding of lung field: Secondary | ICD-10-CM | POA: Diagnosis not present

## 2023-03-21 DIAGNOSIS — Z91048 Other nonmedicinal substance allergy status: Secondary | ICD-10-CM | POA: Diagnosis not present

## 2023-03-21 DIAGNOSIS — E669 Obesity, unspecified: Secondary | ICD-10-CM | POA: Diagnosis not present

## 2023-03-21 DIAGNOSIS — K567 Ileus, unspecified: Secondary | ICD-10-CM

## 2023-03-21 DIAGNOSIS — Z6836 Body mass index (BMI) 36.0-36.9, adult: Secondary | ICD-10-CM | POA: Diagnosis not present

## 2023-03-21 DIAGNOSIS — Z8249 Family history of ischemic heart disease and other diseases of the circulatory system: Secondary | ICD-10-CM | POA: Diagnosis not present

## 2023-03-21 DIAGNOSIS — K439 Ventral hernia without obstruction or gangrene: Secondary | ICD-10-CM | POA: Diagnosis not present

## 2023-03-21 DIAGNOSIS — K565 Intestinal adhesions [bands], unspecified as to partial versus complete obstruction: Secondary | ICD-10-CM | POA: Diagnosis not present

## 2023-03-21 DIAGNOSIS — Z85118 Personal history of other malignant neoplasm of bronchus and lung: Secondary | ICD-10-CM | POA: Diagnosis not present

## 2023-03-21 DIAGNOSIS — Z86711 Personal history of pulmonary embolism: Secondary | ICD-10-CM | POA: Diagnosis not present

## 2023-03-21 DIAGNOSIS — K56609 Unspecified intestinal obstruction, unspecified as to partial versus complete obstruction: Secondary | ICD-10-CM

## 2023-03-21 DIAGNOSIS — Z91040 Latex allergy status: Secondary | ICD-10-CM | POA: Diagnosis not present

## 2023-03-21 DIAGNOSIS — Q600 Renal agenesis, unilateral: Secondary | ICD-10-CM | POA: Diagnosis not present

## 2023-03-21 DIAGNOSIS — R079 Chest pain, unspecified: Secondary | ICD-10-CM | POA: Diagnosis not present

## 2023-03-21 DIAGNOSIS — E119 Type 2 diabetes mellitus without complications: Secondary | ICD-10-CM | POA: Diagnosis not present

## 2023-03-21 DIAGNOSIS — I5032 Chronic diastolic (congestive) heart failure: Secondary | ICD-10-CM | POA: Diagnosis not present

## 2023-03-21 DIAGNOSIS — E66812 Obesity, class 2: Secondary | ICD-10-CM | POA: Diagnosis not present

## 2023-03-21 DIAGNOSIS — M810 Age-related osteoporosis without current pathological fracture: Secondary | ICD-10-CM | POA: Diagnosis not present

## 2023-03-21 LAB — MRSA NEXT GEN BY PCR, NASAL: MRSA by PCR Next Gen: NOT DETECTED

## 2023-03-21 LAB — CBC
HCT: 39.5 % (ref 36.0–46.0)
Hemoglobin: 13.1 g/dL (ref 12.0–15.0)
MCH: 28.8 pg (ref 26.0–34.0)
MCHC: 33.2 g/dL (ref 30.0–36.0)
MCV: 86.8 fL (ref 80.0–100.0)
Platelets: 263 10*3/uL (ref 150–400)
RBC: 4.55 MIL/uL (ref 3.87–5.11)
RDW: 14.5 % (ref 11.5–15.5)
WBC: 9.9 10*3/uL (ref 4.0–10.5)
nRBC: 0 % (ref 0.0–0.2)

## 2023-03-21 LAB — COMPREHENSIVE METABOLIC PANEL
ALT: 26 U/L (ref 0–44)
AST: 18 U/L (ref 15–41)
Albumin: 3.7 g/dL (ref 3.5–5.0)
Alkaline Phosphatase: 45 U/L (ref 38–126)
Anion gap: 8 (ref 5–15)
BUN: 17 mg/dL (ref 8–23)
CO2: 23 mmol/L (ref 22–32)
Calcium: 8.7 mg/dL — ABNORMAL LOW (ref 8.9–10.3)
Chloride: 106 mmol/L (ref 98–111)
Creatinine, Ser: 0.7 mg/dL (ref 0.44–1.00)
GFR, Estimated: >60 mL/min (ref >60)
Glucose, Bld: 192 mg/dL — ABNORMAL HIGH (ref 70–99)
Potassium: 3.8 mmol/L (ref 3.5–5.1)
Sodium: 137 mmol/L (ref 135–145)
Total Bilirubin: 0.8 mg/dL (ref 0.3–1.2)
Total Protein: 7.1 g/dL (ref 6.5–8.1)

## 2023-03-21 LAB — MAGNESIUM: Magnesium: 2.2 mg/dL (ref 1.7–2.4)

## 2023-03-21 LAB — GLUCOSE, CAPILLARY
Glucose-Capillary: 193 mg/dL — ABNORMAL HIGH (ref 70–99)
Glucose-Capillary: 175 mg/dL — ABNORMAL HIGH (ref 70–99)
Glucose-Capillary: 144 mg/dL — ABNORMAL HIGH (ref 70–99)
Glucose-Capillary: 165 mg/dL — ABNORMAL HIGH (ref 70–99)
Glucose-Capillary: 212 mg/dL — ABNORMAL HIGH (ref 70–99)

## 2023-03-21 LAB — APTT: aPTT: 40 s — ABNORMAL HIGH (ref 24–36)

## 2023-03-21 LAB — PROTIME-INR
INR: 1.3 — ABNORMAL HIGH (ref 0.8–1.2)
Prothrombin Time: 16.7 s — ABNORMAL HIGH (ref 11.4–15.2)

## 2023-03-21 LAB — PHOSPHORUS: Phosphorus: 4.2 mg/dL (ref 2.5–4.6)

## 2023-03-21 LAB — CBG MONITORING, ED: Glucose-Capillary: 205 mg/dL — ABNORMAL HIGH (ref 70–99)

## 2023-03-21 MED ORDER — OXYBUTYNIN CHLORIDE 5 MG PO TABS
5.0000 mg | ORAL_TABLET | Freq: Two times a day (BID) | ORAL | Status: DC
  Administered 2023-03-21 – 2023-03-22 (×2): 5 mg via ORAL
  Filled 2023-03-21: qty 1

## 2023-03-21 MED ORDER — HYDROCODONE-ACETAMINOPHEN 5-325 MG PO TABS
1.0000 | ORAL_TABLET | ORAL | Status: DC | PRN
  Administered 2023-03-21: 1 via ORAL
  Filled 2023-03-21: qty 1

## 2023-03-21 MED ORDER — IRBESARTAN 150 MG PO TABS: 150.0000 mg | ORAL_TABLET | Freq: Every day | ORAL | Status: DC

## 2023-03-21 MED ORDER — OXYBUTYNIN CHLORIDE 5 MG PO TABS
5.0000 mg | ORAL_TABLET | Freq: Two times a day (BID) | ORAL | Status: DC
  Filled 2023-03-21: qty 1

## 2023-03-21 MED ORDER — INSULIN ASPART 100 UNIT/ML IJ SOLN
0.0000 [IU] | INTRAMUSCULAR | Status: DC
  Administered 2023-03-21: 1 [IU] via SUBCUTANEOUS
  Administered 2023-03-21: 2 [IU] via SUBCUTANEOUS
  Administered 2023-03-22 (×2): 1 [IU] via SUBCUTANEOUS
  Administered 2023-03-22: 3 [IU] via SUBCUTANEOUS
  Filled 2023-03-21 (×5): qty 1

## 2023-03-21 MED ORDER — IRBESARTAN 150 MG PO TABS
150.0000 mg | ORAL_TABLET | Freq: Every day | ORAL | Status: DC
  Administered 2023-03-22: 150 mg via ORAL
  Filled 2023-03-21: qty 1

## 2023-03-21 MED ORDER — GABAPENTIN 250 MG/5ML PO SOLN
300.0000 mg | Freq: Every day | ORAL | Status: DC
  Filled 2023-03-21 (×2): qty 6

## 2023-03-21 MED ORDER — HYDRALAZINE HCL 20 MG/ML IJ SOLN: 10.0000 mg | Freq: Four times a day (QID) | INTRAMUSCULAR | Status: DC | PRN

## 2023-03-21 MED ORDER — GABAPENTIN 250 MG/5ML PO SOLN
300.0000 mg | Freq: Every day | ORAL | Status: DC
  Administered 2023-03-21: 300 mg via ORAL
  Filled 2023-03-21: qty 8

## 2023-03-21 MED ORDER — ROPINIROLE HCL 1 MG PO TABS
0.5000 mg | ORAL_TABLET | Freq: Every day | ORAL | Status: DC
  Administered 2023-03-21: 0.5 mg via ORAL

## 2023-03-21 MED ORDER — ROPINIROLE HCL 1 MG PO TABS
0.5000 mg | ORAL_TABLET | Freq: Every day | ORAL | Status: DC
  Filled 2023-03-21: qty 1

## 2023-03-21 MED ORDER — HEPARIN (PORCINE) 25000 UT/250ML-% IV SOLN
1100.0000 [IU]/h | INTRAVENOUS | Status: DC
  Administered 2023-03-21: 1200 [IU]/h via INTRAVENOUS
  Filled 2023-03-21: qty 250

## 2023-03-21 NOTE — TOC CM/SW Note (Signed)
Transition of Care [facility]) - Inpatient Brief Assessment   Patient Details  Name: Kara [name] MRN: [id number] Date of Birth: 1950/10/30  Transition of [facility]) CM/SW Contact:    Chapman Fitch, RN Phone Number: 03/21/2023, 10:31 AM   Clinical Narrative:    Transition of Care [facility]) Screening Note   Patient Details  Name: Kara [name] Date of Birth: 17-Sep-1950   Transition of Care [facility]) CM/SW Contact:    Chapman Fitch, RN Phone Number: 03/21/2023, 10:31 AM    Transition of Care Department [facility]) has reviewed patient and no TOC needs have been identified at this time.  If new patient transition needs arise, please place a TOC consult.   Transition of Care Asessment: Insurance and Status: Insurance coverage has been reviewed Patient has primary care physician: Yes     Prior/Current Home Services: No current home services Social Determinants of Health Reivew: SDOH reviewed no interventions necessary Readmission risk has been reviewed: Yes Transition of care needs: no transition of care needs at this time

## 2023-03-21 NOTE — Progress Notes (Signed)
PHARMACY - ANTICOAGULATION CONSULT NOTE  Pharmacy Consult for Heparin Infusion Indication: pulmonary embolus  Allergies  Allergen Reactions   Adhesive [Tape] Other (See Comments)    Pt reports, when removing tape from skin most tapes pull her skin off. Ok to use paper tape   Other Hives    Chlorhexadine wipes/CHG wipes,    Latex Rash    Exam gloves/ does not react around elastic and lips don't swell when blowing balloons   Morphine And Codeine Nausea And Vomiting    Patient Measurements: Height: 5\' 3"  (160 cm) Weight: 93.6 kg (206 lb 5.6 oz) IBW/kg (Calculated) : 52.4 Heparin Dosing Weight: 73.7 kg   Vital Signs: Temp: 98.4 F (36.9 C) (10/28 0806) Temp Source: Oral (10/28 0806) BP: 141/64 (10/28 0806) Pulse Rate: 81 (10/28 0806)  Labs: Recent Labs    03/20/23 1741 03/20/23 1910 03/21/23 0520  HGB 14.7  --  13.1  HCT 44.2  --  39.5  PLT 290  --  263  LABPROT  --  15.8*  --   INR  --  1.2  --   CREATININE 0.78  --  0.70  TROPONINIHS 4 4  --     Estimated Creatinine Clearance: 70.2 mL/min (by C-G formula based on SCr of 0.7 mg/dL).   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, 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   CHF (congestive heart failure) (HCC)    followed colon resection, "wouldn't let me get up"   Diabetes (HCC)    type 2   Diverticulitis 2018   History of being hospitalized    1 month ago for 3 days, vomiting, and kink in upper intestine   History of hiatal hernia    Hypertension    Osteoporosis    PE (pulmonary thromboembolism) (HCC)    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HCC)    Vertigo     Assessment: Patient is a 72 year old female with a past medical history of umbilical hernia, diabetes mellitus type 2, hypertension, history of PE, arthritis, and congestive heart failure presenting with abdominal pain 2/2 SBO. Prior to admission, patient was on warfarin 6 mg daily for a  PE. Pharmacy has been consulted to initiate patient on a heparin infusion until patient has been cleared by surgery.  Patient was originally initiated on therapeutic enoxaparin with the last dose 10/28 @ 0830.  No signs/symptoms of bleeding noted. Hgb 13.1. PLT 263.  Baseline aPTT and INR ordered.  Goal of Therapy:  Heparin level 0.3-0.7 units/ml Monitor platelets by anticoagulation protocol: Yes   Plan:  Heparin infusion will start 4 hours before the next dose of enoxaparin was due No bolus needed given patient was on therapeutic enoxaparin Will start heparin infusion at a rate of 1200 units/hr Monitor heparin level in 8 hours Follow CBC daily   Merryl Hacker, PharmD Clinical Pharmacist 03/21/2023,12:19 PM

## 2023-03-21 NOTE — Inpatient Diabetes Management (Signed)
Inpatient Diabetes Program Recommendations  AACE/ADA: New Consensus Statement on Inpatient Glycemic Control   Target Ranges:  Prepandial:   less than 140 mg/dL      Peak postprandial:   less than 180 mg/dL (1-2 hours)      Critically ill patients:  140 - 180 mg/dL    Latest Reference Range & Units 03/21/23 00:02 03/21/23 03:38 03/21/23 08:05  Glucose-Capillary 70 - 99 mg/dL 161 (H) 096 (H) 045 (H)  (H): Data is abnormally high  Review of Glycemic Control  Diabetes history: DM2 Outpatient Diabetes medications: Glipizide 10 mg daily Current orders for Inpatient glycemic control: Novolog 0-6 units Q4H PRN  Inpatient Diabetes Program Recommendations:    Insulin: Please change Novolog 0-6 units to Q4H (not PRN order).  Thanks, Orlando Penner, RN, MSN, CDCES Diabetes Coordinator Inpatient Diabetes Program [phone] (Team Pager from 8am to 5pm)

## 2023-03-21 NOTE — ED Notes (Signed)
Patient provided lip moisturizer and tissues.

## 2023-03-21 NOTE — Progress Notes (Signed)
[facility] Progress Note  Patient: Kara [name]    VOZ:[id number]  DOA: 03/20/2023     Date of Service: the patient was seen and examined on 03/21/2023  Chief Complaint  Patient presents with   Abdominal Pain   Chest Pain   Brief hospital course: Don Ruschak is a 72 y.o. female with PMH of HTN, umbilical hernia, diabetes mellitus type 2, hypertension, history of PE, arthritis, chronic HFpEF, as reviewed from EMR, presented to [facility] ED with complaining of abdominal pain and chest pain.  Patient had similar pain in July of this year. As per patient she had abdominal pain with nausea and pain radiated to right side of the chest.  No vomiting or diarrhea.  No any fever or chills, no shortness of breath.  ED W/up: VS afebrile, HR 88 RR 18 BP 165/82 100% on room air BMP glucose 211 slightly elevated, 221, rest within normal range LFTs within normal range, lipase negative, troponin negative CBC WBC 16.2, rest within normal range INR 1.2 UA small LE, nitrate negative, bacteria many, WBC 11-20 CTA chest /a/p: negative for PE, no aortic dissection.  Distended bowel loops with gas and stool throughout colon, consistent with small bowel obstruction possible recommendations.  Right lung fibrosis and perihilar consolidation, unchanged.  History of lung cancer in the past.  Midline ventral hernia containing nonobstructed loop of transverse colon.   Assessment and Plan:  # Small bowel obstruction Patient had 1 episode last year, this is her second episode Status post NG tube Pain resolved Repeat x-ray shows improvement and resolution of SBO Discussed with general surgery, trial of NG clamp for 4-hour, if residual is less than 200 cc then we will remove the NG tube Continue IV fluid for hydration Keep n.p.o. for now Follow general surgery for further recommendation Continue as needed medication for pain control Continue pantoprazole 40 mg IV twice daily for GI  prophylaxis  # h/o pulmonary embolism Patient was on Coumadin which has been held Started heparin IV infusion, monitor PTT as per pharmacy Monitor INR daily Resume Coumadin when cleared by general surgery   # HTN, Chronic HFpEF, no symptoms of exacerbation Troponin negative Continue gentle hydration Continue irbesartan 150 mg daily Use IV hydralazine as needed Monitor BP and titrate medications accordingly  # NIDDM T2 Held home medications for now Continue NovoLog sliding scale and monitor CBG   # History of lung cancer s/p radiation therapy, patient is following oncology as an outpatient No active issues   Body mass index is 36.55 kg/m.  Interventions:  Diet: NPO DVT Prophylaxis: Therapeutic Anticoagulation with heparin IV infusion    Advance goals of care discussion: Full code  Family Communication: family was not present at bedside, at the time of interview.  The pt provided permission to discuss medical plan with the family. Opportunity was given to ask question and all questions were answered satisfactorily.   Disposition:  Pt is from Home, admitted with small bowel obstruction, still n.p.o., has NG tube with LIS, which precludes a safe discharge. Discharge to home, when stable and cleared by general surgery, may discharge in 1 to 2 days.  Subjective: No significant events overnight, currently patient is pain-free, denies any chest pain or abdominal pain.  NG tube intact with intermittent suction, tolerating well.  Patient did pass gas in the morning, no BM yet.  Patient had 2 bowel movements before coming to the hospital yesterday.   Physical Exam: General: NAD, lying comfortably Appear in no  distress, affect appropriate Eyes: PERRLA ENT: Oral Mucosa Clear, moist  Neck: no JVD,  Cardiovascular: S1 and S2 Present, no Murmur,  Respiratory: good respiratory effort, Bilateral Air entry equal and Decreased, no Crackles, no wheezes Abdomen: BS sluggish, obese,  soft, ventral hernia, no tenderness, NG tube intact Skin: no rashes Extremities: no Pedal edema, no calf tenderness Neurologic: without any new focal findings Gait not checked due to patient safety concerns  Vitals:   03/21/23 0147 03/21/23 0200 03/21/23 0355 03/21/23 0806  BP: (!) 153/72   (!) 141/64  Pulse: 78   81  Resp: 17   18  Temp: 97.7 F (36.5 C)   98.4 F (36.9 C)  TempSrc: Oral   Oral  SpO2: 100%   93%  Weight:  92.9 kg 93.6 kg   Height:  5\' 3"  (1.6 m)      Intake/Output Summary (Last 24 hours) at 03/21/2023 1206 Last data filed at 03/21/2023 0800 Gross per 24 hour  Intake 334.75 ml  Output 425 ml  Net -90.25 ml   Filed Weights   03/20/23 2013 03/21/23 0200 03/21/23 0355  Weight: 90.7 kg 92.9 kg 93.6 kg    Data Reviewed: I have personally reviewed and interpreted daily labs, tele strips, imagings as discussed above. I reviewed all nursing notes, pharmacy notes, vitals, pertinent old records I have discussed plan of care as described above with RN and patient/family.  CBC: Recent Labs  Lab 03/20/23 1741 03/21/23 0520  WBC 16.2* 9.9  HGB 14.7 13.1  HCT 44.2 39.5  MCV 86.5 86.8  PLT 290 263   Basic Metabolic Panel: Recent Labs  Lab 03/20/23 1741 03/21/23 0520  NA 135 137  K 4.2 3.8  CL 103 106  CO2 21* 23  GLUCOSE 211* 192*  BUN 20 17  CREATININE 0.78 0.70  CALCIUM 9.7 8.7*  MG  --  2.2  PHOS  --  4.2    Studies: DG ABD ACUTE 2+V W 1V CHEST  Result Date: 03/21/2023 CLINICAL DATA:  Postoperative ileus. EXAM: DG ABDOMEN ACUTE WITH 1 VIEW CHEST COMPARISON:  March 20, 2023. FINDINGS: There is no evidence of dilated bowel loops or free intraperitoneal air. No radiopaque calculi or other significant radiographic abnormality is seen. Heart size and mediastinal contours are within normal limits. Right upper lobe airspace opacity is noted consistent with pneumonia or atelectasis. Nasogastric tube tip is seen in expected position of distal stomach.  IMPRESSION: No abnormal bowel dilatation. Right upper lobe airspace opacity is noted concerning for pneumonia or atelectasis. Electronically Signed   By: Lupita Raider M.D.   On: 03/21/2023 09:12   US ABDOMEN LIMITED RUQ (LIVER/GB)  Result Date: 03/20/2023 CLINICAL DATA:  Abdomen pain EXAM: ULTRASOUND ABDOMEN LIMITED RIGHT UPPER QUADRANT COMPARISON:  CT 03/20/2023 FINDINGS: Gallbladder: Gallstones.  Negative sonographic Murphy.  Normal wall thickness Common bile duct: Diameter: 6.8 mm Liver: Echogenic liver parenchyma. No focal hepatic abnormality. Portal vein is patent on color Doppler imaging with normal direction of blood flow towards the liver. Other: None. IMPRESSION: 1. Cholelithiasis without sonographic evidence for acute cholecystitis. 2. Upper normal common bile duct measuring 6.8 mm. Correlate with LFTs. 3. Echogenic liver parenchyma consistent with hepatic steatosis. Electronically Signed   By: Jasmine Pang M.D.   On: 03/20/2023 23:35   DG Abd Portable 1 View  Result Date: 03/20/2023 CLINICAL DATA:  Enteric catheter placement EXAM: PORTABLE ABDOMEN - 1 VIEW COMPARISON:  03/20/2023 FINDINGS: Frontal view of the lower chest and upper  abdomen demonstrates enteric catheter passing below diaphragm, tip projecting over the gastric body. Stable right perihilar consolidation consistent with patient's history of known lung cancer. IMPRESSION: 1. Enteric catheter tip projecting over the gastric body. Electronically Signed   By: Sharlet Salina M.D.   On: 03/20/2023 20:44   CT Angio Chest/Abd/Pel for Dissection W and/or Wo Contrast  Result Date: 03/20/2023 CLINICAL DATA:  Chest and abdominal pain, acute aortic syndrome suspected, history of lung cancer * Tracking Code: BO * EXAM: CT ANGIOGRAPHY CHEST, ABDOMEN AND PELVIS TECHNIQUE: Non-contrast CT of the chest was initially obtained. Multidetector CT imaging through the chest, abdomen and pelvis was performed using the standard protocol during bolus  administration of intravenous contrast. Multiplanar reconstructed images and MIPs were obtained and reviewed to evaluate the vascular anatomy. RADIATION DOSE REDUCTION: This exam was performed according to the departmental dose-optimization program which includes automated exposure control, adjustment of the mA and/or kV according to patient size and/or use of iterative reconstruction technique. CONTRAST:  OMNIPAQUE IOHEXOL 350 MG/ML SOLN COMPARISON:  CT chest, 01/14/2023, CT abdomen pelvis, 11/20/2021 FINDINGS: CTA CHEST FINDINGS VASCULAR Aorta: Satisfactory opacification of the aorta. Normal contour and caliber of the thoracic aorta. No evidence of aneurysm, dissection, or other acute aortic pathology. Mild aortic atherosclerosis Cardiovascular: No evidence of pulmonary embolism on limited non-tailored examination. Normal heart size. Scattered left coronary artery calcifications. No pericardial effusion. Review of the MIP images confirms the above findings. NON VASCULAR Mediastinum/Nodes: Unchanged prominent pretracheal and right hilar lymph nodes (series 5, image 42). Thyroid gland, trachea, and esophagus demonstrate no significant findings. Lungs/Pleura: Unchanged post treatment appearance of the chest, with dense perihilar consolidation and fibrosis of the right lung (series 6, image 48). There are again noted multiple small pleural nodules, particularly about the right lung base (series 6, image 96, as well as along the superior margin of the treated mass in the right upper lobe (series 6, image 46). No pleural effusion or pneumothorax. Musculoskeletal: No chest wall abnormality. No acute osseous findings. Review of the MIP images confirms the above findings. CTA ABDOMEN AND PELVIS FINDINGS VASCULAR Normal contour and caliber of the abdominal aorta. No evidence of aneurysm, dissection, or other acute aortic pathology. Standard branching pattern of the abdominal aorta with solitary bilateral renal  arteries. Mild aortic atherosclerosis. Review of the MIP images confirms the above findings. NON-VASCULAR Hepatobiliary: No solid liver abnormality is seen. Small gallstones. No gallbladder wall thickening, or biliary dilatation. Pancreas: Unremarkable. No pancreatic ductal dilatation or surrounding inflammatory changes. Spleen: Normal in size without significant abnormality. Adrenals/Urinary Tract: Adrenal glands are unremarkable. Kidneys are normal, without renal calculi, solid lesion, or hydronephrosis. Bladder is unremarkable. Stomach/Bowel: Stomach is within normal limits. Status post appendectomy. Mildly distended, fecalized loops of distal small bowel in the mid low abdomen measuring up to 4.1 cm in caliber, with tethered appearance and relatively abrupt caliber change, several loops of sharply decompressed terminal ileum (series 5, image 214). There is however gas and stool present throughout the colon to the rectum. Lymphatic: No enlarged abdominal or pelvic lymph nodes. Reproductive: No mass or other significant abnormality. Other: Midline ventral hernia containing a single nonobstructed loop of transverse colon (series 5, image 161). No ascites. Musculoskeletal: No acute osseous findings. IMPRESSION: 1. Normal contour and caliber of the thoracic and abdominal aorta. No evidence of aneurysm, dissection, or other acute aortic pathology. Mild aortic atherosclerosis. 2. Mildly distended, fecalized loops of distal small bowel in the mid low abdomen, with tethered appearance and  relatively abrupt caliber change, several loops of sharply decompressed terminal ileum. There is however gas and stool present throughout the colon to the rectum. Findings are consistent with partial or developing small bowel obstruction, possibly due to adhesions. Overall appearance and configuration is similar to prior examinations. 3. Unchanged post treatment appearance of the chest, with dense perihilar consolidation and fibrosis of  the right lung. There are again noted multiple small pleural nodules, particularly about the right lung base, as well as along the superior margin of the treated mass in the right upper lobe, as noted on prior restaging examination dated 01/14/2023. These are worrisome for recurrent/metastatic malignancy. 4. Unchanged prominent pretracheal and right hilar lymph nodes. 5. Midline ventral hernia containing a single nonobstructed loop of transverse colon. 6. Cholelithiasis. 7. Coronary artery disease. Aortic Atherosclerosis (ICD10-I70.0). Electronically Signed   By: Jearld Lesch M.D.   On: 03/20/2023 19:52   DG Chest 2 View  Result Date: 03/20/2023 CLINICAL DATA:  Chest pain. History of treated lung cancer status post radiation. EXAM: CHEST - 2 VIEW COMPARISON:  February 23, 2021, January 14, 2023 FINDINGS: The cardiomediastinal silhouette is unchanged in contour.Revisualization of a band like opacity in the RIGHT mid lung most consistent with post radiation change, grossly similar in comparison to prior CT. Atherosclerotic calcifications. No pleural effusion. No pneumothorax. No acute pleuroparenchymal abnormality. Visualized abdomen is unremarkable. Multilevel degenerative changes of the thoracic spine. IMPRESSION: 1. No acute cardiopulmonary abnormality. 2. Post radiation changes in the RIGHT mid lung. Electronically Signed   By: Meda Klinefelter M.D.   On: 03/20/2023 18:29    Scheduled Meds:  enoxaparin (LOVENOX) injection  1 mg/kg Subcutaneous Q12H   gabapentin  300 mg Per Tube QHS   insulin aspart  0-6 Units Subcutaneous Q4H   [START ON 03/22/2023] irbesartan  150 mg Per Tube Daily   oxybutynin  5 mg Per Tube BID   pantoprazole (PROTONIX) IV  40 mg Intravenous Q12H   rOPINIRole  0.5 mg Per Tube QHS   sodium chloride flush  3 mL Intravenous Q12H   Continuous Infusions:  lactated ringers 50 mL/hr at 03/21/23 0330   PRN Meds: acetaminophen **OR** acetaminophen, fentaNYL (SUBLIMAZE)  injection  Time spent: 55 minutes  Author: Gillis Santa. MD [facility] 03/21/2023 12:06 PM  To reach On-call, see care teams to locate the attending and reach out to them via [email or web address]. If 7PM-7AM, please contact night-coverage If you still have difficulty reaching the attending provider, please page the [facility] (Director on Call) for [facility] on amion for assistance.

## 2023-03-21 NOTE — Consult Note (Signed)
[facility] SURGICAL CONSULTATION NOTE (initial) - cpt: 16109   HISTORY OF PRESENT ILLNESS (HPI):  72 y.o. female presented to [facility] ED yesterday for evaluation of abdominal pain. Patient reports the acute onset of lower abdominal pain prior to admission. She states this was severe in nature. Radiated to her upper abdomen/lower chest. She reports trying to eat oatmeal yesterday morning but had episode of emesis. No fever, chills, cough, CP, SOB, or bowel changes. Last BM was yesterday. She does not believe she has been passing flatus. She has a history of SBO previously which resolved with medical management. Additionally with history of Lung CA s/p radiation therapy. History of PE on Warfarin. Does follow with pain management clinic and is on 5-325 Hydrocodone. She has an extensive surgical history including appendectomy as well as Hartman's with reversal and ileostomy (2018) and ileostomy reversal (2019) with Dr Tonita Cong. She also has a known ventral hernia; stable. She was due for follow up this week regarding this hernia. Work up in the ED revealed a leukocytosis to 16.2K, Hgb to 14.7, sCr - 0.78, no electrolyte derangements, INR 1.2, LFTs normal. She did have CTA Chest/Abdomen/Pelvis which was concerning changes of the small bowel concerning for possible obstruction vs ileus. She was admitted to the medicine service. NGT was placed.    Surgery is consulted by hospitalist physician Dr. Irena Cords, MD in this context for evaluation and management of possible SBO vs ileus.   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, 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   CHF (congestive heart failure) (HCC)    followed colon resection, "wouldn't let me get up"   Diabetes (HCC)    type 2   Diverticulitis 2018   History of being hospitalized    1 month ago for 3 days, vomiting, and kink in upper intestine   History  of hiatal hernia    Hypertension    Osteoporosis    PE (pulmonary thromboembolism) (HCC)    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HCC)    Vertigo      PAST SURGICAL HISTORY (PSH):  Past Surgical History:  Procedure Laterality Date   APPENDECTOMY N/A 02/24/2017   Procedure: Incidental  APPENDECTOMY;  Surgeon: Ricarda Frame, MD;  Location: [name] ORS;  Service: General;  Laterality: N/A;   CARPAL TUNNEL RELEASE Bilateral    CATARACT EXTRACTION W/PHACO Left 11/21/2018   Procedure: CATARACT EXTRACTION PHACO AND INTRAOCULAR LENS PLACEMENT (IOC) LEFT DIABETES;  Surgeon: Galen Manila, MD;  Location: [facility];  Service: Ophthalmology;  Laterality: Left;  latex sensitivity Diabetic - oral meds   CATARACT EXTRACTION W/PHACO Right 12/12/2018   Procedure: CATARACT EXTRACTION PHACO AND INTRAOCULAR LENS PLACEMENT (IOC) RIGHT DIABETES;  Surgeon: Galen Manila, MD;  Location: [facility];  Service: Ophthalmology;  Laterality: Right;  Diabetes-oral med Latex sensitiviy   COLON RESECTION SIGMOID N/A 11/02/2016   Procedure: COLON RESECTION SIGMOID;  Surgeon: Ricarda Frame, MD;  Location: [name] ORS;  Service: General;  Laterality: N/A;   COLON SURGERY  11/02/2016   COLONOSCOPY WITH PROPOFOL N/A 02/03/2017   Procedure: COLONOSCOPY WITH PROPOFOL;  Surgeon: Toney Reil, MD;  Location: [facility] ENDOSCOPY;  Service: Gastroenterology;  Laterality: N/A;   COLOSTOMY N/A 11/02/2016   Procedure: COLOSTOMY;  Surgeon: Ricarda Frame, MD;  Location: [name] ORS;  Service: General;  Laterality: N/A;   COLOSTOMY REVERSAL N/A 02/24/2017   Procedure: COLOSTOMY  REVERSAL, ostomy takedown, spleenic flexure mobilization, excision rectal stump/distal sigmoid, anastomosis with suture reinforcement;  Surgeon: Ricarda Frame, MD;  Location: [name] ORS;  Service: General;  Laterality: N/A;   ILEO LOOP DIVERSION N/A 02/24/2017   Procedure: ILEO LOOP COLOSTOMY;  Surgeon: Ricarda Frame, MD;  Location: [name]  ORS;  Service: General;  Laterality: N/A;   ILEOSTOMY CLOSURE N/A 06/01/2017   Procedure: LOOP ILEOSTOMY TAKEDOWN;  Surgeon: Ricarda Frame, MD;  Location: [name] ORS;  Service: General;  Laterality: N/A;   IVC FILTER INSERTION Right 10/2016   IVC FILTER REMOVAL N/A 08/09/2017   Procedure: IVC FILTER REMOVAL;  Surgeon: Renford Dills, MD;  Location: [name] INVASIVE CV LAB;  Service: Cardiovascular;  Laterality: N/A;   KNEE SURGERY     torn menicus   LAPAROSCOPY N/A 02/24/2017   Procedure: LAPAROSCOPY DIAGNOSTIC;  Surgeon: Ricarda Frame, MD;  Location: [name] ORS;  Service: General;  Laterality: N/A;   LYSIS OF ADHESION N/A 02/24/2017   Procedure: LYSIS OF ADHESION;  Surgeon: Ricarda Frame, MD;  Location: [name] ORS;  Service: General;  Laterality: N/A;   PULMONARY VENOGRAPHY N/A 11/19/2016   Procedure: Pulmonary Venography; IVC filter placement; possible pulmonary thrombectomy;  Surgeon: Renford Dills, MD;  Location: [name];  Service: Cardiovascular;  Laterality: N/A;   SACROPLASTY N/A 11/08/2017   Procedure: SACROPLASTY S2;  Surgeon: Kennedy Bucker, MD;  Location: [name] ORS;  Service: Orthopedics;  Laterality: N/A;   SIGMOIDOSCOPY N/A 11/13/2016   Procedure: endoscopic  flexible SIGMOIDOSCOPY;  Surgeon: Lattie Haw, MD;  Location: [name] ORS;  Service: General;  Laterality: N/A;   SIGMOIDOSCOPY N/A 05/19/2017   Procedure: Arnell Sieving;  Surgeon: Ricarda Frame, MD;  Location: [name] ORS;  Service: General;  Laterality: N/A;   VIDEO BRONCHOSCOPY WITH ENDOBRONCHIAL NAVIGATION N/A 02/23/2021   Procedure: ROBOTIC VIDEO BRONCHOSCOPY WITH ENDOBRONCHIAL NAVIGATION;  Surgeon: [name], MD;  Location: [name] ORS;  Service: Pulmonary;  Laterality: N/A;     MEDICATIONS:  Prior to Admission medications   Medication Sig Start Date End Date Taking? Authorizing Provider  calcium carbonate (CALCIUM 600) 600 MG TABS tablet Take 1 tablet (600 mg total) by mouth 2 (two)  times daily with a meal. 02/25/20 03/20/23 Yes Delano Metz, MD  candesartan (ATACAND) 16 MG tablet Take 16 mg by mouth in the morning. 08/03/17  Yes [provider]  cholecalciferol (VITAMIN D) 25 MCG (1000 UNIT) tablet Take 1,000 Units by mouth in the morning.   Yes [provider]  Cyanocobalamin (VITAMIN B-12) 5000 MCG TBDP Take 5,000 mcg by mouth in the morning.   Yes [provider]  cyclobenzaprine (FLEXERIL) 10 MG tablet Take 10 mg by mouth at bedtime. 07/05/16  Yes [provider]  Dulaglutide 1.5 MG/0.5ML SOPN Inject 1.5 mg into the skin every Saturday.   Yes [provider]  gabapentin (NEURONTIN) 100 MG capsule Take 200 mg by mouth with breakfast, with lunch, and with evening meal.   Yes [provider]  gabapentin (NEURONTIN) 300 MG capsule Take 300 mg by mouth at bedtime.   Yes [provider]  glipiZIDE (GLUCOTROL) 10 MG tablet Take 10 mg by mouth daily before breakfast.   Yes [provider]  HYDROcodone-acetaminophen (NORCO/VICODIN) 5-325 MG tablet Take 1 tablet by mouth every 6 (six) hours as needed for moderate pain.   Yes [provider]  Magnesium Oxide -Mg Supplement 500 MG TABS Take by mouth.   Yes [provider]  meclizine (ANTIVERT) 25 MG tablet  Take 25 mg by mouth 3 (three) times daily as needed for dizziness. 12/29/17  Yes [provider]  oxybutynin (DITROPAN) 5 MG tablet Take 1 tablet by mouth 2 (two) times daily. 02/25/22  Yes [provider]  rOPINIRole (REQUIP) 0.5 MG tablet Take 0.5 mg by mouth at bedtime.   Yes [provider]  warfarin (COUMADIN) 6 MG tablet Take 6 mg by mouth daily.   Yes [provider]  citalopram (CELEXA) 10 MG tablet Take 1 tablet by mouth daily. Patient not taking: Reported on 03/20/2023 11/12/22   [provider]     ALLERGIES:  Allergies  Allergen Reactions   Adhesive [Tape] Other (See Comments)    Pt  reports, when removing tape from skin most tapes pull her skin off. Ok to use paper tape   Other Hives    Chlorhexadine wipes/CHG wipes,    Latex Rash    Exam gloves/ does not react around elastic and lips don't swell when blowing balloons   Morphine And Codeine Nausea And Vomiting     SOCIAL HISTORY:  Social History   Socioeconomic History   Marital status: Married    Spouse name: Not on file   Number of children: Not on file   Years of education: Not on file   Highest education level: Not on file  Occupational History   Not on file  Tobacco Use   Smoking status: Never   Smokeless tobacco: Never  Vaping Use   Vaping status: Never Used  Substance and Sexual Activity   Alcohol use: No    Alcohol/week: 0.0 standard drinks of alcohol   Drug use: No   Sexual activity: Never  Other Topics Concern   Not on file  Social History Narrative   Not on file   Social Determinants of Health   Financial Resource Strain: Low Risk  (12/01/2022)   Received from [facility]   Overall Financial Resource Strain (CARDIA)    Difficulty of Paying Living Expenses: Not hard at all  Food Insecurity: No Food Insecurity (03/21/2023)   Hunger Vital Sign    Worried About Running Out of Food in the Last Year: Never true    Ran Out of Food in the Last Year: Never true  Transportation Needs: No Transportation Needs (03/21/2023)   PRAPARE - Administrator, Civil Service (Medical): No    Lack of Transportation (Non-Medical): No  Physical Activity: Not on file  Stress: Not on file  Social Connections: Not on file  Intimate Partner Violence: Not At Risk (03/21/2023)   Humiliation, Afraid, Rape, and Kick questionnaire    Fear of Current or Ex-Partner: No    Emotionally Abused: No    Physically Abused: No    Sexually Abused: No     FAMILY HISTORY:  Family History  Problem Relation Age of Onset   Diabetes Mother    Heart disease Mother    Cancer Mother    Breast  cancer Mother        >50   Heart disease Father    Diabetes Sister    Heart disease Sister       REVIEW OF SYSTEMS:  Review of Systems  Constitutional:  Negative for chills and fever.  Respiratory:  Negative for cough and shortness of breath.   Cardiovascular:  Negative for chest pain and palpitations.  Gastrointestinal:  Positive for abdominal pain, nausea and vomiting. Negative for blood in stool, constipation and diarrhea.  Genitourinary:  Negative for dysuria and urgency.  All other systems reviewed and are negative.   VITAL SIGNS:  Temp:  [97.4 F (36.3 C)-98.3 F (36.8 C)] 97.7 F (36.5 C) (10/28 0147) Pulse Rate:  [74-88] 78 (10/28 0147) Resp:  [14-18] 17 (10/28 0147) BP: (131-161)/(55-143) 153/72 (10/28 0147) SpO2:  [97 %-100 %] 100 % (10/28 0147) Weight:  [90.7 kg-93.6 kg] 93.6 kg (10/28 0355)     Height: 5\' 3"  (160 cm) Weight: 93.6 kg BMI (Calculated): 36.56   INTAKE/OUTPUT:  10/27 0701 - 10/28 0700 In: 334.8 [I.V.:304.8; NG/GT:30] Out: 225 [Urine:200; Emesis/NG output:25]  PHYSICAL EXAM:  Physical Exam Vitals and nursing note reviewed. Exam conducted with a chaperone present.  Constitutional:      General: She is not in acute distress.    Appearance: She is well-developed. She is obese. She is not ill-appearing.     Comments: Resting in bed; NAD  HENT:     Head: Normocephalic and atraumatic.     Comments: NGT in place; output low  Eyes:     General: No scleral icterus.    Extraocular Movements: Extraocular movements intact.  Cardiovascular:     Rate and Rhythm: Normal rate.     Heart sounds: Normal heart sounds. No murmur heard. Abdominal:     General: Abdomen is flat. A surgical scar is present. There is no distension.     Palpations: Abdomen is soft.     Tenderness: There is no abdominal tenderness. There is no guarding or rebound.     Hernia: A hernia is present. Hernia is present in the ventral area.     Comments: Abdomen is soft, non-tender,  non-distended, no rebound/guarding. Ventral hernia, soft, non-tender. Previous surgical scars appreciated.   Genitourinary:    Comments: Deferred Skin:    General: Skin is warm and dry.     Coloration: Skin is not jaundiced.  Neurological:     General: No focal deficit present.     Mental Status: She is alert and oriented to person, place, and time.  Psychiatric:        Mood and Affect: Mood normal.        Behavior: Behavior normal.      Labs:     Latest Ref Rng & Units 03/21/2023    5:20 AM 03/20/2023    5:41 PM 12/02/2022    7:22 PM  CBC  WBC 4.0 - 10.5 K/uL 9.9  16.2  10.4   Hemoglobin 12.0 - 15.0 g/dL 02.7  25.3  66.4   Hematocrit 36.0 - 46.0 % 39.5  44.2  45.0   Platelets 150 - 400 K/uL 263  290  239       Latest Ref Rng & Units 03/21/2023    5:20 AM 03/20/2023    7:10 PM 03/20/2023    5:41 PM  CMP  Glucose 70 - 99 mg/dL 403   474   BUN 8 - 23 mg/dL 17   20   Creatinine 2.59 - 1.00 mg/dL 5.63   8.75   Sodium 643 - 145 mmol/L 137   135   Potassium 3.5 - 5.1 mmol/L 3.8   4.2   Chloride 98 - 111 mmol/L 106   103   CO2 22 - 32 mmol/L 23   21   Calcium 8.9 - 10.3 mg/dL 8.7   9.7   Total Protein 6.5 - 8.1 g/dL 7.1  7.7    Total Bilirubin 0.3 - 1.2 mg/dL 0.8  1.2  Alkaline Phos 38 - 126 U/L 45  50    AST 15 - 41 U/L 18  21    ALT 0 - 44 U/L 26  31      Imaging studies:   CTA Chest/Abdomen/Pelvis (03/20/2023) personally reviewed, noted changes to small bowel with fecalization, no gastric distension, no free air, and radiologist report reviewed below: IMPRESSION: 1. Normal contour and caliber of the thoracic and abdominal aorta. No evidence of aneurysm, dissection, or other acute aortic pathology. Mild aortic atherosclerosis. 2. Mildly distended, fecalized loops of distal small bowel in the mid low abdomen, with tethered appearance and relatively abrupt caliber change, several loops of sharply decompressed terminal ileum. There is however gas and stool present  throughout the colon to the rectum. Findings are consistent with partial or developing small bowel obstruction, possibly due to adhesions. Overall appearance and configuration is similar to prior examinations. 3. Unchanged post treatment appearance of the chest, with dense perihilar consolidation and fibrosis of the right lung. There are again noted multiple small pleural nodules, particularly about the right lung base, as well as along the superior margin of the treated mass in the right upper lobe, as noted on prior restaging examination dated 01/14/2023. These are worrisome for recurrent/metastatic malignancy. 4. Unchanged prominent pretracheal and right hilar lymph nodes. 5. Midline ventral hernia containing a single nonobstructed loop of transverse colon. 6. Cholelithiasis. 7. Coronary artery disease.   RUQ [place] (03/20/2023) personally reviewed, cholelithiasis without cholecystitis, and radiologist report reviewed below: IMPRESSION: 1. Cholelithiasis without sonographic evidence for acute cholecystitis. 2. Upper normal common bile duct measuring 6.8 mm. Correlate with LFTs. 3. Echogenic liver parenchyma consistent with hepatic steatosis.   KUB (10/0/2024) personally reviewed, improving small bowel pattern, air in colon, no free air, and radiologist report pending...   Assessment/Plan: (ICD-10's: K69.609) 72 y.o. female with possible small bowel obstruction; however, favor more likely ileus secondary to chronic pain medications, complicated by extensive previous abdominal surgery, Hx of Lung CA s/p radiation, CHF, DM, PE on anticoagulation    - Appreciate medicine admission - Would continue NGT for now; output low and KUB improved - suspect we can remove this once bowel function returns.  - NPO for now; IVF support - No need for surgical intervention; She understands we will attempt conservative management. Should she fail to improve, would need to consider surgical intervention     - Monitor abdominal examination; on-going bowel function   - Pain control prn; antiemetics prn - Mobilize - Further management per primary service; we will follow    All of the above findings and recommendations were discussed with the patient, and all of patient's questions were answered to her expressed satisfaction.  Thank you for the opportunity to participate in this patient's care.   -- Lynden Oxford, PA-C [facility] 03/21/2023, 7:17 AM M-F: 7am - 4pm

## 2023-03-22 ENCOUNTER — Telehealth: Payer: Self-pay | Admitting: Oncology

## 2023-03-22 DIAGNOSIS — R1084 Generalized abdominal pain: Secondary | ICD-10-CM | POA: Diagnosis not present

## 2023-03-22 DIAGNOSIS — C3490 Malignant neoplasm of unspecified part of unspecified bronchus or lung: Secondary | ICD-10-CM

## 2023-03-22 DIAGNOSIS — K567 Ileus, unspecified: Secondary | ICD-10-CM | POA: Diagnosis not present

## 2023-03-22 LAB — BASIC METABOLIC PANEL
Anion gap: 9 (ref 5–15)
BUN: 17 mg/dL (ref 8–23)
CO2: 20 mmol/L — ABNORMAL LOW (ref 22–32)
Calcium: 8.3 mg/dL — ABNORMAL LOW (ref 8.9–10.3)
Chloride: 106 mmol/L (ref 98–111)
Creatinine, Ser: 0.67 mg/dL (ref 0.44–1.00)
GFR, Estimated: >60 mL/min (ref >60)
Glucose, Bld: 175 mg/dL — ABNORMAL HIGH (ref 70–99)
Potassium: 3.9 mmol/L (ref 3.5–5.1)
Sodium: 135 mmol/L (ref 135–145)

## 2023-03-22 LAB — CBC
HCT: 38.7 % (ref 36.0–46.0)
Hemoglobin: 12.9 g/dL (ref 12.0–15.0)
MCH: 28.9 pg (ref 26.0–34.0)
MCHC: 33.3 g/dL (ref 30.0–36.0)
MCV: 86.8 fL (ref 80.0–100.0)
Platelets: 226 10*3/uL (ref 150–400)
RBC: 4.46 MIL/uL (ref 3.87–5.11)
RDW: 14.3 % (ref 11.5–15.5)
WBC: 8.4 10*3/uL (ref 4.0–10.5)
nRBC: 0 % (ref 0.0–0.2)

## 2023-03-22 LAB — MAGNESIUM: Magnesium: 2.1 mg/dL (ref 1.7–2.4)

## 2023-03-22 LAB — GLUCOSE, CAPILLARY
Glucose-Capillary: 149 mg/dL — ABNORMAL HIGH (ref 70–99)
Glucose-Capillary: 189 mg/dL — ABNORMAL HIGH (ref 70–99)
Glucose-Capillary: 197 mg/dL — ABNORMAL HIGH (ref 70–99)
Glucose-Capillary: 282 mg/dL — ABNORMAL HIGH (ref 70–99)

## 2023-03-22 LAB — PROTIME-INR
INR: 1.3 — ABNORMAL HIGH (ref 0.8–1.2)
Prothrombin Time: 16.7 s — ABNORMAL HIGH (ref 11.4–15.2)

## 2023-03-22 LAB — HEPARIN LEVEL (UNFRACTIONATED): Heparin Unfractionated: 0.73 [IU]/mL — ABNORMAL HIGH (ref 0.30–0.70)

## 2023-03-22 LAB — PHOSPHORUS: Phosphorus: 4 mg/dL (ref 2.5–4.6)

## 2023-03-22 MED ORDER — WARFARIN - PHARMACIST DOSING INPATIENT: Freq: Every day | Status: DC

## 2023-03-22 MED ORDER — WARFARIN SODIUM 6 MG PO TABS
6.0000 mg | ORAL_TABLET | Freq: Once | ORAL | Status: DC
  Filled 2023-03-22: qty 1

## 2023-03-22 MED ORDER — ENOXAPARIN SODIUM 100 MG/ML IJ SOSY
1.0000 mg/kg | PREFILLED_SYRINGE | Freq: Two times a day (BID) | INTRAMUSCULAR | Status: AC
  Administered 2023-03-22: 92.5 mg via SUBCUTANEOUS
  Filled 2023-03-22: qty 1

## 2023-03-22 NOTE — Progress Notes (Signed)
PHARMACY - ANTICOAGULATION CONSULT NOTE  Pharmacy Consult for Warfarin Indication: pulmonary embolus  Allergies  Allergen Reactions   Adhesive [Tape] Other (See Comments)    Pt reports, when removing tape from skin most tapes pull her skin off. Ok to use paper tape   Other Hives    Chlorhexadine wipes/CHG wipes,    Latex Rash    Exam gloves/ does not react around elastic and lips don't swell when blowing balloons   Morphine And Codeine Nausea And Vomiting    Patient Measurements: Height: 5\' 3"  (160 cm) Weight: 93.6 kg (206 lb 5.6 oz) IBW/kg (Calculated) : 52.4 Heparin Dosing Weight: 73.7 kg   Vital Signs: Temp: 97.8 F (36.6 C) (10/29 0844) Temp Source: Oral (10/29 0844) BP: 135/73 (10/29 0844) Pulse Rate: 77 (10/29 0844)  Labs: Recent Labs    03/20/23 1741 03/20/23 1910 03/21/23 0520 03/21/23 1305 03/22/23 0231  HGB 14.7  --  13.1  --  12.9  HCT 44.2  --  39.5  --  38.7  PLT 290  --  263  --  226  APTT  --   --   --  40*  --   LABPROT  --  15.8*  --  16.7* 16.7*  INR  --  1.2  --  1.3* 1.3*  HEPARINUNFRC  --   --   --   --  0.73*  CREATININE 0.78  --  0.70  --  0.67  TROPONINIHS 4 4  --   --   --     Estimated Creatinine Clearance: 70.2 mL/min (by C-G formula based on SCr of 0.67 mg/dL).   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, 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   CHF (congestive heart failure) (HCC)    followed colon resection, "wouldn't let me get up"   Diabetes (HCC)    type 2   Diverticulitis 2018   History of being hospitalized    1 month ago for 3 days, vomiting, and kink in upper intestine   History of hiatal hernia    Hypertension    Osteoporosis    PE (pulmonary thromboembolism) (HCC)    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HCC)    Vertigo     Assessment: Patient is a 72 year old female with a past medical history of umbilical hernia, diabetes mellitus type 2,  hypertension, history of PE, arthritis, and congestive heart failure presenting with abdominal pain 2/2 SBO. Prior to admission, patient was on warfarin 6 mg daily for a hx PE. Pharmacy has been consulted to initiate patient on a heparin infusion until patient has been cleared by surgery.  Patient was originally initiated on therapeutic enoxaparin with the last dose 10/28 @ 0830.  No signs/symptoms of bleeding noted. Hgb 12.9. PLT 226.   Goal of Therapy:  Heparin level 0.3-0.7 units/ml Monitor platelets by anticoagulation protocol: Yes  10/29 0231 HL 0.73, supratherapeutic   Plan:  Per discussion with MD, will stop heparin gtt, give 1 dose of therapeutic enoxaparin now, and restart warfarin tonight No plans to bridge patient with therapeutic enoxaparin Monitor INR daily and CBC at least every 7 days  Paulita Fujita, PharmD Clinical Pharmacist 03/22/2023 10:21 AM

## 2023-03-22 NOTE — Plan of Care (Signed)
  Problem: Safety: Goal: Ability to remain free from injury will improve Outcome: Progressing   Problem: Pain Management: Goal: General experience of comfort will improve Outcome: Progressing

## 2023-03-22 NOTE — Discharge Summary (Signed)
[facility] Discharge Summary   Patient: Kara [name] WUJ:[id number]  PCP: Barbette Reichmann, MD  Date of admission: 03/20/2023   Date of discharge:  03/22/2023     Discharge Diagnoses:  Principal Problem:   Abdominal pain Active Problems:   SBO (small bowel obstruction) (HCC)   Type 2 diabetes mellitus with obesity (HCC)   Essential hypertension   Other pulmonary embolism without acute cor pulmonale (HCC)   Chronic diastolic (congestive) heart failure (HCC)   Chest pain in adult   Small bowel obstruction (HCC)   Admitted From: Home Disposition:  Home   Recommendations for Outpatient Follow-up:  PCP: in 1 wk Follow up LABS/TEST:     Follow-up Information     Barbette Reichmann, MD Follow up in 1 week(s).   Specialty: Internal Medicine Contact information: [address] [place] [address] [phone]         Creig Hines, MD Follow up in 1 week(s).   Specialty: Oncology Contact information: [address] [place] [address] [phone]                Diet recommendation: Cardiac and Carb modified diet  Activity: The patient is advised to gradually reintroduce usual activities, as tolerated  Discharge Condition: stable  Code Status: Full code   History of present illness: As per the H and P dictated on admission Hospital Course:  Kara [name] is a 72 y.o. female with PMH of HTN, umbilical hernia, diabetes mellitus type 2, hypertension, history of PE, arthritis, chronic HFpEF, as reviewed from EMR, presented to [facility] ED with complaining of abdominal pain and chest pain.  Patient had similar pain in July of this year. As per patient she had abdominal pain with nausea and pain radiated to right side of the chest.  No vomiting or diarrhea.  No any fever or chills, no shortness of breath.   ED W/up: VS afebrile, HR 88 RR 18 BP 165/82 100% on room air BMP glucose 211 slightly elevated, 221,  rest within normal range LFTs within normal range, lipase negative, troponin negative CBC WBC 16.2, rest within normal range INR 1.2 UA small LE, nitrate negative, bacteria many, WBC 11-20 CTA chest /a/p: negative for PE, no aortic dissection.  Distended bowel loops with gas and stool throughout colon, consistent with small bowel obstruction possible recommendations.  Right lung fibrosis and perihilar consolidation, unchanged.  History of lung cancer in the past.  Midline ventral hernia containing nonobstructed loop of transverse colon.   Assessment and Plan:   # Small bowel obstruction Patient had 1 episode last year, this is her second episode S/p NG tube, Pain resolved Repeat x-ray shows improvement and resolution of SBO Discussed with general surgery, trial of NG clamp for 4-hour, if residual is less than 200 cc then we will remove the NG tube. S/p IV fluid for hydration S/p PPI for GI ppx.  Patient started passing gas and had BM. 10/28 x-ray: No abnormal bowel dilatation. Right upper lobe airspace opacity is noted concerning for pneumonia or atelectasis. SBO resolved, diet was gradually advanced by general surgery, patient tolerated well.  Patient was cleared by general surgery to discharge and follow-up as an outpatient if needed.  Patient agreed with discharge planning  # h/o pulmonary embolism Patient was on Coumadin which has been held S/p heparin IV infusion, Lovenox one-shot was given before discharge to bridge with Coumadin.  Patient was advised to take Coumadin at home and monitor  INR as an outpatient.  Follow with PCP. # HTN, Chronic HFpEF, no symptoms of exacerbation Troponin negative. S/p gentle hydration. Continue irbesartan 150 mg daily # NIDDM T2: Held home medications, s/p Continue NovoLog sliding scale.  Resumed home regimen on discharge, patient was advised to monitor CBG and continue diabetic diet. # History of lung cancer s/p radiation therapy, patient is following  oncology as an outpatient. No active issues   Body mass index is 36.55 kg/m.  Nutrition Interventions:  - Patient was instructed, not to drive, operate heavy machinery, perform activities at heights, swimming or participation in water activities or provide baby sitting services while on Pain, Sleep and Anxiety Medications; until her outpatient Physician has advised to do so again.  - Also recommended to not to take more than prescribed Pain, Sleep and Anxiety Medications.  Patient was ambulatory without any assistance. On the day of the discharge the patient's vitals were stable, and no other acute medical condition were reported by patient. the patient was felt safe to be discharge at Home.  Consultants: General Surgery Procedures: None  Discharge Exam: General: Appear in no distress, no Rash; Oral Mucosa Clear, moist. Cardiovascular: S1 and S2 Present, no Murmur, Respiratory: normal respiratory effort, Bilateral Air entry present and no Crackles, no wheezes Abdomen: Bowel Sound present, Soft and no tenderness, no hernia Extremities: no Pedal edema, no calf tenderness Neurology: alert and oriented to time, place, and person affect appropriate.  Filed Weights   03/20/23 2013 03/21/23 0200 03/21/23 0355  Weight: 90.7 kg 92.9 kg 93.6 kg   Vitals:   03/22/23 0443 03/22/23 0844  BP: (!) 139/56 135/73  Pulse: 74 77  Resp: 20 18  Temp: 98.2 F (36.8 C) 97.8 F (36.6 C)  SpO2: 98% 100%    DISCHARGE MEDICATION: Allergies as of 03/22/2023       Reactions   Adhesive [tape] Other (See Comments)   Pt reports, when removing tape from skin most tapes pull her skin off. Ok to use paper tape   Other Hives   Chlorhexadine wipes/CHG wipes,    Latex Rash   Exam gloves/ does not react around elastic and lips don't swell when blowing balloons   Morphine And Codeine Nausea And Vomiting        Medication List     STOP taking these medications    citalopram 10 MG tablet Commonly  known as: CELEXA       TAKE these medications    calcium carbonate 600 MG Tabs tablet Commonly known as: Calcium 600 Take 1 tablet (600 mg total) by mouth 2 (two) times daily with a meal.   candesartan 16 MG tablet Commonly known as: ATACAND Take 16 mg by mouth in the morning.   cholecalciferol 25 MCG (1000 UNIT) tablet Commonly known as: VITAMIN D3 Take 1,000 Units by mouth in the morning.   cyclobenzaprine 10 MG tablet Commonly known as: FLEXERIL Take 10 mg by mouth at bedtime.   Dulaglutide 1.5 MG/0.5ML Soaj Inject 1.5 mg into the skin every Saturday.   gabapentin 100 MG capsule Commonly known as: NEURONTIN Take 200 mg by mouth with breakfast, with lunch, and with evening meal.   gabapentin 300 MG capsule Commonly known as: NEURONTIN Take 300 mg by mouth at bedtime.   glipiZIDE 10 MG tablet Commonly known as: GLUCOTROL Take 10 mg by mouth daily before breakfast.   HYDROcodone-acetaminophen 5-325 MG tablet Commonly known as: NORCO/VICODIN Take 1 tablet by mouth every 6 (six) hours as needed  for moderate pain.   Magnesium Oxide -Mg Supplement 500 MG Tabs Take by mouth.   meclizine 25 MG tablet Commonly known as: ANTIVERT Take 25 mg by mouth 3 (three) times daily as needed for dizziness.   oxybutynin 5 MG tablet Commonly known as: DITROPAN Take 1 tablet by mouth 2 (two) times daily.   rOPINIRole 0.5 MG tablet Commonly known as: REQUIP Take 0.5 mg by mouth at bedtime.   Vitamin B-12 5000 MCG Tbdp Take 5,000 mcg by mouth in the morning.   warfarin 6 MG tablet Commonly known as: COUMADIN Take 6 mg by mouth daily.       Allergies  Allergen Reactions   Adhesive [Tape] Other (See Comments)    Pt reports, when removing tape from skin most tapes pull her skin off. Ok to use paper tape   Other Hives    Chlorhexadine wipes/CHG wipes,    Latex Rash    Exam gloves/ does not react around elastic and lips don't swell when blowing balloons   Morphine And  Codeine Nausea And Vomiting   Discharge Instructions     Call MD for:  difficulty breathing, headache or visual disturbances   Complete by: As directed    Call MD for:  extreme fatigue   Complete by: As directed    Call MD for:  persistant dizziness or light-headedness   Complete by: As directed    Call MD for:  persistant nausea and vomiting   Complete by: As directed    Call MD for:  severe uncontrolled pain   Complete by: As directed    Diet - low sodium heart healthy   Complete by: As directed    Discharge instructions   Complete by: As directed    Follow-up with PCP in 1 week, continue to monitor INR and continue Coumadin. Follow-up with general surgery as needed. Follow-up with oncologist in 1 to 2 weeks for possible recurrence of lung cancer   Increase activity slowly   Complete by: As directed        The results of significant diagnostics from this hospitalization (including imaging, microbiology, ancillary and laboratory) are listed below for reference.    Significant Diagnostic Studies: DG ABD ACUTE 2+V W 1V CHEST  Result Date: 03/21/2023 CLINICAL DATA:  Postoperative ileus. EXAM: DG ABDOMEN ACUTE WITH 1 VIEW CHEST COMPARISON:  March 20, 2023. FINDINGS: There is no evidence of dilated bowel loops or free intraperitoneal air. No radiopaque calculi or other significant radiographic abnormality is seen. Heart size and mediastinal contours are within normal limits. Right upper lobe airspace opacity is noted consistent with pneumonia or atelectasis. Nasogastric tube tip is seen in expected position of distal stomach. IMPRESSION: No abnormal bowel dilatation. Right upper lobe airspace opacity is noted concerning for pneumonia or atelectasis. Electronically Signed   By: Lupita Raider M.D.   On: 03/21/2023 09:12   US ABDOMEN LIMITED RUQ (LIVER/GB)  Result Date: 03/20/2023 CLINICAL DATA:  Abdomen pain EXAM: ULTRASOUND ABDOMEN LIMITED RIGHT UPPER QUADRANT COMPARISON:  CT  03/20/2023 FINDINGS: Gallbladder: Gallstones.  Negative sonographic Murphy.  Normal wall thickness Common bile duct: Diameter: 6.8 mm Liver: Echogenic liver parenchyma. No focal hepatic abnormality. Portal vein is patent on color Doppler imaging with normal direction of blood flow towards the liver. Other: None. IMPRESSION: 1. Cholelithiasis without sonographic evidence for acute cholecystitis. 2. Upper normal common bile duct measuring 6.8 mm. Correlate with LFTs. 3. Echogenic liver parenchyma consistent with hepatic steatosis. Electronically Signed   By:  Jasmine Pang M.D.   On: 03/20/2023 23:35   DG Abd Portable 1 View  Result Date: 03/20/2023 CLINICAL DATA:  Enteric catheter placement EXAM: PORTABLE ABDOMEN - 1 VIEW COMPARISON:  03/20/2023 FINDINGS: Frontal view of the lower chest and upper abdomen demonstrates enteric catheter passing below diaphragm, tip projecting over the gastric body. Stable right perihilar consolidation consistent with patient's history of known lung cancer. IMPRESSION: 1. Enteric catheter tip projecting over the gastric body. Electronically Signed   By: Sharlet Salina M.D.   On: 03/20/2023 20:44   CT Angio Chest/Abd/Pel for Dissection W and/or Wo Contrast  Result Date: 03/20/2023 CLINICAL DATA:  Chest and abdominal pain, acute aortic syndrome suspected, history of lung cancer * Tracking Code: BO * EXAM: CT ANGIOGRAPHY CHEST, ABDOMEN AND PELVIS TECHNIQUE: Non-contrast CT of the chest was initially obtained. Multidetector CT imaging through the chest, abdomen and pelvis was performed using the standard protocol during bolus administration of intravenous contrast. Multiplanar reconstructed images and MIPs were obtained and reviewed to evaluate the vascular anatomy. RADIATION DOSE REDUCTION: This exam was performed according to the departmental dose-optimization program which includes automated exposure control, adjustment of the mA and/or kV according to patient size and/or use of  iterative reconstruction technique. CONTRAST:  OMNIPAQUE IOHEXOL 350 MG/ML SOLN COMPARISON:  CT chest, 01/14/2023, CT abdomen pelvis, 11/20/2021 FINDINGS: CTA CHEST FINDINGS VASCULAR Aorta: Satisfactory opacification of the aorta. Normal contour and caliber of the thoracic aorta. No evidence of aneurysm, dissection, or other acute aortic pathology. Mild aortic atherosclerosis Cardiovascular: No evidence of pulmonary embolism on limited non-tailored examination. Normal heart size. Scattered left coronary artery calcifications. No pericardial effusion. Review of the MIP images confirms the above findings. NON VASCULAR Mediastinum/Nodes: Unchanged prominent pretracheal and right hilar lymph nodes (series 5, image 42). Thyroid gland, trachea, and esophagus demonstrate no significant findings. Lungs/Pleura: Unchanged post treatment appearance of the chest, with dense perihilar consolidation and fibrosis of the right lung (series 6, image 48). There are again noted multiple small pleural nodules, particularly about the right lung base (series 6, image 96, as well as along the superior margin of the treated mass in the right upper lobe (series 6, image 46). No pleural effusion or pneumothorax. Musculoskeletal: No chest wall abnormality. No acute osseous findings. Review of the MIP images confirms the above findings. CTA ABDOMEN AND PELVIS FINDINGS VASCULAR Normal contour and caliber of the abdominal aorta. No evidence of aneurysm, dissection, or other acute aortic pathology. Standard branching pattern of the abdominal aorta with solitary bilateral renal arteries. Mild aortic atherosclerosis. Review of the MIP images confirms the above findings. NON-VASCULAR Hepatobiliary: No solid liver abnormality is seen. Small gallstones. No gallbladder wall thickening, or biliary dilatation. Pancreas: Unremarkable. No pancreatic ductal dilatation or surrounding inflammatory changes. Spleen: Normal in size without significant  abnormality. Adrenals/Urinary Tract: Adrenal glands are unremarkable. Kidneys are normal, without renal calculi, solid lesion, or hydronephrosis. Bladder is unremarkable. Stomach/Bowel: Stomach is within normal limits. Status post appendectomy. Mildly distended, fecalized loops of distal small bowel in the mid low abdomen measuring up to 4.1 cm in caliber, with tethered appearance and relatively abrupt caliber change, several loops of sharply decompressed terminal ileum (series 5, image 214). There is however gas and stool present throughout the colon to the rectum. Lymphatic: No enlarged abdominal or pelvic lymph nodes. Reproductive: No mass or other significant abnormality. Other: Midline ventral hernia containing a single nonobstructed loop of transverse colon (series 5, image 161). No ascites. Musculoskeletal: No acute osseous  findings. IMPRESSION: 1. Normal contour and caliber of the thoracic and abdominal aorta. No evidence of aneurysm, dissection, or other acute aortic pathology. Mild aortic atherosclerosis. 2. Mildly distended, fecalized loops of distal small bowel in the mid low abdomen, with tethered appearance and relatively abrupt caliber change, several loops of sharply decompressed terminal ileum. There is however gas and stool present throughout the colon to the rectum. Findings are consistent with partial or developing small bowel obstruction, possibly due to adhesions. Overall appearance and configuration is similar to prior examinations. 3. Unchanged post treatment appearance of the chest, with dense perihilar consolidation and fibrosis of the right lung. There are again noted multiple small pleural nodules, particularly about the right lung base, as well as along the superior margin of the treated mass in the right upper lobe, as noted on prior restaging examination dated 01/14/2023. These are worrisome for recurrent/metastatic malignancy. 4. Unchanged prominent pretracheal and right hilar lymph  nodes. 5. Midline ventral hernia containing a single nonobstructed loop of transverse colon. 6. Cholelithiasis. 7. Coronary artery disease. Aortic Atherosclerosis (ICD10-I70.0). Electronically Signed   By: Jearld Lesch M.D.   On: 03/20/2023 19:52   DG Chest 2 View  Result Date: 03/20/2023 CLINICAL DATA:  Chest pain. History of treated lung cancer status post radiation. EXAM: CHEST - 2 VIEW COMPARISON:  February 23, 2021, January 14, 2023 FINDINGS: The cardiomediastinal silhouette is unchanged in contour.Revisualization of a band like opacity in the RIGHT mid lung most consistent with post radiation change, grossly similar in comparison to prior CT. Atherosclerotic calcifications. No pleural effusion. No pneumothorax. No acute pleuroparenchymal abnormality. Visualized abdomen is unremarkable. Multilevel degenerative changes of the thoracic spine. IMPRESSION: 1. No acute cardiopulmonary abnormality. 2. Post radiation changes in the RIGHT mid lung. Electronically Signed   By: Meda Klinefelter M.D.   On: 03/20/2023 18:29   DG PAIN CLINIC C-ARM 1-60 MIN NO REPORT  Result Date: 03/15/2023 Fluoro was used, but no Radiologist interpretation will be provided. Please refer to "NOTES" tab for provider progress note.   Microbiology: Recent Results (from the past 240 hour(s))  MRSA Next Gen by PCR, Nasal     Status: None   Collection Time: 03/21/23  1:55 AM   Specimen: Nasal Mucosa; Nasal Swab  Result Value Ref Range Status   MRSA by PCR Next Gen NOT DETECTED NOT DETECTED Final    Comment: (NOTE) The GeneXpert MRSA Assay (FDA approved for NASAL specimens only), is one component of a comprehensive MRSA colonization surveillance program. It is not intended to diagnose MRSA infection nor to guide or monitor treatment for MRSA infections. Test performance is not FDA approved in patients less than 50 years old. Performed at [facility], [address] [place], [place] [address]       Labs: CBC: Recent Labs  Lab 03/20/23 1741 03/21/23 0520 03/22/23 0231  WBC 16.2* 9.9 8.4  HGB 14.7 13.1 12.9  HCT 44.2 39.5 38.7  MCV 86.5 86.8 86.8  PLT 290 263 226   Basic Metabolic Panel: Recent Labs  Lab 03/20/23 1741 03/21/23 0520 03/22/23 0231  NA 135 137 135  K 4.2 3.8 3.9  CL 103 106 106  CO2 21* 23 20*  GLUCOSE 211* 192* 175*  BUN 20 17 17   CREATININE 0.78 0.70 0.67  CALCIUM 9.7 8.7* 8.3*  MG  --  2.2 2.1  PHOS  --  4.2 4.0   Liver Function Tests: Recent Labs  Lab 03/20/23 1910 03/21/23 0520  AST 21  18  ALT 31 26  ALKPHOS 50 45  BILITOT 1.2 0.8  PROT 7.7 7.1  ALBUMIN 4.1 3.7   Recent Labs  Lab 03/20/23 1910  LIPASE 34   No results for input(s): "AMMONIA" in the last 168 hours. Cardiac Enzymes: No results for input(s): "CKTOTAL", "CKMB", "CKMBINDEX", "TROPONINI" in the last 168 hours. BNP (last 3 results) No results for input(s): "BNP" in the last 8760 hours. CBG: Recent Labs  Lab 03/21/23 2107 03/22/23 0044 03/22/23 0442 03/22/23 0836 03/22/23 1123  GLUCAP 212* 149* 197* 189* 282*    Time spent: 35 minutes  Signed:  Gillis Santa  [facility] 03/22/2023 1:03 PM

## 2023-03-22 NOTE — Plan of Care (Signed)
  Problem: Education: Goal: Ability to describe self-care measures that may prevent or decrease complications (Diabetes Survival Skills Education) will improve Outcome: Adequate for Discharge Goal: Individualized Educational Video(s) Outcome: Adequate for Discharge   Problem: Coping: Goal: Ability to adjust to condition or change in health will improve Outcome: Adequate for Discharge   Problem: Fluid Volume: Goal: Ability to maintain a balanced intake and output will improve Outcome: Adequate for Discharge   Problem: Health Behavior/Discharge Planning: Goal: Ability to identify and utilize available resources and services will improve Outcome: Adequate for Discharge Goal: Ability to manage health-related needs will improve Outcome: Adequate for Discharge   Problem: Metabolic: Goal: Ability to maintain appropriate glucose levels will improve Outcome: Adequate for Discharge   Problem: Nutritional: Goal: Maintenance of adequate nutrition will improve Outcome: Adequate for Discharge Goal: Progress toward achieving an optimal weight will improve Outcome: Adequate for Discharge   Problem: Skin Integrity: Goal: Risk for impaired skin integrity will decrease Outcome: Adequate for Discharge   Problem: Tissue Perfusion: Goal: Adequacy of tissue perfusion will improve Outcome: Adequate for Discharge   Problem: Education: Goal: Knowledge of General Education information will improve Description: Including pain rating scale, medication(s)/side effects and non-pharmacologic comfort measures Outcome: Adequate for Discharge   Problem: Health Behavior/Discharge Planning: Goal: Ability to manage health-related needs will improve Outcome: Adequate for Discharge   Problem: Clinical Measurements: Goal: Ability to maintain clinical measurements within normal limits will improve Outcome: Adequate for Discharge Goal: Will remain free from infection Outcome: Adequate for Discharge Goal:  Diagnostic test results will improve Outcome: Adequate for Discharge Goal: Respiratory complications will improve Outcome: Adequate for Discharge Goal: Cardiovascular complication will be avoided Outcome: Adequate for Discharge   Problem: Activity: Goal: Risk for activity intolerance will decrease Outcome: Adequate for Discharge   Problem: Nutrition: Goal: Adequate nutrition will be maintained Outcome: Adequate for Discharge   Problem: Coping: Goal: Level of anxiety will decrease Outcome: Adequate for Discharge   Problem: Elimination: Goal: Will not experience complications related to bowel motility Outcome: Adequate for Discharge Goal: Will not experience complications related to urinary retention Outcome: Adequate for Discharge   Problem: Pain Management: Goal: General experience of comfort will improve Outcome: Adequate for Discharge   Problem: Safety: Goal: Ability to remain free from injury will improve Outcome: Adequate for Discharge   Problem: Skin Integrity: Goal: Risk for impaired skin integrity will decrease Outcome: Adequate for Discharge   Pt aox4, pt has all belongings. Pt has received all DC instructions. Pt being taken home by family. Pt taken down to lobby with staff via wheelchair

## 2023-03-22 NOTE — Telephone Encounter (Signed)
RN called from patient's bedside today to schedule hospital follow-up for patient. I let patient and nurse know that I would relay this information to Dr. Smith Robert to determine whether the appointment was needed and if so, we would reach out directly to patient to schedule.   Please advise

## 2023-03-22 NOTE — Progress Notes (Signed)
PHARMACY - ANTICOAGULATION CONSULT NOTE  Pharmacy Consult for Heparin Infusion Indication: pulmonary embolus  Allergies  Allergen Reactions   Adhesive [Tape] Other (See Comments)    Pt reports, when removing tape from skin most tapes pull her skin off. Ok to use paper tape   Other Hives    Chlorhexadine wipes/CHG wipes,    Latex Rash    Exam gloves/ does not react around elastic and lips don't swell when blowing balloons   Morphine And Codeine Nausea And Vomiting    Patient Measurements: Height: 5\' 3"  (160 cm) Weight: 93.6 kg (206 lb 5.6 oz) IBW/kg (Calculated) : 52.4 Heparin Dosing Weight: 73.7 kg   Vital Signs: Temp: 97.8 F (36.6 C) (10/28 2021) Temp Source: Oral (10/28 2021) BP: 107/53 (10/28 2021) Pulse Rate: 79 (10/28 2021)  Labs: Recent Labs    03/20/23 1741 03/20/23 1910 03/21/23 0520 03/21/23 1305 03/22/23 0231  HGB 14.7  --  13.1  --  12.9  HCT 44.2  --  39.5  --  38.7  PLT 290  --  263  --  226  APTT  --   --   --  40*  --   LABPROT  --  15.8*  --  16.7* 16.7*  INR  --  1.2  --  1.3* 1.3*  HEPARINUNFRC  --   --   --   --  0.73*  CREATININE 0.78  --  0.70  --  0.67  TROPONINIHS 4 4  --   --   --     Estimated Creatinine Clearance: 70.2 mL/min (by C-G formula based on SCr of 0.67 mg/dL).   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, 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   CHF (congestive heart failure) (HCC)    followed colon resection, "wouldn't let me get up"   Diabetes (HCC)    type 2   Diverticulitis 2018   History of being hospitalized    1 month ago for 3 days, vomiting, and kink in upper intestine   History of hiatal hernia    Hypertension    Osteoporosis    PE (pulmonary thromboembolism) (HCC)    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HCC)    Vertigo     Assessment: Patient is a 72 year old female with a past medical history of umbilical hernia, diabetes mellitus  type 2, hypertension, history of PE, arthritis, and congestive heart failure presenting with abdominal pain 2/2 SBO. Prior to admission, patient was on warfarin 6 mg daily for a PE. Pharmacy has been consulted to initiate patient on a heparin infusion until patient has been cleared by surgery.  Patient was originally initiated on therapeutic enoxaparin with the last dose 10/28 @ 0830.  No signs/symptoms of bleeding noted. Hgb 13.1. PLT 263.  Baseline aPTT and INR ordered.  Goal of Therapy:  Heparin level 0.3-0.7 units/ml Monitor platelets by anticoagulation protocol: Yes  10/29 0231 HL 0.73, supratherapeutic   Plan:  Decrease heparin infusion rate to 1100 units/hr Recheck  heparin level in 8 hours after rate change Follow CBC daily   Otelia Sergeant, PharmD, [facility] 03/22/2023 3:16 AM

## 2023-03-22 NOTE — Progress Notes (Signed)
CC: SBO Subjective: Reports doing well today.  She had a bowel movement last night and has continued to pass a lot of gas.  She denies any nausea or bloating.  She denies any vomiting after her NG tube was removed.  She tolerated a full liquid diet  Objective: Vital signs in last 24 hours: Temp:  [97.8 F (36.6 C)-98.2 F (36.8 C)] 97.8 F (36.6 C) (10/29 0844) Pulse Rate:  [74-79] 77 (10/29 0844) Resp:  [18-20] 18 (10/29 0844) BP: (107-139)/(53-73) 135/73 (10/29 0844) SpO2:  [97 %-100 %] 100 % (10/29 0844) Last BM Date : 03/20/23  Intake/Output from previous day: 10/28 0701 - 10/29 0700 In: 144.2 [I.V.:144.2] Out: 600 [Urine:600] Intake/Output this shift: No intake/output data recorded.  Physical exam:  Abdomen is soft, obese, chronically incarcerated colon.  No distention  Lab Results: CBC  Recent Labs    03/21/23 0520 03/22/23 0231  WBC 9.9 8.4  HGB 13.1 12.9  HCT 39.5 38.7  PLT 263 226   BMET Recent Labs    03/21/23 0520 03/22/23 0231  NA 137 135  K 3.8 3.9  CL 106 106  CO2 23 20*  GLUCOSE 192* 175*  BUN 17 17  CREATININE 0.70 0.67  CALCIUM 8.7* 8.3*   PT/INR Recent Labs    03/21/23 1305 03/22/23 0231  LABPROT 16.7* 16.7*  INR 1.3* 1.3*   ABG No results for input(s): "PHART", "HCO3" in the last 72 hours.  Invalid input(s): "PCO2", "PO2"  Studies/Results: DG ABD ACUTE 2+V W 1V CHEST  Result Date: 03/21/2023 CLINICAL DATA:  Postoperative ileus. EXAM: DG ABDOMEN ACUTE WITH 1 VIEW CHEST COMPARISON:  March 20, 2023. FINDINGS: There is no evidence of dilated bowel loops or free intraperitoneal air. No radiopaque calculi or other significant radiographic abnormality is seen. Heart size and mediastinal contours are within normal limits. Right upper lobe airspace opacity is noted consistent with pneumonia or atelectasis. Nasogastric tube tip is seen in expected position of distal stomach. IMPRESSION: No abnormal bowel dilatation. Right upper lobe  airspace opacity is noted concerning for pneumonia or atelectasis. Electronically Signed   By: Lupita Raider M.D.   On: 03/21/2023 09:12   US ABDOMEN LIMITED RUQ (LIVER/GB)  Result Date: 03/20/2023 CLINICAL DATA:  Abdomen pain EXAM: ULTRASOUND ABDOMEN LIMITED RIGHT UPPER QUADRANT COMPARISON:  CT 03/20/2023 FINDINGS: Gallbladder: Gallstones.  Negative sonographic Murphy.  Normal wall thickness Common bile duct: Diameter: 6.8 mm Liver: Echogenic liver parenchyma. No focal hepatic abnormality. Portal vein is patent on color Doppler imaging with normal direction of blood flow towards the liver. Other: None. IMPRESSION: 1. Cholelithiasis without sonographic evidence for acute cholecystitis. 2. Upper normal common bile duct measuring 6.8 mm. Correlate with LFTs. 3. Echogenic liver parenchyma consistent with hepatic steatosis. Electronically Signed   By: Jasmine Pang M.D.   On: 03/20/2023 23:35   DG Abd Portable 1 View  Result Date: 03/20/2023 CLINICAL DATA:  Enteric catheter placement EXAM: PORTABLE ABDOMEN - 1 VIEW COMPARISON:  03/20/2023 FINDINGS: Frontal view of the lower chest and upper abdomen demonstrates enteric catheter passing below diaphragm, tip projecting over the gastric body. Stable right perihilar consolidation consistent with patient's history of known lung cancer. IMPRESSION: 1. Enteric catheter tip projecting over the gastric body. Electronically Signed   By: Sharlet Salina M.D.   On: 03/20/2023 20:44   CT Angio Chest/Abd/Pel for Dissection W and/or Wo Contrast  Result Date: 03/20/2023 CLINICAL DATA:  Chest and abdominal pain, acute aortic syndrome suspected, history of lung  cancer * Tracking Code: BO * EXAM: CT ANGIOGRAPHY CHEST, ABDOMEN AND PELVIS TECHNIQUE: Non-contrast CT of the chest was initially obtained. Multidetector CT imaging through the chest, abdomen and pelvis was performed using the standard protocol during bolus administration of intravenous contrast. Multiplanar  reconstructed images and MIPs were obtained and reviewed to evaluate the vascular anatomy. RADIATION DOSE REDUCTION: This exam was performed according to the departmental dose-optimization program which includes automated exposure control, adjustment of the mA and/or kV according to patient size and/or use of iterative reconstruction technique. CONTRAST:  OMNIPAQUE IOHEXOL 350 MG/ML SOLN COMPARISON:  CT chest, 01/14/2023, CT abdomen pelvis, 11/20/2021 FINDINGS: CTA CHEST FINDINGS VASCULAR Aorta: Satisfactory opacification of the aorta. Normal contour and caliber of the thoracic aorta. No evidence of aneurysm, dissection, or other acute aortic pathology. Mild aortic atherosclerosis Cardiovascular: No evidence of pulmonary embolism on limited non-tailored examination. Normal heart size. Scattered left coronary artery calcifications. No pericardial effusion. Review of the MIP images confirms the above findings. NON VASCULAR Mediastinum/Nodes: Unchanged prominent pretracheal and right hilar lymph nodes (series 5, image 42). Thyroid gland, trachea, and esophagus demonstrate no significant findings. Lungs/Pleura: Unchanged post treatment appearance of the chest, with dense perihilar consolidation and fibrosis of the right lung (series 6, image 48). There are again noted multiple small pleural nodules, particularly about the right lung base (series 6, image 96, as well as along the superior margin of the treated mass in the right upper lobe (series 6, image 46). No pleural effusion or pneumothorax. Musculoskeletal: No chest wall abnormality. No acute osseous findings. Review of the MIP images confirms the above findings. CTA ABDOMEN AND PELVIS FINDINGS VASCULAR Normal contour and caliber of the abdominal aorta. No evidence of aneurysm, dissection, or other acute aortic pathology. Standard branching pattern of the abdominal aorta with solitary bilateral renal arteries. Mild aortic atherosclerosis. Review of the MIP  images confirms the above findings. NON-VASCULAR Hepatobiliary: No solid liver abnormality is seen. Small gallstones. No gallbladder wall thickening, or biliary dilatation. Pancreas: Unremarkable. No pancreatic ductal dilatation or surrounding inflammatory changes. Spleen: Normal in size without significant abnormality. Adrenals/Urinary Tract: Adrenal glands are unremarkable. Kidneys are normal, without renal calculi, solid lesion, or hydronephrosis. Bladder is unremarkable. Stomach/Bowel: Stomach is within normal limits. Status post appendectomy. Mildly distended, fecalized loops of distal small bowel in the mid low abdomen measuring up to 4.1 cm in caliber, with tethered appearance and relatively abrupt caliber change, several loops of sharply decompressed terminal ileum (series 5, image 214). There is however gas and stool present throughout the colon to the rectum. Lymphatic: No enlarged abdominal or pelvic lymph nodes. Reproductive: No mass or other significant abnormality. Other: Midline ventral hernia containing a single nonobstructed loop of transverse colon (series 5, image 161). No ascites. Musculoskeletal: No acute osseous findings. IMPRESSION: 1. Normal contour and caliber of the thoracic and abdominal aorta. No evidence of aneurysm, dissection, or other acute aortic pathology. Mild aortic atherosclerosis. 2. Mildly distended, fecalized loops of distal small bowel in the mid low abdomen, with tethered appearance and relatively abrupt caliber change, several loops of sharply decompressed terminal ileum. There is however gas and stool present throughout the colon to the rectum. Findings are consistent with partial or developing small bowel obstruction, possibly due to adhesions. Overall appearance and configuration is similar to prior examinations. 3. Unchanged post treatment appearance of the chest, with dense perihilar consolidation and fibrosis of the right lung. There are again noted multiple small  pleural nodules, particularly about the right  lung base, as well as along the superior margin of the treated mass in the right upper lobe, as noted on prior restaging examination dated 01/14/2023. These are worrisome for recurrent/metastatic malignancy. 4. Unchanged prominent pretracheal and right hilar lymph nodes. 5. Midline ventral hernia containing a single nonobstructed loop of transverse colon. 6. Cholelithiasis. 7. Coronary artery disease. Aortic Atherosclerosis (ICD10-I70.0). Electronically Signed   By: Jearld Lesch M.D.   On: 03/20/2023 19:52   DG Chest 2 View  Result Date: 03/20/2023 CLINICAL DATA:  Chest pain. History of treated lung cancer status post radiation. EXAM: CHEST - 2 VIEW COMPARISON:  February 23, 2021, January 14, 2023 FINDINGS: The cardiomediastinal silhouette is unchanged in contour.Revisualization of a band like opacity in the RIGHT mid lung most consistent with post radiation change, grossly similar in comparison to prior CT. Atherosclerotic calcifications. No pleural effusion. No pneumothorax. No acute pleuroparenchymal abnormality. Visualized abdomen is unremarkable. Multilevel degenerative changes of the thoracic spine. IMPRESSION: 1. No acute cardiopulmonary abnormality. 2. Post radiation changes in the RIGHT mid lung. Electronically Signed   By: Meda Klinefelter M.D.   On: 03/20/2023 18:29    Anti-infectives: Anti-infectives (From admission, onward)    None       Assessment/Plan: 72 y.o. female with possible small bowel obstruction; however, favor more likely ileus secondary to chronic pain medications, complicated by extensive previous abdominal surgery, Hx of Lung CA s/p radiation, CHF, DM, PE on anticoagulation   She has started to pass flatus and has had a bowel movement.  She is tolerating full liquid diet.  I have placed her on a soft diet for this morning.  If she tolerates this and continues to have bowel function she can be discharged from a surgical  standpoint today.  Rest of management per primary team   Baker Pierini, M.D. [facility]

## 2023-03-22 NOTE — Telephone Encounter (Signed)
Pet first and then see me

## 2023-03-22 NOTE — Addendum Note (Signed)
Addended by: Glory Buff on: 03/22/2023 04:21 PM   Modules accepted: Orders

## 2023-03-23 ENCOUNTER — Ambulatory Visit: Payer: PPO | Admitting: Surgery

## 2023-03-29 ENCOUNTER — Telehealth: Payer: Self-pay | Admitting: Oncology

## 2023-03-29 ENCOUNTER — Ambulatory Visit: Payer: PPO

## 2023-03-29 NOTE — Telephone Encounter (Signed)
Pt stated she was at the hospital and had a CT done on Sunday November 3rd, 2024. Pt wants to know if she can cancel the CT scheduled for Monday April 04, 2023.   Please call pt back and let her know.

## 2023-03-31 DIAGNOSIS — Z09 Encounter for follow-up examination after completed treatment for conditions other than malignant neoplasm: Secondary | ICD-10-CM | POA: Diagnosis not present

## 2023-03-31 DIAGNOSIS — M51369 Other intervertebral disc degeneration, lumbar region without mention of lumbar back pain or lower extremity pain: Secondary | ICD-10-CM | POA: Diagnosis not present

## 2023-03-31 DIAGNOSIS — Z86711 Personal history of pulmonary embolism: Secondary | ICD-10-CM | POA: Diagnosis not present

## 2023-03-31 DIAGNOSIS — I10 Essential (primary) hypertension: Secondary | ICD-10-CM | POA: Diagnosis not present

## 2023-03-31 DIAGNOSIS — Z7901 Long term (current) use of anticoagulants: Secondary | ICD-10-CM | POA: Diagnosis not present

## 2023-03-31 DIAGNOSIS — Z86718 Personal history of other venous thrombosis and embolism: Secondary | ICD-10-CM | POA: Diagnosis not present

## 2023-03-31 DIAGNOSIS — Z9889 Other specified postprocedural states: Secondary | ICD-10-CM | POA: Diagnosis not present

## 2023-03-31 DIAGNOSIS — Z23 Encounter for immunization: Secondary | ICD-10-CM | POA: Diagnosis not present

## 2023-03-31 DIAGNOSIS — Z6834 Body mass index (BMI) 34.0-34.9, adult: Secondary | ICD-10-CM | POA: Diagnosis not present

## 2023-03-31 DIAGNOSIS — Z8719 Personal history of other diseases of the digestive system: Secondary | ICD-10-CM | POA: Diagnosis not present

## 2023-04-04 ENCOUNTER — Ambulatory Visit: Payer: PPO

## 2023-04-05 ENCOUNTER — Ambulatory Visit: Payer: PPO | Admitting: Pain Medicine

## 2023-04-06 ENCOUNTER — Ambulatory Visit
Admission: RE | Admit: 2023-04-06 | Discharge: 2023-04-06 | Disposition: A | Discharge status: Home / Self Care | Payer: PPO | Source: Ambulatory Visit | Attending: Oncology | Admitting: Oncology

## 2023-04-06 DIAGNOSIS — C3490 Malignant neoplasm of unspecified part of unspecified bronchus or lung: Secondary | ICD-10-CM | POA: Diagnosis not present

## 2023-04-06 DIAGNOSIS — K439 Ventral hernia without obstruction or gangrene: Secondary | ICD-10-CM | POA: Diagnosis not present

## 2023-04-06 DIAGNOSIS — I700 Atherosclerosis of aorta: Secondary | ICD-10-CM | POA: Diagnosis not present

## 2023-04-06 DIAGNOSIS — C3491 Malignant neoplasm of unspecified part of right bronchus or lung: Secondary | ICD-10-CM | POA: Insufficient documentation

## 2023-04-06 DIAGNOSIS — K8020 Calculus of gallbladder without cholecystitis without obstruction: Secondary | ICD-10-CM | POA: Insufficient documentation

## 2023-04-06 DIAGNOSIS — M4317 Spondylolisthesis, lumbosacral region: Secondary | ICD-10-CM | POA: Diagnosis not present

## 2023-04-06 DIAGNOSIS — M4316 Spondylolisthesis, lumbar region: Secondary | ICD-10-CM | POA: Insufficient documentation

## 2023-04-06 LAB — GLUCOSE, CAPILLARY: Glucose-Capillary: 94 mg/dL (ref 70–99)

## 2023-04-06 MED ORDER — FLUDEOXYGLUCOSE F - 18 (FDG) INJECTION
10.7000 | Freq: Once | INTRAVENOUS | Status: AC | PRN
  Administered 2023-04-06: 10.32 via INTRAVENOUS

## 2023-04-07 ENCOUNTER — Encounter: Payer: Self-pay | Admitting: Pain Medicine

## 2023-04-07 ENCOUNTER — Ambulatory Visit: Payer: PPO | Attending: Pain Medicine | Admitting: Pain Medicine

## 2023-04-07 VITALS — BP 133/68 | HR 103 | Temp 97.3°F | Resp 18 | Ht 63.0 in | Wt 203.0 lb

## 2023-04-07 DIAGNOSIS — M5442 Lumbago with sciatica, left side: Secondary | ICD-10-CM | POA: Diagnosis not present

## 2023-04-07 DIAGNOSIS — G8929 Other chronic pain: Secondary | ICD-10-CM | POA: Diagnosis not present

## 2023-04-07 DIAGNOSIS — M5441 Lumbago with sciatica, right side: Secondary | ICD-10-CM | POA: Diagnosis not present

## 2023-04-07 DIAGNOSIS — M5459 Other low back pain: Secondary | ICD-10-CM | POA: Insufficient documentation

## 2023-04-07 DIAGNOSIS — M47816 Spondylosis without myelopathy or radiculopathy, lumbar region: Secondary | ICD-10-CM | POA: Diagnosis not present

## 2023-04-07 DIAGNOSIS — Z09 Encounter for follow-up examination after completed treatment for conditions other than malignant neoplasm: Secondary | ICD-10-CM | POA: Diagnosis not present

## 2023-04-07 DIAGNOSIS — M47817 Spondylosis without myelopathy or radiculopathy, lumbosacral region: Secondary | ICD-10-CM | POA: Diagnosis not present

## 2023-04-07 NOTE — Progress Notes (Signed)
Safety precautions to be maintained throughout the outpatient stay will include: orient to surroundings, keep bed in low position, maintain call bell within reach at all times, provide assistance with transfer out of bed and ambulation.  

## 2023-04-07 NOTE — Progress Notes (Signed)
PROVIDER NOTE: Information contained herein reflects review and annotations entered in association with encounter. Interpretation of such information and data should be left to medically-trained personnel. Information provided to patient can be located elsewhere in the medical record under "Patient Instructions". Document created using STT-dictation technology, any transcriptional errors that may result from process are unintentional.    Patient: Kara [name]  Service Category: E/M  Provider: Oswaldo Done, MD  DOB: 1951-04-15  DOS: 04/07/2023  Referring Provider: Barbette Reichmann, MD  MRN: [id number]  Specialty: Interventional Pain Management  PCP: Barbette Reichmann, MD  Type: Established Patient  Setting: Ambulatory outpatient    Location: Office  Delivery: Face-to-face     HPI  Kara [name] [place], a 72 y.o. year old female, is here today because of her Chronic bilateral low back pain with bilateral sciatica [M54.42, M54.41, G89.29]. Kara [name] primary complain today is No chief complaint on file.  Pertinent problems: Ms. 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); Lumbar pars defect (L3 and L4) (Bilateral); Diabetic peripheral neuropathy (HCC); Chronic lower extremity pain (2ry area of Pain) (Bilateral); Chronic radicular pain of lower extremity; Osteoarthritis of hip (Right); Chronic low back pain (1ry area of Pain) (Bilateral) w/o  sciatica; Spondylosis without myelopathy or radiculopathy, lumbosacral region; Other specified dorsopathies, sacral and sacrococcygeal region; Secondary osteoarthritis of multiple sites; Sacral insufficiency fracture; DDD (degenerative disc disease), lumbosacral; Coccygodynia; Chronic sacroiliac joint pain (Left); Osteoarthritis of lumbar spine; Spondylolisthesis, lumbosacral region; Abnormal MRI, lumbar spine (08/09/2019); Spasm of muscle of lower back; Chronic hip pain (Bilateral) (R>L); Osteoarthritis of hips (Bilateral); Greater trochanteric bursitis (Right); Chronic hip pain (Right); Chronic low back pain (Right) w/o sciatica; Greater trochanteric bursitis (Left); Other bursitis of hip, not elsewhere classified (gluteus medius bursa) (Left); Lumbar facet joint pain; and Piriformis muscle pain (Right) on their pertinent problem list. Pain Assessment: Severity of Chronic pain is reported as a 8 /10. Location: Back Lower/radiates dwon back of both legs to calf. Onset: More than a month ago. Quality: Aching, Sharp. Timing: Constant. Modifying factor(s): rest. Vitals:  height is 5\' 3"  (1.6 m) and weight is 203 lb (92.1 kg). Her temperature is 97.3 F (36.3 C) (abnormal). Her blood pressure is 133/68 and her pulse is 103 (abnormal). Her respiration is 18 and oxygen saturation is 97%.  BMI: Estimated body mass index is 35.96 kg/m as calculated from the following:   Height as of this encounter: 5\' 3"  (1.6 m).   Weight as of this encounter: 203 lb (92.1 kg). Last encounter: 03/03/2023. Last procedure: 03/15/2023.  Reason for encounter: post-procedure evaluation and assessment.  Today the patient returns after having had a bilateral lumbar facet MBB on 03/15/2023.  She describes having attained only 25% relief of the pain for the duration of local anesthetic followed by an ongoing 50% improvement of her low back pain.  Today I have taken the time to interpret these results for the patient and what it means.  I  have explained to the patient that in the process of interpreting this injections will look at the initial postprocedure phase  while the local anesthetic is in effect and we also look at a more distal postprocedure.  When there is the possibility that the patient has attained benefit from the steroids.  Today I have explained to the patient the role of the local anesthetics and the steroids and the diagnosis of some of these conditions.  While the local anesthetics are short-lived, there is spread is limited as well as its effects and because of the reliability they are used to determine if the injected site is in fact where the pain was coming from.  In her case in particular she attained only 25% relief of her pain indicating that only 25% of her pain was coming from the injected areas namely the lumbar facet joints.  This has not always been the case and therefore this would suggest her condition to be evolving and perhaps progressing with the etiology of the pain shifting to a different location.  I also explained to her that any benefit experience past the duration of the local anesthetic is likely to be secondary to the effects of the steroids and that the steroids only provide analgesic benefits once the inflammatory process for which there were given has subsided.  From that we can deduct that a patient given steroids that subsequently attains 100% relief of their pain would be a patient were this pain was being 100% sustained by an inflammatory process.  On the other hand, a patient that attains only 50% relief would suggest that only 50% of the pain was being sustained by an inflammatory process and the other 50% was likely to be coming from some type of mechanical compressive pathology.  At this point she indicates having an ongoing 50% relief of her pain which now she says it is significant.  In any case, I have explained to the patient that at this time we will simply keep her condition under  observation.  She was recommended to give [place] a call if she again has another flareup of her pain so that we can have evaluated at that time and consider all other possible alternatives.  She has also volunteered that currently her pain is such that she wakes up in the morning doing well but as the day progresses and she continues to work, her back will fatigue and that when she ends up having recurrence of some of this pain towards the end of the day.  This would suggest that this pain may be myofascial since it does follow a temporal component usually seen with muscle fatigue towards the end of the day.  All of the above information was shared with the patient who indicated having understood and accepted.  Post-procedure evaluation   Procedure: Lumbar Facet, Medial Branch Block(s) [id number]  Laterality: Bilateral  Level: L2, L3, L4, L5, and S1 Medial Branch Level(s). Injecting these levels blocks the L3-4, L4-5, and L5-S1 lumbar facet joints. Imaging: Fluoroscopic guidance Spinal ([id number]) Anesthesia: Local anesthesia (1-2% Lidocaine) Anxiolysis: IV Versed 2.0 mg Sedation: Moderate Sedation Fentanyl 0.5 mL (25 mcg) DOS: 03/15/2023 Performed by: Oswaldo Done, MD  Primary Purpose: Diagnostic/Therapeutic Indications: Low back pain severe enough to impact quality of life or function. 1. 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 Lumbar facet arthropathy (L3-4, L4-5, and L5-S1) (Bilateral)   4. Lumbar facet joint pain   5. Lumbar facet syndrome (Bilateral)   6. 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 Spondylosis without myelopathy or radiculopathy, lumbosacral region   8. Chronic anticoagulation (COUMADIN)   9. Morbid obesity with BMI of 40.0-44.9, adult (HCC)    NAS-11 Pain score:   Pre-procedure: 10-Worst pain ever/10   Post-procedure: 0-No pain/10      Effectiveness:  Initial  hour after procedure: 25 %. Subsequent 4-6 hours post-procedure: 25 %. Analgesia past initial 6 hours: 50 % (ongoing). Ongoing improvement:  Analgesic: The patient indicates having an ongoing 50% relief of her pain. Function: Somewhat improved ROM: Somewhat improved  Pharmacotherapy Assessment  Analgesic: No chronic opioid analgesics therapy prescribed by our practice, 2ry to Medication Agreement Violation. Oxycodone IR 10 mg, 1 tab PO qd,  from PCP.  (Getting oxycodone from PCP in multiple occasions, according to PMP)(11/21/20; 12/26/20; 01/16/21; 02/13/21). Opioid analgesic pharmacotherapy D/C'ed on 03/03/2021. MME: 20 mg/day.   Monitoring: [place] PMP: PDMP reviewed during this encounter.       Pharmacotherapy: No side-effects or adverse reactions reported. Compliance: No problems identified. Effectiveness: Clinically acceptable.  Valerie Salts, RN  04/07/2023 12:49 PM  Sign when Signing Visit Safety precautions to be maintained throughout the outpatient stay will include: orient to surroundings, keep bed in low position, maintain call bell within reach at all times, provide assistance with transfer out of bed and ambulation.    No results found for: "CBDTHCR" No results found for: "D8THCCBX" No results found for: "D9THCCBX"  UDS:  Summary  Date Value Ref Range Status  11/12/2020 Note  Final    Comment:    ==================================================================== [employer] [facility] (MW) ==================================================================== Test                             Result       Flag       Units  Drug Present and Declared for Prescription Verification   Norhydrocodone                 61           EXPECTED   ng/mg creat    Norhydrocodone is an expected metabolite of hydrocodone.    Tramadol                       >4202        EXPECTED   ng/mg creat   O-Desmethyltramadol            >4202        EXPECTED   ng/mg creat   N-Desmethyltramadol            1712          EXPECTED   ng/mg creat    Source of tramadol is a prescription medication. O-desmethyltramadol    and N-desmethyltramadol are expected metabolites of tramadol.  Drug Absent but Declared for Prescription Verification   Hydrocodone                    Not Detected UNEXPECTED ng/mg creat    Hydrocodone is almost always present in patients taking this drug    consistently. Absence of hydrocodone could be due to lapse of time    since the last dose or unusual pharmacokinetics (rapid metabolism).  ==================================================================== Test                      Result    Flag   Units      Ref  Range   Creatinine              119              mg/dL      >=16 ==================================================================== Declared Medications:  The flagging and interpretation on this report are based on the  following declared medications.  Unexpected results may arise from  inaccuracies in the declared medications.   **Note: The testing scope of this panel includes these medications:   Hydrocodone  Tramadol   **Note: The testing scope of this panel does not include the  following reported medications:   Acetaminophen  Alendronate (Fosamax)  Aspirin  Calcium  Candesartan  Cholecalciferol  Cyanocobalamin  Cyclobenzaprine  Dulaglutide  Gabapentin  Glipizide  Meclizine  Multivitamin  Nystatin  Ondansetron  Ropinirole  Tolterodine  Warfarin ==================================================================== For clinical consultation, please call [phone]. ====================================================================       ROS  Constitutional: Denies any fever or chills Gastrointestinal: No reported hemesis, hematochezia, vomiting, or acute GI distress Musculoskeletal: Denies any acute onset joint swelling, redness, loss of ROM, or weakness Neurological: No reported episodes of acute onset apraxia, aphasia, dysarthria,  agnosia, amnesia, paralysis, loss of coordination, or loss of consciousness  Medication Review  Dulaglutide, HYDROcodone-acetaminophen, Magnesium Oxide -Mg Supplement, Vitamin B-12, calcium carbonate, candesartan, cholecalciferol, cyclobenzaprine, gabapentin, glipiZIDE, meclizine, oxybutynin, rOPINIRole, and warfarin  History Review  Allergy: Ms. Kertis is allergic to adhesive [tape], other, latex, and morphine and codeine. Drug: Ms. Bria  reports no history of drug use. Alcohol:  reports no history of alcohol use. Tobacco:  reports that she has never smoked. She has never used smokeless tobacco. Social: Ms. Boak  reports that she has never smoked. She has never used smokeless tobacco. She reports that she does not drink alcohol and does not use drugs. Medical:  has a past medical history of Anginal pain (HCC), Arthritis, CHF (congestive heart failure) (HCC), Diabetes (HCC), Diverticulitis (2018), History of being hospitalized, History of hiatal hernia, Hypertension, Osteoporosis, PE (pulmonary thromboembolism) (HCC), 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HCC), and Vertigo. Surgical: Ms. Dibble  has a past surgical history that includes Knee surgery; Carpal tunnel release (Bilateral); Colon resection sigmoid (N/A, 11/02/2016); Colostomy (N/A, 11/02/2016); Sigmoidoscopy (N/A, 11/13/2016); PULMONARY VENOGRAPHY (N/A, 11/19/2016); Colonoscopy with propofol (N/A, 02/03/2017); Colon surgery (11/02/2016); IVC FILTER INSERTION (Right, 10/2016); Colostomy reversal (N/A, 02/24/2017); Appendectomy (N/A, 02/24/2017); Lysis of adhesion (N/A, 02/24/2017); laparoscopy (N/A, 02/24/2017); Ileo loop diversion (N/A, 02/24/2017); Sigmoidoscopy (N/A, 05/19/2017); Ileostomy closure (N/A, 06/01/2017); IVC FILTER REMOVAL (N/A, 08/09/2017); Sacroplasty (N/A, 11/08/2017); Cataract extraction w/PHACO (Left, 11/21/2018); Cataract extraction w/PHACO (Right, 12/12/2018); and Video bronchoscopy with endobronchial navigation (N/A,  02/23/2021). Family: family history includes Breast cancer in her mother; Cancer in her mother; Diabetes in her mother and sister; Heart disease in her father, mother, and sister.  Laboratory Chemistry Profile   Renal Lab Results  Component Value Date   BUN 17 03/22/2023   CREATININE 0.67 03/22/2023   GFRAA >60 07/09/2019   GFRNONAA >60 03/22/2023    Hepatic Lab Results  Component Value Date   AST 18 03/21/2023   ALT 26 03/21/2023   ALBUMIN 3.7 03/21/2023   ALKPHOS 45 03/21/2023   LIPASE 34 03/20/2023    Electrolytes Lab Results  Component Value Date   NA 135 03/22/2023   K 3.9 03/22/2023   CL 106 03/22/2023   CALCIUM 8.3 (L) 03/22/2023   MG 2.1 03/22/2023   PHOS 4.0 03/22/2023    Bone Lab Results  Component Value Date  25OHVITD1 25 (L) 11/02/2017   25OHVITD2 <1.0 11/02/2017   25OHVITD3 25 11/02/2017    Inflammation (CRP: Acute Phase) (ESR: Chronic Phase) Lab Results  Component Value Date   CRP 1.7 (H) 08/15/2020   ESRSEDRATE 12 11/02/2017   LATICACIDVEN 1.7 11/21/2021         Note: Above Lab results reviewed.  Recent Imaging Review  DG ABD ACUTE 2+V W 1V CHEST CLINICAL DATA:  Postoperative ileus.  EXAM: DG ABDOMEN ACUTE WITH 1 VIEW CHEST  COMPARISON:  March 20, 2023.  FINDINGS: There is no evidence of dilated bowel loops or free intraperitoneal air. No radiopaque calculi or other significant radiographic abnormality is seen. Heart size and mediastinal contours are within normal limits. Right upper lobe airspace opacity is noted consistent with pneumonia or atelectasis. Nasogastric tube tip is seen in expected position of distal stomach.  IMPRESSION: No abnormal bowel dilatation. Right upper lobe airspace opacity is noted concerning for pneumonia or atelectasis.  Electronically Signed   By: Lupita Raider M.D.   On: 03/21/2023 09:12 Note: Reviewed        Physical Exam  General appearance: Well nourished, well developed, and well hydrated.  In no apparent acute distress Mental status: Alert, oriented x 3 (person, place, & time)       Respiratory: No evidence of acute respiratory distress Eyes: PERLA Vitals: BP 133/68   Pulse (!) 103   Temp (!) 97.3 F (36.3 C)   Resp 18   Ht 5\' 3"  (1.6 m)   Wt 203 lb (92.1 kg)   SpO2 97%   BMI 35.96 kg/m  BMI: Estimated body mass index is 35.96 kg/m as calculated from the following:   Height as of this encounter: 5\' 3"  (1.6 m).   Weight as of this encounter: 203 lb (92.1 kg). Ideal: Ideal body weight: 52.4 kg (115 lb 8.3 oz) Adjusted ideal body weight: 68.3 kg (150 lb 8.2 oz)  Assessment   Diagnosis Status  1. 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 Lumbar facet joint pain   3. Lumbar facet syndrome (Bilateral)   4. Spondylosis without myelopathy or radiculopathy, lumbosacral region   5. Postop check    Controlled Controlled Controlled   Updated Problems: No problems updated.  Plan of Care  Problem-specific:  No problem-specific Assessment & Plan notes found for this encounter.  Ms. Elvita Robie Mccart has a current medication list which includes the following long-term medication(s): candesartan, glipizide, ropinirole, warfarin, and calcium carbonate.  Pharmacotherapy (Medications Ordered): No orders of the defined types were placed in this encounter.  Orders:  Orders Placed This Encounter  Procedures   Nursing Instructions:    Please complete this patient's postprocedure evaluation.    Scheduling Instructions:     Please complete this patient's postprocedure evaluation.   Follow-up plan:   Return if symptoms worsen or fail to improve.      Interventional Therapies  Risk  Complexity Considerations:   ANTICOAGULATION: Coumadin (Stop: 5 days  Re-start: 2hrs)  Hx DVT & PE ALLERGY: Latex Morbid obesity (class III) (BMI>40)  metabolic syndrome  NIDDM  HTN Difficult Lumbar RFA due to morbid obesity    Planned   Pending: Palliative bilateral (L3-4, L4-5, L5-S1) lumbar facet (L2-S1) MBB [id number] (03/15/2023)    Under consideration:   Therapeutic bilateral lumbar facet RFA #2 (patient indicates preferring to have only the block without radiofrequency.)   Completed:   Therapeutic bilateral lumbar facet MBB (L3-S1) x1 (04/06/2022) (80/80/90/90)  Diagnostic/therapeutic right IA hip inj. x2 (01/28/2022) (100/100/85/85)  Diagnostic/therapeutic left IA hip inj. x1 (01/28/2022) (100/100/85/85)  Diagnostic/therapeutic right trochanteric bursa inj. x1 (09/15/2021) (85/85/85)  Diagnostic/therapeutic left trochanteric bursa inj. x1 (01/28/2022) (100/100/85/85)  Therapeutic left lumbar facet RFA x1 (07/21/2021) (100/100/100 x1 week/80) (Lasted 9 months)  Therapeutic right lumbar facet RFA x1 (07/07/2021) (100/100/80/80) (Lasted 9 months)  Palliative/Therapeutic left L4-5 LESI x1 (11/10/2017) (100/80/0/0-25)  Diagnostic/therapeutic right L4 TFESI x1 (11/19/2021) (90/80/0/0) (fell at [employer]) Palliative left lumbar facet (L2-S1) MBB x20 (12/07/2022) (90/80/30/30)  Palliative right lumbar facet (L2-S1) MBB x27 (12/07/2022) (90/80/30/30)  Palliative right SI joint block x9 (01/28/2022) (100/100/80/80)  Diagnostic left SI joint block x2 (01/28/2022) (100/100/80/80)  Palliative/Therapeutic (Midline) caudal ESI x3 (06/08/2018) (100/100/100 x 2-3 days/0)    Therapeutic  Palliative (PRN) options:   NO PRN procedures without F2F exam by Dr. Laban Emperor due to multifactorial etiology of chronic pain. No more procedures until after 08/14/2022.    Pharmacotherapy  Nonopioids transferred 03/27/2020: Calcium         Recent Visits Date Type Provider Dept  03/15/23 Procedure visit Delano Metz, MD [name]  03/03/23 Office Visit Delano Metz, MD [name]  Showing recent visits within past 90 days and meeting all other requirements Today's Visits Date Type Provider Dept  04/07/23 Office  Visit Delano Metz, MD [name]  Showing today's visits and meeting all other requirements Future Appointments No visits were found meeting these conditions. Showing future appointments within next 90 days and meeting all other requirements  I discussed the assessment and treatment plan with the patient. The patient was provided an opportunity to ask questions and all were answered. The patient agreed with the plan and demonstrated an understanding of the instructions.  Patient advised to call back or seek an in-person evaluation if the symptoms or condition worsens.  Duration of encounter: 30 minutes.  Total time on encounter, as per AMA guidelines included both the face-to-face and non-face-to-face time personally spent by the physician and/or other qualified health care professional(s) on the day of the encounter (includes time in activities that require the physician or other qualified health care professional and does not include time in activities normally performed by clinical staff). Physician's time may include the following activities when performed: Preparing to see the patient (e.g., pre-charting review of records, searching for previously ordered imaging, lab work, and nerve conduction tests) Review of prior analgesic pharmacotherapies. Reviewing PMP Interpreting ordered tests (e.g., lab work, imaging, nerve conduction tests) Performing post-procedure evaluations, including interpretation of diagnostic procedures Obtaining and/or reviewing separately obtained history Performing a medically appropriate examination and/or evaluation Counseling and educating the patient/family/caregiver Ordering medications, tests, or procedures Referring and communicating with other health care professionals (when not separately reported) Documenting clinical information in the electronic or other health record Independently interpreting results (not separately reported) and  communicating results to the patient/ family/caregiver Care coordination (not separately reported)  Note by: Oswaldo Done, MD Date: 04/07/2023; Time: 1:41 PM

## 2023-04-08 ENCOUNTER — Ambulatory Visit: Payer: PPO | Admitting: Oncology

## 2023-04-13 ENCOUNTER — Encounter: Payer: Self-pay | Admitting: Oncology

## 2023-04-13 ENCOUNTER — Inpatient Hospital Stay: Payer: PPO | Attending: Oncology | Admitting: Oncology

## 2023-04-13 ENCOUNTER — Telehealth: Payer: Self-pay

## 2023-04-13 VITALS — BP 115/57 | HR 88 | Temp 98.8°F | Resp 18 | Wt 203.1 lb

## 2023-04-13 DIAGNOSIS — Z923 Personal history of irradiation: Secondary | ICD-10-CM | POA: Insufficient documentation

## 2023-04-13 DIAGNOSIS — Z85118 Personal history of other malignant neoplasm of bronchus and lung: Secondary | ICD-10-CM

## 2023-04-13 DIAGNOSIS — R590 Localized enlarged lymph nodes: Secondary | ICD-10-CM

## 2023-04-13 DIAGNOSIS — C3411 Malignant neoplasm of upper lobe, right bronchus or lung: Secondary | ICD-10-CM | POA: Insufficient documentation

## 2023-04-13 DIAGNOSIS — Z79899 Other long term (current) drug therapy: Secondary | ICD-10-CM | POA: Diagnosis not present

## 2023-04-13 NOTE — Telephone Encounter (Signed)
Per secure chat from Dr. Jayme Cloud, She would likely need bronchoscopy with endobronchial ultrasound. We can certainly get her back on the schedule to see me and get this coordinated.   Lm x1 for the patient.

## 2023-04-13 NOTE — Telephone Encounter (Signed)
I spoke with the patient and scheduled her an appt for tomorrow at 2:00pm.  Nothing further needed.

## 2023-04-14 ENCOUNTER — Encounter: Payer: Self-pay | Admitting: Pulmonary Disease

## 2023-04-14 ENCOUNTER — Ambulatory Visit: Payer: PPO | Admitting: Pulmonary Disease

## 2023-04-14 ENCOUNTER — Telehealth: Payer: Self-pay

## 2023-04-14 VITALS — BP 110/70 | HR 84 | Temp 97.3°F | Ht 63.0 in | Wt 205.6 lb

## 2023-04-14 DIAGNOSIS — Z7901 Long term (current) use of anticoagulants: Secondary | ICD-10-CM | POA: Diagnosis not present

## 2023-04-14 DIAGNOSIS — Z86711 Personal history of pulmonary embolism: Secondary | ICD-10-CM

## 2023-04-14 DIAGNOSIS — R590 Localized enlarged lymph nodes: Secondary | ICD-10-CM | POA: Diagnosis not present

## 2023-04-14 DIAGNOSIS — Z85118 Personal history of other malignant neoplasm of bronchus and lung: Secondary | ICD-10-CM | POA: Diagnosis not present

## 2023-04-14 NOTE — Progress Notes (Signed)
Subjective:    Patient ID: Kara [name], female    DOB: Jan 15, 1951, 72 y.o.   MRN: [id number]  Patient Care Team: Barbette Reichmann, MD as PCP - General (Internal Medicine) Glory Buff, RN as Oncology Nurse Navigator Creig Hines, MD as Consulting Physician (Oncology)  Chief Complaint  Patient presents with   Consult    Nodule. DOE. No wheezing or cough.    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  Patient has been evaluated by me for robotic assisted bronchoscopy for her cancer diagnosis in October 2022.  Had been followed by oncology since then.    HPI Discussed the use of AI scribe software for clinical note transcription with the patient, who gave verbal consent to proceed.  History of Present Illness   Kara [name], a patient with a history of non-small cell lung cancer, presents for evaluation of mediastinal adenopathy noted on a recent PET CT scan. The patient reports feeling well until the discovery of the adenopathy. She mentions a recent hospital admission due to vomiting, initially suspected to be related to COVID-19. However, a CT scan revealed a bowel obstruction, which required a three-day hospital stay and the placement of a nasogastric tube. The patient wonders if this recent hospitalization and procedure could have contributed to the current findings on the PET CT scan.  Additionally, the patient experienced a complication during her hospital stay. An IV line infiltrated, causing significant arm swelling, which took a considerable amount of time to resolve. The patient reports no other health issues since this incident. She is currently on warfarin for history of PE and manages her own dosing. She also receives regular  back injections for an spondylosis. 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     Review of Systems A 10 point review of systems was performed and it is as noted above otherwise negative.   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, 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   CHF (congestive heart failure) (HCC)    followed colon resection, "wouldn't let me get up"   Diabetes (HCC)    type 2   Diverticulitis 2018   History of being hospitalized    1 month ago for 3 days, vomiting, and kink in upper intestine   History of hiatal hernia    Hypertension    Osteoporosis    PE (pulmonary thromboembolism) (HCC)    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HCC)    Vertigo     Past Surgical History:  Procedure Laterality Date   APPENDECTOMY N/A 02/24/2017   Procedure: Incidental  APPENDECTOMY;  Surgeon: Ricarda Frame, MD;  Location: [name];  Service: General;  Laterality: N/A;   CARPAL TUNNEL RELEASE Bilateral    CATARACT EXTRACTION W/PHACO Left 11/21/2018   Procedure: CATARACT EXTRACTION PHACO AND INTRAOCULAR LENS PLACEMENT (IOC) LEFT DIABETES;  Surgeon: Galen Manila, MD;  Location: [facility];  Service: Ophthalmology;  Laterality: Left;  latex sensitivity Diabetic - oral meds   CATARACT EXTRACTION W/PHACO Right 12/12/2018   Procedure: CATARACT EXTRACTION PHACO AND INTRAOCULAR LENS PLACEMENT (IOC) RIGHT DIABETES;  Surgeon: Galen Manila, MD;  Location: [facility];  Service: Ophthalmology;  Laterality: Right;  Diabetes-oral med Latex sensitiviy   COLON RESECTION SIGMOID N/A 11/02/2016   Procedure: COLON RESECTION SIGMOID;  Surgeon: Ricarda Frame, MD;  Location: [name];  Service: General;  Laterality: N/A;   COLON SURGERY  11/02/2016   COLONOSCOPY WITH PROPOFOL N/A 02/03/2017   Procedure: COLONOSCOPY  WITH PROPOFOL;  Surgeon: Toney Reil, MD;  Location: [facility];  Service: Gastroenterology;  Laterality: N/A;   COLOSTOMY N/A 11/02/2016   Procedure: COLOSTOMY;  Surgeon: Ricarda Frame, MD;  Location: [name];  Service: General;  Laterality: N/A;   COLOSTOMY REVERSAL N/A 02/24/2017   Procedure: COLOSTOMY REVERSAL, ostomy takedown, spleenic flexure mobilization, excision rectal stump/distal sigmoid, anastomosis with suture reinforcement;  Surgeon: Ricarda Frame, MD;  Location: [name];  Service: General;  Laterality: N/A;   ILEO LOOP DIVERSION N/A 02/24/2017   Procedure: ILEO LOOP COLOSTOMY;  Surgeon: Ricarda Frame, MD;  Location: [name];  Service: General;  Laterality: N/A;   ILEOSTOMY CLOSURE N/A 06/01/2017   Procedure: LOOP ILEOSTOMY TAKEDOWN;  Surgeon: Ricarda Frame, MD;  Location: [name];  Service: General;  Laterality: N/A;   IVC FILTER INSERTION Right 10/2016   IVC FILTER REMOVAL N/A 08/09/2017   Procedure: IVC FILTER REMOVAL;  Surgeon: Renford Dills, MD;  Location: [name];  Service: Cardiovascular;  Laterality: N/A;   KNEE SURGERY     torn menicus   LAPAROSCOPY N/A 02/24/2017   Procedure: LAPAROSCOPY DIAGNOSTIC;  Surgeon: Ricarda Frame, MD;  Location: [name];  Service: General;  Laterality: N/A;   LYSIS OF ADHESION N/A 02/24/2017   Procedure: LYSIS OF ADHESION;  Surgeon: Ricarda Frame, MD;  Location: [name];  Service: General;  Laterality: N/A;   PULMONARY VENOGRAPHY N/A 11/19/2016   Procedure: Pulmonary Venography; IVC filter placement; possible pulmonary thrombectomy;  Surgeon: Renford Dills, MD;  Location: [name];  Service: Cardiovascular;  Laterality: N/A;   SACROPLASTY N/A 11/08/2017   Procedure: SACROPLASTY S2;  Surgeon: Kennedy Bucker, MD;  Location: [name];  Service: Orthopedics;  Laterality: N/A;   SIGMOIDOSCOPY N/A 11/13/2016   Procedure: endoscopic  flexible SIGMOIDOSCOPY;  Surgeon: Lattie Haw, MD;   Location: [name];  Service: General;  Laterality: N/A;   SIGMOIDOSCOPY N/A 05/19/2017   Procedure: Arnell Sieving;  Surgeon: Ricarda Frame, MD;  Location: [name];  Service: General;  Laterality: N/A;   VIDEO BRONCHOSCOPY WITH ENDOBRONCHIAL NAVIGATION N/A 02/23/2021   Procedure: ROBOTIC VIDEO BRONCHOSCOPY WITH ENDOBRONCHIAL NAVIGATION;  Surgeon: [name], MD;  Location: [name];  Service: Pulmonary;  Laterality: N/A;    Patient Active Problem List   Diagnosis Date Noted   Small bowel obstruction (HCC) 03/21/2023   Abdominal pain 03/20/2023   Chest pain in adult 03/20/2023   Piriformis muscle pain (Right) 11/01/2022   Mourning 09/29/2022   Lumbar facet joint pain 09/02/2022   Greater trochanteric bursitis (Left) 01/28/2022   Other bursitis of hip, not elsewhere classified (gluteus medius bursa) (Left) 01/28/2022   Chronic low back pain (Right) w/o sciatica 01/07/2022   SBO (small bowel obstruction) (HCC) 11/21/2021   History of recurrent deep vein thrombosis (DVT) 11/21/2021   Subtherapeutic international normalized ratio (INR) 11/21/2021   Statin declined 11/02/2021   Osteoarthritis of hips (Bilateral) 09/15/2021   Greater trochanteric bursitis (Right) 09/15/2021   Chronic hip pain (Right) 09/15/2021   Chronic hip pain (Bilateral) (R>L) 09/01/2021   Right upper lobe pulmonary nodule 11/21/2020  Chronic use of opiate for therapeutic purpose 10/22/2020   Gastroenteritis due to COVID-19 virus 08/14/2020   Metabolic syndrome 08/14/2020   Generalized weakness 08/14/2020   Truncal obesity 08/04/2020   Latex precautions, history of latex allergy 06/24/2020   Hyperglycemia 06/24/2020   Uncomplicated opioid dependence (HCC) 06/15/2020   Spasm of muscle of lower back 01/29/2020   Abnormal MRI, lumbar spine (08/09/2019) 08/14/2019   Spondylolisthesis, lumbosacral region 07/26/2019   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   Chronic sacroiliac joint pain (Left)  02/28/2019   Preoperative testing 10/23/2018   Small bowel obstruction due to adhesions (HCC) 08/24/2018   Coccygodynia 05/09/2018   Acute deep vein thrombosis (DVT) of distal end of right lower extremity (HCC) 05/08/2018   DDD (degenerative disc disease), lumbosacral 04/11/2018   Chronic anticoagulation (COUMADIN) 04/11/2018   Severe obesity (BMI 35.0-35.9 with comorbidity) (HCC) 03/15/2018   Secondary osteoarthritis of multiple sites 03/15/2018   Sacral insufficiency fracture 03/15/2018   Chronic low back pain (1ry area of Pain) (Bilateral) w/o sciatica 12/14/2017   Spondylosis without myelopathy or radiculopathy, lumbosacral region 12/14/2017   Other specified dorsopathies, sacral and sacrococcygeal region 12/14/2017   History of allergy to latex 11/09/2017   Hyponatremia 11/02/2017   Hypocalcemia 11/02/2017   Grade 1-2 Anterolisthesis of L4/L5 & L5/S1 (stable as of 11/05/2021) 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) 11/02/2017   Lumbar pars defect (L3 and L4) (Bilateral) 11/02/2017   Diabetic peripheral neuropathy (HCC) 11/02/2017   Chronic lower extremity pain (2ry area of Pain) (Bilateral) 11/02/2017   Chronic radicular pain of lower extremity 11/02/2017   Osteoarthritis of hip (Right) 11/02/2017   Chronic low back pain (Bilateral) w/ sciatica (Bilateral) 10/13/2017   Chronic sacroiliac joint pain (Right) 10/13/2017   Chronic pain syndrome 10/13/2017   Long term current use of opiate analgesic 10/13/2017   Pharmacologic therapy 10/13/2017   Disorder of skeletal system 10/13/2017   Problems influencing health status 10/13/2017   Diverticulosis of colon 06/20/2017   History of  pulmonary embolism 06/20/2017   S/P IVC filter 06/20/2017   H/O ileostomy 06/01/2017   Positive result for methicillin resistant Staphylococcus aureus (MRSA) screening 05/11/2017   Ileostomy in place [facility]) 04/08/2017   History of colostomy reversal 02/24/2017   Other pulmonary embolism without acute cor pulmonale (HCC) 11/23/2016   Chronic diastolic (congestive) heart failure (HCC) 11/22/2016   Oxygen dependent 11/22/2016   Acute on chronic systolic (congestive) heart failure (HCC) 11/18/2016   HCAP (healthcare-associated pneumonia) 11/18/2016   Encounter for removal of staples    Edema of both legs 11/15/2016   Diverticulitis 10/03/2016   Diverticulitis large intestine 09/29/2016   Benign essential hypertension 08/26/2016   DDD (degenerative disc disease), lumbar 07/14/2016   Acute non-recurrent maxillary sinusitis 05/21/2016   Chronic hip pain (Left) 02/23/2016   Cough 07/30/2015   Nocturnal leg cramps 02/20/2015   Type 2 diabetes mellitus with obesity (HCC) 11/12/2014   Type 2 diabetes mellitus with hyperglycemia, without long-term current use of insulin (HCC) 11/12/2014   Essential hypertension 11/12/2014    Family History  Problem Relation Age of Onset   Diabetes Mother    Heart disease Mother    Cancer Mother    Breast cancer Mother        >  50   Heart disease Father    Diabetes Sister    Heart disease Sister     Social History   Tobacco Use   Smoking status: Never    Passive exposure: Current (husband smokes)   Smokeless tobacco: Never  Substance Use Topics   Alcohol use: No    Alcohol/week: 0.0 standard drinks of alcohol    Allergies  Allergen Reactions   Adhesive [Tape] Other (See Comments)    Pt reports, when removing tape from skin most tapes pull her skin off. Ok to use paper tape   Other Hives    Chlorhexadine wipes/CHG wipes,    Latex Rash    Exam gloves/ does not react around elastic and lips don't swell when blowing balloons   Morphine And  Codeine Nausea And Vomiting    Current Meds  Medication Sig   candesartan (ATACAND) 16 MG tablet Take 16 mg by mouth in the morning.   cholecalciferol (VITAMIN D) 25 MCG (1000 UNIT) tablet Take 1,000 Units by mouth in the morning.   Cyanocobalamin (VITAMIN B-12) 5000 MCG TBDP Take 5,000 mcg by mouth in the morning.   cyclobenzaprine (FLEXERIL) 10 MG tablet Take 10 mg by mouth at bedtime.   Dulaglutide 1.5 MG/0.5ML SOPN Inject 1.5 mg into the skin every Saturday.   gabapentin (NEURONTIN) 100 MG capsule Take 200 mg by mouth with breakfast, with lunch, and with evening meal.   gabapentin (NEURONTIN) 300 MG capsule Take 300 mg by mouth at bedtime.   glipiZIDE (GLUCOTROL) 10 MG tablet Take 10 mg by mouth daily before breakfast.   HYDROcodone-acetaminophen (NORCO/VICODIN) 5-325 MG tablet Take 1 tablet by mouth every 6 (six) hours as needed for moderate pain.   Magnesium Oxide -Mg Supplement 500 MG TABS Take by mouth.   meclizine (ANTIVERT) 25 MG tablet Take 25 mg by mouth 3 (three) times daily as needed for dizziness.   oxybutynin (DITROPAN) 5 MG tablet Take 1 tablet by mouth 2 (two) times daily.   rOPINIRole (REQUIP) 0.5 MG tablet Take 0.5 mg by mouth at bedtime.   warfarin (COUMADIN) 6 MG tablet Take 6 mg by mouth daily.    Immunization History  Administered Date(s) Administered   Influenza Inj Mdck Quad Pf 02/26/2019, 02/13/2021, 03/17/2022   Influenza, High Dose Seasonal PF 03/01/2017, 06/10/2018   Influenza, Mdck, Trivalent,PF 6+ MOS(egg free) 03/31/2023   Influenza,inj,Quad PF,6+ Mos 03/04/2020   Influenza-Unspecified 06/07/2018, 02/26/2019, 02/13/2021   PFIZER Comirnaty(Gray Top)Covid-19 Tri-Sucrose Vaccine 04/06/2020   PFIZER(Purple Top)SARS-COV-2 Vaccination 08/20/2019, 09/12/2019   Pneumococcal Polysaccharide-23 09/30/2016   Zoster Recombinant(Shingrix) 02/14/2018        Objective:     BP 110/70 (BP Location: Right Arm, Cuff Size: Large)   Pulse 84   Temp (!) 97.3 F  (36.3 C)   Ht 5\' 3"  (1.6 m)   Wt 205 lb 9.6 oz (93.3 kg)   SpO2 94%   BMI 36.42 kg/m   SpO2: 94 % O2 Device: None (Room air)  GENERAL: Obese woman, no acute distress, fully ambulatory.  No conversational dyspnea. HEAD: Normocephalic, atraumatic.  EYES: Pupils equal, round, reactive to light.  No scleral icterus.  MOUTH: Poor dentition, some missing teeth, oral mucosa moist. NECK: Supple. No thyromegaly. Trachea midline. No JVD.  No adenopathy. PULMONARY: Good air entry bilaterally.  Symmetrical air entry.  No adventitious sounds. CARDIOVASCULAR: S1 and S2. Regular rate and rhythm.  No rubs, murmurs or gallops heard. ABDOMEN: Obese, otherwise benign. MUSCULOSKELETAL: No joint deformity, no clubbing, no  edema.  NEUROLOGIC: No focal deficit, gait not tested, speech is fluent. SKIN: Intact,warm,dry.  Multiple ecchymotic areas on the upper extremities. PSYCH: Mood and behavior normal.   Representative images from PET/CT performed 06 April 2023 showing the adenopathy in question:     Assessment & Plan:     ICD-10-CM   1. Mediastinal adenopathy  R59.0     2. History of adenocarcinoma of lung  Z85.118     3. Personal history of pulmonary embolism  Z86.711     4. Chronic anticoagulation  Z79.01      Discussion:    Mediastinal Adenopathy Mediastinal adenopathy noted on PET CT with a history of non-small cell lung cancer. Prominent lymph nodes in the mediastinum, particularly on the right side before the tracheal bifurcation. Differential includes inflammation versus malignancy. Recent nasogastric tube placement and hospitalization for bowel obstruction may contribute to inflammation. Discussed EBUS with biopsy, including procedure details, outpatient nature, 20-30 minute duration, and need to stop warfarin 3-5 days prior. Emphasized importance of biopsy for accurate diagnosis. - Schedule EBUS with biopsy for May 09, 2023 - Instruct to stop warfarin 5 days prior to  procedure - Follow up in 4-6 weeks post-procedure  Non-Small Cell Lung Cancer Treated with radiation. Current PET CT shows activity in previously treated areas, which is expected. Emphasized monitoring for recurrence or new symptoms and evaluating EBUS biopsy results to rule out malignancy. - Monitor for recurrence or new symptoms - Evaluate EBUS biopsy results to rule out malignancy  Bowel Obstruction Recent hospitalization for bowel obstruction treated with nasogastric tube placement. No current symptoms. Emphasized monitoring for recurrence and seeking immediate care if symptoms reoccur. - Monitor for recurrence of symptoms - Advise to seek immediate care if symptoms reoccur  General Health Maintenance Non-smoker, manages own warfarin therapy.  Primary care manages refills - Continue current health maintenance practices  Follow-up - Schedule follow-up appointment 4-6 weeks post-procedure.        Gailen Shelter, MD Advanced [facility]    *This note was dictated using voice recognition software/Dragon.  Despite best efforts to proofread, errors can occur which can change the meaning. Any transcriptional errors that result from this process are unintentional and may not be fully corrected at the time of dictation.

## 2023-04-14 NOTE — Telephone Encounter (Signed)
Flexible Bronchoscopy with EBUS 05/09/2023 at 12:30pm Lung Nodule [id number], [id number], [id number]  Synetta Fail please see Bronch info.

## 2023-04-14 NOTE — H&P (View-Only) (Signed)
 Subjective:    Patient ID: Kara [name], female    DOB: Jan 15, 1951, 72 y.o.   MRN: [id number]  Patient Care Team: Barbette Reichmann, MD as PCP - General (Internal Medicine) Glory Buff, RN as Oncology Nurse Navigator Creig Hines, MD as Consulting Physician (Oncology)  Chief Complaint  Patient presents with   Consult    Nodule. DOE. No wheezing or cough.    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  Patient has been evaluated by me for robotic assisted bronchoscopy for her cancer diagnosis in October 2022.  Had been followed by oncology since then.    HPI Discussed the use of AI scribe software for clinical note transcription with the patient, who gave verbal consent to proceed.  History of Present Illness   Kara [name], a patient with a history of non-small cell lung cancer, presents for evaluation of mediastinal adenopathy noted on a recent PET CT scan. The patient reports feeling well until the discovery of the adenopathy. She mentions a recent hospital admission due to vomiting, initially suspected to be related to COVID-19. However, a CT scan revealed a bowel obstruction, which required a three-day hospital stay and the placement of a nasogastric tube. The patient wonders if this recent hospitalization and procedure could have contributed to the current findings on the PET CT scan.  Additionally, the patient experienced a complication during her hospital stay. An IV line infiltrated, causing significant arm swelling, which took a considerable amount of time to resolve. The patient reports no other health issues since this incident. She is currently on warfarin for history of PE and manages her own dosing. She also receives regular  back injections for an spondylosis. 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     Review of Systems A 10 point review of systems was performed and it is as noted above otherwise negative.   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, 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   CHF (congestive heart failure) (HCC)    followed colon resection, "wouldn't let me get up"   Diabetes (HCC)    type 2   Diverticulitis 2018   History of being hospitalized    1 month ago for 3 days, vomiting, and kink in upper intestine   History of hiatal hernia    Hypertension    Osteoporosis    PE (pulmonary thromboembolism) (HCC)    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HCC)    Vertigo     Past Surgical History:  Procedure Laterality Date   APPENDECTOMY N/A 02/24/2017   Procedure: Incidental  APPENDECTOMY;  Surgeon: Ricarda Frame, MD;  Location: [name];  Service: General;  Laterality: N/A;   CARPAL TUNNEL RELEASE Bilateral    CATARACT EXTRACTION W/PHACO Left 11/21/2018   Procedure: CATARACT EXTRACTION PHACO AND INTRAOCULAR LENS PLACEMENT (IOC) LEFT DIABETES;  Surgeon: Galen Manila, MD;  Location: [facility];  Service: Ophthalmology;  Laterality: Left;  latex sensitivity Diabetic - oral meds   CATARACT EXTRACTION W/PHACO Right 12/12/2018   Procedure: CATARACT EXTRACTION PHACO AND INTRAOCULAR LENS PLACEMENT (IOC) RIGHT DIABETES;  Surgeon: Galen Manila, MD;  Location: [facility];  Service: Ophthalmology;  Laterality: Right;  Diabetes-oral med Latex sensitiviy   COLON RESECTION SIGMOID N/A 11/02/2016   Procedure: COLON RESECTION SIGMOID;  Surgeon: Ricarda Frame, MD;  Location: [name];  Service: General;  Laterality: N/A;   COLON SURGERY  11/02/2016   COLONOSCOPY WITH PROPOFOL N/A 02/03/2017   Procedure: COLONOSCOPY  WITH PROPOFOL;  Surgeon: Toney Reil, MD;  Location: [facility];  Service: Gastroenterology;  Laterality: N/A;   COLOSTOMY N/A 11/02/2016   Procedure: COLOSTOMY;  Surgeon: Ricarda Frame, MD;  Location: [name];  Service: General;  Laterality: N/A;   COLOSTOMY REVERSAL N/A 02/24/2017   Procedure: COLOSTOMY REVERSAL, ostomy takedown, spleenic flexure mobilization, excision rectal stump/distal sigmoid, anastomosis with suture reinforcement;  Surgeon: Ricarda Frame, MD;  Location: [name];  Service: General;  Laterality: N/A;   ILEO LOOP DIVERSION N/A 02/24/2017   Procedure: ILEO LOOP COLOSTOMY;  Surgeon: Ricarda Frame, MD;  Location: [name];  Service: General;  Laterality: N/A;   ILEOSTOMY CLOSURE N/A 06/01/2017   Procedure: LOOP ILEOSTOMY TAKEDOWN;  Surgeon: Ricarda Frame, MD;  Location: [name];  Service: General;  Laterality: N/A;   IVC FILTER INSERTION Right 10/2016   IVC FILTER REMOVAL N/A 08/09/2017   Procedure: IVC FILTER REMOVAL;  Surgeon: Renford Dills, MD;  Location: [name];  Service: Cardiovascular;  Laterality: N/A;   KNEE SURGERY     torn menicus   LAPAROSCOPY N/A 02/24/2017   Procedure: LAPAROSCOPY DIAGNOSTIC;  Surgeon: Ricarda Frame, MD;  Location: [name];  Service: General;  Laterality: N/A;   LYSIS OF ADHESION N/A 02/24/2017   Procedure: LYSIS OF ADHESION;  Surgeon: Ricarda Frame, MD;  Location: [name];  Service: General;  Laterality: N/A;   PULMONARY VENOGRAPHY N/A 11/19/2016   Procedure: Pulmonary Venography; IVC filter placement; possible pulmonary thrombectomy;  Surgeon: Renford Dills, MD;  Location: [name];  Service: Cardiovascular;  Laterality: N/A;   SACROPLASTY N/A 11/08/2017   Procedure: SACROPLASTY S2;  Surgeon: Kennedy Bucker, MD;  Location: [name];  Service: Orthopedics;  Laterality: N/A;   SIGMOIDOSCOPY N/A 11/13/2016   Procedure: endoscopic  flexible SIGMOIDOSCOPY;  Surgeon: Lattie Haw, MD;   Location: [name];  Service: General;  Laterality: N/A;   SIGMOIDOSCOPY N/A 05/19/2017   Procedure: Arnell Sieving;  Surgeon: Ricarda Frame, MD;  Location: [name];  Service: General;  Laterality: N/A;   VIDEO BRONCHOSCOPY WITH ENDOBRONCHIAL NAVIGATION N/A 02/23/2021   Procedure: ROBOTIC VIDEO BRONCHOSCOPY WITH ENDOBRONCHIAL NAVIGATION;  Surgeon: [name], MD;  Location: [name];  Service: Pulmonary;  Laterality: N/A;    Patient Active Problem List   Diagnosis Date Noted   Small bowel obstruction (HCC) 03/21/2023   Abdominal pain 03/20/2023   Chest pain in adult 03/20/2023   Piriformis muscle pain (Right) 11/01/2022   Mourning 09/29/2022   Lumbar facet joint pain 09/02/2022   Greater trochanteric bursitis (Left) 01/28/2022   Other bursitis of hip, not elsewhere classified (gluteus medius bursa) (Left) 01/28/2022   Chronic low back pain (Right) w/o sciatica 01/07/2022   SBO (small bowel obstruction) (HCC) 11/21/2021   History of recurrent deep vein thrombosis (DVT) 11/21/2021   Subtherapeutic international normalized ratio (INR) 11/21/2021   Statin declined 11/02/2021   Osteoarthritis of hips (Bilateral) 09/15/2021   Greater trochanteric bursitis (Right) 09/15/2021   Chronic hip pain (Right) 09/15/2021   Chronic hip pain (Bilateral) (R>L) 09/01/2021   Right upper lobe pulmonary nodule 11/21/2020  Chronic use of opiate for therapeutic purpose 10/22/2020   Gastroenteritis due to COVID-19 virus 08/14/2020   Metabolic syndrome 08/14/2020   Generalized weakness 08/14/2020   Truncal obesity 08/04/2020   Latex precautions, history of latex allergy 06/24/2020   Hyperglycemia 06/24/2020   Uncomplicated opioid dependence (HCC) 06/15/2020   Spasm of muscle of lower back 01/29/2020   Abnormal MRI, lumbar spine (08/09/2019) 08/14/2019   Spondylolisthesis, lumbosacral region 07/26/2019   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   Chronic sacroiliac joint pain (Left)  02/28/2019   Preoperative testing 10/23/2018   Small bowel obstruction due to adhesions (HCC) 08/24/2018   Coccygodynia 05/09/2018   Acute deep vein thrombosis (DVT) of distal end of right lower extremity (HCC) 05/08/2018   DDD (degenerative disc disease), lumbosacral 04/11/2018   Chronic anticoagulation (COUMADIN) 04/11/2018   Severe obesity (BMI 35.0-35.9 with comorbidity) (HCC) 03/15/2018   Secondary osteoarthritis of multiple sites 03/15/2018   Sacral insufficiency fracture 03/15/2018   Chronic low back pain (1ry area of Pain) (Bilateral) w/o sciatica 12/14/2017   Spondylosis without myelopathy or radiculopathy, lumbosacral region 12/14/2017   Other specified dorsopathies, sacral and sacrococcygeal region 12/14/2017   History of allergy to latex 11/09/2017   Hyponatremia 11/02/2017   Hypocalcemia 11/02/2017   Grade 1-2 Anterolisthesis of L4/L5 & L5/S1 (stable as of 11/05/2021) 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) 11/02/2017   Lumbar pars defect (L3 and L4) (Bilateral) 11/02/2017   Diabetic peripheral neuropathy (HCC) 11/02/2017   Chronic lower extremity pain (2ry area of Pain) (Bilateral) 11/02/2017   Chronic radicular pain of lower extremity 11/02/2017   Osteoarthritis of hip (Right) 11/02/2017   Chronic low back pain (Bilateral) w/ sciatica (Bilateral) 10/13/2017   Chronic sacroiliac joint pain (Right) 10/13/2017   Chronic pain syndrome 10/13/2017   Long term current use of opiate analgesic 10/13/2017   Pharmacologic therapy 10/13/2017   Disorder of skeletal system 10/13/2017   Problems influencing health status 10/13/2017   Diverticulosis of colon 06/20/2017   History of  pulmonary embolism 06/20/2017   S/P IVC filter 06/20/2017   H/O ileostomy 06/01/2017   Positive result for methicillin resistant Staphylococcus aureus (MRSA) screening 05/11/2017   Ileostomy in place [facility]) 04/08/2017   History of colostomy reversal 02/24/2017   Other pulmonary embolism without acute cor pulmonale (HCC) 11/23/2016   Chronic diastolic (congestive) heart failure (HCC) 11/22/2016   Oxygen dependent 11/22/2016   Acute on chronic systolic (congestive) heart failure (HCC) 11/18/2016   HCAP (healthcare-associated pneumonia) 11/18/2016   Encounter for removal of staples    Edema of both legs 11/15/2016   Diverticulitis 10/03/2016   Diverticulitis large intestine 09/29/2016   Benign essential hypertension 08/26/2016   DDD (degenerative disc disease), lumbar 07/14/2016   Acute non-recurrent maxillary sinusitis 05/21/2016   Chronic hip pain (Left) 02/23/2016   Cough 07/30/2015   Nocturnal leg cramps 02/20/2015   Type 2 diabetes mellitus with obesity (HCC) 11/12/2014   Type 2 diabetes mellitus with hyperglycemia, without long-term current use of insulin (HCC) 11/12/2014   Essential hypertension 11/12/2014    Family History  Problem Relation Age of Onset   Diabetes Mother    Heart disease Mother    Cancer Mother    Breast cancer Mother        >  50   Heart disease Father    Diabetes Sister    Heart disease Sister     Social History   Tobacco Use   Smoking status: Never    Passive exposure: Current (husband smokes)   Smokeless tobacco: Never  Substance Use Topics   Alcohol use: No    Alcohol/week: 0.0 standard drinks of alcohol    Allergies  Allergen Reactions   Adhesive [Tape] Other (See Comments)    Pt reports, when removing tape from skin most tapes pull her skin off. Ok to use paper tape   Other Hives    Chlorhexadine wipes/CHG wipes,    Latex Rash    Exam gloves/ does not react around elastic and lips don't swell when blowing balloons   Morphine And  Codeine Nausea And Vomiting    Current Meds  Medication Sig   candesartan (ATACAND) 16 MG tablet Take 16 mg by mouth in the morning.   cholecalciferol (VITAMIN D) 25 MCG (1000 UNIT) tablet Take 1,000 Units by mouth in the morning.   Cyanocobalamin (VITAMIN B-12) 5000 MCG TBDP Take 5,000 mcg by mouth in the morning.   cyclobenzaprine (FLEXERIL) 10 MG tablet Take 10 mg by mouth at bedtime.   Dulaglutide 1.5 MG/0.5ML SOPN Inject 1.5 mg into the skin every Saturday.   gabapentin (NEURONTIN) 100 MG capsule Take 200 mg by mouth with breakfast, with lunch, and with evening meal.   gabapentin (NEURONTIN) 300 MG capsule Take 300 mg by mouth at bedtime.   glipiZIDE (GLUCOTROL) 10 MG tablet Take 10 mg by mouth daily before breakfast.   HYDROcodone-acetaminophen (NORCO/VICODIN) 5-325 MG tablet Take 1 tablet by mouth every 6 (six) hours as needed for moderate pain.   Magnesium Oxide -Mg Supplement 500 MG TABS Take by mouth.   meclizine (ANTIVERT) 25 MG tablet Take 25 mg by mouth 3 (three) times daily as needed for dizziness.   oxybutynin (DITROPAN) 5 MG tablet Take 1 tablet by mouth 2 (two) times daily.   rOPINIRole (REQUIP) 0.5 MG tablet Take 0.5 mg by mouth at bedtime.   warfarin (COUMADIN) 6 MG tablet Take 6 mg by mouth daily.    Immunization History  Administered Date(s) Administered   Influenza Inj Mdck Quad Pf 02/26/2019, 02/13/2021, 03/17/2022   Influenza, High Dose Seasonal PF 03/01/2017, 06/10/2018   Influenza, Mdck, Trivalent,PF 6+ MOS(egg free) 03/31/2023   Influenza,inj,Quad PF,6+ Mos 03/04/2020   Influenza-Unspecified 06/07/2018, 02/26/2019, 02/13/2021   PFIZER Comirnaty(Gray Top)Covid-19 Tri-Sucrose Vaccine 04/06/2020   PFIZER(Purple Top)SARS-COV-2 Vaccination 08/20/2019, 09/12/2019   Pneumococcal Polysaccharide-23 09/30/2016   Zoster Recombinant(Shingrix) 02/14/2018        Objective:     BP 110/70 (BP Location: Right Arm, Cuff Size: Large)   Pulse 84   Temp (!) 97.3 F  (36.3 C)   Ht 5\' 3"  (1.6 m)   Wt 205 lb 9.6 oz (93.3 kg)   SpO2 94%   BMI 36.42 kg/m   SpO2: 94 % O2 Device: None (Room air)  GENERAL: Obese woman, no acute distress, fully ambulatory.  No conversational dyspnea. HEAD: Normocephalic, atraumatic.  EYES: Pupils equal, round, reactive to light.  No scleral icterus.  MOUTH: Poor dentition, some missing teeth, oral mucosa moist. NECK: Supple. No thyromegaly. Trachea midline. No JVD.  No adenopathy. PULMONARY: Good air entry bilaterally.  Symmetrical air entry.  No adventitious sounds. CARDIOVASCULAR: S1 and S2. Regular rate and rhythm.  No rubs, murmurs or gallops heard. ABDOMEN: Obese, otherwise benign. MUSCULOSKELETAL: No joint deformity, no clubbing, no  edema.  NEUROLOGIC: No focal deficit, gait not tested, speech is fluent. SKIN: Intact,warm,dry.  Multiple ecchymotic areas on the upper extremities. PSYCH: Mood and behavior normal.   Representative images from PET/CT performed 06 April 2023 showing the adenopathy in question:     Assessment & Plan:     ICD-10-CM   1. Mediastinal adenopathy  R59.0     2. History of adenocarcinoma of lung  Z85.118     3. Personal history of pulmonary embolism  Z86.711     4. Chronic anticoagulation  Z79.01      Discussion:    Mediastinal Adenopathy Mediastinal adenopathy noted on PET CT with a history of non-small cell lung cancer. Prominent lymph nodes in the mediastinum, particularly on the right side before the tracheal bifurcation. Differential includes inflammation versus malignancy. Recent nasogastric tube placement and hospitalization for bowel obstruction may contribute to inflammation. Discussed EBUS with biopsy, including procedure details, outpatient nature, 20-30 minute duration, and need to stop warfarin 3-5 days prior. Emphasized importance of biopsy for accurate diagnosis. - Schedule EBUS with biopsy for May 09, 2023 - Instruct to stop warfarin 5 days prior to  procedure - Follow up in 4-6 weeks post-procedure  Non-Small Cell Lung Cancer Treated with radiation. Current PET CT shows activity in previously treated areas, which is expected. Emphasized monitoring for recurrence or new symptoms and evaluating EBUS biopsy results to rule out malignancy. - Monitor for recurrence or new symptoms - Evaluate EBUS biopsy results to rule out malignancy  Bowel Obstruction Recent hospitalization for bowel obstruction treated with nasogastric tube placement. No current symptoms. Emphasized monitoring for recurrence and seeking immediate care if symptoms reoccur. - Monitor for recurrence of symptoms - Advise to seek immediate care if symptoms reoccur  General Health Maintenance Non-smoker, manages own warfarin therapy.  Primary care manages refills - Continue current health maintenance practices  Follow-up - Schedule follow-up appointment 4-6 weeks post-procedure.        Gailen Shelter, MD Advanced [facility]    *This note was dictated using voice recognition software/Dragon.  Despite best efforts to proofread, errors can occur which can change the meaning. Any transcriptional errors that result from this process are unintentional and may not be fully corrected at the time of dictation.

## 2023-04-14 NOTE — Patient Instructions (Addendum)
VISIT SUMMARY:  Kara [name], during your visit today, we discussed the findings of mediastinal adenopathy from your recent PET CT scan. We also reviewed your recent hospitalization for a bowel obstruction and the complications you experienced. Additionally, we talked about your ongoing management of non-small cell lung cancer and your general health maintenance.  YOUR PLAN:  -MEDIASTINAL ADENOPATHY: Mediastinal adenopathy means there are enlarged lymph nodes in the area between your lungs. This can be due to inflammation or cancer. We discussed performing an EBUS with biopsy to determine the cause. Please stop taking warfarin 5 days before the procedure, which is scheduled for May 09, 2023. We will follow up 4-6 weeks after the procedure to review the results.  -NON-SMALL CELL LUNG CANCER: Non-small cell lung cancer is a type of lung cancer that you have been treated for with radiation. The recent PET CT shows activity in the treated areas, which is expected. We will monitor for any recurrence or new symptoms and evaluate the biopsy results from the EBUS to rule out any malignancy.  -SMALL BOWEL OBSTRUCTION: A bowel obstruction is a blockage in the intestines. You were recently hospitalized for this and treated with a nasogastric tube. Currently, you have no symptoms, but it is important to monitor for any recurrence and seek immediate care if symptoms return.  -GENERAL HEALTH MAINTENANCE: You are a non-smoker and manage your own warfarin therapy. Please continue with your current health maintenance practices.  We discussed that the procedure would have to be done under general anesthesia. The anesthesia team will discuss his part of the process with you.  Complications from the procedure itself are usually minor. One potential complication would be collapse of the lung which can occur in 2-3% of the cases. If  this happens we would have to put a small tube to relieve the collapse and you would  have to spend the night in the hospital in that event.  Another possibility would be that of bleeding, this is usually taken care of during the procedure.  In the situations you may need to be observed overnight.  For the most part though, should be able to go home the same day.  Other possibilities could include that the procedure would be nondiagnostic meaning that no definitive diagnosis could be attained.  We strive to try to decrease these potential issues.  INSTRUCTIONS:  Please stop taking warfarin 5 days before your EBUS with biopsy scheduled for May 09, 2023. Schedule a follow-up appointment 4-6 weeks after the procedure to review the results.

## 2023-04-14 NOTE — Telephone Encounter (Signed)
Pre Admit- 05/02/2023  8am-1pm  Patient is aware.

## 2023-04-17 NOTE — Progress Notes (Signed)
Hematology/Oncology Consult note [facility]  Telephone:([phone] [phone]  Patient Care Team: Barbette Reichmann, MD as PCP - General (Internal Medicine) Glory Buff, RN as Oncology Nurse Navigator Creig Hines, MD as Consulting Physician (Oncology)   Name of the patient: Kara [name]  [id number]  [birth year]   Date of visit: 04/17/23  Diagnosis-  right upper lobe lung cancer s/p SBRT     Chief complaint/ Reason for visit-PET scan results and further management  Heme/Onc history: Patient is a 72 year old female underwent CT chest with contrast in May 2022 to follow-up on lung nodule that was incidentally noted on CT abdomen in March 2022 when she was admitted for COVID.  At that time she was noted to have a right lower lobe 1.3 x 1.1 cm right lower lobe nodule.  CT chest in May 2022 showed right upper lobe nodule measuring 1.4 cm which was also hypermetabolic on PET scan with an SUV of 3.3.  No other enlarged mediastinal or hilar lymph nodes or distant metastatic disease was noted.   CT scan in August 2022 showed mild increase in the size of the right upper lobe lung nodule to 1.8 cm.  No evidence of locoregional adenopathy.she underwent SBRT to in December 2022  Interval history- Patient was admitted to the hospital recently for symptoms of abdominal pain and chest pain which resolved with conservative measures.  She underwent CT angio chest which showed possible concerns for worsening adenopathy but did not show any evidence of PE.  Presently she feels better since her discharge.  She has baseline fatigue and exertional shortness of breath but she is not on any home oxygen  ECOG PS- 2 Pain scale- 0  Review of systems- Review of Systems  Constitutional:  Positive for malaise/fatigue. Negative for chills, fever and weight loss.  HENT:  Negative for congestion, ear discharge and nosebleeds.   Eyes:  Negative for blurred vision.   Respiratory:  Positive for shortness of breath. Negative for cough, hemoptysis, sputum production and wheezing.   Cardiovascular:  Negative for chest pain, palpitations, orthopnea and claudication.  Gastrointestinal:  Negative for abdominal pain, blood in stool, constipation, diarrhea, heartburn, melena, nausea and vomiting.  Genitourinary:  Negative for dysuria, flank pain, frequency, hematuria and urgency.  Musculoskeletal:  Negative for back pain, joint pain and myalgias.  Skin:  Negative for rash.  Neurological:  Negative for dizziness, tingling, focal weakness, seizures, weakness and headaches.  Endo/Heme/Allergies:  Does not bruise/bleed easily.  Psychiatric/Behavioral:  Negative for depression and suicidal ideas. The patient does not have insomnia.       Allergies  Allergen Reactions   Adhesive [Tape] Other (See Comments)    Pt reports, when removing tape from skin most tapes pull her skin off. 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, 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   CHF (congestive heart failure) (HCC)    followed colon resection, "wouldn't let me get up"   Diabetes (HCC)    type 2   Diverticulitis 2018   History of being hospitalized    1 month ago for 3 days,  vomiting, and kink in upper intestine   History of hiatal hernia    Hypertension    Osteoporosis    PE (pulmonary thromboembolism) (HCC)    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HCC)    Vertigo      Past Surgical History:  Procedure Laterality Date   APPENDECTOMY N/A 02/24/2017   Procedure: Incidental  APPENDECTOMY;  Surgeon: Ricarda Frame, MD;  Location: [name];  Service: General;  Laterality: N/A;   CARPAL TUNNEL RELEASE  Bilateral    CATARACT EXTRACTION W/PHACO Left 11/21/2018   Procedure: CATARACT EXTRACTION PHACO AND INTRAOCULAR LENS PLACEMENT (IOC) LEFT DIABETES;  Surgeon: Galen Manila, MD;  Location: [facility];  Service: Ophthalmology;  Laterality: Left;  latex sensitivity Diabetic - oral meds   CATARACT EXTRACTION W/PHACO Right 12/12/2018   Procedure: CATARACT EXTRACTION PHACO AND INTRAOCULAR LENS PLACEMENT (IOC) RIGHT DIABETES;  Surgeon: Galen Manila, MD;  Location: [facility];  Service: Ophthalmology;  Laterality: Right;  Diabetes-oral med Latex sensitiviy   COLON RESECTION SIGMOID N/A 11/02/2016   Procedure: COLON RESECTION SIGMOID;  Surgeon: Ricarda Frame, MD;  Location: [name];  Service: General;  Laterality: N/A;   COLON SURGERY  11/02/2016   COLONOSCOPY WITH PROPOFOL N/A 02/03/2017   Procedure: COLONOSCOPY WITH PROPOFOL;  Surgeon: Toney Reil, MD;  Location: [facility];  Service: Gastroenterology;  Laterality: N/A;   COLOSTOMY N/A 11/02/2016   Procedure: COLOSTOMY;  Surgeon: Ricarda Frame, MD;  Location: [name];  Service: General;  Laterality: N/A;   COLOSTOMY REVERSAL N/A 02/24/2017   Procedure: COLOSTOMY REVERSAL, ostomy takedown, spleenic flexure mobilization, excision rectal stump/distal sigmoid, anastomosis with suture reinforcement;  Surgeon: Ricarda Frame, MD;  Location: [name];  Service: General;  Laterality: N/A;   ILEO LOOP DIVERSION N/A 02/24/2017   Procedure: ILEO LOOP COLOSTOMY;  Surgeon: Ricarda Frame, MD;  Location: [name];  Service: General;  Laterality: N/A;   ILEOSTOMY CLOSURE N/A 06/01/2017   Procedure: LOOP ILEOSTOMY TAKEDOWN;  Surgeon: Ricarda Frame, MD;  Location: [name];  Service: General;  Laterality: N/A;   IVC FILTER INSERTION Right 10/2016   IVC FILTER REMOVAL N/A 08/09/2017   Procedure: IVC FILTER REMOVAL;  Surgeon: Renford Dills, MD;  Location: [name];  Service: Cardiovascular;  Laterality: N/A;    KNEE SURGERY     torn menicus   LAPAROSCOPY N/A 02/24/2017   Procedure: LAPAROSCOPY DIAGNOSTIC;  Surgeon: Ricarda Frame, MD;  Location: [name];  Service: General;  Laterality: N/A;   LYSIS OF ADHESION N/A 02/24/2017   Procedure: LYSIS OF ADHESION;  Surgeon: Ricarda Frame, MD;  Location: [name];  Service: General;  Laterality: N/A;   PULMONARY VENOGRAPHY N/A 11/19/2016   Procedure: Pulmonary Venography; IVC filter placement; possible pulmonary thrombectomy;  Surgeon: Renford Dills, MD;  Location: [name];  Service: Cardiovascular;  Laterality: N/A;   SACROPLASTY N/A 11/08/2017   Procedure: SACROPLASTY S2;  Surgeon: Kennedy Bucker, MD;  Location: [name];  Service: Orthopedics;  Laterality: N/A;   SIGMOIDOSCOPY N/A 11/13/2016   Procedure: endoscopic  flexible SIGMOIDOSCOPY;  Surgeon: Lattie Haw, MD;  Location: [name];  Service: General;  Laterality: N/A;   SIGMOIDOSCOPY N/A 05/19/2017   Procedure: Arnell Sieving;  Surgeon: Ricarda Frame, MD;  Location: [name];  Service: General;  Laterality: N/A;   VIDEO BRONCHOSCOPY WITH ENDOBRONCHIAL NAVIGATION N/A 02/23/2021   Procedure: ROBOTIC VIDEO BRONCHOSCOPY WITH ENDOBRONCHIAL NAVIGATION;  Surgeon: [name], MD;  Location: [name];  Service: Pulmonary;  Laterality: N/A;  Social History   Socioeconomic History   Marital status: Married    Spouse name: Not on file   Number of children: Not on file   Years of education: Not on file   Highest education level: Not on file  Occupational History   Not on file  Tobacco Use   Smoking status: Never    Passive exposure: Current (husband smokes)   Smokeless tobacco: Never  Vaping Use   Vaping status: Never Used  Substance and Sexual Activity   Alcohol use: No    Alcohol/week: 0.0 standard drinks of alcohol   Drug use: No   Sexual activity: Never  Other Topics Concern   Not on file  Social History Narrative   Not on file   Social  Determinants of Health   Financial Resource Strain: Low Risk  (12/01/2022)   Received from [facility]   Overall Financial Resource Strain (CARDIA)    Difficulty of Paying Living Expenses: Not hard at all  Food Insecurity: No Food Insecurity (03/21/2023)   Hunger Vital Sign    Worried About Running Out of Food in the Last Year: Never true    Ran Out of Food in the Last Year: Never true  Transportation Needs: No Transportation Needs (03/21/2023)   PRAPARE - Administrator, Civil Service (Medical): No    Lack of Transportation (Non-Medical): No  Physical Activity: Not on file  Stress: Not on file  Social Connections: Not on file  Intimate Partner Violence: Not At Risk (03/21/2023)   Humiliation, Afraid, Rape, and Kick questionnaire    Fear of Current or Ex-Partner: No    Emotionally Abused: No    Physically Abused: No    Sexually Abused: No    Family History  Problem Relation Age of Onset   Diabetes Mother    Heart disease Mother    Cancer Mother    Breast cancer Mother        >50   Heart disease Father    Diabetes Sister    Heart disease Sister      Current Outpatient Medications:    calcium carbonate (CALCIUM 600) 600 MG TABS tablet, Take 1 tablet (600 mg total) by mouth 2 (two) times daily with a meal., Disp: 60 tablet, Rfl: 5   candesartan (ATACAND) 16 MG tablet, Take 16 mg by mouth in the morning., Disp: , Rfl:    cholecalciferol (VITAMIN D) 25 MCG (1000 UNIT) tablet, Take 1,000 Units by mouth in the morning., Disp: , Rfl:    Cyanocobalamin (VITAMIN B-12) 5000 MCG TBDP, Take 5,000 mcg by mouth in the morning., Disp: , Rfl:    cyclobenzaprine (FLEXERIL) 10 MG tablet, Take 10 mg by mouth at bedtime., Disp: , Rfl:    Dulaglutide 1.5 MG/0.5ML SOPN, Inject 1.5 mg into the skin every Saturday., Disp: , Rfl:    gabapentin (NEURONTIN) 100 MG capsule, Take 200 mg by mouth with breakfast, with lunch, and with evening meal., Disp: , Rfl:    gabapentin  (NEURONTIN) 300 MG capsule, Take 300 mg by mouth at bedtime., Disp: , Rfl:    glipiZIDE (GLUCOTROL) 10 MG tablet, Take 10 mg by mouth daily before breakfast., Disp: , Rfl:    HYDROcodone-acetaminophen (NORCO/VICODIN) 5-325 MG tablet, Take 1 tablet by mouth every 6 (six) hours as needed for moderate pain., Disp: , Rfl:    Magnesium Oxide -Mg Supplement 500 MG TABS, Take by mouth., Disp: , Rfl:    meclizine (ANTIVERT) 25 MG  tablet, Take 25 mg by mouth 3 (three) times daily as needed for dizziness., Disp: , Rfl:    oxybutynin (DITROPAN) 5 MG tablet, Take 1 tablet by mouth 2 (two) times daily., Disp: , Rfl:    rOPINIRole (REQUIP) 0.5 MG tablet, Take 0.5 mg by mouth at bedtime., Disp: , Rfl:    warfarin (COUMADIN) 6 MG tablet, Take 6 mg by mouth daily., Disp: , Rfl:   Physical exam:  Vitals:   04/13/23 1403  BP: (!) 115/57  Pulse: 88  Resp: 18  Temp: 98.8 F (37.1 C)  TempSrc: Tympanic  SpO2: 97%  Weight: 203 lb 1.6 oz (92.1 kg)   Physical Exam Cardiovascular:     Rate and Rhythm: Normal rate and regular rhythm.     Heart sounds: Normal heart sounds.  Pulmonary:     Effort: Pulmonary effort is normal.     Breath sounds: Normal breath sounds.  Skin:    General: Skin is warm and dry.  Neurological:     Mental Status: She is alert and oriented to person, place, and time.         Latest Ref Rng & Units 03/22/2023    2:31 AM  CMP  Glucose 70 - 99 mg/dL 244   BUN 8 - 23 mg/dL 17   Creatinine 0.10 - 1.00 mg/dL 2.72   Sodium 536 - 644 mmol/L 135   Potassium 3.5 - 5.1 mmol/L 3.9   Chloride 98 - 111 mmol/L 106   CO2 22 - 32 mmol/L 20   Calcium 8.9 - 10.3 mg/dL 8.3       Latest Ref Rng & Units 03/22/2023    2:31 AM  CBC  WBC 4.0 - 10.5 K/uL 8.4   Hemoglobin 12.0 - 15.0 g/dL 03.4   Hematocrit 74.2 - 46.0 % 38.7   Platelets 150 - 400 K/uL 226     No images are attached to the encounter.  NM PET Image Restag (PS) Skull Base To Thigh  Result Date: 04/13/2023 CLINICAL DATA:   Subsequent treatment strategy for non-small-cell lung cancer. EXAM: NUCLEAR MEDICINE PET SKULL BASE TO THIGH TECHNIQUE: 10.3 mCi F-18 FDG was injected intravenously. Full-ring PET imaging was performed from the skull base to thigh after the radiotracer. CT data was obtained and used for attenuation correction and anatomic localization. Fasting blood glucose: 94 mg/dl COMPARISON:  CTA chest abdomen and pelvis 03/20/2023. Chest CT 01/14/2023. PET-CT 11/04/2020 FINDINGS: Mediastinal blood pool activity: SUV max 2.5 Liver activity: SUV max NA NECK: 10 mm short axis left-sided level II cervical lymph node (image 13/series 6) is hypermetabolic with SUV max = 8.8, progressive since prior PET-CT. Incidental CT findings: None. CHEST: Small mediastinal lymph nodes are more confluent than on previous PET-CT but similar to the 03/20/2023 exam. 10 mm short axis precarinal node on 46/6 is hypermetabolic with SUV max = 5.3. Hypermetabolism is seen in the amorphous post treatment soft tissue changes in the right hilum with SUV max = 6.4. Hypermetabolic lymph nodes are seen in the right hilum. 9 mm short axis subcarinal node on 50/6 is hypermetabolic with SUV max = 6.8. The clustered nodularity seen on the 01/14/2023 exam in the medial right base and for which follow-up was recommended shows no hypermetabolism on PET images. There is a new 15 x 12 mm nodular opacity at the dome of the right hemidiaphragm (55/6 today) new since 01/14/2023 and demonstrating low level FDG accumulation/borderline hypermetabolism with SUV max = 3.7. Small focus of hypermetabolism associated  with focal skin thickening in the low left axillary region is likely infectious/inflammatory. Incidental CT findings: There is mild atherosclerotic calcification of the abdominal aorta without aneurysm. Tiny right pleural effusion evident. Post treatment scarring noted parahilar right lung with some peripheral nodularity, similar to prior studies (48/6) showing low  level hypermetabolism. ABDOMEN/PELVIS: No abnormal hypermetabolic activity within the liver, pancreas, adrenal glands, or spleen. No hypermetabolic lymph nodes in the abdomen or pelvis. 12 mm nodular soft tissue focus in the subcutaneous fat of the right anterior abdominal wall (90/6) is new since prior PET-CT in shows low level FDG uptake with SUV max = 1.7. This may be related to an injection granuloma. Incidental CT findings: Calcified gallstones again noted. Midline ventral hernia contains a short segment of transverse colon without complicating features. SKELETON: No focal hypermetabolic activity to suggest skeletal metastasis. Incidental CT findings: Degenerative spondylolisthesis noted L4-5 and L5-S1. IMPRESSION: 1. Hypermetabolic left cervical, mediastinal and right hilar lymph nodes, progressive since prior PET-CT. Imaging features are suspicious for metastatic disease. 2. Hypermetabolism is identified in the amorphous soft tissue/treatment related scarring in the right hilar region. Imaging features highly concerning for recurrent disease. 3. New 15 x 12 mm nodular lung opacity at the dome of the right hemidiaphragm is new since 01/14/2023 and demonstrates low level FDG accumulation/borderline hypermetabolism. Imaging features are nonspecific and could be infectious/inflammatory or neoplastic. 4. The clustered nodularity seen on the 01/14/2023 exam in the medial right base and for which follow-up was recommended shows no hypermetabolism on PET images. 5. No evidence for metastatic disease in the abdomen or pelvis. 6. 12 mm nodular soft tissue focus in the subcutaneous fat of the right anterior abdominal wall is new since prior PET-CT and shows low level FDG uptake. This may be related to an injection granuloma. 7. Cholelithiasis. 8. Midline ventral hernia contains a short segment of transverse colon without complicating features. 9.  Aortic Atherosclerosis (ICD10-I70.0). Electronically Signed   By: Kennith Center M.D.   On: 04/13/2023 11:48   DG ABD ACUTE 2+V W 1V CHEST  Result Date: 03/21/2023 CLINICAL DATA:  Postoperative ileus. EXAM: DG ABDOMEN ACUTE WITH 1 VIEW CHEST COMPARISON:  March 20, 2023. FINDINGS: There is no evidence of dilated bowel loops or free intraperitoneal air. No radiopaque calculi or other significant radiographic abnormality is seen. Heart size and mediastinal contours are within normal limits. Right upper lobe airspace opacity is noted consistent with pneumonia or atelectasis. Nasogastric tube tip is seen in expected position of distal stomach. IMPRESSION: No abnormal bowel dilatation. Right upper lobe airspace opacity is noted concerning for pneumonia or atelectasis. Electronically Signed   By: Lupita Raider M.D.   On: 03/21/2023 09:12   US ABDOMEN LIMITED RUQ (LIVER/GB)  Result Date: 03/20/2023 CLINICAL DATA:  Abdomen pain EXAM: ULTRASOUND ABDOMEN LIMITED RIGHT UPPER QUADRANT COMPARISON:  CT 03/20/2023 FINDINGS: Gallbladder: Gallstones.  Negative sonographic Murphy.  Normal wall thickness Common bile duct: Diameter: 6.8 mm Liver: Echogenic liver parenchyma. No focal hepatic abnormality. Portal vein is patent on color Doppler imaging with normal direction of blood flow towards the liver. Other: None. IMPRESSION: 1. Cholelithiasis without sonographic evidence for acute cholecystitis. 2. Upper normal common bile duct measuring 6.8 mm. Correlate with LFTs. 3. Echogenic liver parenchyma consistent with hepatic steatosis. Electronically Signed   By: Jasmine Pang M.D.   On: 03/20/2023 23:35   DG Abd Portable 1 View  Result Date: 03/20/2023 CLINICAL DATA:  Enteric catheter placement EXAM: PORTABLE ABDOMEN - 1 VIEW  COMPARISON:  03/20/2023 FINDINGS: Frontal view of the lower chest and upper abdomen demonstrates enteric catheter passing below diaphragm, tip projecting over the gastric body. Stable right perihilar consolidation consistent with patient's history of known lung cancer.  IMPRESSION: 1. Enteric catheter tip projecting over the gastric body. Electronically Signed   By: Sharlet Salina M.D.   On: 03/20/2023 20:44   CT Angio Chest/Abd/Pel for Dissection W and/or Wo Contrast  Result Date: 03/20/2023 CLINICAL DATA:  Chest and abdominal pain, acute aortic syndrome suspected, history of lung cancer * Tracking Code: BO * EXAM: CT ANGIOGRAPHY CHEST, ABDOMEN AND PELVIS TECHNIQUE: Non-contrast CT of the chest was initially obtained. Multidetector CT imaging through the chest, abdomen and pelvis was performed using the standard protocol during bolus administration of intravenous contrast. Multiplanar reconstructed images and MIPs were obtained and reviewed to evaluate the vascular anatomy. RADIATION DOSE REDUCTION: This exam was performed according to the departmental dose-optimization program which includes automated exposure control, adjustment of the mA and/or kV according to patient size and/or use of iterative reconstruction technique. CONTRAST:  OMNIPAQUE IOHEXOL 350 MG/ML SOLN COMPARISON:  CT chest, 01/14/2023, CT abdomen pelvis, 11/20/2021 FINDINGS: CTA CHEST FINDINGS VASCULAR Aorta: Satisfactory opacification of the aorta. Normal contour and caliber of the thoracic aorta. No evidence of aneurysm, dissection, or other acute aortic pathology. Mild aortic atherosclerosis Cardiovascular: No evidence of pulmonary embolism on limited non-tailored examination. Normal heart size. Scattered left coronary artery calcifications. No pericardial effusion. Review of the MIP images confirms the above findings. NON VASCULAR Mediastinum/Nodes: Unchanged prominent pretracheal and right hilar lymph nodes (series 5, image 42). Thyroid gland, trachea, and esophagus demonstrate no significant findings. Lungs/Pleura: Unchanged post treatment appearance of the chest, with dense perihilar consolidation and fibrosis of the right lung (series 6, image 48). There are again noted multiple small pleural  nodules, particularly about the right lung base (series 6, image 96, as well as along the superior margin of the treated mass in the right upper lobe (series 6, image 46). No pleural effusion or pneumothorax. Musculoskeletal: No chest wall abnormality. No acute osseous findings. Review of the MIP images confirms the above findings. CTA ABDOMEN AND PELVIS FINDINGS VASCULAR Normal contour and caliber of the abdominal aorta. No evidence of aneurysm, dissection, or other acute aortic pathology. Standard branching pattern of the abdominal aorta with solitary bilateral renal arteries. Mild aortic atherosclerosis. Review of the MIP images confirms the above findings. NON-VASCULAR Hepatobiliary: No solid liver abnormality is seen. Small gallstones. No gallbladder wall thickening, or biliary dilatation. Pancreas: Unremarkable. No pancreatic ductal dilatation or surrounding inflammatory changes. Spleen: Normal in size without significant abnormality. Adrenals/Urinary Tract: Adrenal glands are unremarkable. Kidneys are normal, without renal calculi, solid lesion, or hydronephrosis. Bladder is unremarkable. Stomach/Bowel: Stomach is within normal limits. Status post appendectomy. Mildly distended, fecalized loops of distal small bowel in the mid low abdomen measuring up to 4.1 cm in caliber, with tethered appearance and relatively abrupt caliber change, several loops of sharply decompressed terminal ileum (series 5, image 214). There is however gas and stool present throughout the colon to the rectum. Lymphatic: No enlarged abdominal or pelvic lymph nodes. Reproductive: No mass or other significant abnormality. Other: Midline ventral hernia containing a single nonobstructed loop of transverse colon (series 5, image 161). No ascites. Musculoskeletal: No acute osseous findings. IMPRESSION: 1. Normal contour and caliber of the thoracic and abdominal aorta. No evidence of aneurysm, dissection, or other acute aortic pathology. Mild  aortic atherosclerosis. 2. Mildly distended, fecalized loops  of distal small bowel in the mid low abdomen, with tethered appearance and relatively abrupt caliber change, several loops of sharply decompressed terminal ileum. There is however gas and stool present throughout the colon to the rectum. Findings are consistent with partial or developing small bowel obstruction, possibly due to adhesions. Overall appearance and configuration is similar to prior examinations. 3. Unchanged post treatment appearance of the chest, with dense perihilar consolidation and fibrosis of the right lung. There are again noted multiple small pleural nodules, particularly about the right lung base, as well as along the superior margin of the treated mass in the right upper lobe, as noted on prior restaging examination dated 01/14/2023. These are worrisome for recurrent/metastatic malignancy. 4. Unchanged prominent pretracheal and right hilar lymph nodes. 5. Midline ventral hernia containing a single nonobstructed loop of transverse colon. 6. Cholelithiasis. 7. Coronary artery disease. Aortic Atherosclerosis (ICD10-I70.0). Electronically Signed   By: Jearld Lesch M.D.   On: 03/20/2023 19:52   DG Chest 2 View  Result Date: 03/20/2023 CLINICAL DATA:  Chest pain. History of treated lung cancer status post radiation. EXAM: CHEST - 2 VIEW COMPARISON:  February 23, 2021, January 14, 2023 FINDINGS: The cardiomediastinal silhouette is unchanged in contour.Revisualization of a band like opacity in the RIGHT mid lung most consistent with post radiation change, grossly similar in comparison to prior CT. Atherosclerotic calcifications. No pleural effusion. No pneumothorax. No acute pleuroparenchymal abnormality. Visualized abdomen is unremarkable. Multilevel degenerative changes of the thoracic spine. IMPRESSION: 1. No acute cardiopulmonary abnormality. 2. Post radiation changes in the RIGHT mid lung. Electronically Signed   By: Meda Klinefelter M.D.   On: 03/20/2023 18:29     Assessment and plan- Patient is a 72 y.o. female here for right upper lobe lung cancer status post SBRTHere to discuss PET CT scan results and further management  I have reviewed PET CT scan images independently and discussed findings with the patient.  Present scan shows hypermetabolic adenopathy involving hilar and mediastinal areas.  Hypermetabolism in the amorphus posttreatment tissue in the right hilum measuring SUV 6.4.  Clustered nodularity that was seen in August CT scan in the right base of the lung did not show any evidence of hypermetabolism.  15 mm nodular opacity in the right hemidiaphragm with an SUV of 3.7.  This is overall concerning for recurrent disease.  I will refer her back to pulmonary for consideration of EBUS and biopsy.  I will see her after biopsy results are back to discuss further management   Visit Diagnosis 1. History of lung cancer   2. Mediastinal adenopathy      Dr. Owens Shark, MD, MPH [facility] [phone] 04/17/2023 12:41 PM

## 2023-04-18 DIAGNOSIS — Z86711 Personal history of pulmonary embolism: Secondary | ICD-10-CM | POA: Diagnosis not present

## 2023-04-18 DIAGNOSIS — Z7901 Long term (current) use of anticoagulants: Secondary | ICD-10-CM | POA: Diagnosis not present

## 2023-04-24 DIAGNOSIS — R590 Localized enlarged lymph nodes: Secondary | ICD-10-CM

## 2023-04-24 HISTORY — DX: Localized enlarged lymph nodes: R59.0

## 2023-04-25 ENCOUNTER — Other Ambulatory Visit: Payer: PPO

## 2023-04-30 ENCOUNTER — Ambulatory Visit (HOSPITAL_COMMUNITY): Payer: PPO | Admitting: Urgent Care

## 2023-05-02 ENCOUNTER — Encounter
Admission: RE | Admit: 2023-05-02 | Discharge: 2023-05-02 | Disposition: A | Discharge status: Home / Self Care | Payer: PPO | Source: Ambulatory Visit | Attending: Pulmonary Disease | Admitting: Pulmonary Disease

## 2023-05-02 ENCOUNTER — Other Ambulatory Visit: Payer: Self-pay

## 2023-05-02 VITALS — Ht 63.0 in | Wt 201.0 lb

## 2023-05-02 DIAGNOSIS — Z01812 Encounter for preprocedural laboratory examination: Secondary | ICD-10-CM

## 2023-05-02 DIAGNOSIS — E1169 Type 2 diabetes mellitus with other specified complication: Secondary | ICD-10-CM

## 2023-05-02 DIAGNOSIS — Z7901 Long term (current) use of anticoagulants: Secondary | ICD-10-CM

## 2023-05-02 HISTORY — DX: Essential (primary) hypertension: I10

## 2023-05-02 HISTORY — DX: Other intervertebral disc degeneration, lumbar region without mention of lumbar back pain or lower extremity pain: M51.369

## 2023-05-02 HISTORY — DX: Obesity, unspecified: E66.9

## 2023-05-02 HISTORY — DX: Diverticulosis of intestine, part unspecified, without perforation or abscess without bleeding: K57.90

## 2023-05-02 HISTORY — DX: Type 2 diabetes mellitus with other specified complication: E11.69

## 2023-05-02 HISTORY — DX: Unspecified osteoarthritis, unspecified site: M19.90

## 2023-05-02 HISTORY — DX: Pneumonia, unspecified organism: J18.9

## 2023-05-02 HISTORY — DX: Carrier or suspected carrier of methicillin resistant Staphylococcus aureus: Z22.322

## 2023-05-02 HISTORY — DX: Long term (current) use of anticoagulants: Z79.01

## 2023-05-02 NOTE — Telephone Encounter (Signed)
For the codes 38182, [id number], [id number] Auth # [id number] valid 04/29/2023 to 08/07/2023

## 2023-05-02 NOTE — Telephone Encounter (Signed)
Nothing further needed 

## 2023-05-02 NOTE — Patient Instructions (Addendum)
Your procedure is scheduled on: Monday, December 16 Report to the Registration Desk on the 1st floor of the [facility] To find out your arrival time, please call [phone] between 1PM - 3PM on: Friday, December 13 If your arrival time is 6:00 am, do not arrive before that time as the [facility] entrance doors do not open until 6:00 am.  REMEMBER: Instructions that are not followed completely may result in serious medical risk, up to and including death; or upon the discretion of your surgeon and anesthesiologist your surgery may need to be rescheduled.  Do not eat or drink after midnight the night before surgery.  No gum chewing or hard candies.  One week prior to surgery: starting December 9 Stop Anti-inflammatories (NSAIDS) such as Advil, Aleve, Ibuprofen, Motrin, Naproxen, Naprosyn and Aspirin based products such as Excedrin, Goody's Powder, BC Powder. Stop ANY OVER THE COUNTER supplements until after surgery. Stop calcium, vitamin D, vitamin B, magnesium.  You may however, continue to take Tylenol if needed for pain up until the day of surgery.  Warfarin (Coumadin) - per Dr. Jayme Cloud stop 5 days before surgery. Last day to TAKE is Tuesday, December 10. Resume AFTER surgery per surgeon instruction.  Dulaglutide (Trulicity) - hold for 7 days before surgery. Do NOT take on Saturday, December 14. Resume AFTER surgery on your regular weekly day, Saturday, December 21.  Continue taking all of your other prescription medications up until the day of surgery.  ON THE DAY OF SURGERY ONLY TAKE THESE MEDICATIONS WITH SIPS OF WATER:  Gabapentin Hydrocodone if needed for pain  No Alcohol for 24 hours before or after surgery.  No Smoking including e-cigarettes for 24 hours before surgery.  No chewable tobacco products for at least 6 hours before surgery.  No nicotine patches on the day of surgery.  Do not use any "recreational" drugs for at least a week (preferably 2 weeks) before  your surgery.  Please be advised that the combination of cocaine and anesthesia may have negative outcomes, up to and including death. If you test positive for cocaine, your surgery will be cancelled.  On the morning of surgery brush your teeth with toothpaste and water, you may rinse your mouth with mouthwash if you wish. Do not swallow any toothpaste or mouthwash.  Do not wear jewelry, make-up, hairpins, clips or nail polish.  For welded (permanent) jewelry: bracelets, anklets, waist bands, etc.  Please have this removed prior to surgery.  If it is not removed, there is a chance that hospital personnel will need to cut it off on the day of surgery.  Do not wear lotions, powders, or perfumes.   Do not shave body hair from the neck down 48 hours before surgery.  Contact lenses, hearing aids and dentures may not be worn into surgery.  Do not bring valuables to the hospital. [facility] is not responsible for any missing/lost belongings or valuables.   Notify your doctor if there is any change in your medical condition (cold, fever, infection).  Wear comfortable clothing (specific to your surgery type) to the hospital.  After surgery, you can help prevent lung complications by doing breathing exercises.  Take deep breaths and cough every 1-2 hours.   If you are being discharged the day of surgery, you will not be allowed to drive home. You will need a responsible individual to drive you home and stay with you for 24 hours after surgery.   If you are taking public transportation, you  will need to have a responsible individual with you.  Please call the Pre-admissions Testing Dept. at [phone] if you have any questions about these instructions.  Surgery Visitation Policy:  Patients having surgery or a procedure may have two visitors.  Children under the age of 52 must have an adult with them who is not the patient.

## 2023-05-03 DIAGNOSIS — Z7901 Long term (current) use of anticoagulants: Secondary | ICD-10-CM | POA: Diagnosis not present

## 2023-05-03 DIAGNOSIS — Z86711 Personal history of pulmonary embolism: Secondary | ICD-10-CM | POA: Diagnosis not present

## 2023-05-03 NOTE — Progress Notes (Signed)
  Perioperative Services Pre-Admission/Anesthesia Testing    Date: 05/03/23  Name: Kara [name] MRN:   [id number]  Re: GLP-1 clearance and provider recommendations   Planned Surgical Procedure(s):    Case: [id number] Date/Time: 05/09/23 1215   Procedures:      FLEXIBLE BRONCHOSCOPY     ENDOBRONCHIAL ULTRASOUND   Anesthesia type: General   Pre-op diagnosis: Lung Nodule   Location: [name] PROCEDURE RM 02 / [name] ORS FOR ANESTHESIA GROUP   Surgeons: Kara Saner, MD      Clinical Notes:  Patient is scheduled for the above procedure with the indicated provider/surgeon. In review of her medication reconciliation it was noted that patient is on a prescribed GLP-1 medication. Per guidelines issued by the American Society of Anesthesiologists (ASA), it is recommended that these medications be held for 7 days prior to the patient undergoing any type of elective surgical procedure. The patient is taking the following GLP-1 medication:  []  SEMAGLUTIDE   []  EXENATIDE  []  LIRAGLUTIDE   []  LIXISENATIDE  [x]  DULAGLUTIDE     []  TIRZEPATIDE (GLP-1/GIP)  Reached out to prescribing provider Kara Fennel, MD) to make them aware of the guidelines from anesthesia. Given that this patient takes the prescribed GLP-1 medication for her  diabetes diagnosis, rather than for weight loss, recommendations from the prescribing provider were solicited. Prescribing provider made aware of the following so that informed decision/POC can be developed for this patient that may be taking medications belonging to these drug classes:  Oral GLP-1 medications will be held 1 day prior to surgery.  Injectable GLP-1 medications will be held 7 days prior to surgery.  Metformin is routinely held 48 hours prior to surgery due to renal concerns, potential need for contrasted imaging perioperatively, and the potential for tissue hypoxia leading to drug induced lactic acidosis.  All SGLT2i medications are held 72 hours  prior to surgery as they can be associated with the increased potential for developing euglycemic diabetic ketoacidosis (EDKA).   Impression and Plan:  Kara [name] is on a prescribed GLP-1 medication, which induces the known side effect of decreased gastric emptying. Efforts are bring made to mitigate the risk of perioperative hyperglycemic events, as elevated blood glucose levels have been found to contribute to intra/postoperative complications. Additionally, hyperglycemic extremes can potentially necessitate the postponing of a patient's elective case in order to better optimize perioperative glycemic control, again with the aforementioned guidelines in place. With this in mind, recommendations have been sought from the prescribing provider, who has cleared patient to proceed with holding the prescribed GLP-1 as per the guidelines from the ASA.   Provider recommending: no further recommendations received from the prescribing provider.  Copy of signed clearance and recommendations placed on patient's chart for inclusion in their medical record and for review by the surgical/anesthetic team on the day of her procedure.   Kara Mulling, MSN, APRN, FNP-C, CEN [facility]  Perioperative Services Nurse Practitioner Phone: [phone] Fax: [phone] 05/03/23 4:50 PM  NOTE: This note has been prepared using Dragon dictation software. Despite my best ability to proofread, there is always the potential that unintentional transcriptional errors may still occur from this process.

## 2023-05-09 ENCOUNTER — Encounter: Payer: Self-pay | Admitting: Family Medicine

## 2023-05-09 ENCOUNTER — Encounter
Admission: RE | Disposition: A | Discharge status: Home / Self Care | Payer: Self-pay | Source: Ambulatory Visit | Attending: Family Medicine

## 2023-05-09 ENCOUNTER — Inpatient Hospital Stay
Admission: RE | Admit: 2023-05-09 | Discharge: 2023-05-10 | DRG: 187 | Disposition: A | Discharge status: Home / Self Care | Payer: PPO | Source: Ambulatory Visit | Attending: Obstetrics and Gynecology | Admitting: Obstetrics and Gynecology

## 2023-05-09 ENCOUNTER — Other Ambulatory Visit: Payer: Self-pay

## 2023-05-09 ENCOUNTER — Inpatient Hospital Stay: Payer: PPO

## 2023-05-09 ENCOUNTER — Ambulatory Visit: Payer: PPO

## 2023-05-09 DIAGNOSIS — M810 Age-related osteoporosis without current pathological fracture: Secondary | ICD-10-CM | POA: Diagnosis present

## 2023-05-09 DIAGNOSIS — I2510 Atherosclerotic heart disease of native coronary artery without angina pectoris: Secondary | ICD-10-CM | POA: Diagnosis not present

## 2023-05-09 DIAGNOSIS — I110 Hypertensive heart disease with heart failure: Secondary | ICD-10-CM | POA: Diagnosis present

## 2023-05-09 DIAGNOSIS — Z85118 Personal history of other malignant neoplasm of bronchus and lung: Secondary | ICD-10-CM

## 2023-05-09 DIAGNOSIS — I2699 Other pulmonary embolism without acute cor pulmonale: Secondary | ICD-10-CM

## 2023-05-09 DIAGNOSIS — Z91048 Other nonmedicinal substance allergy status: Secondary | ICD-10-CM

## 2023-05-09 DIAGNOSIS — R590 Localized enlarged lymph nodes: Secondary | ICD-10-CM | POA: Diagnosis present

## 2023-05-09 DIAGNOSIS — Z7901 Long term (current) use of anticoagulants: Secondary | ICD-10-CM | POA: Diagnosis not present

## 2023-05-09 DIAGNOSIS — Z8616 Personal history of COVID-19: Secondary | ICD-10-CM

## 2023-05-09 DIAGNOSIS — Z7984 Long term (current) use of oral hypoglycemic drugs: Secondary | ICD-10-CM | POA: Diagnosis not present

## 2023-05-09 DIAGNOSIS — Z95828 Presence of other vascular implants and grafts: Secondary | ICD-10-CM | POA: Diagnosis not present

## 2023-05-09 DIAGNOSIS — E1165 Type 2 diabetes mellitus with hyperglycemia: Secondary | ICD-10-CM | POA: Diagnosis not present

## 2023-05-09 DIAGNOSIS — I5032 Chronic diastolic (congestive) heart failure: Secondary | ICD-10-CM | POA: Diagnosis present

## 2023-05-09 DIAGNOSIS — Z79899 Other long term (current) drug therapy: Secondary | ICD-10-CM | POA: Diagnosis not present

## 2023-05-09 DIAGNOSIS — I5030 Unspecified diastolic (congestive) heart failure: Secondary | ICD-10-CM | POA: Insufficient documentation

## 2023-05-09 DIAGNOSIS — Z885 Allergy status to narcotic agent status: Secondary | ICD-10-CM

## 2023-05-09 DIAGNOSIS — J90 Pleural effusion, not elsewhere classified: Principal | ICD-10-CM | POA: Diagnosis present

## 2023-05-09 DIAGNOSIS — I700 Atherosclerosis of aorta: Secondary | ICD-10-CM | POA: Diagnosis not present

## 2023-05-09 DIAGNOSIS — E1142 Type 2 diabetes mellitus with diabetic polyneuropathy: Secondary | ICD-10-CM | POA: Diagnosis present

## 2023-05-09 DIAGNOSIS — R079 Chest pain, unspecified: Principal | ICD-10-CM | POA: Diagnosis present

## 2023-05-09 DIAGNOSIS — Z833 Family history of diabetes mellitus: Secondary | ICD-10-CM

## 2023-05-09 DIAGNOSIS — Z803 Family history of malignant neoplasm of breast: Secondary | ICD-10-CM

## 2023-05-09 DIAGNOSIS — E1169 Type 2 diabetes mellitus with other specified complication: Secondary | ICD-10-CM | POA: Diagnosis not present

## 2023-05-09 DIAGNOSIS — Z8249 Family history of ischemic heart disease and other diseases of the circulatory system: Secondary | ICD-10-CM | POA: Diagnosis not present

## 2023-05-09 DIAGNOSIS — R0602 Shortness of breath: Secondary | ICD-10-CM | POA: Diagnosis not present

## 2023-05-09 DIAGNOSIS — G894 Chronic pain syndrome: Secondary | ICD-10-CM | POA: Diagnosis present

## 2023-05-09 DIAGNOSIS — Z923 Personal history of irradiation: Secondary | ICD-10-CM | POA: Diagnosis not present

## 2023-05-09 DIAGNOSIS — C3490 Malignant neoplasm of unspecified part of unspecified bronchus or lung: Secondary | ICD-10-CM | POA: Diagnosis not present

## 2023-05-09 DIAGNOSIS — Z9981 Dependence on supplemental oxygen: Secondary | ICD-10-CM | POA: Diagnosis not present

## 2023-05-09 DIAGNOSIS — Z7722 Contact with and (suspected) exposure to environmental tobacco smoke (acute) (chronic): Secondary | ICD-10-CM | POA: Diagnosis present

## 2023-05-09 DIAGNOSIS — Z9049 Acquired absence of other specified parts of digestive tract: Secondary | ICD-10-CM | POA: Diagnosis not present

## 2023-05-09 DIAGNOSIS — J984 Other disorders of lung: Secondary | ICD-10-CM | POA: Diagnosis not present

## 2023-05-09 DIAGNOSIS — Z86711 Personal history of pulmonary embolism: Secondary | ICD-10-CM

## 2023-05-09 DIAGNOSIS — R918 Other nonspecific abnormal finding of lung field: Secondary | ICD-10-CM | POA: Diagnosis not present

## 2023-05-09 DIAGNOSIS — Z91040 Latex allergy status: Secondary | ICD-10-CM | POA: Diagnosis not present

## 2023-05-09 DIAGNOSIS — E669 Obesity, unspecified: Secondary | ICD-10-CM | POA: Diagnosis present

## 2023-05-09 DIAGNOSIS — Z48813 Encounter for surgical aftercare following surgery on the respiratory system: Secondary | ICD-10-CM | POA: Diagnosis not present

## 2023-05-09 DIAGNOSIS — Z01812 Encounter for preprocedural laboratory examination: Secondary | ICD-10-CM

## 2023-05-09 DIAGNOSIS — Z9889 Other specified postprocedural states: Principal | ICD-10-CM

## 2023-05-09 DIAGNOSIS — Z86718 Personal history of other venous thrombosis and embolism: Secondary | ICD-10-CM

## 2023-05-09 DIAGNOSIS — Z7985 Long-term (current) use of injectable non-insulin antidiabetic drugs: Secondary | ICD-10-CM | POA: Diagnosis not present

## 2023-05-09 DIAGNOSIS — J9811 Atelectasis: Secondary | ICD-10-CM | POA: Diagnosis not present

## 2023-05-09 DIAGNOSIS — Z6836 Body mass index (BMI) 36.0-36.9, adult: Secondary | ICD-10-CM

## 2023-05-09 LAB — CBC WITH DIFFERENTIAL/PLATELET
Abs Immature Granulocytes: 0.02 10*3/uL (ref 0.00–0.07)
Basophils Absolute: 0 10*3/uL (ref 0.0–0.1)
Basophils Relative: 1 %
Eosinophils Absolute: 0.3 10*3/uL (ref 0.0–0.5)
Eosinophils Relative: 4 %
HCT: 38.6 % (ref 36.0–46.0)
Hemoglobin: 12.4 g/dL (ref 12.0–15.0)
Immature Granulocytes: 0 %
Lymphocytes Relative: 15 %
Lymphs Abs: 1 10*3/uL (ref 0.7–4.0)
MCH: 28.8 pg (ref 26.0–34.0)
MCHC: 32.1 g/dL (ref 30.0–36.0)
MCV: 89.8 fL (ref 80.0–100.0)
Monocytes Absolute: 0.7 10*3/uL (ref 0.1–1.0)
Monocytes Relative: 11 %
Neutro Abs: 4.6 10*3/uL (ref 1.7–7.7)
Neutrophils Relative %: 69 %
Platelets: 273 10*3/uL (ref 150–400)
RBC: 4.3 MIL/uL (ref 3.87–5.11)
RDW: 13.9 % (ref 11.5–15.5)
WBC: 6.6 10*3/uL (ref 4.0–10.5)
nRBC: 0 % (ref 0.0–0.2)

## 2023-05-09 LAB — COMPREHENSIVE METABOLIC PANEL
ALT: 14 U/L (ref 0–44)
AST: 16 U/L (ref 15–41)
Albumin: 3.3 g/dL — ABNORMAL LOW (ref 3.5–5.0)
Alkaline Phosphatase: 38 U/L (ref 38–126)
Anion gap: 10 (ref 5–15)
BUN: 9 mg/dL (ref 8–23)
CO2: 23 mmol/L (ref 22–32)
Calcium: 8.6 mg/dL — ABNORMAL LOW (ref 8.9–10.3)
Chloride: 102 mmol/L (ref 98–111)
Creatinine, Ser: 0.69 mg/dL (ref 0.44–1.00)
GFR, Estimated: >60 mL/min (ref >60)
Glucose, Bld: 144 mg/dL — ABNORMAL HIGH (ref 70–99)
Potassium: 3.9 mmol/L (ref 3.5–5.1)
Sodium: 135 mmol/L (ref 135–145)
Total Bilirubin: 0.5 mg/dL (ref <1.2)
Total Protein: 6.9 g/dL (ref 6.5–8.1)

## 2023-05-09 LAB — BRAIN NATRIURETIC PEPTIDE: B Natriuretic Peptide: 19.5 pg/mL (ref 0.0–100.0)

## 2023-05-09 LAB — PROTIME-INR
INR: 1.1 (ref 0.8–1.2)
Prothrombin Time: 14.4 s (ref 11.4–15.2)

## 2023-05-09 LAB — GLUCOSE, CAPILLARY
Glucose-Capillary: 162 mg/dL — ABNORMAL HIGH (ref 70–99)
Glucose-Capillary: 143 mg/dL — ABNORMAL HIGH (ref 70–99)
Glucose-Capillary: 207 mg/dL — ABNORMAL HIGH (ref 70–99)

## 2023-05-09 LAB — D-DIMER, QUANTITATIVE: D-Dimer, Quant: 1.01 ug{FEU}/mL — ABNORMAL HIGH (ref 0.00–0.50)

## 2023-05-09 LAB — TROPONIN I (HIGH SENSITIVITY): Troponin I (High Sensitivity): 3 ng/L (ref <18)

## 2023-05-09 LAB — HEMOGLOBIN A1C
Hgb A1c MFr Bld: 7 % — ABNORMAL HIGH (ref 4.8–5.6)
Mean Plasma Glucose: 154.2 mg/dL

## 2023-05-09 SURGERY — BRONCHOSCOPY, FLEXIBLE
Anesthesia: General

## 2023-05-09 MED ORDER — ENOXAPARIN SODIUM 40 MG/0.4ML IJ SOSY: 40.0000 mg | PREFILLED_SYRINGE | INTRAMUSCULAR | Status: DC

## 2023-05-09 MED ORDER — FUROSEMIDE 10 MG/ML IJ SOLN
20.0000 mg | Freq: Once | INTRAMUSCULAR | Status: AC
  Administered 2023-05-09: 20 mg via INTRAVENOUS
  Filled 2023-05-09: qty 2

## 2023-05-09 MED ORDER — WARFARIN SODIUM 6 MG PO TABS
9.0000 mg | ORAL_TABLET | Freq: Once | ORAL | Status: DC
  Filled 2023-05-09: qty 1

## 2023-05-09 MED ORDER — WARFARIN - PHARMACIST DOSING INPATIENT: Freq: Every day | Status: DC

## 2023-05-09 MED ORDER — SODIUM CHLORIDE 0.9 % IV SOLN
500.0000 mg | INTRAVENOUS | Status: DC
  Administered 2023-05-09: 500 mg via INTRAVENOUS
  Filled 2023-05-09 (×2): qty 5

## 2023-05-09 MED ORDER — ENOXAPARIN SODIUM 60 MG/0.6ML IJ SOSY
0.5000 mg/kg | PREFILLED_SYRINGE | INTRAMUSCULAR | Status: DC
  Filled 2023-05-09: qty 0.6

## 2023-05-09 MED ORDER — HYDROCODONE-ACETAMINOPHEN 5-325 MG PO TABS
1.0000 | ORAL_TABLET | Freq: Once | ORAL | Status: AC
  Administered 2023-05-09: 1 via ORAL
  Filled 2023-05-09: qty 1

## 2023-05-09 MED ORDER — SODIUM CHLORIDE 0.9 % IV SOLN: INTRAVENOUS | Status: DC

## 2023-05-09 MED ORDER — WARFARIN SODIUM 6 MG PO TABS
9.0000 mg | ORAL_TABLET | Freq: Once | ORAL | Status: AC
  Administered 2023-05-09: 9 mg via ORAL
  Filled 2023-05-09: qty 1

## 2023-05-09 MED ORDER — ONDANSETRON HCL 4 MG/2ML IJ SOLN
4.0000 mg | Freq: Four times a day (QID) | INTRAMUSCULAR | Status: DC | PRN
Start: 2023-05-09 — End: 2023-05-10

## 2023-05-09 MED ORDER — ONDANSETRON HCL 4 MG PO TABS: 4.0000 mg | ORAL_TABLET | Freq: Four times a day (QID) | ORAL | Status: DC | PRN

## 2023-05-09 MED ORDER — IOHEXOL 350 MG/ML SOLN
75.0000 mL | Freq: Once | INTRAVENOUS | Status: AC | PRN
  Administered 2023-05-09: 75 mL via INTRAVENOUS

## 2023-05-09 MED ORDER — WARFARIN - PHARMACIST DOSING INPATIENT
Freq: Every day | Status: DC
Start: 2023-05-10 — End: 2023-05-09

## 2023-05-09 MED ORDER — ENOXAPARIN SODIUM 100 MG/ML IJ SOSY
1.0000 mg/kg | PREFILLED_SYRINGE | Freq: Two times a day (BID) | INTRAMUSCULAR | Status: DC
  Administered 2023-05-09 – 2023-05-10 (×2): 90 mg via SUBCUTANEOUS
  Filled 2023-05-09 (×2): qty 1

## 2023-05-09 MED ORDER — FENTANYL CITRATE (PF) 100 MCG/2ML IJ SOLN
INTRAMUSCULAR | Status: AC
  Filled 2023-05-09: qty 2

## 2023-05-09 MED ORDER — ACETAMINOPHEN 10 MG/ML IV SOLN
INTRAVENOUS | Status: AC
  Filled 2023-05-09: qty 100

## 2023-05-09 MED ORDER — INSULIN ASPART 100 UNIT/ML IJ SOLN
0.0000 [IU] | Freq: Three times a day (TID) | INTRAMUSCULAR | Status: DC
  Administered 2023-05-10: 1 [IU] via SUBCUTANEOUS
  Administered 2023-05-10: 3 [IU] via SUBCUTANEOUS
  Filled 2023-05-09 (×2): qty 1

## 2023-05-09 MED ORDER — SODIUM CHLORIDE 0.9 % IV SOLN
2.0000 g | INTRAVENOUS | Status: DC
  Administered 2023-05-09: 2 g via INTRAVENOUS
  Filled 2023-05-09 (×2): qty 20

## 2023-05-09 MED ORDER — PROPOFOL 10 MG/ML IV BOLUS
INTRAVENOUS | Status: AC
  Filled 2023-05-09: qty 20

## 2023-05-09 NOTE — Anesthesia Preprocedure Evaluation (Signed)
Anesthesia Evaluation  Patient identified by MRN, date of birth, ID band Patient awake  General Assessment Comment:  Patient states over the weekend she had chest pain, went to urgent care where she said they recommended going to the ED, which she did not go to. Patient says her chest pain is reproducible with touch, mid chest, and feels like "broken ribs". Denies radiation of pain. EKG will be obtained.  Reviewed: Allergy & Precautions, NPO status , Patient's Chart, lab work & pertinent test results  History of Anesthesia Complications Negative for: history of anesthetic complications  Airway Mallampati: IV   Neck ROM: Full    Dental  (+) Partial Upper   Pulmonary shortness of breath, neg sleep apnea, neg COPD, Patient abstained from smoking.Not current smoker    + decreased breath sounds      Cardiovascular Exercise Tolerance: Good METShypertension, Pt. 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)  (-) CAD and (-) Past MI Normal cardiovascular exam(-) dysrhythmias  Rhythm:Regular Rate:Normal     Neuro/Psych Vertigo   Neuromuscular disease (diabetic neuropathy)  negative psych ROS   GI/Hepatic hiatal hernia,neg GERD  ,,(+)     (-) substance abuse    Endo/Other  diabetes, Type 2  Obesity   Renal/GU negative Renal ROS     Musculoskeletal  (+) Arthritis ,    Abdominal   Peds  Hematology negative hematology ROS (+)   Anesthesia Other Findings Past Medical History: No date: Anginal pain (HCC) No date: Arthritis     Comment:  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, 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 No date: CHF (congestive heart failure) (HCC)     Comment:  followed colon resection, "wouldn't let me get up" No date: Chronic anticoagulation No date: Degenerative disc disease, lumbar 2018: Diverticulitis No date: Diverticulosis 2018: DVT of lower extremity, bilateral (HCC) No date: Essential  hypertension No date: History of being hospitalized     Comment:  1 month ago for 3 days, vomiting, and kink in upper               intestine No date: History of hiatal hernia 04/2023: Mediastinal adenopathy No date: MRSA nasal colonization 2022: Non-small cell lung cancer (HCC)     Comment:  right No date: Obesity (BMI 30-39.9) No date: Osteoarthritis No date: Osteoporosis 2018: PE (pulmonary thromboembolism) (HCC) No date: Pneumonia 03/20/2023: Small bowel obstruction (HCC) No date: 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HCC) No date: Type 2 diabetes mellitus with obesity (HCC) No date: Vertigo  Reproductive/Obstetrics                             Anesthesia Physical Anesthesia Plan  ASA: 3  Anesthesia Plan: General   Post-op Pain Management: Ofirmev IV (intra-op)*   Induction: Intravenous  PONV Risk Score and Plan: 3 and Ondansetron, Dexamethasone and Treatment may vary due to age or medical condition  Airway Management Planned: Oral ETT and Video Laryngoscope Planned  Additional Equipment: None  Intra-op Plan:   Post-operative Plan: Extubation in OR  Informed Consent: I have reviewed the patients History and Physical, chart, labs and discussed the procedure including the risks, benefits and alternatives for the proposed anesthesia with the patient or authorized representative who has indicated his/her understanding and acceptance.     Dental advisory given  Plan Discussed with: CRNA  Anesthesia Plan Comments: (Discussed risks of anesthesia with patient, including PONV, sore throat, lip/dental/eye damage. Rare risks  discussed as well, such as cardiorespiratory and neurological sequelae, and allergic reactions. Discussed the role of CRNA in patient's perioperative care. Patient understands.)        Anesthesia Quick Evaluation

## 2023-05-09 NOTE — Assessment & Plan Note (Signed)
Baseline lung cancer status post radiation therapy Followed by Dr. Smith Robert outpatient Noted PET/CT November 2024 with concern for metastatic disease Pending ultrasound-guided biopsy in the preop area today Will defer for now in setting of active chest pain Follow-up recommendations from Dr. Jayme Cloud Monitor

## 2023-05-09 NOTE — Consult Note (Addendum)
PHARMACY - ANTICOAGULATION CONSULT NOTE  Pharmacy Consult for Warfarin  Indication: History of PE  Allergies  Allergen Reactions   Adhesive [Tape] Other (See Comments)    Pt reports, when removing tape from skin most tapes pull her skin off. Ok to use paper tape   Other Hives    Chlorhexadine wipes/CHG wipes,    Latex Rash    Exam gloves/ does not react around elastic and lips don't swell when blowing balloons   Morphine And Codeine Nausea And Vomiting   Patient Measurements: Height: 5\' 3"  (160 cm) Weight: 91.2 kg (201 lb) IBW/kg (Calculated) : 52.4  Vital Signs: Temp: 97.6 F (36.4 C) (12/16 1106) Temp Source: Temporal (12/16 1106) BP: 142/63 (12/16 1106) Pulse Rate: 101 (12/16 1106)  Labs: Recent Labs    05/09/23 1252  HGB 12.4  HCT 38.6  PLT 273  LABPROT 14.4  INR 1.1  CREATININE 0.69  TROPONINIHS 3   Estimated Creatinine Clearance (by C-G formula based on SCr of 0.69 mg/dL) Female: 46.9 mL/min Female: 84.6 mL/min  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, 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   CHF (congestive heart failure) (HCC)    followed colon resection, "wouldn't let me get up"   Chronic anticoagulation    Degenerative disc disease, lumbar    Diverticulitis 2018   Diverticulosis    DVT of lower extremity, bilateral (HCC) 2018   Essential hypertension    History of being hospitalized    1 month ago for 3 days, vomiting, and kink in upper intestine   History of hiatal hernia    Mediastinal adenopathy 04/2023   MRSA nasal colonization    Non-small cell lung cancer (HCC) 2022   right   Obesity (BMI 30-39.9)    Osteoarthritis    Osteoporosis    PE (pulmonary thromboembolism) (HCC) 2018   Pneumonia    Small bowel obstruction (HCC) 03/20/2023   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HCC)    Type 2 diabetes mellitus with obesity (HCC)    Vertigo    Medications:  PTA warfarin 6 mg  daily   DDI: None with current orders   Assessment: Kara [name] is a 72 year old female that presented with non-specific chest pain she has been experiencing for the past 3-4 days in the setting of baseline HFpEF, history of PE (2019) on warfarin, and history of lung cancer status post radiation. From chart review, patient's home regimen appears to be warfarin 6 mg daily. Pharmacy has been consulted for management of warfarin.   Goal of Therapy:  INR 2-3 Monitor platelets by anticoagulation protocol: Yes   Plan:  INR subtherapeutic at 1.1 today  Give warfarin 9 mg x 1 tonight  Per MD will bridge with Lovenox 1 mg/kg q12h until INR has been therapeutic x 2 (24 hours apart)  Monitor INR and CBC daily   Littie Deeds, PharmD Pharmacy Resident  05/09/2023 3:43 PM

## 2023-05-09 NOTE — Progress Notes (Signed)
Pt here. When I asked her about having any children to contact she started crying and said her only son just died in 08/27/22 and how hard it's been.

## 2023-05-09 NOTE — Assessment & Plan Note (Addendum)
Pt with nonspecific chest pain with past 3 to 4 days in the setting of baseline HFpEF, history of PE on coumadin, history of lung cancer status post radiation May have pleuritic component given CP w/ deep breathing at times Was pending ultrasound-guided bronchoscopy in setting of hx/o lung cancer s/p radiation and abnormal PET imaging -in preop area Fairly broad differential diagnosis including pulmonary and cardiac etiologies Stat troponin, D-dimer ordered Chest x-ray does appear to have significant amount of volume overload BNP is pending Will otherwise plan for pain control in the interim Full dose aspirin 2D echo ordered Cardiology consultation as appropriate Monitor

## 2023-05-09 NOTE — Assessment & Plan Note (Signed)
Cont neurontin

## 2023-05-09 NOTE — Progress Notes (Signed)
PHARMACIST - PHYSICIAN COMMUNICATION  CONCERNING:  Enoxaparin (Lovenox) for DVT Prophylaxis    RECOMMENDATION: Patient was prescribed enoxaprin 40mg  q24 hours for VTE prophylaxis.   Filed Weights   05/09/23 1106  Weight: 91.2 kg (201 lb)    Body mass index is 35.61 kg/m.  CrCl cannot be calculated (Patient's most recent lab result is older than the maximum 21 days allowed.).   Based on [facility] policy patient is candidate for enoxaparin 0.5mg /kg TBW SQ every 24 hours based on BMI being >30.  DESCRIPTION: Pharmacy has adjusted enoxaparin dose per [facility] policy.  Patient is now receiving enoxaparin 45 mg every 24 hours    Merryl Hacker, PharmD Clinical Pharmacist  05/09/2023 1:10 PM

## 2023-05-09 NOTE — Assessment & Plan Note (Signed)
2D echo June 2018 with normal EF and diastolic reported function Appears fairly euvolemic to mildly volume overloaded Will give low-dose Lasix x 1 Otherwise continue home regimen Cardiology consult as appropriate pending imaging and labs Monitor

## 2023-05-09 NOTE — Assessment & Plan Note (Signed)
 Blood sugar in 160s SSI  Monitor

## 2023-05-09 NOTE — Assessment & Plan Note (Addendum)
Noted to be on coumadin chronically in setting of hx/o PE as well as prior hx/o lung cancer  Coumadin on hold af of 12/7 preoperatively INR pending  Coumadin per pharmacy  Follow

## 2023-05-09 NOTE — H&P (Addendum)
History and Physical    Patient: Kara [name] XBM:[id number] DOB: 1950/08/18 DOA: 05/09/2023 DOS: the patient was seen and examined on 05/09/2023 PCP: Barbette Reichmann, MD  Patient coming from:  Preop area   Chief Complaint: No chief complaint on file.  HPI: Kara [name] is a 72 y.o. adult with medical history significant of PMH of HTN, umbilical hernia, diabetes mellitus type 2, hypertension, history of PE on coumadin, arthritis, chronic HFpEF,  lung cancer s/p radiation presenting with chest pain.  Patient reports nonspecific chest pain with past 3 to 4 days.  Noted to be pending ultrasound-guided bronchoscopy from recent abnormal PET imaging after follow-up for lung cancer status post radiation treatment.  Patient reports having some chest pain over the weekend.  Was evaluated at urgent care with recommendation for patient go to the ER.  Patient declined.  Is had persistent mild to moderate central chest pain.  Pain worse with deep breathing and sometimes with movement.  Mild nausea.  No diaphoresis.  No focal hemiparesis or confusion.  Mild orthopnea as well as lower extremity swelling.  No fevers or chills.  Non-smoker.  No reported alcohol use.  Reports compliant with with overall medication regimen.  Noted to have recurring chest pain prior to evaluation today. Coumadin on hold af of 12/7 preoperatively. Presented to preop clinic with Tmax 97.6, heart rate 101, respirations 18, blood pressure 140s over 60s.  Satting well on room air.  Labs including BNP, D-dimer, troponin, metabolic panel, CBC are pending.  Preliminary chest x-ray with fair amount of cardiomegaly and pulmonary edema. EKG pending.  Review of Systems: As mentioned in the history of present illness. All other systems reviewed and are negative. 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, 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   CHF (congestive heart failure)  (HCC)    followed colon resection, "wouldn't let me get up"   Chronic anticoagulation    Degenerative disc disease, lumbar    Diverticulitis 2018   Diverticulosis    DVT of lower extremity, bilateral (HCC) 2018   Essential hypertension    History of being hospitalized    1 month ago for 3 days, vomiting, and kink in upper intestine   History of hiatal hernia    Mediastinal adenopathy 04/2023   MRSA nasal colonization    Non-small cell lung cancer (HCC) 2022   right   Obesity (BMI 30-39.9)    Osteoarthritis    Osteoporosis    PE (pulmonary thromboembolism) (HCC) 2018   Pneumonia    Small bowel obstruction (HCC) 03/20/2023   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HCC)    Type 2 diabetes mellitus with obesity (HCC)    Vertigo    Past Surgical History:  Procedure Laterality Date   APPENDECTOMY N/A 02/24/2017   Procedure: Incidental  APPENDECTOMY;  Surgeon: Ricarda Frame, MD;  Location: [name];  Service: General;  Laterality: N/A;   CARPAL TUNNEL RELEASE Bilateral    CATARACT EXTRACTION W/PHACO Left 11/21/2018   Procedure: CATARACT EXTRACTION PHACO AND INTRAOCULAR LENS PLACEMENT (IOC) LEFT DIABETES;  Surgeon: Galen Manila, MD;  Location: [facility];  Service: Ophthalmology;  Laterality: Left;  latex sensitivity Diabetic - oral meds   CATARACT EXTRACTION W/PHACO Right 12/12/2018   Procedure: CATARACT EXTRACTION PHACO AND INTRAOCULAR LENS PLACEMENT (IOC) RIGHT DIABETES;  Surgeon: Galen Manila, MD;  Location: [facility];  Service: Ophthalmology;  Laterality: Right;  Diabetes-oral  med Latex sensitiviy   COLON RESECTION SIGMOID N/A 11/02/2016   Procedure: COLON RESECTION SIGMOID;  Surgeon: Ricarda Frame, MD;  Location: [name];  Service: General;  Laterality: N/A;   COLON SURGERY  11/02/2016   COLONOSCOPY WITH PROPOFOL N/A 02/03/2017   Procedure: COLONOSCOPY WITH PROPOFOL;  Surgeon: Toney Reil, MD;  Location: [facility];  Service:  Gastroenterology;  Laterality: N/A;   COLOSTOMY N/A 11/02/2016   Procedure: COLOSTOMY;  Surgeon: Ricarda Frame, MD;  Location: [name];  Service: General;  Laterality: N/A;   COLOSTOMY REVERSAL N/A 02/24/2017   Procedure: COLOSTOMY REVERSAL, ostomy takedown, spleenic flexure mobilization, excision rectal stump/distal sigmoid, anastomosis with suture reinforcement;  Surgeon: Ricarda Frame, MD;  Location: [name];  Service: General;  Laterality: N/A;   ILEO LOOP DIVERSION N/A 02/24/2017   Procedure: ILEO LOOP COLOSTOMY;  Surgeon: Ricarda Frame, MD;  Location: [name];  Service: General;  Laterality: N/A;   ILEOSTOMY CLOSURE N/A 06/01/2017   Procedure: LOOP ILEOSTOMY TAKEDOWN;  Surgeon: Ricarda Frame, MD;  Location: [name];  Service: General;  Laterality: N/A;   IVC FILTER INSERTION Right 10/2016   IVC FILTER REMOVAL N/A 08/09/2017   Procedure: IVC FILTER REMOVAL;  Surgeon: Renford Dills, MD;  Location: [name];  Service: Cardiovascular;  Laterality: N/A;   KNEE SURGERY Right    torn menicus   LAPAROSCOPY N/A 02/24/2017   Procedure: LAPAROSCOPY DIAGNOSTIC;  Surgeon: Ricarda Frame, MD;  Location: [name];  Service: General;  Laterality: N/A;   LYSIS OF ADHESION N/A 02/24/2017   Procedure: LYSIS OF ADHESION;  Surgeon: Ricarda Frame, MD;  Location: [name];  Service: General;  Laterality: N/A;   PULMONARY VENOGRAPHY N/A 11/19/2016   Procedure: Pulmonary Venography; IVC filter placement; possible pulmonary thrombectomy;  Surgeon: Renford Dills, MD;  Location: [name];  Service: Cardiovascular;  Laterality: N/A;   SACROPLASTY N/A 11/08/2017   Procedure: SACROPLASTY S2;  Surgeon: Kennedy Bucker, MD;  Location: [name];  Service: Orthopedics;  Laterality: N/A;   SIGMOIDOSCOPY N/A 11/13/2016   Procedure: endoscopic  flexible SIGMOIDOSCOPY;  Surgeon: Lattie Haw, MD;  Location: [name];  Service: General;  Laterality: N/A;    SIGMOIDOSCOPY N/A 05/19/2017   Procedure: Arnell Sieving;  Surgeon: Ricarda Frame, MD;  Location: [name];  Service: General;  Laterality: N/A;   VIDEO BRONCHOSCOPY WITH ENDOBRONCHIAL NAVIGATION N/A 02/23/2021   Procedure: ROBOTIC VIDEO BRONCHOSCOPY WITH ENDOBRONCHIAL NAVIGATION;  Surgeon: [name], MD;  Location: [name];  Service: Pulmonary;  Laterality: N/A;   Social History:  reports that she has never smoked. She has been exposed to tobacco smoke. She has never used smokeless tobacco. She reports that she does not drink alcohol and does not use drugs.  Allergies  Allergen Reactions   Adhesive [Tape] Other (See Comments)    Pt reports, when removing tape from skin most tapes pull her skin off. Ok to use paper tape   Other Hives    Chlorhexadine wipes/CHG wipes,    Latex Rash    Exam gloves/ does not react around elastic and lips don't swell when blowing balloons   Morphine And Codeine Nausea And Vomiting    Family History  Problem Relation Age of Onset   Diabetes Mother    Heart disease Mother    Cancer Mother    Breast cancer Mother        >50   Heart disease Father    Diabetes Sister    Heart disease Sister  Prior to Admission medications   Medication Sig Start Date End Date Taking? Authorizing Provider  candesartan (ATACAND) 16 MG tablet Take 16 mg by mouth in the morning. 08/03/17  Yes [provider]  cholecalciferol (VITAMIN D) 25 MCG (1000 UNIT) tablet Take 1,000 Units by mouth in the morning.   Yes [provider]  Cyanocobalamin (VITAMIN B-12) 5000 MCG TBDP Take 5,000 mcg by mouth in the morning.   Yes [provider]  cyclobenzaprine (FLEXERIL) 10 MG tablet Take 10 mg by mouth at bedtime. 07/05/16  Yes [provider]  Dulaglutide 1.5 MG/0.5ML SOPN Inject 1.5 mg into the skin every Saturday.   Yes [provider]  gabapentin (NEURONTIN) 100 MG capsule Take 200 mg by mouth with breakfast, with  lunch, and with evening meal.   Yes [provider]  gabapentin (NEURONTIN) 300 MG capsule Take 300 mg by mouth at bedtime.   Yes [provider]  glipiZIDE (GLUCOTROL) 10 MG tablet Take 10 mg by mouth daily before breakfast.   Yes [provider]  HYDROcodone-acetaminophen (NORCO/VICODIN) 5-325 MG tablet Take 1 tablet by mouth every 6 (six) hours as needed for moderate pain.   Yes [provider]  Magnesium Oxide -Mg Supplement 500 MG TABS Take 1 tablet by mouth at bedtime.   Yes [provider]  meclizine (ANTIVERT) 25 MG tablet Take 25 mg by mouth 3 (three) times daily as needed for dizziness. 12/29/17  Yes [provider]  oxybutynin (DITROPAN) 5 MG tablet Take 1 tablet by mouth 2 (two) times daily. 02/25/22  Yes [provider]  rOPINIRole (REQUIP) 0.5 MG tablet Take 0.5 mg by mouth at bedtime.   Yes [provider]  warfarin (COUMADIN) 6 MG tablet Take 6 mg by mouth daily.   Yes [provider]  calcium carbonate (CALCIUM 600) 600 MG TABS tablet Take 1 tablet (600 mg total) by mouth 2 (two) times daily with a meal. 02/25/20 05/02/23  Delano Metz, MD    Physical Exam: Vitals:   05/09/23 1106  BP: (!) 142/63  Pulse: (!) 101  Resp: 18  Temp: 97.6 F (36.4 C)  TempSrc: Temporal  SpO2: 95%  Weight: 91.2 kg  Height: 5\' 3"  (1.6 m)   Physical Exam Constitutional:      Appearance: She is obese.  HENT:     Head: Normocephalic and atraumatic.     Mouth/Throat:     Mouth: Mucous membranes are moist.  Eyes:     Pupils: Pupils are equal, round, and reactive to light.  Cardiovascular:     Rate and Rhythm: Normal rate and regular rhythm.  Pulmonary:     Effort: Pulmonary effort is normal.  Abdominal:     General: Bowel sounds are normal.  Musculoskeletal:     Right lower leg: Edema present.     Left lower leg: Edema present.  Neurological:     General: No focal deficit present.  Psychiatric:         Mood and Affect: Mood normal.     Data Reviewed:  There are no new results to review at this time.   Lab Results  Component Value Date   WBC 6.6 05/09/2023   HGB 12.4 05/09/2023   HCT 38.6 05/09/2023   MCV 89.8 05/09/2023   PLT 273 05/09/2023   Last metabolic panel Lab Results  Component Value Date   GLUCOSE 144 (H) 05/09/2023   NA 135 05/09/2023   K 3.9 05/09/2023   CL  102 05/09/2023   CO2 23 05/09/2023   BUN 9 05/09/2023   CREATININE 0.69 05/09/2023   GFRNONAA >60 05/09/2023   CALCIUM 8.6 (L) 05/09/2023   PHOS 4.0 03/22/2023   PROT 6.9 05/09/2023   ALBUMIN 3.3 (L) 05/09/2023   BILITOT 0.5 05/09/2023   ALKPHOS 38 05/09/2023   AST 16 05/09/2023   ALT 14 05/09/2023   ANIONGAP 10 05/09/2023    Assessment and Plan: * Chest pain Pt with nonspecific chest pain with past 3 to 4 days in the setting of baseline HFpEF, history of PE on coumadin, history of lung cancer status post radiation May have pleuritic component given CP w/ deep breathing at times Was pending ultrasound-guided bronchoscopy in setting of hx/o lung cancer s/p radiation and abnormal PET imaging -in preop area Fairly broad differential diagnosis including pulmonary and cardiac etiologies Stat troponin, D-dimer ordered Chest x-ray does appear to have significant amount of volume overload BNP is pending Will otherwise plan for pain control in the interim Full dose aspirin 2D echo ordered Cardiology consultation as appropriate Monitor  Diabetic peripheral neuropathy (HCC) Cont neurontin    Chronic anticoagulation (COUMADIN) Noted to be on coumadin chronically in setting of hx/o PE as well as prior hx/o lung cancer  Coumadin on hold af of 12/7 preoperatively INR pending  Coumadin per pharmacy  Follow   Lung cancer [facility]) Baseline lung cancer status post radiation therapy Followed by Dr. Smith Robert outpatient Noted PET/CT November 2024 with concern for metastatic disease Pending ultrasound-guided  biopsy in the preop area today Will defer for now in setting of active chest pain Follow-up recommendations from Dr. Jayme Cloud Monitor  (HFpEF) heart failure with preserved ejection fraction [facility]) 2D echo June 2018 with normal EF and diastolic reported function Appears fairly euvolemic to mildly volume overloaded Will give low-dose Lasix x 1 Otherwise continue home regimen Cardiology consult as appropriate pending imaging and labs Monitor  Type 2 diabetes mellitus with hyperglycemia, without long-term current use of insulin (HCC) Blood sugar in 160s  SSI  Monitor       Advance Care Planning:   Code Status: Full Code   Consults: Dr. Greggory Keen 2w/ pulmonology   Family Communication: No family at the bedside   Severity of Illness: The appropriate patient status for this patient is INPATIENT. Inpatient status is judged to be reasonable and necessary in order to provide the required intensity of service to ensure the patient's safety. The patient's presenting symptoms, physical exam findings, and initial radiographic and laboratory data in the context of their chronic comorbidities is felt to place them at high risk for further clinical deterioration. Furthermore, it is not anticipated that the patient will be medically stable for discharge from the hospital within 2 midnights of admission.   * I certify that at the point of admission it is my clinical judgment that the patient will require inpatient hospital care spanning beyond 2 midnights from the point of admission due to high intensity of service, high risk for further deterioration and high frequency of surveillance required.*  Author: Floydene Flock, MD 05/09/2023 1:23 PM  For on call review [email or web address].

## 2023-05-09 NOTE — Interval H&P Note (Addendum)
Kara [name] has presented today for surgery, with the diagnosis of MEDIASTINAL ADENOPATHY.  The various methods of treatment have been discussed with the patient and family. After consideration of risks, benefits and other options for treatment, the patient has consented to  Procedure(s): BRONCHOSCOPY WITH ENDOBRONCHIAL ULTRASOUND AND TBNA- bilat.  as a surgical intervention.  The patient's history has been reviewed, patient examined, no change in status, stable for surgery.  I have reviewed the patient's chart and labs.  Questions were answered to the patient's satisfaction.  During evaluation the patient endorsed having CP which started this week end. Was advised to go to ED by UC staff but declined. She is more tachypneic than baseline. CXR shows PNA vs CHF. EKG with ? new inferior changes. Cardiologist is [facility] cardiology. Will cancel case and have hospitalist eval. patient.  Gailen Shelter, MD Advanced Bronchoscopy PCCM [facility]    *This note was generated using voice recognition software/Dragon and/or AI transcription program.  Despite best efforts to proofread, errors can occur which can change the meaning. Any transcriptional errors that result from this process are unintentional and may not be fully corrected at the time of dictation.

## 2023-05-10 ENCOUNTER — Inpatient Hospital Stay: Payer: PPO

## 2023-05-10 DIAGNOSIS — R079 Chest pain, unspecified: Secondary | ICD-10-CM | POA: Diagnosis not present

## 2023-05-10 LAB — GLUCOSE, CAPILLARY
Glucose-Capillary: 144 mg/dL — ABNORMAL HIGH (ref 70–99)
Glucose-Capillary: 215 mg/dL — ABNORMAL HIGH (ref 70–99)
Glucose-Capillary: 144 mg/dL — ABNORMAL HIGH (ref 70–99)

## 2023-05-10 LAB — BODY FLUID CELL COUNT WITH DIFFERENTIAL
Eos, Fluid: 41 %
Lymphs, Fluid: 30 %
Monocyte-Macrophage-Serous Fluid: 25 %
Neutrophil Count, Fluid: 3 %
Other Cells, Fluid: 1 %
Total Nucleated Cell Count, Fluid: 1727 uL

## 2023-05-10 LAB — COMPREHENSIVE METABOLIC PANEL
ALT: 14 U/L (ref 0–44)
AST: 16 U/L (ref 15–41)
Albumin: 3.2 g/dL — ABNORMAL LOW (ref 3.5–5.0)
Alkaline Phosphatase: 38 U/L (ref 38–126)
Anion gap: 9 (ref 5–15)
BUN: 15 mg/dL (ref 8–23)
CO2: 25 mmol/L (ref 22–32)
Calcium: 8.3 mg/dL — ABNORMAL LOW (ref 8.9–10.3)
Chloride: 101 mmol/L (ref 98–111)
Creatinine, Ser: 0.74 mg/dL (ref 0.44–1.00)
GFR, Estimated: >60 mL/min (ref >60)
Glucose, Bld: 139 mg/dL — ABNORMAL HIGH (ref 70–99)
Potassium: 3.5 mmol/L (ref 3.5–5.1)
Sodium: 135 mmol/L (ref 135–145)
Total Bilirubin: 0.5 mg/dL (ref <1.2)
Total Protein: 7.1 g/dL (ref 6.5–8.1)

## 2023-05-10 LAB — GLUCOSE, PLEURAL OR PERITONEAL FLUID: Glucose, Fluid: 209 mg/dL

## 2023-05-10 LAB — CBC
HCT: 38.1 % (ref 36.0–46.0)
Hemoglobin: 12.4 g/dL (ref 12.0–15.0)
MCH: 28.6 pg (ref 26.0–34.0)
MCHC: 32.5 g/dL (ref 30.0–36.0)
MCV: 88 fL (ref 80.0–100.0)
Platelets: 314 10*3/uL (ref 150–400)
RBC: 4.33 MIL/uL (ref 3.87–5.11)
RDW: 13.9 % (ref 11.5–15.5)
WBC: 6 10*3/uL (ref 4.0–10.5)
nRBC: 0 % (ref 0.0–0.2)

## 2023-05-10 LAB — LACTATE DEHYDROGENASE, PLEURAL OR PERITONEAL FLUID: LD, Fluid: 990 U/L — ABNORMAL HIGH (ref 3–23)

## 2023-05-10 LAB — PROTIME-INR
INR: 1.1 (ref 0.8–1.2)
Prothrombin Time: 14.9 s (ref 11.4–15.2)

## 2023-05-10 LAB — HIV ANTIBODY (ROUTINE TESTING W REFLEX): HIV Screen 4th Generation wRfx: NONREACTIVE

## 2023-05-10 LAB — LACTATE DEHYDROGENASE: LDH: 151 U/L (ref 98–192)

## 2023-05-10 LAB — PROTEIN, PLEURAL OR PERITONEAL FLUID: Total protein, fluid: 4.7 g/dL

## 2023-05-10 MED ORDER — LIDOCAINE HCL (PF) 1 % IJ SOLN
10.0000 mL | Freq: Once | INTRAMUSCULAR | Status: AC
  Administered 2023-05-10: 10 mL via INTRADERMAL
  Filled 2023-05-10: qty 10

## 2023-05-10 MED ORDER — ACETAMINOPHEN 325 MG PO TABS
650.0000 mg | ORAL_TABLET | Freq: Once | ORAL | Status: AC
  Administered 2023-05-10: 650 mg via ORAL
  Filled 2023-05-10: qty 2

## 2023-05-10 MED ORDER — WARFARIN SODIUM 6 MG PO TABS
9.0000 mg | ORAL_TABLET | Freq: Once | ORAL | Status: DC
  Filled 2023-05-10: qty 1

## 2023-05-10 NOTE — Consult Note (Signed)
PHARMACY - ANTICOAGULATION CONSULT NOTE  Pharmacy Consult for Warfarin  Indication: History of PE  Allergies  Allergen Reactions   Adhesive [Tape] Other (See Comments)    Pt reports, when removing tape from skin most tapes pull her skin off. Ok to use paper tape   Other Hives    Chlorhexadine wipes/CHG wipes,    Latex Rash    Exam gloves/ does not react around elastic and lips don't swell when blowing balloons   Morphine And Codeine Nausea And Vomiting   Patient Measurements: Height: 5\' 3"  (160 cm) Weight: 91.2 kg (201 lb) IBW/kg (Calculated) : 52.4  Vital Signs: Temp: 98.1 F (36.7 C) (12/17 0836) BP: 132/69 (12/17 0836) Pulse Rate: 91 (12/17 0836)  Labs: Recent Labs    05/09/23 1252 05/10/23 0426  HGB 12.4 12.4  HCT 38.6 38.1  PLT 273 314  LABPROT 14.4 14.9  INR 1.1 1.1  CREATININE 0.69 0.74  TROPONINIHS 3  --    Estimated Creatinine Clearance (by C-G formula based on SCr of 0.74 mg/dL) Female: 66.4 mL/min Female: 84.6 mL/min  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, 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   CHF (congestive heart failure) (HCC)    followed colon resection, "wouldn't let me get up"   Chronic anticoagulation    Degenerative disc disease, lumbar    Diverticulitis 2018   Diverticulosis    DVT of lower extremity, bilateral (HCC) 2018   Essential hypertension    History of being hospitalized    1 month ago for 3 days, vomiting, and kink in upper intestine   History of hiatal hernia    Mediastinal adenopathy 04/2023   MRSA nasal colonization    Non-small cell lung cancer (HCC) 2022   right   Obesity (BMI 30-39.9)    Osteoarthritis    Osteoporosis    PE (pulmonary thromboembolism) (HCC) 2018   Pneumonia    Small bowel obstruction (HCC) 03/20/2023   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HCC)    Type 2 diabetes mellitus with obesity (HCC)    Vertigo    Medications:  PTA  warfarin 6 mg daily   DDI: None with current orders   Assessment: Kara [name] is a 72 year old female that presented with non-specific chest pain she has been experiencing for the past 3-4 days in the setting of baseline HFpEF, history of PE (2019) on warfarin, and history of lung cancer status post radiation. From chart review, patient's home regimen appears to be warfarin 6 mg daily , managed by [facility] ANTI-COAG . Pharmacy has been consulted for management of warfarin while inpatient.   INR remains subtherapeutic after 1 dose of warfarin given. MD wants patient bridge with enoxaparin until INR >/= 2. No need for INR > 2 x 2, as patient not new to warfarin and not currently treating. Patient was not bridging prior to admission per chart review.  Goal of Therapy:  INR 2-3 Monitor platelets by anticoagulation protocol: Yes   Date INR Warfarin dose Comments  12/16 1.1 9 mg Holding warfarin prior to procedure, resume therapy today. Bridging with enoxaparin per MD orders  12/17 1.1                     Plan:  Warfarin 9mg  (boost dose) x 1 dose today  Continue Lovenox 1 mg/kg q12h until INR has been  therapeutic  Monitor INR daily and CBC at least weekly.  Chenelle Benning Rodriguez-Guzman PharmD, BCPS 05/10/2023 10:13 AM

## 2023-05-10 NOTE — Procedures (Signed)
PROCEDURE SUMMARY:  Successful image-guided diagnostic and therapeutic right thoracentesis. Yielded 700 cc of dark red pleural fluid. Patient tolerated procedure well. EBL: Zero No immediate complications.  Specimen was sent for labs. Post procedure CXR shows no pneumothorax.  Please see imaging section of Epic for full dictation.  Sable Feil PA-C 05/10/2023 3:53 PM

## 2023-05-10 NOTE — Discharge Summary (Signed)
Kara [name]:[id number] DOB: Apr 17, 1951 DOA: 05/09/2023  PCP: Barbette Reichmann, MD  Admit date: 05/09/2023 Discharge date: 05/10/2023  Time spent: 35 minutes  Recommendations for Outpatient Follow-up:  Pcp f/u Bronchoscopy 12/23    Discharge Diagnoses:  Principal Problem:   Chest pain Active Problems:   Diabetic peripheral neuropathy (HCC)   Chronic anticoagulation (COUMADIN)   Type 2 diabetes mellitus with hyperglycemia, without long-term current use of insulin (HCC)   (HFpEF) heart failure with preserved ejection fraction (HCC)   Lung cancer (HCC)   Discharge Condition: stable  Diet recommendation: heart healthy  Filed Weights   05/09/23 1106  Weight: 91.2 kg    History of present illness:  From admission h and p Kara [name] is a 72 y.o. adult with medical history significant of PMH of HTN, umbilical hernia, diabetes mellitus type 2, hypertension, history of PE on coumadin, arthritis, chronic HFpEF,  lung cancer s/p radiation presenting with chest pain.  Patient reports nonspecific chest pain with past 3 to 4 days.  Noted to be pending ultrasound-guided bronchoscopy from recent abnormal PET imaging after follow-up for lung cancer status post radiation treatment.  Patient reports having some chest pain over the weekend.  Was evaluated at urgent care with recommendation for patient go to the ER.  Patient declined.  Is had persistent mild to moderate central chest pain.  Pain worse with deep breathing and sometimes with movement.  Mild nausea.  No diaphoresis.  No focal hemiparesis or confusion.  Mild orthopnea as well as lower extremity swelling.  No fevers or chills.  Non-smoker.  No reported alcohol use.  Reports compliant with with overall medication regimen.  Noted to have recurring chest pain prior to evaluation today. Coumadin on hold af of 12/7 preoperatively.   Hospital Course:  Patient presented for outpatient EBUS to evaluate lund mass  concerning for malignancy. She complained of chest pain and so was sent to the hospital for evaluation. Here EKG no signs ischemia, normal troponin, normal bnp, CTA no PE but does show worsening right sided pleural effusion. Pulmonology (Dr. Jayme Cloud) consulted who advised thoracentesis which was performed yielding 700 cc dark red fluid which was sent for fluid analysis. Final read of post-procedure CXR pending but per IR team no signs of complication. Discussed with Dr. Alveta Heimlich, patient stable for discharge, plan will be to continue to hold home coumadin and to f/u on 12/23 for re-scheduled EBUS with biopsies. Dr. Jayme Cloud to follow up fluid analysis results. Patient's chest pain is resolved, no respiratory difficulty or hypoxia, ambulating without assistance, thus suitable for discharge and patient is in agreement.   Procedures: Thoracentesis   Consultations: IR, pulmonology  Discharge Exam: Vitals:   05/10/23 1208 05/10/23 1549  BP: 137/76 (!) 119/44  Pulse: 95   Resp: 17 17  Temp: 98.2 F (36.8 C)   SpO2: 95% 93%    General: NAD Cardiovascular: RRR Respiratory: Rales at bases, decreased breath sounds on the right, bandage over thoracentesis site c/d/i  Discharge Instructions   Discharge Instructions     Diet - low sodium heart healthy   Complete by: As directed    Increase activity slowly   Complete by: As directed       Allergies as of 05/10/2023       Reactions   Adhesive [tape] Other (See Comments)   Pt reports, when removing tape from skin most tapes pull her skin off. Ok to use paper tape   Other Hives   Chlorhexadine wipes/CHG  wipes,    Latex Rash   Exam gloves/ does not react around elastic and lips don't swell when blowing balloons   Morphine And Codeine Nausea And Vomiting        Medication List     STOP taking these medications    warfarin 6 MG tablet Commonly known as: COUMADIN       TAKE these medications    calcium carbonate 600 MG Tabs  tablet Commonly known as: Calcium 600 Take 1 tablet (600 mg total) by mouth 2 (two) times daily with a meal.   candesartan 16 MG tablet Commonly known as: ATACAND Take 16 mg by mouth in the morning.   cholecalciferol 25 MCG (1000 UNIT) tablet Commonly known as: VITAMIN D3 Take 1,000 Units by mouth in the morning.   cyclobenzaprine 10 MG tablet Commonly known as: FLEXERIL Take 10 mg by mouth at bedtime.   Dulaglutide 1.5 MG/0.5ML Soaj Inject 1.5 mg into the skin every Saturday.   gabapentin 100 MG capsule Commonly known as: NEURONTIN Take 200 mg by mouth with breakfast, with lunch, and with evening meal.   gabapentin 300 MG capsule Commonly known as: NEURONTIN Take 300 mg by mouth at bedtime.   glipiZIDE 10 MG tablet Commonly known as: GLUCOTROL Take 10 mg by mouth daily before breakfast.   HYDROcodone-acetaminophen 5-325 MG tablet Commonly known as: NORCO/VICODIN Take 1 tablet by mouth every 6 (six) hours as needed for moderate pain.   Magnesium Oxide -Mg Supplement 500 MG Tabs Take 1 tablet by mouth at bedtime.   meclizine 25 MG tablet Commonly known as: ANTIVERT Take 25 mg by mouth 3 (three) times daily as needed for dizziness.   oxybutynin 5 MG tablet Commonly known as: DITROPAN Take 1 tablet by mouth 2 (two) times daily.   rOPINIRole 0.5 MG tablet Commonly known as: REQUIP Take 0.5 mg by mouth at bedtime.   Vitamin B-12 5000 MCG Tbdp Take 5,000 mcg by mouth in the morning.       Allergies  Allergen Reactions   Adhesive [Tape] Other (See Comments)    Pt reports, when removing tape from skin most tapes pull her skin off. Ok to use paper tape   Other Hives    Chlorhexadine wipes/CHG wipes,    Latex Rash    Exam gloves/ does not react around elastic and lips don't swell when blowing balloons   Morphine And Codeine Nausea And Vomiting    Follow-up Information     Barbette Reichmann, MD Follow up.   Specialty: Internal Medicine Contact  information: [address] [place] [address] [phone]         [name], MD Follow up.   Specialty: Pulmonary Disease Why: next monday for bronchoscopy as scheduled Contact information: [address] [place] [address] [phone]                  The results of significant diagnostics from this hospitalization (including imaging, microbiology, ancillary and laboratory) are listed below for reference.    Significant Diagnostic Studies: DG Chest Port 1 View Result Date: 05/10/2023 CLINICAL DATA:  Status post right thoracentesis. EXAM: PORTABLE CHEST 1 VIEW COMPARISON:  05/09/2023 and chest CT 05/09/2023 FINDINGS: Improved aeration in the right lower chest compatible with recent thoracentesis. No evidence for pneumothorax. Again noted are streaky densities in the right mid lung. Left lung remains clear. Heart and mediastinum are within normal limits and stable. Atherosclerotic calcifications at the  aortic arch. IMPRESSION: 1. Improved aeration in the right lower chest compatible with recent thoracentesis. No evidence for pneumothorax. 2. Persistent streaky densities in the right mid lung region. Electronically Signed   By: Richarda Overlie M.D.   On: 05/10/2023 16:18   US THORACENTESIS ASP PLEURAL SPACE W/IMG GUIDE Result Date: 05/10/2023 INDICATION: 72 yo female with a history of lung cancer s/p radiation, now with chest pain, pulmonary edema, and loculated right pleural effusion. IRR requested for diagnostic and therapeutic right thoracentesis. EXAM: ULTRASOUND GUIDED DIAGNOSTIC AND THERAPEUTIC THORACENTESIS MEDICATIONS: 9 cc of 1% lidocaine COMPLICATIONS: None immediate. PROCEDURE: An ultrasound guided thoracentesis was thoroughly discussed with the patient and questions answered. The benefits, risks, alternatives and complications were also discussed. The patient understands and wishes to proceed with the procedure.  Written consent was obtained. Ultrasound was performed to localize and mark an adequate pocket of fluid in the right chest. The area was then prepped and draped in the normal sterile fashion. 1% Lidocaine was used for local anesthesia. Under ultrasound guidance a 6 Fr Safe-T-Centesis catheter was introduced. Thoracentesis was performed. The catheter was removed and a dressing applied. FINDINGS: A total of approximately 700 cc of dark red pleural fluid was removed. Samples were sent to the laboratory as requested by the clinical team. IMPRESSION: Successful ultrasound guided right thoracentesis yielding 700 cc of pleural fluid. Performed by Buzzy Han, PA-C. Electronically Signed   By: Richarda Overlie M.D.   On: 05/10/2023 16:03   CT Angio Chest Pulmonary Embolism (PE) W or WO Contrast Result Date: 05/09/2023 CLINICAL DATA:  Chest pain for the past 1-2 days with increased tachypnea. Chest radiographic findings concerning for right lung pneumonia or atelectasis. Clinical concern for pulmonary embolism. EXAM: CT ANGIOGRAPHY CHEST WITH CONTRAST TECHNIQUE: Multidetector CT imaging of the chest was performed using the standard protocol during bolus administration of intravenous contrast. Multiplanar CT image reconstructions and MIPs were obtained to evaluate the vascular anatomy. RADIATION DOSE REDUCTION: This exam was performed according to the departmental dose-optimization program which includes automated exposure control, adjustment of the mA and/or kV according to patient size and/or use of iterative reconstruction technique. CONTRAST:  75mL OMNIPAQUE IOHEXOL 350 MG/ML SOLN COMPARISON:  Portable chest obtained earlier today. Chest CTA dated 03/20/2023. PET-CT dated 04/06/2023. FINDINGS: Cardiovascular: Normally opacified pulmonary arteries with no pulmonary arterial filling defects seen. Borderline enlarged heart. No pericardial effusion. Atheromatous calcifications, including the coronary arteries and aorta.  Mediastinum/Nodes: Multiple mildly enlarged and borderline enlarged mediastinal lymph nodes with mild progression. These a were re previously hypermetabolic, suspicious for metastatic nodes. Node the largest has a short axis diameter of 9 mm on image number 47/4. Unremarkable thyroid gland. Interval flattening of the AP diameter of the trachea and mainstem bronchi, compatible with tracheomalacia. Unremarkable esophagus. Lungs/Pleura: Interval moderate-sized, loculated right pleural effusion. Increased size of the previously demonstrated amorphous soft tissue tissue/treatment related scarring in the right hilar and perihilar region with a more mass-like appearance today, measuring 6.2 x 5.2 cm on image number 60/4. This was previously hypermetabolic and highly suspicious for recurrent malignancy. This is causing right upper lobe branch compression and occlusion. Small amount of compressive right lower lobe atelectasis. Patchy ground-glass opacities throughout the left lung. No left pleural fluid seen. Upper Abdomen: Multiple gallstones in the gallbladder without gallbladder wall thickening or pericholecystic fluid. The largest stone measures 9.8 mm. The largest stone measures 9.6 mm. Musculoskeletal: Thoracic and lower cervical spine degenerative changes with changes of DISH. No evidence of bony metastatic  disease. Review of the MIP images confirms the above findings. IMPRESSION: 1. No pulmonary embolism. 2. Interval moderate-sized, loculated right pleural effusion. 3. Increased size of the previously demonstrated amorphous soft tissue/treatment related scarring in the right hilar and perihilar region with a more mass-like appearance today, measuring 6.2 x 5.2 cm. This was previously hypermetabolic and highly suspicious for recurrent malignancy. This is causing right upper lobe branch compression and occlusion. 4. Small amount of compressive right lower lobe atelectasis. 5. Patchy ground-glass opacities throughout  the left lung, possibly representing interstitial pulmonary edema or an infectious or inflammatory process. 6. Mild progression of multiple mildly enlarged and borderline enlarged mediastinal lymph nodes, suspicious for metastatic adenopathy. 7. Interval flattening of the AP diameter of the trachea and mainstem bronchi, compatible with tracheomalacia. 8. Cholelithiasis. 9. Minimal calcific coronary artery and mild calcific aortic atherosclerosis. Aortic Atherosclerosis (ICD10-I70.0). Electronically Signed   By: Beckie Salts M.D.   On: 05/09/2023 17:01   DG Chest Port 1 View Result Date: 05/09/2023 CLINICAL DATA:  Shortness of breath.  History of lung cancer. EXAM: PORTABLE CHEST 1 VIEW COMPARISON:  PET-CT dated April 06, 2023. Chest x-ray dated March 21, 2023. FINDINGS: The heart size and mediastinal contours are within normal limits. Unchanged right perihilar nodular thickening along the fissures. Increasing right pleural effusion with worsening right lower lobe aeration. No pneumothorax. No acute osseous abnormality. IMPRESSION: 1. Increasing right pleural effusion with worsening right lower lobe atelectasis versus infiltrate. 2. Unchanged right perihilar nodular thickening along the fissures. Electronically Signed   By: Obie Dredge M.D.   On: 05/09/2023 13:31    Microbiology: Recent Results (from the past 240 hours)  Culture, blood (routine x 2) Call MD if unable to obtain prior to antibiotics being given     Status: None (Preliminary result)   Collection Time: 05/09/23  6:50 PM   Specimen: BLOOD  Result Value Ref Range Status   Specimen Description BLOOD BLOOD RIGHT HAND  Final   Special Requests   Final    BOTTLES DRAWN AEROBIC AND ANAEROBIC Blood Culture results may not be optimal due to an inadequate volume of blood received in culture bottles   Culture   Final    NO GROWTH < 24 HOURS Performed at [facility], [address], [place], [place] [address]    Report Status  PENDING  Incomplete  Culture, blood (routine x 2) Call MD if unable to obtain prior to antibiotics being given     Status: None (Preliminary result)   Collection Time: 05/09/23  7:00 PM   Specimen: BLOOD  Result Value Ref Range Status   Specimen Description BLOOD BLOOD LEFT HAND  Final   Special Requests   Final    BOTTLES DRAWN AEROBIC AND ANAEROBIC Blood Culture results may not be optimal due to an inadequate volume of blood received in culture bottles   Culture   Final    NO GROWTH < 24 HOURS Performed at [facility], [address] [place], [place] [address]    Report Status PENDING  Incomplete     Labs: Basic Metabolic Panel: Recent Labs  Lab 05/09/23 1252 05/10/23 0426  NA 135 135  K 3.9 3.5  CL 102 101  CO2 23 25  GLUCOSE 144* 139*  BUN 9 15  CREATININE 0.69 0.74  CALCIUM 8.6* 8.3*   Liver Function Tests: Recent Labs  Lab 05/09/23 1252 05/10/23 0426  AST 16 16  ALT 14 14  ALKPHOS 38 38  BILITOT 0.5 0.5  PROT 6.9 7.1  ALBUMIN 3.3* 3.2*   No results for input(s): "LIPASE", "AMYLASE" in the last 168 hours. No results for input(s): "AMMONIA" in the last 168 hours. CBC: Recent Labs  Lab 05/09/23 1252 05/10/23 0426  WBC 6.6 6.0  NEUTROABS 4.6  --   HGB 12.4 12.4  HCT 38.6 38.1  MCV 89.8 88.0  PLT 273 314   Cardiac Enzymes: No results for input(s): "CKTOTAL", "CKMB", "CKMBINDEX", "TROPONINI" in the last 168 hours. BNP: BNP (last 3 results) Recent Labs    05/09/23 1252  BNP 19.5    ProBNP (last 3 results) No results for input(s): "PROBNP" in the last 8760 hours.  CBG: Recent Labs  Lab 05/09/23 1121 05/09/23 1856 05/09/23 2141 05/10/23 0735 05/10/23 1139  GLUCAP 162* 143* 207* 144* 215*       Signed:  Silvano Bilis MD.  [facility] 05/10/2023, 4:30 PM

## 2023-05-10 NOTE — Plan of Care (Signed)
  Problem: Education: Goal: Ability to describe self-care measures that may prevent or decrease complications (Diabetes Survival Skills Education) will improve Outcome: Progressing   Problem: Health Behavior/Discharge Planning: Goal: Ability to identify and utilize available resources and services will improve Outcome: Progressing   

## 2023-05-12 ENCOUNTER — Telehealth: Payer: Self-pay | Admitting: Oncology

## 2023-05-12 LAB — CYTOLOGY - NON PAP

## 2023-05-12 NOTE — Telephone Encounter (Signed)
Called patient to inform of appointment scheduled voicemail left with call back number if scheduled appointment day/time didn't work. Request per inbasket for appointment.

## 2023-05-13 ENCOUNTER — Ambulatory Visit: Payer: PPO | Admitting: Oncology

## 2023-05-13 LAB — COMP PANEL: LEUKEMIA/LYMPHOMA

## 2023-05-14 LAB — BODY FLUID CULTURE W GRAM STAIN: Culture: NO GROWTH

## 2023-05-14 LAB — CULTURE, BLOOD (ROUTINE X 2)
Culture: NO GROWTH
Culture: NO GROWTH

## 2023-05-15 MED ORDER — ORAL CARE MOUTH RINSE: 15.0000 mL | Freq: Once | OROMUCOSAL | Status: AC

## 2023-05-15 MED ORDER — SODIUM CHLORIDE 0.9 % IV SOLN: INTRAVENOUS | Status: DC

## 2023-05-15 MED ORDER — IPRATROPIUM-ALBUTEROL 0.5-2.5 (3) MG/3ML IN SOLN
3.0000 mL | Freq: Once | RESPIRATORY_TRACT | Status: AC
  Administered 2023-05-16: 3 mL via RESPIRATORY_TRACT

## 2023-05-15 MED ORDER — SODIUM CHLORIDE 0.9 % IV SOLN: Freq: Once | INTRAVENOUS | Status: DC

## 2023-05-15 MED ORDER — CHLORHEXIDINE GLUCONATE 0.12 % MT SOLN
15.0000 mL | Freq: Once | OROMUCOSAL | Status: AC
  Administered 2023-05-16: 15 mL via OROMUCOSAL

## 2023-05-16 ENCOUNTER — Ambulatory Visit: Payer: PPO | Admitting: Anesthesiology

## 2023-05-16 ENCOUNTER — Ambulatory Visit
Admission: RE | Admit: 2023-05-16 | Discharge: 2023-05-16 | Disposition: A | Discharge status: Home / Self Care | Payer: PPO | Source: Ambulatory Visit | Attending: Pulmonary Disease | Admitting: Pulmonary Disease

## 2023-05-16 ENCOUNTER — Encounter: Payer: Self-pay | Admitting: Pulmonary Disease

## 2023-05-16 ENCOUNTER — Ambulatory Visit: Payer: PPO

## 2023-05-16 ENCOUNTER — Encounter
Admission: RE | Disposition: A | Discharge status: Home / Self Care | Payer: Self-pay | Source: Ambulatory Visit | Attending: Pulmonary Disease

## 2023-05-16 ENCOUNTER — Ambulatory Visit: Payer: PPO | Admitting: Oncology

## 2023-05-16 ENCOUNTER — Other Ambulatory Visit: Payer: Self-pay

## 2023-05-16 DIAGNOSIS — R846 Abnormal cytological findings in specimens from respiratory organs and thorax: Secondary | ICD-10-CM | POA: Diagnosis not present

## 2023-05-16 DIAGNOSIS — I509 Heart failure, unspecified: Secondary | ICD-10-CM | POA: Diagnosis not present

## 2023-05-16 DIAGNOSIS — Z7901 Long term (current) use of anticoagulants: Secondary | ICD-10-CM | POA: Insufficient documentation

## 2023-05-16 DIAGNOSIS — I110 Hypertensive heart disease with heart failure: Secondary | ICD-10-CM | POA: Insufficient documentation

## 2023-05-16 DIAGNOSIS — J984 Other disorders of lung: Secondary | ICD-10-CM | POA: Diagnosis not present

## 2023-05-16 DIAGNOSIS — C771 Secondary and unspecified malignant neoplasm of intrathoracic lymph nodes: Secondary | ICD-10-CM | POA: Diagnosis not present

## 2023-05-16 DIAGNOSIS — E669 Obesity, unspecified: Secondary | ICD-10-CM | POA: Diagnosis not present

## 2023-05-16 DIAGNOSIS — I5042 Chronic combined systolic (congestive) and diastolic (congestive) heart failure: Secondary | ICD-10-CM | POA: Diagnosis not present

## 2023-05-16 DIAGNOSIS — J90 Pleural effusion, not elsewhere classified: Secondary | ICD-10-CM | POA: Diagnosis not present

## 2023-05-16 DIAGNOSIS — Z6839 Body mass index (BMI) 39.0-39.9, adult: Secondary | ICD-10-CM | POA: Insufficient documentation

## 2023-05-16 DIAGNOSIS — Z6835 Body mass index (BMI) 35.0-35.9, adult: Secondary | ICD-10-CM | POA: Diagnosis not present

## 2023-05-16 DIAGNOSIS — Z48813 Encounter for surgical aftercare following surgery on the respiratory system: Secondary | ICD-10-CM | POA: Diagnosis not present

## 2023-05-16 DIAGNOSIS — Z85118 Personal history of other malignant neoplasm of bronchus and lung: Secondary | ICD-10-CM | POA: Insufficient documentation

## 2023-05-16 DIAGNOSIS — Z7985 Long-term (current) use of injectable non-insulin antidiabetic drugs: Secondary | ICD-10-CM | POA: Insufficient documentation

## 2023-05-16 DIAGNOSIS — R911 Solitary pulmonary nodule: Secondary | ICD-10-CM | POA: Diagnosis not present

## 2023-05-16 DIAGNOSIS — R069 Unspecified abnormalities of breathing: Secondary | ICD-10-CM | POA: Diagnosis not present

## 2023-05-16 DIAGNOSIS — J9811 Atelectasis: Secondary | ICD-10-CM | POA: Diagnosis not present

## 2023-05-16 DIAGNOSIS — R918 Other nonspecific abnormal finding of lung field: Secondary | ICD-10-CM | POA: Diagnosis not present

## 2023-05-16 DIAGNOSIS — Z86711 Personal history of pulmonary embolism: Secondary | ICD-10-CM | POA: Diagnosis not present

## 2023-05-16 DIAGNOSIS — E1165 Type 2 diabetes mellitus with hyperglycemia: Secondary | ICD-10-CM

## 2023-05-16 DIAGNOSIS — J398 Other specified diseases of upper respiratory tract: Secondary | ICD-10-CM | POA: Insufficient documentation

## 2023-05-16 DIAGNOSIS — Z923 Personal history of irradiation: Secondary | ICD-10-CM | POA: Diagnosis not present

## 2023-05-16 DIAGNOSIS — Z86718 Personal history of other venous thrombosis and embolism: Secondary | ICD-10-CM | POA: Insufficient documentation

## 2023-05-16 DIAGNOSIS — C801 Malignant (primary) neoplasm, unspecified: Secondary | ICD-10-CM | POA: Diagnosis not present

## 2023-05-16 DIAGNOSIS — R590 Localized enlarged lymph nodes: Secondary | ICD-10-CM | POA: Diagnosis not present

## 2023-05-16 HISTORY — PX: FLEXIBLE BRONCHOSCOPY: SHX5094

## 2023-05-16 HISTORY — PX: ENDOBRONCHIAL ULTRASOUND: SHX5096

## 2023-05-16 LAB — GLUCOSE, CAPILLARY: Glucose-Capillary: 154 mg/dL — ABNORMAL HIGH (ref 70–99)

## 2023-05-16 SURGERY — BRONCHOSCOPY, FLEXIBLE
Anesthesia: General

## 2023-05-16 MED ORDER — KETAMINE HCL 50 MG/ML IJ SOLN
INTRAMUSCULAR | Status: AC
  Filled 2023-05-16: qty 1

## 2023-05-16 MED ORDER — IPRATROPIUM-ALBUTEROL 0.5-2.5 (3) MG/3ML IN SOLN
RESPIRATORY_TRACT | Status: AC
  Filled 2023-05-16: qty 3

## 2023-05-16 MED ORDER — LACTATED RINGERS IV SOLN: INTRAVENOUS | Status: DC | PRN

## 2023-05-16 MED ORDER — SODIUM CHLORIDE 0.9 % IV SOLN: Freq: Once | INTRAVENOUS | Status: DC

## 2023-05-16 MED ORDER — ONDANSETRON HCL 4 MG/2ML IJ SOLN
INTRAMUSCULAR | Status: DC | PRN
  Administered 2023-05-16: 4 mg via INTRAVENOUS

## 2023-05-16 MED ORDER — ACETAMINOPHEN 10 MG/ML IV SOLN: 1000.0000 mg | Freq: Once | INTRAVENOUS | Status: DC | PRN

## 2023-05-16 MED ORDER — MIDAZOLAM HCL 2 MG/2ML IJ SOLN
INTRAMUSCULAR | Status: AC
Start: 2023-05-16 — End: ?
  Filled 2023-05-16: qty 2

## 2023-05-16 MED ORDER — OXYCODONE HCL 5 MG/5ML PO SOLN: 5.0000 mg | Freq: Once | ORAL | Status: DC | PRN

## 2023-05-16 MED ORDER — FENTANYL CITRATE (PF) 100 MCG/2ML IJ SOLN
INTRAMUSCULAR | Status: DC | PRN
  Administered 2023-05-16: 100 ug via INTRAVENOUS

## 2023-05-16 MED ORDER — LIDOCAINE HCL (CARDIAC) PF 100 MG/5ML IV SOSY
PREFILLED_SYRINGE | INTRAVENOUS | Status: DC | PRN
  Administered 2023-05-16: 100 mg via INTRAVENOUS

## 2023-05-16 MED ORDER — ETOMIDATE 2 MG/ML IV SOLN
INTRAVENOUS | Status: DC | PRN
  Administered 2023-05-16: 10 mg via INTRAVENOUS

## 2023-05-16 MED ORDER — PROPOFOL 1000 MG/100ML IV EMUL
INTRAVENOUS | Status: AC
  Filled 2023-05-16: qty 100

## 2023-05-16 MED ORDER — PHENOL 1.4 % MT LIQD
1.0000 | OROMUCOSAL | Status: DC | PRN
  Administered 2023-05-16: 1 via OROMUCOSAL
  Filled 2023-05-16: qty 177

## 2023-05-16 MED ORDER — ROCURONIUM BROMIDE 10 MG/ML (PF) SYRINGE
PREFILLED_SYRINGE | INTRAVENOUS | Status: AC
  Filled 2023-05-16: qty 10

## 2023-05-16 MED ORDER — FENTANYL CITRATE (PF) 100 MCG/2ML IJ SOLN: 25.0000 ug | INTRAMUSCULAR | Status: DC | PRN

## 2023-05-16 MED ORDER — SUGAMMADEX SODIUM 200 MG/2ML IV SOLN
INTRAVENOUS | Status: DC | PRN
  Administered 2023-05-16 (×2): 200 mg via INTRAVENOUS

## 2023-05-16 MED ORDER — FENTANYL CITRATE (PF) 100 MCG/2ML IJ SOLN
INTRAMUSCULAR | Status: AC
  Filled 2023-05-16: qty 2

## 2023-05-16 MED ORDER — PROPOFOL 10 MG/ML IV BOLUS
INTRAVENOUS | Status: DC | PRN
  Administered 2023-05-16: 200 mg via INTRAVENOUS
  Administered 2023-05-16: 100 ug/kg/min via INTRAVENOUS

## 2023-05-16 MED ORDER — DROPERIDOL 2.5 MG/ML IJ SOLN: 0.6250 mg | Freq: Once | INTRAMUSCULAR | Status: DC | PRN

## 2023-05-16 MED ORDER — CHLORHEXIDINE GLUCONATE 0.12 % MT SOLN
OROMUCOSAL | Status: AC
Start: 2023-05-16 — End: ?
  Filled 2023-05-16: qty 15

## 2023-05-16 MED ORDER — ROCURONIUM BROMIDE 100 MG/10ML IV SOLN
INTRAVENOUS | Status: DC | PRN
  Administered 2023-05-16: 80 mg via INTRAVENOUS

## 2023-05-16 MED ORDER — DEXAMETHASONE SODIUM PHOSPHATE 10 MG/ML IJ SOLN
INTRAMUSCULAR | Status: DC | PRN
  Administered 2023-05-16: 4 mg via INTRAVENOUS

## 2023-05-16 MED ORDER — PHENYLEPHRINE 80 MCG/ML (10ML) SYRINGE FOR IV PUSH (FOR BLOOD PRESSURE SUPPORT)
PREFILLED_SYRINGE | INTRAVENOUS | Status: DC | PRN
  Administered 2023-05-16 (×4): 80 ug via INTRAVENOUS

## 2023-05-16 MED ORDER — OXYCODONE HCL 5 MG PO TABS: 5.0000 mg | ORAL_TABLET | Freq: Once | ORAL | Status: DC | PRN

## 2023-05-16 MED ORDER — PROPOFOL 10 MG/ML IV BOLUS
INTRAVENOUS | Status: AC
  Filled 2023-05-16: qty 40

## 2023-05-16 MED ORDER — LIDOCAINE HCL (PF) 2 % IJ SOLN
INTRAMUSCULAR | Status: AC
  Filled 2023-05-16: qty 5

## 2023-05-16 NOTE — Anesthesia Preprocedure Evaluation (Signed)
Anesthesia Evaluation  Patient identified by MRN, date of birth, ID band Patient awake    Reviewed: Allergy & Precautions, H&P , NPO status , Patient's Chart, lab work & pertinent test results, reviewed documented beta blocker date and time   Airway Mallampati: II  TM Distance: >3 FB Neck ROM: full    Dental  (+) Teeth Intact   Pulmonary shortness of breath and with exertion, pneumonia, resolved, COPD   Pulmonary exam normal        Cardiovascular Exercise Tolerance: Poor hypertension, On Medications + angina with exertion +CHF  (-) Orthopnea, (-) PND and (-) DOE Normal cardiovascular exam Rhythm:regular Rate:Normal     Neuro/Psych  Neuromuscular disease  negative psych ROS   GI/Hepatic Neg liver ROS, hiatal hernia,GERD  ,,  Endo/Other  negative endocrine ROSdiabetes, Well Controlled, Type 2, Oral Hypoglycemic Agents    Renal/GU negative Renal ROS  negative genitourinary   Musculoskeletal   Abdominal   Peds  Hematology negative hematology ROS (+)   Anesthesia Other Findings Past Medical History: No date: Anginal pain (HCC) No date: Arthritis     Comment:  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, 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 No date: CHF (congestive heart failure) (HCC)     Comment:  followed colon resection, "wouldn't let me get up" No date: Chronic anticoagulation No date: Degenerative disc disease, lumbar 2018: Diverticulitis No date: Diverticulosis 2018: DVT of lower extremity, bilateral (HCC) No date: Essential hypertension No date: History of being hospitalized     Comment:  1 month ago for 3 days, vomiting, and kink in upper               intestine No date: History of hiatal hernia 04/2023: Mediastinal adenopathy No date: MRSA nasal colonization 2022: Non-small cell lung cancer (HCC)     Comment:  right No date: Obesity (BMI 30-39.9) No date: Osteoarthritis No date: Osteoporosis 2018: PE (pulmonary  thromboembolism) (HCC) No date: Pneumonia 03/20/2023: Small bowel obstruction (HCC) No date: 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HCC) No date: Type 2 diabetes mellitus with obesity (HCC) No date: Vertigo Past Surgical History: 02/24/2017: APPENDECTOMY; N/A     Comment:  Procedure: Incidental  APPENDECTOMY;  Surgeon: Ricarda Frame, MD;  Location: [name];  Service: General;                Laterality: N/A; No date: CARPAL TUNNEL RELEASE; Bilateral 11/21/2018: CATARACT EXTRACTION W/PHACO; Left     Comment:  Procedure: CATARACT EXTRACTION PHACO AND INTRAOCULAR               LENS PLACEMENT (IOC) LEFT DIABETES;  Surgeon: Galen Manila, MD;  Location: [facility];  Service:               Ophthalmology;  Laterality: Left;  latex               sensitivity Diabetic - oral meds 12/12/2018: CATARACT EXTRACTION W/PHACO; Right     Comment:  Procedure: CATARACT EXTRACTION PHACO AND INTRAOCULAR               LENS PLACEMENT (IOC) RIGHT DIABETES;  Surgeon: Galen Manila, MD;  Location: [facility];  Service:  Ophthalmology;  Laterality: Right;  Diabetes-oral               med Latex sensitiviy 11/02/2016: COLON RESECTION SIGMOID; N/A     Comment:  Procedure: COLON RESECTION SIGMOID;  Surgeon: Ricarda Frame, MD;  Location: [name];  Service: General;                Laterality: N/A; 11/02/2016: COLON SURGERY 02/03/2017: COLONOSCOPY WITH PROPOFOL; N/A     Comment:  Procedure: COLONOSCOPY WITH PROPOFOL;  Surgeon: Toney Reil, MD;  Location: [name];  Service:               Gastroenterology;  Laterality: N/A; 11/02/2016: COLOSTOMY; N/A     Comment:  Procedure: COLOSTOMY;  Surgeon: Ricarda Frame, MD;                Location: [name];  Service: General;  Laterality: N/A; 02/24/2017: COLOSTOMY REVERSAL; N/A     Comment:  Procedure: COLOSTOMY REVERSAL, ostomy takedown,  spleenic              flexure mobilization, excision rectal stump/distal               sigmoid, anastomosis with suture reinforcement;  Surgeon:              Ricarda Frame, MD;  Location: [name];  Service:               General;  Laterality: N/A; 02/24/2017: ILEO LOOP DIVERSION; N/A     Comment:  Procedure: ILEO LOOP COLOSTOMY;  Surgeon: Ricarda Frame, MD;  Location: [name];  Service: General;                Laterality: N/A; 06/01/2017: ILEOSTOMY CLOSURE; N/A     Comment:  Procedure: LOOP ILEOSTOMY TAKEDOWN;  Surgeon: Ricarda Frame, MD;  Location: [name];  Service: General;                Laterality: N/A; 10/2016: IVC FILTER INSERTION; Right 08/09/2017: IVC FILTER REMOVAL; N/A     Comment:  Procedure: IVC FILTER REMOVAL;  Surgeon: Renford Dills, MD;  Location: [name];  Service:              Cardiovascular;  Laterality: N/A; No date: KNEE SURGERY; Right     Comment:  torn menicus 02/24/2017: LAPAROSCOPY; N/A     Comment:  Procedure: LAPAROSCOPY DIAGNOSTIC;  Surgeon: Ricarda Frame, MD;  Location: [name];  Service: General;                Laterality: N/A; 02/24/2017: LYSIS OF ADHESION; N/A     Comment:  Procedure: LYSIS OF ADHESION;  Surgeon: Ricarda Frame, MD;  Location: [name];  Service: General;                Laterality: N/A; 11/19/2016: PULMONARY VENOGRAPHY; N/A     Comment:  Procedure: Pulmonary Venography; IVC filter  placement;               possible pulmonary thrombectomy;  Surgeon: Renford Dills, MD;  Location: [name];  Service:              Cardiovascular;  Laterality: N/A; 11/08/2017: SACROPLASTY; N/A     Comment:  Procedure: SACROPLASTY S2;  Surgeon: Kennedy Bucker, MD;               Location: [name];  Service: Orthopedics;  Laterality:               N/A; 11/13/2016: SIGMOIDOSCOPY; N/A     Comment:  Procedure: endoscopic   flexible SIGMOIDOSCOPY;  Surgeon:              Lattie Haw, MD;  Location: [name];  Service:               General;  Laterality: N/A; 05/19/2017: Daiva Huge; N/A     Comment:  Procedure: FLEXIBLE SIGMOIDOSCOPY;  Surgeon: Ricarda Frame, MD;  Location: [name];  Service: General;                Laterality: N/A; 02/23/2021: VIDEO BRONCHOSCOPY WITH ENDOBRONCHIAL NAVIGATION; N/A     Comment:  Procedure: ROBOTIC VIDEO BRONCHOSCOPY WITH ENDOBRONCHIAL              NAVIGATION;  Surgeon: [name], MD;  Location:               [name];  Service: Pulmonary;  Laterality: N/A; BMI    Body Mass Index: 35.61 kg/m     Reproductive/Obstetrics negative OB ROS                             Anesthesia Physical Anesthesia Plan  ASA: 4  Anesthesia Plan: General ETT   Post-op Pain Management:    Induction:   PONV Risk Score and Plan: 4 or greater  Airway Management Planned:   Additional Equipment:   Intra-op Plan:   Post-operative Plan:   Informed Consent: I have reviewed the patients History and Physical, chart, labs and discussed the procedure including the risks, benefits and alternatives for the proposed anesthesia with the patient or authorized representative who has indicated his/her understanding and acceptance.     Dental Advisory Given  Plan Discussed with: CRNA  Anesthesia Plan Comments:        Anesthesia Quick Evaluation

## 2023-05-16 NOTE — Anesthesia Postprocedure Evaluation (Signed)
Anesthesia Post Note  Patient: Kara [name]  Procedure(s) Performed: FLEXIBLE BRONCHOSCOPY ENDOBRONCHIAL ULTRASOUND  Patient location during evaluation: PACU Anesthesia Type: General Level of consciousness: awake and alert Pain management: pain level controlled Vital Signs Assessment: post-procedure vital signs reviewed and stable Respiratory status: spontaneous breathing, nonlabored ventilation, respiratory function stable and patient connected to nasal cannula oxygen Cardiovascular status: blood pressure returned to baseline and stable Postop Assessment: no apparent nausea or vomiting Anesthetic complications: no  No notable events documented.   Last Vitals:  Vitals:   05/16/23 1530 05/16/23 1546  BP: 96/65 113/70  Pulse: 80 84  Resp: 16 16  Temp: (!) 36.1 C (!) 36.1 C  SpO2: 95% 92%    Last Pain:  Vitals:   05/16/23 1546  TempSrc: Temporal  PainSc: 3                  Stephanie Coup

## 2023-05-16 NOTE — Transfer of Care (Signed)
Immediate Anesthesia Transfer of Care Note  Patient: Kara [name]  Procedure(s) Performed: FLEXIBLE BRONCHOSCOPY ENDOBRONCHIAL ULTRASOUND  Patient Location: PACU  Anesthesia Type:General  Level of Consciousness: awake, alert , and oriented  Airway & Oxygen Therapy: Patient Spontanous Breathing and Patient connected to face mask oxygen  Post-op Assessment: Report given to RN and Post -op Vital signs reviewed and stable  Post vital signs: stable  Last Vitals:  Vitals Value Taken Time  BP 109/51 05/16/23 1515  Temp 36.1 C 05/16/23 1507  Pulse 85 05/16/23 1517  Resp 18 05/16/23 1517  SpO2 89 % 05/16/23 1517  Vitals shown include unfiled device data.  Last Pain:  Vitals:   05/16/23 1507  TempSrc:   PainSc: 0-No pain         Complications: No notable events documented.

## 2023-05-16 NOTE — Discharge Instructions (Signed)
MAy restart Coumadin tommorow

## 2023-05-16 NOTE — Anesthesia Procedure Notes (Signed)
Procedure Name: Intubation Date/Time: 05/16/2023 2:12 PM  Performed by: Darrell Jewel I, CRNAPre-anesthesia Checklist: Patient identified, Emergency Drugs available, Suction available, Patient being monitored and Timeout performed Patient Re-evaluated:Patient Re-evaluated prior to induction Oxygen Delivery Method: Circle system utilized Preoxygenation: Pre-oxygenation with 100% oxygen Induction Type: IV induction Ventilation: Mask ventilation without difficulty Laryngoscope Size: McGrath and 3 Grade View: Grade I Tube type: Oral Tube size: 8.5 mm Number of attempts: 1 Airway Equipment and Method: Stylet Placement Confirmation: ETT inserted through vocal cords under direct vision, positive ETCO2, CO2 detector and breath sounds checked- equal and bilateral Secured at: 20 cm Tube secured with: Tape Dental Injury: Teeth and Oropharynx as per pre-operative assessment

## 2023-05-16 NOTE — Interval H&P Note (Signed)
Kara [name] has presented today for surgery, with the diagnosis of MEDIASTINAL ADENOPATHY.  The various methods of treatment have been discussed with the patient and family. After consideration of risks, benefits and other options for treatment, the patient has consented to  Procedure(s): BRONCHOSCOPY WITH BIOPSIES, ENDOBRONCHIAL ULTRASOUND WITH TBNA-bilateral as a surgical intervention.  The patient's history has been reviewed, patient examined, no change in status, stable for surgery.  I have reviewed the patient's chart and labs.  Questions were answered to the patient's satisfaction.  Patient agrees to proceed.  Gailen Shelter, MD Advanced Bronchoscopy PCCM [facility]    *This note was generated using voice recognition software/Dragon and/or AI transcription program.  Despite best efforts to proofread, errors can occur which can change the meaning. Any transcriptional errors that result from this process are unintentional and may not be fully corrected at the time of dictation.

## 2023-05-16 NOTE — Op Note (Signed)
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  On surveillance PET/CT performed recently she was noted to have mediastinal adenopathy and increase activity and area of consolidation from the prior radiation therapy.  Patient was referred for bronchoscopy with endobronchial ultrasound for slam planing of the lymph nodes.  Preoperative diagnosis: Mediastinal adenopathy, right lung consolidation, rule out cancer recurrence Postoperative diagnosis: Same as above   Consent: (Place X beside choice/s below)  The benefits, risks and possible complications of the procedure were        explained to:  _X__ patient  ___ patient's family  ___ other:___________  who verbalized understanding and gave:  ___ verbal  ___ written  _X__ verbal and written  ___ telephone  ___ other:________ consent.      Unable to obtain consent; procedure performed on emergent basis.     Other:    Benefits, limitations and potential complications of the procedure were discussed with the patient/family.  Complications from bronchoscopy are rare and most often minor, but if they occur they may include breathing difficulty, vocal cord spasm, hoarseness, slight fever, vomiting, dizziness, bronchospasm, infection, low blood oxygen, bleeding from biopsy site, or an allergic reaction to medications.  It is uncommon for patients to experience other more serious complications for example: Collapsed lung requiring chest tube placement, respiratory failure, heart attack and/or cardiac arrhythmia.  Patient agrees to proceed.    Anesthesia type: General endotracheal  Surgeon: Gailen Shelter,  MD Assistant/Scrub: Everlean Alstrom, RRT Anesthesiologist/CRNA: Gwenyth Bender, MD/Janette Alita Chyle, CRNA Cytotechnology: Available   PROCEDURE DETAILS: Patient was taken to Procedure Room 2 (Bronchoscopy Suite) in the OR area.  Appropriate Timeout performed and correct patient, name, ID and laterality confirmed.  Patient was inducted under general anesthesia and intubated with an 8.5 ET tube without difficulty.  Once the patient was under adequate general anesthesia a Portex adapter was placed in the ET tube flange.  Through the Portex adapter the Olympus video therapeutic bronchoscope was then advanced.  The visible distal trachea was normal, carina appeared full.  Examination of the entire tracheobronchial tree showed collapsible airways consistent with tracheomalacia.  The right upper lobe , right middle lobe, lower lobe bronchi were free of endobronchial lesions however there were significant postradiation changes noted on the right upper lobe, right middle lobe and medial right lower lobe bronchi.  Brushings were done x 2 on the right middle lobe bronchus with distortion.  Brushing Kyndal was consistent with inflammatory/radiation change.  At this point bronchoscope was brought to the left mainstem and examination revealed no endobronchial lesions on the left tracheobronchial tree.  After completing this, the bronchoscope was switched to an endobronchial ultrasound scope (EBUS scope) and the mediastinum was examined.  There was significant adenopathy on station 7 with an amorphous looking lymph node measuring 2 x 2.5 cm, no significant adenopathy on the left hilum, and there were small lymph nodes on the 4R and 4L stations but these did not meet criteria for malignancy.  We proceeded to sample the station 7 lymph node.  Kindred revealed lesional cells.  A total of 7 passes were performed these were placed in preservative for further analysis.  Having completed this, the EBUS  scope was retrieved and again replaced  for the therapeutic video bronchoscope.  The airway was then examined thoroughly to ensure adequate hemostasis.  After examination and inspection and noting excellent hemostasis, the patient received 9 mL of 1% lidocaine via bronchial lavage prior to retrieving the bronchoscope.  At this point the procedure was terminated and the patient was allowed to emerge from general anesthesia.  Patient was extubated in the procedure room and transported to the PACU in satisfactory condition.  The patient did not have any complaints of chest pain or shortness of breath postprocedure.  Postprocedure examination shows symmetrical lung sounds.  Postprocedure chest x-ray showed no pneumothorax.  Patient tolerated the procedure well.   SPECIMENS (Sites): (Place X beside choice below)  Specimens Description   No Specimens Obtained     Washings    Lavage    Biopsies   X Fine Needle Aspirates EBUS TBNA Station7, X 7  X Brushings RLL, X 2   Sputum    FINDINGS:  Postradiation changes on the right upper lobe:   Distortion on the right lower lobe bronchus:   Lymph node on station 7  2 cm x 2.5 cm:   EBUS needle penetrating lymph node on station 7:    Very small 0.5 to 0.6 cm lymph node in the precarinal area    ESTIMATED BLOOD LOSS: Less than 2 mL   COMPLICATIONS/RESOLUTION: none, post procedure chest x-ray showed no pneumothorax:      IMPRESSION:POST-PROCEDURE DX:   Mediastinal lymphadenopathy more prominent on station 7 status post EBUS/TBNA sampling Distortion on right lower lobe bronchus status post brushings x 2 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 Danice Goltz, MD Advanced Bronchoscopy PCCM [facility]    *This note was dictated using voice recognition software/Dragon.  Despite best efforts to  proofread, errors can occur which can change the meaning.  Any change was purely unintentional.

## 2023-05-16 NOTE — H&P (Signed)
Kara [name] is an 72 y.o. adult.   Chief Complaint: Mediastinal adenopathy, lung mass, right pleural effusion.   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  Patient has been evaluated by me for robotic assisted bronchoscopy for her cancer diagnosis in October 2022.  Had been followed by oncology since then.    Subsequently the patient was seen on 14 April 2023 for evaluation of mediastinal adenopathy noted on a surveillance PET/CT scan ordered by Dr. Smith Robert, her oncologist.  The patient initially was scheduled to have the procedure done on 09 May 2023 however, on preoperative evaluation during that visit she was complaining of chest pain and shortness of breath and had to be admitted overnight for evaluation.  She was noted to have a right pleural effusion which was tapped during her brief admission and showed an eosinophilic predominance.  It is consistent with an exudate.  It did not show any malignant cells mostly inflammatory exudate.  She was subsequently discharged on 10 May 2023 and bronchoscopy with endobronchial ultrasound was rescheduled for today.  She presents today having no issues respiratory wise.  She does have some pleuritic chest pain noted on the site for thoracentesis (right) however has not had increased work of breathing, fevers, chills or sweats.  No cough or sputum production.  Just pleuritic chest pain when she takes a deep breath.  Of note the patient had a CT angio chest on 09 May 2023 that showed no pulmonary embolism.  Moderate size loculated right pleural effusion, increased size of the previously demonstrated amorphous soft tissue treatment related scarring in the right hilar and perihilar region with a more masslike  appearance measuring 6.2 x 5.2 small amount of compressive right lower lobe atelectasis patchy groundglass opacities throughout the lung progression of the multiple mildly enlarged/poorly enlarged mediastinal lymph nodes spacious for metastatic adenopathy and tracheomalacia.  Patient has been off warfarin.   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, 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   CHF (congestive heart failure) (HCC)    followed colon resection, "wouldn't let me get up"   Chronic anticoagulation    Degenerative disc disease, lumbar    Diverticulitis 2018   Diverticulosis    DVT of lower extremity, bilateral (HCC) 2018   Essential hypertension    History of being hospitalized    1 month ago for 3 days, vomiting, and kink in upper intestine   History of hiatal hernia    Mediastinal adenopathy 04/2023   MRSA nasal colonization    Non-small cell lung cancer (HCC) 2022   right   Obesity (BMI 30-39.9)    Osteoarthritis    Osteoporosis    PE (pulmonary thromboembolism) (HCC) 2018   Pneumonia    Small bowel obstruction (HCC) 03/20/2023   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HCC)    Type 2 diabetes mellitus with obesity (HCC)    Vertigo     Past Surgical History:  Procedure Laterality Date   APPENDECTOMY N/A 02/24/2017   Procedure: Incidental  APPENDECTOMY;  Surgeon: Kara [name];  Location: [name];  Service: General;  Laterality: N/A;   CARPAL TUNNEL RELEASE Bilateral    CATARACT EXTRACTION W/PHACO Left 11/21/2018  Procedure: CATARACT EXTRACTION PHACO AND INTRAOCULAR LENS PLACEMENT (IOC) LEFT DIABETES;  Surgeon: Kara [name];  Location: [facility];  Service: Ophthalmology;  Laterality: Left;  latex sensitivity Diabetic - oral meds   CATARACT EXTRACTION W/PHACO Right 12/12/2018   Procedure: CATARACT EXTRACTION PHACO AND INTRAOCULAR LENS PLACEMENT (IOC) RIGHT DIABETES;  Surgeon: Kara Manila,  [name];  Location: [facility];  Service: Ophthalmology;  Laterality: Right;  Diabetes-oral med Latex sensitiviy   COLON RESECTION SIGMOID N/A 11/02/2016   Procedure: COLON RESECTION SIGMOID;  Surgeon: Kara [name];  Location: [name];  Service: General;  Laterality: N/A;   COLON SURGERY  11/02/2016   COLONOSCOPY WITH PROPOFOL N/A 02/03/2017   Procedure: COLONOSCOPY WITH PROPOFOL;  Surgeon: Kara [name];  Location: [name];  Service: Gastroenterology;  Laterality: N/A;   COLOSTOMY N/A 11/02/2016   Procedure: COLOSTOMY;  Surgeon: Kara [name];  Location: [name];  Service: General;  Laterality: N/A;   COLOSTOMY REVERSAL N/A 02/24/2017   Procedure: COLOSTOMY REVERSAL, ostomy takedown, spleenic flexure mobilization, excision rectal stump/distal sigmoid, anastomosis with suture reinforcement;  Surgeon: Kara [name];  Location: [name];  Service: General;  Laterality: N/A;   ILEO LOOP DIVERSION N/A 02/24/2017   Procedure: ILEO LOOP COLOSTOMY;  Surgeon: Kara [name];  Location: [name];  Service: General;  Laterality: N/A;   ILEOSTOMY CLOSURE N/A 06/01/2017   Procedure: LOOP ILEOSTOMY TAKEDOWN;  Surgeon: Kara [name];  Location: [name];  Service: General;  Laterality: N/A;   IVC FILTER INSERTION Right 10/2016   IVC FILTER REMOVAL N/A 08/09/2017   Procedure: IVC FILTER REMOVAL;  Surgeon: Kara [name];  Location: [name];  Service: Cardiovascular;  Laterality: N/A;   KNEE SURGERY Right    torn menicus   LAPAROSCOPY N/A 02/24/2017   Procedure: LAPAROSCOPY DIAGNOSTIC;  Surgeon: Kara [name];  Location: [name];  Service: General;  Laterality: N/A;   LYSIS OF ADHESION N/A 02/24/2017   Procedure: LYSIS OF ADHESION;  Surgeon: Kara [name];  Location: [name];  Service: General;  Laterality: N/A;   PULMONARY VENOGRAPHY N/A 11/19/2016   Procedure: Pulmonary Venography; IVC filter placement; possible  pulmonary thrombectomy;  Surgeon: Kara [name];  Location: [name];  Service: Cardiovascular;  Laterality: N/A;   SACROPLASTY N/A 11/08/2017   Procedure: SACROPLASTY S2;  Surgeon: Kennedy Bucker, [name];  Location: [name];  Service: Orthopedics;  Laterality: N/A;   SIGMOIDOSCOPY N/A 11/13/2016   Procedure: endoscopic  flexible SIGMOIDOSCOPY;  Surgeon: Lattie Haw, [name];  Location: [name];  Service: General;  Laterality: N/A;   SIGMOIDOSCOPY N/A 05/19/2017   Procedure: Arnell Sieving;  Surgeon: Kara [name];  Location: [name];  Service: General;  Laterality: N/A;   VIDEO BRONCHOSCOPY WITH ENDOBRONCHIAL NAVIGATION N/A 02/23/2021   Procedure: ROBOTIC VIDEO BRONCHOSCOPY WITH ENDOBRONCHIAL NAVIGATION;  Surgeon: [name], [name];  Location: [name];  Service: Pulmonary;  Laterality: N/A;    Family History  Problem Relation Age of Onset   Diabetes Mother    Heart disease Mother    Cancer Mother    Breast cancer Mother        >50   Heart disease Father    Diabetes Sister    Heart disease Sister    Social History:  reports that she has never smoked. She has been exposed to tobacco smoke. She has never used smokeless tobacco. She reports that she does not drink alcohol and does not use drugs.  Allergies:  Allergies  Allergen Reactions   Adhesive [Tape] Other (See Comments)    Pt reports, when removing tape from skin most tapes pull her skin off. Ok to use paper tape   Other Hives    Chlorhexadine wipes/CHG wipes,    Latex Rash    Exam gloves/ does not react around elastic and lips don't swell when blowing balloons   Morphine And Codeine Nausea And Vomiting    Medications Prior to Admission  Medication Sig Dispense Refill   calcium carbonate (CALCIUM 600) 600 MG TABS tablet Take 1 tablet (600 mg total) by mouth 2 (two) times daily with a meal. 60 tablet 5   candesartan (ATACAND) 16 MG tablet Take 16 mg by mouth in the morning.      cholecalciferol (VITAMIN D) 25 MCG (1000 UNIT) tablet Take 1,000 Units by mouth in the morning.     Cyanocobalamin (VITAMIN B-12) 5000 MCG TBDP Take 5,000 mcg by mouth in the morning.     cyclobenzaprine (FLEXERIL) 10 MG tablet Take 10 mg by mouth at bedtime.     gabapentin (NEURONTIN) 100 MG capsule Take 200 mg by mouth with breakfast, with lunch, and with evening meal.     gabapentin (NEURONTIN) 300 MG capsule Take 300 mg by mouth at bedtime.     glipiZIDE (GLUCOTROL) 10 MG tablet Take 10 mg by mouth daily before breakfast.     HYDROcodone-acetaminophen (NORCO/VICODIN) 5-325 MG tablet Take 1 tablet by mouth every 6 (six) hours as needed for moderate pain.     Magnesium Oxide -Mg Supplement 500 MG TABS Take 1 tablet by mouth at bedtime.     meclizine (ANTIVERT) 25 MG tablet Take 25 mg by mouth 3 (three) times daily as needed for dizziness.     oxybutynin (DITROPAN) 5 MG tablet Take 1 tablet by mouth 2 (two) times daily.     rOPINIRole (REQUIP) 0.5 MG tablet Take 0.5 mg by mouth at bedtime.     Dulaglutide 1.5 MG/0.5ML SOPN Inject 1.5 mg into the skin every Saturday.      Results for orders placed or performed during the hospital encounter of 05/16/23 (from the past 48 hours)  Glucose, capillary     Status: Abnormal   Collection Time: 05/16/23 12:51 PM  Result Value Ref Range   Glucose-Capillary 154 (H) 70 - 99 mg/dL    Comment: Glucose reference range applies only to samples taken after fasting for at least 8 hours.   No results found.  Review of Systems A 10 point review of systems was performed and it is as noted above otherwise negative.  Blood pressure (!) 117/57, pulse 92, temperature 97.8 F (36.6 C), temperature source Temporal, resp. rate 20, height 5\' 3"  (1.6 m), weight 91.2 kg, SpO2 97%. Physical Exam  GENERAL: Obese woman, no acute distress, fully ambulatory.  No conversational dyspnea. HEAD: Normocephalic, atraumatic.  EYES: Pupils equal, round, reactive to light.  No  scleral icterus.  MOUTH: Poor dentition, some missing teeth, oral mucosa moist. NECK: Supple. No thyromegaly. Trachea midline. No JVD.  No adenopathy. PULMONARY: Good air entry bilaterally.  Symmetrical air entry.  No adventitious sounds. CARDIOVASCULAR: S1 and S2. Regular rate and rhythm.  No rubs, murmurs or gallops heard. ABDOMEN: Obese, otherwise benign. MUSCULOSKELETAL: No joint deformity, no clubbing, no edema.  NEUROLOGIC: No focal deficit, gait not tested, speech is fluent. SKIN: Intact,warm,dry.  Multiple ecchymotic areas on the upper extremities. PSYCH: Mood and behavior normal.   A chest x-ray was  done prior to the procedure and shows no pneumothorax.  There is still present some right sided pleural effusion.  The previous opacity noted on the right is also present.  Representative images from chest CT of 05/09/2023:      Assessment/Plan Mediastinal Adenopathy Mediastinal adenopathy noted on PET CT with a history of non-small cell lung cancer. Prominent lymph nodes in the mediastinum, particularly on the right side before the tracheal bifurcation. Differential includes inflammation versus malignancy. Discussed EBUS with biopsy, including procedure details, potential benefits, limitations and complications of the procedure. Emphasized importance of biopsy for accurate diagnosis. - Proceed with EBUS with biopsy today. - Patient has been off warfarin in anticipation for procedure - Follow ups will be arranged after procedure  Non-Small Cell Lung Cancer Treated with radiation in 2022. Current PET CT shows activity in previously treated areas, which is expected. Emphasized monitoring for recurrence or new symptoms and evaluating EBUS biopsy results to rule out malignancy. - Monitor for recurrence or new symptoms - Evaluate EBUS biopsy results to rule out malignancy  Right pleural effusion/pleuritic chest pain - Preprocedure chest x-ray shows no pneumothorax - Reaccumulation of  some of the right pleural fluid, not as marked as prior - Right lung opacity as prior - Noted to be exudative/eosinophilic may still be related to malignancy   Benefits, limitations and potential complications of the procedure were discussed with the patient/family.  Complications from bronchoscopy are rare and most often minor, but if they occur they may include breathing difficulty, vocal cord spasm, hoarseness, slight fever, vomiting, dizziness, bronchospasm, infection, low blood oxygen, bleeding from biopsy site, or an allergic reaction to medications.  It is uncommon for patients to experience other more serious complications for example: Collapsed lung requiring chest tube placement, respiratory failure, heart attack and/or cardiac arrhythmia.  Patient agrees to proceed.    Gailen Shelter, [name] Advanced Bronchoscopy PCCM [facility]    *This note was generated using voice recognition software/Dragon and/or AI transcription program.  Despite best efforts to proofread, errors can occur which can change the meaning. Any transcriptional errors that result from this process are unintentional and may not be fully corrected at the time of dictation.

## 2023-05-17 ENCOUNTER — Encounter: Payer: Self-pay | Admitting: Pulmonary Disease

## 2023-05-19 ENCOUNTER — Other Ambulatory Visit: Payer: Self-pay | Admitting: *Deleted

## 2023-05-19 ENCOUNTER — Telehealth: Payer: Self-pay | Admitting: *Deleted

## 2023-05-19 DIAGNOSIS — I10 Essential (primary) hypertension: Secondary | ICD-10-CM | POA: Diagnosis not present

## 2023-05-19 DIAGNOSIS — G8929 Other chronic pain: Secondary | ICD-10-CM | POA: Diagnosis not present

## 2023-05-19 DIAGNOSIS — Z09 Encounter for follow-up examination after completed treatment for conditions other than malignant neoplasm: Secondary | ICD-10-CM | POA: Diagnosis not present

## 2023-05-19 DIAGNOSIS — Z7901 Long term (current) use of anticoagulants: Secondary | ICD-10-CM | POA: Diagnosis not present

## 2023-05-19 DIAGNOSIS — E1165 Type 2 diabetes mellitus with hyperglycemia: Secondary | ICD-10-CM | POA: Diagnosis not present

## 2023-05-19 DIAGNOSIS — M5441 Lumbago with sciatica, right side: Secondary | ICD-10-CM | POA: Diagnosis not present

## 2023-05-19 DIAGNOSIS — C3490 Malignant neoplasm of unspecified part of unspecified bronchus or lung: Secondary | ICD-10-CM

## 2023-05-19 DIAGNOSIS — Z85118 Personal history of other malignant neoplasm of bronchus and lung: Secondary | ICD-10-CM

## 2023-05-19 DIAGNOSIS — G894 Chronic pain syndrome: Secondary | ICD-10-CM | POA: Diagnosis not present

## 2023-05-19 DIAGNOSIS — R590 Localized enlarged lymph nodes: Secondary | ICD-10-CM

## 2023-05-19 DIAGNOSIS — J90 Pleural effusion, not elsewhere classified: Secondary | ICD-10-CM | POA: Diagnosis not present

## 2023-05-19 LAB — CYTOLOGY - NON PAP

## 2023-05-19 NOTE — Telephone Encounter (Signed)
I called the patient house number and left a message about if pt can come tom. Am at 8:30. She called back and said she cannot come that time. She has pt's to take to appts on tues and Friday. I called her back and said that the appt can be 1/2 at 2:30 . Pt called right back and said that is good for her. She knows how to get here at cancer center

## 2023-05-20 DIAGNOSIS — I10 Essential (primary) hypertension: Secondary | ICD-10-CM | POA: Diagnosis not present

## 2023-05-20 DIAGNOSIS — E1165 Type 2 diabetes mellitus with hyperglycemia: Secondary | ICD-10-CM | POA: Diagnosis not present

## 2023-05-20 DIAGNOSIS — M1611 Unilateral primary osteoarthritis, right hip: Secondary | ICD-10-CM | POA: Diagnosis not present

## 2023-05-26 ENCOUNTER — Encounter: Payer: Self-pay | Admitting: Internal Medicine

## 2023-05-26 ENCOUNTER — Encounter: Payer: Self-pay | Admitting: *Deleted

## 2023-05-26 ENCOUNTER — Inpatient Hospital Stay: Payer: PPO | Attending: Oncology | Admitting: Internal Medicine

## 2023-05-26 VITALS — BP 113/64 | HR 90 | Temp 98.3°F | Resp 16 | Wt 206.0 lb

## 2023-05-26 DIAGNOSIS — Z7722 Contact with and (suspected) exposure to environmental tobacco smoke (acute) (chronic): Secondary | ICD-10-CM | POA: Insufficient documentation

## 2023-05-26 DIAGNOSIS — C779 Secondary and unspecified malignant neoplasm of lymph node, unspecified: Secondary | ICD-10-CM | POA: Insufficient documentation

## 2023-05-26 DIAGNOSIS — C3490 Malignant neoplasm of unspecified part of unspecified bronchus or lung: Secondary | ICD-10-CM

## 2023-05-26 DIAGNOSIS — Z803 Family history of malignant neoplasm of breast: Secondary | ICD-10-CM | POA: Insufficient documentation

## 2023-05-26 DIAGNOSIS — C3411 Malignant neoplasm of upper lobe, right bronchus or lung: Secondary | ICD-10-CM | POA: Diagnosis not present

## 2023-05-26 DIAGNOSIS — Z7901 Long term (current) use of anticoagulants: Secondary | ICD-10-CM | POA: Diagnosis not present

## 2023-05-26 DIAGNOSIS — Z86711 Personal history of pulmonary embolism: Secondary | ICD-10-CM | POA: Diagnosis not present

## 2023-05-26 DIAGNOSIS — Z79899 Other long term (current) drug therapy: Secondary | ICD-10-CM | POA: Insufficient documentation

## 2023-05-26 NOTE — Progress Notes (Signed)
 Patient says that she gets out of breath real easy ever since fluid was removed from lungs about 2 months ago.

## 2023-05-26 NOTE — Progress Notes (Signed)
 Met with patient during follow up visit to discuss bronch results and treatment options. All questions answered during visit. Reviewed with patient upcoming appts. Pt stated that will need to call back to schedule so she can have her calendar available. Will follow up with pt at next clinic visit Mon 05/30/23. Contact info given and instructed to call with any questions or needs. Pt verbalized understanding. Nothing further needed at this time.

## 2023-05-27 ENCOUNTER — Encounter: Payer: Self-pay | Admitting: Internal Medicine

## 2023-05-27 ENCOUNTER — Telehealth: Payer: Self-pay

## 2023-05-27 ENCOUNTER — Encounter: Payer: Self-pay | Admitting: *Deleted

## 2023-05-27 ENCOUNTER — Ambulatory Visit: Payer: PPO | Admitting: Internal Medicine

## 2023-05-27 DIAGNOSIS — C3411 Malignant neoplasm of upper lobe, right bronchus or lung: Secondary | ICD-10-CM | POA: Insufficient documentation

## 2023-05-27 DIAGNOSIS — C3490 Malignant neoplasm of unspecified part of unspecified bronchus or lung: Secondary | ICD-10-CM | POA: Insufficient documentation

## 2023-05-27 MED ORDER — ONDANSETRON HCL 8 MG PO TABS: 8.0000 mg | ORAL_TABLET | Freq: Three times a day (TID) | ORAL | 1 refills | Status: DC | PRN

## 2023-05-27 MED ORDER — LIDOCAINE-PRILOCAINE 2.5-2.5 % EX CREA: TOPICAL_CREAM | CUTANEOUS | 3 refills | Status: DC

## 2023-05-27 MED ORDER — PROCHLORPERAZINE MALEATE 10 MG PO TABS: 10.0000 mg | ORAL_TABLET | Freq: Four times a day (QID) | ORAL | 1 refills | Status: DC | PRN

## 2023-05-27 MED ORDER — DEXAMETHASONE 4 MG PO TABS: ORAL_TABLET | ORAL | 1 refills | Status: DC

## 2023-05-27 NOTE — Telephone Encounter (Signed)
 Spoke with patient about her port placement time and date, which is on Tuesday Jan 21'st at 8:30 am with arrival time at 7:30 am. Patient agrees with the plan.

## 2023-05-27 NOTE — Progress Notes (Signed)
 Hematology/Oncology Consult note [facility]  Telephone:([phone] [phone]  Patient Care Team: Sadie Manna, MD as PCP - General (Internal Medicine) Verdene Gills, RN as Oncology Nurse Navigator Melanee Annah BROCKS, MD as Consulting Physician (Oncology)   Name of the patient: Kara [name]  [id number]  1950/08/14   Date of visit: 05/27/23  Diagnosis-  right upper lobe lung cancer s/p SBRT     Chief complaint/ Reason for visit-discuss pathology results.  Heme/Onc history: Patient is a 73 year old female underwent CT chest with contrast in May 2022 to follow-up on lung nodule that was incidentally noted on CT abdomen in March 2022 when she was admitted for COVID.  At that time she was noted to have a right lower lobe 1.3 x 1.1 cm right lower lobe nodule.  CT chest in May 2022 showed right upper lobe nodule measuring 1.4 cm which was also hypermetabolic on PET scan with an SUV of 3.3.  No other enlarged mediastinal or hilar lymph nodes or distant metastatic disease was noted.   CT scan in August 2022 showed mild increase in the size of the right upper lobe lung nodule to 1.8 cm.  No evidence of locoregional adenopathy.she underwent SBRT to in December 2022.   PET CT scan from 04/06/2023 showed 10 mm level 2 left cervical lymph node SUV 8.8, progressive since prior PET from 2022.  Small mediastinal lymph node more confluent than previous PET.  10 mm short axis precarinal node SUV 5.3.  Hypermetabolism seen in amorphous soft tissue changes in right hilum SUV max 6.4.  Hypermetabolic lymph nodes are seen in right hilum.  9 mm subcarinal node SUV 6.8. 15 x 12 mm nodular opacity at the dome of right diaphragm new since 12/25/2022.  Low activity 3.7 nonspecific.  Could be infectious/inflammatory or neoplastic. Clustered nodularity seen on 01/14/2023 exam in the medial right base shows no activity on PET.  S/p EBUS with Dr. Tamea On 05/16/2023.  Pathology  from level 7 lymph node positive for malignancy consistent with metastatic adenocarcinoma.  05/10/23 -right pleural fluid negative for malignancy.  Interval history-  Patient seen today follow-up for pathology report. Overall feeling okay.  Does report shortness of breath on exertion which is unchanged.  ECOG PS- 2 Pain scale- 0  Review of systems- Review of Systems  Constitutional:  Positive for malaise/fatigue. Negative for chills, fever and weight loss.  HENT:  Negative for congestion, ear discharge and nosebleeds.   Eyes:  Negative for blurred vision.  Respiratory:  Positive for shortness of breath. Negative for cough, hemoptysis, sputum production and wheezing.   Cardiovascular:  Negative for chest pain, palpitations, orthopnea and claudication.  Gastrointestinal:  Negative for abdominal pain, blood in stool, constipation, diarrhea, heartburn, melena, nausea and vomiting.  Genitourinary:  Negative for dysuria, flank pain, frequency, hematuria and urgency.  Musculoskeletal:  Negative for back pain, joint pain and myalgias.  Skin:  Negative for rash.  Neurological:  Negative for dizziness, tingling, focal weakness, seizures, weakness and headaches.  Endo/Heme/Allergies:  Does not bruise/bleed easily.  Psychiatric/Behavioral:  Negative for depression and suicidal ideas. The patient does not have insomnia.       Allergies  Allergen Reactions   Adhesive [Tape] Other (See Comments)    Pt reports, when removing tape from skin most tapes pull her skin off. Ok to use paper tape   Other Hives    Chlorhexadine wipes/CHG wipes,    Latex Rash    Exam gloves/ does  not react around elastic and lips don't swell when blowing balloons   Morphine  And Codeine  Nausea And Vomiting     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, 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   CHF (congestive heart failure) (HCC)    followed colon resection,  wouldn't let me get up   Chronic anticoagulation    Degenerative disc disease, lumbar    Diverticulitis 2018   Diverticulosis    DVT of lower extremity, bilateral (HCC) 2018   Essential hypertension    History of being hospitalized    1 month ago for 3 days, vomiting, and kink in upper intestine   History of hiatal hernia    Mediastinal adenopathy 04/2023   MRSA nasal colonization    Non-small cell lung cancer (HCC) 2022   right   Obesity (BMI 30-39.9)    Osteoarthritis    Osteoporosis    PE (pulmonary thromboembolism) (HCC) 2018   Pneumonia    Small bowel obstruction (HCC) 03/20/2023   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HCC)    Type 2 diabetes mellitus with obesity (HCC)    Vertigo      Past Surgical History:  Procedure Laterality Date   APPENDECTOMY N/A 02/24/2017   Procedure: Incidental  APPENDECTOMY;  Surgeon: Shelva Dunnings, MD;  Location: [name];  Service: General;  Laterality: N/A;   CARPAL TUNNEL RELEASE Bilateral    CATARACT EXTRACTION W/PHACO Left 11/21/2018   Procedure: CATARACT EXTRACTION PHACO AND INTRAOCULAR LENS PLACEMENT (IOC) LEFT DIABETES;  Surgeon: Jaye Fallow, MD;  Location: [facility];  Service: Ophthalmology;  Laterality: Left;  latex sensitivity Diabetic - oral meds   CATARACT EXTRACTION W/PHACO Right 12/12/2018   Procedure: CATARACT EXTRACTION PHACO AND INTRAOCULAR LENS PLACEMENT (IOC) RIGHT DIABETES;  Surgeon: Jaye Fallow, MD;  Location: [facility];  Service: Ophthalmology;  Laterality: Right;  Diabetes-oral med Latex sensitiviy   COLON RESECTION SIGMOID N/A 11/02/2016   Procedure: COLON RESECTION SIGMOID;  Surgeon: Shelva Dunnings, MD;  Location: [name];  Service: General;  Laterality: N/A;   COLON SURGERY  11/02/2016   COLONOSCOPY WITH PROPOFOL  N/A 02/03/2017   Procedure: COLONOSCOPY WITH PROPOFOL ;  Surgeon: Unk Corinn Skiff, MD;  Location: [name];  Service: Gastroenterology;  Laterality: N/A;    COLOSTOMY N/A 11/02/2016   Procedure: COLOSTOMY;  Surgeon: Shelva Dunnings, MD;  Location: [name];  Service: General;  Laterality: N/A;   COLOSTOMY REVERSAL N/A 02/24/2017   Procedure: COLOSTOMY REVERSAL, ostomy takedown, spleenic flexure mobilization, excision rectal stump/distal sigmoid, anastomosis with suture reinforcement;  Surgeon: Shelva Dunnings, MD;  Location: [name];  Service: General;  Laterality: N/A;   ENDOBRONCHIAL ULTRASOUND N/A 05/16/2023   Procedure: ENDOBRONCHIAL ULTRASOUND;  Surgeon: Tamea Dedra CROME, MD;  Location: [name];  Service: Cardiopulmonary;  Laterality: N/A;   FLEXIBLE BRONCHOSCOPY N/A 05/16/2023   Procedure: FLEXIBLE BRONCHOSCOPY;  Surgeon: Tamea Dedra CROME, MD;  Location: [name];  Service: Cardiopulmonary;  Laterality: N/A;   ILEO LOOP DIVERSION N/A 02/24/2017   Procedure: ILEO LOOP COLOSTOMY;  Surgeon: Shelva Dunnings, MD;  Location: [name];  Service: General;  Laterality: N/A;   ILEOSTOMY CLOSURE N/A 06/01/2017   Procedure: LOOP ILEOSTOMY TAKEDOWN;  Surgeon: Shelva Dunnings, MD;  Location: [name];  Service: General;  Laterality: N/A;   IVC FILTER INSERTION Right 10/2016   IVC FILTER REMOVAL N/A 08/09/2017   Procedure: IVC FILTER REMOVAL;  Surgeon: Jama Cordella MATSU, MD;  Location:  [name];  Service: Cardiovascular;  Laterality: N/A;   KNEE SURGERY Right    torn menicus   LAPAROSCOPY N/A 02/24/2017   Procedure: LAPAROSCOPY DIAGNOSTIC;  Surgeon: Shelva Dunnings, MD;  Location: [name];  Service: General;  Laterality: N/A;   LYSIS OF ADHESION N/A 02/24/2017   Procedure: LYSIS OF ADHESION;  Surgeon: Shelva Dunnings, MD;  Location: [name];  Service: General;  Laterality: N/A;   PULMONARY VENOGRAPHY N/A 11/19/2016   Procedure: Pulmonary Venography; IVC filter placement; possible pulmonary thrombectomy;  Surgeon: Jama Cordella MATSU, MD;  Location: [name];  Service: Cardiovascular;  Laterality: N/A;   SACROPLASTY N/A  11/08/2017   Procedure: SACROPLASTY S2;  Surgeon: Kathlynn Sharper, MD;  Location: [name];  Service: Orthopedics;  Laterality: N/A;   SIGMOIDOSCOPY N/A 11/13/2016   Procedure: endoscopic  flexible SIGMOIDOSCOPY;  Surgeon: Wonda Charlie BRAVO, MD;  Location: [name];  Service: General;  Laterality: N/A;   SIGMOIDOSCOPY N/A 05/19/2017   Procedure: ENID MORIN;  Surgeon: Shelva Dunnings, MD;  Location: [name];  Service: General;  Laterality: N/A;   VIDEO BRONCHOSCOPY WITH ENDOBRONCHIAL NAVIGATION N/A 02/23/2021   Procedure: ROBOTIC VIDEO BRONCHOSCOPY WITH ENDOBRONCHIAL NAVIGATION;  Surgeon: Tamea Dedra CROME, MD;  Location: [name];  Service: Pulmonary;  Laterality: N/A;    Social History   Socioeconomic History   Marital status: Married    Spouse name: Butler   Number of children: 1   Years of education: Not on file   Highest education level: Not on file  Occupational History   Not on file  Tobacco Use   Smoking status: Never    Passive exposure: Current (husband smokes)   Smokeless tobacco: Never  Vaping Use   Vaping status: Never Used  Substance and Sexual Activity   Alcohol  use: No    Alcohol /week: 0.0 standard drinks of alcohol    Drug use: No   Sexual activity: Never  Other Topics Concern   Not on file  Social History Narrative   Not on file   Social Drivers of Health   Financial Resource Strain: Low Risk  (05/19/2023)   Received from [facility]   Overall Financial Resource Strain (CARDIA)    Difficulty of Paying Living Expenses: Not hard at all  Food Insecurity: No Food Insecurity (05/19/2023)   Received from [facility]   Hunger Vital Sign    Worried About Running Out of Food in the Last Year: Never true    Ran Out of Food in the Last Year: Never true  Transportation Needs: No Transportation Needs (05/19/2023)   Received from [facility] - Transportation    In the past 12 months, has  lack of transportation kept you from medical appointments or from getting medications?: No    Lack of Transportation (Non-Medical): No  Physical Activity: Not on file  Stress: Not on file  Social Connections: Not on file  Intimate Partner Violence: Not At Risk (05/09/2023)   Humiliation, Afraid, Rape, and Kick questionnaire    Fear of Current or Ex-Partner: No    Emotionally Abused: No    Physically Abused: No    Sexually Abused: No    Family History  Problem Relation Age of Onset   Diabetes Mother    Heart disease Mother    Cancer Mother    Breast cancer Mother        >50   Heart disease Father    Diabetes Sister  Heart disease Sister      Current Outpatient Medications:    calcium  carbonate (CALCIUM  600) 600 MG TABS tablet, Take 1 tablet (600 mg total) by mouth 2 (two) times daily with a meal., Disp: 60 tablet, Rfl: 5   candesartan (ATACAND) 16 MG tablet, Take 16 mg by mouth in the morning., Disp: , Rfl:    cholecalciferol  (VITAMIN D ) 25 MCG (1000 UNIT) tablet, Take 1,000 Units by mouth in the morning., Disp: , Rfl:    Cyanocobalamin  (VITAMIN B-12) 5000 MCG TBDP, Take 5,000 mcg by mouth in the morning., Disp: , Rfl:    cyclobenzaprine  (FLEXERIL ) 10 MG tablet, Take 10 mg by mouth at bedtime., Disp: , Rfl:    dexamethasone  (DECADRON ) 4 MG tablet, Take 2 tablets daily for 2 days, start the day after chemotherapy. Take with food., Disp: 30 tablet, Rfl: 1   Dulaglutide 1.5 MG/0.5ML SOPN, Inject 1.5 mg into the skin every Saturday., Disp: , Rfl:    gabapentin  (NEURONTIN ) 100 MG capsule, Take 200 mg by mouth with breakfast, with lunch, and with evening meal., Disp: , Rfl:    gabapentin  (NEURONTIN ) 300 MG capsule, Take 300 mg by mouth at bedtime., Disp: , Rfl:    glipiZIDE  (GLUCOTROL ) 10 MG tablet, Take 10 mg by mouth daily before breakfast., Disp: , Rfl:    HYDROcodone -acetaminophen  (NORCO/VICODIN) 5-325 MG tablet, Take 1 tablet by mouth every 6 (six) hours as needed for moderate  pain., Disp: , Rfl:    lidocaine -prilocaine  (EMLA ) cream, Apply to affected area once, Disp: 30 g, Rfl: 3   Magnesium  Oxide -Mg Supplement 500 MG TABS, Take 1 tablet by mouth at bedtime., Disp: , Rfl:    meclizine (ANTIVERT) 25 MG tablet, Take 25 mg by mouth 3 (three) times daily as needed for dizziness., Disp: , Rfl:    ondansetron  (ZOFRAN ) 8 MG tablet, Take 1 tablet (8 mg total) by mouth every 8 (eight) hours as needed for nausea or vomiting. Start on the third day after chemotherapy., Disp: 30 tablet, Rfl: 1   oxybutynin  (DITROPAN ) 5 MG tablet, Take 1 tablet by mouth 2 (two) times daily., Disp: , Rfl:    prochlorperazine  (COMPAZINE ) 10 MG tablet, Take 1 tablet (10 mg total) by mouth every 6 (six) hours as needed for nausea or vomiting., Disp: 30 tablet, Rfl: 1   rOPINIRole  (REQUIP ) 0.5 MG tablet, Take 0.5 mg by mouth at bedtime., Disp: , Rfl:   Physical exam:  Vitals:   05/26/23 1405  BP: 113/64  Pulse: 90  Resp: 16  Temp: 98.3 F (36.8 C)  TempSrc: Tympanic  SpO2: 96%  Weight: 206 lb (93.4 kg)   Physical Exam Cardiovascular:     Rate and Rhythm: Normal rate and regular rhythm.     Heart sounds: Normal heart sounds.  Pulmonary:     Effort: Pulmonary effort is normal.     Breath sounds: Normal breath sounds.  Skin:    General: Skin is warm and dry.  Neurological:     Mental Status: She is alert and oriented to person, place, and time.         Latest Ref Rng & Units 05/10/2023    4:26 AM  CMP  Glucose 70 - 99 mg/dL 860   BUN 8 - 23 mg/dL 15   Creatinine 9.55 - 1.00 mg/dL 9.25   Sodium 864 - 854 mmol/L 135   Potassium 3.5 - 5.1 mmol/L 3.5   Chloride 98 - 111 mmol/L 101   CO2 22 -  32 mmol/L 25   Calcium  8.9 - 10.3 mg/dL 8.3   Total Protein 6.5 - 8.1 g/dL 7.1   Total Bilirubin <8.7 mg/dL 0.5   Alkaline Phos 38 - 126 U/L 38   AST 15 - 41 U/L 16   ALT 0 - 44 U/L 14       Latest Ref Rng & Units 05/10/2023    4:26 AM  CBC  WBC 4.0 - 10.5 K/uL 6.0   Hemoglobin 12.0  - 15.0 g/dL 87.5   Hematocrit 63.9 - 46.0 % 38.1   Platelets 150 - 400 K/uL 314     No images are attached to the encounter.  DG Chest Port 1 View Result Date: 05/16/2023 CLINICAL DATA:  Status post bronchoscopy. EXAM: PORTABLE CHEST 1 VIEW COMPARISON:  Chest radiograph dated 05/16/2023. FINDINGS: Shallow inspiration with bibasilar atelectasis. Right mid lung field opacity as seen previously. No pleural effusion or pneumothorax. Stable cardiac silhouette. No acute osseous pathology. IMPRESSION: 1. No pneumothorax. 2. Right mid lung field opacity as seen previously. Electronically Signed   By: Vanetta Chou M.D.   On: 05/16/2023 16:23   DG Chest Port 1 View Result Date: 05/16/2023 CLINICAL DATA:  [id number] Breath sounds, abnormal [id number] EXAM: PORTABLE CHEST 1 VIEW COMPARISON:  05/10/2023 chest radiograph. FINDINGS: Stable cardiomediastinal silhouette with top-normal heart size. No pneumothorax. Small right pleural effusion is similar. No left pleural effusion. Chronic hazy bandlike right mid lung opacity. No superimposed acute consolidative airspace disease. IMPRESSION: 1. Small right pleural effusion, similar. 2. Chronic hazy bandlike right mid lung opacity, favor post treatment scarring. 3. No superimposed acute consolidative airspace disease. Electronically Signed   By: Selinda DELENA Blue M.D.   On: 05/16/2023 13:51   DG Chest Port 1 View Result Date: 05/10/2023 CLINICAL DATA:  Status post right thoracentesis. EXAM: PORTABLE CHEST 1 VIEW COMPARISON:  05/09/2023 and chest CT 05/09/2023 FINDINGS: Improved aeration in the right lower chest compatible with recent thoracentesis. No evidence for pneumothorax. Again noted are streaky densities in the right mid lung. Left lung remains clear. Heart and mediastinum are within normal limits and stable. Atherosclerotic calcifications at the aortic arch. IMPRESSION: 1. Improved aeration in the right lower chest compatible with recent thoracentesis. No evidence for  pneumothorax. 2. Persistent streaky densities in the right mid lung region. Electronically Signed   By: Juliene Balder M.D.   On: 05/10/2023 16:18   US  THORACENTESIS ASP PLEURAL SPACE W/IMG GUIDE Result Date: 05/10/2023 INDICATION: 73 yo female with a history of lung cancer s/p radiation, now with chest pain, pulmonary edema, and loculated right pleural effusion. IRR requested for diagnostic and therapeutic right thoracentesis. EXAM: ULTRASOUND GUIDED DIAGNOSTIC AND THERAPEUTIC THORACENTESIS MEDICATIONS: 9 cc of 1% lidocaine  COMPLICATIONS: None immediate. PROCEDURE: An ultrasound guided thoracentesis was thoroughly discussed with the patient and questions answered. The benefits, risks, alternatives and complications were also discussed. The patient understands and wishes to proceed with the procedure. Written consent was obtained. Ultrasound was performed to localize and mark an adequate pocket of fluid in the right chest. The area was then prepped and draped in the normal sterile fashion. 1% Lidocaine  was used for local anesthesia. Under ultrasound guidance a 6 Fr Safe-T-Centesis catheter was introduced. Thoracentesis was performed. The catheter was removed and a dressing applied. FINDINGS: A total of approximately 700 cc of dark red pleural fluid was removed. Samples were sent to the laboratory as requested by the clinical team. IMPRESSION: Successful ultrasound guided right thoracentesis yielding 700 cc of pleural fluid.  Performed by Carlin Griffon, PA-C. Electronically Signed   By: Juliene Balder M.D.   On: 05/10/2023 16:03   CT Angio Chest Pulmonary Embolism (PE) W or WO Contrast Result Date: 05/09/2023 CLINICAL DATA:  Chest pain for the past 1-2 days with increased tachypnea. Chest radiographic findings concerning for right lung pneumonia or atelectasis. Clinical concern for pulmonary embolism. EXAM: CT ANGIOGRAPHY CHEST WITH CONTRAST TECHNIQUE: Multidetector CT imaging of the chest was performed using the  standard protocol during bolus administration of intravenous contrast. Multiplanar CT image reconstructions and MIPs were obtained to evaluate the vascular anatomy. RADIATION DOSE REDUCTION: This exam was performed according to the departmental dose-optimization program which includes automated exposure control, adjustment of the mA and/or kV according to patient size and/or use of iterative reconstruction technique. CONTRAST:  75mL OMNIPAQUE  IOHEXOL  350 MG/ML SOLN COMPARISON:  Portable chest obtained earlier today. Chest CTA dated 03/20/2023. PET-CT dated 04/06/2023. FINDINGS: Cardiovascular: Normally opacified pulmonary arteries with no pulmonary arterial filling defects seen. Borderline enlarged heart. No pericardial effusion. Atheromatous calcifications, including the coronary arteries and aorta. Mediastinum/Nodes: Multiple mildly enlarged and borderline enlarged mediastinal lymph nodes with mild progression. These a were re previously hypermetabolic, suspicious for metastatic nodes. Node the largest has a short axis diameter of 9 mm on image number 47/4. Unremarkable thyroid  gland. Interval flattening of the AP diameter of the trachea and mainstem bronchi, compatible with tracheomalacia. Unremarkable esophagus. Lungs/Pleura: Interval moderate-sized, loculated right pleural effusion. Increased size of the previously demonstrated amorphous soft tissue tissue/treatment related scarring in the right hilar and perihilar region with a more mass-like appearance today, measuring 6.2 x 5.2 cm on image number 60/4. This was previously hypermetabolic and highly suspicious for recurrent malignancy. This is causing right upper lobe branch compression and occlusion. Small amount of compressive right lower lobe atelectasis. Patchy ground-glass opacities throughout the left lung. No left pleural fluid seen. Upper Abdomen: Multiple gallstones in the gallbladder without gallbladder wall thickening or pericholecystic fluid. The  largest stone measures 9.8 mm. The largest stone measures 9.6 mm. Musculoskeletal: Thoracic and lower cervical spine degenerative changes with changes of DISH. No evidence of bony metastatic disease. Review of the MIP images confirms the above findings. IMPRESSION: 1. No pulmonary embolism. 2. Interval moderate-sized, loculated right pleural effusion. 3. Increased size of the previously demonstrated amorphous soft tissue/treatment related scarring in the right hilar and perihilar region with a more mass-like appearance today, measuring 6.2 x 5.2 cm. This was previously hypermetabolic and highly suspicious for recurrent malignancy. This is causing right upper lobe branch compression and occlusion. 4. Small amount of compressive right lower lobe atelectasis. 5. Patchy ground-glass opacities throughout the left lung, possibly representing interstitial pulmonary edema or an infectious or inflammatory process. 6. Mild progression of multiple mildly enlarged and borderline enlarged mediastinal lymph nodes, suspicious for metastatic adenopathy. 7. Interval flattening of the AP diameter of the trachea and mainstem bronchi, compatible with tracheomalacia. 8. Cholelithiasis. 9. Minimal calcific coronary artery and mild calcific aortic atherosclerosis. Aortic Atherosclerosis (ICD10-I70.0). Electronically Signed   By: Elspeth Bathe M.D.   On: 05/09/2023 17:01   DG Chest Port 1 View Result Date: 05/09/2023 CLINICAL DATA:  Shortness of breath.  History of lung cancer. EXAM: PORTABLE CHEST 1 VIEW COMPARISON:  PET-CT dated April 06, 2023. Chest x-ray dated March 21, 2023. FINDINGS: The heart size and mediastinal contours are within normal limits. Unchanged right perihilar nodular thickening along the fissures. Increasing right pleural effusion with worsening right lower lobe aeration. No pneumothorax. No  acute osseous abnormality. IMPRESSION: 1. Increasing right pleural effusion with worsening right lower lobe atelectasis  versus infiltrate. 2. Unchanged right perihilar nodular thickening along the fissures. Electronically Signed   By: Elsie ONEIDA Shoulder M.D.   On: 05/09/2023 13:31     Assessment and plan- Patient is a 73 y.o. adult here for right upper lobe lung cancer status post SBRT now with recurrent right lung adenocarcinoma s/p EBUS with biopsy of level 7 lymph node positive for lung adenocarcinoma.  PET CT scan showing mediastinal and right hilar adenopathy.  Hypermetabolism seen in amorphous soft tissue changes in right hilum SUV max 6.4.  Hypermetabolic lymph nodes are seen in right hilum.  9 mm subcarinal node SUV 6.8. 15 x 12 mm nodular opacity at the dome of right diaphragm new since 12/25/2022.  Low activity 3.7 nonspecific.  Could be infectious/inflammatory or neoplastic. Clustered nodularity seen on 01/14/2023 exam in the medial right base shows no activity on PET.  There is also 10 mm left level 2 cervical lymph node progressive from prior PET in 2022 SUV of 8.8.  S/p EBUS with biopsy of station 7 lymph node positive for metastatic adenocarcinoma.  Unclear if the left level 2 cervical lymph node is a part of the process.  Last PET/CT scan was from November 2024.  It is close to 2 months old will get an updated PET.  I discussed the case with Dr. Lenn.  Patient does have history of prior SBRT to right upper lung nodule.  She is still a candidate for further radiation treatment.  Considering the majority of disease is in the chest currently the plan is to treat with concurrent chemo RT.  Weekly CarboTaxol regimen was discussed with the patient.  Side effects such as but not limited to decreased blood count, increased risk of infection, weakness, nausea, renal dysfunction was discussed.  Discussed that this is low-dose of chemo and is tolerable.  She was hesitant about addition of chemotherapy but eventually agreed with the plan.  Will schedule for port placement.  Briefly discussed further possibility of  maintenance immunotherapy versus targeted therapy depending on Tempus testing.  Patient was overall overwhelmed with the information.  She is scheduled to see Dr. Lenn on Monday.  She will also be added to the tumor conference for radiology review.  Insufficient tissue sample for Tempus testing.  Will send for liquid Tempus.  Declined brain MRI.  Will schedule for CT head with contrast to rule out any metastatic disease.   Visit Diagnosis 1. Cancer of upper lobe of right lung (HCC)

## 2023-05-27 NOTE — Patient Instructions (Signed)
 PA submitted on Cover My Meds for zofran and EMLA cream.

## 2023-05-27 NOTE — Progress Notes (Signed)
 START OFF PATHWAY REGIMEN - Non-Small Cell Lung   [id number]:$MzfnczAzqnmzIZPI_bzYIq[id number]$$MzfnczAzqnmzIZPI_bzYIqZzwLJKZbeACOIfUEgvnpZaEDXYl$  + Paclitaxel  (2/50) + RT weekly x 6 weeks:   Administer weekly during RT:     Paclitaxel       Carboplatin    **Always confirm dose/schedule in your pharmacy ordering system**  Patient Characteristics: Local Recurrence Therapeutic Status: Local Recurrence Intent of Therapy: Curative Intent, Not Discussed with Patient

## 2023-05-29 ENCOUNTER — Other Ambulatory Visit: Payer: Self-pay

## 2023-05-30 ENCOUNTER — Ambulatory Visit
Admission: RE | Admit: 2023-05-30 | Discharge: 2023-05-30 | Disposition: A | Discharge status: Home / Self Care | Payer: PPO | Source: Ambulatory Visit | Attending: Radiation Oncology | Admitting: Radiation Oncology

## 2023-05-30 ENCOUNTER — Inpatient Hospital Stay: Payer: PPO

## 2023-05-30 ENCOUNTER — Encounter: Payer: Self-pay | Admitting: *Deleted

## 2023-05-30 VITALS — BP 111/62 | HR 98 | Temp 97.0°F | Resp 18 | Ht 63.0 in | Wt 203.9 lb

## 2023-05-30 DIAGNOSIS — C3411 Malignant neoplasm of upper lobe, right bronchus or lung: Secondary | ICD-10-CM | POA: Insufficient documentation

## 2023-05-30 NOTE — Progress Notes (Signed)
 Radiation Oncology Follow up Note old patient new area recurrent lung cancer  Name: Kara [name]   Date:   05/30/2023 MRN:  [id number] DOB: 1950/12/31    This 73 y.o. 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  REFERRING PROVIDER: Sadie Manna, MD  HPI: Patient is a 73 year old female well-known to our department having been treated back in 2022 with SBRT for a non-small cell lung cancer of the right upper lobe.  She was recently mid to the hospital for symptoms of abdominal pain and chest pain at which time.  She was noted to have a right pleural effusion also increased size of previous demonstrated amorphous soft tissue scarring the right hilar perihilar region which was more masslike in appearance back in December.  PET scan in November also showed hypermetabolic left cervical mediastinal and right hilar lymph nodes progressive since her prior PET CT scan.  Again was noted hypermetabolic activity in Morphis soft tissue treatment related to the scarring of the right hilar region concerning for recurrent disease.  There was also a 1.5 nodular lung opacity at the dome of the right hemidiaphragm new since August 24 showing low-level FDG accumulation.  There is no evidence of metastatic disease in the abdomen or pelvis.  Patient complains of dyspnea on exertion and shortness of breath she is having no significant cough or hemoptysis at this time.  She is now referred radiation oncology for consideration of concurrent treatment.  COMPLICATIONS OF TREATMENT: none  FOLLOW UP COMPLIANCE: keeps appointments   PHYSICAL EXAM:  BP 111/62   Pulse 98   Temp (!) 97 F (36.1 C)   Resp 18   Ht 5' 3 (1.6 m)   Wt 203 lb 14.4 oz (92.5 kg)   BMI 36.12 kg/m  Obese female in NAD.  Well-developed well-nourished patient in NAD. HEENT reveals PERLA, EOMI, discs  not visualized.  Oral cavity is clear. No oral mucosal lesions are identified. Neck is clear without evidence of cervical or supraclavicular adenopathy. Lungs are clear to A&P. Cardiac examination is essentially unremarkable with regular rate and rhythm without murmur rub or thrill. Abdomen is benign with no organomegaly or masses noted. Motor sensory and DTR levels are equal and symmetric in the upper and lower extremities. Cranial nerves II through XII are grossly intact. Proprioception is intact. No peripheral adenopathy or edema is identified. No motor or sensory levels are noted. Crude visual fields are within normal range.  RADIOLOGY RESULTS: Serial CT scans and PET CT scans reviewed new PET CT scan and brain imaging will be performed later this week  PLAN: At this time I have question the validity of this high left cervical node being metastatic disease from lung which would be highly unusual.  Besides this node she could be treated as a stage IIIb with concurrent chemotherapy.  I have gone over the risks and benefits of treatment including creased shortness of breath cough possible radiation esophagitis causing dysphagia fatigue alteration blood counts skin reaction all were described in detail to the patient.  She will have further imaging including repeat PET CT scan this week as well as brain imaging which I will review when it becomes available.  I have also discussed with medical oncology treating this as stage IIIb disease.  Should she have progressive disease in the high cervical node could always treat that  with SBRT as oligometastatic disease.  Patient comprehends my recommendations well.  Case will be presented at weekly tumor conference.  I would like to take this opportunity to thank you for allowing me to participate in the care of your patient.SABRA Marcey Penton, MD

## 2023-05-30 NOTE — Progress Notes (Signed)
 Met with patient during initial consult with Dr. Lenn. All questions answered during visit. Reviewed upcoming appts. Pt escorted to lab to collect blood sample for Tempus xF+ liquid biopsy. Financial assistance application completed and submitted online. Pt approved for $0 copay. Sample sent via [employer]. Nothing further needed at this time. Instructed pt to call with any questions or needs. Pt verbalized understanding.

## 2023-05-31 ENCOUNTER — Other Ambulatory Visit: Payer: Self-pay | Admitting: Radiation Oncology

## 2023-05-31 DIAGNOSIS — C801 Malignant (primary) neoplasm, unspecified: Secondary | ICD-10-CM

## 2023-06-02 ENCOUNTER — Other Ambulatory Visit: Payer: PPO

## 2023-06-03 ENCOUNTER — Ambulatory Visit
Admission: RE | Admit: 2023-06-03 | Discharge: 2023-06-03 | Disposition: A | Discharge status: Home / Self Care | Payer: PPO | Source: Ambulatory Visit | Attending: Internal Medicine

## 2023-06-03 ENCOUNTER — Other Ambulatory Visit: Payer: Self-pay | Admitting: *Deleted

## 2023-06-03 DIAGNOSIS — C3490 Malignant neoplasm of unspecified part of unspecified bronchus or lung: Secondary | ICD-10-CM

## 2023-06-03 DIAGNOSIS — I6523 Occlusion and stenosis of bilateral carotid arteries: Secondary | ICD-10-CM | POA: Diagnosis not present

## 2023-06-03 DIAGNOSIS — C3411 Malignant neoplasm of upper lobe, right bronchus or lung: Secondary | ICD-10-CM | POA: Insufficient documentation

## 2023-06-03 DIAGNOSIS — C3491 Malignant neoplasm of unspecified part of right bronchus or lung: Secondary | ICD-10-CM | POA: Diagnosis not present

## 2023-06-03 LAB — GLUCOSE, CAPILLARY: Glucose-Capillary: 142 mg/dL — ABNORMAL HIGH (ref 70–99)

## 2023-06-03 MED ORDER — IOHEXOL 300 MG/ML  SOLN
75.0000 mL | Freq: Once | INTRAMUSCULAR | Status: AC | PRN
  Administered 2023-06-03: 75 mL via INTRAVENOUS

## 2023-06-03 MED ORDER — FLUDEOXYGLUCOSE F - 18 (FDG) INJECTION
10.8900 | Freq: Once | INTRAVENOUS | Status: AC | PRN
  Administered 2023-06-03: 10.89 via INTRAVENOUS

## 2023-06-03 NOTE — Progress Notes (Signed)
 Pt does not need te med, she does not have a portacath

## 2023-06-06 ENCOUNTER — Other Ambulatory Visit: Payer: PPO

## 2023-06-07 ENCOUNTER — Inpatient Hospital Stay: Payer: PPO

## 2023-06-07 ENCOUNTER — Telehealth: Payer: Self-pay

## 2023-06-07 NOTE — Progress Notes (Signed)
 [facility] Clinical Social Work  Clinical Social Work was referred by nurse for assessment of psychosocial needs.  Clinical Social Worker contacted patient by phone to offer support and assess for needs.  Patient expressed no needs at this time.     Macario CHRISTELLA Au, LCSW  Clinical Social Worker [facility]

## 2023-06-07 NOTE — Telephone Encounter (Signed)
 Clinical Social Work was referred by medical provider for assessment of psychosocial needs.  CSW attempted to contact patient by phone.  Left voicemail with contact information and request for return call.

## 2023-06-09 ENCOUNTER — Ambulatory Visit: Payer: PPO

## 2023-06-09 ENCOUNTER — Other Ambulatory Visit: Payer: PPO

## 2023-06-09 ENCOUNTER — Inpatient Hospital Stay: Payer: PPO

## 2023-06-09 NOTE — Progress Notes (Unsigned)
PROVIDER NOTE: Information contained herein reflects review and annotations entered in association with encounter. Interpretation of such information and data should be left to medically-trained personnel. Information provided to patient can be located elsewhere in the medical record under "Patient Instructions". Document created using STT-dictation technology, any transcriptional errors that may result from process are unintentional.    Patient: Anselm Lis Kronenberger  Service Category: E/M  Provider: Oswaldo Done, MD  DOB: 1950-12-08  DOS: 06/13/2023  Referring Provider: Barbette Reichmann, MD  MRN: [id number]  Specialty: Interventional Pain Management  PCP: Barbette Reichmann, MD  Type: Established Patient  Setting: Ambulatory outpatient    Location: Office  Delivery: Face-to-face     HPI  Ms. Leightyn Rodriguez [place], a 73 y.o. year old adult, is here today because of her Chronic bilateral low back pain without sciatica [M54.50, G89.29]. Ms. Bosque primary complain today is Back Pain and Leg Pain (Right )  Pertinent problems: Ms. 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); Lumbar pars defect (L3 and L4) (Bilateral); Diabetic peripheral neuropathy (HCC); Chronic lower extremity pain (2ry area of Pain) (Bilateral); Chronic radicular pain of lower extremity; Osteoarthritis of hip (Right); Chronic low back pain (1ry area of Pain) (Bilateral) w/o sciatica;  Spondylosis without myelopathy or radiculopathy, lumbosacral region; Other specified dorsopathies, sacral and sacrococcygeal region; Secondary osteoarthritis of multiple sites; Sacral insufficiency fracture; DDD (degenerative disc disease), lumbosacral; Coccygodynia; Chronic sacroiliac joint pain (Left); Osteoarthritis of lumbar spine; Spondylolisthesis, lumbosacral region; Abnormal MRI, lumbar spine (08/09/2019); Spasm of muscle of lower back; Chronic hip pain (Bilateral) (R>L); Osteoarthritis of hips (Bilateral); Greater trochanteric bursitis (Right); Chronic hip pain (Right); Chronic low back pain (Right) w/o sciatica; Greater trochanteric bursitis (Left); Other bursitis of hip, not elsewhere classified (gluteus medius bursa) (Left); Lumbar facet joint pain; Piriformis muscle pain (Right); Abdominal pain; Chest pain in adult; Chest pain; Mediastinal adenopathy; Cancer of upper lobe of right lung (HCC); and Recurrent non-small cell lung cancer (NSCLC) (HCC) on their pertinent problem list. Pain Assessment: Severity of Chronic pain is reported as a 8 /10. Location: Back Lower, Right, Left/Radiates from lower back into right leg into the knee. Onset: More than a month ago. Quality: Aching, Constant, Shooting, Dull. Timing: Constant. Modifying factor(s): Resting. Vitals:  height is 5\' 3"  (1.6 m) and weight is 203 lb (92.1 kg). Her temporal temperature is 98.1 F (36.7 C). Her blood pressure is 109/67 and her pulse is 93. Her respiration is 16 and oxygen saturation is 97%.  BMI: Estimated body mass index is 35.96 kg/m as calculated from the following:   Height as of this encounter: 5\' 3"  (1.6 m).   Weight as of this encounter: 203 lb (92.1 kg). Last encounter: 04/07/2023. Last procedure: 03/15/2023.  Reason for encounter: evaluation of worsening, or previously known (established) problem.  The patient has requested this appointment to talk about repeating her lower back injections for recurrence of her low  back pain.  In review of the electronic medical record, I see that there is a new diagnosis of right lung upper lobe cancer entered on 05/27/2023.  According  to the notes, the patient apparently has no evidence of metastatic adenocarcinoma of the lung.  Discussed the use of AI scribe software for clinical note transcription with the patient, who gave verbal consent to proceed.  History of Present Illness   The patient presents with a recurrence of lung cancer, previously treated with radiation two years ago. She reports experiencing pain in the right lung area, which was found to be due to fluid accumulation. The fluid was drained, providing some relief, but the procedure was described as painful. The patient denies any history of smoking and suggests a possible familial predisposition to cancer, as her mother had both breast and lung cancer.  The patient underwent a CT and PET scan, which confirmed the recurrence of the cancer. The patient expresses concern about potential side effects of chemotherapy, as she has not previously undergone this treatment.  In addition to the cancer, the patient reports experiencing difficulty breathing, which she attributes to the cancer. She describes having to rest after doing a small amount of work around the house. The patient also has a history of diabetes, which she believes to be inherited from her mother.      Pharmacotherapy Assessment  Analgesic: No chronic opioid analgesics therapy prescribed by our practice, 2ry to Medication Agreement Violation. Oxycodone IR 10 mg, 1 tab PO qd,  from PCP.  (Getting oxycodone from PCP in multiple occasions, according to PMP)(11/21/20; 12/26/20; 01/16/21; 02/13/21). Opioid analgesic pharmacotherapy D/C'ed on 03/03/2021. MME: 20 mg/day.   Monitoring: [place] PMP: PDMP reviewed during this encounter.       Pharmacotherapy: No side-effects or adverse reactions reported. Compliance: No problems identified. Effectiveness: Clinically  acceptable.  Earlyne Iba, RN  06/13/2023  2:36 PM  Sign when Signing Visit Safety precautions to be maintained throughout the outpatient stay will include: orient to surroundings, keep bed in low position, maintain call bell within reach at all times, provide assistance with transfer out of bed and ambulation.     No results found for: "CBDTHCR" No results found for: "D8THCCBX" No results found for: "D9THCCBX"  UDS:  Summary  Date Value Ref Range Status  11/12/2020 Note  Final    Comment:    ==================================================================== [employer] (MW) ==================================================================== Test                             Result       Flag       Units  Drug Present and Declared for Prescription Verification   Norhydrocodone                 61           EXPECTED   ng/mg creat    Norhydrocodone is an expected metabolite of hydrocodone.    Tramadol                       >4202        EXPECTED   ng/mg creat   O-Desmethyltramadol            >4202        EXPECTED   ng/mg creat   N-Desmethyltramadol            1712         EXPECTED   ng/mg creat    Source of tramadol is a prescription medication. O-desmethyltramadol    and N-desmethyltramadol are expected metabolites of tramadol.  Drug Absent but  Declared for Prescription Verification   Hydrocodone                    Not Detected UNEXPECTED ng/mg creat    Hydrocodone is almost always present in patients taking this drug    consistently. Absence of hydrocodone could be due to lapse of time    since the last dose or unusual pharmacokinetics (rapid metabolism).  ==================================================================== Test                      Result    Flag   Units      Ref Range   Creatinine              119              mg/dL      >=40 ==================================================================== Declared Medications:  The flagging and interpretation on  this report are based on the  following declared medications.  Unexpected results may arise from  inaccuracies in the declared medications.   **Note: The testing scope of this panel includes these medications:   Hydrocodone  Tramadol   **Note: The testing scope of this panel does not include the  following reported medications:   Acetaminophen  Alendronate (Fosamax)  Aspirin  Calcium  Candesartan  Cholecalciferol  Cyanocobalamin  Cyclobenzaprine  Dulaglutide  Gabapentin  Glipizide  Meclizine  Multivitamin  Nystatin  Ondansetron  Ropinirole  Tolterodine  Warfarin ==================================================================== For clinical consultation, please call [phone]. ====================================================================       ROS  Constitutional: Denies any fever or chills Gastrointestinal: No reported hemesis, hematochezia, vomiting, or acute GI distress Musculoskeletal: Denies any acute onset joint swelling, redness, loss of ROM, or weakness Neurological: No reported episodes of acute onset apraxia, aphasia, dysarthria, agnosia, amnesia, paralysis, loss of coordination, or loss of consciousness  Medication Review  Dulaglutide, HYDROcodone-acetaminophen, Magnesium Oxide -Mg Supplement, Vitamin B-12, calcium carbonate, candesartan, cholecalciferol, cyclobenzaprine, dexamethasone, gabapentin, glipiZIDE, glucose blood, meclizine, ondansetron, oxybutynin, prochlorperazine, rOPINIRole, and warfarin  History Review  Allergy: Ms. Lana is allergic to adhesive [tape], other, latex, and morphine and codeine. Drug: Ms. Haroldsen  reports no history of drug use. Alcohol:  reports no history of alcohol use. Tobacco:  reports that she has never smoked. She has been exposed to tobacco smoke. She has never used smokeless tobacco. Social: Ms. Warburton  reports that she has never smoked. She has been exposed to tobacco smoke. She has never used  smokeless tobacco. She reports that she does not drink alcohol and does not use drugs. Medical:  has a past medical history of Anginal pain (HCC), Arthritis, CHF (congestive heart failure) (HCC), Chronic anticoagulation, Degenerative disc disease, lumbar, Diverticulitis (2018), Diverticulosis, DVT of lower extremity, bilateral (HCC) (2018), Essential hypertension, History of being hospitalized, History of hiatal hernia, Mediastinal adenopathy (04/2023), MRSA nasal colonization, Non-small cell lung cancer (HCC) (2022), Obesity (BMI 30-39.9), Osteoarthritis, Osteoporosis, PE (pulmonary thromboembolism) (HCC) (2018), Pneumonia, Small bowel obstruction (HCC) (03/20/2023), 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HCC), Type 2 diabetes mellitus with obesity (HCC), and Vertigo. Surgical: Ms. Hertzberg  has a past surgical history that includes Knee surgery (Right); Carpal tunnel release (Bilateral); Colon resection sigmoid (N/A, 11/02/2016); Colostomy (N/A, 11/02/2016); Sigmoidoscopy (N/A, 11/13/2016); PULMONARY VENOGRAPHY (N/A, 11/19/2016); Colonoscopy with propofol (N/A, 02/03/2017); Colon surgery (11/02/2016); IVC FILTER INSERTION (Right, 10/2016); Colostomy reversal (N/A, 02/24/2017); Appendectomy (N/A, 02/24/2017); Lysis of adhesion (N/A, 02/24/2017); laparoscopy (N/A, 02/24/2017); Ileo loop diversion (N/A, 02/24/2017); Sigmoidoscopy (N/A, 05/19/2017); Ileostomy closure (N/A, 06/01/2017); IVC FILTER REMOVAL (  N/A, 08/09/2017); Sacroplasty (N/A, 11/08/2017); Cataract extraction w/PHACO (Left, 11/21/2018); Cataract extraction w/PHACO (Right, 12/12/2018); Video bronchoscopy with endobronchial navigation (N/A, 02/23/2021); Flexible bronchoscopy (N/A, 05/16/2023); and Endobronchial ultrasound (N/A, 05/16/2023). Family: family history includes Breast cancer in her mother; Cancer in her mother; Diabetes in her mother and sister; Heart disease in her father, mother, and sister.  Laboratory Chemistry Profile   Renal Lab Results   Component Value Date   BUN 15 05/10/2023   CREATININE 0.74 05/10/2023   GFRAA >60 07/09/2019   GFRNONAA >60 05/10/2023    Hepatic Lab Results  Component Value Date   AST 16 05/10/2023   ALT 14 05/10/2023   ALBUMIN 3.2 (L) 05/10/2023   ALKPHOS 38 05/10/2023   LIPASE 34 03/20/2023    Electrolytes Lab Results  Component Value Date   NA 135 05/10/2023   K 3.5 05/10/2023   CL 101 05/10/2023   CALCIUM 8.3 (L) 05/10/2023   MG 2.1 03/22/2023   PHOS 4.0 03/22/2023    Bone Lab Results  Component Value Date   25OHVITD1 25 (L) 11/02/2017   25OHVITD2 <1.0 11/02/2017   25OHVITD3 25 11/02/2017    Inflammation (CRP: Acute Phase) (ESR: Chronic Phase) Lab Results  Component Value Date   CRP 1.7 (H) 08/15/2020   ESRSEDRATE 12 11/02/2017   LATICACIDVEN 1.7 11/21/2021         Note: Above Lab results reviewed.  Recent Imaging Review  NM PET Image Restag (PS) Skull Base To Thigh CLINICAL DATA:  Subsequent treatment strategy for lung cancer.  EXAM: NUCLEAR MEDICINE PET SKULL BASE TO THIGH  TECHNIQUE: 10.9 mCi F-18 FDG was injected intravenously. Full-ring PET imaging was performed from the skull base to thigh after the radiotracer. CT data was obtained and used for attenuation correction and anatomic localization.  Fasting blood glucose: 142 mg/dl  COMPARISON:  CT chest 05/09/2023, PET 04/06/2023.  FINDINGS: Mediastinal blood pool activity: SUV max 3.1  Liver activity: SUV max NA  NECK:  10 mm left level II lymph node (6/17), unchanged. Hypermetabolism in this region appears to be associated with the styloid process, rather than the lymph node. 5 mm left parotid nodule, SUV max 3.0 similar. No additional abnormal hypermetabolism.  Incidental CT findings:  None.  CHEST:  Increasingly hypermetabolic mediastinal right hilar and subcarinal lymph nodes. Index subcarinal lymph node measures 11 mm SUV max 9.0 compared to 6.8 previously. Right perihilar masslike  consolidation and hypermetabolism, SUV max 9.1, previously 6.4.  Incidental CT findings:  Atherosclerotic calcification of the aorta. Heart is enlarged. No pericardial effusion. Thin rind of loculated right pleural fluid, decreased from 05/09/2023. Masslike consolidation in the perihilar right lung, as discussed above.  ABDOMEN/PELVIS:  Scattered small mesenteric lymph nodes do not show metabolism above blood pool. Subcutaneous 7 mm nodule along the ventral right abdominal wall, at the level of the umbilicus (6/91), SUV max 2.5, similar and nonspecific. No additional abnormal hypermetabolism.  Incidental CT findings:  Gallstones. Umbilical hernia contains unobstructed colon. Postoperative changes in the distal small bowel. Cystocele.  SKELETON:  No abnormal hypermetabolism.  Incidental CT findings:  Bilateral L5 pars defects with grade 1 anterolisthesis L5 on S1. Grade 1 anterolisthesis of L4 on L5. Degenerative changes in the spine.  IMPRESSION: 1. Increasingly hypermetabolic mediastinal and right hilar lymph nodes as well as masslike consolidation in the perihilar right lung, compatible with disease progression. 2. 5 mm hypermetabolic left parotid nodule. Malignancy cannot be excluded. 3. Thin rind of residual loculated pleural fluid in the right  hemithorax, decreased. 4. Cholelithiasis. 5. Umbilical hernia contains unobstructed colon. 6. Cystocele. 7. Grade 1 anterolisthesis of L4 on L5 and L5 on S1. 8.  Aortic atherosclerosis (ICD10-I70.0).  Electronically Signed   By: Leanna Battles M.D.   On: 06/13/2023 14:27 CT HEAD W & WO CONTRAST ( ) CLINICAL DATA:  Staging of lung carcinoma  EXAM: CT HEAD WITHOUT AND WITH CONTRAST  TECHNIQUE: Contiguous axial images were obtained from the base of the skull through the vertex without and with intravenous contrast.  RADIATION DOSE REDUCTION: This exam was performed according to the departmental dose-optimization  program which includes automated exposure control, adjustment of the mA and/or kV according to patient size and/or use of iterative reconstruction technique.  CONTRAST:  75mL OMNIPAQUE IOHEXOL 300 MG/ML  SOLN  COMPARISON:  11/19/2021  FINDINGS: Brain: There is no mass, hemorrhage or extra-axial collection. The size and configuration of the ventricles and extra-axial CSF spaces are normal. There is hypoattenuation of the white matter, most commonly indicating chronic small vessel disease. There is no abnormal contrast enhancement.  Vascular: Atherosclerotic calcification of the internal carotid arteries at the skull base. No abnormal hyperdensity of the major intracranial arteries or dural venous sinuses.  Skull: The visualized skull base, calvarium and extracranial soft tissues are normal.  Sinuses/Orbits: No fluid levels or advanced mucosal thickening of the visualized paranasal sinuses. No mastoid or middle ear effusion. Normal orbits.  Other: None.  IMPRESSION: 1. No acute intracranial abnormality or evidence of intracranial metastatic disease. 2. Chronic small vessel disease.  Electronically Signed   By: Deatra Robinson M.D.   On: 06/13/2023 03:23 Note: Reviewed        Physical Exam  General appearance: Well nourished, well developed, and well hydrated. In no apparent acute distress Mental status: Alert, oriented x 3 (person, place, & time)       Respiratory: No evidence of acute respiratory distress Eyes: PERLA Vitals: BP 109/67 (Cuff Size: Normal)   Pulse 93   Temp 98.1 F (36.7 C) (Temporal)   Resp 16   Ht 5\' 3"  (1.6 m)   Wt 203 lb (92.1 kg)   SpO2 97%   BMI 35.96 kg/m  BMI: Estimated body mass index is 35.96 kg/m as calculated from the following:   Height as of this encounter: 5\' 3"  (1.6 m).   Weight as of this encounter: 203 lb (92.1 kg). Ideal: Ideal body weight: 52.4 kg (115 lb 8.3 oz) Adjusted ideal body weight: 68.3 kg (150 lb 8.2 oz)  Assessment    Diagnosis Status  1. Chronic low back pain (1ry area of Pain) (Bilateral) w/o sciatica   2. Lumbar facet arthropathy (L3-4, L4-5, and L5-S1) (Bilateral)   3. 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 Osteoarthritis of lumbar spine   5. Lumbar facet joint pain   6. Lumbar facet syndrome (Bilateral)   7. 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 Spondylosis without myelopathy or radiculopathy, lumbosacral region   9. Chronic pain syndrome   10. Chronic anticoagulation (COUMADIN)    Recurring Recurring Persistent   Updated Problems: No problems updated.  Plan of Care  Problem-specific:  Assessment and Plan    Lung Cancer Recurrent lung cancer confirmed by CT and PET scans. She experiences chest pain and dyspnea, especially with exertion. Previously underwent radiation therapy two years ago. Current treatment involves low-dose chemotherapy and radiation for six weeks. She is apprehensive about chemotherapy due to inexperience. Informed consent  was given, acknowledging risks such as immunosuppression and side effects, with potential benefits including tumor size reduction and symptom relief. Alternative treatments were discussed, emphasizing adherence to the regimen for optimal outcomes. Plan includes inserting a port for chemotherapy and radiation, administering low-dose chemotherapy and radiation for six weeks, and coordinating with the oncology team for steroid shot timing.  Pleural Effusion Right-sided pleural effusion, previously drained with significant pain. Fluid volume was approximately one liter. Informed consent covered risks and benefits, including pain and potential complications. Monitor for recurrence and educate on signs of pleural effusion and when to seek medical attention.  Diabetes Mellitus Diabetes mellitus inherited from mother. No changes in management were discussed. Continue current diabetes management regimen.  General  Health Maintenance Emphasized the importance of vaccinations and general health maintenance in the context of the current treatment plan. Ensure vaccinations are up to date, especially due to immunosuppressive therapy, and discuss general health maintenance with the oncology team.  Follow-up Attend port insertion appointment at 7:30 AM. Follow up with the oncology team regarding start dates for chemotherapy and radiation, and confirm timing of steroid shots.       Ms. Towanna Koelbl Warchol has a current medication list which includes the following long-term medication(s): candesartan, glipizide, prochlorperazine, ropinirole, and calcium carbonate.  Pharmacotherapy (Medications Ordered): No orders of the defined types were placed in this encounter.  Orders:  Orders Placed This Encounter  Procedures   LUMBAR FACET(MEDIAL BRANCH NERVE BLOCK) MBNB    Diagnosis: Lumbar Facet Syndrome (M47.816); Lumbosacral Facet Syndrome (M47.817); Lumbar Facet Joint Pain (M54.59) Medical Necessity Statement: 1.Severe chronic axial low back pain causing functional impairment documented by ongoing pain scale assessments. 2.Pain present for longer than 3 months (Chronic) documented to have failed noninvasive conservative therapies. 3.Absence of untreated radiculopathy. 4.There is no radiological evidence of untreated fractures, tumor, infection, or deformity.  Physical Examination Findings: Positive Kemp Maneuver: (Y)  Positive Lumbar Hyperextension-Rotation provocative test: (Y)    Standing Status:   Future    Expiration Date:   09/07/2023    Scheduling Instructions:     Procedure: Lumbar facet Block     Type: Medial Branch Block     Side: Bilateral     Purpose: Palliative     Level(s): L3-4, L4-5, L5-S1, and TBD by Fluoroscopic Mapping Facets (L2, L3, L4, L5, S1, and TBD Medial Branch)     Sedation: With Sedation.     Timeframe: ASAA    Where will this procedure be performed?:   [name]    Blood Thinner Instructions to Nursing    Always make sure patient has clearance from prescribing physician to stop blood thinners for interventional therapies. If the patient requires a Lovenox-bridge therapy, make sure arrangements are made to institute it with the assistance of the PCP.    Scheduling Instructions:     Have Ms. Keating stop the Coumadin (Warfarin) X 5 days prior to procedure or surgery.   Follow-up plan:   Return for (ECT): (B) L-FCT Blk R29L22, (Blood Thinner Protocol).      Interventional Therapies  Risk  Complexity Considerations:   ANTICOAGULATION: Coumadin (Stop: 5 days  Re-start: 2hrs)  Hx DVT & PE ALLERGY: Latex Morbid obesity (class III) (BMI>40)  metabolic syndrome  NIDDM  HTN Difficult Lumbar RFA due to morbid obesity    Planned  Pending: Palliative bilateral lumbar facet MBB [id number]    Under consideration:   Therapeutic bilateral lumbar facet RFA #2 (patient indicates preferring to have only the  block without radiofrequency.)   Completed:   Therapeutic bilateral lumbar facet MBB [id number] (03/15/2023) (25/25/50/50)  Diagnostic/therapeutic right IA hip inj. x2 (01/28/2022) (100/100/85/85)  Diagnostic/therapeutic left IA hip inj. x1 (01/28/2022) (100/100/85/85)  Diagnostic/therapeutic right trochanteric bursa inj. x1 (09/15/2021) (85/85/85)  Diagnostic/therapeutic left trochanteric bursa inj. x1 (01/28/2022) (100/100/85/85)  Therapeutic left lumbar facet RFA x1 (07/21/2021) (100/100/100 x1 week/80) (Lasted 9 months)  Therapeutic right lumbar facet RFA x1 (07/07/2021) (100/100/80/80) (Lasted 9 months)  Palliative/Therapeutic left L4-5 LESI x1 (11/10/2017) (100/80/0/0-25)  Diagnostic/therapeutic right L4 TFESI x1 (11/19/2021) (90/80/0/0) (fell at [employer]) Palliative right SI joint block x9 (01/28/2022) (100/100/80/80)  Diagnostic left SI joint block x2 (01/28/2022) (100/100/80/80)  Palliative/Therapeutic (Midline) caudal ESI x3 (06/08/2018) (100/100/100 x  2-3 days/0)    Therapeutic  Palliative (PRN) options:   NO PRN procedures without F2F exam by Dr. Laban Emperor due to multifactorial etiology of chronic pain. No more procedures until after 08/14/2022.    Pharmacotherapy  Nonopioids transferred 03/27/2020: Calcium       Recent Visits Date Type Provider Dept  04/07/23 Office Visit Delano Metz, MD [name]  03/15/23 Procedure visit Delano Metz, MD [name]  Showing recent visits within past 90 days and meeting all other requirements Today's Visits Date Type Provider Dept  06/13/23 Office Visit Delano Metz, MD [name]  Showing today's visits and meeting all other requirements Future Appointments No visits were found meeting these conditions. Showing future appointments within next 90 days and meeting all other requirements  I discussed the assessment and treatment plan with the patient. The patient was provided an opportunity to ask questions and all were answered. The patient agreed with the plan and demonstrated an understanding of the instructions.  Patient advised to call back or seek an in-person evaluation if the symptoms or condition worsens.  Duration of encounter: 30 minutes.  Total time on encounter, as per AMA guidelines included both the face-to-face and non-face-to-face time personally spent by the physician and/or other qualified health care professional(s) on the day of the encounter (includes time in activities that require the physician or other qualified health care professional and does not include time in activities normally performed by clinical staff). Physician's time may include the following activities when performed: Preparing to see the patient (e.g., pre-charting review of records, searching for previously ordered imaging, lab work, and nerve conduction tests) Review of prior analgesic pharmacotherapies. Reviewing PMP Interpreting ordered tests (e.g.,  lab work, imaging, nerve conduction tests) Performing post-procedure evaluations, including interpretation of diagnostic procedures Obtaining and/or reviewing separately obtained history Performing a medically appropriate examination and/or evaluation Counseling and educating the patient/family/caregiver Ordering medications, tests, or procedures Referring and communicating with other health care professionals (when not separately reported) Documenting clinical information in the electronic or other health record Independently interpreting results (not separately reported) and communicating results to the patient/ family/caregiver Care coordination (not separately reported)  Note by: Oswaldo Done, MD Date: 06/13/2023; Time: 2:50 PM

## 2023-06-12 ENCOUNTER — Encounter: Payer: Self-pay | Admitting: Radiology

## 2023-06-12 NOTE — H&P (Signed)
Chief Complaint: NSCLC RUL. In need of durable IV access for Chemotherapy. Team is requesting portacath placement   Referring Physician(s): Agrawal,Kavita  Supervising Physician: {Supervising Physician:[id number]}  Patient Status: [name] - Out-pt  History of Present Illness: Kara [name] is a 73 y.o. adult outpatient. History of DM, CHF, SBO s/p hartman's pouch, OA, PE, RUL non small cell lung cancer  s/p SBRT 12.22. Restaging PET scan from 1.10.25 showed mediastinal adenopathy and increase activity and area of consolidation from the prior radiation therapy. Pathology fro EBUS performed on 12.23.24 resulted in level 7 lymph node metastatic adenocarcinoma. Team is requesting a portacath for chemotherapy access.    Currently without any significant complaints. Patient alert and laying in bed,calm. Denies any fevers, headache, chest pain, SOB, cough, abdominal pain, nausea, vomiting or bleeding.    No line history per EPIC. PET scan from 1.10.25. No recent labs. All medications are within acceptable parameters.   Return precautions and treatment recommendations and follow-up discussed with the patient *** who is agreeable with the plan.    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, 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   CHF (congestive heart failure) (HCC)    followed colon resection, "wouldn't let me get up"   Chronic anticoagulation    Degenerative disc disease, lumbar    Diverticulitis 2018   Diverticulosis    DVT of lower extremity, bilateral (HCC) 2018   Essential hypertension    History of being hospitalized    1 month ago for 3 days, vomiting, and kink in upper intestine   History of hiatal hernia    Mediastinal adenopathy 04/2023   MRSA nasal colonization    Non-small cell lung cancer (HCC) 2022   right   Obesity (BMI 30-39.9)    Osteoarthritis    Osteoporosis    PE (pulmonary thromboembolism) (HCC) 2018    Pneumonia    Small bowel obstruction (HCC) 03/20/2023   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HCC)    Type 2 diabetes mellitus with obesity (HCC)    Vertigo     Past Surgical History:  Procedure Laterality Date   APPENDECTOMY N/A 02/24/2017   Procedure: Incidental  APPENDECTOMY;  Surgeon: Ricarda Frame, MD;  Location: [name];  Service: General;  Laterality: N/A;   CARPAL TUNNEL RELEASE Bilateral    CATARACT EXTRACTION W/PHACO Left 11/21/2018   Procedure: CATARACT EXTRACTION PHACO AND INTRAOCULAR LENS PLACEMENT (IOC) LEFT DIABETES;  Surgeon: Galen Manila, MD;  Location: [facility];  Service: Ophthalmology;  Laterality: Left;  latex sensitivity Diabetic - oral meds   CATARACT EXTRACTION W/PHACO Right 12/12/2018   Procedure: CATARACT EXTRACTION PHACO AND INTRAOCULAR LENS PLACEMENT (IOC) RIGHT DIABETES;  Surgeon: Galen Manila, MD;  Location: [facility];  Service: Ophthalmology;  Laterality: Right;  Diabetes-oral med Latex sensitiviy   COLON RESECTION SIGMOID N/A 11/02/2016   Procedure: COLON RESECTION SIGMOID;  Surgeon: Ricarda Frame, MD;  Location: [name];  Service: General;  Laterality: N/A;   COLON SURGERY  11/02/2016   COLONOSCOPY WITH PROPOFOL N/A 02/03/2017   Procedure: COLONOSCOPY WITH PROPOFOL;  Surgeon: Toney Reil, MD;  Location: [name];  Service: Gastroenterology;  Laterality: N/A;   COLOSTOMY N/A 11/02/2016   Procedure: COLOSTOMY;  Surgeon: Ricarda Frame, MD;  Location: [name];  Service: General;  Laterality: N/A;   COLOSTOMY REVERSAL N/A 02/24/2017   Procedure: COLOSTOMY REVERSAL, ostomy takedown, spleenic flexure mobilization, excision rectal stump/distal  sigmoid, anastomosis with suture reinforcement;  Surgeon: Ricarda Frame, MD;  Location: [name];  Service: General;  Laterality: N/A;   ENDOBRONCHIAL ULTRASOUND N/A 05/16/2023   Procedure: ENDOBRONCHIAL ULTRASOUND;  Surgeon: [name], MD;  Location: [name];   Service: Cardiopulmonary;  Laterality: N/A;   FLEXIBLE BRONCHOSCOPY N/A 05/16/2023   Procedure: FLEXIBLE BRONCHOSCOPY;  Surgeon: [name], MD;  Location: [name];  Service: Cardiopulmonary;  Laterality: N/A;   ILEO LOOP DIVERSION N/A 02/24/2017   Procedure: ILEO LOOP COLOSTOMY;  Surgeon: Ricarda Frame, MD;  Location: [name];  Service: General;  Laterality: N/A;   ILEOSTOMY CLOSURE N/A 06/01/2017   Procedure: LOOP ILEOSTOMY TAKEDOWN;  Surgeon: Ricarda Frame, MD;  Location: [name];  Service: General;  Laterality: N/A;   IVC FILTER INSERTION Right 10/2016   IVC FILTER REMOVAL N/A 08/09/2017   Procedure: IVC FILTER REMOVAL;  Surgeon: Renford Dills, MD;  Location: [name];  Service: Cardiovascular;  Laterality: N/A;   KNEE SURGERY Right    torn menicus   LAPAROSCOPY N/A 02/24/2017   Procedure: LAPAROSCOPY DIAGNOSTIC;  Surgeon: Ricarda Frame, MD;  Location: [name];  Service: General;  Laterality: N/A;   LYSIS OF ADHESION N/A 02/24/2017   Procedure: LYSIS OF ADHESION;  Surgeon: Ricarda Frame, MD;  Location: [name];  Service: General;  Laterality: N/A;   PULMONARY VENOGRAPHY N/A 11/19/2016   Procedure: Pulmonary Venography; IVC filter placement; possible pulmonary thrombectomy;  Surgeon: Renford Dills, MD;  Location: [name];  Service: Cardiovascular;  Laterality: N/A;   SACROPLASTY N/A 11/08/2017   Procedure: SACROPLASTY S2;  Surgeon: Kennedy Bucker, MD;  Location: [name];  Service: Orthopedics;  Laterality: N/A;   SIGMOIDOSCOPY N/A 11/13/2016   Procedure: endoscopic  flexible SIGMOIDOSCOPY;  Surgeon: Lattie Haw, MD;  Location: [name];  Service: General;  Laterality: N/A;   SIGMOIDOSCOPY N/A 05/19/2017   Procedure: Arnell Sieving;  Surgeon: Ricarda Frame, MD;  Location: [name];  Service: General;  Laterality: N/A;   VIDEO BRONCHOSCOPY WITH ENDOBRONCHIAL NAVIGATION N/A 02/23/2021   Procedure: ROBOTIC VIDEO  BRONCHOSCOPY WITH ENDOBRONCHIAL NAVIGATION;  Surgeon: [name], MD;  Location: [name];  Service: Pulmonary;  Laterality: N/A;    Allergies: Adhesive [tape], Other, Latex, and Morphine and codeine  Medications: Prior to Admission medications   Medication Sig Start Date End Date Taking? Authorizing Provider  calcium carbonate (CALCIUM 600) 600 MG TABS tablet Take 1 tablet (600 mg total) by mouth 2 (two) times daily with a meal. 02/25/20 05/16/23  Delano Metz, MD  candesartan (ATACAND) 16 MG tablet Take 16 mg by mouth in the morning. 08/03/17   [provider]  cholecalciferol (VITAMIN D) 25 MCG (1000 UNIT) tablet Take 1,000 Units by mouth in the morning.    [provider]  Cyanocobalamin (VITAMIN B-12) 5000 MCG TBDP Take 5,000 mcg by mouth in the morning.    [provider]  cyclobenzaprine (FLEXERIL) 10 MG tablet Take 10 mg by mouth at bedtime. 07/05/16   [provider]  dexamethasone (DECADRON) 4 MG tablet Take 2 tablets daily for 2 days, start the day after chemotherapy. Take with food. 05/27/23   Michaelyn Barter, MD  Dulaglutide 1.5 MG/0.5ML SOPN Inject 1.5 mg into the skin every Saturday.    [provider]  gabapentin (NEURONTIN) 100 MG capsule Take 200 mg by mouth with breakfast, with lunch, and with evening meal.    [provider]  gabapentin (NEURONTIN) 300 MG capsule Take 300  mg by mouth at bedtime.    [provider]  glipiZIDE (GLUCOTROL) 10 MG tablet Take 10 mg by mouth daily before breakfast.    [provider]  HYDROcodone-acetaminophen (NORCO/VICODIN) 5-325 MG tablet Take 1 tablet by mouth every 6 (six) hours as needed for moderate pain.    [provider]  Magnesium Oxide -Mg Supplement 500 MG TABS Take 1 tablet by mouth at bedtime.    [provider]  meclizine (ANTIVERT) 25 MG tablet Take 25 mg by mouth 3 (three) times daily as needed for dizziness. 12/29/17   [provider]  ondansetron (ZOFRAN) 8 MG tablet Take 1 tablet (8 mg total) by mouth every 8 (eight) hours as needed for nausea or vomiting. Start on the third day after chemotherapy. 05/27/23   Michaelyn Barter, MD  oxybutynin (DITROPAN) 5 MG tablet Take 1 tablet by mouth 2 (two) times daily. 02/25/22   [provider]  prochlorperazine (COMPAZINE) 10 MG tablet Take 1 tablet (10 mg total) by mouth every 6 (six) hours as needed for nausea or vomiting. 05/27/23   Michaelyn Barter, MD  rOPINIRole (REQUIP) 0.5 MG tablet Take 0.5 mg by mouth at bedtime.    [provider]     Family History  Problem Relation Age of Onset   Diabetes Mother    Heart disease Mother    Cancer Mother    Breast cancer Mother        >50   Heart disease Father    Diabetes Sister    Heart disease Sister     Social History   Socioeconomic History   Marital status: Married    Spouse name: Broadus John   Number of children: 1   Years of education: Not on file   Highest education level: Not on file  Occupational History   Not on file  Tobacco Use   Smoking status: Never    Passive exposure: Current (husband smokes)   Smokeless tobacco: Never  Vaping Use   Vaping status: Never Used  Substance and Sexual Activity   Alcohol use: No    Alcohol/week: 0.0 standard drinks of alcohol   Drug use: No   Sexual activity: Never  Other Topics Concern   Not on file  Social History Narrative   Not on file   Social Drivers of Health   Financial Resource Strain: Low Risk  (05/19/2023)   Received [facility]   Overall Financial Resource Strain (CARDIA)    Difficulty of Paying Living Expenses: Not hard at all  Food Insecurity: No Food Insecurity (05/19/2023)   Received from [facility]   Hunger Vital Sign    Worried About Running Out of Food in the Last Year: Never true    Ran Out of Food in the Last Year: Never true  Transportation Needs: No Transportation Needs  (05/19/2023)   Received from [facility] - Transportation    In the past 12 months, has lack of transportation kept you from medical appointments or from getting medications?: No    Lack of Transportation (Non-Medical): No  Physical Activity: Not on file  Stress: Not on file  Social Connections: Not on file    ECOG Status: {CHL ONC ECOG ZO:[id number]}  Review of Systems: A 12 point ROS discussed and pertinent positives are indicated in the HPI above.  All other systems are negative.  Review of Systems  Vital Signs: There were no vitals taken for  this visit.  Advance Care Plan: {Advance Care ZOXW:96045}    Physical Exam  Imaging: DG Chest Port 1 View Result Date: 05/16/2023 CLINICAL DATA:  Status post bronchoscopy. EXAM: PORTABLE CHEST 1 VIEW COMPARISON:  Chest radiograph dated 05/16/2023. FINDINGS: Shallow inspiration with bibasilar atelectasis. Right mid lung field opacity as seen previously. No pleural effusion or pneumothorax. Stable cardiac silhouette. No acute osseous pathology. IMPRESSION: 1. No pneumothorax. 2. Right mid lung field opacity as seen previously. Electronically Signed   By: Elgie Collard M.D.   On: 05/16/2023 16:23   DG Chest Port 1 View Result Date: 05/16/2023 CLINICAL DATA:  [id number] Breath sounds, abnormal [id number] EXAM: PORTABLE CHEST 1 VIEW COMPARISON:  05/10/2023 chest radiograph. FINDINGS: Stable cardiomediastinal silhouette with top-normal heart size. No pneumothorax. Small right pleural effusion is similar. No left pleural effusion. Chronic hazy bandlike right mid lung opacity. No superimposed acute consolidative airspace disease. IMPRESSION: 1. Small right pleural effusion, similar. 2. Chronic hazy bandlike right mid lung opacity, favor post treatment scarring. 3. No superimposed acute consolidative airspace disease. Electronically Signed   By: Delbert Phenix M.D.   On: 05/16/2023 13:51    Labs:  CBC: Recent Labs     03/21/23 0520 03/22/23 0231 05/09/23 1252 05/10/23 0426  WBC 9.9 8.4 6.6 6.0  HGB 13.1 12.9 12.4 12.4  HCT 39.5 38.7 38.6 38.1  PLT 263 226 273 314    COAGS: Recent Labs    03/21/23 1305 03/22/23 0231 05/09/23 1252 05/10/23 0426  INR 1.3* 1.3* 1.1 1.1  APTT 40*  --   --   --     BMP: Recent Labs    03/21/23 0520 03/22/23 0231 05/09/23 1252 05/10/23 0426  NA 137 135 135 135  K 3.8 3.9 3.9 3.5  CL 106 106 102 101  CO2 23 20* 23 25  GLUCOSE 192* 175* 144* 139*  BUN 17 17 9 15   CALCIUM 8.7* 8.3* 8.6* 8.3*  CREATININE 0.70 0.67 0.69 0.74  GFRNONAA >60 >60 >60 >60    LIVER FUNCTION TESTS: Recent Labs    03/20/23 1910 03/21/23 0520 05/09/23 1252 05/10/23 0426  BILITOT 1.2 0.8 0.5 0.5  AST 21 18 16 16   ALT 31 26 14 14   ALKPHOS 50 45 38 38  PROT 7.7 7.1 6.9 7.1  ALBUMIN 4.1 3.7 3.3* 3.2*    Assessment and Plan:  73 y.o. female outpatient. History of DM, CHF, SBO s/p hartman's pouch, OA, PE, RUL non small cell lung cancer  s/p SBRT 12.22. Restaging PET scan from 1.10.25 showed mediastinal adenopathy and increase activity and area of consolidation from the prior radiation therapy. Pathology fro EBUS performed on 12.23.24 resulted in level 7 lymph node metastatic adenocarcinoma. Team is requesting a portacath for chemotherapy access.    PLAN: IR Image Guided Portacath Placement  Risks and benefits of image guided port-a-catheter placement was discussed with the patient including, but not limited to bleeding, infection, pneumothorax, or fibrin sheath development and need for additional procedures.  All of the patient's questions were answered, patient is agreeable to proceed. Consent signed and in chart.   Thank you for this interesting consult.  I greatly enjoyed meeting [employer] and look forward to participating in their care.  A copy of this report was sent to the requesting provider on this date.  Electronically Signed: Alene Mires,  NP 06/12/2023, 8:18 PM   I spent a total of {New BJYN:[id number]} {New Out-Pt:[id number]}  {Established Out-Pt:[id number]} in face to face in  clinical consultation, greater than 50% of which was counseling/coordinating care for ***

## 2023-06-13 ENCOUNTER — Ambulatory Visit (HOSPITAL_BASED_OUTPATIENT_CLINIC_OR_DEPARTMENT_OTHER): Payer: PPO | Admitting: Pain Medicine

## 2023-06-13 ENCOUNTER — Other Ambulatory Visit: Payer: Self-pay | Admitting: Radiology

## 2023-06-13 ENCOUNTER — Encounter: Payer: Self-pay | Admitting: Pain Medicine

## 2023-06-13 ENCOUNTER — Telehealth: Payer: Self-pay | Admitting: *Deleted

## 2023-06-13 VITALS — BP 109/67 | HR 93 | Temp 98.1°F | Resp 16 | Ht 63.0 in | Wt 203.0 lb

## 2023-06-13 DIAGNOSIS — G894 Chronic pain syndrome: Secondary | ICD-10-CM | POA: Diagnosis not present

## 2023-06-13 DIAGNOSIS — M4316 Spondylolisthesis, lumbar region: Secondary | ICD-10-CM | POA: Diagnosis not present

## 2023-06-13 DIAGNOSIS — M47817 Spondylosis without myelopathy or radiculopathy, lumbosacral region: Secondary | ICD-10-CM

## 2023-06-13 DIAGNOSIS — Z7722 Contact with and (suspected) exposure to environmental tobacco smoke (acute) (chronic): Secondary | ICD-10-CM | POA: Diagnosis not present

## 2023-06-13 DIAGNOSIS — Z8249 Family history of ischemic heart disease and other diseases of the circulatory system: Secondary | ICD-10-CM | POA: Diagnosis not present

## 2023-06-13 DIAGNOSIS — Z7901 Long term (current) use of anticoagulants: Secondary | ICD-10-CM | POA: Diagnosis not present

## 2023-06-13 DIAGNOSIS — I509 Heart failure, unspecified: Secondary | ICD-10-CM | POA: Diagnosis not present

## 2023-06-13 DIAGNOSIS — M4306 Spondylolysis, lumbar region: Secondary | ICD-10-CM

## 2023-06-13 DIAGNOSIS — M47816 Spondylosis without myelopathy or radiculopathy, lumbar region: Secondary | ICD-10-CM

## 2023-06-13 DIAGNOSIS — I110 Hypertensive heart disease with heart failure: Secondary | ICD-10-CM | POA: Diagnosis not present

## 2023-06-13 DIAGNOSIS — Z7984 Long term (current) use of oral hypoglycemic drugs: Secondary | ICD-10-CM | POA: Diagnosis not present

## 2023-06-13 DIAGNOSIS — M5459 Other low back pain: Secondary | ICD-10-CM

## 2023-06-13 DIAGNOSIS — M4310 Spondylolisthesis, site unspecified: Secondary | ICD-10-CM

## 2023-06-13 DIAGNOSIS — M545 Low back pain, unspecified: Secondary | ICD-10-CM

## 2023-06-13 DIAGNOSIS — C3411 Malignant neoplasm of upper lobe, right bronchus or lung: Secondary | ICD-10-CM | POA: Diagnosis not present

## 2023-06-13 NOTE — Telephone Encounter (Signed)
Patient called saying that she talk to the pain management Dr. Laban Emperor.  I spoke to Dr. Smith Robert and she says it is fine for her to get an injection in her back.  I called and left the patient a message that Dr. Smith Robert did say that it is okay to do it and I sent a paper to the Dr. Laban Emperor with the fax number that given was [phone].  [employer] of the fax went through.

## 2023-06-13 NOTE — Patient Instructions (Signed)
 ______________________________________________________________________    Procedure instructions  Stop blood-thinners  Do not eat or drink fluids (other than water) for 6 hours before your procedure  No water for 2 hours before your procedure  Take your blood pressure medicine with a sip of water  Arrive 30 minutes before your appointment  If sedation is planned, bring suitable driver. Pennie Banter, Benedetto Goad, & public transportation are NOT APPROVED)  Carefully read the "Preparing for your procedure" detailed instructions  If you have questions call us at [phone]  Procedure appointments are for procedures only. NO medication refills or new problem evaluations.   ______________________________________________________________________      ______________________________________________________________________    Preparing for your procedure  Appointments: If you think you may not be able to keep your appointment, call 24-48 hours in advance to cancel. We need time to make it available to others.  Procedure visits are for procedures only. During your procedure appointment there will be: NO Prescription Refills*. NO medication changes or discussions*. NO discussion of disability issues*. NO unrelated pain problem evaluations*. NO evaluations to order other pain procedures*. *These will be addressed at a separate and distinct evaluation encounter on the provider's evaluation schedule and not during procedure days.  Instructions: Food intake: Avoid eating anything solid for at least 8 hours prior to your procedure. Clear liquid intake: You may take clear liquids such as water up to 2 hours prior to your procedure. (No carbonated drinks. No soda.) Transportation: Unless otherwise stated by your physician, bring a driver. (Driver cannot be a Market researcher, Pharmacist, community, or any other form of public transportation.) Morning Medicines: Except for blood thinners, take all of your other morning  medications with a sip of water. Make sure to take your heart and blood pressure medicines. If your blood pressure's lower number is above 100, the case will be rescheduled. Blood thinners: Make sure to stop your blood thinners as instructed.  If you take a blood thinner, but were not instructed to stop it, call our office [phone] and ask to talk to a nurse. Not stopping a blood thinner prior to certain procedures could lead to serious complications. Diabetics on insulin: Notify the staff so that you can be scheduled 1st case in the morning. If your diabetes requires high dose insulin, take only  of your normal insulin dose the morning of the procedure and notify the staff that you have done so. Preventing infections: Shower with an antibacterial soap the morning of your procedure.  Build-up your immune system: Take 1000 mg of Vitamin C with every meal (3 times a day) the day prior to your procedure. Antibiotics: Inform the nursing staff if you are taking any antibiotics or if you have any conditions that may require antibiotics prior to procedures. (Example: recent joint implants)   Pregnancy: If you are pregnant make sure to notify the nursing staff. Not doing so may result in injury to the fetus, including death.  Sickness: If you have a cold, fever, or any active infections, call and cancel or reschedule your procedure. Receiving steroids while having an infection may result in complications. Arrival: You must be in the facility at least 30 minutes prior to your scheduled procedure. Tardiness: Your scheduled time is also the cutoff time. If you do not arrive at least 15 minutes prior to your procedure, you will be rescheduled.  Children: Do not bring any children with you. Make arrangements to keep them home. Dress appropriately: There is always a possibility that your clothing may get  soiled. Avoid long dresses. Valuables: Do not bring any jewelry or valuables.  Reasons to call and  reschedule or cancel your procedure: (Following these recommendations will minimize the risk of a serious complication.) Surgeries: Avoid having procedures within 2 weeks of any surgery. (Avoid for 2 weeks before or after any surgery). Flu Shots: Avoid having procedures within 2 weeks of a flu shots or . (Avoid for 2 weeks before or after immunizations). Barium: Avoid having a procedure within 7-10 days after having had a radiological study involving the use of radiological contrast. (Myelograms, Barium swallow or enema study). Heart attacks: Avoid any elective procedures or surgeries for the initial 6 months after a "Myocardial Infarction" (Heart Attack). Blood thinners: It is imperative that you stop these medications before procedures. Let us know if you if you take any blood thinner.  Infection: Avoid procedures during or within two weeks of an infection (including chest colds or gastrointestinal problems). Symptoms associated with infections include: Localized redness, fever, chills, night sweats or profuse sweating, burning sensation when voiding, cough, congestion, stuffiness, runny nose, sore throat, diarrhea, nausea, vomiting, cold or Flu symptoms, recent or current infections. It is specially important if the infection is over the area that we intend to treat. Heart and lung problems: Symptoms that may suggest an active cardiopulmonary problem include: cough, chest pain, breathing difficulties or shortness of breath, dizziness, ankle swelling, uncontrolled high or unusually low blood pressure, and/or palpitations. If you are experiencing any of these symptoms, cancel your procedure and contact your primary care physician for an evaluation.  Remember:  Regular Business hours are:  Monday to Thursday 8:00 AM to 4:00 PM  Provider's Schedule: Delano Metz, MD:  Procedure days: Tuesday and Thursday 7:30 AM to 4:00 PM  Edward Jolly, MD:  Procedure days: Monday and Wednesday 7:30 AM to 4:00  PM Last  Updated: 05/03/2023 ______________________________________________________________________      ______________________________________________________________________    General Risks and Possible Complications  Patient Responsibilities: It is important that you read this as it is part of your informed consent. It is our duty to inform you of the risks and possible complications associated with treatments offered to you. It is your responsibility as a patient to read this and to ask questions about anything that is not clear or that you believe was not covered in this document.  Patient's Rights: You have the right to refuse treatment. You also have the right to change your mind, even after initially having agreed to have the treatment done. However, under this last option, if you wait until the last second to change your mind, you may be charged for the materials used up to that point.  Introduction: Medicine is not an Visual merchandiser. Everything in Medicine, including the lack of treatment(s), carries the potential for danger, harm, or loss (which is by definition: Risk). In Medicine, a complication is a secondary problem, condition, or disease that can aggravate an already existing one. All treatments carry the risk of possible complications. The fact that a side effects or complications occurs, does not imply that the treatment was conducted incorrectly. It must be clearly understood that these can happen even when everything is done following the highest safety standards.  No treatment: You can choose not to proceed with the proposed treatment alternative. The "PRO(s)" would include: avoiding the risk of complications associated with the therapy. The "CON(s)" would include: not getting any of the treatment benefits. These benefits fall under one of three categories: diagnostic; therapeutic; and/or  palliative. Diagnostic benefits include: getting information which can ultimately lead to  improvement of the disease or symptom(s). Therapeutic benefits are those associated with the successful treatment of the disease. Finally, palliative benefits are those related to the decrease of the primary symptoms, without necessarily curing the condition (example: decreasing the pain from a flare-up of a chronic condition, such as incurable terminal cancer).  General Risks and Complications: These are associated to most interventional treatments. They can occur alone, or in combination. They fall under one of the following six (6) categories: no benefit or worsening of symptoms; bleeding; infection; nerve damage; allergic reactions; and/or death. No benefits or worsening of symptoms: In Medicine there are no guarantees, only probabilities. No healthcare provider can ever guarantee that a medical treatment will work, they can only state the probability that it may. Furthermore, there is always the possibility that the condition may worsen, either directly, or indirectly, as a consequence of the treatment. Bleeding: This is more common if the patient is taking a blood thinner, either prescription or over the counter (example: Goody Powders, Fish oil, Aspirin, Garlic, etc.), or if suffering a condition associated with impaired coagulation (example: Hemophilia, cirrhosis of the liver, low platelet counts, etc.). However, even if you do not have one on these, it can still happen. If you have any of these conditions, or take one of these drugs, make sure to notify your treating physician. Infection: This is more common in patients with a compromised immune system, either due to disease (example: diabetes, cancer, human immunodeficiency virus [HIV], etc.), or due to medications or treatments (example: therapies used to treat cancer and rheumatological diseases). However, even if you do not have one on these, it can still happen. If you have any of these conditions, or take one of these drugs, make sure to notify  your treating physician. Nerve Damage: This is more common when the treatment is an invasive one, but it can also happen with the use of medications, such as those used in the treatment of cancer. The damage can occur to small secondary nerves, or to large primary ones, such as those in the spinal cord and brain. This damage may be temporary or permanent and it may lead to impairments that can range from temporary numbness to permanent paralysis and/or brain death. Allergic Reactions: Any time a substance or material comes in contact with our body, there is the possibility of an allergic reaction. These can range from a mild skin rash (contact dermatitis) to a severe systemic reaction (anaphylactic reaction), which can result in death. Death: In general, any medical intervention can result in death, most of the time due to an unforeseen complication. ______________________________________________________________________      ______________________________________________________________________    Blood Thinners  IMPORTANT NOTICE:  If you take any of these, make sure to notify the nursing staff.  Failure to do so may result in serious injury.  Recommended time intervals to stop and restart blood-thinners, before & after invasive procedures  Generic Name Brand Name Pre-procedure: Stop medication for this amount of time before your procedure: Post-procedure: Wait this amount of time after the procedure before restarting your medication:  Abciximab Reopro 15 days 2 hrs  Alteplase Activase 10 days 10 days  Anagrelide Agrylin    Apixaban Eliquis 3 days 6 hrs  Cilostazol Pletal 3 days 5 hrs  Clopidogrel Plavix 7-10 days 2 hrs  Dabigatran Pradaxa 5 days 6 hrs  Dalteparin Fragmin 24 hours 4 hrs  Dipyridamole Aggrenox 11days 2  hrs  Edoxaban [place] 3 days 2 hrs  Enoxaparin  Lovenox 24 hours 4 hrs  Eptifibatide Integrillin 8 hours 2 hrs  Fondaparinux  Arixtra 72 hours 12 hrs   Hydroxychloroquine Plaquenil 11 days   Prasugrel Effient 7-10 days 6 hrs  Reteplase Retavase 10 days 10 days  Rivaroxaban Xarelto 3 days 6 hrs  Ticagrelor Brilinta 5-7 days 6 hrs  Ticlopidine Ticlid 10-14 days 2 hrs  Tinzaparin Innohep 24 hours 4 hrs  Tirofiban Aggrastat 8 hours 2 hrs  Warfarin Coumadin 5 days 2 hrs   Other medications with blood-thinning effects  Product indications Generic (Brand) names Note  Cholesterol Lipitor Stop 4 days before procedure  Blood thinner (injectable) Heparin (LMW or LMWH Heparin) Stop 24 hours before procedure  Cancer Ibrutinib (Imbruvica) Stop 7 days before procedure  Malaria/Rheumatoid Hydroxychloroquine (Plaquenil) Stop 11 days before procedure  Thrombolytics  10 days before or after procedures   Over-the-counter (OTC) Products with blood-thinning effects  Product Common names Stop Time  Aspirin > 325 mg Goody Powders, Excedrin, etc. 11 days  Aspirin <= 81 mg  7 days  Fish oil  4 days  Garlic supplements  7 days  Ginkgo biloba  36 hours  Ginseng  24 hours  NSAIDs Ibuprofen, Naprosyn, etc. 3 days  Vitamin E  4 days   ______________________________________________________________________

## 2023-06-13 NOTE — Progress Notes (Signed)
Patient for IR Port Placement on Tues 06/14/23, I called and LVM for the patient on the phone and gave pre-procedure instructions. VM made pt aware to be here at 7:30a, NPO after MN prior to procedure as well as driver post procedure/recovery/discharge. Called 06/13/23

## 2023-06-13 NOTE — H&P (Shared)
Chief Complaint: Patient was seen in consultation today for No chief complaint on file.  at the request of Agrawal,Kavita  Referring Physician(s): Agrawal,Kavita  Supervising Physician: {Supervising Physician:[id number]}  Patient Status: [facility] - Out-pt  History of Present Illness: Yanet Frutiger is a 73 y.o. adult with history of CHF, HTN, osteoporosis, diverticulosis, diverticulitis, and non small cell lung cancer. Patient is currently followed by Dr. Michaelyn Barter with hematology. Patient initially had a right lung nodule found on a CT March 2022. Patient underwent SBRT December 2022. CT  9/14 reported an increase in size of the right upper lobe lung nodule. PET CT 04/06/23 showed a hypermetabolic left cervical, mediastinal and right hilar lymph nodes, which were concerning for metastatic disease. Patient had a EBUS 05/16/23 to biopsy these lymph nodes, but only was one biopsied during that time. 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 At her last follow up with Dr. Alena Bills 05/26/23, further chemotherapy was recommended. Patient was referred for a port placement  to assist with treatment.   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, 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   CHF (congestive heart failure) (HCC)    followed colon resection, "wouldn't let me get up"   Chronic anticoagulation    Degenerative disc disease, lumbar    Diverticulitis 2018   Diverticulosis    DVT of lower extremity, bilateral (HCC) 2018   Essential hypertension    History of being hospitalized    1 month ago for 3 days, vomiting, and kink in upper intestine   History of hiatal hernia    Mediastinal adenopathy 04/2023   MRSA nasal colonization    Non-small cell lung cancer (HCC) 2022   right   Obesity (BMI 30-39.9)    Osteoarthritis    Osteoporosis    PE (pulmonary thromboembolism) (HCC) 2018   Pneumonia    Small bowel obstruction  (HCC) 03/20/2023   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HCC)    Type 2 diabetes mellitus with obesity (HCC)    Vertigo     Past Surgical History:  Procedure Laterality Date   APPENDECTOMY N/A 02/24/2017   Procedure: Incidental  APPENDECTOMY;  Surgeon: Ricarda Frame, MD;  Location: [name];  Service: General;  Laterality: N/A;   CARPAL TUNNEL RELEASE Bilateral    CATARACT EXTRACTION W/PHACO Left 11/21/2018   Procedure: CATARACT EXTRACTION PHACO AND INTRAOCULAR LENS PLACEMENT (IOC) LEFT DIABETES;  Surgeon: Galen Manila, MD;  Location: [facility];  Service: Ophthalmology;  Laterality: Left;  latex sensitivity Diabetic - oral meds   CATARACT EXTRACTION W/PHACO Right 12/12/2018   Procedure: CATARACT EXTRACTION PHACO AND INTRAOCULAR LENS PLACEMENT (IOC) RIGHT DIABETES;  Surgeon: Galen Manila, MD;  Location: [facility];  Service: Ophthalmology;  Laterality: Right;  Diabetes-oral med Latex sensitiviy   COLON RESECTION SIGMOID N/A 11/02/2016   Procedure: COLON RESECTION SIGMOID;  Surgeon: Ricarda Frame, MD;  Location: [name];  Service: General;  Laterality: N/A;   COLON SURGERY  11/02/2016   COLONOSCOPY WITH PROPOFOL N/A 02/03/2017   Procedure: COLONOSCOPY WITH PROPOFOL;  Surgeon: Toney Reil, MD;  Location: [name];  Service: Gastroenterology;  Laterality: N/A;   COLOSTOMY N/A 11/02/2016   Procedure: COLOSTOMY;  Surgeon: Ricarda Frame, MD;  Location: [name];  Service: General;  Laterality: N/A;   COLOSTOMY REVERSAL N/A 02/24/2017   Procedure: COLOSTOMY REVERSAL, ostomy takedown, spleenic flexure mobilization, excision rectal stump/distal sigmoid, anastomosis with suture reinforcement;  Surgeon: Ricarda Frame, MD;  Location: [name];  Service: General;  Laterality: N/A;   ENDOBRONCHIAL ULTRASOUND N/A 05/16/2023   Procedure: ENDOBRONCHIAL ULTRASOUND;  Surgeon: [name], MD;  Location: [name];  Service: Cardiopulmonary;   Laterality: N/A;   FLEXIBLE BRONCHOSCOPY N/A 05/16/2023   Procedure: FLEXIBLE BRONCHOSCOPY;  Surgeon: [name], MD;  Location: [name];  Service: Cardiopulmonary;  Laterality: N/A;   ILEO LOOP DIVERSION N/A 02/24/2017   Procedure: ILEO LOOP COLOSTOMY;  Surgeon: Ricarda Frame, MD;  Location: [name];  Service: General;  Laterality: N/A;   ILEOSTOMY CLOSURE N/A 06/01/2017   Procedure: LOOP ILEOSTOMY TAKEDOWN;  Surgeon: Ricarda Frame, MD;  Location: [name];  Service: General;  Laterality: N/A;   IVC FILTER INSERTION Right 10/2016   IVC FILTER REMOVAL N/A 08/09/2017   Procedure: IVC FILTER REMOVAL;  Surgeon: Renford Dills, MD;  Location: [name];  Service: Cardiovascular;  Laterality: N/A;   KNEE SURGERY Right    torn menicus   LAPAROSCOPY N/A 02/24/2017   Procedure: LAPAROSCOPY DIAGNOSTIC;  Surgeon: Ricarda Frame, MD;  Location: [name];  Service: General;  Laterality: N/A;   LYSIS OF ADHESION N/A 02/24/2017   Procedure: LYSIS OF ADHESION;  Surgeon: Ricarda Frame, MD;  Location: [name];  Service: General;  Laterality: N/A;   PULMONARY VENOGRAPHY N/A 11/19/2016   Procedure: Pulmonary Venography; IVC filter placement; possible pulmonary thrombectomy;  Surgeon: Renford Dills, MD;  Location: [name];  Service: Cardiovascular;  Laterality: N/A;   SACROPLASTY N/A 11/08/2017   Procedure: SACROPLASTY S2;  Surgeon: Kennedy Bucker, MD;  Location: [name];  Service: Orthopedics;  Laterality: N/A;   SIGMOIDOSCOPY N/A 11/13/2016   Procedure: endoscopic  flexible SIGMOIDOSCOPY;  Surgeon: Lattie Haw, MD;  Location: [name];  Service: General;  Laterality: N/A;   SIGMOIDOSCOPY N/A 05/19/2017   Procedure: Arnell Sieving;  Surgeon: Ricarda Frame, MD;  Location: [name];  Service: General;  Laterality: N/A;   VIDEO BRONCHOSCOPY WITH ENDOBRONCHIAL NAVIGATION N/A 02/23/2021   Procedure: ROBOTIC VIDEO BRONCHOSCOPY WITH ENDOBRONCHIAL  NAVIGATION;  Surgeon: [name], MD;  Location: [name];  Service: Pulmonary;  Laterality: N/A;    Allergies: Adhesive [tape], Other, Latex, and Morphine and codeine  Medications: Prior to Admission medications   Medication Sig Start Date End Date Taking? Authorizing Provider  calcium carbonate (CALCIUM 600) 600 MG TABS tablet Take 1 tablet (600 mg total) by mouth 2 (two) times daily with a meal. 02/25/20 05/16/23  Delano Metz, MD  candesartan (ATACAND) 16 MG tablet Take 16 mg by mouth in the morning. 08/03/17   [provider]  cholecalciferol (VITAMIN D) 25 MCG (1000 UNIT) tablet Take 1,000 Units by mouth in the morning.    [provider]  Cyanocobalamin (VITAMIN B-12) 5000 MCG TBDP Take 5,000 mcg by mouth in the morning.    [provider]  cyclobenzaprine (FLEXERIL) 10 MG tablet Take 10 mg by mouth at bedtime. 07/05/16   [provider]  dexamethasone (DECADRON) 4 MG tablet Take 2 tablets daily for 2 days, start the day after chemotherapy. Take with food. 05/27/23   Michaelyn Barter, MD  Dulaglutide 1.5 MG/0.5ML SOPN Inject 1.5 mg into the skin every Saturday.    [provider]  gabapentin (NEURONTIN) 100 MG capsule Take 200 mg by mouth with breakfast, with lunch, and with evening meal.    [provider]  gabapentin (NEURONTIN) 300 MG capsule Take 300 mg by mouth at bedtime.  [provider]  glipiZIDE (GLUCOTROL) 10 MG tablet Take 10 mg by mouth daily before breakfast.    [provider]  HYDROcodone-acetaminophen (NORCO/VICODIN) 5-325 MG tablet Take 1 tablet by mouth every 6 (six) hours as needed for moderate pain.    [provider]  Magnesium Oxide -Mg Supplement 500 MG TABS Take 1 tablet by mouth at bedtime.    [provider]  meclizine (ANTIVERT) 25 MG tablet Take 25 mg by mouth 3 (three) times daily as needed for dizziness. 12/29/17   [provider]  ondansetron (ZOFRAN)  8 MG tablet Take 1 tablet (8 mg total) by mouth every 8 (eight) hours as needed for nausea or vomiting. Start on the third day after chemotherapy. 05/27/23   Michaelyn Barter, MD  oxybutynin (DITROPAN) 5 MG tablet Take 1 tablet by mouth 2 (two) times daily. 02/25/22   [provider]  prochlorperazine (COMPAZINE) 10 MG tablet Take 1 tablet (10 mg total) by mouth every 6 (six) hours as needed for nausea or vomiting. 05/27/23   Michaelyn Barter, MD  rOPINIRole (REQUIP) 0.5 MG tablet Take 0.5 mg by mouth at bedtime.    [provider]     Family History  Problem Relation Age of Onset   Diabetes Mother    Heart disease Mother    Cancer Mother    Breast cancer Mother        >50   Heart disease Father    Diabetes Sister    Heart disease Sister     Social History   Socioeconomic History   Marital status: Married    Spouse name: Broadus John   Number of children: 1   Years of education: Not on file   Highest education level: Not on file  Occupational History   Not on file  Tobacco Use   Smoking status: Never    Passive exposure: Current (husband smokes)   Smokeless tobacco: Never  Vaping Use   Vaping status: Never Used  Substance and Sexual Activity   Alcohol use: No    Alcohol/week: 0.0 standard drinks of alcohol   Drug use: No   Sexual activity: Never  Other Topics Concern   Not on file  Social History Narrative   Not on file   Social Drivers of Health   Financial Resource Strain: Low Risk  (05/19/2023)   Received from [facility]   Overall Financial Resource Strain (CARDIA)    Difficulty of Paying Living Expenses: Not hard at all  Food Insecurity: No Food Insecurity (05/19/2023)   Received from [facility]   Hunger Vital Sign    Worried About Running Out of Food in the Last Year: Never true    Ran Out of Food in the Last Year: Never true  Transportation Needs: No Transportation Needs (05/19/2023)   Received from [facility] - Transportation    In the past 12 months, has lack of transportation kept you from medical appointments or from getting medications?: No    Lack of Transportation (Non-Medical): No  Physical Activity: Not on file  Stress: Not on file  Social Connections: Not on file      Review of Systems: A 12 point ROS discussed and pertinent positives are indicated in the HPI above.  All other systems are negative.  Review of Systems  Vital Signs: There were no vitals taken for this visit.  Advance Care Plan: {Advance Care IRJJ:[id number]}    Physical  Exam  Imaging: CT HEAD W & WO CONTRAST ( ) Result Date: 06/13/2023 CLINICAL DATA:  Staging of lung carcinoma EXAM: CT HEAD WITHOUT AND WITH CONTRAST TECHNIQUE: Contiguous axial images were obtained from the base of the skull through the vertex without and with intravenous contrast. RADIATION DOSE REDUCTION: This exam was performed according to the departmental dose-optimization program which includes automated exposure control, adjustment of the mA and/or kV according to patient size and/or use of iterative reconstruction technique. CONTRAST:  75mL OMNIPAQUE IOHEXOL 300 MG/ML  SOLN COMPARISON:  11/19/2021 FINDINGS: Brain: There is no mass, hemorrhage or extra-axial collection. The size and configuration of the ventricles and extra-axial CSF spaces are normal. There is hypoattenuation of the white matter, most commonly indicating chronic small vessel disease. There is no abnormal contrast enhancement. Vascular: Atherosclerotic calcification of the internal carotid arteries at the skull base. No abnormal hyperdensity of the major intracranial arteries or dural venous sinuses. Skull: The visualized skull base, calvarium and extracranial soft tissues are normal. Sinuses/Orbits: No fluid levels or advanced mucosal thickening of the visualized paranasal sinuses. No mastoid or middle ear effusion. Normal orbits. Other: None.  IMPRESSION: 1. No acute intracranial abnormality or evidence of intracranial metastatic disease. 2. Chronic small vessel disease. Electronically Signed   By: Deatra Robinson M.D.   On: 06/13/2023 03:23   DG Chest Port 1 View Result Date: 05/16/2023 CLINICAL DATA:  Status post bronchoscopy. EXAM: PORTABLE CHEST 1 VIEW COMPARISON:  Chest radiograph dated 05/16/2023. FINDINGS: Shallow inspiration with bibasilar atelectasis. Right mid lung field opacity as seen previously. No pleural effusion or pneumothorax. Stable cardiac silhouette. No acute osseous pathology. IMPRESSION: 1. No pneumothorax. 2. Right mid lung field opacity as seen previously. Electronically Signed   By: Elgie Collard M.D.   On: 05/16/2023 16:23   DG Chest Port 1 View Result Date: 05/16/2023 CLINICAL DATA:  [id number] Breath sounds, abnormal [id number] EXAM: PORTABLE CHEST 1 VIEW COMPARISON:  05/10/2023 chest radiograph. FINDINGS: Stable cardiomediastinal silhouette with top-normal heart size. No pneumothorax. Small right pleural effusion is similar. No left pleural effusion. Chronic hazy bandlike right mid lung opacity. No superimposed acute consolidative airspace disease. IMPRESSION: 1. Small right pleural effusion, similar. 2. Chronic hazy bandlike right mid lung opacity, favor post treatment scarring. 3. No superimposed acute consolidative airspace disease. Electronically Signed   By: Delbert Phenix M.D.   On: 05/16/2023 13:51    Labs:  CBC: Recent Labs    03/21/23 0520 03/22/23 0231 05/09/23 1252 05/10/23 0426  WBC 9.9 8.4 6.6 6.0  HGB 13.1 12.9 12.4 12.4  HCT 39.5 38.7 38.6 38.1  PLT 263 226 273 314    COAGS: Recent Labs    03/21/23 1305 03/22/23 0231 05/09/23 1252 05/10/23 0426  INR 1.3* 1.3* 1.1 1.1  APTT 40*  --   --   --     BMP: Recent Labs    03/21/23 0520 03/22/23 0231 05/09/23 1252 05/10/23 0426  NA 137 135 135 135  K 3.8 3.9 3.9 3.5  CL 106 106 102 101  CO2 23 20* 23 25  GLUCOSE 192* 175* 144* 139*   BUN 17 17 9 15   CALCIUM 8.7* 8.3* 8.6* 8.3*  CREATININE 0.70 0.67 0.69 0.74  GFRNONAA >60 >60 >60 >60    LIVER FUNCTION TESTS: Recent Labs    03/20/23 1910 03/21/23 0520 05/09/23 1252 05/10/23 0426  BILITOT 1.2 0.8 0.5 0.5  AST 21 18 16 16   ALT 31 26 14 14   ALKPHOS 50 45 38  38  PROT 7.7 7.1 6.9 7.1  ALBUMIN 4.1 3.7 3.3* 3.2*    TUMOR MARKERS: No results for input(s): "AFPTM", "CEA", "CA199", "CHROMGRNA" in the last 8760 hours.  Assessment and Plan:  ***  Thank you for this interesting consult.  I greatly enjoyed meeting [employer] and look forward to participating in their care.  A copy of this report was sent to the requesting provider on this date.  Electronically Signed: Rosalita Levan, PA 06/13/2023, 1:56 PM   I spent a total of {New INPT:304952001} {New Out-Pt:304952002}  {Established Out-Pt:304952003} in face to face in clinical consultation, greater than 50% of which was counseling/coordinating care for ***

## 2023-06-13 NOTE — Progress Notes (Unsigned)
Safety precautions to be maintained throughout the outpatient stay will include: orient to surroundings, keep bed in low position, maintain call bell within reach at all times, provide assistance with transfer out of bed and ambulation.  

## 2023-06-14 ENCOUNTER — Encounter: Payer: Self-pay | Admitting: Radiology

## 2023-06-14 ENCOUNTER — Ambulatory Visit
Admission: RE | Admit: 2023-06-14 | Discharge: 2023-06-14 | Disposition: A | Discharge status: Home / Self Care | Payer: PPO | Source: Ambulatory Visit | Attending: Internal Medicine | Admitting: Internal Medicine

## 2023-06-14 ENCOUNTER — Other Ambulatory Visit: Payer: Self-pay | Admitting: Oncology

## 2023-06-14 DIAGNOSIS — C3411 Malignant neoplasm of upper lobe, right bronchus or lung: Secondary | ICD-10-CM

## 2023-06-14 DIAGNOSIS — C3490 Malignant neoplasm of unspecified part of unspecified bronchus or lung: Secondary | ICD-10-CM | POA: Diagnosis not present

## 2023-06-14 DIAGNOSIS — Z7984 Long term (current) use of oral hypoglycemic drugs: Secondary | ICD-10-CM | POA: Insufficient documentation

## 2023-06-14 DIAGNOSIS — I509 Heart failure, unspecified: Secondary | ICD-10-CM | POA: Insufficient documentation

## 2023-06-14 DIAGNOSIS — Z8249 Family history of ischemic heart disease and other diseases of the circulatory system: Secondary | ICD-10-CM | POA: Insufficient documentation

## 2023-06-14 DIAGNOSIS — Z7722 Contact with and (suspected) exposure to environmental tobacco smoke (acute) (chronic): Secondary | ICD-10-CM | POA: Insufficient documentation

## 2023-06-14 DIAGNOSIS — I110 Hypertensive heart disease with heart failure: Secondary | ICD-10-CM | POA: Insufficient documentation

## 2023-06-14 HISTORY — PX: IR IMAGING GUIDED PORT INSERTION: IMG5740

## 2023-06-14 LAB — PROTIME-INR
INR: 2.5 — ABNORMAL HIGH (ref 0.8–1.2)
Prothrombin Time: 27.1 s — ABNORMAL HIGH (ref 11.4–15.2)

## 2023-06-14 LAB — GLUCOSE, CAPILLARY: Glucose-Capillary: 149 mg/dL — ABNORMAL HIGH (ref 70–99)

## 2023-06-14 MED ORDER — SODIUM CHLORIDE 0.9 % IV SOLN: INTRAVENOUS | Status: DC

## 2023-06-14 MED ORDER — HEPARIN SOD (PORK) LOCK FLUSH 100 UNIT/ML IV SOLN
INTRAVENOUS | Status: AC
  Filled 2023-06-14: qty 5

## 2023-06-14 MED ORDER — MIDAZOLAM HCL 2 MG/2ML IJ SOLN
INTRAMUSCULAR | Status: AC | PRN
  Administered 2023-06-14: .5 mg via INTRAVENOUS
  Administered 2023-06-14: 1 mg via INTRAVENOUS

## 2023-06-14 MED ORDER — LIDOCAINE-EPINEPHRINE 1 %-1:100000 IJ SOLN
18.0000 mL | Freq: Once | INTRAMUSCULAR | Status: AC
  Administered 2023-06-14: 18 mL via INTRADERMAL

## 2023-06-14 MED ORDER — FENTANYL CITRATE (PF) 100 MCG/2ML IJ SOLN
INTRAMUSCULAR | Status: AC
  Filled 2023-06-14: qty 2

## 2023-06-14 MED ORDER — MIDAZOLAM HCL 2 MG/2ML IJ SOLN
INTRAMUSCULAR | Status: AC
  Filled 2023-06-14: qty 2

## 2023-06-14 MED ORDER — FENTANYL CITRATE (PF) 100 MCG/2ML IJ SOLN
INTRAMUSCULAR | Status: AC | PRN
  Administered 2023-06-14: 25 ug via INTRAVENOUS
  Administered 2023-06-14: 50 ug via INTRAVENOUS

## 2023-06-14 MED ORDER — LIDOCAINE-EPINEPHRINE 1 %-1:100000 IJ SOLN
INTRAMUSCULAR | Status: AC
Start: 2023-06-14 — End: ?
  Filled 2023-06-14: qty 1

## 2023-06-14 MED ORDER — HEPARIN SOD (PORK) LOCK FLUSH 100 UNIT/ML IV SOLN
500.0000 [IU] | Freq: Once | INTRAVENOUS | Status: AC
  Administered 2023-06-14: 500 [IU] via INTRAVENOUS

## 2023-06-14 NOTE — Procedures (Signed)
Interventional Radiology Procedure Note  Procedure: RT internal jugular POWER PORT    Complications: None  Estimated Blood Loss:  MIN  Findings: TIP SVCRA    M. TREVOR Levaeh Vice, MD    

## 2023-06-14 NOTE — Discharge Instructions (Signed)
Implanted Port Home Guide  An implanted port is a type of central line that is placed under the skin. Central lines are used to provide IV access when treatment or nutrition needs to be given through a person's veins. Implanted ports are used for long-term IV access. An implanted port may be placed because: You need IV medicine that would be irritating to the small veins in your hands or arms. You need long-term IV medicines, such as antibiotics. You need IV nutrition for a long period. You need frequent blood draws for lab tests. You need dialysis.   Implanted ports are usually placed in the chest area, but they can also be placed in the upper arm, the abdomen, or the leg. An implanted port has two main parts: Reservoir. The reservoir is round and will appear as a small, raised area under your skin. The reservoir is the part where a needle is inserted to give medicines or draw blood. Catheter. The catheter is a thin, flexible tube that extends from the reservoir. The catheter is placed into a large vein. Medicine that is inserted into the reservoir goes into the catheter and then into the vein.   How will I care for my incision  You may shower tomorrow Please remove dressing in 24hrs not other skin care is needed  How is my port accessed? Special steps must be taken to access the port: Before the port is accessed, a numbing cream can be placed on the skin. This helps numb the skin over the port site. Your health care provider uses a sterile technique to access the port. Your health care provider must put on a mask and sterile gloves. The skin over your port is cleaned carefully with an antiseptic and allowed to dry. The port is gently pinched between sterile gloves, and a needle is inserted into the port. Only "non-coring" port needles should be used to access the port. Once the port is accessed, a blood return should be checked. This helps ensure that the port is in the vein and is not  clogged. If your port needs to remain accessed for a constant infusion, a clear (transparent) bandage will be placed over the needle site. The bandage and needle will need to be changed every week, or as directed by your health care provider.   What is flushing? Flushing helps keep the port from getting clogged. Follow your health care provider's instructions on how and when to flush the port. Ports are usually flushed with saline solution or a medicine called heparin. The need for flushing will depend on how the port is used. If the port is used for intermittent medicines or blood draws, the port will need to be flushed: After medicines have been given. After blood has been drawn. As part of routine maintenance. If a constant infusion is running, the port may not need to be flushed.   How long will my port stay implanted? The port can stay in for as long as your health care provider thinks it is needed. When it is time for the port to come out, surgery will be done to remove it. The procedure is similar to the one performed when the port was put in. When should I seek immediate medical care? When you have an implanted port, you should seek immediate medical care if: You notice a bad smell coming from the incision site. You have swelling, redness, or drainage at the incision site. You have more swelling or pain at the   port site or the surrounding area. You have a fever that is not controlled with medicine.   This information is not intended to replace advice given to you by your health care provider. Make sure you discuss any questions you have with your health care provider. Document Released: 05/10/2005 Document Revised: 10/16/2015 Document Reviewed: 01/15/2013 Elsevier Interactive Patient Education  2017 [employer]   

## 2023-06-15 ENCOUNTER — Encounter: Payer: Self-pay | Admitting: Internal Medicine

## 2023-06-15 NOTE — Progress Notes (Signed)
Pharmacist Chemotherapy Monitoring - Initial Assessment    Anticipated start date: 06/24/23   The following has been reviewed per standard work regarding the patient's treatment regimen: The patient's diagnosis, treatment plan and drug doses, and organ/hematologic function Lab orders and baseline tests specific to treatment regimen  The treatment plan start date, drug sequencing, and pre-medications Prior authorization status  Patient's documented medication list, including drug-drug interaction screen and prescriptions for anti-emetics and supportive care specific to the treatment regimen The drug concentrations, fluid compatibility, administration routes, and timing of the medications to be used The patient's access for treatment and lifetime cumulative dose history, if applicable  The patient's medication allergies and previous infusion related reactions, if applicable   Changes made to treatment plan:  N/A  Follow up needed:  N/A   Sharen Hones, PharmD, BCPS Clinical Pharmacist   06/15/2023  12:21 PM

## 2023-06-16 ENCOUNTER — Inpatient Hospital Stay: Payer: PPO

## 2023-06-16 DIAGNOSIS — C3411 Malignant neoplasm of upper lobe, right bronchus or lung: Secondary | ICD-10-CM | POA: Diagnosis not present

## 2023-06-20 ENCOUNTER — Other Ambulatory Visit: Payer: Self-pay | Admitting: Oncology

## 2023-06-21 ENCOUNTER — Ambulatory Visit: Payer: PPO | Attending: Pain Medicine | Admitting: Pain Medicine

## 2023-06-21 ENCOUNTER — Encounter: Payer: Self-pay | Admitting: Pain Medicine

## 2023-06-21 ENCOUNTER — Ambulatory Visit
Admission: RE | Admit: 2023-06-21 | Discharge: 2023-06-21 | Disposition: A | Discharge status: Home / Self Care | Payer: PPO | Source: Ambulatory Visit | Attending: Pain Medicine | Admitting: Pain Medicine

## 2023-06-21 VITALS — BP 115/62 | HR 92 | Temp 97.7°F | Resp 16 | Ht 63.0 in | Wt 201.0 lb

## 2023-06-21 DIAGNOSIS — G8929 Other chronic pain: Secondary | ICD-10-CM | POA: Diagnosis not present

## 2023-06-21 DIAGNOSIS — M545 Low back pain, unspecified: Secondary | ICD-10-CM | POA: Insufficient documentation

## 2023-06-21 DIAGNOSIS — M5459 Other low back pain: Secondary | ICD-10-CM | POA: Insufficient documentation

## 2023-06-21 DIAGNOSIS — M47816 Spondylosis without myelopathy or radiculopathy, lumbar region: Secondary | ICD-10-CM | POA: Insufficient documentation

## 2023-06-21 DIAGNOSIS — Z7901 Long term (current) use of anticoagulants: Secondary | ICD-10-CM | POA: Insufficient documentation

## 2023-06-21 DIAGNOSIS — M47817 Spondylosis without myelopathy or radiculopathy, lumbosacral region: Secondary | ICD-10-CM | POA: Diagnosis not present

## 2023-06-21 DIAGNOSIS — M4310 Spondylolisthesis, site unspecified: Secondary | ICD-10-CM | POA: Diagnosis not present

## 2023-06-21 DIAGNOSIS — Z5189 Encounter for other specified aftercare: Secondary | ICD-10-CM | POA: Diagnosis not present

## 2023-06-21 DIAGNOSIS — E65 Localized adiposity: Secondary | ICD-10-CM | POA: Insufficient documentation

## 2023-06-21 DIAGNOSIS — M4306 Spondylolysis, lumbar region: Secondary | ICD-10-CM | POA: Diagnosis not present

## 2023-06-21 DIAGNOSIS — R937 Abnormal findings on diagnostic imaging of other parts of musculoskeletal system: Secondary | ICD-10-CM | POA: Diagnosis not present

## 2023-06-21 DIAGNOSIS — Z91040 Latex allergy status: Secondary | ICD-10-CM | POA: Diagnosis not present

## 2023-06-21 MED ORDER — TRIAMCINOLONE ACETONIDE 40 MG/ML IJ SUSP
INTRAMUSCULAR | Status: AC
  Filled 2023-06-21: qty 2

## 2023-06-21 MED ORDER — ROPIVACAINE HCL 2 MG/ML IJ SOLN
INTRAMUSCULAR | Status: AC
  Filled 2023-06-21: qty 20

## 2023-06-21 MED ORDER — MIDAZOLAM HCL 5 MG/5ML IJ SOLN
INTRAMUSCULAR | Status: AC
  Filled 2023-06-21: qty 5

## 2023-06-21 MED ORDER — LIDOCAINE HCL 2 % IJ SOLN
20.0000 mL | Freq: Once | INTRAMUSCULAR | Status: AC
  Administered 2023-06-21: 400 mg

## 2023-06-21 MED ORDER — LIDOCAINE HCL 2 % IJ SOLN
INTRAMUSCULAR | Status: AC
  Filled 2023-06-21: qty 20

## 2023-06-21 MED ORDER — FENTANYL CITRATE (PF) 100 MCG/2ML IJ SOLN
INTRAMUSCULAR | Status: AC
  Filled 2023-06-21: qty 2

## 2023-06-21 MED ORDER — FENTANYL CITRATE (PF) 100 MCG/2ML IJ SOLN
25.0000 ug | INTRAMUSCULAR | Status: DC | PRN
  Administered 2023-06-21: 50 ug via INTRAVENOUS

## 2023-06-21 MED ORDER — PENTAFLUOROPROP-TETRAFLUOROETH EX AERO
INHALATION_SPRAY | Freq: Once | CUTANEOUS | Status: AC
  Administered 2023-06-21: 30 via TOPICAL

## 2023-06-21 MED ORDER — TRIAMCINOLONE ACETONIDE 40 MG/ML IJ SUSP
80.0000 mg | Freq: Once | INTRAMUSCULAR | Status: AC
  Administered 2023-06-21: 80 mg

## 2023-06-21 MED ORDER — ROPIVACAINE HCL 2 MG/ML IJ SOLN
18.0000 mL | Freq: Once | INTRAMUSCULAR | Status: AC
  Administered 2023-06-21: 18 mL via PERINEURAL

## 2023-06-21 MED ORDER — MIDAZOLAM HCL 5 MG/5ML IJ SOLN
0.5000 mg | Freq: Once | INTRAMUSCULAR | Status: AC
  Administered 2023-06-21: 2 mg via INTRAVENOUS

## 2023-06-21 NOTE — Patient Instructions (Signed)

## 2023-06-21 NOTE — Progress Notes (Signed)
PROVIDER NOTE: Interpretation of information contained herein should be left to medically-trained personnel. Specific patient instructions are provided elsewhere under "Patient Instructions" section of medical record. This document was created in part using STT-dictation technology, any transcriptional errors that may result from this process are unintentional.  Patient: Kara [name] Type: Established DOB: [birth year] MRN: [id number] PCP: Barbette Reichmann, MD  Service: Procedure DOS: 06/21/2023 Setting: Ambulatory Location: Ambulatory outpatient facility Delivery: Face-to-face Provider: Oswaldo Done, MD Specialty: Interventional Pain Management Specialty designation: 09 Location: Outpatient facility Ref. Prov.: Barbette Reichmann, MD       Interventional Therapy   Type: Lumbar Facet, Medial Branch Block(s)   [id number]   Laterality: Bilateral  Level: L2, L3, L4, L5, and S1 Medial Branch Level(s). Injecting these levels blocks the L3-4, L4-5, and L5-S1 lumbar facet joints. Imaging: Fluoroscopic guidance Spinal ([id number]) Anesthesia: Local anesthesia (1-2% Lidocaine) Anxiolysis: IV Versed 2.0 mg Sedation: Moderate Sedation Fentanyl 0.5 mL (25 mcg) DOS: 06/21/2023 Performed by: Oswaldo Done, MD  Primary Purpose: Diagnostic/Therapeutic Indications: Low back pain severe enough to impact quality of life or function. 1. Chronic low back pain (1ry area of Pain) (Bilateral) w/o sciatica   2. 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 Lumbar facet joint pain   5. Lumbar facet arthropathy (L3-4, L4-5, and L5-S1) (Bilateral)   6. Lumbar facet syndrome (Bilateral)   7. 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 Spondylosis without myelopathy or radiculopathy, lumbosacral region   9. Abnormal MRI, lumbar spine (08/09/2019)    Chronic anticoagulation (COUMADIN)    Latex  precautions, history of latex allergy    Truncal obesity    NAS-11 Pain score:   Pre-procedure: 8 /10   Post-procedure: 0-No pain/10     Position / Prep / Materials:  Position: Prone  Prep solution: ChloraPrep (2% chlorhexidine gluconate and 70% isopropyl alcohol) Area Prepped: Posterolateral Lumbosacral Spine (Wide prep: From the lower border of the scapula down to the end of the tailbone and from flank to flank.)  Materials:  Tray: Block Needle(s):  Type: Spinal  Gauge (G): 22  Length: 5-in Qty: 4     H&P (Pre-op Assessment):  Kara [name] is a 73 y.o. (year old), adult patient, seen today for interventional treatment. She  has a past surgical history that includes Knee surgery (Right); Carpal tunnel release (Bilateral); Colon resection sigmoid (N/A, 11/02/2016); Colostomy (N/A, 11/02/2016); Sigmoidoscopy (N/A, 11/13/2016); PULMONARY VENOGRAPHY (N/A, 11/19/2016); Colonoscopy with propofol (N/A, 02/03/2017); Colon surgery (11/02/2016); IVC FILTER INSERTION (Right, 10/2016); Colostomy reversal (N/A, 02/24/2017); Appendectomy (N/A, 02/24/2017); Lysis of adhesion (N/A, 02/24/2017); laparoscopy (N/A, 02/24/2017); Ileo loop diversion (N/A, 02/24/2017); Sigmoidoscopy (N/A, 05/19/2017); Ileostomy closure (N/A, 06/01/2017); IVC FILTER REMOVAL (N/A, 08/09/2017); Sacroplasty (N/A, 11/08/2017); Cataract extraction w/PHACO (Left, 11/21/2018); Cataract extraction w/PHACO (Right, 12/12/2018); Video bronchoscopy with endobronchial navigation (N/A, 02/23/2021); Flexible bronchoscopy (N/A, 05/16/2023); Endobronchial ultrasound (N/A, 05/16/2023); and IR IMAGING GUIDED PORT INSERTION (06/14/2023). Kara [name] has a current medication list which includes the following prescription(s): candesartan, cholecalciferol, vitamin b-12, cyclobenzaprine, dexamethasone, dulaglutide, gabapentin, gabapentin, glipizide, hydrocodone-acetaminophen, magnesium oxide -mg supplement, meclizine, ondansetron, onetouch ultra test,  oxybutynin, prochlorperazine, ropinirole, warfarin, and calcium carbonate, and the following Facility-Administered Medications: fentanyl. Her primarily concern today is the Back Pain  Initial Vital Signs:  Pulse/HCG Rate: 92ECG Heart Rate: 91 (NSR) Temp: 97.7 F (36.5 C) Resp: 18 BP: 120/74 SpO2: 97 %  BMI: Estimated body mass index  is 35.61 kg/m as calculated from the following:   Height as of this encounter: 5\' 3"  (1.6 m).   Weight as of this encounter: 201 lb (91.2 kg).  Risk Assessment: Allergies: Reviewed. She is allergic to adhesive [tape], other, latex, and morphine and codeine.  Allergy Precautions: None required Coagulopathies: Reviewed. None identified.  Blood-thinner therapy: None at this time Active Infection(s): Reviewed. None identified. Kara [name] is afebrile  Site Confirmation: Kara [name] was asked to confirm the procedure and laterality before marking the site Procedure checklist: Completed Consent: Before the procedure and under the influence of no sedative(s), amnesic(s), or anxiolytics, the patient was informed of the treatment options, risks and possible complications. To fulfill our ethical and legal obligations, as recommended by the American Medical Association's Code of Ethics, I have informed the patient of my clinical impression; the nature and purpose of the treatment or procedure; the risks, benefits, and possible complications of the intervention; the alternatives, including doing nothing; the risk(s) and benefit(s) of the alternative treatment(s) or procedure(s); and the risk(s) and benefit(s) of doing nothing. The patient was provided information about the general risks and possible complications associated with the procedure. These may include, but are not limited to: failure to achieve desired goals, infection, bleeding, organ or nerve damage, allergic reactions, paralysis, and death. In addition, the patient was informed of those risks and  complications associated to Spine-related procedures, such as failure to decrease pain; infection (i.e.: Meningitis, epidural or intraspinal abscess); bleeding (i.e.: epidural hematoma, subarachnoid hemorrhage, or any other type of intraspinal or peri-dural bleeding); organ or nerve damage (i.e.: Any type of peripheral nerve, nerve root, or spinal cord injury) with subsequent damage to sensory, motor, and/or autonomic systems, resulting in permanent pain, numbness, and/or weakness of one or several areas of the body; allergic reactions; (i.e.: anaphylactic reaction); and/or death. Furthermore, the patient was informed of those risks and complications associated with the medications. These include, but are not limited to: allergic reactions (i.e.: anaphylactic or anaphylactoid reaction(s)); adrenal axis suppression; blood sugar elevation that in diabetics may result in ketoacidosis or comma; water retention that in patients with history of congestive heart failure may result in shortness of breath, pulmonary edema, and decompensation with resultant heart failure; weight gain; swelling or edema; medication-induced neural toxicity; particulate matter embolism and blood vessel occlusion with resultant organ, and/or nervous system infarction; and/or aseptic necrosis of one or more joints. Finally, the patient was informed that Medicine is not an exact science; therefore, there is also the possibility of unforeseen or unpredictable risks and/or possible complications that may result in a catastrophic outcome. The patient indicated having understood very clearly. We have given the patient no guarantees and we have made no promises. Enough time was given to the patient to ask questions, all of which were answered to the patient's satisfaction. Kara [name] has indicated that she wanted to continue with the procedure. Attestation: I, the ordering provider, attest that I have discussed with the patient the benefits, risks,  side-effects, alternatives, likelihood of achieving goals, and potential problems during recovery for the procedure that I have provided informed consent. Date  Time: 06/21/2023 10:29 AM   Pre-Procedure Preparation:  Monitoring: As per clinic protocol. Respiration, ETCO2, SpO2, BP, heart rate and rhythm monitor placed and checked for adequate function Safety Precautions: Patient was assessed for positional comfort and pressure points before starting the procedure. Time-out: I initiated and conducted the "Time-out" before starting the procedure, as per protocol. The patient was asked to  participate by confirming the accuracy of the "Time Out" information. Verification of the correct person, site, and procedure were performed and confirmed by me, the nursing staff, and the patient. "Time-out" conducted as per Joint Commission's Universal Protocol (UP.01.01.01). Time: 1135 Start Time: 1135 hrs.  Description of Procedure:          Laterality: (see above) Targeted Levels: (see above)  Safety Precautions: Aspiration looking for blood return was conducted prior to all injections. At no point did we inject any substances, as a needle was being advanced. Before injecting, the patient was told to immediately notify me if she was experiencing any new onset of "ringing in the ears, or metallic taste in the mouth". No attempts were made at seeking any paresthesias. Safe injection practices and needle disposal techniques used. Medications properly checked for expiration dates. SDV (single dose vial) medications used. After the completion of the procedure, all disposable equipment used was discarded in the proper designated medical waste containers. Local Anesthesia: Protocol guidelines were followed. The patient was positioned over the fluoroscopy table. The area was prepped in the usual manner. The time-out was completed. The target area was identified using fluoroscopy. A 12-in long, straight, sterile hemostat  was used with fluoroscopic guidance to locate the targets for each level blocked. Once located, the skin was marked with an approved surgical skin marker. Once all sites were marked, the skin (epidermis, dermis, and hypodermis), as well as deeper tissues (fat, connective tissue and muscle) were infiltrated with a small amount of a short-acting local anesthetic, loaded on a 10cc syringe with a 25G, 1.5-in  Needle. An appropriate amount of time was allowed for local anesthetics to take effect before proceeding to the next step. Local Anesthetic: Lidocaine 2.0% The unused portion of the local anesthetic was discarded in the proper designated containers. Technical description of process:  L2 Medial Branch Nerve Block (MBB): The target area for the L2 medial branch is at the junction of the postero-lateral aspect of the superior articular process and the superior, posterior, and medial edge of the transverse process of L3. Under fluoroscopic guidance, a Quincke needle was inserted until contact was made with os over the superior postero-lateral aspect of the pedicular shadow (target area). After negative aspiration for blood, 0.5 mL of the nerve block solution was injected without difficulty or complication. The needle was removed intact. L3 Medial Branch Nerve Block (MBB): The target area for the L3 medial branch is at the junction of the postero-lateral aspect of the superior articular process and the superior, posterior, and medial edge of the transverse process of L4. Under fluoroscopic guidance, a Quincke needle was inserted until contact was made with os over the superior postero-lateral aspect of the pedicular shadow (target area). After negative aspiration for blood, 0.5 mL of the nerve block solution was injected without difficulty or complication. The needle was removed intact. L4 Medial Branch Nerve Block (MBB): The target area for the L4 medial branch is at the junction of the postero-lateral aspect of  the superior articular process and the superior, posterior, and medial edge of the transverse process of L5. Under fluoroscopic guidance, a Quincke needle was inserted until contact was made with os over the superior postero-lateral aspect of the pedicular shadow (target area). After negative aspiration for blood, 0.5 mL of the nerve block solution was injected without difficulty or complication. The needle was removed intact. L5 Medial Branch Nerve Block (MBB): The target area for the L5 medial branch is at the  junction of the postero-lateral aspect of the superior articular process and the superior, posterior, and medial edge of the sacral ala. Under fluoroscopic guidance, a Quincke needle was inserted until contact was made with os over the superior postero-lateral aspect of the pedicular shadow (target area). After negative aspiration for blood, 0.5 mL of the nerve block solution was injected without difficulty or complication. The needle was removed intact. S1 Medial Branch Nerve Block (MBB): The target area for the S1 medial branch is at the posterior and inferior 6 o'clock position of the L5-S1 facet joint. Under fluoroscopic guidance, the Quincke needle inserted for the L5 MBB was redirected until contact was made with os over the inferior and postero aspect of the sacrum, at the 6 o' clock position under the L5-S1 facet joint (Target area). After negative aspiration for blood, 0.5 mL of the nerve block solution was injected without difficulty or complication. The needle was removed intact.  Once the entire procedure was completed, the treated area was cleaned, making sure to leave some of the prepping solution back to take advantage of its long term bactericidal properties.         Illustration of the posterior view of the lumbar spine and the posterior neural structures. Laminae of L2 through S1 are labeled. DPRL5, dorsal primary ramus of L5; DPRS1, dorsal primary ramus of S1; DPR3, dorsal  primary ramus of L3; FJ, facet (zygapophyseal) joint L3-L4; I, inferior articular process of L4; LB1, lateral branch of dorsal primary ramus of L1; IAB, inferior articular branches from L3 medial branch (supplies L4-L5 facet joint); IBP, intermediate branch plexus; MB3, medial branch of dorsal primary ramus of L3; NR3, third lumbar nerve root; S, superior articular process of L5; SAB, superior articular branches from L4 (supplies L4-5 facet joint also); TP3, transverse process of L3.   Facet Joint Innervation (* possible contribution)  L1-2 T12, L1 (L2*)  Medial Branch  L2-3 L1, L2 (L3*)         "          "  L3-4 L2, L3 (L4*)         "          "  L4-5 L3, L4 (L5*)         "          "  L5-S1 L4, L5, S1          "          "    Vitals:   06/21/23 1140 06/21/23 1145 06/21/23 1155 06/21/23 1205  BP: 120/88 119/78 117/68 110/60  Pulse:      Resp: 15 17 16 16   Temp:      TempSrc:      SpO2: 97% 96% 96% 97%  Weight:      Height:         End Time: 1145 hrs.  Imaging Guidance (Spinal):          Type of Imaging Technique: Fluoroscopy Guidance (Spinal) Indication(s): Fluoroscopy guidance for needle placement to enhance accuracy in procedures requiring precise needle localization for targeted delivery of medication in or near specific anatomical locations not easily accessible without such real-time imaging assistance. Exposure Time: Please see nurses notes. Contrast: None used. Fluoroscopic Guidance: I was personally present during the use of fluoroscopy. "Tunnel Vision Technique" used to obtain the best possible view of the target area. Parallax error corrected before commencing the procedure. "Direction-depth-direction" technique used to introduce the needle under continuous pulsed  fluoroscopy. Once target was reached, antero-posterior, oblique, and lateral fluoroscopic projection used confirm needle placement in all planes. Images permanently stored in EMR. Interpretation: No contrast  injected. I personally interpreted the imaging intraoperatively. Adequate needle placement confirmed in multiple planes. Permanent images saved into the patient's record.  Post-operative Assessment:  Post-procedure Vital Signs:  Pulse/HCG Rate: 9287 Temp: 97.7 F (36.5 C) Resp: 16 BP: 110/60 SpO2: 97 %  EBL: None  Complications: No immediate post-treatment complications observed by team, or reported by patient.  Note: The patient tolerated the entire procedure well. A repeat set of vitals were taken after the procedure and the patient was kept under observation following institutional policy, for this type of procedure. Post-procedural neurological assessment was performed, showing return to baseline, prior to discharge. The patient was provided with post-procedure discharge instructions, including a section on how to identify potential problems. Should any problems arise concerning this procedure, the patient was given instructions to immediately contact us, at any time, without hesitation. In any case, we plan to contact the patient by telephone for a follow-up status report regarding this interventional procedure.  Comments:  No additional relevant information.  Plan of Care (POC)  Orders:  Orders Placed This Encounter  Procedures   LUMBAR FACET(MEDIAL BRANCH NERVE BLOCK) MBNB    Scheduling Instructions:     Procedure: Lumbar facet block (AKA.: Lumbosacral medial branch nerve block)     Side: Bilateral     Level: L3-4, L4-5, L5-S1, and TBD Facets (L2, L3, L4, L5, S1, and TBD Medial Branch Nerves)     Sedation: Patient's choice.     Timeframe: Today    Where will this procedure be performed?:   [name]   [facility] C-ARM 1-60 MIN NO REPORT    Intraoperative interpretation by procedural physician at [facility].    Standing Status:   Standing    Number of Occurrences:   1    Reason for exam::   Assistance in needle guidance and placement for procedures  requiring needle placement in or near specific anatomical locations not easily accessible without such assistance.   Informed Consent Details: Physician/Practitioner Attestation; Transcribe to consent form and obtain patient signature    Nursing Order: Transcribe to consent form and obtain patient signature. Note: Always confirm laterality of pain with Kara [name], before procedure.    Physician/Practitioner attestation of informed consent for procedure/surgical case:   I, the physician/practitioner, attest that I have discussed with the patient the benefits, risks, side effects, alternatives, likelihood of achieving goals and potential problems during recovery for the procedure that I have provided informed consent.    Procedure:   Lumbar Facet Block  under fluoroscopic guidance    Physician/Practitioner performing the procedure:   Havard Radigan A. Laban Emperor MD    Indication/Reason:   Low Back Pain, with our without leg pain, due to Facet Joint Arthralgia (Joint Pain) Spondylosis (Arthritis of the Spine), without myelopathy or radiculopathy (Nerve Damage).   Care order/instruction: Please confirm that the patient has stopped the Coumadin (Warfarin) X 5 days prior to procedure or surgery.    Please confirm that the patient has stopped the Coumadin (Warfarin) X 5 days prior to procedure or surgery.    Standing Status:   Standing    Number of Occurrences:   1   Provide equipment / supplies at bedside    Procedure tray: "Block Tray" (Disposable  single use) Skin infiltration needle: Regular 1.5-in, 25-G, (x1) Block Needle type: Spinal Amount/quantity:  4 Size: Medium (5-inch) Gauge: 22G    Standing Status:   Standing    Number of Occurrences:   1    Specify:   Block Tray   Saline lock IV    Have LR [phone] mL available and administer at 125 mL/hr if patient becomes hypotensive.    Standing Status:   Standing    Number of Occurrences:   1   Bleeding precautions    Standing Status:   Standing     Number of Occurrences:   1   Latex precautions    Activate Latex-Free Protocol.    Standing Status:   Standing    Number of Occurrences:   1   Chronic Opioid Analgesic:   No chronic opioid analgesics therapy prescribed by our practice, 2ry to Medication Agreement Violation. Oxycodone IR 10 mg, 1 tab PO qd,  from PCP.  (Getting oxycodone from PCP in multiple occasions, according to PMP)(11/21/20; 12/26/20; 01/16/21; 02/13/21). Opioid analgesic pharmacotherapy D/C'ed on 03/03/2021. MME: 20 mg/day.   Medications ordered for procedure: Meds ordered this encounter  Medications   lidocaine (XYLOCAINE) 2 % (with pres) injection 400 mg   pentafluoroprop-tetrafluoroeth (GEBAUERS) aerosol   midazolam (VERSED) 5 MG/5ML injection 0.5-2 mg    Make sure Flumazenil is available in the pyxis when using this medication. If oversedation occurs, administer 0.2 mg IV over 15 sec. If after 45 sec no response, administer 0.2 mg again over 1 min; may repeat at 1 min intervals; not to exceed 4 doses (1 mg)   fentaNYL (SUBLIMAZE) injection 25-50 mcg    Make sure Narcan is available in the pyxis when using this medication. In the event of respiratory depression (RR< 8/min): Titrate NARCAN (naloxone) in increments of 0.1 to 0.2 mg IV at 2-3 minute intervals, until desired degree of reversal.   ropivacaine (PF) 2 mg/mL (0.2%) (NAROPIN) injection 18 mL   triamcinolone acetonide (KENALOG-40) injection 80 mg   Medications administered: We administered lidocaine, pentafluoroprop-tetrafluoroeth, midazolam, fentaNYL, ropivacaine (PF) 2 mg/mL (0.2%), and triamcinolone acetonide.  See the medical record for exact dosing, route, and time of administration.  Follow-up plan:   Return in about 2 weeks (around 07/05/2023) for (Face2F), (PPE).       Interventional Therapies  Risk  Complexity Considerations:   ANTICOAGULATION: Coumadin (Stop: 5 days  Re-start: 2hrs)  Hx DVT & PE ALLERGY: Latex Morbid obesity (class III)  (BMI>40)  metabolic syndrome  NIDDM  HTN Difficult Lumbar RFA due to morbid obesity    Planned  Pending: Palliative bilateral lumbar facet MBB [id number]    Under consideration:   Therapeutic bilateral lumbar facet RFA #2 (patient indicates preferring to have only the block without radiofrequency.)   Completed:   Therapeutic bilateral lumbar facet MBB [id number] (03/15/2023) (25/25/50/50)  Diagnostic/therapeutic right IA hip inj. x2 (01/28/2022) (100/100/85/85)  Diagnostic/therapeutic left IA hip inj. x1 (01/28/2022) (100/100/85/85)  Diagnostic/therapeutic right trochanteric bursa inj. x1 (09/15/2021) (85/85/85)  Diagnostic/therapeutic left trochanteric bursa inj. x1 (01/28/2022) (100/100/85/85)  Therapeutic left lumbar facet RFA x1 (07/21/2021) (100/100/100 x1 week/80) (Lasted 9 months)  Therapeutic right lumbar facet RFA x1 (07/07/2021) (100/100/80/80) (Lasted 9 months)  Palliative/Therapeutic left L4-5 LESI x1 (11/10/2017) (100/80/0/0-25)  Diagnostic/therapeutic right L4 TFESI x1 (11/19/2021) (90/80/0/0) (fell at [employer]) Palliative right SI joint block x9 (01/28/2022) (100/100/80/80)  Diagnostic left SI joint block x2 (01/28/2022) (100/100/80/80)  Palliative/Therapeutic (Midline) caudal ESI x3 (06/08/2018) (100/100/100 x 2-3 days/0)    Therapeutic  Palliative (PRN) options:   NO PRN procedures without F2F  exam by Dr. Laban Emperor due to multifactorial etiology of chronic pain. No more procedures until after 08/14/2022.    Pharmacotherapy  Nonopioids transferred 03/27/2020: Calcium       Recent Visits Date Type Provider Dept  06/13/23 Office Visit Delano Metz, MD [name]  04/07/23 Office Visit Delano Metz, MD [name]  Showing recent visits within past 90 days and meeting all other requirements Today's Visits Date Type Provider Dept  06/21/23 Procedure visit Delano Metz, MD [name]  Showing today's visits and meeting all  other requirements Future Appointments Date Type Provider Dept  07/07/23 Appointment Delano Metz, MD [name]  Showing future appointments within next 90 days and meeting all other requirements  Disposition: Discharge home  Discharge (Date  Time): 06/21/2023;   hrs.   Primary Care Physician: Barbette Reichmann, MD Location: [facility] Note by: Oswaldo Done, MD (TTS technology used. I apologize for any typographical errors that were not detected and corrected.) Date: 06/21/2023; Time: 12:11 PM  Disclaimer:  Medicine is not an Visual merchandiser. The only guarantee in medicine is that nothing is guaranteed. It is important to note that the decision to proceed with this intervention was based on the information collected from the patient. The Data and conclusions were drawn from the patient's questionnaire, the interview, and the physical examination. Because the information was provided in large part by the patient, it cannot be guaranteed that it has not been purposely or unconsciously manipulated. Every effort has been made to obtain as much relevant data as possible for this evaluation. It is important to note that the conclusions that lead to this procedure are derived in large part from the available data. Always take into account that the treatment will also be dependent on availability of resources and existing treatment guidelines, considered by other Pain Management Practitioners as being common knowledge and practice, at the time of the intervention. For Medico-Legal purposes, it is also important to point out that variation in procedural techniques and pharmacological choices are the acceptable norm. The indications, contraindications, technique, and results of the above procedure should only be interpreted and judged by a Board-Certified Interventional Pain Specialist with extensive familiarity and expertise in the same exact procedure and  technique.

## 2023-06-22 ENCOUNTER — Telehealth: Payer: Self-pay

## 2023-06-22 ENCOUNTER — Encounter: Payer: Self-pay | Admitting: Internal Medicine

## 2023-06-22 NOTE — Telephone Encounter (Signed)
Post procedure follow up. Patient states she is doing fine.

## 2023-06-23 ENCOUNTER — Encounter: Payer: Self-pay | Admitting: Internal Medicine

## 2023-06-23 ENCOUNTER — Ambulatory Visit
Admission: RE | Admit: 2023-06-23 | Discharge: 2023-06-23 | Disposition: A | Discharge status: Home / Self Care | Payer: PPO | Source: Ambulatory Visit | Attending: Radiation Oncology | Admitting: Radiation Oncology

## 2023-06-23 DIAGNOSIS — R911 Solitary pulmonary nodule: Secondary | ICD-10-CM | POA: Diagnosis not present

## 2023-06-23 DIAGNOSIS — C3411 Malignant neoplasm of upper lobe, right bronchus or lung: Secondary | ICD-10-CM | POA: Diagnosis not present

## 2023-06-24 ENCOUNTER — Other Ambulatory Visit: Payer: PPO

## 2023-06-24 ENCOUNTER — Ambulatory Visit: Payer: PPO | Admitting: Oncology

## 2023-06-24 ENCOUNTER — Ambulatory Visit: Payer: PPO

## 2023-06-24 ENCOUNTER — Telehealth: Payer: Self-pay | Admitting: *Deleted

## 2023-06-24 DIAGNOSIS — C3490 Malignant neoplasm of unspecified part of unspecified bronchus or lung: Secondary | ICD-10-CM | POA: Diagnosis not present

## 2023-06-24 NOTE — Telephone Encounter (Signed)
I took the call and she did not answer and I left message that it is a dry run and then the first radiation same day.

## 2023-06-27 ENCOUNTER — Ambulatory Visit: Payer: PPO

## 2023-06-27 ENCOUNTER — Ambulatory Visit
Admission: RE | Admit: 2023-06-27 | Discharge: 2023-06-27 | Disposition: A | Discharge status: Home / Self Care | Payer: PPO | Source: Ambulatory Visit | Attending: Radiation Oncology | Admitting: Radiation Oncology

## 2023-06-27 DIAGNOSIS — Z5111 Encounter for antineoplastic chemotherapy: Secondary | ICD-10-CM | POA: Diagnosis not present

## 2023-06-27 DIAGNOSIS — Z7722 Contact with and (suspected) exposure to environmental tobacco smoke (acute) (chronic): Secondary | ICD-10-CM | POA: Insufficient documentation

## 2023-06-27 DIAGNOSIS — Z510 Encounter for antineoplastic radiation therapy: Secondary | ICD-10-CM | POA: Insufficient documentation

## 2023-06-27 DIAGNOSIS — Z79899 Other long term (current) drug therapy: Secondary | ICD-10-CM | POA: Diagnosis not present

## 2023-06-27 DIAGNOSIS — C3411 Malignant neoplasm of upper lobe, right bronchus or lung: Secondary | ICD-10-CM | POA: Insufficient documentation

## 2023-06-27 DIAGNOSIS — C778 Secondary and unspecified malignant neoplasm of lymph nodes of multiple regions: Secondary | ICD-10-CM | POA: Diagnosis not present

## 2023-06-28 ENCOUNTER — Other Ambulatory Visit: Payer: Self-pay

## 2023-06-28 DIAGNOSIS — Z510 Encounter for antineoplastic radiation therapy: Secondary | ICD-10-CM | POA: Diagnosis not present

## 2023-06-28 DIAGNOSIS — C3411 Malignant neoplasm of upper lobe, right bronchus or lung: Secondary | ICD-10-CM | POA: Diagnosis not present

## 2023-06-28 LAB — RAD ONC ARIA SESSION SUMMARY
Course Elapsed Days: 0
Plan Fractions Treated to Date: 1
Plan Prescribed Dose Per Fraction: 2 Gy
Plan Total Fractions Prescribed: 35
Plan Total Prescribed Dose: 70 Gy
Reference Point Dosage Given to Date: 2 Gy
Reference Point Session Dosage Given: 2 Gy
Session Number: 1

## 2023-06-29 ENCOUNTER — Ambulatory Visit: Payer: PPO

## 2023-06-30 ENCOUNTER — Ambulatory Visit
Admission: RE | Admit: 2023-06-30 | Discharge: 2023-06-30 | Disposition: A | Discharge status: Home / Self Care | Payer: PPO | Source: Ambulatory Visit | Attending: Radiation Oncology | Admitting: Radiation Oncology

## 2023-06-30 ENCOUNTER — Other Ambulatory Visit: Payer: Self-pay

## 2023-06-30 DIAGNOSIS — Z7901 Long term (current) use of anticoagulants: Secondary | ICD-10-CM | POA: Diagnosis not present

## 2023-06-30 DIAGNOSIS — E1165 Type 2 diabetes mellitus with hyperglycemia: Secondary | ICD-10-CM | POA: Diagnosis not present

## 2023-06-30 DIAGNOSIS — Z86711 Personal history of pulmonary embolism: Secondary | ICD-10-CM | POA: Diagnosis not present

## 2023-06-30 DIAGNOSIS — C3411 Malignant neoplasm of upper lobe, right bronchus or lung: Secondary | ICD-10-CM | POA: Diagnosis not present

## 2023-06-30 DIAGNOSIS — I10 Essential (primary) hypertension: Secondary | ICD-10-CM | POA: Diagnosis not present

## 2023-06-30 DIAGNOSIS — Z510 Encounter for antineoplastic radiation therapy: Secondary | ICD-10-CM | POA: Diagnosis not present

## 2023-06-30 LAB — RAD ONC ARIA SESSION SUMMARY
Course Elapsed Days: 2
Plan Fractions Treated to Date: 2
Plan Prescribed Dose Per Fraction: 2 Gy
Plan Total Fractions Prescribed: 35
Plan Total Prescribed Dose: 70 Gy
Reference Point Dosage Given to Date: 4 Gy
Reference Point Session Dosage Given: 2 Gy
Session Number: 2

## 2023-07-01 ENCOUNTER — Encounter: Payer: Self-pay | Admitting: *Deleted

## 2023-07-01 ENCOUNTER — Encounter: Payer: Self-pay | Admitting: Oncology

## 2023-07-01 ENCOUNTER — Other Ambulatory Visit: Payer: Self-pay

## 2023-07-01 ENCOUNTER — Inpatient Hospital Stay: Payer: PPO

## 2023-07-01 ENCOUNTER — Inpatient Hospital Stay (HOSPITAL_BASED_OUTPATIENT_CLINIC_OR_DEPARTMENT_OTHER): Payer: PPO | Admitting: Oncology

## 2023-07-01 ENCOUNTER — Inpatient Hospital Stay: Payer: PPO | Admitting: Oncology

## 2023-07-01 ENCOUNTER — Other Ambulatory Visit: Payer: PPO

## 2023-07-01 ENCOUNTER — Ambulatory Visit: Payer: PPO

## 2023-07-01 ENCOUNTER — Ambulatory Visit
Admission: RE | Admit: 2023-07-01 | Discharge: 2023-07-01 | Disposition: A | Discharge status: Home / Self Care | Payer: PPO | Source: Ambulatory Visit | Attending: Radiation Oncology | Admitting: Radiation Oncology

## 2023-07-01 VITALS — BP 139/78 | HR 91 | Temp 97.0°F | Resp 18 | Wt 196.7 lb

## 2023-07-01 VITALS — BP 116/50 | HR 93 | Temp 97.3°F | Resp 18

## 2023-07-01 DIAGNOSIS — C778 Secondary and unspecified malignant neoplasm of lymph nodes of multiple regions: Secondary | ICD-10-CM | POA: Insufficient documentation

## 2023-07-01 DIAGNOSIS — Z79899 Other long term (current) drug therapy: Secondary | ICD-10-CM | POA: Insufficient documentation

## 2023-07-01 DIAGNOSIS — Z510 Encounter for antineoplastic radiation therapy: Secondary | ICD-10-CM | POA: Diagnosis not present

## 2023-07-01 DIAGNOSIS — Z5111 Encounter for antineoplastic chemotherapy: Secondary | ICD-10-CM | POA: Insufficient documentation

## 2023-07-01 DIAGNOSIS — C3411 Malignant neoplasm of upper lobe, right bronchus or lung: Secondary | ICD-10-CM | POA: Diagnosis not present

## 2023-07-01 DIAGNOSIS — C3490 Malignant neoplasm of unspecified part of unspecified bronchus or lung: Secondary | ICD-10-CM

## 2023-07-01 LAB — RAD ONC ARIA SESSION SUMMARY
Course Elapsed Days: 3
Plan Fractions Treated to Date: 3
Plan Prescribed Dose Per Fraction: 2 Gy
Plan Total Fractions Prescribed: 35
Plan Total Prescribed Dose: 70 Gy
Reference Point Dosage Given to Date: 6 Gy
Reference Point Session Dosage Given: 2 Gy
Session Number: 3

## 2023-07-01 LAB — CBC WITH DIFFERENTIAL (CANCER CENTER ONLY)
Abs Immature Granulocytes: 0.02 10*3/uL (ref 0.00–0.07)
Basophils Absolute: 0 10*3/uL (ref 0.0–0.1)
Basophils Relative: 0 %
Eosinophils Absolute: 0.1 10*3/uL (ref 0.0–0.5)
Eosinophils Relative: 1 %
HCT: 36.5 % (ref 36.0–46.0)
Hemoglobin: 11.6 g/dL — ABNORMAL LOW (ref 12.0–15.0)
Immature Granulocytes: 0 %
Lymphocytes Relative: 13 %
Lymphs Abs: 1 10*3/uL (ref 0.7–4.0)
MCH: 26.9 pg (ref 26.0–34.0)
MCHC: 31.8 g/dL (ref 30.0–36.0)
MCV: 84.7 fL (ref 80.0–100.0)
Monocytes Absolute: 0.7 10*3/uL (ref 0.1–1.0)
Monocytes Relative: 9 %
Neutro Abs: 5.6 10*3/uL (ref 1.7–7.7)
Neutrophils Relative %: 77 %
Platelet Count: 263 10*3/uL (ref 150–400)
RBC: 4.31 MIL/uL (ref 3.87–5.11)
RDW: 14.3 % (ref 11.5–15.5)
WBC Count: 7.3 10*3/uL (ref 4.0–10.5)
nRBC: 0 % (ref 0.0–0.2)

## 2023-07-01 LAB — CMP (CANCER CENTER ONLY)
ALT: 18 U/L (ref 0–44)
AST: 15 U/L (ref 15–41)
Albumin: 3.5 g/dL (ref 3.5–5.0)
Alkaline Phosphatase: 60 U/L (ref 38–126)
Anion gap: 7 (ref 5–15)
BUN: 23 mg/dL (ref 8–23)
CO2: 24 mmol/L (ref 22–32)
Calcium: 8.7 mg/dL — ABNORMAL LOW (ref 8.9–10.3)
Chloride: 104 mmol/L (ref 98–111)
Creatinine: 0.84 mg/dL (ref 0.44–1.00)
GFR, Estimated: >60 mL/min (ref >60)
Glucose, Bld: 238 mg/dL — ABNORMAL HIGH (ref 70–99)
Potassium: 3.8 mmol/L (ref 3.5–5.1)
Sodium: 135 mmol/L (ref 135–145)
Total Bilirubin: 0.5 mg/dL (ref 0.0–1.2)
Total Protein: 7.2 g/dL (ref 6.5–8.1)

## 2023-07-01 MED ORDER — SODIUM CHLORIDE 0.9 % IV SOLN
200.0000 mg | Freq: Once | INTRAVENOUS | Status: AC
  Administered 2023-07-01: 200 mg via INTRAVENOUS
  Filled 2023-07-01: qty 20

## 2023-07-01 MED ORDER — FAMOTIDINE IN NACL 20-0.9 MG/50ML-% IV SOLN
20.0000 mg | Freq: Once | INTRAVENOUS | Status: AC
  Administered 2023-07-01: 20 mg via INTRAVENOUS
  Filled 2023-07-01: qty 50

## 2023-07-01 MED ORDER — SODIUM CHLORIDE 0.9% FLUSH
3.0000 mL | INTRAVENOUS | Status: DC | PRN
  Administered 2023-07-01: 3 mL
  Filled 2023-07-01: qty 3

## 2023-07-01 MED ORDER — HEPARIN SOD (PORK) LOCK FLUSH 100 UNIT/ML IV SOLN
500.0000 [IU] | Freq: Once | INTRAVENOUS | Status: AC | PRN
  Administered 2023-07-01: 500 [IU]
  Filled 2023-07-01: qty 5

## 2023-07-01 MED ORDER — PALONOSETRON HCL INJECTION 0.25 MG/5ML
0.2500 mg | Freq: Once | INTRAVENOUS | Status: AC
  Administered 2023-07-01: 0.25 mg via INTRAVENOUS
  Filled 2023-07-01: qty 5

## 2023-07-01 MED ORDER — DEXAMETHASONE SODIUM PHOSPHATE 10 MG/ML IJ SOLN
10.0000 mg | Freq: Once | INTRAMUSCULAR | Status: AC
  Administered 2023-07-01: 10 mg via INTRAVENOUS
  Filled 2023-07-01: qty 1

## 2023-07-01 MED ORDER — SODIUM CHLORIDE 0.9 % IV SOLN
45.0000 mg/m2 | Freq: Once | INTRAVENOUS | Status: AC
  Administered 2023-07-01: 90 mg via INTRAVENOUS
  Filled 2023-07-01: qty 15

## 2023-07-01 MED ORDER — SODIUM CHLORIDE 0.9 % IV SOLN
INTRAVENOUS | Status: DC
  Filled 2023-07-01: qty 250

## 2023-07-01 MED ORDER — DIPHENHYDRAMINE HCL 50 MG/ML IJ SOLN
50.0000 mg | Freq: Once | INTRAMUSCULAR | Status: AC
  Administered 2023-07-01: 50 mg via INTRAVENOUS
  Filled 2023-07-01: qty 1

## 2023-07-01 NOTE — Progress Notes (Signed)
 Met with patient during follow up visit with Dr. Randy Buttery. All questions answered during visit. Reviewed upcoming appts. Instructed to call with any questions or needs. Pt verbalized understanding.

## 2023-07-01 NOTE — Patient Instructions (Signed)
 [facility] - A DEPT OF [facility]  Discharge Instructions: Thank you for choosing [facility] to provide your oncology and hematology care.  If you have a lab appointment with the Cancer Center, please go directly to the Cancer Center and check in at the registration area.  Wear comfortable clothing and clothing appropriate for easy access to any Portacath or PICC line.   We strive to give you quality time with your provider. You may need to reschedule your appointment if you arrive late (15 or more minutes).  Arriving late affects you and other patients whose appointments are after yours.  Also, if you miss three or more appointments without notifying the office, you may be dismissed from the clinic at the provider's discretion.      For prescription refill requests, have your pharmacy contact our office and allow 72 hours for refills to be completed.    Today you received the following chemotherapy and/or immunotherapy agents Paclitaxel and Carboplatin      To help prevent nausea and vomiting after your treatment, we encourage you to take your nausea medication as directed.  BELOW ARE SYMPTOMS THAT SHOULD BE REPORTED IMMEDIATELY: *FEVER GREATER THAN 100.4 F (38 C) OR HIGHER *CHILLS OR SWEATING *NAUSEA AND VOMITING THAT IS NOT CONTROLLED WITH YOUR NAUSEA MEDICATION *UNUSUAL SHORTNESS OF BREATH *UNUSUAL BRUISING OR BLEEDING *URINARY PROBLEMS (pain or burning when urinating, or frequent urination) *BOWEL PROBLEMS (unusual diarrhea, constipation, pain near the anus) TENDERNESS IN MOUTH AND THROAT WITH OR WITHOUT PRESENCE OF ULCERS (sore throat, sores in mouth, or a toothache) UNUSUAL RASH, SWELLING OR PAIN  UNUSUAL VAGINAL DISCHARGE OR ITCHING   Items with * indicate a potential emergency and should be followed up as soon as possible or go to the Emergency Department if any problems should occur.  Please show the CHEMOTHERAPY ALERT CARD or  IMMUNOTHERAPY ALERT CARD at check-in to the Emergency Department and triage nurse.  Should you have questions after your visit or need to cancel or reschedule your appointment, please contact [facility] - A DEPT OF [facility]  [phone] and follow the prompts.  Office hours are 8:00 a.m. to 4:30 p.m. Monday - Friday. Please note that voicemails left after 4:00 p.m. may not be returned until the following business day.  We are closed weekends and major holidays. You have access to a nurse at all times for urgent questions. Please call the main number to the clinic [phone] and follow the prompts.  For any non-urgent questions, you may also contact your provider using MyChart. We now offer e-Visits for anyone 39 and older to request care online for non-urgent symptoms. For details visit [email or web address].   Also download the MyChart app! Go to the app store, search "MyChart", open the app, select [facility], and log in with your MyChart username and password.

## 2023-07-01 NOTE — Progress Notes (Signed)
 Carboplatin  dose cap creatinine at 1 due to age per Dr Randy Buttery  Carboplatin  AUC 2 = 200 mg  T.O. Dr Abigail Abler, PharmD

## 2023-07-04 ENCOUNTER — Ambulatory Visit
Admission: RE | Admit: 2023-07-04 | Discharge: 2023-07-04 | Disposition: A | Discharge status: Home / Self Care | Payer: PPO | Source: Ambulatory Visit | Attending: Radiation Oncology | Admitting: Radiation Oncology

## 2023-07-04 ENCOUNTER — Telehealth: Payer: Self-pay

## 2023-07-04 ENCOUNTER — Telehealth: Payer: Self-pay | Admitting: *Deleted

## 2023-07-04 ENCOUNTER — Encounter: Payer: Self-pay | Admitting: Internal Medicine

## 2023-07-04 ENCOUNTER — Other Ambulatory Visit: Payer: Self-pay

## 2023-07-04 DIAGNOSIS — Z510 Encounter for antineoplastic radiation therapy: Secondary | ICD-10-CM | POA: Diagnosis not present

## 2023-07-04 DIAGNOSIS — C3411 Malignant neoplasm of upper lobe, right bronchus or lung: Secondary | ICD-10-CM | POA: Diagnosis not present

## 2023-07-04 LAB — RAD ONC ARIA SESSION SUMMARY
Course Elapsed Days: 6
Plan Fractions Treated to Date: 4
Plan Prescribed Dose Per Fraction: 2 Gy
Plan Total Fractions Prescribed: 35
Plan Total Prescribed Dose: 70 Gy
Reference Point Dosage Given to Date: 8 Gy
Reference Point Session Dosage Given: 2 Gy
Session Number: 4

## 2023-07-04 NOTE — Progress Notes (Signed)
 Hematology/Oncology Consult note [facility]  [phone] [phone]  Patient Care Team: Sadie Manna, MD as PCP - General (Internal Medicine) Verdene Gills, RN as Oncology Nurse Navigator Melanee Annah BROCKS, MD as Consulting Physician (Oncology)   Name of the patient: Kara [name]  [id number]  June 09, 1950   Date of visit: 07/04/23  Diagnosis-right upper lobe lung cancer s/p SBRT now with mediastinal recurrence  Chief complaint/ Reason for visit-on treatment assessment prior to cycle 1 of weekly CarboTaxol chemotherapy  Heme/Onc history: Patient is a 73 year old female who underwent SBRT for presumed right upper lobe lung cancer in December 2022.  She was in remission until November 2024 when she was noted to have hypermetabolic in the right hilum as well as mediastinal region.  15 x 12 mm nodular opacity in the dome of the right diaphragm.  10 mm left cervical lymph node.  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  Right pleural thoracentesis negative for malignancy.  Plan is for concurrent chemoradiation with weekly CarboTaxol.  Interval history-overall she feels well presently and other than chronic fatigue denies other complaints at this time  ECOG PS- 2 Pain scale- 0   Review of systems- Review of Systems  Constitutional:  Positive for malaise/fatigue. Negative for chills, fever and weight loss.  HENT:  Negative for congestion, ear discharge and nosebleeds.   Eyes:  Negative for blurred vision.  Respiratory:  Negative for cough, hemoptysis, sputum production, shortness of breath and wheezing.   Cardiovascular:  Negative for chest pain, palpitations, orthopnea and claudication.  Gastrointestinal:  Negative for abdominal pain, blood in stool, constipation, diarrhea, heartburn, melena, nausea and vomiting.  Genitourinary:  Negative for dysuria, flank pain, frequency, hematuria and urgency.   Musculoskeletal:  Negative for back pain, joint pain and myalgias.  Skin:  Negative for rash.  Neurological:  Negative for dizziness, tingling, focal weakness, seizures, weakness and headaches.  Endo/Heme/Allergies:  Does not bruise/bleed easily.  Psychiatric/Behavioral:  Negative for depression and suicidal ideas. The patient does not have insomnia.       Allergies  Allergen Reactions   Adhesive [Tape] Other (See Comments)    Pt reports, when removing tape from skin most tapes pull her skin off. Ok to use paper tape   Other Hives    Chlorhexadine wipes/CHG wipes,    Latex Rash    Exam gloves/ does not react around elastic and lips don't swell when blowing balloons   Morphine  And Codeine  Nausea And Vomiting     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, 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   CHF (congestive heart failure) (HCC)    followed colon resection, wouldn't let me get up   Chronic anticoagulation    Degenerative disc disease, lumbar    Diverticulitis 2018   Diverticulosis    DVT of lower extremity, bilateral (HCC) 2018   Essential hypertension    History of being hospitalized    1 month ago for 3 days, vomiting, and kink in upper intestine   History of hiatal hernia    Mediastinal adenopathy 04/2023   MRSA nasal colonization    Non-small cell lung cancer (HCC) 2022   right   Obesity (BMI 30-39.9)    Osteoarthritis    Osteoporosis    PE (pulmonary thromboembolism) (HCC) 2018   Pneumonia    Small bowel obstruction (HCC) 03/20/2023  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HCC)    Type 2 diabetes mellitus with obesity (HCC)    Vertigo      Past Surgical History:  Procedure Laterality Date   APPENDECTOMY N/A 02/24/2017   Procedure: Incidental  APPENDECTOMY;  Surgeon: Shelva Dunnings, MD;  Location: [name];  Service: General;  Laterality: N/A;   CARPAL TUNNEL RELEASE Bilateral    CATARACT EXTRACTION  W/PHACO Left 11/21/2018   Procedure: CATARACT EXTRACTION PHACO AND INTRAOCULAR LENS PLACEMENT (IOC) LEFT DIABETES;  Surgeon: Jaye Fallow, MD;  Location: [facility];  Service: Ophthalmology;  Laterality: Left;  latex sensitivity Diabetic - oral meds   CATARACT EXTRACTION W/PHACO Right 12/12/2018   Procedure: CATARACT EXTRACTION PHACO AND INTRAOCULAR LENS PLACEMENT (IOC) RIGHT DIABETES;  Surgeon: Jaye Fallow, MD;  Location: [facility];  Service: Ophthalmology;  Laterality: Right;  Diabetes-oral med Latex sensitiviy   COLON RESECTION SIGMOID N/A 11/02/2016   Procedure: COLON RESECTION SIGMOID;  Surgeon: Shelva Dunnings, MD;  Location: [name];  Service: General;  Laterality: N/A;   COLON SURGERY  11/02/2016   COLONOSCOPY WITH PROPOFOL  N/A 02/03/2017   Procedure: COLONOSCOPY WITH PROPOFOL ;  Surgeon: Unk Corinn Skiff, MD;  Location: [name];  Service: Gastroenterology;  Laterality: N/A;   COLOSTOMY N/A 11/02/2016   Procedure: COLOSTOMY;  Surgeon: Shelva Dunnings, MD;  Location: [name];  Service: General;  Laterality: N/A;   COLOSTOMY REVERSAL N/A 02/24/2017   Procedure: COLOSTOMY REVERSAL, ostomy takedown, spleenic flexure mobilization, excision rectal stump/distal sigmoid, anastomosis with suture reinforcement;  Surgeon: Shelva Dunnings, MD;  Location: [name];  Service: General;  Laterality: N/A;   ENDOBRONCHIAL ULTRASOUND N/A 05/16/2023   Procedure: ENDOBRONCHIAL ULTRASOUND;  Surgeon: Tamea Dedra CROME, MD;  Location: [name];  Service: Cardiopulmonary;  Laterality: N/A;   FLEXIBLE BRONCHOSCOPY N/A 05/16/2023   Procedure: FLEXIBLE BRONCHOSCOPY;  Surgeon: Tamea Dedra CROME, MD;  Location: [name];  Service: Cardiopulmonary;  Laterality: N/A;   ILEO LOOP DIVERSION N/A 02/24/2017   Procedure: ILEO LOOP COLOSTOMY;  Surgeon: Shelva Dunnings, MD;  Location: [name];  Service: General;  Laterality: N/A;   ILEOSTOMY CLOSURE N/A 06/01/2017   Procedure:  LOOP ILEOSTOMY TAKEDOWN;  Surgeon: Shelva Dunnings, MD;  Location: [name];  Service: General;  Laterality: N/A;   IR IMAGING GUIDED PORT INSERTION  06/14/2023   IVC FILTER INSERTION Right 10/2016   IVC FILTER REMOVAL N/A 08/09/2017   Procedure: IVC FILTER REMOVAL;  Surgeon: Jama Cordella MATSU, MD;  Location: [name];  Service: Cardiovascular;  Laterality: N/A;   KNEE SURGERY Right    torn menicus   LAPAROSCOPY N/A 02/24/2017   Procedure: LAPAROSCOPY DIAGNOSTIC;  Surgeon: Shelva Dunnings, MD;  Location: [name];  Service: General;  Laterality: N/A;   LYSIS OF ADHESION N/A 02/24/2017   Procedure: LYSIS OF ADHESION;  Surgeon: Shelva Dunnings, MD;  Location: [name];  Service: General;  Laterality: N/A;   PULMONARY VENOGRAPHY N/A 11/19/2016   Procedure: Pulmonary Venography; IVC filter placement; possible pulmonary thrombectomy;  Surgeon: Jama Cordella MATSU, MD;  Location: [name];  Service: Cardiovascular;  Laterality: N/A;   SACROPLASTY N/A 11/08/2017   Procedure: SACROPLASTY S2;  Surgeon: Kathlynn Sharper, MD;  Location: [name];  Service: Orthopedics;  Laterality: N/A;   SIGMOIDOSCOPY N/A 11/13/2016   Procedure: endoscopic  flexible SIGMOIDOSCOPY;  Surgeon: Wonda Charlie BRAVO, MD;  Location: [name];  Service: General;  Laterality: N/A;   SIGMOIDOSCOPY N/A 05/19/2017   Procedure: ENID MORIN;  Surgeon: Shelva Dunnings, MD;  Location:  [name];  Service: General;  Laterality: N/A;   VIDEO BRONCHOSCOPY WITH ENDOBRONCHIAL NAVIGATION N/A 02/23/2021   Procedure: ROBOTIC VIDEO BRONCHOSCOPY WITH ENDOBRONCHIAL NAVIGATION;  Surgeon: Tamea Dedra CROME, MD;  Location: [name];  Service: Pulmonary;  Laterality: N/A;    Social History   Socioeconomic History   Marital status: Married    Spouse name: Butler   Number of children: 1   Years of education: Not on file   Highest education level: Not on file  Occupational History   Not on file  Tobacco Use    Smoking status: Never    Passive exposure: Current (husband smokes)   Smokeless tobacco: Never  Vaping Use   Vaping status: Never Used  Substance and Sexual Activity   Alcohol  use: No    Alcohol /week: 0.0 standard drinks of alcohol    Drug use: No   Sexual activity: Never  Other Topics Concern   Not on file  Social History Narrative   Not on file   Social Drivers of Health   Financial Resource Strain: Low Risk  (05/19/2023)   Received from [facility]   Overall Financial Resource Strain (CARDIA)    Difficulty of Paying Living Expenses: Not hard at all  Food Insecurity: No Food Insecurity (05/19/2023)   Received from [facility]   Hunger Vital Sign    Worried About Running Out of Food in the Last Year: Never true    Ran Out of Food in the Last Year: Never true  Transportation Needs: No Transportation Needs (05/19/2023)   Received from [facility] - Transportation    In the past 12 months, has lack of transportation kept you from medical appointments or from getting medications?: No    Lack of Transportation (Non-Medical): No  Physical Activity: Not on file  Stress: Not on file  Social Connections: Not on file  Intimate Partner Violence: Not At Risk (05/09/2023)   Humiliation, Afraid, Rape, and Kick questionnaire    Fear of Current or Ex-Partner: No    Emotionally Abused: No    Physically Abused: No    Sexually Abused: No    Family History  Problem Relation Age of Onset   Diabetes Mother    Heart disease Mother    Cancer Mother    Breast cancer Mother        >50   Heart disease Father    Diabetes Sister    Heart disease Sister      Current Outpatient Medications:    candesartan (ATACAND) 16 MG tablet, Take 16 mg by mouth in the morning., Disp: , Rfl:    cholecalciferol  (VITAMIN D ) 25 MCG (1000 UNIT) tablet, Take 1,000 Units by mouth in the morning., Disp: , Rfl:    Cyanocobalamin  (VITAMIN B-12) 5000  MCG TBDP, Take 5,000 mcg by mouth in the morning., Disp: , Rfl:    cyclobenzaprine  (FLEXERIL ) 10 MG tablet, Take 10 mg by mouth at bedtime., Disp: , Rfl:    dexamethasone  (DECADRON ) 4 MG tablet, Take 2 tablets daily for 2 days, start the day after chemotherapy. Take with food., Disp: 30 tablet, Rfl: 1   Dulaglutide 1.5 MG/0.5ML SOPN, Inject 1.5 mg into the skin every Saturday., Disp: , Rfl:    gabapentin  (NEURONTIN ) 100 MG capsule, Take 200 mg by mouth with breakfast, with lunch, and with evening meal., Disp: , Rfl:    gabapentin  (NEURONTIN ) 300 MG capsule, Take 300 mg by mouth at bedtime., Disp: ,  Rfl:    glipiZIDE  (GLUCOTROL ) 10 MG tablet, Take 10 mg by mouth daily before breakfast., Disp: , Rfl:    HYDROcodone -acetaminophen  (NORCO/VICODIN) 5-325 MG tablet, Take 1 tablet by mouth every 6 (six) hours as needed for moderate pain., Disp: , Rfl:    Magnesium  Oxide -Mg Supplement 500 MG TABS, Take 1 tablet by mouth at bedtime., Disp: , Rfl:    meclizine (ANTIVERT) 25 MG tablet, Take 25 mg by mouth 3 (three) times daily as needed for dizziness., Disp: , Rfl:    ondansetron  (ZOFRAN ) 8 MG tablet, Take 1 tablet (8 mg total) by mouth every 8 (eight) hours as needed for nausea or vomiting. Start on the third day after chemotherapy., Disp: 30 tablet, Rfl: 1   ONETOUCH ULTRA TEST test strip, , Disp: , Rfl:    oxybutynin  (DITROPAN ) 5 MG tablet, Take 1 tablet by mouth 2 (two) times daily., Disp: , Rfl:    prochlorperazine  (COMPAZINE ) 10 MG tablet, Take 1 tablet (10 mg total) by mouth every 6 (six) hours as needed for nausea or vomiting., Disp: 30 tablet, Rfl: 1   rOPINIRole  (REQUIP ) 0.5 MG tablet, Take 0.5 mg by mouth at bedtime., Disp: , Rfl:    warfarin (COUMADIN ) 6 MG tablet, Take 6 mg by mouth at bedtime., Disp: , Rfl:    calcium  carbonate (CALCIUM  600) 600 MG TABS tablet, Take 1 tablet (600 mg total) by mouth 2 (two) times daily with a meal., Disp: 60 tablet, Rfl: 5  Physical exam:  Vitals:   07/01/23  0940  BP: 139/78  Pulse: 91  Resp: 18  Temp: (!) 97 F (36.1 C)  TempSrc: Tympanic  SpO2: 98%  Weight: 196 lb 11.2 oz (89.2 kg)   Physical Exam Cardiovascular:     Rate and Rhythm: Normal rate and regular rhythm.     Heart sounds: Normal heart sounds.  Pulmonary:     Effort: Pulmonary effort is normal.     Breath sounds: Normal breath sounds.  Abdominal:     General: Bowel sounds are normal.     Palpations: Abdomen is soft.  Skin:    General: Skin is warm and dry.  Neurological:     Mental Status: She is alert and oriented to person, place, and time.         Latest Ref Rng & Units 07/01/2023    9:22 AM  CMP  Glucose 70 - 99 mg/dL 761   BUN 8 - 23 mg/dL 23   Creatinine 9.55 - 1.00 mg/dL 9.15   Sodium 864 - 854 mmol/L 135   Potassium 3.5 - 5.1 mmol/L 3.8   Chloride 98 - 111 mmol/L 104   CO2 22 - 32 mmol/L 24   Calcium  8.9 - 10.3 mg/dL 8.7   Total Protein 6.5 - 8.1 g/dL 7.2   Total Bilirubin 0.0 - 1.2 mg/dL 0.5   Alkaline Phos 38 - 126 U/L 60   AST 15 - 41 U/L 15   ALT 0 - 44 U/L 18       Latest Ref Rng & Units 07/01/2023    9:22 AM  CBC  WBC 4.0 - 10.5 K/uL 7.3   Hemoglobin 12.0 - 15.0 g/dL 88.3   Hematocrit 63.9 - 46.0 % 36.5   Platelets 150 - 400 K/uL 263     No images are attached to the encounter.  DG PAIN CLINIC C-ARM 1-60 MIN NO REPORT Result Date: 06/21/2023 Fluoro was used, but no Radiologist interpretation will be provided. Please  refer to NOTES tab for provider progress note.  IR IMAGING GUIDED PORT INSERTION Result Date: 06/14/2023 CLINICAL DATA:  Recurrent progressive lung cancer, access for chemotherapy EXAM: RIGHT INTERNAL JUGULAR SINGLE LUMEN POWER PORT CATHETER INSERTION Date:  06/14/2023 06/14/2023 10:06 am Radiologist:  M. Frederic Specking, MD Guidance:  Ultrasound and fluoroscopic MEDICATIONS: 1% lidocaine  local with epinephrine  ANESTHESIA/SEDATION: Versed  1.5 mg IV; Fentanyl  75 mcg IV; Moderate Sedation Time:  29 minutes The patient was  continuously monitored during the procedure by the interventional radiology nurse under my direct supervision. FLUOROSCOPY: 0 minutes, 48 seconds (10 mGy) COMPLICATIONS: None immediate. CONTRAST:  None. PROCEDURE: Informed consent was obtained from the patient following explanation of the procedure, risks, benefits and alternatives. The patient understands, agrees and consents for the procedure. All questions were addressed. A time out was performed. Maximal barrier sterile technique utilized including caps, mask, sterile gowns, sterile gloves, large sterile drape, hand hygiene, and 2% chlorhexidine  scrub. Under sterile conditions and local anesthesia, right internal jugular micropuncture venous access was performed. Access was performed with ultrasound. Images were obtained for documentation of the patent right internal jugular vein. A guide wire was inserted followed by a transitional dilator. This allowed insertion of a guide wire and catheter into the IVC. Measurements were obtained from the SVC / RA junction back to the right IJ venotomy site. In the right infraclavicular chest, a subcutaneous pocket was created over the second anterior rib. This was done under sterile conditions and local anesthesia. 1% lidocaine  with epinephrine  was utilized for this. A 2.5 cm incision was made in the skin. Blunt dissection was performed to create a subcutaneous pocket over the right pectoralis major muscle. The pocket was flushed with saline vigorously. There was adequate hemostasis. The port catheter was assembled and checked for leakage. The port catheter was secured in the pocket with two retention sutures. The tubing was tunneled subcutaneously to the right venotomy site and inserted into the SVC/RA junction through a valved peel-away sheath. Position was confirmed with fluoroscopy. Images were obtained for documentation. The patient tolerated the procedure well. No immediate complications. Incisions were closed in a two  layer fashion with 4 - 0 Vicryl suture. Dermabond was applied to the skin. The port catheter was accessed, blood was aspirated followed by saline and heparin  flushes. Needle was removed. A dry sterile dressing was applied. IMPRESSION: Ultrasound and fluoroscopically guided right internal jugular single lumen power port catheter insertion. Tip in the SVC/RA junction. Catheter ready for use. Electronically Signed   By: CHRISTELLA.  Shick M.D.   On: 06/14/2023 10:18     Assessment and plan- Patient is a 73 y.o. adult with history of stage I right upper lobe lung cancer s/p SBRT in 2022 now with biopsy-proven mediastinal recurrence consistent with adenocarcinoma.  She is here for on treatment assessment prior to cycle 1 of weekly CarboTaxol chemotherapy  I have reviewed PET CT scan results with the patient in detail which shows hypermetabolic mediastinal and right hilar adenopathy and masslike consolidation in the perihilar right lung.  This was biopsy-proven adenocarcinoma.  CT head was negative for metastatic disease.  Plan is to proceed with cycle 1 of weekly CarboTaxol chemotherapy today.  She will directly proceed for cycle 2 next week and I will see her back in 2 weeks for cycle 3.  Discussed risks and benefits of chemotherapy including all but not limited to nausea vomiting low blood counts, risk of infections and hospitalizations.  Treatment would be given with a curative intent.  Patient understands and agrees to proceed as planned   Visit Diagnosis 1. Encounter for antineoplastic chemotherapy   2. Recurrent non-small cell lung cancer (NSCLC) (HCC)      Dr. Annah Skene, MD, MPH [facility] [phone] 07/04/2023 5:15 PM

## 2023-07-04 NOTE — Telephone Encounter (Signed)
 Pt called in to report that she started to experience abdominal cramping that started yesterday. States that her cramping has improved today. Denies any NVD. States that bowel movements have been regular. Instructed pt to continue to monitor since her symptoms have improved and to call if symptoms worsen or notices any new symptoms. Pt verbalized understanding.

## 2023-07-04 NOTE — Telephone Encounter (Signed)
 Telephone call to patient for follow up after receiving first infusion.   Patient states infusion went great.  States eating good and drinking plenty of fluids.   Denies any nausea or vomiting. C/O feeling fatigue yesterday but much better today.   Encouraged patient to call for any concerns or questions.

## 2023-07-05 ENCOUNTER — Other Ambulatory Visit: Payer: Self-pay

## 2023-07-05 ENCOUNTER — Ambulatory Visit
Admission: RE | Admit: 2023-07-05 | Discharge: 2023-07-05 | Disposition: A | Discharge status: Home / Self Care | Payer: PPO | Source: Ambulatory Visit | Attending: Radiation Oncology | Admitting: Radiation Oncology

## 2023-07-05 DIAGNOSIS — C3411 Malignant neoplasm of upper lobe, right bronchus or lung: Secondary | ICD-10-CM | POA: Diagnosis not present

## 2023-07-05 DIAGNOSIS — Z510 Encounter for antineoplastic radiation therapy: Secondary | ICD-10-CM | POA: Diagnosis not present

## 2023-07-05 LAB — RAD ONC ARIA SESSION SUMMARY
Course Elapsed Days: 7
Plan Fractions Treated to Date: 5
Plan Prescribed Dose Per Fraction: 2 Gy
Plan Total Fractions Prescribed: 35
Plan Total Prescribed Dose: 70 Gy
Reference Point Dosage Given to Date: 10 Gy
Reference Point Session Dosage Given: 2 Gy
Session Number: 5

## 2023-07-06 ENCOUNTER — Other Ambulatory Visit: Payer: Self-pay

## 2023-07-06 ENCOUNTER — Emergency Department
Admission: EM | Admit: 2023-07-06 | Discharge: 2023-07-06 | Disposition: A | Discharge status: Home / Self Care | Payer: PPO | Attending: Emergency Medicine | Admitting: Emergency Medicine

## 2023-07-06 ENCOUNTER — Ambulatory Visit: Payer: PPO

## 2023-07-06 ENCOUNTER — Emergency Department: Payer: PPO

## 2023-07-06 ENCOUNTER — Ambulatory Visit: Admission: RE | Admit: 2023-07-06 | Payer: PPO | Source: Ambulatory Visit

## 2023-07-06 ENCOUNTER — Encounter: Payer: Self-pay | Admitting: Internal Medicine

## 2023-07-06 ENCOUNTER — Telehealth: Payer: Self-pay | Admitting: *Deleted

## 2023-07-06 DIAGNOSIS — K439 Ventral hernia without obstruction or gangrene: Secondary | ICD-10-CM | POA: Insufficient documentation

## 2023-07-06 DIAGNOSIS — M48061 Spinal stenosis, lumbar region without neurogenic claudication: Secondary | ICD-10-CM | POA: Insufficient documentation

## 2023-07-06 DIAGNOSIS — I6782 Cerebral ischemia: Secondary | ICD-10-CM | POA: Insufficient documentation

## 2023-07-06 DIAGNOSIS — C3411 Malignant neoplasm of upper lobe, right bronchus or lung: Secondary | ICD-10-CM | POA: Insufficient documentation

## 2023-07-06 DIAGNOSIS — K5900 Constipation, unspecified: Secondary | ICD-10-CM | POA: Diagnosis not present

## 2023-07-06 DIAGNOSIS — C3491 Malignant neoplasm of unspecified part of right bronchus or lung: Secondary | ICD-10-CM | POA: Diagnosis not present

## 2023-07-06 DIAGNOSIS — R109 Unspecified abdominal pain: Secondary | ICD-10-CM | POA: Diagnosis not present

## 2023-07-06 DIAGNOSIS — R112 Nausea with vomiting, unspecified: Secondary | ICD-10-CM | POA: Diagnosis not present

## 2023-07-06 DIAGNOSIS — K5641 Fecal impaction: Secondary | ICD-10-CM | POA: Insufficient documentation

## 2023-07-06 DIAGNOSIS — R1084 Generalized abdominal pain: Secondary | ICD-10-CM | POA: Diagnosis not present

## 2023-07-06 DIAGNOSIS — R519 Headache, unspecified: Secondary | ICD-10-CM | POA: Diagnosis not present

## 2023-07-06 DIAGNOSIS — K8020 Calculus of gallbladder without cholecystitis without obstruction: Secondary | ICD-10-CM | POA: Diagnosis not present

## 2023-07-06 DIAGNOSIS — I672 Cerebral atherosclerosis: Secondary | ICD-10-CM | POA: Diagnosis not present

## 2023-07-06 DIAGNOSIS — I10 Essential (primary) hypertension: Secondary | ICD-10-CM | POA: Diagnosis not present

## 2023-07-06 LAB — URINALYSIS, W/ REFLEX TO CULTURE (INFECTION SUSPECTED)
Bilirubin Urine: NEGATIVE
Glucose, UA: 50 mg/dL — AB
Hgb urine dipstick: NEGATIVE
Ketones, ur: 5 mg/dL — AB
Leukocytes,Ua: NEGATIVE
Nitrite: NEGATIVE
Protein, ur: NEGATIVE mg/dL
Specific Gravity, Urine: 1.017 (ref 1.005–1.030)
pH: 6 (ref 5.0–8.0)

## 2023-07-06 LAB — COMPREHENSIVE METABOLIC PANEL
ALT: 27 U/L (ref 0–44)
AST: 24 U/L (ref 15–41)
Albumin: 3.4 g/dL — ABNORMAL LOW (ref 3.5–5.0)
Alkaline Phosphatase: 44 U/L (ref 38–126)
Anion gap: 12 (ref 5–15)
BUN: 20 mg/dL (ref 8–23)
CO2: 19 mmol/L — ABNORMAL LOW (ref 22–32)
Calcium: 7.9 mg/dL — ABNORMAL LOW (ref 8.9–10.3)
Chloride: 105 mmol/L (ref 98–111)
Creatinine, Ser: 0.77 mg/dL (ref 0.44–1.00)
GFR, Estimated: >60 mL/min (ref >60)
Glucose, Bld: 238 mg/dL — ABNORMAL HIGH (ref 70–99)
Potassium: 4.1 mmol/L (ref 3.5–5.1)
Sodium: 136 mmol/L (ref 135–145)
Total Bilirubin: 1.1 mg/dL (ref 0.0–1.2)
Total Protein: 6.7 g/dL (ref 6.5–8.1)

## 2023-07-06 LAB — CBC
HCT: 44.5 % (ref 36.0–46.0)
Hemoglobin: 13.3 g/dL (ref 12.0–15.0)
MCH: 26.4 pg (ref 26.0–34.0)
MCHC: 29.9 g/dL — ABNORMAL LOW (ref 30.0–36.0)
MCV: 88.3 fL (ref 80.0–100.0)
Platelets: 232 10*3/uL (ref 150–400)
RBC: 5.04 MIL/uL (ref 3.87–5.11)
RDW: 14.8 % (ref 11.5–15.5)
WBC: 7.9 10*3/uL (ref 4.0–10.5)
nRBC: 0 % (ref 0.0–0.2)

## 2023-07-06 LAB — RESP PANEL BY RT-PCR (RSV, FLU A&B, COVID)  RVPGX2
Influenza A by PCR: NEGATIVE
Influenza B by PCR: NEGATIVE
Resp Syncytial Virus by PCR: NEGATIVE
SARS Coronavirus 2 by RT PCR: NEGATIVE

## 2023-07-06 LAB — LIPASE, BLOOD: Lipase: 28 U/L (ref 11–51)

## 2023-07-06 MED ORDER — ONDANSETRON HCL 4 MG/2ML IJ SOLN
4.0000 mg | Freq: Once | INTRAMUSCULAR | Status: AC
  Administered 2023-07-06: 4 mg via INTRAVENOUS
  Filled 2023-07-06: qty 2

## 2023-07-06 MED ORDER — SODIUM CHLORIDE 0.9 % IV BOLUS
1000.0000 mL | Freq: Once | INTRAVENOUS | Status: AC
  Administered 2023-07-06: 1000 mL via INTRAVENOUS

## 2023-07-06 MED ORDER — FLEET ENEMA RE ENEM
1.0000 | ENEMA | Freq: Once | RECTAL | Status: AC
  Administered 2023-07-06: 1 via RECTAL

## 2023-07-06 MED ORDER — POLYETHYLENE GLYCOL 3350 17 GM/SCOOP PO POWD: ORAL | 0 refills | Status: DC

## 2023-07-06 MED ORDER — ACETAMINOPHEN 500 MG PO TABS
1000.0000 mg | ORAL_TABLET | Freq: Once | ORAL | Status: AC
  Administered 2023-07-06: 1000 mg via ORAL
  Filled 2023-07-06: qty 2

## 2023-07-06 MED ORDER — BISACODYL 10 MG RE SUPP: 10.0000 mg | RECTAL | 0 refills | Status: AC | PRN

## 2023-07-06 MED ORDER — LACTATED RINGERS IV BOLUS
1000.0000 mL | Freq: Once | INTRAVENOUS | Status: AC
  Administered 2023-07-06: 1000 mL via INTRAVENOUS

## 2023-07-06 MED ORDER — HEPARIN SOD (PORK) LOCK FLUSH 100 UNIT/ML IV SOLN
INTRAVENOUS | Status: AC
  Administered 2023-07-06: 500 [IU]
  Filled 2023-07-06: qty 5

## 2023-07-06 NOTE — Telephone Encounter (Signed)
Per chart review pt now in ER

## 2023-07-06 NOTE — ED Triage Notes (Signed)
Arrives via EMS at 1102 EMS reports radiation Tx on monday  N/V, has meds for nausea but can't keep down EMS vitals 132/58, 98% RA, 72 HR  No Tx with EMS Unk what type of Cancer she has and unsure  No other Hx reported from EMS Pt Reports has radiation 5 days a week, new Dx cancer

## 2023-07-06 NOTE — Telephone Encounter (Signed)
Received msg from Selena Batten, RN in radiation  This patient called not feeling well can't come in for her radiation treatment. Her stomach is in knots, no BM in 3 days, and throwing up everything she takes. She cannot take her zofran because of throwing up. I have spoke with her offering suggestions but she has a reason not to do any of them. If you have any suggestions, would someone on this team give her a call.   I have attempted to call back, but no answer. Pt would need smc eval. Will send mychart msg to pt that call was being returned.

## 2023-07-06 NOTE — ED Provider Notes (Signed)
[facility] Provider Note    Event Date/Time   First MD Initiated Contact with Patient 07/06/23 1109     (approximate)   History   Nausea   HPI  Kara [name] is a 73 y.o. adult past medical history significant for recent recurrence of stage I right upper lobe lung cancer with recurrence with adenocarcinoma recently started on radiation and chemotherapy, who presents to the emergency department with nausea and vomiting.  Patient states that she started having nausea and vomiting last night.  Does state that she has been feeling constipated.  Attempted a suppository last night but was unsuccessful.  Complaining of abdominal cramping and pain.  Denies history of bowel obstruction.  No fever or chills.  No dysuria, urinary urgency or frequency.  Denies chest pain or shortness of breath.  States that she has been constipated and tried to do a suppository yesterday and was unsuccessful.  Attempted to drink MiraLAX yesterday but states that she started throwing it all up.     Physical Exam   Triage Vital Signs: ED Triage Vitals  Encounter Vitals Group     BP --      Systolic BP Percentile --      Diastolic BP Percentile --      Pulse --      Resp --      Temp --      Temp src --      SpO2 07/06/23 1106 98 %     Weight --      Height --      Head Circumference --      Peak Flow --      Pain Score 07/06/23 1109 2     Pain Loc --      Pain Education --      Exclude from Growth Chart --     Most recent vital signs: Vitals:   07/06/23 1132 07/06/23 1400  BP: 113/80 126/74  Pulse: 80 78  Resp: 19 17  Temp: 97.6 F (36.4 C)   SpO2: 100% 97%    Physical Exam Exam conducted with a chaperone present.  Constitutional:      Appearance: She is well-developed.  HENT:     Head: Atraumatic.     Mouth/Throat:     Mouth: Mucous membranes are dry.  Eyes:     Extraocular Movements: Extraocular movements intact.     Conjunctiva/sclera:  Conjunctivae normal.     Pupils: Pupils are equal, round, and reactive to light.  Cardiovascular:     Rate and Rhythm: Regular rhythm.  Pulmonary:     Effort: No respiratory distress.  Abdominal:     Tenderness: There is abdominal tenderness.  Genitourinary:    Comments: Rectal exam with small stool balls but no impaction. Musculoskeletal:     Cervical back: Normal range of motion.  Skin:    General: Skin is warm.     Capillary Refill: Capillary refill takes less than 2 seconds.  Neurological:     Mental Status: She is alert. Mental status is at baseline.  Psychiatric:        Mood and Affect: Mood normal.     IMPRESSION / MDM / ASSESSMENT AND PLAN / ED COURSE  I reviewed the triage vital signs and the nursing notes.  Differential diagnosis including side effect of chemotherapy, gastritis/PUD, small bowel obstruction, constipation, electrolyte abnormality, viral illness including COVID/influenza    No tachycardic or bradycardic dysrhythmias while on cardiac telemetry.  RADIOLOGY I independently reviewed imaging, my interpretation of imaging: CT scan of head without signs of intracranial hemorrhage or mass.  CT scan of the abdomen and pelvis without an obvious bowel obstruction but does have large stool ball.  LABS (all labs ordered are listed, but only abnormal results are displayed) Labs interpreted as -    Labs Reviewed  CBC - Abnormal; Notable for the following components:      Result Value   MCHC 29.9 (*)    All other components within normal limits  COMPREHENSIVE METABOLIC PANEL - Abnormal; Notable for the following components:   CO2 19 (*)    Glucose, Bld 238 (*)    Calcium 7.9 (*)    Albumin 3.4 (*)    All other components within normal limits  URINALYSIS, W/ REFLEX TO CULTURE (INFECTION SUSPECTED) - Abnormal; Notable for the following components:   Color, Urine AMBER (*)    APPearance CLOUDY (*)    Glucose, UA 50 (*)    Ketones, ur 5 (*)    Bacteria, UA  MANY (*)    All other components within normal limits  RESP PANEL BY RT-PCR (RSV, FLU A&B, COVID)  [id number]  LIPASE, BLOOD     MDM    Lab work overall unremarkable.  COVID and influenza testing are negative.  Given IV fluids.  Patient did have a bowel movement in the emergency department and states that she was feeling mildly better.  Care transferred to incoming provider with CT scan pending.   PROCEDURES:  Critical Care performed: No  .Fecal disimpaction  Date/Time: 07/06/2023 3:55 PM  Performed by: Corena Herter, MD Authorized by: Corena Herter, MD  Consent: Verbal consent obtained. Risks and benefits: risks, benefits and alternatives were discussed Consent given by: patient Patient understanding: patient states understanding of the procedure being performed Patient consent: the patient's understanding of the procedure matches consent given Procedure consent: procedure consent matches procedure scheduled Relevant documents: relevant documents present and verified Imaging studies: imaging studies available Required items: required blood products, implants, devices, and special equipment available Patient identity confirmed: verbally with patient and hospital-assigned identification number Local anesthesia used: no  Anesthesia: Local anesthesia used: no  Sedation: Patient sedated: no  Patient tolerance: patient tolerated the procedure well with no immediate complications     Patient's presentation is most consistent with acute presentation with potential threat to life or bodily function.   MEDICATIONS ORDERED IN ED: Medications  ondansetron (ZOFRAN) injection 4 mg (4 mg Intravenous Given 07/06/23 1248)  lactated ringers bolus 1,000 mL (0 mLs Intravenous Stopped 07/06/23 1441)  acetaminophen (TYLENOL) tablet 1,000 mg (1,000 mg Oral Given 07/06/23 1317)  sodium chloride 0.9 % bolus 1,000 mL (0 mLs Intravenous Stopped 07/06/23 1441)    FINAL CLINICAL IMPRESSION(S)  / ED DIAGNOSES   Final diagnoses:  Nausea and vomiting, unspecified vomiting type     Rx / DC Orders   ED Discharge Orders     None        Note:  This document was prepared using Dragon voice recognition software and may include unintentional dictation errors.   Corena Herter, MD 07/06/23 1556

## 2023-07-06 NOTE — Progress Notes (Unsigned)
PROVIDER NOTE: Information contained herein reflects review and annotations entered in association with encounter. Interpretation of such information and data should be left to medically-trained personnel. Information provided to patient can be located elsewhere in the medical record under "Patient Instructions". Document created using STT-dictation technology, any transcriptional errors that may result from process are unintentional.    Patient: Kara [name]  Service Category: E/M  Provider: Oswaldo Done, MD  DOB: [birth year]  DOS: 07/07/2023  Referring Provider: Barbette Reichmann, MD  MRN: [id number]  Specialty: Interventional Pain Management  PCP: Barbette Reichmann, MD  Type: Established Patient  Setting: Ambulatory outpatient    Location: Office  Delivery: Face-to-face     HPI  Kara [name], a 73 y.o. year old adult, is here today because of her Chronic bilateral low back pain without sciatica [M54.50, G89.29]. Kara [name] primary complain today is No chief complaint on file.  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); Lumbar pars defect (L3 and L4) (Bilateral); Diabetic peripheral neuropathy (HCC); Chronic lower extremity pain (2ry area of Pain) (Bilateral); Chronic radicular pain of lower extremity; Osteoarthritis of hip (Right); Chronic low back pain (1ry area of Pain) (Bilateral) w/o sciatica; Spondylosis  without myelopathy or radiculopathy, lumbosacral region; Other specified dorsopathies, sacral and sacrococcygeal region; Secondary osteoarthritis of multiple sites; Sacral insufficiency fracture; DDD (degenerative disc disease), lumbosacral; Coccygodynia; Chronic sacroiliac joint pain (Left); Osteoarthritis of lumbar spine; Spondylolisthesis, lumbosacral region; Abnormal MRI, lumbar spine (08/09/2019); Spasm of muscle of lower back; Chronic hip pain (Bilateral) (R>L); Osteoarthritis of hips (Bilateral); Greater trochanteric bursitis (Right); Chronic hip pain (Right); Chronic low back pain (Right) w/o sciatica; Greater trochanteric bursitis (Left); Other bursitis of hip, not elsewhere classified (gluteus medius bursa) (Left); Lumbar facet joint pain; Piriformis muscle pain (Right); Abdominal pain; Chest pain in adult; Chest pain; Mediastinal adenopathy; Cancer of upper lobe of right lung (HCC); and Recurrent non-small cell lung cancer (NSCLC) (HCC) on their pertinent problem list. Pain Assessment: Severity of   is reported as a  /10. Location:    / . Onset:  . Quality:  . Timing:  . Modifying factor(s):  Marland Kitchen Vitals:  vitals were not taken for this visit.  BMI: Estimated body mass index is 34.84 kg/m as calculated from the following:   Height as of 06/21/23: 5\' 3"  (1.6 m).   Weight as of 07/01/23: 196 lb 11.2 oz (89.2 kg). Last encounter: 06/13/2023. Last procedure: 06/21/2023.  Reason for encounter: post-procedure evaluation and assessment. ***  Discussed the use of AI scribe software for clinical note transcription with the patient, who gave verbal consent to proceed.  History of Present Illness           Post-procedure evaluation   Type: Lumbar Facet, Medial Branch Block(s)   [id number]   Laterality: Bilateral  Level: L2, L3, L4, L5, and S1 Medial Branch Level(s). Injecting these levels blocks the L3-4, L4-5, and L5-S1 lumbar facet joints. Imaging: Fluoroscopic guidance Spinal ([id number]) Anesthesia:  Local anesthesia (1-2% Lidocaine) Anxiolysis: IV Versed 2.0 mg Sedation: Moderate Sedation Fentanyl 0.5 mL (25  mcg) DOS: 06/21/2023 Performed by: Oswaldo Done, MD  Primary Purpose: Diagnostic/Therapeutic Indications: Low back pain severe enough to impact quality of life or function. 1. Chronic low back pain (1ry area of Pain) (Bilateral) w/o sciatica   2. 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 Lumbar facet joint pain   5. Lumbar facet arthropathy (L3-4, L4-5, and L5-S1) (Bilateral)   6. Lumbar facet syndrome (Bilateral)   7. 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 Spondylosis without myelopathy or radiculopathy, lumbosacral region   9. Abnormal MRI, lumbar spine (08/09/2019)    Chronic anticoagulation (COUMADIN)    Latex precautions, history of latex allergy    Truncal obesity    NAS-11 Pain score:   Pre-procedure: 8 /10   Post-procedure: 0-No pain/10     Effectiveness:  Initial hour after procedure:   ***. Subsequent 4-6 hours post-procedure:   ***. Analgesia past initial 6 hours:   ***. Ongoing improvement:  Analgesic:  *** Function:    ***    ROM:    ***     Pharmacotherapy Assessment  Analgesic: No chronic opioid analgesics therapy prescribed by our practice, 2ry to Medication Agreement Violation. Oxycodone IR 10 mg, 1 tab PO qd,  from PCP.  (Getting oxycodone from PCP in multiple occasions, according to PMP)(11/21/20; 12/26/20; 01/16/21; 02/13/21). Opioid analgesic pharmacotherapy D/C'ed on 03/03/2021. MME: 20 mg/day.   Monitoring: [place] PMP: PDMP reviewed during this encounter.       Pharmacotherapy: No side-effects or adverse reactions reported. Compliance: No problems identified. Effectiveness: Clinically acceptable.  No notes on file  No results found for: "CBDTHCR" No results found for: "D8THCCBX" No results found for: "D9THCCBX"  UDS:  Summary  Date  Value Ref Range Status  11/12/2020 Note  Final    Comment:    ==================================================================== [employer] Select 13 (MW) ==================================================================== Test                             Result       Flag       Units  Drug Present and Declared for Prescription Verification   Norhydrocodone                 61           EXPECTED   ng/mg creat    Norhydrocodone is an expected metabolite of hydrocodone.    Tramadol                       >4202        EXPECTED   ng/mg creat   O-Desmethyltramadol            >4202        EXPECTED   ng/mg creat   N-Desmethyltramadol            1712         EXPECTED   ng/mg creat    Source of tramadol is a prescription medication. O-desmethyltramadol    and N-desmethyltramadol are expected metabolites of tramadol.  Drug Absent but Declared for Prescription Verification   Hydrocodone                    Not Detected UNEXPECTED ng/mg creat    Hydrocodone is almost always present in patients taking this drug    consistently. Absence  of hydrocodone could be due to lapse of time    since the last dose or unusual pharmacokinetics (rapid metabolism).  ==================================================================== Test                      Result    Flag   Units      Ref Range   Creatinine              119              mg/dL      >=40 ==================================================================== Declared Medications:  The flagging and interpretation on this report are based on the  following declared medications.  Unexpected results may arise from  inaccuracies in the declared medications.   **Note: The testing scope of this panel includes these medications:   Hydrocodone  Tramadol   **Note: The testing scope of this panel does not include the  following reported medications:   Acetaminophen  Alendronate (Fosamax)  Aspirin  Calcium  Candesartan  Cholecalciferol   Cyanocobalamin  Cyclobenzaprine  Dulaglutide  Gabapentin  Glipizide  Meclizine  Multivitamin  Nystatin  Ondansetron  Ropinirole  Tolterodine  Warfarin ==================================================================== For clinical consultation, please call [phone]. ====================================================================       ROS  Constitutional: Denies any fever or chills Gastrointestinal: No reported hemesis, hematochezia, vomiting, or acute GI distress Musculoskeletal: Denies any acute onset joint swelling, redness, loss of ROM, or weakness Neurological: No reported episodes of acute onset apraxia, aphasia, dysarthria, agnosia, amnesia, paralysis, loss of coordination, or loss of consciousness  Medication Review  Dulaglutide, HYDROcodone-acetaminophen, Magnesium Oxide -Mg Supplement, Vitamin B-12, calcium carbonate, candesartan, cholecalciferol, cyclobenzaprine, dexamethasone, gabapentin, glipiZIDE, glucose blood, meclizine, ondansetron, oxybutynin, prochlorperazine, rOPINIRole, and warfarin  History Review  Allergy: Kara [name] is allergic to adhesive [tape], other, latex, and morphine and codeine. Drug: Kara [name]  reports no history of drug use. Alcohol:  reports no history of alcohol use. Tobacco:  reports that she has never smoked. She has been exposed to tobacco smoke. She has never used smokeless tobacco. Social: Kara [name]  reports that she has never smoked. She has been exposed to tobacco smoke. She has never used smokeless tobacco. She reports that she does not drink alcohol and does not use drugs. Medical:  has a past medical history of Anginal pain (HCC), Arthritis, CHF (congestive heart failure) (HCC), Chronic anticoagulation, Degenerative disc disease, lumbar, Diverticulitis (2018), Diverticulosis, DVT of lower extremity, bilateral (HCC) (2018), Essential hypertension, History of being hospitalized, History of hiatal hernia, Mediastinal  adenopathy (04/2023), MRSA nasal colonization, Non-small cell lung cancer (HCC) (2022), Obesity (BMI 30-39.9), Osteoarthritis, Osteoporosis, PE (pulmonary thromboembolism) (HCC) (2018), Pneumonia, Small bowel obstruction (HCC) (03/20/2023), 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HCC), Type 2 diabetes mellitus with obesity (HCC), and Vertigo. Surgical: Kara [name]  has a past surgical history that includes Knee surgery (Right); Carpal tunnel release (Bilateral); Colon resection sigmoid (N/A, 11/02/2016); Colostomy (N/A, 11/02/2016); Sigmoidoscopy (N/A, 11/13/2016); PULMONARY VENOGRAPHY (N/A, 11/19/2016); Colonoscopy with propofol (N/A, 02/03/2017); Colon surgery (11/02/2016); IVC FILTER INSERTION (Right, 10/2016); Colostomy reversal (N/A, 02/24/2017); Appendectomy (N/A, 02/24/2017); Lysis of adhesion (N/A, 02/24/2017); laparoscopy (N/A, 02/24/2017); Ileo loop diversion (N/A, 02/24/2017); Sigmoidoscopy (N/A, 05/19/2017); Ileostomy closure (N/A, 06/01/2017); IVC FILTER REMOVAL (N/A, 08/09/2017); Sacroplasty (N/A, 11/08/2017); Cataract extraction w/PHACO (Left, 11/21/2018); Cataract extraction w/PHACO (Right, 12/12/2018); Video bronchoscopy with endobronchial navigation (N/A, 02/23/2021); Flexible bronchoscopy (N/A, 05/16/2023); Endobronchial ultrasound (N/A, 05/16/2023); and IR IMAGING GUIDED PORT INSERTION (06/14/2023). Family: family history includes Breast cancer in her mother; Cancer in her  mother; Diabetes in her mother and sister; Heart disease in her father, mother, and sister.  Laboratory Chemistry Profile   Renal Lab Results  Component Value Date   BUN 23 07/01/2023   CREATININE 0.84 07/01/2023   GFRAA >60 07/09/2019   GFRNONAA >60 07/01/2023    Hepatic Lab Results  Component Value Date   AST 15 07/01/2023   ALT 18 07/01/2023   ALBUMIN 3.5 07/01/2023   ALKPHOS 60 07/01/2023   LIPASE 34 03/20/2023    Electrolytes Lab Results  Component Value Date   NA 135 07/01/2023   K 3.8 07/01/2023   CL  104 07/01/2023   CALCIUM 8.7 (L) 07/01/2023   MG 2.1 03/22/2023   PHOS 4.0 03/22/2023    Bone Lab Results  Component Value Date   25OHVITD1 25 (L) 11/02/2017   25OHVITD2 <1.0 11/02/2017   25OHVITD3 25 11/02/2017    Inflammation (CRP: Acute Phase) (ESR: Chronic Phase) Lab Results  Component Value Date   CRP 1.7 (H) 08/15/2020   ESRSEDRATE 12 11/02/2017   LATICACIDVEN 1.7 11/21/2021         Note: Above Lab results reviewed.  Recent Imaging Review  DG PAIN CLINIC C-ARM 1-60 MIN NO REPORT Fluoro was used, but no Radiologist interpretation will be provided.  Please refer to "NOTES" tab for provider progress note. Note: Reviewed        Physical Exam  General appearance: Well nourished, well developed, and well hydrated. In no apparent acute distress Mental status: Alert, oriented x 3 (person, place, & time)       Respiratory: No evidence of acute respiratory distress Eyes: PERLA Vitals: There were no vitals taken for this visit. BMI: Estimated body mass index is 34.84 kg/m as calculated from the following:   Height as of 06/21/23: 5\' 3"  (1.6 m).   Weight as of 07/01/23: 196 lb 11.2 oz (89.2 kg). Ideal: Ideal body weight: 52.4 kg (115 lb 8.3 oz) Adjusted ideal body weight: 67.1 kg (147 lb 15.9 oz)  Assessment   Diagnosis Status  1. Chronic low back pain (1ry area of Pain) (Bilateral) w/o sciatica   2. Lumbar facet joint pain   3. Lumbar facet syndrome (Bilateral)   4. Postop check    Controlled Controlled Controlled   Updated Problems: No problems updated.  Plan of Care  Problem-specific:  Assessment and Plan            Kara [name] has a current medication list which includes the following long-term medication(s): calcium carbonate, candesartan, glipizide, prochlorperazine, and ropinirole.  Pharmacotherapy (Medications Ordered): No orders of the defined types were placed in this encounter.  Orders:  No orders of the defined types were placed in  this encounter.  Follow-up plan:   No follow-ups on file.      Interventional Therapies  Risk  Complexity Considerations:   ANTICOAGULATION: Coumadin (Stop: 5 days  Re-start: 2hrs)  Hx DVT & PE ALLERGY: Latex Morbid obesity (class III) (BMI>40)  metabolic syndrome  NIDDM  HTN Difficult Lumbar RFA due to morbid obesity    Planned  Pending: Palliative bilateral lumbar facet MBB [id number]    Under consideration:   Therapeutic bilateral lumbar facet RFA #2 (patient indicates preferring to have only the block without radiofrequency.)   Completed:   Therapeutic bilateral lumbar facet MBB [id number] (03/15/2023) (25/25/50/50)  Diagnostic/therapeutic right IA hip inj. x2 (01/28/2022) (100/100/85/85)  Diagnostic/therapeutic left IA hip inj. x1 (01/28/2022) (100/100/85/85)  Diagnostic/therapeutic right trochanteric bursa inj. x1 (09/15/2021) (85/85/85)  Diagnostic/therapeutic left trochanteric bursa inj. x1 (01/28/2022) (100/100/85/85)  Therapeutic left lumbar facet RFA x1 (07/21/2021) (100/100/100 x1 week/80) (Lasted 9 months)  Therapeutic right lumbar facet RFA x1 (07/07/2021) (100/100/80/80) (Lasted 9 months)  Palliative/Therapeutic left L4-5 LESI x1 (11/10/2017) (100/80/0/0-25)  Diagnostic/therapeutic right L4 TFESI x1 (11/19/2021) (90/80/0/0) (fell at [employer]) Palliative right SI joint block x9 (01/28/2022) (100/100/80/80)  Diagnostic left SI joint block x2 (01/28/2022) (100/100/80/80)  Palliative/Therapeutic (Midline) caudal ESI x3 (06/08/2018) (100/100/100 x 2-3 days/0)    Therapeutic  Palliative (PRN) options:   NO PRN procedures without F2F exam by Dr. Laban Emperor due to multifactorial etiology of chronic pain. No more procedures until after 08/14/2022.    Pharmacotherapy  Nonopioids transferred 03/27/2020: Calcium       Recent Visits Date Type Provider Dept  06/21/23 Procedure visit Delano Metz, MD [name]  06/13/23 Office Visit Delano Metz, MD  [name]  04/07/23 Office Visit Delano Metz, MD [name]  Showing recent visits within past 90 days and meeting all other requirements Future Appointments Date Type Provider Dept  07/07/23 Appointment Delano Metz, MD [name]  Showing future appointments within next 90 days and meeting all other requirements  I discussed the assessment and treatment plan with the patient. The patient was provided an opportunity to ask questions and all were answered. The patient agreed with the plan and demonstrated an understanding of the instructions.  Patient advised to call back or seek an in-person evaluation if the symptoms or condition worsens.  Duration of encounter: *** minutes.  Total time on encounter, as per AMA guidelines included both the face-to-face and non-face-to-face time personally spent by the physician and/or other qualified health care professional(s) on the day of the encounter (includes time in activities that require the physician or other qualified health care professional and does not include time in activities normally performed by clinical staff). Physician's time may include the following activities when performed: Preparing to see the patient (e.g., pre-charting review of records, searching for previously ordered imaging, lab work, and nerve conduction tests) Review of prior analgesic pharmacotherapies. Reviewing PMP Interpreting ordered tests (e.g., lab work, imaging, nerve conduction tests) Performing post-procedure evaluations, including interpretation of diagnostic procedures Obtaining and/or reviewing separately obtained history Performing a medically appropriate examination and/or evaluation Counseling and educating the patient/family/caregiver Ordering medications, tests, or procedures Referring and communicating with other health care professionals (when not separately reported) Documenting clinical information in the  electronic or other health record Independently interpreting results (not separately reported) and communicating results to the patient/ family/caregiver Care coordination (not separately reported)  Note by: Oswaldo Done, MD Date: 07/07/2023; Time: 9:41 AM

## 2023-07-06 NOTE — ED Provider Notes (Signed)
Procedures  Clinical Course as of 07/06/23 1817  Wed Jul 06, 2023  1714 CT reveals CN. Was disimpacted by Dr. Arnoldo Morale and passed a few small stool balls afterward. Abd soft, hernia not incarcerated. Will try enema and PO soup/coffee. If not BM and sx improvement, will need to admit.  [PS]    Clinical Course User Index [PS] Sharman Cheek, MD    ----------------------------------------- 6:17 PM on 07/06/2023 -----------------------------------------   Pt drank coffee, tolerated PO. After enema, she had large BM and feels much better. Suitable for DC and outpt laxatives   Sharman Cheek, MD 07/06/23 1818

## 2023-07-07 ENCOUNTER — Ambulatory Visit: Payer: PPO | Admitting: Pain Medicine

## 2023-07-07 ENCOUNTER — Encounter: Payer: Self-pay | Admitting: Internal Medicine

## 2023-07-07 ENCOUNTER — Ambulatory Visit: Payer: PPO

## 2023-07-07 DIAGNOSIS — M47816 Spondylosis without myelopathy or radiculopathy, lumbar region: Secondary | ICD-10-CM

## 2023-07-07 DIAGNOSIS — Z09 Encounter for follow-up examination after completed treatment for conditions other than malignant neoplasm: Secondary | ICD-10-CM

## 2023-07-07 DIAGNOSIS — G8929 Other chronic pain: Secondary | ICD-10-CM

## 2023-07-07 DIAGNOSIS — M5459 Other low back pain: Secondary | ICD-10-CM

## 2023-07-07 DIAGNOSIS — Z91199 Patient's noncompliance with other medical treatment and regimen due to unspecified reason: Secondary | ICD-10-CM

## 2023-07-08 ENCOUNTER — Other Ambulatory Visit: Payer: PPO

## 2023-07-08 ENCOUNTER — Inpatient Hospital Stay: Payer: PPO

## 2023-07-08 ENCOUNTER — Ambulatory Visit
Admission: RE | Admit: 2023-07-08 | Discharge: 2023-07-08 | Disposition: A | Discharge status: Home / Self Care | Payer: PPO | Source: Ambulatory Visit | Attending: Radiation Oncology | Admitting: Radiation Oncology

## 2023-07-08 ENCOUNTER — Encounter: Payer: Self-pay | Admitting: *Deleted

## 2023-07-08 ENCOUNTER — Ambulatory Visit: Payer: PPO

## 2023-07-08 ENCOUNTER — Other Ambulatory Visit: Payer: Self-pay

## 2023-07-08 ENCOUNTER — Ambulatory Visit: Payer: PPO | Admitting: Oncology

## 2023-07-08 VITALS — BP 152/76 | HR 82 | Temp 97.0°F | Resp 16 | Wt 195.8 lb

## 2023-07-08 DIAGNOSIS — C3411 Malignant neoplasm of upper lobe, right bronchus or lung: Secondary | ICD-10-CM

## 2023-07-08 DIAGNOSIS — Z510 Encounter for antineoplastic radiation therapy: Secondary | ICD-10-CM | POA: Diagnosis not present

## 2023-07-08 LAB — CBC WITH DIFFERENTIAL (CANCER CENTER ONLY)
Abs Immature Granulocytes: 0.03 10*3/uL (ref 0.00–0.07)
Basophils Absolute: 0 10*3/uL (ref 0.0–0.1)
Basophils Relative: 0 %
Eosinophils Absolute: 0.1 10*3/uL (ref 0.0–0.5)
Eosinophils Relative: 2 %
HCT: 33.4 % — ABNORMAL LOW (ref 36.0–46.0)
Hemoglobin: 10.6 g/dL — ABNORMAL LOW (ref 12.0–15.0)
Immature Granulocytes: 1 %
Lymphocytes Relative: 18 %
Lymphs Abs: 0.7 10*3/uL (ref 0.7–4.0)
MCH: 27 pg (ref 26.0–34.0)
MCHC: 31.7 g/dL (ref 30.0–36.0)
MCV: 85.2 fL (ref 80.0–100.0)
Monocytes Absolute: 0.3 10*3/uL (ref 0.1–1.0)
Monocytes Relative: 7 %
Neutro Abs: 2.7 10*3/uL (ref 1.7–7.7)
Neutrophils Relative %: 72 %
Platelet Count: 190 10*3/uL (ref 150–400)
RBC: 3.92 MIL/uL (ref 3.87–5.11)
RDW: 14.5 % (ref 11.5–15.5)
WBC Count: 3.7 10*3/uL — ABNORMAL LOW (ref 4.0–10.5)
nRBC: 0 % (ref 0.0–0.2)

## 2023-07-08 LAB — CMP (CANCER CENTER ONLY)
ALT: 18 U/L (ref 0–44)
AST: 15 U/L (ref 15–41)
Albumin: 3.1 g/dL — ABNORMAL LOW (ref 3.5–5.0)
Alkaline Phosphatase: 41 U/L (ref 38–126)
Anion gap: 7 (ref 5–15)
BUN: 13 mg/dL (ref 8–23)
CO2: 22 mmol/L (ref 22–32)
Calcium: 8.2 mg/dL — ABNORMAL LOW (ref 8.9–10.3)
Chloride: 108 mmol/L (ref 98–111)
Creatinine: 0.61 mg/dL (ref 0.44–1.00)
GFR, Estimated: >60 mL/min (ref >60)
Glucose, Bld: 215 mg/dL — ABNORMAL HIGH (ref 70–99)
Potassium: 3.3 mmol/L — ABNORMAL LOW (ref 3.5–5.1)
Sodium: 137 mmol/L (ref 135–145)
Total Bilirubin: 0.4 mg/dL (ref 0.0–1.2)
Total Protein: 6 g/dL — ABNORMAL LOW (ref 6.5–8.1)

## 2023-07-08 LAB — RAD ONC ARIA SESSION SUMMARY
Course Elapsed Days: 10
Plan Fractions Treated to Date: 6
Plan Prescribed Dose Per Fraction: 2 Gy
Plan Total Fractions Prescribed: 35
Plan Total Prescribed Dose: 70 Gy
Reference Point Dosage Given to Date: 12 Gy
Reference Point Session Dosage Given: 2 Gy
Session Number: 6

## 2023-07-08 MED ORDER — SODIUM CHLORIDE 0.9 % IV SOLN
45.0000 mg/m2 | Freq: Once | INTRAVENOUS | Status: AC
  Administered 2023-07-08: 90 mg via INTRAVENOUS
  Filled 2023-07-08: qty 15

## 2023-07-08 MED ORDER — HEPARIN SOD (PORK) LOCK FLUSH 100 UNIT/ML IV SOLN
500.0000 [IU] | Freq: Once | INTRAVENOUS | Status: AC | PRN
  Administered 2023-07-08: 500 [IU]
  Filled 2023-07-08: qty 5

## 2023-07-08 MED ORDER — FAMOTIDINE IN NACL 20-0.9 MG/50ML-% IV SOLN
20.0000 mg | Freq: Once | INTRAVENOUS | Status: AC
  Administered 2023-07-08: 20 mg via INTRAVENOUS
  Filled 2023-07-08: qty 50

## 2023-07-08 MED ORDER — SODIUM CHLORIDE 0.9 % IV SOLN
INTRAVENOUS | Status: DC
  Filled 2023-07-08: qty 250

## 2023-07-08 MED ORDER — PALONOSETRON HCL INJECTION 0.25 MG/5ML
0.2500 mg | Freq: Once | INTRAVENOUS | Status: AC
  Administered 2023-07-08: 0.25 mg via INTRAVENOUS
  Filled 2023-07-08: qty 5

## 2023-07-08 MED ORDER — DIPHENHYDRAMINE HCL 50 MG/ML IJ SOLN
50.0000 mg | Freq: Once | INTRAMUSCULAR | Status: AC
  Administered 2023-07-08: 50 mg via INTRAVENOUS
  Filled 2023-07-08: qty 1

## 2023-07-08 MED ORDER — DEXAMETHASONE SODIUM PHOSPHATE 10 MG/ML IJ SOLN
10.0000 mg | Freq: Once | INTRAMUSCULAR | Status: AC
  Administered 2023-07-08: 10 mg via INTRAVENOUS
  Filled 2023-07-08: qty 1

## 2023-07-08 MED ORDER — SODIUM CHLORIDE 0.9 % IV SOLN
200.0000 mg | Freq: Once | INTRAVENOUS | Status: AC
  Administered 2023-07-08: 200 mg via INTRAVENOUS
  Filled 2023-07-08: qty 20

## 2023-07-08 MED ORDER — POTASSIUM CHLORIDE CRYS ER 20 MEQ PO TBCR: 20.0000 meq | EXTENDED_RELEASE_TABLET | Freq: Every day | ORAL | 0 refills | Status: DC

## 2023-07-08 MED ORDER — POTASSIUM CHLORIDE 20 MEQ/100ML IV SOLN
20.0000 meq | Freq: Once | INTRAVENOUS | Status: AC
  Administered 2023-07-08: 20 meq via INTRAVENOUS

## 2023-07-08 NOTE — Progress Notes (Signed)
Pt stated she was in ED 2 days ago with nausea/vomiting/bowel impaction.  Pt given enema with relief.  Pt had BM at home  yesterday morning.  Pt feeling well this morning.  Dr Smith Robert notified.  Okay to proceed with treatment if labs WNL.

## 2023-07-08 NOTE — Patient Instructions (Signed)
[facility] - A DEPT OF [facility]  Discharge Instructions: Thank you for choosing [facility] to provide your oncology and hematology care.  If you have a lab appointment with the Cancer Center, please go directly to the Cancer Center and check in at the registration area.  Wear comfortable clothing and clothing appropriate for easy access to any Portacath or PICC line.   We strive to give you quality time with your provider. You may need to reschedule your appointment if you arrive late (15 or more minutes).  Arriving late affects you and other patients whose appointments are after yours.  Also, if you miss three or more appointments without notifying the office, you may be dismissed from the clinic at the provider's discretion.      For prescription refill requests, have your pharmacy contact our office and allow 72 hours for refills to be completed.    Today you received the following chemotherapy and/or immunotherapy agents Paclitaxel and Carboplatin      To help prevent nausea and vomiting after your treatment, we encourage you to take your nausea medication as directed.  BELOW ARE SYMPTOMS THAT SHOULD BE REPORTED IMMEDIATELY: *FEVER GREATER THAN 100.4 F (38 C) OR HIGHER *CHILLS OR SWEATING *NAUSEA AND VOMITING THAT IS NOT CONTROLLED WITH YOUR NAUSEA MEDICATION *UNUSUAL SHORTNESS OF BREATH *UNUSUAL BRUISING OR BLEEDING *URINARY PROBLEMS (pain or burning when urinating, or frequent urination) *BOWEL PROBLEMS (unusual diarrhea, constipation, pain near the anus) TENDERNESS IN MOUTH AND THROAT WITH OR WITHOUT PRESENCE OF ULCERS (sore throat, sores in mouth, or a toothache) UNUSUAL RASH, SWELLING OR PAIN  UNUSUAL VAGINAL DISCHARGE OR ITCHING   Items with * indicate a potential emergency and should be followed up as soon as possible or go to the Emergency Department if any problems should occur.  Please show the CHEMOTHERAPY ALERT CARD or  IMMUNOTHERAPY ALERT CARD at check-in to the Emergency Department and triage nurse.  Should you have questions after your visit or need to cancel or reschedule your appointment, please contact [facility] - A DEPT OF [facility]  [phone] and follow the prompts.  Office hours are 8:00 a.m. to 4:30 p.m. Monday - Friday. Please note that voicemails left after 4:00 p.m. may not be returned until the following business day.  We are closed weekends and major holidays. You have access to a nurse at all times for urgent questions. Please call the main number to the clinic [phone] and follow the prompts.  For any non-urgent questions, you may also contact your provider using MyChart. We now offer e-Visits for anyone 39 and older to request care online for non-urgent symptoms. For details visit [email or web address].   Also download the MyChart app! Go to the app store, search "MyChart", open the app, select [facility], and log in with your MyChart username and password.

## 2023-07-08 NOTE — Progress Notes (Signed)
Continue to cap Carboplatin dose with creatinine at 1 due to age per Dr Smith Robert   Carboplatin AUC 2 = 200 mg   T.O. Dr Dorothyann Gibbs, PharmD

## 2023-07-11 ENCOUNTER — Ambulatory Visit: Payer: PPO

## 2023-07-12 ENCOUNTER — Inpatient Hospital Stay: Payer: PPO

## 2023-07-12 ENCOUNTER — Inpatient Hospital Stay (HOSPITAL_BASED_OUTPATIENT_CLINIC_OR_DEPARTMENT_OTHER): Payer: PPO | Admitting: Hospice and Palliative Medicine

## 2023-07-12 ENCOUNTER — Ambulatory Visit
Admission: RE | Admit: 2023-07-12 | Discharge: 2023-07-12 | Disposition: A | Discharge status: Home / Self Care | Payer: PPO | Source: Ambulatory Visit | Attending: Radiation Oncology | Admitting: Radiation Oncology

## 2023-07-12 ENCOUNTER — Encounter: Payer: Self-pay | Admitting: Hospice and Palliative Medicine

## 2023-07-12 ENCOUNTER — Other Ambulatory Visit: Payer: Self-pay

## 2023-07-12 VITALS — BP 104/43 | HR 84 | Resp 20 | Wt 191.6 lb

## 2023-07-12 DIAGNOSIS — C3411 Malignant neoplasm of upper lobe, right bronchus or lung: Secondary | ICD-10-CM

## 2023-07-12 DIAGNOSIS — Z7901 Long term (current) use of anticoagulants: Secondary | ICD-10-CM | POA: Diagnosis not present

## 2023-07-12 DIAGNOSIS — Z510 Encounter for antineoplastic radiation therapy: Secondary | ICD-10-CM | POA: Diagnosis not present

## 2023-07-12 DIAGNOSIS — E860 Dehydration: Secondary | ICD-10-CM

## 2023-07-12 DIAGNOSIS — Z86711 Personal history of pulmonary embolism: Secondary | ICD-10-CM | POA: Diagnosis not present

## 2023-07-12 LAB — RAD ONC ARIA SESSION SUMMARY
Course Elapsed Days: 14
Plan Fractions Treated to Date: 7
Plan Prescribed Dose Per Fraction: 2 Gy
Plan Total Fractions Prescribed: 35
Plan Total Prescribed Dose: 70 Gy
Reference Point Dosage Given to Date: 14 Gy
Reference Point Session Dosage Given: 2 Gy
Session Number: 7

## 2023-07-12 LAB — CMP (CANCER CENTER ONLY)
ALT: 27 U/L (ref 0–44)
AST: 21 U/L (ref 15–41)
Albumin: 3.6 g/dL (ref 3.5–5.0)
Alkaline Phosphatase: 39 U/L (ref 38–126)
Anion gap: 11 (ref 5–15)
BUN: 17 mg/dL (ref 8–23)
CO2: 23 mmol/L (ref 22–32)
Calcium: 9.1 mg/dL (ref 8.9–10.3)
Chloride: 100 mmol/L (ref 98–111)
Creatinine: 0.95 mg/dL (ref 0.44–1.00)
GFR, Estimated: >60 mL/min (ref >60)
Glucose, Bld: 192 mg/dL — ABNORMAL HIGH (ref 70–99)
Potassium: 4.4 mmol/L (ref 3.5–5.1)
Sodium: 134 mmol/L — ABNORMAL LOW (ref 135–145)
Total Bilirubin: 0.7 mg/dL (ref 0.0–1.2)
Total Protein: 6.8 g/dL (ref 6.5–8.1)

## 2023-07-12 LAB — CBC WITH DIFFERENTIAL (CANCER CENTER ONLY)
Abs Immature Granulocytes: 0.02 10*3/uL (ref 0.00–0.07)
Basophils Absolute: 0 10*3/uL (ref 0.0–0.1)
Basophils Relative: 0 %
Eosinophils Absolute: 0 10*3/uL (ref 0.0–0.5)
Eosinophils Relative: 1 %
HCT: 38.6 % (ref 36.0–46.0)
Hemoglobin: 12.3 g/dL (ref 12.0–15.0)
Immature Granulocytes: 1 %
Lymphocytes Relative: 18 %
Lymphs Abs: 0.7 10*3/uL (ref 0.7–4.0)
MCH: 27.1 pg (ref 26.0–34.0)
MCHC: 31.9 g/dL (ref 30.0–36.0)
MCV: 85 fL (ref 80.0–100.0)
Monocytes Absolute: 0.1 10*3/uL (ref 0.1–1.0)
Monocytes Relative: 3 %
Neutro Abs: 3.1 10*3/uL (ref 1.7–7.7)
Neutrophils Relative %: 77 %
Platelet Count: 202 10*3/uL (ref 150–400)
RBC: 4.54 MIL/uL (ref 3.87–5.11)
RDW: 15.1 % (ref 11.5–15.5)
WBC Count: 4.1 10*3/uL (ref 4.0–10.5)
nRBC: 0 % (ref 0.0–0.2)

## 2023-07-12 LAB — MAGNESIUM: Magnesium: 1.9 mg/dL (ref 1.7–2.4)

## 2023-07-12 NOTE — Progress Notes (Signed)
Symptom Management Clinic [facility] at [facility] [phone] [phone]  Patient Care Team: Barbette Reichmann, MD as PCP - General (Internal Medicine) Glory Buff, RN as Oncology Nurse Navigator Creig Hines, MD as Consulting Physician (Oncology)   NAME OF PATIENT: Kara [name]  [id number]  [birth year]   DATE OF VISIT: 07/12/23  REASON FOR CONSULT: Brigida Scotti is a 73 y.o. adult with multiple medical problems including history of stage I right upper lobe lung cancer status post SBRT in 2022, now with biopsy proven mediastinal recurrence.  INTERVAL HISTORY: Patient is on concurrent chemoradiation.  She received cycle 2 CarboTaxol on 07/08/2023.  Patient was here for XRT and was referred to [facility] for evaluation of hypotension.  Patient states that she had an episode of nausea and vomiting over the weekend but none since.  Denies fever or chills.  Reports that her appetite has been reduced.  Had a regular bowel movement today.   Patient denies significant symptomatic complaints today.  Overall, she says that she is feeling better than she has over the past several days.  Denies any neurologic complaints. Denies recent fevers or illnesses. Denies any easy bleeding or bruising. Denies chest pain. Denies urinary complaints. Patient offers no further specific complaints today.   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, 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   CHF (congestive heart failure) (HCC)    followed colon resection, "wouldn't let me get up"   Chronic anticoagulation    Degenerative disc disease, lumbar    Diverticulitis 2018   Diverticulosis    DVT of lower extremity, bilateral (HCC) 2018   Essential hypertension    History of being hospitalized    1 month ago for 3 days, vomiting, and kink in upper intestine   History of hiatal hernia     Mediastinal adenopathy 04/2023   MRSA nasal colonization    Non-small cell lung cancer (HCC) 2022   right   Obesity (BMI 30-39.9)    Osteoarthritis    Osteoporosis    PE (pulmonary thromboembolism) (HCC) 2018   Pneumonia    Small bowel obstruction (HCC) 03/20/2023   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HCC)    Type 2 diabetes mellitus with obesity (HCC)    Vertigo     PAST SURGICAL HISTORY:  Past Surgical History:  Procedure Laterality Date   APPENDECTOMY N/A 02/24/2017   Procedure: Incidental  APPENDECTOMY;  Surgeon: Ricarda Frame, MD;  Location: [name];  Service: General;  Laterality: N/A;   CARPAL TUNNEL RELEASE Bilateral    CATARACT EXTRACTION W/PHACO Left 11/21/2018   Procedure: CATARACT EXTRACTION PHACO AND INTRAOCULAR LENS PLACEMENT (IOC) LEFT DIABETES;  Surgeon: Galen Manila, MD;  Location: [facility];  Service: Ophthalmology;  Laterality: Left;  latex sensitivity Diabetic - oral meds   CATARACT EXTRACTION W/PHACO Right 12/12/2018   Procedure: CATARACT EXTRACTION PHACO AND INTRAOCULAR LENS PLACEMENT (IOC) RIGHT DIABETES;  Surgeon: Galen Manila, MD;  Location: [facility];  Service: Ophthalmology;  Laterality: Right;  Diabetes-oral med Latex sensitiviy   COLON RESECTION SIGMOID N/A 11/02/2016   Procedure: COLON RESECTION SIGMOID;  Surgeon: Ricarda Frame, MD;  Location: [name];  Service: General;  Laterality: N/A;   COLON SURGERY  11/02/2016   COLONOSCOPY WITH PROPOFOL N/A 02/03/2017   Procedure: COLONOSCOPY WITH PROPOFOL;  Surgeon: Toney Reil, MD;  Location: [name];  Service:  Gastroenterology;  Laterality: N/A;   COLOSTOMY N/A 11/02/2016   Procedure: COLOSTOMY;  Surgeon: Ricarda Frame, MD;  Location: [name];  Service: General;  Laterality: N/A;   COLOSTOMY REVERSAL N/A 02/24/2017   Procedure: COLOSTOMY REVERSAL, ostomy takedown, spleenic flexure mobilization, excision rectal stump/distal sigmoid, anastomosis with suture  reinforcement;  Surgeon: Ricarda Frame, MD;  Location: [name];  Service: General;  Laterality: N/A;   ENDOBRONCHIAL ULTRASOUND N/A 05/16/2023   Procedure: ENDOBRONCHIAL ULTRASOUND;  Surgeon: [name], MD;  Location: [name];  Service: Cardiopulmonary;  Laterality: N/A;   FLEXIBLE BRONCHOSCOPY N/A 05/16/2023   Procedure: FLEXIBLE BRONCHOSCOPY;  Surgeon: [name], MD;  Location: [name];  Service: Cardiopulmonary;  Laterality: N/A;   ILEO LOOP DIVERSION N/A 02/24/2017   Procedure: ILEO LOOP COLOSTOMY;  Surgeon: Ricarda Frame, MD;  Location: [name];  Service: General;  Laterality: N/A;   ILEOSTOMY CLOSURE N/A 06/01/2017   Procedure: LOOP ILEOSTOMY TAKEDOWN;  Surgeon: Ricarda Frame, MD;  Location: [name];  Service: General;  Laterality: N/A;   IR IMAGING GUIDED PORT INSERTION  06/14/2023   IVC FILTER INSERTION Right 10/2016   IVC FILTER REMOVAL N/A 08/09/2017   Procedure: IVC FILTER REMOVAL;  Surgeon: Renford Dills, MD;  Location: [name];  Service: Cardiovascular;  Laterality: N/A;   KNEE SURGERY Right    torn menicus   LAPAROSCOPY N/A 02/24/2017   Procedure: LAPAROSCOPY DIAGNOSTIC;  Surgeon: Ricarda Frame, MD;  Location: [name];  Service: General;  Laterality: N/A;   LYSIS OF ADHESION N/A 02/24/2017   Procedure: LYSIS OF ADHESION;  Surgeon: Ricarda Frame, MD;  Location: [name];  Service: General;  Laterality: N/A;   PULMONARY VENOGRAPHY N/A 11/19/2016   Procedure: Pulmonary Venography; IVC filter placement; possible pulmonary thrombectomy;  Surgeon: Renford Dills, MD;  Location: [name];  Service: Cardiovascular;  Laterality: N/A;   SACROPLASTY N/A 11/08/2017   Procedure: SACROPLASTY S2;  Surgeon: Kennedy Bucker, MD;  Location: [name];  Service: Orthopedics;  Laterality: N/A;   SIGMOIDOSCOPY N/A 11/13/2016   Procedure: endoscopic  flexible SIGMOIDOSCOPY;  Surgeon: Lattie Haw, MD;  Location: [name];   Service: General;  Laterality: N/A;   SIGMOIDOSCOPY N/A 05/19/2017   Procedure: Arnell Sieving;  Surgeon: Ricarda Frame, MD;  Location: [name];  Service: General;  Laterality: N/A;   VIDEO BRONCHOSCOPY WITH ENDOBRONCHIAL NAVIGATION N/A 02/23/2021   Procedure: ROBOTIC VIDEO BRONCHOSCOPY WITH ENDOBRONCHIAL NAVIGATION;  Surgeon: [name], MD;  Location: [name];  Service: Pulmonary;  Laterality: N/A;    HEMATOLOGY/ONCOLOGY HISTORY:  Oncology History  Cancer of upper lobe of right lung (HCC)  05/27/2023 Initial Diagnosis   Cancer of upper lobe of right lung (HCC)   07/01/2023 -  Chemotherapy   Patient is on Treatment Plan : LUNG Carboplatin + Paclitaxel + XRT q7d       ALLERGIES:  is allergic to adhesive [tape], morphine and codeine, other, and latex.  MEDICATIONS:  Current Outpatient Medications  Medication Sig Dispense Refill   bisacodyl (DULCOLAX) 10 MG suppository Place 1 suppository (10 mg total) rectally as needed for moderate constipation. 12 suppository 0   calcium carbonate (CALCIUM 600) 600 MG TABS tablet Take 1 tablet (600 mg total) by mouth 2 (two) times daily with a meal. 60 tablet 5   candesartan (ATACAND) 16 MG tablet Take 16 mg by mouth in the morning.     cholecalciferol (VITAMIN D) 25 MCG (1000 UNIT) tablet Take 1,000 Units by mouth in  the morning.     Cyanocobalamin (VITAMIN B-12) 5000 MCG TBDP Take 5,000 mcg by mouth in the morning.     cyclobenzaprine (FLEXERIL) 10 MG tablet Take 10 mg by mouth at bedtime.     dexamethasone (DECADRON) 4 MG tablet Take 2 tablets daily for 2 days, start the day after chemotherapy. Take with food. 30 tablet 1   Dulaglutide 1.5 MG/0.5ML SOPN Inject 1.5 mg into the skin every Saturday.     gabapentin (NEURONTIN) 100 MG capsule Take 200 mg by mouth with breakfast, with lunch, and with evening meal.     gabapentin (NEURONTIN) 300 MG capsule Take 300 mg by mouth at bedtime.     glipiZIDE (GLUCOTROL) 10 MG tablet Take 10  mg by mouth daily before breakfast.     HYDROcodone-acetaminophen (NORCO/VICODIN) 5-325 MG tablet Take 1 tablet by mouth every 6 (six) hours as needed for moderate pain.     Magnesium Oxide -Mg Supplement 500 MG TABS Take 1 tablet by mouth at bedtime.     meclizine (ANTIVERT) 25 MG tablet Take 25 mg by mouth 3 (three) times daily as needed for dizziness.     ondansetron (ZOFRAN) 8 MG tablet Take 1 tablet (8 mg total) by mouth every 8 (eight) hours as needed for nausea or vomiting. Start on the third day after chemotherapy. 30 tablet 1   ONETOUCH ULTRA TEST test strip      oxybutynin (DITROPAN) 5 MG tablet Take 1 tablet by mouth 2 (two) times daily.     polyethylene glycol powder (GLYCOLAX/MIRALAX) 17 GM/SCOOP powder 1 cap full in a full glass of water, two times a day for 5 days. 255 g 0   potassium chloride SA (KLOR-CON M) 20 MEQ tablet Take 1 tablet (20 mEq total) by mouth daily. 10 tablet 0   prochlorperazine (COMPAZINE) 10 MG tablet Take 1 tablet (10 mg total) by mouth every 6 (six) hours as needed for nausea or vomiting. 30 tablet 1   rOPINIRole (REQUIP) 1 MG tablet Take 1 mg by mouth at bedtime.     warfarin (COUMADIN) 6 MG tablet Take 6 mg by mouth at bedtime.     No current facility-administered medications for this visit.    VITAL SIGNS: There were no vitals taken for this visit. There were no vitals filed for this visit.  Estimated body mass index is 34.68 kg/m as calculated from the following:   Height as of 07/06/23: 5\' 3"  (1.6 m).   Weight as of 07/08/23: 195 lb 12.3 oz (88.8 kg).  LABS: CBC:    Component Value Date/Time   WBC 3.7 (L) 07/08/2023 0847   WBC 7.9 07/06/2023 1110   HGB 10.6 (L) 07/08/2023 0847   HGB 13.9 05/30/2013 0520   HCT 33.4 (L) 07/08/2023 0847   HCT 40.8 05/30/2013 0520   PLT 190 07/08/2023 0847   PLT 181 05/30/2013 0520   MCV 85.2 07/08/2023 0847   MCV 92 05/30/2013 0520   NEUTROABS 2.7 07/08/2023 0847   NEUTROABS 4.1 05/30/2013 0520   LYMPHSABS  0.7 07/08/2023 0847   LYMPHSABS 0.9 (L) 05/30/2013 0520   MONOABS 0.3 07/08/2023 0847   MONOABS 0.4 05/30/2013 0520   EOSABS 0.1 07/08/2023 0847   EOSABS 0.0 05/30/2013 0520   BASOSABS 0.0 07/08/2023 0847   BASOSABS 0.0 05/30/2013 0520   Comprehensive Metabolic Panel:    Component Value Date/Time   NA 137 07/08/2023 0846   NA 139 05/30/2013 0520   K 3.3 (L) 07/08/2023 4098  K 3.6 05/30/2013 0520   CL 108 07/08/2023 0846   CL 108 (H) 05/30/2013 0520   CO2 22 07/08/2023 0846   CO2 27 05/30/2013 0520   BUN 13 07/08/2023 0846   BUN 11 05/30/2013 0520   CREATININE 0.61 07/08/2023 0846   CREATININE 0.90 05/30/2013 0520   GLUCOSE 215 (H) 07/08/2023 0846   GLUCOSE 98 05/30/2013 0520   CALCIUM 8.2 (L) 07/08/2023 0846   CALCIUM 7.9 (L) 05/30/2013 0520   AST 15 07/08/2023 0846   ALT 18 07/08/2023 0846   ALT 54 05/29/2013 0631   ALKPHOS 41 07/08/2023 0846   ALKPHOS 64 05/29/2013 0631   BILITOT 0.4 07/08/2023 0846   PROT 6.0 (L) 07/08/2023 0846   PROT 8.2 05/29/2013 0631   ALBUMIN 3.1 (L) 07/08/2023 0846   ALBUMIN 3.8 05/29/2013 0631    RADIOGRAPHIC STUDIES: CT ABDOMEN PELVIS WO CONTRAST Result Date: 07/06/2023 CLINICAL DATA:  Lung cancer.  Abdominal pain. * Tracking Code: BO * EXAM: CT ABDOMEN AND PELVIS WITHOUT CONTRAST TECHNIQUE: Multidetector CT imaging of the abdomen and pelvis was performed following the standard protocol without IV contrast. RADIATION DOSE REDUCTION: This exam was performed according to the departmental dose-optimization program which includes automated exposure control, adjustment of the mA and/or kV according to patient size and/or use of iterative reconstruction technique. COMPARISON:  PET-CT scan 06/03/2023.  Older exams as well. FINDINGS: Lower chest: Parenchymal opacity seen right perihilar with areas of right lung nodularity as seen on the prior examination. The opacities are similar. Please correlate with known history of lung cancer and recent staging  PET-CT. Tiny right pleural effusion is also seen. Breathing motion. Central chest catheter in place. Hepatobiliary: Dependent stones in the gallbladder. Preserved hepatic parenchyma. Pancreas: Unremarkable. No pancreatic ductal dilatation or surrounding inflammatory changes. Spleen: Normal in size without focal abnormality. Adrenals/Urinary Tract: Adrenal glands are unremarkable. Kidneys are normal, without renal calculi, focal lesion, or hydronephrosis. Bladder is unremarkable. Stomach/Bowel: Stomach is nondilated. There is some luminal fluid and debris. The small bowel has a normal course and caliber as well. No dilatation. Large bowel has extensive colonic stool diffusely. The right side of the colon is mildly distended. There is associated midline anterior epigastric hernia involving a small portion of the transverse colon. There is a mild caliber change at the level of the hernia. Opening along the anterior abdominal wall in the axial plane approaches 6.1 cm. Please correlate for reduce ability of the hernia. No pneumatosis, free air. Vascular/Lymphatic: Aortic atherosclerosis. No enlarged abdominal or pelvic lymph nodes. Reproductive: Uterus and bilateral adnexa are unremarkable. Other: No abdominal wall hernia or abnormality. No abdominopelvic ascites. Musculoskeletal: Degenerative changes along the spine, advanced along the lower lumbar region. Trace anterolisthesis of L4-5 and L5-S1. multilevel stenosis. IMPRESSION: Large amount of colonic stool. There is a epigastric midline anterior abdominal wall hernia involving the transverse colon. Slight caliber change at the level of the hernia. No clear signs of true obstruction at this time. No pneumatosis or free air. Please correlate for reducibility of the hernia. Gallstones. Advanced degenerative changes of the spine with listhesis. Masslike opacity seen in the right lung with nodularity similar to the previous examination. Tiny right effusion. Please  correlate with the prior PET-CT scan. Electronically Signed   By: Karen Kays M.D.   On: 07/06/2023 16:19   CT Head Wo Contrast Result Date: 07/06/2023 CLINICAL DATA:  Headache, nausea and vomiting, history of lung cancer EXAM: CT HEAD WITHOUT CONTRAST TECHNIQUE: Contiguous axial images were obtained  from the base of the skull through the vertex without intravenous contrast. RADIATION DOSE REDUCTION: This exam was performed according to the departmental dose-optimization program which includes automated exposure control, adjustment of the mA and/or kV according to patient size and/or use of iterative reconstruction technique. COMPARISON:  06/03/2023 FINDINGS: Brain: No acute infarct or hemorrhage. Stable hypodensities scattered within the periventricular white matter and basal ganglia, compatible with chronic small vessel ischemic changes. Lateral ventricles and remaining midline structures are unremarkable. No acute extra-axial fluid collections. No mass effect. Vascular: Stable atherosclerosis.  No hyperdense vessel. Skull: Normal. Negative for fracture or focal lesion. Sinuses/Orbits: No acute finding. Other: None. IMPRESSION: 1. Stable head CT, no acute intracranial process. Electronically Signed   By: Sharlet Salina M.D.   On: 07/06/2023 16:13   DG PAIN CLINIC C-ARM 1-60 MIN NO REPORT Result Date: 06/21/2023 Fluoro was used, but no Radiologist interpretation will be provided. Please refer to "NOTES" tab for provider progress note.  IR IMAGING GUIDED PORT INSERTION Result Date: 06/14/2023 CLINICAL DATA:  Recurrent progressive lung cancer, access for chemotherapy EXAM: RIGHT INTERNAL JUGULAR SINGLE LUMEN POWER PORT CATHETER INSERTION Date:  06/14/2023 06/14/2023 10:06 am Radiologist:  M. Ruel Favors, MD Guidance:  Ultrasound and fluoroscopic MEDICATIONS: 1% lidocaine local with epinephrine ANESTHESIA/SEDATION: Versed 1.5 mg IV; Fentanyl 75 mcg IV; Moderate Sedation Time:  29 minutes The patient was  continuously monitored during the procedure by the interventional radiology nurse under my direct supervision. FLUOROSCOPY: 0 minutes, 48 seconds (10 mGy) COMPLICATIONS: None immediate. CONTRAST:  None. PROCEDURE: Informed consent was obtained from the patient following explanation of the procedure, risks, benefits and alternatives. The patient understands, agrees and consents for the procedure. All questions were addressed. A time out was performed. Maximal barrier sterile technique utilized including caps, mask, sterile gowns, sterile gloves, large sterile drape, hand hygiene, and 2% chlorhexidine scrub. Under sterile conditions and local anesthesia, right internal jugular micropuncture venous access was performed. Access was performed with ultrasound. Images were obtained for documentation of the patent right internal jugular vein. A guide wire was inserted followed by a transitional dilator. This allowed insertion of a guide wire and catheter into the IVC. Measurements were obtained from the SVC / RA junction back to the right IJ venotomy site. In the right infraclavicular chest, a subcutaneous pocket was created over the second anterior rib. This was done under sterile conditions and local anesthesia. 1% lidocaine with epinephrine was utilized for this. A 2.5 cm incision was made in the skin. Blunt dissection was performed to create a subcutaneous pocket over the right pectoralis major muscle. The pocket was flushed with saline vigorously. There was adequate hemostasis. The port catheter was assembled and checked for leakage. The port catheter was secured in the pocket with two retention sutures. The tubing was tunneled subcutaneously to the right venotomy site and inserted into the SVC/RA junction through a valved peel-away sheath. Position was confirmed with fluoroscopy. Images were obtained for documentation. The patient tolerated the procedure well. No immediate complications. Incisions were closed in a two  layer fashion with 4 - 0 Vicryl suture. Dermabond was applied to the skin. The port catheter was accessed, blood was aspirated followed by saline and heparin flushes. Needle was removed. A dry sterile dressing was applied. IMPRESSION: Ultrasound and fluoroscopically guided right internal jugular single lumen power port catheter insertion. Tip in the SVC/RA junction. Catheter ready for use. Electronically Signed   By: Judie Petit.  Shick M.D.   On: 06/14/2023 10:18  PERFORMANCE STATUS (ECOG) : 1 - Symptomatic but completely ambulatory  Review of Systems Unless otherwise noted, a complete review of systems is negative.  Physical Exam General: NAD Cardiovascular: regular rate and rhythm Pulmonary: clear ant fields Abdomen: soft, nontender, + bowel sounds GU: no suprapubic tenderness Extremities: no edema, no joint deformities Skin: no rashes Neurological: nonfocal  IMPRESSION/PLAN: Breast cancer -on concurrent chemoradiation  Dehydration -borderline low hyponatremia/hypotension, likely reflects volume depletion from poor oral intake.  Offered IV fluids but patient declined.  She says that she is feeling better today and would prefer just to push oral fluids at home.  Nausea -likely secondary to chemotherapy.  Patient says that she is not currently having nausea at time of the clinic visit but did have a recent episode of nausea and vomiting.  Recommend continued oral antiemetics as needed.   Patient return to clinic on 2/21 to see Dr. Smith Robert  Patient expressed understanding and was in agreement with this plan. She also understands that She can call clinic at any time with any questions, concerns, or complaints.   Thank you for allowing me to participate in the care of this very pleasant patient.   Time Total: 15 minutes  Visit consisted of counseling and education dealing with the complex and emotionally intense issues of symptom management in the setting of serious illness.Greater than 50%  of  this time was spent counseling and coordinating care related to the above assessment and plan.  Signed by: Laurette Schimke, PhD, NP-C

## 2023-07-13 ENCOUNTER — Other Ambulatory Visit: Payer: Self-pay

## 2023-07-13 ENCOUNTER — Ambulatory Visit
Admission: RE | Admit: 2023-07-13 | Discharge: 2023-07-13 | Disposition: A | Discharge status: Home / Self Care | Payer: PPO | Source: Ambulatory Visit | Attending: Radiation Oncology | Admitting: Radiation Oncology

## 2023-07-13 DIAGNOSIS — Z510 Encounter for antineoplastic radiation therapy: Secondary | ICD-10-CM | POA: Diagnosis not present

## 2023-07-13 DIAGNOSIS — C3411 Malignant neoplasm of upper lobe, right bronchus or lung: Secondary | ICD-10-CM | POA: Diagnosis not present

## 2023-07-13 LAB — RAD ONC ARIA SESSION SUMMARY
Course Elapsed Days: 15
Plan Fractions Treated to Date: 8
Plan Prescribed Dose Per Fraction: 2 Gy
Plan Total Fractions Prescribed: 35
Plan Total Prescribed Dose: 70 Gy
Reference Point Dosage Given to Date: 16 Gy
Reference Point Session Dosage Given: 2 Gy
Session Number: 8

## 2023-07-14 ENCOUNTER — Ambulatory Visit: Payer: PPO

## 2023-07-14 ENCOUNTER — Telehealth: Payer: Self-pay

## 2023-07-14 NOTE — Telephone Encounter (Signed)
Patient called and states that the reason she missed her last appointment was due to being in the ED.

## 2023-07-15 ENCOUNTER — Inpatient Hospital Stay: Payer: PPO

## 2023-07-15 ENCOUNTER — Inpatient Hospital Stay: Payer: PPO | Admitting: Oncology

## 2023-07-15 ENCOUNTER — Encounter: Payer: Self-pay | Admitting: Oncology

## 2023-07-15 ENCOUNTER — Encounter: Payer: Self-pay | Admitting: *Deleted

## 2023-07-15 ENCOUNTER — Other Ambulatory Visit: Payer: Self-pay

## 2023-07-15 ENCOUNTER — Ambulatory Visit
Admission: RE | Admit: 2023-07-15 | Discharge: 2023-07-15 | Disposition: A | Discharge status: Home / Self Care | Payer: PPO | Source: Ambulatory Visit | Attending: Radiation Oncology | Admitting: Radiation Oncology

## 2023-07-15 VITALS — BP 102/67 | HR 92 | Temp 98.7°F | Resp 19 | Wt 191.0 lb

## 2023-07-15 DIAGNOSIS — Z5111 Encounter for antineoplastic chemotherapy: Secondary | ICD-10-CM | POA: Diagnosis not present

## 2023-07-15 DIAGNOSIS — E876 Hypokalemia: Secondary | ICD-10-CM | POA: Diagnosis not present

## 2023-07-15 DIAGNOSIS — C3411 Malignant neoplasm of upper lobe, right bronchus or lung: Secondary | ICD-10-CM

## 2023-07-15 DIAGNOSIS — K5909 Other constipation: Secondary | ICD-10-CM

## 2023-07-15 DIAGNOSIS — Z510 Encounter for antineoplastic radiation therapy: Secondary | ICD-10-CM | POA: Diagnosis not present

## 2023-07-15 LAB — CBC WITH DIFFERENTIAL (CANCER CENTER ONLY)
Abs Immature Granulocytes: 0.02 K/uL (ref 0.00–0.07)
Basophils Absolute: 0 K/uL (ref 0.0–0.1)
Basophils Relative: 0 %
Eosinophils Absolute: 0 K/uL (ref 0.0–0.5)
Eosinophils Relative: 1 %
HCT: 34.6 % — ABNORMAL LOW (ref 36.0–46.0)
Hemoglobin: 11.2 g/dL — ABNORMAL LOW (ref 12.0–15.0)
Immature Granulocytes: 1 %
Lymphocytes Relative: 18 %
Lymphs Abs: 0.7 K/uL (ref 0.7–4.0)
MCH: 27.4 pg (ref 26.0–34.0)
MCHC: 32.4 g/dL (ref 30.0–36.0)
MCV: 84.6 fL (ref 80.0–100.0)
Monocytes Absolute: 0.4 K/uL (ref 0.1–1.0)
Monocytes Relative: 10 %
Neutro Abs: 2.7 K/uL (ref 1.7–7.7)
Neutrophils Relative %: 70 %
Platelet Count: 193 K/uL (ref 150–400)
RBC: 4.09 MIL/uL (ref 3.87–5.11)
RDW: 15.5 % (ref 11.5–15.5)
WBC Count: 3.9 K/uL — ABNORMAL LOW (ref 4.0–10.5)
nRBC: 0 % (ref 0.0–0.2)

## 2023-07-15 LAB — CMP (CANCER CENTER ONLY)
ALT: 24 U/L (ref 0–44)
AST: 18 U/L (ref 15–41)
Albumin: 3.6 g/dL (ref 3.5–5.0)
Alkaline Phosphatase: 48 U/L (ref 38–126)
Anion gap: 9 (ref 5–15)
BUN: 17 mg/dL (ref 8–23)
CO2: 21 mmol/L — ABNORMAL LOW (ref 22–32)
Calcium: 8.4 mg/dL — ABNORMAL LOW (ref 8.9–10.3)
Chloride: 107 mmol/L (ref 98–111)
Creatinine: 0.7 mg/dL (ref 0.44–1.00)
GFR, Estimated: >60 mL/min (ref >60)
Glucose, Bld: 148 mg/dL — ABNORMAL HIGH (ref 70–99)
Potassium: 3.4 mmol/L — ABNORMAL LOW (ref 3.5–5.1)
Sodium: 137 mmol/L (ref 135–145)
Total Bilirubin: 0.4 mg/dL (ref 0.0–1.2)
Total Protein: 6.5 g/dL (ref 6.5–8.1)

## 2023-07-15 LAB — RAD ONC ARIA SESSION SUMMARY
Course Elapsed Days: 17
Plan Fractions Treated to Date: 9
Plan Prescribed Dose Per Fraction: 2 Gy
Plan Total Fractions Prescribed: 35
Plan Total Prescribed Dose: 70 Gy
Reference Point Dosage Given to Date: 18 Gy
Reference Point Session Dosage Given: 2 Gy
Session Number: 9

## 2023-07-15 MED ORDER — FAMOTIDINE IN NACL 20-0.9 MG/50ML-% IV SOLN
20.0000 mg | Freq: Once | INTRAVENOUS | Status: AC
  Administered 2023-07-15: 20 mg via INTRAVENOUS
  Filled 2023-07-15: qty 50

## 2023-07-15 MED ORDER — DEXAMETHASONE SODIUM PHOSPHATE 10 MG/ML IJ SOLN
10.0000 mg | Freq: Once | INTRAMUSCULAR | Status: AC
  Administered 2023-07-15: 10 mg via INTRAVENOUS
  Filled 2023-07-15: qty 1

## 2023-07-15 MED ORDER — SODIUM CHLORIDE 0.9 % IV SOLN
INTRAVENOUS | Status: DC
  Filled 2023-07-15 (×2): qty 250

## 2023-07-15 MED ORDER — PALONOSETRON HCL INJECTION 0.25 MG/5ML
0.2500 mg | Freq: Once | INTRAVENOUS | Status: AC
  Administered 2023-07-15: 0.25 mg via INTRAVENOUS
  Filled 2023-07-15: qty 5

## 2023-07-15 MED ORDER — SODIUM CHLORIDE 0.9 % IV SOLN
45.0000 mg/m2 | Freq: Once | INTRAVENOUS | Status: AC
  Administered 2023-07-15: 90 mg via INTRAVENOUS
  Filled 2023-07-15: qty 15

## 2023-07-15 MED ORDER — SODIUM CHLORIDE 0.9 % IV SOLN
202.2000 mg | Freq: Once | INTRAVENOUS | Status: AC
  Administered 2023-07-15: 200 mg via INTRAVENOUS
  Filled 2023-07-15: qty 20

## 2023-07-15 MED ORDER — HEPARIN SOD (PORK) LOCK FLUSH 100 UNIT/ML IV SOLN
500.0000 [IU] | Freq: Once | INTRAVENOUS | Status: AC | PRN
Start: 2023-07-15 — End: 2023-07-15
  Administered 2023-07-15: 500 [IU]
  Filled 2023-07-15: qty 5

## 2023-07-15 MED ORDER — DIPHENHYDRAMINE HCL 50 MG/ML IJ SOLN
50.0000 mg | Freq: Once | INTRAMUSCULAR | Status: AC
  Administered 2023-07-15: 50 mg via INTRAVENOUS
  Filled 2023-07-15: qty 1

## 2023-07-15 NOTE — Patient Instructions (Signed)
 [facility] - A DEPT OF [facility]  Discharge Instructions: Thank you for choosing [facility] to provide your oncology and hematology care.  If you have a lab appointment with the Cancer Center, please go directly to the Cancer Center and check in at the registration area.  Wear comfortable clothing and clothing appropriate for easy access to any Portacath or PICC line.   We strive to give you quality time with your provider. You may need to reschedule your appointment if you arrive late (15 or more minutes).  Arriving late affects you and other patients whose appointments are after yours.  Also, if you miss three or more appointments without notifying the office, you may be dismissed from the clinic at the provider's discretion.      For prescription refill requests, have your pharmacy contact our office and allow 72 hours for refills to be completed.    Today you received the following chemotherapy and/or immunotherapy agents TAXOL and CARBOPLATIN       To help prevent nausea and vomiting after your treatment, we encourage you to take your nausea medication as directed.  BELOW ARE SYMPTOMS THAT SHOULD BE REPORTED IMMEDIATELY: *FEVER GREATER THAN 100.4 F (38 C) OR HIGHER *CHILLS OR SWEATING *NAUSEA AND VOMITING THAT IS NOT CONTROLLED WITH YOUR NAUSEA MEDICATION *UNUSUAL SHORTNESS OF BREATH *UNUSUAL BRUISING OR BLEEDING *URINARY PROBLEMS (pain or burning when urinating, or frequent urination) *BOWEL PROBLEMS (unusual diarrhea, constipation, pain near the anus) TENDERNESS IN MOUTH AND THROAT WITH OR WITHOUT PRESENCE OF ULCERS (sore throat, sores in mouth, or a toothache) UNUSUAL RASH, SWELLING OR PAIN  UNUSUAL VAGINAL DISCHARGE OR ITCHING   Items with * indicate a potential emergency and should be followed up as soon as possible or go to the Emergency Department if any problems should occur.  Please show the CHEMOTHERAPY ALERT CARD or  IMMUNOTHERAPY ALERT CARD at check-in to the Emergency Department and triage nurse.  Should you have questions after your visit or need to cancel or reschedule your appointment, please contact [facility] - A DEPT OF [facility]  [phone] and follow the prompts.  Office hours are 8:00 a.m. to 4:30 p.m. Monday - Friday. Please note that voicemails left after 4:00 p.m. may not be returned until the following business day.  We are closed weekends and major holidays. You have access to a nurse at all times for urgent questions. Please call the main number to the clinic [phone] and follow the prompts.  For any non-urgent questions, you may also contact your provider using MyChart. We now offer e-Visits for anyone 83 and older to request care online for non-urgent symptoms. For details visit [email or web address].   Also download the MyChart app! Go to the app store, search "MyChart", open the app, select [facility], and log in with your MyChart username and password.  Paclitaxel Injection What is this medication? PACLITAXEL (PAK li TAX el) treats some types of cancer. It works by slowing down the growth of cancer cells. This medicine may be used for other purposes; ask your health care provider or pharmacist if you have questions. COMMON BRAND NAME(S): Onxol, Taxol What should I tell my care team before I take this medication? They need to know if you have any of these conditions: Heart disease Liver disease Low white blood cell levels An unusual or allergic reaction to paclitaxel, other medications, foods, dyes, or preservatives  If you or your partner are pregnant or trying to get pregnant Breast-feeding How should I use this medication? This medication is injected into a vein. It is given by your care team in a hospital or clinic setting. Talk to your care team about the use of this medication in children. While it may be given to children for selected  conditions, precautions do apply. Overdosage: If you think you have taken too much of this medicine contact a poison control center or emergency room at once. NOTE: This medicine is only for you. Do not share this medicine with others. What if I miss a dose? Keep appointments for follow-up doses. It is important not to miss your dose. Call your care team if you are unable to keep an appointment. What may interact with this medication? Do not take this medication with any of the following: Live virus vaccines Other medications may affect the way this medication works. Talk with your care team about all of the medications you take. They may suggest changes to your treatment plan to lower the risk of side effects and to make sure your medications work as intended. This list may not describe all possible interactions. Give your health care provider a list of all the medicines, herbs, non-prescription drugs, or dietary supplements you use. Also tell them if you smoke, drink alcohol, or use illegal drugs. Some items may interact with your medicine. What should I watch for while using this medication? Your condition will be monitored carefully while you are receiving this medication. You may need blood work while taking this medication. This medication may make you feel generally unwell. This is not uncommon as chemotherapy can affect healthy cells as well as cancer cells. Report any side effects. Continue your course of treatment even though you feel ill unless your care team tells you to stop. This medication can cause serious allergic reactions. To reduce the risk, your care team may give you other medications to take before receiving this one. Be sure to follow the directions from your care team. This medication may increase your risk of getting an infection. Call your care team for advice if you get a fever, chills, sore throat, or other symptoms of a cold or flu. Do not treat yourself. Try to avoid  being around people who are sick. This medication may increase your risk to bruise or bleed. Call your care team if you notice any unusual bleeding. Be careful brushing or flossing your teeth or using a toothpick because you may get an infection or bleed more easily. If you have any dental work done, tell your dentist you are receiving this medication. Talk to your care team if you may be pregnant. Serious birth defects can occur if you take this medication during pregnancy. Talk to your care team before breastfeeding. Changes to your treatment plan may be needed. What side effects may I notice from receiving this medication? Side effects that you should report to your care team as soon as possible: Allergic reactions--skin rash, itching, hives, swelling of the face, lips, tongue, or throat Heart rhythm changes--fast or irregular heartbeat, dizziness, feeling faint or lightheaded, chest pain, trouble breathing Increase in blood pressure Infection--fever, chills, cough, sore throat, wounds that don't heal, pain or trouble when passing urine, general feeling of discomfort or being unwell Low blood pressure--dizziness, feeling faint or lightheaded, blurry vision Low red blood cell level--unusual weakness or fatigue, dizziness, headache, trouble breathing Painful swelling, warmth, or redness of the skin, blisters  or sores at the infusion site Pain, tingling, or numbness in the hands or feet Slow heartbeat--dizziness, feeling faint or lightheaded, confusion, trouble breathing, unusual weakness or fatigue Unusual bruising or bleeding Side effects that usually do not require medical attention (report to your care team if they continue or are bothersome): Diarrhea Hair loss Joint pain Loss of appetite Muscle pain Nausea Vomiting This list may not describe all possible side effects. Call your doctor for medical advice about side effects. You may report side effects to FDA at 1-800-FDA-1088. Where  should I keep my medication? This medication is given in a hospital or clinic. It will not be stored at home. NOTE: This sheet is a summary. It may not cover all possible information. If you have questions about this medicine, talk to your doctor, pharmacist, or health care provider.  2024 Elsevier/Gold Standard (2021-09-29 00:00:00)  Carboplatin Injection What is this medication? CARBOPLATIN (KAR boe pla tin) treats some types of cancer. It works by slowing down the growth of cancer cells. This medicine may be used for other purposes; ask your health care provider or pharmacist if you have questions. COMMON BRAND NAME(S): Paraplatin What should I tell my care team before I take this medication? They need to know if you have any of these conditions: Blood disorders Hearing problems Kidney disease Recent or ongoing radiation therapy An unusual or allergic reaction to carboplatin, cisplatin, other medications, foods, dyes, or preservatives Pregnant or trying to get pregnant Breast-feeding How should I use this medication? This medication is injected into a vein. It is given by your care team in a hospital or clinic setting. Talk to your care team about the use of this medication in children. Special care may be needed. Overdosage: If you think you have taken too much of this medicine contact a poison control center or emergency room at once. NOTE: This medicine is only for you. Do not share this medicine with others. What if I miss a dose? Keep appointments for follow-up doses. It is important not to miss your dose. Call your care team if you are unable to keep an appointment. What may interact with this medication? Medications for seizures Some antibiotics, such as amikacin, gentamicin, neomycin, streptomycin, tobramycin Vaccines This list may not describe all possible interactions. Give your health care provider a list of all the medicines, herbs, non-prescription drugs, or dietary  supplements you use. Also tell them if you smoke, drink alcohol, or use illegal drugs. Some items may interact with your medicine. What should I watch for while using this medication? Your condition will be monitored carefully while you are receiving this medication. You may need blood work while taking this medication. This medication may make you feel generally unwell. This is not uncommon, as chemotherapy can affect healthy cells as well as cancer cells. Report any side effects. Continue your course of treatment even though you feel ill unless your care team tells you to stop. In some cases, you may be given additional medications to help with side effects. Follow all directions for their use. This medication may increase your risk of getting an infection. Call your care team for advice if you get a fever, chills, sore throat, or other symptoms of a cold or flu. Do not treat yourself. Try to avoid being around people who are sick. Avoid taking medications that contain aspirin, acetaminophen, ibuprofen, naproxen, or ketoprofen unless instructed by your care team. These medications may hide a fever. Be careful brushing or flossing your  teeth or using a toothpick because you may get an infection or bleed more easily. If you have any dental work done, tell your dentist you are receiving this medication. Talk to your care team if you wish to become pregnant or think you might be pregnant. This medication can cause serious birth defects. Talk to your care team about effective forms of contraception. Do not breast-feed while taking this medication. What side effects may I notice from receiving this medication? Side effects that you should report to your care team as soon as possible: Allergic reactions--skin rash, itching, hives, swelling of the face, lips, tongue, or throat Infection--fever, chills, cough, sore throat, wounds that don't heal, pain or trouble when passing urine, general feeling of  discomfort or being unwell Low red blood cell level--unusual weakness or fatigue, dizziness, headache, trouble breathing Pain, tingling, or numbness in the hands or feet, muscle weakness, change in vision, confusion or trouble speaking, loss of balance or coordination, trouble walking, seizures Unusual bruising or bleeding Side effects that usually do not require medical attention (report to your care team if they continue or are bothersome): Hair loss Nausea Unusual weakness or fatigue Vomiting This list may not describe all possible side effects. Call your doctor for medical advice about side effects. You may report side effects to FDA at 1-800-FDA-1088. Where should I keep my medication? This medication is given in a hospital or clinic. It will not be stored at home. NOTE: This sheet is a summary. It may not cover all possible information. If you have questions about this medicine, talk to your doctor, pharmacist, or health care provider.  2024 Elsevier/Gold Standard (2021-09-01 00:00:00)

## 2023-07-15 NOTE — Progress Notes (Signed)
Hematology/Oncology Consult note [facility]  [phone] [phone]  Patient Care Team: Barbette Reichmann, MD as PCP - General (Internal Medicine) Glory Buff, RN as Oncology Nurse Navigator Creig Hines, MD as Consulting Physician (Oncology)   Name of the patient: Kara [name]  [id number]  February 29, 1951   Date of visit: 07/15/23  Diagnosis- right upper lobe lung cancer s/p SBRT now with mediastinal recurrence   Chief complaint/ Reason for visit-on treatment assessment prior to cycle 3 of weekly CarboTaxol chemotherapy  Heme/Onc history: Patient is a 73 year old female who underwent SBRT for presumed right upper lobe lung cancer in December 2022.  She was in remission until November 2024 when she was noted to have hypermetabolic in the right hilum as well as mediastinal region.  15 x 12 mm nodular opacity in the dome of the right diaphragm.  10 mm left cervical lymph node.  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  Right pleural thoracentesis negative for malignancy.  Plan is for concurrent chemoradiation with weekly CarboTaxol.    Interval history-patient had significant nausea vomiting 2 days after chemotherapy but she did not use prophylactic Decadron with cycle 1.  She also reports constipation which has improved after trying over-the-counter stool softeners.  Overall feels well today.  ECOG PS- 1 Pain scale- 0 Opioid associated constipation- no  Review of systems- Review of Systems  Constitutional:  Positive for malaise/fatigue.      Allergies  Allergen Reactions   Adhesive [Tape] Other (See Comments)    Pt reports, when removing tape from skin most tapes pull her skin off. Ok to use paper tape   Morphine And Codeine Nausea And Vomiting   Other Hives    Chlorhexadine wipes/CHG wipes,    Latex Rash    Exam gloves/ does not react around elastic and lips don't swell when blowing balloons      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, 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   CHF (congestive heart failure) (HCC)    followed colon resection, "wouldn't let me get up"   Chronic anticoagulation    Degenerative disc disease, lumbar    Diverticulitis 2018   Diverticulosis    DVT of lower extremity, bilateral (HCC) 2018   Essential hypertension    History of being hospitalized    1 month ago for 3 days, vomiting, and kink in upper intestine   History of hiatal hernia    Mediastinal adenopathy 04/2023   MRSA nasal colonization    Non-small cell lung cancer (HCC) 2022   right   Obesity (BMI 30-39.9)    Osteoarthritis    Osteoporosis    PE (pulmonary thromboembolism) (HCC) 2018   Pneumonia    Small bowel obstruction (HCC) 03/20/2023   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HCC)    Type 2 diabetes mellitus with obesity (HCC)    Vertigo      Past Surgical History:  Procedure Laterality Date   APPENDECTOMY N/A 02/24/2017   Procedure: Incidental  APPENDECTOMY;  Surgeon: Ricarda Frame, MD;  Location: [name];  Service: General;  Laterality: N/A;   CARPAL TUNNEL RELEASE Bilateral    CATARACT EXTRACTION W/PHACO Left 11/21/2018   Procedure: CATARACT EXTRACTION PHACO AND INTRAOCULAR LENS PLACEMENT (IOC) LEFT DIABETES;  Surgeon: Galen Manila, MD;  Location: [facility];  Service: Ophthalmology;  Laterality: Left;  latex sensitivity Diabetic -  oral meds   CATARACT EXTRACTION W/PHACO Right 12/12/2018   Procedure: CATARACT EXTRACTION PHACO AND INTRAOCULAR LENS PLACEMENT (IOC) RIGHT DIABETES;  Surgeon: Galen Manila, MD;  Location: [facility];  Service: Ophthalmology;  Laterality: Right;  Diabetes-oral med Latex sensitiviy   COLON RESECTION SIGMOID N/A 11/02/2016   Procedure: COLON RESECTION SIGMOID;  Surgeon: Ricarda Frame, MD;  Location: [name];  Service: General;  Laterality: N/A;   COLON  SURGERY  11/02/2016   COLONOSCOPY WITH PROPOFOL N/A 02/03/2017   Procedure: COLONOSCOPY WITH PROPOFOL;  Surgeon: Toney Reil, MD;  Location: [name];  Service: Gastroenterology;  Laterality: N/A;   COLOSTOMY N/A 11/02/2016   Procedure: COLOSTOMY;  Surgeon: Ricarda Frame, MD;  Location: [name];  Service: General;  Laterality: N/A;   COLOSTOMY REVERSAL N/A 02/24/2017   Procedure: COLOSTOMY REVERSAL, ostomy takedown, spleenic flexure mobilization, excision rectal stump/distal sigmoid, anastomosis with suture reinforcement;  Surgeon: Ricarda Frame, MD;  Location: [name];  Service: General;  Laterality: N/A;   ENDOBRONCHIAL ULTRASOUND N/A 05/16/2023   Procedure: ENDOBRONCHIAL ULTRASOUND;  Surgeon: [name], MD;  Location: [name];  Service: Cardiopulmonary;  Laterality: N/A;   FLEXIBLE BRONCHOSCOPY N/A 05/16/2023   Procedure: FLEXIBLE BRONCHOSCOPY;  Surgeon: [name], MD;  Location: [name];  Service: Cardiopulmonary;  Laterality: N/A;   ILEO LOOP DIVERSION N/A 02/24/2017   Procedure: ILEO LOOP COLOSTOMY;  Surgeon: Ricarda Frame, MD;  Location: [name];  Service: General;  Laterality: N/A;   ILEOSTOMY CLOSURE N/A 06/01/2017   Procedure: LOOP ILEOSTOMY TAKEDOWN;  Surgeon: Ricarda Frame, MD;  Location: [name];  Service: General;  Laterality: N/A;   IR IMAGING GUIDED PORT INSERTION  06/14/2023   IVC FILTER INSERTION Right 10/2016   IVC FILTER REMOVAL N/A 08/09/2017   Procedure: IVC FILTER REMOVAL;  Surgeon: Renford Dills, MD;  Location: [name];  Service: Cardiovascular;  Laterality: N/A;   KNEE SURGERY Right    torn menicus   LAPAROSCOPY N/A 02/24/2017   Procedure: LAPAROSCOPY DIAGNOSTIC;  Surgeon: Ricarda Frame, MD;  Location: [name];  Service: General;  Laterality: N/A;   LYSIS OF ADHESION N/A 02/24/2017   Procedure: LYSIS OF ADHESION;  Surgeon: Ricarda Frame, MD;  Location: [name];  Service: General;  Laterality:  N/A;   PULMONARY VENOGRAPHY N/A 11/19/2016   Procedure: Pulmonary Venography; IVC filter placement; possible pulmonary thrombectomy;  Surgeon: Renford Dills, MD;  Location: [name];  Service: Cardiovascular;  Laterality: N/A;   SACROPLASTY N/A 11/08/2017   Procedure: SACROPLASTY S2;  Surgeon: Kennedy Bucker, MD;  Location: [name];  Service: Orthopedics;  Laterality: N/A;   SIGMOIDOSCOPY N/A 11/13/2016   Procedure: endoscopic  flexible SIGMOIDOSCOPY;  Surgeon: Lattie Haw, MD;  Location: [name];  Service: General;  Laterality: N/A;   SIGMOIDOSCOPY N/A 05/19/2017   Procedure: Arnell Sieving;  Surgeon: Ricarda Frame, MD;  Location: [name];  Service: General;  Laterality: N/A;   VIDEO BRONCHOSCOPY WITH ENDOBRONCHIAL NAVIGATION N/A 02/23/2021   Procedure: ROBOTIC VIDEO BRONCHOSCOPY WITH ENDOBRONCHIAL NAVIGATION;  Surgeon: [name], MD;  Location: [name];  Service: Pulmonary;  Laterality: N/A;    Social History   Socioeconomic History   Marital status: Married    Spouse name: Broadus John   Number of children: 1   Years of education: Not on file   Highest education level: Not on file  Occupational History   Not on file  Tobacco Use   Smoking status: Never    Passive exposure:  Current (husband smokes)   Smokeless tobacco: Never  Vaping Use   Vaping status: Never Used  Substance and Sexual Activity   Alcohol use: No    Alcohol/week: 0.0 standard drinks of alcohol   Drug use: No   Sexual activity: Never  Other Topics Concern   Not on file  Social History Narrative   Not on file   Social Drivers of Health   Financial Resource Strain: Low Risk  (05/19/2023)   Received from [facility]   Overall Financial Resource Strain (CARDIA)    Difficulty of Paying Living Expenses: Not hard at all  Food Insecurity: No Food Insecurity (05/19/2023)   Received from [facility]   Hunger Vital Sign    Worried About  Running Out of Food in the Last Year: Never true    Ran Out of Food in the Last Year: Never true  Transportation Needs: No Transportation Needs (05/19/2023)   Received from [facility] - Transportation    In the past 12 months, has lack of transportation kept you from medical appointments or from getting medications?: No    Lack of Transportation (Non-Medical): No  Physical Activity: Not on file  Stress: Not on file  Social Connections: Not on file  Intimate Partner Violence: Not At Risk (05/09/2023)   Humiliation, Afraid, Rape, and Kick questionnaire    Fear of Current or Ex-Partner: No    Emotionally Abused: No    Physically Abused: No    Sexually Abused: No    Family History  Problem Relation Age of Onset   Diabetes Mother    Heart disease Mother    Cancer Mother    Breast cancer Mother        >50   Heart disease Father    Diabetes Sister    Heart disease Sister      Current Outpatient Medications:    bisacodyl (DULCOLAX) 10 MG suppository, Place 1 suppository (10 mg total) rectally as needed for moderate constipation., Disp: 12 suppository, Rfl: 0   calcium carbonate (CALCIUM 600) 600 MG TABS tablet, Take 1 tablet (600 mg total) by mouth 2 (two) times daily with a meal., Disp: 60 tablet, Rfl: 5   candesartan (ATACAND) 16 MG tablet, Take 16 mg by mouth in the morning., Disp: , Rfl:    cholecalciferol (VITAMIN D) 25 MCG (1000 UNIT) tablet, Take 1,000 Units by mouth in the morning., Disp: , Rfl:    Cyanocobalamin (VITAMIN B-12) 5000 MCG TBDP, Take 5,000 mcg by mouth in the morning., Disp: , Rfl:    cyclobenzaprine (FLEXERIL) 10 MG tablet, Take 10 mg by mouth at bedtime., Disp: , Rfl:    dexamethasone (DECADRON) 4 MG tablet, Take 2 tablets daily for 2 days, start the day after chemotherapy. Take with food., Disp: 30 tablet, Rfl: 1   Dulaglutide 1.5 MG/0.5ML SOPN, Inject 1.5 mg into the skin every Saturday., Disp: , Rfl:    gabapentin (NEURONTIN) 100  MG capsule, Take 200 mg by mouth with breakfast, with lunch, and with evening meal., Disp: , Rfl:    gabapentin (NEURONTIN) 300 MG capsule, Take 300 mg by mouth at bedtime., Disp: , Rfl:    glipiZIDE (GLUCOTROL) 10 MG tablet, Take 10 mg by mouth daily before breakfast., Disp: , Rfl:    HYDROcodone-acetaminophen (NORCO/VICODIN) 5-325 MG tablet, Take 1 tablet by mouth every 6 (six) hours as needed for moderate pain., Disp: , Rfl:    Magnesium Oxide -Mg  Supplement 500 MG TABS, Take 1 tablet by mouth at bedtime., Disp: , Rfl:    meclizine (ANTIVERT) 25 MG tablet, Take 25 mg by mouth 3 (three) times daily as needed for dizziness., Disp: , Rfl:    ondansetron (ZOFRAN) 8 MG tablet, Take 1 tablet (8 mg total) by mouth every 8 (eight) hours as needed for nausea or vomiting. Start on the third day after chemotherapy., Disp: 30 tablet, Rfl: 1   ONETOUCH ULTRA TEST test strip, , Disp: , Rfl:    oxybutynin (DITROPAN) 5 MG tablet, Take 1 tablet by mouth 2 (two) times daily., Disp: , Rfl:    polyethylene glycol powder (GLYCOLAX/MIRALAX) 17 GM/SCOOP powder, 1 cap full in a full glass of water, two times a day for 5 days., Disp: 255 g, Rfl: 0   potassium chloride SA (KLOR-CON M) 20 MEQ tablet, Take 1 tablet (20 mEq total) by mouth daily., Disp: 10 tablet, Rfl: 0   prochlorperazine (COMPAZINE) 10 MG tablet, Take 1 tablet (10 mg total) by mouth every 6 (six) hours as needed for nausea or vomiting., Disp: 30 tablet, Rfl: 1   rOPINIRole (REQUIP) 1 MG tablet, Take 1 mg by mouth at bedtime., Disp: , Rfl:    warfarin (COUMADIN) 6 MG tablet, Take 6 mg by mouth at bedtime., Disp: , Rfl:  No current facility-administered medications for this visit.  Facility-Administered Medications Ordered in Other Visits:    0.9 %  sodium chloride infusion, , Intravenous, Continuous, Creig Hines, MD, Last Rate: 10 mL/hr at 07/15/23 1012, New Bag at 07/15/23 1012   CARBOplatin (PARAPLATIN) 200 mg in sodium chloride 0.9 % 100 mL chemo  infusion, 200 mg, Intravenous, Once, Creig Hines, MD   famotidine (PEPCID) IVPB 20 mg premix, 20 mg, Intravenous, Once, Creig Hines, MD, Last Rate: 200 mL/hr at 07/15/23 1017, 20 mg at 07/15/23 1017   PACLitaxel (TAXOL) 90 mg in sodium chloride 0.9 % 250 mL chemo infusion (</= 80mg /m2), 45 mg/m2 (Treatment Plan Recorded), Intravenous, Once, Creig Hines, MD  Physical exam:  Vitals:   07/15/23 0920  BP: 102/67  Pulse: 92  Resp: 19  Temp: 98.7 F (37.1 C)  TempSrc: Tympanic  SpO2: 98%  Weight: 191 lb (86.6 kg)   Physical Exam Cardiovascular:     Rate and Rhythm: Normal rate and regular rhythm.     Heart sounds: Normal heart sounds.  Pulmonary:     Effort: Pulmonary effort is normal.     Breath sounds: Normal breath sounds.  Skin:    General: Skin is warm and dry.  Neurological:     Mental Status: She is alert and oriented to person, place, and time.         Latest Ref Rng & Units 07/15/2023    8:58 AM  CMP  Glucose 70 - 99 mg/dL 161   BUN 8 - 23 mg/dL 17   Creatinine 0.96 - 1.00 mg/dL 0.45   Sodium 409 - 811 mmol/L 137   Potassium 3.5 - 5.1 mmol/L 3.4   Chloride 98 - 111 mmol/L 107   CO2 22 - 32 mmol/L 21   Calcium 8.9 - 10.3 mg/dL 8.4   Total Protein 6.5 - 8.1 g/dL 6.5   Total Bilirubin 0.0 - 1.2 mg/dL 0.4   Alkaline Phos 38 - 126 U/L 48   AST 15 - 41 U/L 18   ALT 0 - 44 U/L 24       Latest Ref Rng & Units 07/15/2023  8:58 AM  CBC  WBC 4.0 - 10.5 K/uL 3.9   Hemoglobin 12.0 - 15.0 g/dL 40.9   Hematocrit 81.1 - 46.0 % 34.6   Platelets 150 - 400 K/uL 193     No images are attached to the encounter.  CT ABDOMEN PELVIS WO CONTRAST Result Date: 07/06/2023 CLINICAL DATA:  Lung cancer.  Abdominal pain. * Tracking Code: BO * EXAM: CT ABDOMEN AND PELVIS WITHOUT CONTRAST TECHNIQUE: Multidetector CT imaging of the abdomen and pelvis was performed following the standard protocol without IV contrast. RADIATION DOSE REDUCTION: This exam was performed according to  the departmental dose-optimization program which includes automated exposure control, adjustment of the mA and/or kV according to patient size and/or use of iterative reconstruction technique. COMPARISON:  PET-CT scan 06/03/2023.  Older exams as well. FINDINGS: Lower chest: Parenchymal opacity seen right perihilar with areas of right lung nodularity as seen on the prior examination. The opacities are similar. Please correlate with known history of lung cancer and recent staging PET-CT. Tiny right pleural effusion is also seen. Breathing motion. Central chest catheter in place. Hepatobiliary: Dependent stones in the gallbladder. Preserved hepatic parenchyma. Pancreas: Unremarkable. No pancreatic ductal dilatation or surrounding inflammatory changes. Spleen: Normal in size without focal abnormality. Adrenals/Urinary Tract: Adrenal glands are unremarkable. Kidneys are normal, without renal calculi, focal lesion, or hydronephrosis. Bladder is unremarkable. Stomach/Bowel: Stomach is nondilated. There is some luminal fluid and debris. The small bowel has a normal course and caliber as well. No dilatation. Large bowel has extensive colonic stool diffusely. The right side of the colon is mildly distended. There is associated midline anterior epigastric hernia involving a small portion of the transverse colon. There is a mild caliber change at the level of the hernia. Opening along the anterior abdominal wall in the axial plane approaches 6.1 cm. Please correlate for reduce ability of the hernia. No pneumatosis, free air. Vascular/Lymphatic: Aortic atherosclerosis. No enlarged abdominal or pelvic lymph nodes. Reproductive: Uterus and bilateral adnexa are unremarkable. Other: No abdominal wall hernia or abnormality. No abdominopelvic ascites. Musculoskeletal: Degenerative changes along the spine, advanced along the lower lumbar region. Trace anterolisthesis of L4-5 and L5-S1. multilevel stenosis. IMPRESSION: Large amount of  colonic stool. There is a epigastric midline anterior abdominal wall hernia involving the transverse colon. Slight caliber change at the level of the hernia. No clear signs of true obstruction at this time. No pneumatosis or free air. Please correlate for reducibility of the hernia. Gallstones. Advanced degenerative changes of the spine with listhesis. Masslike opacity seen in the right lung with nodularity similar to the previous examination. Tiny right effusion. Please correlate with the prior PET-CT scan. Electronically Signed   By: Karen Kays M.D.   On: 07/06/2023 16:19   CT Head Wo Contrast Result Date: 07/06/2023 CLINICAL DATA:  Headache, nausea and vomiting, history of lung cancer EXAM: CT HEAD WITHOUT CONTRAST TECHNIQUE: Contiguous axial images were obtained from the base of the skull through the vertex without intravenous contrast. RADIATION DOSE REDUCTION: This exam was performed according to the departmental dose-optimization program which includes automated exposure control, adjustment of the mA and/or kV according to patient size and/or use of iterative reconstruction technique. COMPARISON:  06/03/2023 FINDINGS: Brain: No acute infarct or hemorrhage. Stable hypodensities scattered within the periventricular white matter and basal ganglia, compatible with chronic small vessel ischemic changes. Lateral ventricles and remaining midline structures are unremarkable. No acute extra-axial fluid collections. No mass effect. Vascular: Stable atherosclerosis.  No hyperdense vessel. Skull: Normal. Negative  for fracture or focal lesion. Sinuses/Orbits: No acute finding. Other: None. IMPRESSION: 1. Stable head CT, no acute intracranial process. Electronically Signed   By: Sharlet Salina M.D.   On: 07/06/2023 16:13   DG PAIN CLINIC C-ARM 1-60 MIN NO REPORT Result Date: 06/21/2023 Fluoro was used, but no Radiologist interpretation will be provided. Please refer to "NOTES" tab for provider progress  note.    Assessment and plan- Patient is a 73 y.o. adult with history of stage I right upper lobe lung cancer s/p SBRT in 2022 now with biopsy-proven mediastinal recurrence consistent with adenocarcinoma.  She is here for on treatment assessment prior to cycle 3 of weekly CarboTaxol chemotherapy  Counts okay to proceed with cycle 3 of CarboTaxol chemotherapy today.  She will directly proceed for cycle 4 next week and I will see her back in 2 weeks for cycle 5.  Chemo induced nausea: She will take Decadron for 2 days starting tomorrow as prophylaxis and she also has as needed Compazine and Zofran.  She still has persistent nausea next week she will let us know if she wants to come for IV nausea medicines or IV fluids.  Mild hypokalemia continue to monitor.  Chemo induced constipation: Over-the-counter constipation medications have been working well for her and she will continue with that   Visit Diagnosis 1. Cancer of upper lobe of right lung (HCC)   2. Encounter for antineoplastic chemotherapy   3. Hypokalemia   4. Other constipation      Dr. Owens Shark, MD, MPH [facility] [phone] 07/15/2023 10:26 AM

## 2023-07-18 ENCOUNTER — Other Ambulatory Visit: Payer: Self-pay

## 2023-07-18 ENCOUNTER — Ambulatory Visit
Admission: RE | Admit: 2023-07-18 | Discharge: 2023-07-18 | Disposition: A | Discharge status: Home / Self Care | Payer: PPO | Source: Ambulatory Visit | Attending: Radiation Oncology | Admitting: Radiation Oncology

## 2023-07-18 DIAGNOSIS — C3411 Malignant neoplasm of upper lobe, right bronchus or lung: Secondary | ICD-10-CM | POA: Diagnosis not present

## 2023-07-18 DIAGNOSIS — Z510 Encounter for antineoplastic radiation therapy: Secondary | ICD-10-CM | POA: Diagnosis not present

## 2023-07-18 LAB — RAD ONC ARIA SESSION SUMMARY
Course Elapsed Days: 20
Plan Fractions Treated to Date: 10
Plan Prescribed Dose Per Fraction: 2 Gy
Plan Total Fractions Prescribed: 35
Plan Total Prescribed Dose: 70 Gy
Reference Point Dosage Given to Date: 20 Gy
Reference Point Session Dosage Given: 2 Gy
Session Number: 10

## 2023-07-18 NOTE — Progress Notes (Signed)
 Viral symptoms concerning for COVID/influenza

## 2023-07-19 ENCOUNTER — Other Ambulatory Visit: Payer: Self-pay

## 2023-07-19 ENCOUNTER — Ambulatory Visit
Admission: RE | Admit: 2023-07-19 | Discharge: 2023-07-19 | Disposition: A | Discharge status: Home / Self Care | Payer: PPO | Source: Ambulatory Visit | Attending: Radiation Oncology | Admitting: Radiation Oncology

## 2023-07-19 ENCOUNTER — Telehealth: Payer: Self-pay

## 2023-07-19 DIAGNOSIS — C3411 Malignant neoplasm of upper lobe, right bronchus or lung: Secondary | ICD-10-CM | POA: Diagnosis not present

## 2023-07-19 DIAGNOSIS — Z510 Encounter for antineoplastic radiation therapy: Secondary | ICD-10-CM | POA: Diagnosis not present

## 2023-07-19 LAB — RAD ONC ARIA SESSION SUMMARY
Course Elapsed Days: 21
Plan Fractions Treated to Date: 11
Plan Prescribed Dose Per Fraction: 2 Gy
Plan Total Fractions Prescribed: 35
Plan Total Prescribed Dose: 70 Gy
Reference Point Dosage Given to Date: 22 Gy
Reference Point Session Dosage Given: 2 Gy
Session Number: 11

## 2023-07-19 NOTE — Telephone Encounter (Signed)
 The patient called to check on her referral.

## 2023-07-19 NOTE — Telephone Encounter (Signed)
 The patient called in to schedule appointment. I inform her she will need a referral.

## 2023-07-20 ENCOUNTER — Other Ambulatory Visit: Payer: Self-pay

## 2023-07-20 ENCOUNTER — Encounter: Payer: Self-pay | Admitting: Internal Medicine

## 2023-07-20 ENCOUNTER — Telehealth: Payer: Self-pay

## 2023-07-20 ENCOUNTER — Ambulatory Visit
Admission: RE | Admit: 2023-07-20 | Discharge: 2023-07-20 | Disposition: A | Discharge status: Home / Self Care | Payer: PPO | Source: Ambulatory Visit | Attending: Radiation Oncology | Admitting: Radiation Oncology

## 2023-07-20 DIAGNOSIS — C3411 Malignant neoplasm of upper lobe, right bronchus or lung: Secondary | ICD-10-CM | POA: Diagnosis not present

## 2023-07-20 DIAGNOSIS — Z510 Encounter for antineoplastic radiation therapy: Secondary | ICD-10-CM | POA: Diagnosis not present

## 2023-07-20 LAB — RAD ONC ARIA SESSION SUMMARY
Course Elapsed Days: 22
Plan Fractions Treated to Date: 12
Plan Prescribed Dose Per Fraction: 2 Gy
Plan Total Fractions Prescribed: 35
Plan Total Prescribed Dose: 70 Gy
Reference Point Dosage Given to Date: 24 Gy
Reference Point Session Dosage Given: 2 Gy
Session Number: 12

## 2023-07-20 NOTE — Telephone Encounter (Signed)
 Gave pt 1st available appt. Per pt she can not wait until April to see md. Per pt will call [facility] to see if she can get appt. Sooner.

## 2023-07-20 NOTE — Addendum Note (Signed)
 Encounter addended by: Edward Qualia on: 07/20/2023 12:20 PM  Actions taken: Imaging Exam ended

## 2023-07-21 ENCOUNTER — Ambulatory Visit: Payer: PPO

## 2023-07-22 ENCOUNTER — Ambulatory Visit
Admission: RE | Admit: 2023-07-22 | Discharge: 2023-07-22 | Disposition: A | Discharge status: Home / Self Care | Payer: PPO | Source: Ambulatory Visit | Attending: Radiation Oncology | Admitting: Radiation Oncology

## 2023-07-22 ENCOUNTER — Ambulatory Visit: Payer: PPO | Admitting: Oncology

## 2023-07-22 ENCOUNTER — Other Ambulatory Visit: Payer: Self-pay | Admitting: *Deleted

## 2023-07-22 ENCOUNTER — Other Ambulatory Visit: Payer: Self-pay

## 2023-07-22 ENCOUNTER — Inpatient Hospital Stay: Payer: PPO

## 2023-07-22 ENCOUNTER — Other Ambulatory Visit: Payer: Self-pay | Admitting: Oncology

## 2023-07-22 VITALS — BP 157/83 | HR 95 | Temp 96.7°F | Resp 19 | Wt 194.0 lb

## 2023-07-22 DIAGNOSIS — Z510 Encounter for antineoplastic radiation therapy: Secondary | ICD-10-CM | POA: Diagnosis not present

## 2023-07-22 DIAGNOSIS — T451X5A Adverse effect of antineoplastic and immunosuppressive drugs, initial encounter: Secondary | ICD-10-CM | POA: Insufficient documentation

## 2023-07-22 DIAGNOSIS — C3411 Malignant neoplasm of upper lobe, right bronchus or lung: Secondary | ICD-10-CM

## 2023-07-22 LAB — RAD ONC ARIA SESSION SUMMARY
Course Elapsed Days: 24
Plan Fractions Treated to Date: 13
Plan Prescribed Dose Per Fraction: 2 Gy
Plan Total Fractions Prescribed: 35
Plan Total Prescribed Dose: 70 Gy
Reference Point Dosage Given to Date: 26 Gy
Reference Point Session Dosage Given: 2 Gy
Session Number: 13

## 2023-07-22 LAB — CBC WITH DIFFERENTIAL (CANCER CENTER ONLY)
Abs Immature Granulocytes: 0.05 10*3/uL (ref 0.00–0.07)
Basophils Absolute: 0 10*3/uL (ref 0.0–0.1)
Basophils Relative: 0 %
Eosinophils Absolute: 0 10*3/uL (ref 0.0–0.5)
Eosinophils Relative: 0 %
HCT: 33.8 % — ABNORMAL LOW (ref 36.0–46.0)
Hemoglobin: 10.8 g/dL — ABNORMAL LOW (ref 12.0–15.0)
Immature Granulocytes: 2 %
Lymphocytes Relative: 24 %
Lymphs Abs: 0.7 10*3/uL (ref 0.7–4.0)
MCH: 27.2 pg (ref 26.0–34.0)
MCHC: 32 g/dL (ref 30.0–36.0)
MCV: 85.1 fL (ref 80.0–100.0)
Monocytes Absolute: 0.4 10*3/uL (ref 0.1–1.0)
Monocytes Relative: 13 %
Neutro Abs: 1.7 10*3/uL (ref 1.7–7.7)
Neutrophils Relative %: 61 %
Platelet Count: 163 10*3/uL (ref 150–400)
RBC: 3.97 MIL/uL (ref 3.87–5.11)
RDW: 16.7 % — ABNORMAL HIGH (ref 11.5–15.5)
WBC Count: 2.8 10*3/uL — ABNORMAL LOW (ref 4.0–10.5)
nRBC: 0 % (ref 0.0–0.2)

## 2023-07-22 LAB — CMP (CANCER CENTER ONLY)
ALT: 26 U/L (ref 0–44)
AST: 18 U/L (ref 15–41)
Albumin: 3.2 g/dL — ABNORMAL LOW (ref 3.5–5.0)
Alkaline Phosphatase: 47 U/L (ref 38–126)
Anion gap: 9 (ref 5–15)
BUN: 12 mg/dL (ref 8–23)
CO2: 22 mmol/L (ref 22–32)
Calcium: 8.6 mg/dL — ABNORMAL LOW (ref 8.9–10.3)
Chloride: 106 mmol/L (ref 98–111)
Creatinine: 0.68 mg/dL (ref 0.44–1.00)
GFR, Estimated: >60 mL/min (ref >60)
Glucose, Bld: 207 mg/dL — ABNORMAL HIGH (ref 70–99)
Potassium: 3.1 mmol/L — ABNORMAL LOW (ref 3.5–5.1)
Sodium: 137 mmol/L (ref 135–145)
Total Bilirubin: 0.5 mg/dL (ref 0.0–1.2)
Total Protein: 6.1 g/dL — ABNORMAL LOW (ref 6.5–8.1)

## 2023-07-22 MED ORDER — SUCRALFATE 1 G PO TABS: 1.0000 g | ORAL_TABLET | Freq: Three times a day (TID) | ORAL | 0 refills | Status: AC

## 2023-07-22 MED ORDER — DIPHENHYDRAMINE HCL 50 MG/ML IJ SOLN: 50.0000 mg | Freq: Once | INTRAMUSCULAR | Status: DC

## 2023-07-22 MED ORDER — SODIUM CHLORIDE 0.9 % IV SOLN
INTRAVENOUS | Status: DC
Start: 2023-07-22 — End: 2023-07-22
  Filled 2023-07-22: qty 250

## 2023-07-22 MED ORDER — DEXAMETHASONE SODIUM PHOSPHATE 10 MG/ML IJ SOLN
10.0000 mg | Freq: Once | INTRAMUSCULAR | Status: AC
  Administered 2023-07-22: 10 mg via INTRAVENOUS
  Filled 2023-07-22: qty 1

## 2023-07-22 MED ORDER — PALONOSETRON HCL INJECTION 0.25 MG/5ML
0.2500 mg | Freq: Once | INTRAVENOUS | Status: AC
  Administered 2023-07-22: 0.25 mg via INTRAVENOUS
  Filled 2023-07-22: qty 5

## 2023-07-22 MED ORDER — SODIUM CHLORIDE 0.9 % IV SOLN
45.0000 mg/m2 | Freq: Once | INTRAVENOUS | Status: AC
  Administered 2023-07-22: 90 mg via INTRAVENOUS
  Filled 2023-07-22: qty 15

## 2023-07-22 MED ORDER — SODIUM CHLORIDE 0.9 % IV SOLN
202.2000 mg | Freq: Once | INTRAVENOUS | Status: AC
  Administered 2023-07-22: 200 mg via INTRAVENOUS
  Filled 2023-07-22: qty 20

## 2023-07-22 MED ORDER — FAMOTIDINE IN NACL 20-0.9 MG/50ML-% IV SOLN
20.0000 mg | Freq: Once | INTRAVENOUS | Status: AC
  Administered 2023-07-22: 20 mg via INTRAVENOUS
  Filled 2023-07-22: qty 50

## 2023-07-22 MED ORDER — HEPARIN SOD (PORK) LOCK FLUSH 100 UNIT/ML IV SOLN
500.0000 [IU] | Freq: Once | INTRAVENOUS | Status: AC | PRN
Start: 2023-07-22 — End: 2023-07-22
  Administered 2023-07-22: 500 [IU]
  Filled 2023-07-22: qty 5

## 2023-07-22 MED ORDER — DIPHENHYDRAMINE HCL 50 MG/ML IJ SOLN
25.0000 mg | Freq: Once | INTRAMUSCULAR | Status: AC
  Administered 2023-07-22: 25 mg via INTRAVENOUS
  Filled 2023-07-22: qty 1

## 2023-07-22 MED ORDER — POTASSIUM CHLORIDE CRYS ER 20 MEQ PO TBCR
20.0000 meq | EXTENDED_RELEASE_TABLET | Freq: Every day | ORAL | 1 refills | Status: DC
Start: 2023-07-22 — End: 2023-12-20

## 2023-07-22 NOTE — Patient Instructions (Signed)
 [facility] - A DEPT OF [facility]  Discharge Instructions: Thank you for choosing [facility] to provide your oncology and hematology care.  If you have a lab appointment with the Cancer Center, please go directly to the Cancer Center and check in at the registration area.  Wear comfortable clothing and clothing appropriate for easy access to any Portacath or PICC line.   We strive to give you quality time with your provider. You may need to reschedule your appointment if you arrive late (15 or more minutes).  Arriving late affects you and other patients whose appointments are after yours.  Also, if you miss three or more appointments without notifying the office, you may be dismissed from the clinic at the provider's discretion.      For prescription refill requests, have your pharmacy contact our office and allow 72 hours for refills to be completed.    Today you received the following chemotherapy and/or immunotherapy agents taxol and carboplatin      To help prevent nausea and vomiting after your treatment, we encourage you to take your nausea medication as directed.  BELOW ARE SYMPTOMS THAT SHOULD BE REPORTED IMMEDIATELY: *FEVER GREATER THAN 100.4 F (38 C) OR HIGHER *CHILLS OR SWEATING *NAUSEA AND VOMITING THAT IS NOT CONTROLLED WITH YOUR NAUSEA MEDICATION *UNUSUAL SHORTNESS OF BREATH *UNUSUAL BRUISING OR BLEEDING *URINARY PROBLEMS (pain or burning when urinating, or frequent urination) *BOWEL PROBLEMS (unusual diarrhea, constipation, pain near the anus) TENDERNESS IN MOUTH AND THROAT WITH OR WITHOUT PRESENCE OF ULCERS (sore throat, sores in mouth, or a toothache) UNUSUAL RASH, SWELLING OR PAIN  UNUSUAL VAGINAL DISCHARGE OR ITCHING   Items with * indicate a potential emergency and should be followed up as soon as possible or go to the Emergency Department if any problems should occur.  Please show the CHEMOTHERAPY ALERT CARD or  IMMUNOTHERAPY ALERT CARD at check-in to the Emergency Department and triage nurse.  Should you have questions after your visit or need to cancel or reschedule your appointment, please contact [facility] - A DEPT OF [facility]  [phone] and follow the prompts.  Office hours are 8:00 a.m. to 4:30 p.m. Monday - Friday. Please note that voicemails left after 4:00 p.m. may not be returned until the following business day.  We are closed weekends and major holidays. You have access to a nurse at all times for urgent questions. Please call the main number to the clinic [phone] and follow the prompts.  For any non-urgent questions, you may also contact your provider using MyChart. We now offer e-Visits for anyone 42 and older to request care online for non-urgent symptoms. For details visit [email or web address].   Also download the MyChart app! Go to the app store, search "MyChart", open the app, select [facility], and log in with your MyChart username and password.

## 2023-07-24 ENCOUNTER — Ambulatory Visit: Payer: PPO

## 2023-07-25 ENCOUNTER — Other Ambulatory Visit: Payer: Self-pay

## 2023-07-25 ENCOUNTER — Ambulatory Visit
Admission: RE | Admit: 2023-07-25 | Discharge: 2023-07-25 | Disposition: A | Discharge status: Home / Self Care | Payer: PPO | Source: Ambulatory Visit | Attending: Radiation Oncology | Admitting: Radiation Oncology

## 2023-07-25 ENCOUNTER — Inpatient Hospital Stay: Payer: PPO

## 2023-07-25 DIAGNOSIS — Z7722 Contact with and (suspected) exposure to environmental tobacco smoke (acute) (chronic): Secondary | ICD-10-CM | POA: Insufficient documentation

## 2023-07-25 DIAGNOSIS — C3411 Malignant neoplasm of upper lobe, right bronchus or lung: Secondary | ICD-10-CM | POA: Insufficient documentation

## 2023-07-25 DIAGNOSIS — Z79899 Other long term (current) drug therapy: Secondary | ICD-10-CM | POA: Insufficient documentation

## 2023-07-25 DIAGNOSIS — T451X5A Adverse effect of antineoplastic and immunosuppressive drugs, initial encounter: Secondary | ICD-10-CM

## 2023-07-25 DIAGNOSIS — Z5189 Encounter for other specified aftercare: Secondary | ICD-10-CM | POA: Insufficient documentation

## 2023-07-25 DIAGNOSIS — Z510 Encounter for antineoplastic radiation therapy: Secondary | ICD-10-CM | POA: Insufficient documentation

## 2023-07-25 DIAGNOSIS — Z5111 Encounter for antineoplastic chemotherapy: Secondary | ICD-10-CM | POA: Insufficient documentation

## 2023-07-25 DIAGNOSIS — C778 Secondary and unspecified malignant neoplasm of lymph nodes of multiple regions: Secondary | ICD-10-CM | POA: Insufficient documentation

## 2023-07-25 LAB — RAD ONC ARIA SESSION SUMMARY
Course Elapsed Days: 27
Plan Fractions Treated to Date: 14
Plan Prescribed Dose Per Fraction: 2 Gy
Plan Total Fractions Prescribed: 35
Plan Total Prescribed Dose: 70 Gy
Reference Point Dosage Given to Date: 28 Gy
Reference Point Session Dosage Given: 2 Gy
Session Number: 14

## 2023-07-25 MED ORDER — FILGRASTIM-SNDZ 480 MCG/0.8ML IJ SOSY
480.0000 ug | PREFILLED_SYRINGE | Freq: Every day | INTRAMUSCULAR | Status: DC
  Administered 2023-07-25: 480 ug via SUBCUTANEOUS
  Filled 2023-07-25: qty 0.8

## 2023-07-26 ENCOUNTER — Ambulatory Visit
Admission: RE | Admit: 2023-07-26 | Discharge: 2023-07-26 | Disposition: A | Discharge status: Home / Self Care | Payer: PPO | Source: Ambulatory Visit | Attending: Radiation Oncology | Admitting: Radiation Oncology

## 2023-07-26 ENCOUNTER — Other Ambulatory Visit: Payer: Self-pay | Admitting: *Deleted

## 2023-07-26 ENCOUNTER — Other Ambulatory Visit: Payer: Self-pay

## 2023-07-26 ENCOUNTER — Inpatient Hospital Stay: Payer: PPO

## 2023-07-26 DIAGNOSIS — Z7901 Long term (current) use of anticoagulants: Secondary | ICD-10-CM | POA: Diagnosis not present

## 2023-07-26 DIAGNOSIS — T451X5A Adverse effect of antineoplastic and immunosuppressive drugs, initial encounter: Secondary | ICD-10-CM

## 2023-07-26 DIAGNOSIS — Z86711 Personal history of pulmonary embolism: Secondary | ICD-10-CM | POA: Diagnosis not present

## 2023-07-26 DIAGNOSIS — C3411 Malignant neoplasm of upper lobe, right bronchus or lung: Secondary | ICD-10-CM | POA: Diagnosis not present

## 2023-07-26 DIAGNOSIS — Z510 Encounter for antineoplastic radiation therapy: Secondary | ICD-10-CM | POA: Diagnosis not present

## 2023-07-26 LAB — RAD ONC ARIA SESSION SUMMARY
Course Elapsed Days: 28
Plan Fractions Treated to Date: 15
Plan Prescribed Dose Per Fraction: 2 Gy
Plan Total Fractions Prescribed: 35
Plan Total Prescribed Dose: 70 Gy
Reference Point Dosage Given to Date: 30 Gy
Reference Point Session Dosage Given: 2 Gy
Session Number: 15

## 2023-07-26 MED ORDER — FLUCONAZOLE 100 MG PO TABS: 100.0000 mg | ORAL_TABLET | Freq: Every day | ORAL | 0 refills | Status: DC

## 2023-07-26 MED ORDER — FILGRASTIM-SNDZ 480 MCG/0.8ML IJ SOSY
480.0000 ug | PREFILLED_SYRINGE | Freq: Every day | INTRAMUSCULAR | Status: DC
  Administered 2023-07-26: 480 ug via SUBCUTANEOUS
  Filled 2023-07-26: qty 0.8

## 2023-07-27 ENCOUNTER — Ambulatory Visit
Admission: RE | Admit: 2023-07-27 | Discharge: 2023-07-27 | Disposition: A | Discharge status: Home / Self Care | Payer: PPO | Source: Ambulatory Visit | Attending: Radiation Oncology | Admitting: Radiation Oncology

## 2023-07-27 ENCOUNTER — Inpatient Hospital Stay: Payer: PPO

## 2023-07-27 ENCOUNTER — Other Ambulatory Visit: Payer: Self-pay

## 2023-07-27 VITALS — BP 106/66 | HR 101 | Temp 97.5°F

## 2023-07-27 DIAGNOSIS — Z510 Encounter for antineoplastic radiation therapy: Secondary | ICD-10-CM | POA: Diagnosis not present

## 2023-07-27 DIAGNOSIS — C3411 Malignant neoplasm of upper lobe, right bronchus or lung: Secondary | ICD-10-CM | POA: Diagnosis not present

## 2023-07-27 DIAGNOSIS — D701 Agranulocytosis secondary to cancer chemotherapy: Secondary | ICD-10-CM

## 2023-07-27 LAB — RAD ONC ARIA SESSION SUMMARY
Course Elapsed Days: 29
Plan Fractions Treated to Date: 16
Plan Prescribed Dose Per Fraction: 2 Gy
Plan Total Fractions Prescribed: 35
Plan Total Prescribed Dose: 70 Gy
Reference Point Dosage Given to Date: 32 Gy
Reference Point Session Dosage Given: 2 Gy
Session Number: 16

## 2023-07-27 MED ORDER — FILGRASTIM-SNDZ 480 MCG/0.8ML IJ SOSY
480.0000 ug | PREFILLED_SYRINGE | Freq: Every day | INTRAMUSCULAR | Status: DC
  Administered 2023-07-27: 480 ug via SUBCUTANEOUS
  Filled 2023-07-27: qty 0.8

## 2023-07-28 ENCOUNTER — Other Ambulatory Visit: Payer: Self-pay

## 2023-07-28 ENCOUNTER — Ambulatory Visit
Admission: RE | Admit: 2023-07-28 | Discharge: 2023-07-28 | Disposition: A | Discharge status: Home / Self Care | Payer: PPO | Source: Ambulatory Visit | Attending: Radiation Oncology | Admitting: Radiation Oncology

## 2023-07-28 ENCOUNTER — Ambulatory Visit: Payer: PPO

## 2023-07-28 DIAGNOSIS — Z510 Encounter for antineoplastic radiation therapy: Secondary | ICD-10-CM | POA: Diagnosis not present

## 2023-07-28 DIAGNOSIS — C3411 Malignant neoplasm of upper lobe, right bronchus or lung: Secondary | ICD-10-CM | POA: Diagnosis not present

## 2023-07-28 LAB — RAD ONC ARIA SESSION SUMMARY
Course Elapsed Days: 30
Plan Fractions Treated to Date: 17
Plan Prescribed Dose Per Fraction: 2 Gy
Plan Total Fractions Prescribed: 35
Plan Total Prescribed Dose: 70 Gy
Reference Point Dosage Given to Date: 34 Gy
Reference Point Session Dosage Given: 2 Gy
Session Number: 17

## 2023-07-29 ENCOUNTER — Encounter: Payer: Self-pay | Admitting: Oncology

## 2023-07-29 ENCOUNTER — Inpatient Hospital Stay (HOSPITAL_BASED_OUTPATIENT_CLINIC_OR_DEPARTMENT_OTHER): Payer: PPO | Admitting: Oncology

## 2023-07-29 ENCOUNTER — Ambulatory Visit
Admission: RE | Admit: 2023-07-29 | Discharge: 2023-07-29 | Disposition: A | Discharge status: Home / Self Care | Payer: PPO | Source: Ambulatory Visit | Attending: Radiation Oncology | Admitting: Radiation Oncology

## 2023-07-29 ENCOUNTER — Encounter: Payer: Self-pay | Admitting: Internal Medicine

## 2023-07-29 ENCOUNTER — Inpatient Hospital Stay: Payer: PPO

## 2023-07-29 ENCOUNTER — Other Ambulatory Visit: Payer: Self-pay

## 2023-07-29 VITALS — BP 154/79 | HR 97

## 2023-07-29 VITALS — BP 117/80 | HR 90 | Temp 97.9°F | Resp 16 | Wt 191.0 lb

## 2023-07-29 DIAGNOSIS — C3411 Malignant neoplasm of upper lobe, right bronchus or lung: Secondary | ICD-10-CM

## 2023-07-29 DIAGNOSIS — C3490 Malignant neoplasm of unspecified part of unspecified bronchus or lung: Secondary | ICD-10-CM

## 2023-07-29 DIAGNOSIS — E876 Hypokalemia: Secondary | ICD-10-CM | POA: Diagnosis not present

## 2023-07-29 DIAGNOSIS — Z5111 Encounter for antineoplastic chemotherapy: Secondary | ICD-10-CM | POA: Diagnosis not present

## 2023-07-29 DIAGNOSIS — Z510 Encounter for antineoplastic radiation therapy: Secondary | ICD-10-CM | POA: Diagnosis not present

## 2023-07-29 LAB — RAD ONC ARIA SESSION SUMMARY
Course Elapsed Days: 31
Plan Fractions Treated to Date: 18
Plan Prescribed Dose Per Fraction: 2 Gy
Plan Total Fractions Prescribed: 35
Plan Total Prescribed Dose: 70 Gy
Reference Point Dosage Given to Date: 36 Gy
Reference Point Session Dosage Given: 2 Gy
Session Number: 18

## 2023-07-29 LAB — CBC WITH DIFFERENTIAL (CANCER CENTER ONLY)
Abs Immature Granulocytes: 0.27 10*3/uL — ABNORMAL HIGH (ref 0.00–0.07)
Basophils Absolute: 0.1 10*3/uL (ref 0.0–0.1)
Basophils Relative: 2 %
Eosinophils Absolute: 0 10*3/uL (ref 0.0–0.5)
Eosinophils Relative: 1 %
HCT: 34.6 % — ABNORMAL LOW (ref 36.0–46.0)
Hemoglobin: 11.2 g/dL — ABNORMAL LOW (ref 12.0–15.0)
Immature Granulocytes: 7 %
Lymphocytes Relative: 17 %
Lymphs Abs: 0.7 10*3/uL (ref 0.7–4.0)
MCH: 27.4 pg (ref 26.0–34.0)
MCHC: 32.4 g/dL (ref 30.0–36.0)
MCV: 84.6 fL (ref 80.0–100.0)
Monocytes Absolute: 0.7 10*3/uL (ref 0.1–1.0)
Monocytes Relative: 17 %
Neutro Abs: 2.3 10*3/uL (ref 1.7–7.7)
Neutrophils Relative %: 56 %
Platelet Count: 170 10*3/uL (ref 150–400)
RBC: 4.09 MIL/uL (ref 3.87–5.11)
RDW: 18.3 % — ABNORMAL HIGH (ref 11.5–15.5)
Smear Review: NORMAL
WBC Count: 4.1 10*3/uL (ref 4.0–10.5)
nRBC: 0.7 % — ABNORMAL HIGH (ref 0.0–0.2)

## 2023-07-29 LAB — CMP (CANCER CENTER ONLY)
ALT: 29 U/L (ref 0–44)
AST: 19 U/L (ref 15–41)
Albumin: 3.5 g/dL (ref 3.5–5.0)
Alkaline Phosphatase: 68 U/L (ref 38–126)
Anion gap: 10 (ref 5–15)
BUN: 15 mg/dL (ref 8–23)
CO2: 22 mmol/L (ref 22–32)
Calcium: 8.5 mg/dL — ABNORMAL LOW (ref 8.9–10.3)
Chloride: 102 mmol/L (ref 98–111)
Creatinine: 0.75 mg/dL (ref 0.44–1.00)
GFR, Estimated: >60 mL/min (ref >60)
Glucose, Bld: 221 mg/dL — ABNORMAL HIGH (ref 70–99)
Potassium: 3.5 mmol/L (ref 3.5–5.1)
Sodium: 134 mmol/L — ABNORMAL LOW (ref 135–145)
Total Bilirubin: 0.4 mg/dL (ref 0.0–1.2)
Total Protein: 6.4 g/dL — ABNORMAL LOW (ref 6.5–8.1)

## 2023-07-29 MED ORDER — SODIUM CHLORIDE 0.9 % IV SOLN
45.0000 mg/m2 | Freq: Once | INTRAVENOUS | Status: AC
  Administered 2023-07-29: 90 mg via INTRAVENOUS
  Filled 2023-07-29: qty 15

## 2023-07-29 MED ORDER — FAMOTIDINE IN NACL 20-0.9 MG/50ML-% IV SOLN
20.0000 mg | Freq: Once | INTRAVENOUS | Status: AC
  Administered 2023-07-29: 20 mg via INTRAVENOUS
  Filled 2023-07-29: qty 50

## 2023-07-29 MED ORDER — HEPARIN SOD (PORK) LOCK FLUSH 100 UNIT/ML IV SOLN
500.0000 [IU] | Freq: Once | INTRAVENOUS | Status: AC | PRN
  Administered 2023-07-29: 500 [IU]
  Filled 2023-07-29: qty 5

## 2023-07-29 MED ORDER — SODIUM CHLORIDE 0.9 % IV SOLN
INTRAVENOUS | Status: DC
  Filled 2023-07-29 (×2): qty 250

## 2023-07-29 MED ORDER — DEXAMETHASONE SODIUM PHOSPHATE 10 MG/ML IJ SOLN
10.0000 mg | Freq: Once | INTRAMUSCULAR | Status: AC
  Administered 2023-07-29: 10 mg via INTRAVENOUS
  Filled 2023-07-29: qty 1

## 2023-07-29 MED ORDER — SODIUM CHLORIDE 0.9% FLUSH
10.0000 mL | INTRAVENOUS | Status: DC | PRN
Start: 2023-07-29 — End: 2023-07-29
  Administered 2023-07-29: 10 mL
  Filled 2023-07-29: qty 10

## 2023-07-29 MED ORDER — PALONOSETRON HCL INJECTION 0.25 MG/5ML
0.2500 mg | Freq: Once | INTRAVENOUS | Status: AC
Start: 2023-07-29 — End: 2023-07-29
  Administered 2023-07-29: 0.25 mg via INTRAVENOUS
  Filled 2023-07-29: qty 5

## 2023-07-29 MED ORDER — DIPHENHYDRAMINE HCL 50 MG/ML IJ SOLN
25.0000 mg | Freq: Once | INTRAMUSCULAR | Status: AC
  Administered 2023-07-29: 25 mg via INTRAVENOUS
  Filled 2023-07-29: qty 1

## 2023-07-29 MED ORDER — SODIUM CHLORIDE 0.9 % IV SOLN
202.2000 mg | Freq: Once | INTRAVENOUS | Status: AC
  Administered 2023-07-29: 200 mg via INTRAVENOUS
  Filled 2023-07-29: qty 20

## 2023-07-29 NOTE — Patient Instructions (Signed)
 [facility] - A DEPT OF [facility]  Discharge Instructions: Thank you for choosing [facility] to provide your oncology and hematology care.  If you have a lab appointment with the Cancer Center, please go directly to the Cancer Center and check in at the registration area.  Wear comfortable clothing and clothing appropriate for easy access to any Portacath or PICC line.   We strive to give you quality time with your provider. You may need to reschedule your appointment if you arrive late (15 or more minutes).  Arriving late affects you and other patients whose appointments are after yours.  Also, if you miss three or more appointments without notifying the office, you may be dismissed from the clinic at the provider's discretion.      For prescription refill requests, have your pharmacy contact our office and allow 72 hours for refills to be completed.    Today you received the following chemotherapy and/or immunotherapy agents- taxol, carboplatin      To help prevent nausea and vomiting after your treatment, we encourage you to take your nausea medication as directed.  BELOW ARE SYMPTOMS THAT SHOULD BE REPORTED IMMEDIATELY: *FEVER GREATER THAN 100.4 F (38 C) OR HIGHER *CHILLS OR SWEATING *NAUSEA AND VOMITING THAT IS NOT CONTROLLED WITH YOUR NAUSEA MEDICATION *UNUSUAL SHORTNESS OF BREATH *UNUSUAL BRUISING OR BLEEDING *URINARY PROBLEMS (pain or burning when urinating, or frequent urination) *BOWEL PROBLEMS (unusual diarrhea, constipation, pain near the anus) TENDERNESS IN MOUTH AND THROAT WITH OR WITHOUT PRESENCE OF ULCERS (sore throat, sores in mouth, or a toothache) UNUSUAL RASH, SWELLING OR PAIN  UNUSUAL VAGINAL DISCHARGE OR ITCHING   Items with * indicate a potential emergency and should be followed up as soon as possible or go to the Emergency Department if any problems should occur.  Please show the CHEMOTHERAPY ALERT CARD or  IMMUNOTHERAPY ALERT CARD at check-in to the Emergency Department and triage nurse.  Should you have questions after your visit or need to cancel or reschedule your appointment, please contact [facility] - A DEPT OF [facility]  [phone] and follow the prompts.  Office hours are 8:00 a.m. to 4:30 p.m. Monday - Friday. Please note that voicemails left after 4:00 p.m. may not be returned until the following business day.  We are closed weekends and major holidays. You have access to a nurse at all times for urgent questions. Please call the main number to the clinic [phone] and follow the prompts.  For any non-urgent questions, you may also contact your provider using MyChart. We now offer e-Visits for anyone 36 and older to request care online for non-urgent symptoms. For details visit [email or web address].   Also download the MyChart app! Go to the app store, search "MyChart", open the app, select [place], and log in with your MyChart username and password.

## 2023-08-01 ENCOUNTER — Ambulatory Visit

## 2023-08-01 ENCOUNTER — Ambulatory Visit: Payer: PPO

## 2023-08-02 ENCOUNTER — Encounter: Payer: Self-pay | Admitting: Emergency Medicine

## 2023-08-02 ENCOUNTER — Inpatient Hospital Stay
Admission: EM | Admit: 2023-08-02 | Discharge: 2023-08-03 | DRG: 393 | Disposition: A | Discharge status: Home / Self Care | Attending: Internal Medicine | Admitting: Internal Medicine

## 2023-08-02 ENCOUNTER — Other Ambulatory Visit: Payer: Self-pay

## 2023-08-02 ENCOUNTER — Ambulatory Visit: Payer: PPO

## 2023-08-02 ENCOUNTER — Ambulatory Visit

## 2023-08-02 ENCOUNTER — Emergency Department

## 2023-08-02 DIAGNOSIS — E669 Obesity, unspecified: Secondary | ICD-10-CM | POA: Diagnosis present

## 2023-08-02 DIAGNOSIS — Z86718 Personal history of other venous thrombosis and embolism: Secondary | ICD-10-CM | POA: Diagnosis not present

## 2023-08-02 DIAGNOSIS — Z7984 Long term (current) use of oral hypoglycemic drugs: Secondary | ICD-10-CM | POA: Diagnosis not present

## 2023-08-02 DIAGNOSIS — Z9221 Personal history of antineoplastic chemotherapy: Secondary | ICD-10-CM

## 2023-08-02 DIAGNOSIS — Z7901 Long term (current) use of anticoagulants: Secondary | ICD-10-CM | POA: Diagnosis not present

## 2023-08-02 DIAGNOSIS — M810 Age-related osteoporosis without current pathological fracture: Secondary | ICD-10-CM | POA: Diagnosis not present

## 2023-08-02 DIAGNOSIS — R9431 Abnormal electrocardiogram [ECG] [EKG]: Secondary | ICD-10-CM | POA: Diagnosis not present

## 2023-08-02 DIAGNOSIS — I110 Hypertensive heart disease with heart failure: Secondary | ICD-10-CM | POA: Diagnosis present

## 2023-08-02 DIAGNOSIS — D62 Acute posthemorrhagic anemia: Secondary | ICD-10-CM | POA: Diagnosis not present

## 2023-08-02 DIAGNOSIS — Z885 Allergy status to narcotic agent status: Secondary | ICD-10-CM | POA: Diagnosis not present

## 2023-08-02 DIAGNOSIS — E861 Hypovolemia: Secondary | ICD-10-CM | POA: Diagnosis present

## 2023-08-02 DIAGNOSIS — K625 Hemorrhage of anus and rectum: Secondary | ICD-10-CM

## 2023-08-02 DIAGNOSIS — R578 Other shock: Secondary | ICD-10-CM | POA: Diagnosis present

## 2023-08-02 DIAGNOSIS — E119 Type 2 diabetes mellitus without complications: Secondary | ICD-10-CM | POA: Diagnosis not present

## 2023-08-02 DIAGNOSIS — Z803 Family history of malignant neoplasm of breast: Secondary | ICD-10-CM | POA: Diagnosis not present

## 2023-08-02 DIAGNOSIS — I10 Essential (primary) hypertension: Secondary | ICD-10-CM | POA: Diagnosis present

## 2023-08-02 DIAGNOSIS — D6832 Hemorrhagic disorder due to extrinsic circulating anticoagulants: Secondary | ICD-10-CM | POA: Diagnosis not present

## 2023-08-02 DIAGNOSIS — R791 Abnormal coagulation profile: Secondary | ICD-10-CM | POA: Diagnosis not present

## 2023-08-02 DIAGNOSIS — Z7722 Contact with and (suspected) exposure to environmental tobacco smoke (acute) (chronic): Secondary | ICD-10-CM | POA: Diagnosis not present

## 2023-08-02 DIAGNOSIS — R000 Tachycardia, unspecified: Secondary | ICD-10-CM | POA: Diagnosis not present

## 2023-08-02 DIAGNOSIS — R531 Weakness: Secondary | ICD-10-CM | POA: Diagnosis not present

## 2023-08-02 DIAGNOSIS — Z86711 Personal history of pulmonary embolism: Secondary | ICD-10-CM | POA: Diagnosis not present

## 2023-08-02 DIAGNOSIS — K920 Hematemesis: Secondary | ICD-10-CM | POA: Diagnosis present

## 2023-08-02 DIAGNOSIS — I700 Atherosclerosis of aorta: Secondary | ICD-10-CM | POA: Diagnosis not present

## 2023-08-02 DIAGNOSIS — E1169 Type 2 diabetes mellitus with other specified complication: Secondary | ICD-10-CM | POA: Diagnosis not present

## 2023-08-02 DIAGNOSIS — T45515A Adverse effect of anticoagulants, initial encounter: Secondary | ICD-10-CM | POA: Diagnosis not present

## 2023-08-02 DIAGNOSIS — Z9049 Acquired absence of other specified parts of digestive tract: Secondary | ICD-10-CM

## 2023-08-02 DIAGNOSIS — R739 Hyperglycemia, unspecified: Secondary | ICD-10-CM | POA: Diagnosis not present

## 2023-08-02 DIAGNOSIS — Z91048 Other nonmedicinal substance allergy status: Secondary | ICD-10-CM | POA: Diagnosis not present

## 2023-08-02 DIAGNOSIS — Z833 Family history of diabetes mellitus: Secondary | ICD-10-CM

## 2023-08-02 DIAGNOSIS — C3490 Malignant neoplasm of unspecified part of unspecified bronchus or lung: Secondary | ICD-10-CM | POA: Diagnosis not present

## 2023-08-02 DIAGNOSIS — Z8719 Personal history of other diseases of the digestive system: Secondary | ICD-10-CM | POA: Diagnosis not present

## 2023-08-02 DIAGNOSIS — K922 Gastrointestinal hemorrhage, unspecified: Principal | ICD-10-CM

## 2023-08-02 DIAGNOSIS — I959 Hypotension, unspecified: Secondary | ICD-10-CM | POA: Diagnosis not present

## 2023-08-02 DIAGNOSIS — Z923 Personal history of irradiation: Secondary | ICD-10-CM

## 2023-08-02 DIAGNOSIS — Z91040 Latex allergy status: Secondary | ICD-10-CM | POA: Diagnosis not present

## 2023-08-02 DIAGNOSIS — Z8249 Family history of ischemic heart disease and other diseases of the circulatory system: Secondary | ICD-10-CM | POA: Diagnosis not present

## 2023-08-02 DIAGNOSIS — K648 Other hemorrhoids: Principal | ICD-10-CM | POA: Diagnosis present

## 2023-08-02 DIAGNOSIS — I5032 Chronic diastolic (congestive) heart failure: Secondary | ICD-10-CM | POA: Diagnosis not present

## 2023-08-02 DIAGNOSIS — R0902 Hypoxemia: Secondary | ICD-10-CM | POA: Diagnosis not present

## 2023-08-02 DIAGNOSIS — K8020 Calculus of gallbladder without cholecystitis without obstruction: Secondary | ICD-10-CM | POA: Diagnosis not present

## 2023-08-02 DIAGNOSIS — Z79899 Other long term (current) drug therapy: Secondary | ICD-10-CM

## 2023-08-02 LAB — COMPREHENSIVE METABOLIC PANEL
ALT: 27 U/L (ref 0–44)
AST: 18 U/L (ref 15–41)
Albumin: 2.9 g/dL — ABNORMAL LOW (ref 3.5–5.0)
Alkaline Phosphatase: 48 U/L (ref 38–126)
Anion gap: 9 (ref 5–15)
BUN: 31 mg/dL — ABNORMAL HIGH (ref 8–23)
CO2: 21 mmol/L — ABNORMAL LOW (ref 22–32)
Calcium: 8.2 mg/dL — ABNORMAL LOW (ref 8.9–10.3)
Chloride: 101 mmol/L (ref 98–111)
Creatinine, Ser: 1.05 mg/dL — ABNORMAL HIGH (ref 0.44–1.00)
GFR, Estimated: 57 mL/min — ABNORMAL LOW (ref >60)
Glucose, Bld: 409 mg/dL — ABNORMAL HIGH (ref 70–99)
Potassium: 3.9 mmol/L (ref 3.5–5.1)
Sodium: 131 mmol/L — ABNORMAL LOW (ref 135–145)
Total Bilirubin: 0.7 mg/dL (ref 0.0–1.2)
Total Protein: 5.5 g/dL — ABNORMAL LOW (ref 6.5–8.1)

## 2023-08-02 LAB — CBC WITH DIFFERENTIAL/PLATELET
Abs Immature Granulocytes: 0.21 10*3/uL — ABNORMAL HIGH (ref 0.00–0.07)
Basophils Absolute: 0.1 10*3/uL (ref 0.0–0.1)
Basophils Relative: 1 %
Eosinophils Absolute: 0 10*3/uL (ref 0.0–0.5)
Eosinophils Relative: 0 %
HCT: 31 % — ABNORMAL LOW (ref 36.0–46.0)
Hemoglobin: 10.6 g/dL — ABNORMAL LOW (ref 12.0–15.0)
Immature Granulocytes: 5 %
Lymphocytes Relative: 15 %
Lymphs Abs: 0.7 10*3/uL (ref 0.7–4.0)
MCH: 29.6 pg (ref 26.0–34.0)
MCHC: 34.2 g/dL (ref 30.0–36.0)
MCV: 86.6 fL (ref 80.0–100.0)
Monocytes Absolute: 0.2 10*3/uL (ref 0.1–1.0)
Monocytes Relative: 3 %
Neutro Abs: 3.4 10*3/uL (ref 1.7–7.7)
Neutrophils Relative %: 76 %
Platelets: 93 10*3/uL — ABNORMAL LOW (ref 150–400)
RBC: 3.58 MIL/uL — ABNORMAL LOW (ref 3.87–5.11)
RDW: 17.2 % — ABNORMAL HIGH (ref 11.5–15.5)
Smear Review: NORMAL
WBC: 4.5 10*3/uL (ref 4.0–10.5)
nRBC: 0 % (ref 0.0–0.2)

## 2023-08-02 LAB — CBC
HCT: 32.2 % — ABNORMAL LOW (ref 36.0–46.0)
HCT: 30.3 % — ABNORMAL LOW (ref 36.0–46.0)
Hemoglobin: 10.4 g/dL — ABNORMAL LOW (ref 12.0–15.0)
Hemoglobin: 10.2 g/dL — ABNORMAL LOW (ref 12.0–15.0)
MCH: 27.4 pg (ref 26.0–34.0)
MCH: 27.9 pg (ref 26.0–34.0)
MCHC: 32.3 g/dL (ref 30.0–36.0)
MCHC: 33.7 g/dL (ref 30.0–36.0)
MCV: 85 fL (ref 80.0–100.0)
MCV: 82.8 fL (ref 80.0–100.0)
Platelets: 142 10*3/uL — ABNORMAL LOW (ref 150–400)
Platelets: 119 10*3/uL — ABNORMAL LOW (ref 150–400)
RBC: 3.79 MIL/uL — ABNORMAL LOW (ref 3.87–5.11)
RBC: 3.66 MIL/uL — ABNORMAL LOW (ref 3.87–5.11)
RDW: 18.6 % — ABNORMAL HIGH (ref 11.5–15.5)
RDW: 17.9 % — ABNORMAL HIGH (ref 11.5–15.5)
WBC: 4.6 10*3/uL (ref 4.0–10.5)
WBC: 3.8 10*3/uL — ABNORMAL LOW (ref 4.0–10.5)
nRBC: 0.7 % — ABNORMAL HIGH (ref 0.0–0.2)
nRBC: 0 % (ref 0.0–0.2)

## 2023-08-02 LAB — PROTIME-INR
INR: 9.8 (ref 0.8–1.2)
INR: 1.5 — ABNORMAL HIGH (ref 0.8–1.2)
Prothrombin Time: 78.6 s — ABNORMAL HIGH (ref 11.4–15.2)
Prothrombin Time: 18.2 s — ABNORMAL HIGH (ref 11.4–15.2)

## 2023-08-02 LAB — CBG MONITORING, ED
Glucose-Capillary: 221 mg/dL — ABNORMAL HIGH (ref 70–99)
Glucose-Capillary: 261 mg/dL — ABNORMAL HIGH (ref 70–99)

## 2023-08-02 LAB — PREPARE RBC (CROSSMATCH)

## 2023-08-02 LAB — ABO/RH: ABO/RH(D): O NEG

## 2023-08-02 LAB — APTT: aPTT: 49 s — ABNORMAL HIGH (ref 24–36)

## 2023-08-02 LAB — LACTIC ACID, PLASMA
Lactic Acid, Venous: 2.7 mmol/L (ref 0.5–1.9)
Lactic Acid, Venous: 2 mmol/L (ref 0.5–1.9)

## 2023-08-02 MED ORDER — INSULIN ASPART 100 UNIT/ML IJ SOLN
0.0000 [IU] | Freq: Three times a day (TID) | INTRAMUSCULAR | Status: DC
  Administered 2023-08-02: 5 [IU] via SUBCUTANEOUS
  Administered 2023-08-03: 2 [IU] via SUBCUTANEOUS
  Administered 2023-08-03: 8 [IU] via SUBCUTANEOUS
  Filled 2023-08-02 (×3): qty 1

## 2023-08-02 MED ORDER — VITAMIN B-12 1000 MCG PO TABS
5000.0000 ug | ORAL_TABLET | Freq: Every day | ORAL | Status: DC
  Administered 2023-08-02 – 2023-08-03 (×2): 5000 ug via ORAL
  Filled 2023-08-02 (×2): qty 5

## 2023-08-02 MED ORDER — LACTATED RINGERS IV SOLN: INTRAVENOUS | Status: AC

## 2023-08-02 MED ORDER — SODIUM CHLORIDE 0.9% FLUSH
3.0000 mL | Freq: Two times a day (BID) | INTRAVENOUS | Status: DC
  Administered 2023-08-02 – 2023-08-03 (×3): 3 mL via INTRAVENOUS

## 2023-08-02 MED ORDER — ONDANSETRON HCL 4 MG/2ML IJ SOLN: 4.0000 mg | Freq: Four times a day (QID) | INTRAMUSCULAR | Status: DC | PRN

## 2023-08-02 MED ORDER — SODIUM CHLORIDE 0.9% IV SOLUTION
Freq: Once | INTRAVENOUS | Status: DC
  Filled 2023-08-02: qty 250

## 2023-08-02 MED ORDER — SODIUM CHLORIDE 0.9 % IV SOLN: 10.0000 mL/h | Freq: Once | INTRAVENOUS | Status: DC

## 2023-08-02 MED ORDER — HYDROCODONE-ACETAMINOPHEN 5-325 MG PO TABS: 1.0000 | ORAL_TABLET | Freq: Four times a day (QID) | ORAL | Status: DC | PRN

## 2023-08-02 MED ORDER — VITAMIN K1 10 MG/ML IJ SOLN
10.0000 mg | INTRAVENOUS | Status: AC
  Administered 2023-08-02: 10 mg via INTRAVENOUS
  Filled 2023-08-02: qty 1

## 2023-08-02 MED ORDER — ROPINIROLE HCL 1 MG PO TABS
1.0000 mg | ORAL_TABLET | Freq: Every day | ORAL | Status: DC
  Administered 2023-08-02: 1 mg via ORAL
  Filled 2023-08-02: qty 1

## 2023-08-02 MED ORDER — VITAMIN D 25 MCG (1000 UNIT) PO TABS
1000.0000 [IU] | ORAL_TABLET | Freq: Every day | ORAL | Status: DC
  Administered 2023-08-02 – 2023-08-03 (×2): 1000 [IU] via ORAL
  Filled 2023-08-02 (×2): qty 1

## 2023-08-02 MED ORDER — OXYBUTYNIN CHLORIDE 5 MG PO TABS
5.0000 mg | ORAL_TABLET | Freq: Two times a day (BID) | ORAL | Status: DC
  Administered 2023-08-02 – 2023-08-03 (×2): 5 mg via ORAL
  Filled 2023-08-02 (×2): qty 1

## 2023-08-02 MED ORDER — LACTATED RINGERS IV BOLUS
1000.0000 mL | Freq: Once | INTRAVENOUS | Status: AC
  Administered 2023-08-02: 1000 mL via INTRAVENOUS

## 2023-08-02 MED ORDER — SODIUM CHLORIDE 0.9 % IV SOLN: Freq: Once | INTRAVENOUS | Status: AC

## 2023-08-02 MED ORDER — ACETAMINOPHEN 325 MG PO TABS
975.0000 mg | ORAL_TABLET | Freq: Four times a day (QID) | ORAL | Status: DC | PRN
  Administered 2023-08-02 – 2023-08-03 (×2): 975 mg via ORAL
  Filled 2023-08-02 (×3): qty 3

## 2023-08-02 MED ORDER — IOHEXOL 350 MG/ML SOLN
100.0000 mL | Freq: Once | INTRAVENOUS | Status: AC | PRN
  Administered 2023-08-02: 100 mL via INTRAVENOUS

## 2023-08-02 MED ORDER — PROTHROMBIN COMPLEX CONC HUMAN 500 UNITS IV KIT
1566.0000 [IU] | PACK | Status: AC
  Administered 2023-08-02: 1566 [IU] via INTRAVENOUS
  Filled 2023-08-02: qty 566

## 2023-08-02 NOTE — Assessment & Plan Note (Signed)
 Patient is presenting with profound hypotension in the setting of hemorrhoidal bleed with supratherapeutic INR. Hemoglobin is only slightly lower compared to prior at 10.4, however suspect that this has not reflected her active blood loss that occurred during and just prior to examination.  Despite 1 unit, she remains hypotensive.  - Telemetry monitoring - Admit to stepdown - Additional 1 unit of packed RBC ordered - 1 L bolus - Continue maintenance fluids

## 2023-08-02 NOTE — Consult Note (Signed)
 VASCULAR & INTERVENTIONAL RADIOLOGY CONSULT -??  08/02/2023?  ?  PATIENT NAME: Kara [name] ?  CSN: [id number] ?  AGE: 73 y.o.??  DOB: June 06, 1950??  ?  Primary Care Physician: Barbette Reichmann, MD?      REQUESTING PHYSICIAN: No referring provider defined for this encounter.  ??  ROOM/BED: ED07A/ED07A??  ADMIT DATE: 08/02/2023??    HISTORY AND PHYSICAL    History of Present Illness: ?  Kara [name] is a 73 y.o. year old adult with history of new onset BRBPR/hematemesis, referred to VIR.      Anselm Lis Slates states this is the second time this has happened, and it started around 530am today.  She had 1 prior episode of this a few years ago, treated medically with reversal of AC.   Negative for complaint of pain.  Patient also denes N/V, decreased appetite.     While seeing her in the ED bed 7 at 3:15pm, she tells me that the bleeding has stopped for about 1 hour now.    Current VS's are normal HR: 80's SBP: currently 108 O2: sensor not working   History of prior abdominal surgery, with CT showing anastamosis of small bowel.  This is the site of bleeding on today's CTA.     She is prescribed coumadin for prior "blood clots".  Her INR is greater than 9 today.  She has received Vit K, and KCentra/4-factor PCC.     She is receiving her second of 2 scheduled PRBC's.    CT: Positive for bleeding.  Bleeding is at site of small bowel anastamosis.      LABS:?  ?  Lab Results  Component Value Date   WBC 3.8 (L) 08/02/2023   HGB 10.2 (L) 08/02/2023   HGB 11.2 (L) 07/29/2023   HGB 13.9 05/30/2013   HCT 30.3 (L) 08/02/2023   HCT 40.8 05/30/2013   PLT 119 (L) 08/02/2023   PLT 170 07/29/2023   PLT 181 05/30/2013  ?  Lab Results  Component Value Date   NA 131 (L) 08/02/2023   NA 139 05/30/2013   K 3.9 08/02/2023   K 3.6 05/30/2013   CL 101 08/02/2023   CL 108 (H) 05/30/2013   CO2 21 (L) 08/02/2023   CO2 27 05/30/2013   BUN 31 (H) 08/02/2023    BUN 11 05/30/2013   GLUCOSE 409 (H) 08/02/2023   GLUCOSE 98 05/30/2013    Lab Results  Component Value Date   HGBA1C 7.0 (H) 05/09/2023   HGBA1C 6.4 (H) 02/16/2014  ??  Lab Results  Component Value Date   INR 1.5 (H) 08/02/2023  ??        Medications:   Current Facility-Administered Medications on File Prior to Encounter  Medication Dose Route Frequency Provider Last Rate Last Admin   0.9 %  sodium chloride infusion   Intravenous Continuous Creig Hines, MD 10 mL/hr at 07/15/23 1012 New Bag at 07/15/23 1012   Current Outpatient Medications on File Prior to Encounter  Medication Sig Dispense Refill   bisacodyl (DULCOLAX) 10 MG suppository Place 1 suppository (10 mg total) rectally as needed for moderate constipation. 12 suppository 0   calcium carbonate (CALCIUM 600) 600 MG TABS tablet Take 1 tablet (600 mg total) by mouth 2 (two) times daily with a meal. 60 tablet 5   candesartan (ATACAND) 16 MG tablet Take 16 mg by mouth in the morning.     cholecalciferol (VITAMIN D) 25 MCG (1000 UNIT) tablet Take 1,000  Units by mouth in the morning.     Cyanocobalamin (VITAMIN B-12) 5000 MCG TBDP Take 5,000 mcg by mouth in the morning.     cyclobenzaprine (FLEXERIL) 10 MG tablet Take 10 mg by mouth at bedtime.     dexamethasone (DECADRON) 4 MG tablet Take 2 tablets daily for 2 days, start the day after chemotherapy. Take with food. 30 tablet 1   Dulaglutide 1.5 MG/0.5ML SOPN Inject 1.5 mg into the skin every Saturday.     fluconazole (DIFLUCAN) 100 MG tablet Take 1 tablet (100 mg total) by mouth daily. 10 tablet 0   gabapentin (NEURONTIN) 100 MG capsule Take 200 mg by mouth with breakfast, with lunch, and with evening meal.     gabapentin (NEURONTIN) 300 MG capsule Take 300 mg by mouth at bedtime.     glipiZIDE (GLUCOTROL) 10 MG tablet Take 10 mg by mouth daily before breakfast.     HYDROcodone-acetaminophen (NORCO/VICODIN) 5-325 MG tablet Take 1 tablet by mouth every 6 (six) hours as needed  for moderate pain.     Magnesium Oxide -Mg Supplement 500 MG TABS Take 1 tablet by mouth at bedtime.     meclizine (ANTIVERT) 25 MG tablet Take 25 mg by mouth 3 (three) times daily as needed for dizziness.     ondansetron (ZOFRAN) 8 MG tablet Take 1 tablet (8 mg total) by mouth every 8 (eight) hours as needed for nausea or vomiting. Start on the third day after chemotherapy. 30 tablet 1   ONETOUCH ULTRA TEST test strip      oxybutynin (DITROPAN) 5 MG tablet Take 1 tablet by mouth 2 (two) times daily.     polyethylene glycol powder (GLYCOLAX/MIRALAX) 17 GM/SCOOP powder 1 cap full in a full glass of water, two times a day for 5 days. 255 g 0   potassium chloride SA (KLOR-CON M) 20 MEQ tablet Take 1 tablet (20 mEq total) by mouth daily. 30 tablet 1   prochlorperazine (COMPAZINE) 10 MG tablet Take 1 tablet (10 mg total) by mouth every 6 (six) hours as needed for nausea or vomiting. 30 tablet 1   rOPINIRole (REQUIP) 1 MG tablet Take 1 mg by mouth at bedtime.     sucralfate (CARAFATE) 1 g tablet Take 1 tablet (1 g total) by mouth 3 (three) times daily with meals. Dissolve tablet in 4 tablesppons warm water swish and swallow 30 min before meals. 90 tablet 0   warfarin (COUMADIN) 6 MG tablet Take 6 mg by mouth at bedtime.    ??    Problem list:  @IHPROBLICD @?    Past Medical History:  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, 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   CHF (congestive heart failure) (HCC)    followed colon resection, "wouldn't let me get up"   Chronic anticoagulation    Degenerative disc disease, lumbar    Diverticulitis 2018   Diverticulosis    DVT of lower extremity, bilateral (HCC) 2018   Essential hypertension    History of being hospitalized    1 month ago for 3 days, vomiting, and kink in upper intestine   History of hiatal hernia    Mediastinal adenopathy 04/2023   MRSA nasal colonization    Non-small cell lung cancer  (HCC) 2022   right   Obesity (BMI 30-39.9)    Osteoarthritis    Osteoporosis    PE (pulmonary thromboembolism) (HCC) 2018  Pneumonia    Small bowel obstruction (HCC) 03/20/2023   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HCC)    Type 2 diabetes mellitus with obesity (HCC)    Vertigo   ??    Past Surgical History:  Past Surgical History:  Procedure Laterality Date   APPENDECTOMY N/A 02/24/2017   Procedure: Incidental  APPENDECTOMY;  Surgeon: Ricarda Frame, MD;  Location: [name];  Service: General;  Laterality: N/A;   CARPAL TUNNEL RELEASE Bilateral    CATARACT EXTRACTION W/PHACO Left 11/21/2018   Procedure: CATARACT EXTRACTION PHACO AND INTRAOCULAR LENS PLACEMENT (IOC) LEFT DIABETES;  Surgeon: Galen Manila, MD;  Location: [facility];  Service: Ophthalmology;  Laterality: Left;  latex sensitivity Diabetic - oral meds   CATARACT EXTRACTION W/PHACO Right 12/12/2018   Procedure: CATARACT EXTRACTION PHACO AND INTRAOCULAR LENS PLACEMENT (IOC) RIGHT DIABETES;  Surgeon: Galen Manila, MD;  Location: [facility];  Service: Ophthalmology;  Laterality: Right;  Diabetes-oral med Latex sensitiviy   COLON RESECTION SIGMOID N/A 11/02/2016   Procedure: COLON RESECTION SIGMOID;  Surgeon: Ricarda Frame, MD;  Location: [name];  Service: General;  Laterality: N/A;   COLON SURGERY  11/02/2016   COLONOSCOPY WITH PROPOFOL N/A 02/03/2017   Procedure: COLONOSCOPY WITH PROPOFOL;  Surgeon: Toney Reil, MD;  Location: [name];  Service: Gastroenterology;  Laterality: N/A;   COLOSTOMY N/A 11/02/2016   Procedure: COLOSTOMY;  Surgeon: Ricarda Frame, MD;  Location: [name];  Service: General;  Laterality: N/A;   COLOSTOMY REVERSAL N/A 02/24/2017   Procedure: COLOSTOMY REVERSAL, ostomy takedown, spleenic flexure mobilization, excision rectal stump/distal sigmoid, anastomosis with suture reinforcement;  Surgeon: Ricarda Frame, MD;  Location: [name];  Service:  General;  Laterality: N/A;   ENDOBRONCHIAL ULTRASOUND N/A 05/16/2023   Procedure: ENDOBRONCHIAL ULTRASOUND;  Surgeon: [name], MD;  Location: [name];  Service: Cardiopulmonary;  Laterality: N/A;   FLEXIBLE BRONCHOSCOPY N/A 05/16/2023   Procedure: FLEXIBLE BRONCHOSCOPY;  Surgeon: [name], MD;  Location: [name];  Service: Cardiopulmonary;  Laterality: N/A;   ILEO LOOP DIVERSION N/A 02/24/2017   Procedure: ILEO LOOP COLOSTOMY;  Surgeon: Ricarda Frame, MD;  Location: [name];  Service: General;  Laterality: N/A;   ILEOSTOMY CLOSURE N/A 06/01/2017   Procedure: LOOP ILEOSTOMY TAKEDOWN;  Surgeon: Ricarda Frame, MD;  Location: [name];  Service: General;  Laterality: N/A;   IR IMAGING GUIDED PORT INSERTION  06/14/2023   IVC FILTER INSERTION Right 10/2016   IVC FILTER REMOVAL N/A 08/09/2017   Procedure: IVC FILTER REMOVAL;  Surgeon: Renford Dills, MD;  Location: [name];  Service: Cardiovascular;  Laterality: N/A;   KNEE SURGERY Right    torn menicus   LAPAROSCOPY N/A 02/24/2017   Procedure: LAPAROSCOPY DIAGNOSTIC;  Surgeon: Ricarda Frame, MD;  Location: [name];  Service: General;  Laterality: N/A;   LYSIS OF ADHESION N/A 02/24/2017   Procedure: LYSIS OF ADHESION;  Surgeon: Ricarda Frame, MD;  Location: [name];  Service: General;  Laterality: N/A;   PULMONARY VENOGRAPHY N/A 11/19/2016   Procedure: Pulmonary Venography; IVC filter placement; possible pulmonary thrombectomy;  Surgeon: Renford Dills, MD;  Location: [name];  Service: Cardiovascular;  Laterality: N/A;   SACROPLASTY N/A 11/08/2017   Procedure: SACROPLASTY S2;  Surgeon: Kennedy Bucker, MD;  Location: [name];  Service: Orthopedics;  Laterality: N/A;   SIGMOIDOSCOPY N/A 11/13/2016   Procedure: endoscopic  flexible SIGMOIDOSCOPY;  Surgeon: Lattie Haw, MD;  Location: [name];  Service: General;  Laterality: N/A;  SIGMOIDOSCOPY N/A 05/19/2017   Procedure:  FLEXIBLE SIGMOIDOSCOPY;  Surgeon: Ricarda Frame, MD;  Location: [name];  Service: General;  Laterality: N/A;   VIDEO BRONCHOSCOPY WITH ENDOBRONCHIAL NAVIGATION N/A 02/23/2021   Procedure: ROBOTIC VIDEO BRONCHOSCOPY WITH ENDOBRONCHIAL NAVIGATION;  Surgeon: [name], MD;  Location: [name];  Service: Pulmonary;  Laterality: N/A;  ??    Family History:  Family History  Problem Relation Age of Onset   Diabetes Mother    Heart disease Mother    Cancer Mother    Breast cancer Mother        >50   Heart disease Father    Diabetes Sister    Heart disease Sister   ?    Social History:  Social History   Tobacco Use   Smoking status: Never    Passive exposure: Current (husband smokes)   Smokeless tobacco: Never  Substance Use Topics   Alcohol use: No    Alcohol/week: 0.0 standard drinks of alcohol  ??    Allergies:  Allergies  Allergen Reactions   Adhesive [Tape] Other (See Comments)    Pt reports, when removing tape from skin most tapes pull her skin off. Ok to use paper tape   Morphine And Codeine Nausea And Vomiting   Other Hives    Chlorhexadine wipes/CHG wipes,    Latex Rash    Exam gloves/ does not react around elastic and lips don't swell when blowing balloons  ?    REVIEW OF SYSTEMS:???  ?  Review of Systems:?   Negative except for history     Current Facility-Administered Medications  Medication Dose Route Frequency Provider Last Rate Last Admin   0.9 %  sodium chloride infusion (Manually program via Guardrails IV Fluids)   Intravenous Once Verdene Lennert, MD       0.9 %  sodium chloride infusion  10 mL/hr Intravenous Once Jene Every, MD       insulin aspart (novoLOG) injection 0-15 Units  0-15 Units Subcutaneous TID WC Verdene Lennert, MD       lactated ringers infusion   Intravenous Continuous Verdene Lennert, MD       ondansetron (ZOFRAN) injection 4 mg  4 mg Intravenous Q6H PRN Verdene Lennert, MD       sodium chloride flush (NS) 0.9 %  injection 3 mL  3 mL Intravenous Q12H Verdene Lennert, MD   3 mL at 08/02/23 1427   Current Outpatient Medications  Medication Sig Dispense Refill   bisacodyl (DULCOLAX) 10 MG suppository Place 1 suppository (10 mg total) rectally as needed for moderate constipation. 12 suppository 0   calcium carbonate (CALCIUM 600) 600 MG TABS tablet Take 1 tablet (600 mg total) by mouth 2 (two) times daily with a meal. 60 tablet 5   candesartan (ATACAND) 16 MG tablet Take 16 mg by mouth in the morning.     cholecalciferol (VITAMIN D) 25 MCG (1000 UNIT) tablet Take 1,000 Units by mouth in the morning.     Cyanocobalamin (VITAMIN B-12) 5000 MCG TBDP Take 5,000 mcg by mouth in the morning.     cyclobenzaprine (FLEXERIL) 10 MG tablet Take 10 mg by mouth at bedtime.     dexamethasone (DECADRON) 4 MG tablet Take 2 tablets daily for 2 days, start the day after chemotherapy. Take with food. 30 tablet 1   Dulaglutide 1.5 MG/0.5ML SOPN Inject 1.5 mg into the skin every Saturday.     fluconazole (DIFLUCAN) 100 MG tablet Take 1 tablet (100 mg total)  by mouth daily. 10 tablet 0   gabapentin (NEURONTIN) 100 MG capsule Take 200 mg by mouth with breakfast, with lunch, and with evening meal.     gabapentin (NEURONTIN) 300 MG capsule Take 300 mg by mouth at bedtime.     glipiZIDE (GLUCOTROL) 10 MG tablet Take 10 mg by mouth daily before breakfast.     HYDROcodone-acetaminophen (NORCO/VICODIN) 5-325 MG tablet Take 1 tablet by mouth every 6 (six) hours as needed for moderate pain.     Magnesium Oxide -Mg Supplement 500 MG TABS Take 1 tablet by mouth at bedtime.     meclizine (ANTIVERT) 25 MG tablet Take 25 mg by mouth 3 (three) times daily as needed for dizziness.     ondansetron (ZOFRAN) 8 MG tablet Take 1 tablet (8 mg total) by mouth every 8 (eight) hours as needed for nausea or vomiting. Start on the third day after chemotherapy. 30 tablet 1   ONETOUCH ULTRA TEST test strip      oxybutynin (DITROPAN) 5 MG tablet Take 1 tablet  by mouth 2 (two) times daily.     polyethylene glycol powder (GLYCOLAX/MIRALAX) 17 GM/SCOOP powder 1 cap full in a full glass of water, two times a day for 5 days. 255 g 0   potassium chloride SA (KLOR-CON M) 20 MEQ tablet Take 1 tablet (20 mEq total) by mouth daily. 30 tablet 1   prochlorperazine (COMPAZINE) 10 MG tablet Take 1 tablet (10 mg total) by mouth every 6 (six) hours as needed for nausea or vomiting. 30 tablet 1   rOPINIRole (REQUIP) 1 MG tablet Take 1 mg by mouth at bedtime.     sucralfate (CARAFATE) 1 g tablet Take 1 tablet (1 g total) by mouth 3 (three) times daily with meals. Dissolve tablet in 4 tablesppons warm water swish and swallow 30 min before meals. 90 tablet 0   warfarin (COUMADIN) 6 MG tablet Take 6 mg by mouth at bedtime.     Facility-Administered Medications Ordered in Other Encounters  Medication Dose Route Frequency Provider Last Rate Last Admin   0.9 %  sodium chloride infusion   Intravenous Continuous Creig Hines, MD 10 mL/hr at 07/15/23 1012 New Bag at 07/15/23 1012      PHYSICAL EXAM:?    Patient Vitals for the past 8 hrs:  BP Temp Temp src Pulse Resp SpO2  08/02/23 1500 (!) 104/58 97.6 F (36.4 C) Axillary 83 18 100 %  08/02/23 1445 (!) 86/50 97.6 F (36.4 C) Axillary 82 15 100 %  08/02/23 1441 -- -- -- 75 (!) 23 100 %  08/02/23 1430 (!) 107/52 -- -- 87 (!) 22 98 %  08/02/23 1427 (!) 89/54 -- -- -- (!) 26 --  08/02/23 1425 (!) 71/39 97.6 F (36.4 C) Axillary 87 20 --  08/02/23 1418 (!) 71/39 -- -- -- 12 --  08/02/23 1338 (!) 79/54 97.8 F (36.6 C) Oral 90 18 100 %  08/02/23 1337 -- -- -- 91 16 95 %  08/02/23 1332 (!) 85/60 -- -- -- 20 --  08/02/23 1326 107/63 97.7 F (36.5 C) -- 94 19 100 %  08/02/23 1323 107/63 97.7 F (36.5 C) -- 94 19 --  08/02/23 1305 (!) 95/49 97.7 F (36.5 C) -- -- 19 --  08/02/23 1210 (!) 89/53 -- -- -- 20 --  08/02/23 1127 (!) 84/50 -- -- -- -- --  08/02/23 1124 (!) 79/54 (!) 97.5 F (36.4 C) Oral 89 (!) 21 98 %  08/02/23 1106 (!) 64/41 -- -- -- -- --  ?  There is no height or weight on file to calculate BMI.    ?  General appearance: alert, cooperative, appears stated age, no distress, and in no distress?  Head: Normocephalic, without obvious abnormality, atraumatic Mucous membranes tacky.?  Eyes: Anicteric, symmetric Neck: no adenopathy, no JVD, and supple, symmetrical, trachea midlinewhile seated upright.?  Chest:  symmetric inspiration/expiration, no labored breathing ?  Cardiac:  no JVD Abdomen:  soft non tender  , dried blood on rectal exam. No new bleeding Genitourinary: defered Neuro: Grossly normal?  Psych: normal affect  Skin: normal  Musculoskeletal: no deformity  Vascular: palpable distal pulses   Extremities:  warm well perfused ?        Assessment:   Nyja Westbrook is a 73 y.o. yo adult with new onset lower GI bleeding, with anemia.   Currently, she is hemodynamically stable/unstable.    I had a discussion with the patient and family regarding the contemporary concepts in the management of acute GI bleeding, including the historical surgical approach role (high reported rates of morbidity & mortality), medical management, need for source-identification, and logistics of image-guided angiogram and embolization.      Risks of angiogram and embolization were discussed, including bleeding, infection, injury to vascular structures, contrast reaction, organ injury including kidneys, organ ischemia, non-target embolization, need for additional surgery/procedure including repeat angiogram or other diagnostics, sedation risk, cardiopulmonary collapse, death.     After our discussion, she is good with our plan to observe.     Consent is signed and on the chart.  VIR on call is aware and if needed we can be available for reassessment and possible angiogram.    Principal Problem:   Hemorrhagic shock (HCC) Active Problems:   Type 2 diabetes mellitus with obesity (HCC)    Essential hypertension   History of pulmonary embolism   Supratherapeutic INR   Chronic diastolic (congestive) heart failure (HCC)   Recurrent non-small cell lung cancer (NSCLC) (HCC)   Acute GI bleeding   Lower GI bleed ?    Lower GI bleeding, source in the small bowel anastamosis site, localized by CTA/colonoscopy  - she is stabilizing, and observation is reasonable.   - drawbacks to embo of the small bowel are that the region is more sensitive to embo effects, possible ischemia.    Anemia, acute blood loss  - resuscitation/transfusion   - trend H&H       Plan: - reasonable to observe at this point - trend H&H - if any status changes, please page VIR on call, aware of case.   - a repeat CTA might be necessary if any overnight changes, given that this site of bleeding appears to be small bowel, which is more sensitive to embolization.    Thank you for this interesting consult.  I greatly enjoyed meeting [employer] and look forward to participating in their care.  A copy of this report was sent to the requesting provider on this date.  Electronically Signed: Gilmer Mor, DO 08/02/2023, 3:44 PM   I spent a total of 55 Miinutes    in face to face in clinical consultation, greater than 50% of which was counseling/coordinating care for acute lower GI bleeding, possible angio/embolization.

## 2023-08-02 NOTE — Assessment & Plan Note (Signed)
 Patient presented with large-volume bright red blood per rectum, with CTA demonstrating venous phase intraluminal extravasation at the distal rectum, most suspicious for internal hemorrhoidal hemorrhage.  Suspect that severity of bleed is due to supratherapeutic INR.  - GI consulted; appreciate their recommendations - Pending warfarin reversal, will consult IR for consideration of embolization if patient continues to be hemodynamically unstable

## 2023-08-02 NOTE — ED Provider Notes (Signed)
 [facility] Provider Note    Event Date/Time   First MD Initiated Contact with Patient 08/02/23 1106     (approximate)   History   Rectal Bleeding   HPI  Kara [name] is a 73 y.o. adult with a history of recurrent lung cancer undergoing chemotherapy and radiation, on Coumadin who presents with rectal bleeding.  Patient reports this morning she felt that she had have a bowel movement and passed only blood, she reports this happened 3 times.  She reports she feels weak     Physical Exam   Triage Vital Signs: ED Triage Vitals  Encounter Vitals Group     BP 08/02/23 1106 (!) 64/41     Systolic BP Percentile --      Diastolic BP Percentile --      Pulse --      Resp --      Temp --      Temp src --      SpO2 --      Weight --      Height --      Head Circumference --      Peak Flow --      Pain Score 08/02/23 1104 6     Pain Loc --      Pain Education --      Exclude from Growth Chart --     Most recent vital signs: Vitals:   08/02/23 1127 08/02/23 1210  BP: (!) 84/50 (!) 89/53  Pulse:    Resp:  20  Temp:    SpO2:       General: Awake, no distress.  CV:  Good peripheral perfusion.  Resp:  Normal effort.  Abd:  No distention.  Soft, nontender Other:     ED Results / Procedures / Treatments   Labs (all labs ordered are listed, but only abnormal results are displayed) Labs Reviewed  CBC - Abnormal; Notable for the following components:      Result Value   RBC 3.79 (*)    Hemoglobin 10.4 (*)    HCT 32.2 (*)    RDW 18.6 (*)    Platelets 142 (*)    nRBC 0.7 (*)    All other components within normal limits  COMPREHENSIVE METABOLIC PANEL - Abnormal; Notable for the following components:   Sodium 131 (*)    CO2 21 (*)    Glucose, Bld 409 (*)    BUN 31 (*)    Creatinine, Ser 1.05 (*)    Calcium 8.2 (*)    Total Protein 5.5 (*)    Albumin 2.9 (*)    GFR, Estimated 57 (*)    All other components within normal limits   PROTIME-INR - Abnormal; Notable for the following components:   Prothrombin Time 78.6 (*)    INR 9.8 (*)    All other components within normal limits  APTT - Abnormal; Notable for the following components:   aPTT 49 (*)    All other components within normal limits  TYPE AND SCREEN  PREPARE RBC (CROSSMATCH)  ABO/RH     EKG     RADIOLOGY Ct angio pending    PROCEDURES:  Critical Care performed: yes  CRITICAL CARE Performed by: Jene Every   Total critical care time: 30 minutes  Critical care time was exclusive of separately billable procedures and treating other patients.  Critical care was necessary to treat or prevent imminent or life-threatening deterioration.  Critical care was time  spent personally by me on the following activities: development of treatment plan with patient and/or surrogate as well as nursing, discussions with consultants, evaluation of patient's response to treatment, examination of patient, obtaining history from patient or surrogate, ordering and performing treatments and interventions, ordering and review of laboratory studies, ordering and review of radiographic studies, pulse oximetry and re-evaluation of patient's condition.   Procedures   MEDICATIONS ORDERED IN ED: Medications  0.9 %  sodium chloride infusion (has no administration in time range)  prothrombin complex conc human (KCENTRA) IVPB 1,566 Units (has no administration in time range)  phytonadione (VITAMIN K) 10 mg in dextrose 5 % 50 mL IVPB (has no administration in time range)  0.9 %  sodium chloride infusion (0 mLs Intravenous Stopped 08/02/23 1211)  iohexol (OMNIPAQUE) 350 MG/ML injection 100 mL (100 mLs Intravenous Contrast Given 08/02/23 1232)     IMPRESSION / MDM / ASSESSMENT AND PLAN / ED COURSE  I reviewed the triage vital signs and the nursing notes. Patient's presentation is most consistent with acute presentation with potential threat to life or bodily  function.  Patient presents with rectal bleeding as detailed above, she is on Coumadin.  Her blood pressure is 64/41, certainly the concern is this is from acute blood loss.  I have ordered emergency packed red blood cells 2 units.,  IV fluids ordered.  Labs are pending  Patient's hemoglobin is 10.4 and her blood pressure is improving with IV fluids, I do still have concern about potentially brisk GI bleeding, so we will continue PRBCs and reverse INR given INR is greater than 9  I consulted Dr. Servando Snare of GI, he will see the patient  Have consulted the hospitalist team for admission      FINAL CLINICAL IMPRESSION(S) / ED DIAGNOSES   Final diagnoses:  Lower GI bleed     Rx / DC Orders   ED Discharge Orders     None        Note:  This document was prepared using Dragon voice recognition software and may include unintentional dictation errors.   Jene Every, MD 08/02/23 1247

## 2023-08-02 NOTE — Assessment & Plan Note (Signed)
 History of HFpEF with last EF within normal limits back in 2018.  She is not on diuretics at home. Patient appears hypovolemic at this time.

## 2023-08-02 NOTE — ED Notes (Signed)
 This NT changed patient. Patient had bloody stool. Patient has been cleaned up. Patient has on a clean chux and a clean brief. Patient is resting comfortably at this time.

## 2023-08-02 NOTE — Assessment & Plan Note (Signed)
 Unclear etiology for patient's INR becoming so high at 9.8, and unfortunately she has taken her home warfarin today.  - S/p vitamin K and Kcentra - Repeat PT/INR pending - Pharmacy consultation for post Kcentra monitoring

## 2023-08-02 NOTE — Assessment & Plan Note (Signed)
 Last A1c of 7.0%  - SSI, moderate - Hold home regimen

## 2023-08-02 NOTE — ED Triage Notes (Signed)
 Pt from home via [facility] with reports of 3 bright red bowel movements that started at 0530 this morning. Pt takes warfarin. Pts initial BP 68/34. Received NaCl en route. Current bp 98/50.

## 2023-08-02 NOTE — Assessment & Plan Note (Addendum)
 Distant history of PE and DVT in 2018 in 2019, on warfarin since then.  Holding at this time given supratherapeutic INR  - May benefit from risk/benefit discussion regarding warfarin, and consideration of Eliquis/Xarelto

## 2023-08-02 NOTE — Consult Note (Signed)
 Midge Minium, MD [facility]  [address] [place], [place] [address] Phone: [phone] Fax : [phone]  Consultation  Referring Provider:     No ref. provider found Primary Care Physician:  Barbette Reichmann, MD Primary Gastroenterologist: [name]         Reason for Consultation:     Lower GI bleeding  Date of Admission:  08/02/2023 Date of Consultation:  08/02/2023         HPI:   Kara [name] is a 73 y.o. adult with a history of lung cancer and receiving both chemotherapy and radiation.  The patient has been having some problems with swallowing and has a history of diverticulitis with a partial colectomy.  The patient had an attempted colonoscopy through the ostomy site by Dr. Allegra Lai in September 2018 with stenosis making it unable to pass the scope.  The patient has had no further procedures since then to evaluate the colon.  She now comes in with rectal bleeding that started yesterday.  Prior to coming to the hospital she had been having some passage of some bright red blood and she reports that it happened 3 times.  She also reports that she has had intermittent bleeding but not to this extent in the past.  She is on Coumadin and was found to have an INR of  9.8.  The patient's hemoglobin 4 days ago was 11.2 with a repeat today being 10.4. The patient was sent down for a stat CT angio which has been done but has not been read yet.  The patient arrived to the hospital with hypotension with systolic blood pressures of 64 with a diastolic of 41.  The patient has been ordered blood and is being transfused with 1 unit when I went to see her.  She is also being treated to reverse her Coumadin.  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, 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   CHF (congestive heart failure) (HCC)    followed colon resection, "wouldn't let me get up"   Chronic anticoagulation    Degenerative disc  disease, lumbar    Diverticulitis 2018   Diverticulosis    DVT of lower extremity, bilateral (HCC) 2018   Essential hypertension    History of being hospitalized    1 month ago for 3 days, vomiting, and kink in upper intestine   History of hiatal hernia    Mediastinal adenopathy 04/2023   MRSA nasal colonization    Non-small cell lung cancer (HCC) 2022   right   Obesity (BMI 30-39.9)    Osteoarthritis    Osteoporosis    PE (pulmonary thromboembolism) (HCC) 2018   Pneumonia    Small bowel obstruction (HCC) 03/20/2023   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HCC)    Type 2 diabetes mellitus with obesity (HCC)    Vertigo     Past Surgical History:  Procedure Laterality Date   APPENDECTOMY N/A 02/24/2017   Procedure: Incidental  APPENDECTOMY;  Surgeon: Ricarda Frame, MD;  Location: [name];  Service: General;  Laterality: N/A;   CARPAL TUNNEL RELEASE Bilateral    CATARACT EXTRACTION W/PHACO Left 11/21/2018   Procedure: CATARACT EXTRACTION PHACO AND INTRAOCULAR LENS PLACEMENT (IOC) LEFT DIABETES;  Surgeon: Galen Manila, MD;  Location: [facility];  Service: Ophthalmology;  Laterality: Left;  latex sensitivity Diabetic - oral meds   CATARACT EXTRACTION W/PHACO Right 12/12/2018  Procedure: CATARACT EXTRACTION PHACO AND INTRAOCULAR LENS PLACEMENT (IOC) RIGHT DIABETES;  Surgeon: Galen Manila, MD;  Location: [facility];  Service: Ophthalmology;  Laterality: Right;  Diabetes-oral med Latex sensitiviy   COLON RESECTION SIGMOID N/A 11/02/2016   Procedure: COLON RESECTION SIGMOID;  Surgeon: Ricarda Frame, MD;  Location: [name];  Service: General;  Laterality: N/A;   COLON SURGERY  11/02/2016   COLONOSCOPY WITH PROPOFOL N/A 02/03/2017   Procedure: COLONOSCOPY WITH PROPOFOL;  Surgeon: Toney Reil, MD;  Location: [name];  Service: Gastroenterology;  Laterality: N/A;   COLOSTOMY N/A 11/02/2016   Procedure: COLOSTOMY;  Surgeon: Ricarda Frame,  MD;  Location: [name];  Service: General;  Laterality: N/A;   COLOSTOMY REVERSAL N/A 02/24/2017   Procedure: COLOSTOMY REVERSAL, ostomy takedown, spleenic flexure mobilization, excision rectal stump/distal sigmoid, anastomosis with suture reinforcement;  Surgeon: Ricarda Frame, MD;  Location: [name];  Service: General;  Laterality: N/A;   ENDOBRONCHIAL ULTRASOUND N/A 05/16/2023   Procedure: ENDOBRONCHIAL ULTRASOUND;  Surgeon: [name], MD;  Location: [name];  Service: Cardiopulmonary;  Laterality: N/A;   FLEXIBLE BRONCHOSCOPY N/A 05/16/2023   Procedure: FLEXIBLE BRONCHOSCOPY;  Surgeon: [name], MD;  Location: [name];  Service: Cardiopulmonary;  Laterality: N/A;   ILEO LOOP DIVERSION N/A 02/24/2017   Procedure: ILEO LOOP COLOSTOMY;  Surgeon: Ricarda Frame, MD;  Location: [name];  Service: General;  Laterality: N/A;   ILEOSTOMY CLOSURE N/A 06/01/2017   Procedure: LOOP ILEOSTOMY TAKEDOWN;  Surgeon: Ricarda Frame, MD;  Location: [name];  Service: General;  Laterality: N/A;   IR IMAGING GUIDED PORT INSERTION  06/14/2023   IVC FILTER INSERTION Right 10/2016   IVC FILTER REMOVAL N/A 08/09/2017   Procedure: IVC FILTER REMOVAL;  Surgeon: Renford Dills, MD;  Location: [name];  Service: Cardiovascular;  Laterality: N/A;   KNEE SURGERY Right    torn menicus   LAPAROSCOPY N/A 02/24/2017   Procedure: LAPAROSCOPY DIAGNOSTIC;  Surgeon: Ricarda Frame, MD;  Location: [name];  Service: General;  Laterality: N/A;   LYSIS OF ADHESION N/A 02/24/2017   Procedure: LYSIS OF ADHESION;  Surgeon: Ricarda Frame, MD;  Location: [name];  Service: General;  Laterality: N/A;   PULMONARY VENOGRAPHY N/A 11/19/2016   Procedure: Pulmonary Venography; IVC filter placement; possible pulmonary thrombectomy;  Surgeon: Renford Dills, MD;  Location: [name];  Service: Cardiovascular;  Laterality: N/A;   SACROPLASTY N/A 11/08/2017   Procedure:  SACROPLASTY S2;  Surgeon: Kennedy Bucker, MD;  Location: [name];  Service: Orthopedics;  Laterality: N/A;   SIGMOIDOSCOPY N/A 11/13/2016   Procedure: endoscopic  flexible SIGMOIDOSCOPY;  Surgeon: Lattie Haw, MD;  Location: [name];  Service: General;  Laterality: N/A;   SIGMOIDOSCOPY N/A 05/19/2017   Procedure: Arnell Sieving;  Surgeon: Ricarda Frame, MD;  Location: [name];  Service: General;  Laterality: N/A;   VIDEO BRONCHOSCOPY WITH ENDOBRONCHIAL NAVIGATION N/A 02/23/2021   Procedure: ROBOTIC VIDEO BRONCHOSCOPY WITH ENDOBRONCHIAL NAVIGATION;  Surgeon: [name], MD;  Location: [name];  Service: Pulmonary;  Laterality: N/A;    Prior to Admission medications   Medication Sig Start Date End Date Taking? Authorizing Provider  bisacodyl (DULCOLAX) 10 MG suppository Place 1 suppository (10 mg total) rectally as needed for moderate constipation. 07/06/23   Sharman Cheek, MD  calcium carbonate (CALCIUM 600) 600 MG TABS tablet Take 1 tablet (600 mg total) by mouth 2 (two) times daily with a meal. 02/25/20 07/12/23  Delano Metz, MD  candesartan (  ATACAND) 16 MG tablet Take 16 mg by mouth in the morning. 08/03/17   [provider]  cholecalciferol (VITAMIN D) 25 MCG (1000 UNIT) tablet Take 1,000 Units by mouth in the morning.    [provider]  Cyanocobalamin (VITAMIN B-12) 5000 MCG TBDP Take 5,000 mcg by mouth in the morning.    [provider]  cyclobenzaprine (FLEXERIL) 10 MG tablet Take 10 mg by mouth at bedtime. 07/05/16   [provider]  dexamethasone (DECADRON) 4 MG tablet Take 2 tablets daily for 2 days, start the day after chemotherapy. Take with food. 05/27/23   Michaelyn Barter, MD  Dulaglutide 1.5 MG/0.5ML SOPN Inject 1.5 mg into the skin every Saturday.    [provider]  fluconazole (DIFLUCAN) 100 MG tablet Take 1 tablet (100 mg total) by mouth daily. 07/26/23   Carmina Miller, MD  gabapentin (NEURONTIN) 100  MG capsule Take 200 mg by mouth with breakfast, with lunch, and with evening meal.    [provider]  gabapentin (NEURONTIN) 300 MG capsule Take 300 mg by mouth at bedtime.    [provider]  glipiZIDE (GLUCOTROL) 10 MG tablet Take 10 mg by mouth daily before breakfast.    [provider]  HYDROcodone-acetaminophen (NORCO/VICODIN) 5-325 MG tablet Take 1 tablet by mouth every 6 (six) hours as needed for moderate pain.    [provider]  Magnesium Oxide -Mg Supplement 500 MG TABS Take 1 tablet by mouth at bedtime.    [provider]  meclizine (ANTIVERT) 25 MG tablet Take 25 mg by mouth 3 (three) times daily as needed for dizziness. 12/29/17   [provider]  ondansetron (ZOFRAN) 8 MG tablet Take 1 tablet (8 mg total) by mouth every 8 (eight) hours as needed for nausea or vomiting. Start on the third day after chemotherapy. 05/27/23   Michaelyn Barter, MD  [facility] ULTRA TEST test strip  01/25/23   [provider]  oxybutynin (DITROPAN) 5 MG tablet Take 1 tablet by mouth 2 (two) times daily. 02/25/22   [provider]  polyethylene glycol powder (GLYCOLAX/MIRALAX) 17 GM/SCOOP powder 1 cap full in a full glass of water, two times a day for 5 days. 07/06/23   Sharman Cheek, MD  potassium chloride SA (KLOR-CON M) 20 MEQ tablet Take 1 tablet (20 mEq total) by mouth daily. 07/22/23   Creig Hines, MD  prochlorperazine (COMPAZINE) 10 MG tablet Take 1 tablet (10 mg total) by mouth every 6 (six) hours as needed for nausea or vomiting. 05/27/23   Michaelyn Barter, MD  rOPINIRole (REQUIP) 1 MG tablet Take 1 mg by mouth at bedtime. 06/22/23   [provider]  sucralfate (CARAFATE) 1 g tablet Take 1 tablet (1 g total) by mouth 3 (three) times daily with meals. Dissolve tablet in 4 tablesppons warm water swish and swallow 30 min before meals. 07/22/23 08/21/23  Carmina Miller, MD  warfarin (COUMADIN) 6 MG tablet Take 6 mg by mouth at bedtime.  06/01/23   [provider]    Family History  Problem Relation Age of Onset   Diabetes Mother    Heart disease Mother    Cancer Mother    Breast cancer Mother        >50   Heart disease Father    Diabetes Sister    Heart disease Sister      Social History   Tobacco Use   Smoking status: Never    Passive exposure: Current (husband smokes)  Smokeless tobacco: Never  Vaping Use   Vaping status: Never Used  Substance Use Topics   Alcohol use: No    Alcohol/week: 0.0 standard drinks of alcohol   Drug use: No    Allergies as of 08/02/2023 - Review Complete 08/02/2023  Allergen Reaction Noted   Adhesive [tape] Other (See Comments) 02/17/2017   Morphine and codeine Nausea And Vomiting 12/27/2016   Other Hives 02/17/2017   Latex Rash 10/03/2016    Review of Systems:    All systems reviewed and negative except where noted in HPI.   Physical Exam:  Vital signs in last 24 hours: Temp:  [97.5 F (36.4 C)-97.8 F (36.6 C)] 97.8 F (36.6 C) (03/11 1338) Pulse Rate:  [89-94] 90 (03/11 1338) Resp:  [16-21] 18 (03/11 1338) BP: (64-107)/(41-63) 79/54 (03/11 1338) SpO2:  [95 %-100 %] 100 % (03/11 1338)   General:   Pleasant, cooperative in NAD Head:  Normocephalic and atraumatic. Eyes:   No icterus.   Conjunctiva pink. PERRLA. Ears:  Normal auditory acuity. Neck:  Supple; no masses or thyroidomegaly Lungs: Respirations even and unlabored. Lungs clear to auscultation bilaterally.   No wheezes, crackles, or rhonchi.  Heart:  Regular rate and rhythm;  Without murmur, clicks, rubs or gallops Abdomen:  Soft, nondistended, nontender. Normal bowel sounds. No appreciable masses or hepatomegaly.  No rebound or guarding.  Rectal:  Not performed. Msk:  Symmetrical without gross deformities.   Extremities:  Without edema, cyanosis or clubbing. Neurologic:  Alert and oriented x3;  grossly normal neurologically. Skin:  Intact without significant lesions or rashes. Cervical  Nodes:  No significant cervical adenopathy. Psych:  Alert and cooperative. Normal affect.  LAB RESULTS: Recent Labs    08/02/23 1108  WBC 4.6  HGB 10.4*  HCT 32.2*  PLT 142*   BMET Recent Labs    08/02/23 1108  NA 131*  K 3.9  CL 101  CO2 21*  GLUCOSE 409*  BUN 31*  CREATININE 1.05*  CALCIUM 8.2*   LFT Recent Labs    08/02/23 1108  PROT 5.5*  ALBUMIN 2.9*  AST 18  ALT 27  ALKPHOS 48  BILITOT 0.7   PT/INR Recent Labs    08/02/23 1108  LABPROT 78.6*  INR 9.8*    STUDIES: No results found.    Impression / Plan:   Assessment: Principal Problem:   Hemorrhagic shock (HCC) Active Problems:   Type 2 diabetes mellitus with obesity (HCC)   Essential hypertension   History of pulmonary embolism   Supratherapeutic INR   Chronic diastolic (congestive) heart failure (HCC)   Recurrent non-small cell lung cancer (NSCLC) (HCC)   Acute GI bleeding   Kara [name] is a 73 y.o. y/o adult with rectal bleeding and a history of diverticulosis with diverticulitis with colonic resection.  The patient has been sent down for CT angiography and those results are pending.  The patient is not cleaned out for a colonoscopy at this time and is being transfused with blood and having her high INR addressed.  Plan:  The patient's bleeding can be from a diverticular bleed versus a malignancy versus hemorrhoids versus colonic ulceration.  I will await the CT angiography results before planning on any further intervention.  The patient should be transfused as necessary.  Interventional radiology versus vascular surgery for possible embolization should be considered if the patient should continue to bleed at such a high rate.  A colonoscopy is not feasible at this time due to the patient  not being prepped and her INR being dangerously high.  The patient has been explained the plan and agrees with it.  Thank you for involving me in the care of this patient.      LOS: 0 days    Midge Minium, MD, MD. Clementeen Graham 08/02/2023, 1:59 PM,  Pager [phone] 7am-5pm  Check AMION for 5pm -7am coverage and on weekends   Note: This dictation was prepared with Dragon dictation along with smaller phrase technology. Any transcriptional errors that result from this process are unintentional.

## 2023-08-02 NOTE — H&P (Signed)
 History and Physical    Patient: Kara [name] ZOX:[id number] DOB: 12-Feb-1951 DOA: 08/02/2023 DOS: the patient was seen and examined on 08/02/2023 PCP: Barbette Reichmann, MD  Patient coming from: Home  Chief Complaint:  Chief Complaint  Patient presents with   Rectal Bleeding   HPI: Kara [name] is a 73 y.o. 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  Patient states that she awoke today at 5 AM due to sudden onset bright red blood per rectum with 3 episodes of large-volume.  She states that she has seen bright red blood in her stool before but never this quantity for this severe.  She denies any dizziness or shortness of breath that is new but notes that she had profound generalized weakness earlier.  She denies any chest pain or abdominal pain.  She denies any melena, hematemesis.  She notes that she had her INR checked last week and was told to take warfarin 5 mg yesterday and the day prior, before returning to 6.5 mg today.  She has already taken her home warfarin for today.  ED course: On arrival to the ED, patient was hypotensive at 64/41 with heart rate of 89.  She was saturating at 98% on room air.  She was mildly hypothermic at 97.5.  Initial workup notable for hemoglobin of 10.4, platelets 142, glucose 409, BUN 31, creatinine 0.05, and GFR 57.  INR markedly elevated at 9.8.  GI consulted and CTA obtained with results pending.  1 unit of packed RBC ordered and patient started on Kcentra and vitamin K.  TRH contacted for admission.   Review of Systems: As mentioned in the history of present illness. All other systems reviewed and are negative.  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, last injection was in Aug  2018   CHF (congestive heart failure) (HCC)    followed colon resection, "wouldn't let me get up"   Chronic anticoagulation    Degenerative disc disease, lumbar    Diverticulitis 2018   Diverticulosis    DVT of lower extremity, bilateral (HCC) 2018   Essential hypertension    History of being hospitalized    1 month ago for 3 days, vomiting, and kink in upper intestine   History of hiatal hernia    Mediastinal adenopathy 04/2023   MRSA nasal colonization    Non-small cell lung cancer (HCC) 2022   right   Obesity (BMI 30-39.9)    Osteoarthritis    Osteoporosis    PE (pulmonary thromboembolism) (HCC) 2018   Pneumonia    Small bowel obstruction (HCC) 03/20/2023   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HCC)    Type 2 diabetes mellitus with obesity (HCC)    Vertigo    Past Surgical History:  Procedure Laterality Date   APPENDECTOMY N/A 02/24/2017   Procedure: Incidental  APPENDECTOMY;  Surgeon: Ricarda Frame, MD;  Location: [name];  Service: General;  Laterality: N/A;   CARPAL TUNNEL RELEASE Bilateral    CATARACT EXTRACTION W/PHACO Left 11/21/2018   Procedure: CATARACT EXTRACTION PHACO AND INTRAOCULAR LENS PLACEMENT (IOC) LEFT DIABETES;  Surgeon: Galen Manila, MD;  Location: [facility];  Service: Ophthalmology;  Laterality: Left;  latex sensitivity Diabetic - oral meds   CATARACT EXTRACTION W/PHACO  Right 12/12/2018   Procedure: CATARACT EXTRACTION PHACO AND INTRAOCULAR LENS PLACEMENT (IOC) RIGHT DIABETES;  Surgeon: Galen Manila, MD;  Location: [facility];  Service: Ophthalmology;  Laterality: Right;  Diabetes-oral med Latex sensitiviy   COLON RESECTION SIGMOID N/A 11/02/2016   Procedure: COLON RESECTION SIGMOID;  Surgeon: Ricarda Frame, MD;  Location: [name];  Service: General;  Laterality: N/A;   COLON SURGERY  11/02/2016   COLONOSCOPY WITH PROPOFOL N/A 02/03/2017   Procedure: COLONOSCOPY WITH PROPOFOL;  Surgeon: Toney Reil, MD;   Location: [name];  Service: Gastroenterology;  Laterality: N/A;   COLOSTOMY N/A 11/02/2016   Procedure: COLOSTOMY;  Surgeon: Ricarda Frame, MD;  Location: [name];  Service: General;  Laterality: N/A;   COLOSTOMY REVERSAL N/A 02/24/2017   Procedure: COLOSTOMY REVERSAL, ostomy takedown, spleenic flexure mobilization, excision rectal stump/distal sigmoid, anastomosis with suture reinforcement;  Surgeon: Ricarda Frame, MD;  Location: [name];  Service: General;  Laterality: N/A;   ENDOBRONCHIAL ULTRASOUND N/A 05/16/2023   Procedure: ENDOBRONCHIAL ULTRASOUND;  Surgeon: [name], MD;  Location: [name];  Service: Cardiopulmonary;  Laterality: N/A;   FLEXIBLE BRONCHOSCOPY N/A 05/16/2023   Procedure: FLEXIBLE BRONCHOSCOPY;  Surgeon: [name], MD;  Location: [name];  Service: Cardiopulmonary;  Laterality: N/A;   ILEO LOOP DIVERSION N/A 02/24/2017   Procedure: ILEO LOOP COLOSTOMY;  Surgeon: Ricarda Frame, MD;  Location: [name];  Service: General;  Laterality: N/A;   ILEOSTOMY CLOSURE N/A 06/01/2017   Procedure: LOOP ILEOSTOMY TAKEDOWN;  Surgeon: Ricarda Frame, MD;  Location: [name];  Service: General;  Laterality: N/A;   IR IMAGING GUIDED PORT INSERTION  06/14/2023   IVC FILTER INSERTION Right 10/2016   IVC FILTER REMOVAL N/A 08/09/2017   Procedure: IVC FILTER REMOVAL;  Surgeon: Renford Dills, MD;  Location: [name];  Service: Cardiovascular;  Laterality: N/A;   KNEE SURGERY Right    torn menicus   LAPAROSCOPY N/A 02/24/2017   Procedure: LAPAROSCOPY DIAGNOSTIC;  Surgeon: Ricarda Frame, MD;  Location: [name];  Service: General;  Laterality: N/A;   LYSIS OF ADHESION N/A 02/24/2017   Procedure: LYSIS OF ADHESION;  Surgeon: Ricarda Frame, MD;  Location: [name];  Service: General;  Laterality: N/A;   PULMONARY VENOGRAPHY N/A 11/19/2016   Procedure: Pulmonary Venography; IVC filter placement; possible pulmonary thrombectomy;   Surgeon: Renford Dills, MD;  Location: [name];  Service: Cardiovascular;  Laterality: N/A;   SACROPLASTY N/A 11/08/2017   Procedure: SACROPLASTY S2;  Surgeon: Kennedy Bucker, MD;  Location: [name];  Service: Orthopedics;  Laterality: N/A;   SIGMOIDOSCOPY N/A 11/13/2016   Procedure: endoscopic  flexible SIGMOIDOSCOPY;  Surgeon: Lattie Haw, MD;  Location: [name];  Service: General;  Laterality: N/A;   SIGMOIDOSCOPY N/A 05/19/2017   Procedure: Arnell Sieving;  Surgeon: Ricarda Frame, MD;  Location: [name];  Service: General;  Laterality: N/A;   VIDEO BRONCHOSCOPY WITH ENDOBRONCHIAL NAVIGATION N/A 02/23/2021   Procedure: ROBOTIC VIDEO BRONCHOSCOPY WITH ENDOBRONCHIAL NAVIGATION;  Surgeon: [name], MD;  Location: [name];  Service: Pulmonary;  Laterality: N/A;   Social History:  reports that she has never smoked. She has been exposed to tobacco smoke. She has never used smokeless tobacco. She reports that she does not drink alcohol and does not use drugs.  Allergies  Allergen Reactions   Adhesive [Tape] Other (See Comments)    Pt reports, when removing tape from skin most tapes pull her skin off. Ok to use paper tape  Morphine And Codeine Nausea And Vomiting   Other Hives    Chlorhexadine wipes/CHG wipes,    Latex Rash    Exam gloves/ does not react around elastic and lips don't swell when blowing balloons    Family History  Problem Relation Age of Onset   Diabetes Mother    Heart disease Mother    Cancer Mother    Breast cancer Mother        >50   Heart disease Father    Diabetes Sister    Heart disease Sister     Prior to Admission medications   Medication Sig Start Date End Date Taking? Authorizing Provider  bisacodyl (DULCOLAX) 10 MG suppository Place 1 suppository (10 mg total) rectally as needed for moderate constipation. 07/06/23   Sharman Cheek, MD  calcium carbonate (CALCIUM 600) 600 MG TABS tablet Take 1 tablet (600  mg total) by mouth 2 (two) times daily with a meal. 02/25/20 07/12/23  Delano Metz, MD  candesartan (ATACAND) 16 MG tablet Take 16 mg by mouth in the morning. 08/03/17   [provider]  cholecalciferol (VITAMIN D) 25 MCG (1000 UNIT) tablet Take 1,000 Units by mouth in the morning.    [provider]  Cyanocobalamin (VITAMIN B-12) 5000 MCG TBDP Take 5,000 mcg by mouth in the morning.    [provider]  cyclobenzaprine (FLEXERIL) 10 MG tablet Take 10 mg by mouth at bedtime. 07/05/16   [provider]  dexamethasone (DECADRON) 4 MG tablet Take 2 tablets daily for 2 days, start the day after chemotherapy. Take with food. 05/27/23   Michaelyn Barter, MD  Dulaglutide 1.5 MG/0.5ML SOPN Inject 1.5 mg into the skin every Saturday.    [provider]  fluconazole (DIFLUCAN) 100 MG tablet Take 1 tablet (100 mg total) by mouth daily. 07/26/23   Carmina Miller, MD  gabapentin (NEURONTIN) 100 MG capsule Take 200 mg by mouth with breakfast, with lunch, and with evening meal.    [provider]  gabapentin (NEURONTIN) 300 MG capsule Take 300 mg by mouth at bedtime.    [provider]  glipiZIDE (GLUCOTROL) 10 MG tablet Take 10 mg by mouth daily before breakfast.    [provider]  HYDROcodone-acetaminophen (NORCO/VICODIN) 5-325 MG tablet Take 1 tablet by mouth every 6 (six) hours as needed for moderate pain.    [provider]  Magnesium Oxide -Mg Supplement 500 MG TABS Take 1 tablet by mouth at bedtime.    [provider]  meclizine (ANTIVERT) 25 MG tablet Take 25 mg by mouth 3 (three) times daily as needed for dizziness. 12/29/17   [provider]  ondansetron (ZOFRAN) 8 MG tablet Take 1 tablet (8 mg total) by mouth every 8 (eight) hours as needed for nausea or vomiting. Start on the third day after chemotherapy. 05/27/23   Michaelyn Barter, MD  [facility] ULTRA TEST test strip  01/25/23   [provider]   oxybutynin (DITROPAN) 5 MG tablet Take 1 tablet by mouth 2 (two) times daily. 02/25/22   [provider]  polyethylene glycol powder (GLYCOLAX/MIRALAX) 17 GM/SCOOP powder 1 cap full in a full glass of water, two times a day for 5 days. 07/06/23   Sharman Cheek, MD  potassium chloride SA (KLOR-CON M) 20 MEQ tablet Take 1 tablet (20 mEq total) by mouth daily. 07/22/23   Creig Hines, MD  prochlorperazine (COMPAZINE) 10 MG tablet Take 1 tablet (10 mg total) by mouth every 6 (six) hours as  needed for nausea or vomiting. 05/27/23   Michaelyn Barter, MD  rOPINIRole (REQUIP) 1 MG tablet Take 1 mg by mouth at bedtime. 06/22/23   [provider]  sucralfate (CARAFATE) 1 g tablet Take 1 tablet (1 g total) by mouth 3 (three) times daily with meals. Dissolve tablet in 4 tablesppons warm water swish and swallow 30 min before meals. 07/22/23 08/21/23  Carmina Miller, MD  warfarin (COUMADIN) 6 MG tablet Take 6 mg by mouth at bedtime. 06/01/23   [provider]    Physical Exam: Vitals:   08/02/23 1427 08/02/23 1430 08/02/23 1441 08/02/23 1445  BP: (!) 89/54 (!) 107/52  (!) 86/50  Pulse:  87 75 82  Resp: (!) 26 (!) 22 (!) 23 15  Temp:    97.6 F (36.4 C)  TempSrc:    Axillary  SpO2:  98% 100% 100%   Physical Exam Vitals and nursing note reviewed.  Constitutional:      Appearance: She is normal weight.  HENT:     Mouth/Throat:     Mouth: Mucous membranes are moist.     Pharynx: Oropharynx is clear.  Eyes:     Conjunctiva/sclera: Conjunctivae normal.     Pupils: Pupils are equal, round, and reactive to light.  Cardiovascular:     Rate and Rhythm: Normal rate and regular rhythm.     Heart sounds: No murmur heard. Pulmonary:     Effort: Pulmonary effort is normal. No respiratory distress.     Breath sounds: Normal breath sounds. No wheezing or rales.  Abdominal:     General: Bowel sounds are normal. There is no distension.     Palpations: Abdomen is soft.     Tenderness:  There is no abdominal tenderness. There is no guarding.     Comments: Large volume bright red blood in briefs on examination  Musculoskeletal:     Right lower leg: No edema.     Left lower leg: No edema.  Skin:    General: Skin is warm and dry.  Neurological:     Mental Status: She is alert and oriented to person, place, and time. Mental status is at baseline.  Psychiatric:        Mood and Affect: Mood normal.        Behavior: Behavior normal.     Data Reviewed: CBC with WBC of 4.6, hemoglobin of 10.4, platelets 142 CMP with sodium of 131, potassium 3.9, bicarb 21, glucose 4 9, BUN 31, creat 1.05, albumin 2.9, and GFR 57 INR 9.8 PTT 49  EKG personally reviewed.  Sinus rhythm with rate of 88.  No acute ischemic changes.  CT ANGIO GI BLEED Result Date: 08/02/2023 CLINICAL DATA:  73 year old female with history of lower gastrointestinal hemorrhage. EXAM: CTA ABDOMEN AND PELVIS WITHOUT AND WITH CONTRAST TECHNIQUE: Multidetector CT imaging of the abdomen and pelvis was performed using the standard protocol during bolus administration of intravenous contrast. Multiplanar reconstructed images and MIPs were obtained and reviewed to evaluate the vascular anatomy. RADIATION DOSE REDUCTION: This exam was performed according to the departmental dose-optimization program which includes automated exposure control, adjustment of the mA and/or kV according to patient size and/or use of iterative reconstruction technique. CONTRAST:  OMNIPAQUE IOHEXOL 350 MG/ML SOLN COMPARISON:  07/06/2023 FINDINGS: VASCULAR Aorta: Normal caliber aorta without aneurysm, dissection, vasculitis or significant stenosis. Mild scattered atherosclerotic calcifications. Celiac: Patent without evidence of aneurysm, dissection, vasculitis or significant stenosis. SMA: Patent without evidence of aneurysm, dissection, vasculitis or significant stenosis.  Renals: Single bilateral renal arteries are patent without evidence of  aneurysm, dissection, vasculitis, fibromuscular dysplasia or significant stenosis. IMA: Patent without evidence of aneurysm, dissection, vasculitis or significant stenosis. Inflow: Patent without evidence of aneurysm, dissection, vasculitis or significant stenosis. Proximal Outflow: Bilateral common femoral and visualized portions of the superficial and profunda femoral arteries are patent without evidence of aneurysm, dissection, vasculitis or significant stenosis. Veins: No obvious venous abnormality within the limitations of this arterial phase study. Review of the MIP images confirms the above findings. NON-VASCULAR Lower chest: No acute abnormality. Hepatobiliary: No focal liver abnormality is seen. Unchanged appearance of multiple peripherally calcified gallstones layering in the gallbladder neck without evidence of gallbladder distension, wall thickening, or pericholecystic fluid. No intra or extrahepatic biliary ductal dilation. Pancreas: Unremarkable. No pancreatic ductal dilatation or surrounding inflammatory changes. Spleen: Normal in size without focal abnormality. Adrenals/Urinary Tract: Adrenal glands are unremarkable. Kidneys are normal, without renal calculi, focal lesion, or hydronephrosis. Bladder is unremarkable. Stomach/Bowel: Stomach is within normal limits. Similar appearing small bowel surgical anastomosis in the anterior lower abdomen without complicating features. Unchanged colo-colonic anastomosis without complicating features at the rectosigmoid junction. No evidence of bowel wall thickening, distention, or inflammatory changes. There is focal hyperenhancement on venous phase only within the lumen of the distal rectum. Lymphatic: No abdominopelvic lymphadenopathy. Reproductive: Uterus and bilateral adnexa are unremarkable. Other: Unchanged supraumbilical hernia containing transverse colon and small bowel without evidence of strangulation or obstruction. Musculoskeletal: No acute osseous  abnormality. Similar appearing multilevel degenerative changes of the visualized thoracolumbar spine. IMPRESSION: 1. Venous phase intraluminal extravasation about the distal rectum suggestive of internal hemorrhoidal hemorrhage. No additional evidence of gastrointestinal hemorrhage. 2. Cholelithiasis. 3.  Aortic Atherosclerosis (ICD10-I70.0). Marliss Coots, MD [facility] Electronically Signed   By: Marliss Coots M.D.   On: 08/02/2023 14:12    Results are pending, will review when available.  Assessment and Plan:  * Hemorrhagic shock (HCC) Patient is presenting with profound hypotension in the setting of hemorrhoidal bleed with supratherapeutic INR. Hemoglobin is only slightly lower compared to prior at 10.4, however suspect that this has not reflected her active blood loss that occurred during and just prior to examination.  Despite 1 unit, she remains hypotensive.  - Telemetry monitoring - Admit to stepdown - Additional 1 unit of packed RBC ordered - 1 L bolus - Continue maintenance fluids  Acute GI bleeding Patient presented with large-volume bright red blood per rectum, with CTA demonstrating venous phase intraluminal extravasation at the distal rectum, most suspicious for internal hemorrhoidal hemorrhage.  Suspect that severity of bleed is due to supratherapeutic INR.  - GI consulted; appreciate their recommendations - Pending warfarin reversal, will consult IR for consideration of embolization if patient continues to be hemodynamically unstable  Supratherapeutic INR Unclear etiology for patient's INR becoming so high at 9.8, and unfortunately she has taken her home warfarin today.  - S/p vitamin K and Kcentra - Repeat PT/INR pending - Pharmacy consultation for post Kcentra monitoring  Recurrent non-small cell lung cancer (NSCLC) (HCC) History of stage I adenocarcinoma of the right upper lung with recurrence in the  mediastinum currently on radiation and chemotherapy.  Chronic diastolic (congestive) heart failure (HCC) History of HFpEF with last EF within normal limits back in 2018.  She is not on diuretics at home. Patient appears hypovolemic at this time.  History of pulmonary embolism Distant history of PE and DVT in 2018 in 2019, on warfarin since then.  Holding  at this time given supratherapeutic INR  - May benefit from risk/benefit discussion regarding warfarin, and consideration of Eliquis/Xarelto  Essential hypertension - Hold home antihypertensives in the setting of hypotension  Type 2 diabetes mellitus with obesity (HCC) Last A1c of 7.0%  - SSI, moderate - Hold home regimen  Advance Care Planning:   Code Status: Full Code verified by patient  Consults: GI  Family Communication: No family at bedside  Severity of Illness: The appropriate patient status for this patient is INPATIENT. Inpatient status is judged to be reasonable and necessary in order to provide the required intensity of service to ensure the patient's safety. The patient's presenting symptoms, physical exam findings, and initial radiographic and laboratory data in the context of their chronic comorbidities is felt to place them at high risk for further clinical deterioration. Furthermore, it is not anticipated that the patient will be medically stable for discharge from the hospital within 2 midnights of admission.   * I certify that at the point of admission it is my clinical judgment that the patient will require inpatient hospital care spanning beyond 2 midnights from the point of admission due to high intensity of service, high risk for further deterioration and high frequency of surveillance required.*  Author: Verdene Lennert, MD 08/02/2023 2:51 PM  For on call review [email or web address].

## 2023-08-02 NOTE — Assessment & Plan Note (Signed)
 -  Hold home antihypertensives in the setting of hypotension

## 2023-08-02 NOTE — Assessment & Plan Note (Signed)
 History of stage I adenocarcinoma of the right upper lung with recurrence in the mediastinum currently on radiation and chemotherapy.

## 2023-08-03 ENCOUNTER — Encounter: Payer: Self-pay | Admitting: Internal Medicine

## 2023-08-03 ENCOUNTER — Ambulatory Visit: Payer: PPO

## 2023-08-03 ENCOUNTER — Encounter: Payer: Self-pay | Admitting: Oncology

## 2023-08-03 ENCOUNTER — Ambulatory Visit

## 2023-08-03 DIAGNOSIS — K922 Gastrointestinal hemorrhage, unspecified: Secondary | ICD-10-CM | POA: Diagnosis not present

## 2023-08-03 DIAGNOSIS — R791 Abnormal coagulation profile: Secondary | ICD-10-CM | POA: Diagnosis not present

## 2023-08-03 DIAGNOSIS — R578 Other shock: Secondary | ICD-10-CM | POA: Diagnosis not present

## 2023-08-03 LAB — CBC WITH DIFFERENTIAL/PLATELET
Abs Immature Granulocytes: 0.16 10*3/uL — ABNORMAL HIGH (ref 0.00–0.07)
Basophils Absolute: 0 10*3/uL (ref 0.0–0.1)
Basophils Relative: 1 %
Eosinophils Absolute: 0 10*3/uL (ref 0.0–0.5)
Eosinophils Relative: 0 %
HCT: 31.3 % — ABNORMAL LOW (ref 36.0–46.0)
Hemoglobin: 10.6 g/dL — ABNORMAL LOW (ref 12.0–15.0)
Immature Granulocytes: 4 %
Lymphocytes Relative: 15 %
Lymphs Abs: 0.6 10*3/uL — ABNORMAL LOW (ref 0.7–4.0)
MCH: 28.7 pg (ref 26.0–34.0)
MCHC: 33.9 g/dL (ref 30.0–36.0)
MCV: 84.8 fL (ref 80.0–100.0)
Monocytes Absolute: 0.1 10*3/uL (ref 0.1–1.0)
Monocytes Relative: 4 %
Neutro Abs: 2.8 10*3/uL (ref 1.7–7.7)
Neutrophils Relative %: 76 %
Platelets: 99 10*3/uL — ABNORMAL LOW (ref 150–400)
RBC: 3.69 MIL/uL — ABNORMAL LOW (ref 3.87–5.11)
RDW: 17.6 % — ABNORMAL HIGH (ref 11.5–15.5)
WBC: 3.7 10*3/uL — ABNORMAL LOW (ref 4.0–10.5)
nRBC: 0 % (ref 0.0–0.2)

## 2023-08-03 LAB — BPAM RBC
Blood Product Expiration Date: 202503282359
Blood Product Expiration Date: 202503282359
ISSUE DATE / TIME: 202503111313
ISSUE DATE / TIME: 202503111438
Unit Type and Rh: 5100
Unit Type and Rh: 5100

## 2023-08-03 LAB — TYPE AND SCREEN
ABO/RH(D): O NEG
Antibody Screen: NEGATIVE
Unit division: 0
Unit division: 0

## 2023-08-03 LAB — CBG MONITORING, ED
Glucose-Capillary: 122 mg/dL — ABNORMAL HIGH (ref 70–99)
Glucose-Capillary: 252 mg/dL — ABNORMAL HIGH (ref 70–99)

## 2023-08-03 LAB — PROTIME-INR
INR: 1.3 — ABNORMAL HIGH (ref 0.8–1.2)
Prothrombin Time: 16.2 s — ABNORMAL HIGH (ref 11.4–15.2)

## 2023-08-03 MED ORDER — HEPARIN SOD (PORK) LOCK FLUSH 100 UNIT/ML IV SOLN
INTRAVENOUS | Status: AC
  Administered 2023-08-03: 500 [IU]
  Filled 2023-08-03: qty 5

## 2023-08-03 NOTE — Inpatient Diabetes Management (Signed)
 Inpatient Diabetes Program Recommendations  AACE/ADA: New Consensus Statement on Inpatient Glycemic Control   Target Ranges:  Prepandial:   less than 140 mg/dL      Peak postprandial:   less than 180 mg/dL (1-2 hours)      Critically ill patients:  140 - 180 mg/dL    Latest Reference Range & Units 08/02/23 17:11 08/02/23 22:44  Glucose-Capillary 70 - 99 mg/dL 409 (H) 811 (H)    Latest Reference Range & Units 08/02/23 11:08  Glucose 70 - 99 mg/dL 914 (H)   Review of Glycemic Control  Diabetes history: DM2 Outpatient Diabetes medications: Trulicity 1.5 mg Qweek, Glipizide 10 mg daily Current orders for Inpatient glycemic control: Novolog 0-15 units TID with meals  Inpatient Diabetes Program Recommendations:    Insulin: Please consider ordering Semglee 5 units daily and adding Novolog 0-5 units at bedtime.  Thanks, Orlando Penner, RN, MSN, CDCES Diabetes Coordinator Inpatient Diabetes Program [phone] (Team Pager from 8am to 5pm)

## 2023-08-03 NOTE — ED Notes (Signed)
 Pt brief changed. Urine only. No stool and no blood in brief

## 2023-08-03 NOTE — Discharge Summary (Signed)
 Physician Discharge Summary   Patient: Kara [name] MRN: [id number] DOB: Dec 30, 1950  Admit date:     08/02/2023  Discharge date: 08/03/23  Discharge Physician: Delfino Lovett   PCP: Barbette Reichmann, MD   Recommendations at discharge:   Follow-up with outpatient providers as requested PT/INR on 3/14 with results to PCP for Coumadin dose adjustment  Discharge Diagnoses: Principal Problem:   Hemorrhagic shock (HCC) Active Problems:   Acute GI bleeding   Supratherapeutic INR   Type 2 diabetes mellitus with obesity (HCC)   Essential hypertension   History of pulmonary embolism   Chronic diastolic (congestive) heart failure (HCC)   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    -Status post 1 unit of packed RBC transfusion Blood pressure stabilized.   Acute GI bleeding Patient presented with large-volume bright red blood per rectum, with CTA demonstrating venous phase intraluminal extravasation at the distal rectum, most suspicious for internal hemorrhoidal hemorrhage.  Suspect that severity of bleed is due to supratherapeutic INR.   - GI and interventional radiology seen; appreciate their recommendations.  Conservative management recommended.  Patient had no further bleed and H&H had remained stable - Status post warfarin reversal, with INR 1.3   Supratherapeutic INR Unclear etiology for patient's INR becoming so high at 9.8   - S/p vitamin K and Kcentra - Repeat PT/INR shows 1.3. 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   Chronic diastolic (congestive) heart failure (HCC) History of HFpEF with last EF within normal limits back in 2018.  She is not on diuretics at home. Patient appears euvolemic   History of pulmonary embolism Distant history of PE and DVT in 2018 in 2019, on warfarin since then.  Can resume warfarin at discharge   - May benefit from risk/benefit discussion regarding warfarin, and consideration of Eliquis/Xarelto as an outpatient   Essential hypertension Type 2 diabetes mellitus with obesity (HCC) Last A1c of 7.0%    Patient demanded discharge as she is not interested in staying here any further.  She is not having any further bleed.  She is agreeable to see her primary care and surgeon for evaluation of her internal hemorrhoids.  She tolerated diet and her hemodynamics have been stable.      Consultants: GI and interventional radiology Disposition: Home Diet recommendation:  Discharge Diet Orders (From admission, onward)     Start     Ordered   08/03/23 0000  Diet - low sodium heart healthy        08/03/23 1226           Carb modified diet DISCHARGE MEDICATION: Allergies as of 08/03/2023       Reactions   Adhesive [tape] Other (See Comments)   Pt reports, when removing tape from skin most tapes pull her skin off. Ok to use paper tape  Morphine And Codeine Nausea And Vomiting   Other Hives   Chlorhexadine wipes/CHG wipes,    Latex Rash   Exam gloves/ does not react around elastic and lips don't swell when blowing balloons        Medication List     STOP taking these medications    calcium carbonate 600 MG Tabs tablet Commonly known as: Calcium 600   meclizine 25 MG tablet Commonly known as: ANTIVERT    ondansetron 8 MG tablet Commonly known as: Zofran   polyethylene glycol powder 17 GM/SCOOP powder Commonly known as: GLYCOLAX/MIRALAX       TAKE these medications    bisacodyl 10 MG suppository Commonly known as: DULCOLAX Place 1 suppository (10 mg total) rectally as needed for moderate constipation.   candesartan 16 MG tablet Commonly known as: ATACAND Take 16 mg by mouth in the morning.   cholecalciferol 25 MCG (1000 UNIT) tablet Commonly known as: VITAMIN D3 Take 1,000 Units by mouth in the morning.   cyclobenzaprine 10 MG tablet Commonly known as: FLEXERIL Take 10 mg by mouth at bedtime.   dexamethasone 4 MG tablet Commonly known as: DECADRON Take 2 tablets daily for 2 days, start the day after chemotherapy. Take with food.   fluconazole 100 MG tablet Commonly known as: DIFLUCAN Take 1 tablet (100 mg total) by mouth daily.   gabapentin 100 MG capsule Commonly known as: NEURONTIN Take 200 mg by mouth with breakfast, with lunch, and with evening meal.   gabapentin 300 MG capsule Commonly known as: NEURONTIN Take 300 mg by mouth at bedtime.   glipiZIDE 10 MG tablet Commonly known as: GLUCOTROL Take 10 mg by mouth daily before breakfast.   HYDROcodone-acetaminophen 5-325 MG tablet Commonly known as: NORCO/VICODIN Take 1 tablet by mouth every 6 (six) hours as needed for moderate pain.   Magnesium Oxide -Mg Supplement 500 MG Tabs Take 1 tablet by mouth at bedtime.   OneTouch Ultra Test test strip Generic drug: glucose blood   oxybutynin 5 MG tablet Commonly known as: DITROPAN Take 1 tablet by mouth 2 (two) times daily.   potassium chloride SA 20 MEQ tablet Commonly known as: KLOR-CON M Take 1 tablet (20 mEq total) by mouth daily.   prochlorperazine 10 MG tablet Commonly known as: COMPAZINE Take 1 tablet (10 mg total) by mouth every 6 (six) hours as needed for nausea or vomiting.   rOPINIRole 1 MG tablet Commonly known as: REQUIP Take 1 mg by  mouth at bedtime.   sucralfate 1 g tablet Commonly known as: Carafate Take 1 tablet (1 g total) by mouth 3 (three) times daily with meals. Dissolve tablet in 4 tablesppons warm water swish and swallow 30 min before meals. What changed: additional instructions   Trulicity 1.5 MG/0.5ML Soaj Generic drug: Dulaglutide Inject 1.5 mg into the skin once a week. Saturday   Vitamin B-12 5000 MCG Tbdp Take 5,000 mcg by mouth in the morning.   warfarin 6 MG tablet Commonly known as: COUMADIN Take 6 mg by mouth at bedtime.        Follow-up Information     Barbette Reichmann, MD. Schedule an appointment as soon as possible for a visit in 1 week(s).   Specialty: Internal Medicine Why: [facility] Discharge F/UP Contact information: [address] [place] [address] [phone]         Creig Hines, MD. Schedule an appointment as soon as possible for a visit in 3 week(s).  Specialty: Oncology Why: [facility] Discharge F/UP Contact information: [address] [place] [address] [phone]         Carolan Shiver, MD. Schedule an appointment as soon as possible for a visit in 1 week(s).   Specialty: General Surgery Why: [facility] Discharge F/UP Contact information: [address] [place] [address] [phone]                Discharge Exam: There were no vitals filed for this visit. 73 year old female lying in the bed comfortably without any acute distress Lungs clear to auscultation bilaterally Heart regular rate and rhythm Abdomen soft, benign Neuro alert and awake, nonfocal Skin no rash or lesion Psych normal mood and affect  Condition at discharge: good  The results of significant diagnostics from this hospitalization (including imaging, microbiology, ancillary and laboratory) are listed below for reference.   Imaging Studies: CT ANGIO GI BLEED Addendum Date: 08/02/2023 ADDENDUM  REPORT: 08/02/2023 15:41 ADDENDUM: Upon further review there is active arterial extravasation within a loop of bowel in the right lower quadrant at the site of prior distal small bowel anastomosis. Marliss Coots, MD [facility] Electronically Signed   By: Marliss Coots M.D.   On: 08/02/2023 15:41   Result Date: 08/02/2023 CLINICAL DATA:  73 year old female with history of lower gastrointestinal hemorrhage. EXAM: CTA ABDOMEN AND PELVIS WITHOUT AND WITH CONTRAST TECHNIQUE: Multidetector CT imaging of the abdomen and pelvis was performed using the standard protocol during bolus administration of intravenous contrast. Multiplanar reconstructed images and MIPs were obtained and reviewed to evaluate the vascular anatomy. RADIATION DOSE REDUCTION: This exam was performed according to the departmental dose-optimization program which includes automated exposure control, adjustment of the mA and/or kV according to patient size and/or use of iterative reconstruction technique. CONTRAST:  OMNIPAQUE IOHEXOL 350 MG/ML SOLN COMPARISON:  07/06/2023 FINDINGS: VASCULAR Aorta: Normal caliber aorta without aneurysm, dissection, vasculitis or significant stenosis. Mild scattered atherosclerotic calcifications. Celiac: Patent without evidence of aneurysm, dissection, vasculitis or significant stenosis. SMA: Patent without evidence of aneurysm, dissection, vasculitis or significant stenosis. Renals: Single bilateral renal arteries are patent without evidence of aneurysm, dissection, vasculitis, fibromuscular dysplasia or significant stenosis. IMA: Patent without evidence of aneurysm, dissection, vasculitis or significant stenosis. Inflow: Patent without evidence of aneurysm, dissection, vasculitis or significant stenosis. Proximal Outflow: Bilateral common femoral and visualized portions of the superficial and profunda femoral arteries are patent without evidence of  aneurysm, dissection, vasculitis or significant stenosis. Veins: No obvious venous abnormality within the limitations of this arterial phase study. Review of the MIP images confirms the above findings. NON-VASCULAR Lower chest: No acute abnormality. Hepatobiliary: No focal liver abnormality is seen. Unchanged appearance of multiple peripherally calcified gallstones layering in the gallbladder neck without evidence of gallbladder distension, wall thickening, or pericholecystic fluid. No intra or extrahepatic biliary ductal dilation. Pancreas: Unremarkable. No pancreatic ductal dilatation or surrounding inflammatory changes. Spleen: Normal in size without focal abnormality. Adrenals/Urinary Tract: Adrenal glands are unremarkable. Kidneys are normal, without renal calculi, focal lesion, or hydronephrosis. Bladder is unremarkable. Stomach/Bowel: Stomach is within normal limits. Similar appearing small bowel surgical anastomosis in the anterior lower abdomen without complicating features. Unchanged colo-colonic anastomosis without complicating features at the rectosigmoid junction. No evidence of bowel wall thickening, distention, or inflammatory changes. There is focal hyperenhancement on venous phase only within the lumen of the distal rectum. Lymphatic: No abdominopelvic lymphadenopathy. Reproductive: Uterus and bilateral adnexa are unremarkable. Other: Unchanged supraumbilical hernia containing  transverse colon and small bowel without evidence of strangulation or obstruction. Musculoskeletal: No acute osseous abnormality. Similar appearing multilevel degenerative changes of the visualized thoracolumbar spine. IMPRESSION: 1. Venous phase intraluminal extravasation about the distal rectum suggestive of internal hemorrhoidal hemorrhage. No additional evidence of gastrointestinal hemorrhage. 2. Cholelithiasis. 3.  Aortic Atherosclerosis (ICD10-I70.0). Marliss Coots, MD [facility]  [facility] Electronically Signed: By: Marliss Coots M.D. On: 08/02/2023 14:12   CT ABDOMEN PELVIS WO CONTRAST Result Date: 07/06/2023 CLINICAL DATA:  Lung cancer.  Abdominal pain. * Tracking Code: BO * EXAM: CT ABDOMEN AND PELVIS WITHOUT CONTRAST TECHNIQUE: Multidetector CT imaging of the abdomen and pelvis was performed following the standard protocol without IV contrast. RADIATION DOSE REDUCTION: This exam was performed according to the departmental dose-optimization program which includes automated exposure control, adjustment of the mA and/or kV according to patient size and/or use of iterative reconstruction technique. COMPARISON:  PET-CT scan 06/03/2023.  Older exams as well. FINDINGS: Lower chest: Parenchymal opacity seen right perihilar with areas of right lung nodularity as seen on the prior examination. The opacities are similar. Please correlate with known history of lung cancer and recent staging PET-CT. Tiny right pleural effusion is also seen. Breathing motion. Central chest catheter in place. Hepatobiliary: Dependent stones in the gallbladder. Preserved hepatic parenchyma. Pancreas: Unremarkable. No pancreatic ductal dilatation or surrounding inflammatory changes. Spleen: Normal in size without focal abnormality. Adrenals/Urinary Tract: Adrenal glands are unremarkable. Kidneys are normal, without renal calculi, focal lesion, or hydronephrosis. Bladder is unremarkable. Stomach/Bowel: Stomach is nondilated. There is some luminal fluid and debris. The small bowel has a normal course and caliber as well. No dilatation. Large bowel has extensive colonic stool diffusely. The right side of the colon is mildly distended. There is associated midline anterior epigastric hernia involving a small portion of the transverse colon. There is a mild caliber change at the level of the hernia. Opening along the anterior abdominal wall in the axial plane approaches 6.1 cm. Please correlate for reduce ability  of the hernia. No pneumatosis, free air. Vascular/Lymphatic: Aortic atherosclerosis. No enlarged abdominal or pelvic lymph nodes. Reproductive: Uterus and bilateral adnexa are unremarkable. Other: No abdominal wall hernia or abnormality. No abdominopelvic ascites. Musculoskeletal: Degenerative changes along the spine, advanced along the lower lumbar region. Trace anterolisthesis of L4-5 and L5-S1. multilevel stenosis. IMPRESSION: Large amount of colonic stool. There is a epigastric midline anterior abdominal wall hernia involving the transverse colon. Slight caliber change at the level of the hernia. No clear signs of true obstruction at this time. No pneumatosis or free air. Please correlate for reducibility of the hernia. Gallstones. Advanced degenerative changes of the spine with listhesis. Masslike opacity seen in the right lung with nodularity similar to the previous examination. Tiny right effusion. Please correlate with the prior PET-CT scan. Electronically Signed   By: Karen Kays M.D.   On: 07/06/2023 16:19   CT Head Wo Contrast Result Date: 07/06/2023 CLINICAL DATA:  Headache, nausea and vomiting, history of lung cancer EXAM: CT HEAD WITHOUT CONTRAST TECHNIQUE: Contiguous axial images were obtained from the base of the skull through the vertex without intravenous contrast. RADIATION DOSE REDUCTION: This exam was performed according to the departmental dose-optimization program which includes automated exposure control, adjustment of the mA and/or kV according to patient size and/or use of iterative reconstruction technique. COMPARISON:  06/03/2023 FINDINGS: Brain: No acute infarct or hemorrhage. Stable hypodensities scattered within the periventricular white matter and basal ganglia, compatible with chronic small  vessel ischemic changes. Lateral ventricles and remaining midline structures are unremarkable. No acute extra-axial fluid collections. No mass effect. Vascular: Stable atherosclerosis.  No  hyperdense vessel. Skull: Normal. Negative for fracture or focal lesion. Sinuses/Orbits: No acute finding. Other: None. IMPRESSION: 1. Stable head CT, no acute intracranial process. Electronically Signed   By: Sharlet Salina M.D.   On: 07/06/2023 16:13    Microbiology: Results for orders placed or performed during the hospital encounter of 07/06/23  Resp panel by RT-PCR (RSV, Flu A&B, Covid) Anterior Nasal Swab     Status: None   Collection Time: 07/06/23 11:10 AM   Specimen: Anterior Nasal Swab  Result Value Ref Range Status   SARS Coronavirus 2 by RT PCR NEGATIVE NEGATIVE Final    Comment: (NOTE) SARS-CoV-2 target nucleic acids are NOT DETECTED.  The SARS-CoV-2 RNA is generally detectable in upper respiratory specimens during the acute phase of infection. The lowest concentration of SARS-CoV-2 viral copies this assay can detect is 138 copies/mL. A negative result does not preclude SARS-Cov-2 infection and should not be used as the sole basis for treatment or other patient management decisions. A negative result may occur with  improper specimen collection/handling, submission of specimen other than nasopharyngeal swab, presence of viral mutation(s) within the areas targeted by this assay, and inadequate number of viral copies(<138 copies/mL). A negative result must be combined with clinical observations, patient history, and epidemiological information. The expected result is Negative.  Fact Sheet for Patients:  BloggerCourse.com  Fact Sheet for Healthcare Providers:  SeriousBroker.it  This test is no t yet approved or cleared by the [place] FDA and  has been authorized for detection and/or diagnosis of SARS-CoV-2 by FDA under an Emergency Use Authorization (EUA). This EUA will remain  in effect (meaning this test can be used) for the duration of the COVID-19 declaration under Section 564(b)(1) of the Act, 21 U.S.C.section  360bbb-3(b)(1), unless the authorization is terminated  or revoked sooner.       Influenza A by PCR NEGATIVE NEGATIVE Final   Influenza B by PCR NEGATIVE NEGATIVE Final    Comment: (NOTE) The Xpert Xpress SARS-CoV-2/FLU/RSV plus assay is intended as an aid in the diagnosis of influenza from Nasopharyngeal swab specimens and should not be used as a sole basis for treatment. Nasal washings and aspirates are unacceptable for Xpert Xpress SARS-CoV-2/FLU/RSV testing.  Fact Sheet for Patients: BloggerCourse.com  Fact Sheet for Healthcare Providers: SeriousBroker.it  This test is not yet approved or cleared by the [place] FDA and has been authorized for detection and/or diagnosis of SARS-CoV-2 by FDA under an Emergency Use Authorization (EUA). This EUA will remain in effect (meaning this test can be used) for the duration of the COVID-19 declaration under Section 564(b)(1) of the Act, 21 U.S.C. section 360bbb-3(b)(1), unless the authorization is terminated or revoked.     Resp Syncytial Virus by PCR NEGATIVE NEGATIVE Final    Comment: (NOTE) Fact Sheet for Patients: BloggerCourse.com  Fact Sheet for Healthcare Providers: SeriousBroker.it  This test is not yet approved or cleared by the [place] FDA and has been authorized for detection and/or diagnosis of SARS-CoV-2 by FDA under an Emergency Use Authorization (EUA). This EUA will remain in effect (meaning this test can be used) for the duration of the COVID-19 declaration under Section 564(b)(1) of the Act, 21 U.S.C. section 360bbb-3(b)(1), unless the authorization is terminated or revoked.  Performed at [facility], [address], [place], [place] [address]  Labs: CBC: Recent Labs  Lab 07/29/23 0852 08/02/23 1108 08/02/23 1440 08/02/23 2211 08/03/23 0525  WBC 4.1 4.6 3.8* 4.5 3.7*  NEUTROABS  2.3  --   --  3.4 2.8  HGB 11.2* 10.4* 10.2* 10.6* 10.6*  HCT 34.6* 32.2* 30.3* 31.0* 31.3*  MCV 84.6 85.0 82.8 86.6 84.8  PLT 170 142* 119* 93* 99*   Basic Metabolic Panel: Recent Labs  Lab 07/29/23 0852 08/02/23 1108  NA 134* 131*  K 3.5 3.9  CL 102 101  CO2 22 21*  GLUCOSE 221* 409*  BUN 15 31*  CREATININE 0.75 1.05*  CALCIUM 8.5* 8.2*   Liver Function Tests: Recent Labs  Lab 07/29/23 0852 08/02/23 1108  AST 19 18  ALT 29 27  ALKPHOS 68 48  BILITOT 0.4 0.7  PROT 6.4* 5.5*  ALBUMIN 3.5 2.9*   CBG: Recent Labs  Lab 08/02/23 1711 08/02/23 2244 08/03/23 0828 08/03/23 1205  GLUCAP 221* 261* 122* 252*    Discharge time spent: greater than 30 minutes.  Signed: Delfino Lovett, MD [facility] 08/03/2023

## 2023-08-03 NOTE — ED Notes (Signed)
Pt sitting up at bedside with meal tray

## 2023-08-04 ENCOUNTER — Ambulatory Visit

## 2023-08-04 ENCOUNTER — Ambulatory Visit: Payer: PPO

## 2023-08-05 ENCOUNTER — Other Ambulatory Visit: Payer: Self-pay | Admitting: Nurse Practitioner

## 2023-08-05 ENCOUNTER — Inpatient Hospital Stay: Payer: PPO

## 2023-08-05 ENCOUNTER — Encounter: Payer: Self-pay | Admitting: Internal Medicine

## 2023-08-05 ENCOUNTER — Ambulatory Visit: Payer: PPO

## 2023-08-05 ENCOUNTER — Ambulatory Visit

## 2023-08-05 DIAGNOSIS — C3411 Malignant neoplasm of upper lobe, right bronchus or lung: Secondary | ICD-10-CM

## 2023-08-05 NOTE — Progress Notes (Signed)
 Patient did not come to appt for treatment today. Discharged from hospital on 08/03/23. I left vm for patient and her spouse to get an update. Treatment plan deferred to next week.

## 2023-08-07 ENCOUNTER — Encounter: Payer: Self-pay | Admitting: Oncology

## 2023-08-07 ENCOUNTER — Encounter: Payer: Self-pay | Admitting: Internal Medicine

## 2023-08-07 DIAGNOSIS — R000 Tachycardia, unspecified: Secondary | ICD-10-CM | POA: Diagnosis not present

## 2023-08-07 DIAGNOSIS — R531 Weakness: Secondary | ICD-10-CM | POA: Diagnosis not present

## 2023-08-07 DIAGNOSIS — I10 Essential (primary) hypertension: Secondary | ICD-10-CM | POA: Diagnosis not present

## 2023-08-07 DIAGNOSIS — R58 Hemorrhage, not elsewhere classified: Secondary | ICD-10-CM | POA: Diagnosis not present

## 2023-08-07 DIAGNOSIS — R42 Dizziness and giddiness: Secondary | ICD-10-CM | POA: Diagnosis not present

## 2023-08-07 NOTE — Progress Notes (Signed)
 Hematology/Oncology Consult note [facility]  Telephone:([phone] [phone]  Patient Care Team: Barbette Reichmann, MD as PCP - General (Internal Medicine) Glory Buff, RN as Oncology Nurse Navigator Creig Hines, MD as Consulting Physician (Oncology)   Name of the patient: Kara [name]  [id number]  1951/03/14   Date of visit: 08/07/23  Diagnosis- right upper lobe lung cancer s/p SBRT now with mediastinal recurrence     Chief complaint/ Reason for visit-assessment prior to cycle 5 of weekly CarboTaxol chemotherapy  Heme/Onc history: Patient is a 73 year old female who underwent SBRT for presumed right upper lobe lung cancer in December 2022. She was in remission until November 2024 when she was noted to have hypermetabolic in the right hilum as well as mediastinal region. 15 x 12 mm nodular opacity in the dome of the right diaphragm. 10 mm left cervical lymph node. 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 Right pleural thoracentesis negative for malignancy. Plan is for concurrent chemoradiation with weekly CarboTaxol.   Interval history-she is tolerating chemotherapy well so far.  Has baseline fatigue and exertional shortness of breath but denies any new complaints at this time.  Remains independent of her ADLs.  ECOG PS- 1 Pain scale- 0 Opioid associated constipation- no  Review of systems- Review of Systems  Constitutional:  Positive for malaise/fatigue. Negative for chills, fever and weight loss.  HENT:  Negative for congestion, ear discharge and nosebleeds.   Eyes:  Negative for blurred vision.  Respiratory:  Negative for cough, hemoptysis, sputum production, shortness of breath and wheezing.   Cardiovascular:  Negative for chest pain, palpitations, orthopnea and claudication.  Gastrointestinal:  Negative for abdominal pain, blood in stool, constipation, diarrhea, heartburn, melena, nausea and  vomiting.  Genitourinary:  Negative for dysuria, flank pain, frequency, hematuria and urgency.  Musculoskeletal:  Negative for back pain, joint pain and myalgias.  Skin:  Negative for rash.  Neurological:  Negative for dizziness, tingling, focal weakness, seizures, weakness and headaches.  Endo/Heme/Allergies:  Does not bruise/bleed easily.  Psychiatric/Behavioral:  Negative for depression and suicidal ideas. The patient does not have insomnia.       Allergies  Allergen Reactions   Adhesive [Tape] Other (See Comments)    Pt reports, when removing tape from skin most tapes pull her skin off. Ok to use paper tape   Morphine And Codeine Nausea And Vomiting   Other Hives    Chlorhexadine wipes/CHG wipes,    Latex Rash    Exam gloves/ does not react around elastic and lips don't swell when blowing balloons     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, 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   CHF (congestive heart failure) (HCC)    followed colon resection, "wouldn't let me get up"   Chronic anticoagulation    Degenerative disc disease, lumbar    Diverticulitis 2018   Diverticulosis    DVT of lower extremity, bilateral (HCC) 2018   Essential hypertension    History of being hospitalized    1 month ago for 3 days, vomiting, and kink in upper intestine   History of hiatal hernia    Mediastinal adenopathy 04/2023   MRSA nasal colonization    Non-small cell lung cancer (HCC) 2022   right   Obesity (BMI 30-39.9)    Osteoarthritis    Osteoporosis    PE (pulmonary thromboembolism) (  HCC) 2018   Pneumonia    Small bowel obstruction (HCC) 03/20/2023   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HCC)    Type 2 diabetes mellitus with obesity (HCC)    Vertigo      Past Surgical History:  Procedure Laterality Date   APPENDECTOMY N/A 02/24/2017   Procedure: Incidental  APPENDECTOMY;  Surgeon: Ricarda Frame, MD;  Location: [name];   Service: General;  Laterality: N/A;   CARPAL TUNNEL RELEASE Bilateral    CATARACT EXTRACTION W/PHACO Left 11/21/2018   Procedure: CATARACT EXTRACTION PHACO AND INTRAOCULAR LENS PLACEMENT (IOC) LEFT DIABETES;  Surgeon: Galen Manila, MD;  Location: [facility];  Service: Ophthalmology;  Laterality: Left;  latex sensitivity Diabetic - oral meds   CATARACT EXTRACTION W/PHACO Right 12/12/2018   Procedure: CATARACT EXTRACTION PHACO AND INTRAOCULAR LENS PLACEMENT (IOC) RIGHT DIABETES;  Surgeon: Galen Manila, MD;  Location: [facility];  Service: Ophthalmology;  Laterality: Right;  Diabetes-oral med Latex sensitiviy   COLON RESECTION SIGMOID N/A 11/02/2016   Procedure: COLON RESECTION SIGMOID;  Surgeon: Ricarda Frame, MD;  Location: [name];  Service: General;  Laterality: N/A;   COLON SURGERY  11/02/2016   COLONOSCOPY WITH PROPOFOL N/A 02/03/2017   Procedure: COLONOSCOPY WITH PROPOFOL;  Surgeon: Toney Reil, MD;  Location: [name];  Service: Gastroenterology;  Laterality: N/A;   COLOSTOMY N/A 11/02/2016   Procedure: COLOSTOMY;  Surgeon: Ricarda Frame, MD;  Location: [name];  Service: General;  Laterality: N/A;   COLOSTOMY REVERSAL N/A 02/24/2017   Procedure: COLOSTOMY REVERSAL, ostomy takedown, spleenic flexure mobilization, excision rectal stump/distal sigmoid, anastomosis with suture reinforcement;  Surgeon: Ricarda Frame, MD;  Location: [name];  Service: General;  Laterality: N/A;   ENDOBRONCHIAL ULTRASOUND N/A 05/16/2023   Procedure: ENDOBRONCHIAL ULTRASOUND;  Surgeon: [name], MD;  Location: [name];  Service: Cardiopulmonary;  Laterality: N/A;   FLEXIBLE BRONCHOSCOPY N/A 05/16/2023   Procedure: FLEXIBLE BRONCHOSCOPY;  Surgeon: [name], MD;  Location: [name];  Service: Cardiopulmonary;  Laterality: N/A;   ILEO LOOP DIVERSION N/A 02/24/2017   Procedure: ILEO LOOP COLOSTOMY;  Surgeon: Ricarda Frame, MD;  Location:  [name];  Service: General;  Laterality: N/A;   ILEOSTOMY CLOSURE N/A 06/01/2017   Procedure: LOOP ILEOSTOMY TAKEDOWN;  Surgeon: Ricarda Frame, MD;  Location: [name];  Service: General;  Laterality: N/A;   IR IMAGING GUIDED PORT INSERTION  06/14/2023   IVC FILTER INSERTION Right 10/2016   IVC FILTER REMOVAL N/A 08/09/2017   Procedure: IVC FILTER REMOVAL;  Surgeon: Renford Dills, MD;  Location: [name];  Service: Cardiovascular;  Laterality: N/A;   KNEE SURGERY Right    torn menicus   LAPAROSCOPY N/A 02/24/2017   Procedure: LAPAROSCOPY DIAGNOSTIC;  Surgeon: Ricarda Frame, MD;  Location: [name];  Service: General;  Laterality: N/A;   LYSIS OF ADHESION N/A 02/24/2017   Procedure: LYSIS OF ADHESION;  Surgeon: Ricarda Frame, MD;  Location: [name];  Service: General;  Laterality: N/A;   PULMONARY VENOGRAPHY N/A 11/19/2016   Procedure: Pulmonary Venography; IVC filter placement; possible pulmonary thrombectomy;  Surgeon: Renford Dills, MD;  Location: [name];  Service: Cardiovascular;  Laterality: N/A;   SACROPLASTY N/A 11/08/2017   Procedure: SACROPLASTY S2;  Surgeon: Kennedy Bucker, MD;  Location: [name];  Service: Orthopedics;  Laterality: N/A;   SIGMOIDOSCOPY N/A 11/13/2016   Procedure: endoscopic  flexible SIGMOIDOSCOPY;  Surgeon: Lattie Haw, MD;  Location: [name];  Service: General;  Laterality: N/A;  SIGMOIDOSCOPY N/A 05/19/2017   Procedure: FLEXIBLE SIGMOIDOSCOPY;  Surgeon: Ricarda Frame, MD;  Location: [name];  Service: General;  Laterality: N/A;   VIDEO BRONCHOSCOPY WITH ENDOBRONCHIAL NAVIGATION N/A 02/23/2021   Procedure: ROBOTIC VIDEO BRONCHOSCOPY WITH ENDOBRONCHIAL NAVIGATION;  Surgeon: [name], MD;  Location: [name];  Service: Pulmonary;  Laterality: N/A;    Social History   Socioeconomic History   Marital status: Married    Spouse name: Broadus John   Number of children: 1   Years of education: Not on  file   Highest education level: Not on file  Occupational History   Not on file  Tobacco Use   Smoking status: Never    Passive exposure: Current (husband smokes)   Smokeless tobacco: Never  Vaping Use   Vaping status: Never Used  Substance and Sexual Activity   Alcohol use: No    Alcohol/week: 0.0 standard drinks of alcohol   Drug use: No   Sexual activity: Never  Other Topics Concern   Not on file  Social History Narrative   Not on file   Social Drivers of Health   Financial Resource Strain: Low Risk  (05/19/2023)   Received from [facility]   Overall Financial Resource Strain (CARDIA)    Difficulty of Paying Living Expenses: Not hard at all  Food Insecurity: No Food Insecurity (05/19/2023)   Received from [facility]   Hunger Vital Sign    Worried About Running Out of Food in the Last Year: Never true    Ran Out of Food in the Last Year: Never true  Transportation Needs: No Transportation Needs (05/19/2023)   Received from [facility] - Transportation    In the past 12 months, has lack of transportation kept you from medical appointments or from getting medications?: No    Lack of Transportation (Non-Medical): No  Physical Activity: Not on file  Stress: Not on file  Social Connections: Not on file  Intimate Partner Violence: Not At Risk (05/09/2023)   Humiliation, Afraid, Rape, and Kick questionnaire    Fear of Current or Ex-Partner: No    Emotionally Abused: No    Physically Abused: No    Sexually Abused: No    Family History  Problem Relation Age of Onset   Diabetes Mother    Heart disease Mother    Cancer Mother    Breast cancer Mother        >50   Heart disease Father    Diabetes Sister    Heart disease Sister      Current Outpatient Medications:    bisacodyl (DULCOLAX) 10 MG suppository, Place 1 suppository (10 mg total) rectally as needed for moderate constipation., Disp: 12  suppository, Rfl: 0   candesartan (ATACAND) 16 MG tablet, Take 16 mg by mouth in the morning., Disp: , Rfl:    cholecalciferol (VITAMIN D) 25 MCG (1000 UNIT) tablet, Take 1,000 Units by mouth in the morning., Disp: , Rfl:    Cyanocobalamin (VITAMIN B-12) 5000 MCG TBDP, Take 5,000 mcg by mouth in the morning., Disp: , Rfl:    cyclobenzaprine (FLEXERIL) 10 MG tablet, Take 10 mg by mouth at bedtime., Disp: , Rfl:    dexamethasone (DECADRON) 4 MG tablet, Take 2 tablets daily for 2 days, start the day after chemotherapy. Take with food., Disp: 30 tablet, Rfl: 1   fluconazole (DIFLUCAN) 100 MG tablet, Take 1 tablet (100 mg total) by mouth daily., Disp: 10 tablet,  Rfl: 0   gabapentin (NEURONTIN) 100 MG capsule, Take 200 mg by mouth with breakfast, with lunch, and with evening meal., Disp: , Rfl:    gabapentin (NEURONTIN) 300 MG capsule, Take 300 mg by mouth at bedtime., Disp: , Rfl:    glipiZIDE (GLUCOTROL) 10 MG tablet, Take 10 mg by mouth daily before breakfast., Disp: , Rfl:    HYDROcodone-acetaminophen (NORCO/VICODIN) 5-325 MG tablet, Take 1 tablet by mouth every 6 (six) hours as needed for moderate pain., Disp: , Rfl:    Magnesium Oxide -Mg Supplement 500 MG TABS, Take 1 tablet by mouth at bedtime., Disp: , Rfl:    ONETOUCH ULTRA TEST test strip, , Disp: , Rfl:    oxybutynin (DITROPAN) 5 MG tablet, Take 1 tablet by mouth 2 (two) times daily., Disp: , Rfl:    potassium chloride SA (KLOR-CON M) 20 MEQ tablet, Take 1 tablet (20 mEq total) by mouth daily., Disp: 30 tablet, Rfl: 1   prochlorperazine (COMPAZINE) 10 MG tablet, Take 1 tablet (10 mg total) by mouth every 6 (six) hours as needed for nausea or vomiting., Disp: 30 tablet, Rfl: 1   rOPINIRole (REQUIP) 1 MG tablet, Take 1 mg by mouth at bedtime., Disp: , Rfl:    sucralfate (CARAFATE) 1 g tablet, Take 1 tablet (1 g total) by mouth 3 (three) times daily with meals. Dissolve tablet in 4 tablesppons warm water swish and swallow 30 min before meals.  (Patient taking differently: Take 1 g by mouth 3 (three) times daily with meals. Dissolve tablet in 4 tablespoons warm water swish and swallow 30 min before meals.), Disp: 90 tablet, Rfl: 0   warfarin (COUMADIN) 6 MG tablet, Take 6 mg by mouth at bedtime., Disp: , Rfl:    Dulaglutide (TRULICITY) 1.5 MG/0.5ML SOAJ, Inject 1.5 mg into the skin once a week. Saturday, Disp: , Rfl:  No current facility-administered medications for this visit.  Facility-Administered Medications Ordered in Other Visits:    0.9 %  sodium chloride infusion, , Intravenous, Continuous, Creig Hines, MD, Last Rate: 10 mL/hr at 07/15/23 1012, New Bag at 07/15/23 1012  Physical exam:  Vitals:   07/29/23 0902  BP: 117/80  Pulse: 90  Resp: 16  Temp: 97.9 F (36.6 C)  TempSrc: Tympanic  SpO2: 98%  Weight: 191 lb (86.6 kg)   Physical Exam Cardiovascular:     Rate and Rhythm: Normal rate and regular rhythm.     Heart sounds: Normal heart sounds.  Pulmonary:     Effort: Pulmonary effort is normal.     Breath sounds: Normal breath sounds.  Abdominal:     General: Bowel sounds are normal.     Palpations: Abdomen is soft.  Skin:    General: Skin is warm and dry.  Neurological:     Mental Status: She is alert and oriented to person, place, and time.         Latest Ref Rng & Units 08/02/2023   11:08 AM  CMP  Glucose 70 - 99 mg/dL 660   BUN 8 - 23 mg/dL 31   Creatinine 6.30 - 1.00 mg/dL 1.60   Sodium 109 - 323 mmol/L 131   Potassium 3.5 - 5.1 mmol/L 3.9   Chloride 98 - 111 mmol/L 101   CO2 22 - 32 mmol/L 21   Calcium 8.9 - 10.3 mg/dL 8.2   Total Protein 6.5 - 8.1 g/dL 5.5   Total Bilirubin 0.0 - 1.2 mg/dL 0.7   Alkaline Phos 38 - 126  U/L 48   AST 15 - 41 U/L 18   ALT 0 - 44 U/L 27       Latest Ref Rng & Units 08/03/2023    5:25 AM  CBC  WBC 4.0 - 10.5 K/uL 3.7   Hemoglobin 12.0 - 15.0 g/dL 25.9   Hematocrit 56.3 - 46.0 % 31.3   Platelets 150 - 400 K/uL 99     No images are attached to the  encounter.  CT ANGIO GI BLEED Addendum Date: 08/02/2023 ADDENDUM REPORT: 08/02/2023 15:41 ADDENDUM: Upon further review there is active arterial extravasation within a loop of bowel in the right lower quadrant at the site of prior distal small bowel anastomosis. Marliss Coots, MD [facility] Rad[facility] Electronically Signed   By: Marliss Coots M.D.   On: 08/02/2023 15:41   Result Date: 08/02/2023 CLINICAL DATA:  73 year old female with history of lower gastrointestinal hemorrhage. EXAM: CTA ABDOMEN AND PELVIS WITHOUT AND WITH CONTRAST TECHNIQUE: Multidetector CT imaging of the abdomen and pelvis was performed using the standard protocol during bolus administration of intravenous contrast. Multiplanar reconstructed images and MIPs were obtained and reviewed to evaluate the vascular anatomy. RADIATION DOSE REDUCTION: This exam was performed according to the departmental dose-optimization program which includes automated exposure control, adjustment of the mA and/or kV according to patient size and/or use of iterative reconstruction technique. CONTRAST:  OMNIPAQUE IOHEXOL 350 MG/ML SOLN COMPARISON:  07/06/2023 FINDINGS: VASCULAR Aorta: Normal caliber aorta without aneurysm, dissection, vasculitis or significant stenosis. Mild scattered atherosclerotic calcifications. Celiac: Patent without evidence of aneurysm, dissection, vasculitis or significant stenosis. SMA: Patent without evidence of aneurysm, dissection, vasculitis or significant stenosis. Renals: Single bilateral renal arteries are patent without evidence of aneurysm, dissection, vasculitis, fibromuscular dysplasia or significant stenosis. IMA: Patent without evidence of aneurysm, dissection, vasculitis or significant stenosis. Inflow: Patent without evidence of aneurysm, dissection, vasculitis or significant stenosis. Proximal Outflow: Bilateral common femoral and visualized portions of the superficial  and profunda femoral arteries are patent without evidence of aneurysm, dissection, vasculitis or significant stenosis. Veins: No obvious venous abnormality within the limitations of this arterial phase study. Review of the MIP images confirms the above findings. NON-VASCULAR Lower chest: No acute abnormality. Hepatobiliary: No focal liver abnormality is seen. Unchanged appearance of multiple peripherally calcified gallstones layering in the gallbladder neck without evidence of gallbladder distension, wall thickening, or pericholecystic fluid. No intra or extrahepatic biliary ductal dilation. Pancreas: Unremarkable. No pancreatic ductal dilatation or surrounding inflammatory changes. Spleen: Normal in size without focal abnormality. Adrenals/Urinary Tract: Adrenal glands are unremarkable. Kidneys are normal, without renal calculi, focal lesion, or hydronephrosis. Bladder is unremarkable. Stomach/Bowel: Stomach is within normal limits. Similar appearing small bowel surgical anastomosis in the anterior lower abdomen without complicating features. Unchanged colo-colonic anastomosis without complicating features at the rectosigmoid junction. No evidence of bowel wall thickening, distention, or inflammatory changes. There is focal hyperenhancement on venous phase only within the lumen of the distal rectum. Lymphatic: No abdominopelvic lymphadenopathy. Reproductive: Uterus and bilateral adnexa are unremarkable. Other: Unchanged supraumbilical hernia containing transverse colon and small bowel without evidence of strangulation or obstruction. Musculoskeletal: No acute osseous abnormality. Similar appearing multilevel degenerative changes of the visualized thoracolumbar spine. IMPRESSION: 1. Venous phase intraluminal extravasation about the distal rectum suggestive of internal hemorrhoidal hemorrhage. No additional evidence of gastrointestinal hemorrhage. 2. Cholelithiasis. 3.  Aortic Atherosclerosis (ICD10-I70.0). Marliss Coots, MD [facility] Radiology Electronically Signed: By: Marliss Coots M.D. On: 08/02/2023  14:12     Assessment and plan- Patient is a 73 y.o. adult with history of stage I right upper lobe lung cancer s/p SBRT in 2022 now with biopsy-proven mediastinal recurrence consistent with adenocarcinoma.  She is here for on treatment assessment prior to cycle 5 of weekly CarboTaxol chemotherapy  Counts okay to proceed with cycle 5 of weekly CarboTaxol chemotherapy today.  She will directly proceed for cycle 6 next week and I will see her back in 2 weeks for cycle 7 which would be her last cycle.  We will plan to repeat CT chest abdomen and pelvis with contrast following that.  If she has stable or partial response she will move on to receive maintenance immunotherapy at that time  Chronic hypokalemia: Continue oral potassium   Visit Diagnosis 1. Encounter for antineoplastic chemotherapy   2. Recurrent non-small cell lung cancer (NSCLC) (HCC)   3. Hypokalemia      Dr. Owens Shark, MD, MPH [facility] [phone] 08/07/2023 6:49 PM

## 2023-08-08 ENCOUNTER — Emergency Department

## 2023-08-08 ENCOUNTER — Other Ambulatory Visit: Payer: Self-pay

## 2023-08-08 ENCOUNTER — Ambulatory Visit

## 2023-08-08 ENCOUNTER — Ambulatory Visit: Payer: PPO

## 2023-08-08 ENCOUNTER — Observation Stay
Admission: EM | Admit: 2023-08-08 | Discharge: 2023-08-09 | Disposition: A | Discharge status: Home / Self Care | Attending: Internal Medicine | Admitting: Internal Medicine

## 2023-08-08 DIAGNOSIS — E1165 Type 2 diabetes mellitus with hyperglycemia: Secondary | ICD-10-CM | POA: Diagnosis not present

## 2023-08-08 DIAGNOSIS — D61818 Other pancytopenia: Secondary | ICD-10-CM | POA: Diagnosis not present

## 2023-08-08 DIAGNOSIS — N3000 Acute cystitis without hematuria: Secondary | ICD-10-CM | POA: Diagnosis not present

## 2023-08-08 DIAGNOSIS — R0602 Shortness of breath: Secondary | ICD-10-CM | POA: Diagnosis not present

## 2023-08-08 DIAGNOSIS — Z86718 Personal history of other venous thrombosis and embolism: Secondary | ICD-10-CM | POA: Insufficient documentation

## 2023-08-08 DIAGNOSIS — J90 Pleural effusion, not elsewhere classified: Secondary | ICD-10-CM | POA: Diagnosis not present

## 2023-08-08 DIAGNOSIS — Z7984 Long term (current) use of oral hypoglycemic drugs: Secondary | ICD-10-CM | POA: Insufficient documentation

## 2023-08-08 DIAGNOSIS — D72819 Decreased white blood cell count, unspecified: Secondary | ICD-10-CM | POA: Diagnosis not present

## 2023-08-08 DIAGNOSIS — Z932 Ileostomy status: Secondary | ICD-10-CM | POA: Diagnosis not present

## 2023-08-08 DIAGNOSIS — Z1152 Encounter for screening for COVID-19: Secondary | ICD-10-CM | POA: Diagnosis not present

## 2023-08-08 DIAGNOSIS — R6510 Systemic inflammatory response syndrome (SIRS) of non-infectious origin without acute organ dysfunction: Secondary | ICD-10-CM

## 2023-08-08 DIAGNOSIS — Z7985 Long-term (current) use of injectable non-insulin antidiabetic drugs: Secondary | ICD-10-CM | POA: Insufficient documentation

## 2023-08-08 DIAGNOSIS — R5383 Other fatigue: Principal | ICD-10-CM

## 2023-08-08 DIAGNOSIS — Z79899 Other long term (current) drug therapy: Secondary | ICD-10-CM | POA: Insufficient documentation

## 2023-08-08 DIAGNOSIS — I509 Heart failure, unspecified: Secondary | ICD-10-CM | POA: Insufficient documentation

## 2023-08-08 DIAGNOSIS — I110 Hypertensive heart disease with heart failure: Secondary | ICD-10-CM | POA: Insufficient documentation

## 2023-08-08 DIAGNOSIS — C3411 Malignant neoplasm of upper lobe, right bronchus or lung: Secondary | ICD-10-CM | POA: Diagnosis not present

## 2023-08-08 DIAGNOSIS — E872 Acidosis, unspecified: Secondary | ICD-10-CM

## 2023-08-08 DIAGNOSIS — Z7901 Long term (current) use of anticoagulants: Secondary | ICD-10-CM | POA: Diagnosis not present

## 2023-08-08 DIAGNOSIS — A419 Sepsis, unspecified organism: Secondary | ICD-10-CM | POA: Diagnosis not present

## 2023-08-08 DIAGNOSIS — C3490 Malignant neoplasm of unspecified part of unspecified bronchus or lung: Secondary | ICD-10-CM | POA: Diagnosis not present

## 2023-08-08 DIAGNOSIS — I82409 Acute embolism and thrombosis of unspecified deep veins of unspecified lower extremity: Secondary | ICD-10-CM | POA: Diagnosis not present

## 2023-08-08 DIAGNOSIS — M79605 Pain in left leg: Secondary | ICD-10-CM | POA: Diagnosis not present

## 2023-08-08 DIAGNOSIS — M79604 Pain in right leg: Secondary | ICD-10-CM | POA: Diagnosis not present

## 2023-08-08 DIAGNOSIS — Z91040 Latex allergy status: Secondary | ICD-10-CM | POA: Diagnosis not present

## 2023-08-08 DIAGNOSIS — E1169 Type 2 diabetes mellitus with other specified complication: Secondary | ICD-10-CM | POA: Diagnosis not present

## 2023-08-08 DIAGNOSIS — Z7722 Contact with and (suspected) exposure to environmental tobacco smoke (acute) (chronic): Secondary | ICD-10-CM | POA: Insufficient documentation

## 2023-08-08 DIAGNOSIS — E669 Obesity, unspecified: Secondary | ICD-10-CM | POA: Diagnosis not present

## 2023-08-08 DIAGNOSIS — R42 Dizziness and giddiness: Secondary | ICD-10-CM | POA: Diagnosis not present

## 2023-08-08 DIAGNOSIS — R918 Other nonspecific abnormal finding of lung field: Secondary | ICD-10-CM | POA: Diagnosis not present

## 2023-08-08 DIAGNOSIS — R0609 Other forms of dyspnea: Secondary | ICD-10-CM | POA: Diagnosis not present

## 2023-08-08 DIAGNOSIS — D849 Immunodeficiency, unspecified: Secondary | ICD-10-CM | POA: Diagnosis not present

## 2023-08-08 DIAGNOSIS — I10 Essential (primary) hypertension: Secondary | ICD-10-CM | POA: Diagnosis present

## 2023-08-08 DIAGNOSIS — R6511 Systemic inflammatory response syndrome (SIRS) of non-infectious origin with acute organ dysfunction: Secondary | ICD-10-CM | POA: Insufficient documentation

## 2023-08-08 DIAGNOSIS — Z86711 Personal history of pulmonary embolism: Secondary | ICD-10-CM | POA: Diagnosis not present

## 2023-08-08 DIAGNOSIS — R531 Weakness: Secondary | ICD-10-CM | POA: Diagnosis present

## 2023-08-08 DIAGNOSIS — Z933 Colostomy status: Secondary | ICD-10-CM | POA: Diagnosis not present

## 2023-08-08 LAB — COMPREHENSIVE METABOLIC PANEL
ALT: 37 U/L (ref 0–44)
AST: 30 U/L (ref 15–41)
Albumin: 2.9 g/dL — ABNORMAL LOW (ref 3.5–5.0)
Alkaline Phosphatase: 44 U/L (ref 38–126)
Anion gap: 10 (ref 5–15)
BUN: 14 mg/dL (ref 8–23)
CO2: 20 mmol/L — ABNORMAL LOW (ref 22–32)
Calcium: 8.1 mg/dL — ABNORMAL LOW (ref 8.9–10.3)
Chloride: 104 mmol/L (ref 98–111)
Creatinine, Ser: 0.77 mg/dL (ref 0.44–1.00)
GFR, Estimated: >60 mL/min (ref >60)
Glucose, Bld: 449 mg/dL — ABNORMAL HIGH (ref 70–99)
Potassium: 4.1 mmol/L (ref 3.5–5.1)
Sodium: 134 mmol/L — ABNORMAL LOW (ref 135–145)
Total Bilirubin: 0.6 mg/dL (ref 0.0–1.2)
Total Protein: 5.7 g/dL — ABNORMAL LOW (ref 6.5–8.1)

## 2023-08-08 LAB — CBC WITH DIFFERENTIAL/PLATELET
Abs Immature Granulocytes: 0.03 10*3/uL (ref 0.00–0.07)
Basophils Absolute: 0 10*3/uL (ref 0.0–0.1)
Basophils Relative: 0 %
Eosinophils Absolute: 0 10*3/uL (ref 0.0–0.5)
Eosinophils Relative: 0 %
HCT: 29.8 % — ABNORMAL LOW (ref 36.0–46.0)
Hemoglobin: 9.9 g/dL — ABNORMAL LOW (ref 12.0–15.0)
Immature Granulocytes: 1 %
Lymphocytes Relative: 11 %
Lymphs Abs: 0.2 10*3/uL — ABNORMAL LOW (ref 0.7–4.0)
MCH: 29.6 pg (ref 26.0–34.0)
MCHC: 33.2 g/dL (ref 30.0–36.0)
MCV: 89 fL (ref 80.0–100.0)
Monocytes Absolute: 0.3 10*3/uL (ref 0.1–1.0)
Monocytes Relative: 12 %
Neutro Abs: 1.7 10*3/uL (ref 1.7–7.7)
Neutrophils Relative %: 76 %
Platelets: 115 10*3/uL — ABNORMAL LOW (ref 150–400)
RBC: 3.35 MIL/uL — ABNORMAL LOW (ref 3.87–5.11)
RDW: 19.2 % — ABNORMAL HIGH (ref 11.5–15.5)
WBC: 2.2 10*3/uL — ABNORMAL LOW (ref 4.0–10.5)
nRBC: 0 % (ref 0.0–0.2)

## 2023-08-08 LAB — LACTIC ACID, PLASMA
Lactic Acid, Venous: 3.4 mmol/L (ref 0.5–1.9)
Lactic Acid, Venous: 1.9 mmol/L (ref 0.5–1.9)

## 2023-08-08 LAB — URINALYSIS, W/ REFLEX TO CULTURE (INFECTION SUSPECTED)
Bacteria, UA: NONE SEEN
Bilirubin Urine: NEGATIVE
Glucose, UA: >=500 mg/dL — AB
Hgb urine dipstick: NEGATIVE
Ketones, ur: 5 mg/dL — AB
Leukocytes,Ua: NEGATIVE
Nitrite: POSITIVE — AB
Protein, ur: NEGATIVE mg/dL
Specific Gravity, Urine: 1.031 — ABNORMAL HIGH (ref 1.005–1.030)
pH: 6 (ref 5.0–8.0)

## 2023-08-08 LAB — CBG MONITORING, ED
Glucose-Capillary: 268 mg/dL — ABNORMAL HIGH (ref 70–99)
Glucose-Capillary: 272 mg/dL — ABNORMAL HIGH (ref 70–99)
Glucose-Capillary: 257 mg/dL — ABNORMAL HIGH (ref 70–99)

## 2023-08-08 LAB — TROPONIN I (HIGH SENSITIVITY)
Troponin I (High Sensitivity): 5 ng/L (ref <18)
Troponin I (High Sensitivity): 5 ng/L (ref <18)

## 2023-08-08 LAB — RESP PANEL BY RT-PCR (RSV, FLU A&B, COVID)  RVPGX2
Influenza A by PCR: NEGATIVE
Influenza B by PCR: NEGATIVE
Resp Syncytial Virus by PCR: NEGATIVE
SARS Coronavirus 2 by RT PCR: NEGATIVE

## 2023-08-08 LAB — T4, FREE: Free T4: 1.05 ng/dL (ref 0.61–1.12)

## 2023-08-08 LAB — PROTIME-INR
INR: 1.1 (ref 0.8–1.2)
Prothrombin Time: 14.1 s (ref 11.4–15.2)

## 2023-08-08 LAB — TSH: TSH: 0.327 u[IU]/mL — ABNORMAL LOW (ref 0.350–4.500)

## 2023-08-08 MED ORDER — ROPINIROLE HCL 1 MG PO TABS
1.0000 mg | ORAL_TABLET | Freq: Every day | ORAL | Status: DC
  Administered 2023-08-08: 1 mg via ORAL
  Filled 2023-08-08: qty 1

## 2023-08-08 MED ORDER — SODIUM CHLORIDE 0.9 % IV SOLN
2.0000 g | Freq: Once | INTRAVENOUS | Status: AC
  Administered 2023-08-08: 2 g via INTRAVENOUS
  Filled 2023-08-08: qty 12.5

## 2023-08-08 MED ORDER — SODIUM CHLORIDE 0.9 % IV BOLUS
500.0000 mL | Freq: Once | INTRAVENOUS | Status: AC
  Administered 2023-08-08: 500 mL via INTRAVENOUS

## 2023-08-08 MED ORDER — CYCLOBENZAPRINE HCL 10 MG PO TABS
10.0000 mg | ORAL_TABLET | Freq: Every day | ORAL | Status: DC
  Administered 2023-08-08: 10 mg via ORAL
  Filled 2023-08-08: qty 1

## 2023-08-08 MED ORDER — ONDANSETRON HCL 4 MG/2ML IJ SOLN: 4.0000 mg | Freq: Three times a day (TID) | INTRAMUSCULAR | Status: DC | PRN

## 2023-08-08 MED ORDER — GABAPENTIN 300 MG PO CAPS
300.0000 mg | ORAL_CAPSULE | Freq: Every day | ORAL | Status: DC
  Administered 2023-08-08: 300 mg via ORAL
  Filled 2023-08-08: qty 1

## 2023-08-08 MED ORDER — WARFARIN SODIUM 6 MG PO TABS
9.0000 mg | ORAL_TABLET | Freq: Once | ORAL | Status: AC
  Administered 2023-08-08: 9 mg via ORAL
  Filled 2023-08-08: qty 1

## 2023-08-08 MED ORDER — LIDOCAINE VISCOUS HCL 2 % MT SOLN
15.0000 mL | Freq: Once | OROMUCOSAL | Status: AC
Start: 2023-08-08 — End: 2023-08-08
  Administered 2023-08-08: 15 mL via ORAL
  Filled 2023-08-08: qty 15

## 2023-08-08 MED ORDER — INSULIN ASPART 100 UNIT/ML IJ SOLN
0.0000 [IU] | Freq: Three times a day (TID) | INTRAMUSCULAR | Status: DC
  Administered 2023-08-08 (×2): 8 [IU] via SUBCUTANEOUS
  Administered 2023-08-09: 3 [IU] via SUBCUTANEOUS
  Filled 2023-08-08 (×3): qty 1

## 2023-08-08 MED ORDER — VANCOMYCIN HCL 1500 MG/300ML IV SOLN
1500.0000 mg | INTRAVENOUS | Status: DC
  Administered 2023-08-08: 1500 mg via INTRAVENOUS
  Filled 2023-08-08: qty 300

## 2023-08-08 MED ORDER — IOHEXOL 350 MG/ML SOLN
75.0000 mL | Freq: Once | INTRAVENOUS | Status: AC | PRN
  Administered 2023-08-08: 75 mL via INTRAVENOUS

## 2023-08-08 MED ORDER — SODIUM CHLORIDE 0.9 % IV BOLUS
1000.0000 mL | Freq: Once | INTRAVENOUS | Status: AC
  Administered 2023-08-08: 1000 mL via INTRAVENOUS

## 2023-08-08 MED ORDER — ONDANSETRON HCL 4 MG PO TABS: 4.0000 mg | ORAL_TABLET | Freq: Four times a day (QID) | ORAL | Status: DC | PRN

## 2023-08-08 MED ORDER — HYDROCODONE-ACETAMINOPHEN 5-325 MG PO TABS
1.0000 | ORAL_TABLET | Freq: Four times a day (QID) | ORAL | Status: DC | PRN
  Administered 2023-08-08: 1 via ORAL
  Filled 2023-08-08: qty 1

## 2023-08-08 MED ORDER — IRBESARTAN 150 MG PO TABS
300.0000 mg | ORAL_TABLET | Freq: Every day | ORAL | Status: DC
  Administered 2023-08-08: 300 mg via ORAL
  Filled 2023-08-08 (×2): qty 2

## 2023-08-08 MED ORDER — CEFEPIME HCL 2 G IV SOLR
2.0000 g | Freq: Three times a day (TID) | INTRAVENOUS | Status: DC
  Administered 2023-08-08 – 2023-08-09 (×3): 2 g via INTRAVENOUS
  Filled 2023-08-08 (×3): qty 12.5

## 2023-08-08 MED ORDER — ONDANSETRON HCL 4 MG/2ML IJ SOLN: 4.0000 mg | Freq: Four times a day (QID) | INTRAMUSCULAR | Status: DC | PRN

## 2023-08-08 MED ORDER — LACTATED RINGERS IV SOLN
150.0000 mL/h | INTRAVENOUS | Status: DC
  Administered 2023-08-08 – 2023-08-09 (×3): 150 mL/h via INTRAVENOUS

## 2023-08-08 MED ORDER — FAMOTIDINE 20 MG PO TABS
20.0000 mg | ORAL_TABLET | Freq: Once | ORAL | Status: AC
  Administered 2023-08-08: 20 mg via ORAL
  Filled 2023-08-08: qty 1

## 2023-08-08 MED ORDER — SUCRALFATE 1 G PO TABS
1.0000 g | ORAL_TABLET | Freq: Three times a day (TID) | ORAL | Status: DC
  Administered 2023-08-09: 1 g via ORAL
  Filled 2023-08-08: qty 1

## 2023-08-08 MED ORDER — OXYBUTYNIN CHLORIDE 5 MG PO TABS
5.0000 mg | ORAL_TABLET | Freq: Two times a day (BID) | ORAL | Status: DC
  Administered 2023-08-08 – 2023-08-09 (×2): 5 mg via ORAL
  Filled 2023-08-08 (×2): qty 1

## 2023-08-08 MED ORDER — ACETAMINOPHEN 325 MG PO TABS
650.0000 mg | ORAL_TABLET | Freq: Four times a day (QID) | ORAL | Status: DC | PRN
  Administered 2023-08-08: 650 mg via ORAL
  Filled 2023-08-08: qty 2

## 2023-08-08 MED ORDER — WARFARIN - PHARMACIST DOSING INPATIENT
Freq: Every day | Status: DC
  Filled 2023-08-08: qty 1

## 2023-08-08 MED ORDER — ALUM & MAG HYDROXIDE-SIMETH 200-200-20 MG/5ML PO SUSP
30.0000 mL | Freq: Once | ORAL | Status: AC
  Administered 2023-08-08: 30 mL via ORAL
  Filled 2023-08-08: qty 30

## 2023-08-08 MED ORDER — ENOXAPARIN SODIUM 100 MG/ML IJ SOSY
1.0000 mg/kg | PREFILLED_SYRINGE | Freq: Two times a day (BID) | INTRAMUSCULAR | Status: DC
  Administered 2023-08-08 – 2023-08-09 (×2): 87.5 mg via SUBCUTANEOUS
  Filled 2023-08-08 (×3): qty 1

## 2023-08-08 MED ORDER — VANCOMYCIN HCL 2000 MG/400ML IV SOLN
2000.0000 mg | Freq: Once | INTRAVENOUS | Status: AC
  Administered 2023-08-08: 2000 mg via INTRAVENOUS
  Filled 2023-08-08: qty 400

## 2023-08-08 NOTE — ED Notes (Signed)
 Patient had a small bowel movement in the bedside commode. Patient was placed in a clean brief and returned to bed. Patient denies needs at this time. Bed alarm activated.

## 2023-08-08 NOTE — Assessment & Plan Note (Signed)
 Marland Kitchen

## 2023-08-08 NOTE — H&P (Addendum)
 History and Physical    Patient: Kara [name] XBM:[id number] DOB: 28-May-1950 DOA: 08/08/2023 DOS: the patient was seen and examined on 08/08/2023 PCP: Barbette Reichmann, MD  Patient coming from: Home  Chief Complaint: No chief complaint on file.  HPI: Kara [name] is a 73 y.o. 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  Patient reports generalized malaise over the past 24 hours.  No fevers chills.  No headache.  No cough.  Minimal abdominal pain.  No focal hemiparesis or confusion.  Mild dizziness x 1 week.  Followed by Dr. Smith Robert outpatient in the setting of recurrent adenocarcinoma.  Currently on chemotherapy and radiation.  Symptoms have persisted over the course of the past 3 to 4 days.  No reported alcohol tobacco use.  No reported recent significant medication changes.  Noted admission March 11 March 12 for issues including hemorrhagic shock and acute GI bleeding.  Has resumed Coumadin per hospital discharge.  No reported episodes of GI bleeding since discharge. Presented to the ER afebrile, heart rate in the 100s, respirations into the 20s, BP stable.  Satting well on room air.  White count 2.2, hemoglobin 9.9, platelets 115, lactate 3.4-1.9 troponin within normal limits.  Urinalysis minimally indicative of infection.  COVID flu and RSV negative.  Creatinine 0.77.  Glucose 449.  Lower extremity ultrasound as well as CTA of the chest negative for PE/DVT or acute abnormalities. Review of Systems: As mentioned in the history of present illness. All other systems reviewed and are negative. 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, 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   CHF (congestive heart failure) (HCC)    followed colon resection, "wouldn't let me get up"    Chronic anticoagulation    Degenerative disc disease, lumbar    Diverticulitis 2018   Diverticulosis    DVT of lower extremity, bilateral (HCC) 2018   Essential hypertension    History of being hospitalized    1 month ago for 3 days, vomiting, and kink in upper intestine   History of hiatal hernia    Mediastinal adenopathy 04/2023   MRSA nasal colonization    Non-small cell lung cancer (HCC) 2022   right   Obesity (BMI 30-39.9)    Osteoarthritis    Osteoporosis    PE (pulmonary thromboembolism) (HCC) 2018   Pneumonia    Small bowel obstruction (HCC) 03/20/2023   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HCC)    Type 2 diabetes mellitus with obesity (HCC)    Vertigo    Past Surgical History:  Procedure Laterality Date   APPENDECTOMY N/A 02/24/2017   Procedure: Incidental  APPENDECTOMY;  Surgeon: Ricarda Frame, MD;  Location: [name];  Service: General;  Laterality: N/A;   CARPAL TUNNEL RELEASE Bilateral    CATARACT EXTRACTION W/PHACO Left 11/21/2018   Procedure: CATARACT EXTRACTION PHACO AND INTRAOCULAR LENS PLACEMENT (IOC) LEFT DIABETES;  Surgeon: Galen Manila, MD;  Location: [facility];  Service: Ophthalmology;  Laterality: Left;  latex sensitivity Diabetic - oral meds   CATARACT EXTRACTION W/PHACO Right 12/12/2018   Procedure: CATARACT EXTRACTION PHACO AND INTRAOCULAR LENS PLACEMENT (IOC) RIGHT DIABETES;  Surgeon: Galen Manila, MD;  Location: [facility];  Service: Ophthalmology;  Laterality: Right;  Diabetes-oral med Latex sensitiviy   COLON RESECTION SIGMOID N/A 11/02/2016  Procedure: COLON RESECTION SIGMOID;  Surgeon: Ricarda Frame, MD;  Location: [name];  Service: General;  Laterality: N/A;   COLON SURGERY  11/02/2016   COLONOSCOPY WITH PROPOFOL N/A 02/03/2017   Procedure: COLONOSCOPY WITH PROPOFOL;  Surgeon: Toney Reil, MD;  Location: [facility];  Service: Gastroenterology;  Laterality: N/A;   COLOSTOMY N/A 11/02/2016    Procedure: COLOSTOMY;  Surgeon: Ricarda Frame, MD;  Location: [name];  Service: General;  Laterality: N/A;   COLOSTOMY REVERSAL N/A 02/24/2017   Procedure: COLOSTOMY REVERSAL, ostomy takedown, spleenic flexure mobilization, excision rectal stump/distal sigmoid, anastomosis with suture reinforcement;  Surgeon: Ricarda Frame, MD;  Location: [name];  Service: General;  Laterality: N/A;   ENDOBRONCHIAL ULTRASOUND N/A 05/16/2023   Procedure: ENDOBRONCHIAL ULTRASOUND;  Surgeon: [name], MD;  Location: [name];  Service: Cardiopulmonary;  Laterality: N/A;   FLEXIBLE BRONCHOSCOPY N/A 05/16/2023   Procedure: FLEXIBLE BRONCHOSCOPY;  Surgeon: [name], MD;  Location: [name];  Service: Cardiopulmonary;  Laterality: N/A;   ILEO LOOP DIVERSION N/A 02/24/2017   Procedure: ILEO LOOP COLOSTOMY;  Surgeon: Ricarda Frame, MD;  Location: [name];  Service: General;  Laterality: N/A;   ILEOSTOMY CLOSURE N/A 06/01/2017   Procedure: LOOP ILEOSTOMY TAKEDOWN;  Surgeon: Ricarda Frame, MD;  Location: [name];  Service: General;  Laterality: N/A;   IR IMAGING GUIDED PORT INSERTION  06/14/2023   IVC FILTER INSERTION Right 10/2016   IVC FILTER REMOVAL N/A 08/09/2017   Procedure: IVC FILTER REMOVAL;  Surgeon: Renford Dills, MD;  Location: [name];  Service: Cardiovascular;  Laterality: N/A;   KNEE SURGERY Right    torn menicus   LAPAROSCOPY N/A 02/24/2017   Procedure: LAPAROSCOPY DIAGNOSTIC;  Surgeon: Ricarda Frame, MD;  Location: [name];  Service: General;  Laterality: N/A;   LYSIS OF ADHESION N/A 02/24/2017   Procedure: LYSIS OF ADHESION;  Surgeon: Ricarda Frame, MD;  Location: [name];  Service: General;  Laterality: N/A;   PULMONARY VENOGRAPHY N/A 11/19/2016   Procedure: Pulmonary Venography; IVC filter placement; possible pulmonary thrombectomy;  Surgeon: Renford Dills, MD;  Location: [name];  Service: Cardiovascular;  Laterality: N/A;    SACROPLASTY N/A 11/08/2017   Procedure: SACROPLASTY S2;  Surgeon: Kennedy Bucker, MD;  Location: [name];  Service: Orthopedics;  Laterality: N/A;   SIGMOIDOSCOPY N/A 11/13/2016   Procedure: endoscopic  flexible SIGMOIDOSCOPY;  Surgeon: Lattie Haw, MD;  Location: [name];  Service: General;  Laterality: N/A;   SIGMOIDOSCOPY N/A 05/19/2017   Procedure: Arnell Sieving;  Surgeon: Ricarda Frame, MD;  Location: [name];  Service: General;  Laterality: N/A;   VIDEO BRONCHOSCOPY WITH ENDOBRONCHIAL NAVIGATION N/A 02/23/2021   Procedure: ROBOTIC VIDEO BRONCHOSCOPY WITH ENDOBRONCHIAL NAVIGATION;  Surgeon: [name], MD;  Location: [name];  Service: Pulmonary;  Laterality: N/A;   Social History:  reports that she has never smoked. She has been exposed to tobacco smoke. She has never used smokeless tobacco. She reports that she does not drink alcohol and does not use drugs.  Allergies  Allergen Reactions   Adhesive [Tape] Other (See Comments)    Pt reports, when removing tape from skin most tapes pull her skin off. Ok to use paper tape   Morphine And Codeine Nausea And Vomiting   Other Hives    Chlorhexadine wipes/CHG wipes,    Latex Rash    Exam gloves/ does not react around elastic and lips don't swell when blowing balloons    Family History  Problem Relation Age of Onset   Diabetes Mother    Heart disease Mother    Cancer Mother    Breast cancer Mother        >50   Heart disease Father    Diabetes Sister    Heart disease Sister     Prior to Admission medications   Medication Sig Start Date End Date Taking? Authorizing Provider  bisacodyl (DULCOLAX) 10 MG suppository Place 1 suppository (10 mg total) rectally as needed for moderate constipation. 07/06/23   Sharman Cheek, MD  candesartan (ATACAND) 16 MG tablet Take 16 mg by mouth in the morning. 08/03/17   [provider]  cholecalciferol (VITAMIN D) 25 MCG (1000 UNIT) tablet Take 1,000 Units by  mouth in the morning.    [provider]  Cyanocobalamin (VITAMIN B-12) 5000 MCG TBDP Take 5,000 mcg by mouth in the morning.    [provider]  cyclobenzaprine (FLEXERIL) 10 MG tablet Take 10 mg by mouth at bedtime. 07/05/16   [provider]  dexamethasone (DECADRON) 4 MG tablet Take 2 tablets daily for 2 days, start the day after chemotherapy. Take with food. 05/27/23   Michaelyn Barter, MD  Dulaglutide (TRULICITY) 1.5 MG/0.5ML SOAJ Inject 1.5 mg into the skin once a week. Saturday 07/28/23 07/27/24  [provider]  fluconazole (DIFLUCAN) 100 MG tablet Take 1 tablet (100 mg total) by mouth daily. 07/26/23   Carmina Miller, MD  gabapentin (NEURONTIN) 100 MG capsule Take 200 mg by mouth with breakfast, with lunch, and with evening meal.    [provider]  gabapentin (NEURONTIN) 300 MG capsule Take 300 mg by mouth at bedtime.    [provider]  glipiZIDE (GLUCOTROL) 10 MG tablet Take 10 mg by mouth daily before breakfast.    [provider]  HYDROcodone-acetaminophen (NORCO/VICODIN) 5-325 MG tablet Take 1 tablet by mouth every 6 (six) hours as needed for moderate pain.    [provider]  Magnesium Oxide -Mg Supplement 500 MG TABS Take 1 tablet by mouth at bedtime.    [provider]  Koren Bound TEST test strip  01/25/23   [provider]  oxybutynin (DITROPAN) 5 MG tablet Take 1 tablet by mouth 2 (two) times daily. 02/25/22   [provider]  potassium chloride SA (KLOR-CON M) 20 MEQ tablet Take 1 tablet (20 mEq total) by mouth daily. 07/22/23   Creig Hines, MD  prochlorperazine (COMPAZINE) 10 MG tablet Take 1 tablet (10 mg total) by mouth every 6 (six) hours as needed for nausea or vomiting. 05/27/23   Michaelyn Barter, MD  rOPINIRole (REQUIP) 1 MG tablet Take 1 mg by mouth at bedtime. 06/22/23   [provider]  sucralfate (CARAFATE) 1 g tablet Take 1 tablet (1 g total) by mouth 3 (three) times  daily with meals. Dissolve tablet in 4 tablesppons warm water swish and swallow 30 min before meals. Patient taking differently: Take 1 g by mouth 3 (three) times daily with meals. Dissolve tablet in 4 tablespoons warm water swish and swallow 30 min before meals. 07/22/23 08/21/23  Carmina Miller, MD  warfarin (COUMADIN) 6 MG tablet Take 6 mg by mouth at bedtime. 06/01/23   [provider]    Physical Exam: Vitals:   08/08/23 0345 08/08/23 0600 08/08/23 0737 08/08/23 0932  BP:  119/62  (!) 153/88  Pulse: 98 (!) 102  100  Resp:  (!) 26  (!) 24  Temp:   97.7 F (36.5 C)  TempSrc:   Oral   SpO2: 98% 97%  100%  Weight:      Height:       Physical Exam Constitutional:      Appearance: She is obese.  HENT:     Head: Normocephalic and atraumatic.     Mouth/Throat:     Mouth: Mucous membranes are moist.  Eyes:     Pupils: Pupils are equal, round, and reactive to light.  Cardiovascular:     Rate and Rhythm: Normal rate and regular rhythm.  Pulmonary:     Effort: Pulmonary effort is normal.  Abdominal:     General: Bowel sounds are normal.  Musculoskeletal:        General: Normal range of motion.  Skin:    General: Skin is warm.  Neurological:     General: No focal deficit present.  Psychiatric:        Mood and Affect: Mood normal.     Data Reviewed:  There are no new results to review at this time.  CT Angio Chest PE W/Cm &/Or Wo Cm CLINICAL DATA:  Dizziness, weakness, history of lung cancer,  EXAM: CT ANGIOGRAPHY CHEST WITH CONTRAST  TECHNIQUE: Multidetector CT imaging of the chest was performed using the standard protocol during bolus administration of intravenous contrast. Multiplanar CT image reconstructions and MIPs were obtained to evaluate the vascular anatomy.  RADIATION DOSE REDUCTION: This exam was performed according to the departmental dose-optimization program which includes automated exposure control, adjustment of the mA and/or kV according  to patient size and/or use of iterative reconstruction technique.  CONTRAST:  75mL OMNIPAQUE IOHEXOL 350 MG/ML SOLN  COMPARISON:  PET/CT 06/03/2023 and radiograph 05/16/2023  FINDINGS: Cardiovascular: No central pulmonary embolism. Question nonocclusive filling defects within a segmental branch the right lower lobe pulmonary arteries (circa series 4/image 73). However evaluation is limited by significant artifact in this area. No pericardial effusion. Mild aortic atherosclerotic calcification. Right chest wall Port-A-Cath.  Mediastinum/Nodes: Bowing of the posterior trachea compatible with expiratory phase. Esophagus is unremarkable. 11 mm subcarinal lymph node is unchanged from 06/03/2023 PET/CT.  Lungs/Pleura: Unchanged right hilar mass/consolidation. This was hypermetabolic on PET/CT 06/03/2023. Mild mosaic attenuation of the lungs compatible with air trapping. Small right pleural effusion. No pneumothorax.  Upper Abdomen: Cholelithiasis. No evidence of acute cholecystitis. Hepatic steatosis. No acute abnormality.  Musculoskeletal: No acute fracture.  Review of the MIP images confirms the above findings.  IMPRESSION: 1. No central pulmonary embolism. Possible filling defects within a segmental branch of the right lower lobe pulmonary arteries is favored artifactual given significant motion in this area nonocclusive pulmonary embolism is considered less likely though unable to be excluded. 2. Unchanged right hilar mass/consolidation. This was hypermetabolic on PET/CT 06/03/2023. 3. Small right pleural effusion. 4. Mosaic attenuation of the lungs compatible with air trapping. 5. Cholelithiasis. 6. Hepatic steatosis. 7. Aortic Atherosclerosis (ICD10-I70.0).  Electronically Signed   By: Minerva Fester M.D.   On: 08/08/2023 03:23 US Venous Img Lower Bilateral CLINICAL DATA:  Lower extremity pain  EXAM: Bilateral  LOWER EXTREMITY VENOUS DOPPLER  ULTRASOUND  TECHNIQUE: Gray-scale sonography with compression, as well as color and duplex ultrasound, were performed to evaluate the deep venous system(s) from the level of the common femoral vein through the popliteal and proximal calf veins.  COMPARISON:  ultrasound 04/10/2018  FINDINGS: VENOUS  Normal compressibility of the common femoral, superficial femoral, and popliteal veins, as well as the visualized calf veins. Visualized portions of profunda femoral vein and great saphenous  vein unremarkable. No filling defects to suggest DVT on grayscale or color Doppler imaging. Doppler waveforms show normal direction of venous flow, normal respiratory plasticity and response to augmentation.  Limited views of the contralateral common femoral vein are unremarkable.  OTHER  None.  Limitations: none  IMPRESSION: Negative.  Electronically Signed   By: Minerva Fester M.D.   On: 08/08/2023 02:10  Lab Results  Component Value Date   WBC 2.2 (L) 08/08/2023   HGB 9.9 (L) 08/08/2023   HCT 29.8 (L) 08/08/2023   MCV 89.0 08/08/2023   PLT 115 (L) 08/08/2023   Last metabolic panel Lab Results  Component Value Date   GLUCOSE 449 (H) 08/08/2023   NA 134 (L) 08/08/2023   K 4.1 08/08/2023   CL 104 08/08/2023   CO2 20 (L) 08/08/2023   BUN 14 08/08/2023   CREATININE 0.77 08/08/2023   GFRNONAA >60 08/08/2023   CALCIUM 8.1 (L) 08/08/2023   PHOS 4.0 03/22/2023   PROT 5.7 (L) 08/08/2023   ALBUMIN 2.9 (L) 08/08/2023   BILITOT 0.6 08/08/2023   ALKPHOS 44 08/08/2023   AST 30 08/08/2023   ALT 37 08/08/2023   ANIONGAP 10 08/08/2023    Assessment and Plan: * Sepsis (HCC) Meeting sepsis criteria w/ HR 100s, WBC 2.2 No overt source of infection at present  Noted active chemotherapy in setting of lung cancer Will place on IV cefepime and vancomycin for empiric coverage  Panculture  Monitor    Chronic anticoagulation (COUMADIN) Noted recent episode of GI bleeding now  resolved Resume Coumadin per pharmacy in the setting history of DVT and PE as well as active malignancy May benefit from risk/benefit discussion regarding warfarin, and consideration of Eliquis/Xarelto  Monitor  Recurrent non-small cell lung cancer (NSCLC) (HCC) History of stage I adenocarcinoma of the right upper lung with recurrence in the mediastinum currently on radiation and chemotherapy.   Essential hypertension BP stable  Titrate home regimen    Type 2 diabetes mellitus with obesity (HCC) Blood sugars 400s  SSI  A1C  Monitor       Advance Care Planning:   Code Status: Full Code   Consults: None   Family Communication: No family at the bedside   Severity of Illness: The appropriate patient status for this patient is OBSERVATION. Observation status is judged to be reasonable and necessary in order to provide the required intensity of service to ensure the patient's safety. The patient's presenting symptoms, physical exam findings, and initial radiographic and laboratory data in the context of their medical condition is felt to place them at decreased risk for further clinical deterioration. Furthermore, it is anticipated that the patient will be medically stable for discharge from the hospital within 2 midnights of admission.   Author: Floydene Flock, MD 08/08/2023 10:36 AM  For on call review [email or web address].

## 2023-08-08 NOTE — Progress Notes (Signed)
 PHARMACY - ANTICOAGULATION CONSULT NOTE  Pharmacy Consult for warfarin Indication:  Hx of DVT  Allergies  Allergen Reactions   Adhesive [Tape] Other (See Comments)    Pt reports, when removing tape from skin most tapes pull her skin off. Ok to use paper tape   Morphine And Codeine Nausea And Vomiting   Other Hives    Chlorhexadine wipes/CHG wipes,    Latex Rash    Exam gloves/ does not react around elastic and lips don't swell when blowing balloons    Patient Measurements: Height: 5\' 3"  (160 cm) Weight: 86.6 kg (191 lb) IBW/kg (Calculated) : 52.4  Vital Signs: Temp: 97.8 F (36.6 C) (03/17 1607) Temp Source: Oral (03/17 1135) BP: 155/77 (03/17 1900) Pulse Rate: 94 (03/17 1915)  Labs: Recent Labs    08/08/23 0043 08/08/23 0307  HGB 9.9*  --   HCT 29.8*  --   PLT 115*  --   LABPROT 14.1  --   INR 1.1  --   CREATININE 0.77  --   TROPONINIHS 5 5    Estimated Creatinine Clearance (by C-G formula based on SCr of 0.77 mg/dL) Female: 47.8 mL/min Female: 82.4 mL/min   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, 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   CHF (congestive heart failure) (HCC)    followed colon resection, "wouldn't let me get up"   Chronic anticoagulation    Degenerative disc disease, lumbar    Diverticulitis 2018   Diverticulosis    DVT of lower extremity, bilateral (HCC) 2018   Essential hypertension    History of being hospitalized    1 month ago for 3 days, vomiting, and kink in upper intestine   History of hiatal hernia    Mediastinal adenopathy 04/2023   MRSA nasal colonization    Non-small cell lung cancer (HCC) 2022   right   Obesity (BMI 30-39.9)    Osteoarthritis    Osteoporosis    PE (pulmonary thromboembolism) (HCC) 2018   Pneumonia    Small bowel obstruction (HCC) 03/20/2023   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HCC)    Type 2 diabetes mellitus with obesity (HCC)     Vertigo    Assessment: 73 y/o female presenting with general malaise. 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 Pharmacy has been consulted to resume PTA warfarin.  PTA warfarin regimen per patient: 6 mg daily (weekly dose of 42 mg) Last warfarin dose: 6 mg on 08/07/23 **Of note, patient's INR was elevated at 4.2 at office visit on 07/26/23, and, per chart notes, provider instructed patient to adjust weekly regimen to 5 mg on Sat/Sun, then 6 mg all other days (weekly dose of 40 mg). Patient states she should still be taking 6 mg daily.  Baseline labs: INR 1.1, PT 14.1, hgb 9.9, plt 115  Noted DDI:  Ropinirole: may increase the hypoprothrombinemic effects of warfarin; may require a reduction in total weekly warfarin dose  Goal of Therapy:  INR 2-3 Monitor platelets by anticoagulation protocol: Yes   Plan:  Give warfarin 9 mg x1 tonight (1.5x PTA dose) Start enoxaparin 80 mg SQ BID and continue to bridge until INR > 2 Monitor INR and CBC daily  Thank you for involving pharmacy in this patient's care.   Rockwell Alexandria, PharmD Clinical Pharmacist 08/08/2023 7:41  PM

## 2023-08-08 NOTE — ED Notes (Signed)
 Patient assisted to restroom at this time. Pt placed in new brief. States some dyspnea with exertion. NAD noted.

## 2023-08-08 NOTE — Consult Note (Signed)
 Pharmacy Antibiotic Note  ASSESSMENT: 73 y.o. adult with PMH including lung cancer and recent blood transfusion is presenting with sepsis. She endorses dizziness and weakness upon standing for the past week. Chest CT exhibits small pleual effusions, air trapping, and hypermetabolic right hilar mass. Pharmacy has been consulted to manage cefepime and vancomycin dosing.  Patient measurements: Height: 5\' 3"  (160 cm) Weight: 86.6 kg (191 lb) IBW/kg (Calculated) : 52.4  Vital signs: Temp: 97.7 F (36.5 C) (03/17 0737) Temp Source: Oral (03/17 0737) BP: 153/88 (03/17 0932) Pulse Rate: 100 (03/17 0932) Recent Labs  Lab 08/02/23 1108 08/02/23 1440 08/02/23 2211 08/03/23 0525 08/08/23 0043  WBC 4.6   < > 4.5 3.7* 2.2*  CREATININE 1.05*  --   --   --  0.77   < > = values in this interval not displayed.   Estimated Creatinine Clearance (by C-G formula based on SCr of 0.77 mg/dL) Female: 09.8 mL/min Female: 82.4 mL/min  Allergies: Allergies  Allergen Reactions   Adhesive [Tape] Other (See Comments)    Pt reports, when removing tape from skin most tapes pull her skin off. Ok to use paper tape   Morphine And Codeine Nausea And Vomiting   Other Hives    Chlorhexadine wipes/CHG wipes,    Latex Rash    Exam gloves/ does not react around elastic and lips don't swell when blowing balloons    Antimicrobials this admission: Cefepime 3/17 >> Vancomycin 3/17 >>  Dose adjustments this admission: N/A  Microbiology results: 3/17 BCx: NGTD 3/17 Ucx: sent 3/17 RVP: negative  PLAN: Initiate cefepime 2g IV q8H Administer vancomycin 1500 mg IV x 1 as a loading dose, followed by 1250 mg IV q24H thereafter eAUC 494, Cmax 35, Cmin 11.7 Scr 0.8, IBW, Vd 0.72 L/kg Follow up culture results to assess for antibiotic optimization. Monitor renal function to assess for any necessary antibiotic dosing changes.   Thank you for allowing pharmacy to be a part of this patient's care.  Will M.  Dareen Piano, PharmD Clinical Pharmacist 08/08/2023 10:22 AM

## 2023-08-08 NOTE — ED Provider Notes (Addendum)
 [facility] Provider Note    Event Date/Time   First MD Initiated Contact with Patient 08/08/23 0034     (approximate)   History   No chief complaint on file.   HPI  Kara [name] is a 73 y.o. 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  However since being discharged from the hospital on 08/03/2023 she has felt markedly weak, fatigued, and very short of breath with minimal exertion.  She was admitted at that time for acute lower GI bleeding which has since resolved and has been asked to discontinue her Coumadin due to her recent lower GI bleeding.  She denies cough, chest pain, abdominal pain, nausea vomiting or diarrhea.  She has no dysuria.  She has no skin rashes.  She states that ever since leaving the hospital she has had normal formed brown stools without blood or melena.  She denies vomiting.  She denies vaginal bleeding.  She denies any bleeding.  External Medical Documents Reviewed: Discharge summary from 08/03/2023 for lower GI bleeding and hemorrhagic shock requiring blood transfusion.       Physical Exam   Triage Vital Signs: ED Triage Vitals  Encounter Vitals Group     BP 08/08/23 0038 119/72     Systolic BP Percentile --      Diastolic BP Percentile --      Pulse Rate 08/08/23 0038 (!) 105     Resp 08/08/23 0038 20     Temp 08/08/23 0038 98.2 F (36.8 C)     Temp Source 08/08/23 0038 Oral     SpO2 08/08/23 0038 99 %     Weight 08/08/23 0032 191 lb (86.6 kg)     Height 08/08/23 0032 5\' 3"  (1.6 m)     Head Circumference --      Peak Flow --      Pain Score 08/08/23 0037 0     Pain Loc --      Pain Education --      Exclude from Growth Chart --     Most recent vital signs: Vitals:   08/08/23 0038  BP: 119/72  Pulse: (!) 105  Resp: 20  Temp:  98.2 F (36.8 C)  SpO2: 99%    General: Awake, no distress. CV:  Good peripheral perfusion.  Resp:  Normal effort.  Abd:  No distention.  Other:  Awake alert pleasant woman in no acute distress.  She is tachycardic to 105.  She is afebrile.  Other hemodynamics appropriate reassuring.  She has a soft benign abdominal exam without masses tenderness rigidity or guarding.  She is clear lungs without focality or wheezing.  She has dry mucous membranes she looks slightly dehydrated.  She has mild swelling to the bilateral calves but only tenderness to palpation on the left side only.   ED Results / Procedures / Treatments   Labs (all labs ordered are listed, but only abnormal results are displayed) Labs Reviewed  COMPREHENSIVE METABOLIC PANEL - Abnormal; Notable for the following components:      Result Value   Sodium 134 (*)    CO2 20 (*)    Glucose, Bld 449 (*)    Calcium 8.1 (*)    Total Protein 5.7 (*)    Albumin 2.9 (*)    All other components within normal limits  CBC WITH DIFFERENTIAL/PLATELET - Abnormal; Notable for the following components:   WBC 2.2 (*)    RBC 3.35 (*)    Hemoglobin 9.9 (*)    HCT 29.8 (*)    RDW 19.2 (*)    Platelets 115 (*)    Lymphs Abs 0.2 (*)    All other components within normal limits  URINALYSIS, W/ REFLEX TO CULTURE (INFECTION SUSPECTED) - Abnormal; Notable for the following components:   Color, Urine YELLOW (*)    APPearance HAZY (*)    Specific Gravity, Urine 1.031 (*)    Glucose, UA >=500 (*)    Ketones, ur 5 (*)    Nitrite POSITIVE (*)    All other components within normal limits  LACTIC ACID, PLASMA - Abnormal; Notable for the following components:   Lactic Acid, Venous 3.4 (*)    All other components within normal limits  TSH - Abnormal; Notable for the following components:   TSH 0.327 (*)    All other components within normal limits  RESP PANEL BY RT-PCR (RSV, FLU A&B, COVID)  RVPGX2  CULTURE, BLOOD (ROUTINE X 2)  CULTURE, BLOOD  (ROUTINE X 2)  URINE CULTURE  PROTIME-INR  T4, FREE  LACTIC ACID, PLASMA  TROPONIN I (HIGH SENSITIVITY)  TROPONIN I (HIGH SENSITIVITY)     I ordered and reviewed the above labs they are notable for she is leukopenic with a white blood cell count of 2.2 and an absolute neutrophil count of 1.7.  Her lactic acid is elevated 3.4.  Her H&H is held no marked change from discharge a week ago today at 9.9  EKG  ED ECG REPORT I, Pilar Jarvis, the attending physician, personally viewed and interpreted this ECG.   Date: 08/08/2023  EKG Time: 0036  Rate: 105  Rhythm: sinus tachycardia  Axis: nl  Intervals:none  ST&T Change: no stemi    PROCEDURES:  Critical Care performed: Yes, see critical care procedure note(s)  .Critical Care  Performed by: Pilar Jarvis, MD Authorized by: Pilar Jarvis, MD   Critical care provider statement:    Critical care time (minutes):  30   Critical care was time spent personally by me on the following activities:  Development of treatment plan with patient or surrogate, discussions with consultants, evaluation of patient's response to treatment, examination of patient, ordering and review of laboratory studies, ordering and review of radiographic studies, ordering and performing treatments and interventions, pulse oximetry, re-evaluation of patient's condition and review of old charts    MEDICATIONS ORDERED IN ED: Medications  vancomycin (VANCOREADY) IVPB 2000 mg/400 mL (2,000 mg Intravenous New Bag/Given 08/08/23 0228)  sodium chloride 0.9 % bolus 500 mL (0 mLs Intravenous Stopped 08/08/23 0231)  sodium chloride 0.9 % bolus 1,000 mL (1,000 mLs Intravenous New Bag/Given 08/08/23 0216)  ceFEPIme (MAXIPIME) 2 g in sodium chloride 0.9 % 100 mL IVPB (0 g Intravenous Stopped 08/08/23 0256)  alum & mag hydroxide-simeth (MAALOX/MYLANTA) 200-200-20 MG/5ML suspension 30 mL (30 mLs Oral Given 08/08/23 0218)    And  lidocaine (XYLOCAINE) 2 % viscous mouth solution 15 mL (15  mLs Oral Given 08/08/23 0218)  famotidine (PEPCID) tablet 20 mg (20 mg Oral Given 08/08/23 0218)  iohexol (OMNIPAQUE) 350 MG/ML injection 75 mL (75 mLs Intravenous Contrast Given 08/08/23 0252)    External physician / consultants:  I spoke with hospital medicine for admission and regarding care plan for this patient.   IMPRESSION / MDM / ASSESSMENT AND PLAN / ED COURSE  I reviewed the  triage vital signs and the nursing notes.                                Patient's presentation is most consistent with acute presentation with potential threat to life or bodily function.  Differential diagnosis includes, but is not limited to, VTE like PE or DVT, lower GI bleeding or anemia, electrolyte disturbance, dehydration, sepsis, deconditioning   The patient is on the cardiac monitor to evaluate for evidence of arrhythmia and/or significant heart rate changes.  MDM:    This is a patient who is immunocompromised on chemotherapy with recent admission for lower GI bleeding who has not been taking her Coumadin for the last approximately 1 week here with generalized weakness fatigue and exertional shortness of breath and is for also found to be tachycardic with lactic acidosis, leukopenia and borderline neutropenia as well as left calf tenderness.  I doubt recurrent bleeding as she reports no bleeding and her H&H have held relatively stable since discharge.  Although her symptoms may be due to deconditioning from her recent hospitalization, I am also worried about VTE given her left calf tenderness as well as her exertional dyspnea now off of Coumadin with a history of extensive VTE.  Will check with a CT angiogram of the chest as well as DVT ultrasound.  I am also worried about sepsis given her tachycardia lactic acidosis and immunocompromise state with leukopenia and borderline neutropenia today.  She is afebrile and has no localizing signs or symptoms of infection, however.  I have given her 30 cc per  ideal body weight fluid bolus as well as broad-spectrum antibiotics given concern for sepsis.  Test pending including urinalysis, CT chest, viral swabs, blood cultures.  Defer belly imaging as she has no GI symptoms with a benign abdominal exam.  After initial lab analysis and imaging will require admission   -- Nitrite positive urine but no bacteria or significant inflammatory changes.  May be her source.  Urine culture sent.  CT angiogram of the chest shows no large PE, notes right-sided segmental filling defects versus more likely motion artifact.  DVT negative.  Admission.      FINAL CLINICAL IMPRESSION(S) / ED DIAGNOSES   Final diagnoses:  Other fatigue  Exertional dyspnea  Leukopenia, unspecified type  Immunocompromised state (HCC)  Lactic acidosis     Rx / DC Orders   ED Discharge Orders     None        Note:  This document was prepared using Dragon voice recognition software and may include unintentional dictation errors.    Pilar Jarvis, MD 08/08/23 Therisa Doyne    Pilar Jarvis, MD 08/08/23 [phone]

## 2023-08-08 NOTE — Assessment & Plan Note (Signed)
 Blood sugars 400s  SSI  A1C  Monitor

## 2023-08-08 NOTE — ED Notes (Signed)
 Incontinence care provided. Pt urinates. New blankets and brief placed.

## 2023-08-08 NOTE — ED Notes (Signed)
 Patient assisted to use bedside commode. Patient able to stand and pivot with no assistance. Patient urinated and a new brief was placed on the patient. Patient returned to bed. Bed in lowest position with call light in reach. Patient denies other needs at this time.

## 2023-08-08 NOTE — ED Notes (Signed)
 Assisted pt to bathroom at this time. Clean linen, gown and brief provided. Pt denies weakness at this time. Warm blankets provided and call light within reach.

## 2023-08-08 NOTE — Assessment & Plan Note (Addendum)
 Noted recent episode of GI bleeding now resolved Resume Coumadin per pharmacy in the setting history of DVT and PE as well as active malignancy.  INR on the low side at 1.2.  Patient states she will go back on her usual dose of 6 mg of Coumadin and recheck in a few days.

## 2023-08-08 NOTE — ED Notes (Signed)
 Pt assisted to restroom.

## 2023-08-08 NOTE — Assessment & Plan Note (Signed)
 Keep appointment with oncology as outpatient.

## 2023-08-08 NOTE — Inpatient Diabetes Management (Signed)
 Inpatient Diabetes Program Recommendations  AACE/ADA: New Consensus Statement on Inpatient Glycemic Control (2015)  Target Ranges:  Prepandial:   less than 140 mg/dL      Peak postprandial:   less than 180 mg/dL (1-2 hours)      Critically ill patients:  140 - 180 mg/dL   Lab Results  Component Value Date   GLUCAP 252 (H) 08/03/2023   HGBA1C 7.0 (H) 05/09/2023    Review of Glycemic Control  Latest Reference Range & Units 08/02/23 17:11 08/02/23 22:44 08/03/23 08:28 08/03/23 12:05  Glucose-Capillary 70 - 99 mg/dL 409 (H) 811 (H) 914 (H) 252 (H)  (H): Data is abnormally high   Diabetes history: DM2 Outpatient Diabetes medications: Trulicity 1.5 mg Qweek, Glipizide 10 mg daily Current orders for Inpatient glycemic control: None   Inpatient Diabetes Program Recommendations:     Consider ordering Semglee 5 units daily, Novolog 0-15 units TID and adding Novolog 0-5 units at bedtime.    Thanks, Lujean Rave, MSN, RNC-OB Diabetes Coordinator [phone] (8a-5p)

## 2023-08-08 NOTE — ED Notes (Signed)
 Incontinence care provided, Pt incontinent of urine.

## 2023-08-08 NOTE — ED Triage Notes (Signed)
 Pt to ED via EMS from home, pt reports dizziness with standing and weakness x 1 week. Pt has hx lung cx, she was seen here last week for a blood transfusion.

## 2023-08-08 NOTE — ED Notes (Signed)
 Pt reported urinary incontinence. Brief changed at this time.

## 2023-08-08 NOTE — Progress Notes (Signed)
 ED Pharmacy Antibiotic Sign Off An antibiotic consult was received from an ED provider for Vancomycin  per pharmacy dosing for sepsis. A chart review was completed to assess appropriateness.   The following one time order(s) were placed:  Vancomycin 2 gm IV X 1.   Further antibiotic and/or antibiotic pharmacy consults should be ordered by the admitting provider if indicated.   Thank you for allowing pharmacy to be a part of this patient's care.   Scherrie Gerlach, [facility]  Clinical Pharmacist 08/08/23 1:51 AM

## 2023-08-09 ENCOUNTER — Ambulatory Visit

## 2023-08-09 ENCOUNTER — Ambulatory Visit: Payer: PPO

## 2023-08-09 DIAGNOSIS — E1165 Type 2 diabetes mellitus with hyperglycemia: Secondary | ICD-10-CM

## 2023-08-09 DIAGNOSIS — E872 Acidosis, unspecified: Secondary | ICD-10-CM | POA: Diagnosis not present

## 2023-08-09 DIAGNOSIS — N3000 Acute cystitis without hematuria: Secondary | ICD-10-CM | POA: Diagnosis not present

## 2023-08-09 DIAGNOSIS — D61818 Other pancytopenia: Secondary | ICD-10-CM

## 2023-08-09 DIAGNOSIS — Z7901 Long term (current) use of anticoagulants: Secondary | ICD-10-CM

## 2023-08-09 DIAGNOSIS — R6510 Systemic inflammatory response syndrome (SIRS) of non-infectious origin without acute organ dysfunction: Secondary | ICD-10-CM

## 2023-08-09 DIAGNOSIS — C3490 Malignant neoplasm of unspecified part of unspecified bronchus or lung: Secondary | ICD-10-CM | POA: Diagnosis not present

## 2023-08-09 LAB — COMPREHENSIVE METABOLIC PANEL
ALT: 26 U/L (ref 0–44)
AST: 21 U/L (ref 15–41)
Albumin: 2.4 g/dL — ABNORMAL LOW (ref 3.5–5.0)
Alkaline Phosphatase: 30 U/L — ABNORMAL LOW (ref 38–126)
Anion gap: 6 (ref 5–15)
BUN: 11 mg/dL (ref 8–23)
CO2: 20 mmol/L — ABNORMAL LOW (ref 22–32)
Calcium: 7.7 mg/dL — ABNORMAL LOW (ref 8.9–10.3)
Chloride: 109 mmol/L (ref 98–111)
Creatinine, Ser: 0.58 mg/dL (ref 0.44–1.00)
GFR, Estimated: >60 mL/min (ref >60)
Glucose, Bld: 219 mg/dL — ABNORMAL HIGH (ref 70–99)
Potassium: 3.7 mmol/L (ref 3.5–5.1)
Sodium: 135 mmol/L (ref 135–145)
Total Bilirubin: 0.8 mg/dL (ref 0.0–1.2)
Total Protein: 5 g/dL — ABNORMAL LOW (ref 6.5–8.1)

## 2023-08-09 LAB — CBC
HCT: 27.3 % — ABNORMAL LOW (ref 36.0–46.0)
Hemoglobin: 8.8 g/dL — ABNORMAL LOW (ref 12.0–15.0)
MCH: 29.2 pg (ref 26.0–34.0)
MCHC: 32.2 g/dL (ref 30.0–36.0)
MCV: 90.7 fL (ref 80.0–100.0)
Platelets: 108 10*3/uL — ABNORMAL LOW (ref 150–400)
RBC: 3.01 MIL/uL — ABNORMAL LOW (ref 3.87–5.11)
RDW: 19.7 % — ABNORMAL HIGH (ref 11.5–15.5)
WBC: 1.9 10*3/uL — ABNORMAL LOW (ref 4.0–10.5)
nRBC: 0 % (ref 0.0–0.2)

## 2023-08-09 LAB — CBG MONITORING, ED: Glucose-Capillary: 182 mg/dL — ABNORMAL HIGH (ref 70–99)

## 2023-08-09 LAB — PROTIME-INR
INR: 1.2 (ref 0.8–1.2)
Prothrombin Time: 15.2 s (ref 11.4–15.2)

## 2023-08-09 MED ORDER — HEPARIN SOD (PORK) LOCK FLUSH 100 UNIT/ML IV SOLN
500.0000 [IU] | Freq: Once | INTRAVENOUS | Status: AC
  Administered 2023-08-09: 500 [IU] via INTRAVENOUS
  Filled 2023-08-09: qty 5

## 2023-08-09 MED ORDER — WARFARIN SODIUM 6 MG PO TABS
7.0000 mg | ORAL_TABLET | Freq: Once | ORAL | Status: DC
  Filled 2023-08-09: qty 1

## 2023-08-09 MED ORDER — AZITHROMYCIN 250 MG PO TABS: ORAL_TABLET | ORAL | 0 refills | Status: DC

## 2023-08-09 NOTE — Hospital Course (Signed)
 73 y.o. 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  Patient reports generalized malaise over the past 24 hours.  No fevers chills.  No headache.  No cough.  Minimal abdominal pain.  No focal hemiparesis or confusion.  Mild dizziness x 1 week.  Followed by Dr. Smith Robert outpatient in the setting of recurrent adenocarcinoma.  Currently on chemotherapy and radiation.  Symptoms have persisted over the course of the past 3 to 4 days.  No reported alcohol tobacco use.  No reported recent significant medication changes.  Noted admission March 11 March 12 for issues including hemorrhagic shock and acute GI bleeding.  Has resumed Coumadin per hospital discharge.  No reported episodes of GI bleeding since discharge. Presented to the ER afebrile, heart rate in the 100s, respirations into the 20s, BP stable.  Satting well on room air.  White count 2.2, hemoglobin 9.9, platelets 115, lactate 3.4-1.9 troponin within normal limits.  Urinalysis minimally indicative of infection.  COVID flu and RSV negative.  Creatinine 0.77.  Glucose 449.  Lower extremity ultrasound as well as CTA of the chest negative for PE/DVT or acute abnormalities  3/18.  Patient states she came in for weakness.  She states she feels much better and wanted to go home.  Does not complain of cough or shortness of breath.  Felt better after IV fluid hydration.

## 2023-08-09 NOTE — Assessment & Plan Note (Signed)
 After discharge Klebsiella came back positive in the urine culture.  Will switch antibiotics over to Keflex which was called into her pharmacy.

## 2023-08-09 NOTE — Assessment & Plan Note (Signed)
 Secondary to recent chemo.

## 2023-08-09 NOTE — Progress Notes (Signed)
 PHARMACY - ANTICOAGULATION CONSULT NOTE  Pharmacy Consult for warfarin Indication:  Hx of DVT  Allergies  Allergen Reactions   Adhesive [Tape] Other (See Comments)    Pt reports, when removing tape from skin most tapes pull her skin off. Ok to use paper tape   Morphine And Codeine Nausea And Vomiting   Other Hives    Chlorhexadine wipes/CHG wipes,    Latex Rash    Exam gloves/ does not react around elastic and lips don't swell when blowing balloons    Patient Measurements: Height: 5\' 3"  (160 cm) Weight: 86.6 kg (191 lb) IBW/kg (Calculated) : 52.4  Vital Signs: Temp: 98.1 F (36.7 C) (03/18 0620) Temp Source: Oral (03/18 0620) BP: 159/85 (03/18 0630) Pulse Rate: 95 (03/18 0630)  Labs: Recent Labs    08/08/23 0043 08/08/23 0307 08/09/23 0442  HGB 9.9*  --  8.8*  HCT 29.8*  --  27.3*  PLT 115*  --  108*  LABPROT 14.1  --  15.2  INR 1.1  --  1.2  CREATININE 0.77  --  0.58  TROPONINIHS 5 5  --     Estimated Creatinine Clearance (by C-G formula based on SCr of 0.58 mg/dL) Female: 10.2 mL/min Female: 82.4 mL/min   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, 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   CHF (congestive heart failure) (HCC)    followed colon resection, "wouldn't let me get up"   Chronic anticoagulation    Degenerative disc disease, lumbar    Diverticulitis 2018   Diverticulosis    DVT of lower extremity, bilateral (HCC) 2018   Essential hypertension    History of being hospitalized    1 month ago for 3 days, vomiting, and kink in upper intestine   History of hiatal hernia    Mediastinal adenopathy 04/2023   MRSA nasal colonization    Non-small cell lung cancer (HCC) 2022   right   Obesity (BMI 30-39.9)    Osteoarthritis    Osteoporosis    PE (pulmonary thromboembolism) (HCC) 2018   Pneumonia    Small bowel obstruction (HCC) 03/20/2023   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HCC)     Type 2 diabetes mellitus with obesity (HCC)    Vertigo    Assessment: 73 y/o female presenting with general malaise. 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 Pharmacy has been consulted to resume PTA warfarin.  PTA warfarin regimen per patient: 6 mg daily (weekly dose of 42 mg) Last warfarin dose: 6 mg on 08/07/23 **Of note, patient's INR was elevated at 4.2 at office visit on 07/26/23, and, per chart notes, provider instructed patient to adjust weekly regimen to 5 mg on Sat/Sun, then 6 mg all other days (weekly dose of 40 mg). Patient states she should still be taking 6 mg daily. **Patient has previously admitted on 08/02/23 with INR of 9.8 and bleeding likely from internal hemorrhoid per discharge notes.  Patient received Kcentra 1566 units IV x 1 (3/11) and Vitamin K 10 mg IV x 1 (3/11)  Baseline labs: INR 1.1, PT 14.1, hgb 9.9, plt 115  Noted DDI:  Ropinirole: may increase the hypoprothrombinemic effects of warfarin; may require a reduction in total weekly warfarin dose  Goal of Therapy:  INR 2-3 Monitor platelets by anticoagulation protocol: Yes  Date INR Warfarin Dose  3/17 1.1 9 mg  3/18 1.2 7 mg     Plan:  Give warfarin 7 mg po x 1 today (Slight increase from reported home dose) Start enoxaparin 80 mg SQ BID and continue to bridge until INR > 2 Monitor INR and CBC daily  Thank you for involving pharmacy in this patient's care.   Barrie Folk, PharmD Clinical Pharmacist 08/09/2023 7:08 AM

## 2023-08-09 NOTE — Assessment & Plan Note (Signed)
 Patient go back on her usual medications

## 2023-08-09 NOTE — Assessment & Plan Note (Signed)
 Patient was feeling better and wanted to go home.  Sepsis ruled out.  On admission patient had heart rate in the 100s and white blood cell count of 2.2.  Looks like urine culture came back Klebsiella pneumonia after discharge.  Will change antibiotics over to Keflex 500 mg 3 times daily for 5 days and this was called into [facility].

## 2023-08-09 NOTE — ED Notes (Signed)
 Incontinence care provided, urinates, brief changed.

## 2023-08-09 NOTE — ED Notes (Signed)
 Incontinence care provided. Linens and brief changed. Given new blankets.

## 2023-08-09 NOTE — Assessment & Plan Note (Signed)
 Improved with IV fluid

## 2023-08-09 NOTE — ED Notes (Signed)
 Assisted pt to changed brief. Pt was able to stand and do most of it herself with very minimal assist. Changed bed pads.

## 2023-08-09 NOTE — Discharge Summary (Signed)
 Physician Discharge Summary   Patient: Kara [name] MRN: [id number] DOB: 10/23/1950  Admit date:     08/08/2023  Discharge date: 08/09/23  Discharge Physician: Alford Highland   PCP: Barbette Reichmann, MD   Recommendations at discharge:   Follow-up PCP 5 days Follow-up with your oncologist  Discharge Diagnoses: Principal Problem:   SIRS (systemic inflammatory response syndrome) (HCC) Active Problems:   Lactic acidosis   Acute cystitis without hematuria   Pancytopenia (HCC)   Recurrent non-small cell lung cancer (NSCLC) (HCC)   Uncontrolled type 2 diabetes mellitus with hyperglycemia, without long-term current use of insulin (HCC)   Chronic anticoagulation (COUMADIN)    Hospital Course: 73 y.o. 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  Patient reports generalized malaise over the past 24 hours.  No fevers chills.  No headache.  No cough.  Minimal abdominal pain.  No focal hemiparesis or confusion.  Mild dizziness x 1 week.  Followed by Dr. Smith Robert outpatient in the setting of recurrent adenocarcinoma.  Currently on chemotherapy and radiation.  Symptoms have persisted over the course of the past 3 to 4 days.  No reported alcohol tobacco use.  No reported recent significant medication changes.  Noted admission March 11 March 12 for issues including hemorrhagic shock and acute GI bleeding.  Has resumed Coumadin per hospital discharge.  No reported episodes of GI bleeding since discharge. Presented to the ER afebrile, heart rate in the 100s, respirations into the 20s, BP stable.  Satting well on room air.  White count 2.2, hemoglobin 9.9, platelets 115, lactate 3.4-1.9 troponin within normal limits.  Urinalysis minimally indicative of infection.  COVID flu and RSV negative.  Creatinine 0.77.  Glucose 449.  Lower extremity  ultrasound as well as CTA of the chest negative for PE/DVT or acute abnormalities  3/18.  Patient states she came in for weakness.  She states she feels much better and wanted to go home.  Does not complain of cough or shortness of breath.  Felt better after IV fluid hydration.  Assessment and Plan: * SIRS (systemic inflammatory response syndrome) (HCC) Patient was feeling better and wanted to go home.  Sepsis ruled out.  On admission patient had heart rate in the 100s and white blood cell count of 2.2.  Looks like urine culture came back Klebsiella pneumonia after discharge.  Will change antibiotics over to Keflex 500 mg 3 times daily for 5 days and this was called into [facility].  Lactic acidosis Improved with IV fluid  Acute cystitis without hematuria After discharge Klebsiella came back positive in the urine culture.  Will switch antibiotics over to Keflex which was called into her pharmacy.  Pancytopenia (HCC) Secondary to recent chemo.  Recurrent non-small cell lung cancer (NSCLC) (HCC) Follow-up with oncology as outpatient  Uncontrolled type 2 diabetes mellitus with hyperglycemia, without long-term current use of insulin (HCC) Patient go back on her usual medications  Chronic anticoagulation (COUMADIN) Noted recent episode of GI bleeding now resolved Resume Coumadin per pharmacy in the setting history of DVT and PE as well as active malignancy.  INR on the low side at 1.2.  Patient states she will go back on her usual dose of 6 mg of Coumadin and recheck in a few days.          Consultants: None Procedures performed: None Disposition: Home Diet recommendation:  Carb modified diet DISCHARGE MEDICATION: Allergies as of 08/09/2023       Reactions   Adhesive [tape] Other (See Comments)   Pt reports, when removing tape from skin most tapes pull her skin off. Ok to use paper tape   Avapro [irbesartan] Anaphylaxis   Facial swelling   Morphine And Codeine Nausea And  Vomiting   Other Hives   Chlorhexadine wipes/CHG wipes,    Latex Rash   Exam gloves/ does not react around elastic and lips don't swell when blowing balloons        Medication List     STOP taking these medications    dexamethasone 4 MG tablet Commonly known as: DECADRON   fluconazole 100 MG tablet Commonly known as: DIFLUCAN       TAKE these medications    azithromycin 250 MG tablet Commonly known as: Zithromax Two tabs po day 1 and one tab po daily afterwards   bisacodyl 10 MG suppository Commonly known as: DULCOLAX Place 1 suppository (10 mg total) rectally as needed for moderate constipation.   candesartan 16 MG tablet Commonly known as: ATACAND Take 16 mg by mouth in the morning.   cholecalciferol 25 MCG (1000 UNIT) tablet Commonly known as: VITAMIN D3 Take 1,000 Units by mouth in the morning.   cyclobenzaprine 10 MG tablet Commonly known as: FLEXERIL Take 10 mg by mouth at bedtime.   gabapentin 100 MG capsule Commonly known as: NEURONTIN Take 200 mg by mouth with breakfast, with lunch, and with evening meal.   gabapentin 300 MG capsule Commonly known as: NEURONTIN Take 300 mg by mouth at bedtime.   glipiZIDE 10 MG tablet Commonly known as: GLUCOTROL Take 10 mg by mouth daily before breakfast.   HYDROcodone-acetaminophen 5-325 MG tablet Commonly known as: NORCO/VICODIN Take 1 tablet by mouth every 6 (six) hours as needed for moderate pain.   Magnesium Oxide -Mg Supplement 500 MG Tabs Take 1 tablet by mouth at bedtime.   OneTouch Ultra Test test strip Generic drug: glucose blood   oxybutynin 5 MG tablet Commonly known as: DITROPAN Take 1 tablet by mouth 2 (two) times daily.   potassium chloride SA 20 MEQ tablet Commonly known as: KLOR-CON M Take 1 tablet (20 mEq total) by mouth daily.   prochlorperazine 10 MG tablet Commonly known as: COMPAZINE Take 1 tablet (10 mg total) by mouth every 6 (six) hours as needed for nausea or vomiting.    rOPINIRole 1 MG tablet Commonly known as: REQUIP Take 1 mg by mouth at bedtime.   sucralfate 1 g tablet Commonly known as: Carafate Take 1 tablet (1 g total) by mouth 3 (three) times daily with meals. Dissolve tablet in 4 tablesppons warm water swish and swallow 30 min before meals. What changed: additional instructions   Trulicity 1.5 MG/0.5ML Soaj Generic drug: Dulaglutide Inject 1.5 mg into the skin once a week. Saturday   Vitamin B-12 5000 MCG Tbdp Take 5,000 mcg by mouth in the morning.   warfarin 6 MG tablet Commonly known as: COUMADIN Take 6 mg by mouth at bedtime.        Follow-up Information     Barbette Reichmann, MD Follow up in 5 day(s).   Specialty: Internal Medicine Contact information: [address] [place] [address] [phone]                Discharge Exam: Ceasar Mons Weights   08/08/23 0032  Weight: 86.6 kg   Physical Exam HENT:  Head: Normocephalic.     Mouth/Throat:     Pharynx: No oropharyngeal exudate.  Eyes:     General: Lids are normal.     Conjunctiva/sclera: Conjunctivae normal.  Cardiovascular:     Rate and Rhythm: Normal rate and regular rhythm.     Heart sounds: Normal heart sounds, S1 normal and S2 normal.  Pulmonary:     Breath sounds: No decreased breath sounds, wheezing, rhonchi or rales.  Abdominal:     Palpations: Abdomen is soft.     Tenderness: There is no abdominal tenderness.  Musculoskeletal:     Right lower leg: No swelling.     Left lower leg: No swelling.  Skin:    General: Skin is warm.     Findings: No rash.  Neurological:     Mental Status: She is alert and oriented to person, place, and time.      Condition at discharge: stable  The results of significant diagnostics from this hospitalization (including imaging, microbiology, ancillary and laboratory) are listed below for reference.   Imaging Studies: CT Angio Chest PE W/Cm &/Or Wo Cm Result Date:  08/08/2023 CLINICAL DATA:  Dizziness, weakness, history of lung cancer, EXAM: CT ANGIOGRAPHY CHEST WITH CONTRAST TECHNIQUE: Multidetector CT imaging of the chest was performed using the standard protocol during bolus administration of intravenous contrast. Multiplanar CT image reconstructions and MIPs were obtained to evaluate the vascular anatomy. RADIATION DOSE REDUCTION: This exam was performed according to the departmental dose-optimization program which includes automated exposure control, adjustment of the mA and/or kV according to patient size and/or use of iterative reconstruction technique. CONTRAST:  75mL OMNIPAQUE IOHEXOL 350 MG/ML SOLN COMPARISON:  PET/CT 06/03/2023 and radiograph 05/16/2023 FINDINGS: Cardiovascular: No central pulmonary embolism. Question nonocclusive filling defects within a segmental branch the right lower lobe pulmonary arteries (circa series 4/image 73). However evaluation is limited by significant artifact in this area. No pericardial effusion. Mild aortic atherosclerotic calcification. Right chest wall Port-A-Cath. Mediastinum/Nodes: Bowing of the posterior trachea compatible with expiratory phase. Esophagus is unremarkable. 11 mm subcarinal lymph node is unchanged from 06/03/2023 PET/CT. Lungs/Pleura: Unchanged right hilar mass/consolidation. This was hypermetabolic on PET/CT 06/03/2023. Mild mosaic attenuation of the lungs compatible with air trapping. Small right pleural effusion. No pneumothorax. Upper Abdomen: Cholelithiasis. No evidence of acute cholecystitis. Hepatic steatosis. No acute abnormality. Musculoskeletal: No acute fracture. Review of the MIP images confirms the above findings. IMPRESSION: 1. No central pulmonary embolism. Possible filling defects within a segmental branch of the right lower lobe pulmonary arteries is favored artifactual given significant motion in this area nonocclusive pulmonary embolism is considered less likely though unable to be excluded. 2.  Unchanged right hilar mass/consolidation. This was hypermetabolic on PET/CT 06/03/2023. 3. Small right pleural effusion. 4. Mosaic attenuation of the lungs compatible with air trapping. 5. Cholelithiasis. 6. Hepatic steatosis. 7. Aortic Atherosclerosis (ICD10-I70.0). Electronically Signed   By: Minerva Fester M.D.   On: 08/08/2023 03:23   US Venous Img Lower Bilateral Result Date: 08/08/2023 CLINICAL DATA:  Lower extremity pain EXAM: Bilateral  LOWER EXTREMITY VENOUS DOPPLER ULTRASOUND TECHNIQUE: Gray-scale sonography with compression, as well as color and duplex ultrasound, were performed to evaluate the deep venous system(s) from the level of the common femoral vein through the popliteal and proximal calf veins. COMPARISON:  ultrasound 04/10/2018 FINDINGS: VENOUS Normal compressibility of the common femoral, superficial femoral, and popliteal veins, as well as the visualized calf veins. Visualized portions of profunda femoral vein and great saphenous vein unremarkable. No filling defects  to suggest DVT on grayscale or color Doppler imaging. Doppler waveforms show normal direction of venous flow, normal respiratory plasticity and response to augmentation. Limited views of the contralateral common femoral vein are unremarkable. OTHER None. Limitations: none IMPRESSION: Negative. Electronically Signed   By: Minerva Fester M.D.   On: 08/08/2023 02:10   CT ANGIO GI BLEED Addendum Date: 08/02/2023 ADDENDUM REPORT: 08/02/2023 15:41 ADDENDUM: Upon further review there is active arterial extravasation within a loop of bowel in the right lower quadrant at the site of prior distal small bowel anastomosis. Marliss Coots, MD [facility] Radiology Electronically Signed   By: Marliss Coots M.D.   On: 08/02/2023 15:41   Result Date: 08/02/2023 CLINICAL DATA:  73 year old female with history of lower gastrointestinal hemorrhage. EXAM: CTA ABDOMEN AND PELVIS WITHOUT AND WITH  CONTRAST TECHNIQUE: Multidetector CT imaging of the abdomen and pelvis was performed using the standard protocol during bolus administration of intravenous contrast. Multiplanar reconstructed images and MIPs were obtained and reviewed to evaluate the vascular anatomy. RADIATION DOSE REDUCTION: This exam was performed according to the departmental dose-optimization program which includes automated exposure control, adjustment of the mA and/or kV according to patient size and/or use of iterative reconstruction technique. CONTRAST:  OMNIPAQUE IOHEXOL 350 MG/ML SOLN COMPARISON:  07/06/2023 FINDINGS: VASCULAR Aorta: Normal caliber aorta without aneurysm, dissection, vasculitis or significant stenosis. Mild scattered atherosclerotic calcifications. Celiac: Patent without evidence of aneurysm, dissection, vasculitis or significant stenosis. SMA: Patent without evidence of aneurysm, dissection, vasculitis or significant stenosis. Renals: Single bilateral renal arteries are patent without evidence of aneurysm, dissection, vasculitis, fibromuscular dysplasia or significant stenosis. IMA: Patent without evidence of aneurysm, dissection, vasculitis or significant stenosis. Inflow: Patent without evidence of aneurysm, dissection, vasculitis or significant stenosis. Proximal Outflow: Bilateral common femoral and visualized portions of the superficial and profunda femoral arteries are patent without evidence of aneurysm, dissection, vasculitis or significant stenosis. Veins: No obvious venous abnormality within the limitations of this arterial phase study. Review of the MIP images confirms the above findings. NON-VASCULAR Lower chest: No acute abnormality. Hepatobiliary: No focal liver abnormality is seen. Unchanged appearance of multiple peripherally calcified gallstones layering in the gallbladder neck without evidence of gallbladder distension, wall thickening, or pericholecystic fluid. No intra or extrahepatic biliary  ductal dilation. Pancreas: Unremarkable. No pancreatic ductal dilatation or surrounding inflammatory changes. Spleen: Normal in size without focal abnormality. Adrenals/Urinary Tract: Adrenal glands are unremarkable. Kidneys are normal, without renal calculi, focal lesion, or hydronephrosis. Bladder is unremarkable. Stomach/Bowel: Stomach is within normal limits. Similar appearing small bowel surgical anastomosis in the anterior lower abdomen without complicating features. Unchanged colo-colonic anastomosis without complicating features at the rectosigmoid junction. No evidence of bowel wall thickening, distention, or inflammatory changes. There is focal hyperenhancement on venous phase only within the lumen of the distal rectum. Lymphatic: No abdominopelvic lymphadenopathy. Reproductive: Uterus and bilateral adnexa are unremarkable. Other: Unchanged supraumbilical hernia containing transverse colon and small bowel without evidence of strangulation or obstruction. Musculoskeletal: No acute osseous abnormality. Similar appearing multilevel degenerative changes of the visualized thoracolumbar spine. IMPRESSION: 1. Venous phase intraluminal extravasation about the distal rectum suggestive of internal hemorrhoidal hemorrhage. No additional evidence of gastrointestinal hemorrhage. 2. Cholelithiasis. 3.  Aortic Atherosclerosis (ICD10-I70.0). Marliss Coots, MD [facility] Radiology Electronically Signed: By: Marliss Coots M.D. On: 08/02/2023 14:12    Microbiology: Results for orders placed or performed during the hospital encounter of 08/08/23  Resp panel by RT-PCR (RSV, Flu A&B,  Covid) Anterior Nasal Swab     Status: None   Collection Time: 08/08/23 12:44 AM   Specimen: Anterior Nasal Swab  Result Value Ref Range Status   SARS Coronavirus 2 by RT PCR NEGATIVE NEGATIVE Final    Comment: (NOTE) SARS-CoV-2 target nucleic acids are NOT DETECTED.  The SARS-CoV-2 RNA  is generally detectable in upper respiratory specimens during the acute phase of infection. The lowest concentration of SARS-CoV-2 viral copies this assay can detect is 138 copies/mL. A negative result does not preclude SARS-Cov-2 infection and should not be used as the sole basis for treatment or other patient management decisions. A negative result may occur with  improper specimen collection/handling, submission of specimen other than nasopharyngeal swab, presence of viral mutation(s) within the areas targeted by this assay, and inadequate number of viral copies(<138 copies/mL). A negative result must be combined with clinical observations, patient history, and epidemiological information. The expected result is Negative.  Fact Sheet for Patients:  BloggerCourse.com  Fact Sheet for Healthcare Providers:  SeriousBroker.it  This test is no t yet approved or cleared by the [place] FDA and  has been authorized for detection and/or diagnosis of SARS-CoV-2 by FDA under an Emergency Use Authorization (EUA). This EUA will remain  in effect (meaning this test can be used) for the duration of the COVID-19 declaration under Section 564(b)(1) of the Act, 21 U.S.C.section 360bbb-3(b)(1), unless the authorization is terminated  or revoked sooner.       Influenza A by PCR NEGATIVE NEGATIVE Final   Influenza B by PCR NEGATIVE NEGATIVE Final    Comment: (NOTE) The Xpert Xpress SARS-CoV-2/FLU/RSV plus assay is intended as an aid in the diagnosis of influenza from Nasopharyngeal swab specimens and should not be used as a sole basis for treatment. Nasal washings and aspirates are unacceptable for Xpert Xpress SARS-CoV-2/FLU/RSV testing.  Fact Sheet for Patients: BloggerCourse.com  Fact Sheet for Healthcare Providers: SeriousBroker.it  This test is not yet approved or cleared by the  [place] FDA and has been authorized for detection and/or diagnosis of SARS-CoV-2 by FDA under an Emergency Use Authorization (EUA). This EUA will remain in effect (meaning this test can be used) for the duration of the COVID-19 declaration under Section 564(b)(1) of the Act, 21 U.S.C. section 360bbb-3(b)(1), unless the authorization is terminated or revoked.     Resp Syncytial Virus by PCR NEGATIVE NEGATIVE Final    Comment: (NOTE) Fact Sheet for Patients: BloggerCourse.com  Fact Sheet for Healthcare Providers: SeriousBroker.it  This test is not yet approved or cleared by the [place] FDA and has been authorized for detection and/or diagnosis of SARS-CoV-2 by FDA under an Emergency Use Authorization (EUA). This EUA will remain in effect (meaning this test can be used) for the duration of the COVID-19 declaration under Section 564(b)(1) of the Act, 21 U.S.C. section 360bbb-3(b)(1), unless the authorization is terminated or revoked.  Performed at [facility], [address] [place], [place] [address]   Blood Culture (routine x 2)     Status: None (Preliminary result)   Collection Time: 08/08/23  2:22 AM   Specimen: BLOOD RIGHT ARM  Result Value Ref Range Status   Specimen Description BLOOD RIGHT ARM  Final   Special Requests   Final    BOTTLES DRAWN AEROBIC AND ANAEROBIC Blood Culture results may not be optimal due to an inadequate volume of blood received in culture bottles   Culture   Final    NO GROWTH 1 DAY  Performed at [facility] [address] [place], [place] [address]    Report Status PENDING  Incomplete  Blood Culture (routine x 2)     Status: None (Preliminary result)   Collection Time: 08/08/23  2:22 AM   Specimen: BLOOD RIGHT ARM  Result Value Ref Range Status   Specimen Description BLOOD RIGHT ARM  Final   Special Requests   Final    BOTTLES DRAWN AEROBIC AND ANAEROBIC Blood  Culture adequate volume   Culture   Final    NO GROWTH 1 DAY Performed at [facility], [address], [place], [place] [address]    Report Status PENDING  Incomplete  Urine Culture     Status: Abnormal (Preliminary result)   Collection Time: 08/08/23  2:22 AM   Specimen: Urine, Clean Catch  Result Value Ref Range Status   Specimen Description   Final    URINE, CLEAN CATCH Performed at [facility], [address], [place], [place] [address]    Special Requests   Final    NONE Performed at [facility], [address], [place], [place] [address]    Culture (A)  Final    >=100,000 COLONIES/mL KLEBSIELLA PNEUMONIAE SUSCEPTIBILITIES TO FOLLOW Performed at [facility], [address], [place], [place] [address]    Report Status PENDING  Incomplete   Keflex called into pharmacy and Zithromax discontinued. Labs: CBC: Recent Labs  Lab 08/02/23 2211 08/03/23 0525 08/08/23 0043 08/09/23 0442  WBC 4.5 3.7* 2.2* 1.9*  NEUTROABS 3.4 2.8 1.7  --   HGB 10.6* 10.6* 9.9* 8.8*  HCT 31.0* 31.3* 29.8* 27.3*  MCV 86.6 84.8 89.0 90.7  PLT 93* 99* 115* 108*   Basic Metabolic Panel: Recent Labs  Lab 08/08/23 0043 08/09/23 0442  NA 134* 135  K 4.1 3.7  CL 104 109  CO2 20* 20*  GLUCOSE 449* 219*  BUN 14 11  CREATININE 0.77 0.58  CALCIUM 8.1* 7.7*   Liver Function Tests: Recent Labs  Lab 08/08/23 0043 08/09/23 0442  AST 30 21  ALT 37 26  ALKPHOS 44 30*  BILITOT 0.6 0.8  PROT 5.7* 5.0*  ALBUMIN 2.9* 2.4*   CBG: Recent Labs  Lab 08/03/23 1205 08/08/23 1118 08/08/23 1644 08/08/23 2139 08/09/23 0820  GLUCAP 252* 268* 272* 257* 182*    Discharge time spent: greater than 30 minutes.  Since urine culture came back positive for Klebsiella antibiotics switched from Zithromax over to Keflex which was called into the pharmacy.  Spoke with [facility] Lexicographer.  I Also Spoke with Patient's Husband antibiotic change.  Signed: Alford Highland, MD [facility] 08/09/2023

## 2023-08-10 ENCOUNTER — Ambulatory Visit
Admission: RE | Admit: 2023-08-10 | Discharge: 2023-08-10 | Disposition: A | Discharge status: Home / Self Care | Payer: PPO | Source: Ambulatory Visit | Attending: Radiation Oncology | Admitting: Radiation Oncology

## 2023-08-10 ENCOUNTER — Other Ambulatory Visit: Payer: Self-pay

## 2023-08-10 DIAGNOSIS — C3411 Malignant neoplasm of upper lobe, right bronchus or lung: Secondary | ICD-10-CM | POA: Diagnosis not present

## 2023-08-10 DIAGNOSIS — Z510 Encounter for antineoplastic radiation therapy: Secondary | ICD-10-CM | POA: Diagnosis not present

## 2023-08-10 LAB — URINE CULTURE: Culture: >=100000 — AB

## 2023-08-10 LAB — RAD ONC ARIA SESSION SUMMARY
Course Elapsed Days: 43
Plan Fractions Treated to Date: 19
Plan Prescribed Dose Per Fraction: 2 Gy
Plan Total Fractions Prescribed: 35
Plan Total Prescribed Dose: 70 Gy
Reference Point Dosage Given to Date: 38 Gy
Reference Point Session Dosage Given: 2 Gy
Session Number: 19

## 2023-08-11 ENCOUNTER — Other Ambulatory Visit: Payer: Self-pay

## 2023-08-11 ENCOUNTER — Ambulatory Visit
Admission: RE | Admit: 2023-08-11 | Discharge: 2023-08-11 | Disposition: A | Discharge status: Home / Self Care | Payer: PPO | Source: Ambulatory Visit | Attending: Radiation Oncology | Admitting: Radiation Oncology

## 2023-08-11 DIAGNOSIS — C3411 Malignant neoplasm of upper lobe, right bronchus or lung: Secondary | ICD-10-CM | POA: Diagnosis not present

## 2023-08-11 DIAGNOSIS — Z510 Encounter for antineoplastic radiation therapy: Secondary | ICD-10-CM | POA: Diagnosis not present

## 2023-08-11 LAB — RAD ONC ARIA SESSION SUMMARY
Course Elapsed Days: 44
Plan Fractions Treated to Date: 20
Plan Prescribed Dose Per Fraction: 2 Gy
Plan Total Fractions Prescribed: 35
Plan Total Prescribed Dose: 70 Gy
Reference Point Dosage Given to Date: 40 Gy
Reference Point Session Dosage Given: 2 Gy
Session Number: 20

## 2023-08-12 ENCOUNTER — Inpatient Hospital Stay: Payer: PPO

## 2023-08-12 ENCOUNTER — Ambulatory Visit
Admission: RE | Admit: 2023-08-12 | Discharge: 2023-08-12 | Disposition: A | Discharge status: Home / Self Care | Payer: PPO | Source: Ambulatory Visit | Attending: Radiation Oncology | Admitting: Radiation Oncology

## 2023-08-12 ENCOUNTER — Inpatient Hospital Stay: Payer: PPO | Admitting: Oncology

## 2023-08-12 ENCOUNTER — Other Ambulatory Visit: Payer: Self-pay

## 2023-08-12 VITALS — BP 133/73 | HR 106 | Temp 98.8°F | Resp 20 | Wt 193.5 lb

## 2023-08-12 DIAGNOSIS — C3411 Malignant neoplasm of upper lobe, right bronchus or lung: Secondary | ICD-10-CM

## 2023-08-12 DIAGNOSIS — Z510 Encounter for antineoplastic radiation therapy: Secondary | ICD-10-CM | POA: Diagnosis not present

## 2023-08-12 LAB — RAD ONC ARIA SESSION SUMMARY
Course Elapsed Days: 45
Plan Fractions Treated to Date: 21
Plan Prescribed Dose Per Fraction: 2 Gy
Plan Total Fractions Prescribed: 35
Plan Total Prescribed Dose: 70 Gy
Reference Point Dosage Given to Date: 42 Gy
Reference Point Session Dosage Given: 2 Gy
Session Number: 21

## 2023-08-12 LAB — CMP (CANCER CENTER ONLY)
ALT: 29 U/L (ref 0–44)
AST: 24 U/L (ref 15–41)
Albumin: 3 g/dL — ABNORMAL LOW (ref 3.5–5.0)
Alkaline Phosphatase: 42 U/L (ref 38–126)
Anion gap: 11 (ref 5–15)
BUN: 9 mg/dL (ref 8–23)
CO2: 21 mmol/L — ABNORMAL LOW (ref 22–32)
Calcium: 8.5 mg/dL — ABNORMAL LOW (ref 8.9–10.3)
Chloride: 104 mmol/L (ref 98–111)
Creatinine: 0.62 mg/dL (ref 0.44–1.00)
GFR, Estimated: >60 mL/min (ref >60)
Glucose, Bld: 296 mg/dL — ABNORMAL HIGH (ref 70–99)
Potassium: 3.3 mmol/L — ABNORMAL LOW (ref 3.5–5.1)
Sodium: 136 mmol/L (ref 135–145)
Total Bilirubin: 0.6 mg/dL (ref 0.0–1.2)
Total Protein: 5.8 g/dL — ABNORMAL LOW (ref 6.5–8.1)

## 2023-08-12 LAB — CBC WITH DIFFERENTIAL (CANCER CENTER ONLY)
Abs Immature Granulocytes: 0.03 10*3/uL (ref 0.00–0.07)
Basophils Absolute: 0 10*3/uL (ref 0.0–0.1)
Basophils Relative: 1 %
Eosinophils Absolute: 0 10*3/uL (ref 0.0–0.5)
Eosinophils Relative: 1 %
HCT: 30 % — ABNORMAL LOW (ref 36.0–46.0)
Hemoglobin: 9.6 g/dL — ABNORMAL LOW (ref 12.0–15.0)
Immature Granulocytes: 2 %
Lymphocytes Relative: 20 %
Lymphs Abs: 0.4 10*3/uL — ABNORMAL LOW (ref 0.7–4.0)
MCH: 28.9 pg (ref 26.0–34.0)
MCHC: 32 g/dL (ref 30.0–36.0)
MCV: 90.4 fL (ref 80.0–100.0)
Monocytes Absolute: 0.4 10*3/uL (ref 0.1–1.0)
Monocytes Relative: 20 %
Neutro Abs: 1 10*3/uL — ABNORMAL LOW (ref 1.7–7.7)
Neutrophils Relative %: 56 %
Platelet Count: 151 10*3/uL (ref 150–400)
RBC: 3.32 MIL/uL — ABNORMAL LOW (ref 3.87–5.11)
RDW: 20.7 % — ABNORMAL HIGH (ref 11.5–15.5)
WBC Count: 1.7 10*3/uL — ABNORMAL LOW (ref 4.0–10.5)
nRBC: 0 % (ref 0.0–0.2)

## 2023-08-12 MED ORDER — SODIUM CHLORIDE 0.9 % IV SOLN
INTRAVENOUS | Status: DC
Start: 2023-08-12 — End: 2023-08-12
  Filled 2023-08-12: qty 250

## 2023-08-12 MED ORDER — SODIUM CHLORIDE 0.9 % IV SOLN
202.2000 mg | Freq: Once | INTRAVENOUS | Status: AC
  Administered 2023-08-12: 200 mg via INTRAVENOUS
  Filled 2023-08-12: qty 20

## 2023-08-12 MED ORDER — DIPHENHYDRAMINE HCL 50 MG/ML IJ SOLN
25.0000 mg | Freq: Once | INTRAMUSCULAR | Status: AC
  Administered 2023-08-12: 25 mg via INTRAVENOUS
  Filled 2023-08-12: qty 1

## 2023-08-12 MED ORDER — SODIUM CHLORIDE 0.9 % IV SOLN
45.0000 mg/m2 | Freq: Once | INTRAVENOUS | Status: AC
  Administered 2023-08-12: 90 mg via INTRAVENOUS
  Filled 2023-08-12: qty 15

## 2023-08-12 MED ORDER — HEPARIN SOD (PORK) LOCK FLUSH 100 UNIT/ML IV SOLN
500.0000 [IU] | Freq: Once | INTRAVENOUS | Status: AC | PRN
  Administered 2023-08-12: 500 [IU]
  Filled 2023-08-12: qty 5

## 2023-08-12 MED ORDER — DEXAMETHASONE SODIUM PHOSPHATE 10 MG/ML IJ SOLN
4.0000 mg | Freq: Once | INTRAMUSCULAR | Status: AC
  Administered 2023-08-12: 4 mg via INTRAVENOUS
  Filled 2023-08-12: qty 1

## 2023-08-12 MED ORDER — FAMOTIDINE IN NACL 20-0.9 MG/50ML-% IV SOLN
20.0000 mg | Freq: Once | INTRAVENOUS | Status: AC
  Administered 2023-08-12: 20 mg via INTRAVENOUS
  Filled 2023-08-12: qty 50

## 2023-08-12 MED ORDER — PALONOSETRON HCL INJECTION 0.25 MG/5ML
0.2500 mg | Freq: Once | INTRAVENOUS | Status: AC
  Administered 2023-08-12: 0.25 mg via INTRAVENOUS
  Filled 2023-08-12: qty 5

## 2023-08-12 NOTE — Patient Instructions (Signed)
 [facility] - A DEPT OF [facility]  Discharge Instructions: Thank you for choosing [facility] to provide your oncology and hematology care.  If you have a lab appointment with the Cancer Center, please go directly to the Cancer Center and check in at the registration area.  Wear comfortable clothing and clothing appropriate for easy access to any Portacath or PICC line.   We strive to give you quality time with your provider. You may need to reschedule your appointment if you arrive late (15 or more minutes).  Arriving late affects you and other patients whose appointments are after yours.  Also, if you miss three or more appointments without notifying the office, you may be dismissed from the clinic at the provider's discretion.      For prescription refill requests, have your pharmacy contact our office and allow 72 hours for refills to be completed.    Today you received the following chemotherapy and/or immunotherapy agents Carboplatin and Taxol.      To help prevent nausea and vomiting after your treatment, we encourage you to take your nausea medication as directed.  BELOW ARE SYMPTOMS THAT SHOULD BE REPORTED IMMEDIATELY: *FEVER GREATER THAN 100.4 F (38 C) OR HIGHER *CHILLS OR SWEATING *NAUSEA AND VOMITING THAT IS NOT CONTROLLED WITH YOUR NAUSEA MEDICATION *UNUSUAL SHORTNESS OF BREATH *UNUSUAL BRUISING OR BLEEDING *URINARY PROBLEMS (pain or burning when urinating, or frequent urination) *BOWEL PROBLEMS (unusual diarrhea, constipation, pain near the anus) TENDERNESS IN MOUTH AND THROAT WITH OR WITHOUT PRESENCE OF ULCERS (sore throat, sores in mouth, or a toothache) UNUSUAL RASH, SWELLING OR PAIN  UNUSUAL VAGINAL DISCHARGE OR ITCHING   Items with * indicate a potential emergency and should be followed up as soon as possible or go to the Emergency Department if any problems should occur.  Please show the CHEMOTHERAPY ALERT CARD or  IMMUNOTHERAPY ALERT CARD at check-in to the Emergency Department and triage nurse.  Should you have questions after your visit or need to cancel or reschedule your appointment, please contact [facility] - A DEPT OF [facility]  [phone] and follow the prompts.  Office hours are 8:00 a.m. to 4:30 p.m. Monday - Friday. Please note that voicemails left after 4:00 p.m. may not be returned until the following business day.  We are closed weekends and major holidays. You have access to a nurse at all times for urgent questions. Please call the main number to the clinic [phone] and follow the prompts.  For any non-urgent questions, you may also contact your provider using MyChart. We now offer e-Visits for anyone 36 and older to request care online for non-urgent symptoms. For details visit [email or web address].   Also download the MyChart app! Go to the app store, search "MyChart", open the app, select [place], and log in with your MyChart username and password.

## 2023-08-13 ENCOUNTER — Other Ambulatory Visit: Payer: Self-pay | Admitting: Internal Medicine

## 2023-08-13 DIAGNOSIS — C3411 Malignant neoplasm of upper lobe, right bronchus or lung: Secondary | ICD-10-CM

## 2023-08-13 LAB — CULTURE, BLOOD (ROUTINE X 2)
Culture: NO GROWTH
Culture: NO GROWTH
Special Requests: ADEQUATE

## 2023-08-15 ENCOUNTER — Encounter: Payer: Self-pay | Admitting: Internal Medicine

## 2023-08-15 ENCOUNTER — Encounter: Payer: Self-pay | Admitting: Oncology

## 2023-08-15 ENCOUNTER — Ambulatory Visit
Admission: RE | Admit: 2023-08-15 | Discharge: 2023-08-15 | Disposition: A | Discharge status: Home / Self Care | Payer: PPO | Source: Ambulatory Visit | Attending: Radiation Oncology | Admitting: Radiation Oncology

## 2023-08-15 ENCOUNTER — Other Ambulatory Visit: Payer: Self-pay

## 2023-08-15 DIAGNOSIS — D61818 Other pancytopenia: Secondary | ICD-10-CM | POA: Diagnosis not present

## 2023-08-15 DIAGNOSIS — C3400 Malignant neoplasm of unspecified main bronchus: Secondary | ICD-10-CM | POA: Diagnosis not present

## 2023-08-15 DIAGNOSIS — F1120 Opioid dependence, uncomplicated: Secondary | ICD-10-CM | POA: Diagnosis not present

## 2023-08-15 DIAGNOSIS — Z86711 Personal history of pulmonary embolism: Secondary | ICD-10-CM | POA: Diagnosis not present

## 2023-08-15 DIAGNOSIS — Z7901 Long term (current) use of anticoagulants: Secondary | ICD-10-CM | POA: Diagnosis not present

## 2023-08-15 DIAGNOSIS — G894 Chronic pain syndrome: Secondary | ICD-10-CM | POA: Diagnosis not present

## 2023-08-15 DIAGNOSIS — C3411 Malignant neoplasm of upper lobe, right bronchus or lung: Secondary | ICD-10-CM | POA: Diagnosis not present

## 2023-08-15 DIAGNOSIS — Z6834 Body mass index (BMI) 34.0-34.9, adult: Secondary | ICD-10-CM | POA: Diagnosis not present

## 2023-08-15 DIAGNOSIS — R600 Localized edema: Secondary | ICD-10-CM | POA: Diagnosis not present

## 2023-08-15 DIAGNOSIS — I5030 Unspecified diastolic (congestive) heart failure: Secondary | ICD-10-CM | POA: Diagnosis not present

## 2023-08-15 DIAGNOSIS — D649 Anemia, unspecified: Secondary | ICD-10-CM | POA: Diagnosis not present

## 2023-08-15 DIAGNOSIS — Z510 Encounter for antineoplastic radiation therapy: Secondary | ICD-10-CM | POA: Diagnosis not present

## 2023-08-15 DIAGNOSIS — M5441 Lumbago with sciatica, right side: Secondary | ICD-10-CM | POA: Diagnosis not present

## 2023-08-15 DIAGNOSIS — E1165 Type 2 diabetes mellitus with hyperglycemia: Secondary | ICD-10-CM | POA: Diagnosis not present

## 2023-08-15 DIAGNOSIS — N390 Urinary tract infection, site not specified: Secondary | ICD-10-CM | POA: Diagnosis not present

## 2023-08-15 DIAGNOSIS — Z09 Encounter for follow-up examination after completed treatment for conditions other than malignant neoplasm: Secondary | ICD-10-CM | POA: Diagnosis not present

## 2023-08-15 LAB — RAD ONC ARIA SESSION SUMMARY
Course Elapsed Days: 48
Plan Fractions Treated to Date: 22
Plan Prescribed Dose Per Fraction: 2 Gy
Plan Total Fractions Prescribed: 35
Plan Total Prescribed Dose: 70 Gy
Reference Point Dosage Given to Date: 44 Gy
Reference Point Session Dosage Given: 2 Gy
Session Number: 22

## 2023-08-16 ENCOUNTER — Other Ambulatory Visit: Payer: Self-pay

## 2023-08-16 ENCOUNTER — Ambulatory Visit
Admission: RE | Admit: 2023-08-16 | Discharge: 2023-08-16 | Disposition: A | Discharge status: Home / Self Care | Payer: PPO | Source: Ambulatory Visit | Attending: Radiation Oncology | Admitting: Radiation Oncology

## 2023-08-16 ENCOUNTER — Ambulatory Visit: Payer: PPO

## 2023-08-16 DIAGNOSIS — E1165 Type 2 diabetes mellitus with hyperglycemia: Secondary | ICD-10-CM | POA: Diagnosis not present

## 2023-08-16 DIAGNOSIS — Z510 Encounter for antineoplastic radiation therapy: Secondary | ICD-10-CM | POA: Diagnosis not present

## 2023-08-16 DIAGNOSIS — C3411 Malignant neoplasm of upper lobe, right bronchus or lung: Secondary | ICD-10-CM | POA: Diagnosis not present

## 2023-08-16 LAB — RAD ONC ARIA SESSION SUMMARY
Course Elapsed Days: 49
Plan Fractions Treated to Date: 23
Plan Prescribed Dose Per Fraction: 2 Gy
Plan Total Fractions Prescribed: 35
Plan Total Prescribed Dose: 70 Gy
Reference Point Dosage Given to Date: 46 Gy
Reference Point Session Dosage Given: 2 Gy
Session Number: 23

## 2023-08-17 ENCOUNTER — Ambulatory Visit: Payer: PPO

## 2023-08-17 ENCOUNTER — Ambulatory Visit

## 2023-08-18 ENCOUNTER — Ambulatory Visit
Admission: RE | Admit: 2023-08-18 | Discharge: 2023-08-18 | Disposition: A | Discharge status: Home / Self Care | Payer: PPO | Source: Ambulatory Visit | Attending: Radiation Oncology | Admitting: Radiation Oncology

## 2023-08-18 ENCOUNTER — Other Ambulatory Visit: Payer: Self-pay

## 2023-08-18 ENCOUNTER — Ambulatory Visit: Payer: PPO

## 2023-08-18 DIAGNOSIS — C3411 Malignant neoplasm of upper lobe, right bronchus or lung: Secondary | ICD-10-CM | POA: Diagnosis not present

## 2023-08-18 DIAGNOSIS — Z510 Encounter for antineoplastic radiation therapy: Secondary | ICD-10-CM | POA: Diagnosis not present

## 2023-08-18 LAB — RAD ONC ARIA SESSION SUMMARY
Course Elapsed Days: 51
Plan Fractions Treated to Date: 24
Plan Prescribed Dose Per Fraction: 2 Gy
Plan Total Fractions Prescribed: 35
Plan Total Prescribed Dose: 70 Gy
Reference Point Dosage Given to Date: 48 Gy
Reference Point Session Dosage Given: 2 Gy
Session Number: 24

## 2023-08-19 ENCOUNTER — Ambulatory Visit
Admission: RE | Admit: 2023-08-19 | Discharge: 2023-08-19 | Disposition: A | Discharge status: Home / Self Care | Payer: PPO | Source: Ambulatory Visit | Attending: Radiation Oncology | Admitting: Radiation Oncology

## 2023-08-19 ENCOUNTER — Encounter: Payer: Self-pay | Admitting: Oncology

## 2023-08-19 ENCOUNTER — Inpatient Hospital Stay (HOSPITAL_BASED_OUTPATIENT_CLINIC_OR_DEPARTMENT_OTHER): Admitting: Oncology

## 2023-08-19 ENCOUNTER — Inpatient Hospital Stay

## 2023-08-19 ENCOUNTER — Other Ambulatory Visit: Payer: Self-pay

## 2023-08-19 ENCOUNTER — Ambulatory Visit: Payer: PPO

## 2023-08-19 VITALS — BP 91/47 | HR 111 | Temp 98.2°F | Resp 18 | Ht 63.0 in | Wt 192.7 lb

## 2023-08-19 VITALS — BP 115/67 | HR 103 | Resp 18

## 2023-08-19 DIAGNOSIS — Z510 Encounter for antineoplastic radiation therapy: Secondary | ICD-10-CM | POA: Diagnosis not present

## 2023-08-19 DIAGNOSIS — D701 Agranulocytosis secondary to cancer chemotherapy: Secondary | ICD-10-CM | POA: Diagnosis not present

## 2023-08-19 DIAGNOSIS — Z5111 Encounter for antineoplastic chemotherapy: Secondary | ICD-10-CM

## 2023-08-19 DIAGNOSIS — C3411 Malignant neoplasm of upper lobe, right bronchus or lung: Secondary | ICD-10-CM

## 2023-08-19 DIAGNOSIS — T451X5A Adverse effect of antineoplastic and immunosuppressive drugs, initial encounter: Secondary | ICD-10-CM

## 2023-08-19 LAB — CMP (CANCER CENTER ONLY)
ALT: 23 U/L (ref 0–44)
AST: 28 U/L (ref 15–41)
Albumin: 2.9 g/dL — ABNORMAL LOW (ref 3.5–5.0)
Alkaline Phosphatase: 45 U/L (ref 38–126)
Anion gap: 9 (ref 5–15)
BUN: 8 mg/dL (ref 8–23)
CO2: 21 mmol/L — ABNORMAL LOW (ref 22–32)
Calcium: 8 mg/dL — ABNORMAL LOW (ref 8.9–10.3)
Chloride: 104 mmol/L (ref 98–111)
Creatinine: 0.67 mg/dL (ref 0.44–1.00)
GFR, Estimated: >60 mL/min (ref >60)
Glucose, Bld: 335 mg/dL — ABNORMAL HIGH (ref 70–99)
Potassium: 3.6 mmol/L (ref 3.5–5.1)
Sodium: 134 mmol/L — ABNORMAL LOW (ref 135–145)
Total Bilirubin: 0.7 mg/dL (ref 0.0–1.2)
Total Protein: 5.8 g/dL — ABNORMAL LOW (ref 6.5–8.1)

## 2023-08-19 LAB — RAD ONC ARIA SESSION SUMMARY
Course Elapsed Days: 52
Plan Fractions Treated to Date: 25
Plan Prescribed Dose Per Fraction: 2 Gy
Plan Total Fractions Prescribed: 35
Plan Total Prescribed Dose: 70 Gy
Reference Point Dosage Given to Date: 50 Gy
Reference Point Session Dosage Given: 2 Gy
Session Number: 25

## 2023-08-19 LAB — CBC WITH DIFFERENTIAL (CANCER CENTER ONLY)
Abs Immature Granulocytes: 0.06 10*3/uL (ref 0.00–0.07)
Basophils Absolute: 0 10*3/uL (ref 0.0–0.1)
Basophils Relative: 0 %
Eosinophils Absolute: 0 10*3/uL (ref 0.0–0.5)
Eosinophils Relative: 0 %
HCT: 28 % — ABNORMAL LOW (ref 36.0–46.0)
Hemoglobin: 9.1 g/dL — ABNORMAL LOW (ref 12.0–15.0)
Immature Granulocytes: 3 %
Lymphocytes Relative: 13 %
Lymphs Abs: 0.3 10*3/uL — ABNORMAL LOW (ref 0.7–4.0)
MCH: 29.6 pg (ref 26.0–34.0)
MCHC: 32.5 g/dL (ref 30.0–36.0)
MCV: 91.2 fL (ref 80.0–100.0)
Monocytes Absolute: 0.3 10*3/uL (ref 0.1–1.0)
Monocytes Relative: 13 %
Neutro Abs: 1.6 10*3/uL — ABNORMAL LOW (ref 1.7–7.7)
Neutrophils Relative %: 71 %
Platelet Count: 135 10*3/uL — ABNORMAL LOW (ref 150–400)
RBC: 3.07 MIL/uL — ABNORMAL LOW (ref 3.87–5.11)
RDW: 21.4 % — ABNORMAL HIGH (ref 11.5–15.5)
WBC Count: 2.3 10*3/uL — ABNORMAL LOW (ref 4.0–10.5)
nRBC: 0.9 % — ABNORMAL HIGH (ref 0.0–0.2)

## 2023-08-19 MED ORDER — SODIUM CHLORIDE 0.9 % IV SOLN
45.0000 mg/m2 | Freq: Once | INTRAVENOUS | Status: AC
  Administered 2023-08-19: 90 mg via INTRAVENOUS
  Filled 2023-08-19: qty 15

## 2023-08-19 MED ORDER — FAMOTIDINE IN NACL 20-0.9 MG/50ML-% IV SOLN
20.0000 mg | Freq: Once | INTRAVENOUS | Status: AC
  Administered 2023-08-19: 20 mg via INTRAVENOUS
  Filled 2023-08-19: qty 50

## 2023-08-19 MED ORDER — DEXAMETHASONE SODIUM PHOSPHATE 10 MG/ML IJ SOLN
4.0000 mg | Freq: Once | INTRAMUSCULAR | Status: AC
  Administered 2023-08-19: 4 mg via INTRAVENOUS
  Filled 2023-08-19: qty 1

## 2023-08-19 MED ORDER — HEPARIN SOD (PORK) LOCK FLUSH 100 UNIT/ML IV SOLN
500.0000 [IU] | Freq: Once | INTRAVENOUS | Status: DC | PRN
Start: 2023-08-19 — End: 2023-08-19
  Filled 2023-08-19: qty 5

## 2023-08-19 MED ORDER — SODIUM CHLORIDE 0.9 % IV SOLN
INTRAVENOUS | Status: DC
  Filled 2023-08-19: qty 250

## 2023-08-19 MED ORDER — PALONOSETRON HCL INJECTION 0.25 MG/5ML
0.2500 mg | Freq: Once | INTRAVENOUS | Status: AC
  Administered 2023-08-19: 0.25 mg via INTRAVENOUS
  Filled 2023-08-19: qty 5

## 2023-08-19 MED ORDER — SODIUM CHLORIDE 0.9% FLUSH
10.0000 mL | INTRAVENOUS | Status: DC | PRN
Start: 2023-08-19 — End: 2023-08-19
  Filled 2023-08-19: qty 10

## 2023-08-19 MED ORDER — SODIUM CHLORIDE 0.9 % IV SOLN
200.0000 mg | Freq: Once | INTRAVENOUS | Status: AC
  Administered 2023-08-19: 200 mg via INTRAVENOUS
  Filled 2023-08-19: qty 20

## 2023-08-19 MED ORDER — DIPHENHYDRAMINE HCL 50 MG/ML IJ SOLN
25.0000 mg | Freq: Once | INTRAMUSCULAR | Status: AC
  Administered 2023-08-19: 25 mg via INTRAVENOUS
  Filled 2023-08-19: qty 1

## 2023-08-19 NOTE — Progress Notes (Signed)
 Hematology/Oncology Consult note [facility]  [phone] [phone]  Patient Care Team: Barbette Reichmann, MD as PCP - General (Internal Medicine) Glory Buff, RN as Oncology Nurse Navigator Creig Hines, MD as Consulting Physician (Oncology)   Name of the patient: Kara [name]  [id number]  22-Jan-1951   Date of visit: 08/19/23  Diagnosis-  right upper lobe lung cancer s/p SBRT now with mediastinal recurrence     Chief complaint/ Reason for visit-on treatment assessment prior to cycle 7 of weekly CarboTaxol chemotherapy  Heme/Onc history: Patient is a 73 year old female who underwent SBRT for presumed right upper lobe lung cancer in December 2022. She was in remission until November 2024 when she was noted to have hypermetabolic in the right hilum as well as mediastinal region. 15 x 12 mm nodular opacity in the dome of the right diaphragm. 10 mm left cervical lymph node. 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 Right pleural thoracentesis negative for malignancy. Plan is for concurrent chemoradiation with weekly CarboTaxol.   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  MSI not high.  CT DNA tumor fraction 7%  Interval history-she is tolerating chemotherapy well so far.  She does report having pleuritic chest wall pain ever since she had her thoracentesis in December 2020 for especially when she sneezes or takes a deep breath.  ECOG PS- 1 Pain scale- 3   Review of systems- Review of Systems  Constitutional:  Negative for chills, fever, malaise/fatigue and weight loss.  HENT:  Negative for congestion, ear discharge and nosebleeds.   Eyes:  Negative for blurred vision.  Respiratory:  Negative for cough, hemoptysis, sputum production, shortness of breath and  wheezing.        Pleuritic chest wall pain  Cardiovascular:  Negative for chest pain, palpitations, orthopnea and claudication.  Gastrointestinal:  Negative for abdominal pain, blood in stool, constipation, diarrhea, heartburn, melena, nausea and vomiting.  Genitourinary:  Negative for dysuria, flank pain, frequency, hematuria and urgency.  Musculoskeletal:  Negative for back pain, joint pain and myalgias.  Skin:  Negative for rash.  Neurological:  Negative for dizziness, tingling, focal weakness, seizures, weakness and headaches.  Endo/Heme/Allergies:  Does not bruise/bleed easily.  Psychiatric/Behavioral:  Negative for depression and suicidal ideas. The patient does not have insomnia.       Allergies  Allergen Reactions   Adhesive [Tape] Other (See Comments)    Pt reports, when removing tape from skin most tapes pull her skin off. Ok to use paper tape   Avapro [Irbesartan] Anaphylaxis    Facial swelling   Morphine And Codeine Nausea And Vomiting   Other Hives    Chlorhexadine wipes/CHG wipes,    Latex Rash    Exam gloves/ does not react around elastic and lips don't swell when blowing balloons     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, 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   CHF (congestive heart failure) (HCC)    followed colon resection, "wouldn't let me get up"   Chronic anticoagulation    Degenerative disc disease, lumbar    Diverticulitis 2018   Diverticulosis    DVT of lower extremity, bilateral (HCC) 2018   Essential hypertension    History of  being hospitalized    1 month ago for 3 days, vomiting, and kink in upper intestine   History of hiatal hernia    Mediastinal adenopathy 04/2023   MRSA nasal colonization    Non-small cell lung cancer (HCC) 2022   right   Obesity (BMI 30-39.9)    Osteoarthritis    Osteoporosis    PE (pulmonary thromboembolism) (HCC) 2018   Pneumonia    Small bowel obstruction  (HCC) 03/20/2023   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HCC)    Type 2 diabetes mellitus with obesity (HCC)    Vertigo      Past Surgical History:  Procedure Laterality Date   APPENDECTOMY N/A 02/24/2017   Procedure: Incidental  APPENDECTOMY;  Surgeon: Ricarda Frame, MD;  Location: [name];  Service: General;  Laterality: N/A;   CARPAL TUNNEL RELEASE Bilateral    CATARACT EXTRACTION W/PHACO Left 11/21/2018   Procedure: CATARACT EXTRACTION PHACO AND INTRAOCULAR LENS PLACEMENT (IOC) LEFT DIABETES;  Surgeon: Galen Manila, MD;  Location: [facility];  Service: Ophthalmology;  Laterality: Left;  latex sensitivity Diabetic - oral meds   CATARACT EXTRACTION W/PHACO Right 12/12/2018   Procedure: CATARACT EXTRACTION PHACO AND INTRAOCULAR LENS PLACEMENT (IOC) RIGHT DIABETES;  Surgeon: Galen Manila, MD;  Location: [facility];  Service: Ophthalmology;  Laterality: Right;  Diabetes-oral med Latex sensitiviy   COLON RESECTION SIGMOID N/A 11/02/2016   Procedure: COLON RESECTION SIGMOID;  Surgeon: Ricarda Frame, MD;  Location: [name];  Service: General;  Laterality: N/A;   COLON SURGERY  11/02/2016   COLONOSCOPY WITH PROPOFOL N/A 02/03/2017   Procedure: COLONOSCOPY WITH PROPOFOL;  Surgeon: Toney Reil, MD;  Location: [name];  Service: Gastroenterology;  Laterality: N/A;   COLOSTOMY N/A 11/02/2016   Procedure: COLOSTOMY;  Surgeon: Ricarda Frame, MD;  Location: [name];  Service: General;  Laterality: N/A;   COLOSTOMY REVERSAL N/A 02/24/2017   Procedure: COLOSTOMY REVERSAL, ostomy takedown, spleenic flexure mobilization, excision rectal stump/distal sigmoid, anastomosis with suture reinforcement;  Surgeon: Ricarda Frame, MD;  Location: [name];  Service: General;  Laterality: N/A;   ENDOBRONCHIAL ULTRASOUND N/A 05/16/2023   Procedure: ENDOBRONCHIAL ULTRASOUND;  Surgeon: [name], MD;  Location: [name];  Service: Cardiopulmonary;   Laterality: N/A;   FLEXIBLE BRONCHOSCOPY N/A 05/16/2023   Procedure: FLEXIBLE BRONCHOSCOPY;  Surgeon: [name], MD;  Location: [name];  Service: Cardiopulmonary;  Laterality: N/A;   ILEO LOOP DIVERSION N/A 02/24/2017   Procedure: ILEO LOOP COLOSTOMY;  Surgeon: Ricarda Frame, MD;  Location: [name];  Service: General;  Laterality: N/A;   ILEOSTOMY CLOSURE N/A 06/01/2017   Procedure: LOOP ILEOSTOMY TAKEDOWN;  Surgeon: Ricarda Frame, MD;  Location: [name];  Service: General;  Laterality: N/A;   IR IMAGING GUIDED PORT INSERTION  06/14/2023   IVC FILTER INSERTION Right 10/2016   IVC FILTER REMOVAL N/A 08/09/2017   Procedure: IVC FILTER REMOVAL;  Surgeon: Renford Dills, MD;  Location: [name];  Service: Cardiovascular;  Laterality: N/A;   KNEE SURGERY Right    torn menicus   LAPAROSCOPY N/A 02/24/2017   Procedure: LAPAROSCOPY DIAGNOSTIC;  Surgeon: Ricarda Frame, MD;  Location: [name];  Service: General;  Laterality: N/A;   LYSIS OF ADHESION N/A 02/24/2017   Procedure: LYSIS OF ADHESION;  Surgeon: Ricarda Frame, MD;  Location: [name];  Service: General;  Laterality: N/A;   PULMONARY VENOGRAPHY N/A 11/19/2016   Procedure: Pulmonary Venography; IVC filter placement; possible pulmonary thrombectomy;  Surgeon: Renford Dills, MD;  Location:  [name];  Service: Cardiovascular;  Laterality: N/A;   SACROPLASTY N/A 11/08/2017   Procedure: SACROPLASTY S2;  Surgeon: Kennedy Bucker, MD;  Location: [name];  Service: Orthopedics;  Laterality: N/A;   SIGMOIDOSCOPY N/A 11/13/2016   Procedure: endoscopic  flexible SIGMOIDOSCOPY;  Surgeon: Lattie Haw, MD;  Location: [name];  Service: General;  Laterality: N/A;   SIGMOIDOSCOPY N/A 05/19/2017   Procedure: Arnell Sieving;  Surgeon: Ricarda Frame, MD;  Location: [name];  Service: General;  Laterality: N/A;   VIDEO BRONCHOSCOPY WITH ENDOBRONCHIAL NAVIGATION N/A 02/23/2021    Procedure: ROBOTIC VIDEO BRONCHOSCOPY WITH ENDOBRONCHIAL NAVIGATION;  Surgeon: [name], MD;  Location: [name];  Service: Pulmonary;  Laterality: N/A;    Social History   Socioeconomic History   Marital status: Married    Spouse name: Broadus John   Number of children: 1   Years of education: Not on file   Highest education level: Not on file  Occupational History   Not on file  Tobacco Use   Smoking status: Never    Passive exposure: Current (husband smokes)   Smokeless tobacco: Never  Vaping Use   Vaping status: Never Used  Substance and Sexual Activity   Alcohol use: No    Alcohol/week: 0.0 standard drinks of alcohol   Drug use: No   Sexual activity: Never  Other Topics Concern   Not on file  Social History Narrative   Not on file   Social Drivers of Health   Financial Resource Strain: Low Risk  (05/19/2023)   Received from [facility]   Overall Financial Resource Strain (CARDIA)    Difficulty of Paying Living Expenses: Not hard at all  Food Insecurity: No Food Insecurity (05/19/2023)   Received from [facility]   Hunger Vital Sign    Worried About Running Out of Food in the Last Year: Never true    Ran Out of Food in the Last Year: Never true  Transportation Needs: No Transportation Needs (05/19/2023)   Received from [facility] - Transportation    In the past 12 months, has lack of transportation kept you from medical appointments or from getting medications?: No    Lack of Transportation (Non-Medical): No  Physical Activity: Not on file  Stress: Not on file  Social Connections: Not on file  Intimate Partner Violence: Not At Risk (05/09/2023)   Humiliation, Afraid, Rape, and Kick questionnaire    Fear of Current or Ex-Partner: No    Emotionally Abused: No    Physically Abused: No    Sexually Abused: No    Family History  Problem Relation Age of Onset   Diabetes Mother    Heart disease  Mother    Cancer Mother    Breast cancer Mother        >50   Heart disease Father    Diabetes Sister    Heart disease Sister      Current Outpatient Medications:    azithromycin (ZITHROMAX) 250 MG tablet, Two tabs po day 1 and one tab po daily afterwards, Disp: 6 each, Rfl: 0   bisacodyl (DULCOLAX) 10 MG suppository, Place 1 suppository (10 mg total) rectally as needed for moderate constipation., Disp: 12 suppository, Rfl: 0   candesartan (ATACAND) 16 MG tablet, Take 16 mg by mouth in the morning., Disp: , Rfl:    cholecalciferol (VITAMIN D) 25 MCG (1000 UNIT) tablet, Take 1,000 Units by mouth in the morning.,  Disp: , Rfl:    Cyanocobalamin (VITAMIN B-12) 5000 MCG TBDP, Take 5,000 mcg by mouth in the morning., Disp: , Rfl:    cyclobenzaprine (FLEXERIL) 10 MG tablet, Take 10 mg by mouth at bedtime., Disp: , Rfl:    dexamethasone (DECADRON) 4 MG tablet, TAKE 2 TABLETS BY MOUTH ONCE DAILY FOR 2DAYS. START DAY AFTER CHEMO. TAKE W/ FOOD, Disp: 30 tablet, Rfl: 1   Dulaglutide (TRULICITY) 1.5 MG/0.5ML SOAJ, Inject 1.5 mg into the skin once a week. Saturday, Disp: , Rfl:    gabapentin (NEURONTIN) 100 MG capsule, Take 200 mg by mouth with breakfast, with lunch, and with evening meal., Disp: , Rfl:    gabapentin (NEURONTIN) 300 MG capsule, Take 300 mg by mouth at bedtime., Disp: , Rfl:    glipiZIDE (GLUCOTROL) 10 MG tablet, Take 10 mg by mouth daily before breakfast., Disp: , Rfl:    HYDROcodone-acetaminophen (NORCO/VICODIN) 5-325 MG tablet, Take 1 tablet by mouth every 6 (six) hours as needed for moderate pain., Disp: , Rfl:    Magnesium Oxide -Mg Supplement 500 MG TABS, Take 1 tablet by mouth at bedtime., Disp: , Rfl:    ondansetron (ZOFRAN) 8 MG tablet, TAKE 1 TABLET BY MOUTH EVERY 8 HOURS AS NEEDED FOR NAUSEA OR VOMITING. START ON THE 3RD DAY AFTER CHEMOTHERAPY, Disp: 30 tablet, Rfl: 1   ONETOUCH ULTRA TEST test strip, , Disp: , Rfl:    oxybutynin (DITROPAN) 5 MG tablet, Take 1 tablet by mouth 2  (two) times daily., Disp: , Rfl:    potassium chloride SA (KLOR-CON M) 20 MEQ tablet, Take 1 tablet (20 mEq total) by mouth daily., Disp: 30 tablet, Rfl: 1   prochlorperazine (COMPAZINE) 10 MG tablet, Take 1 tablet (10 mg total) by mouth every 6 (six) hours as needed for nausea or vomiting., Disp: 30 tablet, Rfl: 1   rOPINIRole (REQUIP) 1 MG tablet, Take 1 mg by mouth at bedtime., Disp: , Rfl:    sucralfate (CARAFATE) 1 g tablet, Take 1 tablet (1 g total) by mouth 3 (three) times daily with meals. Dissolve tablet in 4 tablesppons warm water swish and swallow 30 min before meals. (Patient taking differently: Take 1 g by mouth 3 (three) times daily with meals. Dissolve tablet in 4 tablespoons warm water swish and swallow 30 min before meals.), Disp: 90 tablet, Rfl: 0   warfarin (COUMADIN) 6 MG tablet, Take 6 mg by mouth at bedtime., Disp: , Rfl:  No current facility-administered medications for this visit.  Facility-Administered Medications Ordered in Other Visits:    0.9 %  sodium chloride infusion, , Intravenous, Continuous, Creig Hines, MD, Last Rate: 10 mL/hr at 07/15/23 1012, New Bag at 07/15/23 1012   0.9 %  sodium chloride infusion, , Intravenous, Continuous, Creig Hines, MD, Last Rate: 10 mL/hr at 08/19/23 1044, New Bag at 08/19/23 1044   CARBOplatin (PARAPLATIN) 200 mg in sodium chloride 0.9 % 100 mL chemo infusion, 200 mg, Intravenous, Once, Creig Hines, MD   famotidine (PEPCID) IVPB 20 mg premix, 20 mg, Intravenous, Once, Creig Hines, MD   heparin lock flush 100 unit/mL, 500 Units, Intracatheter, Once PRN, Creig Hines, MD   PACLitaxel (TAXOL) 90 mg in sodium chloride 0.9 % 250 mL chemo infusion (</= 80mg /m2), 45 mg/m2 (Treatment Plan Recorded), Intravenous, Once, Creig Hines, MD   sodium chloride flush (NS) 0.9 % injection 10 mL, 10 mL, Intracatheter, PRN, Creig Hines, MD  Physical exam:  Vitals:   08/19/23  0937  BP: (!) 91/47  Pulse: (!) 111  Resp: 18  Temp: 98.2  F (36.8 C)  TempSrc: Tympanic  SpO2: 99%  Weight: 192 lb 11.2 oz (87.4 kg)  Height: 5\' 3"  (1.6 m)   Physical Exam Cardiovascular:     Rate and Rhythm: Normal rate and regular rhythm.     Heart sounds: Normal heart sounds.  Pulmonary:     Effort: Pulmonary effort is normal.     Breath sounds: Normal breath sounds.  Skin:    General: Skin is warm and dry.  Neurological:     Mental Status: She is alert and oriented to person, place, and time.         Latest Ref Rng & Units 08/19/2023    9:03 AM  CMP  Glucose 70 - 99 mg/dL 130   BUN 8 - 23 mg/dL 8   Creatinine 8.65 - 7.84 mg/dL 6.96   Sodium 295 - 284 mmol/L 134   Potassium 3.5 - 5.1 mmol/L 3.6   Chloride 98 - 111 mmol/L 104   CO2 22 - 32 mmol/L 21   Calcium 8.9 - 10.3 mg/dL 8.0   Total Protein 6.5 - 8.1 g/dL 5.8   Total Bilirubin 0.0 - 1.2 mg/dL 0.7   Alkaline Phos 38 - 126 U/L 45   AST 15 - 41 U/L 28   ALT 0 - 44 U/L 23       Latest Ref Rng & Units 08/19/2023    9:03 AM  CBC  WBC 4.0 - 10.5 K/uL 2.3   Hemoglobin 12.0 - 15.0 g/dL 9.1   Hematocrit 13.2 - 46.0 % 28.0   Platelets 150 - 400 K/uL 135     No images are attached to the encounter.  CT Angio Chest PE W/Cm &/Or Wo Cm Result Date: 08/08/2023 CLINICAL DATA:  Dizziness, weakness, history of lung cancer, EXAM: CT ANGIOGRAPHY CHEST WITH CONTRAST TECHNIQUE: Multidetector CT imaging of the chest was performed using the standard protocol during bolus administration of intravenous contrast. Multiplanar CT image reconstructions and MIPs were obtained to evaluate the vascular anatomy. RADIATION DOSE REDUCTION: This exam was performed according to the departmental dose-optimization program which includes automated exposure control, adjustment of the mA and/or kV according to patient size and/or use of iterative reconstruction technique. CONTRAST:  75mL OMNIPAQUE IOHEXOL 350 MG/ML SOLN COMPARISON:  PET/CT 06/03/2023 and radiograph 05/16/2023 FINDINGS: Cardiovascular: No  central pulmonary embolism. Question nonocclusive filling defects within a segmental branch the right lower lobe pulmonary arteries (circa series 4/image 73). However evaluation is limited by significant artifact in this area. No pericardial effusion. Mild aortic atherosclerotic calcification. Right chest wall Port-A-Cath. Mediastinum/Nodes: Bowing of the posterior trachea compatible with expiratory phase. Esophagus is unremarkable. 11 mm subcarinal lymph node is unchanged from 06/03/2023 PET/CT. Lungs/Pleura: Unchanged right hilar mass/consolidation. This was hypermetabolic on PET/CT 06/03/2023. Mild mosaic attenuation of the lungs compatible with air trapping. Small right pleural effusion. No pneumothorax. Upper Abdomen: Cholelithiasis. No evidence of acute cholecystitis. Hepatic steatosis. No acute abnormality. Musculoskeletal: No acute fracture. Review of the MIP images confirms the above findings. IMPRESSION: 1. No central pulmonary embolism. Possible filling defects within a segmental branch of the right lower lobe pulmonary arteries is favored artifactual given significant motion in this area nonocclusive pulmonary embolism is considered less likely though unable to be excluded. 2. Unchanged right hilar mass/consolidation. This was hypermetabolic on PET/CT 06/03/2023. 3. Small right pleural effusion. 4. Mosaic attenuation of the lungs compatible with air trapping. 5.  Cholelithiasis. 6. Hepatic steatosis. 7. Aortic Atherosclerosis (ICD10-I70.0). Electronically Signed   By: Minerva Fester M.D.   On: 08/08/2023 03:23   US Venous Img Lower Bilateral Result Date: 08/08/2023 CLINICAL DATA:  Lower extremity pain EXAM: Bilateral  LOWER EXTREMITY VENOUS DOPPLER ULTRASOUND TECHNIQUE: Gray-scale sonography with compression, as well as color and duplex ultrasound, were performed to evaluate the deep venous system(s) from the level of the common femoral vein through the popliteal and proximal calf veins. COMPARISON:   ultrasound 04/10/2018 FINDINGS: VENOUS Normal compressibility of the common femoral, superficial femoral, and popliteal veins, as well as the visualized calf veins. Visualized portions of profunda femoral vein and great saphenous vein unremarkable. No filling defects to suggest DVT on grayscale or color Doppler imaging. Doppler waveforms show normal direction of venous flow, normal respiratory plasticity and response to augmentation. Limited views of the contralateral common femoral vein are unremarkable. OTHER None. Limitations: none IMPRESSION: Negative. Electronically Signed   By: Minerva Fester M.D.   On: 08/08/2023 02:10   CT ANGIO GI BLEED Addendum Date: 08/02/2023 ADDENDUM REPORT: 08/02/2023 15:41 ADDENDUM: Upon further review there is active arterial extravasation within a loop of bowel in the right lower quadrant at the site of prior distal small bowel anastomosis. Marliss Coots, MD [facility] Electronically Signed   By: Marliss Coots M.D.   On: 08/02/2023 15:41   Result Date: 08/02/2023 CLINICAL DATA:  73 year old female with history of lower gastrointestinal hemorrhage. EXAM: CTA ABDOMEN AND PELVIS WITHOUT AND WITH CONTRAST TECHNIQUE: Multidetector CT imaging of the abdomen and pelvis was performed using the standard protocol during bolus administration of intravenous contrast. Multiplanar reconstructed images and MIPs were obtained and reviewed to evaluate the vascular anatomy. RADIATION DOSE REDUCTION: This exam was performed according to the departmental dose-optimization program which includes automated exposure control, adjustment of the mA and/or kV according to patient size and/or use of iterative reconstruction technique. CONTRAST:  OMNIPAQUE IOHEXOL 350 MG/ML SOLN COMPARISON:  07/06/2023 FINDINGS: VASCULAR Aorta: Normal caliber aorta without aneurysm, dissection, vasculitis or significant stenosis. Mild scattered  atherosclerotic calcifications. Celiac: Patent without evidence of aneurysm, dissection, vasculitis or significant stenosis. SMA: Patent without evidence of aneurysm, dissection, vasculitis or significant stenosis. Renals: Single bilateral renal arteries are patent without evidence of aneurysm, dissection, vasculitis, fibromuscular dysplasia or significant stenosis. IMA: Patent without evidence of aneurysm, dissection, vasculitis or significant stenosis. Inflow: Patent without evidence of aneurysm, dissection, vasculitis or significant stenosis. Proximal Outflow: Bilateral common femoral and visualized portions of the superficial and profunda femoral arteries are patent without evidence of aneurysm, dissection, vasculitis or significant stenosis. Veins: No obvious venous abnormality within the limitations of this arterial phase study. Review of the MIP images confirms the above findings. NON-VASCULAR Lower chest: No acute abnormality. Hepatobiliary: No focal liver abnormality is seen. Unchanged appearance of multiple peripherally calcified gallstones layering in the gallbladder neck without evidence of gallbladder distension, wall thickening, or pericholecystic fluid. No intra or extrahepatic biliary ductal dilation. Pancreas: Unremarkable. No pancreatic ductal dilatation or surrounding inflammatory changes. Spleen: Normal in size without focal abnormality. Adrenals/Urinary Tract: Adrenal glands are unremarkable. Kidneys are normal, without renal calculi, focal lesion, or hydronephrosis. Bladder is unremarkable. Stomach/Bowel: Stomach is within normal limits. Similar appearing small bowel surgical anastomosis in the anterior lower abdomen without complicating features. Unchanged colo-colonic anastomosis without complicating features at the rectosigmoid junction. No evidence of bowel wall thickening, distention, or inflammatory changes. There is focal hyperenhancement on venous phase only within the  lumen of the  distal rectum. Lymphatic: No abdominopelvic lymphadenopathy. Reproductive: Uterus and bilateral adnexa are unremarkable. Other: Unchanged supraumbilical hernia containing transverse colon and small bowel without evidence of strangulation or obstruction. Musculoskeletal: No acute osseous abnormality. Similar appearing multilevel degenerative changes of the visualized thoracolumbar spine. IMPRESSION: 1. Venous phase intraluminal extravasation about the distal rectum suggestive of internal hemorrhoidal hemorrhage. No additional evidence of gastrointestinal hemorrhage. 2. Cholelithiasis. 3.  Aortic Atherosclerosis (ICD10-I70.0). Marliss Coots, MD [facility] Electronically Signed: By: Marliss Coots M.D. On: 08/02/2023 14:12     Assessment and plan- Patient is a 73 y.o. adult with history of stage I right upper lobe lung cancer s/p SBRT in 2022 now with biopsy-proven mediastinal recurrence consistent with adenocarcinoma.  She is here for on treatment assessment prior to cycle 7 of weekly CarboTaxol chemotherapy  Patient's white cell count is 2.3 today with an ANC of 1.6.  Okay to proceed with cycle 7 of weekly CarboTaxol chemotherapy today.  She will receive Zarzio for 3 days starting Monday next week.  Radiation does not get done until 2 weeks from now and therefore I will  give her 1 more dose of weekly CarboTaxol chemotherapy next week.  Plan to repeat CT chest abdomen and pelvis with contrast tentatively in 4 weeks time and I will see her a week or 2 after scans.  Her liquid biopsy does show evidence of EGFR mutation and therefore there would be a role for adjuvant osimertinib following completion of concurrent chemoradiation which I will discuss with her in greater detail when I see her after CT scans   Visit Diagnosis 1. Cancer of upper lobe of right lung (HCC)   2. Encounter for antineoplastic chemotherapy   3. Chemotherapy induced neutropenia  (HCC)      Dr. Owens Shark, MD, MPH [facility] [phone] 08/19/2023 11:07 AM

## 2023-08-19 NOTE — Patient Instructions (Signed)

## 2023-08-22 ENCOUNTER — Ambulatory Visit
Admission: RE | Admit: 2023-08-22 | Discharge: 2023-08-22 | Disposition: A | Discharge status: Home / Self Care | Payer: PPO | Source: Ambulatory Visit | Attending: Radiation Oncology | Admitting: Radiation Oncology

## 2023-08-22 ENCOUNTER — Encounter: Payer: Self-pay | Admitting: Oncology

## 2023-08-22 ENCOUNTER — Other Ambulatory Visit: Payer: Self-pay

## 2023-08-22 ENCOUNTER — Encounter: Payer: Self-pay | Admitting: Internal Medicine

## 2023-08-22 ENCOUNTER — Ambulatory Visit: Payer: PPO

## 2023-08-22 DIAGNOSIS — Z7901 Long term (current) use of anticoagulants: Secondary | ICD-10-CM | POA: Diagnosis not present

## 2023-08-22 DIAGNOSIS — C3411 Malignant neoplasm of upper lobe, right bronchus or lung: Secondary | ICD-10-CM | POA: Diagnosis not present

## 2023-08-22 DIAGNOSIS — Z510 Encounter for antineoplastic radiation therapy: Secondary | ICD-10-CM | POA: Diagnosis not present

## 2023-08-22 DIAGNOSIS — Z86711 Personal history of pulmonary embolism: Secondary | ICD-10-CM | POA: Diagnosis not present

## 2023-08-22 LAB — RAD ONC ARIA SESSION SUMMARY
Course Elapsed Days: 55
Plan Fractions Treated to Date: 26
Plan Prescribed Dose Per Fraction: 2 Gy
Plan Total Fractions Prescribed: 35
Plan Total Prescribed Dose: 70 Gy
Reference Point Dosage Given to Date: 52 Gy
Reference Point Session Dosage Given: 2 Gy
Session Number: 26

## 2023-08-23 ENCOUNTER — Ambulatory Visit: Payer: PPO

## 2023-08-23 ENCOUNTER — Ambulatory Visit
Admission: RE | Admit: 2023-08-23 | Discharge: 2023-08-23 | Disposition: A | Discharge status: Home / Self Care | Source: Ambulatory Visit | Attending: Radiation Oncology | Admitting: Radiation Oncology

## 2023-08-23 ENCOUNTER — Other Ambulatory Visit: Payer: Self-pay

## 2023-08-23 DIAGNOSIS — Z7722 Contact with and (suspected) exposure to environmental tobacco smoke (acute) (chronic): Secondary | ICD-10-CM | POA: Diagnosis not present

## 2023-08-23 DIAGNOSIS — C3411 Malignant neoplasm of upper lobe, right bronchus or lung: Secondary | ICD-10-CM | POA: Insufficient documentation

## 2023-08-23 DIAGNOSIS — Z510 Encounter for antineoplastic radiation therapy: Secondary | ICD-10-CM | POA: Insufficient documentation

## 2023-08-23 DIAGNOSIS — R110 Nausea: Secondary | ICD-10-CM | POA: Diagnosis not present

## 2023-08-23 DIAGNOSIS — Z79899 Other long term (current) drug therapy: Secondary | ICD-10-CM | POA: Diagnosis not present

## 2023-08-23 LAB — RAD ONC ARIA SESSION SUMMARY
Course Elapsed Days: 56
Plan Fractions Treated to Date: 27
Plan Prescribed Dose Per Fraction: 2 Gy
Plan Total Fractions Prescribed: 35
Plan Total Prescribed Dose: 70 Gy
Reference Point Dosage Given to Date: 54 Gy
Reference Point Session Dosage Given: 2 Gy
Session Number: 27

## 2023-08-24 ENCOUNTER — Ambulatory Visit

## 2023-08-25 ENCOUNTER — Ambulatory Visit

## 2023-08-26 ENCOUNTER — Ambulatory Visit

## 2023-08-26 ENCOUNTER — Encounter

## 2023-08-27 ENCOUNTER — Other Ambulatory Visit: Payer: Self-pay

## 2023-08-27 ENCOUNTER — Emergency Department
Admission: EM | Admit: 2023-08-27 | Discharge: 2023-08-27 | Disposition: A | Discharge status: Home / Self Care | Attending: Emergency Medicine | Admitting: Emergency Medicine

## 2023-08-27 ENCOUNTER — Emergency Department

## 2023-08-27 DIAGNOSIS — C3490 Malignant neoplasm of unspecified part of unspecified bronchus or lung: Secondary | ICD-10-CM | POA: Diagnosis not present

## 2023-08-27 DIAGNOSIS — R0689 Other abnormalities of breathing: Secondary | ICD-10-CM | POA: Diagnosis not present

## 2023-08-27 DIAGNOSIS — Z85118 Personal history of other malignant neoplasm of bronchus and lung: Secondary | ICD-10-CM | POA: Diagnosis not present

## 2023-08-27 DIAGNOSIS — I959 Hypotension, unspecified: Secondary | ICD-10-CM | POA: Diagnosis not present

## 2023-08-27 DIAGNOSIS — I509 Heart failure, unspecified: Secondary | ICD-10-CM | POA: Insufficient documentation

## 2023-08-27 DIAGNOSIS — J90 Pleural effusion, not elsewhere classified: Secondary | ICD-10-CM | POA: Diagnosis not present

## 2023-08-27 DIAGNOSIS — D649 Anemia, unspecified: Secondary | ICD-10-CM | POA: Insufficient documentation

## 2023-08-27 DIAGNOSIS — I110 Hypertensive heart disease with heart failure: Secondary | ICD-10-CM | POA: Insufficient documentation

## 2023-08-27 DIAGNOSIS — R531 Weakness: Secondary | ICD-10-CM | POA: Insufficient documentation

## 2023-08-27 DIAGNOSIS — A419 Sepsis, unspecified organism: Secondary | ICD-10-CM | POA: Diagnosis not present

## 2023-08-27 DIAGNOSIS — E119 Type 2 diabetes mellitus without complications: Secondary | ICD-10-CM | POA: Insufficient documentation

## 2023-08-27 DIAGNOSIS — N390 Urinary tract infection, site not specified: Secondary | ICD-10-CM

## 2023-08-27 DIAGNOSIS — J9811 Atelectasis: Secondary | ICD-10-CM | POA: Diagnosis not present

## 2023-08-27 DIAGNOSIS — R000 Tachycardia, unspecified: Secondary | ICD-10-CM | POA: Diagnosis not present

## 2023-08-27 DIAGNOSIS — D72819 Decreased white blood cell count, unspecified: Secondary | ICD-10-CM | POA: Insufficient documentation

## 2023-08-27 LAB — URINALYSIS, W/ REFLEX TO CULTURE (INFECTION SUSPECTED)
Bilirubin Urine: NEGATIVE
Glucose, UA: 150 mg/dL — AB
Ketones, ur: NEGATIVE mg/dL
Nitrite: NEGATIVE
Protein, ur: 30 mg/dL — AB
Specific Gravity, Urine: 1.02 (ref 1.005–1.030)
WBC, UA: >50 WBC/hpf (ref 0–5)
pH: 5 (ref 5.0–8.0)

## 2023-08-27 LAB — CBC WITH DIFFERENTIAL/PLATELET
Abs Immature Granulocytes: 0.05 10*3/uL (ref 0.00–0.07)
Basophils Absolute: 0 10*3/uL (ref 0.0–0.1)
Basophils Relative: 1 %
Eosinophils Absolute: 0 10*3/uL (ref 0.0–0.5)
Eosinophils Relative: 1 %
HCT: 28.4 % — ABNORMAL LOW (ref 36.0–46.0)
Hemoglobin: 9 g/dL — ABNORMAL LOW (ref 12.0–15.0)
Immature Granulocytes: 2 %
Lymphocytes Relative: 20 %
Lymphs Abs: 0.4 10*3/uL — ABNORMAL LOW (ref 0.7–4.0)
MCH: 30.3 pg (ref 26.0–34.0)
MCHC: 31.7 g/dL (ref 30.0–36.0)
MCV: 95.6 fL (ref 80.0–100.0)
Monocytes Absolute: 0.4 10*3/uL (ref 0.1–1.0)
Monocytes Relative: 18 %
Neutro Abs: 1.3 10*3/uL — ABNORMAL LOW (ref 1.7–7.7)
Neutrophils Relative %: 58 %
Platelets: 174 10*3/uL (ref 150–400)
RBC: 2.97 MIL/uL — ABNORMAL LOW (ref 3.87–5.11)
RDW: 22.5 % — ABNORMAL HIGH (ref 11.5–15.5)
Smear Review: NORMAL
WBC: 2.2 10*3/uL — ABNORMAL LOW (ref 4.0–10.5)
nRBC: 1.4 % — ABNORMAL HIGH (ref 0.0–0.2)

## 2023-08-27 LAB — RESP PANEL BY RT-PCR (RSV, FLU A&B, COVID)  RVPGX2
Influenza A by PCR: NEGATIVE
Influenza B by PCR: NEGATIVE
Resp Syncytial Virus by PCR: NEGATIVE
SARS Coronavirus 2 by RT PCR: NEGATIVE

## 2023-08-27 LAB — LACTIC ACID, PLASMA
Lactic Acid, Venous: 2.7 mmol/L (ref 0.5–1.9)
Lactic Acid, Venous: 1.3 mmol/L (ref 0.5–1.9)

## 2023-08-27 LAB — COMPREHENSIVE METABOLIC PANEL WITH GFR
ALT: 21 U/L (ref 0–44)
AST: 24 U/L (ref 15–41)
Albumin: 2.9 g/dL — ABNORMAL LOW (ref 3.5–5.0)
Alkaline Phosphatase: 37 U/L — ABNORMAL LOW (ref 38–126)
Anion gap: 8 (ref 5–15)
BUN: 9 mg/dL (ref 8–23)
CO2: 21 mmol/L — ABNORMAL LOW (ref 22–32)
Calcium: 7.9 mg/dL — ABNORMAL LOW (ref 8.9–10.3)
Chloride: 109 mmol/L (ref 98–111)
Creatinine, Ser: 0.67 mg/dL (ref 0.44–1.00)
GFR, Estimated: >60 mL/min (ref >60)
Glucose, Bld: 256 mg/dL — ABNORMAL HIGH (ref 70–99)
Potassium: 3.5 mmol/L (ref 3.5–5.1)
Sodium: 138 mmol/L (ref 135–145)
Total Bilirubin: 0.6 mg/dL (ref 0.0–1.2)
Total Protein: 5.7 g/dL — ABNORMAL LOW (ref 6.5–8.1)

## 2023-08-27 LAB — PROTIME-INR
INR: 1.9 — ABNORMAL HIGH (ref 0.8–1.2)
Prothrombin Time: 22 s — ABNORMAL HIGH (ref 11.4–15.2)

## 2023-08-27 LAB — TROPONIN I (HIGH SENSITIVITY): Troponin I (High Sensitivity): 5 ng/L (ref <18)

## 2023-08-27 MED ORDER — KETOROLAC TROMETHAMINE 30 MG/ML IJ SOLN
15.0000 mg | Freq: Once | INTRAMUSCULAR | Status: AC
  Administered 2023-08-27: 15 mg via INTRAVENOUS
  Filled 2023-08-27: qty 1

## 2023-08-27 MED ORDER — CEPHALEXIN 500 MG PO CAPS: 500.0000 mg | ORAL_CAPSULE | Freq: Two times a day (BID) | ORAL | 0 refills | Status: AC

## 2023-08-27 MED ORDER — CALCIUM GLUCONATE-NACL 1-0.675 GM/50ML-% IV SOLN
1.0000 g | Freq: Once | INTRAVENOUS | Status: AC
  Administered 2023-08-27: 1000 mg via INTRAVENOUS
  Filled 2023-08-27: qty 50

## 2023-08-27 MED ORDER — SODIUM CHLORIDE 0.9 % IV SOLN
2.0000 g | Freq: Once | INTRAVENOUS | Status: AC
  Administered 2023-08-27: 2 g via INTRAVENOUS
  Filled 2023-08-27: qty 20

## 2023-08-27 MED ORDER — ONDANSETRON HCL 4 MG/2ML IJ SOLN
4.0000 mg | Freq: Once | INTRAMUSCULAR | Status: AC
  Administered 2023-08-27: 4 mg via INTRAVENOUS
  Filled 2023-08-27: qty 2

## 2023-08-27 MED ORDER — SODIUM CHLORIDE 0.9 % IV BOLUS
1000.0000 mL | Freq: Once | INTRAVENOUS | Status: AC
  Administered 2023-08-27: 1000 mL via INTRAVENOUS

## 2023-08-27 NOTE — ED Triage Notes (Signed)
 First Nurse Note:  Pt via [facility] from home. Pt c/o generalized weakness for the past 2 days, report hx of CA and on chemotherapy treatment. EMS reports initial BP 80s but EMS started 20 G and started IV. Reports HR of 115. Denies any other complaints. Pt is A&Ox4 and NAD

## 2023-08-27 NOTE — ED Provider Notes (Signed)
 Care assumed of patient from outgoing provider.  See their note for initial history, exam and plan.  Clinical Course as of 08/27/23 1808  Sat Aug 27, 2023  1503 Lung CA follow by Dr. Smith Robert on chemo and radiation - fatigue and decreased PO intake.  IVF.  RVP pending and UA pending. Given symptomatic treatment.  [SM]    Clinical Course User Index [SM] Corena Herter, MD   COVID and influenza testing are negative.  Lactic acid downtrending and now normal at 1.3.  Serial troponins are negative.  Patient asking to eat, tolerating p.o. and eating food in the emergency department.  Discussed with the nurse getting a urine know.  Chest x-ray with no acute findings.  UA with findings concerning for urinary tract infection.  On chart review no history of resistant urinary tract infection.  Lactic acid is normal.  Tolerating p.o.  Patient able to ambulate without any difficulties.  No nausea or vomiting and eating a sandwich in the emergency department.  States that she feels better and wants to go home.  Given IV Rocephin in the emergency department will start on Keflex.  Discussed close follow-up as an outpatient with her primary care physician and oncologist.  Discussed return to the emergency department for any worsening symptoms.  No questions at time of discharge.   Corena Herter, MD 08/27/23 [phone]

## 2023-08-27 NOTE — ED Provider Notes (Signed)
 [facility] Provider Note    Event Date/Time   First MD Initiated Contact with Patient 08/27/23 1309     (approximate)  History   Chief Complaint: Weakness  HPI  Kara [name] is a 73 y.o. adult with a past medical history of CHF, diabetes, hypertension, lung cancer currently on chemotherapy and radiation follows up with Dr. Smith Robert, presents to the emergency department for generalized weakness.  According to the patient for the past few days she has been feeling very weak, denies any focal weakness denies any chest pain abdominal pain cough or congestion.  Did state 1 episode of loose stool 2 days ago but no further diarrhea.  No nausea or vomiting.  No fever at any point.  Just generalized fatigue and weakness.  Physical Exam   Triage Vital Signs: ED Triage Vitals  Encounter Vitals Group     BP 08/27/23 1232 (!) 88/59     Systolic BP Percentile --      Diastolic BP Percentile --      Pulse Rate 08/27/23 1232 (!) 112     Resp 08/27/23 1232 (!) 22     Temp 08/27/23 1232 98 F (36.7 C)     Temp Source 08/27/23 1314 Oral     SpO2 08/27/23 1232 96 %     Weight 08/27/23 1230 192 lb (87.1 kg)     Height 08/27/23 1230 5\' 3"  (1.6 m)     Head Circumference --      Peak Flow --      Pain Score 08/27/23 1243 0     Pain Loc --      Pain Education --      Exclude from Growth Chart --     Most recent vital signs: Vitals:   08/27/23 1232 08/27/23 1314  BP: (!) 88/59 98/86  Pulse: (!) 112 (!) 106  Resp: (!) 22 (!) 100  Temp: 98 F (36.7 C) 98.3 F (36.8 C)  SpO2: 96% 99%    General: Awake, no distress.  CV:  Good peripheral perfusion.  Regular rate and rhythm  Resp:  Normal effort.  Equal breath sounds bilaterally.  Abd:  No distention.  Soft, nontender.  No rebound or guarding.  ED Results / Procedures / Treatments   EKG  EKG viewed and interpreted by myself shows sinus tachycardia 111 bpm the narrow QRS, normal axis, normal intervals, no  concerning ST changes.  RADIOLOGY  I have reviewed interpret the chest x-ray images.  No obvious consolidation on my evaluation.  Patient has a port present to the right chest.  Does appear to have some opacity to the right chest possibly atelectasis versus radiation-induced scar tissue.  Awaiting radiology read.   MEDICATIONS ORDERED IN ED: Medications  sodium chloride 0.9 % bolus 1,000 mL (has no administration in time range)  ketorolac (TORADOL) 30 MG/ML injection 15 mg (has no administration in time range)  ondansetron (ZOFRAN) injection 4 mg (has no administration in time range)     IMPRESSION / MDM / ASSESSMENT AND PLAN / ED COURSE  I reviewed the triage vital signs and the nursing notes.  Patient's presentation is most consistent with acute presentation with potential threat to life or bodily function.  Patient presents to the emergency department for generalized weakness.  Overall patient appears well she is lying in bed states she feels very weak.  Patient's lab work has resulted showing reassuring CBC patient has leukopenia and anemia however this is largely unchanged  from her baseline labs.  Lactic acid is mildly elevated at 2.7 we will IV hydrate.  Patient's chemistry shows no significant finding.  Reassuring kidney function.  Calcium is slightly low at 7.9, we will dose calcium gluconate.  Will obtain a troponin as a precaution in addition to chest x-ray and urinalysis.  Will discuss with oncology if the patient continues to feel significantly weak.  Patient workup is pending patient care signed out to oncoming provider.  FINAL CLINICAL IMPRESSION(S) / ED DIAGNOSES   Weakness  Note:  This document was prepared using Dragon voice recognition software and may include unintentional dictation errors.   Minna Antis, MD 08/28/23 [phone]

## 2023-08-27 NOTE — ED Triage Notes (Signed)
 Reports feeling weak for a few days.  Reports she thinks its due to her chemo and radiation.  Reports mild cough.  Denies sick contact. Denies urinary symptoms.

## 2023-08-27 NOTE — Discharge Instructions (Signed)
 You are seen in the emergency department for generalized weakness.  You are diagnosed with a urinary tract infection.  You received IV fluids in the emergency department and your lab work improved.  You are given a dose of IV antibiotics in the emergency department and your urine was sent for culture.  Call and follow-up closely with your oncologist on Monday.  Stay hydrated and drink plenty of fluids.  Take your antibiotics as prescribed.  Return to the emergency department for any worsening symptoms.

## 2023-08-28 NOTE — Progress Notes (Unsigned)
 PROVIDER NOTE: Interpretation of information contained herein should be left to medically-trained personnel. Specific patient instructions are provided elsewhere under "Patient Instructions" section of medical record. This document was created in part using AI and STT-dictation technology, any transcriptional errors that may result from this process are unintentional.  Patient: Kara [name]  Service: E/M   PCP: Barbette Reichmann, MD  DOB: [birth year]  DOS: 08/29/2023  Provider: Oswaldo Done, MD  MRN: [id number]  Delivery: Face-to-face  Specialty: Interventional Pain Management  Type: Established Patient  Setting: Ambulatory outpatient facility  Specialty designation: 09  Referring Prov.: Barbette Reichmann, MD  Location: Outpatient office facility       HPI  Kara [name], a 73 y.o. year old adult, is here today because of her Chronic bilateral low back pain without sciatica [M54.50, G89.29]. Kara [name] primary complain today is No chief complaint on file.  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); Lumbar pars defect (L3 and L4) (Bilateral); Diabetic peripheral neuropathy (HCC); Chronic lower extremity pain (2ry area of Pain) (Bilateral); Chronic radicular pain of lower extremity; Osteoarthritis of hip (Right); Chronic low back pain (1ry area of Pain) (Bilateral) w/o sciatica; Spondylosis without myelopathy or  radiculopathy, lumbosacral region; Other specified dorsopathies, sacral and sacrococcygeal region; Secondary osteoarthritis of multiple sites; Sacral insufficiency fracture; DDD (degenerative disc disease), lumbosacral; Coccygodynia; Chronic sacroiliac joint pain (Left); Osteoarthritis of lumbar spine; Spondylolisthesis, lumbosacral region; Abnormal MRI, lumbar spine (08/09/2019); Spasm of muscle of lower back; Chronic hip pain (Bilateral) (R>L); Osteoarthritis of hips (Bilateral); Greater trochanteric bursitis (Right); Chronic hip pain (Right); Chronic low back pain (Right) w/o sciatica; Greater trochanteric bursitis (Left); Other bursitis of hip, not elsewhere classified (gluteus medius bursa) (Left); Lumbar facet joint pain; Piriformis muscle pain (Right); Abdominal pain; Chest pain in adult; Chest pain; Mediastinal adenopathy; Cancer of upper lobe of right lung (HCC); and Recurrent non-small cell lung cancer (NSCLC) (HCC) on their pertinent problem list. Pain Assessment: Severity of   is reported as a  /10. Location:    / . Onset:  . Quality:  . Timing:  . Modifying factor(s):  Marland Kitchen Vitals:  vitals were not taken for this visit.  BMI: Estimated body mass index is 34.01 kg/m as calculated from the following:   Height as of 08/27/23: 5\' 3"  (1.6 m).   Weight as of 08/27/23: 192 lb (87.1 kg). Last encounter: 07/07/2023. Last procedure: 06/21/2023.  Reason for encounter: evaluation of worsening, or previously known (established) problem. ***  Discussed the use of AI scribe software for clinical note transcription with the patient, who gave verbal consent to proceed.  History of Present Illness           Pharmacotherapy Assessment  Analgesic: No chronic opioid analgesics therapy prescribed by our practice, 2ry to Medication Agreement Violation. Oxycodone IR 10 mg, 1 tab PO qd,  from PCP.  (Getting oxycodone from PCP in multiple occasions, according to PMP)(11/21/20; 12/26/20; 01/16/21; 02/13/21). Opioid analgesic  pharmacotherapy D/C'ed on 03/03/2021. MME: 20 mg/day.   Monitoring: [place] PMP: PDMP reviewed during this encounter.  Pharmacotherapy: No side-effects or adverse reactions reported. Compliance: No problems identified. Effectiveness: Clinically acceptable.  No notes on file  No results found for: "CBDTHCR" No results found for: "D8THCCBX" No results found for: "D9THCCBX"  UDS:  Summary  Date Value Ref Range Status  11/12/2020 Note  Final    Comment:    ==================================================================== [employer] Select 13 (MW) ==================================================================== Test                             Result       Flag       Units  Drug Present and Declared for Prescription Verification   Norhydrocodone                 61           EXPECTED   ng/mg creat    Norhydrocodone is an expected metabolite of hydrocodone.    Tramadol                       >4202        EXPECTED   ng/mg creat   O-Desmethyltramadol            >4202        EXPECTED   ng/mg creat   N-Desmethyltramadol            1712         EXPECTED   ng/mg creat    Source of tramadol is a prescription medication. O-desmethyltramadol    and N-desmethyltramadol are expected metabolites of tramadol.  Drug Absent but Declared for Prescription Verification   Hydrocodone                    Not Detected UNEXPECTED ng/mg creat    Hydrocodone is almost always present in patients taking this drug    consistently. Absence of hydrocodone could be due to lapse of time    since the last dose or unusual pharmacokinetics (rapid metabolism).  ==================================================================== Test                      Result    Flag   Units      Ref Range   Creatinine              119              mg/dL      >=13 ==================================================================== Declared Medications:  The flagging and interpretation on this report are based on the   following declared medications.  Unexpected results may arise from  inaccuracies in the declared medications.   **Note: The testing scope of this panel includes these medications:   Hydrocodone  Tramadol   **Note: The testing scope of this panel does not include the  following reported medications:   Acetaminophen  Alendronate (Fosamax)  Aspirin  Calcium  Candesartan  Cholecalciferol  Cyanocobalamin  Cyclobenzaprine  Dulaglutide  Gabapentin  Glipizide  Meclizine  Multivitamin  Nystatin  Ondansetron  Ropinirole  Tolterodine  Warfarin ==================================================================== For clinical consultation, please call [phone]. ====================================================================       ROS  Constitutional: Denies any fever or chills Gastrointestinal: No reported hemesis, hematochezia, vomiting, or acute GI distress Musculoskeletal: Denies any acute onset joint swelling, redness, loss of ROM, or weakness Neurological: No reported episodes of acute onset apraxia, aphasia, dysarthria, agnosia, amnesia, paralysis, loss of coordination, or loss of consciousness  Medication Review  Dulaglutide,  HYDROcodone-acetaminophen, Magnesium Oxide -Mg Supplement, Vitamin B-12, azithromycin, bisacodyl, candesartan, cephALEXin, cholecalciferol, cyclobenzaprine, dexamethasone, gabapentin, glipiZIDE, glucose blood, ondansetron, oxybutynin, potassium chloride SA, prochlorperazine, rOPINIRole, sucralfate, and warfarin  History Review  Allergy: Kara [name] is allergic to adhesive [tape], avapro [irbesartan], morphine and codeine, other, and latex. Drug: Kara [name]  reports no history of drug use. Alcohol:  reports no history of alcohol use. Tobacco:  reports that she has never smoked. She has been exposed to tobacco smoke. She has never used smokeless tobacco. Social: Kara [name]  reports that she has never smoked. She has been exposed to  tobacco smoke. She has never used smokeless tobacco. She reports that she does not drink alcohol and does not use drugs. Medical:  has a past medical history of Anginal pain (HCC), Arthritis, CHF (congestive heart failure) (HCC), Chronic anticoagulation, Degenerative disc disease, lumbar, Diverticulitis (2018), Diverticulosis, DVT of lower extremity, bilateral (HCC) (2018), Essential hypertension, History of being hospitalized, History of hiatal hernia, Mediastinal adenopathy (04/2023), MRSA nasal colonization, Non-small cell lung cancer (HCC) (2022), Obesity (BMI 30-39.9), Osteoarthritis, Osteoporosis, PE (pulmonary thromboembolism) (HCC) (2018), Pneumonia, Small bowel obstruction (HCC) (03/20/2023), 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HCC), Type 2 diabetes mellitus with obesity (HCC), and Vertigo. Surgical: Kara [name]  has a past surgical history that includes Knee surgery (Right); Carpal tunnel release (Bilateral); Colon resection sigmoid (N/A, 11/02/2016); Colostomy (N/A, 11/02/2016); Sigmoidoscopy (N/A, 11/13/2016); PULMONARY VENOGRAPHY (N/A, 11/19/2016); Colonoscopy with propofol (N/A, 02/03/2017); Colon surgery (11/02/2016); IVC FILTER INSERTION (Right, 10/2016); Colostomy reversal (N/A, 02/24/2017); Appendectomy (N/A, 02/24/2017); Lysis of adhesion (N/A, 02/24/2017); laparoscopy (N/A, 02/24/2017); Ileo loop diversion (N/A, 02/24/2017); Sigmoidoscopy (N/A, 05/19/2017); Ileostomy closure (N/A, 06/01/2017); IVC FILTER REMOVAL (N/A, 08/09/2017); Sacroplasty (N/A, 11/08/2017); Cataract extraction w/PHACO (Left, 11/21/2018); Cataract extraction w/PHACO (Right, 12/12/2018); Video bronchoscopy with endobronchial navigation (N/A, 02/23/2021); Flexible bronchoscopy (N/A, 05/16/2023); Endobronchial ultrasound (N/A, 05/16/2023); and IR IMAGING GUIDED PORT INSERTION (06/14/2023). Family: family history includes Breast cancer in her mother; Cancer in her mother; Diabetes in her mother and sister; Heart disease in her  father, mother, and sister.  Laboratory Chemistry Profile   Renal Lab Results  Component Value Date   BUN 9 08/27/2023   CREATININE 0.67 08/27/2023   GFRAA >60 07/09/2019   GFRNONAA >60 08/27/2023    Hepatic Lab Results  Component Value Date   AST 24 08/27/2023   ALT 21 08/27/2023   ALBUMIN 2.9 (L) 08/27/2023   ALKPHOS 37 (L) 08/27/2023   LIPASE 28 07/06/2023    Electrolytes Lab Results  Component Value Date   NA 138 08/27/2023   K 3.5 08/27/2023   CL 109 08/27/2023   CALCIUM 7.9 (L) 08/27/2023   MG 1.9 07/12/2023   PHOS 4.0 03/22/2023    Bone Lab Results  Component Value Date   25OHVITD1 25 (L) 11/02/2017   25OHVITD2 <1.0 11/02/2017   25OHVITD3 25 11/02/2017    Inflammation (CRP: Acute Phase) (ESR: Chronic Phase) Lab Results  Component Value Date   CRP 1.7 (H) 08/15/2020   ESRSEDRATE 12 11/02/2017   LATICACIDVEN 1.3 08/27/2023         Note: Above Lab results reviewed.  Recent Imaging Review  DG Chest 2 View CLINICAL DATA:  Sepsis.  Non-small-cell lung cancer.  EXAM: CHEST - 2 VIEW  COMPARISON:  05/16/2023  FINDINGS: Similar appearance of bandlike opacity in the right mid lung characterized as hypermetabolic on PET-CT 06/03/2023. There is some overlying pleural thickening, stable in the interval. Right base atelectasis with tiny right pleural effusion similar to prior. Left lung  remains clear. Cardiopericardial silhouette is at upper limits of normal for size. Right Port-A-Cath is new in the interval with tip overlying the mid SVC level.  IMPRESSION: 1. Similar appearance of bandlike opacity in the right mid lung characterized as hypermetabolic on PET-CT 06/03/2023 and compatible with the patient's known lung lesion. 2. Right base atelectasis with tiny right pleural effusion, similar to prior.  Electronically Signed   By: Kennith Center M.D.   On: 08/27/2023 13:13 Note: Reviewed        Physical Exam  General appearance: Well nourished, well  developed, and well hydrated. In no apparent acute distress Mental status: Alert, oriented x 3 (person, place, & time)       Respiratory: No evidence of acute respiratory distress Eyes: PERLA Vitals: There were no vitals taken for this visit. BMI: Estimated body mass index is 34.01 kg/m as calculated from the following:   Height as of 08/27/23: 5\' 3"  (1.6 m).   Weight as of 08/27/23: 192 lb (87.1 kg). Ideal: Ideal body weight: 52.4 kg (115 lb 8.3 oz) Adjusted ideal body weight: 66.3 kg (146 lb 1.8 oz)  Assessment   Diagnosis Status  1. Chronic low back pain (1ry area of Pain) (Bilateral) w/o sciatica   2. Chronic lower extremity pain (2ry area of Pain) (Bilateral)   3. Abnormal MRI, lumbar spine (08/09/2019)   4. Grade 1-2 Anterolisthesis of L4/L5 & L5/S1 (stable as of 11/05/2021)   5. Lumbar facet arthropathy (L3-4, L4-5, and L5-S1) (Bilateral)   6. Lumbar facet joint pain   7. Lumbar facet syndrome (Bilateral)   8. 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 Lumbosacral L5-S1 subarticular lateral recess stenosis (Bilateral)   10. Lumbar foraminal stenosis (L3-4 and L4-5) (Bilateral)   11. Spondylosis without myelopathy or radiculopathy, lumbosacral region   12. Chronic anticoagulation (COUMADIN)    Controlled Controlled Controlled   Updated Problems: No problems updated.  Plan of Care  Problem-specific:  Assessment and Plan            Kara [name] has a current medication list which includes the following long-term medication(s): candesartan, glipizide, potassium chloride sa, prochlorperazine, ropinirole, and sucralfate.  Pharmacotherapy (Medications Ordered): No orders of the defined types were placed in this encounter.  Orders:  No orders of the defined types were placed in this encounter.  Follow-up plan:   No follow-ups on file.     Interventional Therapies  Risk  Complexity Considerations:   ANTICOAGULATION: Coumadin (Stop: 5 days   Re-start: 2hrs)  Hx DVT & PE ALLERGY: Latex Morbid obesity (class III) (BMI>40)  metabolic syndrome  NIDDM  HTN Difficult Lumbar RFA due to morbid obesity    Planned  Pending: Palliative bilateral lumbar facet MBB [id number]    Under consideration:   Therapeutic bilateral lumbar facet RFA #2 (patient indicates preferring to have only the block without radiofrequency.)   Completed:   Therapeutic bilateral lumbar facet MBB [id number] (03/15/2023) (25/25/50/50)  Diagnostic/therapeutic right IA hip inj. x2 (01/28/2022) (100/100/85/85)  Diagnostic/therapeutic left IA hip inj. x1 (01/28/2022) (100/100/85/85)  Diagnostic/therapeutic right trochanteric bursa inj. x1 (09/15/2021) (85/85/85)  Diagnostic/therapeutic left trochanteric bursa inj. x1 (01/28/2022) (100/100/85/85)  Therapeutic left lumbar facet RFA x1 (07/21/2021) (100/100/100 x1 week/80) (Lasted 9 months)  Therapeutic right lumbar facet RFA x1 (07/07/2021) (100/100/80/80) (Lasted 9 months)  Palliative/Therapeutic left L4-5 LESI x1 (11/10/2017) (100/80/0/0-25)  Diagnostic/therapeutic right L4 TFESI x1 (11/19/2021) (90/80/0/0) (fell at [employer]) Palliative right SI joint block x9 (01/28/2022) (100/100/80/80)  Diagnostic left SI joint block x2 (01/28/2022) (100/100/80/80)  Palliative/Therapeutic (Midline) caudal ESI x3 (06/08/2018) (100/100/100 x 2-3 days/0)    Therapeutic  Palliative (PRN) options:   NO PRN procedures without F2F exam by Dr. Laban Emperor due to multifactorial etiology of chronic pain. No more procedures until after 08/14/2022.    Pharmacotherapy  Nonopioids transferred 03/27/2020: Calcium      Recent Visits Date Type Provider Dept  06/21/23 Procedure visit Delano Metz, MD [name]  06/13/23 Office Visit Delano Metz, MD [name]  Showing recent visits within past 90 days and meeting all other requirements Future Appointments Date Type Provider Dept  08/29/23 Appointment Delano Metz, MD [name]  Showing future appointments within next 90 days and meeting all other requirements  I discussed the assessment and treatment plan with the patient. The patient was provided an opportunity to ask questions and all were answered. The patient agreed with the plan and demonstrated an understanding of the instructions.  Patient advised to call back or seek an in-person evaluation if the symptoms or condition worsens.  Duration of encounter: *** minutes.  Total time on encounter, as per AMA guidelines included both the face-to-face and non-face-to-face time personally spent by the physician and/or other qualified health care professional(s) on the day of the encounter (includes time in activities that require the physician or other qualified health care professional and does not include time in activities normally performed by clinical staff). Physician's time may include the following activities when performed: Preparing to see the patient (e.g., pre-charting review of records, searching for previously ordered imaging, lab work, and nerve conduction tests) Review of prior analgesic pharmacotherapies. Reviewing PMP Interpreting ordered tests (e.g., lab work, imaging, nerve conduction tests) Performing post-procedure evaluations, including interpretation of diagnostic procedures Obtaining and/or reviewing separately obtained history Performing a medically appropriate examination and/or evaluation Counseling and educating the patient/family/caregiver Ordering medications, tests, or procedures Referring and communicating with other health care professionals (when not separately reported) Documenting clinical information in the electronic or other health record Independently interpreting results (not separately reported) and communicating results to the patient/ family/caregiver Care coordination (not separately reported)  Note by: Oswaldo Done, MD (TTS and AI  technology used. I apologize for any typographical errors that were not detected and corrected.) Date: 08/29/2023; Time: 12:10 PM

## 2023-08-28 NOTE — Patient Instructions (Signed)
 ______________________________________________________________________    Procedure instructions  Stop blood-thinners  Do not eat or drink fluids (other than water) for 6 hours before your procedure  No water for 2 hours before your procedure  Take your blood pressure medicine with a sip of water  Arrive 30 minutes before your appointment  If sedation is planned, bring suitable driver. Pennie Banter, Benedetto Goad, & public transportation are NOT APPROVED)  Carefully read the "Preparing for your procedure" detailed instructions  If you have questions call us at [phone]  Procedure appointments are for procedures only. NO medication refills or new problem evaluations.   ______________________________________________________________________      ______________________________________________________________________    Preparing for your procedure  Appointments: If you think you may not be able to keep your appointment, call 24-48 hours in advance to cancel. We need time to make it available to others.  Procedure visits are for procedures only. During your procedure appointment there will be: NO Prescription Refills*. NO medication changes or discussions*. NO discussion of disability issues*. NO unrelated pain problem evaluations*. NO evaluations to order other pain procedures*. *These will be addressed at a separate and distinct evaluation encounter on the provider's evaluation schedule and not during procedure days.  Instructions: Food intake: Avoid eating anything solid for at least 8 hours prior to your procedure. Clear liquid intake: You may take clear liquids such as water up to 2 hours prior to your procedure. (No carbonated drinks. No soda.) Transportation: Unless otherwise stated by your physician, bring a driver. (Driver cannot be a Market researcher, Pharmacist, community, or any other form of public transportation.) Morning Medicines: Except for blood thinners, take all of your other morning  medications with a sip of water. Make sure to take your heart and blood pressure medicines. If your blood pressure's lower number is above 100, the case will be rescheduled. Blood thinners: Make sure to stop your blood thinners as instructed.  If you take a blood thinner, but were not instructed to stop it, call our office [phone] and ask to talk to a nurse. Not stopping a blood thinner prior to certain procedures could lead to serious complications. Diabetics on insulin: Notify the staff so that you can be scheduled 1st case in the morning. If your diabetes requires high dose insulin, take only  of your normal insulin dose the morning of the procedure and notify the staff that you have done so. Preventing infections: Shower with an antibacterial soap the morning of your procedure.  Build-up your immune system: Take 1000 mg of Vitamin C with every meal (3 times a day) the day prior to your procedure. Antibiotics: Inform the nursing staff if you are taking any antibiotics or if you have any conditions that may require antibiotics prior to procedures. (Example: recent joint implants)   Pregnancy: If you are pregnant make sure to notify the nursing staff. Not doing so may result in injury to the fetus, including death.  Sickness: If you have a cold, fever, or any active infections, call and cancel or reschedule your procedure. Receiving steroids while having an infection may result in complications. Arrival: You must be in the facility at least 30 minutes prior to your scheduled procedure. Tardiness: Your scheduled time is also the cutoff time. If you do not arrive at least 15 minutes prior to your procedure, you will be rescheduled.  Children: Do not bring any children with you. Make arrangements to keep them home. Dress appropriately: There is always a possibility that your clothing may get  soiled. Avoid long dresses. Valuables: Do not bring any jewelry or valuables.  Reasons to call and  reschedule or cancel your procedure: (Following these recommendations will minimize the risk of a serious complication.) Surgeries: Avoid having procedures within 2 weeks of any surgery. (Avoid for 2 weeks before or after any surgery). Flu Shots: Avoid having procedures within 2 weeks of a flu shots or . (Avoid for 2 weeks before or after immunizations). Barium: Avoid having a procedure within 7-10 days after having had a radiological study involving the use of radiological contrast. (Myelograms, Barium swallow or enema study). Heart attacks: Avoid any elective procedures or surgeries for the initial 6 months after a "Myocardial Infarction" (Heart Attack). Blood thinners: It is imperative that you stop these medications before procedures. Let us know if you if you take any blood thinner.  Infection: Avoid procedures during or within two weeks of an infection (including chest colds or gastrointestinal problems). Symptoms associated with infections include: Localized redness, fever, chills, night sweats or profuse sweating, burning sensation when voiding, cough, congestion, stuffiness, runny nose, sore throat, diarrhea, nausea, vomiting, cold or Flu symptoms, recent or current infections. It is specially important if the infection is over the area that we intend to treat. Heart and lung problems: Symptoms that may suggest an active cardiopulmonary problem include: cough, chest pain, breathing difficulties or shortness of breath, dizziness, ankle swelling, uncontrolled high or unusually low blood pressure, and/or palpitations. If you are experiencing any of these symptoms, cancel your procedure and contact your primary care physician for an evaluation.  Remember:  Regular Business hours are:  Monday to Thursday 8:00 AM to 4:00 PM  Provider's Schedule: Delano Metz, MD:  Procedure days: Tuesday and Thursday 7:30 AM to 4:00 PM  Edward Jolly, MD:  Procedure days: Monday and Wednesday 7:30 AM to 4:00  PM Last  Updated: 05/03/2023 ______________________________________________________________________      ______________________________________________________________________    General Risks and Possible Complications  Patient Responsibilities: It is important that you read this as it is part of your informed consent. It is our duty to inform you of the risks and possible complications associated with treatments offered to you. It is your responsibility as a patient to read this and to ask questions about anything that is not clear or that you believe was not covered in this document.  Patient's Rights: You have the right to refuse treatment. You also have the right to change your mind, even after initially having agreed to have the treatment done. However, under this last option, if you wait until the last second to change your mind, you may be charged for the materials used up to that point.  Introduction: Medicine is not an Visual merchandiser. Everything in Medicine, including the lack of treatment(s), carries the potential for danger, harm, or loss (which is by definition: Risk). In Medicine, a complication is a secondary problem, condition, or disease that can aggravate an already existing one. All treatments carry the risk of possible complications. The fact that a side effects or complications occurs, does not imply that the treatment was conducted incorrectly. It must be clearly understood that these can happen even when everything is done following the highest safety standards.  No treatment: You can choose not to proceed with the proposed treatment alternative. The "PRO(s)" would include: avoiding the risk of complications associated with the therapy. The "CON(s)" would include: not getting any of the treatment benefits. These benefits fall under one of three categories: diagnostic; therapeutic; and/or  palliative. Diagnostic benefits include: getting information which can ultimately lead to  improvement of the disease or symptom(s). Therapeutic benefits are those associated with the successful treatment of the disease. Finally, palliative benefits are those related to the decrease of the primary symptoms, without necessarily curing the condition (example: decreasing the pain from a flare-up of a chronic condition, such as incurable terminal cancer).  General Risks and Complications: These are associated to most interventional treatments. They can occur alone, or in combination. They fall under one of the following six (6) categories: no benefit or worsening of symptoms; bleeding; infection; nerve damage; allergic reactions; and/or death. No benefits or worsening of symptoms: In Medicine there are no guarantees, only probabilities. No healthcare provider can ever guarantee that a medical treatment will work, they can only state the probability that it may. Furthermore, there is always the possibility that the condition may worsen, either directly, or indirectly, as a consequence of the treatment. Bleeding: This is more common if the patient is taking a blood thinner, either prescription or over the counter (example: Goody Powders, Fish oil, Aspirin, Garlic, etc.), or if suffering a condition associated with impaired coagulation (example: Hemophilia, cirrhosis of the liver, low platelet counts, etc.). However, even if you do not have one on these, it can still happen. If you have any of these conditions, or take one of these drugs, make sure to notify your treating physician. Infection: This is more common in patients with a compromised immune system, either due to disease (example: diabetes, cancer, human immunodeficiency virus [HIV], etc.), or due to medications or treatments (example: therapies used to treat cancer and rheumatological diseases). However, even if you do not have one on these, it can still happen. If you have any of these conditions, or take one of these drugs, make sure to notify  your treating physician. Nerve Damage: This is more common when the treatment is an invasive one, but it can also happen with the use of medications, such as those used in the treatment of cancer. The damage can occur to small secondary nerves, or to large primary ones, such as those in the spinal cord and brain. This damage may be temporary or permanent and it may lead to impairments that can range from temporary numbness to permanent paralysis and/or brain death. Allergic Reactions: Any time a substance or material comes in contact with our body, there is the possibility of an allergic reaction. These can range from a mild skin rash (contact dermatitis) to a severe systemic reaction (anaphylactic reaction), which can result in death. Death: In general, any medical intervention can result in death, most of the time due to an unforeseen complication. ______________________________________________________________________      ______________________________________________________________________    Blood Thinners  IMPORTANT NOTICE:  If you take any of these, make sure to notify the nursing staff.  Failure to do so may result in serious injury.  Recommended time intervals to stop and restart blood-thinners, before & after invasive procedures  Generic Name Brand Name Pre-procedure: Stop medication for this amount of time before your procedure: Post-procedure: Wait this amount of time after the procedure before restarting your medication:  Abciximab Reopro 15 days 2 hrs  Alteplase Activase 10 days 10 days  Anagrelide Agrylin    Apixaban Eliquis 3 days 6 hrs  Cilostazol Pletal 3 days 5 hrs  Clopidogrel Plavix 7-10 days 2 hrs  Dabigatran Pradaxa 5 days 6 hrs  Dalteparin Fragmin 24 hours 4 hrs  Dipyridamole Aggrenox 11days 2  hrs  Edoxaban [place] 3 days 2 hrs  Enoxaparin  Lovenox 24 hours 4 hrs  Eptifibatide Integrillin 8 hours 2 hrs  Fondaparinux  Arixtra 72 hours 12 hrs   Hydroxychloroquine Plaquenil 11 days   Prasugrel Effient 7-10 days 6 hrs  Reteplase Retavase 10 days 10 days  Rivaroxaban Xarelto 3 days 6 hrs  Ticagrelor Brilinta 5-7 days 6 hrs  Ticlopidine Ticlid 10-14 days 2 hrs  Tinzaparin Innohep 24 hours 4 hrs  Tirofiban Aggrastat 8 hours 2 hrs  Warfarin Coumadin 5 days 2 hrs   Other medications with blood-thinning effects  NOTE: Consider stopping these if you have prolonged bleeding despite not taking any of the above blood thinners. Otherwise ask your provider and this will be decided on a case-by-case basis.  Product indications Generic (Brand) names Note  Cholesterol Lipitor Stop 4 days before procedure  Blood thinner (injectable) Heparin (LMW or LMWH Heparin) Stop 24 hours before procedure  Cancer Ibrutinib (Imbruvica) Stop 7 days before procedure  Malaria/Rheumatoid Hydroxychloroquine (Plaquenil) Stop 11 days before procedure  Thrombolytics  10 days before or after procedures   Over-the-counter (OTC) Products with blood-thinning effects  NOTE: Consider stopping these if you have prolonged bleeding despite not taking any of the above blood thinners. Otherwise ask your provider and this will be decided on a case-by-case basis.  Product Common names Stop Time  Aspirin > 325 mg Goody Powders, Excedrin, etc. 11 days  Aspirin <= 81 mg  7 days  Fish oil  4 days  Garlic supplements  7 days  Ginkgo biloba  36 hours  Ginseng  24 hours  NSAIDs Ibuprofen, Naprosyn, etc. 3 days  Vitamin E  4 days   ______________________________________________________________________

## 2023-08-29 ENCOUNTER — Ambulatory Visit (HOSPITAL_BASED_OUTPATIENT_CLINIC_OR_DEPARTMENT_OTHER): Admitting: Pain Medicine

## 2023-08-29 ENCOUNTER — Ambulatory Visit

## 2023-08-29 DIAGNOSIS — Z91199 Patient's noncompliance with other medical treatment and regimen due to unspecified reason: Secondary | ICD-10-CM

## 2023-08-29 DIAGNOSIS — M48061 Spinal stenosis, lumbar region without neurogenic claudication: Secondary | ICD-10-CM

## 2023-08-29 DIAGNOSIS — G8929 Other chronic pain: Secondary | ICD-10-CM

## 2023-08-29 DIAGNOSIS — M4310 Spondylolisthesis, site unspecified: Secondary | ICD-10-CM

## 2023-08-29 DIAGNOSIS — M5459 Other low back pain: Secondary | ICD-10-CM

## 2023-08-29 DIAGNOSIS — R937 Abnormal findings on diagnostic imaging of other parts of musculoskeletal system: Secondary | ICD-10-CM

## 2023-08-29 DIAGNOSIS — Z7901 Long term (current) use of anticoagulants: Secondary | ICD-10-CM

## 2023-08-29 DIAGNOSIS — M4306 Spondylolysis, lumbar region: Secondary | ICD-10-CM

## 2023-08-29 DIAGNOSIS — M47817 Spondylosis without myelopathy or radiculopathy, lumbosacral region: Secondary | ICD-10-CM

## 2023-08-29 DIAGNOSIS — M47816 Spondylosis without myelopathy or radiculopathy, lumbar region: Secondary | ICD-10-CM

## 2023-08-29 DIAGNOSIS — M4807 Spinal stenosis, lumbosacral region: Secondary | ICD-10-CM

## 2023-08-30 ENCOUNTER — Ambulatory Visit

## 2023-08-30 ENCOUNTER — Inpatient Hospital Stay

## 2023-08-30 ENCOUNTER — Inpatient Hospital Stay (HOSPITAL_BASED_OUTPATIENT_CLINIC_OR_DEPARTMENT_OTHER): Admitting: Nurse Practitioner

## 2023-08-30 ENCOUNTER — Encounter: Payer: Self-pay | Admitting: Nurse Practitioner

## 2023-08-30 ENCOUNTER — Other Ambulatory Visit: Payer: Self-pay

## 2023-08-30 ENCOUNTER — Inpatient Hospital Stay: Attending: Oncology

## 2023-08-30 VITALS — BP 129/75 | HR 110 | Temp 97.1°F | Resp 16 | Wt 193.0 lb

## 2023-08-30 DIAGNOSIS — Z7722 Contact with and (suspected) exposure to environmental tobacco smoke (acute) (chronic): Secondary | ICD-10-CM | POA: Insufficient documentation

## 2023-08-30 DIAGNOSIS — Z5189 Encounter for other specified aftercare: Secondary | ICD-10-CM | POA: Diagnosis not present

## 2023-08-30 DIAGNOSIS — C3411 Malignant neoplasm of upper lobe, right bronchus or lung: Secondary | ICD-10-CM

## 2023-08-30 DIAGNOSIS — Z79899 Other long term (current) drug therapy: Secondary | ICD-10-CM | POA: Insufficient documentation

## 2023-08-30 DIAGNOSIS — R110 Nausea: Secondary | ICD-10-CM

## 2023-08-30 DIAGNOSIS — Z510 Encounter for antineoplastic radiation therapy: Secondary | ICD-10-CM | POA: Diagnosis not present

## 2023-08-30 LAB — URINE CULTURE: Culture: >=100000 — AB

## 2023-08-30 LAB — CBC WITH DIFFERENTIAL/PLATELET
Abs Immature Granulocytes: 0.04 10*3/uL (ref 0.00–0.07)
Basophils Absolute: 0 10*3/uL (ref 0.0–0.1)
Basophils Relative: 0 %
Eosinophils Absolute: 0 10*3/uL (ref 0.0–0.5)
Eosinophils Relative: 0 %
HCT: 28.6 % — ABNORMAL LOW (ref 36.0–46.0)
Hemoglobin: 9.3 g/dL — ABNORMAL LOW (ref 12.0–15.0)
Immature Granulocytes: 2 %
Lymphocytes Relative: 16 %
Lymphs Abs: 0.4 10*3/uL — ABNORMAL LOW (ref 0.7–4.0)
MCH: 29.8 pg (ref 26.0–34.0)
MCHC: 32.5 g/dL (ref 30.0–36.0)
MCV: 91.7 fL (ref 80.0–100.0)
Monocytes Absolute: 0.4 10*3/uL (ref 0.1–1.0)
Monocytes Relative: 18 %
Neutro Abs: 1.6 10*3/uL — ABNORMAL LOW (ref 1.7–7.7)
Neutrophils Relative %: 64 %
Platelets: 156 10*3/uL (ref 150–400)
RBC: 3.12 MIL/uL — ABNORMAL LOW (ref 3.87–5.11)
RDW: 22.4 % — ABNORMAL HIGH (ref 11.5–15.5)
WBC: 2.4 10*3/uL — ABNORMAL LOW (ref 4.0–10.5)
nRBC: 0 % (ref 0.0–0.2)

## 2023-08-30 LAB — COMPREHENSIVE METABOLIC PANEL WITH GFR
ALT: 18 U/L (ref 0–44)
AST: 25 U/L (ref 15–41)
Albumin: 3.1 g/dL — ABNORMAL LOW (ref 3.5–5.0)
Alkaline Phosphatase: 43 U/L (ref 38–126)
Anion gap: 9 (ref 5–15)
BUN: 6 mg/dL — ABNORMAL LOW (ref 8–23)
CO2: 20 mmol/L — ABNORMAL LOW (ref 22–32)
Calcium: 8.3 mg/dL — ABNORMAL LOW (ref 8.9–10.3)
Chloride: 105 mmol/L (ref 98–111)
Creatinine, Ser: 0.52 mg/dL (ref 0.44–1.00)
GFR, Estimated: >60 mL/min (ref >60)
Glucose, Bld: 207 mg/dL — ABNORMAL HIGH (ref 70–99)
Potassium: 3.2 mmol/L — ABNORMAL LOW (ref 3.5–5.1)
Sodium: 134 mmol/L — ABNORMAL LOW (ref 135–145)
Total Bilirubin: 1 mg/dL (ref 0.0–1.2)
Total Protein: 6 g/dL — ABNORMAL LOW (ref 6.5–8.1)

## 2023-08-30 LAB — MAGNESIUM: Magnesium: 2 mg/dL (ref 1.7–2.4)

## 2023-08-30 MED ORDER — SODIUM CHLORIDE 0.9 % IV SOLN
Freq: Once | INTRAVENOUS | Status: DC
  Filled 2023-08-30: qty 250

## 2023-08-30 MED ORDER — OMEPRAZOLE 20 MG PO CPDR: 20.0000 mg | DELAYED_RELEASE_CAPSULE | Freq: Every day | ORAL | 0 refills | Status: AC

## 2023-08-30 MED ORDER — FAMOTIDINE IN NACL 20-0.9 MG/50ML-% IV SOLN
20.0000 mg | Freq: Once | INTRAVENOUS | Status: AC
  Administered 2023-08-30: 20 mg via INTRAVENOUS
  Filled 2023-08-30: qty 50

## 2023-08-30 MED ORDER — PROCHLORPERAZINE MALEATE 10 MG PO TABS: 10.0000 mg | ORAL_TABLET | Freq: Four times a day (QID) | ORAL | 0 refills | Status: DC | PRN

## 2023-08-30 MED ORDER — FAMOTIDINE IN NACL 20-0.9 MG/50ML-% IV SOLN: 20.0000 mg | Freq: Once | INTRAVENOUS | Status: DC

## 2023-08-30 MED ORDER — PROCHLORPERAZINE EDISYLATE 10 MG/2ML IJ SOLN
10.0000 mg | Freq: Once | INTRAMUSCULAR | Status: AC
Start: 2023-08-30 — End: 2023-08-30
  Administered 2023-08-30: 10 mg via INTRAVENOUS
  Filled 2023-08-30: qty 2

## 2023-08-30 MED ORDER — PROCHLORPERAZINE EDISYLATE 10 MG/2ML IJ SOLN: 10.0000 mg | Freq: Once | INTRAMUSCULAR | Status: DC

## 2023-08-30 MED ORDER — SODIUM CHLORIDE 0.9 % IV SOLN
Freq: Once | INTRAVENOUS | Status: AC
Start: 2023-08-30 — End: 2023-08-30
  Filled 2023-08-30: qty 250

## 2023-08-30 NOTE — Addendum Note (Signed)
 Addended by: Lesle Chris on: 08/30/2023 11:20 AM   Modules accepted: Orders

## 2023-08-30 NOTE — Progress Notes (Signed)
 Symptom Management Clinic  [facility] at [facility] Department of the [place]. [facility] [address] [place], [place] [address] [phone]) [phone] (fax)  Patient Care Team: Barbette Reichmann, MD as PCP - General (Internal Medicine) Glory Buff, RN as Oncology Nurse Navigator Creig Hines, MD as Consulting Physician (Oncology)   Name of the patient: Kara [name]  [id number]  03-27-1951   Date of visit: 08/30/23  Diagnosis- Lung Cancer  Chief complaint/ Reason for visit- Nausea  Heme/Onc history:  Oncology History  Cancer of upper lobe of right lung (HCC)  05/27/2023 Initial Diagnosis   Cancer of upper lobe of right lung (HCC)   07/01/2023 -  Chemotherapy   Patient is on Treatment Plan : LUNG Carboplatin + Paclitaxel + XRT q7d       Interval history- Kara [name] is a 73 y.o. female currently undergoing carbo-taxol chemotherapy with concurrent radiation who presents to [facility] for complaints of nausea and generalized malaise. She says that starting Friday she felt poorly, went to ER and was diagnosed with UTI. She received IV antibiotics and transitioned to oral cephalexin. She started those on Monday d/t pharmacy availability. After her ER visit she's had ongoing nausea. No vomiting. She's tired. Denies fevers. No chest pain or palpitations. No vomiting. Moving her bowels normally. Denies urinary complaints. Denies abdominal pain. She has taken zofran at home but doesn't feel like it has controlled her symptoms.   ECOG FS:2 - Symptomatic, <50% confined to bed  Review of systems- Review of Systems  Constitutional:  Positive for malaise/fatigue. Negative for chills, fever and weight loss.  HENT:  Negative for hearing loss, nosebleeds, sore throat and tinnitus.   Eyes:  Negative for blurred vision and double vision.  Respiratory:  Negative for cough, hemoptysis, shortness of breath  and wheezing.   Cardiovascular:  Negative for chest pain, palpitations and leg swelling.  Gastrointestinal:  Positive for heartburn and nausea. Negative for abdominal pain, blood in stool, constipation, diarrhea, melena and vomiting.  Genitourinary:  Negative for dysuria and urgency.  Musculoskeletal:  Negative for back pain, falls, joint pain and myalgias.  Skin:  Negative for itching and rash.  Neurological:  Negative for dizziness, tingling, sensory change, loss of consciousness, weakness and headaches.  Endo/Heme/Allergies:  Negative for environmental allergies. Does not bruise/bleed easily.  Psychiatric/Behavioral:  Negative for depression. The patient is not nervous/anxious and does not have insomnia.      Allergies  Allergen Reactions   Adhesive [Tape] Other (See Comments)    Pt reports, when removing tape from skin most tapes pull her skin off. Ok to use paper tape   Avapro [Irbesartan] Anaphylaxis    Facial swelling   Morphine And Codeine Nausea And Vomiting   Other Hives    Chlorhexadine wipes/CHG wipes,    Latex Rash    Exam gloves/ does not react around elastic and lips don't swell when blowing balloons    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, 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   CHF (congestive heart failure) (HCC)    followed colon resection, "wouldn't let me get up"   Chronic anticoagulation    Degenerative disc disease, lumbar    Diverticulitis 2018   Diverticulosis    DVT of lower extremity, bilateral (HCC) 2018   Essential hypertension    History of being hospitalized  1 month ago for 3 days, vomiting, and kink in upper intestine   History of hiatal hernia    Mediastinal adenopathy 04/2023   MRSA nasal colonization    Non-small cell lung cancer (HCC) 2022   right   Obesity (BMI 30-39.9)    Osteoarthritis    Osteoporosis    PE (pulmonary thromboembolism) (HCC) 2018   Pneumonia    Small bowel  obstruction (HCC) 03/20/2023   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HCC)    Type 2 diabetes mellitus with obesity (HCC)    Vertigo     Past Surgical History:  Procedure Laterality Date   APPENDECTOMY N/A 02/24/2017   Procedure: Incidental  APPENDECTOMY;  Surgeon: Ricarda Frame, MD;  Location: [name];  Service: General;  Laterality: N/A;   CARPAL TUNNEL RELEASE Bilateral    CATARACT EXTRACTION W/PHACO Left 11/21/2018   Procedure: CATARACT EXTRACTION PHACO AND INTRAOCULAR LENS PLACEMENT (IOC) LEFT DIABETES;  Surgeon: Galen Manila, MD;  Location: [facility];  Service: Ophthalmology;  Laterality: Left;  latex sensitivity Diabetic - oral meds   CATARACT EXTRACTION W/PHACO Right 12/12/2018   Procedure: CATARACT EXTRACTION PHACO AND INTRAOCULAR LENS PLACEMENT (IOC) RIGHT DIABETES;  Surgeon: Galen Manila, MD;  Location: [facility];  Service: Ophthalmology;  Laterality: Right;  Diabetes-oral med Latex sensitiviy   COLON RESECTION SIGMOID N/A 11/02/2016   Procedure: COLON RESECTION SIGMOID;  Surgeon: Ricarda Frame, MD;  Location: [name];  Service: General;  Laterality: N/A;   COLON SURGERY  11/02/2016   COLONOSCOPY WITH PROPOFOL N/A 02/03/2017   Procedure: COLONOSCOPY WITH PROPOFOL;  Surgeon: Toney Reil, MD;  Location: [name];  Service: Gastroenterology;  Laterality: N/A;   COLOSTOMY N/A 11/02/2016   Procedure: COLOSTOMY;  Surgeon: Ricarda Frame, MD;  Location: [name];  Service: General;  Laterality: N/A;   COLOSTOMY REVERSAL N/A 02/24/2017   Procedure: COLOSTOMY REVERSAL, ostomy takedown, spleenic flexure mobilization, excision rectal stump/distal sigmoid, anastomosis with suture reinforcement;  Surgeon: Ricarda Frame, MD;  Location: [name];  Service: General;  Laterality: N/A;   ENDOBRONCHIAL ULTRASOUND N/A 05/16/2023   Procedure: ENDOBRONCHIAL ULTRASOUND;  Surgeon: [name], MD;  Location: [name];  Service: Cardiopulmonary;   Laterality: N/A;   FLEXIBLE BRONCHOSCOPY N/A 05/16/2023   Procedure: FLEXIBLE BRONCHOSCOPY;  Surgeon: [name], MD;  Location: [name];  Service: Cardiopulmonary;  Laterality: N/A;   ILEO LOOP DIVERSION N/A 02/24/2017   Procedure: ILEO LOOP COLOSTOMY;  Surgeon: Ricarda Frame, MD;  Location: [name];  Service: General;  Laterality: N/A;   ILEOSTOMY CLOSURE N/A 06/01/2017   Procedure: LOOP ILEOSTOMY TAKEDOWN;  Surgeon: Ricarda Frame, MD;  Location: [name];  Service: General;  Laterality: N/A;   IR IMAGING GUIDED PORT INSERTION  06/14/2023   IVC FILTER INSERTION Right 10/2016   IVC FILTER REMOVAL N/A 08/09/2017   Procedure: IVC FILTER REMOVAL;  Surgeon: Renford Dills, MD;  Location: [name];  Service: Cardiovascular;  Laterality: N/A;   KNEE SURGERY Right    torn menicus   LAPAROSCOPY N/A 02/24/2017   Procedure: LAPAROSCOPY DIAGNOSTIC;  Surgeon: Ricarda Frame, MD;  Location: [name];  Service: General;  Laterality: N/A;   LYSIS OF ADHESION N/A 02/24/2017   Procedure: LYSIS OF ADHESION;  Surgeon: Ricarda Frame, MD;  Location: [name];  Service: General;  Laterality: N/A;   PULMONARY VENOGRAPHY N/A 11/19/2016   Procedure: Pulmonary Venography; IVC filter placement; possible pulmonary thrombectomy;  Surgeon: Renford Dills, MD;  Location: [name];  Service:  Cardiovascular;  Laterality: N/A;   SACROPLASTY N/A 11/08/2017   Procedure: SACROPLASTY S2;  Surgeon: Kennedy Bucker, MD;  Location: [name];  Service: Orthopedics;  Laterality: N/A;   SIGMOIDOSCOPY N/A 11/13/2016   Procedure: endoscopic  flexible SIGMOIDOSCOPY;  Surgeon: Lattie Haw, MD;  Location: [name];  Service: General;  Laterality: N/A;   SIGMOIDOSCOPY N/A 05/19/2017   Procedure: Arnell Sieving;  Surgeon: Ricarda Frame, MD;  Location: [name];  Service: General;  Laterality: N/A;   VIDEO BRONCHOSCOPY WITH ENDOBRONCHIAL NAVIGATION N/A 02/23/2021    Procedure: ROBOTIC VIDEO BRONCHOSCOPY WITH ENDOBRONCHIAL NAVIGATION;  Surgeon: [name], MD;  Location: [name];  Service: Pulmonary;  Laterality: N/A;    Social History   Socioeconomic History   Marital status: Married    Spouse name: Broadus John   Number of children: 1   Years of education: Not on file   Highest education level: Not on file  Occupational History   Not on file  Tobacco Use   Smoking status: Never    Passive exposure: Current (husband smokes)   Smokeless tobacco: Never  Vaping Use   Vaping status: Never Used  Substance and Sexual Activity   Alcohol use: No    Alcohol/week: 0.0 standard drinks of alcohol   Drug use: No   Sexual activity: Never  Other Topics Concern   Not on file  Social History Narrative   Not on file   Social Drivers of Health   Financial Resource Strain: Low Risk  (05/19/2023)   Received from [facility]   Overall Financial Resource Strain (CARDIA)    Difficulty of Paying Living Expenses: Not hard at all  Food Insecurity: No Food Insecurity (05/19/2023)   Received from [facility]   Hunger Vital Sign    Worried About Running Out of Food in the Last Year: Never true    Ran Out of Food in the Last Year: Never true  Transportation Needs: No Transportation Needs (05/19/2023)   Received from [facility] - Transportation    In the past 12 months, has lack of transportation kept you from medical appointments or from getting medications?: No    Lack of Transportation (Non-Medical): No  Physical Activity: Not on file  Stress: Not on file  Social Connections: Not on file  Intimate Partner Violence: Not At Risk (05/09/2023)   Humiliation, Afraid, Rape, and Kick questionnaire    Fear of Current or Ex-Partner: No    Emotionally Abused: No    Physically Abused: No    Sexually Abused: No    Family History  Problem Relation Age of Onset   Diabetes Mother    Heart disease  Mother    Cancer Mother    Breast cancer Mother        >50   Heart disease Father    Diabetes Sister    Heart disease Sister      Current Outpatient Medications:    azithromycin (ZITHROMAX) 250 MG tablet, Two tabs po day 1 and one tab po daily afterwards, Disp: 6 each, Rfl: 0   bisacodyl (DULCOLAX) 10 MG suppository, Place 1 suppository (10 mg total) rectally as needed for moderate constipation., Disp: 12 suppository, Rfl: 0   candesartan (ATACAND) 16 MG tablet, Take 16 mg by mouth in the morning., Disp: , Rfl:    cephALEXin (KEFLEX) 500 MG capsule, Take 1 capsule (500 mg total) by mouth 2 (two) times daily for 5 days., Disp: 10  capsule, Rfl: 0   cholecalciferol (VITAMIN D) 25 MCG (1000 UNIT) tablet, Take 1,000 Units by mouth in the morning., Disp: , Rfl:    Cyanocobalamin (VITAMIN B-12) 5000 MCG TBDP, Take 5,000 mcg by mouth in the morning., Disp: , Rfl:    cyclobenzaprine (FLEXERIL) 10 MG tablet, Take 10 mg by mouth at bedtime., Disp: , Rfl:    dexamethasone (DECADRON) 4 MG tablet, TAKE 2 TABLETS BY MOUTH ONCE DAILY FOR 2DAYS. START DAY AFTER CHEMO. TAKE W/ FOOD, Disp: 30 tablet, Rfl: 1   Dulaglutide (TRULICITY) 1.5 MG/0.5ML SOAJ, Inject 1.5 mg into the skin once a week. Saturday, Disp: , Rfl:    gabapentin (NEURONTIN) 100 MG capsule, Take 200 mg by mouth with breakfast, with lunch, and with evening meal., Disp: , Rfl:    gabapentin (NEURONTIN) 300 MG capsule, Take 300 mg by mouth at bedtime., Disp: , Rfl:    glipiZIDE (GLUCOTROL) 10 MG tablet, Take 10 mg by mouth daily before breakfast., Disp: , Rfl:    HYDROcodone-acetaminophen (NORCO/VICODIN) 5-325 MG tablet, Take 1 tablet by mouth every 6 (six) hours as needed for moderate pain., Disp: , Rfl:    Magnesium Oxide -Mg Supplement 500 MG TABS, Take 1 tablet by mouth at bedtime., Disp: , Rfl:    ondansetron (ZOFRAN) 8 MG tablet, TAKE 1 TABLET BY MOUTH EVERY 8 HOURS AS NEEDED FOR NAUSEA OR VOMITING. START ON THE 3RD DAY AFTER CHEMOTHERAPY,  Disp: 30 tablet, Rfl: 1   ONETOUCH ULTRA TEST test strip, , Disp: , Rfl:    oxybutynin (DITROPAN) 5 MG tablet, Take 1 tablet by mouth 2 (two) times daily., Disp: , Rfl:    potassium chloride SA (KLOR-CON M) 20 MEQ tablet, Take 1 tablet (20 mEq total) by mouth daily., Disp: 30 tablet, Rfl: 1   prochlorperazine (COMPAZINE) 10 MG tablet, Take 1 tablet (10 mg total) by mouth every 6 (six) hours as needed for nausea or vomiting., Disp: 30 tablet, Rfl: 1   rOPINIRole (REQUIP) 1 MG tablet, Take 1 mg by mouth at bedtime., Disp: , Rfl:    sucralfate (CARAFATE) 1 g tablet, Take 1 tablet (1 g total) by mouth 3 (three) times daily with meals. Dissolve tablet in 4 tablesppons warm water swish and swallow 30 min before meals. (Patient taking differently: Take 1 g by mouth 3 (three) times daily with meals. Dissolve tablet in 4 tablespoons warm water swish and swallow 30 min before meals.), Disp: 90 tablet, Rfl: 0   warfarin (COUMADIN) 6 MG tablet, Take 6 mg by mouth at bedtime., Disp: , Rfl:   Physical exam:  Vitals:   08/30/23 0947  BP: 129/75  Pulse: (!) 110  Resp: 16  Temp: (!) 97.1 F (36.2 C)  TempSrc: Tympanic  SpO2: 99%  Weight: 193 lb (87.5 kg)   Physical Exam Vitals reviewed.  Constitutional:      Appearance: She is not ill-appearing.  Cardiovascular:     Rate and Rhythm: Normal rate and regular rhythm.  Pulmonary:     Effort: No respiratory distress.  Abdominal:     General: There is no distension.     Tenderness: There is no abdominal tenderness.  Neurological:     Mental Status: She is alert. Mental status is at baseline.  Psychiatric:        Mood and Affect: Mood normal.        Behavior: Behavior normal.        Latest Ref Rng & Units 08/30/2023    9:36  AM  CMP  Glucose 70 - 99 mg/dL 161   BUN 8 - 23 mg/dL 6   Creatinine 0.96 - 0.45 mg/dL 4.09   Sodium 811 - 914 mmol/L 134   Potassium 3.5 - 5.1 mmol/L 3.2   Chloride 98 - 111 mmol/L 105   CO2 22 - 32 mmol/L 20   Calcium  8.9 - 10.3 mg/dL 8.3   Total Protein 6.5 - 8.1 g/dL 6.0   Total Bilirubin 0.0 - 1.2 mg/dL 1.0   Alkaline Phos 38 - 126 U/L 43   AST 15 - 41 U/L 25   ALT 0 - 44 U/L 18       Latest Ref Rng & Units 08/30/2023    9:36 AM  CBC  WBC 4.0 - 10.5 K/uL 2.4   Hemoglobin 12.0 - 15.0 g/dL 9.3   Hematocrit 78.2 - 46.0 % 28.6   Platelets 150 - 400 K/uL 156    DG Chest 2 View Result Date: 08/27/2023 CLINICAL DATA:  Sepsis.  Non-small-cell lung cancer. EXAM: CHEST - 2 VIEW COMPARISON:  05/16/2023 FINDINGS: Similar appearance of bandlike opacity in the right mid lung characterized as hypermetabolic on PET-CT 06/03/2023. There is some overlying pleural thickening, stable in the interval. Right base atelectasis with tiny right pleural effusion similar to prior. Left lung remains clear. Cardiopericardial silhouette is at upper limits of normal for size. Right Port-A-Cath is new in the interval with tip overlying the mid SVC level. IMPRESSION: 1. Similar appearance of bandlike opacity in the right mid lung characterized as hypermetabolic on PET-CT 06/03/2023 and compatible with the patient's known lung lesion. 2. Right base atelectasis with tiny right pleural effusion, similar to prior. Electronically Signed   By: Kennith Center M.D.   On: 08/27/2023 13:13   CT Angio Chest PE W/Cm &/Or Wo Cm Result Date: 08/08/2023 CLINICAL DATA:  Dizziness, weakness, history of lung cancer, EXAM: CT ANGIOGRAPHY CHEST WITH CONTRAST TECHNIQUE: Multidetector CT imaging of the chest was performed using the standard protocol during bolus administration of intravenous contrast. Multiplanar CT image reconstructions and MIPs were obtained to evaluate the vascular anatomy. RADIATION DOSE REDUCTION: This exam was performed according to the departmental dose-optimization program which includes automated exposure control, adjustment of the mA and/or kV according to patient size and/or use of iterative reconstruction technique. CONTRAST:  75mL  OMNIPAQUE IOHEXOL 350 MG/ML SOLN COMPARISON:  PET/CT 06/03/2023 and radiograph 05/16/2023 FINDINGS: Cardiovascular: No central pulmonary embolism. Question nonocclusive filling defects within a segmental branch the right lower lobe pulmonary arteries (circa series 4/image 73). However evaluation is limited by significant artifact in this area. No pericardial effusion. Mild aortic atherosclerotic calcification. Right chest wall Port-A-Cath. Mediastinum/Nodes: Bowing of the posterior trachea compatible with expiratory phase. Esophagus is unremarkable. 11 mm subcarinal lymph node is unchanged from 06/03/2023 PET/CT. Lungs/Pleura: Unchanged right hilar mass/consolidation. This was hypermetabolic on PET/CT 06/03/2023. Mild mosaic attenuation of the lungs compatible with air trapping. Small right pleural effusion. No pneumothorax. Upper Abdomen: Cholelithiasis. No evidence of acute cholecystitis. Hepatic steatosis. No acute abnormality. Musculoskeletal: No acute fracture. Review of the MIP images confirms the above findings. IMPRESSION: 1. No central pulmonary embolism. Possible filling defects within a segmental branch of the right lower lobe pulmonary arteries is favored artifactual given significant motion in this area nonocclusive pulmonary embolism is considered less likely though unable to be excluded. 2. Unchanged right hilar mass/consolidation. This was hypermetabolic on PET/CT 06/03/2023. 3. Small right pleural effusion. 4. Mosaic attenuation of the lungs compatible with  air trapping. 5. Cholelithiasis. 6. Hepatic steatosis. 7. Aortic Atherosclerosis (ICD10-I70.0). Electronically Signed   By: Minerva Fester M.D.   On: 08/08/2023 03:23   US Venous Img Lower Bilateral Result Date: 08/08/2023 CLINICAL DATA:  Lower extremity pain EXAM: Bilateral  LOWER EXTREMITY VENOUS DOPPLER ULTRASOUND TECHNIQUE: Gray-scale sonography with compression, as well as color and duplex ultrasound, were performed to evaluate the deep  venous system(s) from the level of the common femoral vein through the popliteal and proximal calf veins. COMPARISON:  ultrasound 04/10/2018 FINDINGS: VENOUS Normal compressibility of the common femoral, superficial femoral, and popliteal veins, as well as the visualized calf veins. Visualized portions of profunda femoral vein and great saphenous vein unremarkable. No filling defects to suggest DVT on grayscale or color Doppler imaging. Doppler waveforms show normal direction of venous flow, normal respiratory plasticity and response to augmentation. Limited views of the contralateral common femoral vein are unremarkable. OTHER None. Limitations: none IMPRESSION: Negative. Electronically Signed   By: Minerva Fester M.D.   On: 08/08/2023 02:10   CT ANGIO GI BLEED Addendum Date: 08/02/2023 ADDENDUM REPORT: 08/02/2023 15:41 ADDENDUM: Upon further review there is active arterial extravasation within a loop of bowel in the right lower quadrant at the site of prior distal small bowel anastomosis. Marliss Coots, MD [facility] Electronically Signed   By: Marliss Coots M.D.   On: 08/02/2023 15:41   Result Date: 08/02/2023 CLINICAL DATA:  73 year old female with history of lower gastrointestinal hemorrhage. EXAM: CTA ABDOMEN AND PELVIS WITHOUT AND WITH CONTRAST TECHNIQUE: Multidetector CT imaging of the abdomen and pelvis was performed using the standard protocol during bolus administration of intravenous contrast. Multiplanar reconstructed images and MIPs were obtained and reviewed to evaluate the vascular anatomy. RADIATION DOSE REDUCTION: This exam was performed according to the departmental dose-optimization program which includes automated exposure control, adjustment of the mA and/or kV according to patient size and/or use of iterative reconstruction technique. CONTRAST:  OMNIPAQUE IOHEXOL 350 MG/ML SOLN COMPARISON:  07/06/2023 FINDINGS: VASCULAR  Aorta: Normal caliber aorta without aneurysm, dissection, vasculitis or significant stenosis. Mild scattered atherosclerotic calcifications. Celiac: Patent without evidence of aneurysm, dissection, vasculitis or significant stenosis. SMA: Patent without evidence of aneurysm, dissection, vasculitis or significant stenosis. Renals: Single bilateral renal arteries are patent without evidence of aneurysm, dissection, vasculitis, fibromuscular dysplasia or significant stenosis. IMA: Patent without evidence of aneurysm, dissection, vasculitis or significant stenosis. Inflow: Patent without evidence of aneurysm, dissection, vasculitis or significant stenosis. Proximal Outflow: Bilateral common femoral and visualized portions of the superficial and profunda femoral arteries are patent without evidence of aneurysm, dissection, vasculitis or significant stenosis. Veins: No obvious venous abnormality within the limitations of this arterial phase study. Review of the MIP images confirms the above findings. NON-VASCULAR Lower chest: No acute abnormality. Hepatobiliary: No focal liver abnormality is seen. Unchanged appearance of multiple peripherally calcified gallstones layering in the gallbladder neck without evidence of gallbladder distension, wall thickening, or pericholecystic fluid. No intra or extrahepatic biliary ductal dilation. Pancreas: Unremarkable. No pancreatic ductal dilatation or surrounding inflammatory changes. Spleen: Normal in size without focal abnormality. Adrenals/Urinary Tract: Adrenal glands are unremarkable. Kidneys are normal, without renal calculi, focal lesion, or hydronephrosis. Bladder is unremarkable. Stomach/Bowel: Stomach is within normal limits. Similar appearing small bowel surgical anastomosis in the anterior lower abdomen without complicating features. Unchanged colo-colonic anastomosis without complicating features at the rectosigmoid junction. No evidence of bowel wall thickening,  distention, or inflammatory changes. There is focal hyperenhancement on venous  phase only within the lumen of the distal rectum. Lymphatic: No abdominopelvic lymphadenopathy. Reproductive: Uterus and bilateral adnexa are unremarkable. Other: Unchanged supraumbilical hernia containing transverse colon and small bowel without evidence of strangulation or obstruction. Musculoskeletal: No acute osseous abnormality. Similar appearing multilevel degenerative changes of the visualized thoracolumbar spine. IMPRESSION: 1. Venous phase intraluminal extravasation about the distal rectum suggestive of internal hemorrhoidal hemorrhage. No additional evidence of gastrointestinal hemorrhage. 2. Cholelithiasis. 3.  Aortic Atherosclerosis (ICD10-I70.0). Marliss Coots, MD [facility] Electronically Signed: By: Marliss Coots M.D. On: 08/02/2023 14:12   Assessment and plan- Patient is a 73 y.o. adult   UTI- s/p ER visit on 08/27/23 with UA concerning for UTI. She received IV rocephin then started on cephalexin 500 mg BID.  Convalescence following chemotherapy- currently s/p cycle 7 of weekly carbo-taxol on 08/19/23 with concurrent radiation completed 08/23/23.  Nausea- less likely related to chemotherapy given onset of symptoms. Question if radiation contributing? Low suspicion that nausea is related to antibiotics as she has tolerated keflex well in the past. Continue zofran. Compazine 10 mg IV in clinic along with IV Fluids. Symptoms improved. Start compazine at home for breakthrough. Start omeprazole 20 mg daily.   Disposition:  Follow up as scheduled. RTC sooner if symptoms don't improve or resolve.    Visit Diagnosis 1. Nausea without vomiting   2. 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 Convalescence following radiotherapy    Patient expressed understanding and was in agreement with this plan. She also understands that She can call clinic at any time with  any questions, concerns, or complaints.   Thank you for allowing me to participate in the care of this very pleasant patient.   Consuello Masse, DNP, AGNP-C, AOCNP [facility] [phone]

## 2023-08-30 NOTE — Patient Instructions (Signed)
Nausea, Adult Nausea is feeling like you may vomit. Feeling like you may vomit is usually not serious, but it may be an early sign of a more serious medical problem. Vomiting is when stomach contents forcefully come out of your mouth. If you vomit, or if you are not able to drink enough fluids, you may not have enough water in your body (get dehydrated). If you do not have enough water in your body, you may: Feel tired. Feel thirsty. Have a dry mouth. Have cracked lips. Pee (urinate) less often. Older adults and people who have other diseases or a weak body defense system (immune system) have a higher risk of not having enough water in the body. The main goals of treating this condition are: To relieve your nausea. To ensure your nausea occurs less often. To prevent vomiting and losing too much fluid. Follow these instructions at home: Watch your symptoms for any changes. Tell your doctor about them. Eating and drinking     Take an ORS (oral rehydration solution). This is a drink that is sold at pharmacies and stores. Drink clear fluids in small amounts as you are able. These include: Water. Ice chips. Fruit juice that has water added (diluted fruit juice). Low-calorie sports drinks. Eat bland, easy-to-digest foods in small amounts as you are able, such as: Bananas. Applesauce. Rice. Low-fat (lean) meats. Toast. Crackers. Avoid drinking fluids that have a lot of sugar or caffeine in them. This includes energy drinks, sports drinks, and soda. Avoid alcohol. Avoid spicy or fatty foods. General instructions Take over-the-counter and prescription medicines only as told by your doctor. Rest at home while you get better. Drink enough fluid to keep your pee (urine) pale yellow. Take slow and deep breaths when you feel like you may vomit. Avoid food or things that have strong smells. Wash your hands often with soap and water for at least 20 seconds. If you cannot use soap and water,  use hand sanitizer. Make sure that everyone in your home washes their hands well and often. Keep all follow-up visits. Contact a doctor if: You feel worse. You feel like you may vomit and this lasts for more than 2 days. You vomit. You are not able to drink fluids without vomiting. You have new symptoms. You have a fever. You have a headache. You have muscle cramps. You have a rash. You have pain while peeing. You feel light-headed or dizzy. Get help right away if: You have pain in your chest, neck, arm, or jaw. You feel very weak or you faint. You have vomit that is bright red or looks like coffee grounds. You have bloody or black poop (stools) or poop that looks like tar. You have a very bad headache, a stiff neck, or both. You have very bad pain, cramping, or bloating in your belly (abdomen). You have trouble breathing or you are breathing very quickly. Your heart is beating very quickly. Your skin feels cold and clammy. You feel confused. You have signs of losing too much water in your body, such as: Dark pee, very little pee, or no pee. Cracked lips. Dry mouth. Sunken eyes. Sleepiness. Weakness. These symptoms may be an emergency. Get help right away. Call 911. Do not wait to see if the symptoms will go away. Do not drive yourself to the hospital. Summary Nausea is feeling like you are about vomit. If you vomit, or if you are not able to drink enough fluids, you may not have enough water in   your body (get dehydrated). Eat and drink what your doctor tells you. Take over-the-counter and prescription medicines only as told by your doctor. Contact a doctor right away if your symptoms get worse or you have new symptoms. Keep all follow-up visits. This information is not intended to replace advice given to you by your health care provider. Make sure you discuss any questions you have with your health care provider. Document Revised: 11/14/2020 Document Reviewed:  11/14/2020 Elsevier Patient Education  2024 Elsevier Inc.  

## 2023-08-31 ENCOUNTER — Other Ambulatory Visit: Payer: Self-pay

## 2023-08-31 ENCOUNTER — Ambulatory Visit

## 2023-08-31 ENCOUNTER — Ambulatory Visit
Admission: RE | Admit: 2023-08-31 | Discharge: 2023-08-31 | Discharge status: Home / Self Care | Source: Ambulatory Visit | Attending: Radiation Oncology | Admitting: Radiation Oncology

## 2023-08-31 DIAGNOSIS — C3411 Malignant neoplasm of upper lobe, right bronchus or lung: Secondary | ICD-10-CM | POA: Diagnosis not present

## 2023-08-31 DIAGNOSIS — Z510 Encounter for antineoplastic radiation therapy: Secondary | ICD-10-CM | POA: Diagnosis not present

## 2023-08-31 LAB — RAD ONC ARIA SESSION SUMMARY
Course Elapsed Days: 64
Plan Fractions Treated to Date: 28
Plan Prescribed Dose Per Fraction: 2 Gy
Plan Total Fractions Prescribed: 35
Plan Total Prescribed Dose: 70 Gy
Reference Point Dosage Given to Date: 56 Gy
Reference Point Session Dosage Given: 2 Gy
Session Number: 28

## 2023-09-01 ENCOUNTER — Other Ambulatory Visit: Payer: Self-pay

## 2023-09-01 ENCOUNTER — Ambulatory Visit
Admission: RE | Admit: 2023-09-01 | Discharge: 2023-09-01 | Disposition: A | Discharge status: Home / Self Care | Source: Ambulatory Visit | Attending: Radiation Oncology | Admitting: Radiation Oncology

## 2023-09-01 ENCOUNTER — Ambulatory Visit

## 2023-09-01 DIAGNOSIS — C3411 Malignant neoplasm of upper lobe, right bronchus or lung: Secondary | ICD-10-CM | POA: Diagnosis not present

## 2023-09-01 DIAGNOSIS — Z510 Encounter for antineoplastic radiation therapy: Secondary | ICD-10-CM | POA: Diagnosis not present

## 2023-09-01 LAB — RAD ONC ARIA SESSION SUMMARY
Course Elapsed Days: 65
Plan Fractions Treated to Date: 29
Plan Prescribed Dose Per Fraction: 2 Gy
Plan Total Fractions Prescribed: 35
Plan Total Prescribed Dose: 70 Gy
Reference Point Dosage Given to Date: 58 Gy
Reference Point Session Dosage Given: 2 Gy
Session Number: 29

## 2023-09-01 LAB — CULTURE, BLOOD (ROUTINE X 2)
Culture: NO GROWTH
Culture: NO GROWTH

## 2023-09-02 ENCOUNTER — Ambulatory Visit
Admission: RE | Admit: 2023-09-02 | Discharge: 2023-09-02 | Disposition: A | Discharge status: Home / Self Care | Source: Ambulatory Visit | Attending: Radiation Oncology | Admitting: Radiation Oncology

## 2023-09-02 ENCOUNTER — Inpatient Hospital Stay

## 2023-09-02 ENCOUNTER — Other Ambulatory Visit: Payer: Self-pay

## 2023-09-02 ENCOUNTER — Other Ambulatory Visit: Payer: Self-pay | Admitting: Nurse Practitioner

## 2023-09-02 ENCOUNTER — Encounter: Payer: Self-pay | Admitting: Internal Medicine

## 2023-09-02 ENCOUNTER — Ambulatory Visit

## 2023-09-02 DIAGNOSIS — C3411 Malignant neoplasm of upper lobe, right bronchus or lung: Secondary | ICD-10-CM | POA: Diagnosis not present

## 2023-09-02 DIAGNOSIS — Z7901 Long term (current) use of anticoagulants: Secondary | ICD-10-CM | POA: Diagnosis not present

## 2023-09-02 DIAGNOSIS — Z86711 Personal history of pulmonary embolism: Secondary | ICD-10-CM | POA: Diagnosis not present

## 2023-09-02 DIAGNOSIS — Z510 Encounter for antineoplastic radiation therapy: Secondary | ICD-10-CM | POA: Diagnosis not present

## 2023-09-02 LAB — CBC WITH DIFFERENTIAL (CANCER CENTER ONLY)
Abs Immature Granulocytes: 0.04 10*3/uL (ref 0.00–0.07)
Basophils Absolute: 0 10*3/uL (ref 0.0–0.1)
Basophils Relative: 0 %
Eosinophils Absolute: 0 10*3/uL (ref 0.0–0.5)
Eosinophils Relative: 1 %
HCT: 27.7 % — ABNORMAL LOW (ref 36.0–46.0)
Hemoglobin: 8.7 g/dL — ABNORMAL LOW (ref 12.0–15.0)
Immature Granulocytes: 2 %
Lymphocytes Relative: 22 %
Lymphs Abs: 0.6 10*3/uL — ABNORMAL LOW (ref 0.7–4.0)
MCH: 29.7 pg (ref 26.0–34.0)
MCHC: 31.4 g/dL (ref 30.0–36.0)
MCV: 94.5 fL (ref 80.0–100.0)
Monocytes Absolute: 0.4 10*3/uL (ref 0.1–1.0)
Monocytes Relative: 17 %
Neutro Abs: 1.5 10*3/uL — ABNORMAL LOW (ref 1.7–7.7)
Neutrophils Relative %: 58 %
Platelet Count: 152 10*3/uL (ref 150–400)
RBC: 2.93 MIL/uL — ABNORMAL LOW (ref 3.87–5.11)
RDW: 23.5 % — ABNORMAL HIGH (ref 11.5–15.5)
WBC Count: 2.6 10*3/uL — ABNORMAL LOW (ref 4.0–10.5)
nRBC: 0 % (ref 0.0–0.2)

## 2023-09-02 LAB — CMP (CANCER CENTER ONLY)
ALT: 15 U/L (ref 0–44)
AST: 22 U/L (ref 15–41)
Albumin: 3 g/dL — ABNORMAL LOW (ref 3.5–5.0)
Alkaline Phosphatase: 54 U/L (ref 38–126)
Anion gap: 8 (ref 5–15)
BUN: 8 mg/dL (ref 8–23)
CO2: 21 mmol/L — ABNORMAL LOW (ref 22–32)
Calcium: 7.9 mg/dL — ABNORMAL LOW (ref 8.9–10.3)
Chloride: 107 mmol/L (ref 98–111)
Creatinine: 0.55 mg/dL (ref 0.44–1.00)
GFR, Estimated: >60 mL/min (ref >60)
Glucose, Bld: 272 mg/dL — ABNORMAL HIGH (ref 70–99)
Potassium: 3.3 mmol/L — ABNORMAL LOW (ref 3.5–5.1)
Sodium: 136 mmol/L (ref 135–145)
Total Bilirubin: 0.5 mg/dL (ref 0.0–1.2)
Total Protein: 5.7 g/dL — ABNORMAL LOW (ref 6.5–8.1)

## 2023-09-02 LAB — RAD ONC ARIA SESSION SUMMARY
Course Elapsed Days: 66
Plan Fractions Treated to Date: 30
Plan Prescribed Dose Per Fraction: 2 Gy
Plan Total Fractions Prescribed: 35
Plan Total Prescribed Dose: 70 Gy
Reference Point Dosage Given to Date: 60 Gy
Reference Point Session Dosage Given: 2 Gy
Session Number: 30

## 2023-09-05 ENCOUNTER — Ambulatory Visit
Admission: RE | Admit: 2023-09-05 | Discharge: 2023-09-05 | Disposition: A | Discharge status: Home / Self Care | Source: Ambulatory Visit | Attending: Radiation Oncology | Admitting: Radiation Oncology

## 2023-09-05 ENCOUNTER — Ambulatory Visit

## 2023-09-05 ENCOUNTER — Other Ambulatory Visit: Payer: Self-pay

## 2023-09-05 DIAGNOSIS — C3411 Malignant neoplasm of upper lobe, right bronchus or lung: Secondary | ICD-10-CM | POA: Diagnosis not present

## 2023-09-05 DIAGNOSIS — Z510 Encounter for antineoplastic radiation therapy: Secondary | ICD-10-CM | POA: Diagnosis not present

## 2023-09-05 LAB — RAD ONC ARIA SESSION SUMMARY
Course Elapsed Days: 69
Plan Fractions Treated to Date: 31
Plan Prescribed Dose Per Fraction: 2 Gy
Plan Total Fractions Prescribed: 35
Plan Total Prescribed Dose: 70 Gy
Reference Point Dosage Given to Date: 62 Gy
Reference Point Session Dosage Given: 2 Gy
Session Number: 31

## 2023-09-06 ENCOUNTER — Ambulatory Visit

## 2023-09-06 ENCOUNTER — Other Ambulatory Visit: Payer: Self-pay

## 2023-09-06 ENCOUNTER — Ambulatory Visit
Admission: RE | Admit: 2023-09-06 | Discharge: 2023-09-06 | Disposition: A | Discharge status: Home / Self Care | Source: Ambulatory Visit | Attending: Radiation Oncology | Admitting: Radiation Oncology

## 2023-09-06 DIAGNOSIS — Z510 Encounter for antineoplastic radiation therapy: Secondary | ICD-10-CM | POA: Diagnosis not present

## 2023-09-06 DIAGNOSIS — Z7901 Long term (current) use of anticoagulants: Secondary | ICD-10-CM | POA: Diagnosis not present

## 2023-09-06 DIAGNOSIS — C3411 Malignant neoplasm of upper lobe, right bronchus or lung: Secondary | ICD-10-CM | POA: Diagnosis not present

## 2023-09-06 DIAGNOSIS — Z86711 Personal history of pulmonary embolism: Secondary | ICD-10-CM | POA: Diagnosis not present

## 2023-09-06 LAB — RAD ONC ARIA SESSION SUMMARY
Course Elapsed Days: 70
Plan Fractions Treated to Date: 32
Plan Prescribed Dose Per Fraction: 2 Gy
Plan Total Fractions Prescribed: 35
Plan Total Prescribed Dose: 70 Gy
Reference Point Dosage Given to Date: 64 Gy
Reference Point Session Dosage Given: 2 Gy
Session Number: 32

## 2023-09-07 ENCOUNTER — Ambulatory Visit

## 2023-09-07 ENCOUNTER — Other Ambulatory Visit: Payer: Self-pay

## 2023-09-07 ENCOUNTER — Ambulatory Visit
Admission: RE | Admit: 2023-09-07 | Discharge: 2023-09-07 | Disposition: A | Discharge status: Home / Self Care | Source: Ambulatory Visit | Attending: Radiation Oncology | Admitting: Radiation Oncology

## 2023-09-07 DIAGNOSIS — C3411 Malignant neoplasm of upper lobe, right bronchus or lung: Secondary | ICD-10-CM | POA: Diagnosis not present

## 2023-09-07 DIAGNOSIS — Z510 Encounter for antineoplastic radiation therapy: Secondary | ICD-10-CM | POA: Diagnosis not present

## 2023-09-07 LAB — RAD ONC ARIA SESSION SUMMARY
Course Elapsed Days: 71
Plan Fractions Treated to Date: 33
Plan Prescribed Dose Per Fraction: 2 Gy
Plan Total Fractions Prescribed: 35
Plan Total Prescribed Dose: 70 Gy
Reference Point Dosage Given to Date: 66 Gy
Reference Point Session Dosage Given: 2 Gy
Session Number: 33

## 2023-09-08 ENCOUNTER — Ambulatory Visit

## 2023-09-08 ENCOUNTER — Ambulatory Visit
Admission: RE | Admit: 2023-09-08 | Discharge: 2023-09-08 | Disposition: A | Discharge status: Home / Self Care | Source: Ambulatory Visit | Attending: Radiation Oncology | Admitting: Radiation Oncology

## 2023-09-08 ENCOUNTER — Other Ambulatory Visit: Payer: Self-pay

## 2023-09-08 DIAGNOSIS — Z510 Encounter for antineoplastic radiation therapy: Secondary | ICD-10-CM | POA: Diagnosis not present

## 2023-09-08 DIAGNOSIS — C3411 Malignant neoplasm of upper lobe, right bronchus or lung: Secondary | ICD-10-CM | POA: Diagnosis not present

## 2023-09-08 LAB — RAD ONC ARIA SESSION SUMMARY
Course Elapsed Days: 72
Plan Fractions Treated to Date: 34
Plan Prescribed Dose Per Fraction: 2 Gy
Plan Total Fractions Prescribed: 35
Plan Total Prescribed Dose: 70 Gy
Reference Point Dosage Given to Date: 68 Gy
Reference Point Session Dosage Given: 2 Gy
Session Number: 34

## 2023-09-09 ENCOUNTER — Other Ambulatory Visit: Payer: Self-pay

## 2023-09-09 ENCOUNTER — Ambulatory Visit

## 2023-09-09 ENCOUNTER — Ambulatory Visit
Admission: RE | Admit: 2023-09-09 | Discharge: 2023-09-09 | Disposition: A | Discharge status: Home / Self Care | Source: Ambulatory Visit | Attending: Radiation Oncology | Admitting: Radiation Oncology

## 2023-09-09 DIAGNOSIS — Z510 Encounter for antineoplastic radiation therapy: Secondary | ICD-10-CM | POA: Diagnosis not present

## 2023-09-09 DIAGNOSIS — C3411 Malignant neoplasm of upper lobe, right bronchus or lung: Secondary | ICD-10-CM | POA: Diagnosis not present

## 2023-09-09 LAB — RAD ONC ARIA SESSION SUMMARY
Course Elapsed Days: 73
Plan Fractions Treated to Date: 35
Plan Prescribed Dose Per Fraction: 2 Gy
Plan Total Fractions Prescribed: 35
Plan Total Prescribed Dose: 70 Gy
Reference Point Dosage Given to Date: 70 Gy
Reference Point Session Dosage Given: 2 Gy
Session Number: 35

## 2023-09-12 NOTE — Radiation Completion Notes (Signed)
 Patient Name: Kara [name], Kara [name] MRN: [id number] Date of Birth: [birth year] Referring Physician: Antonio Baumgarten, M.D. Date of Service: 2023-09-12 Radiation Oncologist: Glenis Langdon, M.D. 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  She was recently mid to the hospital for symptoms of abdominal pain and chest pain at which time.  She was noted to have a right pleural effusion also increased size of previous demonstrated amorphous soft tissue scarring the right hilar perihilar region which was more masslike in appearance back in December.  PET scan in November also showed hypermetabolic left cervical mediastinal and right hilar lymph nodes progressive since her prior PET CT scan.  Again was noted hypermetabolic activity in Morphis soft tissue treatment related to the scarring of the right hilar region concerning for recurrent disease.  There was also a 1.5 nodular lung opacity at the dome of the right hemidiaphragm new since August 24 showing low-level FDG accumulation.  There is no evidence of metastatic disease in the abdomen or pelvis.  Patient complains of dyspnea on exertion and shortness of breath she is having no significant cough or hemoptysis at this time.  She is now referred radiation oncology for consideration of concurrent treatment.      ==========DELIVERED PLANS==========  First Treatment Date: 2023-06-28 Last Treatment Date: 2023-09-09   Plan Name: Lung_R Site: Lung, Right Technique: IMRT Mode: Photon Dose Per Fraction: 2 Gy Prescribed Dose (Delivered / Prescribed): 70 Gy / 70 Gy Prescribed Fxs (Delivered / Prescribed): 35 / 35     ==========ON  TREATMENT VISIT DATES========== 2023-06-28, 2023-07-05, 2023-07-12, 2023-07-19, 2023-07-26, 2023-08-16, 2023-08-23, 2023-08-23, 2023-08-31, 2023-09-06     ==========UPCOMING VISITS========== 10/10/2023 [facility] RAD ONCOLOGY FOLLOW UP 30 Glenis Langdon, MD  10/03/2023 [facility] MED ONC PORT FLUSH W/LAB [facility]-PORT FLUSH  10/03/2023 [facility] MED ONC EST PT 15 Avonne Boettcher, MD  09/19/2023 [facility] FOLLOW UP 20 Renaldo Caroli, MD  09/19/2023 [name]CT IMAGING CT CHEST WO CONTRAST [name]        ==========APPENDIX - ON TREATMENT VISIT NOTES==========   See weekly On Treatment Notes in Epic for details in the Media tab (listed as Progress notes on the On Treatment Visit Dates listed above).

## 2023-09-13 DIAGNOSIS — Z86711 Personal history of pulmonary embolism: Secondary | ICD-10-CM | POA: Diagnosis not present

## 2023-09-13 DIAGNOSIS — Z7901 Long term (current) use of anticoagulants: Secondary | ICD-10-CM | POA: Diagnosis not present

## 2023-09-18 NOTE — Progress Notes (Unsigned)
 PROVIDER NOTE: Interpretation of information contained herein should be left to medically-trained personnel. Specific patient instructions are provided elsewhere under "Patient Instructions" section of medical record. This document was created in part using AI and STT-dictation technology, any transcriptional errors that may result from this process are unintentional.  Patient: Kara [name]  Service: E/M   PCP: Antonio Baumgarten, MD  DOB: 10-31-1950  DOS: 09/19/2023  Provider: Candi Chafe, MD  MRN: [id number]  Delivery: Face-to-face  Specialty: Interventional Pain Management  Type: Established Patient  Setting: Ambulatory outpatient facility  Specialty designation: 09  Referring Prov.: Antonio Baumgarten, MD  Location: Outpatient office facility       HPI  [name], a 73 y.o. year old adult, is here today because of her No primary diagnosis found.. [name] primary complain today is No chief complaint on file.  Pertinent problems: 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); Lumbar pars defect (L3 and L4) (Bilateral); Diabetic peripheral neuropathy (HCC); Chronic lower extremity pain (2ry area of Pain) (Bilateral); Chronic radicular pain of lower extremity; Osteoarthritis of hip (Right); Chronic low back pain (1ry area of Pain) (Bilateral) w/o sciatica; Spondylosis without myelopathy or radiculopathy, lumbosacral region; Other  specified dorsopathies, sacral and sacrococcygeal region; Secondary osteoarthritis of multiple sites; Sacral insufficiency fracture; DDD (degenerative disc disease), lumbosacral; Coccygodynia; Chronic sacroiliac joint pain (Left); Osteoarthritis of lumbar spine; Spondylolisthesis, lumbosacral region; Abnormal MRI, lumbar spine (08/09/2019); Spasm of muscle of lower back; Chronic hip pain (Bilateral) (R>L); Osteoarthritis of hips (Bilateral); Greater trochanteric bursitis (Right); Chronic hip pain (Right); Chronic low back pain (Right) w/o sciatica; Greater trochanteric bursitis (Left); Other bursitis of hip, not elsewhere classified (gluteus medius bursa) (Left); Lumbar facet joint pain; Piriformis muscle pain (Right); Abdominal pain; Chest pain in adult; Chest pain; Mediastinal adenopathy; Cancer of upper lobe of right lung (HCC); and Recurrent non-small cell lung cancer (NSCLC) (HCC) on their pertinent problem list. Pain Assessment: Severity of   is reported as a  /10. Location:    / . Onset:  . Quality:  . Timing:  . Modifying factor(s):  Aaron Aas Vitals:  vitals were not taken for this visit.  BMI: Estimated body mass index is 34.8 kg/m as calculated from the following:   Height as of 08/27/23: 5\' 3"  (1.6 m).   Weight as of 09/02/23: 196 lb 6.9 oz (89.1 kg). Last encounter: 08/29/2023. Last procedure: 06/21/2023.  Reason for encounter:  *** . ***  Discussed the use of AI scribe software for clinical note transcription with the patient, who gave verbal consent to proceed.  History of Present Illness           Pharmacotherapy Assessment  Analgesic: No chronic opioid analgesics therapy prescribed by our practice, 2ry to Medication Agreement Violation. Oxycodone  IR 10 mg, 1 tab PO qd,  from PCP.  (Getting oxycodone  from PCP in multiple occasions, according to PMP)(11/21/20; 12/26/20; 01/16/21; 02/13/21). Opioid analgesic pharmacotherapy D/C'ed on 03/03/2021. MME: 20 mg/day.   Monitoring: [place] PMP: PDMP reviewed  during this encounter.       Pharmacotherapy: No side-effects  or adverse reactions reported. Compliance: No problems identified. Effectiveness: Clinically acceptable.  No notes on file  No results found for: "CBDTHCR" No results found for: "D8THCCBX" No results found for: "D9THCCBX"  UDS:  Summary  Date Value Ref Range Status  11/12/2020 Note  Final    Comment:    ==================================================================== [employer] Select 13 (MW) ==================================================================== Test                             Result       Flag       Units  Drug Present and Declared for Prescription Verification   Norhydrocodone                 61           EXPECTED   ng/mg creat    Norhydrocodone is an expected metabolite of hydrocodone .    Tramadol                        >4202        EXPECTED   ng/mg creat   O-Desmethyltramadol            >4202        EXPECTED   ng/mg creat   N-Desmethyltramadol            1712         EXPECTED   ng/mg creat    Source of tramadol  is a prescription medication. O-desmethyltramadol    and N-desmethyltramadol are expected metabolites of tramadol .  Drug Absent but Declared for Prescription Verification   Hydrocodone                     Not Detected UNEXPECTED ng/mg creat    Hydrocodone  is almost always present in patients taking this drug    consistently. Absence of hydrocodone  could be due to lapse of time    since the last dose or unusual pharmacokinetics (rapid metabolism).  ==================================================================== Test                      Result    Flag   Units      Ref Range   Creatinine              119              mg/dL      >=16 ==================================================================== Declared Medications:  The flagging and interpretation on this report are based on the  following declared medications.  Unexpected results may arise from  inaccuracies in the declared  medications.   **Note: The testing scope of this panel includes these medications:   Hydrocodone   Tramadol    **Note: The testing scope of this panel does not include the  following reported medications:   Acetaminophen   Alendronate (Fosamax)  Aspirin   Calcium   Candesartan  Cholecalciferol   Cyanocobalamin   Cyclobenzaprine   Dulaglutide  Gabapentin   Glipizide   Meclizine  Multivitamin  Nystatin  Ondansetron   Ropinirole   Tolterodine  Warfarin ==================================================================== For clinical consultation, please call [phone]. ====================================================================       ROS  Constitutional: Denies any fever or chills Gastrointestinal: No reported hemesis, hematochezia, vomiting, or acute GI distress Musculoskeletal: Denies any acute onset joint swelling, redness, loss of ROM, or weakness Neurological: No reported episodes of acute onset apraxia, aphasia, dysarthria, agnosia, amnesia, paralysis, loss of coordination, or loss of consciousness  Medication Review  Dulaglutide, HYDROcodone -acetaminophen , Magnesium Oxide -  Mg Supplement, Vitamin B-12, azithromycin , bisacodyl , candesartan, cholecalciferol , cyclobenzaprine , dexamethasone , gabapentin , glipiZIDE , glucose blood, omeprazole , ondansetron , oxybutynin , potassium chloride  SA, prochlorperazine , rOPINIRole , sucralfate , and warfarin  History Review  Allergy: Ms. Lo is allergic to adhesive [tape], avapro  [irbesartan ], morphine  and codeine , other, and latex. Drug: Ms. Minarik  reports no history of drug use. Alcohol :  reports no history of alcohol  use. Tobacco:  reports that she has never smoked. She has been exposed to tobacco smoke. She has never used smokeless tobacco. Social: Ms. Roughton  reports that she has never smoked. She has been exposed to tobacco smoke. She has never used smokeless tobacco. She reports that she does not drink alcohol  and  does not use drugs. Medical:  has a past medical history of Anginal pain (HCC), Arthritis, CHF (congestive heart failure) (HCC), Chronic anticoagulation, Degenerative disc disease, lumbar, Diverticulitis (2018), Diverticulosis, DVT of lower extremity, bilateral (HCC) (2018), Essential hypertension, History of being hospitalized, History of hiatal hernia, Mediastinal adenopathy (04/2023), MRSA nasal colonization, Non-small cell lung cancer (HCC) (2022), Obesity (BMI 30-39.9), Osteoarthritis, Osteoporosis, PE (pulmonary thromboembolism) (HCC) (2018), Pneumonia, Small bowel obstruction (HCC) (03/20/2023), 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HCC), Type 2 diabetes mellitus with obesity (HCC), and Vertigo. Surgical: Ms. Yuhas  has a past surgical history that includes Knee surgery (Right); Carpal tunnel release (Bilateral); Colon resection sigmoid (N/A, 11/02/2016); Colostomy (N/A, 11/02/2016); Sigmoidoscopy (N/A, 11/13/2016); PULMONARY VENOGRAPHY (N/A, 11/19/2016); Colonoscopy with propofol  (N/A, 02/03/2017); Colon surgery (11/02/2016); IVC FILTER INSERTION (Right, 10/2016); Colostomy reversal (N/A, 02/24/2017); Appendectomy (N/A, 02/24/2017); Lysis of adhesion (N/A, 02/24/2017); laparoscopy (N/A, 02/24/2017); Ileo loop diversion (N/A, 02/24/2017); Sigmoidoscopy (N/A, 05/19/2017); Ileostomy closure (N/A, 06/01/2017); IVC FILTER REMOVAL (N/A, 08/09/2017); Sacroplasty (N/A, 11/08/2017); Cataract extraction w/PHACO (Left, 11/21/2018); Cataract extraction w/PHACO (Right, 12/12/2018); Video bronchoscopy with endobronchial navigation (N/A, 02/23/2021); Flexible bronchoscopy (N/A, 05/16/2023); Endobronchial ultrasound (N/A, 05/16/2023); and IR IMAGING GUIDED PORT INSERTION (06/14/2023). Family: family history includes Breast cancer in her mother; Cancer in her mother; Diabetes in her mother and sister; Heart disease in her father, mother, and sister.  Laboratory Chemistry Profile   Renal Lab Results  Component Value  Date   BUN 8 09/02/2023   CREATININE 0.55 09/02/2023   GFRAA >60 07/09/2019   GFRNONAA >60 09/02/2023    Hepatic Lab Results  Component Value Date   AST 22 09/02/2023   ALT 15 09/02/2023   ALBUMIN 3.0 (L) 09/02/2023   ALKPHOS 54 09/02/2023   LIPASE 28 07/06/2023    Electrolytes Lab Results  Component Value Date   NA 136 09/02/2023   K 3.3 (L) 09/02/2023   CL 107 09/02/2023   CALCIUM  7.9 (L) 09/02/2023   MG 2.0 08/30/2023   PHOS 4.0 03/22/2023    Bone Lab Results  Component Value Date   25OHVITD1 25 (L) 11/02/2017   25OHVITD2 <1.0 11/02/2017   25OHVITD3 25 11/02/2017    Inflammation (CRP: Acute Phase) (ESR: Chronic Phase) Lab Results  Component Value Date   CRP 1.7 (H) 08/15/2020   ESRSEDRATE 12 11/02/2017   LATICACIDVEN 1.3 08/27/2023         Note: Above Lab results reviewed.  Recent Imaging Review  DG Chest 2 View CLINICAL DATA:  Sepsis.  Non-small-cell lung cancer.  EXAM: CHEST - 2 VIEW  COMPARISON:  05/16/2023  FINDINGS: Similar appearance of bandlike opacity in the right mid lung characterized as hypermetabolic on PET-CT 06/03/2023. There is some overlying pleural thickening, stable in the interval. Right base atelectasis with tiny right pleural effusion similar to prior. Left lung remains clear. Cardiopericardial  silhouette is at upper limits of normal for size. Right Port-A-Cath is new in the interval with tip overlying the mid SVC level.  IMPRESSION: 1. Similar appearance of bandlike opacity in the right mid lung characterized as hypermetabolic on PET-CT 06/03/2023 and compatible with the patient's known lung lesion. 2. Right base atelectasis with tiny right pleural effusion, similar to prior.  Electronically Signed   By: Donnal Fusi M.D.   On: 08/27/2023 13:13 Note: Reviewed        Physical Exam  General appearance: Well nourished, well developed, and well hydrated. In no apparent acute distress Mental status: Alert, oriented x 3  (person, place, & time)       Respiratory: No evidence of acute respiratory distress Eyes: PERLA Vitals: There were no vitals taken for this visit. BMI: Estimated body mass index is 34.8 kg/m as calculated from the following:   Height as of 08/27/23: 5\' 3"  (1.6 m).   Weight as of 09/02/23: 196 lb 6.9 oz (89.1 kg). Ideal: Patient weight not recorded  Assessment   Diagnosis Status  No diagnosis found. Controlled Controlled Controlled   Updated Problems: No problems updated.  Plan of Care  Problem-specific:  Assessment and Plan            Ms. Henrietta Collingwood Carino has a current medication list which includes the following long-term medication(s): candesartan, glipizide , omeprazole , potassium chloride  sa, prochlorperazine , ropinirole , and sucralfate .  Pharmacotherapy (Medications Ordered): No orders of the defined types were placed in this encounter.  Orders:  No orders of the defined types were placed in this encounter.  Follow-up plan:   No follow-ups on file.     Interventional Therapies  Risk  Complexity Considerations:   ANTICOAGULATION: Coumadin  (Stop: 5 days  Re-start: 2hrs)  Hx DVT & PE ALLERGY: Latex Morbid obesity (class III) (BMI>40)  metabolic syndrome  NIDDM  HTN Difficult Lumbar RFA due to morbid obesity    Planned  Pending: Palliative bilateral lumbar facet MBB [id number]    Under consideration:   Therapeutic bilateral lumbar facet RFA #2 (patient indicates preferring to have only the block without radiofrequency.)   Completed:   Therapeutic bilateral lumbar facet MBB [id number] (03/15/2023) (25/25/50/50)  Diagnostic/therapeutic right IA hip inj. x2 (01/28/2022) (100/100/85/85)  Diagnostic/therapeutic left IA hip inj. x1 (01/28/2022) (100/100/85/85)  Diagnostic/therapeutic right trochanteric bursa inj. x1 (09/15/2021) (85/85/85)  Diagnostic/therapeutic left trochanteric bursa inj. x1 (01/28/2022) (100/100/85/85)  Therapeutic left lumbar facet RFA x1  (07/21/2021) (100/100/100 x1 week/80) (Lasted 9 months)  Therapeutic right lumbar facet RFA x1 (07/07/2021) (100/100/80/80) (Lasted 9 months)  Palliative/Therapeutic left L4-5 LESI x1 (11/10/2017) (100/80/0/0-25)  Diagnostic/therapeutic right L4 TFESI x1 (11/19/2021) (90/80/0/0) (fell at [employer]) Palliative right SI joint block x9 (01/28/2022) (100/100/80/80)  Diagnostic left SI joint block x2 (01/28/2022) (100/100/80/80)  Palliative/Therapeutic (Midline) caudal ESI x3 (06/08/2018) (100/100/100 x 2-3 days/0)    Therapeutic  Palliative (PRN) options:   NO PRN procedures without F2F exam by Dr. Larwence Tu due to multifactorial etiology of chronic pain. No more procedures until after 08/14/2022.    Pharmacotherapy  Nonopioids transferred 03/27/2020: Calcium       Recent Visits Date Type Provider Dept  06/21/23 Procedure visit Renaldo Caroli, MD [name]  Showing recent visits within past 90 days and meeting all other requirements Future Appointments Date Type Provider Dept  09/19/23 Appointment Renaldo Caroli, MD [name]  Showing future appointments within next 90 days and meeting all other requirements  I discussed the assessment and treatment plan with  the patient. The patient was provided an opportunity to ask questions and all were answered. The patient agreed with the plan and demonstrated an understanding of the instructions.  Patient advised to call back or seek an in-person evaluation if the symptoms or condition worsens.  Duration of encounter: *** minutes.  Total time on encounter, as per AMA guidelines included both the face-to-face and non-face-to-face time personally spent by the physician and/or other qualified health care professional(s) on the day of the encounter (includes time in activities that require the physician or other qualified health care professional and does not include time in activities normally performed by clinical staff).  Physician's time may include the following activities when performed: Preparing to see the patient (e.g., pre-charting review of records, searching for previously ordered imaging, lab work, and nerve conduction tests) Review of prior analgesic pharmacotherapies. Reviewing PMP Interpreting ordered tests (e.g., lab work, imaging, nerve conduction tests) Performing post-procedure evaluations, including interpretation of diagnostic procedures Obtaining and/or reviewing separately obtained history Performing a medically appropriate examination and/or evaluation Counseling and educating the patient/family/caregiver Ordering medications, tests, or procedures Referring and communicating with other health care professionals (when not separately reported) Documenting clinical information in the electronic or other health record Independently interpreting results (not separately reported) and communicating results to the patient/ family/caregiver Care coordination (not separately reported)  Note by: Candi Chafe, MD (TTS and AI technology used. I apologize for any typographical errors that were not detected and corrected.) Date: 09/19/2023; Time: 1:12 PM

## 2023-09-19 ENCOUNTER — Ambulatory Visit: Attending: Pain Medicine | Admitting: Pain Medicine

## 2023-09-19 ENCOUNTER — Encounter: Payer: Self-pay | Admitting: Pain Medicine

## 2023-09-19 ENCOUNTER — Ambulatory Visit: Admission: RE | Admit: 2023-09-19 | Payer: PPO | Source: Ambulatory Visit

## 2023-09-19 VITALS — BP 110/59 | HR 110 | Temp 97.5°F | Resp 16 | Ht 63.0 in | Wt 191.0 lb

## 2023-09-19 DIAGNOSIS — Z86711 Personal history of pulmonary embolism: Secondary | ICD-10-CM | POA: Diagnosis not present

## 2023-09-19 DIAGNOSIS — M47816 Spondylosis without myelopathy or radiculopathy, lumbar region: Secondary | ICD-10-CM | POA: Diagnosis not present

## 2023-09-19 DIAGNOSIS — G8929 Other chronic pain: Secondary | ICD-10-CM | POA: Insufficient documentation

## 2023-09-19 DIAGNOSIS — Z09 Encounter for follow-up examination after completed treatment for conditions other than malignant neoplasm: Secondary | ICD-10-CM | POA: Diagnosis not present

## 2023-09-19 DIAGNOSIS — M545 Low back pain, unspecified: Secondary | ICD-10-CM | POA: Insufficient documentation

## 2023-09-19 DIAGNOSIS — M4310 Spondylolisthesis, site unspecified: Secondary | ICD-10-CM | POA: Insufficient documentation

## 2023-09-19 DIAGNOSIS — M4317 Spondylolisthesis, lumbosacral region: Secondary | ICD-10-CM

## 2023-09-19 DIAGNOSIS — Z7901 Long term (current) use of anticoagulants: Secondary | ICD-10-CM | POA: Insufficient documentation

## 2023-09-19 DIAGNOSIS — M4306 Spondylolysis, lumbar region: Secondary | ICD-10-CM | POA: Diagnosis not present

## 2023-09-19 DIAGNOSIS — M5459 Other low back pain: Secondary | ICD-10-CM | POA: Diagnosis not present

## 2023-09-19 NOTE — Patient Instructions (Addendum)
 Facet Joint Block The facet joints connect the bones of the spine (vertebrae). They let you bend, twist, and make other movements with your spine. They also keep you from bending too far, twisting too far, and making other extreme movements. A facet joint block is a procedure where a numbing medicine (local anesthesia) is injected into a facet joint. Many times, a medicine for inflammation (steroid) is also injected. A facet joint block may be done: To diagnose neck or back pain. If the pain gets better after a facet joint block, the pain is likely coming from the facet joint. If the pain does not get better, the pain is likely not coming from the facet joint. To treat neck or back pain caused by an inflamed facet joint. To help you with physical therapy or other rehab (rehabilitation) exercises. Tell a health care provider about: Any allergies you have. All medicines you are taking, including vitamins, herbs, eye drops, creams, and over-the-counter medicines. Any problems you or family members have had with anesthesia. Any bleeding problems you have. Any surgeries you have had. Any medical conditions you have or have had. Whether you are pregnant or may be pregnant. What are the risks? Your health care provider will talk with you about risks. These may include: Infection. Allergic reactions to medicines or dyes. Bleeding. Injury to a nerve near where the needle was put in (injection site). Pain at the injection site. Short-term weakness or numbness in areas near the nerves at the injection site. What happens before the procedure? When to stop eating and drinking Follow instructions from your health care provider about what you may eat and drink. Medicines Ask your health care provider about: Changing or stopping your regular medicines. These include any diabetes medicines or blood thinners you take. Taking medicines such as aspirin  and ibuprofen. These medicines can thin your blood. Do  not take these medicines unless your health care provider tells you to. Taking over-the-counter medicines, vitamins, herbs, and supplements. General instructions If you will be going home right after the procedure, plan to have a responsible adult: Take you home from the hospital or clinic. You will not be allowed to drive. Care for you for the time you are told. Ask your health care provider: How your injection site will be marked. What steps will be taken to help prevent infection. These may include washing skin with a soap that kills germs. What happens during the procedure?  An IV will be inserted into one of your veins. You will lie on your stomach on an X-ray table. You may be asked to lie in a different position if you will be getting an injection in your neck. Your injection site will be cleaned with a soap that kills germs and then covered with a germ-free (sterile) drape. A local anesthesia will be put in at the injection site. A type of X-ray machine (fluoroscopy) or CT scan will be used to help find your facet joint. A contrast dye may also be injected into your joint to help show if the needle is at the joint. When your provider knows the needle is at your joint, they will inject anesthesia and anti-inflammatory medicine as needed. The needle will be removed. Pressure will be applied to keep your injection site from bleeding. A bandage (dressing) will be placed over each injection site. The procedure may vary among health care providers and hospitals. What happens after the procedure? Your blood pressure, heart rate, breathing rate, and blood oxygen level  will be monitored until you leave the hospital or clinic. This information is not intended to replace advice given to you by your health care provider. Make sure you discuss any questions you have with your health care provider. Document Revised: 11/20/2021 Document Reviewed: 11/20/2021 Elsevier Patient Education  2024  [employer] ______________________________________________________________________    Procedure instructions  Stop blood-thinners  Do not eat or drink fluids (other than water) for 6 hours before your procedure  No water for 2 hours before your procedure  Take your blood pressure medicine with a sip of water  Arrive 30 minutes before your appointment  If sedation is planned, bring suitable driver. Teddie [place]av[place], [place], & public transportation are NOT APPROVED)  Carefully read the "Preparing for your procedure" detailed instructions  If you have questions call us  at [phone]  Procedure appointments are for procedures only. NO medication refills or new problem evaluations.   ______________________________________________________________________      ______________________________________________________________________    Preparing for your procedure  Appointments: If you think you may not be able to keep your appointment, call 24-48 hours in advance to cancel. We need time to make it available to others.  Procedure visits are for procedures only. During your procedure appointment there will be: NO Prescription Refills*. NO medication changes or discussions*. NO discussion of disability issues*. NO unrelated pain problem evaluations*. NO evaluations to order other pain procedures*. *These will be addressed at a separate and distinct evaluation encounter on the provider's evaluation schedule and not during procedure days.  Instructions: Food intake: Avoid eating anything solid for at least 8 hours prior to your procedure. Clear liquid intake: You may take clear liquids such as water up to 2 hours prior to your procedure. (No carbonated drinks. No soda.) Transportation: Unless otherwise stated by your physician, bring a driver. (Driver cannot be a Market researcher, Pharmacist, community, or any other form of public transportation.) Morning Medicines: Except for blood thinners, take all of your other  morning medications with a sip of water. Make sure to take your heart and blood pressure medicines. If your blood pressure's lower number is above 100, the case will be rescheduled. Blood thinners: Make sure to stop your blood thinners as instructed.  If you take a blood thinner, but were not instructed to stop it, call our office [phone] and ask to talk to a nurse. Not stopping a blood thinner prior to certain procedures could lead to serious complications. Diabetics on insulin : Notify the staff so that you can be scheduled 1st case in the morning. If your diabetes requires high dose insulin , take only  of your normal insulin  dose the morning of the procedure and notify the staff that you have done so. Preventing infections: Shower with an antibacterial soap the morning of your procedure.  Build-up your immune system: Take 1000 mg of Vitamin C with every meal (3 times a day) the day prior to your procedure. Antibiotics: Inform the nursing staff if you are taking any antibiotics or if you have any conditions that may require antibiotics prior to procedures. (Example: recent joint implants)   Pregnancy: If you are pregnant make sure to notify the nursing staff. Not doing so may result in injury to the fetus, including death.  Sickness: If you have a cold, fever, or any active infections, call and cancel or reschedule your procedure. Receiving steroids while having an infection may result in complications. Arrival: You must be in the facility at least 30 minutes prior to your scheduled procedure.  Tardiness: Your scheduled time is also the cutoff time. If you do not arrive at least 15 minutes prior to your procedure, you will be rescheduled.  Children: Do not bring any children with you. Make arrangements to keep them home. Dress appropriately: There is always a possibility that your clothing may get soiled. Avoid long dresses. Valuables: Do not bring any jewelry or valuables.  Reasons to call and  reschedule or cancel your procedure: (Following these recommendations will minimize the risk of a serious complication.) Surgeries: Avoid having procedures within 2 weeks of any surgery. (Avoid for 2 weeks before or after any surgery). Flu Shots: Avoid having procedures within 2 weeks of a flu shots or . (Avoid for 2 weeks before or after immunizations). Barium: Avoid having a procedure within 7-10 days after having had a radiological study involving the use of radiological contrast. (Myelograms, Barium swallow or enema study). Heart attacks: Avoid any elective procedures or surgeries for the initial 6 months after a "Myocardial Infarction" (Heart Attack). Blood thinners: It is imperative that you stop these medications before procedures. Let us  know if you if you take any blood thinner.  Infection: Avoid procedures during or within two weeks of an infection (including chest colds or gastrointestinal problems). Symptoms associated with infections include: Localized redness, fever, chills, night sweats or profuse sweating, burning sensation when voiding, cough, congestion, stuffiness, runny nose, sore throat, diarrhea, nausea, vomiting, cold or Flu symptoms, recent or current infections. It is specially important if the infection is over the area that we intend to treat. Heart and lung problems: Symptoms that may suggest an active cardiopulmonary problem include: cough, chest pain, breathing difficulties or shortness of breath, dizziness, ankle swelling, uncontrolled high or unusually low blood pressure, and/or palpitations. If you are experiencing any of these symptoms, cancel your procedure and contact your primary care physician for an evaluation.  Remember:  Regular Business hours are:  Monday to Thursday 8:00 AM to 4:00 PM  Provider's Schedule: Renaldo Caroli, MD:  Procedure days: Tuesday and Thursday 7:30 AM to 4:00 PM  Cephus Collin, MD:  Procedure days: Monday and Wednesday 7:30 AM to 4:00  PM Last  Updated: 05/03/2023 ______________________________________________________________________      ______________________________________________________________________    General Risks and Possible Complications  Patient Responsibilities: It is important that you read this as it is part of your informed consent. It is our duty to inform you of the risks and possible complications associated with treatments offered to you. It is your responsibility as a patient to read this and to ask questions about anything that is not clear or that you believe was not covered in this document.  Patient's Rights: You have the right to refuse treatment. You also have the right to change your mind, even after initially having agreed to have the treatment done. However, under this last option, if you wait until the last second to change your mind, you may be charged for the materials used up to that point.  Introduction: Medicine is not an Visual merchandiser. Everything in Medicine, including the lack of treatment(s), carries the potential for danger, harm, or loss (which is by definition: Risk). In Medicine, a complication is a secondary problem, condition, or disease that can aggravate an already existing one. All treatments carry the risk of possible complications. The fact that a side effects or complications occurs, does not imply that the treatment was conducted incorrectly. It must be clearly understood that these can happen even when everything is done following  the highest safety standards.  No treatment: You can choose not to proceed with the proposed treatment alternative. The "PRO(s)" would include: avoiding the risk of complications associated with the therapy. The "CON(s)" would include: not getting any of the treatment benefits. These benefits fall under one of three categories: diagnostic; therapeutic; and/or palliative. Diagnostic benefits include: getting information which can ultimately lead to  improvement of the disease or symptom(s). Therapeutic benefits are those associated with the successful treatment of the disease. Finally, palliative benefits are those related to the decrease of the primary symptoms, without necessarily curing the condition (example: decreasing the pain from a flare-up of a chronic condition, such as incurable terminal cancer).  General Risks and Complications: These are associated to most interventional treatments. They can occur alone, or in combination. They fall under one of the following six (6) categories: no benefit or worsening of symptoms; bleeding; infection; nerve damage; allergic reactions; and/or death. No benefits or worsening of symptoms: In Medicine there are no guarantees, only probabilities. No healthcare provider can ever guarantee that a medical treatment will work, they can only state the probability that it may. Furthermore, there is always the possibility that the condition may worsen, either directly, or indirectly, as a consequence of the treatment. Bleeding: This is more common if the patient is taking a blood thinner, either prescription or over the counter (example: Goody Powders, Fish oil, Aspirin , Garlic, etc.), or if suffering a condition associated with impaired coagulation (example: Hemophilia, cirrhosis of the liver, low platelet counts, etc.). However, even if you do not have one on these, it can still happen. If you have any of these conditions, or take one of these drugs, make sure to notify your treating physician. Infection: This is more common in patients with a compromised immune system, either due to disease (example: diabetes, cancer, human immunodeficiency virus [HIV], etc.), or due to medications or treatments (example: therapies used to treat cancer and rheumatological diseases). However, even if you do not have one on these, it can still happen. If you have any of these conditions, or take one of these drugs, make sure to notify  your treating physician. Nerve Damage: This is more common when the treatment is an invasive one, but it can also happen with the use of medications, such as those used in the treatment of cancer. The damage can occur to small secondary nerves, or to large primary ones, such as those in the spinal cord and brain. This damage may be temporary or permanent and it may lead to impairments that can range from temporary numbness to permanent paralysis and/or brain death. Allergic Reactions: Any time a substance or material comes in contact with our body, there is the possibility of an allergic reaction. These can range from a mild skin rash (contact dermatitis) to a severe systemic reaction (anaphylactic reaction), which can result in death. Death: In general, any medical intervention can result in death, most of the time due to an unforeseen complication. ______________________________________________________________________      ______________________________________________________________________    Blood Thinners  IMPORTANT NOTICE:  If you take any of these, make sure to notify the nursing staff.  Failure to do so may result in serious injury.  Recommended time intervals to stop and restart blood-thinners, before & after invasive procedures  Generic Name Brand Name Pre-procedure: Stop medication for this amount of time before your procedure: Post-procedure: Wait this amount of time after the procedure before restarting your medication:  Abciximab Reopro 15 days 2  hrs  Alteplase Activase 10 days 10 days  Anagrelide Agrylin    Apixaban  Eliquis  3 days 6 hrs  Cilostazol Pletal 3 days 5 hrs  Clopidogrel Plavix 7-10 days 2 hrs  Dabigatran Pradaxa 5 days 6 hrs  Dalteparin Fragmin 24 hours 4 hrs  Dipyridamole Aggrenox 11days 2 hrs  Edoxaban Lixiana; Savaysa 3 days 2 hrs  Enoxaparin   Lovenox  24 hours 4 hrs  Eptifibatide Integrillin 8 hours 2 hrs  Fondaparinux  Arixtra 72 hours 12 hrs   Hydroxychloroquine Plaquenil 11 days   Prasugrel Effient 7-10 days 6 hrs  Reteplase Retavase 10 days 10 days  Rivaroxaban  Xarelto  3 days 6 hrs  Ticagrelor Brilinta 5-7 days 6 hrs  Ticlopidine Ticlid 10-14 days 2 hrs  Tinzaparin Innohep 24 hours 4 hrs  Tirofiban Aggrastat 8 hours 2 hrs  Warfarin Coumadin  5 days 2 hrs   Other medications with blood-thinning effects  NOTE: Consider stopping these if you have prolonged bleeding despite not taking any of the above blood thinners. Otherwise ask your provider and this will be decided on a case-by-case basis.  Product indications Generic (Brand) names Note  Cholesterol Lipitor Stop 4 days before procedure  Blood thinner (injectable) Heparin  (LMW or LMWH Heparin ) Stop 24 hours before procedure  Cancer Ibrutinib (Imbruvica) Stop 7 days before procedure  Malaria/Rheumatoid Hydroxychloroquine (Plaquenil) Stop 11 days before procedure  Thrombolytics  10 days before or after procedures   Over-the-counter (OTC) Products with blood-thinning effects  NOTE: Consider stopping these if you have prolonged bleeding despite not taking any of the above blood thinners. Otherwise ask your provider and this will be decided on a case-by-case basis.  Product Common names Stop Time  Aspirin  > 325 mg Goody Powders, Excedrin, etc. 11 days  Aspirin  <= 81 mg  7 days  Fish oil  4 days  Garlic supplements  7 days  Ginkgo biloba  36 hours  Ginseng  24 hours  NSAIDs Ibuprofen, Naprosyn , etc. 3 days  Vitamin E  4 days   ______________________________________________________________________

## 2023-10-03 ENCOUNTER — Encounter: Payer: Self-pay | Admitting: Oncology

## 2023-10-03 ENCOUNTER — Telehealth: Payer: Self-pay | Admitting: Pharmacy Technician

## 2023-10-03 ENCOUNTER — Encounter: Payer: Self-pay | Admitting: Internal Medicine

## 2023-10-03 ENCOUNTER — Other Ambulatory Visit: Payer: Self-pay

## 2023-10-03 ENCOUNTER — Inpatient Hospital Stay

## 2023-10-03 ENCOUNTER — Inpatient Hospital Stay: Admitting: Pharmacist

## 2023-10-03 ENCOUNTER — Inpatient Hospital Stay: Payer: PPO | Attending: Oncology | Admitting: Oncology

## 2023-10-03 ENCOUNTER — Other Ambulatory Visit (HOSPITAL_COMMUNITY): Payer: Self-pay

## 2023-10-03 VITALS — BP 108/70 | HR 102 | Temp 97.3°F | Resp 16 | Wt 192.0 lb

## 2023-10-03 DIAGNOSIS — Z79899 Other long term (current) drug therapy: Secondary | ICD-10-CM | POA: Insufficient documentation

## 2023-10-03 DIAGNOSIS — Z95828 Presence of other vascular implants and grafts: Secondary | ICD-10-CM

## 2023-10-03 DIAGNOSIS — C3411 Malignant neoplasm of upper lobe, right bronchus or lung: Secondary | ICD-10-CM

## 2023-10-03 DIAGNOSIS — Z993 Dependence on wheelchair: Secondary | ICD-10-CM | POA: Insufficient documentation

## 2023-10-03 DIAGNOSIS — Z7189 Other specified counseling: Secondary | ICD-10-CM | POA: Diagnosis not present

## 2023-10-03 DIAGNOSIS — Z803 Family history of malignant neoplasm of breast: Secondary | ICD-10-CM | POA: Insufficient documentation

## 2023-10-03 LAB — CBC WITH DIFFERENTIAL (CANCER CENTER ONLY)
Abs Immature Granulocytes: 0.02 10*3/uL (ref 0.00–0.07)
Basophils Absolute: 0 10*3/uL (ref 0.0–0.1)
Basophils Relative: 1 %
Eosinophils Absolute: 0.1 10*3/uL (ref 0.0–0.5)
Eosinophils Relative: 2 %
HCT: 34.2 % — ABNORMAL LOW (ref 36.0–46.0)
Hemoglobin: 10.9 g/dL — ABNORMAL LOW (ref 12.0–15.0)
Immature Granulocytes: 1 %
Lymphocytes Relative: 19 %
Lymphs Abs: 0.8 10*3/uL (ref 0.7–4.0)
MCH: 31.2 pg (ref 26.0–34.0)
MCHC: 31.9 g/dL (ref 30.0–36.0)
MCV: 98 fL (ref 80.0–100.0)
Monocytes Absolute: 0.4 10*3/uL (ref 0.1–1.0)
Monocytes Relative: 10 %
Neutro Abs: 2.8 10*3/uL (ref 1.7–7.7)
Neutrophils Relative %: 67 %
Platelet Count: 169 10*3/uL (ref 150–400)
RBC: 3.49 MIL/uL — ABNORMAL LOW (ref 3.87–5.11)
RDW: 15.1 % (ref 11.5–15.5)
WBC Count: 4 10*3/uL (ref 4.0–10.5)
nRBC: 0 % (ref 0.0–0.2)

## 2023-10-03 LAB — CMP (CANCER CENTER ONLY)
ALT: 15 U/L (ref 0–44)
AST: 25 U/L (ref 15–41)
Albumin: 3.1 g/dL — ABNORMAL LOW (ref 3.5–5.0)
Alkaline Phosphatase: 45 U/L (ref 38–126)
Anion gap: 10 (ref 5–15)
BUN: 8 mg/dL (ref 8–23)
CO2: 22 mmol/L (ref 22–32)
Calcium: 8.6 mg/dL — ABNORMAL LOW (ref 8.9–10.3)
Chloride: 105 mmol/L (ref 98–111)
Creatinine: 0.64 mg/dL (ref 0.44–1.00)
GFR, Estimated: >60 mL/min (ref >60)
Glucose, Bld: 213 mg/dL — ABNORMAL HIGH (ref 70–99)
Potassium: 3.6 mmol/L (ref 3.5–5.1)
Sodium: 137 mmol/L (ref 135–145)
Total Bilirubin: 0.5 mg/dL (ref 0.0–1.2)
Total Protein: 6.4 g/dL — ABNORMAL LOW (ref 6.5–8.1)

## 2023-10-03 MED ORDER — SODIUM CHLORIDE 0.9% FLUSH
10.0000 mL | Freq: Once | INTRAVENOUS | Status: AC
  Administered 2023-10-03: 10 mL via INTRAVENOUS
  Filled 2023-10-03: qty 10

## 2023-10-03 MED ORDER — OSIMERTINIB MESYLATE 80 MG PO TABS: 80.0000 mg | ORAL_TABLET | Freq: Every day | ORAL | Status: DC

## 2023-10-03 MED ORDER — HEPARIN SOD (PORK) LOCK FLUSH 100 UNIT/ML IV SOLN
500.0000 [IU] | Freq: Once | INTRAVENOUS | Status: AC
Start: 2023-10-03 — End: 2023-10-03
  Administered 2023-10-03: 500 [IU] via INTRAVENOUS
  Filled 2023-10-03: qty 5

## 2023-10-03 NOTE — Progress Notes (Signed)
 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 She was in remission until November 2024 when she was noted to have hypermetabolic in the right hilum as well as mediastinal region. 15 x 12 mm nodular opacity in the dome of the right diaphragm. 10 mm left cervical lymph node. 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 Right pleural thoracentesis negative for malignancy. Plan is for concurrent chemoradiation with weekly CarboTaxol.    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  MSI not high.  CT DNA tumor fraction 7%  Interval history-patient was supposed to see me to discuss CT scan results but she canceled her CT and therefore it was not done.  Overall she feels better today.  She still has baseline fatigue which has not improved altogether  ECOG PS- 1 Pain scale- 3 Opioid associated constipation- no  Review of systems- Review of Systems  Constitutional:  Positive for malaise/fatigue. Negative for chills, fever and weight loss.  HENT:  Negative for congestion, ear discharge and nosebleeds.   Eyes:  Negative for blurred vision.  Respiratory:  Negative for cough, hemoptysis, sputum production, shortness of  breath and wheezing.   Cardiovascular:  Negative for chest pain, palpitations, orthopnea and claudication.  Gastrointestinal:  Negative for abdominal pain, blood in stool, constipation, diarrhea, heartburn, melena, nausea and vomiting.  Genitourinary:  Negative for dysuria, flank pain, frequency, hematuria and urgency.  Musculoskeletal:  Negative for back pain, joint pain and myalgias.  Skin:  Negative for rash.  Neurological:  Negative for dizziness, tingling, focal weakness, seizures, weakness and headaches.  Endo/Heme/Allergies:  Does not bruise/bleed easily.  Psychiatric/Behavioral:  Negative for depression and suicidal ideas. The patient does not have insomnia.       Allergies  Allergen Reactions   Adhesive [Tape] Other (See Comments)    Pt reports, when removing tape from skin most tapes pull her skin off. Ok to use paper tape   Avapro  [Irbesartan ] Anaphylaxis    Facial swelling   Morphine  And Codeine  Nausea And Vomiting   Other Hives    Chlorhexadine wipes/CHG wipes,    Latex Rash    Exam gloves/ does not react around elastic and lips don't swell when blowing balloons     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, 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   CHF (congestive heart failure) (HCC)    followed colon resection, "wouldn't let me get up"   Chronic anticoagulation    Degenerative disc disease, lumbar    Diverticulitis 2018   Diverticulosis    DVT of lower extremity, bilateral (HCC) 2018   Essential hypertension    History of being hospitalized    1 month ago  for 3 days, vomiting, and kink in upper intestine   History of hiatal hernia    Mediastinal adenopathy 04/2023   MRSA nasal colonization    Non-small cell lung cancer (HCC) 2022   right   Obesity (BMI 30-39.9)    Osteoarthritis    Osteoporosis    PE (pulmonary thromboembolism) (HCC) 2018   Pneumonia    Small bowel obstruction (HCC) 03/20/2023    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HCC)    Type 2 diabetes mellitus with obesity (HCC)    Vertigo      Past Surgical History:  Procedure Laterality Date   APPENDECTOMY N/A 02/24/2017   Procedure: Incidental  APPENDECTOMY;  Surgeon: Gwyndolyn Lerner, MD;  Location: [name];  Service: General;  Laterality: N/A;   CARPAL TUNNEL RELEASE Bilateral    CATARACT EXTRACTION W/PHACO Left 11/21/2018   Procedure: CATARACT EXTRACTION PHACO AND INTRAOCULAR LENS PLACEMENT (IOC) LEFT DIABETES;  Surgeon: Clair Crews, MD;  Location: [facility];  Service: Ophthalmology;  Laterality: Left;  latex sensitivity Diabetic - oral meds   CATARACT EXTRACTION W/PHACO Right 12/12/2018   Procedure: CATARACT EXTRACTION PHACO AND INTRAOCULAR LENS PLACEMENT (IOC) RIGHT DIABETES;  Surgeon: Clair Crews, MD;  Location: [facility];  Service: Ophthalmology;  Laterality: Right;  Diabetes-oral med Latex sensitiviy   COLON RESECTION SIGMOID N/A 11/02/2016   Procedure: COLON RESECTION SIGMOID;  Surgeon: Gwyndolyn Lerner, MD;  Location: [name];  Service: General;  Laterality: N/A;   COLON SURGERY  11/02/2016   COLONOSCOPY WITH PROPOFOL  N/A 02/03/2017   Procedure: COLONOSCOPY WITH PROPOFOL ;  Surgeon: Selena Daily, MD;  Location: [name];  Service: Gastroenterology;  Laterality: N/A;   COLOSTOMY N/A 11/02/2016   Procedure: COLOSTOMY;  Surgeon: Gwyndolyn Lerner, MD;  Location: [name];  Service: General;  Laterality: N/A;   COLOSTOMY REVERSAL N/A 02/24/2017   Procedure: COLOSTOMY REVERSAL, ostomy takedown, spleenic flexure mobilization, excision rectal stump/distal sigmoid, anastomosis with suture reinforcement;  Surgeon: Gwyndolyn Lerner, MD;  Location: [name];  Service: General;  Laterality: N/A;   ENDOBRONCHIAL ULTRASOUND N/A 05/16/2023   Procedure: ENDOBRONCHIAL ULTRASOUND;  Surgeon: Marc Senior, MD;  Location: [name];  Service: Cardiopulmonary;  Laterality: N/A;   FLEXIBLE  BRONCHOSCOPY N/A 05/16/2023   Procedure: FLEXIBLE BRONCHOSCOPY;  Surgeon: Marc Senior, MD;  Location: [name];  Service: Cardiopulmonary;  Laterality: N/A;   ILEO LOOP DIVERSION N/A 02/24/2017   Procedure: ILEO LOOP COLOSTOMY;  Surgeon: Gwyndolyn Lerner, MD;  Location: [name];  Service: General;  Laterality: N/A;   ILEOSTOMY CLOSURE N/A 06/01/2017   Procedure: LOOP ILEOSTOMY TAKEDOWN;  Surgeon: Gwyndolyn Lerner, MD;  Location: [name];  Service: General;  Laterality: N/A;   IR IMAGING GUIDED PORT INSERTION  06/14/2023   IVC FILTER INSERTION Right 10/2016   IVC FILTER REMOVAL N/A 08/09/2017   Procedure: IVC FILTER REMOVAL;  Surgeon: Jackquelyn Mass, MD;  Location: [name];  Service: Cardiovascular;  Laterality: N/A;   KNEE SURGERY Right    torn menicus   LAPAROSCOPY N/A 02/24/2017   Procedure: LAPAROSCOPY DIAGNOSTIC;  Surgeon: Gwyndolyn Lerner, MD;  Location: [name];  Service: General;  Laterality: N/A;   LYSIS OF ADHESION N/A 02/24/2017   Procedure: LYSIS OF ADHESION;  Surgeon: Gwyndolyn Lerner, MD;  Location: [name];  Service: General;  Laterality: N/A;   PULMONARY VENOGRAPHY N/A 11/19/2016   Procedure: Pulmonary Venography; IVC filter placement; possible pulmonary thrombectomy;  Surgeon: Jackquelyn Mass, MD;  Location: [name];  Service: Cardiovascular;  Laterality: N/A;   SACROPLASTY N/A 11/08/2017   Procedure: SACROPLASTY S2;  Surgeon: Molli Angelucci, MD;  Location: [name];  Service: Orthopedics;  Laterality: N/A;   SIGMOIDOSCOPY N/A 11/13/2016   Procedure: endoscopic  flexible SIGMOIDOSCOPY;  Surgeon: Claudia Cuff, MD;  Location: [name];  Service: General;  Laterality: N/A;   SIGMOIDOSCOPY N/A 05/19/2017   Procedure: Marlynn Singer;  Surgeon: Gwyndolyn Lerner, MD;  Location: [name];  Service: General;  Laterality: N/A;   VIDEO BRONCHOSCOPY WITH ENDOBRONCHIAL NAVIGATION N/A 02/23/2021   Procedure: ROBOTIC VIDEO BRONCHOSCOPY  WITH ENDOBRONCHIAL NAVIGATION;  Surgeon: Marc Senior, MD;  Location: [name];  Service: Pulmonary;  Laterality: N/A;    Social History   Socioeconomic History   Marital status: Married    Spouse name: Vallorie Gayer   Number of children: 1   Years of education: Not on file   Highest education level: Not on file  Occupational History   Not on file  Tobacco Use   Smoking status: Never    Passive exposure: Current (husband smokes)   Smokeless tobacco: Never  Vaping Use   Vaping status: Never Used  Substance and Sexual Activity   Alcohol  use: No    Alcohol /week: 0.0 standard drinks of alcohol    Drug use: No   Sexual activity: Never  Other Topics Concern   Not on file  Social History Narrative   Not on file   Social Drivers of Health   Financial Resource Strain: Low Risk  (05/19/2023)   Received from [facility]   Overall Financial Resource Strain (CARDIA)    Difficulty of Paying Living Expenses: Not hard at all  Food Insecurity: No Food Insecurity (05/19/2023)   [facility]   Hunger Vital Sign    Worried About Running Out of Food in the Last Year: Never true    Ran Out of Food in the Last Year: Never true  Transportation Needs: No Transportation Needs (05/19/2023)   Received from [facility] - Transportation    In the past 12 months, has lack of transportation kept you from medical appointments or from getting medications?: No    Lack of Transportation (Non-Medical): No  Physical Activity: Not on file  Stress: Not on file  Social Connections: Not on file  Intimate Partner Violence: Not At Risk (05/09/2023)   Humiliation, Afraid, Rape, and Kick questionnaire    Fear of Current or Ex-Partner: No    Emotionally Abused: No    Physically Abused: No    Sexually Abused: No    Family History  Problem Relation Age of Onset   Diabetes Mother    Heart disease Mother    Cancer Mother    Breast  cancer Mother        >50   Heart disease Father    Diabetes Sister    Heart disease Sister      Current Outpatient Medications:    bisacodyl  (DULCOLAX) 10 MG suppository, Place 1 suppository (10 mg total) rectally as needed for moderate constipation., Disp: 12 suppository, Rfl: 0   candesartan (ATACAND) 16 MG tablet, Take 16 mg by mouth in the morning., Disp: , Rfl:    cholecalciferol  (VITAMIN D ) 25 MCG (1000 UNIT) tablet, Take 1,000 Units by mouth in the morning., Disp: , Rfl:    Cyanocobalamin  (VITAMIN B-12) 5000 MCG TBDP, Take 5,000 mcg by mouth in the morning., Disp: , Rfl:    cyclobenzaprine  (FLEXERIL ) 10 MG tablet,  Take 10 mg by mouth at bedtime., Disp: , Rfl:    Dulaglutide (TRULICITY) 1.5 MG/0.5ML SOAJ, Inject 1.5 mg into the skin once a week. Saturday, Disp: , Rfl:    gabapentin  (NEURONTIN ) 100 MG capsule, Take 200 mg by mouth with breakfast, with lunch, and with evening meal., Disp: , Rfl:    gabapentin  (NEURONTIN ) 300 MG capsule, Take 300 mg by mouth at bedtime., Disp: , Rfl:    glipiZIDE  (GLUCOTROL ) 10 MG tablet, Take 10 mg by mouth daily before breakfast., Disp: , Rfl:    HYDROcodone -acetaminophen  (NORCO/VICODIN) 5-325 MG tablet, Take 1 tablet by mouth every 6 (six) hours as needed for moderate pain., Disp: , Rfl:    Magnesium Oxide -Mg Supplement 500 MG TABS, Take 1 tablet by mouth at bedtime., Disp: , Rfl:    omeprazole  (PRILOSEC) 20 MG capsule, Take 1 capsule (20 mg total) by mouth daily., Disp: 90 capsule, Rfl: 0   ondansetron  (ZOFRAN ) 8 MG tablet, TAKE 1 TABLET BY MOUTH EVERY 8 HOURS AS NEEDED FOR NAUSEA OR VOMITING. START ON THE 3RD DAY AFTER CHEMOTHERAPY, Disp: 30 tablet, Rfl: 1   ONETOUCH ULTRA TEST test strip, , Disp: , Rfl:    osimertinib mesylate (TAGRISSO) 80 MG tablet, Take 1 tablet (80 mg total) by mouth daily., Disp: , Rfl:    oxybutynin  (DITROPAN ) 5 MG tablet, Take 1 tablet by mouth 2 (two) times daily., Disp: , Rfl:    potassium chloride  SA (KLOR-CON  M) 20 MEQ  tablet, Take 1 tablet (20 mEq total) by mouth daily., Disp: 30 tablet, Rfl: 1   prochlorperazine  (COMPAZINE ) 10 MG tablet, Take 1 tablet (10 mg total) by mouth every 6 (six) hours as needed for nausea or vomiting., Disp: 90 tablet, Rfl: 0   rOPINIRole  (REQUIP ) 1 MG tablet, Take 1 mg by mouth at bedtime., Disp: , Rfl:    sucralfate  (CARAFATE ) 1 g tablet, Take 1 tablet (1 g total) by mouth 3 (three) times daily with meals. Dissolve tablet in 4 tablesppons warm water swish and swallow 30 min before meals. (Patient taking differently: Take 1 g by mouth 3 (three) times daily with meals. Dissolve tablet in 4 tablespoons warm water swish and swallow 30 min before meals.), Disp: 90 tablet, Rfl: 0   warfarin (COUMADIN ) 6 MG tablet, Take 6 mg by mouth at bedtime., Disp: , Rfl:   Physical exam:  Vitals:   10/03/23 1009  BP: 108/70  Pulse: (!) 102  Resp: 16  Temp: (!) 97.3 F (36.3 C)  TempSrc: Tympanic  SpO2: 98%  Weight: 192 lb (87.1 kg)   Physical Exam Constitutional:      Comments: Sitting in a wheelchair and appears in no acute distress  Cardiovascular:     Rate and Rhythm: Normal rate and regular rhythm.     Heart sounds: Normal heart sounds.  Pulmonary:     Effort: Pulmonary effort is normal.     Breath sounds: Normal breath sounds.  Abdominal:     General: Bowel sounds are normal.     Palpations: Abdomen is soft.  Skin:    General: Skin is warm and dry.  Neurological:     Mental Status: She is alert and oriented to person, place, and time.     I have personally reviewed labs listed below:    Latest Ref Rng & Units 10/03/2023    9:59 AM  CMP  Glucose 70 - 99 mg/dL 409   BUN 8 - 23 mg/dL 8   Creatinine 8.11 -  1.00 mg/dL 2.54   Sodium 270 - 623 mmol/L 137   Potassium 3.5 - 5.1 mmol/L 3.6   Chloride 98 - 111 mmol/L 105   CO2 22 - 32 mmol/L 22   Calcium  8.9 - 10.3 mg/dL 8.6   Total Protein 6.5 - 8.1 g/dL 6.4   Total Bilirubin 0.0 - 1.2 mg/dL 0.5   Alkaline Phos 38 - 126 U/L  45   AST 15 - 41 U/L 25   ALT 0 - 44 U/L 15       Latest Ref Rng & Units 10/03/2023    9:59 AM  CBC  WBC 4.0 - 10.5 K/uL 4.0   Hemoglobin 12.0 - 15.0 g/dL 76.2   Hematocrit 83.1 - 46.0 % 34.2   Platelets 150 - 400 K/uL 169      Assessment and plan- Patient is a 73 y.o. adult with history of stage I right upper lobe lung cancer s/p SBRT in 2022 now with biopsy-proven mediastinal recurrence consistent with adenocarcinoma.  She is s/p concurrent chemoradiation with weekly CarboTaxol chemotherapy and here for routine follow-up  Patient was supposed to see me after getting repeat CT scans done but she canceled those CT scans and we will be rescheduling it for 10/06/2023.  Patient was noted to have EGFR mutation that was actionable based on her liquid biopsy.  If CT scans confirm partial response to treatment my plan is to proceed with Tagrisso 80 mg until progression or toxicity.  Discussed risks and benefits of Tagrisso including all but not limited to skin rash diarrhea, potential risk of pneumonitis, heart failure low blood counts nausea vomiting diarrhea.  Treatment will be given with curative intent.  Patient understands and agrees to proceed as planned.  Will work on Clinical biochemist for the same.  I will tentatively see her back in 3 weeks to discuss CT scan results and start Tagrisso at that time.  I will plan to get echocardiogram prior.  Baseline EKG was normal a month ago.  Labs today shows improvement in her white count hemoglobin after completing chemotherapy.  CMP is within normal limits as well.   Visit Diagnosis 1. Goals of care, counseling/discussion   2. Cancer of upper lobe of right lung [facility])      Dr. Seretha Dance, MD, MPH [facility] [phone] 10/03/2023 3:15 PM

## 2023-10-03 NOTE — Telephone Encounter (Signed)
 Oral Oncology Patient Advocate Encounter   Received notification that prior authorization for Tagrisso is required.   PA submitted on 10/03/2023 Key [id number] Status is pending     [name] Benjaman Branch, CPhT Oncology Pharmacy Patient Advocate [facility] Direct Number: [phone] Fax: [phone]

## 2023-10-03 NOTE — Telephone Encounter (Signed)
 Oral Oncology Patient Advocate Encounter   Submitted application for assistance for Tagrisso to [place].   Application submitted via e-fax to [phone]   [place] phone number [phone].   I will continue to check the status until final determination.   [name] Almedia Balls, CPhT Oncology Pharmacy Patient Advocate [facility] Direct Number: [phone] Fax: [phone]

## 2023-10-03 NOTE — Telephone Encounter (Signed)
 Oral Oncology Patient Advocate Encounter   Began application for assistance for Tagrisso through [place].   Application will be submitted upon completion of necessary supporting documentation.   [place] phone number [phone].   [name] Benjaman Branch, CPhT Oncology Pharmacy Patient Advocate [facility] Direct Number: [phone] Fax: [phone]

## 2023-10-03 NOTE — Telephone Encounter (Signed)
 Oral Oncology Patient Advocate Encounter  Prior Authorization for Tagrisso has been approved.    PA# [id number] Effective dates: 10/03/2023 through 10/02/2024  Patients co-pay is $1,325.75.    [name] Benjaman Branch, CPhT Oncology Pharmacy Patient Advocate [facility] Direct Number: [phone] Fax: [phone]

## 2023-10-03 NOTE — Progress Notes (Signed)
 Clinical Pharmacist Practitioner [facility] [address]  [phone] [phone]  Patient Care Team: Antonio Baumgarten, MD as PCP - General (Internal Medicine) Drake Gens, RN as Oncology Nurse Navigator Avonne Boettcher, MD as Consulting Physician (Oncology)   Name of the patient: Kara [name]  [id number]  24-May-1951   Date of visit: 10/03/23  HPI: Patient is a 73 y.o. 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 Patient has completed chemoradiation and the plan is to now proceed with osimertinib therapy until disease progression or unacceptable drug toxicity. Planned start on 10/31/23.  Reason for Consult: Osimertinib oral chemotherapy education.   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, 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   CHF (congestive heart failure) (HCC)    followed colon resection, "wouldn't let me get up"   Chronic anticoagulation    Degenerative disc disease, lumbar    Diverticulitis 2018   Diverticulosis    DVT of lower extremity, bilateral (HCC) 2018   Essential hypertension    History of being hospitalized    1 month ago for 3 days, vomiting, and kink in upper intestine   History of hiatal hernia    Mediastinal adenopathy 04/2023   MRSA nasal colonization    Non-small cell lung cancer (HCC) 2022   right   Obesity (BMI 30-39.9)    Osteoarthritis    Osteoporosis    PE (pulmonary thromboembolism) (HCC) 2018   Pneumonia    Small bowel obstruction (HCC) 03/20/2023   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HCC)    Type 2 diabetes mellitus with obesity (HCC)    Vertigo     HEMATOLOGY/ONCOLOGY HISTORY:  Oncology History  Cancer of upper lobe of right lung (HCC)  05/27/2023 Initial Diagnosis   Cancer of upper lobe of right lung (HCC)   07/01/2023 -  Chemotherapy   Patient is on Treatment Plan : LUNG Carboplatin  + Paclitaxel  +  XRT q7d       ALLERGIES:  is allergic to adhesive [tape], avapro  [irbesartan ], morphine  and codeine , other, and latex.  MEDICATIONS:  Current Outpatient Medications  Medication Sig Dispense Refill   bisacodyl  (DULCOLAX) 10 MG suppository Place 1 suppository (10 mg total) rectally as needed for moderate constipation. 12 suppository 0   candesartan (ATACAND) 16 MG tablet Take 16 mg by mouth in the morning.     cholecalciferol  (VITAMIN D ) 25 MCG (1000 UNIT) tablet Take 1,000 Units by mouth in the morning.     Cyanocobalamin  (VITAMIN B-12) 5000 MCG TBDP Take 5,000 mcg by mouth in the morning.     cyclobenzaprine  (FLEXERIL ) 10 MG tablet Take 10 mg by mouth at bedtime.     Dulaglutide (TRULICITY) 1.5 MG/0.5ML SOAJ Inject 1.5 mg into the skin once a week. Saturday     gabapentin  (NEURONTIN ) 100 MG capsule Take 200 mg by mouth with breakfast, with lunch, and with evening meal.     gabapentin  (NEURONTIN ) 300 MG capsule Take 300 mg by mouth at bedtime.     glipiZIDE  (GLUCOTROL ) 10 MG tablet Take 10 mg by mouth daily before breakfast.     HYDROcodone -acetaminophen  (NORCO/VICODIN) 5-325 MG tablet Take 1 tablet by mouth every 6 (six) hours as needed for moderate pain.     Magnesium Oxide -Mg Supplement 500 MG TABS Take 1 tablet by mouth at bedtime.     omeprazole  (PRILOSEC) 20 MG capsule  Take 1 capsule (20 mg total) by mouth daily. 90 capsule 0   ondansetron  (ZOFRAN ) 8 MG tablet TAKE 1 TABLET BY MOUTH EVERY 8 HOURS AS NEEDED FOR NAUSEA OR VOMITING. START ON THE 3RD DAY AFTER CHEMOTHERAPY 30 tablet 1   ONETOUCH ULTRA TEST test strip      oxybutynin  (DITROPAN ) 5 MG tablet Take 1 tablet by mouth 2 (two) times daily.     potassium chloride  SA (KLOR-CON  M) 20 MEQ tablet Take 1 tablet (20 mEq total) by mouth daily. 30 tablet 1   prochlorperazine  (COMPAZINE ) 10 MG tablet Take 1 tablet (10 mg total) by mouth every 6 (six) hours as needed for nausea or vomiting. 90 tablet 0   rOPINIRole  (REQUIP ) 1 MG tablet Take  1 mg by mouth at bedtime.     sucralfate  (CARAFATE ) 1 g tablet Take 1 tablet (1 g total) by mouth 3 (three) times daily with meals. Dissolve tablet in 4 tablesppons warm water swish and swallow 30 min before meals. (Patient taking differently: Take 1 g by mouth 3 (three) times daily with meals. Dissolve tablet in 4 tablespoons warm water swish and swallow 30 min before meals.) 90 tablet 0   warfarin (COUMADIN ) 6 MG tablet Take 6 mg by mouth at bedtime.     No current facility-administered medications for this visit.    VITAL SIGNS: There were no vitals taken for this visit. There were no vitals filed for this visit.  Estimated body mass index is 34.01 kg/m as calculated from the following:   Height as of 09/19/23: 5\' 3"  (1.6 m).   Weight as of an earlier encounter on 10/03/23: 87.1 kg (192 lb).  LABS: CBC:    Component Value Date/Time   WBC 4.0 10/03/2023 0959   WBC 2.4 (L) 08/30/2023 0936   HGB 10.9 (L) 10/03/2023 0959   HGB 13.9 05/30/2013 0520   HCT 34.2 (L) 10/03/2023 0959   HCT 40.8 05/30/2013 0520   PLT 169 10/03/2023 0959   PLT 181 05/30/2013 0520   MCV 98.0 10/03/2023 0959   MCV 92 05/30/2013 0520   NEUTROABS 2.8 10/03/2023 0959   NEUTROABS 4.1 05/30/2013 0520   LYMPHSABS 0.8 10/03/2023 0959   LYMPHSABS 0.9 (L) 05/30/2013 0520   MONOABS 0.4 10/03/2023 0959   MONOABS 0.4 05/30/2013 0520   EOSABS 0.1 10/03/2023 0959   EOSABS 0.0 05/30/2013 0520   BASOSABS 0.0 10/03/2023 0959   BASOSABS 0.0 05/30/2013 0520   Comprehensive Metabolic Panel:    Component Value Date/Time   NA 137 10/03/2023 0959   NA 139 05/30/2013 0520   K 3.6 10/03/2023 0959   K 3.6 05/30/2013 0520   CL 105 10/03/2023 0959   CL 108 (H) 05/30/2013 0520   CO2 22 10/03/2023 0959   CO2 27 05/30/2013 0520   BUN 8 10/03/2023 0959   BUN 11 05/30/2013 0520   CREATININE 0.64 10/03/2023 0959   CREATININE 0.90 05/30/2013 0520   GLUCOSE 213 (H) 10/03/2023 0959   GLUCOSE 98 05/30/2013 0520   CALCIUM  8.6  (L) 10/03/2023 0959   CALCIUM  7.9 (L) 05/30/2013 0520   AST 25 10/03/2023 0959   ALT 15 10/03/2023 0959   ALT 54 05/29/2013 0631   ALKPHOS 45 10/03/2023 0959   ALKPHOS 64 05/29/2013 0631   BILITOT 0.5 10/03/2023 0959   PROT 6.4 (L) 10/03/2023 0959   PROT 8.2 05/29/2013 0631   ALBUMIN 3.1 (L) 10/03/2023 0959   ALBUMIN 3.8 05/29/2013 0631     Present during  today's visit: patient only  Start plan: planned start on 10/31/23.   Patient Education I spoke with patient for overview of new oral chemotherapy medication: osimertinib  CMP from 10/03/23 assessed, no relevant lab abnormalities. Prescription dose and frequency assessed.    Administration: Counseled patient on administration, dosing, side effects, monitoring, drug-food interactions, safe handling, storage, and disposal. Patient will take 1 tablet (80 mg total) by mouth daily.   Side Effects: Side effects include but not limited to: rash, nail changes, diarrhea, mouth sores, decreased wbc/hgb/plt.    Drug-drug Interactions (DDI): No current relevant DDIs with osimertinib  Adherence: After discussion with patient no patient barriers to medication adherence identified.  Reviewed with patient importance of keeping a medication schedule and plan for any missed doses.  Ms. Bussinger voiced understanding and appreciation. All questions answered. Medication handout provided.  Provided patient with [facility] phone number. Patient knows to call the office with questions or concerns. [facility] will continue to follow.  Patient expressed understanding and was in agreement with this plan. She also understands that She can call clinic at any time with any questions, concerns, or complaints.   Medication Access Issues: PA pending   Follow-up plan: RTC as scheduled  Thank you for allowing me to participate in the care of this patient.   Time Total: 20 mins  Visit consisted of  counseling and education on dealing with issues of symptom management in the setting of serious and potentially life-threatening illness.Greater than 50%  of this time was spent counseling and coordinating care related to the above assessment and plan.  Signed by: Dewanna Hurston N. Hayze Gazda, PharmD, Lorraine Roses, CPP Hematology/Oncology Clinical Pharmacist Practitioner [facility] [phone]  10/03/2023 12:27 PM

## 2023-10-04 ENCOUNTER — Ambulatory Visit
Admission: RE | Admit: 2023-10-04 | Discharge: 2023-10-04 | Disposition: A | Discharge status: Home / Self Care | Source: Ambulatory Visit | Attending: Pain Medicine | Admitting: Pain Medicine

## 2023-10-04 ENCOUNTER — Other Ambulatory Visit: Payer: Self-pay

## 2023-10-04 ENCOUNTER — Ambulatory Visit: Attending: Pain Medicine | Admitting: Pain Medicine

## 2023-10-04 ENCOUNTER — Encounter: Payer: Self-pay | Admitting: Pain Medicine

## 2023-10-04 VITALS — BP 126/71 | HR 107 | Temp 97.7°F | Resp 19 | Ht 63.0 in | Wt 190.0 lb

## 2023-10-04 DIAGNOSIS — M545 Low back pain, unspecified: Secondary | ICD-10-CM | POA: Diagnosis not present

## 2023-10-04 DIAGNOSIS — M5459 Other low back pain: Secondary | ICD-10-CM | POA: Insufficient documentation

## 2023-10-04 DIAGNOSIS — M4306 Spondylolysis, lumbar region: Secondary | ICD-10-CM | POA: Diagnosis not present

## 2023-10-04 DIAGNOSIS — M47816 Spondylosis without myelopathy or radiculopathy, lumbar region: Secondary | ICD-10-CM | POA: Insufficient documentation

## 2023-10-04 DIAGNOSIS — G8929 Other chronic pain: Secondary | ICD-10-CM | POA: Diagnosis not present

## 2023-10-04 DIAGNOSIS — M4310 Spondylolisthesis, site unspecified: Secondary | ICD-10-CM | POA: Diagnosis not present

## 2023-10-04 DIAGNOSIS — Z7901 Long term (current) use of anticoagulants: Secondary | ICD-10-CM | POA: Diagnosis not present

## 2023-10-04 DIAGNOSIS — M47817 Spondylosis without myelopathy or radiculopathy, lumbosacral region: Secondary | ICD-10-CM | POA: Diagnosis not present

## 2023-10-04 MED ORDER — LIDOCAINE HCL 2 % IJ SOLN
20.0000 mL | Freq: Once | INTRAMUSCULAR | Status: AC
  Administered 2023-10-04: 400 mg

## 2023-10-04 MED ORDER — ROPIVACAINE HCL 2 MG/ML IJ SOLN
18.0000 mL | Freq: Once | INTRAMUSCULAR | Status: AC
  Administered 2023-10-04: 18 mL via PERINEURAL

## 2023-10-04 MED ORDER — MIDAZOLAM HCL 5 MG/5ML IJ SOLN
INTRAMUSCULAR | Status: AC
  Filled 2023-10-04: qty 5

## 2023-10-04 MED ORDER — FENTANYL CITRATE (PF) 100 MCG/2ML IJ SOLN
INTRAMUSCULAR | Status: AC
  Filled 2023-10-04: qty 2

## 2023-10-04 MED ORDER — LIDOCAINE HCL 2 % IJ SOLN
INTRAMUSCULAR | Status: AC
  Filled 2023-10-04: qty 20

## 2023-10-04 MED ORDER — TRIAMCINOLONE ACETONIDE 40 MG/ML IJ SUSP
80.0000 mg | Freq: Once | INTRAMUSCULAR | Status: AC
  Administered 2023-10-04: 80 mg

## 2023-10-04 MED ORDER — ROPIVACAINE HCL 2 MG/ML IJ SOLN
INTRAMUSCULAR | Status: AC
  Filled 2023-10-04: qty 20

## 2023-10-04 MED ORDER — MIDAZOLAM HCL 5 MG/5ML IJ SOLN
0.5000 mg | Freq: Once | INTRAMUSCULAR | Status: AC
  Administered 2023-10-04: 3 mg via INTRAVENOUS

## 2023-10-04 MED ORDER — PENTAFLUOROPROP-TETRAFLUOROETH EX AERO
INHALATION_SPRAY | Freq: Once | CUTANEOUS | Status: AC
Start: 2023-10-04 — End: 2023-10-04
  Administered 2023-10-04: 30 via TOPICAL

## 2023-10-04 MED ORDER — TRIAMCINOLONE ACETONIDE 40 MG/ML IJ SUSP
INTRAMUSCULAR | Status: AC
Start: 2023-10-04 — End: ?
  Filled 2023-10-04: qty 2

## 2023-10-04 MED ORDER — FENTANYL CITRATE (PF) 100 MCG/2ML IJ SOLN: 25.0000 ug | INTRAMUSCULAR | Status: DC | PRN

## 2023-10-04 NOTE — Progress Notes (Signed)
 Safety precautions to be maintained throughout the outpatient stay will include: orient to surroundings, keep bed in low position, maintain call bell within reach at all times, provide assistance with transfer out of bed and ambulation.

## 2023-10-04 NOTE — Progress Notes (Signed)
 PROVIDER NOTE: Interpretation of information contained herein should be left to medically-trained personnel. Specific patient instructions are provided elsewhere under "Patient Instructions" section of medical record. This document was created in part using STT-dictation technology, any transcriptional errors that may result from this process are unintentional.  Patient: Kara [name] Type: Established DOB: [birth year] MRN: [id number] PCP: Antonio Baumgarten, MD  Service: Procedure DOS: 10/04/2023 Setting: Ambulatory Location: Ambulatory outpatient facility Delivery: Face-to-face Provider: Candi Chafe, MD Specialty: Interventional Pain Management Specialty designation: 09 Location: Outpatient facility Ref. Prov.: Antonio Baumgarten, MD       Interventional Therapy   Type: Lumbar Facet, Medial Branch Block(s)   [id number]   Laterality: Bilateral  Level: L2, L3, L4, L5, and S1 Medial Branch Level(s). Injecting these levels blocks the L3-4, L4-5, and L5-S1 lumbar facet joints.  Imaging: Fluoroscopic guidance Spinal ([id number]) Anesthesia: Local anesthesia (1-2% Lidocaine ) Anxiolysis: IV Versed          Sedation: Moderate Sedation                       DOS: 10/04/2023 Performed by: Candi Chafe, MD  Primary Purpose: Diagnostic/Therapeutic Indications: Low back pain severe enough to impact quality of life or function. 1. Chronic low back pain (1ry area of Pain) (Bilateral) w/o sciatica   2. 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 Lumbar facet joint pain   4. Lumbar facet arthropathy (L3-4, L4-5, and L5-S1) (Bilateral)   5. Lumbar facet syndrome (Bilateral)   6. 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 Spondylosis without myelopathy or radiculopathy, lumbosacral region   8. Chronic anticoagulation (COUMADIN )    NAS-11 Pain score:   Pre-procedure: 8 /10   Post-procedure: 0-No pain/10     Position / Prep / Materials:  Position:  Prone  Prep solution: ChloraPrep (2% chlorhexidine  gluconate and 70% isopropyl alcohol ) Area Prepped: Posterolateral Lumbosacral Spine (Wide prep: From the lower border of the scapula down to the end of the tailbone and from flank to flank.)  Materials:  Tray: Block Needle(s):  Type: Spinal  Gauge (G): 22  Length: 5-in Qty: 4     H&P (Pre-op Assessment):  Kara [name] is a 73 y.o. (year old), adult patient, seen today for interventional treatment. She  has a past surgical history that includes Knee surgery (Right); Carpal tunnel release (Bilateral); Colon resection sigmoid (N/A, 11/02/2016); Colostomy (N/A, 11/02/2016); Sigmoidoscopy (N/A, 11/13/2016); PULMONARY VENOGRAPHY (N/A, 11/19/2016); Colonoscopy with propofol  (N/A, 02/03/2017); Colon surgery (11/02/2016); IVC FILTER INSERTION (Right, 10/2016); Colostomy reversal (N/A, 02/24/2017); Appendectomy (N/A, 02/24/2017); Lysis of adhesion (N/A, 02/24/2017); laparoscopy (N/A, 02/24/2017); Ileo loop diversion (N/A, 02/24/2017); Sigmoidoscopy (N/A, 05/19/2017); Ileostomy closure (N/A, 06/01/2017); IVC FILTER REMOVAL (N/A, 08/09/2017); Sacroplasty (N/A, 11/08/2017); Cataract extraction w/PHACO (Left, 11/21/2018); Cataract extraction w/PHACO (Right, 12/12/2018); Video bronchoscopy with endobronchial navigation (N/A, 02/23/2021); Flexible bronchoscopy (N/A, 05/16/2023); Endobronchial ultrasound (N/A, 05/16/2023); and IR IMAGING GUIDED PORT INSERTION (06/14/2023). Kara [name] has a current medication list which includes the following prescription(s): bisacodyl , candesartan, cholecalciferol , vitamin b-12, cyclobenzaprine , trulicity, gabapentin , gabapentin , glipizide , hydrocodone -acetaminophen , magnesium oxide -mg supplement, omeprazole , ondansetron , onetouch ultra test, osimertinib mesylate, oxybutynin , potassium chloride  sa, prochlorperazine , ropinirole , warfarin, and sucralfate , and the following Facility-Administered Medications: fentanyl . Her primarily  concern today is the Back Pain  Initial Vital Signs:  Pulse/HCG Rate: (!) 107ECG Heart Rate: (!) 105 (ST) Temp: 98 F (36.7 C) Resp: 18 BP: 133/74 SpO2: 93 %  BMI: Estimated body mass index is 33.66 kg/m  as calculated from the following:   Height as of this encounter: 5\' 3"  (1.6 m).   Weight as of this encounter: 190 lb (86.2 kg).  Risk Assessment: Allergies: Reviewed. She is allergic to adhesive [tape], avapro  [irbesartan ], morphine  and codeine , other, and latex.  Allergy Precautions: None required Coagulopathies: Reviewed. None identified.  Blood-thinner therapy: None at this time Active Infection(s): Reviewed. None identified. Kara [name] is afebrile  Site Confirmation: Kara [name] was asked to confirm the procedure and laterality before marking the site Procedure checklist: Completed Consent: Before the procedure and under the influence of no sedative(s), amnesic(s), or anxiolytics, the patient was informed of the treatment options, risks and possible complications. To fulfill our ethical and legal obligations, as recommended by the American Medical Association's Code of Ethics, I have informed the patient of my clinical impression; the nature and purpose of the treatment or procedure; the risks, benefits, and possible complications of the intervention; the alternatives, including doing nothing; the risk(s) and benefit(s) of the alternative treatment(s) or procedure(s); and the risk(s) and benefit(s) of doing nothing. The patient was provided information about the general risks and possible complications associated with the procedure. These may include, but are not limited to: failure to achieve desired goals, infection, bleeding, organ or nerve damage, allergic reactions, paralysis, and death. In addition, the patient was informed of those risks and complications associated to Spine-related procedures, such as failure to decrease pain; infection (i.e.: Meningitis, epidural or  intraspinal abscess); bleeding (i.e.: epidural hematoma, subarachnoid hemorrhage, or any other type of intraspinal or peri-dural bleeding); organ or nerve damage (i.e.: Any type of peripheral nerve, nerve root, or spinal cord injury) with subsequent damage to sensory, motor, and/or autonomic systems, resulting in permanent pain, numbness, and/or weakness of one or several areas of the body; allergic reactions; (i.e.: anaphylactic reaction); and/or death. Furthermore, the patient was informed of those risks and complications associated with the medications. These include, but are not limited to: allergic reactions (i.e.: anaphylactic or anaphylactoid reaction(s)); adrenal axis suppression; blood sugar elevation that in diabetics may result in ketoacidosis or comma; water retention that in patients with history of congestive heart failure may result in shortness of breath, pulmonary edema, and decompensation with resultant heart failure; weight gain; swelling or edema; medication-induced neural toxicity; particulate matter embolism and blood vessel occlusion with resultant organ, and/or nervous system infarction; and/or aseptic necrosis of one or more joints. Finally, the patient was informed that Medicine is not an exact science; therefore, there is also the possibility of unforeseen or unpredictable risks and/or possible complications that may result in a catastrophic outcome. The patient indicated having understood very clearly. We have given the patient no guarantees and we have made no promises. Enough time was given to the patient to ask questions, all of which were answered to the patient's satisfaction. Ms. Riddell has indicated that she wanted to continue with the procedure. Attestation: I, the ordering provider, attest that I have discussed with the patient the benefits, risks, side-effects, alternatives, likelihood of achieving goals, and potential problems during recovery for the procedure that I have  provided informed consent. Date  Time: 10/04/2023  8:54 AM  Pre-Procedure Preparation:  Monitoring: As per clinic protocol. Respiration, ETCO2, SpO2, BP, heart rate and rhythm monitor placed and checked for adequate function Safety Precautions: Patient was assessed for positional comfort and pressure points before starting the procedure. Time-out: I initiated and conducted the "Time-out" before starting the procedure, as per protocol. The patient was asked to participate  by confirming the accuracy of the "Time Out" information. Verification of the correct person, site, and procedure were performed and confirmed by me, the nursing staff, and the patient. "Time-out" conducted as per Joint Commission's Universal Protocol (UP.01.01.01). Time: 0923 Start Time: 0923 hrs.  Description of Procedure:          Laterality: (see above) Targeted Levels: (see above)  Safety Precautions: Aspiration looking for blood return was conducted prior to all injections. At no point did we inject any substances, as a needle was being advanced. Before injecting, the patient was told to immediately notify me if she was experiencing any new onset of "ringing in the ears, or metallic taste in the mouth". No attempts were made at seeking any paresthesias. Safe injection practices and needle disposal techniques used. Medications properly checked for expiration dates. SDV (single dose vial) medications used. After the completion of the procedure, all disposable equipment used was discarded in the proper designated medical waste containers. Local Anesthesia: Protocol guidelines were followed. The patient was positioned over the fluoroscopy table. The area was prepped in the usual manner. The time-out was completed. The target area was identified using fluoroscopy. A 12-in long, straight, sterile hemostat was used with fluoroscopic guidance to locate the targets for each level blocked. Once located, the skin was marked with an  approved surgical skin marker. Once all sites were marked, the skin (epidermis, dermis, and hypodermis), as well as deeper tissues (fat, connective tissue and muscle) were infiltrated with a small amount of a short-acting local anesthetic, loaded on a 10cc syringe with a 25G, 1.5-in  Needle. An appropriate amount of time was allowed for local anesthetics to take effect before proceeding to the next step. Local Anesthetic: Lidocaine  2.0% The unused portion of the local anesthetic was discarded in the proper designated containers. Technical description of process:  L2 Medial Branch Nerve Block (MBB): The target area for the L2 medial branch is at the junction of the postero-lateral aspect of the superior articular process and the superior, posterior, and medial edge of the transverse process of L3. Under fluoroscopic guidance, a Quincke needle was inserted until contact was made with os over the superior postero-lateral aspect of the pedicular shadow (target area). After negative aspiration for blood, 0.5 mL of the nerve block solution was injected without difficulty or complication. The needle was removed intact. L3 Medial Branch Nerve Block (MBB): The target area for the L3 medial branch is at the junction of the postero-lateral aspect of the superior articular process and the superior, posterior, and medial edge of the transverse process of L4. Under fluoroscopic guidance, a Quincke needle was inserted until contact was made with os over the superior postero-lateral aspect of the pedicular shadow (target area). After negative aspiration for blood, 0.5 mL of the nerve block solution was injected without difficulty or complication. The needle was removed intact. L4 Medial Branch Nerve Block (MBB): The target area for the L4 medial branch is at the junction of the postero-lateral aspect of the superior articular process and the superior, posterior, and medial edge of the transverse process of L5. Under  fluoroscopic guidance, a Quincke needle was inserted until contact was made with os over the superior postero-lateral aspect of the pedicular shadow (target area). After negative aspiration for blood, 0.5 mL of the nerve block solution was injected without difficulty or complication. The needle was removed intact. L5 Medial Branch Nerve Block (MBB): The target area for the L5 medial branch is at the junction  of the postero-lateral aspect of the superior articular process and the superior, posterior, and medial edge of the sacral ala. Under fluoroscopic guidance, a Quincke needle was inserted until contact was made with os over the superior postero-lateral aspect of the pedicular shadow (target area). After negative aspiration for blood, 0.5 mL of the nerve block solution was injected without difficulty or complication. The needle was removed intact. S1 Medial Branch Nerve Block (MBB): The target area for the S1 medial branch is at the posterior and inferior 6 o'clock position of the L5-S1 facet joint. Under fluoroscopic guidance, the Quincke needle inserted for the L5 MBB was redirected until contact was made with os over the inferior and postero aspect of the sacrum, at the 6 o' clock position under the L5-S1 facet joint (Target area). After negative aspiration for blood, 0.5 mL of the nerve block solution was injected without difficulty or complication. The needle was removed intact.  Once the entire procedure was completed, the treated area was cleaned, making sure to leave some of the prepping solution back to take advantage of its long term bactericidal properties.         Illustration of the posterior view of the lumbar spine and the posterior neural structures. Laminae of L2 through S1 are labeled. DPRL5, dorsal primary ramus of L5; DPRS1, dorsal primary ramus of S1; DPR3, dorsal primary ramus of L3; FJ, facet (zygapophyseal) joint L3-L4; I, inferior articular process of L4; LB1, lateral branch of  dorsal primary ramus of L1; IAB, inferior articular branches from L3 medial branch (supplies L4-L5 facet joint); IBP, intermediate branch plexus; MB3, medial branch of dorsal primary ramus of L3; NR3, third lumbar nerve root; S, superior articular process of L5; SAB, superior articular branches from L4 (supplies L4-5 facet joint also); TP3, transverse process of L3.   Facet Joint Innervation (* possible contribution)  L1-2 T12, L1 (L2*)  Medial Branch  L2-3 L1, L2 (L3*)         "          "  L3-4 L2, L3 (L4*)         "          "  L4-5 L3, L4 (L5*)         "          "  L5-S1 L4, L5, S1          "          "    Vitals:   10/04/23 0928 10/04/23 0932 10/04/23 0942 10/04/23 0952  BP: 130/85 131/89 131/69 103/61  Pulse:      Resp: 18 17 (!) 23   Temp:      SpO2: 99% 100% 95% 95%  Weight:      Height:         End Time: 0932 hrs.  Imaging Guidance (Spinal):          Type of Imaging Technique: Fluoroscopy Guidance (Spinal) Indication(s): Fluoroscopy guidance for needle placement to enhance accuracy in procedures requiring precise needle localization for targeted delivery of medication in or near specific anatomical locations not easily accessible without such real-time imaging assistance. Exposure Time: Please see nurses notes. Contrast: None used. Fluoroscopic Guidance: I was personally present during the use of fluoroscopy. "Tunnel Vision Technique" used to obtain the best possible view of the target area. Parallax error corrected before commencing the procedure. "Direction-depth-direction" technique used to introduce the needle under continuous pulsed fluoroscopy. Once target was reached, antero-posterior,  oblique, and lateral fluoroscopic projection used confirm needle placement in all planes. Images permanently stored in EMR. Interpretation: No contrast injected. I personally interpreted the imaging intraoperatively. Adequate needle placement confirmed in multiple planes. Permanent images  saved into the patient's record.  Post-operative Assessment:  Post-procedure Vital Signs:  Pulse/HCG Rate: (!) 107100 Temp: 98 F (36.7 C) Resp: (!) 23 BP: 103/61 SpO2: 95 %  EBL: None  Complications: No immediate post-treatment complications observed by team, or reported by patient.  Note: The patient tolerated the entire procedure well. A repeat set of vitals were taken after the procedure and the patient was kept under observation following institutional policy, for this type of procedure. Post-procedural neurological assessment was performed, showing return to baseline, prior to discharge. The patient was provided with post-procedure discharge instructions, including a section on how to identify potential problems. Should any problems arise concerning this procedure, the patient was given instructions to immediately contact us , at any time, without hesitation. In any case, we plan to contact the patient by telephone for a follow-up status report regarding this interventional procedure.  Comments:  No additional relevant information.  Plan of Care (POC)  Orders:  Orders Placed This Encounter  Procedures   LUMBAR FACET(MEDIAL BRANCH NERVE BLOCK) MBNB    Scheduling Instructions:     Procedure: Lumbar facet block (AKA.: Lumbosacral medial branch nerve block)     Side: Bilateral     Level: L3-4, L4-5, L5-S1, and TBD Facets (L2, L3, L4, L5, S1, and TBD Medial Branch Nerves)     Sedation: Patient's choice.     Timeframe: Today    Where will this procedure be performed?:   [name]   DG [facility] C-ARM 1-60 MIN NO REPORT    Intraoperative interpretation by procedural physician at [facility].    Standing Status:   Standing    Number of Occurrences:   1    Reason for exam::   Assistance in needle guidance and placement for procedures requiring needle placement in or near specific anatomical locations not easily accessible without such assistance.   Informed  Consent Details: Physician/Practitioner Attestation; Transcribe to consent form and obtain patient signature    Nursing Order: Transcribe to consent form and obtain patient signature. Note: Always confirm laterality of pain with Ms. Waddy, before procedure.    Physician/Practitioner attestation of informed consent for procedure/surgical case:   I, the physician/practitioner, attest that I have discussed with the patient the benefits, risks, side effects, alternatives, likelihood of achieving goals and potential problems during recovery for the procedure that I have provided informed consent.    Procedure:   Lumbar Facet Block  under fluoroscopic guidance    Physician/Practitioner performing the procedure:   Tymika Grilli A. Barth Borne MD    Indication/Reason:   Low Back Pain, with our without leg pain, due to Facet Joint Arthralgia (Joint Pain) Spondylosis (Arthritis of the Spine), without myelopathy or radiculopathy (Nerve Damage).   Care order/instruction: Please confirm that the patient has stopped the Coumadin  (Warfarin) X 5 days prior to procedure or surgery.    Please confirm that the patient has stopped the Coumadin  (Warfarin) X 5 days prior to procedure or surgery.    Standing Status:   Standing    Number of Occurrences:   1   Provide equipment / supplies at bedside    Procedure tray: "Block Tray" (Disposable  single use) Skin infiltration needle: Regular 1.5-in, 25-G, (x1) Block Needle type: Spinal Amount/quantity: 4 Size: Medium (5-inch)  Gauge: 22G    Standing Status:   Standing    Number of Occurrences:   1    Specify:   Block Tray   Saline lock IV    Have LR [id number] mL available and administer at 125 mL/hr if patient becomes hypotensive.    Standing Status:   Standing    Number of Occurrences:   1   Bleeding precautions    Standing Status:   Standing    Number of Occurrences:   1   Chronic Opioid Analgesic:  No chronic opioid analgesics therapy prescribed by our practice, 2ry  to Medication Agreement Violation. Oxycodone  IR 10 mg, 1 tab PO qd,  from PCP.  (Getting oxycodone  from PCP in multiple occasions, according to PMP)(11/21/20; 12/26/20; 01/16/21; 02/13/21). Opioid analgesic pharmacotherapy D/C'ed on 03/03/2021. MME: 20 mg/day.   Medications ordered for procedure: Meds ordered this encounter  Medications   lidocaine  (XYLOCAINE ) 2 % (with pres) injection 400 mg   pentafluoroprop-tetrafluoroeth (GEBAUERS) aerosol   midazolam  (VERSED ) 5 MG/5ML injection 0.5-2 mg    Make sure Flumazenil is available in the pyxis when using this medication. If oversedation occurs, administer 0.2 mg IV over 15 sec. If after 45 sec no response, administer 0.2 mg again over 1 min; may repeat at 1 min intervals; not to exceed 4 doses (1 mg)   fentaNYL  (SUBLIMAZE ) injection 25-50 mcg    Make sure Narcan  is available in the pyxis when using this medication. In the event of respiratory depression (RR< 8/min): Titrate NARCAN  (naloxone ) in increments of 0.1 to 0.2 mg IV at 2-3 minute intervals, until desired degree of reversal.   ropivacaine  (PF) 2 mg/mL (0.2%) (NAROPIN ) injection 18 mL   triamcinolone  acetonide (KENALOG -40) injection 80 mg   Medications administered: We administered lidocaine , pentafluoroprop-tetrafluoroeth, midazolam , ropivacaine  (PF) 2 mg/mL (0.2%), and triamcinolone  acetonide.  See the medical record for exact dosing, route, and time of administration.  Follow-up plan:   Return in about 2 weeks (around 10/18/2023) for (Face2F), (PPE).       Interventional Therapies  Risk  Complexity Considerations:   ANTICOAGULATION: Coumadin  (Stop: 5 days  Re-start: 2hrs)  Hx DVT & PE ALLERGY: Latex Morbid obesity (class III) (BMI>40)  metabolic syndrome  NIDDM  HTN Difficult Lumbar RFA due to morbid obesity    Planned  Pending:    Under consideration:   Palliative bilateral lumbar facet MBB [id number]  Therapeutic bilateral lumbar facet RFA #2 (patient indicates preferring to  have only the block w/o RFA.)   Completed:   Therapeutic bilateral lumbar facet MBB [id number] (06/21/2023) [facility])  Diagnostic/therapeutic right IA hip inj. x2 (01/28/2022) (100/100/85/85)  Diagnostic/therapeutic left IA hip inj. x1 (01/28/2022) (100/100/85/85)  Diagnostic/therapeutic right trochanteric bursa inj. x1 (09/15/2021) (85/85/85)  Diagnostic/therapeutic left trochanteric bursa inj. x1 (01/28/2022) (100/100/85/85)  Therapeutic left lumbar facet RFA x1 (07/21/2021) (100/100/100 x1 week/80) (Lasted 9 months)  Therapeutic right lumbar facet RFA x1 (07/07/2021) (100/100/80/80) (Lasted 9 months)  Palliative/Therapeutic left L4-5 LESI x1 (11/10/2017) (100/80/0/0-25)  Diagnostic/therapeutic right L4 TFESI x1 (11/19/2021) (90/80/0/0) (fell at [employer]) Palliative right SI joint block x9 (01/28/2022) (100/100/80/80)  Diagnostic left SI joint block x2 (01/28/2022) (100/100/80/80)  Palliative/Therapeutic (Midline) caudal ESI x3 (06/08/2018) (100/100/100 x 2-3 days/0)    Therapeutic  Palliative (PRN) options:   NO PRN procedures without F2F exam by Dr. Zacharee Gaddie due to multifactorial etiology of chronic pain. No more procedures until after 08/14/2022.    Pharmacotherapy  Nonopioids transferred 03/27/2020: Calcium   Recent Visits Date Type Provider Dept  09/19/23 Office Visit Renaldo Caroli, MD [name]  Showing recent visits within past 90 days and meeting all other requirements Today's Visits Date Type Provider Dept  10/04/23 Procedure visit Renaldo Caroli, MD [name]  Showing today's visits and meeting all other requirements Future Appointments Date Type Provider Dept  10/24/23 Appointment Renaldo Caroli, MD [name]  Showing future appointments within next 90 days and meeting all other requirements  Disposition: Discharge home  Discharge (Date  Time): 10/04/2023; 1005 hrs.   Primary Care Physician: Antonio Baumgarten, MD Location:  [facility] Note by: Candi Chafe, MD (TTS technology used. I apologize for any typographical errors that were not detected and corrected.) Date: 10/04/2023; Time: 10:02 AM  Disclaimer:  Medicine is not an Visual merchandiser. The only guarantee in medicine is that nothing is guaranteed. It is important to note that the decision to proceed with this intervention was based on the information collected from the patient. The Data and conclusions were drawn from the patient's questionnaire, the interview, and the physical examination. Because the information was provided in large part by the patient, it cannot be guaranteed that it has not been purposely or unconsciously manipulated. Every effort has been made to obtain as much relevant data as possible for this evaluation. It is important to note that the conclusions that lead to this procedure are derived in large part from the available data. Always take into account that the treatment will also be dependent on availability of resources and existing treatment guidelines, considered by other Pain Management Practitioners as being common knowledge and practice, at the time of the intervention. For Medico-Legal purposes, it is also important to point out that variation in procedural techniques and pharmacological choices are the acceptable norm. The indications, contraindications, technique, and results of the above procedure should only be interpreted and judged by a Board-Certified Interventional Pain Specialist with extensive familiarity and expertise in the same exact procedure and technique.

## 2023-10-04 NOTE — Patient Instructions (Addendum)
 Post-Procedure Discharge Instructions  Instructions: Apply ice:  Purpose: This will minimize any swelling and discomfort after procedure.  When: Day of procedure, as soon as you get home. How: Fill a plastic sandwich bag with crushed ice. Cover it with a small towel and apply to injection site. How long: (15 min on, 15 min off) Apply for 15 minutes then remove x 15 minutes.  Repeat sequence on day of procedure, until you go to bed. Apply heat:  Purpose: To treat any soreness and discomfort from the procedure. When: Starting the next day after the procedure. How: Apply heat to procedure site starting the day following the procedure. How long: May continue to repeat daily, until discomfort goes away. Food intake: Start with clear liquids (like water) and advance to regular food, as tolerated.  Physical activities: Keep activities to a minimum for the first 8 hours after the procedure. After that, then as tolerated. Driving: If you have received any sedation, be responsible and do not drive. You are not allowed to drive for 24 hours after having sedation. Blood thinner: (Applies only to those taking blood thinners) You may restart your blood thinner 6 hours after your procedure. Insulin: (Applies only to Diabetic patients taking insulin) As soon as you can eat, you may resume your normal dosing schedule. Infection prevention: Keep procedure site clean and dry. Shower daily and clean area with soap and water. Post-procedure Pain Diary: Extremely important that this be done correctly and accurately. Recorded information will be used to determine the next step in treatment. For the purpose of accuracy, follow these rules: Evaluate only the area treated. Do not report or include pain from an untreated area. For the purpose of this evaluation, ignore all other areas of pain, except for the treated area. After your procedure, avoid taking a long nap and attempting to complete the pain diary after you  wake up. Instead, set your alarm clock to go off every hour, on the hour, for the initial 8 hours after the procedure. Document the duration of the numbing medicine, and the relief you are getting from it. Do not go to sleep and attempt to complete it later. It will not be accurate. If you received sedation, it is likely that you were given a medication that may cause amnesia. Because of this, completing the diary at a later time may cause the information to be inaccurate. This information is needed to plan your care. Follow-up appointment: Keep your post-procedure follow-up evaluation appointment after the procedure (usually 2 weeks for most procedures, 6 weeks for radiofrequencies). DO NOT FORGET to bring you pain diary with you.   Expect: (What should I expect to see with my procedure?) From numbing medicine (AKA: Local Anesthetics): Numbness or decrease in pain. You may also experience some weakness, which if present, could last for the duration of the local anesthetic. Onset: Full effect within 15 minutes of injected. Duration: It will depend on the type of local anesthetic used. On the average, 1 to 8 hours.  From steroids (Applies only if steroids were used): Decrease in swelling or inflammation. Once inflammation is improved, relief of the pain will follow. Onset of benefits: Depends on the amount of swelling present. The more swelling, the longer it will take for the benefits to be seen. In some cases, up to 10 days. Duration: Steroids will stay in the system x 2 weeks. Duration of benefits will depend on multiple posibilities including persistent irritating factors. Side-effects: If present, they may typically  last 2 weeks (the duration of the steroids). Frequent: Cramps (if they occur, drink Gatorade and take over-the-counter Magnesium 450-500 mg once to twice a day); water retention with temporary weight gain; increases in blood sugar; decreased immune system response; increased  appetite. Occasional: Facial flushing (red, warm cheeks); mood swings; menstrual changes. Uncommon: Long-term decrease or suppression of natural hormones; bone thinning. (These are more common with higher doses or more frequent use. This is why we prefer that our patients avoid having any injection therapies in other practices.)  Very Rare: Severe mood changes; psychosis; aseptic necrosis. From procedure: Some discomfort is to be expected once the numbing medicine wears off. This should be minimal if ice and heat are applied as instructed.  Call if: (When should I call?) You experience numbness and weakness that gets worse with time, as opposed to wearing off. New onset bowel or bladder incontinence. (Applies only to procedures done in the spine)  Emergency Numbers: Durning business hours (Monday - Thursday, 8:00 AM - 4:00 PM) (Friday, 9:00 AM - 12:00 Noon): [phone] After hours: [phone] NOTE: If you are having a problem and are unable connect with, or to talk to a provider, then go to your nearest urgent care or emergency department. If the problem is serious and urgent, please call 911.  Moderate Conscious Sedation, Adult, Care After After the procedure, it is common to have: Sleepiness for a few hours. Impaired judgment for a few hours. Trouble with balance. Nausea or vomiting if you eat too soon. Follow these instructions at home: For the time period you were told by your health care provider:  Rest. Do not participate in activities where you could fall or become injured. Do not drive or use machinery. Do not drink alcohol. Do not take sleeping pills or medicines that cause drowsiness. Do not make important decisions or sign legal documents. Do not take care of children on your own. Eating and drinking Follow instructions from your health care provider about what you may eat and drink. Drink enough fluid to keep your urine pale yellow. If you vomit: Drink clear  fluids slowly and in small amounts as you are able. Clear fluids include water, ice chips, low-calorie sports drinks, and fruit juice that has water added to it (diluted fruit juice). Eat light and bland foods in small amounts as you are able. These foods include bananas, applesauce, rice, lean meats, toast, and crackers. General instructions Take over-the-counter and prescription medicines only as told by your health care provider. Have a responsible adult stay with you for the time you are told. Do not use any products that contain nicotine or tobacco. These products include cigarettes, chewing tobacco, and vaping devices, such as e-cigarettes. If you need help quitting, ask your health care provider. Return to your normal activities as told by your health care provider. Ask your health care provider what activities are safe for you. Your health care provider may give you more instructions. Make sure you know what you can and cannot do.   Contact a health care provider if: You are still sleepy or having trouble with balance after 24 hours. You feel light-headed. You vomit every time you eat or drink. You get a rash. You have a fever. You have redness or swelling around the IV site. Get help right away if: You have trouble breathing. You start to feel confused at home. These symptoms may be an emergency. Get help right away. Call 911. Do not wait to see  if the symptoms will go away. Do not drive yourself to the hospital. This information is not intended to replace advice given to you by your health care provider. Make sure you discuss any questions you have with your health care provider. Document Revised: 11/23/2021 Document Reviewed: 11/23/2021 Elsevier Patient Education  2024 [employer].

## 2023-10-05 ENCOUNTER — Telehealth: Payer: Self-pay

## 2023-10-05 NOTE — Telephone Encounter (Signed)
 Attempt to contact for post-procedure follow-up. No answer and left voicemail message.

## 2023-10-06 ENCOUNTER — Ambulatory Visit
Admission: RE | Admit: 2023-10-06 | Discharge: 2023-10-06 | Disposition: A | Discharge status: Home / Self Care | Source: Ambulatory Visit | Attending: Oncology | Admitting: Oncology

## 2023-10-06 ENCOUNTER — Encounter: Payer: Self-pay | Admitting: Internal Medicine

## 2023-10-06 ENCOUNTER — Encounter: Payer: Self-pay | Admitting: Oncology

## 2023-10-06 DIAGNOSIS — J90 Pleural effusion, not elsewhere classified: Secondary | ICD-10-CM | POA: Diagnosis not present

## 2023-10-06 DIAGNOSIS — C3411 Malignant neoplasm of upper lobe, right bronchus or lung: Secondary | ICD-10-CM | POA: Diagnosis not present

## 2023-10-06 DIAGNOSIS — J8410 Pulmonary fibrosis, unspecified: Secondary | ICD-10-CM | POA: Diagnosis not present

## 2023-10-06 DIAGNOSIS — K439 Ventral hernia without obstruction or gangrene: Secondary | ICD-10-CM | POA: Diagnosis not present

## 2023-10-06 DIAGNOSIS — R160 Hepatomegaly, not elsewhere classified: Secondary | ICD-10-CM | POA: Diagnosis not present

## 2023-10-06 MED ORDER — IOHEXOL 300 MG/ML  SOLN
100.0000 mL | Freq: Once | INTRAMUSCULAR | Status: AC | PRN
  Administered 2023-10-06: 100 mL via INTRAVENOUS

## 2023-10-06 NOTE — Telephone Encounter (Signed)
 Oral Oncology Patient Advocate Encounter   Received notification that the application for assistance for Tagrisso through [employer] has been approved.   [employer] phone number [phone].   Effective dates: 10/05/2023 through 05/23/2024  I have spoken to the patient.  [name] Benjaman Branch, CPhT Oncology Pharmacy Patient Advocate [facility] Direct Number: [phone] Fax: [phone]

## 2023-10-07 ENCOUNTER — Ambulatory Visit: Payer: Self-pay | Admitting: Oncology

## 2023-10-07 ENCOUNTER — Telehealth: Payer: Self-pay | Admitting: *Deleted

## 2023-10-07 NOTE — Telephone Encounter (Signed)
 The patient just called again saying that she got a message from her insurance health advantage for the Tagrisso and she just had a CT scan scan yesterday and so #1 she cannot start it today #2 she does not know if she needs to start it.  And I told her that I did send the message in to you and you are still working on patient's and hopefully he will be able to talk to her by the end of the day

## 2023-10-07 NOTE — Telephone Encounter (Signed)
 Per Dr. Randy Buttery "Please have radiology compare present ct to prior pet from jan 2025".  Outbound call spoke to MJ and asked if the CT done yesterday 10/06/23 can be compared to the PET done on 06/03/23.  MJ stated once completed Dr. Randy Buttery should receive a message electronically.

## 2023-10-07 NOTE — Telephone Encounter (Signed)
 Patient called and said that she saw all the CT scan and she would like you to call her only to go over this CT scans because she has questions. She only wants to talk to Dr. Randy Buttery with the questions.  Telephone [phone]

## 2023-10-07 NOTE — Telephone Encounter (Signed)
-----   Message from Avonne Boettcher sent at 10/07/2023  8:27 AM EDT ----- Please have radiology compare present ct to prior pet from [facility] 2025

## 2023-10-10 ENCOUNTER — Ambulatory Visit
Admission: RE | Admit: 2023-10-10 | Discharge: 2023-10-10 | Disposition: A | Discharge status: Home / Self Care | Source: Ambulatory Visit | Attending: Radiation Oncology | Admitting: Radiation Oncology

## 2023-10-10 ENCOUNTER — Encounter: Payer: Self-pay | Admitting: Internal Medicine

## 2023-10-10 ENCOUNTER — Encounter: Payer: Self-pay | Admitting: Radiation Oncology

## 2023-10-10 ENCOUNTER — Other Ambulatory Visit (HOSPITAL_COMMUNITY): Payer: Self-pay

## 2023-10-10 ENCOUNTER — Encounter: Payer: Self-pay | Admitting: Oncology

## 2023-10-10 VITALS — BP 120/72 | HR 89 | Temp 97.3°F | Resp 16 | Ht 63.0 in | Wt 190.0 lb

## 2023-10-10 DIAGNOSIS — C3411 Malignant neoplasm of upper lobe, right bronchus or lung: Secondary | ICD-10-CM | POA: Insufficient documentation

## 2023-10-10 NOTE — Telephone Encounter (Signed)
 I called her this morning and it went into vm. I left her a message

## 2023-10-10 NOTE — Progress Notes (Signed)
 Radiation Oncology Follow up Note  Name: Kara [name]   Date:   10/10/2023 MRN:  [id number] DOB: 1951-01-23    This 73 y.o. 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  REFERRING PROVIDER: Antonio Baumgarten, MD  HPI: Patient is a 73 year old female now out 1 month having completed concurrent chemoradiation therapy.  Patient received IMRT radiation therapy along with CarboTaxol which she tolerated well.  She is seen today in follow-up is doing well still has some pain in her right shoulder in the area where they drained fluid from her lung.  She specifically [place] cough hemoptysis chest tightness or any dysphagia.  She is currently being started on Tagrisso  through medical oncology.  She had a recent CT scan although early for treatment response showed unchanged posttreatment appearance of the right chest.  She had dense bandlike consolidation of fibrosis in the perihilar right lung.  She has an unchanged prominent mediastinal lymph nodes.  COMPLICATIONS OF TREATMENT: none  FOLLOW UP COMPLIANCE: keeps appointments   PHYSICAL EXAM:  BP 120/72   Pulse 89   Temp (!) 97.3 F (36.3 C) (Tympanic)   Resp 16   Ht 5\' 3"  (1.6 m)   Wt 190 lb (86.2 kg)   BMI 33.66 kg/m  Wheelchair-bound female in NAD.  Lungs are clear cardiac examination essentially unremarkable.  Abdomen is benign motor and sensory levels are equal and symmetric.  RADIOLOGY RESULTS: CT scan reviewed compatible with above-stated findings  PLAN: Present time patient is stable.  She will be starting oral Tagrisso  through medical oncology.  Of asked to see her back in 3 months with a repeat PET CT scan of her chest.  She continues close follow-up care and treatment through medical oncology.  Patient knows to call with any concerns.  I  would like to take this opportunity to thank you for allowing me to participate in the care of your patient.Kara Langdon, MD

## 2023-10-11 ENCOUNTER — Other Ambulatory Visit: Payer: Self-pay

## 2023-10-11 DIAGNOSIS — Z7901 Long term (current) use of anticoagulants: Secondary | ICD-10-CM | POA: Diagnosis not present

## 2023-10-11 DIAGNOSIS — Z86711 Personal history of pulmonary embolism: Secondary | ICD-10-CM | POA: Diagnosis not present

## 2023-10-13 ENCOUNTER — Telehealth: Payer: Self-pay | Admitting: *Deleted

## 2023-10-13 NOTE — Telephone Encounter (Signed)
 Per Dr. Randy Buttery "Tell her I will call at 4 pm" on 10/13/23.  Outbound call to patient;

## 2023-10-13 NOTE — Telephone Encounter (Signed)
 Patient medication was delivered on 10/12/23

## 2023-10-13 NOTE — Telephone Encounter (Signed)
 Tell her I will call at 4 pm

## 2023-10-13 NOTE — Telephone Encounter (Signed)
 I have tried calling her. Her number listed goes straight to voicemail

## 2023-10-13 NOTE — Telephone Encounter (Signed)
 I got in touch with Ms. Potier to let her know that Dr. Randy Buttery will be calling her at 4:00 today.  Kara [name] says that is great

## 2023-10-13 NOTE — Telephone Encounter (Signed)
 Patient called and said that she has the medication I am assuming it is to Tagrisso .  The patient has called today she only wants to talk to Dr. Randy Buttery before she thinks she can take the pill.  Phone is on the charger so you cannot use hers but you can call [phone] and that is Ms. Eberlin's husband

## 2023-10-21 DIAGNOSIS — Z7901 Long term (current) use of anticoagulants: Secondary | ICD-10-CM | POA: Diagnosis not present

## 2023-10-21 DIAGNOSIS — Z86711 Personal history of pulmonary embolism: Secondary | ICD-10-CM | POA: Diagnosis not present

## 2023-10-24 ENCOUNTER — Ambulatory Visit: Admitting: Pain Medicine

## 2023-10-30 NOTE — Progress Notes (Unsigned)
 PROVIDER NOTE: Interpretation of information contained herein should be left to medically-trained personnel. Specific patient instructions are provided elsewhere under "Patient Instructions" section of medical record. This document was created in part using AI and STT-dictation technology, any transcriptional errors that may result from this process are unintentional.  Patient: Kara [name]  Service: E/M   PCP: Antonio Baumgarten, MD  DOB: 1950/12/29  DOS: 10/31/2023  Provider: Candi Chafe, MD  MRN: [id number]  Delivery: Face-to-face  Specialty: Interventional Pain Management  Type: Established Patient  Setting: Ambulatory outpatient facility  Specialty designation: 09  Referring Prov.: Antonio Baumgarten, MD  Location: Outpatient office facility       History of present illness (HPI) Kara [name], a 73 y.o. year old adult, is here today because of her Chronic bilateral low back pain without sciatica [M54.50, G89.29]. Kara [name] primary complain today is No chief complaint on file.  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); Lumbar pars defect (L3 and L4) (Bilateral); Diabetic peripheral neuropathy (HCC); Chronic lower extremity pain (2ry area of Pain) (Bilateral); Chronic radicular pain of lower extremity; Osteoarthritis of hip (Right); Chronic low back pain (1ry area of Pain) (Bilateral) w/o sciatica; Spondylosis  without myelopathy or radiculopathy, lumbosacral region; Other specified dorsopathies, sacral and sacrococcygeal region; Secondary osteoarthritis of multiple sites; Sacral insufficiency fracture; DDD (degenerative disc disease), lumbosacral; Coccygodynia; Chronic sacroiliac joint pain (Left); Osteoarthritis of lumbar spine; Spondylolisthesis, lumbosacral region; Abnormal MRI, lumbar spine (08/09/2019); Spasm of muscle of lower back; Chronic hip pain (Bilateral) (R>L); Osteoarthritis of hips (Bilateral); Greater trochanteric bursitis (Right); Chronic hip pain (Right); Chronic low back pain (Right) w/o sciatica; Greater trochanteric bursitis (Left); Other bursitis of hip, not elsewhere classified (gluteus medius bursa) (Left); Lumbar facet joint pain; Piriformis muscle pain (Right); Abdominal pain; Chest pain in adult; Chest pain; Mediastinal adenopathy; Cancer of upper lobe of right lung (HCC); and Recurrent non-small cell lung cancer (NSCLC) (HCC) on their pertinent problem list.  Pain Assessment: Severity of   is reported as a  /10. Location:    / . Onset:  . Quality:  . Timing:  . Modifying factor(s):  Aaron Aas Vitals:  vitals were not taken for this visit.  BMI: Estimated body mass index is 33.66 kg/m as calculated from the following:   Height as of 10/10/23: 5\' 3"  (1.6 m).   Weight as of 10/10/23: 190 lb (86.2 kg).  Last encounter: 09/19/2023. Last procedure: 10/04/2023.  Reason for encounter: post-procedure evaluation and assessment.   Discussed the use of AI scribe software for clinical note transcription with the patient, who gave verbal consent to proceed.  History of Present Illness         Post-Procedure Evaluation   Type: Lumbar Facet, Medial Branch Block(s)   [id number]   Laterality: Bilateral  Level: L2, L3, L4, L5, and S1 Medial Branch Level(s). Injecting these levels blocks the L3-4, L4-5, and L5-S1 lumbar facet joints.  Imaging: Fluoroscopic guidance Spinal ([id number]) Anesthesia: Local  anesthesia (1-2% Lidocaine ) Anxiolysis: IV Versed          Sedation: Moderate  Sedation                       DOS: 10/04/2023 Performed by: Candi Chafe, MD  Primary Purpose: Diagnostic/Therapeutic Indications: Low back pain severe enough to impact quality of life or function. 1. Chronic low back pain (1ry area of Pain) (Bilateral) w/o sciatica   2. 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 Lumbar facet joint pain   4. Lumbar facet arthropathy (L3-4, L4-5, and L5-S1) (Bilateral)   5. Lumbar facet syndrome (Bilateral)   6. 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 Spondylosis without myelopathy or radiculopathy, lumbosacral region   8. Chronic anticoagulation (COUMADIN )    NAS-11 Pain score:   Pre-procedure: 8 /10   Post-procedure: 0-No pain/10     Effectiveness:  Initial hour after procedure:   ***. Subsequent 4-6 hours post-procedure:   ***. Analgesia past initial 6 hours:   ***. Ongoing improvement:  Analgesic:  *** Function:    ***    ROM:    ***      Pharmacotherapy Assessment   Analgesic: No chronic opioid analgesics therapy prescribed by our practice, 2ry to Medication Agreement Violation. Oxycodone  IR 10 mg, 1 tab PO qd,  from PCP.  (Getting oxycodone  from PCP in multiple occasions, according to PMP)(11/21/20; 12/26/20; 01/16/21; 02/13/21). Opioid analgesic pharmacotherapy D/C'ed on 03/03/2021. MME: 20 mg/day.   Monitoring: [place] PMP: PDMP reviewed during this encounter.       Pharmacotherapy: No side-effects or adverse reactions reported. Compliance: No problems identified. Effectiveness: Clinically acceptable.  No notes on file  No results found for: "CBDTHCR" No results found for: "D8THCCBX" No results found for: "D9THCCBX"  UDS:  Summary  Date Value Ref Range Status  11/12/2020 Note  Final    Comment:    ==================================================================== [employer]  (MW) ==================================================================== Test                             Result       Flag       Units  Drug Present and Declared for Prescription Verification   Norhydrocodone                 61           EXPECTED   ng/mg creat    Norhydrocodone is an expected metabolite of hydrocodone .    Tramadol                        >4202        EXPECTED   ng/mg creat   O-Desmethyltramadol            >4202        EXPECTED   ng/mg creat   N-Desmethyltramadol            1712         EXPECTED   ng/mg creat    Source of tramadol  is a prescription medication. O-desmethyltramadol    and N-desmethyltramadol are expected metabolites of tramadol .  Drug Absent but Declared for Prescription Verification   Hydrocodone                     Not Detected UNEXPECTED ng/mg creat    Hydrocodone  is almost always present in patients taking this drug    consistently. Absence of hydrocodone  could be due to lapse of time  since the last dose or unusual pharmacokinetics (rapid metabolism).  ==================================================================== Test                      Result    Flag   Units      Ref Range   Creatinine              119              mg/dL      >=40 ==================================================================== Declared Medications:  The flagging and interpretation on this report are based on the  following declared medications.  Unexpected results may arise from  inaccuracies in the declared medications.   **Note: The testing scope of this panel includes these medications:   Hydrocodone   Tramadol    **Note: The testing scope of this panel does not include the  following reported medications:   Acetaminophen   Alendronate (Fosamax)  Aspirin   Calcium   Candesartan  Cholecalciferol   Cyanocobalamin   Cyclobenzaprine   Dulaglutide  Gabapentin   Glipizide   Meclizine  Multivitamin  Nystatin  Ondansetron   Ropinirole   Tolterodine   Warfarin ==================================================================== For clinical consultation, please call [phone]. ====================================================================      ROS  Constitutional: Denies any fever or chills Gastrointestinal: No reported hemesis, hematochezia, vomiting, or acute GI distress Musculoskeletal: Denies any acute onset joint swelling, redness, loss of ROM, or weakness Neurological: No reported episodes of acute onset apraxia, aphasia, dysarthria, agnosia, amnesia, paralysis, loss of coordination, or loss of consciousness  Medication Review  Dulaglutide, HYDROcodone -acetaminophen , Magnesium Oxide -Mg Supplement, Vitamin B-12, bisacodyl , candesartan, cholecalciferol , cyclobenzaprine , gabapentin , glipiZIDE , glucose blood, omeprazole , ondansetron , osimertinib  mesylate, oxybutynin , potassium chloride  SA, prochlorperazine , rOPINIRole , sucralfate , and warfarin  History Review  Allergy: Kara [name] is allergic to adhesive [tape], avapro  [irbesartan ], morphine  and codeine , other, and latex. Drug: Kara [name]  reports no history of drug use. Alcohol :  reports no history of alcohol  use. Tobacco:  reports that she has never smoked. She has been exposed to tobacco smoke. She has never used smokeless tobacco. Social: Kara [name]  reports that she has never smoked. She has been exposed to tobacco smoke. She has never used smokeless tobacco. She reports that she does not drink alcohol  and does not use drugs. Medical:  has a past medical history of Anginal pain (HCC), Arthritis, CHF (congestive heart failure) (HCC), Chronic anticoagulation, Degenerative disc disease, lumbar, Diverticulitis (2018), Diverticulosis, DVT of lower extremity, bilateral (HCC) (2018), Essential hypertension, History of being hospitalized, History of hiatal hernia, Mediastinal adenopathy (04/2023), MRSA nasal colonization, Non-small cell lung cancer (HCC) (2022), Obesity (BMI  30-39.9), Osteoarthritis, Osteoporosis, PE (pulmonary thromboembolism) (HCC) (2018), Pneumonia, Small bowel obstruction (HCC) (03/20/2023), 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HCC), Type 2 diabetes mellitus with obesity (HCC), and Vertigo. Surgical: Kara [name]  has a past surgical history that includes Knee surgery (Right); Carpal tunnel release (Bilateral); Colon resection sigmoid (N/A, 11/02/2016); Colostomy (N/A, 11/02/2016); Sigmoidoscopy (N/A, 11/13/2016); PULMONARY VENOGRAPHY (N/A, 11/19/2016); Colonoscopy with propofol  (N/A, 02/03/2017); Colon surgery (11/02/2016); IVC FILTER INSERTION (Right, 10/2016); Colostomy reversal (N/A, 02/24/2017); Appendectomy (N/A, 02/24/2017); Lysis of adhesion (N/A, 02/24/2017); laparoscopy (N/A, 02/24/2017); Ileo loop diversion (N/A, 02/24/2017); Sigmoidoscopy (N/A, 05/19/2017); Ileostomy closure (N/A, 06/01/2017); IVC FILTER REMOVAL (N/A, 08/09/2017); Sacroplasty (N/A, 11/08/2017); Cataract extraction w/PHACO (Left, 11/21/2018); Cataract extraction w/PHACO (Right, 12/12/2018); Video bronchoscopy with endobronchial navigation (N/A, 02/23/2021); Flexible bronchoscopy (N/A, 05/16/2023); Endobronchial ultrasound (N/A, 05/16/2023); and IR IMAGING GUIDED PORT INSERTION (06/14/2023). Family: family history includes Breast cancer in her mother; Cancer in her mother; Diabetes in her mother and sister;  Heart disease in her father, mother, and sister.  Laboratory Chemistry Profile   Renal Lab Results  Component Value Date   BUN 8 10/03/2023   CREATININE 0.64 10/03/2023   GFRAA >60 07/09/2019   GFRNONAA >60 10/03/2023    Hepatic Lab Results  Component Value Date   AST 25 10/03/2023   ALT 15 10/03/2023   ALBUMIN 3.1 (L) 10/03/2023   ALKPHOS 45 10/03/2023   LIPASE 28 07/06/2023    Electrolytes Lab Results  Component Value Date   NA 137 10/03/2023   K 3.6 10/03/2023   CL 105 10/03/2023   CALCIUM  8.6 (L) 10/03/2023   MG 2.0 08/30/2023   PHOS 4.0 03/22/2023     Bone Lab Results  Component Value Date   25OHVITD1 25 (L) 11/02/2017   25OHVITD2 <1.0 11/02/2017   25OHVITD3 25 11/02/2017    Inflammation (CRP: Acute Phase) (ESR: Chronic Phase) Lab Results  Component Value Date   CRP 1.7 (H) 08/15/2020   ESRSEDRATE 12 11/02/2017   LATICACIDVEN 1.3 08/27/2023         Note: Above Lab results reviewed.  Recent Imaging Review  CT CHEST ABDOMEN PELVIS W CONTRAST Addendum: ADDENDUM REPORT: 10/07/2023 21:50   ADDENDUM:  Little overall change to prior PET-CT dated 06/03/2023, right  pleural effusion is very slightly diminished and high right  paratracheal nodes are slightly diminished. Treated perihilar mass  is unchanged and repeat PET-CT would be helpful if the primary point  of concern is residual active disease within this treated region.   Electronically Signed    By: Fredricka Jenny M.D.    On: 10/07/2023 21:50 Narrative: CLINICAL DATA:  Lung cancer restaging * Tracking Code: BO *  EXAM: CT CHEST, ABDOMEN, AND PELVIS WITH CONTRAST  TECHNIQUE: Multidetector CT imaging of the chest, abdomen and pelvis was performed following the standard protocol during bolus administration of intravenous contrast.  RADIATION DOSE REDUCTION: This exam was performed according to the departmental dose-optimization program which includes automated exposure control, adjustment of the mA and/or kV according to patient size and/or use of iterative reconstruction technique.  CONTRAST:  OMNIPAQUE  IOHEXOL  300 MG/ML  SOLN  COMPARISON:  CT chest angiogram, 08/08/2023, CT abdomen pelvis angiogram, 08/02/2023  FINDINGS: CT CHEST FINDINGS  Cardiovascular: Right chest port catheter. Aortic atherosclerosis. Normal heart size. No pericardial effusion.  Mediastinum/Nodes: Unchanged prominent pretracheal and paratracheal lymph nodes (series 2, image 15). Thyroid  gland, trachea, and esophagus demonstrate no significant findings.  Lungs/Pleura: Unchanged  post treatment appearance of the right chest with dense, bandlike consolidation and fibrosis of the perihilar right lung (series 3, image 63), with a trace, loculated right pleural effusion and extensive pleural thickening about the right hemithorax. Diffuse mosaic attenuation of the airspaces, consistent with small airways disease.  Musculoskeletal: No chest wall abnormality. No acute osseous findings.  CT ABDOMEN PELVIS FINDINGS  Hepatobiliary: No solid liver abnormality is seen. Hepatomegaly, maximum coronal span 20.4 cm. Hepatic steatosis. Numerous tiny gallstones contracted in the gallbladder. No gallbladder wall thickening. No biliary ductal dilatation.  Pancreas: Unremarkable. No pancreatic ductal dilatation or surrounding inflammatory changes.  Spleen: Normal in size without significant abnormality.  Adrenals/Urinary Tract: Adrenal glands are unremarkable. Kidneys are normal, without renal calculi, solid lesion, or hydronephrosis. Air-fluid level in the urinary bladder, presumably secondary to recent catheterization  Stomach/Bowel: Stomach is within normal limits. Appendix appears normal. No evidence of bowel wall thickening, distention, or inflammatory changes. Moderate burden of stool throughout the colon with stool balls in the rectum  Vascular/Lymphatic: Aortic atherosclerosis. No enlarged abdominal or pelvic lymph nodes.  Reproductive: No mass or other abnormality.  Other: Midline epigastric hernia containing nonobstructed transverse colon and mid small bowel (series 2, image 61). No ascites. Pelvic floor prolapse with cystocele.  Musculoskeletal: No acute osseous findings.  IMPRESSION: 1. Unchanged post treatment appearance of the right chest with dense, bandlike consolidation and fibrosis of the perihilar right lung, with a trace pleural effusion and extensive pleural thickening about the right hemithorax. 2. Unchanged prominent mediastinal lymph  nodes. 3. No evidence of lymphadenopathy or metastatic disease in the abdomen or pelvis. 4. Hepatomegaly and hepatic steatosis. 5. Cholelithiasis. 6. Midline epigastric hernia containing nonobstructed transverse colon and mid small bowel. 7. Pelvic floor prolapse with cystocele.  Aortic Atherosclerosis (ICD10-I70.0).  Electronically Signed: By: Fredricka Jenny M.D. On: 10/06/2023 20:24 Note: Reviewed         Physical Exam  General appearance: Well nourished, well developed, and well hydrated. In no apparent acute distress Mental status: Alert, oriented x 3 (person, place, & time)       Respiratory: No evidence of acute respiratory distress Eyes: PERLA Vitals: There were no vitals taken for this visit. BMI: Estimated body mass index is 33.66 kg/m as calculated from the following:   Height as of 10/10/23: 5\' 3"  (1.6 m).   Weight as of 10/10/23: 190 lb (86.2 kg). Ideal: Patient weight not recorded  Assessment   Diagnosis Status  1. Chronic low back pain (1ry area of Pain) (Bilateral) w/o sciatica   2. Lumbar facet joint pain   3. Lumbar facet syndrome (Bilateral)   4. Postop check    Controlled Controlled Controlled   Updated Problems: No problems updated.  Plan of Care  Problem-specific:  Assessment and Plan            Kara [name] has a current medication list which includes the following long-term medication(s): candesartan, glipizide , omeprazole , potassium chloride  sa, prochlorperazine , ropinirole , and sucralfate .  Pharmacotherapy (Medications Ordered): No orders of the defined types were placed in this encounter.  Orders:  No orders of the defined types were placed in this encounter.    Interventional Therapies  Risk  Complexity Considerations:   ANTICOAGULATION: Coumadin  (Stop: 5 days  Re-start: 2hrs)  Hx DVT & PE ALLERGY: Latex Morbid obesity (class III) (BMI>40)  metabolic syndrome  NIDDM  HTN Difficult Lumbar RFA due to morbid  obesity    Planned  Pending:    Under consideration:   Palliative bilateral lumbar facet MBB [id number]  Therapeutic bilateral lumbar facet RFA #2 (patient indicates preferring to have only the block w/o RFA.)   Completed:   Therapeutic bilateral lumbar facet MBB [id number] (06/21/2023) [facility])  Diagnostic/therapeutic right IA hip inj. x2 (01/28/2022) (100/100/85/85)  Diagnostic/therapeutic left IA hip inj. x1 (01/28/2022) (100/100/85/85)  Diagnostic/therapeutic right trochanteric bursa inj. x1 (09/15/2021) (85/85/85)  Diagnostic/therapeutic left trochanteric bursa inj. x1 (01/28/2022) (100/100/85/85)  Therapeutic left lumbar facet RFA x1 (07/21/2021) (100/100/100 x1 week/80) (Lasted 9 months)  Therapeutic right lumbar facet RFA x1 (07/07/2021) (100/100/80/80) (Lasted 9 months)  Palliative/Therapeutic left L4-5 LESI x1 (11/10/2017) (100/80/0/0-25)  Diagnostic/therapeutic right L4 TFESI x1 (11/19/2021) (90/80/0/0) (fell at [employer]) Palliative right SI joint block x9 (01/28/2022) (100/100/80/80)  Diagnostic left SI joint block x2 (01/28/2022) (100/100/80/80)  Palliative/Therapeutic (Midline) caudal ESI x3 (06/08/2018) (100/100/100 x 2-3 days/0)    Therapeutic  Palliative (PRN) options:   NO PRN procedures without F2F exam by Dr. Wrigley Plasencia due to multifactorial etiology of chronic pain. No  more procedures until after 08/14/2022.    Pharmacotherapy  Nonopioids transferred 03/27/2020: Calcium       No follow-ups on file.    Recent Visits Date Type Provider Dept  10/04/23 Procedure visit Renaldo Caroli, MD [name]  09/19/23 Office Visit Renaldo Caroli, MD [name]  Showing recent visits within past 90 days and meeting all other requirements Future Appointments Date Type Provider Dept  10/31/23 Appointment Renaldo Caroli, MD [name]  Showing future appointments within next 90 days and meeting all other requirements  I discussed the assessment  and treatment plan with the patient. The patient was provided an opportunity to ask questions and all were answered. The patient agreed with the plan and demonstrated an understanding of the instructions.  Patient advised to call back or seek an in-person evaluation if the symptoms or condition worsens.  Duration of encounter: *** minutes.  Total time on encounter, as per AMA guidelines included both the face-to-face and non-face-to-face time personally spent by the physician and/or other qualified health care professional(s) on the day of the encounter (includes time in activities that require the physician or other qualified health care professional and does not include time in activities normally performed by clinical staff). Physician's time may include the following activities when performed: Preparing to see the patient (e.g., pre-charting review of records, searching for previously ordered imaging, lab work, and nerve conduction tests) Review of prior analgesic pharmacotherapies. Reviewing PMP Interpreting ordered tests (e.g., lab work, imaging, nerve conduction tests) Performing post-procedure evaluations, including interpretation of diagnostic procedures Obtaining and/or reviewing separately obtained history Performing a medically appropriate examination and/or evaluation Counseling and educating the patient/family/caregiver Ordering medications, tests, or procedures Referring and communicating with other health care professionals (when not separately reported) Documenting clinical information in the electronic or other health record Independently interpreting results (not separately reported) and communicating results to the patient/ family/caregiver Care coordination (not separately reported)  Note by: Candi Chafe, MD (TTS and AI technology used. I apologize for any typographical errors that were not detected and corrected.) Date: 10/31/2023; Time: 7:52 AM

## 2023-10-31 ENCOUNTER — Ambulatory Visit: Admitting: Oncology

## 2023-10-31 ENCOUNTER — Ambulatory Visit (HOSPITAL_BASED_OUTPATIENT_CLINIC_OR_DEPARTMENT_OTHER): Admitting: Pain Medicine

## 2023-10-31 DIAGNOSIS — Z09 Encounter for follow-up examination after completed treatment for conditions other than malignant neoplasm: Secondary | ICD-10-CM

## 2023-10-31 DIAGNOSIS — M47816 Spondylosis without myelopathy or radiculopathy, lumbar region: Secondary | ICD-10-CM

## 2023-10-31 DIAGNOSIS — G8929 Other chronic pain: Secondary | ICD-10-CM

## 2023-10-31 DIAGNOSIS — M5459 Other low back pain: Secondary | ICD-10-CM

## 2023-10-31 DIAGNOSIS — Z91199 Patient's noncompliance with other medical treatment and regimen due to unspecified reason: Secondary | ICD-10-CM

## 2023-11-01 ENCOUNTER — Inpatient Hospital Stay: Attending: Oncology | Admitting: Oncology

## 2023-11-01 ENCOUNTER — Inpatient Hospital Stay

## 2023-11-01 ENCOUNTER — Inpatient Hospital Stay: Admitting: Pharmacist

## 2023-11-01 ENCOUNTER — Encounter: Payer: Self-pay | Admitting: Oncology

## 2023-11-01 ENCOUNTER — Telehealth: Payer: Self-pay

## 2023-11-01 VITALS — BP 125/70 | HR 95 | Temp 97.8°F | Resp 15 | Wt 187.0 lb

## 2023-11-01 DIAGNOSIS — C3411 Malignant neoplasm of upper lobe, right bronchus or lung: Secondary | ICD-10-CM

## 2023-11-01 DIAGNOSIS — Z95828 Presence of other vascular implants and grafts: Secondary | ICD-10-CM | POA: Diagnosis not present

## 2023-11-01 DIAGNOSIS — M549 Dorsalgia, unspecified: Secondary | ICD-10-CM | POA: Diagnosis not present

## 2023-11-01 DIAGNOSIS — D6959 Other secondary thrombocytopenia: Secondary | ICD-10-CM

## 2023-11-01 DIAGNOSIS — Z79899 Other long term (current) drug therapy: Secondary | ICD-10-CM | POA: Insufficient documentation

## 2023-11-01 DIAGNOSIS — Z7901 Long term (current) use of anticoagulants: Secondary | ICD-10-CM | POA: Diagnosis not present

## 2023-11-01 DIAGNOSIS — R5383 Other fatigue: Secondary | ICD-10-CM | POA: Diagnosis not present

## 2023-11-01 DIAGNOSIS — Z803 Family history of malignant neoplasm of breast: Secondary | ICD-10-CM | POA: Diagnosis not present

## 2023-11-01 DIAGNOSIS — Z86711 Personal history of pulmonary embolism: Secondary | ICD-10-CM | POA: Diagnosis not present

## 2023-11-01 DIAGNOSIS — T50905A Adverse effect of unspecified drugs, medicaments and biological substances, initial encounter: Secondary | ICD-10-CM | POA: Diagnosis not present

## 2023-11-01 LAB — CBC WITH DIFFERENTIAL (CANCER CENTER ONLY)
Abs Immature Granulocytes: 0.01 10*3/uL (ref 0.00–0.07)
Basophils Absolute: 0 10*3/uL (ref 0.0–0.1)
Basophils Relative: 0 %
Eosinophils Absolute: 0 10*3/uL (ref 0.0–0.5)
Eosinophils Relative: 1 %
HCT: 38.4 % (ref 36.0–46.0)
Hemoglobin: 12.1 g/dL (ref 12.0–15.0)
Immature Granulocytes: 0 %
Lymphocytes Relative: 16 %
Lymphs Abs: 0.5 10*3/uL — ABNORMAL LOW (ref 0.7–4.0)
MCH: 30.9 pg (ref 26.0–34.0)
MCHC: 31.5 g/dL (ref 30.0–36.0)
MCV: 98.2 fL (ref 80.0–100.0)
Monocytes Absolute: 0.3 10*3/uL (ref 0.1–1.0)
Monocytes Relative: 8 %
Neutro Abs: 2.4 10*3/uL (ref 1.7–7.7)
Neutrophils Relative %: 75 %
Platelet Count: 78 10*3/uL — ABNORMAL LOW (ref 150–400)
RBC: 3.91 MIL/uL (ref 3.87–5.11)
RDW: 12.7 % (ref 11.5–15.5)
WBC Count: 3.2 10*3/uL — ABNORMAL LOW (ref 4.0–10.5)
nRBC: 0 % (ref 0.0–0.2)

## 2023-11-01 LAB — CMP (CANCER CENTER ONLY)
ALT: 22 U/L (ref 0–44)
AST: 28 U/L (ref 15–41)
Albumin: 3.6 g/dL (ref 3.5–5.0)
Alkaline Phosphatase: 49 U/L (ref 38–126)
Anion gap: 7 (ref 5–15)
BUN: 12 mg/dL (ref 8–23)
CO2: 23 mmol/L (ref 22–32)
Calcium: 8.6 mg/dL — ABNORMAL LOW (ref 8.9–10.3)
Chloride: 107 mmol/L (ref 98–111)
Creatinine: 0.83 mg/dL (ref 0.44–1.00)
GFR, Estimated: >60 mL/min
Glucose, Bld: 160 mg/dL — ABNORMAL HIGH (ref 70–99)
Potassium: 3.2 mmol/L — ABNORMAL LOW (ref 3.5–5.1)
Sodium: 137 mmol/L (ref 135–145)
Total Bilirubin: 0.5 mg/dL (ref 0.0–1.2)
Total Protein: 6.8 g/dL (ref 6.5–8.1)

## 2023-11-01 NOTE — Progress Notes (Unsigned)
 PROVIDER NOTE: Interpretation of information contained herein should be left to medically-trained personnel. Specific patient instructions are provided elsewhere under Patient Instructions section of medical record. This document was created in part using AI and STT-dictation technology, any transcriptional errors that may result from this process are unintentional.  Patient: Kara [name]  Service: E/M   PCP: Antonio Baumgarten, MD  DOB: 05/24/1951  DOS: 11/02/2023  Provider: Candi Chafe, MD  MRN: [id number]  Delivery: Face-to-face  Specialty: Interventional Pain Management  Type: Established Patient  Setting: Ambulatory outpatient facility  Specialty designation: 09  Referring Prov.: Antonio Baumgarten, MD  Location: Outpatient office facility       History of present illness (HPI) Kara [name], a 73 y.o. year old adult, is here today because of her Chronic bilateral low back pain without sciatica [M54.50, G89.29]. Ms. Mathenia primary complain today is Back Pain (lower)  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); Lumbar pars defect (L3 and L4) (Bilateral); Diabetic peripheral neuropathy (HCC); Chronic lower extremity pain (2ry area of Pain) (Bilateral); Chronic radicular pain of lower extremity; Osteoarthritis of hip (Right); Chronic low back pain (1ry area of Pain) (Bilateral) w/o sciatica; Spondylosis without  myelopathy or radiculopathy, lumbosacral region; Other specified dorsopathies, sacral and sacrococcygeal region; Secondary osteoarthritis of multiple sites; Sacral insufficiency fracture; DDD (degenerative disc disease), lumbosacral; Coccygodynia; Chronic sacroiliac joint pain (Left); Osteoarthritis of lumbar spine; Spondylolisthesis, lumbosacral region; Abnormal MRI, lumbar spine (08/09/2019); Spasm of muscle of lower back; Chronic hip pain (Bilateral) (R>L); Osteoarthritis of hips (Bilateral); Greater trochanteric bursitis (Right); Chronic hip pain (Right); Chronic low back pain (Right) w/o sciatica; Greater trochanteric bursitis (Left); Other bursitis of hip, not elsewhere classified (gluteus medius bursa) (Left); Lumbar facet joint pain; Piriformis muscle pain (Right); Abdominal pain; Chest pain in adult; Chest pain; Mediastinal adenopathy; Cancer of upper lobe of right lung (HCC); and Recurrent non-small cell lung cancer (NSCLC) (HCC) on their pertinent problem list.  Pain Assessment: Severity of Chronic pain is reported as a 8 /10. Location: Back Lower/denies. Onset: More than a month ago. Quality: [place]. Timing: Constant. Modifying factor(s): rest, medications. Vitals:  height is 5' 3 (1.6 m) and weight is 187 lb (84.8 kg). Her temporal temperature is 97.5 F (36.4 C) (abnormal). Her blood pressure is 109/68 and her pulse is 85. Her respiration is 16 and oxygen saturation is 99%.  BMI: Estimated body mass index is 33.13 kg/m as calculated from the following:   Height as of this encounter: 5' 3 (1.6 m).   Weight as of this encounter: 187 lb (84.8 kg).  Last encounter: 10/31/2023. Last procedure: 10/04/2023.  Reason for encounter: post-procedure evaluation and assessment.   Discussed the use of AI scribe software for clinical note transcription with the patient, who gave verbal consent to proceed.  History of Present Illness   Kara [name] is a 73 year old female with cancer who  presents with pain management concerns.  She experiences significant pain relief from a recent injection. The pain is most severe on the right side.  She has completed radiation and chemotherapy for her cancer and is currently taking Tagrisso  daily. The medication is not available at local pharmacies and must be ordered. She is unsure of the exact spelling of the medication but believes it is 'Progresso'.     Post-Procedure Evaluation   Type: Lumbar Facet, Medial Branch Block(s)   [id number]   Laterality: Bilateral  Level: L2, L3, L4, L5, and S1 Medial Branch Level(s). Injecting these levels blocks the L3-4, L4-5, and L5-S1 lumbar facet joints.  Imaging: Fluoroscopic guidance Spinal ([id number]) Anesthesia: Local anesthesia (1-2% Lidocaine ) Anxiolysis: IV Versed          Sedation: Moderate Sedation                       DOS: 10/04/2023 Performed by: Candi Chafe, MD  Primary Purpose: Diagnostic/Therapeutic Indications: Low back pain severe enough to impact quality of life or function. 1. Chronic low back pain (1ry area of Pain) (Bilateral) w/o sciatica   2. 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 Lumbar facet joint pain   4. Lumbar facet arthropathy (L3-4, L4-5, and L5-S1) (Bilateral)   5. Lumbar facet syndrome (Bilateral)   6. 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 Spondylosis without myelopathy or radiculopathy, lumbosacral region   8. Chronic anticoagulation (COUMADIN )    NAS-11 Pain score:   Pre-procedure: 8 /10   Post-procedure: 0-No pain/10     Effectiveness:  Initial hour after procedure: 100 %. Subsequent 4-6 hours post-procedure: 100 %. Analgesia past initial 6 hours: 80 %. Ongoing improvement:  Analgesic: The patient indicates having attained 100% relief of the pain for the duration of local anesthetic followed by an ongoing 80% improvement of the low back pain. Function: Kara [name] reports improvement in function ROM:  Kara [name] reports improvement in ROM  Pharmacotherapy Assessment   Analgesic: No chronic opioid analgesics therapy prescribed by our practice, 2ry to Medication Agreement Violation. Oxycodone  IR 10 mg, 1 tab PO qd,  from PCP.  (Getting oxycodone  from PCP in multiple occasions, according to PMP)(11/21/20; 12/26/20; 01/16/21; 02/13/21). Opioid analgesic pharmacotherapy D/C'ed on 03/03/2021. MME: 20 mg/day.   Monitoring: [place] PMP: PDMP reviewed during this encounter.       Pharmacotherapy: No side-effects or adverse reactions reported. Compliance: No problems identified. Effectiveness: Clinically acceptable.  No notes on file  UDS:  Summary  Date Value Ref Range Status  11/12/2020 Note  Final    Comment:    ==================================================================== [employer] Select 13 (MW) ==================================================================== Test                             Result       Flag       Units  Drug Present and Declared for Prescription Verification   Norhydrocodone                 61           EXPECTED   ng/mg creat    Norhydrocodone is an expected metabolite of hydrocodone .    Tramadol                        >4202        EXPECTED   ng/mg creat   O-Desmethyltramadol            >4202        EXPECTED  ng/mg creat   N-Desmethyltramadol            1712         EXPECTED   ng/mg creat    Source of tramadol  is a prescription medication. O-desmethyltramadol    and N-desmethyltramadol are expected metabolites of tramadol .  Drug Absent but Declared for Prescription Verification   Hydrocodone                     Not Detected UNEXPECTED ng/mg creat    Hydrocodone  is almost always present in patients taking this drug    consistently. Absence of hydrocodone  could be due to lapse of time    since the last dose or unusual pharmacokinetics (rapid metabolism).  ==================================================================== Test                      Result     Flag   Units      Ref Range   Creatinine              119              mg/dL      >=16 ==================================================================== Declared Medications:  The flagging and interpretation on this report are based on the  following declared medications.  Unexpected results may arise from  inaccuracies in the declared medications.   **Note: The testing scope of this panel includes these medications:   Hydrocodone   Tramadol    **Note: The testing scope of this panel does not include the  following reported medications:   Acetaminophen   Alendronate (Fosamax)  Aspirin   Calcium   Candesartan  Cholecalciferol   Cyanocobalamin   Cyclobenzaprine   Dulaglutide  Gabapentin   Glipizide   Meclizine  Multivitamin  Nystatin  Ondansetron   Ropinirole   Tolterodine  Warfarin ==================================================================== For clinical consultation, please call [phone]. ====================================================================     No results found for: CBDTHCR No results found for: D8THCCBX No results found for: D9THCCBX  ROS  Constitutional: Denies any fever or chills Gastrointestinal: No reported hemesis, hematochezia, vomiting, or acute GI distress Musculoskeletal: Denies any acute onset joint swelling, redness, loss of ROM, or weakness Neurological: No reported episodes of acute onset apraxia, aphasia, dysarthria, agnosia, amnesia, paralysis, loss of coordination, or loss of consciousness  Medication Review  Dulaglutide, HYDROcodone -acetaminophen , Magnesium Oxide -Mg Supplement, Vitamin B-12, bisacodyl , candesartan, cholecalciferol , cyclobenzaprine , gabapentin , glipiZIDE , glucose blood, omeprazole , ondansetron , osimertinib  mesylate, oxybutynin , potassium chloride  SA, prochlorperazine , rOPINIRole , sucralfate , and warfarin  History Review  Allergy: Ms. Davidovich is allergic to adhesive [tape], avapro  [irbesartan ],  morphine  and codeine , other, and latex. Drug: Ms. Goodpasture  reports no history of drug use. Alcohol :  reports no history of alcohol  use. Tobacco:  reports that she has never smoked. She has been exposed to tobacco smoke. She has never used smokeless tobacco. Social: Ms. Appelhans  reports that she has never smoked. She has been exposed to tobacco smoke. She has never used smokeless tobacco. She reports that she does not drink alcohol  and does not use drugs. Medical:  has a past medical history of Anginal pain (HCC), Arthritis, CHF (congestive heart failure) (HCC), Chronic anticoagulation, Degenerative disc disease, lumbar, Diverticulitis (2018), Diverticulosis, DVT of lower extremity, bilateral (HCC) (2018), Essential hypertension, History of being hospitalized, History of hiatal hernia, Mediastinal adenopathy (04/2023), MRSA nasal colonization, Non-small cell lung cancer (HCC) (2022), Obesity (BMI 30-39.9), Osteoarthritis, Osteoporosis, PE (pulmonary thromboembolism) (HCC) (2018), Pneumonia, Small bowel obstruction (HCC) (03/20/2023), 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HCC), Type 2 diabetes mellitus with obesity (HCC), and Vertigo. Surgical:  Ms. Sendejo  has a past surgical history that includes Knee surgery (Right); Carpal tunnel release (Bilateral); Colon resection sigmoid (N/A, 11/02/2016); Colostomy (N/A, 11/02/2016); Sigmoidoscopy (N/A, 11/13/2016); PULMONARY VENOGRAPHY (N/A, 11/19/2016); Colonoscopy with propofol  (N/A, 02/03/2017); Colon surgery (11/02/2016); IVC FILTER INSERTION (Right, 10/2016); Colostomy reversal (N/A, 02/24/2017); Appendectomy (N/A, 02/24/2017); Lysis of adhesion (N/A, 02/24/2017); laparoscopy (N/A, 02/24/2017); Ileo loop diversion (N/A, 02/24/2017); Sigmoidoscopy (N/A, 05/19/2017); Ileostomy closure (N/A, 06/01/2017); IVC FILTER REMOVAL (N/A, 08/09/2017); Sacroplasty (N/A, 11/08/2017); Cataract extraction w/PHACO (Left, 11/21/2018); Cataract extraction w/PHACO (Right, 12/12/2018);  Video bronchoscopy with endobronchial navigation (N/A, 02/23/2021); Flexible bronchoscopy (N/A, 05/16/2023); Endobronchial ultrasound (N/A, 05/16/2023); and IR IMAGING GUIDED PORT INSERTION (06/14/2023). Family: family history includes Breast cancer in her mother; Cancer in her mother; Diabetes in her mother and sister; Heart disease in her father, mother, and sister.  Laboratory Chemistry Profile   Renal Lab Results  Component Value Date   BUN 12 11/01/2023   CREATININE 0.83 11/01/2023   GFRAA >60 07/09/2019   GFRNONAA >60 11/01/2023    Hepatic Lab Results  Component Value Date   AST 28 11/01/2023   ALT 22 11/01/2023   ALBUMIN 3.6 11/01/2023   ALKPHOS 49 11/01/2023   LIPASE 28 07/06/2023    Electrolytes Lab Results  Component Value Date   NA 137 11/01/2023   K 3.2 (L) 11/01/2023   CL 107 11/01/2023   CALCIUM  8.6 (L) 11/01/2023   MG 2.0 08/30/2023   PHOS 4.0 03/22/2023    Bone Lab Results  Component Value Date   25OHVITD1 25 (L) 11/02/2017   25OHVITD2 <1.0 11/02/2017   25OHVITD3 25 11/02/2017    Inflammation (CRP: Acute Phase) (ESR: Chronic Phase) Lab Results  Component Value Date   CRP 1.7 (H) 08/15/2020   ESRSEDRATE 12 11/02/2017   LATICACIDVEN 1.3 08/27/2023         Note: Above Lab results reviewed.  Recent Imaging Review  CT CHEST ABDOMEN PELVIS W CONTRAST Addendum: ADDENDUM REPORT: 10/07/2023 21:50   ADDENDUM:  Little overall change to prior PET-CT dated 06/03/2023, right  pleural effusion is very slightly diminished and high right  paratracheal nodes are slightly diminished. Treated perihilar mass  is unchanged and repeat PET-CT would be helpful if the primary point  of concern is residual active disease within this treated region.   Electronically Signed    By: Fredricka Jenny M.D.    On: 10/07/2023 21:50 Narrative: CLINICAL DATA:  Lung cancer restaging * Tracking Code: BO *  EXAM: CT CHEST, ABDOMEN, AND PELVIS WITH  CONTRAST  TECHNIQUE: Multidetector CT imaging of the chest, abdomen and pelvis was performed following the standard protocol during bolus administration of intravenous contrast.  RADIATION DOSE REDUCTION: This exam was performed according to the departmental dose-optimization program which includes automated exposure control, adjustment of the mA and/or kV according to patient size and/or use of iterative reconstruction technique.  CONTRAST:  OMNIPAQUE  IOHEXOL  300 MG/ML  SOLN  COMPARISON:  CT chest angiogram, 08/08/2023, CT abdomen pelvis angiogram, 08/02/2023  FINDINGS: CT CHEST FINDINGS  Cardiovascular: Right chest port catheter. Aortic atherosclerosis. Normal heart size. No pericardial effusion.  Mediastinum/Nodes: Unchanged prominent pretracheal and paratracheal lymph nodes (series 2, image 15). Thyroid  gland, trachea, and esophagus demonstrate no significant findings.  Lungs/Pleura: Unchanged post treatment appearance of the right chest with dense, bandlike consolidation and fibrosis of the perihilar right lung (series 3, image 63), with a trace, loculated right pleural effusion and extensive pleural thickening about the right hemithorax. Diffuse mosaic attenuation of the airspaces,  consistent with small airways disease.  Musculoskeletal: No chest wall abnormality. No acute osseous findings.  CT ABDOMEN PELVIS FINDINGS  Hepatobiliary: No solid liver abnormality is seen. Hepatomegaly, maximum coronal span 20.4 cm. Hepatic steatosis. Numerous tiny gallstones contracted in the gallbladder. No gallbladder wall thickening. No biliary ductal dilatation.  Pancreas: Unremarkable. No pancreatic ductal dilatation or surrounding inflammatory changes.  Spleen: Normal in size without significant abnormality.  Adrenals/Urinary Tract: Adrenal glands are unremarkable. Kidneys are normal, without renal calculi, solid lesion, or hydronephrosis. Air-fluid level in the  urinary bladder, presumably secondary to recent catheterization  Stomach/Bowel: Stomach is within normal limits. Appendix appears normal. No evidence of bowel wall thickening, distention, or inflammatory changes. Moderate burden of stool throughout the colon with stool balls in the rectum  Vascular/Lymphatic: Aortic atherosclerosis. No enlarged abdominal or pelvic lymph nodes.  Reproductive: No mass or other abnormality.  Other: Midline epigastric hernia containing nonobstructed transverse colon and mid small bowel (series 2, image 61). No ascites. Pelvic floor prolapse with cystocele.  Musculoskeletal: No acute osseous findings.  IMPRESSION: 1. Unchanged post treatment appearance of the right chest with dense, bandlike consolidation and fibrosis of the perihilar right lung, with a trace pleural effusion and extensive pleural thickening about the right hemithorax. 2. Unchanged prominent mediastinal lymph nodes. 3. No evidence of lymphadenopathy or metastatic disease in the abdomen or pelvis. 4. Hepatomegaly and hepatic steatosis. 5. Cholelithiasis. 6. Midline epigastric hernia containing nonobstructed transverse colon and mid small bowel. 7. Pelvic floor prolapse with cystocele.  Aortic Atherosclerosis (ICD10-I70.0).  Electronically Signed: By: Fredricka Jenny M.D. On: 10/06/2023 20:24 Note: Reviewed         Physical Exam  General appearance: Well nourished, well developed, and well hydrated. In no apparent acute distress Mental status: Alert, oriented x 3 (person, place, & time)       Respiratory: No evidence of acute respiratory distress Eyes: PERLA Vitals: BP 109/68 (Cuff Size: Normal)   Pulse 85   Temp (!) 97.5 F (36.4 C) (Temporal)   Resp 16   Ht 5' 3 (1.6 m)   Wt 187 lb (84.8 kg)   SpO2 99%   BMI 33.13 kg/m  BMI: Estimated body mass index is 33.13 kg/m as calculated from the following:   Height as of this encounter: 5' 3 (1.6 m).   Weight as of this  encounter: 187 lb (84.8 kg). Ideal: Ideal body weight: 52.4 kg (115 lb 8.3 oz) Adjusted ideal body weight: 65.4 kg (144 lb 1.8 oz)  Assessment   Diagnosis Status  1. Chronic low back pain (1ry area of Pain) (Bilateral) w/o sciatica   2. Degeneration of intervertebral disc of lumbosacral region, unspecified whether pain present   3. Lumbar facet arthropathy (L3-4, L4-5, and L5-S1) (Bilateral)   4. Lumbar facet joint pain   5. Lumbar facet syndrome (Bilateral)   6. Postop check   7. Chronic anticoagulation (COUMADIN )    Controlled Controlled Controlled   Updated Problems: No problems updated.  Plan of Care  Problem-specific:  Assessment and Plan    Pain management with radiofrequency   Previous nerve block injections provided effective pain relief. Radiofrequency ablation is reconsidered for extended relief to reduce steroid use. A spinal cord stimulator was discussed as an alternative, including the trial process and MMPI-2 clearance. She will proceed with radiofrequency ablation, starting with the right side, followed by the left side two weeks later. Order radiofrequency ablation PRN. Schedule the right side first, then the left side two weeks later.  Discuss the spinal cord stimulator trial process and requirements, including MMPI-2 clearance.  Cancer treatment with Tagrisso    Radiation and chemotherapy are completed. She is currently on daily Tagrisso  (osimertinib ) for cancer treatment, ordered separately. Plans are in place to review potential interactions with current pain management medications. Review potential drug interactions between Tagrisso  and current pain management medications.       Ms. Ahna Konkle Naval has a current medication list which includes the following long-term medication(s): candesartan, glipizide , omeprazole , potassium chloride  sa, prochlorperazine , ropinirole , and sucralfate .  Pharmacotherapy (Medications Ordered): No orders of the defined types  were placed in this encounter.  Orders:  Orders Placed This Encounter  Procedures   Radiofrequency,Lumbar    Standing Status:   Standing    Number of Occurrences:   1    Expiration Date:   02/02/2024    Scheduling Instructions:     Side(s): Right-sided     Level: L3-4, L4-5, and L5-S1 Facets (L2, L3, L4, L5, and S1 Medial Branch)     Sedation: With Sedation.     Timeframe: Pending approval.    Where will this procedure be performed?:   [name]   Nursing Instructions:    Please complete this patient's postprocedure evaluation.    Scheduling Instructions:     Please complete this patient's postprocedure evaluation.   Blood Thinner Instructions to Nursing    Always make sure patient has clearance from prescribing physician to stop blood thinners for interventional therapies. If the patient requires a Lovenox -bridge therapy, make sure arrangements are made to institute it with the assistance of the PCP.    Scheduling Instructions:     Have Ms. Kressin stop the Coumadin  (Warfarin) X 5 days prior to procedure or surgery.     Interventional Therapies  Risk  Complexity Considerations:   ANTICOAGULATION: Coumadin  (Stop: 5 days  Re-start: 2hrs)  Hx DVT & PE ALLERGY: Latex Morbid obesity (class III) (BMI>40)  metabolic syndrome  NIDDM  HTN Difficult Lumbar RFA due to morbid obesity    Planned  Pending:    Under consideration:   Palliative bilateral lumbar facet MBB [id number]  Therapeutic bilateral lumbar facet RFA #2 (patient indicates preferring to have only the block w/o RFA.)   Completed:   Therapeutic bilateral lumbar facet MBB [id number] (06/21/2023) [facility])  Diagnostic/therapeutic right IA hip inj. x2 (01/28/2022) (100/100/85/85)  Diagnostic/therapeutic left IA hip inj. x1 (01/28/2022) (100/100/85/85)  Diagnostic/therapeutic right trochanteric bursa inj. x1 (09/15/2021) (85/85/85)  Diagnostic/therapeutic left trochanteric bursa inj. x1 (01/28/2022) (100/100/85/85)   Therapeutic left lumbar facet RFA x1 (07/21/2021) (100/100/100 x1 week/80) (Lasted 9 months)  Therapeutic right lumbar facet RFA x1 (07/07/2021) (100/100/80/80) (Lasted 9 months)  Palliative/Therapeutic left L4-5 LESI x1 (11/10/2017) (100/80/0/0-25)  Diagnostic/therapeutic right L4 TFESI x1 (11/19/2021) (90/80/0/0) (fell at [employer]) Palliative right SI joint block x9 (01/28/2022) (100/100/80/80)  Diagnostic left SI joint block x2 (01/28/2022) (100/100/80/80)  Palliative/Therapeutic (Midline) caudal ESI x3 (06/08/2018) (100/100/100 x 2-3 days/0)    Therapeutic  Palliative (PRN) options:   NO PRN procedures without F2F exam by Dr. Anesa Fronek due to multifactorial etiology of chronic pain. No more procedures until after 08/14/2022.    Pharmacotherapy  Nonopioids transferred 03/27/2020: Calcium       Return for (PRN), ( ), (ECT): (R) L-FCT RFA #2, (Blood Thinner Protocol).    Recent Visits Date Type Provider Dept  11/02/23 Office Visit Renaldo Caroli, MD [name]  10/04/23 Procedure visit Renaldo Caroli, MD [name]  09/19/23 Office Visit Renaldo Caroli,  MD [name]  Showing recent visits within past 90 days and meeting all other requirements Future Appointments Date Type Provider Dept  12/27/23 Appointment Renaldo Caroli, MD [name]  Showing future appointments within next 90 days and meeting all other requirements  I discussed the assessment and treatment plan with the patient. The patient was provided an opportunity to ask questions and all were answered. The patient agreed with the plan and demonstrated an understanding of the instructions.  Patient advised to call back or seek an in-person evaluation if the symptoms or condition worsens.  Duration of encounter: 30 minutes.  Total time on encounter, as per AMA guidelines included both the face-to-face and non-face-to-face time personally spent by the physician  and/or other qualified health care professional(s) on the day of the encounter (includes time in activities that require the physician or other qualified health care professional and does not include time in activities normally performed by clinical staff). Physician's time may include the following activities when performed: Preparing to see the patient (e.g., pre-charting review of records, searching for previously ordered imaging, lab work, and nerve conduction tests) Review of prior analgesic pharmacotherapies. Reviewing PMP Interpreting ordered tests (e.g., lab work, imaging, nerve conduction tests) Performing post-procedure evaluations, including interpretation of diagnostic procedures Obtaining and/or reviewing separately obtained history Performing a medically appropriate examination and/or evaluation Counseling and educating the patient/family/caregiver Ordering medications, tests, or procedures Referring and communicating with other health care professionals (when not separately reported) Documenting clinical information in the electronic or other health record Independently interpreting results (not separately reported) and communicating results to the patient/ family/caregiver Care coordination (not separately reported)  Note by: Candi Chafe, MD (TTS and AI technology used. I apologize for any typographical errors that were not detected and corrected.) Date: 11/02/2023; Time: 8:07 AM

## 2023-11-01 NOTE — Telephone Encounter (Signed)
 Patient would like to have port removed. Placed 06/14/23 by Melven Stable. Emanuel Handy, MD.  Referral sent to IR to have port removed.  Will follow up to make sure this has been scheduled.

## 2023-11-01 NOTE — Telephone Encounter (Signed)
 Per Dr. Randy Buttery "her K is low. is she taking po K".  Outbound call to patient;

## 2023-11-01 NOTE — Progress Notes (Signed)
 Clinical Pharmacist Practitioner [facility]  [phone] [phone]  Patient Care Team: Antonio Baumgarten, MD as PCP - General (Internal Medicine) Drake Gens, RN as Oncology Nurse Navigator Avonne Boettcher, MD as Consulting Physician (Oncology)   Name of the patient: Kara [name]  [id number]  13-Mar-1951   Date of visit: 11/01/23  HPI: Patient is a 73 y.o. 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 Patient has completed chemoradiation and the plan is to now proceed with osimertinib  therapy until disease progression or unacceptable drug toxicity. Patient started her osimertinib  ~ 10/18/23.  Reason for Consult: Oral chemotherapy follow-up for osimertinib  therapy.   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, 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   CHF (congestive heart failure) (HCC)    followed colon resection, "wouldn't let me get up"   Chronic anticoagulation    Degenerative disc disease, lumbar    Diverticulitis 2018   Diverticulosis    DVT of lower extremity, bilateral (HCC) 2018   Essential hypertension    History of being hospitalized    1 month ago for 3 days, vomiting, and kink in upper intestine   History of hiatal hernia    Mediastinal adenopathy 04/2023   MRSA nasal colonization    Non-small cell lung cancer (HCC) 2022   right   Obesity (BMI 30-39.9)    Osteoarthritis    Osteoporosis    PE (pulmonary thromboembolism) (HCC) 2018   Pneumonia    Small bowel obstruction (HCC) 03/20/2023   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HCC)    Type 2 diabetes mellitus with obesity (HCC)    Vertigo     HEMATOLOGY/ONCOLOGY HISTORY:  Oncology History  Cancer of upper lobe of right lung (HCC)  05/27/2023 Initial Diagnosis   Cancer of upper lobe of right lung (HCC)   07/01/2023 -  Chemotherapy   Patient is on Treatment Plan :  LUNG Carboplatin  + Paclitaxel  + XRT q7d       ALLERGIES:  is allergic to adhesive [tape], avapro  [irbesartan ], morphine  and codeine , other, and latex.  MEDICATIONS:  Current Outpatient Medications  Medication Sig Dispense Refill   bisacodyl  (DULCOLAX) 10 MG suppository Place 1 suppository (10 mg total) rectally as needed for moderate constipation. 12 suppository 0   candesartan (ATACAND) 16 MG tablet Take 16 mg by mouth in the morning.     cholecalciferol  (VITAMIN D ) 25 MCG (1000 UNIT) tablet Take 1,000 Units by mouth in the morning.     Cyanocobalamin  (VITAMIN B-12) 5000 MCG TBDP Take 5,000 mcg by mouth in the morning.     cyclobenzaprine  (FLEXERIL ) 10 MG tablet Take 10 mg by mouth at bedtime.     Dulaglutide (TRULICITY) 1.5 MG/0.5ML SOAJ Inject 1.5 mg into the skin once a week. Saturday     gabapentin  (NEURONTIN ) 100 MG capsule Take 200 mg by mouth with breakfast, with lunch, and with evening meal.     gabapentin  (NEURONTIN ) 300 MG capsule Take 300 mg by mouth at bedtime.     glipiZIDE  (GLUCOTROL ) 10 MG tablet Take 10 mg by mouth daily before breakfast.     HYDROcodone -acetaminophen  (NORCO/VICODIN) 5-325 MG tablet Take 1 tablet by mouth every 6 (six) hours as needed for moderate pain.     Magnesium Oxide -Mg Supplement 500 MG TABS Take 1 tablet by mouth at bedtime.     omeprazole  (  PRILOSEC) 20 MG capsule Take 1 capsule (20 mg total) by mouth daily. 90 capsule 0   ondansetron  (ZOFRAN ) 8 MG tablet TAKE 1 TABLET BY MOUTH EVERY 8 HOURS AS NEEDED FOR NAUSEA OR VOMITING. START ON THE 3RD DAY AFTER CHEMOTHERAPY 30 tablet 1   ONETOUCH ULTRA TEST test strip      osimertinib  mesylate (TAGRISSO ) 80 MG tablet Take 1 tablet (80 mg total) by mouth daily.     oxybutynin  (DITROPAN ) 5 MG tablet Take 1 tablet by mouth 2 (two) times daily.     potassium chloride  SA (KLOR-CON  M) 20 MEQ tablet Take 1 tablet (20 mEq total) by mouth daily. 30 tablet 1   prochlorperazine  (COMPAZINE ) 10 MG tablet Take 1 tablet  (10 mg total) by mouth every 6 (six) hours as needed for nausea or vomiting. 90 tablet 0   rOPINIRole  (REQUIP ) 1 MG tablet Take 1 mg by mouth at bedtime.     sucralfate  (CARAFATE ) 1 g tablet Take 1 tablet (1 g total) by mouth 3 (three) times daily with meals. Dissolve tablet in 4 tablesppons warm water swish and swallow 30 min before meals. (Patient taking differently: Take 1 g by mouth 3 (three) times daily with meals. Dissolve tablet in 4 tablespoons warm water swish and swallow 30 min before meals.) 90 tablet 0   warfarin (COUMADIN ) 6 MG tablet Take 6 mg by mouth at bedtime.     No current facility-administered medications for this visit.    VITAL SIGNS: There were no vitals taken for this visit. There were no vitals filed for this visit.  Estimated body mass index is 33.13 kg/m as calculated from the following:   Height as of 10/10/23: 5\' 3"  (1.6 m).   Weight as of an earlier encounter on 11/01/23: 84.8 kg (187 lb).  LABS: CBC:    Component Value Date/Time   WBC 3.2 (L) 11/01/2023 1440   WBC 2.4 (L) 08/30/2023 0936   HGB 12.1 11/01/2023 1440   HGB 13.9 05/30/2013 0520   HCT 38.4 11/01/2023 1440   HCT 40.8 05/30/2013 0520   PLT 78 (L) 11/01/2023 1440   PLT 181 05/30/2013 0520   MCV 98.2 11/01/2023 1440   MCV 92 05/30/2013 0520   NEUTROABS 2.4 11/01/2023 1440   NEUTROABS 4.1 05/30/2013 0520   LYMPHSABS 0.5 (L) 11/01/2023 1440   LYMPHSABS 0.9 (L) 05/30/2013 0520   MONOABS 0.3 11/01/2023 1440   MONOABS 0.4 05/30/2013 0520   EOSABS 0.0 11/01/2023 1440   EOSABS 0.0 05/30/2013 0520   BASOSABS 0.0 11/01/2023 1440   BASOSABS 0.0 05/30/2013 0520   Comprehensive Metabolic Panel:    Component Value Date/Time   NA 137 11/01/2023 1440   NA 139 05/30/2013 0520   K 3.2 (L) 11/01/2023 1440   K 3.6 05/30/2013 0520   CL 107 11/01/2023 1440   CL 108 (H) 05/30/2013 0520   CO2 23 11/01/2023 1440   CO2 27 05/30/2013 0520   BUN 12 11/01/2023 1440   BUN 11 05/30/2013 0520   CREATININE  0.83 11/01/2023 1440   CREATININE 0.90 05/30/2013 0520   GLUCOSE 160 (H) 11/01/2023 1440   GLUCOSE 98 05/30/2013 0520   CALCIUM  8.6 (L) 11/01/2023 1440   CALCIUM  7.9 (L) 05/30/2013 0520   AST 28 11/01/2023 1440   ALT 22 11/01/2023 1440   ALT 54 05/29/2013 0631   ALKPHOS 49 11/01/2023 1440   ALKPHOS 64 05/29/2013 0631   BILITOT 0.5 11/01/2023 1440   PROT 6.8 11/01/2023 1440  PROT 8.2 05/29/2013 0631   ALBUMIN 3.6 11/01/2023 1440   ALBUMIN 3.8 05/29/2013 0631     Present during today's visit: patient only  Assessment and Plan: CMP/CBC reviewed, pltc decreased continue to monitor Continue osimertinib  80mg  daily   Oral Chemotherapy Side Effect/Intolerance:  No reported rash, diarrhea, mouth sores, or nail changes  Oral Chemotherapy Adherence: No missed doses reported No patient barriers to medication adherence identified.   New medications: None reported  Medication Access Issues: No issues, patient knows to call [facility] for medication refills  Patient expressed understanding and was in agreement with this plan. She also understands that She can call clinic at any time with any questions, concerns, or complaints.   Follow-up plan: RTC as scheduled  Thank you for allowing me to participate in the care of this very pleasant patient.   Time Total: 10 mins  Visit consisted of counseling and education on dealing with issues of symptom management in the setting of serious and potentially life-threatening illness.Greater than 50%  of this time was spent counseling and coordinating care related to the above assessment and plan.  Signed by: Harue Pribble N. Belmont Valli, PharmD, Lorraine Roses, CPP Hematology/Oncology Clinical Pharmacist Practitioner [place] [phone]  11/01/2023 3:22 PM

## 2023-11-02 ENCOUNTER — Encounter: Payer: Self-pay | Admitting: Pain Medicine

## 2023-11-02 ENCOUNTER — Encounter: Payer: Self-pay | Admitting: Oncology

## 2023-11-02 ENCOUNTER — Ambulatory Visit: Attending: Pain Medicine | Admitting: Pain Medicine

## 2023-11-02 ENCOUNTER — Encounter: Payer: Self-pay | Admitting: Internal Medicine

## 2023-11-02 VITALS — BP 109/68 | HR 85 | Temp 97.5°F | Resp 16 | Ht 63.0 in | Wt 187.0 lb

## 2023-11-02 DIAGNOSIS — M5459 Other low back pain: Secondary | ICD-10-CM | POA: Insufficient documentation

## 2023-11-02 DIAGNOSIS — M545 Low back pain, unspecified: Secondary | ICD-10-CM | POA: Insufficient documentation

## 2023-11-02 DIAGNOSIS — M51379 Other intervertebral disc degeneration, lumbosacral region without mention of lumbar back pain or lower extremity pain: Secondary | ICD-10-CM | POA: Diagnosis not present

## 2023-11-02 DIAGNOSIS — Z09 Encounter for follow-up examination after completed treatment for conditions other than malignant neoplasm: Secondary | ICD-10-CM | POA: Insufficient documentation

## 2023-11-02 DIAGNOSIS — M47816 Spondylosis without myelopathy or radiculopathy, lumbar region: Secondary | ICD-10-CM | POA: Insufficient documentation

## 2023-11-02 DIAGNOSIS — G8929 Other chronic pain: Secondary | ICD-10-CM | POA: Diagnosis not present

## 2023-11-02 DIAGNOSIS — Z7901 Long term (current) use of anticoagulants: Secondary | ICD-10-CM | POA: Diagnosis not present

## 2023-11-02 NOTE — Progress Notes (Signed)
 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 She was in remission until November 2024 when she was noted to have hypermetabolic in the right hilum as well as mediastinal region. 15 x 12 mm nodular opacity in the dome of the right diaphragm. 10 mm left cervical lymph node. 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 Right pleural thoracentesis negative for malignancy.  Patient completed concurrent chemoradiation with weekly CarboTaxol in March 2025.  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  MSI not high.  CT DNA tumor fraction 7%  CT scans after concurrent chemoradiation showed stable disease with stable mediastinal adenopathy asWell as postradiation changes noted in the perihilar right lung.  She will be on adjuvant Tagrisso  starting May 2025    Interval history-patient has been on Tagrisso  for 2 weeks now and so far tolerating it well without any skin rash or diarrhea.  She has some baseline fatigue and chronic nonmalignant back pain which is overall stable.  ECOG PS- 1 Pain scale- 3   Review of systems- ROS    Allergies  Allergen Reactions    Adhesive [Tape] Other (See Comments)    Pt reports, when removing tape from skin most tapes pull her skin off. Ok to use paper tape   Avapro  [Irbesartan ] Anaphylaxis    Facial swelling   Morphine  And Codeine  Nausea And Vomiting   Other Hives    Chlorhexadine wipes/CHG wipes,    Latex Rash    Exam gloves/ does not react around elastic and lips don't swell when blowing balloons     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, 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   CHF (congestive heart failure) (HCC)    followed colon resection, wouldn't let me get up   Chronic anticoagulation    Degenerative disc disease, lumbar    Diverticulitis 2018   Diverticulosis    DVT of lower extremity, bilateral (HCC) 2018   Essential hypertension    History of being hospitalized    1 month ago for 3 days, vomiting, and kink in upper intestine   History of hiatal hernia    Mediastinal adenopathy 04/2023   MRSA nasal colonization    Non-small cell lung cancer (HCC) 2022   right   Obesity (BMI 30-39.9)    Osteoarthritis    Osteoporosis    PE (pulmonary thromboembolism) (HCC) 2018   Pneumonia    Small bowel obstruction (HCC) 03/20/2023   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HCC)    Type 2 diabetes mellitus with obesity (HCC)    Vertigo      Past Surgical History:  Procedure Laterality Date  APPENDECTOMY N/A 02/24/2017   Procedure: Incidental  APPENDECTOMY;  Surgeon: Gwyndolyn Lerner, MD;  Location: [name];  Service: General;  Laterality: N/A;   CARPAL TUNNEL RELEASE Bilateral    CATARACT EXTRACTION W/PHACO Left 11/21/2018   Procedure: CATARACT EXTRACTION PHACO AND INTRAOCULAR LENS PLACEMENT (IOC) LEFT DIABETES;  Surgeon: Clair Crews, MD;  Location: [facility];  Service: Ophthalmology;  Laterality: Left;  latex sensitivity Diabetic - oral meds   CATARACT EXTRACTION W/PHACO Right 12/12/2018   Procedure: CATARACT EXTRACTION  PHACO AND INTRAOCULAR LENS PLACEMENT (IOC) RIGHT DIABETES;  Surgeon: Clair Crews, MD;  Location: [facility];  Service: Ophthalmology;  Laterality: Right;  Diabetes-oral med Latex sensitiviy   COLON RESECTION SIGMOID N/A 11/02/2016   Procedure: COLON RESECTION SIGMOID;  Surgeon: Gwyndolyn Lerner, MD;  Location: [name];  Service: General;  Laterality: N/A;   COLON SURGERY  11/02/2016   COLONOSCOPY WITH PROPOFOL  N/A 02/03/2017   Procedure: COLONOSCOPY WITH PROPOFOL ;  Surgeon: Selena Daily, MD;  Location: [name];  Service: Gastroenterology;  Laterality: N/A;   COLOSTOMY N/A 11/02/2016   Procedure: COLOSTOMY;  Surgeon: Gwyndolyn Lerner, MD;  Location: [name];  Service: General;  Laterality: N/A;   COLOSTOMY REVERSAL N/A 02/24/2017   Procedure: COLOSTOMY REVERSAL, ostomy takedown, spleenic flexure mobilization, excision rectal stump/distal sigmoid, anastomosis with suture reinforcement;  Surgeon: Gwyndolyn Lerner, MD;  Location: [name];  Service: General;  Laterality: N/A;   ENDOBRONCHIAL ULTRASOUND N/A 05/16/2023   Procedure: ENDOBRONCHIAL ULTRASOUND;  Surgeon: Marc Senior, MD;  Location: [name];  Service: Cardiopulmonary;  Laterality: N/A;   FLEXIBLE BRONCHOSCOPY N/A 05/16/2023   Procedure: FLEXIBLE BRONCHOSCOPY;  Surgeon: Marc Senior, MD;  Location: [name];  Service: Cardiopulmonary;  Laterality: N/A;   ILEO LOOP DIVERSION N/A 02/24/2017   Procedure: ILEO LOOP COLOSTOMY;  Surgeon: Gwyndolyn Lerner, MD;  Location: [name];  Service: General;  Laterality: N/A;   ILEOSTOMY CLOSURE N/A 06/01/2017   Procedure: LOOP ILEOSTOMY TAKEDOWN;  Surgeon: Gwyndolyn Lerner, MD;  Location: [name];  Service: General;  Laterality: N/A;   IR IMAGING GUIDED PORT INSERTION  06/14/2023   IVC FILTER INSERTION Right 10/2016   IVC FILTER REMOVAL N/A 08/09/2017   Procedure: IVC FILTER REMOVAL;  Surgeon: Jackquelyn Mass, MD;  Location: [name];  Service:  Cardiovascular;  Laterality: N/A;   KNEE SURGERY Right    torn menicus   LAPAROSCOPY N/A 02/24/2017   Procedure: LAPAROSCOPY DIAGNOSTIC;  Surgeon: Gwyndolyn Lerner, MD;  Location: [name];  Service: General;  Laterality: N/A;   LYSIS OF ADHESION N/A 02/24/2017   Procedure: LYSIS OF ADHESION;  Surgeon: Gwyndolyn Lerner, MD;  Location: [name];  Service: General;  Laterality: N/A;   PULMONARY VENOGRAPHY N/A 11/19/2016   Procedure: Pulmonary Venography; IVC filter placement; possible pulmonary thrombectomy;  Surgeon: Jackquelyn Mass, MD;  Location: [name];  Service: Cardiovascular;  Laterality: N/A;   SACROPLASTY N/A 11/08/2017   Procedure: SACROPLASTY S2;  Surgeon: Molli Angelucci, MD;  Location: [name];  Service: Orthopedics;  Laterality: N/A;   SIGMOIDOSCOPY N/A 11/13/2016   Procedure: endoscopic  flexible SIGMOIDOSCOPY;  Surgeon: Claudia Cuff, MD;  Location: [name];  Service: General;  Laterality: N/A;   SIGMOIDOSCOPY N/A 05/19/2017   Procedure: Marlynn Singer;  Surgeon: Gwyndolyn Lerner, MD;  Location: [name];  Service: General;  Laterality: N/A;   VIDEO BRONCHOSCOPY WITH ENDOBRONCHIAL NAVIGATION N/A 02/23/2021   Procedure: ROBOTIC VIDEO BRONCHOSCOPY WITH ENDOBRONCHIAL NAVIGATION;  Surgeon: Marc Senior, MD;  Location: [name];  Service: Pulmonary;  Laterality: N/A;    Social History   Socioeconomic History   Marital status: Married    Spouse name: Vallorie Gayer   Number of children: 1   Years of education: Not on file   Highest education level: Not on file  Occupational History   Not on file  Tobacco Use   Smoking status: Never    Passive exposure: Current (husband smokes)   Smokeless tobacco: Never  Vaping Use   Vaping status: Never Used  Substance and Sexual Activity   Alcohol  use: No    Alcohol /week: 0.0 standard drinks of alcohol    Drug use: No   Sexual activity: Never  Other Topics Concern   Not on file  Social History Narrative    Not on file   Social Drivers of Health   Financial Resource Strain: Low Risk  (05/19/2023)   Received from [facility]   Overall Financial Resource Strain (CARDIA)    Difficulty of Paying Living Expenses: Not hard at all  Food Insecurity: No Food Insecurity (05/19/2023)   Received from [facility]   Hunger Vital Sign    Worried About Running Out of Food in the Last Year: Never true    Ran Out of Food in the Last Year: Never true  Transportation Needs: No Transportation Needs (05/19/2023)   Received from [facility] - Transportation    In the past 12 months, has lack of transportation kept you from medical appointments or from getting medications?: No    Lack of Transportation (Non-Medical): No  Physical Activity: Not on file  Stress: Not on file  Social Connections: Not on file  Intimate Partner Violence: Not At Risk (05/09/2023)   Humiliation, Afraid, Rape, and Kick questionnaire    Fear of Current or Ex-Partner: No    Emotionally Abused: No    Physically Abused: No    Sexually Abused: No    Family History  Problem Relation Age of Onset   Diabetes Mother    Heart disease Mother    Cancer Mother    Breast cancer Mother        >50   Heart disease Father    Diabetes Sister    Heart disease Sister      Current Outpatient Medications:    bisacodyl  (DULCOLAX) 10 MG suppository, Place 1 suppository (10 mg total) rectally as needed for moderate constipation., Disp: 12 suppository, Rfl: 0   candesartan (ATACAND) 16 MG tablet, Take 16 mg by mouth in the morning., Disp: , Rfl:    cholecalciferol  (VITAMIN D ) 25 MCG (1000 UNIT) tablet, Take 1,000 Units by mouth in the morning., Disp: , Rfl:    Cyanocobalamin  (VITAMIN B-12) 5000 MCG TBDP, Take 5,000 mcg by mouth in the morning., Disp: , Rfl:    cyclobenzaprine  (FLEXERIL ) 10 MG tablet, Take 10 mg by mouth at bedtime., Disp: , Rfl:    Dulaglutide (TRULICITY) 1.5 MG/0.5ML  SOAJ, Inject 1.5 mg into the skin once a week. Saturday, Disp: , Rfl:    gabapentin  (NEURONTIN ) 100 MG capsule, Take 200 mg by mouth with breakfast, with lunch, and with evening meal., Disp: , Rfl:    gabapentin  (NEURONTIN ) 300 MG capsule, Take 300 mg by mouth at bedtime., Disp: , Rfl:    glipiZIDE  (GLUCOTROL ) 10 MG tablet, Take 10 mg by mouth daily before breakfast., Disp: , Rfl:    HYDROcodone -acetaminophen  (NORCO/VICODIN) 5-325 MG tablet, Take 1 tablet by mouth  every 6 (six) hours as needed for moderate pain., Disp: , Rfl:    Magnesium Oxide -Mg Supplement 500 MG TABS, Take 1 tablet by mouth at bedtime., Disp: , Rfl:    omeprazole  (PRILOSEC) 20 MG capsule, Take 1 capsule (20 mg total) by mouth daily., Disp: 90 capsule, Rfl: 0   ondansetron  (ZOFRAN ) 8 MG tablet, TAKE 1 TABLET BY MOUTH EVERY 8 HOURS AS NEEDED FOR NAUSEA OR VOMITING. START ON THE 3RD DAY AFTER CHEMOTHERAPY, Disp: 30 tablet, Rfl: 1   ONETOUCH ULTRA TEST test strip, , Disp: , Rfl:    osimertinib  mesylate (TAGRISSO ) 80 MG tablet, Take 1 tablet (80 mg total) by mouth daily., Disp: , Rfl:    oxybutynin  (DITROPAN ) 5 MG tablet, Take 1 tablet by mouth 2 (two) times daily., Disp: , Rfl:    potassium chloride  SA (KLOR-CON  M) 20 MEQ tablet, Take 1 tablet (20 mEq total) by mouth daily., Disp: 30 tablet, Rfl: 1   prochlorperazine  (COMPAZINE ) 10 MG tablet, Take 1 tablet (10 mg total) by mouth every 6 (six) hours as needed for nausea or vomiting., Disp: 90 tablet, Rfl: 0   rOPINIRole  (REQUIP ) 1 MG tablet, Take 1 mg by mouth at bedtime., Disp: , Rfl:    warfarin (COUMADIN ) 6 MG tablet, Take 6 mg by mouth at bedtime., Disp: , Rfl:    sucralfate  (CARAFATE ) 1 g tablet, Take 1 tablet (1 g total) by mouth 3 (three) times daily with meals. Dissolve tablet in 4 tablesppons warm water swish and swallow 30 min before meals. (Patient taking differently: Take 1 g by mouth 3 (three) times daily with meals. Dissolve tablet in 4 tablespoons warm water swish and  swallow 30 min before meals.), Disp: 90 tablet, Rfl: 0  Physical exam:  Vitals:   11/01/23 1357  BP: 125/70  Pulse: 95  Resp: 15  Temp: 97.8 F (36.6 C)  TempSrc: Tympanic  SpO2: 96%  Weight: 187 lb (84.8 kg)   Physical Exam Constitutional:      Comments: Sitting in a wheelchair and appears in no acute distress  Cardiovascular:     Rate and Rhythm: Normal rate and regular rhythm.     Heart sounds: Normal heart sounds.  Pulmonary:     Effort: Pulmonary effort is normal.     Breath sounds: Normal breath sounds.  Skin:    General: Skin is warm and dry.  Neurological:     Mental Status: She is alert and oriented to person, place, and time.      I have personally reviewed labs listed below:    Latest Ref Rng & Units 11/01/2023    2:40 PM  CMP  Glucose 70 - 99 mg/dL 161   BUN 8 - 23 mg/dL 12   Creatinine 0.96 - 1.00 mg/dL 0.45   Sodium 409 - 811 mmol/L 137   Potassium 3.5 - 5.1 mmol/L 3.2   Chloride 98 - 111 mmol/L 107   CO2 22 - 32 mmol/L 23   Calcium  8.9 - 10.3 mg/dL 8.6   Total Protein 6.5 - 8.1 g/dL 6.8   Total Bilirubin 0.0 - 1.2 mg/dL 0.5   Alkaline Phos 38 - 126 U/L 49   AST 15 - 41 U/L 28   ALT 0 - 44 U/L 22       Latest Ref Rng & Units 11/01/2023    2:40 PM  CBC  WBC 4.0 - 10.5 K/uL 3.2   Hemoglobin 12.0 - 15.0 g/dL 91.4   Hematocrit 78.2 -  46.0 % 38.4   Platelets 150 - 400 K/uL 78    I have personally reviewed Radiology images listed below: No images are attached to the encounter.  CT CHEST ABDOMEN PELVIS W CONTRAST Addendum Date: 10/07/2023 ADDENDUM REPORT: 10/07/2023 21:50 ADDENDUM: Little overall change to prior PET-CT dated 06/03/2023, right pleural effusion is very slightly diminished and high right paratracheal nodes are slightly diminished. Treated perihilar mass is unchanged and repeat PET-CT would be helpful if the primary point of concern is residual active disease within this treated region. Electronically Signed   By: Fredricka Jenny M.D.    On: 10/07/2023 21:50   Result Date: 10/07/2023 CLINICAL DATA:  Lung cancer restaging * Tracking Code: BO * EXAM: CT CHEST, ABDOMEN, AND PELVIS WITH CONTRAST TECHNIQUE: Multidetector CT imaging of the chest, abdomen and pelvis was performed following the standard protocol during bolus administration of intravenous contrast. RADIATION DOSE REDUCTION: This exam was performed according to the departmental dose-optimization program which includes automated exposure control, adjustment of the mA and/or kV according to patient size and/or use of iterative reconstruction technique. CONTRAST:  OMNIPAQUE  IOHEXOL  300 MG/ML  SOLN COMPARISON:  CT chest angiogram, 08/08/2023, CT abdomen pelvis angiogram, 08/02/2023 FINDINGS: CT CHEST FINDINGS Cardiovascular: Right chest port catheter. Aortic atherosclerosis. Normal heart size. No pericardial effusion. Mediastinum/Nodes: Unchanged prominent pretracheal and paratracheal lymph nodes (series 2, image 15). Thyroid  gland, trachea, and esophagus demonstrate no significant findings. Lungs/Pleura: Unchanged post treatment appearance of the right chest with dense, bandlike consolidation and fibrosis of the perihilar right lung (series 3, image 63), with a trace, loculated right pleural effusion and extensive pleural thickening about the right hemithorax. Diffuse mosaic attenuation of the airspaces, consistent with small airways disease. Musculoskeletal: No chest wall abnormality. No acute osseous findings. CT ABDOMEN PELVIS FINDINGS Hepatobiliary: No solid liver abnormality is seen. Hepatomegaly, maximum coronal span 20.4 cm. Hepatic steatosis. Numerous tiny gallstones contracted in the gallbladder. No gallbladder wall thickening. No biliary ductal dilatation. Pancreas: Unremarkable. No pancreatic ductal dilatation or surrounding inflammatory changes. Spleen: Normal in size without significant abnormality. Adrenals/Urinary Tract: Adrenal glands are unremarkable. Kidneys are normal,  without renal calculi, solid lesion, or hydronephrosis. Air-fluid level in the urinary bladder, presumably secondary to recent catheterization Stomach/Bowel: Stomach is within normal limits. Appendix appears normal. No evidence of bowel wall thickening, distention, or inflammatory changes. Moderate burden of stool throughout the colon with stool balls in the rectum Vascular/Lymphatic: Aortic atherosclerosis. No enlarged abdominal or pelvic lymph nodes. Reproductive: No mass or other abnormality. Other: Midline epigastric hernia containing nonobstructed transverse colon and mid small bowel (series 2, image 61). No ascites. Pelvic floor prolapse with cystocele. Musculoskeletal: No acute osseous findings. IMPRESSION: 1. Unchanged post treatment appearance of the right chest with dense, bandlike consolidation and fibrosis of the perihilar right lung, with a trace pleural effusion and extensive pleural thickening about the right hemithorax. 2. Unchanged prominent mediastinal lymph nodes. 3. No evidence of lymphadenopathy or metastatic disease in the abdomen or pelvis. 4. Hepatomegaly and hepatic steatosis. 5. Cholelithiasis. 6. Midline epigastric hernia containing nonobstructed transverse colon and mid small bowel. 7. Pelvic floor prolapse with cystocele. Aortic Atherosclerosis (ICD10-I70.0). Electronically Signed: By: Fredricka Jenny M.D. On: 10/06/2023 20:24   DG PAIN CLINIC C-ARM 1-60 MIN NO REPORT Result Date: 10/04/2023 Fluoro was used, but no Radiologist interpretation will be provided. Please refer to NOTES tab for provider progress note.    Assessment and plan- Patient is a 73 y.o. adult with history of right upper lobe  lung cancer stage I s/p SBRT with mediastinal recurrence in December 2024 s/p concurrent chemoradiation here to discuss CT scan results and further management  I have reviewed CT Chest abdomen pelvis images independently and discussed findings with the patient.  I previously called the  patient and told her the results over the phone as well.  CT scans overall shows stable findings after completing concurrent chemoradiation with postradiation changes noted in the perihilar right lung and stable prominent mediastinal lymph nodes.  No evidence of progressive disease.  She has completed concurrent chemoradiation and given that liquid biopsy testing showed EGFR mutation we are proceeding with adjuvant Tagrisso  at this time.Patient has been on Tagrisso  for 2 weeks so far and tolerating it well without any significant side effects.  She will be getting echocardiogram later this month as well.  Plan is to continue Tagrisso  until 3 years or progression or toxicity.  Today's labs are suggestive of thrombocytopenia.  Her platelets were 169 on 10/03/2023 and they are presently low at 78.  This can be seen in the setting of Tagrisso .  I will see her back in 1 month's time with repeat labs and if platelet counts are less than 50 I will consider reducing the dose of Tagrisso  to 40 mg daily   Visit Diagnosis 1. Cancer of upper lobe of right lung (HCC)   2. Port-A-Cath in place   3. High risk medication use   4. Drug-induced thrombocytopenia      Dr. Seretha Dance, MD, MPH [facility] [phone] 11/02/2023 7:56 AM

## 2023-11-02 NOTE — Patient Instructions (Signed)
 Procedure instructions  Do not eat or drink fluids (other than water) for 6 hours before your procedure  No water for 2 hours before your procedure  Take your blood pressure medicine with a sip of water  Arrive 30 minutes before your appointment  Carefully read the "Preparing for your procedure" detailed instructions  If you have questions call us at [phone]  _____________________________________________________________________    ______________________________________________________________________  Preparing for your procedure  Appointments: If you think you may not be able to keep your appointment, call 24-48 hours in advance to cancel. We need time to make it available to others.  During your procedure appointment there will be: No Prescription Refills. No disability issues to discussed. No medication changes or discussions.  Instructions: Food intake: Avoid eating anything solid for at least 8 hours prior to your procedure. Clear liquid intake: You may take clear liquids such as water up to 2 hours prior to your procedure. (No carbonated drinks. No soda.) Transportation: Unless otherwise stated by your physician, bring a driver. Morning Medicines: Except for blood thinners, take all of your other morning medications with a sip of water. Make sure to take your heart and blood pressure medicines. If your blood pressure's lower number is above 100, the case will be rescheduled. Blood thinners: Make sure to stop your blood thinners as instructed.  If you take a blood thinner, but were not instructed to stop it, call our office [phone] and ask to talk to a nurse. Not stopping a blood thinner prior to certain procedures could lead to serious complications. Diabetics on insulin: Notify the staff so that you can be scheduled 1st case in the morning. If your diabetes requires high dose insulin, take only  of your normal insulin dose the morning of the procedure and  notify the staff that you have done so. Preventing infections: Shower with an antibacterial soap the morning of your procedure.  Build-up your immune system: Take 1000 mg of Vitamin C with every meal (3 times a day) the day prior to your procedure. Antibiotics: Inform the nursing staff if you are taking any antibiotics or if you have any conditions that may require antibiotics prior to procedures. (Example: recent joint implants)   Pregnancy: If you are pregnant make sure to notify the nursing staff. Not doing so may result in injury to the fetus, including death.  Sickness: If you have a cold, fever, or any active infections, call and cancel or reschedule your procedure. Receiving steroids while having an infection may result in complications. Arrival: You must be in the facility at least 30 minutes prior to your scheduled procedure. Tardiness: Your scheduled time is also the cutoff time. If you do not arrive at least 15 minutes prior to your procedure, you will be rescheduled.  Children: Do not bring any children with you. Make arrangements to keep them home. Dress appropriately: There is always a possibility that your clothing may get soiled. Avoid long dresses. Valuables: Do not bring any jewelry or valuables.  Reasons to call and reschedule or cancel your procedure: (Following these recommendations will minimize the risk of a serious complication.) Surgeries: Avoid having procedures within 2 weeks of any surgery. (Avoid for 2 weeks before or after any surgery). Flu Shots: Avoid having procedures within 2 weeks of a flu shots or . (Avoid for 2 weeks before or after immunizations). Barium: Avoid having a procedure within 7-10 days after having had a radiological study involving the use of radiological contrast. (  Myelograms, Barium swallow or enema study). Heart attacks: Avoid any elective procedures or surgeries for the initial 6 months after a "Myocardial Infarction" (Heart Attack). Blood  thinners: It is imperative that you stop these medications before procedures. Let us know if you if you take any blood thinner.  Infection: Avoid procedures during or within two weeks of an infection (including chest colds or gastrointestinal problems). Symptoms associated with infections include: Localized redness, fever, chills, night sweats or profuse sweating, burning sensation when voiding, cough, congestion, stuffiness, runny nose, sore throat, diarrhea, nausea, vomiting, cold or Flu symptoms, recent or current infections. It is specially important if the infection is over the area that we intend to treat. Heart and lung problems: Symptoms that may suggest an active cardiopulmonary problem include: cough, chest pain, breathing difficulties or shortness of breath, dizziness, ankle swelling, uncontrolled high or unusually low blood pressure, and/or palpitations. If you are experiencing any of these symptoms, cancel your procedure and contact your primary care physician for an evaluation.  Remember:  Regular Business hours are:  Monday to Thursday 8:00 AM to 4:00 PM  Provider's Schedule: Delano Metz, MD:  Procedure days: Tuesday and Thursday 7:30 AM to 4:00 PM  Edward Jolly, MD:  Procedure days: Monday and Wednesday 7:30 AM to 4:00 PM

## 2023-11-03 ENCOUNTER — Telehealth: Payer: Self-pay | Admitting: *Deleted

## 2023-11-03 NOTE — Telephone Encounter (Signed)
 Per Leary Provencal I can put her on Wed the 25th. I just am not sure of the time yet. I can let you know the first of next week if that is ok. I kinda of plug those appts in around my sedation cases since they don't require sedation and a lot of time.Aaron Aas

## 2023-11-03 NOTE — Telephone Encounter (Signed)
 No response heard back from my chart if patient needs more K+, via secure chat patient responded I have meds for that it's called kor I guess I will start taking that.  Next patient appointment 11/29/23.  Will keep note open to follow up on port removal.

## 2023-11-03 NOTE — Telephone Encounter (Signed)
 Patient called today and said that no one's talk to her about getting her port taken out.  Did ask Dr. Jerome Moores about it and she says Kara [name] her nurse will be looking into that and I spoke to her today and she says she has several things to get with everybody and so hopefully she will be getting it done by 2 to 3 days

## 2023-11-03 NOTE — Telephone Encounter (Signed)
 Secure chat sent to [facility] informing patient requesting port be removed and to please provide me with a status when able.

## 2023-11-03 NOTE — Telephone Encounter (Signed)
 Per [name] [name], I can put her on Wed 6/18 at 11a and [facility] 10[facility]a [facility] - she can drive herself. It only takes less than 30 min. She does not have to be NPO. Let me know. Thanks.  Outbound call; voice message and my chart message sent.

## 2023-11-04 ENCOUNTER — Telehealth: Payer: Self-pay | Admitting: *Deleted

## 2023-11-04 NOTE — Telephone Encounter (Signed)
 Patient called back today she says she had talked to someone and could set up the port to be taken out on 6/18 and the patient can't do that on that date. Paitent is ok for 6/24 and 6/25 : please check on that dates and let patient know.

## 2023-11-08 ENCOUNTER — Telehealth: Payer: Self-pay | Admitting: *Deleted

## 2023-11-08 NOTE — Telephone Encounter (Signed)
 Patient called today to see what time she has to get her port out.  I looked in the computer and I did not see an appointment at all yet.  Talk to [facility] and she says that she still has nothing because Leary Provencal has been out not sure if it is for vacation or sick and so we cannot do anything until she gets back.  What I called the patient back as she is no I told her the 25th, I told her yes that is the date that you want to get done but were not sure because we do not have the ability to tell anybody what dates are anything to do but if the 25th is going to be open then she sure will help you to get it done but were just sitting there waiting for Leary Provencal to come back to work.  And they do get it Terrea Ferrier says that she would call her the date and time

## 2023-11-08 NOTE — Telephone Encounter (Signed)
 Per Chase for 6/25 port removal Want to offer 2pm? She can be here at 1:30 as it will be a local anesthesia case.   Voicemail also received from patient 11/08/23 at 11:55AM.  My chart message sent to patient asking if this date would work for her.

## 2023-11-09 ENCOUNTER — Encounter: Payer: Self-pay | Admitting: Internal Medicine

## 2023-11-09 ENCOUNTER — Encounter: Payer: Self-pay | Admitting: Oncology

## 2023-11-15 DIAGNOSIS — Z7901 Long term (current) use of anticoagulants: Secondary | ICD-10-CM | POA: Diagnosis not present

## 2023-11-15 DIAGNOSIS — Z86711 Personal history of pulmonary embolism: Secondary | ICD-10-CM | POA: Diagnosis not present

## 2023-11-15 NOTE — Progress Notes (Signed)
 Patient for IR Port removal on Wed 11/16/23, I called and LVM for the patient on the phone and gave pre-procedure instructions. VM made pt aware to be here at 1:30p and check in at the new entrance. Called 11/15/23

## 2023-11-16 ENCOUNTER — Ambulatory Visit
Admission: RE | Admit: 2023-11-16 | Discharge: 2023-11-16 | Disposition: A | Discharge status: Home / Self Care | Source: Ambulatory Visit | Attending: Oncology | Admitting: Oncology

## 2023-11-16 DIAGNOSIS — Z452 Encounter for adjustment and management of vascular access device: Secondary | ICD-10-CM | POA: Insufficient documentation

## 2023-11-16 DIAGNOSIS — Z95828 Presence of other vascular implants and grafts: Secondary | ICD-10-CM

## 2023-11-16 DIAGNOSIS — Z85118 Personal history of other malignant neoplasm of bronchus and lung: Secondary | ICD-10-CM | POA: Insufficient documentation

## 2023-11-16 HISTORY — PX: IR REMOVAL TUN ACCESS W/ PORT W/O FL MOD SED: IMG2290

## 2023-11-16 MED ORDER — LIDOCAINE-EPINEPHRINE 1 %-1:100000 IJ SOLN
INTRAMUSCULAR | Status: AC
  Filled 2023-11-16: qty 1

## 2023-11-16 MED ORDER — LIDOCAINE HCL 1 % IJ SOLN
INTRAMUSCULAR | Status: AC
  Filled 2023-11-16: qty 20

## 2023-11-16 MED ORDER — LIDOCAINE HCL 1 % IJ SOLN: 20.0000 mL | Freq: Once | INTRAMUSCULAR | Status: DC

## 2023-11-16 MED ORDER — LIDOCAINE-EPINEPHRINE 1 %-1:100000 IJ SOLN
20.0000 mL | Freq: Once | INTRAMUSCULAR | Status: AC
  Administered 2023-11-16: 20 mL via INTRADERMAL

## 2023-11-16 NOTE — Addendum Note (Signed)
 Encounter addended by: Harvey Mercy NOVAK, RT on: 11/16/2023 1:02 PM  Actions taken: Imaging Exam ended

## 2023-11-16 NOTE — Procedures (Signed)
 Interventional Radiology Procedure:   Indications: History of lung cancer and port is no longer needed  Procedure: Port explant  Findings: Complete removal of right chest port  Complications: None     EBL: Minimal  Plan: Keep incision dry for 24 hours  Sara Selvidge R. Philip, MD  Pager: [phone]

## 2023-11-17 ENCOUNTER — Other Ambulatory Visit: Payer: Self-pay | Admitting: Pharmacist

## 2023-11-17 ENCOUNTER — Ambulatory Visit
Admission: RE | Admit: 2023-11-17 | Discharge: 2023-11-17 | Disposition: A | Discharge status: Home / Self Care | Source: Ambulatory Visit | Attending: Oncology | Admitting: Oncology

## 2023-11-17 DIAGNOSIS — I358 Other nonrheumatic aortic valve disorders: Secondary | ICD-10-CM | POA: Diagnosis not present

## 2023-11-17 DIAGNOSIS — C3411 Malignant neoplasm of upper lobe, right bronchus or lung: Secondary | ICD-10-CM

## 2023-11-17 DIAGNOSIS — I5030 Unspecified diastolic (congestive) heart failure: Secondary | ICD-10-CM | POA: Diagnosis not present

## 2023-11-17 DIAGNOSIS — Z08 Encounter for follow-up examination after completed treatment for malignant neoplasm: Secondary | ICD-10-CM | POA: Diagnosis not present

## 2023-11-17 DIAGNOSIS — I071 Rheumatic tricuspid insufficiency: Secondary | ICD-10-CM | POA: Diagnosis not present

## 2023-11-17 DIAGNOSIS — I083 Combined rheumatic disorders of mitral, aortic and tricuspid valves: Secondary | ICD-10-CM

## 2023-11-17 LAB — ECHOCARDIOGRAM COMPLETE
AR max vel: 2.37 cm2
AV Area VTI: 2.57 cm2
AV Area mean vel: 2.38 cm2
AV Mean grad: 3 mmHg
AV Peak grad: 5.8 mmHg
Ao pk vel: 1.2 m/s
Area-P 1/2: 4.46 cm2
MV VTI: 2.22 cm2
S' Lateral: 2.6 cm

## 2023-11-17 MED ORDER — OSIMERTINIB MESYLATE 80 MG PO TABS: 80.0000 mg | ORAL_TABLET | Freq: Every day | ORAL | 2 refills | Status: DC

## 2023-11-17 NOTE — Progress Notes (Signed)
*  PRELIMINARY RESULTS* Echocardiogram 2D Echocardiogram has been performed.  Kara [name] 11/17/2023, 10:50 AM

## 2023-11-29 ENCOUNTER — Encounter: Payer: Self-pay | Admitting: Oncology

## 2023-11-29 ENCOUNTER — Inpatient Hospital Stay: Attending: Oncology

## 2023-11-29 ENCOUNTER — Inpatient Hospital Stay (HOSPITAL_BASED_OUTPATIENT_CLINIC_OR_DEPARTMENT_OTHER): Admitting: Oncology

## 2023-11-29 VITALS — BP 106/71 | HR 102 | Temp 98.1°F | Resp 17 | Wt 187.0 lb

## 2023-11-29 DIAGNOSIS — D6959 Other secondary thrombocytopenia: Secondary | ICD-10-CM | POA: Diagnosis not present

## 2023-11-29 DIAGNOSIS — T50905A Adverse effect of unspecified drugs, medicaments and biological substances, initial encounter: Secondary | ICD-10-CM

## 2023-11-29 DIAGNOSIS — C3411 Malignant neoplasm of upper lobe, right bronchus or lung: Secondary | ICD-10-CM

## 2023-11-29 DIAGNOSIS — Z923 Personal history of irradiation: Secondary | ICD-10-CM | POA: Insufficient documentation

## 2023-11-29 DIAGNOSIS — Z7722 Contact with and (suspected) exposure to environmental tobacco smoke (acute) (chronic): Secondary | ICD-10-CM | POA: Insufficient documentation

## 2023-11-29 DIAGNOSIS — Z79899 Other long term (current) drug therapy: Secondary | ICD-10-CM

## 2023-11-29 DIAGNOSIS — Z803 Family history of malignant neoplasm of breast: Secondary | ICD-10-CM | POA: Insufficient documentation

## 2023-11-29 DIAGNOSIS — Z86711 Personal history of pulmonary embolism: Secondary | ICD-10-CM | POA: Diagnosis not present

## 2023-11-29 DIAGNOSIS — C3490 Malignant neoplasm of unspecified part of unspecified bronchus or lung: Secondary | ICD-10-CM

## 2023-11-29 DIAGNOSIS — D696 Thrombocytopenia, unspecified: Secondary | ICD-10-CM | POA: Insufficient documentation

## 2023-11-29 DIAGNOSIS — Z7901 Long term (current) use of anticoagulants: Secondary | ICD-10-CM | POA: Diagnosis not present

## 2023-11-29 LAB — CBC WITH DIFFERENTIAL (CANCER CENTER ONLY)
Abs Immature Granulocytes: 0.01 K/uL (ref 0.00–0.07)
Basophils Absolute: 0 K/uL (ref 0.0–0.1)
Basophils Relative: 1 %
Eosinophils Absolute: 0 K/uL (ref 0.0–0.5)
Eosinophils Relative: 1 %
HCT: 38.1 % (ref 36.0–46.0)
Hemoglobin: 12.2 g/dL (ref 12.0–15.0)
Immature Granulocytes: 0 %
Lymphocytes Relative: 17 %
Lymphs Abs: 0.6 K/uL — ABNORMAL LOW (ref 0.7–4.0)
MCH: 29.8 pg (ref 26.0–34.0)
MCHC: 32 g/dL (ref 30.0–36.0)
MCV: 93.2 fL (ref 80.0–100.0)
Monocytes Absolute: 0.5 K/uL (ref 0.1–1.0)
Monocytes Relative: 12 %
Neutro Abs: 2.7 K/uL (ref 1.7–7.7)
Neutrophils Relative %: 69 %
Platelet Count: 90 K/uL — ABNORMAL LOW (ref 150–400)
RBC: 4.09 MIL/uL (ref 3.87–5.11)
RDW: 13 % (ref 11.5–15.5)
WBC Count: 3.8 K/uL — ABNORMAL LOW (ref 4.0–10.5)
nRBC: 0 % (ref 0.0–0.2)

## 2023-11-29 LAB — CMP (CANCER CENTER ONLY)
ALT: 17 U/L (ref 0–44)
AST: 25 U/L (ref 15–41)
Albumin: 3.3 g/dL — ABNORMAL LOW (ref 3.5–5.0)
Alkaline Phosphatase: 51 U/L (ref 38–126)
Anion gap: 6 (ref 5–15)
BUN: 14 mg/dL (ref 8–23)
CO2: 22 mmol/L (ref 22–32)
Calcium: 8.9 mg/dL (ref 8.9–10.3)
Chloride: 108 mmol/L (ref 98–111)
Creatinine: 0.99 mg/dL (ref 0.44–1.00)
GFR, Estimated: >60 mL/min (ref >60)
Glucose, Bld: 187 mg/dL — ABNORMAL HIGH (ref 70–99)
Potassium: 4 mmol/L (ref 3.5–5.1)
Sodium: 136 mmol/L (ref 135–145)
Total Bilirubin: 0.4 mg/dL (ref 0.0–1.2)
Total Protein: 7.1 g/dL (ref 6.5–8.1)

## 2023-11-29 NOTE — Progress Notes (Signed)
 Weeks.  I will see her back in 7 weeks.    Hematology/Oncology Consult note [facility]  [phone] [phone]  Patient Care Team: Sadie Manna, MD as PCP - General (Internal Medicine) Verdene Gills, RN as Oncology Nurse Navigator Melanee Annah BROCKS, MD as Consulting Physician (Oncology)   Name of the patient: Kara [name]  [id number]  04/24/1951   Date of visit: 11/29/23  Diagnosis-  right upper lobe lung cancer s/p SBRT now with mediastinal recurrence   Chief complaint/ Reason for visit-routine follow-up of lung cancer presently on Tagrisso   Heme/Onc history:  Patient is a 73 year old female who underwent SBRT for presumed right upper lobe lung cancer in December 2022. She was in remission until November 2024 when she was noted to have hypermetabolic in the right hilum as well as mediastinal region. 15 x 12 mm nodular opacity in the dome of the right diaphragm. 10 mm left cervical lymph node. 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 Right pleural thoracentesis negative for malignancy.  Patient completed concurrent chemoradiation with weekly CarboTaxol in March 2025.  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  MSI not high.  CT DNA tumor fraction 7%   CT scans after concurrent chemoradiation showed stable disease with stable mediastinal adenopathy asWell as postradiation changes noted in the perihilar right lung.  She he is on adjuvant Tagrisso  starting May 2025    Interval history-tolerating Tagrisso  well without any significant side effects.  Denies any skin rash or diarrhea.  She is still working full-time.  Has baseline fatigue.  ECOG PS- 1 Pain scale- 0   Review of systems- Review of Systems  Constitutional:  Positive for malaise/fatigue. Negative for  chills, fever and weight loss.  HENT:  Negative for congestion, ear discharge and nosebleeds.   Eyes:  Negative for blurred vision.  Respiratory:  Negative for cough, hemoptysis, sputum production, shortness of breath and wheezing.   Cardiovascular:  Negative for chest pain, palpitations, orthopnea and claudication.  Gastrointestinal:  Negative for abdominal pain, blood in stool, constipation, diarrhea, heartburn, melena, nausea and vomiting.  Genitourinary:  Negative for dysuria, flank pain, frequency, hematuria and urgency.  Musculoskeletal:  Negative for back pain, joint pain and myalgias.  Skin:  Negative for rash.  Neurological:  Negative for dizziness, tingling, focal weakness, seizures, weakness and headaches.  Endo/Heme/Allergies:  Does not bruise/bleed easily.  Psychiatric/Behavioral:  Negative for depression and suicidal ideas. The patient does not have insomnia.       Allergies  Allergen Reactions   Adhesive [Tape] Other (See Comments)    Pt reports, when removing tape from skin most tapes pull her skin off. Ok to use paper tape   Avapro  [Irbesartan ] Anaphylaxis    Facial swelling   Morphine  And Codeine  Nausea And Vomiting   Other Hives    Chlorhexadine wipes/CHG wipes,    Latex Rash    Exam gloves/ does not react around elastic and lips don't swell when blowing balloons     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, 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   CHF (congestive heart failure) (HCC)    followed colon resection, wouldn't let me get up   Chronic anticoagulation    Degenerative disc disease, lumbar  Diverticulitis 2018   Diverticulosis    DVT of lower extremity, bilateral (HCC) 2018   Essential hypertension    History of being hospitalized    1 month ago for 3 days, vomiting, and kink in upper intestine   History of hiatal hernia    Mediastinal adenopathy 04/2023   MRSA nasal colonization     Non-small cell lung cancer (HCC) 2022   right   Obesity (BMI 30-39.9)    Osteoarthritis    Osteoporosis    PE (pulmonary thromboembolism) (HCC) 2018   Pneumonia    Small bowel obstruction (HCC) 03/20/2023   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HCC)    Type 2 diabetes mellitus with obesity (HCC)    Vertigo      Past Surgical History:  Procedure Laterality Date   APPENDECTOMY N/A 02/24/2017   Procedure: Incidental  APPENDECTOMY;  Surgeon: Shelva Dunnings, MD;  Location: [name];  Service: General;  Laterality: N/A;   CARPAL TUNNEL RELEASE Bilateral    CATARACT EXTRACTION W/PHACO Left 11/21/2018   Procedure: CATARACT EXTRACTION PHACO AND INTRAOCULAR LENS PLACEMENT (IOC) LEFT DIABETES;  Surgeon: Jaye Fallow, MD;  Location: [facility];  Service: Ophthalmology;  Laterality: Left;  latex sensitivity Diabetic - oral meds   CATARACT EXTRACTION W/PHACO Right 12/12/2018   Procedure: CATARACT EXTRACTION PHACO AND INTRAOCULAR LENS PLACEMENT (IOC) RIGHT DIABETES;  Surgeon: Jaye Fallow, MD;  Location: [facility];  Service: Ophthalmology;  Laterality: Right;  Diabetes-oral med Latex sensitiviy   COLON RESECTION SIGMOID N/A 11/02/2016   Procedure: COLON RESECTION SIGMOID;  Surgeon: Shelva Dunnings, MD;  Location: [name];  Service: General;  Laterality: N/A;   COLON SURGERY  11/02/2016   COLONOSCOPY WITH PROPOFOL  N/A 02/03/2017   Procedure: COLONOSCOPY WITH PROPOFOL ;  Surgeon: Unk Corinn Skiff, MD;  Location: [name];  Service: Gastroenterology;  Laterality: N/A;   COLOSTOMY N/A 11/02/2016   Procedure: COLOSTOMY;  Surgeon: Shelva Dunnings, MD;  Location: [name];  Service: General;  Laterality: N/A;   COLOSTOMY REVERSAL N/A 02/24/2017   Procedure: COLOSTOMY REVERSAL, ostomy takedown, spleenic flexure mobilization, excision rectal stump/distal sigmoid, anastomosis with suture reinforcement;  Surgeon: Shelva Dunnings, MD;  Location: [name];  Service:  General;  Laterality: N/A;   ENDOBRONCHIAL ULTRASOUND N/A 05/16/2023   Procedure: ENDOBRONCHIAL ULTRASOUND;  Surgeon: Tamea Dedra CROME, MD;  Location: [name];  Service: Cardiopulmonary;  Laterality: N/A;   FLEXIBLE BRONCHOSCOPY N/A 05/16/2023   Procedure: FLEXIBLE BRONCHOSCOPY;  Surgeon: Tamea Dedra CROME, MD;  Location: [name];  Service: Cardiopulmonary;  Laterality: N/A;   ILEO LOOP DIVERSION N/A 02/24/2017   Procedure: ILEO LOOP COLOSTOMY;  Surgeon: Shelva Dunnings, MD;  Location: [name];  Service: General;  Laterality: N/A;   ILEOSTOMY CLOSURE N/A 06/01/2017   Procedure: LOOP ILEOSTOMY TAKEDOWN;  Surgeon: Shelva Dunnings, MD;  Location: [name];  Service: General;  Laterality: N/A;   IR IMAGING GUIDED PORT INSERTION  06/14/2023   IR REMOVAL TUN ACCESS W/ PORT W/O FL MOD SED  11/16/2023   IVC FILTER INSERTION Right 10/2016   IVC FILTER REMOVAL N/A 08/09/2017   Procedure: IVC FILTER REMOVAL;  Surgeon: Jama Cordella MATSU, MD;  Location: [name];  Service: Cardiovascular;  Laterality: N/A;   KNEE SURGERY Right    torn menicus   LAPAROSCOPY N/A 02/24/2017   Procedure: LAPAROSCOPY DIAGNOSTIC;  Surgeon: Shelva Dunnings, MD;  Location: [name];  Service: General;  Laterality: N/A;   LYSIS OF ADHESION N/A 02/24/2017   Procedure: LYSIS OF ADHESION;  Surgeon:  Shelva Dunnings, MD;  Location: [name];  Service: General;  Laterality: N/A;   PULMONARY VENOGRAPHY N/A 11/19/2016   Procedure: Pulmonary Venography; IVC filter placement; possible pulmonary thrombectomy;  Surgeon: Jama Cordella MATSU, MD;  Location: [name];  Service: Cardiovascular;  Laterality: N/A;   SACROPLASTY N/A 11/08/2017   Procedure: SACROPLASTY S2;  Surgeon: Kathlynn Sharper, MD;  Location: [name];  Service: Orthopedics;  Laterality: N/A;   SIGMOIDOSCOPY N/A 11/13/2016   Procedure: endoscopic  flexible SIGMOIDOSCOPY;  Surgeon: Wonda Charlie BRAVO, MD;  Location: [name];  Service: General;   Laterality: N/A;   SIGMOIDOSCOPY N/A 05/19/2017   Procedure: ENID MORIN;  Surgeon: Shelva Dunnings, MD;  Location: [name];  Service: General;  Laterality: N/A;   VIDEO BRONCHOSCOPY WITH ENDOBRONCHIAL NAVIGATION N/A 02/23/2021   Procedure: ROBOTIC VIDEO BRONCHOSCOPY WITH ENDOBRONCHIAL NAVIGATION;  Surgeon: Tamea Dedra CROME, MD;  Location: [name];  Service: Pulmonary;  Laterality: N/A;    Social History   Socioeconomic History   Marital status: Married    Spouse name: Butler   Number of children: 1   Years of education: Not on file   Highest education level: Not on file  Occupational History   Not on file  Tobacco Use   Smoking status: Never    Passive exposure: Current (husband smokes)   Smokeless tobacco: Never  Vaping Use   Vaping status: Never Used  Substance and Sexual Activity   Alcohol  use: No    Alcohol /week: 0.0 standard drinks of alcohol    Drug use: No   Sexual activity: Never  Other Topics Concern   Not on file  Social History Narrative   Not on file   Social Drivers of Health   Financial Resource Strain: Low Risk  (05/19/2023)   [facility]   Overall Financial Resource Strain (CARDIA)    Difficulty of Paying Living Expenses: Not hard at all  Food Insecurity: No Food Insecurity (05/19/2023)   Received from [facility]   Hunger Vital Sign    Within the past 12 months, you worried that your food would run out before you got the money to buy more.: Never true    Within the past 12 months, the food you bought just didn't last and you didn't have money to get more.: Never true  Transportation Needs: No Transportation Needs (05/19/2023)   Received from [facility] - Transportation    In the past 12 months, has lack of transportation kept you from medical appointments or from getting medications?: No    Lack of Transportation (Non-Medical): No  Physical Activity: Not on  file  Stress: Not on file  Social Connections: Not on file  Intimate Partner Violence: Not At Risk (05/09/2023)   Humiliation, Afraid, Rape, and Kick questionnaire    Fear of Current or Ex-Partner: No    Emotionally Abused: No    Physically Abused: No    Sexually Abused: No    Family History  Problem Relation Age of Onset   Diabetes Mother    Heart disease Mother    Cancer Mother    Breast cancer Mother        >50   Heart disease Father    Diabetes Sister    Heart disease Sister      Current Outpatient Medications:    bisacodyl  (DULCOLAX) 10 MG suppository, Place 1 suppository (10 mg total) rectally as needed for moderate constipation., Disp: 12 suppository, Rfl:  0   candesartan (ATACAND) 16 MG tablet, Take 16 mg by mouth in the morning., Disp: , Rfl:    cholecalciferol  (VITAMIN D ) 25 MCG (1000 UNIT) tablet, Take 1,000 Units by mouth in the morning., Disp: , Rfl:    Cyanocobalamin  (VITAMIN B-12) 5000 MCG TBDP, Take 5,000 mcg by mouth in the morning., Disp: , Rfl:    cyclobenzaprine  (FLEXERIL ) 10 MG tablet, Take 10 mg by mouth at bedtime., Disp: , Rfl:    Dulaglutide (TRULICITY) 1.5 MG/0.5ML SOAJ, Inject 1.5 mg into the skin once a week. Saturday, Disp: , Rfl:    gabapentin  (NEURONTIN ) 100 MG capsule, Take 200 mg by mouth with breakfast, with lunch, and with evening meal., Disp: , Rfl:    gabapentin  (NEURONTIN ) 300 MG capsule, Take 300 mg by mouth at bedtime., Disp: , Rfl:    glipiZIDE  (GLUCOTROL ) 10 MG tablet, Take 10 mg by mouth daily before breakfast., Disp: , Rfl:    HYDROcodone -acetaminophen  (NORCO/VICODIN) 5-325 MG tablet, Take 1 tablet by mouth every 6 (six) hours as needed for moderate pain., Disp: , Rfl:    Magnesium Oxide -Mg Supplement 500 MG TABS, Take 1 tablet by mouth at bedtime., Disp: , Rfl:    omeprazole  (PRILOSEC) 20 MG capsule, Take 1 capsule (20 mg total) by mouth daily., Disp: 90 capsule, Rfl: 0   ondansetron  (ZOFRAN ) 8 MG tablet, TAKE 1 TABLET BY MOUTH EVERY  8 HOURS AS NEEDED FOR NAUSEA OR VOMITING. START ON THE 3RD DAY AFTER CHEMOTHERAPY, Disp: 30 tablet, Rfl: 1   ONETOUCH ULTRA TEST test strip, , Disp: , Rfl:    osimertinib  mesylate (TAGRISSO ) 80 MG tablet, Take 1 tablet (80 mg total) by mouth daily., Disp: 30 tablet, Rfl: 2   oxybutynin  (DITROPAN ) 5 MG tablet, Take 1 tablet by mouth 2 (two) times daily., Disp: , Rfl:    potassium chloride  SA (KLOR-CON  M) 20 MEQ tablet, Take 1 tablet (20 mEq total) by mouth daily., Disp: 30 tablet, Rfl: 1   prochlorperazine  (COMPAZINE ) 10 MG tablet, Take 1 tablet (10 mg total) by mouth every 6 (six) hours as needed for nausea or vomiting., Disp: 90 tablet, Rfl: 0   rOPINIRole  (REQUIP ) 1 MG tablet, Take 1 mg by mouth at bedtime., Disp: , Rfl:    sucralfate  (CARAFATE ) 1 g tablet, Take 1 tablet (1 g total) by mouth 3 (three) times daily with meals. Dissolve tablet in 4 tablesppons warm water swish and swallow 30 min before meals. (Patient taking differently: Take 1 g by mouth 3 (three) times daily with meals. Dissolve tablet in 4 tablespoons warm water swish and swallow 30 min before meals.), Disp: 90 tablet, Rfl: 0   warfarin (COUMADIN ) 6 MG tablet, Take 6 mg by mouth at bedtime., Disp: , Rfl:   Physical exam: There were no vitals filed for this visit. Physical Exam Cardiovascular:     Rate and Rhythm: Normal rate and regular rhythm.     Heart sounds: Normal heart sounds.  Pulmonary:     Effort: Pulmonary effort is normal.     Breath sounds: Normal breath sounds.  Abdominal:     General: Bowel sounds are normal.     Palpations: Abdomen is soft.  Skin:    General: Skin is warm and dry.  Neurological:     Mental Status: She is alert and oriented to person, place, and time.      I have personally reviewed labs listed below:    Latest Ref Rng & Units 11/01/2023  2:40 PM  CMP  Glucose 70 - 99 mg/dL 839   BUN 8 - 23 mg/dL 12   Creatinine 9.55 - 1.00 mg/dL 9.16   Sodium 864 - 854 mmol/L 137   Potassium 3.5  - 5.1 mmol/L 3.2   Chloride 98 - 111 mmol/L 107   CO2 22 - 32 mmol/L 23   Calcium  8.9 - 10.3 mg/dL 8.6   Total Protein 6.5 - 8.1 g/dL 6.8   Total Bilirubin 0.0 - 1.2 mg/dL 0.5   Alkaline Phos 38 - 126 U/L 49   AST 15 - 41 U/L 28   ALT 0 - 44 U/L 22       Latest Ref Rng & Units 11/01/2023    2:40 PM  CBC  WBC 4.0 - 10.5 K/uL 3.2   Hemoglobin 12.0 - 15.0 g/dL 87.8   Hematocrit 63.9 - 46.0 % 38.4   Platelets 150 - 400 K/uL 78    I have personally reviewed Radiology images listed below: No images are attached to the encounter.  ECHOCARDIOGRAM COMPLETE Result Date: 11/17/2023    ECHOCARDIOGRAM REPORT   Patient Name:   Shawne KENYADA DOSCH Date of Exam: 11/17/2023 Medical Rec #:  [id number]             Height:       63.0 in Accession #:    [id number]            Weight:       187.0 lb Date of Birth:  Jun 24, 1950             BSA:          1.879 m Patient Age:    73 years              BP:           109/68 mmHg Patient Gender: F                     HR:           85 bpm. Exam Location:  [name] Procedure: 2D Echo, Cardiac Doppler, Color Doppler, 3D Echo and Strain Analysis            (Both Spectral and Color Flow Doppler were utilized during            procedure). Indications:     Cancer of upper lobe of right lung C34.11                  Malignant neoplasm of upper lobe of right lung.  History:         Patient has prior history of Echocardiogram examinations, most                  recent 11/19/2016. CHF; Risk Factors:Diabetes. Pulmonary                  embolism.  Sonographer:     Christopher Furnace Referring Phys:  [id number] [facility] C Nadea Kirkland Diagnosing Phys: Cara JONETTA Lovelace MD  Sonographer Comments: Global longitudinal strain was attempted. IMPRESSIONS  1. Left ventricular ejection fraction, by estimation, is 55 to 60%. The left ventricle has normal function. The left ventricle has no regional wall motion abnormalities. Left ventricular diastolic parameters are consistent with Grade I diastolic dysfunction (impaired  relaxation). The average left ventricular global longitudinal strain is 8.6 %. The global longitudinal strain is abnormal.  2. Right ventricular systolic function is normal. The right ventricular size is normal.  3. The mitral valve is normal in structure. No evidence of mitral valve regurgitation.  4. The aortic valve is grossly normal. Aortic valve regurgitation is not visualized. Aortic valve sclerosis/calcification is present, without any evidence of aortic stenosis. FINDINGS  Left Ventricle: Left ventricular ejection fraction, by estimation, is 55 to 60%. The left ventricle has normal function. The left ventricle has no regional wall motion abnormalities. The average left ventricular global longitudinal strain is 8.6 %. Strain was performed and the global longitudinal strain is abnormal. The left ventricular internal cavity size was normal in size. There is no left ventricular hypertrophy. Left ventricular diastolic parameters are consistent with Grade I diastolic dysfunction (impaired relaxation). Right Ventricle: The right ventricular size is normal. No increase in right ventricular wall thickness. Right ventricular systolic function is normal. Left Atrium: Left atrial size was normal in size. Right Atrium: Right atrial size was normal in size. Pericardium: There is no evidence of pericardial effusion. Mitral Valve: The mitral valve is normal in structure. There is mild thickening of the mitral valve leaflet(s). There is mild calcification of the mitral valve leaflet(s). Normal mobility of the mitral valve leaflets. No evidence of mitral valve regurgitation. MV peak gradient, 9.7 mmHg. The mean mitral valve gradient is 4.0 mmHg. Tricuspid Valve: The tricuspid valve is normal in structure. Tricuspid valve regurgitation is mild. Aortic Valve: The aortic valve is grossly normal. Aortic valve regurgitation is not visualized. Aortic valve sclerosis/calcification is present, without any evidence of aortic stenosis.  Aortic valve mean gradient measures 3.0 mmHg. Aortic valve peak gradient measures 5.8 mmHg. Aortic valve area, by VTI measures 2.57 cm. Pulmonic Valve: The pulmonic valve was normal in structure. Pulmonic valve regurgitation is not visualized. Aorta: The ascending aorta was not well visualized. IAS/Shunts: No atrial level shunt detected by color flow Doppler. Additional Comments: 3D was performed not requiring image post processing on an independent workstation and was indeterminate.  LEFT VENTRICLE PLAX 2D LVIDd:         3.70 cm   Diastology LVIDs:         2.60 cm   LV e' medial:    5.22 cm/s LV PW:         0.90 cm   LV E/e' medial:  17.8 LV IVS:        0.90 cm   LV e' lateral:   6.74 cm/s LVOT diam:     2.10 cm   LV E/e' lateral: 13.8 LV SV:         56 LV SV Index:   30        2D Longitudinal Strain LVOT Area:     3.46 cm  2D Strain GLS (A4C):   14.1 %                          2D Strain GLS (A3C):   5.9 %                          2D Strain GLS (A2C):   5.8 %                          2D Strain GLS Avg:     8.6 % RIGHT VENTRICLE RV Basal diam:  2.80 cm RV Mid diam:    2.30 cm LEFT ATRIUM             Index  RIGHT ATRIUM          Index LA diam:        2.30 cm 1.22 cm/m   RA Area:     7.61 cm LA Vol (A2C):   15.0 ml 7.98 ml/m   RA Volume:   10.50 ml 5.59 ml/m LA Vol (A4C):   21.9 ml 11.65 ml/m LA Biplane Vol: 18.2 ml 9.69 ml/m  AORTIC VALVE AV Area (Vmax):    2.37 cm AV Area (Vmean):   2.38 cm AV Area (VTI):     2.57 cm AV Vmax:           120.00 cm/s AV Vmean:          84.700 cm/s AV VTI:            0.217 m AV Peak Grad:      5.8 mmHg AV Mean Grad:      3.0 mmHg LVOT Vmax:         82.20 cm/s LVOT Vmean:        58.100 cm/s LVOT VTI:          0.161 m LVOT/AV VTI ratio: 0.74  AORTA Ao Root diam: 2.70 cm MITRAL VALVE                TRICUSPID VALVE MV Area (PHT): 4.46 cm     TR Peak grad:   16.6 mmHg MV Area VTI:   2.22 cm     TR Vmax:        204.00 cm/s MV Peak grad:  9.7 mmHg MV Mean grad:  4.0 mmHg      SHUNTS MV Vmax:       1.56 m/s     Systemic VTI:  0.16 m MV Vmean:      91.1 cm/s    Systemic Diam: 2.10 cm MV Decel Time: 170 msec MV E velocity: 92.80 cm/s MV A velocity: 140.00 cm/s MV E/A ratio:  0.66 Dwayne D Callwood MD Electronically signed by Cara JONETTA Lovelace MD Signature Date/Time: 11/17/2023/2:22:07 PM    Final    IR Removal Tun Access W/ Port W/O FL Result Date: 11/16/2023 INDICATION: 73 year old with history of lung cancer. Port-A-Cath is no longer needed. EXAM: REMOVAL OF RIGHT CHEST PORT-A-CATH MEDICATIONS: 1% lidocaine  with epinephrine  ANESTHESIA/SEDATION: No sedation FLUOROSCOPY: None COMPLICATIONS: None immediate. PROCEDURE: Informed written consent was obtained from the patient after a thorough discussion of the procedural risks, benefits and alternatives. All questions were addressed. Maximal Sterile Barrier Technique was utilized including caps, mask, sterile gowns, sterile gloves, sterile drape, hand hygiene and skin antiseptic. A timeout was performed prior to the initiation of the procedure. The right chest was prepped and draped in a sterile fashion. 1% lidocaine  with epinephrine  was utilized for local anesthesia. An incision was made over the previously healed surgical incision. Utilizing blunt dissection, the port catheter and reservoir were removed from the underlying subcutaneous tissue in their entirety. The pocket was irrigated with a copious amount of sterile normal saline. The subcutaneous tissue was closed with 3-0 Vicryl interrupted subcutaneous stitches. A 4-0 Vicryl running subcuticular stitch was utilized to approximate the skin. Dermabond was applied. IMPRESSION: Successful removal of right chest Port-A-Cath. Electronically Signed   By: Juliene Balder M.D.   On: 11/16/2023 14:56     Assessment and plan- Patient is a 73 y.o. adult with history of stage I right upper lobe lung cancer s/p SBRT in the past.  She had biopsy-proven mediastinal recurrence in December 2024.  She is  s/p concurrent chemoradiation and presently on adjuvant Tagrisso  here for routine follow-up  Patient is on Tagrisso  80 mg daily and is tolerating it well without any significant side effects.  She does have moderate thrombocytopenia which began after starting Tagrisso .  Platelet counts Were normal prior to that and presently her platelet count is 90.  It was 78 last month.  His longest platelets are more than 50 she will continue with present dose of Tagrisso . She will at least take it for 3 years or until progression or toxicity based on tolerance.  she already has a PET scan scheduled for 01/11/2024 as per radiation oncology.  CBC with differential, CMP in 3 weeks in 7 weeks and I will see her back in 7 weeks   Visit Diagnosis 1. Cancer of upper lobe of right lung (HCC)   2. Recurrent non-small cell lung cancer (NSCLC) (HCC)   3. High risk medication use      Dr. Annah Skene, MD, MPH [facility] at [facility] [phone] 11/29/2023 1:01 PM

## 2023-11-30 ENCOUNTER — Other Ambulatory Visit: Payer: Self-pay

## 2023-12-07 ENCOUNTER — Other Ambulatory Visit: Payer: Self-pay

## 2023-12-07 ENCOUNTER — Emergency Department

## 2023-12-07 ENCOUNTER — Inpatient Hospital Stay
Admission: EM | Admit: 2023-12-07 | Discharge: 2023-12-09 | DRG: 871 | Disposition: A | Discharge status: Home / Self Care | Attending: Obstetrics and Gynecology | Admitting: Obstetrics and Gynecology

## 2023-12-07 DIAGNOSIS — C3411 Malignant neoplasm of upper lobe, right bronchus or lung: Secondary | ICD-10-CM | POA: Diagnosis present

## 2023-12-07 DIAGNOSIS — Z91040 Latex allergy status: Secondary | ICD-10-CM

## 2023-12-07 DIAGNOSIS — Z86711 Personal history of pulmonary embolism: Secondary | ICD-10-CM | POA: Diagnosis not present

## 2023-12-07 DIAGNOSIS — A419 Sepsis, unspecified organism: Secondary | ICD-10-CM | POA: Diagnosis not present

## 2023-12-07 DIAGNOSIS — N3000 Acute cystitis without hematuria: Secondary | ICD-10-CM | POA: Diagnosis not present

## 2023-12-07 DIAGNOSIS — R000 Tachycardia, unspecified: Secondary | ICD-10-CM | POA: Diagnosis not present

## 2023-12-07 DIAGNOSIS — Z7901 Long term (current) use of anticoagulants: Secondary | ICD-10-CM | POA: Diagnosis not present

## 2023-12-07 DIAGNOSIS — M542 Cervicalgia: Secondary | ICD-10-CM | POA: Diagnosis not present

## 2023-12-07 DIAGNOSIS — Z932 Ileostomy status: Secondary | ICD-10-CM

## 2023-12-07 DIAGNOSIS — M810 Age-related osteoporosis without current pathological fracture: Secondary | ICD-10-CM | POA: Diagnosis present

## 2023-12-07 DIAGNOSIS — Z8249 Family history of ischemic heart disease and other diseases of the circulatory system: Secondary | ICD-10-CM

## 2023-12-07 DIAGNOSIS — R918 Other nonspecific abnormal finding of lung field: Secondary | ICD-10-CM | POA: Diagnosis not present

## 2023-12-07 DIAGNOSIS — Z79899 Other long term (current) drug therapy: Secondary | ICD-10-CM

## 2023-12-07 DIAGNOSIS — T451X5A Adverse effect of antineoplastic and immunosuppressive drugs, initial encounter: Secondary | ICD-10-CM | POA: Diagnosis present

## 2023-12-07 DIAGNOSIS — Z888 Allergy status to other drugs, medicaments and biological substances status: Secondary | ICD-10-CM

## 2023-12-07 DIAGNOSIS — N390 Urinary tract infection, site not specified: Secondary | ICD-10-CM | POA: Diagnosis present

## 2023-12-07 DIAGNOSIS — I10 Essential (primary) hypertension: Secondary | ICD-10-CM | POA: Diagnosis present

## 2023-12-07 DIAGNOSIS — R8290 Unspecified abnormal findings in urine: Secondary | ICD-10-CM

## 2023-12-07 DIAGNOSIS — E872 Acidosis, unspecified: Secondary | ICD-10-CM | POA: Diagnosis present

## 2023-12-07 DIAGNOSIS — Z885 Allergy status to narcotic agent status: Secondary | ICD-10-CM

## 2023-12-07 DIAGNOSIS — S5002XA Contusion of left elbow, initial encounter: Secondary | ICD-10-CM | POA: Diagnosis present

## 2023-12-07 DIAGNOSIS — E119 Type 2 diabetes mellitus without complications: Secondary | ICD-10-CM | POA: Diagnosis present

## 2023-12-07 DIAGNOSIS — D701 Agranulocytosis secondary to cancer chemotherapy: Secondary | ICD-10-CM | POA: Diagnosis present

## 2023-12-07 DIAGNOSIS — Z7985 Long-term (current) use of injectable non-insulin antidiabetic drugs: Secondary | ICD-10-CM

## 2023-12-07 DIAGNOSIS — R1110 Vomiting, unspecified: Secondary | ICD-10-CM | POA: Diagnosis not present

## 2023-12-07 DIAGNOSIS — Z7984 Long term (current) use of oral hypoglycemic drugs: Secondary | ICD-10-CM

## 2023-12-07 DIAGNOSIS — Z833 Family history of diabetes mellitus: Secondary | ICD-10-CM

## 2023-12-07 DIAGNOSIS — R32 Unspecified urinary incontinence: Secondary | ICD-10-CM | POA: Diagnosis present

## 2023-12-07 DIAGNOSIS — G44309 Post-traumatic headache, unspecified, not intractable: Secondary | ICD-10-CM | POA: Diagnosis not present

## 2023-12-07 DIAGNOSIS — J189 Pneumonia, unspecified organism: Secondary | ICD-10-CM | POA: Diagnosis not present

## 2023-12-07 DIAGNOSIS — G894 Chronic pain syndrome: Secondary | ICD-10-CM | POA: Diagnosis present

## 2023-12-07 DIAGNOSIS — Z95828 Presence of other vascular implants and grafts: Secondary | ICD-10-CM

## 2023-12-07 DIAGNOSIS — K828 Other specified diseases of gallbladder: Secondary | ICD-10-CM | POA: Diagnosis not present

## 2023-12-07 DIAGNOSIS — Z86718 Personal history of other venous thrombosis and embolism: Secondary | ICD-10-CM

## 2023-12-07 DIAGNOSIS — R109 Unspecified abdominal pain: Secondary | ICD-10-CM | POA: Diagnosis not present

## 2023-12-07 DIAGNOSIS — Z79891 Long term (current) use of opiate analgesic: Secondary | ICD-10-CM

## 2023-12-07 DIAGNOSIS — K8020 Calculus of gallbladder without cholecystitis without obstruction: Secondary | ICD-10-CM | POA: Diagnosis not present

## 2023-12-07 DIAGNOSIS — C3490 Malignant neoplasm of unspecified part of unspecified bronchus or lung: Secondary | ICD-10-CM | POA: Diagnosis present

## 2023-12-07 DIAGNOSIS — R112 Nausea with vomiting, unspecified: Secondary | ICD-10-CM | POA: Diagnosis not present

## 2023-12-07 DIAGNOSIS — R1084 Generalized abdominal pain: Secondary | ICD-10-CM | POA: Diagnosis not present

## 2023-12-07 DIAGNOSIS — R197 Diarrhea, unspecified: Secondary | ICD-10-CM | POA: Diagnosis present

## 2023-12-07 DIAGNOSIS — N179 Acute kidney failure, unspecified: Secondary | ICD-10-CM | POA: Diagnosis present

## 2023-12-07 DIAGNOSIS — S5012XA Contusion of left forearm, initial encounter: Secondary | ICD-10-CM | POA: Diagnosis present

## 2023-12-07 DIAGNOSIS — Z9981 Dependence on supplemental oxygen: Secondary | ICD-10-CM

## 2023-12-07 DIAGNOSIS — E669 Obesity, unspecified: Secondary | ICD-10-CM | POA: Diagnosis present

## 2023-12-07 DIAGNOSIS — W19XXXA Unspecified fall, initial encounter: Secondary | ICD-10-CM | POA: Diagnosis present

## 2023-12-07 DIAGNOSIS — I959 Hypotension, unspecified: Secondary | ICD-10-CM | POA: Diagnosis not present

## 2023-12-07 DIAGNOSIS — Z803 Family history of malignant neoplasm of breast: Secondary | ICD-10-CM

## 2023-12-07 DIAGNOSIS — M51369 Other intervertebral disc degeneration, lumbar region without mention of lumbar back pain or lower extremity pain: Secondary | ICD-10-CM | POA: Diagnosis present

## 2023-12-07 LAB — COMPREHENSIVE METABOLIC PANEL WITH GFR
ALT: 18 U/L (ref 0–44)
AST: 31 U/L (ref 15–41)
Albumin: 3.4 g/dL — ABNORMAL LOW (ref 3.5–5.0)
Alkaline Phosphatase: 61 U/L (ref 38–126)
Anion gap: 11 (ref 5–15)
BUN: 13 mg/dL (ref 8–23)
CO2: 21 mmol/L — ABNORMAL LOW (ref 22–32)
Calcium: 9.2 mg/dL (ref 8.9–10.3)
Chloride: 108 mmol/L (ref 98–111)
Creatinine, Ser: 1.13 mg/dL — ABNORMAL HIGH (ref 0.44–1.00)
GFR, Estimated: 52 mL/min — ABNORMAL LOW (ref >60)
Glucose, Bld: 182 mg/dL — ABNORMAL HIGH (ref 70–99)
Potassium: 4 mmol/L (ref 3.5–5.1)
Sodium: 140 mmol/L (ref 135–145)
Total Bilirubin: 0.9 mg/dL (ref 0.0–1.2)
Total Protein: 7.7 g/dL (ref 6.5–8.1)

## 2023-12-07 LAB — CBC
HCT: 40.7 % (ref 36.0–46.0)
Hemoglobin: 13.2 g/dL (ref 12.0–15.0)
MCH: 30.3 pg (ref 26.0–34.0)
MCHC: 32.4 g/dL (ref 30.0–36.0)
MCV: 93.6 fL (ref 80.0–100.0)
Platelets: 83 K/uL — ABNORMAL LOW (ref 150–400)
RBC: 4.35 MIL/uL (ref 3.87–5.11)
RDW: 13.2 % (ref 11.5–15.5)
WBC: 2.1 K/uL — ABNORMAL LOW (ref 4.0–10.5)
nRBC: 0 % (ref 0.0–0.2)

## 2023-12-07 LAB — LIPASE, BLOOD: Lipase: 26 U/L (ref 11–51)

## 2023-12-07 LAB — LACTIC ACID, PLASMA: Lactic Acid, Venous: 2.1 mmol/L (ref 0.5–1.9)

## 2023-12-07 LAB — TROPONIN I (HIGH SENSITIVITY): Troponin I (High Sensitivity): 15 ng/L (ref <18)

## 2023-12-07 MED ORDER — SODIUM CHLORIDE 0.9 % IV SOLN
2.0000 g | Freq: Once | INTRAVENOUS | Status: AC
  Administered 2023-12-07: 2 g via INTRAVENOUS
  Filled 2023-12-07: qty 12.5

## 2023-12-07 MED ORDER — METRONIDAZOLE 500 MG/100ML IV SOLN
500.0000 mg | Freq: Once | INTRAVENOUS | Status: AC
  Administered 2023-12-07: 500 mg via INTRAVENOUS
  Filled 2023-12-07: qty 100

## 2023-12-07 MED ORDER — SODIUM CHLORIDE 0.9 % IV BOLUS
1000.0000 mL | Freq: Once | INTRAVENOUS | Status: AC
  Administered 2023-12-07: 1000 mL via INTRAVENOUS

## 2023-12-07 MED ORDER — VANCOMYCIN HCL IN DEXTROSE 1-5 GM/200ML-% IV SOLN: 1000.0000 mg | Freq: Once | INTRAVENOUS | Status: DC

## 2023-12-07 MED ORDER — IOHEXOL 350 MG/ML SOLN
100.0000 mL | Freq: Once | INTRAVENOUS | Status: AC | PRN
  Administered 2023-12-08: 100 mL via INTRAVENOUS

## 2023-12-07 MED ORDER — ONDANSETRON HCL 4 MG/2ML IJ SOLN
4.0000 mg | Freq: Once | INTRAMUSCULAR | Status: AC
  Administered 2023-12-07: 4 mg via INTRAVENOUS
  Filled 2023-12-07: qty 2

## 2023-12-07 MED ORDER — LACTATED RINGERS IV SOLN: INTRAVENOUS | Status: DC

## 2023-12-07 MED ORDER — VANCOMYCIN HCL 2000 MG/400ML IV SOLN
2000.0000 mg | Freq: Once | INTRAVENOUS | Status: AC
  Administered 2023-12-08: 2000 mg via INTRAVENOUS
  Filled 2023-12-07: qty 400

## 2023-12-07 NOTE — ED Provider Notes (Signed)
 [facility] Provider Note    Event Date/Time   First MD Initiated Contact with Patient 12/07/23 2217     (approximate)   History   Emesis and Abdominal Pain   HPI  Kara [name] is a 73 y.o. adult with history of PE on anticoagulation, lung cancer who comes in with abdominal pain and vomiting.  Patient reportedly has had abdominal pain and vomiting since 9 AM.  He did slide off of his couch reportedly did not hit his head no LOC.  Patient is on warfarin with an INR of 2.8.  Her CT imaging was last done on 5/15.  Patient initially told me that she did not have a fall.  But then she states oh yeah I think I did have a fall today.  She denies any known memory issues but states that she is 73 years old so who knows.  She reports having increased shortness of breath although being compliant with her warfarin as well as increased abdominal pain and vomiting that started today.  She reports that the abdominal pain has since resolved and denies any continued pain there.  I reviewed prior CT imaging she does not have any history of aneurysm.  She reports that now she just feels very weak and fatigued.  She did report not having any known diarrhea but the nurses did come in and they help change some stool but did appear very diarrhea-like.  Physical Exam   Triage Vital Signs: ED Triage Vitals [12/07/23 1856]  Encounter Vitals Group     BP (!) 151/114     Girls Systolic BP Percentile      Girls Diastolic BP Percentile      Boys Systolic BP Percentile      Boys Diastolic BP Percentile      Pulse Rate (!) 128     Resp 16     Temp 98.9 F (37.2 C)     Temp Source Oral     SpO2 93 %     Weight      Height      Head Circumference      Peak Flow      Pain Score 4     Pain Loc      Pain Education      Exclude from Growth Chart     Most recent vital signs: Vitals:   12/07/23 1856  BP: (!) 151/114  Pulse: (!) 128  Resp: 16  Temp: 98.9 F (37.2 C)   SpO2: 93%     General: Awake, no distress.  CV:  Good peripheral perfusion.  Tachycardic Resp:  Normal effort.  Abd:  No distention.  Soft and nontender Other:  No edema   ED Results / Procedures / Treatments   Labs (all labs ordered are listed, but only abnormal results are displayed) Labs Reviewed  COMPREHENSIVE METABOLIC PANEL WITH GFR - Abnormal; Notable for the following components:      Result Value   CO2 21 (*)    Glucose, Bld 182 (*)    Creatinine, Ser 1.13 (*)    Albumin 3.4 (*)    GFR, Estimated 52 (*)    All other components within normal limits  CBC - Abnormal; Notable for the following components:   WBC 2.1 (*)    Platelets 83 (*)    All other components within normal limits  LIPASE, BLOOD  URINALYSIS, ROUTINE W REFLEX MICROSCOPIC     EKG  My interpretation of  EKG:  Sinus tachycardia with a rate of 145 without any ST elevation or T wave inversions, normal intervals  RADIOLOGY Pending    PROCEDURES:  Critical Care performed: Yes, see critical care procedure note(s)  .1-3 Lead EKG Interpretation  Performed by: Ernest Ronal BRAVO, MD Authorized by: Ernest Ronal BRAVO, MD     Interpretation: normal     ECG rate:  80   ECG rate assessment: normal     Rhythm: sinus rhythm     Ectopy: none     Conduction: normal   .Critical Care  Performed by: Ernest Ronal BRAVO, MD Authorized by: Ernest Ronal BRAVO, MD   Critical care provider statement:    Critical care time (minutes):  30   Critical care was necessary to treat or prevent imminent or life-threatening deterioration of the following conditions:  Sepsis   Critical care was time spent personally by me on the following activities:  Development of treatment plan with patient or surrogate, discussions with consultants, evaluation of patient's response to treatment, examination of patient, ordering and review of laboratory studies, ordering and review of radiographic studies, ordering and performing treatments and  interventions, pulse oximetry, re-evaluation of patient's condition and review of old charts    MEDICATIONS ORDERED IN ED: Medications  sodium chloride  0.9 % bolus 1,000 mL (has no administration in time range)  ondansetron  (ZOFRAN ) injection 4 mg (has no administration in time range)     IMPRESSION / MDM / ASSESSMENT AND PLAN / ED COURSE  I reviewed the triage vital signs and the nursing notes.   Patient's presentation is most consistent with acute presentation with potential threat to life or bodily function.  Patient does not seem to be the best historian is the remember if she did have a fall and given she is on warfarin I will get CT head to ensure no evidence of intercranial hemorrhage, cervical fracture.  She does report increasing shortness of breath and notably hypotensive into the 50s systolic.  Her abdomen is soft and nontender however but I do not see any signs of aneurysm on prior CT imaging I do feel like we should get CTs to ensure no other acute pathology.  It could be dehydration from the vomiting and diarrhea but given patient's low white count and not the best historian I think it be best to proceed with CT imaging to ensure there is no other acute pathology.  Given she meets sepsis criteria with heart rate and white count will start on broad-spectrum antibiotics but unclear source at this time.  CBC shows low white count with a white count of 2.1 but he has had that previously.  Lipase is normal CMP shows elevated creatinine at 1.13  Patient's blood pressure is coming back up with fluids.  Attempting to get patient to the CT scanner patient was noted to have a bedbug on her therefore patient will need De[place] before CT imaging.  Patient therefore was handed off to oncoming team pending CTs and admission   FINAL CLINICAL IMPRESSION(S) / ED DIAGNOSES   Final diagnoses:  Sepsis, due to unspecified organism, unspecified whether acute organ dysfunction present (HCC)   Hypotensive episode     Rx / DC Orders   ED Discharge Orders     None        Note:  This document was prepared using Dragon voice recognition software and may include unintentional dictation errors.   Ernest Ronal BRAVO, MD 12/07/23 [phone]

## 2023-12-07 NOTE — ED Triage Notes (Signed)
 Pt to ED via [facility] from home. Pt reports emesis, SOB and abd pain since 9am. Pt also reports slid off couch and did not hit head. No LOC. Pt is on blood thinners.   4mg  zofran  IM given PTA 146/74 93% RA 99.1 oral CBG 130 HR 140

## 2023-12-07 NOTE — Sepsis Progress Note (Signed)
 Elink monitoring for the code sepsis protocol.

## 2023-12-08 ENCOUNTER — Emergency Department

## 2023-12-08 DIAGNOSIS — R32 Unspecified urinary incontinence: Secondary | ICD-10-CM | POA: Diagnosis not present

## 2023-12-08 DIAGNOSIS — W19XXXA Unspecified fall, initial encounter: Secondary | ICD-10-CM | POA: Diagnosis present

## 2023-12-08 DIAGNOSIS — Z7985 Long-term (current) use of injectable non-insulin antidiabetic drugs: Secondary | ICD-10-CM | POA: Diagnosis not present

## 2023-12-08 DIAGNOSIS — N179 Acute kidney failure, unspecified: Secondary | ICD-10-CM | POA: Diagnosis not present

## 2023-12-08 DIAGNOSIS — R000 Tachycardia, unspecified: Secondary | ICD-10-CM | POA: Diagnosis not present

## 2023-12-08 DIAGNOSIS — K8020 Calculus of gallbladder without cholecystitis without obstruction: Secondary | ICD-10-CM | POA: Diagnosis not present

## 2023-12-08 DIAGNOSIS — E119 Type 2 diabetes mellitus without complications: Secondary | ICD-10-CM | POA: Diagnosis not present

## 2023-12-08 DIAGNOSIS — R918 Other nonspecific abnormal finding of lung field: Secondary | ICD-10-CM | POA: Diagnosis not present

## 2023-12-08 DIAGNOSIS — R109 Unspecified abdominal pain: Secondary | ICD-10-CM | POA: Diagnosis not present

## 2023-12-08 DIAGNOSIS — E669 Obesity, unspecified: Secondary | ICD-10-CM | POA: Diagnosis not present

## 2023-12-08 DIAGNOSIS — Z833 Family history of diabetes mellitus: Secondary | ICD-10-CM | POA: Diagnosis not present

## 2023-12-08 DIAGNOSIS — K828 Other specified diseases of gallbladder: Secondary | ICD-10-CM | POA: Diagnosis not present

## 2023-12-08 DIAGNOSIS — J189 Pneumonia, unspecified organism: Secondary | ICD-10-CM | POA: Diagnosis not present

## 2023-12-08 DIAGNOSIS — S5012XA Contusion of left forearm, initial encounter: Secondary | ICD-10-CM

## 2023-12-08 DIAGNOSIS — R197 Diarrhea, unspecified: Secondary | ICD-10-CM

## 2023-12-08 DIAGNOSIS — M51369 Other intervertebral disc degeneration, lumbar region without mention of lumbar back pain or lower extremity pain: Secondary | ICD-10-CM | POA: Diagnosis not present

## 2023-12-08 DIAGNOSIS — I10 Essential (primary) hypertension: Secondary | ICD-10-CM | POA: Diagnosis not present

## 2023-12-08 DIAGNOSIS — S5002XA Contusion of left elbow, initial encounter: Secondary | ICD-10-CM | POA: Diagnosis not present

## 2023-12-08 DIAGNOSIS — Z7901 Long term (current) use of anticoagulants: Secondary | ICD-10-CM | POA: Diagnosis not present

## 2023-12-08 DIAGNOSIS — M542 Cervicalgia: Secondary | ICD-10-CM | POA: Diagnosis not present

## 2023-12-08 DIAGNOSIS — D701 Agranulocytosis secondary to cancer chemotherapy: Secondary | ICD-10-CM | POA: Diagnosis not present

## 2023-12-08 DIAGNOSIS — A419 Sepsis, unspecified organism: Secondary | ICD-10-CM | POA: Diagnosis not present

## 2023-12-08 DIAGNOSIS — N3000 Acute cystitis without hematuria: Secondary | ICD-10-CM | POA: Diagnosis not present

## 2023-12-08 DIAGNOSIS — R8290 Unspecified abnormal findings in urine: Secondary | ICD-10-CM

## 2023-12-08 DIAGNOSIS — G894 Chronic pain syndrome: Secondary | ICD-10-CM | POA: Diagnosis not present

## 2023-12-08 DIAGNOSIS — M810 Age-related osteoporosis without current pathological fracture: Secondary | ICD-10-CM | POA: Diagnosis not present

## 2023-12-08 DIAGNOSIS — R1110 Vomiting, unspecified: Secondary | ICD-10-CM | POA: Diagnosis present

## 2023-12-08 DIAGNOSIS — C3411 Malignant neoplasm of upper lobe, right bronchus or lung: Secondary | ICD-10-CM | POA: Diagnosis not present

## 2023-12-08 DIAGNOSIS — Z7984 Long term (current) use of oral hypoglycemic drugs: Secondary | ICD-10-CM | POA: Diagnosis not present

## 2023-12-08 DIAGNOSIS — G44309 Post-traumatic headache, unspecified, not intractable: Secondary | ICD-10-CM | POA: Diagnosis not present

## 2023-12-08 DIAGNOSIS — E872 Acidosis, unspecified: Secondary | ICD-10-CM | POA: Diagnosis not present

## 2023-12-08 DIAGNOSIS — T451X5A Adverse effect of antineoplastic and immunosuppressive drugs, initial encounter: Secondary | ICD-10-CM | POA: Diagnosis not present

## 2023-12-08 DIAGNOSIS — I959 Hypotension, unspecified: Secondary | ICD-10-CM | POA: Diagnosis not present

## 2023-12-08 DIAGNOSIS — N390 Urinary tract infection, site not specified: Secondary | ICD-10-CM | POA: Diagnosis not present

## 2023-12-08 DIAGNOSIS — Z8249 Family history of ischemic heart disease and other diseases of the circulatory system: Secondary | ICD-10-CM | POA: Diagnosis not present

## 2023-12-08 LAB — GLUCOSE, CAPILLARY
Glucose-Capillary: 106 mg/dL — ABNORMAL HIGH (ref 70–99)
Glucose-Capillary: 117 mg/dL — ABNORMAL HIGH (ref 70–99)

## 2023-12-08 LAB — RESPIRATORY PANEL BY PCR

## 2023-12-08 LAB — URINALYSIS, ROUTINE W REFLEX MICROSCOPIC
Bilirubin Urine: NEGATIVE
Glucose, UA: NEGATIVE mg/dL
Ketones, ur: 5 mg/dL — AB
Nitrite: NEGATIVE
Protein, ur: 30 mg/dL — AB
Specific Gravity, Urine: 1.011 (ref 1.005–1.030)
WBC, UA: >50 WBC/hpf (ref 0–5)
pH: 5 (ref 5.0–8.0)

## 2023-12-08 LAB — HEMOGLOBIN A1C
Hgb A1c MFr Bld: 7.2 % — ABNORMAL HIGH (ref 4.8–5.6)
Mean Plasma Glucose: 159.94 mg/dL

## 2023-12-08 LAB — CBG MONITORING, ED
Glucose-Capillary: 136 mg/dL — ABNORMAL HIGH (ref 70–99)
Glucose-Capillary: 192 mg/dL — ABNORMAL HIGH (ref 70–99)

## 2023-12-08 LAB — CORTISOL-AM, BLOOD: Cortisol - AM: 19.2 ug/dL (ref 6.7–22.6)

## 2023-12-08 LAB — PROTIME-INR
INR: 2.3 — ABNORMAL HIGH (ref 0.8–1.2)
Prothrombin Time: 26.4 s — ABNORMAL HIGH (ref 11.4–15.2)

## 2023-12-08 LAB — LACTIC ACID, PLASMA: Lactic Acid, Venous: 1.8 mmol/L (ref 0.5–1.9)

## 2023-12-08 LAB — STREP PNEUMONIAE URINARY ANTIGEN: Strep Pneumo Urinary Antigen: NEGATIVE

## 2023-12-08 LAB — SARS CORONAVIRUS 2 BY RT PCR: SARS Coronavirus 2 by RT PCR: NEGATIVE

## 2023-12-08 MED ORDER — GABAPENTIN 100 MG PO CAPS
200.0000 mg | ORAL_CAPSULE | Freq: Three times a day (TID) | ORAL | Status: DC
  Administered 2023-12-08 – 2023-12-09 (×4): 200 mg via ORAL
  Filled 2023-12-08 (×4): qty 2

## 2023-12-08 MED ORDER — LACTATED RINGERS IV SOLN: 150.0000 mL/h | INTRAVENOUS | Status: DC

## 2023-12-08 MED ORDER — OXYCODONE HCL 5 MG PO TABS
5.0000 mg | ORAL_TABLET | ORAL | Status: DC | PRN
  Administered 2023-12-08 (×2): 5 mg via ORAL
  Filled 2023-12-08 (×2): qty 1

## 2023-12-08 MED ORDER — ONDANSETRON HCL 4 MG/2ML IJ SOLN: 4.0000 mg | Freq: Four times a day (QID) | INTRAMUSCULAR | Status: DC | PRN

## 2023-12-08 MED ORDER — OSIMERTINIB MESYLATE 80 MG PO TABS
80.0000 mg | ORAL_TABLET | Freq: Every day | ORAL | Status: DC
  Administered 2023-12-08 – 2023-12-09 (×2): 80 mg via ORAL
  Filled 2023-12-08 (×2): qty 1

## 2023-12-08 MED ORDER — ONDANSETRON HCL 4 MG PO TABS
4.0000 mg | ORAL_TABLET | Freq: Four times a day (QID) | ORAL | Status: DC | PRN
Start: 2023-12-08 — End: 2023-12-09

## 2023-12-08 MED ORDER — SODIUM CHLORIDE 0.9 % IV SOLN: INTRAVENOUS | Status: AC

## 2023-12-08 MED ORDER — SODIUM CHLORIDE 0.9 % IV SOLN
500.0000 mg | INTRAVENOUS | Status: DC
  Administered 2023-12-08 – 2023-12-09 (×2): 500 mg via INTRAVENOUS
  Filled 2023-12-08 (×2): qty 5

## 2023-12-08 MED ORDER — ACETAMINOPHEN 500 MG PO TABS
1000.0000 mg | ORAL_TABLET | Freq: Once | ORAL | Status: AC
  Administered 2023-12-08: 1000 mg via ORAL
  Filled 2023-12-08: qty 2

## 2023-12-08 MED ORDER — WARFARIN SODIUM 6 MG PO TABS: 6.0000 mg | ORAL_TABLET | Freq: Every day | ORAL | Status: DC

## 2023-12-08 MED ORDER — GABAPENTIN 300 MG PO CAPS
300.0000 mg | ORAL_CAPSULE | Freq: Every day | ORAL | Status: DC
  Administered 2023-12-08: 300 mg via ORAL
  Filled 2023-12-08: qty 1

## 2023-12-08 MED ORDER — WARFARIN SODIUM 6 MG PO TABS
6.0000 mg | ORAL_TABLET | Freq: Every morning | ORAL | Status: DC
  Administered 2023-12-08 – 2023-12-09 (×2): 6 mg via ORAL
  Filled 2023-12-08 (×2): qty 1

## 2023-12-08 MED ORDER — ROPINIROLE HCL 1 MG PO TABS
1.0000 mg | ORAL_TABLET | Freq: Every day | ORAL | Status: DC
  Administered 2023-12-08: 1 mg via ORAL
  Filled 2023-12-08: qty 1

## 2023-12-08 MED ORDER — SODIUM CHLORIDE 0.9 % IV BOLUS
1000.0000 mL | Freq: Once | INTRAVENOUS | Status: AC
  Administered 2023-12-08: 1000 mL via INTRAVENOUS

## 2023-12-08 MED ORDER — SODIUM CHLORIDE 0.9 % IV SOLN
2.0000 g | INTRAVENOUS | Status: DC
  Administered 2023-12-08 – 2023-12-09 (×2): 2 g via INTRAVENOUS
  Filled 2023-12-08 (×2): qty 20

## 2023-12-08 MED ORDER — ACETAMINOPHEN 650 MG RE SUPP: 650.0000 mg | Freq: Four times a day (QID) | RECTAL | Status: DC | PRN

## 2023-12-08 MED ORDER — ACETAMINOPHEN 325 MG PO TABS
650.0000 mg | ORAL_TABLET | Freq: Four times a day (QID) | ORAL | Status: DC | PRN
  Administered 2023-12-09 (×2): 650 mg via ORAL
  Filled 2023-12-08 (×2): qty 2

## 2023-12-08 MED ORDER — PANTOPRAZOLE SODIUM 40 MG PO TBEC
40.0000 mg | DELAYED_RELEASE_TABLET | Freq: Every day | ORAL | Status: DC
  Administered 2023-12-08 – 2023-12-09 (×2): 40 mg via ORAL
  Filled 2023-12-08 (×2): qty 1

## 2023-12-08 MED ORDER — WARFARIN - PHARMACIST DOSING INPATIENT
Freq: Every day | Status: DC
  Filled 2023-12-08: qty 1

## 2023-12-08 MED ORDER — INSULIN ASPART 100 UNIT/ML IJ SOLN: 0.0000 [IU] | Freq: Every day | INTRAMUSCULAR | Status: DC

## 2023-12-08 MED ORDER — INSULIN ASPART 100 UNIT/ML IJ SOLN
0.0000 [IU] | Freq: Three times a day (TID) | INTRAMUSCULAR | Status: DC
  Administered 2023-12-08: 2 [IU] via SUBCUTANEOUS
  Administered 2023-12-08 – 2023-12-09 (×3): 3 [IU] via SUBCUTANEOUS
  Filled 2023-12-08 (×4): qty 1

## 2023-12-08 NOTE — ED Notes (Signed)
 Okay per pharmacy to give pt 6mg  Warfarin based on recent INR.

## 2023-12-08 NOTE — Assessment & Plan Note (Deleted)
 Clear liquid diet Continue IV hydration IV antiemetics and IV Protonix  Follow GI panel and stool for C. difficile

## 2023-12-08 NOTE — Progress Notes (Signed)
 Interim progress note not for billing.  Admitted earlier this morning by my colleague. I have seen and examined the patient, reviewed the chart, and agree with assessment and plan unless stipulated otherwise.   History hgn, lung cancer, t2dm, pe/dvt on coumadin , gi bleeds, chronic pain, presenting with acute onset shaking chills. Also an episode of diarrhea here. Also mild cough. Denies dysuria/frequency/urgency. Reports feeling weak but overall much better. Normal vitals. Mildly elevated lactate has normalized, labs otherwise stable. Admitted for pneumonia/uti. Ua obtained after abx and no culture ordered, this has been added on but as patient denies uti symptoms will hold on treating uti specifically. Given CTA signs of possible pneumonia will continue treatment for that with ceftriaxone /azithromycin . W/u of pneumonia is indicated, will check respiratory panels in addition to urine antigens. Blood cultures are cooking. Given report of generalized weakness will order PT consult. Also if more diarrhea will check stool studies.

## 2023-12-08 NOTE — Assessment & Plan Note (Signed)
Continue pain meds  

## 2023-12-08 NOTE — Assessment & Plan Note (Signed)
 Secondary to diarrhea Expecting improvement with IV hydration Continue to monitor.  Avoid nephrotoxins

## 2023-12-08 NOTE — Assessment & Plan Note (Signed)
 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

## 2023-12-08 NOTE — Assessment & Plan Note (Signed)
 Clear liquid diet Continue IV hydration IV antiemetics and IV Protonix  Follow GI panel and stool for C. difficile

## 2023-12-08 NOTE — H&P (Signed)
 History and Physical    Patient: Kara [name] FMW:[id number] DOB: August 17, 1950 DOA: 12/07/2023 DOS: the patient was seen and examined on 12/08/2023 PCP: Sadie Manna, MD  Patient coming from: Home  Chief Complaint:  Chief Complaint  Patient presents with   Emesis   Abdominal Pain    HPI: Kara [name] is a 73 y.o. 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 She initially presented with weakness resulting in a fall in which she hit her right side sustained bruising to the left forearm.  She complains of shaking chills without fevers.  She is concerned about a UTI as she is incontinent and wears diapers.  Endorses a mild cough along with shortness of breath which she states is her baseline.  Upon arrival in the emergency room she had a few soft, watery bowel movements.  Denies abdominal pain or vomiting. she was reportedly hypotensive with EMS. In the ED, she was tachycardic to 128 and while initially hypertensive her BP went as low as 91/55 and was fluid resuscitated 105/83.  She was tachypneic to the high 20s to 30s.  Not hypoxic. Labs notable for leukopenia of 2.1, down from 3.8 a week prior.  Lactic acid 1.8.  CMP notable for creatinine of 1.13 up from baseline of 0.64 couple months prior but otherwise unremarkable.  Troponin 15, lipase and LFTs WNL.  Urinalysis with many bacteria and small leukocyte esterase. GI panel and C. difficile ordered from the ED, still pending EKG showing sinus tachycardia at 128.  CT head and C-spine nonacute CT abdomen and pelvis showing new peripheral airspace opacities consistent with acute multifocal infiltrate CTA PE protocol negative for  PE  Patient was treated with broad-spectrum antibiotics of cefepime  metronidazole  and vancomycin  and given sepsis fluids Admission requested    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, 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   CHF (congestive heart failure) (HCC)    followed colon resection, wouldn't let me get up   Chronic anticoagulation    Degenerative disc disease, lumbar    Diverticulitis 2018   Diverticulosis    DVT of lower extremity, bilateral (HCC) 2018   Essential hypertension    History of being hospitalized    1 month ago for 3 days, vomiting, and kink in upper intestine   History of hiatal hernia    Mediastinal adenopathy 04/2023   MRSA nasal colonization    Non-small cell lung cancer (HCC) 2022   right   Obesity (BMI 30-39.9)    Osteoarthritis    Osteoporosis    PE (pulmonary thromboembolism) (HCC) 2018   Pneumonia    Small bowel obstruction (HCC) 03/20/2023   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HCC)    Type 2 diabetes mellitus with obesity (HCC)    Vertigo    Past Surgical History:  Procedure Laterality Date   APPENDECTOMY N/A 02/24/2017   Procedure: Incidental  APPENDECTOMY;  Surgeon: Shelva Dunnings, MD;  Location: [name];  Service: General;  Laterality: N/A;   CARPAL TUNNEL RELEASE Bilateral    CATARACT EXTRACTION W/PHACO Left 11/21/2018   Procedure: CATARACT EXTRACTION PHACO AND INTRAOCULAR LENS PLACEMENT (IOC) LEFT DIABETES;  Surgeon: Jaye Fallow, MD;  Location: [facility];  Service: Ophthalmology;  Laterality: Left;  latex sensitivity Diabetic - oral meds   CATARACT EXTRACTION W/PHACO Right 12/12/2018   Procedure: CATARACT EXTRACTION PHACO AND INTRAOCULAR LENS PLACEMENT (IOC) RIGHT DIABETES;  Surgeon: Jaye Fallow, MD;  Location: [facility];  Service: Ophthalmology;  Laterality: Right;  Diabetes-oral med Latex sensitiviy   COLON RESECTION SIGMOID N/A 11/02/2016    Procedure: COLON RESECTION SIGMOID;  Surgeon: Shelva Dunnings, MD;  Location: [name];  Service: General;  Laterality: N/A;   COLON SURGERY  11/02/2016   COLONOSCOPY WITH PROPOFOL  N/A 02/03/2017   Procedure: COLONOSCOPY WITH PROPOFOL ;  Surgeon: Unk Corinn Skiff, MD;  Location: [name];  Service: Gastroenterology;  Laterality: N/A;   COLOSTOMY N/A 11/02/2016   Procedure: COLOSTOMY;  Surgeon: Shelva Dunnings, MD;  Location: [name];  Service: General;  Laterality: N/A;   COLOSTOMY REVERSAL N/A 02/24/2017   Procedure: COLOSTOMY REVERSAL, ostomy takedown, spleenic flexure mobilization, excision rectal stump/distal sigmoid, anastomosis with suture reinforcement;  Surgeon: Shelva Dunnings, MD;  Location: [name];  Service: General;  Laterality: N/A;   ENDOBRONCHIAL ULTRASOUND N/A 05/16/2023   Procedure: ENDOBRONCHIAL ULTRASOUND;  Surgeon: Tamea Dedra CROME, MD;  Location: [name];  Service: Cardiopulmonary;  Laterality: N/A;   FLEXIBLE BRONCHOSCOPY N/A 05/16/2023   Procedure: FLEXIBLE BRONCHOSCOPY;  Surgeon: Tamea Dedra CROME, MD;  Location: [name];  Service: Cardiopulmonary;  Laterality: N/A;   ILEO LOOP DIVERSION N/A 02/24/2017   Procedure: ILEO LOOP COLOSTOMY;  Surgeon: Shelva Dunnings, MD;  Location: [name];  Service: General;  Laterality: N/A;   ILEOSTOMY CLOSURE N/A 06/01/2017   Procedure: LOOP ILEOSTOMY TAKEDOWN;  Surgeon: Shelva Dunnings, MD;  Location: [name];  Service: General;  Laterality: N/A;   IR IMAGING GUIDED PORT INSERTION  06/14/2023   IR REMOVAL TUN ACCESS W/ PORT W/O FL MOD SED  11/16/2023   IVC FILTER INSERTION Right 10/2016   IVC FILTER REMOVAL N/A 08/09/2017   Procedure: IVC FILTER REMOVAL;  Surgeon: Jama Cordella MATSU, MD;  Location: [name];  Service: Cardiovascular;  Laterality: N/A;   KNEE SURGERY Right    torn menicus   LAPAROSCOPY N/A 02/24/2017   Procedure: LAPAROSCOPY DIAGNOSTIC;  Surgeon: Shelva Dunnings, MD;  Location: [name];  Service: General;  Laterality: N/A;   LYSIS OF ADHESION N/A 02/24/2017   Procedure: LYSIS OF ADHESION;  Surgeon: Shelva Dunnings, MD;  Location: [name];  Service: General;  Laterality: N/A;   PULMONARY VENOGRAPHY N/A 11/19/2016   Procedure: Pulmonary Venography; IVC filter placement; possible pulmonary thrombectomy;  Surgeon: Jama Cordella MATSU, MD;  Location: [name];  Service: Cardiovascular;  Laterality: N/A;   SACROPLASTY N/A 11/08/2017   Procedure: SACROPLASTY S2;  Surgeon: Kathlynn Sharper, MD;  Location: [name];  Service: Orthopedics;  Laterality: N/A;   SIGMOIDOSCOPY N/A 11/13/2016   Procedure: endoscopic  flexible SIGMOIDOSCOPY;  Surgeon: Wonda Charlie BRAVO, MD;  Location: [name];  Service: General;  Laterality: N/A;   SIGMOIDOSCOPY N/A 05/19/2017   Procedure: ENID MORIN;  Surgeon: Shelva Dunnings, MD;  Location: [name];  Service: General;  Laterality: N/A;   VIDEO BRONCHOSCOPY WITH ENDOBRONCHIAL NAVIGATION N/A 02/23/2021   Procedure: ROBOTIC VIDEO BRONCHOSCOPY WITH ENDOBRONCHIAL NAVIGATION;  Surgeon: Tamea Dedra CROME, MD;  Location: [name];  Service: Pulmonary;  Laterality: N/A;   Social History:  reports that she has never smoked. She has been exposed to tobacco smoke. She has never used smokeless tobacco. She reports that she does not drink alcohol  and does  not use drugs.  Allergies  Allergen Reactions   Adhesive [Tape] Other (See Comments)    Pt reports, when removing tape from skin most tapes pull her skin off. Ok to use paper tape   Avapro  [Irbesartan ] Anaphylaxis    Facial swelling   Morphine  And Codeine  Nausea And Vomiting   Other Hives    Chlorhexadine wipes/CHG wipes,    Latex Rash    Exam gloves/ does not react around elastic and lips don't swell when blowing balloons    Family History  Problem Relation Age of Onset   Diabetes Mother    Heart disease Mother    Cancer Mother    Breast cancer Mother        >50   Heart  disease Father    Diabetes Sister    Heart disease Sister     Prior to Admission medications   Medication Sig Start Date End Date Taking? Authorizing Provider  bisacodyl  (DULCOLAX) 10 MG suppository Place 1 suppository (10 mg total) rectally as needed for moderate constipation. 07/06/23   Viviann Pastor, MD  candesartan (ATACAND) 16 MG tablet Take 16 mg by mouth in the morning. 08/03/17   [provider]  cholecalciferol  (VITAMIN D ) 25 MCG (1000 UNIT) tablet Take 1,000 Units by mouth in the morning.    [provider]  Cyanocobalamin  (VITAMIN B-12) 5000 MCG TBDP Take 5,000 mcg by mouth in the morning.    [provider]  cyclobenzaprine  (FLEXERIL ) 10 MG tablet Take 10 mg by mouth at bedtime. 07/05/16   [provider]  Dulaglutide (TRULICITY) 1.5 MG/0.5ML SOAJ Inject 1.5 mg into the skin once a week. Saturday 07/28/23 07/27/24  [provider]  gabapentin  (NEURONTIN ) 100 MG capsule Take 200 mg by mouth with breakfast, with lunch, and with evening meal.    [provider]  gabapentin  (NEURONTIN ) 300 MG capsule Take 300 mg by mouth at bedtime.    [provider]  glipiZIDE  (GLUCOTROL ) 10 MG tablet Take 10 mg by mouth daily before breakfast.    [provider]  HYDROcodone -acetaminophen  (NORCO/VICODIN) 5-325 MG tablet Take 1 tablet by mouth every 6 (six) hours as needed for moderate pain.    [provider]  Magnesium Oxide -Mg Supplement 500 MG TABS Take 1 tablet by mouth at bedtime.    [provider]  omeprazole  (PRILOSEC) 20 MG capsule Take 1 capsule (20 mg total) by mouth daily. 08/30/23   Dasie Tinnie MATSU, NP  ondansetron  (ZOFRAN ) 8 MG tablet TAKE 1 TABLET BY MOUTH EVERY 8 HOURS AS NEEDED FOR NAUSEA OR VOMITING. START ON THE 3RD DAY AFTER CHEMOTHERAPY 08/15/23   Melanee Annah BROCKS, MD  [facility] ULTRA TEST test strip  01/25/23   [provider]  osimertinib  mesylate (TAGRISSO ) 80 MG tablet Take 1 tablet (80 mg  total) by mouth daily. 11/17/23   Melanee Annah BROCKS, MD  oxybutynin  (DITROPAN ) 5 MG tablet Take 1 tablet by mouth 2 (two) times daily. 02/25/22   [provider]  potassium chloride  SA (KLOR-CON  M) 20 MEQ tablet Take 1 tablet (20 mEq total) by mouth daily. 07/22/23   Melanee Annah BROCKS, MD  prochlorperazine  (COMPAZINE ) 10 MG tablet Take 1 tablet (10 mg total) by mouth every 6 (six) hours as needed for nausea or vomiting. 08/30/23   Dasie Tinnie MATSU, NP  rOPINIRole  (REQUIP ) 1 MG tablet Take 1 mg by mouth at bedtime. 06/22/23   [provider]  sucralfate  (CARAFATE ) 1 g tablet Take 1 tablet (  1 g total) by mouth 3 (three) times daily with meals. Dissolve tablet in 4 tablesppons warm water swish and swallow 30 min before meals. Patient taking differently: Take 1 g by mouth 3 (three) times daily with meals. Dissolve tablet in 4 tablespoons warm water swish and swallow 30 min before meals. 07/22/23 11/29/23  Lenn Aran, MD  warfarin (COUMADIN ) 6 MG tablet Take 6 mg by mouth at bedtime. 06/01/23   [provider]    Physical Exam: Vitals:   12/08/23 0330 12/08/23 0350 12/08/23 0420 12/08/23 0424  BP: 105/85 (!) 123/97 (!) 163/79   Pulse: 100 (!) 132 (!) 129   Resp: (!) 26 (!) 24 (!) 39 (!) 32  Temp:      TempSrc:      SpO2: 98%  95%    Physical Exam Vitals and nursing note reviewed.  Constitutional:      General: She is not in acute distress. HENT:     Head: Normocephalic and atraumatic.  Cardiovascular:     Rate and Rhythm: Regular rhythm. Tachycardia present.     Heart sounds: Normal heart sounds.  Pulmonary:     Effort: Tachypnea present.     Breath sounds: Normal breath sounds.  Abdominal:     Palpations: Abdomen is soft.     Tenderness: There is no abdominal tenderness.  Musculoskeletal:     Comments: Circumferential bruising left forearm-see pic  Neurological:     Mental Status: Mental status is at baseline.        Labs on Admission: I have personally reviewed  following labs and imaging studies  CBC: Recent Labs  Lab 12/07/23 1858  WBC 2.1*  HGB 13.2  HCT 40.7  MCV 93.6  PLT 83*   Basic Metabolic Panel: Recent Labs  Lab 12/07/23 1858  NA 140  K 4.0  CL 108  CO2 21*  GLUCOSE 182*  BUN 13  CREATININE 1.13*  CALCIUM  9.2   GFR: Estimated Creatinine Clearance (by C-G formula based on SCr of 1.13 mg/dL (H)) Female: 53.4 mL/min (A) Female: 56.9 mL/min (A) Liver Function Tests: Recent Labs  Lab 12/07/23 1858  AST 31  ALT 18  ALKPHOS 61  BILITOT 0.9  PROT 7.7  ALBUMIN 3.4*   Recent Labs  Lab 12/07/23 1858  LIPASE 26   No results for input(s): AMMONIA in the last 168 hours. Coagulation Profile: No results for input(s): INR, PROTIME in the last 168 hours. Cardiac Enzymes: No results for input(s): CKTOTAL, CKMB, CKMBINDEX, TROPONINI in the last 168 hours. BNP (last 3 results) No results for input(s): PROBNP in the last 8760 hours. HbA1C: No results for input(s): HGBA1C in the last 72 hours. CBG: No results for input(s): GLUCAP in the last 168 hours. Lipid Profile: No results for input(s): CHOL, HDL, LDLCALC, TRIG, CHOLHDL, LDLDIRECT in the last 72 hours. Thyroid  Function Tests: No results for input(s): TSH, T4TOTAL, FREET4, T3FREE, THYROIDAB in the last 72 hours. Anemia Panel: No results for input(s): VITAMINB12, FOLATE, FERRITIN, TIBC, IRON, RETICCTPCT in the last 72 hours. Urine analysis:    Component Value Date/Time   COLORURINE YELLOW (A) 12/08/2023 0132   APPEARANCEUR HAZY (A) 12/08/2023 0132   APPEARANCEUR Clear 05/29/2013 0631   LABSPEC 1.011 12/08/2023 0132   LABSPEC 1.015 05/29/2013 0631   PHURINE 5.0 12/08/2023 0132   GLUCOSEU NEGATIVE 12/08/2023 0132   GLUCOSEU Negative 05/29/2013 0631   HGBUR MODERATE (A) 12/08/2023 0132   BILIRUBINUR NEGATIVE 12/08/2023 0132   BILIRUBINUR Negative 05/29/2013 0631  KETONESUR 5 (A) 12/08/2023 0132   PROTEINUR 30  (A) 12/08/2023 0132   NITRITE NEGATIVE 12/08/2023 0132   LEUKOCYTESUR SMALL (A) 12/08/2023 0132   LEUKOCYTESUR Negative 05/29/2013 0631    Radiological Exams on Admission: CT Angio Chest PE W and/or Wo Contrast Result Date: 12/08/2023 CLINICAL DATA:  Acute chest and abdominal pain, history of recent fall, initial encounter EXAM: CT ANGIOGRAPHY CHEST CT ABDOMEN AND PELVIS WITH CONTRAST TECHNIQUE: Multidetector CT imaging of the chest was performed using the standard protocol during bolus administration of intravenous contrast. Multiplanar CT image reconstructions and MIPs were obtained to evaluate the vascular anatomy. Multidetector CT imaging of the abdomen and pelvis was performed using the standard protocol during bolus administration of intravenous contrast. RADIATION DOSE REDUCTION: This exam was performed according to the departmental dose-optimization program which includes automated exposure control, adjustment of the mA and/or kV according to patient size and/or use of iterative reconstruction technique. CONTRAST:  OMNIPAQUE  IOHEXOL  350 MG/ML SOLN COMPARISON:  10/06/2023 FINDINGS: CTA CHEST FINDINGS Cardiovascular: Atherosclerotic calcifications of the thoracic aorta are noted. No aneurysmal dilatation or dissection is seen. No cardiac enlargement is noted. The pulmonary artery shows a normal branching pattern bilaterally. No filling defect to suggest pulmonary embolism is noted. Mediastinum/Nodes: Thoracic inlet is within normal limits. Small mediastinal nodes are seen near the AP window and along the left pulmonary artery. These were not well visualized on the prior exam. These may be reactive in nature as they are not significant by size criteria. Previously seen right paratracheal nodes are less prominent on today's exam. The esophagus as visualized is within normal limits. Lungs/Pleura: The left lung is well aerated without focal infiltrate or sizable effusion. Right lung again  demonstrates right hilar and upper lobe density similar to that seen on the prior exam consistent with post treatment changes. Some patchy opacities are noted in the right upper lobe as well as in the medial aspect of the right middle lobe and right lower lobe new from the prior exam felt to represent acute infiltrate. Minimal right pleural effusion is noted. No parenchymal nodule is seen. Musculoskeletal: Degenerative changes of the thoracic spine are seen. No compression deformity is seen. Review of the MIP images confirms the above findings. CT ABDOMEN and PELVIS FINDINGS Hepatobiliary: Liver is within normal limits. Gallbladder is well distended with multiple dependent gallstones stable from the prior exam. Pancreas: Unremarkable. No pancreatic ductal dilatation or surrounding inflammatory changes. Spleen: Normal in size without focal abnormality. Adrenals/Urinary Tract: Adrenal glands are within normal limits. Kidneys demonstrate a normal enhancement pattern bilaterally. No obstructive changes are seen. The bladder is well distended. Stomach/Bowel: No obstructive or inflammatory changes of the colon are noted. A loop of transverse colon extends into a large ventral hernia. This is stable in appearance from the prior exam. No obstructive changes are seen. The appendix is not visualized consistent with a prior surgical history. Small bowel and stomach are unremarkable. Vascular/Lymphatic: Aortic atherosclerosis. No enlarged abdominal or pelvic lymph nodes. Reproductive: Uterus and bilateral adnexa are unremarkable. Other: Ventral hernia is again seen and stable with a loop of transverse colon within. No free fluid is noted. No abdominopelvic ascites. Musculoskeletal: Degenerative changes of lumbar spine are seen. Review of the MIP images confirms the above findings. IMPRESSION: CTA of the chest: No evidence of pulmonary emboli. Post treatment changes in the right hilum and extending into the right upper lobe  similar to that seen on the prior exam. There are some new peripheral airspace  opacities identified consistent with acute multifocal infiltrate. CT of the abdomen and pelvis: Cholelithiasis without complicating factors. Large ventral hernia containing transverse colon without obstructive change. Electronically Signed   By: Oneil Devonshire M.D.   On: 12/08/2023 01:48   CT ABDOMEN PELVIS W CONTRAST Result Date: 12/08/2023 CLINICAL DATA:  Acute chest and abdominal pain, history of recent fall, initial encounter EXAM: CT ANGIOGRAPHY CHEST CT ABDOMEN AND PELVIS WITH CONTRAST TECHNIQUE: Multidetector CT imaging of the chest was performed using the standard protocol during bolus administration of intravenous contrast. Multiplanar CT image reconstructions and MIPs were obtained to evaluate the vascular anatomy. Multidetector CT imaging of the abdomen and pelvis was performed using the standard protocol during bolus administration of intravenous contrast. RADIATION DOSE REDUCTION: This exam was performed according to the departmental dose-optimization program which includes automated exposure control, adjustment of the mA and/or kV according to patient size and/or use of iterative reconstruction technique. CONTRAST:  OMNIPAQUE  IOHEXOL  350 MG/ML SOLN COMPARISON:  10/06/2023 FINDINGS: CTA CHEST FINDINGS Cardiovascular: Atherosclerotic calcifications of the thoracic aorta are noted. No aneurysmal dilatation or dissection is seen. No cardiac enlargement is noted. The pulmonary artery shows a normal branching pattern bilaterally. No filling defect to suggest pulmonary embolism is noted. Mediastinum/Nodes: Thoracic inlet is within normal limits. Small mediastinal nodes are seen near the AP window and along the left pulmonary artery. These were not well visualized on the prior exam. These may be reactive in nature as they are not significant by size criteria. Previously seen right paratracheal nodes are less prominent on  today's exam. The esophagus as visualized is within normal limits. Lungs/Pleura: The left lung is well aerated without focal infiltrate or sizable effusion. Right lung again demonstrates right hilar and upper lobe density similar to that seen on the prior exam consistent with post treatment changes. Some patchy opacities are noted in the right upper lobe as well as in the medial aspect of the right middle lobe and right lower lobe new from the prior exam felt to represent acute infiltrate. Minimal right pleural effusion is noted. No parenchymal nodule is seen. Musculoskeletal: Degenerative changes of the thoracic spine are seen. No compression deformity is seen. Review of the MIP images confirms the above findings. CT ABDOMEN and PELVIS FINDINGS Hepatobiliary: Liver is within normal limits. Gallbladder is well distended with multiple dependent gallstones stable from the prior exam. Pancreas: Unremarkable. No pancreatic ductal dilatation or surrounding inflammatory changes. Spleen: Normal in size without focal abnormality. Adrenals/Urinary Tract: Adrenal glands are within normal limits. Kidneys demonstrate a normal enhancement pattern bilaterally. No obstructive changes are seen. The bladder is well distended. Stomach/Bowel: No obstructive or inflammatory changes of the colon are noted. A loop of transverse colon extends into a large ventral hernia. This is stable in appearance from the prior exam. No obstructive changes are seen. The appendix is not visualized consistent with a prior surgical history. Small bowel and stomach are unremarkable. Vascular/Lymphatic: Aortic atherosclerosis. No enlarged abdominal or pelvic lymph nodes. Reproductive: Uterus and bilateral adnexa are unremarkable. Other: Ventral hernia is again seen and stable with a loop of transverse colon within. No free fluid is noted. No abdominopelvic ascites. Musculoskeletal: Degenerative changes of lumbar spine are seen. Review of the MIP images  confirms the above findings. IMPRESSION: CTA of the chest: No evidence of pulmonary emboli. Post treatment changes in the right hilum and extending into the right upper lobe similar to that seen on the prior exam. There are some new peripheral airspace  opacities identified consistent with acute multifocal infiltrate. CT of the abdomen and pelvis: Cholelithiasis without complicating factors. Large ventral hernia containing transverse colon without obstructive change. Electronically Signed   By: Oneil Devonshire M.D.   On: 12/08/2023 01:48   CT HEAD WO CONTRAST ( ) Result Date: 12/08/2023 CLINICAL DATA:  Recent fall with headaches and neck pain, initial encounter EXAM: CT HEAD WITHOUT CONTRAST CT CERVICAL SPINE WITHOUT CONTRAST TECHNIQUE: Multidetector CT imaging of the head and cervical spine was performed following the standard protocol without intravenous contrast. Multiplanar CT image reconstructions of the cervical spine were also generated. RADIATION DOSE REDUCTION: This exam was performed according to the departmental dose-optimization program which includes automated exposure control, adjustment of the mA and/or kV according to patient size and/or use of iterative reconstruction technique. COMPARISON:  10/03/2023 FINDINGS: CT HEAD FINDINGS Brain: No evidence of acute infarction, hemorrhage, hydrocephalus, extra-axial collection or mass lesion/mass effect. Vascular: No hyperdense vessel or unexpected calcification. Skull: Normal. Negative for fracture or focal lesion. Sinuses/Orbits: No acute finding. Other: None. CT CERVICAL SPINE FINDINGS Alignment: Within normal limits. Skull base and vertebrae: 7 cervical segments are well visualized. Vertebral body height is well maintained. Mild facet hypertrophic changes are seen. Minimal osteophytic changes are seen. No acute fracture or acute facet abnormality is noted. The odontoid is within normal limits. Soft tissues and spinal canal: Surrounding soft tissue  structures are within normal limits. Upper chest: Visualized lung apices are unremarkable. Other: None IMPRESSION: CT of the head: No acute intracranial abnormality noted. CT of the cervical spine: Mild degenerative change without acute abnormality. Electronically Signed   By: Oneil Devonshire M.D.   On: 12/08/2023 01:38   CT Cervical Spine Wo Contrast Result Date: 12/08/2023 CLINICAL DATA:  Recent fall with headaches and neck pain, initial encounter EXAM: CT HEAD WITHOUT CONTRAST CT CERVICAL SPINE WITHOUT CONTRAST TECHNIQUE: Multidetector CT imaging of the head and cervical spine was performed following the standard protocol without intravenous contrast. Multiplanar CT image reconstructions of the cervical spine were also generated. RADIATION DOSE REDUCTION: This exam was performed according to the departmental dose-optimization program which includes automated exposure control, adjustment of the mA and/or kV according to patient size and/or use of iterative reconstruction technique. COMPARISON:  10/03/2023 FINDINGS: CT HEAD FINDINGS Brain: No evidence of acute infarction, hemorrhage, hydrocephalus, extra-axial collection or mass lesion/mass effect. Vascular: No hyperdense vessel or unexpected calcification. Skull: Normal. Negative for fracture or focal lesion. Sinuses/Orbits: No acute finding. Other: None. CT CERVICAL SPINE FINDINGS Alignment: Within normal limits. Skull base and vertebrae: 7 cervical segments are well visualized. Vertebral body height is well maintained. Mild facet hypertrophic changes are seen. Minimal osteophytic changes are seen. No acute fracture or acute facet abnormality is noted. The odontoid is within normal limits. Soft tissues and spinal canal: Surrounding soft tissue structures are within normal limits. Upper chest: Visualized lung apices are unremarkable. Other: None IMPRESSION: CT of the head: No acute intracranial abnormality noted. CT of the cervical spine: Mild degenerative change  without acute abnormality. Electronically Signed   By: Oneil Devonshire M.D.   On: 12/08/2023 01:38   Data Reviewed for HPI: Relevant notes from primary care and specialist visits, past discharge summaries as available in EHR, including Care Everywhere. Prior diagnostic testing as pertinent to current admission diagnoses Updated medications and problem lists for reconciliation ED course, including vitals, labs, imaging, treatment and response to treatment Triage notes, nursing and pharmacy notes and ED provider's notes Notable results as noted above in HPI  Assessment and Plan: * Sepsis due to pneumonia (HCC) Sepsis criteria include tachycardia, hypotension, tachypnea, AKI, leukopenia CT chest showing airspace disease/multifocal infiltrate Rocephin  and azithromycin  Sepsis fluids Antitussives Albuterol  as needed Incentive spirometer  Acute diarrhea Clear liquid diet Continue IV hydration IV antiemetics and IV Protonix  Follow GI panel and stool for C. difficile  AKI (acute kidney injury) (HCC) Secondary to diarrhea Expecting improvement with IV hydration Continue to monitor.  Avoid nephrotoxins  Abnormal urinalysis History of Klebsiella UTI with sepsis 07/2023 Follow urine culture Currently on Rocephin  for pneumonia  History of pulmonary embolism History of recurrent DVT s/p IVC filter Chronic warfarin anticoagulation No acute issues suspected Pharmacy consult for Coumadin  management  Contusion of elbow and forearm, left, initial encounter Extensive bruising Monitor for worsening Neurovascularly intact, no signs of compartment syndrome  Long term current use of opiate analgesic Continue pain meds  Diabetes mellitus without complication (HCC) Sliding scale insulin  coverage  Chemotherapy induced neutropenia (HCC) Recurrent adenocarcinoma right upper lung WBC 2.1 Continue to monitor  Benign essential hypertension Hold antihypertensives due to soft blood  pressure        DVT prophylaxis: warfarin  Consults: none  Advance Care Planning:   Code Status: Prior   Family Communication: none  Disposition Plan: Back to previous home environment  Severity of Illness: The appropriate patient status for this patient is OBSERVATION. Observation status is judged to be reasonable and necessary in order to provide the required intensity of service to ensure the patient's safety. The patient's presenting symptoms, physical exam findings, and initial radiographic and laboratory data in the context of their medical condition is felt to place them at decreased risk for further clinical deterioration. Furthermore, it is anticipated that the patient will be medically stable for discharge from the hospital within 2 midnights of admission.   Author: Delayne LULLA Solian, MD 12/08/2023 4:36 AM  For on call review [email or web address].

## 2023-12-08 NOTE — ED Notes (Signed)
 Pt taken to room 16 via w/c; incont of mod amount soft loose stool; pt assisted to change into hosp gown & perineal care performed; skin intact; attends in place; pt assisted into bed with warm blankets, card monitor in place; report given to MARLA Code RN

## 2023-12-08 NOTE — Progress Notes (Signed)
 CODE SEPSIS - PHARMACY COMMUNICATION  **Broad Spectrum Antibiotics should be administered within 1 hour of Sepsis diagnosis**  Time Code Sepsis Called/Page Received: 7/16 @ 2238   Antibiotics Ordered: Cefepime , Vanc, metronidazole    Time of 1st antibiotic administration: Cefepime  2 gm IV X 1 on 7/16 @ 2330   Additional action taken by pharmacy:   If necessary, Name of Provider/Nurse Contacted:     Lariya Kinzie D ,PharmD Clinical Pharmacist  12/08/2023  1:29 AM

## 2023-12-08 NOTE — Assessment & Plan Note (Signed)
 Sliding scale insulin coverage

## 2023-12-08 NOTE — ED Notes (Signed)
 Called CCMD to initiate cardiac monitoring.

## 2023-12-08 NOTE — ED Notes (Signed)
 Patient assisted to toilet and back to bed without incident. CB in reach.

## 2023-12-08 NOTE — Assessment & Plan Note (Signed)
Hold antihypertensives due to soft blood pressure

## 2023-12-08 NOTE — Assessment & Plan Note (Signed)
 Extensive bruising Monitor for worsening Neurovascularly intact, no signs of compartment syndrome

## 2023-12-08 NOTE — ED Notes (Signed)
 Breakfast tray provided.

## 2023-12-08 NOTE — ED Notes (Signed)
 Pt complaining of shaking. HR 130s. Denies CP, SOB, dizziness. States this is what she felt like at home before she came to ED. Vitals rechecked. Cyrena MD made aware

## 2023-12-08 NOTE — Progress Notes (Addendum)
 PHARMACY - ANTICOAGULATION CONSULT NOTE  Pharmacy Consult for Warfarin  Indication: DVT  Allergies  Allergen Reactions   Adhesive [Tape] Other (See Comments)    Pt reports, when removing tape from skin most tapes pull her skin off. Ok to use paper tape   Avapro  [Irbesartan ] Anaphylaxis    Facial swelling   Morphine  And Codeine  Nausea And Vomiting   Other Hives    Chlorhexadine wipes/CHG wipes,    Latex Rash    Exam gloves/ does not react around elastic and lips don't swell when blowing balloons    Patient Measurements:    Vital Signs: Temp: 98.5 F (36.9 C) (07/17 0536) Temp Source: Oral (07/17 0536) BP: 137/72 (07/17 0440) Pulse Rate: 113 (07/17 0620)  Labs: Recent Labs    12/07/23 1858 12/08/23 0527  HGB 13.2  --   HCT 40.7  --   PLT 83*  --   LABPROT  --  26.4*  INR  --  2.3*  CREATININE 1.13*  --   TROPONINIHS 15  --     Estimated Creatinine Clearance (by C-G formula based on SCr of 1.13 mg/dL (H)) Female: 53.4 mL/min (A) Female: 56.9 mL/min (A)   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, 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   CHF (congestive heart failure) (HCC)    followed colon resection, wouldn't let me get up   Chronic anticoagulation    Degenerative disc disease, lumbar    Diverticulitis 2018   Diverticulosis    DVT of lower extremity, bilateral (HCC) 2018   Essential hypertension    History of being hospitalized    1 month ago for 3 days, vomiting, and kink in upper intestine   History of hiatal hernia    Mediastinal adenopathy 04/2023   MRSA nasal colonization    Non-small cell lung cancer (HCC) 2022   right   Obesity (BMI 30-39.9)    Osteoarthritis    Osteoporosis    PE (pulmonary thromboembolism) (HCC) 2018   Pneumonia    Small bowel obstruction (HCC) 03/20/2023   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HCC)    Type 2 diabetes mellitus with obesity (HCC)    Vertigo      Medications:  (Not in a hospital admission)   Assessment: Pharmacy consulted to dose warfarin for DVT in this 73 year old female admitted with PNA, sepsis.  Pt was on warfarin 6 mg PO QAM PTA, last dose on 7/16 AM.   7/17:  INR @ 0527 = 2.3, therapeutic   Goal of Therapy:  INR 2-3   Plan:  - Continue Warfarin 6 mg PO QAM to resume 7/17.  - Check INR daily  - CBC daily   Tashanda Fuhrer D 12/08/2023,6:38 AM

## 2023-12-08 NOTE — Assessment & Plan Note (Signed)
 Sepsis criteria include tachycardia, hypotension, tachypnea, AKI, leukopenia CT chest showing airspace disease/multifocal infiltrate Rocephin  and azithromycin  Sepsis fluids Antitussives Albuterol  as needed Incentive spirometer

## 2023-12-08 NOTE — Assessment & Plan Note (Signed)
 History of recurrent DVT s/p IVC filter Chronic warfarin anticoagulation No acute issues suspected Pharmacy consult for Coumadin  management

## 2023-12-08 NOTE — Plan of Care (Signed)
  Problem: Clinical Measurements: Goal: Ability to maintain clinical measurements within normal limits will improve Outcome: Progressing   Problem: Clinical Measurements: Goal: Respiratory complications will improve Outcome: Progressing   Problem: Clinical Measurements: Goal: Cardiovascular complication will be avoided Outcome: Progressing   Problem: Activity: Goal: Risk for activity intolerance will decrease Outcome: Progressing   

## 2023-12-08 NOTE — ED Provider Notes (Signed)
 Pending urinalysis and CT scans in this patient with sepsis.  Scans look like multifocal pneumonia and urinalysis looks infected.  She had a got broad-spectrum antibiotics and fluids and her hemodynamics have stabilized well.  Admission to hospital service.   Cyrena Mylar, MD 12/08/23 [phone]

## 2023-12-08 NOTE — ED Notes (Signed)
 Patient changed from urine incontinence including brief and chucks.

## 2023-12-08 NOTE — Evaluation (Signed)
 Physical Therapy Evaluation Patient Details Name: Kara [name] MRN: [id number] DOB: 12-20-1950 Today's Date: 12/08/2023  History of Present Illness  Pt is a 73 y/o F admitted on 12/07/23 after presenting with c/o a fall, concerns for UTI. Pt is being treated for sepsis 2/2 PNA. PMH: recurrent adenocarcinoma of RUL on chemo & radiation, recurrent PE/DVT s/p IVC, DM2, HTN, UTI, hiatal hernia, vertigo  Clinical Impression  Pt seen for PT evaluation with pt agreeable to tx. Pt reports prior to admission she was mod I with mobility. Pt reports she fell off the couch but unsure why/what happened, unable to recall events leading to admission but is AxOx4 & appears with baseline cognition during evaluation. Pt is able to ambulate laps in room with IV pole with supervision, no LOB. Pt reports she appears close to baseline. Will continue to follow pt acutely to progress gait & for stair negotiation.        If plan is discharge home, recommend the following: Assistance with cooking/housework;Assist for transportation;Help with stairs or ramp for entrance   Can travel by private vehicle        Equipment Recommendations None recommended by PT  Recommendations for Other Services       Functional Status Assessment Patient has had a recent decline in their functional status and demonstrates the ability to make significant improvements in function in a reasonable and predictable amount of time.     Precautions / Restrictions Precautions Precautions: Fall Restrictions Weight Bearing Restrictions Per Provider Order: No      Mobility  Bed Mobility Overal bed mobility: Needs Assistance Bed Mobility: Supine to Sit     Supine to sit: Modified independent (Device/Increase time)     General bed mobility comments: exit R side of ED stretcher, HOB almost flat    Transfers Overall transfer level: Needs assistance Equipment used:  (IV pole) Transfers: Sit to/from Stand Sit to Stand:  Supervision                Ambulation/Gait Ambulation/Gait assistance: Supervision Gait Distance (Feet): 50 Feet Assistive device: IV Pole Gait Pattern/deviations: Decreased step length - right, Decreased step length - left, Decreased stride length, Trunk flexed Gait velocity: slightly decreased     General Gait Details: Ambulates laps in room with IV pole (1<>2 BUE support), no LOB noted, does lean anteriorly on IV pole for support similarly to as if using a rollator.  Stairs            Wheelchair Mobility     Tilt Bed    Modified Rankin (Stroke Patients Only)       Balance Overall balance assessment: Needs assistance Sitting-balance support: Feet unsupported, No upper extremity supported Sitting balance-Leahy Scale: Good Sitting balance - Comments: sitting edge of ED stretcher with BLE not touching floor, no LOB   Standing balance support: During functional activity, No upper extremity supported, Single extremity supported Standing balance-Leahy Scale: Good                               Pertinent Vitals/Pain Pain Assessment Pain Assessment: Faces Faces Pain Scale: Hurts little more Pain Location: chronic back pain & discomfort 2/2 bed Pain Descriptors / Indicators: Discomfort Pain Intervention(s): Monitored during session    Home Living Family/patient expects to be discharged to:: Private residence Living Arrangements: Spouse/significant other Available Help at Discharge: Family Type of Home: House Home Access: Stairs to enter Entrance Stairs-Rails: Right;Left;Can reach  both Entrance Stairs-Number of Steps: 3+1   Home Layout: One level Home Equipment: Rollator (4 wheels);Cane - single point;Shower seat      Prior Function               Mobility Comments: Ambulatory with rollator in the the home, SPC outside of the home, sits to take breaks PRN, driving, cares for someone 3 hours/day, 6 days/week.       Extremity/Trunk  Assessment   Upper Extremity Assessment Upper Extremity Assessment: Overall WFL for tasks assessed    Lower Extremity Assessment Lower Extremity Assessment: Generalized weakness    Cervical / Trunk Assessment Cervical / Trunk Assessment:  (chronic back pain)  Communication   Communication Communication: No apparent difficulties    Cognition Arousal: Alert Behavior During Therapy: WFL for tasks assessed/performed   PT - Cognitive impairments: No apparent impairments                       PT - Cognition Comments: Pt reports she does not remember coming to the hospital/being in waiting room/what happened but is AxOx4, presents at baseline at time of evaluation. Following commands: Intact       Cueing Cueing Techniques: Verbal cues     General Comments General comments (skin integrity, edema, etc.): VSS    Exercises     Assessment/Plan    PT Assessment Patient needs continued PT services  PT Problem List Decreased strength;Decreased activity tolerance;Decreased balance;Decreased mobility;Decreased range of motion       PT Treatment Interventions DME instruction;Balance training;Modalities;Neuromuscular re-education;Gait training;Stair training;Functional mobility training;Therapeutic activities;Therapeutic exercise;Patient/family education    PT Goals (Current goals can be found in the Care Plan section)  Acute Rehab PT Goals Patient Stated Goal: get better PT Goal Formulation: With patient Time For Goal Achievement: 12/22/23 Potential to Achieve Goals: Good    Frequency Min 2X/week     Co-evaluation               AM-PAC PT 6 Clicks Mobility  Outcome Measure Help needed turning from your back to your side while in a flat bed without using bedrails?: None Help needed moving from lying on your back to sitting on the side of a flat bed without using bedrails?: None Help needed moving to and from a bed to a chair (including a wheelchair)?: A  Little Help needed standing up from a chair using your arms (e.g., wheelchair or bedside chair)?: A Little Help needed to walk in hospital room?: A Little Help needed climbing 3-5 steps with a railing? : A Little 6 Click Score: 20    End of Session   Activity Tolerance: Patient tolerated treatment well Patient left: in chair;with call bell/phone within reach;with chair alarm set Nurse Communication: Mobility status PT Visit Diagnosis: Unsteadiness on feet (R26.81);Muscle weakness (generalized) (M62.81)    Time: 1345-1406 PT Time Calculation (min) (ACUTE ONLY): 21 min   Charges:   PT Evaluation $PT Eval Low Complexity: 1 Low   PT General Charges $$ ACUTE PT VISIT: 1 Visit         Richerd Pinal, PT, DPT 12/08/23, 2:20 PM   Richerd CHRISTELLA Pinal 12/08/2023, 2:19 PM

## 2023-12-08 NOTE — Assessment & Plan Note (Signed)
 History of Klebsiella UTI with sepsis 07/2023 Follow urine culture Currently on Rocephin  for pneumonia

## 2023-12-09 ENCOUNTER — Other Ambulatory Visit: Payer: Self-pay

## 2023-12-09 DIAGNOSIS — J189 Pneumonia, unspecified organism: Secondary | ICD-10-CM | POA: Diagnosis not present

## 2023-12-09 DIAGNOSIS — A419 Sepsis, unspecified organism: Secondary | ICD-10-CM | POA: Diagnosis not present

## 2023-12-09 LAB — CBC
HCT: 32.7 % — ABNORMAL LOW (ref 36.0–46.0)
Hemoglobin: 10.5 g/dL — ABNORMAL LOW (ref 12.0–15.0)
MCH: 30.5 pg (ref 26.0–34.0)
MCHC: 32.1 g/dL (ref 30.0–36.0)
MCV: 95.1 fL (ref 80.0–100.0)
Platelets: 64 K/uL — ABNORMAL LOW (ref 150–400)
RBC: 3.44 MIL/uL — ABNORMAL LOW (ref 3.87–5.11)
RDW: 13.7 % (ref 11.5–15.5)
WBC: 6.3 K/uL (ref 4.0–10.5)
nRBC: 0 % (ref 0.0–0.2)

## 2023-12-09 LAB — BASIC METABOLIC PANEL WITH GFR
Anion gap: 3 — ABNORMAL LOW (ref 5–15)
BUN: 12 mg/dL (ref 8–23)
CO2: 22 mmol/L (ref 22–32)
Calcium: 7.8 mg/dL — ABNORMAL LOW (ref 8.9–10.3)
Chloride: 116 mmol/L — ABNORMAL HIGH (ref 98–111)
Creatinine, Ser: 0.85 mg/dL (ref 0.44–1.00)
GFR, Estimated: >60 mL/min (ref >60)
Glucose, Bld: 139 mg/dL — ABNORMAL HIGH (ref 70–99)
Potassium: 4.1 mmol/L (ref 3.5–5.1)
Sodium: 141 mmol/L (ref 135–145)

## 2023-12-09 LAB — LEGIONELLA PNEUMOPHILA SEROGP 1 UR AG: L. pneumophila Serogp 1 Ur Ag: NEGATIVE

## 2023-12-09 LAB — PROTIME-INR
INR: 2.3 — ABNORMAL HIGH (ref 0.8–1.2)
Prothrombin Time: 26.8 s — ABNORMAL HIGH (ref 11.4–15.2)

## 2023-12-09 LAB — GLUCOSE, CAPILLARY
Glucose-Capillary: 162 mg/dL — ABNORMAL HIGH (ref 70–99)
Glucose-Capillary: 152 mg/dL — ABNORMAL HIGH (ref 70–99)

## 2023-12-09 MED ORDER — GLUCERNA SHAKE PO LIQD: 237.0000 mL | Freq: Three times a day (TID) | ORAL | Status: DC

## 2023-12-09 MED ORDER — LEVOFLOXACIN 750 MG PO TABS
750.0000 mg | ORAL_TABLET | Freq: Every day | ORAL | 0 refills | Status: DC
  Filled 2023-12-09: qty 3, 3d supply, fill #0

## 2023-12-09 MED ORDER — AZITHROMYCIN 250 MG PO TABS: 500.0000 mg | ORAL_TABLET | Freq: Every day | ORAL | Status: DC

## 2023-12-09 NOTE — Progress Notes (Signed)
 PHARMACY - ANTICOAGULATION CONSULT NOTE  Pharmacy Consult for Warfarin  Indication: DVT  Patient Measurements: Height: 5' 3 (160 cm) Weight: 84.8 kg (187 lb) IBW/kg (Calculated) : 52.4 HEPARIN  DW (KG): 71.3  Labs: Recent Labs    12/07/23 1858 12/08/23 0527 12/09/23 0513  HGB 13.2  --  10.5*  HCT 40.7  --  32.7*  PLT 83*  --  64*  LABPROT  --  26.4* 26.8*  INR  --  2.3* 2.3*  CREATININE 1.13*  --  0.85  TROPONINIHS 15  --   --     Estimated Creatinine Clearance (by C-G formula based on SCr of 0.85 mg/dL) Female: 38.1 mL/min Female: 75.7 mL/min   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, 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   CHF (congestive heart failure) (HCC)    followed colon resection, wouldn't let me get up   Chronic anticoagulation    Degenerative disc disease, lumbar    Diverticulitis 2018   Diverticulosis    DVT of lower extremity, bilateral (HCC) 2018   Essential hypertension    History of being hospitalized    1 month ago for 3 days, vomiting, and kink in upper intestine   History of hiatal hernia    Mediastinal adenopathy 04/2023   MRSA nasal colonization    Non-small cell lung cancer (HCC) 2022   right   Obesity (BMI 30-39.9)    Osteoarthritis    Osteoporosis    PE (pulmonary thromboembolism) (HCC) 2018   Pneumonia    Small bowel obstruction (HCC) 03/20/2023   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HCC)    Type 2 diabetes mellitus with obesity (HCC)    Vertigo    Assessment: Pharmacy consulted to dose warfarin for DVT in this 73 year old female admitted with PNA, sepsis.  Pt was on warfarin 6 mg PO QAM PTA, last dose on 7/16 AM.   7/17: INR = 2.3, therapeutic 7/18: INR = 2.3, therapeutic  Goal of Therapy:  INR 2-3   Plan:  --Warfarin is ordered 6 mg qAM (PTA regimen) --Continue as ordered  Treanna Dumler B Marketia Stallsmith 12/09/2023,8:55 AM

## 2023-12-09 NOTE — Discharge Summary (Signed)
 Unnamed Kara [name]:[id number] DOB: January 16, 1951 DOA: 12/07/2023  PCP: Sadie Manna, MD  Admit date: 12/07/2023 Discharge date: 12/09/2023  Time spent: 35 minutes  Recommendations for Outpatient Follow-up:  Pcp f/u 1-2 weeks     Discharge Diagnoses:  Principal Problem:   Sepsis due to pneumonia [facility]) Active Problems:   Recurrent non-small cell lung cancer (NSCLC) (HCC)   Acute diarrhea   Abnormal urinalysis   AKI (acute kidney injury) (HCC)   History of pulmonary embolism   Contusion of elbow and forearm, left, initial encounter   Chronic anticoagulation (COUMADIN )   Long term current use of opiate analgesic   Benign essential hypertension   Ileostomy in place (HCC)   S/P IVC filter   Chronic pain syndrome   Oxygen dependent   Cancer of upper lobe of right lung (HCC)   Chemotherapy induced neutropenia (HCC)   Diabetes mellitus without complication (HCC)   Sepsis, unspecified organism [facility])   Discharge Condition: stable  Diet recommendation: heart healthy  Filed Weights   12/08/23 1252  Weight: 84.8 kg    History of present illness:  From admission h and p Kara [name] is a 73 y.o. 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 She initially presented with weakness resulting in a fall in which she hit her right side sustained bruising to the left forearm.  She complains of shaking chills without fevers.  She is concerned about a UTI as she is incontinent and wears diapers.  Endorses a mild cough along with shortness of breath which she states is her baseline.  Upon arrival in the emergency room she had a few soft, watery bowel movements.  Denies abdominal pain or  vomiting. she was reportedly hypotensive with EMS. In the ED, she was tachycardic to 128 and while initially hypertensive her BP went as low as 91/55 and was fluid resuscitated 105/83.  She was tachypneic to the high 20s to 30s.  Not hypoxic. Labs notable for leukopenia of 2.1, down from 3.8 a week prior.  Lactic acid 1.8.  CMP notable for creatinine of 1.13 up from baseline of 0.64 couple months prior but otherwise unremarkable.  Troponin 15, lipase and LFTs WNL.  Urinalysis with many bacteria and small leukocyte esterase. GI panel and C. difficile ordered from the ED, still pending EKG showing sinus tachycardia at 128.  CT head and C-spine nonacute CT abdomen and pelvis showing new peripheral airspace opacities consistent with acute multifocal infiltrate CTA PE protocol negative for PE    Hospital Course:   History htn, lung cancer, t2dm, pe/dvt on coumadin , gi bleeds, chronic pain, presenting with acute onset shaking chills. Also an episode of diarrhea here. Also mild cough. Denies dysuria/frequency/urgency. Reports feeling weak but overall much better. Normal vitals. Mildly elevated lactate has normalized, labs otherwise stable. Admitted for pneumonia/uti. Ua obtained after abx and no culture ordered, initial culture growing 50k gnrs, patient denies specific uti symptoms but will opt to treat this pneumonia with levofloxacin which should cover this urine organism as well. CTA shows possible pneumonia and with patient's cough reasonable to treat for that, treated here with ceftriaxone /azithromycin  and will transition to levofloxacin to complete a 5 day course. No o2 requirement, ambulated well with PT, no home health advised. Respiratory panels  and urine antigens negative, blood cultures no growth to date. Had no further diarrhea so no stool testing was performed. Patient reports feeling back to her baseline and ready to discharge home. Advise pcp f/u 1 week. D/c reviewed with patient's husband who  is in agreement.   Procedures: none   Consultations: none  Discharge Exam: Vitals:   12/09/23 0751 12/09/23 1148  BP: (!) 144/71 (!) 113/57  Pulse: (!) 115 97  Resp: 18   Temp: 98.6 F (37 C) 98 F (36.7 C)  SpO2: 94% 94%    General: NAD Cardiovascular: RRR Respiratory: Few scattered rales, normal wob  Discharge Instructions   Discharge Instructions     Diet - low sodium heart healthy   Complete by: As directed    Increase activity slowly   Complete by: As directed       Allergies as of 12/09/2023       Reactions   Adhesive [tape] Other (See Comments)   Pt reports, when removing tape from skin most tapes pull her skin off. Ok to use paper tape   Avapro  [irbesartan ] Anaphylaxis   Facial swelling   Morphine  And Codeine  Nausea And Vomiting   Other Hives   Chlorhexadine wipes/CHG wipes,    Latex Rash   Exam gloves/ does not react around elastic and lips don't swell when blowing balloons        Medication List     TAKE these medications    bisacodyl  10 MG suppository Commonly known as: DULCOLAX Place 1 suppository (10 mg total) rectally as needed for moderate constipation.   candesartan 16 MG tablet Commonly known as: ATACAND Take 16 mg by mouth in the morning.   cholecalciferol  25 MCG (1000 UNIT) tablet Commonly known as: VITAMIN D3 Take 1,000 Units by mouth in the morning.   cyclobenzaprine  10 MG tablet Commonly known as: FLEXERIL  Take 10 mg by mouth at bedtime.   gabapentin  100 MG capsule Commonly known as: NEURONTIN  Take 200 mg by mouth with breakfast, with lunch, and with evening meal.   gabapentin  300 MG capsule Commonly known as: NEURONTIN  Take 300 mg by mouth at bedtime.   glipiZIDE  10 MG tablet Commonly known as: GLUCOTROL  Take 10 mg by mouth daily before breakfast.   HYDROcodone -acetaminophen  5-325 MG tablet Commonly known as: NORCO/VICODIN Take 1 tablet by mouth every 6 (six) hours as needed for moderate pain.    levofloxacin 750 MG tablet Commonly known as: Levaquin Take 1 tablet (750 mg total) by mouth daily for 3 days. Start on 12/10/2023   Magnesium Oxide -Mg Supplement 500 MG Tabs Take 1 tablet by mouth at bedtime.   omeprazole  20 MG capsule Commonly known as: PRILOSEC Take 1 capsule (20 mg total) by mouth daily.   ondansetron  8 MG tablet Commonly known as: ZOFRAN  TAKE 1 TABLET BY MOUTH EVERY 8 HOURS AS NEEDED FOR NAUSEA OR VOMITING. START ON THE 3RD DAY AFTER CHEMOTHERAPY   OneTouch Ultra Test test strip Generic drug: glucose blood   osimertinib  mesylate 80 MG tablet Commonly known as: Tagrisso  Take 1 tablet (80 mg total) by mouth daily.   oxybutynin  5 MG tablet Commonly known as: DITROPAN  Take 1 tablet by mouth 2 (two) times daily.   potassium chloride  SA 20 MEQ tablet Commonly known as: KLOR-CON  M Take 1 tablet (20 mEq total) by mouth daily.   prochlorperazine  10 MG tablet Commonly known as: COMPAZINE  Take 1 tablet (10 mg total) by mouth every 6 (six) hours as needed for nausea  or vomiting.   rOPINIRole  1 MG tablet Commonly known as: REQUIP  Take 1 mg by mouth at bedtime.   sucralfate  1 g tablet Commonly known as: Carafate  Take 1 tablet (1 g total) by mouth 3 (three) times daily with meals. Dissolve tablet in 4 tablesppons warm water swish and swallow 30 min before meals. What changed: additional instructions   Trulicity 1.5 MG/0.5ML Soaj Generic drug: Dulaglutide Inject 1.5 mg into the skin once a week. Saturday   Vitamin B-12 5000 MCG Tbdp Take 5,000 mcg by mouth in the morning.   warfarin 5 MG tablet Commonly known as: COUMADIN  Take 5 mg by mouth daily.       Allergies  Allergen Reactions   Adhesive [Tape] Other (See Comments)    Pt reports, when removing tape from skin most tapes pull her skin off. Ok to use paper tape   Avapro  [Irbesartan ] Anaphylaxis    Facial swelling   Morphine  And Codeine  Nausea And Vomiting   Other Hives    Chlorhexadine  wipes/CHG wipes,    Latex Rash    Exam gloves/ does not react around elastic and lips don't swell when blowing balloons    Follow-up Information     Sadie Manna, MD Follow up.   Specialty: Internal Medicine Why: 1-2 weeks Contact information: [address] [place] [address] [phone]                  The results of significant diagnostics from this hospitalization (including imaging, microbiology, ancillary and laboratory) are listed below for reference.    Significant Diagnostic Studies: CT Angio Chest PE W and/or Wo Contrast Result Date: 12/08/2023 CLINICAL DATA:  Acute chest and abdominal pain, history of recent fall, initial encounter EXAM: CT ANGIOGRAPHY CHEST CT ABDOMEN AND PELVIS WITH CONTRAST TECHNIQUE: Multidetector CT imaging of the chest was performed using the standard protocol during bolus administration of intravenous contrast. Multiplanar CT image reconstructions and MIPs were obtained to evaluate the vascular anatomy. Multidetector CT imaging of the abdomen and pelvis was performed using the standard protocol during bolus administration of intravenous contrast. RADIATION DOSE REDUCTION: This exam was performed according to the departmental dose-optimization program which includes automated exposure control, adjustment of the mA and/or kV according to patient size and/or use of iterative reconstruction technique. CONTRAST:  OMNIPAQUE  IOHEXOL  350 MG/ML SOLN COMPARISON:  10/06/2023 FINDINGS: CTA CHEST FINDINGS Cardiovascular: Atherosclerotic calcifications of the thoracic aorta are noted. No aneurysmal dilatation or dissection is seen. No cardiac enlargement is noted. The pulmonary artery shows a normal branching pattern bilaterally. No filling defect to suggest pulmonary embolism is noted. Mediastinum/Nodes: Thoracic inlet is within normal limits. Small mediastinal nodes are seen near the AP window and along the left pulmonary  artery. These were not well visualized on the prior exam. These may be reactive in nature as they are not significant by size criteria. Previously seen right paratracheal nodes are less prominent on today's exam. The esophagus as visualized is within normal limits. Lungs/Pleura: The left lung is well aerated without focal infiltrate or sizable effusion. Right lung again demonstrates right hilar and upper lobe density similar to that seen on the prior exam consistent with post treatment changes. Some patchy opacities are noted in the right upper lobe as well as in the medial aspect of the right middle lobe and right lower lobe new from the prior exam felt to represent acute infiltrate. Minimal right pleural effusion is noted. No parenchymal nodule is seen.  Musculoskeletal: Degenerative changes of the thoracic spine are seen. No compression deformity is seen. Review of the MIP images confirms the above findings. CT ABDOMEN and PELVIS FINDINGS Hepatobiliary: Liver is within normal limits. Gallbladder is well distended with multiple dependent gallstones stable from the prior exam. Pancreas: Unremarkable. No pancreatic ductal dilatation or surrounding inflammatory changes. Spleen: Normal in size without focal abnormality. Adrenals/Urinary Tract: Adrenal glands are within normal limits. Kidneys demonstrate a normal enhancement pattern bilaterally. No obstructive changes are seen. The bladder is well distended. Stomach/Bowel: No obstructive or inflammatory changes of the colon are noted. A loop of transverse colon extends into a large ventral hernia. This is stable in appearance from the prior exam. No obstructive changes are seen. The appendix is not visualized consistent with a prior surgical history. Small bowel and stomach are unremarkable. Vascular/Lymphatic: Aortic atherosclerosis. No enlarged abdominal or pelvic lymph nodes. Reproductive: Uterus and bilateral adnexa are unremarkable. Other: Ventral hernia is again  seen and stable with a loop of transverse colon within. No free fluid is noted. No abdominopelvic ascites. Musculoskeletal: Degenerative changes of lumbar spine are seen. Review of the MIP images confirms the above findings. IMPRESSION: CTA of the chest: No evidence of pulmonary emboli. Post treatment changes in the right hilum and extending into the right upper lobe similar to that seen on the prior exam. There are some new peripheral airspace opacities identified consistent with acute multifocal infiltrate. CT of the abdomen and pelvis: Cholelithiasis without complicating factors. Large ventral hernia containing transverse colon without obstructive change. Electronically Signed   By: Oneil Devonshire M.D.   On: 12/08/2023 01:48   CT ABDOMEN PELVIS W CONTRAST Result Date: 12/08/2023 CLINICAL DATA:  Acute chest and abdominal pain, history of recent fall, initial encounter EXAM: CT ANGIOGRAPHY CHEST CT ABDOMEN AND PELVIS WITH CONTRAST TECHNIQUE: Multidetector CT imaging of the chest was performed using the standard protocol during bolus administration of intravenous contrast. Multiplanar CT image reconstructions and MIPs were obtained to evaluate the vascular anatomy. Multidetector CT imaging of the abdomen and pelvis was performed using the standard protocol during bolus administration of intravenous contrast. RADIATION DOSE REDUCTION: This exam was performed according to the departmental dose-optimization program which includes automated exposure control, adjustment of the mA and/or kV according to patient size and/or use of iterative reconstruction technique. CONTRAST:  OMNIPAQUE  IOHEXOL  350 MG/ML SOLN COMPARISON:  10/06/2023 FINDINGS: CTA CHEST FINDINGS Cardiovascular: Atherosclerotic calcifications of the thoracic aorta are noted. No aneurysmal dilatation or dissection is seen. No cardiac enlargement is noted. The pulmonary artery shows a normal branching pattern bilaterally. No filling defect to suggest  pulmonary embolism is noted. Mediastinum/Nodes: Thoracic inlet is within normal limits. Small mediastinal nodes are seen near the AP window and along the left pulmonary artery. These were not well visualized on the prior exam. These may be reactive in nature as they are not significant by size criteria. Previously seen right paratracheal nodes are less prominent on today's exam. The esophagus as visualized is within normal limits. Lungs/Pleura: The left lung is well aerated without focal infiltrate or sizable effusion. Right lung again demonstrates right hilar and upper lobe density similar to that seen on the prior exam consistent with post treatment changes. Some patchy opacities are noted in the right upper lobe as well as in the medial aspect of the right middle lobe and right lower lobe new from the prior exam felt to represent acute infiltrate. Minimal right pleural effusion is noted. No parenchymal nodule is seen.  Musculoskeletal: Degenerative changes of the thoracic spine are seen. No compression deformity is seen. Review of the MIP images confirms the above findings. CT ABDOMEN and PELVIS FINDINGS Hepatobiliary: Liver is within normal limits. Gallbladder is well distended with multiple dependent gallstones stable from the prior exam. Pancreas: Unremarkable. No pancreatic ductal dilatation or surrounding inflammatory changes. Spleen: Normal in size without focal abnormality. Adrenals/Urinary Tract: Adrenal glands are within normal limits. Kidneys demonstrate a normal enhancement pattern bilaterally. No obstructive changes are seen. The bladder is well distended. Stomach/Bowel: No obstructive or inflammatory changes of the colon are noted. A loop of transverse colon extends into a large ventral hernia. This is stable in appearance from the prior exam. No obstructive changes are seen. The appendix is not visualized consistent with a prior surgical history. Small bowel and stomach are unremarkable.  Vascular/Lymphatic: Aortic atherosclerosis. No enlarged abdominal or pelvic lymph nodes. Reproductive: Uterus and bilateral adnexa are unremarkable. Other: Ventral hernia is again seen and stable with a loop of transverse colon within. No free fluid is noted. No abdominopelvic ascites. Musculoskeletal: Degenerative changes of lumbar spine are seen. Review of the MIP images confirms the above findings. IMPRESSION: CTA of the chest: No evidence of pulmonary emboli. Post treatment changes in the right hilum and extending into the right upper lobe similar to that seen on the prior exam. There are some new peripheral airspace opacities identified consistent with acute multifocal infiltrate. CT of the abdomen and pelvis: Cholelithiasis without complicating factors. Large ventral hernia containing transverse colon without obstructive change. Electronically Signed   By: Oneil Devonshire M.D.   On: 12/08/2023 01:48   CT HEAD WO CONTRAST ( ) Result Date: 12/08/2023 CLINICAL DATA:  Recent fall with headaches and neck pain, initial encounter EXAM: CT HEAD WITHOUT CONTRAST CT CERVICAL SPINE WITHOUT CONTRAST TECHNIQUE: Multidetector CT imaging of the head and cervical spine was performed following the standard protocol without intravenous contrast. Multiplanar CT image reconstructions of the cervical spine were also generated. RADIATION DOSE REDUCTION: This exam was performed according to the departmental dose-optimization program which includes automated exposure control, adjustment of the mA and/or kV according to patient size and/or use of iterative reconstruction technique. COMPARISON:  10/03/2023 FINDINGS: CT HEAD FINDINGS Brain: No evidence of acute infarction, hemorrhage, hydrocephalus, extra-axial collection or mass lesion/mass effect. Vascular: No hyperdense vessel or unexpected calcification. Skull: Normal. Negative for fracture or focal lesion. Sinuses/Orbits: No acute finding. Other: None. CT CERVICAL SPINE FINDINGS  Alignment: Within normal limits. Skull base and vertebrae: 7 cervical segments are well visualized. Vertebral body height is well maintained. Mild facet hypertrophic changes are seen. Minimal osteophytic changes are seen. No acute fracture or acute facet abnormality is noted. The odontoid is within normal limits. Soft tissues and spinal canal: Surrounding soft tissue structures are within normal limits. Upper chest: Visualized lung apices are unremarkable. Other: None IMPRESSION: CT of the head: No acute intracranial abnormality noted. CT of the cervical spine: Mild degenerative change without acute abnormality. Electronically Signed   By: Oneil Devonshire M.D.   On: 12/08/2023 01:38   CT Cervical Spine Wo Contrast Result Date: 12/08/2023 CLINICAL DATA:  Recent fall with headaches and neck pain, initial encounter EXAM: CT HEAD WITHOUT CONTRAST CT CERVICAL SPINE WITHOUT CONTRAST TECHNIQUE: Multidetector CT imaging of the head and cervical spine was performed following the standard protocol without intravenous contrast. Multiplanar CT image reconstructions of the cervical spine were also generated. RADIATION DOSE REDUCTION: This exam was performed according to the departmental dose-optimization program which  includes automated exposure control, adjustment of the mA and/or kV according to patient size and/or use of iterative reconstruction technique. COMPARISON:  10/03/2023 FINDINGS: CT HEAD FINDINGS Brain: No evidence of acute infarction, hemorrhage, hydrocephalus, extra-axial collection or mass lesion/mass effect. Vascular: No hyperdense vessel or unexpected calcification. Skull: Normal. Negative for fracture or focal lesion. Sinuses/Orbits: No acute finding. Other: None. CT CERVICAL SPINE FINDINGS Alignment: Within normal limits. Skull base and vertebrae: 7 cervical segments are well visualized. Vertebral body height is well maintained. Mild facet hypertrophic changes are seen. Minimal osteophytic changes are seen.  No acute fracture or acute facet abnormality is noted. The odontoid is within normal limits. Soft tissues and spinal canal: Surrounding soft tissue structures are within normal limits. Upper chest: Visualized lung apices are unremarkable. Other: None IMPRESSION: CT of the head: No acute intracranial abnormality noted. CT of the cervical spine: Mild degenerative change without acute abnormality. Electronically Signed   By: Oneil Devonshire M.D.   On: 12/08/2023 01:38   ECHOCARDIOGRAM COMPLETE Result Date: 11/17/2023    ECHOCARDIOGRAM REPORT   Patient Name:   Kara [name] Date of Exam: 11/17/2023 Medical Rec #:  [id number]             Height:       63.0 in Accession #:    [id number]            Weight:       187.0 lb Date of Birth:  25-Jun-1950             BSA:          1.879 m Patient Age:    73 years              BP:           109/68 mmHg Patient Gender: F                     HR:           85 bpm. Exam Location:  [name] Procedure: 2D Echo, Cardiac Doppler, Color Doppler, 3D Echo and Strain Analysis            (Both Spectral and Color Flow Doppler were utilized during            procedure). Indications:     Cancer of upper lobe of right lung C34.11                  Malignant neoplasm of upper lobe of right lung.  History:         Patient has prior history of Echocardiogram examinations, most                  recent 11/19/2016. CHF; Risk Factors:Diabetes. Pulmonary                  embolism.  Sonographer:     Christopher Furnace Referring Phys:  [id number] [facility] C RAO Diagnosing Phys: Cara JONETTA Lovelace MD  Sonographer Comments: Global longitudinal strain was attempted. IMPRESSIONS  1. Left ventricular ejection fraction, by estimation, is 55 to 60%. The left ventricle has normal function. The left ventricle has no regional wall motion abnormalities. Left ventricular diastolic parameters are consistent with Grade I diastolic dysfunction (impaired relaxation). The average left ventricular global longitudinal strain is 8.6 %. The  global longitudinal strain is abnormal.  2. Right ventricular systolic function is normal. The right ventricular size is normal.  3. The mitral valve is normal in structure.  No evidence of mitral valve regurgitation.  4. The aortic valve is grossly normal. Aortic valve regurgitation is not visualized. Aortic valve sclerosis/calcification is present, without any evidence of aortic stenosis. FINDINGS  Left Ventricle: Left ventricular ejection fraction, by estimation, is 55 to 60%. The left ventricle has normal function. The left ventricle has no regional wall motion abnormalities. The average left ventricular global longitudinal strain is 8.6 %. Strain was performed and the global longitudinal strain is abnormal. The left ventricular internal cavity size was normal in size. There is no left ventricular hypertrophy. Left ventricular diastolic parameters are consistent with Grade I diastolic dysfunction (impaired relaxation). Right Ventricle: The right ventricular size is normal. No increase in right ventricular wall thickness. Right ventricular systolic function is normal. Left Atrium: Left atrial size was normal in size. Right Atrium: Right atrial size was normal in size. Pericardium: There is no evidence of pericardial effusion. Mitral Valve: The mitral valve is normal in structure. There is mild thickening of the mitral valve leaflet(s). There is mild calcification of the mitral valve leaflet(s). Normal mobility of the mitral valve leaflets. No evidence of mitral valve regurgitation. MV peak gradient, 9.7 mmHg. The mean mitral valve gradient is 4.0 mmHg. Tricuspid Valve: The tricuspid valve is normal in structure. Tricuspid valve regurgitation is mild. Aortic Valve: The aortic valve is grossly normal. Aortic valve regurgitation is not visualized. Aortic valve sclerosis/calcification is present, without any evidence of aortic stenosis. Aortic valve mean gradient measures 3.0 mmHg. Aortic valve peak gradient measures  5.8 mmHg. Aortic valve area, by VTI measures 2.57 cm. Pulmonic Valve: The pulmonic valve was normal in structure. Pulmonic valve regurgitation is not visualized. Aorta: The ascending aorta was not well visualized. IAS/Shunts: No atrial level shunt detected by color flow Doppler. Additional Comments: 3D was performed not requiring image post processing on an independent workstation and was indeterminate.  LEFT VENTRICLE PLAX 2D LVIDd:         3.70 cm   Diastology LVIDs:         2.60 cm   LV e' medial:    5.22 cm/s LV PW:         0.90 cm   LV E/e' medial:  17.8 LV IVS:        0.90 cm   LV e' lateral:   6.74 cm/s LVOT diam:     2.10 cm   LV E/e' lateral: 13.8 LV SV:         56 LV SV Index:   30        2D Longitudinal Strain LVOT Area:     3.46 cm  2D Strain GLS (A4C):   14.1 %                          2D Strain GLS (A3C):   5.9 %                          2D Strain GLS (A2C):   5.8 %                          2D Strain GLS Avg:     8.6 % RIGHT VENTRICLE RV Basal diam:  2.80 cm RV Mid diam:    2.30 cm LEFT ATRIUM             Index        RIGHT ATRIUM  Index LA diam:        2.30 cm 1.22 cm/m   RA Area:     7.61 cm LA Vol (A2C):   15.0 ml 7.98 ml/m   RA Volume:   10.50 ml 5.59 ml/m LA Vol (A4C):   21.9 ml 11.65 ml/m LA Biplane Vol: 18.2 ml 9.69 ml/m  AORTIC VALVE AV Area (Vmax):    2.37 cm AV Area (Vmean):   2.38 cm AV Area (VTI):     2.57 cm AV Vmax:           120.00 cm/s AV Vmean:          84.700 cm/s AV VTI:            0.217 m AV Peak Grad:      5.8 mmHg AV Mean Grad:      3.0 mmHg LVOT Vmax:         82.20 cm/s LVOT Vmean:        58.100 cm/s LVOT VTI:          0.161 m LVOT/AV VTI ratio: 0.74  AORTA Ao Root diam: 2.70 cm MITRAL VALVE                TRICUSPID VALVE MV Area (PHT): 4.46 cm     TR Peak grad:   16.6 mmHg MV Area VTI:   2.22 cm     TR Vmax:        204.00 cm/s MV Peak grad:  9.7 mmHg MV Mean grad:  4.0 mmHg     SHUNTS MV Vmax:       1.56 m/s     Systemic VTI:  0.16 m MV Vmean:      91.1 cm/s     Systemic Diam: 2.10 cm MV Decel Time: 170 msec MV E velocity: 92.80 cm/s MV A velocity: 140.00 cm/s MV E/A ratio:  0.66 Dwayne D Callwood MD Electronically signed by Cara JONETTA Lovelace MD Signature Date/Time: 11/17/2023/2:22:07 PM    Final    IR Removal Tun Access W/ Port W/O FL Result Date: 11/16/2023 INDICATION: 73 year old with history of lung cancer. Port-A-Cath is no longer needed. EXAM: REMOVAL OF RIGHT CHEST PORT-A-CATH MEDICATIONS: 1% lidocaine  with epinephrine  ANESTHESIA/SEDATION: No sedation FLUOROSCOPY: None COMPLICATIONS: None immediate. PROCEDURE: Informed written consent was obtained from the patient after a thorough discussion of the procedural risks, benefits and alternatives. All questions were addressed. Maximal Sterile Barrier Technique was utilized including caps, mask, sterile gowns, sterile gloves, sterile drape, hand hygiene and skin antiseptic. A timeout was performed prior to the initiation of the procedure. The right chest was prepped and draped in a sterile fashion. 1% lidocaine  with epinephrine  was utilized for local anesthesia. An incision was made over the previously healed surgical incision. Utilizing blunt dissection, the port catheter and reservoir were removed from the underlying subcutaneous tissue in their entirety. The pocket was irrigated with a copious amount of sterile normal saline. The subcutaneous tissue was closed with 3-0 Vicryl interrupted subcutaneous stitches. A 4-0 Vicryl running subcuticular stitch was utilized to approximate the skin. Dermabond was applied. IMPRESSION: Successful removal of right chest Port-A-Cath. Electronically Signed   By: Juliene Balder M.D.   On: 11/16/2023 14:56    Microbiology: Recent Results (from the past 240 hours)  Blood culture (routine x 2)     Status: None (Preliminary result)   Collection Time: 12/07/23 10:41 PM   Specimen: BLOOD  Result Value Ref Range Status   Specimen Description BLOOD BLOOD RIGHT  ARM  Final   Special  Requests   Final    BOTTLES DRAWN AEROBIC AND ANAEROBIC Blood Culture adequate volume   Culture   Final    NO GROWTH 2 DAYS Performed at [facility], [address] [place], [place] [address]    Report Status PENDING  Incomplete  Blood culture (routine x 2)     Status: None (Preliminary result)   Collection Time: 12/07/23 10:41 PM   Specimen: BLOOD  Result Value Ref Range Status   Specimen Description BLOOD BLOOD LEFT FOREARM  Final   Special Requests   Final    BOTTLES DRAWN AEROBIC AND ANAEROBIC Blood Culture results may not be optimal due to an excessive volume of blood received in culture bottles   Culture   Final    NO GROWTH 2 DAYS Performed at [facility], [address], [place], [place] [address]    Report Status PENDING  Incomplete  Urine Culture (for pregnant, neutropenic or urologic patients or patients with an indwelling urinary catheter)     Status: Abnormal (Preliminary result)   Collection Time: 12/08/23  1:32 AM   Specimen: Urine, Clean Catch  Result Value Ref Range Status   Specimen Description   Final    URINE, CLEAN CATCH Performed at [facility], [address] [place], [place] [address]    Special Requests   Final    NONE Performed at [facility], [address], [place], [place] [address]    Culture (A)  Final    50,000 COLONIES/mL GRAM NEGATIVE RODS SUSCEPTIBILITIES TO FOLLOW Performed at [facility], [address], [place], [place] [address]    Report Status PENDING  Incomplete  SARS Coronavirus 2 by RT PCR (hospital order, performed in [facility] hospital lab) *cepheid single result test* Anterior Nasal Swab     Status: None   Collection Time: 12/08/23 10:26 AM   Specimen: Anterior Nasal Swab  Result Value Ref Range Status   SARS Coronavirus 2 by RT PCR NEGATIVE NEGATIVE Final    Comment: (NOTE) SARS-CoV-2 target nucleic acids are NOT DETECTED.  The SARS-CoV-2 RNA is generally detectable in  upper and lower respiratory specimens during the acute phase of infection. The lowest concentration of SARS-CoV-2 viral copies this assay can detect is 250 copies / mL. A negative result does not preclude SARS-CoV-2 infection and should not be used as the sole basis for treatment or other patient management decisions.  A negative result may occur with improper specimen collection / handling, submission of specimen other than nasopharyngeal swab, presence of viral mutation(s) within the areas targeted by this assay, and inadequate number of viral copies (<250 copies / mL). A negative result must be combined with clinical observations, patient history, and epidemiological information.  Fact Sheet for Patients:   RoadLapTop.co.za  Fact Sheet for Healthcare Providers: http://kim-miller.com/  This test is not yet approved or  cleared by the [place]  FDA and has been authorized for detection and/or diagnosis of SARS-CoV-2 by FDA under an Emergency Use Authorization (EUA).  This EUA will remain in effect (meaning this test can be used) for the duration of the COVID-19 declaration under Section 564(b)(1) of the Act, 21 U.S.C. section 360bbb-3(b)(1), unless the authorization is terminated or revoked sooner.  Performed at [facility], [address] [place], [place] [address]   Respiratory (~20 pathogens) panel by PCR     Status: None   Collection Time: 12/08/23 11:23 AM  Specimen: Nasopharyngeal Swab; Respiratory  Result Value Ref Range Status   Adenovirus NOT DETECTED NOT DETECTED Final   Coronavirus 229E NOT DETECTED NOT DETECTED Final    Comment: (NOTE) The Coronavirus on the Respiratory Panel, DOES NOT test for the novel  Coronavirus (2019 nCoV)    Coronavirus HKU1 NOT DETECTED NOT DETECTED Final   Coronavirus NL63 NOT DETECTED NOT DETECTED Final   Coronavirus OC43 NOT DETECTED NOT DETECTED Final   Metapneumovirus NOT  DETECTED NOT DETECTED Final   Rhinovirus / Enterovirus NOT DETECTED NOT DETECTED Final   Influenza A NOT DETECTED NOT DETECTED Final   Influenza B NOT DETECTED NOT DETECTED Final   Parainfluenza Virus 1 NOT DETECTED NOT DETECTED Final   Parainfluenza Virus 2 NOT DETECTED NOT DETECTED Final   Parainfluenza Virus 3 NOT DETECTED NOT DETECTED Final   Parainfluenza Virus 4 NOT DETECTED NOT DETECTED Final   Respiratory Syncytial Virus NOT DETECTED NOT DETECTED Final   Bordetella pertussis NOT DETECTED NOT DETECTED Final   Bordetella Parapertussis NOT DETECTED NOT DETECTED Final   Chlamydophila pneumoniae NOT DETECTED NOT DETECTED Final   Mycoplasma pneumoniae NOT DETECTED NOT DETECTED Final    Comment: Performed at [facility], [address], [place], [place] [address]     Labs: Basic Metabolic Panel: Recent Labs  Lab 12/07/23 1858 12/09/23 0513  NA 140 141  K 4.0 4.1  CL 108 116*  CO2 21* 22  GLUCOSE 182* 139*  BUN 13 12  CREATININE 1.13* 0.85  CALCIUM  9.2 7.8*   Liver Function Tests: Recent Labs  Lab 12/07/23 1858  AST 31  ALT 18  ALKPHOS 61  BILITOT 0.9  PROT 7.7  ALBUMIN 3.4*   Recent Labs  Lab 12/07/23 1858  LIPASE 26   No results for input(s): AMMONIA in the last 168 hours. CBC: Recent Labs  Lab 12/07/23 1858 12/09/23 0513  WBC 2.1* 6.3  HGB 13.2 10.5*  HCT 40.7 32.7*  MCV 93.6 95.1  PLT 83* 64*   Cardiac Enzymes: No results for input(s): CKTOTAL, CKMB, CKMBINDEX, TROPONINI in the last 168 hours. BNP: BNP (last 3 results) Recent Labs    05/09/23 1252  BNP 19.5    ProBNP (last 3 results) No results for input(s): PROBNP in the last 8760 hours.  CBG: Recent Labs  Lab 12/08/23 1215 12/08/23 1656 12/08/23 2018 12/09/23 0750 12/09/23 1146  GLUCAP 192* 106* 117* 162* 152*       Signed:  Devaughn KATHEE Ban MD.  [facility] 12/09/2023, 12:08 PM

## 2023-12-09 NOTE — Progress Notes (Signed)
 Transition of Care [facility]) - Inpatient Brief Assessment   Patient Details  Name: Kara [name] MRN: [id number] Date of Birth: 01-25-1951  Transition of Care [facility]) CM/SW Contact:    Waneda Klammer C Nathyn Luiz, RN Phone Number: 12/09/2023, 1:56 PM   Clinical Narrative: TOC continuing to follow patient's progress throughout discharge planning.   Transition of Care Asessment: Insurance and Status: Insurance coverage has been reviewed Patient has primary care physician: Yes   Prior level of function:: Independent Prior/Current Home Services: No current home services Social Drivers of Health Review: SDOH reviewed no interventions necessary Readmission risk has been reviewed: Yes Transition of care needs: no transition of care needs at this time

## 2023-12-10 LAB — URINE CULTURE: Culture: 50000 — AB

## 2023-12-11 ENCOUNTER — Inpatient Hospital Stay

## 2023-12-11 ENCOUNTER — Emergency Department

## 2023-12-11 ENCOUNTER — Inpatient Hospital Stay
Admission: EM | Admit: 2023-12-11 | Discharge: 2023-12-20 | DRG: 189 | Disposition: A | Discharge status: Home Health | Attending: Internal Medicine | Admitting: Internal Medicine

## 2023-12-11 DIAGNOSIS — D6959 Other secondary thrombocytopenia: Secondary | ICD-10-CM | POA: Diagnosis present

## 2023-12-11 DIAGNOSIS — I82431 Acute embolism and thrombosis of right popliteal vein: Secondary | ICD-10-CM | POA: Diagnosis not present

## 2023-12-11 DIAGNOSIS — Z9049 Acquired absence of other specified parts of digestive tract: Secondary | ICD-10-CM

## 2023-12-11 DIAGNOSIS — I824Z1 Acute embolism and thrombosis of unspecified deep veins of right distal lower extremity: Secondary | ICD-10-CM | POA: Diagnosis present

## 2023-12-11 DIAGNOSIS — D61810 Antineoplastic chemotherapy induced pancytopenia: Secondary | ICD-10-CM | POA: Diagnosis not present

## 2023-12-11 DIAGNOSIS — R918 Other nonspecific abnormal finding of lung field: Secondary | ICD-10-CM | POA: Diagnosis not present

## 2023-12-11 DIAGNOSIS — I10 Essential (primary) hypertension: Secondary | ICD-10-CM | POA: Diagnosis present

## 2023-12-11 DIAGNOSIS — R140 Abdominal distension (gaseous): Secondary | ICD-10-CM | POA: Diagnosis not present

## 2023-12-11 DIAGNOSIS — E1142 Type 2 diabetes mellitus with diabetic polyneuropathy: Secondary | ICD-10-CM | POA: Diagnosis not present

## 2023-12-11 DIAGNOSIS — R0902 Hypoxemia: Principal | ICD-10-CM

## 2023-12-11 DIAGNOSIS — Z79891 Long term (current) use of opiate analgesic: Secondary | ICD-10-CM

## 2023-12-11 DIAGNOSIS — M25551 Pain in right hip: Secondary | ICD-10-CM | POA: Diagnosis present

## 2023-12-11 DIAGNOSIS — E669 Obesity, unspecified: Secondary | ICD-10-CM | POA: Diagnosis not present

## 2023-12-11 DIAGNOSIS — J168 Pneumonia due to other specified infectious organisms: Secondary | ICD-10-CM | POA: Diagnosis not present

## 2023-12-11 DIAGNOSIS — J189 Pneumonia, unspecified organism: Secondary | ICD-10-CM | POA: Diagnosis not present

## 2023-12-11 DIAGNOSIS — T451X5A Adverse effect of antineoplastic and immunosuppressive drugs, initial encounter: Secondary | ICD-10-CM | POA: Diagnosis present

## 2023-12-11 DIAGNOSIS — J9601 Acute respiratory failure with hypoxia: Secondary | ICD-10-CM

## 2023-12-11 DIAGNOSIS — Z86711 Personal history of pulmonary embolism: Secondary | ICD-10-CM

## 2023-12-11 DIAGNOSIS — Z7985 Long-term (current) use of injectable non-insulin antidiabetic drugs: Secondary | ICD-10-CM | POA: Diagnosis not present

## 2023-12-11 DIAGNOSIS — Z7984 Long term (current) use of oral hypoglycemic drugs: Secondary | ICD-10-CM | POA: Diagnosis not present

## 2023-12-11 DIAGNOSIS — I110 Hypertensive heart disease with heart failure: Secondary | ICD-10-CM | POA: Diagnosis not present

## 2023-12-11 DIAGNOSIS — Z7901 Long term (current) use of anticoagulants: Secondary | ICD-10-CM | POA: Diagnosis not present

## 2023-12-11 DIAGNOSIS — R000 Tachycardia, unspecified: Secondary | ICD-10-CM | POA: Diagnosis not present

## 2023-12-11 DIAGNOSIS — Z6833 Body mass index (BMI) 33.0-33.9, adult: Secondary | ICD-10-CM | POA: Diagnosis not present

## 2023-12-11 DIAGNOSIS — J9691 Respiratory failure, unspecified with hypoxia: Secondary | ICD-10-CM | POA: Diagnosis present

## 2023-12-11 DIAGNOSIS — K567 Ileus, unspecified: Secondary | ICD-10-CM | POA: Diagnosis not present

## 2023-12-11 DIAGNOSIS — Z833 Family history of diabetes mellitus: Secondary | ICD-10-CM | POA: Diagnosis not present

## 2023-12-11 DIAGNOSIS — J440 Chronic obstructive pulmonary disease with acute lower respiratory infection: Secondary | ICD-10-CM | POA: Diagnosis not present

## 2023-12-11 DIAGNOSIS — Z9841 Cataract extraction status, right eye: Secondary | ICD-10-CM

## 2023-12-11 DIAGNOSIS — E1165 Type 2 diabetes mellitus with hyperglycemia: Secondary | ICD-10-CM | POA: Diagnosis not present

## 2023-12-11 DIAGNOSIS — I5032 Chronic diastolic (congestive) heart failure: Secondary | ICD-10-CM | POA: Diagnosis not present

## 2023-12-11 DIAGNOSIS — Z888 Allergy status to other drugs, medicaments and biological substances status: Secondary | ICD-10-CM

## 2023-12-11 DIAGNOSIS — R079 Chest pain, unspecified: Secondary | ICD-10-CM | POA: Diagnosis not present

## 2023-12-11 DIAGNOSIS — Z885 Allergy status to narcotic agent status: Secondary | ICD-10-CM

## 2023-12-11 DIAGNOSIS — J449 Chronic obstructive pulmonary disease, unspecified: Secondary | ICD-10-CM | POA: Diagnosis not present

## 2023-12-11 DIAGNOSIS — Z86718 Personal history of other venous thrombosis and embolism: Secondary | ICD-10-CM

## 2023-12-11 DIAGNOSIS — C3491 Malignant neoplasm of unspecified part of right bronchus or lung: Secondary | ICD-10-CM | POA: Diagnosis present

## 2023-12-11 DIAGNOSIS — J90 Pleural effusion, not elsewhere classified: Secondary | ICD-10-CM | POA: Diagnosis not present

## 2023-12-11 DIAGNOSIS — E1169 Type 2 diabetes mellitus with other specified complication: Secondary | ICD-10-CM

## 2023-12-11 DIAGNOSIS — Z22322 Carrier or suspected carrier of Methicillin resistant Staphylococcus aureus: Secondary | ICD-10-CM

## 2023-12-11 DIAGNOSIS — R112 Nausea with vomiting, unspecified: Secondary | ICD-10-CM | POA: Diagnosis not present

## 2023-12-11 DIAGNOSIS — Z79899 Other long term (current) drug therapy: Secondary | ICD-10-CM

## 2023-12-11 DIAGNOSIS — G8929 Other chronic pain: Secondary | ICD-10-CM | POA: Diagnosis present

## 2023-12-11 DIAGNOSIS — Z87440 Personal history of urinary (tract) infections: Secondary | ICD-10-CM

## 2023-12-11 DIAGNOSIS — Z9842 Cataract extraction status, left eye: Secondary | ICD-10-CM

## 2023-12-11 DIAGNOSIS — D701 Agranulocytosis secondary to cancer chemotherapy: Secondary | ICD-10-CM | POA: Diagnosis not present

## 2023-12-11 DIAGNOSIS — R071 Chest pain on breathing: Secondary | ICD-10-CM | POA: Diagnosis not present

## 2023-12-11 DIAGNOSIS — Z95828 Presence of other vascular implants and grafts: Secondary | ICD-10-CM | POA: Diagnosis not present

## 2023-12-11 DIAGNOSIS — Z91040 Latex allergy status: Secondary | ICD-10-CM

## 2023-12-11 DIAGNOSIS — M47817 Spondylosis without myelopathy or radiculopathy, lumbosacral region: Secondary | ICD-10-CM | POA: Diagnosis present

## 2023-12-11 DIAGNOSIS — J9621 Acute and chronic respiratory failure with hypoxia: Secondary | ICD-10-CM | POA: Diagnosis not present

## 2023-12-11 DIAGNOSIS — Z8249 Family history of ischemic heart disease and other diseases of the circulatory system: Secondary | ICD-10-CM | POA: Diagnosis not present

## 2023-12-11 DIAGNOSIS — R791 Abnormal coagulation profile: Secondary | ICD-10-CM | POA: Diagnosis present

## 2023-12-11 DIAGNOSIS — M810 Age-related osteoporosis without current pathological fracture: Secondary | ICD-10-CM | POA: Diagnosis present

## 2023-12-11 DIAGNOSIS — I824Y9 Acute embolism and thrombosis of unspecified deep veins of unspecified proximal lower extremity: Secondary | ICD-10-CM | POA: Diagnosis not present

## 2023-12-11 DIAGNOSIS — C3490 Malignant neoplasm of unspecified part of unspecified bronchus or lung: Secondary | ICD-10-CM | POA: Diagnosis not present

## 2023-12-11 DIAGNOSIS — R0602 Shortness of breath: Secondary | ICD-10-CM | POA: Diagnosis not present

## 2023-12-11 DIAGNOSIS — M79605 Pain in left leg: Secondary | ICD-10-CM | POA: Diagnosis not present

## 2023-12-11 DIAGNOSIS — Z961 Presence of intraocular lens: Secondary | ICD-10-CM | POA: Diagnosis present

## 2023-12-11 DIAGNOSIS — J984 Other disorders of lung: Secondary | ICD-10-CM | POA: Diagnosis not present

## 2023-12-11 DIAGNOSIS — I700 Atherosclerosis of aorta: Secondary | ICD-10-CM | POA: Diagnosis not present

## 2023-12-11 DIAGNOSIS — Z9109 Other allergy status, other than to drugs and biological substances: Secondary | ICD-10-CM

## 2023-12-11 DIAGNOSIS — M79604 Pain in right leg: Secondary | ICD-10-CM | POA: Diagnosis not present

## 2023-12-11 LAB — CBC WITH DIFFERENTIAL/PLATELET
Abs Immature Granulocytes: 0.02 K/uL (ref 0.00–0.07)
Basophils Absolute: 0 K/uL (ref 0.0–0.1)
Basophils Relative: 0 %
Eosinophils Absolute: 0 K/uL (ref 0.0–0.5)
Eosinophils Relative: 1 %
HCT: 33.8 % — ABNORMAL LOW (ref 36.0–46.0)
Hemoglobin: 11.2 g/dL — ABNORMAL LOW (ref 12.0–15.0)
Immature Granulocytes: 1 %
Lymphocytes Relative: 16 %
Lymphs Abs: 0.6 K/uL — ABNORMAL LOW (ref 0.7–4.0)
MCH: 29.9 pg (ref 26.0–34.0)
MCHC: 33.1 g/dL (ref 30.0–36.0)
MCV: 90.1 fL (ref 80.0–100.0)
Monocytes Absolute: 0.6 K/uL (ref 0.1–1.0)
Monocytes Relative: 15 %
Neutro Abs: 2.6 K/uL (ref 1.7–7.7)
Neutrophils Relative %: 67 %
Platelets: 80 K/uL — ABNORMAL LOW (ref 150–400)
RBC: 3.75 MIL/uL — ABNORMAL LOW (ref 3.87–5.11)
RDW: 13.1 % (ref 11.5–15.5)
WBC: 3.8 K/uL — ABNORMAL LOW (ref 4.0–10.5)
nRBC: 0 % (ref 0.0–0.2)

## 2023-12-11 LAB — COMPREHENSIVE METABOLIC PANEL WITH GFR
ALT: 10 U/L (ref 0–44)
AST: 16 U/L (ref 15–41)
Albumin: 2.8 g/dL — ABNORMAL LOW (ref 3.5–5.0)
Alkaline Phosphatase: 40 U/L (ref 38–126)
Anion gap: 9 (ref 5–15)
BUN: 11 mg/dL (ref 8–23)
CO2: 23 mmol/L (ref 22–32)
Calcium: 8.5 mg/dL — ABNORMAL LOW (ref 8.9–10.3)
Chloride: 106 mmol/L (ref 98–111)
Creatinine, Ser: 0.74 mg/dL (ref 0.44–1.00)
GFR, Estimated: >60 mL/min (ref >60)
Glucose, Bld: 144 mg/dL — ABNORMAL HIGH (ref 70–99)
Potassium: 3.5 mmol/L (ref 3.5–5.1)
Sodium: 138 mmol/L (ref 135–145)
Total Bilirubin: 0.6 mg/dL (ref 0.0–1.2)
Total Protein: 7.5 g/dL (ref 6.5–8.1)

## 2023-12-11 LAB — PROCALCITONIN: Procalcitonin: 5.22 ng/mL

## 2023-12-11 LAB — BLOOD GAS, VENOUS
Acid-base deficit: 0.6 mmol/L (ref 0.0–2.0)
Bicarbonate: 24.9 mmol/L (ref 20.0–28.0)
O2 Saturation: 82.8 %
Patient temperature: 37
pCO2, Ven: 43 mmHg — ABNORMAL LOW (ref 44–60)
pH, Ven: 7.37 (ref 7.25–7.43)
pO2, Ven: 50 mmHg — ABNORMAL HIGH (ref 32–45)

## 2023-12-11 LAB — PROTIME-INR
INR: 2.9 — ABNORMAL HIGH (ref 0.8–1.2)
Prothrombin Time: 31.9 s — ABNORMAL HIGH (ref 11.4–15.2)

## 2023-12-11 LAB — TROPONIN I (HIGH SENSITIVITY)
Troponin I (High Sensitivity): 13 ng/L (ref <18)
Troponin I (High Sensitivity): 12 ng/L (ref <18)

## 2023-12-11 LAB — LIPASE, BLOOD: Lipase: 23 U/L (ref 11–51)

## 2023-12-11 LAB — LACTIC ACID, PLASMA
Lactic Acid, Venous: 1 mmol/L (ref 0.5–1.9)
Lactic Acid, Venous: 0.7 mmol/L (ref 0.5–1.9)

## 2023-12-11 LAB — BRAIN NATRIURETIC PEPTIDE: B Natriuretic Peptide: 96.8 pg/mL (ref 0.0–100.0)

## 2023-12-11 MED ORDER — METOPROLOL TARTRATE 5 MG/5ML IV SOLN: 5.0000 mg | Freq: Four times a day (QID) | INTRAVENOUS | Status: DC | PRN

## 2023-12-11 MED ORDER — HYDROCODONE-ACETAMINOPHEN 5-325 MG PO TABS
1.0000 | ORAL_TABLET | Freq: Four times a day (QID) | ORAL | Status: DC | PRN
  Administered 2023-12-12 – 2023-12-19 (×10): 1 via ORAL
  Filled 2023-12-11 (×10): qty 1

## 2023-12-11 MED ORDER — INSULIN ASPART 100 UNIT/ML IJ SOLN
0.0000 [IU] | Freq: Every day | INTRAMUSCULAR | Status: DC
  Administered 2023-12-14: 2 [IU] via SUBCUTANEOUS
  Administered 2023-12-15: 3 [IU] via SUBCUTANEOUS
  Administered 2023-12-16: 2 [IU] via SUBCUTANEOUS
  Administered 2023-12-17: 5 [IU] via SUBCUTANEOUS
  Administered 2023-12-18: 3 [IU] via SUBCUTANEOUS
  Administered 2023-12-19: 4 [IU] via SUBCUTANEOUS
  Filled 2023-12-11 (×6): qty 1

## 2023-12-11 MED ORDER — ROPINIROLE HCL 1 MG PO TABS
1.0000 mg | ORAL_TABLET | Freq: Every day | ORAL | Status: DC
  Administered 2023-12-12 – 2023-12-19 (×9): 1 mg via ORAL
  Filled 2023-12-11 (×9): qty 1

## 2023-12-11 MED ORDER — POLYETHYLENE GLYCOL 3350 17 G PO PACK: 17.0000 g | PACK | Freq: Every day | ORAL | Status: DC | PRN

## 2023-12-11 MED ORDER — CYCLOBENZAPRINE HCL 10 MG PO TABS
10.0000 mg | ORAL_TABLET | Freq: Every day | ORAL | Status: DC
  Administered 2023-12-12 – 2023-12-19 (×9): 10 mg via ORAL
  Filled 2023-12-11 (×9): qty 1

## 2023-12-11 MED ORDER — PROCHLORPERAZINE MALEATE 10 MG PO TABS
10.0000 mg | ORAL_TABLET | Freq: Four times a day (QID) | ORAL | Status: DC | PRN
  Administered 2023-12-16 – 2023-12-18 (×3): 10 mg via ORAL
  Filled 2023-12-11 (×4): qty 1

## 2023-12-11 MED ORDER — IRBESARTAN 150 MG PO TABS
300.0000 mg | ORAL_TABLET | Freq: Every day | ORAL | Status: DC
  Administered 2023-12-13: 300 mg via ORAL
  Filled 2023-12-11 (×2): qty 2

## 2023-12-11 MED ORDER — POTASSIUM CHLORIDE CRYS ER 20 MEQ PO TBCR
20.0000 meq | EXTENDED_RELEASE_TABLET | Freq: Every day | ORAL | Status: DC
  Administered 2023-12-12 – 2023-12-20 (×8): 20 meq via ORAL
  Filled 2023-12-11 (×8): qty 1

## 2023-12-11 MED ORDER — SODIUM CHLORIDE 0.9 % IV SOLN
250.0000 mL | INTRAVENOUS | Status: AC | PRN
  Administered 2023-12-12: 250 mL via INTRAVENOUS

## 2023-12-11 MED ORDER — MAGNESIUM OXIDE -MG SUPPLEMENT 400 (240 MG) MG PO TABS
400.0000 mg | ORAL_TABLET | Freq: Every day | ORAL | Status: DC
  Administered 2023-12-12 – 2023-12-19 (×9): 400 mg via ORAL
  Filled 2023-12-11 (×9): qty 1

## 2023-12-11 MED ORDER — SODIUM CHLORIDE 0.9% FLUSH
3.0000 mL | Freq: Two times a day (BID) | INTRAVENOUS | Status: DC
  Administered 2023-12-12 – 2023-12-20 (×17): 3 mL via INTRAVENOUS

## 2023-12-11 MED ORDER — PANTOPRAZOLE SODIUM 40 MG PO TBEC
40.0000 mg | DELAYED_RELEASE_TABLET | Freq: Every day | ORAL | Status: DC
  Administered 2023-12-12 – 2023-12-20 (×8): 40 mg via ORAL
  Filled 2023-12-11 (×8): qty 1

## 2023-12-11 MED ORDER — WARFARIN SODIUM 5 MG PO TABS
5.0000 mg | ORAL_TABLET | Freq: Every day | ORAL | Status: DC
  Filled 2023-12-11: qty 1

## 2023-12-11 MED ORDER — INSULIN ASPART 100 UNIT/ML IJ SOLN
0.0000 [IU] | Freq: Three times a day (TID) | INTRAMUSCULAR | Status: DC
  Administered 2023-12-12: 2 [IU] via SUBCUTANEOUS
  Administered 2023-12-13 (×2): 3 [IU] via SUBCUTANEOUS
  Administered 2023-12-13: 5 [IU] via SUBCUTANEOUS
  Administered 2023-12-14 – 2023-12-15 (×4): 3 [IU] via SUBCUTANEOUS
  Administered 2023-12-15: 2 [IU] via SUBCUTANEOUS
  Administered 2023-12-15: 8 [IU] via SUBCUTANEOUS
  Administered 2023-12-16: 3 [IU] via SUBCUTANEOUS
  Administered 2023-12-16: 8 [IU] via SUBCUTANEOUS
  Administered 2023-12-16 – 2023-12-17 (×2): 3 [IU] via SUBCUTANEOUS
  Administered 2023-12-17: 5 [IU] via SUBCUTANEOUS
  Administered 2023-12-17: 11 [IU] via SUBCUTANEOUS
  Administered 2023-12-18: 8 [IU] via SUBCUTANEOUS
  Administered 2023-12-18: 5 [IU] via SUBCUTANEOUS
  Administered 2023-12-19: 2 [IU] via SUBCUTANEOUS
  Administered 2023-12-19: 8 [IU] via SUBCUTANEOUS
  Administered 2023-12-19: 15 [IU] via SUBCUTANEOUS
  Administered 2023-12-20: 3 [IU] via SUBCUTANEOUS
  Administered 2023-12-20: 5 [IU] via SUBCUTANEOUS
  Filled 2023-12-11 (×23): qty 1

## 2023-12-11 MED ORDER — AZITHROMYCIN 500 MG PO TABS
500.0000 mg | ORAL_TABLET | Freq: Every day | ORAL | Status: AC
  Administered 2023-12-12 – 2023-12-16 (×5): 500 mg via ORAL
  Filled 2023-12-11 (×5): qty 1

## 2023-12-11 MED ORDER — OXYBUTYNIN CHLORIDE 5 MG PO TABS
5.0000 mg | ORAL_TABLET | Freq: Two times a day (BID) | ORAL | Status: DC
  Administered 2023-12-12 – 2023-12-20 (×15): 5 mg via ORAL
  Filled 2023-12-11 (×16): qty 1

## 2023-12-11 MED ORDER — AZITHROMYCIN 500 MG PO TABS
500.0000 mg | ORAL_TABLET | Freq: Every day | ORAL | Status: DC
Start: 2023-12-12 — End: 2023-12-11

## 2023-12-11 MED ORDER — GABAPENTIN 300 MG PO CAPS
300.0000 mg | ORAL_CAPSULE | Freq: Every day | ORAL | Status: DC
  Administered 2023-12-12 – 2023-12-19 (×9): 300 mg via ORAL
  Filled 2023-12-11 (×9): qty 1

## 2023-12-11 MED ORDER — SUCRALFATE 1 G PO TABS
1.0000 g | ORAL_TABLET | Freq: Three times a day (TID) | ORAL | Status: DC
Start: 2023-12-12 — End: 2023-12-20
  Administered 2023-12-12 – 2023-12-20 (×22): 1 g via ORAL
  Filled 2023-12-11 (×23): qty 1

## 2023-12-11 MED ORDER — SODIUM CHLORIDE 0.9% FLUSH: 3.0000 mL | INTRAVENOUS | Status: DC | PRN

## 2023-12-11 MED ORDER — SODIUM CHLORIDE 0.9 % IV SOLN
2.0000 g | INTRAVENOUS | Status: AC
  Administered 2023-12-12 – 2023-12-15 (×5): 2 g via INTRAVENOUS
  Filled 2023-12-11 (×5): qty 20

## 2023-12-11 MED ORDER — OSIMERTINIB MESYLATE 80 MG PO TABS
80.0000 mg | ORAL_TABLET | Freq: Every day | ORAL | Status: DC
  Administered 2023-12-13 – 2023-12-20 (×7): 80 mg via ORAL
  Filled 2023-12-11 (×8): qty 1

## 2023-12-11 MED ORDER — GLIPIZIDE 10 MG PO TABS
10.0000 mg | ORAL_TABLET | Freq: Every day | ORAL | Status: DC
  Administered 2023-12-12: 10 mg via ORAL
  Filled 2023-12-11: qty 1

## 2023-12-11 MED ORDER — VITAMIN B-12 1000 MCG PO TABS
5000.0000 ug | ORAL_TABLET | Freq: Every morning | ORAL | Status: DC
  Filled 2023-12-11: qty 5

## 2023-12-11 MED ORDER — IOHEXOL 350 MG/ML SOLN
100.0000 mL | Freq: Once | INTRAVENOUS | Status: AC | PRN
  Administered 2023-12-11: 100 mL via INTRAVENOUS

## 2023-12-11 MED ORDER — ALBUTEROL SULFATE (2.5 MG/3ML) 0.083% IN NEBU
2.5000 mg | INHALATION_SOLUTION | Freq: Four times a day (QID) | RESPIRATORY_TRACT | Status: DC
  Administered 2023-12-12 – 2023-12-18 (×27): 2.5 mg via RESPIRATORY_TRACT
  Filled 2023-12-11 (×27): qty 3

## 2023-12-11 MED ORDER — GABAPENTIN 100 MG PO CAPS
200.0000 mg | ORAL_CAPSULE | Freq: Three times a day (TID) | ORAL | Status: DC
  Administered 2023-12-12 – 2023-12-20 (×22): 200 mg via ORAL
  Filled 2023-12-11 (×24): qty 2

## 2023-12-11 MED ORDER — VITAMIN D 25 MCG (1000 UNIT) PO TABS
1000.0000 [IU] | ORAL_TABLET | Freq: Every day | ORAL | Status: DC
  Administered 2023-12-12 – 2023-12-20 (×8): 1000 [IU] via ORAL
  Filled 2023-12-11 (×8): qty 1

## 2023-12-11 NOTE — Assessment & Plan Note (Addendum)
 On gabapentin.

## 2023-12-11 NOTE — ED Triage Notes (Signed)
 Pt recently admitted for pneumonia and sepsis, currently CP, SHOB, and weakness from home, 90 % RA 3L with EMS up to 94-96% ASA given  for CP, EKG unremarkable with EMS

## 2023-12-11 NOTE — H&P (Signed)
 History and Physical    Patient: Kara [name] FMW:[id number] DOB: December 04, 1950 DOA: 12/11/2023 DOS: the patient was seen and examined on 12/11/2023 PCP: Sadie Manna, MD  Patient coming from: Home  Chief Complaint:  Chief Complaint  Patient presents with   Chest Pain   HPI: Kara [name] is a 73 y.o. adult with medical history significant of T2DM, HTN, history of PE, non-small cell lung cancer with recurrence who was recently admitted with sepsis related to pneumonia.  She was discharged home approximately 2 days ago on Levaquin  and was taking this as directed.  Additionally her lung cancer has recurred and she is on adjuvant Tagrisso  daily.  She returns with worsening shortness of breath.  In the ED she was noted to be tachypneic and hypoxic requiring 2 L of nasal cannula to maintain her sats.  Her labs were fairly stable and unremarkable including her lactic acid.  Her INR was elevated at 2.9 but she is on chronic Coumadin .  Chest x-ray showed a right hilar mass.  Workup also revealed a new right DVT.  Patient reports feeling like she is wheezing as well.  Patient reports nonproductive cough for the last 4 days. Review of Systems: As mentioned in the history of present illness. All other systems reviewed and are negative. 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, 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   CHF (congestive heart failure) (HCC)    followed colon resection, wouldn't let me get up   Chronic anticoagulation    Degenerative disc disease, lumbar    Diverticulitis 2018   Diverticulosis    DVT of lower extremity, bilateral (HCC) 2018   Essential hypertension    History of being hospitalized    1 month ago for 3 days, vomiting, and kink in upper intestine   History of hiatal hernia    Mediastinal adenopathy 04/2023   MRSA nasal colonization    Non-small cell lung cancer (HCC) 2022   right   Obesity (BMI  30-39.9)    Osteoarthritis    Osteoporosis    PE (pulmonary thromboembolism) (HCC) 2018   Pneumonia    Small bowel obstruction (HCC) 03/20/2023   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HCC)    Type 2 diabetes mellitus with obesity (HCC)    Vertigo    Past Surgical History:  Procedure Laterality Date   APPENDECTOMY N/A 02/24/2017   Procedure: Incidental  APPENDECTOMY;  Surgeon: Shelva Dunnings, MD;  Location: [name];  Service: General;  Laterality: N/A;   CARPAL TUNNEL RELEASE Bilateral    CATARACT EXTRACTION W/PHACO Left 11/21/2018   Procedure: CATARACT EXTRACTION PHACO AND INTRAOCULAR LENS PLACEMENT (IOC) LEFT DIABETES;  Surgeon: Jaye Fallow, MD;  Location: [facility];  Service: Ophthalmology;  Laterality: Left;  latex sensitivity Diabetic - oral meds   CATARACT EXTRACTION W/PHACO Right 12/12/2018   Procedure: CATARACT EXTRACTION PHACO AND INTRAOCULAR LENS PLACEMENT (IOC) RIGHT DIABETES;  Surgeon: Jaye Fallow, MD;  Location: [facility];  Service: Ophthalmology;  Laterality: Right;  Diabetes-oral med Latex sensitiviy   COLON RESECTION SIGMOID N/A 11/02/2016   Procedure: COLON RESECTION SIGMOID;  Surgeon: Shelva Dunnings, MD;  Location: [name];  Service: General;  Laterality: N/A;   COLON SURGERY  11/02/2016   COLONOSCOPY WITH PROPOFOL  N/A 02/03/2017   Procedure: COLONOSCOPY WITH PROPOFOL ;  Surgeon: Unk Corinn Skiff, MD;  Location: [name];  Service: Gastroenterology;  Laterality: N/A;   COLOSTOMY  N/A 11/02/2016   Procedure: COLOSTOMY;  Surgeon: Shelva Dunnings, MD;  Location: [name];  Service: General;  Laterality: N/A;   COLOSTOMY REVERSAL N/A 02/24/2017   Procedure: COLOSTOMY REVERSAL, ostomy takedown, spleenic flexure mobilization, excision rectal stump/distal sigmoid, anastomosis with suture reinforcement;  Surgeon: Shelva Dunnings, MD;  Location: [name];  Service: General;  Laterality: N/A;   ENDOBRONCHIAL ULTRASOUND N/A 05/16/2023    Procedure: ENDOBRONCHIAL ULTRASOUND;  Surgeon: Tamea Dedra CROME, MD;  Location: [name];  Service: Cardiopulmonary;  Laterality: N/A;   FLEXIBLE BRONCHOSCOPY N/A 05/16/2023   Procedure: FLEXIBLE BRONCHOSCOPY;  Surgeon: Tamea Dedra CROME, MD;  Location: [name];  Service: Cardiopulmonary;  Laterality: N/A;   ILEO LOOP DIVERSION N/A 02/24/2017   Procedure: ILEO LOOP COLOSTOMY;  Surgeon: Shelva Dunnings, MD;  Location: [name];  Service: General;  Laterality: N/A;   ILEOSTOMY CLOSURE N/A 06/01/2017   Procedure: LOOP ILEOSTOMY TAKEDOWN;  Surgeon: Shelva Dunnings, MD;  Location: [name];  Service: General;  Laterality: N/A;   IR IMAGING GUIDED PORT INSERTION  06/14/2023   IR REMOVAL TUN ACCESS W/ PORT W/O FL MOD SED  11/16/2023   IVC FILTER INSERTION Right 10/2016   IVC FILTER REMOVAL N/A 08/09/2017   Procedure: IVC FILTER REMOVAL;  Surgeon: Jama Cordella MATSU, MD;  Location: [name];  Service: Cardiovascular;  Laterality: N/A;   KNEE SURGERY Right    torn menicus   LAPAROSCOPY N/A 02/24/2017   Procedure: LAPAROSCOPY DIAGNOSTIC;  Surgeon: Shelva Dunnings, MD;  Location: [name];  Service: General;  Laterality: N/A;   LYSIS OF ADHESION N/A 02/24/2017   Procedure: LYSIS OF ADHESION;  Surgeon: Shelva Dunnings, MD;  Location: [name];  Service: General;  Laterality: N/A;   PULMONARY VENOGRAPHY N/A 11/19/2016   Procedure: Pulmonary Venography; IVC filter placement; possible pulmonary thrombectomy;  Surgeon: Jama Cordella MATSU, MD;  Location: [name];  Service: Cardiovascular;  Laterality: N/A;   SACROPLASTY N/A 11/08/2017   Procedure: SACROPLASTY S2;  Surgeon: Kathlynn Sharper, MD;  Location: [name];  Service: Orthopedics;  Laterality: N/A;   SIGMOIDOSCOPY N/A 11/13/2016   Procedure: endoscopic  flexible SIGMOIDOSCOPY;  Surgeon: Wonda Charlie BRAVO, MD;  Location: [name];  Service: General;  Laterality: N/A;   SIGMOIDOSCOPY N/A 05/19/2017   Procedure: ENID MORIN;  Surgeon: Shelva Dunnings, MD;  Location: [name];  Service: General;  Laterality: N/A;   VIDEO BRONCHOSCOPY WITH ENDOBRONCHIAL NAVIGATION N/A 02/23/2021   Procedure: ROBOTIC VIDEO BRONCHOSCOPY WITH ENDOBRONCHIAL NAVIGATION;  Surgeon: Tamea Dedra CROME, MD;  Location: [name];  Service: Pulmonary;  Laterality: N/A;   Social History:  reports that she has never smoked. She has been exposed to tobacco smoke. She has never used smokeless tobacco. She reports that she does not drink alcohol  and does not use drugs.  Allergies  Allergen Reactions   Adhesive [Tape] Other (See Comments)    Pt reports, when removing tape from skin most tapes pull her skin off. Ok to use paper tape   Avapro  [Irbesartan ] Anaphylaxis    Facial swelling   Morphine  And Codeine  Nausea And Vomiting   Other Hives    Chlorhexadine wipes/CHG wipes,    Latex Rash    Exam gloves/ does not react around elastic and lips don't swell when blowing balloons    Family History  Problem Relation Age of Onset   Diabetes Mother    Heart disease Mother    Cancer Mother    Breast cancer Mother        >  50   Heart disease Father    Diabetes Sister    Heart disease Sister     Prior to Admission medications   Medication Sig Start Date End Date Taking? Authorizing Provider  bisacodyl  (DULCOLAX) 10 MG suppository Place 1 suppository (10 mg total) rectally as needed for moderate constipation. Patient not taking: Reported on 12/08/2023 07/06/23   Viviann Pastor, MD  candesartan (ATACAND) 16 MG tablet Take 16 mg by mouth in the morning. 08/03/17   [provider]  cholecalciferol  (VITAMIN D ) 25 MCG (1000 UNIT) tablet Take 1,000 Units by mouth in the morning.    [provider]  Cyanocobalamin  (VITAMIN B-12) 5000 MCG TBDP Take 5,000 mcg by mouth in the morning.    [provider]  cyclobenzaprine  (FLEXERIL ) 10 MG tablet Take 10 mg by mouth at bedtime. 07/05/16   [provider]   Dulaglutide (TRULICITY) 1.5 MG/0.5ML SOAJ Inject 1.5 mg into the skin once a week. Saturday 07/28/23 07/27/24  [provider]  gabapentin  (NEURONTIN ) 100 MG capsule Take 200 mg by mouth with breakfast, with lunch, and with evening meal.    [provider]  gabapentin  (NEURONTIN ) 300 MG capsule Take 300 mg by mouth at bedtime.    [provider]  glipiZIDE  (GLUCOTROL ) 10 MG tablet Take 10 mg by mouth daily before breakfast.    [provider]  HYDROcodone -acetaminophen  (NORCO/VICODIN) 5-325 MG tablet Take 1 tablet by mouth every 6 (six) hours as needed for moderate pain.    [provider]  levofloxacin  (LEVAQUIN ) 750 MG tablet Take 1 tablet (750 mg total) by mouth daily for 3 days. Start on 12/10/2023 12/09/23 12/12/23  Kandis Devaughn Sayres, MD  Magnesium  Oxide -Mg Supplement 500 MG TABS Take 1 tablet by mouth at bedtime.    [provider]  omeprazole  (PRILOSEC) 20 MG capsule Take 1 capsule (20 mg total) by mouth daily. 08/30/23   Dasie Tinnie MATSU, NP  ondansetron  (ZOFRAN ) 8 MG tablet TAKE 1 TABLET BY MOUTH EVERY 8 HOURS AS NEEDED FOR NAUSEA OR VOMITING. START ON THE 3RD DAY AFTER CHEMOTHERAPY 08/15/23   Melanee Annah BROCKS, MD  [facility] ULTRA TEST test strip  01/25/23   [provider]  osimertinib  mesylate (TAGRISSO ) 80 MG tablet Take 1 tablet (80 mg total) by mouth daily. 11/17/23   Melanee Annah BROCKS, MD  oxybutynin  (DITROPAN ) 5 MG tablet Take 1 tablet by mouth 2 (two) times daily. 02/25/22   [provider]  potassium chloride  SA (KLOR-CON  M) 20 MEQ tablet Take 1 tablet (20 mEq total) by mouth daily. 07/22/23   Melanee Annah BROCKS, MD  prochlorperazine  (COMPAZINE ) 10 MG tablet Take 1 tablet (10 mg total) by mouth every 6 (six) hours as needed for nausea or vomiting. 08/30/23   Dasie Tinnie MATSU, NP  rOPINIRole  (REQUIP ) 1 MG tablet Take 1 mg by mouth at bedtime. 06/22/23   [provider]  sucralfate  (CARAFATE ) 1 g tablet Take 1 tablet (1 g total) by  mouth 3 (three) times daily with meals. Dissolve tablet in 4 tablesppons warm water swish and swallow 30 min before meals. Patient taking differently: Take 1 g by mouth 3 (three) times daily with meals. Dissolve tablet in 4 tablespoons warm water swish and swallow 30 min before meals. 07/22/23 12/08/23  Lenn Aran, MD  warfarin (COUMADIN ) 5 MG tablet Take 5 mg by mouth daily. 06/01/23   [provider]    Physical Exam: Vitals:   12/11/23 2100 12/11/23 2102  BP:  ROLLEN)  121/53  Pulse: 99   Resp: 16   Temp: 98.3 F (36.8 C)   TempSrc: Oral   SpO2: 92%    Physical Examination: General appearance - in mild to moderate distress and ill-appearing Neck - supple, no significant adenopathy Chest - wheezing noted right Heart - normal rate, regular rhythm, normal S1, S2, no murmurs, rubs, clicks or gallops Abdomen - soft, nontender, nondistended, no masses or organomegaly Extremities - peripheral pulses normal, no pedal edema, no clubbing or cyanosis  Data Reviewed: Results for orders placed or performed during the hospital encounter of 12/11/23 (from the past 24 hours)  CBC with Differential     Status: Abnormal   Collection Time: 12/11/23  9:06 PM  Result Value Ref Range   WBC 3.8 (L) 4.0 - 10.5 K/uL   RBC 3.75 (L) 3.87 - 5.11 MIL/uL   Hemoglobin 11.2 (L) 12.0 - 15.0 g/dL   HCT 66.1 (L) 63.9 - 53.9 %   MCV 90.1 80.0 - 100.0 fL   MCH 29.9 26.0 - 34.0 pg   MCHC 33.1 30.0 - 36.0 g/dL   RDW 86.8 88.4 - 84.4 %   Platelets 80 (L) 150 - 400 K/uL   nRBC 0.0 0.0 - 0.2 %   Neutrophils Relative % 67 %   Neutro Abs 2.6 1.7 - 7.7 K/uL   Lymphocytes Relative 16 %   Lymphs Abs 0.6 (L) 0.7 - 4.0 K/uL   Monocytes Relative 15 %   Monocytes Absolute 0.6 0.1 - 1.0 K/uL   Eosinophils Relative 1 %   Eosinophils Absolute 0.0 0.0 - 0.5 K/uL   Basophils Relative 0 %   Basophils Absolute 0.0 0.0 - 0.1 K/uL   Immature Granulocytes 1 %   Abs Immature Granulocytes 0.02 0.00 - 0.07 K/uL   Comprehensive metabolic panel     Status: Abnormal   Collection Time: 12/11/23  9:06 PM  Result Value Ref Range   Sodium 138 135 - 145 mmol/L   Potassium 3.5 3.5 - 5.1 mmol/L   Chloride 106 98 - 111 mmol/L   CO2 23 22 - 32 mmol/L   Glucose, Bld 144 (H) 70 - 99 mg/dL   BUN 11 8 - 23 mg/dL   Creatinine, Ser 9.25 0.44 - 1.00 mg/dL   Calcium  8.5 (L) 8.9 - 10.3 mg/dL   Total Protein 7.5 6.5 - 8.1 g/dL   Albumin 2.8 (L) 3.5 - 5.0 g/dL   AST 16 15 - 41 U/L   ALT 10 0 - 44 U/L   Alkaline Phosphatase 40 38 - 126 U/L   Total Bilirubin 0.6 0.0 - 1.2 mg/dL   GFR, Estimated >39 >39 mL/min   Anion gap 9 5 - 15  Lipase, blood     Status: None   Collection Time: 12/11/23  9:06 PM  Result Value Ref Range   Lipase 23 11 - 51 U/L  Brain natriuretic peptide     Status: None   Collection Time: 12/11/23  9:06 PM  Result Value Ref Range   B Natriuretic Peptide 96.8 0.0 - 100.0 pg/mL  Troponin I (High Sensitivity)     Status: None   Collection Time: 12/11/23  9:06 PM  Result Value Ref Range   Troponin I (High Sensitivity) 13 <18 ng/L  Procalcitonin     Status: None   Collection Time: 12/11/23  9:06 PM  Result Value Ref Range   Procalcitonin 5.22 ng/mL  Lactic acid, plasma     Status: None   Collection  Time: 12/11/23  9:06 PM  Result Value Ref Range   Lactic Acid, Venous 1.0 0.5 - 1.9 mmol/L  Protime-INR     Status: Abnormal   Collection Time: 12/11/23  9:06 PM  Result Value Ref Range   Prothrombin  Time 31.9 (H) 11.4 - 15.2 seconds   INR 2.9 (H) 0.8 - 1.2  Lactic acid, plasma     Status: None   Collection Time: 12/11/23 11:10 PM  Result Value Ref Range   Lactic Acid, Venous 0.7 0.5 - 1.9 mmol/L   *Note: Due to a large number of results and/or encounters for the requested time period, some results have not been displayed. A complete set of results can be found in Results Review.   US  Venous Img Lower Bilateral Result Date: 12/11/2023 EXAM: ULTRASOUND DUPLEX OF THE BILATERAL LOWER  EXTREMITY VEINS TECHNIQUE: Duplex ultrasound using B-mode/gray scaled imaging and Doppler spectral analysis and color flow was obtained of the deep venous structures of the bilateral lower extremity. COMPARISON: None. CLINICAL HISTORY: Bilateral leg pain. FINDINGS: No deep venous thrombosis on the left. Occlusive thrombus in the right popliteal vein extending to the posterior tibial and peroneal veins of the calf. IMPRESSION: 1. Occlusive thrombus in the right popliteal vein extending to the posterior tibial and peroneal veins of the calf. 2. No deep venous thrombosis on the left. Electronically signed by: Pinkie Pebbles MD 12/11/2023 10:04 PM EDT RP Workstation: [id number]   DG Chest 1 View Result Date: 12/11/2023 CLINICAL DATA:  sob EXAM: CHEST  1 VIEW COMPARISON:  Chest x-ray 08/27/2023, CT chest 12/08/2023 FINDINGS: The heart and mediastinal contours are grossly unchanged. Atherosclerotic plaque. Redemonstration of right hilar masslike airspace opacity. Patchy airspace opacity of left lung. No pulmonary edema. No pleural effusion. No pneumothorax. No acute osseous abnormality. IMPRESSION: 1. Redemonstration of right hilar masslike airspace opacity that is better evaluated on CT chest 12/08/2023. 2. Patchy airspace opacity of left lung. 3.  Aortic Atherosclerosis (ICD10-I70.0). Electronically Signed   By: Morgane  Naveau M.D.   On: 12/11/2023 21:47   CT Angio Chest PE W and/or Wo Contrast Result Date: 12/08/2023 CLINICAL DATA:  Acute chest and abdominal pain, history of recent fall, initial encounter EXAM: CT ANGIOGRAPHY CHEST CT ABDOMEN AND PELVIS WITH CONTRAST TECHNIQUE: Multidetector CT imaging of the chest was performed using the standard protocol during bolus administration of intravenous contrast. Multiplanar CT image reconstructions and MIPs were obtained to evaluate the vascular anatomy. Multidetector CT imaging of the abdomen and pelvis was performed using the standard protocol during bolus  administration of intravenous contrast. RADIATION DOSE REDUCTION: This exam was performed according to the departmental dose-optimization program which includes automated exposure control, adjustment of the mA and/or kV according to patient size and/or use of iterative reconstruction technique. CONTRAST:  OMNIPAQUE  IOHEXOL  350 MG/ML SOLN COMPARISON:  10/06/2023 FINDINGS: CTA CHEST FINDINGS Cardiovascular: Atherosclerotic calcifications of the thoracic aorta are noted. No aneurysmal dilatation or dissection is seen. No cardiac enlargement is noted. The pulmonary artery shows a normal branching pattern bilaterally. No filling defect to suggest pulmonary embolism is noted. Mediastinum/Nodes: Thoracic inlet is within normal limits. Small mediastinal nodes are seen near the AP window and along the left pulmonary artery. These were not well visualized on the prior exam. These may be reactive in nature as they are not significant by size criteria. Previously seen right paratracheal nodes are less prominent on today's exam. The esophagus as visualized is within normal limits. Lungs/Pleura: The left lung is well aerated without  focal infiltrate or sizable effusion. Right lung again demonstrates right hilar and upper lobe density similar to that seen on the prior exam consistent with post treatment changes. Some patchy opacities are noted in the right upper lobe as well as in the medial aspect of the right middle lobe and right lower lobe new from the prior exam felt to represent acute infiltrate. Minimal right pleural effusion is noted. No parenchymal nodule is seen. Musculoskeletal: Degenerative changes of the thoracic spine are seen. No compression deformity is seen. Review of the MIP images confirms the above findings. CT ABDOMEN and PELVIS FINDINGS Hepatobiliary: Liver is within normal limits. Gallbladder is well distended with multiple dependent gallstones stable from the prior exam. Pancreas: Unremarkable. No  pancreatic ductal dilatation or surrounding inflammatory changes. Spleen: Normal in size without focal abnormality. Adrenals/Urinary Tract: Adrenal glands are within normal limits. Kidneys demonstrate a normal enhancement pattern bilaterally. No obstructive changes are seen. The bladder is well distended. Stomach/Bowel: No obstructive or inflammatory changes of the colon are noted. A loop of transverse colon extends into a large ventral hernia. This is stable in appearance from the prior exam. No obstructive changes are seen. The appendix is not visualized consistent with a prior surgical history. Small bowel and stomach are unremarkable. Vascular/Lymphatic: Aortic atherosclerosis. No enlarged abdominal or pelvic lymph nodes. Reproductive: Uterus and bilateral adnexa are unremarkable. Other: Ventral hernia is again seen and stable with a loop of transverse colon within. No free fluid is noted. No abdominopelvic ascites. Musculoskeletal: Degenerative changes of lumbar spine are seen. Review of the MIP images confirms the above findings. IMPRESSION: CTA of the chest: No evidence of pulmonary emboli. Post treatment changes in the right hilum and extending into the right upper lobe similar to that seen on the prior exam. There are some new peripheral airspace opacities identified consistent with acute multifocal infiltrate. CT of the abdomen and pelvis: Cholelithiasis without complicating factors. Large ventral hernia containing transverse colon without obstructive change. Electronically Signed   By: Oneil Devonshire M.D.   On: 12/08/2023 01:48   CT ABDOMEN PELVIS W CONTRAST Result Date: 12/08/2023 CLINICAL DATA:  Acute chest and abdominal pain, history of recent fall, initial encounter EXAM: CT ANGIOGRAPHY CHEST CT ABDOMEN AND PELVIS WITH CONTRAST TECHNIQUE: Multidetector CT imaging of the chest was performed using the standard protocol during bolus administration of intravenous contrast. Multiplanar CT image  reconstructions and MIPs were obtained to evaluate the vascular anatomy. Multidetector CT imaging of the abdomen and pelvis was performed using the standard protocol during bolus administration of intravenous contrast. RADIATION DOSE REDUCTION: This exam was performed according to the departmental dose-optimization program which includes automated exposure control, adjustment of the mA and/or kV according to patient size and/or use of iterative reconstruction technique. CONTRAST:  OMNIPAQUE  IOHEXOL  350 MG/ML SOLN COMPARISON:  10/06/2023 FINDINGS: CTA CHEST FINDINGS Cardiovascular: Atherosclerotic calcifications of the thoracic aorta are noted. No aneurysmal dilatation or dissection is seen. No cardiac enlargement is noted. The pulmonary artery shows a normal branching pattern bilaterally. No filling defect to suggest pulmonary embolism is noted. Mediastinum/Nodes: Thoracic inlet is within normal limits. Small mediastinal nodes are seen near the AP window and along the left pulmonary artery. These were not well visualized on the prior exam. These may be reactive in nature as they are not significant by size criteria. Previously seen right paratracheal nodes are less prominent on today's exam. The esophagus as visualized is within normal limits. Lungs/Pleura: The left lung is well aerated without  focal infiltrate or sizable effusion. Right lung again demonstrates right hilar and upper lobe density similar to that seen on the prior exam consistent with post treatment changes. Some patchy opacities are noted in the right upper lobe as well as in the medial aspect of the right middle lobe and right lower lobe new from the prior exam felt to represent acute infiltrate. Minimal right pleural effusion is noted. No parenchymal nodule is seen. Musculoskeletal: Degenerative changes of the thoracic spine are seen. No compression deformity is seen. Review of the MIP images confirms the above findings. CT ABDOMEN and PELVIS  FINDINGS Hepatobiliary: Liver is within normal limits. Gallbladder is well distended with multiple dependent gallstones stable from the prior exam. Pancreas: Unremarkable. No pancreatic ductal dilatation or surrounding inflammatory changes. Spleen: Normal in size without focal abnormality. Adrenals/Urinary Tract: Adrenal glands are within normal limits. Kidneys demonstrate a normal enhancement pattern bilaterally. No obstructive changes are seen. The bladder is well distended. Stomach/Bowel: No obstructive or inflammatory changes of the colon are noted. A loop of transverse colon extends into a large ventral hernia. This is stable in appearance from the prior exam. No obstructive changes are seen. The appendix is not visualized consistent with a prior surgical history. Small bowel and stomach are unremarkable. Vascular/Lymphatic: Aortic atherosclerosis. No enlarged abdominal or pelvic lymph nodes. Reproductive: Uterus and bilateral adnexa are unremarkable. Other: Ventral hernia is again seen and stable with a loop of transverse colon within. No free fluid is noted. No abdominopelvic ascites. Musculoskeletal: Degenerative changes of lumbar spine are seen. Review of the MIP images confirms the above findings. IMPRESSION: CTA of the chest: No evidence of pulmonary emboli. Post treatment changes in the right hilum and extending into the right upper lobe similar to that seen on the prior exam. There are some new peripheral airspace opacities identified consistent with acute multifocal infiltrate. CT of the abdomen and pelvis: Cholelithiasis without complicating factors. Large ventral hernia containing transverse colon without obstructive change. Electronically Signed   By: Oneil Devonshire M.D.   On: 12/08/2023 01:48   CT HEAD WO CONTRAST ( ) Result Date: 12/08/2023 CLINICAL DATA:  Recent fall with headaches and neck pain, initial encounter EXAM: CT HEAD WITHOUT CONTRAST CT CERVICAL SPINE WITHOUT CONTRAST TECHNIQUE:  Multidetector CT imaging of the head and cervical spine was performed following the standard protocol without intravenous contrast. Multiplanar CT image reconstructions of the cervical spine were also generated. RADIATION DOSE REDUCTION: This exam was performed according to the departmental dose-optimization program which includes automated exposure control, adjustment of the mA and/or kV according to patient size and/or use of iterative reconstruction technique. COMPARISON:  10/03/2023 FINDINGS: CT HEAD FINDINGS Brain: No evidence of acute infarction, hemorrhage, hydrocephalus, extra-axial collection or mass lesion/mass effect. Vascular: No hyperdense vessel or unexpected calcification. Skull: Normal. Negative for fracture or focal lesion. Sinuses/Orbits: No acute finding. Other: None. CT CERVICAL SPINE FINDINGS Alignment: Within normal limits. Skull base and vertebrae: 7 cervical segments are well visualized. Vertebral body height is well maintained. Mild facet hypertrophic changes are seen. Minimal osteophytic changes are seen. No acute fracture or acute facet abnormality is noted. The odontoid is within normal limits. Soft tissues and spinal canal: Surrounding soft tissue structures are within normal limits. Upper chest: Visualized lung apices are unremarkable. Other: None IMPRESSION: CT of the head: No acute intracranial abnormality noted. CT of the cervical spine: Mild degenerative change without acute abnormality. Electronically Signed   By: Oneil Devonshire M.D.   On: 12/08/2023 01:38  CT Cervical Spine Wo Contrast Result Date: 12/08/2023 CLINICAL DATA:  Recent fall with headaches and neck pain, initial encounter EXAM: CT HEAD WITHOUT CONTRAST CT CERVICAL SPINE WITHOUT CONTRAST TECHNIQUE: Multidetector CT imaging of the head and cervical spine was performed following the standard protocol without intravenous contrast. Multiplanar CT image reconstructions of the cervical spine were also generated. RADIATION  DOSE REDUCTION: This exam was performed according to the departmental dose-optimization program which includes automated exposure control, adjustment of the mA and/or kV according to patient size and/or use of iterative reconstruction technique. COMPARISON:  10/03/2023 FINDINGS: CT HEAD FINDINGS Brain: No evidence of acute infarction, hemorrhage, hydrocephalus, extra-axial collection or mass lesion/mass effect. Vascular: No hyperdense vessel or unexpected calcification. Skull: Normal. Negative for fracture or focal lesion. Sinuses/Orbits: No acute finding. Other: None. CT CERVICAL SPINE FINDINGS Alignment: Within normal limits. Skull base and vertebrae: 7 cervical segments are well visualized. Vertebral body height is well maintained. Mild facet hypertrophic changes are seen. Minimal osteophytic changes are seen. No acute fracture or acute facet abnormality is noted. The odontoid is within normal limits. Soft tissues and spinal canal: Surrounding soft tissue structures are within normal limits. Upper chest: Visualized lung apices are unremarkable. Other: None IMPRESSION: CT of the head: No acute intracranial abnormality noted. CT of the cervical spine: Mild degenerative change without acute abnormality. Electronically Signed   By: Oneil Devonshire M.D.   On: 12/08/2023 01:38   ECHOCARDIOGRAM COMPLETE Result Date: 11/17/2023    ECHOCARDIOGRAM REPORT   Patient Name:   Kara [name] Date of Exam: 11/17/2023 Medical Rec #:  [id number]             Height:       63.0 in Accession #:    [id number]            Weight:       187.0 lb Date of Birth:  02/19/1951             BSA:          1.879 m Patient Age:    73 years              BP:           109/68 mmHg Patient Gender: F                     HR:           85 bpm. Exam Location:  [name] Procedure: 2D Echo, Cardiac Doppler, Color Doppler, 3D Echo and Strain Analysis            (Both Spectral and Color Flow Doppler were utilized during            procedure). Indications:      Cancer of upper lobe of right lung C34.11                  Malignant neoplasm of upper lobe of right lung.  History:         Patient has prior history of Echocardiogram examinations, most                  recent 11/19/2016. CHF; Risk Factors:Diabetes. Pulmonary                  embolism.  Sonographer:     Christopher Furnace Referring Phys:  [id number] [facility] C RAO Diagnosing Phys: Cara JONETTA Lovelace MD  Sonographer Comments: Global longitudinal strain was attempted. IMPRESSIONS  1. Left ventricular ejection  fraction, by estimation, is 55 to 60%. The left ventricle has normal function. The left ventricle has no regional wall motion abnormalities. Left ventricular diastolic parameters are consistent with Grade I diastolic dysfunction (impaired relaxation). The average left ventricular global longitudinal strain is 8.6 %. The global longitudinal strain is abnormal.  2. Right ventricular systolic function is normal. The right ventricular size is normal.  3. The mitral valve is normal in structure. No evidence of mitral valve regurgitation.  4. The aortic valve is grossly normal. Aortic valve regurgitation is not visualized. Aortic valve sclerosis/calcification is present, without any evidence of aortic stenosis. FINDINGS  Left Ventricle: Left ventricular ejection fraction, by estimation, is 55 to 60%. The left ventricle has normal function. The left ventricle has no regional wall motion abnormalities. The average left ventricular global longitudinal strain is 8.6 %. Strain was performed and the global longitudinal strain is abnormal. The left ventricular internal cavity size was normal in size. There is no left ventricular hypertrophy. Left ventricular diastolic parameters are consistent with Grade I diastolic dysfunction (impaired relaxation). Right Ventricle: The right ventricular size is normal. No increase in right ventricular wall thickness. Right ventricular systolic function is normal. Left Atrium: Left atrial size was  normal in size. Right Atrium: Right atrial size was normal in size. Pericardium: There is no evidence of pericardial effusion. Mitral Valve: The mitral valve is normal in structure. There is mild thickening of the mitral valve leaflet(s). There is mild calcification of the mitral valve leaflet(s). Normal mobility of the mitral valve leaflets. No evidence of mitral valve regurgitation. MV peak gradient, 9.7 mmHg. The mean mitral valve gradient is 4.0 mmHg. Tricuspid Valve: The tricuspid valve is normal in structure. Tricuspid valve regurgitation is mild. Aortic Valve: The aortic valve is grossly normal. Aortic valve regurgitation is not visualized. Aortic valve sclerosis/calcification is present, without any evidence of aortic stenosis. Aortic valve mean gradient measures 3.0 mmHg. Aortic valve peak gradient measures 5.8 mmHg. Aortic valve area, by VTI measures 2.57 cm. Pulmonic Valve: The pulmonic valve was normal in structure. Pulmonic valve regurgitation is not visualized. Aorta: The ascending aorta was not well visualized. IAS/Shunts: No atrial level shunt detected by color flow Doppler. Additional Comments: 3D was performed not requiring image post processing on an independent workstation and was indeterminate.  LEFT VENTRICLE PLAX 2D LVIDd:         3.70 cm   Diastology LVIDs:         2.60 cm   LV e' medial:    5.22 cm/s LV PW:         0.90 cm   LV E/e' medial:  17.8 LV IVS:        0.90 cm   LV e' lateral:   6.74 cm/s LVOT diam:     2.10 cm   LV E/e' lateral: 13.8 LV SV:         56 LV SV Index:   30        2D Longitudinal Strain LVOT Area:     3.46 cm  2D Strain GLS (A4C):   14.1 %                          2D Strain GLS (A3C):   5.9 %                          2D Strain GLS (A2C):   5.8 %  2D Strain GLS Avg:     8.6 % RIGHT VENTRICLE RV Basal diam:  2.80 cm RV Mid diam:    2.30 cm LEFT ATRIUM             Index        RIGHT ATRIUM          Index LA diam:        2.30 cm 1.22 cm/m   RA  Area:     7.61 cm LA Vol (A2C):   15.0 ml 7.98 ml/m   RA Volume:   10.50 ml 5.59 ml/m LA Vol (A4C):   21.9 ml 11.65 ml/m LA Biplane Vol: 18.2 ml 9.69 ml/m  AORTIC VALVE AV Area (Vmax):    2.37 cm AV Area (Vmean):   2.38 cm AV Area (VTI):     2.57 cm AV Vmax:           120.00 cm/s AV Vmean:          84.700 cm/s AV VTI:            0.217 m AV Peak Grad:      5.8 mmHg AV Mean Grad:      3.0 mmHg LVOT Vmax:         82.20 cm/s LVOT Vmean:        58.100 cm/s LVOT VTI:          0.161 m LVOT/AV VTI ratio: 0.74  AORTA Ao Root diam: 2.70 cm MITRAL VALVE                TRICUSPID VALVE MV Area (PHT): 4.46 cm     TR Peak grad:   16.6 mmHg MV Area VTI:   2.22 cm     TR Vmax:        204.00 cm/s MV Peak grad:  9.7 mmHg MV Mean grad:  4.0 mmHg     SHUNTS MV Vmax:       1.56 m/s     Systemic VTI:  0.16 m MV Vmean:      91.1 cm/s    Systemic Diam: 2.10 cm MV Decel Time: 170 msec MV E velocity: 92.80 cm/s MV A velocity: 140.00 cm/s MV E/A ratio:  0.66 Dwayne D Callwood MD Electronically signed by Cara JONETTA Lovelace MD Signature Date/Time: 11/17/2023/2:22:07 PM    Final    IR Removal Tun Access W/ Port W/O FL Result Date: 11/16/2023 INDICATION: 73 year old with history of lung cancer. Port-A-Cath is no longer needed. EXAM: REMOVAL OF RIGHT CHEST PORT-A-CATH MEDICATIONS: 1% lidocaine  with epinephrine  ANESTHESIA/SEDATION: No sedation FLUOROSCOPY: None COMPLICATIONS: None immediate. PROCEDURE: Informed written consent was obtained from the patient after a thorough discussion of the procedural risks, benefits and alternatives. All questions were addressed. Maximal Sterile Barrier Technique was utilized including caps, mask, sterile gowns, sterile gloves, sterile drape, hand hygiene and skin antiseptic. A timeout was performed prior to the initiation of the procedure. The right chest was prepped and draped in a sterile fashion. 1% lidocaine  with epinephrine  was utilized for local anesthesia. An incision was made over the previously  healed surgical incision. Utilizing blunt dissection, the port catheter and reservoir were removed from the underlying subcutaneous tissue in their entirety. The pocket was irrigated with a copious amount of sterile normal saline. The subcutaneous tissue was closed with 3-0 Vicryl interrupted subcutaneous stitches. A 4-0 Vicryl running subcuticular stitch was utilized to approximate the skin. Dermabond was applied. IMPRESSION: Successful removal of right chest Port-A-Cath. Electronically Signed  By: Juliene Balder M.D.   On: 11/16/2023 14:56     Assessment and Plan: * Hypoxic respiratory failure [facility]) Patient recently admitted for same with septic pneumonia. She was sent home on Levaquin  which she was taking. IV Rocephin  and p.o. azithromycin  To my eye the hilar mass on her chest x-ray appears larger than the last chest x-ray she had done.  This could be contributing to her worsening hypoxia. Could have some postobstructive pneumonia as well. Patient also with some wheezing on exam Will give albuterol  nebs and see if this improves her breathing Given an acute DVT despite her anticoagulation which is in therapeutic range will check another CT angio though it was -2 days ago to ensure that she does not have an acute PE  Chronic hip pain (Right) Continue her home pain regimen  Spondylosis without myelopathy or radiculopathy, lumbosacral region On gabapentin  On chronic opioids  Diabetic peripheral neuropathy (HCC) On gabapentin   Recurrent non-small cell lung cancer (NSCLC) (HCC) On Tagrisso  daily Consider oncology consultation Consider palliative consultation as well  Uncontrolled type 2 diabetes mellitus with hyperglycemia, without long-term current use of insulin  (HCC) On Trulicity q. Saturday Glipizide  Sliding scale insulin   Chronic anticoagulation (COUMADIN ) New DVT despite full-strength anticoagulation History of prior IVC implants but she had this removed.  Long term current use  of opiate analgesic Continue home pain regimen  Chemotherapy induced neutropenia (HCC) Platelet count is stable, WBC is stable as well and low. She is mildly anemic  Chronic diastolic (congestive) heart failure (HCC) Normal BNP and no evidence of volume overload at present.  Acute deep vein thrombosis (DVT) of distal end of right lower extremity (HCC) Continue Coumadin   Benign essential hypertension Continue ARB      Advance Care Planning:   Code Status: Full Code full, confirmed with patient  Consults: None  Family Communication: Patient at bedside  Severity of Illness: The appropriate patient status for this patient is INPATIENT. Inpatient status is judged to be reasonable and necessary in order to provide the required intensity of service to ensure the patient's safety. The patient's presenting symptoms, physical exam findings, and initial radiographic and laboratory data in the context of their chronic comorbidities is felt to place them at high risk for further clinical deterioration. Furthermore, it is not anticipated that the patient will be medically stable for discharge from the hospital within 2 midnights of admission.   * I certify that at the point of admission it is my clinical judgment that the patient will require inpatient hospital care spanning beyond 2 midnights from the point of admission due to high intensity of service, high risk for further deterioration and high frequency of surveillance required.*  Author: Glenys GORMAN Birk, MD 12/11/2023 11:33 PM  For on call review [email or web address].

## 2023-12-11 NOTE — Assessment & Plan Note (Signed)
--  Continue ARB 

## 2023-12-11 NOTE — Assessment & Plan Note (Signed)
Continue home pain regimen °

## 2023-12-11 NOTE — Hospital Course (Signed)
 Patient is a 73 year old with history of T2DM, HTN, history of PE, non-small cell lung cancer with recurrence who was recently admitted with sepsis related to pneumonia.  She was discharged home approximately 2 days ago on Levaquin  and was taking this as directed.  Additionally her lung cancer has recurred and she is on adjuvant Tagrisso  daily.  She returns with worsening shortness of breath.  In the ED she was noted to be tachypneic and hypoxic requiring 2 L of nasal cannula to maintain her sats.  Her labs were fairly stable and unremarkable including her lactic acid.  Her INR was elevated at 2.9 but she is on chronic Coumadin .  Chest x-ray showed a right hilar mass.  Workup also revealed a new right DVT.  Patient reports feeling like she is wheezing as well. Patient reports nonproductive cough for the last 4 days.

## 2023-12-11 NOTE — Assessment & Plan Note (Signed)
 Continue her home pain regimen

## 2023-12-11 NOTE — ED Provider Notes (Signed)
 [facility] Provider Note    Event Date/Time   First MD Initiated Contact with Patient 12/11/23 2107     (approximate)   History   Chest Pain   HPI  Kara [name] is a 73 y.o. adult  who returns 2 days after discharge with worsening shortness of breath chest pain found to be hypoxic.  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  She was recently admitted for sepsis, pneumonia and UTI and discharged 2 days ago on Levaquin .  She is not on oxygen at baseline.  She reports that for the past day she has had worsening shortness of breath and chest tightness with any sort of exertion.  She does not use any oxygen at baseline.  Per EMS she required 3 L because she was hypoxic to the low 80s on arrival.  Denies any abdominal pain.  Reports compliance with her medications     Physical Exam   Triage Vital Signs: ED Triage Vitals  Encounter Vitals Group     BP 12/11/23 2102 (!) 121/53     Girls Systolic BP Percentile --      Girls Diastolic BP Percentile --      Boys Systolic BP Percentile --      Boys Diastolic BP Percentile --      Pulse Rate 12/11/23 2100 99     Resp 12/11/23 2100 16     Temp 12/11/23 2100 98.3 F (36.8 C)     Temp Source 12/11/23 2100 Oral     SpO2 12/11/23 2100 92 %     Weight --      Height --      Head Circumference --      Peak Flow --      Pain Score 12/11/23 2058 8     Pain Loc --      Pain Education --      Exclude from Growth Chart --     Most recent vital signs: Vitals:   12/11/23 2100 12/11/23 2102  BP:  (!) 121/53  Pulse: 99   Resp: 16   Temp: 98.3 F (36.8 C)   SpO2: 92%     Nursing Triage Note reviewed. Vital signs reviewed and patients oxygen saturation is hypoxic General: Patient is well nourished, well developed, awake and alert,  appears unwell Head: Normocephalic and atraumatic Eyes: Normal inspection, extraocular muscles intact, no conjunctival pallor Ear, nose, throat: Normal external exam Neck: Normal range of motion Respiratory: Patient is in respiratory distress, lungs with rhonchi Cardiovascular: Patient is borderline tachycardic, RR  GI: Abd SNT with no guarding or rebound  Back: Normal inspection of the back with good strength and range of motion throughout all ext Extremities: pulses intact with good cap refills, no LE pitting edema or calf tenderness Neuro: The patient is alert and oriented to person, place, and time, appropriately conversive, with 5/5 bilat UE/LE strength, no gross motor or sensory defects noted. Coordination appears to be adequate. Skin: Warm, dry, and intact Psych: normal mood and affect, no SI or HI  ED Results / Procedures / Treatments   Labs (all labs ordered are listed, but only abnormal results are displayed) Labs Reviewed  CBC WITH DIFFERENTIAL/PLATELET - Abnormal; Notable for the following components:      Result Value   WBC 3.8 (*)  RBC 3.75 (*)    Hemoglobin 11.2 (*)    HCT 33.8 (*)    Platelets 80 (*)    Lymphs Abs 0.6 (*)    All other components within normal limits  COMPREHENSIVE METABOLIC PANEL WITH GFR - Abnormal; Notable for the following components:   Glucose, Bld 144 (*)    Calcium  8.5 (*)    Albumin 2.8 (*)    All other components within normal limits  PROTIME-INR - Abnormal; Notable for the following components:   Prothrombin  Time 31.9 (*)    INR 2.9 (*)    All other components within normal limits  CULTURE, BLOOD (ROUTINE X 2)  CULTURE, BLOOD (ROUTINE X 2)  LIPASE, BLOOD  BRAIN NATRIURETIC PEPTIDE  PROCALCITONIN  LACTIC ACID, PLASMA  LACTIC ACID, PLASMA  BLOOD GAS, VENOUS  URINALYSIS, W/ REFLEX TO CULTURE (INFECTION SUSPECTED)  TROPONIN I (HIGH SENSITIVITY)     EKG EKG and rhythm strip are interpreted by myself:   EKG: tachycardic sinus  rhythm] at heart rate of 100, normal QRS duration, QTc 489, normal or nonspecific ST segments and T waves no ectopy EKG not consistent with Acute STEMI Rhythm strip: tachycardic sinus rhythm in lead II   RADIOLOGY XR chest: Has continued opacities unclear whether secondary to malignancy or infection US  doppler: Right DVT  PROCEDURES:  Critical Care performed: No  Procedures   MEDICATIONS ORDERED IN ED: Medications - No data to display   IMPRESSION / MDM / ASSESSMENT AND PLAN / ED COURSE                                Differential diagnosis includes, but is not limited to, worsening lung malignancy, worsening pneumonia, sepsis, CHF, electrolyte derangement  ED course: Patient arrives with oxygen and is satting 92%.  Pulmonary exam consistent with rhonchi but no bronchospasm so we will hold off on DuoNebs and Solu-Medrol  at this time.  She is not febrile here, she is not neutropenic and she has no profound leukocytosis.  I did consider antibiotics, but she has no elevated inflammatory markers and did take her Levaquin  today.  Unfortunately she will require readmission today as she is not on oxygen at baseline.  We discussed case with hospitalist   Clinical Course as of 12/11/23 January 10, 2230  Sun Dec 11, 2023  2135 WBC(!): 3.8 No leukocytosis [HD]  2134/01/10 Hemoglobin(!): 11.2 No acute anemia [HD]  10-Jan-2150 INR(!): 2.9 Therapeutic [HD]  January 10, 2229 Patient found to have DVT in right lower extremity.  She already has an IVC filter and is therapeutic on her Coumadin  [HD]    Clinical Course User Index [HD] Nicholaus Rolland BRAVO, MD   Data(2/3 categories following were performed): I reviewed or ordered at least three unique tests, external notes, and/or the history required an independent historian as one of the three requirements as following: At least 3 labs/imaging studies were obtained and/or reviewed. AND  I discussed the management of the patient with the following external physician or qualified  healthcare provider: Admitting physician  Risk: This patient has a high risk of morbidity due to further diagnostic testing or treatment. Rationale: Decision made regarding admission  Admit Level 5 - Labs/Rads, Admit, Consult:  Suggested E/M Coding Level: 5, 99285  This level has been selected based on the 2022-01-10 CPT guidelines for E/M codes in the Emergency Department based on 2/3 of the CoPA, Data, and Risk.    FINAL CLINICAL IMPRESSION(S) /  ED DIAGNOSES   Final diagnoses:  Hypoxia  Malignant neoplasm of lung, unspecified laterality, unspecified part of lung (HCC)  Pneumonia due to infectious organism, unspecified laterality, unspecified part of lung  Acute deep vein thrombosis (DVT) of proximal vein of lower extremity, unspecified laterality (HCC)     Rx / DC Orders   ED Discharge Orders     None        Note:  This document was prepared using Dragon voice recognition software and may include unintentional dictation errors.   Nicholaus Rolland BRAVO, MD 12/11/23 2231

## 2023-12-11 NOTE — Assessment & Plan Note (Signed)
 Platelet count is stable, WBC is stable as well and low. She is mildly anemic

## 2023-12-11 NOTE — Assessment & Plan Note (Signed)
 New DVT despite full-strength anticoagulation History of prior IVC implants but she had this removed.

## 2023-12-11 NOTE — Assessment & Plan Note (Signed)
 Normal BNP and no evidence of volume overload at present.

## 2023-12-11 NOTE — Assessment & Plan Note (Signed)
 On gabapentin  On chronic opioids

## 2023-12-11 NOTE — Assessment & Plan Note (Addendum)
 Patient recently admitted for same with septic pneumonia. She was sent home on Levaquin  which she was taking. IV Rocephin  and p.o. azithromycin  To my eye the hilar mass on her chest x-ray appears larger than the last chest x-ray she had done.  This could be contributing to her worsening hypoxia. Could have some postobstructive pneumonia as well. Patient also with some wheezing on exam Will give albuterol  nebs and see if this improves her breathing Given an acute DVT despite her anticoagulation which is in therapeutic range will check another CT angio though it was -2 days ago to ensure that she does not have an acute PE

## 2023-12-11 NOTE — Assessment & Plan Note (Signed)
Continue Coumadin. 

## 2023-12-11 NOTE — Assessment & Plan Note (Addendum)
 On Tagrisso  daily Consider oncology consultation Consider palliative consultation as well

## 2023-12-11 NOTE — Assessment & Plan Note (Signed)
 On Trulicity q. Saturday Glipizide  Sliding scale insulin 

## 2023-12-12 ENCOUNTER — Encounter: Payer: Self-pay | Admitting: Oncology

## 2023-12-12 ENCOUNTER — Other Ambulatory Visit: Payer: Self-pay

## 2023-12-12 ENCOUNTER — Telehealth (HOSPITAL_COMMUNITY): Payer: Self-pay | Admitting: Pharmacy Technician

## 2023-12-12 ENCOUNTER — Encounter: Payer: Self-pay | Admitting: Internal Medicine

## 2023-12-12 ENCOUNTER — Other Ambulatory Visit (HOSPITAL_COMMUNITY): Payer: Self-pay

## 2023-12-12 DIAGNOSIS — J9601 Acute respiratory failure with hypoxia: Secondary | ICD-10-CM | POA: Diagnosis not present

## 2023-12-12 LAB — CBC
HCT: 32.9 % — ABNORMAL LOW (ref 36.0–46.0)
Hemoglobin: 10.6 g/dL — ABNORMAL LOW (ref 12.0–15.0)
MCH: 30.3 pg (ref 26.0–34.0)
MCHC: 32.2 g/dL (ref 30.0–36.0)
MCV: 94 fL (ref 80.0–100.0)
Platelets: 84 K/uL — ABNORMAL LOW (ref 150–400)
RBC: 3.5 MIL/uL — ABNORMAL LOW (ref 3.87–5.11)
RDW: 13.3 % (ref 11.5–15.5)
WBC: 4 K/uL (ref 4.0–10.5)
nRBC: 0 % (ref 0.0–0.2)

## 2023-12-12 LAB — BASIC METABOLIC PANEL WITH GFR
Anion gap: 11 (ref 5–15)
BUN: 11 mg/dL (ref 8–23)
CO2: 24 mmol/L (ref 22–32)
Calcium: 8.4 mg/dL — ABNORMAL LOW (ref 8.9–10.3)
Chloride: 103 mmol/L (ref 98–111)
Creatinine, Ser: 0.74 mg/dL (ref 0.44–1.00)
GFR, Estimated: >60 mL/min (ref >60)
Glucose, Bld: 99 mg/dL (ref 70–99)
Potassium: 3.2 mmol/L — ABNORMAL LOW (ref 3.5–5.1)
Sodium: 138 mmol/L (ref 135–145)

## 2023-12-12 LAB — URINALYSIS, W/ REFLEX TO CULTURE (INFECTION SUSPECTED)
Bacteria, UA: NONE SEEN
Bilirubin Urine: NEGATIVE
Glucose, UA: NEGATIVE mg/dL
Hgb urine dipstick: NEGATIVE
Ketones, ur: 20 mg/dL — AB
Leukocytes,Ua: NEGATIVE
Nitrite: NEGATIVE
Protein, ur: NEGATIVE mg/dL
Specific Gravity, Urine: >1.046 — ABNORMAL HIGH (ref 1.005–1.030)
pH: 5 (ref 5.0–8.0)

## 2023-12-12 LAB — CULTURE, BLOOD (ROUTINE X 2)
Culture: NO GROWTH
Culture: NO GROWTH
Special Requests: ADEQUATE

## 2023-12-12 LAB — PROTIME-INR
INR: 3 — ABNORMAL HIGH (ref 0.8–1.2)
Prothrombin Time: 32.6 s — ABNORMAL HIGH (ref 11.4–15.2)

## 2023-12-12 LAB — GLUCOSE, CAPILLARY
Glucose-Capillary: 137 mg/dL — ABNORMAL HIGH (ref 70–99)
Glucose-Capillary: 158 mg/dL — ABNORMAL HIGH (ref 70–99)

## 2023-12-12 LAB — CBG MONITORING, ED
Glucose-Capillary: 91 mg/dL (ref 70–99)
Glucose-Capillary: 98 mg/dL (ref 70–99)
Glucose-Capillary: 122 mg/dL — ABNORMAL HIGH (ref 70–99)

## 2023-12-12 LAB — PROCALCITONIN: Procalcitonin: 5.18 ng/mL

## 2023-12-12 MED ORDER — WARFARIN - PHYSICIAN DOSING INPATIENT
Freq: Every day | Status: DC
  Filled 2023-12-12: qty 1

## 2023-12-12 MED ORDER — ENSURE PLUS HIGH PROTEIN PO LIQD
237.0000 mL | Freq: Two times a day (BID) | ORAL | Status: DC
  Administered 2023-12-13 – 2023-12-20 (×6): 237 mL via ORAL

## 2023-12-12 MED ORDER — ACETAMINOPHEN 500 MG PO TABS
1000.0000 mg | ORAL_TABLET | Freq: Once | ORAL | Status: AC
  Administered 2023-12-12: 1000 mg via ORAL
  Filled 2023-12-12: qty 2

## 2023-12-12 MED ORDER — WARFARIN - PHARMACIST DOSING INPATIENT
Freq: Every day | Status: DC
  Filled 2023-12-12: qty 1

## 2023-12-12 MED ORDER — WARFARIN 0.5 MG HALF TABLET
1.5000 mg | ORAL_TABLET | Freq: Once | ORAL | Status: AC
  Administered 2023-12-12: 1.5 mg via ORAL
  Filled 2023-12-12: qty 3

## 2023-12-12 MED ORDER — VITAMIN B-12 1000 MCG PO TABS
5000.0000 ug | ORAL_TABLET | Freq: Every day | ORAL | Status: DC
  Administered 2023-12-12 – 2023-12-20 (×8): 5000 ug via ORAL
  Filled 2023-12-12 (×6): qty 5
  Filled 2023-12-12: qty 10
  Filled 2023-12-12: qty 5

## 2023-12-12 NOTE — ED Notes (Signed)
 Pt to rm 32 via wheelchair. Pt is a one person assist to get in and out of bed. Pt hooked up to cardiac monitor. Pt has belongings at bedside. Both side rails up and bed locked in lowest position and call light within reach. No further needs at this time.

## 2023-12-12 NOTE — Consult Note (Addendum)
 PHARMACY - ANTICOAGULATION CONSULT NOTE  Pharmacy Consult for warfarin management Indication: DVT  Allergies  Allergen Reactions   Adhesive [Tape] Other (See Comments)    Pt reports, when removing tape from skin most tapes pull her skin off. Ok to use paper tape   Avapro  [Irbesartan ] Anaphylaxis    Facial swelling   Morphine  And Codeine  Nausea And Vomiting   Other Hives    Chlorhexadine wipes/CHG wipes,    Latex Rash    Exam gloves/ does not react around elastic and lips don't swell when blowing balloons    Patient Measurements: Height: 5' 3 (160 cm) IBW/kg (Calculated) : 52.4  Vital Signs: Temp: 98.1 F (36.7 C) (07/21 0403) Temp Source: Oral (07/20 2100) BP: 109/64 (07/21 0700) Pulse Rate: 98 (07/21 0700)  Labs: Recent Labs    12/11/23 2106 12/11/23 2310 12/12/23 0425  HGB 11.2*  --  10.6*  HCT 33.8*  --  32.9*  PLT 80*  --  84*  LABPROT 31.9*  --   --   INR 2.9*  --   --   CREATININE 0.74  --  0.74  TROPONINIHS 13 12  --     Estimated Creatinine Clearance: 65.6 mL/min (by C-G formula based on SCr of 0.74 mg/dL).   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, 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   CHF (congestive heart failure) (HCC)    followed colon resection, wouldn't let me get up   Chronic anticoagulation    Degenerative disc disease, lumbar    Diverticulitis 2018   Diverticulosis    DVT of lower extremity, bilateral (HCC) 2018   Essential hypertension    History of being hospitalized    1 month ago for 3 days, vomiting, and kink in upper intestine   History of hiatal hernia    Mediastinal adenopathy 04/2023   MRSA nasal colonization    Non-small cell lung cancer (HCC) 2022   right   Obesity (BMI 30-39.9)    Osteoarthritis    Osteoporosis    PE (pulmonary thromboembolism) (HCC) 2018   Pneumonia    Small bowel obstruction (HCC) 03/20/2023   Status post Hartmann's  procedure (HCC)    Type 2 diabetes mellitus with obesity (HCC)    Vertigo     Medications:  Currently on Warfarin 5 mg po daily  Assessment: 73 yo presented to ED with complaint of increased shortness of breath.  Was recently hospitalized 7/16-7/18 with pneumonia. During workup patient was also found to have new acute DVT despite being on warfarin.  Pharmacy consulted to manage Warfarin.  Interacting medication: Azithromycin   Goal of Therapy:  INR 2-3 Monitor platelets by anticoagulation protocol: Yes    Date INR Warfarin Dose  7/20 2.9 5 mg (PTA)  7/21 3.0 1.5 mg     Plan:  INR is therapeutic at 3.0 Since INR is in upper limit of range and patient on azithromycin  will reduce dose today to 1.5 mg po x 1 Daily INR while admitted and CBC at least every 72 hours  Kara [name] Kara [name] Kara [name], PharmD 12/12/2023,8:07 AM

## 2023-12-12 NOTE — Progress Notes (Signed)
  PROGRESS NOTE    Kara [name]  FMW:[id number] DOB: [birth year] DOA: 12/11/2023 PCP: Sadie Manna, MD  127A/127A-AA  LOS: 1 day   Brief hospital course:   Assessment & Plan: Kara [name] is a 73 y.o. adult with medical history significant of T2DM, HTN, history of PE, non-small cell lung cancer with recurrence who was recently admitted with sepsis related to pneumonia.  She was discharged home approximately 2 days ago on Levaquin  and was taking this as directed.  Additionally her lung cancer has recurred and she is on adjuvant Tagrisso  daily.  She returns with worsening shortness of breath.   Patient recently admitted for same with septic pneumonia.  She was sent home on Levaquin  which she was taking.   * Acute Hypoxic respiratory failure (HCC) --vitals showed intermittent O2 desat to 88% while on 2L O2.  CTA neg PE, and Interval worsening of peribronchovascular patchy ground-glass and consolidative opacities most prominent within the right upper lobe. Finding may represent infection/inflammation versus alveolar hemorrhage. --pt denied sputum production or hemoptysis.   --started on IV Rocephin  and p.o. azithromycin  --cont ceftriaxone  and azithro --Continue supplemental O2 to keep sats >=90%, wean as tolerated  Chronic hip pain (Right) Spondylosis without myelopathy or radiculopathy, lumbosacral region Chronic opioids use --cont home Norco and gabapentin   Diabetic peripheral neuropathy (HCC) --cont gabapentin   Recurrent non-small cell lung cancer (NSCLC) (HCC) --cont Targrisso  type 2 diabetes mellitus with hyperglycemia, without long-term current use of insulin  (HCC) --A1c 7.2. --hold home glipizide    Hx of PE on Chronic anticoagulation (COUMADIN )  Acute right DVT New DVT despite full-strength anticoagulation.  History of prior IVC implants but she had this removed.  Unclear whether pt failed warfarin or her INR was not stable. --cont warfarin for  now.  Consider changing to DOAC.  Chemotherapy induced neutropenia (HCC) Platelet count is stable, WBC is stable as well and low. She is mildly anemic  Chronic diastolic (congestive) heart failure (HCC) Normal BNP and no evidence of volume overload at present.  Benign essential hypertension --cont irbesartan    DVT prophylaxis: On:Coumadin  Code Status: Full code  Family Communication:  Level of care: Med-Surg Dispo:   The patient is from: home Anticipated d/c is to: home Anticipated d/c date is: 2-3 days   Subjective and Interval History:  Pt reported worsening dry cough.   Objective: Vitals:   12/12/23 1230 12/12/23 1301 12/12/23 1414 12/12/23 1445  BP: 106/60 114/61  (!) 129/58  Pulse: 94 93  (!) 101  Resp: (!) 34 20  20  Temp: 97.9 F (36.6 C) 99.1 F (37.3 C)  99.3 F (37.4 C)  TempSrc: Oral     SpO2: 94% 92% (!) 89% 94%  Height:       No intake or output data in the 24 hours ending 12/12/23 1550 There were no vitals filed for this visit.  Examination:   Constitutional: NAD, AAOx3 HEENT: conjunctivae and lids normal, EOMI CV: No cyanosis.   RESP: normal respiratory effort, no wheezing, on 2L SKIN: warm, dry, extensive bruising on left arm Neuro: II - XII grossly intact.   Psych: Normal mood and affect.  Appropriate judgement and reason   Data Reviewed: I have personally reviewed labs and imaging studies  Time spent: 50 minutes  [name], MD [facility] If 7PM-7AM, please contact night-coverage 12/12/2023, 3:50 PM

## 2023-12-12 NOTE — Telephone Encounter (Signed)
 Pharmacy Patient Advocate Encounter  Insurance verification completed.    The patient is insured through [employer]/ [employer]. Patient has Medicare and is not eligible for a copay card, but may be able to apply for patient assistance or Medicare RX Payment Plan (Patient Must reach out to their plan, if eligible for payment plan), if available.    Ran test claim for Xarelto  Starter Pack and the current 30 day co-pay is $47.00.  Ran test claim for Eliquis  Starter Pack and the current 30 day co-pay is $47.00.   This test claim was processed through [facility]- copay amounts may vary at other pharmacies due to pharmacy/plan contracts, or as the patient moves through the different stages of their insurance plan.

## 2023-12-12 NOTE — ED Notes (Signed)
Report received from Taylor RN

## 2023-12-12 NOTE — ED Notes (Signed)
 CCMD notified pt placed on cardiac monitor.

## 2023-12-12 NOTE — Plan of Care (Signed)
   Problem: Education: Goal: Ability to describe self-care measures that may prevent or decrease complications (Diabetes Survival Skills Education) will improve Outcome: Progressing

## 2023-12-12 NOTE — ED Notes (Signed)
 Called lab to draw timed INR at this time

## 2023-12-13 DIAGNOSIS — J9601 Acute respiratory failure with hypoxia: Secondary | ICD-10-CM | POA: Diagnosis not present

## 2023-12-13 LAB — GLUCOSE, CAPILLARY
Glucose-Capillary: 154 mg/dL — ABNORMAL HIGH (ref 70–99)
Glucose-Capillary: 180 mg/dL — ABNORMAL HIGH (ref 70–99)
Glucose-Capillary: 207 mg/dL — ABNORMAL HIGH (ref 70–99)
Glucose-Capillary: 126 mg/dL — ABNORMAL HIGH (ref 70–99)

## 2023-12-13 LAB — URINE CULTURE: Culture: NO GROWTH

## 2023-12-13 LAB — CBC
HCT: 32.7 % — ABNORMAL LOW (ref 36.0–46.0)
Hemoglobin: 10.7 g/dL — ABNORMAL LOW (ref 12.0–15.0)
MCH: 30.2 pg (ref 26.0–34.0)
MCHC: 32.7 g/dL (ref 30.0–36.0)
MCV: 92.4 fL (ref 80.0–100.0)
Platelets: 87 K/uL — ABNORMAL LOW (ref 150–400)
RBC: 3.54 MIL/uL — ABNORMAL LOW (ref 3.87–5.11)
RDW: 13.5 % (ref 11.5–15.5)
WBC: 5 K/uL (ref 4.0–10.5)
nRBC: 0 % (ref 0.0–0.2)

## 2023-12-13 LAB — BASIC METABOLIC PANEL WITH GFR
Anion gap: 9 (ref 5–15)
BUN: 10 mg/dL (ref 8–23)
CO2: 25 mmol/L (ref 22–32)
Calcium: 8.2 mg/dL — ABNORMAL LOW (ref 8.9–10.3)
Chloride: 105 mmol/L (ref 98–111)
Creatinine, Ser: 0.64 mg/dL (ref 0.44–1.00)
GFR, Estimated: >60 mL/min (ref >60)
Glucose, Bld: 131 mg/dL — ABNORMAL HIGH (ref 70–99)
Potassium: 3.9 mmol/L (ref 3.5–5.1)
Sodium: 139 mmol/L (ref 135–145)

## 2023-12-13 LAB — PROTIME-INR
INR: 2.6 — ABNORMAL HIGH (ref 0.8–1.2)
Prothrombin Time: 29.5 s — ABNORMAL HIGH (ref 11.4–15.2)

## 2023-12-13 LAB — MAGNESIUM: Magnesium: 2.1 mg/dL (ref 1.7–2.4)

## 2023-12-13 MED ORDER — WARFARIN SODIUM 2.5 MG PO TABS
2.5000 mg | ORAL_TABLET | Freq: Once | ORAL | Status: AC
  Administered 2023-12-13: 2.5 mg via ORAL
  Filled 2023-12-13: qty 1

## 2023-12-13 MED ORDER — HYDROCODONE BIT-HOMATROP MBR 5-1.5 MG/5ML PO SOLN
5.0000 mL | Freq: Four times a day (QID) | ORAL | Status: DC | PRN
  Administered 2023-12-13 – 2023-12-14 (×3): 5 mL via ORAL
  Filled 2023-12-13 (×3): qty 5

## 2023-12-13 MED ORDER — GUAIFENESIN-DM 100-10 MG/5ML PO SYRP: 10.0000 mL | ORAL_SOLUTION | Freq: Four times a day (QID) | ORAL | Status: DC | PRN

## 2023-12-13 NOTE — Plan of Care (Signed)
  Problem: Education: Goal: Ability to describe self-care measures that may prevent or decrease complications (Diabetes Survival Skills Education) will improve Outcome: Progressing   Problem: Coping: Goal: Ability to adjust to condition or change in health will improve Outcome: Progressing   Problem: Fluid Volume: Goal: Ability to maintain a balanced intake and output will improve Outcome: Progressing   Problem: Health Behavior/Discharge Planning: Goal: Ability to manage health-related needs will improve Outcome: Progressing   Problem: Metabolic: Goal: Ability to maintain appropriate glucose levels will improve Outcome: Progressing   Problem: Skin Integrity: Goal: Risk for impaired skin integrity will decrease Outcome: Progressing

## 2023-12-13 NOTE — Consult Note (Signed)
 PHARMACY - ANTICOAGULATION CONSULT NOTE  Pharmacy Consult for warfarin management Indication: DVT  Allergies  Allergen Reactions   Adhesive [Tape] Other (See Comments)    Pt reports, when removing tape from skin most tapes pull her skin off. Ok to use paper tape   Avapro  [Irbesartan ] Anaphylaxis    Facial swelling   Morphine  And Codeine  Nausea And Vomiting   Other Hives    Chlorhexadine wipes/CHG wipes,    Latex Rash    Exam gloves/ does not react around elastic and lips don't swell when blowing balloons    Patient Measurements: Height: 5' 3 (160 cm) IBW/kg (Calculated) : 52.4  Vital Signs: Temp: 98.4 F (36.9 C) (07/22 0529) BP: 105/49 (07/22 0529) Pulse Rate: 100 (07/22 0529)  Labs: Recent Labs    12/11/23 2106 12/11/23 2310 12/12/23 0425 12/12/23 0726 12/13/23 0327  HGB 11.2*  --  10.6*  --  10.7*  HCT 33.8*  --  32.9*  --  32.7*  PLT 80*  --  84*  --  87*  LABPROT 31.9*  --   --  32.6* 29.5*  INR 2.9*  --   --  3.0* 2.6*  CREATININE 0.74  --  0.74  --  0.64  TROPONINIHS 13 12  --   --   --     Estimated Creatinine Clearance: 65.6 mL/min (by C-G formula based on SCr of 0.64 mg/dL).   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, 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   CHF (congestive heart failure) (HCC)    followed colon resection, wouldn't let me get up   Chronic anticoagulation    Degenerative disc disease, lumbar    Diverticulitis 2018   Diverticulosis    DVT of lower extremity, bilateral (HCC) 2018   Essential hypertension    History of being hospitalized    1 month ago for 3 days, vomiting, and kink in upper intestine   History of hiatal hernia    Mediastinal adenopathy 04/2023   MRSA nasal colonization    Non-small cell lung cancer (HCC) 2022   right   Obesity (BMI 30-39.9)    Osteoarthritis    Osteoporosis    PE (pulmonary thromboembolism) (HCC) 2018   Pneumonia     Small bowel obstruction (HCC) 03/20/2023   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HCC)    Type 2 diabetes mellitus with obesity (HCC)    Vertigo     Medications:  Currently on Warfarin 5 mg po daily  Assessment: 73 yo presented to ED with complaint of increased shortness of breath.  Was recently hospitalized 7/16-7/18 with pneumonia. During workup patient was also found to have new acute DVT despite being on warfarin.  Pharmacy consulted to manage Warfarin.  Interacting medication: Azithromycin   Goal of Therapy:  INR 2-3 Monitor platelets by anticoagulation protocol: Yes    Date INR Warfarin Dose  7/20 2.9 5 mg (PTA)  7/21 3.0 1.5 mg  7/22 2.6 2.5 mg     Plan:  INR is therapeutic at 3.0. Patient continues on azithromycin  and had been taking levofloxacin  prior to admission. Give warfarin 2.5mg  PO x 1 today Daily INR while admitted CBC at least every 72 hours  Will M. Lenon, PharmD Clinical Pharmacist 12/13/2023 7:56 AM

## 2023-12-13 NOTE — Plan of Care (Signed)

## 2023-12-13 NOTE — Progress Notes (Addendum)
 PROGRESS NOTE    Kara [name]  FMW:[id number] DOB: 07-29-1950 DOA: 12/11/2023 PCP: Sadie Manna, MD  127A/127A-AA  LOS: 2 days   Brief hospital course:   Assessment & Plan: Kara [name] is a 73 y.o. female with medical history significant of T2DM, HTN, history of PE, non-small cell lung cancer with recurrence who was recently admitted with sepsis related to pneumonia.    She was discharged home approximately 2 days ago on Levaquin  and was taking this as directed.  Additionally her lung cancer has recurred and she is on adjuvant Tagrisso  daily.  She returned with worsening shortness of breath.    * Acute Hypoxic respiratory failure (HCC) --vitals showed intermittent O2 desat to 88% while on 2L O2.  CTA neg PE, and Interval worsening of peribronchovascular patchy ground-glass and consolidative opacities most prominent within the right upper lobe. Finding may represent infection/inflammation versus alveolar hemorrhage. --pt denied sputum production or hemoptysis.   --started on IV Rocephin  and p.o. azithromycin  --hycodan PRN --cont ceftriaxone  and azithromycin  --Continue supplemental O2 to keep sats >=90%, wean as tolerated --consider pulm consult tomorrow  Chronic hip pain (Right) Spondylosis without myelopathy or radiculopathy, lumbosacral region Chronic opioids use --cont home Norco and gabapentin   Diabetic peripheral neuropathy (HCC) --cont gabapentin   Recurrent non-small cell lung cancer (NSCLC) (HCC) --cont Targrisso  type 2 diabetes mellitus with hyperglycemia, without long-term current use of insulin  (HCC) --A1c 7.2. --hold home glipizide    Hx of PE on Chronic anticoagulation (COUMADIN ) --cont warfarin for now.  Consider changing to DOAC (both Eliquis  and Xarelto  $47 per month).  Acute right DVT New DVT despite full-strength anticoagulation.  History of prior IVC implants but she had this removed.  Unclear whether pt failed warfarin or her  INR was not stable. --cont warfarin for now.  Consider changing to DOAC (both Eliquis  and Xarelto  $47 per month).  Chemotherapy induced neutropenia (HCC) Platelet count is stable, WBC is stable as well and low. She is mildly anemic  Chronic diastolic (congestive) heart failure (HCC) Normal BNP and no evidence of volume overload at present.  Benign essential hypertension --hold irbesartan  due to soft BP   DVT prophylaxis: On:Coumadin  Code Status: Full code  Family Communication:  Level of care: Med-Surg Dispo:   The patient is from: home Anticipated d/c is to: home Anticipated d/c date is: 2-3 days   Subjective and Interval History:  Pt reported excessive coughing leading to rib cage pain.  No sputum production.  Continued to have dyspnea.   Objective: Vitals:   12/13/23 1313 12/13/23 1615 12/13/23 1954 12/13/23 2038  BP:  (!) 90/53  (!) 108/55  Pulse:  96  (!) 108  Resp:  16  16  Temp:  98 F (36.7 C)  99.1 F (37.3 C)  TempSrc:  Oral    SpO2: 97% 97% 97% 92%  Height:        Intake/Output Summary (Last 24 hours) at 12/13/2023 2136 Last data filed at 12/13/2023 1800 Gross per 24 hour  Intake 703 ml  Output --  Net 703 ml   There were no vitals filed for this visit.  Examination:   Constitutional: NAD, AAOx3 HEENT: conjunctivae and lids normal, EOMI CV: No cyanosis.   RESP: normal respiratory effort, on 2L Neuro: II - XII grossly intact.   Psych: Normal mood and affect.  Appropriate judgement and reason   Data Reviewed: I have personally reviewed labs and imaging studies  Time spent: 35 minutes  [name], MD [facility] If 7PM-7AM, please contact night-coverage 12/13/2023, 9:36 PM

## 2023-12-14 DIAGNOSIS — J9601 Acute respiratory failure with hypoxia: Secondary | ICD-10-CM | POA: Diagnosis not present

## 2023-12-14 LAB — PROTIME-INR
INR: 2.1 — ABNORMAL HIGH (ref 0.8–1.2)
Prothrombin Time: 25 s — ABNORMAL HIGH (ref 11.4–15.2)

## 2023-12-14 LAB — GLUCOSE, CAPILLARY
Glucose-Capillary: 167 mg/dL — ABNORMAL HIGH (ref 70–99)
Glucose-Capillary: 173 mg/dL — ABNORMAL HIGH (ref 70–99)
Glucose-Capillary: 155 mg/dL — ABNORMAL HIGH (ref 70–99)
Glucose-Capillary: 202 mg/dL — ABNORMAL HIGH (ref 70–99)

## 2023-12-14 MED ORDER — WARFARIN SODIUM 5 MG PO TABS
5.0000 mg | ORAL_TABLET | Freq: Once | ORAL | Status: AC
  Administered 2023-12-14: 5 mg via ORAL
  Filled 2023-12-14: qty 1

## 2023-12-14 NOTE — TOC Initial Note (Signed)
 Transition of Care [facility]) - Initial/Assessment Note    Patient Details  Name: Kara [name] MRN: [id number] Date of Birth: [birth year]  [facility]) CM/SW Contact:    Dalia GORMAN Fuse, RN Phone Number: 12/14/2023, 2:00 PM  Clinical Narrative:                 Transition of Care Department [facility]) has reviewed patient and no TOC needs have been identified at this time.  If new patient transition needs arise, please place a TOC consult.        Patient Goals and CMS Choice            Expected Discharge Plan and Services                                              Prior Living Arrangements/Services                       Activities of Daily Living   ADL Screening (condition at time of admission) Independently performs ADLs?: No Does the patient have a NEW difficulty with bathing/dressing/toileting/self-feeding that is expected to last >3 days?: No Does the patient have a NEW difficulty with getting in/out of bed, walking, or climbing stairs that is expected to last >3 days?: No Does the patient have a NEW difficulty with communication that is expected to last >3 days?: No Is the patient deaf or have difficulty hearing?: No Does the patient have difficulty seeing, even when wearing glasses/contacts?: No Does the patient have difficulty concentrating, remembering, or making decisions?: No  Permission Sought/Granted                  Emotional Assessment              Admission diagnosis:  Hypoxia [R09.02] Malignant neoplasm of lung, unspecified laterality, unspecified part of lung (HCC) [C34.90] Acute deep vein thrombosis (DVT) of proximal vein of lower extremity, unspecified laterality (HCC) [I82.4Y9] Pneumonia due to infectious organism, unspecified laterality, unspecified part of lung [J18.9] Hypoxic respiratory failure (HCC) [J96.91] Patient Active Problem List   Diagnosis Date Noted   Hypoxic respiratory failure (HCC)  12/11/2023   Sepsis due to pneumonia (HCC) 12/08/2023   Diabetes mellitus without complication (HCC) 12/08/2023   Sepsis, unspecified organism (HCC) 12/08/2023   Acute diarrhea 12/08/2023   Contusion of elbow and forearm, left, initial encounter 12/08/2023   SIRS (systemic inflammatory response syndrome) (HCC) 08/09/2023   Acute cystitis without hematuria 08/09/2023   Acute GI bleeding 08/02/2023   Lower GI bleed 08/02/2023   Chemotherapy induced neutropenia (HCC) 07/22/2023   Cancer of upper lobe of right lung (HCC) 05/27/2023   Recurrent non-small cell lung cancer (NSCLC) (HCC) 05/27/2023   Mediastinal adenopathy 05/16/2023   (HFpEF) heart failure with preserved ejection fraction (HCC) 05/09/2023   Piriformis muscle pain (Right) 11/01/2022   Lumbar facet joint pain 09/02/2022   Greater trochanteric bursitis (Left) 01/28/2022   Other bursitis of hip, not elsewhere classified (gluteus medius bursa) (Left) 01/28/2022   Chronic low back pain (Right) w/o sciatica 01/07/2022   History of recurrent deep vein thrombosis (DVT) 11/21/2021   Statin declined 11/02/2021   Osteoarthritis of hips (Bilateral) 09/15/2021   Greater trochanteric bursitis (Right) 09/15/2021   Chronic hip pain (Right) 09/15/2021   Chronic hip pain (Bilateral) (R>L) 09/01/2021   Chronic use  of opiate for therapeutic purpose 10/22/2020   Metabolic syndrome 08/14/2020   Generalized weakness 08/14/2020   Truncal obesity 08/04/2020   Latex precautions, history of latex allergy 06/24/2020   Hyperglycemia 06/24/2020   Uncomplicated opioid dependence (HCC) 06/15/2020   Spasm of muscle of lower back 01/29/2020   Abnormal MRI, lumbar spine (08/09/2019) 08/14/2019   Spondylolisthesis, lumbosacral region 07/26/2019   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   Chronic sacroiliac joint pain (Left) 02/28/2019   Small bowel obstruction due to adhesions (HCC) 08/24/2018   Coccygodynia 05/09/2018   Acute deep vein thrombosis  (DVT) of distal end of right lower extremity (HCC) 05/08/2018   DDD (degenerative disc disease), lumbosacral 04/11/2018   Chronic anticoagulation (COUMADIN ) 04/11/2018   Severe obesity (BMI 35.0-35.9 with comorbidity) (HCC) 03/15/2018   Secondary osteoarthritis of multiple sites 03/15/2018   Sacral insufficiency fracture 03/15/2018   Chronic low back pain (1ry area of Pain) (Bilateral) w/o sciatica 12/14/2017   Spondylosis without myelopathy or radiculopathy, lumbosacral region 12/14/2017   Other specified dorsopathies, sacral and sacrococcygeal region 12/14/2017   History of allergy to latex 11/09/2017   Grade 1-2 Anterolisthesis of L4/L5 & L5/S1 (stable as of 11/05/2021) 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) 11/02/2017   Lumbar pars defect (L3 and L4) (Bilateral) 11/02/2017   Diabetic peripheral neuropathy (HCC) 11/02/2017   Chronic lower extremity pain (2ry area of Pain) (Bilateral) 11/02/2017   Chronic radicular pain of lower extremity 11/02/2017   Osteoarthritis of hip (Right) 11/02/2017   Chronic low back pain (Bilateral) w/ sciatica (Bilateral) 10/13/2017   Chronic sacroiliac joint pain (Right) 10/13/2017   Chronic pain syndrome 10/13/2017   Long term current use of opiate analgesic 10/13/2017   Pharmacologic therapy 10/13/2017   Disorder of skeletal system 10/13/2017   Problems influencing health status 10/13/2017   Diverticulosis of colon 06/20/2017   History of pulmonary embolism 06/20/2017   S/P IVC filter 06/20/2017   Positive result for methicillin resistant Staphylococcus aureus (MRSA) screening 05/11/2017   History of colostomy reversal 02/24/2017   Chronic diastolic  (congestive) heart failure (HCC) 11/22/2016   HCAP (healthcare-associated pneumonia) 11/18/2016   Edema of both legs 11/15/2016   Benign essential hypertension 08/26/2016   DDD (degenerative disc disease), lumbar 07/14/2016   Acute non-recurrent maxillary sinusitis 05/21/2016   Chronic hip pain (Left) 02/23/2016   Nocturnal leg cramps 02/20/2015   Uncontrolled type 2 diabetes mellitus with hyperglycemia, without long-term current use of insulin  (HCC) 11/12/2014   PCP:  Sadie Manna, MD Pharmacy:   [facility], [place] - [address] [place] [address] Phone: [phone] Fax: [phone]  [facility] - [place], [place] - [address] [place] [address] Phone: [phone] Fax: [phone]  [facility] [address] [place] [address] Phone: [phone] Fax: [phone]     Social Drivers of Health (SDOH) Social History: SDOH Screenings   Food Insecurity: No Food Insecurity (12/12/2023)  Housing: Low Risk  (12/12/2023)  Transportation Needs: No Transportation Needs (12/12/2023)  Utilities: Not At Risk (12/12/2023)  Depression (PHQ2-9): Low Risk  (11/29/2023)  Financial Resource Strain: Low Risk  (05/19/2023)   Received from [facility]  Social Connections: Moderately Isolated (12/12/2023)  Tobacco Use: Medium Risk (12/11/2023)   SDOH Interventions:     Readmission Risk Interventions     No data to display

## 2023-12-14 NOTE — Care Management Important Message (Signed)
 Important Message  Patient Details  Name: Kara [name] MRN: [id number] Date of Birth: June 18, 1950   Important Message Given:  Yes - Medicare IM     Arshdeep Bolger W, CMA 12/14/2023, 12:35 PM

## 2023-12-14 NOTE — Progress Notes (Signed)
 PROGRESS NOTE    Kara [name]  FMW:[id number] DOB: 12/22/1950 DOA: 12/11/2023 PCP: Sadie Manna, MD   Assessment & Plan:   Principal Problem:   Hypoxic respiratory failure (HCC) Active Problems:   Diabetic peripheral neuropathy (HCC)   Recurrent non-small cell lung cancer (NSCLC) (HCC)   Uncontrolled type 2 diabetes mellitus with hyperglycemia, without long-term current use of insulin  (HCC)   Chronic anticoagulation (COUMADIN )   Long term current use of opiate analgesic   Benign essential hypertension   Acute deep vein thrombosis (DVT) of distal end of right lower extremity (HCC)   Chronic diastolic (congestive) heart failure (HCC)   Chemotherapy induced neutropenia (HCC)  Assessment and Plan: Acute hypoxic respiratory failure:intermittent O2 desat to 88% while on 2L O2.  CTA neg PE. Continue on IV rocephin , azithromycin , bronchodilators and encourage incentive spirometry   Chronic pain: of right hip. Continue on home dose of norco, gabapentin     Diabetic peripheral neuropathy: continue on home dose of gabapentin     Recurrent non-small cell lung cancer: continue on home dose of tagrisso     DM2: fair control, HbA1c 7.2. Continue on SSI w/ accuchecks   Hx of PE: continue on home dose of warfarin. Consider changing to eliquis  prior to d/c    Acute right DVT: etiology unclear. Unclear if pt failed warfarin or if INR was not therapeutic. Continue on warfarin. Consider changing to eliquis  prior to d/c    Chemotherapy induced bicytopenia: anemia & thrombocytopenia. No need for a transfusion currently. Will continue to monitor    Chronic diastolic CHF: appears compensated. Monitor I/Os  HTN: holding home dose of irbesartan  as BP is low normal    Obesity: BMI 33.1. Would benefit from weight loss      DVT prophylaxis: warfarin Code Status: full  Family Communication: Disposition Plan: likely d/c back home  Level of care: Med-Surg  Status is:  Inpatient Remains inpatient appropriate because: severity of illness, requiring IV abxs    Consultants:    Procedures:   Antimicrobials: azithromycin , rocephin    Subjective: Pt c/o shortness of breath   Objective: Vitals:   12/14/23 0301 12/14/23 0508 12/14/23 0730 12/14/23 0913  BP:  (!) 102/53  (!) 108/49  Pulse:  (!) 101  100  Resp:    16  Temp:  98.4 F (36.9 C)  98.1 F (36.7 C)  TempSrc:      SpO2: 94% 93% 95% 92%  Height:        Intake/Output Summary (Last 24 hours) at 12/14/2023 0932 Last data filed at 12/13/2023 1800 Gross per 24 hour  Intake 120 ml  Output --  Net 120 ml   There were no vitals filed for this visit.  Examination:  General exam: Appears calm and comfortable  Respiratory system: diminished breath sounds b/l  Cardiovascular system: S1 & S2 +. No rubs, gallops or clicks.  Gastrointestinal system: Abdomen is nondistended, soft and nontender. Normal bowel sounds heard. Central nervous system: Alert and oriented. Moves all extremities  Psychiatry: Judgement and insight appear normal. Flat mood and affect    Data Reviewed: I have personally reviewed following labs and imaging studies  CBC: Recent Labs  Lab 12/07/23 1858 12/09/23 0513 12/11/23 2106 12/12/23 0425 12/13/23 0327  WBC 2.1* 6.3 3.8* 4.0 5.0  NEUTROABS  --   --  2.6  --   --   HGB 13.2 10.5* 11.2* 10.6* 10.7*  HCT 40.7 32.7* 33.8* 32.9* 32.7*  MCV 93.6 95.1 90.1 94.0 92.4  PLT  83* 64* 80* 84* 87*   Basic Metabolic Panel: Recent Labs  Lab 12/07/23 1858 12/09/23 0513 12/11/23 2106 12/12/23 0425 12/13/23 0327  NA 140 141 138 138 139  K 4.0 4.1 3.5 3.2* 3.9  CL 108 116* 106 103 105  CO2 21* 22 23 24 25   GLUCOSE 182* 139* 144* 99 131*  BUN 13 12 11 11 10   CREATININE 1.13* 0.85 0.74 0.74 0.64  CALCIUM  9.2 7.8* 8.5* 8.4* 8.2*  MG  --   --   --   --  2.1   GFR: Estimated Creatinine Clearance: 65.6 mL/min (by C-G formula based on SCr of 0.64 mg/dL). Liver Function  Tests: Recent Labs  Lab 12/07/23 1858 12/11/23 2106  AST 31 16  ALT 18 10  ALKPHOS 61 40  BILITOT 0.9 0.6  PROT 7.7 7.5  ALBUMIN 3.4* 2.8*   Recent Labs  Lab 12/07/23 1858 12/11/23 2106  LIPASE 26 23   No results for input(s): AMMONIA in the last 168 hours. Coagulation Profile: Recent Labs  Lab 12/09/23 0513 12/11/23 2106 12/12/23 0726 12/13/23 0327 12/14/23 0503  INR 2.3* 2.9* 3.0* 2.6* 2.1*   Cardiac Enzymes: No results for input(s): CKTOTAL, CKMB, CKMBINDEX, TROPONINI in the last 168 hours. BNP (last 3 results) No results for input(s): PROBNP in the last 8760 hours. HbA1C: No results for input(s): HGBA1C in the last 72 hours. CBG: Recent Labs  Lab 12/13/23 0737 12/13/23 1208 12/13/23 1614 12/13/23 2138 12/14/23 0912  GLUCAP 154* 180* 207* 126* 167*   Lipid Profile: No results for input(s): CHOL, HDL, LDLCALC, TRIG, CHOLHDL, LDLDIRECT in the last 72 hours. Thyroid  Function Tests: No results for input(s): TSH, T4TOTAL, FREET4, T3FREE, THYROIDAB in the last 72 hours. Anemia Panel: No results for input(s): VITAMINB12, FOLATE, FERRITIN, TIBC, IRON, RETICCTPCT in the last 72 hours. Sepsis Labs: Recent Labs  Lab 12/07/23 2241 12/08/23 0124 12/11/23 2106 12/11/23 2309 12/11/23 2310  PROCALCITON  --   --  5.22 5.18  --   LATICACIDVEN 2.1* 1.8 1.0  --  0.7    Recent Results (from the past 240 hours)  Blood culture (routine x 2)     Status: None   Collection Time: 12/07/23 10:41 PM   Specimen: BLOOD  Result Value Ref Range Status   Specimen Description BLOOD BLOOD RIGHT ARM  Final   Special Requests   Final    BOTTLES DRAWN AEROBIC AND ANAEROBIC Blood Culture adequate volume   Culture   Final    NO GROWTH 5 DAYS Performed at [facility], [address] [place], [place] [address]    Report Status 12/12/2023 FINAL  Final  Blood culture (routine x 2)     Status: None   Collection Time:  12/07/23 10:41 PM   Specimen: BLOOD  Result Value Ref Range Status   Specimen Description BLOOD BLOOD LEFT FOREARM  Final   Special Requests   Final    BOTTLES DRAWN AEROBIC AND ANAEROBIC Blood Culture results may not be optimal due to an excessive volume of blood received in culture bottles   Culture   Final    NO GROWTH 5 DAYS Performed at [facility], [address] [place], [place] [address]    Report Status 12/12/2023 FINAL  Final  Urine Culture (for pregnant, neutropenic or urologic patients or patients with an indwelling urinary catheter)     Status: Abnormal   Collection Time: 12/08/23  1:32 AM   Specimen: Urine, Clean Catch  Result Value  Ref Range Status   Specimen Description   Final    URINE, CLEAN CATCH Performed at [facility], [address] [place], [place] [address]    Special Requests   Final    NONE Performed at [facility], [address] [place], [place] [address]    Culture 50,000 COLONIES/mL KLEBSIELLA PNEUMONIAE (A)  Final   Report Status 12/10/2023 FINAL  Final   Organism ID, Bacteria KLEBSIELLA PNEUMONIAE (A)  Final      Susceptibility   Klebsiella pneumoniae - MIC*    AMPICILLIN RESISTANT Resistant     CEFAZOLIN  <=4 SENSITIVE Sensitive     CEFEPIME  <=0.12 SENSITIVE Sensitive     CEFTRIAXONE  <=0.25 SENSITIVE Sensitive     CIPROFLOXACIN  <=0.25 SENSITIVE Sensitive     GENTAMICIN <=1 SENSITIVE Sensitive     IMIPENEM <=0.25 SENSITIVE Sensitive     NITROFURANTOIN <=16 SENSITIVE Sensitive     TRIMETH/SULFA <=20 SENSITIVE Sensitive     AMPICILLIN/SULBACTAM <=2 SENSITIVE Sensitive     PIP/TAZO <=4 SENSITIVE Sensitive ug/mL    * 50,000 COLONIES/mL KLEBSIELLA PNEUMONIAE  SARS Coronavirus 2 by RT PCR (hospital order, performed in [facility] hospital lab) *cepheid single result test* Anterior Nasal Swab     Status: None   Collection Time: 12/08/23 10:26 AM   Specimen: Anterior Nasal Swab  Result Value Ref Range Status    SARS Coronavirus 2 by RT PCR NEGATIVE NEGATIVE Final    Comment: (NOTE) SARS-CoV-2 target nucleic acids are NOT DETECTED.  The SARS-CoV-2 RNA is generally detectable in upper and lower respiratory specimens during the acute phase of infection. The lowest concentration of SARS-CoV-2 viral copies this assay can detect is 250 copies / mL. A negative result does not preclude SARS-CoV-2 infection and should not be used as the sole basis for treatment or other patient management decisions.  A negative result may occur with improper specimen collection / handling, submission of specimen other than nasopharyngeal swab, presence of viral mutation(s) within the areas targeted by this assay, and inadequate number of viral copies (<250 copies / mL). A negative result must be combined with clinical observations, patient history, and epidemiological information.  Fact Sheet for Patients:   RoadLapTop.co.za  Fact Sheet for Healthcare Providers: http://kim-miller.com/  This test is not yet approved or  cleared by the [place]  FDA and has been authorized for detection and/or diagnosis of SARS-CoV-2 by FDA under an Emergency Use Authorization (EUA).  This EUA will remain in effect (meaning this test can be used) for the duration of the COVID-19 declaration under Section 564(b)(1) of the Act, 21 U.S.C. section 360bbb-3(b)(1), unless the authorization is terminated or revoked sooner.  Performed at [facility], [address] [place], [place] [address]   Respiratory (~20 pathogens) panel by PCR     Status: None   Collection Time: 12/08/23 11:23 AM   Specimen: Nasopharyngeal Swab; Respiratory  Result Value Ref Range Status   Adenovirus NOT DETECTED NOT DETECTED Final   Coronavirus 229E NOT DETECTED NOT DETECTED Final    Comment: (NOTE) The Coronavirus on the Respiratory Panel, DOES NOT test for the novel  Coronavirus (2019 nCoV)     Coronavirus HKU1 NOT DETECTED NOT DETECTED Final   Coronavirus NL63 NOT DETECTED NOT DETECTED Final   Coronavirus OC43 NOT DETECTED NOT DETECTED Final   Metapneumovirus NOT DETECTED NOT DETECTED Final   Rhinovirus / Enterovirus NOT DETECTED NOT DETECTED Final   Influenza A NOT DETECTED NOT DETECTED Final  Influenza B NOT DETECTED NOT DETECTED Final   Parainfluenza Virus 1 NOT DETECTED NOT DETECTED Final   Parainfluenza Virus 2 NOT DETECTED NOT DETECTED Final   Parainfluenza Virus 3 NOT DETECTED NOT DETECTED Final   Parainfluenza Virus 4 NOT DETECTED NOT DETECTED Final   Respiratory Syncytial Virus NOT DETECTED NOT DETECTED Final   Bordetella pertussis NOT DETECTED NOT DETECTED Final   Bordetella Parapertussis NOT DETECTED NOT DETECTED Final   Chlamydophila pneumoniae NOT DETECTED NOT DETECTED Final   Mycoplasma pneumoniae NOT DETECTED NOT DETECTED Final    Comment: Performed at [facility], [address], [place], [place] [address]  Blood culture (routine x 2)     Status: None (Preliminary result)   Collection Time: 12/11/23  9:06 PM   Specimen: BLOOD  Result Value Ref Range Status   Specimen Description BLOOD RIGHT [facility]  Final   Special Requests   Final    BOTTLES DRAWN AEROBIC AND ANAEROBIC Blood Culture adequate volume   Culture   Final    NO GROWTH 3 DAYS Performed at [facility], [address] [place], [place] [address]    Report Status PENDING  Incomplete  Blood culture (routine x 2)     Status: None (Preliminary result)   Collection Time: 12/12/23  2:37 AM   Specimen: BLOOD  Result Value Ref Range Status   Specimen Description BLOOD BLOOD RIGHT ARM  Final   Special Requests   Final    BOTTLES DRAWN AEROBIC AND ANAEROBIC Blood Culture results may not be optimal due to an inadequate volume of blood received in culture bottles   Culture   Final    NO GROWTH 2 DAYS Performed at [facility], [address] [place], [place] [address]    Report  Status PENDING  Incomplete  Urine Culture     Status: None   Collection Time: 12/12/23  9:00 AM   Specimen: Urine, Clean Catch  Result Value Ref Range Status   Specimen Description   Final    URINE, CLEAN CATCH Performed at [facility], [address], [place], [place] [address]    Special Requests   Final    NONE Reflexed from [id number] Performed at [facility], [address] [place] [place], [place] [address]    Culture   Final    NO GROWTH Performed at [facility], [address] [place], [place] [address]    Report Status 12/13/2023 FINAL  Final         Radiology Studies: No results found.      Scheduled Meds:  albuterol   2.5 mg Nebulization Q6H   azithromycin   500 mg Oral Daily   cholecalciferol   1,000 Units Oral Daily   cyanocobalamin   5,000 mcg Oral Daily   cyclobenzaprine   10 mg Oral QHS   feeding supplement  237 mL Oral BID BM   gabapentin   200 mg Oral TID with meals   gabapentin   300 mg Oral QHS   insulin  aspart  0-15 Units Subcutaneous TID WC   insulin  aspart  0-5 Units Subcutaneous QHS   magnesium  oxide  400 mg Oral QHS   osimertinib  mesylate  80 mg Oral Daily   oxybutynin   5 mg Oral BID   pantoprazole   40 mg Oral Daily   potassium chloride  SA  20 mEq Oral Daily   rOPINIRole   1 mg Oral QHS   sodium chloride  flush  3 mL Intravenous Q12H   sucralfate   1 g Oral TID WC   Warfarin - Pharmacist Dosing Inpatient   Does not apply q1600   Continuous Infusions:  cefTRIAXone  (ROCEPHIN )  IV 2 g (12/13/23 2201)     LOS: 3 days      Anthony CHRISTELLA Pouch, MD [facility] Pager [phone]  If 7PM-7AM, please contact night-coverage [email or web address] 12/14/2023, 9:32 AM

## 2023-12-14 NOTE — Consult Note (Signed)
 PHARMACY - ANTICOAGULATION CONSULT NOTE  Pharmacy Consult for warfarin management Indication: DVT  Allergies  Allergen Reactions   Adhesive [Tape] Other (See Comments)    Pt reports, when removing tape from skin most tapes pull her skin off. Ok to use paper tape   Avapro  [Irbesartan ] Anaphylaxis    Facial swelling   Morphine  And Codeine  Nausea And Vomiting   Other Hives    Chlorhexadine wipes/CHG wipes,    Latex Rash    Exam gloves/ does not react around elastic and lips don't swell when blowing balloons    Patient Measurements: Height: 5' 3 (160 cm) IBW/kg (Calculated) : 52.4  Vital Signs: Temp: 98.1 F (36.7 C) (07/23 0913) BP: 108/49 (07/23 0913) Pulse Rate: 100 (07/23 0913)  Labs: Recent Labs    12/11/23 2106 12/11/23 2310 12/12/23 0425 12/12/23 0726 12/13/23 0327 12/14/23 0503  HGB 11.2*  --  10.6*  --  10.7*  --   HCT 33.8*  --  32.9*  --  32.7*  --   PLT 80*  --  84*  --  87*  --   LABPROT 31.9*  --   --  32.6* 29.5* 25.0*  INR 2.9*  --   --  3.0* 2.6* 2.1*  CREATININE 0.74  --  0.74  --  0.64  --   TROPONINIHS 13 12  --   --   --   --     Estimated Creatinine Clearance: 65.6 mL/min (by C-G formula based on SCr of 0.64 mg/dL).   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, 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   CHF (congestive heart failure) (HCC)    followed colon resection, wouldn't let me get up   Chronic anticoagulation    Degenerative disc disease, lumbar    Diverticulitis 2018   Diverticulosis    DVT of lower extremity, bilateral (HCC) 2018   Essential hypertension    History of being hospitalized    1 month ago for 3 days, vomiting, and kink in upper intestine   History of hiatal hernia    Mediastinal adenopathy 04/2023   MRSA nasal colonization    Non-small cell lung cancer (HCC) 2022   right   Obesity (BMI 30-39.9)    Osteoarthritis    Osteoporosis    PE  (pulmonary thromboembolism) (HCC) 2018   Pneumonia    Small bowel obstruction (HCC) 03/20/2023   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HCC)    Type 2 diabetes mellitus with obesity (HCC)    Vertigo     Medications:  Currently on Warfarin 5 mg po daily TWD 35mg   Assessment: 73 yo presented to ED with complaint of increased shortness of breath.  Was recently hospitalized 7/16-7/18 with pneumonia. During workup patient was also found to have new acute DVT despite being on warfarin.  Pharmacy consulted to manage Warfarin.  Interacting medication: Azithromycin   Goal of Therapy:  INR 2-3 Monitor platelets by anticoagulation protocol: Yes    Date INR Warfarin Dose  7/20 2.9 5 mg (PTA)  7/21 3.0 1.5 mg  7/22 2.6 2.5 mg  7/23 2.1 5 mg     Plan:  INR remains therapeutic, now lower end. Patient continues on azithromycin  and had been taking levofloxacin  prior to admission. Will give warfarin same as home dose today. Give warfarin 5mg  PO x 1 today Daily INR while admitted CBC at least every 72 hours  Will M. Lenon, PharmD Clinical Pharmacist 12/14/2023 9:32 AM

## 2023-12-14 NOTE — Plan of Care (Signed)

## 2023-12-15 DIAGNOSIS — J9601 Acute respiratory failure with hypoxia: Secondary | ICD-10-CM | POA: Diagnosis not present

## 2023-12-15 LAB — BASIC METABOLIC PANEL WITH GFR
Anion gap: 9 (ref 5–15)
BUN: 10 mg/dL (ref 8–23)
CO2: 26 mmol/L (ref 22–32)
Calcium: 8.2 mg/dL — ABNORMAL LOW (ref 8.9–10.3)
Chloride: 101 mmol/L (ref 98–111)
Creatinine, Ser: 0.52 mg/dL (ref 0.44–1.00)
GFR, Estimated: >60 mL/min (ref >60)
Glucose, Bld: 170 mg/dL — ABNORMAL HIGH (ref 70–99)
Potassium: 4.2 mmol/L (ref 3.5–5.1)
Sodium: 136 mmol/L (ref 135–145)

## 2023-12-15 LAB — GLUCOSE, CAPILLARY
Glucose-Capillary: 148 mg/dL — ABNORMAL HIGH (ref 70–99)
Glucose-Capillary: 172 mg/dL — ABNORMAL HIGH (ref 70–99)
Glucose-Capillary: 268 mg/dL — ABNORMAL HIGH (ref 70–99)
Glucose-Capillary: 276 mg/dL — ABNORMAL HIGH (ref 70–99)

## 2023-12-15 LAB — CBC
HCT: 32.9 % — ABNORMAL LOW (ref 36.0–46.0)
Hemoglobin: 10.5 g/dL — ABNORMAL LOW (ref 12.0–15.0)
MCH: 29.7 pg (ref 26.0–34.0)
MCHC: 31.9 g/dL (ref 30.0–36.0)
MCV: 92.9 fL (ref 80.0–100.0)
Platelets: 104 K/uL — ABNORMAL LOW (ref 150–400)
RBC: 3.54 MIL/uL — ABNORMAL LOW (ref 3.87–5.11)
RDW: 13.8 % (ref 11.5–15.5)
WBC: 3.8 K/uL — ABNORMAL LOW (ref 4.0–10.5)
nRBC: 0 % (ref 0.0–0.2)

## 2023-12-15 LAB — PROTIME-INR
INR: 1.9 — ABNORMAL HIGH (ref 0.8–1.2)
Prothrombin Time: 22.9 s — ABNORMAL HIGH (ref 11.4–15.2)

## 2023-12-15 MED ORDER — MAGIC MOUTHWASH W/LIDOCAINE
15.0000 mL | Freq: Three times a day (TID) | ORAL | Status: DC
  Administered 2023-12-15 – 2023-12-20 (×14): 15 mL via ORAL
  Filled 2023-12-15 (×18): qty 15

## 2023-12-15 MED ORDER — HYDROCOD POLI-CHLORPHE POLI ER 10-8 MG/5ML PO SUER
5.0000 mL | Freq: Two times a day (BID) | ORAL | Status: DC | PRN
  Administered 2023-12-15 – 2023-12-20 (×5): 5 mL via ORAL
  Filled 2023-12-15 (×5): qty 5

## 2023-12-15 MED ORDER — ENOXAPARIN SODIUM 100 MG/ML IJ SOSY
1.0000 mg/kg | PREFILLED_SYRINGE | Freq: Two times a day (BID) | INTRAMUSCULAR | Status: DC
  Administered 2023-12-15 – 2023-12-17 (×4): 85 mg via SUBCUTANEOUS
  Filled 2023-12-15 (×4): qty 1

## 2023-12-15 MED ORDER — WARFARIN SODIUM 6 MG PO TABS
6.0000 mg | ORAL_TABLET | Freq: Once | ORAL | Status: AC
  Administered 2023-12-15: 6 mg via ORAL
  Filled 2023-12-15: qty 1

## 2023-12-15 MED ORDER — METHYLPREDNISOLONE SODIUM SUCC 40 MG IJ SOLR
40.0000 mg | INTRAMUSCULAR | Status: DC
  Administered 2023-12-15 – 2023-12-20 (×6): 40 mg via INTRAVENOUS
  Filled 2023-12-15 (×6): qty 1

## 2023-12-15 NOTE — Progress Notes (Addendum)
 PROGRESS NOTE    Kara [name]  FMW:[id number] DOB: 1950-09-23 DOA: 12/11/2023 PCP: Sadie Manna, MD   Assessment & Plan:   Principal Problem:   Hypoxic respiratory failure (HCC) Active Problems:   Diabetic peripheral neuropathy (HCC)   Recurrent non-small cell lung cancer (NSCLC) (HCC)   Uncontrolled type 2 diabetes mellitus with hyperglycemia, without long-term current use of insulin  (HCC)   Chronic anticoagulation (COUMADIN )   Long term current use of opiate analgesic   Benign essential hypertension   Acute deep vein thrombosis (DVT) of distal end of right lower extremity (HCC)   Chronic diastolic (congestive) heart failure (HCC)   Chemotherapy induced neutropenia (HCC)  Assessment and Plan: Acute hypoxic respiratory failure:intermittent O2 desat to 88% while on 2L O2.  CTA neg PE. Continue on IV rocephin , azithromycin , bronchodilators and encourage incentive spirometry  Likely pneumonia: continue on IV rocephin , azithromycin , bronchodilators & encourage incentive spirometry. Started on IV steroids    Chronic pain: of right hip. Continue on home dose of gabapentin , norco    Diabetic peripheral neuropathy: continue on home dose of gabapentin     Recurrent non-small cell lung cancer: continue on home dose of tagrisso     DM2: fair control, HbA1c 7.2. Continue on SSI w/ accuchecks    Hx of PE: continue on home dose of warfarin. Pt wants to think over taking eliquis  instead of warfarin. Pt is taking warfarin b/c it is cheaper     Acute right DVT: etiology unclear. Unclear if pt failed warfarin or if INR was not therapeutic. Continue on warfarin and now lovenox  as INR is subtherapeutic. Pt is taking warfarin b/c it is cheaper. Pt wants to think over taking eliquis  instead of warfarin    Chemotherapy induced pancytopenia: labile. No need for a transfusion currently    Chronic diastolic CHF: appears compensated. Monitor I/Os   HTN: holding home dose of irbesartan  as  BP is low normal    Obesity: BMI 33.1. Would benefit from weight loss       DVT prophylaxis: warfarin, lovenox  Code Status: full  Family Communication: Disposition Plan: likely d/c back home  Level of care: Med-Surg  Status is: Inpatient Remains inpatient appropriate because: severity of illness, requiring IV abxs    Consultants:    Procedures:   Antimicrobials: azithromycin , rocephin    Subjective: Pt c/o cough & wheezing   Objective: Vitals:   12/15/23 0216 12/15/23 0517 12/15/23 0719 12/15/23 0743  BP:  (!) 101/44  (!) 107/51  Pulse:  97  (!) 101  Resp:  16  18  Temp:    98.4 F (36.9 C)  TempSrc:      SpO2: 96% 94% 96% 93%  Height:        Intake/Output Summary (Last 24 hours) at 12/15/2023 0756 Last data filed at 12/14/2023 1443 Gross per 24 hour  Intake 240 ml  Output --  Net 240 ml   There were no vitals filed for this visit.  Examination:  General exam: Appears comfortable  Respiratory system: decreased breath sounds b/l. Intermittent wheezes Cardiovascular system: S1/S2+. No rubs or gallops   Gastrointestinal system: abd is soft, NT, ND & hypoactive bowel sounds  Central nervous system: alert & oriented. Moves all extremities  Psychiatry: judgement and insight appears normal. Flat mood and affect    Data Reviewed: I have personally reviewed following labs and imaging studies  CBC: Recent Labs  Lab 12/09/23 0513 12/11/23 2106 12/12/23 0425 12/13/23 0327 12/15/23 0526  WBC 6.3 3.8* 4.0 5.0  3.8*  NEUTROABS  --  2.6  --   --   --   HGB 10.5* 11.2* 10.6* 10.7* 10.5*  HCT 32.7* 33.8* 32.9* 32.7* 32.9*  MCV 95.1 90.1 94.0 92.4 92.9  PLT 64* 80* 84* 87* 104*   Basic Metabolic Panel: Recent Labs  Lab 12/09/23 0513 12/11/23 2106 12/12/23 0425 12/13/23 0327 12/15/23 0526  NA 141 138 138 139 136  K 4.1 3.5 3.2* 3.9 4.2  CL 116* 106 103 105 101  CO2 22 23 24 25 26   GLUCOSE 139* 144* 99 131* 170*  BUN 12 11 11 10 10   CREATININE  0.85 0.74 0.74 0.64 0.52  CALCIUM  7.8* 8.5* 8.4* 8.2* 8.2*  MG  --   --   --  2.1  --    GFR: Estimated Creatinine Clearance: 65.6 mL/min (by C-G formula based on SCr of 0.52 mg/dL). Liver Function Tests: Recent Labs  Lab 12/11/23 2106  AST 16  ALT 10  ALKPHOS 40  BILITOT 0.6  PROT 7.5  ALBUMIN 2.8*   Recent Labs  Lab 12/11/23 2106  LIPASE 23   No results for input(s): AMMONIA in the last 168 hours. Coagulation Profile: Recent Labs  Lab 12/11/23 2106 12/12/23 0726 12/13/23 0327 12/14/23 0503 12/15/23 0526  INR 2.9* 3.0* 2.6* 2.1* 1.9*   Cardiac Enzymes: No results for input(s): CKTOTAL, CKMB, CKMBINDEX, TROPONINI in the last 168 hours. BNP (last 3 results) No results for input(s): PROBNP in the last 8760 hours. HbA1C: No results for input(s): HGBA1C in the last 72 hours. CBG: Recent Labs  Lab 12/14/23 0912 12/14/23 1213 12/14/23 1640 12/14/23 2023 12/15/23 0744  GLUCAP 167* 173* 155* 202* 148*   Lipid Profile: No results for input(s): CHOL, HDL, LDLCALC, TRIG, CHOLHDL, LDLDIRECT in the last 72 hours. Thyroid  Function Tests: No results for input(s): TSH, T4TOTAL, FREET4, T3FREE, THYROIDAB in the last 72 hours. Anemia Panel: No results for input(s): VITAMINB12, FOLATE, FERRITIN, TIBC, IRON, RETICCTPCT in the last 72 hours. Sepsis Labs: Recent Labs  Lab 12/11/23 2106 12/11/23 2309 12/11/23 2310  PROCALCITON 5.22 5.18  --   LATICACIDVEN 1.0  --  0.7    Recent Results (from the past 240 hours)  Blood culture (routine x 2)     Status: None   Collection Time: 12/07/23 10:41 PM   Specimen: BLOOD  Result Value Ref Range Status   Specimen Description BLOOD BLOOD RIGHT ARM  Final   Special Requests   Final    BOTTLES DRAWN AEROBIC AND ANAEROBIC Blood Culture adequate volume   Culture   Final    NO GROWTH 5 DAYS Performed at [facility], [address] [place], [place] [address]    Report  Status 12/12/2023 FINAL  Final  Blood culture (routine x 2)     Status: None   Collection Time: 12/07/23 10:41 PM   Specimen: BLOOD  Result Value Ref Range Status   Specimen Description BLOOD BLOOD LEFT FOREARM  Final   Special Requests   Final    BOTTLES DRAWN AEROBIC AND ANAEROBIC Blood Culture results may not be optimal due to an excessive volume of blood received in culture bottles   Culture   Final    NO GROWTH 5 DAYS Performed at [facility], [address], [place], [place] [address]    Report Status 12/12/2023 FINAL  Final  Urine Culture (for pregnant, neutropenic or urologic patients or patients with an indwelling urinary catheter)     Status: Abnormal  Collection Time: 12/08/23  1:32 AM   Specimen: Urine, Clean Catch  Result Value Ref Range Status   Specimen Description   Final    URINE, CLEAN CATCH Performed at [facility], [address] [place], [place] [address]    Special Requests   Final    NONE Performed at [facility], [address] [place], [place] [address]    Culture 50,000 COLONIES/mL KLEBSIELLA PNEUMONIAE (A)  Final   Report Status 12/10/2023 FINAL  Final   Organism ID, Bacteria KLEBSIELLA PNEUMONIAE (A)  Final      Susceptibility   Klebsiella pneumoniae - MIC*    AMPICILLIN RESISTANT Resistant     CEFAZOLIN  <=4 SENSITIVE Sensitive     CEFEPIME  <=0.12 SENSITIVE Sensitive     CEFTRIAXONE  <=0.25 SENSITIVE Sensitive     CIPROFLOXACIN  <=0.25 SENSITIVE Sensitive     GENTAMICIN <=1 SENSITIVE Sensitive     IMIPENEM <=0.25 SENSITIVE Sensitive     NITROFURANTOIN <=16 SENSITIVE Sensitive     TRIMETH/SULFA <=20 SENSITIVE Sensitive     AMPICILLIN/SULBACTAM <=2 SENSITIVE Sensitive     PIP/TAZO <=4 SENSITIVE Sensitive ug/mL    * 50,000 COLONIES/mL KLEBSIELLA PNEUMONIAE  SARS Coronavirus 2 by RT PCR (hospital order, performed in [facility] hospital lab) *cepheid single result test* Anterior Nasal Swab     Status: None    Collection Time: 12/08/23 10:26 AM   Specimen: Anterior Nasal Swab  Result Value Ref Range Status   SARS Coronavirus 2 by RT PCR NEGATIVE NEGATIVE Final    Comment: (NOTE) SARS-CoV-2 target nucleic acids are NOT DETECTED.  The SARS-CoV-2 RNA is generally detectable in upper and lower respiratory specimens during the acute phase of infection. The lowest concentration of SARS-CoV-2 viral copies this assay can detect is 250 copies / mL. A negative result does not preclude SARS-CoV-2 infection and should not be used as the sole basis for treatment or other patient management decisions.  A negative result may occur with improper specimen collection / handling, submission of specimen other than nasopharyngeal swab, presence of viral mutation(s) within the areas targeted by this assay, and inadequate number of viral copies (<250 copies / mL). A negative result must be combined with clinical observations, patient history, and epidemiological information.  Fact Sheet for Patients:   RoadLapTop.co.za  Fact Sheet for Healthcare Providers: http://kim-miller.com/  This test is not yet approved or  cleared by the [place]  FDA and has been authorized for detection and/or diagnosis of SARS-CoV-2 by FDA under an Emergency Use Authorization (EUA).  This EUA will remain in effect (meaning this test can be used) for the duration of the COVID-19 declaration under Section 564(b)(1) of the Act, 21 U.S.C. section 360bbb-3(b)(1), unless the authorization is terminated or revoked sooner.  Performed at [facility], [address] [place], [place] [address]   Respiratory (~20 pathogens) panel by PCR     Status: None   Collection Time: 12/08/23 11:23 AM   Specimen: Nasopharyngeal Swab; Respiratory  Result Value Ref Range Status   Adenovirus NOT DETECTED NOT DETECTED Final   Coronavirus 229E NOT DETECTED NOT DETECTED Final    Comment:  (NOTE) The Coronavirus on the Respiratory Panel, DOES NOT test for the novel  Coronavirus (2019 nCoV)    Coronavirus HKU1 NOT DETECTED NOT DETECTED Final   Coronavirus NL63 NOT DETECTED NOT DETECTED Final   Coronavirus OC43 NOT DETECTED NOT DETECTED Final   Metapneumovirus NOT DETECTED NOT DETECTED Final   Rhinovirus / Enterovirus NOT  DETECTED NOT DETECTED Final   Influenza A NOT DETECTED NOT DETECTED Final   Influenza B NOT DETECTED NOT DETECTED Final   Parainfluenza Virus 1 NOT DETECTED NOT DETECTED Final   Parainfluenza Virus 2 NOT DETECTED NOT DETECTED Final   Parainfluenza Virus 3 NOT DETECTED NOT DETECTED Final   Parainfluenza Virus 4 NOT DETECTED NOT DETECTED Final   Respiratory Syncytial Virus NOT DETECTED NOT DETECTED Final   Bordetella pertussis NOT DETECTED NOT DETECTED Final   Bordetella Parapertussis NOT DETECTED NOT DETECTED Final   Chlamydophila pneumoniae NOT DETECTED NOT DETECTED Final   Mycoplasma pneumoniae NOT DETECTED NOT DETECTED Final    Comment: Performed at [facility], [address], [place], [place] [address]  Blood culture (routine x 2)     Status: None (Preliminary result)   Collection Time: 12/11/23  9:06 PM   Specimen: BLOOD  Result Value Ref Range Status   Specimen Description BLOOD RIGHT [facility]  Final   Special Requests   Final    BOTTLES DRAWN AEROBIC AND ANAEROBIC Blood Culture adequate volume   Culture   Final    NO GROWTH 4 DAYS Performed at [facility], [address] [place], [place] [address]    Report Status PENDING  Incomplete  Blood culture (routine x 2)     Status: None (Preliminary result)   Collection Time: 12/12/23  2:37 AM   Specimen: BLOOD  Result Value Ref Range Status   Specimen Description BLOOD BLOOD RIGHT ARM  Final   Special Requests   Final    BOTTLES DRAWN AEROBIC AND ANAEROBIC Blood Culture results may not be optimal due to an inadequate volume of blood received in culture bottles   Culture   Final     NO GROWTH 3 DAYS Performed at [facility], [address] [place], [place] [address]    Report Status PENDING  Incomplete  Urine Culture     Status: None   Collection Time: 12/12/23  9:00 AM   Specimen: Urine, Clean Catch  Result Value Ref Range Status   Specimen Description   Final    URINE, CLEAN CATCH Performed at [facility], [address], [place], [place] [address]    Special Requests   Final    NONE Reflexed from [id number] Performed at [facility], [address] [place], [place] [address]    Culture   Final    NO GROWTH Performed at [facility], [address] [place], [place] [address]    Report Status 12/13/2023 FINAL  Final         Radiology Studies: No results found.      Scheduled Meds:  albuterol   2.5 mg Nebulization Q6H   azithromycin   500 mg Oral Daily   cholecalciferol   1,000 Units Oral Daily   cyanocobalamin   5,000 mcg Oral Daily   cyclobenzaprine   10 mg Oral QHS   feeding supplement  237 mL Oral BID BM   gabapentin   200 mg Oral TID with meals   gabapentin   300 mg Oral QHS   insulin  aspart  0-15 Units Subcutaneous TID WC   insulin  aspart  0-5 Units Subcutaneous QHS   magnesium  oxide  400 mg Oral QHS   osimertinib  mesylate  80 mg Oral Daily   oxybutynin   5 mg Oral BID   pantoprazole   40 mg Oral Daily   potassium chloride  SA  20 mEq Oral Daily   rOPINIRole   1 mg Oral  QHS   sodium chloride  flush  3 mL Intravenous Q12H   sucralfate   1 g Oral TID WC   Warfarin - Pharmacist Dosing Inpatient   Does not apply q1600   Continuous Infusions:  cefTRIAXone  (ROCEPHIN )  IV 2 g (12/15/23 0012)     LOS: 4 days      Anthony CHRISTELLA Pouch, MD [facility] Pager [phone]  If 7PM-7AM, please contact night-coverage [email or web address] 12/15/2023, 7:56 AM

## 2023-12-15 NOTE — Consult Note (Signed)
 PHARMACY - ANTICOAGULATION CONSULT NOTE  Pharmacy Consult for warfarin management Indication: DVT  Allergies  Allergen Reactions   Adhesive [Tape] Other (See Comments)    Pt reports, when removing tape from skin most tapes pull her skin off. Ok to use paper tape   Avapro  [Irbesartan ] Anaphylaxis    Facial swelling   Morphine  And Codeine  Nausea And Vomiting   Other Hives    Chlorhexadine wipes/CHG wipes,    Latex Rash    Exam gloves/ does not react around elastic and lips don't swell when blowing balloons    Patient Measurements: Height: 5' 3 (160 cm) IBW/kg (Calculated) : 52.4  Vital Signs: Temp: 98.4 F (36.9 C) (07/24 0743) BP: 107/51 (07/24 0743) Pulse Rate: 101 (07/24 0743)  Labs: Recent Labs    12/13/23 0327 12/14/23 0503 12/15/23 0526  HGB 10.7*  --  10.5*  HCT 32.7*  --  32.9*  PLT 87*  --  104*  LABPROT 29.5* 25.0* 22.9*  INR 2.6* 2.1* 1.9*  CREATININE 0.64  --  0.52    Estimated Creatinine Clearance: 65.6 mL/min (by C-G formula based on SCr of 0.52 mg/dL).   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, 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   CHF (congestive heart failure) (HCC)    followed colon resection, wouldn't let me get up   Chronic anticoagulation    Degenerative disc disease, lumbar    Diverticulitis 2018   Diverticulosis    DVT of lower extremity, bilateral (HCC) 2018   Essential hypertension    History of being hospitalized    1 month ago for 3 days, vomiting, and kink in upper intestine   History of hiatal hernia    Mediastinal adenopathy 04/2023   MRSA nasal colonization    Non-small cell lung cancer (HCC) 2022   right   Obesity (BMI 30-39.9)    Osteoarthritis    Osteoporosis    PE (pulmonary thromboembolism) (HCC) 2018   Pneumonia    Small bowel obstruction (HCC) 03/20/2023   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HCC)    Type 2 diabetes mellitus with obesity  (HCC)    Vertigo     Medications:  Currently on Warfarin 5 mg po daily TWD 35mg  per 7/16 [facility] clinic visit note INR 2.8 TWD 41mg  per 6/24 note INR 3.8 TWD 45mg  per 6/10 note   Assessment: 73 yo presented to ED with complaint of increased shortness of breath.  Was recently hospitalized 7/16-7/18 with pneumonia. During workup patient was also found to have new acute DVT despite being on warfarin.  Pharmacy consulted to manage Warfarin. Patient has required multiple recent decreases in total weekly warfarin dose per recent anticoag clinic visit notes. Provider spoke with patient about possibility of transitioning to Eliquis  and patient wants a day to think it over due to cost issues.  Interacting medication: Azithromycin   Update 7/24: INR now subtherapeutic at 1.9. Will add enoxaparin  bridge.  Goal of Therapy:  INR 2-3 Monitor platelets by anticoagulation protocol: Yes    Date INR Warfarin Dose  7/20 2.9 5 mg (PTA)  7/21 3.0 1.5 mg  7/22 2.6 2.5 mg  7/23 2.1 5 mg  7/24 1.9 6 mg     Plan:  INR now subtherapeutic. Patient has experienced ~20% weekly dose reduction in warfarin. Give warfarin 6mg  PO x 1 today Initiate enoxaparin  1 mg/kg subQ q12H until INR therapeutic Daily  INR while admitted CBC at least every 72 hours   Will M. Lenon, PharmD Clinical Pharmacist 12/15/2023 8:08 AM

## 2023-12-15 NOTE — Plan of Care (Signed)

## 2023-12-16 DIAGNOSIS — C3490 Malignant neoplasm of unspecified part of unspecified bronchus or lung: Secondary | ICD-10-CM | POA: Diagnosis not present

## 2023-12-16 DIAGNOSIS — J449 Chronic obstructive pulmonary disease, unspecified: Secondary | ICD-10-CM | POA: Diagnosis not present

## 2023-12-16 DIAGNOSIS — J9621 Acute and chronic respiratory failure with hypoxia: Secondary | ICD-10-CM | POA: Diagnosis not present

## 2023-12-16 DIAGNOSIS — I5032 Chronic diastolic (congestive) heart failure: Secondary | ICD-10-CM

## 2023-12-16 DIAGNOSIS — J9601 Acute respiratory failure with hypoxia: Secondary | ICD-10-CM | POA: Diagnosis not present

## 2023-12-16 LAB — GLUCOSE, CAPILLARY
Glucose-Capillary: 234 mg/dL — ABNORMAL HIGH (ref 70–99)
Glucose-Capillary: 160 mg/dL — ABNORMAL HIGH (ref 70–99)
Glucose-Capillary: 168 mg/dL — ABNORMAL HIGH (ref 70–99)
Glucose-Capillary: 277 mg/dL — ABNORMAL HIGH (ref 70–99)
Glucose-Capillary: 234 mg/dL — ABNORMAL HIGH (ref 70–99)

## 2023-12-16 LAB — CBC
HCT: 31.7 % — ABNORMAL LOW (ref 36.0–46.0)
Hemoglobin: 10.4 g/dL — ABNORMAL LOW (ref 12.0–15.0)
MCH: 30.4 pg (ref 26.0–34.0)
MCHC: 32.8 g/dL (ref 30.0–36.0)
MCV: 92.7 fL (ref 80.0–100.0)
Platelets: 110 K/uL — ABNORMAL LOW (ref 150–400)
RBC: 3.42 MIL/uL — ABNORMAL LOW (ref 3.87–5.11)
RDW: 13.3 % (ref 11.5–15.5)
WBC: 5.4 K/uL (ref 4.0–10.5)
nRBC: 0 % (ref 0.0–0.2)

## 2023-12-16 LAB — BASIC METABOLIC PANEL WITH GFR
Anion gap: 9 (ref 5–15)
BUN: 13 mg/dL (ref 8–23)
CO2: 27 mmol/L (ref 22–32)
Calcium: 8.6 mg/dL — ABNORMAL LOW (ref 8.9–10.3)
Chloride: 101 mmol/L (ref 98–111)
Creatinine, Ser: 0.69 mg/dL (ref 0.44–1.00)
GFR, Estimated: >60 mL/min (ref >60)
Glucose, Bld: 220 mg/dL — ABNORMAL HIGH (ref 70–99)
Potassium: 4.6 mmol/L (ref 3.5–5.1)
Sodium: 137 mmol/L (ref 135–145)

## 2023-12-16 LAB — CULTURE, BLOOD (ROUTINE X 2)
Culture: NO GROWTH
Special Requests: ADEQUATE

## 2023-12-16 LAB — PROTIME-INR
INR: 1.9 — ABNORMAL HIGH (ref 0.8–1.2)
Prothrombin Time: 23.2 s — ABNORMAL HIGH (ref 11.4–15.2)

## 2023-12-16 MED ORDER — WARFARIN SODIUM 6 MG PO TABS
6.0000 mg | ORAL_TABLET | Freq: Once | ORAL | Status: AC
  Administered 2023-12-16: 6 mg via ORAL
  Filled 2023-12-16: qty 1

## 2023-12-16 MED ORDER — BUDESON-GLYCOPYRROL-FORMOTEROL 160-9-4.8 MCG/ACT IN AERO
2.0000 | INHALATION_SPRAY | Freq: Two times a day (BID) | RESPIRATORY_TRACT | Status: DC
  Administered 2023-12-16 – 2023-12-20 (×7): 2 via RESPIRATORY_TRACT
  Filled 2023-12-16: qty 5.9

## 2023-12-16 NOTE — Progress Notes (Signed)
 PROGRESS NOTE    Kara [name]  FMW:[id number] DOB: June 09, 1950 DOA: 12/11/2023 PCP: Sadie Manna, MD   Assessment & Plan:   Principal Problem:   Hypoxic respiratory failure (HCC) Active Problems:   Diabetic peripheral neuropathy (HCC)   Recurrent non-small cell lung cancer (NSCLC) (HCC)   Uncontrolled type 2 diabetes mellitus with hyperglycemia, without long-term current use of insulin  (HCC)   Chronic anticoagulation (COUMADIN )   Long term current use of opiate analgesic   Benign essential hypertension   Acute deep vein thrombosis (DVT) of distal end of right lower extremity (HCC)   Chronic diastolic (congestive) heart failure (HCC)   Chemotherapy induced neutropenia (HCC)  Assessment and Plan: Acute hypoxic respiratory failure:intermittent O2 desat to 88% while on 2L O2.  CTA neg PE. Completed abx course. Continue on supplemental oxygen and wean as tolerated   Likely pneumonia: completed abx course. Continue on IV steroids, bronchodilators & encourage incentive spirometry. Pt adamantly requests an inpatient pulmon consult, Dr. Fernand was consulted    Chronic pain: of right hip. Continue on home dose of norco, gabapentin     Diabetic peripheral neuropathy: continue on home dose of gabapentin    Recurrent non-small cell lung cancer: continue on home dose of tagrisso     DM2: fair control, HbA1c 7.2. Continue on SSI w/ accuchecks    Hx of PE: continue on home dose of warfarin. Pt wants to think over taking eliquis  instead of warfarin. Pt is taking warfarin b/c it is cheaper     Acute right DVT: etiology unclear. Unclear if pt failed warfarin or if INR was not therapeutic. Continue on warfarin and now lovenox  as INR is subtherapeutic. Pt is taking warfarin b/c it is cheaper. Pt wants to think over taking eliquis  instead of warfarin    Chemotherapy induced pancytopenia: labile. No need for a transfusion currently    Chronic diastolic CHF: appears compensated. Monitor  I/Os  HTN: holding home dose of irbesartan  as BP is low normal    Obesity: BMI 33.1. Would benefit from weight loss       DVT prophylaxis: warfarin, lovenox  Code Status: full  Family Communication: Disposition Plan: likely d/c back home  Level of care: Med-Surg  Status is: Inpatient Remains inpatient appropriate because: severity of illness, requiring IV steroids     Consultants:  Pulmon, Dr. Fernand (requested by pt)  Procedures:   Antimicrobials:    Subjective: Pt c/o wheezing   Objective: Vitals:   12/16/23 0106 12/16/23 0443 12/16/23 0713 12/16/23 0752  BP:  (!) 111/54  124/62  Pulse:  (!) 106  (!) 107  Resp:  17  20  Temp:  98 F (36.7 C)  98.2 F (36.8 C)  TempSrc:      SpO2: 92% 95% 93% 91%  Height:        Intake/Output Summary (Last 24 hours) at 12/16/2023 0850 Last data filed at 12/15/2023 1452 Gross per 24 hour  Intake 240 ml  Output --  Net 240 ml   There were no vitals filed for this visit.  Examination:  General exam: Appears comfortable  Respiratory system: diminished breath sounds b/l. Intermittent wheezes Cardiovascular system: S1 & S2+. No rubs or clicks  Gastrointestinal system: abd is soft, NT, ND & hypoactive bowel sounds Central nervous system: alert & oriented. Moves all extremities  Psychiatry: judgement and insight appears at baseline. Flat mood and affect    Data Reviewed: I have personally reviewed following labs and imaging studies  CBC: Recent Labs  Lab  12/11/23 2106 12/12/23 0425 12/13/23 0327 12/15/23 0526 12/16/23 0341  WBC 3.8* 4.0 5.0 3.8* 5.4  NEUTROABS 2.6  --   --   --   --   HGB 11.2* 10.6* 10.7* 10.5* 10.4*  HCT 33.8* 32.9* 32.7* 32.9* 31.7*  MCV 90.1 94.0 92.4 92.9 92.7  PLT 80* 84* 87* 104* 110*   Basic Metabolic Panel: Recent Labs  Lab 12/11/23 2106 12/12/23 0425 12/13/23 0327 12/15/23 0526 12/16/23 0341  NA 138 138 139 136 137  K 3.5 3.2* 3.9 4.2 4.6  CL 106 103 105 101 101  CO2 23 24  25 26 27   GLUCOSE 144* 99 131* 170* 220*  BUN 11 11 10 10 13   CREATININE 0.74 0.74 0.64 0.52 0.69  CALCIUM  8.5* 8.4* 8.2* 8.2* 8.6*  MG  --   --  2.1  --   --    GFR: Estimated Creatinine Clearance: 65.6 mL/min (by C-G formula based on SCr of 0.69 mg/dL). Liver Function Tests: Recent Labs  Lab 12/11/23 2106  AST 16  ALT 10  ALKPHOS 40  BILITOT 0.6  PROT 7.5  ALBUMIN 2.8*   Recent Labs  Lab 12/11/23 2106  LIPASE 23   No results for input(s): AMMONIA in the last 168 hours. Coagulation Profile: Recent Labs  Lab 12/12/23 0726 12/13/23 0327 12/14/23 0503 12/15/23 0526 12/16/23 0341  INR 3.0* 2.6* 2.1* 1.9* 1.9*   Cardiac Enzymes: No results for input(s): CKTOTAL, CKMB, CKMBINDEX, TROPONINI in the last 168 hours. BNP (last 3 results) No results for input(s): PROBNP in the last 8760 hours. HbA1C: No results for input(s): HGBA1C in the last 72 hours. CBG: Recent Labs  Lab 12/15/23 1155 12/15/23 1633 12/15/23 2058 12/16/23 0107 12/16/23 0752  GLUCAP 172* 268* 276* 234* 160*   Lipid Profile: No results for input(s): CHOL, HDL, LDLCALC, TRIG, CHOLHDL, LDLDIRECT in the last 72 hours. Thyroid  Function Tests: No results for input(s): TSH, T4TOTAL, FREET4, T3FREE, THYROIDAB in the last 72 hours. Anemia Panel: No results for input(s): VITAMINB12, FOLATE, FERRITIN, TIBC, IRON, RETICCTPCT in the last 72 hours. Sepsis Labs: Recent Labs  Lab 12/11/23 2106 12/11/23 2309 12/11/23 2310  PROCALCITON 5.22 5.18  --   LATICACIDVEN 1.0  --  0.7    Recent Results (from the past 240 hours)  Blood culture (routine x 2)     Status: None   Collection Time: 12/07/23 10:41 PM   Specimen: BLOOD  Result Value Ref Range Status   Specimen Description BLOOD BLOOD RIGHT ARM  Final   Special Requests   Final    BOTTLES DRAWN AEROBIC AND ANAEROBIC Blood Culture adequate volume   Culture   Final    NO GROWTH 5 DAYS Performed at  [facility], [address] [place], [place] [address]    Report Status 12/12/2023 FINAL  Final  Blood culture (routine x 2)     Status: None   Collection Time: 12/07/23 10:41 PM   Specimen: BLOOD  Result Value Ref Range Status   Specimen Description BLOOD BLOOD LEFT FOREARM  Final   Special Requests   Final    BOTTLES DRAWN AEROBIC AND ANAEROBIC Blood Culture results may not be optimal due to an excessive volume of blood received in culture bottles   Culture   Final    NO GROWTH 5 DAYS Performed at [facility], [address], [place], [place] [address]    Report Status 12/12/2023 FINAL  Final  Urine Culture (for pregnant, neutropenic or  urologic patients or patients with an indwelling urinary catheter)     Status: Abnormal   Collection Time: 12/08/23  1:32 AM   Specimen: Urine, Clean Catch  Result Value Ref Range Status   Specimen Description   Final    URINE, CLEAN CATCH Performed at [facility], [address], [place], [place] [address]    Special Requests   Final    NONE Performed at [facility], [address] [place], [place] [address]    Culture 50,000 COLONIES/mL KLEBSIELLA PNEUMONIAE (A)  Final   Report Status 12/10/2023 FINAL  Final   Organism ID, Bacteria KLEBSIELLA PNEUMONIAE (A)  Final      Susceptibility   Klebsiella pneumoniae - MIC*    AMPICILLIN RESISTANT Resistant     CEFAZOLIN  <=4 SENSITIVE Sensitive     CEFEPIME  <=0.12 SENSITIVE Sensitive     CEFTRIAXONE  <=0.25 SENSITIVE Sensitive     CIPROFLOXACIN  <=0.25 SENSITIVE Sensitive     GENTAMICIN <=1 SENSITIVE Sensitive     IMIPENEM <=0.25 SENSITIVE Sensitive     NITROFURANTOIN <=16 SENSITIVE Sensitive     TRIMETH/SULFA <=20 SENSITIVE Sensitive     AMPICILLIN/SULBACTAM <=2 SENSITIVE Sensitive     PIP/TAZO <=4 SENSITIVE Sensitive ug/mL    * 50,000 COLONIES/mL KLEBSIELLA PNEUMONIAE  SARS Coronavirus 2 by RT PCR (hospital order, performed in [facility] hospital  lab) *cepheid single result test* Anterior Nasal Swab     Status: None   Collection Time: 12/08/23 10:26 AM   Specimen: Anterior Nasal Swab  Result Value Ref Range Status   SARS Coronavirus 2 by RT PCR NEGATIVE NEGATIVE Final    Comment: (NOTE) SARS-CoV-2 target nucleic acids are NOT DETECTED.  The SARS-CoV-2 RNA is generally detectable in upper and lower respiratory specimens during the acute phase of infection. The lowest concentration of SARS-CoV-2 viral copies this assay can detect is 250 copies / mL. A negative result does not preclude SARS-CoV-2 infection and should not be used as the sole basis for treatment or other patient management decisions.  A negative result may occur with improper specimen collection / handling, submission of specimen other than nasopharyngeal swab, presence of viral mutation(s) within the areas targeted by this assay, and inadequate number of viral copies (<250 copies / mL). A negative result must be combined with clinical observations, patient history, and epidemiological information.  Fact Sheet for Patients:   RoadLapTop.co.za  Fact Sheet for Healthcare Providers: http://kim-miller.com/  This test is not yet approved or  cleared by the [place]  FDA and has been authorized for detection and/or diagnosis of SARS-CoV-2 by FDA under an Emergency Use Authorization (EUA).  This EUA will remain in effect (meaning this test can be used) for the duration of the COVID-19 declaration under Section 564(b)(1) of the Act, 21 U.S.C. section 360bbb-3(b)(1), unless the authorization is terminated or revoked sooner.  Performed at [facility], [address] [place], [place] [address]   Respiratory (~20 pathogens) panel by PCR     Status: None   Collection Time: 12/08/23 11:23 AM   Specimen: Nasopharyngeal Swab; Respiratory  Result Value Ref Range Status   Adenovirus NOT DETECTED NOT DETECTED Final    Coronavirus 229E NOT DETECTED NOT DETECTED Final    Comment: (NOTE) The Coronavirus on the Respiratory Panel, DOES NOT test for the novel  Coronavirus (2019 nCoV)    Coronavirus HKU1 NOT DETECTED NOT DETECTED Final   Coronavirus NL63 NOT DETECTED NOT DETECTED Final   Coronavirus OC43 NOT DETECTED  NOT DETECTED Final   Metapneumovirus NOT DETECTED NOT DETECTED Final   Rhinovirus / Enterovirus NOT DETECTED NOT DETECTED Final   Influenza A NOT DETECTED NOT DETECTED Final   Influenza B NOT DETECTED NOT DETECTED Final   Parainfluenza Virus 1 NOT DETECTED NOT DETECTED Final   Parainfluenza Virus 2 NOT DETECTED NOT DETECTED Final   Parainfluenza Virus 3 NOT DETECTED NOT DETECTED Final   Parainfluenza Virus 4 NOT DETECTED NOT DETECTED Final   Respiratory Syncytial Virus NOT DETECTED NOT DETECTED Final   Bordetella pertussis NOT DETECTED NOT DETECTED Final   Bordetella Parapertussis NOT DETECTED NOT DETECTED Final   Chlamydophila pneumoniae NOT DETECTED NOT DETECTED Final   Mycoplasma pneumoniae NOT DETECTED NOT DETECTED Final    Comment: Performed at [facility], [address], [place], [place] [address]  Blood culture (routine x 2)     Status: None   Collection Time: 12/11/23  9:06 PM   Specimen: BLOOD  Result Value Ref Range Status   Specimen Description BLOOD RIGHT [facility]  Final   Special Requests   Final    BOTTLES DRAWN AEROBIC AND ANAEROBIC Blood Culture adequate volume   Culture   Final    NO GROWTH 5 DAYS Performed at [facility], [address] [place], [place] [address]    Report Status 12/16/2023 FINAL  Final  Blood culture (routine x 2)     Status: None (Preliminary result)   Collection Time: 12/12/23  2:37 AM   Specimen: BLOOD  Result Value Ref Range Status   Specimen Description BLOOD BLOOD RIGHT ARM  Final   Special Requests   Final    BOTTLES DRAWN AEROBIC AND ANAEROBIC Blood Culture results may not be optimal due to an inadequate volume of blood  received in culture bottles   Culture   Final    NO GROWTH 4 DAYS Performed at [facility], [address], [place], [place] [address]    Report Status PENDING  Incomplete  Urine Culture     Status: None   Collection Time: 12/12/23  9:00 AM   Specimen: Urine, Clean Catch  Result Value Ref Range Status   Specimen Description   Final    URINE, CLEAN CATCH Performed at [facility], [address], [place], [place] [address]    Special Requests   Final    NONE Reflexed from [id number] Performed at [facility], [address] [place] [place], [place] [address]    Culture   Final    NO GROWTH Performed at [facility], [address], [place], [place] [address]    Report Status 12/13/2023 FINAL  Final         Radiology Studies: No results found.      Scheduled Meds:  albuterol   2.5 mg Nebulization Q6H   cholecalciferol   1,000 Units Oral Daily   cyanocobalamin   5,000 mcg Oral Daily   cyclobenzaprine   10 mg Oral QHS   enoxaparin  (LOVENOX ) injection  1 mg/kg Subcutaneous Q12H   feeding supplement  237 mL Oral BID BM   gabapentin   200 mg Oral TID with meals   gabapentin   300 mg Oral QHS   insulin  aspart  0-15 Units Subcutaneous TID WC   insulin  aspart  0-5 Units Subcutaneous QHS   magic mouthwash w/lidocaine   15 mL Oral TID   magnesium  oxide  400 mg Oral QHS   methylPREDNISolone  (SOLU-MEDROL ) injection  40 mg Intravenous Q24H   osimertinib  mesylate  80 mg Oral Daily   oxybutynin   5 mg Oral BID   pantoprazole   40 mg Oral Daily   potassium chloride  SA  20 mEq Oral Daily   rOPINIRole   1 mg Oral QHS   sodium chloride  flush  3 mL Intravenous Q12H   sucralfate   1 g Oral TID WC   warfarin  6 mg Oral ONCE-1600   Warfarin - Pharmacist Dosing Inpatient   Does not apply q1600   Continuous Infusions:     LOS: 5 days      Anthony CHRISTELLA Pouch, MD [facility] Pager [phone]  If 7PM-7AM, please contact  night-coverage [email or web address] 12/16/2023, 8:50 AM

## 2023-12-16 NOTE — Consult Note (Signed)
 Pulmonary Critical Care  Initial Consult Note  Kara [name] FMW:[id number] DOB: 1950-11-28 DOA: 12/11/2023  Referring physician: Dr. Trudy  Chief Complaint: Difficulty breathing  HPI: Kara [name] is a 73 y.o. female with multiple medical problems including history of hypertension diabetes pulmonary embolism non-small cell cancer of the lung currently being followed by oncology.  Came into the hospital this time around with what appears to be a diagnosis of pneumonia.  The patient has been treated with antibiotics clinically appears to be slowly improving.  She felt though that she is just not getting better and she wanted to be seen by pulmonary.  I was called by the hospitalist to come and see the patient today and upon evaluation she appears to be comfortable.  She states that she is having difficulty keeping her breath.  She has been on oxygen but she states that she was not on oxygen at home.  Radiologically she had workup done and that shows the presence of her lung mass which is known and there was some infiltrates noted also.  Review of Systems:  Constitutional:  No weight loss, night sweats, Fevers, chills, fatigue.  HEENT:  No headaches, nasal congestion, post nasal drip,  Cardio-vascular:  No chest pain, Orthopnea, PND, swelling in lower extremities, anasarca, dizziness, palpitations  GI:  No heartburn, indigestion, abdominal pain, nausea, vomiting, diarrhea  Resp:  No shortness of breath with exertion or at rest. no productive cough, No coughing up of blood.No wheezing Skin:  no rash or lesions.  Musculoskeletal:  No joint pain or swelling.   Remainder ROS performed and is unremarkable other than noted in HPI  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, 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   CHF (congestive heart failure) (HCC)    followed colon resection, wouldn't let me get up   Chronic  anticoagulation    Degenerative disc disease, lumbar    Diverticulitis 2018   Diverticulosis    DVT of lower extremity, bilateral (HCC) 2018   Essential hypertension    History of being hospitalized    1 month ago for 3 days, vomiting, and kink in upper intestine   History of hiatal hernia    Mediastinal adenopathy 04/2023   MRSA nasal colonization    Non-small cell lung cancer (HCC) 2022   right   Obesity (BMI 30-39.9)    Osteoarthritis    Osteoporosis    PE (pulmonary thromboembolism) (HCC) 2018   Pneumonia    Small bowel obstruction (HCC) 03/20/2023   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HCC)    Type 2 diabetes mellitus with obesity (HCC)    Vertigo    Past Surgical History:  Procedure Laterality Date   APPENDECTOMY N/A 02/24/2017   Procedure: Incidental  APPENDECTOMY;  Surgeon: Shelva Dunnings, MD;  Location: [name];  Service: General;  Laterality: N/A;   CARPAL TUNNEL RELEASE Bilateral    CATARACT EXTRACTION W/PHACO Left 11/21/2018   Procedure: CATARACT EXTRACTION PHACO AND INTRAOCULAR LENS PLACEMENT (IOC) LEFT DIABETES;  Surgeon: Jaye Fallow, MD;  Location: [facility];  Service: Ophthalmology;  Laterality: Left;  latex sensitivity Diabetic - oral meds   CATARACT EXTRACTION W/PHACO Right 12/12/2018   Procedure: CATARACT EXTRACTION PHACO AND INTRAOCULAR LENS PLACEMENT (IOC) RIGHT DIABETES;  Surgeon: Jaye Fallow, MD;  Location: [facility];  Service: Ophthalmology;  Laterality: Right;  Diabetes-oral med Latex sensitiviy   COLON RESECTION SIGMOID N/A  11/02/2016   Procedure: COLON RESECTION SIGMOID;  Surgeon: Shelva Dunnings, MD;  Location: [name];  Service: General;  Laterality: N/A;   COLON SURGERY  11/02/2016   COLONOSCOPY WITH PROPOFOL  N/A 02/03/2017   Procedure: COLONOSCOPY WITH PROPOFOL ;  Surgeon: Unk Corinn Skiff, MD;  Location: [facility];  Service: Gastroenterology;  Laterality: N/A;   COLOSTOMY N/A 11/02/2016   Procedure:  COLOSTOMY;  Surgeon: Shelva Dunnings, MD;  Location: [name];  Service: General;  Laterality: N/A;   COLOSTOMY REVERSAL N/A 02/24/2017   Procedure: COLOSTOMY REVERSAL, ostomy takedown, spleenic flexure mobilization, excision rectal stump/distal sigmoid, anastomosis with suture reinforcement;  Surgeon: Shelva Dunnings, MD;  Location: [name];  Service: General;  Laterality: N/A;   ENDOBRONCHIAL ULTRASOUND N/A 05/16/2023   Procedure: ENDOBRONCHIAL ULTRASOUND;  Surgeon: Tamea Dedra CROME, MD;  Location: [name];  Service: Cardiopulmonary;  Laterality: N/A;   FLEXIBLE BRONCHOSCOPY N/A 05/16/2023   Procedure: FLEXIBLE BRONCHOSCOPY;  Surgeon: Tamea Dedra CROME, MD;  Location: [name];  Service: Cardiopulmonary;  Laterality: N/A;   ILEO LOOP DIVERSION N/A 02/24/2017   Procedure: ILEO LOOP COLOSTOMY;  Surgeon: Shelva Dunnings, MD;  Location: [name];  Service: General;  Laterality: N/A;   ILEOSTOMY CLOSURE N/A 06/01/2017   Procedure: LOOP ILEOSTOMY TAKEDOWN;  Surgeon: Shelva Dunnings, MD;  Location: [name];  Service: General;  Laterality: N/A;   IR IMAGING GUIDED PORT INSERTION  06/14/2023   IR REMOVAL TUN ACCESS W/ PORT W/O FL MOD SED  11/16/2023   IVC FILTER INSERTION Right 10/2016   IVC FILTER REMOVAL N/A 08/09/2017   Procedure: IVC FILTER REMOVAL;  Surgeon: Jama Cordella MATSU, MD;  Location: [name];  Service: Cardiovascular;  Laterality: N/A;   KNEE SURGERY Right    torn menicus   LAPAROSCOPY N/A 02/24/2017   Procedure: LAPAROSCOPY DIAGNOSTIC;  Surgeon: Shelva Dunnings, MD;  Location: [name];  Service: General;  Laterality: N/A;   LYSIS OF ADHESION N/A 02/24/2017   Procedure: LYSIS OF ADHESION;  Surgeon: Shelva Dunnings, MD;  Location: [name];  Service: General;  Laterality: N/A;   PULMONARY VENOGRAPHY N/A 11/19/2016   Procedure: Pulmonary Venography; IVC filter placement; possible pulmonary thrombectomy;  Surgeon: Jama Cordella MATSU, MD;  Location: [name];  Service: Cardiovascular;  Laterality: N/A;   SACROPLASTY N/A 11/08/2017   Procedure: SACROPLASTY S2;  Surgeon: Kathlynn Sharper, MD;  Location: [name];  Service: Orthopedics;  Laterality: N/A;   SIGMOIDOSCOPY N/A 11/13/2016   Procedure: endoscopic  flexible SIGMOIDOSCOPY;  Surgeon: Wonda Charlie BRAVO, MD;  Location: [name];  Service: General;  Laterality: N/A;   SIGMOIDOSCOPY N/A 05/19/2017   Procedure: ENID MORIN;  Surgeon: Shelva Dunnings, MD;  Location: [name];  Service: General;  Laterality: N/A;   VIDEO BRONCHOSCOPY WITH ENDOBRONCHIAL NAVIGATION N/A 02/23/2021   Procedure: ROBOTIC VIDEO BRONCHOSCOPY WITH ENDOBRONCHIAL NAVIGATION;  Surgeon: Tamea Dedra CROME, MD;  Location: [name];  Service: Pulmonary;  Laterality: N/A;   Social History:  reports that she has never smoked. She has been exposed to tobacco smoke. She has never used smokeless tobacco. She reports that she does not drink alcohol  and does not use drugs.  Allergies  Allergen Reactions   Adhesive [Tape] Other (See Comments)    Pt reports, when removing tape from skin most tapes pull her skin off. Ok to use paper tape   Avapro  [Irbesartan ] Anaphylaxis    Facial swelling   Morphine  And Codeine  Nausea And Vomiting   Other Hives    Chlorhexadine wipes/CHG wipes,  Latex Rash    Exam gloves/ does not react around elastic and lips don't swell when blowing balloons    Family History  Problem Relation Age of Onset   Diabetes Mother    Heart disease Mother    Cancer Mother    Breast cancer Mother        >50   Heart disease Father    Diabetes Sister    Heart disease Sister     Prior to Admission medications   Medication Sig Start Date End Date Taking? Authorizing Provider  candesartan (ATACAND) 16 MG tablet Take 16 mg by mouth in the morning. 08/03/17  Yes [provider]  cholecalciferol  (VITAMIN D ) 25 MCG (1000 UNIT) tablet Take 1,000 Units by mouth in the morning.   Yes [provider]  Cyanocobalamin  (VITAMIN B-12) 5000 MCG TBDP Take 5,000 mcg by mouth in the morning.   Yes [provider]  cyclobenzaprine  (FLEXERIL ) 10 MG tablet Take 10 mg by mouth at bedtime. 07/05/16  Yes [provider]  Dulaglutide (TRULICITY) 1.5 MG/0.5ML SOAJ Inject 1.5 mg into the skin once a week. Saturday 07/28/23 07/27/24 Yes [provider]  gabapentin  (NEURONTIN ) 100 MG capsule Take 200 mg by mouth with breakfast, with lunch, and with evening meal.   Yes [provider]  gabapentin  (NEURONTIN ) 300 MG capsule Take 300 mg by mouth at bedtime.   Yes [provider]  glipiZIDE  (GLUCOTROL ) 10 MG tablet Take 10 mg by mouth daily before breakfast.   Yes [provider]  Magnesium  Oxide -Mg Supplement 500 MG TABS Take 1 tablet by mouth at bedtime.   Yes [provider]  omeprazole  (PRILOSEC) 20 MG capsule Take 1 capsule (20 mg total) by mouth daily. 08/30/23  Yes Dasie Tinnie MATSU, NP  ondansetron  (ZOFRAN ) 8 MG tablet TAKE 1 TABLET BY MOUTH EVERY 8 HOURS AS NEEDED FOR NAUSEA OR VOMITING. START ON THE 3RD DAY AFTER CHEMOTHERAPY 08/15/23  Yes Melanee Annah BROCKS, MD  osimertinib  mesylate (TAGRISSO ) 80 MG tablet Take 1 tablet (80 mg total) by mouth daily. 11/17/23  Yes Melanee Annah BROCKS, MD  oxybutynin  (DITROPAN ) 5 MG tablet Take 1 tablet by mouth 2 (two) times daily. 02/25/22  Yes [provider]  prochlorperazine  (COMPAZINE ) 10 MG tablet Take 1 tablet (10 mg total) by mouth every 6 (six) hours as needed for nausea or vomiting. 08/30/23  Yes Dasie Tinnie MATSU, NP  rOPINIRole  (REQUIP ) 1 MG tablet Take 1 mg by mouth at bedtime. 06/22/23  Yes [provider]  warfarin (COUMADIN ) 5 MG tablet Take 5 mg by mouth daily. 06/01/23  Yes [provider]  bisacodyl  (DULCOLAX) 10 MG suppository Place 1 suppository (10 mg total) rectally as needed for moderate constipation. Patient not taking: Reported on 12/08/2023 07/06/23   Viviann Pastor, MD   HYDROcodone -acetaminophen  (NORCO/VICODIN) 5-325 MG tablet Take 1 tablet by mouth every 6 (six) hours as needed for moderate pain. Patient not taking: Reported on 12/12/2023    [provider]  [facility] ULTRA TEST test strip  01/25/23   [provider]  potassium chloride  SA (KLOR-CON  M) 20 MEQ tablet Take 1 tablet (20 mEq total) by mouth daily. Patient not taking: Reported on 12/12/2023 07/22/23   Melanee Annah BROCKS, MD  sucralfate  (CARAFATE ) 1 g tablet Take 1 tablet (1 g total) by mouth 3 (three) times daily with meals. Dissolve tablet in 4 tablesppons warm water swish and swallow 30 min before meals. Patient taking differently: Take 1 g  by mouth 3 (three) times daily with meals. Dissolve tablet in 4 tablespoons warm water swish and swallow 30 min before meals. 07/22/23 12/08/23  Lenn Aran, MD   Physical Exam: Vitals:   12/16/23 0713 12/16/23 0752 12/16/23 1344 12/16/23 1600  BP:  124/62  124/67  Pulse:  (!) 107  (!) 105  Resp:  20    Temp:  98.2 F (36.8 C)  97.8 F (36.6 C)  TempSrc:      SpO2: 93% 91% 90% 91%  Height:       Body mass index is 33.13 kg/m.   Wt Readings from Last 3 Encounters:  12/08/23 84.8 kg  11/29/23 84.8 kg  11/02/23 84.8 kg    General:  Appears calm and comfortable Eyes: PERRL, normal lids, irises & conjunctiva ENT: grossly normal hearing, lips & tongue Neck: no LAD, masses or thyromegaly Cardiovascular: RRR, no m/r/g. No LE edema. Respiratory: CTA bilaterally, no w/r/r.       Normal respiratory effort. Abdomen: soft, nontender Skin: no rash or induration seen on limited exam Musculoskeletal: grossly normal tone BUE/BLE Psychiatric: grossly normal mood and affect Neurologic: grossly non-focal.          Labs on Admission:  Basic Metabolic Panel: Recent Labs  Lab 12/11/23 2106 12/12/23 0425 12/13/23 0327 12/15/23 0526 12/16/23 0341  NA 138 138 139 136 137  K 3.5 3.2* 3.9 4.2 4.6  CL 106 103 105 101 101  CO2 23 24 25 26 27    GLUCOSE 144* 99 131* 170* 220*  BUN 11 11 10 10 13   CREATININE 0.74 0.74 0.64 0.52 0.69  CALCIUM  8.5* 8.4* 8.2* 8.2* 8.6*  MG  --   --  2.1  --   --    Liver Function Tests: Recent Labs  Lab 12/11/23 2106  AST 16  ALT 10  ALKPHOS 40  BILITOT 0.6  PROT 7.5  ALBUMIN 2.8*   Recent Labs  Lab 12/11/23 2106  LIPASE 23   No results for input(s): AMMONIA in the last 168 hours. CBC: Recent Labs  Lab 12/11/23 2106 12/12/23 0425 12/13/23 0327 12/15/23 0526 12/16/23 0341  WBC 3.8* 4.0 5.0 3.8* 5.4  NEUTROABS 2.6  --   --   --   --   HGB 11.2* 10.6* 10.7* 10.5* 10.4*  HCT 33.8* 32.9* 32.7* 32.9* 31.7*  MCV 90.1 94.0 92.4 92.9 92.7  PLT 80* 84* 87* 104* 110*   Cardiac Enzymes: No results for input(s): CKTOTAL, CKMB, CKMBINDEX, TROPONINI in the last 168 hours.  BNP (last 3 results) Recent Labs    05/09/23 1252 12/11/23 2106  BNP 19.5 96.8    ProBNP (last 3 results) No results for input(s): PROBNP in the last 8760 hours.  CBG: Recent Labs  Lab 12/15/23 2058 12/16/23 0107 12/16/23 0752 12/16/23 1132 12/16/23 1624  GLUCAP 276* 234* 160* 168* 277*    Radiological Exams on Admission: No results found.  EKG: Independently reviewed.  Assessment/Plan Principal Problem:   Hypoxic respiratory failure (HCC) Active Problems:   Diabetic peripheral neuropathy (HCC)   Recurrent non-small cell lung cancer (NSCLC) (HCC)   Uncontrolled type 2 diabetes mellitus with hyperglycemia, without long-term current use of insulin  (HCC)   Chronic anticoagulation (COUMADIN )   Long term current use of opiate analgesic   Benign essential hypertension   Acute deep vein thrombosis (DVT) of distal end of right lower extremity (HCC)   Chronic diastolic (congestive) heart failure (HCC)   Chemotherapy induced neutropenia (HCC)   Chronic respiratory  failure with hypoxia acute exacerbation at this time she is on oxygen.  Will try to wean the FiO2 down per respiratory  management keep the saturations greater than 88 to 90%.  She may need to qualify for oxygen upon discharge.  On discharge please call my office I would be happy to see her and follow-up for pulmonary care Severe COPD she states that she has never seen a pulmonologist.  I think that we can follow her up in the office and review her respiratory issues probably get pulmonary functions which can be done as an outpatient.  Her current states she is on steroids apparently on Solu-Medrol  40 mg.  In addition to that she is on albuterol  nebulizer solution.  I will take the liberty of going ahead and starting her on long-acting beta agonist combination therapy.  She probably would benefit from anticholinergic therapy also unless there is a distinct contraindication. Chronic heart failure with diastolic dysfunction would monitor patient's fluid status closely make certain that she is euvolemic. Non-small cell cancer of the lung she has a history she is followed by Dr. Melanee continue follow-up with oncology after she is discharged.  Code Status: Apparently full code  Family Communication: No family present in the room Disposition Plan: Will be home when she is ready  Time spent: 66  I have personally obtained a history, examined the patient, evaluated laboratory and imaging results, formulated the assessment and plan and placed orders.  The Patient requires high complexity decision making for assessment and support. Total Time Spent   Elfreda DELENA Bathe, MD [facility] Pulmonary Critical Care Medicine Sleep Medicine

## 2023-12-16 NOTE — Inpatient Diabetes Management (Signed)
 Inpatient Diabetes Program Recommendations  AACE/ADA: New Consensus Statement on Inpatient Glycemic Control (2015)  Target Ranges:  Prepandial:   less than 140 mg/dL      Peak postprandial:   less than 180 mg/dL (1-2 hours)      Critically ill patients:  140 - 180 mg/dL   Lab Results  Component Value Date   GLUCAP 160 (H) 12/16/2023   HGBA1C 7.2 (H) 12/08/2023    Review of Glycemic Control  Latest Reference Range & Units 12/15/23 11:55 12/15/23 16:33 12/15/23 20:58 12/16/23 01:07 12/16/23 07:52  Glucose-Capillary 70 - 99 mg/dL 827 (H) 731 (H) 723 (H) 234 (H) 160 (H)   Diabetes history: DM 2 Outpatient Diabetes medications:  Trulicity 1.5 mg weekly Glucotrol  10 mg daily Current orders for Inpatient glycemic control:  Novolog  0-15 units tid with meals and HS Solumedrol 40 mg IV q 24 hours  Inpatient Diabetes Program Recommendations:    While on steroids, consider adding Novolog  meal coverage 2-3 units tid with meals.   Thanks,  Randall Bullocks, RN, BC-ADM Inpatient Diabetes Coordinator Pager [phone]  (8a-5p)

## 2023-12-16 NOTE — Plan of Care (Signed)

## 2023-12-16 NOTE — Consult Note (Addendum)
 PHARMACY - ANTICOAGULATION CONSULT NOTE  Pharmacy Consult for warfarin management Indication: DVT  Allergies  Allergen Reactions   Adhesive [Tape] Other (See Comments)    Pt reports, when removing tape from skin most tapes pull her skin off. Ok to use paper tape   Avapro  [Irbesartan ] Anaphylaxis    Facial swelling   Morphine  And Codeine  Nausea And Vomiting   Other Hives    Chlorhexadine wipes/CHG wipes,    Latex Rash    Exam gloves/ does not react around elastic and lips don't swell when blowing balloons    Patient Measurements: Height: 5' 3 (160 cm) IBW/kg (Calculated) : 52.4  Vital Signs: Temp: 98 F (36.7 C) (07/25 0443) BP: 111/54 (07/25 0443) Pulse Rate: 106 (07/25 0443)  Labs: Recent Labs    12/14/23 0503 12/15/23 0526 12/16/23 0341  HGB  --  10.5* 10.4*  HCT  --  32.9* 31.7*  PLT  --  104* 110*  LABPROT 25.0* 22.9* 23.2*  INR 2.1* 1.9* 1.9*  CREATININE  --  0.52 0.69    Estimated Creatinine Clearance: 65.6 mL/min (by C-G formula based on SCr of 0.69 mg/dL).   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, 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   CHF (congestive heart failure) (HCC)    followed colon resection, wouldn't let me get up   Chronic anticoagulation    Degenerative disc disease, lumbar    Diverticulitis 2018   Diverticulosis    DVT of lower extremity, bilateral (HCC) 2018   Essential hypertension    History of being hospitalized    1 month ago for 3 days, vomiting, and kink in upper intestine   History of hiatal hernia    Mediastinal adenopathy 04/2023   MRSA nasal colonization    Non-small cell lung cancer (HCC) 2022   right   Obesity (BMI 30-39.9)    Osteoarthritis    Osteoporosis    PE (pulmonary thromboembolism) (HCC) 2018   Pneumonia    Small bowel obstruction (HCC) 03/20/2023   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HCC)    Type 2 diabetes mellitus with obesity  (HCC)    Vertigo     Medications:  Currently on Warfarin 5 mg po daily (TWD 35mg ) Multiple recent weekly warfarin dose decreases since 10/2023 TWD 35mg  per 7/16 [facility] clinic visit note (current regimen) INR 2.8 TWD 41mg  per 6/24 note INR 3.8 TWD 45mg  per 6/10 note   Assessment: 73 yo presented to ED with complaint of increased shortness of breath.  Was recently hospitalized 7/16-7/18 with pneumonia. During workup patient was also found to have new acute DVT despite being on warfarin.  Pharmacy consulted to manage Warfarin. Patient has required multiple recent decreases in total weekly warfarin dose per recent anticoag clinic visit notes. Provider spoke with patient about possibility of transitioning to Eliquis  and patient wants a day to think it over due to cost issues.  Interacting medication: Azithromycin   Update 7/24: INR now subtherapeutic at 1.9. Will add enoxaparin  bridge.  Goal of Therapy:  INR 2-3 Monitor platelets by anticoagulation protocol: Yes    Date INR Warfarin Dose  7/20 2.9 5 mg (PTA)  7/21 3.0 1.5 mg  7/22 2.6 2.5 mg  7/23 2.1 5 mg  7/24 1.9 6 mg  7/25 1.9      Plan:  INR still subtherapeutic but same as yesterday, so downward trend has stopped. Patient  has experienced ~20% weekly dose reduction in warfarin. Give warfarin 6mg  PO x 1 today Continue enoxaparin  1 mg/kg subQ q12H until INR therapeutic Daily INR while admitted CBC at least every 72 hours   Will M. Lenon, PharmD Clinical Pharmacist 12/16/2023 7:20 AM

## 2023-12-17 DIAGNOSIS — J9601 Acute respiratory failure with hypoxia: Secondary | ICD-10-CM | POA: Diagnosis not present

## 2023-12-17 LAB — BASIC METABOLIC PANEL WITH GFR
Anion gap: 5 (ref 5–15)
BUN: 18 mg/dL (ref 8–23)
CO2: 27 mmol/L (ref 22–32)
Calcium: 8.6 mg/dL — ABNORMAL LOW (ref 8.9–10.3)
Chloride: 101 mmol/L (ref 98–111)
Creatinine, Ser: 0.74 mg/dL (ref 0.44–1.00)
GFR, Estimated: >60 mL/min (ref >60)
Glucose, Bld: 227 mg/dL — ABNORMAL HIGH (ref 70–99)
Potassium: 4.2 mmol/L (ref 3.5–5.1)
Sodium: 133 mmol/L — ABNORMAL LOW (ref 135–145)

## 2023-12-17 LAB — GLUCOSE, CAPILLARY
Glucose-Capillary: 193 mg/dL — ABNORMAL HIGH (ref 70–99)
Glucose-Capillary: 207 mg/dL — ABNORMAL HIGH (ref 70–99)
Glucose-Capillary: 304 mg/dL — ABNORMAL HIGH (ref 70–99)
Glucose-Capillary: 354 mg/dL — ABNORMAL HIGH (ref 70–99)

## 2023-12-17 LAB — CBC
HCT: 32.8 % — ABNORMAL LOW (ref 36.0–46.0)
Hemoglobin: 10.3 g/dL — ABNORMAL LOW (ref 12.0–15.0)
MCH: 29.3 pg (ref 26.0–34.0)
MCHC: 31.4 g/dL (ref 30.0–36.0)
MCV: 93.2 fL (ref 80.0–100.0)
Platelets: 129 K/uL — ABNORMAL LOW (ref 150–400)
RBC: 3.52 MIL/uL — ABNORMAL LOW (ref 3.87–5.11)
RDW: 13.6 % (ref 11.5–15.5)
WBC: 6.3 K/uL (ref 4.0–10.5)
nRBC: 0 % (ref 0.0–0.2)

## 2023-12-17 LAB — CULTURE, BLOOD (ROUTINE X 2): Culture: NO GROWTH

## 2023-12-17 LAB — PROTIME-INR
INR: 2.2 — ABNORMAL HIGH (ref 0.8–1.2)
Prothrombin Time: 25.5 s — ABNORMAL HIGH (ref 11.4–15.2)

## 2023-12-17 MED ORDER — WARFARIN SODIUM 5 MG PO TABS
5.0000 mg | ORAL_TABLET | Freq: Once | ORAL | Status: AC
  Administered 2023-12-17: 5 mg via ORAL
  Filled 2023-12-17: qty 1

## 2023-12-17 MED ORDER — ZINC OXIDE 20 % EX OINT
TOPICAL_OINTMENT | CUTANEOUS | Status: DC | PRN
  Filled 2023-12-17 (×2): qty 1

## 2023-12-17 NOTE — Consult Note (Signed)
 PHARMACY - ANTICOAGULATION CONSULT NOTE  Pharmacy Consult for warfarin management Indication: DVT  Allergies  Allergen Reactions   Adhesive [Tape] Other (See Comments)    Pt reports, when removing tape from skin most tapes pull her skin off. Ok to use paper tape   Avapro  [Irbesartan ] Anaphylaxis    Facial swelling   Morphine  And Codeine  Nausea And Vomiting   Other Hives    Chlorhexadine wipes/CHG wipes,    Latex Rash    Exam gloves/ does not react around elastic and lips don't swell when blowing balloons    Patient Measurements: Height: 5' 3 (160 cm) IBW/kg (Calculated) : 52.4  Vital Signs: Temp: 98.1 F (36.7 C) (07/26 0802) Temp Source: Oral (07/26 0802) BP: 106/41 (07/26 0802) Pulse Rate: 105 (07/26 0802)  Labs: Recent Labs    12/15/23 0526 12/16/23 0341 12/17/23 0306  HGB 10.5* 10.4* 10.3*  HCT 32.9* 31.7* 32.8*  PLT 104* 110* 129*  LABPROT 22.9* 23.2* 25.5*  INR 1.9* 1.9* 2.2*  CREATININE 0.52 0.69 0.74    Estimated Creatinine Clearance: 65.6 mL/min (by C-G formula based on SCr of 0.74 mg/dL).   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, 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   CHF (congestive heart failure) (HCC)    followed colon resection, wouldn't let me get up   Chronic anticoagulation    Degenerative disc disease, lumbar    Diverticulitis 2018   Diverticulosis    DVT of lower extremity, bilateral (HCC) 2018   Essential hypertension    History of being hospitalized    1 month ago for 3 days, vomiting, and kink in upper intestine   History of hiatal hernia    Mediastinal adenopathy 04/2023   MRSA nasal colonization    Non-small cell lung cancer (HCC) 2022   right   Obesity (BMI 30-39.9)    Osteoarthritis    Osteoporosis    PE (pulmonary thromboembolism) (HCC) 2018   Pneumonia    Small bowel obstruction (HCC) 03/20/2023   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HCC)     Type 2 diabetes mellitus with obesity (HCC)    Vertigo     Medications:  Currently on Warfarin 5 mg po daily (TWD 35mg ) Multiple recent weekly warfarin dose decreases since 10/2023 TWD 35mg  per 7/16 [facility] clinic visit note (current regimen) INR 2.8 TWD 41mg  per 6/24 note INR 3.8 TWD 45mg  per 6/10 note   Assessment: 73 yo presented to ED with complaint of increased shortness of breath.  Was recently hospitalized 7/16-7/18 with pneumonia. During workup patient was also found to have new acute DVT despite being on warfarin.  Pharmacy consulted to manage Warfarin. Patient has required multiple recent decreases in total weekly warfarin dose per recent anticoag clinic visit notes. Provider spoke with patient about possibility of transitioning to Eliquis  and patient wants a day to think it over due to cost issues.  Interacting medication: Azithromycin (completed 7/25), ropirinole(on PTA)    Goal of Therapy:  INR 2-3 Monitor platelets by anticoagulation protocol: Yes    Date INR Warfarin Dose  7/20 2.9 5 mg (PTA)  7/21 3.0 1.5 mg  7/22 2.6 2.5 mg  7/23 2.1 5 mg  7/24 1.9 6 mg  7/25 1.9 6 mg  7/26 2.2               Plan:  INR therapeutic. Will d/c enoxaparin  bridge Will order warfarin  5mg  PO x 1 today Discontinue enoxaparin  as INR therapeutic Daily INR while admitted CBC at least every 72 hours   Allean Haas PharmD Clinical Pharmacist 12/17/2023

## 2023-12-17 NOTE — Progress Notes (Signed)
 SATURATION QUALIFICATIONS: (This note is used to comply with regulatory documentation for home oxygen)  Patient Saturations on Room Air at Rest = 93%  Patient Saturations on Room Air while Ambulating = 86%  Patient Saturations on 2 Liters of oxygen while Ambulating = 92%  Please briefly explain why patient needs home oxygen: To maintain adequate oxygen saturation

## 2023-12-17 NOTE — Plan of Care (Signed)

## 2023-12-17 NOTE — Evaluation (Signed)
 Occupational Therapy Evaluation Patient Details Name: Kara [name] MRN: [id number] DOB: 05-Mar-1951 Today's Date: 12/17/2023   History of Present Illness   73 y.o. female with medical history significant of T2DM, HTN, history of PE, non-small cell lung cancer with recurrence who was recently admitted with sepsis related to pneumonia.Found to have R LE DVT on 7/20.     Clinical Impressions Patient presenting with decreased ind in self care,balance, functional mobility/transfers, endurance, and safety awareness. Patient reports being Mod I at baseline with use of rollator in home and [facility] in community. 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 Pt on 2Ls O2 via [place] and removed during session. She was 93% on RA but after ambulation in room with RW, O2 saturation decreased to 86% and pt placed back on oxygen.  Patient is motivated to return home and work. Patient will benefit from acute OT to increase overall independence in the areas of ADLs, functional mobility, and safety awareness in order to safely discharge.     If plan is discharge home, recommend the following:   A little help with walking and/or transfers;A little help with bathing/dressing/bathroom;Assistance with cooking/housework;Help with stairs or ramp for entrance     Functional Status Assessment   Patient has had a recent decline in their functional status and demonstrates the ability to make significant improvements in function in a reasonable and predictable amount of time.     Equipment Recommendations   None recommended by OT      Precautions/Restrictions   Precautions Precautions: Fall     Mobility Bed Mobility               General bed mobility comments: seated on EOB at beginning and end of session    Transfers Overall transfer level: Needs assistance Equipment used: Rolling walker (2 wheels) Transfers: Sit to/from  Stand Sit to Stand: Supervision                  Balance Overall balance assessment: Needs assistance Sitting-balance support: Feet unsupported, No upper extremity supported Sitting balance-Leahy Scale: Good     Standing balance support: During functional activity, No upper extremity supported, Single extremity supported Standing balance-Leahy Scale: Good                             ADL either performed or assessed with clinical judgement   ADL Overall ADL's : Needs assistance/impaired     Grooming: Wash/dry hands;Standing;Supervision/safety                               Functional mobility during ADLs: Supervision/safety;Rolling walker (2 wheels)       Vision Patient Visual Report: No change from baseline              Pertinent Vitals/Pain Pain Assessment Pain Assessment: Faces Faces Pain Scale: Hurts a little bit Pain Location: chronic back pain Pain Descriptors / Indicators: Discomfort Pain Intervention(s): Monitored during session     Extremity/Trunk Assessment Upper Extremity Assessment Upper Extremity Assessment: Overall WFL for tasks assessed   Lower Extremity Assessment Lower Extremity Assessment: Generalized weakness       Communication Communication Communication: No apparent difficulties   Cognition Arousal: Alert Behavior During Therapy: WFL for tasks assessed/performed Cognition: No apparent impairments  Following commands: Intact       Cueing  General Comments   Cueing Techniques: Verbal cues              Home Living Family/patient expects to be discharged to:: Private residence Living Arrangements: Spouse/significant other Available Help at Discharge: Family Type of Home: House Home Access: Stairs to enter Secretary/administrator of Steps: 3+1 Entrance Stairs-Rails: Right;Left;Can reach both Home Layout: One level     Bathroom Shower/Tub: Dealer Accessibility: No   Home Equipment: Rollator (4 wheels);Cane - single point;Shower seat   Additional Comments: Pt works 5x/wk for 2 hours and 1 hour on saturday. 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      Prior Functioning/Environment               Mobility Comments: Ambulatory with rollator in the the home, SPC outside of the home, sits to take breaks PRN, driving. ADLs Comments: Pt reports Ind with self care tasks at baseline with use of AD at needed.    OT Problem List: Decreased strength;Impaired balance (sitting and/or standing);Decreased safety awareness;Cardiopulmonary status limiting activity;Decreased activity tolerance   OT Treatment/Interventions: Self-care/ADL training;Therapeutic exercise;Patient/family education;Balance training;Energy conservation;Therapeutic activities      OT Goals(Current goals can be found in the care plan section)   Acute Rehab OT Goals Patient Stated Goal: to go home and back to work OT Goal Formulation: With patient Time For Goal Achievement: 12/30/23 Potential to Achieve Goals: Fair ADL Goals Pt Will Perform Grooming: with modified independence Pt Will Perform Lower Body Dressing: with modified independence Pt Will Transfer to Toilet: with modified independence Pt Will Perform Toileting - Clothing Manipulation and hygiene: with modified independence   OT Frequency:  Min 2X/week       AM-PAC OT 6 Clicks Daily Activity     Outcome Measure Help from another person eating meals?: None Help from another person taking care of personal grooming?: None Help from another person toileting, which includes using toliet, bedpan, or urinal?: A Little Help from another person bathing (including washing, rinsing, drying)?: A Little Help from another person to put on and taking off regular upper body clothing?: None Help from another person to put on and taking off regular lower body clothing?: A Little 6 Click  Score: 21   End of Session Equipment Utilized During Treatment: Rolling walker (2 wheels)  Activity Tolerance: Patient tolerated treatment well Patient left: in bed;with call bell/phone within reach  OT Visit Diagnosis: Unsteadiness on feet (R26.81);Muscle weakness (generalized) (M62.81)                Time: 8983-8970 OT Time Calculation (min): 13 min Charges:  OT General Charges $OT Visit: 1 Visit OT Evaluation $OT Eval Low Complexity: 1 Low Izetta Claude, MS, OTR/L , CBIS ascom [phone]  12/17/23, 10:40 AM

## 2023-12-17 NOTE — Progress Notes (Signed)
 PT Cancellation Note  Patient Details Name: Kara [name] MRN: [id number] DOB: February 27, 1951   Cancelled Treatment:    Reason Eval/Treat Not Completed: Fatigue/lethargy limiting ability to participate;Patient's level of consciousness (Pt asleep upon my arrival, does not stir whence stimulated. Will attempt evaluation again at later date/time.)  4:13 PM, 12/17/23 Peggye JAYSON Linear, PT, DPT Physical Therapist - [facility]  [phone] (ASCOM)    [place] 12/17/2023, 4:13 PM

## 2023-12-17 NOTE — Progress Notes (Signed)
 PROGRESS NOTE    Kara [name]  FMW:[id number] DOB: Jun 27, 1950 DOA: 12/11/2023 PCP: Sadie Manna, MD   Assessment & Plan:   Principal Problem:   Hypoxic respiratory failure (HCC) Active Problems:   Diabetic peripheral neuropathy (HCC)   Recurrent non-small cell lung cancer (NSCLC) (HCC)   Uncontrolled type 2 diabetes mellitus with hyperglycemia, without long-term current use of insulin  (HCC)   Chronic anticoagulation (COUMADIN )   Long term current use of opiate analgesic   Benign essential hypertension   Acute deep vein thrombosis (DVT) of distal end of right lower extremity (HCC)   Chronic diastolic (congestive) heart failure (HCC)   Chemotherapy induced neutropenia (HCC)  Assessment and Plan: Acute hypoxic respiratory failure:intermittent O2 desat to 88% while on 2L O2.  CTA neg PE. Completed abx course. Continue on supplemental oxygen and unable to wean off and will likely d/c home w/ oxygen   Likely pneumonia: completed abx course. Continue on IV steroids, bronchodilators & encourage incentive spirometry. Pulmon following and recs apprec.   Chronic pain: of right hip. Continue on home dose of gabapentin , norco   Diabetic peripheral neuropathy: continue on home dose of gabapentin    Recurrent non-small cell lung cancer: continue on home dose of tagrisso . Will need to f/u outpatient w/ onco    DM2: fair control, HbA1c 7.2. Continue on SSI w/ accuchecks    Hx of PE: continue on home dose of warfarin. Pt wants to think over taking eliquis  instead of warfarin. Pt is taking warfarin b/c it is cheaper     Acute right DVT: etiology unclear. Unclear if pt failed warfarin or if INR was not therapeutic. Continue on warfarin and now lovenox  as INR is subtherapeutic. Pt is taking warfarin b/c it is cheaper. Pt wants to think over taking eliquis  instead of warfarin    Chemotherapy induced pancytopenia: labile. No need for a transfusion currently    Chronic diastolic CHF:  appears compensated. Monitor I/Os  HTN: holding home dose of irbesartan  as BP is low normal    Obesity: BMI 33.1. Would benefit from weight loss       DVT prophylaxis: warfarin, lovenox  Code Status: full  Family Communication: Disposition Plan: likely d/c back home  Level of care: Med-Surg  Status is: Inpatient Remains inpatient appropriate because: severity of illness, requiring IV steroids     Consultants:  Pulmon, Dr. Fernand (requested by pt)  Procedures:   Antimicrobials:    Subjective: Pt c/o shortness of breath, unchanged from day prior.   Objective: Vitals:   12/17/23 0258 12/17/23 0428 12/17/23 0720 12/17/23 0802  BP:  (!) 102/44  (!) 106/41  Pulse:  (!) 108  (!) 105  Resp:  20  19  Temp:  97.8 F (36.6 C)  98.1 F (36.7 C)  TempSrc:  Oral  Oral  SpO2: 91% 92% 92% 93%  Height:        Intake/Output Summary (Last 24 hours) at 12/17/2023 9185 Last data filed at 12/16/2023 1511 Gross per 24 hour  Intake 360 ml  Output --  Net 360 ml   There were no vitals filed for this visit.  Examination:  General exam: appears comfortable Respiratory system: decreased breath sounds b/l. Intermittent wheezes Cardiovascular system: S1/S2+. No rubs or gallops  Gastrointestinal system: abd is soft, NT, ND & hypoactive bowel sounds Central nervous system: alert & oriented. Moves all extremities  Psychiatry: judgement and insight appears at baseline. Flat mood and affect    Data Reviewed: I have personally reviewed  following labs and imaging studies  CBC: Recent Labs  Lab 12/11/23 2106 12/12/23 0425 12/13/23 0327 12/15/23 0526 12/16/23 0341 12/17/23 0306  WBC 3.8* 4.0 5.0 3.8* 5.4 6.3  NEUTROABS 2.6  --   --   --   --   --   HGB 11.2* 10.6* 10.7* 10.5* 10.4* 10.3*  HCT 33.8* 32.9* 32.7* 32.9* 31.7* 32.8*  MCV 90.1 94.0 92.4 92.9 92.7 93.2  PLT 80* 84* 87* 104* 110* 129*   Basic Metabolic Panel: Recent Labs  Lab 12/12/23 0425 12/13/23 0327  12/15/23 0526 12/16/23 0341 12/17/23 0306  NA 138 139 136 137 133*  K 3.2* 3.9 4.2 4.6 4.2  CL 103 105 101 101 101  CO2 24 25 26 27 27   GLUCOSE 99 131* 170* 220* 227*  BUN 11 10 10 13 18   CREATININE 0.74 0.64 0.52 0.69 0.74  CALCIUM  8.4* 8.2* 8.2* 8.6* 8.6*  MG  --  2.1  --   --   --    GFR: Estimated Creatinine Clearance: 65.6 mL/min (by C-G formula based on SCr of 0.74 mg/dL). Liver Function Tests: Recent Labs  Lab 12/11/23 2106  AST 16  ALT 10  ALKPHOS 40  BILITOT 0.6  PROT 7.5  ALBUMIN 2.8*   Recent Labs  Lab 12/11/23 2106  LIPASE 23   No results for input(s): AMMONIA in the last 168 hours. Coagulation Profile: Recent Labs  Lab 12/13/23 0327 12/14/23 0503 12/15/23 0526 12/16/23 0341 12/17/23 0306  INR 2.6* 2.1* 1.9* 1.9* 2.2*   Cardiac Enzymes: No results for input(s): CKTOTAL, CKMB, CKMBINDEX, TROPONINI in the last 168 hours. BNP (last 3 results) No results for input(s): PROBNP in the last 8760 hours. HbA1C: No results for input(s): HGBA1C in the last 72 hours. CBG: Recent Labs  Lab 12/16/23 0752 12/16/23 1132 12/16/23 1624 12/16/23 1938 12/17/23 0802  GLUCAP 160* 168* 277* 234* 193*   Lipid Profile: No results for input(s): CHOL, HDL, LDLCALC, TRIG, CHOLHDL, LDLDIRECT in the last 72 hours. Thyroid  Function Tests: No results for input(s): TSH, T4TOTAL, FREET4, T3FREE, THYROIDAB in the last 72 hours. Anemia Panel: No results for input(s): VITAMINB12, FOLATE, FERRITIN, TIBC, IRON, RETICCTPCT in the last 72 hours. Sepsis Labs: Recent Labs  Lab 12/11/23 2106 12/11/23 2309 12/11/23 2310  PROCALCITON 5.22 5.18  --   LATICACIDVEN 1.0  --  0.7    Recent Results (from the past 240 hours)  Blood culture (routine x 2)     Status: None   Collection Time: 12/07/23 10:41 PM   Specimen: BLOOD  Result Value Ref Range Status   Specimen Description BLOOD BLOOD RIGHT ARM  Final   Special Requests    Final    BOTTLES DRAWN AEROBIC AND ANAEROBIC Blood Culture adequate volume   Culture   Final    NO GROWTH 5 DAYS Performed at [facility], [address] [place], [place] [address]    Report Status 12/12/2023 FINAL  Final  Blood culture (routine x 2)     Status: None   Collection Time: 12/07/23 10:41 PM   Specimen: BLOOD  Result Value Ref Range Status   Specimen Description BLOOD BLOOD LEFT FOREARM  Final   Special Requests   Final    BOTTLES DRAWN AEROBIC AND ANAEROBIC Blood Culture results may not be optimal due to an excessive volume of blood received in culture bottles   Culture   Final    NO GROWTH 5 DAYS Performed at [facility], [address], [place], [place] [address]    Report Status 12/12/2023 FINAL  Final  Urine Culture (for pregnant, neutropenic or urologic patients or patients with an indwelling urinary catheter)     Status: Abnormal   Collection Time: 12/08/23  1:32 AM   Specimen: Urine, Clean Catch  Result Value Ref Range Status   Specimen Description   Final    URINE, CLEAN CATCH Performed at [facility], [address], [place], [place] [address]    Special Requests   Final    NONE Performed at [facility], [address], [place], [place] [address]    Culture 50,000 COLONIES/mL KLEBSIELLA PNEUMONIAE (A)  Final   Report Status 12/10/2023 FINAL  Final   Organism ID, Bacteria KLEBSIELLA PNEUMONIAE (A)  Final      Susceptibility   Klebsiella pneumoniae - MIC*    AMPICILLIN RESISTANT Resistant     CEFAZOLIN  <=4 SENSITIVE Sensitive     CEFEPIME  <=0.12 SENSITIVE Sensitive     CEFTRIAXONE  <=0.25 SENSITIVE Sensitive     CIPROFLOXACIN  <=0.25 SENSITIVE Sensitive     GENTAMICIN <=1 SENSITIVE Sensitive     IMIPENEM <=0.25 SENSITIVE Sensitive     NITROFURANTOIN <=16 SENSITIVE Sensitive     TRIMETH/SULFA <=20 SENSITIVE Sensitive     AMPICILLIN/SULBACTAM <=2 SENSITIVE Sensitive     PIP/TAZO <=4 SENSITIVE Sensitive ug/mL     * 50,000 COLONIES/mL KLEBSIELLA PNEUMONIAE  SARS Coronavirus 2 by RT PCR (hospital order, performed in [facility] hospital lab) *cepheid single result test* Anterior Nasal Swab     Status: None   Collection Time: 12/08/23 10:26 AM   Specimen: Anterior Nasal Swab  Result Value Ref Range Status   SARS Coronavirus 2 by RT PCR NEGATIVE NEGATIVE Final    Comment: (NOTE) SARS-CoV-2 target nucleic acids are NOT DETECTED.  The SARS-CoV-2 RNA is generally detectable in upper and lower respiratory specimens during the acute phase of infection. The lowest concentration of SARS-CoV-2 viral copies this assay can detect is 250 copies / mL. A negative result does not preclude SARS-CoV-2 infection and should not be used as the sole basis for treatment or other patient management decisions.  A negative result may occur with improper specimen collection / handling, submission of specimen other than nasopharyngeal swab, presence of viral mutation(s) within the areas targeted by this assay, and inadequate number of viral copies (<250 copies / mL). A negative result must be combined with clinical observations, patient history, and epidemiological information.  Fact Sheet for Patients:   RoadLapTop.co.za  Fact Sheet for Healthcare Providers: http://kim-miller.com/  This test is not yet approved or  cleared by the [place]  FDA and has been authorized for detection and/or diagnosis of SARS-CoV-2 by FDA under an Emergency Use Authorization (EUA).  This EUA will remain in effect (meaning this test can be used) for the duration of the COVID-19 declaration under Section 564(b)(1) of the Act, 21 U.S.C. section 360bbb-3(b)(1), unless the authorization is terminated or revoked sooner.  Performed at [facility], [address] [place], [place] [address]   Respiratory (~20 pathogens) panel by PCR     Status: None   Collection Time: 12/08/23 11:23  AM   Specimen: Nasopharyngeal Swab; Respiratory  Result Value Ref Range Status   Adenovirus NOT DETECTED NOT DETECTED Final   Coronavirus 229E NOT DETECTED NOT DETECTED Final    Comment: (NOTE) The Coronavirus on the Respiratory Panel, DOES NOT test for the novel  Coronavirus (2019 nCoV)    Coronavirus  HKU1 NOT DETECTED NOT DETECTED Final   Coronavirus NL63 NOT DETECTED NOT DETECTED Final   Coronavirus OC43 NOT DETECTED NOT DETECTED Final   Metapneumovirus NOT DETECTED NOT DETECTED Final   Rhinovirus / Enterovirus NOT DETECTED NOT DETECTED Final   Influenza A NOT DETECTED NOT DETECTED Final   Influenza B NOT DETECTED NOT DETECTED Final   Parainfluenza Virus 1 NOT DETECTED NOT DETECTED Final   Parainfluenza Virus 2 NOT DETECTED NOT DETECTED Final   Parainfluenza Virus 3 NOT DETECTED NOT DETECTED Final   Parainfluenza Virus 4 NOT DETECTED NOT DETECTED Final   Respiratory Syncytial Virus NOT DETECTED NOT DETECTED Final   Bordetella pertussis NOT DETECTED NOT DETECTED Final   Bordetella Parapertussis NOT DETECTED NOT DETECTED Final   Chlamydophila pneumoniae NOT DETECTED NOT DETECTED Final   Mycoplasma pneumoniae NOT DETECTED NOT DETECTED Final    Comment: Performed at [facility], [address] [place], [place] [address]  Blood culture (routine x 2)     Status: None   Collection Time: 12/11/23  9:06 PM   Specimen: BLOOD  Result Value Ref Range Status   Specimen Description BLOOD RIGHT [facility]  Final   Special Requests   Final    BOTTLES DRAWN AEROBIC AND ANAEROBIC Blood Culture adequate volume   Culture   Final    NO GROWTH 5 DAYS Performed at [facility], [address] [place], [place] [address]    Report Status 12/16/2023 FINAL  Final  Blood culture (routine x 2)     Status: None   Collection Time: 12/12/23  2:37 AM   Specimen: BLOOD  Result Value Ref Range Status   Specimen Description BLOOD BLOOD RIGHT ARM  Final   Special Requests   Final    BOTTLES  DRAWN AEROBIC AND ANAEROBIC Blood Culture results may not be optimal due to an inadequate volume of blood received in culture bottles   Culture   Final    NO GROWTH 5 DAYS Performed at [facility], [address] [place], [place] [address]    Report Status 12/17/2023 FINAL  Final  Urine Culture     Status: None   Collection Time: 12/12/23  9:00 AM   Specimen: Urine, Clean Catch  Result Value Ref Range Status   Specimen Description   Final    URINE, CLEAN CATCH Performed at [facility], [address], [place], [place] [address]    Special Requests   Final    NONE Reflexed from [id number] Performed at [facility] [address] [place], [place] [address]    Culture   Final    NO GROWTH Performed at [facility], [address], [place], [place] [address]    Report Status 12/13/2023 FINAL  Final         Radiology Studies: No results found.      Scheduled Meds:  albuterol   2.5 mg Nebulization Q6H   budesonide -glycopyrrolate -formoterol   2 puff Inhalation BID   cholecalciferol   1,000 Units Oral Daily   cyanocobalamin   5,000 mcg Oral Daily   cyclobenzaprine   10 mg Oral QHS   enoxaparin  (LOVENOX ) injection  1 mg/kg Subcutaneous Q12H   feeding supplement  237 mL Oral BID BM   gabapentin   200 mg Oral TID with meals   gabapentin   300 mg Oral QHS   insulin  aspart  0-15 Units Subcutaneous TID WC   insulin  aspart  0-5 Units Subcutaneous QHS   magic mouthwash w/lidocaine   15 mL Oral TID   magnesium  oxide  400 mg Oral QHS   methylPREDNISolone  (SOLU-MEDROL ) injection  40 mg Intravenous Q24H   osimertinib  mesylate  80 mg Oral Daily   oxybutynin   5 mg Oral BID   pantoprazole   40 mg Oral Daily   potassium chloride  SA  20 mEq Oral Daily   rOPINIRole   1 mg Oral QHS   sodium chloride  flush  3 mL Intravenous Q12H   sucralfate   1 g Oral TID WC   Warfarin - Pharmacist Dosing Inpatient   Does not apply q1600   Continuous Infusions:     LOS: 6  days      Anthony CHRISTELLA Pouch, MD [facility] Pager [phone]  If 7PM-7AM, please contact night-coverage [email or web address] 12/17/2023, 8:14 AM

## 2023-12-18 ENCOUNTER — Inpatient Hospital Stay

## 2023-12-18 DIAGNOSIS — J9601 Acute respiratory failure with hypoxia: Secondary | ICD-10-CM | POA: Diagnosis not present

## 2023-12-18 LAB — PROTIME-INR
INR: 2.4 — ABNORMAL HIGH (ref 0.8–1.2)
Prothrombin Time: 27.2 s — ABNORMAL HIGH (ref 11.4–15.2)

## 2023-12-18 LAB — BASIC METABOLIC PANEL WITH GFR
Anion gap: 8 (ref 5–15)
BUN: 20 mg/dL (ref 8–23)
CO2: 27 mmol/L (ref 22–32)
Calcium: 8.7 mg/dL — ABNORMAL LOW (ref 8.9–10.3)
Chloride: 102 mmol/L (ref 98–111)
Creatinine, Ser: 0.74 mg/dL (ref 0.44–1.00)
GFR, Estimated: >60 mL/min (ref >60)
Glucose, Bld: 203 mg/dL — ABNORMAL HIGH (ref 70–99)
Potassium: 4 mmol/L (ref 3.5–5.1)
Sodium: 137 mmol/L (ref 135–145)

## 2023-12-18 LAB — CBC
HCT: 33.8 % — ABNORMAL LOW (ref 36.0–46.0)
Hemoglobin: 10.7 g/dL — ABNORMAL LOW (ref 12.0–15.0)
MCH: 29.4 pg (ref 26.0–34.0)
MCHC: 31.7 g/dL (ref 30.0–36.0)
MCV: 92.9 fL (ref 80.0–100.0)
Platelets: 132 K/uL — ABNORMAL LOW (ref 150–400)
RBC: 3.64 MIL/uL — ABNORMAL LOW (ref 3.87–5.11)
RDW: 13.3 % (ref 11.5–15.5)
WBC: 6.5 K/uL (ref 4.0–10.5)
nRBC: 0 % (ref 0.0–0.2)

## 2023-12-18 LAB — GLUCOSE, CAPILLARY
Glucose-Capillary: 205 mg/dL — ABNORMAL HIGH (ref 70–99)
Glucose-Capillary: 236 mg/dL — ABNORMAL HIGH (ref 70–99)
Glucose-Capillary: 252 mg/dL — ABNORMAL HIGH (ref 70–99)
Glucose-Capillary: 261 mg/dL — ABNORMAL HIGH (ref 70–99)

## 2023-12-18 MED ORDER — WARFARIN SODIUM 5 MG PO TABS
5.0000 mg | ORAL_TABLET | Freq: Once | ORAL | Status: AC
  Administered 2023-12-18: 5 mg via ORAL
  Filled 2023-12-18: qty 1

## 2023-12-18 MED ORDER — PROCHLORPERAZINE EDISYLATE 10 MG/2ML IJ SOLN
10.0000 mg | Freq: Four times a day (QID) | INTRAMUSCULAR | Status: DC | PRN
  Administered 2023-12-18: 10 mg via INTRAVENOUS
  Filled 2023-12-18 (×3): qty 2

## 2023-12-18 MED ORDER — ONDANSETRON HCL 4 MG/2ML IJ SOLN
4.0000 mg | Freq: Four times a day (QID) | INTRAMUSCULAR | Status: DC | PRN
  Administered 2023-12-18 (×2): 4 mg via INTRAVENOUS
  Filled 2023-12-18 (×2): qty 2

## 2023-12-18 MED ORDER — SCOPOLAMINE 1 MG/3DAYS TD PT72
1.0000 | MEDICATED_PATCH | TRANSDERMAL | Status: DC
  Administered 2023-12-18: 1.5 mg via TRANSDERMAL
  Filled 2023-12-18: qty 1

## 2023-12-18 NOTE — Progress Notes (Signed)
 PT Cancellation Note  Patient Details Name: Koby Hartfield MRN: [id number] DOB: 07/02/1950   Cancelled Treatment:    Reason Eval/Treat Not Completed: Other (comment);Patient declined, no reason specified (pt reports feeling nauseated and unable to get out of bed.)  Pt reports RN gave her medication but its not helping at this time. Requested PT to come back later in the PM when she is feeling better. Will re-attempt when patient is agreeable and medically appropriate.   Lerry Cordrey PT, DPT 12/18/2023, 10:23 AM

## 2023-12-18 NOTE — Progress Notes (Signed)
 PT Cancellation Note  Patient Details Name: Kara [name] MRN: [id number] DOB: 1951/04/28   Cancelled Treatment:    Reason Eval/Treat Not Completed: Other (comment);Patient declined, no reason specified; PT attempted PT eval this afternoon, but patient still nauseated. She was sitting in chair edge of bed without supplemental O2 (room air), Spo2 levels 95% on room air.   PT went back for 2nd attempt this afternoon, but patient off floor for procedure.  Will re-attempt when patient is available and agreeable to therapy;    Kaidence Callaway PT, DPT 12/18/2023, 3:49 PM

## 2023-12-18 NOTE — Consult Note (Signed)
 PHARMACY - ANTICOAGULATION CONSULT NOTE  Pharmacy Consult for warfarin management Indication: DVT  Allergies  Allergen Reactions   Adhesive [Tape] Other (See Comments)    Pt reports, when removing tape from skin most tapes pull her skin off. Ok to use paper tape   Avapro  [Irbesartan ] Anaphylaxis    Facial swelling   Morphine  And Codeine  Nausea And Vomiting   Other Hives    Chlorhexadine wipes/CHG wipes,    Latex Rash    Exam gloves/ does not react around elastic and lips don't swell when blowing balloons    Patient Measurements: Height: 5' 3 (160 cm) IBW/kg (Calculated) : 52.4  Vital Signs: Temp: 97.9 F (36.6 C) (07/27 0721) BP: 133/60 (07/27 0721) Pulse Rate: 100 (07/27 0721)  Labs: Recent Labs    12/16/23 0341 12/17/23 0306 12/18/23 0401  HGB 10.4* 10.3* 10.7*  HCT 31.7* 32.8* 33.8*  PLT 110* 129* 132*  LABPROT 23.2* 25.5* 27.2*  INR 1.9* 2.2* 2.4*  CREATININE 0.69 0.74 0.74    Estimated Creatinine Clearance: 65.6 mL/min (by C-G formula based on SCr of 0.74 mg/dL).   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, 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   CHF (congestive heart failure) (HCC)    followed colon resection, wouldn't let me get up   Chronic anticoagulation    Degenerative disc disease, lumbar    Diverticulitis 2018   Diverticulosis    DVT of lower extremity, bilateral (HCC) 2018   Essential hypertension    History of being hospitalized    1 month ago for 3 days, vomiting, and kink in upper intestine   History of hiatal hernia    Mediastinal adenopathy 04/2023   MRSA nasal colonization    Non-small cell lung cancer (HCC) 2022   right   Obesity (BMI 30-39.9)    Osteoarthritis    Osteoporosis    PE (pulmonary thromboembolism) (HCC) 2018   Pneumonia    Small bowel obstruction (HCC) 03/20/2023   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HCC)    Type 2 diabetes mellitus with  obesity (HCC)    Vertigo     Medications:  Currently on Warfarin 5 mg po daily (TWD 35mg ) Multiple recent weekly warfarin dose decreases since 10/2023 TWD 35mg  per 7/16 [facility] clinic visit note (current regimen) INR 2.8 TWD 41mg  per 6/24 note INR 3.8 TWD 45mg  per 6/10 note   Assessment: 73 yo presented to ED with complaint of increased shortness of breath.  Was recently hospitalized 7/16-7/18 with pneumonia. During workup patient was also found to have new acute DVT despite being on warfarin.  Pharmacy consulted to manage Warfarin. Patient has required multiple recent decreases in total weekly warfarin dose per recent anticoag clinic visit notes. Provider spoke with patient about possibility of transitioning to Eliquis  and patient wants a day to think it over due to cost issues.  Interacting medication: Azithromycin (completed 7/25), ropirinole(on PTA)    Goal of Therapy:  INR 2-3 Monitor platelets by anticoagulation protocol: Yes    Date INR Warfarin Dose  7/20 2.9 5 mg (PTA)  7/21 3.0 1.5 mg  7/22 2.6 2.5 mg  7/23 2.1 5 mg  7/24 1.9 6 mg  7/25 1.9 6 mg  7/26 2.2  5 mg  7/27 2.4          Plan:  INR therapeutic.  Will order warfarin 5mg  PO x 1 today Daily INR  while admitted CBC at least every 72 hours   Allean Haas PharmD Clinical Pharmacist 12/18/2023

## 2023-12-18 NOTE — Progress Notes (Signed)
 PROGRESS NOTE    Kara [name]  FMW:[id number] DOB: Mar 02, 1951 DOA: 12/11/2023 PCP: Sadie Manna, MD   Assessment & Plan:   Principal Problem:   Hypoxic respiratory failure (HCC) Active Problems:   Diabetic peripheral neuropathy (HCC)   Recurrent non-small cell lung cancer (NSCLC) (HCC)   Uncontrolled type 2 diabetes mellitus with hyperglycemia, without long-term current use of insulin  (HCC)   Chronic anticoagulation (COUMADIN )   Long term current use of opiate analgesic   Benign essential hypertension   Acute deep vein thrombosis (DVT) of distal end of right lower extremity (HCC)   Chronic diastolic (congestive) heart failure (HCC)   Chemotherapy induced neutropenia (HCC)  Assessment and Plan: Acute hypoxic respiratory failure:intermittent O2 desat to 88% while on 2L O2.  CTA neg PE. Completed abx course. Unable to wean off of supplemental oxygen and will d/c home w/ oxygen   Likely pneumonia: completed abx course. Continue on IV steroids, bronchodilators & encourage incentive spirometry. Pulmon following and recs apprec   Chronic pain: of right hip. Continue on home dose of norco, gabapentin    Diabetic peripheral neuropathy: continue on home dose of gabapentin    Recurrent non-small cell lung cancer: continue on home dose of tagrisso . Will need f/u outpatient w/ onco    DM2: fair control, HbA1c 7.2. Continue on SSI w/ accuchecks    Hx of PE: continue on home dose of warfarin. Pt wants to think over taking eliquis  instead of warfarin. Pt is taking warfarin b/c it is cheaper     Acute right DVT: etiology unclear. Unclear if pt failed warfarin or if INR was not therapeutic. Continue on warfarin and now lovenox  as INR is subtherapeutic. Pt is taking warfarin b/c it is cheaper. Pt wants to think over taking eliquis  instead of warfarin    Chemotherapy induced pancytopenia: labile. No need for a transfusion currently    Chronic diastolic CHF: appears compensated.  Monitor I/Os  HTN: holding home dose of irbesartan  as BP is low normal    Obesity: BMI 33.1. Would benefit from weight loss       DVT prophylaxis: warfarin Code Status: full  Family Communication: Disposition Plan: likely d/c back home  Level of care: Med-Surg  Status is: Inpatient Remains inpatient appropriate because: severity of illness, requiring IV steroids     Consultants:  Pulmon, Dr. Fernand (requested by pt)  Procedures:   Antimicrobials:    Subjective: Pt c/o nausea & vomiting.   Objective: Vitals:   12/18/23 0426 12/18/23 0428 12/18/23 0710 12/18/23 0721  BP: 136/68 136/68  133/60  Pulse: (!) 110   100  Resp: 18 18  16   Temp: 97.9 F (36.6 C) 97.9 F (36.6 C)  97.9 F (36.6 C)  TempSrc:      SpO2: 97% 95% 94% 93%  Height:       No intake or output data in the 24 hours ending 12/18/23 0824  There were no vitals filed for this visit.  Examination:  General exam: appears uncomfortable Respiratory system: diminished breath sounds b/l  Cardiovascular system: S1 & S2+. No rubs or clicks   Gastrointestinal system: abd is soft, NT, ND & normal bowel sounds Central nervous system: alert & oriented. Moves all extremities  Psychiatry: judgement and insight appears at baseline. Flat mood and affec    Data Reviewed: I have personally reviewed following labs and imaging studies  CBC: Recent Labs  Lab 12/11/23 2106 12/12/23 0425 12/13/23 0327 12/15/23 9473 12/16/23 9658 12/17/23 0306 12/18/23 0401  WBC 3.8*   < > 5.0 3.8* 5.4 6.3 6.5  NEUTROABS 2.6  --   --   --   --   --   --   HGB 11.2*   < > 10.7* 10.5* 10.4* 10.3* 10.7*  HCT 33.8*   < > 32.7* 32.9* 31.7* 32.8* 33.8*  MCV 90.1   < > 92.4 92.9 92.7 93.2 92.9  PLT 80*   < > 87* 104* 110* 129* 132*   < > = values in this interval not displayed.   Basic Metabolic Panel: Recent Labs  Lab 12/13/23 0327 12/15/23 0526 12/16/23 0341 12/17/23 0306 12/18/23 0401  NA 139 136 137 133* 137  K  3.9 4.2 4.6 4.2 4.0  CL 105 101 101 101 102  CO2 25 26 27 27 27   GLUCOSE 131* 170* 220* 227* 203*  BUN 10 10 13 18 20   CREATININE 0.64 0.52 0.69 0.74 0.74  CALCIUM  8.2* 8.2* 8.6* 8.6* 8.7*  MG 2.1  --   --   --   --    GFR: Estimated Creatinine Clearance: 65.6 mL/min (by C-G formula based on SCr of 0.74 mg/dL). Liver Function Tests: Recent Labs  Lab 12/11/23 2106  AST 16  ALT 10  ALKPHOS 40  BILITOT 0.6  PROT 7.5  ALBUMIN 2.8*   Recent Labs  Lab 12/11/23 2106  LIPASE 23   No results for input(s): AMMONIA in the last 168 hours. Coagulation Profile: Recent Labs  Lab 12/14/23 0503 12/15/23 0526 12/16/23 0341 12/17/23 0306 12/18/23 0401  INR 2.1* 1.9* 1.9* 2.2* 2.4*   Cardiac Enzymes: No results for input(s): CKTOTAL, CKMB, CKMBINDEX, TROPONINI in the last 168 hours. BNP (last 3 results) No results for input(s): PROBNP in the last 8760 hours. HbA1C: No results for input(s): HGBA1C in the last 72 hours. CBG: Recent Labs  Lab 12/16/23 1938 12/17/23 0802 12/17/23 1136 12/17/23 1643 12/17/23 1929  GLUCAP 234* 193* 207* 304* 354*   Lipid Profile: No results for input(s): CHOL, HDL, LDLCALC, TRIG, CHOLHDL, LDLDIRECT in the last 72 hours. Thyroid  Function Tests: No results for input(s): TSH, T4TOTAL, FREET4, T3FREE, THYROIDAB in the last 72 hours. Anemia Panel: No results for input(s): VITAMINB12, FOLATE, FERRITIN, TIBC, IRON, RETICCTPCT in the last 72 hours. Sepsis Labs: Recent Labs  Lab 12/11/23 2106 12/11/23 2309 12/11/23 2310  PROCALCITON 5.22 5.18  --   LATICACIDVEN 1.0  --  0.7    Recent Results (from the past 240 hours)  SARS Coronavirus 2 by RT PCR (hospital order, performed in [facility] hospital lab) *cepheid single result test* Anterior Nasal Swab     Status: None   Collection Time: 12/08/23 10:26 AM   Specimen: Anterior Nasal Swab  Result Value Ref Range Status   SARS Coronavirus 2 by RT PCR  NEGATIVE NEGATIVE Final    Comment: (NOTE) SARS-CoV-2 target nucleic acids are NOT DETECTED.  The SARS-CoV-2 RNA is generally detectable in upper and lower respiratory specimens during the acute phase of infection. The lowest concentration of SARS-CoV-2 viral copies this assay can detect is 250 copies / mL. A negative result does not preclude SARS-CoV-2 infection and should not be used as the sole basis for treatment or other patient management decisions.  A negative result may occur with improper specimen collection / handling, submission of specimen other than nasopharyngeal swab, presence of viral mutation(s) within the areas targeted by this assay, and inadequate number of viral copies (<250 copies / mL). A negative result must be  combined with clinical observations, patient history, and epidemiological information.  Fact Sheet for Patients:   RoadLapTop.co.za  Fact Sheet for Healthcare Providers: http://kim-miller.com/  This test is not yet approved or  cleared by the [place]  FDA and has been authorized for detection and/or diagnosis of SARS-CoV-2 by FDA under an Emergency Use Authorization (EUA).  This EUA will remain in effect (meaning this test can be used) for the duration of the COVID-19 declaration under Section 564(b)(1) of the Act, 21 U.S.C. section 360bbb-3(b)(1), unless the authorization is terminated or revoked sooner.  Performed at [facility], [address] [place], [place] [address]   Respiratory (~20 pathogens) panel by PCR     Status: None   Collection Time: 12/08/23 11:23 AM   Specimen: Nasopharyngeal Swab; Respiratory  Result Value Ref Range Status   Adenovirus NOT DETECTED NOT DETECTED Final   Coronavirus 229E NOT DETECTED NOT DETECTED Final    Comment: (NOTE) The Coronavirus on the Respiratory Panel, DOES NOT test for the novel  Coronavirus (2019 nCoV)    Coronavirus HKU1 NOT DETECTED  NOT DETECTED Final   Coronavirus NL63 NOT DETECTED NOT DETECTED Final   Coronavirus OC43 NOT DETECTED NOT DETECTED Final   Metapneumovirus NOT DETECTED NOT DETECTED Final   Rhinovirus / Enterovirus NOT DETECTED NOT DETECTED Final   Influenza A NOT DETECTED NOT DETECTED Final   Influenza B NOT DETECTED NOT DETECTED Final   Parainfluenza Virus 1 NOT DETECTED NOT DETECTED Final   Parainfluenza Virus 2 NOT DETECTED NOT DETECTED Final   Parainfluenza Virus 3 NOT DETECTED NOT DETECTED Final   Parainfluenza Virus 4 NOT DETECTED NOT DETECTED Final   Respiratory Syncytial Virus NOT DETECTED NOT DETECTED Final   Bordetella pertussis NOT DETECTED NOT DETECTED Final   Bordetella Parapertussis NOT DETECTED NOT DETECTED Final   Chlamydophila pneumoniae NOT DETECTED NOT DETECTED Final   Mycoplasma pneumoniae NOT DETECTED NOT DETECTED Final    Comment: Performed at [facility], [address], [place], [place] [address]  Blood culture (routine x 2)     Status: None   Collection Time: 12/11/23  9:06 PM   Specimen: BLOOD  Result Value Ref Range Status   Specimen Description BLOOD RIGHT [facility]  Final   Special Requests   Final    BOTTLES DRAWN AEROBIC AND ANAEROBIC Blood Culture adequate volume   Culture   Final    NO GROWTH 5 DAYS Performed at [facility], [address] [place], [place] [address]    Report Status 12/16/2023 FINAL  Final  Blood culture (routine x 2)     Status: None   Collection Time: 12/12/23  2:37 AM   Specimen: BLOOD  Result Value Ref Range Status   Specimen Description BLOOD BLOOD RIGHT ARM  Final   Special Requests   Final    BOTTLES DRAWN AEROBIC AND ANAEROBIC Blood Culture results may not be optimal due to an inadequate volume of blood received in culture bottles   Culture   Final    NO GROWTH 5 DAYS Performed at [facility], [address] [place], [place] [address]    Report Status 12/17/2023 FINAL  Final  Urine Culture     Status: None    Collection Time: 12/12/23  9:00 AM   Specimen: Urine, Clean Catch  Result Value Ref Range Status   Specimen Description   Final    URINE, CLEAN CATCH Performed at [facility], [address] [place],  [place] [address]    Special Requests   Final    NONE Reflexed from [id number] Performed at [facility], [address], [place], [place] [address]    Culture   Final    NO GROWTH Performed at [facility], [address], [place], [place] [address]    Report Status 12/13/2023 FINAL  Final         Radiology Studies: No results found.      Scheduled Meds:  albuterol   2.5 mg Nebulization Q6H   budesonide -glycopyrrolate -formoterol   2 puff Inhalation BID   cholecalciferol   1,000 Units Oral Daily   cyanocobalamin   5,000 mcg Oral Daily   cyclobenzaprine   10 mg Oral QHS   feeding supplement  237 mL Oral BID BM   gabapentin   200 mg Oral TID with meals   gabapentin   300 mg Oral QHS   insulin  aspart  0-15 Units Subcutaneous TID WC   insulin  aspart  0-5 Units Subcutaneous QHS   magic mouthwash w/lidocaine   15 mL Oral TID   magnesium  oxide  400 mg Oral QHS   methylPREDNISolone  (SOLU-MEDROL ) injection  40 mg Intravenous Q24H   osimertinib  mesylate  80 mg Oral Daily   oxybutynin   5 mg Oral BID   pantoprazole   40 mg Oral Daily   potassium chloride  SA  20 mEq Oral Daily   rOPINIRole   1 mg Oral QHS   sodium chloride  flush  3 mL Intravenous Q12H   sucralfate   1 g Oral TID WC   Warfarin - Pharmacist Dosing Inpatient   Does not apply q1600   Continuous Infusions:     LOS: 7 days      Anthony CHRISTELLA Pouch, MD [facility] Pager [phone]  If 7PM-7AM, please contact night-coverage [email or web address] 12/18/2023, 8:24 AM

## 2023-12-19 LAB — BASIC METABOLIC PANEL WITH GFR
Anion gap: 8 (ref 5–15)
BUN: 19 mg/dL (ref 8–23)
CO2: 26 mmol/L (ref 22–32)
Calcium: 8.5 mg/dL — ABNORMAL LOW (ref 8.9–10.3)
Chloride: 103 mmol/L (ref 98–111)
Creatinine, Ser: 0.84 mg/dL (ref 0.44–1.00)
GFR, Estimated: >60 mL/min (ref >60)
Glucose, Bld: 277 mg/dL — ABNORMAL HIGH (ref 70–99)
Potassium: 4.6 mmol/L (ref 3.5–5.1)
Sodium: 137 mmol/L (ref 135–145)

## 2023-12-19 LAB — GLUCOSE, CAPILLARY
Glucose-Capillary: 134 mg/dL — ABNORMAL HIGH (ref 70–99)
Glucose-Capillary: 380 mg/dL — ABNORMAL HIGH (ref 70–99)
Glucose-Capillary: 331 mg/dL — ABNORMAL HIGH (ref 70–99)

## 2023-12-19 LAB — CBC
HCT: 34.5 % — ABNORMAL LOW (ref 36.0–46.0)
Hemoglobin: 11.1 g/dL — ABNORMAL LOW (ref 12.0–15.0)
MCH: 29.8 pg (ref 26.0–34.0)
MCHC: 32.2 g/dL (ref 30.0–36.0)
MCV: 92.5 fL (ref 80.0–100.0)
Platelets: 133 K/uL — ABNORMAL LOW (ref 150–400)
RBC: 3.73 MIL/uL — ABNORMAL LOW (ref 3.87–5.11)
RDW: 13.3 % (ref 11.5–15.5)
WBC: 7 K/uL (ref 4.0–10.5)
nRBC: 0 % (ref 0.0–0.2)

## 2023-12-19 LAB — PROTIME-INR
INR: 2.4 — ABNORMAL HIGH (ref 0.8–1.2)
Prothrombin Time: 27.6 s — ABNORMAL HIGH (ref 11.4–15.2)

## 2023-12-19 MED ORDER — ALBUTEROL SULFATE (2.5 MG/3ML) 0.083% IN NEBU
2.5000 mg | INHALATION_SOLUTION | Freq: Three times a day (TID) | RESPIRATORY_TRACT | Status: DC
  Administered 2023-12-19 – 2023-12-20 (×4): 2.5 mg via RESPIRATORY_TRACT
  Filled 2023-12-19 (×4): qty 3

## 2023-12-19 MED ORDER — WARFARIN SODIUM 5 MG PO TABS
5.0000 mg | ORAL_TABLET | Freq: Once | ORAL | Status: AC
  Administered 2023-12-19: 5 mg via ORAL
  Filled 2023-12-19: qty 1

## 2023-12-19 MED ORDER — POLYETHYLENE GLYCOL 3350 17 G PO PACK
17.0000 g | PACK | Freq: Two times a day (BID) | ORAL | Status: DC
  Administered 2023-12-19 (×2): 17 g via ORAL
  Filled 2023-12-19 (×3): qty 1

## 2023-12-19 NOTE — Progress Notes (Signed)
 Occupational Therapy Treatment Patient Details Name: Kara [name] MRN: [id number] DOB: 01-27-1951 Today's Date: 12/19/2023   History of present illness 73 y.o. female with medical history significant of T2DM, HTN, history of PE, non-small cell lung cancer with recurrence who was recently admitted with sepsis related to pneumonia.Found to have R LE DVT on 7/20.   OT comments  Upon entering the room, pt supine in bed and agreeable to OT intervention. Pt declines all self care tasks because her goal is to ambulate and be able to get off of oxygen before returning home. Pt performs bed mobility without assistance. Pt stands with CGA and ambulates with supervision 100' with RW while on 2Ls via [place]. O2 saturation is 95% while on 2Ls ambulating. Pt then placed on RA and ambulating back to room in same manner as above. O2 saturation is 92% and returns to 94% before therapist exits room. OT notified RN and NT that pt now on RA. Call bell and all needed items within reach.        If plan is discharge home, recommend the following:  A little help with walking and/or transfers;A little help with bathing/dressing/bathroom;Assistance with cooking/housework;Help with stairs or ramp for entrance   Equipment Recommendations  None recommended by OT       Precautions / Restrictions Precautions Precautions: Fall       Mobility Bed Mobility Overal bed mobility: Modified Independent Bed Mobility: Supine to Sit, Sit to Supine                Transfers Overall transfer level: Needs assistance Equipment used: Rolling walker (2 wheels) Transfers: Sit to/from Stand Sit to Stand: Supervision                 Balance Overall balance assessment: Needs assistance Sitting-balance support: Feet unsupported, No upper extremity supported Sitting balance-Leahy Scale: Good Sitting balance - Comments: sitting edge of ED stretcher with BLE not touching floor, no LOB   Standing balance support:  During functional activity, No upper extremity supported, Single extremity supported Standing balance-Leahy Scale: Fair                             ADL either performed or assessed with clinical judgement     Vision Patient Visual Report: No change from baseline           Communication Communication Communication: No apparent difficulties   Cognition Arousal: Alert Behavior During Therapy: WFL for tasks assessed/performed Cognition: No apparent impairments                               Following commands: Intact        Cueing   Cueing Techniques: Verbal cues  Exercises              Pertinent Vitals/ Pain       Pain Assessment Pain Assessment: No/denies pain         Frequency  Min 2X/week        Progress Toward Goals  OT Goals(current goals can now be found in the care plan section)  Progress towards OT goals: Progressing toward goals      AM-PAC OT 6 Clicks Daily Activity     Outcome Measure   Help from another person eating meals?: None Help from another person taking care of personal grooming?: None Help from another person toileting, which includes using  toliet, bedpan, or urinal?: A Little Help from another person bathing (including washing, rinsing, drying)?: A Little Help from another person to put on and taking off regular upper body clothing?: None Help from another person to put on and taking off regular lower body clothing?: A Little 6 Click Score: 21    End of Session Equipment Utilized During Treatment: Rolling walker (2 wheels)  OT Visit Diagnosis: Unsteadiness on feet (R26.81);Muscle weakness (generalized) (M62.81)   Activity Tolerance Patient tolerated treatment well   Patient Left in bed;with call bell/phone within reach;with bed alarm set   Nurse Communication Other (comment) (Pt on RA)        Time: 8391-8374 OT Time Calculation (min): 17 min  Charges: OT General Charges $OT Visit: 1  Visit OT Treatments $Therapeutic Activity: 8-22 mins  Izetta Claude, MS, OTR/L , CBIS ascom [phone]  12/19/23, 4:42 PM

## 2023-12-19 NOTE — Plan of Care (Signed)
  Problem: Coping: Goal: Ability to adjust to condition or change in health will improve Outcome: Progressing   Problem: Nutritional: Goal: Maintenance of adequate nutrition will improve Outcome: Progressing   Problem: Education: Goal: Knowledge of General Education information will improve Description: Including pain rating scale, medication(s)/side effects and non-pharmacologic comfort measures Outcome: Progressing   Problem: Clinical Measurements: Goal: Diagnostic test results will improve Outcome: Progressing

## 2023-12-19 NOTE — Consult Note (Signed)
 PHARMACY - ANTICOAGULATION CONSULT NOTE  Pharmacy Consult for warfarin management Indication: DVT  Allergies  Allergen Reactions   Adhesive [Tape] Other (See Comments)    Pt reports, when removing tape from skin most tapes pull her skin off. Ok to use paper tape   Avapro  [Irbesartan ] Anaphylaxis    Facial swelling   Morphine  And Codeine  Nausea And Vomiting   Other Hives    Chlorhexadine wipes/CHG wipes,    Latex Rash    Exam gloves/ does not react around elastic and lips don't swell when blowing balloons    Patient Measurements: Height: 5' 3 (160 cm) IBW/kg (Calculated) : 52.4  Vital Signs: Temp: 98.5 F (36.9 C) (07/28 0508) BP: 110/61 (07/28 0508) Pulse Rate: 92 (07/28 0508)  Labs: Recent Labs    12/17/23 0306 12/18/23 0401 12/19/23 0137  HGB 10.3* 10.7* 11.1*  HCT 32.8* 33.8* 34.5*  PLT 129* 132* 133*  LABPROT 25.5* 27.2* 27.6*  INR 2.2* 2.4* 2.4*  CREATININE 0.74 0.74 0.84    Estimated Creatinine Clearance: 62.5 mL/min (by C-G formula based on SCr of 0.84 mg/dL).   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, 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   CHF (congestive heart failure) (HCC)    followed colon resection, wouldn't let me get up   Chronic anticoagulation    Degenerative disc disease, lumbar    Diverticulitis 2018   Diverticulosis    DVT of lower extremity, bilateral (HCC) 2018   Essential hypertension    History of being hospitalized    1 month ago for 3 days, vomiting, and kink in upper intestine   History of hiatal hernia    Mediastinal adenopathy 04/2023   MRSA nasal colonization    Non-small cell lung cancer (HCC) 2022   right   Obesity (BMI 30-39.9)    Osteoarthritis    Osteoporosis    PE (pulmonary thromboembolism) (HCC) 2018   Pneumonia    Small bowel obstruction (HCC) 03/20/2023   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HCC)    Type 2 diabetes mellitus with  obesity (HCC)    Vertigo     Medications:  Currently on Warfarin 5 mg po daily (TWD 35mg ) Multiple recent weekly warfarin dose decreases since 10/2023 TWD 35mg  per 7/16 [facility] clinic visit note (current regimen) INR 2.8 TWD 41mg  per 6/24 note INR 3.8 TWD 45mg  per 6/10 note   Assessment: 73 yo presented to ED with complaint of increased shortness of breath.  Was recently hospitalized 7/16-7/18 with pneumonia. During workup patient was also found to have new acute DVT despite being on warfarin.  Pharmacy consulted to manage Warfarin. Patient has required multiple recent decreases in total weekly warfarin dose per recent anticoag clinic visit notes. Provider spoke with patient about possibility of transitioning to Eliquis  and patient wants a day to think it over due to cost issues.  Interacting medication: Azithromycin (completed 7/25), ropirinole(on PTA)    Goal of Therapy:  INR 2-3 Monitor platelets by anticoagulation protocol: Yes    Date INR Warfarin Dose  7/20 2.9 5 mg (PTA)  7/21 3.0 1.5 mg  7/22 2.6 2.5 mg  7/23 2.1 5 mg  7/24 1.9 6 mg  7/25 1.9 6 mg  7/26 2.2  5 mg  7/27 2.4 5 mg  7/28 2.4          Plan:  INR therapeutic.  Will order warfarin 5mg  PO  x 1 today Daily INR while admitted CBC at least every 72 hours   Allean Haas PharmD Clinical Pharmacist 12/19/2023

## 2023-12-19 NOTE — Evaluation (Signed)
 Physical Therapy Evaluation Patient Details Name: Normagene Harvie MRN: [id number] DOB: 1951-01-10 Today's Date: 12/19/2023  History of Present Illness  73 y.o. female with medical history significant of T2DM, HTN, history of PE, non-small cell lung cancer with recurrence who was recently admitted with sepsis related to pneumonia.Found to have R LE DVT on 7/20.  Clinical Impression  Pt is a pleasant 73 year old female who was admitted for hypoxic resp failure. Pt performs transfers and ambulation with supervision and RW. Pt demonstrates deficits with endurance/mobility/balance. Would benefit from skilled PT to address above deficits and promote optimal return to PLOF. Pt will continue to receive skilled PT services while admitted and will defer to TOC/care team for updates regarding disposition planning.  SaO2 on room air at rest = 91% SaO2 on room air while ambulating = 80% SaO2 on 6 liters of O2 while ambulating = 91%; returned back to 2.5L seated at EOB at 94% after prolonged rest break         If plan is discharge home, recommend the following: Assistance with cooking/housework;Assist for transportation;Help with stairs or ramp for entrance   Can travel by private vehicle        Equipment Recommendations None recommended by PT  Recommendations for Other Services       Functional Status Assessment Patient has had a recent decline in their functional status and demonstrates the ability to make significant improvements in function in a reasonable and predictable amount of time.     Precautions / Restrictions Precautions Precautions: Fall Recall of Precautions/Restrictions: Intact Restrictions Weight Bearing Restrictions Per Provider Order: No      Mobility  Bed Mobility               General bed mobility comments: received seated at EOB upon arrival    Transfers Overall transfer level: Needs assistance Equipment used: Rolling walker (2 wheels) Transfers: Sit  to/from Stand Sit to Stand: Supervision           General transfer comment: safe technique with ability to push from seated surface    Ambulation/Gait Ambulation/Gait assistance: Supervision Gait Distance (Feet): 50 Feet Assistive device: Rolling walker (2 wheels) Gait Pattern/deviations: Step-through pattern       General Gait Details: very slow gait pattern, flexed posture, poor endurance. Requires standing rest break. Distance limited due to fatigue  Stairs            Wheelchair Mobility     Tilt Bed    Modified Rankin (Stroke Patients Only)       Balance Overall balance assessment: Needs assistance Sitting-balance support: Feet unsupported, No upper extremity supported Sitting balance-Leahy Scale: Good     Standing balance support: During functional activity, No upper extremity supported, Single extremity supported Standing balance-Leahy Scale: Fair                               Pertinent Vitals/Pain Pain Assessment Pain Assessment: No/denies pain    Home Living Family/patient expects to be discharged to:: Private residence Living Arrangements: Spouse/significant other Available Help at Discharge: Family Type of Home: House Home Access: Stairs to enter Entrance Stairs-Rails: Right;Left;Can reach both Entrance Stairs-Number of Steps: 3+1   Home Layout: One level Home Equipment: Rollator (4 wheels);Cane - single point;Shower seat Additional Comments: Pt works 5x/wk for 2 hours and 1 hour on saturday. 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 ADLs Comments: Pt reports Ind with self care tasks at baseline with use of AD at needed.     Extremity/Trunk Assessment    Upper Extremity Assessment Upper Extremity Assessment: Overall WFL for tasks assessed    Lower Extremity Assessment Lower Extremity Assessment: Generalized weakness       Communication   Communication Communication: No apparent difficulties    Cognition Arousal: Alert Behavior During Therapy: WFL for tasks assessed/performed   PT - Cognitive impairments: No apparent impairments                       PT - Cognition Comments: pleasant and agreeable to session Following commands: Intact       Cueing Cueing Techniques: Verbal cues     General Comments      Exercises     Assessment/Plan    PT Assessment Patient needs continued PT services  PT Problem List Decreased strength;Decreased activity tolerance;Decreased balance;Decreased mobility;Decreased range of motion       PT Treatment Interventions DME instruction;Balance training;Modalities;Neuromuscular re-education;Gait training;Stair training;Functional mobility training;Therapeutic activities;Therapeutic exercise;Patient/family education    PT Goals (Current goals can be found in the Care Plan section)  Acute Rehab PT Goals Patient Stated Goal: get better PT Goal Formulation: With patient Time For Goal Achievement: 01/02/24 Potential to Achieve Goals: Good    Frequency Min 2X/week     Co-evaluation               AM-PAC PT 6 Clicks Mobility  Outcome Measure Help needed turning from your back to your side while in a flat bed without using bedrails?: None Help needed moving from lying on your back to sitting on the side of a flat bed without using bedrails?: None Help needed moving to and from a bed to a chair (including a wheelchair)?: A Little Help needed standing up from a chair using your arms (e.g., wheelchair or bedside chair)?: A Little Help needed to walk in hospital room?: A Little Help needed climbing 3-5 steps with a railing? : A Lot 6 Click Score: 19    End of Session Equipment  Utilized During Treatment: Oxygen Activity Tolerance: Patient limited by fatigue Patient left: in bed (seated at EOB) Nurse Communication: Mobility status PT Visit Diagnosis: Unsteadiness on feet (R26.81);Muscle weakness (generalized) (M62.81)    Time: 9084-9059 PT Time Calculation (min) (ACUTE ONLY): 25 min   Charges:   PT Evaluation $PT Eval Low Complexity: 1 Low PT Treatments $Gait Training: 8-22 mins PT General Charges $$ ACUTE PT VISIT: 1 Visit         Corean Dade, PT, DPT, [facility] [phone]   Stella Bortle 12/19/2023, 10:19 AM

## 2023-12-19 NOTE — Progress Notes (Signed)
 PROGRESS NOTE    Kara [name]  FMW:[id number] DOB: December 16, 1950 DOA: 12/11/2023 PCP: Sadie Manna, MD   Assessment & Plan:   Principal Problem:   Hypoxic respiratory failure (HCC) Active Problems:   Diabetic peripheral neuropathy (HCC)   Recurrent non-small cell lung cancer (NSCLC) (HCC)   Uncontrolled type 2 diabetes mellitus with hyperglycemia, without long-term current use of insulin  (HCC)   Chronic anticoagulation ([facility]ADIN )   Long term current use of opiate analgesic   Benign essential hypertension   Acute deep vein thrombosis (DVT) of distal end of right lower extremity (HCC)   Chronic diastolic (congestive) heart failure (HCC)   Chemotherapy induced neutropenia (HCC)  Assessment and Plan: Acute hypoxic respiratory failure:intermittent O2 desat to 88% while on 2L O2.  CTA neg PE. Completed abx course. Unable to wean off of supplemental oxygen and will d/c home w/ oxygen   Likely pneumonia: completed abx course. Continue on IV steroids, bronchodilators & encourage incentive spirometry. Pulmon following and recs apprec  Likely ileus: started on miralax  BID   Chronic pain: of right hip. Continue on home dose of norco, gabapentin    Diabetic peripheral neuropathy: continue on home dose of gabapentin    Recurrent non-small cell lung cancer: continue on home dose of tagrisso . Will need f/u outpatient w/ onco    DM2: fair control, HbA1c 7.2. Continue on SSI w/ accuchecks    Hx of PE: continue on home dose of warfarin. Pt wants to think over taking eliquis  instead of warfarin. Pt is taking warfarin b/c it is cheaper     Acute right DVT: etiology unclear. Unclear if pt failed warfarin or if INR was not therapeutic. Continue on warfarin. Pt is taking warfarin b/c it is cheaper. Pt wants to think over taking eliquis  instead of warfarin    Chemotherapy induced pancytopenia: labile. No need for a transfusion currently    Chronic diastolic CHF: appears compensated. Monitor  I/Os  HTN: holding home dose of irbesartan  as BP is low normal    Obesity: BMI 33.1. Would benefit from weight loss       DVT prophylaxis: warfarin Code Status: full  Family Communication: Disposition Plan: likely d/c back home  Level of care: Med-Surg  Status is: Inpatient Remains inpatient appropriate because: severity of illness, requiring IV steroids     Consultants:  Pulmon, Dr. Fernand (requested by pt)  Procedures:   Antimicrobials:    Subjective: Pt c/o shortness of breath   Objective: Vitals:   12/18/23 2040 12/18/23 2306 12/19/23 0508 12/19/23 0753  BP:  (!) 108/58 110/61   Pulse:  94 92   Resp:  18 18   Temp:  98.3 F (36.8 C) 98.5 F (36.9 C)   TempSrc:      SpO2: 94% 93% 91% 92%  Height: 5' 3 (1.6 m)       Intake/Output Summary (Last 24 hours) at 12/19/2023 9180 Last data filed at 12/18/2023 1200 Gross per 24 hour  Intake --  Output 1300 ml  Net -1300 ml    There were no vitals filed for this visit.  Examination:  General exam: appears calm & comfortable  Respiratory system: decreased breath sounds b/l. Intermittent wheezes Cardiovascular system: S1/S2+. No rubs or clicks  Gastrointestinal system: abd is soft, NT, ND & hypoactive bowel sounds Central nervous system: alert & oriented. Moves all extremities  Psychiatry: judgement and insight appears at baseline. Flat mood and affect    Data Reviewed: I have personally reviewed following labs and imaging studies  CBC: Recent Labs  Lab 12/15/23 0526 12/16/23 0341 12/17/23 0306 12/18/23 0401 12/19/23 0137  WBC 3.8* 5.4 6.3 6.5 7.0  HGB 10.5* 10.4* 10.3* 10.7* 11.1*  HCT 32.9* 31.7* 32.8* 33.8* 34.5*  MCV 92.9 92.7 93.2 92.9 92.5  PLT 104* 110* 129* 132* 133*   Basic Metabolic Panel: Recent Labs  Lab 12/13/23 0327 12/15/23 0526 12/16/23 0341 12/17/23 0306 12/18/23 0401 12/19/23 0137  NA 139 136 137 133* 137 137  K 3.9 4.2 4.6 4.2 4.0 4.6  CL 105 101 101 101 102 103   CO2 25 26 27 27 27 26   GLUCOSE 131* 170* 220* 227* 203* 277*  BUN 10 10 13 18 20 19   CREATININE 0.64 0.52 0.69 0.74 0.74 0.84  CALCIUM  8.2* 8.2* 8.6* 8.6* 8.7* 8.5*  MG 2.1  --   --   --   --   --    GFR: Estimated Creatinine Clearance: 62.5 mL/min (by C-G formula based on SCr of 0.84 mg/dL). Liver Function Tests: No results for input(s): AST, ALT, ALKPHOS, BILITOT, PROT, ALBUMIN in the last 168 hours.  No results for input(s): LIPASE, AMYLASE in the last 168 hours.  No results for input(s): AMMONIA in the last 168 hours. Coagulation Profile: Recent Labs  Lab 12/15/23 0526 12/16/23 0341 12/17/23 0306 12/18/23 0401 12/19/23 0137  INR 1.9* 1.9* 2.2* 2.4* 2.4*   Cardiac Enzymes: No results for input(s): CKTOTAL, CKMB, CKMBINDEX, TROPONINI in the last 168 hours. BNP (last 3 results) No results for input(s): PROBNP in the last 8760 hours. HbA1C: No results for input(s): HGBA1C in the last 72 hours. CBG: Recent Labs  Lab 12/17/23 1929 12/18/23 0824 12/18/23 1141 12/18/23 1719 12/18/23 2032  GLUCAP 354* 205* 236* 252* 261*   Lipid Profile: No results for input(s): CHOL, HDL, LDLCALC, TRIG, CHOLHDL, LDLDIRECT in the last 72 hours. Thyroid  Function Tests: No results for input(s): TSH, T4TOTAL, FREET4, T3FREE, THYROIDAB in the last 72 hours. Anemia Panel: No results for input(s): VITAMINB12, FOLATE, FERRITIN, TIBC, IRON, RETICCTPCT in the last 72 hours. Sepsis Labs: No results for input(s): PROCALCITON, LATICACIDVEN in the last 168 hours.   Recent Results (from the past 240 hours)  Blood culture (routine x 2)     Status: None   Collection Time: 12/11/23  9:06 PM   Specimen: BLOOD  Result Value Ref Range Status   Specimen Description BLOOD RIGHT [facility]  Final   Special Requests   Final    BOTTLES DRAWN AEROBIC AND ANAEROBIC Blood Culture adequate volume   Culture   Final    NO GROWTH 5 DAYS Performed  at [facility] [place], [address] [place], [place] [address]    Report Status 12/16/2023 FINAL  Final  Blood culture (routine x 2)     Status: None   Collection Time: 12/12/23  2:37 AM   Specimen: BLOOD  Result Value Ref Range Status   Specimen Description BLOOD BLOOD RIGHT ARM  Final   Special Requests   Final    BOTTLES DRAWN AEROBIC AND ANAEROBIC Blood Culture results may not be optimal due to an inadequate volume of blood received in culture bottles   Culture   Final    NO GROWTH 5 DAYS Performed at [facility], [address] [place], [place] [address]    Report Status 12/17/2023 FINAL  Final  Urine Culture     Status: None   Collection Time: 12/12/23  9:00 AM   Specimen: Urine, Clean Catch  Result Value  Ref Range Status   Specimen Description   Final    URINE, CLEAN CATCH Performed at [facility], [address] [place], [place] [address]    Special Requests   Final    NONE Reflexed from [id number] Performed at [facility], [address] [place], [place] [address]    Culture   Final    NO GROWTH Performed at [facility], [address], [place], [place] [address]    Report Status 12/13/2023 FINAL  Final         Radiology Studies: DG Abd Portable 1V Result Date: 12/18/2023 CLINICAL DATA:  Intractable nausea and vomiting. EXAM: PORTABLE ABDOMEN - 1 VIEW COMPARISON:  Abdominal x-ray 03/21/2023 FINDINGS: There are mildly dilated small bowel loops in the left abdomen. There is moderate gaseous distention of the stomach. No suspicious calcifications are identified. No acute osseous abnormalities are seen. There IMPRESSION: Mildly dilated small bowel loops in the left abdomen. Moderate gaseous distention of the stomach. Findings may be related to ileus or obstruction. Electronically Signed   By: Greig Pique M.D.   On: 12/18/2023 19:17        Scheduled Meds:  albuterol   2.5 mg Nebulization TID    budesonide -glycopyrrolate -formoterol   2 puff Inhalation BID   cholecalciferol   1,000 Units Oral Daily   cyanocobalamin   5,000 mcg Oral Daily   cyclobenzaprine   10 mg Oral QHS   feeding supplement  237 mL Oral BID BM   gabapentin   200 mg Oral TID with meals   gabapentin   300 mg Oral QHS   insulin  aspart  0-15 Units Subcutaneous TID WC   insulin  aspart  0-5 Units Subcutaneous QHS   magic mouthwash w/lidocaine   15 mL Oral TID   magnesium  oxide  400 mg Oral QHS   methylPREDNISolone  (SOLU-MEDROL ) injection  40 mg Intravenous Q24H   osimertinib  mesylate  80 mg Oral Daily   oxybutynin   5 mg Oral BID   pantoprazole   40 mg Oral Daily   polyethylene glycol  17 g Oral BID   potassium chloride  SA  20 mEq Oral Daily   rOPINIRole   1 mg Oral QHS   scopolamine   1 patch Transdermal Q72H   sodium chloride  flush  3 mL Intravenous Q12H   sucralfate   1 g Oral TID WC   warfarin  5 mg Oral ONCE-1600   Warfarin - Pharmacist Dosing Inpatient   Does not apply q1600   Continuous Infusions:     LOS: 8 days      Anthony CHRISTELLA Pouch, MD [facility] Pager [phone]  If 7PM-7AM, please contact night-coverage [email or web address] 12/19/2023, 8:19 AM

## 2023-12-20 ENCOUNTER — Encounter: Payer: Self-pay | Admitting: Oncology

## 2023-12-20 ENCOUNTER — Other Ambulatory Visit: Payer: Self-pay

## 2023-12-20 ENCOUNTER — Inpatient Hospital Stay

## 2023-12-20 ENCOUNTER — Encounter: Payer: Self-pay | Admitting: Internal Medicine

## 2023-12-20 DIAGNOSIS — J9601 Acute respiratory failure with hypoxia: Secondary | ICD-10-CM | POA: Diagnosis not present

## 2023-12-20 LAB — CBC
HCT: 31.5 % — ABNORMAL LOW (ref 36.0–46.0)
Hemoglobin: 10.1 g/dL — ABNORMAL LOW (ref 12.0–15.0)
MCH: 29.8 pg (ref 26.0–34.0)
MCHC: 32.1 g/dL (ref 30.0–36.0)
MCV: 92.9 fL (ref 80.0–100.0)
Platelets: 127 K/uL — ABNORMAL LOW (ref 150–400)
RBC: 3.39 MIL/uL — ABNORMAL LOW (ref 3.87–5.11)
RDW: 13.7 % (ref 11.5–15.5)
WBC: 5.9 K/uL (ref 4.0–10.5)
nRBC: 0 % (ref 0.0–0.2)

## 2023-12-20 LAB — BASIC METABOLIC PANEL WITH GFR
Anion gap: 8 (ref 5–15)
BUN: 22 mg/dL (ref 8–23)
CO2: 28 mmol/L (ref 22–32)
Calcium: 8.3 mg/dL — ABNORMAL LOW (ref 8.9–10.3)
Chloride: 102 mmol/L (ref 98–111)
Creatinine, Ser: 0.84 mg/dL (ref 0.44–1.00)
GFR, Estimated: >60 mL/min (ref >60)
Glucose, Bld: 231 mg/dL — ABNORMAL HIGH (ref 70–99)
Potassium: 4 mmol/L (ref 3.5–5.1)
Sodium: 138 mmol/L (ref 135–145)

## 2023-12-20 LAB — GLUCOSE, CAPILLARY
Glucose-Capillary: 162 mg/dL — ABNORMAL HIGH (ref 70–99)
Glucose-Capillary: 227 mg/dL — ABNORMAL HIGH (ref 70–99)

## 2023-12-20 LAB — PROTIME-INR
INR: 3 — ABNORMAL HIGH (ref 0.8–1.2)
Prothrombin Time: 32.4 s — ABNORMAL HIGH (ref 11.4–15.2)

## 2023-12-20 MED ORDER — PREDNISONE 20 MG PO TABS
40.0000 mg | ORAL_TABLET | Freq: Every day | ORAL | 0 refills | Status: AC
  Filled 2023-12-20: qty 10, 5d supply, fill #0

## 2023-12-20 MED ORDER — ALBUTEROL SULFATE (2.5 MG/3ML) 0.083% IN NEBU: 2.5000 mg | INHALATION_SOLUTION | Freq: Two times a day (BID) | RESPIRATORY_TRACT | Status: DC

## 2023-12-20 MED ORDER — WARFARIN SODIUM 3 MG PO TABS
3.0000 mg | ORAL_TABLET | Freq: Once | ORAL | Status: AC
  Administered 2023-12-20: 3 mg via ORAL
  Filled 2023-12-20: qty 1

## 2023-12-20 NOTE — Plan of Care (Signed)
  Problem: Education: Goal: Ability to describe self-care measures that may prevent or decrease complications (Diabetes Survival Skills Education) will improve Outcome: Progressing   Problem: Coping: Goal: Ability to adjust to condition or change in health will improve Outcome: Progressing   Problem: Skin Integrity: Goal: Risk for impaired skin integrity will decrease Outcome: Progressing

## 2023-12-20 NOTE — Discharge Summary (Addendum)
 Physician Discharge Summary  Kara [name] FMW:[id number] DOB: 1951-02-21 DOA: 12/11/2023  PCP: Sadie Manna, MD  Admit date: 12/11/2023 Discharge date: 12/20/2023  Admitted From: home  Disposition: home w/ home health  Recommendations for Outpatient Follow-up:  Follow up with PCP in 1-2 weeks F/u w/ pulmon, Dr. Fernand, in 1-2 weeks  Home Health: yes Equipment/Devices: 2L [place]  Discharge Condition: stable  CODE STATUS: full  Diet recommendation: Heart Healthy / Carb Modified  Brief/Interim Summary: HPI was taken from Dr. Fredirick: Kara [name] Height is a 73 y.o. adult with medical history significant of T2DM, HTN, history of PE, non-small cell lung cancer with recurrence who was recently admitted with sepsis related to pneumonia.  She was discharged home approximately 2 days ago on Levaquin  and was taking this as directed.  Additionally her lung cancer has recurred and she is on adjuvant Tagrisso  daily.  She returns with worsening shortness of breath.  In the ED she was noted to be tachypneic and hypoxic requiring 2 L of nasal cannula to maintain her sats.  Her labs were fairly stable and unremarkable including her lactic acid.  Her INR was elevated at 2.9 but she is on chronic Coumadin .  Chest x-ray showed a right hilar mass.  Workup also revealed a new right DVT.  Patient reports feeling like she is wheezing as well.  Patient reports nonproductive cough for the last 4 days.   Discharge Diagnoses:  Principal Problem:   Hypoxic respiratory failure (HCC) Active Problems:   Diabetic peripheral neuropathy (HCC)   Recurrent non-small cell lung cancer (NSCLC) (HCC)   Uncontrolled type 2 diabetes mellitus with hyperglycemia, without long-term current use of insulin  (HCC)   Chronic anticoagulation (COUMADIN )   Long term current use of opiate analgesic   Benign essential hypertension   Acute deep vein thrombosis (DVT) of distal end of right lower extremity (HCC)   Chronic diastolic  (congestive) heart failure (HCC)   Chemotherapy induced neutropenia (HCC)  Acute hypoxic respiratory failure:intermittent O2 desat to 88% while on 2L O2.  CTA neg PE. Completed abx course. Unable to wean off of supplemental oxygen and will d/c home w/ oxygen, 2L [place]  Likely pneumonia: completed abx course. Continue steroids, bronchodilators & encourage incentive spirometry. Pulmon following and recs apprec  Likely ileus: had BMs. No more nausea or vomiting. Resolving    Chronic pain: of right hip. Continue on home dose of norco, gabapentin    Diabetic peripheral neuropathy: continue on home dose of gabapentin    Recurrent non-small cell lung cancer: continue on home dose of tagrisso . Will need f/u outpatient w/ onco    DM2: fair control, HbA1c 7.2. Continue on SSI w/ accuchecks    Hx of PE: continue on home dose of warfarin. Pt does not want to take eliquis  or xarelto  and wants to keep taking warfarin as it is cheaper for her   Acute right DVT: etiology unclear. Unclear if pt failed warfarin or if INR was not therapeutic. Continue on warfarin. Pt wants to keep taking wafarin as it is cheaper for her   Chemotherapy induced pancytopenia: labile. No need for a transfusion currently    Chronic diastolic CHF: appears compensated. Monitor I/Os  HTN: restart home dose of irbesartan     Obesity: BMI 33.1. Would benefit from weight loss   Discharge Instructions  Discharge Instructions     Diet - low sodium heart healthy   Complete by: As directed    Diet Carb Modified   Complete by: As directed  Discharge instructions   Complete by: As directed    F/u w/ PCP in 1-2 weeks. F/u w/ pulmon, Dr. Fernand, in 1-2 weeks. F/u w/ onco at previously scheduled appointment   Increase activity slowly   Complete by: As directed       Allergies as of 12/20/2023       Reactions   Adhesive [tape] Other (See Comments)   Pt reports, when removing tape from skin most tapes pull her skin off. Ok to use  paper tape   Avapro  [irbesartan ] Anaphylaxis   Facial swelling   Morphine  And Codeine  Nausea And Vomiting   Other Hives   Chlorhexadine wipes/CHG wipes,    Latex Rash   Exam gloves/ does not react around elastic and lips don't swell when blowing balloons        Medication List     STOP taking these medications    HYDROcodone -acetaminophen  5-325 MG tablet Commonly known as: NORCO/VICODIN   levofloxacin  750 MG tablet Commonly known as: Levaquin    potassium chloride  SA 20 MEQ tablet Commonly known as: KLOR-CON  M       TAKE these medications    bisacodyl  10 MG suppository Commonly known as: DULCOLAX Place 1 suppository (10 mg total) rectally as needed for moderate constipation.   candesartan 16 MG tablet Commonly known as: ATACAND Take 16 mg by mouth in the morning.   cholecalciferol  25 MCG (1000 UNIT) tablet Commonly known as: VITAMIN D3 Take 1,000 Units by mouth in the morning.   cyclobenzaprine  10 MG tablet Commonly known as: FLEXERIL  Take 10 mg by mouth at bedtime.   gabapentin  100 MG capsule Commonly known as: NEURONTIN  Take 200 mg by mouth with breakfast, with lunch, and with evening meal.   gabapentin  300 MG capsule Commonly known as: NEURONTIN  Take 300 mg by mouth at bedtime.   glipiZIDE  10 MG tablet Commonly known as: GLUCOTROL  Take 10 mg by mouth daily before breakfast.   Magnesium  Oxide -Mg Supplement 500 MG Tabs Take 1 tablet by mouth at bedtime.   omeprazole  20 MG capsule Commonly known as: PRILOSEC Take 1 capsule (20 mg total) by mouth daily.   ondansetron  8 MG tablet Commonly known as: ZOFRAN  TAKE 1 TABLET BY MOUTH EVERY 8 HOURS AS NEEDED FOR NAUSEA OR VOMITING. START ON THE 3RD DAY AFTER CHEMOTHERAPY   OneTouch Ultra Test test strip Generic drug: glucose blood   osimertinib  mesylate 80 MG tablet Commonly known as: Tagrisso  Take 1 tablet (80 mg total) by mouth daily.   oxybutynin  5 MG tablet Commonly known as: DITROPAN  Take 1  tablet by mouth 2 (two) times daily.   predniSONE  20 MG tablet Commonly known as: DELTASONE  Take 2 tablets (40 mg total) by mouth daily with breakfast for 5 days.   prochlorperazine  10 MG tablet Commonly known as: COMPAZINE  Take 1 tablet (10 mg total) by mouth every 6 (six) hours as needed for nausea or vomiting.   rOPINIRole  1 MG tablet Commonly known as: REQUIP  Take 1 mg by mouth at bedtime.   sucralfate  1 g tablet Commonly known as: Carafate  Take 1 tablet (1 g total) by mouth 3 (three) times daily with meals. Dissolve tablet in 4 tablesppons warm water swish and swallow 30 min before meals. What changed: additional instructions   Trulicity 1.5 MG/0.5ML Soaj Generic drug: Dulaglutide Inject 1.5 mg into the skin once a week. Saturday   Vitamin B-12 5000 MCG Tbdp Take 5,000 mcg by mouth in the morning.   warfarin 5 MG tablet  Commonly known as: COUMADIN  Take 5 mg by mouth daily.               Durable Medical Equipment  (From admission, onward)           Start     Ordered   12/19/23 0953  For home use only DME oxygen  Once       Question Answer Comment  Length of Need 12 Months   Mode or (Route) Nasal cannula   Liters per Minute 2   Frequency Continuous (stationary and portable oxygen unit needed)   Oxygen conserving device Yes   Oxygen delivery system Gas      12/19/23 0952            Allergies  Allergen Reactions   Adhesive [Tape] Other (See Comments)    Pt reports, when removing tape from skin most tapes pull her skin off. Ok to use paper tape   Avapro  [Irbesartan ] Anaphylaxis    Facial swelling   Morphine  And Codeine  Nausea And Vomiting   Other Hives    Chlorhexadine wipes/CHG wipes,    Latex Rash    Exam gloves/ does not react around elastic and lips don't swell when blowing balloons    Consultations: Pulmon (requested by pt)   Procedures/Studies: DG Abd Portable 1V Result Date: 12/18/2023 CLINICAL DATA:  Intractable nausea and  vomiting. EXAM: PORTABLE ABDOMEN - 1 VIEW COMPARISON:  Abdominal x-ray 03/21/2023 FINDINGS: There are mildly dilated small bowel loops in the left abdomen. There is moderate gaseous distention of the stomach. No suspicious calcifications are identified. No acute osseous abnormalities are seen. There IMPRESSION: Mildly dilated small bowel loops in the left abdomen. Moderate gaseous distention of the stomach. Findings may be related to ileus or obstruction. Electronically Signed   By: Greig Pique M.D.   On: 12/18/2023 19:17   CT Angio Chest Pulmonary Embolism (PE) W or WO Contrast Result Date: 12/12/2023 CLINICAL DATA:  Pulmonary embolism (PE) suspected, high prob. 73 y.o. adult who returns 2 days after discharge with worsening shortness of breath chest pain found to be hypoxic EXAM: CT ANGIOGRAPHY CHEST WITH CONTRAST TECHNIQUE: Multidetector CT imaging of the chest was performed using the standard protocol during bolus administration of intravenous contrast. Multiplanar CT image reconstructions and MIPs were obtained to evaluate the vascular anatomy. RADIATION DOSE REDUCTION: This exam was performed according to the departmental dose-optimization program which includes automated exposure control, adjustment of the mA and/or kV according to patient size and/or use of iterative reconstruction technique. CONTRAST:  OMNIPAQUE  IOHEXOL  350 MG/ML SOLN COMPARISON:  Chest x-ray 12/11/2023, CT chest 10/06/2023, PET CT 06/03/2023 FINDINGS: Cardiovascular: Satisfactory opacification of the pulmonary arteries to the segmental level. No evidence of pulmonary embolism. The main pulmonary artery is normal in caliber. Left and right main pulmonary arteries are enlarged in caliber. Normal heart size. No significant pericardial effusion. The thoracic aorta is normal in caliber. Moderate atherosclerotic plaque. Atherosclerotic plaque of the thoracic aorta. No coronary artery calcifications. Mediastinum/Nodes: No enlarged  mediastinal, hilar, or axillary lymph nodes. Thyroid  gland, trachea, and esophagus demonstrate no significant findings. Lungs/Pleura: Interval worsening of Peribronchovascular patchy ground-glass and consolidative opacities most prominent within the right upper lobe. Persistent right perihilar masslike airspace opacity within the right upper lobe measuring 6 x 5.7 cm. Bilateral trace pleural effusion. No pneumothorax. Upper Abdomen: Cholelithiasis. Musculoskeletal: No chest wall abnormality. No suspicious lytic or blastic osseous lesions. No acute displaced fracture. Review of the MIP images confirms the above findings.  IMPRESSION: 1. No pulmonary embolus. 2. Enlarged left and right pulmonary arteries-correlate for pulmonary hypertension. 3. Interval worsening of peribronchovascular patchy ground-glass and consolidative opacities most prominent within the right upper lobe. Finding may represent infection/inflammation versus alveolar hemorrhage. 4. Persistent right perihilar masslike airspace opacity within the right upper lobe measuring 6 x 5.7 cm. Finding consistent with post treatment been like consolidation and fibrosis. Recommend attention on follow-up PET CT. 5. Mild trace pleural effusions. Electronically Signed   By: Morgane  Naveau M.D.   On: 12/12/2023 00:19   US  Venous Img Lower Bilateral Result Date: 12/11/2023 EXAM: ULTRASOUND DUPLEX OF THE BILATERAL LOWER EXTREMITY VEINS TECHNIQUE: Duplex ultrasound using B-mode/gray scaled imaging and Doppler spectral analysis and color flow was obtained of the deep venous structures of the bilateral lower extremity. COMPARISON: None. CLINICAL HISTORY: Bilateral leg pain. FINDINGS: No deep venous thrombosis on the left. Occlusive thrombus in the right popliteal vein extending to the posterior tibial and peroneal veins of the calf. IMPRESSION: 1. Occlusive thrombus in the right popliteal vein extending to the posterior tibial and peroneal veins of the calf. 2. No  deep venous thrombosis on the left. Electronically signed by: Pinkie Pebbles MD 12/11/2023 10:04 PM EDT RP Workstation: [id number]   DG Chest 1 View Result Date: 12/11/2023 CLINICAL DATA:  sob EXAM: CHEST  1 VIEW COMPARISON:  Chest x-ray 08/27/2023, CT chest 12/08/2023 FINDINGS: The heart and mediastinal contours are grossly unchanged. Atherosclerotic plaque. Redemonstration of right hilar masslike airspace opacity. Patchy airspace opacity of left lung. No pulmonary edema. No pleural effusion. No pneumothorax. No acute osseous abnormality. IMPRESSION: 1. Redemonstration of right hilar masslike airspace opacity that is better evaluated on CT chest 12/08/2023. 2. Patchy airspace opacity of left lung. 3.  Aortic Atherosclerosis (ICD10-I70.0). Electronically Signed   By: Morgane  Naveau M.D.   On: 12/11/2023 21:47   CT Angio Chest PE W and/or Wo Contrast Result Date: 12/08/2023 CLINICAL DATA:  Acute chest and abdominal pain, history of recent fall, initial encounter EXAM: CT ANGIOGRAPHY CHEST CT ABDOMEN AND PELVIS WITH CONTRAST TECHNIQUE: Multidetector CT imaging of the chest was performed using the standard protocol during bolus administration of intravenous contrast. Multiplanar CT image reconstructions and MIPs were obtained to evaluate the vascular anatomy. Multidetector CT imaging of the abdomen and pelvis was performed using the standard protocol during bolus administration of intravenous contrast. RADIATION DOSE REDUCTION: This exam was performed according to the departmental dose-optimization program which includes automated exposure control, adjustment of the mA and/or kV according to patient size and/or use of iterative reconstruction technique. CONTRAST:  OMNIPAQUE  IOHEXOL  350 MG/ML SOLN COMPARISON:  10/06/2023 FINDINGS: CTA CHEST FINDINGS Cardiovascular: Atherosclerotic calcifications of the thoracic aorta are noted. No aneurysmal dilatation or dissection is seen. No cardiac enlargement is  noted. The pulmonary artery shows a normal branching pattern bilaterally. No filling defect to suggest pulmonary embolism is noted. Mediastinum/Nodes: Thoracic inlet is within normal limits. Small mediastinal nodes are seen near the AP window and along the left pulmonary artery. These were not well visualized on the prior exam. These may be reactive in nature as they are not significant by size criteria. Previously seen right paratracheal nodes are less prominent on today's exam. The esophagus as visualized is within normal limits. Lungs/Pleura: The left lung is well aerated without focal infiltrate or sizable effusion. Right lung again demonstrates right hilar and upper lobe density similar to that seen on the prior exam consistent with post treatment changes. Some patchy opacities are noted in  the right upper lobe as well as in the medial aspect of the right middle lobe and right lower lobe new from the prior exam felt to represent acute infiltrate. Minimal right pleural effusion is noted. No parenchymal nodule is seen. Musculoskeletal: Degenerative changes of the thoracic spine are seen. No compression deformity is seen. Review of the MIP images confirms the above findings. CT ABDOMEN and PELVIS FINDINGS Hepatobiliary: Liver is within normal limits. Gallbladder is well distended with multiple dependent gallstones stable from the prior exam. Pancreas: Unremarkable. No pancreatic ductal dilatation or surrounding inflammatory changes. Spleen: Normal in size without focal abnormality. Adrenals/Urinary Tract: Adrenal glands are within normal limits. Kidneys demonstrate a normal enhancement pattern bilaterally. No obstructive changes are seen. The bladder is well distended. Stomach/Bowel: No obstructive or inflammatory changes of the colon are noted. A loop of transverse colon extends into a large ventral hernia. This is stable in appearance from the prior exam. No obstructive changes are seen. The appendix is not  visualized consistent with a prior surgical history. Small bowel and stomach are unremarkable. Vascular/Lymphatic: Aortic atherosclerosis. No enlarged abdominal or pelvic lymph nodes. Reproductive: Uterus and bilateral adnexa are unremarkable. Other: Ventral hernia is again seen and stable with a loop of transverse colon within. No free fluid is noted. No abdominopelvic ascites. Musculoskeletal: Degenerative changes of lumbar spine are seen. Review of the MIP images confirms the above findings. IMPRESSION: CTA of the chest: No evidence of pulmonary emboli. Post treatment changes in the right hilum and extending into the right upper lobe similar to that seen on the prior exam. There are some new peripheral airspace opacities identified consistent with acute multifocal infiltrate. CT of the abdomen and pelvis: Cholelithiasis without complicating factors. Large ventral hernia containing transverse colon without obstructive change. Electronically Signed   By: Oneil Devonshire M.D.   On: 12/08/2023 01:48   CT ABDOMEN PELVIS W CONTRAST Result Date: 12/08/2023 CLINICAL DATA:  Acute chest and abdominal pain, history of recent fall, initial encounter EXAM: CT ANGIOGRAPHY CHEST CT ABDOMEN AND PELVIS WITH CONTRAST TECHNIQUE: Multidetector CT imaging of the chest was performed using the standard protocol during bolus administration of intravenous contrast. Multiplanar CT image reconstructions and MIPs were obtained to evaluate the vascular anatomy. Multidetector CT imaging of the abdomen and pelvis was performed using the standard protocol during bolus administration of intravenous contrast. RADIATION DOSE REDUCTION: This exam was performed according to the departmental dose-optimization program which includes automated exposure control, adjustment of the mA and/or kV according to patient size and/or use of iterative reconstruction technique. CONTRAST:  OMNIPAQUE  IOHEXOL  350 MG/ML SOLN COMPARISON:  10/06/2023 FINDINGS: CTA  CHEST FINDINGS Cardiovascular: Atherosclerotic calcifications of the thoracic aorta are noted. No aneurysmal dilatation or dissection is seen. No cardiac enlargement is noted. The pulmonary artery shows a normal branching pattern bilaterally. No filling defect to suggest pulmonary embolism is noted. Mediastinum/Nodes: Thoracic inlet is within normal limits. Small mediastinal nodes are seen near the AP window and along the left pulmonary artery. These were not well visualized on the prior exam. These may be reactive in nature as they are not significant by size criteria. Previously seen right paratracheal nodes are less prominent on today's exam. The esophagus as visualized is within normal limits. Lungs/Pleura: The left lung is well aerated without focal infiltrate or sizable effusion. Right lung again demonstrates right hilar and upper lobe density similar to that seen on the prior exam consistent with post treatment changes. Some patchy opacities are noted in  the right upper lobe as well as in the medial aspect of the right middle lobe and right lower lobe new from the prior exam felt to represent acute infiltrate. Minimal right pleural effusion is noted. No parenchymal nodule is seen. Musculoskeletal: Degenerative changes of the thoracic spine are seen. No compression deformity is seen. Review of the MIP images confirms the above findings. CT ABDOMEN and PELVIS FINDINGS Hepatobiliary: Liver is within normal limits. Gallbladder is well distended with multiple dependent gallstones stable from the prior exam. Pancreas: Unremarkable. No pancreatic ductal dilatation or surrounding inflammatory changes. Spleen: Normal in size without focal abnormality. Adrenals/Urinary Tract: Adrenal glands are within normal limits. Kidneys demonstrate a normal enhancement pattern bilaterally. No obstructive changes are seen. The bladder is well distended. Stomach/Bowel: No obstructive or inflammatory changes of the colon are noted. A  loop of transverse colon extends into a large ventral hernia. This is stable in appearance from the prior exam. No obstructive changes are seen. The appendix is not visualized consistent with a prior surgical history. Small bowel and stomach are unremarkable. Vascular/Lymphatic: Aortic atherosclerosis. No enlarged abdominal or pelvic lymph nodes. Reproductive: Uterus and bilateral adnexa are unremarkable. Other: Ventral hernia is again seen and stable with a loop of transverse colon within. No free fluid is noted. No abdominopelvic ascites. Musculoskeletal: Degenerative changes of lumbar spine are seen. Review of the MIP images confirms the above findings. IMPRESSION: CTA of the chest: No evidence of pulmonary emboli. Post treatment changes in the right hilum and extending into the right upper lobe similar to that seen on the prior exam. There are some new peripheral airspace opacities identified consistent with acute multifocal infiltrate. CT of the abdomen and pelvis: Cholelithiasis without complicating factors. Large ventral hernia containing transverse colon without obstructive change. Electronically Signed   By: Oneil Devonshire M.D.   On: 12/08/2023 01:48   CT HEAD WO CONTRAST ( ) Result Date: 12/08/2023 CLINICAL DATA:  Recent fall with headaches and neck pain, initial encounter EXAM: CT HEAD WITHOUT CONTRAST CT CERVICAL SPINE WITHOUT CONTRAST TECHNIQUE: Multidetector CT imaging of the head and cervical spine was performed following the standard protocol without intravenous contrast. Multiplanar CT image reconstructions of the cervical spine were also generated. RADIATION DOSE REDUCTION: This exam was performed according to the departmental dose-optimization program which includes automated exposure control, adjustment of the mA and/or kV according to patient size and/or use of iterative reconstruction technique. COMPARISON:  10/03/2023 FINDINGS: CT HEAD FINDINGS Brain: No evidence of acute infarction,  hemorrhage, hydrocephalus, extra-axial collection or mass lesion/mass effect. Vascular: No hyperdense vessel or unexpected calcification. Skull: Normal. Negative for fracture or focal lesion. Sinuses/Orbits: No acute finding. Other: None. CT CERVICAL SPINE FINDINGS Alignment: Within normal limits. Skull base and vertebrae: 7 cervical segments are well visualized. Vertebral body height is well maintained. Mild facet hypertrophic changes are seen. Minimal osteophytic changes are seen. No acute fracture or acute facet abnormality is noted. The odontoid is within normal limits. Soft tissues and spinal canal: Surrounding soft tissue structures are within normal limits. Upper chest: Visualized lung apices are unremarkable. Other: None IMPRESSION: CT of the head: No acute intracranial abnormality noted. CT of the cervical spine: Mild degenerative change without acute abnormality. Electronically Signed   By: Oneil Devonshire M.D.   On: 12/08/2023 01:38   CT Cervical Spine Wo Contrast Result Date: 12/08/2023 CLINICAL DATA:  Recent fall with headaches and neck pain, initial encounter EXAM: CT HEAD WITHOUT CONTRAST CT CERVICAL SPINE WITHOUT CONTRAST TECHNIQUE: Multidetector CT  imaging of the head and cervical spine was performed following the standard protocol without intravenous contrast. Multiplanar CT image reconstructions of the cervical spine were also generated. RADIATION DOSE REDUCTION: This exam was performed according to the departmental dose-optimization program which includes automated exposure control, adjustment of the mA and/or kV according to patient size and/or use of iterative reconstruction technique. COMPARISON:  10/03/2023 FINDINGS: CT HEAD FINDINGS Brain: No evidence of acute infarction, hemorrhage, hydrocephalus, extra-axial collection or mass lesion/mass effect. Vascular: No hyperdense vessel or unexpected calcification. Skull: Normal. Negative for fracture or focal lesion. Sinuses/Orbits: No acute  finding. Other: None. CT CERVICAL SPINE FINDINGS Alignment: Within normal limits. Skull base and vertebrae: 7 cervical segments are well visualized. Vertebral body height is well maintained. Mild facet hypertrophic changes are seen. Minimal osteophytic changes are seen. No acute fracture or acute facet abnormality is noted. The odontoid is within normal limits. Soft tissues and spinal canal: Surrounding soft tissue structures are within normal limits. Upper chest: Visualized lung apices are unremarkable. Other: None IMPRESSION: CT of the head: No acute intracranial abnormality noted. CT of the cervical spine: Mild degenerative change without acute abnormality. Electronically Signed   By: Oneil Devonshire M.D.   On: 12/08/2023 01:38   (Echo, Carotid, EGD, Colonoscopy, ERCP)    Subjective:   Discharge Exam: Vitals:   12/19/23 2032 12/20/23 0740  BP:  (!) 103/43  Pulse:  84  Resp:  16  Temp:  (!) 97.5 F (36.4 C)  SpO2: 94% 96%   Vitals:   12/19/23 1950 12/19/23 2017 12/19/23 2032 12/20/23 0740  BP:  (!) 128/51  (!) 103/43  Pulse:  92  84  Resp:  19  16  Temp:  (!) 97.5 F (36.4 C)  (!) 97.5 F (36.4 C)  TempSrc:      SpO2:  94% 94% 96%  Weight: 82.2 kg     Height:        General: Pt is alert, awake, not in acute distress Cardiovascular: S1/S2 +, no rubs, no gallops Respiratory: decreased breath sounds b/l  Abdominal: Soft, NT, obese, bowel sounds + Extremities:  no cyanosis    The results of significant diagnostics from this hospitalization (including imaging, microbiology, ancillary and laboratory) are listed below for reference.     Microbiology: Recent Results (from the past 240 hours)  Blood culture (routine x 2)     Status: None   Collection Time: 12/11/23  9:06 PM   Specimen: BLOOD  Result Value Ref Range Status   Specimen Description BLOOD RIGHT [facility]  Final   Special Requests   Final    BOTTLES DRAWN AEROBIC AND ANAEROBIC Blood Culture adequate volume   Culture    Final    NO GROWTH 5 DAYS Performed at [facility], [address] [place], [place] [address]    Report Status 12/16/2023 FINAL  Final  Blood culture (routine x 2)     Status: None   Collection Time: 12/12/23  2:37 AM   Specimen: BLOOD  Result Value Ref Range Status   Specimen Description BLOOD BLOOD RIGHT ARM  Final   Special Requests   Final    BOTTLES DRAWN AEROBIC AND ANAEROBIC Blood Culture results may not be optimal due to an inadequate volume of blood received in culture bottles   Culture   Final    NO GROWTH 5 DAYS Performed at [facility], [address] [place], [place] [address]    Report Status 12/17/2023 FINAL  Final  Urine Culture     Status: None   Collection Time: 12/12/23  9:00 AM   Specimen: Urine, Clean Catch  Result Value Ref Range Status   Specimen Description   Final    URINE, CLEAN CATCH Performed at [facility], [address] [place], [place] [address]    Special Requests   Final    NONE Reflexed from [id number] Performed at [facility], [address] [place], [place] [address]    Culture   Final    NO GROWTH Performed at [facility], [address], [place], [place] [address]    Report Status 12/13/2023 FINAL  Final     Labs: BNP (last 3 results) Recent Labs    05/09/23 1252 12/11/23 2106  BNP 19.5 96.8   Basic Metabolic Panel: Recent Labs  Lab 12/16/23 0341 12/17/23 0306 12/18/23 0401 12/19/23 0137 12/20/23 0541  NA 137 133* 137 137 138  K 4.6 4.2 4.0 4.6 4.0  CL 101 101 102 103 102  CO2 27 27 27 26 28   GLUCOSE 220* 227* 203* 277* 231*  BUN 13 18 20 19 22   CREATININE 0.69 0.74 0.74 0.84 0.84  CALCIUM  8.6* 8.6* 8.7* 8.5* 8.3*   Liver Function Tests: No results for input(s): AST, ALT, ALKPHOS, BILITOT, PROT, ALBUMIN in the last 168 hours. No results for input(s): LIPASE, AMYLASE in the last 168 hours. No results for input(s): AMMONIA in the last 168  hours. CBC: Recent Labs  Lab 12/16/23 0341 12/17/23 0306 12/18/23 0401 12/19/23 0137 12/20/23 0541  WBC 5.4 6.3 6.5 7.0 5.9  HGB 10.4* 10.3* 10.7* 11.1* 10.1*  HCT 31.7* 32.8* 33.8* 34.5* 31.5*  MCV 92.7 93.2 92.9 92.5 92.9  PLT 110* 129* 132* 133* 127*   Cardiac Enzymes: No results for input(s): CKTOTAL, CKMB, CKMBINDEX, TROPONINI in the last 168 hours. BNP: Invalid input(s): POCBNP CBG: Recent Labs  Lab 12/19/23 1153 12/19/23 1733 12/19/23 2019 12/20/23 0836 12/20/23 1132  GLUCAP 134* 380* 331* 162* 227*   D-Dimer No results for input(s): DDIMER in the last 72 hours. Hgb A1c No results for input(s): HGBA1C in the last 72 hours. Lipid Profile No results for input(s): CHOL, HDL, LDLCALC, TRIG, CHOLHDL, LDLDIRECT in the last 72 hours. Thyroid  function studies No results for input(s): TSH, T4TOTAL, T3FREE, THYROIDAB in the last 72 hours.  Invalid input(s): FREET3 Anemia work up No results for input(s): VITAMINB12, FOLATE, FERRITIN, TIBC, IRON, RETICCTPCT in the last 72 hours. Urinalysis    Component Value Date/Time   COLORURINE YELLOW (A) 12/12/2023 0900   APPEARANCEUR CLEAR (A) 12/12/2023 0900   APPEARANCEUR Clear 05/29/2013 0631   LABSPEC >1.046 (H) 12/12/2023 0900   LABSPEC 1.015 05/29/2013 0631   PHURINE 5.0 12/12/2023 0900   GLUCOSEU NEGATIVE 12/12/2023 0900   GLUCOSEU Negative 05/29/2013 0631   HGBUR NEGATIVE 12/12/2023 0900   BILIRUBINUR NEGATIVE 12/12/2023 0900   BILIRUBINUR Negative 05/29/2013 0631   KETONESUR 20 (A) 12/12/2023 0900   PROTEINUR NEGATIVE 12/12/2023 0900   NITRITE NEGATIVE 12/12/2023 0900   LEUKOCYTESUR NEGATIVE 12/12/2023 0900   LEUKOCYTESUR Negative 05/29/2013 0631   Sepsis Labs Recent Labs  Lab 12/17/23 0306 12/18/23 0401 12/19/23 0137 12/20/23 0541  WBC 6.3 6.5 7.0 5.9   Microbiology Recent Results (from the past 240 hours)  Blood culture (routine x 2)     Status: None    Collection Time: 12/11/23  9:06 PM   Specimen: BLOOD  Result Value Ref Range Status   Specimen  Description BLOOD RIGHT AC  Final   Special Requests   Final    BOTTLES DRAWN AEROBIC AND ANAEROBIC Blood Culture adequate volume   Culture   Final    NO GROWTH 5 DAYS Performed at [facility], [address] [place], [place] [address]    Report Status 12/16/2023 FINAL  Final  Blood culture (routine x 2)     Status: None   Collection Time: 12/12/23  2:37 AM   Specimen: BLOOD  Result Value Ref Range Status   Specimen Description BLOOD BLOOD RIGHT ARM  Final   Special Requests   Final    BOTTLES DRAWN AEROBIC AND ANAEROBIC Blood Culture results may not be optimal due to an inadequate volume of blood received in culture bottles   Culture   Final    NO GROWTH 5 DAYS Performed at [facility], [address], [place], [place] [address]    Report Status 12/17/2023 FINAL  Final  Urine Culture     Status: None   Collection Time: 12/12/23  9:00 AM   Specimen: Urine, Clean Catch  Result Value Ref Range Status   Specimen Description   Final    URINE, CLEAN CATCH Performed at [facility], [address] [place], [place] [address]    Special Requests   Final    NONE Reflexed from [id number] Performed at [facility], [address] [place], [place] [address]    Culture   Final    NO GROWTH Performed at [facility], [address], [place], [place] [address]    Report Status 12/13/2023 FINAL  Final     Time coordinating discharge: 36 minutes  SIGNED:   Anthony CHRISTELLA Pouch, MD  [facility] 12/20/2023, 1:10 PM Pager   If 7PM-7AM, please contact night-coverage [email or web address]

## 2023-12-20 NOTE — Progress Notes (Signed)
 Physical Therapy Treatment Patient Details Name: Kara [name] MRN: [id number] DOB: 1950/06/19 Today's Date: 12/20/2023   History of Present Illness 73 y.o. female with medical history significant of T2DM, HTN, history of PE, non-small cell lung cancer with recurrence who was recently admitted with sepsis related to pneumonia.Found to have R LE DVT on 7/20.    PT Comments  Bed mobility without assist.  She is able to stand mod I. Refuses gait belt.  Uses walker to walk 100' then an additional 92' back to room.  O2 at 2 lpm with sats 92%with 2 lpm O2 support.  Assist to manage O2 tank.  Discussed safety and cord awareness for home.  Voiced understanding.  Stated she has all assistive devices needed at home and plans to use rollator upon discharge.  O2 qualifying sats taken and documentated in separate note.   If plan is discharge home, recommend the following: Assistance with cooking/housework;Assist for transportation;Help with stairs or ramp for entrance   Can travel by private vehicle        Equipment Recommendations  None recommended by PT    Recommendations for Other Services       Precautions / Restrictions Precautions Precautions: Fall Recall of Precautions/Restrictions: Intact Restrictions Weight Bearing Restrictions Per Provider Order: No     Mobility  Bed Mobility Overal bed mobility: Modified Independent               Patient Response: Cooperative  Transfers Overall transfer level: Modified independent Equipment used: Rolling walker (2 wheels) Transfers: Sit to/from Stand Sit to Stand: Modified independent (Device/Increase time)                Ambulation/Gait Ambulation/Gait assistance: Supervision Gait Distance (Feet): 110 Feet Assistive device: Rolling walker (2 wheels)   Gait velocity: slightly decreased     General Gait Details: 110' 40' - rest needed due to SOB   Stairs             Wheelchair Mobility     Tilt  Bed Tilt Bed Patient Response: Cooperative  Modified Rankin (Stroke Patients Only)       Balance Overall balance assessment: Mild deficits observed, not formally tested Sitting-balance support: Feet unsupported, No upper extremity supported Sitting balance-Leahy Scale: Good     Standing balance support: During functional activity, No upper extremity supported, Single extremity supported Standing balance-Leahy Scale: Fair                              Hotel manager: No apparent difficulties  Cognition Arousal: Alert Behavior During Therapy: WFL for tasks assessed/performed   PT - Cognitive impairments: No apparent impairments                         Following commands: Intact      Cueing Cueing Techniques: Verbal cues  Exercises      General Comments        Pertinent Vitals/Pain Pain Assessment Pain Assessment: No/denies pain    Home Living                          Prior Function            PT Goals (current goals can now be found in the care plan section) Progress towards PT goals: Progressing toward goals    Frequency    Min 2X/week  PT Plan      Co-evaluation              AM-PAC PT 6 Clicks Mobility   Outcome Measure  Help needed turning from your back to your side while in a flat bed without using bedrails?: None Help needed moving from lying on your back to sitting on the side of a flat bed without using bedrails?: None Help needed moving to and from a bed to a chair (including a wheelchair)?: None Help needed standing up from a chair using your arms (e.g., wheelchair or bedside chair)?: None Help needed to walk in hospital room?: A Little Help needed climbing 3-5 steps with a railing? : A Lot 6 Click Score: 21    End of Session Equipment Utilized During Treatment: Oxygen Activity Tolerance: Patient limited by fatigue Patient left: in chair;with chair alarm set;with  call bell/phone within reach;with nursing/sitter in room Nurse Communication: Mobility status PT Visit Diagnosis: Unsteadiness on feet (R26.81);Muscle weakness (generalized) (M62.81)     Time: 9055-8999 PT Time Calculation (min) (ACUTE ONLY): 16 min  Charges:    $Gait Training: 8-22 mins PT General Charges $$ ACUTE PT VISIT: 1 Visit                   Lauraine Gills, PTA 12/20/23, 10:09 AM

## 2023-12-20 NOTE — Progress Notes (Signed)
 SaO2 on room air at rest = 86% SaO2 on room air while ambulating = 86 % - sats with bed mobility skills.  Did not return >88% so no further gait performed SaO2 on 2 liters of O2 while ambulating = 92%

## 2023-12-20 NOTE — Consult Note (Signed)
 PHARMACY - ANTICOAGULATION CONSULT NOTE  Pharmacy Consult for warfarin management Indication: DVT  Allergies  Allergen Reactions   Adhesive [Tape] Other (See Comments)    Pt reports, when removing tape from skin most tapes pull her skin off. Ok to use paper tape   Avapro  [Irbesartan ] Anaphylaxis    Facial swelling   Morphine  And Codeine  Nausea And Vomiting   Other Hives    Chlorhexadine wipes/CHG wipes,    Latex Rash    Exam gloves/ does not react around elastic and lips don't swell when blowing balloons    Patient Measurements: Height: 5' 3 (160 cm) Weight: 82.2 kg (181 lb 3.5 oz) IBW/kg (Calculated) : 52.4  Vital Signs: Temp: 97.5 F (36.4 C) (07/29 0740) BP: 103/43 (07/29 0740) Pulse Rate: 84 (07/29 0740)  Labs: Recent Labs    12/18/23 0401 12/19/23 0137 12/20/23 0541  HGB 10.7* 11.1* 10.1*  HCT 33.8* 34.5* 31.5*  PLT 132* 133* 127*  LABPROT 27.2* 27.6* 32.4*  INR 2.4* 2.4* 3.0*  CREATININE 0.74 0.84 0.84    Estimated Creatinine Clearance: 61.5 mL/min (by C-G formula based on SCr of 0.84 mg/dL).   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, 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   CHF (congestive heart failure) (HCC)    followed colon resection, wouldn't let me get up   Chronic anticoagulation    Degenerative disc disease, lumbar    Diverticulitis 2018   Diverticulosis    DVT of lower extremity, bilateral (HCC) 2018   Essential hypertension    History of being hospitalized    1 month ago for 3 days, vomiting, and kink in upper intestine   History of hiatal hernia    Mediastinal adenopathy 04/2023   MRSA nasal colonization    Non-small cell lung cancer (HCC) 2022   right   Obesity (BMI 30-39.9)    Osteoarthritis    Osteoporosis    PE (pulmonary thromboembolism) (HCC) 2018   Pneumonia    Small bowel obstruction (HCC) 03/20/2023   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HCC)     Type 2 diabetes mellitus with obesity (HCC)    Vertigo     Medications:  Currently on Warfarin 5 mg po daily (TWD 35mg ) Multiple recent weekly warfarin dose decreases since 10/2023 TWD 35mg  per 7/16 [facility] clinic visit note (current regimen) INR 2.8 TWD 41mg  per 6/24 note INR 3.8 TWD 45mg  per 6/10 note   Assessment: 73 yo presented to ED with complaint of increased shortness of breath.  Was recently hospitalized 7/16-7/18 with pneumonia. During workup patient was also found to have new acute DVT despite being on warfarin.  Pharmacy consulted to manage Warfarin. Patient has required multiple recent decreases in total weekly warfarin dose per recent anticoag clinic visit notes. Provider spoke with patient about possibility of transitioning to Eliquis  and patient wants a day to think it over due to cost issues.  Interacting medication: Azithromycin (completed 7/25), ropirinole(on PTA)    Goal of Therapy:  INR 2-3 Monitor platelets by anticoagulation protocol: Yes    Date INR Warfarin Dose  7/20 2.9 5 mg (PTA)  7/21 3.0 1.5 mg  7/22 2.6 2.5 mg  7/23 2.1 5 mg  7/24 1.9 6 mg  7/25 1.9 6 mg  7/26 2.2  5 mg  7/27 2.4 5 mg  7/28 2.4 5 mg   7/29 3.0  Plan:  INR at the high end of therapeutic.  Will order warfarin 3 mg PO x 1 today (will = 35mg  TWD over last 7 days)  Daily INR while admitted CBC at least every 72 hours   Estill CHRISTELLA Lutes, PharmD, BCPS Clinical Pharmacist 12/20/2023 8:27 AM

## 2023-12-20 NOTE — TOC Transition Note (Signed)
 Transition of Care [facility]) - Discharge Note   Patient Details  Name: Simaya Lumadue MRN: [id number] Date of Birth: 03/03/1951  Transition of Care [facility]) CM/SW Contact:  Alvaro Louder, LCSW Phone Number: 12/20/2023, 2:19 PM   Clinical Narrative:    LCSWA contacted DME [employer] and O2 has been delivered to patient at the bedside. Patient going Home with HHPT and OT through [facility]. Patients granddaughter will pick her up at discharge.   TOC signing off     Final next level of care: Home w Home Health Services Barriers to Discharge: No Barriers Identified   Patient Goals and CMS Choice            Discharge Placement                Patient to be transferred to facility by: [facility] and Services Additional resources added to the After Visit Summary for     Discharge Planning Services: CM Consult Post Acute Care Choice: Home Health          DME Arranged: Oxygen DME Agency: [employer] Date DME Agency Contacted: 12/20/23   Representative spoke with at DME Agency: [facility] HH Arranged: PT, OT HH Agency: [facility] Date HH Agency Contacted: 12/20/23   Representative spoke with at [facility] Agency: Shaun  Social Drivers of Health (SDOH) Interventions SDOH Screenings   Food Insecurity: No Food Insecurity (12/12/2023)  Housing: Low Risk  (12/12/2023)  Transportation Needs: No Transportation Needs (12/12/2023)  Utilities: Not At Risk (12/12/2023)  Depression (PHQ2-9): Low Risk  (11/29/2023)  Financial Resource Strain: Low Risk  (05/19/2023)   Received from [facility]  Social Connections: Moderately Isolated (12/12/2023)  Tobacco Use: Medium Risk (12/11/2023)     Readmission Risk Interventions     No data to display

## 2023-12-20 NOTE — Plan of Care (Signed)
  Problem: Coping: Goal: Ability to adjust to condition or change in health will improve Outcome: Progressing   Problem: Metabolic: Goal: Ability to maintain appropriate glucose levels will improve Outcome: Progressing   Problem: Tissue Perfusion: Goal: Adequacy of tissue perfusion will improve Outcome: Progressing   Problem: Clinical Measurements: Goal: Will remain free from infection Outcome: Progressing

## 2023-12-20 NOTE — TOC Progression Note (Signed)
 Transition of Care [facility]) - Progression Note    Patient Details  Name: Kara [name] MRN: [id number] Date of Birth: 06/08/1950  Transition of Care [facility]) CM/SW Contact  Alvaro Louder, [place] Phone Number: 12/20/2023, 11:09 AM  Clinical Narrative:   LCSWA met with patient at bedside to discuss HHPT and OT recommendations. The patient selected [facility]. LCSWA reached out to [facility] Coordinator and confirmed service at discharge.   LCSWA discussed DME O2 with patient and she indicated that she does want O2 at discharge.   TOC to follow for discharge                     Expected Discharge Plan and Services                                               Social Drivers of Health (SDOH) Interventions SDOH Screenings   Food Insecurity: No Food Insecurity (12/12/2023)  Housing: Low Risk  (12/12/2023)  Transportation Needs: No Transportation Needs (12/12/2023)  Utilities: Not At Risk (12/12/2023)  Depression (PHQ2-9): Low Risk  (11/29/2023)  Financial Resource Strain: Low Risk  (05/19/2023)   Received from [facility]  Social Connections: Moderately Isolated (12/12/2023)  Tobacco Use: Medium Risk (12/11/2023)    Readmission Risk Interventions     No data to display

## 2023-12-22 DIAGNOSIS — Z7901 Long term (current) use of anticoagulants: Secondary | ICD-10-CM | POA: Diagnosis not present

## 2023-12-22 DIAGNOSIS — M47817 Spondylosis without myelopathy or radiculopathy, lumbosacral region: Secondary | ICD-10-CM | POA: Diagnosis not present

## 2023-12-22 DIAGNOSIS — J9601 Acute respiratory failure with hypoxia: Secondary | ICD-10-CM | POA: Diagnosis not present

## 2023-12-22 DIAGNOSIS — M160 Bilateral primary osteoarthritis of hip: Secondary | ICD-10-CM | POA: Diagnosis not present

## 2023-12-22 DIAGNOSIS — I700 Atherosclerosis of aorta: Secondary | ICD-10-CM | POA: Diagnosis not present

## 2023-12-22 DIAGNOSIS — G8929 Other chronic pain: Secondary | ICD-10-CM | POA: Diagnosis not present

## 2023-12-22 DIAGNOSIS — Z7985 Long-term (current) use of injectable non-insulin antidiabetic drugs: Secondary | ICD-10-CM | POA: Diagnosis not present

## 2023-12-22 DIAGNOSIS — Z9981 Dependence on supplemental oxygen: Secondary | ICD-10-CM | POA: Diagnosis not present

## 2023-12-22 DIAGNOSIS — E1142 Type 2 diabetes mellitus with diabetic polyneuropathy: Secondary | ICD-10-CM | POA: Diagnosis not present

## 2023-12-22 DIAGNOSIS — C3490 Malignant neoplasm of unspecified part of unspecified bronchus or lung: Secondary | ICD-10-CM | POA: Diagnosis not present

## 2023-12-22 DIAGNOSIS — D701 Agranulocytosis secondary to cancer chemotherapy: Secondary | ICD-10-CM | POA: Diagnosis not present

## 2023-12-22 DIAGNOSIS — E1169 Type 2 diabetes mellitus with other specified complication: Secondary | ICD-10-CM | POA: Diagnosis not present

## 2023-12-22 DIAGNOSIS — J189 Pneumonia, unspecified organism: Secondary | ICD-10-CM | POA: Diagnosis not present

## 2023-12-22 DIAGNOSIS — Z86711 Personal history of pulmonary embolism: Secondary | ICD-10-CM | POA: Diagnosis not present

## 2023-12-22 DIAGNOSIS — I5032 Chronic diastolic (congestive) heart failure: Secondary | ICD-10-CM | POA: Diagnosis not present

## 2023-12-22 DIAGNOSIS — E1165 Type 2 diabetes mellitus with hyperglycemia: Secondary | ICD-10-CM | POA: Diagnosis not present

## 2023-12-22 DIAGNOSIS — Z86718 Personal history of other venous thrombosis and embolism: Secondary | ICD-10-CM | POA: Diagnosis not present

## 2023-12-22 DIAGNOSIS — C3411 Malignant neoplasm of upper lobe, right bronchus or lung: Secondary | ICD-10-CM | POA: Diagnosis not present

## 2023-12-22 DIAGNOSIS — K5790 Diverticulosis of intestine, part unspecified, without perforation or abscess without bleeding: Secondary | ICD-10-CM | POA: Diagnosis not present

## 2023-12-22 DIAGNOSIS — I110 Hypertensive heart disease with heart failure: Secondary | ICD-10-CM | POA: Diagnosis not present

## 2023-12-22 DIAGNOSIS — Z8701 Personal history of pneumonia (recurrent): Secondary | ICD-10-CM | POA: Diagnosis not present

## 2023-12-22 DIAGNOSIS — Z6832 Body mass index (BMI) 32.0-32.9, adult: Secondary | ICD-10-CM | POA: Diagnosis not present

## 2023-12-22 DIAGNOSIS — A419 Sepsis, unspecified organism: Secondary | ICD-10-CM | POA: Diagnosis not present

## 2023-12-22 DIAGNOSIS — Z7984 Long term (current) use of oral hypoglycemic drugs: Secondary | ICD-10-CM | POA: Diagnosis not present

## 2023-12-22 DIAGNOSIS — M810 Age-related osteoporosis without current pathological fracture: Secondary | ICD-10-CM | POA: Diagnosis not present

## 2023-12-22 DIAGNOSIS — M51369 Other intervertebral disc degeneration, lumbar region without mention of lumbar back pain or lower extremity pain: Secondary | ICD-10-CM | POA: Diagnosis not present

## 2023-12-22 DIAGNOSIS — I82401 Acute embolism and thrombosis of unspecified deep veins of right lower extremity: Secondary | ICD-10-CM | POA: Diagnosis not present

## 2023-12-27 ENCOUNTER — Ambulatory Visit: Admitting: Pain Medicine

## 2023-12-27 DIAGNOSIS — Z538 Procedure and treatment not carried out for other reasons: Secondary | ICD-10-CM

## 2023-12-27 DIAGNOSIS — M5459 Other low back pain: Secondary | ICD-10-CM | POA: Insufficient documentation

## 2023-12-27 DIAGNOSIS — M545 Low back pain, unspecified: Secondary | ICD-10-CM | POA: Insufficient documentation

## 2023-12-27 MED ORDER — ROPIVACAINE HCL 2 MG/ML IJ SOLN
INTRAMUSCULAR | Status: AC
  Filled 2023-12-27: qty 20

## 2023-12-27 MED ORDER — LIDOCAINE HCL 2 % IJ SOLN
INTRAMUSCULAR | Status: AC
  Filled 2023-12-27: qty 20

## 2023-12-27 MED ORDER — MIDAZOLAM HCL 5 MG/5ML IJ SOLN
INTRAMUSCULAR | Status: AC
Start: 2023-12-27 — End: 2023-12-27
  Filled 2023-12-27: qty 5

## 2023-12-27 MED ORDER — FENTANYL CITRATE (PF) 100 MCG/2ML IJ SOLN
INTRAMUSCULAR | Status: AC
  Filled 2023-12-27: qty 2

## 2023-12-27 MED ORDER — TRIAMCINOLONE ACETONIDE 40 MG/ML IJ SUSP
INTRAMUSCULAR | Status: AC
  Filled 2023-12-27: qty 1

## 2023-12-27 NOTE — Progress Notes (Addendum)
 Department: [facility] at [facility]) Date: 12/27/2023  Event: Canceled/Rescheduled by Provider..  Encounter Type: Procedure:.          Advance notice: N/A  Reason: Sickness.. Recently hospitalized with sepsis and still feeling sick. Note: We'll reschedule later, when feeling better.    Interventional Therapies  Risk  Complexity Considerations:   ANTICOAGULATION: Coumadin  (Stop: 5 days  Re-start: 2hrs)  Hx DVT & PE ALLERGY: Latex Morbid obesity (class III) (BMI>40)  metabolic syndrome  NIDDM  HTN Difficult Lumbar RFA due to morbid obesity    Planned  Pending: Therapeutic right lumbar facet RFA #2 (12/27/2023)    Under consideration:   Therapeutic right lumbar facet RFA #2 (12/27/2023)  Therapeutic left lumbar facet RFA #2  Palliative bilateral lumbar facet MBB [id number]    Completed:   Therapeutic bilateral lumbar facet MBB [id number] (06/21/2023) [facility])  Diagnostic/therapeutic right IA hip inj. x2 (01/28/2022) (100/100/85/85)  Diagnostic/therapeutic left IA hip inj. x1 (01/28/2022) (100/100/85/85)  Diagnostic/therapeutic right trochanteric bursa inj. x1 (09/15/2021) (85/85/85)  Diagnostic/therapeutic left trochanteric bursa inj. x1 (01/28/2022) (100/100/85/85)  Therapeutic left lumbar facet RFA x1 (07/21/2021) (100/100/100 x1 week/80) (Lasted 9 months)  Therapeutic right lumbar facet RFA x1 (07/07/2021) (100/100/80/80) (Lasted 9 months)  Palliative/Therapeutic left L4-5 LESI x1 (11/10/2017) (100/80/0/0-25)  Diagnostic/therapeutic right L4 TFESI x1 (11/19/2021) (90/80/0/0) (fell at [employer]) Palliative right SI joint block x9 (01/28/2022) (100/100/80/80)  Diagnostic left SI joint block x2 (01/28/2022) (100/100/80/80)  Palliative/Therapeutic (Midline) caudal ESI x3 (06/08/2018) (100/100/100 x 2-3 days/0)    Therapeutic  Palliative (PRN) options:   NO PRN procedures without F2F exam by Dr. Alfreida Steffenhagen due to multifactorial  etiology of chronic pain. No more procedures until after 08/14/2022.    Pharmacotherapy  Nonopioids transferred 03/27/2020: Calcium        Disposition: Discharge home  Discharge (Date  Time): 12/27/2023;   hrs.   Primary Care Physician: Sadie Manna, MD Location: [facility] Note by: Eric DELENA Como, MD (TTS technology used. I apologize for any typographical errors that were not detected and corrected.) Date: 12/27/2023; Time: 10:28 AM

## 2023-12-27 NOTE — Patient Instructions (Signed)
 ______________________________________________________________________    Post-Radiofrequency (RF) Discharge Instructions  You have just completed a Radiofrequency Neurotomy.  The following instructions will provide you with information and guidelines for self-care upon discharge.  If at any time you have questions or concerns please call your physician. DO NOT DRIVE YOURSELF!!  Instructions: Apply ice: Fill a plastic sandwich bag with crushed ice. Cover it with a small towel and apply to injection site. Apply for 15 minutes then remove x 15 minutes. Repeat sequence on day of procedure, until you go to bed. The purpose is to minimize swelling and discomfort after procedure. Apply heat: Apply heat to procedure site starting the day following the procedure. The purpose is to treat any soreness and discomfort from the procedure. Food intake: No eating limitations, unless stipulated above.  Nevertheless, if you have had sedation, you may experience some nausea.  In this case, it may be wise to wait at least two hours prior to resuming regular diet. Physical activities: Keep activities to a minimum for the first 8 hours after the procedure. For the first 24 hours after the procedure, do not drive a motor vehicle,  Operate heavy machinery, power tools, or handle any weapons.  Consider walking with the use of an assistive device or accompanied by an adult for the first 24 hours.  Do not drink alcoholic beverages including beer.  Do not make any important decisions or sign any legal documents. Go home and rest today.  Resume activities tomorrow, as tolerated.  Use caution in moving about as you may experience mild leg weakness.  Use caution in cooking, use of household electrical appliances and climbing steps. Driving: If you have received any sedation, you are not allowed to drive for 24 hours after your procedure. Blood thinner: Restart your blood thinner 6 hours after your procedure. (Only for those taking  blood thinners) Insulin : As soon as you can eat, you may resume your normal dosing schedule. (Only for those taking insulin ) Medications: May resume pre-procedure medications.  Do not take any drugs, other than what has been prescribed to you. Infection prevention: Keep procedure site clean and dry. Post-procedure Pain Diary: Extremely important that this be done correctly and accurately. Recorded information will be used to determine the next step in treatment. Pain evaluated is that of treated area only. Do not include pain from an untreated area. Complete every hour, on the hour, for the initial 8 hours. Set an alarm to help you do this part accurately. Do not go to sleep and have it completed later. It will not be accurate. Follow-up appointment: Keep your follow-up appointment after the procedure. Usually 2-6 weeks after radiofrequency. Bring you pain diary. The information collected will be essential for your long-term care.   Expect: From numbing medicine (AKA: Local Anesthetics): Numbness or decrease in pain. Onset: Full effect within 15 minutes of injected. Duration: It will depend on the type of local anesthetic used. On the average, 1 to 8 hours.  From steroids (when added): Decrease in swelling or inflammation. Once inflammation is improved, relief of the pain will follow. Onset of benefits: Depends on the amount of swelling present. The more swelling, the longer it will take for the benefits to be seen. In some cases, up to 10 days. Duration: Steroids will stay in the system x 2 weeks. Duration of benefits will depend on multiple posibilities including persistent irritating factors. From procedure: Some discomfort is to be expected once the numbing medicine wears off. In the case of  radiofrequency procedures, this may last as long as 6-8 weeks. Additional post-procedure pain medication is provided for this. Discomfort is minimized if ice and heat are applied as instructed.  Call  if: You experience numbness and weakness that gets worse with time, as opposed to wearing off. He experience any unusual bleeding, difficulty breathing, or loss of the ability to control your bowel and bladder. (This applies to Spinal procedures only) You experience any redness, swelling, heat, red streaks, elevated temperature, fever, or any other signs of a possible infection.  Emergency Numbers: Durning business hours (Monday - Thursday, 8:00 AM - 4:00 PM) (Friday, 9:00 AM - 12:00 Noon): [phone] After hours: [phone] ______________________________________________________________________     ______________________________________________________________________    Procedure instructions  Stop blood-thinners  Do not eat or drink fluids (other than water) for 6 hours before your procedure  No water for 2 hours before your procedure  Take your blood pressure medicine with a sip of water  Arrive 30 minutes before your appointment  If sedation is planned, bring suitable driver. [place], [place], & public transportation are NOT APPROVED)  Carefully read the Preparing for your procedure detailed instructions  If you have questions call us  at [phone]  Procedure appointments are for procedures only.   NO medication refills or new problem evaluations will be done on procedure days.   Only the scheduled, pre-approved procedure and side will be done.   ______________________________________________________________________      ______________________________________________________________________    Preparing for your procedure  Appointments: If you think you may not be able to keep your appointment, call 24-48 hours in advance to cancel. We need time to make it available to others.  Procedure visits are for procedures only. During your procedure appointment there will be: NO Prescription Refills*. NO medication changes or discussions*. NO discussion of  disability issues*. NO unrelated pain problem evaluations*. NO evaluations to order other pain procedures*. *These will be addressed at a separate and distinct evaluation encounter on the provider's evaluation schedule and not during procedure days.  Instructions: Food intake: Avoid eating anything solid for at least 8 hours prior to your procedure. Clear liquid intake: You may take clear liquids such as water up to 2 hours prior to your procedure. (No carbonated drinks. No soda.) Transportation: Unless otherwise stated by your physician, bring a driver. (Driver cannot be a Market researcher, Pharmacist, community, or any other form of public transportation.) Morning Medicines: Except for blood thinners, take all of your other morning medications with a sip of water. Make sure to take your heart and blood pressure medicines. If your blood pressure's lower number is above 100, the case will be rescheduled. Blood thinners: Make sure to stop your blood thinners as instructed.  If you take a blood thinner, but were not instructed to stop it, call our office [phone] and ask to talk to a nurse. Not stopping a blood thinner prior to certain procedures could lead to serious complications. Diabetics on insulin : Notify the staff so that you can be scheduled 1st case in the morning. If your diabetes requires high dose insulin , take only  of your normal insulin  dose the morning of the procedure and notify the staff that you have done so. Preventing infections: Shower with an antibacterial soap the morning of your procedure.  Build-up your immune system: Take 1000 mg of Vitamin C with every meal (3 times a day) the day prior to your procedure. Antibiotics: Inform the nursing staff if you are taking any  antibiotics or if you have any conditions that may require antibiotics prior to procedures. (Example: recent joint implants)   Pregnancy: If you are pregnant make sure to notify the nursing staff. Not doing so may result in injury to the  fetus, including death.  Sickness: If you have a cold, fever, or any active infections, call and cancel or reschedule your procedure. Receiving steroids while having an infection may result in complications. Arrival: You must be in the facility at least 30 minutes prior to your scheduled procedure. Tardiness: Your scheduled time is also the cutoff time. If you do not arrive at least 15 minutes prior to your procedure, you will be rescheduled.  Children: Do not bring any children with you. Make arrangements to keep them home. Dress appropriately: There is always a possibility that your clothing may get soiled. Avoid long dresses. Valuables: Do not bring any jewelry or valuables.  Reasons to call and reschedule or cancel your procedure: (Following these recommendations will minimize the risk of a serious complication.) Surgeries: Avoid having procedures within 2 weeks of any surgery. (Avoid for 2 weeks before or after any surgery). Flu Shots: Avoid having procedures within 2 weeks of a flu shots or . (Avoid for 2 weeks before or after immunizations). Barium: Avoid having a procedure within 7-10 days after having had a radiological study involving the use of radiological contrast. (Myelograms, Barium swallow or enema study). Heart attacks: Avoid any elective procedures or surgeries for the initial 6 months after a Myocardial Infarction (Heart Attack). Blood thinners: It is imperative that you stop these medications before procedures. Let us  know if you if you take any blood thinner.  Infection: Avoid procedures during or within two weeks of an infection (including chest colds or gastrointestinal problems). Symptoms associated with infections include: Localized redness, fever, chills, night sweats or profuse sweating, burning sensation when voiding, cough, congestion, stuffiness, runny nose, sore throat, diarrhea, nausea, vomiting, cold or Flu symptoms, recent or current infections. It is specially  important if the infection is over the area that we intend to treat. Heart and lung problems: Symptoms that may suggest an active cardiopulmonary problem include: cough, chest pain, breathing difficulties or shortness of breath, dizziness, ankle swelling, uncontrolled high or unusually low blood pressure, and/or palpitations. If you are experiencing any of these symptoms, cancel your procedure and contact your primary care physician for an evaluation.  Remember:  Regular Business hours are:  Monday to Thursday 8:00 AM to 4:00 PM  Provider's Schedule: Eric Como, MD:  Procedure days: Tuesday and Thursday 7:30 AM to 4:00 PM  Wallie Sherry, MD:  Procedure days: Monday and Wednesday 7:30 AM to 4:00 PM Last  Updated: 05/03/2023 ______________________________________________________________________      ______________________________________________________________________    General Risks and Possible Complications  Patient Responsibilities: It is important that you read this as it is part of your informed consent. It is our duty to inform you of the risks and possible complications associated with treatments offered to you. It is your responsibility as a patient to read this and to ask questions about anything that is not clear or that you believe was not covered in this document.  Patient's Rights: You have the right to refuse treatment. You also have the right to change your mind, even after initially having agreed to have the treatment done. However, under this last option, if you wait until the last second to change your mind, you may be charged for the materials used up to that  point.  Introduction: Medicine is not an Visual merchandiser. Everything in Medicine, including the lack of treatment(s), carries the potential for danger, harm, or loss (which is by definition: Risk). In Medicine, a complication is a secondary problem, condition, or disease that can aggravate an already existing  one. All treatments carry the risk of possible complications. The fact that a side effects or complications occurs, does not imply that the treatment was conducted incorrectly. It must be clearly understood that these can happen even when everything is done following the highest safety standards.  No treatment: You can choose not to proceed with the proposed treatment alternative. The "PRO(s)" would include: avoiding the risk of complications associated with the therapy. The "CON(s)" would include: not getting any of the treatment benefits. These benefits fall under one of three categories: diagnostic; therapeutic; and/or palliative. Diagnostic benefits include: getting information which can ultimately lead to improvement of the disease or symptom(s). Therapeutic benefits are those associated with the successful treatment of the disease. Finally, palliative benefits are those related to the decrease of the primary symptoms, without necessarily curing the condition (example: decreasing the pain from a flare-up of a chronic condition, such as incurable terminal cancer).  General Risks and Complications: These are associated to most interventional treatments. They can occur alone, or in combination. They fall under one of the following six (6) categories: no benefit or worsening of symptoms; bleeding; infection; nerve damage; allergic reactions; and/or death. No benefits or worsening of symptoms: In Medicine there are no guarantees, only probabilities. No healthcare provider can ever guarantee that a medical treatment will work, they can only state the probability that it may. Furthermore, there is always the possibility that the condition may worsen, either directly, or indirectly, as a consequence of the treatment. Bleeding: This is more common if the patient is taking a blood thinner, either prescription or over the counter (example: Goody Powders, Fish oil, Aspirin , Garlic, etc.), or if suffering a condition  associated with impaired coagulation (example: Hemophilia, cirrhosis of the liver, low platelet counts, etc.). However, even if you do not have one on these, it can still happen. If you have any of these conditions, or take one of these drugs, make sure to notify your treating physician. Infection: This is more common in patients with a compromised immune system, either due to disease (example: diabetes, cancer, human immunodeficiency virus [HIV], etc.), or due to medications or treatments (example: therapies used to treat cancer and rheumatological diseases). However, even if you do not have one on these, it can still happen. If you have any of these conditions, or take one of these drugs, make sure to notify your treating physician. Nerve Damage: This is more common when the treatment is an invasive one, but it can also happen with the use of medications, such as those used in the treatment of cancer. The damage can occur to small secondary nerves, or to large primary ones, such as those in the spinal cord and brain. This damage may be temporary or permanent and it may lead to impairments that can range from temporary numbness to permanent paralysis and/or brain death. Allergic Reactions: Any time a substance or material comes in contact with our body, there is the possibility of an allergic reaction. These can range from a mild skin rash (contact dermatitis) to a severe systemic reaction (anaphylactic reaction), which can result in death. Death: In general, any medical intervention can result in death, most of the time due to an unforeseen  complication. ______________________________________________________________________      ______________________________________________________________________    Blood Thinners  IMPORTANT NOTICE:  If you take any of these, make sure to notify the nursing staff.  Failure to do so may result in serious injury.  Recommended time intervals to stop and restart  blood-thinners, before & after invasive procedures  Generic Name Brand Name Pre-procedure: Stop medication for this amount of time before your procedure: Post-procedure: Wait this amount of time after the procedure before restarting your medication:  Abciximab Reopro 15 days 2 hrs  Alteplase Activase 10 days 10 days  Anagrelide Agrylin    Apixaban  Eliquis  3 days 6 hrs  Cilostazol Pletal 3 days 5 hrs  Clopidogrel Plavix 7-10 days 2 hrs  Dabigatran Pradaxa 5 days 6 hrs  Dalteparin Fragmin 24 hours 4 hrs  Dipyridamole Aggrenox 11days 2 hrs  Edoxaban Lixiana; Savaysa 3 days 2 hrs  Enoxaparin   Lovenox  24 hours 4 hrs  Eptifibatide Integrillin 8 hours 2 hrs  Fondaparinux  Arixtra 72 hours 12 hrs  Hydroxychloroquine Plaquenil 11 days   Prasugrel Effient 7-10 days 6 hrs  Reteplase Retavase 10 days 10 days  Rivaroxaban  Xarelto  3 days 6 hrs  Ticagrelor Brilinta 5-7 days 6 hrs  Ticlopidine Ticlid 10-14 days 2 hrs  Tinzaparin Innohep 24 hours 4 hrs  Tirofiban Aggrastat 8 hours 2 hrs  Warfarin Coumadin  5 days 2 hrs   Other medications with blood-thinning effects  NOTE: Consider stopping these if you have prolonged bleeding despite not taking any of the above blood thinners. Otherwise ask your provider and this will be decided on a case-by-case basis.  Product indications Generic (Brand) names Note  Cholesterol Lipitor Stop 4 days before procedure  Blood thinner (injectable) Heparin  (LMW or LMWH Heparin ) Stop 24 hours before procedure  Cancer Ibrutinib (Imbruvica) Stop 7 days before procedure  Malaria/Rheumatoid Hydroxychloroquine (Plaquenil) Stop 11 days before procedure  Thrombolytics  10 days before or after procedures   Over-the-counter (OTC) Products with blood-thinning effects  NOTE: Consider stopping these if you have prolonged bleeding despite not taking any of the above blood thinners. Otherwise ask your provider and this will be decided on a case-by-case basis.  Product Common names  Stop Time  Aspirin  > 325 mg Goody Powders, Excedrin, etc. 11 days  Aspirin  <= 81 mg  7 days  Fish oil  4 days  Garlic supplements  7 days  Ginkgo biloba  36 hours  Ginseng  24 hours  NSAIDs Ibuprofen, Naprosyn , etc. 3 days  Vitamin E  4 days   ______________________________________________________________________

## 2023-12-28 ENCOUNTER — Inpatient Hospital Stay: Attending: Oncology

## 2023-12-28 DIAGNOSIS — J9611 Chronic respiratory failure with hypoxia: Secondary | ICD-10-CM | POA: Diagnosis not present

## 2023-12-28 DIAGNOSIS — I509 Heart failure, unspecified: Secondary | ICD-10-CM | POA: Diagnosis not present

## 2024-01-02 DIAGNOSIS — D649 Anemia, unspecified: Secondary | ICD-10-CM | POA: Diagnosis not present

## 2024-01-02 DIAGNOSIS — C3411 Malignant neoplasm of upper lobe, right bronchus or lung: Secondary | ICD-10-CM | POA: Diagnosis not present

## 2024-01-02 DIAGNOSIS — J189 Pneumonia, unspecified organism: Secondary | ICD-10-CM | POA: Diagnosis not present

## 2024-01-02 DIAGNOSIS — J188 Other pneumonia, unspecified organism: Secondary | ICD-10-CM | POA: Diagnosis not present

## 2024-01-02 DIAGNOSIS — Z7901 Long term (current) use of anticoagulants: Secondary | ICD-10-CM | POA: Diagnosis not present

## 2024-01-02 DIAGNOSIS — Z86718 Personal history of other venous thrombosis and embolism: Secondary | ICD-10-CM | POA: Diagnosis not present

## 2024-01-02 DIAGNOSIS — E1165 Type 2 diabetes mellitus with hyperglycemia: Secondary | ICD-10-CM | POA: Diagnosis not present

## 2024-01-02 DIAGNOSIS — I5032 Chronic diastolic (congestive) heart failure: Secondary | ICD-10-CM | POA: Diagnosis not present

## 2024-01-02 DIAGNOSIS — Z09 Encounter for follow-up examination after completed treatment for conditions other than malignant neoplasm: Secondary | ICD-10-CM | POA: Diagnosis not present

## 2024-01-02 DIAGNOSIS — R531 Weakness: Secondary | ICD-10-CM | POA: Diagnosis not present

## 2024-01-02 DIAGNOSIS — G894 Chronic pain syndrome: Secondary | ICD-10-CM | POA: Diagnosis not present

## 2024-01-02 DIAGNOSIS — J9601 Acute respiratory failure with hypoxia: Secondary | ICD-10-CM | POA: Diagnosis not present

## 2024-01-02 DIAGNOSIS — A4189 Other specified sepsis: Secondary | ICD-10-CM | POA: Diagnosis not present

## 2024-01-02 DIAGNOSIS — I110 Hypertensive heart disease with heart failure: Secondary | ICD-10-CM | POA: Diagnosis not present

## 2024-01-02 DIAGNOSIS — I10 Essential (primary) hypertension: Secondary | ICD-10-CM | POA: Diagnosis not present

## 2024-01-02 DIAGNOSIS — E1142 Type 2 diabetes mellitus with diabetic polyneuropathy: Secondary | ICD-10-CM | POA: Diagnosis not present

## 2024-01-02 DIAGNOSIS — Z86711 Personal history of pulmonary embolism: Secondary | ICD-10-CM | POA: Diagnosis not present

## 2024-01-02 DIAGNOSIS — D701 Agranulocytosis secondary to cancer chemotherapy: Secondary | ICD-10-CM | POA: Diagnosis not present

## 2024-01-03 ENCOUNTER — Telehealth: Payer: Self-pay | Admitting: Pain Medicine

## 2024-01-03 NOTE — Telephone Encounter (Signed)
 Kara [name] will check on this and fix it. Patient did show up, she just was cancelled due to being sick.

## 2024-01-03 NOTE — Telephone Encounter (Signed)
 PT called wanted to know why was she marked as a no show on 12/27/23. PT stated that she did come , however was told to R/S because she had been sick. Please give patient a call. TY

## 2024-01-10 ENCOUNTER — Other Ambulatory Visit: Payer: Self-pay

## 2024-01-10 ENCOUNTER — Encounter: Payer: Self-pay | Admitting: Internal Medicine

## 2024-01-10 ENCOUNTER — Ambulatory Visit (INDEPENDENT_AMBULATORY_CARE_PROVIDER_SITE_OTHER): Admitting: Internal Medicine

## 2024-01-10 ENCOUNTER — Telehealth: Payer: Self-pay

## 2024-01-10 VITALS — BP 140/88 | HR 110 | Temp 97.8°F | Resp 16 | Ht 63.0 in | Wt 178.0 lb

## 2024-01-10 DIAGNOSIS — J189 Pneumonia, unspecified organism: Secondary | ICD-10-CM | POA: Diagnosis not present

## 2024-01-10 DIAGNOSIS — R0602 Shortness of breath: Secondary | ICD-10-CM | POA: Diagnosis not present

## 2024-01-10 DIAGNOSIS — J9611 Chronic respiratory failure with hypoxia: Secondary | ICD-10-CM

## 2024-01-10 DIAGNOSIS — Z9981 Dependence on supplemental oxygen: Secondary | ICD-10-CM | POA: Diagnosis not present

## 2024-01-10 DIAGNOSIS — J4489 Other specified chronic obstructive pulmonary disease: Secondary | ICD-10-CM | POA: Diagnosis not present

## 2024-01-10 MED ORDER — IPRATROPIUM-ALBUTEROL 20-100 MCG/ACT IN AERS: 1.0000 | INHALATION_SPRAY | Freq: Four times a day (QID) | RESPIRATORY_TRACT | 4 refills | Status: DC

## 2024-01-10 MED ORDER — BREZTRI AEROSPHERE 160-9-4.8 MCG/ACT IN AERO: 2.0000 | INHALATION_SPRAY | Freq: Two times a day (BID) | RESPIRATORY_TRACT | 11 refills | Status: DC

## 2024-01-10 MED ORDER — ALBUTEROL SULFATE HFA 108 (90 BASE) MCG/ACT IN AERS: 2.0000 | INHALATION_SPRAY | Freq: Four times a day (QID) | RESPIRATORY_TRACT | 3 refills | Status: DC | PRN

## 2024-01-10 MED ORDER — IPRATROPIUM-ALBUTEROL 20-100 MCG/ACT IN AERS
1.0000 | INHALATION_SPRAY | Freq: Four times a day (QID) | RESPIRATORY_TRACT | 4 refills | Status: DC
Start: 2024-01-10 — End: 2024-01-10

## 2024-01-10 MED ORDER — ALBUTEROL SULFATE HFA 108 (90 BASE) MCG/ACT IN AERS: 2.0000 | INHALATION_SPRAY | Freq: Four times a day (QID) | RESPIRATORY_TRACT | 3 refills | Status: AC | PRN

## 2024-01-10 MED ORDER — BUDESONIDE-FORMOTEROL FUMARATE 80-4.5 MCG/ACT IN AERO: 2.0000 | INHALATION_SPRAY | Freq: Two times a day (BID) | RESPIRATORY_TRACT | 3 refills | Status: DC

## 2024-01-10 NOTE — Addendum Note (Signed)
 Addended by: Gracious Renken on: 01/10/2024 03:24 PM   Modules accepted: Orders

## 2024-01-10 NOTE — Telephone Encounter (Signed)
 Pt called and spoke with kaley that both inhaler is expensive as per dr Elfreda bathe change  Symbicort  and albuterol 

## 2024-01-10 NOTE — Addendum Note (Signed)
 Addended by: Juli Odom on: 01/10/2024 03:51 PM   Modules accepted: Orders

## 2024-01-10 NOTE — Progress Notes (Signed)
 [facility] [address] [place], [place] [address]  Pulmonary Sleep Medicine   Office Visit Note  Patient Name: Kara [name] DOB: May 14, 1951 MRN [id number]  Date of Service: 01/10/2024  Complaints/HPI: She was in the hospital and recently discharged with a diagnosis of respiratory failoure and sepsis. She was discharged with oxygen as a concentrator. She does not have portable. She feels short of breath. And she will need to qualify for the O2. Patient has no chest pain noted no fevers or chills. She states she had a CXR which still shows pneumonia. This was a few days ago. Patient has no fever as noted. She is short of breath. She was also given abx by her pcp      Office Spirometry Results:     ROS  General: (-) fever, (-) chills, (-) night sweats, (-) weakness Skin: (-) rashes, (-) itching,. Eyes: (-) visual changes, (-) redness, (-) itching. Nose and Sinuses: (-) nasal stuffiness or itchiness, (-) postnasal drip, (-) nosebleeds, (-) sinus trouble. Mouth and Throat: (-) sore throat, (-) hoarseness. Neck: (-) swollen glands, (-) enlarged thyroid , (-) neck pain. Respiratory: - cough, (-) bloody sputum, + shortness of breath, - wheezing. Cardiovascular: - ankle swelling, (-) chest pain. Lymphatic: (-) lymph node enlargement. Neurologic: (-) numbness, (-) tingling. Psychiatric: (-) anxiety, (-) depression   Current Medication: Outpatient Encounter Medications as of 01/10/2024  Medication Sig Note   bisacodyl  (DULCOLAX) 10 MG suppository Place 1 suppository (10 mg total) rectally as needed for moderate constipation. 08/02/2023: prn   candesartan (ATACAND) 16 MG tablet Take 16 mg by mouth in the morning.    cholecalciferol  (VITAMIN D ) 25 MCG (1000 UNIT) tablet Take 1,000 Units by mouth in the morning.    Cyanocobalamin  (VITAMIN B-12) 5000 MCG TBDP Take 5,000 mcg by mouth in the morning.    cyclobenzaprine  (FLEXERIL ) 10 MG tablet Take 10 mg by mouth at  bedtime.    Dulaglutide (TRULICITY) 1.5 MG/0.5ML SOAJ Inject 1.5 mg into the skin once a week. Saturday 08/08/2023: Pt takes on saturday   gabapentin  (NEURONTIN ) 100 MG capsule Take 200 mg by mouth with breakfast, with lunch, and with evening meal.    gabapentin  (NEURONTIN ) 300 MG capsule Take 300 mg by mouth at bedtime.    glipiZIDE  (GLUCOTROL ) 10 MG tablet Take 10 mg by mouth daily before breakfast.    Magnesium  Oxide -Mg Supplement 500 MG TABS Take 1 tablet by mouth at bedtime.    omeprazole  (PRILOSEC) 20 MG capsule Take 1 capsule (20 mg total) by mouth daily.    ondansetron  (ZOFRAN ) 8 MG tablet TAKE 1 TABLET BY MOUTH EVERY 8 HOURS AS NEEDED FOR NAUSEA OR VOMITING. START ON THE 3RD DAY AFTER CHEMOTHERAPY    ONETOUCH ULTRA TEST test strip     osimertinib  mesylate (TAGRISSO ) 80 MG tablet Take 1 tablet (80 mg total) by mouth daily.    oxybutynin  (DITROPAN ) 5 MG tablet Take 1 tablet by mouth 2 (two) times daily.    prochlorperazine  (COMPAZINE ) 10 MG tablet Take 1 tablet (10 mg total) by mouth every 6 (six) hours as needed for nausea or vomiting.    rOPINIRole  (REQUIP ) 1 MG tablet Take 1 mg by mouth at bedtime.    sucralfate  (CARAFATE ) 1 g tablet Take 1 tablet (1 g total) by mouth 3 (three) times daily with meals. Dissolve tablet in 4 tablesppons warm water swish and swallow 30 min before meals. (Patient taking differently: Take 1 g by mouth 3 (three) times  daily with meals. Dissolve tablet in 4 tablespoons warm water swish and swallow 30 min before meals.)    warfarin (COUMADIN ) 5 MG tablet Take 5 mg by mouth daily. 12/08/2023: Pt confirmed now taking 5mg  every morning   No facility-administered encounter medications on file as of 01/10/2024.    Surgical History: Past Surgical History:  Procedure Laterality Date   APPENDECTOMY N/A 02/24/2017   Procedure: Incidental  APPENDECTOMY;  Surgeon: Shelva Dunnings, MD;  Location: [name];  Service: General;  Laterality: N/A;   CARPAL TUNNEL RELEASE  Bilateral    CATARACT EXTRACTION W/PHACO Left 11/21/2018   Procedure: CATARACT EXTRACTION PHACO AND INTRAOCULAR LENS PLACEMENT (IOC) LEFT DIABETES;  Surgeon: Jaye Fallow, MD;  [facility];  Service: Ophthalmology;  Laterality: Left;  latex sensitivity Diabetic - oral meds   CATARACT EXTRACTION W/PHACO Right 12/12/2018   Procedure: CATARACT EXTRACTION PHACO AND INTRAOCULAR LENS PLACEMENT (IOC) RIGHT DIABETES;  Surgeon: Jaye Fallow, MD;  Location: [facility];  Service: Ophthalmology;  Laterality: Right;  Diabetes-oral med Latex sensitiviy   COLON RESECTION SIGMOID N/A 11/02/2016   Procedure: COLON RESECTION SIGMOID;  Surgeon: Shelva Dunnings, MD;  Location: [name];  Service: General;  Laterality: N/A;   COLON SURGERY  11/02/2016   COLONOSCOPY WITH PROPOFOL  N/A 02/03/2017   Procedure: COLONOSCOPY WITH PROPOFOL ;  Surgeon: Unk Corinn Skiff, MD;  Location: [name];  Service: Gastroenterology;  Laterality: N/A;   COLOSTOMY N/A 11/02/2016   Procedure: COLOSTOMY;  Surgeon: Shelva Dunnings, MD;  Location: [name];  Service: General;  Laterality: N/A;   COLOSTOMY REVERSAL N/A 02/24/2017   Procedure: COLOSTOMY REVERSAL, ostomy takedown, spleenic flexure mobilization, excision rectal stump/distal sigmoid, anastomosis with suture reinforcement;  Surgeon: Shelva Dunnings, MD;  Location: [name];  Service: General;  Laterality: N/A;   ENDOBRONCHIAL ULTRASOUND N/A 05/16/2023   Procedure: ENDOBRONCHIAL ULTRASOUND;  Surgeon: Tamea Dedra CROME, MD;  Location: [name];  Service: Cardiopulmonary;  Laterality: N/A;   FLEXIBLE BRONCHOSCOPY N/A 05/16/2023   Procedure: FLEXIBLE BRONCHOSCOPY;  Surgeon: Tamea Dedra CROME, MD;  Location: [name];  Service: Cardiopulmonary;  Laterality: N/A;   ILEO LOOP DIVERSION N/A 02/24/2017   Procedure: ILEO LOOP COLOSTOMY;  Surgeon: Shelva Dunnings, MD;  Location: [name];  Service: General;  Laterality: N/A;   ILEOSTOMY  CLOSURE N/A 06/01/2017   Procedure: LOOP ILEOSTOMY TAKEDOWN;  Surgeon: Shelva Dunnings, MD;  Location: [name];  Service: General;  Laterality: N/A;   IR IMAGING GUIDED PORT INSERTION  06/14/2023   IR REMOVAL TUN ACCESS W/ PORT W/O FL MOD SED  11/16/2023   IVC FILTER INSERTION Right 10/2016   IVC FILTER REMOVAL N/A 08/09/2017   Procedure: IVC FILTER REMOVAL;  Surgeon: Jama Cordella MATSU, MD;  Location: [name];  Service: Cardiovascular;  Laterality: N/A;   KNEE SURGERY Right    torn menicus   LAPAROSCOPY N/A 02/24/2017   Procedure: LAPAROSCOPY DIAGNOSTIC;  Surgeon: Shelva Dunnings, MD;  Location: [name];  Service: General;  Laterality: N/A;   LYSIS OF ADHESION N/A 02/24/2017   Procedure: LYSIS OF ADHESION;  Surgeon: Shelva Dunnings, MD;  Location: [name];  Service: General;  Laterality: N/A;   PULMONARY VENOGRAPHY N/A 11/19/2016   Procedure: Pulmonary Venography; IVC filter placement; possible pulmonary thrombectomy;  Surgeon: Jama Cordella MATSU, MD;  Location: [name];  Service: Cardiovascular;  Laterality: N/A;   SACROPLASTY N/A 11/08/2017   Procedure: SACROPLASTY S2;  Surgeon: Kathlynn Sharper, MD;  Location: [name];  Service: Orthopedics;  Laterality: N/A;  SIGMOIDOSCOPY N/A 11/13/2016   Procedure: endoscopic  flexible SIGMOIDOSCOPY;  Surgeon: Wonda Charlie BRAVO, MD;  Location: [name];  Service: General;  Laterality: N/A;   SIGMOIDOSCOPY N/A 05/19/2017   Procedure: ENID MORIN;  Surgeon: Shelva Dunnings, MD;  Location: [name];  Service: General;  Laterality: N/A;   VIDEO BRONCHOSCOPY WITH ENDOBRONCHIAL NAVIGATION N/A 02/23/2021   Procedure: ROBOTIC VIDEO BRONCHOSCOPY WITH ENDOBRONCHIAL NAVIGATION;  Surgeon: Tamea Dedra CROME, MD;  Location: [name];  Service: Pulmonary;  Laterality: N/A;    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, last injection was in Aug  2018   CHF (congestive heart failure) (HCC)    followed colon resection, wouldn't let me get up   Chronic anticoagulation    Degenerative disc disease, lumbar    Diverticulitis 2018   Diverticulosis    DVT of lower extremity, bilateral (HCC) 2018   Essential hypertension    History of being hospitalized    1 month ago for 3 days, vomiting, and kink in upper intestine   History of hiatal hernia    Mediastinal adenopathy 04/2023   MRSA nasal colonization    Non-small cell lung cancer (HCC) 2022   right   Obesity (BMI 30-39.9)    Osteoarthritis    Osteoporosis    PE (pulmonary thromboembolism) (HCC) 2018   Pneumonia    Small bowel obstruction (HCC) 03/20/2023   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HCC)    Type 2 diabetes mellitus with obesity (HCC)    Vertigo     Family History: Family History  Problem Relation Age of Onset   Diabetes Mother    Heart disease Mother    Cancer Mother    Breast cancer Mother        >50   Heart disease Father    Diabetes Sister    Heart disease Sister     Social History: Social History   Socioeconomic History   Marital status: Married    Spouse name: Butler   Number of children: 1   Years of education: Not on file   Highest education level: Not on file  Occupational History   Not on file  Tobacco Use   Smoking status: Never    Passive exposure: Current (husband smokes)   Smokeless tobacco: Never  Vaping Use   Vaping status: Never Used  Substance and Sexual Activity   Alcohol  use: No    Alcohol /week: 0.0 standard drinks of alcohol    Drug use: No   Sexual activity: Never  Other Topics Concern   Not on file  Social History Narrative   Not on file   Social Drivers of Health   Financial Resource Strain: Low Risk  (05/19/2023)   Received from [facility]   Overall Financial Resource Strain (CARDIA)    Difficulty of Paying Living Expenses: Not hard at all  Food Insecurity: No Food Insecurity (12/12/2023)    Hunger Vital Sign    Worried About Running Out of Food in the Last Year: Never true    Ran Out of Food in the Last Year: Never true  Transportation Needs: No Transportation Needs (12/12/2023)   PRAPARE - Administrator, Civil Service (Medical): No    Lack of Transportation (Non-Medical): No  Physical Activity: Not on file  Stress: Not on file  Social Connections: Moderately Isolated (12/12/2023)   Social Connection and Isolation Panel  Frequency of Communication with Friends and Family: More than three times a week    Frequency of Social Gatherings with Friends and Family: Once a week    Attends Religious Services: Never    Database administrator or Organizations: No    Attends Banker Meetings: Never    Marital Status: Married  Catering manager Violence: Not At Risk (12/12/2023)   Humiliation, Afraid, Rape, and Kick questionnaire    Fear of Current or Ex-Partner: No    Emotionally Abused: No    Physically Abused: No    Sexually Abused: No    Vital Signs: Blood pressure (!) 140/88, pulse (!) 110, temperature 97.8 F (36.6 C), resp. rate 16, height 5' 3 (1.6 m), weight 178 lb (80.7 kg), SpO2 91%.  Examination: General Appearance: The patient is well-developed, well-nourished, and in no distress. Skin: Gross inspection of skin unremarkable. Head: normocephalic, no gross deformities. Eyes: no gross deformities noted. ENT: ears appear grossly normal no exudates. Neck: Supple. No thyromegaly. No LAD. Respiratory: few rhonchi noted. Cardiovascular: Normal S1 and S2 without murmur or rub. Extremities: No cyanosis. pulses are equal. Neurologic: Alert and oriented. No involuntary movements.  LABS: Recent Results (from the past 2160 hours)  CMP (Cancer Center only)     Status: Abnormal   Collection Time: 11/01/23  2:40 PM  Result Value Ref Range   Sodium 137 135 - 145 mmol/L   Potassium 3.2 (L) 3.5 - 5.1 mmol/L   Chloride 107 98 - 111 mmol/L   CO2 23 22  - 32 mmol/L   Glucose, Bld 160 (H) 70 - 99 mg/dL    Comment: Glucose reference range applies only to samples taken after fasting for at least 8 hours.   BUN 12 8 - 23 mg/dL   Creatinine 9.16 9.55 - 1.00 mg/dL   Calcium  8.6 (L) 8.9 - 10.3 mg/dL   Total Protein 6.8 6.5 - 8.1 g/dL   Albumin 3.6 3.5 - 5.0 g/dL   AST 28 15 - 41 U/L   ALT 22 0 - 44 U/L   Alkaline Phosphatase 49 38 - 126 U/L   Total Bilirubin 0.5 0.0 - 1.2 mg/dL   GFR, Estimated >39 >39 mL/min    Comment: (NOTE) Calculated using the CKD-EPI Creatinine Equation (2021)    Anion gap 7 5 - 15    Comment: Performed at [facility], [address] [place], [place] [address]  CBC with Differential (Cancer Center Only)     Status: Abnormal   Collection Time: 11/01/23  2:40 PM  Result Value Ref Range   WBC Count 3.2 (L) 4.0 - 10.5 K/uL   RBC 3.91 3.87 - 5.11 MIL/uL   Hemoglobin 12.1 12.0 - 15.0 g/dL   HCT 61.5 63.9 - 53.9 %   MCV 98.2 80.0 - 100.0 fL   MCH 30.9 26.0 - 34.0 pg   MCHC 31.5 30.0 - 36.0 g/dL   RDW 87.2 88.4 - 84.4 %   Platelet Count 78 (L) 150 - 400 K/uL   nRBC 0.0 0.0 - 0.2 %   Neutrophils Relative % 75 %   Neutro Abs 2.4 1.7 - 7.7 K/uL   Lymphocytes Relative 16 %   Lymphs Abs 0.5 (L) 0.7 - 4.0 K/uL   Monocytes Relative 8 %   Monocytes Absolute 0.3 0.1 - 1.0 K/uL   Eosinophils Relative 1 %   Eosinophils Absolute 0.0 0.0 - 0.5 K/uL   Basophils Relative 0 %   Basophils Absolute  0.0 0.0 - 0.1 K/uL   Immature Granulocytes 0 %   Abs Immature Granulocytes 0.01 0.00 - 0.07 K/uL    Comment: Performed at [facility], [address] [place], [place] [address]  ECHOCARDIOGRAM COMPLETE     Status: None   Collection Time: 11/17/23 10:50 AM  Result Value Ref Range   Ao pk vel 1.20 m/s   AV Area VTI 2.57 cm2   AR max vel 2.37 cm2   AV Mean grad 3.0 mmHg   AV Peak grad 5.8 mmHg   S' Lateral 2.60 cm   AV Area mean vel 2.38 cm2   Area-P 1/2 4.46 cm2   MV VTI 2.22 cm2   Est EF 55 - 60%   CBC with  Differential (Cancer Center Only)     Status: Abnormal   Collection Time: 11/29/23  2:39 PM  Result Value Ref Range   WBC Count 3.8 (L) 4.0 - 10.5 K/uL   RBC 4.09 3.87 - 5.11 MIL/uL   Hemoglobin 12.2 12.0 - 15.0 g/dL   HCT 61.8 63.9 - 53.9 %   MCV 93.2 80.0 - 100.0 fL   MCH 29.8 26.0 - 34.0 pg   MCHC 32.0 30.0 - 36.0 g/dL   RDW 86.9 88.4 - 84.4 %   Platelet Count 90 (L) 150 - 400 K/uL   nRBC 0.0 0.0 - 0.2 %   Neutrophils Relative % 69 %   Neutro Abs 2.7 1.7 - 7.7 K/uL   Lymphocytes Relative 17 %   Lymphs Abs 0.6 (L) 0.7 - 4.0 K/uL   Monocytes Relative 12 %   Monocytes Absolute 0.5 0.1 - 1.0 K/uL   Eosinophils Relative 1 %   Eosinophils Absolute 0.0 0.0 - 0.5 K/uL   Basophils Relative 1 %   Basophils Absolute 0.0 0.0 - 0.1 K/uL   Immature Granulocytes 0 %   Abs Immature Granulocytes 0.01 0.00 - 0.07 K/uL    Comment: Performed at [facility], [address] [place], [place] [address]  CMP (Cancer Center only)     Status: Abnormal   Collection Time: 11/29/23  2:39 PM  Result Value Ref Range   Sodium 136 135 - 145 mmol/L   Potassium 4.0 3.5 - 5.1 mmol/L   Chloride 108 98 - 111 mmol/L   CO2 22 22 - 32 mmol/L   Glucose, Bld 187 (H) 70 - 99 mg/dL    Comment: Glucose reference range applies only to samples taken after fasting for at least 8 hours.   BUN 14 8 - 23 mg/dL   Creatinine 9.00 9.55 - 1.00 mg/dL   Calcium  8.9 8.9 - 10.3 mg/dL   Total Protein 7.1 6.5 - 8.1 g/dL   Albumin 3.3 (L) 3.5 - 5.0 g/dL   AST 25 15 - 41 U/L   ALT 17 0 - 44 U/L   Alkaline Phosphatase 51 38 - 126 U/L   Total Bilirubin 0.4 0.0 - 1.2 mg/dL   GFR, Estimated >39 >39 mL/min    Comment: (NOTE) Calculated using the CKD-EPI Creatinine Equation (2021)    Anion gap 6 5 - 15    Comment: Performed at [facility], [address] [place], [place] [address]  Lipase, blood     Status: None   Collection Time: 12/07/23  6:58 PM  Result Value Ref Range   Lipase 26 11 - 51 U/L    Comment:  Performed at [facility], [address], [place], [place] [address]  Comprehensive metabolic panel     Status: Abnormal   Collection Time: 12/07/23  6:58 PM  Result Value Ref Range   Sodium 140 135 - 145 mmol/L   Potassium 4.0 3.5 - 5.1 mmol/L   Chloride 108 98 - 111 mmol/L   CO2 21 (L) 22 - 32 mmol/L   Glucose, Bld 182 (H) 70 - 99 mg/dL    Comment: Glucose reference range applies only to samples taken after fasting for at least 8 hours.   BUN 13 8 - 23 mg/dL   Creatinine, Ser 8.86 (H) 0.44 - 1.00 mg/dL   Calcium  9.2 8.9 - 10.3 mg/dL   Total Protein 7.7 6.5 - 8.1 g/dL   Albumin 3.4 (L) 3.5 - 5.0 g/dL   AST 31 15 - 41 U/L   ALT 18 0 - 44 U/L   Alkaline Phosphatase 61 38 - 126 U/L   Total Bilirubin 0.9 0.0 - 1.2 mg/dL   GFR, Estimated 52 (L) >60 mL/min    Comment: (NOTE) Calculated using the CKD-EPI Creatinine Equation (2021)    Anion gap 11 5 - 15    Comment: Performed at [facility], [address] [place], [place] [address]  CBC     Status: Abnormal   Collection Time: 12/07/23  6:58 PM  Result Value Ref Range   WBC 2.1 (L) 4.0 - 10.5 K/uL   RBC 4.35 3.87 - 5.11 MIL/uL   Hemoglobin 13.2 12.0 - 15.0 g/dL   HCT 59.2 63.9 - 53.9 %   MCV 93.6 80.0 - 100.0 fL   MCH 30.3 26.0 - 34.0 pg   MCHC 32.4 30.0 - 36.0 g/dL   RDW 86.7 88.4 - 84.4 %   Platelets 83 (L) 150 - 400 K/uL    Comment: Immature Platelet Fraction may be clinically indicated, consider ordering this additional test [id number]    nRBC 0.0 0.0 - 0.2 %    Comment: Performed at [facility], [address] [place], [place] [address]  Troponin I (High Sensitivity)     Status: None   Collection Time: 12/07/23  6:58 PM  Result Value Ref Range   Troponin I (High Sensitivity) 15 <18 ng/L    Comment: (NOTE) Elevated high sensitivity troponin I (hsTnI) values and significant  changes across serial measurements may suggest ACS but many other  chronic and acute conditions are known to  elevate hsTnI results.  Refer to the Links section for chest pain algorithms and additional  guidance. Performed at [facility], [address] [place], [place] [address]   Blood culture (routine x 2)     Status: None   Collection Time: 12/07/23 10:41 PM   Specimen: BLOOD  Result Value Ref Range   Specimen Description BLOOD BLOOD RIGHT ARM    Special Requests      BOTTLES DRAWN AEROBIC AND ANAEROBIC Blood Culture adequate volume   Culture      NO GROWTH 5 DAYS Performed at [facility], [address] [place], [place] [address]    Report Status 12/12/2023 FINAL   Blood culture (routine x 2)     Status: None   Collection Time: 12/07/23 10:41 PM   Specimen: BLOOD  Result Value Ref Range   Specimen Description BLOOD BLOOD LEFT FOREARM    Special Requests      BOTTLES DRAWN AEROBIC AND ANAEROBIC Blood Culture results may not be optimal due to an excessive volume of blood received in culture bottles   Culture  NO GROWTH 5 DAYS Performed at [facility], [address] [place], [place] [address]    Report Status 12/12/2023 FINAL   Lactic acid, plasma     Status: Abnormal   Collection Time: 12/07/23 10:41 PM  Result Value Ref Range   Lactic Acid, Venous 2.1 (HH) 0.5 - 1.9 mmol/L    Comment: CRITICAL RESULT CALLED TO, READ BACK BY AND VERIFIED WITH carlotta Code @2323  on 12/07/23 skl Performed at [facility], [address] [place], [place] [address]   Lactic acid, plasma     Status: None   Collection Time: 12/08/23  1:24 AM  Result Value Ref Range   Lactic Acid, Venous 1.8 0.5 - 1.9 mmol/L    Comment: Performed at [facility], [address] [place], [place] [address]  Urinalysis, Routine w reflex microscopic -Urine, Clean Catch     Status: Abnormal   Collection Time: 12/08/23  1:32 AM  Result Value Ref Range   Color, Urine YELLOW (A) YELLOW   APPearance HAZY (A) CLEAR   Specific Gravity, Urine 1.011 1.005 -  1.030   pH 5.0 5.0 - 8.0   Glucose, UA NEGATIVE NEGATIVE mg/dL   Hgb urine dipstick MODERATE (A) NEGATIVE   Bilirubin Urine NEGATIVE NEGATIVE   Ketones, ur 5 (A) NEGATIVE mg/dL   Protein, ur 30 (A) NEGATIVE mg/dL   Nitrite NEGATIVE NEGATIVE   Leukocytes,Ua SMALL (A) NEGATIVE   RBC / HPF 0-5 0 - 5 RBC/hpf   WBC, UA >50 0 - 5 WBC/hpf   Bacteria, UA MANY (A) NONE SEEN   Squamous Epithelial / HPF 0-5 0 - 5 /HPF   Mucus PRESENT     Comment: Performed at [facility], [address] [place], [place] [address]  Urine Culture (for pregnant, neutropenic or urologic patients or patients with an indwelling urinary catheter)     Status: Abnormal   Collection Time: 12/08/23  1:32 AM   Specimen: Urine, Clean Catch  Result Value Ref Range   Specimen Description      URINE, CLEAN CATCH Performed at [facility], [address] [place], [place] [address]    Special Requests      NONE Performed at [facility], [address], [place], [place] [address]    Culture 50,000 COLONIES/mL KLEBSIELLA PNEUMONIAE (A)    Report Status 12/10/2023 FINAL    Organism ID, Bacteria KLEBSIELLA PNEUMONIAE (A)       Susceptibility   Klebsiella pneumoniae - MIC*    AMPICILLIN RESISTANT Resistant     CEFAZOLIN  <=4 SENSITIVE Sensitive     CEFEPIME  <=0.12 SENSITIVE Sensitive     CEFTRIAXONE  <=0.25 SENSITIVE Sensitive     CIPROFLOXACIN  <=0.25 SENSITIVE Sensitive     GENTAMICIN <=1 SENSITIVE Sensitive     IMIPENEM <=0.25 SENSITIVE Sensitive     NITROFURANTOIN <=16 SENSITIVE Sensitive     TRIMETH/SULFA <=20 SENSITIVE Sensitive     AMPICILLIN/SULBACTAM <=2 SENSITIVE Sensitive     PIP/TAZO <=4 SENSITIVE Sensitive ug/mL    * 50,000 COLONIES/mL KLEBSIELLA PNEUMONIAE  Legionella Pneumophila Serogp 1 Ur Ag     Status: None   Collection Time: 12/08/23  1:32 AM  Result Value Ref Range   L. pneumophila Serogp 1 Ur Ag Negative Negative    Comment: (NOTE) Presumptive negative for L.  pneumophila serogroup 1 antigen in urine, suggesting no recent or current infection. Legionnaires' disease cannot be ruled out since other serogroups and species may also cause disease. Performed At:  [facility] [address] [place], [place] [id number] Jennette Shorter MD Ey:[id number]    Source of Sample URINE, RANDOM     Comment: Performed at [facility], [address] [place], [place] [address]  Strep pneumoniae urinary antigen     Status: None   Collection Time: 12/08/23  1:32 AM  Result Value Ref Range   Strep Pneumo Urinary Antigen NEGATIVE NEGATIVE    Comment:        Infection due to S. pneumoniae cannot be absolutely ruled out since the antigen present may be below the detection limit of the test. Performed at [facility], [address] [place], [place] [address]   Hemoglobin A1c     Status: Abnormal   Collection Time: 12/08/23  5:27 AM  Result Value Ref Range   Hgb A1c MFr Bld 7.2 (H) 4.8 - 5.6 %    Comment: (NOTE) Diagnosis of Diabetes The following HbA1c ranges recommended by the American Diabetes Association (ADA) may be used as an aid in the diagnosis of diabetes mellitus.  Hemoglobin             Suggested A1C NGSP%              Diagnosis  <5.7                   Non Diabetic  5.7-6.4                Pre-Diabetic  >6.4                   Diabetic  <7.0                   Glycemic control for                       adults with diabetes.     Mean Plasma Glucose 159.94 mg/dL    Comment: Performed at [facility], [address] [place], [place] [address]  Protime-INR     Status: Abnormal   Collection Time: 12/08/23  5:27 AM  Result Value Ref Range   Prothrombin  Time 26.4 (H) 11.4 - 15.2 seconds   INR 2.3 (H) 0.8 - 1.2    Comment: (NOTE) INR goal varies based on device and disease states. Performed at [facility], [address] [place], [place] [address]   Cortisol-am, blood     Status: None   Collection  Time: 12/08/23  5:27 AM  Result Value Ref Range   Cortisol - AM 19.2 6.7 - 22.6 ug/dL    Comment: Performed at [facility], [address] [place], [place] [address]  CBG monitoring, ED     Status: Abnormal   Collection Time: 12/08/23  7:16 AM  Result Value Ref Range   Glucose-Capillary 136 (H) 70 - 99 mg/dL    Comment: Glucose reference range applies only to samples taken after fasting for at least 8 hours.  SARS Coronavirus 2 by RT PCR (hospital order, performed in [facility] hospital lab) *cepheid single result test* Anterior Nasal Swab     Status: None   Collection Time: 12/08/23 10:26 AM   Specimen: Anterior Nasal Swab  Result Value Ref Range   SARS Coronavirus 2 by RT PCR NEGATIVE NEGATIVE    Comment: (NOTE) SARS-CoV-2 target nucleic acids are NOT DETECTED.  The SARS-CoV-2 RNA is generally detectable in upper and lower respiratory specimens during the acute  phase of infection. The lowest concentration of SARS-CoV-2 viral copies this assay can detect is 250 copies / mL. A negative result does not preclude SARS-CoV-2 infection and should not be used as the sole basis for treatment or other patient management decisions.  A negative result may occur with improper specimen collection / handling, submission of specimen other than nasopharyngeal swab, presence of viral mutation(s) within the areas targeted by this assay, and inadequate number of viral copies (<250 copies / mL). A negative result must be combined with clinical observations, patient history, and epidemiological information.  Fact Sheet for Patients:   RoadLapTop.co.za  Fact Sheet for Healthcare Providers: http://kim-miller.com/  This test is not yet approved or  cleared by the [place]  FDA and has been authorized for detection and/or diagnosis of SARS-CoV-2 by FDA under an Emergency Use Authorization (EUA).  This EUA will remain in effect (meaning this test  can be used) for the duration of the COVID-19 declaration under Section 564(b)(1) of the Act, 21 U.S.C. section 360bbb-3(b)(1), unless the authorization is terminated or revoked sooner.  Performed at [facility], [address] [place], [place] [address]   Respiratory (~20 pathogens) panel by PCR     Status: None   Collection Time: 12/08/23 11:23 AM   Specimen: Nasopharyngeal Swab; Respiratory  Result Value Ref Range   Adenovirus NOT DETECTED NOT DETECTED   Coronavirus 229E NOT DETECTED NOT DETECTED    Comment: (NOTE) The Coronavirus on the Respiratory Panel, DOES NOT test for the novel  Coronavirus (2019 nCoV)    Coronavirus HKU1 NOT DETECTED NOT DETECTED   Coronavirus NL63 NOT DETECTED NOT DETECTED   Coronavirus OC43 NOT DETECTED NOT DETECTED   Metapneumovirus NOT DETECTED NOT DETECTED   Rhinovirus / Enterovirus NOT DETECTED NOT DETECTED   Influenza A NOT DETECTED NOT DETECTED   Influenza B NOT DETECTED NOT DETECTED   Parainfluenza Virus 1 NOT DETECTED NOT DETECTED   Parainfluenza Virus 2 NOT DETECTED NOT DETECTED   Parainfluenza Virus 3 NOT DETECTED NOT DETECTED   Parainfluenza Virus 4 NOT DETECTED NOT DETECTED   Respiratory Syncytial Virus NOT DETECTED NOT DETECTED   Bordetella pertussis NOT DETECTED NOT DETECTED   Bordetella Parapertussis NOT DETECTED NOT DETECTED   Chlamydophila pneumoniae NOT DETECTED NOT DETECTED   Mycoplasma pneumoniae NOT DETECTED NOT DETECTED    Comment: Performed at [facility], [address] [place], [place] [address]  CBG monitoring, ED     Status: Abnormal   Collection Time: 12/08/23 12:15 PM  Result Value Ref Range   Glucose-Capillary 192 (H) 70 - 99 mg/dL    Comment: Glucose reference range applies only to samples taken after fasting for at least 8 hours.  Glucose, capillary     Status: Abnormal   Collection Time: 12/08/23  4:56 PM  Result Value Ref Range   Glucose-Capillary 106 (H) 70 - 99 mg/dL    Comment: Glucose  reference range applies only to samples taken after fasting for at least 8 hours.  Glucose, capillary     Status: Abnormal   Collection Time: 12/08/23  8:18 PM  Result Value Ref Range   Glucose-Capillary 117 (H) 70 - 99 mg/dL    Comment: Glucose reference range applies only to samples taken after fasting for at least 8 hours.  Protime-INR     Status: Abnormal   Collection Time: 12/09/23  5:13 AM  Result Value Ref Range   Prothrombin  Time 26.8 (H) 11.4 - 15.2 seconds  INR 2.3 (H) 0.8 - 1.2    Comment: (NOTE) INR goal varies based on device and disease states. Performed at [facility], [address] [place], [place] [address]   Basic metabolic panel with GFR     Status: Abnormal   Collection Time: 12/09/23  5:13 AM  Result Value Ref Range   Sodium 141 135 - 145 mmol/L   Potassium 4.1 3.5 - 5.1 mmol/L   Chloride 116 (H) 98 - 111 mmol/L   CO2 22 22 - 32 mmol/L   Glucose, Bld 139 (H) 70 - 99 mg/dL    Comment: Glucose reference range applies only to samples taken after fasting for at least 8 hours.   BUN 12 8 - 23 mg/dL   Creatinine, Ser 9.14 0.44 - 1.00 mg/dL   Calcium  7.8 (L) 8.9 - 10.3 mg/dL   GFR, Estimated >39 >39 mL/min    Comment: (NOTE) Calculated using the CKD-EPI Creatinine Equation (2021)    Anion gap 3 (L) 5 - 15    Comment: Performed at [facility], [address] [place], [place] [address]  CBC     Status: Abnormal   Collection Time: 12/09/23  5:13 AM  Result Value Ref Range   WBC 6.3 4.0 - 10.5 K/uL   RBC 3.44 (L) 3.87 - 5.11 MIL/uL   Hemoglobin 10.5 (L) 12.0 - 15.0 g/dL   HCT 67.2 (L) 63.9 - 53.9 %   MCV 95.1 80.0 - 100.0 fL   MCH 30.5 26.0 - 34.0 pg   MCHC 32.1 30.0 - 36.0 g/dL   RDW 86.2 88.4 - 84.4 %   Platelets 64 (L) 150 - 400 K/uL    Comment: Immature Platelet Fraction may be clinically indicated, consider ordering this additional test [id number]    nRBC 0.0 0.0 - 0.2 %    Comment: Performed at [facility], [address] [place], [place] [address]  Glucose, capillary     Status: Abnormal   Collection Time: 12/09/23  7:50 AM  Result Value Ref Range   Glucose-Capillary 162 (H) 70 - 99 mg/dL    Comment: Glucose reference range applies only to samples taken after fasting for at least 8 hours.  Glucose, capillary     Status: Abnormal   Collection Time: 12/09/23 11:46 AM  Result Value Ref Range   Glucose-Capillary 152 (H) 70 - 99 mg/dL    Comment: Glucose reference range applies only to samples taken after fasting for at least 8 hours.  CBC with Differential     Status: Abnormal   Collection Time: 12/11/23  9:06 PM  Result Value Ref Range   WBC 3.8 (L) 4.0 - 10.5 K/uL   RBC 3.75 (L) 3.87 - 5.11 MIL/uL   Hemoglobin 11.2 (L) 12.0 - 15.0 g/dL   HCT 66.1 (L) 63.9 - 53.9 %   MCV 90.1 80.0 - 100.0 fL   MCH 29.9 26.0 - 34.0 pg   MCHC 33.1 30.0 - 36.0 g/dL   RDW 86.8 88.4 - 84.4 %   Platelets 80 (L) 150 - 400 K/uL    Comment: Immature Platelet Fraction may be clinically indicated, consider ordering this additional test [id number]    nRBC 0.0 0.0 - 0.2 %   Neutrophils Relative % 67 %   Neutro Abs 2.6 1.7 - 7.7 K/uL   Lymphocytes Relative 16 %   Lymphs Abs 0.6 (L) 0.7 - 4.0 K/uL   Monocytes Relative 15 %   Monocytes Absolute 0.6 0.1 -  1.0 K/uL   Eosinophils Relative 1 %   Eosinophils Absolute 0.0 0.0 - 0.5 K/uL   Basophils Relative 0 %   Basophils Absolute 0.0 0.0 - 0.1 K/uL   Immature Granulocytes 1 %   Abs Immature Granulocytes 0.02 0.00 - 0.07 K/uL    Comment: Performed at [facility] [address] [place], [place] [address]  Comprehensive metabolic panel     Status: Abnormal   Collection Time: 12/11/23  9:06 PM  Result Value Ref Range   Sodium 138 135 - 145 mmol/L   Potassium 3.5 3.5 - 5.1 mmol/L   Chloride 106 98 - 111 mmol/L   CO2 23 22 - 32 mmol/L   Glucose, Bld 144 (H) 70 - 99 mg/dL    Comment: Glucose reference range applies only to samples taken after fasting for at  least 8 hours.   BUN 11 8 - 23 mg/dL   Creatinine, Ser 9.25 0.44 - 1.00 mg/dL   Calcium  8.5 (L) 8.9 - 10.3 mg/dL   Total Protein 7.5 6.5 - 8.1 g/dL   Albumin 2.8 (L) 3.5 - 5.0 g/dL   AST 16 15 - 41 U/L   ALT 10 0 - 44 U/L   Alkaline Phosphatase 40 38 - 126 U/L   Total Bilirubin 0.6 0.0 - 1.2 mg/dL   GFR, Estimated >39 >39 mL/min    Comment: (NOTE) Calculated using the CKD-EPI Creatinine Equation (2021)    Anion gap 9 5 - 15    Comment: Performed at [facility], [address] [place], [place] [address]  Lipase, blood     Status: None   Collection Time: 12/11/23  9:06 PM  Result Value Ref Range   Lipase 23 11 - 51 U/L    Comment: Performed at [facility], [address] [place], [place] [address]  Brain natriuretic peptide     Status: None   Collection Time: 12/11/23  9:06 PM  Result Value Ref Range   B Natriuretic Peptide 96.8 0.0 - 100.0 pg/mL    Comment: Performed at [facility], [address] [place], [place] [address]  Troponin I (High Sensitivity)     Status: None   Collection Time: 12/11/23  9:06 PM  Result Value Ref Range   Troponin I (High Sensitivity) 13 <18 ng/L    Comment: (NOTE) Elevated high sensitivity troponin I (hsTnI) values and significant  changes across serial measurements may suggest ACS but many other  chronic and acute conditions are known to elevate hsTnI results.  Refer to the Links section for chest pain algorithms and additional  guidance. Performed at [facility], [address] [place], [place] [address]   Procalcitonin     Status: None   Collection Time: 12/11/23  9:06 PM  Result Value Ref Range   Procalcitonin 5.22 ng/mL    Comment:        Interpretation: PCT > 2 ng/mL: Systemic infection (sepsis) is likely, unless other causes are known. (NOTE)       Sepsis PCT Algorithm           Lower Respiratory Tract                                      Infection PCT Algorithm     ----------------------------     ----------------------------         PCT < 0.25 ng/mL  PCT < 0.10 ng/mL          Strongly encourage             Strongly discourage   discontinuation of antibiotics    initiation of antibiotics    ----------------------------     -----------------------------       PCT 0.25 - 0.50 ng/mL            PCT 0.10 - 0.25 ng/mL               OR       >80% decrease in PCT            Discourage initiation of                                            antibiotics      Encourage discontinuation           of antibiotics    ----------------------------     -----------------------------         PCT >= 0.50 ng/mL              PCT 0.26 - 0.50 ng/mL               AND       <80% decrease in PCT              Encourage initiation of                                             antibiotics       Encourage continuation           of antibiotics    ----------------------------     -----------------------------        PCT >= 0.50 ng/mL                  PCT > 0.50 ng/mL               AND         increase in PCT                  Strongly encourage                                      initiation of antibiotics    Strongly encourage escalation           of antibiotics                                     -----------------------------                                           PCT <= 0.25 ng/mL                                                 OR                                        >  80% decrease in PCT                                      Discontinue / Do not initiate                                             antibiotics  Performed at [facility], [address] [place], [place] [address]   Lactic acid, plasma     Status: None   Collection Time: 12/11/23  9:06 PM  Result Value Ref Range   Lactic Acid, Venous 1.0 0.5 - 1.9 mmol/L    Comment: Performed at [facility], [address] [place], [place] [address]  Blood culture (routine x 2)      Status: None   Collection Time: 12/11/23  9:06 PM   Specimen: BLOOD  Result Value Ref Range   Specimen Description BLOOD RIGHT AC    Special Requests      BOTTLES DRAWN AEROBIC AND ANAEROBIC Blood Culture adequate volume   Culture      NO GROWTH 5 DAYS Performed at [facility], [address], [place], [place] [address]    Report Status 12/16/2023 FINAL   Protime-INR     Status: Abnormal   Collection Time: 12/11/23  9:06 PM  Result Value Ref Range   Prothrombin  Time 31.9 (H) 11.4 - 15.2 seconds   INR 2.9 (H) 0.8 - 1.2    Comment: (NOTE) INR goal varies based on device and disease states. Performed at [facility], [address] [place], [place] [address]   Procalcitonin     Status: None   Collection Time: 12/11/23 11:09 PM  Result Value Ref Range   Procalcitonin 5.18 ng/mL    Comment:        Interpretation: PCT > 2 ng/mL: Systemic infection (sepsis) is likely, unless other causes are known. (NOTE)       Sepsis PCT Algorithm           Lower Respiratory Tract                                      Infection PCT Algorithm    ----------------------------     ----------------------------         PCT < 0.25 ng/mL                PCT < 0.10 ng/mL          Strongly encourage             Strongly discourage   discontinuation of antibiotics    initiation of antibiotics    ----------------------------     -----------------------------       PCT 0.25 - 0.50 ng/mL            PCT 0.10 - 0.25 ng/mL               OR       >80% decrease in PCT            Discourage initiation of  antibiotics      Encourage discontinuation           of antibiotics    ----------------------------     -----------------------------         PCT >= 0.50 ng/mL              PCT 0.26 - 0.50 ng/mL               AND       <80% decrease in PCT              Encourage initiation of                                             antibiotics        Encourage continuation           of antibiotics    ----------------------------     -----------------------------        PCT >= 0.50 ng/mL                  PCT > 0.50 ng/mL               AND         increase in PCT                  Strongly encourage                                      initiation of antibiotics    Strongly encourage escalation           of antibiotics                                     -----------------------------                                           PCT <= 0.25 ng/mL                                                 OR                                        > 80% decrease in PCT                                      Discontinue / Do not initiate                                             antibiotics  Performed at [facility], [address] [place], [place] [address]   Lactic acid, plasma     Status: None   Collection  Time: 12/11/23 11:10 PM  Result Value Ref Range   Lactic Acid, Venous 0.7 0.5 - 1.9 mmol/L    Comment: Performed at [facility], [address] [place], [place] [address]  Blood gas, venous     Status: Abnormal   Collection Time: 12/11/23 11:10 PM  Result Value Ref Range   pH, Ven 7.37 7.25 - 7.43   pCO2, Ven 43 (L) 44 - 60 mmHg   pO2, Ven 50 (H) 32 - 45 mmHg   Bicarbonate 24.9 20.0 - 28.0 mmol/L   Acid-base deficit 0.6 0.0 - 2.0 mmol/L   O2 Saturation 82.8 %   Patient temperature 37.0    Collection site VEIN     Comment: Performed at [facility], [address], [place], [place] [address]  Troponin I (High Sensitivity)     Status: None   Collection Time: 12/11/23 11:10 PM  Result Value Ref Range   Troponin I (High Sensitivity) 12 <18 ng/L    Comment: (NOTE) Elevated high sensitivity troponin I (hsTnI) values and significant  changes across serial measurements may suggest ACS but many other  chronic and acute conditions are known to elevate hsTnI results.  Refer to the Links section for chest pain  algorithms and additional  guidance. Performed at [facility], [address] [place], [place] [address]   CBG monitoring, ED     Status: None   Collection Time: 12/12/23  2:28 AM  Result Value Ref Range   Glucose-Capillary 91 70 - 99 mg/dL    Comment: Glucose reference range applies only to samples taken after fasting for at least 8 hours.  Blood culture (routine x 2)     Status: None   Collection Time: 12/12/23  2:37 AM   Specimen: BLOOD  Result Value Ref Range   Specimen Description BLOOD BLOOD RIGHT ARM    Special Requests      BOTTLES DRAWN AEROBIC AND ANAEROBIC Blood Culture results may not be optimal due to an inadequate volume of blood received in culture bottles   Culture      NO GROWTH 5 DAYS Performed at [facility], [address] [place], [place] [address]    Report Status 12/17/2023 FINAL   Basic metabolic panel     Status: Abnormal   Collection Time: 12/12/23  4:25 AM  Result Value Ref Range   Sodium 138 135 - 145 mmol/L   Potassium 3.2 (L) 3.5 - 5.1 mmol/L   Chloride 103 98 - 111 mmol/L   CO2 24 22 - 32 mmol/L   Glucose, Bld 99 70 - 99 mg/dL    Comment: Glucose reference range applies only to samples taken after fasting for at least 8 hours.   BUN 11 8 - 23 mg/dL   Creatinine, Ser 9.25 0.44 - 1.00 mg/dL   Calcium  8.4 (L) 8.9 - 10.3 mg/dL   GFR, Estimated >39 >39 mL/min    Comment: (NOTE) Calculated using the CKD-EPI Creatinine Equation (2021)    Anion gap 11 5 - 15    Comment: Performed at [facility], [address] [place], [place] [address]  CBC     Status: Abnormal   Collection Time: 12/12/23  4:25 AM  Result Value Ref Range   WBC 4.0 4.0 - 10.5 K/uL   RBC 3.50 (L) 3.87 - 5.11 MIL/uL   Hemoglobin 10.6 (L) 12.0 - 15.0 g/dL   HCT 67.0 (L) 63.9 - 53.9 %   MCV 94.0 80.0 -  100.0 fL   MCH 30.3 26.0 - 34.0 pg   MCHC 32.2 30.0 - 36.0 g/dL   RDW 86.6 88.4 - 84.4 %   Platelets 84 (L) 150 - 400 K/uL    Comment: Immature  Platelet Fraction may be clinically indicated, consider ordering this additional test [id number] REPEATED TO VERIFY    nRBC 0.0 0.0 - 0.2 %    Comment: Performed at [facility], [address] [place], [place] [address]  Protime-INR     Status: Abnormal   Collection Time: 12/12/23  7:26 AM  Result Value Ref Range   Prothrombin  Time 32.6 (H) 11.4 - 15.2 seconds   INR 3.0 (H) 0.8 - 1.2    Comment: (NOTE) INR goal varies based on device and disease states. Performed at [facility], [address] [place], [place] [address]   CBG monitoring, ED     Status: None   Collection Time: 12/12/23  7:52 AM  Result Value Ref Range   Glucose-Capillary 98 70 - 99 mg/dL    Comment: Glucose reference range applies only to samples taken after fasting for at least 8 hours.  Urinalysis, w/ Reflex to Culture (Infection Suspected) -Urine, Clean Catch     Status: Abnormal   Collection Time: 12/12/23  9:00 AM  Result Value Ref Range   Specimen Source URINE, CLEAN CATCH    Color, Urine YELLOW (A) YELLOW   APPearance CLEAR (A) CLEAR   Specific Gravity, Urine >1.046 (H) 1.005 - 1.030   pH 5.0 5.0 - 8.0   Glucose, UA NEGATIVE NEGATIVE mg/dL   Hgb urine dipstick NEGATIVE NEGATIVE   Bilirubin Urine NEGATIVE NEGATIVE   Ketones, ur 20 (A) NEGATIVE mg/dL   Protein, ur NEGATIVE NEGATIVE mg/dL   Nitrite NEGATIVE NEGATIVE   Leukocytes,Ua NEGATIVE NEGATIVE   RBC / HPF 0-5 0 - 5 RBC/hpf   WBC, UA 11-20 0 - 5 WBC/hpf    Comment:        Reflex urine culture not performed if WBC <=10, OR if Squamous epithelial cells >5. If Squamous epithelial cells >5 suggest recollection.    Bacteria, UA NONE SEEN NONE SEEN   Squamous Epithelial / HPF 0-5 0 - 5 /HPF   Mucus PRESENT     Comment: Performed at [facility], [address] [place], [place] [address]  Urine Culture     Status: None   Collection Time: 12/12/23  9:00 AM   Specimen: Urine, Clean Catch  Result Value Ref Range    Specimen Description      URINE, CLEAN CATCH Performed at [facility], [address], [place], [place] [address]    Special Requests      NONE Reflexed from [id number] Performed at [facility], [address] [place], [place] [address]    Culture      NO GROWTH Performed at [facility], [address], [place], [place] [address]    Report Status 12/13/2023 FINAL   CBG monitoring, ED     Status: Abnormal   Collection Time: 12/12/23 12:46 PM  Result Value Ref Range   Glucose-Capillary 122 (H) 70 - 99 mg/dL    Comment: Glucose reference range applies only to samples taken after fasting for at least 8 hours.  Glucose, capillary     Status: Abnormal   Collection Time: 12/12/23  5:37 PM  Result Value Ref Range   Glucose-Capillary 137 (H) 70 - 99 mg/dL    Comment: Glucose  reference range applies only to samples taken after fasting for at least 8 hours.  Glucose, capillary     Status: Abnormal   Collection Time: 12/12/23  8:57 PM  Result Value Ref Range   Glucose-Capillary 158 (H) 70 - 99 mg/dL    Comment: Glucose reference range applies only to samples taken after fasting for at least 8 hours.  Protime-INR     Status: Abnormal   Collection Time: 12/13/23  3:27 AM  Result Value Ref Range   Prothrombin  Time 29.5 (H) 11.4 - 15.2 seconds   INR 2.6 (H) 0.8 - 1.2    Comment: (NOTE) INR goal varies based on device and disease states. Performed at [facility], [address] [place], [place] [address]   Basic metabolic panel with GFR     Status: Abnormal   Collection Time: 12/13/23  3:27 AM  Result Value Ref Range   Sodium 139 135 - 145 mmol/L   Potassium 3.9 3.5 - 5.1 mmol/L   Chloride 105 98 - 111 mmol/L   CO2 25 22 - 32 mmol/L   Glucose, Bld 131 (H) 70 - 99 mg/dL    Comment: Glucose reference range applies only to samples taken after fasting for at least 8 hours.   BUN 10 8 - 23 mg/dL   Creatinine, Ser 9.35 0.44 - 1.00 mg/dL   Calcium  8.2 (L)  8.9 - 10.3 mg/dL   GFR, Estimated >39 >39 mL/min    Comment: (NOTE) Calculated using the CKD-EPI Creatinine Equation (2021)    Anion gap 9 5 - 15    Comment: Performed at [facility], [address] [place], [place] [address]  CBC     Status: Abnormal   Collection Time: 12/13/23  3:27 AM  Result Value Ref Range   WBC 5.0 4.0 - 10.5 K/uL   RBC 3.54 (L) 3.87 - 5.11 MIL/uL   Hemoglobin 10.7 (L) 12.0 - 15.0 g/dL   HCT 67.2 (L) 63.9 - 53.9 %   MCV 92.4 80.0 - 100.0 fL   MCH 30.2 26.0 - 34.0 pg   MCHC 32.7 30.0 - 36.0 g/dL   RDW 86.4 88.4 - 84.4 %   Platelets 87 (L) 150 - 400 K/uL    Comment: Immature Platelet Fraction may be clinically indicated, consider ordering this additional test [id number] REPEATED TO VERIFY    nRBC 0.0 0.0 - 0.2 %    Comment: Performed at [facility], [address] [place], [place] [address]  Magnesium      Status: None   Collection Time: 12/13/23  3:27 AM  Result Value Ref Range   Magnesium  2.1 1.7 - 2.4 mg/dL    Comment: Performed at [facility], [address] [place], [place] [address]  Glucose, capillary     Status: Abnormal   Collection Time: 12/13/23  7:37 AM  Result Value Ref Range   Glucose-Capillary 154 (H) 70 - 99 mg/dL    Comment: Glucose reference range applies only to samples taken after fasting for at least 8 hours.  Glucose, capillary     Status: Abnormal   Collection Time: 12/13/23 12:08 PM  Result Value Ref Range   Glucose-Capillary 180 (H) 70 - 99 mg/dL    Comment: Glucose reference range applies only to samples taken after fasting for at least 8 hours.  Glucose, capillary     Status: Abnormal   Collection Time: 12/13/23  4:14 PM  Result Value Ref Range   Glucose-Capillary 207 (H)  70 - 99 mg/dL    Comment: Glucose reference range applies only to samples taken after fasting for at least 8 hours.  Glucose, capillary     Status: Abnormal   Collection Time: 12/13/23  9:38 PM  Result Value Ref Range    Glucose-Capillary 126 (H) 70 - 99 mg/dL    Comment: Glucose reference range applies only to samples taken after fasting for at least 8 hours.  Protime-INR     Status: Abnormal   Collection Time: 12/14/23  5:03 AM  Result Value Ref Range   Prothrombin  Time 25.0 (H) 11.4 - 15.2 seconds   INR 2.1 (H) 0.8 - 1.2    Comment: (NOTE) INR goal varies based on device and disease states. Performed at [facility], [address] [place], [place] [address]   Glucose, capillary     Status: Abnormal   Collection Time: 12/14/23  9:12 AM  Result Value Ref Range   Glucose-Capillary 167 (H) 70 - 99 mg/dL    Comment: Glucose reference range applies only to samples taken after fasting for at least 8 hours.  Glucose, capillary     Status: Abnormal   Collection Time: 12/14/23 12:13 PM  Result Value Ref Range   Glucose-Capillary 173 (H) 70 - 99 mg/dL    Comment: Glucose reference range applies only to samples taken after fasting for at least 8 hours.  Glucose, capillary     Status: Abnormal   Collection Time: 12/14/23  4:40 PM  Result Value Ref Range   Glucose-Capillary 155 (H) 70 - 99 mg/dL    Comment: Glucose reference range applies only to samples taken after fasting for at least 8 hours.  Glucose, capillary     Status: Abnormal   Collection Time: 12/14/23  8:23 PM  Result Value Ref Range   Glucose-Capillary 202 (H) 70 - 99 mg/dL    Comment: Glucose reference range applies only to samples taken after fasting for at least 8 hours.  Protime-INR     Status: Abnormal   Collection Time: 12/15/23  5:26 AM  Result Value Ref Range   Prothrombin  Time 22.9 (H) 11.4 - 15.2 seconds   INR 1.9 (H) 0.8 - 1.2    Comment: (NOTE) INR goal varies based on device and disease states. Performed at [facility], [address] [place], [place] [address]   CBC     Status: Abnormal   Collection Time: 12/15/23  5:26 AM  Result Value Ref Range   WBC 3.8 (L) 4.0 - 10.5 K/uL   RBC 3.54 (L) 3.87 -  5.11 MIL/uL   Hemoglobin 10.5 (L) 12.0 - 15.0 g/dL   HCT 67.0 (L) 63.9 - 53.9 %   MCV 92.9 80.0 - 100.0 fL   MCH 29.7 26.0 - 34.0 pg   MCHC 31.9 30.0 - 36.0 g/dL   RDW 86.1 88.4 - 84.4 %   Platelets 104 (L) 150 - 400 K/uL    Comment: Immature Platelet Fraction may be clinically indicated, consider ordering this additional test [id number]    nRBC 0.0 0.0 - 0.2 %    Comment: Performed at [facility], [address] [place], [place] [address]  Basic metabolic panel with GFR     Status: Abnormal   Collection Time: 12/15/23  5:26 AM  Result Value Ref Range   Sodium 136 135 - 145 mmol/L   Potassium 4.2 3.5 - 5.1 mmol/L   Chloride 101 98 - 111 mmol/L   CO2 26 22 -  32 mmol/L   Glucose, Bld 170 (H) 70 - 99 mg/dL    Comment: Glucose reference range applies only to samples taken after fasting for at least 8 hours.   BUN 10 8 - 23 mg/dL   Creatinine, Ser 9.47 0.44 - 1.00 mg/dL   Calcium  8.2 (L) 8.9 - 10.3 mg/dL   GFR, Estimated >39 >39 mL/min    Comment: (NOTE) Calculated using the CKD-EPI Creatinine Equation (2021)    Anion gap 9 5 - 15    Comment: Performed at [facility], [address] [place], [place] [address]  Glucose, capillary     Status: Abnormal   Collection Time: 12/15/23  7:44 AM  Result Value Ref Range   Glucose-Capillary 148 (H) 70 - 99 mg/dL    Comment: Glucose reference range applies only to samples taken after fasting for at least 8 hours.  Glucose, capillary     Status: Abnormal   Collection Time: 12/15/23 11:55 AM  Result Value Ref Range   Glucose-Capillary 172 (H) 70 - 99 mg/dL    Comment: Glucose reference range applies only to samples taken after fasting for at least 8 hours.  Glucose, capillary     Status: Abnormal   Collection Time: 12/15/23  4:33 PM  Result Value Ref Range   Glucose-Capillary 268 (H) 70 - 99 mg/dL    Comment: Glucose reference range applies only to samples taken after fasting for at least 8 hours.  Glucose, capillary      Status: Abnormal   Collection Time: 12/15/23  8:58 PM  Result Value Ref Range   Glucose-Capillary 276 (H) 70 - 99 mg/dL    Comment: Glucose reference range applies only to samples taken after fasting for at least 8 hours.  Glucose, capillary     Status: Abnormal   Collection Time: 12/16/23  1:07 AM  Result Value Ref Range   Glucose-Capillary 234 (H) 70 - 99 mg/dL    Comment: Glucose reference range applies only to samples taken after fasting for at least 8 hours.  Protime-INR     Status: Abnormal   Collection Time: 12/16/23  3:41 AM  Result Value Ref Range   Prothrombin  Time 23.2 (H) 11.4 - 15.2 seconds   INR 1.9 (H) 0.8 - 1.2    Comment: (NOTE) INR goal varies based on device and disease states. Performed at [facility], [address] [place], [place] [address]   CBC     Status: Abnormal   Collection Time: 12/16/23  3:41 AM  Result Value Ref Range   WBC 5.4 4.0 - 10.5 K/uL   RBC 3.42 (L) 3.87 - 5.11 MIL/uL   Hemoglobin 10.4 (L) 12.0 - 15.0 g/dL   HCT 68.2 (L) 63.9 - 53.9 %   MCV 92.7 80.0 - 100.0 fL   MCH 30.4 26.0 - 34.0 pg   MCHC 32.8 30.0 - 36.0 g/dL   RDW 86.6 88.4 - 84.4 %   Platelets 110 (L) 150 - 400 K/uL   nRBC 0.0 0.0 - 0.2 %    Comment: Performed at [facility], [address], [place], [place] [address]  Basic metabolic panel with GFR     Status: Abnormal   Collection Time: 12/16/23  3:41 AM  Result Value Ref Range   Sodium 137 135 - 145 mmol/L   Potassium 4.6 3.5 - 5.1 mmol/L   Chloride 101 98 - 111 mmol/L   CO2 27 22 - 32 mmol/L   Glucose, Bld 220 (  H) 70 - 99 mg/dL    Comment: Glucose reference range applies only to samples taken after fasting for at least 8 hours.   BUN 13 8 - 23 mg/dL   Creatinine, Ser 9.30 0.44 - 1.00 mg/dL   Calcium  8.6 (L) 8.9 - 10.3 mg/dL   GFR, Estimated >39 >39 mL/min    Comment: (NOTE) Calculated using the CKD-EPI Creatinine Equation (2021)    Anion gap 9 5 - 15    Comment: Performed at [facility], [address] [place], [place] [address]  Glucose, capillary     Status: Abnormal   Collection Time: 12/16/23  7:52 AM  Result Value Ref Range   Glucose-Capillary 160 (H) 70 - 99 mg/dL    Comment: Glucose reference range applies only to samples taken after fasting for at least 8 hours.  Glucose, capillary     Status: Abnormal   Collection Time: 12/16/23 11:32 AM  Result Value Ref Range   Glucose-Capillary 168 (H) 70 - 99 mg/dL    Comment: Glucose reference range applies only to samples taken after fasting for at least 8 hours.  Glucose, capillary     Status: Abnormal   Collection Time: 12/16/23  4:24 PM  Result Value Ref Range   Glucose-Capillary 277 (H) 70 - 99 mg/dL    Comment: Glucose reference range applies only to samples taken after fasting for at least 8 hours.  Glucose, capillary     Status: Abnormal   Collection Time: 12/16/23  7:38 PM  Result Value Ref Range   Glucose-Capillary 234 (H) 70 - 99 mg/dL    Comment: Glucose reference range applies only to samples taken after fasting for at least 8 hours.  Protime-INR     Status: Abnormal   Collection Time: 12/17/23  3:06 AM  Result Value Ref Range   Prothrombin  Time 25.5 (H) 11.4 - 15.2 seconds   INR 2.2 (H) 0.8 - 1.2    Comment: (NOTE) INR goal varies based on device and disease states. Performed at [facility], [address] [place], [place] [address]   CBC     Status: Abnormal   Collection Time: 12/17/23  3:06 AM  Result Value Ref Range   WBC 6.3 4.0 - 10.5 K/uL   RBC 3.52 (L) 3.87 - 5.11 MIL/uL   Hemoglobin 10.3 (L) 12.0 - 15.0 g/dL   HCT 67.1 (L) 63.9 - 53.9 %   MCV 93.2 80.0 - 100.0 fL   MCH 29.3 26.0 - 34.0 pg   MCHC 31.4 30.0 - 36.0 g/dL   RDW 86.3 88.4 - 84.4 %   Platelets 129 (L) 150 - 400 K/uL   nRBC 0.0 0.0 - 0.2 %    Comment: Performed at [facility], [address] [place], [place] [address]  Basic metabolic panel with GFR     Status: Abnormal   Collection  Time: 12/17/23  3:06 AM  Result Value Ref Range   Sodium 133 (L) 135 - 145 mmol/L   Potassium 4.2 3.5 - 5.1 mmol/L   Chloride 101 98 - 111 mmol/L   CO2 27 22 - 32 mmol/L   Glucose, Bld 227 (H) 70 - 99 mg/dL    Comment: Glucose reference range applies only to samples taken after fasting for at least 8 hours.   BUN 18 8 - 23 mg/dL   Creatinine, Ser 9.25 0.44 - 1.00 mg/dL   Calcium  8.6 (L) 8.9 - 10.3 mg/dL   GFR, Estimated >39 >  60 mL/min    Comment: (NOTE) Calculated using the CKD-EPI Creatinine Equation (2021)    Anion gap 5 5 - 15    Comment: Performed at [facility], [address] [place], [place] [address]  Glucose, capillary     Status: Abnormal   Collection Time: 12/17/23  8:02 AM  Result Value Ref Range   Glucose-Capillary 193 (H) 70 - 99 mg/dL    Comment: Glucose reference range applies only to samples taken after fasting for at least 8 hours.  Glucose, capillary     Status: Abnormal   Collection Time: 12/17/23 11:36 AM  Result Value Ref Range   Glucose-Capillary 207 (H) 70 - 99 mg/dL    Comment: Glucose reference range applies only to samples taken after fasting for at least 8 hours.  Glucose, capillary     Status: Abnormal   Collection Time: 12/17/23  4:43 PM  Result Value Ref Range   Glucose-Capillary 304 (H) 70 - 99 mg/dL    Comment: Glucose reference range applies only to samples taken after fasting for at least 8 hours.  Glucose, capillary     Status: Abnormal   Collection Time: 12/17/23  7:29 PM  Result Value Ref Range   Glucose-Capillary 354 (H) 70 - 99 mg/dL    Comment: Glucose reference range applies only to samples taken after fasting for at least 8 hours.  Protime-INR     Status: Abnormal   Collection Time: 12/18/23  4:01 AM  Result Value Ref Range   Prothrombin  Time 27.2 (H) 11.4 - 15.2 seconds   INR 2.4 (H) 0.8 - 1.2    Comment: (NOTE) INR goal varies based on device and disease states. Performed at [facility], [address] [place], [place] [address]   CBC     Status: Abnormal   Collection Time: 12/18/23  4:01 AM  Result Value Ref Range   WBC 6.5 4.0 - 10.5 K/uL   RBC 3.64 (L) 3.87 - 5.11 MIL/uL   Hemoglobin 10.7 (L) 12.0 - 15.0 g/dL   HCT 66.1 (L) 63.9 - 53.9 %   MCV 92.9 80.0 - 100.0 fL   MCH 29.4 26.0 - 34.0 pg   MCHC 31.7 30.0 - 36.0 g/dL   RDW 86.6 88.4 - 84.4 %   Platelets 132 (L) 150 - 400 K/uL   nRBC 0.0 0.0 - 0.2 %    Comment: Performed at [facility], [address], [place], [place] [address]  Basic metabolic panel with GFR     Status: Abnormal   Collection Time: 12/18/23  4:01 AM  Result Value Ref Range   Sodium 137 135 - 145 mmol/L   Potassium 4.0 3.5 - 5.1 mmol/L   Chloride 102 98 - 111 mmol/L   CO2 27 22 - 32 mmol/L   Glucose, Bld 203 (H) 70 - 99 mg/dL    Comment: Glucose reference range applies only to samples taken after fasting for at least 8 hours.   BUN 20 8 - 23 mg/dL   Creatinine, Ser 9.25 0.44 - 1.00 mg/dL   Calcium  8.7 (L) 8.9 - 10.3 mg/dL   GFR, Estimated >39 >39 mL/min    Comment: (NOTE) Calculated using the CKD-EPI Creatinine Equation (2021)    Anion gap 8 5 - 15    Comment: Performed at [facility], [address] [place], [place] [address]  Glucose, capillary     Status: Abnormal   Collection Time: 12/18/23  8:24 AM  Result Value Ref Range   Glucose-Capillary 205 (H) 70 - 99 mg/dL    Comment: Glucose reference range applies only to samples taken after fasting for at least 8 hours.  Glucose, capillary     Status: Abnormal   Collection Time: 12/18/23 11:41 AM  Result Value Ref Range   Glucose-Capillary 236 (H) 70 - 99 mg/dL    Comment: Glucose reference range applies only to samples taken after fasting for at least 8 hours.  Glucose, capillary     Status: Abnormal   Collection Time: 12/18/23  5:19 PM  Result Value Ref Range   Glucose-Capillary 252 (H) 70 - 99 mg/dL    Comment: Glucose reference range applies only to samples taken after  fasting for at least 8 hours.  Glucose, capillary     Status: Abnormal   Collection Time: 12/18/23  8:32 PM  Result Value Ref Range   Glucose-Capillary 261 (H) 70 - 99 mg/dL    Comment: Glucose reference range applies only to samples taken after fasting for at least 8 hours.  Protime-INR     Status: Abnormal   Collection Time: 12/19/23  1:37 AM  Result Value Ref Range   Prothrombin  Time 27.6 (H) 11.4 - 15.2 seconds   INR 2.4 (H) 0.8 - 1.2    Comment: (NOTE) INR goal varies based on device and disease states. Performed at [facility], [address] [place], [place] [address]   CBC     Status: Abnormal   Collection Time: 12/19/23  1:37 AM  Result Value Ref Range   WBC 7.0 4.0 - 10.5 K/uL   RBC 3.73 (L) 3.87 - 5.11 MIL/uL   Hemoglobin 11.1 (L) 12.0 - 15.0 g/dL   HCT 65.4 (L) 63.9 - 53.9 %   MCV 92.5 80.0 - 100.0 fL   MCH 29.8 26.0 - 34.0 pg   MCHC 32.2 30.0 - 36.0 g/dL   RDW 86.6 88.4 - 84.4 %   Platelets 133 (L) 150 - 400 K/uL   nRBC 0.0 0.0 - 0.2 %    Comment: Performed at [facility], [address] [place], [place] [address]  Basic metabolic panel with GFR     Status: Abnormal   Collection Time: 12/19/23  1:37 AM  Result Value Ref Range   Sodium 137 135 - 145 mmol/L   Potassium 4.6 3.5 - 5.1 mmol/L   Chloride 103 98 - 111 mmol/L   CO2 26 22 - 32 mmol/L   Glucose, Bld 277 (H) 70 - 99 mg/dL    Comment: Glucose reference range applies only to samples taken after fasting for at least 8 hours.   BUN 19 8 - 23 mg/dL   Creatinine, Ser 9.15 0.44 - 1.00 mg/dL   Calcium  8.5 (L) 8.9 - 10.3 mg/dL   GFR, Estimated >39 >39 mL/min    Comment: (NOTE) Calculated using the CKD-EPI Creatinine Equation (2021)    Anion gap 8 5 - 15    Comment: Performed at [facility], [address] [place], [place] [address]  Glucose, capillary     Status: Abnormal   Collection Time: 12/19/23 11:53 AM  Result Value Ref Range   Glucose-Capillary 134 (H) 70 - 99  mg/dL    Comment: Glucose reference range applies only to samples taken after fasting for at least 8 hours.  Glucose, capillary     Status: Abnormal   Collection Time: 12/19/23  5:33 PM  Result Value Ref Range   Glucose-Capillary  380 (H) 70 - 99 mg/dL    Comment: Glucose reference range applies only to samples taken after fasting for at least 8 hours.  Glucose, capillary     Status: Abnormal   Collection Time: 12/19/23  8:19 PM  Result Value Ref Range   Glucose-Capillary 331 (H) 70 - 99 mg/dL    Comment: Glucose reference range applies only to samples taken after fasting for at least 8 hours.  Protime-INR     Status: Abnormal   Collection Time: 12/20/23  5:41 AM  Result Value Ref Range   Prothrombin  Time 32.4 (H) 11.4 - 15.2 seconds   INR 3.0 (H) 0.8 - 1.2    Comment: (NOTE) INR goal varies based on device and disease states. Performed at [facility], [address] [place], [place] [address]   CBC     Status: Abnormal   Collection Time: 12/20/23  5:41 AM  Result Value Ref Range   WBC 5.9 4.0 - 10.5 K/uL   RBC 3.39 (L) 3.87 - 5.11 MIL/uL   Hemoglobin 10.1 (L) 12.0 - 15.0 g/dL   HCT 68.4 (L) 63.9 - 53.9 %   MCV 92.9 80.0 - 100.0 fL   MCH 29.8 26.0 - 34.0 pg   MCHC 32.1 30.0 - 36.0 g/dL   RDW 86.2 88.4 - 84.4 %   Platelets 127 (L) 150 - 400 K/uL   nRBC 0.0 0.0 - 0.2 %    Comment: Performed at [facility], [address] [place], [place] [address]  Basic metabolic panel with GFR     Status: Abnormal   Collection Time: 12/20/23  5:41 AM  Result Value Ref Range   Sodium 138 135 - 145 mmol/L   Potassium 4.0 3.5 - 5.1 mmol/L   Chloride 102 98 - 111 mmol/L   CO2 28 22 - 32 mmol/L   Glucose, Bld 231 (H) 70 - 99 mg/dL    Comment: Glucose reference range applies only to samples taken after fasting for at least 8 hours.   BUN 22 8 - 23 mg/dL   Creatinine, Ser 9.15 0.44 - 1.00 mg/dL   Calcium  8.3 (L) 8.9 - 10.3 mg/dL   GFR, Estimated >39 >39 mL/min     Comment: (NOTE) Calculated using the CKD-EPI Creatinine Equation (2021)    Anion gap 8 5 - 15    Comment: Performed at [facility], [address] [place], [place] [address]  Glucose, capillary     Status: Abnormal   Collection Time: 12/20/23  8:36 AM  Result Value Ref Range   Glucose-Capillary 162 (H) 70 - 99 mg/dL    Comment: Glucose reference range applies only to samples taken after fasting for at least 8 hours.   Comment 1 Document in Chart   Glucose, capillary     Status: Abnormal   Collection Time: 12/20/23 11:32 AM  Result Value Ref Range   Glucose-Capillary 227 (H) 70 - 99 mg/dL    Comment: Glucose reference range applies only to samples taken after fasting for at least 8 hours.    Radiology: CT Angio Chest Pulmonary Embolism (PE) W or WO Contrast Result Date: 12/12/2023 CLINICAL DATA:  Pulmonary embolism (PE) suspected, high prob. 73 y.o. adult who returns 2 days after discharge with worsening shortness of breath chest pain found to be hypoxic EXAM: CT ANGIOGRAPHY CHEST WITH CONTRAST TECHNIQUE: Multidetector CT imaging of the chest was performed using the standard protocol during bolus administration of intravenous contrast. Multiplanar CT image  reconstructions and MIPs were obtained to evaluate the vascular anatomy. RADIATION DOSE REDUCTION: This exam was performed according to the departmental dose-optimization program which includes automated exposure control, adjustment of the mA and/or kV according to patient size and/or use of iterative reconstruction technique. CONTRAST:  OMNIPAQUE  IOHEXOL  350 MG/ML SOLN COMPARISON:  Chest x-ray 12/11/2023, CT chest 10/06/2023, PET CT 06/03/2023 FINDINGS: Cardiovascular: Satisfactory opacification of the pulmonary arteries to the segmental level. No evidence of pulmonary embolism. The main pulmonary artery is normal in caliber. Left and right main pulmonary arteries are enlarged in caliber. Normal heart size. No significant  pericardial effusion. The thoracic aorta is normal in caliber. Moderate atherosclerotic plaque. Atherosclerotic plaque of the thoracic aorta. No coronary artery calcifications. Mediastinum/Nodes: No enlarged mediastinal, hilar, or axillary lymph nodes. Thyroid  gland, trachea, and esophagus demonstrate no significant findings. Lungs/Pleura: Interval worsening of Peribronchovascular patchy ground-glass and consolidative opacities most prominent within the right upper lobe. Persistent right perihilar masslike airspace opacity within the right upper lobe measuring 6 x 5.7 cm. Bilateral trace pleural effusion. No pneumothorax. Upper Abdomen: Cholelithiasis. Musculoskeletal: No chest wall abnormality. No suspicious lytic or blastic osseous lesions. No acute displaced fracture. Review of the MIP images confirms the above findings. IMPRESSION: 1. No pulmonary embolus. 2. Enlarged left and right pulmonary arteries-correlate for pulmonary hypertension. 3. Interval worsening of peribronchovascular patchy ground-glass and consolidative opacities most prominent within the right upper lobe. Finding may represent infection/inflammation versus alveolar hemorrhage. 4. Persistent right perihilar masslike airspace opacity within the right upper lobe measuring 6 x 5.7 cm. Finding consistent with post treatment been like consolidation and fibrosis. Recommend attention on follow-up PET CT. 5. Mild trace pleural effusions. Electronically Signed   By: Morgane  Naveau M.D.   On: 12/12/2023 00:19   US  Venous Img Lower Bilateral Result Date: 12/11/2023 EXAM: ULTRASOUND DUPLEX OF THE BILATERAL LOWER EXTREMITY VEINS TECHNIQUE: Duplex ultrasound using B-mode/gray scaled imaging and Doppler spectral analysis and color flow was obtained of the deep venous structures of the bilateral lower extremity. COMPARISON: None. CLINICAL HISTORY: Bilateral leg pain. FINDINGS: No deep venous thrombosis on the left. Occlusive thrombus in the right popliteal  vein extending to the posterior tibial and peroneal veins of the calf. IMPRESSION: 1. Occlusive thrombus in the right popliteal vein extending to the posterior tibial and peroneal veins of the calf. 2. No deep venous thrombosis on the left. Electronically signed by: Pinkie Pebbles MD 12/11/2023 10:04 PM EDT RP Workstation: [id number]   DG Chest 1 View Result Date: 12/11/2023 CLINICAL DATA:  sob EXAM: CHEST  1 VIEW COMPARISON:  Chest x-ray 08/27/2023, CT chest 12/08/2023 FINDINGS: The heart and mediastinal contours are grossly unchanged. Atherosclerotic plaque. Redemonstration of right hilar masslike airspace opacity. Patchy airspace opacity of left lung. No pulmonary edema. No pleural effusion. No pneumothorax. No acute osseous abnormality. IMPRESSION: 1. Redemonstration of right hilar masslike airspace opacity that is better evaluated on CT chest 12/08/2023. 2. Patchy airspace opacity of left lung. 3.  Aortic Atherosclerosis (ICD10-I70.0). Electronically Signed   By: Morgane  Naveau M.D.   On: 12/11/2023 21:47    No results found.  DG Abd Portable 1V Result Date: 12/18/2023 CLINICAL DATA:  Intractable nausea and vomiting. EXAM: PORTABLE ABDOMEN - 1 VIEW COMPARISON:  Abdominal x-ray 03/21/2023 FINDINGS: There are mildly dilated small bowel loops in the left abdomen. There is moderate gaseous distention of the stomach. No suspicious calcifications are identified. No acute osseous abnormalities are seen. There IMPRESSION: Mildly dilated small bowel loops in the left  abdomen. Moderate gaseous distention of the stomach. Findings may be related to ileus or obstruction. Electronically Signed   By: Greig Pique M.D.   On: 12/18/2023 19:17   CT Angio Chest Pulmonary Embolism (PE) W or WO Contrast Result Date: 12/12/2023 CLINICAL DATA:  Pulmonary embolism (PE) suspected, high prob. 73 y.o. adult who returns 2 days after discharge with worsening shortness of breath chest pain found to be hypoxic EXAM: CT  ANGIOGRAPHY CHEST WITH CONTRAST TECHNIQUE: Multidetector CT imaging of the chest was performed using the standard protocol during bolus administration of intravenous contrast. Multiplanar CT image reconstructions and MIPs were obtained to evaluate the vascular anatomy. RADIATION DOSE REDUCTION: This exam was performed according to the departmental dose-optimization program which includes automated exposure control, adjustment of the mA and/or kV according to patient size and/or use of iterative reconstruction technique. CONTRAST:  OMNIPAQUE  IOHEXOL  350 MG/ML SOLN COMPARISON:  Chest x-ray 12/11/2023, CT chest 10/06/2023, PET CT 06/03/2023 FINDINGS: Cardiovascular: Satisfactory opacification of the pulmonary arteries to the segmental level. No evidence of pulmonary embolism. The main pulmonary artery is normal in caliber. Left and right main pulmonary arteries are enlarged in caliber. Normal heart size. No significant pericardial effusion. The thoracic aorta is normal in caliber. Moderate atherosclerotic plaque. Atherosclerotic plaque of the thoracic aorta. No coronary artery calcifications. Mediastinum/Nodes: No enlarged mediastinal, hilar, or axillary lymph nodes. Thyroid  gland, trachea, and esophagus demonstrate no significant findings. Lungs/Pleura: Interval worsening of Peribronchovascular patchy ground-glass and consolidative opacities most prominent within the right upper lobe. Persistent right perihilar masslike airspace opacity within the right upper lobe measuring 6 x 5.7 cm. Bilateral trace pleural effusion. No pneumothorax. Upper Abdomen: Cholelithiasis. Musculoskeletal: No chest wall abnormality. No suspicious lytic or blastic osseous lesions. No acute displaced fracture. Review of the MIP images confirms the above findings. IMPRESSION: 1. No pulmonary embolus. 2. Enlarged left and right pulmonary arteries-correlate for pulmonary hypertension. 3. Interval worsening of peribronchovascular patchy  ground-glass and consolidative opacities most prominent within the right upper lobe. Finding may represent infection/inflammation versus alveolar hemorrhage. 4. Persistent right perihilar masslike airspace opacity within the right upper lobe measuring 6 x 5.7 cm. Finding consistent with post treatment been like consolidation and fibrosis. Recommend attention on follow-up PET CT. 5. Mild trace pleural effusions. Electronically Signed   By: Morgane  Naveau M.D.   On: 12/12/2023 00:19   US  Venous Img Lower Bilateral Result Date: 12/11/2023 EXAM: ULTRASOUND DUPLEX OF THE BILATERAL LOWER EXTREMITY VEINS TECHNIQUE: Duplex ultrasound using B-mode/gray scaled imaging and Doppler spectral analysis and color flow was obtained of the deep venous structures of the bilateral lower extremity. COMPARISON: None. CLINICAL HISTORY: Bilateral leg pain. FINDINGS: No deep venous thrombosis on the left. Occlusive thrombus in the right popliteal vein extending to the posterior tibial and peroneal veins of the calf. IMPRESSION: 1. Occlusive thrombus in the right popliteal vein extending to the posterior tibial and peroneal veins of the calf. 2. No deep venous thrombosis on the left. Electronically signed by: Pinkie Pebbles MD 12/11/2023 10:04 PM EDT RP Workstation: [id number]   DG Chest 1 View Result Date: 12/11/2023 CLINICAL DATA:  sob EXAM: CHEST  1 VIEW COMPARISON:  Chest x-ray 08/27/2023, CT chest 12/08/2023 FINDINGS: The heart and mediastinal contours are grossly unchanged. Atherosclerotic plaque. Redemonstration of right hilar masslike airspace opacity. Patchy airspace opacity of left lung. No pulmonary edema. No pleural effusion. No pneumothorax. No acute osseous abnormality. IMPRESSION: 1. Redemonstration of right hilar masslike airspace opacity that is better evaluated on CT  chest 12/08/2023. 2. Patchy airspace opacity of left lung. 3.  Aortic Atherosclerosis (ICD10-I70.0). Electronically Signed   By: Morgane  Naveau M.D.    On: 12/11/2023 21:47    Assessment and Plan: Patient Active Problem List   Diagnosis Date Noted   Low back pain of over 3 months duration 12/27/2023   Intermittent low back pain 12/27/2023   Intractable low back pain 12/27/2023   Mechanical low back pain 12/27/2023   Multifactorial low back pain 12/27/2023   Hypoxic respiratory failure (HCC) 12/11/2023   Sepsis due to pneumonia (HCC) 12/08/2023   Diabetes mellitus without complication (HCC) 12/08/2023   Sepsis, unspecified organism (HCC) 12/08/2023   Acute diarrhea 12/08/2023   Contusion of elbow and forearm, left, initial encounter 12/08/2023   SIRS (systemic inflammatory response syndrome) (HCC) 08/09/2023   Acute cystitis without hematuria 08/09/2023   Acute GI bleeding 08/02/2023   Lower GI bleed 08/02/2023   Chemotherapy induced neutropenia (HCC) 07/22/2023   Cancer of upper lobe of right lung (HCC) 05/27/2023   Recurrent non-small cell lung cancer (NSCLC) (HCC) 05/27/2023   Mediastinal adenopathy 05/16/2023   (HFpEF) heart failure with preserved ejection fraction (HCC) 05/09/2023   Piriformis muscle pain (Right) 11/01/2022   Lumbar facet joint pain 09/02/2022   Greater trochanteric bursitis (Left) 01/28/2022   Other bursitis of hip, not elsewhere classified (gluteus medius bursa) (Left) 01/28/2022   Chronic low back pain (Right) w/o sciatica 01/07/2022   History of recurrent deep vein thrombosis (DVT) 11/21/2021   Statin declined 11/02/2021   Osteoarthritis of hips (Bilateral) 09/15/2021   Greater trochanteric bursitis (Right) 09/15/2021   Chronic hip pain (Right) 09/15/2021   Chronic hip pain (Bilateral) (R>L) 09/01/2021   Chronic use of opiate for therapeutic purpose 10/22/2020   Metabolic syndrome 08/14/2020   Generalized weakness 08/14/2020   Truncal obesity 08/04/2020   Latex precautions, history of latex allergy 06/24/2020   Hyperglycemia 06/24/2020   Uncomplicated opioid dependence (HCC) 06/15/2020   Spasm of  muscle of lower back 01/29/2020   Abnormal MRI, lumbar spine (08/09/2019) 08/14/2019   Spondylolisthesis, lumbosacral region 07/26/2019   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   Chronic sacroiliac joint pain (Left) 02/28/2019   Small bowel obstruction due to adhesions (HCC) 08/24/2018   Coccygodynia 05/09/2018   Acute deep vein thrombosis (DVT) of distal end of right lower extremity (HCC) 05/08/2018   DDD (degenerative disc disease), lumbosacral 04/11/2018   Chronic anticoagulation (COUMADIN ) 04/11/2018   Severe obesity (BMI 35.0-35.9 with comorbidity) (HCC) 03/15/2018   Secondary osteoarthritis of multiple sites 03/15/2018   Sacral insufficiency fracture 03/15/2018   Chronic low back pain (1ry area of Pain) (Bilateral) w/o sciatica 12/14/2017   Spondylosis without myelopathy or radiculopathy, lumbosacral region 12/14/2017   Other specified dorsopathies, sacral and sacrococcygeal region 12/14/2017   History of allergy to latex 11/09/2017   Grade 1-2 Anterolisthesis of L4/L5 & L5/S1 (stable as of 11/05/2021) 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) 11/02/2017   Lumbar pars defect (L3 and L4) (Bilateral) 11/02/2017   Diabetic peripheral neuropathy (HCC) 11/02/2017   Chronic lower extremity pain (2ry area of Pain) (Bilateral) 11/02/2017   Chronic radicular pain of lower extremity 11/02/2017   Osteoarthritis of hip (Right) 11/02/2017   Chronic low back pain (Bilateral) w/  sciatica (Bilateral) 10/13/2017   Chronic sacroiliac joint pain (Right) 10/13/2017   Chronic pain syndrome 10/13/2017   Long term current use of opiate analgesic 10/13/2017   Pharmacologic therapy 10/13/2017   Disorder of  skeletal system 10/13/2017   Problems influencing health status 10/13/2017   Diverticulosis of colon 06/20/2017   History of pulmonary embolism 06/20/2017   S/P IVC filter 06/20/2017   Positive result for methicillin resistant Staphylococcus aureus (MRSA) screening 05/11/2017   History of colostomy reversal 02/24/2017   Chronic diastolic (congestive) heart failure (HCC) 11/22/2016   HCAP (healthcare-associated pneumonia) 11/18/2016   Edema of both legs 11/15/2016   Benign essential hypertension 08/26/2016   DDD (degenerative disc disease), lumbar 07/14/2016   Acute non-recurrent maxillary sinusitis 05/21/2016   Chronic hip pain (Left) 02/23/2016   Nocturnal leg cramps 02/20/2015   Uncontrolled type 2 diabetes mellitus with hyperglycemia, without long-term current use of insulin  (HCC) 11/12/2014    1. Obstructive chronic bronchitis without exacerbation (HCC) (Primary) She needs refills of her inhalers will provide today  2. Chronic respiratory failure with hypoxia (HCC) She needs portable oxygen will get 6mw  3. Pneumonia due to infectious organism, unspecified laterality, unspecified part of lung She has a CT chest scheduled on Friday apparently - CT Chest Wo Contrast; Future  4. Shortness of breath - Pulmonary function test; Future   General Counseling: I have discussed the findings of the evaluation and examination with Batul.  I have also discussed any further diagnostic evaluation thatmay be needed or ordered today. Dory verbalizes understanding of the findings of todays visit. We also reviewed her medications today and discussed drug interactions and side effects including but not limited excessive drowsiness and altered mental states. We also discussed that there is always a risk not just to her but also people around her. she has been encouraged to call the office with any questions or concerns that should arise related to todays visit.  No orders of the defined types were  placed in this encounter.    Time spent: 24  I have personally obtained a history, examined the patient, evaluated laboratory and imaging results, formulated the assessment and plan and placed orders.    Elfreda DELENA Bathe, MD [facility] Sleep medicine

## 2024-01-10 NOTE — Patient Instructions (Signed)
 Chronic Obstructive Pulmonary Disease  Chronic obstructive pulmonary disease (COPD) is a long-term (chronic) lung problem. When you have COPD, it can feel harder to breathe in or out. The condition may get worse over time. There are things you can do to keep yourself as healthy as possible. What are the causes? Smoking. This is the most common cause. Breathing in fumes, smoke, or chemicals for a long time. Genes that are inherited, which means they are passed down from parent to child. What are the signs or symptoms? Shortness of breath. This may happen all the time. This may get worse when you move your body. This may get worse over time. You may have times when this becomes much worse all of a sudden. These are called flare-ups or exacerbations. A long-term cough, with or without thick mucus. Wheezing. Chest tightness. Feeling tired. Not being able to do activities like you used to do. How is this diagnosed? This condition is diagnosed based on: Your medical history. A physical exam. Lung (pulmonary) function tests. You may have a test that measures the air flow out of the lungs when you breathe out. You may also have tests, including: Chest X-ray. CT scan. Blood tests. How is this treated? This condition may be treated by: Quitting smoking, if you smoke. Using oxygen . Taking medicines. These may include: Inhalers. These have medicines in them that you breathe in. Daily inhalers. These help to prevent symptoms from happening. They are usually taken every day to prevent COPD flare-ups. Quick relief inhalers. These act fast to relieve symptoms. They are used only when needed and provide short-term relief. Other medicines that you breathe in or swallow. These may be used to open the airways, thin mucus, or treat infections. Breathing exercises to help you control or catch your breath. A mucus clearing device, if you have a lot of thick mucus. Pulmonary rehab. A place where you  will learn about your condition and the best ways for you to manage it. Surgery. Follow these instructions at home: Medicines Take your medicines as told by your health care provider. Talk to your provider before taking any cough or allergy medicines. You may need to avoid medicines that cause your lungs to be dry. Lifestyle Several times a day, wash your hands with soap and water for at least 20 seconds. If you cannot use soap and water, use hand sanitizer. This may help keep you from getting an infection. Avoid being around crowds or people who are sick. Do not smoke or use any products that contain nicotine or tobacco. If you need help quitting, ask your provider. Stay active. Learn how to pace your activity during the day. Learn how to breathe to control your stress and catch your breath. Drink enough fluid to keep your pee (urine) pale yellow, unless you have been told not to. Eat healthy foods. Eat smaller meals more often. Get enough sleep. Most adults need 7 or more hours per night. General instructions Make a COPD action plan with your provider. This helps you to know what to do if you feel worse than usual. Make sure you get all the shots, also called vaccines, that your provider recommends. Ask your provider about a flu shot and a pneumonia shot. If you need home oxygen  therapy, ask your provider how often to check your oxygen  level with a device called an oximeter. Keep all follow-up visits to review your COPD action plan. Your provider will want to check on your condition often to keep  you healthy and out of the hospital. Contact a health care provider if: You are coughing up more mucus than usual. There is a change in the color or thickness of the mucus. It is harder to breathe than usual or you are short of breath while you are resting. You need to use your quick relief inhaler more often. You have trouble doing your normal activities such as getting dressed or walking in  the house. Your skin color or fingernails turn blue. You have a fever or chills. Get help right away if: You are short of breath and cannot: Talk in full sentences. Do normal activities. You have chest pain. You feel confused. These symptoms may be an emergency. Call 911 right away. Do not wait to see if the symptoms will go away. Do not drive yourself to the hospital. This information is not intended to replace advice given to you by your health care provider. Make sure you discuss any questions you have with your health care provider. Document Revised: 02/10/2023 Document Reviewed: 07/26/2022 Elsevier Patient Education  2024 [employer].

## 2024-01-10 NOTE — Telephone Encounter (Signed)
 Sent message to [facility]  for Oxygen order for POC and evalaution

## 2024-01-10 NOTE — Addendum Note (Signed)
 Addended by: Landry Kamath A on: 01/10/2024 03:51 PM   Modules accepted: Orders

## 2024-01-11 ENCOUNTER — Other Ambulatory Visit: Payer: Self-pay

## 2024-01-11 ENCOUNTER — Encounter

## 2024-01-13 ENCOUNTER — Ambulatory Visit
Admission: RE | Admit: 2024-01-13 | Discharge: 2024-01-13 | Disposition: A | Discharge status: Home / Self Care | Source: Ambulatory Visit | Attending: Radiation Oncology | Admitting: Radiation Oncology

## 2024-01-13 DIAGNOSIS — C3411 Malignant neoplasm of upper lobe, right bronchus or lung: Secondary | ICD-10-CM | POA: Insufficient documentation

## 2024-01-13 DIAGNOSIS — Z86711 Personal history of pulmonary embolism: Secondary | ICD-10-CM | POA: Diagnosis not present

## 2024-01-13 DIAGNOSIS — J90 Pleural effusion, not elsewhere classified: Secondary | ICD-10-CM | POA: Insufficient documentation

## 2024-01-13 DIAGNOSIS — C3490 Malignant neoplasm of unspecified part of unspecified bronchus or lung: Secondary | ICD-10-CM | POA: Diagnosis not present

## 2024-01-13 DIAGNOSIS — Z7901 Long term (current) use of anticoagulants: Secondary | ICD-10-CM | POA: Diagnosis not present

## 2024-01-13 LAB — GLUCOSE, CAPILLARY: Glucose-Capillary: 170 mg/dL — ABNORMAL HIGH (ref 70–99)

## 2024-01-16 DIAGNOSIS — Z86711 Personal history of pulmonary embolism: Secondary | ICD-10-CM | POA: Diagnosis not present

## 2024-01-16 DIAGNOSIS — Z7901 Long term (current) use of anticoagulants: Secondary | ICD-10-CM | POA: Diagnosis not present

## 2024-01-19 ENCOUNTER — Encounter: Payer: Self-pay | Admitting: Radiation Oncology

## 2024-01-19 ENCOUNTER — Ambulatory Visit
Admission: RE | Admit: 2024-01-19 | Discharge: 2024-01-19 | Disposition: A | Discharge status: Home / Self Care | Source: Ambulatory Visit | Attending: Radiation Oncology | Admitting: Radiation Oncology

## 2024-01-19 VITALS — BP 108/62 | HR 97

## 2024-01-19 DIAGNOSIS — Z923 Personal history of irradiation: Secondary | ICD-10-CM | POA: Insufficient documentation

## 2024-01-19 DIAGNOSIS — C3411 Malignant neoplasm of upper lobe, right bronchus or lung: Secondary | ICD-10-CM | POA: Insufficient documentation

## 2024-01-19 DIAGNOSIS — Z9221 Personal history of antineoplastic chemotherapy: Secondary | ICD-10-CM | POA: Insufficient documentation

## 2024-01-19 DIAGNOSIS — D649 Anemia, unspecified: Secondary | ICD-10-CM | POA: Insufficient documentation

## 2024-01-19 NOTE — Progress Notes (Signed)
 Radiation Oncology Follow up Note  Name: Kara [name]   Date:   01/19/2024 MRN:  [id number] DOB: 03-Aug-1950    This 73 y.o. female presents to the clinic today for 30-month follow-up status post concurrent chemoradiation therapy for stage IIIb adenocarcinoma the right lung and patient previously treated back in 2022 with SBRT of her right upper lobe.  REFERRING PROVIDER: Sadie Manna, MD  HPI: Patient is a 73 year old female now out 4 months having completed concurrent chemoradiation for at least stage IIIb adenocarcinoma the right lung.  She is seen today in routine follow-up and is doing fairly well she has had problems with recurrent pneumonia recently was hospitalized about 2 weeks prior.  She presented to the ER with worsening shortness of breath and was tachypneic and hypoxic.  She was discharged on Levaquin  and has done better.  She is currently on occasional 2 L of oxygen nasally.  She is not on oxygen today.  She has a mild productive cough.  She is having no dysphagia.  She is currently on Tagrisso  under medical oncology direction.  She is having some anemia.  She had a recent PET CT scan showing progressive masslike architectural distortion fibrosis and volume loss within the right upper lobe and superior segment the right lower lobe compatible with posttreatment changes no evidence of malignancy by PET criteria.  Previous tracer avid subcarinal nodes have decreased in size and hypermetabolic activity.  COMPLICATIONS OF TREATMENT: none  FOLLOW UP COMPLIANCE: keeps appointments   PHYSICAL EXAM:  BP 108/62   Pulse 97   SpO2 95%   PF (!) 0 L/min  Well-developed well-nourished female wheelchair-bound in NAD.  Well-developed well-nourished patient in NAD. HEENT reveals PERLA, EOMI, discs not visualized.  Oral cavity is clear. No oral mucosal lesions are identified. Neck is clear without evidence of cervical or supraclavicular adenopathy. Lungs are clear to A&P. Cardiac  examination is essentially unremarkable with regular rate and rhythm without murmur rub or thrill. Abdomen is benign with no organomegaly or masses noted. Motor sensory and DTR levels are equal and symmetric in the upper and lower extremities. Cranial nerves II through XII are grossly intact. Proprioception is intact. No peripheral adenopathy or edema is identified. No motor or sensory levels are noted. Crude visual fields are within normal range.  RADIOLOGY RESULTS: PET CT scan reviewed compatible with above-stated findings  PLAN: Present time patient is clinically doing well.  Of asked to see her back in 6 months for follow-up.  She continues close follow-up care with medical oncology and and is under treatment with Progresso through Dr. Melanee.  Patient knows to call with any concerns at any time.  I sure she will have repeat imaging by her next follow-up of asked her to forward me any reports from lung studies that are performed.  I would like to take this opportunity to thank you for allowing me to participate in the care of your patient.SABRA Marcey Penton, MD

## 2024-01-20 ENCOUNTER — Other Ambulatory Visit: Payer: Self-pay

## 2024-01-25 ENCOUNTER — Inpatient Hospital Stay (HOSPITAL_BASED_OUTPATIENT_CLINIC_OR_DEPARTMENT_OTHER): Admitting: Oncology

## 2024-01-25 ENCOUNTER — Inpatient Hospital Stay: Attending: Oncology

## 2024-01-25 ENCOUNTER — Telehealth: Payer: Self-pay | Admitting: Pain Medicine

## 2024-01-25 ENCOUNTER — Encounter: Admitting: Internal Medicine

## 2024-01-25 ENCOUNTER — Encounter: Payer: Self-pay | Admitting: Oncology

## 2024-01-25 VITALS — BP 106/56 | HR 104 | Temp 98.6°F | Resp 16 | Wt 176.0 lb

## 2024-01-25 DIAGNOSIS — T50905A Adverse effect of unspecified drugs, medicaments and biological substances, initial encounter: Secondary | ICD-10-CM

## 2024-01-25 DIAGNOSIS — D6959 Other secondary thrombocytopenia: Secondary | ICD-10-CM | POA: Insufficient documentation

## 2024-01-25 DIAGNOSIS — Z79899 Other long term (current) drug therapy: Secondary | ICD-10-CM | POA: Insufficient documentation

## 2024-01-25 DIAGNOSIS — R152 Fecal urgency: Secondary | ICD-10-CM | POA: Diagnosis not present

## 2024-01-25 DIAGNOSIS — M810 Age-related osteoporosis without current pathological fracture: Secondary | ICD-10-CM | POA: Diagnosis not present

## 2024-01-25 DIAGNOSIS — C3490 Malignant neoplasm of unspecified part of unspecified bronchus or lung: Secondary | ICD-10-CM

## 2024-01-25 DIAGNOSIS — Z803 Family history of malignant neoplasm of breast: Secondary | ICD-10-CM | POA: Diagnosis not present

## 2024-01-25 DIAGNOSIS — C3411 Malignant neoplasm of upper lobe, right bronchus or lung: Secondary | ICD-10-CM

## 2024-01-25 LAB — CBC WITH DIFFERENTIAL (CANCER CENTER ONLY)
Abs Immature Granulocytes: 0.01 K/uL (ref 0.00–0.07)
Basophils Absolute: 0 K/uL (ref 0.0–0.1)
Basophils Relative: 0 %
Eosinophils Absolute: 0.1 K/uL (ref 0.0–0.5)
Eosinophils Relative: 2 %
HCT: 35.6 % — ABNORMAL LOW (ref 36.0–46.0)
Hemoglobin: 11.3 g/dL — ABNORMAL LOW (ref 12.0–15.0)
Immature Granulocytes: 0 %
Lymphocytes Relative: 17 %
Lymphs Abs: 0.6 K/uL — ABNORMAL LOW (ref 0.7–4.0)
MCH: 29.8 pg (ref 26.0–34.0)
MCHC: 31.7 g/dL (ref 30.0–36.0)
MCV: 93.9 fL (ref 80.0–100.0)
Monocytes Absolute: 0.4 K/uL (ref 0.1–1.0)
Monocytes Relative: 11 %
Neutro Abs: 2.5 K/uL (ref 1.7–7.7)
Neutrophils Relative %: 70 %
Platelet Count: 78 K/uL — ABNORMAL LOW (ref 150–400)
RBC: 3.79 MIL/uL — ABNORMAL LOW (ref 3.87–5.11)
RDW: 14.3 % (ref 11.5–15.5)
WBC Count: 3.6 K/uL — ABNORMAL LOW (ref 4.0–10.5)
nRBC: 0 % (ref 0.0–0.2)

## 2024-01-25 LAB — CMP (CANCER CENTER ONLY)
ALT: 8 U/L (ref 0–44)
AST: 20 U/L (ref 15–41)
Albumin: 3.3 g/dL — ABNORMAL LOW (ref 3.5–5.0)
Alkaline Phosphatase: 39 U/L (ref 38–126)
Anion gap: 8 (ref 5–15)
BUN: 9 mg/dL (ref 8–23)
CO2: 26 mmol/L (ref 22–32)
Calcium: 9 mg/dL (ref 8.9–10.3)
Chloride: 102 mmol/L (ref 98–111)
Creatinine: 0.85 mg/dL (ref 0.44–1.00)
GFR, Estimated: >60 mL/min (ref >60)
Glucose, Bld: 135 mg/dL — ABNORMAL HIGH (ref 70–99)
Potassium: 3.6 mmol/L (ref 3.5–5.1)
Sodium: 136 mmol/L (ref 135–145)
Total Bilirubin: 0.4 mg/dL (ref 0.0–1.2)
Total Protein: 7.2 g/dL (ref 6.5–8.1)

## 2024-01-25 NOTE — Telephone Encounter (Signed)
 Informed patient that she does not need an evaluation appt first because this procedure has already been ordered.  She may come on 02-21-24 for procedure as long as she is not sick.

## 2024-01-25 NOTE — Telephone Encounter (Signed)
 PT waned to RS appt that she was schedule for on 8/5 due to her being sick. PT stated that she doing well. Just wanted to make sure it's okay. I have schedule patient for 02-21-24 at 1130 am. Please give patient a call. TY

## 2024-01-28 DIAGNOSIS — J9611 Chronic respiratory failure with hypoxia: Secondary | ICD-10-CM | POA: Diagnosis not present

## 2024-01-28 DIAGNOSIS — I509 Heart failure, unspecified: Secondary | ICD-10-CM | POA: Diagnosis not present

## 2024-01-29 ENCOUNTER — Encounter: Payer: Self-pay | Admitting: Internal Medicine

## 2024-01-29 ENCOUNTER — Encounter: Payer: Self-pay | Admitting: Oncology

## 2024-01-29 NOTE — Progress Notes (Signed)
 Hematology/Oncology Consult note [facility]  [phone] [phone]  Patient Care Team: Sadie Manna, MD as PCP - General (Internal Medicine) Verdene Gills, RN as Oncology Nurse Navigator Melanee Annah BROCKS, MD as Consulting Physician (Oncology)   Name of the patient: Kara [name]  [id number]  08-03-1950   Date of visit: 01/29/24  Diagnosis- right upper lobe lung cancer s/p SBRT now with mediastinal recurrence   Chief complaint/ Reason for visit-routine follow-up of lung cancer presently on Tagrisso   Heme/Onc history: Patient is a 73 year old female who underwent SBRT for presumed right upper lobe lung cancer in December 2022. She was in remission until November 2024 when she was noted to have hypermetabolic in the right hilum as well as mediastinal region. 15 x 12 mm nodular opacity in the dome of the right diaphragm. 10 mm left cervical lymph node. 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 Right pleural thoracentesis negative for malignancy.  Patient completed concurrent chemoradiation with weekly CarboTaxol in March 2025.  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  MSI not high.  CT DNA tumor fraction 7%   CT scans after concurrent chemoradiation showed stable disease with stable mediastinal adenopathy asWell as postradiation changes noted in the perihilar right lung.  She he is on adjuvant Tagrisso  starting May 2025    Interval history-patient is tolerating Tagrisso  well without any skin rash or diarrhea.  She does not report missing any doses.  She has baseline fatigue and exertional shortness of breath.She was discharged from the hospital in July 2025 for another bout of pneumonia.  ECOG PS- 2 Pain scale- 0   Review of systems- Review of Systems   Constitutional:  Positive for malaise/fatigue. Negative for chills, fever and weight loss.  HENT:  Negative for congestion, ear discharge and nosebleeds.   Eyes:  Negative for blurred vision.  Respiratory:  Positive for shortness of breath. Negative for cough, hemoptysis, sputum production and wheezing.   Cardiovascular:  Negative for chest pain, palpitations, orthopnea and claudication.  Gastrointestinal:  Negative for abdominal pain, blood in stool, constipation, diarrhea, heartburn, melena, nausea and vomiting.  Genitourinary:  Negative for dysuria, flank pain, frequency, hematuria and urgency.  Musculoskeletal:  Negative for back pain, joint pain and myalgias.  Skin:  Negative for rash.  Neurological:  Negative for dizziness, tingling, focal weakness, seizures, weakness and headaches.  Endo/Heme/Allergies:  Does not bruise/bleed easily.  Psychiatric/Behavioral:  Negative for depression and suicidal ideas. The patient does not have insomnia.       Allergies  Allergen Reactions   Adhesive [Tape] Other (See Comments)    Pt reports, when removing tape from skin most tapes pull her skin off. Ok to use paper tape   Avapro  [Irbesartan ] Anaphylaxis    Facial swelling   Morphine  And Codeine  Nausea And Vomiting   Other Hives    Chlorhexadine wipes/CHG wipes,    Latex Rash    Exam gloves/ does not react around elastic and lips don't swell when blowing balloons     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, 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   CHF (congestive heart failure) (HCC)    followed colon resection, wouldn't let me get up   Chronic anticoagulation  Degenerative disc disease, lumbar    Diverticulitis 2018   Diverticulosis    DVT of lower extremity, bilateral (HCC) 2018   Essential hypertension    History of being hospitalized    1 month ago for 3 days, vomiting, and kink in upper intestine   History of hiatal  hernia    Mediastinal adenopathy 04/2023   MRSA nasal colonization    Non-small cell lung cancer (HCC) 2022   right   Obesity (BMI 30-39.9)    Osteoarthritis    Osteoporosis    PE (pulmonary thromboembolism) (HCC) 2018   Pneumonia    Small bowel obstruction (HCC) 03/20/2023   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HCC)    Type 2 diabetes mellitus with obesity (HCC)    Vertigo      Past Surgical History:  Procedure Laterality Date   APPENDECTOMY N/A 02/24/2017   Procedure: Incidental  APPENDECTOMY;  Surgeon: Shelva Dunnings, MD;  Location: [name];  Service: General;  Laterality: N/A;   CARPAL TUNNEL RELEASE Bilateral    CATARACT EXTRACTION W/PHACO Left 11/21/2018   Procedure: CATARACT EXTRACTION PHACO AND INTRAOCULAR LENS PLACEMENT (IOC) LEFT DIABETES;  Surgeon: Jaye Fallow, MD;  Location: [facility];  Service: Ophthalmology;  Laterality: Left;  latex sensitivity Diabetic - oral meds   CATARACT EXTRACTION W/PHACO Right 12/12/2018   Procedure: CATARACT EXTRACTION PHACO AND INTRAOCULAR LENS PLACEMENT (IOC) RIGHT DIABETES;  Surgeon: Jaye Fallow, MD;  Location: [facility];  Service: Ophthalmology;  Laterality: Right;  Diabetes-oral med Latex sensitiviy   COLON RESECTION SIGMOID N/A 11/02/2016   Procedure: COLON RESECTION SIGMOID;  Surgeon: Shelva Dunnings, MD;  Location: [name];  Service: General;  Laterality: N/A;   COLON SURGERY  11/02/2016   COLONOSCOPY WITH PROPOFOL  N/A 02/03/2017   Procedure: COLONOSCOPY WITH PROPOFOL ;  Surgeon: Unk Corinn Skiff, MD;  Location: [name];  Service: Gastroenterology;  Laterality: N/A;   COLOSTOMY N/A 11/02/2016   Procedure: COLOSTOMY;  Surgeon: Shelva Dunnings, MD;  Location: [name];  Service: General;  Laterality: N/A;   COLOSTOMY REVERSAL N/A 02/24/2017   Procedure: COLOSTOMY REVERSAL, ostomy takedown, spleenic flexure mobilization, excision rectal stump/distal sigmoid, anastomosis with suture  reinforcement;  Surgeon: Shelva Dunnings, MD;  Location: [name];  Service: General;  Laterality: N/A;   ENDOBRONCHIAL ULTRASOUND N/A 05/16/2023   Procedure: ENDOBRONCHIAL ULTRASOUND;  Surgeon: Tamea Dedra CROME, MD;  Location: [name];  Service: Cardiopulmonary;  Laterality: N/A;   FLEXIBLE BRONCHOSCOPY N/A 05/16/2023   Procedure: FLEXIBLE BRONCHOSCOPY;  Surgeon: Tamea Dedra CROME, MD;  Location: [name];  Service: Cardiopulmonary;  Laterality: N/A;   ILEO LOOP DIVERSION N/A 02/24/2017   Procedure: ILEO LOOP COLOSTOMY;  Surgeon: Shelva Dunnings, MD;  Location: [name];  Service: General;  Laterality: N/A;   ILEOSTOMY CLOSURE N/A 06/01/2017   Procedure: LOOP ILEOSTOMY TAKEDOWN;  Surgeon: Shelva Dunnings, MD;  Location: [name];  Service: General;  Laterality: N/A;   IR IMAGING GUIDED PORT INSERTION  06/14/2023   IR REMOVAL TUN ACCESS W/ PORT W/O FL MOD SED  11/16/2023   IVC FILTER INSERTION Right 10/2016   IVC FILTER REMOVAL N/A 08/09/2017   Procedure: IVC FILTER REMOVAL;  Surgeon: Jama Cordella MATSU, MD;  Location: [name];  Service: Cardiovascular;  Laterality: N/A;   KNEE SURGERY Right    torn menicus   LAPAROSCOPY N/A 02/24/2017   Procedure: LAPAROSCOPY DIAGNOSTIC;  Surgeon: Shelva Dunnings, MD;  Location: [name];  Service: General;  Laterality: N/A;   LYSIS OF ADHESION N/A 02/24/2017  Procedure: LYSIS OF ADHESION;  Surgeon: Shelva Dunnings, MD;  Location: [name];  Service: General;  Laterality: N/A;   PULMONARY VENOGRAPHY N/A 11/19/2016   Procedure: Pulmonary Venography; IVC filter placement; possible pulmonary thrombectomy;  Surgeon: Jama Cordella MATSU, MD;  Location: [name];  Service: Cardiovascular;  Laterality: N/A;   SACROPLASTY N/A 11/08/2017   Procedure: SACROPLASTY S2;  Surgeon: Kathlynn Sharper, MD;  Location: [name];  Service: Orthopedics;  Laterality: N/A;   SIGMOIDOSCOPY N/A 11/13/2016   Procedure: endoscopic  flexible SIGMOIDOSCOPY;   Surgeon: Wonda Charlie BRAVO, MD;  Location: [name];  Service: General;  Laterality: N/A;   SIGMOIDOSCOPY N/A 05/19/2017   Procedure: ENID MORIN;  Surgeon: Shelva Dunnings, MD;  Location: [name];  Service: General;  Laterality: N/A;   VIDEO BRONCHOSCOPY WITH ENDOBRONCHIAL NAVIGATION N/A 02/23/2021   Procedure: ROBOTIC VIDEO BRONCHOSCOPY WITH ENDOBRONCHIAL NAVIGATION;  Surgeon: Tamea Dedra CROME, MD;  Location: [name];  Service: Pulmonary;  Laterality: N/A;    Social History   Socioeconomic History   Marital status: Married    Spouse name: Butler   Number of children: 1   Years of education: Not on file   Highest education level: Not on file  Occupational History   Not on file  Tobacco Use   Smoking status: Never    Passive exposure: Current (husband smokes)   Smokeless tobacco: Never  Vaping Use   Vaping status: Never Used  Substance and Sexual Activity   Alcohol  use: No    Alcohol /week: 0.0 standard drinks of alcohol    Drug use: No   Sexual activity: Never  Other Topics Concern   Not on file  Social History Narrative   Not on file   Social Drivers of Health   Financial Resource Strain: Low Risk  (05/19/2023)   Received from [facility]   Overall Financial Resource Strain (CARDIA)    Difficulty of Paying Living Expenses: Not hard at all  Food Insecurity: No Food Insecurity (12/12/2023)   Hunger Vital Sign    Worried About Running Out of Food in the Last Year: Never true    Ran Out of Food in the Last Year: Never true  Transportation Needs: No Transportation Needs (12/12/2023)   PRAPARE - Administrator, Civil Service (Medical): No    Lack of Transportation (Non-Medical): No  Physical Activity: Not on file  Stress: Not on file  Social Connections: Moderately Isolated (12/12/2023)   Social Connection and Isolation Panel    Frequency of Communication with Friends and Family: More than three times a week    Frequency of  Social Gatherings with Friends and Family: Once a week    Attends Religious Services: Never    Database administrator or Organizations: No    Attends Banker Meetings: Never    Marital Status: Married  Catering manager Violence: Not At Risk (12/12/2023)   Humiliation, Afraid, Rape, and Kick questionnaire    Fear of Current or Ex-Partner: No    Emotionally Abused: No    Physically Abused: No    Sexually Abused: No    Family History  Problem Relation Age of Onset   Diabetes Mother    Heart disease Mother    Cancer Mother    Breast cancer Mother        >50   Heart disease Father    Diabetes Sister    Heart disease Sister      Current Outpatient Medications:  albuterol  (VENTOLIN  HFA) 108 (90 Base) MCG/ACT inhaler, Inhale 2 puffs into the lungs every 6 (six) hours as needed for wheezing or shortness of breath., Disp: 18 g, Rfl: 3   bisacodyl  (DULCOLAX) 10 MG suppository, Place 1 suppository (10 mg total) rectally as needed for moderate constipation., Disp: 12 suppository, Rfl: 0   budesonide -formoterol  (SYMBICORT ) 80-4.5 MCG/ACT inhaler, Inhale 2 puffs into the lungs 2 (two) times daily., Disp: 10.2 g, Rfl: 3   candesartan (ATACAND) 16 MG tablet, Take 16 mg by mouth in the morning., Disp: , Rfl:    cholecalciferol  (VITAMIN D ) 25 MCG (1000 UNIT) tablet, Take 1,000 Units by mouth in the morning., Disp: , Rfl:    Cyanocobalamin  (VITAMIN B-12) 5000 MCG TBDP, Take 5,000 mcg by mouth in the morning., Disp: , Rfl:    cyclobenzaprine  (FLEXERIL ) 10 MG tablet, Take 10 mg by mouth at bedtime., Disp: , Rfl:    Dulaglutide (TRULICITY) 1.5 MG/0.5ML SOAJ, Inject 1.5 mg into the skin once a week. Saturday, Disp: , Rfl:    gabapentin  (NEURONTIN ) 100 MG capsule, Take 200 mg by mouth with breakfast, with lunch, and with evening meal., Disp: , Rfl:    gabapentin  (NEURONTIN ) 300 MG capsule, Take 300 mg by mouth at bedtime., Disp: , Rfl:    glipiZIDE  (GLUCOTROL ) 10 MG tablet, Take 10 mg by  mouth daily before breakfast., Disp: , Rfl:    Magnesium  Oxide -Mg Supplement 500 MG TABS, Take 1 tablet by mouth at bedtime., Disp: , Rfl:    omeprazole  (PRILOSEC) 20 MG capsule, Take 1 capsule (20 mg total) by mouth daily., Disp: 90 capsule, Rfl: 0   ondansetron  (ZOFRAN ) 8 MG tablet, TAKE 1 TABLET BY MOUTH EVERY 8 HOURS AS NEEDED FOR NAUSEA OR VOMITING. START ON THE 3RD DAY AFTER CHEMOTHERAPY, Disp: 30 tablet, Rfl: 1   ONETOUCH ULTRA TEST test strip, , Disp: , Rfl:    osimertinib  mesylate (TAGRISSO ) 80 MG tablet, Take 1 tablet (80 mg total) by mouth daily., Disp: 30 tablet, Rfl: 2   oxybutynin  (DITROPAN ) 5 MG tablet, Take 1 tablet by mouth 2 (two) times daily., Disp: , Rfl:    OXYGEN, Inhale 2 L into the lungs., Disp: , Rfl:    prochlorperazine  (COMPAZINE ) 10 MG tablet, Take 1 tablet (10 mg total) by mouth every 6 (six) hours as needed for nausea or vomiting., Disp: 90 tablet, Rfl: 0   rOPINIRole  (REQUIP ) 1 MG tablet, Take 1 mg by mouth at bedtime., Disp: , Rfl:    sucralfate  (CARAFATE ) 1 g tablet, Take 1 tablet (1 g total) by mouth 3 (three) times daily with meals. Dissolve tablet in 4 tablesppons warm water swish and swallow 30 min before meals. (Patient taking differently: Take 1 g by mouth 3 (three) times daily with meals. Dissolve tablet in 4 tablespoons warm water swish and swallow 30 min before meals.), Disp: 90 tablet, Rfl: 0   warfarin (COUMADIN ) 5 MG tablet, Take 5 mg by mouth daily., Disp: , Rfl:   Physical exam:  Vitals:   01/25/24 1514  BP: (!) 106/56  Pulse: (!) 104  Resp: 16  Temp: 98.6 F (37 C)  TempSrc: Tympanic  SpO2: 95%  Weight: 176 lb (79.8 kg)   Physical Exam Cardiovascular:     Rate and Rhythm: Normal rate and regular rhythm.     Heart sounds: Normal heart sounds.  Pulmonary:     Effort: Pulmonary effort is normal.     Breath sounds: Normal breath sounds.  Abdominal:  General: Bowel sounds are normal.     Palpations: Abdomen is soft.  Skin:    General:  Skin is warm and dry.  Neurological:     Mental Status: She is alert and oriented to person, place, and time.      I have personally reviewed labs listed below:    Latest Ref Rng & Units 01/25/2024    2:59 PM  CMP  Glucose 70 - 99 mg/dL 864   BUN 8 - 23 mg/dL 9   Creatinine 9.55 - 8.99 mg/dL 9.14   Sodium 864 - 854 mmol/L 136   Potassium 3.5 - 5.1 mmol/L 3.6   Chloride 98 - 111 mmol/L 102   CO2 22 - 32 mmol/L 26   Calcium  8.9 - 10.3 mg/dL 9.0   Total Protein 6.5 - 8.1 g/dL 7.2   Total Bilirubin 0.0 - 1.2 mg/dL 0.4   Alkaline Phos 38 - 126 U/L 39   AST 15 - 41 U/L 20   ALT 0 - 44 U/L 8       Latest Ref Rng & Units 01/25/2024    2:59 PM  CBC  WBC 4.0 - 10.5 K/uL 3.6   Hemoglobin 12.0 - 15.0 g/dL 88.6   Hematocrit 63.9 - 46.0 % 35.6   Platelets 150 - 400 K/uL 78   NM PET Image Restag (PS) Skull Base To Thigh Result Date: 01/19/2024 EXAM: PET AND CT SKULL BASE TO MID THIGH 01/13/2024 02:58:14 PM TECHNIQUE: None RADIOPHARMACEUTICAL: 9.96 mCi F-18 FDG Uptake time 60 minutes. Glucose level 170 mg/dl. PET imaging was acquired from the base of the skull to the mid thighs. Non-contrast enhanced computed tomography was obtained for attenuation correction and anatomic localization. COMPARISON: CT chest, abdomen and pelvis from 10/06/2023 and PET/CT from 06/03/2023. CLINICAL HISTORY: Non-small cell lung cancer (NSCLC), metastatic, assess treatment response. 9.68mCi FDG GIVEN VIA RT FOREARM IV; BG= 170mg /dL; PT HAS HAD PERSISTENT COUGH FOR SEVERAL WEEKS; PT RECENTLY HOSPITALIZED FOR SEPTIC PNEUMONIA; MALIGNANT NEOPLASM OF RIGHT UPPER LOBE OF LUNG; NON-SMALL CELL LUNG CANCER; METASTATIC; ASSESS TREATMENT RESPONSE FINDINGS: HEAD AND NECK: No metabolically active cervical lymphadenopathy. CHEST: There is a small right pleural effusion identified. Fibrosis, ground-glass attenuation and architectural distortion in the left upper lobe and left lower lobe are also noted and are slightly progressive compared  with the previous study. Mass-like architectural distortion, fibrosis and volume loss within the right upper lobe and superior segment of right lower lobe compatible with progressive posttreatment change. Mild diffuse increased uptake throughout the areas of mass-like architectural distortion in the right lung have an suv max of 4.0 favoring post-treatment inflammatory change. . On the previous PET CT there was mass-like architectural distortion centered around the right hilar region with SUV max of 9.1. No discrete tracer avid lung nodule or mass identified. Previous index subcarinal lymph node measures 0.7 cm with SUV max of 3.9, axial image 53. On the previous exam, this measured 11 mm with suv max of 9.0. ABDOMEN AND PELVIS: No abnormal tracer uptake within the liver, pancreas, spleen or adrenal glands. No tracer avid abdominal pelvic lymph nodes. Midline ventral abdominal wall hernia contains loop of transverse colon without signs of obstruction. Multiple gallstones. BONES AND SOFT TISSUE: No abnormal FDG activity localizes to the bones. No metabolically active aggressive osseous lesion. VASCULATURE: Aortic atherosclerosis. Coronary artery calcifications. IMPRESSION: 1. Progression of mass-like architectural distortion, fibrosis, and volume loss within the right upper lobe and superior segment of the right lower lobe, compatible with posttreatment  change. No highly suspicious discrete tracer avid lung nodule or mass identified specific for local recurrent tumor or metastatic disease. . 2. The previously tracer-avid subcarinal lymph node has decreased in size and degree of uptake compared with the previous exam. 3. No sites of distant metastatic disease. 4. Small right pleural effusion. Electronically signed by: Waddell Calk MD 01/19/2024 08:23 AM EDT RP Workstation: [id number]     Assessment and plan- Patient is a 73 y.o. female with history of stage I right upper lobe lung cancer s/p SBRT in the past. She  had biopsy-proven mediastinal recurrence in December 2024. She is s/p concurrent chemoradiation and presently on adjuvant Tagrisso .  She is here for routine follow-up  Patient's platelet count has been gradually trending down ever since She started her on Tagrisso .  Today her platelet counts are 78.  I will continue with 80 mg of Tagrisso  daily unless platelet counts drop down to less than 50.  White count and hemoglobin are normal.  Patient had a PET scan as ordered by radiation oncology in August 2025 which showed masslike distortion/volume loss and fibrosis in the upper lobe and superior segment of the right lower lobe compatible with radiation changes.  No evidence of local tumor recurrence.  Overall she is tolerating Tagrisso  well.  CBC with differential and CMP in 6 weeks and 12 weeks and I will see her back in 12 weeks   Visit Diagnosis 1. Cancer of upper lobe of right lung (HCC)   2. High risk medication use   3. Drug-induced thrombocytopenia      Dr. Annah Skene, MD, MPH [facility] at [facility] [phone] 01/29/2024 9:25 PM

## 2024-01-30 ENCOUNTER — Telehealth: Payer: Self-pay

## 2024-01-30 ENCOUNTER — Other Ambulatory Visit: Payer: Self-pay

## 2024-01-30 ENCOUNTER — Encounter: Payer: Self-pay | Admitting: Nurse Practitioner

## 2024-01-30 ENCOUNTER — Inpatient Hospital Stay (HOSPITAL_BASED_OUTPATIENT_CLINIC_OR_DEPARTMENT_OTHER): Admitting: Nurse Practitioner

## 2024-01-30 ENCOUNTER — Inpatient Hospital Stay

## 2024-01-30 ENCOUNTER — Other Ambulatory Visit: Payer: Self-pay | Admitting: Oncology

## 2024-01-30 ENCOUNTER — Other Ambulatory Visit: Payer: Self-pay | Admitting: *Deleted

## 2024-01-30 VITALS — BP 96/51 | HR 109 | Resp 16 | Wt 173.1 lb

## 2024-01-30 DIAGNOSIS — R152 Fecal urgency: Secondary | ICD-10-CM

## 2024-01-30 DIAGNOSIS — R197 Diarrhea, unspecified: Secondary | ICD-10-CM

## 2024-01-30 DIAGNOSIS — T50905A Adverse effect of unspecified drugs, medicaments and biological substances, initial encounter: Secondary | ICD-10-CM

## 2024-01-30 DIAGNOSIS — Z79899 Other long term (current) drug therapy: Secondary | ICD-10-CM

## 2024-01-30 DIAGNOSIS — C3411 Malignant neoplasm of upper lobe, right bronchus or lung: Secondary | ICD-10-CM | POA: Diagnosis not present

## 2024-01-30 LAB — CBC WITH DIFFERENTIAL (CANCER CENTER ONLY)
Abs Immature Granulocytes: 0.02 K/uL (ref 0.00–0.07)
Basophils Absolute: 0 K/uL (ref 0.0–0.1)
Basophils Relative: 0 %
Eosinophils Absolute: 0.1 K/uL (ref 0.0–0.5)
Eosinophils Relative: 2 %
HCT: 36 % (ref 36.0–46.0)
Hemoglobin: 11.5 g/dL — ABNORMAL LOW (ref 12.0–15.0)
Immature Granulocytes: 1 %
Lymphocytes Relative: 16 %
Lymphs Abs: 0.7 K/uL (ref 0.7–4.0)
MCH: 30 pg (ref 26.0–34.0)
MCHC: 31.9 g/dL (ref 30.0–36.0)
MCV: 94 fL (ref 80.0–100.0)
Monocytes Absolute: 0.5 K/uL (ref 0.1–1.0)
Monocytes Relative: 12 %
Neutro Abs: 2.9 K/uL (ref 1.7–7.7)
Neutrophils Relative %: 69 %
Platelet Count: 94 K/uL — ABNORMAL LOW (ref 150–400)
RBC: 3.83 MIL/uL — ABNORMAL LOW (ref 3.87–5.11)
RDW: 14.3 % (ref 11.5–15.5)
WBC Count: 4.2 K/uL (ref 4.0–10.5)
nRBC: 0 % (ref 0.0–0.2)

## 2024-01-30 LAB — BASIC METABOLIC PANEL - CANCER CENTER ONLY
Anion gap: 9 (ref 5–15)
BUN: 9 mg/dL (ref 8–23)
CO2: 24 mmol/L (ref 22–32)
Calcium: 8.8 mg/dL — ABNORMAL LOW (ref 8.9–10.3)
Chloride: 102 mmol/L (ref 98–111)
Creatinine: 0.86 mg/dL (ref 0.44–1.00)
GFR, Estimated: >60 mL/min (ref >60)
Glucose, Bld: 174 mg/dL — ABNORMAL HIGH (ref 70–99)
Potassium: 3.8 mmol/L (ref 3.5–5.1)
Sodium: 135 mmol/L (ref 135–145)

## 2024-01-30 NOTE — Telephone Encounter (Signed)
 Called patient after receiving message from Radiation that she feels as if she is having an allergic reaction to Terezo.   Patient states she has been experiencing loose stools over the weekend and hasn't been able to leave home due to being scared she'll have an accident. She states she has been pushing fluids to make sure she stays hydrated but currently holding off on Terezo. She has not taken the medication for the last two days.  Spoke to Dr. Melanee lovely suggested she come in to be seen by [facility] with labs.   Patient verbalized agreement to come in to be seen by [facility] and stated is able to come at 3:00pm today.

## 2024-01-30 NOTE — Progress Notes (Signed)
 Patient states diarrhea started on Thursday and she believes it it came from the Tagrisso  medication. Patient states she has had a couple incidents where she didn't make it to the bathroom.

## 2024-01-30 NOTE — Progress Notes (Signed)
 Symptom Management Clinic  [facility] Department of the [place]. [facility] [address] [place], [place] [address] [phone]) [phone] (fax)  Patient Care Team: Sadie Manna, MD as PCP - General (Internal Medicine) Verdene Gills, RN as Oncology Nurse Navigator Melanee Annah BROCKS, MD as Consulting Physician (Oncology)   Name of the patient: Kara [name]  [id number]  06-24-1950   Date of visit: 01/30/24  Diagnosis- Recurrent Lung Cancer  Chief complaint/ Reason for visit- fecal urgency  Heme/Onc history:  Oncology History  Cancer of upper lobe of right lung (HCC)  05/27/2023 Initial Diagnosis   Cancer of upper lobe of right lung (HCC)   07/01/2023 -  Chemotherapy   Patient is on Treatment Plan : LUNG Carboplatin  + Paclitaxel  + XRT q7d       Interval history- Kara [name] is a 73 y.o. female who presents to symptom management clinic for complaints of diarrhea. She has been taking tagrisso  for approximately 1 month and last week she experienced fecal urgency. Stools are formed but soft and coil in toilet. She held tagrisso  x 2 days and has not had any additional episodes. Stool was dark one day then green another. Not watery. She denies abdominal pain, nausea. No fevers.   Review of systems- Review of Systems  Constitutional:  Negative for chills, fever, malaise/fatigue and weight loss.  Gastrointestinal:  Positive for diarrhea. Negative for abdominal pain, blood in stool, constipation, melena, nausea and vomiting.  Genitourinary:  Negative for dysuria and urgency.  Musculoskeletal:  Negative for falls and myalgias.  Skin:  Negative for itching and rash.  Neurological:  Negative for dizziness.  Endo/Heme/Allergies:  Negative for environmental allergies. Does not bruise/bleed easily.  Psychiatric/Behavioral:  Negative for depression. The patient is not nervous/anxious and does not have insomnia.      Allergies  Allergen Reactions   Adhesive [Tape] Other (See Comments)    Pt reports, when removing tape from skin most tapes pull her skin off. 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, 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   CHF (congestive heart failure) (HCC)    followed colon resection, wouldn't let me get up   Chronic anticoagulation    Degenerative disc disease, lumbar    Diverticulitis 2018   Diverticulosis    DVT of lower extremity, bilateral (HCC) 2018   Essential hypertension    History of being hospitalized    1 month ago for 3 days, vomiting, and kink in upper intestine   History of hiatal hernia    Mediastinal adenopathy 04/2023   MRSA nasal colonization    Non-small cell lung cancer (HCC) 2022   right   Obesity (BMI 30-39.9)    Osteoarthritis    Osteoporosis    PE (pulmonary thromboembolism) (HCC) 2018   Pneumonia    Small bowel obstruction (HCC) 03/20/2023   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HCC)    Type 2 diabetes mellitus with obesity (HCC)    Vertigo     Past Surgical History:  Procedure Laterality  Date   APPENDECTOMY N/A 02/24/2017   Procedure: Incidental  APPENDECTOMY;  Surgeon: Shelva Dunnings, MD;  Location: [name];  Service: General;  Laterality: N/A;   CARPAL TUNNEL RELEASE Bilateral    CATARACT EXTRACTION W/PHACO Left 11/21/2018   Procedure: CATARACT EXTRACTION PHACO AND INTRAOCULAR LENS PLACEMENT (IOC) LEFT DIABETES;  Surgeon: Jaye Fallow, MD;  Location: [facility];  Service: Ophthalmology;  Laterality: Left;  latex sensitivity Diabetic - oral meds   CATARACT EXTRACTION W/PHACO Right 12/12/2018    Procedure: CATARACT EXTRACTION PHACO AND INTRAOCULAR LENS PLACEMENT (IOC) RIGHT DIABETES;  Surgeon: Jaye Fallow, MD;  Location: [facility];  Service: Ophthalmology;  Laterality: Right;  Diabetes-oral med Latex sensitiviy   COLON RESECTION SIGMOID N/A 11/02/2016   Procedure: COLON RESECTION SIGMOID;  Surgeon: Shelva Dunnings, MD;  Location: [name];  Service: General;  Laterality: N/A;   COLON SURGERY  11/02/2016   COLONOSCOPY WITH PROPOFOL  N/A 02/03/2017   Procedure: COLONOSCOPY WITH PROPOFOL ;  Surgeon: Unk Corinn Skiff, MD;  Location: [facility];  Service: Gastroenterology;  Laterality: N/A;   COLOSTOMY N/A 11/02/2016   Procedure: COLOSTOMY;  Surgeon: Shelva Dunnings, MD;  Location: [name];  Service: General;  Laterality: N/A;   COLOSTOMY REVERSAL N/A 02/24/2017   Procedure: COLOSTOMY REVERSAL, ostomy takedown, spleenic flexure mobilization, excision rectal stump/distal sigmoid, anastomosis with suture reinforcement;  Surgeon: Shelva Dunnings, MD;  Location: [name];  Service: General;  Laterality: N/A;   ENDOBRONCHIAL ULTRASOUND N/A 05/16/2023   Procedure: ENDOBRONCHIAL ULTRASOUND;  Surgeon: Tamea Dedra CROME, MD;  Location: [name];  Service: Cardiopulmonary;  Laterality: N/A;   FLEXIBLE BRONCHOSCOPY N/A 05/16/2023   Procedure: FLEXIBLE BRONCHOSCOPY;  Surgeon: Tamea Dedra CROME, MD;  Location: [name];  Service: Cardiopulmonary;  Laterality: N/A;   ILEO LOOP DIVERSION N/A 02/24/2017   Procedure: ILEO LOOP COLOSTOMY;  Surgeon: Shelva Dunnings, MD;  Location: [name];  Service: General;  Laterality: N/A;   ILEOSTOMY CLOSURE N/A 06/01/2017   Procedure: LOOP ILEOSTOMY TAKEDOWN;  Surgeon: Shelva Dunnings, MD;  Location: [name];  Service: General;  Laterality: N/A;   IR IMAGING GUIDED PORT INSERTION  06/14/2023   IR REMOVAL TUN ACCESS W/ PORT W/O FL MOD SED  11/16/2023   IVC FILTER INSERTION Right 10/2016   IVC FILTER REMOVAL N/A 08/09/2017   Procedure: IVC  FILTER REMOVAL;  Surgeon: Jama Cordella MATSU, MD;  Location: [name];  Service: Cardiovascular;  Laterality: N/A;   KNEE SURGERY Right    torn menicus   LAPAROSCOPY N/A 02/24/2017   Procedure: LAPAROSCOPY DIAGNOSTIC;  Surgeon: Shelva Dunnings, MD;  Location: [name];  Service: General;  Laterality: N/A;   LYSIS OF ADHESION N/A 02/24/2017   Procedure: LYSIS OF ADHESION;  Surgeon: Shelva Dunnings, MD;  Location: [name];  Service: General;  Laterality: N/A;   PULMONARY VENOGRAPHY N/A 11/19/2016   Procedure: Pulmonary Venography; IVC filter placement; possible pulmonary thrombectomy;  Surgeon: Jama Cordella MATSU, MD;  Location: [name];  Service: Cardiovascular;  Laterality: N/A;   SACROPLASTY N/A 11/08/2017   Procedure: SACROPLASTY S2;  Surgeon: Kathlynn Sharper, MD;  Location: [name];  Service: Orthopedics;  Laterality: N/A;   SIGMOIDOSCOPY N/A 11/13/2016   Procedure: endoscopic  flexible SIGMOIDOSCOPY;  Surgeon: Wonda Charlie BRAVO, MD;  Location: [name];  Service: General;  Laterality: N/A;   SIGMOIDOSCOPY N/A 05/19/2017   Procedure: ENID MORIN;  Surgeon: Shelva Dunnings, MD;  Location: [name];  Service: General;  Laterality: N/A;   VIDEO BRONCHOSCOPY WITH ENDOBRONCHIAL NAVIGATION N/A  02/23/2021   Procedure: ROBOTIC VIDEO BRONCHOSCOPY WITH ENDOBRONCHIAL NAVIGATION;  Surgeon: Tamea Dedra CROME, MD;  Location: [name];  Service: Pulmonary;  Laterality: N/A;    Social History   Socioeconomic History   Marital status: Married    Spouse name: Butler   Number of children: 1   Years of education: Not on file   Highest education level: Not on file  Occupational History   Not on file  Tobacco Use   Smoking status: Never    Passive exposure: Current (husband smokes)   Smokeless tobacco: Never  Vaping Use   Vaping status: Never Used  Substance and Sexual Activity   Alcohol  use: No    Alcohol /week: 0.0 standard drinks of alcohol    Drug use: No    Sexual activity: Never  Other Topics Concern   Not on file  Social History Narrative   Not on file   Social Drivers of Health   Financial Resource Strain: Low Risk  (05/19/2023)   Received from [facility]   Overall Financial Resource Strain (CARDIA)    Difficulty of Paying Living Expenses: Not hard at all  Food Insecurity: No Food Insecurity (12/12/2023)   Hunger Vital Sign    Worried About Running Out of Food in the Last Year: Never true    Ran Out of Food in the Last Year: Never true  Transportation Needs: No Transportation Needs (12/12/2023)   PRAPARE - Administrator, Civil Service (Medical): No    Lack of Transportation (Non-Medical): No  Physical Activity: Not on file  Stress: Not on file  Social Connections: Moderately Isolated (12/12/2023)   Social Connection and Isolation Panel    Frequency of Communication with Friends and Family: More than three times a week    Frequency of Social Gatherings with Friends and Family: Once a week    Attends Religious Services: Never    Database administrator or Organizations: No    Attends Banker Meetings: Never    Marital Status: Married  Catering manager Violence: Not At Risk (12/12/2023)   Humiliation, Afraid, Rape, and Kick questionnaire    Fear of Current or Ex-Partner: No    Emotionally Abused: No    Physically Abused: No    Sexually Abused: No    Family History  Problem Relation Age of Onset   Diabetes Mother    Heart disease Mother    Cancer Mother    Breast cancer Mother        >50   Heart disease Father    Diabetes Sister    Heart disease Sister      Current Outpatient Medications:    albuterol  (VENTOLIN  HFA) 108 (90 Base) MCG/ACT inhaler, Inhale 2 puffs into the lungs every 6 (six) hours as needed for wheezing or shortness of breath., Disp: 18 g, Rfl: 3   bisacodyl  (DULCOLAX) 10 MG suppository, Place 1 suppository (10 mg total) rectally as needed for moderate  constipation., Disp: 12 suppository, Rfl: 0   budesonide -formoterol  (SYMBICORT ) 80-4.5 MCG/ACT inhaler, Inhale 2 puffs into the lungs 2 (two) times daily., Disp: 10.2 g, Rfl: 3   candesartan (ATACAND) 16 MG tablet, Take 16 mg by mouth in the morning., Disp: , Rfl:    cholecalciferol  (VITAMIN D ) 25 MCG (1000 UNIT) tablet, Take 1,000 Units by mouth in the morning., Disp: , Rfl:    Cyanocobalamin  (VITAMIN B-12) 5000 MCG TBDP, Take 5,000 mcg by mouth in the morning., Disp: , Rfl:  cyclobenzaprine  (FLEXERIL ) 10 MG tablet, Take 10 mg by mouth at bedtime., Disp: , Rfl:    Dulaglutide (TRULICITY) 1.5 MG/0.5ML SOAJ, Inject 1.5 mg into the skin once a week. Saturday, Disp: , Rfl:    gabapentin  (NEURONTIN ) 100 MG capsule, Take 200 mg by mouth with breakfast, with lunch, and with evening meal., Disp: , Rfl:    gabapentin  (NEURONTIN ) 300 MG capsule, Take 300 mg by mouth at bedtime., Disp: , Rfl:    glipiZIDE  (GLUCOTROL ) 10 MG tablet, Take 10 mg by mouth daily before breakfast., Disp: , Rfl:    Magnesium  Oxide -Mg Supplement 500 MG TABS, Take 1 tablet by mouth at bedtime., Disp: , Rfl:    omeprazole  (PRILOSEC) 20 MG capsule, Take 1 capsule (20 mg total) by mouth daily., Disp: 90 capsule, Rfl: 0   ondansetron  (ZOFRAN ) 8 MG tablet, TAKE 1 TABLET BY MOUTH EVERY 8 HOURS AS NEEDED FOR NAUSEA OR VOMITING. START ON THE 3RD DAY AFTER CHEMOTHERAPY, Disp: 30 tablet, Rfl: 1   ONETOUCH ULTRA TEST test strip, , Disp: , Rfl:    osimertinib  mesylate (TAGRISSO ) 80 MG tablet, Take 1 tablet (80 mg total) by mouth daily., Disp: 30 tablet, Rfl: 2   oxybutynin  (DITROPAN ) 5 MG tablet, Take 1 tablet by mouth 2 (two) times daily., Disp: , Rfl:    OXYGEN, Inhale 2 L into the lungs., Disp: , Rfl:    prochlorperazine  (COMPAZINE ) 10 MG tablet, Take 1 tablet (10 mg total) by mouth every 6 (six) hours as needed for nausea or vomiting., Disp: 90 tablet, Rfl: 0   rOPINIRole  (REQUIP ) 1 MG tablet, Take 1 mg by mouth at bedtime., Disp: , Rfl:     sucralfate  (CARAFATE ) 1 g tablet, Take 1 tablet (1 g total) by mouth 3 (three) times daily with meals. Dissolve tablet in 4 tablesppons warm water swish and swallow 30 min before meals. (Patient taking differently: Take 1 g by mouth 3 (three) times daily with meals. Dissolve tablet in 4 tablespoons warm water swish and swallow 30 min before meals.), Disp: 90 tablet, Rfl: 0   warfarin (COUMADIN ) 5 MG tablet, Take 5 mg by mouth daily., Disp: , Rfl:   Physical exam:  Vitals:   01/30/24 1521  BP: (!) 96/51  Pulse: (!) 109  Resp: 16  SpO2: 97%  Weight: 173 lb 1.6 oz (78.5 kg)   Physical Exam Vitals reviewed.  Constitutional:      Appearance: She is not ill-appearing.  Pulmonary:     Effort: No respiratory distress.  Abdominal:     General: There is no distension.     Tenderness: There is no abdominal tenderness.  Neurological:     Mental Status: She is alert and oriented to person, place, and time.  Psychiatric:        Mood and Affect: Mood normal.        Behavior: Behavior normal.         Latest Ref Rng & Units 01/30/2024    2:55 PM  CMP  Glucose 70 - 99 mg/dL 825   BUN 8 - 23 mg/dL 9   Creatinine 9.55 - 8.99 mg/dL 9.13   Sodium 864 - 854 mmol/L 135   Potassium 3.5 - 5.1 mmol/L 3.8   Chloride 98 - 111 mmol/L 102   CO2 22 - 32 mmol/L 24   Calcium  8.9 - 10.3 mg/dL 8.8       Latest Ref Rng & Units 01/30/2024    2:55 PM  CBC  WBC  4.0 - 10.5 K/uL 4.2   Hemoglobin 12.0 - 15.0 g/dL 88.4   Hematocrit 63.9 - 46.0 % 36.0   Platelets 150 - 400 K/uL 94     Assessment and plan- Patient is a 73 y.o. female diagnosed with recurrent lung cancer who presents to Symptom Management Clinic for complaints of:   Fecal urgency- side effect of tagrisso  vs infectious vs others? Given recent hospitalization and antibiotics recommended ruling out c diff however clinically not consistent with infectious etiology given that symptoms occur once a day and stools are soft but formed. Consider  restarting tagrisso  in 2-3 days. If fecal urgency recurs, consider taking 1 tablet imodium  1 hour prior to taking tagrisso . May need to increase to 2 tablets. If constipation occurs, hold imodium .  Recurrent lung cancer- s/p concurrent chemo-radiation. Now on tagrisso .  Thrombocytopenia- Plt 94. Monitor.   Disposition:  Follow up as scheduled. Notify clinic for reevaluation if symptoms don't improve or worsen in interim.    Visit Diagnosis 1. Fecal urgency   2. Medication side effects, initial encounter    Patient expressed understanding and was in agreement with this plan. She also understands that She can call clinic at any time with any questions, concerns, or complaints.   Thank you for allowing me to participate in the care of this very pleasant patient.   Tinnie Dawn, DNP, AGNP-C, AOCNP [facility] at [facility] [phone]  CC: Dr. Melanee

## 2024-01-31 ENCOUNTER — Telehealth: Payer: Self-pay

## 2024-01-31 NOTE — Telephone Encounter (Signed)
 Spoke with pt that she went for POC evaluation but she unable walk so she is not able to get POC and they gave her small tanks  and also lmom for [employer]

## 2024-02-03 DIAGNOSIS — Z86711 Personal history of pulmonary embolism: Secondary | ICD-10-CM | POA: Diagnosis not present

## 2024-02-03 DIAGNOSIS — Z7901 Long term (current) use of anticoagulants: Secondary | ICD-10-CM | POA: Diagnosis not present

## 2024-02-08 DIAGNOSIS — Z86711 Personal history of pulmonary embolism: Secondary | ICD-10-CM | POA: Diagnosis not present

## 2024-02-08 DIAGNOSIS — Z7901 Long term (current) use of anticoagulants: Secondary | ICD-10-CM | POA: Diagnosis not present

## 2024-02-16 ENCOUNTER — Telehealth: Payer: Self-pay | Admitting: *Deleted

## 2024-02-16 DIAGNOSIS — C3411 Malignant neoplasm of upper lobe, right bronchus or lung: Secondary | ICD-10-CM

## 2024-02-16 DIAGNOSIS — Z86711 Personal history of pulmonary embolism: Secondary | ICD-10-CM | POA: Diagnosis not present

## 2024-02-16 DIAGNOSIS — Z7901 Long term (current) use of anticoagulants: Secondary | ICD-10-CM | POA: Diagnosis not present

## 2024-02-16 NOTE — Telephone Encounter (Signed)
 The patient called and said that she has been off of Terezo for 2 weeks.  She was taken off because it was causing diarrhea.  The patient said after 2 days of being off of the Terezo the diarrhea stopped.  sHe wants to know if she needs to go back on the medicine or something else.  Like a callback about this.  Next appointment is 03/07/2024 for labs

## 2024-02-17 MED ORDER — OSIMERTINIB MESYLATE 40 MG PO TABS: 40.0000 mg | ORAL_TABLET | Freq: Every day | ORAL | 3 refills | Status: DC

## 2024-02-17 NOTE — Telephone Encounter (Signed)
 She can restart half the dose at 40 mg daily and if she tolerates it well we can rechallenge at full dose.  Kara [name]- does she need a new prescription?

## 2024-02-17 NOTE — Telephone Encounter (Signed)
 Spoke with Ms. Koloski, she is aware the plan it to start her back but at the reduced dose to Tagrisso  40mg  daily. She will reach out to the assistance pharmacy to get set-up for the new dose.

## 2024-02-17 NOTE — Telephone Encounter (Signed)
 Rx for new 40mg  dose sent to the assistance pharmacy for Ms. Belitz. Attempted call to patient to update her on the plan, had to LVM for her to call me back.

## 2024-02-21 ENCOUNTER — Ambulatory Visit: Admitting: Pain Medicine

## 2024-02-22 ENCOUNTER — Ambulatory Visit: Admitting: Internal Medicine

## 2024-02-22 DIAGNOSIS — R0602 Shortness of breath: Secondary | ICD-10-CM | POA: Diagnosis not present

## 2024-02-23 DIAGNOSIS — C3411 Malignant neoplasm of upper lobe, right bronchus or lung: Secondary | ICD-10-CM | POA: Diagnosis not present

## 2024-02-23 DIAGNOSIS — Z86718 Personal history of other venous thrombosis and embolism: Secondary | ICD-10-CM | POA: Diagnosis not present

## 2024-02-23 DIAGNOSIS — E1142 Type 2 diabetes mellitus with diabetic polyneuropathy: Secondary | ICD-10-CM | POA: Diagnosis not present

## 2024-02-23 DIAGNOSIS — Z8701 Personal history of pneumonia (recurrent): Secondary | ICD-10-CM | POA: Diagnosis not present

## 2024-02-23 DIAGNOSIS — I5032 Chronic diastolic (congestive) heart failure: Secondary | ICD-10-CM | POA: Diagnosis not present

## 2024-02-23 DIAGNOSIS — D701 Agranulocytosis secondary to cancer chemotherapy: Secondary | ICD-10-CM | POA: Diagnosis not present

## 2024-02-23 DIAGNOSIS — I110 Hypertensive heart disease with heart failure: Secondary | ICD-10-CM | POA: Diagnosis not present

## 2024-02-24 ENCOUNTER — Telehealth: Payer: Self-pay | Admitting: *Deleted

## 2024-02-24 ENCOUNTER — Other Ambulatory Visit: Payer: Self-pay

## 2024-02-24 NOTE — Telephone Encounter (Signed)
 Patient leaves a message that she wants to talk to Dr. Melanee about this Rx but does not leave medication is so I left her a message to call me back and tell me what kind patient is she talking about

## 2024-02-27 DIAGNOSIS — J9611 Chronic respiratory failure with hypoxia: Secondary | ICD-10-CM | POA: Diagnosis not present

## 2024-02-27 DIAGNOSIS — I509 Heart failure, unspecified: Secondary | ICD-10-CM | POA: Diagnosis not present

## 2024-02-29 DIAGNOSIS — Z86711 Personal history of pulmonary embolism: Secondary | ICD-10-CM | POA: Diagnosis not present

## 2024-02-29 DIAGNOSIS — Z7901 Long term (current) use of anticoagulants: Secondary | ICD-10-CM | POA: Diagnosis not present

## 2024-03-02 ENCOUNTER — Telehealth: Payer: Self-pay

## 2024-03-02 NOTE — Telephone Encounter (Signed)
 Per Secure chat Patient would like a call back regarding her medication that changed from 80 mg to 40mg . she has some questions.. Per Dr. Melanee Tagrisso  dose from 80mg  to 40mg  due to diarrhea.  Diarrhea. Outbound call; spoke to patient and informed of above.  Informed per manufacturer guidelines recommends Store TAGRISSO  bottles at 25C (71F). Excursions permitted to 15-30C (59-14F) [see [employer] Controlled Room Temperature]; patient mentioned medication delivered this morning and has been on her front porch.  Also advised to start taking reduced dose of Tagrisso  today.

## 2024-03-07 ENCOUNTER — Inpatient Hospital Stay

## 2024-03-08 ENCOUNTER — Ambulatory Visit (HOSPITAL_BASED_OUTPATIENT_CLINIC_OR_DEPARTMENT_OTHER): Admitting: Pain Medicine

## 2024-03-08 ENCOUNTER — Encounter: Payer: Self-pay | Admitting: Pain Medicine

## 2024-03-08 ENCOUNTER — Ambulatory Visit
Admission: RE | Admit: 2024-03-08 | Discharge: 2024-03-08 | Disposition: A | Discharge status: Home / Self Care | Source: Ambulatory Visit | Attending: Pain Medicine | Admitting: Pain Medicine

## 2024-03-08 VITALS — BP 111/96 | Temp 98.1°F | Resp 32 | Ht 61.0 in | Wt 151.0 lb

## 2024-03-08 DIAGNOSIS — M5459 Other low back pain: Secondary | ICD-10-CM | POA: Diagnosis not present

## 2024-03-08 DIAGNOSIS — M47817 Spondylosis without myelopathy or radiculopathy, lumbosacral region: Secondary | ICD-10-CM | POA: Diagnosis not present

## 2024-03-08 DIAGNOSIS — Z7901 Long term (current) use of anticoagulants: Secondary | ICD-10-CM | POA: Diagnosis not present

## 2024-03-08 DIAGNOSIS — M545 Low back pain, unspecified: Secondary | ICD-10-CM | POA: Diagnosis not present

## 2024-03-08 DIAGNOSIS — G8918 Other acute postprocedural pain: Secondary | ICD-10-CM

## 2024-03-08 DIAGNOSIS — M47816 Spondylosis without myelopathy or radiculopathy, lumbar region: Secondary | ICD-10-CM | POA: Diagnosis not present

## 2024-03-08 DIAGNOSIS — G8929 Other chronic pain: Secondary | ICD-10-CM | POA: Insufficient documentation

## 2024-03-08 DIAGNOSIS — M4306 Spondylolysis, lumbar region: Secondary | ICD-10-CM | POA: Diagnosis not present

## 2024-03-08 DIAGNOSIS — M4310 Spondylolisthesis, site unspecified: Secondary | ICD-10-CM | POA: Insufficient documentation

## 2024-03-08 MED ORDER — FENTANYL CITRATE (PF) 100 MCG/2ML IJ SOLN
INTRAMUSCULAR | Status: AC
  Filled 2024-03-08: qty 2

## 2024-03-08 MED ORDER — TRIAMCINOLONE ACETONIDE 40 MG/ML IJ SUSP
INTRAMUSCULAR | Status: AC
  Filled 2024-03-08: qty 1

## 2024-03-08 MED ORDER — HYDROCODONE-ACETAMINOPHEN 5-325 MG PO TABS: 1.0000 | ORAL_TABLET | Freq: Three times a day (TID) | ORAL | 0 refills | Status: AC | PRN

## 2024-03-08 MED ORDER — FENTANYL CITRATE (PF) 100 MCG/2ML IJ SOLN: 25.0000 ug | INTRAMUSCULAR | Status: DC | PRN

## 2024-03-08 MED ORDER — PENTAFLUOROPROP-TETRAFLUOROETH EX AERO
INHALATION_SPRAY | Freq: Once | CUTANEOUS | Status: AC
  Administered 2024-03-08: 30 via TOPICAL

## 2024-03-08 MED ORDER — MIDAZOLAM HCL 5 MG/5ML IJ SOLN
0.5000 mg | Freq: Once | INTRAMUSCULAR | Status: AC
  Administered 2024-03-08 (×2): 1 mg via INTRAVENOUS

## 2024-03-08 MED ORDER — ROPIVACAINE HCL 2 MG/ML IJ SOLN
INTRAMUSCULAR | Status: AC
  Filled 2024-03-08: qty 20

## 2024-03-08 MED ORDER — ROPIVACAINE HCL 2 MG/ML IJ SOLN
9.0000 mL | Freq: Once | INTRAMUSCULAR | Status: AC
  Administered 2024-03-08: 9 mL via PERINEURAL

## 2024-03-08 MED ORDER — TRIAMCINOLONE ACETONIDE 40 MG/ML IJ SUSP
40.0000 mg | Freq: Once | INTRAMUSCULAR | Status: AC
  Administered 2024-03-08: 40 mg

## 2024-03-08 MED ORDER — LIDOCAINE HCL 2 % IJ SOLN
20.0000 mL | Freq: Once | INTRAMUSCULAR | Status: AC
  Administered 2024-03-08: 400 mg

## 2024-03-08 MED ORDER — MIDAZOLAM HCL 5 MG/5ML IJ SOLN
INTRAMUSCULAR | Status: AC
  Filled 2024-03-08: qty 5

## 2024-03-08 MED ORDER — LIDOCAINE HCL 2 % IJ SOLN
INTRAMUSCULAR | Status: AC
  Filled 2024-03-08: qty 20

## 2024-03-08 NOTE — Progress Notes (Signed)
 PROVIDER NOTE: Interpretation of information contained herein should be left to medically-trained personnel. Specific patient instructions are provided elsewhere under Patient Instructions section of medical record. This document was created in part using STT-dictation technology, any transcriptional errors that may result from this process are unintentional.  Patient: Kara [name] Type: Established DOB: Aug 31, 1950 MRN: [id number] PCP: Sadie Manna, MD  Service: Procedure DOS: 03/08/2024 Setting: Ambulatory Location: Ambulatory outpatient facility Delivery: Face-to-face Provider: Eric DELENA Como, MD Specialty: Interventional Pain Management Specialty designation: 09 Location: Outpatient facility Ref. Prov.: Sadie Manna, MD       Interventional Therapy   Procedure: Lumbar Facet, Medial Branch Radiofrequency Ablation (RFA) #2  Laterality: Right (-RT)  Level: L2, L3, L4, L5, and S1 Medial Branch Level(s). These levels will denervate the L3-4, L4-5, and L5-S1 lumbar facet joints.  Imaging: Fluoroscopy-guided Spinal ([id number]) Anesthesia: Local anesthesia (1-2% Lidocaine ) Anxiolysis: IV Versed  2.0 mg Sedation: Minimal Sedation None required. No Fentanyl  administered.         DOS: 03/08/2024  Performed by: Eric DELENA Como, MD  Purpose: Therapeutic/Palliative Indications: Low back pain severe enough to impact quality of life or function. Indications: 1. Chronic low back pain (1ry area of Pain) (Bilateral) w/o sciatica   2. 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 Lumbar facet joint pain   4. Lumbar facet syndrome (Bilateral)   5. Lumbar facet arthropathy (L3-4, L4-5, and L5-S1) (Bilateral)   6. 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 Spondylosis without myelopathy or radiculopathy, lumbosacral region    Chronic anticoagulation (COUMADIN )    Kara [name]  has been dealing with the above chronic pain for longer than three months and has either failed to respond, was unable to tolerate, or simply did not get enough benefit from other more conservative therapies including, but not limited to: 1. Over-the-counter medications 2. Anti-inflammatory medications 3. Muscle relaxants 4. Membrane stabilizers 5. Opioids 6. Physical therapy and/or chiropractic manipulation 7. Modalities (Heat, ice, etc.) 8. Invasive techniques such as nerve blocks. Kara [name] has attained more than 50% relief of the pain from a series of diagnostic injections conducted in separate occasions.  Pain Score: Pre-procedure: 10-Worst pain ever/10 Post-procedure: 0-No pain/10     Position / Prep / Materials:  Position: Prone  Prep solution: ChloraPrep (2% chlorhexidine  gluconate and 70% isopropyl alcohol ) Prep Area: Entire Lumbosacral Region (Lower back from mid-thoracic region to end of tailbone and from flank to flank.) Materials:  Tray: RFA (Radiofrequency) tray Needle(s):  Type: RFA (Teflon-coated radiofrequency ablation needles) Gauge (G): 22  Length: Regular (10cm) Qty: 5     H&P (Pre-op Assessment):  Kara [name] is a 73 y.o. (year old), female patient, seen today for interventional treatment. She  has a past surgical history that includes Knee surgery (Right); Carpal tunnel release (Bilateral); Colon resection sigmoid (N/A, 11/02/2016); Colostomy (N/A, 11/02/2016); Sigmoidoscopy (N/A, 11/13/2016); PULMONARY VENOGRAPHY (N/A, 11/19/2016); Colonoscopy with propofol  (N/A, 02/03/2017); Colon surgery (11/02/2016); IVC FILTER INSERTION (Right, 10/2016); Colostomy reversal (N/A, 02/24/2017); Appendectomy (N/A, 02/24/2017); Lysis of adhesion (N/A, 02/24/2017); laparoscopy (N/A, 02/24/2017); Ileo loop diversion (N/A, 02/24/2017); Sigmoidoscopy (N/A, 05/19/2017); Ileostomy closure (N/A, 06/01/2017); IVC FILTER REMOVAL (N/A, 08/09/2017); Sacroplasty (N/A, 11/08/2017); Cataract  extraction w/PHACO (Left, 11/21/2018); Cataract extraction w/PHACO (Right, 12/12/2018); Video bronchoscopy with endobronchial navigation (N/A, 02/23/2021); Flexible bronchoscopy (N/A, 05/16/2023); Endobronchial ultrasound (N/A, 05/16/2023); IR IMAGING GUIDED PORT INSERTION (06/14/2023); and IR REMOVAL TUN ACCESS W/ PORT  W/O FL MOD SED (11/16/2023). Kara [name] has a current medication list which includes the following prescription(s): albuterol , bisacodyl , budesonide -formoterol , candesartan, cholecalciferol , vitamin b-12, cyclobenzaprine , trulicity, gabapentin , gabapentin , glipizide , hydrocodone -acetaminophen , [START ON 03/15/2024] hydrocodone -acetaminophen , magnesium  oxide -mg supplement, omeprazole , ondansetron , onetouch ultra test, osimertinib  mesylate, oxybutynin , oxygen-helium, prochlorperazine , ropinirole , sucralfate , and warfarin, and the following Facility-Administered Medications: fentanyl . Her primarily concern today is the Back Pain (Left, lower)  Initial Vital Signs:  Pulse/HCG Rate:  ECG Heart Rate: (!) 105 Temp: 98.1 F (36.7 C) Resp: 18 BP: 126/71 SpO2: 100 % (Room air, but she uses O2 at home continuously)  BMI: Estimated body mass index is 28.53 kg/m as calculated from the following:   Height as of this encounter: 5' 1 (1.549 m).   Weight as of this encounter: 151 lb (68.5 kg).  Risk Assessment: Allergies: Reviewed. She is allergic to adhesive [tape], avapro  [irbesartan ], morphine  and codeine , other, and latex.  Allergy Precautions: None required Coagulopathies: Reviewed. None identified.  Blood-thinner therapy: None at this time Active Infection(s): Reviewed. None identified. Kara [name] is afebrile  Site Confirmation: Kara [name] was asked to confirm the procedure and laterality before marking the site Procedure checklist: Completed Consent: Before the procedure and under the influence of no sedative(s), amnesic(s), or anxiolytics, the patient was informed of the  treatment options, risks and possible complications. To fulfill our ethical and legal obligations, as recommended by the American Medical Association's Code of Ethics, I have informed the patient of my clinical impression; the nature and purpose of the treatment or procedure; the risks, benefits, and possible complications of the intervention; the alternatives, including doing nothing; the risk(s) and benefit(s) of the alternative treatment(s) or procedure(s); and the risk(s) and benefit(s) of doing nothing. The patient was provided information about the general risks and possible complications associated with the procedure. These may include, but are not limited to: failure to achieve desired goals, infection, bleeding, organ or nerve damage, allergic reactions, paralysis, and death. In addition, the patient was informed of those risks and complications associated to Spine-related procedures, such as failure to decrease pain; infection (i.e.: Meningitis, epidural or intraspinal abscess); bleeding (i.e.: epidural hematoma, subarachnoid hemorrhage, or any other type of intraspinal or peri-dural bleeding); organ or nerve damage (i.e.: Any type of peripheral nerve, nerve root, or spinal cord injury) with subsequent damage to sensory, motor, and/or autonomic systems, resulting in permanent pain, numbness, and/or weakness of one or several areas of the body; allergic reactions; (i.e.: anaphylactic reaction); and/or death. Furthermore, the patient was informed of those risks and complications associated with the medications. These include, but are not limited to: allergic reactions (i.e.: anaphylactic or anaphylactoid reaction(s)); adrenal axis suppression; blood sugar elevation that in diabetics may result in ketoacidosis or comma; water retention that in patients with history of congestive heart failure may result in shortness of breath, pulmonary edema, and decompensation with resultant heart failure; weight gain;  swelling or edema; medication-induced neural toxicity; particulate matter embolism and blood vessel occlusion with resultant organ, and/or nervous system infarction; and/or aseptic necrosis of one or more joints. Finally, the patient was informed that Medicine is not an exact science; therefore, there is also the possibility of unforeseen or unpredictable risks and/or possible complications that may result in a catastrophic outcome. The patient indicated having understood very clearly. We have given the patient no guarantees and we have made no promises. Enough time was given to the patient to ask questions, all of which were answered to the patient's satisfaction. Ms. Noyes has  indicated that she wanted to continue with the procedure. Attestation: I, the ordering provider, attest that I have discussed with the patient the benefits, risks, side-effects, alternatives, likelihood of achieving goals, and potential problems during recovery for the procedure that I have provided informed consent. Date  Time: 03/08/2024 10:47 AM  Pre-Procedure Preparation:  Monitoring: As per clinic protocol. Respiration, ETCO2, SpO2, BP, heart rate and rhythm monitor placed and checked for adequate function Safety Precautions: Patient was assessed for positional comfort and pressure points before starting the procedure. Time-out: I initiated and conducted the Time-out before starting the procedure, as per protocol. The patient was asked to participate by confirming the accuracy of the Time Out information. Verification of the correct person, site, and procedure were performed and confirmed by me, the nursing staff, and the patient. Time-out conducted as per Joint Commission's Universal Protocol (UP.01.01.01). Time: 1159 Start Time: 1159 hrs.  Description of Procedure:          Laterality: See above. Levels:  See above. Safety Precautions: Aspiration looking for blood return was conducted prior to all injections.  At no point did we inject any substances, as a needle was being advanced. Before injecting, the patient was told to immediately notify me if she was experiencing any new onset of ringing in the ears, or metallic taste in the mouth. No attempts were made at seeking any paresthesias. Safe injection practices and needle disposal techniques used. Medications properly checked for expiration dates. SDV (single dose vial) medications used. After the completion of the procedure, all disposable equipment used was discarded in the proper designated medical waste containers. Local Anesthesia: Protocol guidelines were followed. The patient was positioned over the fluoroscopy table. The area was prepped in the usual manner. The time-out was completed. The target area was identified using fluoroscopy. A 12-in long, straight, sterile hemostat was used with fluoroscopic guidance to locate the targets for each level blocked. Once located, the skin was marked with an approved surgical skin marker. Once all sites were marked, the skin (epidermis, dermis, and hypodermis), as well as deeper tissues (fat, connective tissue and muscle) were infiltrated with a small amount of a short-acting local anesthetic, loaded on a 10cc syringe with a 25G, 1.5-in  Needle. An appropriate amount of time was allowed for local anesthetics to take effect before proceeding to the next step. Technical description of process:  Radiofrequency Ablation (RFA) L2 Medial Branch Nerve RFA: The target area for the L2 medial branch is at the junction of the postero-lateral aspect of the superior articular process and the superior, posterior, and medial edge of the transverse process of L3. Under fluoroscopic guidance, a Radiofrequency needle was inserted until contact was made with os over the superior postero-lateral aspect of the pedicular shadow (target area). Sensory and motor testing was conducted to properly adjust the position of the needle. Once  satisfactory placement of the needle was achieved, the numbing solution was slowly injected after negative aspiration for blood. 2.0 mL of the nerve block solution was injected without difficulty or complication. After waiting for at least 3 minutes, the ablation was performed. Once completed, the needle was removed intact. L3 Medial Branch Nerve RFA: The target area for the L3 medial branch is at the junction of the postero-lateral aspect of the superior articular process and the superior, posterior, and medial edge of the transverse process of L4. Under fluoroscopic guidance, a Radiofrequency needle was inserted until contact was made with os over the superior postero-lateral aspect of the  pedicular shadow (target area). Sensory and motor testing was conducted to properly adjust the position of the needle. Once satisfactory placement of the needle was achieved, the numbing solution was slowly injected after negative aspiration for blood. 2.0 mL of the nerve block solution was injected without difficulty or complication. After waiting for at least 3 minutes, the ablation was performed. Once completed, the needle was removed intact. L4 Medial Branch Nerve RFA: The target area for the L4 medial branch is at the junction of the postero-lateral aspect of the superior articular process and the superior, posterior, and medial edge of the transverse process of L5. Under fluoroscopic guidance, a Radiofrequency needle was inserted until contact was made with os over the superior postero-lateral aspect of the pedicular shadow (target area). Sensory and motor testing was conducted to properly adjust the position of the needle. Once satisfactory placement of the needle was achieved, the numbing solution was slowly injected after negative aspiration for blood. 2.0 mL of the nerve block solution was injected without difficulty or complication. After waiting for at least 3 minutes, the ablation was performed. Once completed,  the needle was removed intact. L5 Medial Branch Nerve RFA: The target area for the L5 medial branch is at the junction of the postero-lateral aspect of the superior articular process of S1 and the superior, posterior, and medial edge of the sacral ala. Under fluoroscopic guidance, a Radiofrequency needle was inserted until contact was made with os over the superior postero-lateral aspect of the pedicular shadow (target area). Sensory and motor testing was conducted to properly adjust the position of the needle. Once satisfactory placement of the needle was achieved, the numbing solution was slowly injected after negative aspiration for blood. 2.0 mL of the nerve block solution was injected without difficulty or complication. After waiting for at least 3 minutes, the ablation was performed. Once completed, the needle was removed intact. S1 Medial Branch Nerve RFA: The target area for the S1 medial branch is located inferior to the junction of the S1 superior articular process and the L5 inferior articular process, posterior, inferior, and lateral to the 6 o'clock position of the L5-S1 facet joint, just superior to the S1 posterior foramen. Under fluoroscopic guidance, the Radiofrequency needle was advanced until contact was made with os over the Target area. Sensory and motor testing was conducted to properly adjust the position of the needle. Once satisfactory placement of the needle was achieved, the numbing solution was slowly injected after negative aspiration for blood. 2.0 mL of the nerve block solution was injected without difficulty or complication. After waiting for at least 3 minutes, the ablation was performed. Once completed, the needle was removed intact. Radiofrequency lesioning (ablation):  Radiofrequency Generator: Medtronic AccurianTM AG 1000 RF Generator Sensory Stimulation Parameters: 50 Hz was used to locate & identify the nerve, making sure that the needle was positioned such that there was  no sensory stimulation below 0.3 V or above 0.7 V. Motor Stimulation Parameters: 2 Hz was used to evaluate the motor component. Care was taken not to lesion any nerves that demonstrated motor stimulation of the lower extremities at an output of less than 2.5 times that of the sensory threshold, or a maximum of 2.0 V. Lesioning Technique Parameters: Standard Radiofrequency settings. (Not bipolar or pulsed.) Temperature Settings: 80 degrees C Lesioning time: 60 seconds Stationary intra-operative compliance: Compliant  Once the entire procedure was completed, the treated area was cleaned, making sure to leave some of the prepping solution back to take  advantage of its long term bactericidal properties.    Illustration of the posterior view of the lumbar spine and the posterior neural structures. Laminae of L2 through S1 are labeled. DPRL5, dorsal primary ramus of L5; DPRS1, dorsal primary ramus of S1; DPR3, dorsal primary ramus of L3; FJ, facet (zygapophyseal) joint L3-L4; I, inferior articular process of L4; LB1, lateral branch of dorsal primary ramus of L1; IAB, inferior articular branches from L3 medial branch (supplies L4-L5 facet joint); IBP, intermediate branch plexus; MB3, medial branch of dorsal primary ramus of L3; NR3, third lumbar nerve root; S, superior articular process of L5; SAB, superior articular branches from L4 (supplies L4-5 facet joint also); TP3, transverse process of L3.  Facet Joint Innervation (* possible contribution)  L1-2 T12, L1 (L2*)  Medial Branch  L2-3 L1, L2 (L3*)                     L3-4 L2, L3 (L4*)                     L4-5 L3, L4 (L5*)                     L5-S1 L4, L5, S1                        Vitals:   03/08/24 1215 03/08/24 1220 03/08/24 1222 03/08/24 1230  BP: 127/77 (!) 140/88 (!) 142/90 (!) 111/96  Resp: (!) 23 (!) 22 (!) 24 (!) 32  Temp:      TempSrc:      SpO2: 99% 100% 99% 94%  Weight:      Height:        Start Time: 1159 hrs. End Time:  1222 hrs.  Imaging Guidance (Spinal):         Type of Imaging Technique: Fluoroscopy Guidance (Spinal) Indication(s): Fluoroscopy guidance for needle placement to enhance accuracy in procedures requiring precise needle localization for targeted delivery of medication in or near specific anatomical locations not easily accessible without such real-time imaging assistance. Exposure Time: Please see nurses notes. Contrast: None used. Fluoroscopic Guidance: I was personally present during the use of fluoroscopy. Tunnel Vision Technique used to obtain the best possible view of the target area. Parallax error corrected before commencing the procedure. Direction-depth-direction technique used to introduce the needle under continuous pulsed fluoroscopy. Once target was reached, antero-posterior, oblique, and lateral fluoroscopic projection used confirm needle placement in all planes. Images permanently stored in EMR. Interpretation: No contrast injected. I personally interpreted the imaging intraoperatively. Adequate needle placement confirmed in multiple planes. Permanent images saved into the patient's record.  Antibiotic Prophylaxis:   Anti-infectives (From admission, onward)    None      Indication(s): None identified  Post-operative Assessment:  Post-procedure Vital Signs:  Pulse/HCG Rate:  (!) 105 Temp: 98.1 F (36.7 C) Resp: (!) 32 BP: (!) 111/96 SpO2: 94 %  EBL: None  Complications: No immediate post-treatment complications observed by team, or reported by patient.  Note: The patient tolerated the entire procedure well. A repeat set of vitals were taken after the procedure and the patient was kept under observation following institutional policy, for this type of procedure. Post-procedural neurological assessment was performed, showing return to baseline, prior to discharge. The patient was provided with post-procedure discharge instructions, including a section on how to  identify potential problems. Should any problems arise concerning this procedure, the patient was given instructions to  immediately contact us , at any time, without hesitation. In any case, we plan to contact the patient by telephone for a follow-up status report regarding this interventional procedure.  Comments:  No additional relevant information.  Plan of Care (POC)  Orders:  Orders Placed This Encounter  Procedures   Radiofrequency,Lumbar    Scheduling Instructions:     Side(s): Right-sided     Level: L3-4, L4-5, and L5-S1 Facets (L2, L3, L4, L5, and S1 Medial Branch)     Sedation: With Sedation.     Date: 03/08/2024    Where will this procedure be performed?:   [name]   DG [facility] C-ARM 1-60 MIN NO REPORT    Intraoperative interpretation by procedural physician at [facility].    Standing Status:   Standing    Number of Occurrences:   1    Reason for exam::   Assistance in needle guidance and placement for procedures requiring needle placement in or near specific anatomical locations not easily accessible without such assistance.   Informed Consent Details: Physician/Practitioner Attestation; Transcribe to consent form and obtain patient signature    Nursing Order: Transcribe to consent form and obtain patient signature. Note: Always confirm laterality of pain with Ms. Monterrosa, before procedure.    Physician/Practitioner attestation of informed consent for procedure/surgical case:   I, the physician/practitioner, attest that I have discussed with the patient the benefits, risks, side effects, alternatives, likelihood of achieving goals and potential problems during recovery for the procedure that I have provided informed consent.    Procedure:   Lumbar Facet Radiofrequency Ablation    Physician/Practitioner performing the procedure:   Vennesa Bastedo A. Tanya, MD    Indication/Reason:   Low Back Pain, with our without leg pain, due to Facet Joint Arthralgia  (Joint Pain) known as Lumbar Facet Syndrome, secondary to Lumbar, and/or Lumbosacral Spondylosis (Arthritis of the Spine), without myelopathy or radiculopathy (Nerve Damage).   Provide equipment / supplies at bedside    Procedure tray: Radiofrequency Tray Additional material: Large hemostat (x1); Small hemostat (x1); Towels (x8); 4x4 sterile sponge pack (x1) Needle type: Teflon-coated Radiofrequency Needle (Disposable  single use) Size: Regular Quantity: 5    Standing Status:   Standing    Number of Occurrences:   1    Specify:   Radiofrequency Tray   Care order/instruction: Please confirm that the patient has stopped the Coumadin  (Warfarin) X 5 days prior to procedure or surgery.    Please confirm that the patient has stopped the Coumadin  (Warfarin) X 5 days prior to procedure or surgery.    Standing Status:   Standing    Number of Occurrences:   1   Saline lock IV    Have LR [phone] mL available and administer at 125 mL/hr if patient becomes hypotensive.    Standing Status:   Standing    Number of Occurrences:   1   Bleeding precautions    Standing Status:   Standing    Number of Occurrences:   1     Analgesic: No chronic opioid analgesics therapy prescribed by our practice, 2ry to Medication Agreement Violation. Oxycodone  IR 10 mg, 1 tab PO qd,  from PCP.  (Getting oxycodone  from PCP in multiple occasions, according to PMP)(11/21/20; 12/26/20; 01/16/21; 02/13/21). Opioid analgesic pharmacotherapy D/C'ed on 03/03/2021. MME: 20 mg/day.    Medications ordered for procedure: Meds ordered this encounter  Medications   lidocaine  (XYLOCAINE ) 2 % (with pres) injection 400 mg   pentafluoroprop-tetrafluoroeth (GEBAUERS)  aerosol   midazolam  (VERSED ) 5 MG/5ML injection 0.5-2 mg    Make sure Flumazenil is available in the pyxis when using this medication. If oversedation occurs, administer 0.2 mg IV over 15 sec. If after 45 sec no response, administer 0.2 mg again over 1 min; may repeat at 1 min  intervals; not to exceed 4 doses (1 mg)   fentaNYL  (SUBLIMAZE ) injection 25-50 mcg    Make sure Narcan  is available in the pyxis when using this medication. In the event of respiratory depression (RR< 8/min): Titrate NARCAN  (naloxone ) in increments of 0.1 to 0.2 mg IV at 2-3 minute intervals, until desired degree of reversal.   ropivacaine  (PF) 2 mg/mL (0.2%) (NAROPIN ) injection 9 mL   triamcinolone  acetonide (KENALOG -40) injection 40 mg   HYDROcodone -acetaminophen  (NORCO/VICODIN) 5-325 MG tablet    Sig: Take 1 tablet by mouth every 8 (eight) hours as needed for up to 7 days for severe pain (pain score 7-10). Must last 7 days.    Dispense:  21 tablet    Refill:  0    For acute post-operative pain. Not to be refilled. Must last 7 days.   HYDROcodone -acetaminophen  (NORCO/VICODIN) 5-325 MG tablet    Sig: Take 1 tablet by mouth every 8 (eight) hours as needed for up to 7 days for severe pain (pain score 7-10). Must last for 7 days.    Dispense:  21 tablet    Refill:  0    For acute post-operative pain. Not to be refilled. Must last 7 days.   Medications administered: We administered lidocaine , pentafluoroprop-tetrafluoroeth, midazolam , ropivacaine  (PF) 2 mg/mL (0.2%), and triamcinolone  acetonide.  See the medical record for exact dosing, route, and time of administration.    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 x2 (01/28/2022) (100/100/85/85)  Diagnostic/therapeutic left IA hip inj. x1 (01/28/2022)  (100/100/85/85)  Diagnostic/therapeutic right trochanteric bursa inj. x1 (09/15/2021) (85/85/85)  Diagnostic/therapeutic left trochanteric bursa inj. x1 (01/28/2022) (100/100/85/85)  Therapeutic left lumbar facet RFA x1 (07/21/2021) (100/100/100 x1 week/80) (Lasted 9 months)  Therapeutic right lumbar facet RFA x1 (07/07/2021) (100/100/80/80) (Lasted 9 months)  Palliative/Therapeutic left L4-5 LESI x1 (11/10/2017) (100/80/0/0-25)  Diagnostic/therapeutic right L4 TFESI x1 (11/19/2021) (90/80/0/0) (fell at [employer]) Palliative right SI joint block x9 (01/28/2022) (100/100/80/80)  Diagnostic left SI joint block x2 (01/28/2022) (100/100/80/80)  Palliative/Therapeutic (Midline) caudal ESI x3 (06/08/2018) (100/100/100 x 2-3 days/0)    Therapeutic  Palliative (PRN) options:   NO PRN procedures without F2F exam by Dr. Reginald Mangels due to multifactorial etiology of chronic pain. No more procedures until after 08/14/2022.    Pharmacotherapy  Nonopioids transferred 03/27/2020: Calcium        Follow-up plan:   Return in about 6 weeks (around 04/19/2024) for (ECT):(L) L-FCT RFA #2, (Blood Thinner Protocol), (PPE).     Recent Visits No visits were found meeting these conditions. Showing recent visits within past 90 days and meeting all other requirements Today's Visits Date Type Provider Dept  03/08/24 Procedure visit Tanya Glisson, MD [name]  Showing today's  visits and meeting all other requirements Future Appointments Date Type Provider Dept  03/27/24 Appointment Tanya Glisson, MD [name]  Showing future appointments within next 90 days and meeting all other requirements   Disposition: Discharge home  Discharge (Date  Time): 03/08/2024; 1237 hrs.   Primary Care Physician: Sadie Manna, MD Location: [facility] Note by: Glisson DELENA Tanya, MD (TTS technology used. I apologize for any typographical errors that were not  detected and corrected.) Date: 03/08/2024; Time: 12:50 PM  Disclaimer:  Medicine is not an Visual merchandiser. The only guarantee in medicine is that nothing is guaranteed. It is important to note that the decision to proceed with this intervention was based on the information collected from the patient. The Data and conclusions were drawn from the patient's questionnaire, the interview, and the physical examination. Because the information was provided in large part by the patient, it cannot be guaranteed that it has not been purposely or unconsciously manipulated. Every effort has been made to obtain as much relevant data as possible for this evaluation. It is important to note that the conclusions that lead to this procedure are derived in large part from the available data. Always take into account that the treatment will also be dependent on availability of resources and existing treatment guidelines, considered by other Pain Management Practitioners as being common knowledge and practice, at the time of the intervention. For Medico-Legal purposes, it is also important to point out that variation in procedural techniques and pharmacological choices are the acceptable norm. The indications, contraindications, technique, and results of the above procedure should only be interpreted and judged by a Board-Certified Interventional Pain Specialist with extensive familiarity and expertise in the same exact procedure and technique.

## 2024-03-08 NOTE — Patient Instructions (Addendum)
 ______________________________________________________________________    Post-Procedure Discharge Instructions  INSTRUCTIONS Apply ice:  Purpose: This will minimize any swelling and discomfort after procedure.  When: Day of procedure, as soon as you get home. How: Fill a plastic sandwich bag with crushed ice. Cover it with a small towel and apply to injection site. How long: (15 min on, 15 min off) Apply for 15 minutes then remove x 15 minutes.  Repeat sequence on day of procedure, until you go to bed. Apply heat:  Purpose: To treat any soreness and discomfort from the procedure. When: Starting the next day after the procedure. How: Apply heat to procedure site starting the day following the procedure. How long: May continue to repeat daily, until discomfort goes away. Food intake: Start with clear liquids (like water) and advance to regular food, as tolerated.  Physical activities: Keep activities to a minimum for the first 8 hours after the procedure. After that, then as tolerated. Driving: If you have received any sedation, be responsible and do not drive. You are not allowed to drive for 24 hours after having sedation. Blood thinner: (Applies only to those taking blood thinners) You may restart your blood thinner 6 hours after your procedure. Insulin : (Applies only to Diabetic patients taking insulin ) As soon as you can eat, you may resume your normal dosing schedule. Infection prevention: Keep procedure site clean and dry. Shower daily and clean area with soap and water.  PAIN DIARY Post-procedure Pain Diary: Extremely important that this be done correctly and accurately. Recorded information will be used to determine the next step in treatment. For the purpose of accuracy, follow these rules: Evaluate only the area treated. Do not report or include pain from an untreated area. For the purpose of this evaluation, ignore all other areas of pain, except for the treated area. After your  procedure, avoid taking a long nap and attempting to complete the pain diary after you wake up. Instead, set your alarm clock to go off every hour, on the hour, for the initial 8 hours after the procedure. Document the duration of the numbing medicine, and the relief you are getting from it. Do not go to sleep and attempt to complete it later. It will not be accurate. If you received sedation, it is likely that you were given a medication that may cause amnesia. Because of this, completing the diary at a later time may cause the information to be inaccurate. This information is needed to plan your care. Follow-up appointment: Keep your post-procedure follow-up evaluation appointment after the procedure (usually 2 weeks for most procedures, 6 weeks for radiofrequencies). DO NOT FORGET to bring you pain diary with you.   EXPECT... (What should I expect to see with my procedure?) From numbing medicine (AKA: Local Anesthetics): Numbness or decrease in pain. You may also experience some weakness, which if present, could last for the duration of the local anesthetic. Onset: Full effect within 15 minutes of injected. Duration: It will depend on the type of local anesthetic used. On the average, 1 to 8 hours.  From steroids (Applies only if steroids were used): Decrease in swelling or inflammation. Once inflammation is improved, relief of the pain will follow. Onset of benefits: Depends on the amount of swelling present. The more swelling, the longer it will take for the benefits to be seen. In some cases, up to 10 days. Duration: Steroids will stay in the system x 2 weeks. Duration of benefits will depend on multiple posibilities including persistent irritating  factors. Side-effects: If present, they may typically last 2 weeks (the duration of the steroids). Frequent: Cramps (if they occur, drink Gatorade and take over-the-counter Magnesium  450-500 mg once to twice a day); water retention with temporary weight  gain; increases in blood sugar; decreased immune system response; increased appetite. Occasional: Facial flushing (red, warm cheeks); mood swings; menstrual changes. Uncommon: Long-term decrease or suppression of natural hormones; bone thinning. (These are more common with higher doses or more frequent use. This is why we prefer that our patients avoid having any injection therapies in other practices.)  Very Rare: Severe mood changes; psychosis; aseptic necrosis. From procedure: Some discomfort is to be expected once the numbing medicine wears off. This should be minimal if ice and heat are applied as instructed.  CALL IF... (When should I call?) You experience numbness and weakness that gets worse with time, as opposed to wearing off. New onset bowel or bladder incontinence. (Applies only to procedures done in the spine)  Emergency Numbers: Durning business hours (Monday - Thursday, 8:00 AM - 4:00 PM) (Friday, 9:00 AM - 12:00 Noon): [phone] After hours: [phone] NOTE: If you are having a problem and are unable connect with, or to talk to a provider, then go to your nearest urgent care or emergency department. If the problem is serious and urgent, please call 911. ______________________________________________________________________     ______________________________________________________________________    Blood Thinners  IMPORTANT NOTICE:  If you take any of these, make sure to notify the nursing staff.  Failure to do so may result in serious injury.  Recommended time intervals to stop and restart blood-thinners, before & after invasive procedures  Generic Name Brand Name Pre-procedure: Stop medication for this amount of time before your procedure: Post-procedure: Wait this amount of time after the procedure before restarting your medication:  Abciximab Reopro 15 days 2 hrs  Alteplase Activase 10 days 10 days  Anagrelide Agrylin    Apixaban  Eliquis  3 days 6 hrs   Cilostazol Pletal 3 days 5 hrs  Clopidogrel Plavix 7-10 days 2 hrs  Dabigatran Pradaxa 5 days 6 hrs  Dalteparin Fragmin 24 hours 4 hrs  Dipyridamole Aggrenox 11days 2 hrs  Edoxaban Lixiana; Savaysa 3 days 2 hrs  Enoxaparin   Lovenox  24 hours 4 hrs  Eptifibatide Integrillin 8 hours 2 hrs  Fondaparinux  Arixtra 72 hours 12 hrs  Hydroxychloroquine Plaquenil 11 days   Prasugrel Effient 7-10 days 6 hrs  Reteplase Retavase 10 days 10 days  Rivaroxaban  Xarelto  3 days 6 hrs  Ticagrelor Brilinta 5-7 days 6 hrs  Ticlopidine Ticlid 10-14 days 2 hrs  Tinzaparin Innohep 24 hours 4 hrs  Tirofiban Aggrastat 8 hours 2 hrs  Warfarin Coumadin  5 days 2 hrs   Other medications with blood-thinning effects  NOTE: Consider stopping these if you have prolonged bleeding despite not taking any of the above blood thinners. Otherwise ask your provider and this will be decided on a case-by-case basis.  Product indications Generic (Brand) names Note  Cholesterol Lipitor Stop 4 days before procedure  Blood thinner (injectable) Heparin  (LMW or LMWH Heparin ) Stop 24 hours before procedure  Cancer Ibrutinib (Imbruvica) Stop 7 days before procedure  Malaria/Rheumatoid Hydroxychloroquine (Plaquenil) Stop 11 days before procedure  Thrombolytics  10 days before or after procedures   Over-the-counter (OTC) Products with blood-thinning effects  NOTE: Consider stopping these if you have prolonged bleeding despite not taking any of the above blood thinners. Otherwise ask your provider and this will be decided on a  case-by-case basis.  Product Common names Stop Time  Aspirin  > 325 mg Goody Powders, Excedrin, etc. 11 days  Aspirin  <= 81 mg  7 days  Fish oil  4 days  Garlic supplements  7 days  Ginkgo biloba  36 hours  Ginseng  24 hours  NSAIDs Ibuprofen, Naprosyn , etc. 3 days  Vitamin E  4 days   ______________________________________________________________________      ______________________________________________________________________    Steroid injections  Common steroids for injections Triamcinolone : Used by many sports medicine physicians for large joint and bursal injections, often combined with a local anesthetic like lidocaine . A study focusing on coccydynia (tailbone pain) found triamcinolone  was more effective than betamethasone, suggesting it may also be preferable for other localized inflammation conditions. Methylprednisolone : A common alternative to triamcinolone  that is also a strong anti-inflammatory. It is available in different formulations, with the acetate suspension being the long-acting option for intra-articular injections. Dexamethasone : This is a non-particulate steroid, meaning it has a lower risk of tissue damage compared to particulate steroids like triamcinolone  and methylprednisolone . While less common for this specific use, it is an option for targeted injections.   Considerations for physicians Particulate vs. non-particulate steroids: Triamcinolone  and methylprednisolone  are particulate, meaning they can clump together. Dexamethasone  is non-particulate. Particulate steroids are often preferred for their longer-lasting effects but carry a theoretical higher risk for certain injections (though this is less of a concern in the costochondral joints). Combined injectate: Corticosteroids are typically mixed with a local anesthetic like lidocaine  to provide both immediate pain relief (from the anesthetic) and longer-term inflammation reduction (from the steroid). Imaging guidance: To ensure accurate placement of the needle and medication, physicians may use ultrasound or fluoroscopic guidance for the injection, especially in complex or refractory cases.   Patient guidance Before undergoing a steroid injection, discuss the options with your physician. They will determine the best steroid, dosage, and procedure for your specific case  based on factors like: Severity of your condition History of response to other treatments Your overall health status Experience and preference of the physician  Last  Updated: 01/17/2024 ______________________________________________________________________      ______________________________________________________________________    Procedure instructions  Stop blood-thinners  Do not eat or drink fluids (other than water) for 6 hours before your procedure  No water for 2 hours before your procedure  Take your blood pressure medicine with a sip of water  Arrive 30 minutes before your appointment  If sedation is planned, bring suitable driver. [place], [place], & public transportation are NOT APPROVED)  Carefully read the Preparing for your procedure detailed instructions  If you have questions call us  at [phone]  Procedure appointments are for procedures only.   NO medication refills or new problem evaluations will be done on procedure days.   Only the scheduled, pre-approved procedure and side will be done.   ______________________________________________________________________     ______________________________________________________________________    Preparing for your procedure  Appointments: If you think you may not be able to keep your appointment, call 24-48 hours in advance to cancel. We need time to make it available to others.  Procedure visits are for procedures only. During your procedure appointment there will be: NO Prescription Refills*. NO medication changes or discussions*. NO discussion of disability issues*. NO unrelated pain problem evaluations*. NO evaluations to order other pain procedures*. *These will be addressed at a separate and distinct evaluation encounter on the provider's evaluation schedule and not during procedure days.  Instructions: Food intake: Avoid eating anything solid for  at least 8 hours prior to your  procedure. Clear liquid intake: You may take clear liquids such as water up to 2 hours prior to your procedure. (No carbonated drinks. No soda.) Transportation: Unless otherwise stated by your physician, bring a driver. (Driver cannot be a Market researcher, Pharmacist, community, or any other form of public transportation.) Morning Medicines: Except for blood thinners, take all of your other morning medications with a sip of water. Make sure to take your heart and blood pressure medicines. If your blood pressure's lower number is above 100, the case will be rescheduled. Blood thinners: Make sure to stop your blood thinners as instructed.  If you take a blood thinner, but were not instructed to stop it, call our office [phone] and ask to talk to a nurse. Not stopping a blood thinner prior to certain procedures could lead to serious complications. Diabetics on insulin : Notify the staff so that you can be scheduled 1st case in the morning. If your diabetes requires high dose insulin , take only  of your normal insulin  dose the morning of the procedure and notify the staff that you have done so. Preventing infections: Shower with an antibacterial soap the morning of your procedure.  Build-up your immune system: Take 1000 mg of Vitamin C with every meal (3 times a day) the day prior to your procedure. Antibiotics: Inform the nursing staff if you are taking any antibiotics or if you have any conditions that may require antibiotics prior to procedures. (Example: recent joint implants)   Pregnancy: If you are pregnant make sure to notify the nursing staff. Not doing so may result in injury to the fetus, including death.  Sickness: If you have a cold, fever, or any active infections, call and cancel or reschedule your procedure. Receiving steroids while having an infection may result in complications. Arrival: You must be in the facility at least 30 minutes prior to your scheduled procedure. Tardiness: Your scheduled time is also the  cutoff time. If you do not arrive at least 15 minutes prior to your procedure, you will be rescheduled.  Children: Do not bring any children with you. Make arrangements to keep them home. Dress appropriately: There is always a possibility that your clothing may get soiled. Avoid long dresses. Valuables: Do not bring any jewelry or valuables.  Reasons to call and reschedule or cancel your procedure: (Following these recommendations will minimize the risk of a serious complication.) Surgeries: Avoid having procedures within 2 weeks of any surgery. (Avoid for 2 weeks before or after any surgery). Flu Shots: Avoid having procedures within 2 weeks of a flu shots or . (Avoid for 2 weeks before or after immunizations). Barium: Avoid having a procedure within 7-10 days after having had a radiological study involving the use of radiological contrast. (Myelograms, Barium swallow or enema study). Heart attacks: Avoid any elective procedures or surgeries for the initial 6 months after a Myocardial Infarction (Heart Attack). Blood thinners: It is imperative that you stop these medications before procedures. Let us  know if you if you take any blood thinner.  Infection: Avoid procedures during or within two weeks of an infection (including chest colds or gastrointestinal problems). Symptoms associated with infections include: Localized redness, fever, chills, night sweats or profuse sweating, burning sensation when voiding, cough, congestion, stuffiness, runny nose, sore throat, diarrhea, nausea, vomiting, cold or Flu symptoms, recent or current infections. It is specially important if the infection is over the area that we intend to treat. Heart and lung problems:  Symptoms that may suggest an active cardiopulmonary problem include: cough, chest pain, breathing difficulties or shortness of breath, dizziness, ankle swelling, uncontrolled high or unusually low blood pressure, and/or palpitations. If you are  experiencing any of these symptoms, cancel your procedure and contact your primary care physician for an evaluation.  Remember:  Regular Business hours are:  Monday to Thursday 8:00 AM to 4:00 PM  Provider's Schedule: Eric Como, MD:  Procedure days: Tuesday and Thursday 7:30 AM to 4:00 PM  Wallie Sherry, MD:  Procedure days: Monday and Wednesday 7:30 AM to 4:00 PM Last  Updated: 05/03/2023 ______________________________________________________________________     ______________________________________________________________________    General Risks and Possible Complications  Patient Responsibilities: It is important that you read this as it is part of your informed consent. It is our duty to inform you of the risks and possible complications associated with treatments offered to you. It is your responsibility as a patient to read this and to ask questions about anything that is not clear or that you believe was not covered in this document.  Patient's Rights: You have the right to refuse treatment. You also have the right to change your mind, even after initially having agreed to have the treatment done. However, under this last option, if you wait until the last second to change your mind, you may be charged for the materials used up to that point.  Introduction: Medicine is not an Visual merchandiser. Everything in Medicine, including the lack of treatment(s), carries the potential for danger, harm, or loss (which is by definition: Risk). In Medicine, a complication is a secondary problem, condition, or disease that can aggravate an already existing one. All treatments carry the risk of possible complications. The fact that a side effects or complications occurs, does not imply that the treatment was conducted incorrectly. It must be clearly understood that these can happen even when everything is done following the highest safety standards.  No treatment: You can choose not to proceed  with the proposed treatment alternative. The "PRO(s)" would include: avoiding the risk of complications associated with the therapy. The "CON(s)" would include: not getting any of the treatment benefits. These benefits fall under one of three categories: diagnostic; therapeutic; and/or palliative. Diagnostic benefits include: getting information which can ultimately lead to improvement of the disease or symptom(s). Therapeutic benefits are those associated with the successful treatment of the disease. Finally, palliative benefits are those related to the decrease of the primary symptoms, without necessarily curing the condition (example: decreasing the pain from a flare-up of a chronic condition, such as incurable terminal cancer).  General Risks and Complications: These are associated to most interventional treatments. They can occur alone, or in combination. They fall under one of the following six (6) categories: no benefit or worsening of symptoms; bleeding; infection; nerve damage; allergic reactions; and/or death. No benefits or worsening of symptoms: In Medicine there are no guarantees, only probabilities. No healthcare provider can ever guarantee that a medical treatment will work, they can only state the probability that it may. Furthermore, there is always the possibility that the condition may worsen, either directly, or indirectly, as a consequence of the treatment. Bleeding: This is more common if the patient is taking a blood thinner, either prescription or over the counter (example: Goody Powders, Fish oil, Aspirin , Garlic, etc.), or if suffering a condition associated with impaired coagulation (example: Hemophilia, cirrhosis of the liver, low platelet counts, etc.). However, even if you do not have one on  these, it can still happen. If you have any of these conditions, or take one of these drugs, make sure to notify your treating physician. Infection: This is more common in patients with a  compromised immune system, either due to disease (example: diabetes, cancer, human immunodeficiency virus [HIV], etc.), or due to medications or treatments (example: therapies used to treat cancer and rheumatological diseases). However, even if you do not have one on these, it can still happen. If you have any of these conditions, or take one of these drugs, make sure to notify your treating physician. Nerve Damage: This is more common when the treatment is an invasive one, but it can also happen with the use of medications, such as those used in the treatment of cancer. The damage can occur to small secondary nerves, or to large primary ones, such as those in the spinal cord and brain. This damage may be temporary or permanent and it may lead to impairments that can range from temporary numbness to permanent paralysis and/or brain death. Allergic Reactions: Any time a substance or material comes in contact with our body, there is the possibility of an allergic reaction. These can range from a mild skin rash (contact dermatitis) to a severe systemic reaction (anaphylactic reaction), which can result in death. Death: In general, any medical intervention can result in death, most of the time due to an unforeseen complication. ______________________________________________________________________      ______________________________________________________________________    Blood Thinners  IMPORTANT NOTICE:  If you take any of these, make sure to notify the nursing staff.  Failure to do so may result in serious injury.  Recommended time intervals to stop and restart blood-thinners, before & after invasive procedures  Generic Name Brand Name Pre-procedure: Stop medication for this amount of time before your procedure: Post-procedure: Wait this amount of time after the procedure before restarting your medication:  Abciximab Reopro 15 days 2 hrs  Alteplase Activase 10 days 10 days  Anagrelide Agrylin     Apixaban  Eliquis  3 days 6 hrs  Cilostazol Pletal 3 days 5 hrs  Clopidogrel Plavix 7-10 days 2 hrs  Dabigatran Pradaxa 5 days 6 hrs  Dalteparin Fragmin 24 hours 4 hrs  Dipyridamole Aggrenox 11days 2 hrs  Edoxaban Lixiana; Savaysa 3 days 2 hrs  Enoxaparin   Lovenox  24 hours 4 hrs  Eptifibatide Integrillin 8 hours 2 hrs  Fondaparinux  Arixtra 72 hours 12 hrs  Hydroxychloroquine Plaquenil 11 days   Prasugrel Effient 7-10 days 6 hrs  Reteplase Retavase 10 days 10 days  Rivaroxaban  Xarelto  3 days 6 hrs  Ticagrelor Brilinta 5-7 days 6 hrs  Ticlopidine Ticlid 10-14 days 2 hrs  Tinzaparin Innohep 24 hours 4 hrs  Tirofiban Aggrastat 8 hours 2 hrs  Warfarin Coumadin  5 days 2 hrs   Other medications with blood-thinning effects  NOTE: Consider stopping these if you have prolonged bleeding despite not taking any of the above blood thinners. Otherwise ask your provider and this will be decided on a case-by-case basis.  Product indications Generic (Brand) names Note  Cholesterol Lipitor Stop 4 days before procedure  Blood thinner (injectable) Heparin  (LMW or LMWH Heparin ) Stop 24 hours before procedure  Cancer Ibrutinib (Imbruvica) Stop 7 days before procedure  Malaria/Rheumatoid Hydroxychloroquine (Plaquenil) Stop 11 days before procedure  Thrombolytics  10 days before or after procedures   Over-the-counter (OTC) Products with blood-thinning effects  NOTE: Consider stopping these if you have prolonged bleeding despite not taking any of the above blood thinners. Otherwise  ask your provider and this will be decided on a case-by-case basis.  Product Common names Stop Time  Aspirin  > 325 mg Goody Powders, Excedrin, etc. 11 days  Aspirin  <= 81 mg  7 days  Fish oil  4 days  Garlic supplements  7 days  Ginkgo biloba  36 hours  Ginseng  24 hours  NSAIDs Ibuprofen, Naprosyn , etc. 3 days  Vitamin E  4 days   ______________________________________________________________________

## 2024-03-09 ENCOUNTER — Inpatient Hospital Stay: Attending: Oncology

## 2024-03-09 ENCOUNTER — Telehealth: Payer: Self-pay

## 2024-03-09 ENCOUNTER — Telehealth: Payer: Self-pay | Admitting: *Deleted

## 2024-03-09 DIAGNOSIS — Z7722 Contact with and (suspected) exposure to environmental tobacco smoke (acute) (chronic): Secondary | ICD-10-CM | POA: Insufficient documentation

## 2024-03-09 DIAGNOSIS — C3411 Malignant neoplasm of upper lobe, right bronchus or lung: Secondary | ICD-10-CM | POA: Insufficient documentation

## 2024-03-09 DIAGNOSIS — C3490 Malignant neoplasm of unspecified part of unspecified bronchus or lung: Secondary | ICD-10-CM

## 2024-03-09 LAB — CMP (CANCER CENTER ONLY)
ALT: 10 U/L (ref 0–44)
AST: 19 U/L (ref 15–41)
Albumin: 3.2 g/dL — ABNORMAL LOW (ref 3.5–5.0)
Alkaline Phosphatase: 67 U/L (ref 38–126)
Anion gap: 8 (ref 5–15)
BUN: 14 mg/dL (ref 8–23)
CO2: 25 mmol/L (ref 22–32)
Calcium: 9 mg/dL (ref 8.9–10.3)
Chloride: 104 mmol/L (ref 98–111)
Creatinine: 0.82 mg/dL (ref 0.44–1.00)
GFR, Estimated: >60 mL/min (ref >60)
Glucose, Bld: 243 mg/dL — ABNORMAL HIGH (ref 70–99)
Potassium: 4.4 mmol/L (ref 3.5–5.1)
Sodium: 137 mmol/L (ref 135–145)
Total Bilirubin: 0.3 mg/dL (ref 0.0–1.2)
Total Protein: 8.2 g/dL — ABNORMAL HIGH (ref 6.5–8.1)

## 2024-03-09 LAB — CBC WITH DIFFERENTIAL (CANCER CENTER ONLY)
Abs Immature Granulocytes: 0.02 K/uL (ref 0.00–0.07)
Basophils Absolute: 0 K/uL (ref 0.0–0.1)
Basophils Relative: 0 %
Eosinophils Absolute: 0 K/uL (ref 0.0–0.5)
Eosinophils Relative: 0 %
HCT: 35.8 % — ABNORMAL LOW (ref 36.0–46.0)
Hemoglobin: 11.8 g/dL — ABNORMAL LOW (ref 12.0–15.0)
Immature Granulocytes: 0 %
Lymphocytes Relative: 10 %
Lymphs Abs: 0.5 K/uL — ABNORMAL LOW (ref 0.7–4.0)
MCH: 29.8 pg (ref 26.0–34.0)
MCHC: 33 g/dL (ref 30.0–36.0)
MCV: 90.4 fL (ref 80.0–100.0)
Monocytes Absolute: 0.3 K/uL (ref 0.1–1.0)
Monocytes Relative: 5 %
Neutro Abs: 4.4 K/uL (ref 1.7–7.7)
Neutrophils Relative %: 85 %
Platelet Count: 162 K/uL (ref 150–400)
RBC: 3.96 MIL/uL (ref 3.87–5.11)
RDW: 13.7 % (ref 11.5–15.5)
WBC Count: 5.2 K/uL (ref 4.0–10.5)
nRBC: 0 % (ref 0.0–0.2)

## 2024-03-09 NOTE — Telephone Encounter (Signed)
 Ciera in [facility] lab stated that patient came in for labs today but did not bring in a stool sample for c diff.  Reports patient saw a MD outside of the cancer center yesterday and one of her medications was changed and she has not had diarrhea since.  States MD instructed her to hold off on stool sample until they see if change in medication will resolve.

## 2024-03-09 NOTE — Telephone Encounter (Signed)
 Post Procedure Call, Left Voicemail.

## 2024-03-12 NOTE — Procedures (Signed)
 [facility] [address] [place], [address]    Complete Pulmonary Function Testing Interpretation:  FINDINGS:  Forced vital capacity is severely decreased.  The FEV1 is 1.02 L which is 50% of predicted and is moderately decreased.  FEV1 FVC ratio was normal.  Postbronchodilator there is no significant change in FEV1.  Total lung capacity ratio is severely decreased.  Residual volume is decreased FRC is decreased DLCO was not measurable.  IMPRESSION:  This study is consistent with severe restrictive lung disease clinical correlation is recommended.  Kara DELENA Bathe, MD [facility] Pulmonary Critical Care Medicine Sleep Medicine

## 2024-03-13 DIAGNOSIS — C3490 Malignant neoplasm of unspecified part of unspecified bronchus or lung: Secondary | ICD-10-CM | POA: Diagnosis not present

## 2024-03-13 DIAGNOSIS — Z86711 Personal history of pulmonary embolism: Secondary | ICD-10-CM | POA: Diagnosis not present

## 2024-03-13 DIAGNOSIS — J40 Bronchitis, not specified as acute or chronic: Secondary | ICD-10-CM | POA: Diagnosis not present

## 2024-03-13 DIAGNOSIS — Z8719 Personal history of other diseases of the digestive system: Secondary | ICD-10-CM | POA: Diagnosis not present

## 2024-03-14 LAB — PULMONARY FUNCTION TEST

## 2024-03-16 ENCOUNTER — Other Ambulatory Visit: Payer: Self-pay | Admitting: Physician Assistant

## 2024-03-16 DIAGNOSIS — C3490 Malignant neoplasm of unspecified part of unspecified bronchus or lung: Secondary | ICD-10-CM

## 2024-03-16 DIAGNOSIS — Z7901 Long term (current) use of anticoagulants: Secondary | ICD-10-CM | POA: Diagnosis not present

## 2024-03-16 DIAGNOSIS — Z86711 Personal history of pulmonary embolism: Secondary | ICD-10-CM | POA: Diagnosis not present

## 2024-03-16 DIAGNOSIS — J40 Bronchitis, not specified as acute or chronic: Secondary | ICD-10-CM

## 2024-03-21 ENCOUNTER — Ambulatory Visit
Admission: RE | Admit: 2024-03-21 | Discharge: 2024-03-21 | Disposition: A | Source: Ambulatory Visit | Attending: Physician Assistant

## 2024-03-21 ENCOUNTER — Ambulatory Visit
Admission: RE | Admit: 2024-03-21 | Discharge: 2024-03-21 | Disposition: A | Discharge status: Home / Self Care | Source: Ambulatory Visit | Attending: Physician Assistant | Admitting: Physician Assistant

## 2024-03-21 DIAGNOSIS — J40 Bronchitis, not specified as acute or chronic: Secondary | ICD-10-CM | POA: Diagnosis not present

## 2024-03-21 DIAGNOSIS — C3490 Malignant neoplasm of unspecified part of unspecified bronchus or lung: Secondary | ICD-10-CM | POA: Insufficient documentation

## 2024-03-21 MED ORDER — IOHEXOL 300 MG/ML  SOLN
80.0000 mL | Freq: Once | INTRAMUSCULAR | Status: AC | PRN
  Administered 2024-03-21: 80 mL via INTRAVENOUS

## 2024-03-22 ENCOUNTER — Other Ambulatory Visit (HOSPITAL_COMMUNITY): Payer: Self-pay

## 2024-03-22 ENCOUNTER — Encounter: Payer: Self-pay | Admitting: Oncology

## 2024-03-22 ENCOUNTER — Encounter: Payer: Self-pay | Admitting: Internal Medicine

## 2024-03-22 ENCOUNTER — Telehealth: Payer: Self-pay | Admitting: Pharmacy Technician

## 2024-03-22 NOTE — Telephone Encounter (Signed)
 Oral Oncology Patient Advocate Encounter  Patient to switch off of PAP for 2026  Was successful in securing patient a $4000 grant from [employer] to provide copayment coverage for Tagrisso .  This will keep the out of pocket expense at $0.    The billing information is as follows and has been shared with [facility].   Member ID: [id number] Group ID: [id number] RxBin: [id number] PCN: [id number] Dates of Eligibility: 03/22/2024 through 03/22/2025  Fund name:  Non-Small Cell Lung Cancer.    Lindi Abram ([name]) [name], CPhT  [facility], [name], [name] - Oral Chemotherapy Patient Advocate Specialist III Phone: [phone]  Fax: [phone]

## 2024-03-23 ENCOUNTER — Encounter: Payer: Self-pay | Admitting: Oncology

## 2024-03-23 ENCOUNTER — Encounter: Payer: Self-pay | Admitting: Internal Medicine

## 2024-03-26 ENCOUNTER — Telehealth: Payer: Self-pay

## 2024-03-26 ENCOUNTER — Other Ambulatory Visit: Payer: Self-pay

## 2024-03-26 DIAGNOSIS — C3411 Malignant neoplasm of upper lobe, right bronchus or lung: Secondary | ICD-10-CM

## 2024-03-27 ENCOUNTER — Ambulatory Visit: Attending: Pain Medicine | Admitting: Pain Medicine

## 2024-03-27 VITALS — BP 124/82 | HR 86 | Temp 97.3°F | Resp 16 | Ht 63.0 in | Wt 151.0 lb

## 2024-03-27 DIAGNOSIS — Z7901 Long term (current) use of anticoagulants: Secondary | ICD-10-CM | POA: Insufficient documentation

## 2024-03-27 DIAGNOSIS — Z09 Encounter for follow-up examination after completed treatment for conditions other than malignant neoplasm: Secondary | ICD-10-CM | POA: Diagnosis not present

## 2024-03-27 DIAGNOSIS — J209 Acute bronchitis, unspecified: Secondary | ICD-10-CM | POA: Insufficient documentation

## 2024-03-27 DIAGNOSIS — M47816 Spondylosis without myelopathy or radiculopathy, lumbar region: Secondary | ICD-10-CM | POA: Diagnosis not present

## 2024-03-27 NOTE — Patient Instructions (Signed)
 ______________________________________________________________________    Post-Radiofrequency (RF) Discharge Instructions  You have just completed a Radiofrequency Neurotomy.  The following instructions will provide you with information and guidelines for self-care upon discharge.  If at any time you have questions or concerns please call your physician. DO NOT DRIVE YOURSELF!!  Instructions: Apply ice: Fill a plastic sandwich bag with crushed ice. Cover it with a small towel and apply to injection site. Apply for 15 minutes then remove x 15 minutes. Repeat sequence on day of procedure, until you go to bed. The purpose is to minimize swelling and discomfort after procedure. Apply heat: Apply heat to procedure site starting the day following the procedure. The purpose is to treat any soreness and discomfort from the procedure. Food intake: No eating limitations, unless stipulated above.  Nevertheless, if you have had sedation, you may experience some nausea.  In this case, it may be wise to wait at least two hours prior to resuming regular diet. Physical activities: Keep activities to a minimum for the first 8 hours after the procedure. For the first 24 hours after the procedure, do not drive a motor vehicle,  Operate heavy machinery, power tools, or handle any weapons.  Consider walking with the use of an assistive device or accompanied by an adult for the first 24 hours.  Do not drink alcoholic beverages including beer.  Do not make any important decisions or sign any legal documents. Go home and rest today.  Resume activities tomorrow, as tolerated.  Use caution in moving about as you may experience mild leg weakness.  Use caution in cooking, use of household electrical appliances and climbing steps. Driving: If you have received any sedation, you are not allowed to drive for 24 hours after your procedure. Blood thinner: Restart your blood thinner 6 hours after your procedure. (Only for those taking  blood thinners) Insulin: As soon as you can eat, you may resume your normal dosing schedule. (Only for those taking insulin) Medications: May resume pre-procedure medications.  Do not take any drugs, other than what has been prescribed to you. Infection prevention: Keep procedure site clean and dry. Post-procedure Pain Diary: Extremely important that this be done correctly and accurately. Recorded information will be used to determine the next step in treatment. Pain evaluated is that of treated area only. Do not include pain from an untreated area. Complete every hour, on the hour, for the initial 8 hours. Set an alarm to help you do this part accurately. Do not go to sleep and have it completed later. It will not be accurate. Follow-up appointment: Keep your follow-up appointment after the procedure. Usually 2-6 weeks after radiofrequency. Bring you pain diary. The information collected will be essential for your long-term care.   Expect: From numbing medicine (AKA: Local Anesthetics): Numbness or decrease in pain. Onset: Full effect within 15 minutes of injected. Duration: It will depend on the type of local anesthetic used. On the average, 1 to 8 hours.  From steroids (when added): Decrease in swelling or inflammation. Once inflammation is improved, relief of the pain will follow. Onset of benefits: Depends on the amount of swelling present. The more swelling, the longer it will take for the benefits to be seen. In some cases, up to 10 days. Duration: Steroids will stay in the system x 2 weeks. Duration of benefits will depend on multiple posibilities including persistent irritating factors. From procedure: Some discomfort is to be expected once the numbing medicine wears off. In the case of  radiofrequency procedures, this may last as long as 6-8 weeks. Additional post-procedure pain medication is provided for this. Discomfort is minimized if ice and heat are applied as instructed.  Call  if: You experience numbness and weakness that gets worse with time, as opposed to wearing off. He experience any unusual bleeding, difficulty breathing, or loss of the ability to control your bowel and bladder. (This applies to Spinal procedures only) You experience any redness, swelling, heat, red streaks, elevated temperature, fever, or any other signs of a possible infection.  Emergency Numbers: Durning business hours (Monday - Thursday, 8:00 AM - 4:00 PM) (Friday, 9:00 AM - 12:00 Noon): [phone] After hours: [phone] ______________________________________________________________________     ______________________________________________________________________    Steroid injections  Common steroids for injections Triamcinolone : Used by many sports medicine physicians for large joint and bursal injections, often combined with a local anesthetic like lidocaine . A study focusing on coccydynia (tailbone pain) found triamcinolone  was more effective than betamethasone, suggesting it may also be preferable for other localized inflammation conditions. Methylprednisolone : A common alternative to triamcinolone  that is also a strong anti-inflammatory. It is available in different formulations, with the acetate suspension being the long-acting option for intra-articular injections. Dexamethasone : This is a non-particulate steroid, meaning it has a lower risk of tissue damage compared to particulate steroids like triamcinolone  and methylprednisolone . While less common for this specific use, it is an option for targeted injections.   Considerations for physicians Particulate vs. non-particulate steroids: Triamcinolone  and methylprednisolone  are particulate, meaning they can clump together. Dexamethasone  is non-particulate. Particulate steroids are often preferred for their longer-lasting effects but carry a theoretical higher risk for certain injections (though this is less of a concern in the  costochondral joints). Combined injectate: Corticosteroids are typically mixed with a local anesthetic like lidocaine  to provide both immediate pain relief (from the anesthetic) and longer-term inflammation reduction (from the steroid). Imaging guidance: To ensure accurate placement of the needle and medication, physicians may use ultrasound or fluoroscopic guidance for the injection, especially in complex or refractory cases.   Patient guidance Before undergoing a steroid injection, discuss the options with your physician. They will determine the best steroid, dosage, and procedure for your specific case based on factors like: Severity of your condition History of response to other treatments Your overall health status Experience and preference of the physician  Last  Updated: 01/17/2024 ______________________________________________________________________     ______________________________________________________________________    Opioid Pain Medication Update  To: All patients taking opioid pain medications. (I.e.: hydrocodone , hydromorphone, oxycodone , oxymorphone, morphine, codeine, methadone, tapentadol, tramadol, buprenorphine, fentanyl , etc.)  Re: Updated review of side effects and adverse reactions of opioid analgesics, as well as new information about long term effects of this class of medications.  Direct risks of long-term opioid therapy are not limited to opioid addiction and overdose. Potential medical risks include serious fractures, breathing problems during sleep, hyperalgesia, immunosuppression, chronic constipation, bowel obstruction, myocardial infarction, and tooth decay secondary to xerostomia.  Unpredictable adverse effects that can occur even if you take your medication correctly: Cognitive impairment, respiratory depression, and death. Most people think that if they take their medication correctly, and as instructed, that they will be safe. Nothing could be  farther from the truth. In reality, a significant amount of recorded deaths associated with the use of opioids has occurred in individuals that had taken the medication for a long time, and were taking their medication correctly. The following are examples of how this can happen: Patient taking his/her  medication for a long time, as instructed, without any side effects, is given a certain antibiotic or another unrelated medication, which in turn triggers a Drug-to-drug interaction leading to disorientation, cognitive impairment, impaired reflexes, respiratory depression or an untoward event leading to serious bodily harm or injury, including death.  Patient taking his/her medication for a long time, as instructed, without any side effects, develops an acute impairment of liver and/or kidney function. This will lead to a rapid inability of the body to breakdown and eliminate their pain medication, which will result in effects similar to an overdose, but with the same medicine and dose that they had always taken. This again may lead to disorientation, cognitive impairment, impaired reflexes, respiratory depression or an untoward event leading to serious bodily harm or injury, including death.  A similar problem will occur with patients as they grow older and their liver and kidney function begins to decrease as part of the aging process.  Background information: Historically, the original case for using long-term opioid therapy to treat chronic noncancer pain was based on safety assumptions that subsequent experience has called into question. In 1996, the American Pain Society and the American Academy of Pain Medicine issued a consensus statement supporting long-term opioid therapy. This statement acknowledged the dangers of opioid prescribing but concluded that the risk for addiction was low; respiratory depression induced by opioids was short-lived, occurred mainly in opioid-naive patients, and was  antagonized by pain; tolerance was not a common problem; and efforts to control diversion should not constrain opioid prescribing. This has now proven to be wrong. Experience regarding the risks for opioid addiction, misuse, and overdose in community practice has failed to support these assumptions.  According to the Centers for Disease Control and Prevention, fatal overdoses involving opioid analgesics have increased sharply over the past decade. Currently, more than 96,700 people die from drug overdoses every year. Opioids are a factor in 7 out of every 10 overdose deaths. Deaths from drug overdose have surpassed motor vehicle accidents as the leading cause of death for individuals between the ages of 93 and 37.  Clinical data suggest that neuroendocrine dysfunction may be very common in both men and women, potentially causing hypogonadism, erectile dysfunction, infertility, decreased libido, osteoporosis, and depression. Recent studies linked higher opioid dose to increased opioid-related mortality. Controlled observational studies reported that long-term opioid therapy may be associated with increased risk for cardiovascular events. Subsequent meta-analysis concluded that the safety of long-term opioid therapy in elderly patients has not been proven.   Side Effects and adverse reactions: Common side effects: Drowsiness (sedation). Dizziness. Nausea and vomiting. Constipation. Physical dependence -- Dependence often manifests with withdrawal symptoms when opioids are discontinued or decreased. Tolerance -- As you take repeated doses of opioids, you require increased medication to experience the same effect of pain relief. Respiratory depression -- This can occur in healthy people, especially with higher doses. However, people with COPD, asthma or other lung conditions may be even more susceptible to fatal respiratory impairment.  Uncommon side effects: An increased sensitivity to feeling pain and  extreme response to pain (hyperalgesia). Chronic use of opioids can lead to this. Delayed gastric emptying (the process by which the contents of your stomach are moved into your small intestine). Muscle rigidity. Immune system and hormonal dysfunction. Quick, involuntary muscle jerks (myoclonus). Arrhythmia. Itchy skin (pruritus). Dry mouth (xerostomia).  Long-term side effects: Chronic constipation. Sleep-disordered breathing (SDB). Increased risk of bone fractures. Hypothalamic-pituitary-adrenal dysregulation. Increased risk of overdose.  RISKS: Respiratory depression  and death: Opioids increase the risk of respiratory depression and death.  Drug-to-drug interactions: Opioids are relatively contraindicated in combination with benzodiazepines, sleep inducers, and other central nervous system depressants. Other classes of medications (i.e.: certain antibiotics and even over-the-counter medications) may also trigger or induce respiratory depression in some patients.  Medical conditions: Patients with pre-existing respiratory problems are at higher risk of respiratory failure and/or depression when in combination with opioid analgesics. Opioids are relatively contraindicated in some medical conditions such as central sleep apnea.   Fractures and Falls:  Opioids increase the risk and incidence of falls. This is of particular importance in elderly patients.  Endocrine System:  Long-term administration is associated with endocrine abnormalities (endocrinopathies). (Also known as Opioid-induced Endocrinopathy) Influences on both the hypothalamic-pituitary-adrenal axis?and the hypothalamic-pituitary-gonadal axis have been demonstrated with consequent hypogonadism and adrenal insufficiency in both sexes. Hypogonadism and decreased levels of dehydroepiandrosterone sulfate have been reported in men and women. Endocrine effects include: Amenorrhoea in women (abnormal absence of  menstruation) Reduced libido in both sexes Decreased sexual function Erectile dysfunction in men Hypogonadisms (decreased testicular function with shrinkage of testicles) Infertility Depression and fatigue Loss of muscle mass Anxiety Depression Immune suppression Hyperalgesia Weight gain Anemia Osteoporosis Patients (particularly women of childbearing age) should avoid opioids. There is insufficient evidence to recommend routine monitoring of asymptomatic patients taking opioids in the long-term for hormonal deficiencies.  Immune System: Human studies have demonstrated that opioids have an immunomodulating effect. These effects are mediated via opioid receptors both on immune effector cells and in the central nervous system. Opioids have been demonstrated to have adverse effects on antimicrobial response and anti-tumour surveillance. Buprenorphine has been demonstrated to have no impact on immune function.  Opioid Induced Hyperalgesia: Human studies have demonstrated that prolonged use of opioids can lead to a state of abnormal pain sensitivity, sometimes called opioid induced hyperalgesia (OIH). Opioid induced hyperalgesia is not usually seen in the absence of tolerance to opioid analgesia. Clinically, hyperalgesia may be diagnosed if the patient on long-term opioid therapy presents with increased pain. This might be qualitatively and anatomically distinct from pain related to disease progression or to breakthrough pain resulting from development of opioid tolerance. Pain associated with hyperalgesia tends to be more diffuse than the pre-existing pain and less defined in quality. Management of opioid induced hyperalgesia requires opioid dose reduction.  Cancer: Chronic opioid therapy has been associated with an increased risk of cancer among noncancer patients with chronic pain. This association was more evident in chronic strong opioid users. Chronic opioid consumption causes  significant pathological changes in the small intestine and colon. Epidemiological studies have found that there is a link between opium dependence and initiation of gastrointestinal cancers. Cancer is the second leading cause of death after cardiovascular disease. Chronic use of opioids can cause multiple conditions such as GERD, immunosuppression and renal damage as well as carcinogenic effects, which are associated with the incidence of cancers.   Mortality: Long-term opioid use has been associated with increased mortality among patients with chronic non-cancer pain (CNCP).  Prescription of long-acting opioids for chronic noncancer pain was associated with a significantly increased risk of all-cause mortality, including deaths from causes other than overdose.  Reference: Von Korff M, Kolodny A, Deyo RA, Chou R. Long-term opioid therapy reconsidered. Ann Intern Med. 2011 Sep 6;155(5):325-8. doi: 10.7326/[id number]. PMID: 78106373; PMCID: EFR6719914. Kit JINNY Laurence CINDERELLA Pearley JINNY, Hayward RA, Dunn KM, Jordan KP. Risk of adverse events in patients prescribed long-term opioids: A cohort study in the  [place] [employer] Eur J Pain. 2019 May;23(5):908-922. doi: 10.1002/ejp.1357. Epub 2019 Jan 31. PMID: 69379883. Colameco S, Coren JS, Ciervo CA. Continuous opioid treatment for chronic noncancer pain: a time for moderation in prescribing. Postgrad Med. 2009 Jul;121(4):61-6. doi: 10.3810/pgm.2009.07.2032. PMID: 80358728. Gigi JONELLE Shlomo MILUS Levern IVER Conny RN, [place], Blazina I, Lonell DASEN, Bougatsos C, Deyo RA. The effectiveness and risks of long-term opioid therapy for chronic pain: a systematic review for a [employer] of [employer]ways to [employer]. Ann Intern Med. 2015 Feb 17;162(4):276-86. doi: 10.7326/M14-2559. PMID: 74418742. Rory CHRISTELLA Door [facility], [place] DM. NCHS Data Brief No. 22. [place]: Centers for Disease Control and Prevention; 2009. Sep,  Increase in Fatal Poisonings Involving Opioid Analgesics in the [place] , 1999-2006. Song IA, Choi HR, Oh TK. Long-term opioid use and mortality in patients with chronic non-cancer pain: Ten-year follow-up study in [place] from 2010 through 2019. EClinicalMedicine. 2022 Jul 18;51:101558. doi: 10.1016/j.eclinm.2022.898441. PMID: 64124182; PMCID: EFR0695089. Huser, W., Schubert, T., Vogelmann, T. et al. All-cause mortality in patients with long-term opioid therapy compared with non-opioid analgesics for chronic non-cancer pain: a database study. BMC Med 18, 162 (2020). http://lester.info/ Rashidian H, Zendehdel K, Kamangar F, Malekzadeh R, Haghdoost AA. An Ecological Study of the Association between Opiate Use and Incidence of Cancers. Addict Health. 2016 Fall;8(4):252-260. PMID: 71180443; PMCID: EFR4445194.  Our Goal: Our goal is to control your pain with means other than the use of opioid pain medications.  Our Recommendation: Talk to your physician about coming off of these medications. We can assist you with the tapering down and stopping these medicines. Based on the new information, even if you cannot completely stop the medication, a decrease in the dose may be associated with a lesser risk. Ask for other means of controlling the pain. Decrease or eliminate those factors that significantly contribute to your pain such as smoking, obesity, and a diet heavily tilted towards inflammatory nutrients.  Last Updated: 11/29/2022   ______________________________________________________________________

## 2024-03-27 NOTE — Progress Notes (Signed)
 Department:  [facility]ventional [facility]) Date: 03/27/2024  Event: Canceled/Rescheduled by Provider.  Encounter Type: Procedure. (ECT):(L) L-FCT RFA #2 (Blood thinner protocol) Advance notice: N/A.  Reason: Sickness. Patient with active pneumonia on antibiotics and history significant for COPD. Note: We'll R/S. Information below obtained during the precharting review of available records. Disposition: Reschedule once cleared from pneumonia.  Post-Procedure Evaluation   Procedure: Lumbar Facet, Medial Branch Radiofrequency Ablation (RFA) #2  Laterality: Right (-RT)  Level: L2, L3, L4, L5, and S1 Medial Branch Level(s). These levels will denervate the L3-4, L4-5, and L5-S1 lumbar facet joints.  Imaging: Fluoroscopy-guided Spinal ([id number]) Anesthesia: Local anesthesia (1-2% Lidocaine ) Anxiolysis: IV Versed  2.0 mg Sedation: Minimal Sedation None required. No Fentanyl  administered.         DOS: 03/08/2024  Performed by: Eric DELENA Como, MD  Purpose: Therapeutic/Palliative Indications: Low back pain severe enough to impact quality of life or function. Indications: 1. Chronic low back pain (1ry area of Pain) (Bilateral) w/o sciatica   2. 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 Lumbar facet joint pain   4. Lumbar facet syndrome (Bilateral)   5. Lumbar facet arthropathy (L3-4, L4-5, and L5-S1) (Bilateral)   6. 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 Spondylosis without myelopathy or radiculopathy, lumbosacral region    Chronic anticoagulation (COUMADIN )    Pain Score: Pre-procedure: 10-Worst pain ever/10 Post-procedure: 0-No pain/10    Effectiveness:  Initial hour after procedure: 100 %. Subsequent 4-6 hours post-procedure: 100 %. Analgesia past initial 6 hours: 75 %.

## 2024-03-28 ENCOUNTER — Encounter: Payer: Self-pay | Admitting: Internal Medicine

## 2024-03-28 ENCOUNTER — Encounter: Payer: Self-pay | Admitting: Oncology

## 2024-03-28 NOTE — Telephone Encounter (Signed)
 Created in error, please disregard.

## 2024-03-29 ENCOUNTER — Inpatient Hospital Stay

## 2024-03-29 DIAGNOSIS — J9611 Chronic respiratory failure with hypoxia: Secondary | ICD-10-CM | POA: Diagnosis not present

## 2024-03-29 DIAGNOSIS — I509 Heart failure, unspecified: Secondary | ICD-10-CM | POA: Diagnosis not present

## 2024-04-02 ENCOUNTER — Other Ambulatory Visit: Payer: Self-pay

## 2024-04-02 ENCOUNTER — Encounter: Payer: Self-pay | Admitting: Oncology

## 2024-04-02 ENCOUNTER — Inpatient Hospital Stay: Attending: Oncology | Admitting: Oncology

## 2024-04-02 VITALS — BP 110/62 | HR 115 | Temp 98.0°F | Resp 21 | Ht 63.0 in | Wt 165.0 lb

## 2024-04-02 DIAGNOSIS — Z809 Family history of malignant neoplasm, unspecified: Secondary | ICD-10-CM | POA: Diagnosis not present

## 2024-04-02 DIAGNOSIS — S22050A Wedge compression fracture of T5-T6 vertebra, initial encounter for closed fracture: Secondary | ICD-10-CM

## 2024-04-02 DIAGNOSIS — Z79899 Other long term (current) drug therapy: Secondary | ICD-10-CM | POA: Diagnosis not present

## 2024-04-02 DIAGNOSIS — C3411 Malignant neoplasm of upper lobe, right bronchus or lung: Secondary | ICD-10-CM | POA: Diagnosis not present

## 2024-04-02 DIAGNOSIS — Z7722 Contact with and (suspected) exposure to environmental tobacco smoke (acute) (chronic): Secondary | ICD-10-CM | POA: Diagnosis not present

## 2024-04-02 DIAGNOSIS — Z803 Family history of malignant neoplasm of breast: Secondary | ICD-10-CM | POA: Insufficient documentation

## 2024-04-02 DIAGNOSIS — K521 Toxic gastroenteritis and colitis: Secondary | ICD-10-CM | POA: Insufficient documentation

## 2024-04-02 DIAGNOSIS — G8929 Other chronic pain: Secondary | ICD-10-CM | POA: Diagnosis not present

## 2024-04-02 MED ORDER — OXYCODONE HCL 5 MG PO TABS: 5.0000 mg | ORAL_TABLET | Freq: Four times a day (QID) | ORAL | 0 refills | Status: DC | PRN

## 2024-04-02 NOTE — Progress Notes (Signed)
 Hematology/Oncology Consult note [facility]  [phone] [phone]  Patient Care Team: Sadie Manna, MD as PCP - General (Internal Medicine) Verdene Gills, RN as Oncology Nurse Navigator Melanee Annah BROCKS, MD as Consulting Physician (Oncology)   Name of the patient: Kara [name]  [id number]  1950-12-10   Date of visit: 04/02/24  Diagnosis-recurrent stage III lung cancer  Chief complaint/ Reason for visit-routine follow-up of lung cancer presently on Tagrisso   Heme/Onc history:  Patient is a 73 year old female who underwent SBRT for presumed right upper lobe lung cancer in December 2022. She was in remission until November 2024 when she was noted to have hypermetabolic in the right hilum as well as mediastinal region. 15 x 12 mm nodular opacity in the dome of the right diaphragm. 10 mm left cervical lymph node. 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 Right pleural thoracentesis negative for malignancy.  Patient completed concurrent chemoradiation with weekly CarboTaxol in March 2025.  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  MSI not high.  CT DNA tumor fraction 7%   CT scans after concurrent chemoradiation showed stable disease with stable mediastinal adenopathy asWell as postradiation changes noted in the perihilar right lung.  She he is on adjuvant Tagrisso  starting May 2025.  Initially patient was on full dose 80 mg daily but reduced to 40 mg subsequently due to diarrhea  Interval history- Discussed the use of AI scribe software for clinical note transcription with the patient, who gave verbal consent to proceed.  She has a new onset of shoulder pain and was told it was a compression fracture. The pain radiates to her chest and is severe, requiring her to  lay on her side for relief. She takes hydrocodone  with Tylenol  once daily for pain management.      ECOG PS- 2 Pain scale- 5 Opioid associated constipation- no  Review of systems- Review of Systems  Constitutional:  Negative for chills, fever, malaise/fatigue and weight loss.  HENT:  Negative for congestion, ear discharge and nosebleeds.   Eyes:  Negative for blurred vision.  Respiratory:  Negative for cough, hemoptysis, sputum production, shortness of breath and wheezing.   Cardiovascular:  Negative for chest pain, palpitations, orthopnea and claudication.  Gastrointestinal:  Negative for abdominal pain, blood in stool, constipation, diarrhea, heartburn, melena, nausea and vomiting.  Genitourinary:  Negative for dysuria, flank pain, frequency, hematuria and urgency.  Musculoskeletal:  Positive for back pain. Negative for joint pain and myalgias.  Skin:  Negative for rash.  Neurological:  Negative for dizziness, tingling, focal weakness, seizures, weakness and headaches.  Endo/Heme/Allergies:  Does not bruise/bleed easily.  Psychiatric/Behavioral:  Negative for depression and suicidal ideas. The patient does not have insomnia.       Allergies  Allergen Reactions   Adhesive [Tape] Other (See Comments)    Pt reports, when removing tape from skin most tapes pull her skin off. Ok to use paper tape   Avapro  [Irbesartan ] Anaphylaxis    Facial swelling   Morphine  And Codeine  Nausea And Vomiting   Other Hives    Chlorhexadine wipes/CHG wipes,    Latex Rash    Exam gloves/ does not react around elastic and lips don't swell when blowing balloons     Past Medical History:  Diagnosis Date   Anginal pain  Arthritis    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, 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   CHF (congestive heart failure) (HCC)    followed colon resection, wouldn't let me get up   Chronic anticoagulation    Degenerative disc disease, lumbar    Diverticulitis 2018   Diverticulosis    DVT of  lower extremity, bilateral (HCC) 2018   Essential hypertension    History of being hospitalized    1 month ago for 3 days, vomiting, and kink in upper intestine   History of hiatal hernia    Mediastinal adenopathy 04/2023   MRSA nasal colonization    Non-small cell lung cancer (HCC) 2022   right   Obesity (BMI 30-39.9)    Osteoarthritis    Osteoporosis    PE (pulmonary thromboembolism) (HCC) 2018   Pneumonia    Small bowel obstruction (HCC) 03/20/2023   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HCC)    Type 2 diabetes mellitus with obesity    Vertigo      Past Surgical History:  Procedure Laterality Date   APPENDECTOMY N/A 02/24/2017   Procedure: Incidental  APPENDECTOMY;  Surgeon: Shelva Dunnings, MD;  Location: [name];  Service: General;  Laterality: N/A;   CARPAL TUNNEL RELEASE Bilateral    CATARACT EXTRACTION W/PHACO Left 11/21/2018   Procedure: CATARACT EXTRACTION PHACO AND INTRAOCULAR LENS PLACEMENT (IOC) LEFT DIABETES;  Surgeon: Jaye Fallow, MD;  Location: [facility];  Service: Ophthalmology;  Laterality: Left;  latex sensitivity Diabetic - oral meds   CATARACT EXTRACTION W/PHACO Right 12/12/2018   Procedure: CATARACT EXTRACTION PHACO AND INTRAOCULAR LENS PLACEMENT (IOC) RIGHT DIABETES;  Surgeon: Jaye Fallow, MD;  Location: [facility];  Service: Ophthalmology;  Laterality: Right;  Diabetes-oral med Latex sensitiviy   COLON RESECTION SIGMOID N/A 11/02/2016   Procedure: COLON RESECTION SIGMOID;  Surgeon: Shelva Dunnings, MD;  Location: [name];  Service: General;  Laterality: N/A;   COLON SURGERY  11/02/2016   COLONOSCOPY WITH PROPOFOL  N/A 02/03/2017   Procedure: COLONOSCOPY WITH PROPOFOL ;  Surgeon: Unk Corinn Skiff, MD;  Location: [name];  Service: Gastroenterology;  Laterality: N/A;   COLOSTOMY N/A 11/02/2016   Procedure: COLOSTOMY;  Surgeon: Shelva Dunnings, MD;  Location: [name];  Service: General;  Laterality: N/A;   COLOSTOMY  REVERSAL N/A 02/24/2017   Procedure: COLOSTOMY REVERSAL, ostomy takedown, spleenic flexure mobilization, excision rectal stump/distal sigmoid, anastomosis with suture reinforcement;  Surgeon: Shelva Dunnings, MD;  Location: [name];  Service: General;  Laterality: N/A;   ENDOBRONCHIAL ULTRASOUND N/A 05/16/2023   Procedure: ENDOBRONCHIAL ULTRASOUND;  Surgeon: Tamea Dedra CROME, MD;  Location: [name];  Service: Cardiopulmonary;  Laterality: N/A;   FLEXIBLE BRONCHOSCOPY N/A 05/16/2023   Procedure: FLEXIBLE BRONCHOSCOPY;  Surgeon: Tamea Dedra CROME, MD;  Location: [name];  Service: Cardiopulmonary;  Laterality: N/A;   ILEO LOOP DIVERSION N/A 02/24/2017   Procedure: ILEO LOOP COLOSTOMY;  Surgeon: Shelva Dunnings, MD;  Location: [name];  Service: General;  Laterality: N/A;   ILEOSTOMY CLOSURE N/A 06/01/2017   Procedure: LOOP ILEOSTOMY TAKEDOWN;  Surgeon: Shelva Dunnings, MD;  Location: [name];  Service: General;  Laterality: N/A;   IR IMAGING GUIDED PORT INSERTION  06/14/2023   IR REMOVAL TUN ACCESS W/ PORT W/O FL MOD SED  11/16/2023   IVC FILTER INSERTION Right 10/2016   IVC FILTER REMOVAL N/A 08/09/2017   Procedure: IVC FILTER REMOVAL;  Surgeon: Jama Cordella MATSU, MD;  Location: [name];  Service: Cardiovascular;  Laterality: N/A;   KNEE SURGERY Right  torn menicus   LAPAROSCOPY N/A 02/24/2017   Procedure: LAPAROSCOPY DIAGNOSTIC;  Surgeon: Shelva Dunnings, MD;  Location: [name];  Service: General;  Laterality: N/A;   LYSIS OF ADHESION N/A 02/24/2017   Procedure: LYSIS OF ADHESION;  Surgeon: Shelva Dunnings, MD;  Location: [name];  Service: General;  Laterality: N/A;   PULMONARY VENOGRAPHY N/A 11/19/2016   Procedure: Pulmonary Venography; IVC filter placement; possible pulmonary thrombectomy;  Surgeon: Jama Cordella MATSU, MD;  Location: [name];  Service: Cardiovascular;  Laterality: N/A;   SACROPLASTY N/A 11/08/2017   Procedure: SACROPLASTY S2;   Surgeon: Kathlynn Sharper, MD;  Location: [name];  Service: Orthopedics;  Laterality: N/A;   SIGMOIDOSCOPY N/A 11/13/2016   Procedure: endoscopic  flexible SIGMOIDOSCOPY;  Surgeon: Wonda Charlie BRAVO, MD;  Location: [name];  Service: General;  Laterality: N/A;   SIGMOIDOSCOPY N/A 05/19/2017   Procedure: ENID MORIN;  Surgeon: Shelva Dunnings, MD;  Location: [name];  Service: General;  Laterality: N/A;   VIDEO BRONCHOSCOPY WITH ENDOBRONCHIAL NAVIGATION N/A 02/23/2021   Procedure: ROBOTIC VIDEO BRONCHOSCOPY WITH ENDOBRONCHIAL NAVIGATION;  Surgeon: Tamea Dedra CROME, MD;  Location: [name];  Service: Pulmonary;  Laterality: N/A;    Social History   Socioeconomic History   Marital status: Married    Spouse name: Butler   Number of children: 1   Years of education: Not on file   Highest education level: Not on file  Occupational History   Not on file  Tobacco Use   Smoking status: Never    Passive exposure: Current (husband smokes)   Smokeless tobacco: Never  Vaping Use   Vaping status: Never Used  Substance and Sexual Activity   Alcohol  use: No    Alcohol /week: 0.0 standard drinks of alcohol    Drug use: No   Sexual activity: Never  Other Topics Concern   Not on file  Social History Narrative   Not on file   Social Drivers of Health   Financial Resource Strain: Low Risk  (05/19/2023)   Received from [facility]   Overall Financial Resource Strain (CARDIA)    Difficulty of Paying Living Expenses: Not hard at all  Food Insecurity: No Food Insecurity (12/12/2023)   Hunger Vital Sign    Worried About Running Out of Food in the Last Year: Never true    Ran Out of Food in the Last Year: Never true  Transportation Needs: No Transportation Needs (12/12/2023)   PRAPARE - Administrator, Civil Service (Medical): No    Lack of Transportation (Non-Medical): No  Physical Activity: Not on file  Stress: Not on file  Social Connections:  Moderately Isolated (12/12/2023)   Social Connection and Isolation Panel    Frequency of Communication with Friends and Family: More than three times a week    Frequency of Social Gatherings with Friends and Family: Once a week    Attends Religious Services: Never    Database Administrator or Organizations: No    Attends Banker Meetings: Never    Marital Status: Married  Catering Manager Violence: Not At Risk (12/12/2023)   Humiliation, Afraid, Rape, and Kick questionnaire    Fear of Current or Ex-Partner: No    Emotionally Abused: No    Physically Abused: No    Sexually Abused: No    Family History  Problem Relation Age of Onset   Diabetes Mother    Heart disease Mother    Cancer Mother    Breast  cancer Mother        >50   Heart disease Father    Diabetes Sister    Heart disease Sister      Current Outpatient Medications:    albuterol  (VENTOLIN  HFA) 108 (90 Base) MCG/ACT inhaler, Inhale 2 puffs into the lungs every 6 (six) hours as needed for wheezing or shortness of breath., Disp: 18 g, Rfl: 3   bisacodyl  (DULCOLAX) 10 MG suppository, Place 1 suppository (10 mg total) rectally as needed for moderate constipation., Disp: 12 suppository, Rfl: 0   budesonide -formoterol  (SYMBICORT ) 80-4.5 MCG/ACT inhaler, Inhale 2 puffs into the lungs 2 (two) times daily., Disp: 10.2 g, Rfl: 3   candesartan (ATACAND) 16 MG tablet, Take 16 mg by mouth in the morning., Disp: , Rfl:    cholecalciferol  (VITAMIN D ) 25 MCG (1000 UNIT) tablet, Take 1,000 Units by mouth in the morning., Disp: , Rfl:    Cyanocobalamin  (VITAMIN B-12) 5000 MCG TBDP, Take 5,000 mcg by mouth in the morning., Disp: , Rfl:    cyclobenzaprine  (FLEXERIL ) 10 MG tablet, Take 10 mg by mouth at bedtime., Disp: , Rfl:    Dulaglutide (TRULICITY) 1.5 MG/0.5ML SOAJ, Inject 1.5 mg into the skin once a week. Saturday, Disp: , Rfl:    gabapentin  (NEURONTIN ) 100 MG capsule, Take 200 mg by mouth with breakfast, with lunch, and with  evening meal., Disp: , Rfl:    gabapentin  (NEURONTIN ) 300 MG capsule, Take 300 mg by mouth at bedtime., Disp: , Rfl:    glipiZIDE  (GLUCOTROL ) 10 MG tablet, Take 10 mg by mouth daily before breakfast., Disp: , Rfl:    Magnesium  Oxide -Mg Supplement 500 MG TABS, Take 1 tablet by mouth at bedtime., Disp: , Rfl:    omeprazole  (PRILOSEC) 20 MG capsule, Take 1 capsule (20 mg total) by mouth daily., Disp: 90 capsule, Rfl: 0   ondansetron  (ZOFRAN ) 8 MG tablet, TAKE 1 TABLET BY MOUTH EVERY 8 HOURS AS NEEDED FOR NAUSEA OR VOMITING. START ON THE 3RD DAY AFTER CHEMOTHERAPY, Disp: 30 tablet, Rfl: 1   ONETOUCH ULTRA TEST test strip, , Disp: , Rfl:    osimertinib  mesylate (TAGRISSO ) 40 MG tablet, Take 1 tablet (40 mg total) by mouth daily., Disp: 30 tablet, Rfl: 3   oxybutynin  (DITROPAN ) 5 MG tablet, Take 1 tablet by mouth 2 (two) times daily., Disp: , Rfl:    oxyCODONE  (OXY IR/ROXICODONE ) 5 MG immediate release tablet, Take 1 tablet (5 mg total) by mouth every 6 (six) hours as needed for severe pain (pain score 7-10)., Disp: 90 tablet, Rfl: 0   OXYGEN, Inhale 2 L into the lungs., Disp: , Rfl:    prochlorperazine  (COMPAZINE ) 10 MG tablet, Take 1 tablet (10 mg total) by mouth every 6 (six) hours as needed for nausea or vomiting., Disp: 90 tablet, Rfl: 0   rOPINIRole  (REQUIP ) 1 MG tablet, Take 1 mg by mouth at bedtime., Disp: , Rfl:    sucralfate  (CARAFATE ) 1 g tablet, Take 1 tablet (1 g total) by mouth 3 (three) times daily with meals. Dissolve tablet in 4 tablesppons warm water swish and swallow 30 min before meals. (Patient taking differently: Take 1 g by mouth 3 (three) times daily with meals. Dissolve tablet in 4 tablespoons warm water swish and swallow 30 min before meals.), Disp: 90 tablet, Rfl: 0   warfarin (COUMADIN ) 5 MG tablet, Take 5 mg by mouth daily., Disp: , Rfl:   Physical exam:  Vitals:   04/02/24 0942  BP: 110/62  Pulse: ROLLEN)  115  Resp: (!) 21  Temp: 98 F (36.7 C)  TempSrc: Tympanic  SpO2:  97%  Weight: 165 lb (74.8 kg)  Height: 5' 3 (1.6 m)   Physical Exam Constitutional:      Comments: Sitting in a wheelchair.  Appears in no acute distress  Cardiovascular:     Rate and Rhythm: Normal rate and regular rhythm.     Heart sounds: Normal heart sounds.  Pulmonary:     Effort: Pulmonary effort is normal.     Breath sounds: Normal breath sounds.  Abdominal:     General: Bowel sounds are normal.     Palpations: Abdomen is soft.  Skin:    General: Skin is warm and dry.  Neurological:     Mental Status: She is alert and oriented to person, place, and time.      I have personally reviewed labs listed below:    Latest Ref Rng & Units 03/09/2024    9:24 AM  CMP  Glucose 70 - 99 mg/dL 756   BUN 8 - 23 mg/dL 14   Creatinine 9.55 - 1.00 mg/dL 9.17   Sodium 864 - 854 mmol/L 137   Potassium 3.5 - 5.1 mmol/L 4.4   Chloride 98 - 111 mmol/L 104   CO2 22 - 32 mmol/L 25   Calcium  8.9 - 10.3 mg/dL 9.0   Total Protein 6.5 - 8.1 g/dL 8.2   Total Bilirubin 0.0 - 1.2 mg/dL 0.3   Alkaline Phos 38 - 126 U/L 67   AST 15 - 41 U/L 19   ALT 0 - 44 U/L 10       Latest Ref Rng & Units 03/09/2024    9:24 AM  CBC  WBC 4.0 - 10.5 K/uL 5.2   Hemoglobin 12.0 - 15.0 g/dL 88.1   Hematocrit 63.9 - 46.0 % 35.8   Platelets 150 - 400 K/uL 162    I have personally reviewed Radiology images listed below: No images are attached to the encounter.  CT CHEST W CONTRAST Result Date: 03/21/2024 EXAM: CT CHEST WITH CONTRAST 03/21/2024 10:11:40 AM TECHNIQUE: CT of the chest was performed with the administration of 80 mL of iohexol  (OMNIPAQUE ) 300 MG/ML solution. Multiplanar reformatted images are provided for review. Automated exposure control, iterative reconstruction, and/or weight based adjustment of the mA/kV was utilized to reduce the radiation dose to as low as reasonably achievable. COMPARISON: 12/11/2023 CLINICAL HISTORY: Malignant neoplasm of lung (HCC) and bronchitis. FINDINGS: MEDIASTINUM:  Myocardiomegaly. Thoracic aortic, coronary artery, and branch vessel atheromatous vascular calcifications. No acute pulmonary embolism is identified. 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 Pericardium is unremarkable. The central airways are clear. LYMPH NODES: No mediastinal, hilar or axillary lymphadenopathy. LUNGS AND PLEURA: Bandlike consolidation in the right upper lobe and superior segment right lower lobe with some associated volume loss and associated airway plugging in the superior segment right lower lobe. The consolidation appears reduced in volume but more confluent compared to 12/11/2023. This may well be the result of prior radiation therapy. There are some patchy ground glass opacities in the right upper lobe suggesting alveolitis. A well-defined mass separate from the band of consolidation is not well seen. Mild peripheral bandlike and ground glass opacities in the left upper lobe and left lower lobe, improved from previous. 3 mm left upper lobe nodule shown on image 57 series 4, stable. Trace right pleural effusion with right pleural thickening and  some pleural enhancement and mild pleural nodularity along the diaphragm posteriorly on the right. Pleural metastatic disease not excluded. Prior left pleural effusion has resolved. No pneumothorax. No pulmonary edema. SOFT TISSUES/BONES: Thoracic spondylosis. New 40 percent superior endplate compression fracture at the T5 vertebral level. No acute abnormality of the soft tissues. UPPER ABDOMEN: Limited images of the upper abdomen demonstrate cholelithiasis. IMPRESSION: 1. No acute pulmonary embolism. 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 2. Bandlike consolidation in the right upper lobe and superior segment right lower lobe with associated volume loss and airway plugging, possibly related to prior radiation therapy. Patchy  ground glass opacities in the right upper lobe suggest alveolitis. No well-defined separate mass is identified. 3. Trace right pleural effusion with right pleural thickening and enhancement, with mild right diaphragmatic pleural nodularity; pleural metastatic disease is not excluded although the the region was not significantly hypermetabolic on 01/13/2024 PET-CT. 4. Mild peripheral bandlike and ground glass opacities in the left upper and left lower lobes, improved from prior study. 5. Stable 3 mm left upper lobe pulmonary nodule; no routine follow-up imaging is recommended per Fleischner Society Guidelines. 6. New approximately 40% superior endplate compression fracture at T5. 7. Thoracic spondylosis. Electronically signed by: Ryan Salvage MD 03/21/2024 10:43 AM EDT RP Workstation: [id number]   DG PAIN CLINIC C-ARM 1-60 MIN NO REPORT Result Date: 03/08/2024 Fluoro was used, but no Radiologist interpretation will be provided. Please refer to NOTES tab for provider progress note.    Assessment and plan- Patient is a 73 y.o. female  with history of stage I right upper lobe lung cancer s/p SBRT in the past. She had biopsy-proven mediastinal recurrence in December 2024.  She is s/p concurrent chemoradiation and presently on Tagrisso  here for routine follow-up  Assessment and Plan    Lung cancer with EGFR mutation on Tagrisso  -I have reviewed CT chest images independently and discussed findings with the patient Lung cancer with EGFR mutation, stable on Tagrisso  40 mg. CT shows post-radiation fibrosis and pleural spots of unclear significance. August PET scan showed no significant activity. No evidence of progression. - Continue Tagrisso  40 mg daily.  I have asked her to increase the dose to 80 mg daily after Thanksgiving and see if she will be able and able to tolerate the full dose but if she is unable to then she can go back to 40 mg dosing - Plan PET scan in December for comparison with  August. I did speak to Dr. Tamea over the phone today to discuss her CT findings and she is in agreement with the plan to get a PET scan 2 months before considering bronchoscopy at this time  Compression fracture of vertebra with chronic pain Compression fracture causing significant non-cancer-related pain, radiating to chest. Current hydrocodone  regimen insufficient. - Switch to oxycodone  5 mg every six hours as needed. - Continue Tylenol  for pain management. - Consult interventional radiology for potential kyphoplasty.  Diarrhea secondary to Tagrisso  (osimertinib ) Diarrhea resolved with Tagrisso  dose reduction to 40 mg. No current issues. - Continue Tagrisso  40 mg daily. - Consider increasing to 80 mg post-Thanksgiving if diarrhea remains absent.         Visit Diagnosis 1. Cancer of upper lobe of right lung (HCC)   2. High risk medication use      Dr. Annah Skene, MD, MPH [facility] at [facility] [phone] 04/02/2024 12:37 PM

## 2024-04-02 NOTE — Progress Notes (Signed)
 Patient is having some back issues that started roughly 3-4 weeks ago. She also has some questions she would like address.

## 2024-04-03 ENCOUNTER — Encounter: Payer: Self-pay | Admitting: Internal Medicine

## 2024-04-03 ENCOUNTER — Telehealth: Payer: Self-pay

## 2024-04-03 ENCOUNTER — Other Ambulatory Visit: Payer: Self-pay | Admitting: *Deleted

## 2024-04-03 ENCOUNTER — Encounter: Payer: Self-pay | Admitting: *Deleted

## 2024-04-03 ENCOUNTER — Ambulatory Visit: Admitting: Internal Medicine

## 2024-04-03 ENCOUNTER — Other Ambulatory Visit: Payer: Self-pay

## 2024-04-03 VITALS — BP 100/79 | HR 102 | Temp 97.8°F | Resp 16 | Ht 63.0 in | Wt 165.0 lb

## 2024-04-03 DIAGNOSIS — Z85118 Personal history of other malignant neoplasm of bronchus and lung: Secondary | ICD-10-CM

## 2024-04-03 DIAGNOSIS — J4489 Other specified chronic obstructive pulmonary disease: Secondary | ICD-10-CM

## 2024-04-03 DIAGNOSIS — J9611 Chronic respiratory failure with hypoxia: Secondary | ICD-10-CM

## 2024-04-03 DIAGNOSIS — M546 Pain in thoracic spine: Secondary | ICD-10-CM

## 2024-04-03 DIAGNOSIS — J189 Pneumonia, unspecified organism: Secondary | ICD-10-CM

## 2024-04-03 MED ORDER — HYDROCODONE BIT-HOMATROP MBR 5-1.5 MG/5ML PO SOLN: 5.0000 mL | Freq: Three times a day (TID) | ORAL | 0 refills | Status: AC | PRN

## 2024-04-03 MED ORDER — AZITHROMYCIN 250 MG PO TABS: ORAL_TABLET | ORAL | 0 refills | Status: AC

## 2024-04-03 MED ORDER — BUDESONIDE-FORMOTEROL FUMARATE 80-4.5 MCG/ACT IN AERO: 2.0000 | INHALATION_SPRAY | Freq: Two times a day (BID) | RESPIRATORY_TRACT | 3 refills | Status: DC

## 2024-04-03 MED ORDER — BUDESONIDE-FORMOTEROL FUMARATE 80-4.5 MCG/ACT IN AERO: 2.0000 | INHALATION_SPRAY | Freq: Two times a day (BID) | RESPIRATORY_TRACT | 3 refills | Status: AC

## 2024-04-03 MED ORDER — MOMETASONE FURO-FORMOTEROL FUM 100-5 MCG/ACT IN AERO: 2.0000 | INHALATION_SPRAY | Freq: Two times a day (BID) | RESPIRATORY_TRACT | 3 refills | Status: AC

## 2024-04-03 NOTE — Telephone Encounter (Signed)
 Kara [name] will follow up with patient.

## 2024-04-03 NOTE — Progress Notes (Signed)
 Per Dr. Melanee, pt will need further workup for back pain and possible compression fracture of the T5. Spoke with Dr. Hughes in IR who recommended proceeding with MRI of the thoracic spine w/wo contrast and follow up with him after MRI to discuss kyphoplasty. Order placed and will coordinate appts.

## 2024-04-03 NOTE — Patient Instructions (Signed)
 Chronic Obstructive Pulmonary Disease  Chronic obstructive pulmonary disease (COPD) is a long-term (chronic) lung problem. When you have COPD, it can feel harder to breathe in or out. The condition may get worse over time. There are things you can do to keep yourself as healthy as possible. What are the causes? Smoking. This is the most common cause. Breathing in fumes, smoke, or chemicals for a long time. Genes that are inherited, which means they are passed down from parent to child. What are the signs or symptoms? Shortness of breath. This may happen all the time. This may get worse when you move your body. This may get worse over time. You may have times when this becomes much worse all of a sudden. These are called flare-ups or exacerbations. A long-term cough, with or without thick mucus. Wheezing. Chest tightness. Feeling tired. Not being able to do activities like you used to do. How is this diagnosed? This condition is diagnosed based on: Your medical history. A physical exam. Lung (pulmonary) function tests. You may have a test that measures the air flow out of the lungs when you breathe out. You may also have tests, including: Chest X-ray. CT scan. Blood tests. How is this treated? This condition may be treated by: Quitting smoking, if you smoke. Using oxygen . Taking medicines. These may include: Inhalers. These have medicines in them that you breathe in. Daily inhalers. These help to prevent symptoms from happening. They are usually taken every day to prevent COPD flare-ups. Quick relief inhalers. These act fast to relieve symptoms. They are used only when needed and provide short-term relief. Other medicines that you breathe in or swallow. These may be used to open the airways, thin mucus, or treat infections. Breathing exercises to help you control or catch your breath. A mucus clearing device, if you have a lot of thick mucus. Pulmonary rehab. A place where you  will learn about your condition and the best ways for you to manage it. Surgery. Follow these instructions at home: Medicines Take your medicines as told by your health care provider. Talk to your provider before taking any cough or allergy medicines. You may need to avoid medicines that cause your lungs to be dry. Lifestyle Several times a day, wash your hands with soap and water for at least 20 seconds. If you cannot use soap and water, use hand sanitizer. This may help keep you from getting an infection. Avoid being around crowds or people who are sick. Do not smoke or use any products that contain nicotine or tobacco. If you need help quitting, ask your provider. Stay active. Learn how to pace your activity during the day. Learn how to breathe to control your stress and catch your breath. Drink enough fluid to keep your pee (urine) pale yellow, unless you have been told not to. Eat healthy foods. Eat smaller meals more often. Get enough sleep. Most adults need 7 or more hours per night. General instructions Make a COPD action plan with your provider. This helps you to know what to do if you feel worse than usual. Make sure you get all the shots, also called vaccines, that your provider recommends. Ask your provider about a flu shot and a pneumonia shot. If you need home oxygen  therapy, ask your provider how often to check your oxygen  level with a device called an oximeter. Keep all follow-up visits to review your COPD action plan. Your provider will want to check on your condition often to keep  you healthy and out of the hospital. Contact a health care provider if: You are coughing up more mucus than usual. There is a change in the color or thickness of the mucus. It is harder to breathe than usual or you are short of breath while you are resting. You need to use your quick relief inhaler more often. You have trouble doing your normal activities such as getting dressed or walking in  the house. Your skin color or fingernails turn blue. You have a fever or chills. Get help right away if: You are short of breath and cannot: Talk in full sentences. Do normal activities. You have chest pain. You feel confused. These symptoms may be an emergency. Call 911 right away. Do not wait to see if the symptoms will go away. Do not drive yourself to the hospital. This information is not intended to replace advice given to you by your health care provider. Make sure you discuss any questions you have with your health care provider. Document Revised: 02/10/2023 Document Reviewed: 07/26/2022 Elsevier Patient Education  2024 [employer].

## 2024-04-03 NOTE — Progress Notes (Signed)
 [facility] [address] [place], [place] [address]  Pulmonary Sleep Medicine   Office Visit Note  Patient Name: Kara [name] DOB: Apr 10, 1951 MRN [id number]  Date of Service: 04/03/2024  Complaints/HPI: She was in the hospital for acute copd pneumonia. She was discharged home but states she has been coughing and as a result she states that she has developed a disc problem. She states she may need to have a kyphoplasty. Patient also is on pain meds for the pain. Her breathing is doing ok has waxing and waning status. Her lung cancer has been in remission apparently being followed by oncology  Office Spirometry Results:     ROS  General: (-) fever, (-) chills, (-) night sweats, (-) weakness Skin: (-) rashes, (-) itching,. Eyes: (-) visual changes, (-) redness, (-) itching. Nose and Sinuses: (-) nasal stuffiness or itchiness, (-) postnasal drip, (-) nosebleeds, (-) sinus trouble. Mouth and Throat: (-) sore throat, (-) hoarseness. Neck: (-) swollen glands, (-) enlarged thyroid , (-) neck pain. Respiratory: + cough, (-) bloody sputum, + shortness of breath, - wheezing. Cardiovascular: - ankle swelling, (-) chest pain. Lymphatic: (-) lymph node enlargement. Neurologic: (-) numbness, (-) tingling. Psychiatric: (-) anxiety, (-) depression   Current Medication: Outpatient Encounter Medications as of 04/03/2024  Medication Sig Note   albuterol  (VENTOLIN  HFA) 108 (90 Base) MCG/ACT inhaler Inhale 2 puffs into the lungs every 6 (six) hours as needed for wheezing or shortness of breath.    bisacodyl  (DULCOLAX) 10 MG suppository Place 1 suppository (10 mg total) rectally as needed for moderate constipation. 08/02/2023: prn   budesonide -formoterol  (SYMBICORT ) 80-4.5 MCG/ACT inhaler Inhale 2 puffs into the lungs 2 (two) times daily.    candesartan (ATACAND) 16 MG tablet Take 16 mg by mouth in the morning.    cholecalciferol  (VITAMIN D ) 25 MCG (1000 UNIT) tablet Take 1,000  Units by mouth in the morning.    Cyanocobalamin  (VITAMIN B-12) 5000 MCG TBDP Take 5,000 mcg by mouth in the morning.    cyclobenzaprine  (FLEXERIL ) 10 MG tablet Take 10 mg by mouth at bedtime.    Dulaglutide (TRULICITY) 1.5 MG/0.5ML SOAJ Inject 1.5 mg into the skin once a week. Saturday 08/08/2023: Pt takes on saturday   gabapentin  (NEURONTIN ) 100 MG capsule Take 200 mg by mouth with breakfast, with lunch, and with evening meal.    gabapentin  (NEURONTIN ) 300 MG capsule Take 300 mg by mouth at bedtime.    glipiZIDE  (GLUCOTROL ) 10 MG tablet Take 10 mg by mouth daily before breakfast.    Magnesium  Oxide -Mg Supplement 500 MG TABS Take 1 tablet by mouth at bedtime.    omeprazole  (PRILOSEC) 20 MG capsule Take 1 capsule (20 mg total) by mouth daily.    ondansetron  (ZOFRAN ) 8 MG tablet TAKE 1 TABLET BY MOUTH EVERY 8 HOURS AS NEEDED FOR NAUSEA OR VOMITING. START ON THE 3RD DAY AFTER CHEMOTHERAPY    ONETOUCH ULTRA TEST test strip     osimertinib  mesylate (TAGRISSO ) 40 MG tablet Take 1 tablet (40 mg total) by mouth daily.    oxybutynin  (DITROPAN ) 5 MG tablet Take 1 tablet by mouth 2 (two) times daily.    oxyCODONE  (OXY IR/ROXICODONE ) 5 MG immediate release tablet Take 1 tablet (5 mg total) by mouth every 6 (six) hours as needed for severe pain (pain score 7-10).    OXYGEN  Inhale 2 L into the lungs.    prochlorperazine  (COMPAZINE ) 10 MG tablet Take 1 tablet (10 mg total) by mouth every 6 (six) hours  as needed for nausea or vomiting.    rOPINIRole  (REQUIP ) 1 MG tablet Take 1 mg by mouth at bedtime.    sucralfate  (CARAFATE ) 1 g tablet Take 1 tablet (1 g total) by mouth 3 (three) times daily with meals. Dissolve tablet in 4 tablesppons warm water swish and swallow 30 min before meals. (Patient taking differently: Take 1 g by mouth 3 (three) times daily with meals. Dissolve tablet in 4 tablespoons warm water swish and swallow 30 min before meals.)    warfarin (COUMADIN ) 5 MG tablet Take 5 mg by mouth daily.  12/08/2023: Pt confirmed now taking 5mg  every morning   No facility-administered encounter medications on file as of 04/03/2024.    Surgical History: Past Surgical History:  Procedure Laterality Date   APPENDECTOMY N/A 02/24/2017   Procedure: Incidental  APPENDECTOMY;  Surgeon: Shelva Dunnings, MD;  Location: [name];  Service: General;  Laterality: N/A;   CARPAL TUNNEL RELEASE Bilateral    CATARACT EXTRACTION W/PHACO Left 11/21/2018   Procedure: CATARACT EXTRACTION PHACO AND INTRAOCULAR LENS PLACEMENT (IOC) LEFT DIABETES;  Surgeon: Jaye Fallow, MD;  Location: [facility];  Service: Ophthalmology;  Laterality: Left;  latex sensitivity Diabetic - oral meds   CATARACT EXTRACTION W/PHACO Right 12/12/2018   Procedure: CATARACT EXTRACTION PHACO AND INTRAOCULAR LENS PLACEMENT (IOC) RIGHT DIABETES;  Surgeon: Jaye Fallow, MD;  Location: [facility];  Service: Ophthalmology;  Laterality: Right;  Diabetes-oral med Latex sensitiviy   COLON RESECTION SIGMOID N/A 11/02/2016   Procedure: COLON RESECTION SIGMOID;  Surgeon: Shelva Dunnings, MD;  Location: [name];  Service: General;  Laterality: N/A;   COLON SURGERY  11/02/2016   COLONOSCOPY WITH PROPOFOL  N/A 02/03/2017   Procedure: COLONOSCOPY WITH PROPOFOL ;  Surgeon: Unk Corinn Skiff, MD;  Location: [name];  Service: Gastroenterology;  Laterality: N/A;   COLOSTOMY N/A 11/02/2016   Procedure: COLOSTOMY;  Surgeon: Shelva Dunnings, MD;  Location: [name];  Service: General;  Laterality: N/A;   COLOSTOMY REVERSAL N/A 02/24/2017   Procedure: COLOSTOMY REVERSAL, ostomy takedown, spleenic flexure mobilization, excision rectal stump/distal sigmoid, anastomosis with suture reinforcement;  Surgeon: Shelva Dunnings, MD;  Location: [name];  Service: General;  Laterality: N/A;   ENDOBRONCHIAL ULTRASOUND N/A 05/16/2023   Procedure: ENDOBRONCHIAL ULTRASOUND;  Surgeon: Tamea Dedra CROME, MD;  Location: [name];  Service:  Cardiopulmonary;  Laterality: N/A;   FLEXIBLE BRONCHOSCOPY N/A 05/16/2023   Procedure: FLEXIBLE BRONCHOSCOPY;  Surgeon: Tamea Dedra CROME, MD;  Location: [name];  Service: Cardiopulmonary;  Laterality: N/A;   ILEO LOOP DIVERSION N/A 02/24/2017   Procedure: ILEO LOOP COLOSTOMY;  Surgeon: Shelva Dunnings, MD;  Location: [name];  Service: General;  Laterality: N/A;   ILEOSTOMY CLOSURE N/A 06/01/2017   Procedure: LOOP ILEOSTOMY TAKEDOWN;  Surgeon: Shelva Dunnings, MD;  Location: [name];  Service: General;  Laterality: N/A;   IR IMAGING GUIDED PORT INSERTION  06/14/2023   IR REMOVAL TUN ACCESS W/ PORT W/O FL MOD SED  11/16/2023   IVC FILTER INSERTION Right 10/2016   IVC FILTER REMOVAL N/A 08/09/2017   Procedure: IVC FILTER REMOVAL;  Surgeon: Jama Cordella MATSU, MD;  Location: [name];  Service: Cardiovascular;  Laterality: N/A;   KNEE SURGERY Right    torn menicus   LAPAROSCOPY N/A 02/24/2017   Procedure: LAPAROSCOPY DIAGNOSTIC;  Surgeon: Shelva Dunnings, MD;  Location: [name];  Service: General;  Laterality: N/A;   LYSIS OF ADHESION N/A 02/24/2017   Procedure: LYSIS OF ADHESION;  Surgeon: Shelva Dunnings, MD;  Location:  [name];  Service: General;  Laterality: N/A;   PULMONARY VENOGRAPHY N/A 11/19/2016   Procedure: Pulmonary Venography; IVC filter placement; possible pulmonary thrombectomy;  Surgeon: Jama Cordella MATSU, MD;  Location: [name];  Service: Cardiovascular;  Laterality: N/A;   SACROPLASTY N/A 11/08/2017   Procedure: SACROPLASTY S2;  Surgeon: Kathlynn Sharper, MD;  Location: [name];  Service: Orthopedics;  Laterality: N/A;   SIGMOIDOSCOPY N/A 11/13/2016   Procedure: endoscopic  flexible SIGMOIDOSCOPY;  Surgeon: Wonda Charlie BRAVO, MD;  Location: [name];  Service: General;  Laterality: N/A;   SIGMOIDOSCOPY N/A 05/19/2017   Procedure: ENID MORIN;  Surgeon: Shelva Dunnings, MD;  Location: [name];  Service: General;  Laterality: N/A;    VIDEO BRONCHOSCOPY WITH ENDOBRONCHIAL NAVIGATION N/A 02/23/2021   Procedure: ROBOTIC VIDEO BRONCHOSCOPY WITH ENDOBRONCHIAL NAVIGATION;  Surgeon: Tamea Dedra CROME, MD;  Location: [name];  Service: Pulmonary;  Laterality: N/A;    Medical History: Past Medical History:  Diagnosis Date   Anginal pain    Arthritis    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, 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   CHF (congestive heart failure) (HCC)    followed colon resection, wouldn't let me get up   Chronic anticoagulation    Degenerative disc disease, lumbar    Diverticulitis 2018   Diverticulosis    DVT of lower extremity, bilateral (HCC) 2018   Essential hypertension    History of being hospitalized    1 month ago for 3 days, vomiting, and kink in upper intestine   History of hiatal hernia    Mediastinal adenopathy 04/2023   MRSA nasal colonization    Non-small cell lung cancer (HCC) 2022   right   Obesity (BMI 30-39.9)    Osteoarthritis    Osteoporosis    PE (pulmonary thromboembolism) (HCC) 2018   Pneumonia    Small bowel obstruction (HCC) 03/20/2023   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HCC)    Type 2 diabetes mellitus with obesity    Vertigo     Family History: Family History  Problem Relation Age of Onset   Diabetes Mother    Heart disease Mother    Cancer Mother    Breast cancer Mother        >50   Heart disease Father    Diabetes Sister    Heart disease Sister     Social History: Social History   Socioeconomic History   Marital status: Married    Spouse name: Butler   Number of children: 1   Years of education: Not on file   Highest education level: Not on file  Occupational History   Not on file  Tobacco Use   Smoking status: Never    Passive exposure: Current (husband smokes)   Smokeless tobacco: Never  Vaping Use   Vaping status: Never Used  Substance and Sexual Activity   Alcohol  use: No    Alcohol /week: 0.0 standard drinks of alcohol    Drug use: No   Sexual  activity: Never  Other Topics Concern   Not on file  Social History Narrative   Not on file   Social Drivers of Health   Financial Resource Strain: Low Risk  (05/19/2023)   Received from [facility]   Overall Financial Resource Strain (CARDIA)    Difficulty of Paying Living Expenses: Not hard at all  Food Insecurity: No Food Insecurity (12/12/2023)   Hunger Vital Sign    Worried About Running Out of Food in the Last Year: Never  true    Ran Out of Food in the Last Year: Never true  Transportation Needs: No Transportation Needs (12/12/2023)   PRAPARE - Administrator, Civil Service (Medical): No    Lack of Transportation (Non-Medical): No  Physical Activity: Not on file  Stress: Not on file  Social Connections: Moderately Isolated (12/12/2023)   Social Connection and Isolation Panel    Frequency of Communication with Friends and Family: More than three times a week    Frequency of Social Gatherings with Friends and Family: Once a week    Attends Religious Services: Never    Database Administrator or Organizations: No    Attends Banker Meetings: Never    Marital Status: Married  Catering Manager Violence: Not At Risk (12/12/2023)   Humiliation, Afraid, Rape, and Kick questionnaire    Fear of Current or Ex-Partner: No    Emotionally Abused: No    Physically Abused: No    Sexually Abused: No    Vital Signs: Blood pressure 100/79, pulse (!) 102, temperature 97.8 F (36.6 C), resp. rate 16, height 5' 3 (1.6 m), weight 165 lb (74.8 kg), SpO2 91%.  Examination: General Appearance: The patient is well-developed, well-nourished, and in no distress. Skin: Gross inspection of skin unremarkable. Head: normocephalic, no gross deformities. Eyes: no gross deformities noted. ENT: ears appear grossly normal no exudates. Neck: Supple. No thyromegaly. No LAD. Respiratory: few rhonchi noted. Crackle on the left Cardiovascular: Normal S1 and S2  without murmur or rub. Extremities: No cyanosis. pulses are equal. Neurologic: Alert and oriented. No involuntary movements.  LABS: Recent Results (from the past 2160 hours)  Glucose, capillary     Status: Abnormal   Collection Time: 01/13/24  1:30 PM  Result Value Ref Range   Glucose-Capillary 170 (H) 70 - 99 mg/dL    Comment: Glucose reference range applies only to samples taken after fasting for at least 8 hours.  CMP (Cancer Center only)     Status: Abnormal   Collection Time: 01/25/24  2:59 PM  Result Value Ref Range   Sodium 136 135 - 145 mmol/L   Potassium 3.6 3.5 - 5.1 mmol/L   Chloride 102 98 - 111 mmol/L   CO2 26 22 - 32 mmol/L   Glucose, Bld 135 (H) 70 - 99 mg/dL    Comment: Glucose reference range applies only to samples taken after fasting for at least 8 hours.   BUN 9 8 - 23 mg/dL   Creatinine 9.14 9.55 - 1.00 mg/dL   Calcium  9.0 8.9 - 10.3 mg/dL   Total Protein 7.2 6.5 - 8.1 g/dL   Albumin 3.3 (L) 3.5 - 5.0 g/dL   AST 20 15 - 41 U/L   ALT 8 0 - 44 U/L   Alkaline Phosphatase 39 38 - 126 U/L   Total Bilirubin 0.4 0.0 - 1.2 mg/dL   GFR, Estimated >39 >39 mL/min    Comment: (NOTE) Calculated using the CKD-EPI Creatinine Equation (2021)    Anion gap 8 5 - 15    Comment: Performed at [facility], [address] [place], [place] [address]  CBC with Differential (Cancer Center Only)     Status: Abnormal   Collection Time: 01/25/24  2:59 PM  Result Value Ref Range   WBC Count 3.6 (L) 4.0 - 10.5 K/uL   RBC 3.79 (L) 3.87 - 5.11 MIL/uL   Hemoglobin 11.3 (L) 12.0 - 15.0 g/dL   HCT 64.3 (L) 63.9 -  46.0 %   MCV 93.9 80.0 - 100.0 fL   MCH 29.8 26.0 - 34.0 pg   MCHC 31.7 30.0 - 36.0 g/dL   RDW 85.6 88.4 - 84.4 %   Platelet Count 78 (L) 150 - 400 K/uL   nRBC 0.0 0.0 - 0.2 %   Neutrophils Relative % 70 %   Neutro Abs 2.5 1.7 - 7.7 K/uL   Lymphocytes Relative 17 %   Lymphs Abs 0.6 (L) 0.7 - 4.0 K/uL   Monocytes Relative 11 %   Monocytes Absolute 0.4 0.1 - 1.0  K/uL   Eosinophils Relative 2 %   Eosinophils Absolute 0.1 0.0 - 0.5 K/uL   Basophils Relative 0 %   Basophils Absolute 0.0 0.0 - 0.1 K/uL   Immature Granulocytes 0 %   Abs Immature Granulocytes 0.01 0.00 - 0.07 K/uL    Comment: Performed at [facility], [address] [place], [place] [address]  Basic Metabolic Panel - Cancer Center Only     Status: Abnormal   Collection Time: 01/30/24  2:55 PM  Result Value Ref Range   Sodium 135 135 - 145 mmol/L   Potassium 3.8 3.5 - 5.1 mmol/L   Chloride 102 98 - 111 mmol/L   CO2 24 22 - 32 mmol/L   Glucose, Bld 174 (H) 70 - 99 mg/dL    Comment: Glucose reference range applies only to samples taken after fasting for at least 8 hours.   BUN 9 8 - 23 mg/dL   Creatinine 9.13 9.55 - 1.00 mg/dL   Calcium  8.8 (L) 8.9 - 10.3 mg/dL   GFR, Estimated >39 >39 mL/min    Comment: (NOTE) Calculated using the CKD-EPI Creatinine Equation (2021)    Anion gap 9 5 - 15    Comment: Performed at [facility], [address] [place], [place] [address]  CBC with Differential (Cancer Center Only)     Status: Abnormal   Collection Time: 01/30/24  2:55 PM  Result Value Ref Range   WBC Count 4.2 4.0 - 10.5 K/uL   RBC 3.83 (L) 3.87 - 5.11 MIL/uL   Hemoglobin 11.5 (L) 12.0 - 15.0 g/dL   HCT 63.9 63.9 - 53.9 %   MCV 94.0 80.0 - 100.0 fL   MCH 30.0 26.0 - 34.0 pg   MCHC 31.9 30.0 - 36.0 g/dL   RDW 85.6 88.4 - 84.4 %   Platelet Count 94 (L) 150 - 400 K/uL   nRBC 0.0 0.0 - 0.2 %   Neutrophils Relative % 69 %   Neutro Abs 2.9 1.7 - 7.7 K/uL   Lymphocytes Relative 16 %   Lymphs Abs 0.7 0.7 - 4.0 K/uL   Monocytes Relative 12 %   Monocytes Absolute 0.5 0.1 - 1.0 K/uL   Eosinophils Relative 2 %   Eosinophils Absolute 0.1 0.0 - 0.5 K/uL   Basophils Relative 0 %   Basophils Absolute 0.0 0.0 - 0.1 K/uL   Immature Granulocytes 1 %   Abs Immature Granulocytes 0.02 0.00 - 0.07 K/uL    Comment: Performed at [facility], [address]  [place], [place] [address]  CBC with Differential (Cancer Center Only)     Status: Abnormal   Collection Time: 03/09/24  9:24 AM  Result Value Ref Range   WBC Count 5.2 4.0 - 10.5 K/uL   RBC 3.96 3.87 - 5.11 MIL/uL   Hemoglobin 11.8 (L) 12.0 - 15.0 g/dL   HCT 64.1 (L) 63.9 - 53.9 %  MCV 90.4 80.0 - 100.0 fL   MCH 29.8 26.0 - 34.0 pg   MCHC 33.0 30.0 - 36.0 g/dL   RDW 86.2 88.4 - 84.4 %   Platelet Count 162 150 - 400 K/uL   nRBC 0.0 0.0 - 0.2 %   Neutrophils Relative % 85 %   Neutro Abs 4.4 1.7 - 7.7 K/uL   Lymphocytes Relative 10 %   Lymphs Abs 0.5 (L) 0.7 - 4.0 K/uL   Monocytes Relative 5 %   Monocytes Absolute 0.3 0.1 - 1.0 K/uL   Eosinophils Relative 0 %   Eosinophils Absolute 0.0 0.0 - 0.5 K/uL   Basophils Relative 0 %   Basophils Absolute 0.0 0.0 - 0.1 K/uL   Immature Granulocytes 0 %   Abs Immature Granulocytes 0.02 0.00 - 0.07 K/uL    Comment: Performed at [facility], [address] [place], [place] [address]  CMP (Cancer Center only)     Status: Abnormal   Collection Time: 03/09/24  9:24 AM  Result Value Ref Range   Sodium 137 135 - 145 mmol/L   Potassium 4.4 3.5 - 5.1 mmol/L   Chloride 104 98 - 111 mmol/L   CO2 25 22 - 32 mmol/L   Glucose, Bld 243 (H) 70 - 99 mg/dL    Comment: Glucose reference range applies only to samples taken after fasting for at least 8 hours.   BUN 14 8 - 23 mg/dL   Creatinine 9.17 9.55 - 1.00 mg/dL   Calcium  9.0 8.9 - 10.3 mg/dL   Total Protein 8.2 (H) 6.5 - 8.1 g/dL   Albumin 3.2 (L) 3.5 - 5.0 g/dL   AST 19 15 - 41 U/L   ALT 10 0 - 44 U/L   Alkaline Phosphatase 67 38 - 126 U/L   Total Bilirubin 0.3 0.0 - 1.2 mg/dL   GFR, Estimated >39 >39 mL/min    Comment: (NOTE) Calculated using the CKD-EPI Creatinine Equation (2021)    Anion gap 8 5 - 15    Comment: Performed at [facility], [address] [place], [place] [address]  Pulmonary Function Test     Status: None   Collection Time: 03/14/24  3:12 PM  Result Value Ref  Range   FEV1     FVC     FEV1/FVC     TLC     DLCO      Radiology: CT CHEST W CONTRAST Result Date: 03/21/2024 EXAM: CT CHEST WITH CONTRAST 03/21/2024 10:11:40 AM TECHNIQUE: CT of the chest was performed with the administration of 80 mL of iohexol  (OMNIPAQUE ) 300 MG/ML solution. Multiplanar reformatted images are provided for review. Automated exposure control, iterative reconstruction, and/or weight based adjustment of the mA/kV was utilized to reduce the radiation dose to as low as reasonably achievable. COMPARISON: 12/11/2023 CLINICAL HISTORY: Malignant neoplasm of lung (HCC) and bronchitis. FINDINGS: MEDIASTINUM: Myocardiomegaly. Thoracic aortic, coronary artery, and branch vessel atheromatous vascular calcifications. No acute pulmonary embolism is identified. 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 Pericardium is unremarkable. The central airways are clear. LYMPH NODES: No mediastinal, hilar or axillary lymphadenopathy. LUNGS AND PLEURA: Bandlike consolidation in the right upper lobe and superior segment right lower lobe with some associated volume loss and associated airway plugging in the superior segment right lower lobe. The consolidation appears reduced in volume but more confluent compared to 12/11/2023. This may well be the result of prior radiation  therapy. There are some patchy ground glass opacities in the right upper lobe suggesting alveolitis. A well-defined mass separate from the band of consolidation is not well seen. Mild peripheral bandlike and ground glass opacities in the left upper lobe and left lower lobe, improved from previous. 3 mm left upper lobe nodule shown on image 57 series 4, stable. Trace right pleural effusion with right pleural thickening and some pleural enhancement and mild pleural nodularity along the diaphragm posteriorly on the right. Pleural metastatic disease not excluded. Prior  left pleural effusion has resolved. No pneumothorax. No pulmonary edema. SOFT TISSUES/BONES: Thoracic spondylosis. New 40 percent superior endplate compression fracture at the T5 vertebral level. No acute abnormality of the soft tissues. UPPER ABDOMEN: Limited images of the upper abdomen demonstrate cholelithiasis. IMPRESSION: 1. No acute pulmonary embolism. 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 2. Bandlike consolidation in the right upper lobe and superior segment right lower lobe with associated volume loss and airway plugging, possibly related to prior radiation therapy. Patchy ground glass opacities in the right upper lobe suggest alveolitis. No well-defined separate mass is identified. 3. Trace right pleural effusion with right pleural thickening and enhancement, with mild right diaphragmatic pleural nodularity; pleural metastatic disease is not excluded although the the region was not significantly hypermetabolic on 01/13/2024 PET-CT. 4. Mild peripheral bandlike and ground glass opacities in the left upper and left lower lobes, improved from prior study. 5. Stable 3 mm left upper lobe pulmonary nodule; no routine follow-up imaging is recommended per Fleischner Society Guidelines. 6. New approximately 40% superior endplate compression fracture at T5. 7. Thoracic spondylosis. Electronically signed by: Ryan Salvage MD 03/21/2024 10:43 AM EDT RP Workstation: [id number]    No results found.  CT CHEST W CONTRAST Result Date: 03/21/2024 EXAM: CT CHEST WITH CONTRAST 03/21/2024 10:11:40 AM TECHNIQUE: CT of the chest was performed with the administration of 80 mL of iohexol  (OMNIPAQUE ) 300 MG/ML solution. Multiplanar reformatted images are provided for review. Automated exposure control, iterative reconstruction, and/or weight based adjustment of the mA/kV was utilized to reduce the radiation dose to as low as reasonably achievable. COMPARISON: 12/11/2023  CLINICAL HISTORY: Malignant neoplasm of lung (HCC) and bronchitis. FINDINGS: MEDIASTINUM: Myocardiomegaly. Thoracic aortic, coronary artery, and branch vessel atheromatous vascular calcifications. No acute pulmonary embolism is identified. 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 Pericardium is unremarkable. The central airways are clear. LYMPH NODES: No mediastinal, hilar or axillary lymphadenopathy. LUNGS AND PLEURA: Bandlike consolidation in the right upper lobe and superior segment right lower lobe with some associated volume loss and associated airway plugging in the superior segment right lower lobe. The consolidation appears reduced in volume but more confluent compared to 12/11/2023. This may well be the result of prior radiation therapy. There are some patchy ground glass opacities in the right upper lobe suggesting alveolitis. A well-defined mass separate from the band of consolidation is not well seen. Mild peripheral bandlike and ground glass opacities in the left upper lobe and left lower lobe, improved from previous. 3 mm left upper lobe nodule shown on image 57 series 4, stable. Trace right pleural effusion with right pleural thickening and some pleural enhancement and mild pleural nodularity along the diaphragm posteriorly on the right. Pleural metastatic disease not excluded. Prior left pleural effusion has resolved. No pneumothorax. No pulmonary edema. SOFT TISSUES/BONES: Thoracic spondylosis. New 40 percent superior endplate compression fracture at the T5  vertebral level. No acute abnormality of the soft tissues. UPPER ABDOMEN: Limited images of the upper abdomen demonstrate cholelithiasis. IMPRESSION: 1. No acute pulmonary embolism. 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 2. Bandlike consolidation in the right upper lobe and superior segment right lower lobe with  associated volume loss and airway plugging, possibly related to prior radiation therapy. Patchy ground glass opacities in the right upper lobe suggest alveolitis. No well-defined separate mass is identified. 3. Trace right pleural effusion with right pleural thickening and enhancement, with mild right diaphragmatic pleural nodularity; pleural metastatic disease is not excluded although the the region was not significantly hypermetabolic on 01/13/2024 PET-CT. 4. Mild peripheral bandlike and ground glass opacities in the left upper and left lower lobes, improved from prior study. 5. Stable 3 mm left upper lobe pulmonary nodule; no routine follow-up imaging is recommended per Fleischner Society Guidelines. 6. New approximately 40% superior endplate compression fracture at T5. 7. Thoracic spondylosis. Electronically signed by: Ryan Salvage MD 03/21/2024 10:43 AM EDT RP Workstation: [id number]   [facility] C-ARM 1-60 MIN NO REPORT Result Date: 03/08/2024 Fluoro was used, but no Radiologist interpretation will be provided. Please refer to NOTES tab for provider progress note.   Assessment and Plan: Patient Active Problem List   Diagnosis Date Noted   Low back pain of over 3 months duration 12/27/2023   Intermittent low back pain 12/27/2023   Intractable low back pain 12/27/2023   Mechanical low back pain 12/27/2023   Multifactorial low back pain 12/27/2023   Hypoxic respiratory failure (HCC) 12/11/2023   Sepsis due to pneumonia (HCC) 12/08/2023   Diabetes mellitus without complication (HCC) 12/08/2023   Sepsis, unspecified organism (HCC) 12/08/2023   Acute diarrhea 12/08/2023   Contusion of elbow and forearm, left, initial encounter 12/08/2023   SIRS (systemic inflammatory response syndrome) (HCC) 08/09/2023   Acute cystitis without hematuria 08/09/2023   Acute GI bleeding 08/02/2023   Lower GI bleed 08/02/2023   Chemotherapy induced neutropenia 07/22/2023   Cancer of upper lobe of  right lung (HCC) 05/27/2023   Recurrent non-small cell lung cancer (NSCLC) (HCC) 05/27/2023   Mediastinal adenopathy 05/16/2023   (HFpEF) heart failure with preserved ejection fraction (HCC) 05/09/2023   Piriformis muscle pain (Right) 11/01/2022   Lumbar facet joint pain 09/02/2022   Greater trochanteric bursitis (Left) 01/28/2022   Other bursitis of hip, not elsewhere classified (gluteus medius bursa) (Left) 01/28/2022   Chronic low back pain (Right) w/o sciatica 01/07/2022   History of recurrent deep vein thrombosis (DVT) 11/21/2021   Statin declined 11/02/2021   Osteoarthritis of hips (Bilateral) 09/15/2021   Greater trochanteric bursitis (Right) 09/15/2021   Chronic hip pain (Right) 09/15/2021   Chronic hip pain (Bilateral) (R>L) 09/01/2021   Chronic use of opiate for therapeutic purpose 10/22/2020   Metabolic syndrome 08/14/2020   Generalized weakness 08/14/2020   Truncal obesity 08/04/2020   Latex precautions, history of latex allergy 06/24/2020   Hyperglycemia 06/24/2020   Uncomplicated opioid dependence (HCC) 06/15/2020   Spasm of muscle of lower back 01/29/2020   Abnormal MRI, lumbar spine (08/09/2019) 08/14/2019   Spondylolisthesis, lumbosacral region 07/26/2019   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   Chronic sacroiliac joint pain (Left) 02/28/2019   Small bowel obstruction due to adhesions (HCC) 08/24/2018   Coccygodynia 05/09/2018   Acute deep vein thrombosis (DVT) of distal end of right lower extremity (HCC) 05/08/2018   DDD (degenerative disc disease), lumbosacral 04/11/2018   Chronic anticoagulation (COUMADIN ) 04/11/2018  Severe obesity (BMI 35.0-35.9 with comorbidity) (HCC) 03/15/2018   Secondary osteoarthritis of multiple sites 03/15/2018   Sacral insufficiency fracture 03/15/2018   Chronic low back pain (1ry area of Pain) (Bilateral) w/o sciatica 12/14/2017   Spondylosis without myelopathy or radiculopathy, lumbosacral region 12/14/2017   Other specified  dorsopathies, sacral and sacrococcygeal region 12/14/2017   History of allergy to latex 11/09/2017   Grade 1-2 Anterolisthesis of L4/L5 & L5/S1 (stable as of 11/05/2021) 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) 11/02/2017   Lumbar pars defect (L3 and L4) (Bilateral) 11/02/2017   Diabetic peripheral neuropathy (HCC) 11/02/2017   Chronic lower extremity pain (2ry area of Pain) (Bilateral) 11/02/2017   Chronic radicular pain of lower extremity 11/02/2017   Osteoarthritis of hip (Right) 11/02/2017   Chronic low back pain (Bilateral) w/ sciatica (Bilateral) 10/13/2017   Chronic sacroiliac joint pain (Right) 10/13/2017   Chronic pain syndrome 10/13/2017   Long term current use of opiate analgesic 10/13/2017   Pharmacologic therapy 10/13/2017   Disorder of skeletal system 10/13/2017   Problems influencing health status 10/13/2017   Diverticulosis of colon 06/20/2017   History of pulmonary embolism 06/20/2017   S/P IVC filter 06/20/2017   Positive result for methicillin resistant Staphylococcus aureus (MRSA) screening 05/11/2017   History of colostomy reversal 02/24/2017   Chronic diastolic (congestive) heart failure (HCC) 11/22/2016   HCAP (healthcare-associated pneumonia) 11/18/2016   Edema of both legs 11/15/2016   Benign essential hypertension 08/26/2016   DDD (degenerative disc disease), lumbar 07/14/2016   Acute non-recurrent maxillary sinusitis 05/21/2016   Chronic hip pain (Left) 02/23/2016   Nocturnal leg cramps 02/20/2015   Acute bronchitis 12/03/2014   Uncontrolled type 2 diabetes mellitus with hyperglycemia, without long-term current use of insulin  (HCC) 11/12/2014    1. Obstructive  chronic bronchitis without exacerbation (HCC) (Primary) Medications reviewed we will optimize started on Symbicort  also will give azithromycin  Z-Pak - budesonide -formoterol  (SYMBICORT ) 80-4.5 MCG/ACT inhaler; Inhale 2 puffs into the lungs 2 (two) times daily.  Dispense: 1 each; Refill: 3 - azithromycin  (ZITHROMAX ) 250 MG tablet; Take 2 tablets on day 1, then 1 tablet daily on days 2 through 5  Dispense: 6 tablet; Refill: 0 - DG Chest 2 View; Future  2. Chronic respiratory failure with hypoxia (HCC) Oxygen  therapy as warranted will continue with supportive care  3. Pneumonia due to infectious organism, unspecified laterality, unspecified part of lung Recently treated for infection in the hospital  4. History of adenocarcinoma of lung Follow along with oncology  General Counseling: I have discussed the findings of the evaluation and examination with Luceal.  I have also discussed any further diagnostic evaluation thatmay be needed or ordered today. Prue verbalizes understanding of the findings of todays visit. We also reviewed her medications today and discussed drug interactions and side effects including but not limited excessive drowsiness and altered mental states. We also discussed that there is always a risk not just to her but also people around her. she has been encouraged to call the office with any questions or concerns that should arise related to todays visit.  Orders Placed This Encounter  Procedures   DG Chest 2 View    Standing Status:   Future    Expiration Date:   04/03/2025  Reason for Exam (SYMPTOM  OR DIAGNOSIS REQUIRED):   cough    Preferred imaging location?:   [facility]     Time spent: 83  I have personally obtained a history, examined the patient, evaluated laboratory and imaging results, formulated the assessment and plan and placed orders.    Elfreda DELENA Bathe, MD [facility] Sleep medicine

## 2024-04-03 NOTE — Telephone Encounter (Signed)
 [facility]ar called that dulera and Symbicort  is not covered pt advised that  call her insurance and call us  back

## 2024-04-03 NOTE — Telephone Encounter (Signed)
 Received secure chat indicating patient had questions regarding The information Dr. Melanee gave her at her appointment about pulmonary consult.   Per Dr. Melanee she mentioned to patient that she would speak with Dr. Tamea to see if a bronchoscopy would be needed. Dr. Melanee followed up with Dr. Tamea and it was agreed upon to have a PET scan done in 2 months (patient is scheduled for PET on 12/22 at 10:00am).  Dr. Melanee advised to continue with Tagrisso  for now and after the PET scan is completed they will decide if a bronchoscopy is warranted at that time.  Outbound call to patient; informed of above.  Said she spoke with [facility] earlier today.  Reviewed upcoming PET on 12/22 and patient indicated that's way too long; says she's in a lot of pain and is needing to undergo a kyphoplasty which she said is dependent on her getting the PET. Informed I would touch base with Dr. Melanee and follow up shortly.

## 2024-04-04 ENCOUNTER — Other Ambulatory Visit: Payer: Self-pay | Admitting: *Deleted

## 2024-04-04 MED ORDER — LORAZEPAM 0.5 MG PO TABS: ORAL_TABLET | ORAL | 0 refills | Status: AC

## 2024-04-04 NOTE — Progress Notes (Signed)
 Reviewed upcoming appts with pt for thoracic MRI on Fri 11/14 at 6pm. Pt informed that IR will call her with her appt for virtual visit with Dr. Hughes to discuss results and scheduling KP procedure. Informed that Dr. Melanee sent in a prescription for ativan for her to take 30 minutes prior to MRI scan and may take an additional tablet if needed to help her relax for the scan. Pt aware that may take with oxycodone  if needed per Dr. Melanee. Advised pt to have driver with her for MRI scan due to medications causing drowsiness. Pt verbalized understanding. Nothing further needed at this time.

## 2024-04-06 ENCOUNTER — Ambulatory Visit
Admission: RE | Admit: 2024-04-06 | Discharge: 2024-04-06 | Disposition: A | Discharge status: Home / Self Care | Source: Ambulatory Visit | Attending: Oncology | Admitting: Oncology

## 2024-04-06 DIAGNOSIS — M47814 Spondylosis without myelopathy or radiculopathy, thoracic region: Secondary | ICD-10-CM | POA: Diagnosis not present

## 2024-04-06 DIAGNOSIS — M5124 Other intervertebral disc displacement, thoracic region: Secondary | ICD-10-CM | POA: Diagnosis not present

## 2024-04-06 DIAGNOSIS — M546 Pain in thoracic spine: Secondary | ICD-10-CM | POA: Insufficient documentation

## 2024-04-06 DIAGNOSIS — R29890 Loss of height: Secondary | ICD-10-CM | POA: Diagnosis not present

## 2024-04-06 DIAGNOSIS — M4854XA Collapsed vertebra, not elsewhere classified, thoracic region, initial encounter for fracture: Secondary | ICD-10-CM | POA: Diagnosis not present

## 2024-04-06 MED ORDER — GADOBUTROL 1 MMOL/ML IV SOLN
7.5000 mL | Freq: Once | INTRAVENOUS | Status: AC | PRN
  Administered 2024-04-06: 7.5 mL via INTRAVENOUS

## 2024-04-07 ENCOUNTER — Ambulatory Visit: Payer: Self-pay | Admitting: Oncology

## 2024-04-09 ENCOUNTER — Other Ambulatory Visit: Payer: Self-pay | Admitting: Oncology

## 2024-04-09 DIAGNOSIS — S22000A Wedge compression fracture of unspecified thoracic vertebra, initial encounter for closed fracture: Secondary | ICD-10-CM

## 2024-04-10 NOTE — Progress Notes (Incomplete)
 Chief Complaint: Patient was seen in consultation today for No chief complaint on file.  at the request of Rao,Archana C  Referring Physician(s): Rao,Archana C  History of Present Illness: Kara [name] is a 73 y.o. 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, 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   CHF (congestive heart failure) (HCC)    followed colon resection, wouldn't let me get up   Chronic anticoagulation    Degenerative disc disease, lumbar    Diverticulitis 2018   Diverticulosis    DVT of lower extremity, bilateral (HCC) 2018   Essential hypertension    History of being hospitalized    1 month ago for 3 days, vomiting, and kink in upper intestine   History of hiatal hernia    Mediastinal adenopathy 04/2023   MRSA nasal colonization    Non-small cell lung cancer (HCC) 2022   right   Obesity (BMI 30-39.9)    Osteoarthritis    Osteoporosis    PE (pulmonary thromboembolism) (HCC) 2018   Pneumonia    Small bowel obstruction (HCC) 03/20/2023   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HCC)    Type 2 diabetes mellitus with obesity    Vertigo     Past Surgical History:  Procedure Laterality Date   APPENDECTOMY N/A 02/24/2017   Procedure: Incidental  APPENDECTOMY;  Surgeon: Shelva Dunnings, MD;  Location: [name];  Service: General;  Laterality: N/A;   CARPAL TUNNEL RELEASE Bilateral    CATARACT EXTRACTION W/PHACO Left 11/21/2018   Procedure: CATARACT EXTRACTION PHACO AND INTRAOCULAR LENS PLACEMENT (IOC) LEFT DIABETES;  Surgeon: Jaye Fallow, MD;  Location: [facility];  Service: Ophthalmology;  Laterality: Left;  latex sensitivity Diabetic - oral meds   CATARACT EXTRACTION W/PHACO Right 12/12/2018   Procedure: CATARACT EXTRACTION PHACO AND INTRAOCULAR LENS PLACEMENT (IOC) RIGHT DIABETES;  Surgeon: Jaye Fallow, MD;  Location: [facility];  Service: Ophthalmology;   Laterality: Right;  Diabetes-oral med Latex sensitiviy   COLON RESECTION SIGMOID N/A 11/02/2016   Procedure: COLON RESECTION SIGMOID;  Surgeon: Shelva Dunnings, MD;  Location: [name];  Service: General;  Laterality: N/A;   COLON SURGERY  11/02/2016   COLONOSCOPY WITH PROPOFOL  N/A 02/03/2017   Procedure: COLONOSCOPY WITH PROPOFOL ;  Surgeon: Unk Corinn Skiff, MD;  Location: [name];  Service: Gastroenterology;  Laterality: N/A;   COLOSTOMY N/A 11/02/2016   Procedure: COLOSTOMY;  Surgeon: Shelva Dunnings, MD;  Location: [name];  Service: General;  Laterality: N/A;   COLOSTOMY REVERSAL N/A 02/24/2017   Procedure: COLOSTOMY REVERSAL, ostomy takedown, spleenic flexure mobilization, excision rectal stump/distal sigmoid, anastomosis with suture reinforcement;  Surgeon: Shelva Dunnings, MD;  Location: [name];  Service: General;  Laterality: N/A;   ENDOBRONCHIAL ULTRASOUND N/A 05/16/2023   Procedure: ENDOBRONCHIAL ULTRASOUND;  Surgeon: Tamea Dedra CROME, MD;  Location: [name];  Service: Cardiopulmonary;  Laterality: N/A;   FLEXIBLE BRONCHOSCOPY N/A 05/16/2023   Procedure: FLEXIBLE BRONCHOSCOPY;  Surgeon: Tamea Dedra CROME, MD;  Location: [name];  Service: Cardiopulmonary;  Laterality: N/A;   ILEO LOOP DIVERSION N/A 02/24/2017   Procedure: ILEO LOOP COLOSTOMY;  Surgeon: Shelva Dunnings, MD;  Location: [name];  Service: General;  Laterality: N/A;   ILEOSTOMY CLOSURE N/A 06/01/2017   Procedure: LOOP ILEOSTOMY TAKEDOWN;  Surgeon: Shelva Dunnings, MD;  Location: [name];  Service: General;  Laterality: N/A;   IR IMAGING GUIDED PORT INSERTION  06/14/2023   IR  REMOVAL TUN ACCESS W/ PORT W/O FL MOD SED  11/16/2023   IVC FILTER INSERTION Right 10/2016   IVC FILTER REMOVAL N/A 08/09/2017   Procedure: IVC FILTER REMOVAL;  Surgeon: Jama Cordella MATSU, MD;  Location: [name];  Service: Cardiovascular;  Laterality: N/A;   KNEE SURGERY Right    torn menicus   LAPAROSCOPY  N/A 02/24/2017   Procedure: LAPAROSCOPY DIAGNOSTIC;  Surgeon: Shelva Dunnings, MD;  Location: [name];  Service: General;  Laterality: N/A;   LYSIS OF ADHESION N/A 02/24/2017   Procedure: LYSIS OF ADHESION;  Surgeon: Shelva Dunnings, MD;  Location: [name];  Service: General;  Laterality: N/A;   PULMONARY VENOGRAPHY N/A 11/19/2016   Procedure: Pulmonary Venography; IVC filter placement; possible pulmonary thrombectomy;  Surgeon: Jama Cordella MATSU, MD;  Location: [name];  Service: Cardiovascular;  Laterality: N/A;   SACROPLASTY N/A 11/08/2017   Procedure: SACROPLASTY S2;  Surgeon: Kathlynn Sharper, MD;  Location: [name];  Service: Orthopedics;  Laterality: N/A;   SIGMOIDOSCOPY N/A 11/13/2016   Procedure: endoscopic  flexible SIGMOIDOSCOPY;  Surgeon: Wonda Charlie BRAVO, MD;  Location: [name];  Service: General;  Laterality: N/A;   SIGMOIDOSCOPY N/A 05/19/2017   Procedure: ENID MORIN;  Surgeon: Shelva Dunnings, MD;  Location: [name];  Service: General;  Laterality: N/A;   VIDEO BRONCHOSCOPY WITH ENDOBRONCHIAL NAVIGATION N/A 02/23/2021   Procedure: ROBOTIC VIDEO BRONCHOSCOPY WITH ENDOBRONCHIAL NAVIGATION;  Surgeon: Tamea Dedra CROME, MD;  Location: [name];  Service: Pulmonary;  Laterality: N/A;    Allergies: Adhesive [tape], Avapro  [irbesartan ], Morphine  and codeine , Other, and Latex  Medications: Prior to Admission medications   Medication Sig Start Date End Date Taking? Authorizing Provider  albuterol  (VENTOLIN  HFA) 108 (90 Base) MCG/ACT inhaler Inhale 2 puffs into the lungs every 6 (six) hours as needed for wheezing or shortness of breath. 01/10/24   Fernand Elfreda LABOR, MD  bisacodyl  (DULCOLAX) 10 MG suppository Place 1 suppository (10 mg total) rectally as needed for moderate constipation. 07/06/23   Viviann Pastor, MD  budesonide -formoterol  (SYMBICORT ) 80-4.5 MCG/ACT inhaler Inhale 2 puffs into the lungs 2 (two) times daily. 04/03/24   Khan, Saadat A, MD   candesartan (ATACAND) 16 MG tablet Take 16 mg by mouth in the morning. 08/03/17   [provider]  cholecalciferol  (VITAMIN D ) 25 MCG (1000 UNIT) tablet Take 1,000 Units by mouth in the morning.    [provider]  Cyanocobalamin  (VITAMIN B-12) 5000 MCG TBDP Take 5,000 mcg by mouth in the morning.    [provider]  cyclobenzaprine  (FLEXERIL ) 10 MG tablet Take 10 mg by mouth at bedtime. 07/05/16   [provider]  Dulaglutide (TRULICITY) 1.5 MG/0.5ML SOAJ Inject 1.5 mg into the skin once a week. Saturday 07/28/23 07/27/24  [provider]  gabapentin  (NEURONTIN ) 100 MG capsule Take 200 mg by mouth with breakfast, with lunch, and with evening meal.    [provider]  gabapentin  (NEURONTIN ) 300 MG capsule Take 300 mg by mouth at bedtime.    [provider]  glipiZIDE  (GLUCOTROL ) 10 MG tablet Take 10 mg by mouth daily before breakfast.    [provider]  HYDROcodone  bit-homatropine (HYCODAN) 5-1.5 MG/5ML syrup Take 5 mLs by mouth every 8 (eight) hours as needed for cough. 04/03/24   Rao, Archana C, MD  LORazepam (ATIVAN) 0.5 MG tablet Take 1 tablet by mouth 30 minutes prior to MRI scan. May take additional tablet if needed. 04/04/24   Melanee Annah BROCKS, MD  Magnesium  Oxide -Mg Supplement 500 MG TABS Take 1 tablet by mouth at bedtime.    [provider]  mometasone-formoterol  (DULERA) 100-5 MCG/ACT AERO Inhale 2 puffs into the lungs 2 (two) times daily. 04/03/24   Fernand Elfreda LABOR, MD  omeprazole  (PRILOSEC) 20 MG capsule Take 1 capsule (20 mg total) by mouth daily. 08/30/23   Dasie Tinnie MATSU, NP  ondansetron  (ZOFRAN ) 8 MG tablet TAKE 1 TABLET BY MOUTH EVERY 8 HOURS AS NEEDED FOR NAUSEA OR VOMITING. START ON THE 3RD DAY AFTER CHEMOTHERAPY 08/15/23   Melanee Annah BROCKS, MD  [facility] ULTRA TEST test strip  01/25/23   [provider]  osimertinib  mesylate (TAGRISSO ) 40 MG tablet Take 1 tablet (40 mg total) by mouth daily. 02/17/24    Melanee Annah BROCKS, MD  oxybutynin  (DITROPAN ) 5 MG tablet Take 1 tablet by mouth 2 (two) times daily. 02/25/22   [provider]  oxyCODONE  (OXY IR/ROXICODONE ) 5 MG immediate release tablet Take 1 tablet (5 mg total) by mouth every 6 (six) hours as needed for severe pain (pain score 7-10). 04/02/24   Melanee Annah BROCKS, MD  OXYGEN Inhale 2 L into the lungs.    [provider]  prochlorperazine  (COMPAZINE ) 10 MG tablet Take 1 tablet (10 mg total) by mouth every 6 (six) hours as needed for nausea or vomiting. 08/30/23   Dasie Tinnie MATSU, NP  rOPINIRole  (REQUIP ) 1 MG tablet Take 1 mg by mouth at bedtime. 06/22/23   [provider]  sucralfate  (CARAFATE ) 1 g tablet Take 1 tablet (1 g total) by mouth 3 (three) times daily with meals. Dissolve tablet in 4 tablesppons warm water swish and swallow 30 min before meals. Patient taking differently: Take 1 g by mouth 3 (three) times daily with meals. Dissolve tablet in 4 tablespoons warm water swish and swallow 30 min before meals. 07/22/23 04/02/24  Lenn Aran, MD  warfarin (COUMADIN ) 5 MG tablet Take 5 mg by mouth daily. 06/01/23   [provider]     Family History  Problem Relation Age of Onset   Diabetes Mother    Heart disease Mother    Cancer Mother    Breast cancer Mother        >50   Heart disease Father    Diabetes Sister    Heart disease Sister     Social History   Socioeconomic History   Marital status: Married    Spouse name: Butler   Number of children: 1   Years of education: Not on file   Highest education level: Not on file  Occupational History   Not on file  Tobacco Use   Smoking status: Never    Passive exposure: Current (husband smokes)   Smokeless tobacco: Never  Vaping Use   Vaping status: Never Used  Substance and Sexual Activity   Alcohol  use: No    Alcohol /week: 0.0 standard drinks of alcohol    Drug use: No   Sexual activity: Never  Other Topics Concern   Not on file  Social History  Narrative   Not on file   Social Drivers of Health   Financial Resource Strain: Low Risk  (05/19/2023)   Received from [facility]   Overall Financial Resource Strain (CARDIA)    Difficulty of Paying Living Expenses: Not hard at all  Food Insecurity: No Food Insecurity (12/12/2023)   Hunger Vital Sign    Worried About Running Out of Food in the Last Year: Never true  Ran Out of Food in the Last Year: Never true  Transportation Needs: No Transportation Needs (12/12/2023)   PRAPARE - Administrator, Civil Service (Medical): No    Lack of Transportation (Non-Medical): No  Physical Activity: Not on file  Stress: Not on file  Social Connections: Moderately Isolated (12/12/2023)   Social Connection and Isolation Panel    Frequency of Communication with Friends and Family: More than three times a week    Frequency of Social Gatherings with Friends and Family: Once a week    Attends Religious Services: Never    Database Administrator or Organizations: No    Attends Banker Meetings: Never    Marital Status: Married    ECOG Status: {CHL ONC ECOG ED:8845999799}  Review of Systems: A 12 point ROS discussed and pertinent positives are indicated in the HPI above.  All other systems are negative.  Review of Systems  Vital Signs: There were no vitals taken for this visit.  Physical Exam  Mallampati Score:     Imaging: MR Thoracic Spine W Wo Contrast Result Date: 04/06/2024 CLINICAL DATA:  Initial evaluation for compression fracture, back pain. EXAM: MRI THORACIC WITHOUT AND WITH CONTRAST TECHNIQUE: Multiplanar and multiecho pulse sequences of the thoracic spine were obtained without and with intravenous contrast. CONTRAST:  7.5mL GADAVIST GADOBUTROL 1 MMOL/ML IV SOLN COMPARISON:  Comparison made with prior CT from 03/21/2024. FINDINGS: Alignment: Mild levoscoliosis with exaggeration of the normal thoracic kyphosis. No listhesis. Vertebrae:  There is an acute/recent compression fracture involving the superior endplate of T5. Fracture line traverses the T4-5 interspace with extension through the posterior cortex. Associated height loss measures up to 40% with trace 3 mm retropulsion. No significant stenosis. No visible underlying pathologic lesion by MRI. Prominent endplate Schmorl's node deformity with reactive marrow edema present at the adjacent inferior endplate of T4. This appears somewhat progressed as compared to prior CT. Associated mild height loss without retropulsion. Otherwise, vertebral body height maintained with no other acute or chronic fracture. Bone marrow signal intensity overall within normal limits. Few scattered benign hemangiomata noted. No worrisome osseous lesions. No other abnormal marrow edema or enhancement. Cord:  Normal signal morphology. No abnormal enhancement. Paraspinal and other soft tissues: Paraspinous soft tissues demonstrate no acute finding. Bandlike consolidative opacity within the partially visualized right upper and lower lobes. Associated pleuroparenchymal thickening about the visualized right lung with small layering right pleural effusion. Findings are similar to prior CT with these changes are better evaluated. Disc levels: T1-2:  Unremarkable. T2-3: Normal interspace. Mild right-sided facet spurring. No canal or foraminal stenosis. T3-4:  Minimal disc bulge.  No canal or foraminal stenosis. T4-5:  Mild disc bulge.  Minimal facet hypertrophy.  No stenosis. T5-6: Mild disc bulge. 3 mm bony retropulsion related to the T5 compression fracture indents the central to right paracentral thecal sac (series 21, image 18). Mild cord flattening without cord signal changes or significant spinal stenosis. Foramina remain patent. T6-7: Left paracentral disc protrusion indents the left ventral thecal sac (series 21, image 20). Mild cord flattening without cord signal changes or significant spinal stenosis. Foramina remain  patent. T7-8: Tiny right paracentral disc protrusion indents the right ventral thecal sac without stenosis (series 21, image 24). T8-9: Chronic endplate Schmorl's node deformity without significant disc bulge or stenosis. T9-10: Right paracentral disc extrusion with superior migration without significant stenosis (series 21, image 29). T10-11: Disc desiccation with mild disc bulge and endplate spurring. Chronic endplate Schmorl's node  deformity. Mild facet hypertrophy. No stenosis. T11-12:  Minimal disc bulge.  No stenosis. T12-L1: Seen only on sagittal projection. Mild diffuse disc bulge with bilateral facet hypertrophy. No significant stenosis. IMPRESSION: 1. Acute/recent compression fracture involving the superior endplate of T5 with up to 40% height loss and trace 3 mm bony retropulsion. No significant stenosis. 2. Prominent endplate Schmorl's node deformity with reactive marrow edema at the adjacent inferior endplate of T4. This appears somewhat progressed as compared to prior CT, and could also contribute to back pain. 3. Mild multilevel degenerative spondylosis with a few scattered small disc protrusions as detailed above. No significant stenosis. 4. Bandlike consolidative opacity within the partially visualized right upper and lower lobes with associated pleuroparenchymal thickening and small layering right pleural effusion. Findings are similar to prior chest CT with these changes are better evaluated. Electronically Signed   By: Morene Hoard M.D.   On: 04/06/2024 19:20   CT CHEST W CONTRAST Result Date: 03/21/2024 EXAM: CT CHEST WITH CONTRAST 03/21/2024 10:11:40 AM TECHNIQUE: CT of the chest was performed with the administration of 80 mL of iohexol  (OMNIPAQUE ) 300 MG/ML solution. Multiplanar reformatted images are provided for review. Automated exposure control, iterative reconstruction, and/or weight based adjustment of the mA/kV was utilized to reduce the radiation dose to as low as  reasonably achievable. COMPARISON: 12/11/2023 CLINICAL HISTORY: Malignant neoplasm of lung (HCC) and bronchitis. FINDINGS: MEDIASTINUM: Myocardiomegaly. Thoracic aortic, coronary artery, and branch vessel atheromatous vascular calcifications. No acute pulmonary embolism is identified. 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 Pericardium is unremarkable. The central airways are clear. LYMPH NODES: No mediastinal, hilar or axillary lymphadenopathy. LUNGS AND PLEURA: Bandlike consolidation in the right upper lobe and superior segment right lower lobe with some associated volume loss and associated airway plugging in the superior segment right lower lobe. The consolidation appears reduced in volume but more confluent compared to 12/11/2023. This may well be the result of prior radiation therapy. There are some patchy ground glass opacities in the right upper lobe suggesting alveolitis. A well-defined mass separate from the band of consolidation is not well seen. Mild peripheral bandlike and ground glass opacities in the left upper lobe and left lower lobe, improved from previous. 3 mm left upper lobe nodule shown on image 57 series 4, stable. Trace right pleural effusion with right pleural thickening and some pleural enhancement and mild pleural nodularity along the diaphragm posteriorly on the right. Pleural metastatic disease not excluded. Prior left pleural effusion has resolved. No pneumothorax. No pulmonary edema. SOFT TISSUES/BONES: Thoracic spondylosis. New 40 percent superior endplate compression fracture at the T5 vertebral level. No acute abnormality of the soft tissues. UPPER ABDOMEN: Limited images of the upper abdomen demonstrate cholelithiasis. IMPRESSION: 1. No acute pulmonary embolism. 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 2. Bandlike consolidation in the right upper lobe and  superior segment right lower lobe with associated volume loss and airway plugging, possibly related to prior radiation therapy. Patchy ground glass opacities in the right upper lobe suggest alveolitis. No well-defined separate mass is identified. 3. Trace right pleural effusion with right pleural thickening and enhancement, with mild right diaphragmatic pleural nodularity; pleural metastatic disease is not excluded although the the region was not significantly hypermetabolic on 01/13/2024 PET-CT. 4. Mild peripheral bandlike and ground glass opacities in the left upper and left lower lobes, improved from prior study. 5. Stable 3 mm left  upper lobe pulmonary nodule; no routine follow-up imaging is recommended per Fleischner Society Guidelines. 6. New approximately 40% superior endplate compression fracture at T5. 7. Thoracic spondylosis. Electronically signed by: Ryan Salvage MD 03/21/2024 10:43 AM EDT RP Workstation: [id number]    Labs:  CBC: Recent Labs    12/20/23 0541 01/25/24 1459 01/30/24 1455 03/09/24 0924  WBC 5.9 3.6* 4.2 5.2  HGB 10.1* 11.3* 11.5* 11.8*  HCT 31.5* 35.6* 36.0 35.8*  PLT 127* 78* 94* 162    COAGS: Recent Labs    08/02/23 1108 08/02/23 1440 12/17/23 0306 12/18/23 0401 12/19/23 0137 12/20/23 0541  INR 9.8*   < > 2.2* 2.4* 2.4* 3.0*  APTT 49*  --   --   --   --   --    < > = values in this interval not displayed.    BMP: Recent Labs    12/20/23 0541 01/25/24 1459 01/30/24 1455 03/09/24 0924  NA 138 136 135 137  K 4.0 3.6 3.8 4.4  CL 102 102 102 104  CO2 28 26 24 25   GLUCOSE 231* 135* 174* 243*  BUN 22 9 9 14   CALCIUM  8.3* 9.0 8.8* 9.0  CREATININE 0.84 0.85 0.86 0.82  GFRNONAA >60 >60 >60 >60    LIVER FUNCTION TESTS: Recent Labs    12/07/23 1858 12/11/23 2106 01/25/24 1459 03/09/24 0924  BILITOT 0.9 0.6 0.4 0.3  AST 31 16 20 19   ALT 18 10 8 10   ALKPHOS 61 40 39 67  PROT 7.7 7.5 7.2 8.2*  ALBUMIN 3.4* 2.8* 3.3* 3.2*    TUMOR  MARKERS: No results for input(s): AFPTM, CEA, CA199, CHROMGRNA in the last 8760 hours.  Assessment and Plan:  ***  Thank you for this interesting consult.  I greatly enjoyed meeting [employer] and look forward to participating in their care.  A copy of this report was sent to the requesting provider on this date.  Electronically Signed:  Thom Hall, MD [facility]   Pager. [phone] Clinic. [phone]  I spent a total of {New INPT:304952001} {New Out-Pt:304952002}  {Established Out-Pt:304952003} in face to face in clinical consultation, greater than 50% of which was counseling/coordinating care for ***

## 2024-04-12 ENCOUNTER — Encounter: Payer: Self-pay | Admitting: Pain Medicine

## 2024-04-12 ENCOUNTER — Ambulatory Visit
Admission: RE | Admit: 2024-04-12 | Discharge: 2024-04-12 | Disposition: A | Discharge status: Home / Self Care | Source: Ambulatory Visit | Attending: Pain Medicine | Admitting: Pain Medicine

## 2024-04-12 ENCOUNTER — Ambulatory Visit (HOSPITAL_BASED_OUTPATIENT_CLINIC_OR_DEPARTMENT_OTHER): Admitting: Pain Medicine

## 2024-04-12 ENCOUNTER — Inpatient Hospital Stay
Admission: RE | Admit: 2024-04-12 | Discharge: 2024-04-12 | Disposition: A | Source: Ambulatory Visit | Attending: Oncology

## 2024-04-12 VITALS — BP 114/60 | HR 104 | Temp 97.3°F | Resp 16 | Ht 63.0 in | Wt 145.0 lb

## 2024-04-12 DIAGNOSIS — G8929 Other chronic pain: Secondary | ICD-10-CM | POA: Diagnosis not present

## 2024-04-12 DIAGNOSIS — M4317 Spondylolisthesis, lumbosacral region: Secondary | ICD-10-CM | POA: Insufficient documentation

## 2024-04-12 DIAGNOSIS — M4310 Spondylolisthesis, site unspecified: Secondary | ICD-10-CM

## 2024-04-12 DIAGNOSIS — G8918 Other acute postprocedural pain: Secondary | ICD-10-CM | POA: Diagnosis not present

## 2024-04-12 DIAGNOSIS — M545 Low back pain, unspecified: Secondary | ICD-10-CM

## 2024-04-12 DIAGNOSIS — M4306 Spondylolysis, lumbar region: Secondary | ICD-10-CM | POA: Insufficient documentation

## 2024-04-12 DIAGNOSIS — S22000A Wedge compression fracture of unspecified thoracic vertebra, initial encounter for closed fracture: Secondary | ICD-10-CM

## 2024-04-12 DIAGNOSIS — M47816 Spondylosis without myelopathy or radiculopathy, lumbar region: Secondary | ICD-10-CM | POA: Diagnosis not present

## 2024-04-12 DIAGNOSIS — M47817 Spondylosis without myelopathy or radiculopathy, lumbosacral region: Secondary | ICD-10-CM | POA: Insufficient documentation

## 2024-04-12 DIAGNOSIS — Z7901 Long term (current) use of anticoagulants: Secondary | ICD-10-CM | POA: Insufficient documentation

## 2024-04-12 DIAGNOSIS — M5459 Other low back pain: Secondary | ICD-10-CM | POA: Diagnosis not present

## 2024-04-12 MED ORDER — TRIAMCINOLONE ACETONIDE 40 MG/ML IJ SUSP
INTRAMUSCULAR | Status: AC
  Filled 2024-04-12: qty 1

## 2024-04-12 MED ORDER — LIDOCAINE HCL 2 % IJ SOLN
20.0000 mL | Freq: Once | INTRAMUSCULAR | Status: AC
  Administered 2024-04-12: 400 mg

## 2024-04-12 MED ORDER — MIDAZOLAM HCL 5 MG/5ML IJ SOLN
0.5000 mg | Freq: Once | INTRAMUSCULAR | Status: AC
  Administered 2024-04-12: 1 mg via INTRAVENOUS

## 2024-04-12 MED ORDER — PENTAFLUOROPROP-TETRAFLUOROETH EX AERO: INHALATION_SPRAY | Freq: Once | CUTANEOUS | Status: AC

## 2024-04-12 MED ORDER — FENTANYL CITRATE (PF) 100 MCG/2ML IJ SOLN
INTRAMUSCULAR | Status: AC
  Filled 2024-04-12: qty 2

## 2024-04-12 MED ORDER — FENTANYL CITRATE (PF) 100 MCG/2ML IJ SOLN: 25.0000 ug | INTRAMUSCULAR | Status: DC | PRN

## 2024-04-12 MED ORDER — ROPIVACAINE HCL 2 MG/ML IJ SOLN
INTRAMUSCULAR | Status: AC
  Filled 2024-04-12: qty 20

## 2024-04-12 MED ORDER — TRIAMCINOLONE ACETONIDE 40 MG/ML IJ SUSP
40.0000 mg | Freq: Once | INTRAMUSCULAR | Status: AC
  Administered 2024-04-12: 40 mg

## 2024-04-12 MED ORDER — HYDROCODONE-ACETAMINOPHEN 5-325 MG PO TABS: 1.0000 | ORAL_TABLET | Freq: Three times a day (TID) | ORAL | 0 refills | Status: AC | PRN

## 2024-04-12 MED ORDER — ROPIVACAINE HCL 2 MG/ML IJ SOLN
9.0000 mL | Freq: Once | INTRAMUSCULAR | Status: AC
  Administered 2024-04-12: 9 mL via PERINEURAL

## 2024-04-12 MED ORDER — MIDAZOLAM HCL 5 MG/5ML IJ SOLN
INTRAMUSCULAR | Status: AC
  Filled 2024-04-12: qty 5

## 2024-04-12 MED ORDER — LIDOCAINE HCL 2 % IJ SOLN
INTRAMUSCULAR | Status: AC
  Filled 2024-04-12: qty 20

## 2024-04-12 NOTE — Consult Note (Shared)
 Chief Complaint: Patient was seen in consultation today for thoracic compression fracture and consideration for thoracic kyphoplasty at the request of Rao,Archana C  Referring Physician(s): Rao,Archana C  Consulting Physician: Hughes Simmonds  History of Present Illness: Kara [name] is a 73 y.o. female with a history of right upper lobe lung cancer s/p SBRT in December 2022. She was previously in remission until November 2024 when she was found to have recurrence in the right hilum and mediastinal region. She has been following with Dr. Melanee for concurrent chemoradiation. Unfortunately, during this time she began to develop worsening back pain, unrelated to her cancer, that radiates to her chest. In late October 2025 she underwent imaging of her chest which revealed a new 40% superior endplate compression fracture at T5. Follow up MR Thoracic Spine on 11/14 confirmed:  Acute compression fracture involving the superior endplate of T5. Fracture line traverses the T4-5 interspace with extension through the posterior cortex. Associated height loss measures up to 40% with trace 3mm retropulsion. Prominent endplate Schmorl's node deformity with reactive marrow edema at the adjacent inferior endplate of T4 that appears somewhat progressed compared to prior CT and could also contribute to back pain.  Patient reports her pain is severe requiring her to lay on her side for relief. She also admits to insufficient pain management with her previous hydrocodone  regimen. Due to persistent back pain, she has switched to taking oxycodone  5mg  every 6 hours PRN which she reports *** her pain.  On evaluation today, patient ***   Past Medical History:  Diagnosis Date   Anginal pain    Arthritis    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, 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   CHF (congestive heart failure) (HCC)    followed colon resection, wouldn't let me get up   Chronic anticoagulation    Degenerative disc  disease, lumbar    Diverticulitis 2018   Diverticulosis    DVT of lower extremity, bilateral (HCC) 2018   Essential hypertension    History of being hospitalized    1 month ago for 3 days, vomiting, and kink in upper intestine   History of hiatal hernia    Mediastinal adenopathy 04/2023   MRSA nasal colonization    Non-small cell lung cancer (HCC) 2022   right   Obesity (BMI 30-39.9)    Osteoarthritis    Osteoporosis    PE (pulmonary thromboembolism) (HCC) 2018   Pneumonia    Small bowel obstruction (HCC) 03/20/2023   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HCC)    Type 2 diabetes mellitus with obesity    Vertigo     Past Surgical History:  Procedure Laterality Date   APPENDECTOMY N/A 02/24/2017   Procedure: Incidental  APPENDECTOMY;  Surgeon: Shelva Dunnings, MD;  Location: [name];  Service: General;  Laterality: N/A;   CARPAL TUNNEL RELEASE Bilateral    CATARACT EXTRACTION W/PHACO Left 11/21/2018   Procedure: CATARACT EXTRACTION PHACO AND INTRAOCULAR LENS PLACEMENT (IOC) LEFT DIABETES;  Surgeon: Jaye Fallow, MD;  Location: [facility];  Service: Ophthalmology;  Laterality: Left;  latex sensitivity Diabetic - oral meds   CATARACT EXTRACTION W/PHACO Right 12/12/2018   Procedure: CATARACT EXTRACTION PHACO AND INTRAOCULAR LENS PLACEMENT (IOC) RIGHT DIABETES;  Surgeon: Jaye Fallow, MD;  Location: [facility];  Service: Ophthalmology;  Laterality: Right;  Diabetes-oral med Latex sensitiviy   COLON RESECTION SIGMOID N/A 11/02/2016   Procedure: COLON RESECTION SIGMOID;  Surgeon: Shelva Dunnings, MD;  Location: [name];  Service: General;  Laterality: N/A;   COLON SURGERY  11/02/2016   COLONOSCOPY WITH PROPOFOL  N/A 02/03/2017   Procedure: COLONOSCOPY WITH PROPOFOL ;  Surgeon: Unk Corinn Skiff, MD;  Location: [facility];  Service: Gastroenterology;  Laterality: N/A;   COLOSTOMY N/A 11/02/2016   Procedure: COLOSTOMY;  Surgeon: Shelva Dunnings, MD;   Location: [name];  Service: General;  Laterality: N/A;   COLOSTOMY REVERSAL N/A 02/24/2017   Procedure: COLOSTOMY REVERSAL, ostomy takedown, spleenic flexure mobilization, excision rectal stump/distal sigmoid, anastomosis with suture reinforcement;  Surgeon: Shelva Dunnings, MD;  Location: [name];  Service: General;  Laterality: N/A;   ENDOBRONCHIAL ULTRASOUND N/A 05/16/2023   Procedure: ENDOBRONCHIAL ULTRASOUND;  Surgeon: Tamea Dedra CROME, MD;  Location: [name];  Service: Cardiopulmonary;  Laterality: N/A;   FLEXIBLE BRONCHOSCOPY N/A 05/16/2023   Procedure: FLEXIBLE BRONCHOSCOPY;  Surgeon: Tamea Dedra CROME, MD;  Location: [name];  Service: Cardiopulmonary;  Laterality: N/A;   ILEO LOOP DIVERSION N/A 02/24/2017   Procedure: ILEO LOOP COLOSTOMY;  Surgeon: Shelva Dunnings, MD;  Location: [name];  Service: General;  Laterality: N/A;   ILEOSTOMY CLOSURE N/A 06/01/2017   Procedure: LOOP ILEOSTOMY TAKEDOWN;  Surgeon: Shelva Dunnings, MD;  Location: [name];  Service: General;  Laterality: N/A;   IR IMAGING GUIDED PORT INSERTION  06/14/2023   IR REMOVAL TUN ACCESS W/ PORT W/O FL MOD SED  11/16/2023   IVC FILTER INSERTION Right 10/2016   IVC FILTER REMOVAL N/A 08/09/2017   Procedure: IVC FILTER REMOVAL;  Surgeon: Jama Cordella MATSU, MD;  Location: [name];  Service: Cardiovascular;  Laterality: N/A;   KNEE SURGERY Right    torn menicus   LAPAROSCOPY N/A 02/24/2017   Procedure: LAPAROSCOPY DIAGNOSTIC;  Surgeon: Shelva Dunnings, MD;  Location: [name];  Service: General;  Laterality: N/A;   LYSIS OF ADHESION N/A 02/24/2017   Procedure: LYSIS OF ADHESION;  Surgeon: Shelva Dunnings, MD;  Location: [name];  Service: General;  Laterality: N/A;   PULMONARY VENOGRAPHY N/A 11/19/2016   Procedure: Pulmonary Venography; IVC filter placement; possible pulmonary thrombectomy;  Surgeon: Jama Cordella MATSU, MD;  Location: [name];  Service: Cardiovascular;  Laterality:  N/A;   SACROPLASTY N/A 11/08/2017   Procedure: SACROPLASTY S2;  Surgeon: Kathlynn Sharper, MD;  Location: [name];  Service: Orthopedics;  Laterality: N/A;   SIGMOIDOSCOPY N/A 11/13/2016   Procedure: endoscopic  flexible SIGMOIDOSCOPY;  Surgeon: Wonda Charlie BRAVO, MD;  Location: [name];  Service: General;  Laterality: N/A;   SIGMOIDOSCOPY N/A 05/19/2017   Procedure: ENID MORIN;  Surgeon: Shelva Dunnings, MD;  Location: [name];  Service: General;  Laterality: N/A;   VIDEO BRONCHOSCOPY WITH ENDOBRONCHIAL NAVIGATION N/A 02/23/2021   Procedure: ROBOTIC VIDEO BRONCHOSCOPY WITH ENDOBRONCHIAL NAVIGATION;  Surgeon: Tamea Dedra CROME, MD;  Location: [name];  Service: Pulmonary;  Laterality: N/A;    Allergies: Adhesive [tape], Avapro  [irbesartan ], Morphine  and codeine , Other, and Latex  Medications: Prior to Admission medications   Medication Sig Start Date End Date Taking? Authorizing Provider  albuterol  (VENTOLIN  HFA) 108 (90 Base) MCG/ACT inhaler Inhale 2 puffs into the lungs every 6 (six) hours as needed for wheezing or shortness of breath. 01/10/24   Fernand Elfreda LABOR, MD  bisacodyl  (DULCOLAX) 10 MG suppository Place 1 suppository (10 mg total) rectally as needed for moderate constipation. 07/06/23   Viviann Pastor, MD  budesonide -formoterol  (SYMBICORT ) 80-4.5 MCG/ACT inhaler Inhale 2 puffs into the lungs 2 (two) times daily. 04/03/24   Khan, Saadat A, MD  candesartan (ATACAND) 16 MG tablet  Take 16 mg by mouth in the morning. 08/03/17   [provider]  cholecalciferol  (VITAMIN D ) 25 MCG (1000 UNIT) tablet Take 1,000 Units by mouth in the morning.    [provider]  Cyanocobalamin  (VITAMIN B-12) 5000 MCG TBDP Take 5,000 mcg by mouth in the morning.    [provider]  cyclobenzaprine  (FLEXERIL ) 10 MG tablet Take 10 mg by mouth at bedtime. 07/05/16   [provider]  Dulaglutide (TRULICITY) 1.5 MG/0.5ML SOAJ Inject 1.5 mg into the skin once a week.  Saturday 07/28/23 07/27/24  [provider]  gabapentin  (NEURONTIN ) 100 MG capsule Take 200 mg by mouth with breakfast, with lunch, and with evening meal.    [provider]  gabapentin  (NEURONTIN ) 300 MG capsule Take 300 mg by mouth at bedtime.    [provider]  glipiZIDE  (GLUCOTROL ) 10 MG tablet Take 10 mg by mouth daily before breakfast.    [provider]  HYDROcodone  bit-homatropine (HYCODAN) 5-1.5 MG/5ML syrup Take 5 mLs by mouth every 8 (eight) hours as needed for cough. 04/03/24   Rao, Archana C, MD  LORazepam (ATIVAN) 0.5 MG tablet Take 1 tablet by mouth 30 minutes prior to MRI scan. May take additional tablet if needed. 04/04/24   Melanee Annah BROCKS, MD  Magnesium  Oxide -Mg Supplement 500 MG TABS Take 1 tablet by mouth at bedtime.    [provider]  mometasone-formoterol  (DULERA) 100-5 MCG/ACT AERO Inhale 2 puffs into the lungs 2 (two) times daily. 04/03/24   Fernand Elfreda LABOR, MD  omeprazole  (PRILOSEC) 20 MG capsule Take 1 capsule (20 mg total) by mouth daily. 08/30/23   Dasie Tinnie MATSU, NP  ondansetron  (ZOFRAN ) 8 MG tablet TAKE 1 TABLET BY MOUTH EVERY 8 HOURS AS NEEDED FOR NAUSEA OR VOMITING. START ON THE 3RD DAY AFTER CHEMOTHERAPY 08/15/23   Melanee Annah BROCKS, MD  [facility] ULTRA TEST test strip  01/25/23   [provider]  osimertinib  mesylate (TAGRISSO ) 40 MG tablet Take 1 tablet (40 mg total) by mouth daily. 02/17/24   Melanee Annah BROCKS, MD  oxybutynin  (DITROPAN ) 5 MG tablet Take 1 tablet by mouth 2 (two) times daily. 02/25/22   [provider]  oxyCODONE  (OXY IR/ROXICODONE ) 5 MG immediate release tablet Take 1 tablet (5 mg total) by mouth every 6 (six) hours as needed for severe pain (pain score 7-10). 04/02/24   Melanee Annah BROCKS, MD  OXYGEN Inhale 2 L into the lungs.    [provider]  prochlorperazine  (COMPAZINE ) 10 MG tablet Take 1 tablet (10 mg total) by mouth every 6 (six) hours as needed for nausea or vomiting. 08/30/23   Dasie Tinnie MATSU, NP  rOPINIRole  (REQUIP ) 1 MG tablet Take 1 mg by mouth at bedtime. 06/22/23   [provider]  sucralfate  (CARAFATE ) 1 g tablet Take 1 tablet (1 g total) by mouth 3 (three) times daily with meals. Dissolve tablet in 4 tablesppons warm water swish and swallow 30 min before meals. Patient taking differently: Take 1 g by mouth 3 (three) times daily with meals. Dissolve tablet in 4 tablespoons warm water swish and swallow 30 min before meals. 07/22/23 04/02/24  Lenn Aran, MD  warfarin (COUMADIN ) 5 MG tablet Take 5 mg by mouth daily. 06/01/23   [provider]     Family History  Problem Relation Age of Onset   Diabetes Mother    Heart disease Mother    Cancer Mother    Breast cancer Mother        >  50   Heart disease Father    Diabetes Sister    Heart disease Sister     Social History   Socioeconomic History   Marital status: Married    Spouse name: Butler   Number of children: 1   Years of education: Not on file   Highest education level: Not on file  Occupational History   Not on file  Tobacco Use   Smoking status: Never    Passive exposure: Current (husband smokes)   Smokeless tobacco: Never  Vaping Use   Vaping status: Never Used  Substance and Sexual Activity   Alcohol  use: No    Alcohol /week: 0.0 standard drinks of alcohol    Drug use: No   Sexual activity: Never  Other Topics Concern   Not on file  Social History Narrative   Not on file   Social Drivers of Health   Financial Resource Strain: Low Risk  (05/19/2023)   Received from [facility]   Overall Financial Resource Strain (CARDIA)    Difficulty of Paying Living Expenses: Not hard at all  Food Insecurity: No Food Insecurity (12/12/2023)   Hunger Vital Sign    Worried About Running Out of Food in the Last Year: Never true    Ran Out of Food in the Last Year: Never true  Transportation Needs: No Transportation Needs (12/12/2023)   PRAPARE - Therapist, Art (Medical): No    Lack of Transportation (Non-Medical): No  Physical Activity: Not on file  Stress: Not on file  Social Connections: Moderately Isolated (12/12/2023)   Social Connection and Isolation Panel    Frequency of Communication with Friends and Family: More than three times a week    Frequency of Social Gatherings with Friends and Family: Once a week    Attends Religious Services: Never    Database Administrator or Organizations: No    Attends Banker Meetings: Never    Marital Status: Married    Review of Systems  Vital Signs: There were no vitals taken for this visit.    Physical Exam    Imaging: MR Thoracic Spine W Wo Contrast Result Date: 04/06/2024 CLINICAL DATA:  Initial evaluation for compression fracture, back pain. EXAM: MRI THORACIC WITHOUT AND WITH CONTRAST TECHNIQUE: Multiplanar and multiecho pulse sequences of the thoracic spine were obtained without and with intravenous contrast. CONTRAST:  7.5mL GADAVIST GADOBUTROL 1 MMOL/ML IV SOLN COMPARISON:  Comparison made with prior CT from 03/21/2024. FINDINGS: Alignment: Mild levoscoliosis with exaggeration of the normal thoracic kyphosis. No listhesis. Vertebrae: There is an acute/recent compression fracture involving the superior endplate of T5. Fracture line traverses the T4-5 interspace with extension through the posterior cortex. Associated height loss measures up to 40% with trace 3 mm retropulsion. No significant stenosis. No visible underlying pathologic lesion by MRI. Prominent endplate Schmorl's node deformity with reactive marrow edema present at the adjacent inferior endplate of T4. This appears somewhat progressed as compared to prior CT. Associated mild height loss without retropulsion. Otherwise, vertebral body height maintained with no other acute or chronic fracture. Bone marrow signal intensity overall within normal limits. Few scattered benign hemangiomata noted. No  worrisome osseous lesions. No other abnormal marrow edema or enhancement. Cord:  Normal signal morphology. No abnormal enhancement. Paraspinal and other soft tissues: Paraspinous soft tissues demonstrate no acute finding. Bandlike consolidative opacity within the partially visualized right upper and lower lobes. Associated pleuroparenchymal thickening about the visualized right lung with small  layering right pleural effusion. Findings are similar to prior CT with these changes are better evaluated. Disc levels: T1-2:  Unremarkable. T2-3: Normal interspace. Mild right-sided facet spurring. No canal or foraminal stenosis. T3-4:  Minimal disc bulge.  No canal or foraminal stenosis. T4-5:  Mild disc bulge.  Minimal facet hypertrophy.  No stenosis. T5-6: Mild disc bulge. 3 mm bony retropulsion related to the T5 compression fracture indents the central to right paracentral thecal sac (series 21, image 18). Mild cord flattening without cord signal changes or significant spinal stenosis. Foramina remain patent. T6-7: Left paracentral disc protrusion indents the left ventral thecal sac (series 21, image 20). Mild cord flattening without cord signal changes or significant spinal stenosis. Foramina remain patent. T7-8: Tiny right paracentral disc protrusion indents the right ventral thecal sac without stenosis (series 21, image 24). T8-9: Chronic endplate Schmorl's node deformity without significant disc bulge or stenosis. T9-10: Right paracentral disc extrusion with superior migration without significant stenosis (series 21, image 29). T10-11: Disc desiccation with mild disc bulge and endplate spurring. Chronic endplate Schmorl's node deformity. Mild facet hypertrophy. No stenosis. T11-12:  Minimal disc bulge.  No stenosis. T12-L1: Seen only on sagittal projection. Mild diffuse disc bulge with bilateral facet hypertrophy. No significant stenosis. IMPRESSION: 1. Acute/recent compression fracture involving the superior endplate  of T5 with up to 40% height loss and trace 3 mm bony retropulsion. No significant stenosis. 2. Prominent endplate Schmorl's node deformity with reactive marrow edema at the adjacent inferior endplate of T4. This appears somewhat progressed as compared to prior CT, and could also contribute to back pain. 3. Mild multilevel degenerative spondylosis with a few scattered small disc protrusions as detailed above. No significant stenosis. 4. Bandlike consolidative opacity within the partially visualized right upper and lower lobes with associated pleuroparenchymal thickening and small layering right pleural effusion. Findings are similar to prior chest CT with these changes are better evaluated. Electronically Signed   By: Morene Hoard M.D.   On: 04/06/2024 19:20   CT CHEST W CONTRAST Result Date: 03/21/2024 EXAM: CT CHEST WITH CONTRAST 03/21/2024 10:11:40 AM TECHNIQUE: CT of the chest was performed with the administration of 80 mL of iohexol  (OMNIPAQUE ) 300 MG/ML solution. Multiplanar reformatted images are provided for review. Automated exposure control, iterative reconstruction, and/or weight based adjustment of the mA/kV was utilized to reduce the radiation dose to as low as reasonably achievable. COMPARISON: 12/11/2023 CLINICAL HISTORY: Malignant neoplasm of lung (HCC) and bronchitis. FINDINGS: MEDIASTINUM: Myocardiomegaly. Thoracic aortic, coronary artery, and branch vessel atheromatous vascular calcifications. No acute pulmonary embolism is identified. 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 Pericardium is unremarkable. The central airways are clear. LYMPH NODES: No mediastinal, hilar or axillary lymphadenopathy. LUNGS AND PLEURA: Bandlike consolidation in the right upper lobe and superior segment right lower lobe with some associated volume loss and associated airway plugging in the superior segment right lower lobe. The  consolidation appears reduced in volume but more confluent compared to 12/11/2023. This may well be the result of prior radiation therapy. There are some patchy ground glass opacities in the right upper lobe suggesting alveolitis. A well-defined mass separate from the band of consolidation is not well seen. Mild peripheral bandlike and ground glass opacities in the left upper lobe and left lower lobe, improved from previous. 3 mm left upper lobe nodule shown on image 57 series 4, stable. Trace right pleural effusion with right pleural thickening and  some pleural enhancement and mild pleural nodularity along the diaphragm posteriorly on the right. Pleural metastatic disease not excluded. Prior left pleural effusion has resolved. No pneumothorax. No pulmonary edema. SOFT TISSUES/BONES: Thoracic spondylosis. New 40 percent superior endplate compression fracture at the T5 vertebral level. No acute abnormality of the soft tissues. UPPER ABDOMEN: Limited images of the upper abdomen demonstrate cholelithiasis. IMPRESSION: 1. No acute pulmonary embolism. 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 2. Bandlike consolidation in the right upper lobe and superior segment right lower lobe with associated volume loss and airway plugging, possibly related to prior radiation therapy. Patchy ground glass opacities in the right upper lobe suggest alveolitis. No well-defined separate mass is identified. 3. Trace right pleural effusion with right pleural thickening and enhancement, with mild right diaphragmatic pleural nodularity; pleural metastatic disease is not excluded although the the region was not significantly hypermetabolic on 01/13/2024 PET-CT. 4. Mild peripheral bandlike and ground glass opacities in the left upper and left lower lobes, improved from prior study. 5. Stable 3 mm left upper lobe pulmonary nodule; no routine follow-up imaging is recommended per Fleischner Society  Guidelines. 6. New approximately 40% superior endplate compression fracture at T5. 7. Thoracic spondylosis. Electronically signed by: Ryan Salvage MD 03/21/2024 10:43 AM EDT RP Workstation: [id number]    Labs:  CBC: Recent Labs    12/20/23 0541 01/25/24 1459 01/30/24 1455 03/09/24 0924  WBC 5.9 3.6* 4.2 5.2  HGB 10.1* 11.3* 11.5* 11.8*  HCT 31.5* 35.6* 36.0 35.8*  PLT 127* 78* 94* 162    COAGS: Recent Labs    08/02/23 1108 08/02/23 1440 12/17/23 0306 12/18/23 0401 12/19/23 0137 12/20/23 0541  INR 9.8*   < > 2.2* 2.4* 2.4* 3.0*  APTT 49*  --   --   --   --   --    < > = values in this interval not displayed.    BMP: Recent Labs    12/20/23 0541 01/25/24 1459 01/30/24 1455 03/09/24 0924  NA 138 136 135 137  K 4.0 3.6 3.8 4.4  CL 102 102 102 104  CO2 28 26 24 25   GLUCOSE 231* 135* 174* 243*  BUN 22 9 9 14   CALCIUM  8.3* 9.0 8.8* 9.0  CREATININE 0.84 0.85 0.86 0.82  GFRNONAA >60 >60 >60 >60    LIVER FUNCTION TESTS: Recent Labs    12/07/23 1858 12/11/23 2106 01/25/24 1459 03/09/24 0924  BILITOT 0.9 0.6 0.4 0.3  AST 31 16 20 19   ALT 18 10 8 10   ALKPHOS 61 40 39 67  PROT 7.7 7.5 7.2 8.2*  ALBUMIN 3.4* 2.8* 3.3* 3.2*    TUMOR MARKERS: No results for input(s): AFPTM, CEA, CA199, CHROMGRNA in the last 8760 hours.  Assessment and Plan:  73 year old female with a history of recurrent, metastatic lung cancer and progressively worsening severe back pain. Recent Chest CT and follow up MR Thoracic Spine imaging concerning for acute T5 superior endplate fracture, likely contributing to patient's worsening non-cancer related back pain. Given her uncontrolled pain and need for increased opioid medications to manage her pain, she was referred to IR for consideration and evaluation of Thoracic Kyphoplasty.  Patient scored a ***/24 on the Roland-Morris Disability Questionnaire.  She continues to report ***  After thorough discussion with patient today  regarding the risks and benefits of thoracic kyphoplasty, patient is *** Plan for ***    Thank you for this interesting consult.  I greatly enjoyed meeting Kellogg  Mcpherson and look forward to participating in their care.  A copy of this report was sent to the requesting provider on this date.  Electronically Signed: Glennon CHRISTELLA Bal 04/12/2024, 8:43 AM   I spent a total of {New Out-Pt:[id number]} in face to face in clinical consultation, greater than 50% of which was counseling/coordinating care for thoracic kyphoplasty.

## 2024-04-12 NOTE — Patient Instructions (Addendum)
 ______________________________________________________________________    Post-Radiofrequency (RF) Discharge Instructions  You have just completed a Radiofrequency Neurotomy.  The following instructions will provide you with information and guidelines for self-care upon discharge.  If at any time you have questions or concerns please call your physician. DO NOT DRIVE YOURSELF!!  Instructions: Apply ice: Fill a plastic sandwich bag with crushed ice. Cover it with a small towel and apply to injection site. Apply for 15 minutes then remove x 15 minutes. Repeat sequence on day of procedure, until you go to bed. The purpose is to minimize swelling and discomfort after procedure. Apply heat: Apply heat to procedure site starting the day following the procedure. The purpose is to treat any soreness and discomfort from the procedure. Food intake: No eating limitations, unless stipulated above.  Nevertheless, if you have had sedation, you may experience some nausea.  In this case, it may be wise to wait at least two hours prior to resuming regular diet. Physical activities: Keep activities to a minimum for the first 8 hours after the procedure. For the first 24 hours after the procedure, do not drive a motor vehicle,  Operate heavy machinery, power tools, or handle any weapons.  Consider walking with the use of an assistive device or accompanied by an adult for the first 24 hours.  Do not drink alcoholic beverages including beer.  Do not make any important decisions or sign any legal documents. Go home and rest today.  Resume activities tomorrow, as tolerated.  Use caution in moving about as you may experience mild leg weakness.  Use caution in cooking, use of household electrical appliances and climbing steps. Driving: If you have received any sedation, you are not allowed to drive for 24 hours after your procedure. Blood thinner: Restart your blood thinner 6 hours after your procedure. (Only for those taking  blood thinners) Insulin : As soon as you can eat, you may resume your normal dosing schedule. (Only for those taking insulin ) Medications: May resume pre-procedure medications.  Do not take any drugs, other than what has been prescribed to you. Infection prevention: Keep procedure site clean and dry. Post-procedure Pain Diary: Extremely important that this be done correctly and accurately. Recorded information will be used to determine the next step in treatment. Pain evaluated is that of treated area only. Do not include pain from an untreated area. Complete every hour, on the hour, for the initial 8 hours. Set an alarm to help you do this part accurately. Do not go to sleep and have it completed later. It will not be accurate. Follow-up appointment: Keep your follow-up appointment after the procedure. Usually 2-6 weeks after radiofrequency. Bring you pain diary. The information collected will be essential for your long-term care.   Expect: From numbing medicine (AKA: Local Anesthetics): Numbness or decrease in pain. Onset: Full effect within 15 minutes of injected. Duration: It will depend on the type of local anesthetic used. On the average, 1 to 8 hours.  From steroids (when added): Decrease in swelling or inflammation. Once inflammation is improved, relief of the pain will follow. Onset of benefits: Depends on the amount of swelling present. The more swelling, the longer it will take for the benefits to be seen. In some cases, up to 10 days. Duration: Steroids will stay in the system x 2 weeks. Duration of benefits will depend on multiple posibilities including persistent irritating factors. From procedure: Some discomfort is to be expected once the numbing medicine wears off. In the case of  radiofrequency procedures, this may last as long as 6-8 weeks. Additional post-procedure pain medication is provided for this. Discomfort is minimized if ice and heat are applied as instructed.  Call  if: You experience numbness and weakness that gets worse with time, as opposed to wearing off. He experience any unusual bleeding, difficulty breathing, or loss of the ability to control your bowel and bladder. (This applies to Spinal procedures only) You experience any redness, swelling, heat, red streaks, elevated temperature, fever, or any other signs of a possible infection.  Emergency Numbers: Durning business hours (Monday - Thursday, 8:00 AM - 4:00 PM) (Friday, 9:00 AM - 12:00 Noon): [phone] After hours: [phone] ______________________________________________________________________     ______________________________________________________________________    Opioid Pain Medication Update  To: All patients taking opioid pain medications. (I.e.: hydrocodone , hydromorphone , oxycodone , oxymorphone, morphine , codeine , methadone, tapentadol, tramadol , buprenorphine, fentanyl , etc.)  Re: Updated review of side effects and adverse reactions of opioid analgesics, as well as new information about long term effects of this class of medications.  Direct risks of long-term opioid therapy are not limited to opioid addiction and overdose. Potential medical risks include serious fractures, breathing problems during sleep, hyperalgesia, immunosuppression, chronic constipation, bowel obstruction, myocardial infarction, and tooth decay secondary to xerostomia.  Unpredictable adverse effects that can occur even if you take your medication correctly: Cognitive impairment, respiratory depression, and death. Most people think that if they take their medication correctly, and as instructed, that they will be safe. Nothing could be farther from the truth. In reality, a significant amount of recorded deaths associated with the use of opioids has occurred in individuals that had taken the medication for a long time, and were taking their medication correctly. The following are examples of how this  can happen: Patient taking his/her medication for a long time, as instructed, without any side effects, is given a certain antibiotic or another unrelated medication, which in turn triggers a Drug-to-drug interaction leading to disorientation, cognitive impairment, impaired reflexes, respiratory depression or an untoward event leading to serious bodily harm or injury, including death.  Patient taking his/her medication for a long time, as instructed, without any side effects, develops an acute impairment of liver and/or kidney function. This will lead to a rapid inability of the body to breakdown and eliminate their pain medication, which will result in effects similar to an overdose, but with the same medicine and dose that they had always taken. This again may lead to disorientation, cognitive impairment, impaired reflexes, respiratory depression or an untoward event leading to serious bodily harm or injury, including death.  A similar problem will occur with patients as they grow older and their liver and kidney function begins to decrease as part of the aging process.  Background information: Historically, the original case for using long-term opioid therapy to treat chronic noncancer pain was based on safety assumptions that subsequent experience has called into question. In 1996, the American Pain Society and the American Academy of Pain Medicine issued a consensus statement supporting long-term opioid therapy. This statement acknowledged the dangers of opioid prescribing but concluded that the risk for addiction was low; respiratory depression induced by opioids was short-lived, occurred mainly in opioid-naive patients, and was antagonized by pain; tolerance was not a common problem; and efforts to control diversion should not constrain opioid prescribing. This has now proven to be wrong. Experience regarding the risks for opioid addiction, misuse, and overdose in community practice has failed to  support these assumptions.  According  to the Centers for Disease Control and Prevention, fatal overdoses involving opioid analgesics have increased sharply over the past decade. Currently, more than 96,700 people die from drug overdoses every year. Opioids are a factor in 7 out of every 10 overdose deaths. Deaths from drug overdose have surpassed motor vehicle accidents as the leading cause of death for individuals between the ages of 61 and 34.  Clinical data suggest that neuroendocrine dysfunction may be very common in both men and women, potentially causing hypogonadism, erectile dysfunction, infertility, decreased libido, osteoporosis, and depression. Recent studies linked higher opioid dose to increased opioid-related mortality. Controlled observational studies reported that long-term opioid therapy may be associated with increased risk for cardiovascular events. Subsequent meta-analysis concluded that the safety of long-term opioid therapy in elderly patients has not been proven.   Side Effects and adverse reactions: Common side effects: Drowsiness (sedation). Dizziness. Nausea and vomiting. Constipation. Physical dependence -- Dependence often manifests with withdrawal symptoms when opioids are discontinued or decreased. Tolerance -- As you take repeated doses of opioids, you require increased medication to experience the same effect of pain relief. Respiratory depression -- This can occur in healthy people, especially with higher doses. However, people with COPD, asthma or other lung conditions may be even more susceptible to fatal respiratory impairment.  Uncommon side effects: An increased sensitivity to feeling pain and extreme response to pain (hyperalgesia). Chronic use of opioids can lead to this. Delayed gastric emptying (the process by which the contents of your stomach are moved into your small intestine). Muscle rigidity. Immune system and hormonal dysfunction. Quick,  involuntary muscle jerks (myoclonus). Arrhythmia. Itchy skin (pruritus). Dry mouth (xerostomia).  Long-term side effects: Chronic constipation. Sleep-disordered breathing (SDB). Increased risk of bone fractures. Hypothalamic-pituitary-adrenal dysregulation. Increased risk of overdose.  RISKS: Respiratory depression and death: Opioids increase the risk of respiratory depression and death.  Drug-to-drug interactions: Opioids are relatively contraindicated in combination with benzodiazepines, sleep inducers, and other central nervous system depressants. Other classes of medications (i.e.: certain antibiotics and even over-the-counter medications) may also trigger or induce respiratory depression in some patients.  Medical conditions: Patients with pre-existing respiratory problems are at higher risk of respiratory failure and/or depression when in combination with opioid analgesics. Opioids are relatively contraindicated in some medical conditions such as central sleep apnea.   Fractures and Falls:  Opioids increase the risk and incidence of falls. This is of particular importance in elderly patients.  Endocrine System:  Long-term administration is associated with endocrine abnormalities (endocrinopathies). (Also known as Opioid-induced Endocrinopathy) Influences on both the hypothalamic-pituitary-adrenal axis?and the hypothalamic-pituitary-gonadal axis have been demonstrated with consequent hypogonadism and adrenal insufficiency in both sexes. Hypogonadism and decreased levels of dehydroepiandrosterone sulfate have been reported in men and women. Endocrine effects include: Amenorrhoea in women (abnormal absence of menstruation) Reduced libido in both sexes Decreased sexual function Erectile dysfunction in men Hypogonadisms (decreased testicular function with shrinkage of testicles) Infertility Depression and fatigue Loss of muscle mass Anxiety Depression Immune  suppression Hyperalgesia Weight gain Anemia Osteoporosis Patients (particularly women of childbearing age) should avoid opioids. There is insufficient evidence to recommend routine monitoring of asymptomatic patients taking opioids in the long-term for hormonal deficiencies.  Immune System: Human studies have demonstrated that opioids have an immunomodulating effect. These effects are mediated via opioid receptors both on immune effector cells and in the central nervous system. Opioids have been demonstrated to have adverse effects on antimicrobial response and anti-tumour surveillance. Buprenorphine has been demonstrated to have no impact on immune  function.  Opioid Induced Hyperalgesia: Human studies have demonstrated that prolonged use of opioids can lead to a state of abnormal pain sensitivity, sometimes called opioid induced hyperalgesia (OIH). Opioid induced hyperalgesia is not usually seen in the absence of tolerance to opioid analgesia. Clinically, hyperalgesia may be diagnosed if the patient on long-term opioid therapy presents with increased pain. This might be qualitatively and anatomically distinct from pain related to disease progression or to breakthrough pain resulting from development of opioid tolerance. Pain associated with hyperalgesia tends to be more diffuse than the pre-existing pain and less defined in quality. Management of opioid induced hyperalgesia requires opioid dose reduction.  Cancer: Chronic opioid therapy has been associated with an increased risk of cancer among noncancer patients with chronic pain. This association was more evident in chronic strong opioid users. Chronic opioid consumption causes significant pathological changes in the small intestine and colon. Epidemiological studies have found that there is a link between opium  dependence and initiation of gastrointestinal cancers. Cancer is the second leading cause of death after cardiovascular disease.  Chronic use of opioids can cause multiple conditions such as GERD, immunosuppression and renal damage as well as carcinogenic effects, which are associated with the incidence of cancers.   Mortality: Long-term opioid use has been associated with increased mortality among patients with chronic non-cancer pain (CNCP).  Prescription of long-acting opioids for chronic noncancer pain was associated with a significantly increased risk of all-cause mortality, including deaths from causes other than overdose.  Reference: Von Korff M, Kolodny A, Deyo RA, Chou R. Long-term opioid therapy reconsidered. Ann Intern Med. 2011 Sep 6;155(5):325-8. doi: 10.7326/[id number]. PMID: 78106373; PMCID: EFR6719914. Kit JINNY Laurence CINDERELLA Pearley JINNY, Hayward RA, Dunn KM, Jordan KP. Risk of adverse events in patients prescribed long-term opioids: A cohort study in the [place] Clinical [employer]. Eur J Pain. 2019 May;23(5):908-922. doi: 10.1002/ejp.1357. Epub 2019 Jan 31. PMID: 69379883. Colameco S, Coren JS, Ciervo CA. Continuous opioid treatment for chronic noncancer pain: a time for moderation in prescribing. Postgrad Med. 2009 Jul;121(4):61-6. doi: 10.3810/pgm.2009.07.2032. PMID: 80358728. Gigi JONELLE Shlomo MILUS Levern IVER Conny RN, [place], Blazina I, Lonell DASEN, Bougatsos C, Deyo RA. The effectiveness and risks of long-term opioid therapy for chronic pain: a systematic review for a [employer] to [employer]. Ann Intern Med. 2015 Feb 17;162(4):276-86. doi: 10.7326/M14-2559. PMID: 74418742. Rory CHRISTELLA [facility], [place] DM. NCHS Data Brief No. 22. [place]: Centers for Disease Control and Prevention; 2009. Sep, Increase in Fatal Poisonings Involving Opioid Analgesics in the [place] , 1999-2006. Song IA, Choi HR, Oh TK. Long-term opioid use and mortality in patients with chronic non-cancer pain: Ten-year follow-up study in [place] from 2010 through 2019.  EClinicalMedicine. 2022 Jul 18;51:101558. doi: 10.1016/j.eclinm.2022.898441. PMID: 64124182; PMCID: EFR0695089. Huser, W., Schubert, T., Vogelmann, T. et al. All-cause mortality in patients with long-term opioid therapy compared with non-opioid analgesics for chronic non-cancer pain: a database study. BMC Med 18, 162 (2020). http://lester.info/ Rashidian H, Zendehdel K, Kamangar F, Malekzadeh R, Haghdoost AA. An Ecological Study of the Association between Opiate Use and Incidence of Cancers. Addict Health. 2016 Fall;8(4):252-260. PMID: 71180443; PMCID: EFR4445194.  Our Goal: Our goal is to control your pain with means other than the use of opioid pain medications.  Our Recommendation: Talk to your physician about coming off of these medications. We can assist you with the tapering down and stopping these medicines. Based on the new information, even if you cannot completely stop the medication, a decrease in  the dose may be associated with a lesser risk. Ask for other means of controlling the pain. Decrease or eliminate those factors that significantly contribute to your pain such as smoking, obesity, and a diet heavily tilted towards inflammatory nutrients.  Last Updated: 11/29/2022   ______________________________________________________________________       ______________________________________________________________________    Blood Thinners  IMPORTANT NOTICE:  If you take any of these, make sure to notify the nursing staff.  Failure to do so may result in serious injury.  Recommended time intervals to stop and restart blood-thinners, before & after invasive procedures  Generic Name Brand Name Pre-procedure: Stop medication for this amount of time before your procedure: Post-procedure: Wait this amount of time after the procedure before restarting your medication:  Abciximab Reopro 15 days 2 hrs  Alteplase Activase 10 days 10 days  Anagrelide Agrylin     Apixaban  Eliquis  3 days 6 hrs  Cilostazol Pletal 3 days 5 hrs  Clopidogrel Plavix 7-10 days 2 hrs  Dabigatran Pradaxa 5 days 6 hrs  Dalteparin Fragmin 24 hours 4 hrs  Dipyridamole Aggrenox 11days 2 hrs  Edoxaban Lixiana; Savaysa 3 days 2 hrs  Enoxaparin   Lovenox  24 hours 4 hrs  Eptifibatide Integrillin 8 hours 2 hrs  Fondaparinux  Arixtra 72 hours 12 hrs  Hydroxychloroquine Plaquenil 11 days   Prasugrel Effient 7-10 days 6 hrs  Reteplase Retavase 10 days 10 days  Rivaroxaban  Xarelto  3 days 6 hrs  Ticagrelor Brilinta 5-7 days 6 hrs  Ticlopidine Ticlid 10-14 days 2 hrs  Tinzaparin Innohep 24 hours 4 hrs  Tirofiban Aggrastat 8 hours 2 hrs  Warfarin Coumadin  5 days 2 hrs   Other medications with blood-thinning effects  NOTE: Consider stopping these if you have prolonged bleeding despite not taking any of the above blood thinners. Otherwise ask your provider and this will be decided on a case-by-case basis.  Product indications Generic (Brand) names Note  Cholesterol Lipitor Stop 4 days before procedure  Blood thinner (injectable) Heparin  (LMW or LMWH Heparin ) Stop 24 hours before procedure  Cancer Ibrutinib (Imbruvica) Stop 7 days before procedure  Malaria/Rheumatoid Hydroxychloroquine (Plaquenil) Stop 11 days before procedure  Thrombolytics  10 days before or after procedures   Over-the-counter (OTC) Products with blood-thinning effects  NOTE: Consider stopping these if you have prolonged bleeding despite not taking any of the above blood thinners. Otherwise ask your provider and this will be decided on a case-by-case basis.  Product Common names Stop Time  Aspirin  > 325 mg Goody Powders, Excedrin, etc. 11 days  Aspirin  <= 81 mg  7 days  Fish oil  4 days  Garlic supplements  7 days  Ginkgo biloba  36 hours  Ginseng  24 hours  NSAIDs Ibuprofen, Naprosyn , etc. 3 days  Vitamin E  4 days   ______________________________________________________________________     Moderate  Conscious Sedation, Adult, Care After After the procedure, it is common to have: Sleepiness for a few hours. Impaired judgment for a few hours. Trouble with balance. Nausea or vomiting if you eat too soon. Follow these instructions at home: For the time period you were told by your health care provider:  Rest. Do not participate in activities where you could fall or become injured. Do not drive or use machinery. Do not drink alcohol . Do not take sleeping pills or medicines that cause drowsiness. Do not make important decisions or sign legal documents. Do not take care of children on your own. Eating and drinking Follow instructions from your health  care provider about what you may eat and drink. Drink enough fluid to keep your urine pale yellow. If you vomit: Drink clear fluids slowly and in small amounts as you are able. Clear fluids include water, ice chips, low-calorie sports drinks, and fruit juice that has water added to it (diluted fruit juice). Eat light and bland foods in small amounts as you are able. These foods include bananas, applesauce, rice, lean meats, toast, and crackers. General instructions Take over-the-counter and prescription medicines only as told by your health care provider. Have a responsible adult stay with you for the time you are told. Do not use any products that contain nicotine or tobacco. These products include cigarettes, chewing tobacco, and vaping devices, such as e-cigarettes. If you need help quitting, ask your health care provider. Return to your normal activities as told by your health care provider. Ask your health care provider what activities are safe for you. Your health care provider may give you more instructions. Make sure you know what you can and cannot do. Contact a health care provider if: You are still sleepy or having trouble with balance after 24 hours. You feel light-headed. You vomit every time you eat or drink. You get a  rash. You have a fever. You have redness or swelling around the IV site. Get help right away if: You have trouble breathing. You start to feel confused at home. These symptoms may be an emergency. Get help right away. Call 911. Do not wait to see if the symptoms will go away. Do not drive yourself to the hospital. This information is not intended to replace advice given to you by your health care provider. Make sure you discuss any questions you have with your health care provider. Document Revised: 11/23/2021 Document Reviewed: 11/23/2021 Elsevier Patient Education  2024 [employer].

## 2024-04-12 NOTE — Progress Notes (Signed)
 PATIENT VERY SOB WITH EXERTION GETTING ON BED.

## 2024-04-12 NOTE — Progress Notes (Signed)
 PROVIDER NOTE: Interpretation of information contained herein should be left to medically-trained personnel. Specific patient instructions are provided elsewhere under Patient Instructions section of medical record. This document was created in part using STT-dictation technology, any transcriptional errors that may result from this process are unintentional.  Patient: Kara [name] Type: Established DOB: [birth year] MRN: [id number] PCP: Sadie Manna, MD  Service: Procedure DOS: 04/12/2024 Setting: Ambulatory Location: Ambulatory outpatient facility Delivery: Face-to-face Provider: Eric DELENA Como, MD Specialty: Interventional Pain Management Specialty designation: 09 Location: Outpatient facility Ref. Prov.: Sadie Manna, MD       Interventional Therapy   Procedure: Lumbar Facet, Medial Branch Radiofrequency Ablation (RFA) #2  Laterality: Left (-LT)  Level: L2, L3, L4, L5, and S1 Medial Branch Level(s). These levels will denervate the L3-4, L4-5, and L5-S1 lumbar facet joints.  Imaging: Fluoroscopy-guided Spinal ([id number]) Anesthesia: Local anesthesia (1-2% Lidocaine ) Anxiolysis: IV Versed  1.0 mg            Sedation: Minimal Sedation None required. No Fentanyl  administered.         DOS: 04/12/2024  Performed by: Eric DELENA Como, MD  Purpose: Therapeutic/Palliative Indications: Low back pain severe enough to impact quality of life or function. Indications: 1. Chronic low back pain (1ry area of Pain) (Bilateral) w/o sciatica   2. Lumbar facet joint pain   3. 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 Lumbar facet arthropathy (L3-4, L4-5, and L5-S1) (Bilateral)   5. Spondylosis without myelopathy or radiculopathy, lumbosacral region   6. Low back pain of over 3 months duration   7. Lumbar facet syndrome (Bilateral)   8. 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 Osteoarthritis of lumbar spine   10. Spondylolisthesis,  lumbosacral region   11. Intractable low back pain    Chronic anticoagulation (COUMADIN )    Kara [name] has been dealing with the above chronic pain for longer than three months and has either failed to respond, was unable to tolerate, or simply did not get enough benefit from other more conservative therapies including, but not limited to: 1. Over-the-counter medications 2. Anti-inflammatory medications 3. Muscle relaxants 4. Membrane stabilizers 5. Opioids 6. Physical therapy and/or chiropractic manipulation 7. Modalities (Heat, ice, etc.) 8. Invasive techniques such as nerve blocks. Kara [name] has attained more than 50% relief of the pain from a series of diagnostic injections conducted in separate occasions.  Pain Score: Pre-procedure: 8 /10 Post-procedure: 0-No pain/10     Position / Prep / Materials:  Position: Prone  Prep solution: ChloraPrep (2% chlorhexidine  gluconate and 70% isopropyl alcohol ) Prep Area: Entire Lumbosacral Region (Lower back from mid-thoracic region to end of tailbone and from flank to flank.) Materials:  Tray: RFA (Radiofrequency) tray Needle(s):  Type: RFA (Teflon-coated radiofrequency ablation needles) Gauge (G): 22  Length: Regular (10cm) Qty: 5     H&P (Pre-op Assessment):  Kara [name] is a 73 y.o. (year old), female patient, seen today for interventional treatment. She  has a past surgical history that includes Knee surgery (Right); Carpal tunnel release (Bilateral); Colon resection sigmoid (N/A, 11/02/2016); Colostomy (N/A, 11/02/2016); Sigmoidoscopy (N/A, 11/13/2016); PULMONARY VENOGRAPHY (N/A, 11/19/2016); Colonoscopy with propofol  (N/A, 02/03/2017); Colon surgery (11/02/2016); IVC FILTER INSERTION (Right, 10/2016); Colostomy reversal (N/A, 02/24/2017); Appendectomy (N/A, 02/24/2017); Lysis of adhesion (N/A, 02/24/2017); laparoscopy (N/A, 02/24/2017); Ileo loop diversion (N/A, 02/24/2017); Sigmoidoscopy (N/A, 05/19/2017); Ileostomy closure  (N/A, 06/01/2017); IVC FILTER REMOVAL (N/A, 08/09/2017); Sacroplasty (N/A, 11/08/2017); Cataract extraction w/PHACO (Left, 11/21/2018); Cataract extraction w/PHACO (  Right, 12/12/2018); Video bronchoscopy with endobronchial navigation (N/A, 02/23/2021); Flexible bronchoscopy (N/A, 05/16/2023); Endobronchial ultrasound (N/A, 05/16/2023); IR IMAGING GUIDED PORT INSERTION (06/14/2023); and IR REMOVAL TUN ACCESS W/ PORT W/O FL MOD SED (11/16/2023). Kara [name] has a current medication list which includes the following prescription(s): albuterol , bisacodyl , budesonide -formoterol , candesartan, cholecalciferol , vitamin b-12, cyclobenzaprine , trulicity, gabapentin , gabapentin , glipizide , hydrocodone  bit-homatropine, hydrocodone -acetaminophen , [START ON 04/19/2024] hydrocodone -acetaminophen , lorazepam, magnesium  oxide -mg supplement, mometasone-formoterol , omeprazole , ondansetron , onetouch ultra test, osimertinib  mesylate, oxybutynin , oxycodone , oxygen-helium, prochlorperazine , ropinirole , sucralfate , and warfarin, and the following Facility-Administered Medications: fentanyl . Her primarily concern today is the Back Pain (Left, lower)  Initial Vital Signs:  Pulse/HCG Rate: (!) 104ECG Heart Rate: (!) 104 Temp: (!) 96.9 F (36.1 C) Resp: 18 BP: 107/64 SpO2: 92 % (O2 2 liters BNC)  BMI: Estimated body mass index is 25.69 kg/m as calculated from the following:   Height as of this encounter: 5' 3 (1.6 m).   Weight as of this encounter: 145 lb (65.8 kg).  Risk Assessment: Allergies: Reviewed. She is allergic to adhesive [tape], avapro  [irbesartan ], morphine  and codeine , other, and latex.  Allergy Precautions: None required Coagulopathies: Reviewed. None identified.  Blood-thinner therapy: None at this time Active Infection(s): Reviewed. None identified. Kara [name] is afebrile  Site Confirmation: Kara [name] was asked to confirm the procedure and laterality before marking the site Procedure checklist:  Completed Consent: Before the procedure and under the influence of no sedative(s), amnesic(s), or anxiolytics, the patient was informed of the treatment options, risks and possible complications. To fulfill our ethical and legal obligations, as recommended by the American Medical Association's Code of Ethics, I have informed the patient of my clinical impression; the nature and purpose of the treatment or procedure; the risks, benefits, and possible complications of the intervention; the alternatives, including doing nothing; the risk(s) and benefit(s) of the alternative treatment(s) or procedure(s); and the risk(s) and benefit(s) of doing nothing. The patient was provided information about the general risks and possible complications associated with the procedure. These may include, but are not limited to: failure to achieve desired goals, infection, bleeding, organ or nerve damage, allergic reactions, paralysis, and death. In addition, the patient was informed of those risks and complications associated to Spine-related procedures, such as failure to decrease pain; infection (i.e.: Meningitis, epidural or intraspinal abscess); bleeding (i.e.: epidural hematoma, subarachnoid hemorrhage, or any other type of intraspinal or peri-dural bleeding); organ or nerve damage (i.e.: Any type of peripheral nerve, nerve root, or spinal cord injury) with subsequent damage to sensory, motor, and/or autonomic systems, resulting in permanent pain, numbness, and/or weakness of one or several areas of the body; allergic reactions; (i.e.: anaphylactic reaction); and/or death. Furthermore, the patient was informed of those risks and complications associated with the medications. These include, but are not limited to: allergic reactions (i.e.: anaphylactic or anaphylactoid reaction(s)); adrenal axis suppression; blood sugar elevation that in diabetics may result in ketoacidosis or comma; water retention that in patients with history  of congestive heart failure may result in shortness of breath, pulmonary edema, and decompensation with resultant heart failure; weight gain; swelling or edema; medication-induced neural toxicity; particulate matter embolism and blood vessel occlusion with resultant organ, and/or nervous system infarction; and/or aseptic necrosis of one or more joints. Finally, the patient was informed that Medicine is not an exact science; therefore, there is also the possibility of unforeseen or unpredictable risks and/or possible complications that may result in a catastrophic outcome. The patient indicated having understood very clearly. We have given the  patient no guarantees and we have made no promises. Enough time was given to the patient to ask questions, all of which were answered to the patient's satisfaction. Kara [name] has indicated that she wanted to continue with the procedure. Attestation: I, the ordering provider, attest that I have discussed with the patient the benefits, risks, side-effects, alternatives, likelihood of achieving goals, and potential problems during recovery for the procedure that I have provided informed consent. Date  Time: 04/12/2024  9:42 AM  Pre-Procedure Preparation:  Monitoring: As per clinic protocol. Respiration, ETCO2, SpO2, BP, heart rate and rhythm monitor placed and checked for adequate function Safety Precautions: Patient was assessed for positional comfort and pressure points before starting the procedure. Time-out: I initiated and conducted the Time-out before starting the procedure, as per protocol. The patient was asked to participate by confirming the accuracy of the Time Out information. Verification of the correct person, site, and procedure were performed and confirmed by me, the nursing staff, and the patient. Time-out conducted as per Joint Commission's Universal Protocol (UP.01.01.01). Time: 1150 Start Time: 1150 hrs.  Description of Procedure:           Laterality: See above. Levels:  See above. Safety Precautions: Aspiration looking for blood return was conducted prior to all injections. At no point did we inject any substances, as a needle was being advanced. Before injecting, the patient was told to immediately notify me if she was experiencing any new onset of ringing in the ears, or metallic taste in the mouth. No attempts were made at seeking any paresthesias. Safe injection practices and needle disposal techniques used. Medications properly checked for expiration dates. SDV (single dose vial) medications used. After the completion of the procedure, all disposable equipment used was discarded in the proper designated medical waste containers. Local Anesthesia: Protocol guidelines were followed. The patient was positioned over the fluoroscopy table. The area was prepped in the usual manner. The time-out was completed. The target area was identified using fluoroscopy. A 12-in long, straight, sterile hemostat was used with fluoroscopic guidance to locate the targets for each level blocked. Once located, the skin was marked with an approved surgical skin marker. Once all sites were marked, the skin (epidermis, dermis, and hypodermis), as well as deeper tissues (fat, connective tissue and muscle) were infiltrated with a small amount of a short-acting local anesthetic, loaded on a 10cc syringe with a 25G, 1.5-in  Needle. An appropriate amount of time was allowed for local anesthetics to take effect before proceeding to the next step. Technical description of process:  Radiofrequency Ablation (RFA) L2 Medial Branch Nerve RFA: The target area for the L2 medial branch is at the junction of the postero-lateral aspect of the superior articular process and the superior, posterior, and medial edge of the transverse process of L3. Under fluoroscopic guidance, a Radiofrequency needle was inserted until contact was made with os over the superior postero-lateral  aspect of the pedicular shadow (target area). Sensory and motor testing was conducted to properly adjust the position of the needle. Once satisfactory placement of the needle was achieved, the numbing solution was slowly injected after negative aspiration for blood. 2.0 mL of the nerve block solution was injected without difficulty or complication. After waiting for at least 3 minutes, the ablation was performed. Once completed, the needle was removed intact. L3 Medial Branch Nerve RFA: The target area for the L3 medial branch is at the junction of the postero-lateral aspect of the superior articular process and the  superior, posterior, and medial edge of the transverse process of L4. Under fluoroscopic guidance, a Radiofrequency needle was inserted until contact was made with os over the superior postero-lateral aspect of the pedicular shadow (target area). Sensory and motor testing was conducted to properly adjust the position of the needle. Once satisfactory placement of the needle was achieved, the numbing solution was slowly injected after negative aspiration for blood. 2.0 mL of the nerve block solution was injected without difficulty or complication. After waiting for at least 3 minutes, the ablation was performed. Once completed, the needle was removed intact. L4 Medial Branch Nerve RFA: The target area for the L4 medial branch is at the junction of the postero-lateral aspect of the superior articular process and the superior, posterior, and medial edge of the transverse process of L5. Under fluoroscopic guidance, a Radiofrequency needle was inserted until contact was made with os over the superior postero-lateral aspect of the pedicular shadow (target area). Sensory and motor testing was conducted to properly adjust the position of the needle. Once satisfactory placement of the needle was achieved, the numbing solution was slowly injected after negative aspiration for blood. 2.0 mL of the nerve block  solution was injected without difficulty or complication. After waiting for at least 3 minutes, the ablation was performed. Once completed, the needle was removed intact. L5 Medial Branch Nerve RFA: The target area for the L5 medial branch is at the junction of the postero-lateral aspect of the superior articular process of S1 and the superior, posterior, and medial edge of the sacral ala. Under fluoroscopic guidance, a Radiofrequency needle was inserted until contact was made with os over the superior postero-lateral aspect of the pedicular shadow (target area). Sensory and motor testing was conducted to properly adjust the position of the needle. Once satisfactory placement of the needle was achieved, the numbing solution was slowly injected after negative aspiration for blood. 2.0 mL of the nerve block solution was injected without difficulty or complication. After waiting for at least 3 minutes, the ablation was performed. Once completed, the needle was removed intact. S1 Medial Branch Nerve RFA: The target area for the S1 medial branch is located inferior to the junction of the S1 superior articular process and the L5 inferior articular process, posterior, inferior, and lateral to the 6 o'clock position of the L5-S1 facet joint, just superior to the S1 posterior foramen. Under fluoroscopic guidance, the Radiofrequency needle was advanced until contact was made with os over the Target area. Sensory and motor testing was conducted to properly adjust the position of the needle. Once satisfactory placement of the needle was achieved, the numbing solution was slowly injected after negative aspiration for blood. 2.0 mL of the nerve block solution was injected without difficulty or complication. After waiting for at least 3 minutes, the ablation was performed. Once completed, the needle was removed intact. Radiofrequency lesioning (ablation):  Radiofrequency Generator: Medtronic AccurianTM AG 1000 RF  Generator Sensory Stimulation Parameters: 50 Hz was used to locate & identify the nerve, making sure that the needle was positioned such that there was no sensory stimulation below 0.3 V or above 0.7 V. Motor Stimulation Parameters: 2 Hz was used to evaluate the motor component. Care was taken not to lesion any nerves that demonstrated motor stimulation of the lower extremities at an output of less than 2.5 times that of the sensory threshold, or a maximum of 2.0 V. Lesioning Technique Parameters: Standard Radiofrequency settings. (Not bipolar or pulsed.) Temperature Settings: 80 degrees C  Lesioning time: 60 seconds Stationary intra-operative compliance: Compliant  Once the entire procedure was completed, the treated area was cleaned, making sure to leave some of the prepping solution back to take advantage of its long term bactericidal properties.    Illustration of the posterior view of the lumbar spine and the posterior neural structures. Laminae of L2 through S1 are labeled. DPRL5, dorsal primary ramus of L5; DPRS1, dorsal primary ramus of S1; DPR3, dorsal primary ramus of L3; FJ, facet (zygapophyseal) joint L3-L4; I, inferior articular process of L4; LB1, lateral branch of dorsal primary ramus of L1; IAB, inferior articular branches from L3 medial branch (supplies L4-L5 facet joint); IBP, intermediate branch plexus; MB3, medial branch of dorsal primary ramus of L3; NR3, third lumbar nerve root; S, superior articular process of L5; SAB, superior articular branches from L4 (supplies L4-5 facet joint also); TP3, transverse process of L3.  Facet Joint Innervation (* possible contribution)  L1-2 T12, L1 (L2*)  Medial Branch  L2-3 L1, L2 (L3*)                     L3-4 L2, L3 (L4*)                     L4-5 L3, L4 (L5*)                     L5-S1 L4, L5, S1                        Vitals:   04/12/24 1125 04/12/24 1130 04/12/24 1135 04/12/24 1140  BP: 127/61 111/65 120/77 114/60  Pulse:       Resp:  (!) 24 (!) 25 16  Temp:    (!) 97.3 F (36.3 C)  TempSrc:    Temporal  SpO2: 98% 99% 100% 97%  Weight:      Height:        Start Time: 1150 hrs. End Time:   hrs.  Imaging Guidance (Spinal):         Type of Imaging Technique: Fluoroscopy Guidance (Spinal) Indication(s): Fluoroscopy guidance for needle placement to enhance accuracy in procedures requiring precise needle localization for targeted delivery of medication in or near specific anatomical locations not easily accessible without such real-time imaging assistance. Exposure Time: Please see nurses notes. Contrast: None used. Fluoroscopic Guidance: I was personally present during the use of fluoroscopy. Tunnel Vision Technique used to obtain the best possible view of the target area. Parallax error corrected before commencing the procedure. Direction-depth-direction technique used to introduce the needle under continuous pulsed fluoroscopy. Once target was reached, antero-posterior, oblique, and lateral fluoroscopic projection used confirm needle placement in all planes. Images permanently stored in EMR. Interpretation: No contrast injected. I personally interpreted the imaging intraoperatively. Adequate needle placement confirmed in multiple planes. Permanent images saved into the patient's record.  Antibiotic Prophylaxis:   Anti-infectives (From admission, onward)    None      Indication(s): None identified  Post-operative Assessment:  Post-procedure Vital Signs:  Pulse/HCG Rate: (!) 104(!) 108 Temp: (!) 97.3 F (36.3 C) Resp: 16 BP: 114/60 SpO2: 97 %  EBL: None  Complications: No immediate post-treatment complications observed by team, or reported by patient.  Note: The patient tolerated the entire procedure well. A repeat set of vitals were taken after the procedure and the patient was kept under observation following institutional policy, for this type of procedure. Post-procedural neurological  assessment was performed,  showing return to baseline, prior to discharge. The patient was provided with post-procedure discharge instructions, including a section on how to identify potential problems. Should any problems arise concerning this procedure, the patient was given instructions to immediately contact us , at any time, without hesitation. In any case, we plan to contact the patient by telephone for a follow-up status report regarding this interventional procedure.  Comments:  No additional relevant information.  Plan of Care (POC)  Orders:  Orders Placed This Encounter  Procedures   Radiofrequency,Lumbar    Scheduling Instructions:     Side(s): Left-sided     Level: L3-4, L4-5, and L5-S1 Facets (L2, L3, L4, L5, and S1 Medial Branch)     Sedation: With Sedation.     Date: 04/12/2024    Where will this procedure be performed?:   [name]   DG [facility] C-ARM 1-60 MIN NO REPORT    Intraoperative interpretation by procedural physician at [facility].    Standing Status:   Standing    Number of Occurrences:   1    Reason for exam::   Assistance in needle guidance and placement for procedures requiring needle placement in or near specific anatomical locations not easily accessible without such assistance.   Informed Consent Details: Physician/Practitioner Attestation; Transcribe to consent form and obtain patient signature    Nursing Order: Transcribe to consent form and obtain patient signature. Note: Always confirm laterality of pain with Kara [name], before procedure.    Physician/Practitioner attestation of informed consent for procedure/surgical case:   I, the physician/practitioner, attest that I have discussed with the patient the benefits, risks, side effects, alternatives, likelihood of achieving goals and potential problems during recovery for the procedure that I have provided informed consent.    Procedure:   Lumbar Facet Radiofrequency Ablation     Physician/Practitioner performing the procedure:   Sharry Beining A. Tanya, MD    Indication/Reason:   Low Back Pain, with our without leg pain, due to Facet Joint Arthralgia (Joint Pain) known as Lumbar Facet Syndrome, secondary to Lumbar, and/or Lumbosacral Spondylosis (Arthritis of the Spine), without myelopathy or radiculopathy (Nerve Damage).   Care order/instruction: Please confirm that the patient has stopped the Coumadin  (Warfarin) X 5 days prior to procedure or surgery.    Please confirm that the patient has stopped the Coumadin  (Warfarin) X 5 days prior to procedure or surgery.    Standing Status:   Standing    Number of Occurrences:   1   Provide equipment / supplies at bedside    Procedure tray: Radiofrequency Tray Additional material: Large hemostat (x1); Small hemostat (x1); Towels (x8); 4x4 sterile sponge pack (x1) Needle type: Teflon-coated Radiofrequency Needle (Disposable  single use) Size: Regular Quantity: 5    Standing Status:   Standing    Number of Occurrences:   1    Specify:   Radiofrequency Tray   Saline lock IV    Have LR [phone] available and administer at 125 mL/hr if patient becomes hypotensive.    Standing Status:   Standing    Number of Occurrences:   1   Bleeding precautions    Standing Status:   Standing    Number of Occurrences:   1     Analgesic: No chronic opioid analgesics therapy prescribed by our practice, 2ry to Medication Agreement Violation. Oxycodone  IR 10 mg, 1 tab PO qd,  from PCP.  (Getting oxycodone  from PCP in multiple occasions, according to PMP)(11/21/20; 12/26/20; 01/16/21; 02/13/21).  Opioid analgesic pharmacotherapy D/C'ed on 03/03/2021. MME: 20 mg/day.    Medications ordered for procedure: Meds ordered this encounter  Medications   lidocaine  (XYLOCAINE ) 2 % (with pres) injection 400 mg   pentafluoroprop-tetrafluoroeth (GEBAUERS) aerosol   midazolam  (VERSED ) 5 MG/5ML injection 0.5-2 mg    Make sure Flumazenil is available in the  pyxis when using this medication. If oversedation occurs, administer 0.2 mg IV over 15 sec. If after 45 sec no response, administer 0.2 mg again over 1 min; may repeat at 1 min intervals; not to exceed 4 doses (1 mg)   fentaNYL  (SUBLIMAZE ) injection 25-50 mcg    Make sure Narcan  is available in the pyxis when using this medication. In the event of respiratory depression (RR< 8/min): Titrate NARCAN  (naloxone ) in increments of 0.1 to 0.2 mg IV at 2-3 minute intervals, until desired degree of reversal.   ropivacaine  (PF) 2 mg/mL (0.2%) (NAROPIN ) injection 9 mL   triamcinolone  acetonide (KENALOG -40) injection 40 mg   HYDROcodone -acetaminophen  (NORCO/VICODIN) 5-325 MG tablet    Sig: Take 1 tablet by mouth every 8 (eight) hours as needed for up to 7 days for severe pain (pain score 7-10). Must last 7 days.    Dispense:  21 tablet    Refill:  0    For acute post-operative pain. Not to be refilled. Must last 7 days.   HYDROcodone -acetaminophen  (NORCO/VICODIN) 5-325 MG tablet    Sig: Take 1 tablet by mouth every 8 (eight) hours as needed for up to 7 days for severe pain (pain score 7-10). Must last for 7 days.    Dispense:  21 tablet    Refill:  0    For acute post-operative pain. Not to be refilled. Must last 7 days.   Medications administered: We administered lidocaine , pentafluoroprop-tetrafluoroeth, midazolam , ropivacaine  (PF) 2 mg/mL (0.2%), and triamcinolone  acetonide.  See the medical record for exact dosing, route, and time of administration.    Interventional Therapies  Risk  Complexity Considerations:   ANTICOAGULATION: Coumadin  (Stop: 5 days  Re-start: 2hrs)  Hx DVT & PE ALLERGY: Latex Morbid obesity (class III) (BMI>40)  metabolic syndrome  NIDDM  HTN Difficult Lumbar RFA due to morbid obesity    Planned  Pending: Therapeutic right lumbar facet RFA #2 (03/08/2024)    Under consideration:   Therapeutic left lumbar facet RFA #2    Completed:   Therapeutic bilateral lumbar  facet MBB [id number] (06/21/2023) [facility])  Diagnostic/therapeutic right IA hip inj. x2 (01/28/2022) (100/100/85/85)  Diagnostic/therapeutic left IA hip inj. x1 (01/28/2022) (100/100/85/85)  Diagnostic/therapeutic right trochanteric bursa inj. x1 (09/15/2021) (85/85/85)  Diagnostic/therapeutic left trochanteric bursa inj. x1 (01/28/2022) (100/100/85/85)  Therapeutic left lumbar facet RFA x1 (07/21/2021) (100/100/100 x1 week/80) (Lasted 9 months)  Therapeutic right lumbar facet RFA x1 (07/07/2021) (100/100/80/80) (Lasted 9 months)  Palliative/Therapeutic left L4-5 LESI x1 (11/10/2017) (100/80/0/0-25)  Diagnostic/therapeutic right L4 TFESI x1 (11/19/2021) (90/80/0/0) (fell at [employer]) Palliative right SI joint block x9 (01/28/2022) (100/100/80/80)  Diagnostic left SI joint block x2 (01/28/2022) (100/100/80/80)  Palliative/Therapeutic (Midline) caudal ESI x3 (06/08/2018) (100/100/100 x 2-3 days/0)    Therapeutic  Palliative (PRN) options:   NO PRN procedures without F2F exam by Dr. Jadon Harbaugh due to multifactorial etiology of chronic pain. No more procedures until after 08/14/2022.    Pharmacotherapy  Nonopioids transferred 03/27/2020: Calcium        Follow-up plan:   Return in about 6 weeks (around 05/24/2024) for (Face2F), (PPE).     Recent Visits Date Type Provider Dept  03/27/24 Procedure  visit Tanya Glisson, MD [name]  03/08/24 Procedure visit Tanya Glisson, MD [name]  Showing recent visits within past 90 days and meeting all other requirements Today's Visits Date Type Provider Dept  04/12/24 Procedure visit Tanya Glisson, MD [name]  Showing today's visits and meeting all other requirements Future Appointments Date Type Provider Dept  05/28/24 Appointment Tanya Glisson, MD [name]  Showing future appointments within next 90 days and meeting all other requirements   Disposition: Discharge home  Discharge (Date   Time): 04/12/2024; 1145 hrs.   Primary Care Physician: Sadie Manna, MD Location: [facility] Note by: Glisson DELENA Tanya, MD (TTS technology used. I apologize for any typographical errors that were not detected and corrected.) Date: 04/12/2024; Time: 2:48 PM  Disclaimer:  Medicine is not an visual merchandiser. The only guarantee in medicine is that nothing is guaranteed. It is important to note that the decision to proceed with this intervention was based on the information collected from the patient. The Data and conclusions were drawn from the patient's questionnaire, the interview, and the physical examination. Because the information was provided in large part by the patient, it cannot be guaranteed that it has not been purposely or unconsciously manipulated. Every effort has been made to obtain as much relevant data as possible for this evaluation. It is important to note that the conclusions that lead to this procedure are derived in large part from the available data. Always take into account that the treatment will also be dependent on availability of resources and existing treatment guidelines, considered by other Pain Management Practitioners as being common knowledge and practice, at the time of the intervention. For Medico-Legal purposes, it is also important to point out that variation in procedural techniques and pharmacological choices are the acceptable norm. The indications, contraindications, technique, and results of the above procedure should only be interpreted and judged by a Board-Certified Interventional Pain Specialist with extensive familiarity and expertise in the same exact procedure and technique.

## 2024-04-13 ENCOUNTER — Telehealth: Payer: Self-pay

## 2024-04-13 NOTE — Telephone Encounter (Signed)
 Post procedure follow up. Patient states she is doing fine.

## 2024-04-16 ENCOUNTER — Ambulatory Visit
Admission: RE | Admit: 2024-04-16 | Discharge: 2024-04-16 | Disposition: A | Discharge status: Home / Self Care | Source: Ambulatory Visit | Attending: Oncology | Admitting: Oncology

## 2024-04-16 HISTORY — PX: IR RADIOLOGIST EVAL & MGMT: IMG5224

## 2024-04-16 NOTE — Progress Notes (Signed)
 Reason for visit: Symptomatic T4 / T5 vertebral compression fractures. Failed conservative management Referred at the request of Rao,Archana C  Care Team(s) Primary Care; Sadie Manna, MD Med Onc; Melanee Annah BROCKS, MD Pain Medicine; Tanya Glisson, MD Pulmonology; Fernand Elfreda LABOR, MD  History of Present Illness:  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 Pt is followed by her medical oncology team and is on targeted therapy, w tagrisso  QD. She is also closely followed by pain medicine for chronic low back pain, w prior lumbar facet RFA.   Pt developed a recurring cough and was admitted for COPD w pneumonia and sepsis (7/20-29/25). Her cough has persisted and within the last month she developed acute pain in her upper back which has worsened. CT chest (03/21/24) noted  compression fracture at T5 and follow up MR T-spine (04/09/24) confirming the acute T5 fracture and additional edema at T4 inferior endplate.   She denies any fall, trauma or injury, only noting the strain from her cough. She describes; a focused, high-back pain, sharp in nature, and radiates bilaterally to her R and L shoulder area. 8/10 at it's worst and only mildly relieved with sitting or laying down. She has been taking a mixture of tylenol  and prescribed opiate with some relief (3/10).  She reports a signigicant change in her daily life, limiting her mobility given the constant pain. ROS was negative for numbness, parasthesia, nor constipation with her pain medication.   Review of Systems: A 12-point ROS discussed, and pertinent positives are indicated in the HPI above.  All other systems are negative.  Past Medical History:  Diagnosis Date   Anginal pain    Arthritis    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, 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   CHF (congestive heart failure) (HCC)     followed colon resection, wouldn't let me get up   Chronic anticoagulation    Degenerative disc disease, lumbar    Diverticulitis 2018   Diverticulosis    DVT of lower extremity, bilateral (HCC) 2018   Essential hypertension    History of being hospitalized    1 month ago for 3 days, vomiting, and kink in upper intestine   History of hiatal hernia    Mediastinal adenopathy 04/2023   MRSA nasal colonization    Non-small cell lung cancer (HCC) 2022   right   Obesity (BMI 30-39.9)    Osteoarthritis    Osteoporosis    PE (pulmonary thromboembolism) (HCC) 2018   Pneumonia    Small bowel obstruction (HCC) 03/20/2023   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HCC)    Type 2 diabetes mellitus with obesity    Vertigo     Past Surgical History:  Procedure Laterality Date   APPENDECTOMY N/A 02/24/2017   Procedure: Incidental  APPENDECTOMY;  Surgeon: Shelva Dunnings, MD;  Location: [name];  Service: General;  Laterality: N/A;   CARPAL TUNNEL RELEASE Bilateral    CATARACT EXTRACTION W/PHACO Left 11/21/2018   Procedure: CATARACT EXTRACTION PHACO AND INTRAOCULAR LENS PLACEMENT (IOC) LEFT DIABETES;  Surgeon: Jaye Fallow, MD;  Location: [facility];  Service: Ophthalmology;  Laterality: Left;  latex sensitivity Diabetic - oral meds   CATARACT EXTRACTION W/PHACO Right 12/12/2018   Procedure: CATARACT EXTRACTION PHACO AND INTRAOCULAR LENS PLACEMENT (IOC) RIGHT DIABETES;  Surgeon: Jaye Fallow, MD;  Location: [facility];  Service: Ophthalmology;  Laterality: Right;  Diabetes-oral med Latex sensitiviy   COLON RESECTION SIGMOID N/A 11/02/2016   Procedure: COLON RESECTION SIGMOID;  Surgeon: Shelva Dunnings, MD;  Location: [name];  Service: General;  Laterality: N/A;   COLON SURGERY  11/02/2016   COLONOSCOPY WITH PROPOFOL  N/A 02/03/2017   Procedure: COLONOSCOPY WITH PROPOFOL ;  Surgeon: Unk Corinn Skiff, MD;  Location: [name];  Service: Gastroenterology;   Laterality: N/A;   COLOSTOMY N/A 11/02/2016   Procedure: COLOSTOMY;  Surgeon: Shelva Dunnings, MD;  Location: [name];  Service: General;  Laterality: N/A;   COLOSTOMY REVERSAL N/A 02/24/2017   Procedure: COLOSTOMY REVERSAL, ostomy takedown, spleenic flexure mobilization, excision rectal stump/distal sigmoid, anastomosis with suture reinforcement;  Surgeon: Shelva Dunnings, MD;  Location: [name];  Service: General;  Laterality: N/A;   ENDOBRONCHIAL ULTRASOUND N/A 05/16/2023   Procedure: ENDOBRONCHIAL ULTRASOUND;  Surgeon: Tamea Dedra CROME, MD;  Location: [name];  Service: Cardiopulmonary;  Laterality: N/A;   FLEXIBLE BRONCHOSCOPY N/A 05/16/2023   Procedure: FLEXIBLE BRONCHOSCOPY;  Surgeon: Tamea Dedra CROME, MD;  Location: [name];  Service: Cardiopulmonary;  Laterality: N/A;   ILEO LOOP DIVERSION N/A 02/24/2017   Procedure: ILEO LOOP COLOSTOMY;  Surgeon: Shelva Dunnings, MD;  Location: [name];  Service: General;  Laterality: N/A;   ILEOSTOMY CLOSURE N/A 06/01/2017   Procedure: LOOP ILEOSTOMY TAKEDOWN;  Surgeon: Shelva Dunnings, MD;  Location: [name];  Service: General;  Laterality: N/A;   IR IMAGING GUIDED PORT INSERTION  06/14/2023   IR RADIOLOGIST EVAL & MGMT  04/16/2024   IR REMOVAL TUN ACCESS W/ PORT W/O FL MOD SED  11/16/2023   IVC FILTER INSERTION Right 10/2016   IVC FILTER REMOVAL N/A 08/09/2017   Procedure: IVC FILTER REMOVAL;  Surgeon: Jama Cordella MATSU, MD;  Location: [name];  Service: Cardiovascular;  Laterality: N/A;   KNEE SURGERY Right    torn menicus   LAPAROSCOPY N/A 02/24/2017   Procedure: LAPAROSCOPY DIAGNOSTIC;  Surgeon: Shelva Dunnings, MD;  Location: [name];  Service: General;  Laterality: N/A;   LYSIS OF ADHESION N/A 02/24/2017   Procedure: LYSIS OF ADHESION;  Surgeon: Shelva Dunnings, MD;  Location: [name];  Service: General;  Laterality: N/A;   PULMONARY VENOGRAPHY N/A 11/19/2016   Procedure: Pulmonary Venography; IVC filter  placement; possible pulmonary thrombectomy;  Surgeon: Jama Cordella MATSU, MD;  Location: [name];  Service: Cardiovascular;  Laterality: N/A;   SACROPLASTY N/A 11/08/2017   Procedure: SACROPLASTY S2;  Surgeon: Kathlynn Sharper, MD;  Location: [name];  Service: Orthopedics;  Laterality: N/A;   SIGMOIDOSCOPY N/A 11/13/2016   Procedure: endoscopic  flexible SIGMOIDOSCOPY;  Surgeon: Wonda Charlie BRAVO, MD;  Location: [name];  Service: General;  Laterality: N/A;   SIGMOIDOSCOPY N/A 05/19/2017   Procedure: ENID MORIN;  Surgeon: Shelva Dunnings, MD;  Location: [name];  Service: General;  Laterality: N/A;   VIDEO BRONCHOSCOPY WITH ENDOBRONCHIAL NAVIGATION N/A 02/23/2021   Procedure: ROBOTIC VIDEO BRONCHOSCOPY WITH ENDOBRONCHIAL NAVIGATION;  Surgeon: Tamea Dedra CROME, MD;  Location: [name];  Service: Pulmonary;  Laterality: N/A;    Allergies: Adhesive [tape], Avapro  [irbesartan ], Morphine  and codeine , Other, and Latex  Medications: Prior to Admission medications   Medication Sig Start Date End Date Taking? Authorizing Provider  albuterol  (VENTOLIN  HFA) 108 (90 Base) MCG/ACT inhaler Inhale 2 puffs into the lungs every 6 (six) hours as needed for wheezing or shortness of breath. 01/10/24   Fernand Elfreda LABOR, MD  bisacodyl  (DULCOLAX) 10 MG suppository Place 1 suppository (10 mg  total) rectally as needed for moderate constipation. 07/06/23   Viviann Pastor, MD  budesonide -formoterol  (SYMBICORT ) 80-4.5 MCG/ACT inhaler Inhale 2 puffs into the lungs 2 (two) times daily. 04/03/24   Khan, Saadat A, MD  candesartan (ATACAND) 16 MG tablet Take 16 mg by mouth in the morning. 08/03/17   [provider]  cholecalciferol  (VITAMIN D ) 25 MCG (1000 UNIT) tablet Take 1,000 Units by mouth in the morning.    [provider]  Cyanocobalamin  (VITAMIN B-12) 5000 MCG TBDP Take 5,000 mcg by mouth in the morning.    [provider]  cyclobenzaprine  (FLEXERIL ) 10 MG tablet  Take 10 mg by mouth at bedtime. 07/05/16   [provider]  Dulaglutide (TRULICITY) 1.5 MG/0.5ML SOAJ Inject 1.5 mg into the skin once a week. Saturday 07/28/23 07/27/24  [provider]  gabapentin  (NEURONTIN ) 100 MG capsule Take 200 mg by mouth with breakfast, with lunch, and with evening meal.    [provider]  gabapentin  (NEURONTIN ) 300 MG capsule Take 300 mg by mouth at bedtime.    [provider]  glipiZIDE  (GLUCOTROL ) 10 MG tablet Take 10 mg by mouth daily before breakfast.    [provider]  HYDROcodone  bit-homatropine (HYCODAN) 5-1.5 MG/5ML syrup Take 5 mLs by mouth every 8 (eight) hours as needed for cough. 04/03/24   Melanee Annah BROCKS, MD  HYDROcodone -acetaminophen  (NORCO/VICODIN) 5-325 MG tablet Take 1 tablet by mouth every 8 (eight) hours as needed for up to 7 days for severe pain (pain score 7-10). Must last 7 days. 04/12/24 04/19/24  Tanya Glisson, MD  HYDROcodone -acetaminophen  (NORCO/VICODIN) 5-325 MG tablet Take 1 tablet by mouth every 8 (eight) hours as needed for up to 7 days for severe pain (pain score 7-10). Must last for 7 days. 04/19/24 04/26/24  Tanya Glisson, MD  LORazepam  (ATIVAN ) 0.5 MG tablet Take 1 tablet by mouth 30 minutes prior to MRI scan. May take additional tablet if needed. 04/04/24   Melanee Annah BROCKS, MD  Magnesium  Oxide -Mg Supplement 500 MG TABS Take 1 tablet by mouth at bedtime.    [provider]  mometasone -formoterol  (DULERA) 100-5 MCG/ACT AERO Inhale 2 puffs into the lungs 2 (two) times daily. 04/03/24   Fernand Elfreda LABOR, MD  omeprazole  (PRILOSEC) 20 MG capsule Take 1 capsule (20 mg total) by mouth daily. 08/30/23   Dasie Tinnie MATSU, NP  ondansetron  (ZOFRAN ) 8 MG tablet TAKE 1 TABLET BY MOUTH EVERY 8 HOURS AS NEEDED FOR NAUSEA OR VOMITING. START ON THE 3RD DAY AFTER CHEMOTHERAPY 08/15/23   Melanee Annah BROCKS, MD  [facility] ULTRA TEST test strip  01/25/23   [provider]  osimertinib  mesylate (TAGRISSO ) 40  MG tablet Take 1 tablet (40 mg total) by mouth daily. 02/17/24   Melanee Annah BROCKS, MD  oxybutynin  (DITROPAN ) 5 MG tablet Take 1 tablet by mouth 2 (two) times daily. 02/25/22   [provider]  oxyCODONE  (OXY IR/ROXICODONE ) 5 MG immediate release tablet Take 1 tablet (5 mg total) by mouth every 6 (six) hours as needed for severe pain (pain score 7-10). 04/02/24   Melanee Annah BROCKS, MD  OXYGEN  Inhale 2 L into the lungs.    [provider]  prochlorperazine  (COMPAZINE ) 10 MG tablet Take 1 tablet (10 mg total) by mouth every 6 (six) hours as needed for nausea or vomiting. 08/30/23   Dasie Tinnie MATSU, NP  rOPINIRole  (REQUIP ) 1 MG tablet Take 1 mg by mouth at bedtime. 06/22/23   [provider]  sucralfate  (  CARAFATE ) 1 g tablet Take 1 tablet (1 g total) by mouth 3 (three) times daily with meals. Dissolve tablet in 4 tablesppons warm water swish and swallow 30 min before meals. Patient taking differently: Take 1 g by mouth 3 (three) times daily with meals. Dissolve tablet in 4 tablespoons warm water swish and swallow 30 min before meals. 07/22/23 04/12/24  Lenn Aran, MD  warfarin (COUMADIN ) 5 MG tablet Take 5 mg by mouth daily. 06/01/23   [provider]     Family History  Problem Relation Age of Onset   Diabetes Mother    Heart disease Mother    Cancer Mother    Breast cancer Mother        >50   Heart disease Father    Diabetes Sister    Heart disease Sister     Social History   Socioeconomic History   Marital status: Married    Spouse name: Butler   Number of children: 1   Years of education: Not on file   Highest education level: Not on file  Occupational History   Not on file  Tobacco Use   Smoking status: Never    Passive exposure: Current (husband smokes)   Smokeless tobacco: Never  Vaping Use   Vaping status: Never Used  Substance and Sexual Activity   Alcohol  use: No    Alcohol /week: 0.0 standard drinks of alcohol    Drug use: No   Sexual  activity: Never  Other Topics Concern   Not on file  Social History Narrative   Not on file   Social Drivers of Health   Financial Resource Strain: Low Risk  (05/19/2023)   Received from [facility]   Overall Financial Resource Strain (CARDIA)    Difficulty of Paying Living Expenses: Not hard at all  Food Insecurity: No Food Insecurity (12/12/2023)   Hunger Vital Sign    Worried About Running Out of Food in the Last Year: Never true    Ran Out of Food in the Last Year: Never true  Transportation Needs: No Transportation Needs (12/12/2023)   PRAPARE - Administrator, Civil Service (Medical): No    Lack of Transportation (Non-Medical): No  Physical Activity: Not on file  Stress: Not on file  Social Connections: Moderately Isolated (12/12/2023)   Social Connection and Isolation Panel    Frequency of Communication with Friends and Family: More than three times a week    Frequency of Social Gatherings with Friends and Family: Once a week    Attends Religious Services: Never    Database Administrator or Organizations: No    Attends Engineer, Structural: Never    Marital Status: Married    ECOG Status: 2 - Symptomatic, <50% confined to bed  Review of Systems As above  Vital Signs: BP 113/60   SpO2 95%   Physical Exam Musculoskeletal:       Back:     Comments: High back point tenderness      General: WN, elderly female in mild discomfort  CV: RRR on monitor Pulm: Cough, mildly strained breathing on RA Abd: S, ND, NT MSK: Grossly normal Psych: Appropriate affect.   Imaging:  Imaging independently reviewed, demonstrating T4 and T5 adjacent endplate vertebral body fractures    IR Radiologist Eval & Mgmt Result Date: 04/16/2024 EXAM: NEW PATIENT VISIT CHIEF COMPLAINT: See below HISTORY OF PRESENT ILLNESS: See below REVIEW OF SYSTEMS: See below PHYSICAL EXAMINATION: See below ASSESSMENT AND  PLAN: Please refer to completed note in  the electronic medical record on [place] Epic Thom Hall, MD [facility] Electronically Signed   By: Thom Hall M.D.   On: 04/16/2024 15:04   DG PAIN CLINIC C-ARM 1-60 MIN NO REPORT Result Date: 04/12/2024 Fluoro was used, but no Radiologist interpretation will be provided. Please refer to NOTES tab for provider progress note.  MR Thoracic Spine W Wo Contrast Result Date: 04/06/2024 CLINICAL DATA:  Initial evaluation for compression fracture, back pain. EXAM: MRI THORACIC WITHOUT AND WITH CONTRAST TECHNIQUE: Multiplanar and multiecho pulse sequences of the thoracic spine were obtained without and with intravenous contrast. CONTRAST:  7.5mL GADAVIST  GADOBUTROL  1 MMOL/ML IV SOLN COMPARISON:  Comparison made with prior CT from 03/21/2024. FINDINGS: Alignment: Mild levoscoliosis with exaggeration of the normal thoracic kyphosis. No listhesis. Vertebrae: There is an acute/recent compression fracture involving the superior endplate of T5. Fracture line traverses the T4-5 interspace with extension through the posterior cortex. Associated height loss measures up to 40% with trace 3 mm retropulsion. No significant stenosis. No visible underlying pathologic lesion by MRI. Prominent endplate Schmorl's node deformity with reactive marrow edema present at the adjacent inferior endplate of T4. This appears somewhat progressed as compared to prior CT. Associated mild height loss without retropulsion. Otherwise, vertebral body height maintained with no other acute or chronic fracture. Bone marrow signal intensity overall within normal limits. Few scattered benign hemangiomata noted. No worrisome osseous lesions. No other abnormal marrow edema or enhancement. Cord:  Normal signal morphology. No abnormal enhancement. Paraspinal and other soft tissues: Paraspinous soft tissues demonstrate no acute finding. Bandlike consolidative opacity within the partially  visualized right upper and lower lobes. Associated pleuroparenchymal thickening about the visualized right lung with small layering right pleural effusion. Findings are similar to prior CT with these changes are better evaluated. Disc levels: T1-2:  Unremarkable. T2-3: Normal interspace. Mild right-sided facet spurring. No canal or foraminal stenosis. T3-4:  Minimal disc bulge.  No canal or foraminal stenosis. T4-5:  Mild disc bulge.  Minimal facet hypertrophy.  No stenosis. T5-6: Mild disc bulge. 3 mm bony retropulsion related to the T5 compression fracture indents the central to right paracentral thecal sac (series 21, image 18). Mild cord flattening without cord signal changes or significant spinal stenosis. Foramina remain patent. T6-7: Left paracentral disc protrusion indents the left ventral thecal sac (series 21, image 20). Mild cord flattening without cord signal changes or significant spinal stenosis. Foramina remain patent. T7-8: Tiny right paracentral disc protrusion indents the right ventral thecal sac without stenosis (series 21, image 24). T8-9: Chronic endplate Schmorl's node deformity without significant disc bulge or stenosis. T9-10: Right paracentral disc extrusion with superior migration without significant stenosis (series 21, image 29). T10-11: Disc desiccation with mild disc bulge and endplate spurring. Chronic endplate Schmorl's node deformity. Mild facet hypertrophy. No stenosis. T11-12:  Minimal disc bulge.  No stenosis. T12-L1: Seen only on sagittal projection. Mild diffuse disc bulge with bilateral facet hypertrophy. No significant stenosis. IMPRESSION: 1. Acute/recent compression fracture involving the superior endplate of T5 with up to 40% height loss and trace 3 mm bony retropulsion. No significant stenosis. 2. Prominent endplate Schmorl's node deformity with reactive marrow edema at the adjacent inferior endplate of T4. This appears somewhat progressed as compared to prior CT, and  could also contribute to back pain. 3. Mild multilevel degenerative spondylosis with a few scattered small disc protrusions as detailed above. No significant stenosis. 4. Bandlike  consolidative opacity within the partially visualized right upper and lower lobes with associated pleuroparenchymal thickening and small layering right pleural effusion. Findings are similar to prior chest CT with these changes are better evaluated. Electronically Signed   By: Morene Hoard M.D.   On: 04/06/2024 19:20   CT CHEST W CONTRAST Result Date: 03/21/2024 EXAM: CT CHEST WITH CONTRAST 03/21/2024 10:11:40 AM TECHNIQUE: CT of the chest was performed with the administration of 80 mL of iohexol  (OMNIPAQUE ) 300 MG/ML solution. Multiplanar reformatted images are provided for review. Automated exposure control, iterative reconstruction, and/or weight based adjustment of the mA/kV was utilized to reduce the radiation dose to as low as reasonably achievable. COMPARISON: 12/11/2023 CLINICAL HISTORY: Malignant neoplasm of lung (HCC) and bronchitis. FINDINGS: MEDIASTINUM: Myocardiomegaly. Thoracic aortic, coronary artery, and branch vessel atheromatous vascular calcifications. No acute pulmonary embolism is identified. 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 Pericardium is unremarkable. The central airways are clear. LYMPH NODES: No mediastinal, hilar or axillary lymphadenopathy. LUNGS AND PLEURA: Bandlike consolidation in the right upper lobe and superior segment right lower lobe with some associated volume loss and associated airway plugging in the superior segment right lower lobe. The consolidation appears reduced in volume but more confluent compared to 12/11/2023. This may well be the result of prior radiation therapy. There are some patchy ground glass opacities in the right upper lobe suggesting alveolitis. A well-defined mass separate from the  band of consolidation is not well seen. Mild peripheral bandlike and ground glass opacities in the left upper lobe and left lower lobe, improved from previous. 3 mm left upper lobe nodule shown on image 57 series 4, stable. Trace right pleural effusion with right pleural thickening and some pleural enhancement and mild pleural nodularity along the diaphragm posteriorly on the right. Pleural metastatic disease not excluded. Prior left pleural effusion has resolved. No pneumothorax. No pulmonary edema. SOFT TISSUES/BONES: Thoracic spondylosis. New 40 percent superior endplate compression fracture at the T5 vertebral level. No acute abnormality of the soft tissues. UPPER ABDOMEN: Limited images of the upper abdomen demonstrate cholelithiasis. IMPRESSION: 1. No acute pulmonary embolism. 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 2. Bandlike consolidation in the right upper lobe and superior segment right lower lobe with associated volume loss and airway plugging, possibly related to prior radiation therapy. Patchy ground glass opacities in the right upper lobe suggest alveolitis. No well-defined separate mass is identified. 3. Trace right pleural effusion with right pleural thickening and enhancement, with mild right diaphragmatic pleural nodularity; pleural metastatic disease is not excluded although the the region was not significantly hypermetabolic on 01/13/2024 PET-CT. 4. Mild peripheral bandlike and ground glass opacities in the left upper and left lower lobes, improved from prior study. 5. Stable 3 mm left upper lobe pulmonary nodule; no routine follow-up imaging is recommended per Fleischner Society Guidelines. 6. New approximately 40% superior endplate compression fracture at T5. 7. Thoracic spondylosis. Electronically signed by: Ryan Salvage MD 03/21/2024 10:43 AM EDT RP Workstation: [id number]    Labs:  CBC: Recent Labs    12/20/23 0541 01/25/24 1459  01/30/24 1455 03/09/24 0924  WBC 5.9 3.6* 4.2 5.2  HGB 10.1* 11.3* 11.5* 11.8*  HCT 31.5* 35.6* 36.0 35.8*  PLT 127* 78* 94* 162    COAGS: Recent Labs    08/02/23 1108 08/02/23 1440 12/17/23 0306 12/18/23 0401 12/19/23 0137 12/20/23 0541  INR 9.8*   < >  2.2* 2.4* 2.4* 3.0*  APTT 49*  --   --   --   --   --    < > = values in this interval not displayed.    BMP: Recent Labs    12/20/23 0541 01/25/24 1459 01/30/24 1455 03/09/24 0924  NA 138 136 135 137  K 4.0 3.6 3.8 4.4  CL 102 102 102 104  CO2 28 26 24 25   GLUCOSE 231* 135* 174* 243*  BUN 22 9 9 14   CALCIUM  8.3* 9.0 8.8* 9.0  CREATININE 0.84 0.85 0.86 0.82  GFRNONAA >60 >60 >60 >60      Cancer Staging  No matching staging information was found for the patient.   Assessment and Plan:  73 y/o F comorbid w PMHx of COPD w treated superimposed PNA, and metastatic RUL CA. Atraumatic adjacent T4 and T5 vertebral body fractures, confirmed on MR T-spine (04/06/24), question pathologic given her malignant Hx. Ongoing, life-style limiting pain secondary to the fractures, refractory to conservative management.   Pain on exam concordant with level of fracture, Failure of conservative therapy and pain refractory to pain mediation, and significant disability on the [employer] Disability Questionnaire with 11/24 positive symptoms, reflecting moderate impact/impairment of (ADLs)   ICD-10-CM Codes that Support Medical Necessity ([email or web address].at.[email or web address]?articleId=[email or web address]) S22.049A  Fracture of T4 thoracic vertebra, initial encounter for a closed fracture.  S22.059A  Fracture of T5 thoracic vertebra, initial encounter for closed fracture M84.48XA  Question pathological fracture, initial encounter for fracture    She is interested in pursuing a minimally-invasive option for the treatment for her thoracic fractures at this time, and is curious about T4 and T5 VERTEBRAL AUGMENTATION with  KYPHOPLASTY, along with concurrent BONE BIOPSY.    The procedure has been fully reviewed with the patient/patient's authorized representative. The risks, benefits and alternatives have been explained, and the patient/patient's authorized representative has consented to the procedure.   *CT Chest and MR T spine (04/06/24) reviewed. No additional imaging required. *Pt based in [place] and with limited mobility. To be performed at [facility].  *Proceed to schedule based on mutual availability. Soonest available per Pt request given significant discomfort.   *[employer] KP representative support requested. *Same day procedure, no overnight admission. *Ancef  for pre op Abx. Prior Hx DVT / PE, AC hold if on warfarin, 5 days. *Bone density testing w DEXA, Vit D and Ca2+ supplementation per PCP   Per CMS PQRS reporting requirements (PQRS Measure 24): Given the patient's age of greater than 50 and the fracture site (hip, distal radius, or spine), the patient should be tested for osteoporosis using DXA, and the appropriate treatment considered based on the DXA results.  Thank you for this interesting consult.  I greatly enjoyed meeting [employer] and look forward to participating in their care.  A copy of this report was sent to the requesting provider on this date.  Electronically Signed:  Thom Hall, MD [facility]   Pager. [phone] Clinic. [phone]  I spent a total of 45 Minutes of face-to-face time in clinical consultation, greater than 50% of which was counseling/coordinating care for [name]'s evaluation for symptomatic, Q pathologic T4 and T5 vertebral body fractures.

## 2024-04-17 ENCOUNTER — Other Ambulatory Visit: Payer: Self-pay | Admitting: Pharmacist

## 2024-04-17 DIAGNOSIS — Z7901 Long term (current) use of anticoagulants: Secondary | ICD-10-CM | POA: Diagnosis not present

## 2024-04-17 DIAGNOSIS — C3411 Malignant neoplasm of upper lobe, right bronchus or lung: Secondary | ICD-10-CM

## 2024-04-17 DIAGNOSIS — Z86711 Personal history of pulmonary embolism: Secondary | ICD-10-CM | POA: Diagnosis not present

## 2024-04-17 MED ORDER — OSIMERTINIB MESYLATE 40 MG PO TABS: 40.0000 mg | ORAL_TABLET | Freq: Every day | ORAL | 3 refills | Status: DC

## 2024-04-17 NOTE — Telephone Encounter (Signed)
 PAP program requested refill

## 2024-04-24 ENCOUNTER — Other Ambulatory Visit: Payer: Self-pay | Admitting: Interventional Radiology

## 2024-04-24 DIAGNOSIS — S22040A Wedge compression fracture of fourth thoracic vertebra, initial encounter for closed fracture: Secondary | ICD-10-CM

## 2024-04-24 DIAGNOSIS — S22050A Wedge compression fracture of T5-T6 vertebra, initial encounter for closed fracture: Secondary | ICD-10-CM

## 2024-04-25 ENCOUNTER — Ambulatory Visit: Admitting: Oncology

## 2024-04-25 ENCOUNTER — Other Ambulatory Visit

## 2024-04-25 ENCOUNTER — Telehealth: Payer: Self-pay | Admitting: Internal Medicine

## 2024-04-25 ENCOUNTER — Other Ambulatory Visit: Payer: Self-pay

## 2024-04-25 DIAGNOSIS — C3411 Malignant neoplasm of upper lobe, right bronchus or lung: Secondary | ICD-10-CM

## 2024-04-25 MED ORDER — OXYCODONE HCL 5 MG PO TABS: 5.0000 mg | ORAL_TABLET | Freq: Four times a day (QID) | ORAL | 0 refills | Status: DC | PRN

## 2024-04-25 NOTE — Telephone Encounter (Signed)
 Patient to come in 12/8 for chest x-ray results. Test not done. Left patient vm to return my call-Toni

## 2024-04-28 DIAGNOSIS — I509 Heart failure, unspecified: Secondary | ICD-10-CM | POA: Diagnosis not present

## 2024-04-28 DIAGNOSIS — J9611 Chronic respiratory failure with hypoxia: Secondary | ICD-10-CM | POA: Diagnosis not present

## 2024-04-30 ENCOUNTER — Ambulatory Visit: Admitting: Internal Medicine

## 2024-05-01 ENCOUNTER — Ambulatory Visit: Admitting: Internal Medicine

## 2024-05-02 ENCOUNTER — Other Ambulatory Visit: Payer: Self-pay | Admitting: Radiology

## 2024-05-02 DIAGNOSIS — S22050A Wedge compression fracture of T5-T6 vertebra, initial encounter for closed fracture: Secondary | ICD-10-CM

## 2024-05-03 ENCOUNTER — Telehealth: Payer: Self-pay

## 2024-05-03 DIAGNOSIS — C3411 Malignant neoplasm of upper lobe, right bronchus or lung: Secondary | ICD-10-CM

## 2024-05-03 NOTE — Progress Notes (Signed)
 Complex Care Management Note  Care Guide Note 05/03/2024 Name: Kara [name] MRN: [id number] DOB: 1950/06/02  Kara [name] is a 74 y.o. year old female who sees Sadie Manna, MD for primary care. I reached out to Kara [name] by phone today to offer complex care management services.  Kara [name] was given information about Complex Care Management services today including:   The Complex Care Management services include support from the care team which includes your Nurse Care Manager, Clinical Social Worker, or Pharmacist.  The Complex Care Management team is here to help remove barriers to the health concerns and goals most important to you. Complex Care Management services are voluntary, and the patient may decline or stop services at any time by request to their care team member.   Complex Care Management Consent Status: Patient did not agree to participate in complex care management services at this time.  Encounter Outcome:  Patient Refused  [employer] Agent [facility], [facility] VBCI Assistant Direct Dial: [phone]  Fax: [phone]

## 2024-05-03 NOTE — H&P (Signed)
 Chief Complaint: Patient was seen in consultation today for symptomatic T4/T5 vertebral compression fractures refractory to conservative management, with consideration for bone biopsy and kyphoplasty.  Referring Provider(s): Dr. Annah Skene, MD   Supervising Physician: Hughes Simmonds  Patient Status: [facility] - Out-pt  Patient is Full Code  History of Present Illness: Kara [name] is a 73 y.o. female  with PMHx notable for traumatic compression fractures of T4 and T5. PMHx otherwise as noted below.  Per Dr. Thelda Progress Note date 11/24: 73 y/o F comorbid w PMHx of COPD w treated superimposed PNA, and metastatic RUL CA. Atraumatic adjacent T4 and T5 vertebral body fractures, confirmed on MR T-spine (04/06/24), question pathologic given her malignant Hx. Ongoing, life-style limiting pain secondary to the fractures, refractory to conservative management.   Pain on exam concordant with level of fracture, Failure of conservative therapy and pain refractory to pain mediation, and significant disability on the [employer] Disability Questionnaire with 11/24 positive symptoms, reflecting moderate impact/impairment of (ADLs)   [...] She is interested in pursuing a minimally-invasive option for the treatment for her thoracic fractures at this time, and is curious about T4 and T5 VERTEBRAL AUGMENTATION with KYPHOPLASTY, along with concurrent BONE BIOPSY.    The procedure has been fully reviewed with the patient/patients authorized representative. The risks, benefits and alternatives have been explained, and the patient/patients authorized representative has consented to the procedure.   Interventional Radiology was requested for T4/T5 kyphoplasty and concurrent bone biopsy. Request was reviewed and approved by Dr. Hughes. Patient is scheduled for same in IR today.   Patient is alert and laying in bed, calm. Friend at bedside. Patient is currently without any significant  complaints. She does endorse pain in the middle of her back, and a cough. Patient denies any fevers, headache, chest pain, SOB, abdominal pain, nausea, vomiting or bleeding.     Past Medical History:  Diagnosis Date   Anginal pain    Arthritis    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, 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   CHF (congestive heart failure) (HCC)    followed colon resection, wouldn't let me get up   Chronic anticoagulation    Degenerative disc disease, lumbar    Diverticulitis 2018   Diverticulosis    DVT of lower extremity, bilateral (HCC) 2018   Essential hypertension    History of being hospitalized    1 month ago for 3 days, vomiting, and kink in upper intestine   History of hiatal hernia    Mediastinal adenopathy 04/2023   MRSA nasal colonization    Non-small cell lung cancer (HCC) 2022   right   Obesity (BMI 30-39.9)    Osteoarthritis    Osteoporosis    PE (pulmonary thromboembolism) (HCC) 2018   Pneumonia    Small bowel obstruction (HCC) 03/20/2023   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HCC)    Type 2 diabetes mellitus with obesity    Vertigo     Past Surgical History:  Procedure Laterality Date   APPENDECTOMY N/A 02/24/2017   Procedure: Incidental  APPENDECTOMY;  Surgeon: Shelva Dunnings, MD;  Location: [name];  Service: General;  Laterality: N/A;   CARPAL TUNNEL RELEASE Bilateral    CATARACT EXTRACTION W/PHACO Left 11/21/2018   Procedure: CATARACT EXTRACTION PHACO AND INTRAOCULAR LENS PLACEMENT (IOC) LEFT DIABETES;  Surgeon: Jaye Fallow, MD;  Location: [facility];  Service: Ophthalmology;  Laterality: Left;  latex sensitivity Diabetic - oral meds   CATARACT EXTRACTION W/PHACO Right 12/12/2018  Procedure: CATARACT EXTRACTION PHACO AND INTRAOCULAR LENS PLACEMENT (IOC) RIGHT DIABETES;  Surgeon: Jaye Fallow, MD;  Location: [facility];  Service: Ophthalmology;  Laterality: Right;  Diabetes-oral med Latex sensitiviy   COLON RESECTION  SIGMOID N/A 11/02/2016   Procedure: COLON RESECTION SIGMOID;  Surgeon: Shelva Dunnings, MD;  Location: [name];  Service: General;  Laterality: N/A;   COLON SURGERY  11/02/2016   COLONOSCOPY WITH PROPOFOL  N/A 02/03/2017   Procedure: COLONOSCOPY WITH PROPOFOL ;  Surgeon: Unk Corinn Skiff, MD;  Location: [name];  Service: Gastroenterology;  Laterality: N/A;   COLOSTOMY N/A 11/02/2016   Procedure: COLOSTOMY;  Surgeon: Shelva Dunnings, MD;  Location: [name];  Service: General;  Laterality: N/A;   COLOSTOMY REVERSAL N/A 02/24/2017   Procedure: COLOSTOMY REVERSAL, ostomy takedown, spleenic flexure mobilization, excision rectal stump/distal sigmoid, anastomosis with suture reinforcement;  Surgeon: Shelva Dunnings, MD;  Location: [name];  Service: General;  Laterality: N/A;   ENDOBRONCHIAL ULTRASOUND N/A 05/16/2023   Procedure: ENDOBRONCHIAL ULTRASOUND;  Surgeon: Tamea Dedra CROME, MD;  Location: [name];  Service: Cardiopulmonary;  Laterality: N/A;   FLEXIBLE BRONCHOSCOPY N/A 05/16/2023   Procedure: FLEXIBLE BRONCHOSCOPY;  Surgeon: Tamea Dedra CROME, MD;  Location: [name];  Service: Cardiopulmonary;  Laterality: N/A;   ILEO LOOP DIVERSION N/A 02/24/2017   Procedure: ILEO LOOP COLOSTOMY;  Surgeon: Shelva Dunnings, MD;  Location: [name];  Service: General;  Laterality: N/A;   ILEOSTOMY CLOSURE N/A 06/01/2017   Procedure: LOOP ILEOSTOMY TAKEDOWN;  Surgeon: Shelva Dunnings, MD;  Location: [name];  Service: General;  Laterality: N/A;   IR IMAGING GUIDED PORT INSERTION  06/14/2023   IR RADIOLOGIST EVAL & MGMT  04/16/2024   IR REMOVAL TUN ACCESS W/ PORT W/O FL MOD SED  11/16/2023   IVC FILTER INSERTION Right 10/2016   IVC FILTER REMOVAL N/A 08/09/2017   Procedure: IVC FILTER REMOVAL;  Surgeon: Jama Cordella MATSU, MD;  Location: [name];  Service: Cardiovascular;  Laterality: N/A;   KNEE SURGERY Right    torn menicus   LAPAROSCOPY N/A 02/24/2017   Procedure:  LAPAROSCOPY DIAGNOSTIC;  Surgeon: Shelva Dunnings, MD;  Location: [name];  Service: General;  Laterality: N/A;   LYSIS OF ADHESION N/A 02/24/2017   Procedure: LYSIS OF ADHESION;  Surgeon: Shelva Dunnings, MD;  Location: [name];  Service: General;  Laterality: N/A;   PULMONARY VENOGRAPHY N/A 11/19/2016   Procedure: Pulmonary Venography; IVC filter placement; possible pulmonary thrombectomy;  Surgeon: Jama Cordella MATSU, MD;  Location: [name];  Service: Cardiovascular;  Laterality: N/A;   SACROPLASTY N/A 11/08/2017   Procedure: SACROPLASTY S2;  Surgeon: Kathlynn Sharper, MD;  Location: [name];  Service: Orthopedics;  Laterality: N/A;   SIGMOIDOSCOPY N/A 11/13/2016   Procedure: endoscopic  flexible SIGMOIDOSCOPY;  Surgeon: Wonda Charlie BRAVO, MD;  Location: [name];  Service: General;  Laterality: N/A;   SIGMOIDOSCOPY N/A 05/19/2017   Procedure: ENID MORIN;  Surgeon: Shelva Dunnings, MD;  Location: [name];  Service: General;  Laterality: N/A;   VIDEO BRONCHOSCOPY WITH ENDOBRONCHIAL NAVIGATION N/A 02/23/2021   Procedure: ROBOTIC VIDEO BRONCHOSCOPY WITH ENDOBRONCHIAL NAVIGATION;  Surgeon: Tamea Dedra CROME, MD;  Location: [name];  Service: Pulmonary;  Laterality: N/A;    Allergies: Adhesive [tape], Avapro  [irbesartan ], Morphine  and codeine , Other, and Latex  Medications: Prior to Admission medications  Medication Sig Start Date End Date Taking? Authorizing Provider  albuterol  (VENTOLIN  HFA) 108 (90 Base) MCG/ACT inhaler Inhale 2 puffs into the lungs every 6 (six) hours as needed  for wheezing or shortness of breath. 01/10/24   Fernand Elfreda LABOR, MD  bisacodyl  (DULCOLAX) 10 MG suppository Place 1 suppository (10 mg total) rectally as needed for moderate constipation. 07/06/23   Viviann Pastor, MD  budesonide -formoterol  (SYMBICORT ) 80-4.5 MCG/ACT inhaler Inhale 2 puffs into the lungs 2 (two) times daily. 04/03/24   Khan, Saadat A, MD  candesartan (ATACAND) 16 MG  tablet Take 16 mg by mouth in the morning. 08/03/17   [provider]  cholecalciferol  (VITAMIN D ) 25 MCG (1000 UNIT) tablet Take 1,000 Units by mouth in the morning.    [provider]  Cyanocobalamin  (VITAMIN B-12) 5000 MCG TBDP Take 5,000 mcg by mouth in the morning.    [provider]  cyclobenzaprine  (FLEXERIL ) 10 MG tablet Take 10 mg by mouth at bedtime. 07/05/16   [provider]  Dulaglutide (TRULICITY) 1.5 MG/0.5ML SOAJ Inject 1.5 mg into the skin once a week. Saturday 07/28/23 07/27/24  [provider]  gabapentin  (NEURONTIN ) 100 MG capsule Take 200 mg by mouth with breakfast, with lunch, and with evening meal.    [provider]  gabapentin  (NEURONTIN ) 300 MG capsule Take 300 mg by mouth at bedtime.    [provider]  glipiZIDE  (GLUCOTROL ) 10 MG tablet Take 10 mg by mouth daily before breakfast.    [provider]  HYDROcodone  bit-homatropine (HYCODAN) 5-1.5 MG/5ML syrup Take 5 mLs by mouth every 8 (eight) hours as needed for cough. 04/03/24   Melanee Annah BROCKS, MD  LORazepam  (ATIVAN ) 0.5 MG tablet Take 1 tablet by mouth 30 minutes prior to MRI scan. May take additional tablet if needed. 04/04/24   Melanee Annah BROCKS, MD  Magnesium  Oxide -Mg Supplement 500 MG TABS Take 1 tablet by mouth at bedtime.    [provider]  mometasone -formoterol  (DULERA) 100-5 MCG/ACT AERO Inhale 2 puffs into the lungs 2 (two) times daily. 04/03/24   Fernand Elfreda LABOR, MD  omeprazole  (PRILOSEC) 20 MG capsule Take 1 capsule (20 mg total) by mouth daily. 08/30/23   Dasie Tinnie MATSU, NP  ondansetron  (ZOFRAN ) 8 MG tablet TAKE 1 TABLET BY MOUTH EVERY 8 HOURS AS NEEDED FOR NAUSEA OR VOMITING. START ON THE 3RD DAY AFTER CHEMOTHERAPY 08/15/23   Melanee Annah BROCKS, MD  [facility] ULTRA TEST test strip  01/25/23   [provider]  osimertinib  mesylate (TAGRISSO ) 40 MG tablet Take 1 tablet (40 mg total) by mouth daily. 04/17/24   Melanee Annah BROCKS, MD  oxybutynin   (DITROPAN ) 5 MG tablet Take 1 tablet by mouth 2 (two) times daily. 02/25/22   [provider]  oxyCODONE  (OXY IR/ROXICODONE ) 5 MG immediate release tablet Take 1 tablet (5 mg total) by mouth every 6 (six) hours as needed for severe pain (pain score 7-10). 04/25/24   Melanee Annah BROCKS, MD  OXYGEN  Inhale 2 L into the lungs.    [provider]  prochlorperazine  (COMPAZINE ) 10 MG tablet Take 1 tablet (10 mg total) by mouth every 6 (six) hours as needed for nausea or vomiting. 08/30/23   Dasie Tinnie MATSU, NP  rOPINIRole  (REQUIP ) 1 MG tablet Take 1 mg by mouth at bedtime. 06/22/23   [provider]  sucralfate  (CARAFATE ) 1 g tablet Take 1 tablet (1 g total) by mouth 3 (three) times daily with meals. Dissolve tablet in 4 tablesppons warm water swish and swallow 30 min before meals. Patient taking differently: Take 1 g by mouth 3 (three) times daily with meals. Dissolve tablet in 4 tablespoons warm water  swish and swallow 30 min before meals. 07/22/23 04/12/24  Lenn Aran, MD  warfarin (COUMADIN ) 5 MG tablet Take 5 mg by mouth daily. 06/01/23   [provider]     Family History  Problem Relation Age of Onset   Diabetes Mother    Heart disease Mother    Cancer Mother    Breast cancer Mother        >50   Heart disease Father    Diabetes Sister    Heart disease Sister     Social History   Socioeconomic History   Marital status: Married    Spouse name: Butler   Number of children: 1   Years of education: Not on file   Highest education level: Not on file  Occupational History   Not on file  Tobacco Use   Smoking status: Never    Passive exposure: Current (husband smokes)   Smokeless tobacco: Never  Vaping Use   Vaping status: Never Used  Substance and Sexual Activity   Alcohol  use: No    Alcohol /week: 0.0 standard drinks of alcohol    Drug use: No   Sexual activity: Never  Other Topics Concern   Not on file  Social History Narrative   Not on file    Social Drivers of Health   Tobacco Use: Medium Risk (04/12/2024)   Patient History    Smoking Tobacco Use: Never    Smokeless Tobacco Use: Never    Passive Exposure: Current  Financial Resource Strain: Low Risk  (05/19/2023)   Received from [facility]   Overall Financial Resource Strain (CARDIA)    Difficulty of Paying Living Expenses: Not hard at all  Food Insecurity: No Food Insecurity (12/12/2023)   Epic    Worried About Radiation Protection Practitioner of Food in the Last Year: Never true    Ran Out of Food in the Last Year: Never true  Transportation Needs: No Transportation Needs (12/12/2023)   Epic    Lack of Transportation (Medical): No    Lack of Transportation (Non-Medical): No  Physical Activity: Not on file  Stress: Not on file  Social Connections: Moderately Isolated (12/12/2023)   Social Connection and Isolation Panel    Frequency of Communication with Friends and Family: More than three times a week    Frequency of Social Gatherings with Friends and Family: Once a week    Attends Religious Services: Never    Database Administrator or Organizations: No    Attends Banker Meetings: Never    Marital Status: Married  Depression (PHQ2-9): Low Risk (04/12/2024)   Depression (PHQ2-9)    PHQ-2 Score: 2  Alcohol  Screen: Not on file  Housing: Low Risk  (04/17/2024)   Received from [facility]   Epic    In the last 12 months, was there a time when you were not able to pay the mortgage or rent on time?: No    In the past 12 months, how many times have you moved where you were living?: 0    At any time in the past 12 months, were you homeless or living in a shelter (including now)?: No  Utilities: Not At Risk (12/12/2023)   Epic    Threatened with loss of utilities: No  Health Literacy: Not on file     Review of Systems: A 12 point ROS discussed and pertinent positives are indicated in the HPI above.  All other systems are  negative.  Vital Signs:  BP (!) 98/54   Pulse 90   Temp 98.2 F (36.8 C) (Oral)   Resp 18   Ht 5' 3 (1.6 m)   Wt 145 lb (65.8 kg)   SpO2 96%   BMI 25.69 kg/m   Advance Care Plan: The advanced care place/surrogate decision maker was discussed at the time of visit and the patient did not wish to discuss or was not able to name a surrogate decision maker or provide an advance care plan.  Physical Exam Constitutional:      General: She is not in acute distress.    Appearance: Normal appearance.  HENT:     Mouth/Throat:     Mouth: Mucous membranes are dry.  Cardiovascular:     Rate and Rhythm: Normal rate and regular rhythm.     Pulses: Normal pulses.     Heart sounds: Normal heart sounds.  Pulmonary:     Effort: Pulmonary effort is normal.     Breath sounds: Normal breath sounds.  Abdominal:     General: Abdomen is flat.  Musculoskeletal:        General: Normal range of motion.     Cervical back: Normal range of motion.     Comments: Point tenderness to T4/ T5 area.  Skin:    General: Skin is warm and dry.  Neurological:     Mental Status: She is alert and oriented to person, place, and time.  Psychiatric:        Mood and Affect: Mood normal.        Behavior: Behavior normal.        Thought Content: Thought content normal.        Judgment: Judgment normal.     Imaging: IR Radiologist Eval & Mgmt Result Date: 04/16/2024 EXAM: NEW PATIENT VISIT CHIEF COMPLAINT: See below HISTORY OF PRESENT ILLNESS: See below REVIEW OF SYSTEMS: See below PHYSICAL EXAMINATION: See below ASSESSMENT AND PLAN: Please refer to completed note in the electronic medical record on [facility] Thom Hall, MD [facility] Electronically Signed   By: Thom Hall M.D.   On: 04/16/2024 15:04   DG PAIN CLINIC C-ARM 1-60 MIN NO REPORT Result Date: 04/12/2024 Fluoro was used, but no Radiologist interpretation will be provided. Please  refer to NOTES tab for provider progress note.  MR Thoracic Spine W Wo Contrast Result Date: 04/06/2024 CLINICAL DATA:  Initial evaluation for compression fracture, back pain. EXAM: MRI THORACIC WITHOUT AND WITH CONTRAST TECHNIQUE: Multiplanar and multiecho pulse sequences of the thoracic spine were obtained without and with intravenous contrast. CONTRAST:  7.5mL GADAVIST  GADOBUTROL  1 MMOL/ML IV SOLN COMPARISON:  Comparison made with prior CT from 03/21/2024. FINDINGS: Alignment: Mild levoscoliosis with exaggeration of the normal thoracic kyphosis. No listhesis. Vertebrae: There is an acute/recent compression fracture involving the superior endplate of T5. Fracture line traverses the T4-5 interspace with extension through the posterior cortex. Associated height loss measures up to 40% with trace 3 mm retropulsion. No significant stenosis. No visible underlying pathologic lesion by MRI. Prominent endplate Schmorl's node deformity with reactive marrow edema present at the adjacent inferior endplate of T4. This appears somewhat progressed as compared to prior CT. Associated mild height loss without retropulsion. Otherwise, vertebral body height maintained with no other acute or chronic fracture. Bone marrow signal intensity overall within normal limits. Few scattered benign hemangiomata noted. No worrisome osseous lesions. No other abnormal marrow edema or enhancement. Cord:  Normal signal morphology. No abnormal enhancement.  Paraspinal and other soft tissues: Paraspinous soft tissues demonstrate no acute finding. Bandlike consolidative opacity within the partially visualized right upper and lower lobes. Associated pleuroparenchymal thickening about the visualized right lung with small layering right pleural effusion. Findings are similar to prior CT with these changes are better evaluated. Disc levels: T1-2:  Unremarkable. T2-3: Normal interspace. Mild right-sided facet spurring. No canal or foraminal stenosis.  T3-4:  Minimal disc bulge.  No canal or foraminal stenosis. T4-5:  Mild disc bulge.  Minimal facet hypertrophy.  No stenosis. T5-6: Mild disc bulge. 3 mm bony retropulsion related to the T5 compression fracture indents the central to right paracentral thecal sac (series 21, image 18). Mild cord flattening without cord signal changes or significant spinal stenosis. Foramina remain patent. T6-7: Left paracentral disc protrusion indents the left ventral thecal sac (series 21, image 20). Mild cord flattening without cord signal changes or significant spinal stenosis. Foramina remain patent. T7-8: Tiny right paracentral disc protrusion indents the right ventral thecal sac without stenosis (series 21, image 24). T8-9: Chronic endplate Schmorl's node deformity without significant disc bulge or stenosis. T9-10: Right paracentral disc extrusion with superior migration without significant stenosis (series 21, image 29). T10-11: Disc desiccation with mild disc bulge and endplate spurring. Chronic endplate Schmorl's node deformity. Mild facet hypertrophy. No stenosis. T11-12:  Minimal disc bulge.  No stenosis. T12-L1: Seen only on sagittal projection. Mild diffuse disc bulge with bilateral facet hypertrophy. No significant stenosis. IMPRESSION: 1. Acute/recent compression fracture involving the superior endplate of T5 with up to 40% height loss and trace 3 mm bony retropulsion. No significant stenosis. 2. Prominent endplate Schmorl's node deformity with reactive marrow edema at the adjacent inferior endplate of T4. This appears somewhat progressed as compared to prior CT, and could also contribute to back pain. 3. Mild multilevel degenerative spondylosis with a few scattered small disc protrusions as detailed above. No significant stenosis. 4. Bandlike consolidative opacity within the partially visualized right upper and lower lobes with associated pleuroparenchymal thickening and small layering right pleural effusion. Findings  are similar to prior chest CT with these changes are better evaluated. Electronically Signed   By: Morene Hoard M.D.   On: 04/06/2024 19:20    Labs:  CBC: Recent Labs    01/25/24 1459 01/30/24 1455 03/09/24 0924 05/04/24 0845  WBC 3.6* 4.2 5.2 5.2  HGB 11.3* 11.5* 11.8* 12.3  HCT 35.6* 36.0 35.8* 38.6  PLT 78* 94* 162 122*    COAGS: Recent Labs    08/02/23 1108 08/02/23 1440 12/17/23 0306 12/18/23 0401 12/19/23 0137 12/20/23 0541  INR 9.8*   < > 2.2* 2.4* 2.4* 3.0*  APTT 49*  --   --   --   --   --    < > = values in this interval not displayed.    BMP: Recent Labs    12/20/23 0541 01/25/24 1459 01/30/24 1455 03/09/24 0924  NA 138 136 135 137  K 4.0 3.6 3.8 4.4  CL 102 102 102 104  CO2 28 26 24 25   GLUCOSE 231* 135* 174* 243*  BUN 22 9 9 14   CALCIUM  8.3* 9.0 8.8* 9.0  CREATININE 0.84 0.85 0.86 0.82  GFRNONAA >60 >60 >60 >60    LIVER FUNCTION TESTS: Recent Labs    12/07/23 1858 12/11/23 2106 01/25/24 1459 03/09/24 0924  BILITOT 0.9 0.6 0.4 0.3  AST 31 16 20 19   ALT 18 10 8 10   ALKPHOS 61 40 39 67  PROT 7.7 7.5  7.2 8.2*  ALBUMIN 3.4* 2.8* 3.3* 3.2*    TUMOR MARKERS: No results for input(s): AFPTM, CEA, CA199, CHROMGRNA in the last 8760 hours.  Assessment and Plan: Per Dr. Thelda Progress Note date 11/24: 73 y/o F comorbid w PMHx of COPD w treated superimposed PNA, and metastatic RUL CA. Atraumatic adjacent T4 and T5 vertebral body fractures, confirmed on MR T-spine (04/06/24), question pathologic given her malignant Hx.Ongoing, life-style limiting pain secondary to the fractures, refractory to conservative management.   [...] She is interested in pursuing a minimally-invasive option for the treatment for her thoracic fractures at this time, and is curious about T4 and T5 VERTEBRAL AUGMENTATION with KYPHOPLASTY, along with concurrent BONE BIOPSY.  Patient presents for scheduled T4/T5 kyphoplasty and concurrent bone biopsy in  IR today.  Patient has been NPO since midnight.  All labs and medications are within acceptable parameters.  No pertinent allergies.   Risks and benefits of bone biopsy was discussed with the patient and/or patient's family including, but not limited to bleeding, infection, damage to adjacent structures or low yield requiring additional tests.  Risks and benefits of T4/T5 vertebral augmentation were discussed with the patient including, but not limited to education regarding the natural healing process of compression fractures without intervention, bleeding, infection, cement migration which may cause spinal cord damage, paralysis, pulmonary embolism or even death.  This interventional procedure involves the use of X-rays and because of the nature of the planned procedure, it is possible that we will have prolonged use of X-ray fluoroscopy.  Potential radiation risks to you include (but are not limited to) the following: - A slightly elevated risk for cancer several years later in life. This risk is typically less than 0.5% percent. This risk is low in comparison to the normal incidence of human cancer, which is 33% for women and 50% for men according to the [employer] Society. - Radiation induced injury can include skin redness, resembling a rash, tissue breakdown / ulcers and hair loss (which can be temporary or permanent).   The likelihood of either of these occurring depends on the difficulty of the procedure and whether you are sensitive to radiation due to previous procedures, disease, or genetic conditions.   IF your procedure requires a prolonged use of radiation, you will be notified and given written instructions for further action.  It is your responsibility to monitor the irradiated area for the 2 weeks following the procedure and to notify your physician if you are concerned that you have suffered a radiation induced injury.    All of the patient's questions were answered,  patient is agreeable to proceed.  Consent signed and in chart.     Thank you for allowing our service to participate in Kara [name] 's care.  Electronically Signed: Carlin DELENA Griffon, PA-C   05/04/2024, 9:04 AM      I spent a total of 25 Minutes in face to face in clinical consultation, greater than 50% of which was counseling/coordinating care for symptomatic T4/T5 vertebral compression fractures refractory to conservative management, with consideration for bone biopsy and kyphoplasty.

## 2024-05-03 NOTE — Progress Notes (Signed)
 Patient for IR T4 & T5 Kyphoplasty on Friday 05/04/24, I called and spoke with the patient on the phone and gave pre-procedure instructions. Pt was made aware to be here at 8:30a, last dose of Warfarin was Sun 04/29/24,  NPO after MN prior to procedure as well as driver post procedure/recovery/discharge. Pt stated understanding.  Called 05/03/24

## 2024-05-04 ENCOUNTER — Ambulatory Visit: Admission: RE | Admit: 2024-05-04 | Discharge: 2024-05-04 | Attending: Interventional Radiology | Admitting: Radiology

## 2024-05-04 ENCOUNTER — Other Ambulatory Visit: Payer: Self-pay | Admitting: Interventional Radiology

## 2024-05-04 DIAGNOSIS — S22059A Unspecified fracture of T5-T6 vertebra, initial encounter for closed fracture: Secondary | ICD-10-CM | POA: Diagnosis present

## 2024-05-04 DIAGNOSIS — S22040A Wedge compression fracture of fourth thoracic vertebra, initial encounter for closed fracture: Secondary | ICD-10-CM

## 2024-05-04 DIAGNOSIS — X58XXXA Exposure to other specified factors, initial encounter: Secondary | ICD-10-CM | POA: Diagnosis not present

## 2024-05-04 DIAGNOSIS — S22050A Wedge compression fracture of T5-T6 vertebra, initial encounter for closed fracture: Secondary | ICD-10-CM

## 2024-05-04 DIAGNOSIS — Z85118 Personal history of other malignant neoplasm of bronchus and lung: Secondary | ICD-10-CM | POA: Diagnosis not present

## 2024-05-04 HISTORY — PX: IR KYPHO THORACIC WITH BONE BIOPSY: IMG5518

## 2024-05-04 HISTORY — PX: IR KYPHO EA ADDL LEVEL THORACIC OR LUMBAR: IMG5520

## 2024-05-04 LAB — CBC WITH DIFFERENTIAL/PLATELET
Abs Immature Granulocytes: 0.01 K/uL (ref 0.00–0.07)
Basophils Absolute: 0 K/uL (ref 0.0–0.1)
Basophils Relative: 0 %
Eosinophils Absolute: 0.1 K/uL (ref 0.0–0.5)
Eosinophils Relative: 2 %
HCT: 38.6 % (ref 36.0–46.0)
Hemoglobin: 12.3 g/dL (ref 12.0–15.0)
Immature Granulocytes: 0 %
Lymphocytes Relative: 17 %
Lymphs Abs: 0.9 K/uL (ref 0.7–4.0)
MCH: 29.6 pg (ref 26.0–34.0)
MCHC: 31.9 g/dL (ref 30.0–36.0)
MCV: 93 fL (ref 80.0–100.0)
Monocytes Absolute: 0.5 K/uL (ref 0.1–1.0)
Monocytes Relative: 10 %
Neutro Abs: 3.7 K/uL (ref 1.7–7.7)
Neutrophils Relative %: 71 %
Platelets: 122 K/uL — ABNORMAL LOW (ref 150–400)
RBC: 4.15 MIL/uL (ref 3.87–5.11)
RDW: 16.1 % — ABNORMAL HIGH (ref 11.5–15.5)
WBC: 5.2 K/uL (ref 4.0–10.5)
nRBC: 0 % (ref 0.0–0.2)

## 2024-05-04 LAB — BASIC METABOLIC PANEL WITH GFR
Anion gap: 11 (ref 5–15)
BUN: 13 mg/dL (ref 8–23)
CO2: 27 mmol/L (ref 22–32)
Calcium: 9.5 mg/dL (ref 8.9–10.3)
Chloride: 104 mmol/L (ref 98–111)
Creatinine, Ser: 0.75 mg/dL (ref 0.44–1.00)
GFR, Estimated: >60 mL/min (ref >60)
Glucose, Bld: 143 mg/dL — ABNORMAL HIGH (ref 70–99)
Potassium: 3.9 mmol/L (ref 3.5–5.1)
Sodium: 142 mmol/L (ref 135–145)

## 2024-05-04 LAB — GLUCOSE, CAPILLARY
Glucose-Capillary: 131 mg/dL — ABNORMAL HIGH (ref 70–99)
Glucose-Capillary: 133 mg/dL — ABNORMAL HIGH (ref 70–99)

## 2024-05-04 LAB — PROTIME-INR
INR: 1.1 (ref 0.8–1.2)
Prothrombin Time: 15 s (ref 11.4–15.2)

## 2024-05-04 MED ORDER — LIDOCAINE HCL 1 % IJ SOLN
INTRAMUSCULAR | Status: AC
  Filled 2024-05-04: qty 20

## 2024-05-04 MED ORDER — MIDAZOLAM HCL 2 MG/2ML IJ SOLN
INTRAMUSCULAR | Status: AC
  Filled 2024-05-04: qty 2

## 2024-05-04 MED ORDER — BUPIVACAINE HCL 0.5 % IJ SOLN
50.0000 mL | Freq: Once | INTRAMUSCULAR | Status: AC
  Administered 2024-05-04: 30 mL

## 2024-05-04 MED ORDER — BUPIVACAINE HCL (PF) 0.5 % IJ SOLN
INTRAMUSCULAR | Status: AC
  Filled 2024-05-04: qty 30

## 2024-05-04 MED ORDER — ONDANSETRON HCL 4 MG/2ML IJ SOLN
INTRAMUSCULAR | Status: AC | PRN
  Administered 2024-05-04: 4 mg via INTRAVENOUS

## 2024-05-04 MED ORDER — MIDAZOLAM HCL (PF) 2 MG/2ML IJ SOLN
INTRAMUSCULAR | Status: AC | PRN
  Administered 2024-05-04 (×2): .5 mg via INTRAVENOUS
  Administered 2024-05-04: 1 mg via INTRAVENOUS
  Administered 2024-05-04 (×4): .5 mg via INTRAVENOUS

## 2024-05-04 MED ORDER — SODIUM CHLORIDE 0.9 % IV SOLN: INTRAVENOUS | Status: DC

## 2024-05-04 MED ORDER — DIPHENHYDRAMINE HCL 50 MG/ML IJ SOLN
INTRAMUSCULAR | Status: AC | PRN
  Administered 2024-05-04 (×2): 25 mg via INTRAVENOUS

## 2024-05-04 MED ORDER — CEFAZOLIN SODIUM-DEXTROSE 2-4 GM/100ML-% IV SOLN: 2.0000 g | INTRAVENOUS | Status: DC

## 2024-05-04 MED ORDER — OXYCODONE HCL 5 MG PO TABS
10.0000 mg | ORAL_TABLET | ORAL | Status: DC | PRN
  Administered 2024-05-04: 10 mg via ORAL

## 2024-05-04 MED ORDER — FENTANYL CITRATE (PF) 100 MCG/2ML IJ SOLN
INTRAMUSCULAR | Status: AC
  Filled 2024-05-04: qty 2

## 2024-05-04 MED ORDER — CEFAZOLIN SODIUM-DEXTROSE 2-4 GM/100ML-% IV SOLN
INTRAVENOUS | Status: AC
  Filled 2024-05-04: qty 100

## 2024-05-04 MED ORDER — LIDOCAINE HCL 1 % IJ SOLN
20.0000 mL | Freq: Once | INTRAMUSCULAR | Status: AC
  Administered 2024-05-04: 70 mL via INTRADERMAL

## 2024-05-04 MED ORDER — ONDANSETRON HCL 4 MG/2ML IJ SOLN
INTRAMUSCULAR | Status: AC
  Filled 2024-05-04: qty 2

## 2024-05-04 MED ORDER — CEFAZOLIN SODIUM-DEXTROSE 2-4 GM/100ML-% IV SOLN
INTRAVENOUS | Status: AC | PRN
  Administered 2024-05-04: 2 g via INTRAVENOUS

## 2024-05-04 MED ORDER — FENTANYL CITRATE (PF) 100 MCG/2ML IJ SOLN
INTRAMUSCULAR | Status: AC | PRN
  Administered 2024-05-04 (×5): 25 ug via INTRAVENOUS
  Administered 2024-05-04: 50 ug via INTRAVENOUS
  Administered 2024-05-04: 25 ug via INTRAVENOUS

## 2024-05-04 MED ORDER — IOHEXOL 300 MG/ML  SOLN
25.0000 mL | Freq: Once | INTRAMUSCULAR | Status: AC | PRN
  Administered 2024-05-04: 25 mL

## 2024-05-04 MED ORDER — OXYCODONE HCL 5 MG PO TABS
ORAL_TABLET | ORAL | Status: AC
  Filled 2024-05-04: qty 2

## 2024-05-04 MED FILL — Midazolam HCl Inj 2 MG/2ML (Base Equivalent): INTRAMUSCULAR | Qty: 2 | Status: AC

## 2024-05-04 MED FILL — Diphenhydramine HCl Inj 50 MG/ML: INTRAMUSCULAR | Qty: 1 | Status: AC

## 2024-05-04 MED FILL — Lidocaine HCl Local Inj 1%: INTRAMUSCULAR | Qty: 20 | Status: AC

## 2024-05-04 NOTE — Procedures (Signed)
 Vascular and Interventional Radiology Procedure Note  Patient: Kara [name] DOB: 06-19-1950 Medical Record Number: [id number] Note Date/Time: 05/04/2024 9:55 AM   Performing Physician: Thom Hall, MD Assistant(s): None  Diagnosis: Symptomatic T4 and T5 vertebral body fracture. 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 A total of 4 mL PMMA was used. Hemostasis of the tract was achieved using Manual Pressure.  Plan: Bed rest for 1 hours.  See detailed procedure note with images in PACS. The patient tolerated the procedure well without incident or complication and was returned to Recovery in stable condition.    Thom Hall, MD [facility]   Pager. [phone] Clinic. [phone]

## 2024-05-04 NOTE — Progress Notes (Signed)
 Pt. Med. With Oxycodone /Roxycodone 10 mg p.o. for c/o mid thoracic pain . When pt. Was sat up 45 degrees, pt. States pain went down to a 5 out of 10 on scale 1-10.

## 2024-05-04 NOTE — Progress Notes (Signed)
 Pt. States pain to thoracic spine bilat. Down to a 2/10 being still, then states pain goes up to a 7 when I got up out of bed. Pt. With a steady gait, no neuro deficits when OOB. KYM Griffon, PA and Dr. Hughes in now to see pt. Pt. OK for DC home now with friend Dot.

## 2024-05-04 NOTE — Progress Notes (Signed)
°  IR BRIEF PROGRESS NOTE:  Patient seen at bedside with Dr. Hughes s/p T4 and T5 kyphoplasty with bone biopsy at each level. Patient is eating and overall, feeling well.  On physical exam, patient with full sensation and baseline strength in all extremities. Vitals stable. Patient without overt tenderness s/p procedure.  Pt will be followed up by Dr. Hughes in 1 month in outpatient clinic vs. virtual visit. Patient knows to reach out sooner with any concerns. All questions answered. Patient ok to discharge.   Electronically Signed: Carlin DELENA Griffon, PA-C 05/04/2024, 1:00 PM

## 2024-05-08 ENCOUNTER — Encounter: Payer: Self-pay | Admitting: Internal Medicine

## 2024-05-08 LAB — SURGICAL PATHOLOGY

## 2024-05-10 ENCOUNTER — Encounter: Payer: Self-pay | Admitting: Internal Medicine

## 2024-05-14 ENCOUNTER — Ambulatory Visit
Admission: RE | Admit: 2024-05-14 | Discharge: 2024-05-14 | Disposition: A | Discharge status: Home / Self Care | Source: Ambulatory Visit | Attending: Oncology | Admitting: Oncology

## 2024-05-14 DIAGNOSIS — R918 Other nonspecific abnormal finding of lung field: Secondary | ICD-10-CM | POA: Diagnosis not present

## 2024-05-14 DIAGNOSIS — K8020 Calculus of gallbladder without cholecystitis without obstruction: Secondary | ICD-10-CM | POA: Diagnosis not present

## 2024-05-14 DIAGNOSIS — I700 Atherosclerosis of aorta: Secondary | ICD-10-CM | POA: Insufficient documentation

## 2024-05-14 DIAGNOSIS — R590 Localized enlarged lymph nodes: Secondary | ICD-10-CM | POA: Diagnosis not present

## 2024-05-14 DIAGNOSIS — I2510 Atherosclerotic heart disease of native coronary artery without angina pectoris: Secondary | ICD-10-CM | POA: Insufficient documentation

## 2024-05-14 DIAGNOSIS — K439 Ventral hernia without obstruction or gangrene: Secondary | ICD-10-CM | POA: Diagnosis not present

## 2024-05-14 DIAGNOSIS — K119 Disease of salivary gland, unspecified: Secondary | ICD-10-CM | POA: Insufficient documentation

## 2024-05-14 DIAGNOSIS — C3411 Malignant neoplasm of upper lobe, right bronchus or lung: Secondary | ICD-10-CM | POA: Diagnosis present

## 2024-05-14 LAB — GLUCOSE, CAPILLARY: Glucose-Capillary: 122 mg/dL — ABNORMAL HIGH (ref 70–99)

## 2024-05-14 MED ORDER — FLUDEOXYGLUCOSE F - 18 (FDG) INJECTION
7.5000 | Freq: Once | INTRAVENOUS | Status: AC | PRN
  Administered 2024-05-14: 7.72 via INTRAVENOUS

## 2024-05-21 ENCOUNTER — Ambulatory Visit: Admitting: Internal Medicine

## 2024-05-24 ENCOUNTER — Encounter: Payer: Self-pay | Admitting: Oncology

## 2024-05-24 ENCOUNTER — Encounter: Payer: Self-pay | Admitting: Internal Medicine

## 2024-05-28 ENCOUNTER — Other Ambulatory Visit: Payer: Self-pay | Admitting: Oncology

## 2024-05-28 ENCOUNTER — Ambulatory Visit: Admitting: Pain Medicine

## 2024-05-29 ENCOUNTER — Encounter: Payer: Self-pay | Admitting: Oncology

## 2024-05-29 ENCOUNTER — Encounter: Payer: Self-pay | Admitting: Internal Medicine

## 2024-05-30 ENCOUNTER — Encounter: Payer: Self-pay | Admitting: Oncology

## 2024-05-30 ENCOUNTER — Inpatient Hospital Stay: Admitting: Oncology

## 2024-05-30 ENCOUNTER — Inpatient Hospital Stay: Attending: Oncology

## 2024-05-30 VITALS — BP 108/57 | HR 105 | Temp 98.9°F | Resp 18 | Wt 160.7 lb

## 2024-05-30 DIAGNOSIS — C3411 Malignant neoplasm of upper lobe, right bronchus or lung: Secondary | ICD-10-CM | POA: Diagnosis not present

## 2024-05-30 DIAGNOSIS — Z809 Family history of malignant neoplasm, unspecified: Secondary | ICD-10-CM

## 2024-05-30 DIAGNOSIS — Z79899 Other long term (current) drug therapy: Secondary | ICD-10-CM

## 2024-05-30 DIAGNOSIS — Z803 Family history of malignant neoplasm of breast: Secondary | ICD-10-CM | POA: Diagnosis not present

## 2024-05-30 DIAGNOSIS — C3490 Malignant neoplasm of unspecified part of unspecified bronchus or lung: Secondary | ICD-10-CM | POA: Diagnosis not present

## 2024-05-30 DIAGNOSIS — Z7722 Contact with and (suspected) exposure to environmental tobacco smoke (acute) (chronic): Secondary | ICD-10-CM

## 2024-05-30 LAB — CBC WITH DIFFERENTIAL (CANCER CENTER ONLY)
Abs Immature Granulocytes: 0.02 K/uL (ref 0.00–0.07)
Basophils Absolute: 0 K/uL (ref 0.0–0.1)
Basophils Relative: 0 %
Eosinophils Absolute: 0.1 K/uL (ref 0.0–0.5)
Eosinophils Relative: 1 %
HCT: 35.6 % — ABNORMAL LOW (ref 36.0–46.0)
Hemoglobin: 11.4 g/dL — ABNORMAL LOW (ref 12.0–15.0)
Immature Granulocytes: 0 %
Lymphocytes Relative: 11 %
Lymphs Abs: 0.6 K/uL — ABNORMAL LOW (ref 0.7–4.0)
MCH: 30.2 pg (ref 26.0–34.0)
MCHC: 32 g/dL (ref 30.0–36.0)
MCV: 94.2 fL (ref 80.0–100.0)
Monocytes Absolute: 0.4 K/uL (ref 0.1–1.0)
Monocytes Relative: 8 %
Neutro Abs: 4.2 K/uL (ref 1.7–7.7)
Neutrophils Relative %: 80 %
Platelet Count: 117 K/uL — ABNORMAL LOW (ref 150–400)
RBC: 3.78 MIL/uL — ABNORMAL LOW (ref 3.87–5.11)
RDW: 15.3 % (ref 11.5–15.5)
WBC Count: 5.2 K/uL (ref 4.0–10.5)
nRBC: 0 % (ref 0.0–0.2)

## 2024-05-30 LAB — CMP (CANCER CENTER ONLY)
ALT: <5 U/L (ref 0–44)
AST: 15 U/L (ref 15–41)
Albumin: 3.6 g/dL (ref 3.5–5.0)
Alkaline Phosphatase: 64 U/L (ref 38–126)
Anion gap: 11 (ref 5–15)
BUN: 9 mg/dL (ref 8–23)
CO2: 25 mmol/L (ref 22–32)
Calcium: 9.2 mg/dL (ref 8.9–10.3)
Chloride: 104 mmol/L (ref 98–111)
Creatinine: 0.9 mg/dL (ref 0.44–1.00)
GFR, Estimated: >60 mL/min
Glucose, Bld: 216 mg/dL — ABNORMAL HIGH (ref 70–99)
Potassium: 3.6 mmol/L (ref 3.5–5.1)
Sodium: 140 mmol/L (ref 135–145)
Total Bilirubin: 0.3 mg/dL (ref 0.0–1.2)
Total Protein: 7.2 g/dL (ref 6.5–8.1)

## 2024-05-31 ENCOUNTER — Encounter: Payer: Self-pay | Admitting: *Deleted

## 2024-05-31 ENCOUNTER — Inpatient Hospital Stay

## 2024-05-31 DIAGNOSIS — C3411 Malignant neoplasm of upper lobe, right bronchus or lung: Secondary | ICD-10-CM

## 2024-05-31 NOTE — Progress Notes (Signed)
 Per Dr. Melanee, pt needs to come in for lab draw to collect Tempus liquid biopsy to check for further mutations due to recent lung cancer progression. Called pt and made aware of MD and tumor board recommendations. Pt scheduled for lab draw on 1/9 at 11am. Pt instructed to continue taking Tagrisso  and will be scheduled for follow up visit with Dr. Melanee to further discuss liquid biopsy results and potential treatment changes.

## 2024-06-01 ENCOUNTER — Inpatient Hospital Stay

## 2024-06-01 ENCOUNTER — Encounter: Payer: Self-pay | Admitting: Internal Medicine

## 2024-06-01 ENCOUNTER — Encounter: Payer: Self-pay | Admitting: Oncology

## 2024-06-01 DIAGNOSIS — C3411 Malignant neoplasm of upper lobe, right bronchus or lung: Secondary | ICD-10-CM

## 2024-06-01 LAB — MISCELLANEOUS TEST

## 2024-06-02 ENCOUNTER — Encounter: Payer: Self-pay | Admitting: Internal Medicine

## 2024-06-02 ENCOUNTER — Encounter: Payer: Self-pay | Admitting: Oncology

## 2024-06-02 NOTE — Progress Notes (Signed)
 "    Hematology/Oncology Consult note [facility]  Telephone:([phone] [phone]  Patient Care Team: Sadie Manna, MD as PCP - General (Internal Medicine) Verdene Gills, RN as Oncology Nurse Navigator Melanee Annah BROCKS, MD as Consulting Physician (Oncology)   Name of the patient: Kara [name]  [id number]  [birth year]   Date of visit: 06/02/2024  Diagnosis-recurrent stage III lung cancer  Chief complaint/ Reason for visit-routine follow-up of lung cancer presently on Tagrisso   Heme/Onc history: Patient is a 74 year old female who underwent SBRT for presumed right upper lobe lung cancer in December 2022. She was in remission until November 2024 when she was noted to have hypermetabolic in the right hilum as well as mediastinal region. 15 x 12 mm nodular opacity in the dome of the right diaphragm. 10 mm left cervical lymph node. 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 Right pleural thoracentesis negative for malignancy.  Patient completed concurrent chemoradiation with weekly CarboTaxol in March 2025.  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  MSI not high.  CT DNA tumor fraction 7%   CT scans after concurrent chemoradiation showed stable disease with stable mediastinal adenopathy asWell as postradiation changes noted in the perihilar right lung.  She he is on adjuvant Tagrisso  starting May 2025.  Initially patient was on full dose 80 mg daily but reduced to 40 mg subsequently due to diarrhea    Interval history- Discussed the use of AI scribe software for clinical note transcription with the patient, who gave verbal consent to proceed.  History of Present Illness   Kara [name] is a 74 year old female with right lung cancer status post chemoradiation, currently on  Tagrisso , who presents for oncology follow-up to evaluate persistent dry cough and review imaging for possible disease progression.  She was diagnosed with right lung cancer in December 2024 and treated with chemoradiation. She has been maintained on Tagrisso  40 mg daily since May 2025, with the dose limited by prior intolerance to higher doses due to diarrhea. Recent PET imaging demonstrates increased uptake in the right lung, particularly in the right lower lobe.  She reports a persistent dry, non-productive cough for approximately one month, without fever or other respiratory symptoms. She manages the cough with cough drops and was prescribed promethazine  by another provider. She is scheduled to follow up with her pulmonologist next week.  She is status post lumbar spine surgery for a compression fracture at L4-L5, with intraoperative bone biopsy negative for malignancy. She experiences intermittent sharp pain at the surgical site, typically requiring oxycodone  5 mg once daily, which she avoids during the day due to work obligations. She is uncertain about the expected duration of healing but notes ongoing discomfort.  She mentioned a financial paperwork issue related to her care that she was unable to address at this visit due to home heating issues and time constraints.       ECOG PS- 2 Pain scale- 2   Review of systems- Review of Systems  Constitutional:  Positive for malaise/fatigue. Negative for chills, fever and weight loss.  HENT:  Negative for congestion, ear discharge and nosebleeds.   Eyes:  Negative for blurred vision.  Respiratory:  Positive for cough. Negative for hemoptysis, sputum production, shortness of breath and wheezing.   Cardiovascular:  Negative for chest pain, palpitations, orthopnea and claudication.  Gastrointestinal:  Negative for abdominal pain, blood in stool, constipation, diarrhea, heartburn, melena, nausea and vomiting.  Genitourinary:  Negative for dysuria,  flank pain, frequency, hematuria and urgency.  Musculoskeletal:  Positive for back pain. Negative for joint pain and myalgias.  Skin:  Negative for rash.  Neurological:  Negative for dizziness, tingling, focal weakness, seizures, weakness and headaches.  Endo/Heme/Allergies:  Does not bruise/bleed easily.  Psychiatric/Behavioral:  Negative for depression and suicidal ideas. The patient does not have insomnia.       Allergies[1]   Past Medical History:  Diagnosis Date   Anginal pain    Arthritis    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, 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   CHF (congestive heart failure) (HCC)    followed colon resection, wouldn't let me get up   Chronic anticoagulation    Degenerative disc disease, lumbar    Diverticulitis 2018   Diverticulosis    DVT of lower extremity, bilateral (HCC) 2018   Essential hypertension    History of being hospitalized    1 month ago for 3 days, vomiting, and kink in upper intestine   History of hiatal hernia    Mediastinal adenopathy 04/2023   MRSA nasal colonization    Non-small cell lung cancer (HCC) 2022   right   Obesity (BMI 30-39.9)    Osteoarthritis    Osteoporosis    PE (pulmonary thromboembolism) (HCC) 2018   Pneumonia    Small bowel obstruction (HCC) 03/20/2023   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HCC)    Type 2 diabetes mellitus with obesity    Vertigo      Past Surgical History:  Procedure Laterality Date   APPENDECTOMY N/A 02/24/2017   Procedure: Incidental  APPENDECTOMY;  Surgeon: Shelva Dunnings, MD;  Location: [name];  Service: General;  Laterality: N/A;   CARPAL TUNNEL RELEASE Bilateral    CATARACT EXTRACTION W/PHACO Left 11/21/2018   Procedure: CATARACT EXTRACTION PHACO AND INTRAOCULAR LENS PLACEMENT (IOC) LEFT DIABETES;  Surgeon: Jaye Fallow, MD;  Location: [facility];  Service: Ophthalmology;  Laterality: Left;  latex sensitivity Diabetic - oral meds   CATARACT EXTRACTION W/PHACO Right 12/12/2018    Procedure: CATARACT EXTRACTION PHACO AND INTRAOCULAR LENS PLACEMENT (IOC) RIGHT DIABETES;  Surgeon: Jaye Fallow, MD;  Location: [facility];  Service: Ophthalmology;  Laterality: Right;  Diabetes-oral med Latex sensitiviy   COLON RESECTION SIGMOID N/A 11/02/2016   Procedure: COLON RESECTION SIGMOID;  Surgeon: Shelva Dunnings, MD;  Location: [name];  Service: General;  Laterality: N/A;   COLON SURGERY  11/02/2016   COLONOSCOPY WITH PROPOFOL  N/A 02/03/2017   Procedure: COLONOSCOPY WITH PROPOFOL ;  Surgeon: Unk Corinn Skiff, MD;  Location: [name];  Service: Gastroenterology;  Laterality: N/A;   COLOSTOMY N/A 11/02/2016   Procedure: COLOSTOMY;  Surgeon: Shelva Dunnings, MD;  Location: [name];  Service: General;  Laterality: N/A;   COLOSTOMY REVERSAL N/A 02/24/2017   Procedure: COLOSTOMY REVERSAL, ostomy takedown, spleenic flexure mobilization, excision rectal stump/distal sigmoid, anastomosis with suture reinforcement;  Surgeon: Shelva Dunnings, MD;  Location: [name];  Service: General;  Laterality: N/A;   ENDOBRONCHIAL ULTRASOUND N/A 05/16/2023   Procedure: ENDOBRONCHIAL ULTRASOUND;  Surgeon: Tamea Dedra CROME, MD;  Location: [name];  Service: Cardiopulmonary;  Laterality: N/A;   FLEXIBLE BRONCHOSCOPY N/A 05/16/2023   Procedure: FLEXIBLE BRONCHOSCOPY;  Surgeon: Tamea Dedra CROME, MD;  Location: [name];  Service: Cardiopulmonary;  Laterality: N/A;   ILEO LOOP DIVERSION N/A 02/24/2017   Procedure: ILEO LOOP COLOSTOMY;  Surgeon: Shelva Dunnings,  MD;  Location: [name];  Service: General;  Laterality: N/A;   ILEOSTOMY CLOSURE N/A 06/01/2017   Procedure: LOOP ILEOSTOMY TAKEDOWN;  Surgeon: Shelva Dunnings, MD;  Location: [name];  Service: General;  Laterality: N/A;   IR IMAGING GUIDED PORT INSERTION  06/14/2023   IR KYPHO EA ADDL LEVEL THORACIC OR LUMBAR  05/04/2024   IR KYPHO THORACIC WITH BONE BIOPSY  05/04/2024   IR RADIOLOGIST EVAL & MGMT  04/16/2024    IR REMOVAL TUN ACCESS W/ PORT W/O FL MOD SED  11/16/2023   IVC FILTER INSERTION Right 10/2016   IVC FILTER REMOVAL N/A 08/09/2017   Procedure: IVC FILTER REMOVAL;  Surgeon: Jama Cordella MATSU, MD;  Location: [name];  Service: Cardiovascular;  Laterality: N/A;   KNEE SURGERY Right    torn menicus   LAPAROSCOPY N/A 02/24/2017   Procedure: LAPAROSCOPY DIAGNOSTIC;  Surgeon: Shelva Dunnings, MD;  Location: [name];  Service: General;  Laterality: N/A;   LYSIS OF ADHESION N/A 02/24/2017   Procedure: LYSIS OF ADHESION;  Surgeon: Shelva Dunnings, MD;  Location: [name];  Service: General;  Laterality: N/A;   PULMONARY VENOGRAPHY N/A 11/19/2016   Procedure: Pulmonary Venography; IVC filter placement; possible pulmonary thrombectomy;  Surgeon: Jama Cordella MATSU, MD;  Location: [name];  Service: Cardiovascular;  Laterality: N/A;   SACROPLASTY N/A 11/08/2017   Procedure: SACROPLASTY S2;  Surgeon: Kathlynn Sharper, MD;  Location: [name];  Service: Orthopedics;  Laterality: N/A;   SIGMOIDOSCOPY N/A 11/13/2016   Procedure: endoscopic  flexible SIGMOIDOSCOPY;  Surgeon: Wonda Charlie BRAVO, MD;  Location: [name];  Service: General;  Laterality: N/A;   SIGMOIDOSCOPY N/A 05/19/2017   Procedure: ENID MORIN;  Surgeon: Shelva Dunnings, MD;  Location: [name];  Service: General;  Laterality: N/A;   VIDEO BRONCHOSCOPY WITH ENDOBRONCHIAL NAVIGATION N/A 02/23/2021   Procedure: ROBOTIC VIDEO BRONCHOSCOPY WITH ENDOBRONCHIAL NAVIGATION;  Surgeon: Tamea Dedra CROME, MD;  Location: [name];  Service: Pulmonary;  Laterality: N/A;    Social History   Socioeconomic History   Marital status: Married    Spouse name: Butler   Number of children: 1   Years of education: Not on file   Highest education level: Not on file  Occupational History   Not on file  Tobacco Use   Smoking status: Never    Passive exposure: Current (husband smokes)   Smokeless tobacco: Never  Vaping  Use   Vaping status: Never Used  Substance and Sexual Activity   Alcohol  use: No    Alcohol /week: 0.0 standard drinks of alcohol    Drug use: No   Sexual activity: Never  Other Topics Concern   Not on file  Social History Narrative   Not on file   Social Drivers of Health   Tobacco Use: Medium Risk (05/30/2024)   Patient History    Smoking Tobacco Use: Never    Smokeless Tobacco Use: Never    Passive Exposure: Current  Financial Resource Strain: Low Risk  (05/18/2024)   Received [facility]   Overall Financial Resource Strain (CARDIA)    Difficulty of Paying Living Expenses: Not hard at all  Food Insecurity: No Food Insecurity (05/18/2024)   Received from [facility]   Epic    Within the past 12 months, you worried that your food would run out before you got the money to buy more.: Never true    Within the past 12 months, the food you bought just didn't last  and you didn't have money to get more.: Never true  Transportation Needs: No Transportation Needs (05/18/2024)   Received from [facility]    In the past 12 months, has lack of transportation kept you from medical appointments or from getting medications?: No    Lack of Transportation (Non-Medical): No  Physical Activity: Not on file  Stress: Not on file  Social Connections: Moderately Isolated (12/12/2023)   Social Connection and Isolation Panel    Frequency of Communication with Friends and Family: More than three times a week    Frequency of Social Gatherings with Friends and Family: Once a week    Attends Religious Services: Never    Database Administrator or Organizations: No    Attends Banker Meetings: Never    Marital Status: Married  Catering Manager Violence: Not At Risk (12/12/2023)   Epic    Fear of Current or Ex-Partner: No    Emotionally Abused: No    Physically Abused: No    Sexually Abused: No  Depression  (PHQ2-9): Low Risk (04/12/2024)   Depression (PHQ2-9)    PHQ-2 Score: 2  Alcohol  Screen: Not on file  Housing: Low Risk  (05/18/2024)   Received from [facility]   Epic    In the last 12 months, was there a time when you were not able to pay the mortgage or rent on time?: No    In the past 12 months, how many times have you moved where you were living?: 0    At any time in the past 12 months, were you homeless or living in a shelter (including now)?: No  Utilities: Not At Risk (05/18/2024)   Received from [facility]   Epic    In the past 12 months has the electric, gas, oil, or water company threatened to shut off services in your home?: No  Health Literacy: Not on file    Family History  Problem Relation Age of Onset   Diabetes Mother    Heart disease Mother    Cancer Mother    Breast cancer Mother        >50   Heart disease Father    Diabetes Sister    Heart disease Sister     Current Medications[2]  Physical exam:  Vitals:   05/30/24 1021  BP: (!) 108/57  Pulse: (!) 105  Resp: 18  Temp: 98.9 F (37.2 C)  SpO2: 98%  Weight: 160 lb 11.2 oz (72.9 kg)   Physical Exam Constitutional:      Comments: Sitting in a wheelchair.  Appears in no acute distress  Cardiovascular:     Rate and Rhythm: Normal rate and regular rhythm.     Heart sounds: Normal heart sounds.  Pulmonary:     Effort: Pulmonary effort is normal.     Breath sounds: Normal breath sounds.  Abdominal:     General: Bowel sounds are normal.     Palpations: Abdomen is soft.  Skin:    General: Skin is warm and dry.  Neurological:     Mental Status: She is alert and oriented to person, place, and time.      I have personally reviewed labs listed below:    Latest Ref Rng & Units 05/30/2024    9:32 AM  CMP  Glucose 70 - 99 mg/dL 783   BUN 8 - 23 mg/dL 9   Creatinine 9.55 - 8.99 mg/dL  0.90   Sodium 135 - 145 mmol/L 140   Potassium 3.5 - 5.1 mmol/L 3.6    Chloride 98 - 111 mmol/L 104   CO2 22 - 32 mmol/L 25   Calcium  8.9 - 10.3 mg/dL 9.2   Total Protein 6.5 - 8.1 g/dL 7.2   Total Bilirubin 0.0 - 1.2 mg/dL 0.3   Alkaline Phos 38 - 126 U/L 64   AST 15 - 41 U/L 15   ALT 0 - 44 U/L <5       Latest Ref Rng & Units 05/30/2024    9:32 AM  CBC  WBC 4.0 - 10.5 K/uL 5.2   Hemoglobin 12.0 - 15.0 g/dL 88.5   Hematocrit 63.9 - 46.0 % 35.6   Platelets 150 - 400 K/uL 117    I have personally reviewed Radiology images listed below: No images are attached to the encounter.  NM PET Image Restag (PS) Skull Base To Thigh Result Date: 05/27/2024 CLINICAL DATA:  Subsequent treatment strategy for lung cancer. EXAM: NUCLEAR MEDICINE PET SKULL BASE TO THIGH TECHNIQUE: 7.7 mCi F-18 FDG was injected intravenously. Full-ring PET imaging was performed from the skull base to thigh after the radiotracer. CT data was obtained and used for attenuation correction and anatomic localization. Fasting blood glucose: 122 mg/dl COMPARISON:  CT chest 89/70/7974, PET 01/13/2024. FINDINGS: Mediastinal blood pool activity: SUV max 2.5 Liver activity: SUV max NA NECK: 5 mm medial left parotid nodule (6/18), SUV max 2.6. No additional abnormal hypermetabolism. Incidental CT findings: None. CHEST: New hypermetabolism within post radiation collapse/consolidation in the anterior right upper lobe, SUV max 5.5. Hypermetabolic subpleural nodularity and pleural thickening in the lower right hemithorax, SUV max 5.7 inferomedially, new from 01/13/2024. Similar mildly hypermetabolic small subcarinal lymph node measures approximately 8 mm, SUV max 4.2. Incidental CT findings: Atherosclerotic calcification of the aorta, aortic valve and coronary arteries. Heart is enlarged. No pericardial effusion. Extensive post treatment consolidation in the right upper lobe and perihilar right middle and right lower lobes. ABDOMEN/PELVIS: Tiny hypermetabolic right retrocrural lymph node measures 5 mm (6/71), SUV max  2.9. Otherwise, no abnormal hypermetabolism. Incidental CT findings: Gallstones. Midline supraumbilical wide necked ventral hernia contain short segments of small bowel and transverse colon. Small bowel anastomosis in the right hemipelvis. SKELETON: No abnormal hypermetabolism. Incidental CT findings: Bilateral sacral augmentations. Degenerative changes in the spine. Midthoracic vertebral body augmentations. 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 IMPRESSION: 1. New hypermetabolism within post radiation scarring in the right upper lobe with new hypermetabolic subpleural nodularity in the inferior right hemithorax, findings indicative of disease progression. 2. Associated hypermetabolic subcarinal and retrocrural lymph nodes. 3. 5 mm medial left parotid nodule is visualized on PET, worrisome for malignancy. 4. Cholelithiasis. 5. Midline supraumbilical ventral hernia contains short segments of small bowel and transverse colon. 6. Aortic atherosclerosis (ICD10-I70.0). Coronary artery calcification. Electronically Signed   By: Newell Eke M.D.   On: 05/27/2024 15:58   IR KYPHO THORACIC WITH BONE BIOPSY Result Date: 05/04/2024 CLINICAL DATA:  Traumatic compression fracture of T4 thoracic vertebra closed initial encounter; Traumatic compression fracture of T5 thoracic vertebra closed initial encounter Briefly, 74 year old female with a history of metastatic RIGHT lung cancer with atraumatic adjacent T4 and T5 vertebral body fractures. Significant pain, failed conservative management. EXAM: T4 and T5 VERTEBRAL BODY BIOPSIES AND AUGMENTATION WITH BALLOON KYPHOPLASTY COMPARISON:  MR T-SPINE, 04/06/2024. CT CHEST, 03/21/2024. PET-CT, 01/13/2024. MEDICATIONS: As antibiotic prophylaxis, Ancef  2 gm IV was ordered pre-procedure and administered  intravenously within 1 hour of incision. 4 MG ZOFRAN  IV.  50 MG BENADRYL  IV. ANESTHESIA/SEDATION: Moderate (conscious) sedation was employed during this  procedure. A total of Versed  4 mg and Fentanyl  200 mcg was administered intravenously. Moderate Sedation Time: 104 minutes. The patient's level of consciousness and vital signs were monitored continuously by radiology nursing throughout the procedure under my direct supervision. FLUOROSCOPY: Radiation Exposure Index and estimated peak skin dose (PSD); Reference air kerma (RAK), 1526 mGy. COMPLICATIONS: None immediate. PROCEDURE: The procedure, risks (including but not limited to bleeding, infection, organ damage), benefits, and alternatives were explained to the patient and/or patient's representative. Questions regarding the procedure were encouraged and answered. The patient understands and consents to the procedure. The patient was placed prone on the fluoroscopic table. The skin overlying the lower lumbar spine region was then prepped and draped in the usual sterile fashion. Maximal barrier sterile technique was utilized including caps, mask, sterile gowns, sterile gloves, sterile drape, hand hygiene and skin antiseptic. IV fentanyl  and versed  were administered as conscious sedation during continuous cardiorespiratory monitoring by the radiology RN. The procedure began at T4 vertebral body; The LEFT pedicle at T4 was then infiltrated with 0.5% Marcaine  followed by the advancement of a Kyphon trocar needle through the pedicle into the posterior one-third of the vertebral body. The osteo drill was advanced to the anterior third of the vertebral body. The osteo drill was retracted. Through the working cannula, a Kyphon inflatable bone tamp 15 x 3 was advanced and positioned with the distal marker approximately 5 mm from the anterior aspect of the cortex. Appropriate positioning was confirmed on the AP projection. At this time, the balloon was expanded using contrast via a Kyphon inflation syringe device via micro tubing. In similar fashion, the RIGHT T4 pedicle was infiltrated with 0.5% Marcaine  followed by the  advancement of a second Kyphon trocar needle through the right pedicle into the posterior third of the vertebral body. Subsequently, the osteo drill was coaxially advanced to the anterior right third. The osteo drill was exchanged for a Kyphon inflatable bone tamp 15 x 3, advanced to the 5 mm of the anterior aspect of the cortex. The balloon was then expanded using contrast as above. The procedure was repeated identically at T5 vertebral body, allowing for bone biopsy and bipedicular cannulae. Simultaneous Inflations were continued until there was near apposition with the superior end plate. At this time, methylmethacrylate mixture was reconstituted in the Kyphon bone mixing device system. This was then loaded into the delivery mechanism, attached to Kyphon bone fillers. The balloons were deflated and removed followed by the instillation of methylmethacrylate mixture with excellent filling in the AP and lateral projections. No extravasation was noted in the disk spaces or posteriorly into the spinal canal. No epidural venous contamination was seen. The working cannulae and the bone filler were then retrieved and removed. Hemostasis was achieved with manual compression. The patient tolerated the procedure well without immediate postprocedural complication. Findings; FINDINGS: *Bone biopsy performed at both T4 and T5 vertebral bodies. *Adequate cement filling of the T4 and T5 vertebral bodies on both the AP and lateral projections. *T4 inferior endplate fracture with noted cement extravasation into the T4-T5 intervertebral disc space and RIGHT epidural veins. *No extravasation was noted posteriorly into the spinal canal or along the pedicular access. No T5 epidural venous contamination was seen. *The patient has suffered a fracture of the T4 and T5. It is recommended that patients aged 45 years or older be evaluated for  possible testing or treatment of osteoporosis. A copy of this procedure report is sent to the  patient's referring physician IMPRESSION: 1. Successful T4 and T5 vertebral body biopsies and cement augmentation with balloon kyphoplasty. 2. T4 inferior endplate fracture with small volume cement extravasation into the T4-T5 disc space, and RIGHT T4 epidural veins. 3. No posterior extravasation into the spinal canal, T5 epidural venous contamination, or along the pedicular access. Per CMS PQRS reporting requirements (PQRS Measure 24): Given the patient's age of greater than 50 and the fracture site (hip, distal radius, or spine), the patient should be tested for osteoporosis using DXA, and the appropriate treatment considered based on the DXA result Thom Hall, MD [facility] Electronically Signed   By: Thom Hall M.D.   On: 05/04/2024 18:07   IR KYPHO EA ADDL LEVEL THORACIC OR LUMBAR Result Date: 05/04/2024 CLINICAL DATA:  Traumatic compression fracture of T4 thoracic vertebra closed initial encounter; Traumatic compression fracture of T5 thoracic vertebra closed initial encounter Briefly, 74 year old female with a history of metastatic RIGHT lung cancer with atraumatic adjacent T4 and T5 vertebral body fractures. Significant pain, failed conservative management. EXAM: T4 and T5 VERTEBRAL BODY BIOPSIES AND AUGMENTATION WITH BALLOON KYPHOPLASTY COMPARISON:  MR T-SPINE, 04/06/2024. CT CHEST, 03/21/2024. PET-CT, 01/13/2024. MEDICATIONS: As antibiotic prophylaxis, Ancef  2 gm IV was ordered pre-procedure and administered intravenously within 1 hour of incision. 4 MG ZOFRAN  IV.  50 MG BENADRYL  IV. ANESTHESIA/SEDATION: Moderate (conscious) sedation was employed during this procedure. A total of Versed  4 mg and Fentanyl  200 mcg was administered intravenously. Moderate Sedation Time: 104 minutes. The patient's level of consciousness and vital signs were monitored continuously by radiology nursing throughout the procedure under my direct supervision.  FLUOROSCOPY: Radiation Exposure Index and estimated peak skin dose (PSD); Reference air kerma (RAK), 1526 mGy. COMPLICATIONS: None immediate. PROCEDURE: The procedure, risks (including but not limited to bleeding, infection, organ damage), benefits, and alternatives were explained to the patient and/or patient's representative. Questions regarding the procedure were encouraged and answered. The patient understands and consents to the procedure. The patient was placed prone on the fluoroscopic table. The skin overlying the lower lumbar spine region was then prepped and draped in the usual sterile fashion. Maximal barrier sterile technique was utilized including caps, mask, sterile gowns, sterile gloves, sterile drape, hand hygiene and skin antiseptic. IV fentanyl  and versed  were administered as conscious sedation during continuous cardiorespiratory monitoring by the radiology RN. The procedure began at T4 vertebral body; The LEFT pedicle at T4 was then infiltrated with 0.5% Marcaine  followed by the advancement of a Kyphon trocar needle through the pedicle into the posterior one-third of the vertebral body. The osteo drill was advanced to the anterior third of the vertebral body. The osteo drill was retracted. Through the working cannula, a Kyphon inflatable bone tamp 15 x 3 was advanced and positioned with the distal marker approximately 5 mm from the anterior aspect of the cortex. Appropriate positioning was confirmed on the AP projection. At this time, the balloon was expanded using contrast via a Kyphon inflation syringe device via micro tubing. In similar fashion, the RIGHT T4 pedicle was infiltrated with 0.5% Marcaine  followed by the advancement of a second Kyphon trocar needle through the right pedicle into the posterior third of the vertebral body. Subsequently, the osteo drill was coaxially advanced to the anterior right third. The osteo drill was exchanged for a Kyphon inflatable bone tamp 15 x 3, advanced  to the 5  mm of the anterior aspect of the cortex. The balloon was then expanded using contrast as above. The procedure was repeated identically at T5 vertebral body, allowing for bone biopsy and bipedicular cannulae. Simultaneous Inflations were continued until there was near apposition with the superior end plate. At this time, methylmethacrylate mixture was reconstituted in the Kyphon bone mixing device system. This was then loaded into the delivery mechanism, attached to Kyphon bone fillers. The balloons were deflated and removed followed by the instillation of methylmethacrylate mixture with excellent filling in the AP and lateral projections. No extravasation was noted in the disk spaces or posteriorly into the spinal canal. No epidural venous contamination was seen. The working cannulae and the bone filler were then retrieved and removed. Hemostasis was achieved with manual compression. The patient tolerated the procedure well without immediate postprocedural complication. Findings; FINDINGS: *Bone biopsy performed at both T4 and T5 vertebral bodies. *Adequate cement filling of the T4 and T5 vertebral bodies on both the AP and lateral projections. *T4 inferior endplate fracture with noted cement extravasation into the T4-T5 intervertebral disc space and RIGHT epidural veins. *No extravasation was noted posteriorly into the spinal canal or along the pedicular access. No T5 epidural venous contamination was seen. *The patient has suffered a fracture of the T4 and T5. It is recommended that patients aged 51 years or older be evaluated for possible testing or treatment of osteoporosis. A copy of this procedure report is sent to the patient's referring physician IMPRESSION: 1. Successful T4 and T5 vertebral body biopsies and cement augmentation with balloon kyphoplasty. 2. T4 inferior endplate fracture with small volume cement extravasation into the T4-T5 disc space, and RIGHT T4 epidural veins. 3. No posterior  extravasation into the spinal canal, T5 epidural venous contamination, or along the pedicular access. Per CMS PQRS reporting requirements (PQRS Measure 24): Given the patient's age of greater than 50 and the fracture site (hip, distal radius, or spine), the patient should be tested for osteoporosis using DXA, and the appropriate treatment considered based on the DXA result Thom Hall, MD [facility] Electronically Signed   By: Thom Hall M.D.   On: 05/04/2024 18:07     Assessment and plan- Patient is a 74 y.o. female with history of stage I right upper lobe lung cancer s/p SBRT in the past. She had biopsy-proven mediastinal recurrence in December 2024.  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 Indeterminate situation requiring multidisciplinary review. Further tissue diagnosis may be necessary if progression is confirmed. - Continued Tagrisso  40 mg daily. - Scheduled review of PET scan findings with radiologists and tumor board next Thursday.  Overall I am concerned that patient is having disease progression and needs change of treatment. - Planned multidisciplinary discussion with pulmonology and radiology if repeat biopsy is an option - Deferred further appointments until after tumor board review. - If overall consensus is that we need to consider this as progression without biopsy based on radiological appearance I will plan to repeat NGS testing from her peripheral blood again to see if there is any evidence of met amplification and if she would be a candidate for tepotinib in addition to Tagrisso .  If she does not  have that mutation and I will  consider offering her carboplatin  and Alimta with or without Amivantamab  Chronic cough Persistent dry cough. No acute infection on PET scan.  Could be secondary to disease progression - Continued symptomatic management with cough drops and promethazine  as needed. - Scheduled follow-up with pulmonologist (Dr. Fernand) next Monday.  Lumbar compression fracture, post-surgical Status post surgical intervention with negative bone biopsy for malignancy. Intermittent sharp pain at the site, typically requiring oxycodone  5 mg once daily.  - Continued oxycodone  5 mg as needed for pain.         Visit Diagnosis 1. High risk medication use   2. Recurrent non-small cell lung cancer (NSCLC) (HCC)      Dr. Annah Skene, MD, MPH [facility] [phone] 06/02/2024 6:11 PM                   [1]  Allergies Allergen Reactions   Adhesive [Tape] Other (See Comments)    Pt reports, when removing tape from skin most tapes pull her skin off. Ok to use paper tape   Avapro  [Irbesartan ] Anaphylaxis    Facial swelling   Morphine  And Codeine  Nausea And Vomiting   Other Hives    Chlorhexadine wipes/CHG wipes,    Latex Rash    Exam gloves/ does not react around elastic and lips don't swell when blowing balloons  [2]  Current Outpatient Medications:    albuterol  (VENTOLIN  HFA) 108 (90 Base) MCG/ACT inhaler, Inhale 2 puffs into the lungs every 6 (six) hours as needed for wheezing or shortness of breath., Disp: 18 g, Rfl: 3   bisacodyl  (DULCOLAX) 10 MG suppository, Place 1 suppository (10 mg total) rectally as needed for moderate constipation., Disp: 12 suppository, Rfl: 0   budesonide -formoterol  (SYMBICORT ) 80-4.5 MCG/ACT inhaler, Inhale 2 puffs into the lungs 2 (two) times daily., Disp: 10.2 g, Rfl: 3   candesartan (ATACAND) 16 MG tablet, Take 16 mg by mouth in the morning., Disp: , Rfl:    cholecalciferol  (VITAMIN D ) 25 MCG (1000 UNIT) tablet, Take 1,000 Units by  mouth in the morning., Disp: , Rfl:    Cyanocobalamin  (VITAMIN B-12) 5000 MCG TBDP, Take 5,000 mcg by mouth in the morning., Disp: , Rfl:    cyclobenzaprine  (FLEXERIL ) 10 MG tablet, Take 10 mg by mouth at bedtime., Disp: , Rfl:    Dulaglutide (TRULICITY) 1.5 MG/0.5ML SOAJ, Inject 1.5 mg into the skin once a week. Saturday, Disp: , Rfl:    gabapentin  (NEURONTIN ) 100 MG capsule, Take 200 mg by mouth with breakfast, with lunch, and with evening meal., Disp: , Rfl:    gabapentin  (NEURONTIN ) 300 MG capsule, Take 300 mg by mouth at bedtime., Disp: , Rfl:    glipiZIDE  (GLUCOTROL ) 10 MG tablet, Take 10 mg by mouth daily before breakfast., Disp: , Rfl:    HYDROcodone  bit-homatropine (HYCODAN) 5-1.5 MG/5ML syrup, Take 5 mLs by mouth every 8 (eight) hours as needed for cough., Disp: 120 mL, Rfl: 0   LORazepam  (ATIVAN ) 0.5 MG tablet, Take 1 tablet by mouth 30 minutes prior to MRI scan. May take additional tablet if needed., Disp: 2 tablet, Rfl: 0   Magnesium  Oxide -Mg Supplement 500 MG TABS, Take 1 tablet by mouth at bedtime., Disp: , Rfl:    mometasone -formoterol  (DULERA) 100-5 MCG/ACT AERO, Inhale 2 puffs into the lungs 2 (two) times daily., Disp: 1 each, Rfl: 3   omeprazole  (PRILOSEC) 20 MG capsule, Take 1 capsule (20 mg total) by mouth  daily., Disp: 90 capsule, Rfl: 0   ondansetron  (ZOFRAN ) 8 MG tablet, TAKE 1 TABLET BY MOUTH EVERY 8 HOURS AS NEEDED FOR NAUSEA OR VOMITING. START ON THE 3RD DAY AFTER CHEMOTHERAPY, Disp: 30 tablet, Rfl: 1   ONETOUCH ULTRA TEST test strip, , Disp: , Rfl:    osimertinib  mesylate (TAGRISSO ) 40 MG tablet, Take 1 tablet (40 mg total) by mouth daily., Disp: 30 tablet, Rfl: 3   oxybutynin  (DITROPAN ) 5 MG tablet, Take 1 tablet by mouth 2 (two) times daily., Disp: , Rfl:    oxyCODONE  (OXY IR/ROXICODONE ) 5 MG immediate release tablet, Take 1 tablet (5 mg total) by mouth every 6 (six) hours as needed for severe pain (pain score 7-10)., Disp: 90 tablet, Rfl: 0   OXYGEN , Inhale 2 L into  the lungs., Disp: , Rfl:    prochlorperazine  (COMPAZINE ) 10 MG tablet, Take 1 tablet (10 mg total) by mouth every 6 (six) hours as needed for nausea or vomiting., Disp: 90 tablet, Rfl: 0   rOPINIRole  (REQUIP ) 1 MG tablet, Take 1 mg by mouth at bedtime., Disp: , Rfl:    sucralfate  (CARAFATE ) 1 g tablet, Take 1 tablet (1 g total) by mouth 3 (three) times daily with meals. Dissolve tablet in 4 tablesppons warm water swish and swallow 30 min before meals. (Patient taking differently: Take 1 g by mouth 3 (three) times daily with meals. Dissolve tablet in 4 tablespoons warm water swish and swallow 30 min before meals.), Disp: 90 tablet, Rfl: 0   warfarin (COUMADIN ) 5 MG tablet, Take 5 mg by mouth daily., Disp: , Rfl:   "

## 2024-06-04 ENCOUNTER — Ambulatory Visit: Admitting: Internal Medicine

## 2024-06-04 ENCOUNTER — Encounter: Payer: Self-pay | Admitting: Internal Medicine

## 2024-06-04 VITALS — BP 125/77 | HR 98 | Temp 98.0°F | Resp 16 | Ht 63.0 in | Wt 161.4 lb

## 2024-06-04 DIAGNOSIS — Z9981 Dependence on supplemental oxygen: Secondary | ICD-10-CM | POA: Diagnosis not present

## 2024-06-04 DIAGNOSIS — C3411 Malignant neoplasm of upper lobe, right bronchus or lung: Secondary | ICD-10-CM

## 2024-06-04 DIAGNOSIS — J9611 Chronic respiratory failure with hypoxia: Secondary | ICD-10-CM

## 2024-06-04 DIAGNOSIS — J4489 Other specified chronic obstructive pulmonary disease: Secondary | ICD-10-CM | POA: Diagnosis not present

## 2024-06-04 NOTE — Patient Instructions (Signed)
 Chronic Obstructive Pulmonary Disease  Chronic obstructive pulmonary disease (COPD) is a long-term (chronic) lung problem. When you have COPD, it can feel harder to breathe in or out. The condition may get worse over time. There are things you can do to keep yourself as healthy as possible. What are the causes? Smoking. This is the most common cause. Breathing in fumes, smoke, or chemicals for a long time. Genes that are inherited, which means they are passed down from parent to child. What are the signs or symptoms? Shortness of breath. This may happen all the time. This may get worse when you move your body. This may get worse over time. You may have times when this becomes much worse all of a sudden. These are called flare-ups or exacerbations. A long-term cough, with or without thick mucus. Wheezing. Chest tightness. Feeling tired. Not being able to do activities like you used to do. How is this diagnosed? This condition is diagnosed based on: Your medical history. A physical exam. Lung (pulmonary) function tests. You may have a test that measures the air flow out of the lungs when you breathe out. You may also have tests, including: Chest X-ray. CT scan. Blood tests. How is this treated? This condition may be treated by: Quitting smoking, if you smoke. Using oxygen . Taking medicines. These may include: Inhalers. These have medicines in them that you breathe in. Daily inhalers. These help to prevent symptoms from happening. They are usually taken every day to prevent COPD flare-ups. Quick relief inhalers. These act fast to relieve symptoms. They are used only when needed and provide short-term relief. Other medicines that you breathe in or swallow. These may be used to open the airways, thin mucus, or treat infections. Breathing exercises to help you control or catch your breath. A mucus clearing device, if you have a lot of thick mucus. Pulmonary rehab. A place where you  will learn about your condition and the best ways for you to manage it. Surgery. Follow these instructions at home: Medicines Take your medicines as told by your health care provider. Talk to your provider before taking any cough or allergy medicines. You may need to avoid medicines that cause your lungs to be dry. Lifestyle Several times a day, wash your hands with soap and water for at least 20 seconds. If you cannot use soap and water, use hand sanitizer. This may help keep you from getting an infection. Avoid being around crowds or people who are sick. Do not smoke or use any products that contain nicotine or tobacco. If you need help quitting, ask your provider. Stay active. Learn how to pace your activity during the day. Learn how to breathe to control your stress and catch your breath. Drink enough fluid to keep your pee (urine) pale yellow, unless you have been told not to. Eat healthy foods. Eat smaller meals more often. Get enough sleep. Most adults need 7 or more hours per night. General instructions Make a COPD action plan with your provider. This helps you to know what to do if you feel worse than usual. Make sure you get all the shots, also called vaccines, that your provider recommends. Ask your provider about a flu shot and a pneumonia shot. If you need home oxygen  therapy, ask your provider how often to check your oxygen  level with a device called an oximeter. Keep all follow-up visits to review your COPD action plan. Your provider will want to check on your condition often to keep  you healthy and out of the hospital. Contact a health care provider if: You are coughing up more mucus than usual. There is a change in the color or thickness of the mucus. It is harder to breathe than usual or you are short of breath while you are resting. You need to use your quick relief inhaler more often. You have trouble doing your normal activities such as getting dressed or walking in  the house. Your skin color or fingernails turn blue. You have a fever or chills. Get help right away if: You are short of breath and cannot: Talk in full sentences. Do normal activities. You have chest pain. You feel confused. These symptoms may be an emergency. Call 911 right away. Do not wait to see if the symptoms will go away. Do not drive yourself to the hospital. This information is not intended to replace advice given to you by your health care provider. Make sure you discuss any questions you have with your health care provider. Document Revised: 02/10/2023 Document Reviewed: 07/26/2022 Elsevier Patient Education  2024 [employer].

## 2024-06-04 NOTE — Progress Notes (Unsigned)
 [facility] [address] [place], [place] [address]  Pulmonary Sleep Medicine   Office Visit Note  Patient Name: Kara [name] DOB: 1950/10/11 MRN [id number]  Date of Service: 06/04/2024  Complaints/HPI: She had a PET scan which showed increased uptake and was seen by Dr Melanee. I did review the scan with the patient so she gets a better idea of what is going on with the tumor. She was to be discussed with the tumor board for further evaluation suggestions. She is to get further testing also per oncology. Her pulmonary status is about the same. She continues to be on oxygen  as prescribed. Patient states oxygen  seems to help. She does have cough which has been chronic. She takes promethazine  which did help for a little bit  Office Spirometry Results:     ROS  General: (-) fever, (-) chills, (-) night sweats, (-) weakness Skin: (-) rashes, (-) itching,. Eyes: (-) visual changes, (-) redness, (-) itching. Nose and Sinuses: (-) nasal stuffiness or itchiness, (-) postnasal drip, (-) nosebleeds, (-) sinus trouble. Mouth and Throat: (-) sore throat, (-) hoarseness. Neck: (-) swollen glands, (-) enlarged thyroid , (-) neck pain. Respiratory: + cough, (-) bloody sputum, + shortness of breath, - wheezing. Cardiovascular: - ankle swelling, (-) chest pain. Lymphatic: (-) lymph node enlargement. Neurologic: (-) numbness, (-) tingling. Psychiatric: (-) anxiety, (-) depression   Current Medication: Outpatient Encounter Medications as of 06/04/2024  Medication Sig Note   albuterol  (VENTOLIN  HFA) 108 (90 Base) MCG/ACT inhaler Inhale 2 puffs into the lungs every 6 (six) hours as needed for wheezing or shortness of breath.    bisacodyl  (DULCOLAX) 10 MG suppository Place 1 suppository (10 mg total) rectally as needed for moderate constipation. 08/02/2023: prn   budesonide -formoterol  (SYMBICORT ) 80-4.5 MCG/ACT inhaler Inhale 2 puffs into the lungs 2 (two) times daily.    candesartan  (ATACAND) 16 MG tablet Take 16 mg by mouth in the morning.    cholecalciferol  (VITAMIN D ) 25 MCG (1000 UNIT) tablet Take 1,000 Units by mouth in the morning.    Cyanocobalamin  (VITAMIN B-12) 5000 MCG TBDP Take 5,000 mcg by mouth in the morning.    cyclobenzaprine  (FLEXERIL ) 10 MG tablet Take 10 mg by mouth at bedtime.    Dulaglutide (TRULICITY) 1.5 MG/0.5ML SOAJ Inject 1.5 mg into the skin once a week. Saturday 08/08/2023: Pt takes on saturday   gabapentin  (NEURONTIN ) 100 MG capsule Take 200 mg by mouth with breakfast, with lunch, and with evening meal.    gabapentin  (NEURONTIN ) 300 MG capsule Take 300 mg by mouth at bedtime.    glipiZIDE  (GLUCOTROL ) 10 MG tablet Take 10 mg by mouth daily before breakfast.    HYDROcodone  bit-homatropine (HYCODAN) 5-1.5 MG/5ML syrup Take 5 mLs by mouth every 8 (eight) hours as needed for cough.    LORazepam  (ATIVAN ) 0.5 MG tablet Take 1 tablet by mouth 30 minutes prior to MRI scan. May take additional tablet if needed.    Magnesium  Oxide -Mg Supplement 500 MG TABS Take 1 tablet by mouth at bedtime.    mometasone -formoterol  (DULERA) 100-5 MCG/ACT AERO Inhale 2 puffs into the lungs 2 (two) times daily.    omeprazole  (PRILOSEC) 20 MG capsule Take 1 capsule (20 mg total) by mouth daily.    ondansetron  (ZOFRAN ) 8 MG tablet TAKE 1 TABLET BY MOUTH EVERY 8 HOURS AS NEEDED FOR NAUSEA OR VOMITING. START ON THE 3RD DAY AFTER CHEMOTHERAPY    ONETOUCH ULTRA TEST test strip     osimertinib  mesylate (  TAGRISSO ) 40 MG tablet Take 1 tablet (40 mg total) by mouth daily.    oxybutynin  (DITROPAN ) 5 MG tablet Take 1 tablet by mouth 2 (two) times daily.    oxyCODONE  (OXY IR/ROXICODONE ) 5 MG immediate release tablet Take 1 tablet (5 mg total) by mouth every 6 (six) hours as needed for severe pain (pain score 7-10).    OXYGEN  Inhale 2 L into the lungs.    prochlorperazine  (COMPAZINE ) 10 MG tablet Take 1 tablet (10 mg total) by mouth every 6 (six) hours as needed for nausea or vomiting.     rOPINIRole  (REQUIP ) 1 MG tablet Take 1 mg by mouth at bedtime.    sucralfate  (CARAFATE ) 1 g tablet Take 1 tablet (1 g total) by mouth 3 (three) times daily with meals. Dissolve tablet in 4 tablesppons warm water swish and swallow 30 min before meals. (Patient taking differently: Take 1 g by mouth 3 (three) times daily with meals. Dissolve tablet in 4 tablespoons warm water swish and swallow 30 min before meals.)    warfarin (COUMADIN ) 5 MG tablet Take 5 mg by mouth daily. 12/08/2023: Pt confirmed now taking 5mg  every morning   No facility-administered encounter medications on file as of 06/04/2024.    Surgical History: Past Surgical History:  Procedure Laterality Date   APPENDECTOMY N/A 02/24/2017   Procedure: Incidental  APPENDECTOMY;  Surgeon: Shelva Dunnings, MD;  Location: [name];  Service: General;  Laterality: N/A;   CARPAL TUNNEL RELEASE Bilateral    CATARACT EXTRACTION W/PHACO Left 11/21/2018   Procedure: CATARACT EXTRACTION PHACO AND INTRAOCULAR LENS PLACEMENT (IOC) LEFT DIABETES;  Surgeon: Jaye Fallow, MD;  Location: [facility];  Service: Ophthalmology;  Laterality: Left;  latex sensitivity Diabetic - oral meds   CATARACT EXTRACTION W/PHACO Right 12/12/2018   Procedure: CATARACT EXTRACTION PHACO AND INTRAOCULAR LENS PLACEMENT (IOC) RIGHT DIABETES;  Surgeon: Jaye Fallow, MD;  Location: [facility];  Service: Ophthalmology;  Laterality: Right;  Diabetes-oral med Latex sensitiviy   COLON RESECTION SIGMOID N/A 11/02/2016   Procedure: COLON RESECTION SIGMOID;  Surgeon: Shelva Dunnings, MD;  Location: [name];  Service: General;  Laterality: N/A;   COLON SURGERY  11/02/2016   COLONOSCOPY WITH PROPOFOL  N/A 02/03/2017   Procedure: COLONOSCOPY WITH PROPOFOL ;  Surgeon: Unk Corinn Skiff, MD;  Location: [name];  Service: Gastroenterology;  Laterality: N/A;   COLOSTOMY N/A 11/02/2016   Procedure: COLOSTOMY;  Surgeon: Shelva Dunnings, MD;  Location:  [name];  Service: General;  Laterality: N/A;   COLOSTOMY REVERSAL N/A 02/24/2017   Procedure: COLOSTOMY REVERSAL, ostomy takedown, spleenic flexure mobilization, excision rectal stump/distal sigmoid, anastomosis with suture reinforcement;  Surgeon: Shelva Dunnings, MD;  Location: [name];  Service: General;  Laterality: N/A;   ENDOBRONCHIAL ULTRASOUND N/A 05/16/2023   Procedure: ENDOBRONCHIAL ULTRASOUND;  Surgeon: Tamea Dedra CROME, MD;  Location: [name];  Service: Cardiopulmonary;  Laterality: N/A;   FLEXIBLE BRONCHOSCOPY N/A 05/16/2023   Procedure: FLEXIBLE BRONCHOSCOPY;  Surgeon: Tamea Dedra CROME, MD;  Location: [name];  Service: Cardiopulmonary;  Laterality: N/A;   ILEO LOOP DIVERSION N/A 02/24/2017   Procedure: ILEO LOOP COLOSTOMY;  Surgeon: Shelva Dunnings, MD;  Location: [name];  Service: General;  Laterality: N/A;   ILEOSTOMY CLOSURE N/A 06/01/2017   Procedure: LOOP ILEOSTOMY TAKEDOWN;  Surgeon: Shelva Dunnings, MD;  Location: [name];  Service: General;  Laterality: N/A;   IR IMAGING GUIDED PORT INSERTION  06/14/2023   IR KYPHO EA ADDL LEVEL THORACIC OR LUMBAR  05/04/2024   IR KYPHO  THORACIC WITH BONE BIOPSY  05/04/2024   IR RADIOLOGIST EVAL & MGMT  04/16/2024   IR REMOVAL TUN ACCESS W/ PORT W/O FL MOD SED  11/16/2023   IVC FILTER INSERTION Right 10/2016   IVC FILTER REMOVAL N/A 08/09/2017   Procedure: IVC FILTER REMOVAL;  Surgeon: Jama Cordella MATSU, MD;  Location: [name];  Service: Cardiovascular;  Laterality: N/A;   KNEE SURGERY Right    torn menicus   LAPAROSCOPY N/A 02/24/2017   Procedure: LAPAROSCOPY DIAGNOSTIC;  Surgeon: Shelva Dunnings, MD;  Location: [name];  Service: General;  Laterality: N/A;   LYSIS OF ADHESION N/A 02/24/2017   Procedure: LYSIS OF ADHESION;  Surgeon: Shelva Dunnings, MD;  Location: [name];  Service: General;  Laterality: N/A;   PULMONARY VENOGRAPHY N/A 11/19/2016   Procedure: Pulmonary Venography; IVC filter placement;  possible pulmonary thrombectomy;  Surgeon: Jama Cordella MATSU, MD;  Location: [name];  Service: Cardiovascular;  Laterality: N/A;   SACROPLASTY N/A 11/08/2017   Procedure: SACROPLASTY S2;  Surgeon: Kathlynn Sharper, MD;  Location: [name];  Service: Orthopedics;  Laterality: N/A;   SIGMOIDOSCOPY N/A 11/13/2016   Procedure: endoscopic  flexible SIGMOIDOSCOPY;  Surgeon: Wonda Charlie BRAVO, MD;  Location: [name];  Service: General;  Laterality: N/A;   SIGMOIDOSCOPY N/A 05/19/2017   Procedure: ENID MORIN;  Surgeon: Shelva Dunnings, MD;  Location: [name];  Service: General;  Laterality: N/A;   VIDEO BRONCHOSCOPY WITH ENDOBRONCHIAL NAVIGATION N/A 02/23/2021   Procedure: ROBOTIC VIDEO BRONCHOSCOPY WITH ENDOBRONCHIAL NAVIGATION;  Surgeon: Tamea Dedra CROME, MD;  Location: [name];  Service: Pulmonary;  Laterality: N/A;    Medical History: Past Medical History:  Diagnosis Date   Anginal pain    Arthritis    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, 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   CHF (congestive heart failure) (HCC)    followed colon resection, wouldn't let me get up   Chronic anticoagulation    Degenerative disc disease, lumbar    Diverticulitis 2018   Diverticulosis    DVT of lower extremity, bilateral (HCC) 2018   Essential hypertension    History of being hospitalized    1 month ago for 3 days, vomiting, and kink in upper intestine   History of hiatal hernia    Mediastinal adenopathy 04/2023   MRSA nasal colonization    Non-small cell lung cancer (HCC) 2022   right   Obesity (BMI 30-39.9)    Osteoarthritis    Osteoporosis    PE (pulmonary thromboembolism) (HCC) 2018   Pneumonia    Small bowel obstruction (HCC) 03/20/2023   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HCC)    Type 2 diabetes mellitus with obesity    Vertigo     Family History: Family History  Problem Relation Age of Onset   Diabetes Mother    Heart disease Mother    Cancer Mother    Breast cancer  Mother        >50   Heart disease Father    Diabetes Sister    Heart disease Sister     Social History: Social History   Socioeconomic History   Marital status: Married    Spouse name: Butler   Number of children: 1   Years of education: Not on file   Highest education level: Not on file  Occupational History   Not on file  Tobacco Use   Smoking status: Never    Passive exposure: Current (husband smokes)   Smokeless tobacco: Never  Vaping  Use   Vaping status: Never Used  Substance and Sexual Activity   Alcohol  use: No    Alcohol /week: 0.0 standard drinks of alcohol    Drug use: No   Sexual activity: Never  Other Topics Concern   Not on file  Social History Narrative   Not on file   Social Drivers of Health   Tobacco Use: Medium Risk (06/04/2024)   Patient History    Smoking Tobacco Use: Never    Smokeless Tobacco Use: Never    Passive Exposure: Current  Financial Resource Strain: Low Risk  (05/18/2024)   Received from [facility]   Overall Financial Resource Strain (CARDIA)    Difficulty of Paying Living Expenses: Not hard at all  Food Insecurity: No Food Insecurity (05/18/2024)   Received from [facility]   Epic    Within the past 12 months, you worried that your food would run out before you got the money to buy more.: Never true    Within the past 12 months, the food you bought just didn't last and you didn't have money to get more.: Never true  Transportation Needs: No Transportation Needs (05/18/2024)   Received from [facility] - Transportation    In the past 12 months, has lack of transportation kept you from medical appointments or from getting medications?: No    Lack of Transportation (Non-Medical): No  Physical Activity: Not on file  Stress: Not on file  Social Connections: Moderately Isolated (12/12/2023)   Social Connection and Isolation Panel    Frequency of Communication with Friends  and Family: More than three times a week    Frequency of Social Gatherings with Friends and Family: Once a week    Attends Religious Services: Never    Database Administrator or Organizations: No    Attends Banker Meetings: Never    Marital Status: Married  Catering Manager Violence: Not At Risk (12/12/2023)   Epic    Fear of Current or Ex-Partner: No    Emotionally Abused: No    Physically Abused: No    Sexually Abused: No  Depression (PHQ2-9): Low Risk (04/12/2024)   Depression (PHQ2-9)    PHQ-2 Score: 2  Alcohol  Screen: Not on file  Housing: Low Risk  (05/18/2024)   Received from [facility]   Epic    In the last 12 months, was there a time when you were not able to pay the mortgage or rent on time?: No    In the past 12 months, how many times have you moved where you were living?: 0    At any time in the past 12 months, were you homeless or living in a shelter (including now)?: No  Utilities: Not At Risk (05/18/2024)   Received from [facility]   Epic    In the past 12 months has the electric, gas, oil, or water company threatened to shut off services in your home?: No  Health Literacy: Not on file    Vital Signs: Blood pressure 125/77, pulse 98, temperature 98 F (36.7 C), resp. rate 16, height 5' 3 (1.6 m), weight 161 lb 6.4 oz (73.2 kg), SpO2 93%.  Examination: General Appearance: The patient is well-developed, well-nourished, and in no distress. Skin: Gross inspection of skin unremarkable. Head: normocephalic, no gross deformities. Eyes: no gross deformities noted. ENT: ears appear grossly normal no exudates. Neck: Supple. No thyromegaly. No LAD. Respiratory: no  rhonchi noted. Cardiovascular: Normal S1 and S2 without murmur or rub. Extremities: No cyanosis. pulses are equal. Neurologic: Alert and oriented. No involuntary movements.  LABS: Recent Results (from the past 2160 hours)  CBC with Differential (Cancer  Center Only)     Status: Abnormal   Collection Time: 03/09/24  9:24 AM  Result Value Ref Range   WBC Count 5.2 4.0 - 10.5 K/uL   RBC 3.96 3.87 - 5.11 MIL/uL   Hemoglobin 11.8 (L) 12.0 - 15.0 g/dL   HCT 64.1 (L) 63.9 - 53.9 %   MCV 90.4 80.0 - 100.0 fL   MCH 29.8 26.0 - 34.0 pg   MCHC 33.0 30.0 - 36.0 g/dL   RDW 86.2 88.4 - 84.4 %   Platelet Count 162 150 - 400 K/uL   nRBC 0.0 0.0 - 0.2 %   Neutrophils Relative % 85 %   Neutro Abs 4.4 1.7 - 7.7 K/uL   Lymphocytes Relative 10 %   Lymphs Abs 0.5 (L) 0.7 - 4.0 K/uL   Monocytes Relative 5 %   Monocytes Absolute 0.3 0.1 - 1.0 K/uL   Eosinophils Relative 0 %   Eosinophils Absolute 0.0 0.0 - 0.5 K/uL   Basophils Relative 0 %   Basophils Absolute 0.0 0.0 - 0.1 K/uL   Immature Granulocytes 0 %   Abs Immature Granulocytes 0.02 0.00 - 0.07 K/uL    Comment: Performed at [facility], [address] [place], [place] [address]  CMP (Cancer Center only)     Status: Abnormal   Collection Time: 03/09/24  9:24 AM  Result Value Ref Range   Sodium 137 135 - 145 mmol/L   Potassium 4.4 3.5 - 5.1 mmol/L   Chloride 104 98 - 111 mmol/L   CO2 25 22 - 32 mmol/L   Glucose, Bld 243 (H) 70 - 99 mg/dL    Comment: Glucose reference range applies only to samples taken after fasting for at least 8 hours.   BUN 14 8 - 23 mg/dL   Creatinine 9.17 9.55 - 1.00 mg/dL   Calcium  9.0 8.9 - 10.3 mg/dL   Total Protein 8.2 (H) 6.5 - 8.1 g/dL   Albumin 3.2 (L) 3.5 - 5.0 g/dL   AST 19 15 - 41 U/L   ALT 10 0 - 44 U/L   Alkaline Phosphatase 67 38 - 126 U/L   Total Bilirubin 0.3 0.0 - 1.2 mg/dL   GFR, Estimated >39 >39 mL/min    Comment: (NOTE) Calculated using the CKD-EPI Creatinine Equation (2021)    Anion gap 8 5 - 15    Comment: Performed at [facility], [address] [place], [place] [address]  Pulmonary Function Test     Status: None   Collection Time: 03/14/24  3:12 PM  Result Value Ref Range   FEV1     FVC     FEV1/FVC     TLC     DLCO     Surgical pathology     Status: None   Collection Time: 05/04/24 12:00 AM  Result Value Ref Range   SURGICAL PATHOLOGY      SURGICAL PATHOLOGY [facility] [address] [place], [place] [address] Telephone [phone] or [phone] Fax [phone]  REPORT OF SURGICAL PATHOLOGY   Accession #: [id number] Patient Name: Kara [name], Kara [name] Visit # : [id number]  MRN: [id number] Physician: Hughes Simmonds DOB/Age January 03, 1951 (Age: 6) Gender: F Collected Date: 05/04/2024 Received Date: 05/04/2024  FINAL DIAGNOSIS  1. Bone, biopsy, T4 :       -  FRAGMENTS OF BONE WITH BONY REMODELING CONSISTENT WITH FRACTURE.       2. Bone, biopsy, T5 :       -  FRAGMENTS OF BONE WITH BONY REMODELING AND MARROW FIBROSIS CONSISTENT WITH      FRACTURE.      NOTE: THERE IS NO MORPHOLOGIC OR IMMUNOHISTOCHEMICAL EVIDENCE OF METASTATIC      DISEASE IN EITHER SPECIMEN (CK AE1/AE3 AND TTF-1 PERFORMED ON BOTH SPECIMENS ARE      NEGATIVE).       ELECTRONIC SIGNATURE : Legolvan Do, Mark, Pathologist, Electronic Signature  MICROSCOPIC DESCRIPTION  CASE COMMENTS STAINS U SED IN DIAGNOSIS: H&E Universal Negative Control-DAB H&E Stains used in diagnosis 1 CK AE1AE3, 1 Thyroid  Transcription Factor -1, 2 CK AE1AE3, 2 Thyroid  Transcription Factor -1 Some of these immunohistochemical stains may have been developed and the performance characteristics determined by [facility].  Some may not have been cleared or approved by the U.S. Food and Drug Administration.  The FDA has determined that such clearance or approval is not necessary.  This test is used for clinical purposes.  It should not be regarded as investigational or for research.  This laboratory is certified under the Clinical Laboratory Improvement Amendments of 1988 (CLIA-88) as qualified to perform high complexity clinical laboratory testing.    CLINICAL HISTORY  SPECIMEN(S) OBTAINED 1.  Bone, biopsy, T4 2. Bone, biopsy, T5  SPECIMEN COMMENTS: SPECIMEN CLINICAL INFORMATION: 1. 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 Received in formalin is  a 0.5 cm long, 0.2 cm diameter core of tan bone submitted entirely in 1 block after immunocal decalcification. 2. Received in formalin is a 0.5 cm long, 0.2 cm diameter core of tan bone submitted entirely in 1 block after immunocal decalcification.mb 05-04-24        Report signed out from the following location(s) [facility] [address] [place], [place] [address] CLIA #: [id number]  [facility] [address] [place], [place] [address] CLIA #: [id number]   Protime-INR     Status: None   Collection Time: 05/04/24  8:45 AM  Result Value Ref Range   Prothrombin  Time 15.0 11.4 - 15.2 seconds   INR 1.1 0.8 - 1.2    Comment: (NOTE) INR goal varies based on device and disease states. Performed at [facility], [address] [place], [place] [address]   Basic metabolic panel     Status: Abnormal   Collection Time: 05/04/24  8:45 AM  Result Value Ref Range   Sodium 142 135 - 145 mmol/L   Potassium 3.9 3.5 - 5.1 mmol/L   Chloride 104 98 - 111 mmol/L   CO2 27 22 - 32 mmol/L   Glucose, Bld 143 (H) 70 - 99 mg/dL    Comment: Glucose reference range applies only to samples taken after fasting for at least 8 hours.   BUN 13 8 - 23 mg/dL   Creatinine, Ser 9.24 0.44 - 1.00 mg/dL   Calcium  9.5 8.9 - 10.3 mg/dL   GFR, Estimated >39 >39 mL/min    Comment: (NOTE) Calculated using the CKD-EPI Creatinine Equation (2021)    Anion gap 11 5 - 15    Comment: Performed at [facility], [address], [place], [place] [address]  CBC with Differential/Platelet     Status: Abnormal  Collection Time: 05/04/24  8:45 AM  Result Value Ref Range   WBC 5.2 4.0 - 10.5 K/uL   RBC 4.15 3.87 - 5.11 MIL/uL   Hemoglobin 12.3 12.0 - 15.0 g/dL   HCT 61.3 63.9 - 53.9 %    MCV 93.0 80.0 - 100.0 fL   MCH 29.6 26.0 - 34.0 pg   MCHC 31.9 30.0 - 36.0 g/dL   RDW 83.8 (H) 88.4 - 84.4 %   Platelets 122 (L) 150 - 400 K/uL   nRBC 0.0 0.0 - 0.2 %   Neutrophils Relative % 71 %   Neutro Abs 3.7 1.7 - 7.7 K/uL   Lymphocytes Relative 17 %   Lymphs Abs 0.9 0.7 - 4.0 K/uL   Monocytes Relative 10 %   Monocytes Absolute 0.5 0.1 - 1.0 K/uL   Eosinophils Relative 2 %   Eosinophils Absolute 0.1 0.0 - 0.5 K/uL   Basophils Relative 0 %   Basophils Absolute 0.0 0.0 - 0.1 K/uL   Immature Granulocytes 0 %   Abs Immature Granulocytes 0.01 0.00 - 0.07 K/uL    Comment: Performed at [facility], [address] [place], [place] [address]  Glucose, capillary     Status: Abnormal   Collection Time: 05/04/24  8:47 AM  Result Value Ref Range   Glucose-Capillary 131 (H) 70 - 99 mg/dL    Comment: Glucose reference range applies only to samples taken after fasting for at least 8 hours.  Glucose, capillary     Status: Abnormal   Collection Time: 05/04/24 12:13 PM  Result Value Ref Range   Glucose-Capillary 133 (H) 70 - 99 mg/dL    Comment: Glucose reference range applies only to samples taken after fasting for at least 8 hours.  Glucose, capillary     Status: Abnormal   Collection Time: 05/14/24  9:58 AM  Result Value Ref Range   Glucose-Capillary 122 (H) 70 - 99 mg/dL    Comment: Glucose reference range applies only to samples taken after fasting for at least 8 hours.  CMP (Cancer Center only)     Status: Abnormal   Collection Time: 05/30/24  9:32 AM  Result Value Ref Range   Sodium 140 135 - 145 mmol/L   Potassium 3.6 3.5 - 5.1 mmol/L   Chloride 104 98 - 111 mmol/L   CO2 25 22 - 32 mmol/L   Glucose, Bld 216 (H) 70 - 99 mg/dL    Comment: Glucose reference range applies only to samples taken after fasting for at least 8 hours.   BUN 9 8 - 23 mg/dL   Creatinine 9.09 9.55 - 1.00 mg/dL   Calcium  9.2 8.9 - 10.3 mg/dL   Total Protein 7.2 6.5 - 8.1 g/dL   Albumin 3.6  3.5 - 5.0 g/dL   AST 15 15 - 41 U/L   ALT <5 0 - 44 U/L   Alkaline Phosphatase 64 38 - 126 U/L   Total Bilirubin 0.3 0.0 - 1.2 mg/dL   GFR, Estimated >39 >39 mL/min    Comment: (NOTE) Calculated using the CKD-EPI Creatinine Equation (2021)    Anion gap 11 5 - 15    Comment: Performed at [facility], [address] [place], [place] [address]  CBC with Differential (Cancer Center Only)     Status: Abnormal   Collection Time: 05/30/24  9:32 AM  Result Value Ref Range   WBC Count 5.2 4.0 - 10.5 K/uL   RBC 3.78 (L) 3.87 - 5.11 MIL/uL  Hemoglobin 11.4 (L) 12.0 - 15.0 g/dL   HCT 64.3 (L) 63.9 - 53.9 %   MCV 94.2 80.0 - 100.0 fL   MCH 30.2 26.0 - 34.0 pg   MCHC 32.0 30.0 - 36.0 g/dL   RDW 84.6 88.4 - 84.4 %   Platelet Count 117 (L) 150 - 400 K/uL   nRBC 0.0 0.0 - 0.2 %   Neutrophils Relative % 80 %   Neutro Abs 4.2 1.7 - 7.7 K/uL   Lymphocytes Relative 11 %   Lymphs Abs 0.6 (L) 0.7 - 4.0 K/uL   Monocytes Relative 8 %   Monocytes Absolute 0.4 0.1 - 1.0 K/uL   Eosinophils Relative 1 %   Eosinophils Absolute 0.1 0.0 - 0.5 K/uL   Basophils Relative 0 %   Basophils Absolute 0.0 0.0 - 0.1 K/uL   Immature Granulocytes 0 %   Abs Immature Granulocytes 0.02 0.00 - 0.07 K/uL    Comment: Performed at [facility], [address] [place], [place] [address]  Miscellaneous test (send-out)     Status: None   Collection Time: 06/01/24 11:00 AM  Result Value Ref Range   Miscellaneous Test TEMPUS    Miscellaneous Test Results Collected by Laboratory     Comment: Performed at [facility], [address], [place], [place] [address]    Radiology: NM PET Image Restag (PS) Skull Base To Thigh Result Date: 05/27/2024 CLINICAL DATA:  Subsequent treatment strategy for lung cancer. EXAM: NUCLEAR MEDICINE PET SKULL BASE TO THIGH TECHNIQUE: 7.7 mCi F-18 FDG was injected intravenously. Full-ring PET imaging was performed from the skull base to thigh after the radiotracer. CT data was  obtained and used for attenuation correction and anatomic localization. Fasting blood glucose: 122 mg/dl COMPARISON:  CT chest 89/70/7974, PET 01/13/2024. FINDINGS: Mediastinal blood pool activity: SUV max 2.5 Liver activity: SUV max NA NECK: 5 mm medial left parotid nodule (6/18), SUV max 2.6. No additional abnormal hypermetabolism. Incidental CT findings: None. CHEST: New hypermetabolism within post radiation collapse/consolidation in the anterior right upper lobe, SUV max 5.5. Hypermetabolic subpleural nodularity and pleural thickening in the lower right hemithorax, SUV max 5.7 inferomedially, new from 01/13/2024. Similar mildly hypermetabolic small subcarinal lymph node measures approximately 8 mm, SUV max 4.2. Incidental CT findings: Atherosclerotic calcification of the aorta, aortic valve and coronary arteries. Heart is enlarged. No pericardial effusion. Extensive post treatment consolidation in the right upper lobe and perihilar right middle and right lower lobes. ABDOMEN/PELVIS: Tiny hypermetabolic right retrocrural lymph node measures 5 mm (6/71), SUV max 2.9. Otherwise, no abnormal hypermetabolism. Incidental CT findings: Gallstones. Midline supraumbilical wide necked ventral hernia contain short segments of small bowel and transverse colon. Small bowel anastomosis in the right hemipelvis. SKELETON: No abnormal hypermetabolism. Incidental CT findings: Bilateral sacral augmentations. Degenerative changes in the spine. Midthoracic vertebral body augmentations. 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 IMPRESSION: 1. New hypermetabolism within post radiation scarring in the right upper lobe with new hypermetabolic subpleural nodularity in the inferior right hemithorax, findings indicative of disease progression. 2. Associated hypermetabolic subcarinal and retrocrural lymph nodes. 3. 5 mm medial left parotid nodule is visualized on PET, worrisome for malignancy. 4. Cholelithiasis. 5.  Midline supraumbilical ventral hernia contains short segments of small bowel and transverse colon. 6. Aortic atherosclerosis (ICD10-I70.0). Coronary artery calcification. Electronically Signed   By: Newell Eke M.D.   On: 05/27/2024 15:58    No results found.  NM PET Image Restag (PS) Skull Base To  Thigh Result Date: 05/27/2024 CLINICAL DATA:  Subsequent treatment strategy for lung cancer. EXAM: NUCLEAR MEDICINE PET SKULL BASE TO THIGH TECHNIQUE: 7.7 mCi F-18 FDG was injected intravenously. Full-ring PET imaging was performed from the skull base to thigh after the radiotracer. CT data was obtained and used for attenuation correction and anatomic localization. Fasting blood glucose: 122 mg/dl COMPARISON:  CT chest 89/70/7974, PET 01/13/2024. FINDINGS: Mediastinal blood pool activity: SUV max 2.5 Liver activity: SUV max NA NECK: 5 mm medial left parotid nodule (6/18), SUV max 2.6. No additional abnormal hypermetabolism. Incidental CT findings: None. CHEST: New hypermetabolism within post radiation collapse/consolidation in the anterior right upper lobe, SUV max 5.5. Hypermetabolic subpleural nodularity and pleural thickening in the lower right hemithorax, SUV max 5.7 inferomedially, new from 01/13/2024. Similar mildly hypermetabolic small subcarinal lymph node measures approximately 8 mm, SUV max 4.2. Incidental CT findings: Atherosclerotic calcification of the aorta, aortic valve and coronary arteries. Heart is enlarged. No pericardial effusion. Extensive post treatment consolidation in the right upper lobe and perihilar right middle and right lower lobes. ABDOMEN/PELVIS: Tiny hypermetabolic right retrocrural lymph node measures 5 mm (6/71), SUV max 2.9. Otherwise, no abnormal hypermetabolism. Incidental CT findings: Gallstones. Midline supraumbilical wide necked ventral hernia contain short segments of small bowel and transverse colon. Small bowel anastomosis in the right hemipelvis. SKELETON: No abnormal  hypermetabolism. Incidental CT findings: Bilateral sacral augmentations. Degenerative changes in the spine. Midthoracic vertebral body augmentations. 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 IMPRESSION: 1. New hypermetabolism within post radiation scarring in the right upper lobe with new hypermetabolic subpleural nodularity in the inferior right hemithorax, findings indicative of disease progression. 2. Associated hypermetabolic subcarinal and retrocrural lymph nodes. 3. 5 mm medial left parotid nodule is visualized on PET, worrisome for malignancy. 4. Cholelithiasis. 5. Midline supraumbilical ventral hernia contains short segments of small bowel and transverse colon. 6. Aortic atherosclerosis (ICD10-I70.0). Coronary artery calcification. Electronically Signed   By: Newell Eke M.D.   On: 05/27/2024 15:58    Assessment and Plan: Patient Active Problem List   Diagnosis Date Noted   Low back pain of over 3 months duration 12/27/2023   Intermittent low back pain 12/27/2023   Intractable low back pain 12/27/2023   Mechanical low back pain 12/27/2023   Multifactorial low back pain 12/27/2023   Hypoxic respiratory failure (HCC) 12/11/2023   Sepsis due to pneumonia (HCC) 12/08/2023   Diabetes mellitus without complication (HCC) 12/08/2023   Sepsis, unspecified organism (HCC) 12/08/2023   Acute diarrhea 12/08/2023   Contusion of elbow and forearm, left, initial encounter 12/08/2023   SIRS (systemic inflammatory response syndrome) (HCC) 08/09/2023   Acute cystitis without hematuria 08/09/2023   Acute GI bleeding 08/02/2023   Lower GI bleed 08/02/2023   Chemotherapy induced neutropenia 07/22/2023   Cancer of upper lobe of right lung (HCC) 05/27/2023   Recurrent non-small cell lung cancer (NSCLC) (HCC) 05/27/2023   Mediastinal adenopathy 05/16/2023   (HFpEF) heart failure with preserved ejection fraction (HCC) 05/09/2023   Piriformis muscle pain (Right) 11/01/2022   Lumbar  facet joint pain 09/02/2022   Greater trochanteric bursitis (Left) 01/28/2022   Other bursitis of hip, not elsewhere classified (gluteus medius bursa) (Left) 01/28/2022   Chronic low back pain (Right) w/o sciatica 01/07/2022   History of recurrent deep vein thrombosis (DVT) 11/21/2021   Statin declined 11/02/2021   Osteoarthritis of hips (Bilateral) 09/15/2021   Greater trochanteric bursitis (Right) 09/15/2021   Chronic hip pain (Right) 09/15/2021   Chronic  hip pain (Bilateral) (R>L) 09/01/2021   Chronic use of opiate for therapeutic purpose 10/22/2020   Metabolic syndrome 08/14/2020   Generalized weakness 08/14/2020   Truncal obesity 08/04/2020   Latex precautions, history of latex allergy 06/24/2020   Hyperglycemia 06/24/2020   Uncomplicated opioid dependence (HCC) 06/15/2020   Spasm of muscle of lower back 01/29/2020   Abnormal MRI, lumbar spine (08/09/2019) 08/14/2019   Spondylolisthesis, lumbosacral region 07/26/2019   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   Chronic sacroiliac joint pain (Left) 02/28/2019   Small bowel obstruction due to adhesions (HCC) 08/24/2018   Coccygodynia 05/09/2018   Acute deep vein thrombosis (DVT) of distal end of right lower extremity (HCC) 05/08/2018   DDD (degenerative disc disease), lumbosacral 04/11/2018   Chronic anticoagulation (COUMADIN ) 04/11/2018   Severe obesity (BMI 35.0-35.9 with comorbidity) (HCC) 03/15/2018   Secondary osteoarthritis of multiple sites 03/15/2018   Sacral insufficiency fracture 03/15/2018   Chronic low back pain (1ry area of Pain) (Bilateral) w/o sciatica 12/14/2017   Spondylosis without myelopathy or radiculopathy, lumbosacral region 12/14/2017   Other specified dorsopathies, sacral and sacrococcygeal region 12/14/2017   History of allergy to latex 11/09/2017   Grade 1-2 Anterolisthesis of L4/L5 & L5/S1 (stable as of 11/05/2021) 11/02/2017   Lumbar foraminal stenosis (L3-4 and L4-5) (Bilateral) 11/02/2017    Lumbosacral L5-S1 subarticular lateral recess stenosis (Bilateral) 11/02/2017   Lumbar facet arthropathy (L3-4, L4-5, and L5-S1) (Bilateral) 11/02/2017   Lumbar facet syndrome (Bilateral) 11/02/2017   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) 11/02/2017   Lumbar pars defect (L3 and L4) (Bilateral) 11/02/2017   Diabetic peripheral neuropathy (HCC) 11/02/2017   Chronic lower extremity pain (2ry area of Pain) (Bilateral) 11/02/2017   Chronic radicular pain of lower extremity 11/02/2017   Osteoarthritis of hip (Right) 11/02/2017   Chronic low back pain (Bilateral) w/ sciatica (Bilateral) 10/13/2017   Chronic sacroiliac joint pain (Right) 10/13/2017   Chronic pain syndrome 10/13/2017   Long term current use of opiate analgesic 10/13/2017   Pharmacologic therapy 10/13/2017   Disorder of skeletal system 10/13/2017   Problems influencing health status 10/13/2017   Diverticulosis of colon 06/20/2017   History of pulmonary embolism 06/20/2017   S/P IVC filter 06/20/2017   Positive result for methicillin resistant Staphylococcus aureus (MRSA) screening 05/11/2017   History of colostomy reversal 02/24/2017   Chronic diastolic (congestive) heart failure (HCC) 11/22/2016   HCAP (healthcare-associated pneumonia) 11/18/2016   Edema of both legs 11/15/2016   Benign essential hypertension 08/26/2016   DDD (degenerative disc disease), lumbar 07/14/2016   Acute non-recurrent maxillary sinusitis 05/21/2016   Chronic hip pain (Left) 02/23/2016   Nocturnal leg cramps 02/20/2015   Acute bronchitis 12/03/2014   Uncontrolled type 2 diabetes mellitus with hyperglycemia, without long-term current use of insulin  (HCC) 11/12/2014    1. Obstructive chronic bronchitis without exacerbation (HCC) (Primary) Pulmonary disease appears to be under control.  Will continue with current management and supportive care.  2. Chronic respiratory failure with hypoxia (HCC) Oxygen  as required will continue to  follow along saturations currently were 93%  3. Cancer of upper lobe of right lung [facility]) She is being seen in the oncology clinic.  She was supposed to be presented to the tumor board for discussion and conversation about further management.  4. Oxygen  dependent Continue oxygen  on the current flow rate   General Counseling: I have discussed the findings of the evaluation and examination with Dorsie.  I have also discussed any further diagnostic evaluation thatmay be needed  or ordered today. Byron verbalizes understanding of the findings of todays visit. We also reviewed her medications today and discussed drug interactions and side effects including but not limited excessive drowsiness and altered mental states. We also discussed that there is always a risk not just to her but also people around her. she has been encouraged to call the office with any questions or concerns that should arise related to todays visit.  No orders of the defined types were placed in this encounter.    Time spent: 91  I have personally obtained a history, examined the patient, evaluated laboratory and imaging results, formulated the assessment and plan and placed orders.    Elfreda DELENA Bathe, MD [facility] Sleep medicine

## 2024-06-07 ENCOUNTER — Other Ambulatory Visit: Payer: Self-pay

## 2024-06-07 ENCOUNTER — Inpatient Hospital Stay

## 2024-06-07 DIAGNOSIS — C3411 Malignant neoplasm of upper lobe, right bronchus or lung: Secondary | ICD-10-CM

## 2024-06-07 NOTE — Telephone Encounter (Signed)
 Voicemail received from patient today 06/07/24 at 2:43PM requesting a refill on oxycodone , says she has 3-4 left.  Would like it sent to [facility].  Best call back number is [phone]

## 2024-06-08 ENCOUNTER — Encounter: Payer: Self-pay | Admitting: Internal Medicine

## 2024-06-08 ENCOUNTER — Encounter: Payer: Self-pay | Admitting: Oncology

## 2024-06-08 MED ORDER — OXYCODONE HCL 5 MG PO TABS: 5.0000 mg | ORAL_TABLET | Freq: Four times a day (QID) | ORAL | 0 refills | Status: AC | PRN

## 2024-06-11 ENCOUNTER — Encounter: Payer: Self-pay | Admitting: Oncology

## 2024-06-12 ENCOUNTER — Inpatient Hospital Stay: Admitting: Oncology

## 2024-06-12 NOTE — Progress Notes (Unsigned)
 PROVIDER NOTE: Interpretation of information contained herein should be left to medically-trained personnel. Specific patient instructions are provided elsewhere under Patient Instructions section of medical record. This document was created in part using AI and STT-dictation technology, any transcriptional errors that may result from this process are unintentional.  Patient: Kara [name]  Service: E/M   PCP: Sadie Manna, MD  DOB: 1950-10-30  DOS: 06/13/2024  Provider: Eric DELENA Como, MD  MRN: [id number]  Delivery: Face-to-face  Specialty: Interventional Pain Management  Type: Established Patient  Setting: Ambulatory outpatient facility  Specialty designation: 09  Referring Prov.: Sadie Manna, MD  Location: Outpatient office facility       History of present illness (HPI) [name], a 74 y.o. year old female, is here today because of her Chronic bilateral low back pain without sciatica [M54.50, G89.29]. [name] primary complain today is No chief complaint on file.  Pertinent problems: 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); Lumbar pars defect (L3 and L4) (Bilateral); Diabetic peripheral neuropathy (HCC); Chronic lower extremity pain (2ry area of Pain) (Bilateral); Chronic radicular pain of lower extremity; Osteoarthritis of hip (Right); Chronic low back pain (1ry area of Pain) (Bilateral) w/o sciatica;  Spondylosis without myelopathy or radiculopathy, lumbosacral region; Other specified dorsopathies, sacral and sacrococcygeal region; Secondary osteoarthritis of multiple sites; Sacral insufficiency fracture; DDD (degenerative disc disease), lumbosacral; Coccygodynia; Chronic sacroiliac joint pain (Left); Osteoarthritis of lumbar spine; Spondylolisthesis, lumbosacral region; Abnormal MRI, lumbar spine (08/09/2019); Spasm of muscle of lower back; Chronic hip pain (Bilateral) (R>L); Osteoarthritis of hips (Bilateral); Greater trochanteric bursitis (Right); Chronic hip pain (Right); Chronic low back pain (Right) w/o sciatica; Greater trochanteric bursitis (Left); Other bursitis of hip, not elsewhere classified (gluteus medius bursa) (Left); Lumbar facet joint pain; Piriformis muscle pain (Right); Mediastinal adenopathy; Cancer of upper lobe of right lung (HCC); Recurrent non-small cell lung cancer (NSCLC) (HCC); Low back pain of over 3 months duration; Intermittent low back pain; Intractable low back pain; Mechanical low back pain; and Multifactorial low back pain on their pertinent problem list.  Pain Assessment: Severity of   is reported as a  /10. Location:    / . Onset:  . Quality:  . Timing:  . Modifying factor(s):  SABRA Vitals:  vitals were not taken for this visit.  BMI: Estimated body mass index is 28.59 kg/m as calculated from the following:   Height as of 06/04/24: 5' 3 (1.6 m).   Weight as of 06/04/24: 161 lb 6.4 oz (73.2 kg).  Last encounter: 11/02/2023. Last procedure: 04/12/2024.  Reason for encounter: post-procedure evaluation and assessment.   Discussed the use of AI scribe software for clinical note transcription with the patient, who gave verbal consent to proceed.  History of Present Illness          Post-Procedure Evaluation   Procedure: Lumbar Facet, Medial Branch Radiofrequency Ablation (RFA) #2  Laterality: Left (-LT)  Level: L2, L3, L4, L5, and S1 Medial Branch Level(s). These  levels will denervate the L3-4, L4-5, and L5-S1 lumbar facet joints.  Imaging: Fluoroscopy-guided Spinal ([id number])  Anesthesia: Local anesthesia (1-2% Lidocaine ) Anxiolysis: IV Versed  1.0 mg            Sedation: Minimal Sedation None required. No Fentanyl  administered.         DOS: 04/12/2024  Performed by: Eric DELENA Como, MD  Purpose: Therapeutic/Palliative Indications: Low back pain severe enough to impact quality of life or function. Indications: 1. Chronic low back pain (1ry area of Pain) (Bilateral) w/o sciatica   2. Lumbar facet joint pain   3. 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 Lumbar facet arthropathy (L3-4, L4-5, and L5-S1) (Bilateral)   5. Spondylosis without myelopathy or radiculopathy, lumbosacral region   6. Low back pain of over 3 months duration   7. Lumbar facet syndrome (Bilateral)   8. 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 Osteoarthritis of lumbar spine   10. Spondylolisthesis, lumbosacral region   11. Intractable low back pain    Chronic anticoagulation (COUMADIN )    [name] has been dealing with the above chronic pain for longer than three months and has either failed to respond, was unable to tolerate, or simply did not get enough benefit from other more conservative therapies including, but not limited to: 1. Over-the-counter medications 2. Anti-inflammatory medications 3. Muscle relaxants 4. Membrane stabilizers 5. Opioids 6. Physical therapy and/or chiropractic manipulation 7. Modalities (Heat, ice, etc.) 8. Invasive techniques such as nerve blocks. [name] has attained more than 50% relief of the pain from a series of diagnostic injections conducted in separate occasions.  Pain Score: Pre-procedure: 8 /10 Post-procedure: 0-No pain/10    Effectiveness:  Initial hour after procedure:   ***. Subsequent 4-6 hours post-procedure:   ***. Analgesia past initial 6 hours:   ***. Ongoing  improvement:  Analgesic:  *** Function:    ***    ROM:    ***    Interpretation: ***  Pharmacotherapy Assessment   Analgesic: No chronic opioid analgesics therapy prescribed by our practice, 2ry to Medication Agreement Violation. Oxycodone  IR 10 mg, 1 tab PO qd,  from PCP.  (Getting oxycodone  from PCP in multiple occasions, according to PMP)(11/21/20; 12/26/20; 01/16/21; 02/13/21). Opioid analgesic pharmacotherapy D/C'ed on 03/03/2021. MME: 20 mg/day.   Monitoring: [place] PMP: PDMP reviewed during this encounter.       Pharmacotherapy: No side-effects or adverse reactions reported. Compliance: No problems identified. Effectiveness: Clinically acceptable.  No notes on file  UDS:  Summary  Date Value Ref Range Status  11/12/2020 Note  Final    Comment:    ==================================================================== [employer] [facility] 13 (MW) ==================================================================== Test                             Result       Flag       Units  Drug Present and Declared for Prescription Verification   Norhydrocodone                 61           EXPECTED   ng/mg creat    Norhydrocodone is an expected metabolite of hydrocodone .    Tramadol                        >4202        EXPECTED   ng/mg creat   O-Desmethyltramadol            >4202  EXPECTED   ng/mg creat   N-Desmethyltramadol            1712         EXPECTED   ng/mg creat    Source of tramadol  is a prescription medication. O-desmethyltramadol    and N-desmethyltramadol are expected metabolites of tramadol .  Drug Absent but Declared for Prescription Verification   Hydrocodone                     Not Detected UNEXPECTED ng/mg creat    Hydrocodone  is almost always present in patients taking this drug    consistently. Absence of hydrocodone  could be due to lapse of time    since the last dose or unusual pharmacokinetics (rapid  metabolism).  ==================================================================== Test                      Result    Flag   Units      Ref Range   Creatinine              119              mg/dL      >=79 ==================================================================== Declared Medications:  The flagging and interpretation on this report are based on the  following declared medications.  Unexpected results may arise from  inaccuracies in the declared medications.   **Note: The testing scope of this panel includes these medications:   Hydrocodone   Tramadol    **Note: The testing scope of this panel does not include the  following reported medications:   Acetaminophen   Alendronate (Fosamax)  Aspirin   Calcium   Candesartan  Cholecalciferol   Cyanocobalamin   Cyclobenzaprine   Dulaglutide  Gabapentin   Glipizide   Meclizine  Multivitamin  Nystatin  Ondansetron   Ropinirole   Tolterodine  Warfarin ==================================================================== For clinical consultation, please call [phone]. ====================================================================     No results found for: CBDTHCR No results found for: D8THCCBX No results found for: D9THCCBX  ROS  Constitutional: Denies any fever or chills Gastrointestinal: No reported hemesis, hematochezia, vomiting, or acute GI distress Musculoskeletal: Denies any acute onset joint swelling, redness, loss of ROM, or weakness Neurological: No reported episodes of acute onset apraxia, aphasia, dysarthria, agnosia, amnesia, paralysis, loss of coordination, or loss of consciousness  Medication Review  Dulaglutide, HYDROcodone  bit-homatropine, LORazepam , Magnesium  Oxide -Mg Supplement, Oxygen -Helium, Vitamin B-12, albuterol , bisacodyl , budesonide -formoterol , candesartan, cholecalciferol , cyclobenzaprine , gabapentin , glipiZIDE , glucose blood, mometasone -formoterol , omeprazole , ondansetron ,  osimertinib  mesylate, oxyCODONE , oxybutynin , prochlorperazine , rOPINIRole , sucralfate , and warfarin  History Review  Allergy: [name] is allergic to adhesive [tape], avapro  [irbesartan ], morphine  and codeine , other, and latex. Drug: [name]  reports no history of drug use. Alcohol :  reports no history of alcohol  use. Tobacco:  reports that she has never smoked. She has been exposed to tobacco smoke. She has never used smokeless tobacco. Social: [name]  reports that she has never smoked. She has been exposed to tobacco smoke. She has never used smokeless tobacco. She reports that she does not drink alcohol  and does not use drugs. Medical:  has a past medical history of Anginal pain, Arthritis, CHF (congestive heart failure) (HCC), Chronic anticoagulation, Degenerative disc disease, lumbar, Diverticulitis (2018), Diverticulosis, DVT of lower extremity, bilateral (HCC) (2018), Essential hypertension, History of being hospitalized, History of hiatal hernia, Mediastinal adenopathy (04/2023), MRSA nasal colonization, Non-small cell lung cancer (HCC) (2022), Obesity (BMI 30-39.9), Osteoarthritis, Osteoporosis, PE (pulmonary thromboembolism) (HCC) (2018), Pneumonia, Small bowel obstruction (HCC) (03/20/2023), 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HCC), Type 2 diabetes mellitus  with obesity, and Vertigo. Surgical: [name]  has a past surgical history that includes Knee surgery (Right); Carpal tunnel release (Bilateral); Colon resection sigmoid (N/A, 11/02/2016); Colostomy (N/A, 11/02/2016); Sigmoidoscopy (N/A, 11/13/2016); PULMONARY VENOGRAPHY (N/A, 11/19/2016); Colonoscopy with propofol  (N/A, 02/03/2017); Colon surgery (11/02/2016); IVC FILTER INSERTION (Right, 10/2016); Colostomy reversal (N/A, 02/24/2017); Appendectomy (N/A, 02/24/2017); Lysis of adhesion (N/A, 02/24/2017); laparoscopy (N/A, 02/24/2017); Ileo loop diversion (N/A, 02/24/2017); Sigmoidoscopy (N/A, 05/19/2017); Ileostomy closure  (N/A, 06/01/2017); IVC FILTER REMOVAL (N/A, 08/09/2017); Sacroplasty (N/A, 11/08/2017); Cataract extraction w/PHACO (Left, 11/21/2018); Cataract extraction w/PHACO (Right, 12/12/2018); Video bronchoscopy with endobronchial navigation (N/A, 02/23/2021); Flexible bronchoscopy (N/A, 05/16/2023); Endobronchial ultrasound (N/A, 05/16/2023); IR IMAGING GUIDED PORT INSERTION (06/14/2023); IR REMOVAL TUN ACCESS W/ PORT W/O FL MOD SED (11/16/2023); IR Radiologist Eval & Mgmt (04/16/2024); IR KYPHO THORACIC WITH BONE BIOPSY (05/04/2024); and IR KYPHO EA ADDL LEVEL THORACIC OR LUMBAR (05/04/2024). Family: family history includes Breast cancer in her mother; Cancer in her mother; Diabetes in her mother and sister; Heart disease in her father, mother, and sister.  Laboratory Chemistry Profile   Renal Lab Results  Component Value Date   BUN 9 05/30/2024   CREATININE 0.90 05/30/2024   GFRAA >60 07/09/2019   GFRNONAA >60 05/30/2024    Hepatic Lab Results  Component Value Date   AST 15 05/30/2024   ALT <5 05/30/2024   ALBUMIN 3.6 05/30/2024   ALKPHOS 64 05/30/2024   LIPASE 23 12/11/2023    Electrolytes Lab Results  Component Value Date   NA 140 05/30/2024   K 3.6 05/30/2024   CL 104 05/30/2024   CALCIUM  9.2 05/30/2024   MG 2.1 12/13/2023   PHOS 4.0 03/22/2023    Bone Lab Results  Component Value Date   25OHVITD1 25 (L) 11/02/2017   25OHVITD2 <1.0 11/02/2017   25OHVITD3 25 11/02/2017    Inflammation (CRP: Acute Phase) (ESR: Chronic Phase) Lab Results  Component Value Date   CRP 1.7 (H) 08/15/2020   ESRSEDRATE 12 11/02/2017   LATICACIDVEN 0.7 12/11/2023         Note: Above Lab results reviewed.  Recent Imaging Review  NM PET Image Restag (PS) Skull Base To Thigh CLINICAL DATA:  Subsequent treatment strategy for lung cancer.  EXAM: NUCLEAR MEDICINE PET SKULL BASE TO THIGH  TECHNIQUE: 7.7 mCi F-18 FDG was injected intravenously. Full-ring PET imaging was performed from the skull  base to thigh after the radiotracer. CT data was obtained and used for attenuation correction and anatomic localization.  Fasting blood glucose: 122 mg/dl  COMPARISON:  CT chest 03/21/2024, PET 01/13/2024.  FINDINGS: Mediastinal blood pool activity: SUV max 2.5  Liver activity: SUV max NA  NECK:  5 mm medial left parotid nodule (6/18), SUV max 2.6. No additional abnormal hypermetabolism.  Incidental CT findings:  None.  CHEST:  New hypermetabolism within post radiation collapse/consolidation in the anterior right upper lobe, SUV max 5.5. Hypermetabolic subpleural nodularity and pleural thickening in the lower right hemithorax, SUV max 5.7 inferomedially, new from 01/13/2024. Similar mildly hypermetabolic small subcarinal lymph node measures approximately 8 mm, SUV max 4.2.  Incidental CT findings:  Atherosclerotic calcification of the aorta, aortic valve and coronary arteries. Heart is enlarged. No pericardial effusion. Extensive post treatment consolidation in the right upper lobe and perihilar right middle and right lower lobes.  ABDOMEN/PELVIS:  Tiny hypermetabolic right retrocrural lymph node measures 5 mm (6/71), SUV max 2.9. Otherwise, no abnormal hypermetabolism.  Incidental CT findings:  Gallstones. Midline supraumbilical wide necked ventral hernia contain short segments  of small bowel and transverse colon. Small bowel anastomosis in the right hemipelvis.  SKELETON:  No abnormal hypermetabolism.  Incidental CT findings:  Bilateral sacral augmentations. Degenerative changes in the spine. Midthoracic vertebral body augmentations. 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  IMPRESSION: 1. New hypermetabolism within post radiation scarring in the right upper lobe with new hypermetabolic subpleural nodularity in the inferior right hemithorax, findings indicative of disease progression. 2. Associated hypermetabolic subcarinal and  retrocrural lymph nodes. 3. 5 mm medial left parotid nodule is visualized on PET, worrisome for malignancy. 4. Cholelithiasis. 5. Midline supraumbilical ventral hernia contains short segments of small bowel and transverse colon. 6. Aortic atherosclerosis (ICD10-I70.0). Coronary artery calcification.  Electronically Signed   By: Newell Eke M.D.   On: 05/27/2024 15:58 Note: Reviewed        Physical Exam  Vitals: There were no vitals taken for this visit. BMI: Estimated body mass index is 28.59 kg/m as calculated from the following:   Height as of 06/04/24: 5' 3 (1.6 m).   Weight as of 06/04/24: 161 lb 6.4 oz (73.2 kg). Ideal: Ideal body weight: 52.4 kg (115 lb 8.3 oz) Adjusted ideal body weight: 60.7 kg (133 lb 14 oz) General appearance: Well nourished, well developed, and well hydrated. In no apparent acute distress Mental status: Alert, oriented x 3 (person, place, & time)       Respiratory: No evidence of acute respiratory distress Eyes: PERLA   Assessment   Diagnosis Status  1. Chronic low back pain (1ry area of Pain) (Bilateral) w/o sciatica   2. Lumbar facet joint pain   3. Lumbar facet syndrome (Bilateral)   4. Postop check    Controlled Controlled Controlled   Updated Problems: No problems updated.  Plan of Care  Problem-specific:  Assessment and Plan            Kara [name] has a current medication list which includes the following long-term medication(s): albuterol , budesonide -formoterol , candesartan, glipizide , mometasone -formoterol , omeprazole , prochlorperazine , ropinirole , and sucralfate .  Pharmacotherapy (Medications Ordered): No orders of the defined types were placed in this encounter.  Orders:  No orders of the defined types were placed in this encounter.    Interventional Therapies  Risk  Complexity Considerations:   ANTICOAGULATION: Coumadin  (Stop: 5 days  Re-start: 2hrs)  Hx DVT & PE ALLERGY: Latex Morbid obesity  (class III) (BMI>40)  metabolic syndrome  NIDDM  HTN Difficult Lumbar RFA due to morbid obesity    Planned  Pending: Therapeutic right lumbar facet RFA #2 (03/08/2024)    Under consideration:   Therapeutic left lumbar facet RFA #2    Completed:   Therapeutic bilateral lumbar facet MBB [id number] (06/21/2023) [facility])  Diagnostic/therapeutic right IA hip inj. x2 (01/28/2022) (100/100/85/85)  Diagnostic/therapeutic left IA hip inj. x1 (01/28/2022) (100/100/85/85)  Diagnostic/therapeutic right trochanteric bursa inj. x1 (09/15/2021) (85/85/85)  Diagnostic/therapeutic left trochanteric bursa inj. x1 (01/28/2022) (100/100/85/85)  Therapeutic left lumbar facet RFA x1 (07/21/2021) (100/100/100 x1 week/80) (Lasted 9 months)  Therapeutic right lumbar facet RFA x1 (07/07/2021) (100/100/80/80) (Lasted 9 months)  Palliative/Therapeutic left L4-5 LESI x1 (11/10/2017) (100/80/0/0-25)  Diagnostic/therapeutic right L4 TFESI x1 (11/19/2021) (90/80/0/0) (fell at [employer]) Palliative right SI joint block x9 (01/28/2022) (100/100/80/80)  Diagnostic left SI joint block x2 (01/28/2022) (100/100/80/80)  Palliative/Therapeutic (Midline) caudal ESI x3 (06/08/2018) (100/100/100 x 2-3 days/0)    Therapeutic  Palliative (PRN) options:   NO PRN procedures without F2F exam by Dr. Tanya due to multifactorial etiology of chronic  pain. No more procedures until after 08/14/2022.    Pharmacotherapy  Nonopioids transferred 03/27/2020: Calcium       No follow-ups on file.    Recent Visits Date Type Provider Dept  04/12/24 Procedure visit Tanya Glisson, MD [name]  03/27/24 Procedure visit Tanya Glisson, MD [name]  Showing recent visits within past 90 days and meeting all other requirements Future Appointments Date Type Provider Dept  06/13/24 Appointment Tanya Glisson, MD [name]  Showing future appointments within next 90 days and meeting all other  requirements  I discussed the assessment and treatment plan with the patient. The patient was provided an opportunity to ask questions and all were answered. The patient agreed with the plan and demonstrated an understanding of the instructions.  Patient advised to call back or seek an in-person evaluation if the symptoms or condition worsens.  Duration of encounter: *** minutes.  Total time on encounter, as per AMA guidelines included both the face-to-face and non-face-to-face time personally spent by the physician and/or other qualified health care professional(s) on the day of the encounter (includes time in activities that require the physician or other qualified health care professional and does not include time in activities normally performed by clinical staff). Physician's time may include the following activities when performed: Preparing to see the patient (e.g., pre-charting review of records, searching for previously ordered imaging, lab work, and nerve conduction tests) Review of prior analgesic pharmacotherapies. Reviewing PMP Interpreting ordered tests (e.g., lab work, imaging, nerve conduction tests) Performing post-procedure evaluations, including interpretation of diagnostic procedures Obtaining and/or reviewing separately obtained history Performing a medically appropriate examination and/or evaluation Counseling and educating the patient/family/caregiver Ordering medications, tests, or procedures Referring and communicating with other health care professionals (when not separately reported) Documenting clinical information in the electronic or other health record Independently interpreting results (not separately reported) and communicating results to the patient/ family/caregiver Care coordination (not separately reported)  Note by: Glisson DELENA Tanya, MD (TTS and AI technology used. I apologize for any typographical errors that were not detected and corrected.) Date:  06/13/2024; Time: 8:44 PM

## 2024-06-13 ENCOUNTER — Ambulatory Visit (HOSPITAL_BASED_OUTPATIENT_CLINIC_OR_DEPARTMENT_OTHER): Admitting: Pain Medicine

## 2024-06-13 DIAGNOSIS — M47816 Spondylosis without myelopathy or radiculopathy, lumbar region: Secondary | ICD-10-CM

## 2024-06-13 DIAGNOSIS — G8929 Other chronic pain: Secondary | ICD-10-CM

## 2024-06-13 DIAGNOSIS — M5459 Other low back pain: Secondary | ICD-10-CM

## 2024-06-13 DIAGNOSIS — Z09 Encounter for follow-up examination after completed treatment for conditions other than malignant neoplasm: Secondary | ICD-10-CM

## 2024-06-13 DIAGNOSIS — Z91199 Patient's noncompliance with other medical treatment and regimen due to unspecified reason: Secondary | ICD-10-CM

## 2024-06-15 ENCOUNTER — Other Ambulatory Visit: Payer: Self-pay

## 2024-06-15 ENCOUNTER — Other Ambulatory Visit: Payer: Self-pay | Admitting: Pharmacy Technician

## 2024-06-15 ENCOUNTER — Telehealth: Payer: Self-pay | Admitting: Pharmacy Technician

## 2024-06-15 ENCOUNTER — Other Ambulatory Visit (HOSPITAL_COMMUNITY): Payer: Self-pay

## 2024-06-15 ENCOUNTER — Other Ambulatory Visit: Payer: Self-pay | Admitting: Pharmacist

## 2024-06-15 DIAGNOSIS — C3411 Malignant neoplasm of upper lobe, right bronchus or lung: Secondary | ICD-10-CM

## 2024-06-15 MED ORDER — OSIMERTINIB MESYLATE 40 MG PO TABS
40.0000 mg | ORAL_TABLET | Freq: Every day | ORAL | 3 refills | Status: AC
  Filled 2024-06-15: qty 30, 30d supply, fill #0

## 2024-06-15 NOTE — Progress Notes (Signed)
 Specialty Pharmacy Initial Fill Coordination Note  Kara [name] is a 74 y.o. female contacted today regarding initial fill of specialty medication(s) Osimertinib  Mesylate (TAGRISSO )   Patient requested Delivery   Delivery date: 06/20/24   Verified address: [address]   [place] [address]   Medication will be filled on: 06/18/24   Patient is aware of $0 copayment. Bill gant secondary.  [name] [name], CPhT  [facility], [name], [name] - Oral Chemotherapy Patient Advocate Specialist III Phone: [phone]  Fax: [phone]

## 2024-06-15 NOTE — Progress Notes (Signed)
 Specialty Pharmacy Initiation Note   Kara [name] is a 74 y.o. female who will be followed by the specialty pharmacy service for RxSp Oncology    Review of administration, indication, effectiveness, safety, potential side effects, storage/disposable, and missed dose instructions occurred today for patient's specialty medication(s) Osimertinib  Mesylate (TAGRISSO )   Patient switching from PAP to filling at [facility] (Specialty)   Patient/Caregiver did not have any additional questions or concerns.   Patient's therapy is appropriate to: Continue    Goals Addressed             This Visit's Progress    Slow Disease Progression       Patient is initiating therapy. Patient will maintain adherence         Thomasa Heidler N Reg Bircher Specialty Pharmacist

## 2024-06-15 NOTE — Progress Notes (Signed)
 Patient switching from PAP to filling at [facility] (Specialty)

## 2024-06-15 NOTE — Telephone Encounter (Signed)
 Oral Oncology Patient Advocate Encounter  Patient successfully OnBoarded and drug education provided by pharmacist. Medication scheduled to be shipped on 01/26 for delivery on 01/28 from [facility] to patient's address. Patient also knows to call me at [phone] with any questions or concerns regarding receiving medication or if there is any unexpected change in co-pay.    Kara Haviland ([name]) [name], CPhT  [facility], [name], [name] - Oral Chemotherapy Patient Advocate Specialist III Phone: [phone]  Fax: [phone]

## 2024-06-18 ENCOUNTER — Other Ambulatory Visit: Payer: Self-pay

## 2024-06-18 ENCOUNTER — Ambulatory Visit: Admitting: Pain Medicine

## 2024-06-19 ENCOUNTER — Other Ambulatory Visit: Payer: Self-pay

## 2024-06-19 ENCOUNTER — Telehealth: Payer: Self-pay | Admitting: Oncology

## 2024-06-19 NOTE — Telephone Encounter (Signed)
 Called pt and got her scheduled to see Dr. Melanee on 1/29 at 1115. Patient confirmed date and time

## 2024-06-21 ENCOUNTER — Encounter: Payer: Self-pay | Admitting: Oncology

## 2024-06-21 ENCOUNTER — Inpatient Hospital Stay (HOSPITAL_BASED_OUTPATIENT_CLINIC_OR_DEPARTMENT_OTHER): Admitting: Oncology

## 2024-06-21 VITALS — BP 97/51 | HR 100 | Temp 97.6°F | Resp 19 | Ht 63.0 in | Wt 157.7 lb

## 2024-06-21 DIAGNOSIS — Z7189 Other specified counseling: Secondary | ICD-10-CM | POA: Diagnosis not present

## 2024-06-21 DIAGNOSIS — C3490 Malignant neoplasm of unspecified part of unspecified bronchus or lung: Secondary | ICD-10-CM | POA: Diagnosis not present

## 2024-06-21 DIAGNOSIS — Z79899 Other long term (current) drug therapy: Secondary | ICD-10-CM | POA: Diagnosis not present

## 2024-06-21 DIAGNOSIS — Z5111 Encounter for antineoplastic chemotherapy: Secondary | ICD-10-CM

## 2024-06-21 DIAGNOSIS — C3411 Malignant neoplasm of upper lobe, right bronchus or lung: Secondary | ICD-10-CM

## 2024-06-21 MED ORDER — PROCHLORPERAZINE MALEATE 10 MG PO TABS: 10.0000 mg | ORAL_TABLET | Freq: Four times a day (QID) | ORAL | 1 refills | Status: AC | PRN

## 2024-06-21 MED ORDER — ONDANSETRON HCL 8 MG PO TABS: 8.0000 mg | ORAL_TABLET | Freq: Three times a day (TID) | ORAL | 1 refills | Status: AC | PRN

## 2024-06-21 MED ORDER — LIDOCAINE-PRILOCAINE 2.5-2.5 % EX CREA: TOPICAL_CREAM | CUTANEOUS | 3 refills | Status: AC

## 2024-06-21 MED ORDER — FOLIC ACID 1 MG PO TABS: 1.0000 mg | ORAL_TABLET | Freq: Every day | ORAL | 3 refills | Status: AC

## 2024-06-21 MED ORDER — DEXAMETHASONE 4 MG PO TABS: ORAL_TABLET | ORAL | 1 refills | Status: AC

## 2024-06-21 NOTE — Progress Notes (Signed)
 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 She was in remission until November 2024 when she was noted to have hypermetabolic in the right hilum as well as mediastinal region. 15 x 12 mm nodular opacity in the dome of the right diaphragm. 10 mm left cervical lymph node. 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 Right pleural thoracentesis negative for malignancy.  Patient completed concurrent chemoradiation with weekly CarboTaxol in March 2025.  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  MSI not high.  CT DNA tumor fraction 7%   CT scans after concurrent chemoradiation showed stable disease with stable mediastinal adenopathy asWell as postradiation changes noted in the perihilar right lung.  She he is on adjuvant Tagrisso  starting May 2025.  Initially patient was on full dose 80 mg daily but reduced to 40 mg subsequently due to diarrhea  PETET scan in December 2025 showed increasing hypermetabolism and pleural nodularity along with hypermetabolic subcarinal and retrocrural lymph nodes concerning for disease progression.  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  She was initially diagnosed with stage III non-small cell lung cancer and treated with concurrent chemoradiation. Molecular testing identified an EGFR mutation, and she began osimertinib  (Tagrisso ) in May 2025. Serial imaging has been performed for disease surveillance. CT chest in October 2025 revealed new pleural-based lesions, and PET scan in December 2025 demonstrated increased uptake along the right lung pleura and a lymph node, consistent with recurrent disease. No repeat bronchoscopy was performed.  She continues osimertinib  at a dose of 40 mg daily due to intolerance of the 80 mg dose secondary to diarrhea. Despite ongoing therapy, imaging demonstrates disease progression. Her port was previously removed.  She describes a stinging and burning sensation localized to the right chest, which feels warm but is not associated with rash or herpes zoster. She denies pain at rest but notes discomfort that sometimes improves with gentle touch. She experiences pleuritic chest pain with coughing, described as severe and similar to myocardial infarction, attributed to pleural involvement. She uses pain medication as needed and does not report severe or uncontrolled pain. She denies fever, nausea, or vomiting.      ECOG PS- 2 Pain scale- 4   Review of systems- Review of Systems  Constitutional:  Positive for malaise/fatigue. Negative for chills, fever and weight loss.  HENT:  Negative for congestion, ear discharge and nosebleeds.   Eyes:  Negative for blurred vision.  Respiratory:  Positive for shortness of breath. Negative for cough, hemoptysis, sputum production and wheezing.        Right chest wall pain  Cardiovascular:  Negative for chest pain, palpitations, orthopnea and claudication.  Gastrointestinal:  Negative for abdominal  pain, blood in stool, constipation, diarrhea, heartburn, melena, nausea and  vomiting.  Genitourinary:  Negative for dysuria, flank pain, frequency, hematuria and urgency.  Musculoskeletal:  Negative for back pain, joint pain and myalgias.  Skin:  Negative for rash.  Neurological:  Negative for dizziness, tingling, focal weakness, seizures, weakness and headaches.  Endo/Heme/Allergies:  Does not bruise/bleed easily.  Psychiatric/Behavioral:  Negative for depression and suicidal ideas. The patient does not have insomnia.       Allergies[1]   Past Medical History:  Diagnosis Date   Anginal pain    Arthritis    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, 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   CHF (congestive heart failure) (HCC)    followed colon resection, wouldn't let me get up   Chronic anticoagulation    Degenerative disc disease, lumbar    Diverticulitis 2018   Diverticulosis    DVT of lower extremity, bilateral (HCC) 2018   Essential hypertension    History of being hospitalized    1 month ago for 3 days, vomiting, and kink in upper intestine   History of hiatal hernia    Mediastinal adenopathy 04/2023   MRSA nasal colonization    Non-small cell lung cancer (HCC) 2022   right   Obesity (BMI 30-39.9)    Osteoarthritis    Osteoporosis    PE (pulmonary thromboembolism) (HCC) 2018   Pneumonia    Small bowel obstruction (HCC) 03/20/2023   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HCC)    Type 2 diabetes mellitus with obesity    Vertigo      Past Surgical History:  Procedure Laterality Date   APPENDECTOMY N/A 02/24/2017   Procedure: Incidental  APPENDECTOMY;  Surgeon: Shelva Dunnings, MD;  Location: [name];  Service: General;  Laterality: N/A;   CARPAL TUNNEL RELEASE Bilateral    CATARACT EXTRACTION W/PHACO Left 11/21/2018   Procedure: CATARACT EXTRACTION PHACO AND INTRAOCULAR LENS PLACEMENT (IOC) LEFT DIABETES;  Surgeon: Jaye Fallow, MD;  Location: [facility];  Service: Ophthalmology;  Laterality: Left;  latex sensitivity Diabetic - oral meds   CATARACT  EXTRACTION W/PHACO Right 12/12/2018   Procedure: CATARACT EXTRACTION PHACO AND INTRAOCULAR LENS PLACEMENT (IOC) RIGHT DIABETES;  Surgeon: Jaye Fallow, MD;  Location: [facility];  Service: Ophthalmology;  Laterality: Right;  Diabetes-oral med Latex sensitiviy   COLON RESECTION SIGMOID N/A 11/02/2016   Procedure: COLON RESECTION SIGMOID;  Surgeon: Shelva Dunnings, MD;  Location: [name];  Service: General;  Laterality: N/A;   COLON SURGERY  11/02/2016   COLONOSCOPY WITH PROPOFOL  N/A 02/03/2017   Procedure: COLONOSCOPY WITH PROPOFOL ;  Surgeon: Unk Corinn Skiff, MD;  Location: [name];  Service: Gastroenterology;  Laterality: N/A;   COLOSTOMY N/A 11/02/2016   Procedure: COLOSTOMY;  Surgeon: Shelva Dunnings, MD;  Location: [name];  Service: General;  Laterality: N/A;   COLOSTOMY REVERSAL N/A 02/24/2017   Procedure: COLOSTOMY REVERSAL, ostomy takedown, spleenic flexure mobilization, excision rectal stump/distal sigmoid, anastomosis with suture reinforcement;  Surgeon: Shelva Dunnings, MD;  Location: [name];  Service: General;  Laterality: N/A;   ENDOBRONCHIAL ULTRASOUND N/A 05/16/2023   Procedure: ENDOBRONCHIAL ULTRASOUND;  Surgeon: Tamea Dedra CROME, MD;  Location: [name];  Service: Cardiopulmonary;  Laterality: N/A;   FLEXIBLE BRONCHOSCOPY N/A 05/16/2023   Procedure: FLEXIBLE BRONCHOSCOPY;  Surgeon: Tamea Dedra CROME, MD;  Location: [name];  Service: Cardiopulmonary;  Laterality: N/A;   ILEO LOOP DIVERSION N/A 02/24/2017   Procedure: ILEO LOOP COLOSTOMY;  Surgeon: Shelva Dunnings, MD;  Location: [name];  Service:  General;  Laterality: N/A;   ILEOSTOMY CLOSURE N/A 06/01/2017   Procedure: LOOP ILEOSTOMY TAKEDOWN;  Surgeon: Shelva Dunnings, MD;  Location: [name];  Service: General;  Laterality: N/A;   IR IMAGING GUIDED PORT INSERTION  06/14/2023   IR KYPHO EA ADDL LEVEL THORACIC OR LUMBAR  05/04/2024   IR KYPHO THORACIC WITH BONE BIOPSY  05/04/2024   IR  RADIOLOGIST EVAL & MGMT  04/16/2024   IR REMOVAL TUN ACCESS W/ PORT W/O FL MOD SED  11/16/2023   IVC FILTER INSERTION Right 10/2016   IVC FILTER REMOVAL N/A 08/09/2017   Procedure: IVC FILTER REMOVAL;  Surgeon: Jama Cordella MATSU, MD;  Location: [name];  Service: Cardiovascular;  Laterality: N/A;   KNEE SURGERY Right    torn menicus   LAPAROSCOPY N/A 02/24/2017   Procedure: LAPAROSCOPY DIAGNOSTIC;  Surgeon: Shelva Dunnings, MD;  Location: [name];  Service: General;  Laterality: N/A;   LYSIS OF ADHESION N/A 02/24/2017   Procedure: LYSIS OF ADHESION;  Surgeon: Shelva Dunnings, MD;  Location: [name];  Service: General;  Laterality: N/A;   PULMONARY VENOGRAPHY N/A 11/19/2016   Procedure: Pulmonary Venography; IVC filter placement; possible pulmonary thrombectomy;  Surgeon: Jama Cordella MATSU, MD;  Location: [name];  Service: Cardiovascular;  Laterality: N/A;   SACROPLASTY N/A 11/08/2017   Procedure: SACROPLASTY S2;  Surgeon: Kathlynn Sharper, MD;  Location: [name];  Service: Orthopedics;  Laterality: N/A;   SIGMOIDOSCOPY N/A 11/13/2016   Procedure: endoscopic  flexible SIGMOIDOSCOPY;  Surgeon: Wonda Charlie BRAVO, MD;  Location: [name];  Service: General;  Laterality: N/A;   SIGMOIDOSCOPY N/A 05/19/2017   Procedure: ENID MORIN;  Surgeon: Shelva Dunnings, MD;  Location: [name];  Service: General;  Laterality: N/A;   VIDEO BRONCHOSCOPY WITH ENDOBRONCHIAL NAVIGATION N/A 02/23/2021   Procedure: ROBOTIC VIDEO BRONCHOSCOPY WITH ENDOBRONCHIAL NAVIGATION;  Surgeon: Tamea Dedra CROME, MD;  Location: [name];  Service: Pulmonary;  Laterality: N/A;    Social History   Socioeconomic History   Marital status: Married    Spouse name: Butler   Number of children: 1   Years of education: Not on file   Highest education level: Not on file  Occupational History   Not on file  Tobacco Use   Smoking status: Never    Passive exposure: Current (husband  smokes)   Smokeless tobacco: Never  Vaping Use   Vaping status: Never Used  Substance and Sexual Activity   Alcohol  use: No    Alcohol /week: 0.0 standard drinks of alcohol    Drug use: No   Sexual activity: Never  Other Topics Concern   Not on file  Social History Narrative   Not on file   Social Drivers of Health   Tobacco Use: Medium Risk (06/21/2024)   Patient History    Smoking Tobacco Use: Never    Smokeless Tobacco Use: Never    Passive Exposure: Current  Financial Resource Strain: Low Risk  (05/18/2024)   Received from [facility]   Overall Financial Resource Strain (CARDIA)    Difficulty of Paying Living Expenses: Not hard at all  Food Insecurity: No Food Insecurity (05/18/2024)   [facility]   Epic    Within the past 12 months, you worried that your food would run out before you got the money to buy more.: Never true    Within the past 12 months, the food you bought just didn't last and you didn't have money to get  more.: Never true  Transportation Needs: No Transportation Needs (05/18/2024)   Received from [facility] - Transportation    In the past 12 months, has lack of transportation kept you from medical appointments or from getting medications?: No    Lack of Transportation (Non-Medical): No  Physical Activity: Not on file  Stress: Not on file  Social Connections: Moderately Isolated (12/12/2023)   Social Connection and Isolation Panel    Frequency of Communication with Friends and Family: More than three times a week    Frequency of Social Gatherings with Friends and Family: Once a week    Attends Religious Services: Never    Database Administrator or Organizations: No    Attends Banker Meetings: Never    Marital Status: Married  Catering Manager Violence: Not At Risk (12/12/2023)   Epic    Fear of Current or Ex-Partner: No    Emotionally Abused: No    Physically  Abused: No    Sexually Abused: No  Depression (PHQ2-9): Low Risk (06/21/2024)   Depression (PHQ2-9)    PHQ-2 Score: 0  Alcohol  Screen: Not on file  Housing: Low Risk  (05/18/2024)   Received from [facility]   Epic    In the last 12 months, was there a time when you were not able to pay the mortgage or rent on time?: No    In the past 12 months, how many times have you moved where you were living?: 0    At any time in the past 12 months, were you homeless or living in a shelter (including now)?: No  Utilities: Not At Risk (05/18/2024)   Received from [facility]   Epic    In the past 12 months has the electric, gas, oil, or water company threatened to shut off services in your home?: No  Health Literacy: Not on file    Family History  Problem Relation Age of Onset   Diabetes Mother    Heart disease Mother    Cancer Mother    Breast cancer Mother        >50   Heart disease Father    Diabetes Sister    Heart disease Sister     Current Medications[2]  Physical exam:  Vitals:   06/21/24 1119  BP: (!) 97/51  Pulse: 100  Resp: 19  Temp: 97.6 F (36.4 C)  TempSrc: Tympanic  SpO2: 99%  Weight: 157 lb 11.2 oz (71.5 kg)  Height: 5' 3 (1.6 m)   Physical Exam Constitutional:      Comments: On home O2  Cardiovascular:     Rate and Rhythm: Normal rate and regular rhythm.     Heart sounds: Normal heart sounds.  Pulmonary:     Effort: Pulmonary effort is normal.     Breath sounds: Normal breath sounds.  Skin:    General: Skin is warm and dry.  Neurological:     Mental Status: She is alert and oriented to person, place, and time.      I have personally reviewed labs listed below:    Latest Ref Rng & Units 05/30/2024    9:32 AM  CMP  Glucose 70 - 99 mg/dL 783   BUN 8 - 23 mg/dL 9   Creatinine 9.55 - 8.99 mg/dL 9.09   Sodium 864 - 854 mmol/L 140   Potassium 3.5 - 5.1 mmol/L 3.6   Chloride 98 - 111 mmol/L 104  CO2 22 - 32 mmol/L  25   Calcium  8.9 - 10.3 mg/dL 9.2   Total Protein 6.5 - 8.1 g/dL 7.2   Total Bilirubin 0.0 - 1.2 mg/dL 0.3   Alkaline Phos 38 - 126 U/L 64   AST 15 - 41 U/L 15   ALT 0 - 44 U/L <5       Latest Ref Rng & Units 05/30/2024    9:32 AM  CBC  WBC 4.0 - 10.5 K/uL 5.2   Hemoglobin 12.0 - 15.0 g/dL 88.5   Hematocrit 63.9 - 46.0 % 35.6   Platelets 150 - 400 K/uL 117      Assessment and plan- Patient is a 74 y.o. female with history of stage I right upper lobe lung cancer s/p SBRT in the past. She had biopsy-proven mediastinal recurrence in December 2024. She is s/p concurrent chemoradiation and adjuvant Tagrisso  for L858 mutation. She is here to discuss further management  Assessment and Plan    Recurrent non-small cell lung cancer with EGFR mutation Disease recurrence and progression confirmed by tumor board, with new pleural and lymph node uptake on PET scan despite ongoing Tagrisso  therapy. Plan to reintroduce chemotherapy to address progression, balancing efficacy and tolerability. Rationale for continuing Tagrisso  with added chemotherapy is to improve disease control. - Continue Tagrisso  at current dose. - Initiate chemotherapy with carboplatin  and pemetrexed (Alimta) every 21 days for four cycles. - After four cycles, discontinue carboplatin  and continue pemetrexed every 21 days. - Arrange port placement for chemotherapy administration next week. - Schedule first chemotherapy treatment for July 04, 2024. - If there is progression of disease I will stop Tagrisso  and switch her to subcutaneous Amivantamab at that time.  We did repeat peripheral blood NGS testing and she did not have met amplification and therefore she is not a candidate for tepotinib  Cancer-related pain Experiences stinging and burning right chest pain, likely due to pleural involvement from recurrent malignancy, and pleuritic pain with coughing. - Continue current analgesic regimen as needed. - Assess pain during  chemotherapy and adjust management as indicated.  Chemotherapy-induced immunosuppression risk Increased risk for infection, fatigue, and cytopenias due to planned chemotherapy. She is aware of these risks from prior treatment. - Monitor for infection, fatigue, and cytopenias during chemotherapy. - Provide antiemetic therapy as needed for nausea and vomiting.     I personally spent a total of 45 minutes in the care of the patient today including preparing to see the patient, getting/reviewing separately obtained history, performing a medically appropriate exam/evaluation, counseling and educating, placing orders, independently interpreting results, and coordinating care.   Visit Diagnosis 1. Cancer of upper lobe of right lung (HCC)   2. Encounter for antineoplastic chemotherapy      Dr. Annah Skene, MD, MPH [facility] at [facility] [phone] 06/21/2024 1:33 PM                   [1]  Allergies Allergen Reactions   Adhesive [Tape] Other (See Comments)    Pt reports, when removing tape from skin most tapes pull her skin off. Ok to use paper tape   Avapro  [Irbesartan ] Anaphylaxis    Facial swelling   Morphine  And Codeine  Nausea And Vomiting   Other Hives    Chlorhexadine wipes/CHG wipes,    Latex Rash    Exam gloves/ does not react around elastic and lips don't swell when blowing balloons  [2]  Current Outpatient Medications:    albuterol  (VENTOLIN  HFA) 108 (  90 Base) MCG/ACT inhaler, Inhale 2 puffs into the lungs every 6 (six) hours as needed for wheezing or shortness of breath., Disp: 18 g, Rfl: 3   bisacodyl  (DULCOLAX) 10 MG suppository, Place 1 suppository (10 mg total) rectally as needed for moderate constipation., Disp: 12 suppository, Rfl: 0   budesonide -formoterol  (SYMBICORT ) 80-4.5 MCG/ACT inhaler, Inhale 2 puffs into the lungs 2 (two) times daily., Disp: 10.2 g, Rfl: 3   candesartan (ATACAND) 16 MG tablet, Take 16 mg by mouth in the morning.,  Disp: , Rfl:    cholecalciferol  (VITAMIN D ) 25 MCG (1000 UNIT) tablet, Take 1,000 Units by mouth in the morning., Disp: , Rfl:    Cyanocobalamin  (VITAMIN B-12) 5000 MCG TBDP, Take 5,000 mcg by mouth in the morning., Disp: , Rfl:    cyclobenzaprine  (FLEXERIL ) 10 MG tablet, Take 10 mg by mouth at bedtime., Disp: , Rfl:    Dulaglutide (TRULICITY) 1.5 MG/0.5ML SOAJ, Inject 1.5 mg into the skin once a week. Saturday, Disp: , Rfl:    gabapentin  (NEURONTIN ) 100 MG capsule, Take 200 mg by mouth with breakfast, with lunch, and with evening meal., Disp: , Rfl:    gabapentin  (NEURONTIN ) 300 MG capsule, Take 300 mg by mouth at bedtime., Disp: , Rfl:    glipiZIDE  (GLUCOTROL ) 10 MG tablet, Take 10 mg by mouth daily before breakfast., Disp: , Rfl:    HYDROcodone  bit-homatropine (HYCODAN) 5-1.5 MG/5ML syrup, Take 5 mLs by mouth every 8 (eight) hours as needed for cough., Disp: 120 mL, Rfl: 0   LORazepam  (ATIVAN ) 0.5 MG tablet, Take 1 tablet by mouth 30 minutes prior to MRI scan. May take additional tablet if needed., Disp: 2 tablet, Rfl: 0   Magnesium  Oxide -Mg Supplement 500 MG TABS, Take 1 tablet by mouth at bedtime., Disp: , Rfl:    mometasone -formoterol  (DULERA) 100-5 MCG/ACT AERO, Inhale 2 puffs into the lungs 2 (two) times daily., Disp: 1 each, Rfl: 3   omeprazole  (PRILOSEC) 20 MG capsule, Take 1 capsule (20 mg total) by mouth daily., Disp: 90 capsule, Rfl: 0   ONETOUCH ULTRA TEST test strip, , Disp: , Rfl:    osimertinib  mesylate (TAGRISSO ) 40 MG tablet, Take 1 tablet (40 mg total) by mouth daily., Disp: 30 tablet, Rfl: 3   oxybutynin  (DITROPAN ) 5 MG tablet, Take 1 tablet by mouth 2 (two) times daily., Disp: , Rfl:    oxyCODONE  (OXY IR/ROXICODONE ) 5 MG immediate release tablet, Take 1 tablet (5 mg total) by mouth every 6 (six) hours as needed for severe pain (pain score 7-10)., Disp: 90 tablet, Rfl: 0   OXYGEN , Inhale 2 L into the lungs., Disp: , Rfl:    rOPINIRole  (REQUIP ) 1 MG tablet, Take 1 mg by mouth  at bedtime., Disp: , Rfl:    sucralfate  (CARAFATE ) 1 g tablet, Take 1 tablet (1 g total) by mouth 3 (three) times daily with meals. Dissolve tablet in 4 tablesppons warm water swish and swallow 30 min before meals. (Patient taking differently: Take 1 g by mouth 3 (three) times daily with meals. Dissolve tablet in 4 tablespoons warm water swish and swallow 30 min before meals.), Disp: 90 tablet, Rfl: 0   warfarin (COUMADIN ) 5 MG tablet, Take 5 mg by mouth daily., Disp: , Rfl:    dexamethasone  (DECADRON ) 4 MG tablet, Take 1 tab by mouth 2 times daily starting day before pemetrexed. Then take 2 tabs daily x 3 days starting day after chemo. Take with food., Disp: 30 tablet, Rfl: 1  folic acid  (FOLVITE ) 1 MG tablet, Take 1 tablet (1 mg total) by mouth daily. Start 7 days before pemetrexed chemotherapy. Continue until 21 days after pemetrexed completed., Disp: 100 tablet, Rfl: 3   lidocaine -prilocaine  (EMLA ) cream, Apply to affected area once, Disp: 30 g, Rfl: 3   ondansetron  (ZOFRAN ) 8 MG tablet, Take 1 tablet (8 mg total) by mouth every 8 (eight) hours as needed for nausea or vomiting. Start on the third day after chemotherapy., Disp: 30 tablet, Rfl: 1   prochlorperazine  (COMPAZINE ) 10 MG tablet, Take 1 tablet (10 mg total) by mouth every 6 (six) hours as needed for nausea or vomiting., Disp: 30 tablet, Rfl: 1  "

## 2024-06-21 NOTE — Progress Notes (Signed)
 Patient not feeling the best today, but here to hear results regarding liquid biopsy.

## 2024-06-22 ENCOUNTER — Encounter: Payer: Self-pay | Admitting: Oncology

## 2024-06-22 ENCOUNTER — Other Ambulatory Visit (HOSPITAL_COMMUNITY): Payer: Self-pay

## 2024-06-22 ENCOUNTER — Telehealth: Payer: Self-pay | Admitting: Pharmacy Technician

## 2024-06-22 ENCOUNTER — Telehealth: Payer: Self-pay

## 2024-06-22 NOTE — Telephone Encounter (Signed)
 Oral Oncology Patient Advocate Encounter   New authorization  Member ID: [id number]   Received notification that prior authorization for lidocaine -prilocaine  (EMLA ) cream is required.   PA submitted on CMM via Latent Key [id number] Status is pending     Kara [name] ([name]) [name], CPhT  [name], [name], [name] - [name] Patient Advocate Phone: [phone]  Fax: [phone]

## 2024-06-22 NOTE — Telephone Encounter (Signed)
 This patient is need of a port placement later next week, tentatively starting carbo alimta 2/11. Per Clarita Bodily 2/3 at 2p an arrive at 1p or Thurs at 2:30p an arrive at 1:30p. Patient agreed to Thurs 2/5 arrive 1:30pm, appointment 2:30pm. Informed NPO 6-8 hours prior, will need a driver present due to sedation involved and [facility].

## 2024-06-22 NOTE — Telephone Encounter (Signed)
 Oral Oncology Patient Advocate Encounter  Prior Authorization for lidocaine -prilocaine  (EMLA ) cream has been approved.    PA# [id number] Effective dates: 06/22/2024 through 09/20/2024  Xan Sparkman ([name]) [name], CPhT  [facility], [name], [name] - [name] Patient Advocate Phone: [phone]  Fax: [phone]

## 2024-06-22 NOTE — Telephone Encounter (Signed)
 Oral Oncology Patient Advocate Encounter  Prior Authorization for ondansetron  (ZOFRAN ) 8 MG tablet has been approved.    PA# [id number] Effective dates: 06/22/2024 through 05/23/2025  Shaine Newmark ([name]) [name], CPhT  [facility], [name], [name] - [name] Patient Advocate Phone: [phone]  Fax: [phone]

## 2024-06-22 NOTE — Telephone Encounter (Signed)
 Oral Oncology Patient Advocate Encounter   New authorization  Member ID: [id number]   Received notification that prior authorization for ondansetron  (ZOFRAN ) 8 MG tablet is required.   PA submitted on CMM via Latent Key [id number] Status is pending     Quentin Strebel ([name]) [name], CPhT  [name], [name], [name] - [name] Patient Advocate Phone: [phone]  Fax: [phone]

## 2024-06-27 ENCOUNTER — Telehealth: Payer: Self-pay

## 2024-06-27 ENCOUNTER — Other Ambulatory Visit (HOSPITAL_COMMUNITY): Payer: Self-pay | Admitting: Radiology

## 2024-06-27 DIAGNOSIS — C3411 Malignant neoplasm of upper lobe, right bronchus or lung: Secondary | ICD-10-CM

## 2024-06-27 NOTE — Telephone Encounter (Signed)
 Patient for IR Port Insertion on Thurs 06/28/24, I called and spoke with the patient on the phone and gave pre-procedure instructions. Pt was made aware to be here at 1:30p, NPO after MN prior to procedure as well as driver post procedure/recovery/discharge. Pt stated understanding. Called 06/27/24

## 2024-06-27 NOTE — H&P (Signed)
 "    Chief Complaint: Patient was seen in consultation today for Port-A-Cath placement in assistance with chemotherapy administration, and in treatment of Right lung cancer recurrence.  Referring Provider(s): Dr. Annah Skene, MD.   Supervising Physician: Philip Cornet  Patient Status: [facility] - Out-pt  Patient is Full Code  History of Present Illness: Kara [name] is a 74 y.o. 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 PMHx otherwise as noted below.   Patient is know to IR service, having most recently undergone L4-L5 kyphoplasty on 05/04/24 by Dr. Hughes.  Per Dr. Darold progress note dated 06/18/24: [...] Recurrent non-small cell lung cancer with EGFR mutation Disease recurrence and progression confirmed by tumor board, with new pleural and lymph node uptake on PET scan despite ongoing Tagrisso  therapy. Plan to reintroduce chemotherapy to address progression, balancing efficacy and tolerability. Rationale for continuing Tagrisso  with added chemotherapy is to improve disease control. - Continue Tagrisso  at current dose. - Initiate chemotherapy with carboplatin  and pemetrexed (Alimta) every 21 days for four cycles. - After four cycles, discontinue carboplatin  and continue pemetrexed every 21 days. - Arrange port placement for chemotherapy administration next week. - Schedule first chemotherapy treatment for July 04, 2024 [...].  Interventional Radiology was requested for Port-A-Cath placement. Patient is scheduled for same in IR today.   Patient is alert and laying in bed, calm. Her close friend is at her side. Patient is currently without any significant complaints. She does state she feels tired. Patient denies any fevers, headache, chest pain, SOB, cough, abdominal pain, nausea, vomiting or  bleeding.    Past Medical History:  Diagnosis Date   Anginal pain    Arthritis    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, 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   CHF (congestive heart failure) (HCC)    followed colon resection, wouldn't let me get up   Chronic anticoagulation    Degenerative disc disease, lumbar    Diverticulitis 2018   Diverticulosis    DVT of lower extremity, bilateral (HCC) 2018   Essential hypertension    History of being hospitalized    1 month ago for 3 days, vomiting, and kink in upper intestine   History of hiatal hernia    Mediastinal adenopathy 04/2023   MRSA nasal colonization    Non-small cell lung cancer (HCC) 2022   right   Obesity (BMI 30-39.9)    Osteoarthritis    Osteoporosis    PE (pulmonary thromboembolism) (HCC) 2018   Pneumonia    Small bowel obstruction (HCC) 03/20/2023   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HCC)    Type 2 diabetes mellitus with obesity    Vertigo     Past Surgical History:  Procedure Laterality Date   APPENDECTOMY N/A 02/24/2017   Procedure: Incidental  APPENDECTOMY;  Surgeon: Shelva Dunnings, MD;  Location: [name] ORS;  Service: General;  Laterality: N/A;   CARPAL TUNNEL RELEASE Bilateral    CATARACT EXTRACTION W/PHACO Left 11/21/2018   Procedure: CATARACT EXTRACTION PHACO AND INTRAOCULAR LENS PLACEMENT (IOC) LEFT DIABETES;  Surgeon: Jaye Fallow, MD;  Location: [facility];  Service: Ophthalmology;  Laterality: Left;  latex sensitivity Diabetic - oral meds   CATARACT EXTRACTION W/PHACO Right 12/12/2018   Procedure: CATARACT EXTRACTION PHACO AND INTRAOCULAR LENS PLACEMENT (IOC) RIGHT DIABETES;  Surgeon: Jaye Fallow, MD;  Location: [facility];  Service: Ophthalmology;  Laterality: Right;  Diabetes-oral med Latex sensitiviy   COLON RESECTION SIGMOID N/A 11/02/2016   Procedure: COLON RESECTION SIGMOID;  Surgeon: Shelva Dunnings, MD;  Location: [name] OR[facility];  Service: General;  Laterality: N/A;   COLON SURGERY   11/02/2016   COLONOSCOPY WITH PROPOFOL  N/A 02/03/2017   Procedure: COLONOSCOPY WITH PROPOFOL ;  Surgeon: Unk Corinn Skiff, MD;  Location: [name];  Service: Gastroenterology;  Laterality: N/A;   COLOSTOMY N/A 11/02/2016   Procedure: COLOSTOMY;  Surgeon: Shelva Dunnings, MD;  Location: [name];  Service: General;  Laterality: N/A;   COLOSTOMY REVERSAL N/A 02/24/2017   Procedure: COLOSTOMY REVERSAL, ostomy takedown, spleenic flexure mobilization, excision rectal stump/distal sigmoid, anastomosis with suture reinforcement;  Surgeon: Shelva Dunnings, MD;  Location: [name];  Service: General;  Laterality: N/A;   ENDOBRONCHIAL ULTRASOUND N/A 05/16/2023   Procedure: ENDOBRONCHIAL ULTRASOUND;  Surgeon: Tamea Dedra CROME, MD;  Location: [name];  Service: Cardiopulmonary;  Laterality: N/A;   FLEXIBLE BRONCHOSCOPY N/A 05/16/2023   Procedure: FLEXIBLE BRONCHOSCOPY;  Surgeon: Tamea Dedra CROME, MD;  Location: [name];  Service: Cardiopulmonary;  Laterality: N/A;   ILEO LOOP DIVERSION N/A 02/24/2017   Procedure: ILEO LOOP COLOSTOMY;  Surgeon: Shelva Dunnings, MD;  Location: [name];  Service: General;  Laterality: N/A;   ILEOSTOMY CLOSURE N/A 06/01/2017   Procedure: LOOP ILEOSTOMY TAKEDOWN;  Surgeon: Shelva Dunnings, MD;  Location: [name];  Service: General;  Laterality: N/A;   IR IMAGING GUIDED PORT INSERTION  06/14/2023   IR KYPHO EA ADDL LEVEL THORACIC OR LUMBAR  05/04/2024   IR KYPHO THORACIC WITH BONE BIOPSY  05/04/2024   IR RADIOLOGIST EVAL & MGMT  04/16/2024   IR REMOVAL TUN ACCESS W/ PORT W/O FL MOD SED  11/16/2023   IVC FILTER INSERTION Right 10/2016   IVC FILTER REMOVAL N/A 08/09/2017   Procedure: IVC FILTER REMOVAL;  Surgeon: Jama Cordella MATSU, MD;  Location: [name];  Service: Cardiovascular;  Laterality: N/A;   KNEE SURGERY Right    torn menicus   LAPAROSCOPY N/A 02/24/2017   Procedure: LAPAROSCOPY DIAGNOSTIC;  Surgeon: Shelva Dunnings, MD;  Location:  [name];  Service: General;  Laterality: N/A;   LYSIS OF ADHESION N/A 02/24/2017   Procedure: LYSIS OF ADHESION;  Surgeon: Shelva Dunnings, MD;  Location: [name];  Service: General;  Laterality: N/A;   PULMONARY VENOGRAPHY N/A 11/19/2016   Procedure: Pulmonary Venography; IVC filter placement; possible pulmonary thrombectomy;  Surgeon: Jama Cordella MATSU, MD;  Location: [name];  Service: Cardiovascular;  Laterality: N/A;   SACROPLASTY N/A 11/08/2017   Procedure: SACROPLASTY S2;  Surgeon: Kathlynn Sharper, MD;  Location: [name];  Service: Orthopedics;  Laterality: N/A;   SIGMOIDOSCOPY N/A 11/13/2016   Procedure: endoscopic  flexible SIGMOIDOSCOPY;  Surgeon: Wonda Charlie BRAVO, MD;  Location: [name];  Service: General;  Laterality: N/A;   SIGMOIDOSCOPY N/A 05/19/2017   Procedure: ENID MORIN;  Surgeon: Shelva Dunnings, MD;  Location: [name];  Service: General;  Laterality: N/A;   VIDEO BRONCHOSCOPY WITH ENDOBRONCHIAL NAVIGATION N/A 02/23/2021   Procedure: ROBOTIC VIDEO BRONCHOSCOPY WITH ENDOBRONCHIAL NAVIGATION;  Surgeon: Tamea Dedra CROME, MD;  Location: [name];  Service: Pulmonary;  Laterality: N/A;    Allergies: Adhesive [tape], Avapro  [irbesartan ], Morphine  and codeine , Other, and Latex  Medications: Prior to Admission medications  Medication Sig Start Date End Date Taking? Authorizing Provider  albuterol  (VENTOLIN  HFA) 108 (90 Base) MCG/ACT inhaler Inhale 2 puffs into the lungs every 6 (six) hours as needed for wheezing or  shortness of breath. 01/10/24   Fernand Elfreda LABOR, MD  bisacodyl  (DULCOLAX) 10 MG suppository Place 1 suppository (10 mg total) rectally as needed for moderate constipation. 07/06/23   Viviann Pastor, MD  budesonide -formoterol  (SYMBICORT ) 80-4.5 MCG/ACT inhaler Inhale 2 puffs into the lungs 2 (two) times daily. 04/03/24   Khan, Saadat A, MD  candesartan (ATACAND) 16 MG tablet Take 16 mg by mouth in the morning. 08/03/17   [provider]  cholecalciferol  (VITAMIN D ) 25 MCG (1000 UNIT) tablet Take 1,000 Units by mouth in the morning.    [provider]  Cyanocobalamin  (VITAMIN B-12) 5000 MCG TBDP Take 5,000 mcg by mouth in the morning.    [provider]  cyclobenzaprine  (FLEXERIL ) 10 MG tablet Take 10 mg by mouth at bedtime. 07/05/16   [provider]  dexamethasone  (DECADRON ) 4 MG tablet Take 1 tab by mouth 2 times daily starting day before pemetrexed. Then take 2 tabs daily x 3 days starting day after chemo. Take with food. 06/21/24   Melanee Annah BROCKS, MD  Dulaglutide (TRULICITY) 1.5 MG/0.5ML SOAJ Inject 1.5 mg into the skin once a week. Saturday 07/28/23 07/27/24  [provider]  folic acid  (FOLVITE ) 1 MG tablet Take 1 tablet (1 mg total) by mouth daily. Start 7 days before pemetrexed chemotherapy. Continue until 21 days after pemetrexed completed. 06/21/24   Melanee Annah BROCKS, MD  gabapentin  (NEURONTIN ) 100 MG capsule Take 200 mg by mouth with breakfast, with lunch, and with evening meal.    [provider]  gabapentin  (NEURONTIN ) 300 MG capsule Take 300 mg by mouth at bedtime.    [provider]  glipiZIDE  (GLUCOTROL ) 10 MG tablet Take 10 mg by mouth daily before breakfast.    [provider]  HYDROcodone  bit-homatropine (HYCODAN) 5-1.5 MG/5ML syrup Take 5 mLs by mouth every 8 (eight) hours as needed for cough. 04/03/24   Melanee Annah BROCKS, MD  lidocaine -prilocaine  (EMLA ) cream Apply to affected area once 06/21/24   Melanee Annah BROCKS, MD  LORazepam  (ATIVAN ) 0.5 MG tablet Take 1 tablet by mouth 30 minutes prior to MRI scan. May take additional tablet if needed. 04/04/24   Melanee Annah BROCKS, MD  Magnesium  Oxide -Mg Supplement 500 MG TABS Take 1 tablet by mouth at bedtime.    [provider]  mometasone -formoterol  (DULERA) 100-5 MCG/ACT AERO Inhale 2 puffs into the lungs 2 (two) times daily. 04/03/24   Fernand Elfreda LABOR, MD  omeprazole  (PRILOSEC) 20 MG capsule Take 1  capsule (20 mg total) by mouth daily. 08/30/23   Dasie Tinnie MATSU, NP  ondansetron  (ZOFRAN ) 8 MG tablet Take 1 tablet (8 mg total) by mouth every 8 (eight) hours as needed for nausea or vomiting. Start on the third day after chemotherapy. 06/21/24   Melanee Annah BROCKS, MD  [employer] ULTRA TEST test strip  01/25/23   [provider]  osimertinib  mesylate (TAGRISSO ) 40 MG tablet Take 1 tablet (40 mg total) by mouth daily. 06/15/24   Melanee Annah BROCKS, MD  oxybutynin  (DITROPAN ) 5 MG tablet Take 1 tablet by mouth 2 (two) times daily. 02/25/22   [provider]  oxyCODONE  (OXY IR/ROXICODONE ) 5 MG immediate release tablet Take 1 tablet (5 mg total) by mouth every 6 (six) hours as needed for severe pain (pain score 7-10). 06/08/24   Melanee Annah BROCKS, MD  OXYGEN  Inhale 2 L into the lungs.    [provider]  prochlorperazine  (COMPAZINE ) 10 MG tablet Take 1 tablet (10 mg total)  by mouth every 6 (six) hours as needed for nausea or vomiting. 06/21/24   Melanee Annah BROCKS, MD  rOPINIRole  (REQUIP ) 1 MG tablet Take 1 mg by mouth at bedtime. 06/22/23   [provider]  sucralfate  (CARAFATE ) 1 g tablet Take 1 tablet (1 g total) by mouth 3 (three) times daily with meals. Dissolve tablet in 4 tablesppons warm water swish and swallow 30 min before meals. Patient taking differently: Take 1 g by mouth 3 (three) times daily with meals. Dissolve tablet in 4 tablespoons warm water swish and swallow 30 min before meals. 07/22/23 06/21/24  Lenn Aran, MD  warfarin (COUMADIN ) 5 MG tablet Take 5 mg by mouth daily. 06/01/23   [provider]     Family History  Problem Relation Age of Onset   Diabetes Mother    Heart disease Mother    Cancer Mother    Breast cancer Mother        >50   Heart disease Father    Diabetes Sister    Heart disease Sister     Social History   Socioeconomic History   Marital status: Married    Spouse name: Butler   Number of children: 1   Years of education: Not on  file   Highest education level: Not on file  Occupational History   Not on file  Tobacco Use   Smoking status: Never    Passive exposure: Current (husband smokes)   Smokeless tobacco: Never  Vaping Use   Vaping status: Never Used  Substance and Sexual Activity   Alcohol  use: No    Alcohol /week: 0.0 standard drinks of alcohol    Drug use: No   Sexual activity: Never  Other Topics Concern   Not on file  Social History Narrative   Not on file   Social Drivers of Health   Tobacco Use: Medium Risk (06/21/2024)   Patient History    Smoking Tobacco Use: Never    Smokeless Tobacco Use: Never    Passive Exposure: Current  Financial Resource Strain: Low Risk  (05/18/2024)   Received from [facility]   Overall Financial Resource Strain (CARDIA)    Difficulty of Paying Living Expenses: Not hard at all  Food Insecurity: No Food Insecurity (05/18/2024)   Received from [facility]   Epic    Within the past 12 months, you worried that your food would run out before you got the money to buy more.: Never true    Within the past 12 months, the food you bought just didn't last and you didn't have money to get more.: Never true  Transportation Needs: No Transportation Needs (05/18/2024)   Received from [facility] - Transportation    In the past 12 months, has lack of transportation kept you from medical appointments or from getting medications?: No    Lack of Transportation (Non-Medical): No  Physical Activity: Not on file  Stress: Not on file  Social Connections: Moderately Isolated (12/12/2023)   Social Connection and Isolation Panel    Frequency of Communication with Friends and Family: More than three times a week    Frequency of Social Gatherings with Friends and Family: Once a week    Attends Religious Services: Never    Database Administrator or Organizations: No    Attends Banker Meetings: Never     Marital Status: Married  Depression (PHQ2-9): Low Risk (06/21/2024)   Depression (PHQ2-9)  PHQ-2 Score: 0  Alcohol  Screen: Not on file  Housing: Low Risk  (05/18/2024)   Received from [facility]   Epic    In the last 12 months, was there a time when you were not able to pay the mortgage or rent on time?: No    In the past 12 months, how many times have you moved where you were living?: 0    At any time in the past 12 months, were you homeless or living in a shelter (including now)?: No  Utilities: Not At Risk (05/18/2024)   Received from [facility]   Epic    In the past 12 months has the electric, gas, oil, or water company threatened to shut off services in your home?: No  Health Literacy: Not on file     Review of Systems: A 12 point ROS discussed and pertinent positives are indicated in the HPI above.  All other systems are negative.  Vital Signs: BP (!) 85/53   Pulse 97   Temp 98.1 F (36.7 C) (Oral)   Resp (!) 23   Ht 5' 3 (1.6 m)   Wt 157 lb (71.2 kg)   SpO2 98%   BMI 27.81 kg/m   Advance Care Plan: The advanced care place/surrogate decision maker was discussed at the time of visit and the patient did not wish to discuss or was not able to name a surrogate decision maker or provide an advance care plan.  Physical Exam Constitutional:      General: She is not in acute distress.    Appearance: Normal appearance.  HENT:     Mouth/Throat:     Mouth: Mucous membranes are dry.  Cardiovascular:     Rate and Rhythm: Normal rate and regular rhythm.     Pulses: Normal pulses.     Heart sounds: Normal heart sounds.  Pulmonary:     Effort: Pulmonary effort is normal.     Breath sounds: Rhonchi present.  Musculoskeletal:        General: Normal range of motion.     Cervical back: Normal range of motion.  Skin:    General: Skin is warm and dry.  Neurological:     Mental Status: She is alert and oriented to person, place, and time.   Psychiatric:        Mood and Affect: Mood normal.        Behavior: Behavior normal.        Thought Content: Thought content normal.        Judgment: Judgment normal.     Imaging: No results found.  Labs:  CBC: Recent Labs    01/30/24 1455 03/09/24 0924 05/04/24 0845 05/30/24 0932  WBC 4.2 5.2 5.2 5.2  HGB 11.5* 11.8* 12.3 11.4*  HCT 36.0 35.8* 38.6 35.6*  PLT 94* 162 122* 117*    COAGS: Recent Labs    08/02/23 1108 08/02/23 1440 12/18/23 0401 12/19/23 0137 12/20/23 0541 05/04/24 0845  INR 9.8*   < > 2.4* 2.4* 3.0* 1.1  APTT 49*  --   --   --   --   --    < > = values in this interval not displayed.    BMP: Recent Labs    01/30/24 1455 03/09/24 0924 05/04/24 0845 05/30/24 0932  NA 135 137 142 140  K 3.8 4.4 3.9 3.6  CL 102 104 104 104  CO2 24 25 27 25   GLUCOSE 174* 243* 143* 216*  BUN 9 14 13 9   CALCIUM  8.8* 9.0 9.5 9.2  CREATININE 0.86 0.82 0.75 0.90  GFRNONAA >60 >60 >60 >60    LIVER FUNCTION TESTS: Recent Labs    12/11/23 2106 01/25/24 1459 03/09/24 0924 05/30/24 0932  BILITOT 0.6 0.4 0.3 0.3  AST 16 20 19 15   ALT 10 8 10  <5  ALKPHOS 40 39 67 64  PROT 7.5 7.2 8.2* 7.2  ALBUMIN 2.8* 3.3* 3.2* 3.6    TUMOR MARKERS: No results for input(s): AFPTM, CEA, CA199, CHROMGRNA in the last 8760 hours.  Assessment and Plan: Per Dr. Darold progress note dated 06/18/24: [...] Recurrent non-small cell lung cancer with EGFR mutation Disease recurrence and progression confirmed by tumor board, with new pleural and lymph node uptake on PET scan despite ongoing Tagrisso  therapy. Plan to reintroduce chemotherapy to address progression, balancing efficacy and tolerability. Rationale for continuing Tagrisso  with added chemotherapy is to improve disease control. - Continue Tagrisso  at current dose. - Initiate chemotherapy with carboplatin  and pemetrexed (Alimta) every 21 days for four cycles. - After four cycles, discontinue carboplatin  and  continue pemetrexed every 21 days. - Arrange port placement for chemotherapy administration next week. - Schedule first chemotherapy treatment for July 04, 2024 [...].  Patient presents for scheduled Port-A-Cath placement in IR today.  Patient has been NPO since midnight in anticipation of moderate sedation.  All labs and medications are within acceptable parameters.  Allergies reviewed: Adhesive tape (paper tape Ok); Avapro ; Chlorhexidine  wipes (hives); Latex (rash);  Morphine  and Codeine  (N&V).  Patient has undergone several procedures with IR, most recently a L4-L5 kyphoplasty on 12/12. She received moderate sedation for this, with Versed  and Fentanyl  given at that time, without concern. Patient tolerated it well.  Risks and benefits of image guided port-a-catheter placement was discussed with the patient including, but not limited to bleeding, infection, pneumothorax, or fibrin sheath development and need for additional procedures.  All of the patient's questions were answered, patient is agreeable to proceed. Consent signed and in chart.    Thank you for allowing our service to participate in Beyonce Sawatzky 's care.  Electronically Signed: Carlin DELENA Griffon, PA-C   06/28/2024, 1:55 PM      I spent a total of 30 Minutes in face to face in clinical consultation, greater than 50% of which was counseling/coordinating care for  Port-A-Cath placement in assistance with chemotherapy administration, and in treatment of Right lung cancer recurrence.  "

## 2024-06-28 ENCOUNTER — Ambulatory Visit: Admission: RE | Admit: 2024-06-28 | Discharge: 2024-06-28 | Attending: Oncology | Admitting: Radiology

## 2024-06-28 DIAGNOSIS — Z7722 Contact with and (suspected) exposure to environmental tobacco smoke (acute) (chronic): Secondary | ICD-10-CM | POA: Insufficient documentation

## 2024-06-28 DIAGNOSIS — Z803 Family history of malignant neoplasm of breast: Secondary | ICD-10-CM | POA: Insufficient documentation

## 2024-06-28 DIAGNOSIS — C3491 Malignant neoplasm of unspecified part of right bronchus or lung: Secondary | ICD-10-CM | POA: Insufficient documentation

## 2024-06-28 DIAGNOSIS — C3411 Malignant neoplasm of upper lobe, right bronchus or lung: Secondary | ICD-10-CM

## 2024-06-28 DIAGNOSIS — C3490 Malignant neoplasm of unspecified part of unspecified bronchus or lung: Secondary | ICD-10-CM

## 2024-06-28 LAB — GLUCOSE, CAPILLARY: Glucose-Capillary: 97 mg/dL (ref 70–99)

## 2024-06-28 MED ORDER — MIDAZOLAM HCL 2 MG/2ML IJ SOLN
INTRAMUSCULAR | Status: AC
  Filled 2024-06-28: qty 2

## 2024-06-28 MED ORDER — FENTANYL CITRATE (PF) 100 MCG/2ML IJ SOLN
INTRAMUSCULAR | Status: AC | PRN
  Administered 2024-06-28: 50 ug via INTRAVENOUS
  Administered 2024-06-28 (×2): 25 ug via INTRAVENOUS

## 2024-06-28 MED ORDER — SODIUM CHLORIDE 0.9 % IV SOLN: INTRAVENOUS | Status: DC

## 2024-06-28 MED ORDER — MIDAZOLAM HCL (PF) 2 MG/2ML IJ SOLN
INTRAMUSCULAR | Status: AC | PRN
  Administered 2024-06-28 (×2): .5 mg via INTRAVENOUS
  Administered 2024-06-28: 1 mg via INTRAVENOUS

## 2024-06-28 MED ORDER — FENTANYL CITRATE (PF) 100 MCG/2ML IJ SOLN
INTRAMUSCULAR | Status: AC
  Filled 2024-06-28: qty 2

## 2024-06-28 MED ORDER — LIDOCAINE-EPINEPHRINE 1 %-1:100000 IJ SOLN
INTRAMUSCULAR | Status: AC
  Filled 2024-06-28: qty 1

## 2024-06-28 MED ORDER — LIDOCAINE HCL 1 % IJ SOLN
INTRAMUSCULAR | Status: AC
  Filled 2024-06-28: qty 20

## 2024-06-28 MED ORDER — HEPARIN SOD (PORK) LOCK FLUSH 100 UNIT/ML IV SOLN
500.0000 [IU] | Freq: Once | INTRAVENOUS | Status: AC
  Administered 2024-06-28: 500 [IU] via INTRAVENOUS

## 2024-06-28 MED ORDER — HEPARIN SOD (PORK) LOCK FLUSH 100 UNIT/ML IV SOLN
INTRAVENOUS | Status: AC
  Filled 2024-06-28: qty 5

## 2024-06-28 MED ORDER — LIDOCAINE HCL 1 % IJ SOLN
10.0000 mL | Freq: Once | INTRAMUSCULAR | Status: AC
  Administered 2024-06-28: 10 mL via INTRADERMAL

## 2024-06-28 MED ORDER — LIDOCAINE-EPINEPHRINE 1 %-1:100000 IJ SOLN
10.0000 mL | Freq: Once | INTRAMUSCULAR | Status: AC
  Administered 2024-06-28: 10 mL via INTRADERMAL

## 2024-06-28 NOTE — Progress Notes (Signed)
 Patient has remained clinically stable post procedure. Discharge instructions given to patient and friend. Denies complaints at this time. Patient knows to continue all meds especially coumadin  per Dr Philip,.

## 2024-06-28 NOTE — Procedures (Signed)
 Interventional Radiology Procedure:   Indications: Lung cancer  Procedure: Port placement  Findings: Right jugular port, tip at SVC/RA junction  Complications: None     EBL: Minimal, less than 10 ml  Plan: Discharge in one hour.  Keep port site and incisions dry for at least 24 hours.     Shavy Beachem R. Lowella Dandy, MD  Pager: [phone]

## 2024-06-29 NOTE — Progress Notes (Unsigned)
 Pharmacist Chemotherapy Monitoring - Initial Assessment    Anticipated start date: 07/04/24   The following has been reviewed per standard work regarding the patient's treatment regimen: The patient's diagnosis, treatment plan and drug doses, and organ/hematologic function Lab orders and baseline tests specific to treatment regimen  The treatment plan start date, drug sequencing, and pre-medications Prior authorization status  Patient's documented medication list, including drug-drug interaction screen and prescriptions for anti-emetics and supportive care specific to the treatment regimen The drug concentrations, fluid compatibility, administration routes, and timing of the medications to be used The patient's access for treatment and lifetime cumulative dose history, if applicable  The patient's medication allergies and previous infusion related reactions, if applicable  Recurrent non-small cell lung cancer with EGFR mutation  - Continue Tagrisso  at current dose.  nitiate chemotherapy with carboplatin  and pemetrexed (Alimta) every 21 days for four cycles.  Changes made to treatment plan:  N/A  Follow up needed:  N/A   Kara [name], Kara [name], 06/29/2024  1:14 PM

## 2024-07-04 ENCOUNTER — Inpatient Hospital Stay

## 2024-07-04 ENCOUNTER — Inpatient Hospital Stay: Admitting: Oncology

## 2024-07-26 ENCOUNTER — Ambulatory Visit: Admitting: Radiation Oncology

## 2024-12-03 ENCOUNTER — Ambulatory Visit: Admitting: Internal Medicine

## 2025-03-06 ENCOUNTER — Encounter: Admitting: Internal Medicine
